# Patient Record
Sex: Female | Born: 1990 | Race: White | Hispanic: No | Marital: Married | Smoking: Former smoker
Health system: Southern US, Community
[De-identification: ages and names within clinical notes are randomized; demographics above are authoritative.]

## PROBLEM LIST (undated history)

## (undated) ENCOUNTER — Emergency Department (HOSPITAL_COMMUNITY): Admission: EM | Disposition: A | Discharge status: Home / Self Care | Payer: Medicaid Other

## (undated) ENCOUNTER — Ambulatory Visit: Admission: EM | Payer: Medicaid Other | Source: Home / Self Care

## (undated) DIAGNOSIS — I10 Essential (primary) hypertension: Secondary | ICD-10-CM

## (undated) DIAGNOSIS — R443 Hallucinations, unspecified: Secondary | ICD-10-CM

## (undated) DIAGNOSIS — R87610 Atypical squamous cells of undetermined significance on cytologic smear of cervix (ASC-US): Secondary | ICD-10-CM

## (undated) DIAGNOSIS — E039 Hypothyroidism, unspecified: Secondary | ICD-10-CM

## (undated) DIAGNOSIS — J45909 Unspecified asthma, uncomplicated: Secondary | ICD-10-CM

## (undated) DIAGNOSIS — T189XXA Foreign body of alimentary tract, part unspecified, initial encounter: Secondary | ICD-10-CM

## (undated) DIAGNOSIS — T17908A Unspecified foreign body in respiratory tract, part unspecified causing other injury, initial encounter: Secondary | ICD-10-CM

## (undated) DIAGNOSIS — M1990 Unspecified osteoarthritis, unspecified site: Secondary | ICD-10-CM

## (undated) DIAGNOSIS — F329 Major depressive disorder, single episode, unspecified: Secondary | ICD-10-CM

## (undated) DIAGNOSIS — E785 Hyperlipidemia, unspecified: Secondary | ICD-10-CM

## (undated) DIAGNOSIS — E221 Hyperprolactinemia: Secondary | ICD-10-CM

## (undated) DIAGNOSIS — F250 Schizoaffective disorder, bipolar type: Secondary | ICD-10-CM

## (undated) DIAGNOSIS — R768 Other specified abnormal immunological findings in serum: Secondary | ICD-10-CM

## (undated) DIAGNOSIS — E079 Disorder of thyroid, unspecified: Secondary | ICD-10-CM

## (undated) DIAGNOSIS — G2401 Drug induced subacute dyskinesia: Secondary | ICD-10-CM

## (undated) DIAGNOSIS — R7689 Other specified abnormal immunological findings in serum: Secondary | ICD-10-CM

## (undated) DIAGNOSIS — K219 Gastro-esophageal reflux disease without esophagitis: Secondary | ICD-10-CM

## (undated) DIAGNOSIS — Z765 Malingerer [conscious simulation]: Secondary | ICD-10-CM

## (undated) DIAGNOSIS — T7840XA Allergy, unspecified, initial encounter: Secondary | ICD-10-CM

## (undated) DIAGNOSIS — F32A Depression, unspecified: Secondary | ICD-10-CM

## (undated) DIAGNOSIS — R1314 Dysphagia, pharyngoesophageal phase: Secondary | ICD-10-CM

## (undated) DIAGNOSIS — E162 Hypoglycemia, unspecified: Secondary | ICD-10-CM

## (undated) DIAGNOSIS — F419 Anxiety disorder, unspecified: Secondary | ICD-10-CM

## (undated) DIAGNOSIS — F4310 Post-traumatic stress disorder, unspecified: Secondary | ICD-10-CM

## (undated) DIAGNOSIS — G43009 Migraine without aura, not intractable, without status migrainosus: Secondary | ICD-10-CM

## (undated) HISTORY — DX: Disorder of thyroid, unspecified: E07.9

## (undated) HISTORY — PX: DIAGNOSTIC LAPAROSCOPY: SUR761

## (undated) HISTORY — DX: Unspecified osteoarthritis, unspecified site: M19.90

## (undated) HISTORY — PX: ABDOMINAL SURGERY: SHX537

## (undated) HISTORY — PX: BREAST LUMPECTOMY: SHX2

## (undated) HISTORY — DX: Unspecified foreign body in respiratory tract, part unspecified causing other injury, initial encounter: T17.908A

## (undated) HISTORY — PX: WISDOM TOOTH EXTRACTION: SHX21

## (undated) HISTORY — PX: APPENDECTOMY: SHX54

## (undated) HISTORY — DX: Allergy, unspecified, initial encounter: T78.40XA

---
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
## 2009-09-24 ENCOUNTER — Emergency Department: Payer: Self-pay | Admitting: Emergency Medicine

## 2009-09-24 IMAGING — CR DG ABDOMEN 1V
1 series · 2 of 2 positions shown · non-contrast
Comparison: none

REASON FOR EXAM: swallow thumb tack this morning
COMMENTS:   LMP: Depo Provera

[Series 1: view not recorded · 0.17mm/px · 2 of 2 slices shown]
[im 1/2]
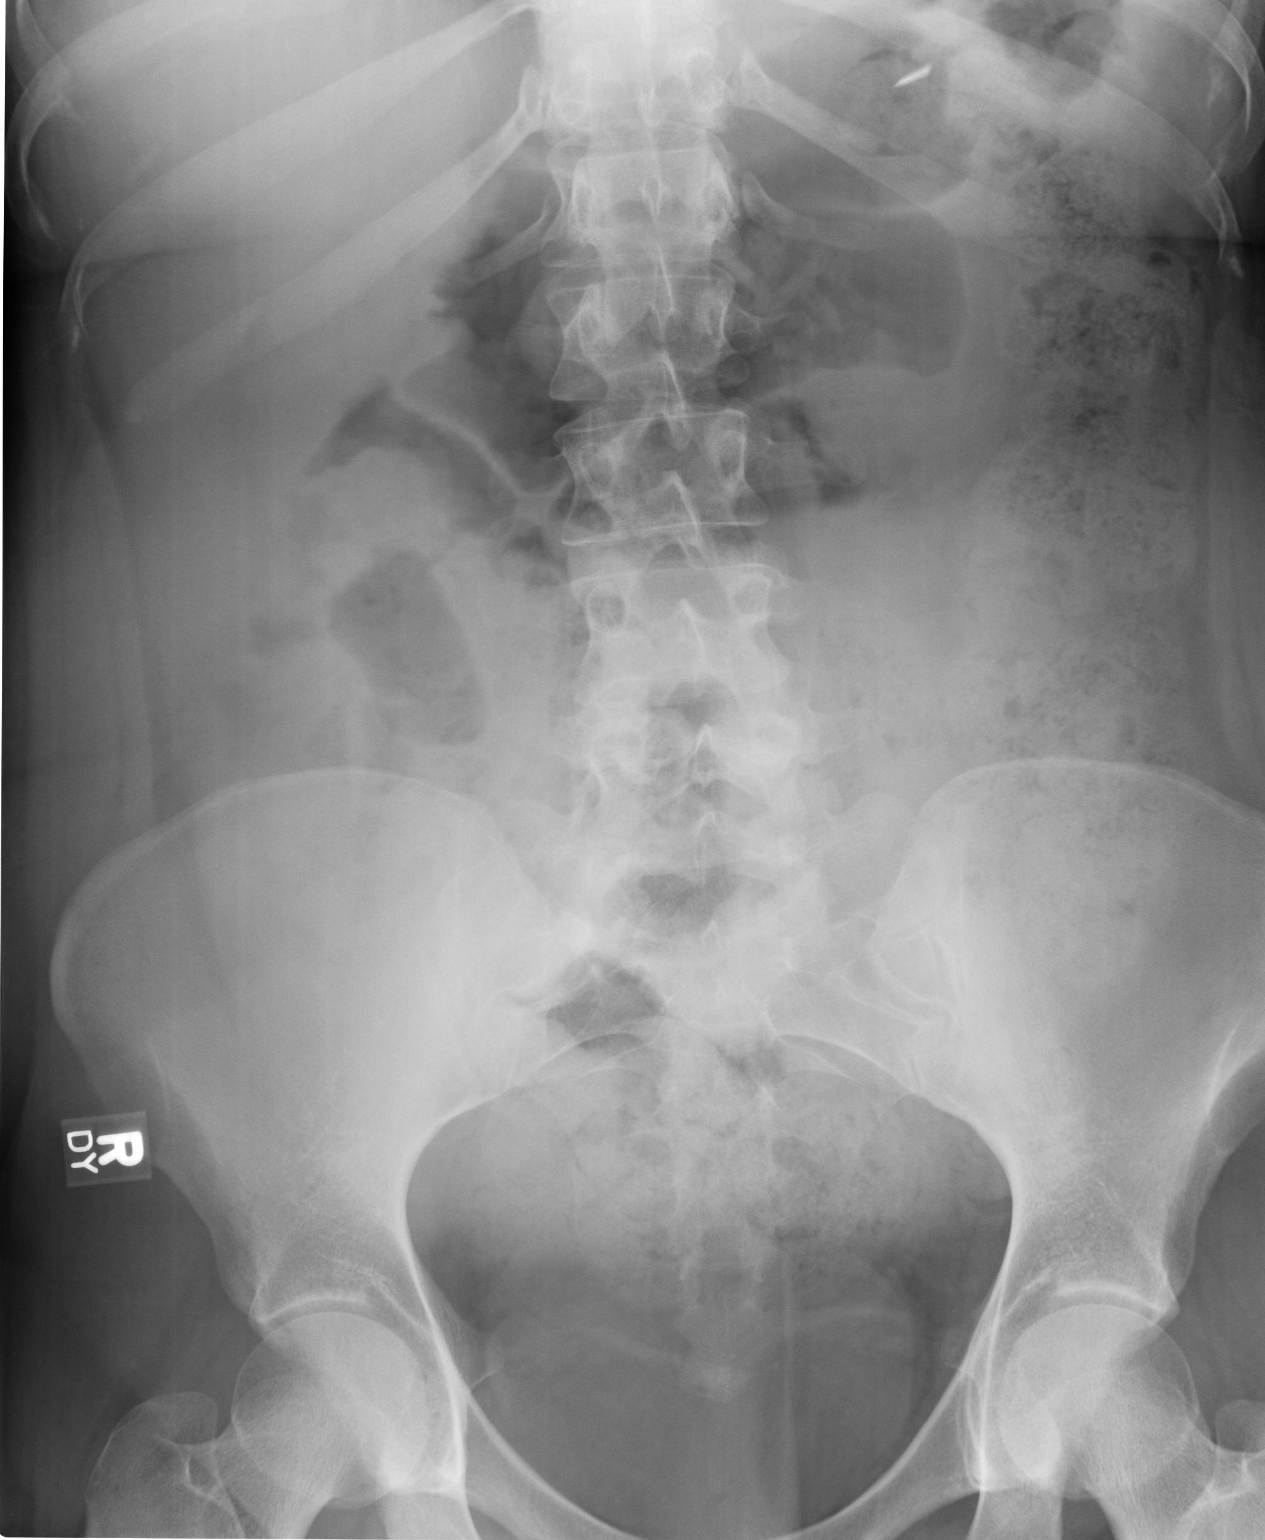
[im 2/2]
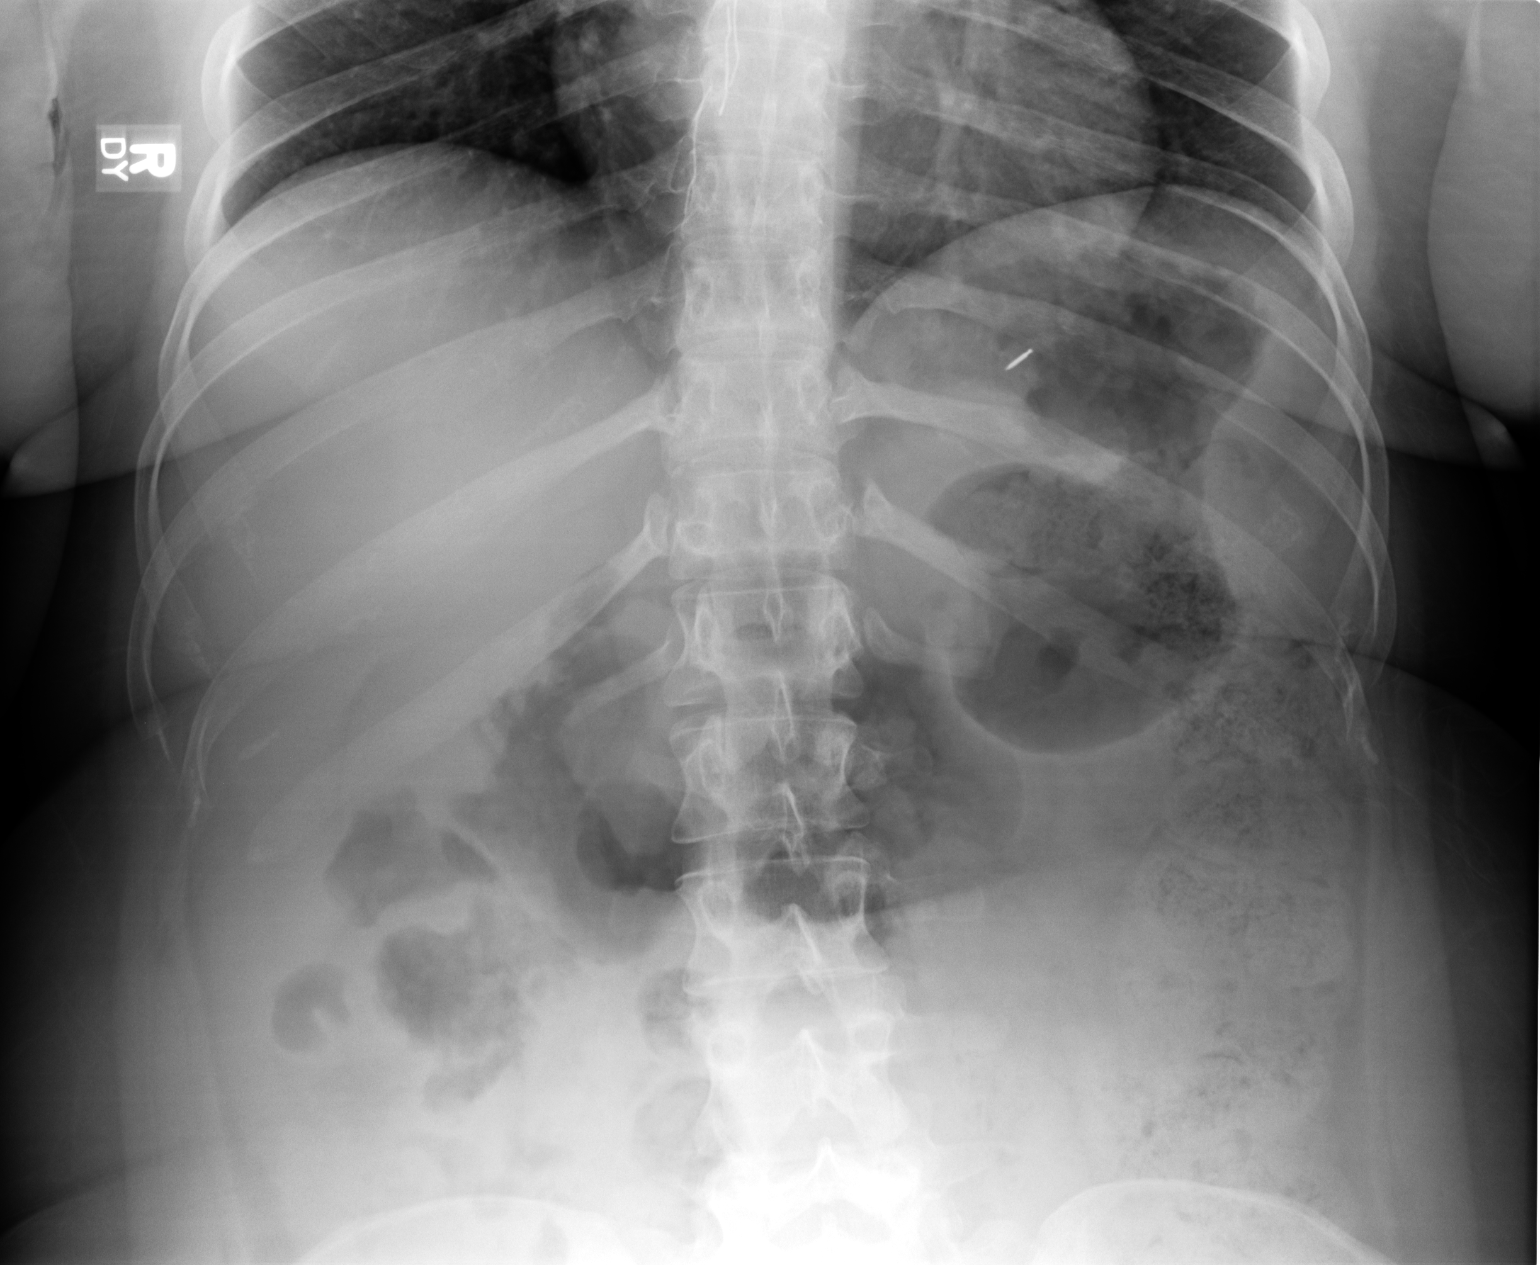

[2 of 2 positions shown; findings below may reference images not displayed]

PROCEDURE:     DXR - DXR KIDNEY URETER BLADDER  - [DATE] [DATE]

RESULT:     An AP view of the abdomen shows a metallic density projected
over the gastric fundus compatible with the clinically reported ingested
foreign body. The bowel gas pattern is normal. There is a moderate amount of
fecal material in the colon. No dilated bowel loops are seen.
IMPRESSION: 1.     There is a metallic opaque object projected over the stomach and
consistent with a tack or tack fragment as mentioned in the clinical history.

## 2009-12-04 ENCOUNTER — Emergency Department: Payer: Self-pay | Admitting: Emergency Medicine

## 2009-12-05 IMAGING — CR DG ABDOMEN 3V
1 series · 3 of 3 positions shown · non-contrast
Comparison: none

REASON FOR EXAM: ingested screw
COMMENTS:

PROCEDURE:     DXR - DXR ABDOMEN 3-WAY (INCL PA CXR)  - [DATE]  [DATE]
RESULT:     Comparisons:  None

[Series 1: view not recorded · 0.17mm/px · 3 of 3 slices shown]
[im 1/3]
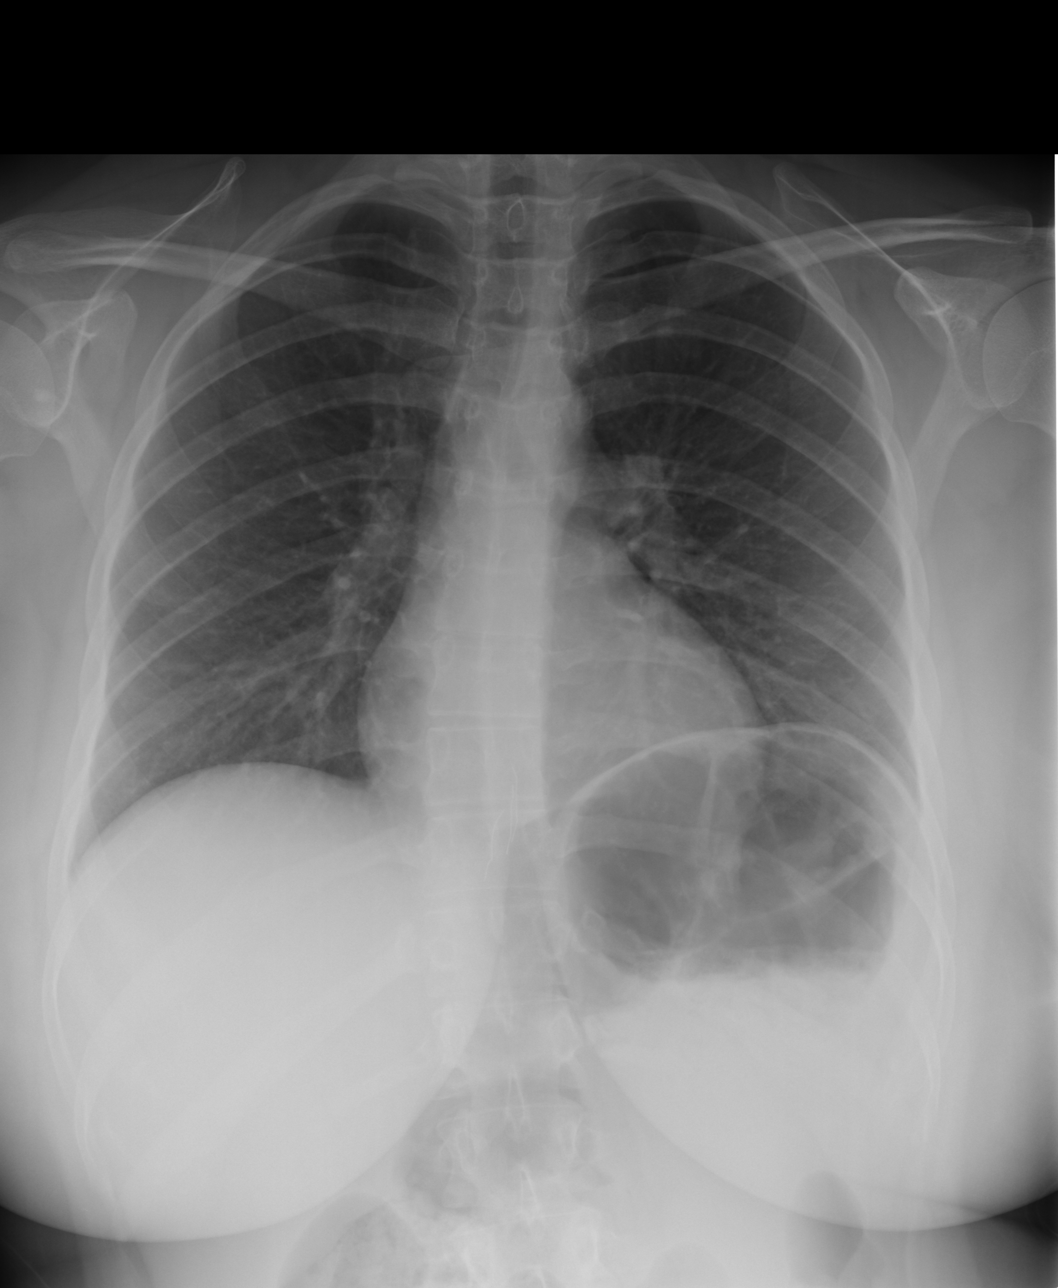
[im 2/3]
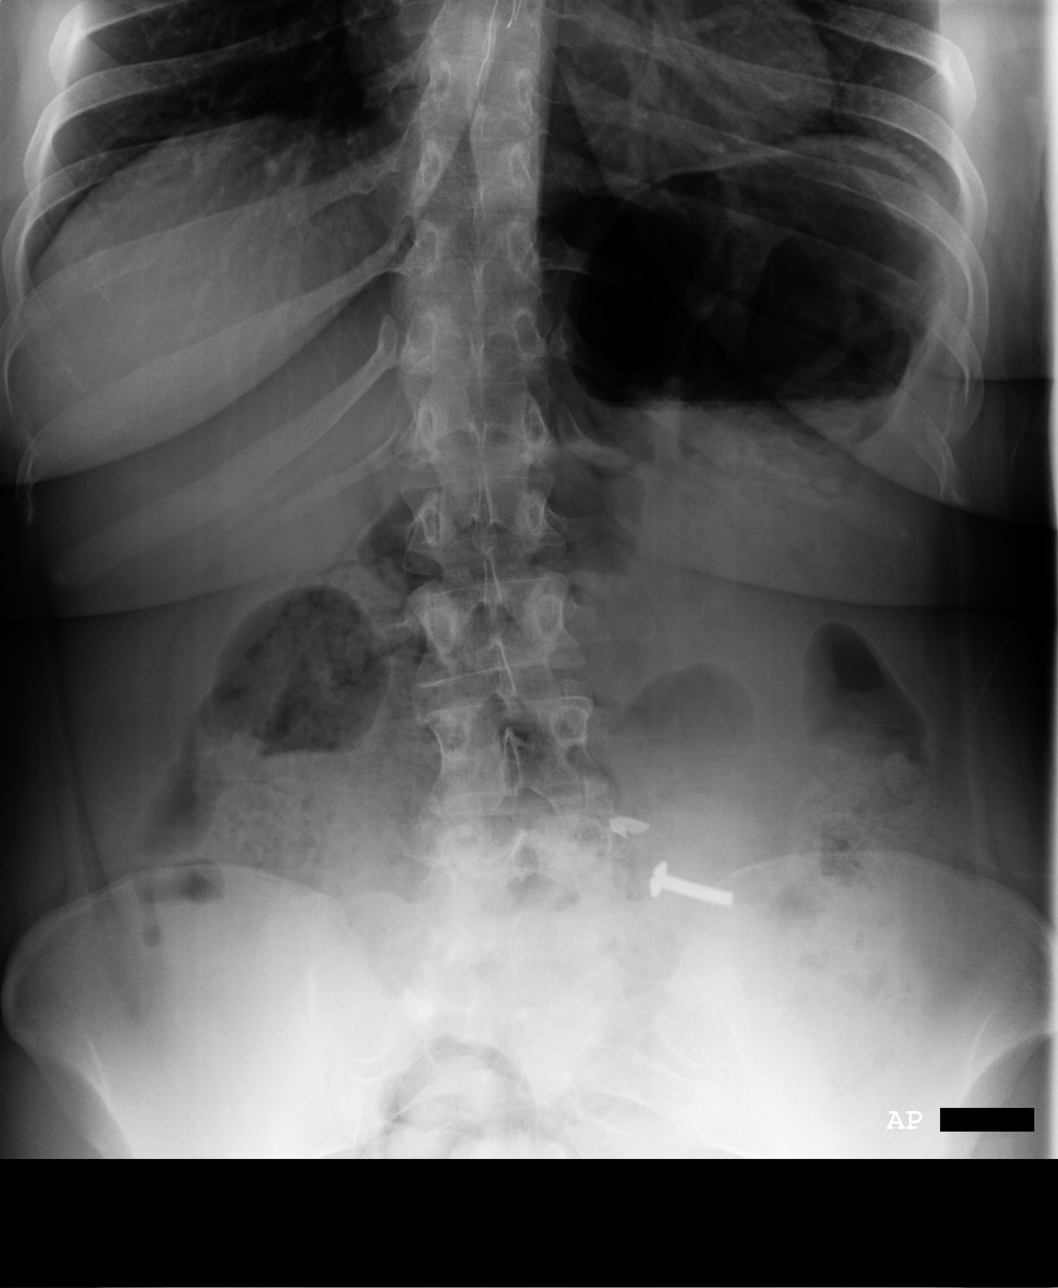
[im 3/3]
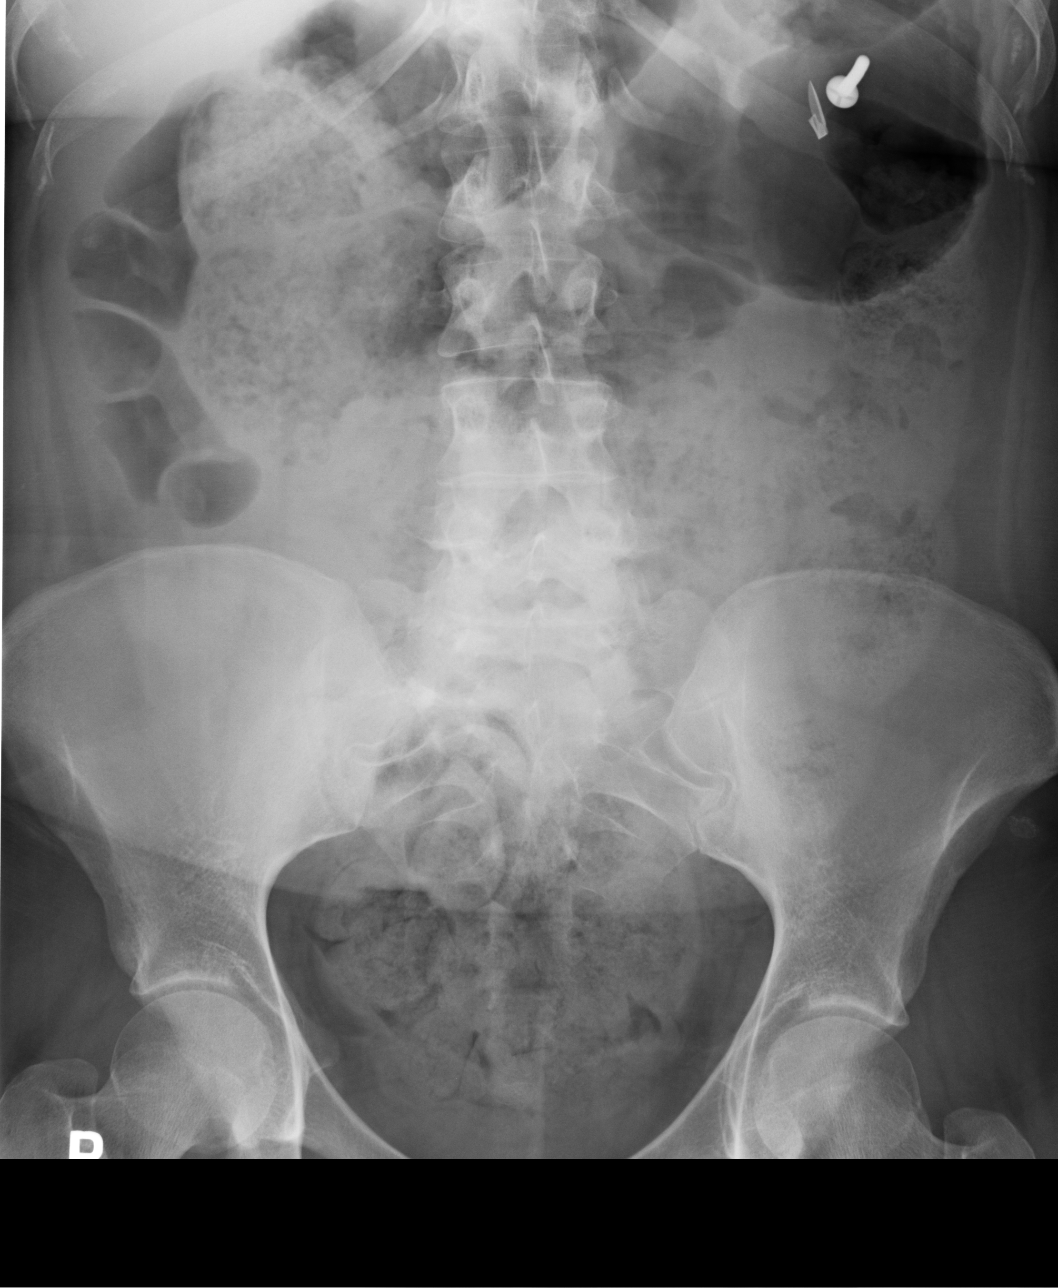

[3 of 3 positions shown; findings below may reference images not displayed]

FINDINGS: PA chest, supine and upright views of the abdomen are provided.

The chest is clear. The heart and mediastinum are unremarkable. The osseous
structures are unremarkable.

There is a nonspecific bowel gas pattern. There is no bowel dilatation to
suggest obstruction. There are no air-fluid levels. There are no dilated
loops, bowel wall thickening, or evidence of mass effect.  Soft tissue
shadows are normal. There is no evidence of pneumoperitoneum, portal venous
gas, or pneumatosis. There is an approximately 2.8 cm long fully threaded
screw in the left lower abdomen on the upright radiograph and in the region
of the splenic flexure on the supine radiograph. Additionally there is a and
metallic foreign body adjacent to the screw measuring 1.9 cm.

Osseous structures are unremarkable.
IMPRESSION: There is an approximately 2.8 cm long fully threaded screw in the left lower
abdomen on the upright radiograph and in the region of the splenic flexure
on the supine radiograph. Additionally there is a and metallic foreign body
adjacent to the screw measuring 1.9 cm.

## 2009-12-29 IMAGING — CR DG ABDOMEN 3V
1 series · 3 of 3 positions shown · non-contrast
Comparison: none

REASON FOR EXAM: swallowed a razor blade
COMMENTS:

[Series 1: view not recorded · 0.17mm/px · 3 of 3 slices shown]
[im 1/3]
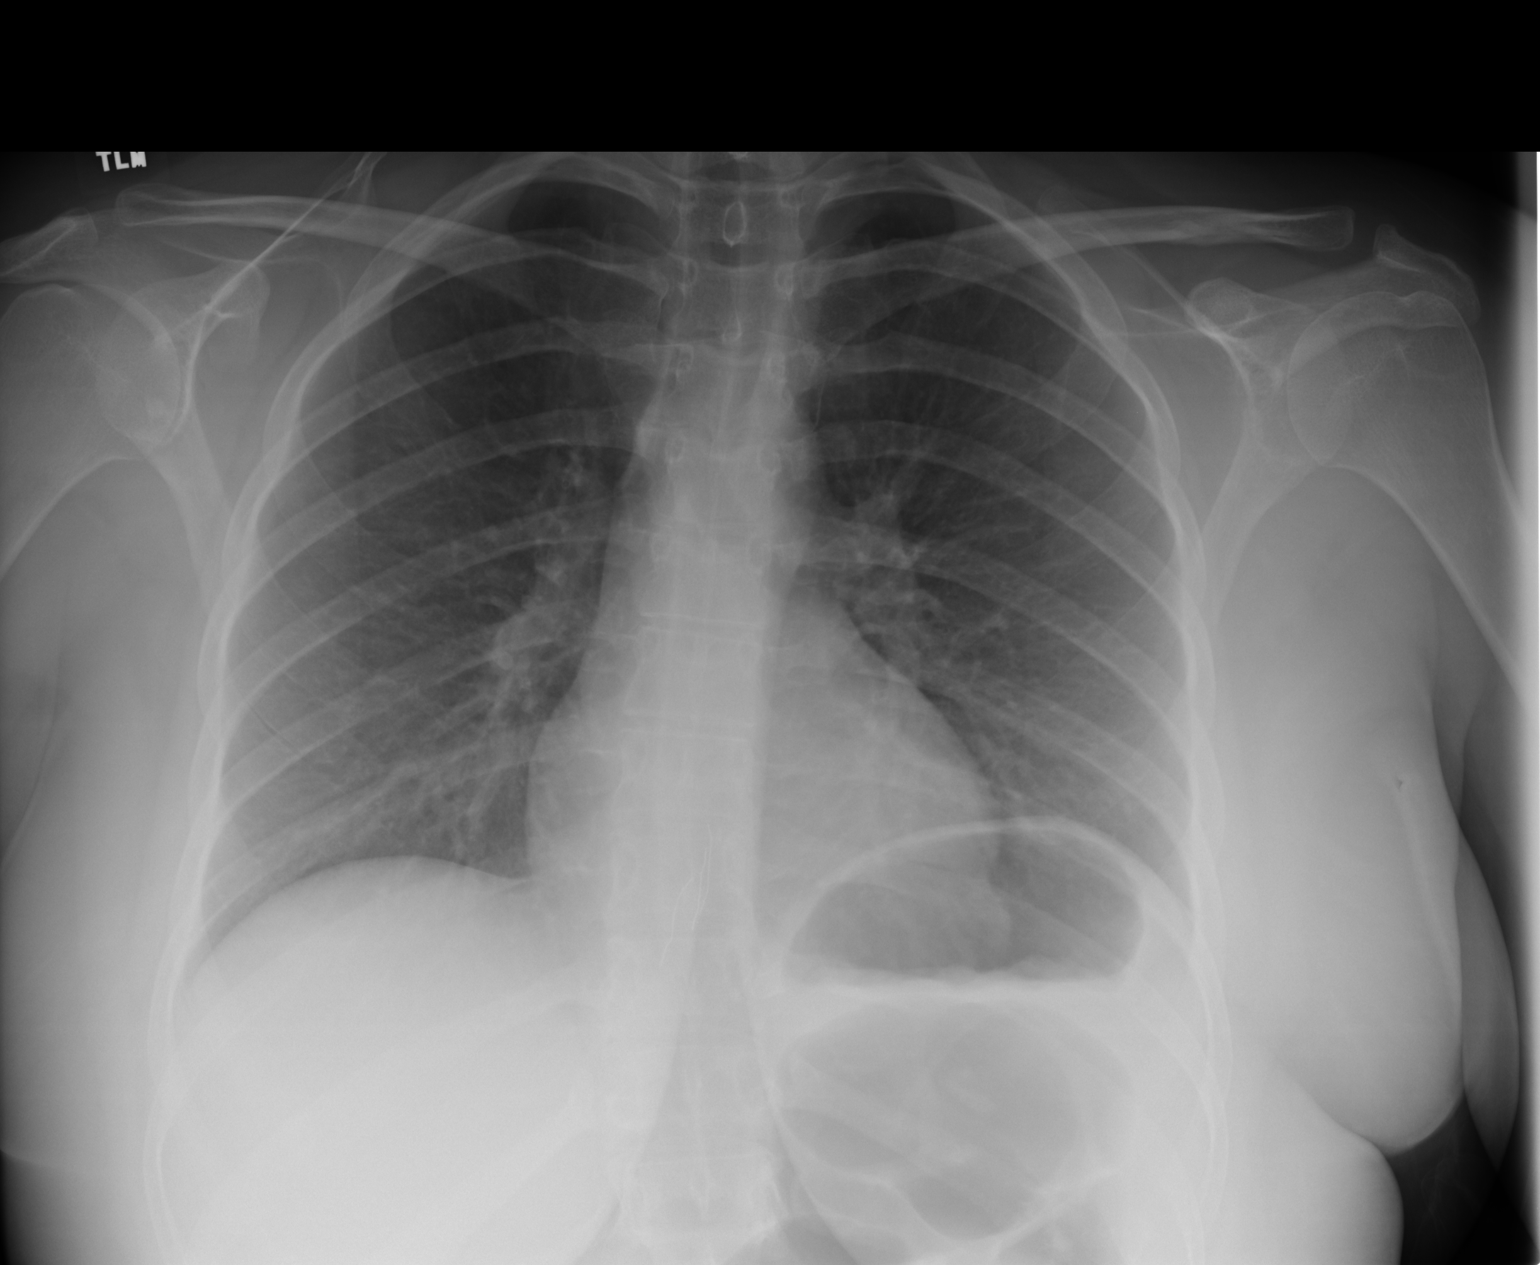
[im 2/3]
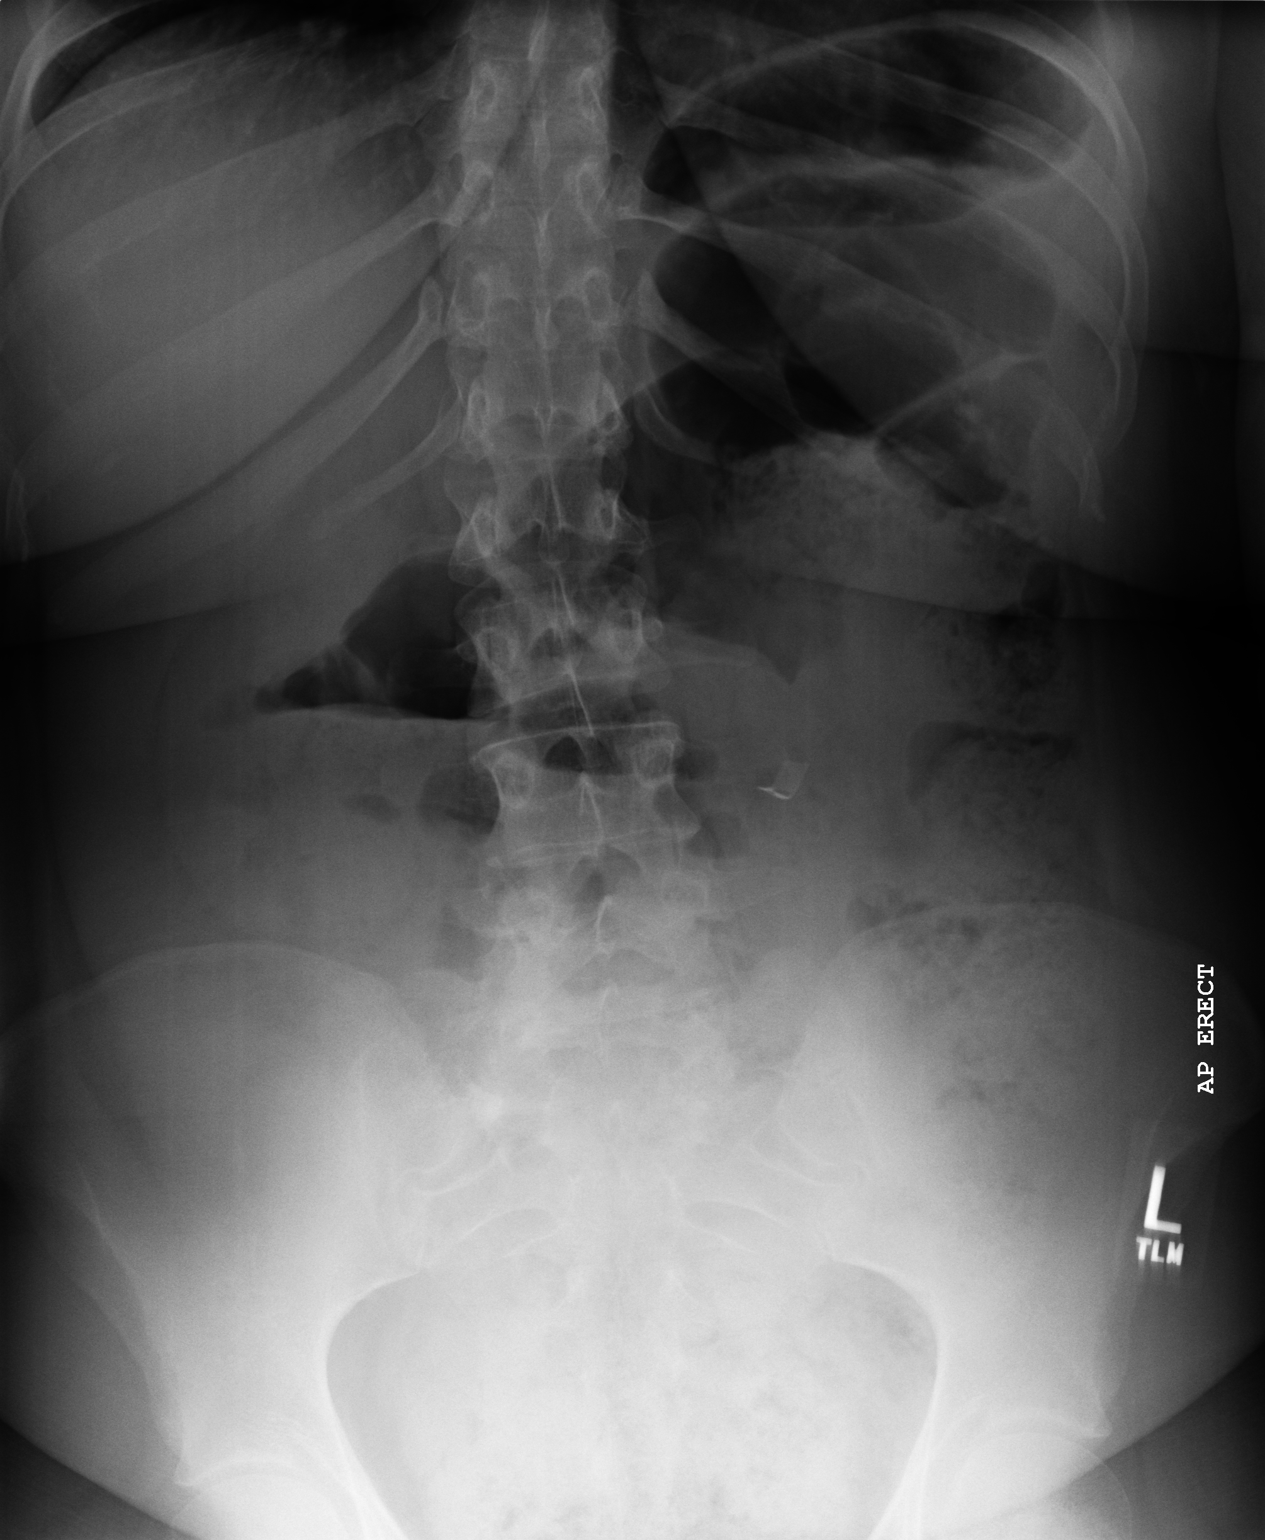
[im 3/3]
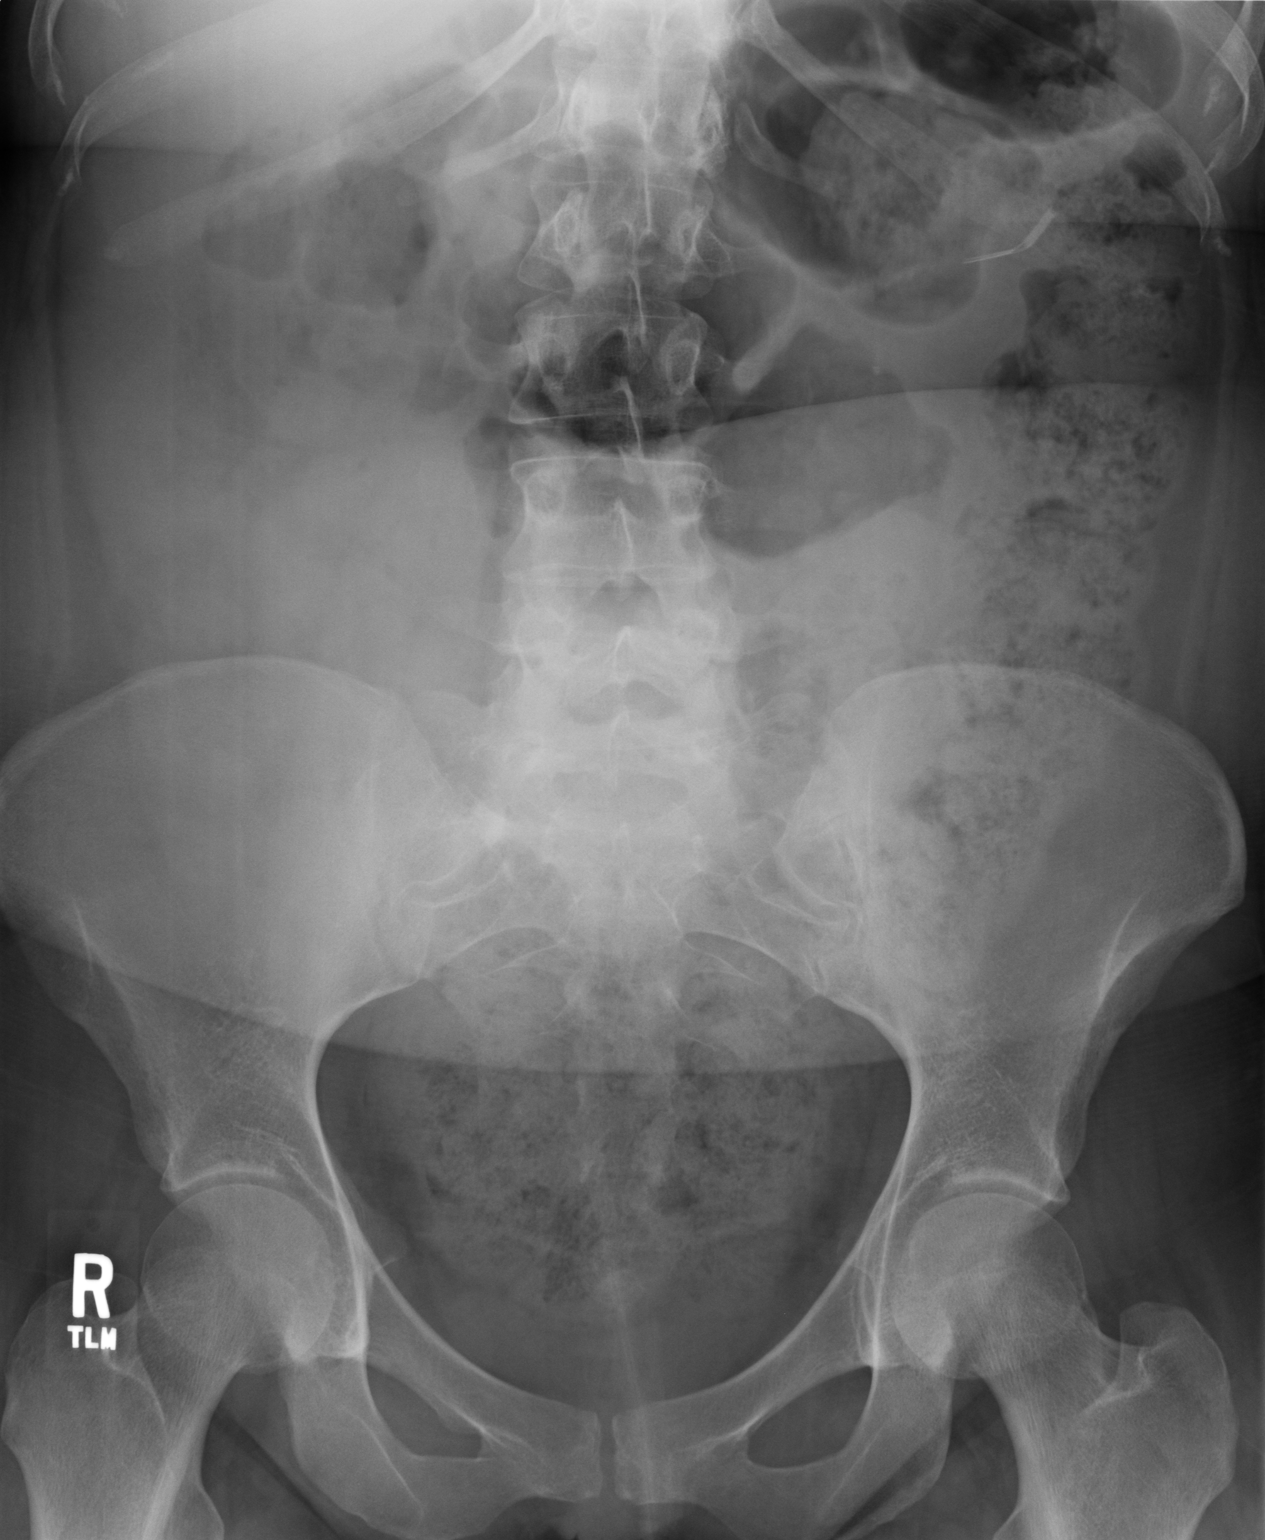

[3 of 3 positions shown; findings below may reference images not displayed]

PROCEDURE:     DXR - DXR ABDOMEN 3-WAY (INCL PA CXR)  - [DATE] [DATE]

RESULT:     Comparison is made to prior the exam [DATE]. PA view of the
chest shows the lung fields to be clear. No subdiaphragmatic free air is
seen. Two views of the abdomen were obtained. The previously observed
metallic screw projected over the left upper quadrant is no longer seen. The
second foreign body persists and in the supine view is again projected at
the level of the splenic flexure.
IMPRESSION: 1. There is a metallic density foreign body projected at the level of the
splenic flexure.
2. The previously present metallic screw is no longer seen.
3. No subdiaphragmatic free air is noted.

## 2009-12-30 ENCOUNTER — Inpatient Hospital Stay: Payer: Self-pay | Admitting: Internal Medicine

## 2009-12-30 IMAGING — CR DG ABDOMEN 3V
1 series · 4 of 4 positions shown · non-contrast
Comparison: none

REASON FOR EXAM: f/u foreign body ingested
COMMENTS:

[Series 1: view not recorded · 0.17mm/px · 4 of 4 slices shown]
[im 1/4]
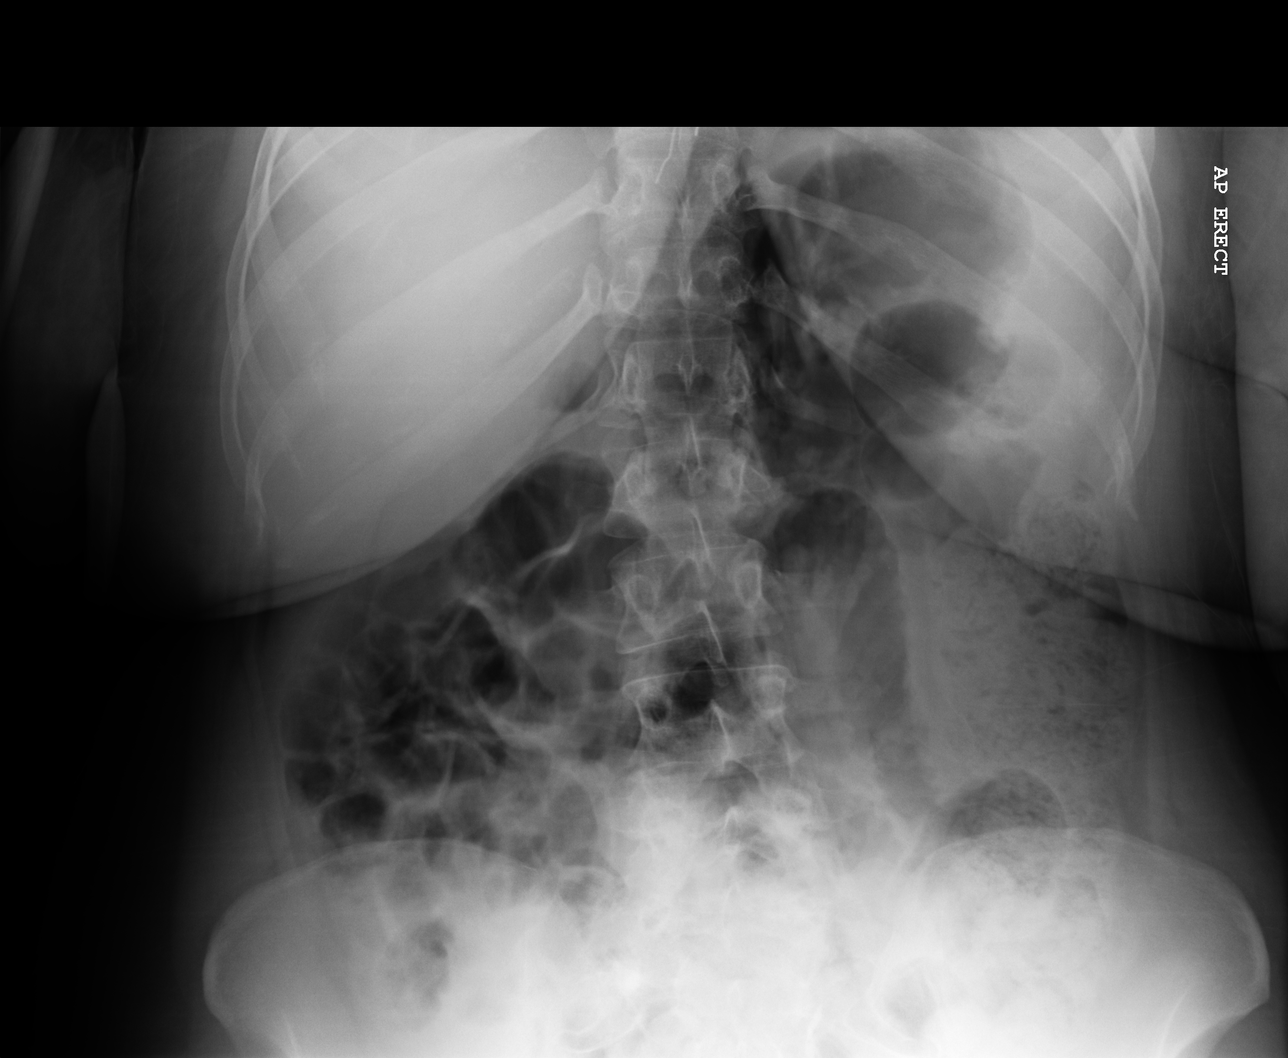
[im 2/4]
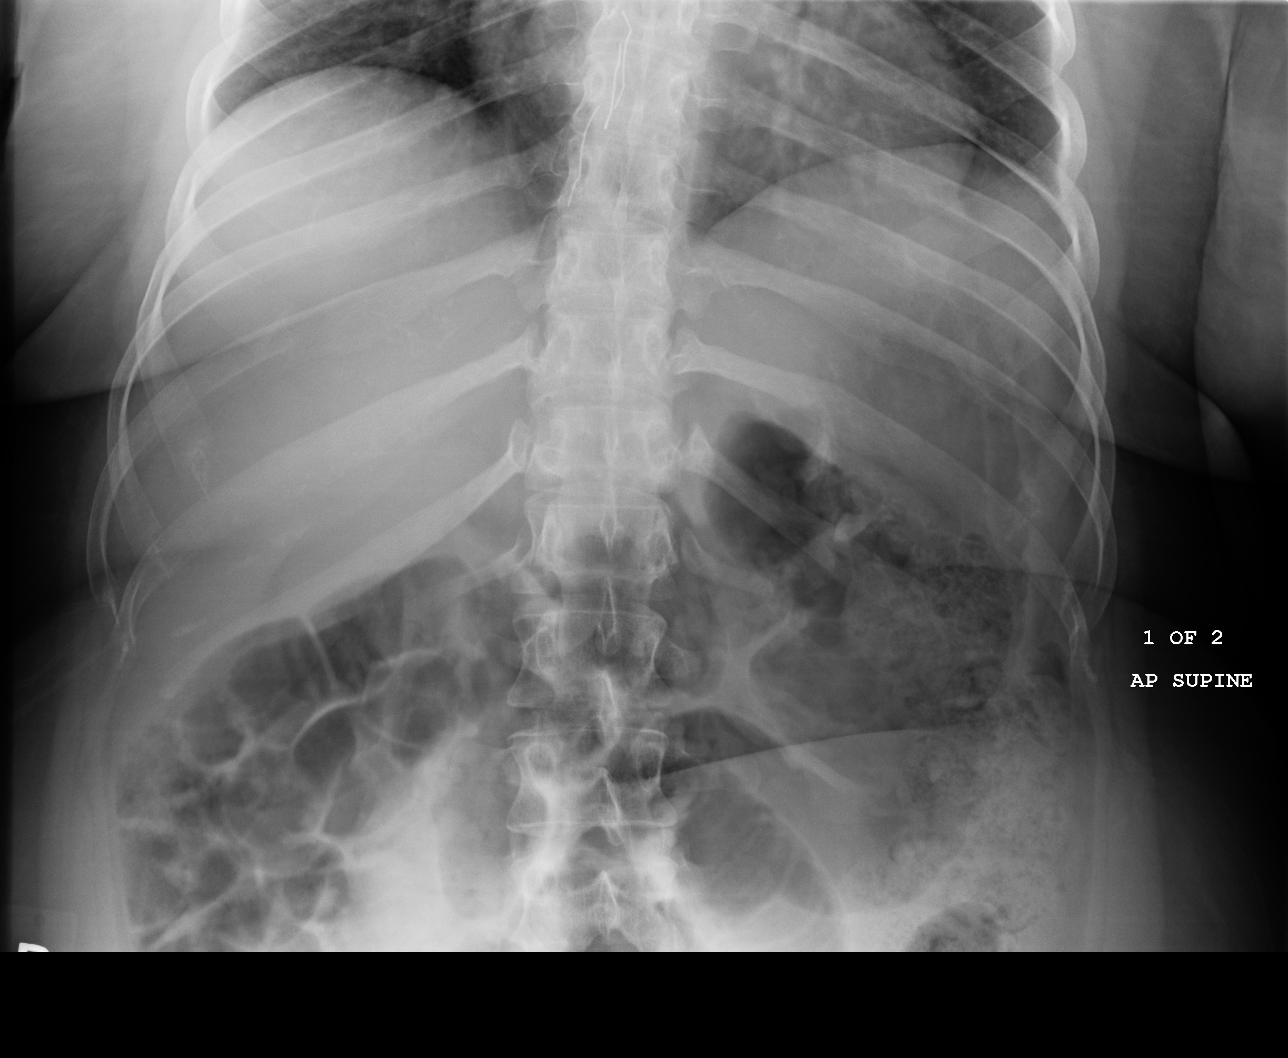
[im 3/4]
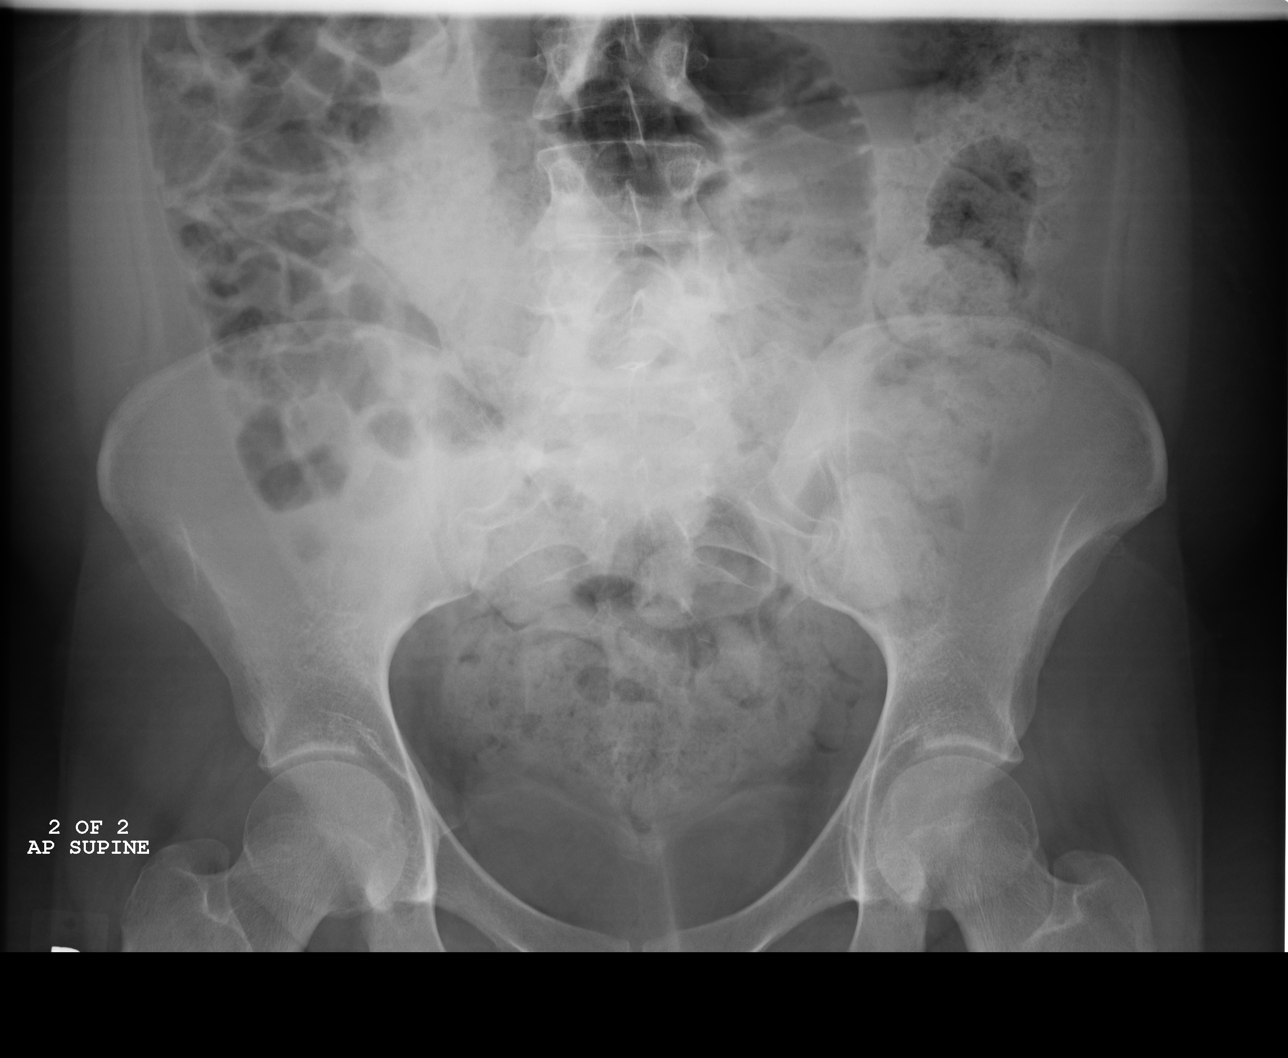
[im 4/4]
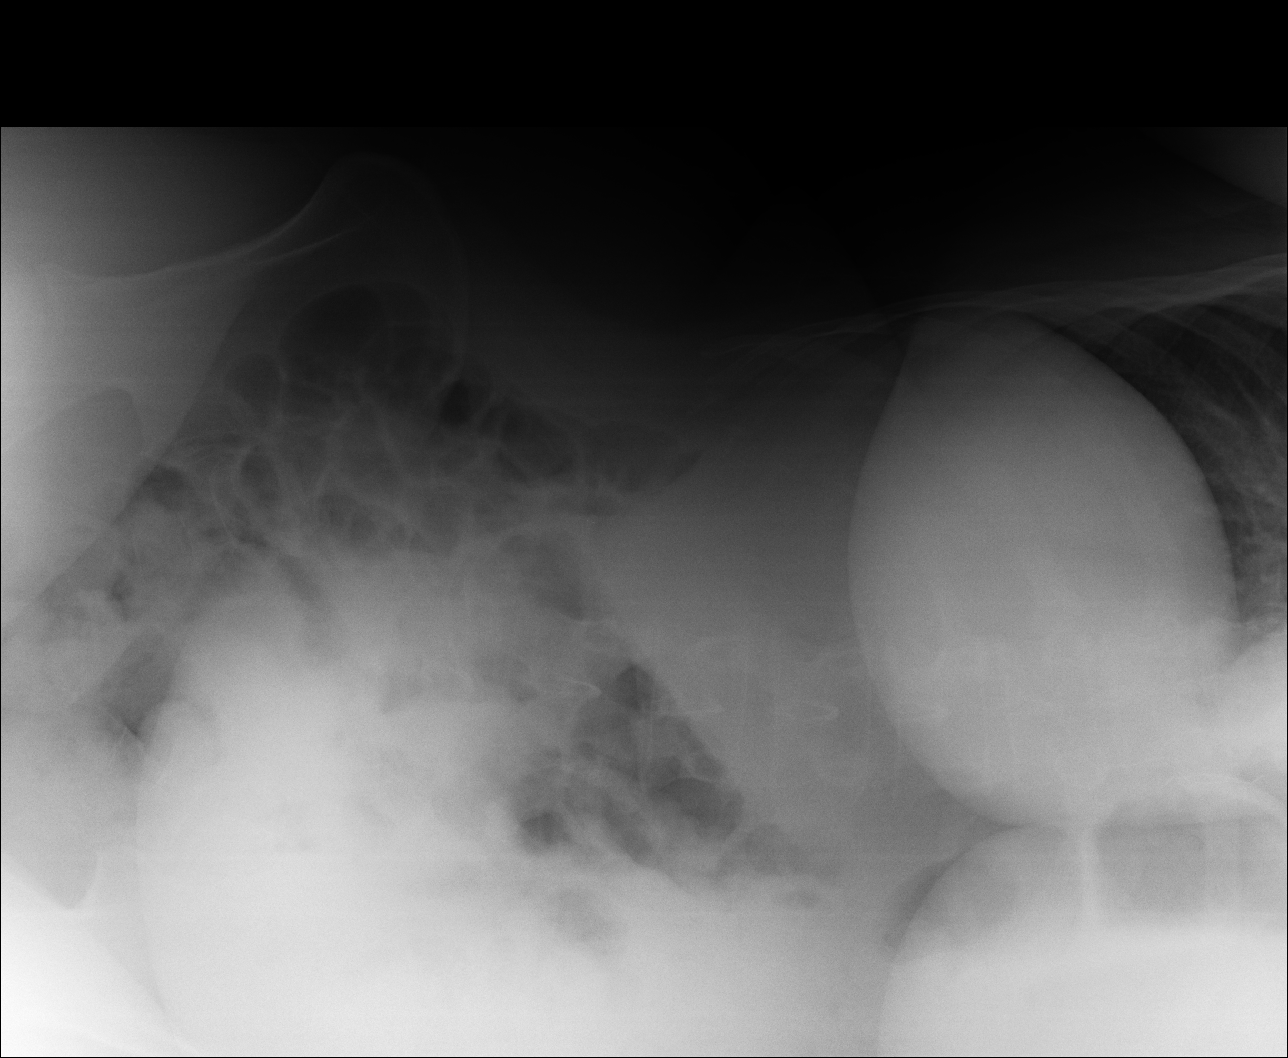

[4 of 4 positions shown; findings below may reference images not displayed]

PROCEDURE:     DXR - DXR ABDOMEN COMPLETE  - [DATE]  [DATE]

RESULT:     No subdiaphragmatic free air is seen. The bowel gas pattern is
normal. No air-fluid levels are seen. The radiopaque foreign body in the GI
tract noted on prior exams is not definitely seen but still may be present
and obscured by bowel and bowel content and respiratory motion. Additional
follow-up is recommended.
IMPRESSION: 1.     The previously noted foreign body is not identified on this exam but
additional follow-up is recommended since the finding could be present and
obscured as noted above.

## 2009-12-30 IMAGING — CR DG ABDOMEN 3V
1 series · 3 of 3 positions shown · non-contrast
Comparison: none

REASON FOR EXAM: foreign body
COMMENTS:

[Series 1: view not recorded · 0.17mm/px · 3 of 3 slices shown]
[im 1/3]
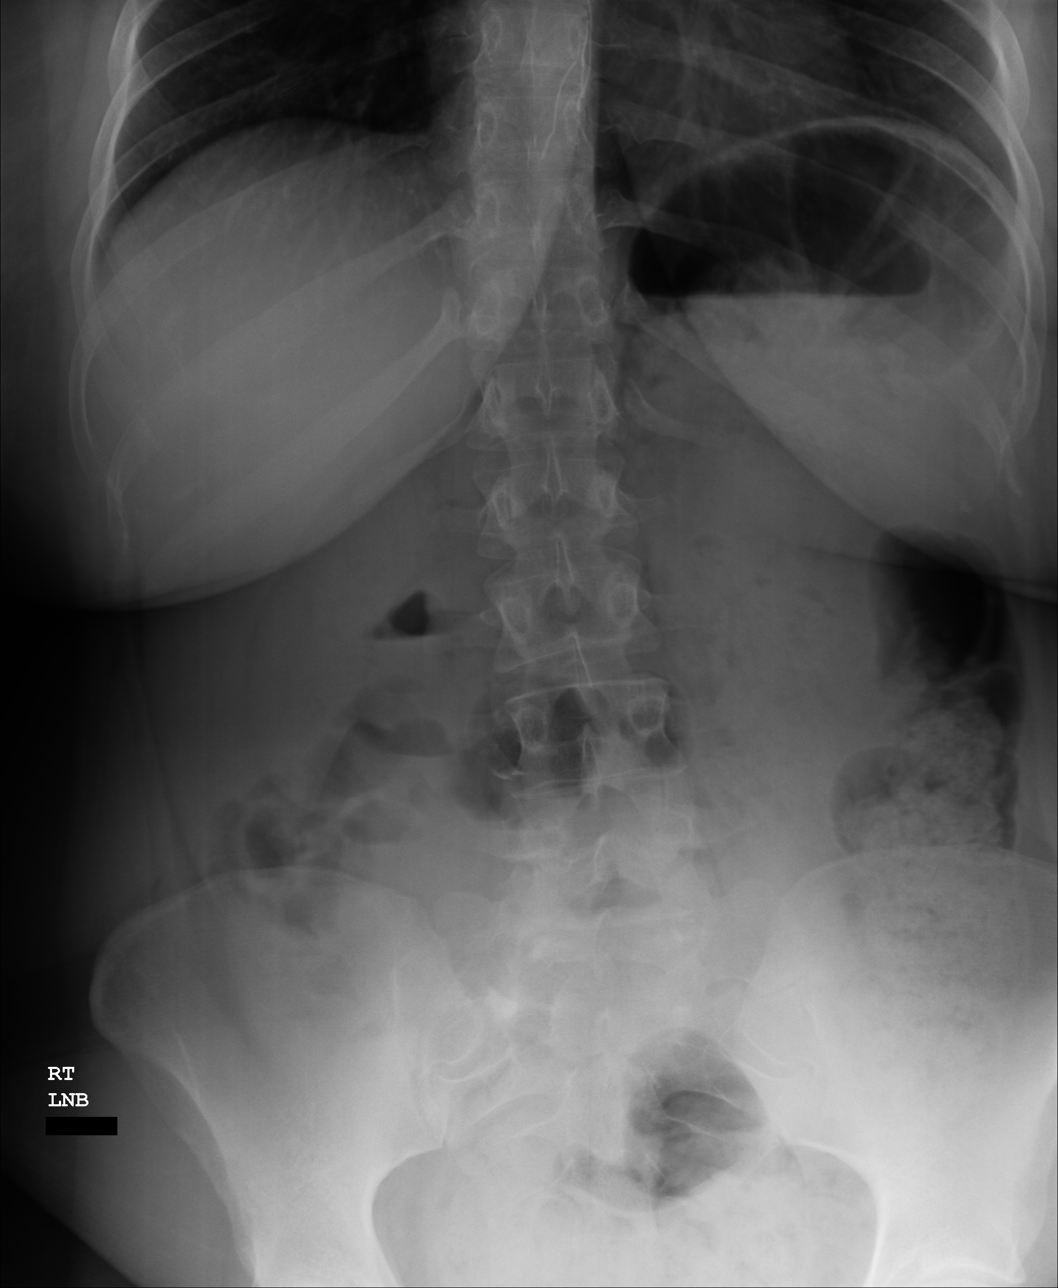
[im 2/3]
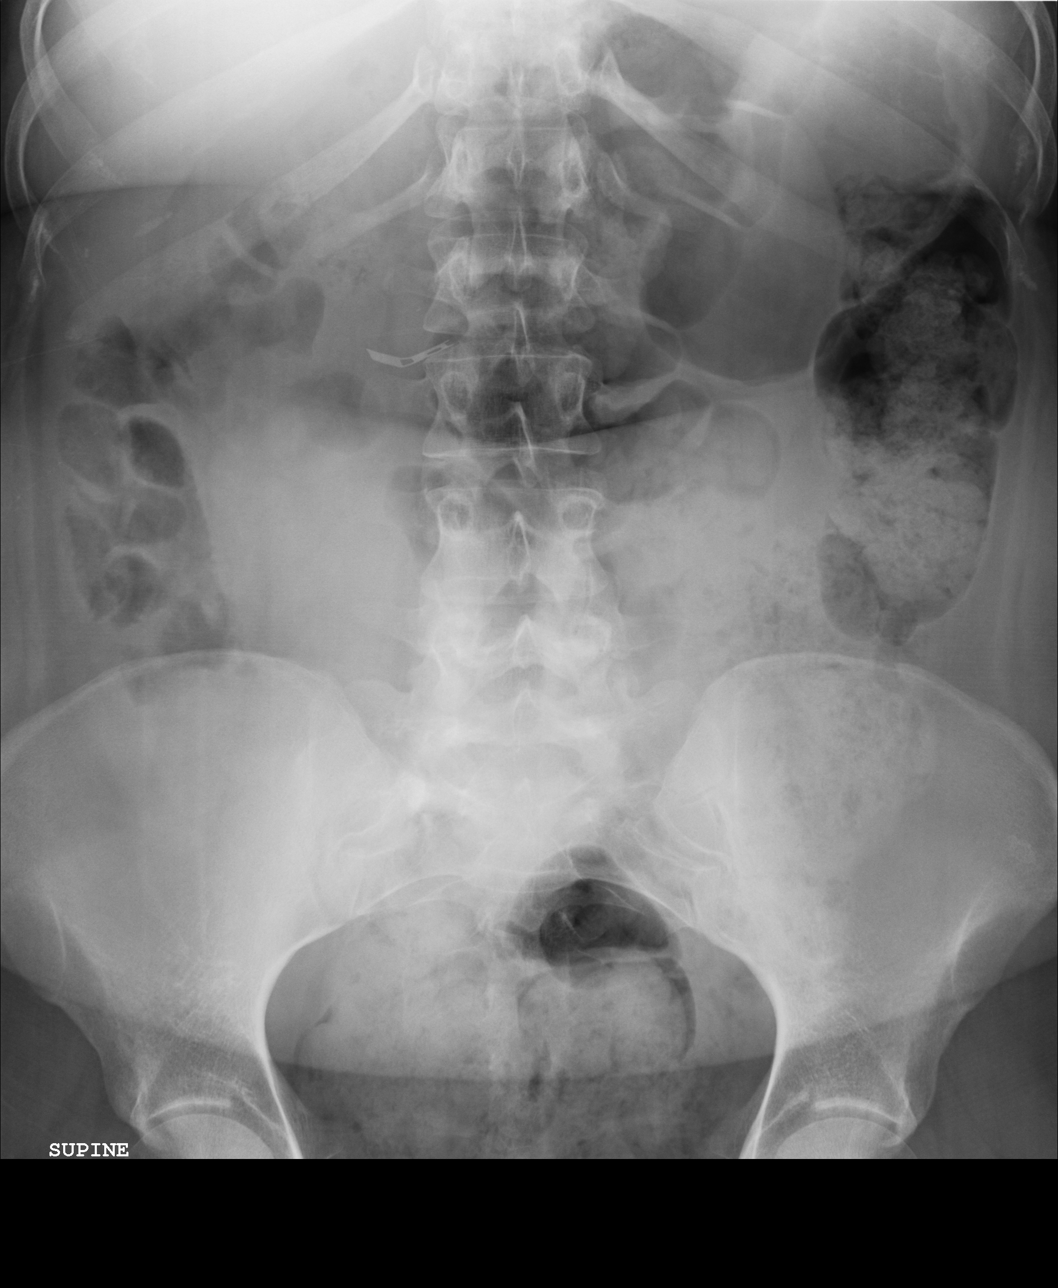
[im 3/3]
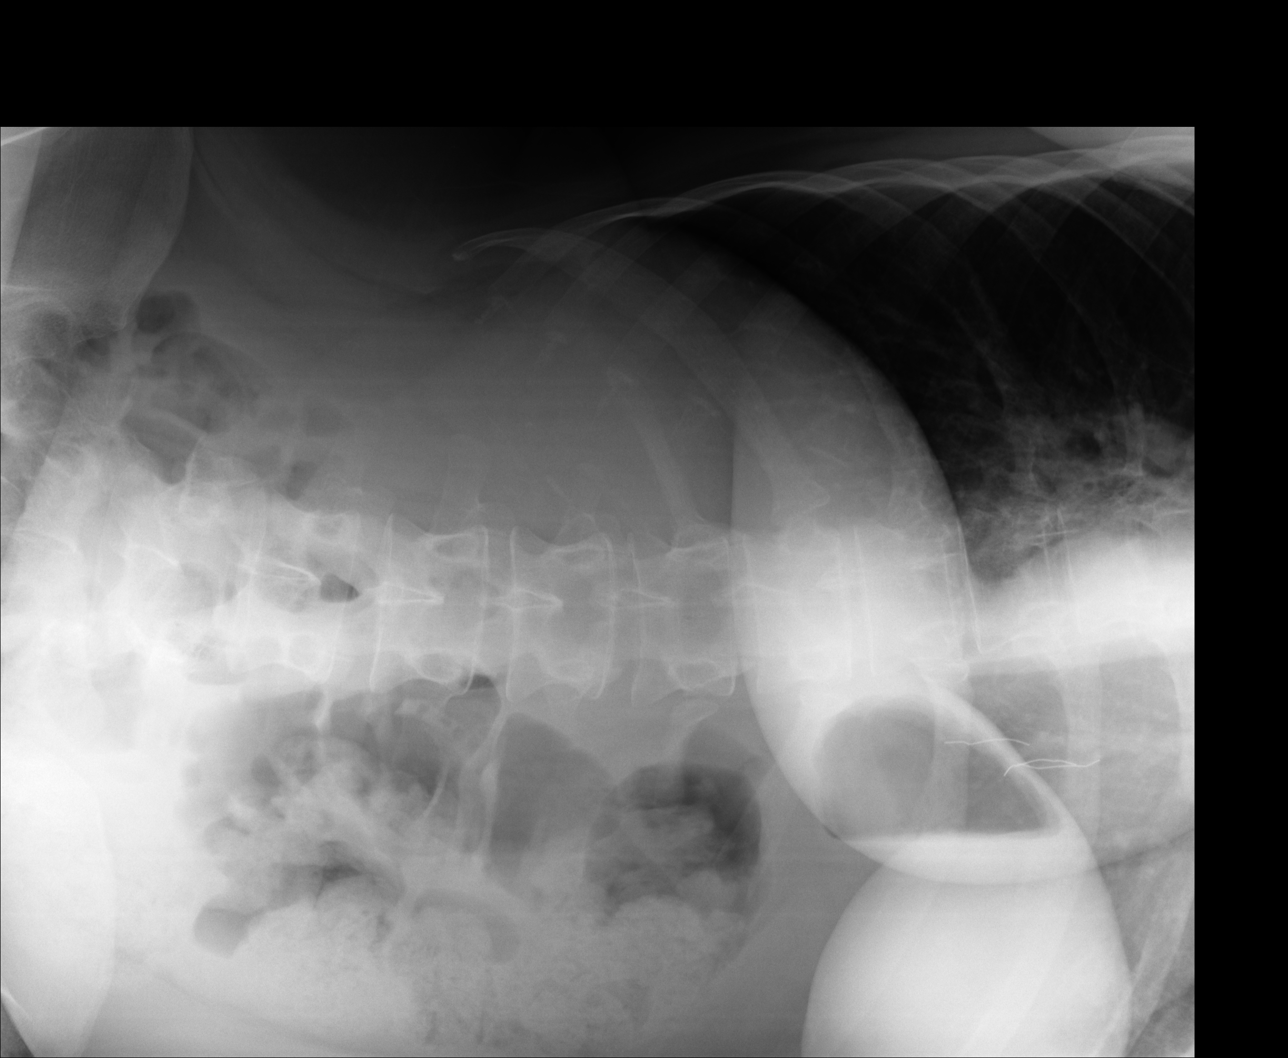

[3 of 3 positions shown; findings below may reference images not displayed]

PROCEDURE:     DXR - DXR ABDOMEN COMPLETE  - [DATE] [DATE]

RESULT:     Flat, erect and lateral decubitus views of the abdomen were
obtained and are compared to the prior exam of yesterday. No
subdiaphragmatic free air is seen. The previously noted metallic foreign
body is again observed. On this examination, the foreign body is projected
over the medial aspect of the right upper quadrant which suggests the
finding is in the stomach or duodenum. The finding was previously in the
left upper quadrant and may have been in the gastric fundus. The metallic
screw previously noted is no longer seen as reported on the exam of
yesterday.
IMPRESSION: 1. The previously noted foreign body is again observed and is currently
located medially in the right upper quadrant.
2. No subdiaphragmatic free air is seen.
3. There is a moderate amount of fecal material in the colon.

## 2009-12-31 IMAGING — CR DG ABDOMEN 3V
1 series · 4 of 4 positions shown · non-contrast
Comparison: none

REASON FOR EXAM: FB ingestion, acute increase in abdominal pain.
COMMENTS:

[Series 1: view not recorded · 0.17mm/px · 4 of 4 slices shown]
[im 1/4]
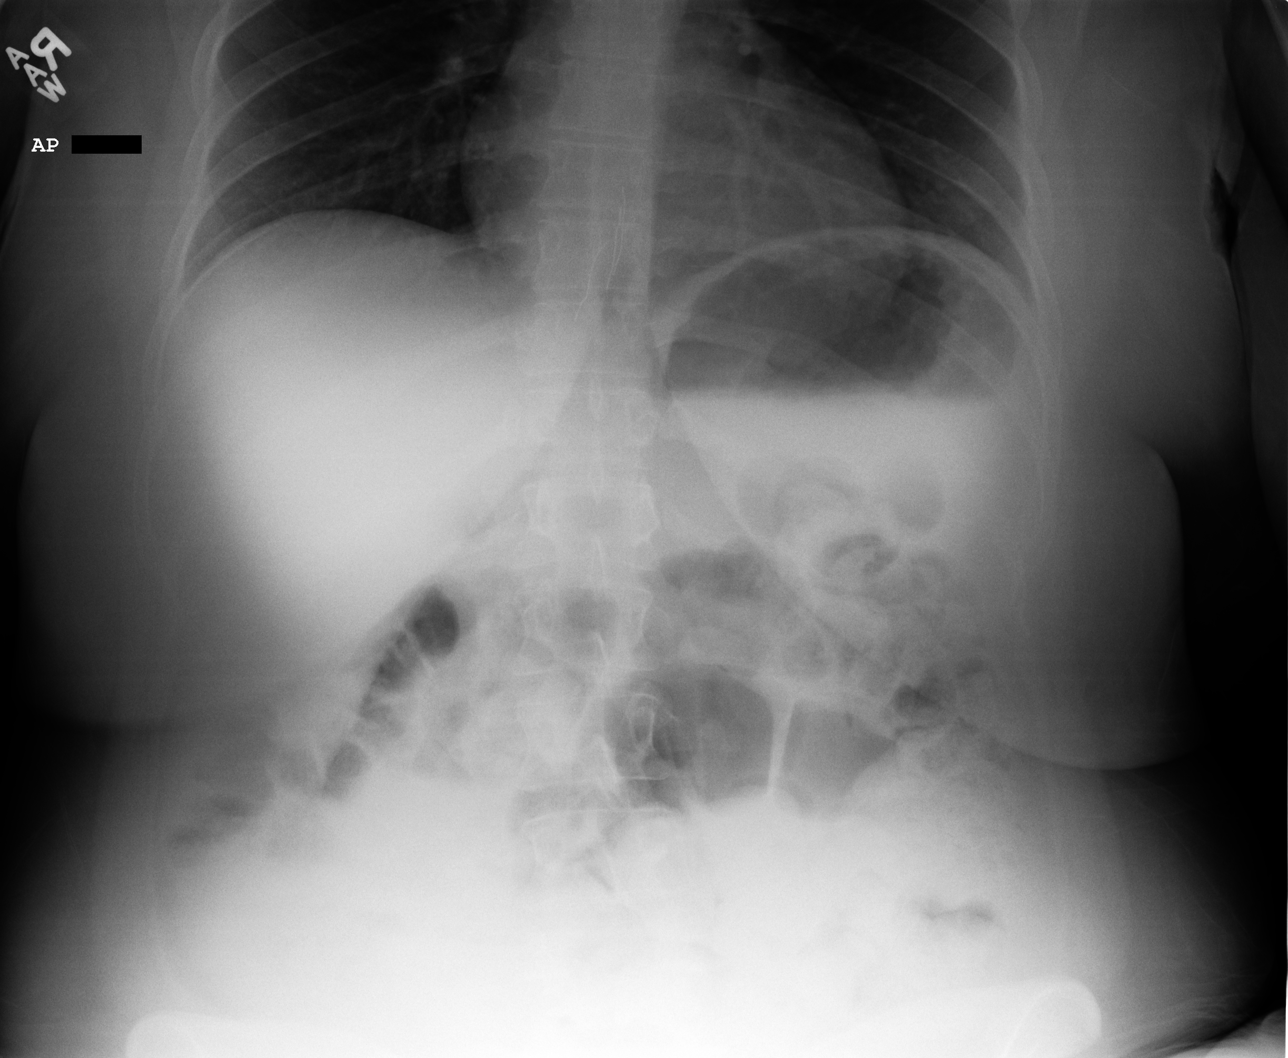
[im 2/4]
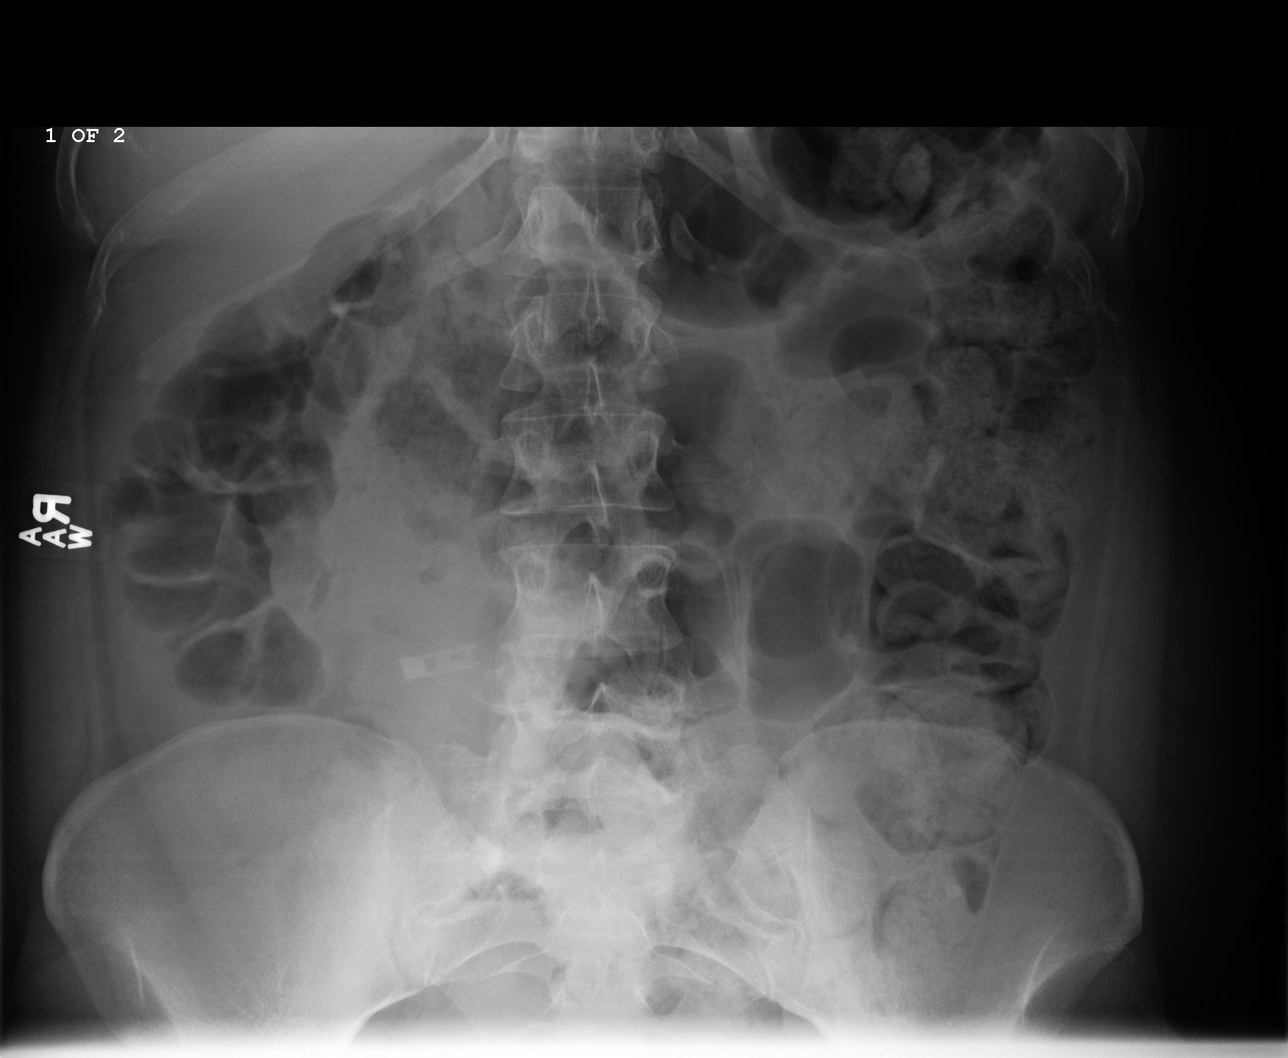
[im 3/4]
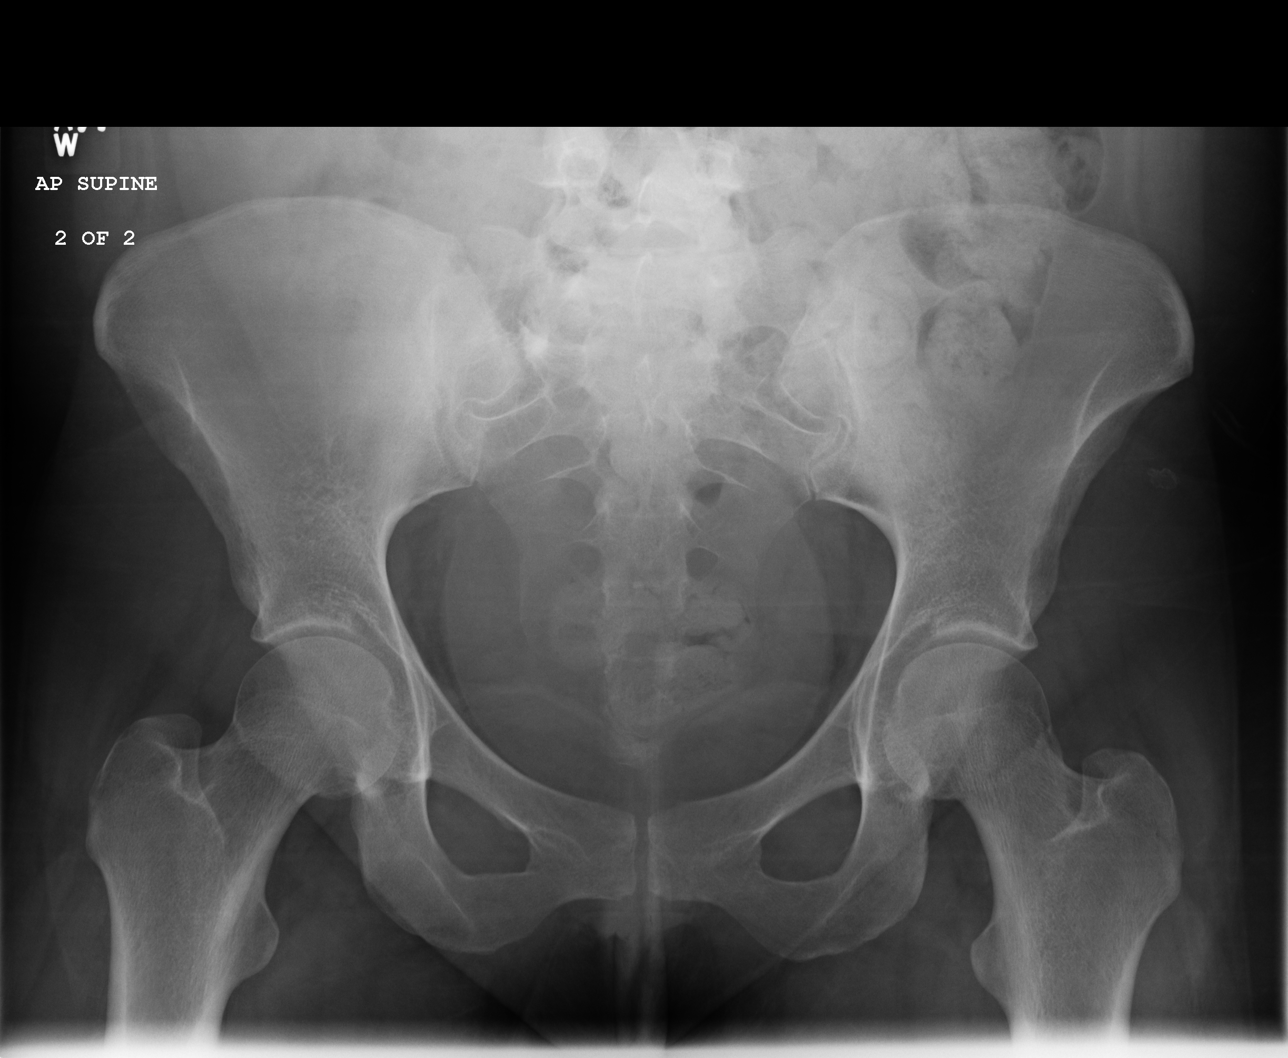
[im 4/4]
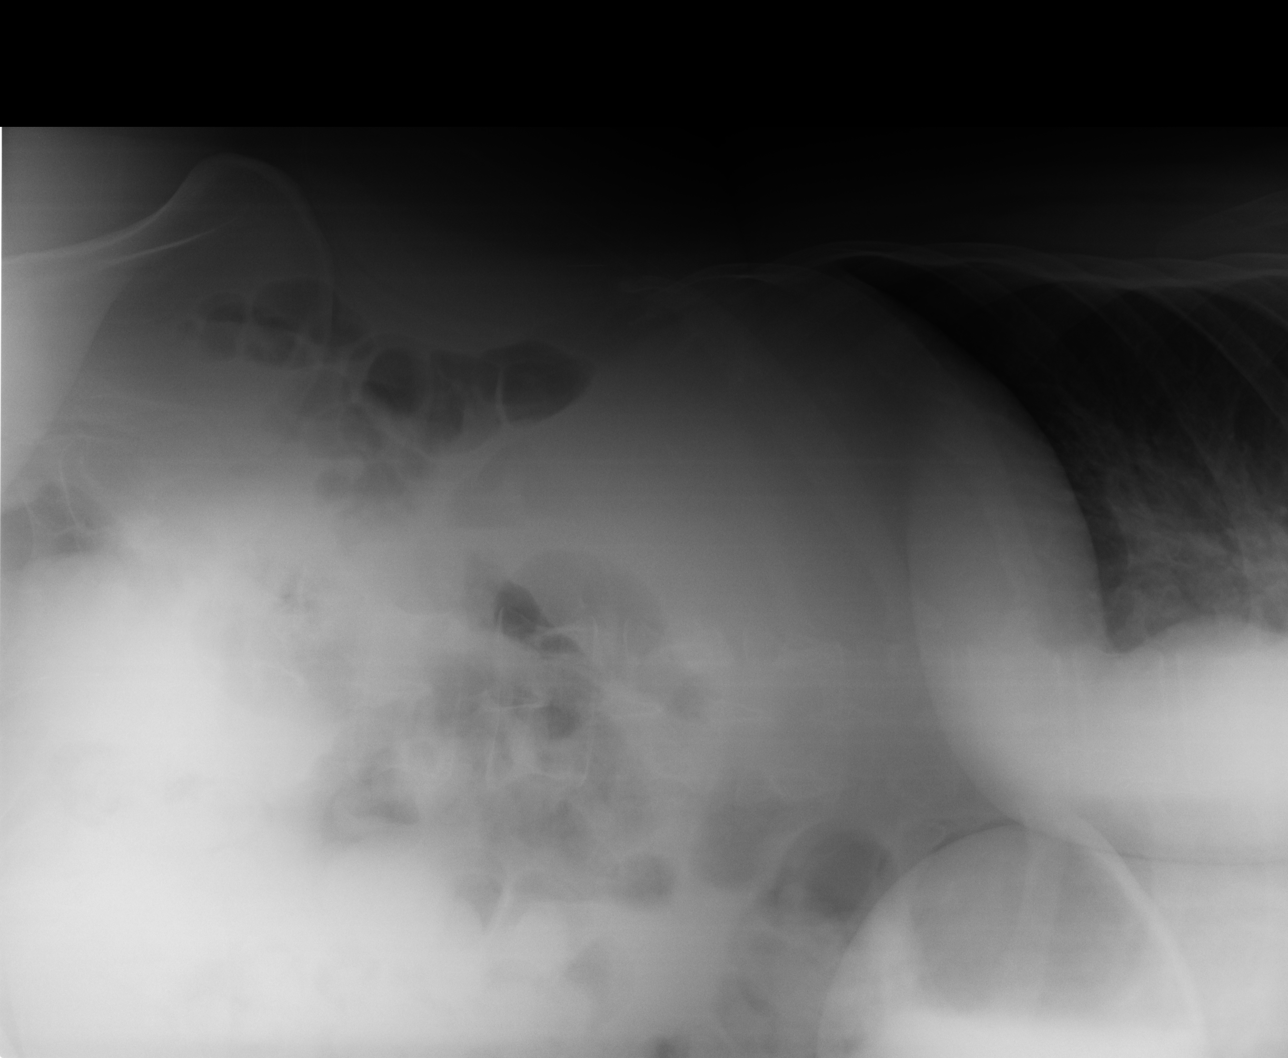

[4 of 4 positions shown; findings below may reference images not displayed]

PROCEDURE:     DXR - DXR ABDOMEN COMPLETE  - [DATE] [DATE]

RESULT:     Erect and supine views of the abdomen were obtained and are
compared to prior examinations. No subdiaphragmatic free air is seen. There
is a metallic density visualized in the right lower quadrant and consistent
with the ingested foreign body mentioned in the clinical history. It is to
me uncertain whether the foreign body is in small bowel or colon.
IMPRESSION: 1.     Foreign body is observed projected over the right lower quadrant as
noted above.

## 2010-01-01 IMAGING — CR DG ABDOMEN 3V
1 series · 4 of 4 positions shown · non-contrast
Comparison: none

REASON FOR EXAM: foreign body ingestion
COMMENTS:

[Series 1: view not recorded · 0.17mm/px · 4 of 4 slices shown]
[im 1/4]
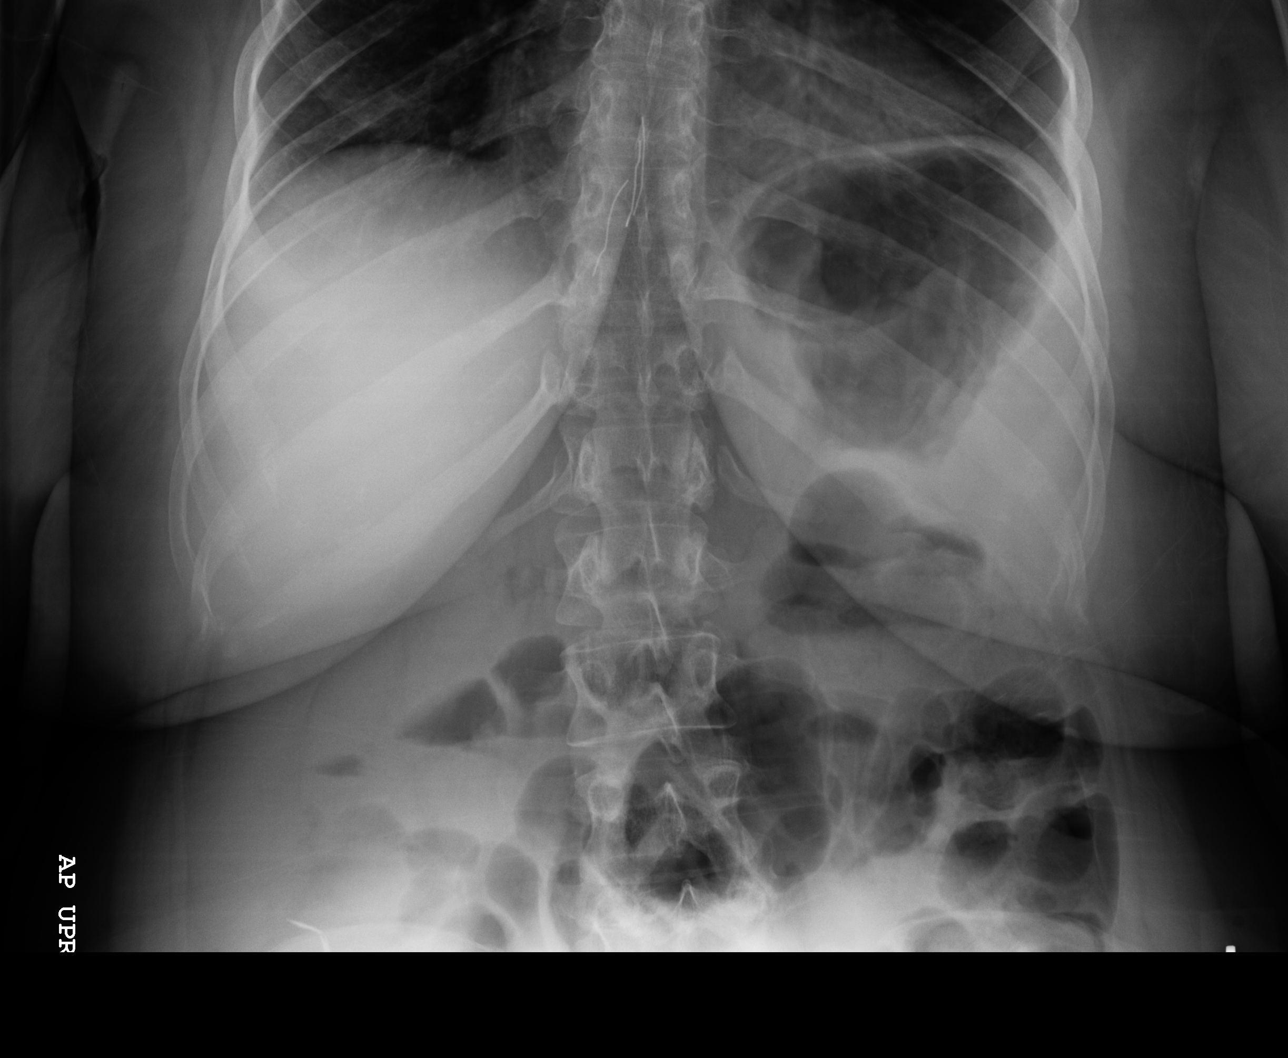
[im 2/4]
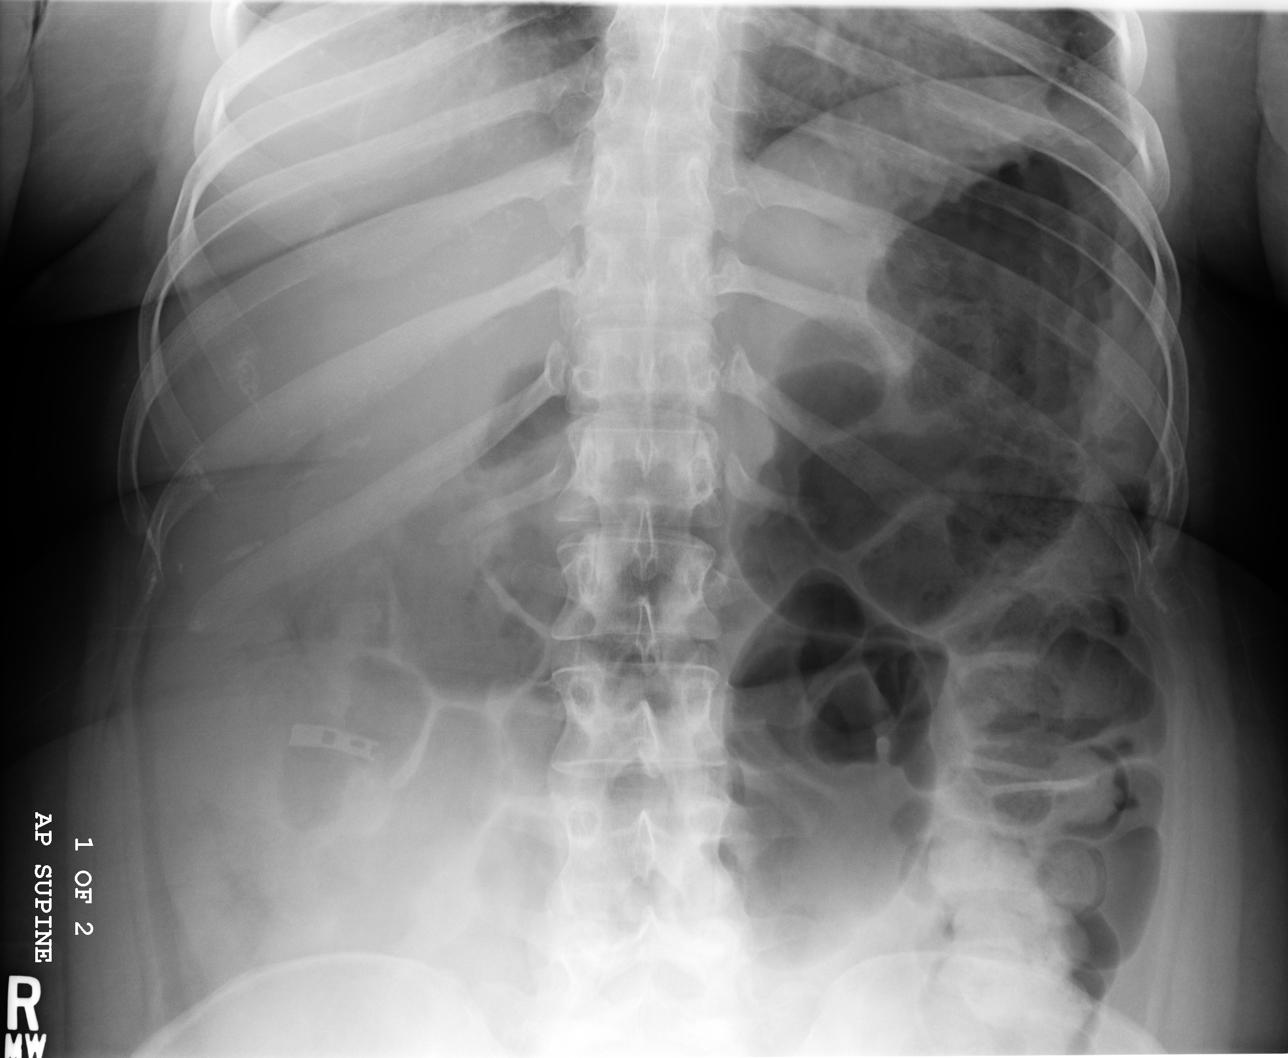
[im 3/4]
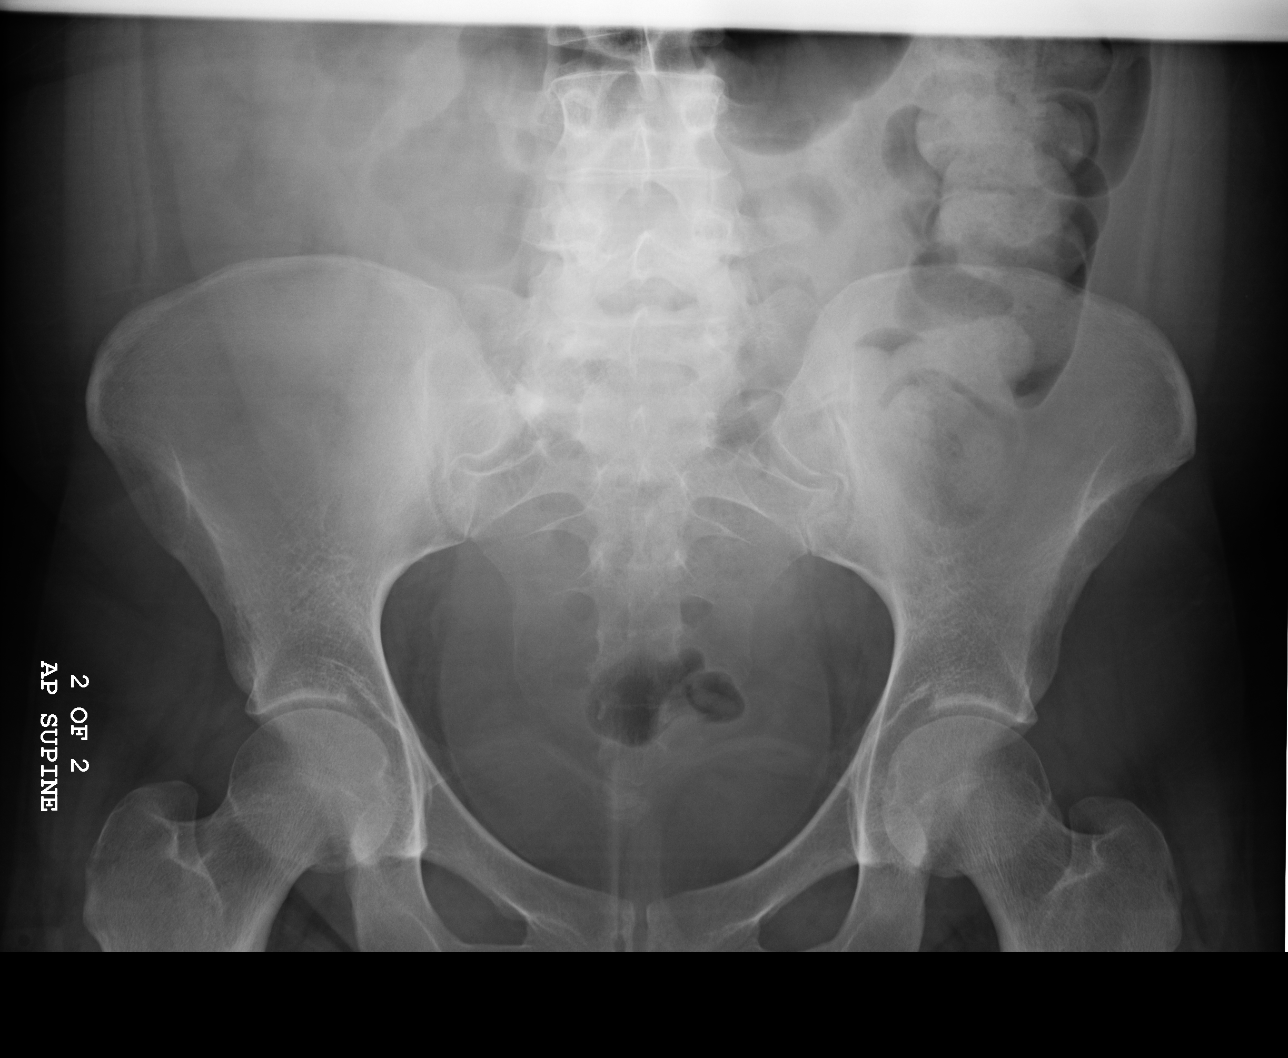
[im 4/4]
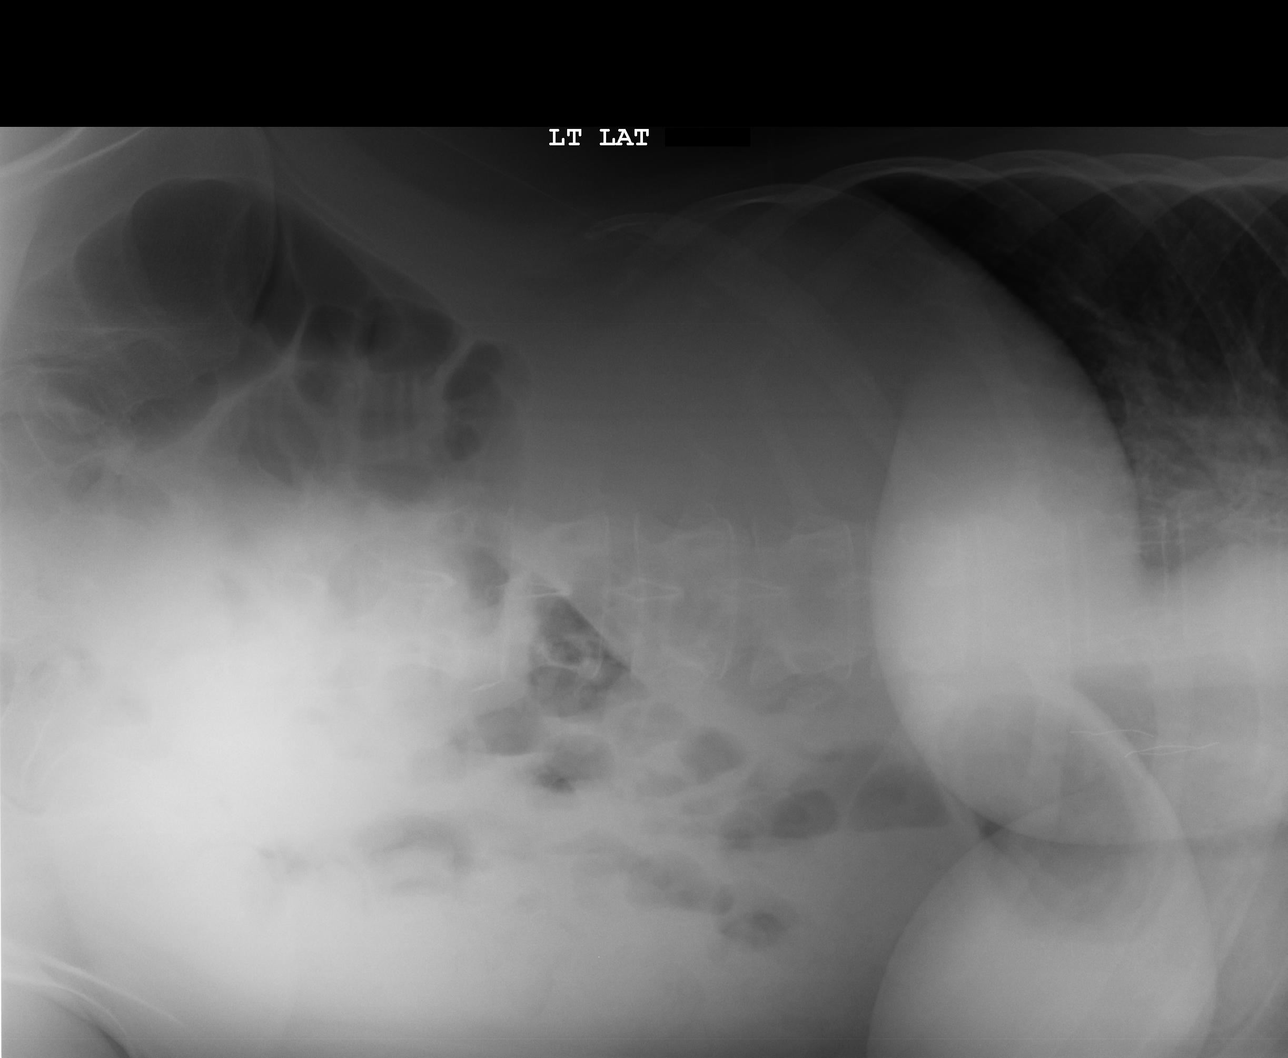

[4 of 4 positions shown; findings below may reference images not displayed]

PROCEDURE:     DXR - DXR ABDOMEN COMPLETE  - [DATE] [DATE]

RESULT:     Comparison is made to prior exam of [DATE]. There is again
noted a metallic density projected over the right abdomen. The location is
likely to be in the ileum or ascending colon. No subdiaphragmatic free air
is seen. The bowel gas pattern is normal. No significant osseous
abnormalities are seen.
IMPRESSION: 1.     The ingested foreign body is visualized in the right abdomen as noted
above.

## 2010-01-02 ENCOUNTER — Inpatient Hospital Stay: Payer: Self-pay | Admitting: Unknown Physician Specialty

## 2010-01-02 IMAGING — CR DG ABDOMEN 3V
1 series · 4 of 4 positions shown · non-contrast
Comparison: none

REASON FOR EXAM: metallic foriegn body ingestion
COMMENTS:

[Series 1: view not recorded · 0.17mm/px · 4 of 4 slices shown]
[im 1/4]
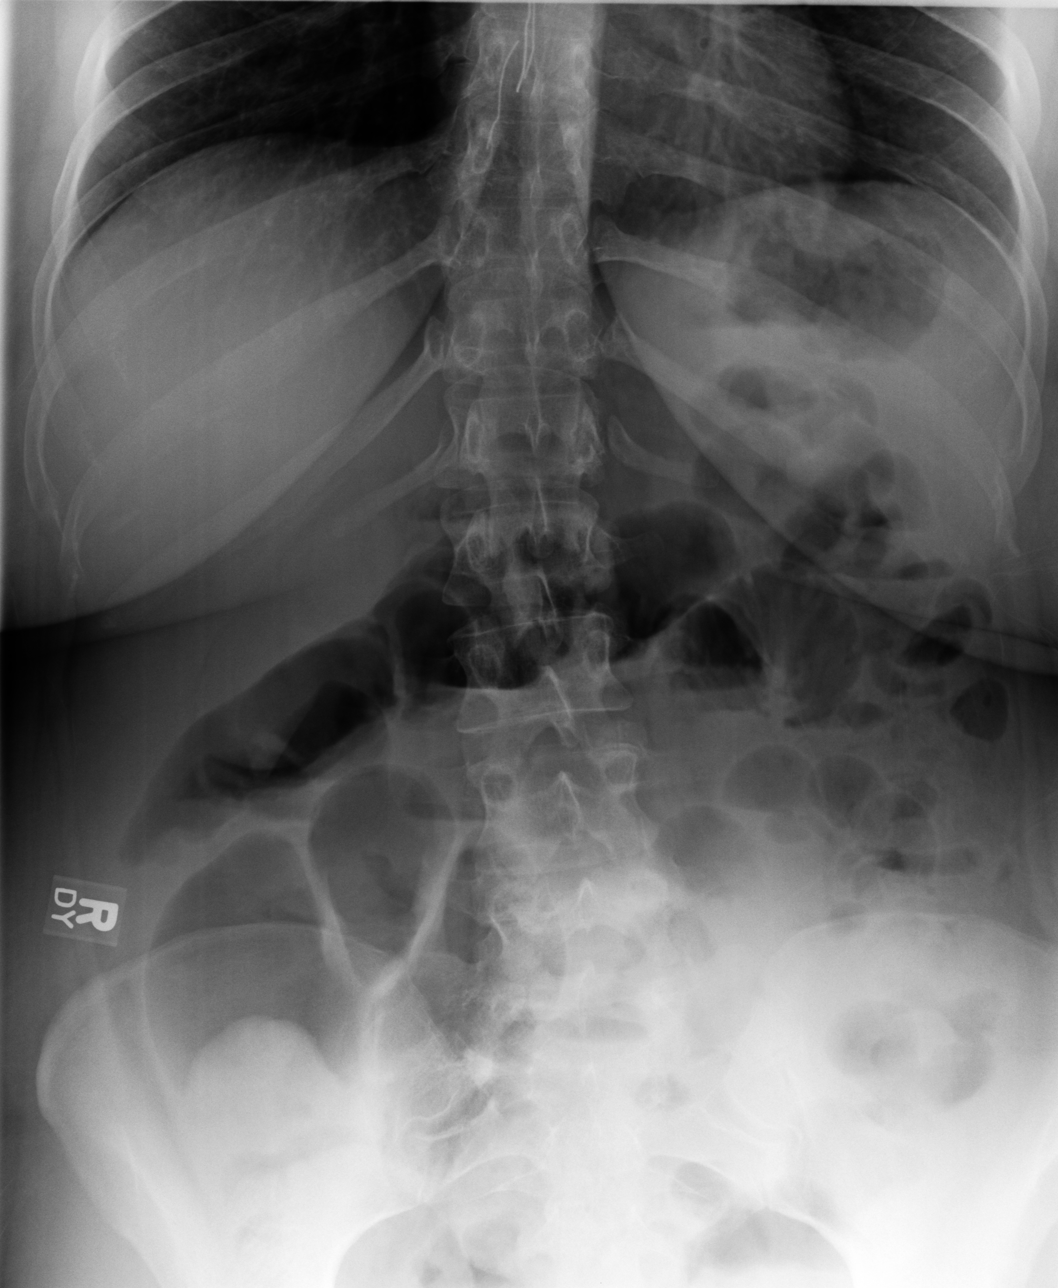
[im 2/4]
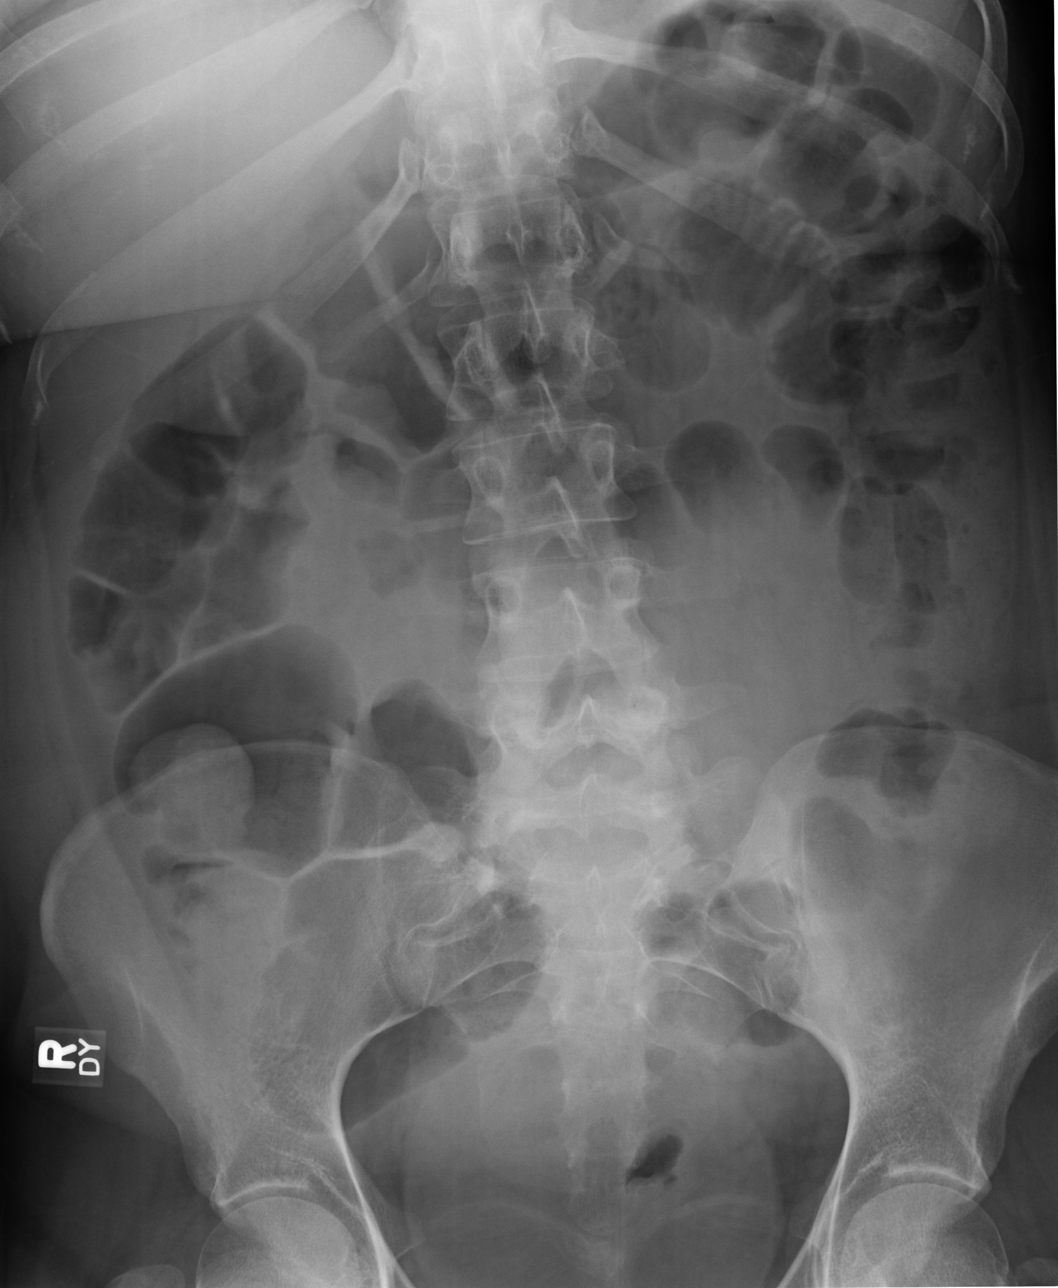
[im 3/4]
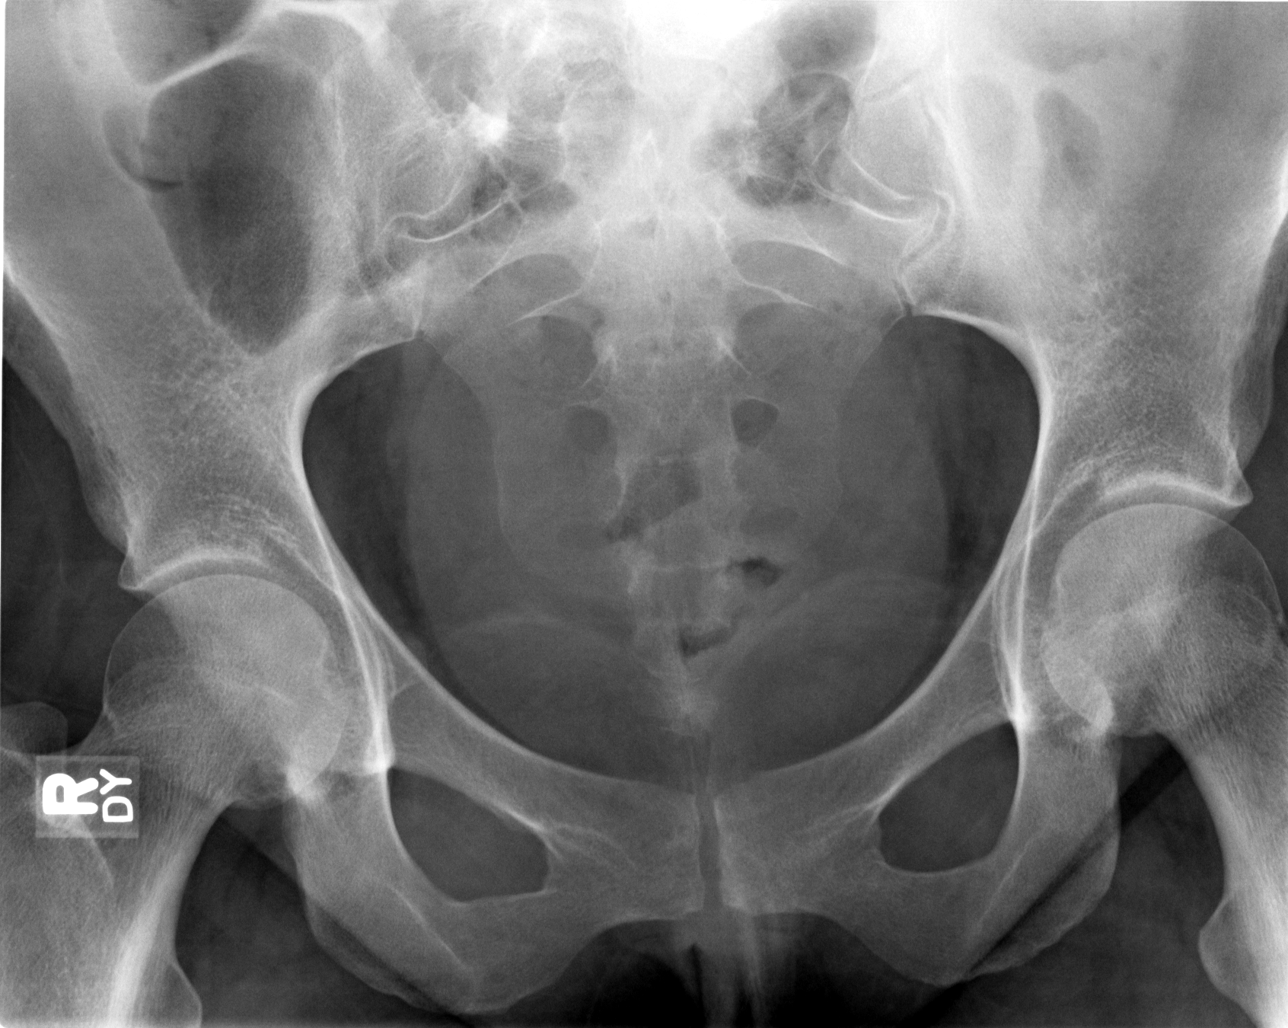
[im 4/4]
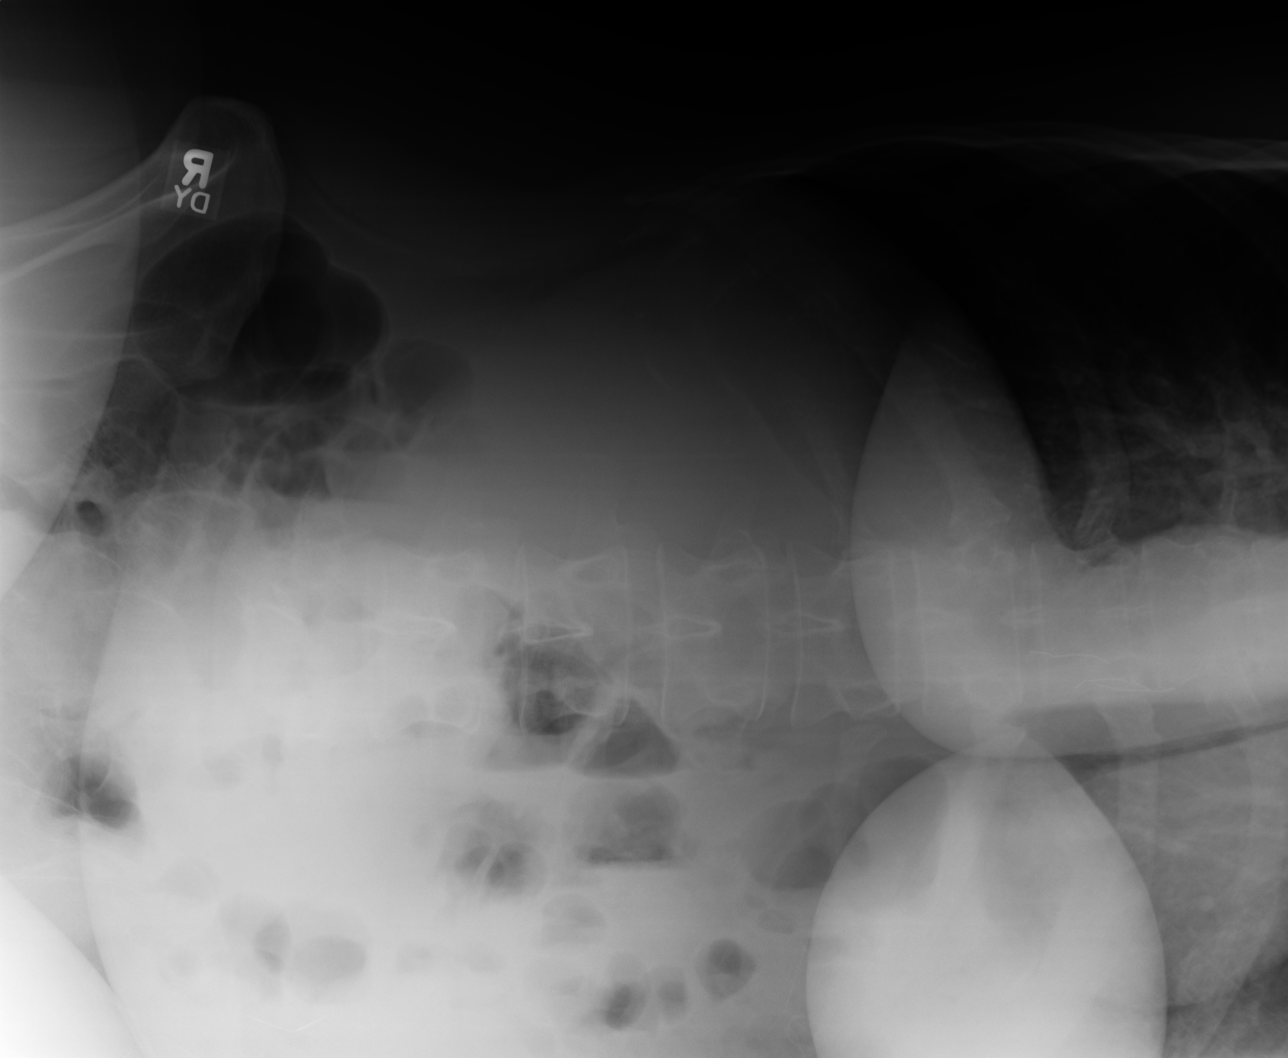

[4 of 4 positions shown; findings below may reference images not displayed]

PROCEDURE:     DXR - DXR ABDOMEN COMPLETE  - [DATE]  [DATE]

RESULT:     Images of the abdomen and erect and supine positions as well as
a left lateral decubitus view are obtained. The decubitus view is
overexposed. The previous radiopacity over the right midabdomen is not
identified in that location. The opacity is not definitely identified
elsewhere in the abdomen on today's exam and may have passed. No definite
free air is identified. The bony structures appear unremarkable.
IMPRESSION: Previously demonstrated radiopaque foreign body is not
evident on today's exam.

## 2010-01-10 ENCOUNTER — Inpatient Hospital Stay: Payer: Self-pay | Admitting: Unknown Physician Specialty

## 2010-02-07 ENCOUNTER — Emergency Department: Payer: Self-pay | Admitting: Emergency Medicine

## 2010-02-07 IMAGING — CT CT HEAD WITHOUT CONTRAST
2 series · 15 of 30 positions shown, 19 images · non-contrast
Comparison: none

REASON FOR EXAM: nail in head
COMMENTS:

[Series 2: without · axial · non-contrast · 0.45mm/px · z∈[-124,-4]mm · 13 of 30 slices shown, 17 images]
[im 3/30  brain]
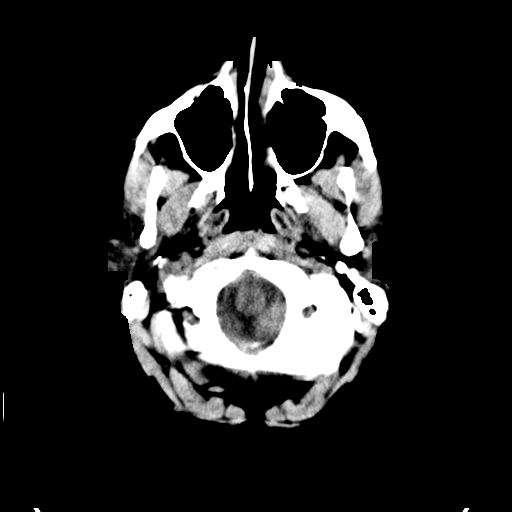
[im 3/30  bone]
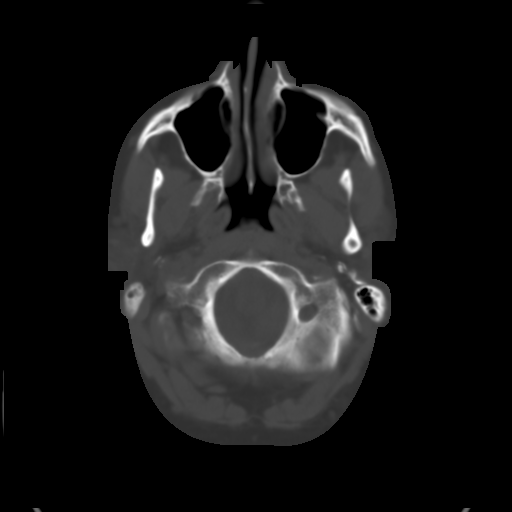
[im 5/30  brain]
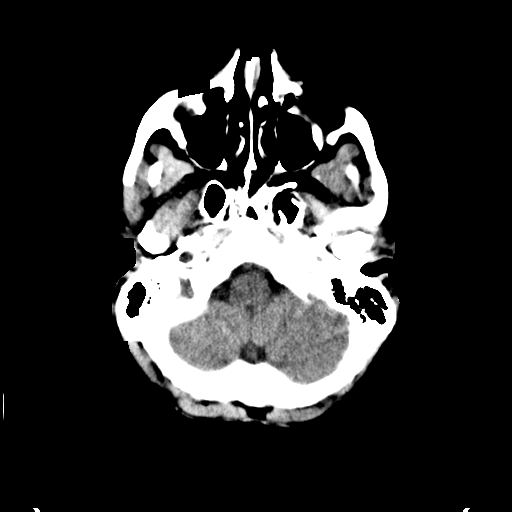
[im 7/30  brain]
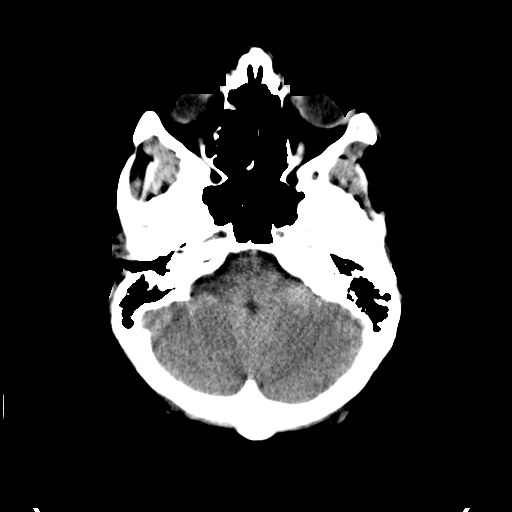
[im 9/30  brain]
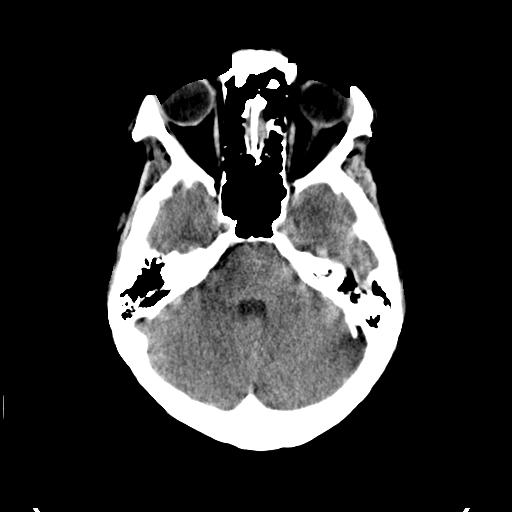
[im 11/30  brain]
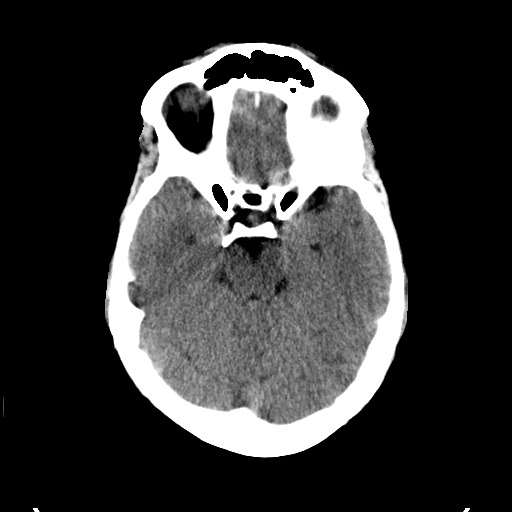
[im 11/30  bone]
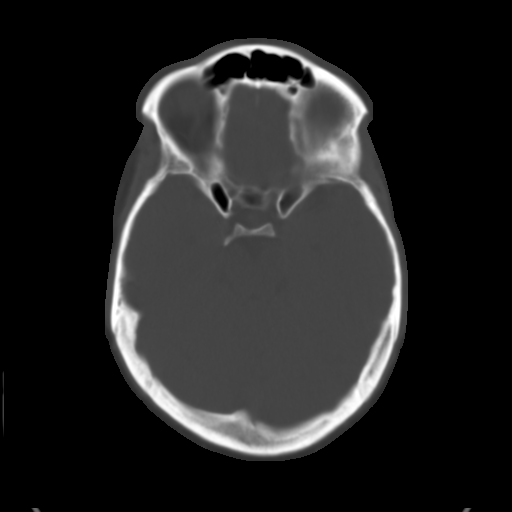
[im 13/30  brain]
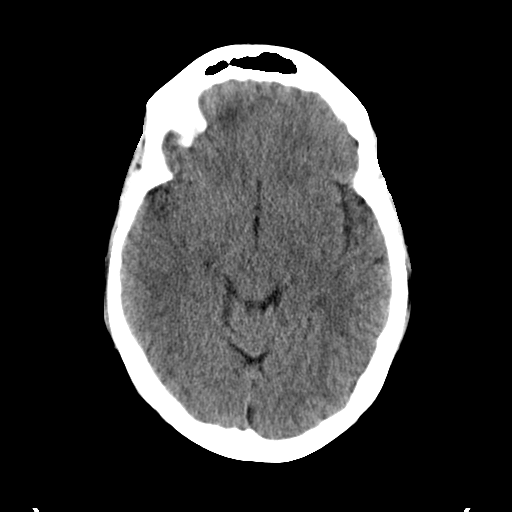
[im 15/30  brain]
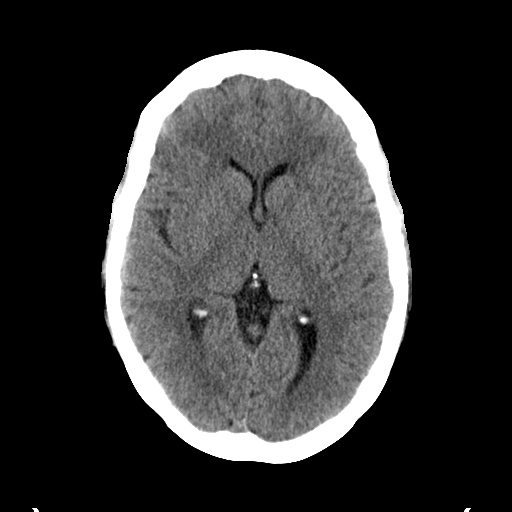
[im 17/30  brain]
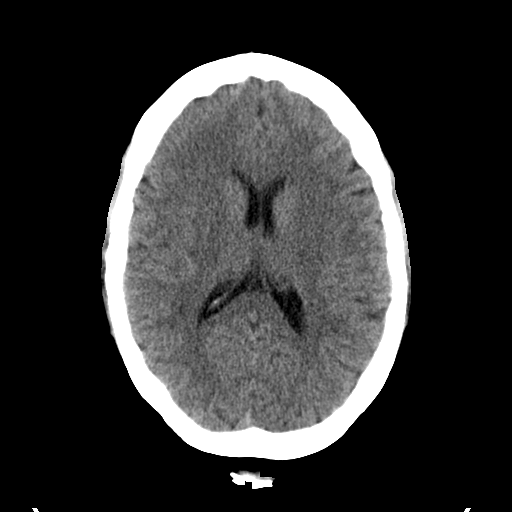
[im 19/30  brain]
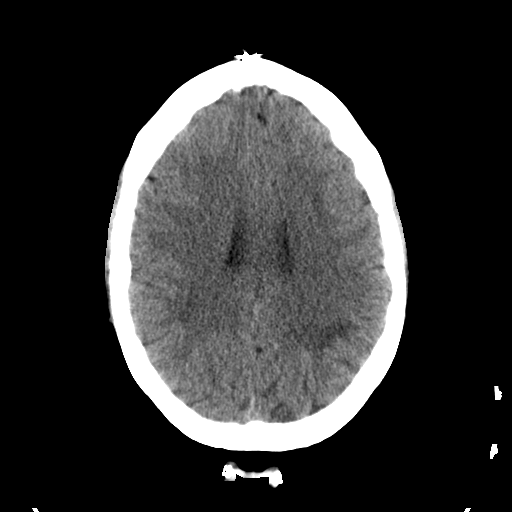
[im 19/30  bone]
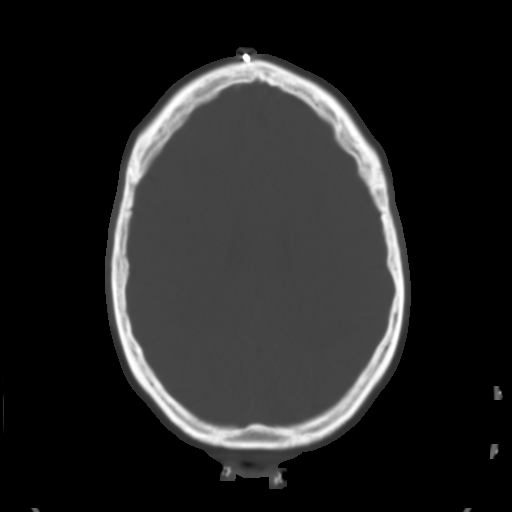
[im 21/30  brain]
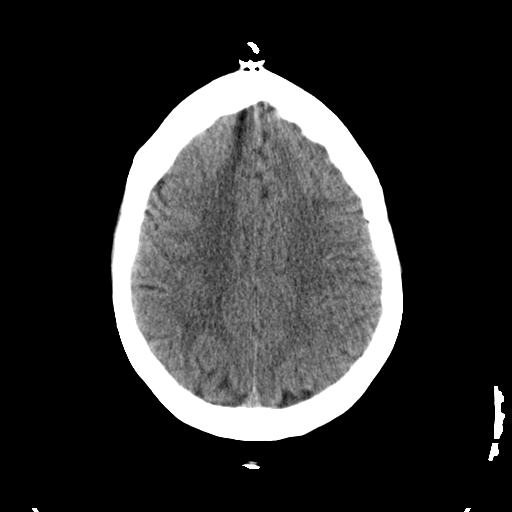
[im 23/30  brain]
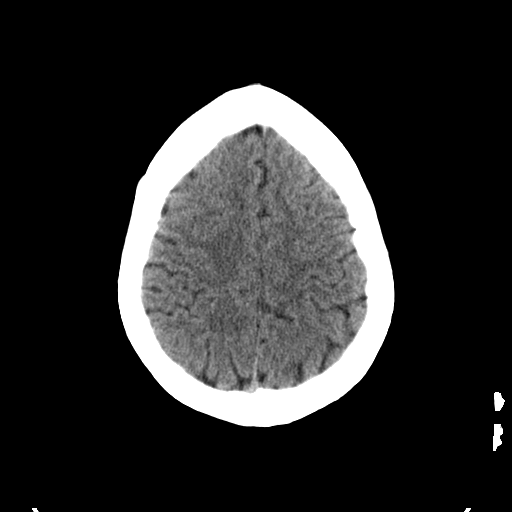
[im 25/30  brain]
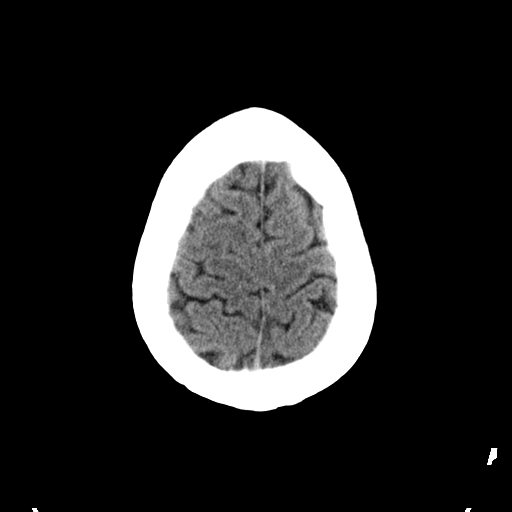
[im 27/30  brain]
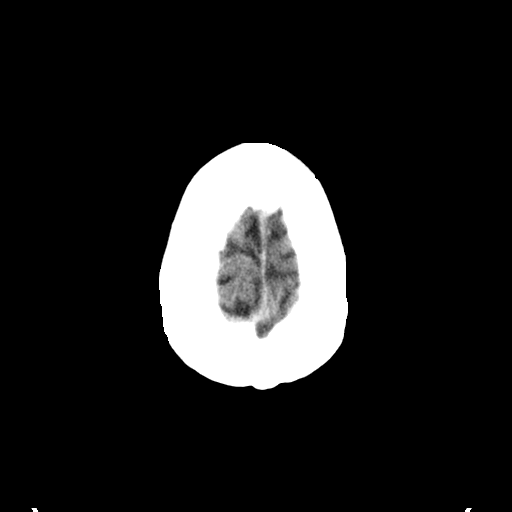
[im 27/30  bone]
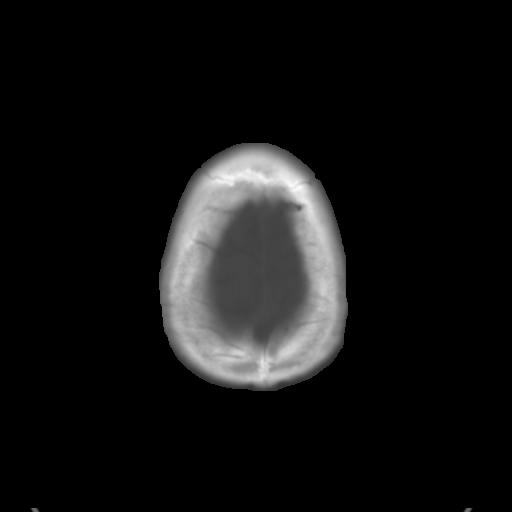

[Series 3: bone · axial · 0.45mm/px · z∈[-124,-104]mm · 2 of 30 slices shown]
[im 3/30  bone]
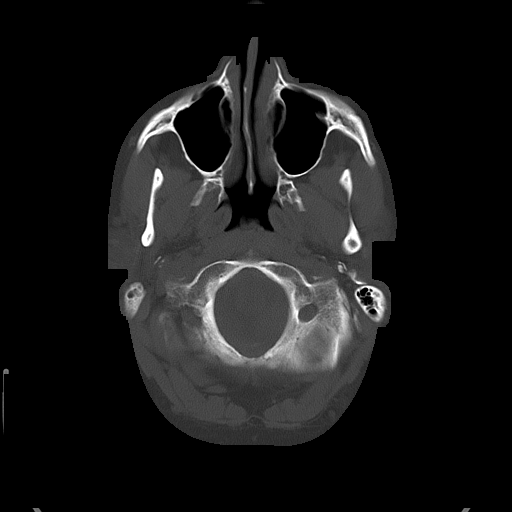
[im 7/30  bone]
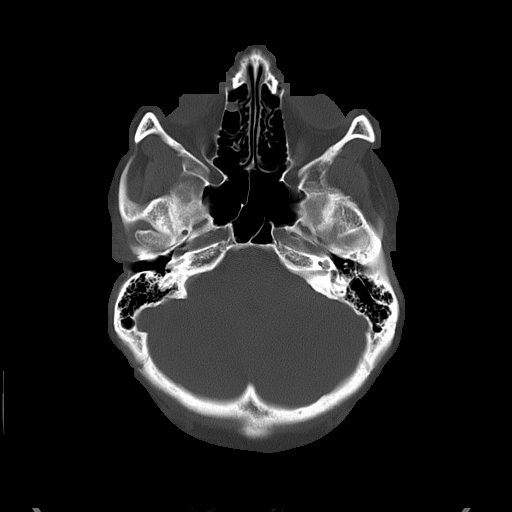

[15 of 30 positions shown; findings below may reference images not displayed]

PROCEDURE:     CT  - CT HEAD WITHOUT CONTRAST  - [DATE]  [DATE]

RESULT:     Axial noncontrast CT scanning was performed through the brain at
5 mm intervals and slice thicknesses.

There is a metallic nail within the soft tissues over the midline of the
height frontal region. This does not appear to penetrate the calvarium. The
ventricles are normal in size and position. There is no intracranial
hemorrhage nor intracranial mass effect. There is subtle decreased density
in the deep white matter of the posterior parietal lobes bilaterally which
is nonspecific. Correlation with the patient's clinical history will be
needed.
IMPRESSION: 1. I do not see evidence of acute intracranial injury.
2. There is subtle decreased density in the deep white matter of both
posterior parietal lobes somewhat more on the left than on the right that is
a nonspecific finding.
3. The metallic foreign body is confined to the soft tissues external to the
calvarium in the high forehead region.

## 2010-02-15 ENCOUNTER — Emergency Department: Payer: Self-pay | Admitting: Emergency Medicine

## 2010-02-15 IMAGING — CR DG CHEST 2V
1 series · 2 of 2 positions shown · non-contrast
Comparison: none

REASON FOR EXAM: swallowed foreign body
COMMENTS:

[Series 1: view not recorded · 0.17mm/px · 2 of 2 slices shown]
[im 1/2]
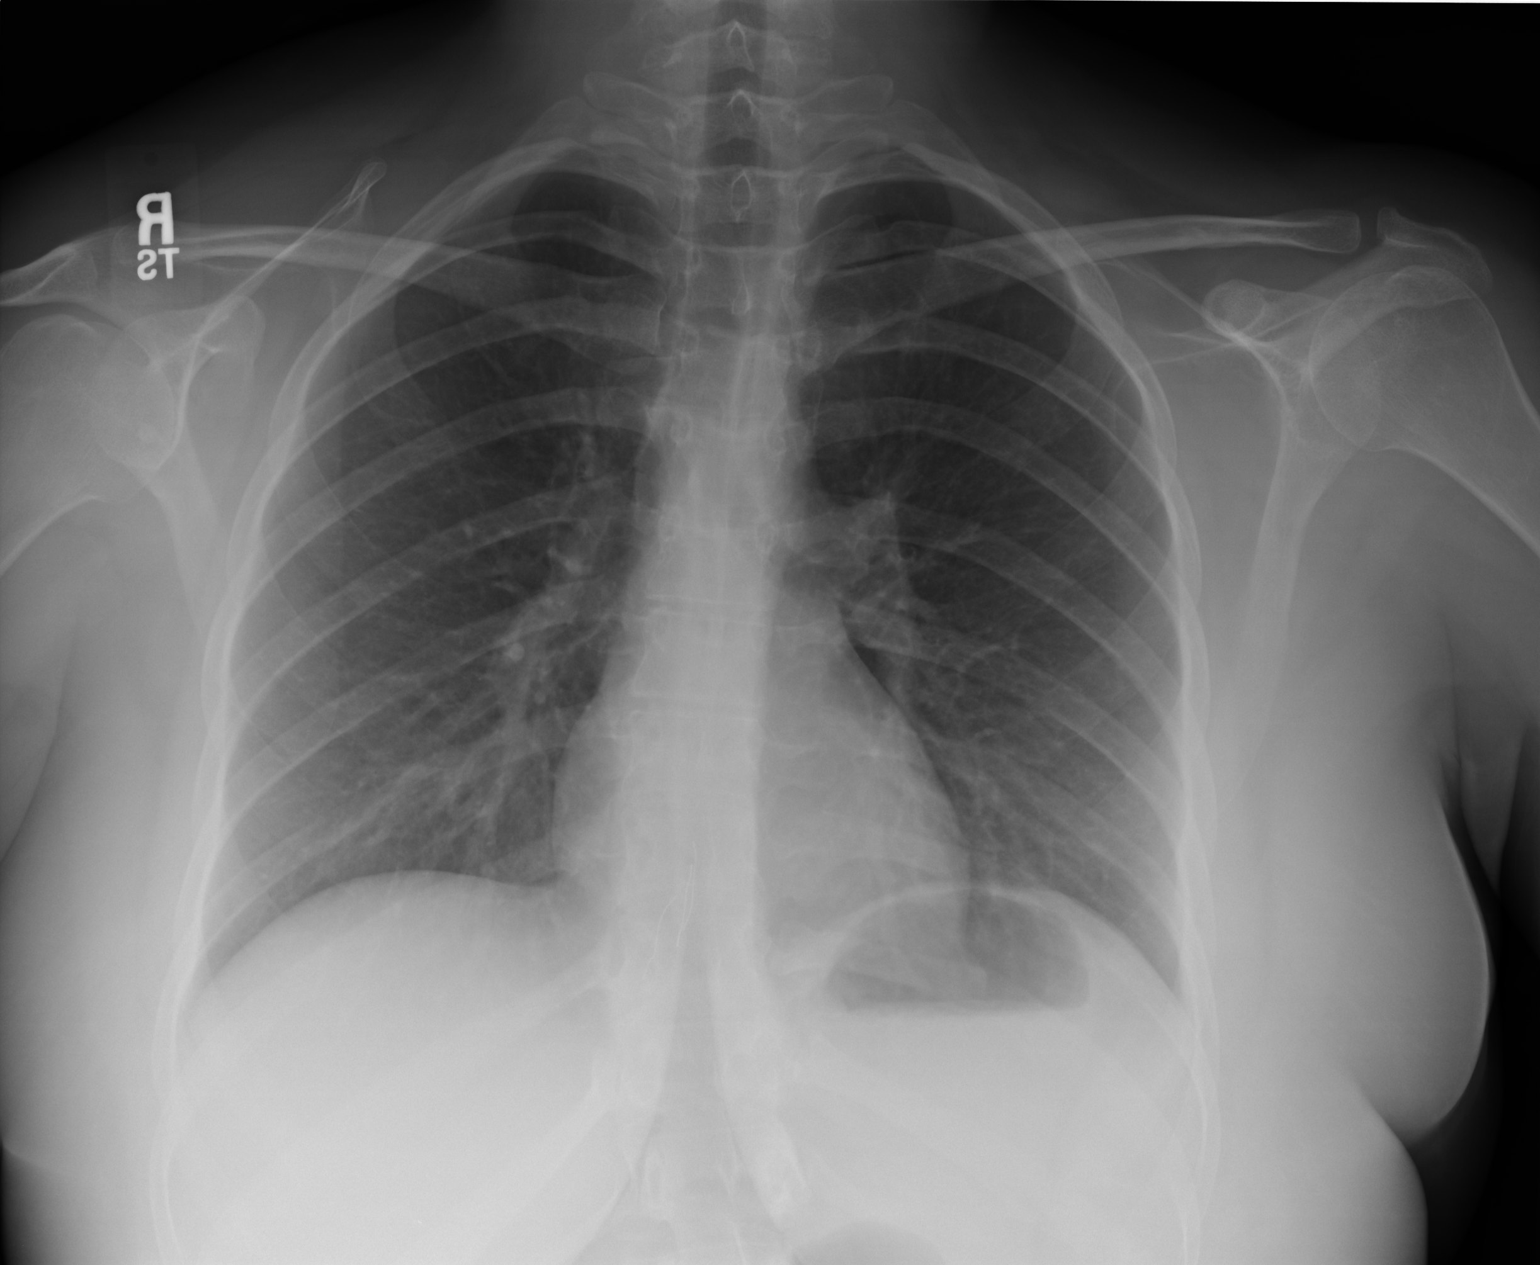
[im 2/2]
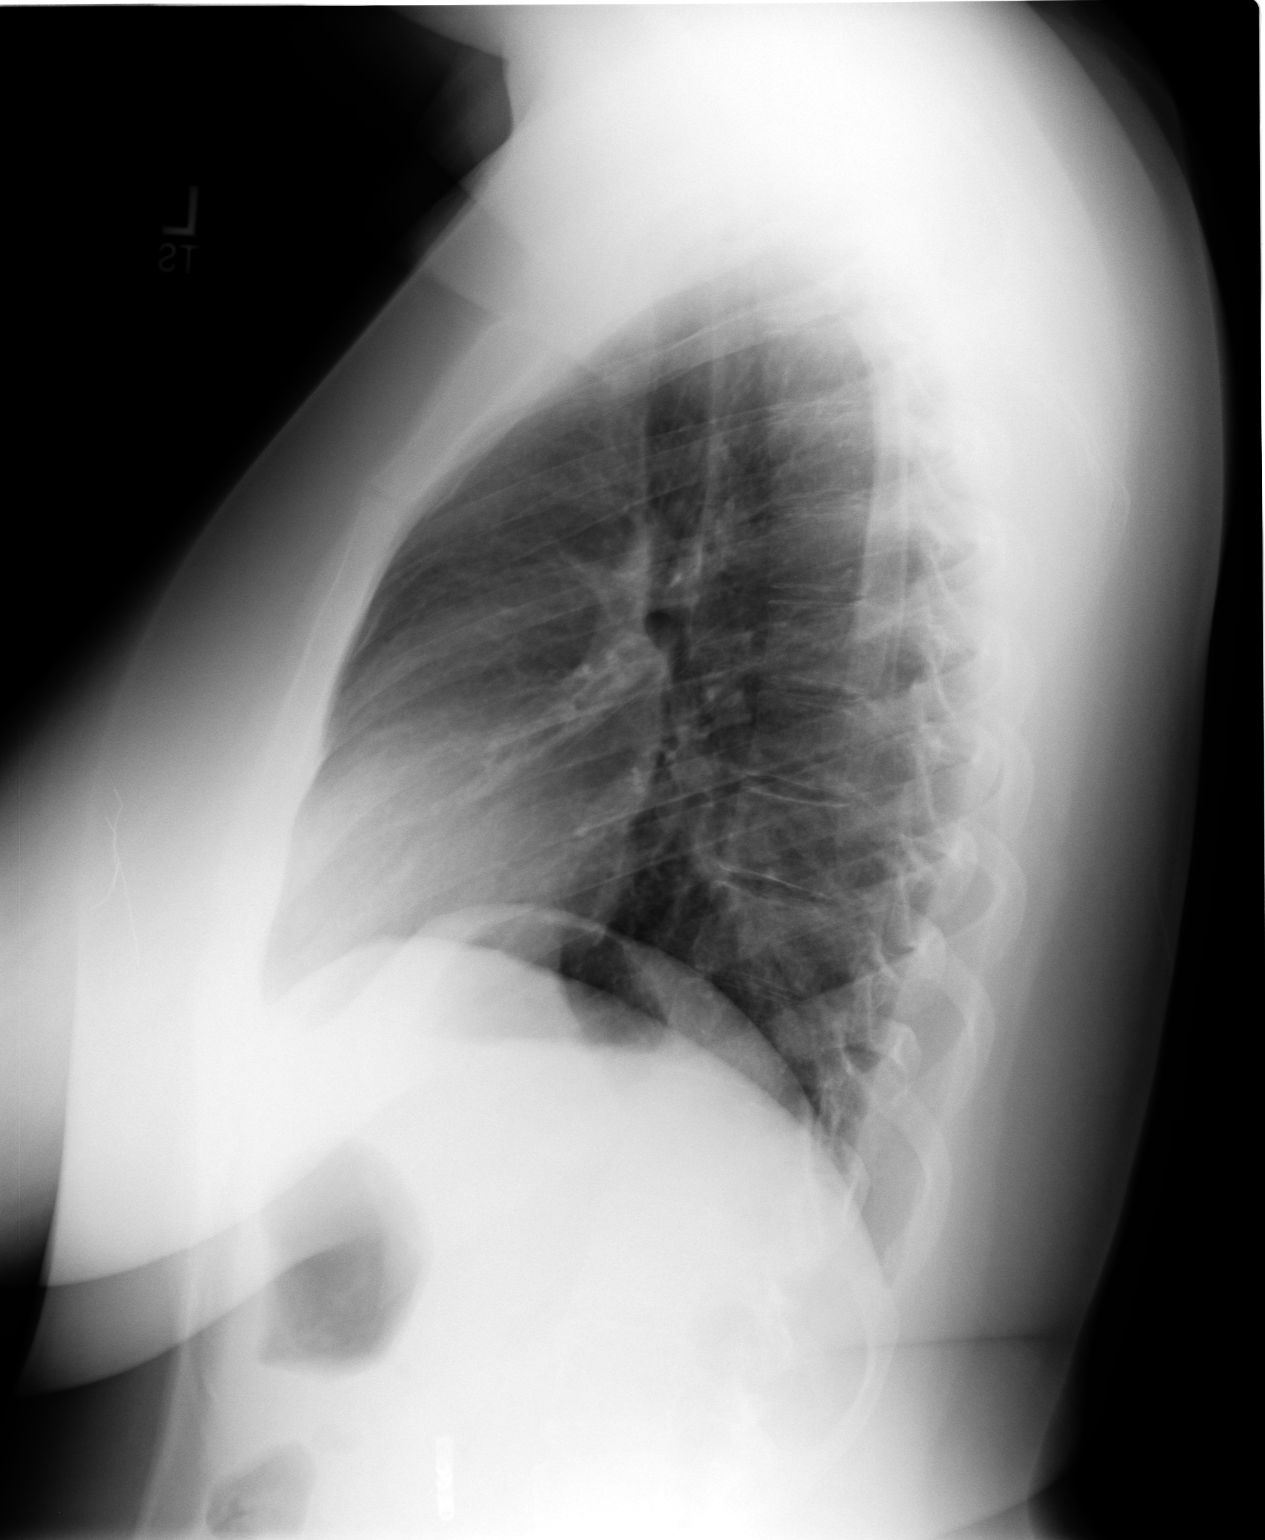

[2 of 2 positions shown; findings below may reference images not displayed]

PROCEDURE:     DXR - DXR CHEST PA (OR AP) AND LATERAL  - [DATE] [DATE]

RESULT:     The lungs are adequately inflated. There is no focal infiltrate.
I see no radiopaque foreign bodies. The cardiac silhouette is normal in
size. The pulmonary vascularity is not engorged and there is no pleural
effusion.

In the upper abdomen on the lateral film there is a radiodense structure
demonstrated that cannot be seen on the frontal film.
IMPRESSION: I do not see evidence of a radiopaque foreign body in the
thorax. There may be a radiopaque foreign body in the upper abdomen is
demonstrated on the lateral film. A followup KUB film would be useful.

## 2010-02-15 IMAGING — CR DG ABDOMEN 1V
1 series · 1 of 1 positions shown · non-contrast
Comparison: none

REASON FOR EXAM: swallowed foreign body
COMMENTS:

[view not recorded]
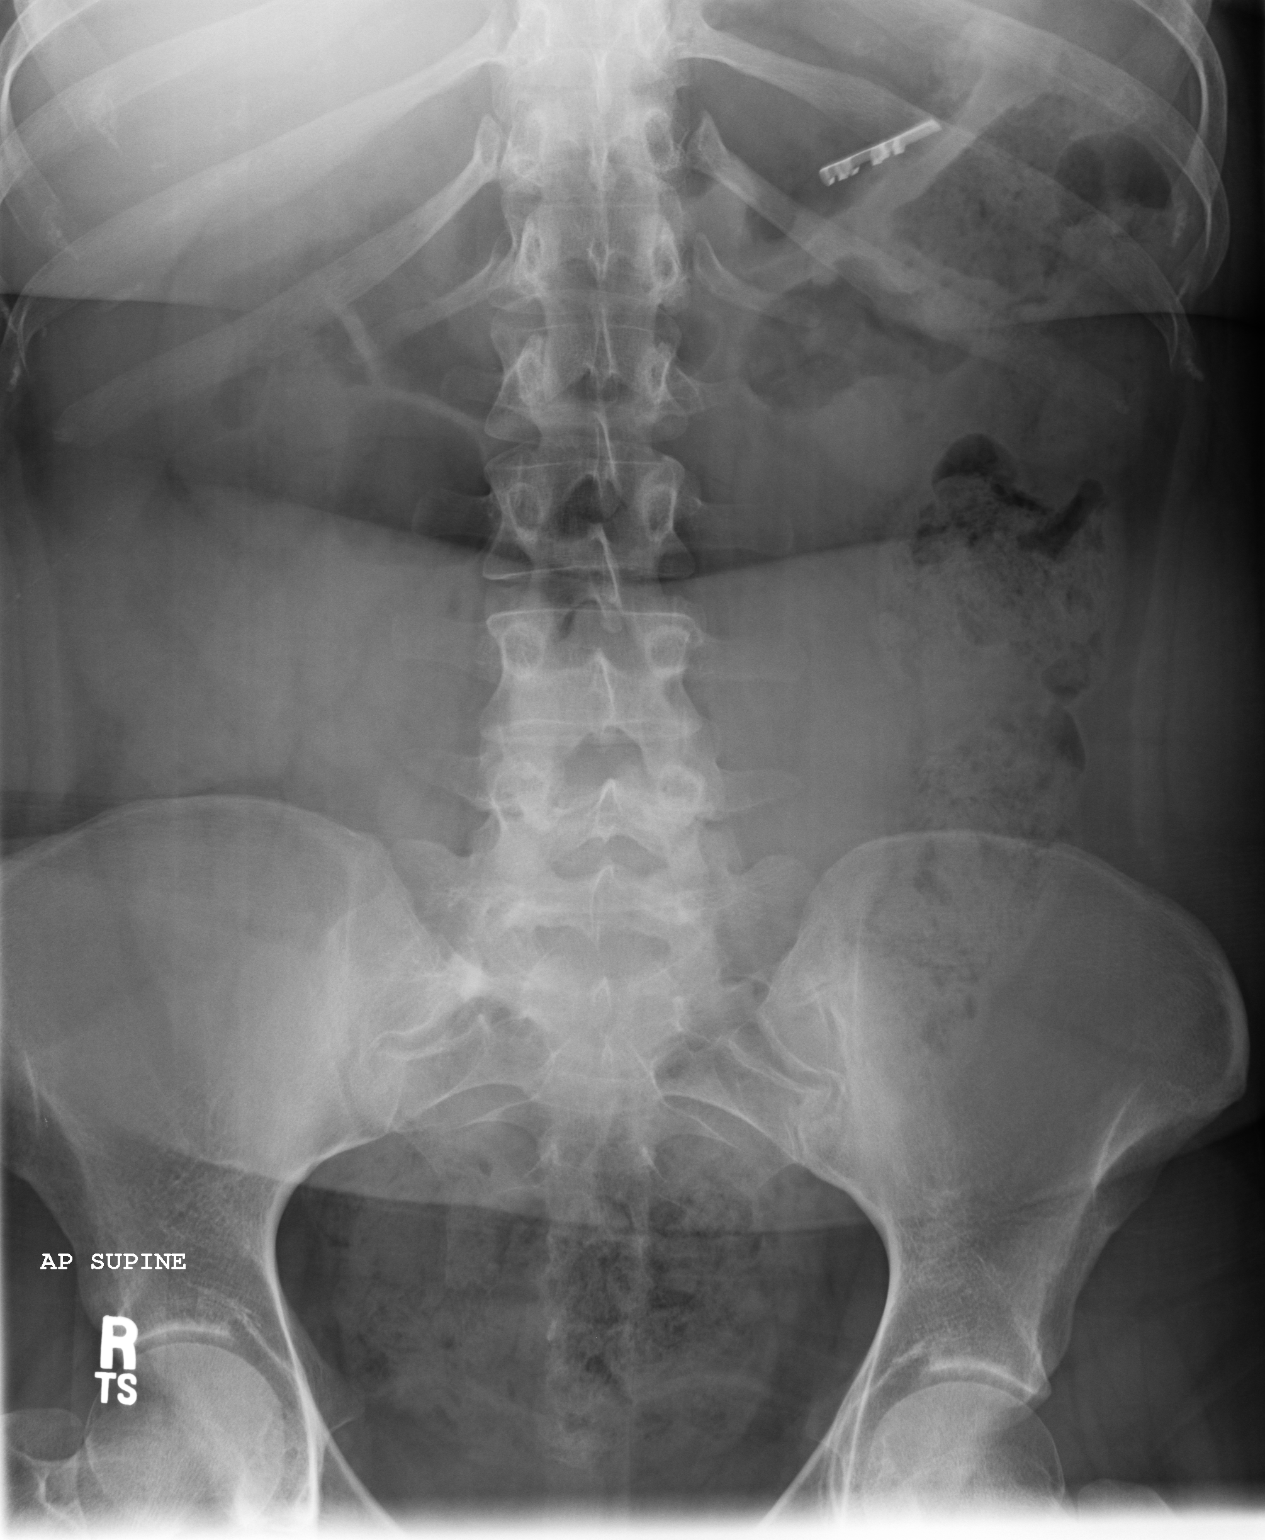

[1 of 1 positions shown; findings below may reference images not displayed]

PROCEDURE:     DXR - DXR KIDNEY URETER BLADDER  - [DATE] [DATE]

RESULT:     There is a radiopaque structure that is likely metallic
projecting in the left upper quadrant of the abdomen. The bowel gas pattern
is within the limits of normal. The bony structures appear normal. A
metallic foreign body is felt to be present likely within the stomach. It
measures 5 mm in thickness x 3.5 cm in length.
IMPRESSION: Indeed there is a radiopaque metallic-appearing foreign
body in the left upper quadrant of the abdomen.

## 2010-05-10 ENCOUNTER — Emergency Department: Payer: Self-pay | Admitting: Emergency Medicine

## 2010-05-19 ENCOUNTER — Emergency Department: Payer: Self-pay | Admitting: Emergency Medicine

## 2010-05-19 IMAGING — CT CT STONE STUDY
1 of 2 series · 16 of 32 positions shown, 20 images · non-contrast
Comparison: none

REASON FOR EXAM: flank pain, hematuria
COMMENTS:

[Series 2: soft tissue · axial · 0.80mm/px · z∈[-469,-37]mm · 16 of 156 slices shown, 20 images]
[im 6/156  soft-tissue]
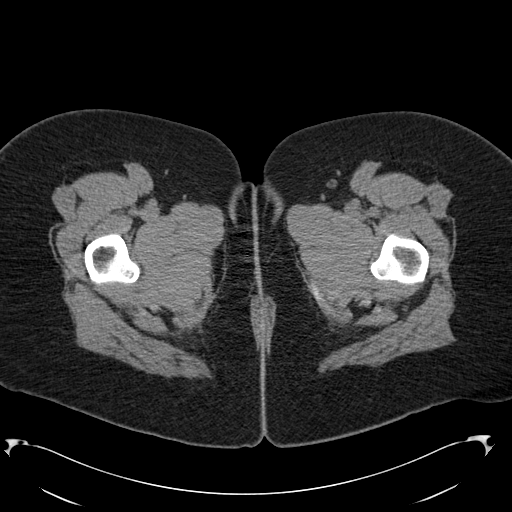
[im 6/156  bone]
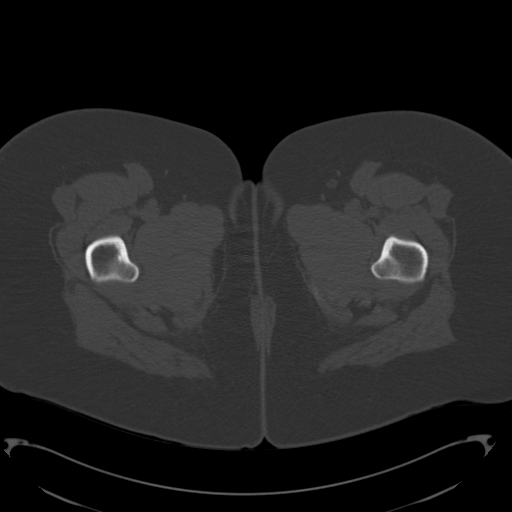
[im 18/156  soft-tissue]
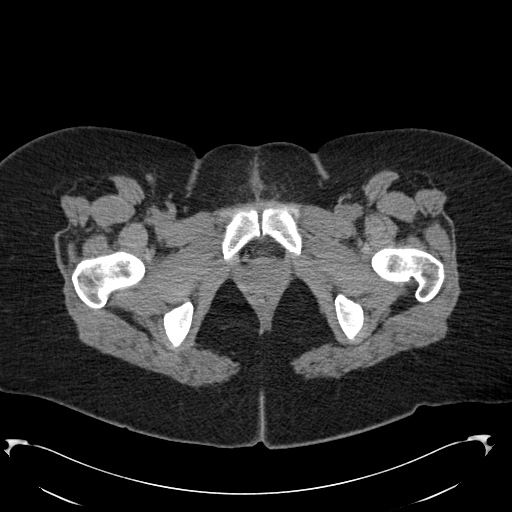
[im 29/156  soft-tissue]
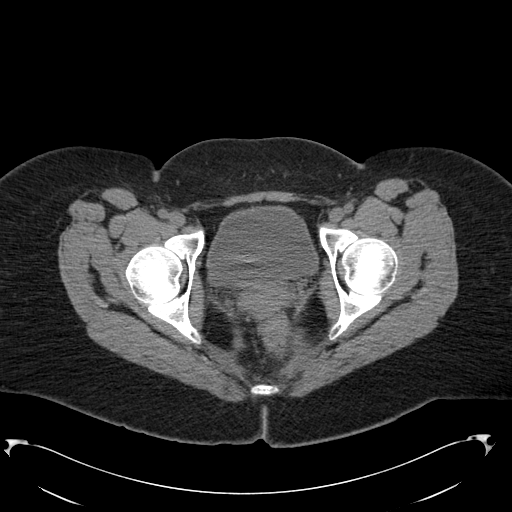
[im 41/156  soft-tissue]
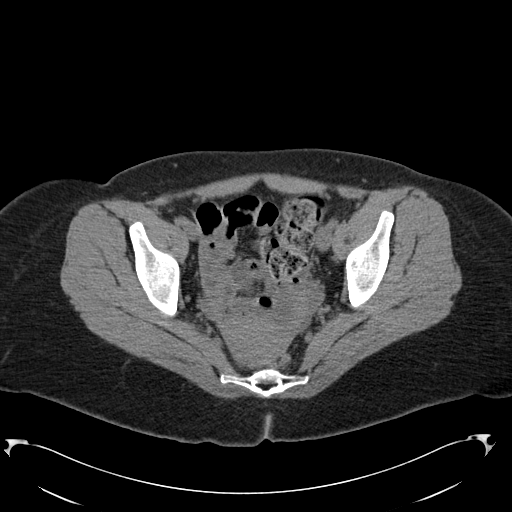
[im 52/156  soft-tissue]
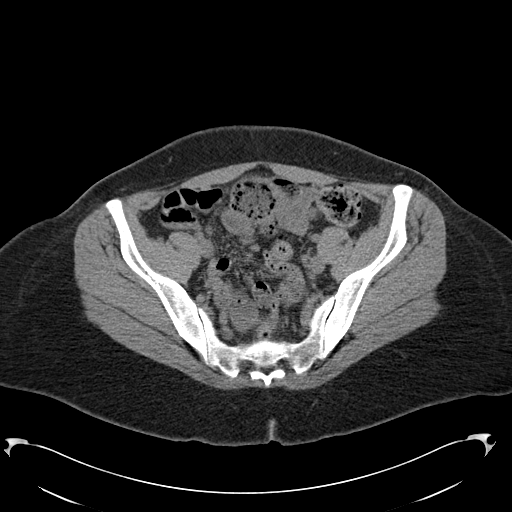
[im 64/156  soft-tissue]
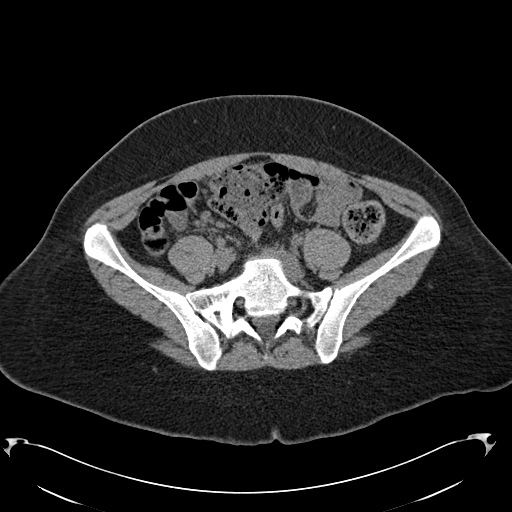
[im 75/156  soft-tissue]
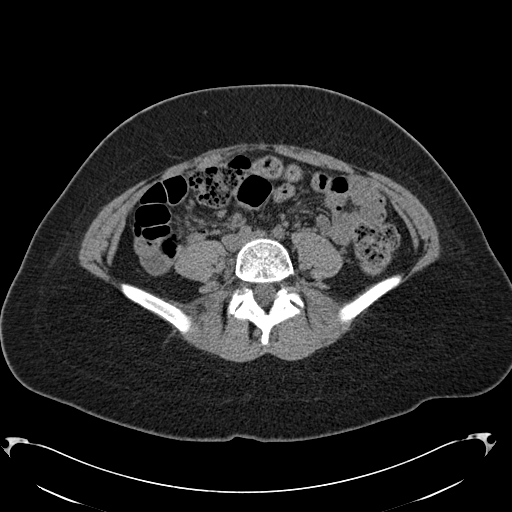
[im 81/156  soft-tissue]
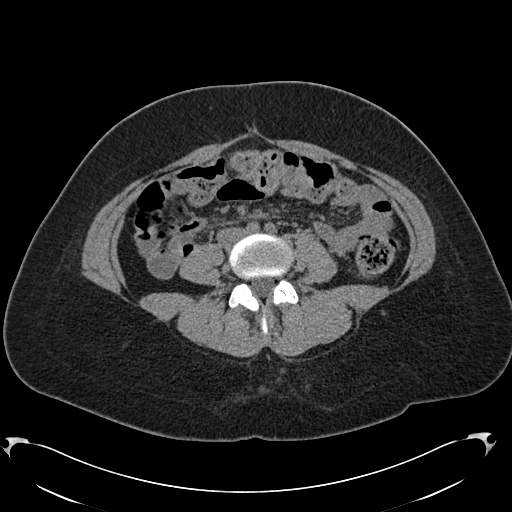
[im 92/156  soft-tissue]
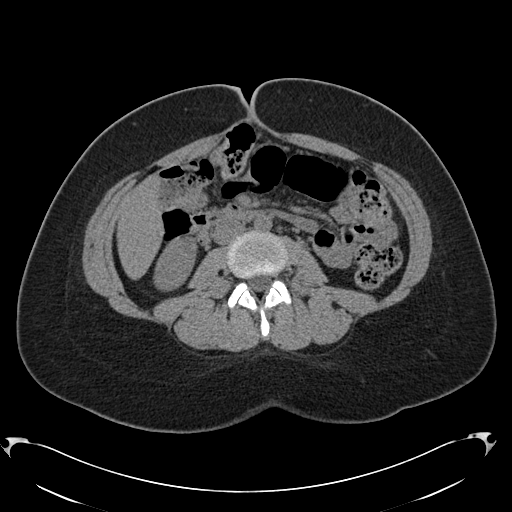
[im 92/156  bone]
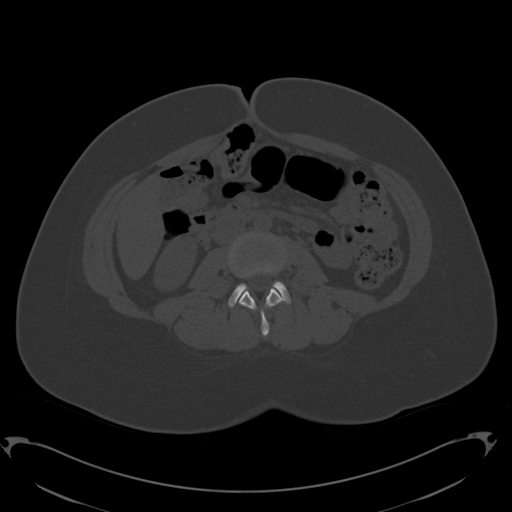
[im 104/156  soft-tissue]
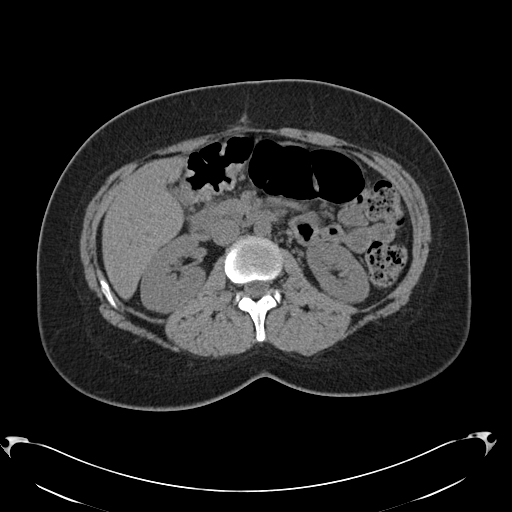
[im 115/156  soft-tissue]
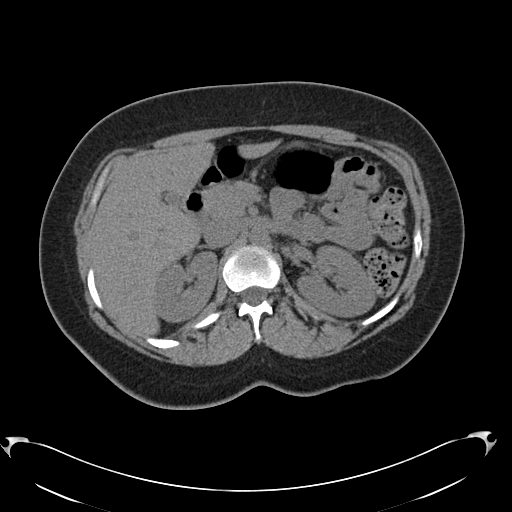
[im 127/156  soft-tissue]
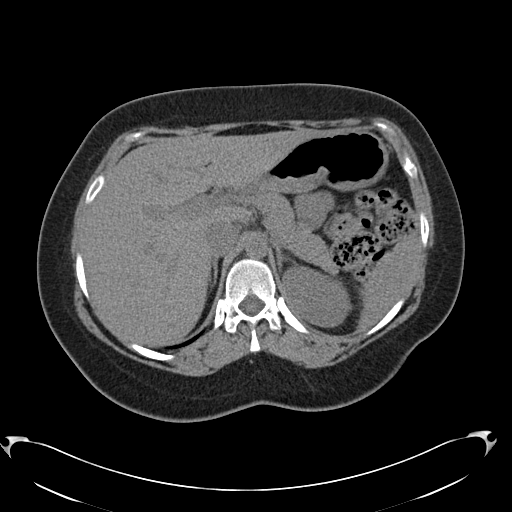
[im 133/156  lung]
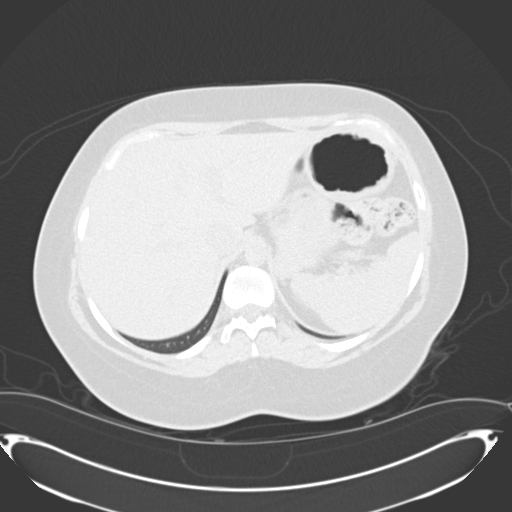
[im 138/156  soft-tissue]
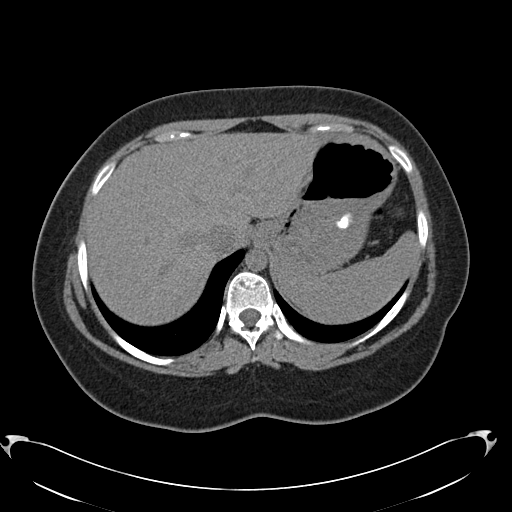
[im 138/156  lung]
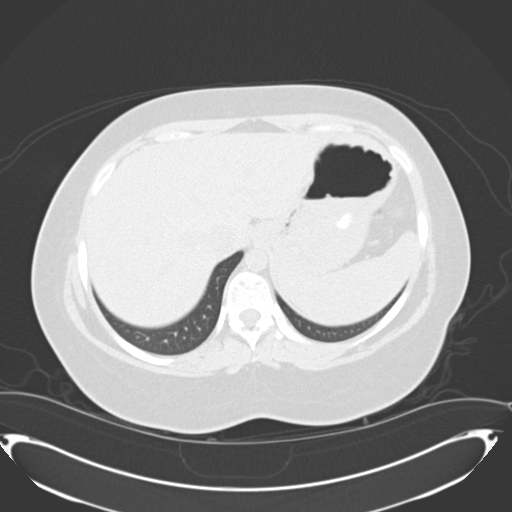
[im 144/156  lung]
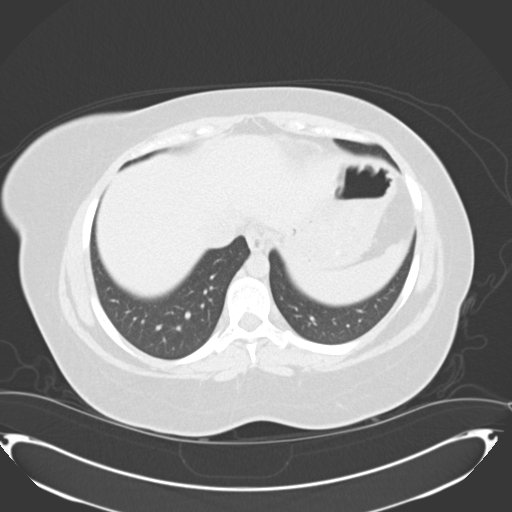
[im 150/156  soft-tissue]
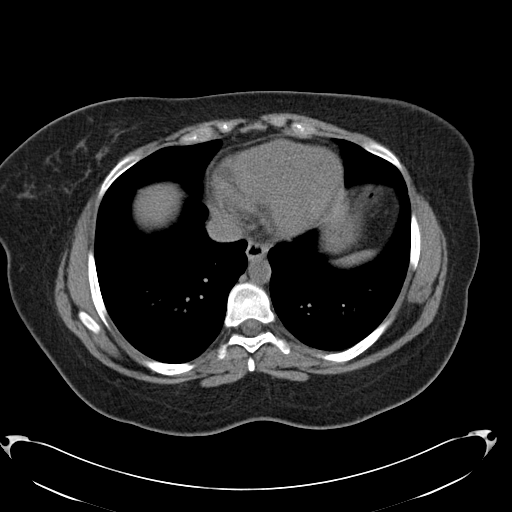
[im 150/156  lung]
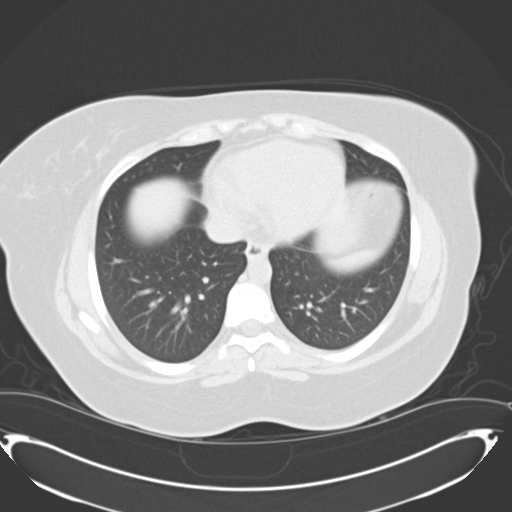

[16 of 32 positions shown; findings below may reference images not displayed]

PROCEDURE:     CT  - CT ABDOMEN /PELVIS WO (STONE)  - [DATE]  [DATE]

RESULT:     Renal stone protocol noncontrast CT of the abdomen and pelvis is
reconstructed at 3 mm slice thickness in the axial plane. There is no
previous exam for comparison.

There is no hydroureteronephrosis or nephrolithiasis evident. No radiopaque
gallstones are evident. There is no ascites or abnormal bowel distention.
The urinary bladder appears unremarkable. No adenopathy is appreciated. The
uterus is present and appears retroverted. No adnexal mass is appreciated on
this noncontrast exam. The appendix is not identified area the abdominal
viscera otherwise appear unremarkable. The lung bases appear clear.
IMPRESSION: 1. No hydroureteronephrosis or renal calculi evident. No ureteral stones
evident.

## 2010-06-14 ENCOUNTER — Inpatient Hospital Stay: Payer: Self-pay | Admitting: Unknown Physician Specialty

## 2010-06-14 IMAGING — CR DG ABDOMEN 1V
1 series · 2 of 2 positions shown · non-contrast
Comparison: none

REASON FOR EXAM: possibly swallowed straight pin 5  days ago
COMMENTS:

[Series 1: view not recorded · 0.17mm/px · 2 of 2 slices shown]
[im 1/2]
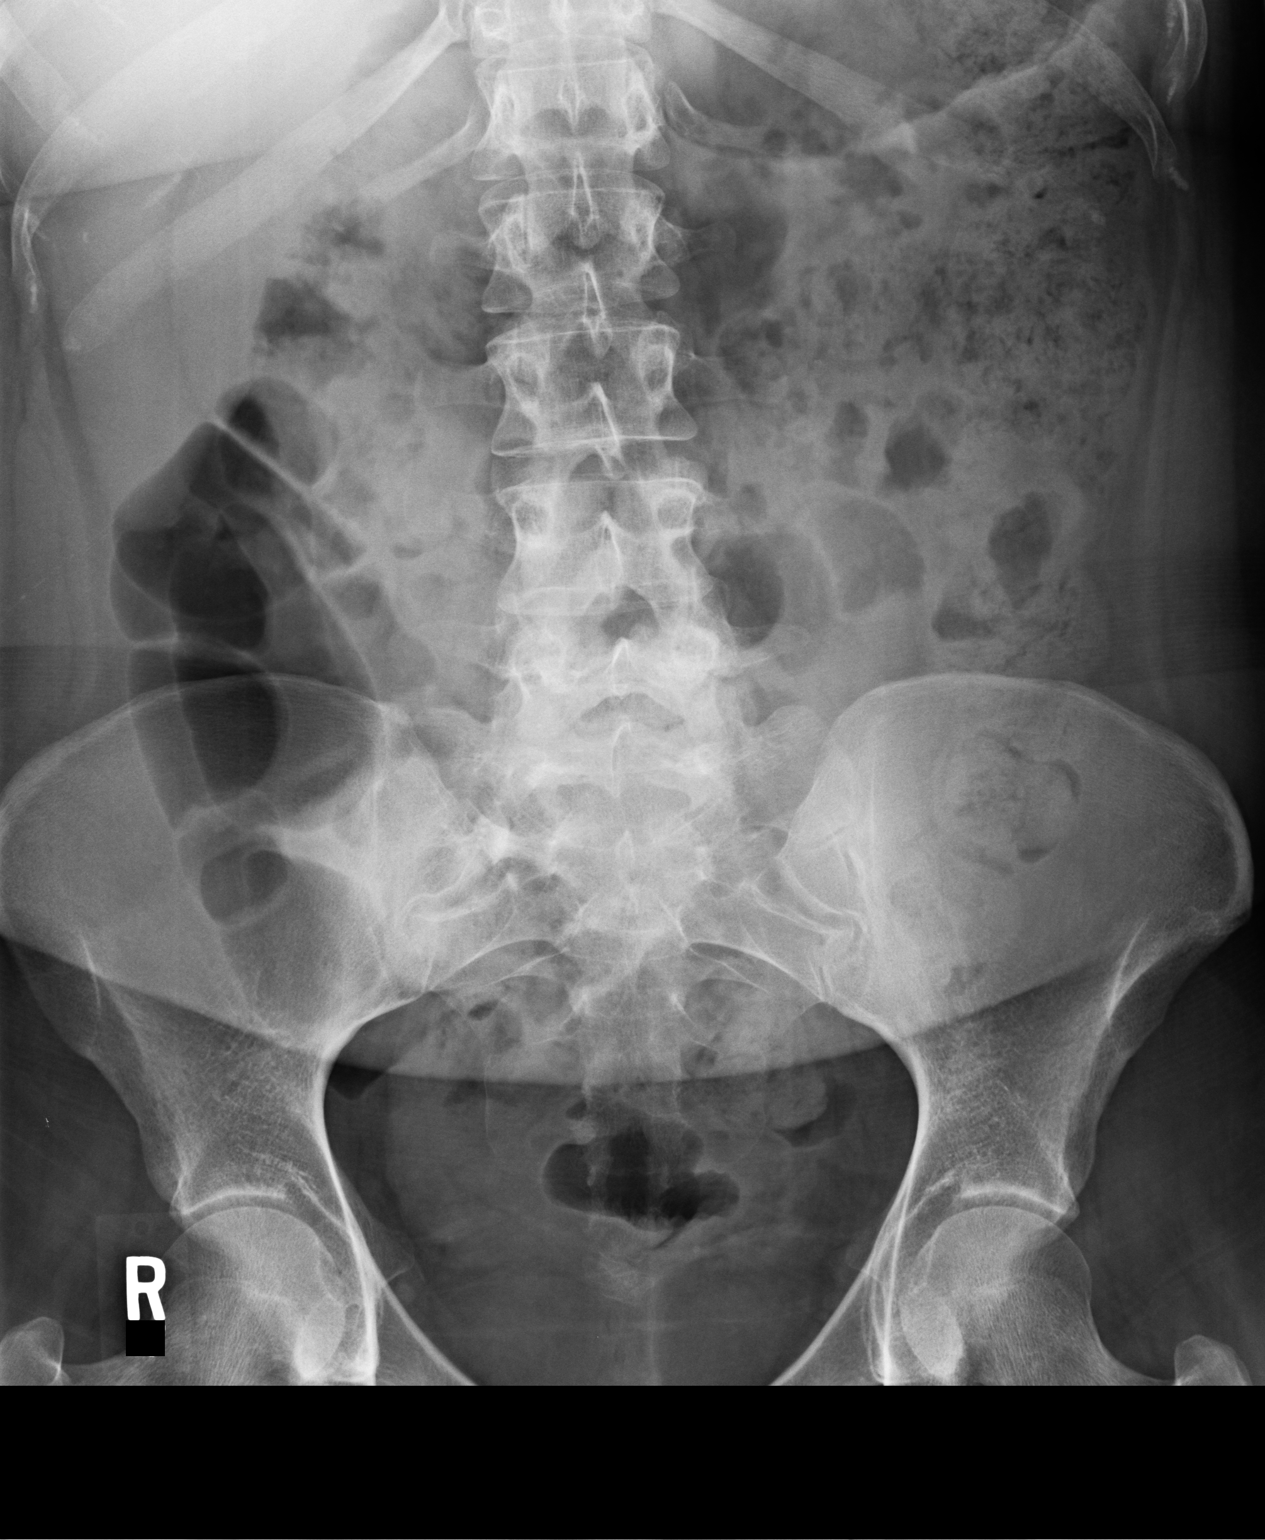
[im 2/2]
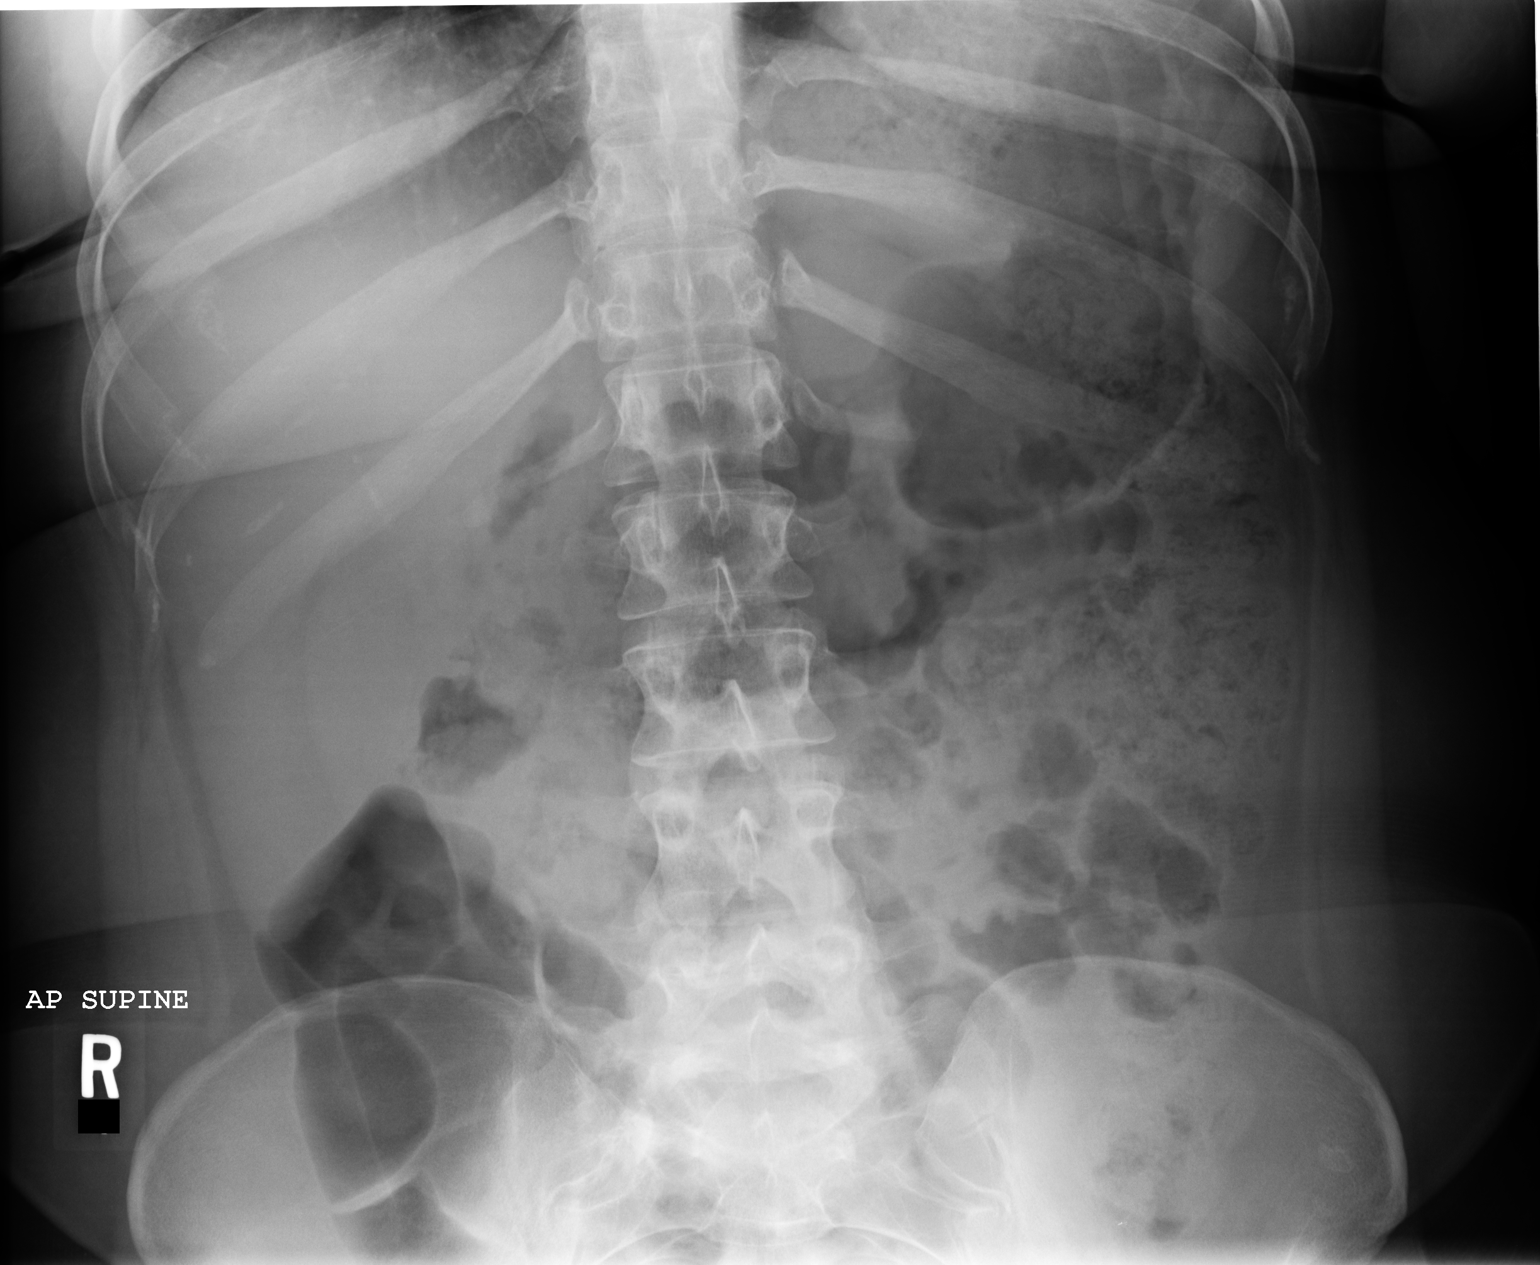

[2 of 2 positions shown; findings below may reference images not displayed]

PROCEDURE:     DXR - DXR KIDNEY URETER BLADDER  - [DATE]  [DATE]

RESULT:     Comparison is made to the previous exam dated [DATE].

The image demonstrates air and fecal material scattered through the colon to
the rectum. Is no abnormal bowel distention. There is no foreign body or
definite nephrolithiasis. No ureterolithiasis is evident.
IMPRESSION: 1. No definite radiopaque foreign body evident.

## 2010-07-30 ENCOUNTER — Inpatient Hospital Stay: Payer: Self-pay | Admitting: Psychiatry

## 2010-09-12 ENCOUNTER — Emergency Department: Payer: Self-pay | Admitting: Emergency Medicine

## 2010-09-12 IMAGING — CR DG ABDOMEN 3V
1 series · 4 of 4 positions shown · non-contrast
Comparison: none

REASON FOR EXAM: swallowed a razor blade
COMMENTS:

[Series 1: view not recorded · 0.17mm/px · 4 of 4 slices shown]
[im 1/4]
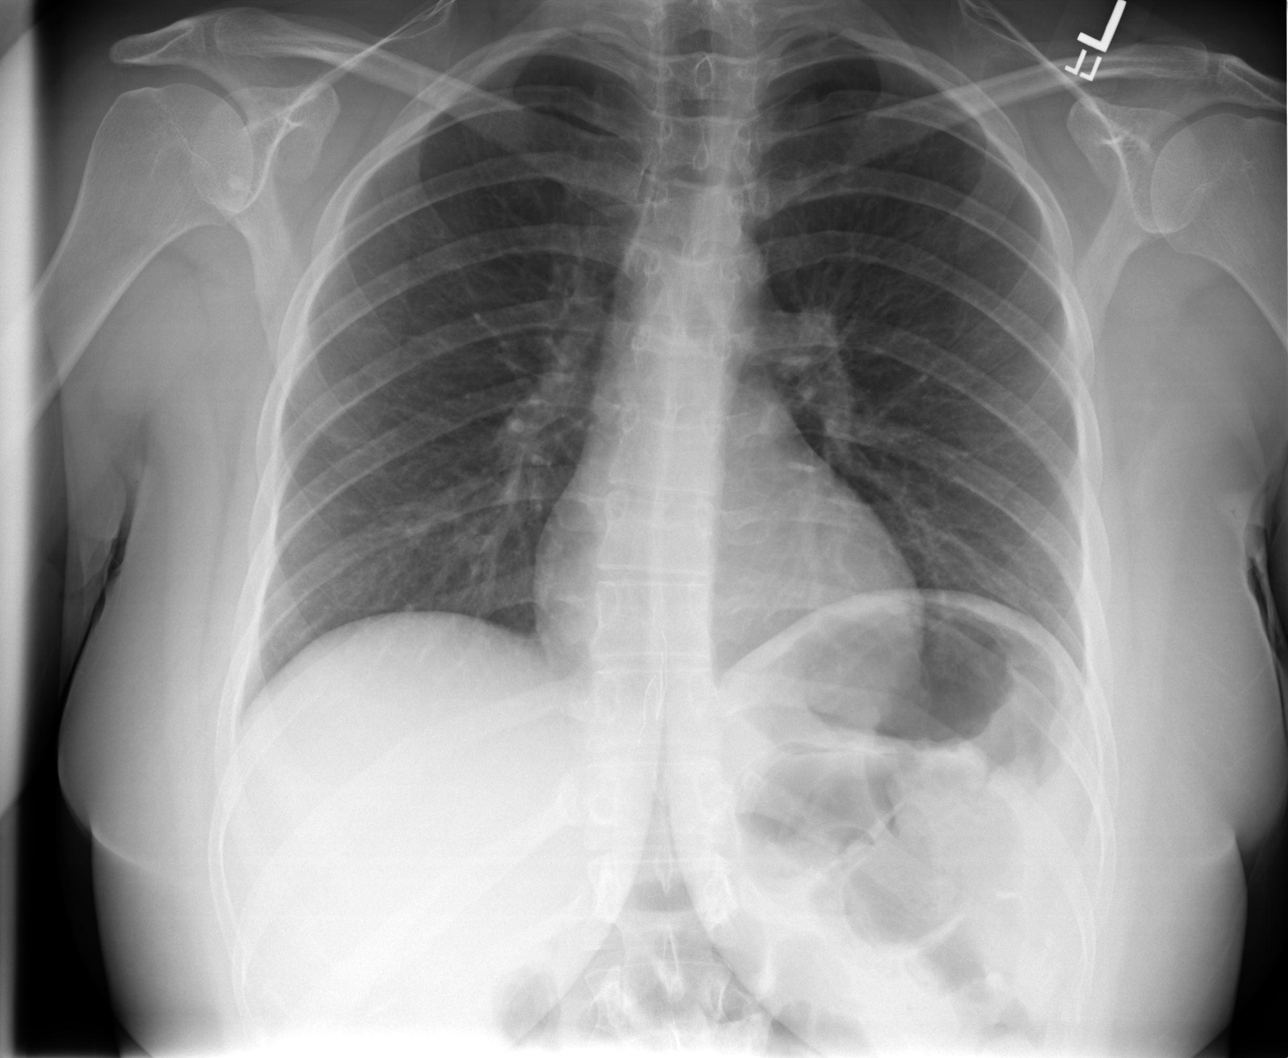
[im 2/4]
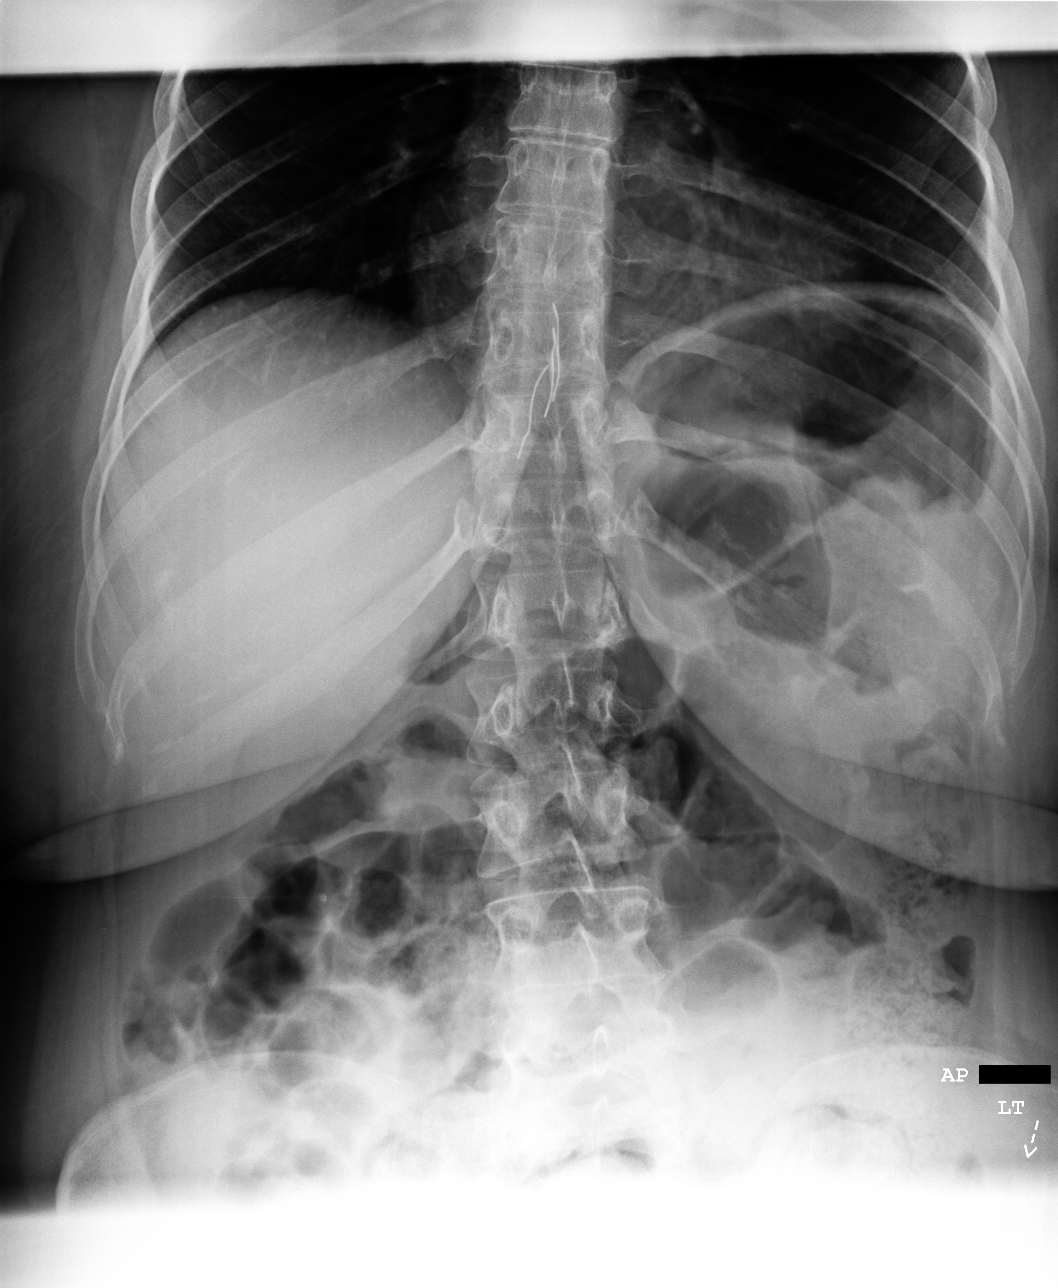
[im 3/4]
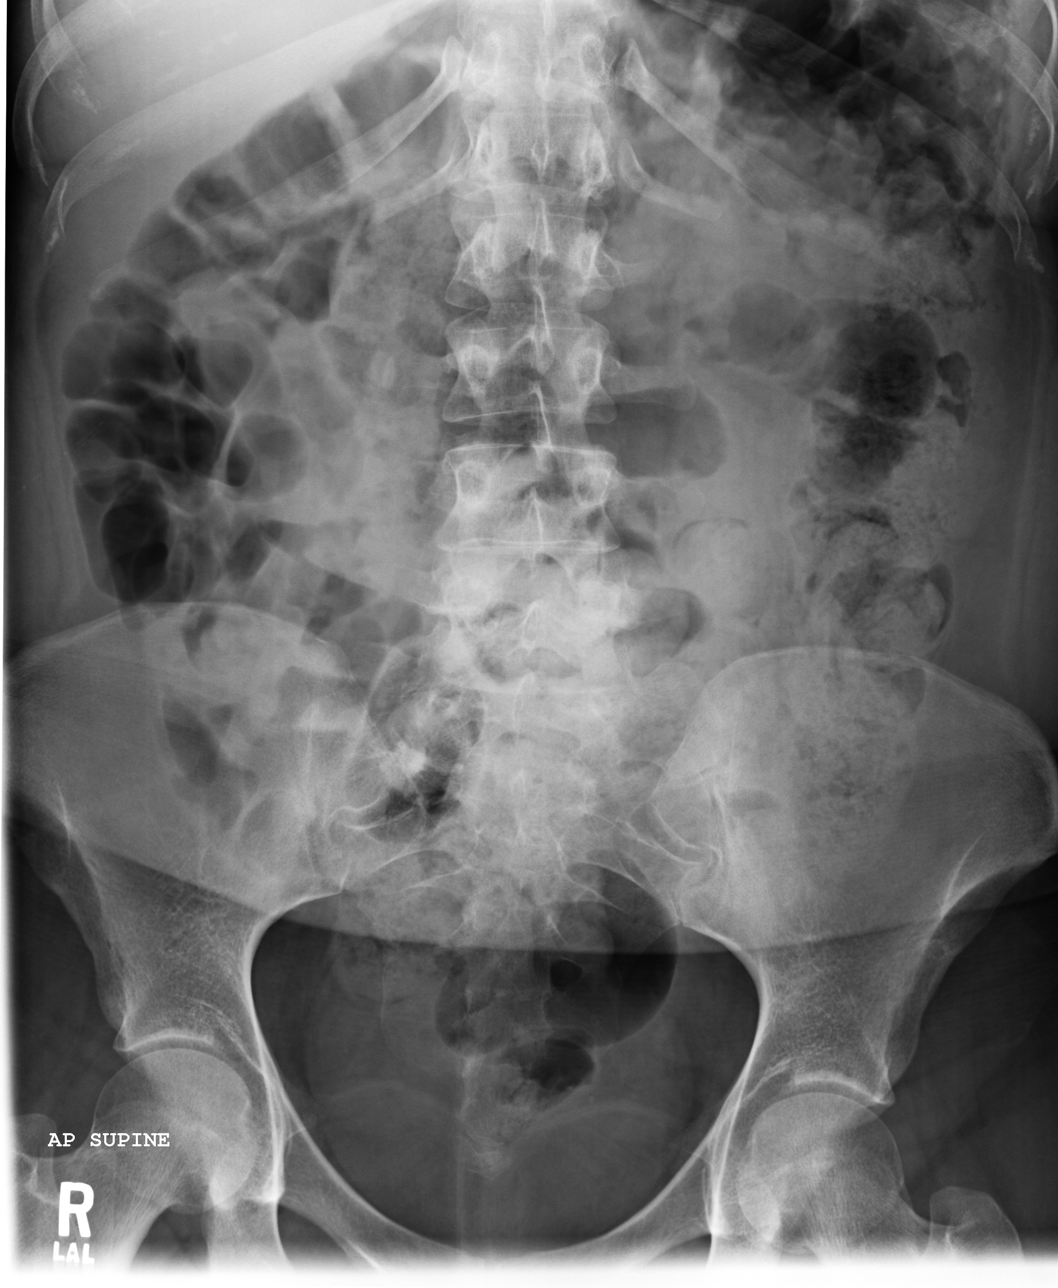
[im 4/4]
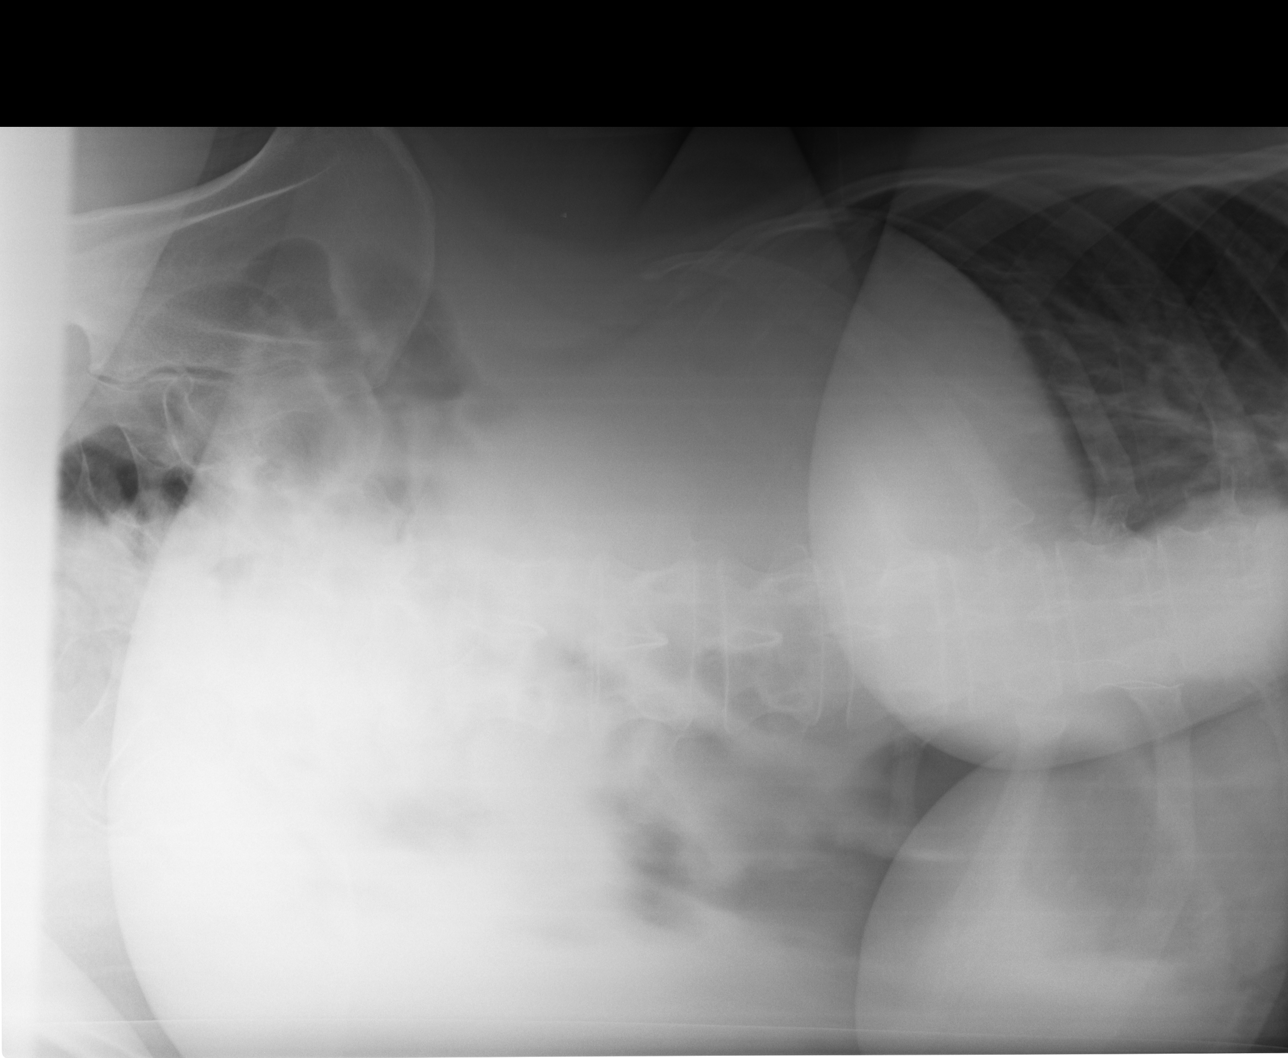

[4 of 4 positions shown; findings below may reference images not displayed]

PROCEDURE:     DXR - DXR ABDOMEN 3-WAY (INCL PA CXR)  - [DATE] [DATE]

RESULT:     PA view of the chest shows the lung fields to be clear. Three
views of the abdomen were obtained. No subdiaphragmatic free air is seen.
Gas is visualized in large and small bowel. No significantly dilated loops
of bowel are seen. No definite air-fluid levels are identified. There is a
moderately large amount of fecal material in the descending colon. No
abnormal intraabdominal calcifications are noted. The osseous structures
show no acute changes.
IMPRESSION: 1. No acute changes are identified.
2. There is a moderately large amount of fecal material in the descending
colon.

## 2010-09-16 ENCOUNTER — Inpatient Hospital Stay: Payer: Self-pay | Admitting: Surgery

## 2010-09-16 IMAGING — CR DG ABDOMEN 3V
1 series · 5 of 5 positions shown · non-contrast
Comparison: none

REASON FOR EXAM: swallowed "pin"
COMMENTS:

[Series 1: view not recorded · 0.17mm/px · 5 of 5 slices shown]
[im 1/5]
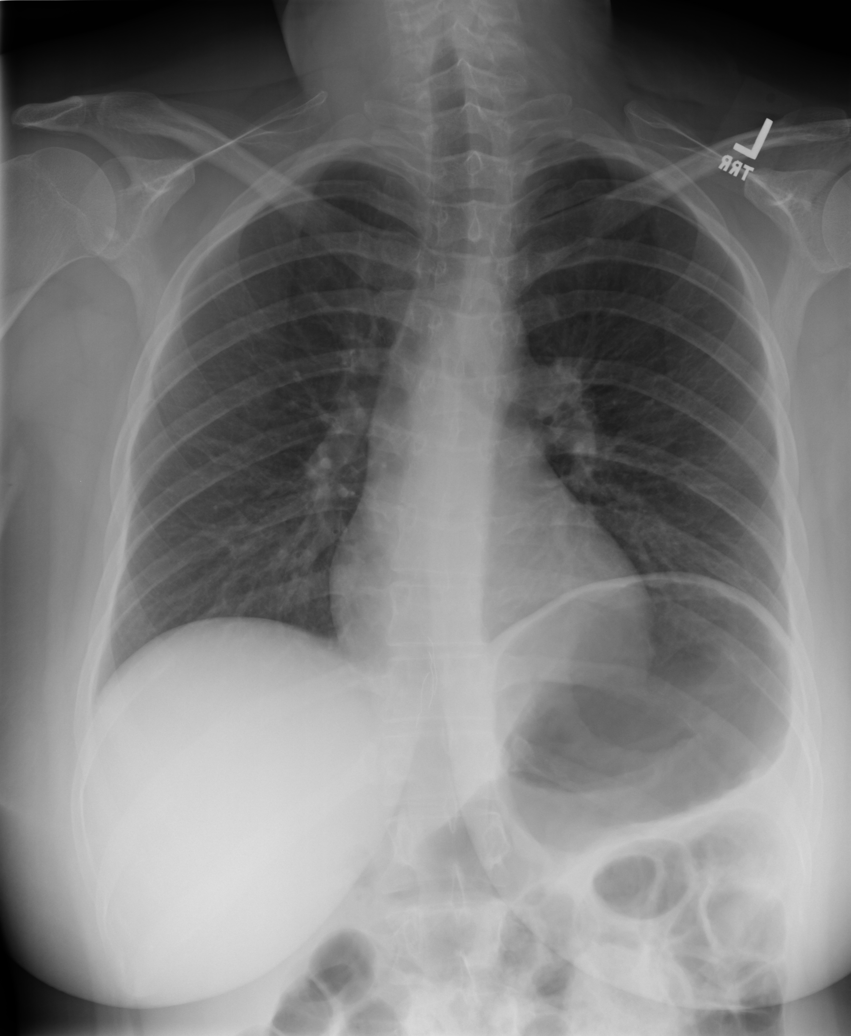
[im 2/5]
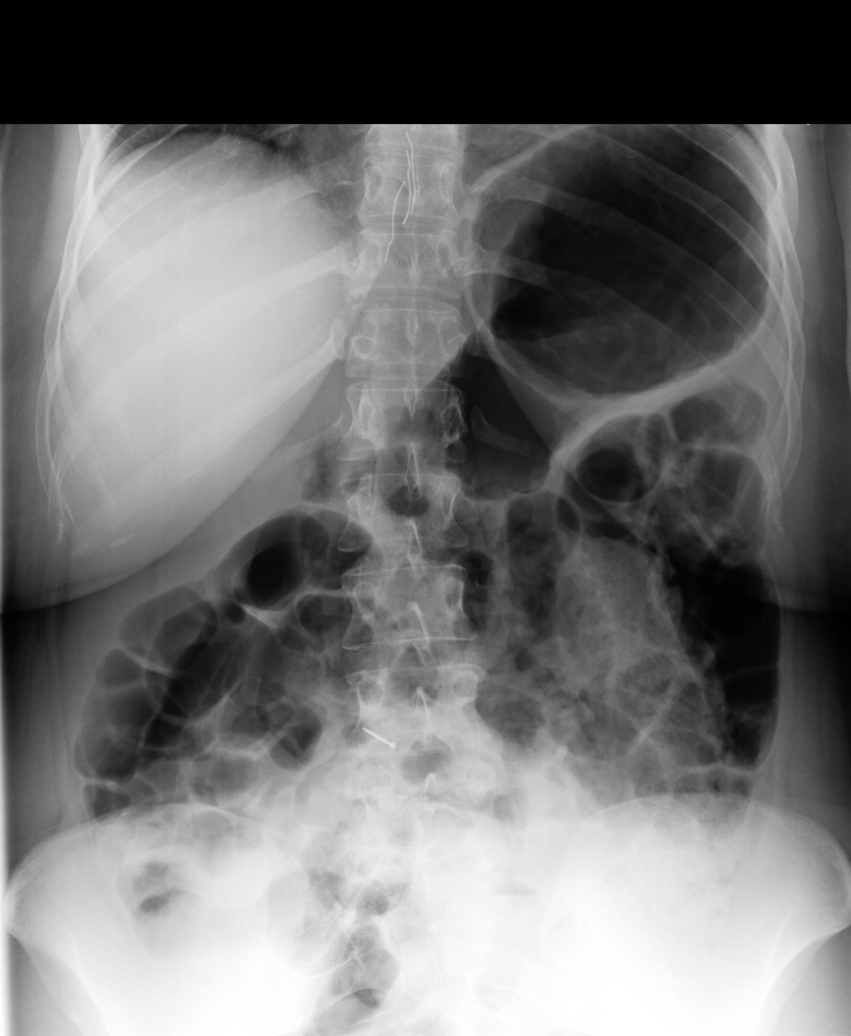
[im 3/5]
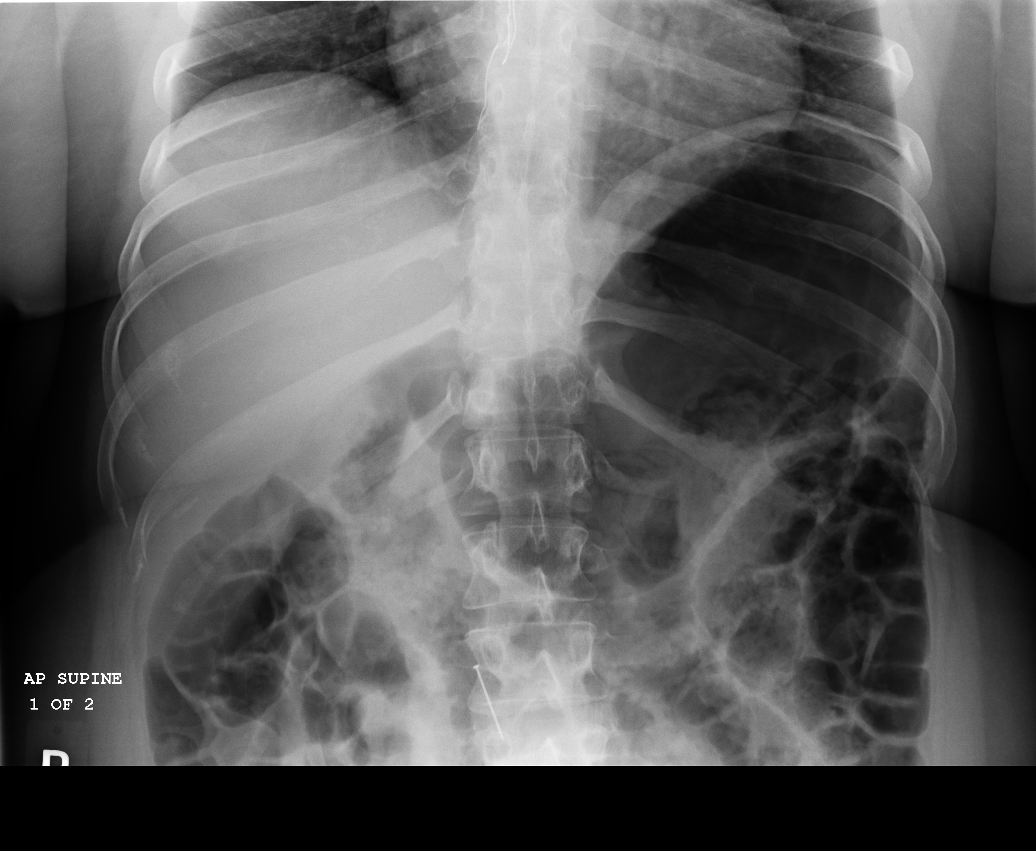
[im 4/5]
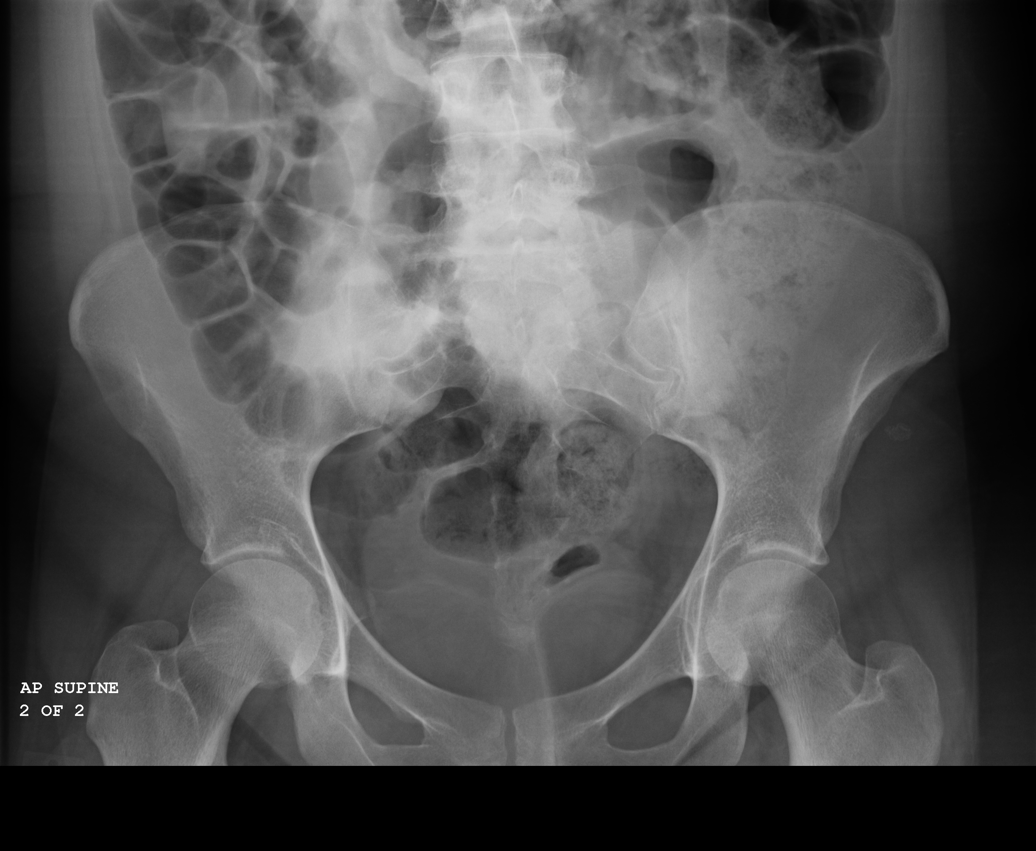
[im 5/5]
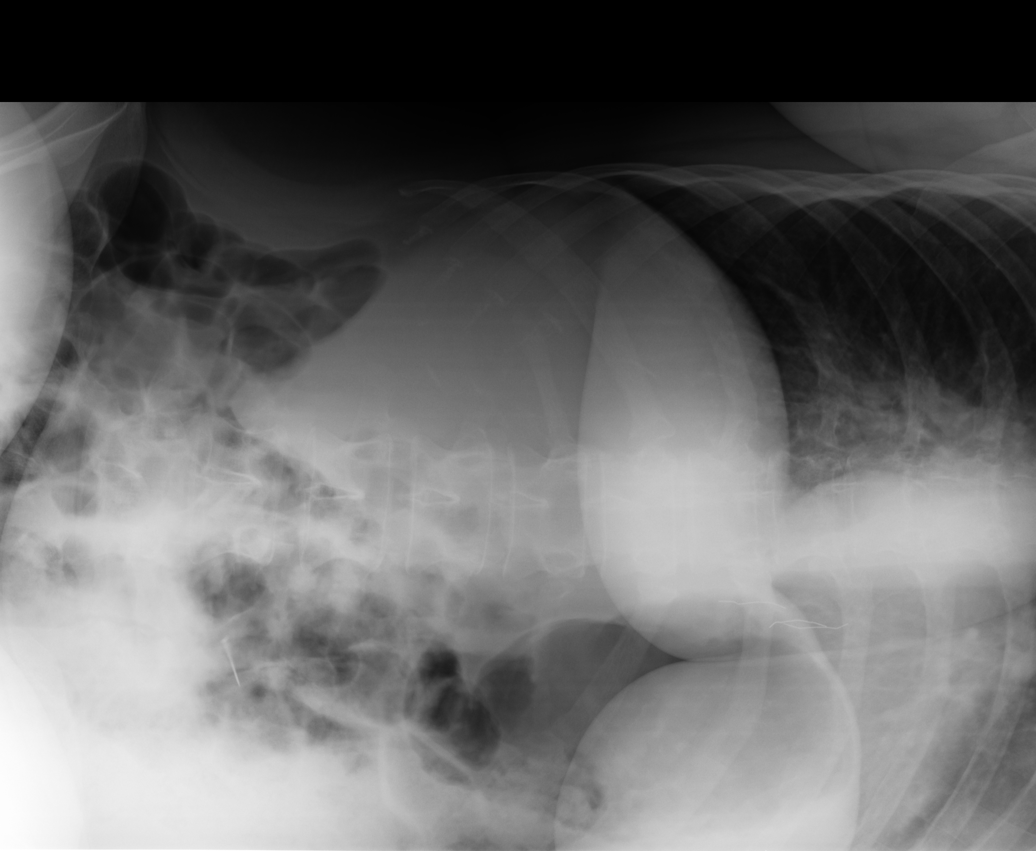

[5 of 5 positions shown; findings below may reference images not displayed]

PROCEDURE:     DXR - DXR ABDOMEN 3-WAY (INCL PA CXR)  - [DATE]  [DATE]

RESULT:     PA view of the chest shows the lung fields to be clear.

Multiple views of the abdomen were obtained. A subdiaphragmatic free air is
seen. There is again noted a metallic density compatible with a straight pin
that is now projected over the mid abdomen and appears to be within jejunum.
No bowel obstruction is evident. No abnormal intra-abdominal calcifications
are seen. No significant osseous abnormalities are noted.
IMPRESSION: 1.  The previously observed straight pin is now projected over the small
bowel, likely the jejunum.
2.  No subdiaphragmatic free air is seen.

## 2010-09-17 IMAGING — CR DG ABDOMEN 2V
1 series · 3 of 3 positions shown · non-contrast
Comparison: none

REASON FOR EXAM: ingestion foreign body a straight pin
COMMENTS:

[Series 1: view not recorded · 0.17mm/px · 3 of 3 slices shown]
[im 1/3]
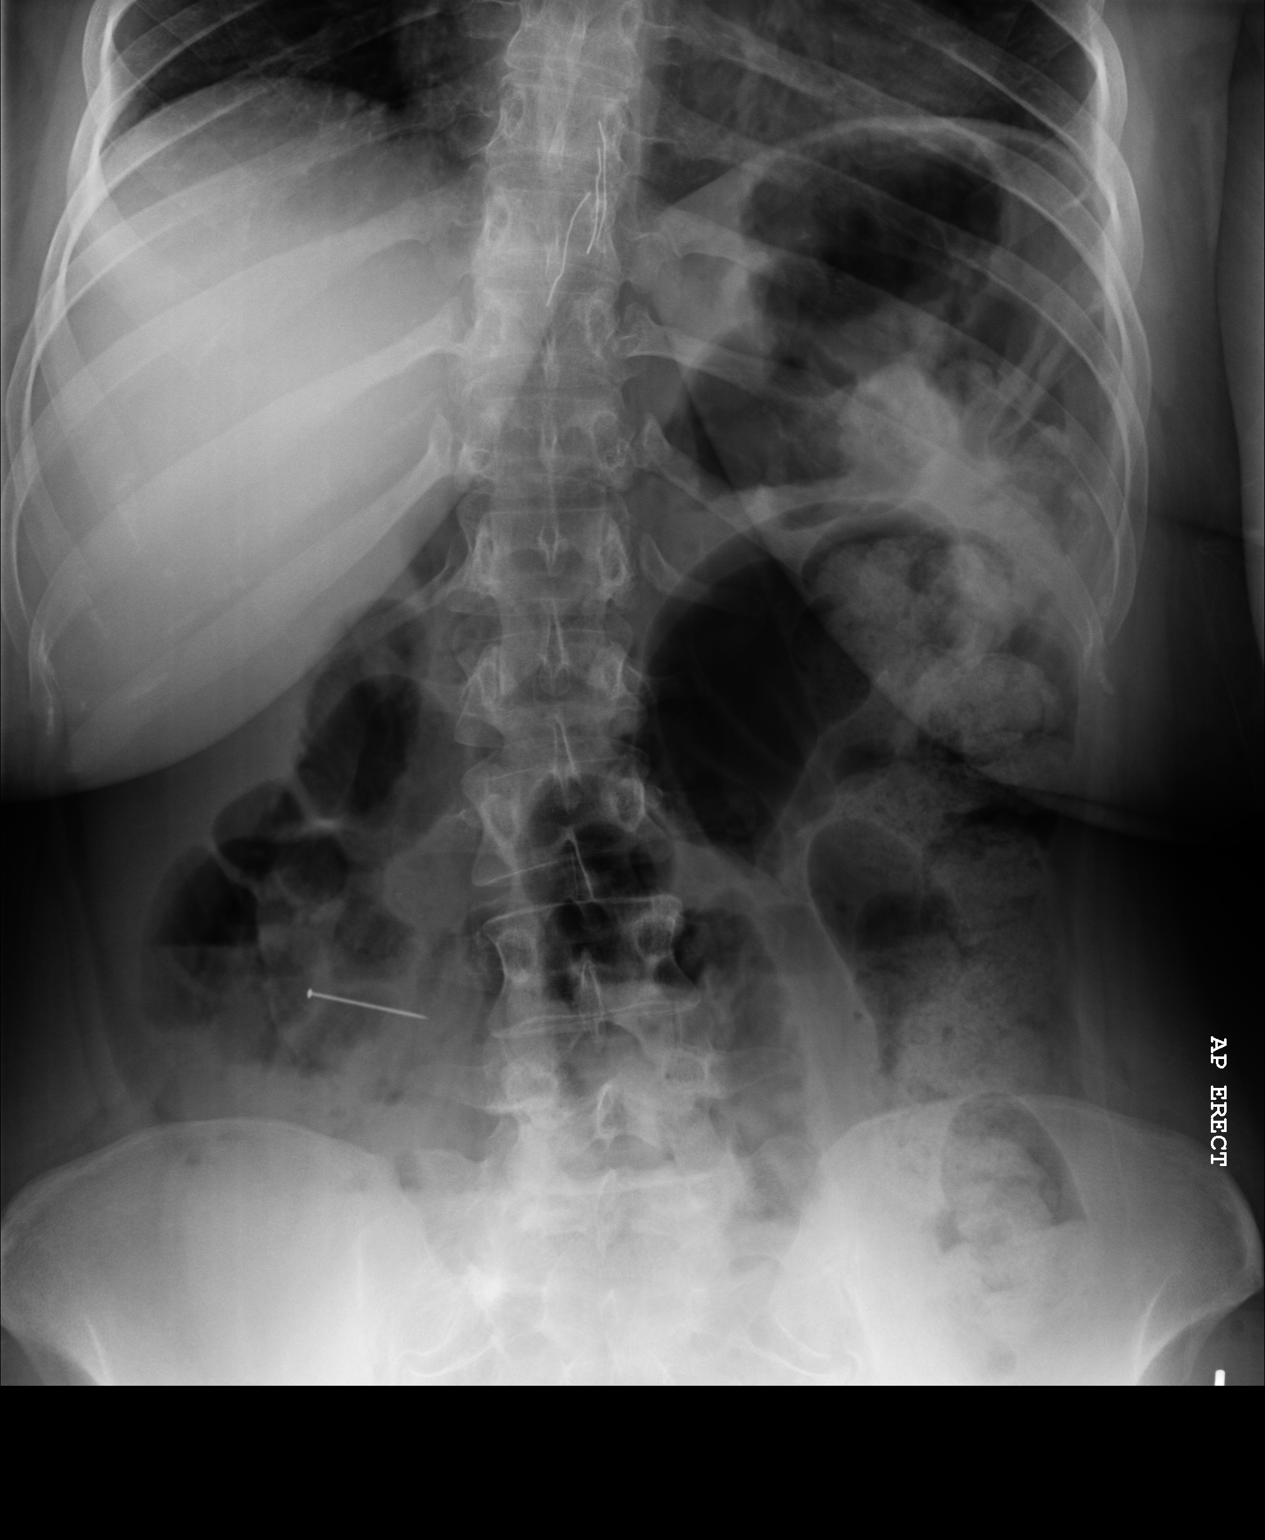
[im 2/3]
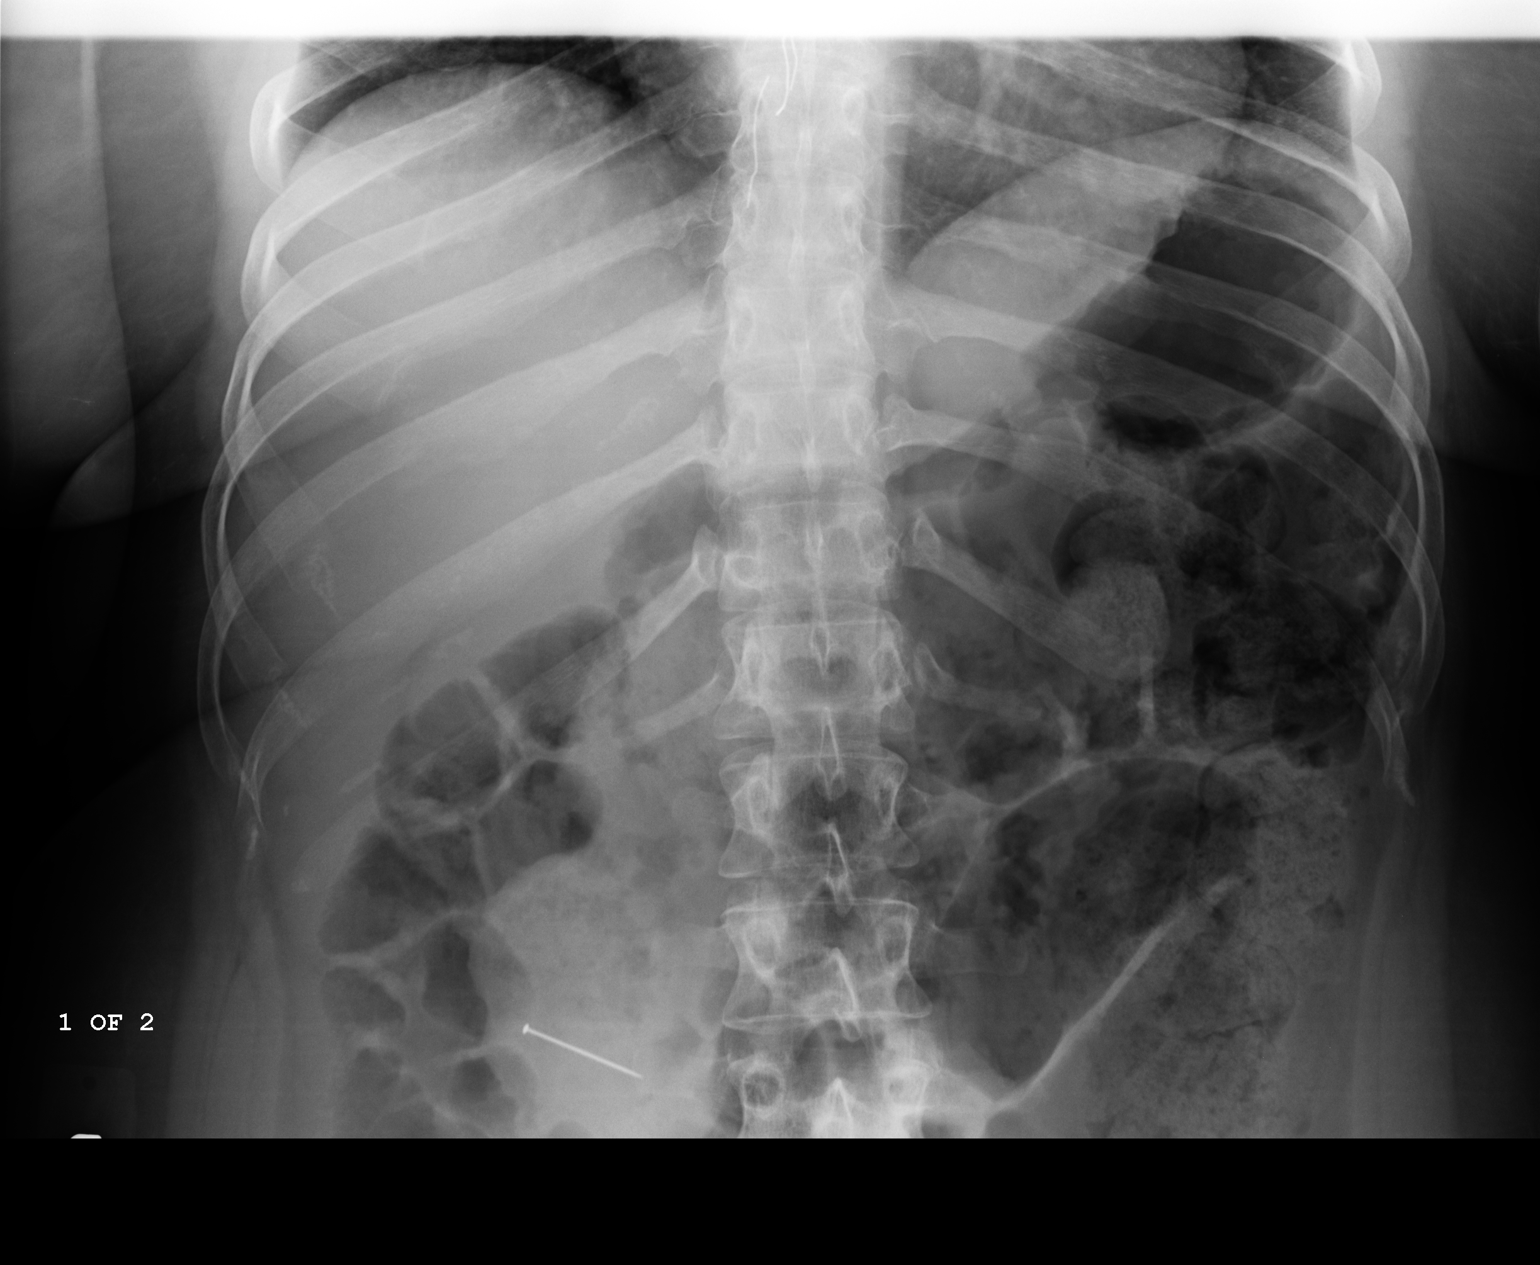
[im 3/3]
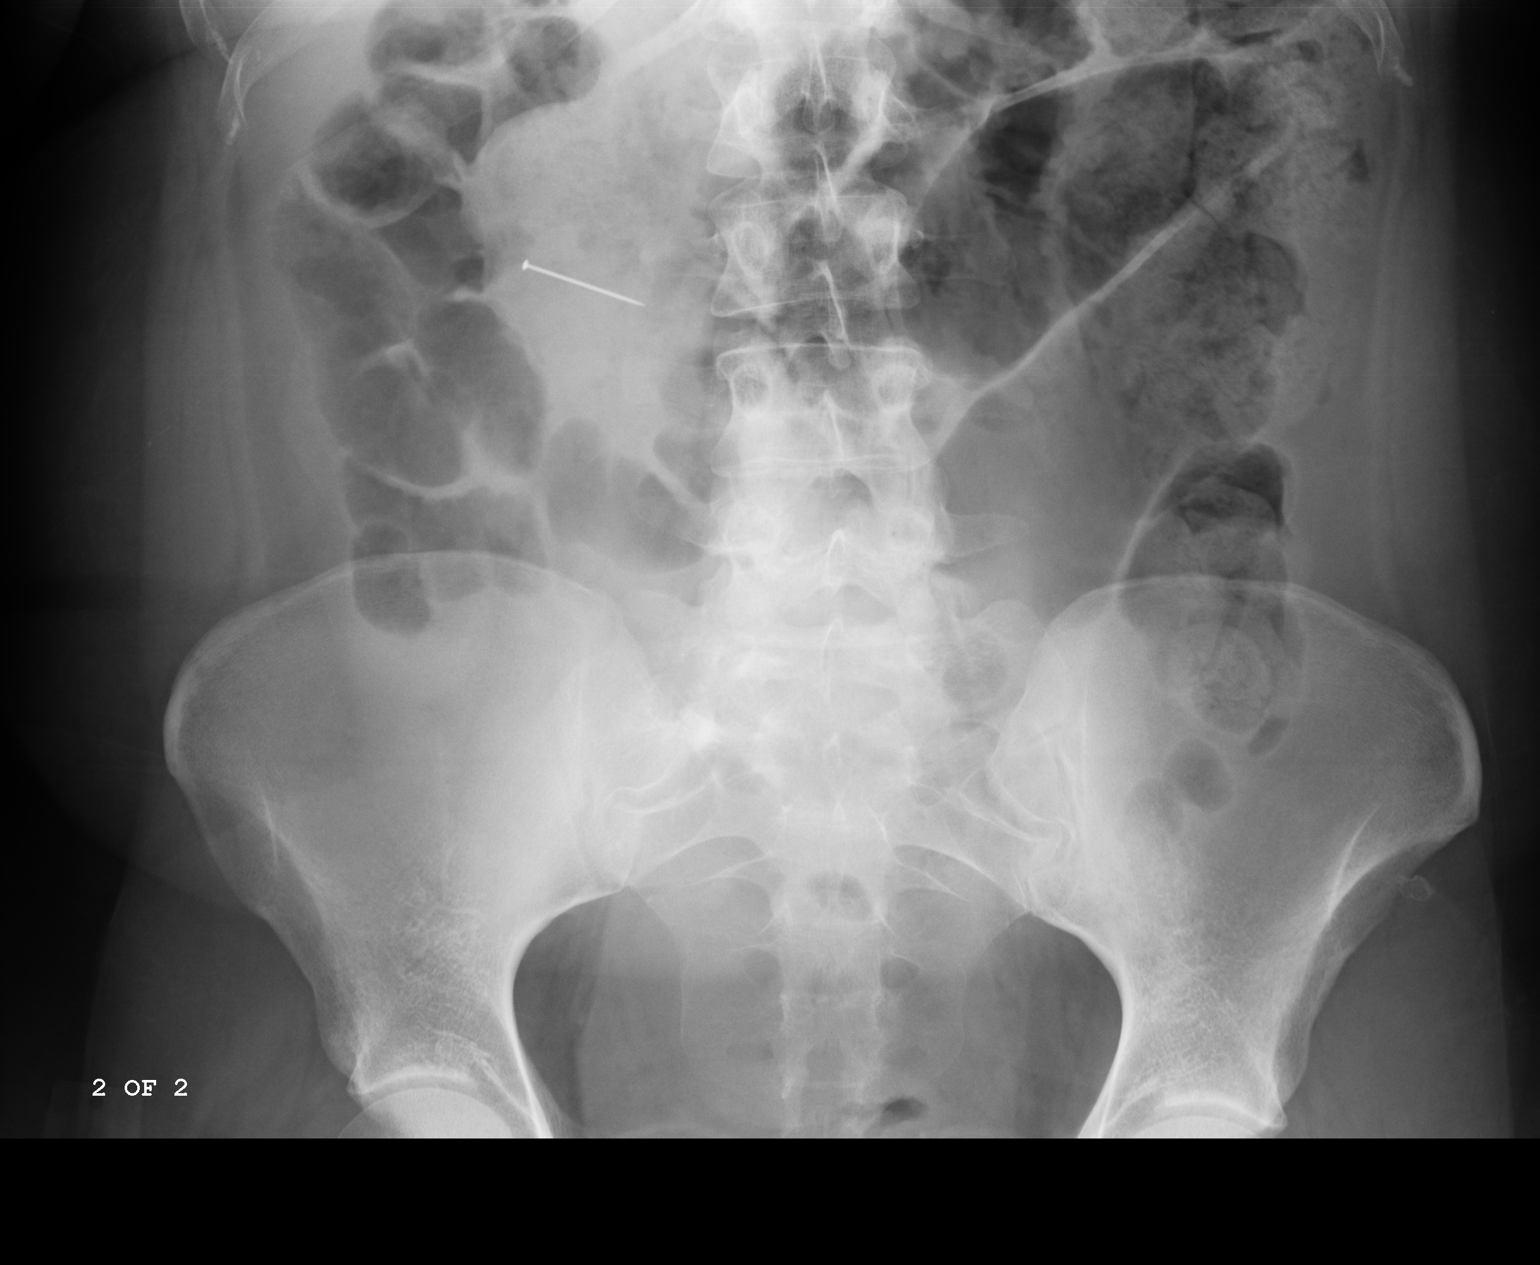

[3 of 3 positions shown; findings below may reference images not displayed]

PROCEDURE:     DXR - DXR ABDOMEN 2 V FLAT AND ERECT  - [DATE]  [DATE]

RESULT:     Comparison is made to the prior exam of [DATE].

No subdiaphragmatic free air is seen. The metallic straight pin previously
observed is now projected over the right lower quadrant and is likely in
ileum. In the supine views, there is an apparent soft tissue density about
the area of the pin. This does not persist in the erect view and is of
doubtful clinical significance.
IMPRESSION: 1.  No subdiaphragmatic free air is seen.
2.  The previously noted metallic straight pin is now projected over the
right lower quadrant.

## 2010-09-20 ENCOUNTER — Inpatient Hospital Stay: Payer: Self-pay | Admitting: Psychiatry

## 2010-09-20 IMAGING — CR DG ABDOMEN 1V
1 series · 2 of 2 positions shown · non-contrast
Comparison: none

REASON FOR EXAM: ingestion of foreign body
COMMENTS:

[Series 1: view not recorded · 0.17mm/px · 2 of 2 slices shown]
[im 1/2]
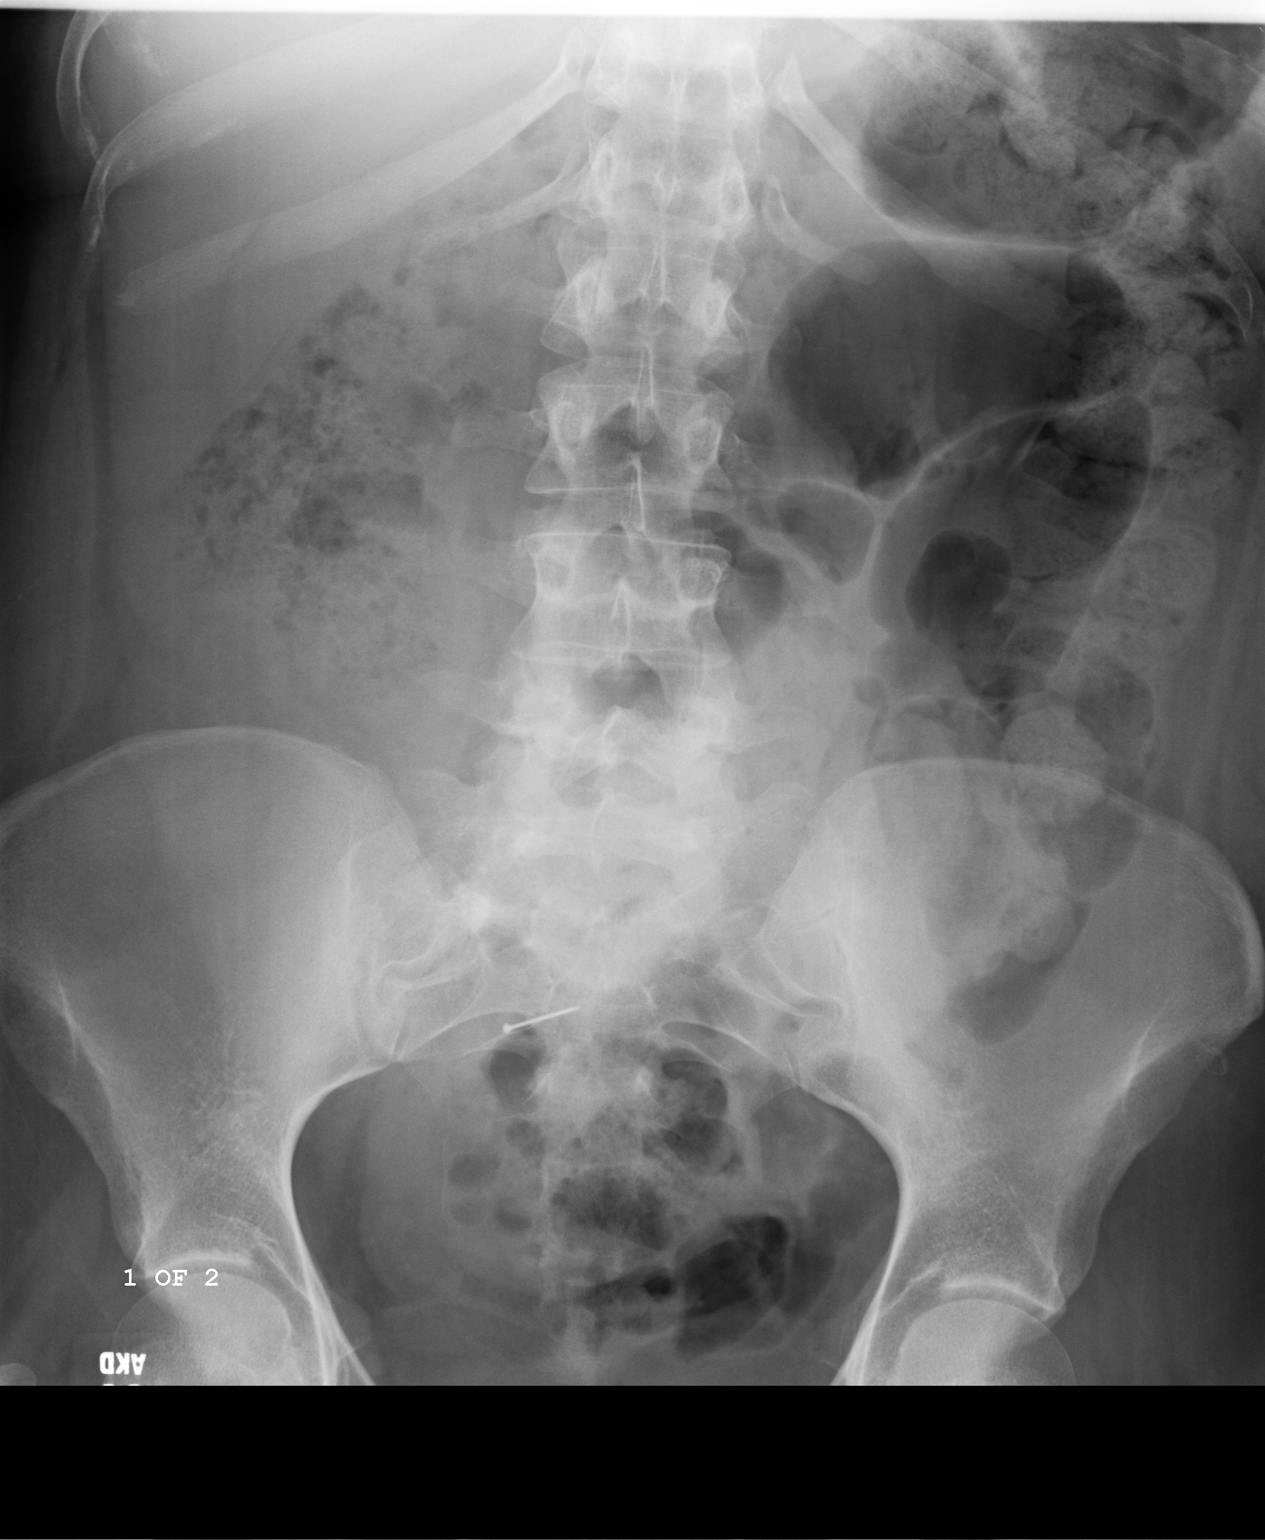
[im 2/2]
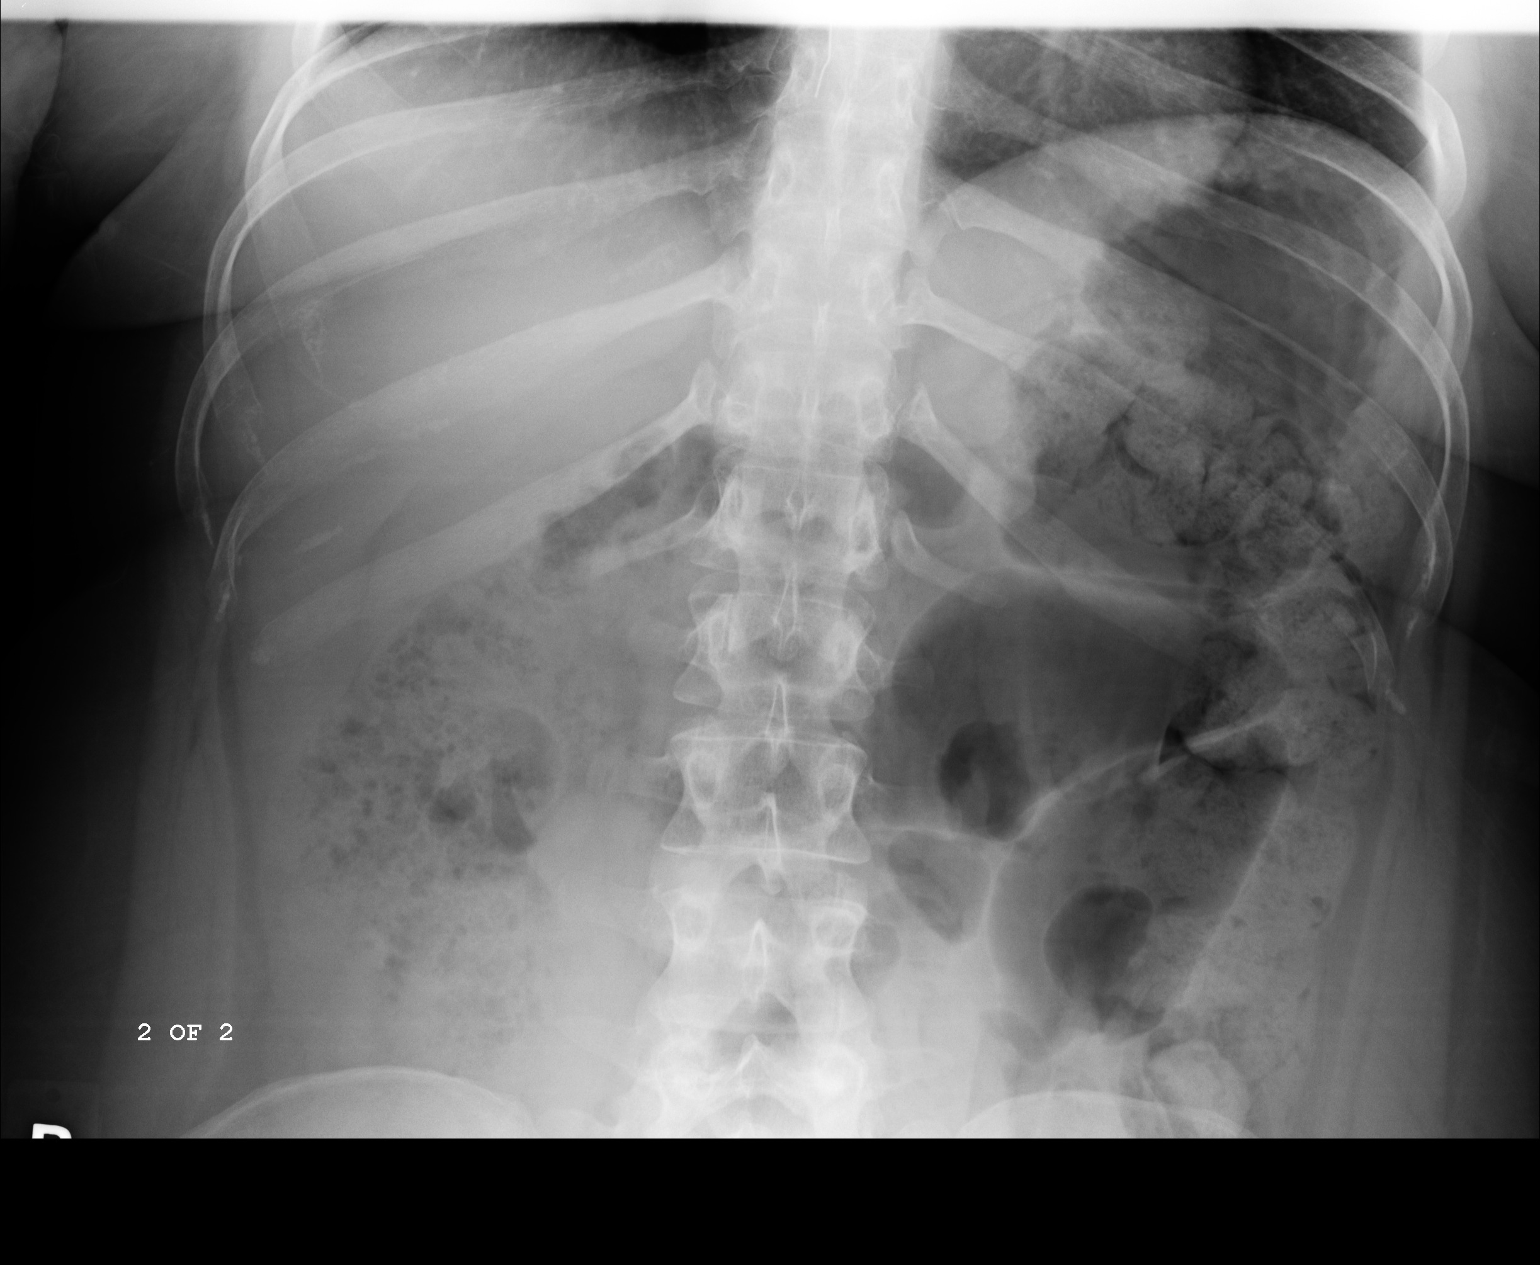

[2 of 2 positions shown; findings below may reference images not displayed]

PROCEDURE:     DXR - DXR KIDNEY URETER BLADDER  - [DATE]  [DATE]

RESULT:     Flat and erect views were obtained. No subdiaphragmatic free air
is seen. There is again noted a metallic linear density compatible with a
straight pin. The pin is projected over the right lower quadrant and it is
difficult to be certain of the exact location. The pin is noted to be lower
in position than on the prior exam and likely is in the ileum or sigmoid
colon. There is a moderate amount of fecal material in the colon. No acute
bony abnormalities are seen.
IMPRESSION: A metallic straight in is again visualized and appears to be
located in the region of the distal ileum or sigmoid colon.

## 2010-09-21 DIAGNOSIS — F259 Schizoaffective disorder, unspecified: Secondary | ICD-10-CM

## 2010-09-22 IMAGING — CR DG ABDOMEN 1V
1 series · 1 of 1 positions shown · non-contrast
Comparison: none

REASON FOR EXAM: Ingestion of pin followup xray
COMMENTS:

[view not recorded]
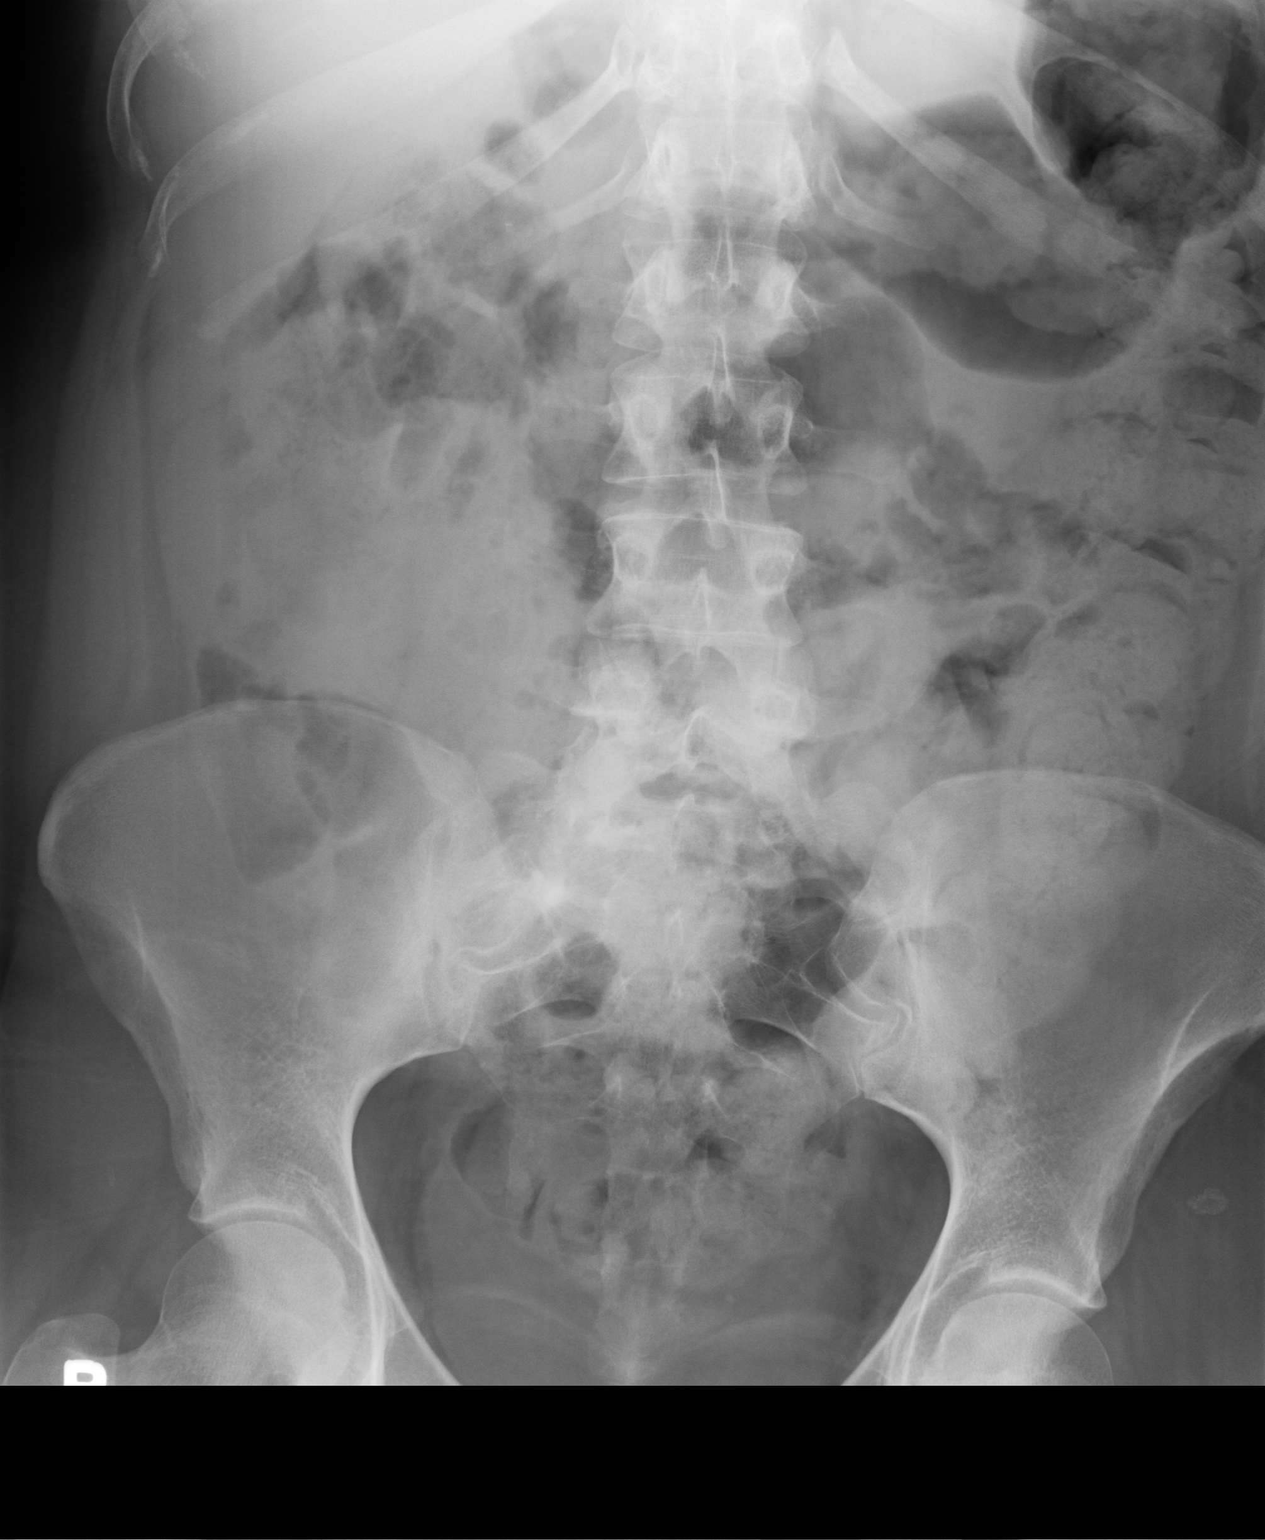

[1 of 1 positions shown; findings below may reference images not displayed]

PROCEDURE:     DXR - DXR KIDNEY URETER BLADDER  - [DATE]  [DATE]

RESULT:     An AP view of the abdomen is compared to the prior exam of
[DATE].

The metallic straight pin previously observed in the lower mid abdomen is no
longer seen and apparently has been passed. There is a moderately large
amount of fecal material in the colon. No abnormal intra-abdominal
calcifications are seen. No acute bony abnormalities are noted.
IMPRESSION: No foreign body is identified in the GI tract on the current
exam. The previously noted metallic pin is no longer seen.

## 2011-10-30 LAB — COMPREHENSIVE METABOLIC PANEL
Alkaline Phosphatase: 109 U/L (ref 50–136)
Anion Gap: 9 (ref 7–16)
BUN: 8 mg/dL (ref 7–18)
Chloride: 110 mmol/L — ABNORMAL HIGH (ref 98–107)
Co2: 22 mmol/L (ref 21–32)
Glucose: 86 mg/dL (ref 65–99)
Osmolality: 279 (ref 275–301)
SGOT(AST): 16 U/L (ref 15–37)

## 2011-10-30 LAB — URINALYSIS, COMPLETE
Bilirubin,UR: NEGATIVE
Glucose,UR: NEGATIVE mg/dL (ref 0–75)
Leukocyte Esterase: NEGATIVE
Nitrite: NEGATIVE
Ph: 7 (ref 4.5–8.0)
Protein: NEGATIVE
Specific Gravity: 1.003 (ref 1.003–1.030)
Squamous Epithelial: 1

## 2011-10-30 LAB — ETHANOL
Ethanol %: <0.003 % (ref 0.000–0.080)
Ethanol: <3 mg/dL

## 2011-10-30 LAB — DRUG SCREEN, URINE
Amphetamines, Ur Screen: NEGATIVE (ref ?–1000)
Barbiturates, Ur Screen: NEGATIVE (ref ?–200)
Cannabinoid 50 Ng, Ur ~~LOC~~: NEGATIVE (ref ?–50)
Opiate, Ur Screen: NEGATIVE (ref ?–300)
Tricyclic, Ur Screen: NEGATIVE (ref ?–1000)

## 2011-10-30 LAB — CBC
HGB: 11.7 g/dL — ABNORMAL LOW (ref 12.0–16.0)
Platelet: 234 10*3/uL (ref 150–440)
RBC: 4.12 10*6/uL (ref 3.80–5.20)
WBC: 12.1 10*3/uL — ABNORMAL HIGH (ref 3.6–11.0)

## 2011-10-30 LAB — SALICYLATE LEVEL: Salicylates, Serum: 3.2 mg/dL — ABNORMAL HIGH

## 2011-10-30 LAB — TSH: Thyroid Stimulating Horm: 2.2 u[IU]/mL

## 2011-10-30 IMAGING — CR DG CHEST 2V
1 series · 2 of 2 positions shown · non-contrast
Comparison: none

REASON FOR EXAM: swallowed  foreigh body
COMMENTS:

[Series 1: pa · 0.17mm/px · 2 of 2 slices shown]
[im 1/2]
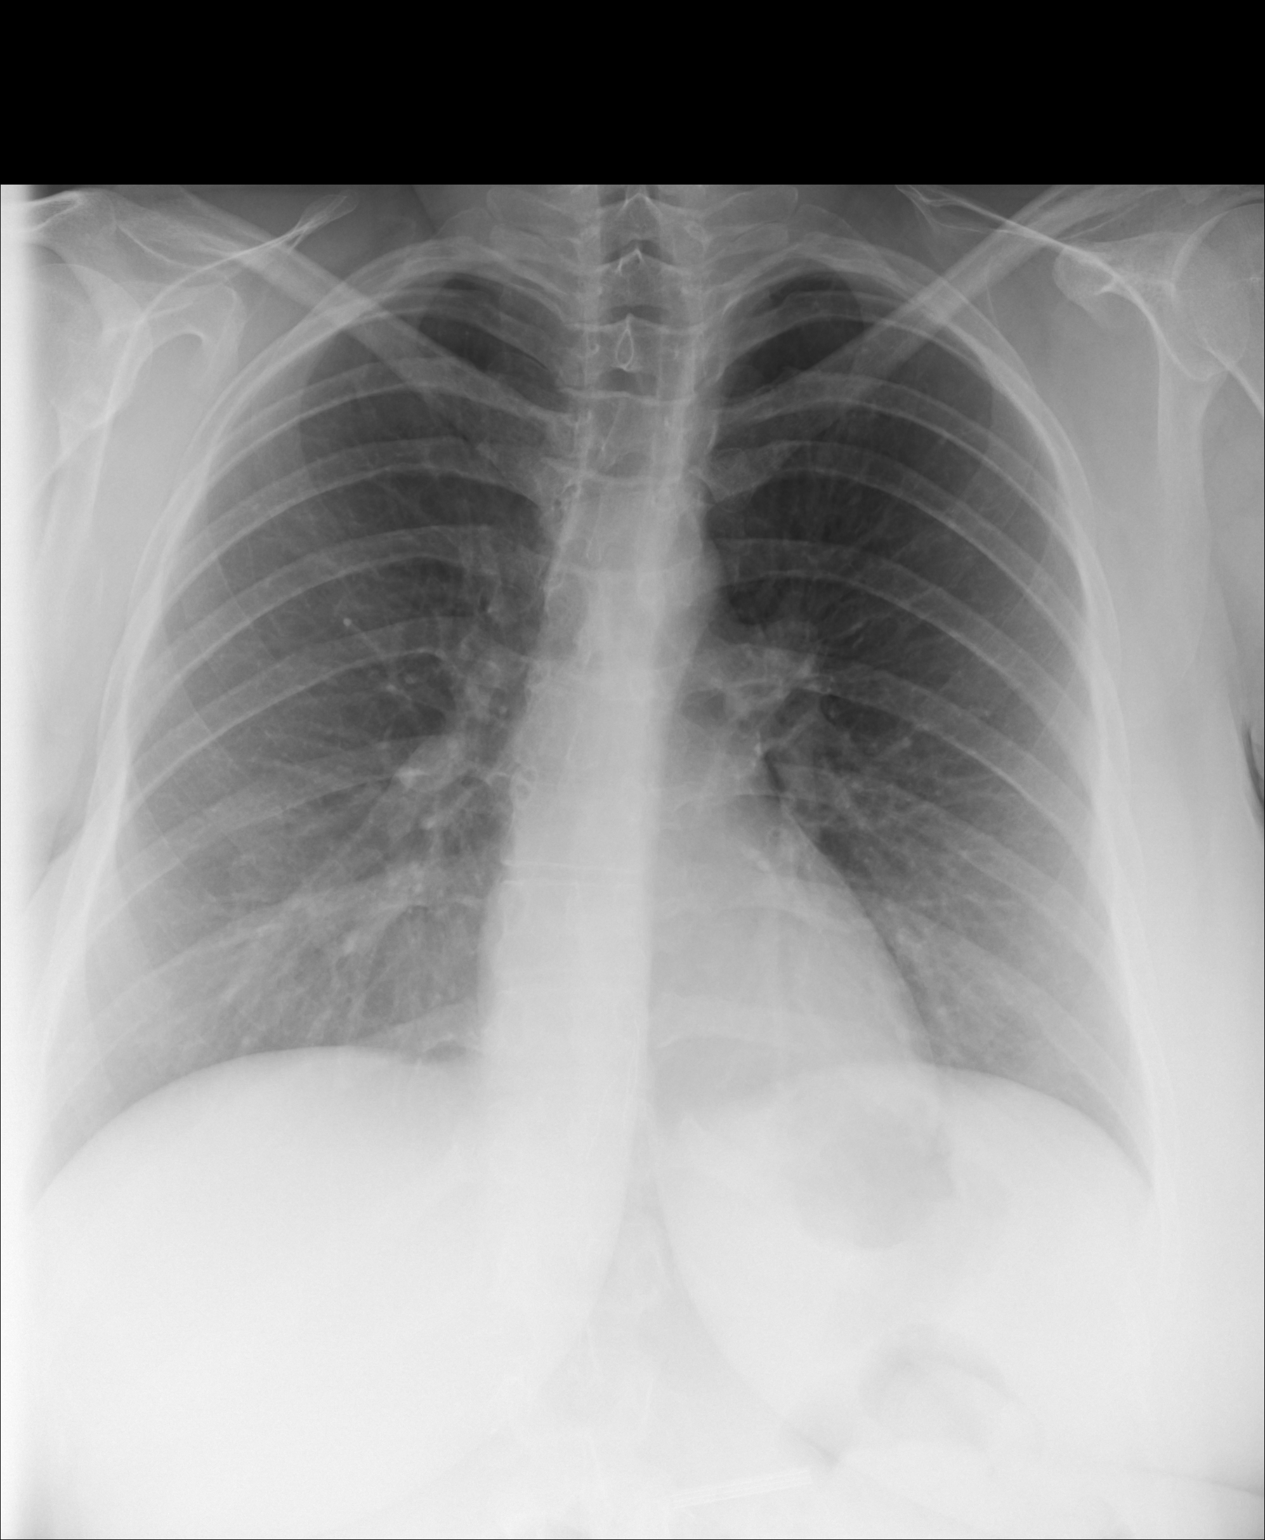
[im 2/2]
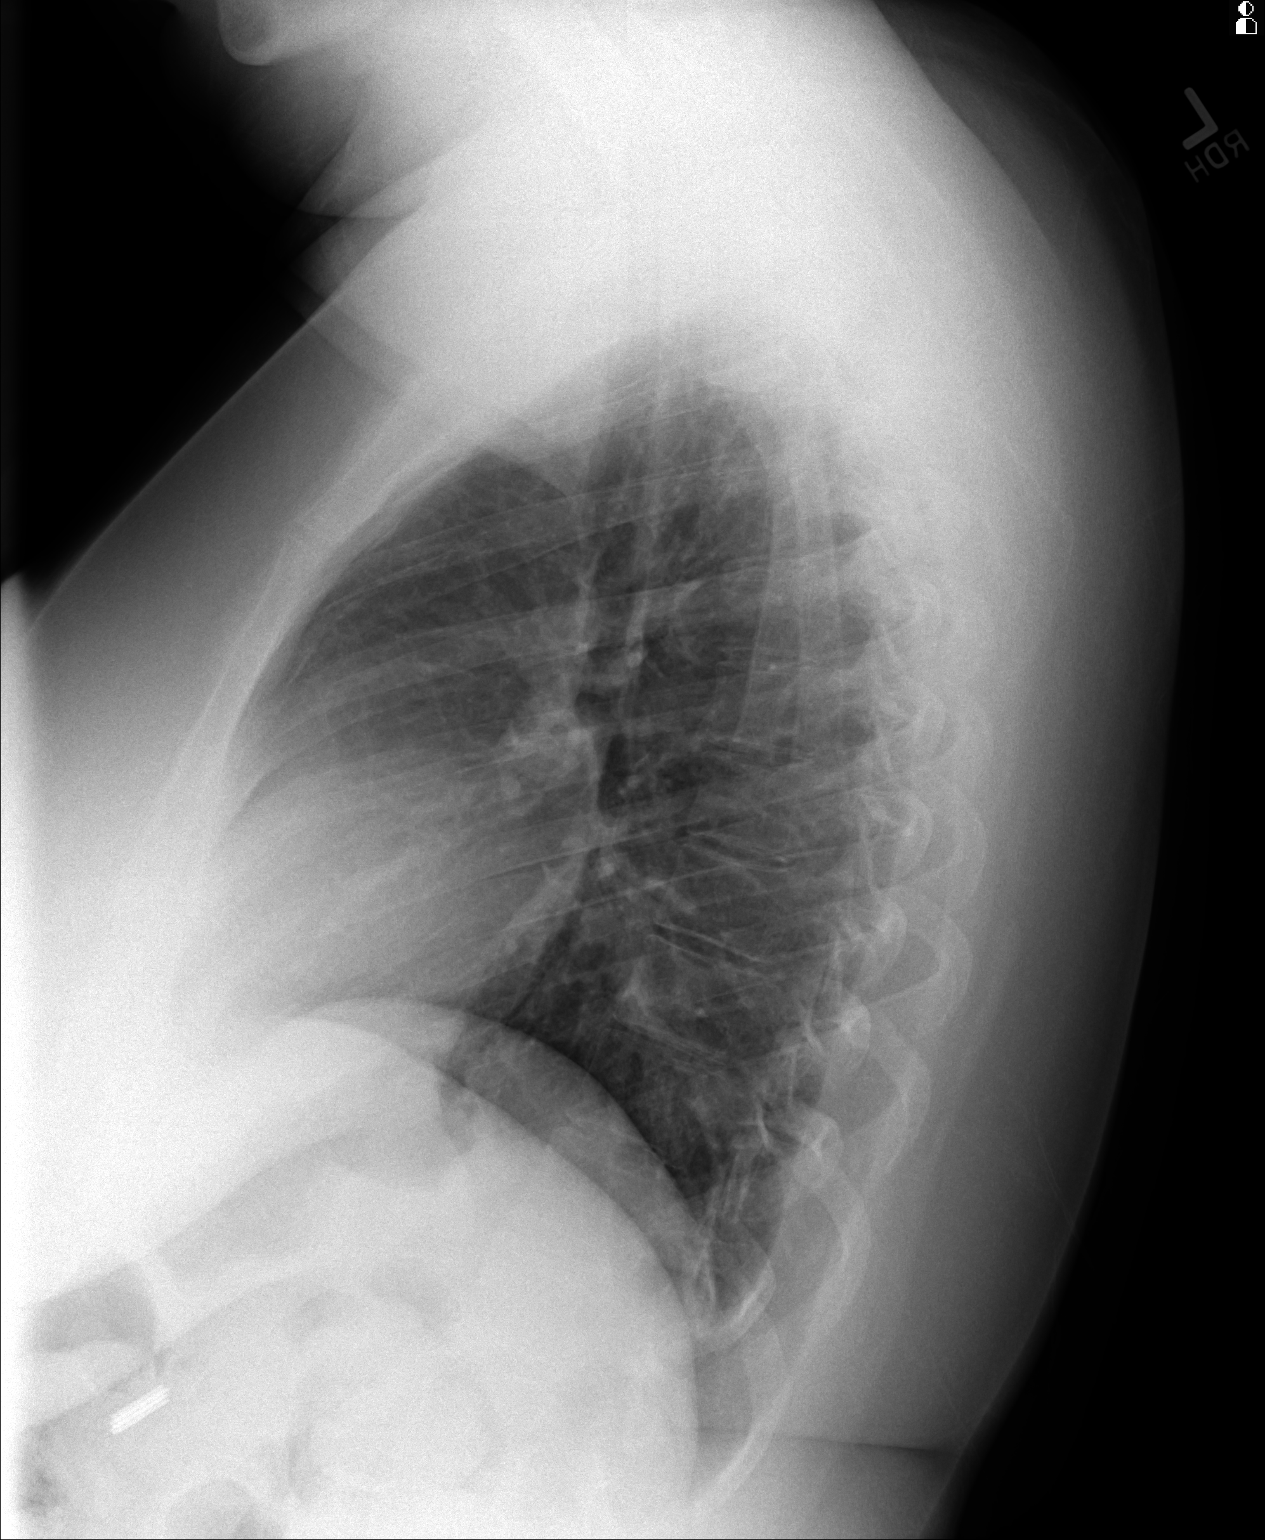

[2 of 2 positions shown; findings below may reference images not displayed]

PROCEDURE:     DXR - DXR CHEST PA (OR AP) AND LATERAL  - [DATE]  [DATE]

RESULT:     The lungs are well-expanded and clear. The mediastinum is normal
in width. The cardiac silhouette is normal in size. There is no pleural
effusion. On the lateral film there is a radiodense metallic structure
projecting over the upper abdomen. This is very faintly visible on the
frontal film just above the inferior margin.
IMPRESSION: 1. I do not see evidence of acute cardiopulmonary abnormality.
2. There is a metallic density foreign body that projects over the
epigastrium to the left of midline.

[REDACTED]

## 2011-10-30 IMAGING — CR DG ABDOMEN 2V
1 series · 2 of 2 positions shown · non-contrast
Comparison: none

REASON FOR EXAM: swallowed  foreigh body 8 days ago
COMMENTS:

[Series 1: erect ap · 0.17mm/px · 2 of 2 slices shown]
[im 1/2]
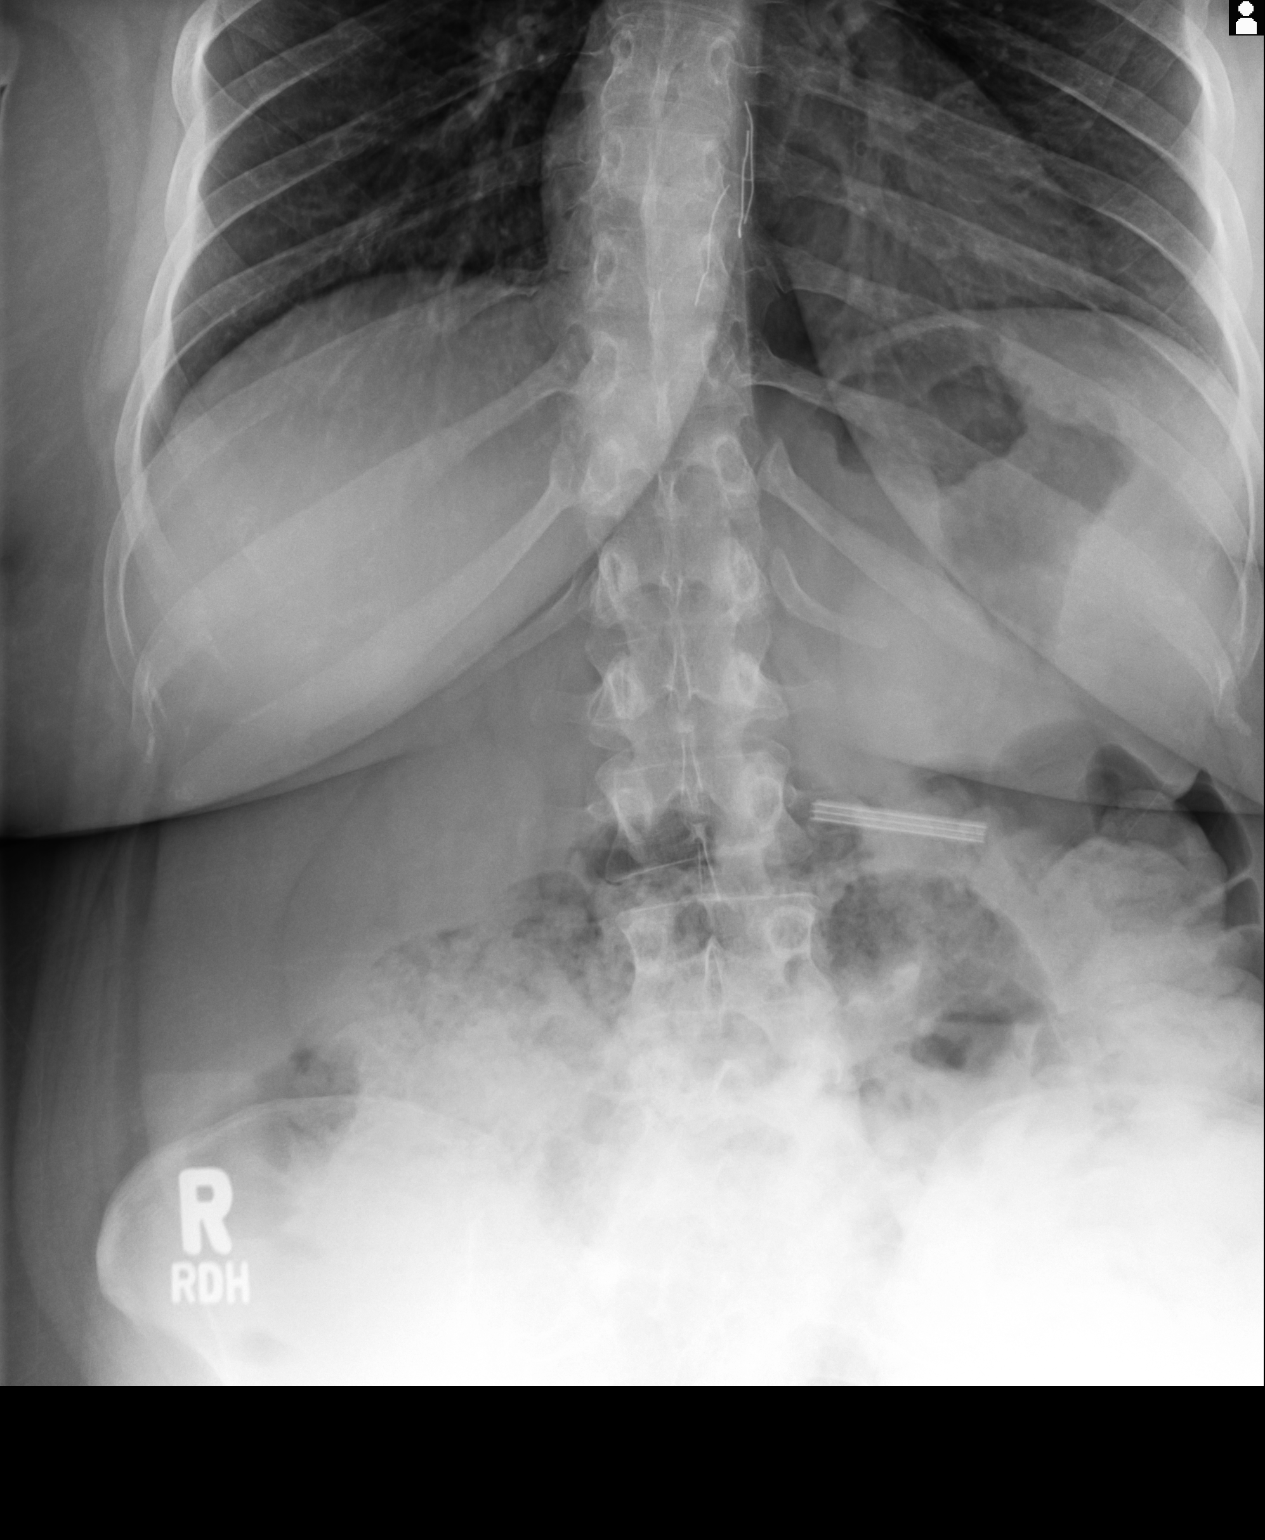
[im 2/2]
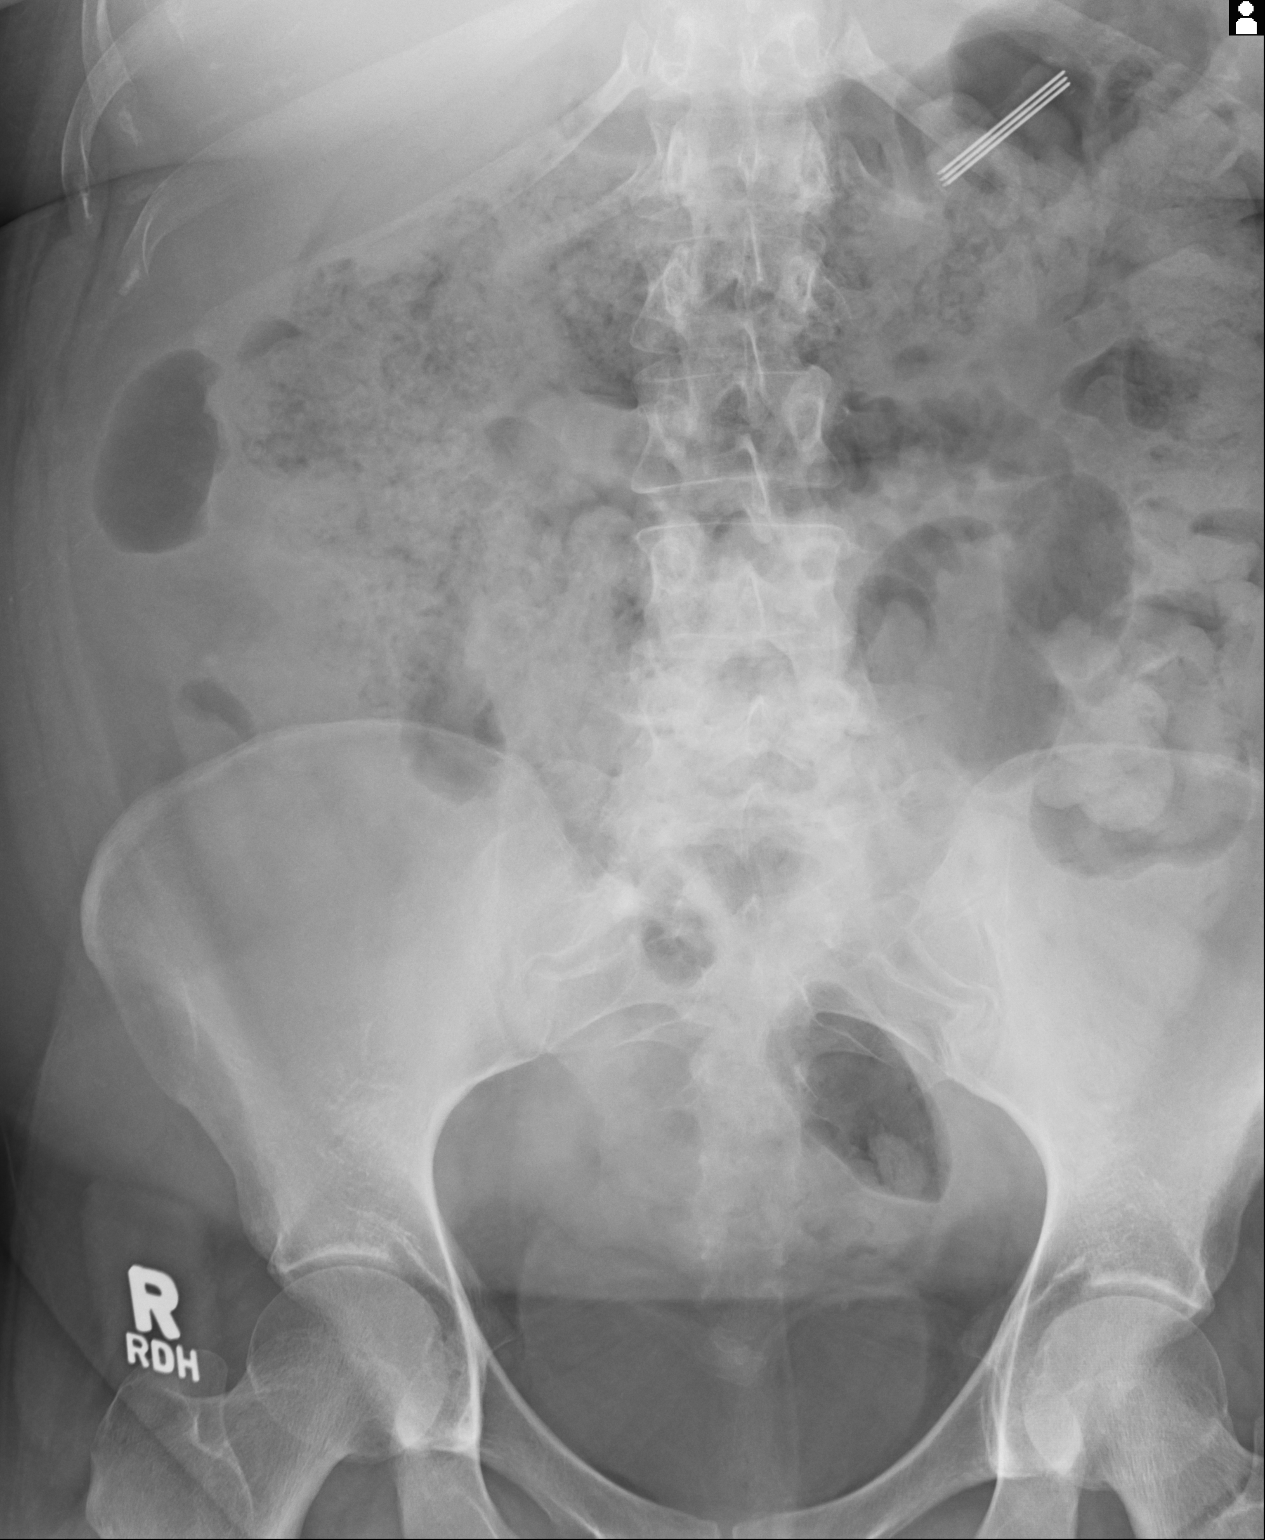

[2 of 2 positions shown; findings below may reference images not displayed]

PROCEDURE:     DXR - DXR ABDOMEN 2 V FLAT AND ERECT  - [DATE]  [DATE]

RESULT:     Supine and upright abdominal films reveal a metallic foreign
body measuring 4.7 centimeters in length by 0.5 cm transversely. This lies
in the left aspect of the upper abdomen. There is no evidence of bowel
obstruction or extraluminal gas. There is likely constipation. The bony
structures exhibit no acute abnormality.
IMPRESSION: There is a metallic foreign body that projects in the left
upper abdomen. I do not see evidence of bowel obstruction. There is likely
constipation present.

## 2011-10-30 IMAGING — CR NECK SOFT TISSUES - 1+ VIEW
1 series · 2 of 2 positions shown · non-contrast
Comparison: none

REASON FOR EXAM: swallowed foreign body
COMMENTS:

PROCEDURE:     DXR - DXR SOFT TISSUE NECK  - [DATE]  [DATE]
RESULT:     AP and lateral views of the cervical spine and soft tissues of
the neck reveal no foreign bodies. The visualized portions of the cervical
spine appear normal. The cervical airway is normal in appearance as well.

[Series 1: ap · 0.17mm/px · 2 of 2 slices shown]
[im 1/2]
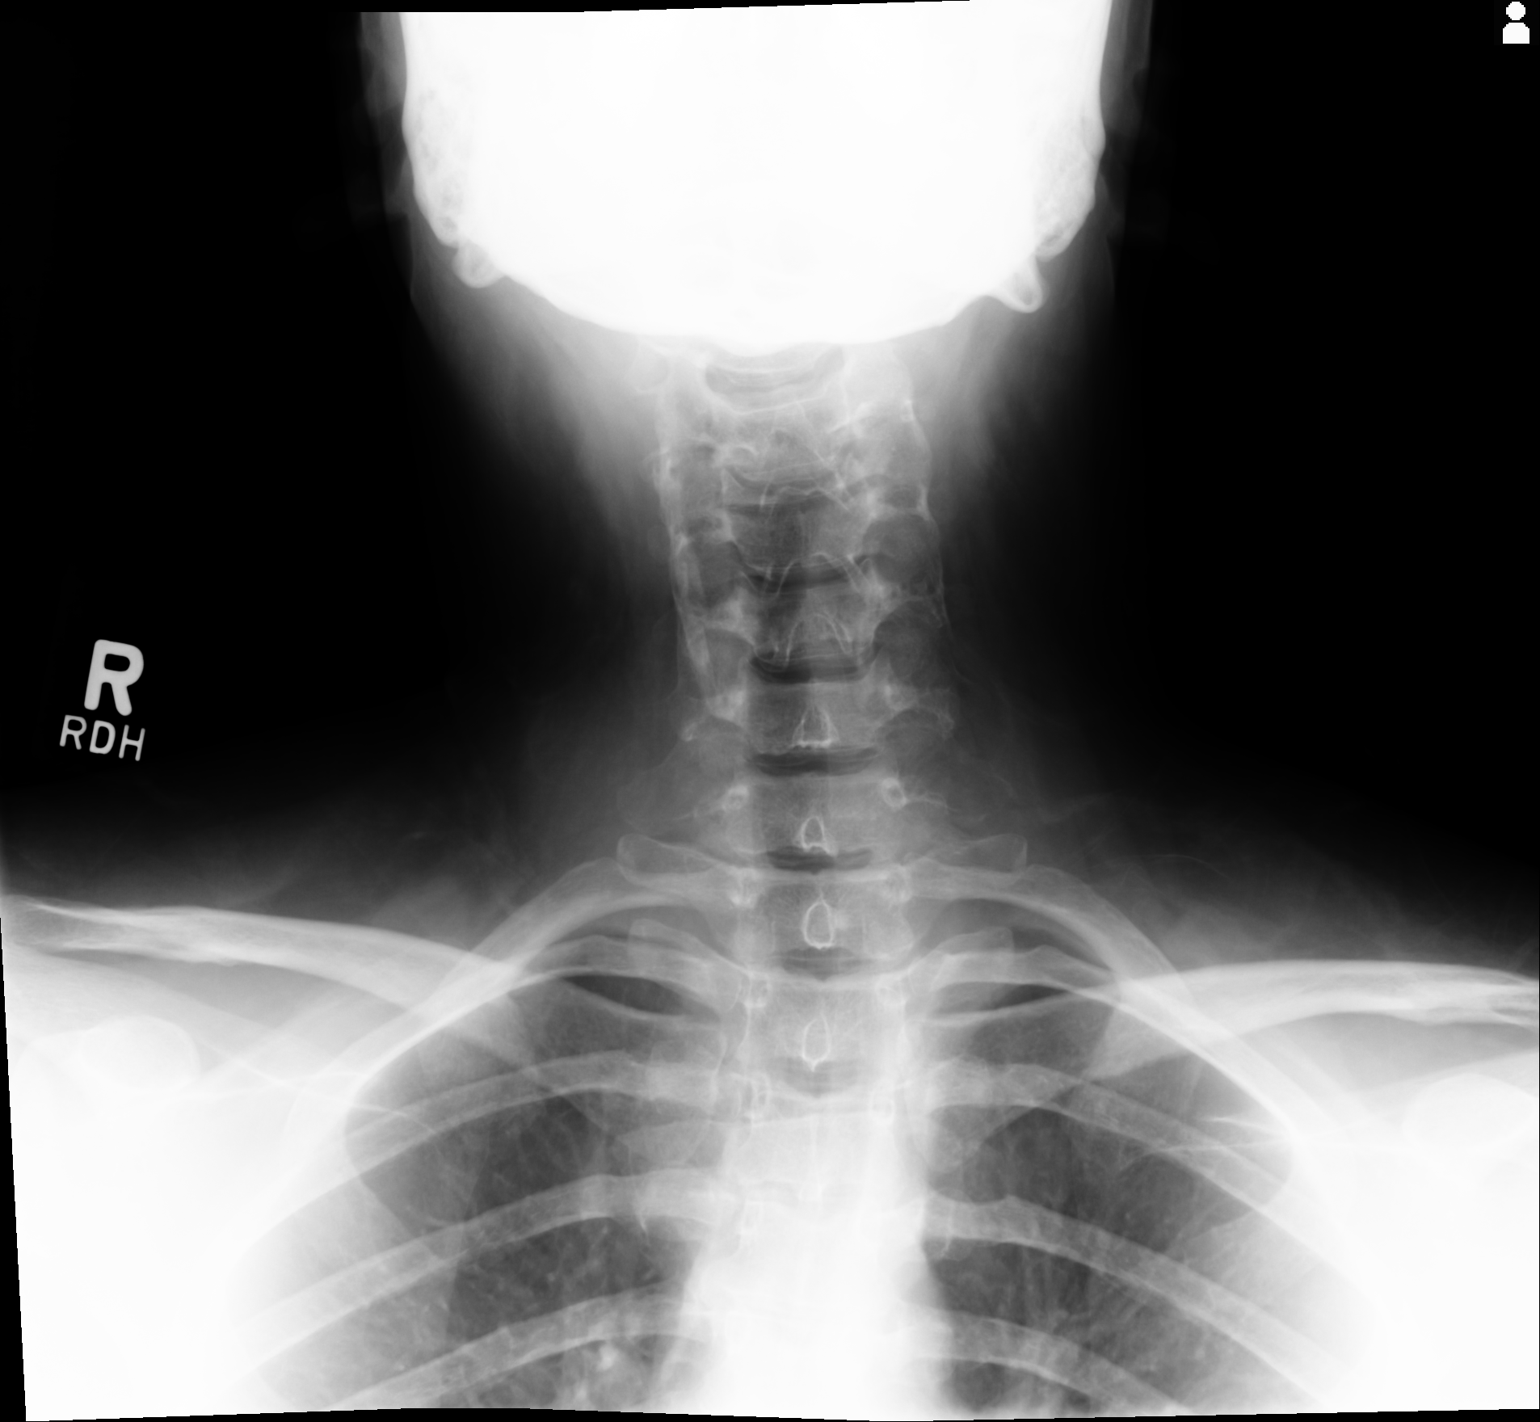
[im 2/2]
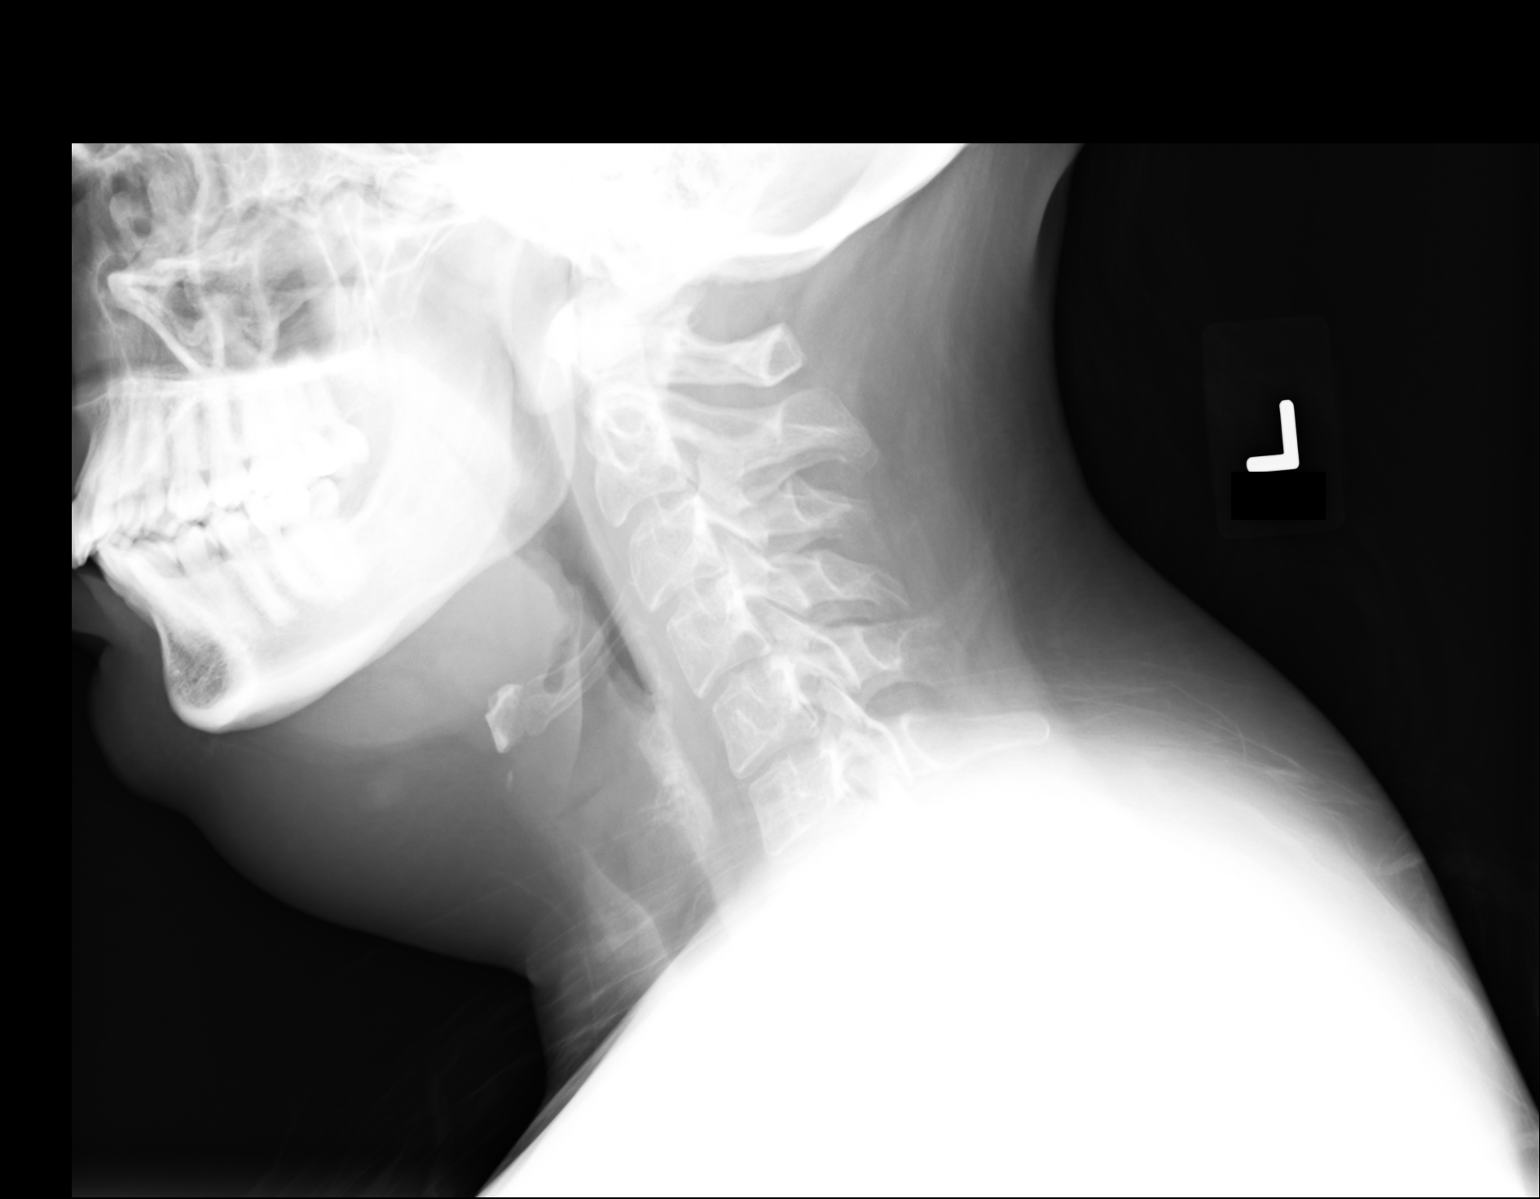

[2 of 2 positions shown; findings below may reference images not displayed]

IMPRESSION: No foreign body is demonstrated in the cervical airway or
proximal esophagus.

[REDACTED]

## 2011-10-31 IMAGING — CR DG ABDOMEN 2V
1 series · 3 of 3 positions shown · non-contrast
Comparison: none

REASON FOR EXAM: foreign body progression following bowel prep
COMMENTS:

[Series 1: w abdomen upright · 0.14mm/px · 3 of 3 slices shown]
[im 1/3]
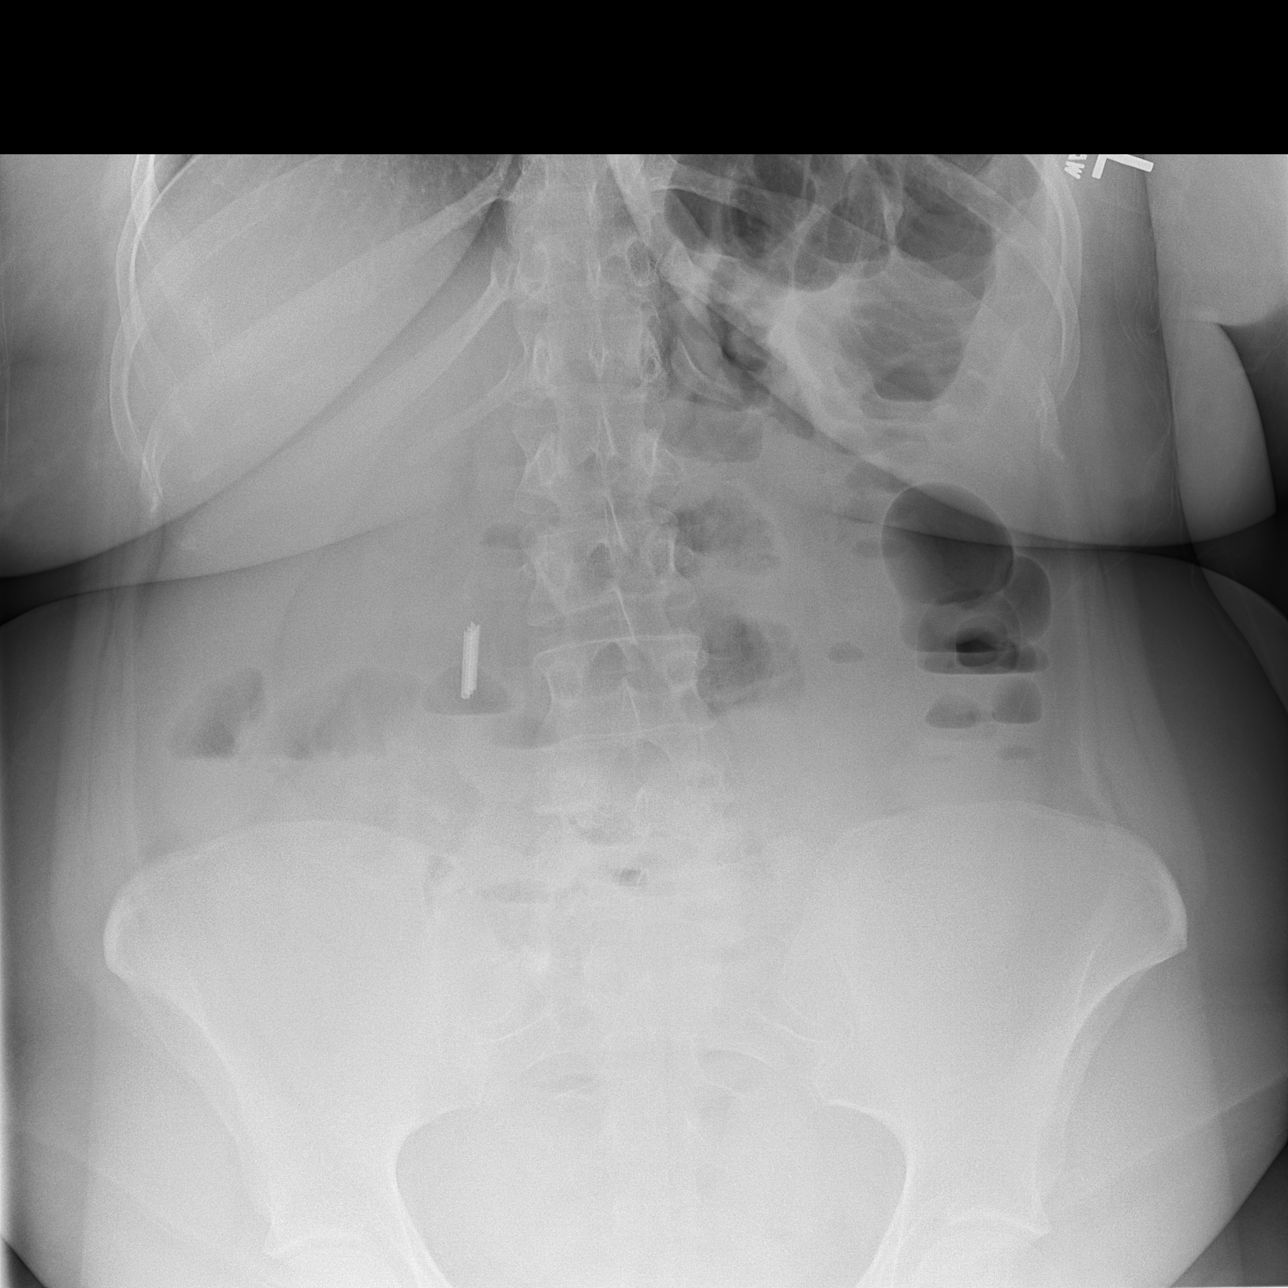
[im 2/3]
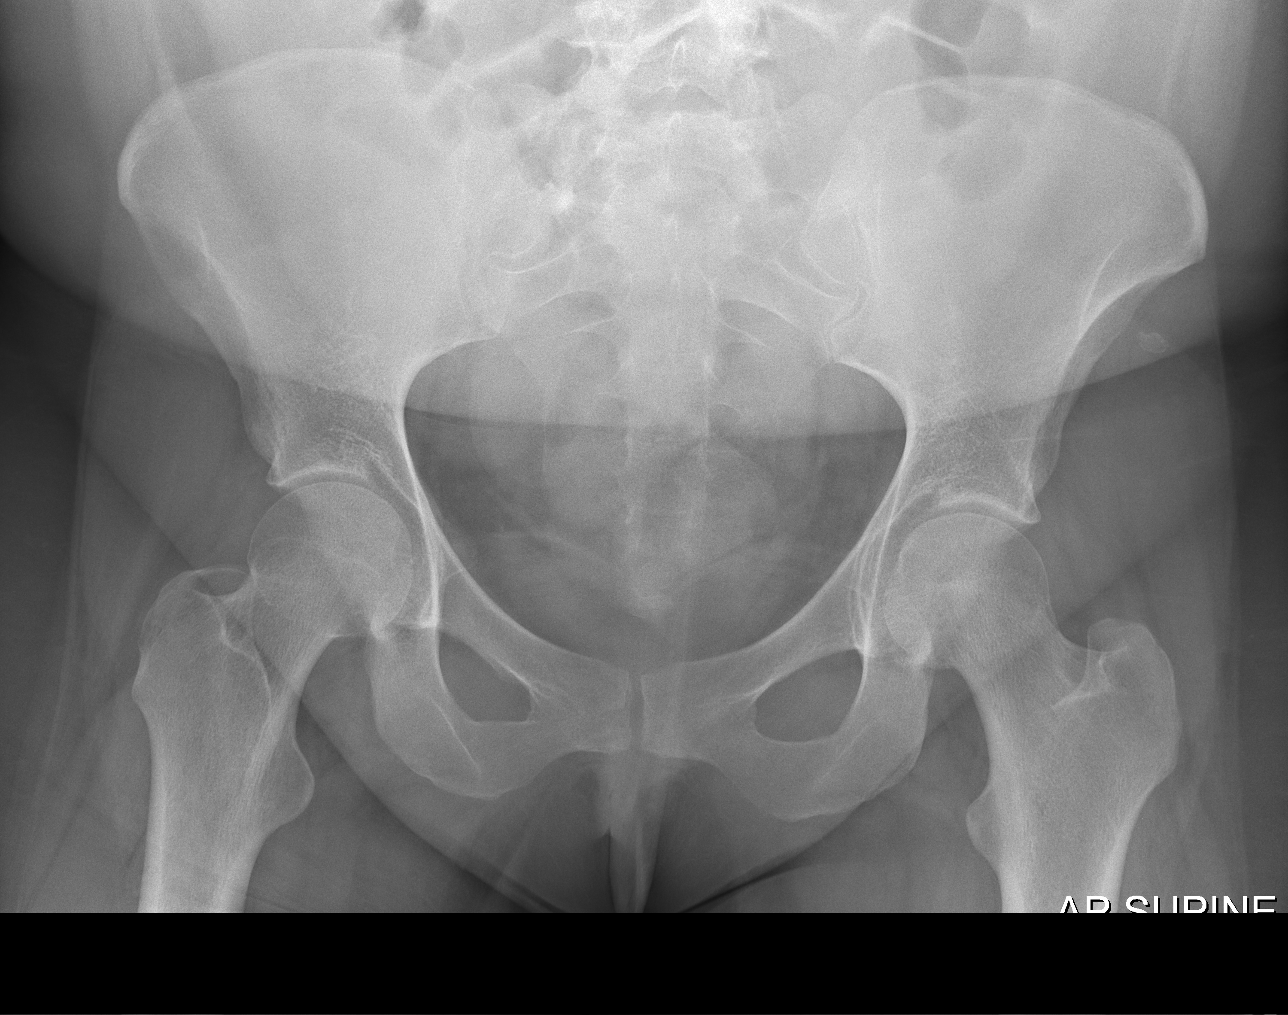
[im 3/3]
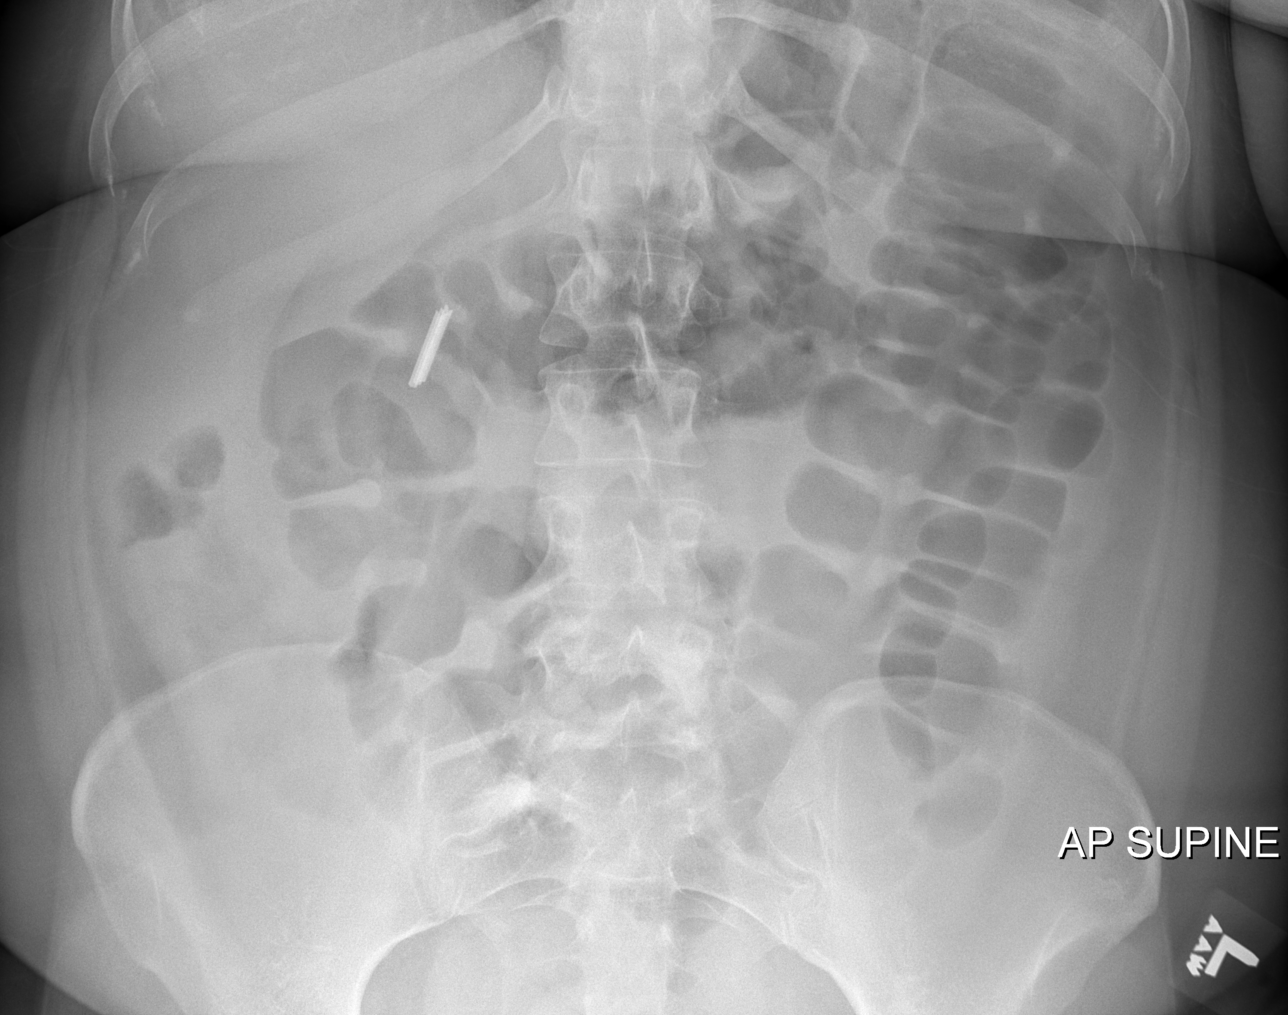

[3 of 3 positions shown; findings below may reference images not displayed]

PROCEDURE:     DXR - DXR ABDOMEN 2 V FLAT AND ERECT  - [DATE]  [DATE]

RESULT:     The previously noted metallic density compatible with a razor
blade is visualized in the right upper quadrant. The location of this
foreign body on this exam is indeterminate since this area overlies the
region of the hepatic flexure and of the proximal duodenum. No
subdiaphragmatic free air in is seen. In the erect view, there are a few
scattered fluid levels noted in the colon which may be due to the bowel prep
mentioned in the clinical history.
IMPRESSION: The previously noted foreign body is projected over the right upper
quadrant. The finding likely is in the duodenum but the location cannot be
determined with certainty since colon also overlies this area.

[REDACTED]

## 2011-11-01 ENCOUNTER — Inpatient Hospital Stay: Payer: Self-pay | Admitting: Psychiatry

## 2011-11-01 IMAGING — CR DG ABDOMEN 2V
1 series · 3 of 3 positions shown · non-contrast
Comparison: none

REASON FOR EXAM: f/u FB
COMMENTS:

[Series 1: erect ap · 0.17mm/px · 3 of 3 slices shown]
[im 1/3]
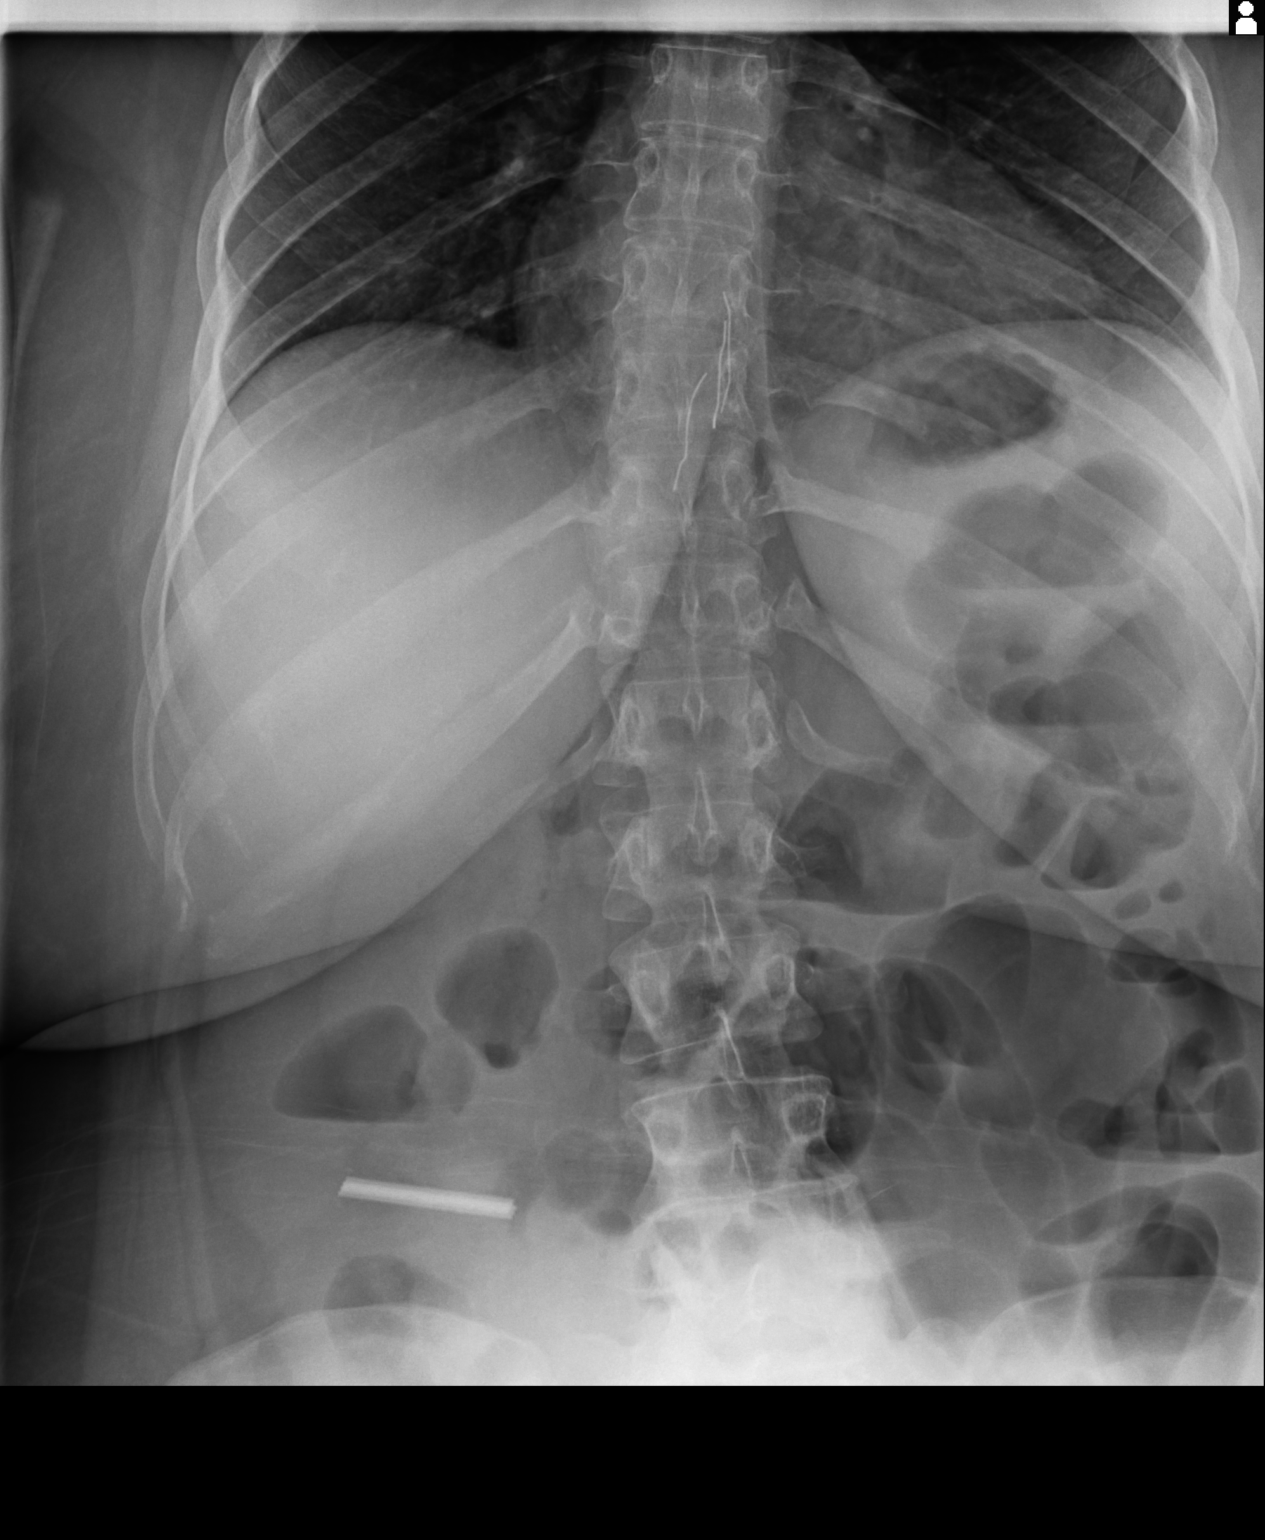
[im 2/3]
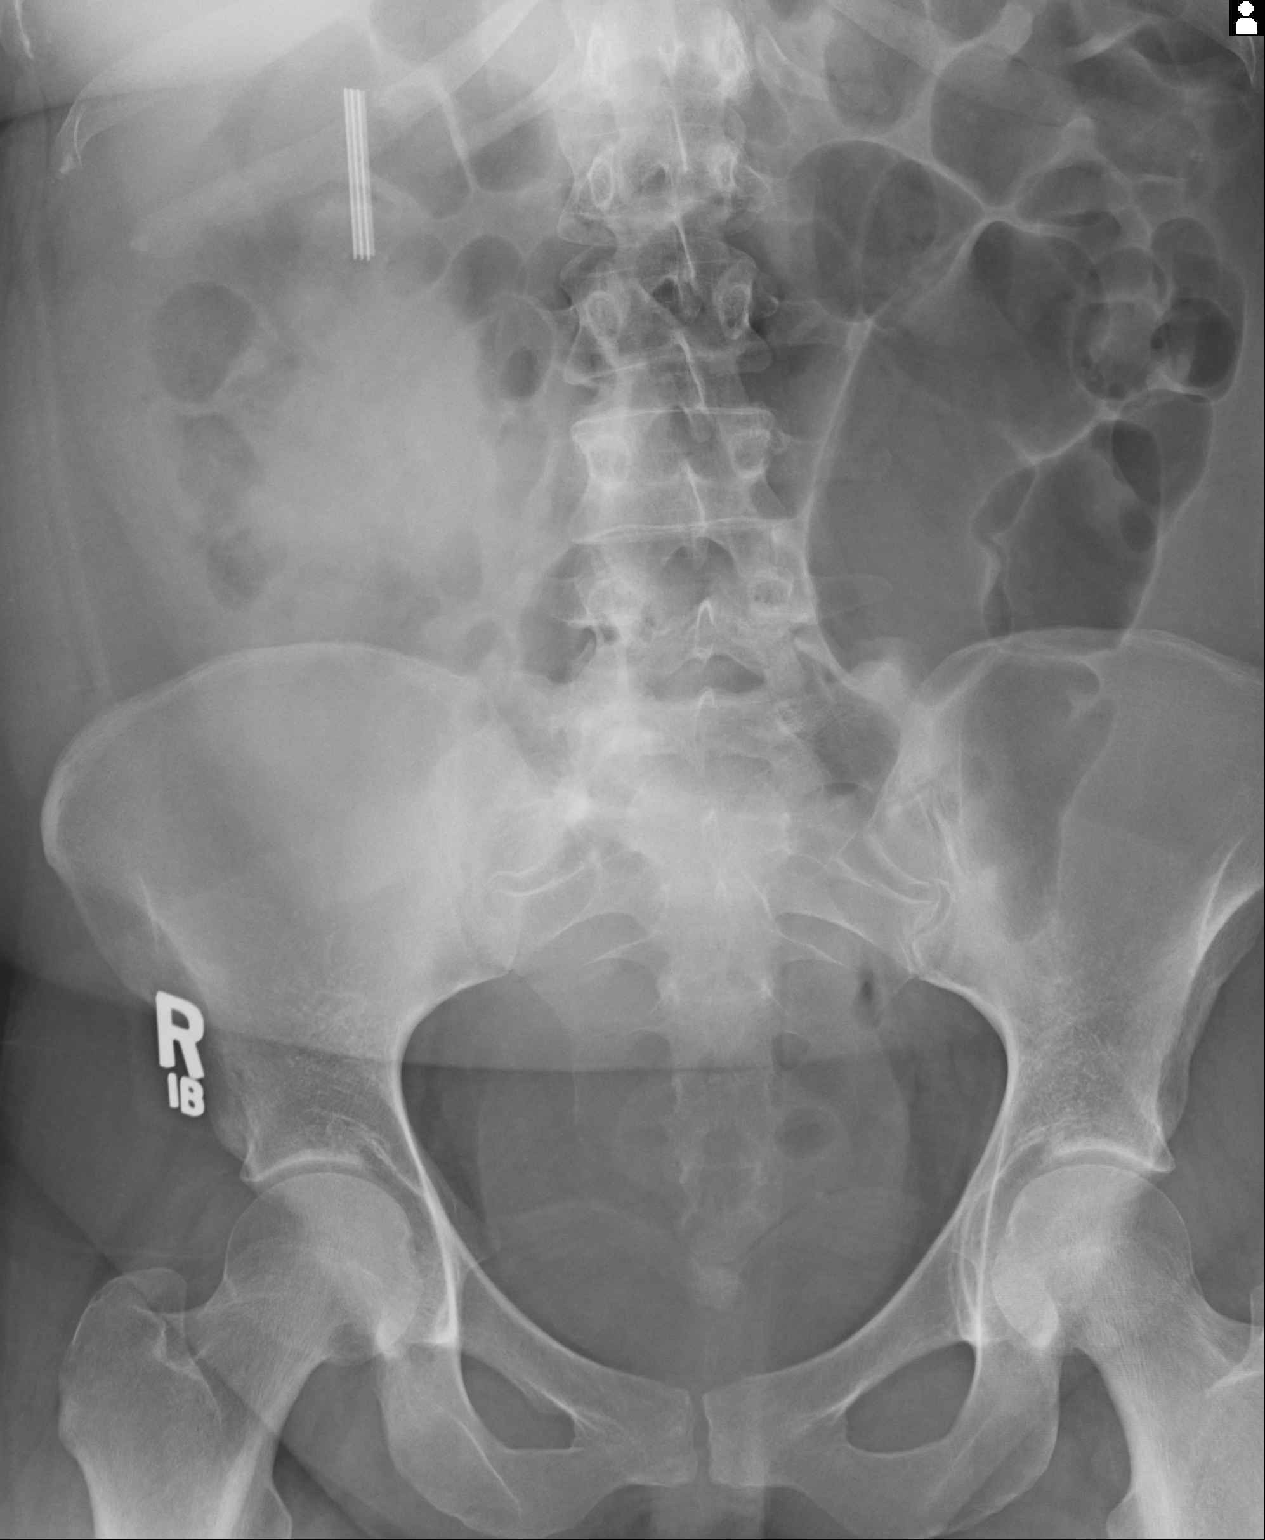
[im 3/3]
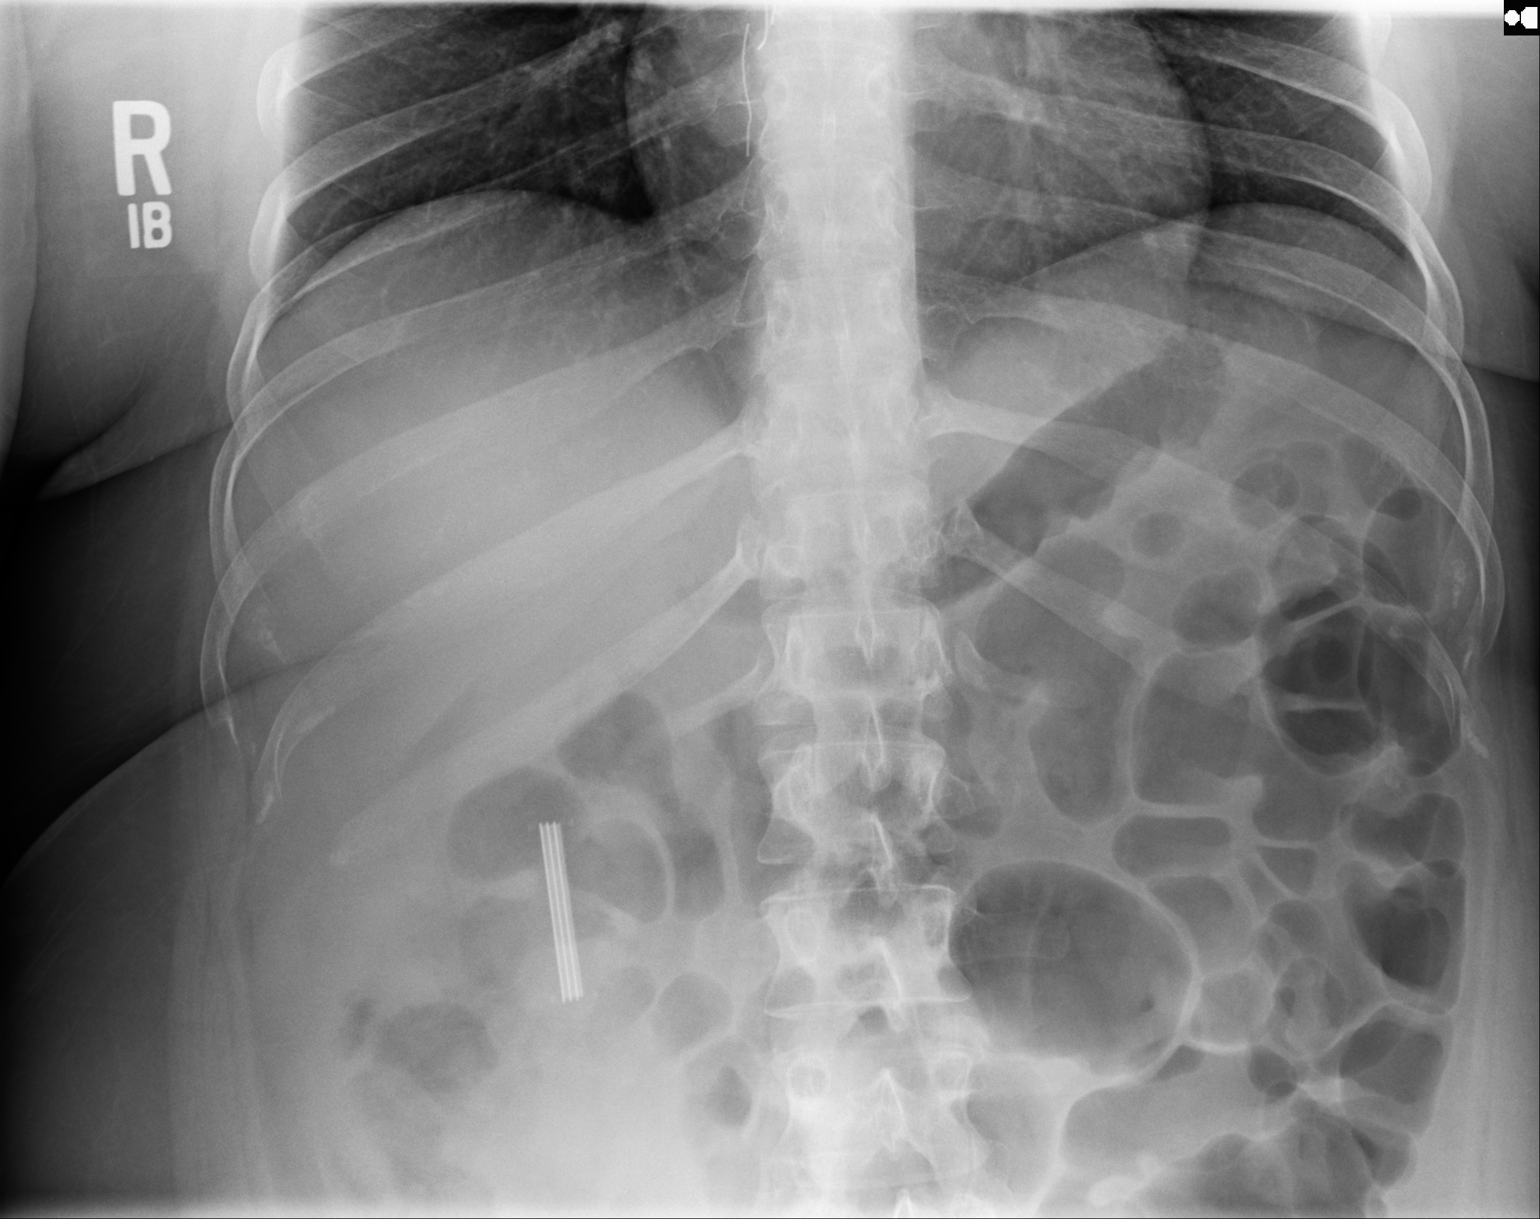

[3 of 3 positions shown; findings below may reference images not displayed]

PROCEDURE:     DXR - DXR ABDOMEN 2 V FLAT AND ERECT  - [DATE]  [DATE]

RESULT:     Flat and erect views of the abdomen were obtained. No
subdiaphragmatic free air is seen. There is a metallic density compatible
with the ingested razor blade mentioned in the clinical history. The density
is projected over the right upper quadrant and appears to be in small bowel
or more likely colon. The bowel gas pattern is normal. No acute bony
abnormalities are seen.
IMPRESSION: 1.     The previously noted ingested foreign body is projected over the
right abdomen and now appears to be in small bowel or colon.

[REDACTED]

## 2011-11-02 IMAGING — CR DG ABDOMEN 3V
1 series · 4 of 4 positions shown · non-contrast
Comparison: none

REASON FOR EXAM: f/u FB after more Golyt[place]
COMMENTS:

[Series 1: erect ap · 0.17mm/px · 4 of 4 slices shown]
[im 1/4]
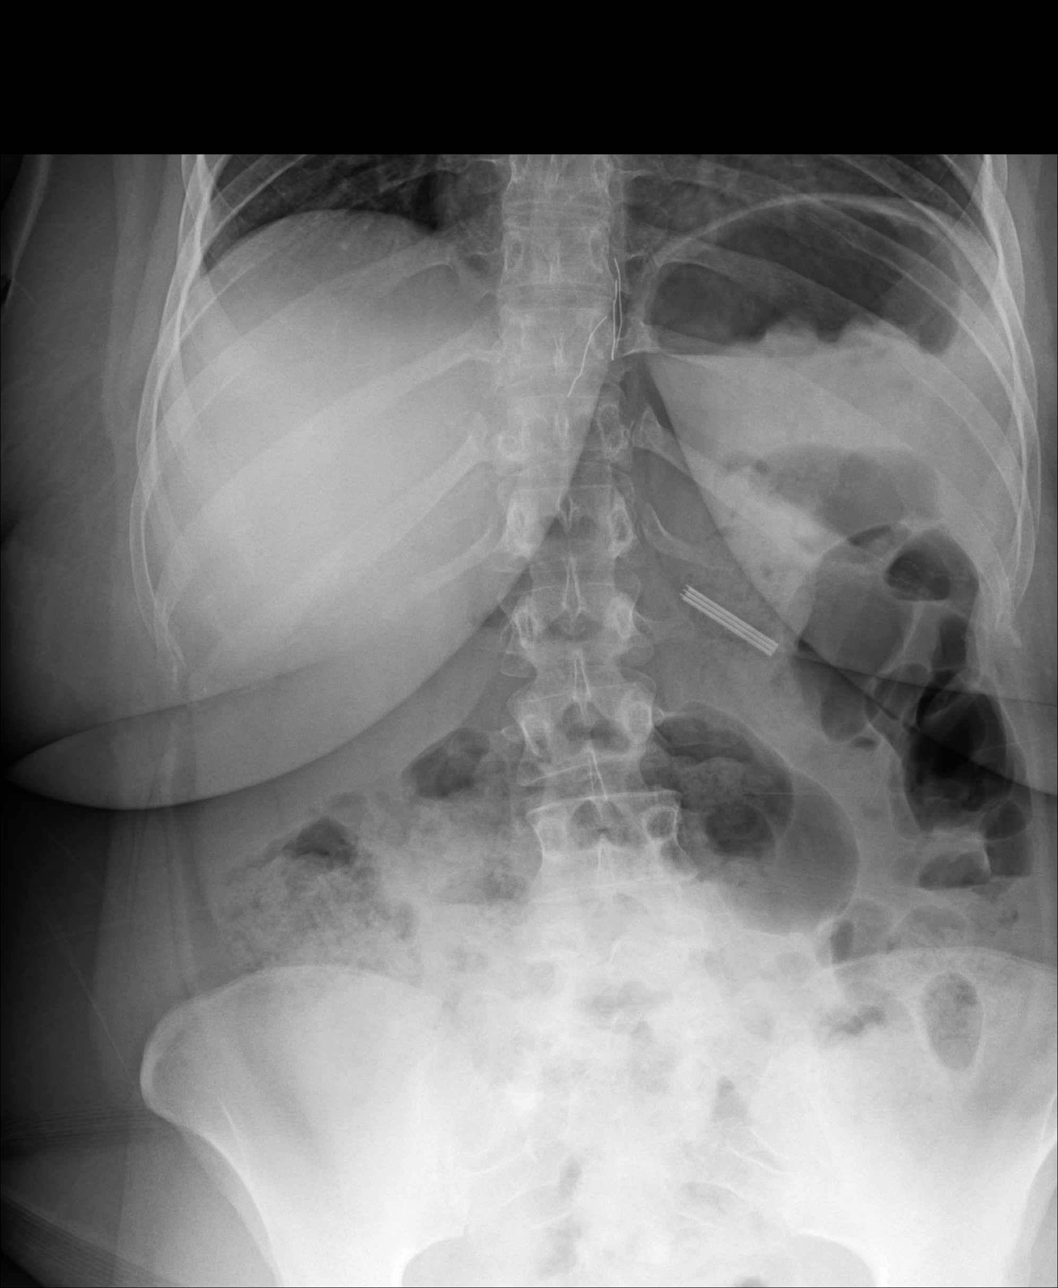
[im 2/4]
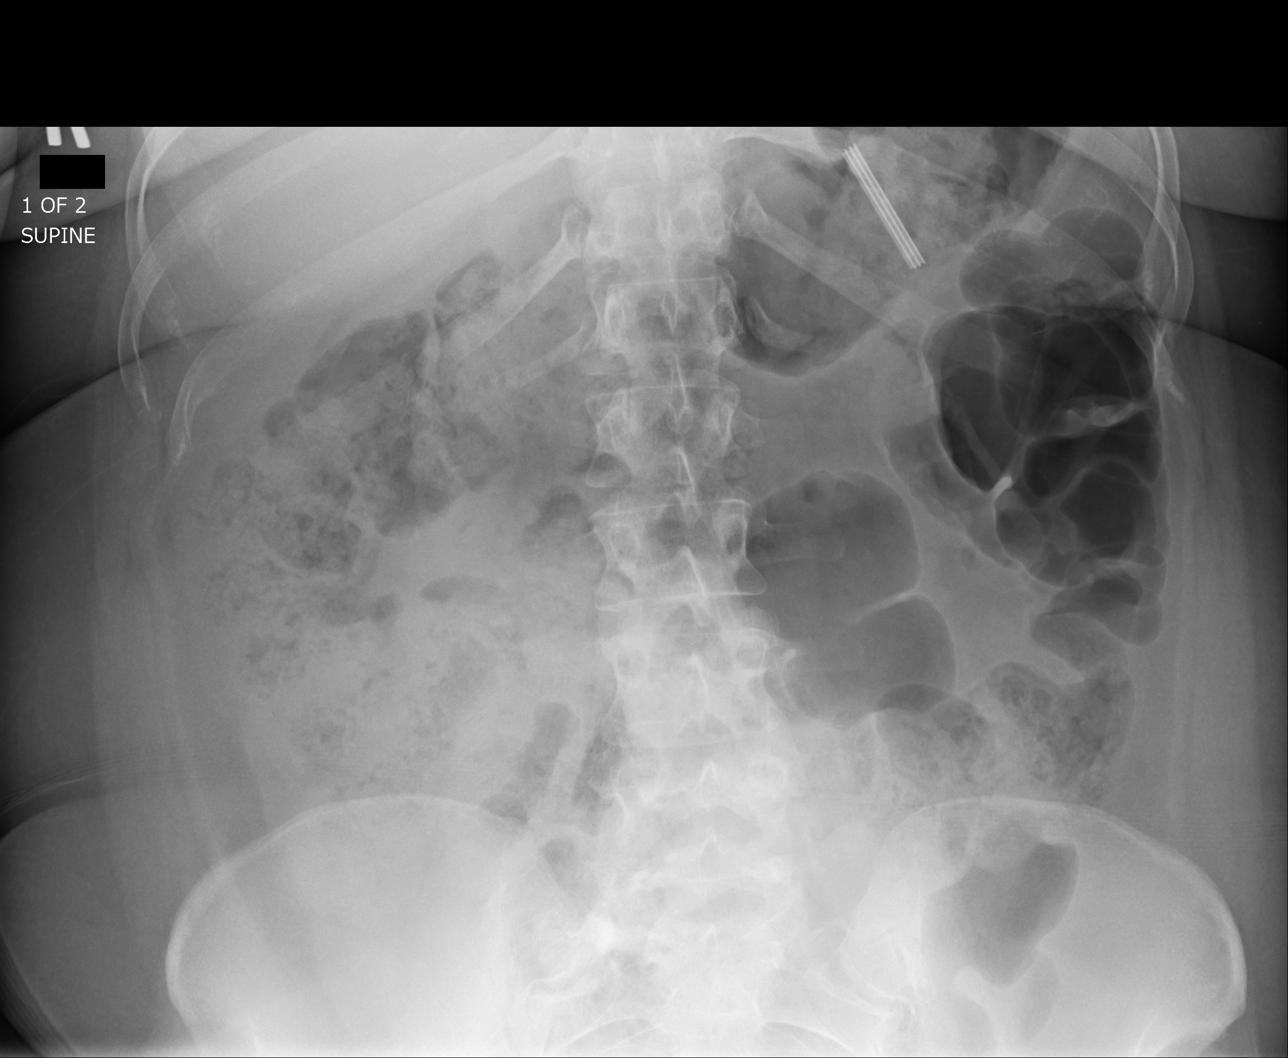
[im 3/4]
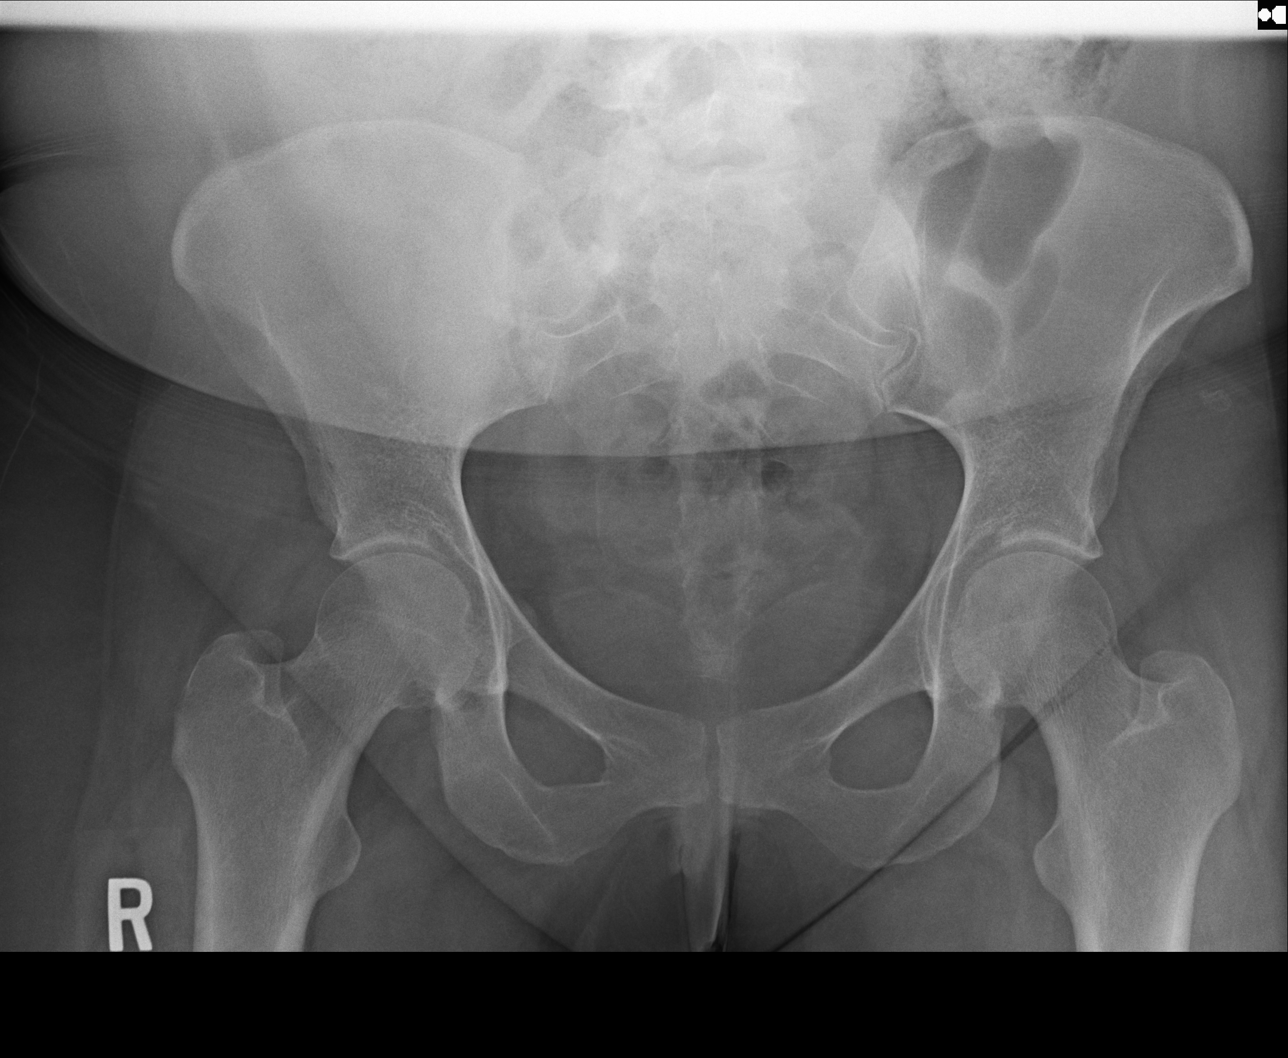
[im 4/4]
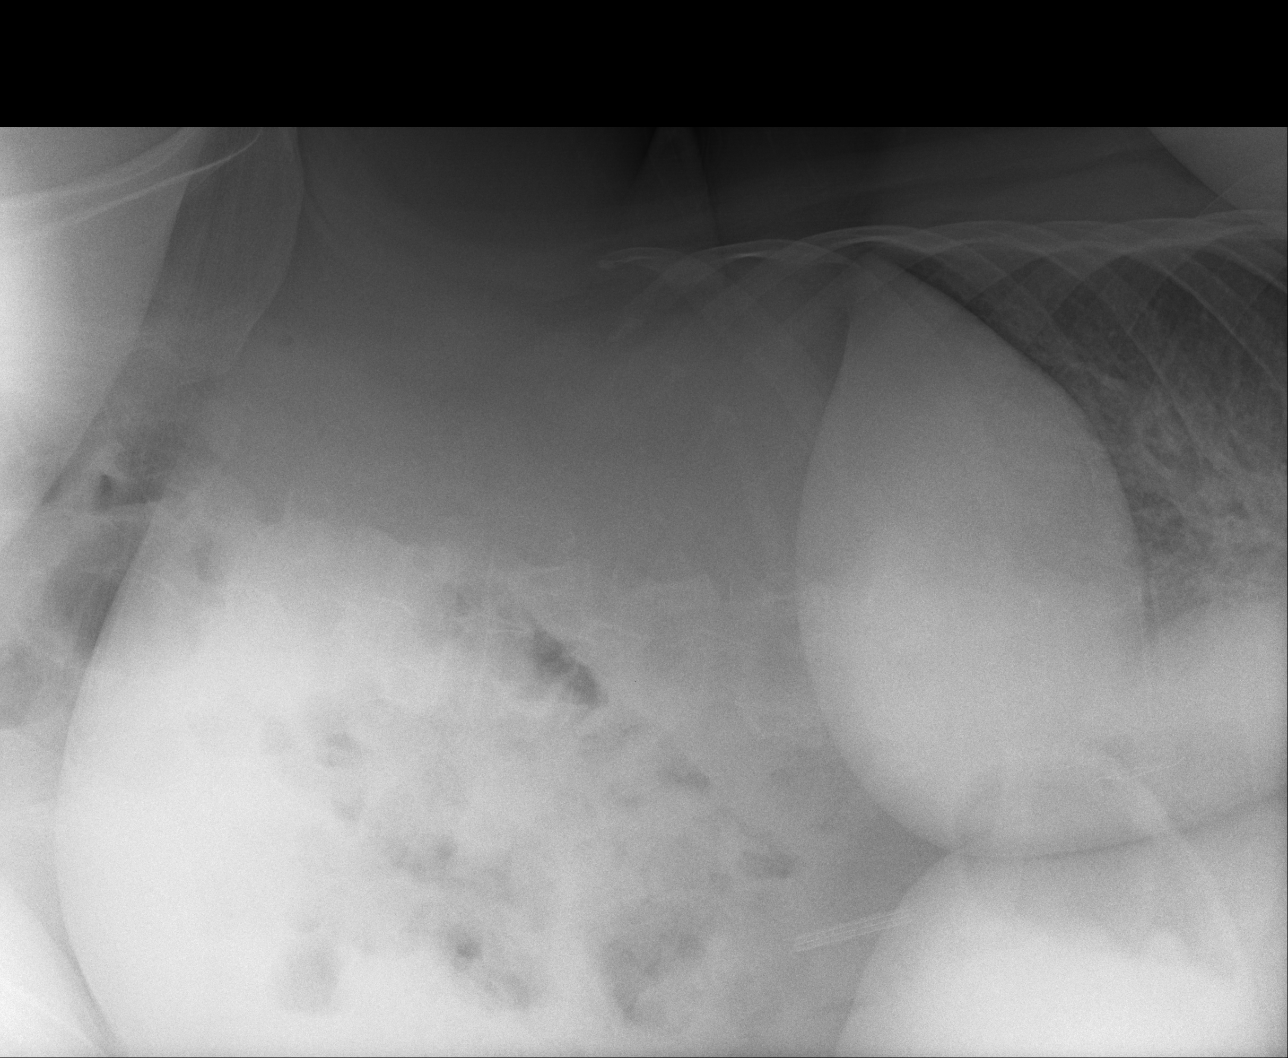

[4 of 4 positions shown; findings below may reference images not displayed]

PROCEDURE:     DXR - DXR ABDOMEN COMPLETE  - [DATE]  [DATE]

RESULT:     Comparison is made to the prior exam of [DATE]. The
previously noted metallic foreign body is projected over the left upper
quadrant. The distal transverse colon and the stomach overlap in this region
and from the basis of this exam alone the location of the foreign body is
unclear. No subdiaphragmatic free air is seen. No acute bony abnormalities
are identified.
IMPRESSION: 1. No subdiaphragmatic free air is seen.
2. The previously noted foreign body in the GI tract is now located in the
left upper quadrant.

[REDACTED]

## 2011-11-04 IMAGING — CR DG ABDOMEN 2V
1 series · 3 of 3 positions shown · non-contrast
Comparison: none

REASON FOR EXAM: f/u FB
COMMENTS:

[Series 4: w abdomen upright · 0.14mm/px · 3 of 3 slices shown]
[im 1/3]
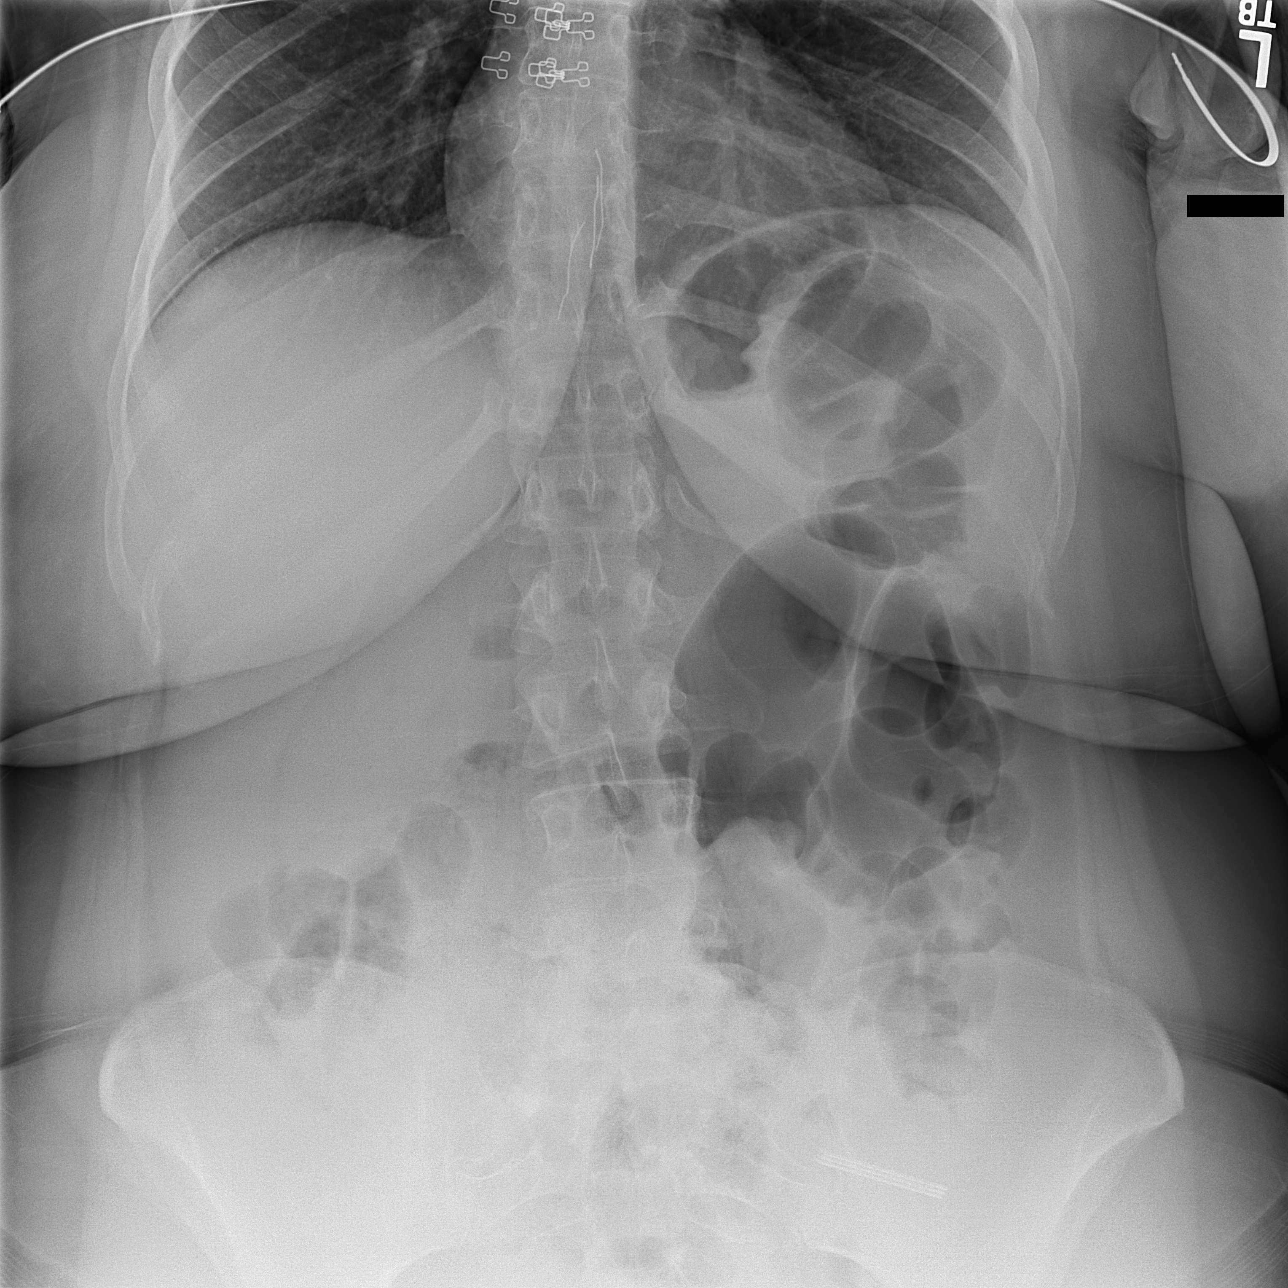
[im 2/3]
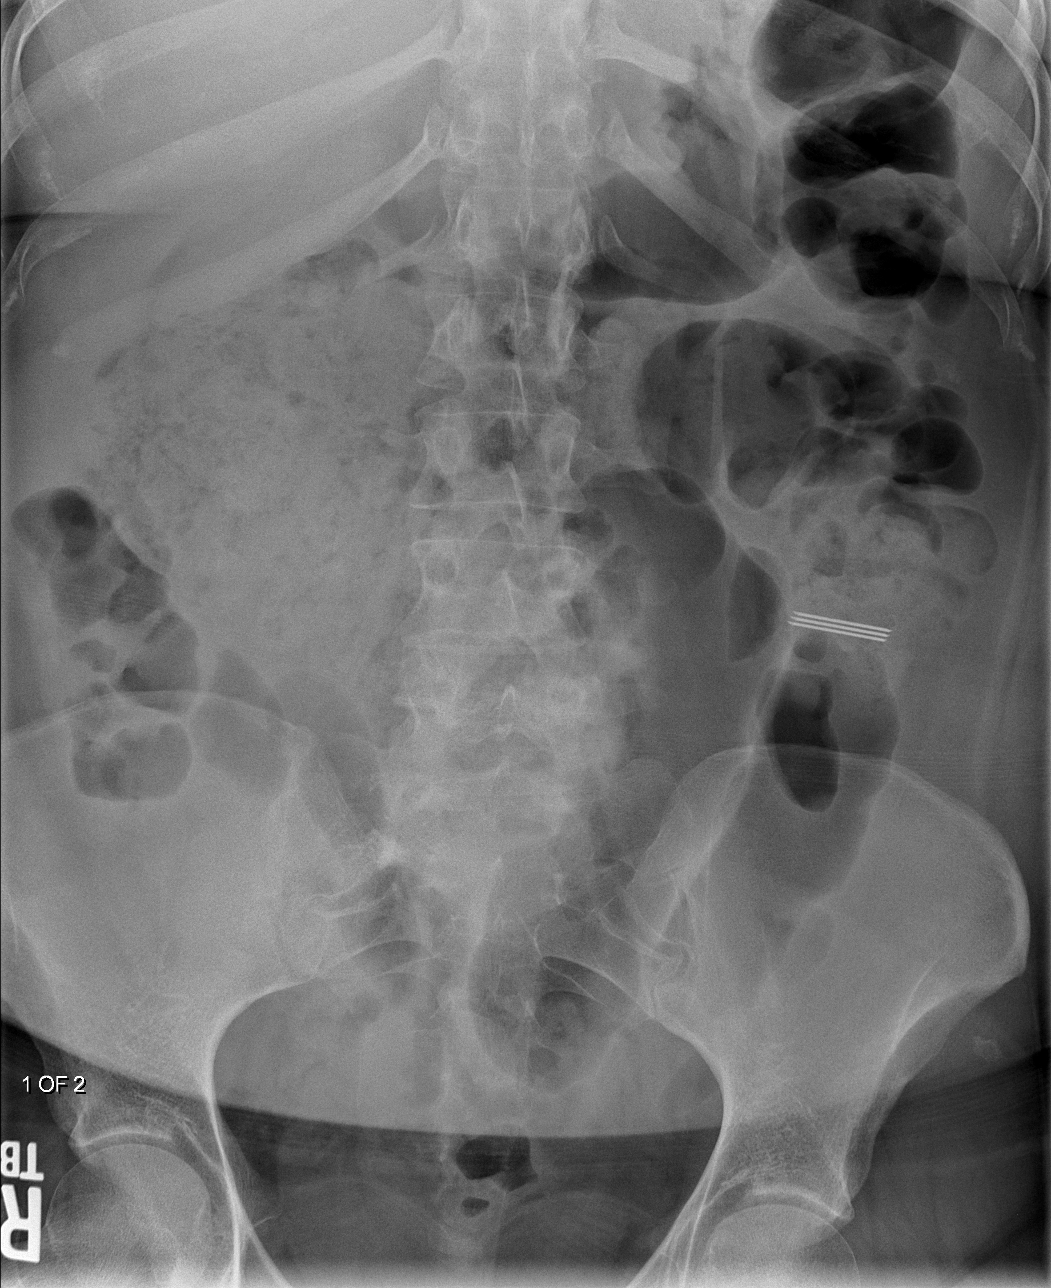
[im 3/3]
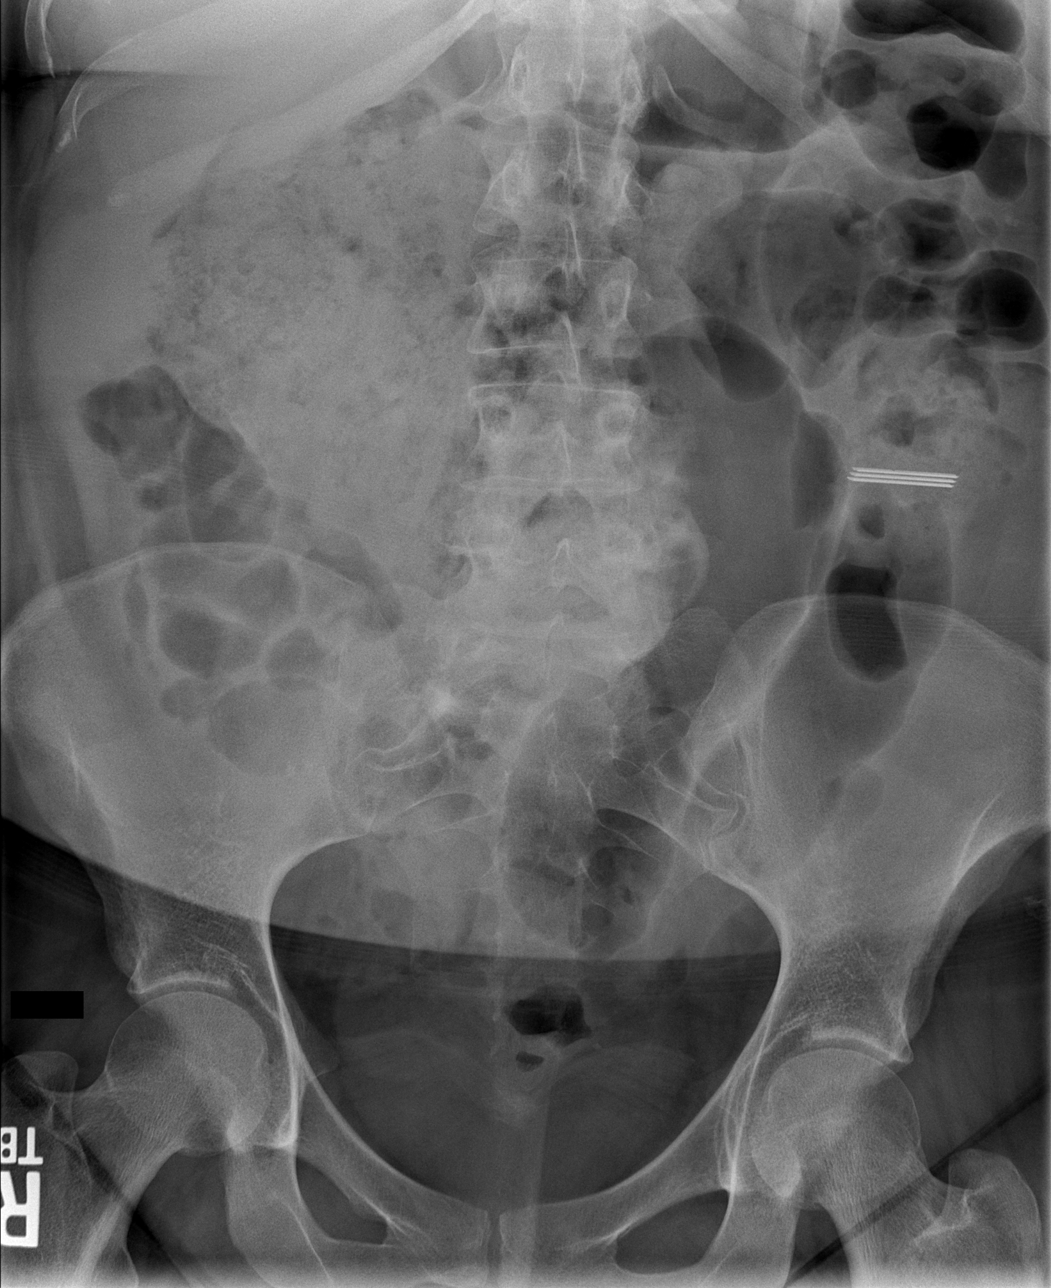

[3 of 3 positions shown; findings below may reference images not displayed]

PROCEDURE:     DXR - DXR ABDOMEN 2 V FLAT AND ERECT  - [DATE]  [DATE]

RESULT:     Comparison is made to the prior exam of [DATE]. The
previously noted foreign body is again seen and is now projected over the
mid descending colon. There is a moderate amount of fecal material in the
colon. No dilated bowel loops are seen.

Incidental note is made of a few linear metallic densities projected over
the lower mediastinum at approximately the level of the T9 and T10 vertebral
bodies. These have been present on multiple prior examinations and can be
seen on prior chest radiographs to be located in the soft tissues anterior
to the chest.
IMPRESSION: 1. The previously noted ingested foreign body is now projected over the left
abdomen at the level of the mid descending colon.
2. No subdiaphragmatic free air or other acute change is identified.

[REDACTED]

## 2011-11-05 IMAGING — CR DG ABDOMEN 1V
1 series · 2 of 2 positions shown · non-contrast
Comparison: none

REASON FOR EXAM: f/u FB lt colon
COMMENTS:

PROCEDURE:     DXR - DXR ABDOMEN AP ONLY  - [DATE]  [DATE]
RESULT:     To images of the abdomen demonstrate linear densities projecting
in the left upper quadrant the left midabdomen possibly within the proximal
descending colon or splenic flexure region. No new foreign body is evident.

[Series 1: ap · 0.17mm/px · 2 of 2 slices shown]
[im 1/2]
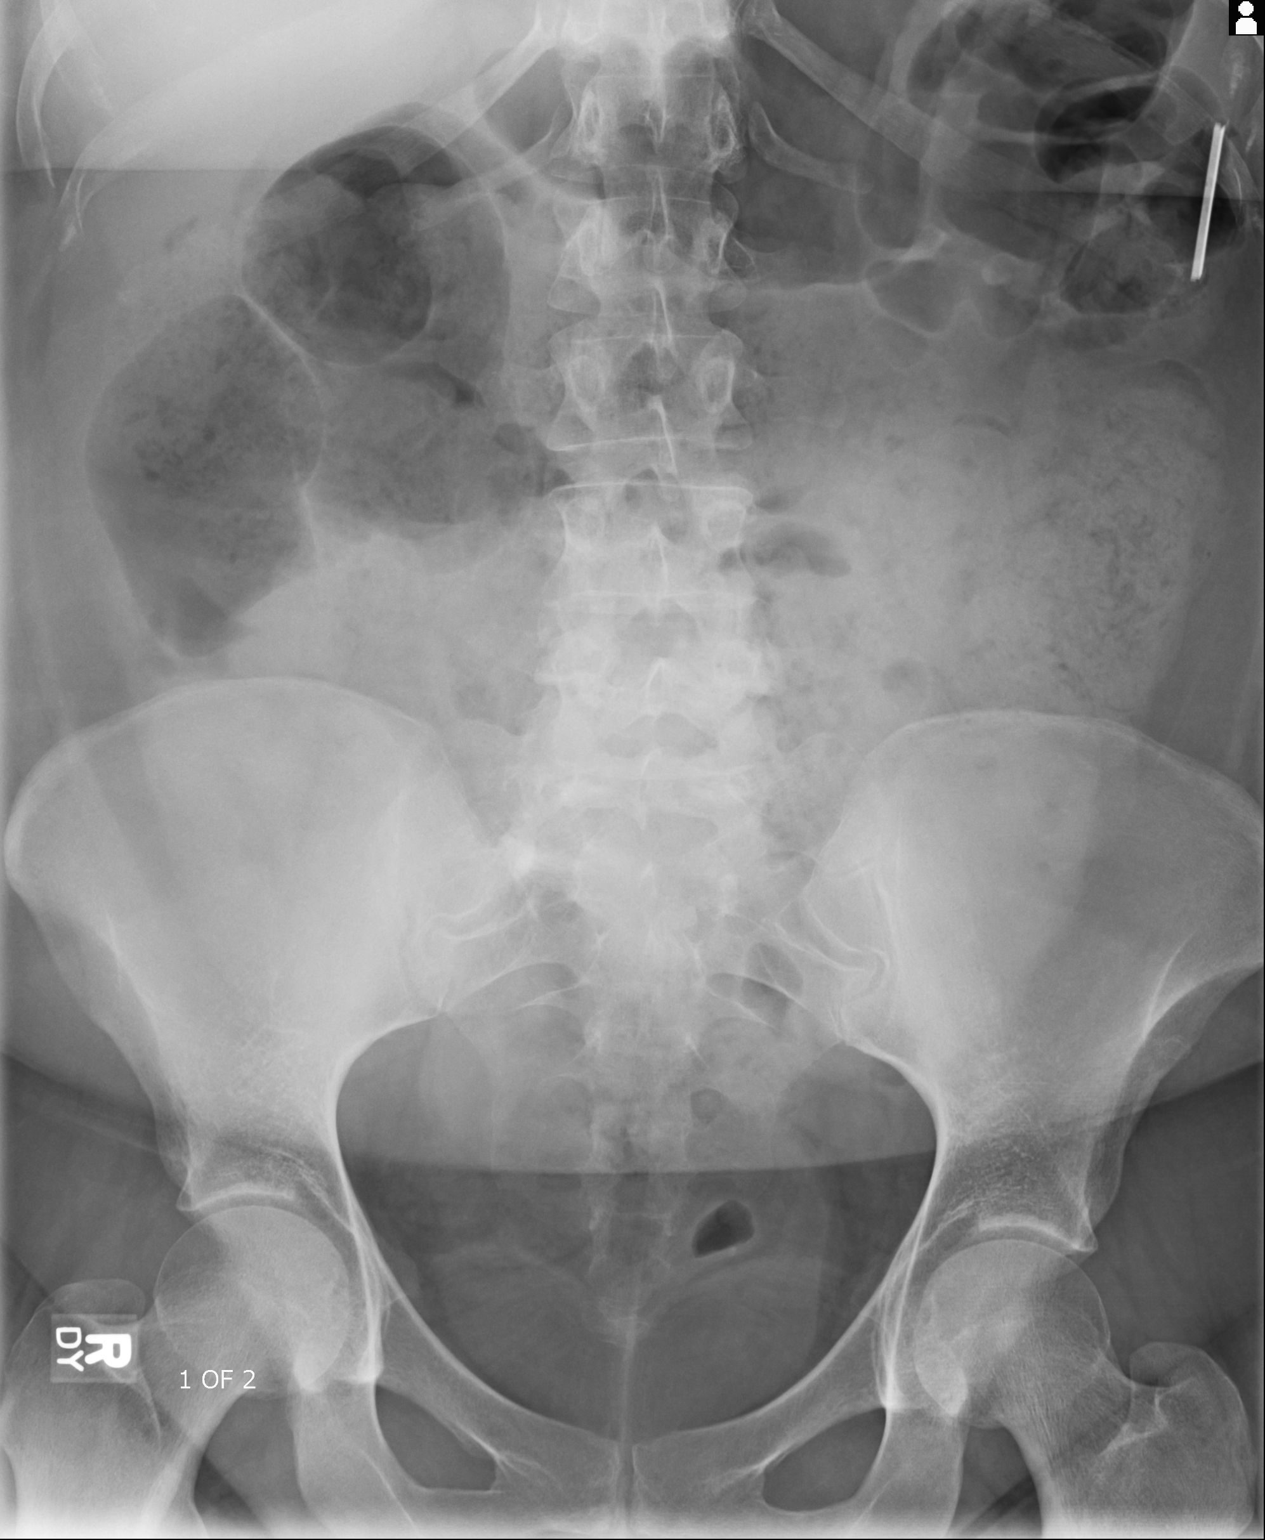
[im 2/2]
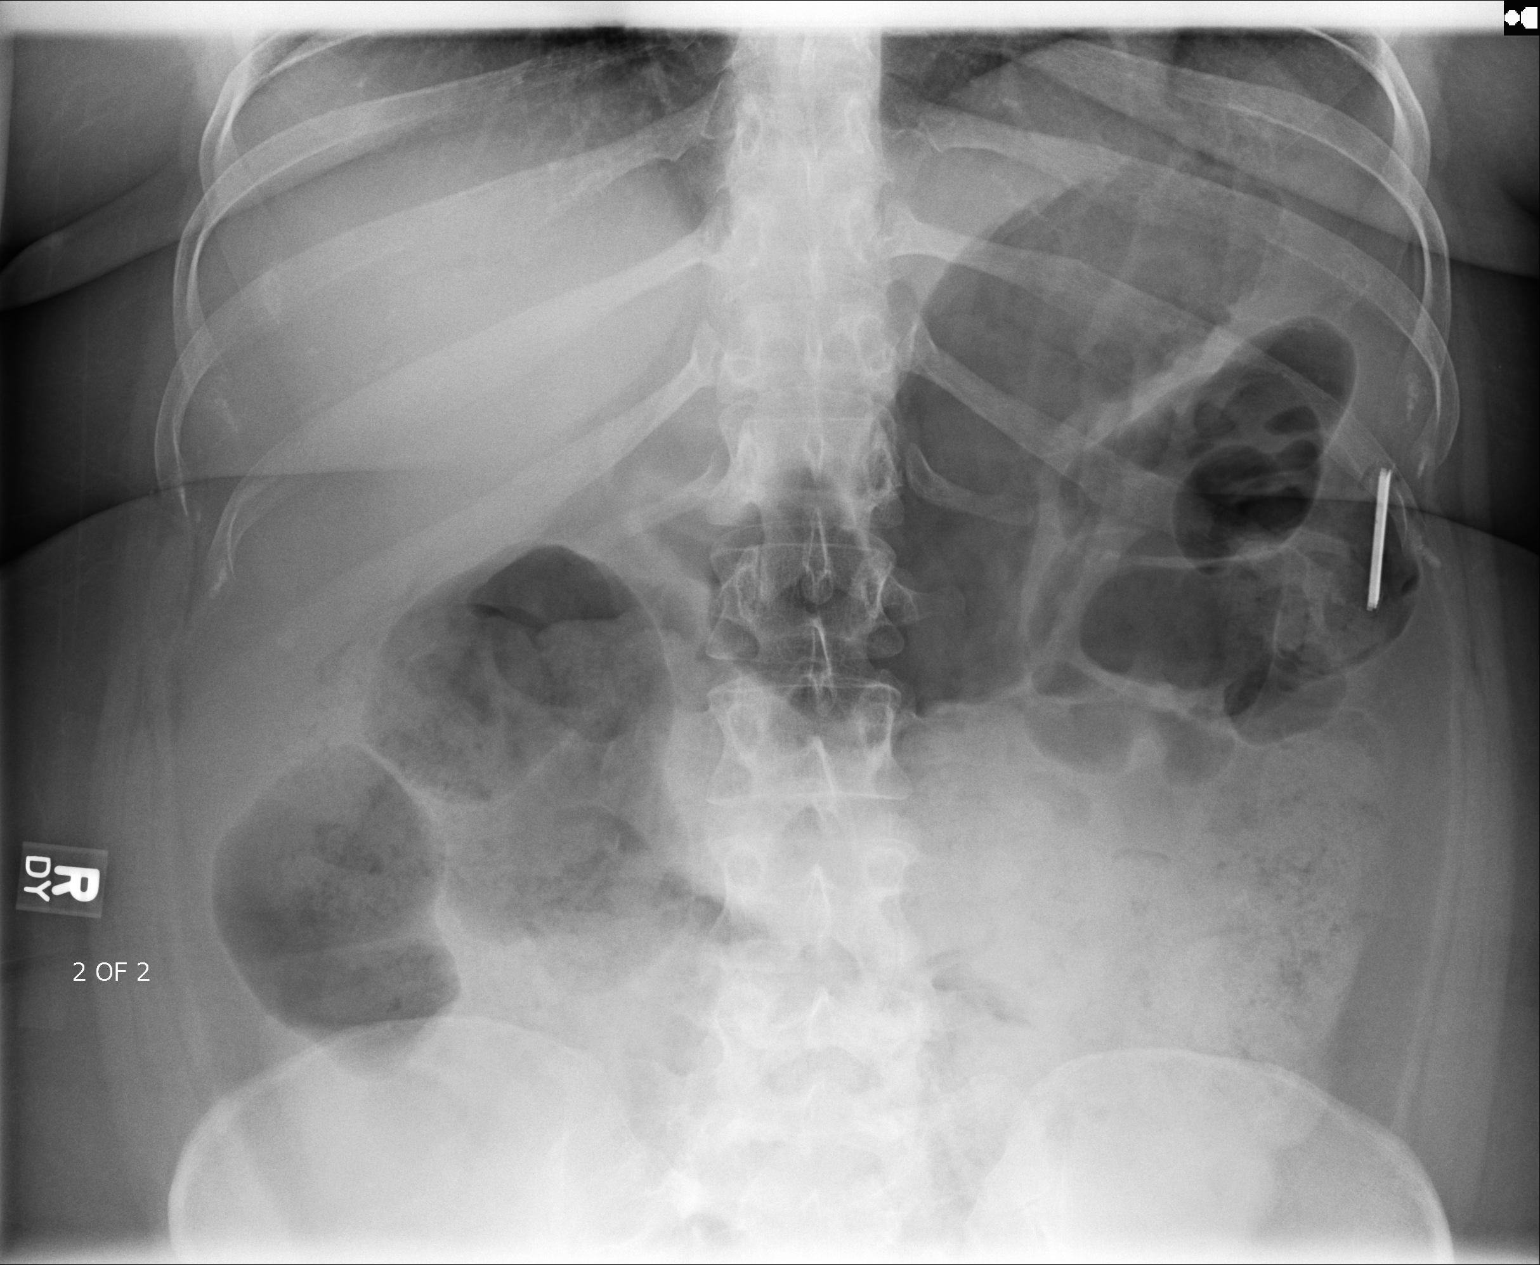

[2 of 2 positions shown; findings below may reference images not displayed]

IMPRESSION: Please see above.

[REDACTED]

## 2011-11-06 IMAGING — CR DG ABDOMEN 1V
1 series · 1 of 1 positions shown · non-contrast
Comparison: none

REASON FOR EXAM: f/u FB
COMMENTS:

PROCEDURE:     DXR - DXR KIDNEY URETER BLADDER  - [DATE]  [DATE]
RESULT:     Three linear metallic densities project just above the level of
the left iliac crest in the region of the descending colon. There is a
moderate amount of fecal material present.

[t abdomen supine]
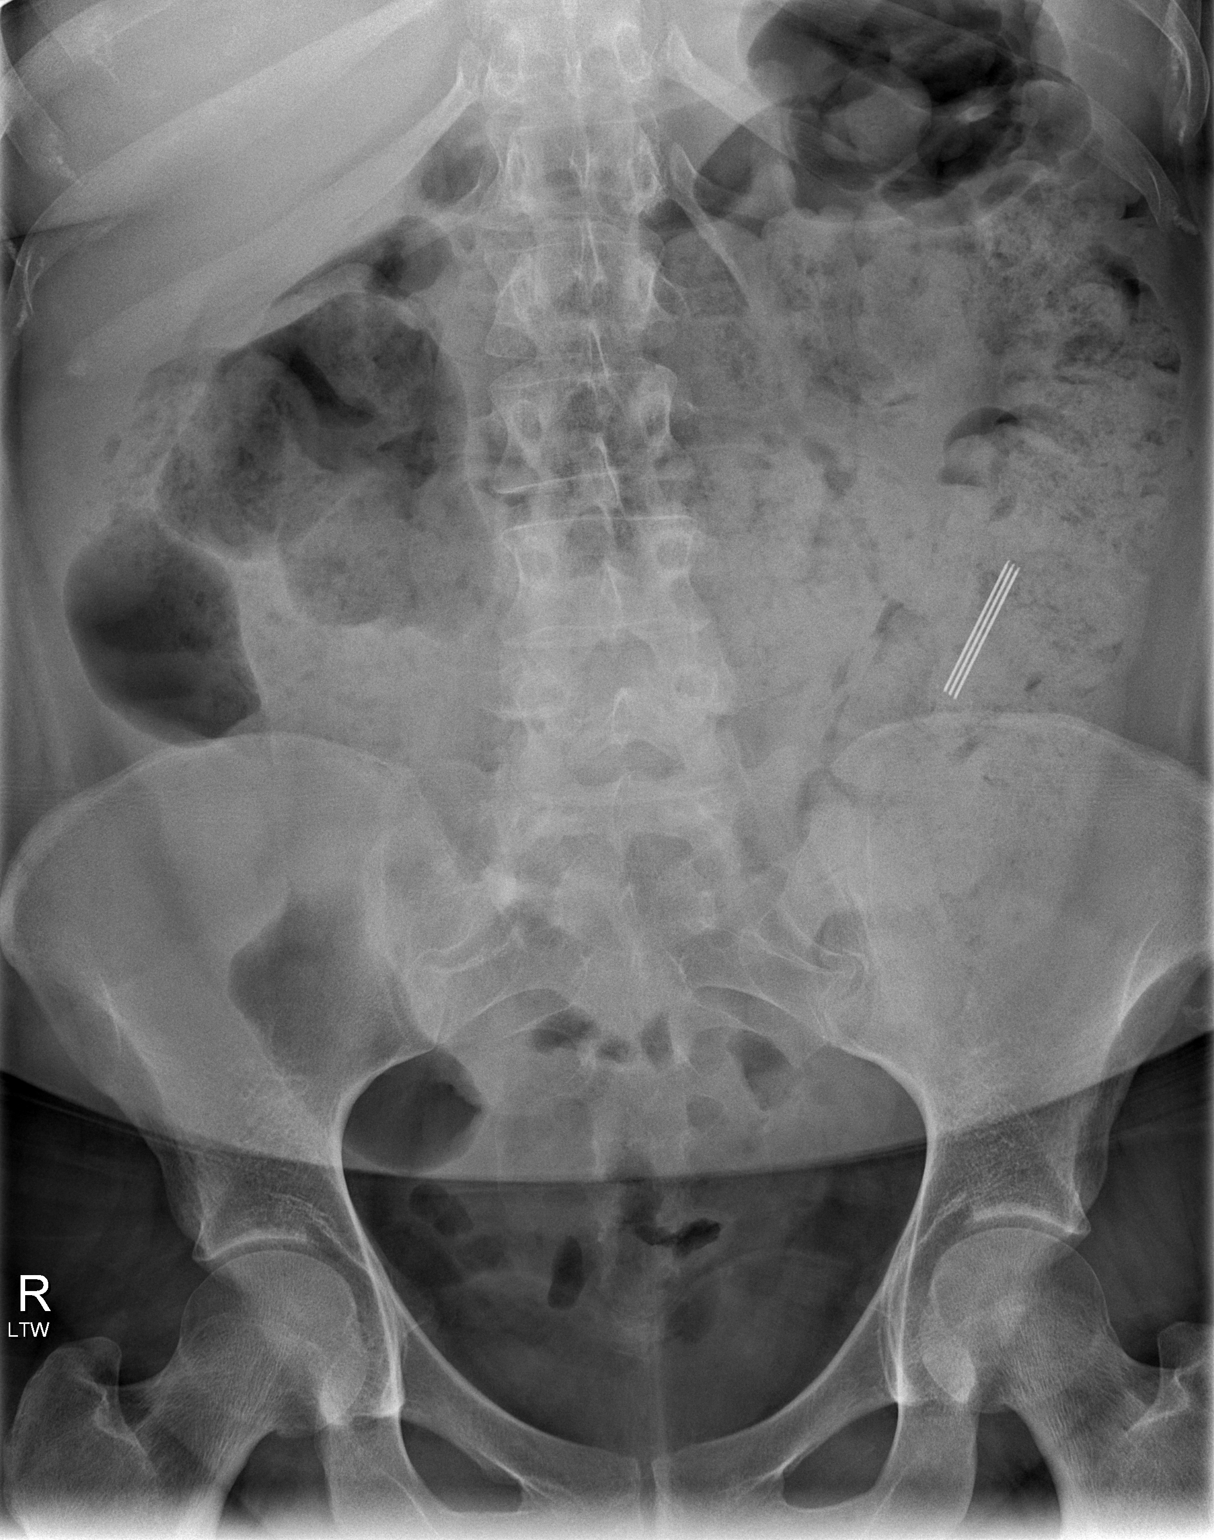

[1 of 1 positions shown; findings below may reference images not displayed]

IMPRESSION: 1. Foreign bodies in the descending colon.

[REDACTED]

## 2011-11-07 IMAGING — CR DG ABDOMEN 1V
1 series · 2 of 2 positions shown · non-contrast
Comparison: none

REASON FOR EXAM: FB colon
COMMENTS:

PROCEDURE:     DXR - DXR KIDNEY URETER BLADDER  - [DATE]  [DATE]
RESULT:     Soft tissue structures are unremarkable. The gas pattern is
nonspecific. Metallic densities are noted over the left abdomen.

[Series 8: t abdomen supine · 0.14mm/px · 2 of 2 slices shown]
[im 1/2]
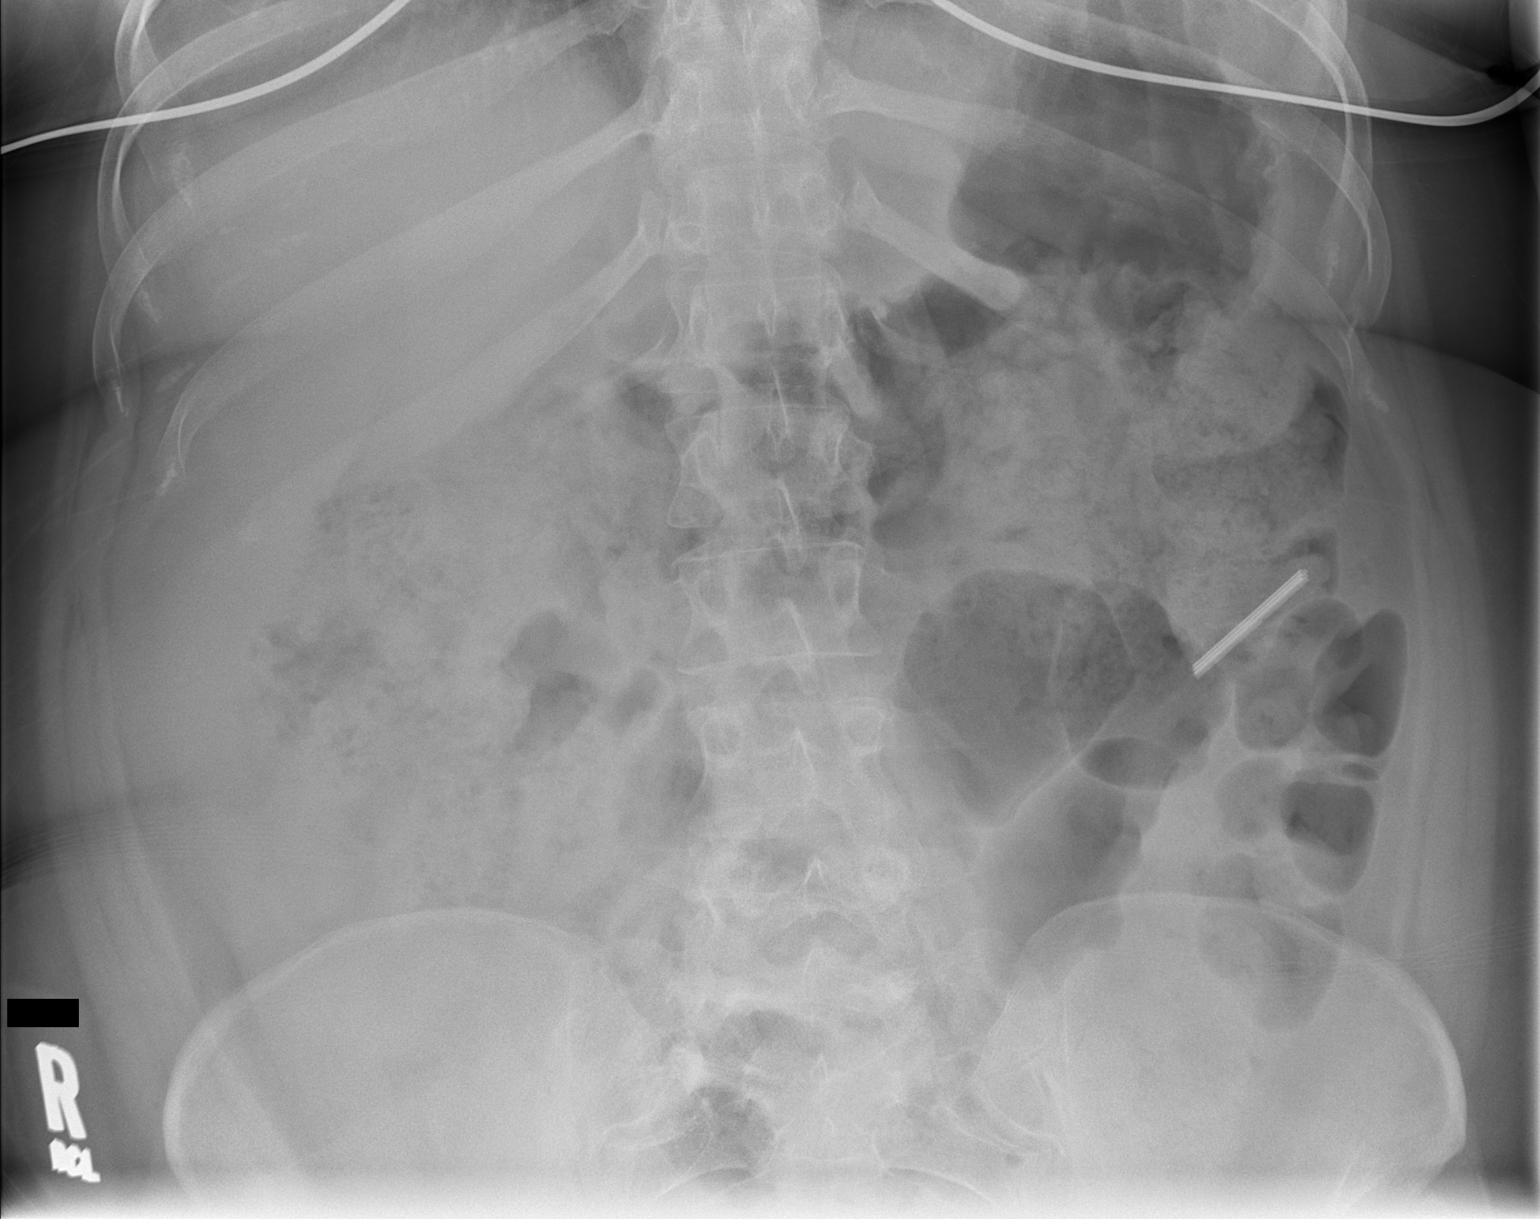
[im 2/2]
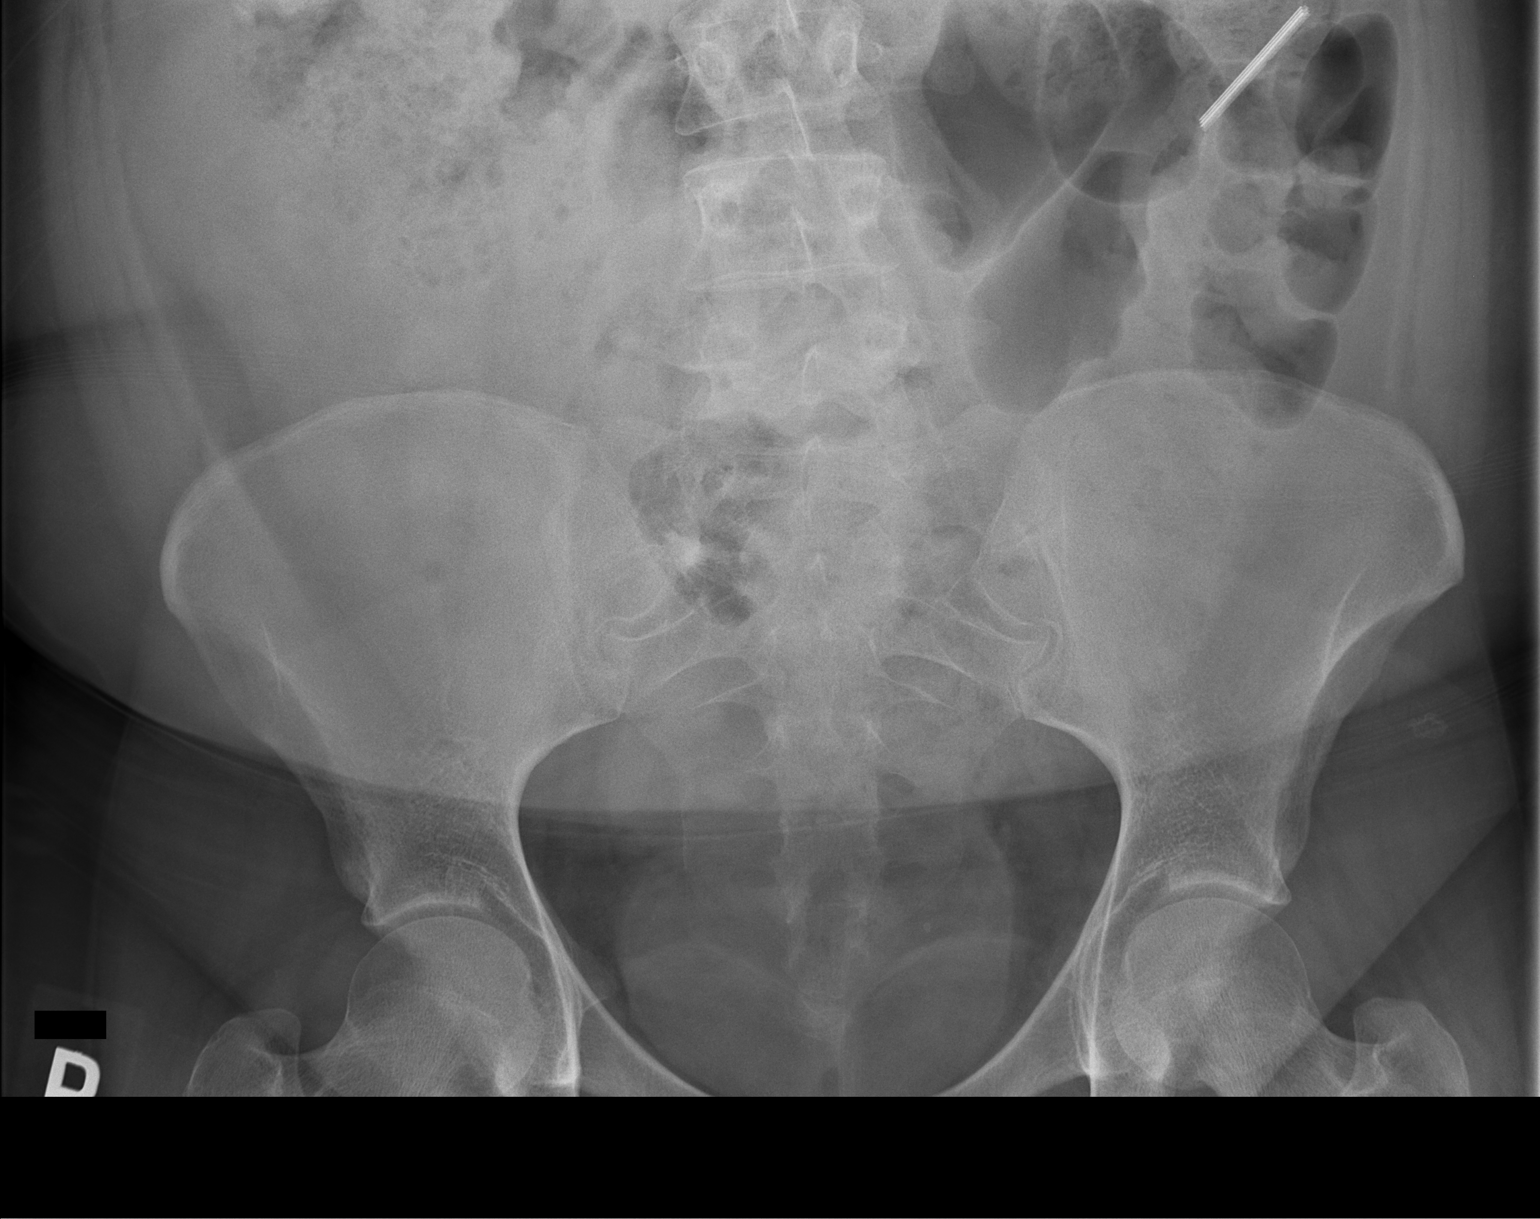

[2 of 2 positions shown; findings below may reference images not displayed]

IMPRESSION: Metallic densities are noted over the left mid abdomen and
similar to position as on prior study of [DATE].

[REDACTED]

## 2011-11-07 IMAGING — CT CT ABD-PELV W/O CM
1 of 2 series · 15 of 32 positions shown, 19 images · non-contrast
Comparison: none

REASON FOR EXAM: (1) Foreign body.  To evaluate for placement of razor
blade noted on abdominal x
COMMENTS:

PROCEDURE:     CT  - CT ABDOMEN AND PELVIS W[DATE]  [DATE]
RESULT:     Comparison: CT of the abdomen and pelvis [DATE] and abdominal
radiographs [DATE]
TECHNIQUE: Multiple axial images from the lung bases to the symphysis pubis
were obtained without oral and without intravenous contrast.

[Series 2: soft tissue · axial · 0.78mm/px · z∈[-60,+386]mm · 15 of 163 slices shown, 19 images]
[im 7/163  soft-tissue]
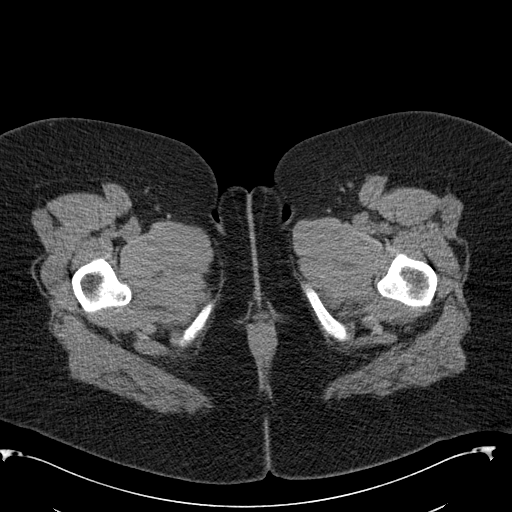
[im 7/163  bone]
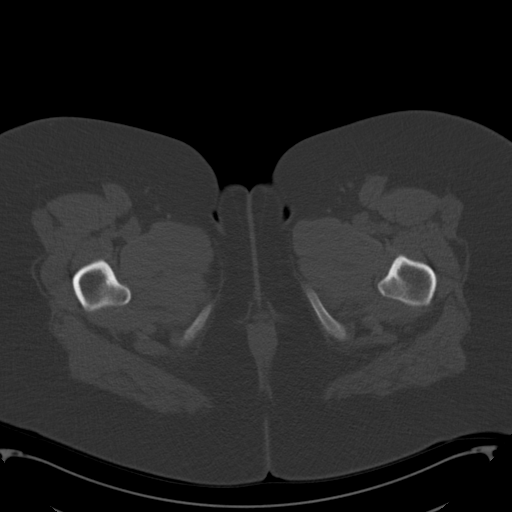
[im 20/163  soft-tissue]
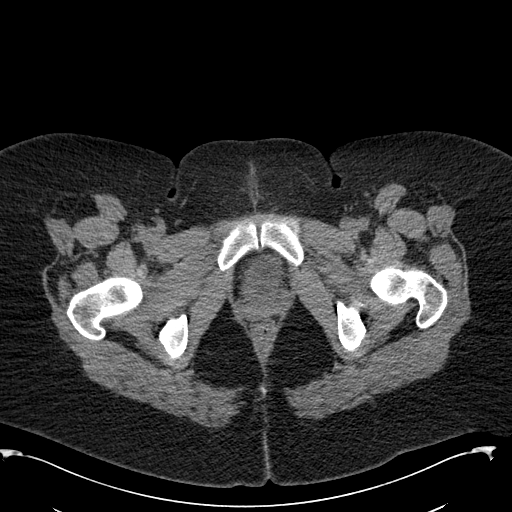
[im 33/163  soft-tissue]
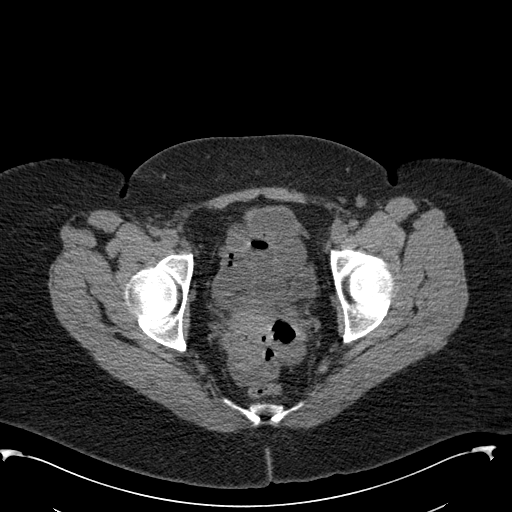
[im 46/163  soft-tissue]
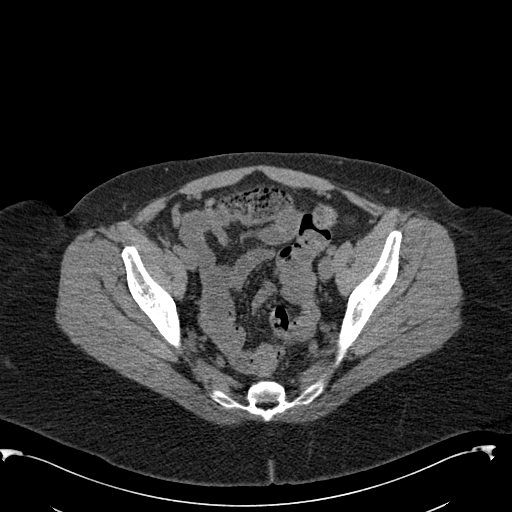
[im 59/163  soft-tissue]
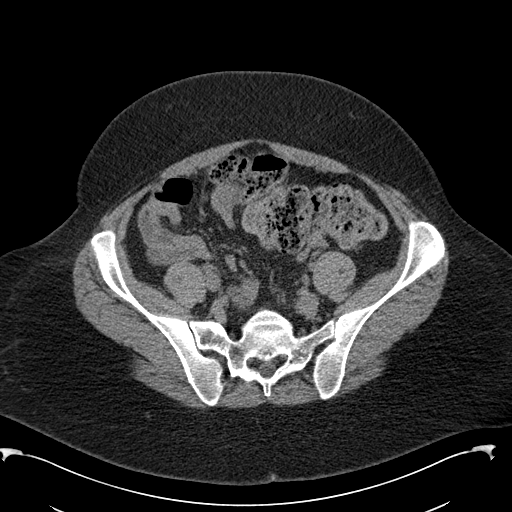
[im 72/163  soft-tissue]
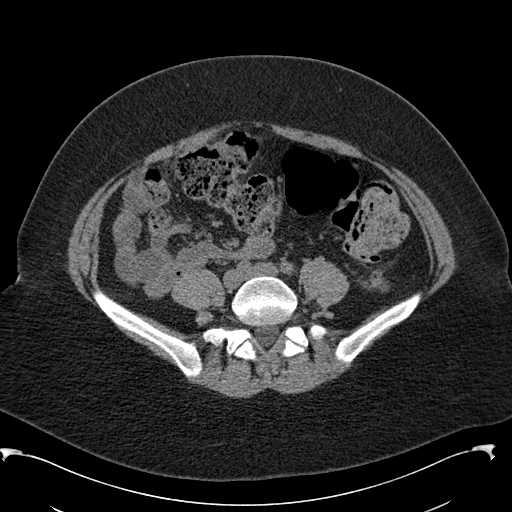
[im 85/163  soft-tissue]
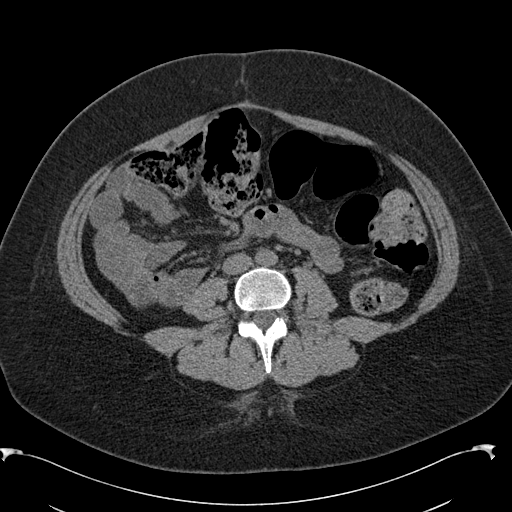
[im 91/163  soft-tissue]
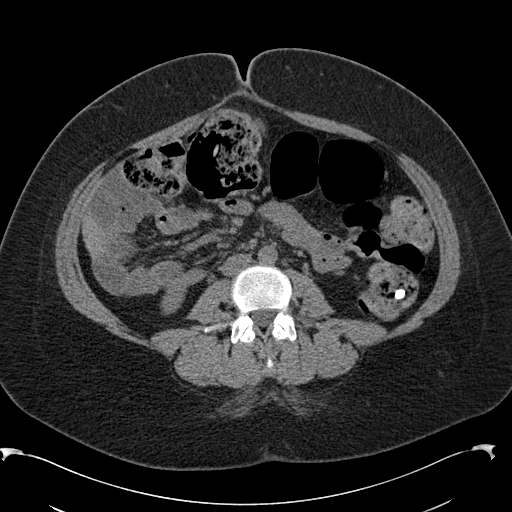
[im 104/163  soft-tissue]
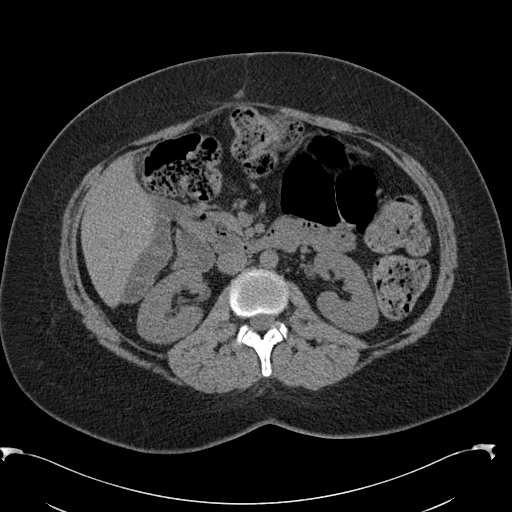
[im 104/163  bone]
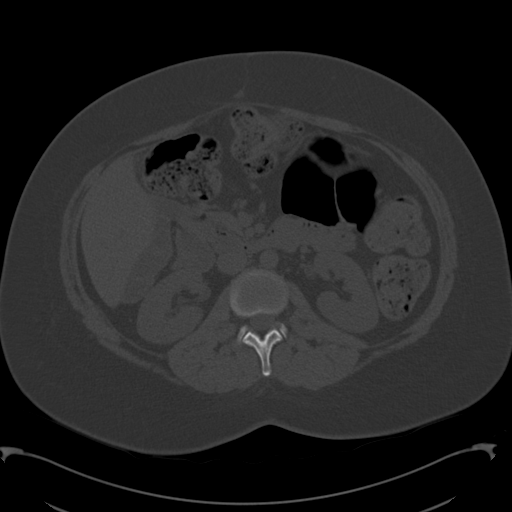
[im 117/163  soft-tissue]
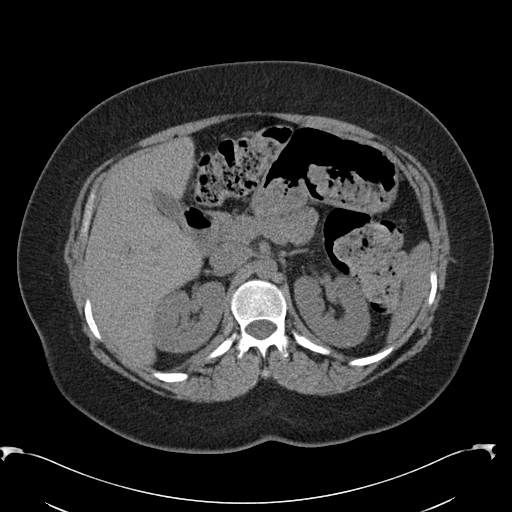
[im 130/163  soft-tissue]
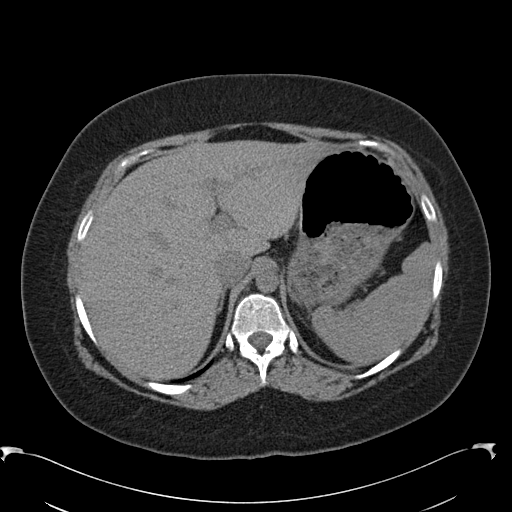
[im 137/163  lung]
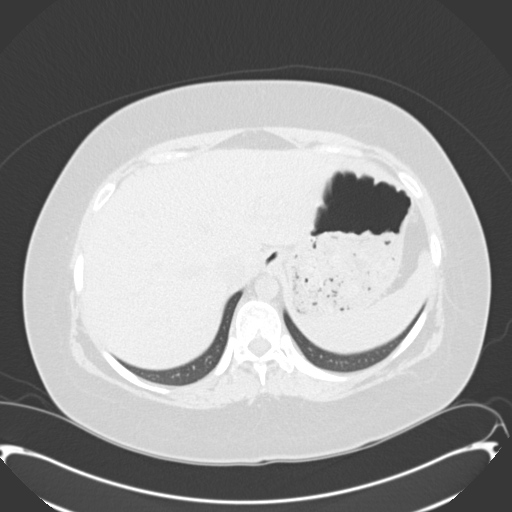
[im 143/163  soft-tissue]
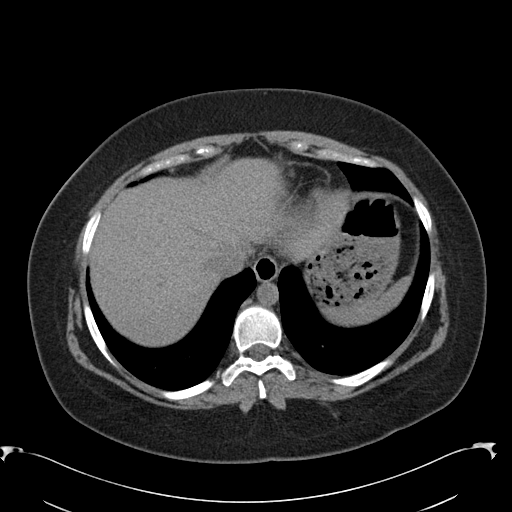
[im 143/163  lung]
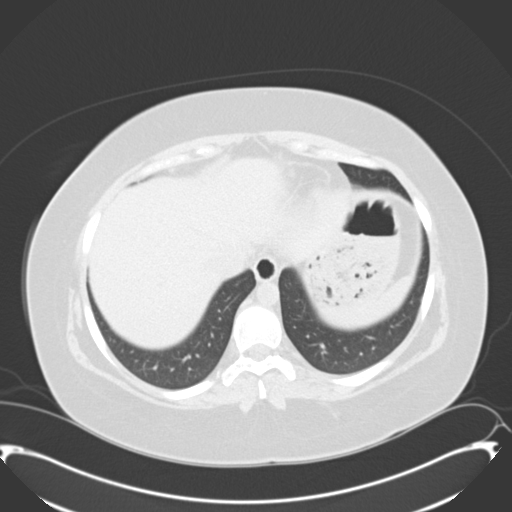
[im 150/163  lung]
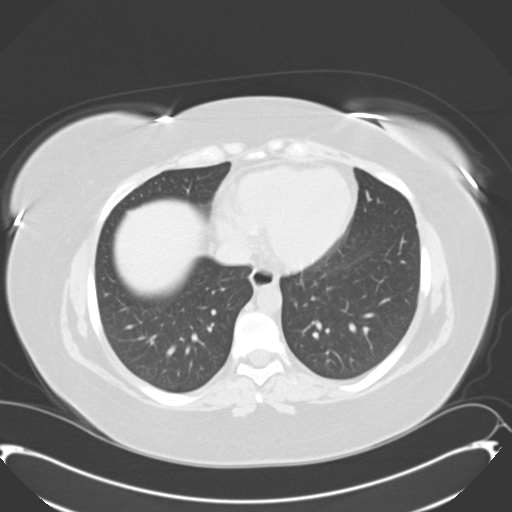
[im 156/163  soft-tissue]
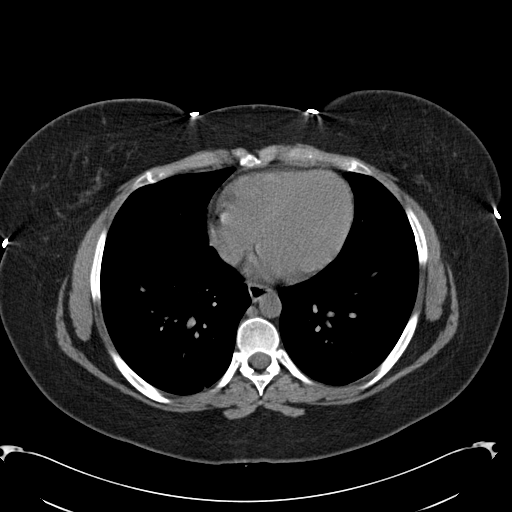
[im 156/163  lung]
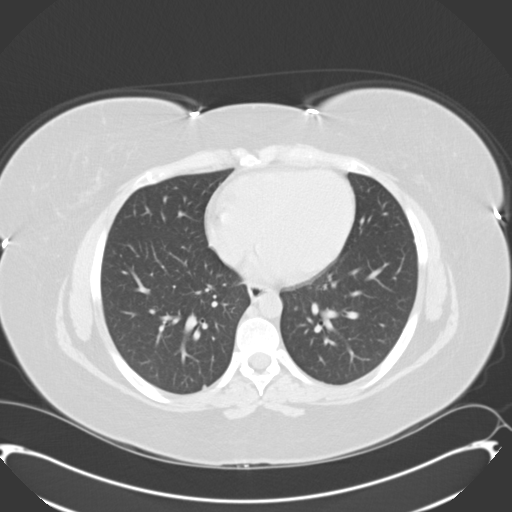

[15 of 32 positions shown; findings below may reference images not displayed]

FINDINGS: There is a 3 mm subpleural nodule in the right middle lobe. There is a 4 mm
subpleural nodule in the right lower lobe.

Lack of intravenous contrast limits evaluation of the solid abdominal
organs.  Grossly, the liver, gallbladder, the spleen, adrenals, and pancreas
are unremarkable. No renal calculi or hydronephrosis.

The small and large bowel are normal in caliber. There is stool throughout
the colon. A cylindrical metallic foreign body is identified within the
descending colon. There is no adjacent inflammatory stranding. No free fluid
in the abdomen or pelvis. The appendix is not identified. However, there are
no inflammatory changes in the right lower quadrant. No free intraperitoneal
air.

No aggressive lytic or sclerotic osseous lesions are identified.
IMPRESSION: The cylindrical metallic foreign body is within the descending colon.

## 2011-11-08 IMAGING — CR DG ABDOMEN 1V
1 series · 2 of 2 positions shown · non-contrast
Comparison: none

REASON FOR EXAM: assess position of razor blade
COMMENTS:

PROCEDURE:     DXR - DXR KIDNEY URETER BLADDER  - [DATE]  [DATE]
RESULT:     Comparison is made to the study [DATE].
Three linear metallic densities which can represent a shaving cartridge are
located in the region of the descending colon at the level of the left iliac
crest.

[Series 11: t abdomen supine · 0.14mm/px · 2 of 2 slices shown]
[im 1/2]
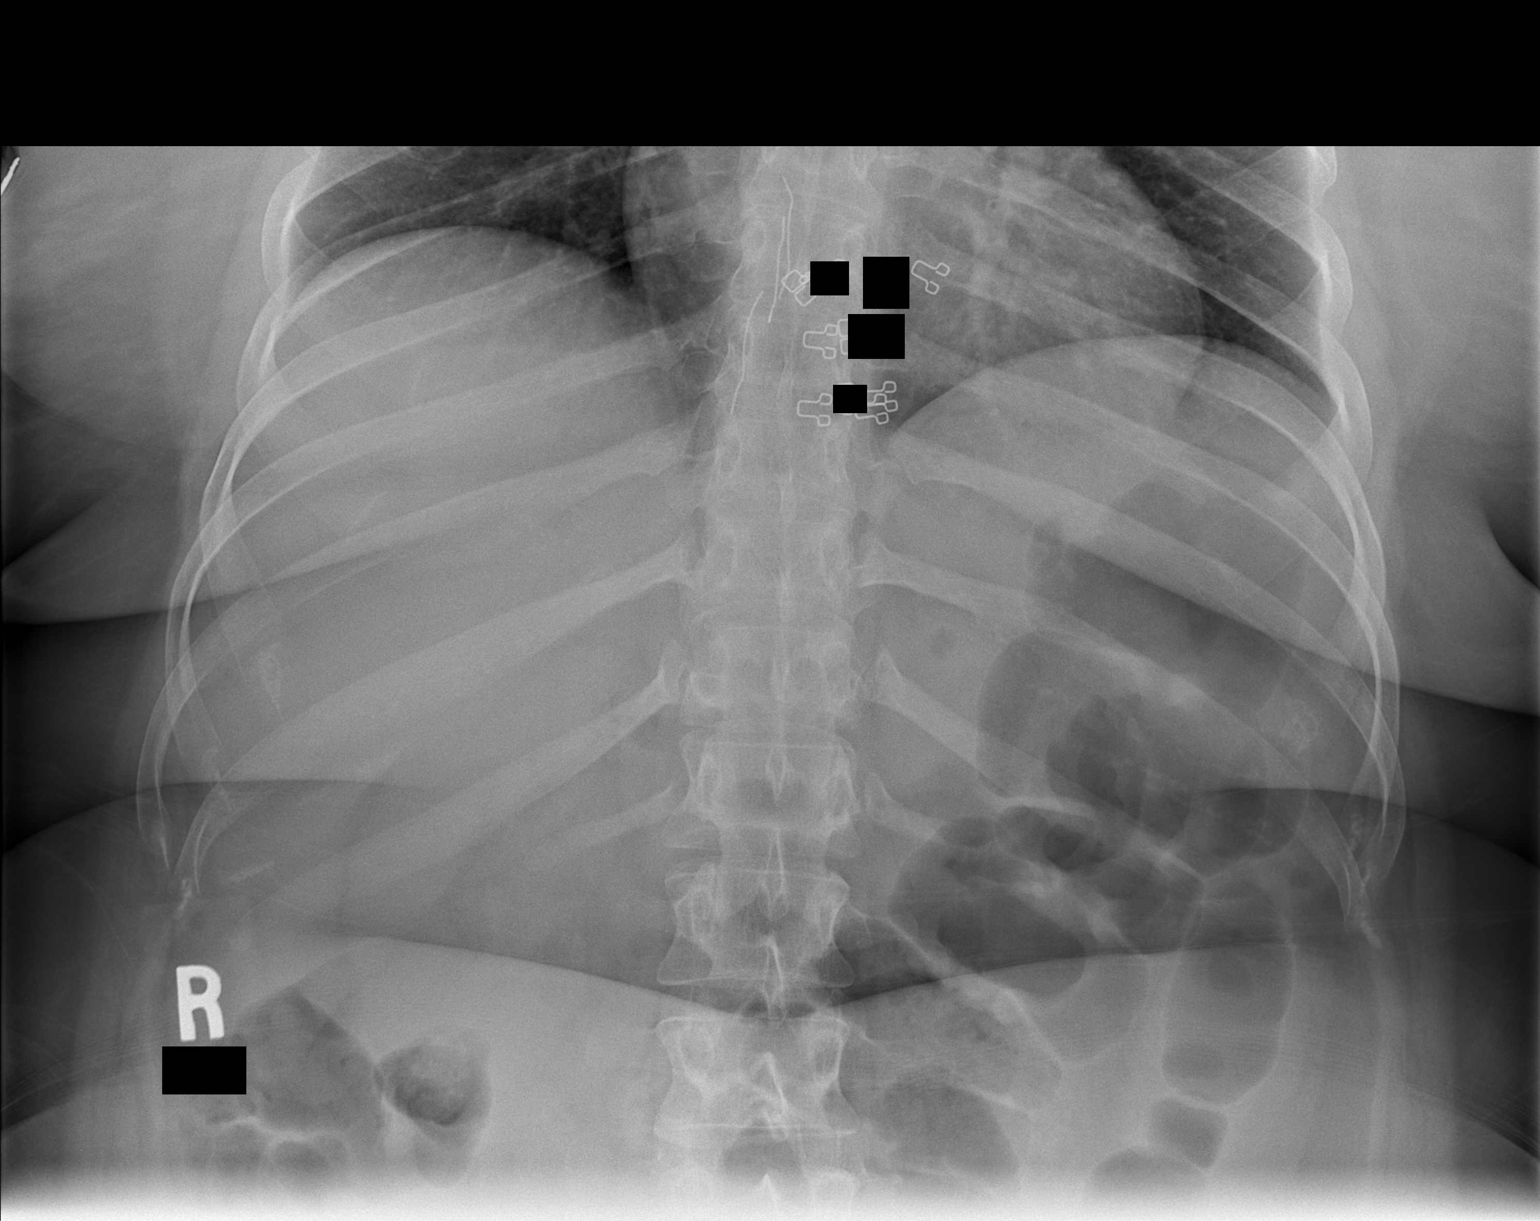
[im 2/2]
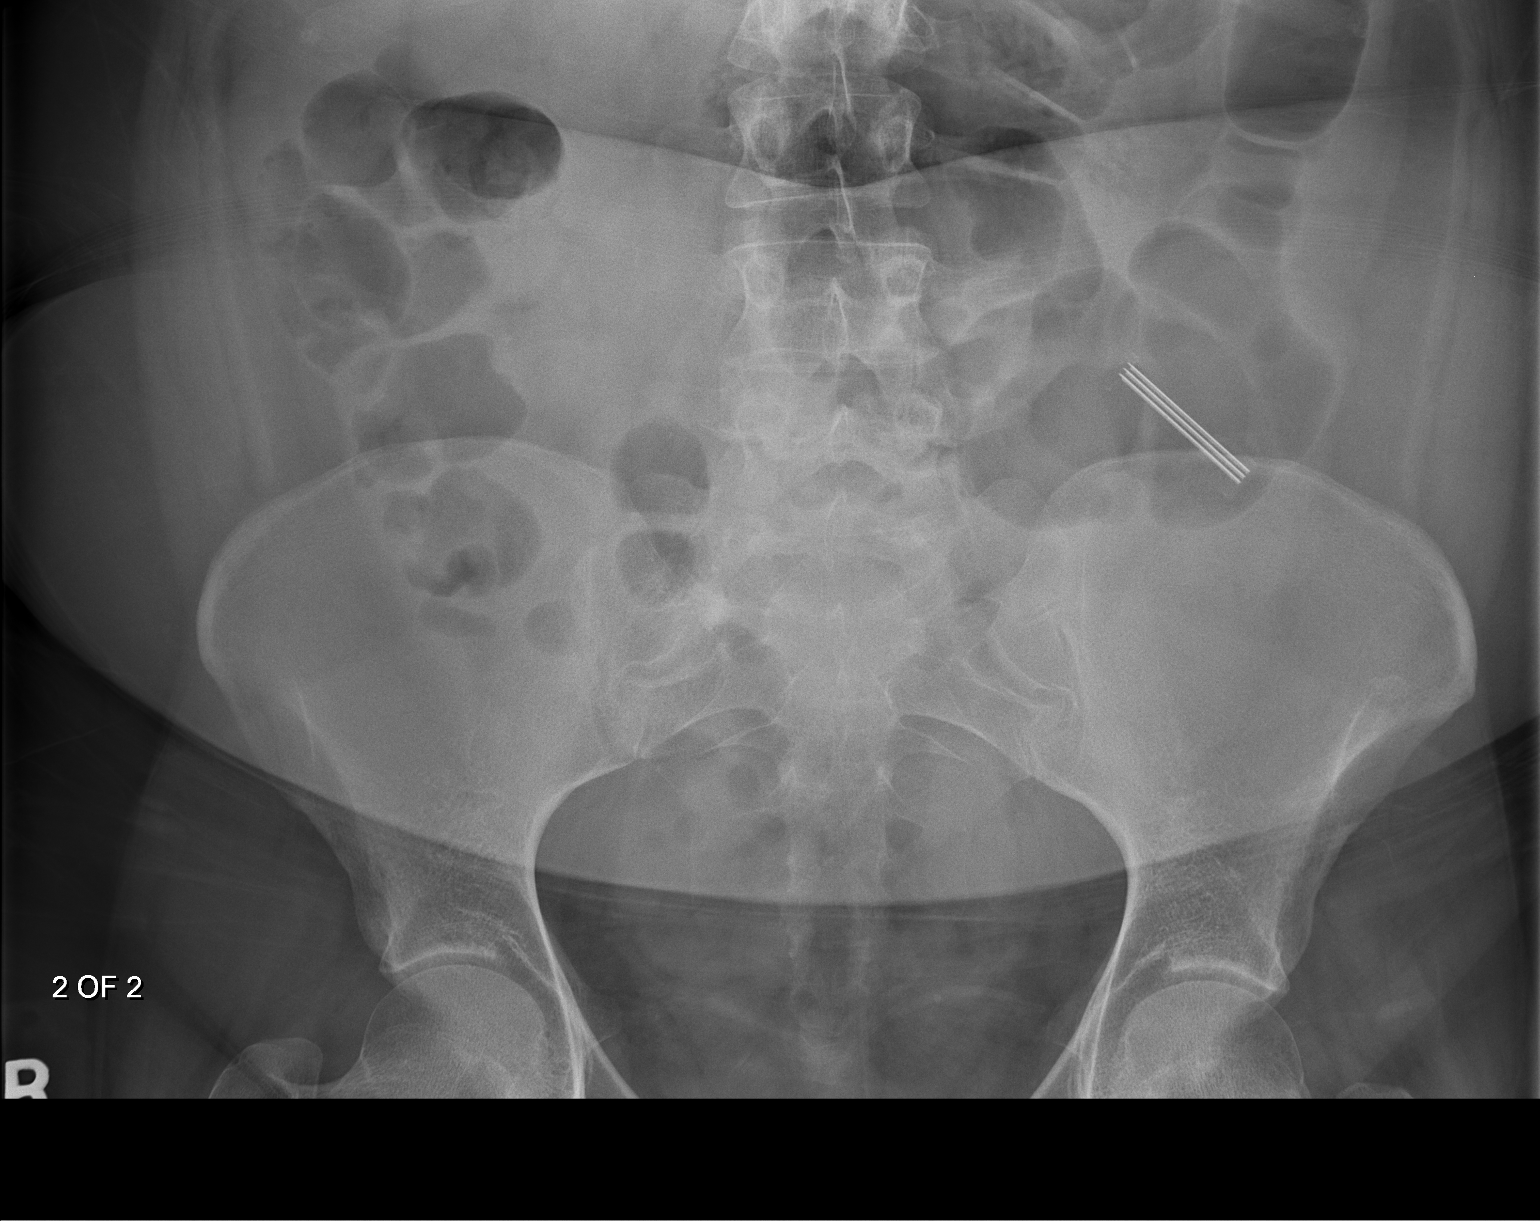

[2 of 2 positions shown; findings below may reference images not displayed]

IMPRESSION: Please see above.

[REDACTED]

## 2013-01-11 ENCOUNTER — Inpatient Hospital Stay: Payer: Self-pay | Admitting: Psychiatry

## 2013-01-11 LAB — COMPREHENSIVE METABOLIC PANEL
Albumin: 3.6 g/dL (ref 3.4–5.0)
BUN: 8 mg/dL (ref 7–18)
Calcium, Total: 9.4 mg/dL (ref 8.5–10.1)
Chloride: 111 mmol/L — ABNORMAL HIGH (ref 98–107)
EGFR (African American): >60
EGFR (Non-African Amer.): >60
Glucose: 94 mg/dL (ref 65–99)
Osmolality: 277 (ref 275–301)
Sodium: 140 mmol/L (ref 136–145)
Total Protein: 7.2 g/dL (ref 6.4–8.2)

## 2013-01-11 LAB — CBC
HCT: 39.3 % (ref 35.0–47.0)
HGB: 13.3 g/dL (ref 12.0–16.0)
MCH: 30.1 pg (ref 26.0–34.0)
MCHC: 33.7 g/dL (ref 32.0–36.0)
MCV: 89 fL (ref 80–100)
Platelet: 218 10*3/uL (ref 150–440)
RBC: 4.4 10*6/uL (ref 3.80–5.20)
WBC: 8.1 10*3/uL (ref 3.6–11.0)

## 2013-01-11 LAB — DRUG SCREEN, URINE
Amphetamines, Ur Screen: NEGATIVE (ref ?–1000)
Benzodiazepine, Ur Scrn: NEGATIVE (ref ?–200)
Cannabinoid 50 Ng, Ur ~~LOC~~: NEGATIVE (ref ?–50)
Cocaine Metabolite,Ur ~~LOC~~: NEGATIVE (ref ?–300)
MDMA (Ecstasy)Ur Screen: NEGATIVE (ref ?–500)
Methadone, Ur Screen: NEGATIVE (ref ?–300)
Phencyclidine (PCP) Ur S: NEGATIVE (ref ?–25)
Tricyclic, Ur Screen: NEGATIVE (ref ?–1000)

## 2013-01-11 LAB — ETHANOL: Ethanol %: <0.003 % (ref 0.000–0.080)

## 2013-01-11 LAB — URINALYSIS, COMPLETE
Bilirubin,UR: NEGATIVE
Protein: NEGATIVE
Specific Gravity: 1.01 (ref 1.003–1.030)
Squamous Epithelial: 28

## 2013-01-11 LAB — TSH: Thyroid Stimulating Horm: 0.92 u[IU]/mL

## 2013-01-11 LAB — PREGNANCY, URINE: Pregnancy Test, Urine: NEGATIVE m[IU]/mL

## 2013-01-11 IMAGING — CR DG ABDOMEN 1V
1 series · 1 of 1 positions shown · non-contrast
Comparison: none

REASON FOR EXAM: swallowed safety pin yesterday
COMMENTS:

[ap]
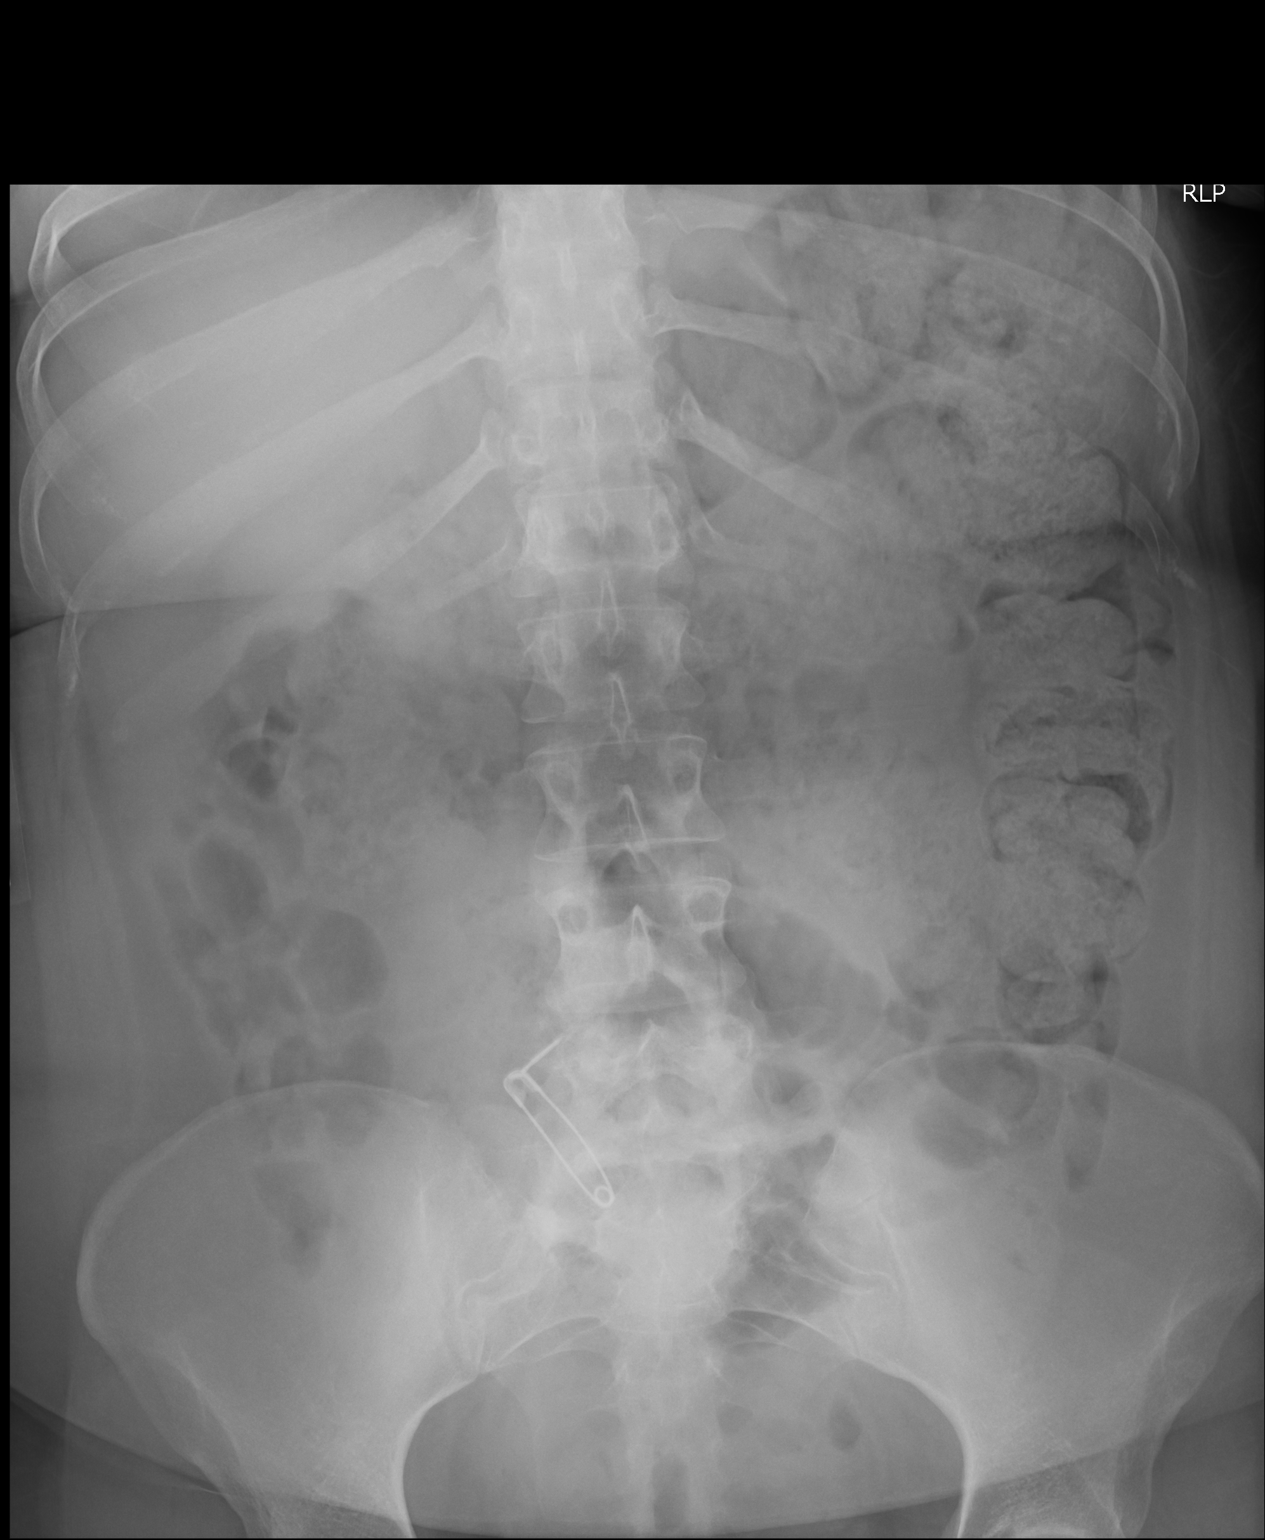

[1 of 1 positions shown; findings below may reference images not displayed]

PROCEDURE:     DXR - DXR ABDOMEN AP ONLY  - [DATE] [DATE]

RESULT:     The bowel gas pattern is consistent with constipation. There is
a metallic safety pin-like device present within adjacent metallic structure
at 90 degrees projecting over the lower lumbar spine just to the right of
midline. There is no evidence of ileus nor of obstruction.
IMPRESSION: 1. A foreign body is present consistent with a closed safety pin measuring
4.4 cm in length and an adjacent metallic 0.1 x 1.3 cm structure.
2. There is no evidence of bowel perforation nor definite evidence of
obstruction. There is considerable stool in the left colon consistent with
constipation.

[REDACTED]

## 2013-01-22 ENCOUNTER — Ambulatory Visit: Payer: Self-pay | Admitting: Psychiatry

## 2013-01-22 IMAGING — CR DG ABDOMEN 1V
1 series · 2 of 2 positions shown · non-contrast
Comparison: none

REASON FOR EXAM: Dx Foreign body
COMMENTS:

[Series 1: t abdomen supine · 0.14mm/px · 2 of 2 slices shown]
[im 1/2]
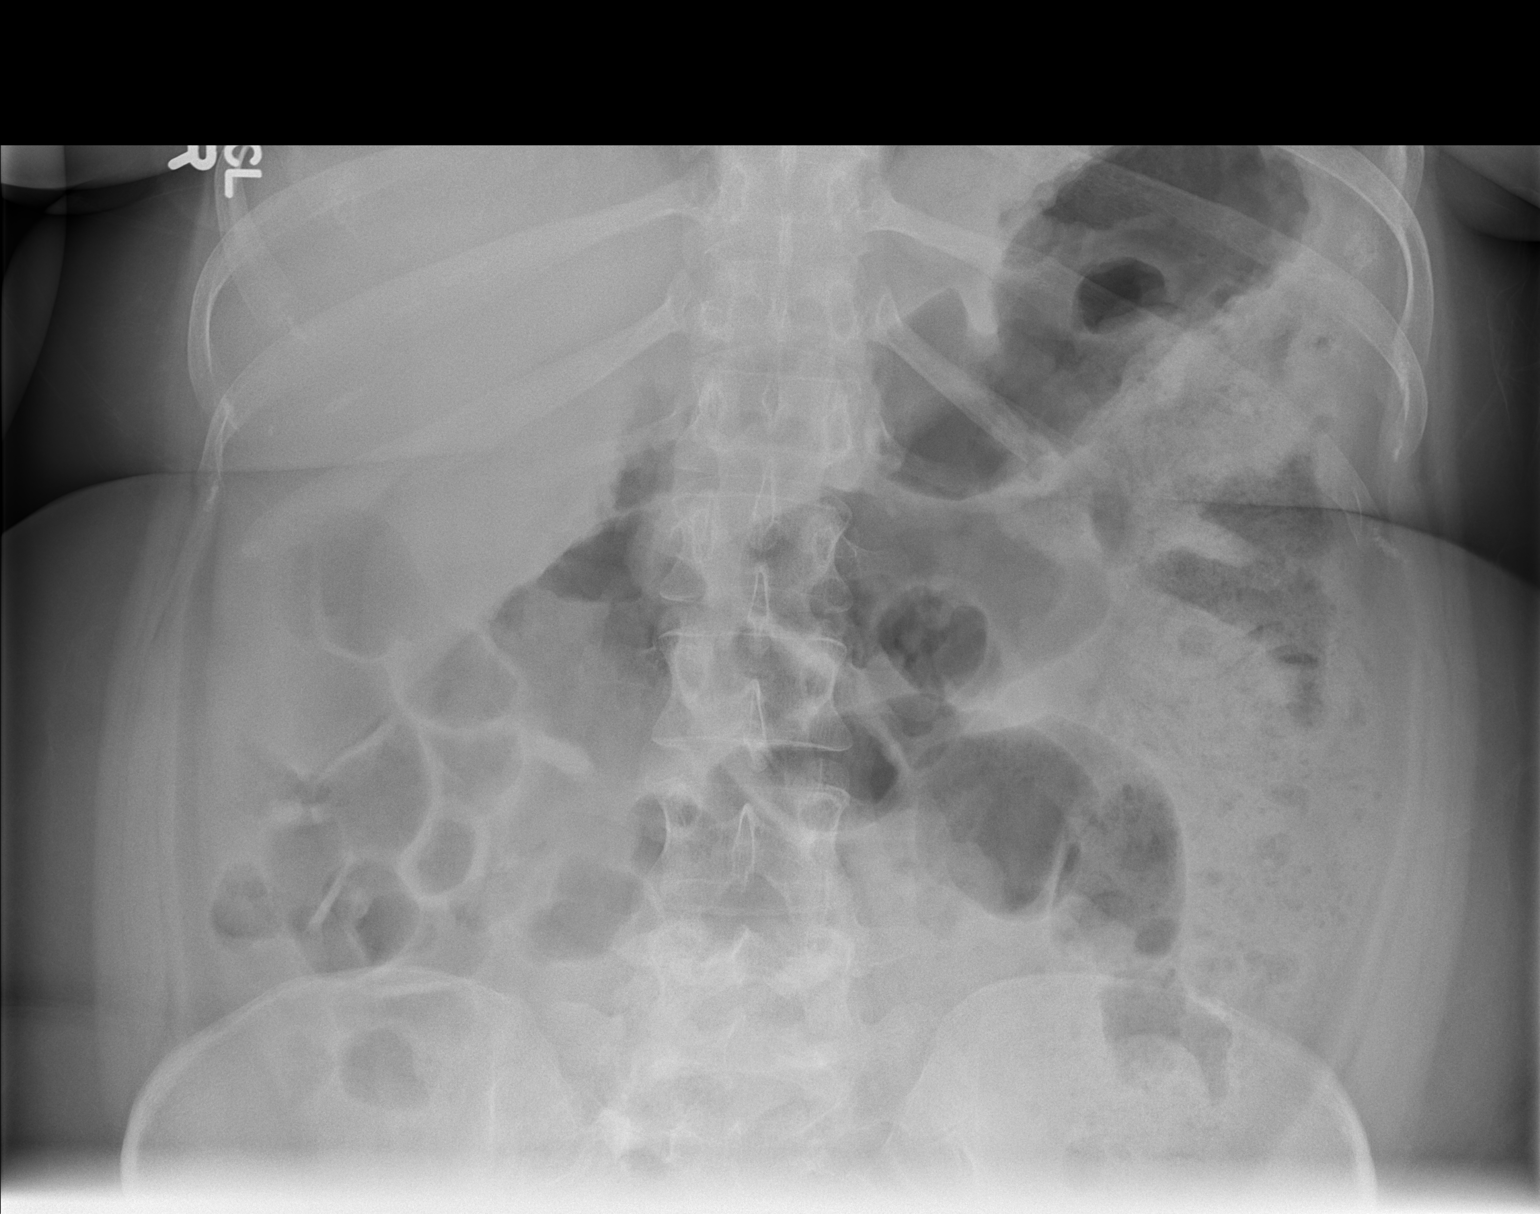
[im 2/2]
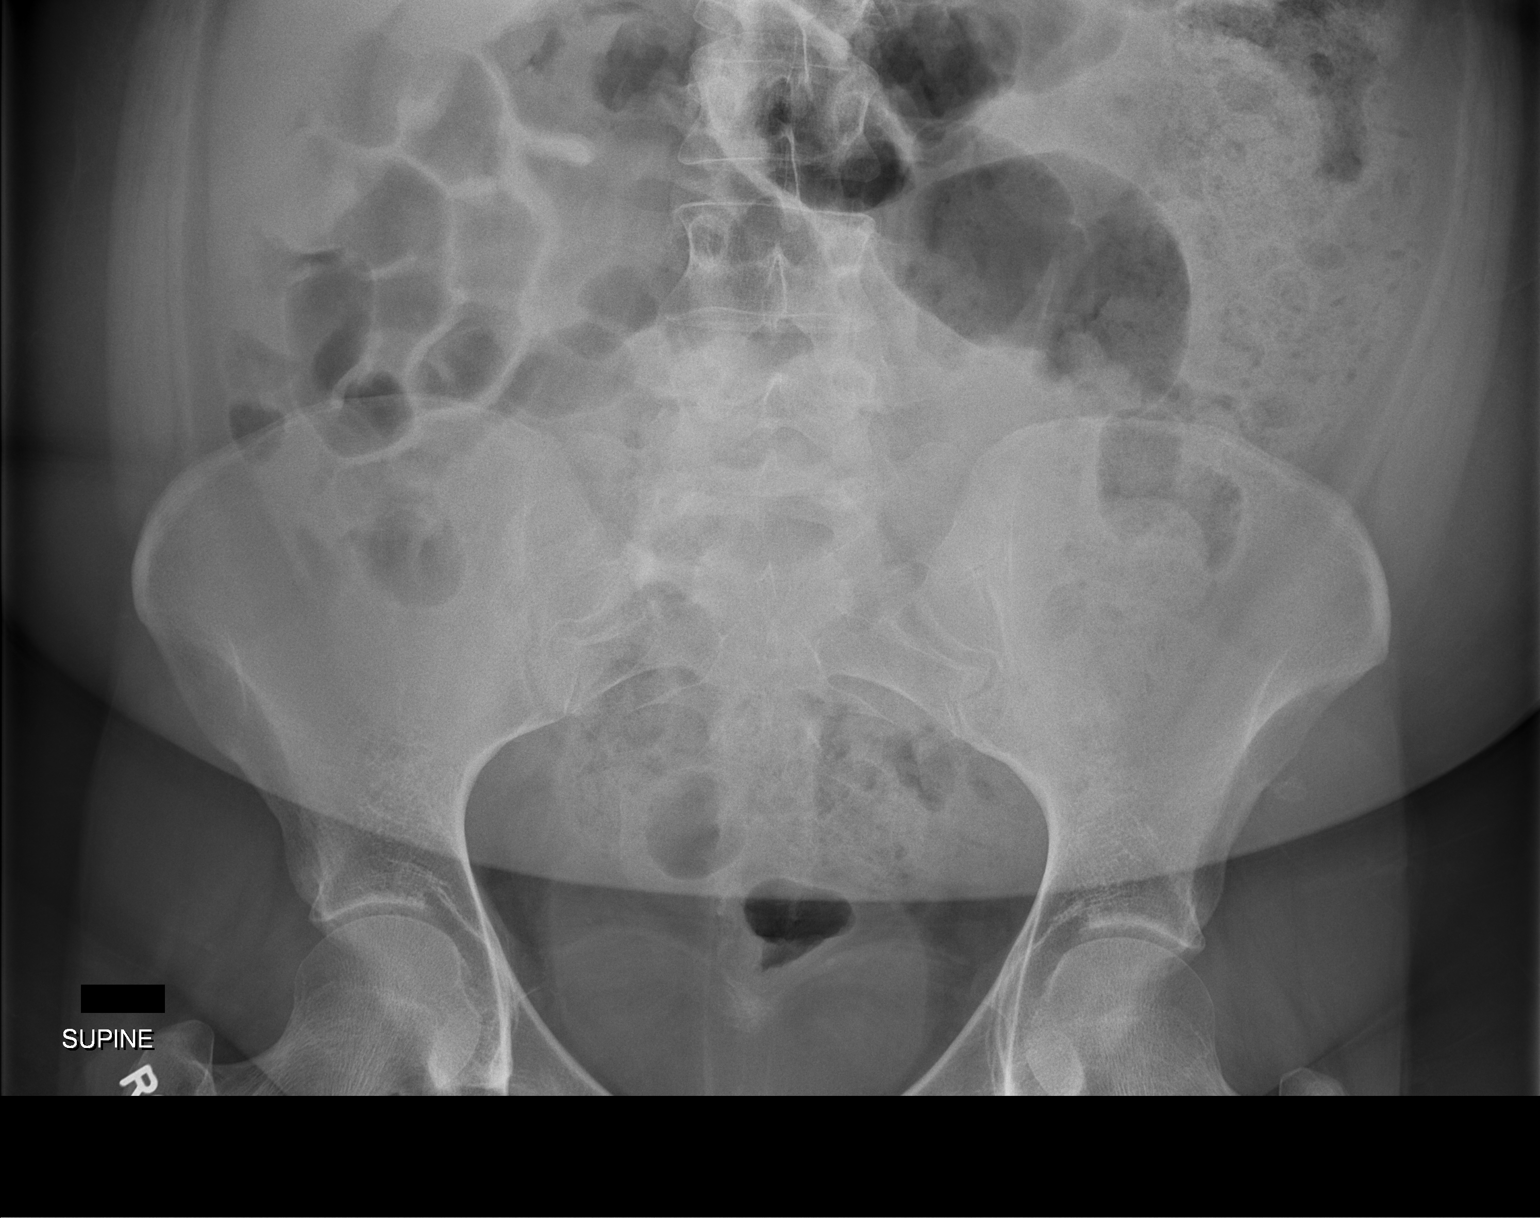

[2 of 2 positions shown; findings below may reference images not displayed]

PROCEDURE:     DXR - DXR ABDOMEN AP ONLY  - [DATE] [DATE]

RESULT:     Supine views of the abdomen reveal a normal bowel gas pattern.
There may be an element of constipation due to considerable stool noted in
the descending colon and rectosigmoid. There is no small or large bowel
obstruction. No abnormal soft tissue calcifications are demonstrated.
IMPRESSION: There is no acute intra-abdominal abnormality though I
cannot exclude constipation. No foreign body is evident.

[REDACTED]

## 2013-01-24 LAB — ETHANOL
Ethanol %: <0.003 % (ref 0.000–0.080)
Ethanol: <3 mg/dL

## 2013-01-24 LAB — URINALYSIS, COMPLETE
Bilirubin,UR: NEGATIVE
Ketone: NEGATIVE
Leukocyte Esterase: NEGATIVE
Ph: 7 (ref 4.5–8.0)
Protein: NEGATIVE
RBC,UR: NONE SEEN /HPF (ref 0–5)
Squamous Epithelial: 1
WBC UR: 2 /HPF (ref 0–5)

## 2013-01-24 LAB — DRUG SCREEN, URINE
Barbiturates, Ur Screen: NEGATIVE (ref ?–200)
Benzodiazepine, Ur Scrn: NEGATIVE (ref ?–200)
Cannabinoid 50 Ng, Ur ~~LOC~~: NEGATIVE (ref ?–50)
Cocaine Metabolite,Ur ~~LOC~~: NEGATIVE (ref ?–300)
Methadone, Ur Screen: NEGATIVE (ref ?–300)
Phencyclidine (PCP) Ur S: NEGATIVE (ref ?–25)
Tricyclic, Ur Screen: NEGATIVE (ref ?–1000)

## 2013-01-24 LAB — COMPREHENSIVE METABOLIC PANEL
Albumin: 3.5 g/dL (ref 3.4–5.0)
Alkaline Phosphatase: 112 U/L (ref 50–136)
Anion Gap: 6 — ABNORMAL LOW (ref 7–16)
Calcium, Total: 9.3 mg/dL (ref 8.5–10.1)
Creatinine: 0.69 mg/dL (ref 0.60–1.30)
EGFR (African American): >60
EGFR (Non-African Amer.): >60
Glucose: 75 mg/dL (ref 65–99)
Osmolality: 278 (ref 275–301)
Sodium: 141 mmol/L (ref 136–145)

## 2013-01-24 LAB — CBC
HCT: 36.5 % (ref 35.0–47.0)
HGB: 12.3 g/dL (ref 12.0–16.0)
MCH: 30 pg (ref 26.0–34.0)
MCHC: 33.5 g/dL (ref 32.0–36.0)
MCV: 90 fL (ref 80–100)
RDW: 13.4 % (ref 11.5–14.5)

## 2013-01-24 LAB — PREGNANCY, URINE: Pregnancy Test, Urine: NEGATIVE m[IU]/mL

## 2013-01-24 LAB — TSH: Thyroid Stimulating Horm: 2.1 u[IU]/mL

## 2013-01-24 LAB — LITHIUM LEVEL: Lithium: 0.52 mmol/L — ABNORMAL LOW

## 2013-01-25 ENCOUNTER — Inpatient Hospital Stay: Payer: Self-pay | Admitting: Psychiatry

## 2013-01-27 LAB — LIPID PANEL
Cholesterol: 201 mg/dL — ABNORMAL HIGH (ref 0–200)
HDL Cholesterol: 33 mg/dL — ABNORMAL LOW (ref 40–60)
VLDL Cholesterol, Calc: 18 mg/dL (ref 5–40)

## 2013-05-02 IMAGING — CR DG ABDOMEN 2V
1 series · 2 of 2 positions shown · non-contrast
Comparison: [DATE]

CLINICAL DATA: Foreign body followup

EXAM:
ABDOMEN - 2 VIEW

[Series 1: dxr abdomen 2 v flat and erect · 0.14mm/px · 2 of 2 slices shown]
[im 1/2]
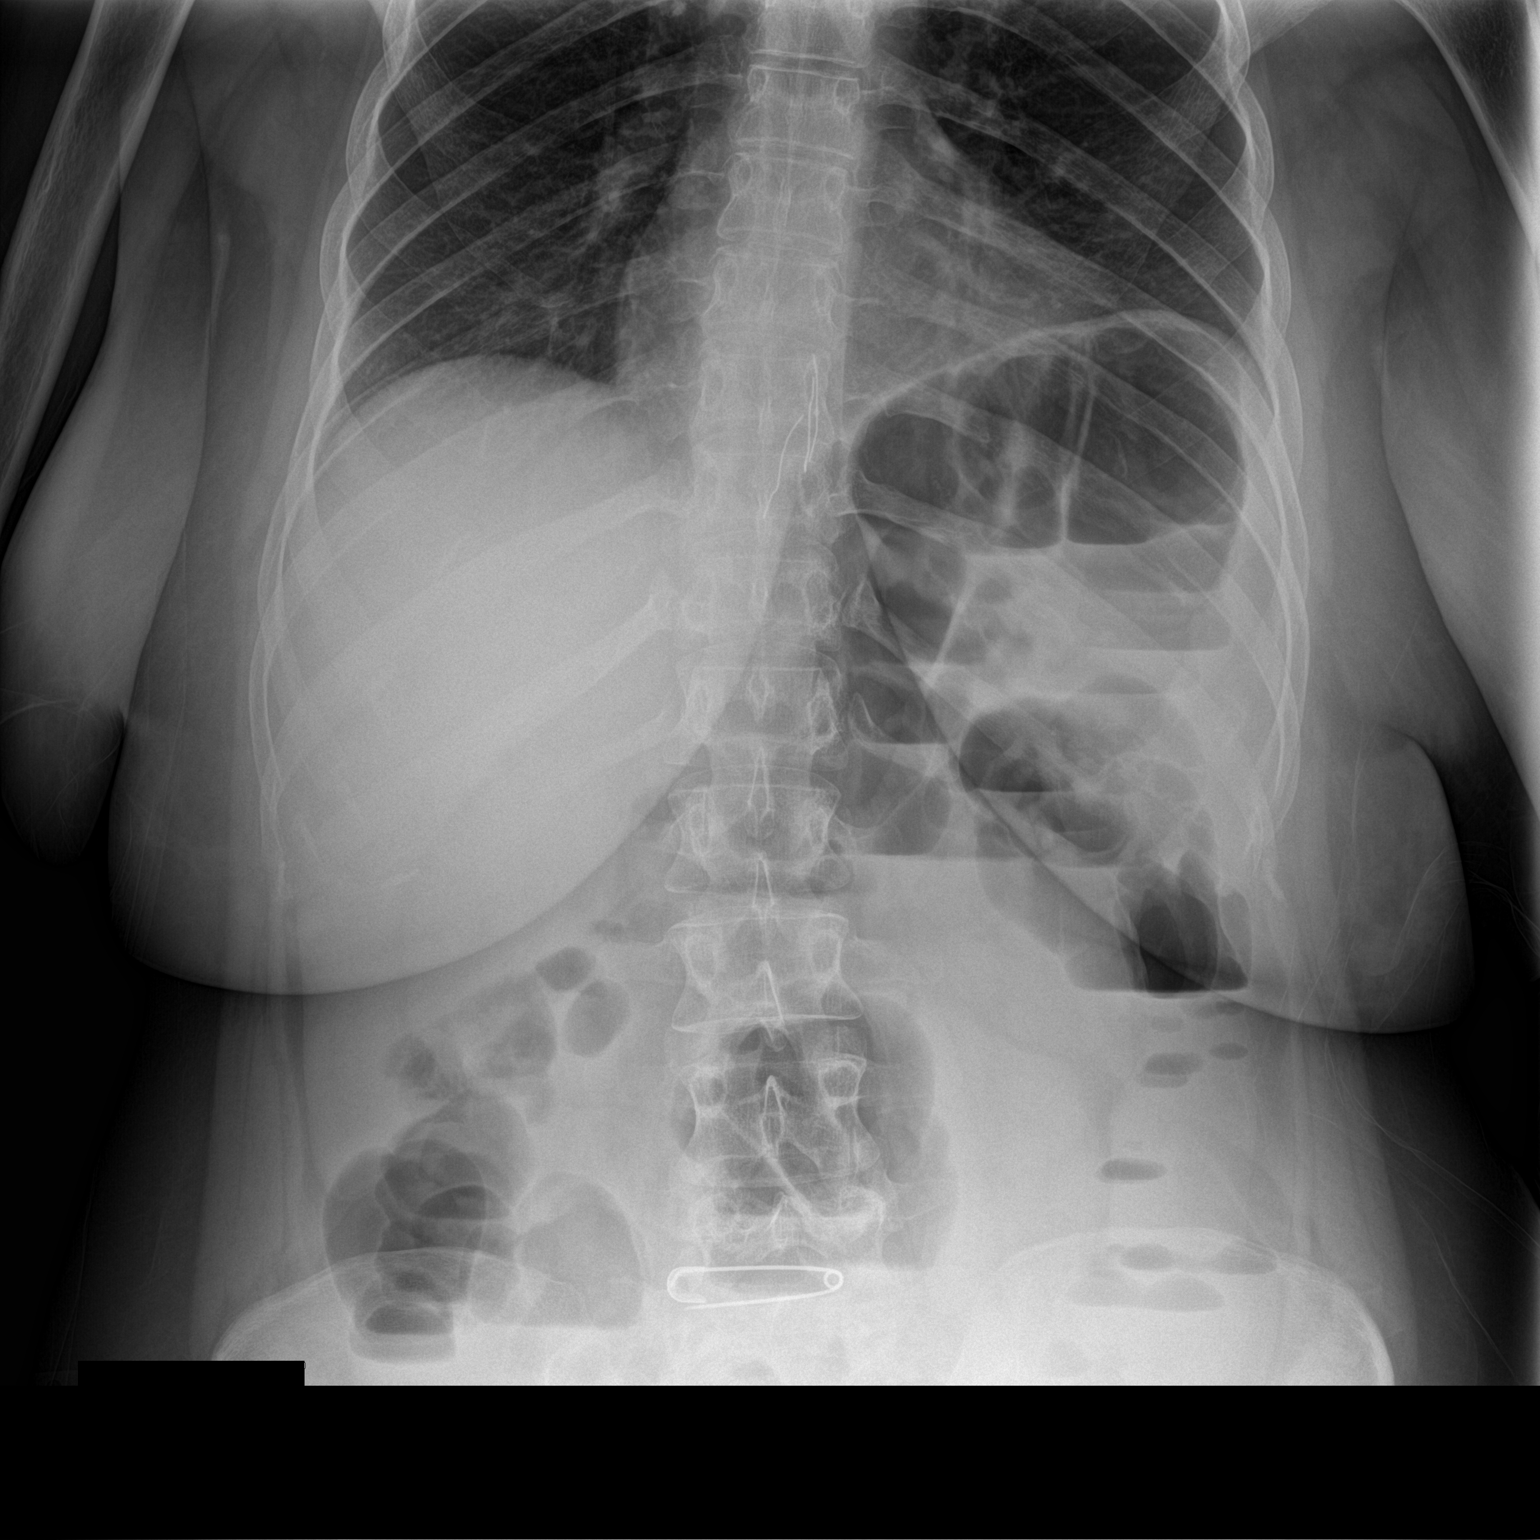
[im 2/2]
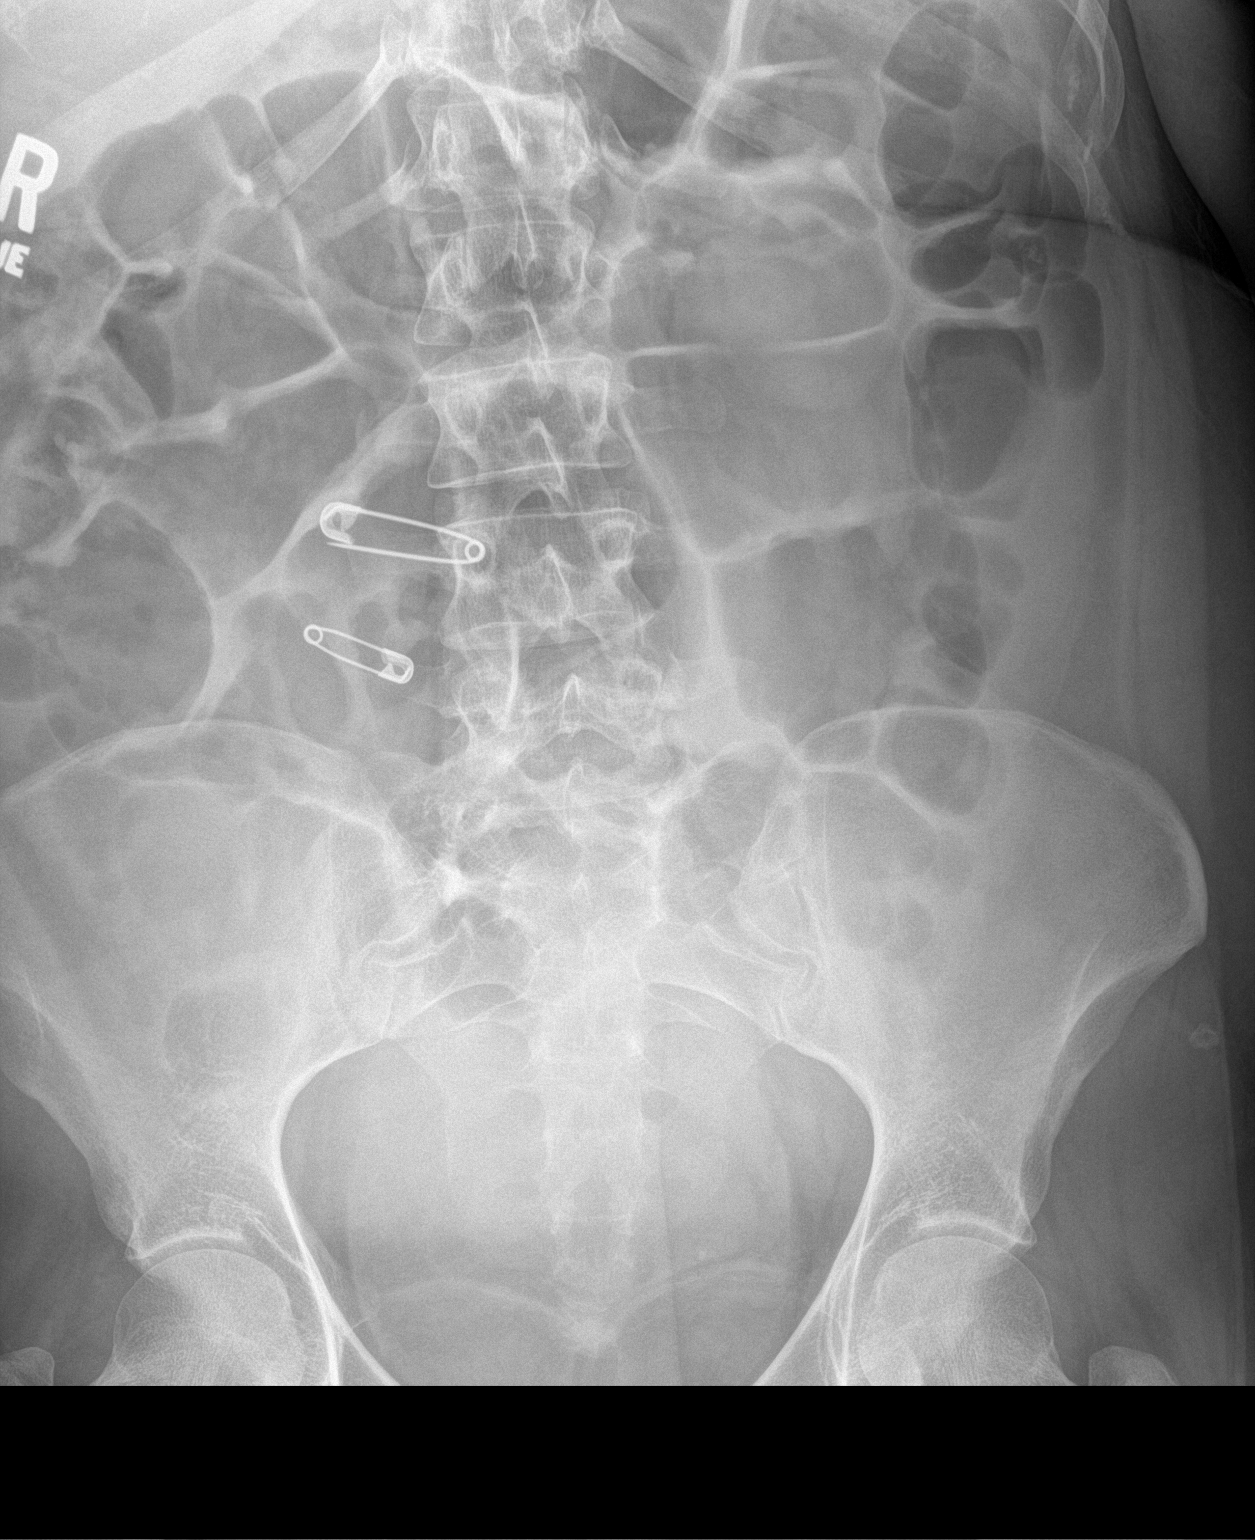

[2 of 2 positions shown; findings below may reference images not displayed]

FINDINGS: Two safety pins are identified within the right lower quadrant of
the abdomen. There is gaseous distension of the colon with numerous
colonic fluid levels identified. No free intraperitoneal air
identified
IMPRESSION: 1. Safety pins are noted in the right lower quadrant of the abdomen.
2. Progressive gaseous distension of the colon with numerous
air-fluid levels identified.

## 2013-07-01 ENCOUNTER — Inpatient Hospital Stay: Payer: Self-pay | Admitting: Surgery

## 2013-07-01 LAB — CBC
HCT: 37.9 % (ref 35.0–47.0)
HGB: 12.5 g/dL (ref 12.0–16.0)
MCH: 29.7 pg (ref 26.0–34.0)
MCHC: 32.9 g/dL (ref 32.0–36.0)
MCV: 90 fL (ref 80–100)
Platelet: 239 10*3/uL (ref 150–440)
RBC: 4.19 10*6/uL (ref 3.80–5.20)
RDW: 13.4 % (ref 11.5–14.5)
WBC: 9.7 10*3/uL (ref 3.6–11.0)

## 2013-07-01 LAB — URINALYSIS, COMPLETE
BACTERIA: NONE SEEN
BLOOD: NEGATIVE
Bilirubin,UR: NEGATIVE
Glucose,UR: NEGATIVE mg/dL (ref 0–75)
Ketone: NEGATIVE
Leukocyte Esterase: NEGATIVE
Nitrite: NEGATIVE
PH: 7 (ref 4.5–8.0)
PROTEIN: NEGATIVE
Specific Gravity: 1.008 (ref 1.003–1.030)
Squamous Epithelial: 2
WBC UR: 1 /HPF (ref 0–5)

## 2013-07-01 LAB — COMPREHENSIVE METABOLIC PANEL
ALBUMIN: 3.6 g/dL (ref 3.4–5.0)
ALT: 12 U/L (ref 12–78)
AST: 18 U/L (ref 15–37)
Alkaline Phosphatase: 111 U/L
Anion Gap: 5 — ABNORMAL LOW (ref 7–16)
BUN: 6 mg/dL — ABNORMAL LOW (ref 7–18)
Bilirubin,Total: 0.2 mg/dL (ref 0.2–1.0)
CREATININE: 0.74 mg/dL (ref 0.60–1.30)
Calcium, Total: 9.1 mg/dL (ref 8.5–10.1)
Chloride: 111 mmol/L — ABNORMAL HIGH (ref 98–107)
Co2: 23 mmol/L (ref 21–32)
EGFR (Non-African Amer.): >60
Glucose: 75 mg/dL (ref 65–99)
Osmolality: 274 (ref 275–301)
Potassium: 3.7 mmol/L (ref 3.5–5.1)
Sodium: 139 mmol/L (ref 136–145)
TOTAL PROTEIN: 7.1 g/dL (ref 6.4–8.2)

## 2013-07-01 LAB — DRUG SCREEN, URINE
Amphetamines, Ur Screen: NEGATIVE (ref ?–1000)
BENZODIAZEPINE, UR SCRN: NEGATIVE (ref ?–200)
Barbiturates, Ur Screen: NEGATIVE (ref ?–200)
COCAINE METABOLITE, UR ~~LOC~~: NEGATIVE (ref ?–300)
Cannabinoid 50 Ng, Ur ~~LOC~~: NEGATIVE (ref ?–50)
MDMA (ECSTASY) UR SCREEN: NEGATIVE (ref ?–500)
Methadone, Ur Screen: NEGATIVE (ref ?–300)
OPIATE, UR SCREEN: NEGATIVE (ref ?–300)
Phencyclidine (PCP) Ur S: NEGATIVE (ref ?–25)
Tricyclic, Ur Screen: NEGATIVE (ref ?–1000)

## 2013-07-01 LAB — LITHIUM LEVEL: LITHIUM: 0.58 mmol/L — AB

## 2013-07-01 LAB — ETHANOL: Ethanol %: <0.003 % (ref 0.000–0.080)

## 2013-07-01 LAB — ACETAMINOPHEN LEVEL: Acetaminophen: <2 ug/mL

## 2013-07-01 LAB — PREGNANCY, URINE: PREGNANCY TEST, URINE: NEGATIVE m[IU]/mL

## 2013-07-01 LAB — SALICYLATE LEVEL: Salicylates, Serum: 2 mg/dL

## 2013-07-01 IMAGING — CR DG ABDOMEN 3V
1 series · 4 of 4 positions shown · non-contrast
Comparison: DG ABDOMEN 1V dated [DATE];

CLINICAL DATA: Foreign body ingestion, an open safety pin and a
closed safety pin.

EXAM:
ACUTE ABDOMEN SERIES (2 VIEW ABDOMEN AND 1 VIEW CHEST)

[Series 1: ap · 0.17mm/px · 4 of 4 slices shown]
[im 1/4]
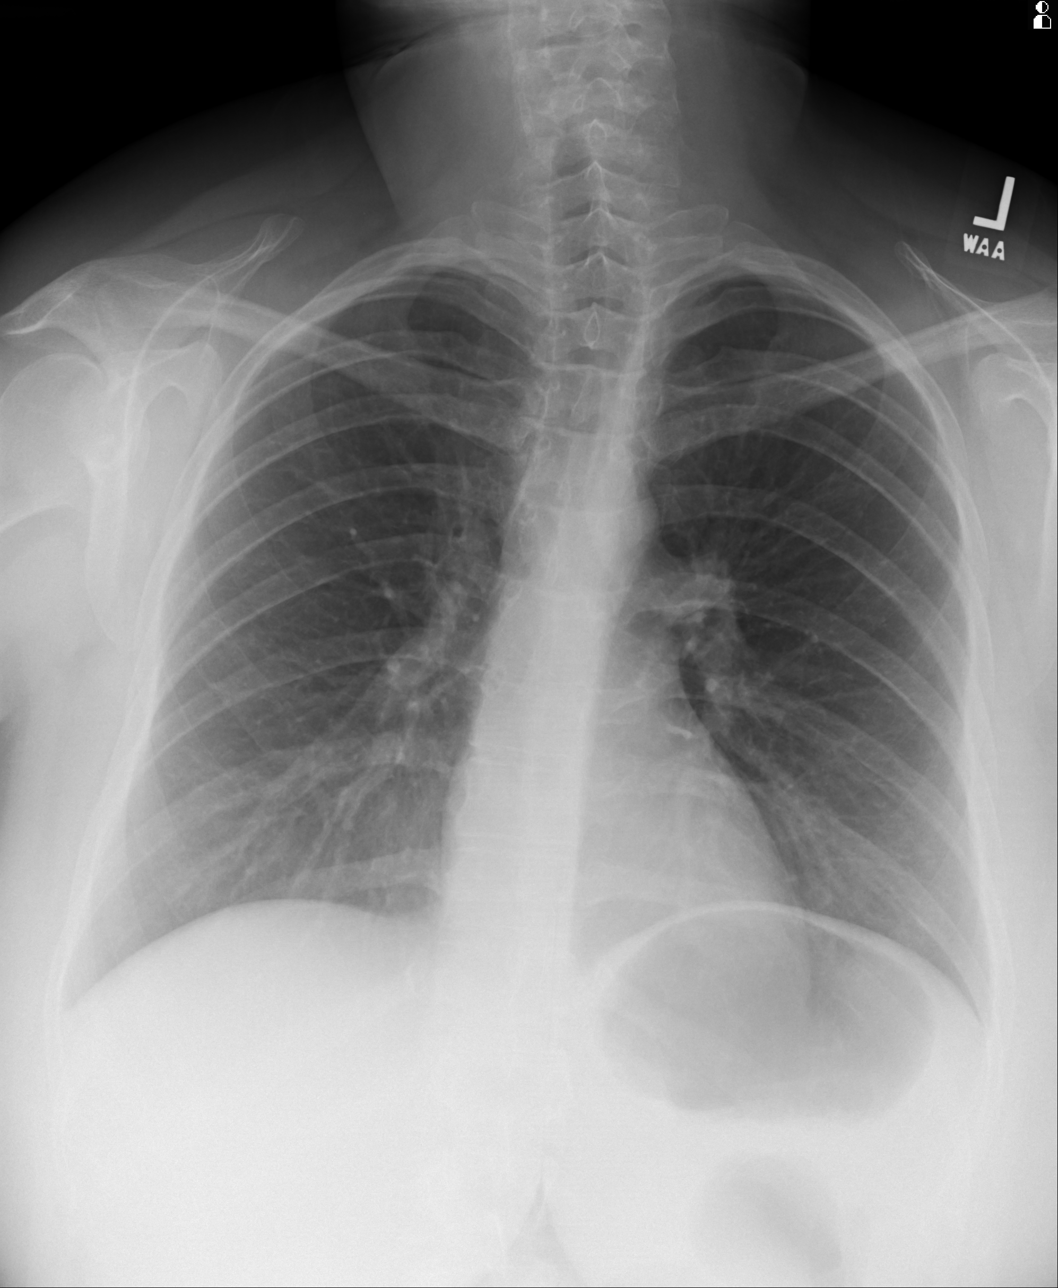
[im 2/4]
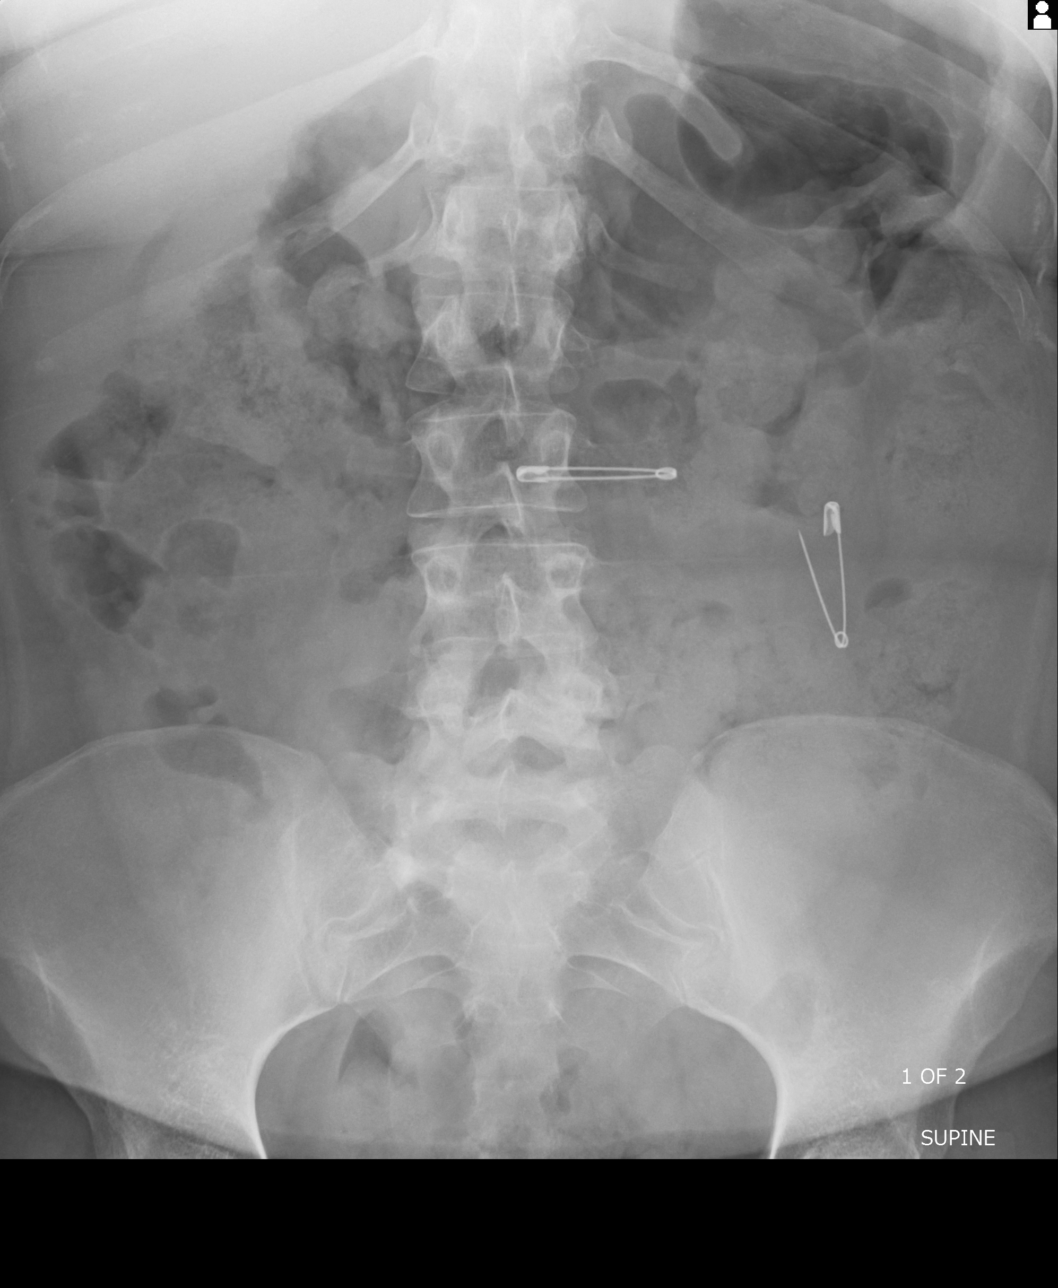
[im 3/4]
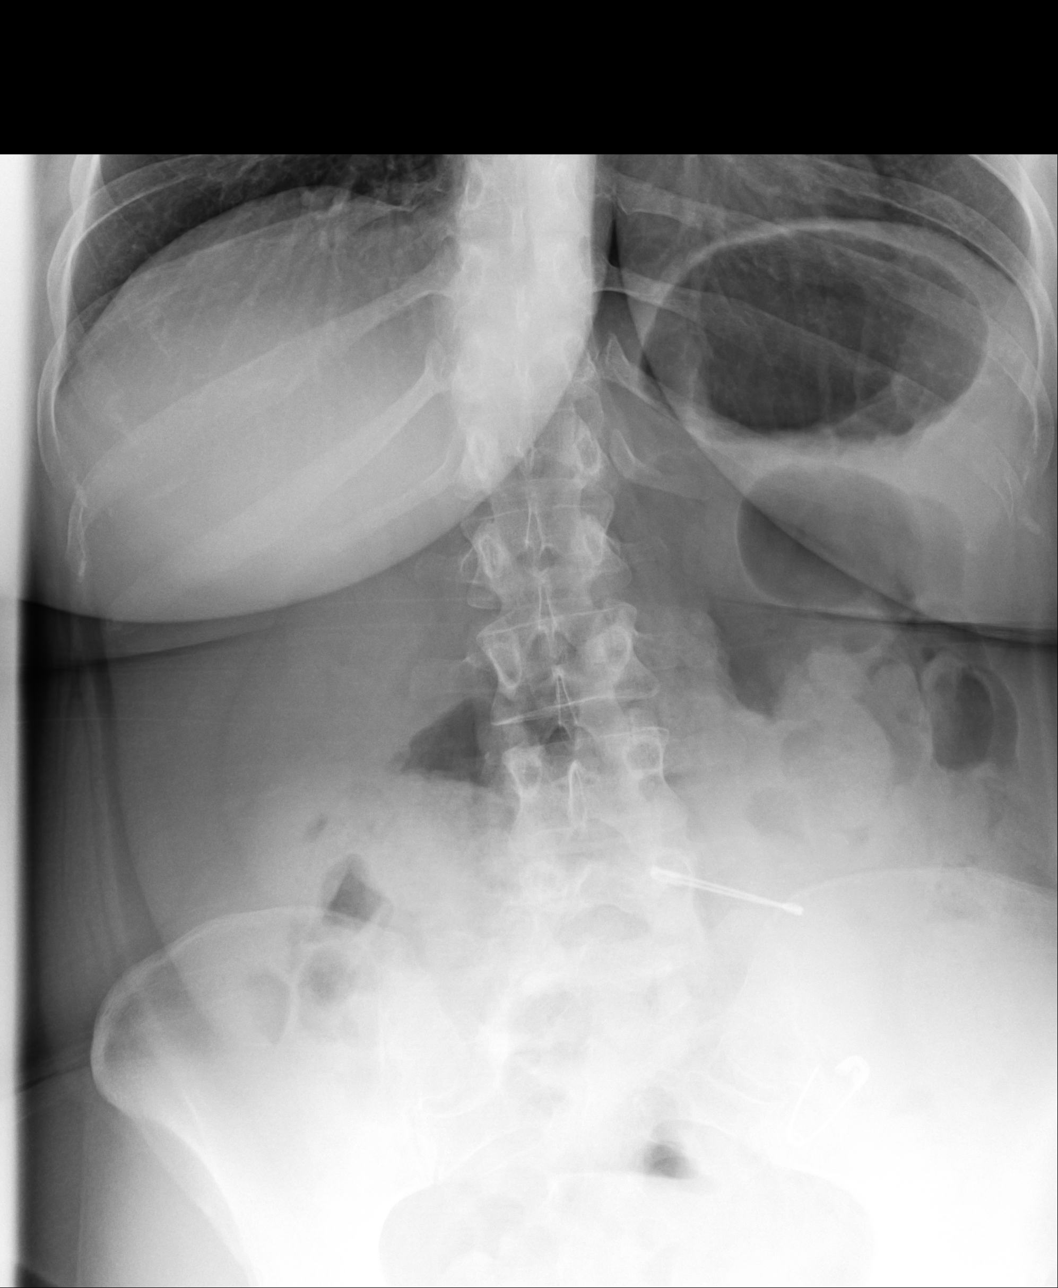
[im 4/4]
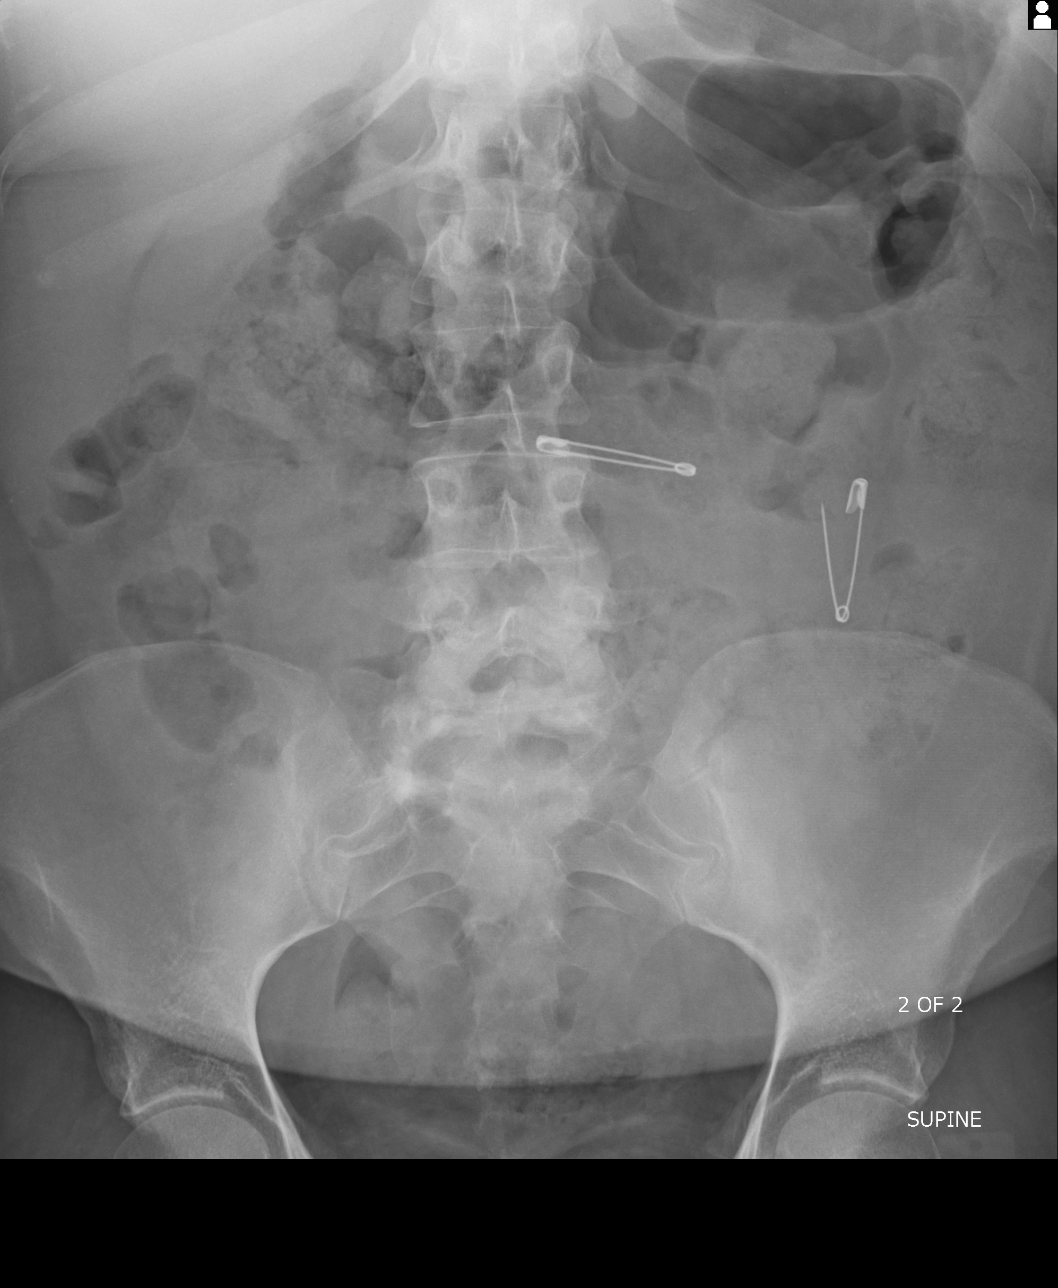

[4 of 4 positions shown; findings below may reference images not displayed]

DG ABDOMEN 1V dated
[DATE]; DG ABDOMEN 1V dated [DATE]; DG ABDOMEN 1V dated
[DATE]
FINDINGS: Foreign body consistent with a closed pin just to the left of
midline in the upper abdomen, likely either in a loop of jejunum or
the transverse colon. Foreign body consistent with an open pin to
the left of midline in the mid abdomen, likely within a jejunal loop
as it does not overlie the stool-filled colon. Bowel gas pattern
unremarkable without evidence of obstruction. No evidence of free
air on the erect image. Transitional lumbosacral segment noted with
well defined assimilation joints between its transverse processes in
the first sacral segment.

Cardiomediastinal silhouette unremarkable. Lungs clear.
Bronchovascular markings normal. Pulmonary vascularity normal. No
visible pleural effusions. No pneumothorax. Upper thoracic scoliosis
convex left.
IMPRESSION: 1. Foreign bodies consistent with safety pins, 1 open and 1 closed,
overlying the upper abdomen. The closed pin is either in a jejunal
loop just to the left of midline or in the transverse colon. The
open pin is likely in a loop of jejunum to the left of midline.
2. No evidence of bowel obstruction or free intraperitoneal air.
3.  No acute cardiopulmonary disease.

## 2013-07-01 IMAGING — CT CT ABD-PELV W/O CM
2 of 4 series · 16 of 46 positions shown, 18 images · non-contrast
Comparison: [DATE] abdominal radiographs common [DATE] CT
without contrast

CLINICAL DATA: Foreign body ingestion, patient swallowed 2 safety
pins

EXAM:
CT ABDOMEN AND PELVIS WITHOUT CONTRAST
TECHNIQUE: Multidetector CT imaging of the abdomen and pelvis was performed
following the standard protocol without IV contrast.

[Series 2: routine abd pel without · axial · non-contrast · 0.84mm/px · z∈[-481,-66]mm · 13 of 91 slices shown, 15 images]
[im 4/91  soft-tissue]
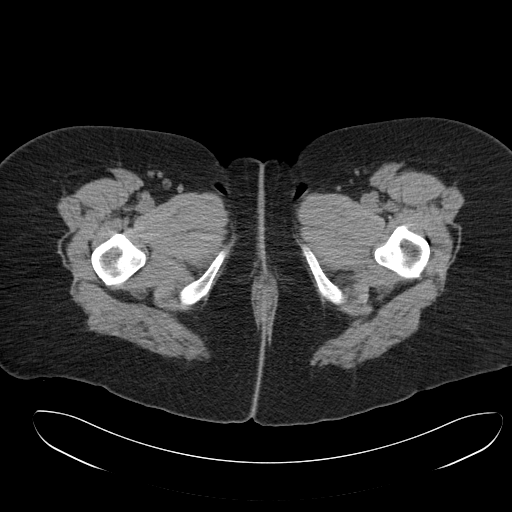
[im 4/91  bone]
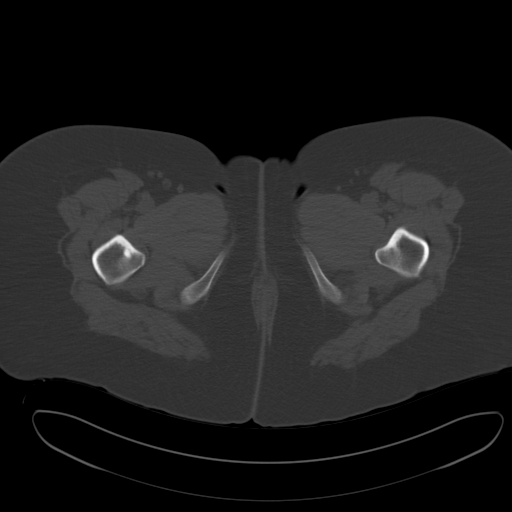
[im 12/91  soft-tissue]
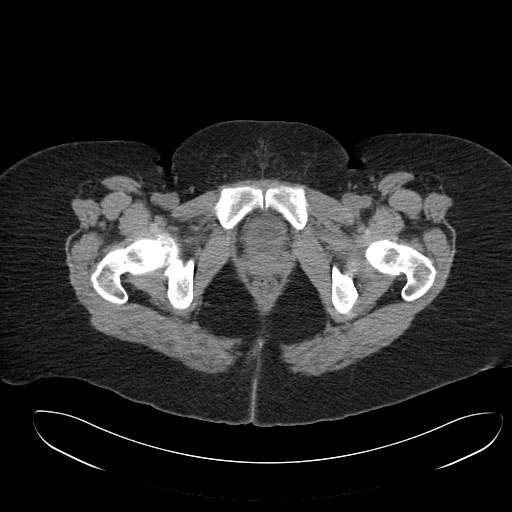
[im 19/91  soft-tissue]
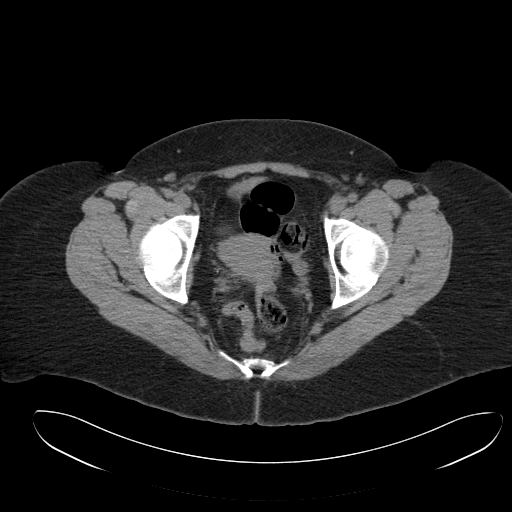
[im 27/91  soft-tissue]
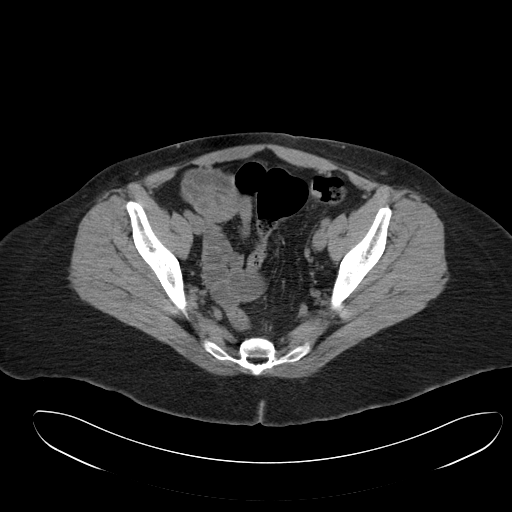
[im 31/91  soft-tissue]
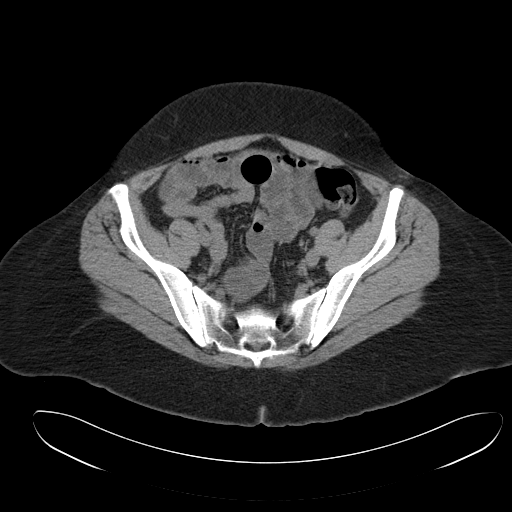
[im 38/91  soft-tissue]
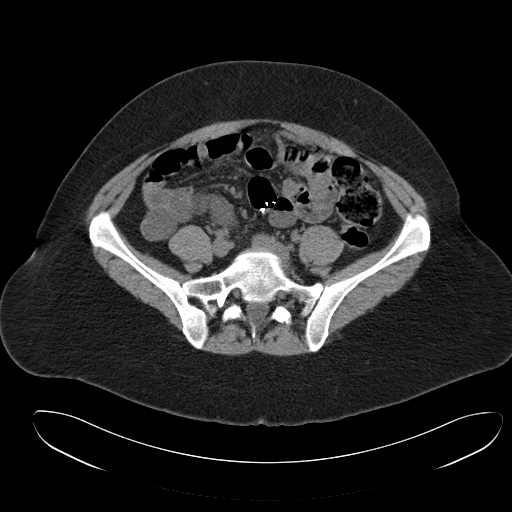
[im 46/91  soft-tissue]
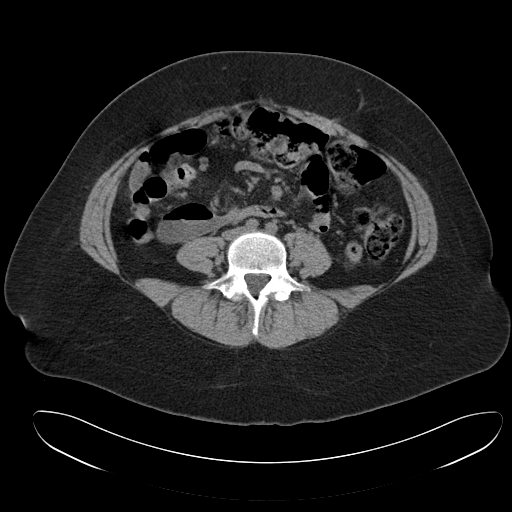
[im 53/91  soft-tissue]
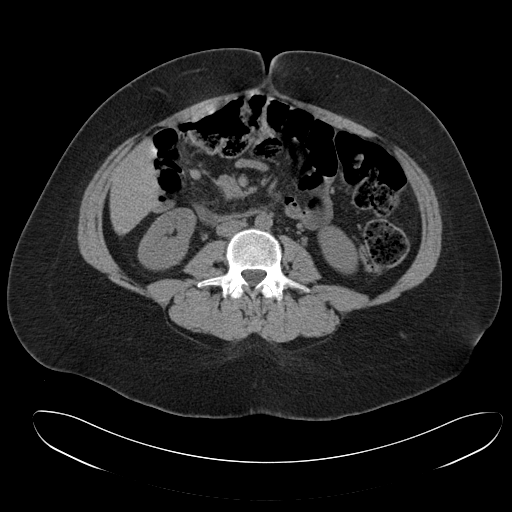
[im 61/91  soft-tissue]
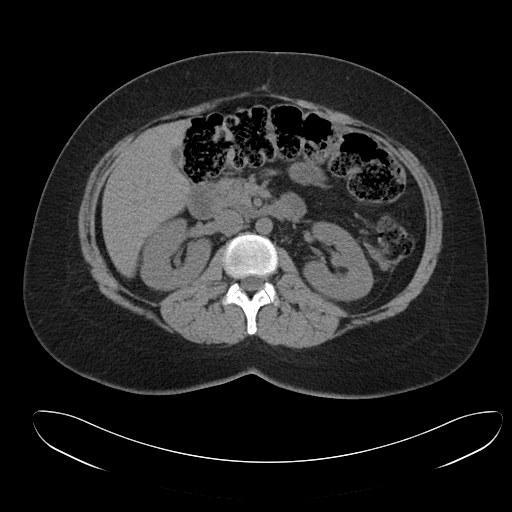
[im 61/91  bone]
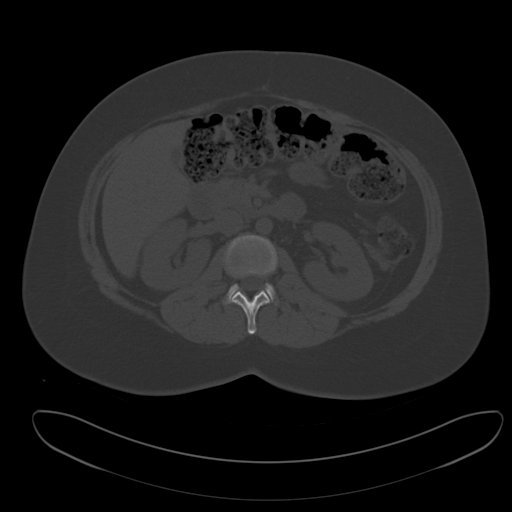
[im 64/91  soft-tissue]
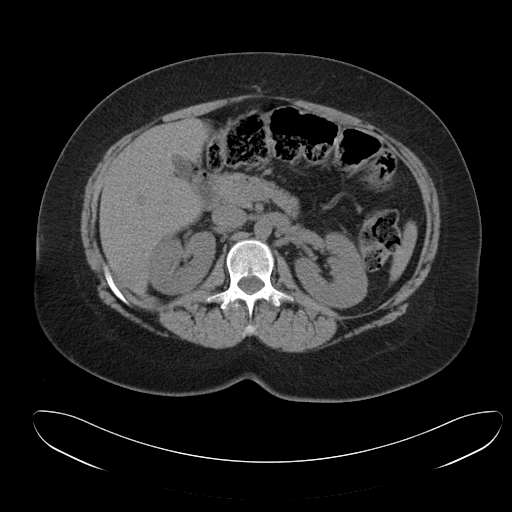
[im 72/91  soft-tissue]
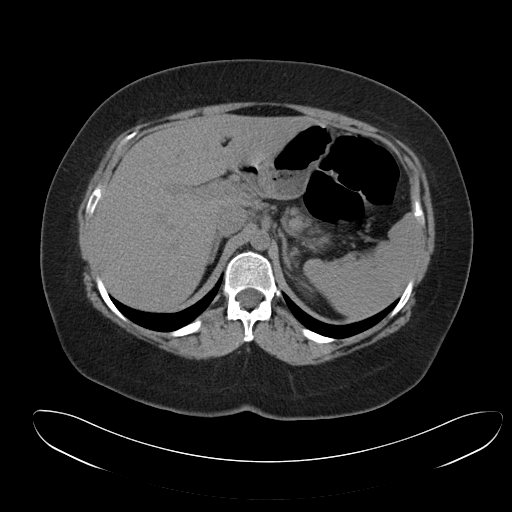
[im 79/91  soft-tissue]
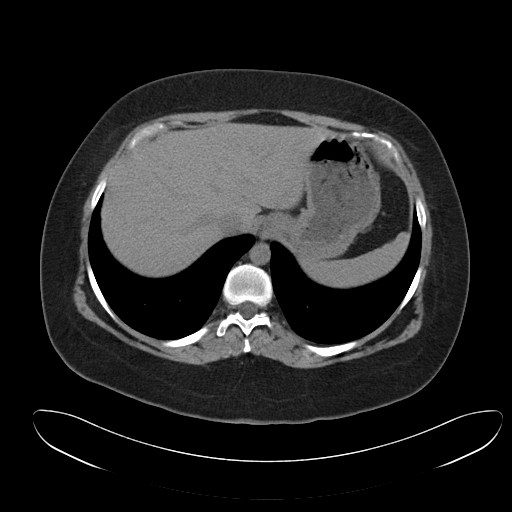
[im 87/91  soft-tissue]
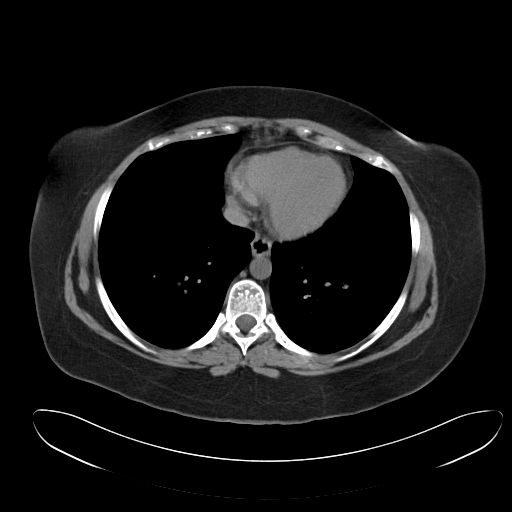

[Series 5: cor routine abd pel wo · coronal · 0.74mm/px · 3 of 150 slices shown]
[im 50/150  soft-tissue]
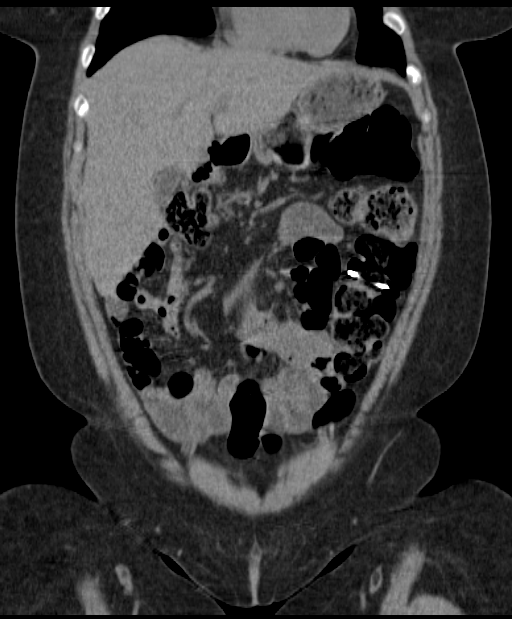
[im 67/150  soft-tissue]
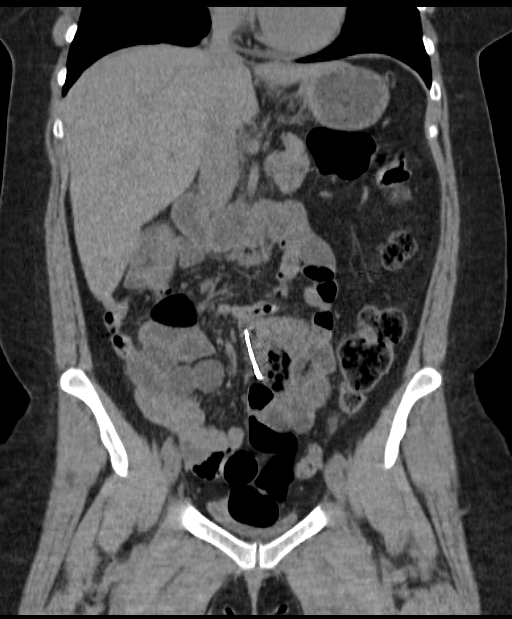
[im 83/150  soft-tissue]
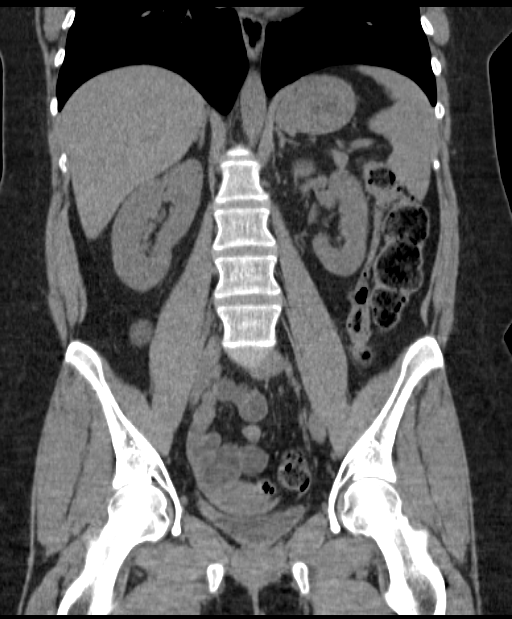

[16 of 46 positions shown; findings below may reference images not displayed]

FINDINGS: Lung bases clear. Normal heart size. No pericardial or pleural
effusion. No hiatal hernia.

Abdomen: Liver, collapsed gallbladder, biliary system, pancreas,
spleen, adrenal glands, and kidneys are within normal limits for
noncontrast imaging and demonstrate no acute process.

Radiopaque foreign bodies are noted within the colon, images 40-43
and also a second radiopaque foreign body within a loop of small
bowel, images 49 through 56. These are compatible with ingested
safety pins. Negative for obstruction, ileus, significant
dilatation, or free air.

No abdominal free fluid, fluid collection, hemorrhage, or or an
abscess.

Negative for aneurysm

Pelvis: No pelvic free fluid, fluid collection, hemorrhage, abscess,
or adenopathy. No acute distal bowel process. Urinary bladder
glands.

No acute or abnormal osseous finding.
IMPRESSION: Two radiopaque foreign bodies compatible with safety pins, 1 closed
pin within the colon and a second open pin within small bowel.

Negative for obstruction, significant dilatation, ileus, or free
air.

## 2013-07-02 LAB — COMPREHENSIVE METABOLIC PANEL
ALBUMIN: 3.2 g/dL — AB (ref 3.4–5.0)
ALT: 9 U/L — AB (ref 12–78)
Alkaline Phosphatase: 99 U/L
Anion Gap: 1 — ABNORMAL LOW (ref 7–16)
BUN: 6 mg/dL — ABNORMAL LOW (ref 7–18)
Bilirubin,Total: 0.3 mg/dL (ref 0.2–1.0)
CO2: 27 mmol/L (ref 21–32)
Calcium, Total: 9.2 mg/dL (ref 8.5–10.1)
Chloride: 113 mmol/L — ABNORMAL HIGH (ref 98–107)
Creatinine: 0.69 mg/dL (ref 0.60–1.30)
EGFR (Non-African Amer.): >60
GLUCOSE: 89 mg/dL (ref 65–99)
Osmolality: 278 (ref 275–301)
Potassium: 3.9 mmol/L (ref 3.5–5.1)
SGOT(AST): 17 U/L (ref 15–37)
SODIUM: 141 mmol/L (ref 136–145)
Total Protein: 6.5 g/dL (ref 6.4–8.2)

## 2013-07-02 LAB — PROTIME-INR
INR: 1.1
Prothrombin Time: 13.7 secs (ref 11.5–14.7)

## 2013-07-02 LAB — CBC WITH DIFFERENTIAL/PLATELET
Basophil #: 0.1 10*3/uL (ref 0.0–0.1)
Basophil %: 0.7 %
EOS ABS: 0.4 10*3/uL (ref 0.0–0.7)
EOS PCT: 5.1 %
HCT: 37.6 % (ref 35.0–47.0)
HGB: 12.5 g/dL (ref 12.0–16.0)
LYMPHS PCT: 35.5 %
Lymphocyte #: 2.5 10*3/uL (ref 1.0–3.6)
MCH: 30.2 pg (ref 26.0–34.0)
MCHC: 33.3 g/dL (ref 32.0–36.0)
MCV: 91 fL (ref 80–100)
MONO ABS: 0.5 x10 3/mm (ref 0.2–0.9)
Monocyte %: 6.4 %
Neutrophil #: 3.7 10*3/uL (ref 1.4–6.5)
Neutrophil %: 52.3 %
Platelet: 200 10*3/uL (ref 150–440)
RBC: 4.14 10*6/uL (ref 3.80–5.20)
RDW: 13.4 % (ref 11.5–14.5)
WBC: 7.1 10*3/uL (ref 3.6–11.0)

## 2013-07-02 LAB — MAGNESIUM: MAGNESIUM: 1.9 mg/dL

## 2013-07-02 LAB — APTT: Activated PTT: 28.9 secs (ref 23.6–35.9)

## 2013-07-02 LAB — LIPASE, BLOOD: Lipase: 77 U/L (ref 73–393)

## 2013-07-02 LAB — AMYLASE: Amylase: 31 U/L (ref 25–115)

## 2013-07-02 IMAGING — CR DG ABDOMEN 3V
1 series · 4 of 4 positions shown · non-contrast
Comparison: [DATE]

CLINICAL DATA: Swallowed safety pins

EXAM:
ABDOMEN SERIES

[Series 1: w abdomen upright · 0.14mm/px · 4 of 4 slices shown]
[im 1/4]
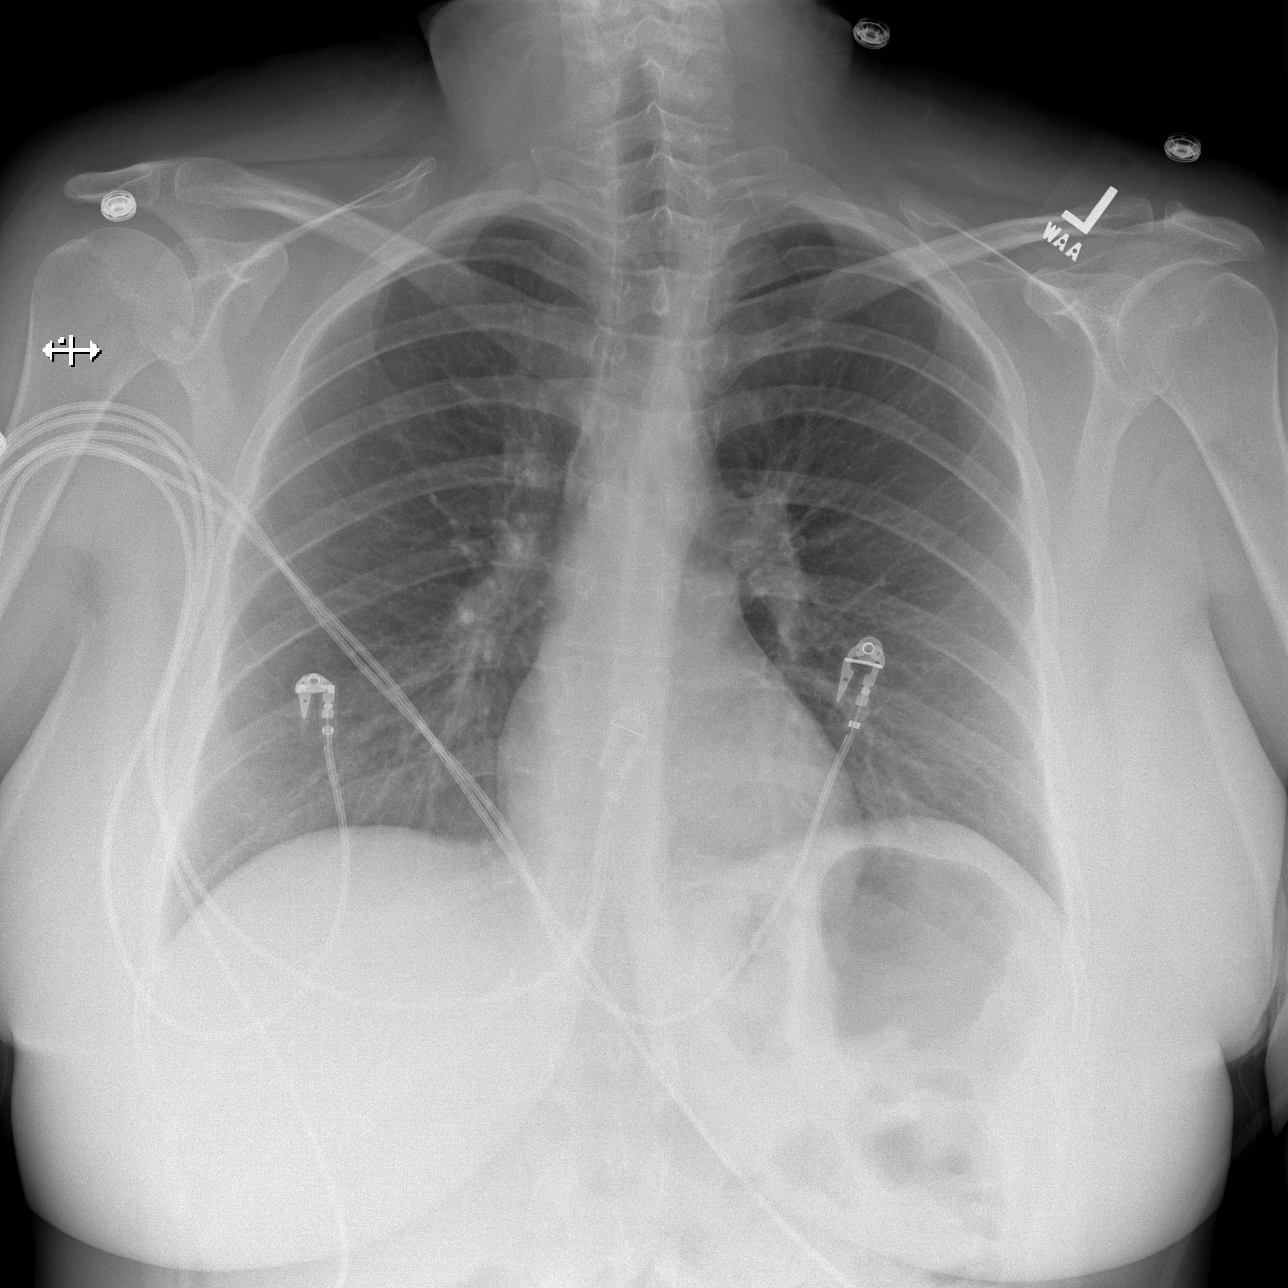
[im 2/4]
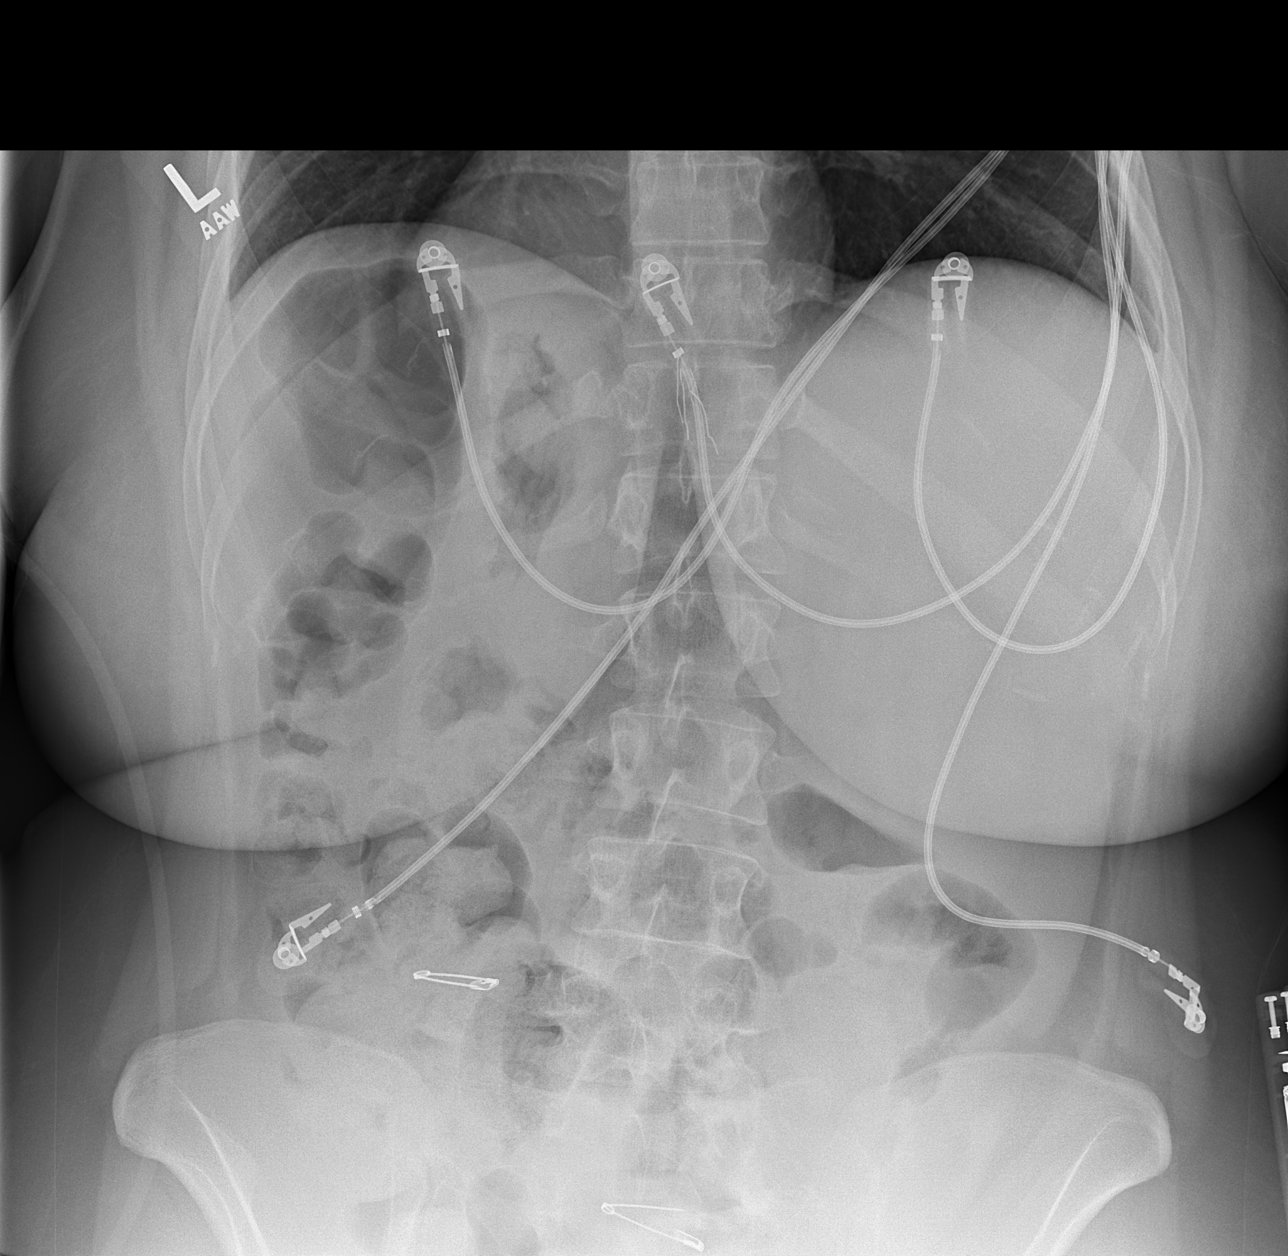
[im 3/4]
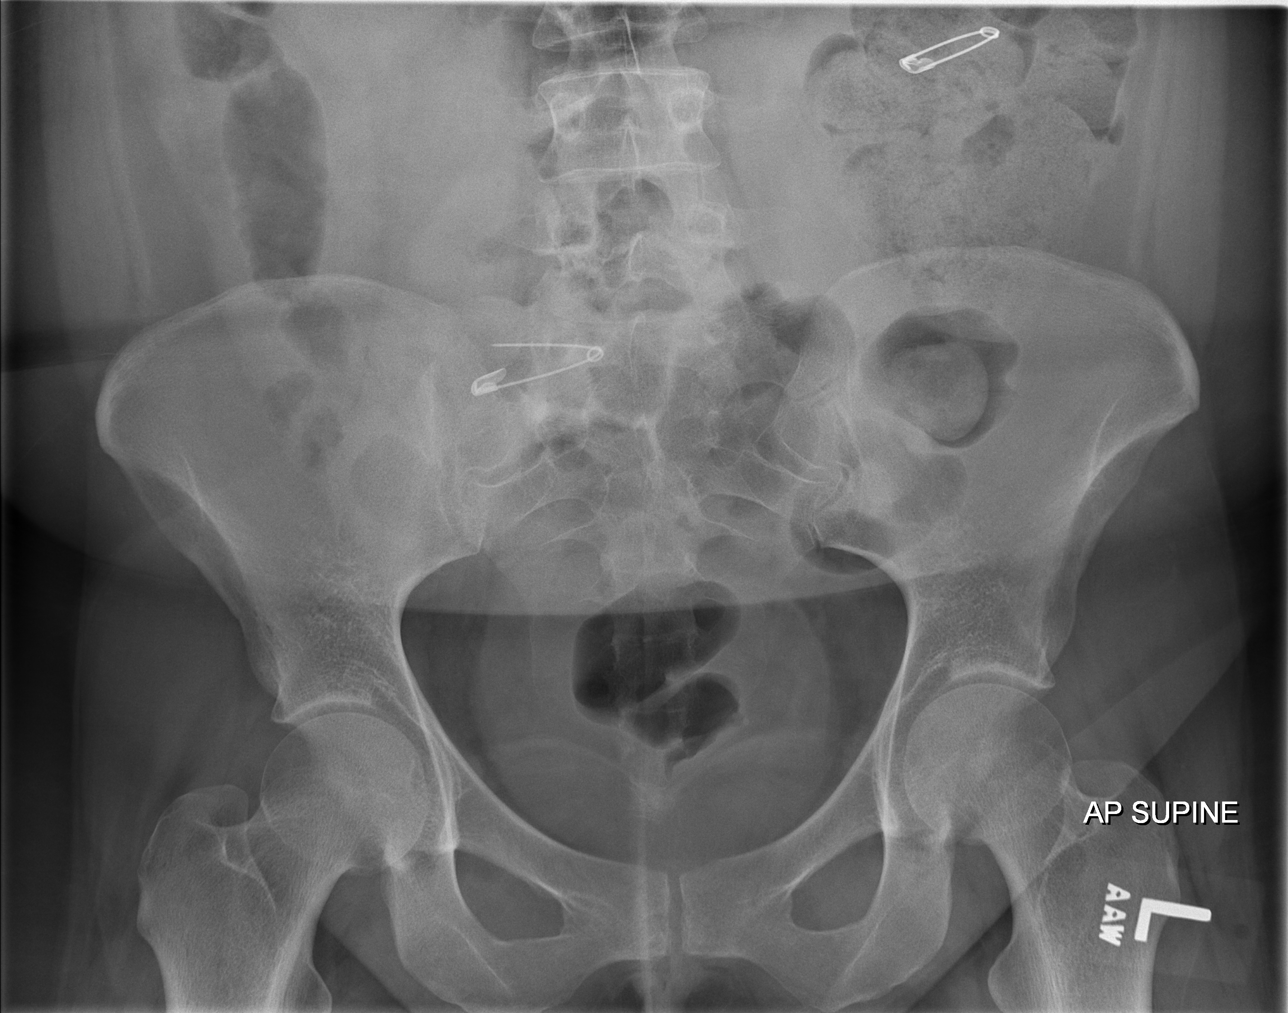
[im 4/4]
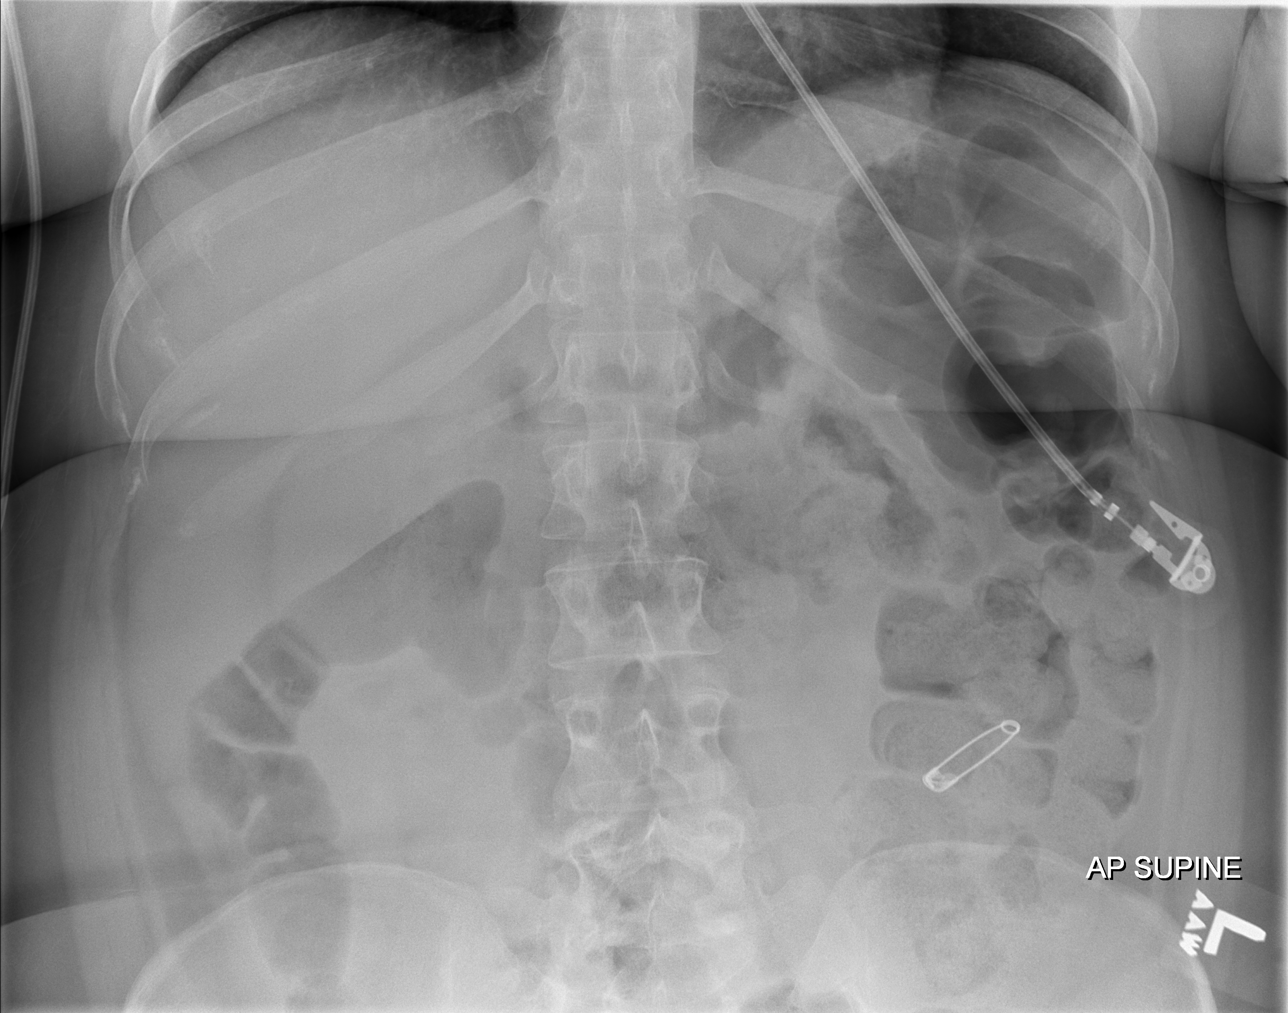

[4 of 4 positions shown; findings below may reference images not displayed]

FINDINGS: Two safety pins are present within bowel. There is a Closed safety
pin overlying the left colon which is filled with stool. There is a
second open safety pin projecting over the right sacrum which may be
within the ileum.

Negative for bowel obstruction.  Negative for intraperitoneal air.

The lungs are clear.
IMPRESSION: Closed safety pin overlying the left colon.

Open safety pin overlying the ileum.

Negative for bowel obstruction or free air.

## 2013-07-03 IMAGING — CR DG ABDOMEN 2V
1 series · 2 of 2 positions shown · non-contrast
Comparison: [DATE]

CLINICAL DATA: Foreign bodies

EXAM:
ABDOMEN - 2 VIEW

[Series 5: w abdomen upright · 0.14mm/px · 2 of 2 slices shown]
[im 1/2]
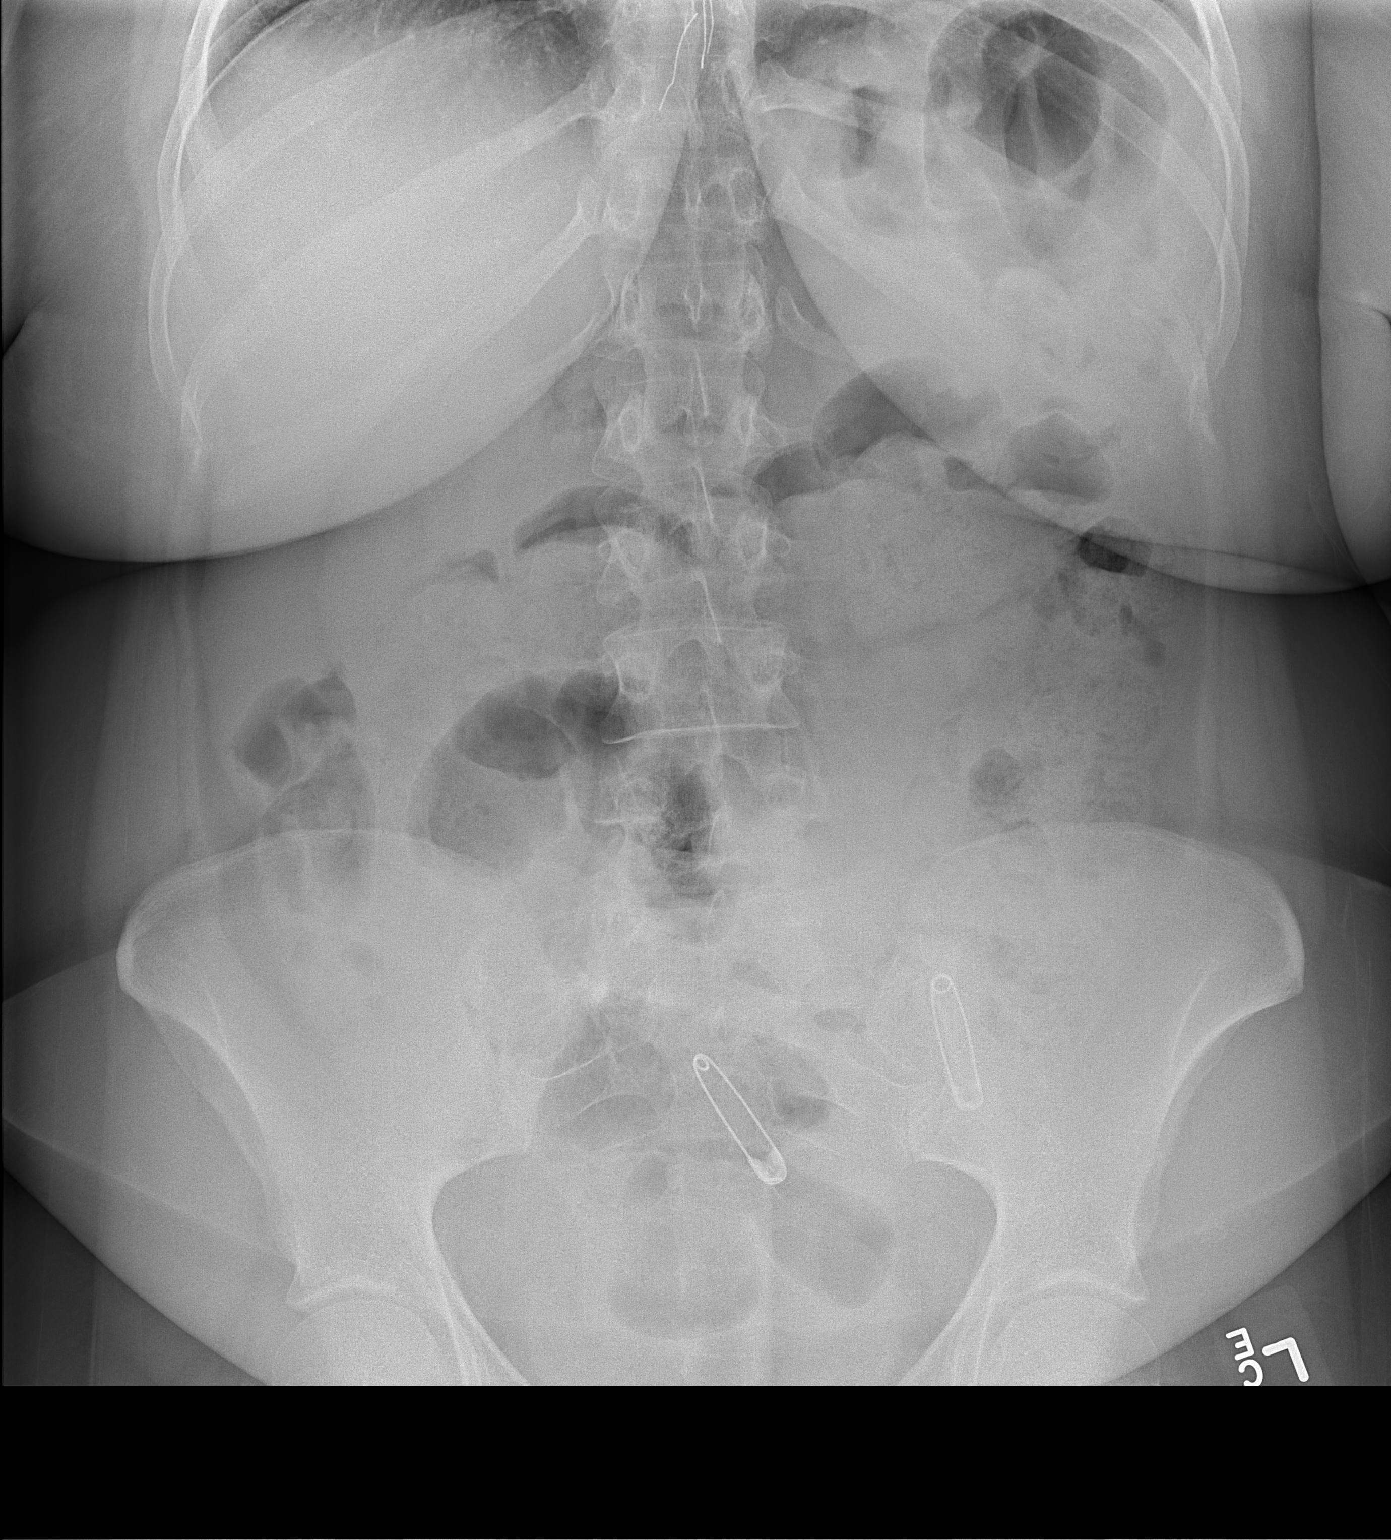
[im 2/2]
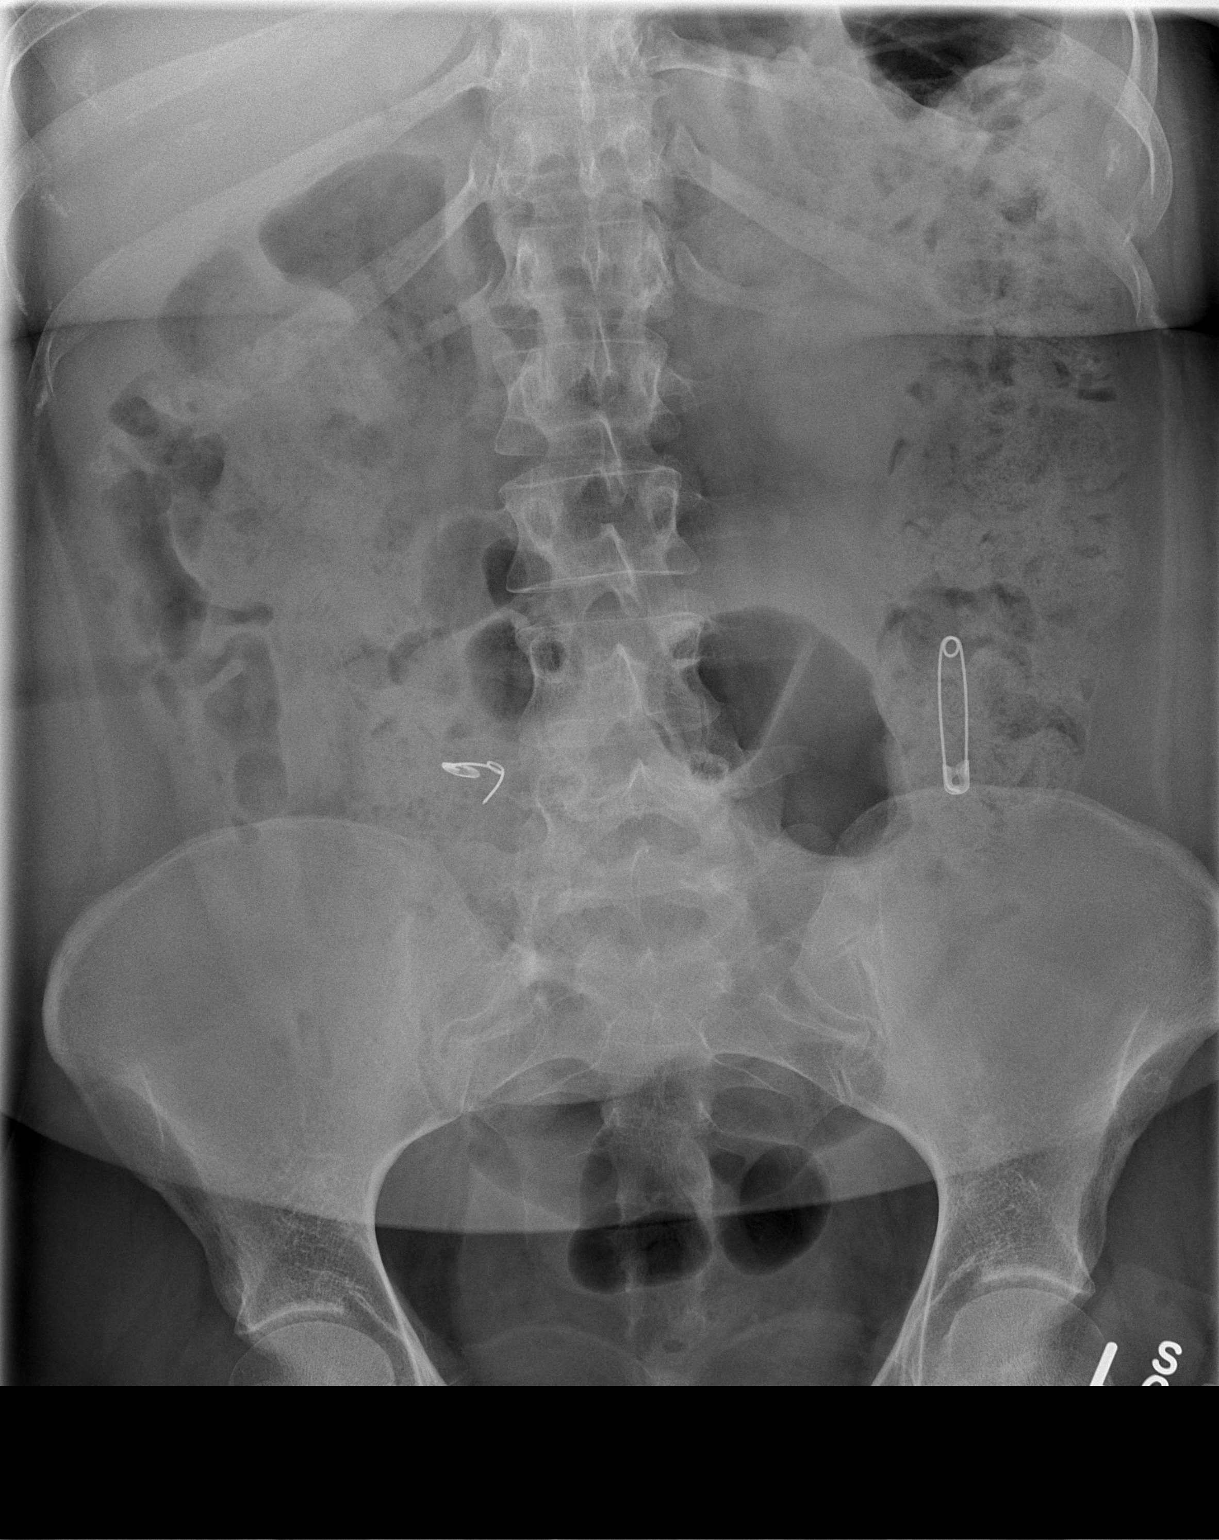

[2 of 2 positions shown; findings below may reference images not displayed]

FINDINGS: Supine and upright images were obtained. The open safety pin in the
right lower abdomen is near the ileocecal junction, not
significantly changed from 1 day prior. A closed safety pin is noted
in the distal transverse colon region, essentially stable. There is
stool throughout the colon. The bowel gas pattern is unremarkable.
No obstruction seen. No free air is currently demonstrated.
IMPRESSION: The safety pins show very little change in position compared to 1
day prior. Note that the safety pin near the ileocecal junction on
the right is open. No free air seen. Moderate diffuse stool
throughout colon again noted.

## 2013-07-04 IMAGING — CR DG ABDOMEN 2V
1 series · 3 of 3 positions shown · non-contrast
Comparison: [DATE]

CLINICAL DATA: Swallowed safety pins+

EXAM:
ABDOMEN - 2 VIEW

[Series 1: erect ap · 0.17mm/px · 3 of 3 slices shown]
[im 1/3]
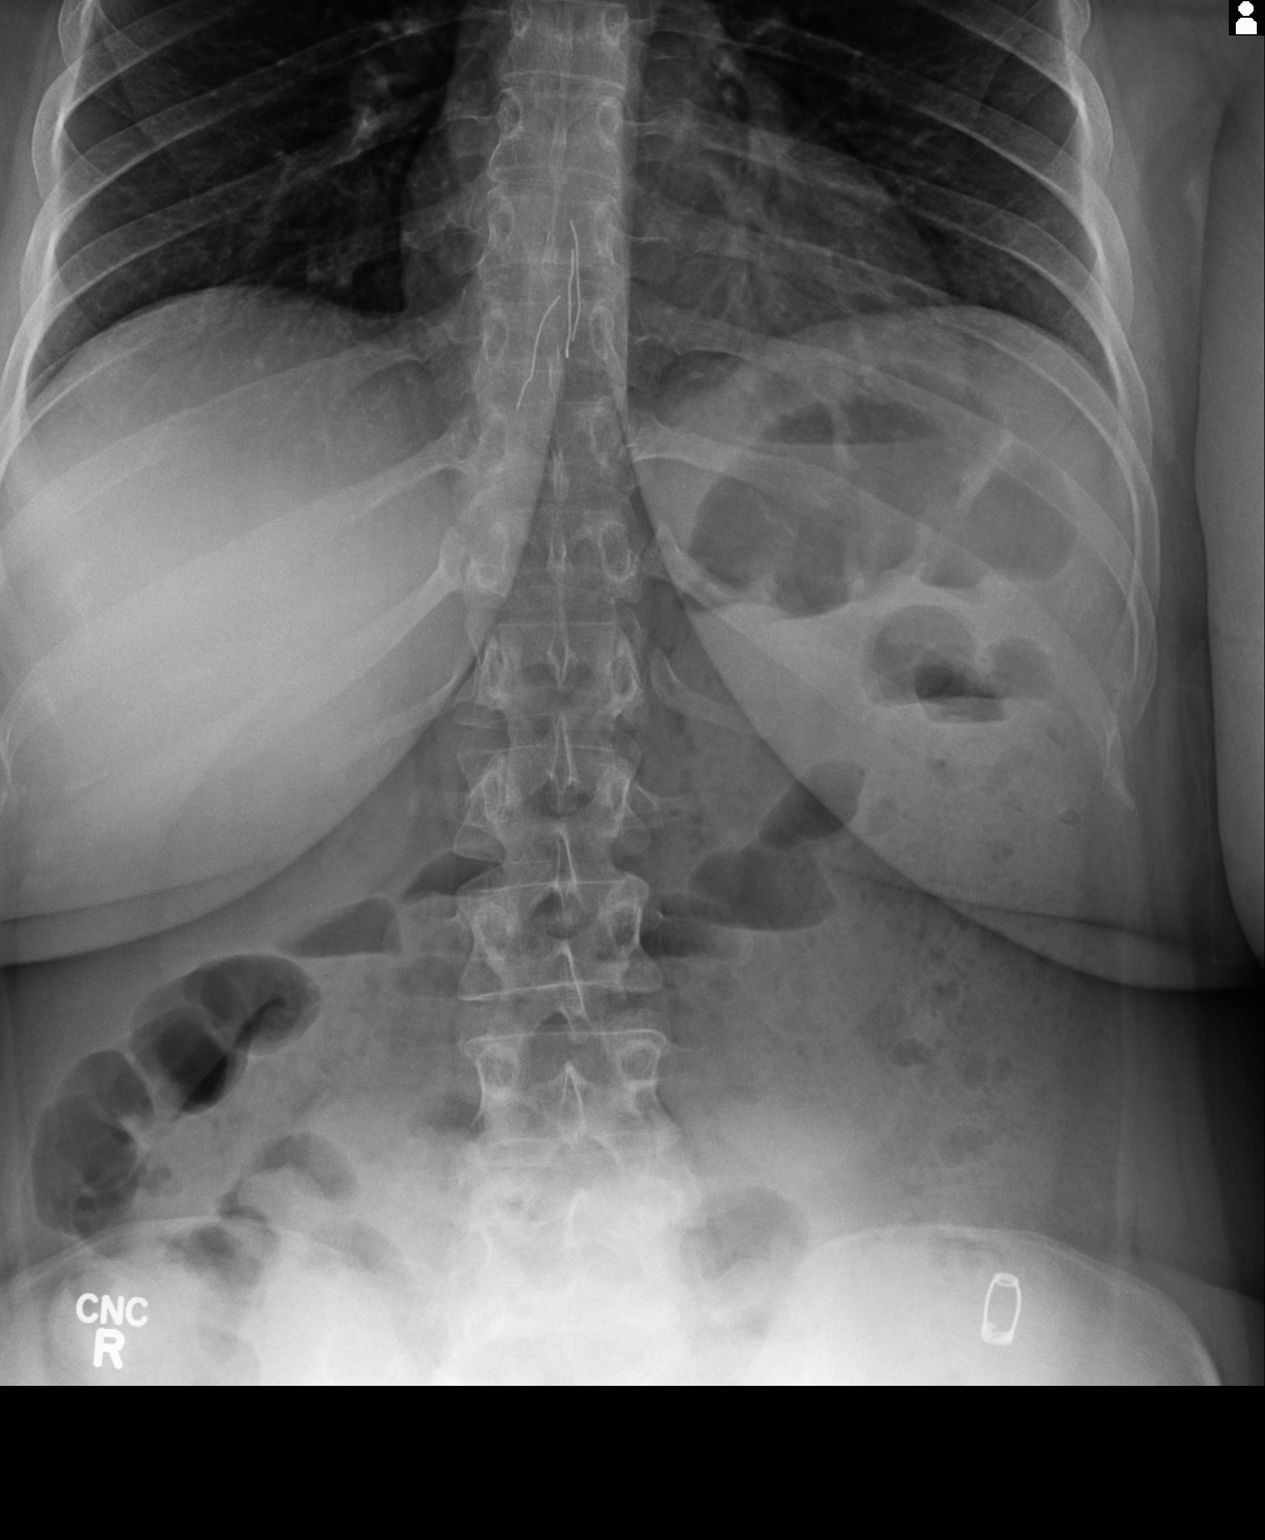
[im 2/3]
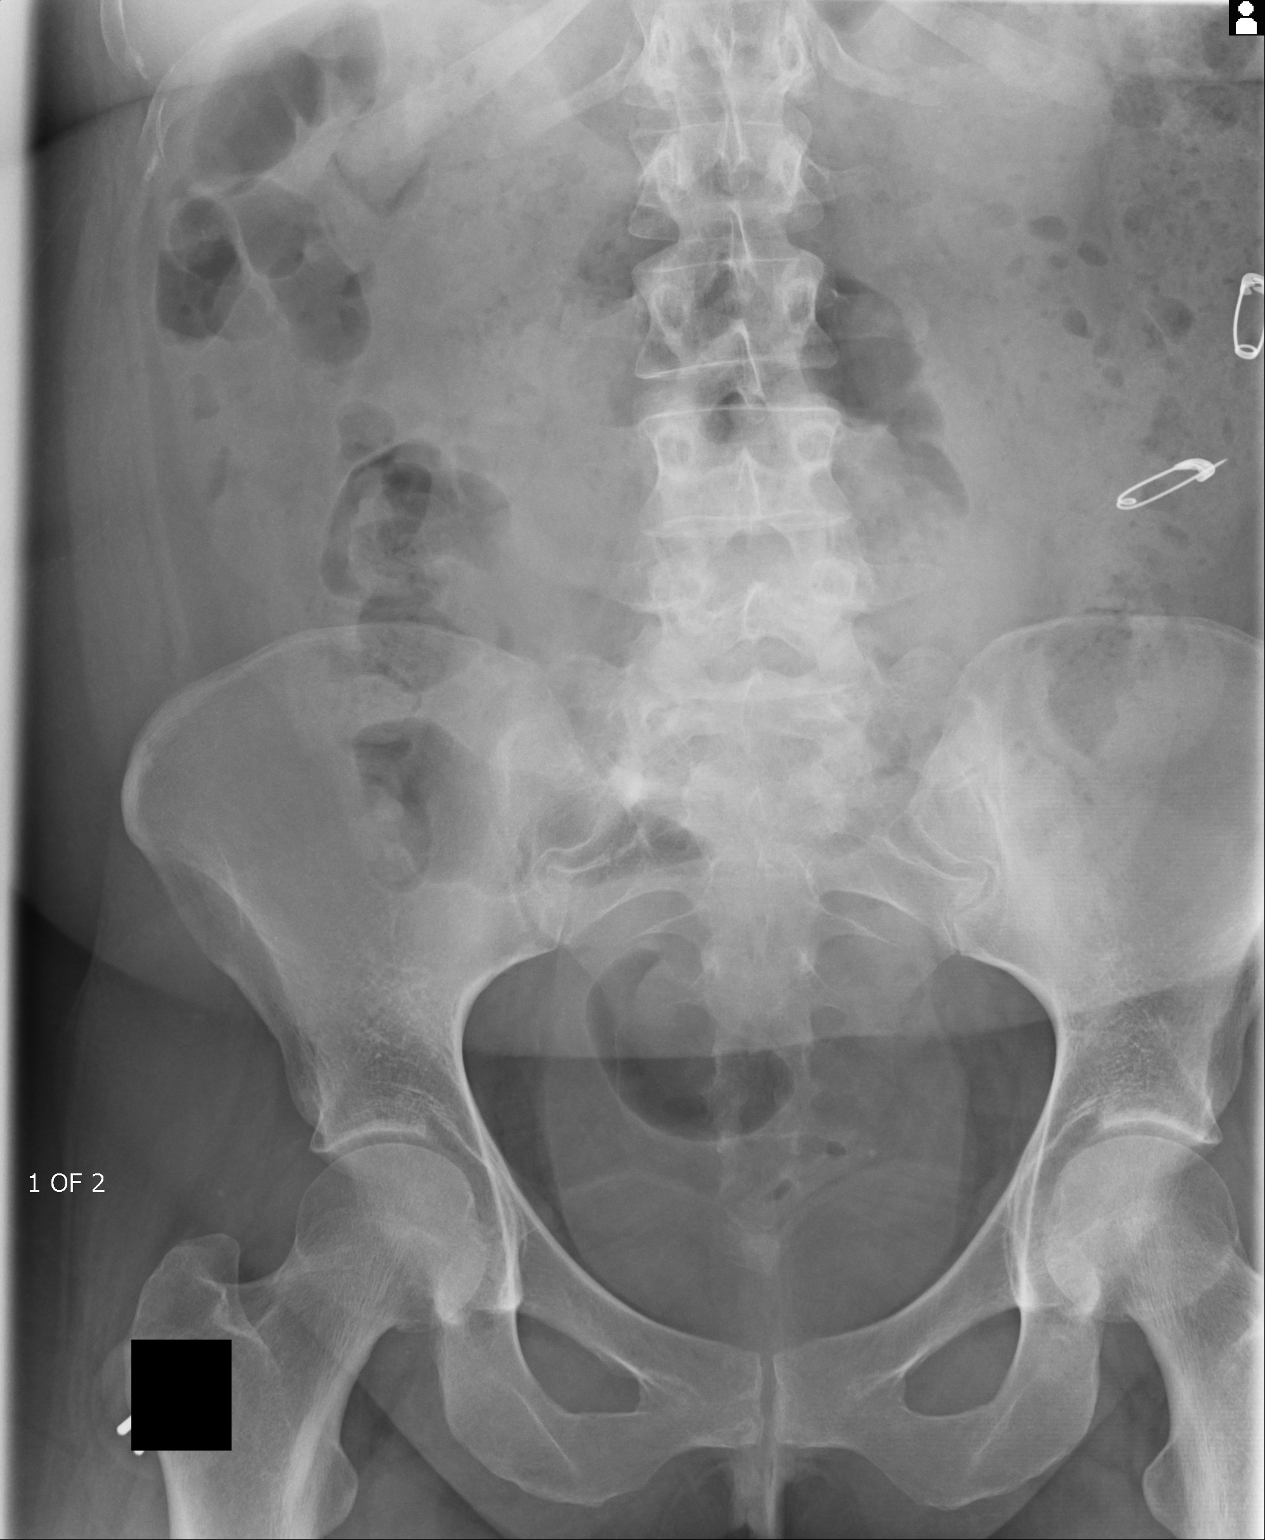
[im 3/3]
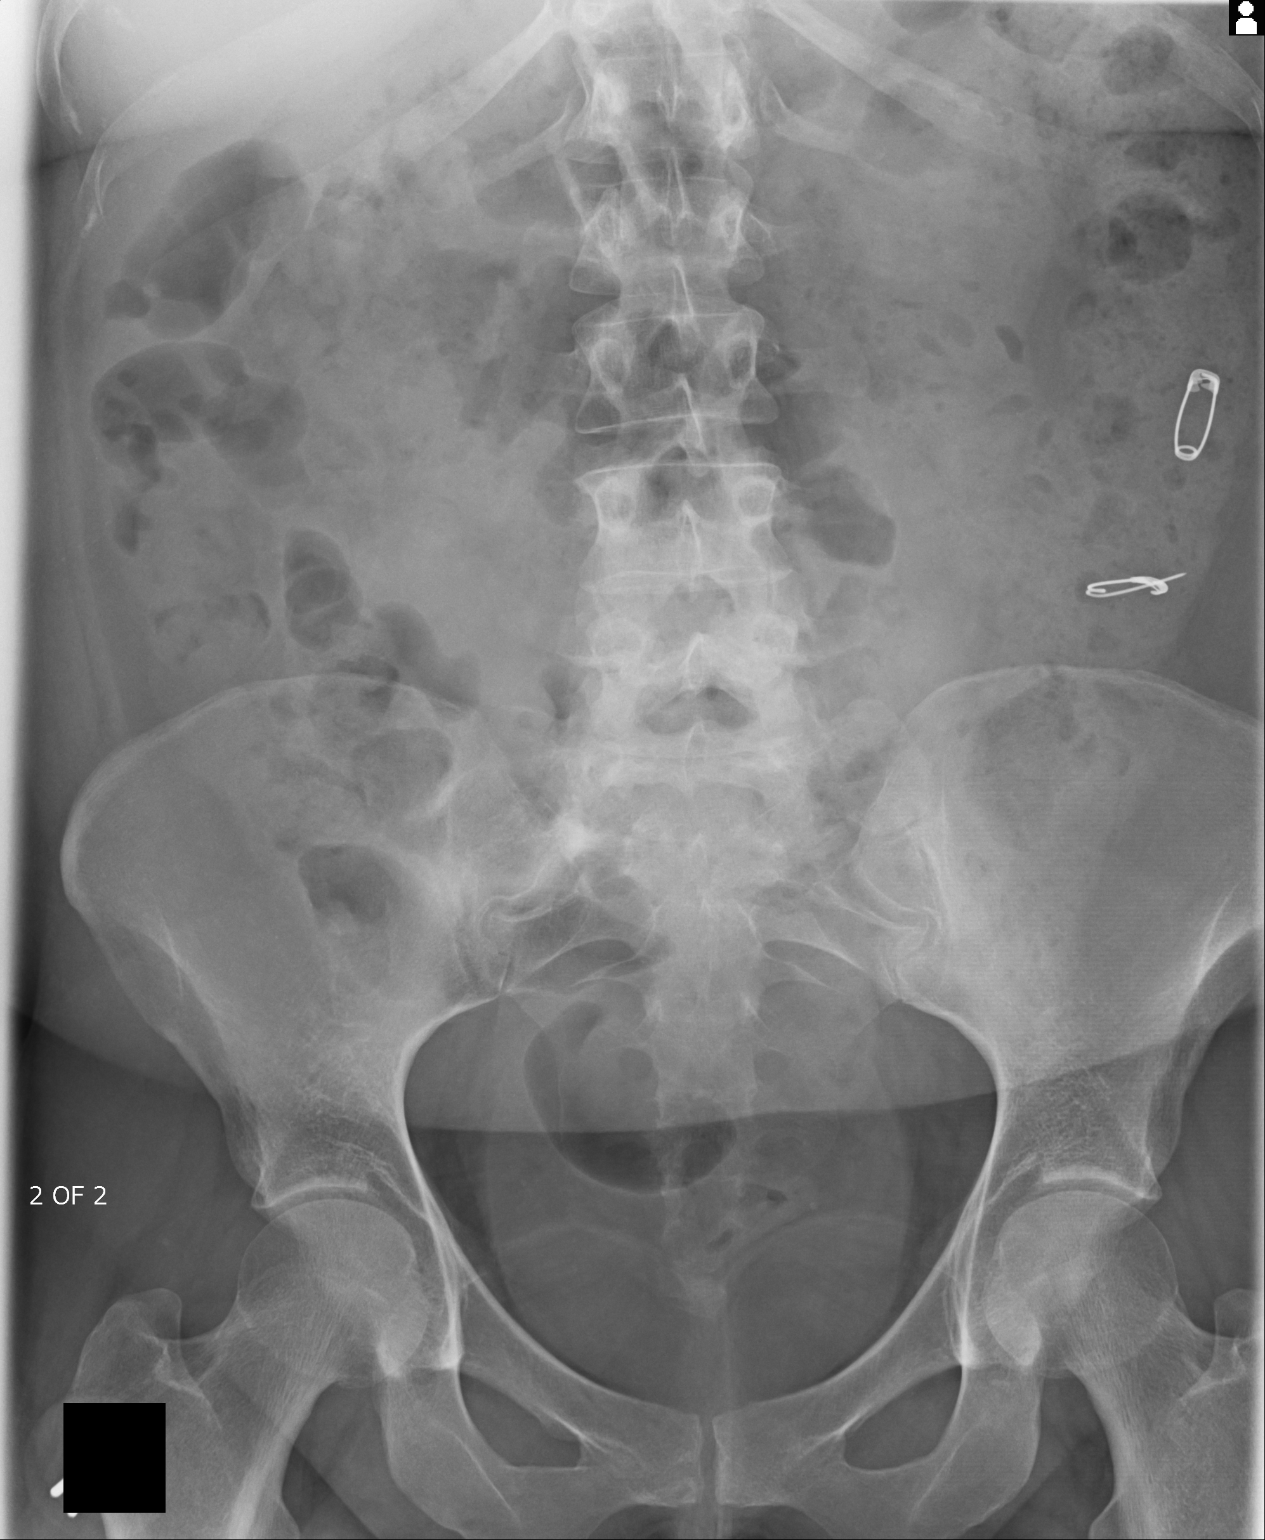

[3 of 3 positions shown; findings below may reference images not displayed]

FINDINGS: Both the closed safety pin and the open safety pin are in the mid
descending colon currently. No free air is seen. Bowel gas pattern
is unremarkable. There is diffuse stool throughout colon.
IMPRESSION: Both the open safety pin and the closed safety pin are currently in
the mid descending colon. No free air.

## 2013-07-05 IMAGING — CR DG ABDOMEN 2V
1 series · 2 of 2 positions shown · non-contrast
Comparison: [DATE].

CLINICAL DATA: Foreign bodies.

EXAM:
ABDOMEN - 2 VIEW

[Series 1: erect ap · 0.17mm/px · 2 of 2 slices shown]
[im 1/2]
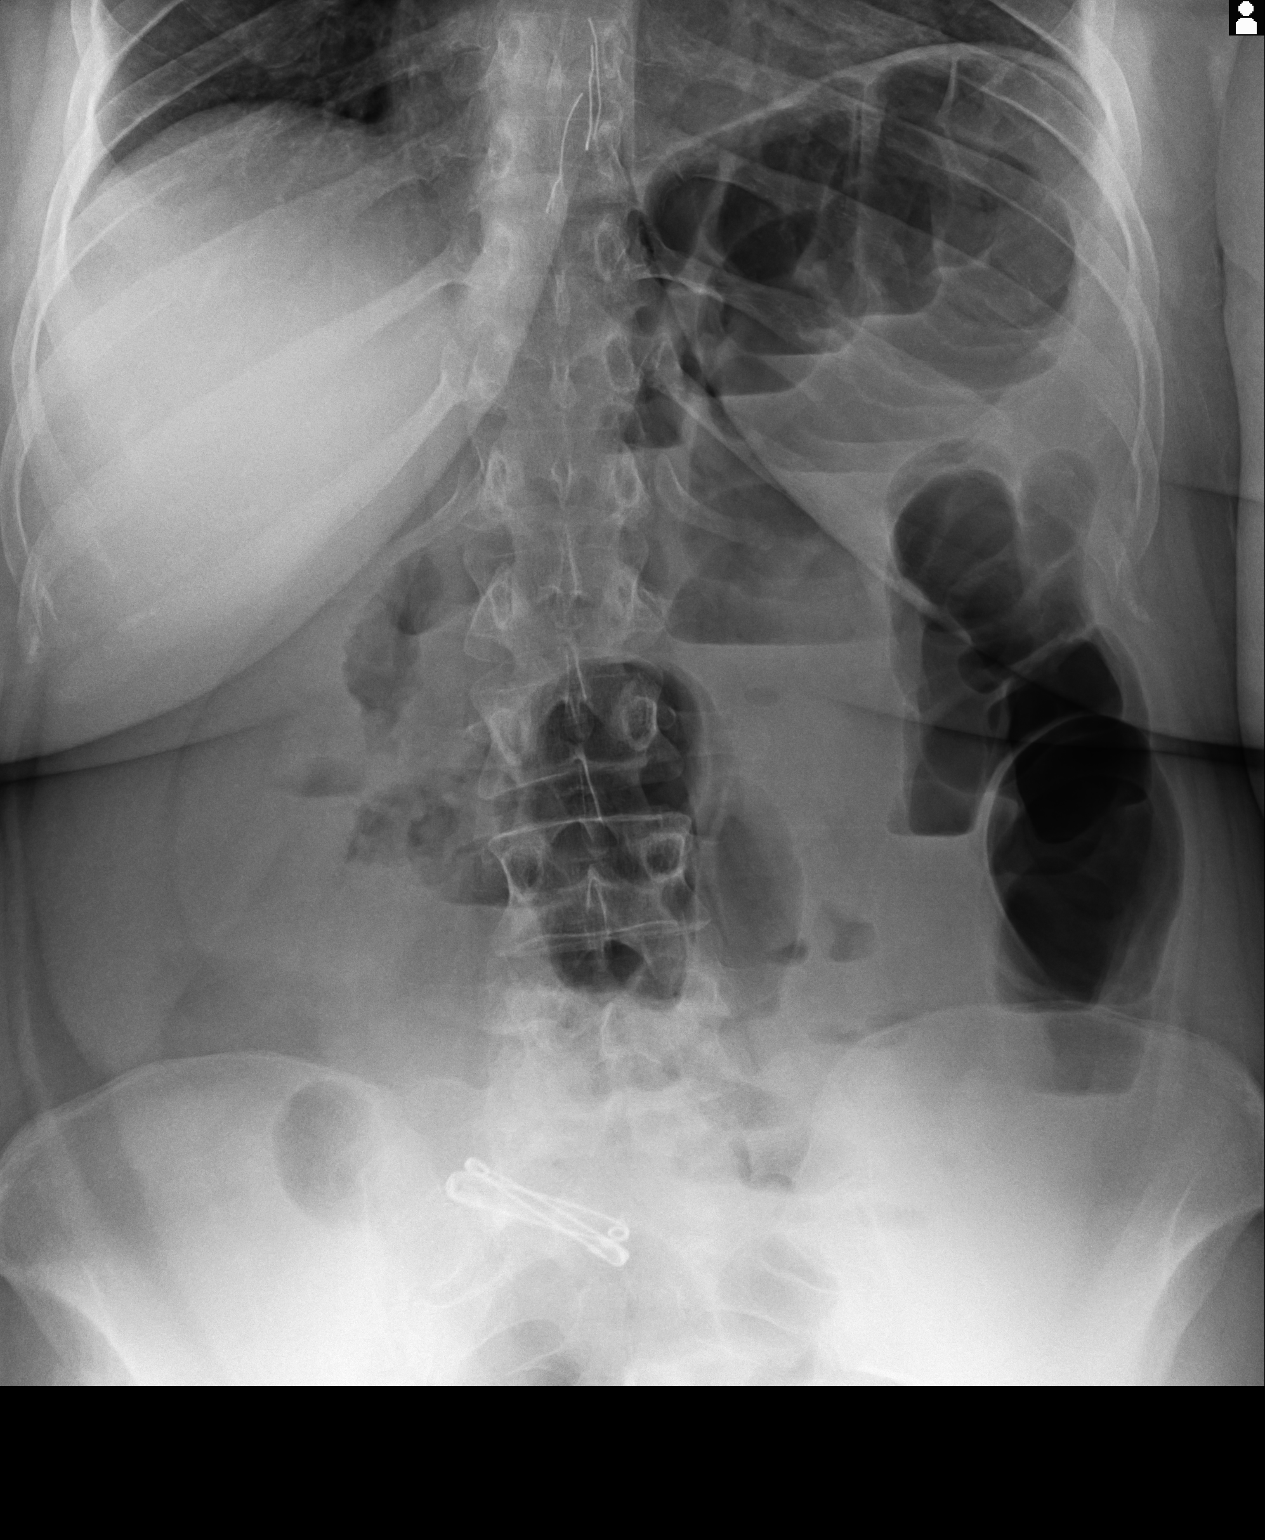
[im 2/2]
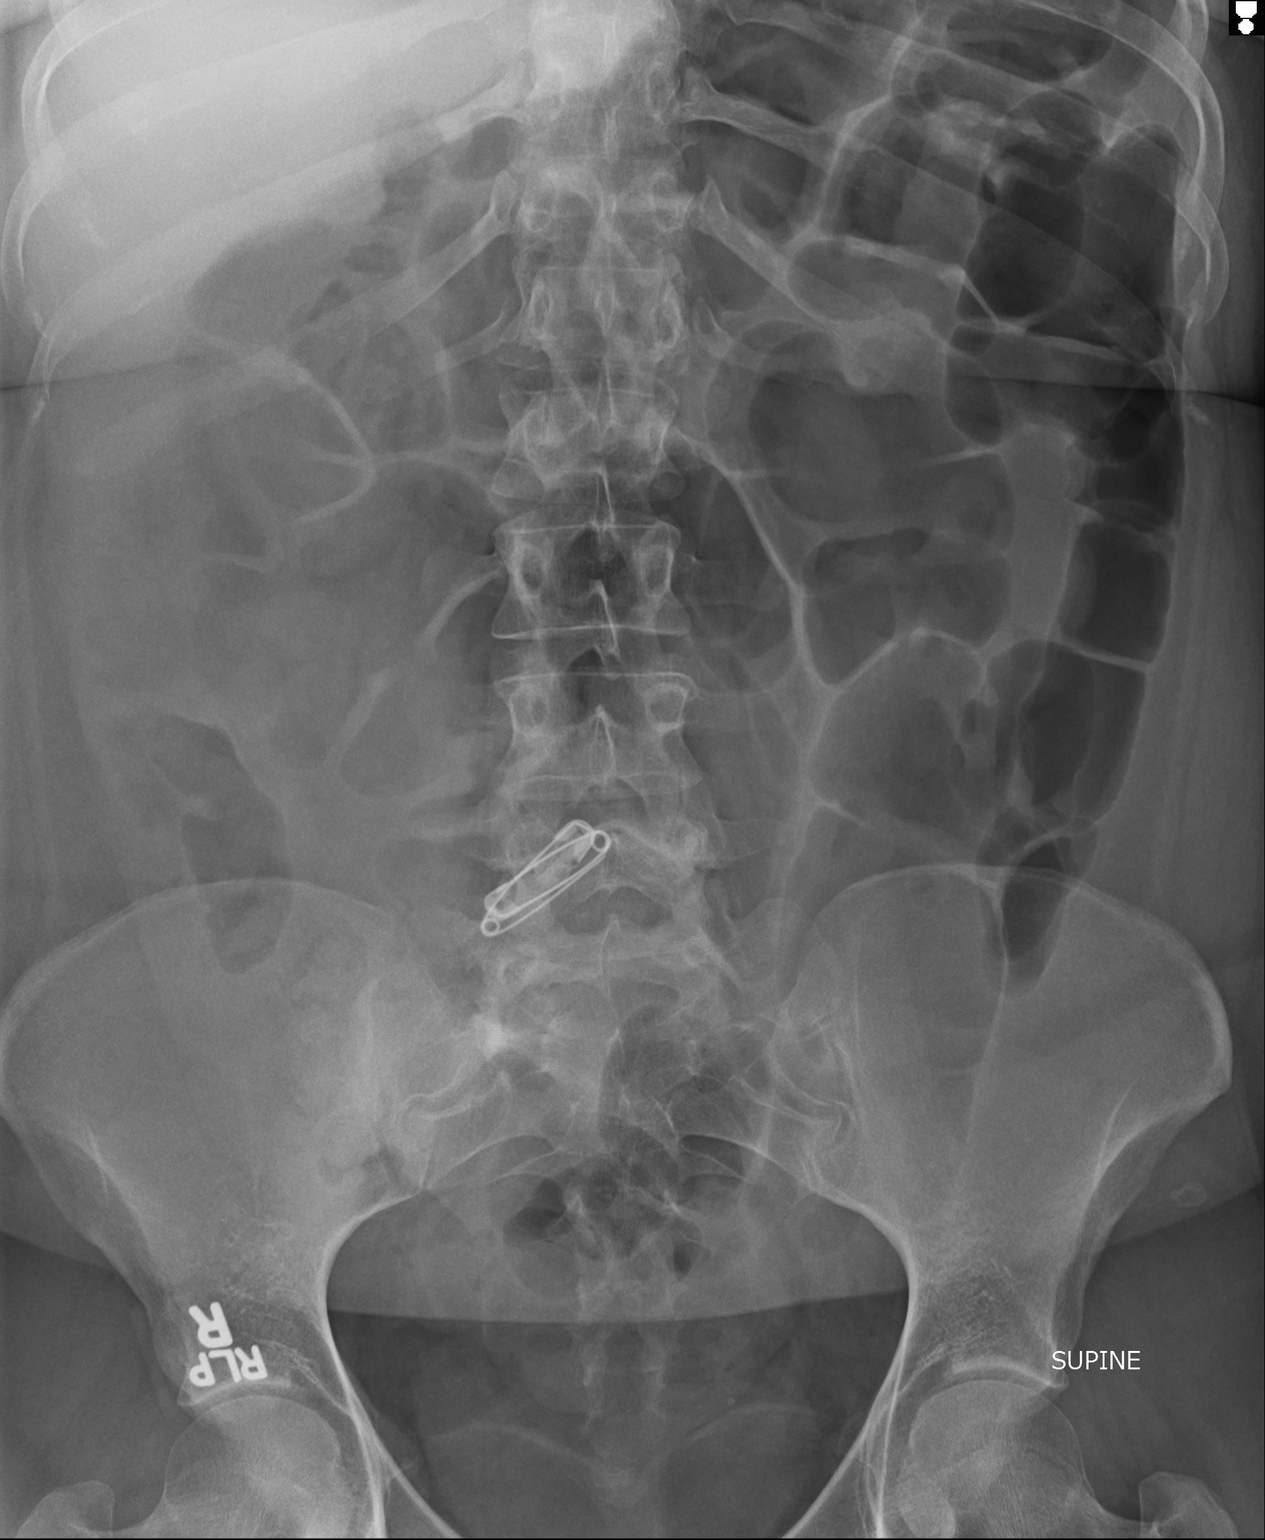

[2 of 2 positions shown; findings below may reference images not displayed]

FINDINGS: Both ingested safety pins, one opened and the other closed, have
progressed to the expected position of the sigmoid colon. No
definite pneumoperitoneum is seen at this time. Air-filled colon is
noted. Three linear metallic densities are seen projected over the
lower thoracic spine in the region of the distal esophagus which
were present on prior exam; potentially these may represent ingested
abnormalities is well.
IMPRESSION: Ingested safety pins are now all seen in the expected position of
the sigmoid colon. Three linear metallic densities are seen
projected over the lower thoracic spine which were present on prior
exam and potentially may represent other ingested foreign bodies.

## 2013-07-06 IMAGING — CT CT ABD-PELV W/O CM
2 of 4 series · 17 of 46 positions shown, 19 images · non-contrast
Comparison: Abdominal radiographs on [DATE] and CT on
[DATE]

CLINICAL DATA: Swallowed safety pins.

EXAM:
CT ABDOMEN AND PELVIS WITHOUT CONTRAST
TECHNIQUE: Multidetector CT imaging of the abdomen and pelvis was performed
following the standard protocol without intravenous contrast.

[Series 2: routine abd pel without · axial · non-contrast · 0.85mm/px · z∈[-1084,-644]mm · 14 of 96 slices shown, 16 images]
[im 4/96  soft-tissue]
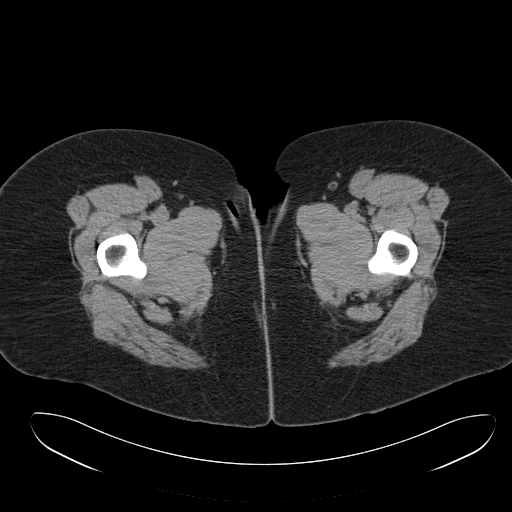
[im 4/96  bone]
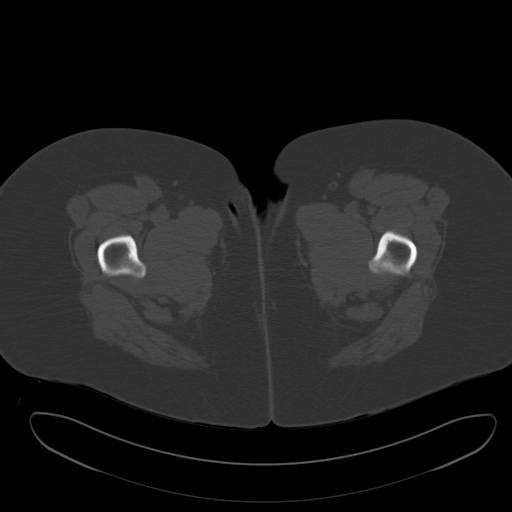
[im 12/96  soft-tissue]
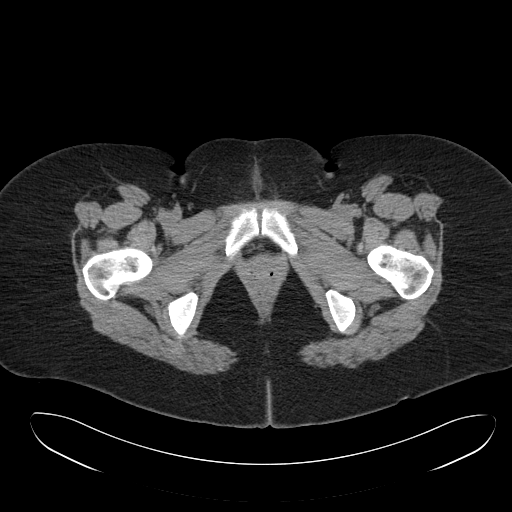
[im 20/96  soft-tissue]
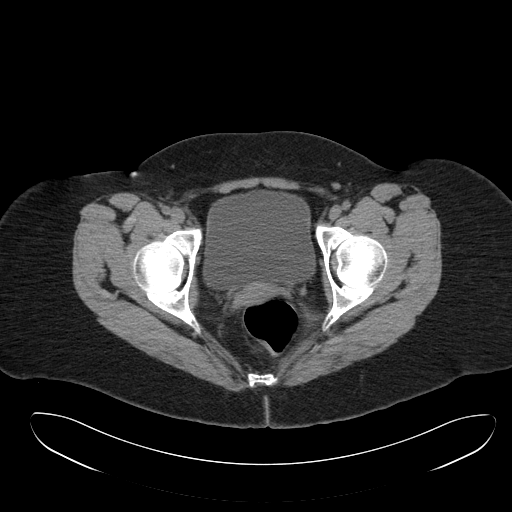
[im 27/96  soft-tissue]
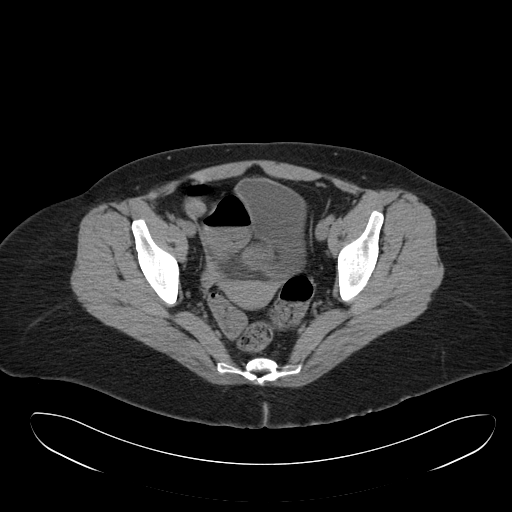
[im 31/96  soft-tissue]
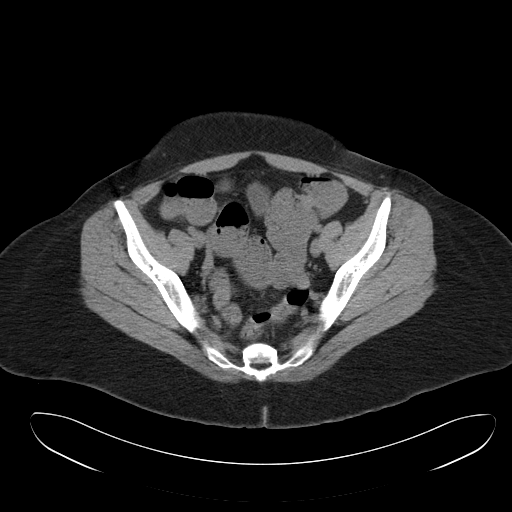
[im 39/96  soft-tissue]
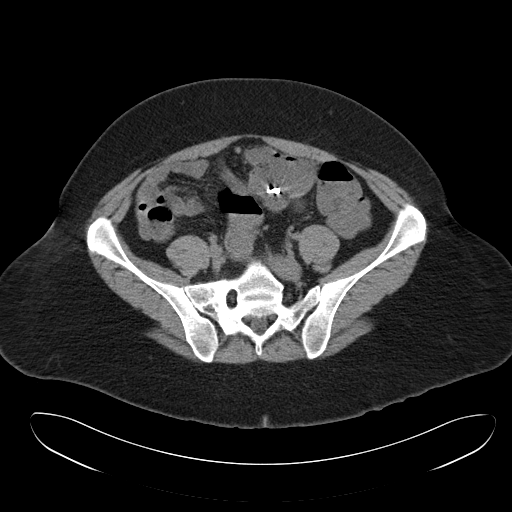
[im 46/96  soft-tissue]
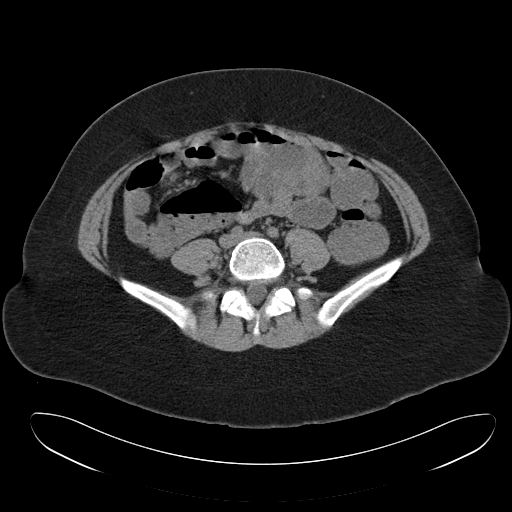
[im 50/96  soft-tissue]
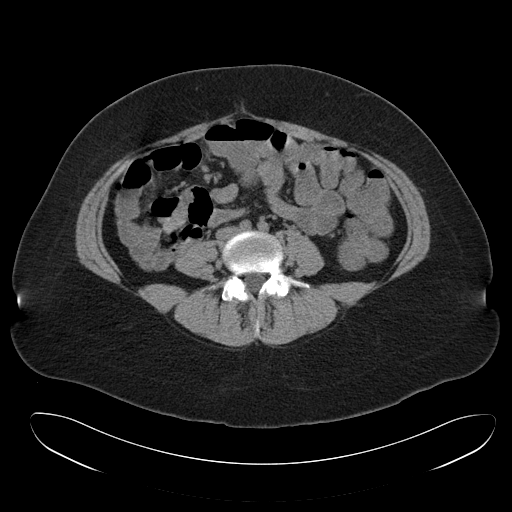
[im 58/96  soft-tissue]
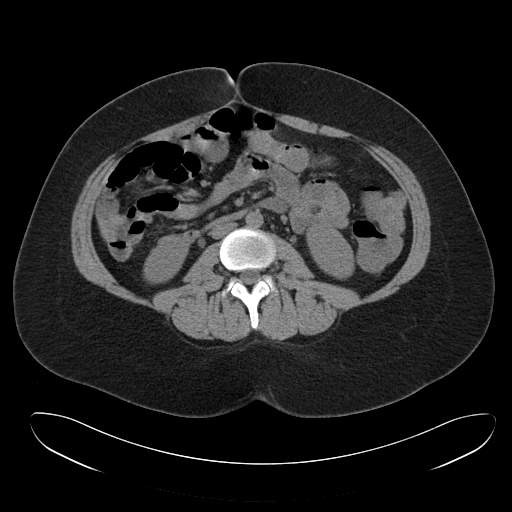
[im 58/96  bone]
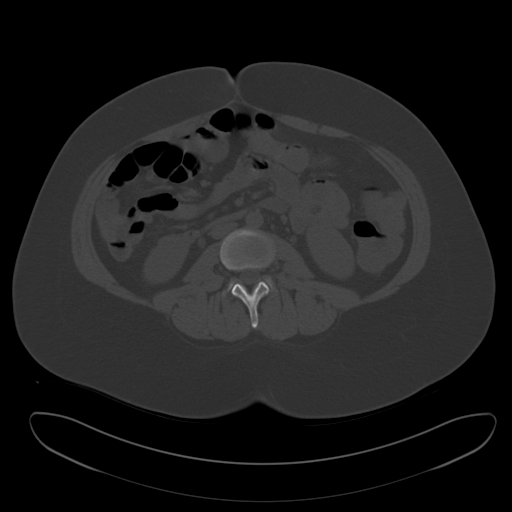
[im 65/96  soft-tissue]
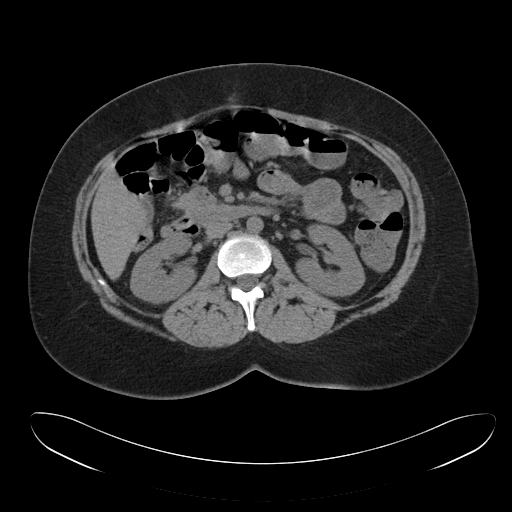
[im 73/96  soft-tissue]
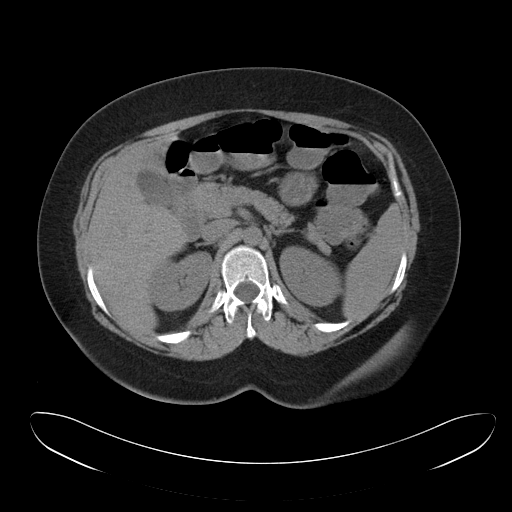
[im 77/96  soft-tissue]
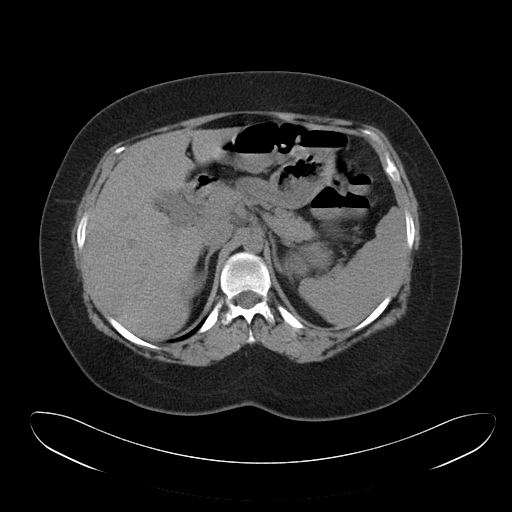
[im 84/96  soft-tissue]
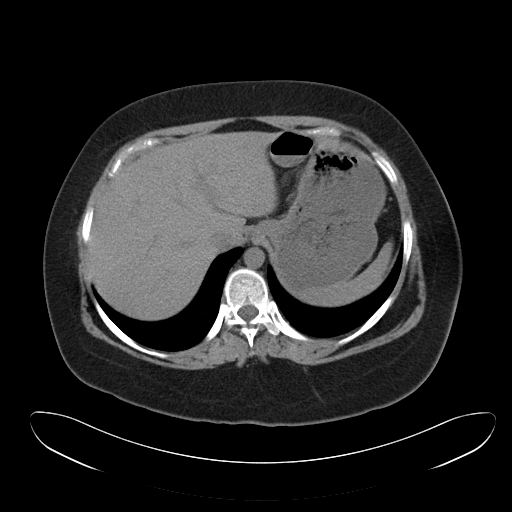
[im 92/96  soft-tissue]
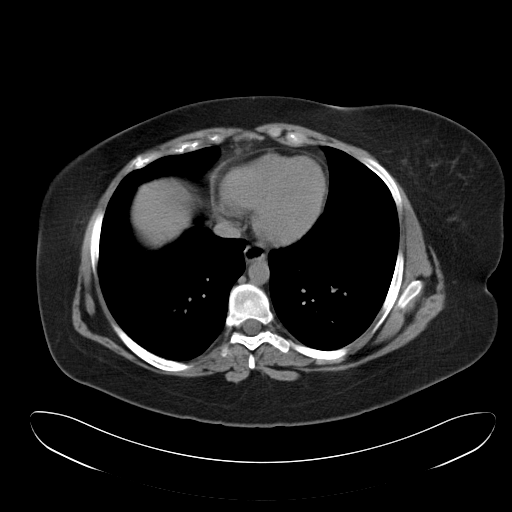

[Series 5: cor routine abd pel wo · coronal · 0.83mm/px · 3 of 161 slices shown]
[im 54/161  soft-tissue]
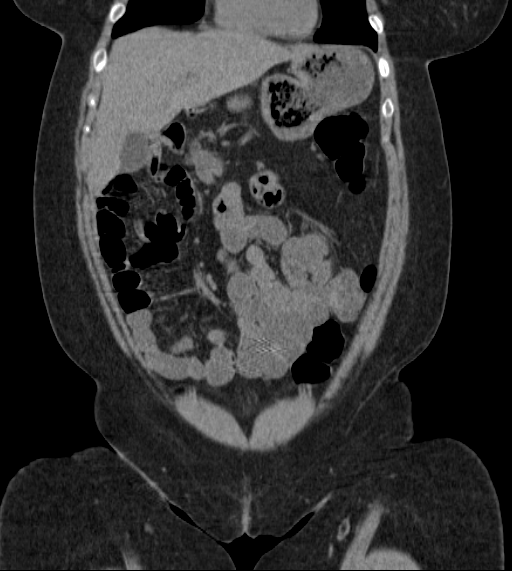
[im 72/161  soft-tissue]
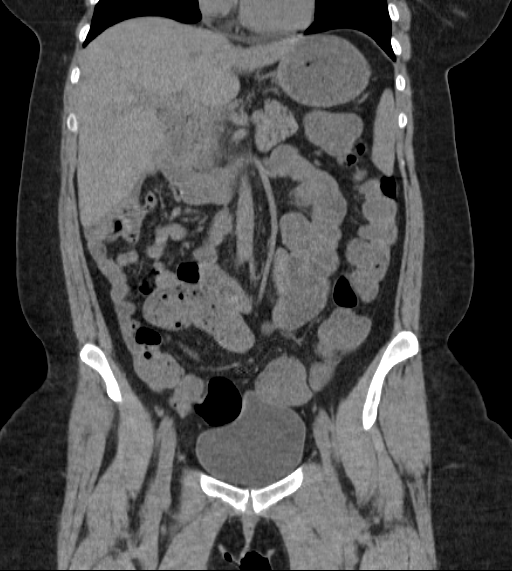
[im 89/161  soft-tissue]
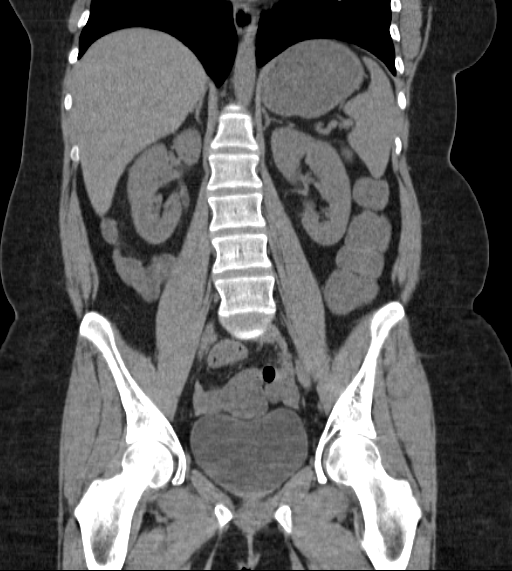

[17 of 46 positions shown; findings below may reference images not displayed]

FINDINGS: Two radiopaque foreign bodies consistent with safety pins show some
further migration distally into mid pelvic small bowel loops. There
is no evidence of small bowel obstruction or free intraperitoneal
air. No evidence of free fluid or abscess.

The abdominal parenchymal organs remain normal in appearance on this
noncontrast study. No evidence hydronephrosis. Uterus and adnexal
regions are unremarkable. No acute inflammatory process or abnormal
fluid collections are seen.
IMPRESSION: Two radiopaque foreign bodies consistent with safety pins are now
seen within mid pelvic small bowel. No evidence of small bowel
obstruction, perforation, or other complication.

## 2013-07-06 IMAGING — CR DG ABDOMEN 2V
1 series · 4 of 4 positions shown · non-contrast
Comparison: [DATE]

CLINICAL DATA: Foreign body ingestion

EXAM:
ABDOMEN - 2 VIEW

[Series 1: erect ap · 0.17mm/px · 4 of 4 slices shown]
[im 1/4]
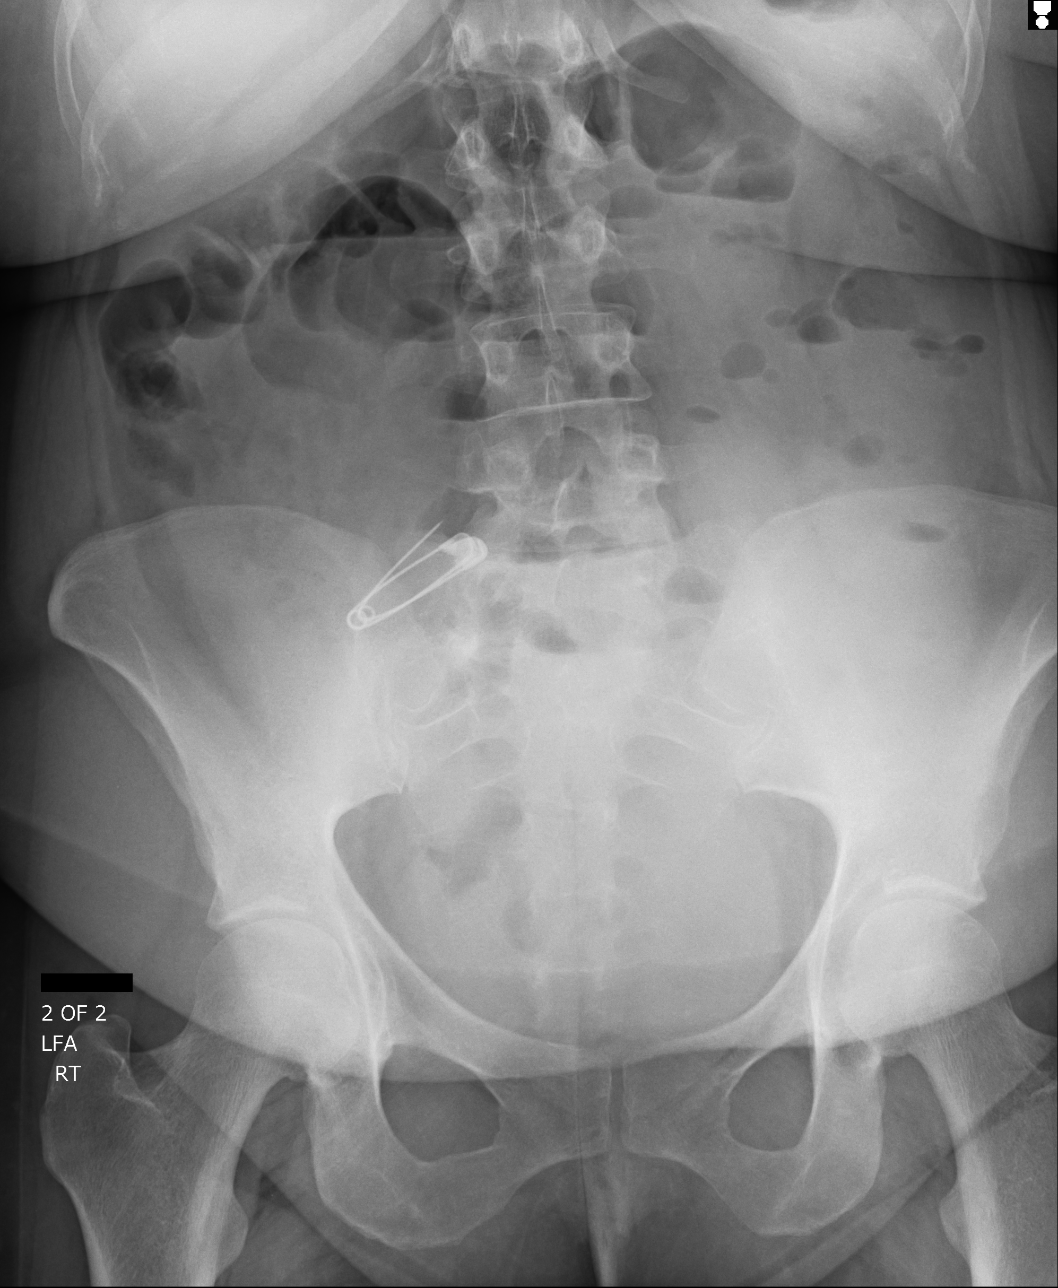
[im 2/4]
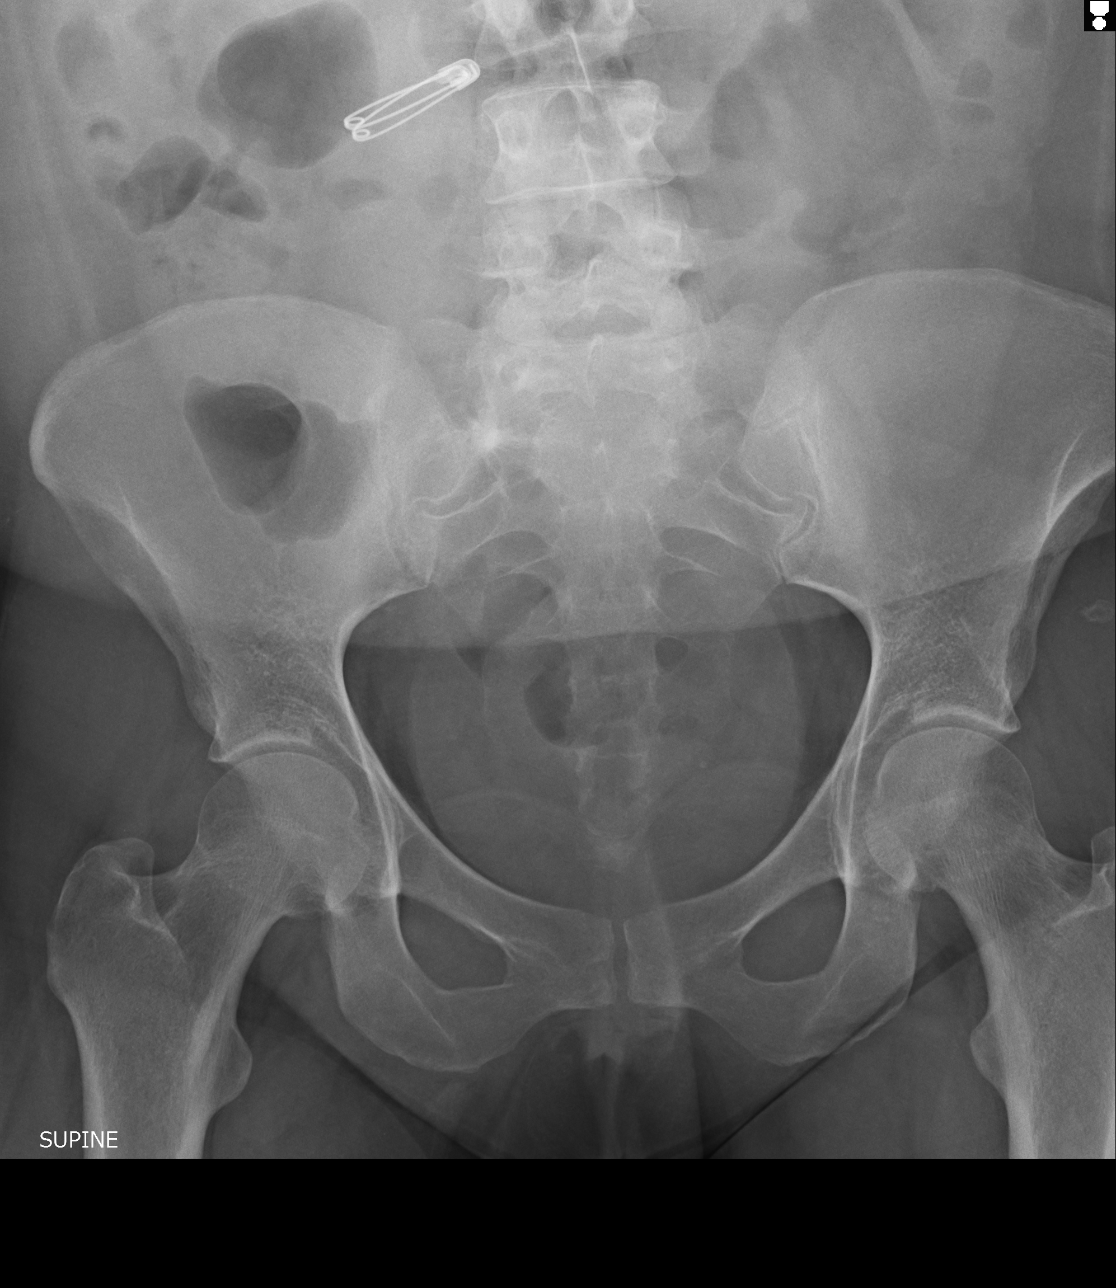
[im 3/4]
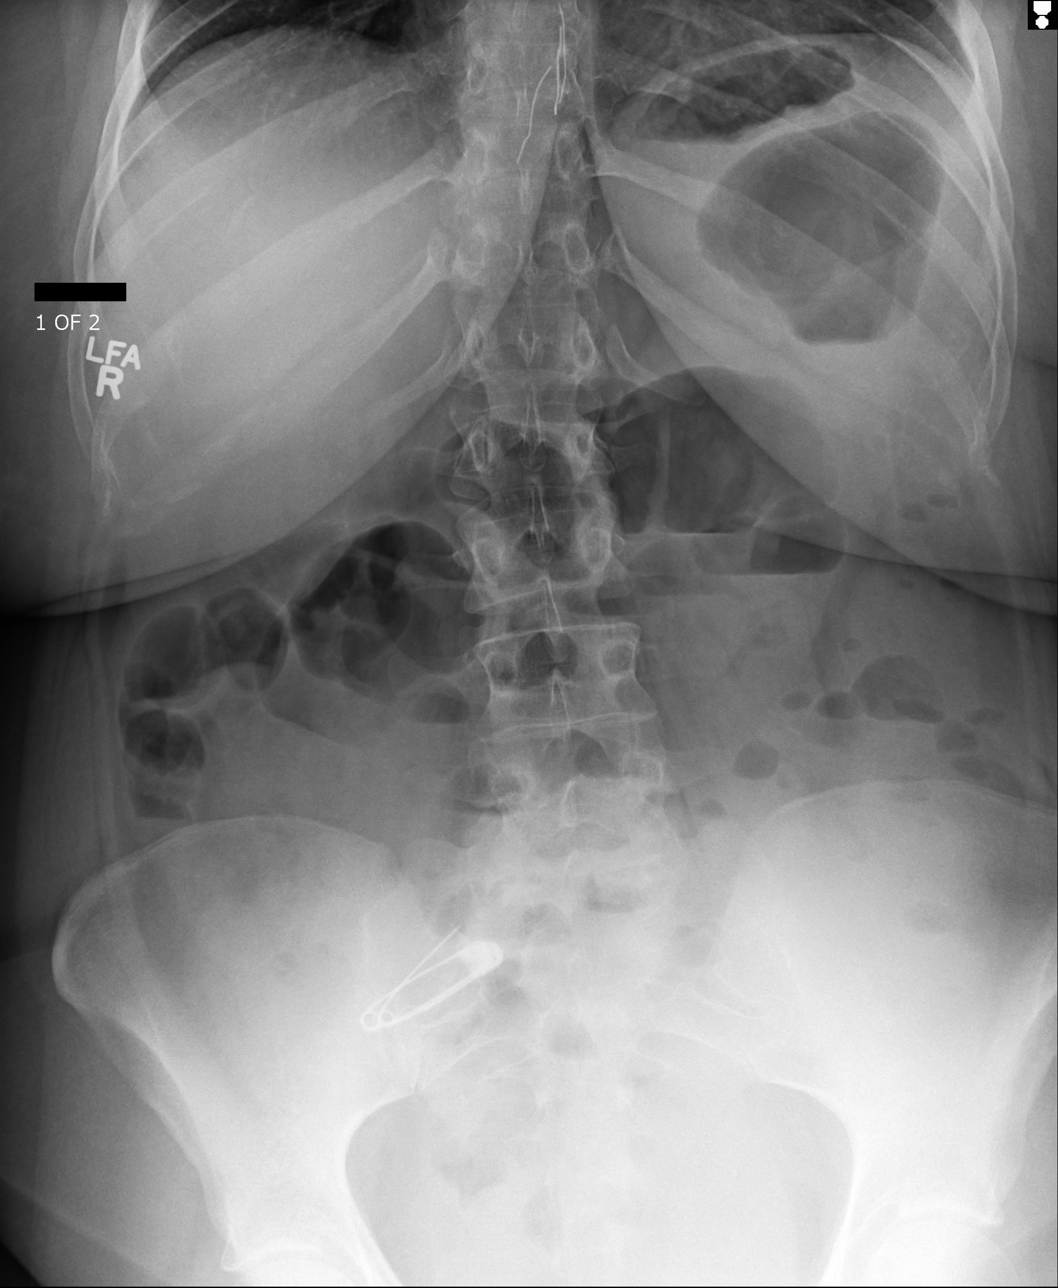
[im 4/4]
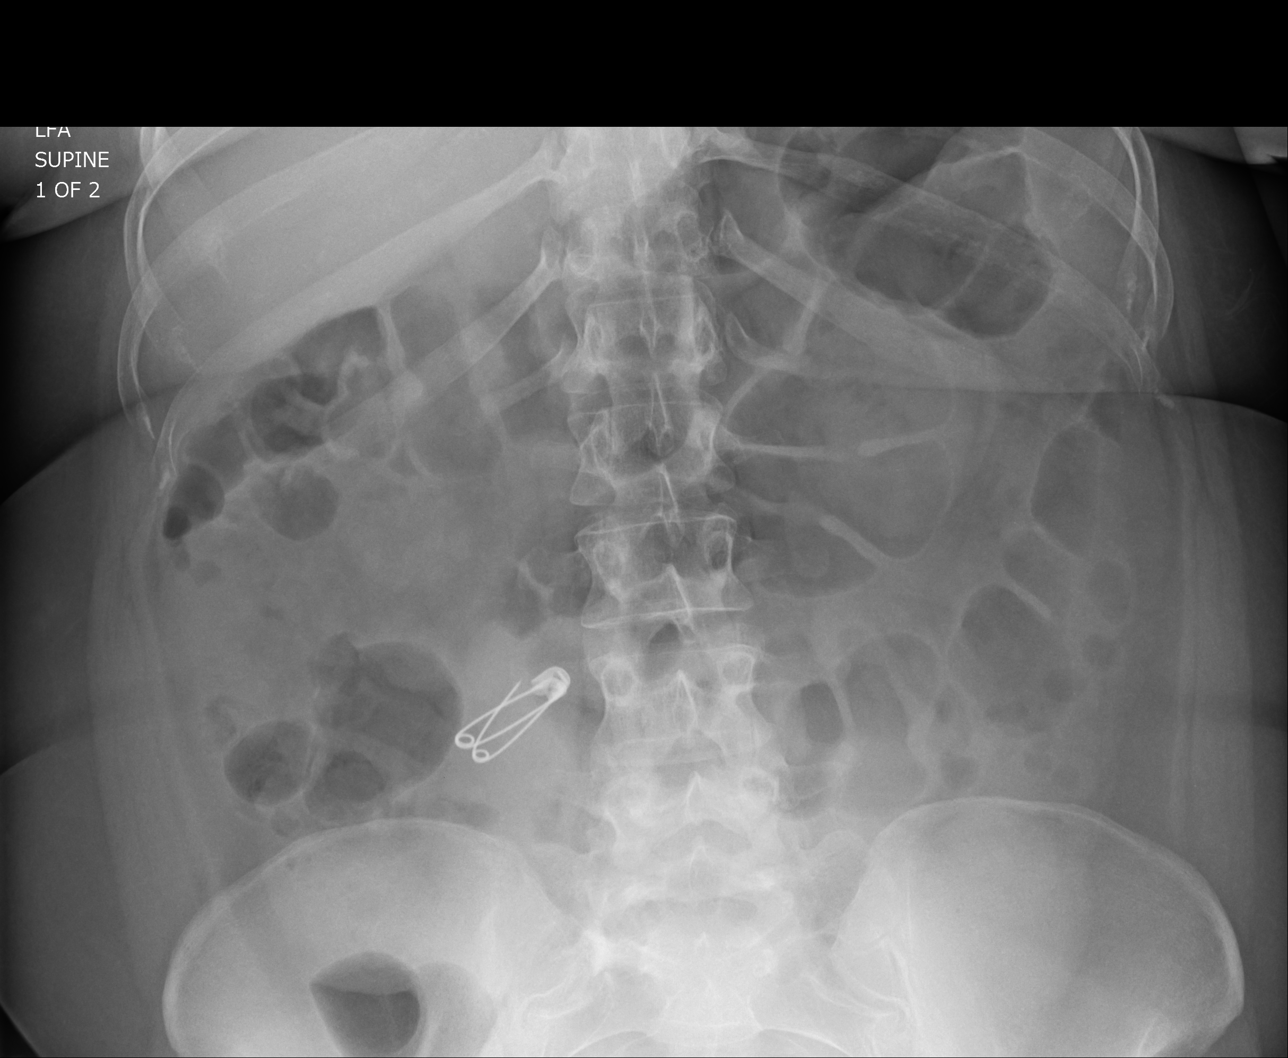

[4 of 4 positions shown; findings below may reference images not displayed]

FINDINGS: Stable position of the ingested safety pins projecting over the
right lower quadrant. Nonspecific bowel gas pattern with scattered
air-fluid levels throughout small and large bowel. No free air.
Stable linear radiopaque foreign bodies project over the lower
thoracic spine as before. No osseous abnormality.
IMPRESSION: Relatively stable position of the ingested radiopaque foreign
bodies. Stable bowel gas pattern. No free air.

## 2013-07-07 LAB — CBC WITH DIFFERENTIAL/PLATELET
Basophil #: 0 10*3/uL (ref 0.0–0.1)
Basophil %: 0.4 %
EOS ABS: 0.2 10*3/uL (ref 0.0–0.7)
EOS PCT: 3.3 %
HCT: 40.3 % (ref 35.0–47.0)
HGB: 13.6 g/dL (ref 12.0–16.0)
LYMPHS PCT: 22 %
Lymphocyte #: 1.6 10*3/uL (ref 1.0–3.6)
MCH: 30.6 pg (ref 26.0–34.0)
MCHC: 33.9 g/dL (ref 32.0–36.0)
MCV: 90 fL (ref 80–100)
MONO ABS: 0.5 x10 3/mm (ref 0.2–0.9)
MONOS PCT: 7 %
Neutrophil #: 5 10*3/uL (ref 1.4–6.5)
Neutrophil %: 67.3 %
PLATELETS: 194 10*3/uL (ref 150–440)
RBC: 4.46 10*6/uL (ref 3.80–5.20)
RDW: 12.9 % (ref 11.5–14.5)
WBC: 7.4 10*3/uL (ref 3.6–11.0)

## 2013-07-07 LAB — BASIC METABOLIC PANEL
Anion Gap: 3 — ABNORMAL LOW (ref 7–16)
BUN: 7 mg/dL (ref 7–18)
CALCIUM: 9.6 mg/dL (ref 8.5–10.1)
CHLORIDE: 107 mmol/L (ref 98–107)
CREATININE: 0.78 mg/dL (ref 0.60–1.30)
Co2: 27 mmol/L (ref 21–32)
EGFR (African American): >60
EGFR (Non-African Amer.): >60
GLUCOSE: 94 mg/dL (ref 65–99)
Osmolality: 272 (ref 275–301)
Potassium: 4.3 mmol/L (ref 3.5–5.1)
Sodium: 137 mmol/L (ref 136–145)

## 2013-07-07 IMAGING — CR DG ABDOMEN 1V
1 series · 1 of 1 positions shown · non-contrast
Comparison: [DATE]

CLINICAL DATA: Evaluate foreign body progression

EXAM:
ABDOMEN - 1 VIEW

[t abdomen supine]
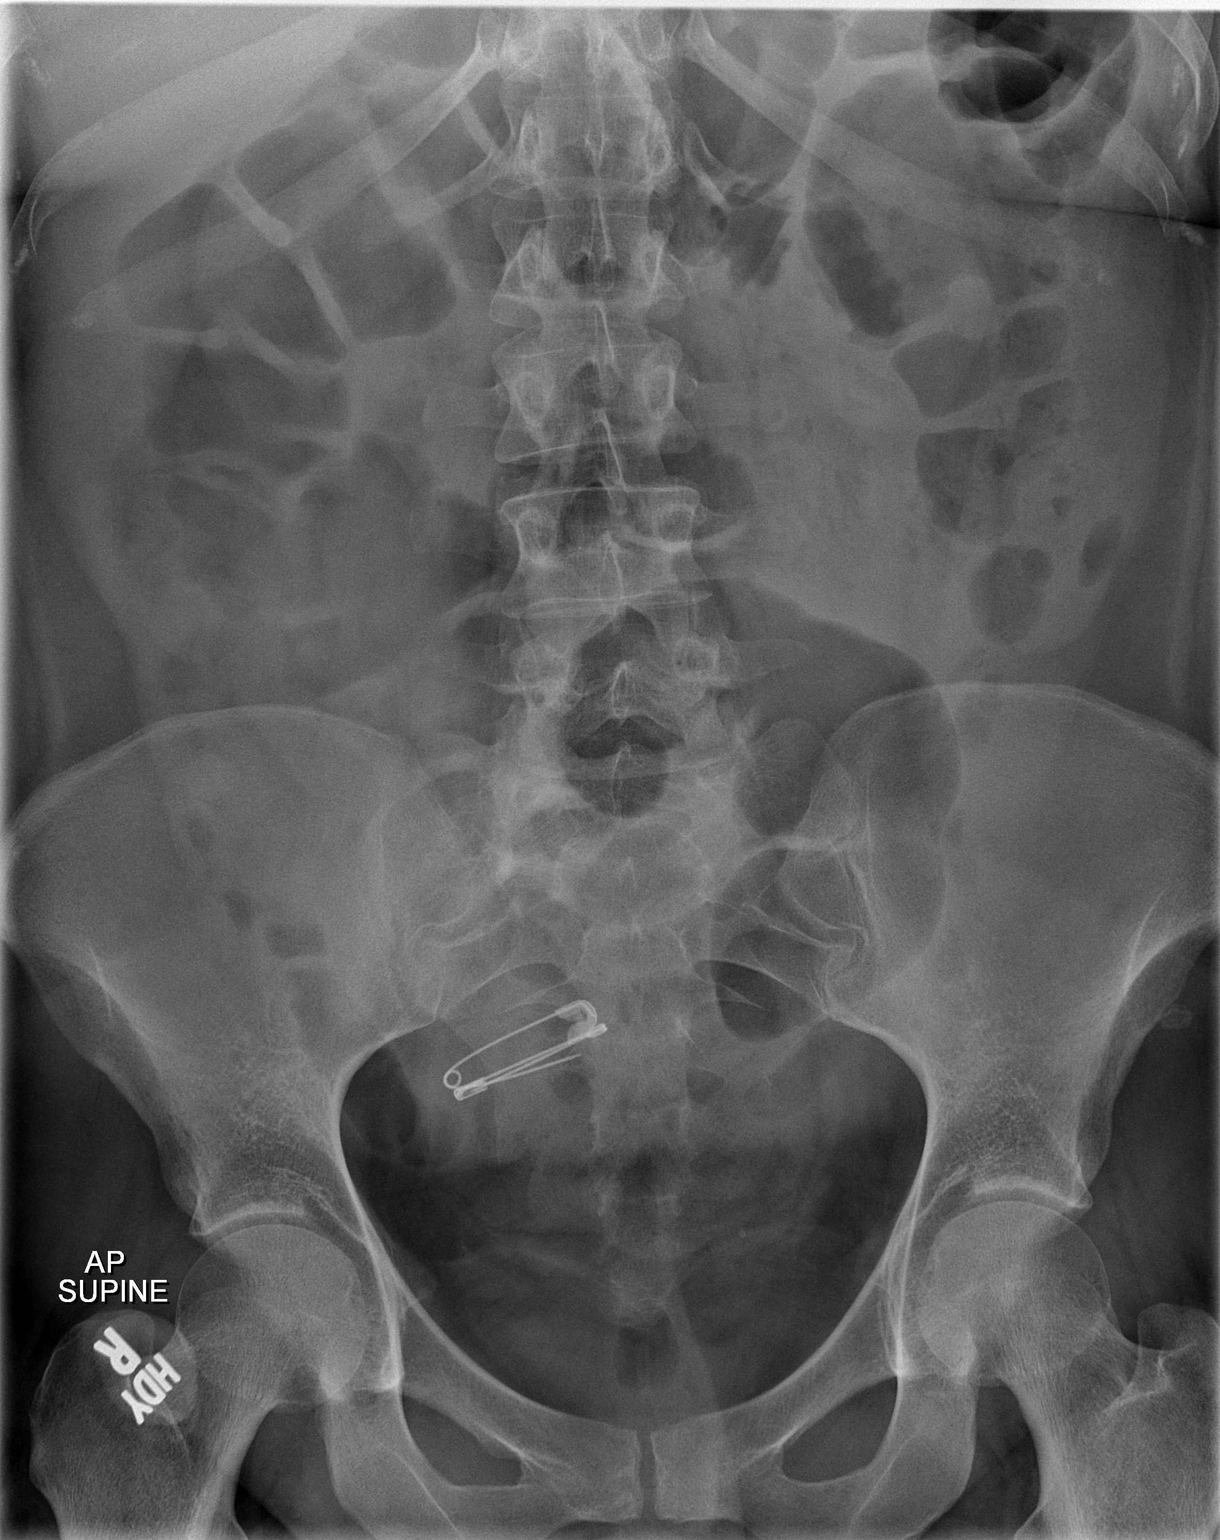

[1 of 1 positions shown; findings below may reference images not displayed]

FINDINGS: Radiopaque foreign bodies, safety pins, again project over the
central right pelvis.

Bowel gas pattern normal.

No bowel dilatation or bowel wall thickening.

No gross evidence of bowel obstruction.

Bones unremarkable.

No urinary tract calcification.
IMPRESSION: Persistent visualization of radiopaque foreign bodies (safety pins)
in right central pelvis, not significantly changed.

## 2013-07-10 LAB — PATHOLOGY REPORT

## 2013-07-15 IMAGING — CR DG ABDOMEN 1V
1 series · 2 of 2 positions shown · non-contrast
Comparison: [DATE]

CLINICAL DATA: Lower abdominal pain. Patient swallowed safety pins.

EXAM:
ABDOMEN - 1 VIEW

[Series 1: t abdomen supine · 0.14mm/px · 2 of 2 slices shown]
[im 1/2]
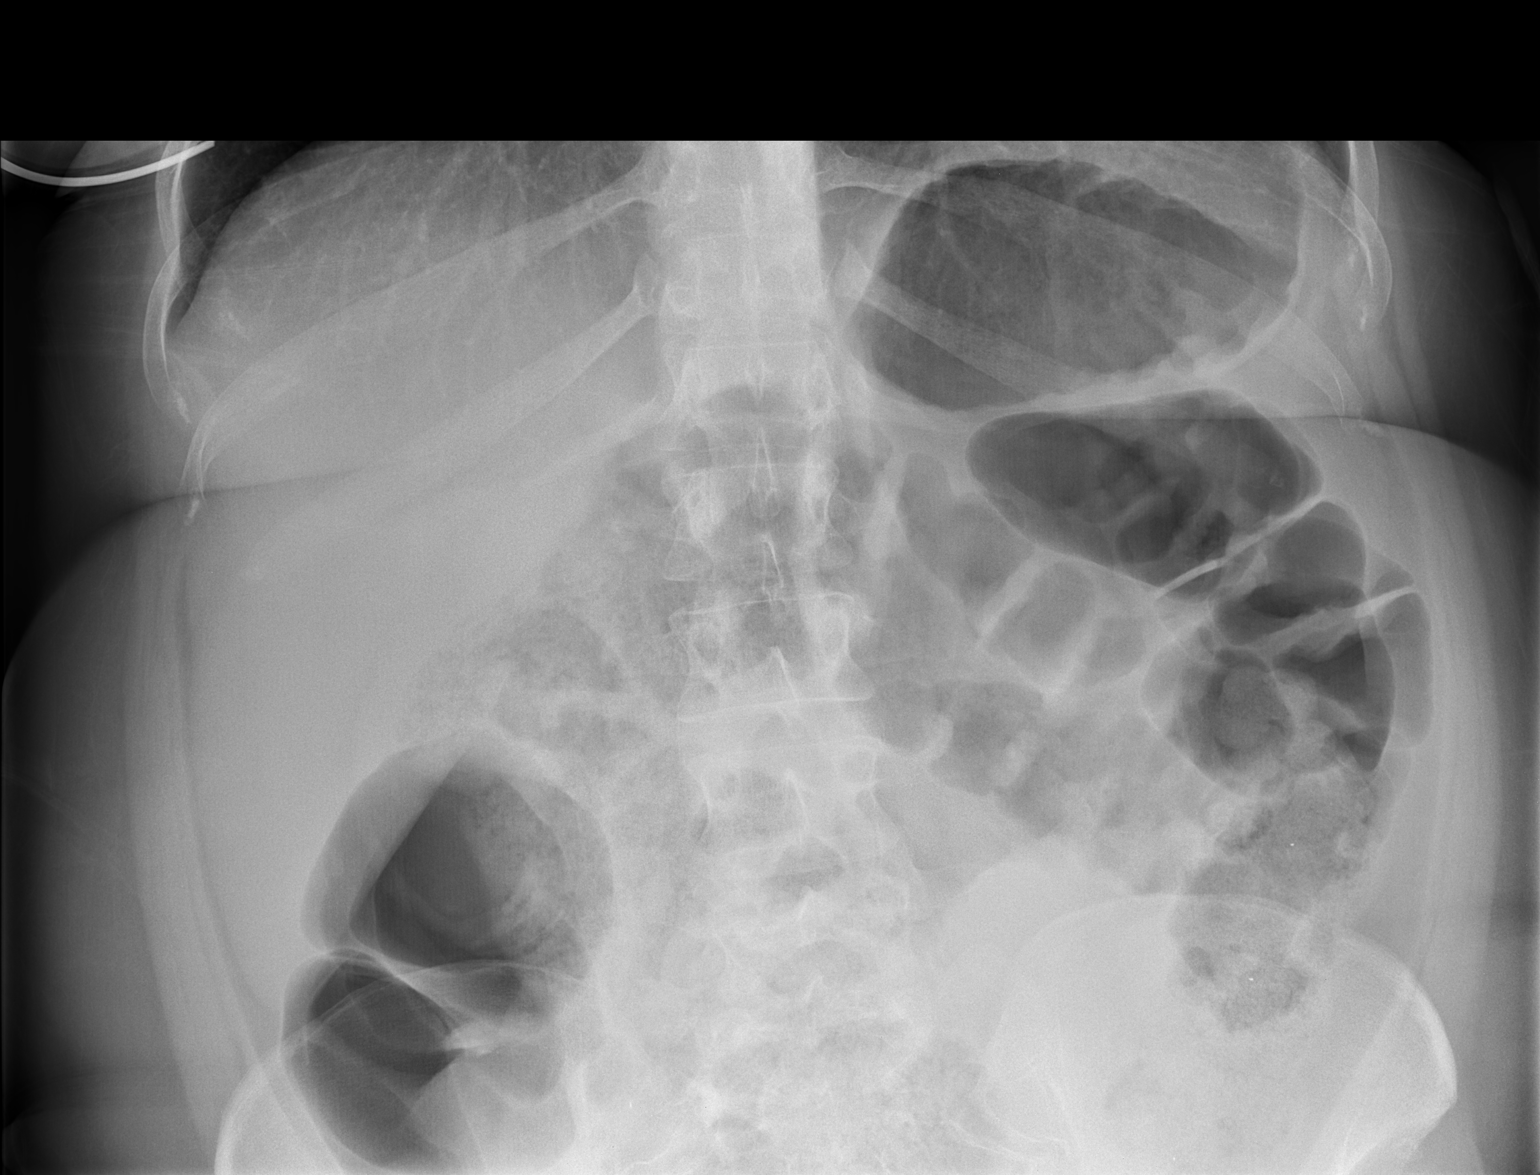
[im 2/2]
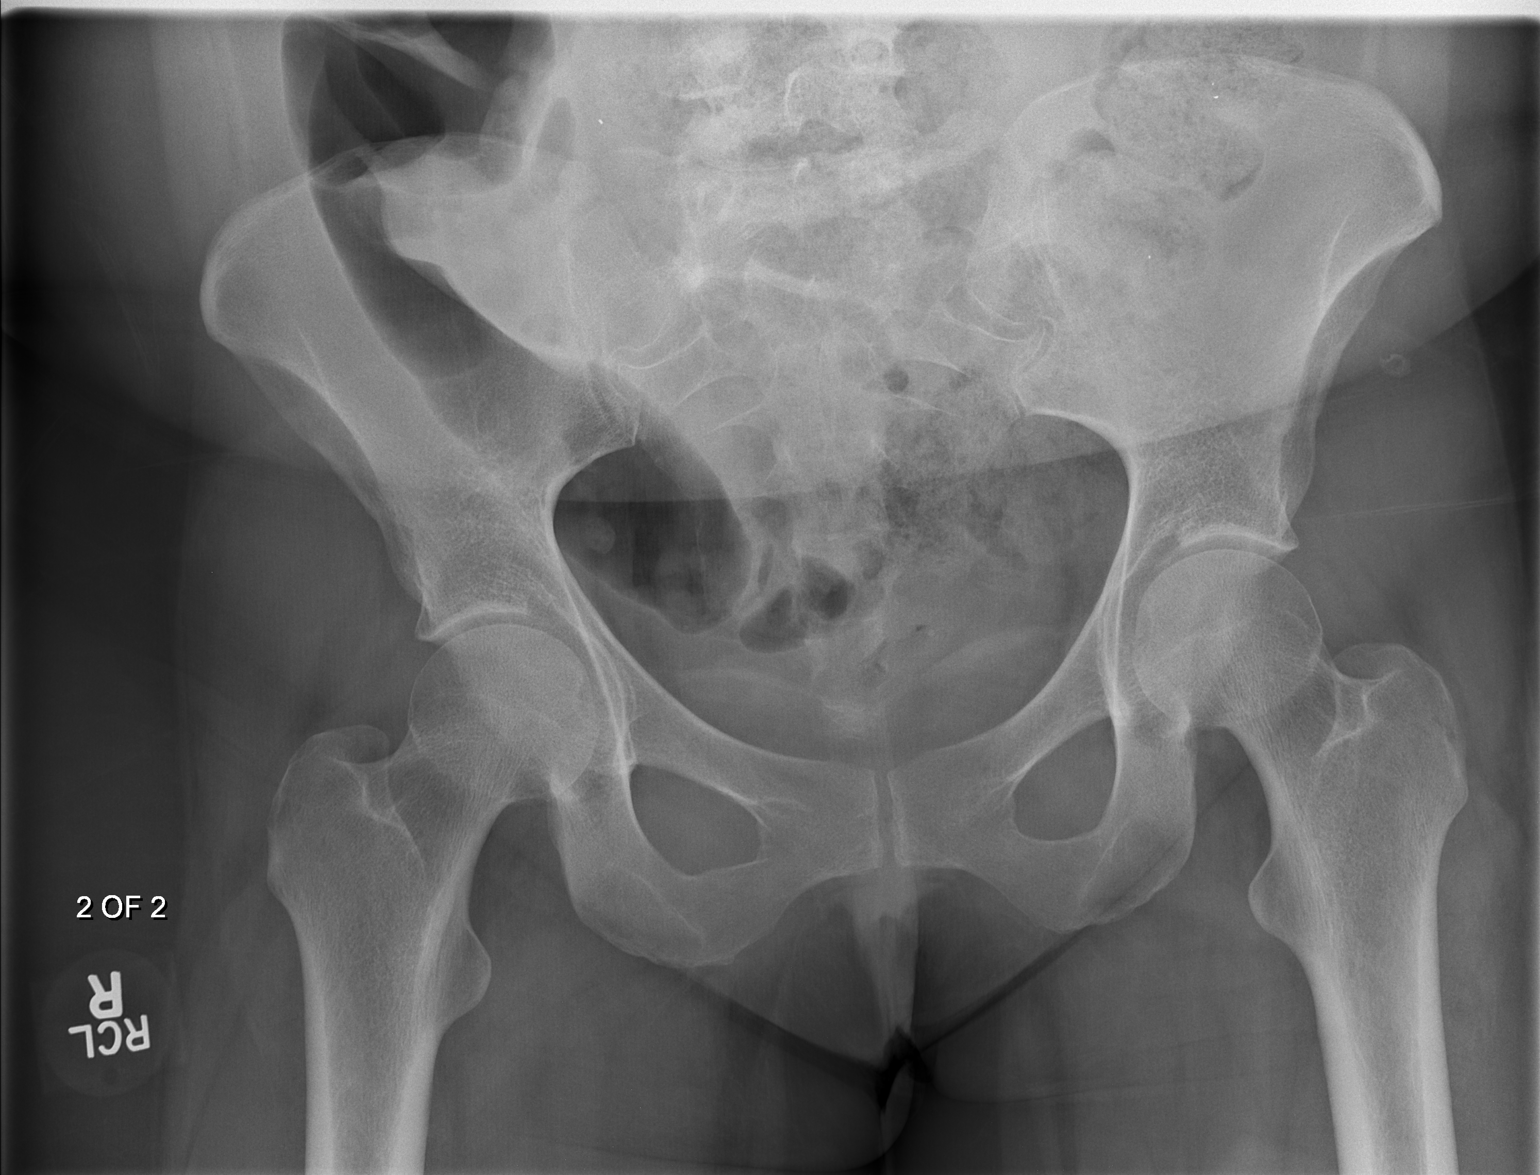

[2 of 2 positions shown; findings below may reference images not displayed]

FINDINGS: The bowel gas pattern is normal. There is a moderate stool burden
within the colon which may reflect constipation. There is no
radio-opaque foreign body identified.
IMPRESSION: 1. The safety pin appears to have passed.
2. Moderate stool burden within the colon suggesting constipation.

## 2013-08-06 IMAGING — CR DG ABDOMEN 1V
1 series · 1 of 1 positions shown · non-contrast
Comparison: [DATE]

CLINICAL DATA: Patient swallowed open safety pin

EXAM:
ABDOMEN - 1 VIEW

[dg abd 1 view]
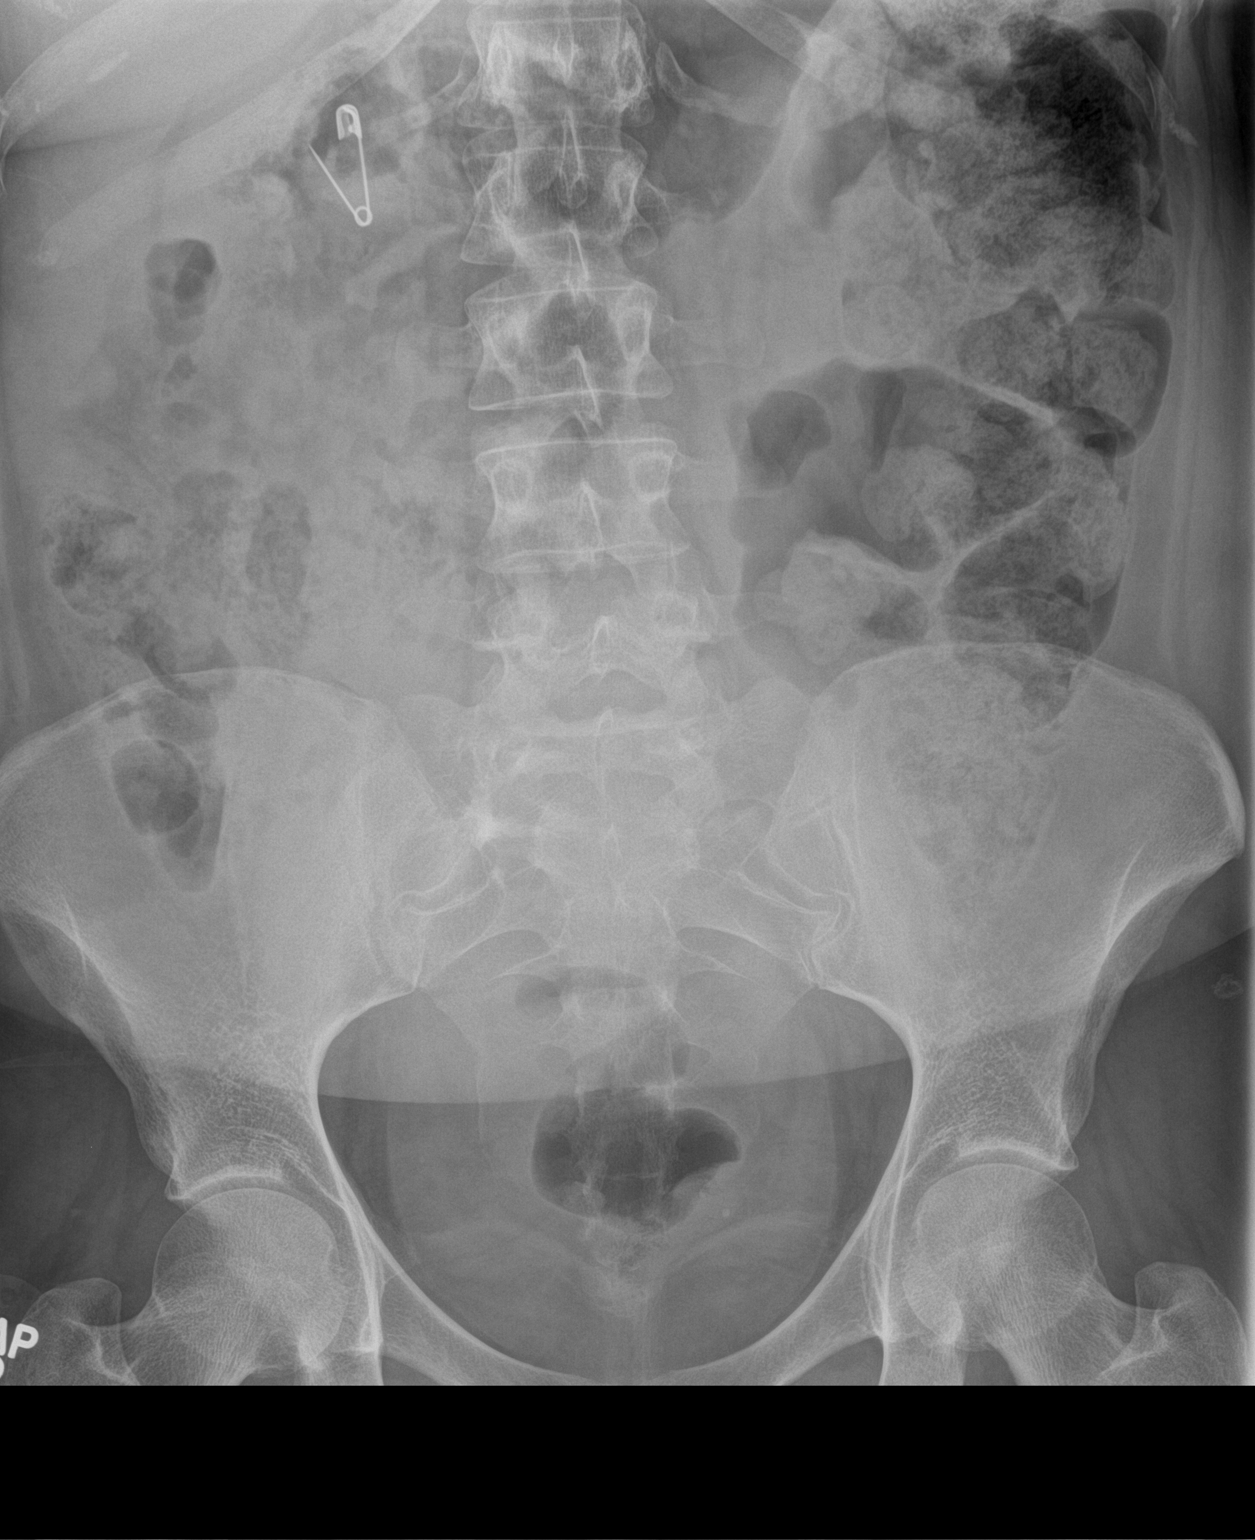

[1 of 1 positions shown; findings below may reference images not displayed]

FINDINGS: Open safety pin is now at the level of the pylorus. There is diffuse
stool throughout the colon. The bowel gas pattern is unremarkable.
No free air is seen on this supine examination.
IMPRESSION: Open safety pin at level of pylorus. Bowel gas pattern unremarkable.
No free air is seen on this supine examination.

## 2013-09-04 ENCOUNTER — Inpatient Hospital Stay: Payer: Self-pay | Admitting: Psychiatry

## 2013-09-04 LAB — DRUG SCREEN, URINE
AMPHETAMINES, UR SCREEN: NEGATIVE (ref ?–1000)
Barbiturates, Ur Screen: NEGATIVE (ref ?–200)
Benzodiazepine, Ur Scrn: NEGATIVE (ref ?–200)
Cannabinoid 50 Ng, Ur ~~LOC~~: NEGATIVE (ref ?–50)
Cocaine Metabolite,Ur ~~LOC~~: NEGATIVE (ref ?–300)
MDMA (ECSTASY) UR SCREEN: NEGATIVE (ref ?–500)
METHADONE, UR SCREEN: NEGATIVE (ref ?–300)
Opiate, Ur Screen: NEGATIVE (ref ?–300)
PHENCYCLIDINE (PCP) UR S: NEGATIVE (ref ?–25)
Tricyclic, Ur Screen: NEGATIVE (ref ?–1000)

## 2013-09-04 LAB — COMPREHENSIVE METABOLIC PANEL
Albumin: 3.8 g/dL (ref 3.4–5.0)
Alkaline Phosphatase: 115 U/L
Anion Gap: 4 — ABNORMAL LOW (ref 7–16)
BUN: 5 mg/dL — ABNORMAL LOW (ref 7–18)
Bilirubin,Total: 0.3 mg/dL (ref 0.2–1.0)
CHLORIDE: 112 mmol/L — AB (ref 98–107)
CO2: 24 mmol/L (ref 21–32)
Calcium, Total: 9.3 mg/dL (ref 8.5–10.1)
Creatinine: 0.53 mg/dL — ABNORMAL LOW (ref 0.60–1.30)
EGFR (African American): >60
EGFR (Non-African Amer.): >60
GLUCOSE: 89 mg/dL (ref 65–99)
Osmolality: 276 (ref 275–301)
Potassium: 4 mmol/L (ref 3.5–5.1)
SGOT(AST): 21 U/L (ref 15–37)
SGPT (ALT): 10 U/L — ABNORMAL LOW (ref 12–78)
Sodium: 140 mmol/L (ref 136–145)
TOTAL PROTEIN: 7.5 g/dL (ref 6.4–8.2)

## 2013-09-04 LAB — ETHANOL
Ethanol %: <0.003 % (ref 0.000–0.080)
Ethanol: <3 mg/dL

## 2013-09-04 LAB — URINALYSIS, COMPLETE
Bacteria: NONE SEEN
Bilirubin,UR: NEGATIVE
Blood: NEGATIVE
Glucose,UR: NEGATIVE mg/dL (ref 0–75)
KETONE: NEGATIVE
Leukocyte Esterase: NEGATIVE
NITRITE: NEGATIVE
PH: 7 (ref 4.5–8.0)
Protein: NEGATIVE
RBC,UR: <1 /HPF (ref 0–5)
Specific Gravity: 1.008 (ref 1.003–1.030)
WBC UR: 1 /HPF (ref 0–5)

## 2013-09-04 LAB — LITHIUM LEVEL: Lithium: 0.67 mmol/L

## 2013-09-04 LAB — SALICYLATE LEVEL: Salicylates, Serum: 2.4 mg/dL

## 2013-09-04 LAB — CBC
HCT: 38.6 % (ref 35.0–47.0)
HGB: 12.9 g/dL (ref 12.0–16.0)
MCH: 30.1 pg (ref 26.0–34.0)
MCHC: 33.3 g/dL (ref 32.0–36.0)
MCV: 90 fL (ref 80–100)
PLATELETS: 211 10*3/uL (ref 150–440)
RBC: 4.28 10*6/uL (ref 3.80–5.20)
RDW: 14.2 % (ref 11.5–14.5)
WBC: 8.3 10*3/uL (ref 3.6–11.0)

## 2013-09-04 LAB — ACETAMINOPHEN LEVEL

## 2013-09-04 LAB — PREGNANCY, URINE: PREGNANCY TEST, URINE: NEGATIVE m[IU]/mL

## 2013-10-13 ENCOUNTER — Emergency Department: Payer: Self-pay | Admitting: Emergency Medicine

## 2013-10-13 LAB — COMPREHENSIVE METABOLIC PANEL
ALK PHOS: 116 U/L
ANION GAP: 7 (ref 7–16)
Albumin: 3.6 g/dL (ref 3.4–5.0)
BILIRUBIN TOTAL: 0.2 mg/dL (ref 0.2–1.0)
BUN: 4 mg/dL — AB (ref 7–18)
CHLORIDE: 109 mmol/L — AB (ref 98–107)
Calcium, Total: 9 mg/dL (ref 8.5–10.1)
Co2: 23 mmol/L (ref 21–32)
Creatinine: 0.66 mg/dL (ref 0.60–1.30)
Glucose: 68 mg/dL (ref 65–99)
Osmolality: 273 (ref 275–301)
POTASSIUM: 3.4 mmol/L — AB (ref 3.5–5.1)
SGOT(AST): 13 U/L — ABNORMAL LOW (ref 15–37)
SGPT (ALT): 9 U/L — ABNORMAL LOW (ref 12–78)
SODIUM: 139 mmol/L (ref 136–145)
Total Protein: 7 g/dL (ref 6.4–8.2)

## 2013-10-13 LAB — URINALYSIS, COMPLETE
BILIRUBIN, UR: NEGATIVE
Bacteria: NONE SEEN
Blood: NEGATIVE
Glucose,UR: NEGATIVE mg/dL (ref 0–75)
KETONE: NEGATIVE
Leukocyte Esterase: NEGATIVE
Nitrite: NEGATIVE
Ph: 7 (ref 4.5–8.0)
Protein: NEGATIVE
RBC,UR: <1 /HPF (ref 0–5)
Specific Gravity: 1.001 (ref 1.003–1.030)
Squamous Epithelial: <1
WBC UR: <1 /HPF (ref 0–5)

## 2013-10-13 LAB — CBC
HCT: 37.4 % (ref 35.0–47.0)
HGB: 12.1 g/dL (ref 12.0–16.0)
MCH: 29.6 pg (ref 26.0–34.0)
MCHC: 32.4 g/dL (ref 32.0–36.0)
MCV: 91 fL (ref 80–100)
PLATELETS: 236 10*3/uL (ref 150–440)
RBC: 4.1 10*6/uL (ref 3.80–5.20)
RDW: 14.1 % (ref 11.5–14.5)
WBC: 9.6 10*3/uL (ref 3.6–11.0)

## 2013-10-13 LAB — DRUG SCREEN, URINE

## 2013-10-13 LAB — SALICYLATE LEVEL: SALICYLATES, SERUM: 2.4 mg/dL

## 2013-10-13 LAB — ETHANOL: Ethanol %: <0.003 % (ref 0.000–0.080)

## 2013-10-13 LAB — ACETAMINOPHEN LEVEL: Acetaminophen: <2 ug/mL

## 2013-10-13 IMAGING — CR DG ABDOMEN 3V
1 series · 3 of 3 positions shown · non-contrast
Comparison: [DATE].

CLINICAL DATA: Ingested foreign body.

EXAM:
ABDOMEN SERIES

[Series 1: w chest pa · 0.14mm/px · 3 of 3 slices shown]
[im 1/3]
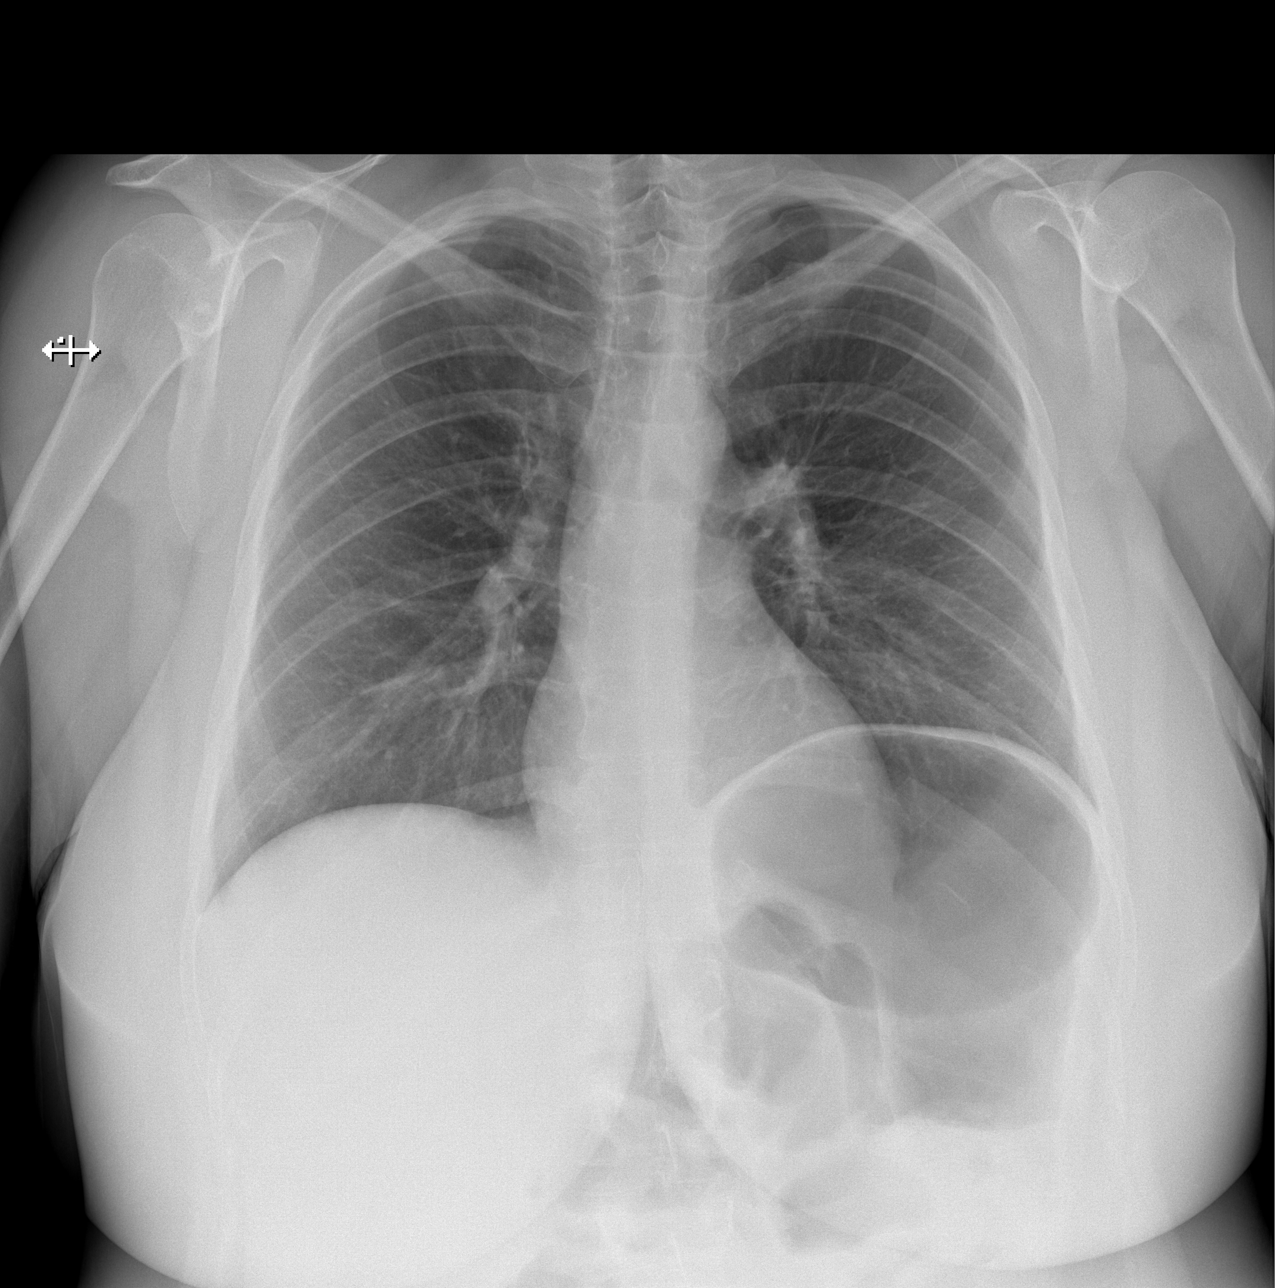
[im 2/3]
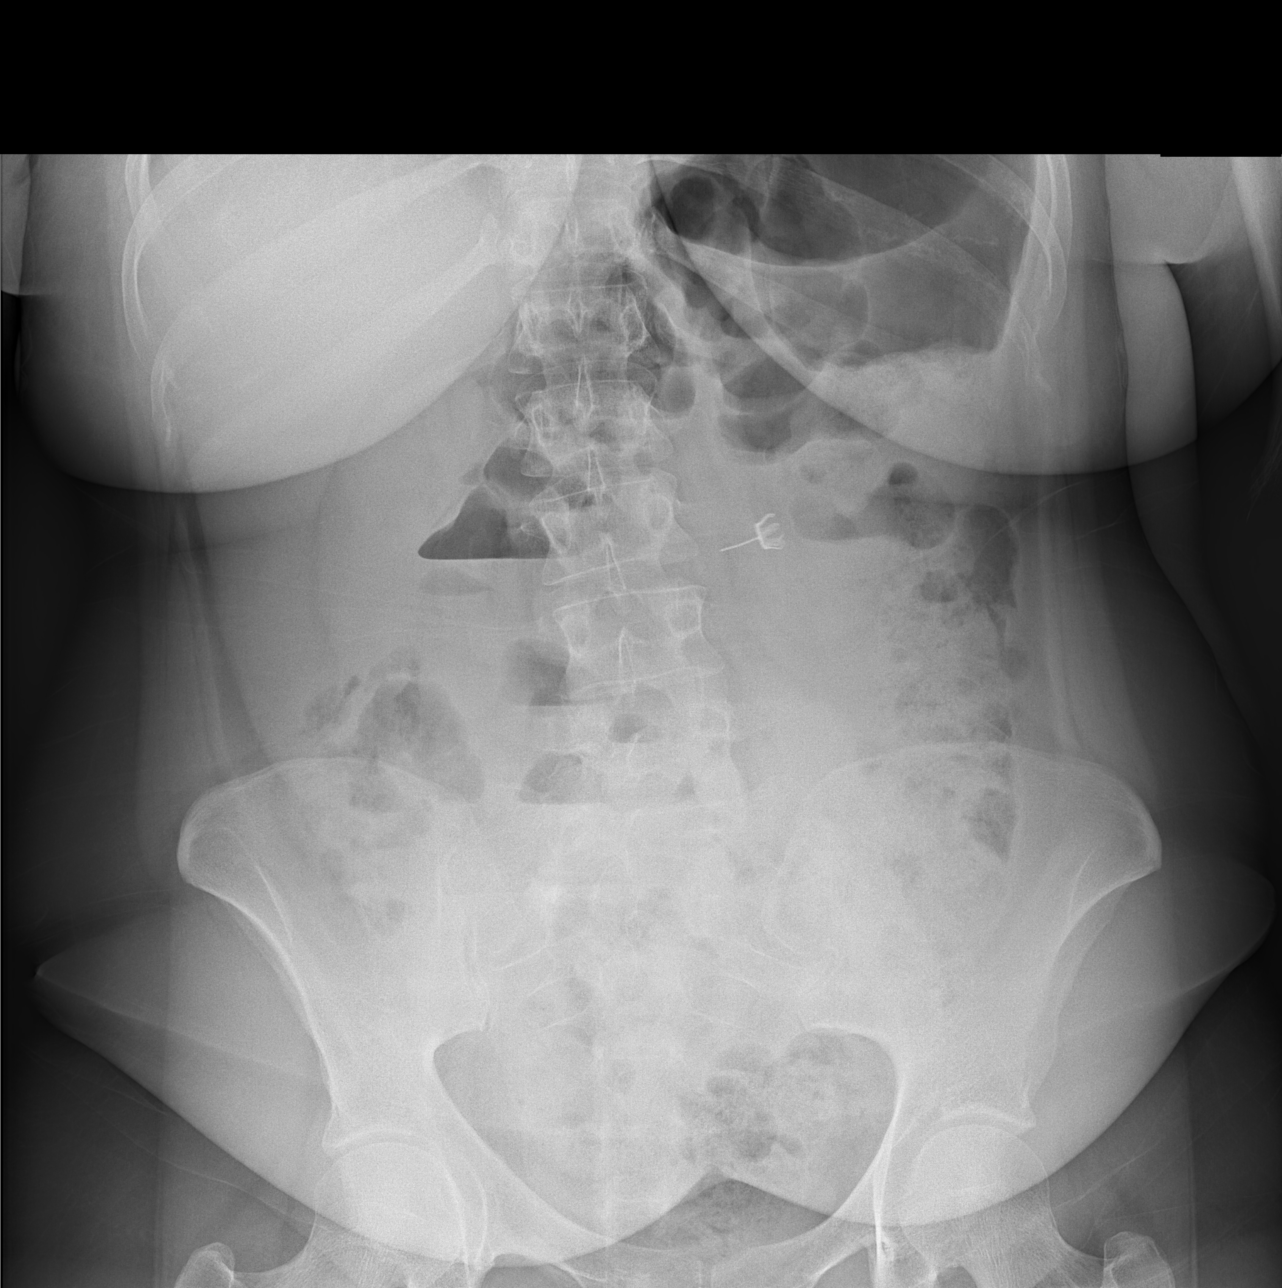
[im 3/3]
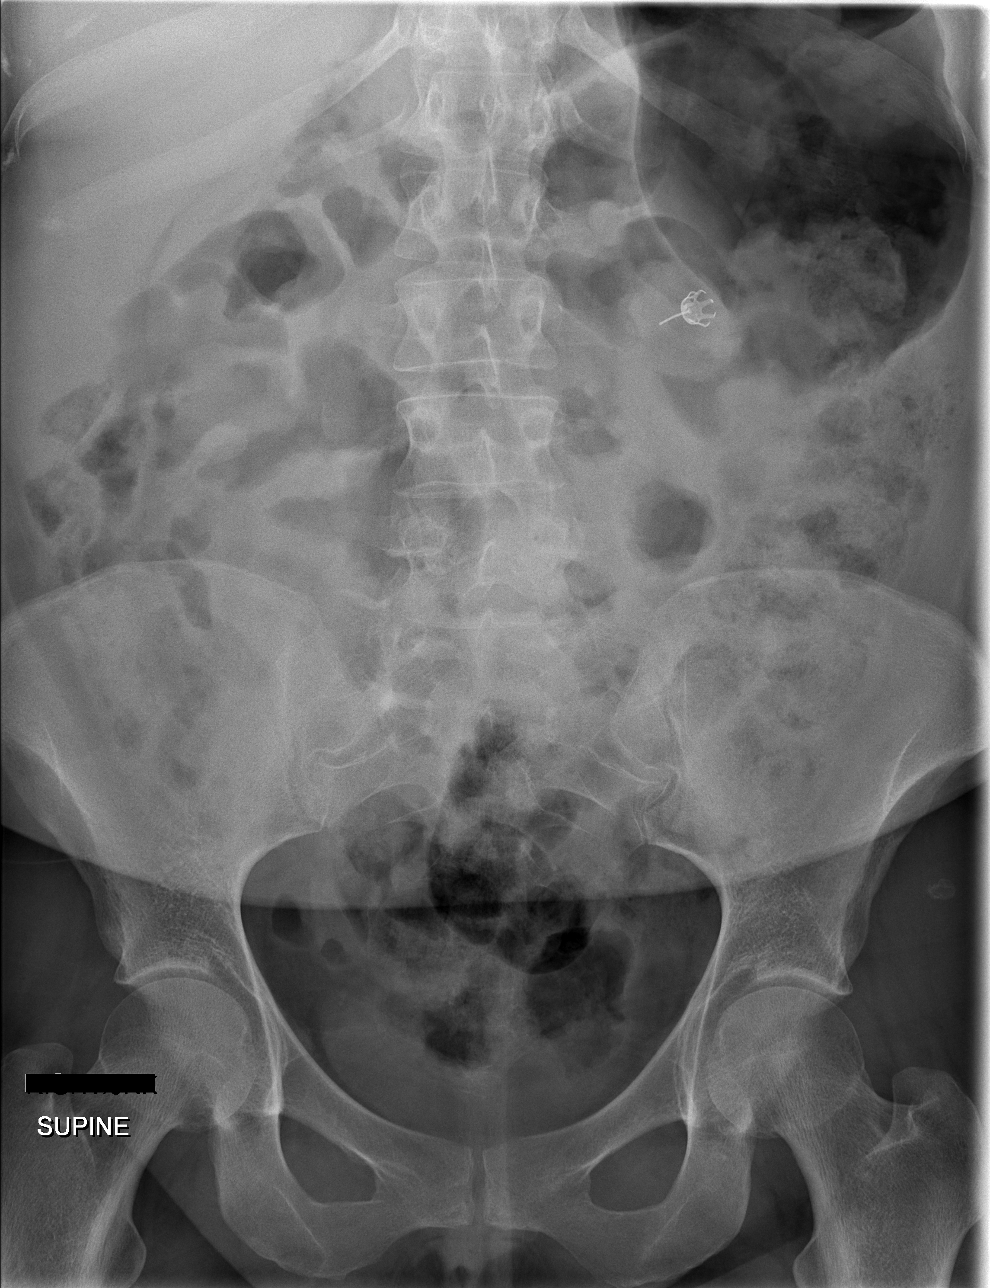

[3 of 3 positions shown; findings below may reference images not displayed]

FINDINGS: No active cardiopulmonary disease.

Is a metallic object measuring about 2 cm in the left central
abdomen, likely an earring. The previously seen safety pins are no
longer visualized. There is no perforation. No plain film evidence
of free air.

On the upright abdominal film, metallic wires are present in the
region of the lower or thoracic esophagus near the gastroesophageal
junction, also likely representing ingested foreign body. These are
difficult to visualize on the frontal view of the chest.
IMPRESSION: 1. Probable earring in the left mid abdomen.
2. Metallic wires likely in the distal esophagus at the
gastroesophageal junction on the upright view of the abdomen.
3. Both objects are compatible with ingested foreign bodies in this
patient with a history of ingesting foreign objects.
4. Negative for free air.

## 2014-05-12 ENCOUNTER — Emergency Department: Payer: Self-pay | Admitting: Emergency Medicine

## 2014-05-12 IMAGING — CR DG LUMBAR SPINE 2-3V
1 series · 3 of 3 positions shown · non-contrast
Comparison: CT [DATE].

CLINICAL DATA: Fall.  Back pain.  Initial encounter.

EXAM:
LUMBAR SPINE - 2-3 VIEW

[Series 1: dxr lumbar spine ap and lateral · 0.14mm/px · 3 of 3 slices shown]
[im 1/3]
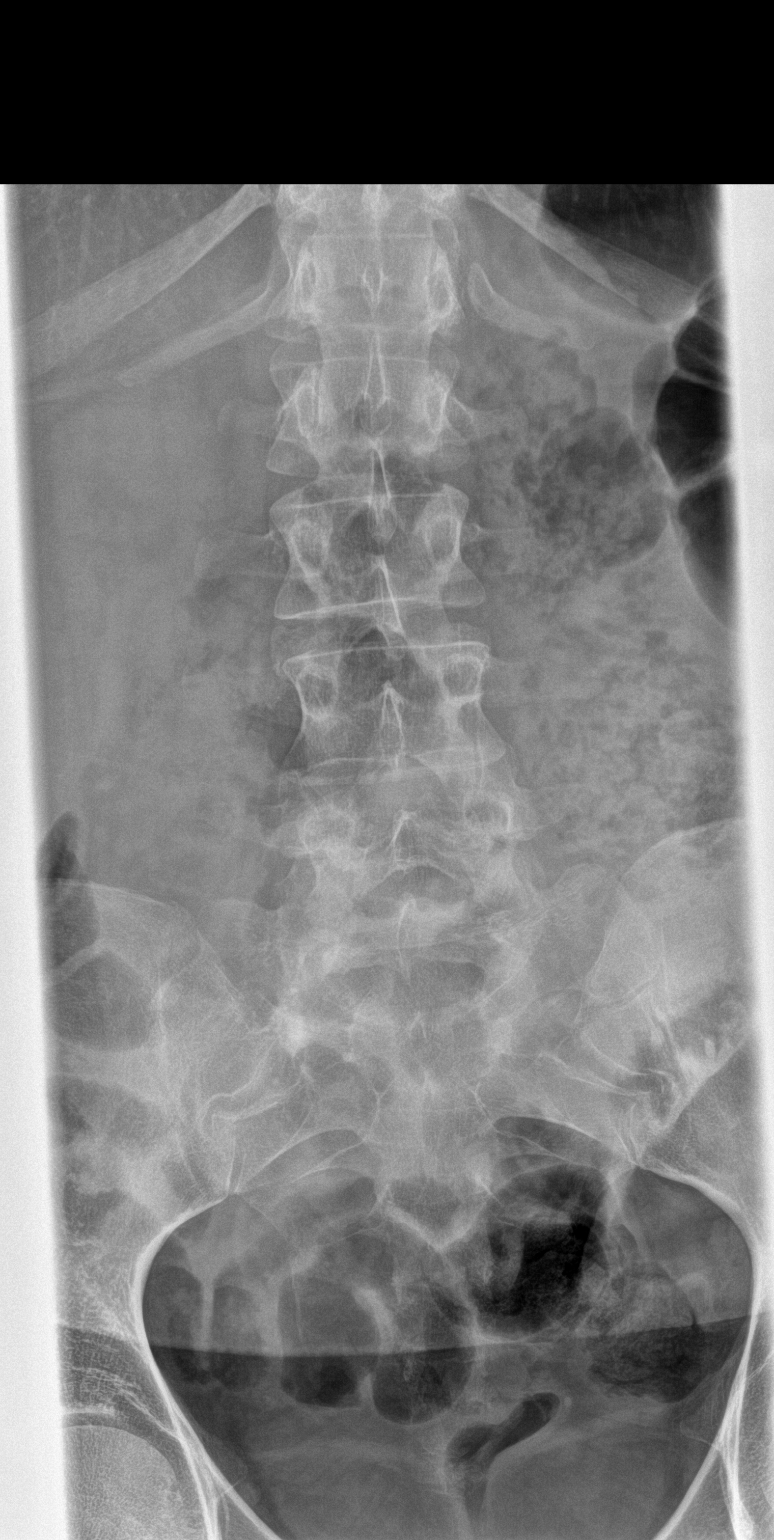
[im 2/3]
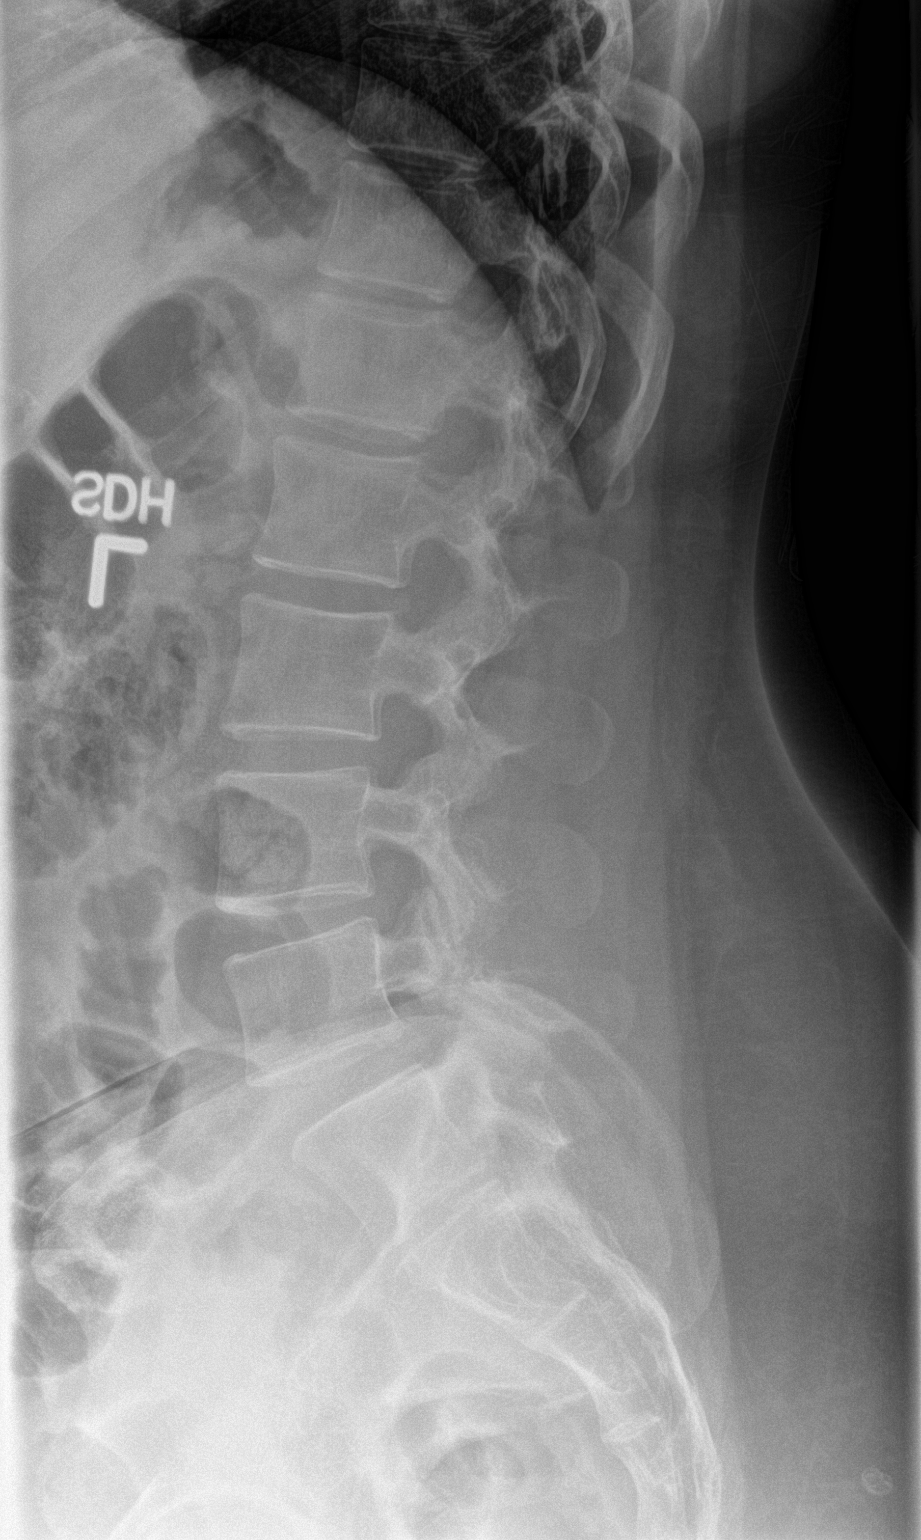
[im 3/3]
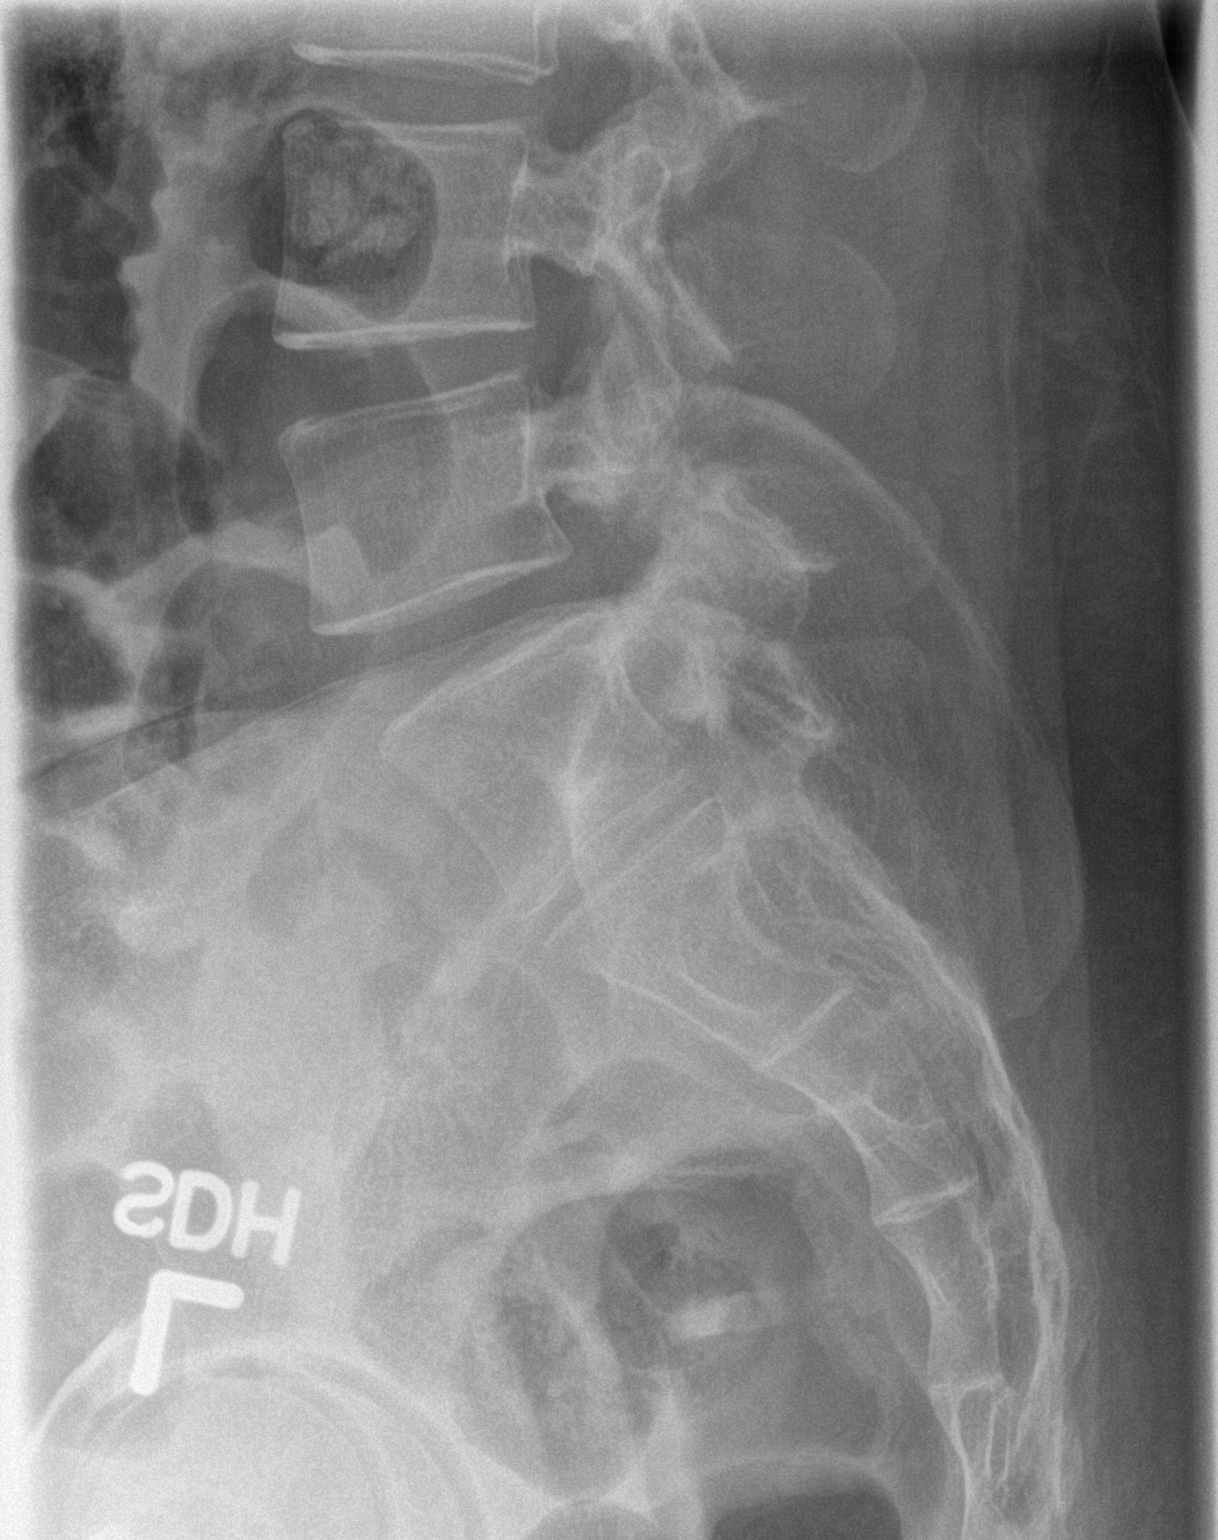

[3 of 3 positions shown; findings below may reference images not displayed]

FINDINGS: There is lumbosacral transitional anatomy. This report assumes that
there are 5 lumbar type vertebral bodies. Recommend close
correlation with radiographs if intervention is elected. L1 has
small ribs bilaterally.

Chronic L5 pars defects are present. There is a mild S-shaped lumbar
scoliosis. No spondylolisthesis. Intervertebral disc spaces are
preserved. Vertebral body height is normal.
IMPRESSION: 1. There is lumbosacral transitional anatomy. This report assumes
that there are 5 lumbar type vertebral bodies. Recommend close
correlation with radiographs if intervention is elected.
2. No acute osseous abnormality.
3. Chronic L5 pars defects.

## 2014-08-17 LAB — COMPREHENSIVE METABOLIC PANEL
ALK PHOS: 78 U/L
Albumin: 4.1 g/dL
Anion Gap: 6 — ABNORMAL LOW (ref 7–16)
BUN: 8 mg/dL
Bilirubin,Total: 0.3 mg/dL
CALCIUM: 9.5 mg/dL
Chloride: 108 mmol/L
Co2: 24 mmol/L
Creatinine: 0.86 mg/dL
EGFR (Non-African Amer.): >60
GLUCOSE: 81 mg/dL
Potassium: 3.9 mmol/L
SGOT(AST): 17 U/L
SGPT (ALT): 8 U/L — ABNORMAL LOW
Sodium: 138 mmol/L
Total Protein: 7 g/dL

## 2014-08-17 LAB — URINALYSIS, COMPLETE
Bacteria: NONE SEEN
Bilirubin,UR: NEGATIVE
Blood: NEGATIVE
Glucose,UR: NEGATIVE mg/dL (ref 0–75)
KETONE: NEGATIVE
Leukocyte Esterase: NEGATIVE
Nitrite: NEGATIVE
PH: 7 (ref 4.5–8.0)
PROTEIN: NEGATIVE
RBC,UR: <1 /HPF (ref 0–5)
Specific Gravity: 1.004 (ref 1.003–1.030)
Squamous Epithelial: <1
WBC UR: 2 /HPF (ref 0–5)

## 2014-08-17 LAB — CBC
HCT: 36.4 % (ref 35.0–47.0)
HGB: 12 g/dL (ref 12.0–16.0)
MCH: 30.4 pg (ref 26.0–34.0)
MCHC: 33.1 g/dL (ref 32.0–36.0)
MCV: 92 fL (ref 80–100)
PLATELETS: 205 10*3/uL (ref 150–440)
RBC: 3.96 10*6/uL (ref 3.80–5.20)
RDW: 13.3 % (ref 11.5–14.5)
WBC: 11.2 10*3/uL — ABNORMAL HIGH (ref 3.6–11.0)

## 2014-08-17 LAB — SALICYLATE LEVEL

## 2014-08-17 LAB — ACETAMINOPHEN LEVEL: Acetaminophen: <10 ug/mL

## 2014-08-17 LAB — ETHANOL

## 2014-08-17 IMAGING — CT CT ABD-PELV W/O CM
2 of 4 series · 16 of 46 positions shown, 18 images · non-contrast
Comparison: [DATE]

CLINICAL DATA: Abdominal pain after swallowing safety pins.

EXAM:
CT ABDOMEN AND PELVIS WITHOUT CONTRAST
TECHNIQUE: Multidetector CT imaging of the abdomen and pelvis was performed
following the standard protocol without IV contrast.

[Series 2: routine abd pel without · axial · non-contrast · 0.81mm/px · z∈[-521,-66]mm · 13 of 101 slices shown, 15 images]
[im 5/101  soft-tissue]
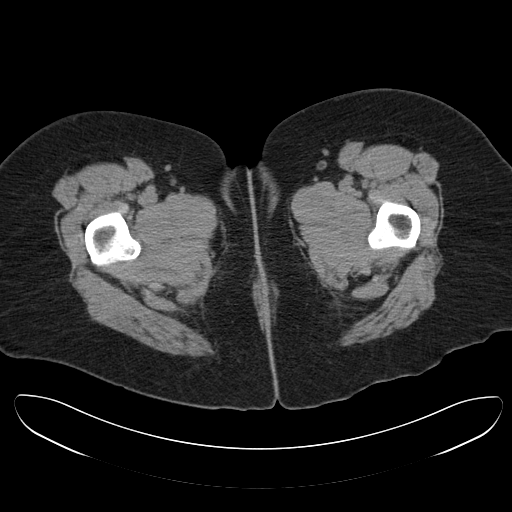
[im 5/101  bone]
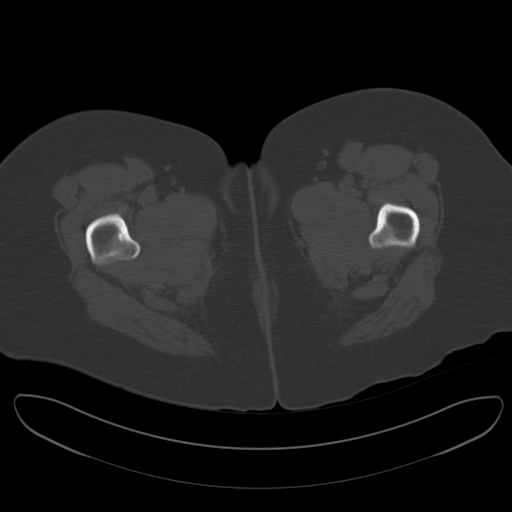
[im 14/101  soft-tissue]
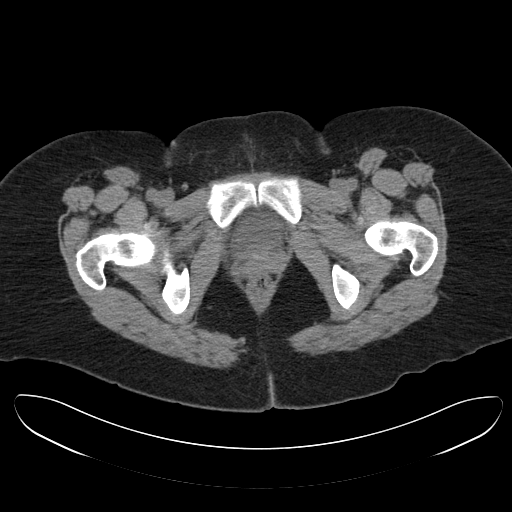
[im 22/101  soft-tissue]
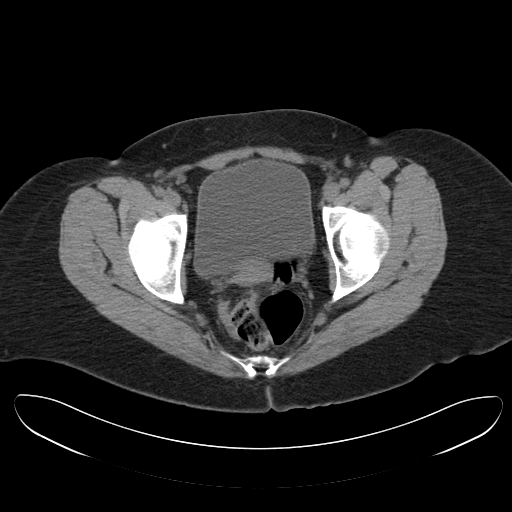
[im 27/101  soft-tissue]
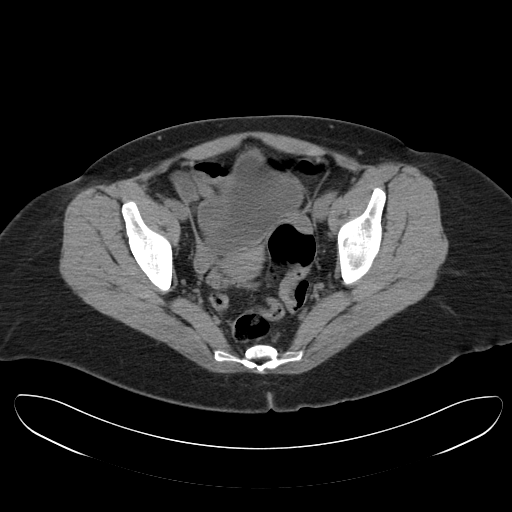
[im 35/101  soft-tissue]
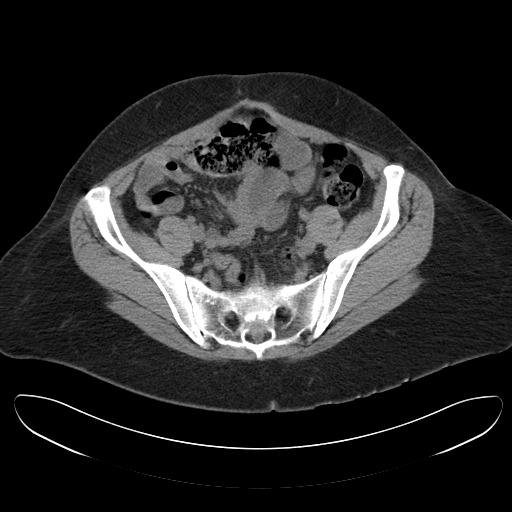
[im 44/101  soft-tissue]
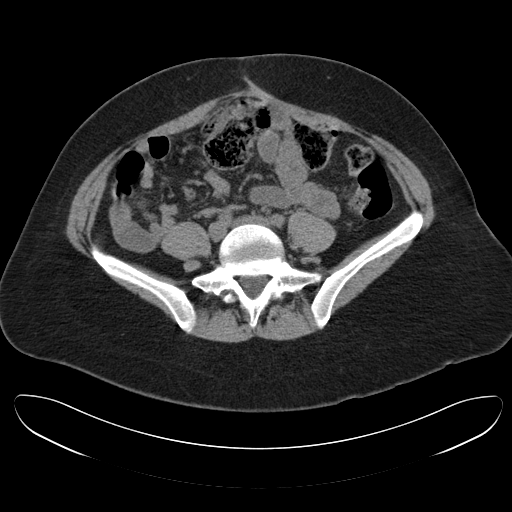
[im 53/101  soft-tissue]
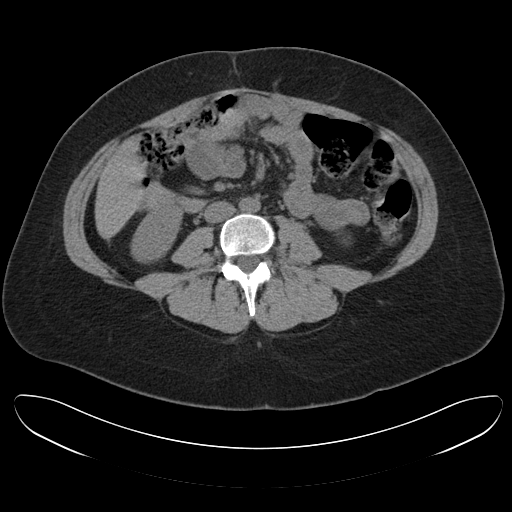
[im 57/101  soft-tissue]
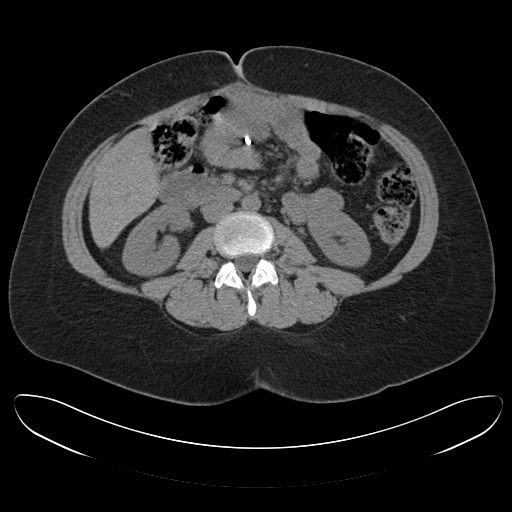
[im 66/101  soft-tissue]
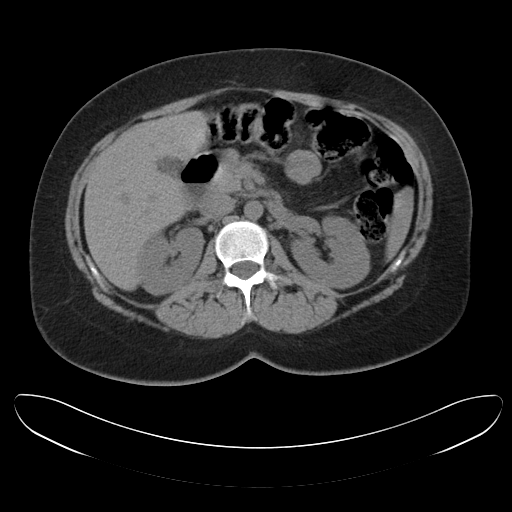
[im 66/101  bone]
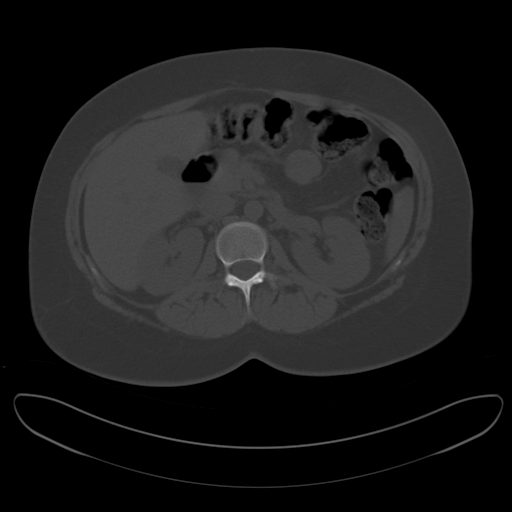
[im 74/101  soft-tissue]
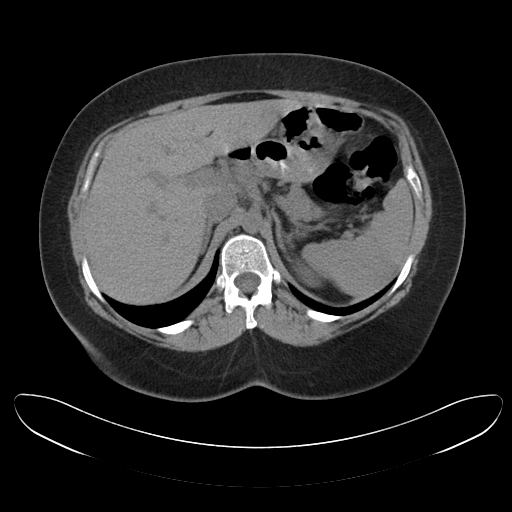
[im 79/101  soft-tissue]
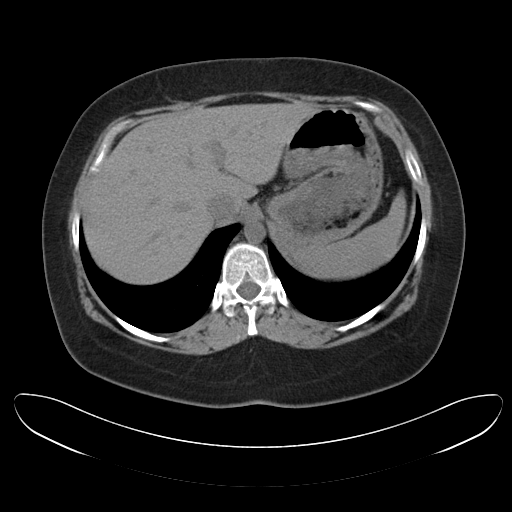
[im 87/101  soft-tissue]
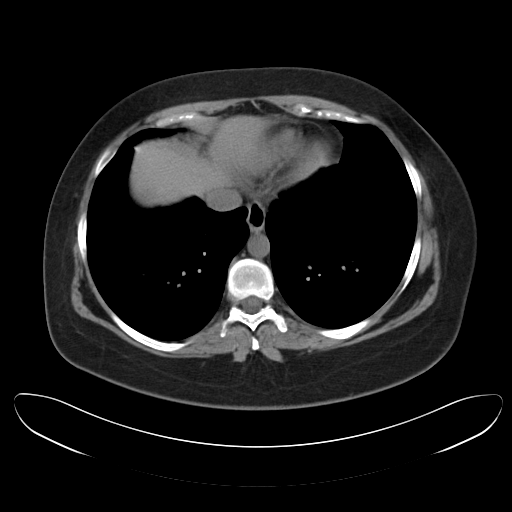
[im 96/101  soft-tissue]
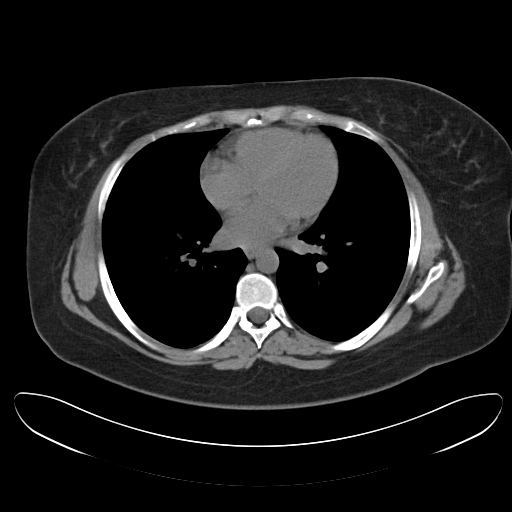

[Series 5: cor routine abd pel wo · coronal · 0.83mm/px · 3 of 127 slices shown]
[im 43/127  soft-tissue]
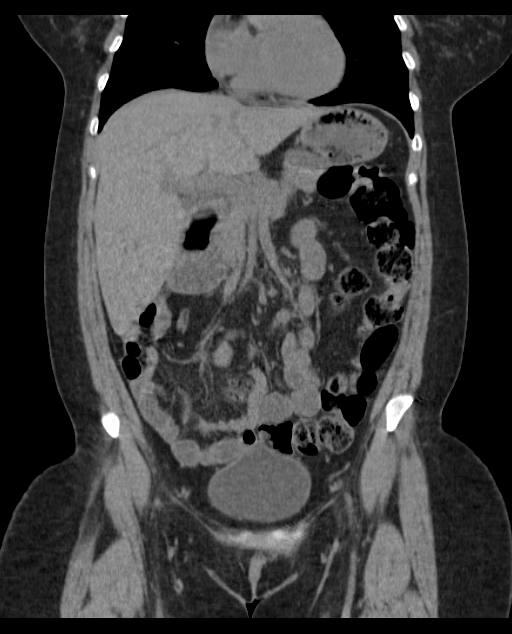
[im 57/127  soft-tissue]
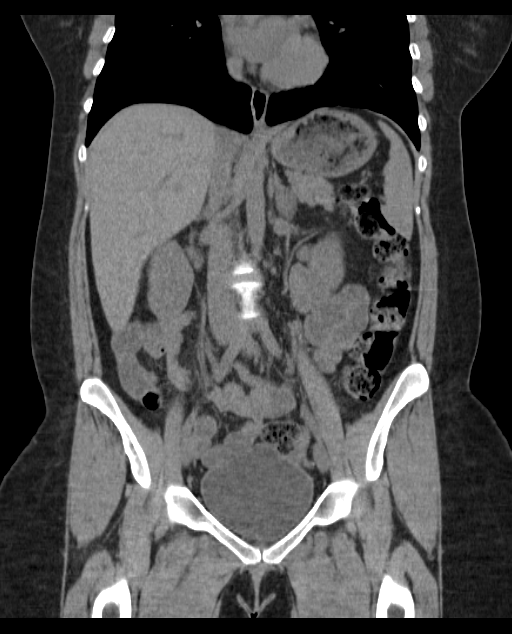
[im 71/127  soft-tissue]
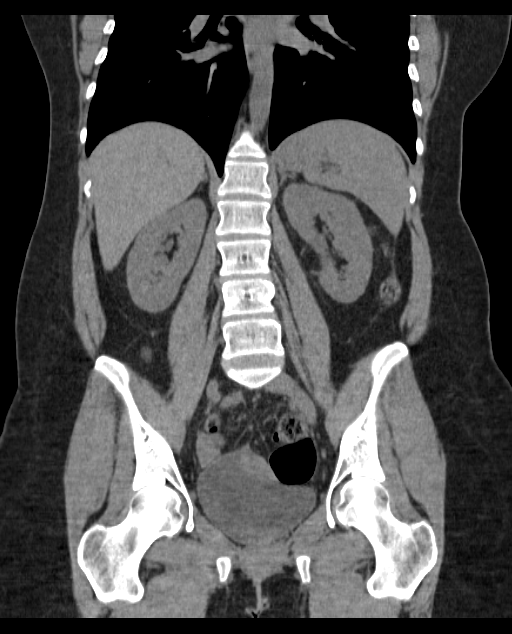

[16 of 46 positions shown; findings below may reference images not displayed]

FINDINGS: BODY WALL: Postoperative midline abdominal wall laxity.

LOWER CHEST: Benign-appearing subpleural nodule in the right lower
lobe, likely lymph node.

ABDOMEN/PELVIS:

Liver: No focal abnormality.

Biliary: No evidence of biliary obstruction or stone.

Pancreas: Unremarkable.

Spleen: Unremarkable.

Adrenals: Unremarkable.

Kidneys and ureters: No hydronephrosis or stone.

Bladder: Unremarkable.

Reproductive: No pathologic findings.

Bowel: There are 2 ingested safety pins. 1 of the pins is opened
based on previous abdominal radiograph, but not demonstrable on this
study. The pins are within epigastric region small bowel which are
nonobstructive and show no evidence of thickening. There is no
evidence of bowel perforation or pneumatosis. It is difficult to
determine how much if any progression there has been since the
previous study. Redundant colon with prominent stool retention.

Retroperitoneum: No mass or adenopathy.

Peritoneum: No ascites or pneumoperitoneum.

Vascular: No acute abnormality.

OSSEOUS: No acute abnormalities.  Bilateral L5 pars defects.
IMPRESSION: Two safety pins reside within small bowel located in the
epigastrium. One of the pins is open based on radiography. There is
no bowel obstruction or pneumoperitoneum.

## 2014-08-17 IMAGING — CR DG ABDOMEN 1V
1 series · 1 of 1 positions shown · non-contrast
Comparison: Abdominal radiographs [DATE]

CLINICAL DATA: Evaluate for foreign body ingestion.

EXAM:
ABDOMEN - 1 VIEW

[dxr kidney ureter bladder]
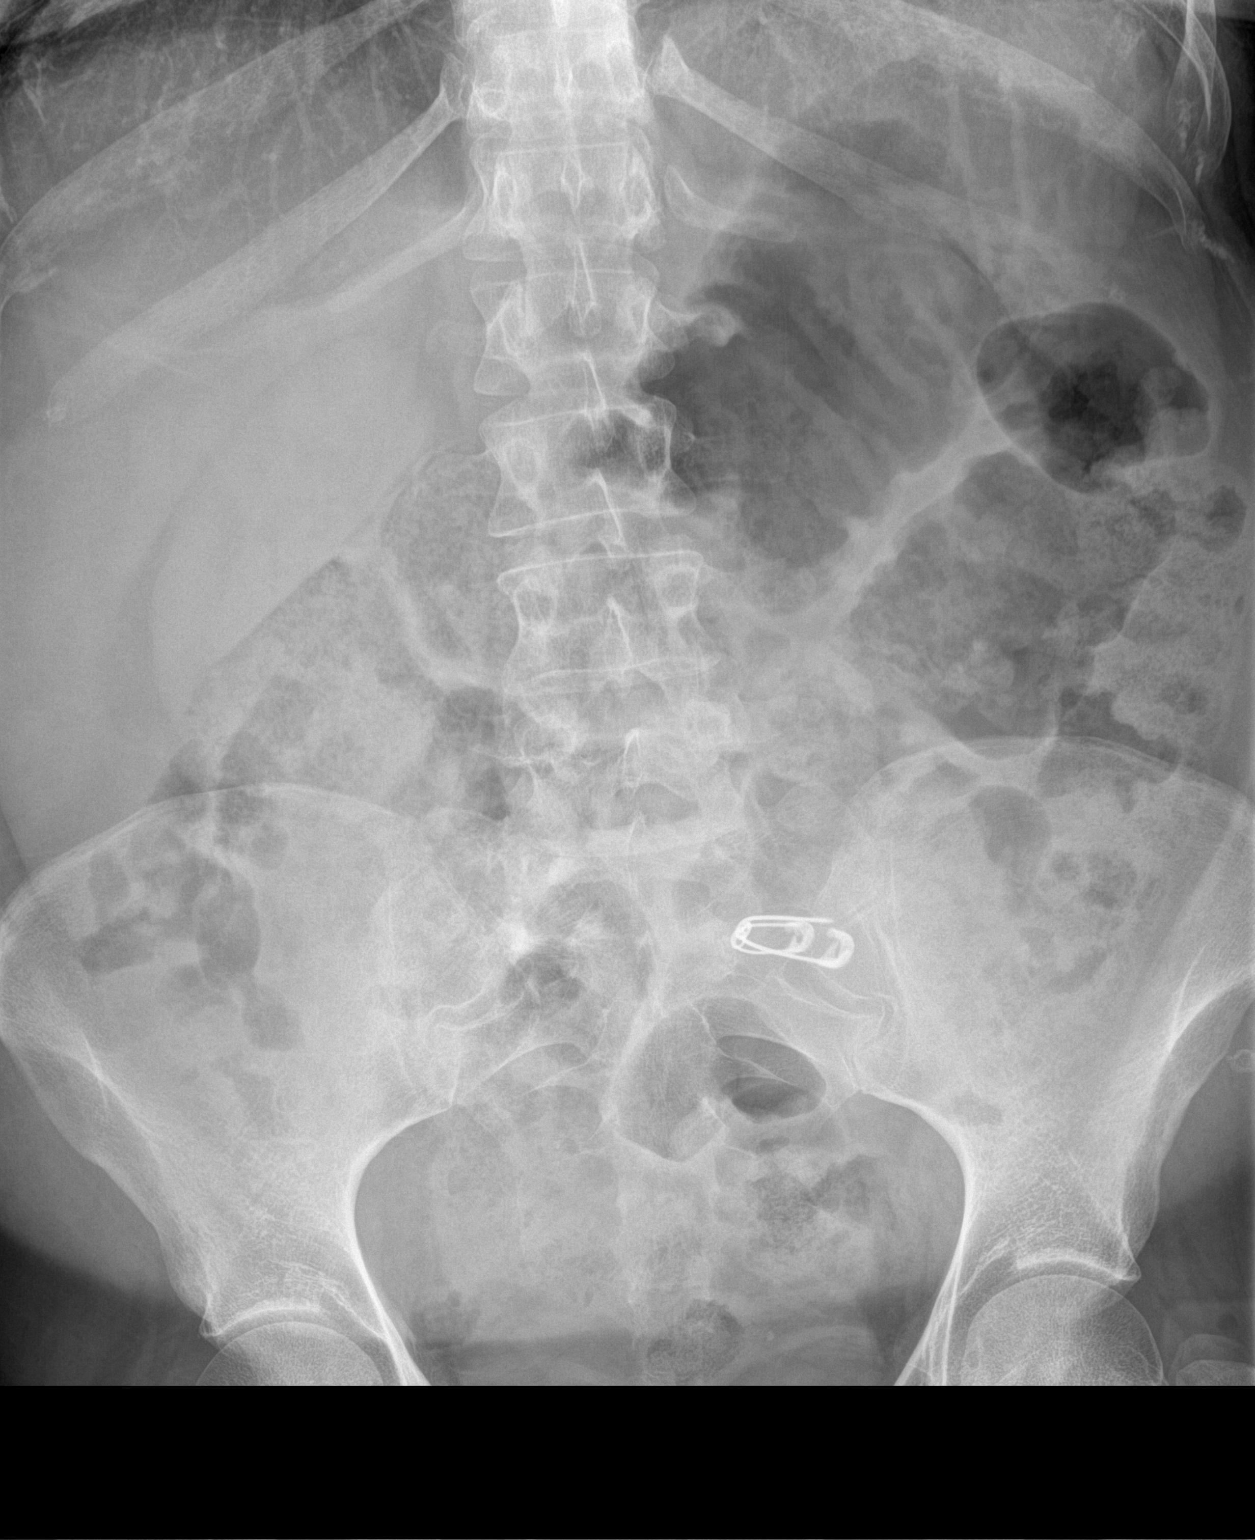

[1 of 1 positions shown; findings below may reference images not displayed]

FINDINGS: Overlying the left lower quadrant is a safety pin which appears to
be open. Large amount of stool demonstrated throughout the colon.
The colon is mildly gaseous distended. No small bowel dilatation.
Lumbar spine degenerative change.
IMPRESSION: Safety pin projects over the left lower quadrant, most compatible
with ingested foreign body.

Stool throughout the colon as can be seen with constipation.

## 2014-08-18 ENCOUNTER — Inpatient Hospital Stay
Admit: 2014-08-18 | Disposition: A | Discharge status: Home / Self Care | Payer: Self-pay | Attending: Surgery | Admitting: Surgery

## 2014-08-18 IMAGING — CR DG ABDOMEN 1V
1 series · 1 of 1 positions shown · non-contrast
Comparison: CT [DATE].  KUB [DATE].

CLINICAL DATA: Foreign body.

EXAM:
ABDOMEN - 1 VIEW

[dxr kidney ureter bladder]
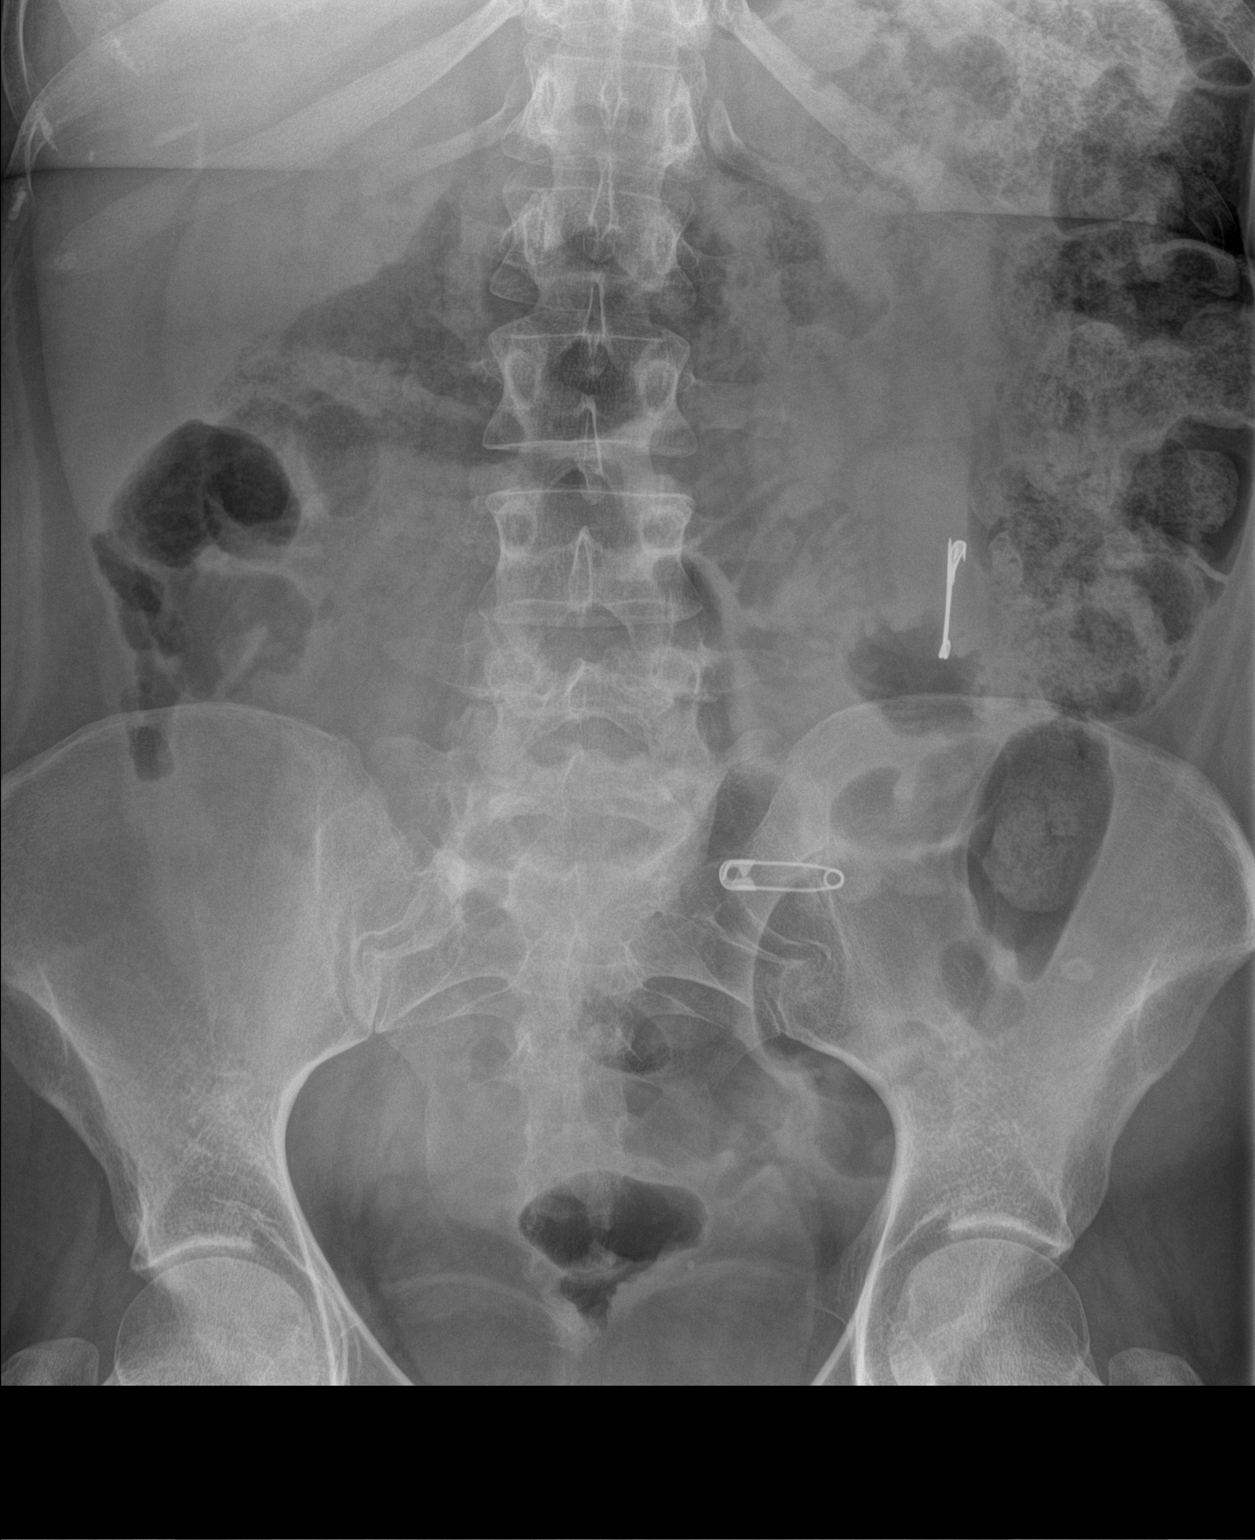

[1 of 1 positions shown; findings below may reference images not displayed]

FINDINGS: Two safety pins are again noted. These are projected over the left
mid abdomen. The uppermost pin appears open. No evidence of bowel
obstruction or free air. No acute bony abnormality .
IMPRESSION: 2 safety pins again noted. These are present in the left mid
abdomen. The uppermost pin appears open.

## 2014-08-19 LAB — CBC WITH DIFFERENTIAL/PLATELET
BASOS ABS: 0.1 10*3/uL (ref 0.0–0.1)
BASOS PCT: 0.9 %
EOS PCT: 5.5 %
Eosinophil #: 0.4 10*3/uL (ref 0.0–0.7)
HCT: 37.1 % (ref 35.0–47.0)
HGB: 12.2 g/dL (ref 12.0–16.0)
LYMPHS PCT: 31.4 %
Lymphocyte #: 2.1 10*3/uL (ref 1.0–3.6)
MCH: 30 pg (ref 26.0–34.0)
MCHC: 32.7 g/dL (ref 32.0–36.0)
MCV: 92 fL (ref 80–100)
MONOS PCT: 6.3 %
Monocyte #: 0.4 x10 3/mm (ref 0.2–0.9)
NEUTROS ABS: 3.7 10*3/uL (ref 1.4–6.5)
Neutrophil %: 55.9 %
Platelet: 193 10*3/uL (ref 150–440)
RBC: 4.06 10*6/uL (ref 3.80–5.20)
RDW: 13.3 % (ref 11.5–14.5)
WBC: 6.7 10*3/uL (ref 3.6–11.0)

## 2014-08-19 LAB — COMPREHENSIVE METABOLIC PANEL
ALBUMIN: 3.7 g/dL
ALT: 7 U/L — AB
AST: 13 U/L — AB
Alkaline Phosphatase: 74 U/L
Anion Gap: 5 — ABNORMAL LOW (ref 7–16)
BUN: 6 mg/dL
Bilirubin,Total: 0.4 mg/dL
CREATININE: 0.62 mg/dL
Calcium, Total: 9.3 mg/dL
Chloride: 108 mmol/L
Co2: 25 mmol/L
EGFR (Non-African Amer.): >60
GLUCOSE: 98 mg/dL
Potassium: 4 mmol/L
Sodium: 138 mmol/L
TOTAL PROTEIN: 6.7 g/dL

## 2014-08-19 IMAGING — CR DG ABDOMEN 2V
1 series · 3 of 3 positions shown · non-contrast
Comparison: [DATE]

CLINICAL DATA: Followup foreign body in bowel.

EXAM:
ABDOMEN - 2 VIEW

[Series 1: dxr abdomen 2 v flat and erect · 0.14mm/px · 3 of 3 slices shown]
[im 1/3]
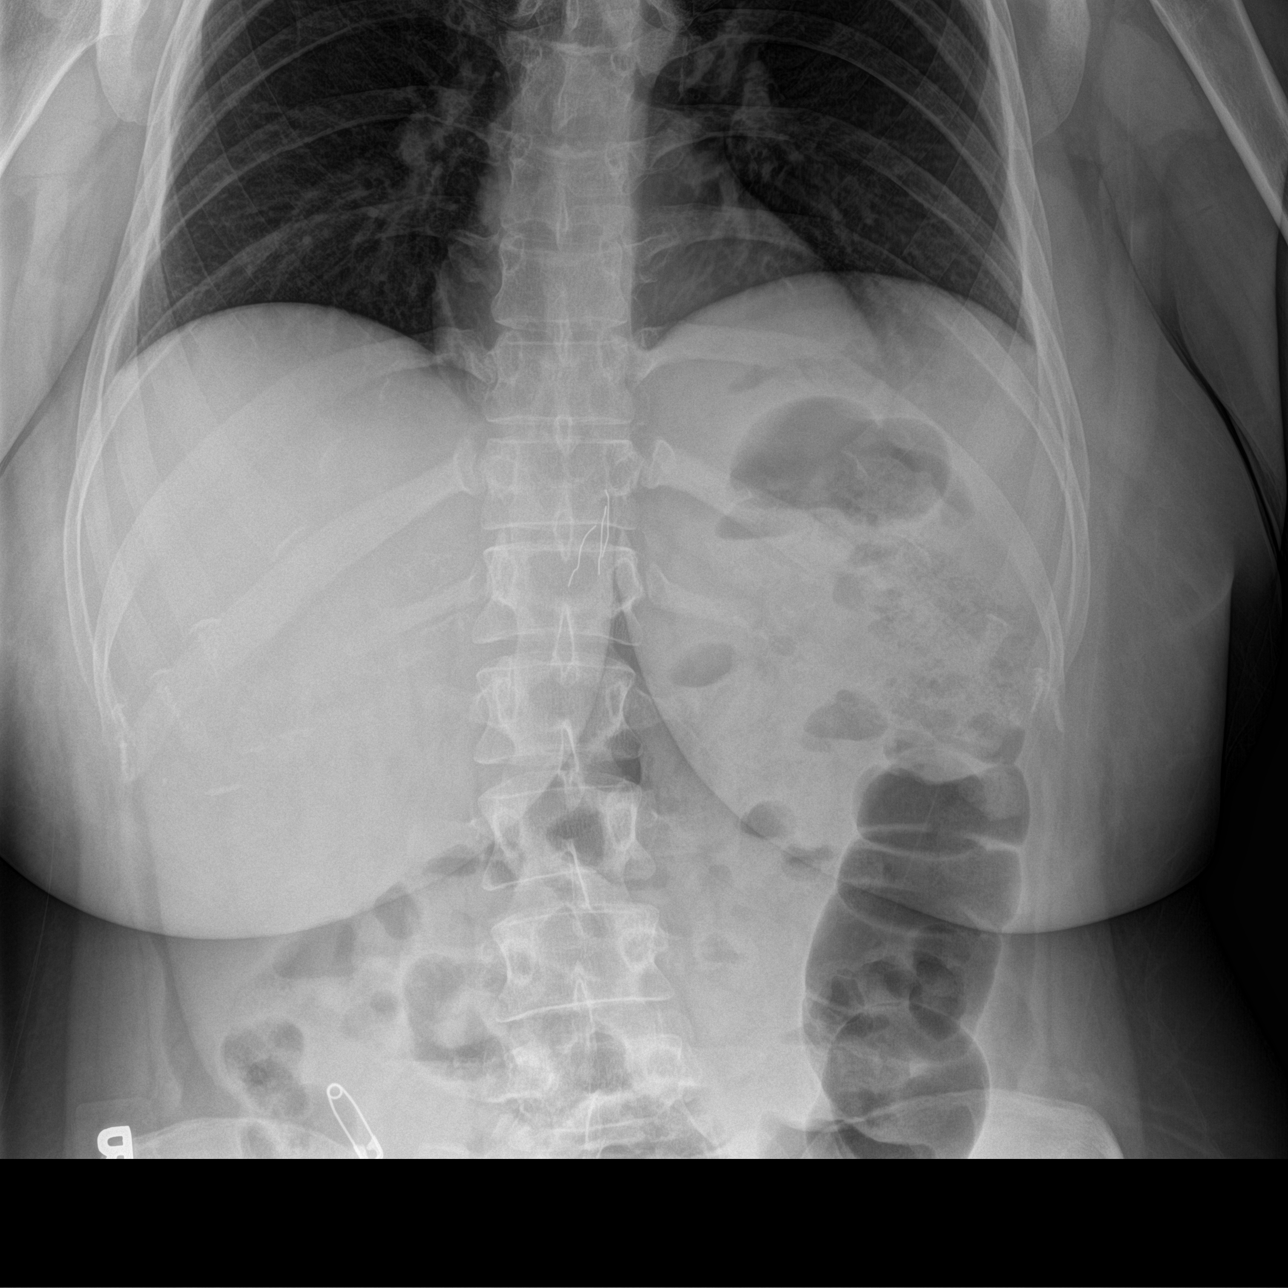
[im 2/3]
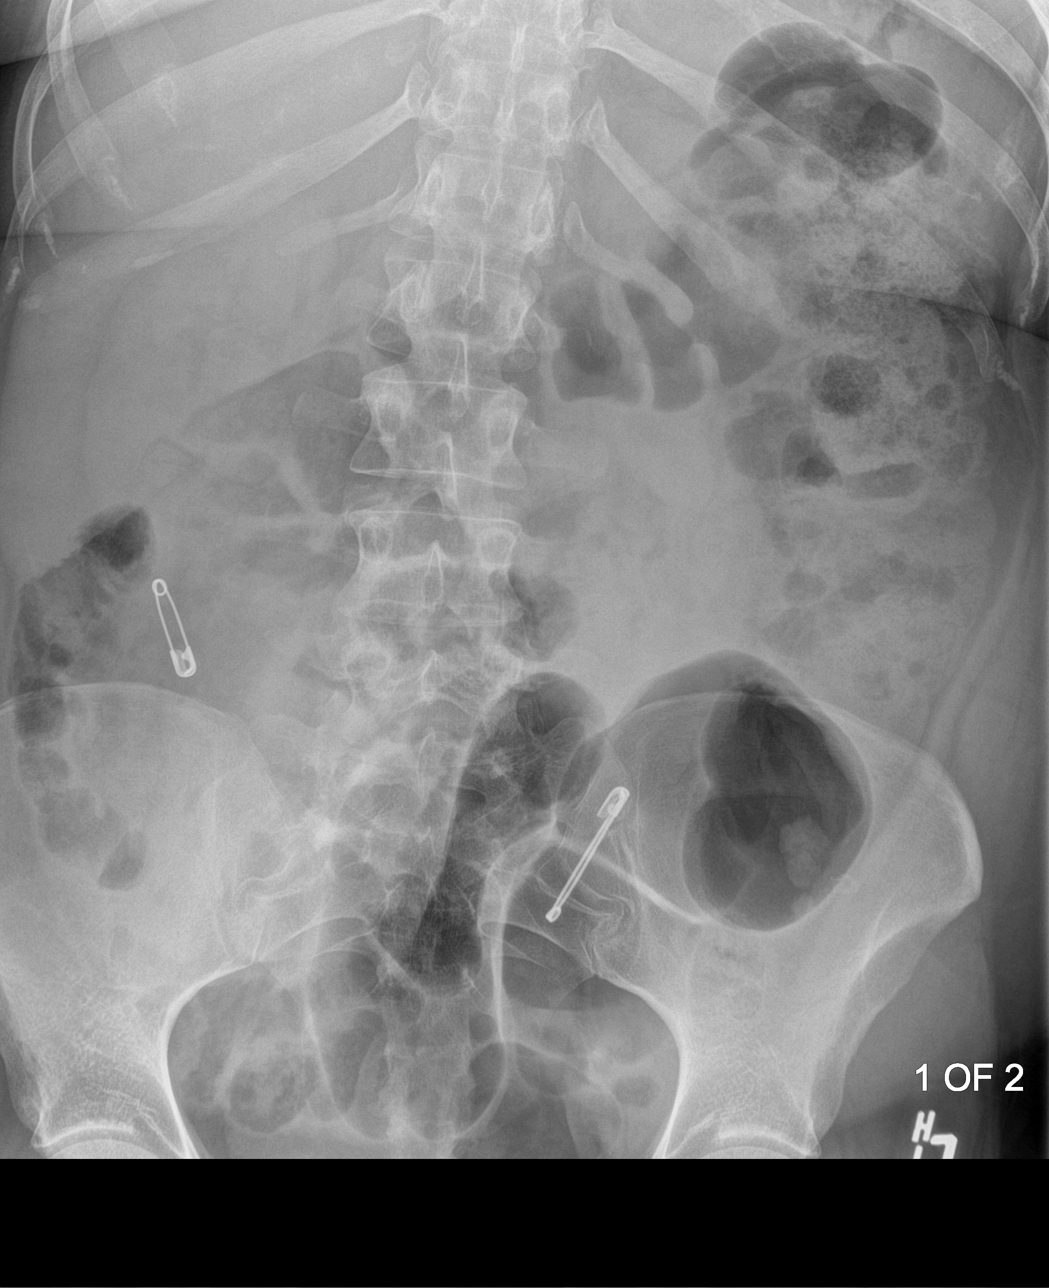
[im 3/3]
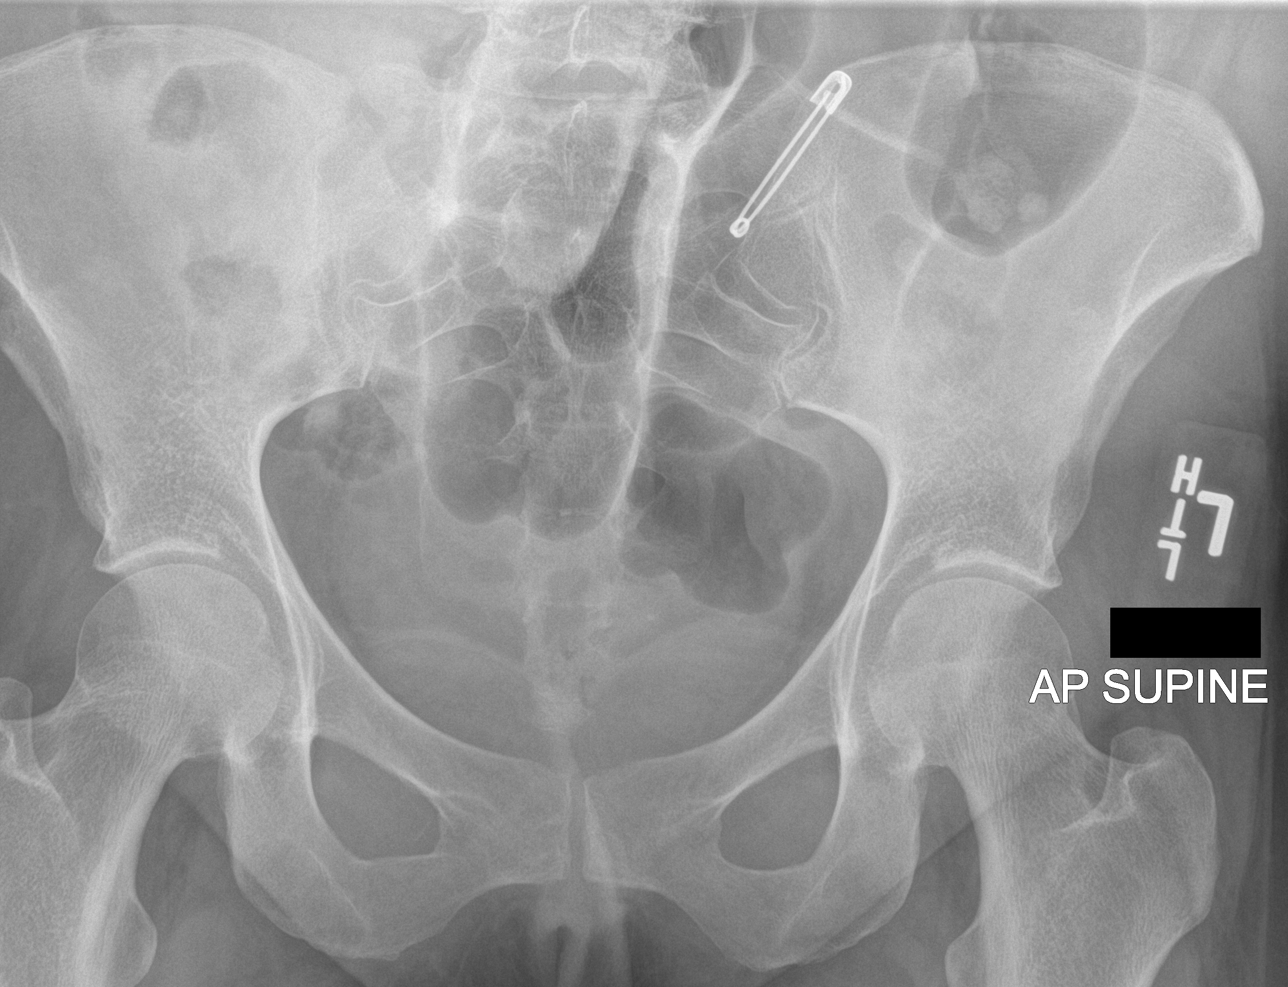

[3 of 3 positions shown; findings below may reference images not displayed]

FINDINGS: Bowel gas pattern is nonobstructive. Mild fecal retention of the
left colon. Again noted are 2 foreign bodies compatible with
ingested safety pins. The closed safety pin is located over the
right lower quadrant likely within small bowel. The open safety pin
is located over the left lower quadrant which may be within small
bowel versus sigmoid colon. No evidence of free peritoneal air.
Remainder the exam is unchanged.
IMPRESSION: Findings compatible with history of ingested metallic foreign bodies
compatible with safety pins with 1 open and 1 closed as described.
No evidence of obstruction. No free peritoneal air.

## 2014-08-20 IMAGING — CR DG ABDOMEN 2V
1 series · 3 of 3 positions shown · non-contrast
Comparison: Plain films of the abdomen [DATE], [DATE] and
[DATE].

CLINICAL DATA: The patient swallowed a safety pins on [DATE].
Abdominal pain.

EXAM:
ABDOMEN - 2 VIEW

[Series 1: dxr abdomen 2 v flat and erect · 0.14mm/px · 3 of 3 slices shown]
[im 1/3]
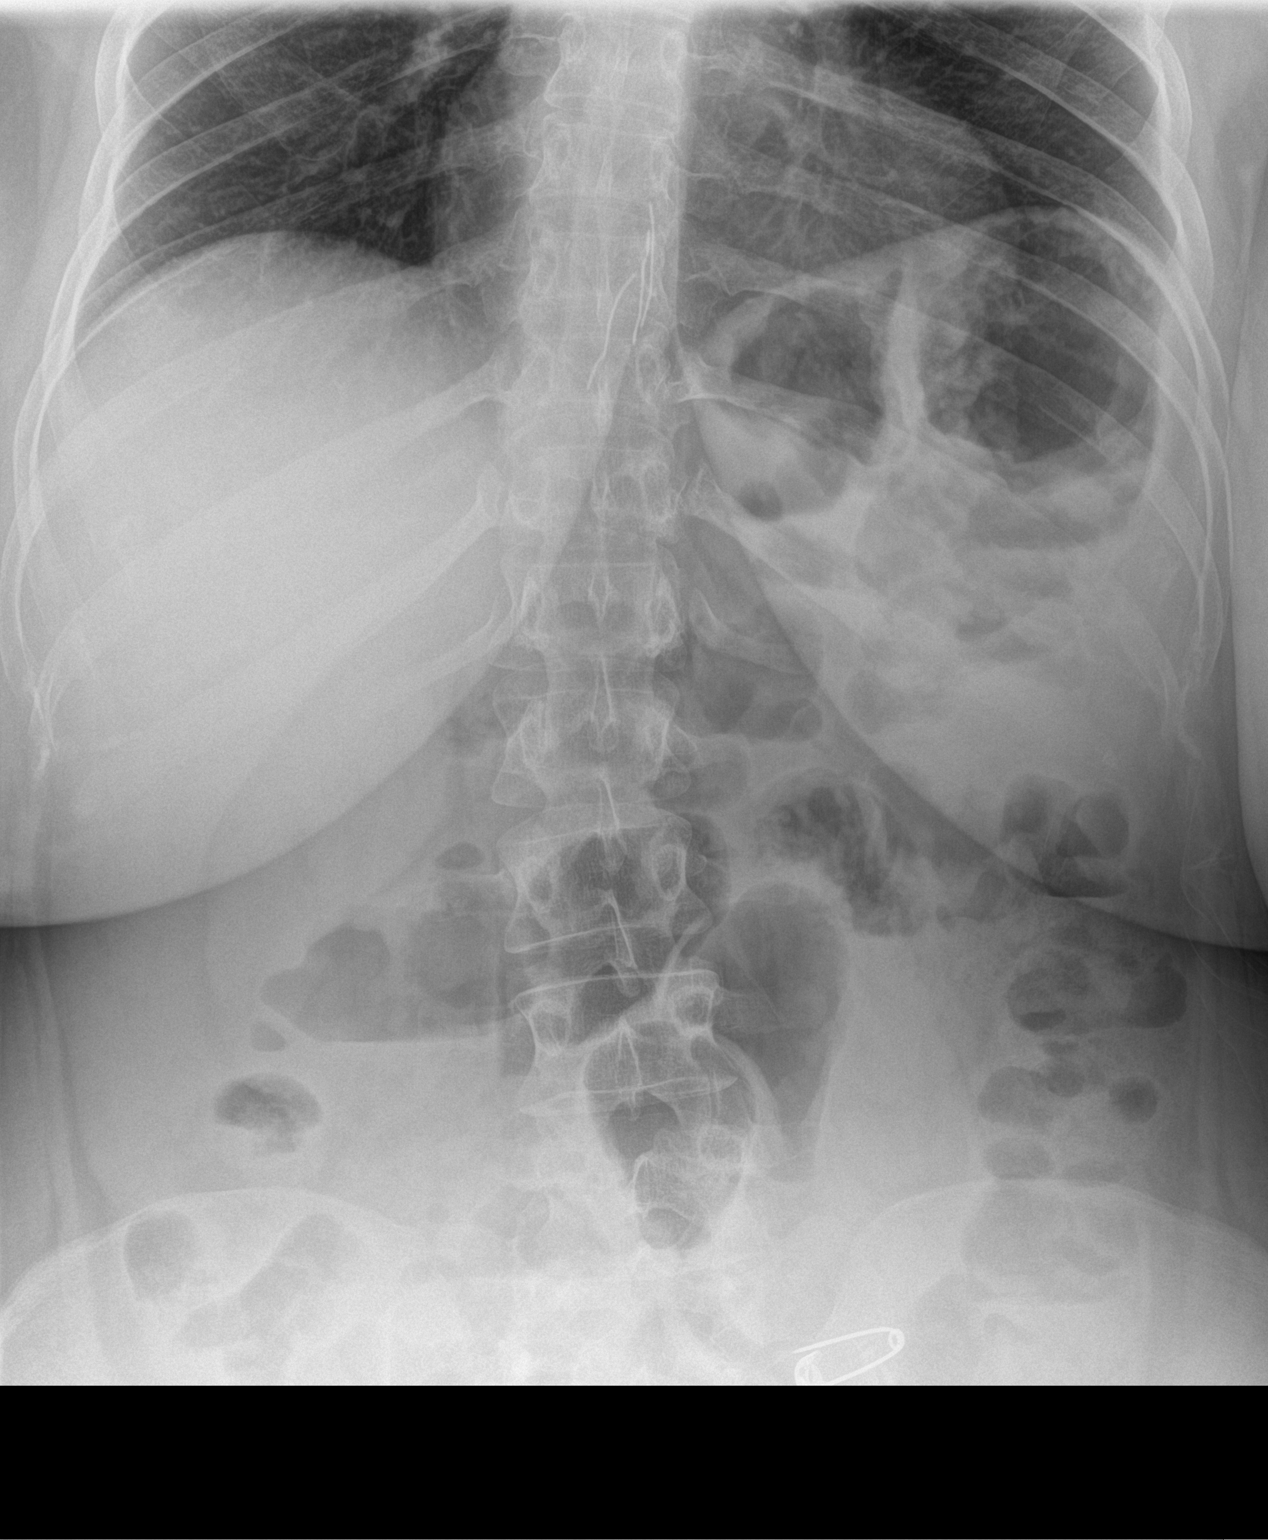
[im 2/3]
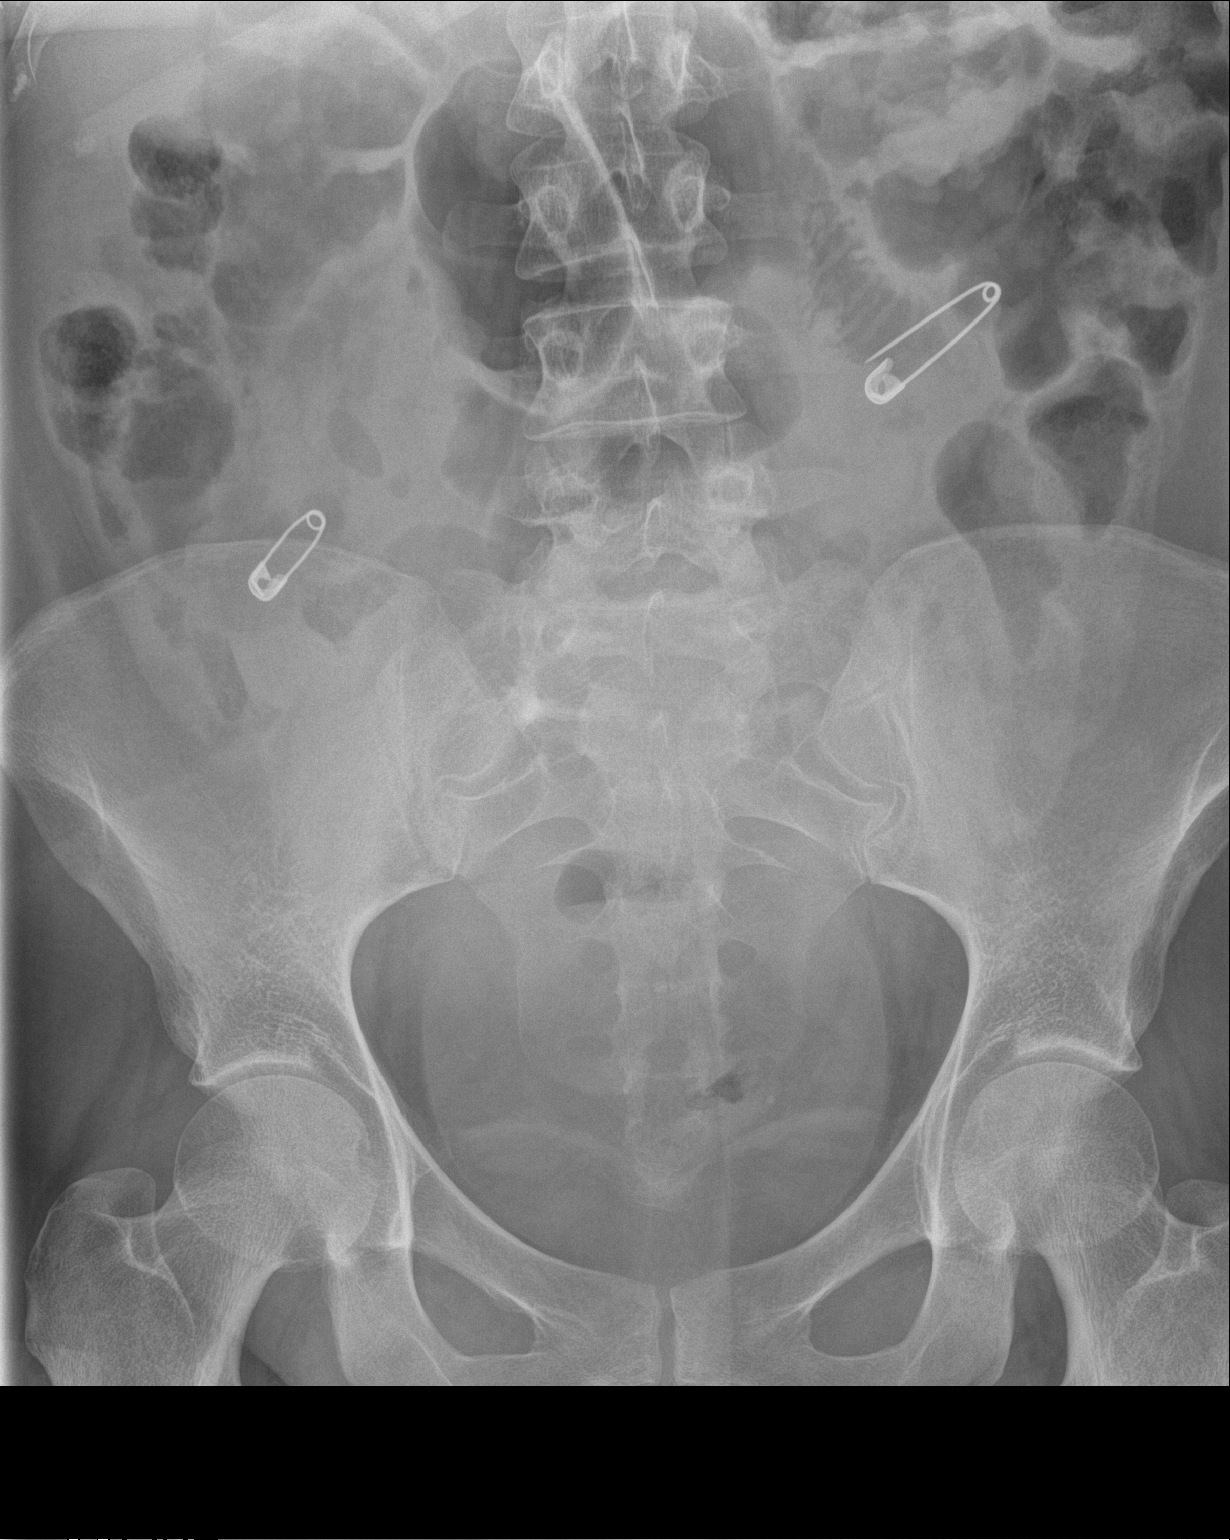
[im 3/3]
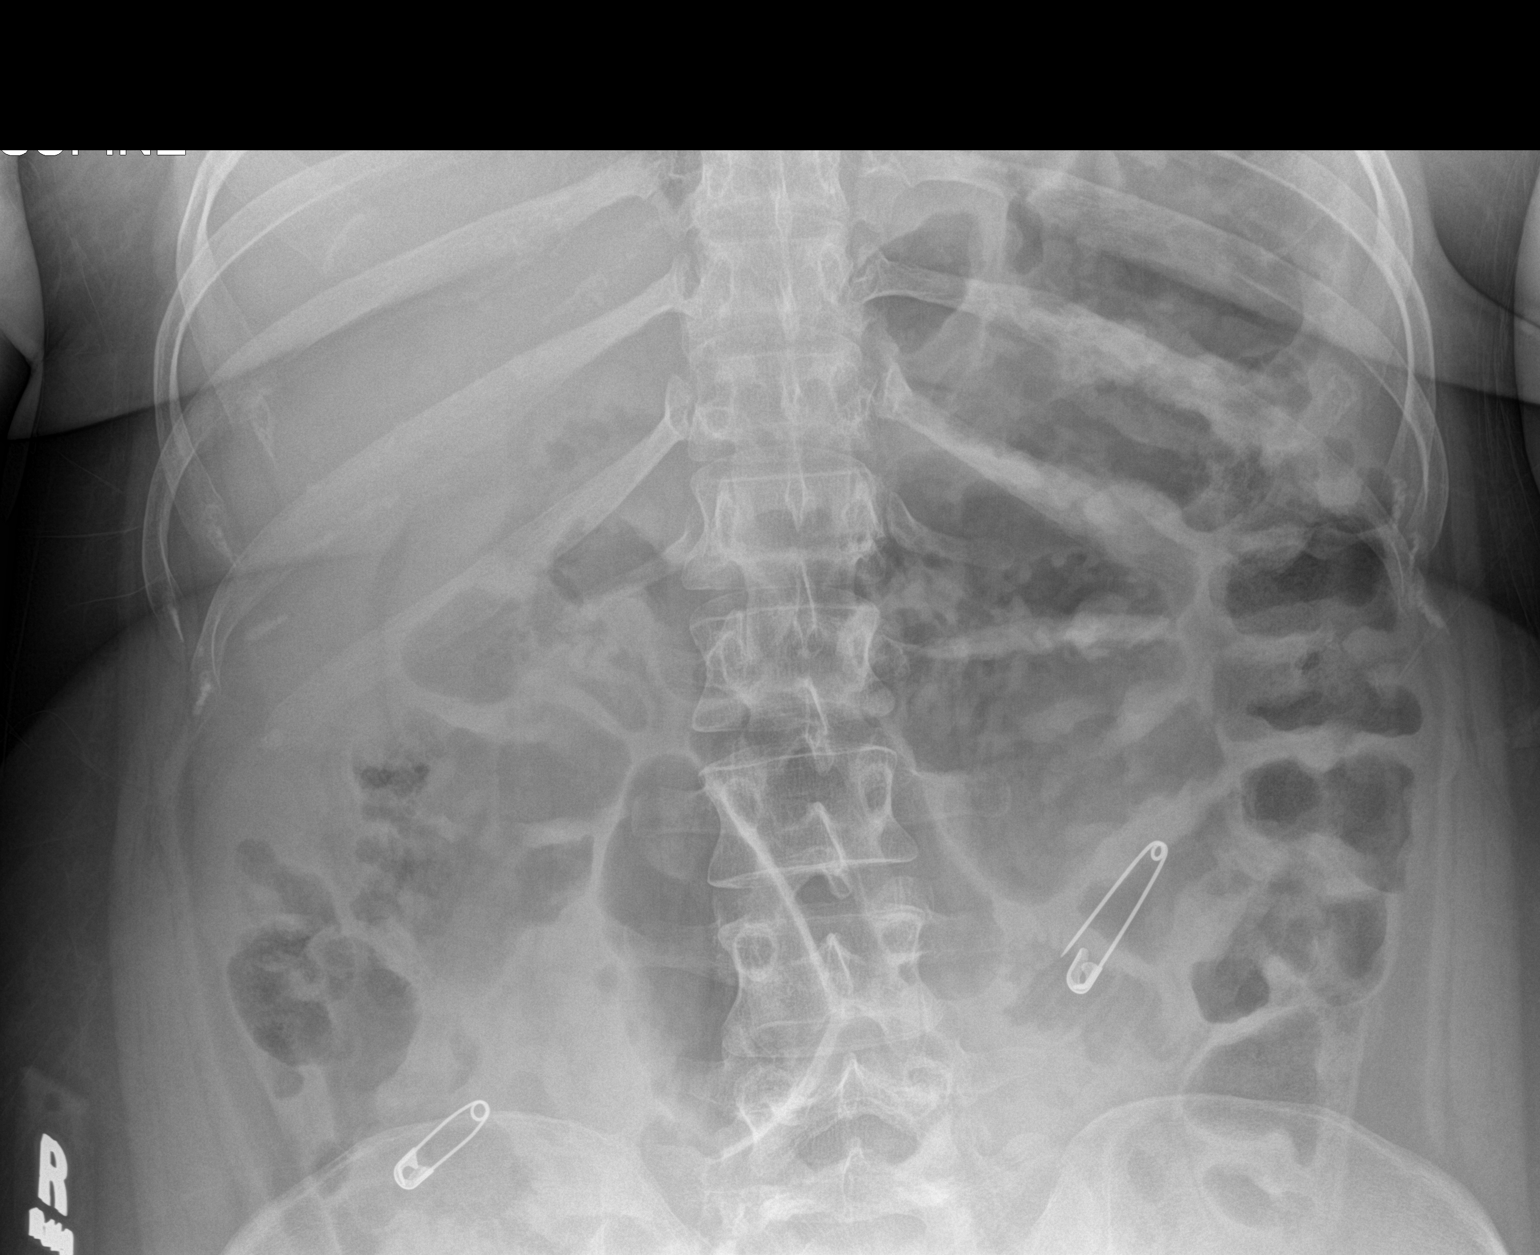

[3 of 3 positions shown; findings below may reference images not displayed]

FINDINGS: Two safety pins are again identified. The exact location cannot be
determined but the closed safety pin projects in the right lower
quadrant and is likely within distal small bowel, unchanged. The
second, open safety pin projects in the left mid abdomen in small
bowel and appears to have progressed somewhat.
IMPRESSION: Closed safety pin in the right lower quadrant appears unchanged in
position compared to the most recent exam. Second, open safety pin
in the left mid abdomen appears to have progressed slightly through
the small bowel.

## 2014-08-21 IMAGING — CR DG ABDOMEN 1V
1 series · 1 of 1 positions shown · non-contrast
Comparison: [DATE] as well as other radiographs and CT
examinations dating back to [DATE].

CLINICAL DATA: Abdominal pain and swallowed safety pins.

EXAM:
ABDOMEN - 1 VIEW

[dxr abdomen ap only]
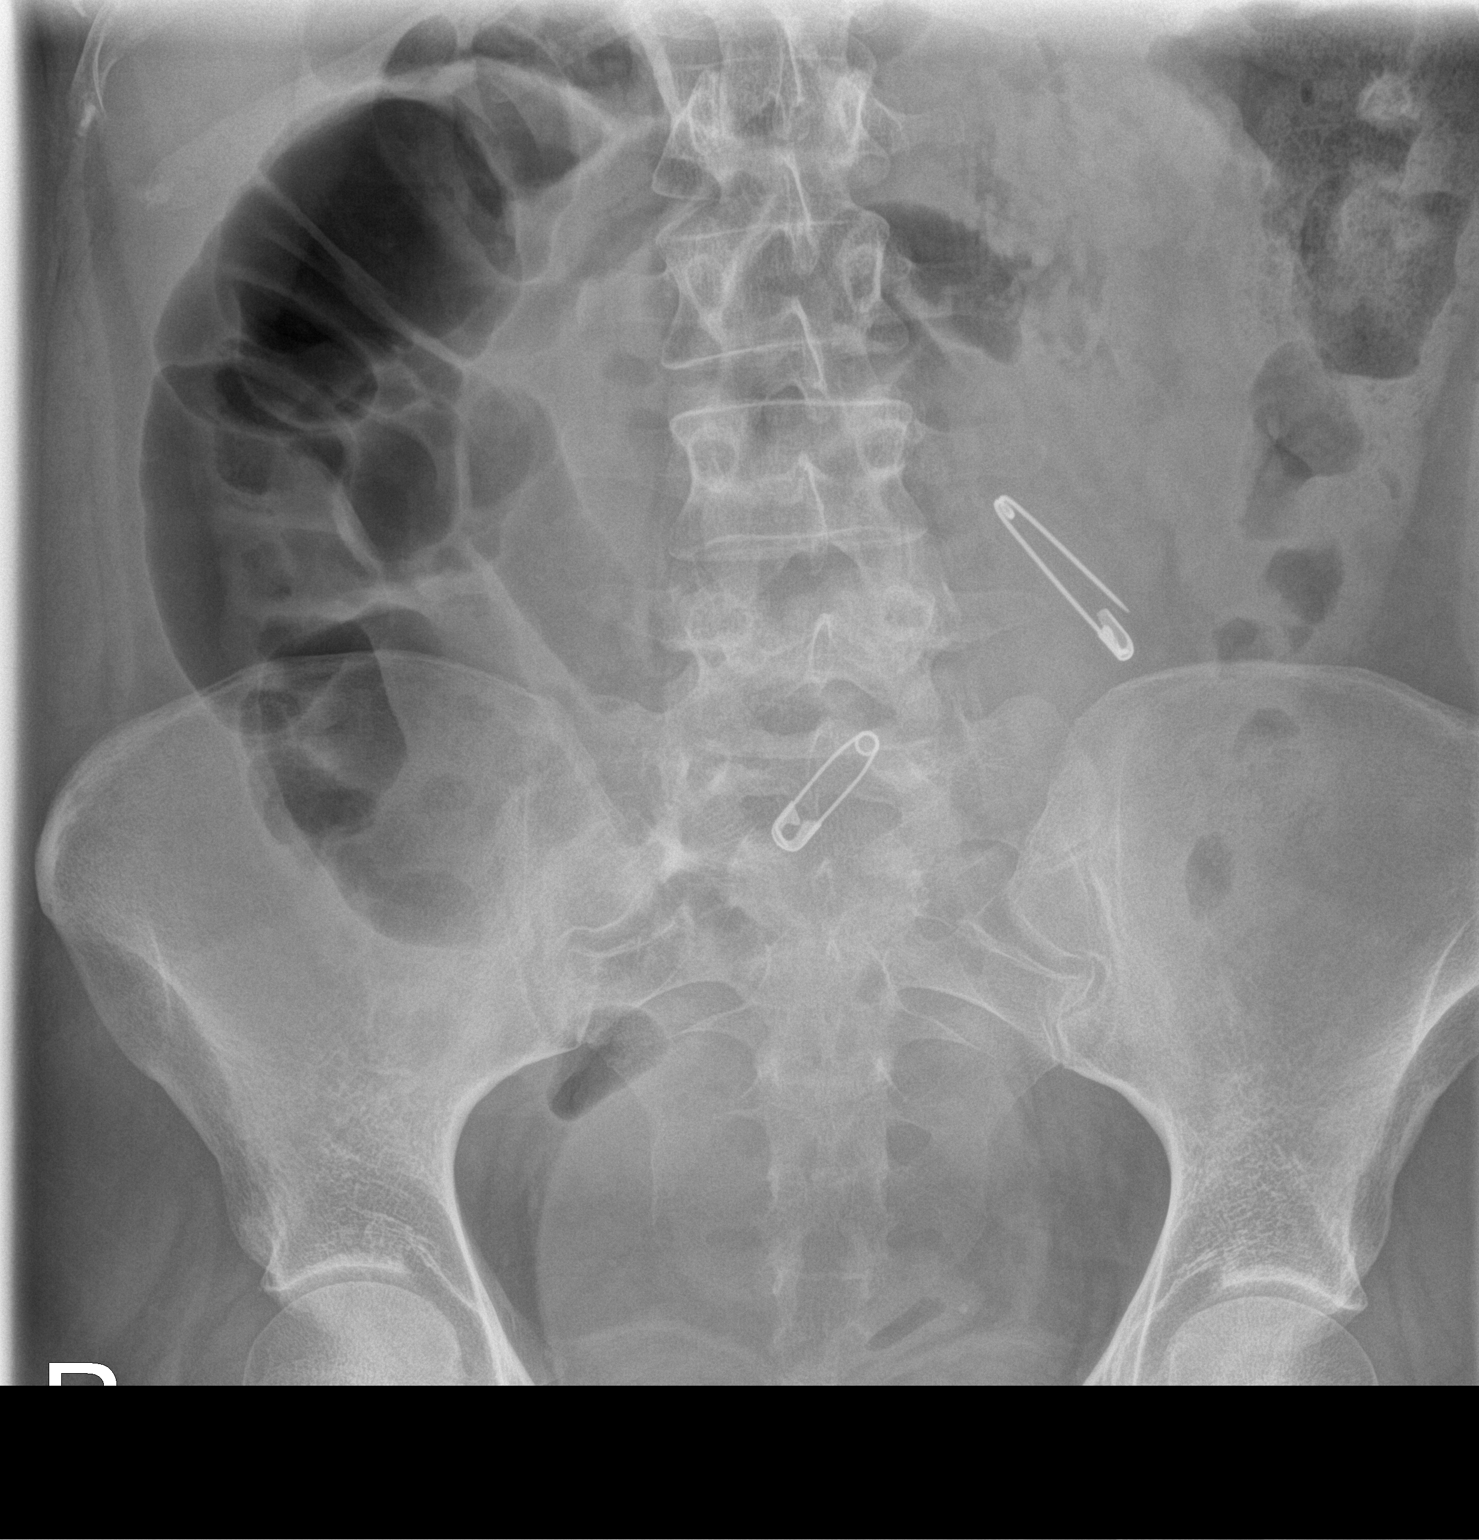

[1 of 1 positions shown; findings below may reference images not displayed]

FINDINGS: The open safety pin shows no significant change in positioning and
likely is located in the jejunum. The closed safety pin lies in the
midline at the L5 level. In this location, this may be located in
more distal small bowel or potentially also sigmoid colon. Small
bowel positioning is favored. No associated small bowel obstruction.
No gross signs of free air on the supine film.
IMPRESSION: The open safety pin lies in the left mid abdomen in a stable
position, likely within the jejunum. The closed safety pin lies in
the midline and is favored to be within distal small bowel.

## 2014-08-22 IMAGING — CR DG ABDOMEN 2V
1 series · 3 of 3 positions shown · non-contrast
Comparison: [DATE] and earlier.

CLINICAL DATA: 24-year-old female with left lower quadrant pain,
swallowed 2 safety pens 5 days ago. Initial encounter.

EXAM:
ABDOMEN - 2 VIEW

[Series 1: dxr abdomen 2 v flat and erect · 0.14mm/px · 3 of 3 slices shown]
[im 1/3]
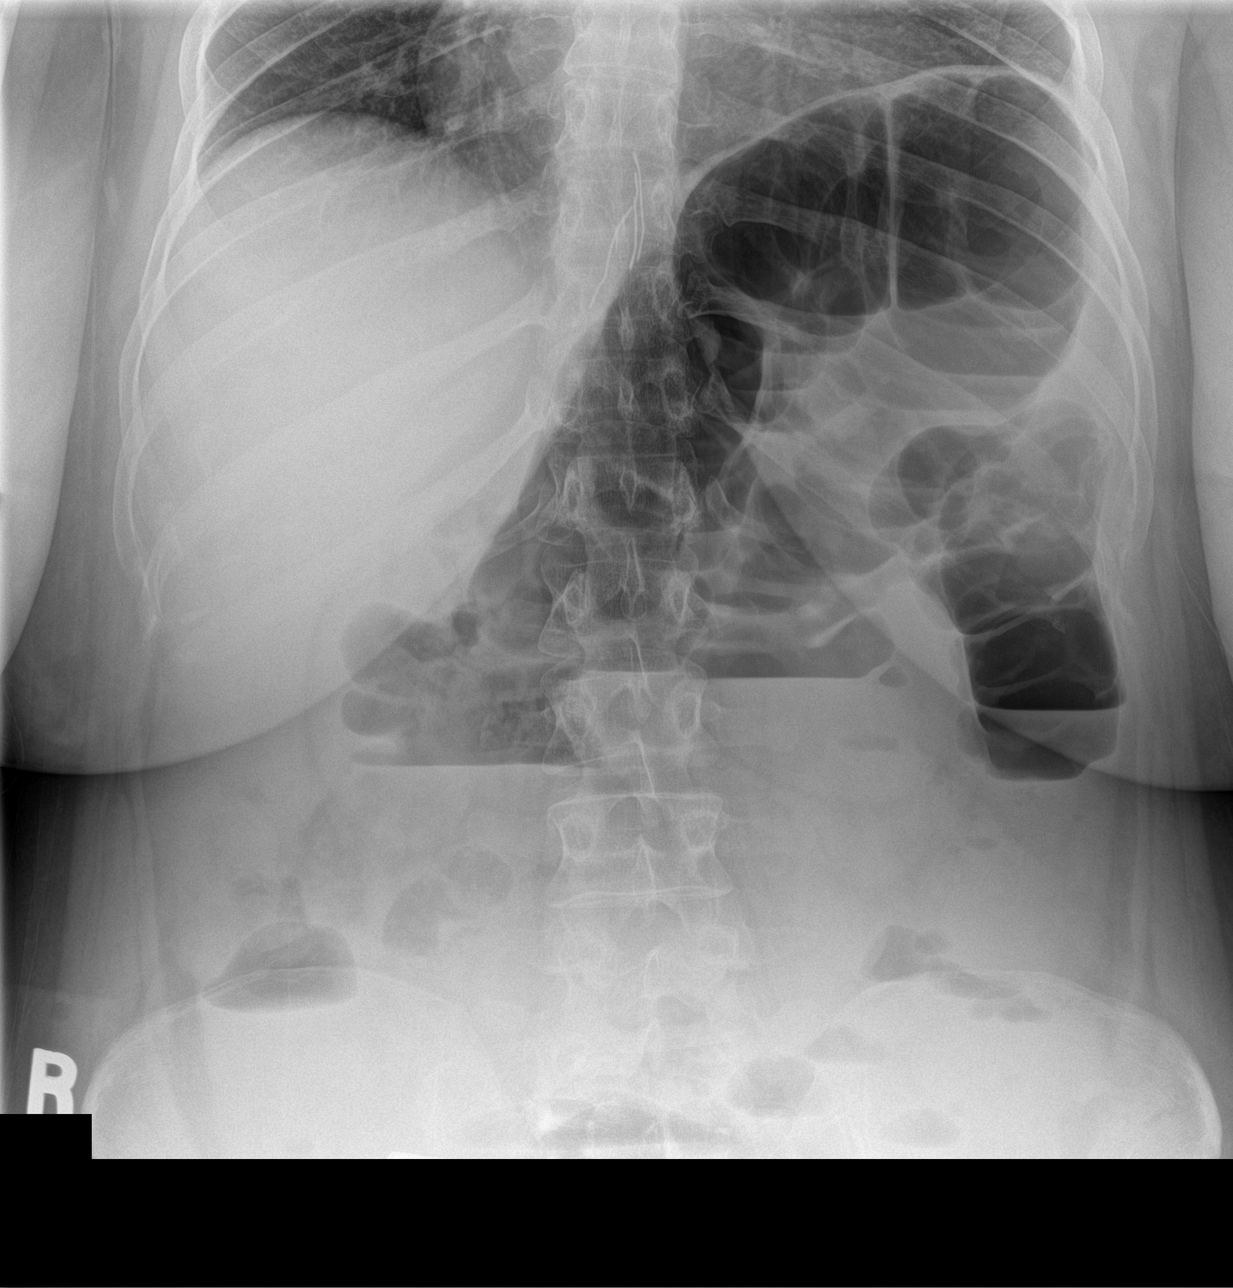
[im 2/3]
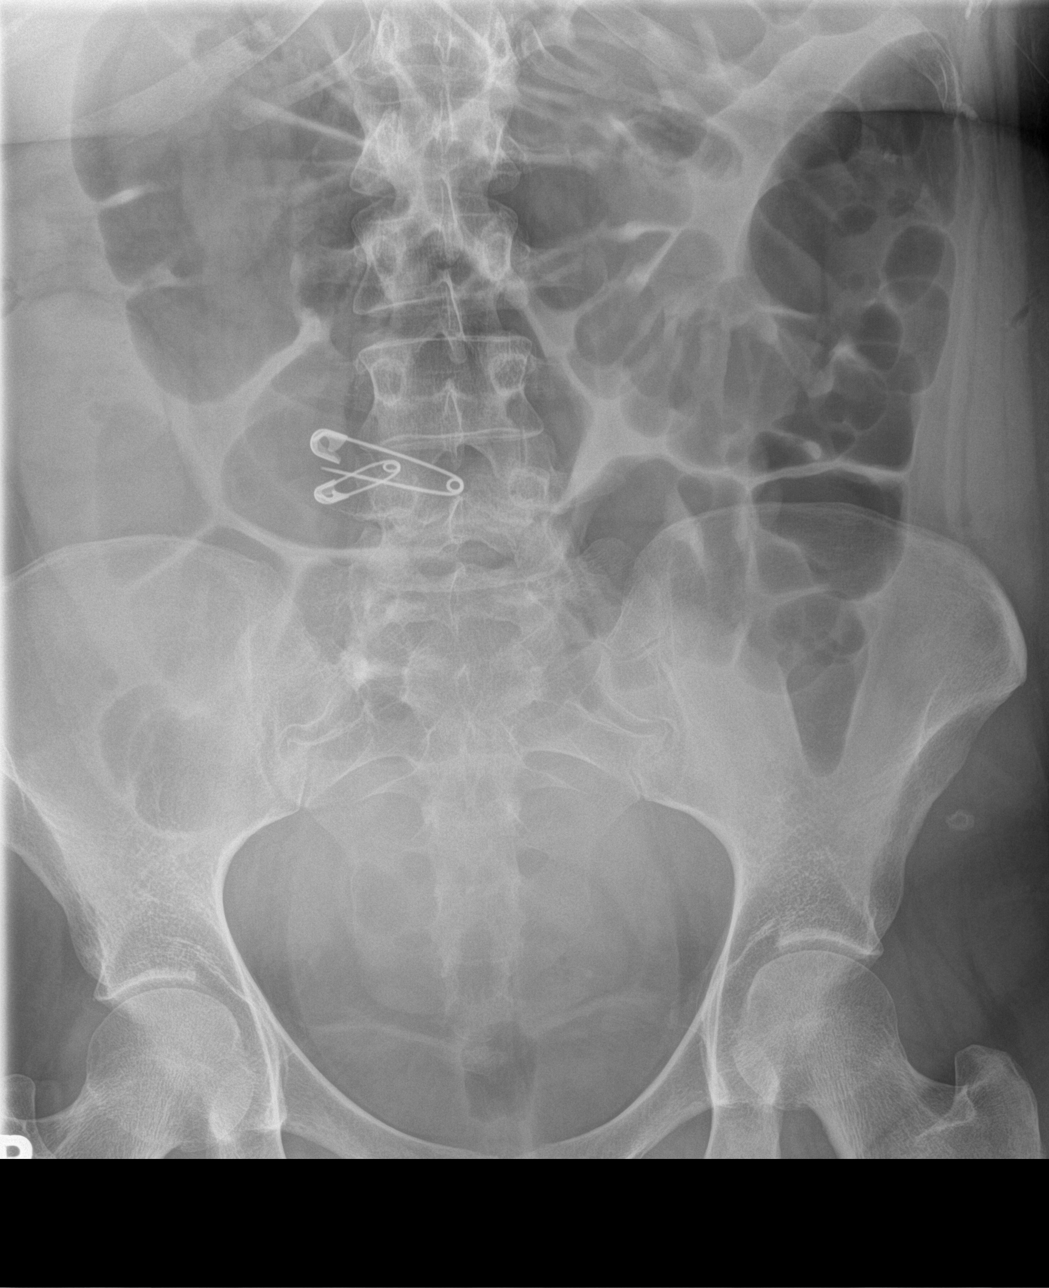
[im 3/3]
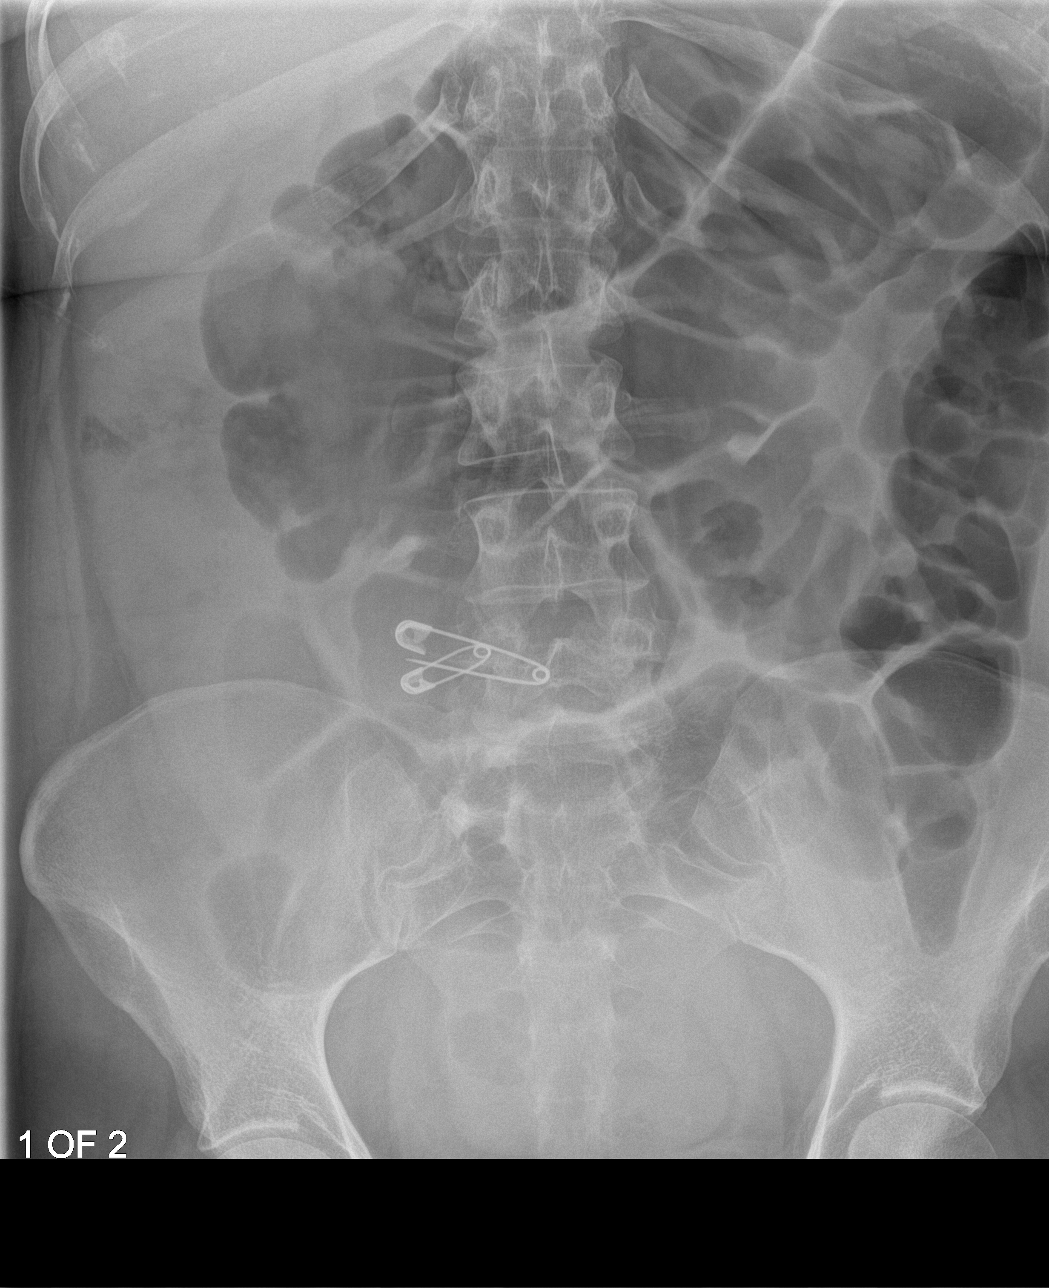

[3 of 3 positions shown; findings below may reference images not displayed]

FINDINGS: Upright and supine views of the abdomen and pelvis. No
pneumoperitoneum. Negative lung bases.

There are 3 small linear metallic foci which project in the midline
near the region of the diaphragm. These are stable since [DATE],
but not identified on the CT Abdomen and Pelvis [DATE]. These
measure 3-3.5 cm in length each.

Once again to safety pins are re - identified in the abdomen. Both
now project in lower mid abdomen just to the right of midline.

There is increased large bowel gas from prior studies. No dilated
small bowel. Stable abdominal and pelvic visceral contours. Stable
visualized osseous structures.
IMPRESSION: 1. Three curvilinear metallic appearing wires project at the level
of the diaphragm over midline, stable since [DATE] but not
identified prior to that, including on the CT Abdomen and Pelvis
[DATE]. Could these be additional ingested or subcutaneous
foreign bodies?
2. Both safety pens now project in the lower abdomen just to the
right of midline.
3. No pneumoperitoneum. Increased large bowel gas but the bowel gas
pattern remains non obstructed.

## 2014-08-23 IMAGING — CR DG ABDOMEN 2V
1 series · 2 of 2 positions shown · non-contrast
Comparison: Radiograph 4 [DATE], CT [DATE]

CLINICAL DATA: Swallowed foreign body (safety pins)

EXAM:
ABDOMEN - 2 VIEW

[Series 1: dxr abdomen 2 v flat and erect · 0.14mm/px · 2 of 2 slices shown]
[im 1/2]
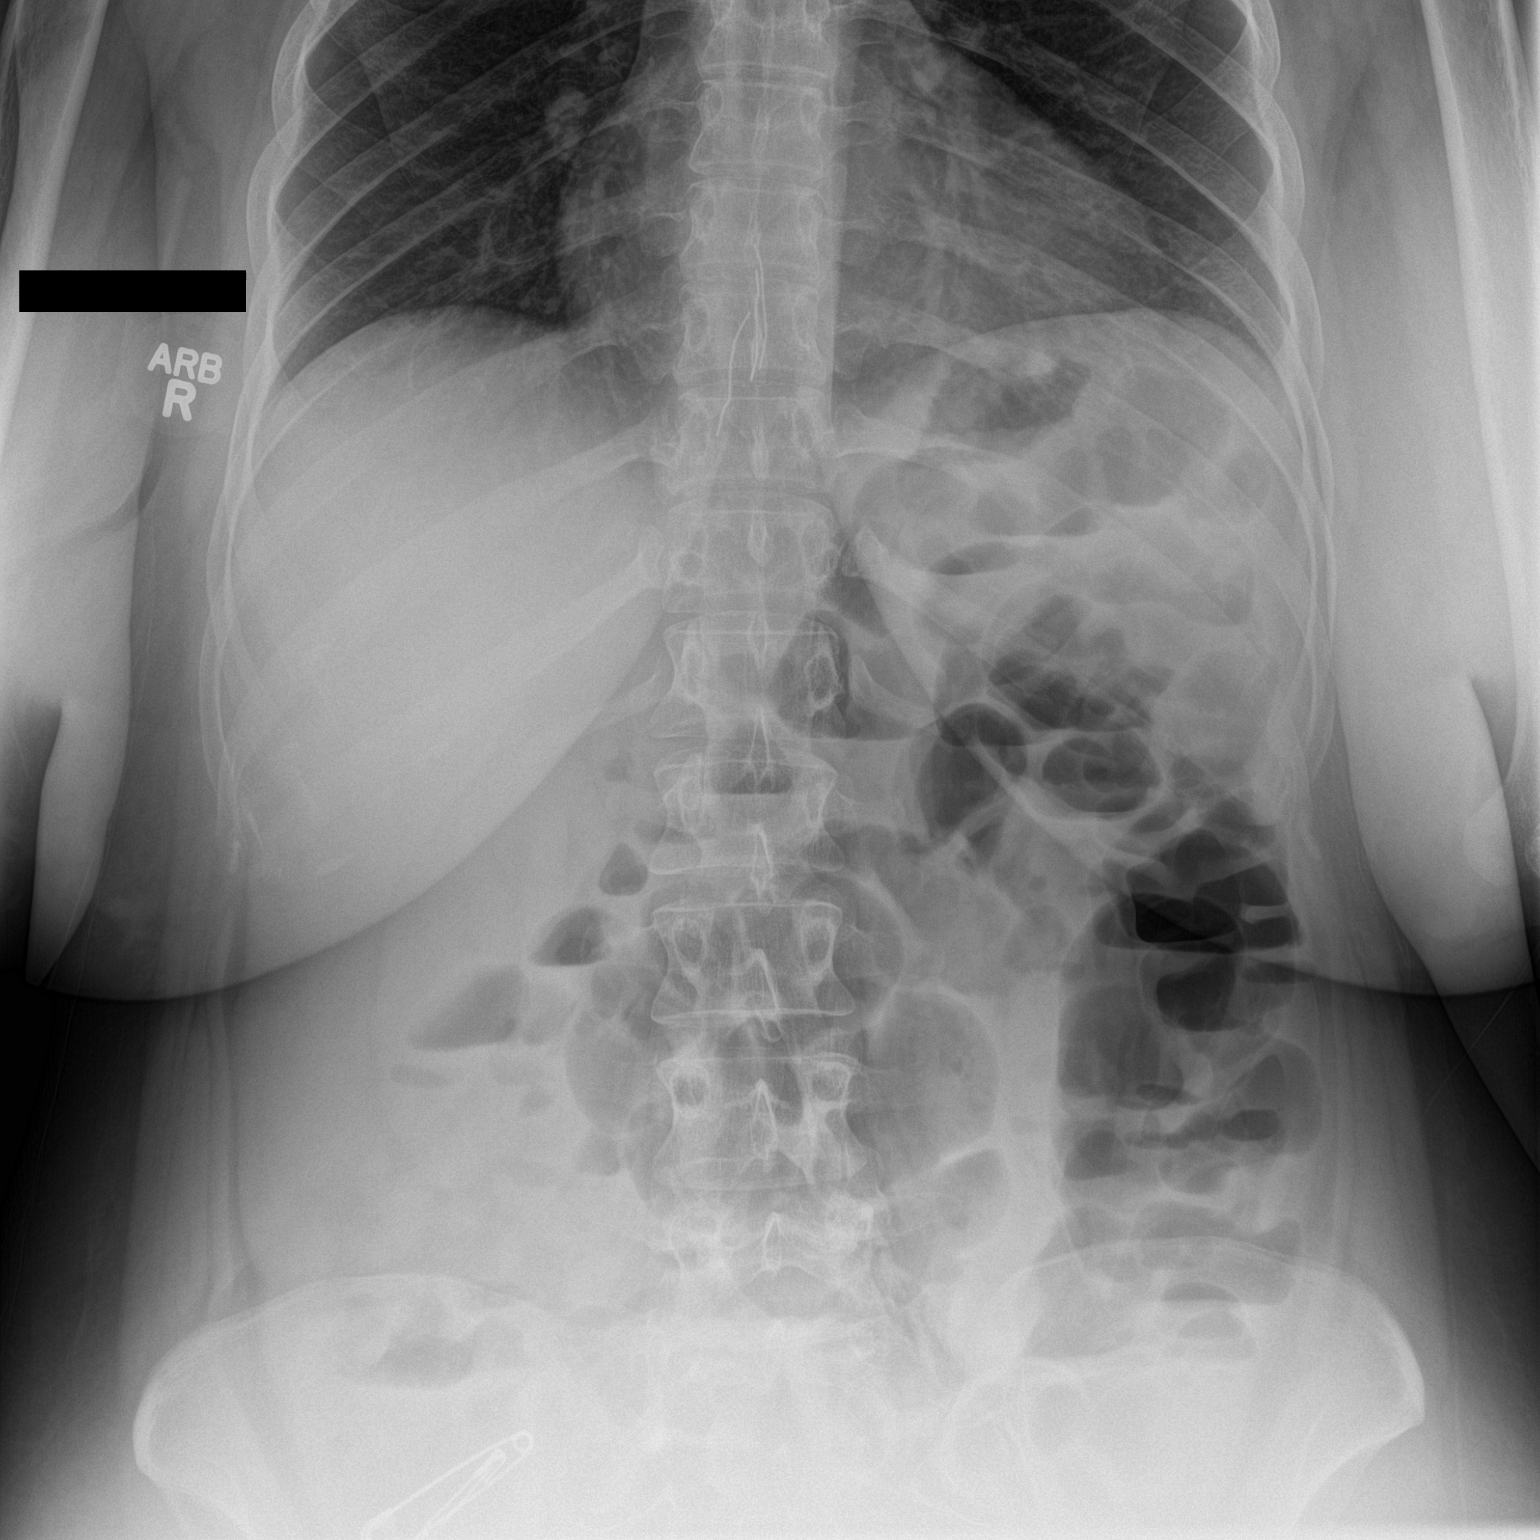
[im 2/2]
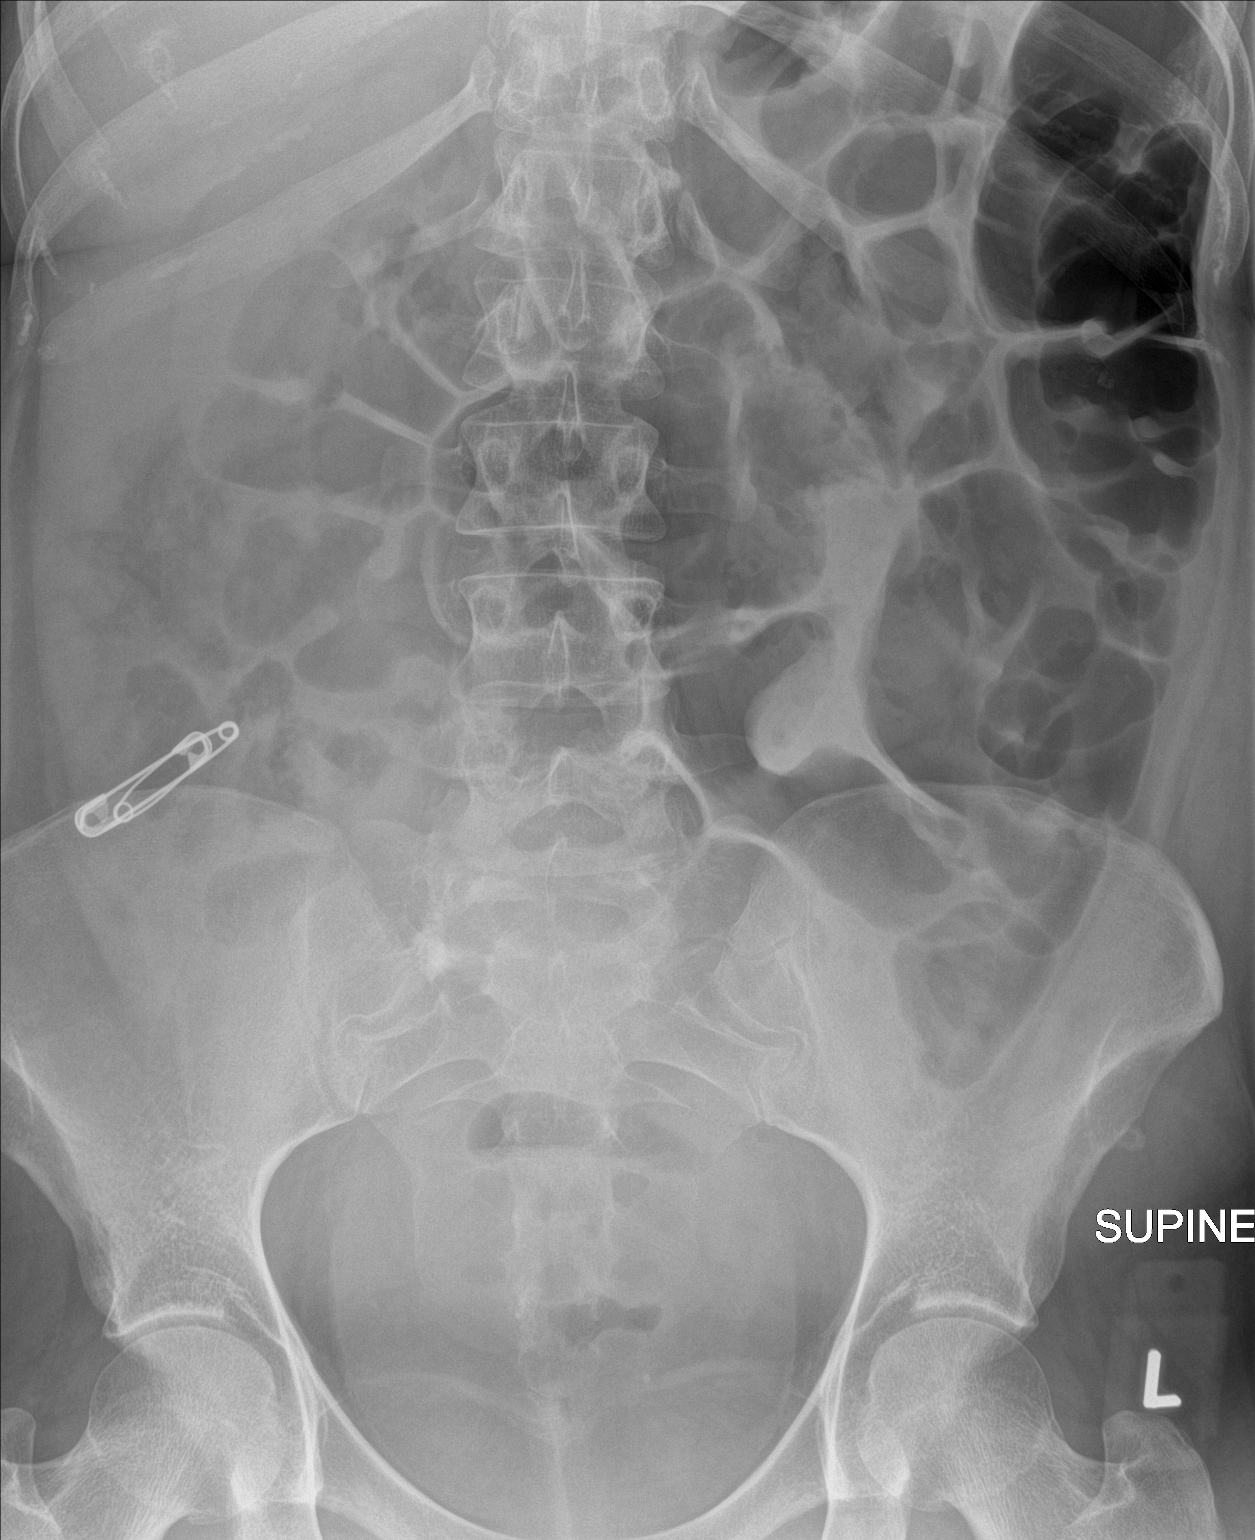

[2 of 2 positions shown; findings below may reference images not displayed]

FINDINGS: The two safety hands are located in the right lower quadrant not
changed significantly in position compared to prior. There no
dilated loops large or small bowel. Several short air-fluid levels
the bowel are improved. There is gas the rectum.

Again noted 3 curvilinear metallic densities projecting over the
epigastrium.
IMPRESSION: 1. Two safety pins are in similar position to comparison radiograph
1 day prior.
2. No evidence of bowel obstruction.
3. Again demonstrated 3 curvilinear foreign bodies projecting over
the epigastric region.

## 2014-08-25 LAB — PLATELET COUNT: PLATELETS: 187 10*3/uL (ref 150–440)

## 2014-09-01 LAB — COMPREHENSIVE METABOLIC PANEL
ALBUMIN: 4 g/dL
AST: 19 U/L
Alkaline Phosphatase: 75 U/L
Anion Gap: 6 — ABNORMAL LOW (ref 7–16)
BUN: 13 mg/dL
Bilirubin,Total: 0.3 mg/dL
CALCIUM: 9.1 mg/dL
CO2: 20 mmol/L — AB
CREATININE: 0.83 mg/dL
Chloride: 109 mmol/L
Glucose: 107 mg/dL — ABNORMAL HIGH
POTASSIUM: 3.1 mmol/L — AB
SGPT (ALT): 13 U/L — ABNORMAL LOW
SODIUM: 135 mmol/L
Total Protein: 7.1 g/dL

## 2014-09-01 LAB — CBC
HCT: 36.9 % (ref 35.0–47.0)
HGB: 12.1 g/dL (ref 12.0–16.0)
MCH: 29.9 pg (ref 26.0–34.0)
MCHC: 32.8 g/dL (ref 32.0–36.0)
MCV: 91 fL (ref 80–100)
PLATELETS: 220 10*3/uL (ref 150–440)
RBC: 4.05 10*6/uL (ref 3.80–5.20)
RDW: 13.3 % (ref 11.5–14.5)
WBC: 12.8 10*3/uL — ABNORMAL HIGH (ref 3.6–11.0)

## 2014-09-01 LAB — DRUG SCREEN, URINE
Amphetamines, Ur Screen: NEGATIVE
BENZODIAZEPINE, UR SCRN: NEGATIVE
Barbiturates, Ur Screen: NEGATIVE
CANNABINOID 50 NG, UR ~~LOC~~: NEGATIVE
Cocaine Metabolite,Ur ~~LOC~~: NEGATIVE
MDMA (ECSTASY) UR SCREEN: NEGATIVE
Methadone, Ur Screen: NEGATIVE
Opiate, Ur Screen: NEGATIVE
Phencyclidine (PCP) Ur S: NEGATIVE
TRICYCLIC, UR SCREEN: NEGATIVE

## 2014-09-01 LAB — SALICYLATE LEVEL: Salicylates, Serum: 24 mg/dL

## 2014-09-01 LAB — URINALYSIS, COMPLETE
BACTERIA: NONE SEEN
Bilirubin,UR: NEGATIVE
Blood: NEGATIVE
GLUCOSE, UR: NEGATIVE mg/dL (ref 0–75)
Ketone: NEGATIVE
LEUKOCYTE ESTERASE: NEGATIVE
NITRITE: NEGATIVE
PROTEIN: NEGATIVE
Ph: 6 (ref 4.5–8.0)
Specific Gravity: 1.005 (ref 1.003–1.030)

## 2014-09-01 LAB — ACETAMINOPHEN LEVEL

## 2014-09-01 LAB — ETHANOL: Ethanol: <5 mg/dL

## 2014-09-02 ENCOUNTER — Inpatient Hospital Stay
Admit: 2014-09-02 | Disposition: A | Discharge status: Home / Self Care | Payer: Self-pay | Attending: Psychiatry | Admitting: Psychiatry

## 2014-09-03 LAB — LIPID PANEL
CHOLESTEROL: 185 mg/dL
HDL Cholesterol: 39 mg/dL — ABNORMAL LOW
LDL CHOLESTEROL, CALC: 126 mg/dL — AB
TRIGLYCERIDES: 98 mg/dL
VLDL Cholesterol, Calc: 20 mg/dL

## 2014-09-03 LAB — LITHIUM LEVEL: LITHIUM: 0.39 mmol/L — AB (ref 0.60–1.20)

## 2014-09-03 LAB — HEMOGLOBIN A1C: HEMOGLOBIN A1C: 5.2 %

## 2014-09-03 LAB — TSH: Thyroid Stimulating Horm: 0.802 u[IU]/mL

## 2014-09-05 NOTE — Discharge Summary (Signed)
PATIENT NAME:  Melanie Bell, STARN MR#:  [id number] DATE OF BIRTH:  [birth year]  DATE OF ADMISSION:  01/25/2013 DATE OF DISCHARGE:  01/29/2013  HOSPITAL COURSE: See dictated history and physical for details of admission. This 24 year old woman with a history of recurrent presentation with mood and psychotic symptoms came into the Emergency Room just shortly after her last discharge reporting that she was having auditory hallucinations and suicidal ideation. She was having some anxiety over conflict with her mother. In the hospital, she was continued on her regular psychiatric medicine. She was given supportive individual and group therapy and included in groups on the unit for education and therapy. The patient did not engage in any dangerous behavior, did not show any suicidality. Very quickly she reported that her hallucinations were gone and her suicidality was improved. Her affect became euthymic, and she was appropriately interactive. The patient was requesting discharge again. She agreed to follow up with her ACT Team in the community. At the time of discharge, she completely denied suicidal ideation and appeared to be at her baseline.   LABORATORY RESULTS: Admission labs showed a TSH that was normal at 2.1. Alcohol undetected.  Chemistry just notable for a chloride slightly elevated at 110, bilirubin low at 0.1. Lithium level on the 11th was 0.52.  Drug screen negative. The CBC showed a white count of 11.8, otherwise unremarkable. Cholesterol 201, HDL low at 33, triglycerides 90.   DISCHARGE MEDICATIONS: Lisinopril 5 mg once a day, prazosin 2 mg at night, Trileptal 150 mg twice a day, venlafaxine extended-release 150 mg once a day, Cogentin 1 mg once a day, lithium 300 mg in the morning and 600 mg at night, fluphenazine 10 mg at night and 2.5 mg 3 times a day, Latuda 80 mg at night, docusate 100 mg twice a day, omeprazole 20 mg once a day, levothyroxine 75 mcg per day.   MENTAL STATUS EXAM AT  DISCHARGE: Young woman, looks her stated age, cooperative with the interview. Eye contact good. Psychomotor activity normal. Speech normal rate, tone and volume. Affect euthymic, reactive and appropriate. Mood is stated as okay. Thoughts appear to be lucid. No sign of loosening of associations or delusions. Denies auditory or visual hallucinations. Denies suicidal or homicidal ideation. Shows improved insight and judgment. Probably low-average intelligence.     DIAGNOSIS, PRINCIPAL AND PRIMARY:  AXIS I: Depression, not otherwise specified.   SECONDARY DIAGNOSES: AXIS II: Borderline personality disorder, rule out chronic developmental disorder.   AXIS III: High blood pressure, dyslipidemia, hypothyroid, constipation, gastric reflux symptoms.   AXIS IV: Severe, chronic stress of illness.   AXIS V: Functioning at time of discharge 55.   ____________________________ [name], MD jtc:cb D: 02/12/2013 16:11:40 ET T: 02/12/2013 16:23:03 ET JOB#: [id number]  cc: [name], MD, <Dictator> [name] MD ELECTRONICALLY SIGNED 02/12/2013 16:55
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
## 2014-09-05 NOTE — Consult Note (Signed)
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   Patient was seen by consultant.   Recommend further assessment or treatment.   Orders entered.   Comments: Will admit to psychiatry.  Electronic Signatures: Orson Slick (MD)  (Signed 29-Aug-14 12:25)  Authored: Brief Consult Note   Last Updated: 29-Aug-14 12:25 by Orson Slick (MD)

## 2014-09-05 NOTE — Consult Note (Signed)
A closed safety pin does not usually cause medical problems once it has reached the lower abdomen.  Recommend discharge if othewise ready for discharge and repeat x-ray in a week.  Would give daily laxative or every other day.  Electronic Signatures: [name] (MD)  (Signed on 02-Sep-14 13:57)  Authored  Last Updated: 02-Sep-14 13:57 by [name] (MD)
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
## 2014-09-06 NOTE — Consult Note (Signed)
Brief Consult Note: Diagnosis: Schizoaffective d/o bipolar type, MR.   Patient was seen by consultant.   Consult note dictated.   Recommend further assessment or treatment.   Orders entered.   Comments: [name] has a h/o schizoaffective disorder, MR and extremely poor coping skills. When in distress, she swallows foreign objects. She swallowed an earing. She is compliant with medications.  PLAN: 1. The patient is on IVC.    2. Please continue all medications as prescribed by her primary psychiatrist.  3. I will follow up. She will likely be discharged to her group home tomorrow.  Electronic Signatures: Orson Slick (MD)  (Signed 01-Jun-15 19:21)  Authored: Brief Consult Note   Last Updated: 01-Jun-15 19:21 by Orson Slick (MD)

## 2014-09-06 NOTE — Op Note (Signed)
PATIENT NAME:  Melanie Bell, Melanie Bell MR#:  [id number] DATE OF BIRTH:  06/19/1990  DATE OF PROCEDURE:  07/07/2013  TEAM SURGEON:  Dr. Chauncey Reading  PREOPERATIVE DIAGNOSIS:  Swallowed  foreign body x 2, failure of patient to pass foreign body.  POSTOPERATIVE DIAGNOSIS: Swallowed foreign body x 2, failure of patient to pass foreign body.   PROCEDURE PERFORMED:   1.  Exploratory laparotomy. 2.  Cecotomy with removal of foreign body and primary closure.  3.  Incidental appendectomy.    ESTIMATED BLOOD LOSS:  2 mL.  SPECIMENS:   1.  Foreign bodies x 2. 2.  Appendix.   ANESTHESIA: General.   ESTIMATED BLOOD LOSS: 25 mL.  DETAILS OF THE SURGERY: [name] is a 24 year old female with history of swallowing foreign objects, who presented after swallowing 2 safety pins. After approximately a week they failed to be passed and fear was that they would not be passed spontaneously and she was brought to the operating room suite for removal of foreign bodies.    DETAILS OF PROCEDURE: As follows, [name] was brought to the operating room suite after informed consent was obtained by her medical decision maker/power of attorney. She was laid supine on the operating room table. She was induced and endotracheal tube was placed. General anesthesia was administered. Her abdomen was then prepped and draped in a standard surgical fashion. A timeout was then performed correctly identifying the patient's name, operative site and procedure to be performed. I then used fluoro to get an idea of where the foreign body was. It appeared to be in her lower abdomen. I then made a small infraumbilical incision that was deepened down to the fascia. The fascia was incised. The peritoneum was entered.  I proceeded to eviscerate her small bowel and look with palpation for both foreign bodies.  I was unable to find the foreign bodies on initial inspection.  I then focused on her cecum and found the 2 objects there in her  cecum. I tried to milk them backwards.  I was hoping for removing them through her appendiceal orifice but was not able to move them forward or reverse.  I then made a small transverse cecotomy and I carefully entered the cecum. The bowel was prepped, but the quality of the prep was relatively poor. The foreign bodies were then removed.  There was no obvious perforation. I then closed the cecotomy in 2 layers with 3-0 Vicryl and then 3-0 silk Lembert. Because of the proximity of the appendix and the fear that this repair would scar in the surrounding tissue and that an appendectomy would be difficult at a later date, I elected to remove her appendix. I placed Kelly clamps across her mesoappendix and then I made a pursestring suture around the base of her appendix with 3-0 silk.  I then tied a 3-0 silk tie around the base of the appendix and ligated it. I then dunked the appendix using the pursestring. I then used 3-0 silk ties to tie the mesoappendix.  After the mesoappendix was cut and the specimen sent to the lab, after I was happy with those repairs, I irrigated the abdomen with 2 liters of normal saline. I then proceeded to close the abdomen with a looped #1 PDS ran from one direction and tied at the other.  I then used staples to close the skin. Sterile dressing was then placed over the wound.  The patient was then awoken, extubated and brought to the postanesthesia care  unit. There were no immediate complications. Needle, sponge and instrument counts were correct at the end of the procedure.      ____________________________ Glena Norfolk. Petronella Shuford, MD cal:dmm D: 07/09/2013 07:49:00 ET T: 07/09/2013 09:33:35 ET JOB#: [id number]  cc: Harrell Gave A. Donatella Walski, MD, <Dictator> Floyde Parkins MD ELECTRONICALLY SIGNED 07/10/2013 8:01

## 2014-09-06 NOTE — Discharge Summary (Signed)
PATIENT NAME:  Melanie Bell, Melanie Bell MR#:  [id number] DATE OF BIRTH:  [birth year]  DATE OF ADMISSION:  07/01/2013 DATE OF DISCHARGE:  07/12/2013  ADMISSION DIAGNOSIS:  1.  Foreign body ingestion - safety pins x 2, 1 open.  2.  History of schizoaffective disorder.  3.  History of prior foreign body ingestions.  4.  Hypercholesterolemia.  5.  Hypertension.  6.  History of suicide attempt.  7.  Gastroesophageal reflux disease.  8.  Diabetes.  9.  History of lumpectomy.   PROCEDURE PERFORMED: Exploratory laparotomy with cecostomy, removal of foreign bodies and incidental appendectomy.   DISCHARGE MEDICATIONS: As follows: Lisinopril 5 mg p.o. daily, Prednisone 2 mg p.o. at bedtime, oxcarbazepine 150 b.i.d., lithium. 300. q.a.m. and 600 q.p.m., fluphenazine 10 at bedtime, fluphenazine 2.5., Colace 100 p.o. daily, Latuda 80 mg p.o. at bedtime, benztropine 1 mg p.o. at bedtime, L-thyroxine 75 mcg by mouth daily, Lorazepam 0.5 t.i.d. p.r.n. anxiety, Effexor 150 mg p.o. daily, Cipro 500 mg p.o. q.12,  Percocet 1 tab p.o. q.4 to 6 hours p.r.n. pain.   INDICATION FOR ADMISSION: [name] is a pleasant 24 year old female with history of foreign body ingestion, who was admitted following suicidal ideation with ingestion of 2 safety pins, 1 of which was open.   HOSPITAL COURSE: As follows: Mr. [name] was admitted for suicide precautions and foreign body ingestion. She had a CT, which showed it in her small bowel initially. I initially made her n.p.o. and attempted to let these pass on their own. I did give her 4 Ls of GoLYTELY to attempt to force foreign bodies through her intestines or possibly to her colon where they could be removed endoscopically. On February 21, I repeated CT and foreign bodies appear to be persistently in her small intestine. In addition, both foreign bodies appeared to be in the same spot. Because of her previous surgery for foreign body ingestion, I was afraid that there were latched  upon some stenosis from a bowel resection from her previous surgery. Therefore, on February 22, I brought her to the operating room and using fluoroscopy I was able to isolate these foreign bodies. They were in her cecum. Because of the scar tissue that would form from her cecostomy, I did perform an appendectomy at that time as well. Throughout hospital course, her diet was slowly advanced to a regular diet and her pain medicine was transitioned from IV to p.o. She did have some wound drainage and site cellulitis and therefore was begun on ciprofloxacin. At the time of discharge, she was taking good p.o. with good p.o. pain control and was voiding and stooling without difficulty.   DISCHARGE INSTRUCTIONS: [name] is to follow up in 1 week. She is to call or return to the ED if she has increased pain, nausea, vomiting, redness or drainage from incision.  ____________________________ Glena Norfolk Jennefer Kopp, MD cal:aw D: 07/22/2013 07:22:56 ET T: 07/22/2013 08:40:28 ET JOB#: [id number]  cc: Harrell Gave A. Leston Schueller, MD, <Dictator> Floyde Parkins MD ELECTRONICALLY SIGNED 07/22/2013 12:29

## 2014-09-06 NOTE — Discharge Summary (Signed)
PATIENT NAME:  Melanie Bell, Melanie Bell MR#:  [id number] DATE OF BIRTH:  Jul 29, 1990  DATE OF ADMISSION:  09/04/2013 DATE OF DISCHARGE:  09/09/2013   HOSPITAL COURSE: See dictated history and physical for details of admission. This 24 year old woman with a history of schizophrenia and a history of self injury in the past, was admitted to the hospital through the Emergency Room. She had presented with complaints that her auditory hallucinations were getting worse and that they were telling her to slit her throat. In the hospital, she was on suicide precautions initially. She did not engage in any dangerous or suicidal behavior. She did not swallow any foreign bodies as far as we know and has denied doing it. She has stated that her suicidal ideation has improved. By the time of discharge, she reports, in fact, that it is entirely gone. She also reports that her auditory hallucinations are no longer bothering her at all. Treatment consisted of continuing her usual outpatient psychiatric medicine in engaging her in individual and group psychotherapy. The patient was able to discuss the fact that she was anxious about her mother's health. She has believed that her mother may have a brain tumor. We did not substantiate whether this is true or not, but this is what she has been worried about it. At that time of discharge, she states that while she is still concerned about it, she has not overly worried. She has better insight into her illness. She is completely compliant with medicine and has no complaints of any side effects. At this point, she appears to be back to her baseline functioning, no longer requiring inpatient treatment. Because of her past history of multiple dangerous ingestions and self injuries, she continues to have some risk for that sort of behavior, but does not have acute dangerousness at this point. She will be discharged back to her usual follow-up in the community on her current medication treatment.    LABORATORY RESULTS: Admission labs included a lithium level of 0.67, pregnancy negative, salicylates normal, acetaminophen negative and alcohol negative. Chemistry panel: Slightly elevated chloride 112, slightly low creatinine 0.53, nothing significant. CBC normal. Urinalysis unremarkable. Drug screen negative.   MENTAL STATUS EXAM AT DISCHARGE: Neatly dressed and groomed woman, looks her stated age, cooperative with the interview. Good eye contact, normal psychomotor activity. Speech is normal in rate, tone and volume. Affect is slightly blunted. Mood stated as fine. Thoughts appear to be lucid. No obvious delusions or bizarre thinking. Denies hallucinations. Denies suicidal or homicidal ideation. Shows adequate insight and judgment. Normal intelligence. Intact short and long-term memory.   DISCHARGE MEDICATIONS: Lisinopril 5 mg once a day, prazosin 2 mg at night, oxcarbazepine 150 mg twice a day, venlafaxine extended release 150 mg per day, Cogentin 1 mg at night, lithium 300 mg in the morning and 600 mg at night, fluphenazine 10 mg at night and 2.5 mg 3 times a day, lurasidone 80 mg in the evening with a meal, lorazepam 0.5 mg every 8 hours as needed for anxiety, docusate 100 mg twice a day and levothyroxine 75 mcg once a day.   DISPOSITION: Discharge back to her group home. Follow up with CBC.  DIAGNOSES, PRINCIPAL AND PRIMARY:  AXIS I: Schizophrenia. SECONDARY DIAGNOSES: AXIS I: No further. AXIS II: No diagnosis. AXIS III: High blood pressure, constipation and hypothyroidism.  AXIS IV: Moderate to severe from chronic illness and being separated from her family.  AXIS V: Functioning at time of discharge 81.   ____________________________ [name].  Bria Portales, MD jtc:aw D: 09/09/2013 12:11:31 ET T: 09/09/2013 12:32:20 ET JOB#: [id number]  cc: [name], MD, <Dictator> [name] MD ELECTRONICALLY SIGNED 09/09/2013 15:22

## 2014-09-06 NOTE — Consult Note (Signed)
PATIENT NAME:  Melanie Bell, Melanie Bell MR#:  [id number] DATE OF BIRTH:  [birth year]  DATE OF CONSULTATION:  07/01/2013  CONSULTING PHYSICIAN:  Harrell Gave A. Ascencion Stegner, MD  REASON FOR CONSULTATION: Foreign body ingestion x 2.  HISTORY OF PRESENT ILLNESS:  Melanie Bell is a pleasant 24 year old female with a history of schizoaffective disorder who has according to her been previously operated on by Dr. Rochel Brome for a swallowed shower hook, who presents after swallowing 2 safety pins due to  auditory hallucinations and suicidal ideations. She has no current abdominal pain, has been doing fine up until now. No fevers or chills. No diarrhea, constipation, nausea, vomiting. No headache, shortness of breath, chest pain, throat pain, dysuria, hematuria.   PAST MEDICAL HISTORY: 1.  Schizoaffective disorder.  2.  History of foreign body ingestions on multiple occasions.  3.  Asthma.  4.  Hypercholesterolemia.  5.  Hypertension.  6.  History of suicide attempt.  7.  History of gastroesophageal reflux disease.  8.  History of diabetes.  9.  History of lumpectomy.   HOME MEDICATIONS: 1.  Effexor.  2.  Prazosin.   3.  Oxcarbazepine. 4.  Ativan.  5.  Lithium. 6.  Lisinopril.  7.  L-thyroxine.  8.  Latuda. 9.  Fluphenazine.  10.  Colace.  11.  Benztropine.   ALLERGIES: BETADINE, IODINE, SHELLFISH, AND SEAFOOD.   SOCIAL HISTORY: Lives in a group home. Smokes 1/2 pack a day. No alcohol or drug use.   FAMILY HISTORY: Positive for hypertension, high blood pressure, cancer.   PHYSICAL EXAMINATION: VITAL SIGNS: Temperature 98.6, pulse 95, blood pressure 124/62, respirations 18.  GENERAL: No acute distress. Alert and oriented x 3.  HEAD: Normocephalic, atraumatic.  EYES: No scleral icterus. No conjunctivitis.  FACE: No obvious facial trauma. Normal external nose. Normal external ears.  CHEST: Lungs clear to auscultation. Moving air well HEART: Regular rate and rhythm. No murmurs, rubs or  gallops. ABDOMEN: Soft, nontender, nondistended. Has midline abdominal scar. EXTREMITIES: Moves all extremities well. Strength 5 out of 5. NEUROLOGIC: Cranial nerves II through XII grossly intact. Sensation intact all 4 extremities.  LABORATORY DATA: White cell count of 9.7. Otherwise, labs unremarkable.   X-RAY: Shows 2 foreign bodies, safety pins, 1 open and 1 closed, apparently in jejunum.  ASSESSMENT AND PLAN: Melanie Bell is a pleasant 24 year old with schizoaffective disorder who swallowed 2 safety pins, 1 of which was open. Will obtain baseline CT to evaluate positioning, particularly of open pin. No need for emergent surgery. We will hope that these will pass, unless there is indication for perforation. Will continue to follow.   ____________________________ Glena Norfolk Anyelina Claycomb, MD cal:jcm D: 07/01/2013 17:20:23 ET T: 07/01/2013 18:54:03 ET JOB#: [id number]  cc: Harrell Gave A. Tavi Gaughran, MD, <Dictator> Floyde Parkins MD ELECTRONICALLY SIGNED 07/04/2013 9:06

## 2014-09-06 NOTE — Consult Note (Signed)
PATIENT NAME:  Melanie Bell, WILBOURN MR#:  [id number] DATE OF BIRTH:  1991-04-23  DATE OF CONSULTATION:  10/14/2013  REFERRING PHYSICIAN:  Dr. Lennette Bihari A. Paduchowski CONSULTING PHYSICIAN:  Jeorgia Helming B. Cassara Nida, MD  IDENTIFYING DATA: This patient is a 24 year old female with a history of mood instability and a history of ingestion of multiple objects.   CHIEF COMPLAINT: "I am feeling a little better now."   HISTORY OF PRESENT ILLNESS: This patient is a resident of a group home. She has been there for 5 years. When frustrated, she has a tendency to swallow sharp objects and she was admitted to [facility] multiple times for that. On the day of admission, she had an argument with another resident and went to her room where she could not find anything else to swallow except for her earring.  After she swallowed the earring, she let the staff know and they brought her to the Emergency Room. The earring is the size of a dime. It is not a particularly sharp object. Reportedly, initially, the patient did make some suicide-like statements, saying that she is so frustrated that she may as well puncture her insides.  To me, she denied the suicidal ideation or intent. In the past, she swallowed much more dangerous objects, including safety pins that were opened and razor blades. She denies any psychotic symptoms. Denies symptoms suggestive of bipolar mania. She denies alcohol or illicit substance use.   PAST PSYCHIATRIC HISTORY: There were multiple hospitalizations after self-injurious behavior. She typically presents with foreign bodies. This has been going on since she was a child. She has a diagnosis of schizoaffective disorder. She did have surgeries to remove foreign objects in the past. She has been on multiple medications, including mood stabilizers and antipsychotics. Unfortunately, she did not tolerate Clozaril. She is currently stable on her medicines and does not wish any medication  changes.  FAMILY PSYCHIATRIC HISTORY:  None reported.   SOCIAL HISTORY: She lives in a group home. Her mother is the guardian. She divorced and relocated to [place] recently. It is quite possible that she has health issues herself. The patient has not been able to get in touch with her, and reportedly, her cell phone is not working. I did not have the opportunity to contact her mother as usual. She is allowed to return to her group home under the condition that she spends a few days in the hospital. I do not agree that we should reward bad behavior with a hospitalization.  That really creates a bad pattern.  We will probably keep the patient in the Emergency Room till tomorrow and discharge her back to her group home.    PAST MEDICAL HISTORY: Obesity, hypothyroidism, hypertension, history of foreign body removals.   ALLERGIES: BETADINE, IODINE, SEAFOOD, SHELLFISH.  MEDICATIONS ON ADMISSION:  Lisinopril 5 mg daily, Minipress 2 mg at bedtime, lorazepam 0.5 mg every 8 hours as needed for anxiety, oxcarbazepine 150 mg twice daily, venlafaxine 150 mg daily, fluphenazine 2.5 mg 3 times daily and 10 mg at bedtime, lithium 300 mg twice daily, Latuda 80 milligrams with supper, Colace 100 mg twice daily, Synthroid 0.075 mg daily.   SOCIAL HISTORY:  As above, she is a resident of a group home. She lives away from her mother now.  Her placement in this particular group home was dictated by the fact that her step-grandmother lives in the area. However, since the mother divorced her stepfather, she does not have good support from her grandmother  either.   REVIEW OF SYSTEMS:  CONSTITUTIONAL: No fevers or chills. No weight changes.  EYES: No double or blurred vision.  EARS, NOSE, AND THROAT:  No hearing loss.  RESPIRATORY: No shortness of breath or cough.  CARDIOVASCULAR: No chest pain or orthopnea.  GASTROINTESTINAL: No abdominal pain, nausea, vomiting, or diarrhea.  GENITOURINARY: No incontinence or  frequency.  ENDOCRINE: No heat or cold intolerance.  LYMPHATIC: No anemia or easy bruising.  INTEGUMENTARY: No acne or rash.  MUSCULOSKELETAL: No muscle or joint pain.  NEUROLOGIC: No tingling or weakness.  PSYCHIATRIC: See history of present illness for details.   PHYSICAL EXAMINATION:  VITAL SIGNS: Blood pressure 114/56, pulse 61, respirations 18, temperature 98.3.  GENERAL: This is an obese young female in no acute distress. The rest of the physical examination is deferred to her primary attending.   LABORATORY DATA: Chemistries within normal limits with potassium 3.4. LFTs within normal limits. Urine toxicology screen negative for substances. CBC within normal limits. Urinalysis is not suggestive of a urinary tract infection. Serum acetaminophen and salicylates are low. Urine pregnancy test is negative.   MENTAL STATUS EXAMINATION: The patient is alert and oriented to person, place, time, and situation. She is pleasant, polite, and cooperative. She is adequately groomed, wearing hospital scrubs and a yellow shirt. She maintains good eye contact. Her speech is soft. Mood is sad with flat affect. Thought process is logical. She denies thoughts of hurting herself or others. There are no delusions or paranoia. There are no auditory or visual hallucinations. Her cognition is grossly intact. Her registration, recall, short and long-term memory seem intact. She is of average to below average intelligence and fund of knowledge. Her insight and judgment are limited.   DIAGNOSES:  AXIS I: Schizoaffective disorder, bipolar type.  AXIS II: Deferred. AXIS III: Hypothyroidism, hypertension, dyslipidemia, obesity.  AXIS IV: Mental illness, poor insight, primary support.  AXIS V: Global assessment of functioning 45.   PLAN:  1. The patient does meet criteria for inpatient admission.  2. She is to continue all medications as in the community.  3. Her group home will pick her up  tomorrow.   ____________________________ Wardell Honour Mirra Basilio, MD jbp:dd D: 10/14/2013 18:09:25 ET T: 10/14/2013 19:50:20 ET JOB#: [id number]  cc: Devaeh Amadi B. Bary Leriche, MD, <Dictator> [name] MD ELECTRONICALLY SIGNED 10/15/2013 5:30

## 2014-09-06 NOTE — H&P (Signed)
PATIENT NAME:  Melanie Bell, REEN MR#:  [id number] DATE OF BIRTH:  Feb 16, 1991  DATE OF ADMISSION:  09/04/2013  IDENTIFYING INFORMATION AND CHIEF COMPLAINT: A 24 year old woman brought to the Emergency Room from her group home. Chief complaint: "I'm hearing voices to slit my throat."   HISTORY OF PRESENT ILLNESS: Information obtained from the patient and the chart. She says the day before yesterday she started having auditory hallucinations that were telling her that she should slit her throat with a razor. She did not act on it. She also states that she has not swallowed any foreign bodies or any other way tried to harm herself in the last couple of days. She told staff at her group home, who thought it best of course to bring her to the hospital before she did hurt herself. The patient cannot think to me of any reason why her symptoms might have gotten worse. She denies that there has been any change in the situation with her family. Denies that any medications have been changed. Denies that she has been having any conflict or difficulties at her group home. Denies substance abuse. She is not necessarily the most reliable historian, but she cannot think of any reason why her mood might be worse. Overall she says that her mood really is not that much different, and she does not actually want to hurt herself. She says that she does not sleep well at night, but that is chronic. She often sleeps during the day. She does not have any new physical symptoms. Denies homicidal ideation. Says that she has been compliant with all of her medicine.   PAST PSYCHIATRIC HISTORY: The patient has had mental health problems since she was a child when she was engaging in self-injuring behavior at a young age. She has a diagnosis of schizoaffective disorder, but has never that I can see been really manic. She typically presents having made some self-injury. Classically it has been swallowing foreign bodies. In the past, she has  at various times swallowed razor blades, straight pins, safety pins (open), and fish hooks and shower curtain rings. She has required surgery on a couple of occasions to have these extracted. No history that I can see of being violent to others. She has been on a wide variety of medications, including multiple mood stabilizers and antipsychotics. She has been on Clozaril in the past, but it seems that she developed a low white blood cell count. It is not obvious from what I can see that the Clozaril was any more effective than antipsychotics since then have been.   SOCIAL HISTORY: The patient resides in a group home. She says she gets along well and has no complaints there. Her parents are living. Her mother is a travel Marine scientist and is frequently out of town. She does not have as much contact with her parents as she would usually like.   PAST MEDICAL HISTORY: Addition to the history of surgeries for the foreign body swallowing, she also has hypothyroidism, high blood pressure, and is overweight.   SUBSTANCE ABUSE HISTORY: Not drinking or using drugs. No history of substance abuse.   FAMILY HISTORY: She does not know of any family history of mental illness.   REVIEW OF SYSTEMS: The patient says she is having auditory hallucinations. Denies visual hallucinations. Denies suicidal desire or homicidal desire. Denies being depressed. Does not report any other physical symptoms. No pain. No malaise. The rest of the full review of systems is negative.   MENTAL  STATUS EXAMINATION: Casually dressed, well-groomed woman, looks her stated age, interviewed in the Emergency Room. She was sleepy and took a few minutes to wake up, but once she did, she was at least passively cooperative with the interview. Made good eye contact. Psychomotor activity sluggish. Speech is slow and decreased in amount. Affect flat. Mood stated as being okay. Thoughts are slow halting. Did not make any obviously delusional statements. Endorses  auditory hallucinations telling her to cut her throat. Denies visual hallucinations. Alert and oriented x 4. Could remember 3 out of 3 objects immediately and at 3 minutes. Longer term memory was intact as well.   PHYSICAL EXAMINATION: SKIN: No skin lesions actively identified.  HEENT: Pupils equal and reactive. Face symmetric. Oral mucosa dry.  NEUROMUSCULAR: Full range of motion at all extremities. Normal gait. Strength and reflexes normal and symmetric throughout. Cranial nerves symmetric and normal.  LUNGS: Clear without wheezes.  HEART: Regular rate and rhythm.  ABDOMEN: Soft, nontender, normal bowel sounds.  VITAL SIGNS: Show a blood pressure currently of 119/75, respirations 20, pulse 85, temperature 98.3.   LABORATORY RESULTS: Chemistry panel: Slightly elevated chloride, no other significant abnormalities. Drug screen negative. Alcohol negative. Lithium level 0.67. Hematology panel all normal. Urinalysis normal. Pregnancy test negative.   ASSESSMENT: A 24 year old woman with history of schizoaffective disorder versus schizophrenia. Also in the past, it has been noted that she has some degree of manipulativeness about her behavior. She comes into the hospital reporting auditory hallucinations commanding her to hurt herself. Fortunately, she does not appear to have actually done anything to hurt herself this time. Needs hospitalization for safety.   TREATMENT PLAN: Admit to psychiatry ward. Continue current medications as prescribed. Close suicide precautions in place. Try to engage her in groups and activities on the unit. See if we can get any collateral history from the people at the group home about what might have set this off. I would not assume that we need to change her medicines. We will see how she does once she comes into the hospital.   DIAGNOSIS, PRINCIPAL AND PRIMARY:  AXIS I: Schizoaffective disorder, depressed type.   SECONDARY DIAGNOSES: AXIS I: No further diagnosis.   AXIS II: Deferred.  AXIS III: Hypothyroidism, status post abdominal surgery, overweight, high blood pressure.  AXIS IV: Moderate to severe, chronic, from isolation and illness.  AXIS V: Functioning at time of admission 30.   ____________________________ [name], MD jtc:jcm D: 09/04/2013 14:55:55 ET T: 09/04/2013 16:25:52 ET JOB#: [id number]  cc: [name], MD, <Dictator> [name] MD ELECTRONICALLY SIGNED 09/04/2013 18:33

## 2014-09-06 NOTE — Consult Note (Signed)
Brief Consult Note: Diagnosis: Schizoaffective d/o bipolar type, MR.   Patient was seen by consultant.   Consult note dictated.   Recommend further assessment or treatment.   Orders entered.   Comments: [name] has a h/o schizoaffective disorder, MR and extremely poor coping skills. When in distress, she swallows foreign objects. She swallowed 2 safety pins this time, one of them open. She is monitored for  their passage. She is compliant with medications.  MSE: She is alert and fully oriented. She denies suicidal or homicidal ideation. She is not psychotic. Her cognition is grossly intact.  PLAN: 1. The patient does not meet criteria for psychiatric admission.   2. Please continue all medications as prescribed by her primary psychiatrist.  3. I will sign off. Please contact psychiatrist on call ife necessary.  Electronic Signatures: Melanie Bell (MD)  (Signed 20-Feb-15 17:06)  Authored: Brief Consult Note   Last Updated: 20-Feb-15 17:06 by Melanie Bell (MD)

## 2014-09-07 NOTE — Consult Note (Signed)
Brief Consult Note: Diagnosis: GI consultation for FB ingestion.   Recommend further assessment or treatment.   Comments: Discussed with Dr. Verdie Shire and will proceed with CT scan of abdomen and pelvis without contrast to verify placement of foreign body, razor blade prior to proceeding with endocscopy evaluation.  Electronic Signatures: Payton Emerald (NP)  (Signed 24-Jun-13 15:10)  Authored: Brief Consult Note   Last Updated: 24-Jun-13 15:10 by Payton Emerald (NP)

## 2014-09-07 NOTE — Consult Note (Signed)
See Melanie Bell's notes. Persistent foreign body in left abdomen. Unusual for razor blade not to pass through colon. Therefore, CT ordered to make sure it is not in the small intestine. Could not schedule colon today due to patient eating all day. If CT shows razor in the colon, then will prep patient more and schedule colon later. Thanks.  Electronic Signatures: Verdie Shire (MD)  (Signed on 24-Jun-13 16:14)  Authored  Last Updated: 24-Jun-13 16:14 by Verdie Shire (MD)

## 2014-09-07 NOTE — Consult Note (Signed)
Brief Consult Note: Diagnosis: swallowed razor blade unit(s?).   Patient was seen by consultant.   Recommend further assessment or treatment.   Discussed with Attending MD.   Comments: H&P done in [facility] as consult note. Needs psychiatric admission to locked unit. Ordered Golytely bowel prep for constipation and will follow serial XRays to see if FB progresses. Since razors are held by plastic unit, I doubt there is much risk of bowel obstruction or perforation, but either of those is possible.  Electronic Signatures: Consuela Mimes (MD)  (Signed 16-Jun-13 18:46)  Authored: Brief Consult Note   Last Updated: 16-Jun-13 18:46 by Consuela Mimes (MD)
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
## 2014-09-07 NOTE — H&P (Signed)
History of Present Illness 21 yowf who swallowed a triple-bladed razor blade head unit last week and swallowed another one today. Only complaint is of neck pain. Also c/o chronic constipation and hasn't had a BM in a week. No nausea, vomiting. Still passing flatus.    Past History s/p mini-laparotomy and removal of foriegn body from small intestine 4 years ago (shower curtain hook)   Past Med/Surgical Hx:  Schizoaffective DO:   asthma:   elevated cholesterol:   HTN:   Suicide Attempt:   gerd:   Diabetes:   foreign body removed from abd:   Lumpectomy:   ALLERGIES:  Betadine: Anaphylaxis  Iodine: Anaphylaxis  Seafood: Anaphylaxis  Shellfish: Anaphylaxis  HOME MEDICATIONS: Medication Instructions Status  acetaminophen 325 mg oral tablet 2  orally  Q4 hours prn   Active  Multiple Vitamins oral capsule 1  orally once a day  Active  fluphenazine 2.5 mg oral tablet 1  orally 3 times a day  Active  fluphenazine 5 mg oral tablet 2  orally once daily at 9pm Active  lithium 300 mg oral tablet 1  orally  every morning Active  lithium carbonate 600 mg    once a day (at bedtime)  Active  lisinopril 5 mg oral tablet 1  orally once a day  Active  Prilosec 20 mg oral delayed release capsule 1  orally  every 6 am Active  Effexor XR 225mg  orally   once a day  Active  Colace 100 mg oral capsule 1  orally 2 times a day  Active  Latuda 80 mg oral tablet 1  orally  every 5pm Active  Pravachol 40 mg oral tablet 1  orally once (at bedtime)  Active  MiraLax oral powder for reconstitution 17 gm  Once Daily PRN   Active  Ativan 0.5 mg oral tablet 1  orally  Q8 hours prn   Active   Family and Social History:   Family History Non-Contributory    Social History positive tobacco (Greater than 1 year), negative ETOH, negative Illicit drugs, 1/3 ppd    Place of Living group home, unemployed   Review of Systems:   Fever/Chills No    Cough No    Sputum No    Abdominal Pain Yes    Diarrhea No     Constipation Yes    Nausea/Vomiting No    SOB/DOE No    Chest Pain No    Dysuria No    Tolerating PT Yes    Tolerating Diet Yes    Medications/Allergies Reviewed Medications/Allergies reviewed   Physical Exam:   GEN well developed, well nourished, obese    HEENT pink conjunctivae, PERRL, hearing intact to voice, moist oral mucosa    NECK No masses  trachea midline    RESP normal resp effort  clear BS  no use of accessory muscles    CARD regular rate  no murmur  no thrills  no JVD  no Rub    ABD denies tenderness  soft  normal BS  no Adominal Mass    LYMPH negative neck    SKIN normal to palpation, skin turgor good    NEURO cranial nerves intact, follows commands, strength:, motor/sensory function intact    PSYCH alert, A+O to time, place, person   Radiology Results: XRay:    16-Jun-13 18:01, Chest PA and Lateral   Chest PA and Lateral   REASON FOR EXAM:    swallowed  foreigh body  COMMENTS:  PROCEDURE: DXR - DXR CHEST PA (OR AP) AND LATERAL  - Oct 30 2011  6:01PM     RESULT: The lungs are well-expanded and clear. The mediastinum is normal   in width. The cardiac silhouette is normal in size. There is no pleural   effusion. On the lateral film there is a radiodense metallic structure   projecting over the upper abdomen. This is very faintly visible on the   frontal film just above the inferior margin.    IMPRESSION:   1. I do not see evidence of acute cardiopulmonary abnormality.  2. There is a metallic density foreign body that projects over the   epigastrium to the left of midline.   Dictation Site: 5          Verified By: DAVID A. Martinique, M.D., MD    16-Jun-13 18:05, Soft Tissue Neck   Soft Tissue Neck   REASON FOR EXAM:    swallowed foreign body  COMMENTS:       PROCEDURE: DXR - DXR SOFT TISSUE NECK  - Oct 30 2011  6:05PM     RESULT: AP and lateral views of the cervical spine and soft tissues of   the neck reveal no foreign bodies.  The visualized portions of the   cervical spine appear normal. The cervical airway is normal in appearance   as well.    IMPRESSION:  No foreign body is demonstrated in the cervical airway or   proximal esophagus.     Dictation Site: 5      Verified By: DAVID A. Martinique, M.D., MD    16-Jun-13 18:08, Abdomen Flat and Erect   Abdomen Flat and Erect   REASON FOR EXAM:    swallowed  foreigh body 8 days ago  COMMENTS:       PROCEDURE: DXR - DXR ABDOMEN 2 V FLAT AND ERECT  - Oct 30 2011  6:08PM     RESULT: Supine and upright abdominal films reveal a metallic foreign body   measuring 4.7 centimeters in length by 0.5 cm transversely. This lies in   the left aspect of the upper abdomen. There is no evidence of bowel   obstruction or extraluminal gas. There is likely constipation. The bony   structures exhibit no acute abnormality.    IMPRESSION:  There is a metallic foreign body that projects in the left   upper abdomen. I do not see evidence of bowel obstruction. There is   likely constipation present.      Verified By: DAVID A. Martinique, M.D., MD     Assessment/Admission Diagnosis GI tract foreign body (stomach?) Constipation Repeat suicide attempt Schizoaffective d/o    Plan Golytely  Will follow with serial films   Electronic Signatures: Consuela Mimes (MD)  (Signed 16-Jun-13 18:43)  Authored: CHIEF COMPLAINT and HISTORY, PAST MEDICAL/SURGIAL HISTORY, ALLERGIES, HOME MEDICATIONS, FAMILY AND SOCIAL HISTORY, REVIEW OF SYSTEMS, PHYSICAL EXAM, Radiology, ASSESSMENT AND PLAN   Last Updated: 16-Jun-13 18:43 by Consuela Mimes (MD)

## 2014-09-07 NOTE — Consult Note (Signed)
PATIENT NAME:  Melanie Bell, Melanie Bell MR#:  [id number] DATE OF BIRTH:  [birth year]  DATE OF CONSULTATION:  11/07/2011  REFERRING PHYSICIAN:  Orson Slick, MD CONSULTING PHYSICIAN:  Verdie Shire, MD / Payton Emerald, NP  PRIMARY CARE PHYSICIAN: Casilda Carls, MD  REASON FOR CONSULTATION: Foreign body in GI tract.   HISTORY OF PRESENT ILLNESS: Melanie Bell is a 24 year old Caucasian female who has a significant history of depression. Prior to her admission on 11/01/2011, she had a two week history of depression having been worse. The patient has a significant medical history of depression as well as schizophrenia. The patient was upset that her mother had been out of town who is a traveling Mining engineer. The patient states she has not been able to talk to her as  frequently as she would like.  Thus two Saturdays ago she swallowed one razor blade which she states passed on Sunday, 11-18-11. She swallowed a second razor blade which has since not passed and has continued to be evident on radiological evaluation. Normally her bowel pattern habit is once a week. She states she takes FiberCon 1 tablet a day. She has complaint of abdominal pain bilaterally to lower quadrants of the abdomen as well as suprapubically. She has had no evidence of rectal bleeding. She was given Go-Light of Thursday of last week and had a good bowel movement, but unfortunately no passage of foreign body, passage of flatulence since, no nausea, no vomiting. The patient has been on a regular diet as well as drinking liquids without any difficulty. This is not unfortunately the first occurrence in which the patient has swallowed foreign bodies. Two years ago she underwent surgical intervention after having swallowed a shower curtain hook.   ALLERGIES: Betadine, iodine, seafood, shellfish.  HOME MEDICATIONS: Reviewed by myself.   PAST MEDICAL HISTORY:  1. Depression.  2. Schizophrenia.  3. Hypertension.     PAST SURGICAL  HISTORY:  1. Right lumpectomy. 2. Intestinal surgery in approximately 2011 for foreign body removal. 3. Right leg foreign body removal after having fallen on a piece of wire, 2012.   FAMILY HISTORY: Great-aunt with history of breast cancer. Mother with history of diabetes.   SOCIAL HISTORY: Smokes a pack of cigarettes a day. No alcohol.   REVIEW OF SYSTEMS: CONSTITUTIONAL: Denies any unexplained weight loss, weight gain. No fevers or chills. Denies excessive fatigue. HEENT: No blurred vision or double vision, but does wear glasses. No epistaxis. PULMONARY: Denies cough, wheezing, or shortness of breath. CARDIOVASCULAR: No chest pain or heart palpitations. GASTROINTESTINAL: See history of present illness. GENITOURINARY: Denies any hematuria or dysuria. MUSCULOSKELETAL: Denies arthralgias or myalgias. NEUROLOGIC: No history of transient ischemic attack, seizure disorder, or stroke. Other than what is stated, all 10 systems are reviewed and checked, otherwise unremarkable.   PHYSICAL EXAMINATION:   VITAL SIGNS: Temperature 97.7 with a pulse of 86 and respirations 20.   GENERAL: Well developed, overweight 24 year old Caucasian female, in no acute distress noted. Pleasant flat affect. Depressive mood.   HEENT: Normocephalic, atraumatic. Pupils equal and reactive to light. Conjunctivae clear. Sclerae anicteric.   NECK: Supple. Trachea midline.   PULMONARY: Symmetric rise and fall of chest. Clear to auscultation throughout.   CARDIOVASCULAR: Regular rhythm, S1 and S2. No murmurs or gallops.   ABDOMEN: Soft and nondistended. Bowel sounds present, slightly hypoactive. Marked discomfort noted suprapubically bilateral lower quadrants.   RECTAL: Deferred.   MUSCULOSKELETAL: Movement of all four extremities. No contractures. No clubbing.   EXTREMITIES: No edema.  SKIN: Color pink, warm and dry.   PSYCH: Alert and oriented x4. Memory grossly intact.   LABORATORY, DIAGNOSTIC AND RADIOLOGIC  DATA: From 10/30/2011 - urinalysis unremarkable.  Acetaminophen level less than 2.  Salicylate level elevated at 3.2.  Chest x-ray, PA and lateral, 10/30/2011, on admission, showed there is a metallic density foreign body that projects over the epigastrium to the left midline.   Soft tissue of neck revealed no foreign body.  On 10/30/2011, flat and upright of abdomen revealed metallic foreign body that projects to the left upper abdomen. On 10/31/2011, flat and upright for continued monitoring in comparison. The previously noted foreign body is projected over the right upper quadrant, finding is likely in the duodenum, but the location cannot be determined with certainty since the colon also overlies this area. On 11/01/2011, flat and upright of abdomen previously noted ingested foreign body is projected over the right abdomen. Appears to be in the small bowel or the colon. On 11/02/2011, no subdiaphragmatic free air is seen. The previously noted foreign body, in the GI tract, is now located in the left upper quadrant.  On 11/04/2011, previously noted ingested foreign body is now projected over the left abdomen at the level of the mid descending colon. No subdiaphragmatic free air or other acute changes. On 11/05/2011, similar findings. On today's date, 11/07/2011, KUB reveals metallic density is noted in the left mid abdomen similar in position since 11/06/2011.   IMPRESSION:  1. Foreign body ingestion.  2. Known history of depression as well as schizophrenia.   PLAN: The patient's presentation was discussed with Dr. Verdie Shire. Recommendation is to proceed with a CT scan without contrast this afternoon to allow further evaluation of metallic density, i.e. razor blade, to assess if truly present in the colon versus in the small bowel. Once findings have been reviewed, decision will be made if surgical intervention is warranted or whether endoscopic evaluation via colonoscopy is able to retrieve the  foreign body. The patient has been placed on a clear liquid diet at this time.   These services provided by Payton Emerald, MS, APRN, [facility], FNP under collaborative agreement with Dr. Verdie Shire.  ____________________________ Payton Emerald, NP dsh:slb D: 11/07/2011 14:45:53 ET T: 11/07/2011 16:08:04 ET JOB#: [id number]  cc: Payton Emerald, NP, <Dictator> Payton Emerald MD ELECTRONICALLY SIGNED 11/09/2011 16:11

## 2014-09-07 NOTE — Consult Note (Signed)
Pt seen. See Dawn Harrison's notes. CT showed razor in descending colon. Given bowel prep last night. Did not start to respond until late last night. Not cleaned out at all this AM. Usually has BM once a week. Suspect slow colonic transit. Will see if patient evacuates razor on own today. If not, will give more bowel prep today. If razor does not move, then plan colon tomorrow. THanks.  Electronic Signatures: Verdie Shire (MD)  (Signed on 25-Jun-13 09:25)  Authored  Last Updated: 25-Jun-13 09:25 by Verdie Shire (MD)

## 2014-09-08 LAB — SURGICAL PATHOLOGY

## 2014-09-14 NOTE — H&P (Signed)
   Subjective/Chief Complaint Ingestion of foreign body x 2   History of Present Illness 24 yo F with history of schizoaffective disorder, multiple laparotomies for foreign body ingestion (last 06/2013 by myself) who presents after ingestion of two safety pins, one open, one closed.  She was distraught at the thought of being discharged from her group home into independent living.  No abd pain.  No fevers/chills, night sweats, shortness of breath, chest pain, nausea/vomiting, diarrhea/constipation, dysuria/hematuria.  Has a legal guardian who makes medical decisions for her.   Past History Schizoaffective disorder H/o laparotomy x 2 for foreign body removal HTN Hypercholesterolemia DM GERD s/p lumpectomy Asthma   Past Medical Health Hypertension, Diabetes Mellitus   Code Status Full Code   Past Med/Surgical Hx:  Schizoaffective DO:   asthma:   elevated cholesterol:   HTN:   Suicide Attempt:   gerd:   Diabetes:   foreign body removed from abd:   Lumpectomy:   ALLERGIES:  Betadine: Anaphylaxis  Iodine: Anaphylaxis  Shellfish: Anaphylaxis  Seafood: Anaphylaxis  Family and Social History:  Family History Hypertension  Diabetes Mellitus  Cancer   Social History positive  tobacco, negative ETOH, negative Illicit drugs, 1/2 ppd   Place of Living Group home   Review of Systems:  Subjective/Chief Complaint Complete review of systems obtained.  Pertinent positives and negatives as above   Medications/Allergies Reviewed Medications/Allergies reviewed   Physical Exam:  GEN well developed, well nourished, no acute distress, obese   HEENT pink conjunctivae, PERRL, hearing intact to voice, good dentition   RESP normal resp effort  clear BS   CARD regular rate  No LE edema  no JVD   ABD denies tenderness  soft  normal BS   EXTR negative cyanosis/clubbing, negative edema   SKIN normal to palpation, positive rashes, No rashes, No ulcers, skin turgor good   NEURO cranial  nerves intact, negative rigidity, negative tremor, follows commands, motor/sensory function intact   PSYCH A+O to time, place, person, good insight    Assessment/Admission Diagnosis 24 yo with schizoaffective disorder, h/o laparotomies for foreign body ingestion who presents following foreign body ingestion.  Per CT report, appear to be in small bowel but very proximal to colon and may in fact be in colon.  No tachycardia, peritoneal signs.  Labs unremarkable   Plan Admit, NPO, IVF.  Psych consult.  Probable laxative to attempt to pass objects.  Serial KUB.  Possible CT in a few days to eval for progression.   Electronic Signatures: Floyde Parkins (MD)  (Signed 04-Apr-16 00:47)  Authored: CHIEF COMPLAINT and HISTORY, PAST MEDICAL/SURGIAL HISTORY, ALLERGIES, FAMILY AND SOCIAL HISTORY, REVIEW OF SYSTEMS, PHYSICAL EXAM, ASSESSMENT AND PLAN   Last Updated: 04-Apr-16 00:47 by Floyde Parkins (MD)

## 2014-09-14 NOTE — Consult Note (Signed)
Psychiatry: Patient seen. She states this evening she is in more pain in her lower abdomen. Mood is good. Denies any suicidal thoughts. Affect calm and thoughts appear clear. Cooperative with treatment. Denies any thought of self harm. Plan to continue current medication. Will follow.  Electronic Signatures: Dahlia Nifong, [name] (MD)  (Signed on 07-Apr-16 22:55)  Authored  Last Updated: 07-Apr-16 22:55 by [name] (MD)

## 2014-09-14 NOTE — Consult Note (Signed)
2 safety pins, one larger one that is open and one smaller one that is closed, were both located near cecum and successfully removed. Reg diet ordered. If remains stable, ok for discharge soon. Will sign off. Thanks.  Electronic Signatures: Verdie Shire (MD)  (Signed on 10-Apr-16 09:18)  Authored  Last Updated: 10-Apr-16 09:18 by Verdie Shire (MD)

## 2014-09-14 NOTE — Consult Note (Signed)
PATIENT NAME:  Melanie Bell, Melanie Bell MR#:  [id number] DATE OF BIRTH:  1990/05/24  DATE OF CONSULTATION:  08/23/2014  REFERRING PHYSICIAN:   CONSULTING PHYSICIAN:  Lupita Dawn. Candace Cruise, MD  REASON FOR REFERRAL: Foreign body.   DESCRIPTION: The patient is a 24 year old, white female, with schizoaffective disorder, who has had recurrent ingestion of a foreign body. About 4 days ago, she swallowed 2 safety pins; one was open and one was closed. The patient was admitted to the surgical service. The patient had a foreign body removed from her cecum in 2015 by Dr. Rexene Edison. Initially the safety pins were in the stomach. The patient was given some laxatives and even bowel prep for the past several days. Continued to move. Daily KUBs were done. The most recent KUB showed 2 safety pins in the right lower quadrant area, but has not moved in the past 24 hours despite the laxative use. Therefore, I was consulted to see whether we should continue to observe or possibly do a colonoscopy to remove them.   The patient currently has no abdominal pain, bleeding or nausea or vomiting. The patient has a rather flat affect and did not provide much history. There is no bleeding either. There is no chest pain or shortness of breath.   PAST MEDICAL HISTORY: Notable for laparotomy for foreign body removal. Other history includes hypertension, hypercholesterolemia, diabetes and reflux. The patient has had lumpectomy and history of asthma. Other history includes hypertension and diabetes.   ALLERGIES: SHE IS ALLERGIC TO BETADINE, IODINE, SHELLFISH AND SEAFOOD.   FAMILY HISTORY: Notable for diabetes and hypertension.   SOCIAL HISTORY: She smokes, denies alcohol use. She lives at a group home.   REVIEW OF SYMPTOMS: Were already mentioned above. No fevers or chills or weight changes. There is no chest pain or palpitations. No cough, no shortness of breath. There are no visual or hearing changes. There is no abdominal pain or change in  bowel habits. There is no gross bleeding or melena. No nausea or vomiting.   PHYSICAL EXAMINATION:  GENERAL: The patient appears to be in no acute distress.  VITAL SIGNS: She is afebrile. Vital signs are stable.  HEENT: Normocephalic, atraumatic head. Pupils are equally reactive. Throat was clear.  NECK: Supple.  CARDIAC: Revealed regular rhythm and rate without murmurs.  LUNGS: Clear bilaterally.  ABDOMEN: Normoactive bowel sounds, soft. There is some mid vertical scar in the midline where she had the surgery to remove the foreign bodies. She was soft, nontender throughout. There is no hepatosplenomegaly.  EXTREMITIES: Show no clubbing, cyanosis, or edema.   LABORATORY DATA: Electrolytes are completely normal, glucose 81, BUN 8, creatinine 0.86. This was on the date of admission on April 3. Liver enzymes are normal. White count is slightly elevated at 11.2, but it normalized by April 5 to 6.7. Hemoglobin is normal at 12.2. The rest of the laboratories were normal, as well.   ASSESSMENT AND PLAN: This is a patient with 2 foreign bodies; one is open, one is closed. This is remaining in the right lower quadrant area. I am concerned they may be either stuck in the terminal ileum or cecal area because she has not moved them, at least in the past 24 hours. We will give her some more bowel prep to see if it will help. The patient has a KUB pending in the morning. If it does not move, then we will set her up for a colonoscopy to try to localize where it is. Certainly, if  it is within the large intestine, I may be able to remove it. If it is further up in the small intestine, then the removal may not be possible.   Thank you for the referral.    ____________________________ Lupita Dawn. Candace Cruise, MD pyo:JT D: 08/24/2014 09:24:00 ET T: 08/24/2014 09:44:50 ET JOB#: [id number]  cc: Lupita Dawn. Candace Cruise, MD, <Dictator> Hulen Luster MD ELECTRONICALLY SIGNED 08/25/2014 14:35

## 2014-09-14 NOTE — Consult Note (Addendum)
PATIENT NAME:  Melanie Bell, Melanie Bell MR#:  [id number] DATE OF BIRTH:  [birth year]  DATE OF CONSULTATION:  09/02/2014  REFERRING PHYSICIAN:   CONSULTING PHYSICIAN:  [name], MD  INDENTIFYING INFORMATION AND REASON FOR CONSULTATION:  This is a 24 year old woman with a history of schizophrenia or schizoaffective disorder who presented to the hospital.   CHIEF COMPLAINT:  "Voices telling me to kill myself."   HISTORY OF PRESENT ILLNESS:  The patient says that she is hearing voices telling her to kill herself.  They have been going on constantly for two days now.  Her mood is feeling depressed.  She is starting to think that maybe she should really kill herself.  The patient is not able to identify any new stress that has happened.  Nothing has been making her feel particularly bad.  She says that as far as she knows she is still on her normal psychiatric medicine.  She thinks that maybe they could have not given her everything she is supposed to be prescribed when she was discharged the other day, but far as I can tell it looks like it was the same.  Mood has been up and down, but is currently depressed, not sleeping well.  The patient denies that she has attempted to harm herself and specifically denies that she has swallowed any kind of object.  She says that she has not spoken to her mother in a while and does not know of any new problems that her mother is having.  That is usually a trigger for emotional problems in her.   PAST PSYCHIATRIC HISTORY:  Patient with schizophrenia and behavior disorder.  She has a particular tendency to swallow inanimate common objects, particularly sharp and dangerous ones.  She was just recently in the hospital for an extended stay while they managed two safety pins that she had swallowed.  At times, the patient says that she does this to actually kill herself. She has been on multiple psychiatric medications.  Currently she is followed at [facility].  She  takes oxcarbazepine 150 mg twice a day, venlafaxine extended release 150 mg a day, lithium carbonate 300 mg in the morning and 600 mg at night, fluphenazine 10 mg at night and 2.5 mg 3 times a day, and Latuda 80 mg once a day.   PAST MEDICAL HISTORY:  Miraculously, she seems to have suffered no permanent injury from her multiple swallowing of sharp objects.  She has hypothyroidism and high blood pressure.   SOCIAL HISTORY:  Lives in a group home.  Closest relative is her mother who lives in [place]. I recall that recently there had been some discussion about her mother being sick, but the patient does not know of any update to it.   FAMILY HISTORY:  None identified.   SUBSTANCE ABUSE HISTORY:  Does not use alcohol or drugs.  No past history of alcohol or drug abuse.   REVIEW OF SYSTEMS:  Auditory hallucinations telling her to kill herself.  Otherwise, the rest of the review of systems is pretty much negative.   MENTAL STATUS EXAMINATION:  Neatly groomed woman looks her stated age, cooperative with the interview.  Good eye contact.  Normal psychomotor activity.  Speech is quiet, decreased in amount.  Affect is flat.  Mood is stated as being not good.  Thoughts are very slow and simple with thought blocking.  She endorses auditory hallucinations.  Denies visual hallucinations.  Says that she is not feeling particularly paranoid  and has no homicidal thoughts. The patient is alert and oriented x 4.  Can repeat 3 words immediately, remembers all 3 at 3 minutes.  Normal intelligence.  Judgment and insight reasonably intact.   CURRENT MEDICATIONS: Tramadol 50 mg every 8 hours as needed for pain, lisinopril 5 mg once a day, oxcarbazepine 150 mg twice a day, venlafaxine extended release 150 mg in the morning, lithium carbonate 300 mg in the morning and 600 mg at night, Prolixin 10 mg at night and 2.5 mg 3 times a day, Latuda 80 mg once a day with food, and levothyroxine 75 mcg a day.   ALLERGIES:   IODINE.  LABORATORY RESULTS:  Pregnancy test negative.  Salicylates were modestly elevated at 24, acetaminophen and alcohol negative.  Chemistry panel revealed low potassium 3.1, low CO2 at 20, blood sugar 107.  CBC shows an elevated white count at 12.8, otherwise unremarkable. Urinalysis unremarkable.  Drug screen negative.   VITAL SIGNS:  Blood pressure 116/75, respirations 20, pulse 76, temperature 98.1.   ASSESSMENT:  A 24 year old woman with a history of schizophrenia.  She presents to the hospital stating that she is having auditory hallucinations telling her to kill herself.  There is no clear reason or stress that is causing this.  As far as we can tell she has been on her medicine and does not report any emotional stress.   Nevertheless, she is actually threatening to harm herself. The patient needs hospitalization for safety.   TREATMENT PLAN:  Admit to psychiatry.  Continue usual medicine.  Suicide precautions in place.  Psychoeducation with the patient and supportive counseling.   DIAGNOSIS, PRINCIPAL AND PRIMARY: AXIS I:  Schizophrenia.   SECONDARY DIAGNOSES:  AXIS I:  No further.  AXIS II:  Deferred.  AXIS III:  High blood pressure and hypothyroid.    ____________________________ [name], MD [id number] D: 09/02/2014 18:39:08 ET T: 09/02/2014 18:50:52 ET JOB#: [id number]  cc: [name], MD, <Dictator> [name] MD ELECTRONICALLY SIGNED 09/19/2014 19:30

## 2014-09-14 NOTE — Discharge Summary (Signed)
PATIENT NAME:  Melanie Bell, Melanie Bell MR#:  [id number] DATE OF BIRTH:  [birth year]  DATE OF ADMISSION:  08/18/2014 DATE OF DISCHARGE:  08/25/2014  DISCHARGE DIAGNOSES:  1.  Foreign body ingestion x 2.  2.  History of laparotomy for foreign body ingestion x 2.  3.  Schizoaffective disorder.  4.  Hypertension. 5.  Hypercholesterolemia.  6.  Diabetes mellitus.  7.  Gastroesophageal reflux disease.  8.  History of lumpectomy. 9.  History of asthma.   MEDICATIONS:  Lisinopril 5 mg p.o. daily, oxcarbazepine 150 p.o. b.i.d., Effexor 150 mg p.o. daily, lithium 300 mg p.o. daily, fluphenazine 10 p.o. at bedtime, and fluphenazine 2.5 t.i.d.,  lurasidone 10 mg p.o. at bedtime, l-thyroxine 75 mcg p.o. daily, tramadol 50 mg p.o. q. 8 hours p.r.n. pain, Lithium 600 mg p.o. at bedtime.  INDICATION FOR ADMISSION:  [name] is a pleasant 24 year old with history of multiple psychiatric issues who presents after foreign body ingestion of safety pins,  one of which was open.   HOSPITAL COURSE:  [name] was given laxatives and it was found that safety pins looked like they progressed to her cecum.  Colonoscopy was then performed and these were retrieved and then she was discharged home the next day.  At the time of discharge she was taking good p.o. with minimal pain.   DISCHARGE INSTRUCTIONS:  [name] is to follow up in approximately one week.  She is to call or follow up with her PCP.  She is to call or return to the ED if she has increased pain, nausea, vomiting, or diarrhea.   ____________________________ Melanie Norfolk. [name], MD [id number] D: 08/30/2014 11:13:00 ET T: 08/30/2014 14:19:52 ET JOB#: [id number]  cc: Harrell Gave A. Wilhelm Ganaway, MD, <Dictator> Floyde Parkins MD ELECTRONICALLY SIGNED 09/03/2014 12:46
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
## 2014-09-14 NOTE — Consult Note (Signed)
PATIENT NAME:  Melanie Bell, Melanie Bell MR#:  [id number] DATE OF BIRTH:  [birth year]  DATE OF CONSULTATION:  08/18/2014  REFERRING PHYSICIAN:   CONSULTING PHYSICIAN:  [name], MD  IDENTIFYING INFORMATION AND REASON FOR CONSULTATION: This is a 24 year old woman with a history of schizophrenia or schizoaffective disorder, who is currently admitted to the medical or surgical service because of ingestion of a foreign body.   CHIEF COMPLAINT: "I'm okay."   HISTORY OF PRESENT ILLNESS: Information from the patient and the old chart, and my previous appointments with this patient. She came into the hospital on this occasion, today, early in the morning after reporting to her group home that she had swallowed 2 open safety pins. This is a typical behavior of this patient, who has a long history of ingestion of foreign bodies, including having ingested open safety pins in the past. She tells me that she had not been planning it out, that it was a fairly impulsive thing. She does admit that she has been more anxious recently. Her anxiety, in her mind, is mostly tied to the fact that there has been a plan under discussion for her to move into independent living. Actually wants to do this, but at the same time, feels like she is not ready for it. She denies that she has been having any auditory hallucinations. Denies any changes in her medicines. Says that she has been compliant with all of her usual psychiatric medicine. Denies any drug or alcohol abuse. She has not been sleeping as well as usual for a couple of weeks. She has had a normal appetite. She does not report having had any suicidal thoughts in particular and certainly, no homicidal thoughts.   PAST PSYCHIATRIC HISTORY: The patient has a history of schizophrenia and has had multiple admissions to the hospital in the past. Most often, these are under the same circumstances that we have now, that she ingests foreign bodies. Sometime she will ingest things  that do not seem to be an immediate risk to herself and that do not require hospitalization, but she has ingested open safety pins and other sharp objects in the past. She tells me that she does this "to get attention." Also, finds that it helps to soothe her anxiety a little bit. She has a history of this kind of behavior, sometimes with suicidal intent, but not regularly. No history of violence. She has been stable on her current medications for a long time. She will occasionally present with complaints of hallucinations, but usually, those are transient and improve on their own.   PAST MEDICAL HISTORY: The patient has had 2 laparotomies in the past for foreign body removal, has a history of hypertension, elevated cholesterol, diabetes, gastric reflux symptoms, asthma, a history of having had a lumpectomy in the past.   SOCIAL HISTORY: Resides in a group home. She has been there for quite a while. Gets along there fairly well. Her mother is still living and is her legal guardian. Mother lives in [place], however, and does not have regular contact anymore, at least not face-to-face. The patient, otherwise, has pretty limited social contact.   FAMILY HISTORY: Reports a family history of depression.   SUBSTANCE ABUSE HISTORY: Does not drink, does not abuse any drugs. No past history of alcohol or drug abuse.   CURRENT MEDICATIONS: Prolixin 10 mg every night and 2.5 mg 3 times a day, Synthroid 75 mcg once a day, Zestril 5 mg a day, lithium carbonate 600 mg  at night with (Dictation Anomaly)<<latudaMISSING TEXT>>80 mg in the morning with breakfast, Trileptal 150 mg twice a day, Protonix daily, MiraLax daily, Effexor-XR 150 mg daily.   ALLERGIES: BETADINE, IODINE, SEAFOODS, SHELLFISH.   REVIEW OF SYSTEMS: Currently, the patient has minimal physical complaints. A little bit of abdominal pain that is very vague. No dizziness, no fever or chills. No pulmonary or cardiac symptoms. She denies any suicidal  thoughts or homicidal thoughts, denies any hallucinations.   MENTAL STATUS EXAMINATION: This is a healthy-appearing, 24 year old woman, interviewed in her hospital bed. Eye contact: Good. Psychomotor activity: Limited, but not extremely so. Speech was decreased in total amount, but answered questions completely. Mood was stated as being nervous. Thoughts were generally lucid. Denied auditory or visual hallucinations. Denied suicidal or homicidal ideation. Can repeat 3 words immediately, remembered all 3 at 3 minutes. Intelligence: Normal. Judgment and insight: Regarding her sharp object ingestion, is poor. Fund of knowledge: Normal. Alert and oriented x 4.   LABORATORY RESULTS: Salicylates, acetaminophen, and alcohol all negative. CBC shows a white count elevated at 11.2. Chemistry panel: Negative. Pregnancy test: Negative. Urinalysis: Unremarkable. The KUB showed 1 safety pin in the left lower quadrant, done on April 3. A repeat one was done today. This time, showed 2 safety pins, no evidence of bowel obstruction.   VITAL SIGNS: Blood pressure 113/75, respirations 18, pulse 76, temperature 98.6.   ASSESSMENT: A 24 year old woman with schizophrenia, who happens to have a unfortunate behavioral habit of ingesting foreign bodies impulsively when she is anxious. She does not have any acute suicidal intent. Her affect and mood appear to be calm right now. The patient is willing to cooperate with treatment. She is on her correct medicine. I do not think there is any reason to think that changing any of it would make any difference to her outcome.   TREATMENT PLAN:  1. Continue current psychiatric medicine. I think she can be taken off involuntary commitment and I am going to do so. Sitter can be discontinued. I will follow up daily.  2. The patient can go back to her group home once she is well.   DIAGNOSIS, PRINCIPAL AND PRIMARY:  AXIS I: Schizophrenia.   SECONDARY DIAGNOSES: AXIS I: No further.  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  AXIS III: Foreign body ingestion, hypothyroid.      ____________________________ [name], MD jtc:[id number] D: 08/18/2014 15:52:08 ET T: 08/18/2014 16:12:50 ET JOB#: [id number]  cc: [name], MD, <Dictator> [name] MD ELECTRONICALLY SIGNED 08/19/2014 22:36

## 2014-09-14 NOTE — Discharge Summary (Signed)
PATIENT NAME:  Melanie Bell, BASIC MR#:  [id number] DATE OF BIRTH:  14-Dec-1990  DATE OF ADMISSION:  08/18/2014 DATE OF DISCHARGE:  08/25/2014  DISCHARGE DIAGNOSES:  1.  Foreign body ingestion x 2.  2.  History of laparotomy for foreign body ingestion x 2.  3.  Schizoaffective disorder.  4.  Hypertension. 5.  Hypercholesterolemia.  6.  Diabetes mellitus.  7.  Gastroesophageal reflux disease.  8.  History of lumpectomy. 9.  History of asthma.   MEDICATIONS:  Lisinopril 5 mg p.o. daily, oxcarbazepine 150 p.o. b.i.d., Effexor 150 mg p.o. daily, lithium 300 mg p.o. daily, fluphenazine 10 p.o. at bedtime, and fluphenazine 2.5 t.i.d.,  lurasidone 10 mg p.o. at bedtime, l-thyroxine 75 mcg p.o. daily, tramadol 50 mg p.o. q. 8 hours p.r.n. pain, Lithium 600 mg p.o. at bedtime.  INDICATION FOR ADMISSION:  [name] is a pleasant 24 year old with history of multiple psychiatric issues who presents after foreign body ingestion of safety pins,  one of which was open.   HOSPITAL COURSE:  [name] was given laxatives and it was found that safety pins looked like they progressed to her cecum.  Colonoscopy was then performed and these were retrieved and then she was discharged home the next day.  At the time of discharge she was taking good p.o. with minimal pain.   DISCHARGE INSTRUCTIONS:  [name] is to follow up in approximately one week.  She is to call or follow up with her PCP.  She is to call or return to the ED if she has increased pain, nausea, vomiting, or diarrhea.   ____________________________ Glena Norfolk. Zechariah Bissonnette, MD [id number] D: 08/30/2014 11:13:26 ET T: 08/30/2014 14:19:52 ET JOB#: [id number]  cc: Harrell Gave A. Harjit Leider, MD, <Dictator>

## 2014-09-14 NOTE — Consult Note (Signed)
Psychiatry: Follow-up for this patient with schizophrenia and personality disorder.  Patient today has no new complaints.  Says that her mood is feeling stable.  She has not yet had a bowel movement.  Not having any emergent abdominal pain. review of systems she has no complaints about her mood no psychiatric complaints at all physical review of systems unremarkable. mental status the patient is awake and alert.  Good eye contact.  Flattish affect.  Mood stated as all right.  Thought simple but clear.  Totally denies suicidal ideation and also denies any intention or thoughts of swallowing foreign bodies.  Seems appropriate about wanting to cooperate with treatment. was informed that she was off of suicide precautions and no longer had a sitter.  She agreed that she would not entertain the thought of swallowing foreign bodies and that if it did, she would immediately notify someone rather than actually acting on it.  No change to current medicine.  Will follow-up as needed.  Electronic Signatures: [name], [name] (MD)  (Signed on 05-Apr-16 17:06)  Authored  Last Updated: 05-Apr-16 17:06 by [name] (MD)

## 2014-09-14 NOTE — Consult Note (Signed)
Pt seen and examined. Full consult to follow. Pt with psych disorder and recurrent ingestion of foreign body. S/P foreign body removal from cecum in 2015. Now with 2 safety pins. They have gradually moved to RLQ area. Has not moved further in last 24-48 hrs or so despite laxatives. Will give more bowel prep today. Recheck KUB in AM. If no change, then will consider colonoscopy to evaluate cecum and TI and attempt foreign body removal. thanks.   Electronic Signatures: Verdie Shire (MD) (Signed on 09-Apr-16 10:25)  Authored   Last Updated: 09-Apr-16 10:29 by Verdie Shire (MD)
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
## 2014-09-29 ENCOUNTER — Encounter: Payer: Self-pay | Admitting: Emergency Medicine

## 2014-09-29 ENCOUNTER — Emergency Department
Admission: EM | Admit: 2014-09-29 | Discharge: 2014-09-30 | Disposition: A | Discharge status: Home / Self Care | Payer: Medicaid Other | Attending: Emergency Medicine | Admitting: Emergency Medicine

## 2014-09-29 ENCOUNTER — Emergency Department: Payer: Medicaid Other

## 2014-09-29 DIAGNOSIS — R45851 Suicidal ideations: Secondary | ICD-10-CM | POA: Diagnosis not present

## 2014-09-29 DIAGNOSIS — E039 Hypothyroidism, unspecified: Secondary | ICD-10-CM

## 2014-09-29 DIAGNOSIS — R440 Auditory hallucinations: Secondary | ICD-10-CM | POA: Diagnosis not present

## 2014-09-29 DIAGNOSIS — I10 Essential (primary) hypertension: Secondary | ICD-10-CM | POA: Insufficient documentation

## 2014-09-29 DIAGNOSIS — R443 Hallucinations, unspecified: Secondary | ICD-10-CM | POA: Diagnosis present

## 2014-09-29 DIAGNOSIS — Z3202 Encounter for pregnancy test, result negative: Secondary | ICD-10-CM | POA: Insufficient documentation

## 2014-09-29 DIAGNOSIS — K5900 Constipation, unspecified: Secondary | ICD-10-CM

## 2014-09-29 DIAGNOSIS — Z720 Tobacco use: Secondary | ICD-10-CM | POA: Diagnosis not present

## 2014-09-29 DIAGNOSIS — R4589 Other symptoms and signs involving emotional state: Secondary | ICD-10-CM

## 2014-09-29 DIAGNOSIS — Z87821 Personal history of retained foreign body fully removed: Secondary | ICD-10-CM

## 2014-09-29 HISTORY — DX: Depression, unspecified: F32.A

## 2014-09-29 HISTORY — DX: Major depressive disorder, single episode, unspecified: F32.9

## 2014-09-29 HISTORY — DX: Hallucinations, unspecified: R44.3

## 2014-09-29 HISTORY — DX: Anxiety disorder, unspecified: F41.9

## 2014-09-29 LAB — COMPREHENSIVE METABOLIC PANEL
ALT: 6 U/L — ABNORMAL LOW (ref 14–54)
ANION GAP: 5 (ref 5–15)
AST: 14 U/L — ABNORMAL LOW (ref 15–41)
Albumin: 4.1 g/dL (ref 3.5–5.0)
Alkaline Phosphatase: 80 U/L (ref 38–126)
BILIRUBIN TOTAL: 0.3 mg/dL (ref 0.3–1.2)
BUN: 7 mg/dL (ref 6–20)
CO2: 25 mmol/L (ref 22–32)
Calcium: 9.6 mg/dL (ref 8.9–10.3)
Chloride: 107 mmol/L (ref 101–111)
Creatinine, Ser: 0.66 mg/dL (ref 0.44–1.00)
GLUCOSE: 87 mg/dL (ref 65–99)
Potassium: 3.8 mmol/L (ref 3.5–5.1)
Sodium: 137 mmol/L (ref 135–145)
Total Protein: 7.1 g/dL (ref 6.5–8.1)

## 2014-09-29 LAB — POCT PREGNANCY, URINE: PREG TEST UR: NEGATIVE

## 2014-09-29 LAB — CBC
HCT: 38.5 % (ref 35.0–47.0)
HEMOGLOBIN: 12.6 g/dL (ref 12.0–16.0)
MCH: 30.3 pg (ref 26.0–34.0)
MCHC: 32.7 g/dL (ref 32.0–36.0)
MCV: 92.5 fL (ref 80.0–100.0)
Platelets: 222 10*3/uL (ref 150–440)
RBC: 4.17 MIL/uL (ref 3.80–5.20)
RDW: 13.6 % (ref 11.5–14.5)
WBC: 8.1 10*3/uL (ref 3.6–11.0)

## 2014-09-29 LAB — URINE DRUG SCREEN, QUALITATIVE (ARMC ONLY)
AMPHETAMINES, UR SCREEN: NOT DETECTED
BENZODIAZEPINE, UR SCRN: NOT DETECTED
Barbiturates, Ur Screen: NOT DETECTED
COCAINE METABOLITE, UR ~~LOC~~: NOT DETECTED
Cannabinoid 50 Ng, Ur ~~LOC~~: NOT DETECTED
MDMA (Ecstasy)Ur Screen: NOT DETECTED
Methadone Scn, Ur: NOT DETECTED
Opiate, Ur Screen: NOT DETECTED
Phencyclidine (PCP) Ur S: NOT DETECTED
Tricyclic, Ur Screen: NOT DETECTED

## 2014-09-29 IMAGING — DX DG ABDOMEN 1V
1 series · 1 of 1 positions shown · non-contrast
Comparison: Single view of the abdomen [DATE].

CLINICAL DATA: Altered mental status. History of swallowing safety
pins.

EXAM:
ABDOMEN - 1 VIEW

[abdomen kub]
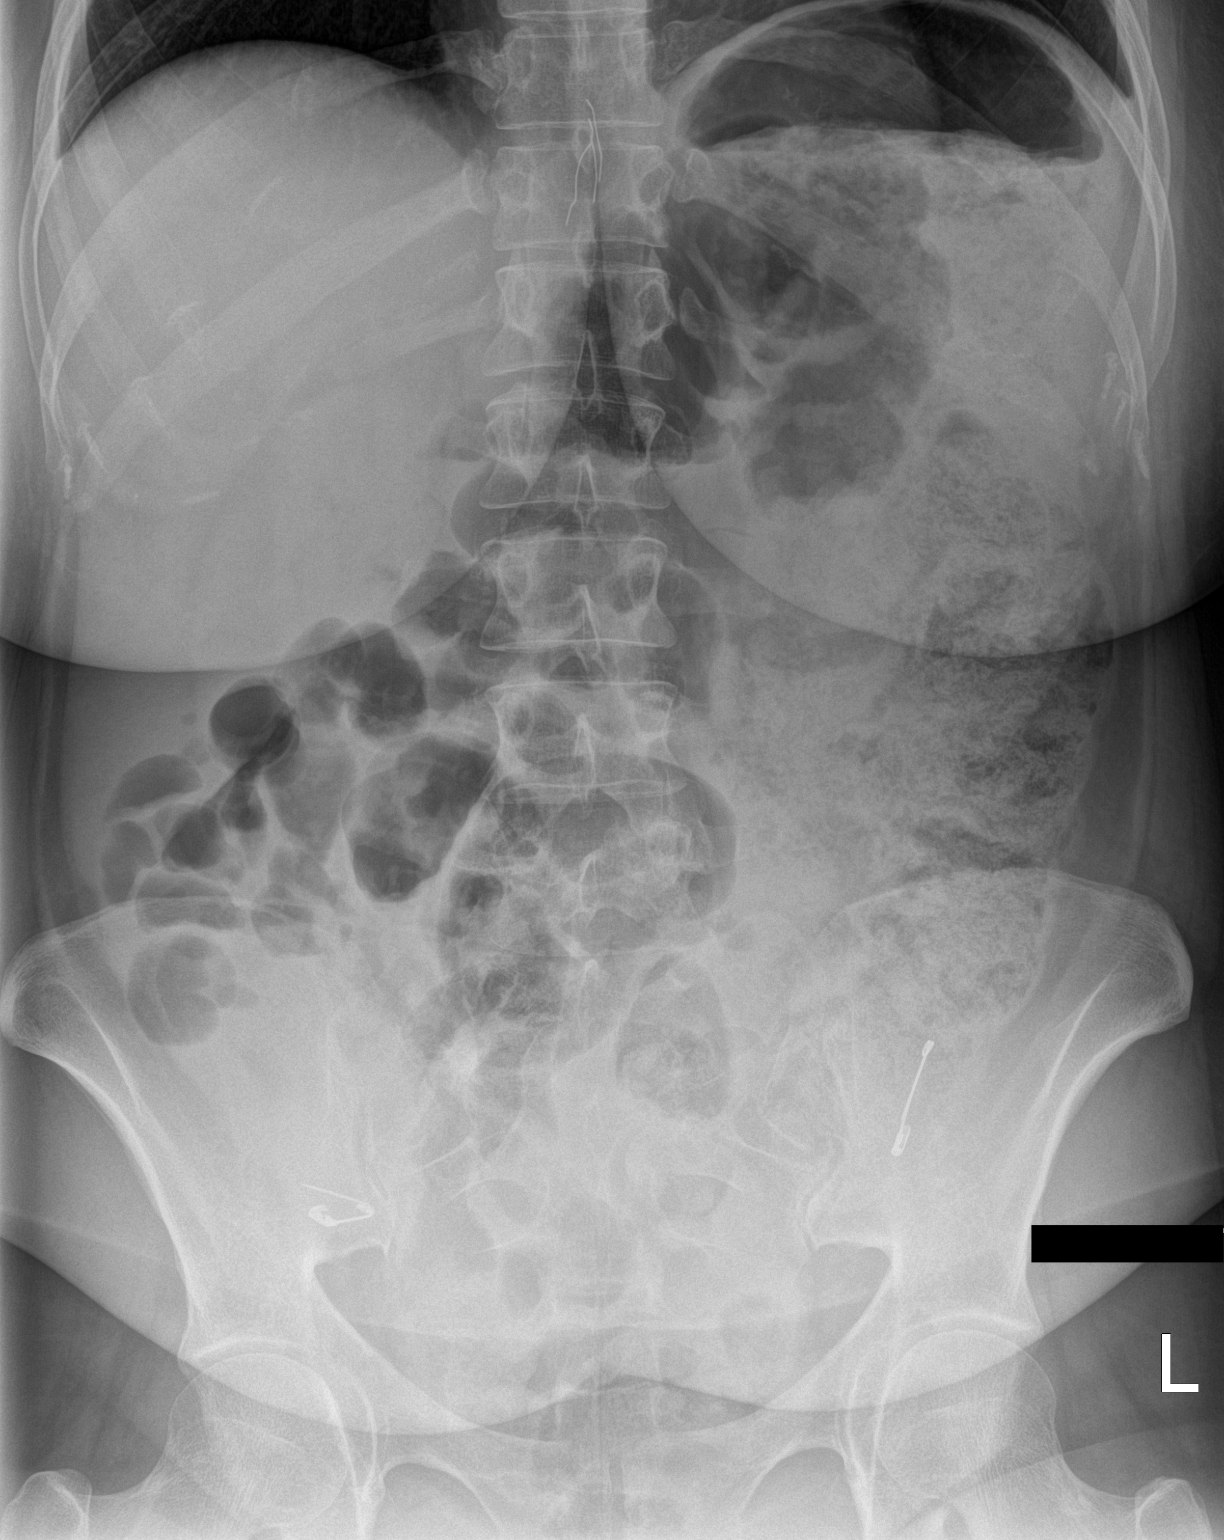

[1 of 1 positions shown; findings below may reference images not displayed]

FINDINGS: Two safety pins are again identified. One of these has progressed
and now projects over the distal descending colon. The second
projects in the right lower quadrant of the abdomen and may be in
the distal ileum. No new foreign body is identified. There is a
massive volume of stool in the transverse and descending colon. No
evidence of small bowel obstruction is seen.
IMPRESSION: Two safety pins are again identified and show progress through the
bowel as described above. No new foreign body is seen.

Massive volume of stool transverse and descending colon.

## 2014-09-29 IMAGING — DX DG CHEST 1V
1 series · 1 of 1 positions shown · non-contrast
Comparison: None.

CLINICAL DATA: Possible sharp object ingestion. Swallowed razor
blades. Hearing places for 2 days.

EXAM:
CHEST  1 VIEW

[chest pa]
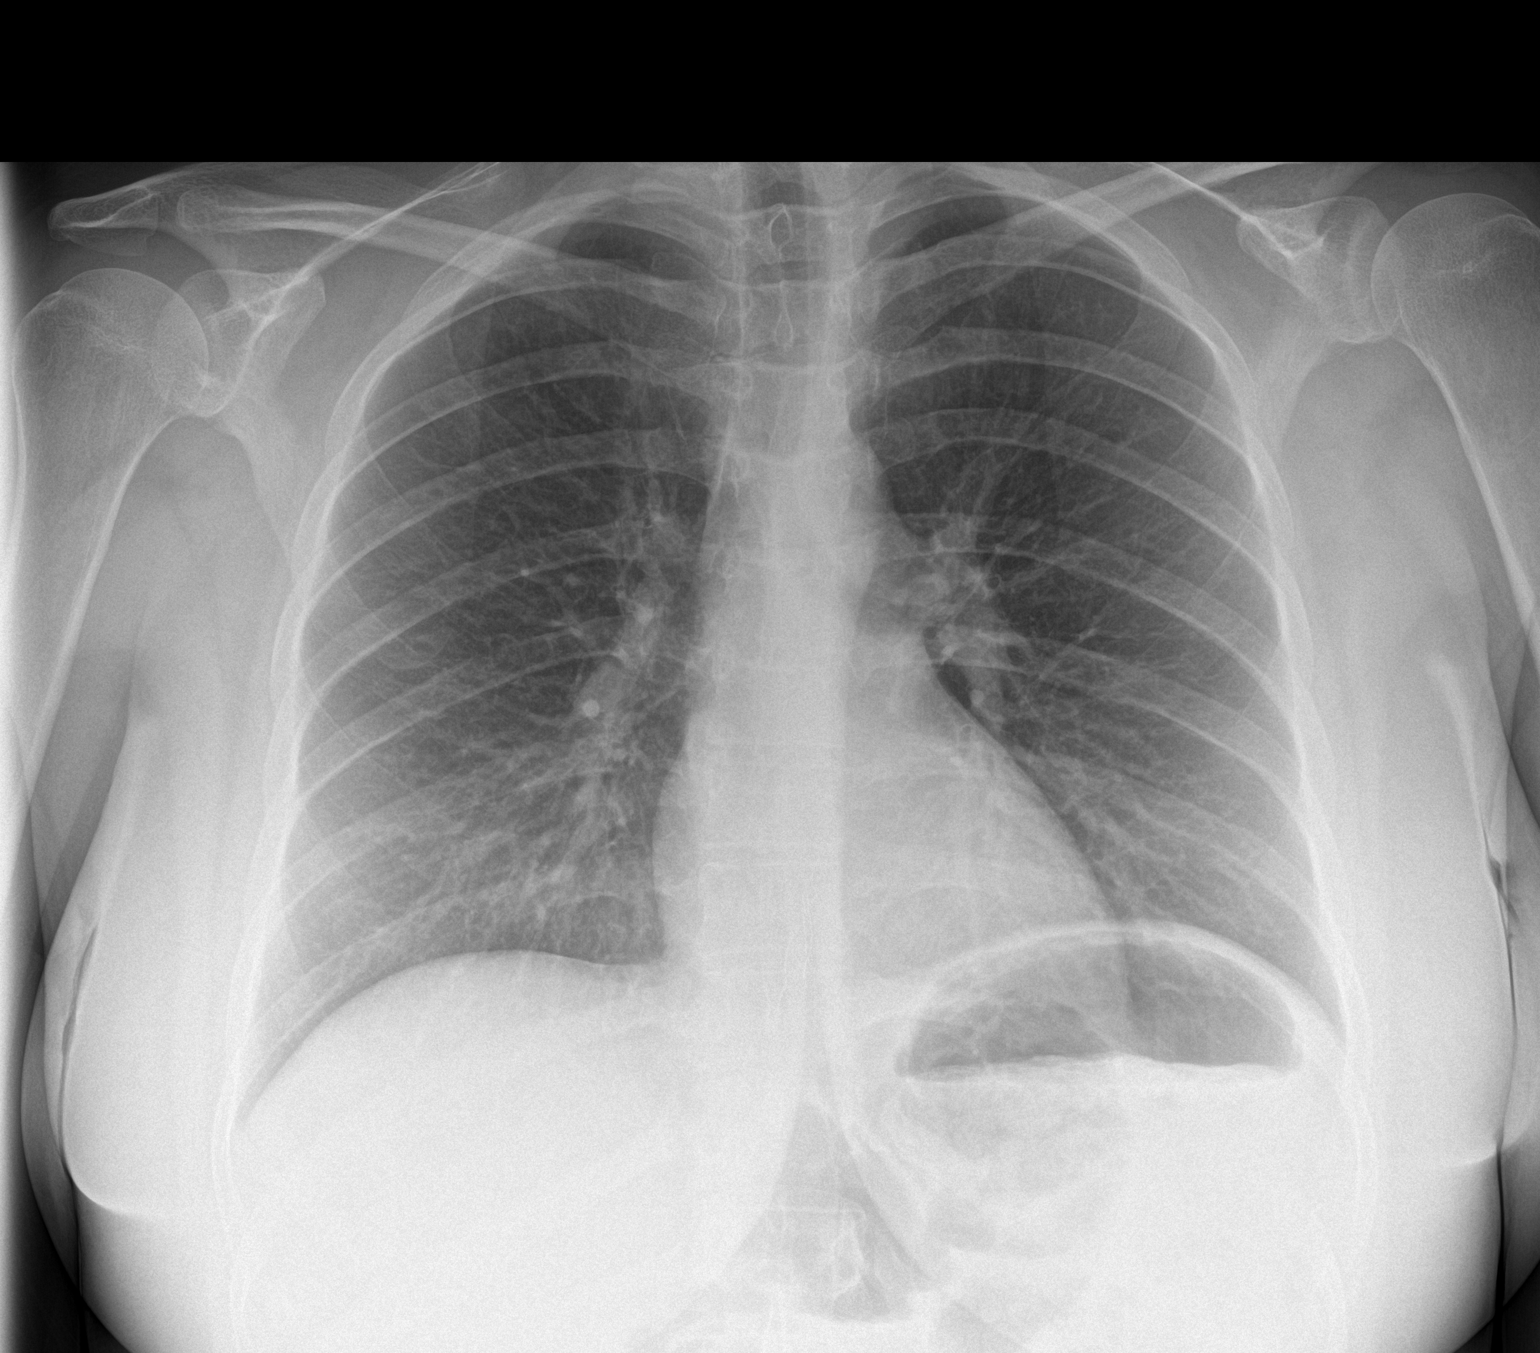

[1 of 1 positions shown; findings below may reference images not displayed]

FINDINGS: The heart size and mediastinal contours are within normal limits.
Both lungs are clear. The visualized skeletal structures are
unremarkable. No radiopaque foreign body.
IMPRESSION: No active disease.

## 2014-09-29 NOTE — ED Provider Notes (Signed)
[facility] Emergency Department Provider Note  ____________________________________________  Time seen: Approximately 12:21 PM  I have reviewed the triage vital signs and the nursing notes.   HISTORY  Chief Complaint Hallucinations    HPI Melanie Bell is a 24 y.o. female with a history of anxiety, depression, and hallucinationswho presents with 3 days of persistent auditory command hallucinations that are telling her to swallow sharp objects to hurt herself.  She states that she does not want to do so, but she is worried she will because of the commands.  She has had similar issues in the past.  The symptoms are severe.  She states that she has been taking her regular medications.   Past Medical History  Diagnosis Date  . Hallucinations   . Depression   . Anxiety     Patient Active Problem List   Diagnosis Date Noted  . Schizophrenia 09/29/2014  . H/O foreign body ingestion 09/29/2014  . Other social stressor 09/29/2014  . Hypothyroid 09/29/2014  . Hypertension 09/29/2014  . Constipation 09/29/2014  . Auditory hallucinations   . Thoughts of self harm     Past Surgical History  Procedure Laterality Date  . Breast lumpectomy    . Wisdom tooth extraction      No current outpatient prescriptions on file.  Allergies Betadine and Iodine  Family History  Problem Relation Age of Onset  . Depression Mother   . Hypertension Mother     Social History History  Substance Use Topics  . Smoking status: Current Every Day Smoker  . Smokeless tobacco: Not on file  . Alcohol Use: No    Review of Systems Constitutional: No fever/chills Eyes: No visual changes. ENT: No sore throat. Cardiovascular: Denies chest pain. Respiratory: Denies shortness of breath. Gastrointestinal: No abdominal pain.  No nausea, no vomiting.  No diarrhea.  No constipation. Genitourinary: Negative for dysuria. Musculoskeletal: Negative for back pain. Skin:  Negative for rash. Neurological: Negative for headaches, focal weakness or numbness. Psychiatric:Endorses auditory command hallucinations to hurt herself 10-point ROS otherwise negative.  ____________________________________________   PHYSICAL EXAM:  VITAL SIGNS: ED Triage Vitals  Enc Vitals Group     BP 09/29/14 0916 116/76 mmHg     Pulse Rate 09/29/14 0916 69     Resp 09/29/14 0916 18     Temp 09/29/14 0916 98.2 F (36.8 C)     Temp Source 09/29/14 0916 Oral     SpO2 09/29/14 0916 98 %     Weight 09/29/14 0916 220 lb (99.791 kg)     Height 09/29/14 0916 5\' 6"  (1.676 m)     Head Cir --      Peak Flow --      Pain Score 09/29/14 0917 0     Pain Loc --      Pain Edu? --      Excl. in [place]? --     Constitutional: Alert and oriented. Well appearing and in no acute distress. Eyes: Conjunctivae are normal. PERRL. EOMI. Head: Atraumatic. Nose: No congestion/rhinnorhea. Mouth/Throat: Mucous membranes are moist.  Oropharynx non-erythematous. Neck: No stridor.   Cardiovascular: Normal rate, regular rhythm. Grossly normal heart sounds.  Good peripheral circulation. Respiratory: Normal respiratory effort.  No retractions. Lungs CTAB. Gastrointestinal: Soft and nontender. No distention. No abdominal bruits. No CVA tenderness. Musculoskeletal: No lower extremity tenderness nor edema.  No joint effusions. Neurologic:  Normal speech and language. No gross focal neurologic deficits are appreciated. Speech is normal. No gait instability. Skin:  Skin is warm, dry and intact. No rash noted. Psychiatric: The patient has a flat affect. Speech and behavior are normal.  Endorses thoughts of hurting herself and command hallucinations  ____________________________________________   LABS (all labs ordered are listed, but only abnormal results are displayed)  Labs Reviewed  COMPREHENSIVE METABOLIC PANEL - Abnormal; Notable for the following:    AST 14 (*)    ALT 6 (*)    All other components  within normal limits  CBC  URINE DRUG SCREEN, QUALITATIVE ([facility])  POC URINE PREG, ED  POCT PREGNANCY, URINE   ____________________________________________  EKG  Not indicated ____________________________________________  RADIOLOGY  Dg Chest 1 View  09/29/2014   CLINICAL DATA:  Possible sharp object ingestion. Swallowed razor blades. Hearing places for 2 days.  EXAM: CHEST  1 VIEW  COMPARISON:  None.  FINDINGS: The heart size and mediastinal contours are within normal limits. Both lungs are clear. The visualized skeletal structures are unremarkable. No radiopaque foreign body.  IMPRESSION: No active disease.   Electronically Signed   By: Kathreen Devoid   On: 09/29/2014 13:05   Dg Abd 1 View  09/29/2014   CLINICAL DATA:  Altered mental status. History of swallowing safety pins.  EXAM: ABDOMEN - 1 VIEW  COMPARISON:  Single view of the abdomen 08/24/2014.  FINDINGS: Two safety pins are again identified. One of these has progressed and now projects over the distal descending colon. The second projects in the right lower quadrant of the abdomen and may be in the distal ileum. No new foreign body is identified. There is a massive volume of stool in the transverse and descending colon. No evidence of small bowel obstruction is seen.  IMPRESSION: Two safety pins are again identified and show progress through the bowel as described above. No new foreign body is seen.  Massive volume of stool transverse and descending colon.   Electronically Signed   By: Inge Rise M.D.   On: 09/29/2014 13:09    ____________________________________________   PROCEDURES  Procedure(s) performed: None  Critical Care performed: No  ____________________________________________   INITIAL IMPRESSION / ASSESSMENT AND PLAN / ED COURSE  Pertinent labs & imaging results that were available during my care of the patient were reviewed by me and considered in my medical decision making (see chart for  details).  Worsening command hallucinations to hurt herself.  Given the risk of a missed ingestion, I will obtain a 1 view chest and one view abdomen x-ray.  She will need psychiatric evaluation.  ----------------------------------------- 15:00 PM on 09/29/2014 -----------------------------------------  Patient remains stable in ED.  1-view abd x-ray notable for two safety pins which were present on prior films, and they show interval movement throughout colon.  Of note, the patient also has a "massive volume" of stool.  Her abdomen is non-tender to palpitation; no acute changes and no acute interventions required.  Psych consultation/dispo recommendations are pending. ____________________________________________   FINAL CLINICAL IMPRESSION(S) / ED DIAGNOSES  Final diagnoses:  Auditory hallucinations  Thoughts of self harm     Hinda Kehr, MD 09/29/14 2057

## 2014-09-29 NOTE — BH Assessment (Addendum)
Assessment Note  Melanie Bell is an 23 y.o. female Who presents to the ER due to hearing voices telling her to swallow sharp objects. Pt. has a history of A/H and they being in command in nature. According to the pt. she was recently at [facility] on the medical floor due to ingesting foreign objects. She states had to undergo a colonoscopy in order to remove the objects. She reports "the voices are telling me to do it again." She states, she doesn't want to swallow any objects but is unsure if she will be able to. She has history of poor impulse control and emotional regulation. Pt. currently denies SI/HI but continues to endorse A/H that are command in nature.  She is currently living in [place] and is says she has been there for five years. She gets alone with staff and with her roommate.   Axis I: Schizophrenia Axis III:  Past Medical History  Diagnosis Date  . Hallucinations   . Depression   . Anxiety    Axis IV: occupational problems, other psychosocial or environmental problems, problems related to social environment and problems with primary support group  Past Medical History:  Past Medical History  Diagnosis Date  . Hallucinations   . Depression   . Anxiety     Past Surgical History  Procedure Laterality Date  . Breast lumpectomy    . Wisdom tooth extraction      Family History:  Family History  Problem Relation Age of Onset  . Depression Mother   . Hypertension Mother     Social History:  reports that she has been smoking.  She does not have any smokeless tobacco history on file. She reports that she does not drink alcohol or use illicit drugs.  Additional Social History:  Alcohol / Drug Use Pain Medications: None Reported Prescriptions: None Reported Over the Counter: None Reported History of alcohol / drug use?: No history of alcohol / drug abuse Longest period of sobriety (when/how long): None Reported Negative Consequences of Use:  (None  Reported) Withdrawal Symptoms:  (None Reported)  CIWA: CIWA-Ar BP: 116/76 mmHg Pulse Rate: 69 COWS:    Allergies:  Allergies  Allergen Reactions  . Betadine [Povidone Iodine]   . Iodine Rash    Home Medications:  (Not in a hospital admission)  OB/GYN Status:  No LMP recorded. Patient has had an injection.  General Assessment Data Location of Assessment: [facility] ED TTS Assessment: In system Is this a Tele or Face-to-Face Assessment?: Face-to-Face Is this an Initial Assessment or a Re-assessment for this encounter?: Initial Assessment Marital status: Single Is patient pregnant?: No Pregnancy Status: No Living Arrangements: Group Home Can pt return to current living arrangement?: Yes Admission Status: Involuntary Is patient capable of signing voluntary admission?: No Referral Source: Self/Family/Friend (Group Home) Insurance type: [place] Screening Exam ([place]) Medical Exam completed: Yes  Crisis Care Plan Living Arrangements: Group Home  Education Status Is patient currently in school?: No Highest grade of school patient has completed: 12  Risk to self with the past 6 months Suicidal Ideation: No Has patient been a risk to self within the past 6 months prior to admission? : No Suicidal Intent: No Has patient had any suicidal intent within the past 6 months prior to admission? : No Is patient at risk for suicide?: Yes Suicidal Plan?: No Has patient had any suicidal plan within the past 6 months prior to admission? : No Access to Means: No What  has been your use of drugs/alcohol within the last 12 months?: None reported Previous Attempts/Gestures: Yes How many times?: 1 Other Self Harm Risks: History of swallowing objects Triggers for Past Attempts: None known Intentional Self Injurious Behavior: Damaging, Bruising (History of swallowing objects) Comment - Self Injurious Behavior: History of swallowing objects Family Suicide History:  No Recent stressful life event(s): Other (Comment) Persecutory voices/beliefs?: Yes Depression: No Depression Symptoms: Feeling worthless/self pity, Loss of interest in usual pleasures, Isolating Substance abuse history and/or treatment for substance abuse?: No Suicide prevention information given to non-admitted patients: Not applicable  Risk to Others within the past 6 months Homicidal Ideation: No Does patient have any lifetime risk of violence toward others beyond the six months prior to admission? : No Thoughts of Harm to Others: No Current Homicidal Intent: No Current Homicidal Plan: No Access to Homicidal Means: No Identified Victim: None reported History of harm to others?: No Assessment of Violence: None Noted Violent Behavior Description: None reported Does patient have access to weapons?: No Criminal Charges Pending?: No Does patient have a court date: No Is patient on probation?: No  Psychosis Hallucinations: With command, Auditory Delusions: Persecutory  Mental Status Report Appearance/Hygiene: Disheveled, In hospital gown, In scrubs Eye Contact: Poor Motor Activity: Unremarkable, Freedom of movement Speech: Logical/coherent, Soft Level of Consciousness: Alert Mood: Anxious, Sad Affect: Sad, Appropriate to circumstance Anxiety Level: Moderate Thought Processes: Coherent, Relevant Judgement: Impaired Orientation: Person, Place, Situation Obsessive Compulsive Thoughts/Behaviors: None  Cognitive Functioning Concentration: Decreased Memory: Recent Intact, Remote Intact IQ: Average Insight: Poor Impulse Control: Poor Appetite: Fair Weight Loss: 0 Weight Gain: 0 Sleep: No Change Total Hours of Sleep: 8 Vegetative Symptoms: None  ADLScreening [facility] Assessment Services) Patient's cognitive ability adequate to safely complete daily activities?: Yes Patient able to express need for assistance with ADLs?: Yes Independently performs ADLs?: Yes (appropriate  for developmental age)  Prior Inpatient Therapy Prior Inpatient Therapy: Yes Prior Therapy Dates: 08/2014 Prior Therapy Facilty/Provider(s): [facility] Medical [facility] Reason for Treatment: Swallowing objects  Prior Outpatient Therapy Prior Outpatient Therapy: Yes Prior Therapy Dates: Current Prior Therapy Facilty/Provider(s): Group Home Reason for Treatment: Unable to live independenlty Does patient have an ACCT team?: No Does patient have [facility] services? : No Does patient have P4[facility] services?: No  ADL Screening (condition at time of admission) Patient's cognitive ability adequate to safely complete daily activities?: Yes Patient able to express need for assistance with ADLs?: Yes Independently performs ADLs?: Yes (appropriate for developmental age)       Abuse/Neglect Assessment (Assessment to be complete while patient is alone) Physical Abuse: Denies Verbal Abuse: Denies Sexual Abuse: Denies Exploitation of patient/patient's resources: Denies Self-Neglect: Denies Values / Beliefs Cultural Requests During Hospitalization: None Spiritual Requests During Hospitalization: None Consults Spiritual Care Consult Needed: No Social Work Consult Needed: No Regulatory affairs officer (For Healthcare) Does patient have an advance directive?: No Would patient like information on creating an advanced directive?: Yes Higher education careers adviser given    Additional Information 1:1 In Past 12 Months?: No CIRT Risk: No Elopement Risk: No Does patient have medical clearance?: Yes  Child/Adolescent Assessment Running Away Risk: Denies (Pt. is an adult)  Disposition:  Disposition Initial Assessment Completed for this Encounter: Yes Disposition of Patient: Inpatient treatment program (Per Dr. Weber Cooks) Type of inpatient treatment program: Adult (Per Dr. Weber Cooks)  On Site Evaluation by:   Reviewed with Physician:    Gunnar Fusi, MS, LCAS, [place], [place], CCSI 09/29/2014 5:44 PM

## 2014-09-29 NOTE — ED Notes (Signed)
Per Dr. Archie Balboa, verbal order to discontinue order for sitter at bedside. Order to be carried out by this RN.

## 2014-09-29 NOTE — ED Notes (Signed)
BEHAVIORAL HEALTH ROUNDING Patient sleeping: No. Patient alert and oriented: yes Behavior appropriate: Yes.  ; If no, describe:  Nutrition and fluids offered: Yes  Toileting and hygiene offered: Yes  Sitter present: no Law enforcement present: Yes  

## 2014-09-29 NOTE — ED Notes (Signed)
BEHAVIORAL HEALTH ROUNDING Patient sleeping: Yes.   Patient alert and oriented: sleeping Behavior appropriate: Yes.  ; If no, describe: sleeping Nutrition and fluids offered: sleeping Toileting and hygiene offered: sleeping Sitter present: no Law enforcement present: Yes  and ODS 

## 2014-09-29 NOTE — ED Notes (Signed)

## 2014-09-29 NOTE — ED Notes (Signed)
Pt provided with sandwich tray and drink.  

## 2014-09-29 NOTE — ED Notes (Signed)
Pt to ed with c/o hearing voices x 2 days.  Pt states the voices are telling her to swallow sharpe things.  Pt has hx of same. Denies HI.

## 2014-09-29 NOTE — ED Notes (Signed)
ED [facility] [place] Is the patient under IVC or is there intent for IVC: Yes.   Is the patient medically cleared: Yes.   Is there vacancy in the ED BHU: Yes.   Is the population mix appropriate for patient: Yes.   Is the patient awaiting placement in inpatient or outpatient setting: unsure at this time  Has the patient had a psychiatric consult: No. Survey of unit performed for contraband, proper placement and condition of furniture, tampering with fixtures in bathroom, shower, and each patient room: Yes.  ; Findings:  APPEARANCE/BEHAVIOR cooperative NEURO ASSESSMENOrientation:AAO x 3 Hallucinations: No.None noted (Hallucinations) Speech: Normal Gait: normal RESPIRATORY ASSESSMENT Even and unlabored  CARDIOVASCULAR ASSESSMENT Heart rate wnl, skin warm and dry, skin color wnl GASTROINTESTINAL ASSESSMENT wnl EXTREMITIES Moves all extremities  PLAN OF CARE Provide calm/safe environment. Vital signs assessed twice daily. ED BHU Assessment once each 12-hour shift. Collaborate with intake RN daily or as condition indicates. Assure the ED provider has rounded once each shift. Provide and encourage hygiene. Provide redirection as needed. Assess for escalating behavior; address immediately and inform ED provider.  Assess family dynamic and appropriateness for visitation as needed: Yes.  ; If necessary, describe findings:  Educate the patient/family about BHU procedures/visitation: Yes.  ; If necessary, describe findings:

## 2014-09-29 NOTE — Consult Note (Signed)
[facility] Consult   Reason for Consult:  Consult for patient with a history of schizophrenia who is brought back to the hospital because of hallucinations and suicidal ideation Referring Physician:  forbach Patient Identification: Melanie Bell MRN:  [id number] Principal Diagnosis: Schizophrenia Diagnosis:   Patient Active Problem List   Diagnosis Date Noted  . Schizophrenia [F20.9] 09/29/2014  . H/O foreign body ingestion [Z87.821] 09/29/2014  . Other social stressor [Z65.9] 09/29/2014    Total Time spent with patient: 1 hour  Subjective:   Melanie Bell is a 24 y.o. female patient admitted with "the voices started again". She says that she's having voices and they're telling her to kill herself or to swallow objects. Feeling stressed. Mood is anxious and depressed.Marland Kitchen  HPI:  On interview today the patient says that on Saturday she started having auditory hallucinations again. They are upsetting and mean. Tell her that she should swallow inanimate objects. Make her feel like dying. She describes him as demonic. Her mood is very anxious and nervous. Can't sleep well at night. She says there is been a lot of drama at her group home. There is a new resident there who keeps trying to get her to use drugs and that is upsetting her. She did recently see her outpatient provider and she thinks that may be one of her medicines was discontinued but she can't remember which one it was. She denies that she's been abusing drugs or alcohol. She denies that she has swallowed any new inanimate objects or made any suicide attempts.  Patient has a long history of mental illness characterized by a tendency to swallow inanimate objects especially open safety pins and other things which she anticipates will do her harm. He was recently in the hospital after swallowing open safety pins with a follow-up psychiatric hospitalization. Has a history of suicidality history of psychotic  symptoms.  Social history is that the patient lives in a group home. Her closest relative is her mother who lives in [place]. Minimal other social contact  Medical history is a history of these multiple foreign body ingestions which required surgery at times in the past. She does not have significant other ongoing medical problems  Substance abuse history is negative HPI Elements:   Quality:  Auditory hallucinations with suicidal command content. Severity:  Severe. Timing:  Worse over the last 2 days. Duration:  Chronic intermittent. Context:  Stress in her group home.  Past Medical History:  Past Medical History  Diagnosis Date  . Hallucinations   . Depression   . Anxiety     Past Surgical History  Procedure Laterality Date  . Breast lumpectomy    . Wisdom tooth extraction     Family History:  Family History  Problem Relation Age of Onset  . Depression Mother   . Hypertension Mother    Social History:  History  Alcohol Use No     History  Drug Use No    History   Social History  . Marital Status: Single    Spouse Name: N/A  . Number of Children: N/A  . Years of Education: N/A   Social History Main Topics  . Smoking status: Current Every Day Smoker  . Smokeless tobacco: Not on file  . Alcohol Use: No  . Drug Use: No  . Sexual Activity: Not on file   Other Topics Concern  . None   Social History Narrative  . None   Additional Social History:  Allergies:   Allergies  Allergen Reactions  . Betadine [Povidone Iodine]   . Iodine Rash    Labs:  Results for orders placed or performed during the hospital encounter of 09/29/14 (from the past 48 hour(s))  Urine Drug Screen, Qualitative [facility])     Status: None   Collection Time: 09/29/14  9:30 AM  Result Value Ref Range   Tricyclic, Ur Screen NONE DETECTED NONE DETECTED   Amphetamines, Ur Screen NONE DETECTED NONE DETECTED   MDMA (Ecstasy)Ur Screen NONE DETECTED NONE  DETECTED   Cocaine Metabolite,Ur [place] NONE DETECTED NONE DETECTED   Opiate, Ur Screen NONE DETECTED NONE DETECTED   Phencyclidine (PCP) Ur S NONE DETECTED NONE DETECTED   Cannabinoid 50 Ng, Ur [place] NONE DETECTED NONE DETECTED   Barbiturates, Ur Screen NONE DETECTED NONE DETECTED   Benzodiazepine, Ur Scrn NONE DETECTED NONE DETECTED   Methadone Scn, Ur NONE DETECTED NONE DETECTED    Comment: (NOTE) 810  Tricyclics, urine               Cutoff 1000 ng/mL 200  Amphetamines, urine             Cutoff 1000 ng/mL 300  MDMA (Ecstasy), urine           Cutoff 500 ng/mL 400  Cocaine Metabolite, urine       Cutoff 300 ng/mL 500  Opiate, urine                   Cutoff 300 ng/mL 600  Phencyclidine (PCP), urine      Cutoff 25 ng/mL 700  Cannabinoid, urine              Cutoff 50 ng/mL 800  Barbiturates, urine             Cutoff 200 ng/mL 900  Benzodiazepine, urine           Cutoff 200 ng/mL 1000 Methadone, urine                Cutoff 300 ng/mL 1100 1200 The urine drug screen provides only a preliminary, unconfirmed 1300 analytical test result and should not be used for non-medical 1400 purposes. Clinical consideration and professional judgment should 1500 be applied to any positive drug screen result due to possible 1600 interfering substances. A more specific alternate chemical method 1700 must be used in order to obtain a confirmed analytical result.  1800 Gas chromato graphy / mass spectrometry (GC/MS) is the preferred 1900 confirmatory method.   CBC     Status: None   Collection Time: 09/29/14  9:39 AM  Result Value Ref Range   WBC 8.1 3.6 - 11.0 K/uL   RBC 4.17 3.80 - 5.20 MIL/uL   Hemoglobin 12.6 12.0 - 16.0 g/dL   HCT 38.5 35.0 - 47.0 %   MCV 92.5 80.0 - 100.0 fL   MCH 30.3 26.0 - 34.0 pg   MCHC 32.7 32.0 - 36.0 g/dL   RDW 13.6 11.5 - 14.5 %   Platelets 222 150 - 440 K/uL  Comprehensive metabolic panel     Status: Abnormal   Collection Time: 09/29/14  9:39 AM  Result Value Ref Range    Sodium 137 135 - 145 mmol/L   Potassium 3.8 3.5 - 5.1 mmol/L   Chloride 107 101 - 111 mmol/L   CO2 25 22 - 32 mmol/L   Glucose, Bld 87 65 - 99 mg/dL   BUN 7 6 - 20 mg/dL   Creatinine, Ser 0.66  0.44 - 1.00 mg/dL   Calcium 9.6 8.9 - 10.3 mg/dL   Total Protein 7.1 6.5 - 8.1 g/dL   Albumin 4.1 3.5 - 5.0 g/dL   AST 14 (L) 15 - 41 U/L   ALT 6 (L) 14 - 54 U/L   Alkaline Phosphatase 80 38 - 126 U/L   Total Bilirubin 0.3 0.3 - 1.2 mg/dL   GFR calc non Af Amer >60 >60 mL/min   GFR calc Af Amer >60 >60 mL/min    Comment: (NOTE) The eGFR has been calculated using the CKD EPI equation. This calculation has not been validated in all clinical situations. eGFR's persistently <60 mL/min signify possible Chronic Kidney Disease.    Anion gap 5 5 - 15  Pregnancy, urine POC     Status: None   Collection Time: 09/29/14  9:46 AM  Result Value Ref Range   Preg Test, Ur NEGATIVE NEGATIVE    Comment:        THE SENSITIVITY OF THIS METHODOLOGY IS >24 mIU/mL     Vitals: Blood pressure 116/76, pulse 69, temperature 98.2 F (36.8 C), temperature source Oral, resp. rate 18, height _0  (1.676 m), weight 99.791 kg (220 lb), SpO2 98 %.  Risk to Self: Is patient at risk for suicide?: Yes Risk to Others:   Prior Inpatient Therapy:   Prior Outpatient Therapy:    No current facility-administered medications for this encounter.   No current outpatient prescriptions on file.    Musculoskeletal: Strength & Muscle Tone: within normal limits Gait & Station: normal Patient leans: N/A  Psychiatric Specialty Exam: Physical Exam  Constitutional: She appears well-developed and well-nourished.  HENT:  Head: Normocephalic and atraumatic.  Eyes: Conjunctivae are normal. Pupils are equal, round, and reactive to light.  Neck: Normal range of motion.  Cardiovascular: Normal heart sounds.   Respiratory: Effort normal.  GI: Soft.  Musculoskeletal: Normal range of motion.  Neurological: She is alert.  Skin:  Skin is warm and dry.  Psychiatric: Her behavior is normal. Her affect is blunt. Her speech is delayed. Thought content is paranoid. Cognition and memory are normal. She expresses impulsivity. She expresses suicidal ideation.    Review of Systems  Constitutional: Negative.   HENT: Negative.   Eyes: Negative.   Respiratory: Negative.   Cardiovascular: Negative.   Gastrointestinal: Positive for constipation.  Musculoskeletal: Negative.   Skin: Negative.   Neurological: Negative.   Psychiatric/Behavioral: Positive for depression, suicidal ideas and hallucinations. Negative for memory loss and substance abuse. The patient is nervous/anxious and has insomnia.     Blood pressure 116/76, pulse 69, temperature 98.2 F (36.8 C), temperature source Oral, resp. rate 18, height _1  (1.676 m), weight 99.791 kg (220 lb), SpO2 98 %.Body mass index is 35.53 kg/(m^2).  General Appearance: Fairly Groomed  Engineer, water::  Good  Speech:  Slow  Volume:  Decreased  Mood:  Anxious  Affect:  Flat  Thought Process:  Circumstantial  Orientation:  Full (Time, Place, and Person)  Thought Content:  Hallucinations: Auditory  Suicidal Thoughts:  Yes.  without intent/plan  Homicidal Thoughts:  No  Memory:  Immediate;   Good Recent;   Good Remote;   Good  Judgement:  Fair  Insight:  Fair  Psychomotor Activity:  Normal  Concentration:  Good  Recall:  Good  Fund of Knowledge:Good  Language: Good  Akathisia:  Negative  Handed:  Right  AIMS (if indicated):     Assets:  Communication Skills Desire for Improvement Financial  Resources/Insurance Housing Physical Health Social Support  ADL's:  Intact  Cognition: WNL  Sleep:      Medical Decision Making: Review of Psycho-Social Stressors (1), Established Problem, Worsening (2), Review of Last Therapy Session (1), Review or order medicine tests (1) and Review of Medication Regimen & Side Effects (2)  Treatment Plan Summary: Plan Patient is reporting  worsening auditory hallucinations as well as thoughts of ingesting foreign bodies. She is anxious and appears to be at higher risk of injury to herself. Patient warrants inpatient hospitalization for restabilization.Plan discontinue usual outpatient psychiatric medicines as best I can can based on her previous results.Medically she has hypothyroidism and will continue on Synthroid. Also has a history of some high blood pressure and will continue on low dose of lisinopril.PatientHas an x-ray today that shows that the 2 previous safety pins are still present. She will continue her MiraLAX and I'm going to also request a surgery consult to follow-up with that. Continue involuntary commitment. Patient is agreeable to the plan  Plan:  Recommend psychiatric Inpatient admission when medically cleared. Disposition: Admit to psychiatry C full notable  Aldean Suddeth, [facility] 09/29/2014 3:59 PM

## 2014-09-29 NOTE — ED Notes (Signed)
Patient states is at [facility] group home, having thoughts of harming self since yesterday, had plan to swallow razor blades, states has not done anything to hurt self at this point.

## 2014-09-29 NOTE — ED Notes (Signed)
BEHAVIORAL HEALTH ROUNDING Patient sleeping: No. Patient alert and oriented: yes Behavior appropriate: Yes.  ; If no, describe:  Nutrition and fluids offered: Yes  Toileting and hygiene offered: Yes  Sitter present: no Law enforcement present: Yes, ODS 

## 2014-09-29 NOTE — ED Notes (Signed)
Pt report received from Melanie Bell. Pt transferred to ED BHU accompanied by this RN and ODS officer without incident. Pt care assumed. Pt oriented to unit and advised that cameras are present in every room of unit for pt safety. Pt verbalizes understanding. Pt has no needs or concerns at this time.

## 2014-09-29 NOTE — ED Notes (Signed)
Pt resting in bed with eyes closed. No unusual behavior observed. Pt has no needs or concerns at this time. Will continue to monitor and f/u as needed.  

## 2014-09-30 ENCOUNTER — Inpatient Hospital Stay
Admission: EM | Admit: 2014-09-30 | Discharge: 2014-10-06 | DRG: 885 | Payer: MEDICAID | Source: Intra-hospital | Attending: Psychiatry | Admitting: Psychiatry

## 2014-09-30 DIAGNOSIS — F209 Schizophrenia, unspecified: Secondary | ICD-10-CM | POA: Diagnosis present

## 2014-09-30 DIAGNOSIS — Z87821 Personal history of retained foreign body fully removed: Secondary | ICD-10-CM

## 2014-09-30 DIAGNOSIS — F419 Anxiety disorder, unspecified: Secondary | ICD-10-CM | POA: Diagnosis present

## 2014-09-30 DIAGNOSIS — Z888 Allergy status to other drugs, medicaments and biological substances status: Secondary | ICD-10-CM

## 2014-09-30 DIAGNOSIS — G2401 Drug induced subacute dyskinesia: Secondary | ICD-10-CM | POA: Diagnosis present

## 2014-09-30 DIAGNOSIS — R197 Diarrhea, unspecified: Secondary | ICD-10-CM | POA: Diagnosis present

## 2014-09-30 DIAGNOSIS — I10 Essential (primary) hypertension: Secondary | ICD-10-CM | POA: Diagnosis present

## 2014-09-30 DIAGNOSIS — R443 Hallucinations, unspecified: Secondary | ICD-10-CM

## 2014-09-30 DIAGNOSIS — G4700 Insomnia, unspecified: Secondary | ICD-10-CM | POA: Diagnosis present

## 2014-09-30 DIAGNOSIS — F17200 Nicotine dependence, unspecified, uncomplicated: Secondary | ICD-10-CM | POA: Diagnosis present

## 2014-09-30 DIAGNOSIS — T189XXA Foreign body of alimentary tract, part unspecified, initial encounter: Secondary | ICD-10-CM

## 2014-09-30 DIAGNOSIS — Z818 Family history of other mental and behavioral disorders: Secondary | ICD-10-CM | POA: Diagnosis not present

## 2014-09-30 DIAGNOSIS — K5900 Constipation, unspecified: Secondary | ICD-10-CM | POA: Diagnosis present

## 2014-09-30 DIAGNOSIS — F251 Schizoaffective disorder, depressive type: Secondary | ICD-10-CM | POA: Diagnosis present

## 2014-09-30 DIAGNOSIS — Z8249 Family history of ischemic heart disease and other diseases of the circulatory system: Secondary | ICD-10-CM

## 2014-09-30 DIAGNOSIS — Z79899 Other long term (current) drug therapy: Secondary | ICD-10-CM

## 2014-09-30 DIAGNOSIS — Z91410 Personal history of adult physical and sexual abuse: Secondary | ICD-10-CM

## 2014-09-30 DIAGNOSIS — E039 Hypothyroidism, unspecified: Secondary | ICD-10-CM | POA: Diagnosis present

## 2014-09-30 HISTORY — DX: Hallucinations, unspecified: R44.3

## 2014-09-30 HISTORY — DX: Essential (primary) hypertension: I10

## 2014-09-30 MED ORDER — LITHIUM CARBONATE ER 300 MG PO TBCR
300.0000 mg | EXTENDED_RELEASE_TABLET | Freq: Every morning | ORAL | Status: DC
  Administered 2014-09-30 – 2014-10-06 (×7): 300 mg via ORAL
  Filled 2014-09-30 (×8): qty 1

## 2014-09-30 MED ORDER — LITHIUM CARBONATE ER 300 MG PO TBCR
600.0000 mg | EXTENDED_RELEASE_TABLET | Freq: Every day | ORAL | Status: DC
  Administered 2014-09-30 – 2014-10-05 (×6): 600 mg via ORAL
  Filled 2014-09-30 (×8): qty 2

## 2014-09-30 MED ORDER — POLYETHYLENE GLYCOL 3350 17 G PO PACK
17.0000 g | PACK | Freq: Every day | ORAL | Status: DC
  Administered 2014-09-30 – 2014-10-01 (×2): 17 g via ORAL
  Filled 2014-09-30 (×2): qty 1

## 2014-09-30 MED ORDER — NICOTINE 14 MG/24HR TD PT24
14.0000 mg | MEDICATED_PATCH | Freq: Every day | TRANSDERMAL | Status: DC
  Administered 2014-10-01 – 2014-10-06 (×6): 14 mg via TRANSDERMAL
  Filled 2014-09-30 (×7): qty 1

## 2014-09-30 MED ORDER — SENNA 8.6 MG PO TABS
2.0000 | ORAL_TABLET | Freq: Every day | ORAL | Status: DC
  Administered 2014-09-30 – 2014-10-05 (×6): 17.2 mg via ORAL
  Filled 2014-09-30 (×6): qty 2

## 2014-09-30 MED ORDER — FLUPHENAZINE HCL 5 MG PO TABS
5.0000 mg | ORAL_TABLET | Freq: Every day | ORAL | Status: DC
  Administered 2014-09-30 – 2014-10-01 (×2): 5 mg via ORAL
  Filled 2014-09-30 (×2): qty 1

## 2014-09-30 MED ORDER — LURASIDONE HCL 40 MG PO TABS
80.0000 mg | ORAL_TABLET | Freq: Every day | ORAL | Status: DC
  Administered 2014-09-30 – 2014-10-05 (×6): 80 mg via ORAL
  Filled 2014-09-30 (×6): qty 2

## 2014-09-30 MED ORDER — LEVOTHYROXINE SODIUM 75 MCG PO TABS
75.0000 ug | ORAL_TABLET | Freq: Every day | ORAL | Status: DC
  Administered 2014-10-01 – 2014-10-06 (×6): 75 ug via ORAL
  Filled 2014-09-30 (×7): qty 1

## 2014-09-30 MED ORDER — BENZTROPINE MESYLATE 1 MG PO TABS
1.0000 mg | ORAL_TABLET | Freq: Every day | ORAL | Status: DC
  Administered 2014-09-30 – 2014-10-05 (×6): 1 mg via ORAL
  Filled 2014-09-30 (×6): qty 1

## 2014-09-30 MED ORDER — OXCARBAZEPINE 150 MG PO TABS
150.0000 mg | ORAL_TABLET | Freq: Two times a day (BID) | ORAL | Status: DC
  Administered 2014-09-30 – 2014-10-06 (×13): 150 mg via ORAL
  Filled 2014-09-30 (×18): qty 1

## 2014-09-30 MED ORDER — VENLAFAXINE HCL ER 75 MG PO CP24
150.0000 mg | ORAL_CAPSULE | Freq: Every day | ORAL | Status: DC
  Administered 2014-09-30 – 2014-10-06 (×7): 150 mg via ORAL
  Filled 2014-09-30 (×7): qty 2

## 2014-09-30 MED ORDER — ALUM & MAG HYDROXIDE-SIMETH 200-200-20 MG/5ML PO SUSP: 30.0000 mL | ORAL | Status: DC | PRN

## 2014-09-30 MED ORDER — FLUPHENAZINE HCL 5 MG PO TABS
10.0000 mg | ORAL_TABLET | Freq: Every day | ORAL | Status: DC
Start: 2014-09-30 — End: 2014-10-06
  Administered 2014-09-30 – 2014-10-05 (×6): 10 mg via ORAL
  Filled 2014-09-30 (×6): qty 2

## 2014-09-30 MED ORDER — MAGNESIUM HYDROXIDE 400 MG/5ML PO SUSP: 30.0000 mL | Freq: Every day | ORAL | Status: DC | PRN

## 2014-09-30 MED ORDER — ACETAMINOPHEN 325 MG PO TABS
650.0000 mg | ORAL_TABLET | Freq: Four times a day (QID) | ORAL | Status: DC | PRN
  Administered 2014-10-01 – 2014-10-03 (×2): 650 mg via ORAL
  Filled 2014-09-30 (×2): qty 2

## 2014-09-30 MED ORDER — TRAZODONE HCL 50 MG PO TABS: 50.0000 mg | ORAL_TABLET | Freq: Every evening | ORAL | Status: DC | PRN

## 2014-09-30 MED ORDER — LORAZEPAM 2 MG PO TABS: 2.0000 mg | ORAL_TABLET | Freq: Four times a day (QID) | ORAL | Status: DC | PRN

## 2014-09-30 MED ORDER — DOCUSATE SODIUM 100 MG PO CAPS
200.0000 mg | ORAL_CAPSULE | Freq: Two times a day (BID) | ORAL | Status: DC
  Administered 2014-09-30 – 2014-10-06 (×13): 200 mg via ORAL
  Filled 2014-09-30 (×14): qty 2
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
## 2014-09-30 NOTE — BHH Group Notes (Signed)
[facility] LCSW Group Therapy  09/30/2014 3:24 PM  Type of Therapy:  Group Therapy  Participation Level:  Active  Participation Quality:  Attentive  Affect:  Appropriate  Cognitive:  Appropriate  Insight:  Developing/Improving  Engagement in Therapy:  Developing/Improving  Modes of Intervention:  Discussion, Exploration, Problem-solving and Socialization  Summary of Progress/Problems: :LCSW introduced group rules to patients. LCSW introduced the topic " Balance in life" Patients were asked to disclose in a collective group "what their life looks like when its unbalanced. Ideas that were shared, decrease in sleep, increase in sleep, hearing and seeing things not real, anxiety, social isolation,couch potatoes, no motivation,negative attitude.Pt asked what they experience when they are balanced. Ideas shared were, light hearted, organized, medications working, energetic, positive attitude. Patient were asked as a group with plans to assist them in keeping their balance. Ideas shared, eating and sleeping well, keep all medical appointments, take medications, reduce stress, increase physical and recreational activities.  Patient was clear in her thoughts and answered approprietly when asked to contribute. She does not like when people come into her personal space so she maintains good distance from people which helps her stay balanced.   [place] 09/30/2014, 3:24 PM

## 2014-09-30 NOTE — B[facility] Group Notes (Signed)
[facility] Group Notes:  (Nursing/MHT/Case Management/Adjunct)  Date:  09/30/2014  Time:  11:59 AM  Type of Therapy:  Psychoeducational Skills  Participation Level:  Did Not Attend  Participation Quality:  n/a  Affect:  n/a  Cognitive:  n/a  Insight:  None  Engagement in Group:  n/a  Modes of Intervention:  n/a  Summary of Progress/Problems:  Adela Lank Aunisty Reali 09/30/2014, 11:59 AM

## 2014-09-30 NOTE — ED Notes (Signed)
BEHAVIORAL HEALTH ROUNDING Patient sleeping: Yes.   Patient alert and oriented: asleep Behavior appropriate: Yes.  ; If no, describe:  Nutrition and fluids offered: asleep Toileting and hygiene offered: asleep Sitter present: no Law enforcement present: Yes, ODS 

## 2014-09-30 NOTE — Progress Notes (Signed)
Patient searched on arrival to unit  With no contraband found

## 2014-09-30 NOTE — ED Notes (Signed)
Pt resting in bed with eyes closed. No unusual behavior observed. Pt has no needs or concerns at this time. Will continue to monitor and f/u as needed.  
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
## 2014-09-30 NOTE — Progress Notes (Signed)
Recreation Therapy Notes  INPATIENT RECREATION THERAPY ASSESSMENT  Patient Details [name] MRN: [id number] DOB: 1991/05/10 Today's Date: 09/30/2014  Patient Stressors: Family, Other (Comment) (Misses mom, found out staff at group home has breat cancer and is not doing well)  Coping Skills:   Exercise, Art/Dance, Talking, Music, Other (Comment) (Journalism, reading, writing poetry)  Personal Challenges: Communication, Concentration, Decision-Making, Expressing Yourself, Relationships, Stress Management, Time Management, Trusting Others  Leisure Interests (2+):  Sports - Other (Comment), Individual - Other (Comment) (Volleyball, arts and crafts)  Awareness of Community Resources:  Yes  Community Resources:  [place]  Current Use: Yes  If no, Barriers?:    Patient Strengths:  Beautiful, helpful  Patient Identified Areas of Improvement:  No  Current Recreation Participation:  Reading  Patient Goal for Hospitalization:  Get rid of the voices and get back to group home  [place] of Residence:  [place] of Residence:  [place]   Current SI (including self-harm):  No  Current HI:  No  Consent to Intern Participation: N/A   Leonette Monarch, LRT/CTRS 09/30/2014, 2:47 PM

## 2014-09-30 NOTE — Plan of Care (Signed)
Problem: Ineffective individual coping Goal: STG: Patient will remain free from self harm Outcome: Progressing Able to contract with Probation officer

## 2014-09-30 NOTE — BHH Suicide Risk Assessment (Signed)
[facility] Admission Suicide Risk Assessment   Nursing information obtained from:  Patient Demographic factors:  NA Current Mental Status:  NA Loss Factors:  NA Historical Factors:  Prior suicide attempts, Impulsivity Risk Reduction Factors:  Positive social support, Positive coping skills or problem solving skills Total Time spent with patient: 1 hour Principal Problem: <principal problem not specified> Diagnosis:   Patient Active Problem List   Diagnosis Date Noted  . Schizoaffective disorder [F25.9] 09/30/2014  . H/O foreign body ingestion [Z87.821] 09/29/2014  . Hypothyroid [E03.9] 09/29/2014  . Hypertension [I10] 09/29/2014  . Constipation [K59.00] 09/29/2014     Continued Clinical Symptoms:  Alcohol Use Disorder Identification Test Final Score (AUDIT): 4 The "Alcohol Use Disorders Identification Test", Guidelines for Use in Primary Care, Second Edition.  World Pharmacologist [facility]). Score between 0-7:  no or low risk or alcohol related problems. Score between 8-15:  moderate risk of alcohol related problems. Score between 16-19:  high risk of alcohol related problems. Score 20 or above:  warrants further diagnostic evaluation for alcohol dependence and treatment.   CLINICAL FACTORS:   Depression:   Impulsivity Schizophrenia:   Command hallucinatons Currently Psychotic Previous Psychiatric Diagnoses and Treatments Medical Diagnoses and Treatments/Surgeries   Musculoskeletal: Strength & Muscle Tone: within normal limits Gait & Station: normal Patient leans: N/A  Psychiatric Specialty Exam: Physical Exam  ROS  Blood pressure 121/80, pulse 72, temperature 98.5 F (36.9 C), temperature source Oral, resp. rate 18, height 5' 8.5" (1.74 m), weight 95.936 kg (211 lb 8 oz).Body mass index is 31.69 kg/(m^2).                                                         COGNITIVE FEATURES THAT CONTRIBUTE TO RISK:  None    SUICIDE RISK:   Moderate:   Frequent suicidal ideation with limited intensity, and duration, some specificity in terms of plans, no associated intent, good self-control, limited dysphoria/symptomatology, some risk factors present, and identifiable protective factors, including available and accessible social support.  PLAN OF CARE: Admit to behavioral health for further stabilization  Medical Decision Making:  Established Problem, Worsening (2)  I certify that inpatient services furnished can reasonably be expected to improve the patient's condition.   [name] 09/30/2014, 1:24 PM

## 2014-09-30 NOTE — Progress Notes (Signed)
Recreation Therapy Notes  Date: 05.17.16 Time: 3:00 pm Location: Craft Room  Group Topic: Self-esteem   Goal Area(s) Addresses:  Patient will write at least one positive trait about self. Patient will verbalize ways to increase self-esteem  Behavioral Response: Attentive, Interactive  Intervention: I Am  Activity: Patients were given a worksheet with the big letter I on it and were instructed to fill it with as many positive traits about themselves as they could.   Education: LRT educated patient on self-esteem and ways to increase self-esteem.  Education Outcome: Acknowledges education/In group clarification offered  Clinical Observations/Feedback: Patient completed activity. Patient contributed to group discussion by stating that it was easy for her to list positive traits about herself and why, how she felt after she listed positive things about herself, and how her self-esteem affects her.   Leonette Monarch, LRT/CTRS 09/30/2014 3:50 PM

## 2014-09-30 NOTE — Progress Notes (Signed)
Patient ID: Melanie Bell, female   DOB: Oct 13, 1990, 24 y.o.   MRN: [id number] 24 year old female in under the care of Dr. Jerilee Hoh. Patient came from a group home. patient stated she came to prevent herself from hurting herself.  Patient voice of auditory hallucinations that are telling her to harm herself. Patient has a history of  swallowing  Objects. Patient Involuntary Committment. Patient has slept most of am shift .

## 2014-09-30 NOTE — BHH Group Notes (Signed)
[place] Group Notes:  (Nursing/MHT/Case Management/Adjunct)  Date:  09/30/2014  Time:  11:02 PM  Type of Therapy:  Group Therapy  Participation Level:  Did Not Attend  Participation Quality:  N/A  Affect:  N/A  Cognitive:  N/A  Insight:  None  Engagement in Group:  N/A  Modes of Intervention:  N/A  Summary of Progress/Problems:  Melanie Bell 09/30/2014, 11:02 PM

## 2014-09-30 NOTE — Tx Team (Signed)
Initial Interdisciplinary Treatment Plan   PATIENT STRESSORS: Educational concerns Health problems Medication change or noncompliance   PATIENT STRENGTHS: Ability for insight Communication skills General fund of knowledge Motivation for treatment/growth Supportive family/friends   PROBLEM LIST: Problem List/Patient Goals Date to be addressed Date deferred Reason deferred Estimated date of resolution  Auditory Hallucinations  09/30/14                                                      DISCHARGE CRITERIA:  Ability to meet basic life and health needs Adequate post-discharge living arrangements Improved stabilization in mood, thinking, and/or behavior Need for constant or close observation no longer present Safe-care adequate arrangements made  PRELIMINARY DISCHARGE PLAN: Outpatient therapy Return to previous living arrangement  PATIENT/FAMIILY INVOLVEMENT: This treatment plan has been presented to and reviewed with the patient, Melanie Bell, and/or family member, .  The patient and family have been given the opportunity to ask questions and make suggestions.  Braun Rocca A Wallis Vancott 09/30/2014, 4:15 PM

## 2014-09-30 NOTE — ED Notes (Signed)
BEHAVIORAL HEALTH ROUNDING Patient sleeping: Yes.   Patient alert and oriented: asleep Behavior appropriate: Yes.  ; If no, describe:  Nutrition and fluids offered: Yes  Toileting and hygiene offered: Yes  Sitter present: no Law enforcement present: Yes, ODS

## 2014-10-01 LAB — TSH: TSH: 2.579 u[IU]/mL (ref 0.350–4.500)

## 2014-10-01 LAB — LITHIUM LEVEL: LITHIUM LVL: 0.6 mmol/L (ref 0.60–1.20)

## 2014-10-01 LAB — LIPID PANEL
CHOLESTEROL: 179 mg/dL (ref 0–200)
HDL: 38 mg/dL — ABNORMAL LOW (ref >40)
LDL Cholesterol: 127 mg/dL — ABNORMAL HIGH (ref 0–99)
Total CHOL/HDL Ratio: 4.7 RATIO
Triglycerides: 70 mg/dL (ref <150)
VLDL: 14 mg/dL (ref 0–40)

## 2014-10-01 LAB — HEMOGLOBIN A1C: HEMOGLOBIN A1C: 5.2 % (ref 4.0–6.0)

## 2014-10-01 MED ORDER — FLUPHENAZINE HCL 5 MG PO TABS
5.0000 mg | ORAL_TABLET | Freq: Two times a day (BID) | ORAL | Status: DC
  Administered 2014-10-02 – 2014-10-06 (×9): 5 mg via ORAL
  Filled 2014-10-01 (×10): qty 1

## 2014-10-01 MED ORDER — POLYETHYLENE GLYCOL 3350 17 G PO PACK: 17.0000 g | PACK | Freq: Every day | ORAL | Status: DC | PRN

## 2014-10-01 NOTE — Progress Notes (Signed)
Recreation Therapy Notes  Date: 05.18.16 Time: 3:00 pm Location: Craft Room  Group Topic: Wellness  Goal Area(s) Addresses:  Patient will identify at least one item per dimension of health. Patient will examine areas they are deficient in.  Behavioral Response: Attentive, Interactive  Intervention: 6 Dimensions of Health  Activity: Patients were given a worksheet with the definitions of the 6 dimensions on it and a worksheet with the 6 dimensions on it. Patients were instructed to list 1-2 things per each category that they were currently doing.  Education: LRT educated patients on the 6 dimensions of wellness.   Education Outcome: Acknowledges education/In group clarification offered  Clinical Observations/Feedback: Patient completed activity by listing at least 2 things per each category. Patient contributed to group discussion by stating which category she was giving enough attention to.   Leonette Monarch, LRT/CTRS 10/01/2014 4:19 PM

## 2014-10-01 NOTE — Plan of Care (Signed)
Problem: Alteration in thought process Goal: LTG-Patient has not harmed self or others in at least 2 days Outcome: Progressing Patient able to state she hasn't hit anyone . Positive strokes given

## 2014-10-01 NOTE — Plan of Care (Signed)
Problem: [facility] Participation in Recreation Therapeutic Interventions Goal: STG-Other Recreation Therapy Goal (Specify) STG: Stress Management - Within 5 treatment session, patient with demonstrate at least one stress management technique in each of 2 treatment sessions to increase stress management skills post d/c.  Outcome: Progressing Treatment Session 1; Completed 0 out of 2: At approximately 12:00 pm, LRT met with patient in craft room. LRT educated and provided patient with handouts on stress management techniques. Patient verbalized understanding. LRT encouraged patient to read over and practice the stress management techniques.  Leonette Monarch, LRT/CTRS 05.18.16 1:26 pm

## 2014-10-01 NOTE — BHH Group Notes (Signed)
[facility] LCSW Aftercare Discharge Planning Group Note   10/01/2014 2:20 PM  Participation Quality:  Good, patient reported her goal for today was to stay out of beds and attend groups  Mood/Affect:  Appropriate  Depression Rating:  Not asked  Anxiety Rating:  Not asked  Thoughts of Suicide:  No Will you contract for safety?   Yes  Current AVH:  Yes, she reports she still continues to hear voices but not as much  Plan for Discharge/Comments:  [facility] [address] Nathanial Rancher  Transportation Means: Group Home will pick her up  Supports:  [place], Matalynn Graff M

## 2014-10-01 NOTE — Progress Notes (Signed)
[facility] MD Progress Note  10/01/2014 2:47 PM Melanie Bell  MRN:  [id number] Subjective:  Patient was seen today in the day room. She has been interacting well with peers and has been attending groups. A shunt has been compliant with medications. She slept well last night and has had good oral intake.. Patient reports feeling about the same, and she continues to have auditory hallucinations that continue to command her to swallow things. She however denies wanting to hurt herself or hurt anybody else. She denies having visual hallucinations. She denies other major problems with sleep appetite energy or concentration. Patient denies having any side effect effects from her medications. As far as physical complaints he reports having some diarrhea.  States she has had at least 7 bowel movements today. Unsure whether she has passed the safety pins yet are not. Denies any abdominal pain.  Per nursing: Patient denies SI/HI. Patient does report hearing voices telling her to swallow things, however she is refusing to listen to them. Patient did NOT attend evening group. Patient isolative and remained in room throughout the night. No distress noted. A: Support and encouragement offered. Scheduled medications given to pt. Q 15 min checks continued for patient safety. R: Patient receptive. Patient remains safe on the unit.   Principal Problem: Schizoaffective disorder Diagnosis:   Patient Active Problem List   Diagnosis Date Noted  . Schizoaffective disorder [F25.9] 09/30/2014  . Tobacco use disorder [Z72.0] 09/30/2014  . H/O foreign body ingestion [Z87.821] 09/29/2014  . Hypothyroid [E03.9] 09/29/2014  . Hypertension [I10] 09/29/2014  . Constipation [K59.00] 09/29/2014   Total Time spent with patient: 30 minutes   Past Medical History:  Past Medical History  Diagnosis Date  . Hallucinations 09/30/2014    Sizoaffective  . Depression   . Anxiety   . Hypertension     Past Surgical History   Procedure Laterality Date  . Breast lumpectomy    . Wisdom tooth extraction     Family History:  Family History  Problem Relation Age of Onset  . Depression Mother   . Hypertension Mother    Social History:  History  Alcohol Use No     History  Drug Use No    History   Social History  . Marital Status: Single    Spouse Name: N/A  . Number of Children: N/A  . Years of Education: N/A   Social History Main Topics  . Smoking status: Current Every Day Smoker  . Smokeless tobacco: Not on file  . Alcohol Use: No  . Drug Use: No  . Sexual Activity: Not on file   Other Topics Concern  . None   Social History Narrative   Additional History:    Sleep: Good  Appetite:  Good   Assessment:   Musculoskeletal: Strength & Muscle Tone: within normal limits Gait & Station: normal Patient leans: N/A   Psychiatric Specialty Exam: Physical Exam  Review of Systems  Respiratory: Negative for cough.   Cardiovascular: Negative for chest pain.  Gastrointestinal: Positive for diarrhea. Negative for heartburn, nausea, vomiting, abdominal pain, constipation, blood in stool and melena.  Neurological: Negative for headaches.  Psychiatric/Behavioral: Positive for depression and hallucinations. Negative for suicidal ideas. The patient is not nervous/anxious and does not have insomnia.     Blood pressure 117/69, pulse 74, temperature 98.4 F (36.9 C), temperature source Oral, resp. rate 18, height 5' 8.5" (1.74 m), weight 95.936 kg (211 lb 8 oz).Body mass index is 31.69 kg/(m^2).  General  Appearance: Fairly Groomed  Engineer, water::  Good  Speech:  Normal Rate  Volume:  Normal  Mood:  Depressed  Affect:  Congruent  Thought Process:  concrete  Orientation:  Full (Time, Place, and Person)  Thought Content:  Hallucinations: Command:  telling to swallow objects  Suicidal Thoughts:  No  Homicidal Thoughts:  No  Memory:  Immediate;   Good Recent;   Good Remote;   Good  Judgement:   Poor  Insight:  Lacking  Psychomotor Activity:  Decreased  Concentration:  Good  Recall:  NA  Fund of Knowledge:Fair  Language: Good  Akathisia:  No  Handed:    AIMS (if indicated):     Assets:  Museum/gallery curator Social Support Transportation  ADL's:  Intact  Cognition: WNL  Sleep:  Number of Hours: 7.75     Current Medications: Current Facility-Administered Medications  Medication Dose Route Frequency Provider Last Rate Last Dose  . acetaminophen (TYLENOL) tablet 650 mg  650 mg Oral Q6H PRN [name], MD   650 mg at 10/01/14 1300  . alum & mag hydroxide-simeth (MAALOX/MYLANTA) 200-200-20 MG/5ML suspension 30 mL  30 mL Oral Q4H PRN [name], MD      . benztropine (COGENTIN) tablet 1 mg  1 mg Oral QHS [name], MD   1 mg at 09/30/14 2152  . docusate sodium (COLACE) capsule 200 mg  200 mg Oral BID [name], MD   200 mg at 10/01/14 1001  . fluPHENAZine (PROLIXIN) tablet 10 mg  10 mg Oral QHS [name], MD   10 mg at 09/30/14 2152  . fluPHENAZine (PROLIXIN) tablet 5 mg  5 mg Oral BID [name], MD      . levothyroxine (SYNTHROID, LEVOTHROID) tablet 75 mcg  75 mcg Oral QAC breakfast [name], MD   75 mcg at 10/01/14 0809  . lithium carbonate (LITHOBID) CR tablet 300 mg  300 mg Oral q morning - 10a [name], MD   300 mg at 10/01/14 1002  . lithium carbonate (LITHOBID) CR tablet 600 mg  600 mg Oral QHS [name], MD   600 mg at 09/30/14 2151  . LORazepam (ATIVAN) tablet 2 mg  2 mg Oral Q6H PRN [name], MD      . lurasidone (LATUDA) tablet 80 mg  80 mg Oral Q supper [name], MD   80 mg at 09/30/14 1659  . nicotine (NICODERM CQ - dosed in mg/24 hours) patch 14 mg  14 mg Transdermal Q0600 [name], MD   14 mg at 10/01/14 0806  . OXcarbazepine  (TRILEPTAL) tablet 150 mg  150 mg Oral BID [name], MD   150 mg at 10/01/14 1011  . polyethylene glycol (MIRALAX / GLYCOLAX) packet 17 g  17 g Oral Daily PRN [name], MD      . senna (SENOKOT) tablet 17.2 mg  2 tablet Oral QHS [name], MD   17.2 mg at 09/30/14 2152  . traZODone (DESYREL) tablet 50 mg  50 mg Oral QHS PRN [name], MD      . venlafaxine XR (EFFEXOR-XR) 24 hr capsule 150 mg  150 mg Oral Q breakfast [name], MD   150 mg at 10/01/14 6314    Lab Results:  Results for orders placed or performed during the hospital encounter of 09/30/14 (from the past 48 hour(s))  Lipid panel     Status: Abnormal   Collection Time: 10/01/14  6:18 AM  Result Value Ref  Range   Cholesterol 179 0 - 200 mg/dL   Triglycerides 70 <150 mg/dL   HDL 38 (L) >40 mg/dL   Total CHOL/HDL Ratio 4.7 RATIO   VLDL 14 0 - 40 mg/dL   LDL Cholesterol 127 (H) 0 - 99 mg/dL    Comment:        Total Cholesterol/HDL:CHD Risk Coronary Heart Disease Risk Table                     Men   Women  1/2 Average Risk   3.4   3.3  Average Risk       5.0   4.4  2 X Average Risk   9.6   7.1  3 X Average Risk  23.4   11.0        Use the calculated Patient Ratio above and the CHD Risk Table to determine the patient's CHD Risk.        ATP III CLASSIFICATION (LDL):  <100     mg/dL   Optimal  100-129  mg/dL   Near or Above                    Optimal  130-159  mg/dL   Borderline  160-189  mg/dL   High  >190     mg/dL   Very High   TSH     Status: None   Collection Time: 10/01/14  6:18 AM  Result Value Ref Range   TSH 2.579 0.350 - 4.500 uIU/mL  Lithium level     Status: None   Collection Time: 10/01/14 10:00 AM  Result Value Ref Range   Lithium Lvl 0.60 0.60 - 1.20 mmol/L    Physical Findings: AIMS: Facial and Oral Movements Muscles of Facial Expression: None, normal Lips and Perioral Area: None, normal Jaw: None, normal Tongue:  None, normal,Extremity Movements Upper (arms, wrists, hands, fingers): None, normal Lower (legs, knees, ankles, toes): None, normal, Trunk Movements Neck, shoulders, hips: None, normal, Overall Severity Severity of abnormal movements (highest score from questions above): None, normal Incapacitation due to abnormal movements: None, normal Patient's awareness of abnormal movements (rate only patient's report): No Awareness, Dental Status Current problems with teeth and/or dentures?: No Does patient usually wear dentures?: No  CIWA:  CIWA-Ar Total: 0 COWS:  COWS Total Score: 0  Treatment Plan Summary: Daily contact with patient to assess and evaluate symptoms and progress in treatment and Medication management -Schizoaffective disorder: I have contacted the patient's group home manager in review the list of medications prior to admission which has been printed and placed it in the patient's paper chart. All medications listed above where the ones given at the group home. The medication that the patient is states was recently discontinued appears to be Prazosin 2 mg daily at bedtime  Psychosis:Patient will be continued on fluphenazine.  I will increase the dose to 5 mg in the morning, 5 mg at 4 PM and 10 mg by mouth daily at bedtime. She will also will be continued on Latuda 80 mg at bedtime.  Mood: Patient will be continued on lithium CR 300 mg by mouth every morning and 600 mg by mouth daily at bedtime. Patient will also be continued on Trileptal 150 mg by mouth twice a day.  Lithium level on 5/18 0.6 .Continue venlafaxine ex are 150 mg by mouth every morning  -EPS: Continue benztropine 1 mg by mouth daily at bedtime  -Agitation: We'll order Ativan 2 mg every  6 hours when necessary  -Insomnia: Will continue the patient on trazodone 50 mg by mouth daily at bedtime when necessary  -Chronic constipation: Continue Colace but I will increase the dose to 200 mg by mouth twice a day. Continue  Senokot but I will increase the dose to 2 tablets by mouth daily at bedtime. Continue MiraLAX daily but we'll change to when necessary as patient is having diarrhea  -Hypothyroidism: Patient will be continued on Synthroid 75 g daily  -Hypertension I will not continue the patient on lisinopril as these medication increases the risk for lithium toxicity. Blood pressure and heart rate continued to be within the normal limits doesn't seem to be a need to add an antihypertensive.  -Tobacco use disorder: start the patient on nicotine patch 14 mg  -Foreign body ingestion: abdominal xray from 5/16: Two safety pins are again identified. One of these has progressed and now projects over the distal descending colon. The second projects in the right lower quadrant of the abdomen and may be in the distal ileum. No new foreign body is identified. There is a massive volume of stool in the transverse and descending colon. No evidence of small bowel obstruction is seen.  -Collateral information: Group home manager has been contacted she continues to report concerns about the patient's safety as she has a history of swallowing objects.  They don't think the patient is states at this time.  Will contact guardian today [phone].  I will discuss the possibility of starting the patient on Clozaril as she is failing to respond to the combination of Prolixin and Latuda.  -Discharge planning: Once a stable patient will be discharged back to her group home  Labs: TSH wnl, lipid panel slightly increased LDL, Lithium level 0.6.  Hemoglobin A1c is pending.  Medical Decision Making:  Established Problem, Worsening (2)     [name] 10/01/2014, 2:47 PM

## 2014-10-01 NOTE — Progress Notes (Signed)
D: Patient denies SI/HI.  Patient does report hearing voices telling her to swallow things, however she is refusing to listen to them.   Patient did NOT attend evening group. Patient isolative and remained in room throughout the night.  No distress noted. A: Support and encouragement offered. Scheduled medications given to pt. Q 15 min checks continued for patient safety. R: Patient receptive. Patient remains safe on the unit.

## 2014-10-01 NOTE — [facility] Group Notes (Signed)
[facility] Group Notes:  (Nursing/MHT/Case Management/Adjunct)  Date:  10/01/2014  Time:  12:11 PM  Type of Therapy:  Psychoeducational Skills  Participation Level:  Active  Participation Quality:  Appropriate, Attentive and Sharing  Affect:  Appropriate  Cognitive:  Alert and Appropriate  Insight:  Appropriate  Engagement in Group:  Engaged  Modes of Intervention:  Discussion and Education  Summary of Progress/Problems:  Adela Lank [facility] 10/01/2014, 12:11 PM

## 2014-10-01 NOTE — Progress Notes (Addendum)
Patient stated she slept well last night. Voice of appetite good and energy level low. Depression 1 Hopelessness 2, and anxiety4. (low 0-10 high). Patient denies suicidal ideations. Patient denies auditory  hallucinations .  Attending unit programming. Limited interaction with his peers. Voice of goal today " Talk about feeling before they overwhelmed me "and to meet that goal " Stay up and out of room and interact"

## 2014-10-02 NOTE — Progress Notes (Signed)
D: Patient denies SI/HI.  Patient does endorse auditory hallucinations telling her to swallow objects.  Patient affect is flat and her mood is depressed.   Patient did NOT attend evening group. Patient isolative in her room throughout the shift.  No distress noted. A: Support and encouragement offered. Scheduled medications given to pt. Q 15 min checks continued for patient safety. R: Patient receptive. Patient remains safe on the unit.

## 2014-10-02 NOTE — Progress Notes (Addendum)
[facility] MD Progress Note  10/02/2014 9:21 AM Melanie Bell  MRN:  [id number] Subjective:  Melanie Bell was seen today in the day room. She has been interacting well with peers and has been attending groups. She has been compliant with medications. She slept well last night and has had good oral intake.. Melanie Bell reports feeling better this morning.  She continues to have auditory hallucinations but now she cannot understand what they are saying.  She grades how bothersome the hallucinations are as a 4 out of 10 by 10 being the worst.  She  denies wanting to hurt herself or hurt anybody else. She denies having visual hallucinations. She denies other major problems with sleep appetite energy or concentration. Melanie Bell denies having any side effect effects from her medications. As far as physical complaints she denies having any physical complaints today. Melanie Bell reports that she was tried in the past on Clozaril. At that time she was a Melanie Bell of [facility].  Melanie Bell remembers that it was discontinued because she had a drop in her white blood cells.  She remembers doing really well on Clozaril.  Per nursing: Melanie Bell denies SI/HI. Melanie Bell does endorse auditory hallucinations telling her to swallow objects. Melanie Bell affect is flat and her mood is depressed. Melanie Bell did NOT attend evening group. Melanie Bell isolative in her room throughout the shift. No distress noted. A: Support and encouragement offered. Scheduled medications given to pt. Q 15 min checks continued for Melanie Bell safety. R: Melanie Bell receptive. Melanie Bell remains safe on the unit.   Principal Problem: Schizoaffective disorder Diagnosis:   Melanie Bell Active Problem List   Diagnosis Date Noted  . Schizoaffective disorder [F25.9] 09/30/2014  . Tobacco use disorder [Z72.0] 09/30/2014  . H/O foreign body ingestion [Z87.821] 09/29/2014  . Hypothyroid [E03.9] 09/29/2014  . Hypertension [I10] 09/29/2014  . Constipation [K59.00] 09/29/2014   Total Time spent  with Melanie Bell: 30 minutes   Past Medical History:  Past Medical History  Diagnosis Date  . Hallucinations 09/30/2014    Sizoaffective  . Depression   . Anxiety   . Hypertension     Past Surgical History  Procedure Laterality Date  . Breast lumpectomy    . Wisdom tooth extraction     Family History:  Family History  Problem Relation Age of Onset  . Depression Mother   . Hypertension Mother    Social History:  History  Alcohol Use No     History  Drug Use No    History   Social History  . Marital Status: Single    Spouse Name: N/A  . Number of Children: N/A  . Years of Education: N/A   Social History Main Topics  . Smoking status: Current Every Day Smoker  . Smokeless tobacco: Not on file  . Alcohol Use: No  . Drug Use: No  . Sexual Activity: Not on file   Other Topics Concern  . None   Social History Narrative   Additional History:    Sleep: Good  Appetite:  Good   Assessment:   Musculoskeletal: Strength & Muscle Tone: within normal limits Gait & Station: normal Melanie Bell leans: N/A   Psychiatric Specialty Exam: Physical Exam   Review of Systems  HENT: Negative.   Respiratory: Negative.   Gastrointestinal: Negative.   Musculoskeletal: Negative.   Psychiatric/Behavioral: Positive for hallucinations.    Blood pressure 111/70, pulse 71, temperature 98.9 F (37.2 C), temperature source Oral, resp. rate 18, height 5' 8.5" (1.74 m), weight 95.936 kg (211 lb 8  oz).Body mass index is 31.69 kg/(m^2).  General Appearance: Fairly Groomed  Engineer, water::  Good  Speech:  Normal Rate  Volume:  Normal  Mood:  Depressed  Affect:  Congruent  Thought Process:  concrete  Orientation:  Full (Time, Place, and Person)  Thought Content:  Hallucinations: Command:  telling to swallow objects  Suicidal Thoughts:  No  Homicidal Thoughts:  No  Memory:  Immediate;   Good Recent;   Good Remote;   Good  Judgement:  Poor  Insight:  Lacking  Psychomotor Activity:   Decreased  Concentration:  Good  Recall:  NA  Fund of Knowledge:Fair  Language: Good  Akathisia:  No  Handed:    AIMS (if indicated):     Assets:  Museum/gallery curator Social Support Transportation  ADL's:  Intact  Cognition: WNL  Sleep:  Number of Hours: 6.5     Current Medications: Current Facility-Administered Medications  Medication Dose Route Frequency Provider Last Rate Last Dose  . acetaminophen (TYLENOL) tablet 650 mg  650 mg Oral Q6H PRN [name], MD   650 mg at 10/01/14 1300  . alum & mag hydroxide-simeth (MAALOX/MYLANTA) 200-200-20 MG/5ML suspension 30 mL  30 mL Oral Q4H PRN [name], MD      . benztropine (COGENTIN) tablet 1 mg  1 mg Oral QHS [name], MD   1 mg at 10/01/14 2144  . docusate sodium (COLACE) capsule 200 mg  200 mg Oral BID [name], MD   200 mg at 10/01/14 2142  . fluPHENAZine (PROLIXIN) tablet 10 mg  10 mg Oral QHS [name], MD   10 mg at 10/01/14 2143  . fluPHENAZine (PROLIXIN) tablet 5 mg  5 mg Oral BID [name], MD   0 mg at 10/01/14 2200  . levothyroxine (SYNTHROID, LEVOTHROID) tablet 75 mcg  75 mcg Oral QAC breakfast [name], MD   75 mcg at 10/02/14 0754  . lithium carbonate (LITHOBID) CR tablet 300 mg  300 mg Oral q morning - 10a [name], MD   300 mg at 10/01/14 1002  . lithium carbonate (LITHOBID) CR tablet 600 mg  600 mg Oral QHS [name], MD   600 mg at 10/01/14 2144  . LORazepam (ATIVAN) tablet 2 mg  2 mg Oral Q6H PRN [name], MD      . lurasidone (LATUDA) tablet 80 mg  80 mg Oral Q supper [name], MD   80 mg at 10/01/14 1642  . nicotine (NICODERM CQ - dosed in mg/24 hours) patch 14 mg  14 mg Transdermal Q0600 [name], MD   14 mg at 10/02/14 0758  . OXcarbazepine (TRILEPTAL) tablet 150 mg  150 mg Oral  BID [name], MD   150 mg at 10/01/14 2145  . polyethylene glycol (MIRALAX / GLYCOLAX) packet 17 g  17 g Oral Daily PRN [name], MD      . senna (SENOKOT) tablet 17.2 mg  2 tablet Oral QHS [name], MD   17.2 mg at 10/01/14 2142  . traZODone (DESYREL) tablet 50 mg  50 mg Oral QHS PRN [name], MD      . venlafaxine XR (EFFEXOR-XR) 24 hr capsule 150 mg  150 mg Oral Q breakfast [name], MD   150 mg at 10/02/14 8366    Lab Results:  Results for orders placed or performed during the hospital encounter of 09/30/14 (from the past 48 hour(s))  Lipid panel     Status: Abnormal  Collection Time: 10/01/14  6:18 AM  Result Value Ref Range   Cholesterol 179 0 - 200 mg/dL   Triglycerides 70 <150 mg/dL   HDL 38 (L) >40 mg/dL   Total CHOL/HDL Ratio 4.7 RATIO   VLDL 14 0 - 40 mg/dL   LDL Cholesterol 127 (H) 0 - 99 mg/dL    Comment:        Total Cholesterol/HDL:CHD Risk Coronary Heart Disease Risk Table                     Men   Women  1/2 Average Risk   3.4   3.3  Average Risk       5.0   4.4  2 X Average Risk   9.6   7.1  3 X Average Risk  23.4   11.0        Use the calculated Melanie Bell Ratio above and the CHD Risk Table to determine the Melanie Bell's CHD Risk.        ATP III CLASSIFICATION (LDL):  <100     mg/dL   Optimal  100-129  mg/dL   Near or Above                    Optimal  130-159  mg/dL   Borderline  160-189  mg/dL   High  >190     mg/dL   Very High   TSH     Status: None   Collection Time: 10/01/14  6:18 AM  Result Value Ref Range   TSH 2.579 0.350 - 4.500 uIU/mL  Hemoglobin A1c     Status: None   Collection Time: 10/01/14  6:18 AM  Result Value Ref Range   Hgb A1c MFr Bld 5.2 4.0 - 6.0 %  Lithium level     Status: None   Collection Time: 10/01/14 10:00 AM  Result Value Ref Range   Lithium Lvl 0.60 0.60 - 1.20 mmol/L    Physical Findings: AIMS: Facial and Oral Movements Muscles of  Facial Expression: None, normal Lips and Perioral Area: None, normal Jaw: None, normal Tongue: None, normal,Extremity Movements Upper (arms, wrists, hands, fingers): None, normal Lower (legs, knees, ankles, toes): None, normal, Trunk Movements Neck, shoulders, hips: None, normal, Overall Severity Severity of abnormal movements (highest score from questions above): None, normal Incapacitation due to abnormal movements: None, normal Melanie Bell's awareness of abnormal movements (rate only Melanie Bell's report): No Awareness, Dental Status Current problems with teeth and/or dentures?: No Does Melanie Bell usually wear dentures?: No  CIWA:  CIWA-Ar Total: 0 COWS:  COWS Total Score: 0  Treatment Plan Summary: Daily contact with Melanie Bell to assess and evaluate symptoms and progress in treatment and Medication management -Schizoaffective disorder: I have contacted the Melanie Bell's group home manager in review the list of medications prior to admission which has been printed and placed it in the Melanie Bell's paper chart. All medications listed above where the ones given at the group home. The medication that the Melanie Bell is states was recently discontinued appears to be Prazosin 2 mg daily at bedtime  Psychosis:Melanie Bell will be continued on fluphenazine  5 mg in the morning, 5 mg at 4 PM and 10 mg by mouth daily at bedtime. She will also will be continued on Latuda 80 mg at bedtime.  Mood: Melanie Bell will be continued on lithium CR 300 mg by mouth every morning and 600 mg by mouth daily at bedtime. Melanie Bell will also be continued on Trileptal 150 mg by mouth twice  a day.  Lithium level on 5/18 0.6 .Continue venlafaxine ex are 150 mg by mouth every morning  -EPS: Continue benztropine 1 mg by mouth daily at bedtime  -Agitation: We'll order Ativan 2 mg every 6 hours when necessary  -Insomnia: Will continue the Melanie Bell on trazodone 50 mg by mouth daily at bedtime when necessary  -Chronic constipation: Continue Colace but I  will increase the dose to 200 mg by mouth twice a day. Continue Senokot but I will increase the dose to 2 tablets by mouth daily at bedtime. Continue MiraLAX daily but we'll change to when necessary as Melanie Bell is having diarrhea  -Hypothyroidism: Melanie Bell will be continued on Synthroid 75 g daily  -Hypertension I will not continue the Melanie Bell on lisinopril as these medication increases the risk for lithium toxicity. Blood pressure and heart rate continued to be within the normal limits doesn't seem to be a need to add an antihypertensive.  -Tobacco use disorder: start the Melanie Bell on nicotine patch 14 mg  -Foreign body ingestion: abdominal xray from 5/16: Two safety pins are again identified. One of these has progressed and now projects over the distal descending colon. The second projects in the right lower quadrant of the abdomen and may be in the distal ileum. No new foreign body is identified. There is a massive volume of stool in the transverse and descending colon. No evidence of small bowel obstruction is seen.  -Collateral information: Group home manager has been contacted she continues to report concerns about the Melanie Bell's safety as she has a history of swallowing objects.  They don't think the Melanie Bell is states at this time.  Contacted guardian today [phone] but not able to leave message in her voicemail .  I'll attempt again today and I will discuss the possibility of starting the Melanie Bell on Clozaril as she is failing to respond to the combination of Prolixin and Latuda.  Melanie Bell reports neutropenia in the past with Clozaril however the guidelines have to change and the [place] can be as low as 500. It is unclear as to what was her [place] at that time  -Discharge planning: Once a stable Melanie Bell will be discharged back to her group home  Labs: TSH wnl, lipid panel slightly increased LDL, Lithium level 0.6.  Hemoglobin A1c is 5.2  Medical Decision Making:  Established Problem, Worsening  (2)     [name] 10/02/2014, 9:21 AM

## 2014-10-02 NOTE — Progress Notes (Signed)
Recreation Therapy Notes  Date: 05.19.16  Time: 3:00 pm Location: Craft Room  Group Topic: Leisure Education  Goal Area(s) Addresses:  Increase patient leisure awareness and education  Behavioral Response: Attentive, Interactive  Intervention: Leisure Interview  Activity: Patients paired up and were given a list of questions about leisure interests to ask their peer.   Education: LRT educated patients on leisure.  Education Outcome: Acknowledges education/In group clarification offered  Clinical Observations/Feedback: Patient completed activity by pairing up with peer and asking as well as answering questions. Patient contributed to group discussion by stating that sometimes it is easy to make good leisure choices, and something that she learned about herself.   Leonette Monarch, LRT/CTRS 10/02/2014 5:02 PM

## 2014-10-02 NOTE — Plan of Care (Signed)
Problem: Ineffective individual coping Goal: STG: Pt will be able to identify effective and ineffective STG: Pt will be able to identify effective and ineffective coping patterns  Outcome: Progressing Identifying different ways of coping ,what works .

## 2014-10-02 NOTE — Tx Team (Signed)
Interdisciplinary Treatment Plan Update (Adult)  Date:  10/02/2014 Time Reviewed:  10:10 AM  Progress in Treatment: Attending groups: Yes. Participating in groups:  Yes. Taking medication as prescribed:  Yes. Tolerating medication:  Yes. Family/Significant othe contact made:  No, will contact:  family if pt consents Patient understands diagnosis:  Yes. Discussing patient identified problems/goals with staff:  Yes. Medical problems stabilized or resolved:  Yes. Denies suicidal/homicidal ideation: Yes. Issues/concerns per patient self-inventory:  No. Other:  New problem(s) identified: No, Describe:  none  Discharge Plan or Barriers:  Reason for Continuation of Hospitalization: Hallucinations Medication stabilization  Comments:  Estimated length of stay: Up to 7 days  New goal(s):  Review of initial/current patient goals per problem list:   See care plan  Attendees: Patient:   5/19/201610:10 AM  Family:   5/19/201610:10 AM  Physician:  [name], MD 5/19/201610:10 AM  Nursing:    5/19/201610:10 AM  Case Manager:   5/19/201610:10 AM  Counselor:   5/19/201610:10 AM  Other:  Benjiman Core, PsyD 5/19/201610:10 AM  Other:  Carmell Austria, LCSWA 5/19/201610:10 AM  Other:  Emelia Loron, LCSW 5/19/201610:10 AM  Other: Everitt Amber, LRT 5/19/201610:10 AM  Other:  5/19/201610:10 AM  Other:  5/19/201610:10 AM  Other:  5/19/201610:10 AM  Other:  5/19/201610:10 AM  Other:  5/19/201610:10 AM  Other:   5/19/201610:10 AM   Scribe for Treatment Team:   [employer] [facility], [place], [place], 10/02/2014, 10:10 AM

## 2014-10-02 NOTE — BHH Group Notes (Signed)
[place] Group Notes:  (Nursing/MHT/Case Management/Adjunct)  Date:  10/02/2014  Time:  1:57 PM  Type of Therapy:  Group Therapy  Participation Level:  Active  Participation Quality:  Appropriate  Affect:  Appropriate  Cognitive:  Appropriate  Insight:  Good  Engagement in Group:  Engaged  Modes of Intervention:  Activity  Summary of Progress/Problems:  Melanie Bell 10/02/2014, 1:57 PM

## 2014-10-02 NOTE — BHH Group Notes (Signed)
[place] LCSW Group Therapy  10/02/2014 3:18 PM  Type of Therapy:  Group Therapy  Participation Level:  Active  Participation Quality:  Appropriate and Attentive  Affect:  Appropriate  Cognitive:  Alert, Appropriate and Oriented  Insight:  Engaged  Engagement in Therapy:  Engaged  Modes of Intervention:  Education, Socialization and Support  Summary of Progress/Problems: Pt attended and participated in group appropriately. Pt shared that she gets joy from "writing poetry".   Carmell Austria T 10/02/2014, 3:18 PM

## 2014-10-02 NOTE — BHH Group Notes (Signed)
[place] Group Notes:  (Nursing/MHT/Case Management/Adjunct)  Date:  10/02/2014  Time:  9:08 AM  Type of Therapy:  Community Meeting   Participation Level:  Active  Participation Quality:  Appropriate and Attentive  Affect:  Appropriate  Cognitive:  Alert and Appropriate  Insight:  Appropriate  Engagement in Group:  Engaged  Modes of Intervention:  Discussion  Summary of Progress/Problems:  Delson Dulworth De'Chelle Suad Autrey 10/02/2014, 9:08 AM

## 2014-10-02 NOTE — Plan of Care (Signed)
Problem: [facility] Participation in Recreation Therapeutic Interventions Goal: STG-Other Recreation Therapy Goal (Specify) STG: Stress Management - Within 5 treatment session, patient with demonstrate at least one stress management technique in each of 2 treatment sessions to increase stress management skills post d/c.  Outcome: Progressing Treatment Session 2; Completed 1 out of 2: At approximately 12:25 pm, LRT met with patient in craft room. Patient reported she read over and practiced the stress management techniques. Patient demonstrated one stress management technique. LRT encouraged patient to continue practicing the stress management techniques.  Leonette Monarch, LRT/CTRS 05.19.16 1:22 pm

## 2014-10-02 NOTE — Progress Notes (Signed)
Patient stated she slept well last night. Voice of appetite good and energy level normal Depression 1 Hopelessness0 and anxiety 2 (low 0-10 high). Patient denies suicidal ideations. Patient denies auditory hallucinations . Attending unit programming. Limited interaction with his peers. Voice of goal today Go to group and stay out of bed.

## 2014-10-02 NOTE — Plan of Care (Signed)
Problem: Alteration in thought process Goal: LTG-Patient has not harmed self or others in at least 2 days Outcome: Progressing No self harm Goal: LTG-Patient behavior demonstrates decreased signs psychosis (Patient behavior demonstrates decreased signs of psychosis to the point the patient is safe to return home and continue treatment in an outpatient setting.)  Outcome: Progressing Oriented x 4. No AVH noted. Goal: LTG-Patient is able to perceive the environment accurately Outcome: Progressing Alert and oriented to environment Goal: LTG-Patient verbalizes understanding importance med regimen (Patient verbalizes understanding of importance of medication regimen and need to continue outpatient care.)  Outcome: Progressing States understanding and participated in medication education. Goal: STG-Patient is able to discuss thoughts with staff Outcome: Progressing Open and pleasant during assessment process.  Goal: STG-Patient does not respond to command hallucinations Outcome: Progressing Not noted to be responding to internal stimuli.

## 2014-10-03 NOTE — B[facility] Group Notes (Signed)
[facility] LCSW Aftercare Discharge Planning Group Note  10/03/2014 1:33 PM  Participation Quality:  Appropriate and Attentive  Affect:  Appropriate  Cognitive:  Alert, Appropriate and Oriented  Insight:  Engaged  Engagement in Group:  Engaged  Modes of Intervention:  Education, Socialization and Support  Summary of Progress/Problems: Pt attended group and partciapated appropriately in discussion. Pt reports that her SMART goal is to "get adjusted to new dosages on my meds".   Carmell Austria T 10/03/2014, 1:33 PM

## 2014-10-03 NOTE — Outcomes Assessment (Signed)
Patient is pleasant and cooperative and notes no needs or distress. She was active in her medication education and notes the desire to learn more about her treatment plan. She denies SI, HI, and AVH. She was passively active in the milieu and appropriate with peers.

## 2014-10-03 NOTE — B[facility] Group Notes (Signed)
[place] Group Notes:  (Nursing/MHT/Case Management/Adjunct)  Date:  10/03/2014  Time:  11:42 AM  Type of Therapy:  Group Therapy  Participation Level:  Did Not Attend  Summary of Progress/Problems:  Grahm Etsitty De'Chelle Keirstin Musil 10/03/2014, 11:42 AM

## 2014-10-03 NOTE — Progress Notes (Signed)
Patient much improved in structured environment. Attending all groups, compliant with medications. Readily discussing issues that resulted in hospitalization, and alternative ways to manage symptoms.  Will continue to monitor mood and mental status, encourage active participation in discharge planning.

## 2014-10-03 NOTE — Plan of Care (Signed)
Problem: Alteration in thought process Goal: LTG-Patient behavior demonstrates decreased signs psychosis (Patient behavior demonstrates decreased signs of psychosis to the point the patient is safe to return home and continue treatment in an outpatient setting.)  Outcome: Progressing Alert and oriented x 4, no signs of psychosis

## 2014-10-03 NOTE — Progress Notes (Signed)
[facility] MD Progress Note  10/03/2014 11:36 AM Melanie Bell  MRN:  [id number] Subjective:  Patient was seen today in the group room attending community meeting. She has been interacting well with peers and has been attending most groups. She has been compliant with medications. She slept well last night and has had good oral intake. Patient reports feeling better this morning.  She denies having  auditory hallucinations. She  denies wanting to hurt herself or hurt anybody else. She denies having visual hallucinations. She denies other major problems with sleep appetite energy or concentration. Patient denies having any side effect effects from her medications. As far as physical complaints she denies having any physical complaints today.   Patient reports that she was tried in the past on Clozaril. At that time she was a patient of [facility].  Patient remembers that it was discontinued because she had a drop in her white blood cells.  She remembers doing really well on Clozaril.  Per nursing: Patient denies SI/HI. Patient denies auditory hallucinations. Pleasant, cooperative and compliant with medications. No issues overnight.  Principal Problem: Schizoaffective disorder Diagnosis:   Patient Active Problem List   Diagnosis Date Noted  . Schizoaffective disorder [F25.9] 09/30/2014  . Tobacco use disorder [Z72.0] 09/30/2014  . H/O foreign body ingestion [Z87.821] 09/29/2014  . Hypothyroid [E03.9] 09/29/2014  . Hypertension [I10] 09/29/2014  . Constipation [K59.00] 09/29/2014   Total Time spent with patient: 30 minutes   Past Medical History:  Past Medical History  Diagnosis Date  . Hallucinations 09/30/2014    Sizoaffective  . Depression   . Anxiety   . Hypertension     Past Surgical History  Procedure Laterality Date  . Breast lumpectomy    . Wisdom tooth extraction     Family History:  Family History  Problem Relation Age of Onset  . Depression Mother   . Hypertension  Mother    Social History:  History  Alcohol Use No     History  Drug Use No    History   Social History  . Marital Status: Single    Spouse Name: N/A  . Number of Children: N/A  . Years of Education: N/A   Social History Main Topics  . Smoking status: Current Every Day Smoker  . Smokeless tobacco: Not on file  . Alcohol Use: No  . Drug Use: No  . Sexual Activity: Not on file   Other Topics Concern  . None   Social History Narrative   Additional History:    Sleep: Good  Appetite:  Good   Assessment:   Musculoskeletal: Strength & Muscle Tone: within normal limits Gait & Station: normal Patient leans: N/A   Psychiatric Specialty Exam: Physical Exam   Review of Systems  HENT: Negative.   Respiratory: Negative.   Cardiovascular: Negative.   Gastrointestinal: Negative.   Skin: Negative.   Neurological: Negative.   Psychiatric/Behavioral: Negative.     Blood pressure 122/83, pulse 68, temperature 98.2 F (36.8 C), temperature source Oral, resp. rate 20, height 5' 8.5" (1.74 m), weight 95.936 kg (211 lb 8 oz).Body mass index is 31.69 kg/(m^2).  General Appearance: Fairly Groomed  Engineer, water::  Good  Speech:  Normal Rate  Volume:  Normal  Mood:  Euthymic  Affect:  Congruent  Thought Process:  concrete  Orientation:  Full (Time, Place, and Person)  Thought Content:  Hallucinations: None  Suicidal Thoughts:  No  Homicidal Thoughts:  No  Memory:  Immediate;  Good Recent;   Good Remote;   Good  Judgement:  Poor  Insight:  Lacking  Psychomotor Activity:  Normal  Concentration:  Good  Recall:  NA  Fund of Knowledge:Fair  Language: Good  Akathisia:  No  Handed:    AIMS (if indicated):     Assets:  Museum/gallery curator Social Support Transportation  ADL's:  Intact  Cognition: WNL  Sleep:  Number of Hours: 7.5     Current Medications: Current Facility-Administered Medications  Medication Dose Route  Frequency Provider Last Rate Last Dose  . acetaminophen (TYLENOL) tablet 650 mg  650 mg Oral Q6H PRN [name], MD   650 mg at 10/01/14 1300  . alum & mag hydroxide-simeth (MAALOX/MYLANTA) 200-200-20 MG/5ML suspension 30 mL  30 mL Oral Q4H PRN [name], MD      . benztropine (COGENTIN) tablet 1 mg  1 mg Oral QHS [name], MD   1 mg at 10/02/14 2152  . docusate sodium (COLACE) capsule 200 mg  200 mg Oral BID [name], MD   200 mg at 10/03/14 4010  . fluPHENAZine (PROLIXIN) tablet 10 mg  10 mg Oral QHS [name], MD   10 mg at 10/02/14 2152  . fluPHENAZine (PROLIXIN) tablet 5 mg  5 mg Oral BID [name], MD   5 mg at 10/03/14 2725  . levothyroxine (SYNTHROID, LEVOTHROID) tablet 75 mcg  75 mcg Oral QAC breakfast [name], MD   75 mcg at 10/03/14 0749  . lithium carbonate (LITHOBID) CR tablet 300 mg  300 mg Oral q morning - 10a [name], MD   300 mg at 10/03/14 3664  . lithium carbonate (LITHOBID) CR tablet 600 mg  600 mg Oral QHS [name], MD   600 mg at 10/02/14 2152  . LORazepam (ATIVAN) tablet 2 mg  2 mg Oral Q6H PRN [name], MD      . lurasidone (LATUDA) tablet 80 mg  80 mg Oral Q supper [name], MD   80 mg at 10/02/14 1635  . nicotine (NICODERM CQ - dosed in mg/24 hours) patch 14 mg  14 mg Transdermal Q0600 [name], MD   14 mg at 10/03/14 0751  . OXcarbazepine (TRILEPTAL) tablet 150 mg  150 mg Oral BID [name], MD   150 mg at 10/03/14 4034  . polyethylene glycol (MIRALAX / GLYCOLAX) packet 17 g  17 g Oral Daily PRN [name], MD      . senna (SENOKOT) tablet 17.2 mg  2 tablet Oral QHS [name], MD   17.2 mg at 10/02/14 2152  . traZODone (DESYREL) tablet 50 mg  50 mg Oral QHS PRN [name], MD      . venlafaxine XR (EFFEXOR-XR) 24  hr capsule 150 mg  150 mg Oral Q breakfast [name], MD   150 mg at 10/03/14 7425    Lab Results:  No results found for this or any previous visit (from the past 48 hour(s)).  Physical Findings: AIMS: Facial and Oral Movements Muscles of Facial Expression: None, normal Lips and Perioral Area: None, normal Jaw: None, normal Tongue: None, normal,Extremity Movements Upper (arms, wrists, hands, fingers): None, normal Lower (legs, knees, ankles, toes): None, normal, Trunk Movements Neck, shoulders, hips: None, normal, Overall Severity Severity of abnormal movements (highest score from questions above): None, normal Incapacitation due to abnormal movements: None, normal Patient's awareness of abnormal movements (rate only patient's report): No Awareness, Dental Status Current problems with teeth and/or  dentures?: No Does patient usually wear dentures?: No  CIWA:  CIWA-Ar Total: 0 COWS:  COWS Total Score: 0  Treatment Plan Summary: Daily contact with patient to assess and evaluate symptoms and progress in treatment and Medication management -Schizoaffective disorder:  Psychosis:Patient will be continued on fluphenazine  5 mg in the morning, 5 mg at 4 PM and 10 mg by mouth daily at bedtime. She will also will be continued on Latuda 80 mg at bedtime. Tolerating well medications. Denies side effects. Medications have decreased intensity of hallucinations.  Mood: Patient will be continued on lithium CR 300 mg by mouth every morning and 600 mg by mouth daily at bedtime. Patient will also be continued on Trileptal 150 mg by mouth twice a day.  Lithium level on 5/18 0.6 .Continue venlafaxine ex are 150 mg by mouth every morning  -EPS: Continue benztropine 1 mg by mouth daily at bedtime  -Agitation: We'll order Ativan 2 mg every 6 hours when necessary  -Insomnia: Will continue the patient on trazodone 50 mg by mouth daily at bedtime when necessary  -Chronic constipation:  Continue Colace 200 mg by mouth twice a day and Senokot 2 tablets by mouth daily at bedtime.  Continue MiraLAX when necessary  -Hypothyroidism: Patient will be continued on Synthroid 75 g daily  -Hypertension I will not continue the patient on lisinopril as these medication increases the risk for lithium toxicity. Blood pressure and heart rate continued to be within the normal limits doesn't seem to be a need to add an antihypertensive.  -Tobacco use disorder: start the patient on nicotine patch 14 mg  -Foreign body ingestion: abdominal xray from 5/16: Two safety pins are again identified. One of these has progressed and now projects over the distal descending colon. The second projects in the right lower quadrant of the abdomen and may be in the distal ileum. No new foreign body is identified. There is a massive volume of stool in the transverse and descending colon. No evidence of small bowel obstruction is seen.  -Collateral information: Group home manager has been contacted she continues to report concerns about the patient's safety as she has a history of swallowing objects.  They don't think the patient is states at this time.  Contacted guardian today [phone] but not able to leave message in her voicemail .  I'll attempt again today and I will discuss the possibility of starting the patient on Clozaril as she is failing to respond to the combination of Prolixin and Latuda.  Patient reports neutropenia in the past with Clozaril however the guidelines have to change and the [place] can be as low as 500. It is unclear as to what was her [place] at that time  -Discharge planning: Once a stable patient will be discharged back to her group home.  If patient continues to do well over the weekend mostly we'll discharge the patient on Monday. Today is the first day free of hallucinations.  Labs: TSH wnl, lipid panel slightly increased LDL, Lithium level 0.6.  Hemoglobin A1c is 5.2  Medical Decision  Making:  Established Problem, Stable/Improving (1)     [name] 10/03/2014, 11:36 AM

## 2014-10-03 NOTE — Progress Notes (Signed)
Recreation Therapy Notes  Date: 05.20.16 Time: 3:00 pm Location: Craft Room  Group Topic: Problem solving, Communication, Teamwork  Goal Area(s) Addresses:  Patient will effectively work with peer towards shared goal. Patient will identify skills used to make activity successful. Patient will identify benefit of using group skills effectively post d/c.  Behavioral Response: Attentive, Interactive  Intervention: [employer]  Activity: Patients were given 15 pipe cleaners and instructed to build the [employer]. After about 5 minutes, LRT told patients they had to put their dominant hand behind their back. After 3 minutes, LRT instructed patients that they could not talk to each other.  Education: LRT educated patient on communication, problem solving, and teamwork and how those skills relate to healthy support systems.  Education Outcome: Acknowledges education/In group clarification offered  Clinical Observations/Feedback: Patient worked with peers to build tower. Patient used effective communication, problem solving, and teamwork. Patient contributed to group discussion by stating how her team worked effectively together, how they used communication, problem solving, and teamwork effectively, how these skills are related to building a healthy support system, how she felt after she used these skills in group, and what would change if she started using these skills effectively at home.   Leonette Monarch, LRT/CTRS 10/03/2014 4:01 PM

## 2014-10-03 NOTE — Plan of Care (Signed)
Problem: [facility] Participation in Recreation Therapeutic Interventions Goal: STG-Other Recreation Therapy Goal (Specify) STG: Stress Management - Within 5 treatment session, patient with demonstrate at least one stress management technique in each of 2 treatment sessions to increase stress management skills post d/c.  Outcome: Completed/Met Date Met:  10/03/14 Treatment Session 1; Completed 2 out of 2: At approximately 1:10 pm, LRT met with patient in patient room. Patient demonstrated one stress management technique. LRT encouraged patient to continue practicing the stress management techniques.  Leonette Monarch, LRT/CTRS 05.20.16 1:22 pm

## 2014-10-03 NOTE — Plan of Care (Signed)
Problem: Alteration in thought process Goal: LTG-Patient has not harmed self or others in at least 2 days Outcome: Progressing No self harm     

## 2014-10-04 NOTE — Outcomes Assessment (Signed)
Patient is pleasant and cooperative. She notes an understanding of her medications and a willingness for continued education. She is hesitant to interact with her peers, but pleasant when addressed. She notes a plan to eat hard candy instead of swallowing inedible objects and is pleased with her progress. She denies SI, HI, and AVH.

## 2014-10-04 NOTE — BHH Group Notes (Signed)
[place] LCSW Group Therapy  10/04/2014 1:57 PM  Type of Therapy:  Group Therapy  Participation Level:  Active  Participation Quality:  Appropriate  Affect:  Appropriate  Cognitive:  Appropriate  Insight:  Engaged  Engagement in Therapy:  Developing/Improving  Modes of Intervention:  Discussion, Education, Exploration and Problem-solving  Summary of Progress/Problems:patient was able to share her diagnosis and the struggle she has with her voices that tell her to swallow things ( razors) She quickly added she doesn't do that anymore.  [place] 10/04/2014, 1:57 PM

## 2014-10-04 NOTE — BHH Group Notes (Signed)
[place] Group Notes:  (Nursing/MHT/Case Management/Adjunct)  Date:  10/04/2014  Time:  12:08 AM  Type of Therapy:  Group Therapy  Participation Level:  Active  Participation Quality:  Appropriate, Attentive and Sharing  Affect:  Appropriate  Cognitive:  Appropriate  Insight:  Appropriate  Engagement in Group:  Engaged  Modes of Intervention:  Support    Summary of Progress/Problems:  Melanie Bell 10/04/2014, 12:08 AM

## 2014-10-04 NOTE — BHH Group Notes (Signed)
[place] LCSW Group Therapy  10/04/2014 2:00 PM  Type of Therapy:  Group Therapy  Participation Level:  Active  Participation Quality:  Attentive  Affect:  Appropriate  Cognitive:  Appropriate  Insight:  Engaged  Engagement in Therapy:  Engaged  Modes of Intervention:  Discussion, Education, Exploration and Problem-solving  Summary of Progress/Problems: patient was able to discuss some community day programs and what its like in the group home. She was able to clearly explain her programs and even discussed some conduct rules in the day program. This supported another peer who may be soon going to live in a group home  [place] 10/04/2014, 2:00 PM

## 2014-10-04 NOTE — Plan of Care (Signed)
Problem: Alteration in thought process Goal: LTG-Patient has not harmed self or others in at least 2 days Outcome: Progressing No self harm. Goal: LTG-Patient behavior demonstrates decreased signs psychosis (Patient behavior demonstrates decreased signs of psychosis to the point the patient is safe to return home and continue treatment in an outpatient setting.)  Outcome: Progressing Patient pleasant and cooperative.

## 2014-10-04 NOTE — B[facility] Group Notes (Signed)
[place] Group Notes:  (Nursing/MHT/Case Management/Adjunct)  Date:  10/04/2014  Time:  10:11 PM  Type of Therapy:  Group Therapy  Participation Level:  Active  Participation Quality:  Appropriate and Sharing  Affect:  Appropriate  Cognitive:  Appropriate  Insight:  Appropriate  Engagement in Group:  Engaged  Modes of Intervention:  Support  Summary of Progress/Problems:  Melanie Bell 10/04/2014, 10:11 PM

## 2014-10-04 NOTE — Progress Notes (Signed)
[facility] MD Progress Note  10/04/2014 3:35 PM Melanie Bell  MRN:  [id number] Subjective: Patient reports that she is feeling better today. She thinks her antipsychotics are working well. She denies having any depression. She says her auditory hallucinations are much less than they were before. She is not aware of having any side effects. Denies suicidal ideation and denies any thoughts about wanting to overdose on any medication or to take any foreign bodies into her mouth Principal Problem: Schizoaffective disorder Diagnosis:   Patient Active Problem List   Diagnosis Date Noted  . Schizoaffective disorder [F25.9] 09/30/2014  . Tobacco use disorder [Z72.0] 09/30/2014  . H/O foreign body ingestion [Z87.821] 09/29/2014  . Hypothyroid [E03.9] 09/29/2014  . Hypertension [I10] 09/29/2014  . Constipation [K59.00] 09/29/2014   Total Time spent with patient: 30 minutes   Past Medical History:  Past Medical History  Diagnosis Date  . Hallucinations 09/30/2014    Sizoaffective  . Depression   . Anxiety   . Hypertension     Past Surgical History  Procedure Laterality Date  . Breast lumpectomy    . Wisdom tooth extraction     Family History:  Family History  Problem Relation Age of Onset  . Depression Mother   . Hypertension Mother    Social History:  History  Alcohol Use No     History  Drug Use No    History   Social History  . Marital Status: Single    Spouse Name: N/A  . Number of Children: N/A  . Years of Education: N/A   Social History Main Topics  . Smoking status: Current Every Day Smoker  . Smokeless tobacco: Not on file  . Alcohol Use: No  . Drug Use: No  . Sexual Activity: Not on file   Other Topics Concern  . None   Social History Narrative   Additional History:    Sleep: Good  Appetite:  Good   Assessment:   Musculoskeletal: Strength & Muscle Tone: within normal limits Gait & Station: normal Patient leans: N/A   Psychiatric Specialty  Exam: Physical Exam   ROS   Blood pressure currently 135/85 temperature 98.2 pulse 73 respirations 20   General Appearance: Fairly Groomed  Engineer, water::  Good  Speech:  Normal Rate  Volume:  Normal  Mood:  Euthymic  Affect:  Congruent  Thought Process:  concrete  Orientation:  Full (Time, Place, and Person)  Thought Content:  Hallucinations: None  Suicidal Thoughts:  No  Homicidal Thoughts:  No  Memory:  Immediate;   Good Recent;   Good Remote;   Good  Judgement:  Poor  Insight:  Lacking  Psychomotor Activity:  Normal  Concentration:  Good  Recall:  NA  Fund of Knowledge:Fair  Language: Good  Akathisia:  No  Handed:    AIMS (if indicated):     Assets:  Museum/gallery curator Social Support Transportation  ADL's:  Intact  Cognition: WNL  Sleep:  Number of Hours: 7.75     Current Medications: Current Facility-Administered Medications  Medication Dose Route Frequency Provider Last Rate Last Dose  . acetaminophen (TYLENOL) tablet 650 mg  650 mg Oral Q6H PRN [name], MD   650 mg at 10/03/14 1614  . alum & mag hydroxide-simeth (MAALOX/MYLANTA) 200-200-20 MG/5ML suspension 30 mL  30 mL Oral Q4H PRN [name], MD      . benztropine (COGENTIN) tablet 1 mg  1 mg Oral QHS [name], MD  1 mg at 10/03/14 2157  . docusate sodium (COLACE) capsule 200 mg  200 mg Oral BID [name], MD   200 mg at 10/04/14 0932  . fluPHENAZine (PROLIXIN) tablet 10 mg  10 mg Oral QHS [name], MD   10 mg at 10/03/14 2157  . fluPHENAZine (PROLIXIN) tablet 5 mg  5 mg Oral BID [name], MD   5 mg at 10/04/14 0931  . levothyroxine (SYNTHROID, LEVOTHROID) tablet 75 mcg  75 mcg Oral QAC breakfast [name], MD   75 mcg at 10/04/14 0801  . lithium carbonate (LITHOBID) CR tablet 300 mg  300 mg Oral q morning - 10a [name], MD   300 mg at  10/04/14 0932  . lithium carbonate (LITHOBID) CR tablet 600 mg  600 mg Oral QHS [name], MD   600 mg at 10/03/14 2157  . LORazepam (ATIVAN) tablet 2 mg  2 mg Oral Q6H PRN [name], MD      . lurasidone (LATUDA) tablet 80 mg  80 mg Oral Q supper [name], MD   80 mg at 10/03/14 1613  . nicotine (NICODERM CQ - dosed in mg/24 hours) patch 14 mg  14 mg Transdermal Q0600 [name], MD   14 mg at 10/04/14 0932  . OXcarbazepine (TRILEPTAL) tablet 150 mg  150 mg Oral BID [name], MD   150 mg at 10/04/14 0931  . polyethylene glycol (MIRALAX / GLYCOLAX) packet 17 g  17 g Oral Daily PRN [name], MD      . senna (SENOKOT) tablet 17.2 mg  2 tablet Oral QHS [name], MD   17.2 mg at 10/03/14 2157  . traZODone (DESYREL) tablet 50 mg  50 mg Oral QHS PRN [name], MD      . venlafaxine XR (EFFEXOR-XR) 24 hr capsule 150 mg  150 mg Oral Q breakfast [name], MD   150 mg at 10/04/14 0801    Lab Results:  No results found for this or any previous visit (from the past 9 hour(s)).  Physical Findings: AIMS: Facial and Oral Movements Muscles of Facial Expression: None, normal Lips and Perioral Area: None, normal Jaw: None, normal Tongue: None, normal,Extremity Movements Upper (arms, wrists, hands, fingers): None, normal Lower (legs, knees, ankles, toes): None, normal, Trunk Movements Neck, shoulders, hips: None, normal, Overall Severity Severity of abnormal movements (highest score from questions above): None, normal Incapacitation due to abnormal movements: None, normal Patient's awareness of abnormal movements (rate only patient's report): No Awareness, Dental Status Current problems with teeth and/or dentures?: No Does patient usually wear dentures?: No  CIWA:  CIWA-Ar Total: 0 COWS:  COWS Total Score: 0  Treatment Plan Summary: Daily contact with  patient to assess and evaluate symptoms and progress in treatment and Medication management -Schizoaffective disorder:  Psychosis:Patient will be continued on fluphenazine  5 mg in the morning, 5 mg at 4 PM and 10 mg by mouth daily at bedtime. She will also will be continued on Latuda 80 mg at bedtime. Tolerating well medications. Denies side effects. Medications have decreased intensity of hallucinations.  Mood: Patient will be continued on lithium CR 300 mg by mouth every morning and 600 mg by mouth daily at bedtime. Patient will also be continued on Trileptal 150 mg by mouth twice a day.  Lithium level on 5/18 0.6 .Continue venlafaxine ex are 150 mg by mouth every morning  -EPS: Continue benztropine 1 mg by mouth daily at bedtime  -Agitation: We'll order Ativan  2 mg every 6 hours when necessary  -Insomnia: Will continue the patient on trazodone 50 mg by mouth daily at bedtime when necessary  -Chronic constipation: Continue Colace 200 mg by mouth twice a day and Senokot 2 tablets by mouth daily at bedtime.  Continue MiraLAX when necessary  -Hypothyroidism: Patient will be continued on Synthroid 75 g daily  -Hypertension I will not continue the patient on lisinopril as these medication increases the risk for lithium toxicity. Blood pressure and heart rate continued to be within the normal limits doesn't seem to be a need to add an antihypertensive.  -Tobacco use disorder: start the patient on nicotine patch 14 mg  -Foreign body ingestion: abdominal xray from 5/16: Two safety pins are again identified. One of these has progressed and now projects over the distal descending colon. The second projects in the right lower quadrant of the abdomen and may be in the distal ileum. No new foreign body is identified. There is a massive volume of stool in the transverse and descending colon. No evidence of small bowel obstruction is seen.  -Collateral information: Group home manager has been  contacted she continues to report concerns about the patient's safety as she has a history of swallowing objects.  They don't think the patient is states at this time.  Contacted guardian today [phone] but not able to leave message in her voicemail .  I'll attempt again today and I will discuss the possibility of starting the patient on Clozaril as she is failing to respond to the combination of Prolixin and Latuda.  Patient reports neutropenia in the past with Clozaril however the guidelines have to change and the [place] can be as low as 500. It is unclear as to what was her [place] at that time  -Discharge planning: Once a stable patient will be discharged back to her group home.  If patient continues to do well over the weekend mostly we'll discharge the patient on Monday. Today is the first day free of hallucinations.  Labs: TSH wnl, lipid panel slightly increased LDL, Lithium level 0.6.  Hemoglobin A1c is 5.2 No change to current treatment plan. Patient is doing well. Supportive counseling completed. Reviewed chart. No change to diagnoses  Medical Decision Making:  Established Problem, Stable/Improving (1)     [place] Ingr[place] 10/04/2014, 3:35 PM

## 2014-10-04 NOTE — Plan of Care (Signed)
Problem: Ineffective individual coping Goal: STG: Patient will remain free from self harm Outcome: Progressing Patient taking prescribed meds. Attending groups. Denies SI. Remains on q 15 minute checks. No evidence of self-harm.

## 2014-10-04 NOTE — Progress Notes (Signed)
Patient has been attending groups and interacting with others on the unit today. Affect is constricted but showing range. Mood is calm and cooperative. She denies feeling depressed or having thoughts of harming herself. Cooperative with meds. She states that her goal for today is to "Stay positive about my life and don't let little things upset me." Offered emotional support and encouragement. Continue to monitor.

## 2014-10-05 NOTE — Progress Notes (Signed)
Patient continues to report feeling that her mood has improved. Denies SI. Rates depression 0/10. States she is looking forward to being discharged next week and believes she is prepared.

## 2014-10-05 NOTE — [facility] Group Notes (Signed)
[facility] LCSW Group Therapy  10/05/2014 1:10 PM  Type of Therapy:  Group Therapy  Participation Level:  Active  Participation Quality:  Attentive  Affect:  Appropriate  Cognitive:  Appropriate  Insight:  Supportive  Engagement in Therapy:  Engaged  Modes of Intervention:  Discussion, Exploration and Problem-solving  Summary of Progress/Problems: Patient was introduced to group discussion topic of Holding onto Grudges. She participated well and shared ideas on how to let things go. She identified journaling as a way she releases her pain. She was supportive of others.  Pauletta Browns, Kadesia Robel M 10/05/2014, 1:10 PM

## 2014-10-05 NOTE — Outcomes Assessment (Signed)
Patient is pleasant, cooperative, and able to list all of her medications and why she takes them. She spoke freely about her mother's mental health and physical health struggles. She notes she is starting to get a better understanding of her mother and is understanding she needs to make healthy choices for herself. She continues to have the plan to eat hard candy instead of ingesting harmful objects. She denies SI, HI, and AVH.

## 2014-10-05 NOTE — Progress Notes (Signed)
LCSW working on discharge documentation. She is pleased she may be returning soon to group home. FL2 completed and awaiting signature. Family care home is notified that discharge will be Monday early afternoon. Guardian/mother will be notified tommorow.

## 2014-10-05 NOTE — Plan of Care (Signed)
Problem: Ineffective individual coping Goal: STG: Pt will be able to identify effective and ineffective STG: Pt will be able to identify effective and ineffective coping patterns  Outcome: Progressing Patient identifying ways of coping rather than eating harmful objects, such as eating hard candy.

## 2014-10-05 NOTE — Progress Notes (Signed)
[facility] MD Progress Note  10/05/2014 2:05 PM Melanie Bell  MRN:  [id number] Subjective: Patient reports that she is feeling better today. She thinks her antipsychotics are working well. She denies having any depression. She says her auditory hallucinations are much less than they were before. She is not aware of having any side effects. Denies suicidal ideation and denies any thoughts about wanting to overdose on any medication or to take any foreign bodies into her mouth. On Sunday patient continues to report her mood is good. Denies suicidal ideation. Denies hallucinations. She reports that she is not aware of any tardive dyskinesia. No new complaints.  Principal Problem: Schizoaffective disorder Diagnosis:   Patient Active Problem List   Diagnosis Date Noted  . Schizoaffective disorder [F25.9] 09/30/2014  . Tobacco use disorder [Z72.0] 09/30/2014  . H/O foreign body ingestion [Z87.821] 09/29/2014  . Hypothyroid [E03.9] 09/29/2014  . Hypertension [I10] 09/29/2014  . Constipation [K59.00] 09/29/2014   Total Time spent with patient: 30 minutes   Past Medical History:  Past Medical History  Diagnosis Date  . Hallucinations 09/30/2014    Sizoaffective  . Depression   . Anxiety   . Hypertension     Past Surgical History  Procedure Laterality Date  . Breast lumpectomy    . Wisdom tooth extraction     Family History:  Family History  Problem Relation Age of Onset  . Depression Mother   . Hypertension Mother    Social History:  History  Alcohol Use No     History  Drug Use No    History   Social History  . Marital Status: Single    Spouse Name: N/A  . Number of Children: N/A  . Years of Education: N/A   Social History Main Topics  . Smoking status: Current Every Day Smoker  . Smokeless tobacco: Not on file  . Alcohol Use: No  . Drug Use: No  . Sexual Activity: Not on file   Other Topics Concern  . None   Social History Narrative   Additional History:     Sleep: Good  Appetite:  Good   Assessment: Patient is showing stable improvement. Appears to be at her baseline mentally. No active psychosis. Denies suicidal ideation. Not acting out. Tolerating medicine well. No physical complaints. I discussed with the nurse that both of [name] have noticed some signs of possible tardive dyskinesia in the patient's mouth and face. Patient herself has no notice of it. I encouraged her to discuss it with treatment providers and told her I would mention it in the note. Patient appears to be stable for possible consideration of discharge tomorrow  Musculoskeletal: Strength & Muscle Tone: within normal limits Gait & Station: normal Patient leans: N/A   Psychiatric Specialty Exam: Physical Exam   ROS   Blood pressure currently 135/85 temperature 98.2 pulse 73 respirations 20   General Appearance: Fairly Groomed  Engineer, water::  Good  Speech:  Normal Rate  Volume:  Normal  Mood:  Euthymic  Affect:  Congruent  Thought Process:  concrete  Orientation:  Full (Time, Place, and Person)  Thought Content:  Hallucinations: None  Suicidal Thoughts:  No  Homicidal Thoughts:  No  Memory:  Immediate;   Good Recent;   Good Remote;   Good  Judgement:  Poor  Insight:  Lacking  Psychomotor Activity:  Normal  Concentration:  Good  Recall:  NA  Fund of Knowledge:Fair  Language: Good  Akathisia:  No  Handed:  AIMS (if indicated):     Assets:  Museum/gallery curator Social Support Transportation  ADL's:  Intact  Cognition: WNL  Sleep:  Number of Hours: 6.5     Current Medications: Current Facility-Administered Medications  Medication Dose Route Frequency Provider Last Rate Last Dose  . acetaminophen (TYLENOL) tablet 650 mg  650 mg Oral Q6H PRN [name], MD   650 mg at 10/03/14 1614  . alum & mag hydroxide-simeth (MAALOX/MYLANTA) 200-200-20 MG/5ML suspension 30 mL  30 mL Oral Q4H PRN [name], MD      . benztropine (COGENTIN) tablet 1 mg  1 mg Oral QHS [name], MD   1 mg at 10/04/14 2136  . docusate sodium (COLACE) capsule 200 mg  200 mg Oral BID [name], MD   200 mg at 10/05/14 0940  . fluPHENAZine (PROLIXIN) tablet 10 mg  10 mg Oral QHS [name], MD   10 mg at 10/04/14 2135  . fluPHENAZine (PROLIXIN) tablet 5 mg  5 mg Oral BID [name], MD   5 mg at 10/05/14 0940  . levothyroxine (SYNTHROID, LEVOTHROID) tablet 75 mcg  75 mcg Oral QAC breakfast [name], MD   75 mcg at 10/05/14 [phone]  . lithium carbonate (LITHOBID) CR tablet 300 mg  300 mg Oral q morning - 10a [name], MD   300 mg at 10/05/14 0940  . lithium carbonate (LITHOBID) CR tablet 600 mg  600 mg Oral QHS [name], MD   600 mg at 10/04/14 2134  . LORazepam (ATIVAN) tablet 2 mg  2 mg Oral Q6H PRN [name], MD      . lurasidone (LATUDA) tablet 80 mg  80 mg Oral Q supper [name], MD   80 mg at 10/04/14 1720  . nicotine (NICODERM CQ - dosed in mg/24 hours) patch 14 mg  14 mg Transdermal Q0600 [name], MD   14 mg at 10/05/14 0942  . OXcarbazepine (TRILEPTAL) tablet 150 mg  150 mg Oral BID [name], MD   150 mg at 10/05/14 0945  . polyethylene glycol (MIRALAX / GLYCOLAX) packet 17 g  17 g Oral Daily PRN [name], MD      . senna (SENOKOT) tablet 17.2 mg  2 tablet Oral QHS [name], MD   17.2 mg at 10/04/14 2135  . traZODone (DESYREL) tablet 50 mg  50 mg Oral QHS PRN [name], MD      . venlafaxine XR (EFFEXOR-XR) 24 hr capsule 150 mg  150 mg Oral Q breakfast [name], MD   150 mg at 10/05/14 6160    Lab Results:  No results found for this or any previous visit (from the past 48 hour(s)).  Physical Findings: AIMS: Facial and Oral Movements Muscles of Facial  Expression: Mild Lips and Perioral Area: Mild Jaw: None, normal Tongue: Mild,Extremity Movements Upper (arms, wrists, hands, fingers): None, normal Lower (legs, knees, ankles, toes): None, normal, Trunk Movements Neck, shoulders, hips: None, normal, Overall Severity Severity of abnormal movements (highest score from questions above): Mild Incapacitation due to abnormal movements: Minimal Patient's awareness of abnormal movements (rate only patient's report): No Awareness, Dental Status Current problems with teeth and/or dentures?: No Does patient usually wear dentures?: No  CIWA:  CIWA-Ar Total: 0 COWS:  COWS Total Score: 0  Treatment Plan Summary: Daily contact with patient to assess and evaluate symptoms and progress in treatment and Medication management -Schizoaffective disorder:  Psychosis:Patient will be continued on fluphenazine  5  mg in the morning, 5 mg at 4 PM and 10 mg by mouth daily at bedtime. She will also will be continued on Latuda 80 mg at bedtime. Tolerating well medications. Denies side effects. Medications have decreased intensity of hallucinations.  Mood: Patient will be continued on lithium CR 300 mg by mouth every morning and 600 mg by mouth daily at bedtime. Patient will also be continued on Trileptal 150 mg by mouth twice a day.  Lithium level on 5/18 0.6 .Continue venlafaxine ex are 150 mg by mouth every morning  -EPS: Continue benztropine 1 mg by mouth daily at bedtime  -Agitation: We'll order Ativan 2 mg every 6 hours when necessary  -Insomnia: Will continue the patient on trazodone 50 mg by mouth daily at bedtime when necessary  -Chronic constipation: Continue Colace 200 mg by mouth twice a day and Senokot 2 tablets by mouth daily at bedtime.  Continue MiraLAX when necessary  -Hypothyroidism: Patient will be continued on Synthroid 75 g daily  -Hypertension I will not continue the patient on lisinopril as these medication increases the risk for lithium  toxicity. Blood pressure and heart rate continued to be within the normal limits doesn't seem to be a need to add an antihypertensive.  -Tobacco use disorder: start the patient on nicotine patch 14 mg  -Foreign body ingestion: abdominal xray from 5/16: Two safety pins are again identified. One of these has progressed and now projects over the distal descending colon. The second projects in the right lower quadrant of the abdomen and may be in the distal ileum. No new foreign body is identified. There is a massive volume of stool in the transverse and descending colon. No evidence of small bowel obstruction is seen.  -Collateral information: Group home manager has been contacted she continues to report concerns about the patient's safety as she has a history of swallowing objects.  They don't think the patient is states at this time.  Contacted guardian today [phone] but not able to leave message in her voicemail .  I'll attempt again today and I will discuss the possibility of starting the patient on Clozaril as she is failing to respond to the combination of Prolixin and Latuda.  Patient reports neutropenia in the past with Clozaril however the guidelines have to change and the [place] can be as low as 500. It is unclear as to what was her [place] at that time  -Discharge planning: Once a stable patient will be discharged back to her group home.  If patient continues to do well over the weekend mostly we'll discharge the patient on Monday.  Labs: TSH wnl, lipid panel slightly increased LDL, Lithium level 0.6.  Hemoglobin A1c is 5.2 No change to current treatment plan. Patient is doing well. Supportive counseling completed. Reviewed chart. No change to diagnoses patient is doing well. No change to medicine today. Medically he appears to be stable. Supportive counseling completed  Medical Decision Making:  Established Problem, Stable/Improving (1)     Mckyle Solanki 10/05/2014, 2:05 PM

## 2014-10-06 ENCOUNTER — Inpatient Hospital Stay: Payer: MEDICAID

## 2014-10-06 DIAGNOSIS — G2401 Drug induced subacute dyskinesia: Secondary | ICD-10-CM

## 2014-10-06 IMAGING — CR DG ABDOMEN 1V
1 series · 2 of 2 positions shown · non-contrast
Comparison: Multiple exams the most recent of which is dated
[DATE]

CLINICAL DATA: Check for progress of foreign body, history of
swallowed safety pins

EXAM:
ABDOMEN - 1 VIEW

[Series 1: dg abd 1 view · 0.14mm/px · 2 of 2 slices shown]
[im 1/2]
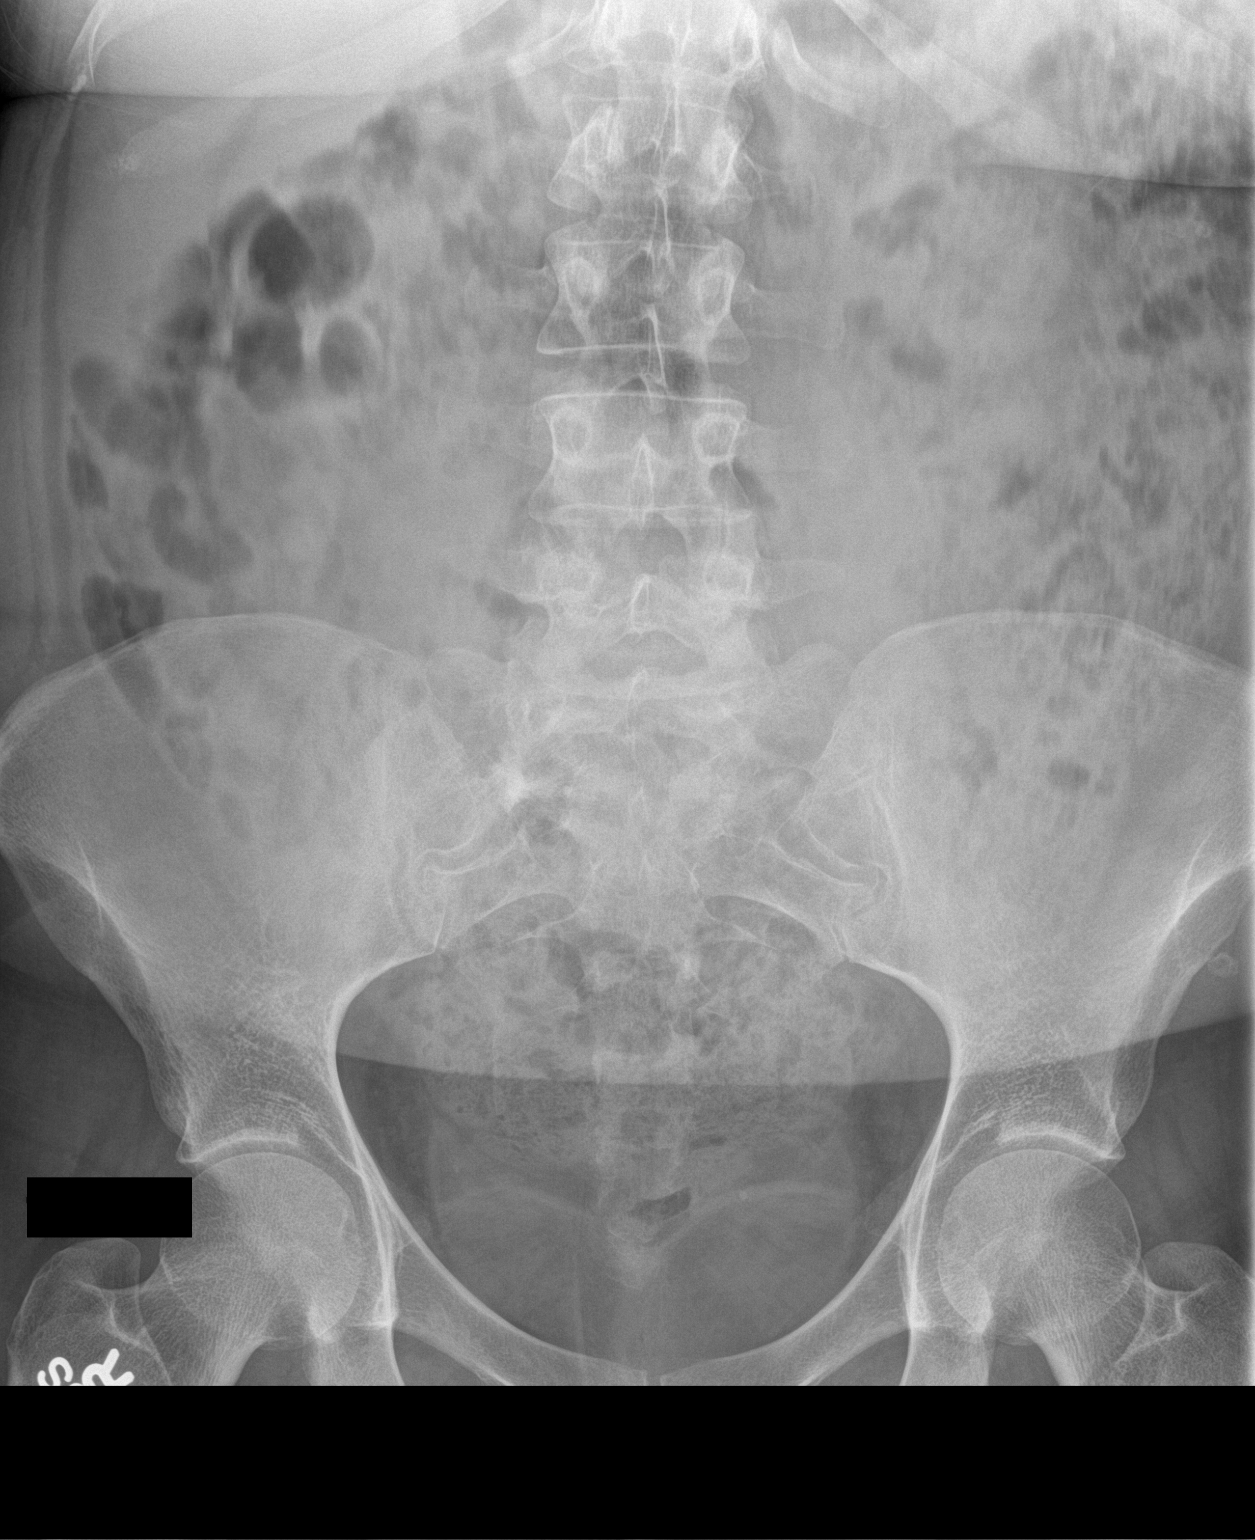
[im 2/2]
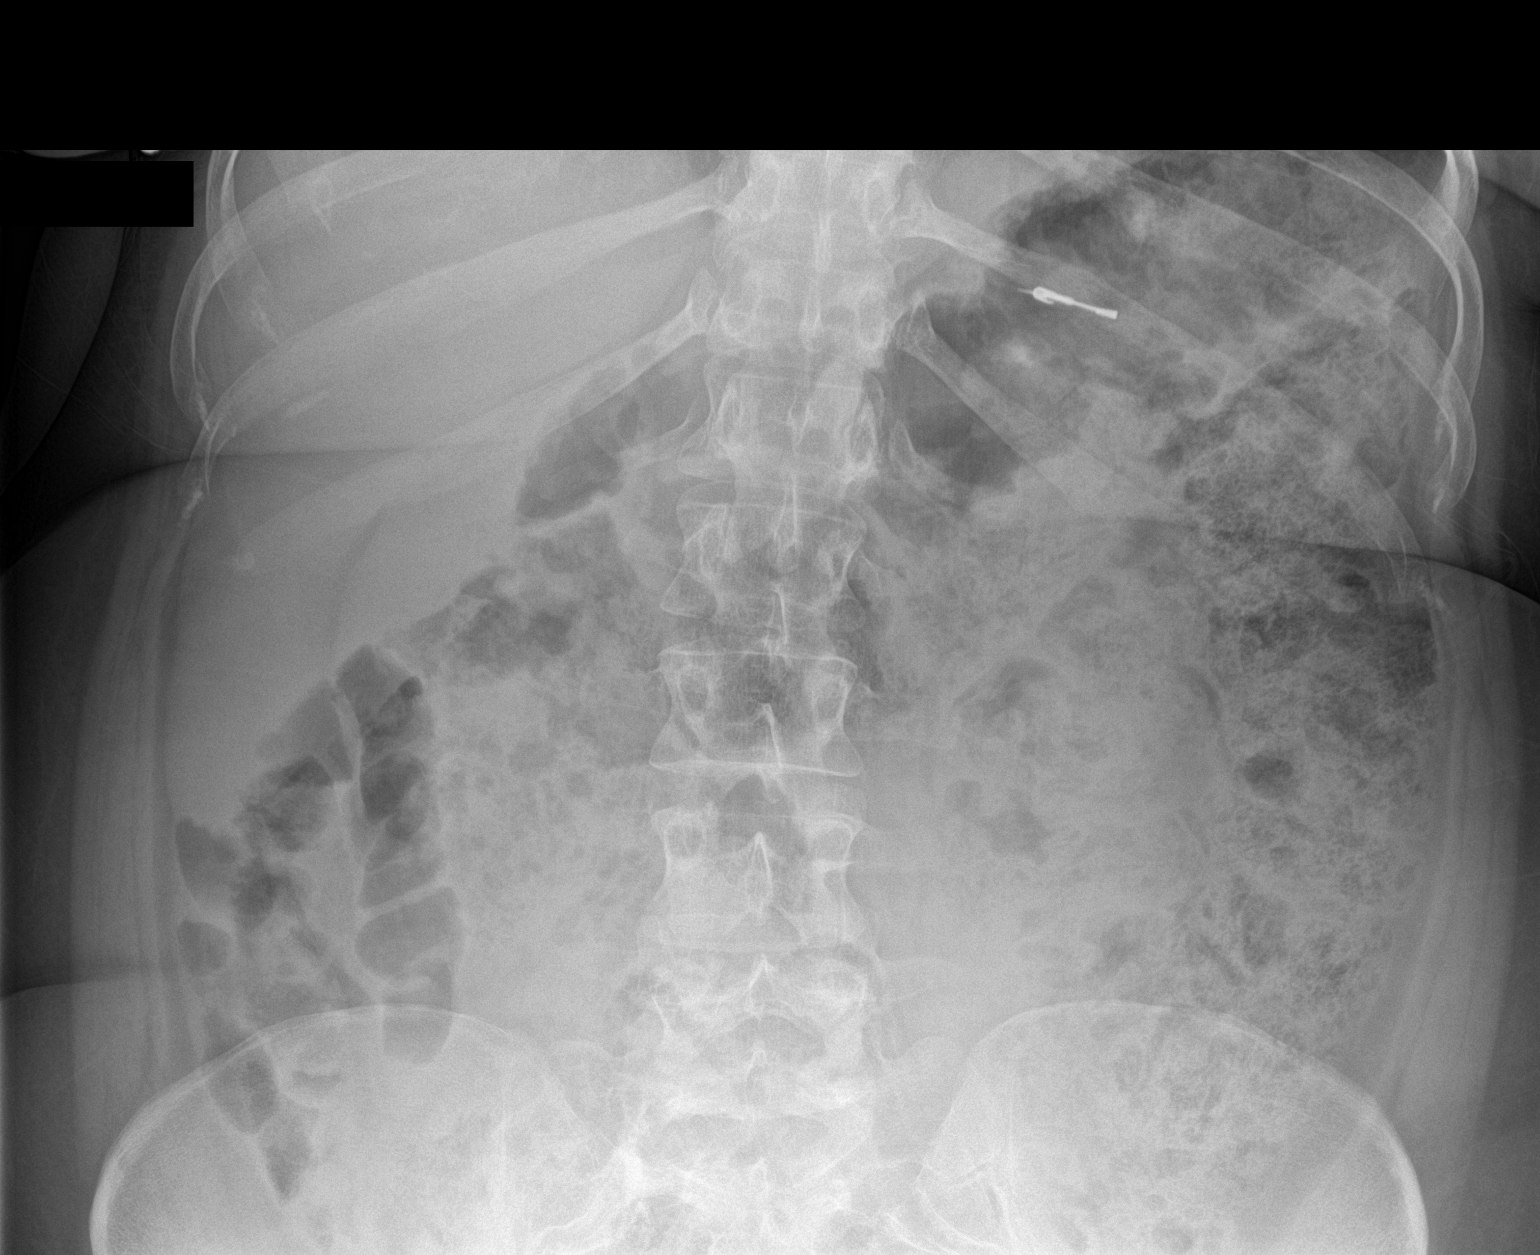

[2 of 2 positions shown; findings below may reference images not displayed]

FINDINGS: Scattered large and small bowel gas is noted. A considerable amount
of fecal material is noted throughout the colon. The previously seen
closed safety pin in the distal colon has apparently passed and is
no longer present. The open safety pin now lies within the distal
transverse colon progressive from the right lower quadrant on the
prior exam. Continued followup is recommended.
IMPRESSION: Passage of 1 of the 2 safety pins. The second safety pin which is
the open safety pin has now progressed to the distal transverse
colon. Continued followup is recommended.

## 2014-10-06 MED ORDER — POLYETHYLENE GLYCOL 3350 17 G PO PACK: 17.0000 g | PACK | Freq: Every day | ORAL | Status: DC | PRN

## 2014-10-06 MED ORDER — LITHIUM CARBONATE ER 300 MG PO TBCR: 600.0000 mg | EXTENDED_RELEASE_TABLET | Freq: Every day | ORAL | Status: DC

## 2014-10-06 MED ORDER — TRAZODONE HCL 50 MG PO TABS: 50.0000 mg | ORAL_TABLET | Freq: Every evening | ORAL | Status: DC | PRN

## 2014-10-06 MED ORDER — LITHIUM CARBONATE ER 300 MG PO TBCR: 300.0000 mg | EXTENDED_RELEASE_TABLET | Freq: Every morning | ORAL | Status: DC

## 2014-10-06 MED ORDER — VENLAFAXINE HCL ER 150 MG PO CP24: 150.0000 mg | ORAL_CAPSULE | Freq: Every day | ORAL | Status: DC

## 2014-10-06 MED ORDER — BENZTROPINE MESYLATE 1 MG PO TABS: 1.0000 mg | ORAL_TABLET | Freq: Every day | ORAL | Status: DC

## 2014-10-06 MED ORDER — FLUPHENAZINE HCL 5 MG PO TABS: 5.0000 mg | ORAL_TABLET | Freq: Two times a day (BID) | ORAL | Status: DC

## 2014-10-06 MED ORDER — DOCUSATE SODIUM 100 MG PO CAPS: 200.0000 mg | ORAL_CAPSULE | Freq: Two times a day (BID) | ORAL | Status: DC

## 2014-10-06 MED ORDER — SENNA 8.6 MG PO TABS: 2.0000 | ORAL_TABLET | Freq: Every day | ORAL | Status: DC

## 2014-10-06 MED ORDER — LURASIDONE HCL 80 MG PO TABS: 80.0000 mg | ORAL_TABLET | Freq: Every day | ORAL | Status: DC

## 2014-10-06 MED ORDER — OXCARBAZEPINE 150 MG PO TABS: 150.0000 mg | ORAL_TABLET | Freq: Two times a day (BID) | ORAL | Status: DC

## 2014-10-06 MED ORDER — LEVOTHYROXINE SODIUM 75 MCG PO TABS: 75.0000 ug | ORAL_TABLET | Freq: Every day | ORAL | Status: DC

## 2014-10-06 MED ORDER — FLUPHENAZINE HCL 10 MG PO TABS: 10.0000 mg | ORAL_TABLET | Freq: Every day | ORAL | Status: DC

## 2014-10-06 NOTE — Discharge Instructions (Addendum)
Take all medications as directed.  

## 2014-10-06 NOTE — Plan of Care (Signed)
Problem: Alteration in thought process Goal: LTG-Patient behavior demonstrates decreased signs psychosis (Patient behavior demonstrates decreased signs of psychosis to the point the patient is safe to return home and continue treatment in an outpatient setting.)  Outcome: Progressing Patient denies any hallucinations at present.

## 2014-10-06 NOTE — Plan of Care (Signed)
Problem: Ineffective individual coping Goal: STG: Patient will remain free from self harm Outcome: Progressing Patient denies any thoughts of suicide at present.

## 2014-10-06 NOTE — Progress Notes (Signed)
Patient denies feeling depressed, but does admit to feeling mild anxiety at times; denies anymore hallucinations; has been calm and cooperative throughout the shift; is venturing out of her room more; no voiced complaints; continue to monitor.

## 2014-10-06 NOTE — Progress Notes (Signed)
[facility] H&P Discharge Summary faxed to [facility] for hospital follow

## 2014-10-06 NOTE — Progress Notes (Signed)
Recreation Therapy Notes  INPATIENT RECREATION TR PLAN  Patient Details [name] MRN: [id number] DOB: 09/21/1990 Today's Date: 10/06/2014  Rec Therapy Plan Is patient appropriate for Therapeutic Recreation?: Yes Treatment times per week: At least 3 times a week TR Treatment/Interventions: 1:1 session, Group participation (Comment) (Appropriate participation in daily recreatin therapy tx)  Discharge Criteria Pt will be discharged from therapy if:: Discharged Treatment plan/goals/alternatives discussed and agreed upon by:: Patient/family  Discharge Summary Short term goals set: See Care Plan Short term goals met: Complete Progress toward goals comments: One-to-one attended Which groups?: Self-esteem, Wellness, Communication, Social skills, Leisure education One-to-one attended: Stress Management Reason goals not met: N/A Therapeutic equipment acquired: None Reason patient discharged from therapy: Discharge from hospital Pt/family agrees with progress & goals achieved: Yes Date patient discharged from therapy: 10/06/14   , M, LRT/CTRS 10/06/2014, 11:28 AM  

## 2014-10-06 NOTE — Progress Notes (Signed)
Patient discharged ambulatory to group home manager. Patient denies SI or HI. Discharge instructions reviewed with patient, she verbalizes understanding. Patient received all personal belongings, DC instructions, and prescriptions.
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
## 2014-10-06 NOTE — [facility] Suicide Risk Assessment (Signed)
[facility] Discharge Suicide Risk Assessment   Demographic Factors:  Caucasian  Total Time spent with patient: 30 minutes  Musculoskeletal: Strength & Muscle Tone: within normal limits Gait & Station: normal Patient leans: N/A  Psychiatric Specialty Exam: Physical Exam  ROS  Blood pressure 115/80, pulse 66, temperature 98.9 F (37.2 C), temperature source Oral, resp. rate 20, height 5' 8.5" (1.74 m), weight 95.936 kg (211 lb 8 oz).Body mass index is 31.69 kg/(m^2).                                                       Have you used any form of tobacco in the last 30 days? (Cigarettes, Smokeless Tobacco, Cigars, and/or Pipes): Yes  Has this patient used any form of tobacco in the last 30 days? (Cigarettes, Smokeless Tobacco, Cigars, and/or Pipes) No  Mental Status Per Nursing Assessment::   On Admission:  NA  Current Mental Status by Physician: Denies suicidality, homicidality or having auditory or visual hallucinations  Loss Factors: none  Historical Factors: Prior suicide attempts  Risk Reduction Factors:   Sense of responsibility to family and Positive social support  Continued Clinical Symptoms:  Schizophrenia:   Command hallucinatons  Cognitive Features That Contribute To Risk:  None    Suicide Risk:  Minimal: No identifiable suicidal ideation.  Patients presenting with no risk factors but with morbid ruminations; may be classified as minimal risk based on the severity of the depressive symptoms  Principal Problem: Schizoaffective disorder Discharge Diagnoses:  Patient Active Problem List   Diagnosis Date Noted  . Schizoaffective disorder, depressive type [F25.1]   . Schizoaffective disorder [F25.9] 09/30/2014  . Tobacco use disorder [Z72.0] 09/30/2014  . H/O foreign body ingestion [Z87.821] 09/29/2014  . Hypothyroid [E03.9] 09/29/2014  . Hypertension [I10] 09/29/2014  . Constipation [K59.00] 09/29/2014    Follow-up Information    Follow up with [facility]. Go in 1 week.   Why:  Hospital Discharge Follow up Ms Oswaldo Milian will schedule her follow up appointment within a week.   Contact information:   [place] [phone]      Plan Of Care/Follow-up recommendations:  Other:  f/u with outpatient psychiatrist  Is patient on multiple antipsychotic therapies at discharge:  Yes,   Do you recommend tapering to monotherapy for antipsychotics?  No   Has Patient had three or more failed trials of antipsychotic monotherapy by history:  Yes,   Antipsychotic medications that previously failed include:   1.  Prolixin. and 2.  Latuda.  Recommended Plan for Multiple Antipsychotic Therapies: Additional reason(s) for multiple antispychotic treatment:  Patient has failed multiple antipsychotics. Clozaril has been tried in the past. I do not have records of her prior trial of Clozaril but patient reports decrease in [facility] patient is a stable on current regimen do not recommend to altered his regimen unless the patient becomes symptomatic again at that point Clozaril will need to be reconsidered.    [name] 10/06/2014, 7:36 AM

## 2014-10-15 DIAGNOSIS — G2401 Drug induced subacute dyskinesia: Secondary | ICD-10-CM

## 2014-10-15 HISTORY — DX: Drug induced subacute dyskinesia: G24.01

## 2014-11-04 ENCOUNTER — Encounter: Payer: Self-pay | Admitting: Emergency Medicine

## 2014-11-04 ENCOUNTER — Emergency Department
Admission: EM | Admit: 2014-11-04 | Discharge: 2014-11-05 | Disposition: A | Discharge status: Other Acute Inpatient Hospital | Payer: Medicaid Other | Attending: Emergency Medicine | Admitting: Emergency Medicine

## 2014-11-04 DIAGNOSIS — R440 Auditory hallucinations: Secondary | ICD-10-CM | POA: Diagnosis not present

## 2014-11-04 DIAGNOSIS — Z79899 Other long term (current) drug therapy: Secondary | ICD-10-CM | POA: Diagnosis not present

## 2014-11-04 DIAGNOSIS — R45851 Suicidal ideations: Secondary | ICD-10-CM

## 2014-11-04 DIAGNOSIS — F251 Schizoaffective disorder, depressive type: Secondary | ICD-10-CM | POA: Diagnosis not present

## 2014-11-04 DIAGNOSIS — I10 Essential (primary) hypertension: Secondary | ICD-10-CM | POA: Insufficient documentation

## 2014-11-04 DIAGNOSIS — Z720 Tobacco use: Secondary | ICD-10-CM | POA: Insufficient documentation

## 2014-11-04 DIAGNOSIS — R451 Restlessness and agitation: Secondary | ICD-10-CM | POA: Diagnosis present

## 2014-11-04 DIAGNOSIS — E039 Hypothyroidism, unspecified: Secondary | ICD-10-CM | POA: Diagnosis present

## 2014-11-04 LAB — CBC
HCT: 36 % (ref 35.0–47.0)
Hemoglobin: 11.8 g/dL — ABNORMAL LOW (ref 12.0–16.0)
MCH: 30.5 pg (ref 26.0–34.0)
MCHC: 32.8 g/dL (ref 32.0–36.0)
MCV: 93 fL (ref 80.0–100.0)
Platelets: 212 10*3/uL (ref 150–440)
RBC: 3.87 MIL/uL (ref 3.80–5.20)
RDW: 13.9 % (ref 11.5–14.5)
WBC: 8.8 10*3/uL (ref 3.6–11.0)

## 2014-11-04 LAB — COMPREHENSIVE METABOLIC PANEL
ALBUMIN: 4.1 g/dL (ref 3.5–5.0)
ALT: 8 U/L — AB (ref 14–54)
ANION GAP: 5 (ref 5–15)
AST: 14 U/L — AB (ref 15–41)
Alkaline Phosphatase: 77 U/L (ref 38–126)
BUN: 8 mg/dL (ref 6–20)
CO2: 25 mmol/L (ref 22–32)
Calcium: 9.3 mg/dL (ref 8.9–10.3)
Chloride: 109 mmol/L (ref 101–111)
Creatinine, Ser: 0.61 mg/dL (ref 0.44–1.00)
GFR calc Af Amer: >60 mL/min (ref >60)
GFR calc non Af Amer: >60 mL/min (ref >60)
Glucose, Bld: 86 mg/dL (ref 65–99)
Potassium: 3.7 mmol/L (ref 3.5–5.1)
SODIUM: 139 mmol/L (ref 135–145)
TOTAL PROTEIN: 6.9 g/dL (ref 6.5–8.1)
Total Bilirubin: 0.2 mg/dL — ABNORMAL LOW (ref 0.3–1.2)

## 2014-11-04 LAB — SALICYLATE LEVEL

## 2014-11-04 LAB — TSH: TSH: 1.193 u[IU]/mL (ref 0.350–4.500)

## 2014-11-04 LAB — LITHIUM LEVEL: LITHIUM LVL: 0.52 mmol/L — AB (ref 0.60–1.20)

## 2014-11-04 LAB — ETHANOL: Alcohol, Ethyl (B): <5 mg/dL (ref <5)

## 2014-11-04 LAB — ACETAMINOPHEN LEVEL

## 2014-11-04 MED ORDER — BENZTROPINE MESYLATE 0.5 MG PO TABS
ORAL_TABLET | ORAL | Status: AC
  Filled 2014-11-04: qty 2

## 2014-11-04 MED ORDER — BENZTROPINE MESYLATE 1 MG PO TABS
1.0000 mg | ORAL_TABLET | Freq: Every day | ORAL | Status: DC
  Administered 2014-11-04 – 2014-11-05 (×2): 1 mg via ORAL

## 2014-11-04 MED ORDER — LURASIDONE HCL 40 MG PO TABS
80.0000 mg | ORAL_TABLET | Freq: Every day | ORAL | Status: DC
  Administered 2014-11-05: 80 mg via ORAL
  Filled 2014-11-04: qty 2

## 2014-11-04 MED ORDER — TRAZODONE HCL 50 MG PO TABS
ORAL_TABLET | ORAL | Status: AC
  Filled 2014-11-04: qty 1

## 2014-11-04 MED ORDER — OXCARBAZEPINE 150 MG PO TABS
150.0000 mg | ORAL_TABLET | Freq: Two times a day (BID) | ORAL | Status: DC
  Administered 2014-11-04 – 2014-11-05 (×3): 150 mg via ORAL
  Filled 2014-11-04 (×7): qty 1

## 2014-11-04 MED ORDER — FLUPHENAZINE HCL 5 MG PO TABS
2.5000 mg | ORAL_TABLET | Freq: Three times a day (TID) | ORAL | Status: DC
  Administered 2014-11-04 – 2014-11-05 (×4): 2.5 mg via ORAL
  Filled 2014-11-04 (×4): qty 1

## 2014-11-04 MED ORDER — FLUPHENAZINE HCL 5 MG PO TABS
10.0000 mg | ORAL_TABLET | Freq: Every day | ORAL | Status: DC
  Administered 2014-11-04 – 2014-11-05 (×2): 10 mg via ORAL
  Filled 2014-11-04: qty 1
  Filled 2014-11-04: qty 2
  Filled 2014-11-04: qty 1

## 2014-11-04 MED ORDER — DOCUSATE SODIUM 100 MG PO CAPS
100.0000 mg | ORAL_CAPSULE | Freq: Two times a day (BID) | ORAL | Status: DC
  Administered 2014-11-04 – 2014-11-05 (×3): 100 mg via ORAL

## 2014-11-04 MED ORDER — VENLAFAXINE HCL ER 75 MG PO CP24
150.0000 mg | ORAL_CAPSULE | Freq: Every day | ORAL | Status: DC
  Administered 2014-11-05: 150 mg via ORAL
  Filled 2014-11-04: qty 1
  Filled 2014-11-04: qty 2
  Filled 2014-11-04: qty 1

## 2014-11-04 MED ORDER — TRAZODONE HCL 50 MG PO TABS
50.0000 mg | ORAL_TABLET | Freq: Every day | ORAL | Status: DC
  Administered 2014-11-04 – 2014-11-05 (×2): 50 mg via ORAL

## 2014-11-04 MED ORDER — LEVOTHYROXINE SODIUM 75 MCG PO TABS
75.0000 ug | ORAL_TABLET | Freq: Every day | ORAL | Status: DC
  Administered 2014-11-05: 75 ug via ORAL
  Filled 2014-11-04 (×3): qty 1

## 2014-11-04 MED ORDER — POLYETHYLENE GLYCOL 3350 17 G PO PACK
17.0000 g | PACK | Freq: Every day | ORAL | Status: DC
  Administered 2014-11-04 – 2014-11-05 (×2): 17 g via ORAL
  Filled 2014-11-04 (×3): qty 1

## 2014-11-04 MED ORDER — LISINOPRIL 5 MG PO TABS
5.0000 mg | ORAL_TABLET | Freq: Every day | ORAL | Status: DC
  Administered 2014-11-04 – 2014-11-05 (×2): 5 mg via ORAL
  Filled 2014-11-04 (×2): qty 1

## 2014-11-04 MED ORDER — DOCUSATE SODIUM 100 MG PO CAPS
ORAL_CAPSULE | ORAL | Status: AC
  Filled 2014-11-04: qty 1

## 2014-11-04 NOTE — ED Notes (Signed)
Pt states she was brought in by group home.  Pt states she started hearing voices last night to hurt herself.  Pt denies drugs or alcohol use.  Pt alert and calm.  Pt in hallway bed.

## 2014-11-04 NOTE — ED Notes (Signed)

## 2014-11-04 NOTE — ED Notes (Signed)
Dr clapacs with pt now.

## 2014-11-04 NOTE — ED Provider Notes (Signed)
[facility] Emergency Department Provider Note  ____________________________________________  Time seen: Approximately 3:29 PM  I have reviewed the triage vital signs and the nursing notes.   HISTORY  Chief Complaint Agitation    HPI Melanie Bell is a 24 y.o. female with a history of hallucinations and schizoaffective disorder who presents today with hallucinations, auditory, telling her to kill her self. The patient says that these hallucinations have been going on since last night. She denies any provoking factors such as stress, lack of sleep or not taking her medications. She does not have a plan and has not attempted to hurt herself. She denies any ingestion. She was sent in by her group home today.   Past Medical History  Diagnosis Date  . Hallucinations 09/30/2014    Sizoaffective  . Depression   . Anxiety   . Hypertension     Patient Active Problem List   Diagnosis Date Noted  . Tardive dyskinesia 10/06/2014  . Schizoaffective disorder, depressive type   . Tobacco use disorder 09/30/2014  . H/O foreign body ingestion 09/29/2014  . Hypothyroid 09/29/2014  . Hypertension 09/29/2014  . Constipation 09/29/2014    Past Surgical History  Procedure Laterality Date  . Breast lumpectomy    . Wisdom tooth extraction      Current Outpatient Rx  Name  Route  Sig  Dispense  Refill  . benztropine (COGENTIN) 1 MG tablet   Oral   Take 1 tablet (1 mg total) by mouth at bedtime.   30 tablet   0   . docusate sodium (COLACE) 100 MG capsule   Oral   Take 2 capsules (200 mg total) by mouth 2 (two) times daily.   120 capsule   0   . fluPHENAZine (PROLIXIN) 10 MG tablet   Oral   Take 1 tablet (10 mg total) by mouth at bedtime.   30 tablet   0   . fluPHENAZine (PROLIXIN) 5 MG tablet   Oral   Take 1 tablet (5 mg total) by mouth 2 (two) times daily.   60 tablet   0   . levothyroxine (SYNTHROID, LEVOTHROID) 75 MCG tablet   Oral   Take 1  tablet (75 mcg total) by mouth daily before breakfast.   30 tablet   0   . lithium carbonate (LITHOBID) 300 MG CR tablet   Oral   Take 1 tablet (300 mg total) by mouth every morning.   30 tablet   0   . lithium carbonate (LITHOBID) 300 MG CR tablet   Oral   Take 2 tablets (600 mg total) by mouth at bedtime.   60 tablet   0   . lurasidone (LATUDA) 80 MG TABS tablet   Oral   Take 1 tablet (80 mg total) by mouth daily with supper.   30 tablet   0   . OXcarbazepine (TRILEPTAL) 150 MG tablet   Oral   Take 1 tablet (150 mg total) by mouth 2 (two) times daily.   60 tablet   0   . polyethylene glycol (MIRALAX / GLYCOLAX) packet   Oral   Take 17 g by mouth daily as needed for moderate constipation.   14 each   0   . senna (SENOKOT) 8.6 MG TABS tablet   Oral   Take 2 tablets (17.2 mg total) by mouth at bedtime.   60 each   0   . traZODone (DESYREL) 50 MG tablet   Oral   Take  1 tablet (50 mg total) by mouth at bedtime as needed for sleep.   30 tablet   0   . venlafaxine XR (EFFEXOR-XR) 150 MG 24 hr capsule   Oral   Take 1 capsule (150 mg total) by mouth daily with breakfast.   30 capsule   0     Allergies Betadine and Iodine  Family History  Problem Relation Age of Onset  . Depression Mother   . Hypertension Mother     Social History History  Substance Use Topics  . Smoking status: Current Every Day Smoker  . Smokeless tobacco: Not on file  . Alcohol Use: No    Review of Systems Constitutional: No fever/chills Eyes: No visual changes. ENT: No sore throat. Cardiovascular: Denies chest pain. Respiratory: Denies shortness of breath. Gastrointestinal: No abdominal pain.  No nausea, no vomiting.  No diarrhea.  No constipation. Genitourinary: Negative for dysuria. Musculoskeletal: Negative for back pain. Skin: Negative for rash. Neurological: Negative for headaches, focal weakness or numbness. Psychiatric:Auditory hallucinations with suicidal  ideation.  10-point ROS otherwise negative.  ____________________________________________   PHYSICAL EXAM:  VITAL SIGNS: ED Triage Vitals  Enc Vitals Group     BP 11/04/14 1455 123/79 mmHg     Pulse Rate 11/04/14 1455 78     Resp --      Temp 11/04/14 1455 98.4 F (36.9 C)     Temp Source 11/04/14 1455 Oral     SpO2 11/04/14 1455 98 %     Weight 11/04/14 1455 218 lb (98.884 kg)     Height 11/04/14 1455 5\' 6"  (1.676 m)     Head Cir --      Peak Flow --      Pain Score --      Pain Loc --      Pain Edu? --      Excl. in [place]? --     Constitutional: Alert and oriented. Well appearing and in no acute distress. Eyes: Conjunctivae are normal. PERRL. EOMI. Head: Atraumatic. Nose: No congestion/rhinnorhea. Mouth/Throat: Mucous membranes are moist.  Oropharynx non-erythematous. Neck: No stridor.   Cardiovascular: Normal rate, regular rhythm. Grossly normal heart sounds.  Good peripheral circulation. Respiratory: Normal respiratory effort.  No retractions. Lungs CTAB. Gastrointestinal: Soft and nontender. No distention. No abdominal bruits. No CVA tenderness. Musculoskeletal: No lower extremity tenderness nor edema.  No joint effusions. Neurologic:  Normal speech and language. No gross focal neurologic deficits are appreciated. Speech is normal. No gait instability. Skin:  Skin is warm, dry and intact. No rash noted. Psychiatric: Flat affect. Not overtly responding to internal stimuli. Positive for suicidal ideation. ____________________________________________   LABS (all labs ordered are listed, but only abnormal results are displayed)  Labs Reviewed  ACETAMINOPHEN LEVEL - Abnormal; Notable for the following:    Acetaminophen (Tylenol), Serum <10 (*)    All other components within normal limits  CBC - Abnormal; Notable for the following:    Hemoglobin 11.8 (*)    All other components within normal limits  COMPREHENSIVE METABOLIC PANEL - Abnormal; Notable for the following:     AST 14 (*)    ALT 8 (*)    Total Bilirubin 0.2 (*)    All other components within normal limits  LITHIUM LEVEL - Abnormal; Notable for the following:    Lithium Lvl 0.52 (*)    All other components within normal limits  ETHANOL  SALICYLATE LEVEL  TSH   ____________________________________________  EKG   ____________________________________________  RADIOLOGY  ____________________________________________   PROCEDURES    ____________________________________________   INITIAL IMPRESSION / ASSESSMENT AND PLAN / ED COURSE  Pertinent labs & imaging results that were available during my care of the patient were reviewed by me and considered in my medical decision making (see chart for details).  We'll complete involuntary commitment paperwork. Pending labs. We'll have psychiatry see has consult. ____________________________________________   FINAL CLINICAL IMPRESSION(S) / ED DIAGNOSES  Acute auditory hallucinations. Acute suicidal ideation. Initial visit.    Orbie Pyo, MD 11/04/14 [phone]

## 2014-11-04 NOTE — ED Notes (Signed)
BEHAVIORAL HEALTH ROUNDING Patient sleeping: Patient alert and oriented: yes Behavior appropriate: Yes.  ; If no, describe:  Nutrition and fluids offered: Yes  Toileting and hygiene offered: Yes  Sitter present: yes Law enforcement present: Yes

## 2014-11-04 NOTE — ED Notes (Signed)
BEHAVIORAL HEALTH ROUNDING Patient sleeping: No. Patient alert and oriented: yes Behavior appropriate: Yes.  ; If no, describe:  Nutrition and fluids offered: Yes  Toileting and hygiene offered: Yes  Sitter present: yes Law enforcement present: Yes  
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
## 2014-11-04 NOTE — ED Notes (Signed)
BEHAVIORAL HEALTH ROUNDING Patient sleeping: Yes.   Patient alert and oriented: yes Behavior appropriate: Yes.  ; If no, describe:  Nutrition and fluids offered: Yes  Toileting and hygiene offered: Yes  Sitter present: yes Law enforcement present: Yes  

## 2014-11-04 NOTE — ED Notes (Signed)
Brought in from [facility] home hearing voices and wants to harm self

## 2014-11-05 ENCOUNTER — Inpatient Hospital Stay
Admit: 2014-11-05 | Discharge: 2014-11-10 | DRG: 885 | Disposition: A | Discharge status: Home / Self Care | Payer: MEDICAID | Attending: Psychiatry | Admitting: Psychiatry

## 2014-11-05 DIAGNOSIS — Z62810 Personal history of physical and sexual abuse in childhood: Secondary | ICD-10-CM | POA: Diagnosis present

## 2014-11-05 DIAGNOSIS — T184XXA Foreign body in colon, initial encounter: Secondary | ICD-10-CM

## 2014-11-05 DIAGNOSIS — F17210 Nicotine dependence, cigarettes, uncomplicated: Secondary | ICD-10-CM | POA: Diagnosis present

## 2014-11-05 DIAGNOSIS — Z888 Allergy status to other drugs, medicaments and biological substances status: Secondary | ICD-10-CM

## 2014-11-05 DIAGNOSIS — K5900 Constipation, unspecified: Secondary | ICD-10-CM | POA: Diagnosis present

## 2014-11-05 DIAGNOSIS — Z79899 Other long term (current) drug therapy: Secondary | ICD-10-CM | POA: Diagnosis not present

## 2014-11-05 DIAGNOSIS — Z818 Family history of other mental and behavioral disorders: Secondary | ICD-10-CM | POA: Diagnosis not present

## 2014-11-05 DIAGNOSIS — R440 Auditory hallucinations: Secondary | ICD-10-CM | POA: Diagnosis not present

## 2014-11-05 DIAGNOSIS — I10 Essential (primary) hypertension: Secondary | ICD-10-CM | POA: Diagnosis present

## 2014-11-05 DIAGNOSIS — R45851 Suicidal ideations: Secondary | ICD-10-CM | POA: Diagnosis present

## 2014-11-05 DIAGNOSIS — F250 Schizoaffective disorder, bipolar type: Secondary | ICD-10-CM | POA: Diagnosis present

## 2014-11-05 DIAGNOSIS — K219 Gastro-esophageal reflux disease without esophagitis: Secondary | ICD-10-CM | POA: Diagnosis present

## 2014-11-05 DIAGNOSIS — E039 Hypothyroidism, unspecified: Secondary | ICD-10-CM | POA: Diagnosis present

## 2014-11-05 DIAGNOSIS — G4700 Insomnia, unspecified: Secondary | ICD-10-CM | POA: Diagnosis present

## 2014-11-05 DIAGNOSIS — G2401 Drug induced subacute dyskinesia: Secondary | ICD-10-CM | POA: Diagnosis present

## 2014-11-05 DIAGNOSIS — F419 Anxiety disorder, unspecified: Secondary | ICD-10-CM | POA: Diagnosis present

## 2014-11-05 DIAGNOSIS — Z8249 Family history of ischemic heart disease and other diseases of the circulatory system: Secondary | ICD-10-CM | POA: Diagnosis not present

## 2014-11-05 DIAGNOSIS — Z87821 Personal history of retained foreign body fully removed: Secondary | ICD-10-CM

## 2014-11-05 DIAGNOSIS — F251 Schizoaffective disorder, depressive type: Secondary | ICD-10-CM | POA: Diagnosis not present

## 2014-11-05 HISTORY — DX: Drug induced subacute dyskinesia: G24.01

## 2014-11-05 HISTORY — DX: Gastro-esophageal reflux disease without esophagitis: K21.9

## 2014-11-05 MED ORDER — LURASIDONE HCL 40 MG PO TABS
ORAL_TABLET | ORAL | Status: AC
  Administered 2014-11-05: 80 mg via ORAL
  Filled 2014-11-05: qty 2

## 2014-11-05 MED ORDER — FLUPHENAZINE HCL 5 MG PO TABS
ORAL_TABLET | ORAL | Status: AC
  Administered 2014-11-05: 2.5 mg via ORAL
  Filled 2014-11-05: qty 1

## 2014-11-05 MED ORDER — FLUPHENAZINE HCL 5 MG PO TABS
ORAL_TABLET | ORAL | Status: AC
  Filled 2014-11-05: qty 2

## 2014-11-05 MED ORDER — DOCUSATE SODIUM 100 MG PO CAPS
ORAL_CAPSULE | ORAL | Status: AC
  Administered 2014-11-05: 100 mg via ORAL
  Filled 2014-11-05: qty 1

## 2014-11-05 MED ORDER — TRAZODONE HCL 50 MG PO TABS
ORAL_TABLET | ORAL | Status: AC
  Administered 2014-11-05: 50 mg via ORAL
  Filled 2014-11-05: qty 1

## 2014-11-05 MED ORDER — FLUPHENAZINE HCL 5 MG PO TABS
ORAL_TABLET | ORAL | Status: AC
  Filled 2014-11-05: qty 1

## 2014-11-05 MED ORDER — BENZTROPINE MESYLATE 1 MG PO TABS
ORAL_TABLET | ORAL | Status: AC
  Administered 2014-11-05: 1 mg via ORAL
  Filled 2014-11-05: qty 1

## 2014-11-05 NOTE — ED Notes (Signed)
BEHAVIORAL HEALTH ROUNDING Patient sleeping: Yes.   Patient alert and oriented: yes Behavior appropriate: Yes.  ; If no, describe:  Nutrition and fluids offered: Yes  Toileting and hygiene offered: Yes  Sitter present: yes Law enforcement present: Yes  

## 2014-11-05 NOTE — ED Notes (Signed)
BEHAVIORAL HEALTH ROUNDING Patient sleeping: No. Patient alert and oriented: yes Behavior appropriate: Yes.  ;  Nutrition and fluids offered: Yes  Toileting and hygiene offered: Yes  Sitter present: yes Law enforcement present: Yes  

## 2014-11-05 NOTE — ED Notes (Signed)

## 2014-11-05 NOTE — ED Notes (Signed)
BEHAVIORAL HEALTH ROUNDING Patient sleeping: YES Patient alert and oriented: Sleeping Behavior appropriate: Sleeping Describe behavior: No inappropriate or unacceptable behaviors noted at this time.  Nutrition and fluids offered: Sleeping Toileting and hygiene offered: Sleeping Sitter present: Behavioral tech rounding every 15 minutes on patient to ensure safety.  Law enforcement present: YES Law enforcement agency: [employer] ([employer] 
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
## 2014-11-05 NOTE — ED Notes (Signed)
Pt to room number 4 in [place]. Pt oriented to area. Pt to shower at this time.

## 2014-11-05 NOTE — ED Notes (Signed)
Report called to [facility] RN in beh med pt admitted per md order

## 2014-11-05 NOTE — ED Notes (Signed)
Pt sleeping in hallway bed    siderails up x 2

## 2014-11-05 NOTE — ED Notes (Signed)
BEHAVIORAL HEALTH ROUNDING Patient sleeping: No. Patient alert and oriented: yes Behavior appropriate: Yes.  ; If no, describe:  Nutrition and fluids offered: Yes  Toileting and hygiene offered: Yes  Sitter present: yes Law enforcement present: Yes  

## 2014-11-05 NOTE — ED Notes (Signed)
BEHAVIORAL HEALTH ROUNDING Patient sleeping: no Patient alert and oriented: yes Behavior appropriate: Yes.  ; Nutrition and fluids offered: Yes  Toileting and hygiene offered: Yes  Sitter present: not applicable Law enforcement present: Yes

## 2014-11-05 NOTE — ED Notes (Signed)
Resumed care from [facility].  Pt in bed resting     Pt continues to hear voices.  Pt calm and cooperative.
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
## 2014-11-05 NOTE — ED Notes (Signed)
Patient resting on stretched in 19H with NAD noted. Sleeping at this time - even and non-labored respirations noted. Patient has not had any erratic or inappropriate behavior since this RN assumed care of patient. No needs identified. Will continue to monitor.

## 2014-11-05 NOTE — ED Notes (Signed)
BEHAVIORAL HEALTH ROUNDING Patient sleeping: Yes.   Patient alert and oriented: not applicable Behavior appropriate: Yes.  ; If no, describe:  Nutrition and fluids offered: Yes  Toileting and hygiene offered: Yes  Sitter present: yes Law enforcement present: Yes ODS 

## 2014-11-05 NOTE — ED Notes (Signed)
Pt [name], MD at bedside at this time.

## 2014-11-05 NOTE — ED Notes (Signed)
BEHAVIORAL HEALTH ROUNDING Patient sleeping: YES Patient alert and oriented: Sleeping Behavior appropriate: Sleeping Describe behavior: No inappropriate or unacceptable behaviors noted at this time.  Nutrition and fluids offered: Sleeping Toileting and hygiene offered: Sleeping Sitter present: Behavioral tech rounding every 15 minutes on patient to ensure safety.  Law enforcement present: Pharmacologist

## 2014-11-05 NOTE — ED Notes (Signed)
Pt eating lunch at this time

## 2014-11-05 NOTE — ED Provider Notes (Signed)
-----------------------------------------   5:44 AM on 11/05/2014 -----------------------------------------   BP 123/79 mmHg  Pulse 78  Temp(Src) 98.4 F (36.9 C) (Oral)  Ht 5\' 6"  (1.676 m)  Wt 218 lb (98.884 kg)  BMI 35.20 kg/m2  SpO2 98%  The patient had no acute events since last update.  Calm and cooperative at this time.  Disposition is pending per Psychiatry/Behavioral Medicine team recommendations.     Paulette Blanch, MD 11/05/14 [phone]

## 2014-11-05 NOTE — ED Provider Notes (Signed)
-----------------------------------------   11:26 PM on 11/05/2014 -----------------------------------------   BP 108/65 mmHg  Pulse 76  Temp(Src) 98.5 F (36.9 C) (Oral)  Resp 20  Ht 5\' 6"  (1.676 m)  Wt 218 lb (98.884 kg)  BMI 35.20 kg/m2  SpO2 98%  The patient had no acute events since last update.  Calm and cooperative at this time.  She has been accepted for admission to inpatient psychiatry.     Ruby Cola, MD 11/05/14 [phone]

## 2014-11-06 ENCOUNTER — Encounter: Payer: Self-pay | Admitting: Behavioral Health

## 2014-11-06 ENCOUNTER — Inpatient Hospital Stay: Payer: MEDICAID

## 2014-11-06 DIAGNOSIS — F250 Schizoaffective disorder, bipolar type: Secondary | ICD-10-CM | POA: Diagnosis present

## 2014-11-06 LAB — LIPID PANEL
CHOLESTEROL: 175 mg/dL (ref 0–200)
HDL: 46 mg/dL (ref >40)
LDL CALC: 114 mg/dL — AB (ref 0–99)
TRIGLYCERIDES: 74 mg/dL (ref <150)
Total CHOL/HDL Ratio: 3.8 RATIO
VLDL: 15 mg/dL (ref 0–40)

## 2014-11-06 MED ORDER — FLUPHENAZINE HCL 5 MG PO TABS
10.0000 mg | ORAL_TABLET | Freq: Every day | ORAL | Status: DC
Start: 2014-11-07 — End: 2014-11-10
  Administered 2014-11-07 – 2014-11-09 (×3): 10 mg via ORAL
  Filled 2014-11-06 (×3): qty 2

## 2014-11-06 MED ORDER — LISINOPRIL 5 MG PO TABS
5.0000 mg | ORAL_TABLET | Freq: Every day | ORAL | Status: DC
  Administered 2014-11-06: 5 mg via ORAL
  Filled 2014-11-06: qty 1

## 2014-11-06 MED ORDER — DOCUSATE SODIUM 100 MG PO CAPS
100.0000 mg | ORAL_CAPSULE | Freq: Two times a day (BID) | ORAL | Status: DC
  Administered 2014-11-06: 100 mg via ORAL
  Filled 2014-11-06: qty 1

## 2014-11-06 MED ORDER — ACETAMINOPHEN 325 MG PO TABS
650.0000 mg | ORAL_TABLET | Freq: Four times a day (QID) | ORAL | Status: DC | PRN
  Administered 2014-11-07 – 2014-11-08 (×3): 650 mg via ORAL
  Filled 2014-11-06 (×3): qty 2

## 2014-11-06 MED ORDER — LEVOTHYROXINE SODIUM 75 MCG PO TABS
75.0000 ug | ORAL_TABLET | Freq: Every day | ORAL | Status: DC
  Administered 2014-11-06 – 2014-11-10 (×5): 75 ug via ORAL
  Filled 2014-11-06 (×5): qty 1

## 2014-11-06 MED ORDER — NICOTINE 10 MG IN INHA
1.0000 | RESPIRATORY_TRACT | Status: DC | PRN
  Filled 2014-11-06: qty 36

## 2014-11-06 MED ORDER — TRAZODONE HCL 50 MG PO TABS
50.0000 mg | ORAL_TABLET | Freq: Every day | ORAL | Status: DC
  Administered 2014-11-06 – 2014-11-09 (×4): 50 mg via ORAL
  Filled 2014-11-06 (×4): qty 1

## 2014-11-06 MED ORDER — NICOTINE 21 MG/24HR TD PT24: 21.0000 mg | MEDICATED_PATCH | Freq: Every day | TRANSDERMAL | Status: DC

## 2014-11-06 MED ORDER — ALUM & MAG HYDROXIDE-SIMETH 200-200-20 MG/5ML PO SUSP: 30.0000 mL | ORAL | Status: DC | PRN

## 2014-11-06 MED ORDER — MAGNESIUM HYDROXIDE 400 MG/5ML PO SUSP: 30.0000 mL | Freq: Every day | ORAL | Status: DC | PRN

## 2014-11-06 MED ORDER — SENNA 8.6 MG PO TABS
2.0000 | ORAL_TABLET | Freq: Every day | ORAL | Status: DC
  Administered 2014-11-06 – 2014-11-09 (×4): 17.2 mg via ORAL
  Filled 2014-11-06 (×4): qty 2

## 2014-11-06 MED ORDER — FLUPHENAZINE HCL 5 MG PO TABS
2.5000 mg | ORAL_TABLET | Freq: Three times a day (TID) | ORAL | Status: DC
  Administered 2014-11-06 (×2): 2.5 mg via ORAL
  Filled 2014-11-06 (×2): qty 1

## 2014-11-06 MED ORDER — LITHIUM CARBONATE ER 300 MG PO TBCR
300.0000 mg | EXTENDED_RELEASE_TABLET | Freq: Every day | ORAL | Status: DC
  Administered 2014-11-06 – 2014-11-10 (×5): 300 mg via ORAL
  Filled 2014-11-06 (×5): qty 1

## 2014-11-06 MED ORDER — BENZTROPINE MESYLATE 1 MG PO TABS
1.0000 mg | ORAL_TABLET | Freq: Every day | ORAL | Status: DC
  Administered 2014-11-06 – 2014-11-09 (×4): 1 mg via ORAL
  Filled 2014-11-06 (×4): qty 1

## 2014-11-06 MED ORDER — VENLAFAXINE HCL ER 75 MG PO CP24
150.0000 mg | ORAL_CAPSULE | Freq: Every day | ORAL | Status: DC
  Administered 2014-11-06 – 2014-11-10 (×5): 150 mg via ORAL
  Filled 2014-11-06 (×5): qty 2

## 2014-11-06 MED ORDER — LITHIUM CARBONATE ER 300 MG PO TBCR
600.0000 mg | EXTENDED_RELEASE_TABLET | Freq: Every day | ORAL | Status: DC
  Administered 2014-11-06 – 2014-11-09 (×4): 600 mg via ORAL
  Filled 2014-11-06 (×4): qty 2

## 2014-11-06 MED ORDER — OXCARBAZEPINE 150 MG PO TABS
150.0000 mg | ORAL_TABLET | Freq: Two times a day (BID) | ORAL | Status: DC
  Administered 2014-11-06 – 2014-11-10 (×8): 150 mg via ORAL
  Filled 2014-11-06 (×11): qty 1

## 2014-11-06 MED ORDER — POLYETHYLENE GLYCOL 3350 17 G PO PACK
17.0000 g | PACK | Freq: Every day | ORAL | Status: DC
  Administered 2014-11-06: 17 g via ORAL
  Filled 2014-11-06 (×3): qty 1

## 2014-11-06 MED ORDER — DOCUSATE SODIUM 100 MG PO CAPS
200.0000 mg | ORAL_CAPSULE | Freq: Two times a day (BID) | ORAL | Status: DC
  Administered 2014-11-06 – 2014-11-10 (×8): 200 mg via ORAL
  Filled 2014-11-06 (×8): qty 2

## 2014-11-06 MED ORDER — LURASIDONE HCL 80 MG PO TABS
80.0000 mg | ORAL_TABLET | Freq: Every day | ORAL | Status: DC
  Administered 2014-11-06 – 2014-11-09 (×4): 80 mg via ORAL
  Filled 2014-11-06 (×4): qty 1
  Filled 2014-11-06: qty 2

## 2014-11-06 MED ORDER — FLUPHENAZINE HCL 5 MG PO TABS
5.0000 mg | ORAL_TABLET | Freq: Two times a day (BID) | ORAL | Status: DC
  Administered 2014-11-07 – 2014-11-10 (×8): 5 mg via ORAL
  Filled 2014-11-06 (×8): qty 1

## 2014-11-06 NOTE — [facility] Group Notes (Signed)
[place] Group Notes:  (Nursing/MHT/Case Management/Adjunct)  Date:  11/06/2014  Time:  3:57 AM  Type of Therapy:  Psychoeducational Skills  Participation Level:  Did Not Attend   Summary of Progress/Problems:  Kathi Ludwig 11/06/2014, 3:57 AM
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
## 2014-11-06 NOTE — Progress Notes (Signed)
Recreation Therapy Notes  Date: 06.23.16 Time: 3:00 pm Location: Craft Room  Group Topic: Leisure Education  Goal Area(s) Addresses:  Patient will identify one positive leisure activity.  Behavioral Response: Attentive  Intervention: Leisure Time  Activity: Patients were instructed to write down one positive leisure activity. Patients were instructed to completed "Leisure Time Clock" worksheet. Patients were instructed to list 10 positive emotions as a group. Patients were instructed to match the leisure activities with the emotions.   Education: LRT educated patient on what was needed to participate in leisure.   Education Outcome: Acknowledges education/In group clarification offered  Clinical Observations/Feedback: Patient wrote one positive leisure activity and started working on the "Leisure Time Chiropractor. Patient left group at approximately 3:14 pm with Dr. Jerilee Hoh. Patient returned to group at approximately 3:35 pm. Patient did not contribute to group discussion.  Leonette Monarch, LRT/CTRS 11/06/2014 4:23 PM

## 2014-11-06 NOTE — Progress Notes (Signed)
Patient ID: KATALIA CHOMA, female   DOB: [birth year], 24 y.o.   MRN: [id number] This is a 24 year old female admitted with thoughts of self harm/suicide due to auditory hallucinations which instruct her to kill herself. Pt was recently discharged back to her group home from [place] after tx for a suicide/overdose attempt. Pt cannot identify particular stressors on admission. She does endorse SI, but also contracts for safety. Pt has a hx of swallowing foreign objects and pt reports having had two abdominal surgeries in the past for this reason. Pt mood is depressed and her affect is flat, but she is pleasant and cooperative with staff. Pt also has a hx of tardive dyskinesia, primarily around her lips and tongue, although she ambulates without difficulty. No paraphernalia was found in belongings, and her skin is unremarkable. Writer oriented pt to the milieu and 15 minute checks initiated for safety.

## 2014-11-06 NOTE — Plan of Care (Signed)
Problem: Ineffective individual coping Goal: STG: Pt will be able to identify effective and ineffective STG: Pt will be able to identify effective and ineffective coping patterns  Outcome: Progressing Pt has been attending groups.

## 2014-11-06 NOTE — [facility] Group Notes (Signed)
[facility] Group Notes:  (Nursing/MHT/Case Management/Adjunct)  Date:  11/06/2014  Time:  3:45 PM  Type of Therapy:  Movement Therapy  Participation Level:  Active  Participation Quality:  Appropriate, Attentive and Sharing  Affect:  Appropriate  Cognitive:  Alert, Appropriate and Oriented  Insight:  Appropriate  Engagement in Group:  Engaged  Modes of Intervention:  Activity  Summary of Progress/Problems:  Fionnuala Hemmerich De'Chelle Linzy Laury 11/06/2014, 3:45 PM

## 2014-11-06 NOTE — [facility] Suicide Risk Assessment (Signed)
[facility] Admission Suicide Risk Assessment   Nursing information obtained from:  Patient Demographic factors:  Caucasian, Adolescent or young adult, Low socioeconomic status, Unemployed Current Mental Status:  Suicidal ideation indicated by patient Loss Factors:  NA Historical Factors:  Prior suicide attempts Risk Reduction Factors:  Living with another person, especially a relative, Positive therapeutic relationship Total Time spent with patient: 1 hour Principal Problem: Schizoaffective disorder, bipolar type Diagnosis:   Patient Active Problem List   Diagnosis Date Noted  . Schizoaffective disorder, bipolar type [F25.0] 11/06/2014  . Tardive dyskinesia [G24.01] 10/06/2014  . Tobacco use disorder [Z72.0] 09/30/2014  . H/O foreign body ingestion [Z87.821] 09/29/2014  . Hypothyroid [E03.9] 09/29/2014  . Hypertension [I10] 09/29/2014  . Constipation [K59.00] 09/29/2014     Continued Clinical Symptoms:  Alcohol Use Disorder Identification Test Final Score (AUDIT): 0 The "Alcohol Use Disorders Identification Test", Guidelines for Use in Primary Care, Second Edition.  World Pharmacologist [facility]). Score between 0-7:  no or low risk or alcohol related problems. Score between 8-15:  moderate risk of alcohol related problems. Score between 16-19:  high risk of alcohol related problems. Score 20 or above:  warrants further diagnostic evaluation for alcohol dependence and treatment.   CLINICAL FACTORS:   Depression:   Impulsivity Currently Psychotic Previous Psychiatric Diagnoses and Treatments Medical Diagnoses and Treatments/Surgeries   Psychiatric Specialty Exam: Physical Exam  ROS    COGNITIVE FEATURES THAT CONTRIBUTE TO RISK:  Closed-mindedness    SUICIDE RISK:   Moderate:  Frequent suicidal ideation with limited intensity, and duration, some specificity in terms of plans, no associated intent, good self-control, limited dysphoria/symptomatology, some risk factors present,  and identifiable protective factors, including available and accessible social support.  PLAN OF CARE: admit to [place] Making:  Established Problem, Stable/Improving (1)  I certify that inpatient services furnished can reasonably be expected to improve the patient's condition.   [name] 11/06/2014, 4:24 PM

## 2014-11-06 NOTE — Progress Notes (Signed)
Recreation Therapy Notes  INPATIENT RECREATION THERAPY ASSESSMENT  Patient Details [name] MRN: [id number] DOB: [birth year] Today's Date: 11/06/2014  Patient Stressors: Other (Comment) (Not being able to see her mom as mom is in [place].)  Coping Skills:   Isolate, Avoidance, Exercise, Art/Dance, Talking, Music, Other (Comment) (Journal)  Personal Challenges: Communication, Concentration, Decision-Making, Expressing Yourself, Problem-Solving, Relationships, Stress Management, Time Management, Trusting Others  Leisure Interests (2+):  Individual - Other (Comment) (Taking care of animals, art)  Awareness of Community Resources:  Yes  Community Resources:  Other (Comment) ([employer] (dancing and bingo))  Current Use: Yes  If no, Barriers?:    Patient Strengths:  Beautiful, helpful  Patient Identified Areas of Improvement:  Self-esteem, communication  Current Recreation Participation:  Playing with puppies  Patient Goal for Hospitalization:  Manage stress so she doesn't hear voices  [place] of Residence:  [place] of Residence:  [place]   Current SI (including self-harm):  No  Current HI:  No  Consent to Intern Participation: N/A   Leonette Monarch, LRT/CTRS 11/06/2014, 4:46 PM

## 2014-11-06 NOTE — [facility] Group Notes (Signed)
[place] Group Notes:  (Nursing/MHT/Case Management/Adjunct)  Date:  11/06/2014  Time:  9:05 AM  Type of Therapy:  Community Meeting   Participation Level:  Active  Participation Quality:  Appropriate and Attentive  Affect:  Appropriate  Cognitive:  Alert, Appropriate and Oriented  Insight:  Appropriate  Engagement in Group:  Engaged  Modes of Intervention:  Discussion  Summary of Progress/Problems:  Carrine Kroboth De'Chelle Blondie Riggsbee 11/06/2014, 9:05 AM

## 2014-11-06 NOTE — Tx Team (Signed)
Initial Interdisciplinary Treatment Plan   PATIENT STRESSORS: Auditory Hallucinations   PATIENT STRENGTHS: Ability for insight Average or above average intelligence Communication skills Motivation for treatment/growth Physical Health Supportive family/friends   PROBLEM LIST: Problem List/Patient Goals Date to be addressed Date deferred Reason deferred Estimated date of resolution  Psychosis/Auditory Hallucinations 11/06/2014   11/13/2014  Suicidality 11/06/2014   11/13/2014  Depression 11/06/2014   11/13/2014                                       DISCHARGE CRITERIA:  Improved stabilization in mood, thinking, and/or behavior Motivation to continue treatment in a less acute level of care  PRELIMINARY DISCHARGE PLAN: Outpatient therapy Return to previous living arrangement  PATIENT/FAMIILY INVOLVEMENT: This treatment plan has been presented to and reviewed with the patient, Melanie Bell, and/or family member.  The patient and family have been given the opportunity to ask questions and make suggestions.  [name] [name] 11/06/2014, 12:54 AM

## 2014-11-06 NOTE — Progress Notes (Signed)
Pt continues to endorse auditory hallucinations. States voices are telling her to "kill herself." Pt has been med and group compliant.   Encouraged pt to come to staff with voices, and verbalize feelings.   Pt verbalized understanding and verbally contracts for safety. Pt remains safe on unit with q15 min rounds.

## 2014-11-06 NOTE — Tx Team (Signed)
Interdisciplinary Treatment Plan Update (Adult)  Date:  11/06/2014 Time Reviewed:  4:38 PM  Progress in Treatment: Attending groups: Yes. Participating in groups:  Yes. Taking medication as prescribed:  Yes. Tolerating medication:  Yes. Family/Significant othe contact made:  No, will contact:  when permitted Patient understands diagnosis:  Yes. Discussing patient identified problems/goals with staff:  Yes. Medical problems stabilized or resolved:  Yes. Denies suicidal/homicidal ideation: No. Issues/concerns per patient self-inventory:  Yes. Other:  New problem(s) identified: No, Describe:     Discharge Plan or Barriers: Return to group home, Follow up with [facility] in [place], [place].  Reason for Continuation of Hospitalization: Depression Hallucinations Other; describe Command hallucinations to self-harm  Plan to begin Clozaril with approval of Guardian.  Comments:The patient is a 24 year old single Caucasian female from [place]. This patient carries a diagnosis of schizoaffective disorder. She lives in [facility] group home and is under the guardianship of her mother, Melanie Bell. Patient presented to our emergency Department complaining of hearing voices commanding her to kill herself. Patient was observed in the emergency department for approximately 2 days and she kept reporting having hallucinations and thoughts about killing herself and swallowing objects. Today she was interviewed she reports still having auditory hallucinations that are commanding her to kill herself. These hallucinations started about 3 days ago. She says that the voices have decrease in intensity and she can only hear them mumbling. She denies any desire of wanting to hurt herself or swallow objects, she denies homicidality or having any other type of hallucinations. She reports she has been compliant with medications since discharge. She denies the use of any illicit  substances since discharge. As far as his stressors she initially denied having any stressors that could have brought the hallucinations but later on mentioned that this month was stated third year anniversary of her grandmother's death. During assessment patient became tearful and said that she would like to put flowers on her grade however she was unable to make it to carry [place] where she was buried.   Estimated length of stay:7 days  New goal(s):  Review of initial/current patient goals per problem list:   SEE PLAN OF CARE  Attendees: Patient:  Melanie Bell 6/23/20164:38 PM  Family:   6/23/20164:38 PM  Physician:  Merlyn Albert, MD 6/23/20164:38 PM  Nursing:   Floyde Parkins, RN 6/23/20164:38 PM  Case Manager:   6/23/20164:38 PM  Counselor:   6/23/20164:38 PM  Other:  Dossie Arbour, LCSW 6/23/20164:38 PM  Other:  Everitt Amber, LRT 6/23/20164:38 PM  Other:  Bonnye Fava, [place] 6/23/20164:38 PM  Other:  6/23/20164:38 PM  Other:  6/23/20164:38 PM  Other:  6/23/20164:38 PM  Other:  6/23/20164:38 PM  Other:  6/23/20164:38 PM  Other:  6/23/20164:38 PM  Other:   6/23/20164:38 PM   Scribe for Treatment Team:   Dossie Arbour P,MSW, LCSW 11/06/2014, 4:38 PM

## 2014-11-07 LAB — HEMOGLOBIN A1C: HEMOGLOBIN A1C: 5 % (ref 4.0–6.0)

## 2014-11-07 NOTE — Plan of Care (Signed)
Problem: [facility] Participation in Recreation Therapeutic Interventions Goal: STG-Other Recreation Therapy Goal (Specify) STG: Decision Making - Within 4 treatment sessions, patient will verbalize understanding of decision making chart in each of 2 treatment sessions to increase good decision making skills post d/c.  Outcome: Progressing Treatment Session 1; Completed 1 out of 2: At approximately 4:05 pm, LRT met with patient in craft room. LRT educated patient on decision making chart. Patient verbalized understanding. LRT encouraged patient to use decision making chart.  Leonette Monarch, LRT/CTRS 06.24.16 5:25 pm  Problem: [facility] Participation in Recreation Therapeutic Interventions Goal: STG-Other Recreation Therapy Goal (Specify) STG: Time Management - Within 4 treatment sessions, patient will verbalized understanding of scheduling in each of 2 treatment sessions to increase time management skills post d/c.  Outcome: Progressing Treatment Session 2; Completed 1 out of 2: At approximately 4:05 pm, LRT met with patient in craft room. LRT educated patient on schedules. Patient verbalized understanding of scheduling. LRT provided patient with blank schedules. LRT encouraged patient to use schedules.  Leonette Monarch, LRT/CTRS 06.24.16 5:26 pm

## 2014-11-07 NOTE — [facility] Group Notes (Signed)
[place] LCSW Group Therapy  11/07/2014 3:32 PM  Type of Therapy:  Group Therapy  Participation Level:  Minimal  Participation Quality:  Attentive  Affect:  Appropriate  Cognitive:  Alert  Insight:  Limited  Engagement in Therapy:  Improving  Modes of Intervention:  Discussion, Problem-solving, Socialization and Support  Summary of Progress/Problems:Feelings around relapse and recovery: Pt will discuss emotions they experience before and after a relapse. Pt will be encouraged to explore feelings around recovery. Pinkey attended group and stayed the entire time. She sat quietly and listened to others.    [place] MSW, [place] 11/07/2014, 3:32 PM

## 2014-11-07 NOTE — Progress Notes (Signed)
Patient with sad affect and cooperative behavior with meds and plan of care. Refuses Miralox stating "she does not need it today". Sleepy in bed this am, does participate in med pass. Fair adls and refuses am meal. Encouraged to attend therapy groups to learn and initiate coping skills for management of stressors and diagnosis. Quiet with peers, verbalizes needs to staff. Safety maintained.

## 2014-11-07 NOTE — BHH Group Notes (Signed)
[place] Group Notes:  (Nursing/MHT/Case Management/Adjunct)  Date:  11/07/2014  Time:  11:10 PM  Type of Therapy:  Group Therapy  Participation Level:  Active  Participation Quality:  Appropriate and Attentive  Affect:  Appropriate  Cognitive:  Alert and Appropriate  Insight:  Appropriate and Good  Engagement in Group:  Engaged  Modes of Intervention:  Discussion  Summary of Progress/Problems:  Shavonn Convey Joy Aariyah Sampey 11/07/2014, 11:10 PM

## 2014-11-07 NOTE — Progress Notes (Signed)
[facility] MD Progress Note  11/07/2014 11:03 AM Melanie Bell  MRN:  [id number] Subjective:  Patient continues to report having auditory hallucinations. She continues to hear voices but cannot understand what they are saying and she described them as mumbling noises. Denies any intention or thoughts about wanting to hurt herself. Denies any homicidality. There is no evidence of suspiciousness or paranoia. Mood continues to be somewhat dysphoric but improving since admission, she spent she continues to be sad when thinks about her grandmother who passed away in 10-29-2022 years ago.  Patient denies any side effects from her medications. She denies any physical complaints this morning. Yesterday she complained of constipation but is states today she had a good bowel movement. She denies ingesting any foreign  Objects.  Principal Problem: Schizoaffective disorder, bipolar type Diagnosis:   Patient Active Problem List   Diagnosis Date Noted  . Schizoaffective disorder, bipolar type [F25.0] 11/06/2014  . Tardive dyskinesia [G24.01] 10/06/2014  . Tobacco use disorder [Z72.0] 09/30/2014  . H/O foreign body ingestion [Z87.821] 09/29/2014  . Hypothyroid [E03.9] 09/29/2014  . Hypertension [I10] 09/29/2014  . Constipation [K59.00] 09/29/2014   Total Time spent with patient: 30 minutes   Past Medical History:  Past Medical History  Diagnosis Date  . Hallucinations 09/30/2014    Sizoaffective  . Depression   . Anxiety   . Hypertension   . Tardive dyskinesia 10/2014    recent onset  . GERD (gastroesophageal reflux disease)     Past Surgical History  Procedure Laterality Date  . Breast lumpectomy    . Wisdom tooth extraction    . Abdominal surgery      "years ago" to remove foreign objects   Family History:  Family History  Problem Relation Age of Onset  . Depression Mother   . Hypertension Mother    Social History:  History  Alcohol Use No     History  Drug Use No    History   Social  History  . Marital Status: Single    Spouse Name: N/A  . Number of Children: N/A  . Years of Education: N/A   Social History Main Topics  . Smoking status: Current Every Day Smoker -- 0.50 packs/day for 3 years    Types: Cigarettes  . Smokeless tobacco: Not on file  . Alcohol Use: No  . Drug Use: No  . Sexual Activity: No   Other Topics Concern  . None   Social History Narrative   Additional History:    Sleep: Good  Appetite:  Good   Assessment:   Musculoskeletal: Strength & Muscle Tone: within normal limits Gait & Station: normal Patient leans: N/A   Psychiatric Specialty Exam: Physical Exam  Review of Systems  Constitutional: Negative.   HENT: Negative.   Eyes: Negative.   Respiratory: Negative.   Cardiovascular: Negative.   Gastrointestinal: Negative.   Genitourinary: Negative.   Musculoskeletal: Negative.   Skin: Negative.   Neurological: Negative.   Endo/Heme/Allergies: Negative.   Psychiatric/Behavioral: Positive for hallucinations.    Blood pressure 119/82, pulse 79, temperature 98.3 F (36.8 C), temperature source Oral, resp. rate 20, height 5\' 6"  (1.676 m), weight 98.884 kg (218 lb), SpO2 98 %.Body mass index is 35.2 kg/(m^2).  General Appearance: Fairly Groomed  Engineer, water::  Good  Speech:  Normal Rate  Volume:  Normal  Mood:  Euthymic  Affect:  Congruent  Thought Process:  Linear  Orientation:  Full (Time, Place, and Person)  Thought Content:  Hallucinations:  Auditory  Suicidal Thoughts:  No  Homicidal Thoughts:  No  Memory:  Immediate;   Good Recent;   Good Remote;   Good  Judgement:  Fair  Insight:  Fair  Psychomotor Activity:  Normal  Concentration:  Good  Recall:  NA  Fund of Knowledge:Fair  Language: Good  Akathisia:  No  Handed:    AIMS (if indicated):     Assets:  Communication Skills Desire for Improvement Housing Physical Health Social Support  ADL's:  Intact  Cognition: WNL  Sleep:  Number of Hours: 7.25      Current Medications: Current Facility-Administered Medications  Medication Dose Route Frequency Provider Last Rate Last Dose  . acetaminophen (TYLENOL) tablet 650 mg  650 mg Oral Q6H PRN [name], MD      . alum & mag hydroxide-simeth (MAALOX/MYLANTA) 200-200-20 MG/5ML suspension 30 mL  30 mL Oral Q4H PRN [name], MD      . benztropine (COGENTIN) tablet 1 mg  1 mg Oral QHS [name], MD   1 mg at 11/06/14 2129  . docusate sodium (COLACE) capsule 200 mg  200 mg Oral BID [name], MD   200 mg at 11/07/14 0912  . fluPHENAZine (PROLIXIN) tablet 10 mg  10 mg Oral QHS [name], MD      . fluPHENAZine (PROLIXIN) tablet 5 mg  5 mg Oral BID WC [name], MD   5 mg at 11/07/14 0815  . levothyroxine (SYNTHROID, LEVOTHROID) tablet 75 mcg  75 mcg Oral QAC breakfast [name], MD   75 mcg at 11/07/14 [phone]  . lithium carbonate (LITHOBID) CR tablet 300 mg  300 mg Oral Daily [name], MD   300 mg at 11/07/14 0912  . lithium carbonate (LITHOBID) CR tablet 600 mg  600 mg Oral QHS [name], MD   600 mg at 11/06/14 2128  . lurasidone (LATUDA) tablet 80 mg  80 mg Oral Q supper [name], MD   80 mg at 11/06/14 1656  . nicotine (NICOTROL) 10 MG inhaler 1 continuous puffing  1 continuous puffing Inhalation PRN [name], MD      . OXcarbazepine (TRILEPTAL) tablet 150 mg  150 mg Oral BID [name], MD   150 mg at 11/07/14 0929  . polyethylene glycol (MIRALAX / GLYCOLAX) packet 17 g  17 g Oral Daily [name], MD   17 g at 11/06/14 0815  . senna (SENOKOT) tablet 17.2 mg  2 tablet Oral QHS [name], MD   17.2 mg at 11/06/14 2128  . traZODone (DESYREL) tablet 50 mg  50 mg Oral QHS [name], MD   50 mg at 11/06/14 2129  . venlafaxine XR (EFFEXOR-XR) 24 hr capsule 150 mg  150 mg Oral Q breakfast [name], MD   150 mg at 11/07/14 7035    Lab Results:  Results for  orders placed or performed during the hospital encounter of 11/05/14 (from the past 48 hour(s))  Hemoglobin A1c     Status: None   Collection Time: 11/06/14  7:13 AM  Result Value Ref Range   Hgb A1c MFr Bld 5.0 4.0 - 6.0 %  Lipid panel, fasting     Status: Abnormal   Collection Time: 11/06/14  7:13 AM  Result Value Ref Range   Cholesterol 175 0 - 200 mg/dL   Triglycerides 74 <150 mg/dL   HDL 46 >40 mg/dL   Total CHOL/HDL Ratio 3.8 RATIO  VLDL 15 0 - 40 mg/dL   LDL Cholesterol 114 (H) 0 - 99 mg/dL    Comment:        Total Cholesterol/HDL:CHD Risk Coronary Heart Disease Risk Table                     Men   Women  1/2 Average Risk   3.4   3.3  Average Risk       5.0   4.4  2 X Average Risk   9.6   7.1  3 X Average Risk  23.4   11.0        Use the calculated Patient Ratio above and the CHD Risk Table to determine the patient's CHD Risk.        ATP III CLASSIFICATION (LDL):  <100     mg/dL   Optimal  100-129  mg/dL   Near or Above                    Optimal  130-159  mg/dL   Borderline  160-189  mg/dL   High  >190     mg/dL   Very High     Physical Findings: AIMS: Facial and Oral Movements Muscles of Facial Expression: Mild Lips and Perioral Area: Mild Jaw: None, normal Tongue: Moderate,Extremity Movements Upper (arms, wrists, hands, fingers): None, normal Lower (legs, knees, ankles, toes): None, normal, Trunk Movements Neck, shoulders, hips: None, normal, Overall Severity Severity of abnormal movements (highest score from questions above): Mild Incapacitation due to abnormal movements: None, normal Patient's awareness of abnormal movements (rate only patient's report): Aware, no distress, Dental Status Current problems with teeth and/or dentures?: No Does patient usually wear dentures?: No  CIWA:    COWS:     Treatment Plan Summary: Daily contact with patient to assess and evaluate symptoms and progress in treatment and Medication management    -Schizoaffective  disorder:   Psychosis:Patient will be continued on fluphenazine 5 mg with breakfast, 5 mg at  Lunch and 10 mg po qhs.  She will also will be continued on Latuda 80 mg at bedtime.    Mood: Continue venlafaxine XR a 150 mg by mouth every morning. Continue trileptal 150 mg po bid and lithium 300 mg po q day and 600 mg po qhs. Patient was not restarted on lithium in the emergency department however this medication was part of her discharge list and per [facility] she was taking this medication at the group home. Lithium was restarted on 6/23. I will check a lithium level on Monday.  -EPS: Continue benztropine 1 mg by mouth daily at bedtime  -Agitation: We'll order Ativan 2 mg every 6 hours when necessary  -Insomnia: Will continue the patient on trazodone 50 mg by mouth daily at bedtime when necessary  -Chronic constipation: Continue Colace but I will increase the dose to 200 mg by mouth twice a day. Continue Senokot but I will increase the dose to 2 tablets by mouth daily at bedtime. Continue MiraLAX daily  -Hypothyroidism: Patient will be continued on Synthroid 75 g daily  -Tobacco use disorder: start the patient on nicotine patch 14 mg  -Foreign body ingestion: abdominal xray from 6/23: showed no foreign objects.  -Collateral information: contacted [facility] owner today.  -Discharge planning: Once a stable patient will be discharged back to her group home. Most likely this patient is in need of deescalation and not on modifications or current pharmacological regimen. Group home on stated that she  cannot take her back until Tuesday as she will not physically present at the group home this week.    Medical Decision Making:  Established Problem, Stable/Improving (1)     [name] 11/07/2014, 11:03 AM

## 2014-11-07 NOTE — Plan of Care (Signed)
Problem: Ineffective individual coping Goal: STG:Pt. will utilize relaxation techniques to reduce stress STG: Patient will utilize relaxation techniques to reduce stress levels  Outcome: Progressing Patient actively participating in therapy groups and plan of care. Patient develops headache and takes a quiet time break in her room after administration of Tylenol. Good effect from Tylenol and quiet time is reported by patient. She recognized she needed a break from the noise on the unit and was able to utilize time apart from group to reduce stress level.

## 2014-11-07 NOTE — BHH Group Notes (Signed)
[place] Group Notes:  (Nursing/MHT/Case Management/Adjunct)  Date:  11/07/2014  Time:  3:23 AM  Type of Therapy:  Group Therapy  Participation Level:  Active  Participation Quality:  Appropriate  Affect:  Appropriate  Cognitive:  Appropriate  Insight:  Appropriate  Engagement in Group:  Engaged  Modes of Intervention:  n/a  Summary of Progress/Problems:  Melanie Bell 11/07/2014, 3:23 AM

## 2014-11-07 NOTE — Progress Notes (Signed)
Recreation Therapy Notes  Date: 06.24.16 Time: 3:00 pm Location: Craft Room  Group Topic: Problem Solving, Communication, Teamwork  Goal Area(s) Addresses:  Patient will work in teams towards shared goal. Patient will verbalize skills needed to make activity successful. Patient will verbalize benefit of using skills identified to reach post d/c goals.  Behavioral Response: Attentive, Interactive  Intervention: Landing Pad  Activity: Patients were given 15 straws and approximately 2.5 feet of tape and instructed to build a landing pad to catch a golf ball.  Education: LRT educated patients on why communication, teamwork, and problem solving are important.  Education Outcome: Acknowledges education/In group clarification offered   Clinical Observations/Feedback: Patient worked with team to build landing pad. Patient used effective teamwork, problem solving, and communication. Patient contributed to group discussion by stating what skills she used during group, and why these skills are important.  Leonette Monarch, LRT/CTRS 11/07/2014 5:09 PM

## 2014-11-07 NOTE — B[facility] Group Notes (Signed)
[facility] LCSW Aftercare Discharge Planning Group Note  11/07/2014 10:36 AM  Participation Quality:  Appropriate and Attentive  Affect:  Appropriate  Cognitive:  Alert, Appropriate and Oriented  Insight:  Engaged  Engagement in Group:  Engaged  Modes of Intervention:  Education, Socialization and Support  Summary of Progress/Problems: Patient attended and participated in group appropriately. Patient shared that her SMART goal is to "go to all groups and talk to the doctor about my medications".   Carmell Austria T 11/07/2014, 10:36 AM

## 2014-11-07 NOTE — BHH Group Notes (Signed)
[place] Group Notes:  (Nursing/MHT/Case Management/Adjunct)  Date:  11/07/2014  Time:  11:35 AM  Type of Therapy:  Group Therapy  Participation Level:  Active  Participation Quality:  Appropriate and Attentive  Affect:  Appropriate  Cognitive:  Alert, Appropriate and Oriented  Insight:  Appropriate  Engagement in Group:  Engaged  Modes of Intervention:  Activity  Summary of Progress/Problems:  Melanie Bell Melanie Bell Melanie Bell 11/07/2014, 11:35 AM

## 2014-11-07 NOTE — Progress Notes (Signed)
D: Patient denies SI/HI/AVH.  Patient affect is flat. Mood is depressed.  Patient did attend evening group. Patient visible on the milieu. No distress noted. A: Support and encouragement offered. Scheduled medications given to pt. Q 15 min checks continued for patient safety. R: Patient receptive. Patient remains safe on the unit.
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
## 2014-11-08 DIAGNOSIS — F250 Schizoaffective disorder, bipolar type: Principal | ICD-10-CM

## 2014-11-08 MED ORDER — POLYETHYLENE GLYCOL 3350 17 G PO PACK: 17.0000 g | PACK | Freq: Every day | ORAL | Status: DC | PRN

## 2014-11-08 NOTE — Progress Notes (Signed)
Patient has participated in treatment program today, taking meds and attending groups.  She denies SI/HI. States voices have decreased to "mumbles" and are not bothersome. She wrote that her goal today was to journal about "feelings before they overwhelm me." Stated she had a pen and paper to do this if needed. Continue to monitor.

## 2014-11-08 NOTE — Plan of Care (Signed)
Problem: Ineffective individual coping Goal: LTG: Patient will report a decrease in negative feelings Outcome: Progressing Patient continues to endorse depression but states she feels safer being in the hospital.  Goal: STG: Patient will remain free from self harm Outcome: Progressing No self-harm.

## 2014-11-08 NOTE — BHH Group Notes (Signed)
[facility] LCSW Group Therapy  11/08/2014 3:10 PM  Type of Therapy:  Group Therapy  Participation Level:  Minimal  Participation Quality:  Attentive  Affect:  Appropriate  Cognitive:  Alert  Insight:  Limited  Engagement in Therapy:  Limited  Modes of Intervention:  Discussion, Education, Socialization and Support  Summary of Progress/Problems:Pt will identify unhealthy thoughts and how they impact their emotions and behavior. Pt will be encouraged to discuss these thoughts, emotions and behaviors with the group. Pt will be asked to pick a positive affirmation and discuss what emotion they experienced.  Bekki read an affirmation about forgiveness. She states she is working on forgiving people in her past.    Wray Kearns, MSW, [facility]  11/08/2014, 3:10 PM

## 2014-11-08 NOTE — BHH Group Notes (Signed)
[facility] Group Notes:  (Nursing/MHT/Case Management/Adjunct)  Date:  11/08/2014  Time:  2:21 PM  Type of Therapy:  Group Therapy  Participation Level:  Minimal  Participation Quality:  Inattentive and Resistant  Affect:  Appropriate  Cognitive:  Alert, Appropriate and Oriented  Insight:  Appropriate  Engagement in Group:  Resistant  Modes of Intervention:  Problem-solving  Summary of Progress/Problems:  Sheron Tallman De'Chelle Cala Kruckenberg 11/08/2014, 2:21 PM

## 2014-11-08 NOTE — Progress Notes (Signed)
[facility] MD Progress Note  11/08/2014 2:26 PM Melanie Bell  MRN:  [id number] Subjective:  Patient continues to report having auditory hallucinations. She continues to hear voices but cannot understand what they are saying and she described them as mumbling noises. Denies any intention or thoughts about wanting to hurt herself. Denies any homicidality. Patient indicated overall she was doing fairly well however she still thinks about her grandmother that passed away 3 years ago around this time a year. She denied any competition on the medications however she stated that the MiraLAX that she's been getting daily has perhaps made her go "too much." She requests that this medication be changed to as needed  Principal Problem: Schizoaffective disorder, bipolar type Diagnosis:   Patient Active Problem List   Diagnosis Date Noted  . Schizoaffective disorder, bipolar type [F25.0] 11/06/2014  . Tardive dyskinesia [G24.01] 10/06/2014  . Tobacco use disorder [Z72.0] 09/30/2014  . H/O foreign body ingestion [Z87.821] 09/29/2014  . Hypothyroid [E03.9] 09/29/2014  . Hypertension [I10] 09/29/2014  . Constipation [K59.00] 09/29/2014   Total Time spent with patient: 30 minutes   Past Medical History:  Past Medical History  Diagnosis Date  . Hallucinations 09/30/2014    Sizoaffective  . Depression   . Anxiety   . Hypertension   . Tardive dyskinesia 10/2014    recent onset  . GERD (gastroesophageal reflux disease)     Past Surgical History  Procedure Laterality Date  . Breast lumpectomy    . Wisdom tooth extraction    . Abdominal surgery      "years ago" to remove foreign objects   Family History:  Family History  Problem Relation Age of Onset  . Depression Mother   . Hypertension Mother    Social History:  History  Alcohol Use No     History  Drug Use No    History   Social History  . Marital Status: Single    Spouse Name: N/A  . Number of Children: N/A  . Years of Education:  N/A   Social History Main Topics  . Smoking status: Current Every Day Smoker -- 0.50 packs/day for 3 years    Types: Cigarettes  . Smokeless tobacco: Not on file  . Alcohol Use: No  . Drug Use: No  . Sexual Activity: No   Other Topics Concern  . None   Social History Narrative   Additional History:    Sleep: Good  Appetite:  Good   Assessment:   Musculoskeletal: Strength & Muscle Tone: within normal limits Gait & Station: normal Patient leans: N/A   Psychiatric Specialty Exam: Physical Exam  Review of Systems  Constitutional: Negative.   HENT: Negative.   Eyes: Negative.   Respiratory: Negative.   Cardiovascular: Negative.   Gastrointestinal: Negative.   Genitourinary: Negative.   Musculoskeletal: Negative.   Skin: Negative.   Neurological: Negative.   Endo/Heme/Allergies: Negative.   Psychiatric/Behavioral: Positive for hallucinations.    Blood pressure 120/79, pulse 74, temperature 98.2 F (36.8 C), temperature source Oral, resp. rate 20, height 5\' 6"  (1.676 m), weight 98.884 kg (218 lb), SpO2 98 %.Body mass index is 35.2 kg/(m^2).  General Appearance: Fairly Groomed  Engineer, water::  Good  Speech:  Normal Rate  Volume:  Normal  Mood:  Euthymic  Affect:  Congruent  Thought Process:  Linear  Orientation:  Full (Time, Place, and Person)  Thought Content:  Hallucinations: Auditory  Suicidal Thoughts:  No  Homicidal Thoughts:  No  Memory:  Immediate;  Good Recent;   Good Remote;   Good  Judgement:  Fair  Insight:  Fair  Psychomotor Activity:  Normal  Concentration:  Good  Recall:  NA  Fund of Knowledge:Fair  Language: Good  Akathisia:  No  Handed:    AIMS (if indicated):     Assets:  Communication Skills Desire for Improvement Housing Physical Health Social Support  ADL's:  Intact  Cognition: WNL  Sleep:  Number of Hours: 7.25     Current Medications: Current Facility-Administered Medications  Medication Dose Route Frequency Provider  Last Rate Last Dose  . acetaminophen (TYLENOL) tablet 650 mg  650 mg Oral Q6H PRN [name], MD   650 mg at 11/07/14 1248  . alum & mag hydroxide-simeth (MAALOX/MYLANTA) 200-200-20 MG/5ML suspension 30 mL  30 mL Oral Q4H PRN [name], MD      . benztropine (COGENTIN) tablet 1 mg  1 mg Oral QHS [name], MD   1 mg at 11/07/14 2315  . docusate sodium (COLACE) capsule 200 mg  200 mg Oral BID [name], MD   200 mg at 11/08/14 0942  . fluPHENAZine (PROLIXIN) tablet 10 mg  10 mg Oral QHS [name], MD   10 mg at 11/07/14 2314  . fluPHENAZine (PROLIXIN) tablet 5 mg  5 mg Oral BID WC [name], MD   5 mg at 11/08/14 1224  . levothyroxine (SYNTHROID, LEVOTHROID) tablet 75 mcg  75 mcg Oral QAC breakfast [name], MD   75 mcg at 11/08/14 [phone]  . lithium carbonate (LITHOBID) CR tablet 300 mg  300 mg Oral Daily [name], MD   300 mg at 11/08/14 0943  . lithium carbonate (LITHOBID) CR tablet 600 mg  600 mg Oral QHS [name], MD   600 mg at 11/07/14 2314  . lurasidone (LATUDA) tablet 80 mg  80 mg Oral Q supper [name], MD   80 mg at 11/07/14 1642  . nicotine (NICOTROL) 10 MG inhaler 1 continuous puffing  1 continuous puffing Inhalation PRN [name], MD      . OXcarbazepine (TRILEPTAL) tablet 150 mg  150 mg Oral BID [name], MD   150 mg at 11/08/14 0943  . polyethylene glycol (MIRALAX / GLYCOLAX) packet 17 g  17 g Oral Daily PRN Marjie Skiff, MD      . senna [facility]) tablet 17.2 mg  2 tablet Oral QHS [name], MD   17.2 mg at 11/07/14 2314  . traZODone (DESYREL) tablet 50 mg  50 mg Oral QHS [name], MD   50 mg at 11/07/14 2315  . venlafaxine XR (EFFEXOR-XR) 24 hr capsule 150 mg  150 mg Oral Q breakfast [name], MD   150 mg at 11/08/14 0848    Lab Results:  No results found for this or any previous visit (from the past 67 hour(s)).  Physical  Findings: AIMS: Facial and Oral Movements Muscles of Facial Expression: Mild Lips and Perioral Area: Mild Jaw: None, normal Tongue: Mild,Extremity Movements Upper (arms, wrists, hands, fingers): None, normal Lower (legs, knees, ankles, toes): None, normal, Trunk Movements Neck, shoulders, hips: None, normal, Overall Severity Severity of abnormal movements (highest score from questions above): Mild Incapacitation due to abnormal movements: None, normal Patient's awareness of abnormal movements (rate only patient's report): Aware, no distress, Dental Status Current problems with teeth and/or dentures?: No Does patient usually wear dentures?: No  CIWA:    COWS:  Treatment Plan Summary: Daily contact with patient to assess and evaluate symptoms and progress in treatment and Medication management    -Schizoaffective disorder:   Psychosis:Patient will be continued on fluphenazine 5 mg with breakfast, 5 mg at  Lunch and 10 mg po qhs.  She will also will be continued on Latuda 80 mg at bedtime.    Mood: Continue venlafaxine XR a 150 mg by mouth every morning. Continue trileptal 150 mg po bid and lithium 300 mg po q day and 600 mg po qhs. Patient was not restarted on lithium in the emergency department however this medication was part of her discharge list and per [facility] she was taking this medication at the group home. Lithium was restarted on 6/23. I will check a lithium level on Monday.  -EPS: Continue benztropine 1 mg by mouth daily at bedtime  -Agitation: We'll order Ativan 2 mg every 6 hours when necessary  -Insomnia: Will continue the patient on trazodone 50 mg by mouth daily at bedtime when necessary  -Chronic constipation: Continue Colace but I will increase the dose to 200 mg by mouth twice a day. Continue Senokot but I will increase the dose to 2 tablets by mouth daily at bedtime. Change MiraLAX daily PRN.  -Hypothyroidism: Patient will be continued on Synthroid 75 g  daily  -Tobacco use disorder: start the patient on nicotine patch 14 mg  -Foreign body ingestion: abdominal xray from 6/23: showed no foreign objects.  -Collateral information: contacted [facility] owner today.  -Discharge planning: Once a stable patient will be discharged back to her group home. Most likely this patient is in need of deescalation and not on modifications or current pharmacological regimen. Group home on stated that she cannot take her back until Tuesday as she will not physically present at the group home this week.    Medical Decision Making:  Established Problem, Stable/Improving (1)     Faith Rogue 11/08/2014, 2:26 PM

## 2014-11-08 NOTE — Plan of Care (Signed)
Problem: Alteration in thought process Goal: LTG-Patient verbalizes understanding importance med regimen (Patient verbalizes understanding of importance of medication regimen and need to continue outpatient care.)  Outcome: Progressing Patient cooperative with meds. Acknowledges need to take them to control hallucinations.

## 2014-11-09 MED ORDER — FLUPHENAZINE HCL 5 MG PO TABS: 5.0000 mg | ORAL_TABLET | Freq: Every day | ORAL | Status: DC

## 2014-11-09 MED ORDER — SENNA 8.6 MG PO TABS: 2.0000 | ORAL_TABLET | Freq: Every day | ORAL | Status: DC

## 2014-11-09 MED ORDER — FLUPHENAZINE HCL 10 MG PO TABS: 10.0000 mg | ORAL_TABLET | Freq: Every day | ORAL | Status: DC

## 2014-11-09 MED ORDER — TRAZODONE HCL 50 MG PO TABS: 50.0000 mg | ORAL_TABLET | Freq: Every evening | ORAL | Status: DC | PRN

## 2014-11-09 NOTE — Plan of Care (Signed)
Problem: Ineffective individual coping Goal: LTG: Patient will report a decrease in negative feelings Outcome: Progressing Patient states she feels better.  Goal: STG: Pt will be able to identify effective and ineffective STG: Pt will be able to identify effective and ineffective coping patterns  Outcome: Progressing Patient states she understands where to go to get help when she feels bad and notes she does feel better now.  Goal: STG: Patient will remain free from self harm Outcome: Progressing No self harm.

## 2014-11-09 NOTE — BHH Group Notes (Signed)
[facility] LCSW Group Therapy  11/09/2014 3:36 PM  Type of Therapy:  Group Therapy  Participation Level:  Did Not Attend  Modes of Intervention:  Discussion, Education, Role-play, Socialization and Support  Summary of Progress/Problems:Healthy Communication: Pt will discuss the importance of communication. They will be encouraged to share examples of unhealthy communication they have experienced. Also, they will be encouraged to provide strategies for healthy communication.    [place] MSW, [place]  11/09/2014, 3:36 PM

## 2014-11-09 NOTE — BHH Group Notes (Signed)
[facility] Group Notes:  (Nursing/MHT/Case Management/Adjunct)  Date:  11/09/2014  Time:  10:48 AM  Type of Therapy:  Goals  Participation Level:  Active  Participation Quality:  Appropriate    Daralene Milch 11/09/2014, 10:48 AM

## 2014-11-09 NOTE — Progress Notes (Signed)
[facility] MD Progress Note  11/09/2014 10:09 AM Melanie Bell  MRN:  [id number] Subjective:  Patient continues to report having auditory hallucinations, talks of harming herself or swallow anything. She states that her loose stools have improved since the MiraLAX is been changed to when necessary. She woke up late this morning and stated that she typically does sleep late in the morning at home. She stated she is able to fall asleep fine at night. Denied any physical complaints  Principal Problem: Schizoaffective disorder, bipolar type Diagnosis:   Patient Active Problem List   Diagnosis Date Noted  . Schizoaffective disorder, bipolar type [F25.0] 11/06/2014  . Tardive dyskinesia [G24.01] 10/06/2014  . Tobacco use disorder [Z72.0] 09/30/2014  . H/O foreign body ingestion [Z87.821] 09/29/2014  . Hypothyroid [E03.9] 09/29/2014  . Hypertension [I10] 09/29/2014  . Constipation [K59.00] 09/29/2014   Total Time spent with patient: 30 minutes   Past Medical History:  Past Medical History  Diagnosis Date  . Hallucinations 09/30/2014    Sizoaffective  . Depression   . Anxiety   . Hypertension   . Tardive dyskinesia 10/2014    recent onset  . GERD (gastroesophageal reflux disease)     Past Surgical History  Procedure Laterality Date  . Breast lumpectomy    . Wisdom tooth extraction    . Abdominal surgery      "years ago" to remove foreign objects   Family History:  Family History  Problem Relation Age of Onset  . Depression Mother   . Hypertension Mother    Social History:  History  Alcohol Use No     History  Drug Use No    History   Social History  . Marital Status: Single    Spouse Name: N/A  . Number of Children: N/A  . Years of Education: N/A   Social History Main Topics  . Smoking status: Current Every Day Smoker -- 0.50 packs/day for 3 years    Types: Cigarettes  . Smokeless tobacco: Not on file  . Alcohol Use: No  . Drug Use: No  . Sexual Activity: No    Other Topics Concern  . None   Social History Narrative   Additional History:    Sleep: Good  Appetite:  Good   Assessment:   Musculoskeletal: Strength & Muscle Tone: within normal limits Gait & Station: normal Patient leans: N/A   Psychiatric Specialty Exam: Physical Exam  Review of Systems  Constitutional: Negative.   HENT: Negative.   Eyes: Negative.   Respiratory: Negative.   Cardiovascular: Negative.   Gastrointestinal: Negative.   Genitourinary: Negative.   Musculoskeletal: Negative.   Skin: Negative.   Neurological: Negative.   Endo/Heme/Allergies: Negative.   Psychiatric/Behavioral: Positive for hallucinations.    Blood pressure 116/81, pulse 62, temperature 97.9 F (36.6 C), temperature source Oral, resp. rate 20, height 5\' 6"  (1.676 m), weight 98.884 kg (218 lb), SpO2 98 %.Body mass index is 35.2 kg/(m^2).  General Appearance: Fairly Groomed  Engineer, water::  Good  Speech:  Normal Rate  Volume:  Normal  Mood:  Good  Affect:  Bright  Thought Process:  Linear  Orientation:  Full (Time, Place, and Person)  Thought Content:  Hallucinations: Auditory  Suicidal Thoughts:  No  Homicidal Thoughts:  No  Memory:  Immediate;   Good Recent;   Good Remote;   Good  Judgement:  Fair  Insight:  Fair  Psychomotor Activity:  Normal  Concentration:  Good  Recall:  NA  Fund of  Knowledge:Fair  Language: Good  Akathisia:  No  Handed:    AIMS (if indicated):     Assets:  Communication Skills Desire for Improvement Housing Physical Health Social Support  ADL's:  Intact  Cognition: WNL  Sleep:  Number of Hours: 7.5     Current Medications: Current Facility-Administered Medications  Medication Dose Route Frequency Provider Last Rate Last Dose  . acetaminophen (TYLENOL) tablet 650 mg  650 mg Oral Q6H PRN [name], MD   650 mg at 11/08/14 2136  . alum & mag hydroxide-simeth (MAALOX/MYLANTA) 200-200-20 MG/5ML suspension 30 mL  30 mL Oral Q4H PRN [name], MD      . benztropine (COGENTIN) tablet 1 mg  1 mg Oral QHS [name], MD   1 mg at 11/08/14 2138  . docusate sodium (COLACE) capsule 200 mg  200 mg Oral BID [name], MD   200 mg at 11/08/14 2137  . fluPHENAZine (PROLIXIN) tablet 10 mg  10 mg Oral QHS [name], MD   10 mg at 11/08/14 2138  . fluPHENAZine (PROLIXIN) tablet 5 mg  5 mg Oral BID WC [name], MD   5 mg at 11/09/14 0844  . levothyroxine (SYNTHROID, LEVOTHROID) tablet 75 mcg  75 mcg Oral QAC breakfast [name], MD   75 mcg at 11/09/14 0640  . lithium carbonate (LITHOBID) CR tablet 300 mg  300 mg Oral Daily [name], MD   300 mg at 11/08/14 0943  . lithium carbonate (LITHOBID) CR tablet 600 mg  600 mg Oral QHS [name], MD   600 mg at 11/08/14 2137  . lurasidone (LATUDA) tablet 80 mg  80 mg Oral Q supper [name], MD   80 mg at 11/08/14 1652  . nicotine (NICOTROL) 10 MG inhaler 1 continuous puffing  1 continuous puffing Inhalation PRN [name], MD      . OXcarbazepine (TRILEPTAL) tablet 150 mg  150 mg Oral BID [name], MD   150 mg at 11/08/14 2138  . polyethylene glycol (MIRALAX / GLYCOLAX) packet 17 g  17 g Oral Daily PRN Marjie Skiff, MD      . senna [facility]) tablet 17.2 mg  2 tablet Oral QHS [name], MD   17.2 mg at 11/08/14 2137  . traZODone (DESYREL) tablet 50 mg  50 mg Oral QHS [name], MD   50 mg at 11/08/14 2137  . venlafaxine XR (EFFEXOR-XR) 24 hr capsule 150 mg  150 mg Oral Q breakfast [name], MD   150 mg at 11/09/14 2423    Lab Results:  No results found for this or any previous visit (from the past 48 hour(s)).  Physical Findings: AIMS: Facial and Oral Movements Muscles of Facial Expression: Mild Lips and Perioral Area: Mild Jaw: None, normal Tongue: Mild,Extremity Movements Upper (arms, wrists, hands, fingers): None, normal Lower (legs, knees, ankles,  toes): None, normal, Trunk Movements Neck, shoulders, hips: None, normal, Overall Severity Severity of abnormal movements (highest score from questions above): Mild Incapacitation due to abnormal movements: None, normal Patient's awareness of abnormal movements (rate only patient's report): Aware, no distress, Dental Status Current problems with teeth and/or dentures?: No Does patient usually wear dentures?: No  CIWA:    COWS:     Treatment Plan Summary: Daily contact with patient to assess and evaluate symptoms and progress in treatment and Medication management    -Schizoaffective disorder:   Psychosis:Patient will be continued on  fluphenazine 5 mg with breakfast, 5 mg at  Lunch and 10 mg po qhs.  She will also will be continued on Latuda 80 mg at bedtime.    Mood: Continue venlafaxine XR a 150 mg by mouth every morning. Continue trileptal 150 mg po bid and lithium 300 mg po q day and 600 mg po qhs. Patient was not restarted on lithium in the emergency department however this medication was part of her discharge list and per [facility] she was taking this medication at the group home. Lithium was restarted on 6/23. I will check a lithium level on Monday.  -EPS: Continue benztropine 1 mg by mouth daily at bedtime  -Agitation: We'll order Ativan 2 mg every 6 hours when necessary  -Insomnia: Will continue the patient on trazodone 50 mg by mouth daily at bedtime when necessary  -Chronic constipation: Continue Colace but I will increase the dose to 200 mg by mouth twice a day. Continue Senokot but I will increase the dose to 2 tablets by mouth daily at bedtime. Change MiraLAX daily PRN.  -Hypothyroidism: Patient will be continued on Synthroid 75 g daily  -Tobacco use disorder: start the patient on nicotine patch 14 mg  -Foreign body ingestion: abdominal xray from 6/23: showed no foreign objects.  -Collateral information: contacted [facility] owner today.  -Discharge planning: Once a stable  patient will be discharged back to her group home. Most likely this patient is in need of deescalation and not on modifications or current pharmacological regimen. Group home on stated that she cannot take her back until Tuesday as she will not physically present at the group home this week.    Medical Decision Making:  Established Problem, Stable/Improving (1)     Melanie Bell 11/09/2014, 10:09 AM

## 2014-11-09 NOTE — Plan of Care (Signed)
Problem: Alteration in thought process Goal: LTG-Patient behavior demonstrates decreased signs psychosis (Patient behavior demonstrates decreased signs of psychosis to the point the patient is safe to return home and continue treatment in an outpatient setting.)  Outcome: Progressing States AH have nearly disappeared. Cooperative with meds.

## 2014-11-09 NOTE — Progress Notes (Signed)
Melanie Bell reports that the hallucinations have nearly disappeared. She said she didn't know why she decompensated while at home but did well in the hospital. She agreed that some of the people at the group home made her angry at times. She has been working on Radiographer, therapeutic to handle those emotions. She denies SI/HI. Continue to monitor.

## 2014-11-09 NOTE — Progress Notes (Signed)
Patient notes she feels better when she is in the hospital, noting she is working on handling her stress at the group home. She is pleasant and cooperative and interactive in the milieu. No needs or distress noted. Denies SI, HI, and AVH.

## 2014-11-09 NOTE — Plan of Care (Signed)
Problem: Ineffective individual coping Goal: LTG: Patient will report a decrease in negative feelings Outcome: Progressing Patient states feeling better while in the hospital.  Goal: STG: Pt will be able to identify effective and ineffective STG: Pt will be able to identify effective and ineffective coping patterns  Outcome: Progressing Working on effective stress management techniques for post discharge stress.   Goal: STG: Patient will remain free from self harm Outcome: Progressing No self-harm.

## 2014-11-10 LAB — LITHIUM LEVEL: Lithium Lvl: 0.8 mmol/L (ref 0.60–1.20)

## 2014-11-10 MED ORDER — AMLODIPINE BESYLATE 2.5 MG PO TABS: 2.5000 mg | ORAL_TABLET | Freq: Every day | ORAL | Status: DC

## 2014-11-10 NOTE — Progress Notes (Signed)
  [facility] Adult Case Management Discharge Plan :  Will you be returning to the same living situation after discharge:  Yes,    At discharge, do you have transportation home?: Yes,    Do you have the ability to pay for your medications: Yes,     Release of information consent forms completed and in the chart;  Patient's signature needed at discharge.  Patient to Follow up at: Follow-up Information    Follow up with [facility] On 11/12/2014.   Why:  Hospital Discharge Follow up appointment at 1:20 pm   Contact information:   [facility] Fax [phone]      Patient denies SI/HI: Yes,       Safety Planning and Suicide Prevention discussed: Yes,   patient recieved and reviewed suicide prevention and intervention handout  Have you used any form of tobacco in the last 30 days? (Cigarettes, Smokeless Tobacco, Cigars, and/or Pipes): Yes  Has patient been referred to the Quitline?: Patient refused referral  Joana Reamer 11/10/2014, 9:27 AM

## 2014-11-10 NOTE — BHH Group Notes (Signed)
[facility] Group Notes:  (Nursing/MHT/Case Management/Adjunct)  Date:  11/10/2014  Time:  4:46 AM  Type of Therapy:  Evening Wrap-up Group/Outside  Participation Level:  Minimal  Participation Quality:  Appropriate  Affect:  Appropriate  Cognitive:  Appropriate  Insight:  Good  Engagement in Group:  Minimal  Modes of Intervention:  Activity  Summary of Progress/Problems:  Levonne Spiller 11/10/2014, 4:46 AM

## 2014-11-10 NOTE — BHH Suicide Risk Assessment (Signed)
[facility] INPATIENT:  Family/Significant Other Suicide Prevention Education  Suicide Prevention Education:  Education Completed; Ms  [place] Constable) has been identified by the patient as the family member/significant other with whom the patient will be residing, and identified as the person(s) who will aid the patient in the event of a mental health crisis (suicidal ideations/suicide attempt).  With written consent from the patient, the family member/significant other has been provided the following suicide prevention education, prior to the and/or following the discharge of the patient.  The suicide prevention education provided includes the following:  Suicide risk factors  Suicide prevention and interventions  National Suicide Hotline telephone number  [facility] assessment telephone number  [facility] Emergency Assistance [place] and/or Residential Mobile Crisis Unit telephone number  Request made of family/significant other to:  Remove weapons (e.g., guns, rifles, knives), all items previously/currently identified as safety concern.    Remove drugs/medications (over-the-counter, prescriptions, illicit drugs), all items previously/currently identified as a safety concern.  The family member/significant other verbalizes understanding of the suicide prevention education information provided.  The family member/significant other agrees to remove the items of safety concern listed above.  Enis Slipper M 11/10/2014, 9:26 AM

## 2014-11-10 NOTE — BHH Group Notes (Signed)
[facility] LCSW Aftercare Discharge Planning Group Note  11/10/2014 4:14 PM  Participation Quality:  Appropriate  Affect:  Appropriate  Cognitive:  Appropriate  Insight:  Developing/Improving  Engagement in Group:  Developing/Improving  Modes of Intervention:  Discussion, Education, Exploration and Support  Summary of Progress/Problems: Patients goal was to get discharged today  Melanie Bell 11/10/2014, 4:14 PM

## 2014-11-10 NOTE — Progress Notes (Signed)
Recreation Therapy Notes  INPATIENT RECREATION TR PLAN  Patient Details [name] MRN: [id number] DOB: 1991-03-03 Today's Date: 11/10/2014  Rec Therapy Plan Is patient appropriate for Therapeutic Recreation?: Yes Treatment times per week: At least 2 times a week TR Treatment/Interventions: 1:1 session, Group participation (Comment) (Appropriate participation in daily recreation therapy tx)  Discharge Criteria Pt will be discharged from therapy if:: Discharged Treatment plan/goals/alternatives discussed and agreed upon by:: Patient/family  Discharge Summary Short term goals set: See Care Plan Progress toward goals comments: One-to-one attended Which groups?: Communication, Social skills, Leisure education One-to-one attended: Decision making, time management Reason goals not met: N/A Therapeutic equipment acquired: None Reason patient discharged from therapy: Discharge from hospital Pt/family agrees with progress & goals achieved: Yes Date patient discharged from therapy: 11/10/14   Leonette Monarch, LRT/CTRS 11/10/2014, 1:37 PM

## 2014-11-10 NOTE — Progress Notes (Signed)
Nurs Dischg Note:  D:Patient denies SI/HI at this time. Pt appears calm and cooperative, and no distress noted Returning to Group Home  A: All Personal items in locker returned to pt. Pt escorted out of the building.Writer reviewed discharge information Prescriptions and FL2  Given to staff at [facility] she will comply with outpatient services, and take MEDS as prescribed. Escorted to visitors entrance  With staff

## 2014-11-10 NOTE — BHH Suicide Risk Assessment (Signed)
[facility] Discharge Suicide Risk Assessment   Demographic Factors:  Adolescent or young adult and Caucasian  Total Time spent with patient: 30 minutes  Psychiatric Specialty Exam: Physical Exam  ROS                                                         Have you used any form of tobacco in the last 30 days? (Cigarettes, Smokeless Tobacco, Cigars, and/or Pipes): Yes  Has this patient used any form of tobacco in the last 30 days? (Cigarettes, Smokeless Tobacco, Cigars, and/or Pipes) Yes, A prescription for an FDA-approved tobacco cessation medication was offered at discharge and the patient refused  Mental Status Per Nursing Assessment::   On Admission:  Suicidal ideation indicated by patient  Current Mental Status by Physician: Denies SI, HI or auditory or visual hallucinations. Patient is denying hallucinations today. She denies thoughts of wanting to hurt herself. She does not appears suspicious, paranoid, anxious or agitated. She was calm pleasant and cooperative.  Loss Factors: NA  Historical Factors: Prior suicide attempts, Anniversary of important loss and Impulsivity  Risk Reduction Factors:   Sense of responsibility to family, Living with another person, especially a relative and Positive social support  Continued Clinical Symptoms:  More than one psychiatric diagnosis Previous Psychiatric Diagnoses and Treatments  Cognitive Features That Contribute To Risk:  Closed-mindedness    Suicide Risk:  Minimal: No identifiable suicidal ideation.  Patients presenting with no risk factors but with morbid ruminations; may be classified as minimal risk based on the severity of the depressive symptoms  Principal Problem: Schizoaffective disorder, bipolar type Discharge Diagnoses:  Patient Active Problem List   Diagnosis Date Noted  . Schizoaffective disorder, bipolar type [F25.0] 11/06/2014  . Tardive dyskinesia [G24.01] 10/06/2014  . Tobacco use disorder  [Z72.0] 09/30/2014  . H/O foreign body ingestion [Z87.821] 09/29/2014  . Hypothyroid [E03.9] 09/29/2014  . Hypertension [I10] 09/29/2014  . Constipation [K59.00] 09/29/2014    Follow-up Information    Follow up with [facility] On 11/12/2014.   Why:  Hospital Discharge Follow up appointment at 1:20 pm   Contact information:   [place] [place] Fax [phone]      Plan Of Care/Follow-up recommendations:  Other:  Follow-up with outpatient psychiatry  Is patient on multiple antipsychotic therapies at discharge:  Yes,   Do you recommend tapering to monotherapy for antipsychotics?  No   Has Patient had three or more failed trials of antipsychotic monotherapy by history:  Yes,   Antipsychotic medications that previously failed include:   1.  Latuda. and 2.  Prolixin.  Recommended Plan for Multiple Antipsychotic Therapies: Additional reason(s) for multiple antispychotic treatment:  Patient has failed monotherapy is with several anti-psychotics. She has been on Clozaril but developed side effects (is unclear as to what they were). Patient has been is stable on Latuda and Prolixin combination and therefore it will be maintained    [name] 11/10/2014, 10:58 AM

## 2014-11-10 NOTE — BHH Group Notes (Signed)
[facility] LCSW Group Therapy  11/10/2014 4:12 PM  Type of Therapy:  Group Therapy  Participation Level:  Active  Participation Quality:  Drowsy  Affect:  Appropriate  Cognitive:  Appropriate  Insight:  Improving  Engagement in Therapy:  Developing/Improving  Modes of Intervention:  Discussion, Education, Exploration and Support  Summary of Progress/Problems::LCSW reviewed group rules. Patients were to explore thoughts and feelings when their lives become unbalanced. As a collective group patients were encouraged to offer peer to peer support and to find ways to improve overall balance in their lives and to maintain their balance. Several thoughts, and ideas were shared in the group. This patient was able to support others and share her own battles of depression.  Enis Slipper M 11/10/2014, 4:12 PM

## 2014-11-10 NOTE — Plan of Care (Signed)
Problem: [facility] Participation in Recreation Therapeutic Interventions Goal: STG-Other Recreation Therapy Goal (Specify) STG: Decision Making - Within 4 treatment sessions, patient will verbalize understanding of decision making chart in each of 2 treatment sessions to increase good decision making skills post d/c.  Outcome: Completed/Met Date Met:  11/10/14 Treatment Session 2; Completed 2 out of 2: At approximately 12:15 pm, LRT met with patient in craft room. LRT educated patient on a decision making chart. Patient verbalized understanding. Patient reported she did not have any questions.  Leonette Monarch, LRT/CTRS 06.27.16 1:35 pm  Problem: [facility] Participation in Recreation Therapeutic Interventions Goal: STG-Other Recreation Therapy Goal (Specify) STG: Time Management - Within 4 treatment sessions, patient will verbalized understanding of scheduling in each of 2 treatment sessions to increase time management skills post d/c.  Outcome: Completed/Met Date Met:  11/10/14 Treatment Session 2; Completed 2 out of 2: At approximately 12:15 pm, LRT met with patient in craft room. Patient verbalized understanding of the schedules. Patient reported she did not have any questions.  Leonette Monarch, LRT/CTRS 06.27.16 1:34 pm

## 2014-11-10 NOTE — Progress Notes (Signed)
Recreation Therapy Notes  Date: 06.27.16 Time: 3:00 pm Location: Craft Room  Group Topic: Wellness  Goal Area(s) Addresses:  Patient will identify at least one item per dimension of health. Patient will examine areas they are deficient.  Behavioral Response: Attentive  Intervention: 6 Dimensions of Health  Activity: Patients were given a worksheet with the definitions of the 6 dimensions of health and instructed to read the worksheet. Patients were given a worksheet with the 6 dimensions on it and instructed to list at least one thing they are currently doing in each category.  Education: LRT educated patient on the 6 dimensions of health   Education Outcome: In group clarification offered  Clinical Observations/Feedback: Patient completed activity by listing at least one thing in each category. Patient did not contribute to group discussion.  Leonette Monarch, LRT/CTRS 11/10/2014 4:44 PM
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
## 2014-11-11 NOTE — Progress Notes (Signed)
[facility] H&P Discharge Summary faxed to [facility] for hospital follow-up

## 2014-11-27 ENCOUNTER — Observation Stay
Admission: EM | Admit: 2014-11-27 | Discharge: 2014-11-28 | Disposition: A | Discharge status: Home / Self Care | Payer: Medicaid Other | Attending: Internal Medicine | Admitting: Internal Medicine

## 2014-11-27 ENCOUNTER — Emergency Department: Payer: Medicaid Other

## 2014-11-27 ENCOUNTER — Encounter: Payer: Self-pay | Admitting: Emergency Medicine

## 2014-11-27 DIAGNOSIS — K5900 Constipation, unspecified: Secondary | ICD-10-CM | POA: Diagnosis not present

## 2014-11-27 DIAGNOSIS — E039 Hypothyroidism, unspecified: Secondary | ICD-10-CM | POA: Insufficient documentation

## 2014-11-27 DIAGNOSIS — Z87821 Personal history of retained foreign body fully removed: Secondary | ICD-10-CM

## 2014-11-27 DIAGNOSIS — I10 Essential (primary) hypertension: Secondary | ICD-10-CM | POA: Diagnosis present

## 2014-11-27 DIAGNOSIS — F419 Anxiety disorder, unspecified: Secondary | ICD-10-CM | POA: Diagnosis not present

## 2014-11-27 DIAGNOSIS — Z79899 Other long term (current) drug therapy: Secondary | ICD-10-CM | POA: Diagnosis not present

## 2014-11-27 DIAGNOSIS — F329 Major depressive disorder, single episode, unspecified: Secondary | ICD-10-CM | POA: Diagnosis not present

## 2014-11-27 DIAGNOSIS — K319 Disease of stomach and duodenum, unspecified: Secondary | ICD-10-CM | POA: Diagnosis not present

## 2014-11-27 DIAGNOSIS — T182XXA Foreign body in stomach, initial encounter: Secondary | ICD-10-CM | POA: Diagnosis present

## 2014-11-27 DIAGNOSIS — F250 Schizoaffective disorder, bipolar type: Secondary | ICD-10-CM | POA: Diagnosis present

## 2014-11-27 DIAGNOSIS — T183XXA Foreign body in small intestine, initial encounter: Principal | ICD-10-CM | POA: Insufficient documentation

## 2014-11-27 DIAGNOSIS — T189XXA Foreign body of alimentary tract, part unspecified, initial encounter: Secondary | ICD-10-CM | POA: Diagnosis present

## 2014-11-27 DIAGNOSIS — G8929 Other chronic pain: Secondary | ICD-10-CM | POA: Diagnosis not present

## 2014-11-27 DIAGNOSIS — K219 Gastro-esophageal reflux disease without esophagitis: Secondary | ICD-10-CM | POA: Diagnosis not present

## 2014-11-27 DIAGNOSIS — F17210 Nicotine dependence, cigarettes, uncomplicated: Secondary | ICD-10-CM | POA: Insufficient documentation

## 2014-11-27 LAB — URINE DRUG SCREEN, QUALITATIVE (ARMC ONLY)
Amphetamines, Ur Screen: NOT DETECTED
Barbiturates, Ur Screen: NOT DETECTED
Benzodiazepine, Ur Scrn: NOT DETECTED
CANNABINOID 50 NG, UR ~~LOC~~: NOT DETECTED
Cocaine Metabolite,Ur ~~LOC~~: NOT DETECTED
MDMA (Ecstasy)Ur Screen: NOT DETECTED
Methadone Scn, Ur: NOT DETECTED
OPIATE, UR SCREEN: NOT DETECTED
Phencyclidine (PCP) Ur S: NOT DETECTED
Tricyclic, Ur Screen: NOT DETECTED

## 2014-11-27 LAB — CBC
HCT: 37.5 % (ref 35.0–47.0)
Hemoglobin: 12.5 g/dL (ref 12.0–16.0)
MCH: 30.6 pg (ref 26.0–34.0)
MCHC: 33.3 g/dL (ref 32.0–36.0)
MCV: 92 fL (ref 80.0–100.0)
PLATELETS: 180 10*3/uL (ref 150–440)
RBC: 4.08 MIL/uL (ref 3.80–5.20)
RDW: 13.3 % (ref 11.5–14.5)
WBC: 7.6 10*3/uL (ref 3.6–11.0)

## 2014-11-27 LAB — COMPREHENSIVE METABOLIC PANEL
ALK PHOS: 74 U/L (ref 38–126)
ALT: 7 U/L — ABNORMAL LOW (ref 14–54)
ANION GAP: 8 (ref 5–15)
AST: 16 U/L (ref 15–41)
Albumin: 4.4 g/dL (ref 3.5–5.0)
BUN: 7 mg/dL (ref 6–20)
CHLORIDE: 102 mmol/L (ref 101–111)
CO2: 25 mmol/L (ref 22–32)
Calcium: 9.6 mg/dL (ref 8.9–10.3)
Creatinine, Ser: 0.67 mg/dL (ref 0.44–1.00)
GFR calc Af Amer: >60 mL/min (ref >60)
Glucose, Bld: 93 mg/dL (ref 65–99)
Potassium: 3.5 mmol/L (ref 3.5–5.1)
Sodium: 135 mmol/L (ref 135–145)
Total Bilirubin: 0.3 mg/dL (ref 0.3–1.2)
Total Protein: 7.3 g/dL (ref 6.5–8.1)

## 2014-11-27 LAB — ETHANOL

## 2014-11-27 IMAGING — CR DG CHEST 2V
2 series · 2 of 2 positions shown · non-contrast
Comparison: [DATE], [DATE]

CLINICAL DATA: Swallowed safety pin

EXAM:
CHEST - 2 VIEW

[chest pa]
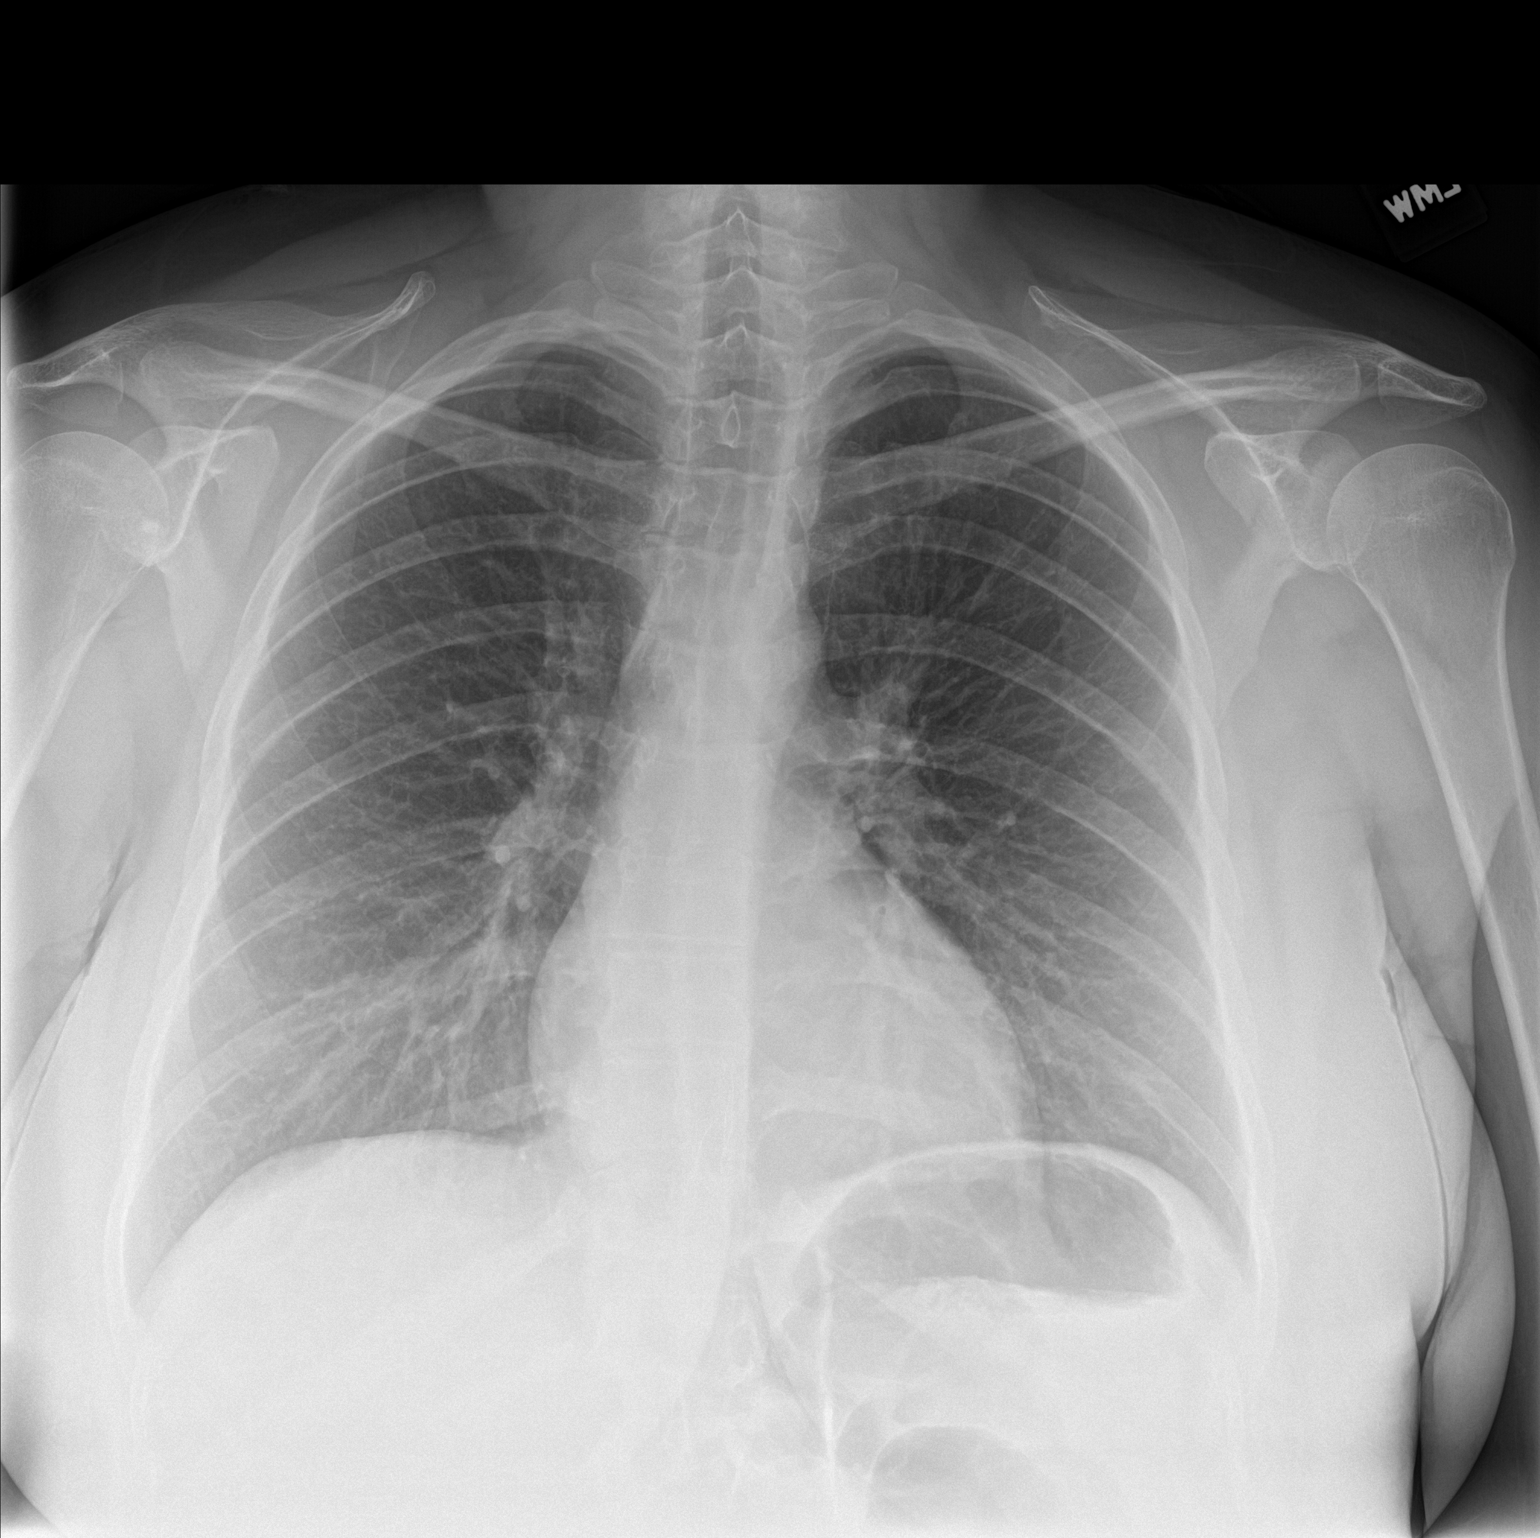

[chest lat]
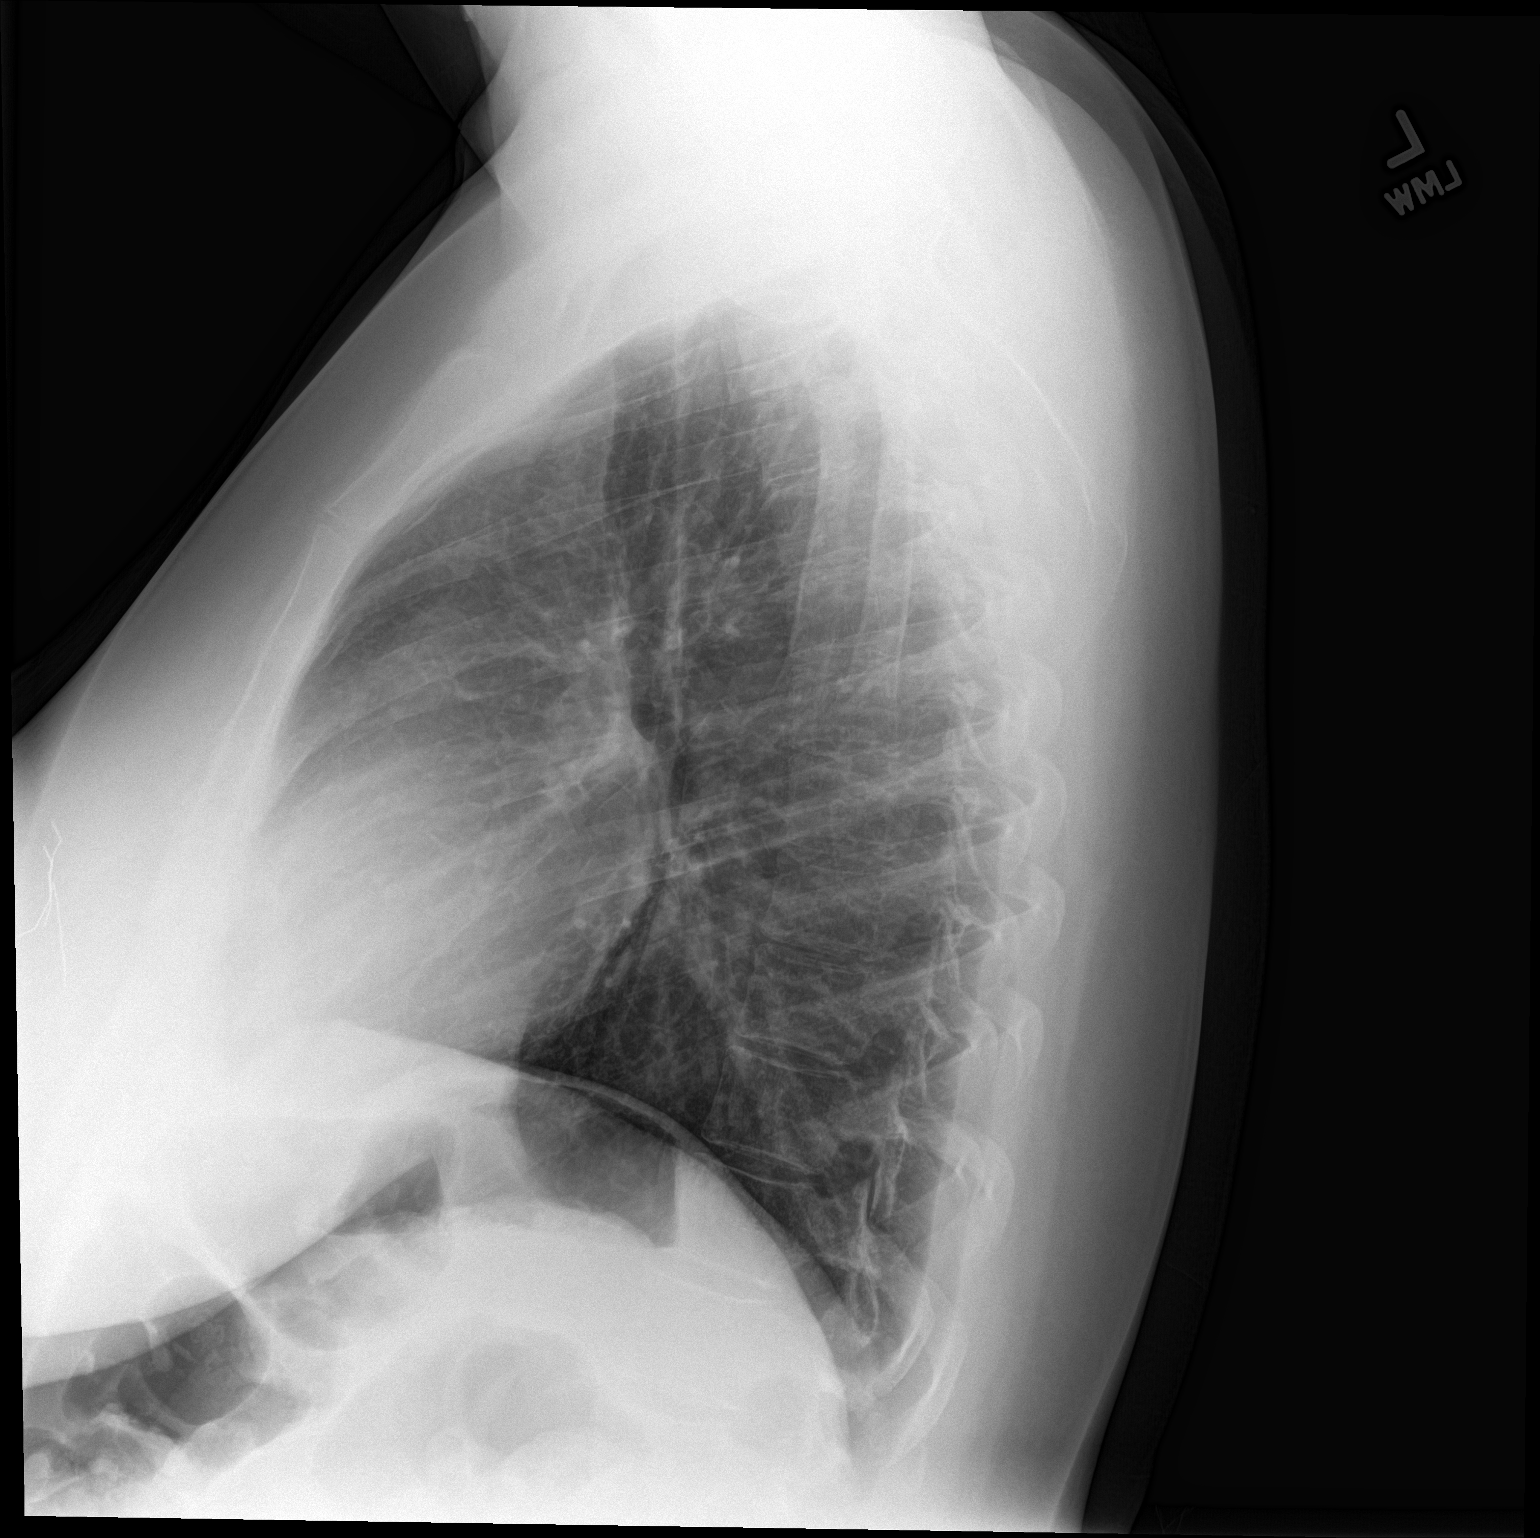

[2 of 2 positions shown; findings below may reference images not displayed]

FINDINGS: Cardiac shadow is within normal limits. The lungs are well aerated
bilaterally. No focal infiltrate or sizable effusion is seen. The
known safety pin within the stomach is not appreciated on this exam.
A 3 linear metallic density seen on the prior abdominal film the
project in the soft tissues of the anterior chest wall in the
midline and are likely subcutaneous in nature. No other focal
abnormality is noted.
IMPRESSION: The previously described linear metallic densities lie within the
subcutaneous tissues of the anterior chest in the midline. No other
focal abnormality is seen.

## 2014-11-27 IMAGING — CR DG ABDOMEN 1V
2 series · 2 of 2 positions shown · non-contrast
Comparison: [DATE]

CLINICAL DATA: Patient swallowed open safety pin

EXAM:
ABDOMEN - 1 VIEW

[abdomen kub (1 of 2)]
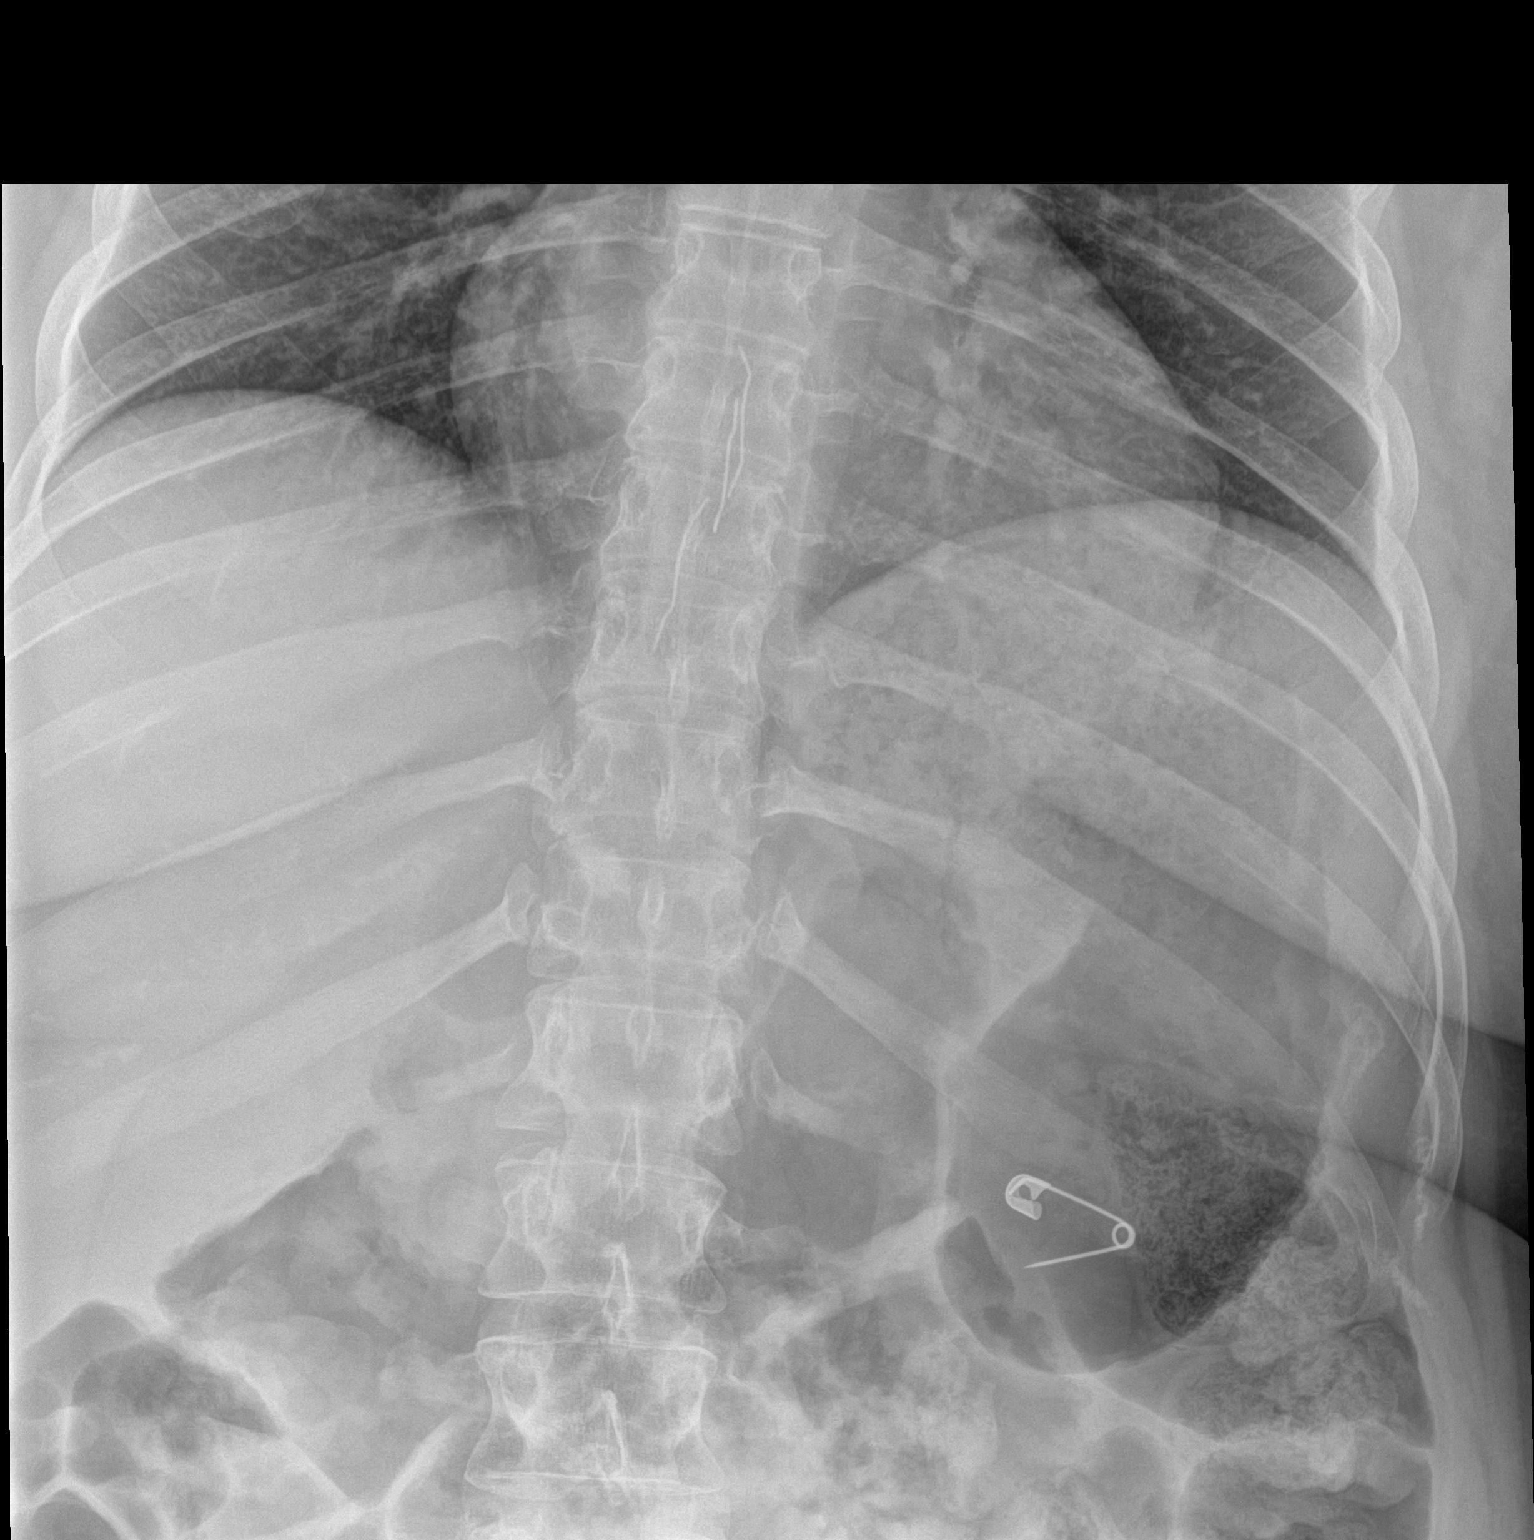

[abdomen kub (2 of 2)]
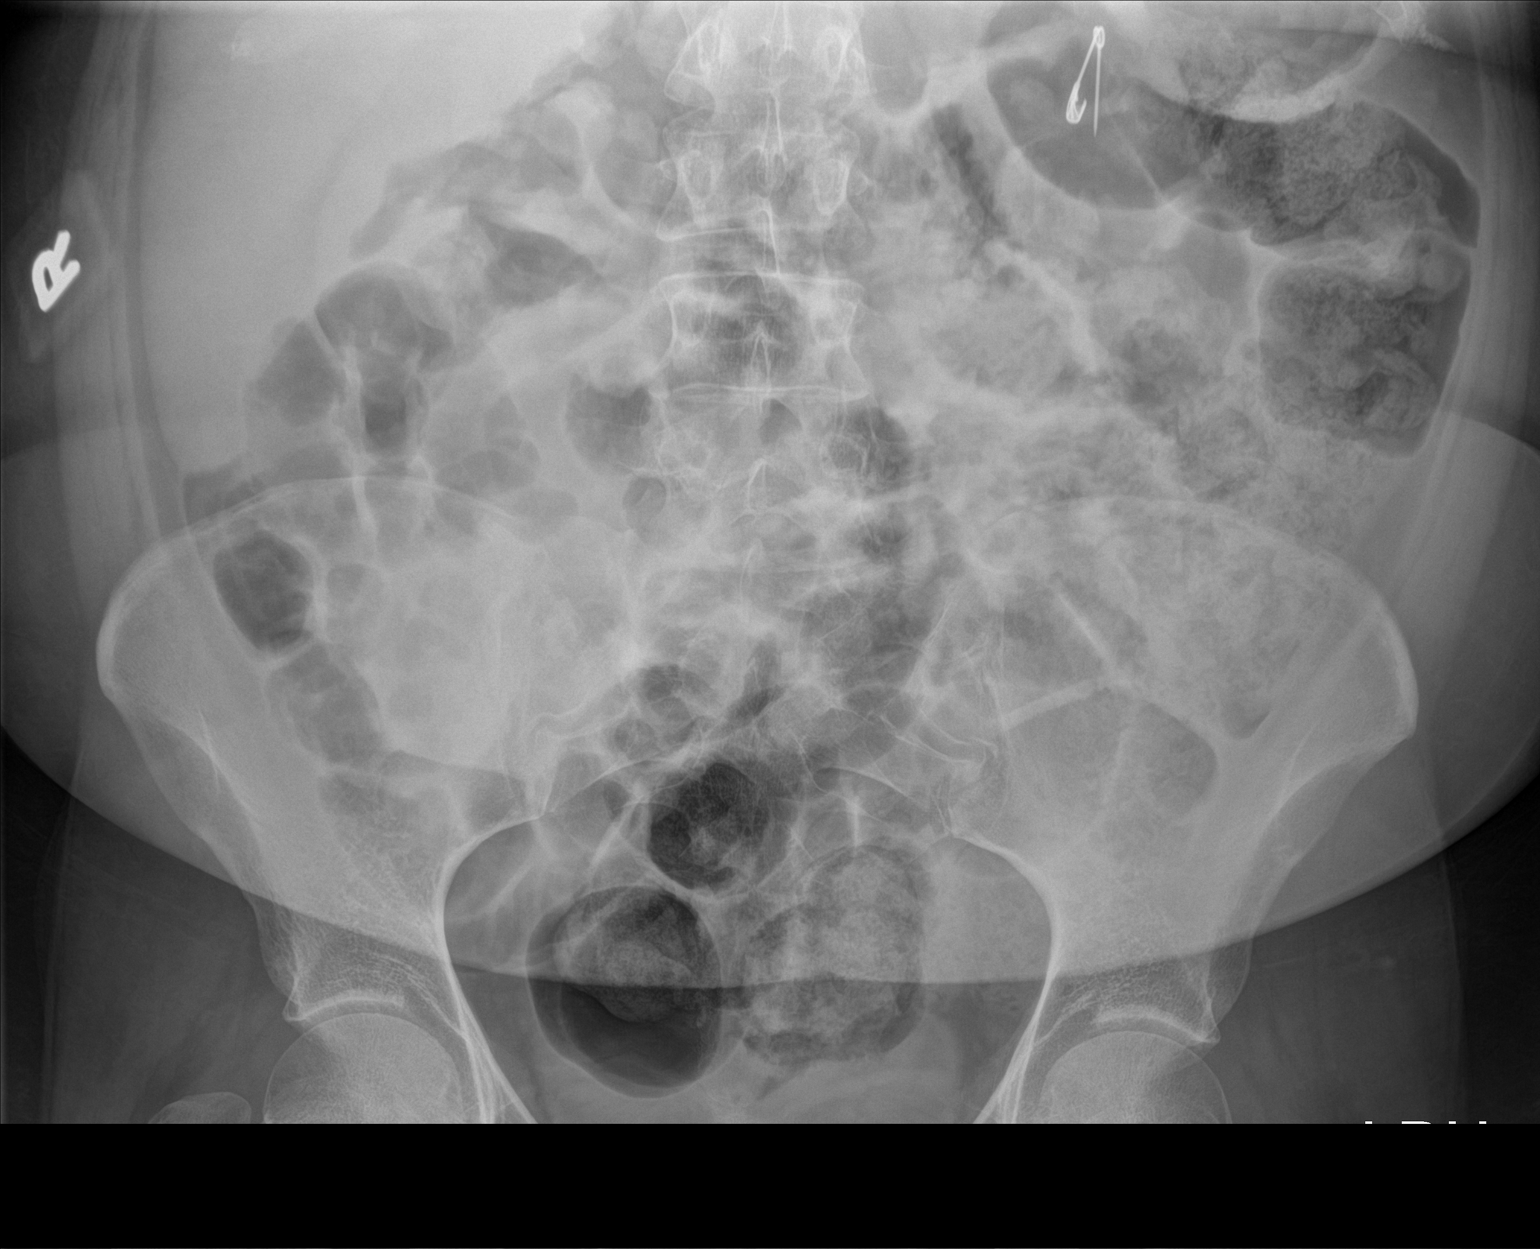

[2 of 2 positions shown; findings below may reference images not displayed]

FINDINGS: There is an open safety pin in the body of the stomach on the left.
There is moderate stool throughout the colon. There is no bowel
dilatation or air-fluid level suggesting obstruction. No free air.
Lung bases are clear.

There are 3 linear metallic foreign bodies overlying the lower
thoracic spine in the midline of the lower chest region.
IMPRESSION: Open safety pin within stomach. Bowel gas pattern unremarkable. No
free air. There are 3 linear metallic foreign bodies overlying the
lower thoracic region of uncertain etiology. These linear metallic
foreign bodies potentially could be located within the distal
esophagus. PA and lateral chest radiograph could be helpful in this
regard for further localization.

These results were called by telephone at the time of interpretation
on [DATE] at [DATE] to Dr. GENET , who verbally
acknowledged these results.

## 2014-11-27 MED ORDER — VENLAFAXINE HCL ER 75 MG PO CP24
150.0000 mg | ORAL_CAPSULE | Freq: Every day | ORAL | Status: DC
  Filled 2014-11-27 (×2): qty 1

## 2014-11-27 MED ORDER — ACETAMINOPHEN 650 MG RE SUPP: 650.0000 mg | Freq: Four times a day (QID) | RECTAL | Status: DC | PRN

## 2014-11-27 MED ORDER — ONDANSETRON HCL 4 MG/2ML IJ SOLN: 4.0000 mg | Freq: Four times a day (QID) | INTRAMUSCULAR | Status: DC | PRN

## 2014-11-27 MED ORDER — OXCARBAZEPINE 300 MG PO TABS
150.0000 mg | ORAL_TABLET | Freq: Two times a day (BID) | ORAL | Status: DC
  Administered 2014-11-27: 150 mg via ORAL
  Filled 2014-11-27 (×3): qty 1

## 2014-11-27 MED ORDER — VENLAFAXINE HCL ER 75 MG PO CP24: 150.0000 mg | ORAL_CAPSULE | Freq: Every day | ORAL | Status: DC

## 2014-11-27 MED ORDER — DOCUSATE SODIUM 100 MG PO CAPS: 200.0000 mg | ORAL_CAPSULE | Freq: Two times a day (BID) | ORAL | Status: DC

## 2014-11-27 MED ORDER — BENZTROPINE MESYLATE 1 MG PO TABS
1.0000 mg | ORAL_TABLET | Freq: Every day | ORAL | Status: DC
  Filled 2014-11-27 (×2): qty 1

## 2014-11-27 MED ORDER — AMLODIPINE BESYLATE 5 MG PO TABS
2.5000 mg | ORAL_TABLET | Freq: Every day | ORAL | Status: DC
  Administered 2014-11-28: 2.5 mg via ORAL
  Filled 2014-11-27: qty 1

## 2014-11-27 MED ORDER — FLUPHENAZINE HCL 5 MG PO TABS
10.0000 mg | ORAL_TABLET | Freq: Every day | ORAL | Status: DC
  Filled 2014-11-27 (×2): qty 2

## 2014-11-27 MED ORDER — SENNA 8.6 MG PO TABS: 2.0000 | ORAL_TABLET | Freq: Every day | ORAL | Status: DC

## 2014-11-27 MED ORDER — ONDANSETRON HCL 4 MG PO TABS: 4.0000 mg | ORAL_TABLET | Freq: Four times a day (QID) | ORAL | Status: DC | PRN

## 2014-11-27 MED ORDER — FLUPHENAZINE HCL 5 MG PO TABS
5.0000 mg | ORAL_TABLET | Freq: Every day | ORAL | Status: DC
  Administered 2014-11-28: 5 mg via ORAL
  Filled 2014-11-27: qty 1

## 2014-11-27 MED ORDER — ALBUTEROL SULFATE (2.5 MG/3ML) 0.083% IN NEBU: 2.5000 mg | INHALATION_SOLUTION | RESPIRATORY_TRACT | Status: DC | PRN

## 2014-11-27 MED ORDER — LITHIUM CARBONATE ER 300 MG PO TBCR
600.0000 mg | EXTENDED_RELEASE_TABLET | Freq: Every day | ORAL | Status: DC
  Administered 2014-11-27: 600 mg via ORAL
  Filled 2014-11-27 (×2): qty 2

## 2014-11-27 MED ORDER — LEVOTHYROXINE SODIUM 50 MCG PO TABS: 75.0000 ug | ORAL_TABLET | Freq: Every day | ORAL | Status: DC

## 2014-11-27 MED ORDER — LURASIDONE HCL 40 MG PO TABS
80.0000 mg | ORAL_TABLET | Freq: Every day | ORAL | Status: DC
  Filled 2014-11-27: qty 1

## 2014-11-27 MED ORDER — LURASIDONE HCL 40 MG PO TABS
80.0000 mg | ORAL_TABLET | Freq: Every day | ORAL | Status: DC
  Administered 2014-11-28: 80 mg via ORAL
  Filled 2014-11-27: qty 2

## 2014-11-27 MED ORDER — OXCARBAZEPINE 300 MG PO TABS: 150.0000 mg | ORAL_TABLET | Freq: Two times a day (BID) | ORAL | Status: DC

## 2014-11-27 MED ORDER — TRAZODONE HCL 50 MG PO TABS
50.0000 mg | ORAL_TABLET | Freq: Every day | ORAL | Status: DC
  Administered 2014-11-27: 50 mg via ORAL
  Filled 2014-11-27: qty 1

## 2014-11-27 MED ORDER — FLUPHENAZINE HCL 5 MG PO TABS
10.0000 mg | ORAL_TABLET | Freq: Every day | ORAL | Status: DC
  Administered 2014-11-27: 10 mg via ORAL
  Filled 2014-11-27 (×2): qty 1

## 2014-11-27 MED ORDER — ACETAMINOPHEN 325 MG PO TABS
650.0000 mg | ORAL_TABLET | Freq: Four times a day (QID) | ORAL | Status: DC | PRN
  Administered 2014-11-27: 650 mg via ORAL
  Filled 2014-11-27: qty 2

## 2014-11-27 MED ORDER — FLUPHENAZINE HCL 5 MG PO TABS
5.0000 mg | ORAL_TABLET | Freq: Every day | ORAL | Status: DC
  Filled 2014-11-27: qty 1

## 2014-11-27 MED ORDER — LITHIUM CARBONATE ER 300 MG PO TBCR
300.0000 mg | EXTENDED_RELEASE_TABLET | Freq: Every morning | ORAL | Status: DC
  Filled 2014-11-27: qty 1

## 2014-11-27 MED ORDER — TRAZODONE HCL 50 MG PO TABS: 50.0000 mg | ORAL_TABLET | Freq: Every evening | ORAL | Status: DC | PRN

## 2014-11-27 NOTE — ED Notes (Signed)
Pt to xray at this time.

## 2014-11-27 NOTE — Consult Note (Signed)
[facility] Face-to-Face Psychiatry Consult   Reason for Consult:  Consult for this 24 year old woman with a history of schizoaffective disorder. Comes to the hospital after swallowing a safety pin Referring Physician:  McLaurin Patient Identification: Melanie Bell MRN:  [id number] Principal Diagnosis: H/O foreign body ingestion Diagnosis:   Patient Active Problem List   Diagnosis Date Noted  . Schizoaffective disorder, bipolar type [F25.0] 11/06/2014  . Tardive dyskinesia [G24.01] 10/06/2014  . Tobacco use disorder [Z72.0] 09/30/2014  . H/O foreign body ingestion [Z87.821] 09/29/2014  . Hypothyroid [E03.9] 09/29/2014  . Hypertension [I10] 09/29/2014  . Constipation [K59.00] 09/29/2014    Total Time spent with patient: 45 minutes  Subjective:   Melanie Bell is a 24 y.o. female patient admitted with "I swallowed a safety pin".  HPI:  Information from the patient and the chart. This 24 year old woman told the staff at her group home this afternoon that she had swallowed a safety pin. She tells [name] that she swallowed an open safety pin. It was a fairly impulsive thing because she was feeling frustrated. She has chronic low mood. Transiently was having suicidal thoughts but immediately afterwards changed her mind and told staff to bring her to the hospital. She is currently denying any suicidal wishes. She has not had any return of hallucinations recently. She reports being irritated by the people she has to live with at the group home and by the way the staff treat her.  Past psychiatric history: Schizoaffective disorder long history of illness. Multiple hospitalizations. Has a history of ingesting foreign bodies in the past. Also has a history of suicidal ideation. She was just recently discharged. She is supposed to be following up with an outpatient provider.  Social history: Resides in a group home. Her mother lives out of state and has been ill recently which is been a stress for  her.  Medical history: History of hypothyroidism high blood pressure tardive dyskinesia constipation and foreign body ingestion.  Substance abuse history: Does not drink or abuse drugs other than cigarettes.  Family history: Nonidentified HPI Elements:   Quality:  Depression and foreign body ingestion. Severity:  Suicidal and potentially life threatening. Timing:  Happen this afternoon. Duration:  Transient. Context:  Ongoing frustration.  Past Medical History:  Past Medical History  Diagnosis Date  . Hallucinations 09/30/2014    Sizoaffective  . Depression   . Anxiety   . Hypertension   . Tardive dyskinesia 10/2014    recent onset  . GERD (gastroesophageal reflux disease)     Past Surgical History  Procedure Laterality Date  . Breast lumpectomy    . Wisdom tooth extraction    . Abdominal surgery      "years ago" to remove foreign objects   Family History:  Family History  Problem Relation Age of Onset  . Depression Mother   . Hypertension Mother    Social History:  History  Alcohol Use No     History  Drug Use No    History   Social History  . Marital Status: Single    Spouse Name: N/A  . Number of Children: N/A  . Years of Education: N/A   Social History Main Topics  . Smoking status: Current Every Day Smoker -- 0.50 packs/day for 3 years    Types: Cigarettes  . Smokeless tobacco: Not on file  . Alcohol Use: No  . Drug Use: No  . Sexual Activity: No   Other Topics Concern  . None  Social History Narrative   Additional Social History:                          Allergies:   Allergies  Allergen Reactions  . Betadine [Povidone Iodine]   . Shellfish-Derived Products   . Iodine Rash    Labs:  Results for orders placed or performed during the hospital encounter of 11/27/14 (from the past 48 hour(s))  Comprehensive metabolic panel     Status: Abnormal   Collection Time: 11/27/14  4:33 PM  Result Value Ref Range   Sodium 135 135 - 145  mmol/L   Potassium 3.5 3.5 - 5.1 mmol/L   Chloride 102 101 - 111 mmol/L   CO2 25 22 - 32 mmol/L   Glucose, Bld 93 65 - 99 mg/dL   BUN 7 6 - 20 mg/dL   Creatinine, Ser 0.67 0.44 - 1.00 mg/dL   Calcium 9.6 8.9 - 10.3 mg/dL   Total Protein 7.3 6.5 - 8.1 g/dL   Albumin 4.4 3.5 - 5.0 g/dL   AST 16 15 - 41 U/L   ALT 7 (L) 14 - 54 U/L   Alkaline Phosphatase 74 38 - 126 U/L   Total Bilirubin 0.3 0.3 - 1.2 mg/dL   GFR calc non Af Amer >60 >60 mL/min   GFR calc Af Amer >60 >60 mL/min    Comment: (NOTE) The eGFR has been calculated using the CKD EPI equation. This calculation has not been validated in all clinical situations. eGFR's persistently <60 mL/min signify possible Chronic Kidney Disease.    Anion gap 8 5 - 15  Ethanol (ETOH)     Status: None   Collection Time: 11/27/14  4:33 PM  Result Value Ref Range   Alcohol, Ethyl (B) <5 <5 mg/dL    Comment:        LOWEST DETECTABLE LIMIT FOR SERUM ALCOHOL IS 5 mg/dL FOR MEDICAL PURPOSES ONLY   CBC     Status: None   Collection Time: 11/27/14  4:33 PM  Result Value Ref Range   WBC 7.6 3.6 - 11.0 K/uL   RBC 4.08 3.80 - 5.20 MIL/uL   Hemoglobin 12.5 12.0 - 16.0 g/dL   HCT 37.5 35.0 - 47.0 %   MCV 92.0 80.0 - 100.0 fL   MCH 30.6 26.0 - 34.0 pg   MCHC 33.3 32.0 - 36.0 g/dL   RDW 13.3 11.5 - 14.5 %   Platelets 180 150 - 440 K/uL  Urine Drug Screen, Qualitative ([facility] only)     Status: None   Collection Time: 11/27/14  4:33 PM  Result Value Ref Range   Tricyclic, Ur Screen NONE DETECTED NONE DETECTED   Amphetamines, Ur Screen NONE DETECTED NONE DETECTED   MDMA (Ecstasy)Ur Screen NONE DETECTED NONE DETECTED   Cocaine Metabolite,Ur [place] NONE DETECTED NONE DETECTED   Opiate, Ur Screen NONE DETECTED NONE DETECTED   Phencyclidine (PCP) Ur S NONE DETECTED NONE DETECTED   Cannabinoid 50 Ng, Ur [place] NONE DETECTED NONE DETECTED   Barbiturates, Ur Screen NONE DETECTED NONE DETECTED   Benzodiazepine, Ur Scrn NONE DETECTED NONE DETECTED   Methadone  Scn, Ur NONE DETECTED NONE DETECTED    Comment: (NOTE) 607  Tricyclics, urine               Cutoff 1000 ng/mL 200  Amphetamines, urine             Cutoff 1000 ng/mL 300  MDMA (Ecstasy), urine  Cutoff 500 ng/mL 400  Cocaine Metabolite, urine       Cutoff 300 ng/mL 500  Opiate, urine                   Cutoff 300 ng/mL 600  Phencyclidine (PCP), urine      Cutoff 25 ng/mL 700  Cannabinoid, urine              Cutoff 50 ng/mL 800  Barbiturates, urine             Cutoff 200 ng/mL 900  Benzodiazepine, urine           Cutoff 200 ng/mL 1000 Methadone, urine                Cutoff 300 ng/mL 1100 1200 The urine drug screen provides only a preliminary, unconfirmed 1300 analytical test result and should not be used for non-medical 1400 purposes. Clinical consideration and professional judgment should 1500 be applied to any positive drug screen result due to possible 1600 interfering substances. A more specific alternate chemical method 1700 must be used in order to obtain a confirmed analytical result.  1800 Gas chromato graphy / mass spectrometry (GC/MS) is the preferred 1900 confirmatory method.     Vitals: Blood pressure 148/88, pulse 67, temperature 98.2 F (36.8 C), temperature source Oral, resp. rate 18, height 5' 6" (1.676 m), weight 99.791 kg (220 lb), SpO2 100 %.  Risk to Self: Is patient at risk for suicide?: Yes Risk to Others:   Prior Inpatient Therapy:   Prior Outpatient Therapy:    No current facility-administered medications for this encounter.   Current Outpatient Prescriptions  Medication Sig Dispense Refill  . amLODipine (NORVASC) 2.5 MG tablet Take 1 tablet (2.5 mg total) by mouth daily. 30 tablet 0  . benztropine (COGENTIN) 1 MG tablet Take 1 tablet (1 mg total) by mouth at bedtime. 30 tablet 0  . docusate sodium (COLACE) 100 MG capsule Take 2 capsules (200 mg total) by mouth 2 (two) times daily. 120 capsule 0  . fluPHENAZine (PROLIXIN) 10 MG tablet Take 1 tablet  (10 mg total) by mouth at bedtime. 30 tablet 0  . fluPHENAZine (PROLIXIN) 5 MG tablet Take 1 tablet (5 mg total) by mouth daily. At 4 pm 60 tablet 0  . levothyroxine (SYNTHROID, LEVOTHROID) 75 MCG tablet Take 1 tablet (75 mcg total) by mouth daily before breakfast. 30 tablet 0  . lithium carbonate (LITHOBID) 300 MG CR tablet Take 1 tablet (300 mg total) by mouth every morning. 30 tablet 0  . lithium carbonate (LITHOBID) 300 MG CR tablet Take 2 tablets (600 mg total) by mouth at bedtime. 60 tablet 0  . lurasidone (LATUDA) 80 MG TABS tablet Take 1 tablet (80 mg total) by mouth daily with supper. (Patient taking differently: Take 80 mg by mouth at bedtime. ) 30 tablet 0  . OXcarbazepine (TRILEPTAL) 150 MG tablet Take 1 tablet (150 mg total) by mouth 2 (two) times daily. 60 tablet 0  . senna (SENOKOT) 8.6 MG TABS tablet Take 2 tablets (17.2 mg total) by mouth at bedtime. 60 each 0  . traZODone (DESYREL) 50 MG tablet Take 1 tablet (50 mg total) by mouth at bedtime as needed for sleep. 30 tablet 0  . venlafaxine XR (EFFEXOR-XR) 150 MG 24 hr capsule Take 1 capsule (150 mg total) by mouth daily with breakfast. 30 capsule 0    Musculoskeletal: Strength & Muscle Tone: within normal limits Gait & Station: normal Patient leans:  N/A  Psychiatric Specialty Exam: Physical Exam  Constitutional: She appears well-developed and well-nourished.  HENT:  Head: Normocephalic and atraumatic.  Eyes: Conjunctivae are normal. Pupils are equal, round, and reactive to light.  Neck: Normal range of motion.  Cardiovascular: Normal heart sounds.   Respiratory: Effort normal.  GI: Soft.  Musculoskeletal: Normal range of motion.  Neurological: She is alert.  Skin: Skin is warm and dry.  Psychiatric: Thought content normal. Her speech is delayed. She is slowed. Cognition and memory are impaired. She expresses impulsivity. She exhibits a depressed mood.    Review of Systems  Constitutional: Negative.   HENT: Negative.    Eyes: Negative.   Respiratory: Negative.   Cardiovascular: Negative.   Gastrointestinal: Negative.   Musculoskeletal: Negative.   Skin: Negative.   Neurological: Negative.   Psychiatric/Behavioral: Positive for depression and suicidal ideas. Negative for hallucinations and substance abuse. The patient is nervous/anxious and has insomnia.     Blood pressure 148/88, pulse 67, temperature 98.2 F (36.8 C), temperature source Oral, resp. rate 18, height 5' 6" (1.676 m), weight 99.791 kg (220 lb), SpO2 100 %.Body mass index is 35.53 kg/(m^2).  General Appearance: Fairly Groomed  Engineer, water::  Good  Speech:  Clear and Coherent  Volume:  Decreased  Mood:  Euthymic  Affect:  Flat  Thought Process:  Linear  Orientation:  Full (Time, Place, and Person)  Thought Content:  Negative  Suicidal Thoughts:  No  Homicidal Thoughts:  No  Memory:  Immediate;   Fair Recent;   Fair Remote;   Fair  Judgement:  Impaired  Insight:  Shallow  Psychomotor Activity:  Normal  Concentration:  Fair  Recall:  [employer] [place]  Language: Fair  Akathisia:  No  Handed:  Right  AIMS (if indicated):     Assets:  Communication Skills Desire for Improvement Resilience  ADL's:  Intact  Cognition: WNL  Sleep:      Medical Decision Making: Review of Psycho-Social Stressors (1), Review or order clinical lab tests (1), Established Problem, Worsening (2), Review of Medication Regimen & Side Effects (2) and Review of New Medication or Change in Dosage (2)  Treatment Plan Summary: Medication management and Plan Patient has had an x-ray now which reveals that she is telling the truth. There is a new open safety pin in her stomach. I will make sure she continues on her usual psychiatric medicine. We will follow as needed in the hospital. At this point I doubt that she will need return admission to the psychiatry ward but we will see how she is doing after medicine and surgery evaluate her. Supportive  counseling done with the patient.   Plan:  Supportive therapy provided about ongoing stressors. Discussed crisis plan, support from social network, calling 911, coming to the Emergency Department, and calling Suicide Hotline. Disposition: Continue to monitor in the hospital. Does not need to have a disposition psychiatrically yet until the medical issues are dealt with.  John [name] 11/27/2014 7:33 PM

## 2014-11-27 NOTE — H&P (Signed)
[place] [facility] [place] [name]    MR#:  [id number]  DATE OF BIRTH:  02-11-1991  DATE OF ADMISSION:  11/27/2014  PRIMARY CARE PHYSICIAN: JADALI,FAYEGH, MD   REQUESTING/REFERRING PHYSICIAN: Dr. Robet Leu  CHIEF COMPLAINT:   Chief Complaint  Patient presents with  . Suicidal    HISTORY OF PRESENT ILLNESS:  Melanie Bell is a 24 y.o. female history of schizoaffective disorder and multiple psychiatric visits, several for swallowing open safety pins, who presents from her group home after she swallowed a safety pin around 4 PM today. She mentions she took the safety pin as an impulse to seek attention. No suicidal ideation. Abdominal pain or nausea. She had a [place] sandwich earlier. Abdominal x-ray showed an open safety pin in the stomach. She has had multiple similar presentations in the past.  PAST MEDICAL HISTORY:   Past Medical History  Diagnosis Date  . Hallucinations 09/30/2014    Sizoaffective  . Depression   . Anxiety   . Hypertension   . Tardive dyskinesia 10/2014    recent onset  . GERD (gastroesophageal reflux disease)     PAST SURGICAL HISTORY:   Past Surgical History  Procedure Laterality Date  . Breast lumpectomy    . Wisdom tooth extraction    . Abdominal surgery      "years ago" to remove foreign objects    SOCIAL HISTORY:   History  Substance Use Topics  . Smoking status: Current Every Day Smoker -- 0.50 packs/day for 3 years    Types: Cigarettes  . Smokeless tobacco: Not on file  . Alcohol Use: No    FAMILY HISTORY:   Family History  Problem Relation Age of Onset  . Depression Mother   . Hypertension Mother     DRUG ALLERGIES:   Allergies  Allergen Reactions  . Betadine [Povidone Iodine] Other (See Comments)    Reaction:  Unknown   . Shellfish-Derived Products Other (See Comments)    Reaction:  Unknown   . Iodine Rash    REVIEW OF SYSTEMS:   Review of Systems   Constitutional: Positive for malaise/fatigue. Negative for fever, chills and weight loss.  HENT: Negative for hearing loss and nosebleeds.   Eyes: Negative for blurred vision, double vision and pain.  Respiratory: Negative for cough, hemoptysis, sputum production, shortness of breath and wheezing.   Cardiovascular: Negative for chest pain, palpitations, orthopnea and leg swelling.  Gastrointestinal: Negative for nausea, vomiting, abdominal pain, diarrhea and constipation.  Genitourinary: Negative for dysuria and hematuria.  Musculoskeletal: Negative for myalgias, back pain and falls.  Skin: Negative for rash.  Neurological: Negative for dizziness, tremors, sensory change, speech change, focal weakness, seizures and headaches.  Endo/Heme/Allergies: Does not bruise/bleed easily.  Psychiatric/Behavioral: Positive for depression. Negative for memory loss. The patient is nervous/anxious.     MEDICATIONS AT HOME:   Prior to Admission medications   Medication Sig Start Date End Date Taking? Authorizing Provider  amLODipine (NORVASC) 2.5 MG tablet Take 1 tablet (2.5 mg total) by mouth daily. 11/10/14  Yes [name], MD  benztropine (COGENTIN) 1 MG tablet Take 1 tablet (1 mg total) by mouth at bedtime. 10/06/14  Yes [name], MD  docusate sodium (COLACE) 100 MG capsule Take 2 capsules (200 mg total) by mouth 2 (two) times daily. 10/06/14  Yes [name], MD  fluPHENAZine (PROLIXIN) 10 MG tablet Take 1 tablet (10 mg total) by mouth at bedtime. 11/09/14  Yes Seth Bake  Hernandez-Gonzalez, MD  fluPHENAZine (PROLIXIN) 5 MG tablet Take 1 tablet (5 mg total) by mouth daily. At 4 pm 11/09/14  Yes [name], MD  levothyroxine (SYNTHROID, LEVOTHROID) 75 MCG tablet Take 1 tablet (75 mcg total) by mouth daily before breakfast. 10/06/14  Yes [name], MD  lurasidone (LATUDA) 80 MG TABS tablet Take 1 tablet (80 mg total) by mouth daily with  supper. 10/06/14  Yes [name], MD  OXcarbazepine (TRILEPTAL) 150 MG tablet Take 1 tablet (150 mg total) by mouth 2 (two) times daily. 10/06/14  Yes [name], MD  senna (SENOKOT) 8.6 MG TABS tablet Take 2 tablets (17.2 mg total) by mouth at bedtime. 11/09/14  Yes [name], MD  traZODone (DESYREL) 50 MG tablet Take 1 tablet (50 mg total) by mouth at bedtime as needed for sleep. 11/09/14  Yes [name], MD  venlafaxine XR (EFFEXOR-XR) 150 MG 24 hr capsule Take 1 capsule (150 mg total) by mouth daily with breakfast. 10/06/14  Yes [name], MD  lithium carbonate (LITHOBID) 300 MG CR tablet Take 1 tablet (300 mg total) by mouth every morning. Patient not taking: Reported on 11/27/2014 10/06/14   [name], MD  lithium carbonate (LITHOBID) 300 MG CR tablet Take 2 tablets (600 mg total) by mouth at bedtime. Patient not taking: Reported on 11/27/2014 10/06/14   [name], MD      VITAL SIGNS:  Blood pressure 103/57, pulse 63, temperature 98.1 F (36.7 C), temperature source Oral, resp. rate 18, height 5\' 6"  (1.676 m), weight 99.791 kg (220 lb), SpO2 100 %.  PHYSICAL EXAMINATION:  Physical Exam  GENERAL:  24 y.o.-year-old patient lying in the bed with no acute distress.  EYES: Pupils equal, round, reactive to light and accommodation. No scleral icterus. Extraocular muscles intact.  HEENT: Head atraumatic, normocephalic. Oropharynx and nasopharynx clear. No oropharyngeal erythema, moist oral mucosa  NECK:  Supple, no jugular venous distention. No thyroid enlargement, no tenderness.  LUNGS: Normal breath sounds bilaterally, no wheezing, rales, rhonchi. No use of accessory muscles of respiration.  CARDIOVASCULAR: S1, S2 normal. No murmurs, rubs, or gallops.  ABDOMEN: Soft, nontender, nondistended. Bowel sounds present. No organomegaly or mass.  EXTREMITIES: No pedal edema, cyanosis, or clubbing. +  2 pedal & radial pulses b/l.   NEUROLOGIC: Cranial nerves II through XII are intact. No focal Motor or sensory deficits appreciated b/l PSYCHIATRIC: The patient is alert and oriented x 3. Depressed. SKIN: No obvious rash, lesion, or ulcer.   LABORATORY PANEL:   CBC  Recent Labs Lab 11/27/14 1633  WBC 7.6  HGB 12.5  HCT 37.5  PLT 180   ------------------------------------------------------------------------------------------------------------------  Chemistries   Recent Labs Lab 11/27/14 1633  NA 135  K 3.5  CL 102  CO2 25  GLUCOSE 93  BUN 7  CREATININE 0.67  CALCIUM 9.6  AST 16  ALT 7*  ALKPHOS 74  BILITOT 0.3   ------------------------------------------------------------------------------------------------------------------  Cardiac Enzymes No results for input(s): TROPONINI in the last 168 hours. ------------------------------------------------------------------------------------------------------------------  RADIOLOGY:  Dg Chest 2 View  11/27/2014   CLINICAL DATA:  Swallowed safety pin  EXAM: CHEST - 2 VIEW  COMPARISON:  09/29/2014, 11/27/2014  FINDINGS: Cardiac shadow is within normal limits. The lungs are well aerated bilaterally. No focal infiltrate or sizable effusion is seen. The known safety pin within the stomach is not appreciated on this exam. A 3 linear metallic density seen on the prior abdominal film the project in the soft tissues of the anterior chest wall  in the midline and are likely subcutaneous in nature. No other focal abnormality is noted.  IMPRESSION: The previously described linear metallic densities lie within the subcutaneous tissues of the anterior chest in the midline. No other focal abnormality is seen.   Electronically Signed   By: Inez Catalina M.D.   On: 11/27/2014 19:01   Dg Abd 1 View  11/27/2014   CLINICAL DATA:  Patient swallowed open safety pin  EXAM: ABDOMEN - 1 VIEW  COMPARISON:  November 06, 2014  FINDINGS: There is an open safety  pin in the body of the stomach on the left. There is moderate stool throughout the colon. There is no bowel dilatation or air-fluid level suggesting obstruction. No free air. Lung bases are clear.  There are 3 linear metallic foreign bodies overlying the lower thoracic spine in the midline of the lower chest region.  IMPRESSION: Open safety pin within stomach. Bowel gas pattern unremarkable. No free air. There are 3 linear metallic foreign bodies overlying the lower thoracic region of uncertain etiology. These linear metallic foreign bodies potentially could be located within the distal esophagus. PA and lateral chest radiograph could be helpful in this regard for further localization.  These results were called by telephone at the time of interpretation on 11/27/2014 at 6:44 pm to Dr. Ponciano Ort , who verbally acknowledged these results.   Electronically Signed   By: Lowella Grip III M.D.   On: 11/27/2014 18:44     IMPRESSION AND PLAN:   24 f here after swallowing open safety pin  * Foreign body ingestion Open safety pin found in the stomach. Discussed with Dr. Vira Agar. He did not feel an EGD at this time would help as patient just ate a [place] sandwich. Plan is to admit patient under observation and scheduled for EGD tomorrow morning. We will get a repeat abdominal x-ray tomorrow at 6 AM. If it passes through the stomach then we will need to get serial x-rays ~safety been passes. If any acute worsening will need surgical intervention. This plan has been discussed with Dr. Vira Agar and emergency room physician.  * Bipolar and depression Continue Home medications Seen by Dr. Weber Cooks of psychiatry in the emergency room. Patient is not suicidal.  * DVT prophylaxis SCDs    All the records are reviewed and case discussed with ED provider. Management plans discussed with the patient, family and they are in agreement.  CODE STATUS: FULL  TOTAL TIME TAKING CARE OF THIS PATIENT: 35 minutes.     Hillary Bow R M.D on 11/27/2014 at 8:36 PM  Between 7am to 6pm - Pager - [phone]  After 6pm go to [email or web address] - password [email or web address] [email or web address]  Office  [phone]  CC: Primary care physician; [facility], MD

## 2014-11-27 NOTE — ED Notes (Signed)

## 2014-11-27 NOTE — ED Notes (Signed)
CSW Margaret at bedside at this time

## 2014-11-27 NOTE — ED Notes (Addendum)
BEHAVIORAL HEALTH ROUNDING Patient sleeping: No. Patient alert and oriented: yes Behavior appropriate: Yes.  ;  Nutrition and fluids offered: Yes  Toileting and hygiene offered: Yes  Sitter present: yes Law enforcement present: Yes  

## 2014-11-27 NOTE — ED Notes (Signed)
Pt ambulatory to bathroom at this time, no concerns noted. Tolerated well. Pt calm and cooperative.

## 2014-11-27 NOTE — ED Notes (Signed)
BEHAVIORAL HEALTH ROUNDING Patient sleeping: No. Patient alert and oriented: no Behavior appropriate: Yes.  ;  Nutrition and fluids offered: Yes  Toileting and hygiene offered: Yes  Sitter present: yes Law enforcement present: Yes

## 2014-11-27 NOTE — ED Notes (Signed)
Pt states she swallowed an open safety pin.  States she is tired of the group home complaining on her.  Group home rep told RN that pt did not swallow pin, pt stated to her that she "wanted to come to the ER bc she gets all the soda she wants and cheese burgers and fries for dinner"
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
## 2014-11-27 NOTE — ED Notes (Signed)
Pt returned from xray at this time.

## 2014-11-27 NOTE — ED Notes (Signed)
Pt to Xray at this time

## 2014-11-27 NOTE — ED Notes (Signed)
BEHAVIORAL HEALTH ROUNDING Patient sleeping: No. Patient alert and oriented: yes Behavior appropriate: Yes.  ;  Nutrition and fluids offered: Yes  Toileting and hygiene offered: Yes  Sitter present: yes Law enforcement present: Yes  

## 2014-11-27 NOTE — ED Notes (Signed)
MD Mclaurin and [place] at bedside at this time.

## 2014-11-27 NOTE — ED Notes (Signed)
MD Tiffany Kocher at bedside at this time
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
## 2014-11-28 ENCOUNTER — Encounter
Admission: EM | Disposition: A | Discharge status: Home / Self Care | Payer: Self-pay | Source: Home / Self Care | Attending: Emergency Medicine

## 2014-11-28 ENCOUNTER — Encounter: Payer: Self-pay | Admitting: Anesthesiology

## 2014-11-28 ENCOUNTER — Observation Stay: Payer: Medicaid Other | Admitting: Anesthesiology

## 2014-11-28 ENCOUNTER — Observation Stay: Payer: Medicaid Other

## 2014-11-28 DIAGNOSIS — Z87821 Personal history of retained foreign body fully removed: Secondary | ICD-10-CM | POA: Diagnosis not present

## 2014-11-28 HISTORY — PX: ESOPHAGOGASTRODUODENOSCOPY: SHX5428

## 2014-11-28 LAB — TSH: TSH: 1.084 u[IU]/mL (ref 0.350–4.500)

## 2014-11-28 SURGERY — EGD (ESOPHAGOGASTRODUODENOSCOPY)
Anesthesia: General

## 2014-11-28 MED ORDER — LIDOCAINE HCL (CARDIAC) 20 MG/ML IV SOLN
INTRAVENOUS | Status: DC | PRN
  Administered 2014-11-28: 30 mg via INTRAVENOUS

## 2014-11-28 MED ORDER — ONDANSETRON HCL 4 MG/2ML IJ SOLN: 4.0000 mg | Freq: Once | INTRAMUSCULAR | Status: DC | PRN

## 2014-11-28 MED ORDER — FENTANYL CITRATE (PF) 100 MCG/2ML IJ SOLN
INTRAMUSCULAR | Status: DC | PRN
  Administered 2014-11-28: 50 ug via INTRAVENOUS

## 2014-11-28 MED ORDER — FENTANYL CITRATE (PF) 100 MCG/2ML IJ SOLN: 25.0000 ug | INTRAMUSCULAR | Status: DC | PRN

## 2014-11-28 MED ORDER — PROPOFOL 10 MG/ML IV BOLUS
INTRAVENOUS | Status: DC | PRN
  Administered 2014-11-28: 150 mg via INTRAVENOUS
  Administered 2014-11-28: 50 mg via INTRAVENOUS

## 2014-11-28 MED ORDER — SUCCINYLCHOLINE CHLORIDE 20 MG/ML IJ SOLN
INTRAMUSCULAR | Status: DC | PRN
  Administered 2014-11-28: 100 mg via INTRAVENOUS

## 2014-11-28 MED ORDER — GLYCOPYRROLATE 0.2 MG/ML IJ SOLN
INTRAMUSCULAR | Status: DC | PRN
  Administered 2014-11-28: .2 mg via INTRAVENOUS

## 2014-11-28 MED ORDER — MIDAZOLAM HCL 2 MG/2ML IJ SOLN
INTRAMUSCULAR | Status: DC | PRN
  Administered 2014-11-28: 2 mg via INTRAVENOUS

## 2014-11-28 MED ORDER — SODIUM CHLORIDE 0.9 % IV SOLN
INTRAVENOUS | Status: DC
  Administered 2014-11-28: 1000 mL via INTRAVENOUS
  Administered 2014-11-28: 11:00:00 via INTRAVENOUS

## 2014-11-28 NOTE — Consult Note (Signed)
Pt safety pin removed from duodenal 2ed portion  During endoscopy.  No further GI recommendations. Pt can be discharged if no other pertinent problems.

## 2014-11-28 NOTE — Consult Note (Signed)
EGD done with successful removal of safety pin from second portion of duodenum.  May have regular food and be discharged when you feel appropriate.

## 2014-11-28 NOTE — Clinical Social Work Note (Signed)
Patient has been cleared by psychiatry and medical and has been discharged to go back to the group home today. CSW was able to reach [facility] and she is going to come and pick patient up. Patient's nurse is aware and that Ms. [name] Hammers stated it would be 5:30 before she comes. MD is to do the discharge summary and nursing is aware that once done, to place in the packet.  Shela Leff MSW,LCSWA [phone]

## 2014-11-28 NOTE — Consult Note (Signed)
[facility] Consult   Reason for Consult:  Follow-up consult for 24 year old woman with schizoaffective disorder who had swallowed a safety pin Referring Physician:  Johnette Abraham Patient Identification: Melanie Bell MRN:  [id number] Principal Diagnosis: H/O foreign body ingestion Diagnosis:   Patient Active Problem List   Diagnosis Date Noted  . 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 Schizoaffective disorder, bipolar type [F25.0] 11/06/2014  . Tardive dyskinesia [G24.01] 10/06/2014  . Tobacco use disorder [Z72.0] 09/30/2014  . H/O foreign body ingestion [Z87.821] 09/29/2014  . Hypothyroid [E03.9] 09/29/2014  . Hypertension [I10] 09/29/2014  . Constipation [K59.00] 09/29/2014    Total Time spent with patient: 30 minutes  Subjective:   Melanie Bell is a 24 y.o. female patient admitted with "I'm all right".  HPI:  Follow-up for this patient with schizoaffective disorder who swallowed a safety pin yesterday. Today she had a endoscopy which was able to successfully remove the pen. She is no longer requiring medical hospitalization. On interview the patient says that her mood is stable about the same as usual. She denies any wish to die and denies any plan or thought of ingesting any foreign bodies or trying to hurt her self. She shows some insight in being able to discuss the inappropriateness of her behavior and the need for her to find new ways to cope with her stress. Patient does not appear to be acutely psychotic and denies suicidal thoughts. Affect is at baseline. Patient is unlikely to benefit from hospitalization on the psychiatry service. HPI Elements:   Quality:  Impulsive behavior. Severity:  Potentially severe. Timing:  2 days ago. Duration:  Chronic recurrent condition. Context:  Frustration with how she is treated at the group home.  Past Medical History:  Past Medical History  Diagnosis Date  . Hallucinations 09/30/2014    Sizoaffective   . Depression   . Anxiety   . Hypertension   . Tardive dyskinesia 10/2014    recent onset  . GERD (gastroesophageal reflux disease)     Past Surgical History  Procedure Laterality Date  . Breast lumpectomy    . Wisdom tooth extraction    . Abdominal surgery      "years ago" to remove foreign objects   Family History:  Family History  Problem Relation Age of Onset  . Depression Mother   . Hypertension Mother    Social History:  History  Alcohol Use No     History  Drug Use No    History   Social History  . Marital Status: Single    Spouse Name: N/A  . Number of Children: N/A  . Years of Education: N/A   Social History Main Topics  . Smoking status: Current Every Day Smoker -- 0.50 packs/day for 3 years    Types: Cigarettes  . Smokeless tobacco: Not on file  . Alcohol Use: No  . Drug Use: No  . Sexual Activity: No   Other Topics Concern  . None   Social History Narrative   Additional Social History:                          Allergies:   Allergies  Allergen Reactions  . Betadine [Povidone Iodine] Other (See Comments)    Reaction:  Unknown   . Shellfish-Derived Products Other (See Comments)    Reaction:  Unknown   . Iodine Rash    Labs:  Results for orders placed or performed during the  hospital encounter of 11/27/14 (from the past 48 hour(s))  Comprehensive metabolic panel     Status: Abnormal   Collection Time: 11/27/14  4:33 PM  Result Value Ref Range   Sodium 135 135 - 145 mmol/L   Potassium 3.5 3.5 - 5.1 mmol/L   Chloride 102 101 - 111 mmol/L   CO2 25 22 - 32 mmol/L   Glucose, Bld 93 65 - 99 mg/dL   BUN 7 6 - 20 mg/dL   Creatinine, Ser 0.67 0.44 - 1.00 mg/dL   Calcium 9.6 8.9 - 10.3 mg/dL   Total Protein 7.3 6.5 - 8.1 g/dL   Albumin 4.4 3.5 - 5.0 g/dL   AST 16 15 - 41 U/L   ALT 7 (L) 14 - 54 U/L   Alkaline Phosphatase 74 38 - 126 U/L   Total Bilirubin 0.3 0.3 - 1.2 mg/dL   GFR calc non Af Amer >60 >60 mL/min   GFR calc Af  Amer >60 >60 mL/min    Comment: (NOTE) The eGFR has been calculated using the CKD EPI equation. This calculation has not been validated in all clinical situations. eGFR's persistently <60 mL/min signify possible Chronic Kidney Disease.    Anion gap 8 5 - 15  Ethanol (ETOH)     Status: None   Collection Time: 11/27/14  4:33 PM  Result Value Ref Range   Alcohol, Ethyl (B) <5 <5 mg/dL    Comment:        LOWEST DETECTABLE LIMIT FOR SERUM ALCOHOL IS 5 mg/dL FOR MEDICAL PURPOSES ONLY   CBC     Status: None   Collection Time: 11/27/14  4:33 PM  Result Value Ref Range   WBC 7.6 3.6 - 11.0 K/uL   RBC 4.08 3.80 - 5.20 MIL/uL   Hemoglobin 12.5 12.0 - 16.0 g/dL   HCT 37.5 35.0 - 47.0 %   MCV 92.0 80.0 - 100.0 fL   MCH 30.6 26.0 - 34.0 pg   MCHC 33.3 32.0 - 36.0 g/dL   RDW 13.3 11.5 - 14.5 %   Platelets 180 150 - 440 K/uL  Urine Drug Screen, Qualitative ([facility] only)     Status: None   Collection Time: 11/27/14  4:33 PM  Result Value Ref Range   Tricyclic, Ur Screen NONE DETECTED NONE DETECTED   Amphetamines, Ur Screen NONE DETECTED NONE DETECTED   MDMA (Ecstasy)Ur Screen NONE DETECTED NONE DETECTED   Cocaine Metabolite,Ur [place] NONE DETECTED NONE DETECTED   Opiate, Ur Screen NONE DETECTED NONE DETECTED   Phencyclidine (PCP) Ur S NONE DETECTED NONE DETECTED   Cannabinoid 50 Ng, Ur [place] NONE DETECTED NONE DETECTED   Barbiturates, Ur Screen NONE DETECTED NONE DETECTED   Benzodiazepine, Ur Scrn NONE DETECTED NONE DETECTED   Methadone Scn, Ur NONE DETECTED NONE DETECTED    Comment: (NOTE) 419  Tricyclics, urine               Cutoff 1000 ng/mL 200  Amphetamines, urine             Cutoff 1000 ng/mL 300  MDMA (Ecstasy), urine           Cutoff 500 ng/mL 400  Cocaine Metabolite, urine       Cutoff 300 ng/mL 500  Opiate, urine                   Cutoff 300 ng/mL 600  Phencyclidine (PCP), urine      Cutoff 25 ng/mL 700  Cannabinoid, urine  Cutoff 50 ng/mL 800  Barbiturates, urine              Cutoff 200 ng/mL 900  Benzodiazepine, urine           Cutoff 200 ng/mL 1000 Methadone, urine                Cutoff 300 ng/mL 1100 1200 The urine drug screen provides only a preliminary, unconfirmed 1300 analytical test result and should not be used for non-medical 1400 purposes. Clinical consideration and professional judgment should 1500 be applied to any positive drug screen result due to possible 1600 interfering substances. A more specific alternate chemical method 1700 must be used in order to obtain a confirmed analytical result.  1800 Gas chromato graphy / mass spectrometry (GC/MS) is the preferred 1900 confirmatory method.   TSH     Status: None   Collection Time: 11/27/14  4:33 PM  Result Value Ref Range   TSH 1.084 0.350 - 4.500 uIU/mL    Vitals: Blood pressure 131/69, pulse 78, temperature 98.5 F (36.9 C), temperature source Oral, resp. rate 16, height 5' 6"  (1.676 m), weight 99.791 kg (220 lb), SpO2 99 %.  Risk to Self: Suicidal Ideation: Yes-Currently Present Suicidal Intent: Yes-Currently Present Is patient at risk for suicide?: Yes Suicidal Plan?: Yes-Currently Present Specify Current Suicidal Plan: swallowed an open safety pin Access to Means: Yes Specify Access to Suicidal Means: currently--safety pin What has been your use of drugs/alcohol within the last 12 months?: none How many times?: 5 Other Self Harm Risks: threats of harm to self Triggers for Past Attempts: Other personal contacts, Unpredictable Intentional Self Injurious Behavior:  (swallowing objects) Risk to Others: Homicidal Ideation: No Thoughts of Harm to Others: No Current Homicidal Intent: No Current Homicidal Plan: No Access to Homicidal Means: No Identified Victim: none History of harm to others?: No Assessment of Violence: On admission Violent Behavior Description: none Does patient have access to weapons?: No Criminal Charges Pending?: No Does patient have a court date:  No Prior Inpatient Therapy: Prior Inpatient Therapy: Yes Prior Therapy Dates: varies; multiple Prior Therapy Facilty/Provider(s): [facility] Reason for Treatment: depression; Schizoaffective Prior Outpatient Therapy: Prior Outpatient Therapy: Yes Prior Therapy Dates: quarterly Prior Therapy Facilty/Provider(s): [facility] Reason for Treatment: Depression; Scizoaffective Does patient have an ACCT team?: Yes Does patient have Intensive In-House Services?  : No Does patient have [facility] services? : No Does patient have [facility] services?: No  Current Facility-Administered Medications  Medication Dose Route Frequency Provider Last Rate Last Dose  . acetaminophen (TYLENOL) tablet 650 mg  650 mg Oral Q6H PRN Hillary Bow, MD   650 mg at 11/27/14 2339   Or  . acetaminophen (TYLENOL) suppository 650 mg  650 mg Rectal Q6H PRN Srikar Sudini, MD      . albuterol (PROVENTIL) (2.5 MG/3ML) 0.083% nebulizer solution 2.5 mg  2.5 mg Nebulization Q2H PRN Srikar Sudini, MD      . amLODipine (NORVASC) tablet 2.5 mg  2.5 mg Oral Daily Srikar Sudini, MD   2.5 mg at 11/28/14 1642  . benztropine (COGENTIN) tablet 1 mg  1 mg Oral QHS Srikar Sudini, MD   1 mg at 11/28/14 0000  . docusate sodium (COLACE) capsule 200 mg  200 mg Oral BID Hillary Bow, MD   200 mg at 11/28/14 0000  . fluPHENAZine (PROLIXIN) tablet 10 mg  10 mg Oral QHS [name], MD   10 mg at 11/27/14 2337  . fluPHENAZine (PROLIXIN) tablet 10 mg  10 mg Oral QHS Hillary Bow, MD   10 mg at 11/28/14 0000  . fluPHENAZine (PROLIXIN) tablet 5 mg  5 mg Oral QPC lunch [name], MD   5 mg at 11/28/14 1643  . fluPHENAZine (PROLIXIN) tablet 5 mg  5 mg Oral Daily Hillary Bow, MD   5 mg at 11/28/14 0935  . levothyroxine (SYNTHROID, LEVOTHROID) tablet 75 mcg  75 mcg Oral QAC breakfast Hillary Bow, MD   75 mcg at 11/28/14 0749  . lithium carbonate (LITHOBID) CR tablet 300 mg  300 mg Oral q morning - 10a [name], MD   300 mg at 11/28/14 0935  . lithium  carbonate (LITHOBID) CR tablet 600 mg  600 mg Oral QHS [name], MD   600 mg at 11/27/14 2338  . lurasidone (LATUDA) tablet 80 mg  80 mg Oral Q supper [name], MD      . lurasidone (LATUDA) tablet 80 mg  80 mg Oral Q supper Hillary Bow, MD   80 mg at 11/28/14 1643  . ondansetron (ZOFRAN) tablet 4 mg  4 mg Oral Q6H PRN Hillary Bow, MD       Or  . ondansetron (ZOFRAN) injection 4 mg  4 mg Intravenous Q6H PRN Srikar Sudini, MD      . OXcarbazepine (TRILEPTAL) tablet 150 mg  150 mg Oral BID [name], MD   150 mg at 11/27/14 2338  . Oxcarbazepine (TRILEPTAL) tablet 150 mg  150 mg Oral BID Hillary Bow, MD   150 mg at 11/28/14 0000  . senna (SENOKOT) tablet 17.2 mg  2 tablet Oral QHS Srikar Sudini, MD   17.2 mg at 11/28/14 0000  . traZODone (DESYREL) tablet 50 mg  50 mg Oral QHS [name], MD   50 mg at 11/27/14 2338  . traZODone (DESYREL) tablet 50 mg  50 mg Oral QHS PRN Srikar Sudini, MD      . venlafaxine XR (EFFEXOR-XR) 24 hr capsule 150 mg  150 mg Oral Q breakfast [name], MD   150 mg at 11/28/14 0750  . venlafaxine XR (EFFEXOR-XR) 24 hr capsule 150 mg  150 mg Oral Q breakfast Hillary Bow, MD   150 mg at 11/28/14 8309   Current Outpatient Prescriptions  Medication Sig Dispense Refill  . amLODipine (NORVASC) 2.5 MG tablet Take 1 tablet (2.5 mg total) by mouth daily. 30 tablet 0  . benztropine (COGENTIN) 1 MG tablet Take 1 tablet (1 mg total) by mouth at bedtime. 30 tablet 0  . docusate sodium (COLACE) 100 MG capsule Take 2 capsules (200 mg total) by mouth 2 (two) times daily. 120 capsule 0  . fluPHENAZine (PROLIXIN) 10 MG tablet Take 1 tablet (10 mg total) by mouth at bedtime. 30 tablet 0  . fluPHENAZine (PROLIXIN) 5 MG tablet Take 1 tablet (5 mg total) by mouth daily. At 4 pm 60 tablet 0  . levothyroxine (SYNTHROID, LEVOTHROID) 75 MCG tablet Take 1 tablet (75 mcg total) by mouth daily before breakfast. 30 tablet 0  . lurasidone (LATUDA) 80 MG TABS tablet  Take 1 tablet (80 mg total) by mouth daily with supper. 30 tablet 0  . OXcarbazepine (TRILEPTAL) 150 MG tablet Take 1 tablet (150 mg total) by mouth 2 (two) times daily. 60 tablet 0  . senna (SENOKOT) 8.6 MG TABS tablet Take 2 tablets (17.2 mg total) by mouth at bedtime. 60 each 0  . traZODone (DESYREL) 50 MG tablet Take 1 tablet (  50 mg total) by mouth at bedtime as needed for sleep. 30 tablet 0  . venlafaxine XR (EFFEXOR-XR) 150 MG 24 hr capsule Take 1 capsule (150 mg total) by mouth daily with breakfast. 30 capsule 0    Musculoskeletal: Strength & Muscle Tone: within normal limits Gait & Station: normal Patient leans: N/A  Psychiatric Specialty Exam: Physical Exam  Constitutional: She appears well-developed and well-nourished.  HENT:  Head: Normocephalic and atraumatic.  Eyes: Conjunctivae are normal. Pupils are equal, round, and reactive to light.  Neck: Normal range of motion.  Cardiovascular: Normal heart sounds.   Respiratory: Effort normal.  GI: Soft.  Musculoskeletal: Normal range of motion.  Neurological: She is alert.  Skin: Skin is warm and dry.  Psychiatric: She has a normal mood and affect. Her behavior is normal. Thought content normal.    Review of Systems  Constitutional: Negative.   HENT: Negative.   Eyes: Negative.   Respiratory: Negative.   Cardiovascular: Negative.   Gastrointestinal: Negative.   Musculoskeletal: Negative.   Skin: Negative.   Neurological: Negative.   Psychiatric/Behavioral: Negative for depression, suicidal ideas, hallucinations, memory loss and substance abuse. The patient is nervous/anxious. The patient does not have insomnia.     Blood pressure 131/69, pulse 78, temperature 98.5 F (36.9 C), temperature source Oral, resp. rate 16, height 5' 6"  (1.676 m), weight 99.791 kg (220 lb), SpO2 99 %.Body mass index is 35.53 kg/(m^2).  General Appearance: Fairly Groomed  Engineer, water::  Fair  Speech:  Clear and Coherent  Volume:  Normal   Mood:  Euthymic  Affect:  Flat  Thought Process:  Linear  Orientation:  Full (Time, Place, and Person)  Thought Content:  Negative  Suicidal Thoughts:  No  Homicidal Thoughts:  No  Memory:  Immediate;   Good Recent;   Fair Remote;   Fair  Judgement:  Impaired  Insight:  Present  Psychomotor Activity:  Decreased  Concentration:  Fair  Recall:  [employer] of Knowledge:Fair  Language: Fair  Akathisia:  No  Handed:  Right  AIMS (if indicated):     Assets:  Desire for Improvement Resilience  ADL's:  Intact  Cognition: WNL  Sleep:      Medical Decision Making: Established Problem, Stable/Improving (1), Review of Psycho-Social Stressors (1) and Independent Review of image, tracing or specimen (2)  Treatment Plan Summary: Plan Patient appears to be at her baseline. Blunted affect. Denies suicidal ideation. Able to discuss appropriate plans for the future. Reasonable insight but continues to suffer from her impulsivity problem. Unlikely to benefit from psychiatric hospitalization. Case discussed with the attending medicine doctor in nursing. Patient will be recommended to be discharged from the emergency room. She will follow-up with her usual outpatient psychiatrist in the community. She is encouraged to try to look into getting a therapist as well. No change to medicine.  Plan:  Patient does not meet criteria for psychiatric inpatient admission. Supportive therapy provided about ongoing stressors. Discussed crisis plan, support from social network, calling 911, coming to the Emergency Department, and calling Suicide Hotline. Disposition: Recommend discharge. Patient is not under commitment. No new prescriptions written.  John [name] 11/28/2014 5:58 PM

## 2014-11-28 NOTE — Discharge Summary (Signed)
[facility] [place] [name]    MR#:  [id number]  DATE OF BIRTH:  09/19/1990  DATE OF ADMISSION:  11/27/2014 ADMITTING PHYSICIAN: Theodoro Grist, MD  DATE OF DISCHARGE: No discharge date for patient encounter.  PRIMARY CARE PHYSICIAN: JADALI,FAYEGH, MD     ADMISSION DIAGNOSIS:  Foreign body alimentary tract [T18.9XXA] Gastric foreign body, initial encounter [T18.2XXA]  DISCHARGE DIAGNOSIS:  Principal Problem:   H/O foreign body ingestion Active Problems:   Foreign body alimentary tract   Schizoaffective disorder, bipolar type   Hypothyroid   Hypertension   Constipation   SECONDARY DIAGNOSIS:   Past Medical History  Diagnosis Date  . Hallucinations 09/30/2014    Sizoaffective  . Depression   . Anxiety   . Hypertension   . Tardive dyskinesia 10/2014    recent onset  . GERD (gastroesophageal reflux disease)     .pro HOSPITAL COURSE:  The patient is 24 year old Caucasian female with past medical history of anxiety, depression, schizoaffective disorder/bipolar type who apparently swallows foreign bodies and already had a 2 surgical operations because of foreign objects passing into the bowel presents to the hospital after she swallowed open safety pin again and ate a sandwich. Gastroenterologist, Dr. Vira Agar was consulted, although  EGD was not performed until July 15 th, 2016 due to recent meal. X-ray done in the morning  15 th of July revealed safety pin at the site of pylorus. Patient had no pain in the abdomen except for  Chronic pain. No nausea and vomiting. Patient underwent  EGD today and safety pin was retrieved. Patient was seen by psychiatrist, DR Weber Cooks, who recommended patient to follow up with her psychiatrist in [place].  1. Foreign body ingestion , s/p EGD 7.15.2016, retrieval of foreign body as it was  at the site of pylorus 2. Schizoaffective disorder, bipolar type. Psychiatrist consultation  was requested, recommended primary psychiatry follow up . 3. Hypertension , well controlled. Continue outpatient medications with Norvasc  4. Hypothyroidism. Continue Synthroid   DISCHARGE CONDITIONS:   fair  CONSULTS OBTAINED:  Treatment Team:  [name], MD  DRUG ALLERGIES:   Allergies  Allergen Reactions  . Betadine [Povidone Iodine] Other (See Comments)    Reaction:  Unknown   . Shellfish-Derived Products Other (See Comments)    Reaction:  Unknown   . Iodine Rash    DISCHARGE MEDICATIONS:   Current Discharge Medication List    CONTINUE these medications which have NOT CHANGED   Details  amLODipine (NORVASC) 2.5 MG tablet Take 1 tablet (2.5 mg total) by mouth daily. Qty: 30 tablet, Refills: 0    benztropine (COGENTIN) 1 MG tablet Take 1 tablet (1 mg total) by mouth at bedtime. Qty: 30 tablet, Refills: 0    docusate sodium (COLACE) 100 MG capsule Take 2 capsules (200 mg total) by mouth 2 (two) times daily. Qty: 120 capsule, Refills: 0    !! fluPHENAZine (PROLIXIN) 10 MG tablet Take 1 tablet (10 mg total) by mouth at bedtime. Qty: 30 tablet, Refills: 0    !! fluPHENAZine (PROLIXIN) 5 MG tablet Take 1 tablet (5 mg total) by mouth daily. At 4 pm Qty: 60 tablet, Refills: 0    levothyroxine (SYNTHROID, LEVOTHROID) 75 MCG tablet Take 1 tablet (75 mcg total) by mouth daily before breakfast. Qty: 30 tablet, Refills: 0    lurasidone (LATUDA) 80 MG TABS tablet Take 1 tablet (80 mg total) by mouth daily with supper. Qty: 30 tablet,  Refills: 0    OXcarbazepine (TRILEPTAL) 150 MG tablet Take 1 tablet (150 mg total) by mouth 2 (two) times daily. Qty: 60 tablet, Refills: 0    senna (SENOKOT) 8.6 MG TABS tablet Take 2 tablets (17.2 mg total) by mouth at bedtime. Qty: 60 each, Refills: 0    traZODone (DESYREL) 50 MG tablet Take 1 tablet (50 mg total) by mouth at bedtime as needed for sleep. Qty: 30 tablet, Refills: 0    venlafaxine XR (EFFEXOR-XR) 150 MG 24 hr  capsule Take 1 capsule (150 mg total) by mouth daily with breakfast. Qty: 30 capsule, Refills: 0     !! - Potential duplicate medications found. Please discuss with provider.       DISCHARGE INSTRUCTIONS:    Follow up with PCP and primary psychiatry ASAP.   If you experience worsening of your admission symptoms, develop shortness of breath, life threatening emergency, suicidal or homicidal thoughts you must seek medical attention immediately by calling 911 or calling your MD immediately  if symptoms less severe.  You Must read complete instructions/literature along with all the possible adverse reactions/side effects for all the Medicines you take and that have been prescribed to you. Take any new Medicines after you have completely understood and accept all the possible adverse reactions/side effects.   Please note  You were cared for by a hospitalist during your hospital stay. If you have any questions about your discharge medications or the care you received while you were in the hospital after you are discharged, you can call the unit and asked to speak with the hospitalist on call if the hospitalist that took care of you is not available. Once you are discharged, your primary care physician will handle any further medical issues. Please note that NO REFILLS for any discharge medications will be authorized once you are discharged, as it is imperative that you return to your primary care physician (or establish a relationship with a primary care physician if you do not have one) for your aftercare needs so that they can reassess your need for medications and monitor your lab values.    Today   CHIEF COMPLAINT:   Chief Complaint  Patient presents with  . Suicidal    HISTORY OF PRESENT ILLNESS:  Melanie Bell  is a 24 y.o. female with a known history of  anxiety, depression, schizoaffective disorder/bipolar type who apparently swallows foreign bodies and already had a 2 surgical  operations because of foreign objects passing into the bowel presents to the hospital after she swallowed open safety pin again and ate a sandwich. Gastroenterologist, Dr. Vira Agar was consulted, although  EGD was not performed until July 15 th, 2016 due to recent meal. X-ray done in the morning  15 th of July revealed safety pin at the site of pylorus. Patient had no pain in the abdomen except for  Chronic pain. No nausea and vomiting. Patient underwent  EGD today and safety pin was retrieved. Patient was seen by psychiatrist, DR Weber Cooks, who recommended patient to follow up with her psychiatrist in [place].  1. Foreign body ingestion , s/p EGD 7.15.2016, retrieval of foreign body as it was  at the site of pylorus 2. Schizoaffective disorder, bipolar type. Psychiatrist consultation was requested, recommended primary psychiatry follow up . 3. Hypertension , well controlled. Continue outpatient medications with Norvasc  4. Hypothyroidism. Continue Synthroid     VITAL SIGNS:  Blood pressure 131/69, pulse 78, temperature 98.5 F (36.9 C), temperature source Oral, resp.  rate 16, height 5\' 6"  (1.676 m), weight 99.791 kg (220 lb), SpO2 99 %.  I/O:   Intake/Output Summary (Last 24 hours) at 11/28/14 1631 Last data filed at 11/28/14 1500  Gross per 24 hour  Intake   1390 ml  Output   2000 ml  Net   -610 ml    PHYSICAL EXAMINATION:  GENERAL:  24 y.o.-year-old patient lying in the bed with no acute distress.  EYES: Pupils equal, round, reactive to light and accommodation. No scleral icterus. Extraocular muscles intact.  HEENT: Head atraumatic, normocephalic. Oropharynx and nasopharynx clear.  NECK:  Supple, no jugular venous distention. No thyroid enlargement, no tenderness.  LUNGS: Normal breath sounds bilaterally, no wheezing, rales,rhonchi or crepitation. No use of accessory muscles of respiration.  CARDIOVASCULAR: S1, S2 normal. No murmurs, rubs, or gallops.  ABDOMEN: Soft, non-tender,  non-distended. Bowel sounds present. No organomegaly or mass.  EXTREMITIES: No pedal edema, cyanosis, or clubbing.  NEUROLOGIC: Cranial nerves II through XII are intact. Muscle strength 5/5 in all extremities. Sensation intact. Gait not checked.  PSYCHIATRIC: The patient is alert and oriented x 3.  SKIN: No obvious rash, lesion, or ulcer.   DATA REVIEW:   CBC  Recent Labs Lab 11/27/14 1633  WBC 7.6  HGB 12.5  HCT 37.5  PLT 180    Chemistries   Recent Labs Lab 11/27/14 1633  NA 135  K 3.5  CL 102  CO2 25  GLUCOSE 93  BUN 7  CREATININE 0.67  CALCIUM 9.6  AST 16  ALT 7*  ALKPHOS 74  BILITOT 0.3    Cardiac Enzymes No results for input(s): TROPONINI in the last 168 hours.  Microbiology Results  No results found for this or any previous visit.  RADIOLOGY:  Dg Chest 2 View  11/27/2014   CLINICAL DATA:  Swallowed safety pin  EXAM: CHEST - 2 VIEW  COMPARISON:  09/29/2014, 11/27/2014  FINDINGS: Cardiac shadow is within normal limits. The lungs are well aerated bilaterally. No focal infiltrate or sizable effusion is seen. The known safety pin within the stomach is not appreciated on this exam. A 3 linear metallic density seen on the prior abdominal film the project in the soft tissues of the anterior chest wall in the midline and are likely subcutaneous in nature. No other focal abnormality is noted.  IMPRESSION: The previously described linear metallic densities lie within the subcutaneous tissues of the anterior chest in the midline. No other focal abnormality is seen.   Electronically Signed   By: Inez Catalina M.D.   On: 11/27/2014 19:01   Dg Abd 1 View  11/28/2014   CLINICAL DATA:  Patient swallowed open safety pin  EXAM: ABDOMEN - 1 VIEW  COMPARISON:  November 27, 2014  FINDINGS: Open safety pin is now at the level of the pylorus. There is diffuse stool throughout the colon. The bowel gas pattern is unremarkable. No free air is seen on this supine examination.  IMPRESSION: Open  safety pin at level of pylorus. Bowel gas pattern unremarkable. No free air is seen on this supine examination.   Electronically Signed   By: Lowella Grip III M.D.   On: 11/28/2014 07:27   Dg Abd 1 View  11/27/2014   CLINICAL DATA:  Patient swallowed open safety pin  EXAM: ABDOMEN - 1 VIEW  COMPARISON:  November 06, 2014  FINDINGS: There is an open safety pin in the body of the stomach on the left. There is moderate stool throughout the  colon. There is no bowel dilatation or air-fluid level suggesting obstruction. No free air. Lung bases are clear.  There are 3 linear metallic foreign bodies overlying the lower thoracic spine in the midline of the lower chest region.  IMPRESSION: Open safety pin within stomach. Bowel gas pattern unremarkable. No free air. There are 3 linear metallic foreign bodies overlying the lower thoracic region of uncertain etiology. These linear metallic foreign bodies potentially could be located within the distal esophagus. PA and lateral chest radiograph could be helpful in this regard for further localization.  These results were called by telephone at the time of interpretation on 11/27/2014 at 6:44 pm to Dr. Ponciano Ort , who verbally acknowledged these results.   Electronically Signed   By: Lowella Grip III M.D.   On: 11/27/2014 18:44    EKG:   Orders placed or performed in visit on 09/20/10  . EKG 12-Lead      Management plans discussed with the patient, family and they are in agreement.  CODE STATUS:     Code Status Orders        Start     Ordered   11/27/14 2018  Full code   Continuous     11/27/14 2018      TOTAL TIME TAKING CARE OF THIS PATIENT: 40 minutes.    Theodoro Grist M.D on 11/28/2014 at 4:31 PM  Between 7am to 6pm - Pager - [phone]  After 6pm go to [email or web address] - password [email or web address] [facility]  Office  [phone]  CC: Primary care physician; [facility], MD

## 2014-11-28 NOTE — Transfer of Care (Signed)
Immediate Anesthesia Transfer of Care Note  Patient: Melanie Bell  Procedure(s) Performed: Procedure(s): ESOPHAGOGASTRODUODENOSCOPY (EGD) (N/A)  Patient Location: PACU  Anesthesia Type:General  Level of Consciousness: sedated  Airway & Oxygen Therapy: Patient Spontanous Breathing and Patient connected to face mask oxygen  Post-op Assessment: Report given to RN and Post -op Vital signs reviewed and stable  Post vital signs: Reviewed and stable  Last Vitals:  Filed Vitals:   11/28/14 1152  BP: 122/76  Pulse: 93  Temp: 37 C  Resp: 20    Complications: No apparent anesthesia complications

## 2014-11-28 NOTE — Progress Notes (Signed)
°   11/28/14 0800  Clinical Encounter Type  Visited With Patient (Patient in process of waking up.)  Visit Type Initial  Referral From Nurse  Spiritual Encounters  Spiritual Needs Prayer (Patient requested a late visit for prayer)

## 2014-11-28 NOTE — Anesthesia Postprocedure Evaluation (Signed)
  Anesthesia Post-op Note  Patient: Melanie Bell  Procedure(s) Performed: Procedure(s): ESOPHAGOGASTRODUODENOSCOPY (EGD) (N/A)  Anesthesia type:General  Patient location: PACU  Post pain: Pain level controlled  Post assessment: Post-op Vital signs reviewed, Patient's Cardiovascular Status Stable, Respiratory Function Stable, Patent Airway and No signs of Nausea or vomiting  Post vital signs: Reviewed and stable  Last Vitals:  Filed Vitals:   11/28/14 1242  BP: 119/74  Pulse: 71  Temp: 37.1 C  Resp: 16    Level of consciousness: awake, alert  and patient cooperative  Complications: No apparent anesthesia complications

## 2014-11-28 NOTE — Clinical Social Work Note (Signed)
Clinical Social Work Assessment  Patient Details  [name] MRN: [id number] Date of Birth: 07-07-1990  Date of referral:  11/28/14               Reason for consult:  Facility Placement                Permission sought to share information with:    Permission granted to share information::     Name::        Agency::     Relationship::     Contact Information:     Housing/Transportation Living arrangements for the past 2 months:  Group Home Source of Information:  Facility Patient Interpreter Needed:  None Criminal Activity/Legal Involvement Pertinent to Current Situation/Hospitalization:  No - Comment as needed Significant Relationships:   (her mother) Lives with:  Facility Resident Do you feel safe going back to the place where you live?  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   Social Worker assessment / plan:  CSW contacted the supervisor in charge this morning via phone at [employer]: [phone] and was told that Melanie Bell: [phone] was the owner. CSW attempted to reach Melanie Bell at this number but her mailbox was full and unable to leave a message. CSW has spoken to patient who is known to Melanie Bell from previous admissions for similar incidents. The GI physician was able to retrieve the open safety pin from her stomach today. It is uncertain yet if patient will discharge back to her group home today. Patient states she wishes to return when time.   Employment status:  Disabled (Comment on whether or not currently receiving Disability) Insurance information:  Medicaid [place] PT Recommendations:  Not assessed at this time Information / Referral to community resources:     Patient/Family's Response to care:  Patient was resting at time of CSW visit but awakened to answer questions.   Patient/Family's Understanding of and Emotional Response to Diagnosis, Current Treatment,  and Prognosis:  Patient expressed appreciation for CSW assistance.   Emotional Assessment Appearance:  Appears stated age Attitude/Demeanor/Rapport:    Affect (typically observed):    Orientation:    Alcohol / Substance use:  Not Applicable Psych involvement (Current and /or in the community):  Outpatient Provider  Discharge Needs  Concerns to be addressed:  Mental Health Concerns Readmission within the last 30 days:  No Current discharge risk:  Psychiatric Illness Barriers to Discharge:  No Barriers Identified   Melanie Leff, LCSW 11/28/2014, 1:41 PM

## 2014-11-28 NOTE — Consult Note (Signed)
GI Inpatient Consult Note  Reason for Consult:foreign body swallowed in stomach   Attending Requesting Consult: Dr. Darvin Neighbours  History of Present Illness: Melanie Bell is a 24 y.o. female who swallows foreign objects for attention and has two previous surgical removals of such objects and swallowed an open safety pin and ate a sandwich after.  Past Medical History:  Past Medical History  Diagnosis Date  . Hallucinations 09/30/2014    Sizoaffective  . Depression   . Anxiety   . Hypertension   . Tardive dyskinesia 10/2014    recent onset  . GERD (gastroesophageal reflux disease)     Problem List: Patient Active Problem List   Diagnosis Date Noted  . 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 Schizoaffective disorder, bipolar type 11/06/2014  . Tardive dyskinesia 10/06/2014  . Tobacco use disorder 09/30/2014  . H/O foreign body ingestion 09/29/2014  . Hypothyroid 09/29/2014  . Hypertension 09/29/2014  . Constipation 09/29/2014    Past Surgical History: Past Surgical History  Procedure Laterality Date  . Breast lumpectomy    . Wisdom tooth extraction    . Abdominal surgery      "years ago" to remove foreign objects    Allergies: Allergies  Allergen Reactions  . Betadine [Povidone Iodine] Other (See Comments)    Reaction:  Unknown   . Shellfish-Derived Products Other (See Comments)    Reaction:  Unknown   . Iodine Rash    Home Medications: Prescriptions prior to admission  Medication Sig Dispense Refill Last Dose  . amLODipine (NORVASC) 2.5 MG tablet Take 1 tablet (2.5 mg total) by mouth daily. 30 tablet 0 11/27/2014 at Unknown time  . benztropine (COGENTIN) 1 MG tablet Take 1 tablet (1 mg total) by mouth at bedtime. 30 tablet 0 11/26/2014 at Unknown time  . docusate sodium (COLACE) 100 MG capsule Take 2 capsules (200 mg total) by mouth 2 (two) times daily. 120 capsule 0 11/27/2014 at Unknown time  . fluPHENAZine (PROLIXIN) 10 MG tablet Take 1 tablet (10 mg total) by  mouth at bedtime. 30 tablet 0 11/26/2014 at Unknown time  . fluPHENAZine (PROLIXIN) 5 MG tablet Take 1 tablet (5 mg total) by mouth daily. At 4 pm 60 tablet 0 11/27/2014 at Unknown time  . levothyroxine (SYNTHROID, LEVOTHROID) 75 MCG tablet Take 1 tablet (75 mcg total) by mouth daily before breakfast. 30 tablet 0 11/27/2014 at Unknown time  . lurasidone (LATUDA) 80 MG TABS tablet Take 1 tablet (80 mg total) by mouth daily with supper. 30 tablet 0 11/26/2014 at Unknown time  . OXcarbazepine (TRILEPTAL) 150 MG tablet Take 1 tablet (150 mg total) by mouth 2 (two) times daily. 60 tablet 0 11/27/2014 at Unknown time  . senna (SENOKOT) 8.6 MG TABS tablet Take 2 tablets (17.2 mg total) by mouth at bedtime. 60 each 0 11/26/2014 at Unknown time  . traZODone (DESYREL) 50 MG tablet Take 1 tablet (50 mg total) by mouth at bedtime as needed for sleep. 30 tablet 0 11/26/2014 at Unknown time  . venlafaxine XR (EFFEXOR-XR) 150 MG 24 hr capsule Take 1 capsule (150 mg total) by mouth daily with breakfast. 30 capsule 0 11/27/2014 at Unknown time  . lithium carbonate (LITHOBID) 300 MG CR tablet Take 1 tablet (300 mg total) by mouth every morning. (Patient not taking: Reported on 11/27/2014) 30 tablet 0 11/04/2014 at 0800  . lithium carbonate (LITHOBID) 300 MG CR tablet Take 2 tablets (600 mg total) by mouth at bedtime. (Patient not taking: Reported  on 11/27/2014) 60 tablet 0 11/03/2014 at [place] medication reconciliation was completed with the patient.   Scheduled Inpatient Medications:   . amLODipine  2.5 mg Oral Daily  . benztropine  1 mg Oral QHS  . docusate sodium  200 mg Oral BID  . fluPHENAZine  10 mg Oral QHS  . fluPHENAZine  10 mg Oral QHS  . fluPHENAZine  5 mg Oral QPC lunch  . fluPHENAZine  5 mg Oral Daily  . levothyroxine  75 mcg Oral QAC breakfast  . lithium carbonate  300 mg Oral q morning - 10a  . lithium carbonate  600 mg Oral QHS  . lurasidone  80 mg Oral Q supper  . lurasidone  80 mg Oral Q supper   . OXcarbazepine  150 mg Oral BID  . OXcarbazepine  150 mg Oral BID  . senna  2 tablet Oral QHS  . traZODone  50 mg Oral QHS  . venlafaxine XR  150 mg Oral Q breakfast  . venlafaxine XR  150 mg Oral Q breakfast    Continuous Inpatient Infusions:     PRN Inpatient Medications:  acetaminophen **OR** acetaminophen, albuterol, ondansetron **OR** ondansetron (ZOFRAN) IV, traZODone  Family History: family history includes Depression in her mother; Hypertension in her mother.  The patient's family history is negative for inflammatory bowel disorders, GI malignancy, or solid organ transplantation.  Social History:   reports that she has been smoking Cigarettes.  She has a 1.5 pack-year smoking history. She does not have any smokeless tobacco history on file. She reports that she does not drink alcohol or use illicit drugs. The patient denies ETOH, tobacco, or drug use.   Review of Systems: Constitutional: Weight is stable.  Eyes: No changes in vision. ENT: No oral lesions, sore throat.  GI: see HPI.  Heme/Lymph: No easy bruising.  CV: No chest pain.  GU: No hematuria.  Integumentary: No rashes.  Neuro: No headaches.  Psych:schizoaffective disorder with multiple visits for swallowed objects and repeat EGD for removal and 2 surgical procedures for removal also.  Pt lives in a group home and admits to swallowing these to get attention.  Endocrine: No heat/cold intolerance.  Allergic/Immunologic: No urticaria.  Resp: No cough, SOB.  Musculoskeletal: No joint swelling.    Physical Examination: BP 131/69 mmHg  Pulse 59  Temp(Src) 98.1 F (36.7 C) (Oral)  Resp 16  Ht 5\' 6"  (1.676 m)  Wt 98.249 kg (216 lb 9.6 oz)  BMI 34.98 kg/m2  SpO2 100%  LMP  (LMP Unknown) Gen: NAD, alert and oriented x 4, flat affect HEENT: PEERLA, EOMI, Neck: supple, no JVD or thyromegaly Chest: CTA bilaterally, no wheezes, crackles, or other adventitious sounds CV: RRR, no m/g/c/r Abd: soft, NT, ND, +BS in  all four quadrants; no HSM, guarding, ridigity, or rebound tenderness, obese, Ext: no edema, well perfused with 2+ pulses, Skin: no rash or lesions noted Lymph: no LAD  Data: Lab Results  Component Value Date   WBC 7.6 11/27/2014   HGB 12.5 11/27/2014   HCT 37.5 11/27/2014   MCV 92.0 11/27/2014   PLT 180 11/27/2014    Recent Labs Lab 11/27/14 1633  HGB 12.5   Lab Results  Component Value Date   NA 135 11/27/2014   K 3.5 11/27/2014   CL 102 11/27/2014   CO2 25 11/27/2014   BUN 7 11/27/2014   CREATININE 0.67 11/27/2014   Lab Results  Component Value Date   ALT 7* 11/27/2014  AST 16 11/27/2014   ALKPHOS 74 11/27/2014   BILITOT 0.3 11/27/2014   No results for input(s): APTT, INR, PTT in the last 168 hours. Assessment/Plan: [name] is a 24 y.o. female who has a mental disorder and swallows objects for attention.  She has had two previous surgical abdominal procedures for ingested objects.  Pt has eaten and has food in stomach on x-ray and therefore I will not be able to find the object due to the food.  Will wait until morning and repeat x-ray and see if it is still there or has moved into small bowel.  Surgical consult needed if the safety pin has passed into small bowel.  Recommendations:  Thank you for the consult. Please call with questions or concerns.  Gaylyn Cheers, MD

## 2014-11-28 NOTE — Progress Notes (Signed)
Paged and spoke with Dr. Vira Agar regarding diet order - pt has been NPO since arrival to floor and is now s/p EGD.  Order received for full liquid diet to advance to mechanical soft.  Dr. Vira Agar also asked me to relay to prime doc that pt is stable for d/c from GI perspective.

## 2014-11-28 NOTE — Anesthesia Procedure Notes (Signed)
Procedure Name: Intubation Date/Time: 11/28/2014 11:27 AM Performed by: Allean Found Pre-anesthesia Checklist: Patient identified, Emergency Drugs available, Suction available, Patient being monitored and Timeout performed Patient Re-evaluated:Patient Re-evaluated prior to inductionOxygen Delivery Method: Circle system utilized Preoxygenation: Pre-oxygenation with 100% oxygen Intubation Type: IV induction Laryngoscope Size: Mac and 3 Grade View: Grade I Tube type: Oral Number of attempts: 1 Airway Equipment and Method: Stylet Placement Confirmation: ETT inserted through vocal cords under direct vision,  positive ETCO2 and breath sounds checked- equal and bilateral Secured at: 20 cm Tube secured with: Tape Dental Injury: Teeth and Oropharynx as per pre-operative assessment

## 2014-11-28 NOTE — Op Note (Signed)
[facility] Gastroenterology Patient [name] Procedure Date: 11/28/2014 10:50 AM MRN: [id number] Account #: [id number] Date of Birth: 27-Apr-1991 Admit Type: Outpatient Age: 24 Room: [facility] ENDO ROOM 1 Gender: Female Note Status: Finalized Procedure:         Upper GI endoscopy Indications:       Foreign body in the stomach Providers:         [name], MD Referring MD:      Casilda Carls, MD (Referring MD) Medicines:         Propofol per Anesthesia Complications:     No immediate complications. Procedure:         Pre-Anesthesia Assessment:                    - After reviewing the risks and benefits, the patient was                     deemed in satisfactory condition to undergo the procedure.                    After obtaining informed consent, the endoscope was passed                     under direct vision. Throughout the procedure, the                     patient's blood pressure, pulse, and oxygen saturations                     were monitored continuously. The Olympus GIF-160 endoscope                     (S#. [id number]) was introduced through the mouth, and                     advanced to the second part of duodenum. The upper GI                     endoscopy was accomplished without difficulty. The patient                     tolerated the procedure well. Findings:      A rubber dam was attached to the scope and the initial exam done with       this scope      The examined esophagus was normal.      A few, small non-bleeding erosions were found in the gastric antrum.       There were no stigmata of recent bleeding.      The duodenal bulb was normal. Due to not finding any safety pin in area       examined I removed the scope with the rubber dam and went back to       examine the area again and found the object in the second portion of the       duodenum and grabbed the round part of it and withdrew it with the point       trailing and  removed it without difficulty. Pt tolerated well. Impression:        - Normal esophagus.                    - Non-bleeding erosive gastropathy.                    -  Normal duodenal bulb.                    - No specimens collected. Recommendation:    - The findings and recommendations were discussed with the                     referring physician. [name], MD 11/28/2014 11:42:00 AM This report has been signed electronically. Number of Addenda: 0 Note Initiated On: 11/28/2014 10:50 AM      [facility]

## 2014-11-28 NOTE — Progress Notes (Signed)
[place] [facility] [place] [name]    MR#:  [id number]  DATE OF BIRTH:  [birth year]  SUBJECTIVE:  CHIEF COMPLAINT:   Chief Complaint  Patient presents with  . Suicidal   patient is 24 year old Caucasian female with past medical history of anxiety, depression, schizoaffective disorder/bipolar type who apparently swallows foreign bodies and already had a 2 surgical operations because of foreign objects passing into the bowel presents to the hospital after she swallowed open safety pin again and ate a sandwich. Gastroenterologist, Dr. Vira Agar was consulted, although he felt that EGD should be postponed until today morning as long as safety pin did not pass to the small bowel. X-ray done today revealed safety pin at the site of pylorus. Patient denies any pains in the abdomen except in the lower abdomen which seems to be chronic. No nausea and vomiting. Patient is for EGD today.     Review of Systems  Constitutional: Negative for fever, chills and weight loss.  HENT: Negative for congestion.   Eyes: Negative for blurred vision and double vision.  Respiratory: Negative for cough, sputum production, shortness of breath and wheezing.   Cardiovascular: Negative for chest pain, palpitations, orthopnea, leg swelling and PND.  Gastrointestinal: Negative for nausea, vomiting, abdominal pain, diarrhea, constipation and blood in stool.  Genitourinary: Negative for dysuria, urgency, frequency and hematuria.  Musculoskeletal: Negative for falls.  Neurological: Negative for dizziness, tremors, focal weakness and headaches.  Endo/Heme/Allergies: Does not bruise/bleed easily.  Psychiatric/Behavioral: Negative for depression. The patient does not have insomnia.     VITAL SIGNS: Blood pressure 113/67, pulse 63, temperature 99 F (37.2 C), temperature source Oral, resp. rate 17, height 5\' 6"  (1.676 m), weight 99.791 kg (220 lb), SpO2 99 %.  PHYSICAL  EXAMINATION:   GENERAL:  24 y.o.-year-old , obese patient lying in the bed with no acute distress.  EYES: Pupils equal, round, reactive to light and accommodation. No scleral icterus. Extraocular muscles intact.  HEENT: Head atraumatic, normocephalic. Oropharynx and nasopharynx clear.  NECK:  Supple, no jugular venous distention. No thyroid enlargement, no tenderness.  LUNGS: Normal breath sounds bilaterally, no wheezing, rales,rhonchi or crepitation. No use of accessory muscles of respiration.  CARDIOVASCULAR: S1, S2 normal. No murmurs, rubs, or gallops.  ABDOMEN: Soft, nontender, nondistended. Bowel sounds present, some diminished. No organomegaly or mass.  EXTREMITIES: No pedal edema, cyanosis, or clubbing.  NEUROLOGIC: Cranial nerves II through XII are intact. Muscle strength 5/5 in all extremities. Sensation intact. Gait not checked.  PSYCHIATRIC: The patient is alert and oriented x 3.  SKIN: No obvious rash, lesion, or ulcer.   ORDERS/RESULTS REVIEWED:   CBC  Recent Labs Lab 11/27/14 1633  WBC 7.6  HGB 12.5  HCT 37.5  PLT 180  MCV 92.0  MCH 30.6  MCHC 33.3  RDW 13.3   ------------------------------------------------------------------------------------------------------------------  Chemistries   Recent Labs Lab 11/27/14 1633  NA 135  K 3.5  CL 102  CO2 25  GLUCOSE 93  BUN 7  CREATININE 0.67  CALCIUM 9.6  AST 16  ALT 7*  ALKPHOS 74  BILITOT 0.3   ------------------------------------------------------------------------------------------------------------------ estimated creatinine clearance is 128.1 mL/min (by C-G formula based on Cr of 0.67). ------------------------------------------------------------------------------------------------------------------ No results for input(s): TSH, T4TOTAL, T3FREE, THYROIDAB in the last 72 hours.  Invalid input(s): FREET3  Cardiac Enzymes No results for input(s): CKMB, TROPONINI, MYOGLOBIN in the last 168  hours.  Invalid input(s): CK ------------------------------------------------------------------------------------------------------------------ Invalid input(s): POCBNP ---------------------------------------------------------------------------------------------------------------  RADIOLOGY:  Dg Chest 2 View  11/27/2014   CLINICAL DATA:  Swallowed safety pin  EXAM: CHEST - 2 VIEW  COMPARISON:  09/29/2014, 11/27/2014  FINDINGS: Cardiac shadow is within normal limits. The lungs are well aerated bilaterally. No focal infiltrate or sizable effusion is seen. The known safety pin within the stomach is not appreciated on this exam. A 3 linear metallic density seen on the prior abdominal film the project in the soft tissues of the anterior chest wall in the midline and are likely subcutaneous in nature. No other focal abnormality is noted.  IMPRESSION: The previously described linear metallic densities lie within the subcutaneous tissues of the anterior chest in the midline. No other focal abnormality is seen.   Electronically Signed   By: Inez Catalina M.D.   On: 11/27/2014 19:01   Dg Abd 1 View  11/28/2014   CLINICAL DATA:  Patient swallowed open safety pin  EXAM: ABDOMEN - 1 VIEW  COMPARISON:  November 27, 2014  FINDINGS: Open safety pin is now at the level of the pylorus. There is diffuse stool throughout the colon. The bowel gas pattern is unremarkable. No free air is seen on this supine examination.  IMPRESSION: Open safety pin at level of pylorus. Bowel gas pattern unremarkable. No free air is seen on this supine examination.   Electronically Signed   By: Lowella Grip III M.D.   On: 11/28/2014 07:27   Dg Abd 1 View  11/27/2014   CLINICAL DATA:  Patient swallowed open safety pin  EXAM: ABDOMEN - 1 VIEW  COMPARISON:  November 06, 2014  FINDINGS: There is an open safety pin in the body of the stomach on the left. There is moderate stool throughout the colon. There is no bowel dilatation or air-fluid level  suggesting obstruction. No free air. Lung bases are clear.  There are 3 linear metallic foreign bodies overlying the lower thoracic spine in the midline of the lower chest region.  IMPRESSION: Open safety pin within stomach. Bowel gas pattern unremarkable. No free air. There are 3 linear metallic foreign bodies overlying the lower thoracic region of uncertain etiology. These linear metallic foreign bodies potentially could be located within the distal esophagus. PA and lateral chest radiograph could be helpful in this regard for further localization.  These results were called by telephone at the time of interpretation on 11/27/2014 at 6:44 pm to Dr. Ponciano Ort , who verbally acknowledged these results.   Electronically Signed   By: Lowella Grip III M.D.   On: 11/27/2014 18:44    EKG:  Orders placed or performed in visit on 09/20/10  . EKG 12-Lead    ASSESSMENT AND PLAN:  Principal Problem:   H/O foreign body ingestion Active Problems:   Hypothyroid   Hypertension   Constipation   Schizoaffective disorder, bipolar type   Foreign body alimentary tract 1. Foreign body ingestion , didn't supportive therapy, nothing by mouth for now since patient is to undergo EGD as foreign body is at the site of pylorus Dr. Vira Agar was consulted as above . If foreign body is not retrieved that surgical consultation is likely 2.  Schizoaffective disorder, bipolar type. Psychiatrist consultation was requested . Supportive therapy for now 3. Hypertension , well controlled. Continue outpatient medications with Norvasc  4. Hypothyroidism. Check TSH if it  was not done earlier. Continue Synthroid     Management plans discussed with the patient, family and they are in agreement.   DRUG ALLERGIES:  Allergies  Allergen  Reactions  . Betadine [Povidone Iodine] Other (See Comments)    Reaction:  Unknown   . Shellfish-Derived Products Other (See Comments)    Reaction:  Unknown   . Iodine Rash    CODE  STATUS:     Code Status Orders        Start     Ordered   11/27/14 2018  Full code   Continuous     11/27/14 2018      TOTAL TIME TAKING CARE OF THIS PATIENT: 35  minutes.    Theodoro Grist M.D on 11/28/2014 at 10:30 AM  Between 7am to 6pm - Pager - [phone]  After 6pm go to [email or web address] - password [email or web address] [place] [facility]  Office  [phone]  CC: Primary care physician; [facility], MD

## 2014-11-28 NOTE — Progress Notes (Signed)
Patient A&O, VSS.  Declined intervention for mild chronic abdominal pain.  Up to BR to void independently.  Tolerated EGD well.  Patient discharged back to group home in stable condition escorted by nursing staff.

## 2014-11-28 NOTE — Anesthesia Preprocedure Evaluation (Addendum)
Anesthesia Evaluation  Patient identified by MRN, date of birth, ID band Patient awake    Reviewed: Allergy & Precautions, NPO status , Patient's Chart, lab work & pertinent test results, reviewed documented beta blocker date and time   Airway Mallampati: III  TM Distance: >3 FB     Dental  (+) Chipped   Pulmonary Current Smoker,          Cardiovascular hypertension,     Neuro/Psych PSYCHIATRIC DISORDERS Anxiety Depression Bipolar Disorder Schizophrenia    GI/Hepatic GERD-  ,  Endo/Other  Hypothyroidism   Renal/GU      Musculoskeletal   Abdominal   Peds  Hematology   Anesthesia Other Findings Obesity.will intubate.  Reproductive/Obstetrics                            Anesthesia Physical Anesthesia Plan  ASA: III  Anesthesia Plan: General   Post-op Pain Management:    Induction: Intravenous  Airway Management Planned: Oral ETT  Additional Equipment:   Intra-op Plan:   Post-operative Plan:   Informed Consent: I have reviewed the patients History and Physical, chart, labs and discussed the procedure including the risks, benefits and alternatives for the proposed anesthesia with the patient or authorized representative who has indicated his/her understanding and acceptance.     Plan Discussed with: CRNA  Anesthesia Plan Comments:        Anesthesia Quick Evaluation

## 2014-11-28 NOTE — Progress Notes (Signed)
°   11/28/14 1000  Clinical Encounter Type  Visited With Patient (Pat.req. later visit. )  Visit Type Initial  Referral From Nurse (Silent prayer provided.)  Consult/Referral To Chaplain (Chaplain to do follow up in PM)  Spiritual Encounters  Spiritual Needs Prayer

## 2014-12-03 ENCOUNTER — Emergency Department
Admission: EM | Admit: 2014-12-03 | Discharge: 2014-12-05 | Disposition: A | Discharge status: Other Acute Inpatient Hospital | Payer: MEDICAID | Attending: Emergency Medicine | Admitting: Emergency Medicine

## 2014-12-03 DIAGNOSIS — Z720 Tobacco use: Secondary | ICD-10-CM | POA: Diagnosis not present

## 2014-12-03 DIAGNOSIS — R45851 Suicidal ideations: Secondary | ICD-10-CM | POA: Diagnosis present

## 2014-12-03 DIAGNOSIS — I10 Essential (primary) hypertension: Secondary | ICD-10-CM | POA: Diagnosis not present

## 2014-12-03 DIAGNOSIS — R440 Auditory hallucinations: Secondary | ICD-10-CM | POA: Diagnosis not present

## 2014-12-03 DIAGNOSIS — Z3202 Encounter for pregnancy test, result negative: Secondary | ICD-10-CM | POA: Insufficient documentation

## 2014-12-03 DIAGNOSIS — E039 Hypothyroidism, unspecified: Secondary | ICD-10-CM | POA: Diagnosis present

## 2014-12-03 DIAGNOSIS — F250 Schizoaffective disorder, bipolar type: Secondary | ICD-10-CM | POA: Diagnosis not present

## 2014-12-03 DIAGNOSIS — K5900 Constipation, unspecified: Secondary | ICD-10-CM | POA: Diagnosis present

## 2014-12-03 LAB — CBC
HEMATOCRIT: 38.1 % (ref 35.0–47.0)
Hemoglobin: 12.7 g/dL (ref 12.0–16.0)
MCH: 30.1 pg (ref 26.0–34.0)
MCHC: 33.2 g/dL (ref 32.0–36.0)
MCV: 90.6 fL (ref 80.0–100.0)
Platelets: 187 10*3/uL (ref 150–440)
RBC: 4.21 MIL/uL (ref 3.80–5.20)
RDW: 12.8 % (ref 11.5–14.5)
WBC: 9 10*3/uL (ref 3.6–11.0)

## 2014-12-03 LAB — URINE DRUG SCREEN, QUALITATIVE (ARMC ONLY)
Amphetamines, Ur Screen: NOT DETECTED
Barbiturates, Ur Screen: NOT DETECTED
Benzodiazepine, Ur Scrn: NOT DETECTED
CANNABINOID 50 NG, UR ~~LOC~~: NOT DETECTED
Cocaine Metabolite,Ur ~~LOC~~: NOT DETECTED
MDMA (Ecstasy)Ur Screen: NOT DETECTED
Methadone Scn, Ur: NOT DETECTED
Opiate, Ur Screen: NOT DETECTED
Phencyclidine (PCP) Ur S: NOT DETECTED
TRICYCLIC, UR SCREEN: NOT DETECTED

## 2014-12-03 LAB — COMPREHENSIVE METABOLIC PANEL
ALBUMIN: 4.3 g/dL (ref 3.5–5.0)
ALK PHOS: 75 U/L (ref 38–126)
ALT: 8 U/L — ABNORMAL LOW (ref 14–54)
ANION GAP: 7 (ref 5–15)
AST: 15 U/L (ref 15–41)
BILIRUBIN TOTAL: 0.3 mg/dL (ref 0.3–1.2)
BUN: 10 mg/dL (ref 6–20)
CO2: 25 mmol/L (ref 22–32)
Calcium: 9.6 mg/dL (ref 8.9–10.3)
Chloride: 106 mmol/L (ref 101–111)
Creatinine, Ser: 0.71 mg/dL (ref 0.44–1.00)
GFR calc Af Amer: >60 mL/min (ref >60)
GFR calc non Af Amer: >60 mL/min (ref >60)
GLUCOSE: 84 mg/dL (ref 65–99)
Potassium: 3.7 mmol/L (ref 3.5–5.1)
Sodium: 138 mmol/L (ref 135–145)
Total Protein: 7.4 g/dL (ref 6.5–8.1)

## 2014-12-03 LAB — SALICYLATE LEVEL: Salicylate Lvl: <4 mg/dL (ref 2.8–30.0)

## 2014-12-03 LAB — ACETAMINOPHEN LEVEL

## 2014-12-03 LAB — POCT PREGNANCY, URINE: Preg Test, Ur: NEGATIVE

## 2014-12-03 LAB — ETHANOL: Alcohol, Ethyl (B): <5 mg/dL (ref <5)

## 2014-12-03 NOTE — ED Notes (Signed)

## 2014-12-03 NOTE — ED Notes (Signed)
BEHAVIORAL HEALTH ROUNDING Patient sleeping: Yes.   Patient alert and oriented: yes Behavior appropriate: Yes.  ;  Nutrition and fluids offered: No Toileting and hygiene offered: No Sitter present: no Law enforcement present: Yes  

## 2014-12-03 NOTE — ED Notes (Signed)
BEHAVIORAL HEALTH ROUNDING Patient sleeping: Yes.   Patient alert and oriented: not applicable Behavior appropriate: Yes.    Nutrition and fluids offered: No Toileting and hygiene offered: No Sitter present: q15 minute observations and security camera monitoring Law enforcement present: Yes [employer] 

## 2014-12-03 NOTE — ED Notes (Signed)
IVC/Consult pending/All paperwork on chart

## 2014-12-03 NOTE — ED Notes (Signed)
BEHAVIORAL HEALTH ROUNDING Patient sleeping: No. Patient alert and oriented: yes Behavior appropriate: Yes.  ;  Nutrition and fluids offered: Yes  Toileting and hygiene offered: Yes  Sitter present: no Law enforcement present: Yes   

## 2014-12-03 NOTE — ED Notes (Signed)
Report received from [place]. Pt to move to [facility] room 2.
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
## 2014-12-03 NOTE — ED Notes (Signed)
Called [facility] RN to verify pt transfer to [facility].

## 2014-12-03 NOTE — ED Notes (Signed)
Pt brought into ED BHU via sally port accompanied by RN Adele Barthel and [employer] Offic[employer]. Pt was wanded with metal detector for safety by [employer] officer. Patient oriented to unit/care area: Pt informed of unit policies and procedures.  Informed that, for their safety, care areas are designed for safety and monitored by security cameras at all times; and visiting hours explained to patient. Patient verbalizes understanding, and verbal contract for safety obtained.Pt shown to their room.

## 2014-12-03 NOTE — ED Provider Notes (Signed)
[facility] Emergency Department Provider Note  ____________________________________________  Time seen: Approximately 5 PM  I have reviewed the triage vital signs and the nursing notes.   HISTORY  Chief Complaint Suicidal and Hallucinations    HPI Melanie Bell is a 24 y.o. female with a history of schizoaffective disorder with hallucinations who is presenting today with command hallucinations which are telling her to kill herself. She has no specific plan and has not attempted anything. She denies any ingestions including any foreign bodies. She was recently admitted to the hospital for ingesting a safety pin. Denies pain at this time.    Past Medical History  Diagnosis Date  . Hallucinations 09/30/2014    Sizoaffective  . Depression   . Anxiety   . Hypertension   . Tardive dyskinesia 10/2014    recent onset  . GERD (gastroesophageal reflux disease)     Patient Active Problem List   Diagnosis Date Noted  . 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 Schizoaffective disorder, bipolar type 11/06/2014  . Tardive dyskinesia 10/06/2014  . Tobacco use disorder 09/30/2014  . H/O foreign body ingestion 09/29/2014  . Hypothyroid 09/29/2014  . Hypertension 09/29/2014  . Constipation 09/29/2014    Past Surgical History  Procedure Laterality Date  . Breast lumpectomy    . Wisdom tooth extraction    . Abdominal surgery      "years ago" to remove foreign objects    Current Outpatient Rx  Name  Route  Sig  Dispense  Refill  . amLODipine (NORVASC) 2.5 MG tablet   Oral   Take 1 tablet (2.5 mg total) by mouth daily.   30 tablet   0   . benztropine (COGENTIN) 1 MG tablet   Oral   Take 1 tablet (1 mg total) by mouth at bedtime.   30 tablet   0   . docusate sodium (COLACE) 100 MG capsule   Oral   Take 2 capsules (200 mg total) by mouth 2 (two) times daily.   120 capsule   0   . fluPHENAZine (PROLIXIN) 10 MG tablet   Oral   Take 1 tablet  (10 mg total) by mouth at bedtime.   30 tablet   0   . fluPHENAZine (PROLIXIN) 5 MG tablet   Oral   Take 1 tablet (5 mg total) by mouth daily. At 4 pm   60 tablet   0   . levothyroxine (SYNTHROID, LEVOTHROID) 75 MCG tablet   Oral   Take 1 tablet (75 mcg total) by mouth daily before breakfast.   30 tablet   0   . lurasidone (LATUDA) 80 MG TABS tablet   Oral   Take 1 tablet (80 mg total) by mouth daily with supper.   30 tablet   0   . OXcarbazepine (TRILEPTAL) 150 MG tablet   Oral   Take 1 tablet (150 mg total) by mouth 2 (two) times daily.   60 tablet   0   . senna (SENOKOT) 8.6 MG TABS tablet   Oral   Take 2 tablets (17.2 mg total) by mouth at bedtime.   60 each   0   . traZODone (DESYREL) 50 MG tablet   Oral   Take 1 tablet (50 mg total) by mouth at bedtime as needed for sleep.   30 tablet   0   . venlafaxine XR (EFFEXOR-XR) 150 MG 24 hr capsule   Oral   Take 1 capsule (150 mg total) by  mouth daily with breakfast.   30 capsule   0     Allergies Betadine; Shellfish-derived products; and Iodine  Family History  Problem Relation Age of Onset  . Depression Mother   . Hypertension Mother     Social History History  Substance Use Topics  . Smoking status: Current Every Day Smoker -- 0.50 packs/day for 3 years    Types: Cigarettes  . Smokeless tobacco: Not on file  . Alcohol Use: No    Review of Systems Constitutional: No fever/chills Eyes: No visual changes. ENT: No sore throat. Cardiovascular: Denies chest pain. Respiratory: Denies shortness of breath. Gastrointestinal: No abdominal pain.  No nausea, no vomiting.  No diarrhea.  No constipation. Genitourinary: Negative for dysuria. Musculoskeletal: Negative for back pain. Skin: Negative for rash. Neurological: Negative for headaches, focal weakness or numbness. Psychiatric:As above  10-point ROS otherwise negative.  ____________________________________________   PHYSICAL EXAM:  VITAL  SIGNS: ED Triage Vitals  Enc Vitals Group     BP 12/03/14 1608 113/72 mmHg     Pulse Rate 12/03/14 1607 81     Resp 12/03/14 1607 18     Temp 12/03/14 1607 98.7 F (37.1 C)     Temp src --      SpO2 12/03/14 1607 96 %     Weight 12/03/14 1607 220 lb (99.791 kg)     Height 12/03/14 1607 5\' 6"  (1.676 m)     Head Cir --      Peak Flow --      Pain Score 12/03/14 1608 0     Pain Loc --      Pain Edu? --      Excl. in [place]? --     Constitutional: Alert and oriented. Well appearing and in no acute distress. Eyes: Conjunctivae are normal. PERRL. EOMI. Head: Atraumatic. Nose: No congestion/rhinnorhea. Mouth/Throat: Mucous membranes are moist.  Oropharynx non-erythematous. Neck: No stridor.   Cardiovascular: Normal rate, regular rhythm. Grossly normal heart sounds.  Good peripheral circulation. Respiratory: Normal respiratory effort.  No retractions. Lungs CTAB. Gastrointestinal: Soft and nontender. No distention. No abdominal bruits. No CVA tenderness. Musculoskeletal: No lower extremity tenderness nor edema.  No joint effusions. Neurologic:  Normal speech and language. No gross focal neurologic deficits are appreciated. No gait instability. Skin:  Skin is warm, dry and intact. No rash noted. Psychiatric: Mood and affect are normal. Speech and behavior are normal.  ____________________________________________   LABS (all labs ordered are listed, but only abnormal results are displayed)  Labs Reviewed  COMPREHENSIVE METABOLIC PANEL - Abnormal; Notable for the following:    ALT 8 (*)    All other components within normal limits  ACETAMINOPHEN LEVEL - Abnormal; Notable for the following:    Acetaminophen (Tylenol), Serum <10 (*)    All other components within normal limits  ETHANOL  SALICYLATE LEVEL  CBC  URINE DRUG SCREEN, QUALITATIVE ([facility] ONLY)  POC URINE PREG, ED  POCT PREGNANCY, URINE    ____________________________________________  EKG   ____________________________________________  RADIOLOGY   ____________________________________________   PROCEDURES    ____________________________________________   INITIAL IMPRESSION / ASSESSMENT AND PLAN / ED COURSE  Pertinent labs & imaging results that were available during my care of the patient were reviewed by me and considered in my medical decision making (see chart for details).  Involuntary commitment completed for this patient because of her complaint of suicidality with command hallucinations. Pending psychiatry disposition. ____________________________________________   FINAL CLINICAL IMPRESSION(S) / ED DIAGNOSES  Acute auditory  hallucinations with suicidal ideation. Return visit.    Orbie Pyo, MD 12/03/14 [phone]

## 2014-12-03 NOTE — ED Notes (Signed)
Pt dropped off at the ED by group home representative from [place] home with current MAR..the patient comes voluntary stating "I am hearing voices that are telling me to kill myself, saying You are not worth it"..states know plan to harm self at present.Melanie Bell

## 2014-12-03 NOTE — ED Notes (Signed)
Patient given afternoon snack consisting of graham crackers, peanut butter, and drink.

## 2014-12-03 NOTE — ED Notes (Addendum)
Patient assigned to appropriate care area. Patient oriented to unit/care area: Informed that, for their safety, care areas are designed for safety and monitored by security cameras at all times; and visiting hours explained to patient. Patient verbalizes understanding, and verbal contract for safety obtained.  BEHAVIORAL HEALTH ROUNDING Patient sleeping: No. Patient alert and oriented: yes Behavior appropriate: Yes.   Nutrition and fluids offered: Yes  Toileting and hygiene offered: Yes  Sitter present: q15 min observations and security camera monitoring Law enforcement present: Yes [employer]  ENVIRONMENTAL ASSESSMENT Potentially harmful objects out of patient reach: Yes.   Personal belongings secured: Yes.   Patient dressed in hospital provided attire only: Yes.   Plastic bags out of patient reach: Yes.   Patient care equipment removed: Yes.   Equipment and supplies removed from bottom of stretcher: Yes.   Potentially toxic materials out of patient reach: Yes.   Sharps container removed or out of patient reach: Yes.    ED BHU [place] Is the patient under IVC or is there intent for IVC: Yes.    Is IVC current? Yes Is the patient medically cleared: Yes.   Is there vacancy in the ED BHU: Yes.   Is the population mix appropriate for patient: Yes.   Is the patient awaiting placement in inpatient or outpatient setting: Yes.   Has the patient had a psychiatric consult: Yes.   Survey of unit performed for contraband, proper placement and condition of furniture, tampering with fixtures in bathroom, shower, and each patient room: Yes.  ; Findings: none APPEARANCE/BEHAVIOR Cooperative,calm NEURO ASSESSMENT Orientation: A&O x4 Hallucinations: none noted at this time Speech: Normal Gait: normal RESPIRATORY ASSESSMENT Breathing Pattern-regular, no respiratory distress noted CARDIOVASCULAR ASSESSMENT Skin color appropriate for age and race GASTROINTESTINAL ASSESSMENT no  GI distress noted EXTREMITIES Moves all extremities, no distress noted PLAN OF CARE Provide calm/safe environment. Vital signs assessed twice daily. ED BHU Assessment once each 12-hour shift. Collaborate with intake RN daily or as condition indicates. Assure the ED provider has rounded once each shift. Provide and encourage hygiene. Provide redirection as needed. Assess for escalating behavior; address immediately and inform ED provider.  Assess family dynamic and appropriateness for visitation as needed: Yes.  Educate the patient/family about BHU procedures/visitation: Yes.

## 2014-12-04 DIAGNOSIS — F250 Schizoaffective disorder, bipolar type: Secondary | ICD-10-CM | POA: Diagnosis not present

## 2014-12-04 MED ORDER — BENZTROPINE MESYLATE 1 MG PO TABS
ORAL_TABLET | ORAL | Status: AC
Start: 2014-12-04 — End: 2014-12-04
  Administered 2014-12-04: 1 mg via ORAL
  Filled 2014-12-04: qty 1

## 2014-12-04 MED ORDER — AMLODIPINE BESYLATE 5 MG PO TABS
ORAL_TABLET | ORAL | Status: AC
  Administered 2014-12-04: 2.5 mg via ORAL
  Filled 2014-12-04: qty 1

## 2014-12-04 MED ORDER — FLUPHENAZINE HCL 5 MG PO TABS
ORAL_TABLET | ORAL | Status: AC
  Administered 2014-12-04: 10 mg via ORAL
  Filled 2014-12-04: qty 2

## 2014-12-04 MED ORDER — LURASIDONE HCL 40 MG PO TABS
80.0000 mg | ORAL_TABLET | Freq: Every day | ORAL | Status: DC
  Administered 2014-12-04: 80 mg via ORAL

## 2014-12-04 MED ORDER — BENZTROPINE MESYLATE 1 MG PO TABS
1.0000 mg | ORAL_TABLET | Freq: Every day | ORAL | Status: DC
  Administered 2014-12-04: 1 mg via ORAL

## 2014-12-04 MED ORDER — FLUPHENAZINE HCL 5 MG PO TABS
ORAL_TABLET | ORAL | Status: AC
  Administered 2014-12-04: 5 mg via ORAL
  Filled 2014-12-04: qty 1

## 2014-12-04 MED ORDER — LEVOTHYROXINE SODIUM 75 MCG PO TABS
75.0000 ug | ORAL_TABLET | Freq: Every day | ORAL | Status: DC
Start: 2014-12-05 — End: 2014-12-05
  Administered 2014-12-05: 75 ug via ORAL
  Filled 2014-12-04: qty 1

## 2014-12-04 MED ORDER — FLUPHENAZINE HCL 5 MG PO TABS
5.0000 mg | ORAL_TABLET | Freq: Every day | ORAL | Status: DC
  Administered 2014-12-04: 5 mg via ORAL

## 2014-12-04 MED ORDER — AMLODIPINE BESYLATE 5 MG PO TABS
2.5000 mg | ORAL_TABLET | Freq: Every day | ORAL | Status: DC
  Administered 2014-12-04 – 2014-12-05 (×2): 2.5 mg via ORAL
  Filled 2014-12-04: qty 1

## 2014-12-04 MED ORDER — TRAZODONE HCL 50 MG PO TABS
50.0000 mg | ORAL_TABLET | Freq: Every day | ORAL | Status: DC
  Administered 2014-12-04: 50 mg via ORAL

## 2014-12-04 MED ORDER — OXCARBAZEPINE 150 MG PO TABS
150.0000 mg | ORAL_TABLET | Freq: Two times a day (BID) | ORAL | Status: DC
  Administered 2014-12-04 – 2014-12-05 (×2): 150 mg via ORAL
  Filled 2014-12-04 (×5): qty 1

## 2014-12-04 MED ORDER — LURASIDONE HCL 40 MG PO TABS
ORAL_TABLET | ORAL | Status: AC
  Administered 2014-12-04: 80 mg via ORAL
  Filled 2014-12-04: qty 2

## 2014-12-04 MED ORDER — TRAZODONE HCL 50 MG PO TABS
ORAL_TABLET | ORAL | Status: AC
  Administered 2014-12-04: 50 mg via ORAL
  Filled 2014-12-04: qty 1

## 2014-12-04 MED ORDER — DOCUSATE SODIUM 100 MG PO CAPS
200.0000 mg | ORAL_CAPSULE | Freq: Two times a day (BID) | ORAL | Status: DC
  Administered 2014-12-04 – 2014-12-05 (×2): 200 mg via ORAL
  Filled 2014-12-04: qty 2

## 2014-12-04 MED ORDER — FLUPHENAZINE HCL 5 MG PO TABS
10.0000 mg | ORAL_TABLET | Freq: Every day | ORAL | Status: DC
  Administered 2014-12-04: 10 mg via ORAL

## 2014-12-04 MED ORDER — VENLAFAXINE HCL ER 75 MG PO CP24
150.0000 mg | ORAL_CAPSULE | Freq: Every day | ORAL | Status: DC
  Administered 2014-12-05: 150 mg via ORAL
  Filled 2014-12-04: qty 2

## 2014-12-04 MED ORDER — DOCUSATE SODIUM 100 MG PO CAPS
ORAL_CAPSULE | ORAL | Status: AC
  Administered 2014-12-04: 200 mg via ORAL
  Filled 2014-12-04: qty 2

## 2014-12-04 NOTE — ED Notes (Signed)
Medications given to pt. Pt tolerated medication administration well. Updated pt on current plan of care. No further needs at this time.

## 2014-12-04 NOTE — ED Notes (Signed)
BEHAVIORAL HEALTH ROUNDING Patient sleeping: Yes.   Patient alert and oriented: eyes closed  Appears asleep Behavior appropriate: Yes.  ; If no, describe:  Nutrition and fluids offered: Yes  Toileting and hygiene offered: sleeping Sitter present: q 15 minute observations and security camera monitoring Law enforcement present: yes  [facility] 

## 2014-12-04 NOTE — ED Notes (Signed)

## 2014-12-04 NOTE — ED Notes (Signed)
BEHAVIORAL HEALTH ROUNDING Patient sleeping: No. Patient alert and oriented: yes Behavior appropriate: Yes.  ; If no, describe:  Nutrition and fluids offered: yes Toileting and hygiene offered: Yes  Sitter present: q15 minute observations and security camera monitoring Law enforcement present: Yes  [facility]  

## 2014-12-04 NOTE — ED Provider Notes (Signed)
-----------------------------------------   6:44 AM on 12/04/2014 -----------------------------------------   Blood pressure 113/72, pulse 81, temperature 98.7 F (37.1 C), resp. rate 18, height 5\' 6"  (1.676 m), weight 220 lb (99.791 kg), SpO2 96 %.  The patient had no acute events since last update.  Calm and cooperative at this time.  Disposition is pending per Psychiatry/Behavioral Medicine team recommendations.     Paulette Blanch, MD 12/04/14 [phone]

## 2014-12-04 NOTE — ED Notes (Signed)
BEHAVIORAL HEALTH ROUNDING Patient sleeping: Yes.   Patient alert and oriented: not applicable Behavior appropriate: Yes.    Nutrition and fluids offered: No Toileting and hygiene offered: No Sitter present: q15 minute observations and security camera monitoring Law enforcement present: Yes [employer] 

## 2014-12-04 NOTE — ED Notes (Signed)
Lunch provided along with an extra drink   Appropriate to stimulation  No verbalized needs or concerns at this time  NAD assessed  Continue to monitor

## 2014-12-04 NOTE — ED Notes (Signed)
Pt is currently in the shower.

## 2014-12-04 NOTE — ED Notes (Signed)
Breakfast provided along with an extra drink    Appropriate to stimulation  No verbalized needs or concerns at this time  NAD assessed  Continue to monitor

## 2014-12-04 NOTE — ED Notes (Signed)
Patient observed lying in bed with eyes closed  Even, unlabored respirations observed   NAD pt appears to be sleeping  I will continue to monitor along with every 15 minute visual observations and ongoing security camera monitoring    

## 2014-12-04 NOTE — ED Notes (Signed)
No am meds ordered at this time  meds have been reconciled  Psych consult pending

## 2014-12-04 NOTE — ED Notes (Signed)
Supper provided along with an extra drink  Pt observed with no unusual behavior  Appropriate to stimulation  No verbalized needs or concerns at this time  NAD assessed  Continue to monitor 

## 2014-12-04 NOTE — Consult Note (Signed)
[facility] Face-to-Face Psychiatry Consult   Reason for Consult:  Consult for this 24 year old woman well known to our emergency room and admission unit because of her history of schizoaffective disorder and multiple hospitalizations. Referring Physician:  Edd Fabian Patient Identification: Melanie Bell MRN:  [id number] Principal Diagnosis: Schizoaffective disorder, bipolar type Diagnosis:   Patient Active Problem List   Diagnosis Date Noted  . 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 Schizoaffective disorder, bipolar type [F25.0] 11/06/2014  . Tardive dyskinesia [G24.01] 10/06/2014  . Tobacco use disorder [Z72.0] 09/30/2014  . H/O foreign body ingestion [Z87.821] 09/29/2014  . Hypothyroid [E03.9] 09/29/2014  . Hypertension [I10] 09/29/2014  . Constipation [K59.00] 09/29/2014    Total Time spent with patient: 1 hour  Subjective:   Melanie Bell is a 24 y.o. female patient admitted with "I'm hearing voices telling me to kill myself. I'm feeling suicidal.".  HPI:  Patient reports that in the last day she started to have auditory hallucinations again. She hears a voice telling her that she should kill her self and that she is worthless. Her mood has been very sad. She is having frequent crying spells. Feeling very withdrawn. Having actual thoughts about wanting to kill herself by swallowing dangerous objects. Fortunately she has not actually swallowed any pins or other dangerous objects this time. She says she has been compliant with her medicine. She complains that the staff at the group home never take time to sit down and talk with her about her emotional problems. No clear precipitant to this.  History of schizoaffective disorder possible developmental disability. She has a history of swallowing open safety pins and other dangerous objects when her mood is bad. She was just recently discharged from the hospital after ingesting an open safety pin and having it removed. When she  is in the hospital she tends to feel better but back at the group home things tend to fall apart. Patient has been unable to have an act team here in our community because her insurance is tied to [place]. In the past when she was able to have an act team she was doing much better. Has been on multiple medications. Current combination of Prolixin and Trileptal Effexor  Social history: Patient resides in a group home. She feels frustrated and lonely. Her closest relative is her mother who lives in [place] I believe.  Family history: Not reviewed.  Substance abuse history: No abuse of alcohol or drugs. Regular tobacco use.  Medical history: Hypothyroid chronic constipation high blood pressure. Past history of injuries to her head from head banging.  Current medications: See list under outpatient medicines. HPI Elements:   Quality:  Depressed mood tearfulness suicidal thoughts and hallucinations. Severity:  Severe with serious thoughts of killing herself. Timing:  Worse for the last 2 days. Duration:  Chronic problem. Context:  Isolated and lack of fully appropriate outpatient care.  Past Medical History:  Past Medical History  Diagnosis Date  . Hallucinations 09/30/2014    Sizoaffective  . Depression   . Anxiety   . Hypertension   . Tardive dyskinesia 10/2014    recent onset  . GERD (gastroesophageal reflux disease)     Past Surgical History  Procedure Laterality Date  . Breast lumpectomy    . Wisdom tooth extraction    . Abdominal surgery      "years ago" to remove foreign objects   Family History:  Family History  Problem Relation Age of Onset  . Depression Mother   .  Hypertension Mother    Social History:  History  Alcohol Use No     History  Drug Use No    History   Social History  . Marital Status: Single    Spouse Name: N/A  . Number of Children: N/A  . Years of Education: N/A   Social History Main Topics  . Smoking status: Current Every Day Smoker --  0.50 packs/day for 3 years    Types: Cigarettes  . Smokeless tobacco: Not on file  . Alcohol Use: No  . Drug Use: No  . Sexual Activity: No   Other Topics Concern  . None   Social History Narrative   Additional Social History:    Pain Medications: None Reported Prescriptions: None Reported Over the Counter: None Reported History of alcohol / drug use?: No history of alcohol / drug abuse Longest period of sobriety (when/how long):  (None Reported) Negative Consequences of Use:  (None Reported) Withdrawal Symptoms:  (None Reported)                     Allergies:   Allergies  Allergen Reactions  . Betadine [Povidone Iodine] Other (See Comments)    Reaction:  Unknown   . Shellfish-Derived Products Other (See Comments)    Reaction:  Unknown   . Iodine Rash    Labs:  Results for orders placed or performed during the hospital encounter of 12/03/14 (from the past 48 hour(s))  Urine Drug Screen, Qualitative ([place] only)     Status: None   Collection Time: 12/03/14  4:13 PM  Result Value Ref Range   Tricyclic, Ur Screen NONE DETECTED NONE DETECTED   Amphetamines, Ur Screen NONE DETECTED NONE DETECTED   MDMA (Ecstasy)Ur Screen NONE DETECTED NONE DETECTED   Cocaine Metabolite,Ur [place] NONE DETECTED NONE DETECTED   Opiate, Ur Screen NONE DETECTED NONE DETECTED   Phencyclidine (PCP) Ur S NONE DETECTED NONE DETECTED   Cannabinoid 50 Ng, Ur [place] NONE DETECTED NONE DETECTED   Barbiturates, Ur Screen NONE DETECTED NONE DETECTED   Benzodiazepine, Ur Scrn NONE DETECTED NONE DETECTED   Methadone Scn, Ur NONE DETECTED NONE DETECTED    Comment: (NOTE) 371  Tricyclics, urine               Cutoff 1000 ng/mL 200  Amphetamines, urine             Cutoff 1000 ng/mL 300  MDMA (Ecstasy), urine           Cutoff 500 ng/mL 400  Cocaine Metabolite, urine       Cutoff 300 ng/mL 500  Opiate, urine                   Cutoff 300 ng/mL 600  Phencyclidine (PCP), urine      Cutoff 25 ng/mL 700   Cannabinoid, urine              Cutoff 50 ng/mL 800  Barbiturates, urine             Cutoff 200 ng/mL 900  Benzodiazepine, urine           Cutoff 200 ng/mL 1000 Methadone, urine                Cutoff 300 ng/mL 1100 1200 The urine drug screen provides only a preliminary, unconfirmed 1300 analytical test result and should not be used for non-medical 1400 purposes. Clinical consideration and professional judgment should 1500 be applied to any positive drug screen  result due to possible 1600 interfering substances. A more specific alternate chemical method 1700 must be used in order to obtain a confirmed analytical result.  1800 Gas chromato graphy / mass spectrometry (GC/MS) is the preferred 1900 confirmatory method.   Comprehensive metabolic panel     Status: Abnormal   Collection Time: 12/03/14  4:14 PM  Result Value Ref Range   Sodium 138 135 - 145 mmol/L   Potassium 3.7 3.5 - 5.1 mmol/L   Chloride 106 101 - 111 mmol/L   CO2 25 22 - 32 mmol/L   Glucose, Bld 84 65 - 99 mg/dL   BUN 10 6 - 20 mg/dL   Creatinine, Ser 0.71 0.44 - 1.00 mg/dL   Calcium 9.6 8.9 - 10.3 mg/dL   Total Protein 7.4 6.5 - 8.1 g/dL   Albumin 4.3 3.5 - 5.0 g/dL   AST 15 15 - 41 U/L   ALT 8 (L) 14 - 54 U/L   Alkaline Phosphatase 75 38 - 126 U/L   Total Bilirubin 0.3 0.3 - 1.2 mg/dL   GFR calc non Af Amer >60 >60 mL/min   GFR calc Af Amer >60 >60 mL/min    Comment: (NOTE) The eGFR has been calculated using the CKD EPI equation. This calculation has not been validated in all clinical situations. eGFR's persistently <60 mL/min signify possible Chronic Kidney Disease.    Anion gap 7 5 - 15  Ethanol (ETOH)     Status: None   Collection Time: 12/03/14  4:14 PM  Result Value Ref Range   Alcohol, Ethyl (B) <5 <5 mg/dL    Comment:        LOWEST DETECTABLE LIMIT FOR SERUM ALCOHOL IS 5 mg/dL FOR MEDICAL PURPOSES ONLY   Salicylate level     Status: None   Collection Time: 12/03/14  4:14 PM  Result Value Ref  Range   Salicylate Lvl <3.8 2.8 - 30.0 mg/dL  Acetaminophen level     Status: Abnormal   Collection Time: 12/03/14  4:14 PM  Result Value Ref Range   Acetaminophen (Tylenol), Serum <10 (L) 10 - 30 ug/mL    Comment:        THERAPEUTIC CONCENTRATIONS VARY SIGNIFICANTLY. A RANGE OF 10-30 ug/mL MAY BE AN EFFECTIVE CONCENTRATION FOR MANY PATIENTS. HOWEVER, SOME ARE BEST TREATED AT CONCENTRATIONS OUTSIDE THIS RANGE. ACETAMINOPHEN CONCENTRATIONS >150 ug/mL AT 4 HOURS AFTER INGESTION AND >50 ug/mL AT 12 HOURS AFTER INGESTION ARE OFTEN ASSOCIATED WITH TOXIC REACTIONS.   CBC     Status: None   Collection Time: 12/03/14  4:14 PM  Result Value Ref Range   WBC 9.0 3.6 - 11.0 K/uL   RBC 4.21 3.80 - 5.20 MIL/uL   Hemoglobin 12.7 12.0 - 16.0 g/dL   HCT 38.1 35.0 - 47.0 %   MCV 90.6 80.0 - 100.0 fL   MCH 30.1 26.0 - 34.0 pg   MCHC 33.2 32.0 - 36.0 g/dL   RDW 12.8 11.5 - 14.5 %   Platelets 187 150 - 440 K/uL  Pregnancy, urine POC     Status: None   Collection Time: 12/03/14  4:17 PM  Result Value Ref Range   Preg Test, Ur NEGATIVE NEGATIVE    Comment:        THE SENSITIVITY OF THIS METHODOLOGY IS >24 mIU/mL     Vitals: Blood pressure 113/72, pulse 78, temperature 98.2 F (36.8 C), temperature source Oral, resp. rate 18, height _0  (1.676 m), weight 99.791 kg (220 lb), SpO2  97 %.  Risk to Self: Suicidal Ideation: Yes-Currently Present Suicidal Intent: Yes-Currently Present Is patient at risk for suicide?: Yes Suicidal Plan?: Yes-Currently Present (Having command hallucinations ) Specify Current Suicidal Plan: No specific plan shared Access to Means: Yes Specify Access to Suicidal Means: None reported What has been your use of drugs/alcohol within the last 12 months?: None Reported How many times?: 5 Other Self Harm Risks: History of swallowing items and non food items (History of reported command hallucinations) Triggers for Past Attempts: Unpredictable,  Hallucinations Intentional Self Injurious Behavior: Bruising Comment - Self Injurious Behavior: Swallowing nonfood items Risk to Others: Homicidal Ideation: No Thoughts of Harm to Others: No Current Homicidal Intent: No Current Homicidal Plan: No Access to Homicidal Means: No Identified Victim: None Reported History of harm to others?: No Assessment of Violence: None Noted Violent Behavior Description: None Reported Does patient have access to weapons?: No Criminal Charges Pending?: No Does patient have a court date: No Prior Inpatient Therapy: Prior Inpatient Therapy: Yes Prior Therapy Dates: 10/2014, 09/2014, 08/2014, 08/2013, 01/2013 Prior Therapy Facilty/Provider(s): [facility] Reason for Treatment: depression; Schizoaffective Prior Outpatient Therapy: Prior Outpatient Therapy: Yes Prior Therapy Dates: quarterly Prior Therapy Facilty/Provider(s): [facility] Reason for Treatment: Depression; Scizoaffective Does patient have an ACCT team?: No Does patient have Intensive In-House Services?  : No Does patient have [facility] services? : No Does patient have [facility] services?: No  No current facility-administered medications for this encounter.   Current Outpatient Prescriptions  Medication Sig Dispense Refill  . amLODipine (NORVASC) 2.5 MG tablet Take 1 tablet (2.5 mg total) by mouth daily. 30 tablet 0  . benztropine (COGENTIN) 1 MG tablet Take 1 tablet (1 mg total) by mouth at bedtime. 30 tablet 0  . docusate sodium (COLACE) 100 MG capsule Take 2 capsules (200 mg total) by mouth 2 (two) times daily. 120 capsule 0  . fluPHENAZine (PROLIXIN) 10 MG tablet Take 1 tablet (10 mg total) by mouth at bedtime. 30 tablet 0  . fluPHENAZine (PROLIXIN) 5 MG tablet Take 1 tablet (5 mg total) by mouth daily. At 4 pm 60 tablet 0  . levothyroxine (SYNTHROID, LEVOTHROID) 75 MCG tablet Take 1 tablet (75 mcg total) by mouth daily before breakfast. 30 tablet 0  . lurasidone (LATUDA) 80 MG TABS tablet Take 1 tablet (80  mg total) by mouth daily with supper. 30 tablet 0  . OXcarbazepine (TRILEPTAL) 150 MG tablet Take 1 tablet (150 mg total) by mouth 2 (two) times daily. 60 tablet 0  . senna (SENOKOT) 8.6 MG TABS tablet Take 2 tablets (17.2 mg total) by mouth at bedtime. 60 each 0  . traZODone (DESYREL) 50 MG tablet Take 1 tablet (50 mg total) by mouth at bedtime as needed for sleep. 30 tablet 0  . venlafaxine XR (EFFEXOR-XR) 150 MG 24 hr capsule Take 1 capsule (150 mg total) by mouth daily with breakfast. 30 capsule 0    Musculoskeletal: Strength & Muscle Tone: within normal limits Gait & Station: normal Patient leans: N/A  Psychiatric Specialty Exam: Physical Exam  Constitutional: She appears well-developed and well-nourished.  HENT:  Head: Normocephalic and atraumatic.  Eyes: Conjunctivae are normal. Pupils are equal, round, and reactive to light.  Neck: Normal range of motion.  Cardiovascular: Normal heart sounds.   Respiratory: Effort normal.  GI: Soft.  Musculoskeletal: Normal range of motion.  Neurological: She is alert.  Skin: Skin is warm and dry.  Psychiatric: Her speech is delayed. She is withdrawn. Cognition and memory are impaired. She  expresses impulsivity. She exhibits a depressed mood. She expresses suicidal ideation.  Patient is somewhat disheveled. Cooperative with the interview. Makes good eye contact. Speech is quiet and decreased in amount but she is able to have a full conversation. She is reporting having auditory hallucinations today telling her to kill her self. She says she is very sad and upset and does not know why. She spoke to her mother and her mother seems to be improving in her health. Patient reports being frustrated with her staff. Continues to be tearful and have active suicidal thoughts.    Review of Systems  Constitutional: Negative.   HENT: Negative.   Eyes: Negative.   Respiratory: Negative.   Cardiovascular: Negative.   Gastrointestinal: Negative.    Musculoskeletal: Negative.   Skin: Negative.   Neurological: Negative.   Psychiatric/Behavioral: Positive for depression, suicidal ideas and hallucinations. The patient is nervous/anxious.     Blood pressure 113/72, pulse 78, temperature 98.2 F (36.8 C), temperature source Oral, resp. rate 18, height _0  (1.676 m), weight 99.791 kg (220 lb), SpO2 97 %.Body mass index is 35.53 kg/(m^2).  General Appearance: Disheveled  Eye Contact::  Good  Speech:  Clear and Coherent  Volume:  Decreased  Mood:  Dysphoric  Affect:  Tearful  Thought Process:  Tangential  Orientation:  Full (Time, Place, and Person)  Thought Content:  Hallucinations: Auditory Command:  Telling her to kill her self  Suicidal Thoughts:  Yes.  with intent/plan  Homicidal Thoughts:  No  Memory:  Immediate;   Good Recent;   Fair Remote;   Fair  Judgement:  Impaired  Insight:  Shallow  Psychomotor Activity:  Decreased  Concentration:  Fair  Recall:  [employer] of Knowledge:Fair  Language: Poor  Akathisia:  No  Handed:  Right  AIMS (if indicated):     Assets:  Desire for Improvement Housing Resilience  ADL's:  Intact  Cognition: WNL and Impaired,  Mild  Sleep:      Medical Decision Making: Review of Psycho-Social Stressors (1), Review or order clinical lab tests (1), Established Problem, Worsening (2), Review of Medication Regimen & Side Effects (2) and Review of New Medication or Change in Dosage (2)  Treatment Plan Summary: Medication management and Plan Patient will be once again admitted to the psychiatry ward. Continue outpatient medicines. Reviewed treatment plan. I think we can all admit that she would do better with an act team but I am afraid that that option may not be available. Close precautions in place. Regular diet.  Plan:  Recommend psychiatric Inpatient admission when medically cleared. Supportive therapy provided about ongoing stressors. Disposition: Admit as noted above  Alethia Berthold 12/04/2014 5:43 PM

## 2014-12-04 NOTE — ED Notes (Signed)
Snack provided by ED Tech. Pt observed with no unusual behavior. Appropriate to stimulation. No verbalized needs or concerns at this time. NAD assessed. Will continue to monitor.

## 2014-12-04 NOTE — ED Notes (Signed)
ED BHU [place] Is the patient under IVC or is there intent for IVC: Yes.   Is the patient medically cleared: Yes.   Is there vacancy in the ED BHU: Yes.   Is the population mix appropriate for patient: Yes.   Is the patient awaiting placement in inpatient or outpatient setting: No. Has the patient had a psychiatric consult: No. Survey of unit performed for contraband, proper placement and condition of furniture, tampering with fixtures in bathroom, shower, and each patient room: Yes.  ; Findings:  APPEARANCE/BEHAVIOR Calm and cooperative NEURO ASSESSMENT Orientation: oriented x3  Denies pain Hallucinations: No.None noted (Hallucinations) Speech: Normal Gait: normal RESPIRATORY ASSESSMENT even unlabored respirations  CARDIOVASCULAR ASSESSMENT Pulses equal  regular rate  Skin warm and dry GASTROINTESTINAL ASSESSMENT no GI complaint EXTREMITIES Full ROM PLAN OF CARE Provide calm/safe environment. Vital signs assessed twice daily. ED BHU Assessment once each 12-hour shift. Collaborate with intake RN daily or as condition indicates. Assure the ED provider has rounded once each shift. Provide and encourage hygiene. Provide redirection as needed. Assess for escalating behavior; address immediately and inform ED provider.  Assess family dynamic and appropriateness for visitation as needed: Yes.  ; If necessary, describe findings:  Educate the patient/family about BHU procedures/visitation: Yes.  ; If necessary, describe findings:

## 2014-12-05 ENCOUNTER — Inpatient Hospital Stay
Admission: EM | Admit: 2014-12-05 | Discharge: 2014-12-09 | DRG: 885 | Disposition: A | Discharge status: Home / Self Care | Payer: MEDICAID | Attending: Psychiatry | Admitting: Psychiatry

## 2014-12-05 DIAGNOSIS — Z8249 Family history of ischemic heart disease and other diseases of the circulatory system: Secondary | ICD-10-CM | POA: Diagnosis not present

## 2014-12-05 DIAGNOSIS — R440 Auditory hallucinations: Secondary | ICD-10-CM | POA: Diagnosis not present

## 2014-12-05 DIAGNOSIS — G4700 Insomnia, unspecified: Secondary | ICD-10-CM | POA: Diagnosis present

## 2014-12-05 DIAGNOSIS — Z9889 Other specified postprocedural states: Secondary | ICD-10-CM

## 2014-12-05 DIAGNOSIS — Z79899 Other long term (current) drug therapy: Secondary | ICD-10-CM

## 2014-12-05 DIAGNOSIS — R45851 Suicidal ideations: Secondary | ICD-10-CM | POA: Diagnosis present

## 2014-12-05 DIAGNOSIS — Z818 Family history of other mental and behavioral disorders: Secondary | ICD-10-CM | POA: Diagnosis not present

## 2014-12-05 DIAGNOSIS — Z888 Allergy status to other drugs, medicaments and biological substances status: Secondary | ICD-10-CM

## 2014-12-05 DIAGNOSIS — K5900 Constipation, unspecified: Secondary | ICD-10-CM | POA: Diagnosis present

## 2014-12-05 DIAGNOSIS — F250 Schizoaffective disorder, bipolar type: Principal | ICD-10-CM | POA: Diagnosis present

## 2014-12-05 DIAGNOSIS — E039 Hypothyroidism, unspecified: Secondary | ICD-10-CM | POA: Diagnosis present

## 2014-12-05 DIAGNOSIS — I10 Essential (primary) hypertension: Secondary | ICD-10-CM | POA: Diagnosis present

## 2014-12-05 DIAGNOSIS — N390 Urinary tract infection, site not specified: Secondary | ICD-10-CM | POA: Diagnosis present

## 2014-12-05 DIAGNOSIS — K219 Gastro-esophageal reflux disease without esophagitis: Secondary | ICD-10-CM | POA: Diagnosis present

## 2014-12-05 DIAGNOSIS — F17210 Nicotine dependence, cigarettes, uncomplicated: Secondary | ICD-10-CM | POA: Diagnosis present

## 2014-12-05 MED ORDER — TEMAZEPAM 15 MG PO CAPS: 15.0000 mg | ORAL_CAPSULE | Freq: Every evening | ORAL | Status: DC | PRN

## 2014-12-05 MED ORDER — ALUM & MAG HYDROXIDE-SIMETH 200-200-20 MG/5ML PO SUSP: 30.0000 mL | ORAL | Status: DC | PRN

## 2014-12-05 MED ORDER — VENLAFAXINE HCL ER 75 MG PO CP24
225.0000 mg | ORAL_CAPSULE | Freq: Every day | ORAL | Status: DC
  Administered 2014-12-06: 225 mg via ORAL
  Filled 2014-12-05: qty 3

## 2014-12-05 MED ORDER — FLUPHENAZINE HCL 5 MG PO TABS
5.0000 mg | ORAL_TABLET | Freq: Every day | ORAL | Status: DC
  Administered 2014-12-05 – 2014-12-08 (×4): 5 mg via ORAL
  Filled 2014-12-05 (×4): qty 1

## 2014-12-05 MED ORDER — LEVOTHYROXINE SODIUM 75 MCG PO TABS
75.0000 ug | ORAL_TABLET | Freq: Every day | ORAL | Status: DC
  Administered 2014-12-06 – 2014-12-09 (×4): 75 ug via ORAL
  Filled 2014-12-05: qty 1
  Filled 2014-12-05: qty 1.5
  Filled 2014-12-05 (×2): qty 1
  Filled 2014-12-05: qty 2
  Filled 2014-12-05: qty 1.5

## 2014-12-05 MED ORDER — TRAZODONE HCL 50 MG PO TABS: 50.0000 mg | ORAL_TABLET | Freq: Every day | ORAL | Status: DC

## 2014-12-05 MED ORDER — VENLAFAXINE HCL ER 75 MG PO CP24: 150.0000 mg | ORAL_CAPSULE | Freq: Every day | ORAL | Status: DC

## 2014-12-05 MED ORDER — FLUPHENAZINE HCL 5 MG PO TABS
10.0000 mg | ORAL_TABLET | Freq: Every day | ORAL | Status: DC
  Administered 2014-12-05 – 2014-12-08 (×4): 10 mg via ORAL
  Filled 2014-12-05 (×4): qty 2

## 2014-12-05 MED ORDER — AMLODIPINE BESYLATE 5 MG PO TABS
2.5000 mg | ORAL_TABLET | Freq: Every day | ORAL | Status: DC
  Administered 2014-12-06 – 2014-12-09 (×3): 2.5 mg via ORAL
  Filled 2014-12-05 (×4): qty 1

## 2014-12-05 MED ORDER — LURASIDONE HCL 40 MG PO TABS
80.0000 mg | ORAL_TABLET | Freq: Every day | ORAL | Status: DC
  Filled 2014-12-05: qty 2

## 2014-12-05 MED ORDER — ACETAMINOPHEN 325 MG PO TABS
650.0000 mg | ORAL_TABLET | Freq: Four times a day (QID) | ORAL | Status: DC | PRN
  Administered 2014-12-06 – 2014-12-08 (×6): 650 mg via ORAL
  Filled 2014-12-05 (×6): qty 2

## 2014-12-05 MED ORDER — DOCUSATE SODIUM 100 MG PO CAPS
200.0000 mg | ORAL_CAPSULE | Freq: Two times a day (BID) | ORAL | Status: DC
  Administered 2014-12-05 – 2014-12-09 (×8): 200 mg via ORAL
  Filled 2014-12-05 (×8): qty 2

## 2014-12-05 MED ORDER — LURASIDONE HCL 40 MG PO TABS
120.0000 mg | ORAL_TABLET | Freq: Every day | ORAL | Status: DC
  Administered 2014-12-05 – 2014-12-08 (×4): 120 mg via ORAL
  Filled 2014-12-05 (×4): qty 3

## 2014-12-05 MED ORDER — MAGNESIUM HYDROXIDE 400 MG/5ML PO SUSP: 30.0000 mL | Freq: Every day | ORAL | Status: DC | PRN

## 2014-12-05 MED ORDER — BENZTROPINE MESYLATE 1 MG PO TABS
1.0000 mg | ORAL_TABLET | Freq: Every day | ORAL | Status: DC
  Administered 2014-12-05 – 2014-12-08 (×4): 1 mg via ORAL
  Filled 2014-12-05 (×4): qty 1

## 2014-12-05 MED ORDER — OXCARBAZEPINE 150 MG PO TABS
150.0000 mg | ORAL_TABLET | Freq: Two times a day (BID) | ORAL | Status: DC
  Administered 2014-12-05 – 2014-12-09 (×8): 150 mg via ORAL
  Filled 2014-12-05 (×9): qty 1

## 2014-12-05 NOTE — ED Notes (Signed)
BEHAVIORAL HEALTH ROUNDING Patient sleeping: Yes.   Patient alert and oriented: yes Behavior appropriate: Yes.  ; If no, describe:  Nutrition and fluids offered: Yes  Toileting and hygiene offered: Yes  Sitter present: not applicable Law enforcement present: Yes  

## 2014-12-05 NOTE — Tx Team (Signed)
Initial Interdisciplinary Treatment Plan   PATIENT STRESSORS: Health problems Loss of grandmother   PATIENT STRENGTHS: Average or above average intelligence Motivation for treatment/growth   PROBLEM LIST: Problem List/Patient Goals Date to be addressed Date deferred Reason deferred Estimated date of resolution  Depression      Self harm behaviors                                                 DISCHARGE CRITERIA:  Improved stabilization in mood, thinking, and/or behavior Verbal commitment to aftercare and medication compliance  PRELIMINARY DISCHARGE PLAN: Outpatient therapy Return to previous living arrangement  PATIENT/FAMIILY INVOLVEMENT: This treatment plan has been presented to and reviewed with the patient, Melanie Bell, and/or family member. The patient and family have been given the opportunity to ask questions and make suggestions.  Melanie Bell 12/05/2014, 3:17 PM

## 2014-12-05 NOTE — ED Notes (Signed)
BEHAVIORAL HEALTH ROUNDING Patient sleeping: Yes.   Patient alert and oriented: eyes closed  Appears asleep Behavior appropriate: Yes.  ; If no, describe:  Nutrition and fluids offered: Yes  Toileting and hygiene offered: sleeping Sitter present: q 15 minute observations and security camera monitoring Law enforcement present: yes  [facility] 

## 2014-12-05 NOTE — BHH Suicide Risk Assessment (Signed)
[facility] Admission Suicide Risk Assessment   Nursing information obtained from:  Patient Demographic factors:  Adolescent or young adult, Caucasian Current Mental Status:  NA Loss Factors:  NA Historical Factors:  Prior suicide attempts, Anniversary of important loss Risk Reduction Factors:  Living with another person, especially a relative, Positive therapeutic relationship Total Time spent with patient: 1 hour Principal Problem: Schizoaffective disorder, bipolar type Diagnosis:   Patient Active Problem List   Diagnosis Date Noted  . 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 Schizoaffective disorder, bipolar type [F25.0] 11/06/2014  . Tardive dyskinesia [G24.01] 10/06/2014  . Tobacco use disorder [Z72.0] 09/30/2014  . H/O foreign body ingestion [Z87.821] 09/29/2014  . Hypothyroid [E03.9] 09/29/2014  . Hypertension [I10] 09/29/2014  . Constipation [K59.00] 09/29/2014     Continued Clinical Symptoms:  Alcohol Use Disorder Identification Test Final Score (AUDIT): 0 The "Alcohol Use Disorders Identification Test", Guidelines for Use in Primary Care, Second Edition.  World Pharmacologist [facility]). Score between 0-7:  no or low risk or alcohol related problems. Score between 8-15:  moderate risk of alcohol related problems. Score between 16-19:  high risk of alcohol related problems. Score 20 or above:  warrants further diagnostic evaluation for alcohol dependence and treatment.   CLINICAL FACTORS:   Bipolar Disorder:   Depressive phase Obsessive-Compulsive Disorder   Musculoskeletal: Strength & Muscle Tone: within normal limits Gait & Station: normal Patient leans: N/A  Psychiatric Specialty Exam: Physical Exam  Nursing note and vitals reviewed.   Review of Systems  All other systems reviewed and are negative.   Blood pressure 132/83, pulse 99, temperature 100 F (37.8 C), temperature source Oral, resp. rate 20, height 5' 7.5" (1.715 m), weight 96.163 kg (212  lb), SpO2 100 %.Body mass index is 32.69 kg/(m^2).  General Appearance: Casual  Eye Contact::  Good  Speech:  Normal Rate  Volume:  Normal  Mood:  Depressed  Affect:  Depressed  Thought Process:  Goal Directed  Orientation:  Full (Time, Place, and Person)  Thought Content:  Hallucinations: Command:  Telling her to kill herself  Suicidal Thoughts:  Yes.  without intent/plan  Homicidal Thoughts:  No  Memory:  Immediate;   Fair Recent;   Fair Remote;   Fair  Judgement:  Fair  Insight:  Fair  Psychomotor Activity:  Normal  Concentration:  Fair  Recall:  [employer] of [place]  Language: Fair  Akathisia:  No    AIMS (if indicated):     Assets:  Communication Skills Desire for Improvement Financial Resources/Insurance Housing Physical Health Resilience Social Support  Sleep:     Cognition: WNL  ADL's:  Intact     COGNITIVE FEATURES THAT CONTRIBUTE TO RISK:  None    SUICIDE RISK:   Moderate:  Frequent suicidal ideation with limited intensity, and duration, some specificity in terms of plans, no associated intent, good self-control, limited dysphoria/symptomatology, some risk factors present, and identifiable protective factors, including available and accessible social support.  PLAN OF CARE: Hospital admission, medication management, discharge planning.  Medical Decision Making:  New problem, with additional work up planned, Review of Psycho-Social Stressors (1), Review or order clinical lab tests (1), Review of Medication Regimen & Side Effects (2) and Review of New Medication or Change in Dosage (2)   [name] is a 24 year old female well known to [name] with a history of schizoaffective disorder bipolar type and a habit of swallowing foreign objects who came to the hospital complaining of auditory command hallucinations  telling her that she were worthless and commanding her to kill herself. She did not attempt to harm herself at this time around and came to the  hospital for help.  1. Suicidal ideation. The patient is able to contract for safety in the hospital.  2. Mood and psychosis. We will continue all her medications including Latuda, Prolixin, for psychosis, Trileptal for mood stabilization, and Effexor for depression.  3. Insomnia. We will continue trazodone.  4. Hypothyroidism. We'll continue Synthroid.  5. Hypertension. We'll continue Norvasc.  6. Constipation. We'll continue Colace.  7. Disposition. She will be discharged to her group home. Follow up with her regular provider.  I certify that inpatient services furnished can reasonably be expected to improve the patient's condition.   Alveda Vanhorne 12/05/2014, 3:36 PM

## 2014-12-05 NOTE — ED Provider Notes (Signed)
-----------------------------------------   7:49 AM on 12/05/2014 -----------------------------------------   Blood pressure 129/62, pulse 76, temperature 98.2 F (36.8 C), temperature source Oral, resp. rate 18, height 5\' 6"  (1.676 m), weight 220 lb (99.791 kg), SpO2 99 %.  The patient had no acute events since last update.  Calm and cooperative at this time.  Disposition is pending per Psychiatry/Behavioral Medicine team recommendations.     Paulette Blanch, MD 12/05/14 [phone]

## 2014-12-05 NOTE — ED Notes (Signed)
BEHAVIORAL HEALTH ROUNDING Patient sleeping: No. Patient alert and oriented: yes Behavior appropriate: Yes.  ; If no, describe:  Nutrition and fluids offered: No Toileting and hygiene offered: No Sitter present: no Law enforcement present: Yes  

## 2014-12-05 NOTE — H&P (Addendum)
Psychiatric Admission Assessment Adult  Patient Identification: Melanie Bell MRN:  [id number] Date of Evaluation:  12/05/2014 Chief Complaint:  Schizoaffective DO, Bipolar p Principal Diagnosis: Schizoaffective disorder, bipolar type Diagnosis:   Patient Active Problem List   Diagnosis Date Noted  . 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 Schizoaffective disorder, bipolar type [F25.0] 11/06/2014  . Tardive dyskinesia [G24.01] 10/06/2014  . Tobacco use disorder [Z72.0] 09/30/2014  . H/O foreign body ingestion [Z87.821] 09/29/2014  . Hypothyroid [E03.9] 09/29/2014  . Hypertension [I10] 09/29/2014  . Constipation [K59.00] 09/29/2014   History of Present Illness::   Identifying data. Melanie Bell is a 24 year old female with a history of schizoaffective disorder.  Chief complaint. "I have voices."   History of present illness. Melanie Bell has a long history of psychosis depression and mood instability. She has been hospitalized at [facility] times. She was discharged from here at the end of June. She returned to the hospital several times after she swallowed foreign objects which is her habit. She returns to the emergency room again complaining of severe depression with poor sleep, decreased appetite, anhedonia, feeling of guilt and hopelessness worthlessness, poor energy and concentration, social isolation, crying spells and now auditory hallucinations telling her that she is worthless and that she needs to kill herself. This time around she did not try to harm herself and came to the hospital for help. She denies excessive anxiety. She denies symptoms suggestive of bipolar mania. She is not paranoid or delusional. She reports good compliance with medication. There are no new social stressors or identifiable reasons for worsening of her condition. She does not use alcohol or drugs.   Past psychiatric history. Multiple psychiatric admissions for  depression psychosis or self-injurious behavior. She's been tried on multiple medications apparently current combination of Trileptal Latuda Prolixin and Effexor has been working the best.   Family psychiatric history. Mother with depression and anxiety.   Social history. She is disabled from mental illness. She lives in a group home and likes it there okay. There is limited social support since her mother divorced her stepfather and relocated to [place]. She no longer has support of her stepgrandmother.   Total Time spent with patient: 1 hour  Past Medical History:  Past Medical History  Diagnosis Date  . Hallucinations 09/30/2014    Sizoaffective  . Depression   . Anxiety   . Hypertension   . Tardive dyskinesia 10/2014    recent onset  . GERD (gastroesophageal reflux disease)     Past Surgical History  Procedure Laterality Date  . Breast lumpectomy    . Wisdom tooth extraction    . Abdominal surgery      "years ago" to remove foreign objects   Family History:  Family History  Problem Relation Age of Onset  . Depression Mother   . Hypertension Mother    Social History:  History  Alcohol Use No     History  Drug Use No    History   Social History  . Marital Status: Single    Spouse Name: N/A  . Number of Children: N/A  . Years of Education: N/A   Social History Main Topics  . Smoking status: Current Every Day Smoker -- 0.50 packs/day for 3 years    Types: Cigarettes  . Smokeless tobacco: Never Used  . Alcohol Use: No  . Drug Use: No  . Sexual Activity: No   Other Topics Concern  . None  Social History Narrative   Additional Social History:    Pain Medications: none                     Musculoskeletal: Strength & Muscle Tone: within normal limits Gait & Station: normal Patient leans: N/A  Psychiatric Specialty Exam: Physical Exam  Nursing note and vitals reviewed. Constitutional: She is oriented to person, place, and time. She appears  well-developed and well-nourished.  HENT:  Head: Normocephalic and atraumatic.  Eyes: Conjunctivae and EOM are normal. Pupils are equal, round, and reactive to light.  Neck: Normal range of motion. Neck supple.  Cardiovascular: Normal rate, regular rhythm and normal heart sounds.   Respiratory: Effort normal and breath sounds normal.  GI: Soft. Bowel sounds are normal.  Musculoskeletal: Normal range of motion.  Neurological: She is alert and oriented to person, place, and time. She has normal reflexes.  Skin: Skin is warm and dry.    Review of Systems  All other systems reviewed and are negative.   Blood pressure 132/83, pulse 99, temperature 100 F (37.8 C), temperature source Oral, resp. rate 20, height 5' 7.5" (1.715 m), weight 96.163 kg (212 lb), SpO2 100 %.Body mass index is 32.69 kg/(m^2).  See SRA.                                                  Sleep:      Risk to Self: Is patient at risk for suicide?: No Risk to Others:   Prior Inpatient Therapy:   Prior Outpatient Therapy:    Alcohol Screening: 1. How often do you have a drink containing alcohol?: Never 2. How many drinks containing alcohol do you have on a typical day when you are drinking?: 1 or 2 3. How often do you have six or more drinks on one occasion?: Never Preliminary Score: 0 4. How often during the last year have you found that you were not able to stop drinking once you had started?: Never 5. How often during the last year have you failed to do what was normally expected from you becasue of drinking?: Never 6. How often during the last year have you needed a first drink in the morning to get yourself going after a heavy drinking session?: Never 7. How often during the last year have you had a feeling of guilt of remorse after drinking?: Never 8. How often during the last year have you been unable to remember what happened the night before because you had been drinking?: Never 9. Have  you or someone else been injured as a result of your drinking?: No 10. Has a relative or friend or a doctor or another health worker been concerned about your drinking or suggested you cut down?: No Alcohol Use Disorder Identification Test Final Score (AUDIT): 0 Brief Intervention: AUDIT score less than 7 or less-screening does not suggest unhealthy drinking-brief intervention not indicated  Allergies:   Allergies  Allergen Reactions  . Betadine [Povidone Iodine] Other (See Comments)    Reaction:  Unknown   . Shellfish-Derived Products Other (See Comments)    Reaction:  Unknown   . Iodine Rash   Lab Results:  Results for orders placed or performed during the hospital encounter of 12/03/14 (from the past 48 hour(s))  Urine Drug Screen, Qualitative ([place] only)     Status:  None   Collection Time: 12/03/14  4:13 PM  Result Value Ref Range   Tricyclic, Ur Screen NONE DETECTED NONE DETECTED   Amphetamines, Ur Screen NONE DETECTED NONE DETECTED   MDMA (Ecstasy)Ur Screen NONE DETECTED NONE DETECTED   Cocaine Metabolite,Ur [place] NONE DETECTED NONE DETECTED   Opiate, Ur Screen NONE DETECTED NONE DETECTED   Phencyclidine (PCP) Ur S NONE DETECTED NONE DETECTED   Cannabinoid 50 Ng, Ur [place] NONE DETECTED NONE DETECTED   Barbiturates, Ur Screen NONE DETECTED NONE DETECTED   Benzodiazepine, Ur Scrn NONE DETECTED NONE DETECTED   Methadone Scn, Ur NONE DETECTED NONE DETECTED    Comment: (NOTE) 878  Tricyclics, urine               Cutoff 1000 ng/mL 200  Amphetamines, urine             Cutoff 1000 ng/mL 300  MDMA (Ecstasy), urine           Cutoff 500 ng/mL 400  Cocaine Metabolite, urine       Cutoff 300 ng/mL 500  Opiate, urine                   Cutoff 300 ng/mL 600  Phencyclidine (PCP), urine      Cutoff 25 ng/mL 700  Cannabinoid, urine              Cutoff 50 ng/mL 800  Barbiturates, urine             Cutoff 200 ng/mL 900  Benzodiazepine, urine           Cutoff 200 ng/mL 1000 Methadone, urine                 Cutoff 300 ng/mL 1100 1200 The urine drug screen provides only a preliminary, unconfirmed 1300 analytical test result and should not be used for non-medical 1400 purposes. Clinical consideration and professional judgment should 1500 be applied to any positive drug screen result due to possible 1600 interfering substances. A more specific alternate chemical method 1700 must be used in order to obtain a confirmed analytical result.  1800 Gas chromato graphy / mass spectrometry (GC/MS) is the preferred 1900 confirmatory method.   Comprehensive metabolic panel     Status: Abnormal   Collection Time: 12/03/14  4:14 PM  Result Value Ref Range   Sodium 138 135 - 145 mmol/L   Potassium 3.7 3.5 - 5.1 mmol/L   Chloride 106 101 - 111 mmol/L   CO2 25 22 - 32 mmol/L   Glucose, Bld 84 65 - 99 mg/dL   BUN 10 6 - 20 mg/dL   Creatinine, Ser 0.71 0.44 - 1.00 mg/dL   Calcium 9.6 8.9 - 10.3 mg/dL   Total Protein 7.4 6.5 - 8.1 g/dL   Albumin 4.3 3.5 - 5.0 g/dL   AST 15 15 - 41 U/L   ALT 8 (L) 14 - 54 U/L   Alkaline Phosphatase 75 38 - 126 U/L   Total Bilirubin 0.3 0.3 - 1.2 mg/dL   GFR calc non Af Amer >60 >60 mL/min   GFR calc Af Amer >60 >60 mL/min    Comment: (NOTE) The eGFR has been calculated using the CKD EPI equation. This calculation has not been validated in all clinical situations. eGFR's persistently <60 mL/min signify possible Chronic Kidney Disease.    Anion gap 7 5 - 15  Ethanol (ETOH)     Status: None   Collection Time: 12/03/14  4:14 PM  Result Value Ref Range   Alcohol, Ethyl (B) <5 <5 mg/dL    Comment:        LOWEST DETECTABLE LIMIT FOR SERUM ALCOHOL IS 5 mg/dL FOR MEDICAL PURPOSES ONLY   Salicylate level     Status: None   Collection Time: 12/03/14  4:14 PM  Result Value Ref Range   Salicylate Lvl <8.2 2.8 - 30.0 mg/dL  Acetaminophen level     Status: Abnormal   Collection Time: 12/03/14  4:14 PM  Result Value Ref Range   Acetaminophen (Tylenol), Serum <10  (L) 10 - 30 ug/mL    Comment:        THERAPEUTIC CONCENTRATIONS VARY SIGNIFICANTLY. A RANGE OF 10-30 ug/mL MAY BE AN EFFECTIVE CONCENTRATION FOR MANY PATIENTS. HOWEVER, Bell ARE BEST TREATED AT CONCENTRATIONS OUTSIDE THIS RANGE. ACETAMINOPHEN CONCENTRATIONS >150 ug/mL AT 4 HOURS AFTER INGESTION AND >50 ug/mL AT 12 HOURS AFTER INGESTION ARE OFTEN ASSOCIATED WITH TOXIC REACTIONS.   CBC     Status: None   Collection Time: 12/03/14  4:14 PM  Result Value Ref Range   WBC 9.0 3.6 - 11.0 K/uL   RBC 4.21 3.80 - 5.20 MIL/uL   Hemoglobin 12.7 12.0 - 16.0 g/dL   HCT 38.1 35.0 - 47.0 %   MCV 90.6 80.0 - 100.0 fL   MCH 30.1 26.0 - 34.0 pg   MCHC 33.2 32.0 - 36.0 g/dL   RDW 12.8 11.5 - 14.5 %   Platelets 187 150 - 440 K/uL  Pregnancy, urine POC     Status: None   Collection Time: 12/03/14  4:17 PM  Result Value Ref Range   Preg Test, Ur NEGATIVE NEGATIVE    Comment:        THE SENSITIVITY OF THIS METHODOLOGY IS >24 mIU/mL    Current Medications: Current Facility-Administered Medications  Medication Dose Route Frequency Provider Last Rate Last Dose  . acetaminophen (TYLENOL) tablet 650 mg  650 mg Oral Q6H PRN [name], MD      . alum & mag hydroxide-simeth (MAALOX/MYLANTA) 200-200-20 MG/5ML suspension 30 mL  30 mL Oral Q4H PRN [name], MD      . Derrill Memo ON 12/06/2014] amLODipine (NORVASC) tablet 2.5 mg  2.5 mg Oral Daily [name], MD      . benztropine (COGENTIN) tablet 1 mg  1 mg Oral QHS [name] [name], MD      . docusate sodium (COLACE) capsule 200 mg  200 mg Oral BID [name], MD      . fluPHENAZine (PROLIXIN) tablet 10 mg  10 mg Oral QHS [name], MD      . fluPHENAZine (PROLIXIN) tablet 5 mg  5 mg Oral QPC supper [name], MD      . Derrill Memo ON 12/06/2014] levothyroxine (SYNTHROID, LEVOTHROID) tablet 75 mcg  75 mcg Oral QAC breakfast [name], MD      . lurasidone (LATUDA) tablet 80 mg  80 mg Oral QAC supper [name], MD      .  magnesium hydroxide (MILK OF MAGNESIA) suspension 30 mL  30 mL Oral Daily PRN [name], MD      . OXcarbazepine (TRILEPTAL) tablet 150 mg  150 mg Oral BID [name], MD      . traZODone (DESYREL) tablet 50 mg  50 mg Oral QHS [name], MD      . Derrill Memo ON 12/06/2014] venlafaxine XR (EFFEXOR-XR) 24 hr capsule 150 mg  150 mg Oral Q breakfast [name], MD       PTA Medications: Prescriptions prior to admission  Medication Sig Dispense Refill Last Dose  . amLODipine (NORVASC) 2.5 MG tablet Take 1 tablet (2.5 mg total) by mouth daily. 30 tablet 0 12/04/2014  . benztropine (COGENTIN) 1 MG tablet Take 1 tablet (1 mg total) by mouth at bedtime. 30 tablet 0 11/26/2014 at Unknown time  . docusate sodium (COLACE) 100 MG capsule Take 2 capsules (200 mg total) by mouth 2 (two) times daily. 120 capsule 0 11/27/2014 at Unknown time  . fluPHENAZine (PROLIXIN) 10 MG tablet Take 1 tablet (10 mg total) by mouth at bedtime. 30 tablet 0 11/26/2014 at Unknown time  . fluPHENAZine (PROLIXIN) 5 MG tablet Take 1 tablet (5 mg total) by mouth daily. At 4 pm 60 tablet 0 11/27/2014 at Unknown time  . levothyroxine (SYNTHROID, LEVOTHROID) 75 MCG tablet Take 1 tablet (75 mcg total) by mouth daily before breakfast. 30 tablet 0 11/27/2014 at Unknown time  . lurasidone (LATUDA) 80 MG TABS tablet Take 1 tablet (80 mg total) by mouth daily with supper. 30 tablet 0 11/26/2014 at Unknown time  . OXcarbazepine (TRILEPTAL) 150 MG tablet Take 1 tablet (150 mg total) by mouth 2 (two) times daily. 60 tablet 0 11/27/2014 at Unknown time  . senna (SENOKOT) 8.6 MG TABS tablet Take 2 tablets (17.2 mg total) by mouth at bedtime. 60 each 0 11/26/2014 at Unknown time  . traZODone (DESYREL) 50 MG tablet Take 1 tablet (50 mg total) by mouth at bedtime as needed for sleep. 30 tablet 0 11/26/2014 at Unknown time  . venlafaxine XR (EFFEXOR-XR) 150 MG 24 hr capsule Take 1 capsule (150 mg total) by mouth daily with breakfast. 30 capsule 0  11/27/2014 at Unknown time    Previous Psychotropic Medications: Yes   Substance Abuse History in the last 12 months:  No.    Consequences of Substance Abuse: NA  Results for orders placed or performed during the hospital encounter of 12/03/14 (from the past 72 hour(s))  Urine Drug Screen, Qualitative ([facility] only)     Status: None   Collection Time: 12/03/14  4:13 PM  Result Value Ref Range   Tricyclic, Ur Screen NONE DETECTED NONE DETECTED   Amphetamines, Ur Screen NONE DETECTED NONE DETECTED   MDMA (Ecstasy)Ur Screen NONE DETECTED NONE DETECTED   Cocaine Metabolite,Ur [place] NONE DETECTED NONE DETECTED   Opiate, Ur Screen NONE DETECTED NONE DETECTED   Phencyclidine (PCP) Ur S NONE DETECTED NONE DETECTED   Cannabinoid 50 Ng, Ur [place] NONE DETECTED NONE DETECTED   Barbiturates, Ur Screen NONE DETECTED NONE DETECTED   Benzodiazepine, Ur Scrn NONE DETECTED NONE DETECTED   Methadone Scn, Ur NONE DETECTED NONE DETECTED    Comment: (NOTE) 500  Tricyclics, urine               Cutoff 1000 ng/mL 200  Amphetamines, urine             Cutoff 1000 ng/mL 300  MDMA (Ecstasy), urine           Cutoff 500 ng/mL 400  Cocaine Metabolite, urine       Cutoff 300 ng/mL 500  Opiate, urine                   Cutoff 300 ng/mL 600  Phencyclidine (PCP), urine      Cutoff 25 ng/mL 700  Cannabinoid, urine  Cutoff 50 ng/mL 800  Barbiturates, urine             Cutoff 200 ng/mL 900  Benzodiazepine, urine           Cutoff 200 ng/mL 1000 Methadone, urine                Cutoff 300 ng/mL 1100 1200 The urine drug screen provides only a preliminary, unconfirmed 1300 analytical test result and should not be used for non-medical 1400 purposes. Clinical consideration and professional judgment should 1500 be applied to any positive drug screen result due to possible 1600 interfering substances. A more specific alternate chemical method 1700 must be used in order to obtain a confirmed analytical result.  1800 Gas  chromato graphy / mass spectrometry (GC/MS) is the preferred 1900 confirmatory method.   Comprehensive metabolic panel     Status: Abnormal   Collection Time: 12/03/14  4:14 PM  Result Value Ref Range   Sodium 138 135 - 145 mmol/L   Potassium 3.7 3.5 - 5.1 mmol/L   Chloride 106 101 - 111 mmol/L   CO2 25 22 - 32 mmol/L   Glucose, Bld 84 65 - 99 mg/dL   BUN 10 6 - 20 mg/dL   Creatinine, Ser 0.71 0.44 - 1.00 mg/dL   Calcium 9.6 8.9 - 10.3 mg/dL   Total Protein 7.4 6.5 - 8.1 g/dL   Albumin 4.3 3.5 - 5.0 g/dL   AST 15 15 - 41 U/L   ALT 8 (L) 14 - 54 U/L   Alkaline Phosphatase 75 38 - 126 U/L   Total Bilirubin 0.3 0.3 - 1.2 mg/dL   GFR calc non Af Amer >60 >60 mL/min   GFR calc Af Amer >60 >60 mL/min    Comment: (NOTE) The eGFR has been calculated using the CKD EPI equation. This calculation has not been validated in all clinical situations. eGFR's persistently <60 mL/min signify possible Chronic Kidney Disease.    Anion gap 7 5 - 15  Ethanol (ETOH)     Status: None   Collection Time: 12/03/14  4:14 PM  Result Value Ref Range   Alcohol, Ethyl (B) <5 <5 mg/dL    Comment:        LOWEST DETECTABLE LIMIT FOR SERUM ALCOHOL IS 5 mg/dL FOR MEDICAL PURPOSES ONLY   Salicylate level     Status: None   Collection Time: 12/03/14  4:14 PM  Result Value Ref Range   Salicylate Lvl <7.3 2.8 - 30.0 mg/dL  Acetaminophen level     Status: Abnormal   Collection Time: 12/03/14  4:14 PM  Result Value Ref Range   Acetaminophen (Tylenol), Serum <10 (L) 10 - 30 ug/mL    Comment:        THERAPEUTIC CONCENTRATIONS VARY SIGNIFICANTLY. A RANGE OF 10-30 ug/mL MAY BE AN EFFECTIVE CONCENTRATION FOR MANY PATIENTS. HOWEVER, Bell ARE BEST TREATED AT CONCENTRATIONS OUTSIDE THIS RANGE. ACETAMINOPHEN CONCENTRATIONS >150 ug/mL AT 4 HOURS AFTER INGESTION AND >50 ug/mL AT 12 HOURS AFTER INGESTION ARE OFTEN ASSOCIATED WITH TOXIC REACTIONS.   CBC     Status: None   Collection Time: 12/03/14  4:14 PM   Result Value Ref Range   WBC 9.0 3.6 - 11.0 K/uL   RBC 4.21 3.80 - 5.20 MIL/uL   Hemoglobin 12.7 12.0 - 16.0 g/dL   HCT 38.1 35.0 - 47.0 %   MCV 90.6 80.0 - 100.0 fL   MCH 30.1 26.0 - 34.0 pg   MCHC 33.2 32.0 -  36.0 g/dL   RDW 12.8 11.5 - 14.5 %   Platelets 187 150 - 440 K/uL  Pregnancy, urine POC     Status: None   Collection Time: 12/03/14  4:17 PM  Result Value Ref Range   Preg Test, Ur NEGATIVE NEGATIVE    Comment:        THE SENSITIVITY OF THIS METHODOLOGY IS >24 mIU/mL     Observation Level/Precautions:  15 minute checks  Laboratory:  CBC Chemistry Profile UDS UA  Psychotherapy:    Medications:    Consultations:    Discharge Concerns:    Estimated LOS:  Other:     Psychological Evaluations: No   Treatment Plan Summary: Daily contact with patient to assess and evaluate symptoms and progress in treatment and Medication management  Medical Decision Making:  New problem, with additional work up planned, Review of Psycho-Social Stressors (1), Review or order clinical lab tests (1), Review of Medication Regimen & Side Effects (2) and Review of New Medication or Change in Dosage (2)   Melanie Bell is a 24 year old female well known to [name] with a history of schizoaffective disorder bipolar type and a habit of swallowing foreign objects who came to the hospital complaining of auditory command hallucinations telling her that she were worthless and commanding her to kill herself. She did not attempt to harm herself at this time around and came to the hospital for help.  1. Suicidal ideation. The patient is able to contract for safety in the hospital.  2. Mood and psychosis. We will continue all her medications including Latuda, Prolixin, for psychosis, Trileptal for mood stabilization, and Effexor for depression. Will increase latuda from 80 to 120 and Effexor from 150 to 225 mg.  3. Insomnia. We will switch trazodone to restoril 15 mg..  4. Hypothyroidism. We'll continue  Synthroid.  5. Hypertension. We'll continue Norvasc.  6. Constipation. We'll continue Colace.  7. Disposition. She will be discharged to her group home. Follow up with her regular provider.    I certify that inpatient services furnished can reasonably be expected to improve the patient's condition.   Holmes Hays 7/22/20163:47 PM

## 2014-12-05 NOTE — ED Notes (Signed)
BEHAVIORAL HEALTH ROUNDING Patient sleeping: No. Patient alert and oriented: yes Behavior appropriate: Yes.  ; If no, describe:  Nutrition and fluids offered: Yes  Toileting and hygiene offered: Yes  Sitter present: not applicable Law enforcement present: Yes  

## 2014-12-05 NOTE — Progress Notes (Signed)
Admitted from ED, a 24 yo female with a diagnosis of schizoaffective disorder. Patient came to the hospital voluntarily because of worsening depressed mood and fear of self harm behaviors. She denies SI. See admission assessment for complete clinical data. Patient was cooperative with all nursing assessments. A search was done and there was no contraband found. Verifying nurse was Silva Bandy, Therapist, sports.

## 2014-12-05 NOTE — ED Notes (Signed)
ED BHU [place] Is the patient under IVC or is there intent for IVC: Yes.   Is the patient medically cleared: Yes.   Is there vacancy in the ED BHU: Yes.   Is the population mix appropriate for patient: Yes.   Is the patient awaiting placement in inpatient or outpatient setting: Yes.   Has the patient had a psychiatric consult: Yes.   Survey of unit performed for contraband, proper placement and condition of furniture, tampering with fixtures in bathroom, shower, and each patient room: Yes.  ; Findings:  APPEARANCE/BEHAVIOR calm NEURO ASSESSMENT Orientation: time, place and person Hallucinations: No.None noted (Hallucinations) Speech: Normal Gait: normal RESPIRATORY ASSESSMENT Normal expansion.  Clear to auscultation.  No rales, rhonchi, or wheezing. CARDIOVASCULAR ASSESSMENT regular rate and rhythm, S1, S2 normal, no murmur, click, rub or gallop GASTROINTESTINAL ASSESSMENT soft, nontender, BS WNL, no r/g EXTREMITIES normal strength, tone, and muscle mass PLAN OF CARE Provide calm/safe environment. Vital signs assessed twice daily. ED BHU Assessment once each 12-hour shift. Collaborate with intake RN daily or as condition indicates. Assure the ED provider has rounded once each shift. Provide and encourage hygiene. Provide redirection as needed. Assess for escalating behavior; address immediately and inform ED provider.  Assess family dynamic and appropriateness for visitation as needed: Yes.  ; If necessary, describe findings:  Educate the patient/family about BHU procedures/visitation: Yes.  ; If necessary, describe findings:

## 2014-12-06 MED ORDER — VENLAFAXINE HCL ER 75 MG PO CP24
300.0000 mg | ORAL_CAPSULE | Freq: Every day | ORAL | Status: DC
  Administered 2014-12-07 – 2014-12-09 (×3): 300 mg via ORAL
  Filled 2014-12-06 (×3): qty 4

## 2014-12-06 NOTE — Progress Notes (Signed)
Pt is pleasant and cooperative, med compliant, and notes no needs. She has limited interaction in the milieu. Notes being unable to practice healthy coping skills outside of the hospital, but notes feeling safer and better when admitted. No new needs or distress noted.

## 2014-12-06 NOTE — [facility] Group Notes (Signed)
[place] Group Notes:  (Nursing/MHT/Case Management/Adjunct)  Date:  12/06/2014  Time:  3:09 AM  Type of Therapy:  Group Therapy  Participation Level:  Did Not Attend    Summary of Progress/Problems:  Melanie Bell 12/06/2014, 3:09 AM
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
## 2014-12-06 NOTE — Progress Notes (Signed)
Pt has been pleasant and cooperative although some what seclusive to her room. Pt has attended some unit activities. Pt's mood and affect has been depressed. Pt denies SI and A/V hallucinations and is able to contract for  Safety.

## 2014-12-06 NOTE — [facility] Group Notes (Signed)
[facility] LCSW Group Therapy  12/06/2014 2:16 PM  Type of Therapy:  Group Therapy  Participation Level:  Active  Participation Quality:  Attentive  Affect:  Flat  Cognitive:  Alert  Insight:  Improving  Engagement in Therapy:  Improving  Modes of Intervention:  Discussion, Education, Socialization and Support  Summary of Progress/Problems: Communications: Patients identify how individuals communicate with one another appropriately and inappropriately. Patients will be guided to discuss their thoughts, feelings, and behaviors related to barriers when communicating. The group will process together ways to execute positive and appropriate communications Shantara discussed relationships at her group home.   [place] MSW, [place]  12/06/2014, 2:16 PM

## 2014-12-06 NOTE — Progress Notes (Signed)
[facility] MD Progress Note  12/06/2014 3:00 PM Melanie Bell  MRN:  [id number] Subjective:  Follow-up for this 24 year old woman with schizoaffective disorder came into the hospital reporting suicidal ideation Principal Problem: Schizoaffective disorder, bipolar type Diagnosis:   Patient Active Problem List   Diagnosis Date Noted  . 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 Schizoaffective disorder, bipolar type [F25.0] 11/06/2014  . Tardive dyskinesia [G24.01] 10/06/2014  . Tobacco use disorder [Z72.0] 09/30/2014  . H/O foreign body ingestion [Z87.821] 09/29/2014  . Hypothyroid [E03.9] 09/29/2014  . Hypertension [I10] 09/29/2014  . Constipation [K59.00] 09/29/2014   Total Time spent with patient: 30 minutes   Past Medical History:  Past Medical History  Diagnosis Date  . Hallucinations 09/30/2014    Sizoaffective  . Depression   . Anxiety   . Hypertension   . Tardive dyskinesia 10/2014    recent onset  . GERD (gastroesophageal reflux disease)     Past Surgical History  Procedure Laterality Date  . Breast lumpectomy    . Wisdom tooth extraction    . Abdominal surgery      "years ago" to remove foreign objects   Family History:  Family History  Problem Relation Age of Onset  . Depression Mother   . Hypertension Mother    Social History:  History  Alcohol Use No     History  Drug Use No    History   Social History  . Marital Status: Single    Spouse Name: N/A  . Number of Children: N/A  . Years of Education: N/A   Social History Main Topics  . Smoking status: Current Every Day Smoker -- 0.50 packs/day for 3 years    Types: Cigarettes  . Smokeless tobacco: Never Used  . Alcohol Use: No  . Drug Use: No  . Sexual Activity: No   Other Topics Concern  . None   Social History Narrative   Additional History:   patient states that her mood today is still depressed. Still has some suicidal ideation. Feels fatigued and down. Didn't sleep well last  night. Does not think she is likely to try and kill her self in the hospital but has a certain hopelessness towards the future. She is taking care of her basic hygiene but has not participated very much. Tolerating medicine well.  Sleep: Fair  Appetite:  Fair   Assessment: Stabilizing. Still depressed. Able to engage in therapy. Tolerating medicine well. Continues to require inpatient stabilization because of risk of suicidal behavior  Musculoskeletal: Strength & Muscle Tone: within normal limits Gait & Station: normal Patient leans: N/A   Psychiatric Specialty Exam: Physical Exam  Constitutional: She appears well-developed and well-nourished.  HENT:  Head: Normocephalic and atraumatic.  Eyes: Conjunctivae are normal. Pupils are equal, round, and reactive to light.  Neck: Normal range of motion.  Cardiovascular: Normal heart sounds.   Respiratory: Effort normal.  GI: Soft.  Musculoskeletal: Normal range of motion.  Neurological: She is alert.  Skin: Skin is warm and dry.  Psychiatric: Her mood appears anxious. Her affect is blunt. Her speech is delayed. She is withdrawn. Cognition and memory are impaired. She expresses impulsivity. She exhibits a depressed mood. She expresses suicidal ideation.    Review of Systems  Constitutional: Negative.   HENT: Negative.   Eyes: Negative.   Respiratory: Negative.   Cardiovascular: Negative.   Gastrointestinal: Negative.   Musculoskeletal: Negative.   Skin: Negative.   Neurological: Negative.   Psychiatric/Behavioral: Positive for depression  and suicidal ideas. Negative for hallucinations and substance abuse. The patient is nervous/anxious.     Blood pressure 110/68, pulse 111, temperature 100.7 F (38.2 C), temperature source Oral, resp. rate 20, height 5' 7.5" (1.715 m), weight 96.163 kg (212 lb), SpO2 100 %.Body mass index is 32.69 kg/(m^2).  General Appearance: Casual  Eye Contact::  Fair  Speech:  Slow  Volume:  Decreased   Mood:  Depressed  Affect:  Depressed  Thought Process:  Tangential  Orientation:  Full (Time, Place, and Person)  Thought Content:  Hallucinations: Auditory  Suicidal Thoughts:  Yes.  without intent/plan  Homicidal Thoughts:  No  Memory:  Immediate;   Good Recent;   Fair Remote;   Fair  Judgement:  Impaired  Insight:  Fair  Psychomotor Activity:  Decreased  Concentration:  Fair  Recall:  [employer] of Knowledge:Fair  Language: Fair  Akathisia:  no  Handed:  Right  AIMS (if indicated):     Assets:  Communication Skills Housing Resilience  ADL's:  Intact  Cognition: Impaired,  Mild  Sleep:  Number of Hours: 7.75     Current Medications: Current Facility-Administered Medications  Medication Dose Route Frequency Provider Last Rate Last Dose  . acetaminophen (TYLENOL) tablet 650 mg  650 mg Oral Q6H PRN [name], MD   650 mg at 12/06/14 1016  . alum & mag hydroxide-simeth (MAALOX/MYLANTA) 200-200-20 MG/5ML suspension 30 mL  30 mL Oral Q4H PRN [name], MD      . amLODipine (NORVASC) tablet 2.5 mg  2.5 mg Oral Daily [name], MD   2.5 mg at 12/06/14 1016  . benztropine (COGENTIN) tablet 1 mg  1 mg Oral QHS [name], MD   1 mg at 12/05/14 2217  . docusate sodium (COLACE) capsule 200 mg  200 mg Oral BID [name], MD   200 mg at 12/06/14 1015  . fluPHENAZine (PROLIXIN) tablet 10 mg  10 mg Oral QHS [name], MD   10 mg at 12/05/14 2217  . fluPHENAZine (PROLIXIN) tablet 5 mg  5 mg Oral QPC supper [name], MD   5 mg at 12/05/14 1720  . levothyroxine (SYNTHROID, LEVOTHROID) tablet 75 mcg  75 mcg Oral QAC breakfast [name], MD   75 mcg at 12/06/14 1015  . lurasidone (LATUDA) tablet 120 mg  120 mg Oral QAC supper [name], MD   120 mg at 12/05/14 1622  . magnesium hydroxide (MILK OF MAGNESIA) suspension 30 mL  30 mL Oral Daily PRN [name], MD      . OXcarbazepine (TRILEPTAL) tablet 150 mg  150 mg Oral BID [name],  MD   150 mg at 12/06/14 1015  . temazepam (RESTORIL) capsule 15 mg  15 mg Oral QHS PRN [name] [name], MD      . venlafaxine XR (EFFEXOR-XR) 24 hr capsule 225 mg  225 mg Oral Q breakfast [name] [name], MD   225 mg at 12/06/14 1017    Lab Results: No results found for this or any previous visit (from the past 48 hour(s)).  Physical Findings: AIMS:  , ,  ,  , Dental Status Current problems with teeth and/or dentures?: No  CIWA:    COWS:     Treatment Plan Summary: Medication management and Plan Psychiatrically patient is still down and depressed. Hallucinations on and off. Passive suicidal thoughts. I will increase the dose of her Effexor to  300 mg a day. Continue current Prolixin. Encourage patient to get out of bed and be interactive on the unit. Continue to follow-up with inpatient hospitalization.   Medical Decision Making:  Established Problem, Worsening (2), Review or order medicine tests (1), Review of Medication Regimen & Side Effects (2) and Review of New Medication or Change in Dosage (2)     Elijahjames Fuelling 12/06/2014, 3:00 PM

## 2014-12-06 NOTE — Plan of Care (Signed)
Problem: Ineffective individual coping Goal: LTG: Patient will report a decrease in negative feelings Outcome: Progressing Patient notes she typically feels better in the hospital.  Goal: STG: Patient will remain free from self harm Outcome: Progressing No self harm.  Goal: STG-Increase in ability to manage activities of daily living Outcome: Progressing Patient able to independently complete ADL's.

## 2014-12-07 NOTE — BHH Suicide Risk Assessment (Signed)
[place] INPATIENT:  Family/Significant Other Suicide Prevention Education  Suicide Prevention Education:  Education Completed; [name] (mother) has been identified by the patient as the family member/significant other with whom the patient will be residing, and identified as the person(s) who will aid the patient in the event of a mental health crisis (suicidal ideations/suicide attempt).  With written consent from the patient, the family member/significant other has been provided the following suicide prevention education, prior to the and/or following the discharge of the patient.  The suicide prevention education provided includes the following:  Suicide risk factors  Suicide prevention and interventions  National Suicide Hotline telephone number  [facility] assessment telephone number  [facility] Emergency Assistance [place] and/or Residential Mobile Crisis Unit telephone number  Request made of family/significant other to:  Remove weapons (e.g., guns, rifles, knives), all items previously/currently identified as safety concern.    Remove drugs/medications (over-the-counter, prescriptions, illicit drugs), all items previously/currently identified as a safety concern.  The family member/significant other verbalizes understanding of the suicide prevention education information provided.  The family member/significant other agrees to remove the items of safety concern listed above.   [place] MSW, [place]  12/07/2014, 10:56 AM

## 2014-12-07 NOTE — Progress Notes (Signed)
No new needs or distress noted, other than a headache prior to bedtime. Tylenol was given which was found to be effective. The patient states her relief in having staff to talk to and enjoys being able to process her stressors in a healthy way. She denies current SI,HI, and AVH. She continues to endorse some sadness and fear of not having appropriate coping skills. She is pleasant when addressed, but not interacting in the milieu, or readily with her peers.

## 2014-12-07 NOTE — Progress Notes (Signed)
[facility] MD Progress Note  12/07/2014 11:21 PM Melanie Bell  MRN:  [id number] Subjective:  Follow-up for this 24 year old woman with schizoaffective disorder came into the hospital reporting suicidal ideation Principal Problem: Schizoaffective disorder, bipolar type Diagnosis:   Patient Active Problem List   Diagnosis Date Noted  . 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 Schizoaffective disorder, bipolar type [F25.0] 11/06/2014  . Tardive dyskinesia [G24.01] 10/06/2014  . Tobacco use disorder [Z72.0] 09/30/2014  . H/O foreign body ingestion [Z87.821] 09/29/2014  . Hypothyroid [E03.9] 09/29/2014  . Hypertension [I10] 09/29/2014  . Constipation [K59.00] 09/29/2014   Total Time spent with patient: 30 minutes   Past Medical History:  Past Medical History  Diagnosis Date  . Hallucinations 09/30/2014    Sizoaffective  . Depression   . Anxiety   . Hypertension   . Tardive dyskinesia 10/2014    recent onset  . GERD (gastroesophageal reflux disease)     Past Surgical History  Procedure Laterality Date  . Breast lumpectomy    . Wisdom tooth extraction    . Abdominal surgery      "years ago" to remove foreign objects   Family History:  Family History  Problem Relation Age of Onset  . Depression Mother   . Hypertension Mother    Social History:  History  Alcohol Use No     History  Drug Use No    History   Social History  . Marital Status: Single    Spouse Name: N/A  . Number of Children: N/A  . Years of Education: N/A   Social History Main Topics  . Smoking status: Current Every Day Smoker -- 0.50 packs/day for 3 years    Types: Cigarettes  . Smokeless tobacco: Never Used  . Alcohol Use: No  . Drug Use: No  . Sexual Activity: No   Other Topics Concern  . None   Social History Narrative   Additional History:   continues to be withdrawn and to feel very depressed. Affect flat. Tolerating medication. Possibly having a minor headache but no other  sign of side effects. Sleep: Fair  Appetite:  Fair   Assessment: Stabilizing. Still depressed. Able to engage in therapy. Tolerating medicine well. Continues to require inpatient stabilization because of risk of suicidal behavior  Musculoskeletal: Strength & Muscle Tone: within normal limits Gait & Station: normal Patient leans: N/A   Psychiatric Specialty Exam: Physical Exam  Constitutional: She appears well-developed and well-nourished.  HENT:  Head: Normocephalic and atraumatic.  Eyes: Conjunctivae are normal. Pupils are equal, round, and reactive to light.  Neck: Normal range of motion.  Cardiovascular: Normal heart sounds.   Respiratory: Effort normal.  GI: Soft.  Musculoskeletal: Normal range of motion.  Neurological: She is alert.  Skin: Skin is warm and dry.  Psychiatric: Her mood appears anxious. Her affect is blunt. Her speech is delayed. She is withdrawn. Cognition and memory are impaired. She expresses impulsivity. She exhibits a depressed mood. She expresses suicidal ideation.    Review of Systems  Constitutional: Negative.   HENT: Negative.   Eyes: Negative.   Respiratory: Negative.   Cardiovascular: Negative.   Gastrointestinal: Negative.   Musculoskeletal: Negative.   Skin: Negative.   Neurological: Negative.   Psychiatric/Behavioral: Positive for depression and suicidal ideas. Negative for hallucinations and substance abuse. The patient is nervous/anxious.     Blood pressure 98/68, pulse 130, temperature 99.8 F (37.7 C), temperature source Oral, resp. rate 20, height 5' 7.5" (1.715 m), weight  96.163 kg (212 lb), SpO2 100 %.Body mass index is 32.69 kg/(m^2).  General Appearance: Casual  Eye Contact::  Fair  Speech:  Slow  Volume:  Decreased  Mood:  Depressed  Affect:  Depressed  Thought Process:  Tangential  Orientation:  Full (Time, Place, and Person)  Thought Content:  Hallucinations: Auditory  Suicidal Thoughts:  Yes.  without intent/plan   Homicidal Thoughts:  No  Memory:  Immediate;   Good Recent;   Fair Remote;   Fair  Judgement:  Impaired  Insight:  Fair  Psychomotor Activity:  Decreased  Concentration:  Fair  Recall:  [employer] of Knowledge:Fair  Language: Fair  Akathisia:  no  Handed:  Right  AIMS (if indicated):     Assets:  Communication Skills Housing Resilience  ADL's:  Intact  Cognition: Impaired,  Mild  Sleep:  Number of Hours: 7.75     Current Medications: Current Facility-Administered Medications  Medication Dose Route Frequency Provider Last Rate Last Dose  . acetaminophen (TYLENOL) tablet 650 mg  650 mg Oral Q6H PRN [name], MD   650 mg at 12/07/14 1605  . alum & mag hydroxide-simeth (MAALOX/MYLANTA) 200-200-20 MG/5ML suspension 30 mL  30 mL Oral Q4H PRN [name], MD      . amLODipine (NORVASC) tablet 2.5 mg  2.5 mg Oral Daily [name], MD   2.5 mg at 12/07/14 0935  . benztropine (COGENTIN) tablet 1 mg  1 mg Oral QHS [name], MD   1 mg at 12/07/14 2151  . docusate sodium (COLACE) capsule 200 mg  200 mg Oral BID [name], MD   200 mg at 12/07/14 2151  . fluPHENAZine (PROLIXIN) tablet 10 mg  10 mg Oral QHS [name], MD   10 mg at 12/07/14 2151  . fluPHENAZine (PROLIXIN) tablet 5 mg  5 mg Oral QPC supper [name], MD   5 mg at 12/07/14 1722  . levothyroxine (SYNTHROID, LEVOTHROID) tablet 75 mcg  75 mcg Oral QAC breakfast [name], MD   75 mcg at 12/07/14 0936  . lurasidone (LATUDA) tablet 120 mg  120 mg Oral QAC supper [name], MD   120 mg at 12/07/14 1721  . magnesium hydroxide (MILK OF MAGNESIA) suspension 30 mL  30 mL Oral Daily PRN [name], MD      . OXcarbazepine (TRILEPTAL) tablet 150 mg  150 mg Oral BID [name], MD   150 mg at 12/07/14 2151  . temazepam (RESTORIL) capsule 15 mg  15 mg Oral QHS PRN [name] [name], MD      . venlafaxine XR (EFFEXOR-XR) 24 hr capsule 300 mg  300 mg Oral Q breakfast [name],  MD   300 mg at 12/07/14 0941    Lab Results: No results found for this or any previous visit (from the past 48 hour(s)).  Physical Findings: AIMS:  , ,  ,  , Dental Status Current problems with teeth and/or dentures?: No  CIWA:    COWS:     Treatment Plan Summary: Medication management and Plan Psychiatrically patient is still down and depressed. Hallucinations on and off. Passive suicidal thoughts. I will increase the dose of her Effexor to 300 mg a day. Continue current Prolixin. Encourage patient to get out of bed and be interactive on the unit. Continue to follow-up with inpatient hospitalization.  No further change to medication. Continues to have passive suicidal thoughts but  has not acted on them.   Medical Decision Making:  Established Problem, Worsening (2), Review or order medicine tests (1), Review of Medication Regimen & Side Effects (2) and Review of New Medication or Change in Dosage (2)     Melanie Bell 12/07/2014, 11:21 PM

## 2014-12-07 NOTE — Plan of Care (Signed)
Problem: Ineffective individual coping Goal: LTG: Patient will report a decrease in negative feelings Outcome: Progressing Patient notes feeling more secure and able to think through decisions while on an inpatient unit.  Goal: STG: Patient will remain free from self harm Outcome: Progressing No self harm.

## 2014-12-07 NOTE — [facility] Group Notes (Signed)
[facility] LCSW Group Therapy  12/07/2014 2:33 PM  Type of Therapy:  Group Therapy  Participation Level:  Did Not Attend  Modes of Intervention:  Discussion, Education, Socialization and Support  Summary of Progress/Problems:Pts were asked to identify what balance means to them. They were encouraged to identify what throws them off balance and how to regain balance.   Britni Driscoll L Kolbee Bogusz MSW, LCSWA  12/07/2014, 2:33 PM  

## 2014-12-07 NOTE — Progress Notes (Signed)
Pt has been pleasant and cooperative although some what seclusive to her room. Pt has attended some unit activities. Pt's mood and affect has been depressed. Pt denies SI and A/V hallucinations and is able to contract for Safety. Pt c/o having a headache and was medicated with tylenol which was partially effective.

## 2014-12-08 ENCOUNTER — Encounter: Payer: Self-pay | Admitting: Unknown Physician Specialty

## 2014-12-08 LAB — URINALYSIS COMPLETE WITH MICROSCOPIC (ARMC ONLY)
Bilirubin Urine: NEGATIVE
Glucose, UA: NEGATIVE mg/dL
Ketones, ur: NEGATIVE mg/dL
NITRITE: NEGATIVE
PROTEIN: NEGATIVE mg/dL
SPECIFIC GRAVITY, URINE: 1.003 — AB (ref 1.005–1.030)
Squamous Epithelial / LPF: NONE SEEN
pH: 7 (ref 5.0–8.0)

## 2014-12-08 MED ORDER — FOSFOMYCIN TROMETHAMINE 3 G PO PACK
3.0000 g | PACK | Freq: Once | ORAL | Status: AC
  Administered 2014-12-08: 3 g via ORAL
  Filled 2014-12-08: qty 3

## 2014-12-08 NOTE — Progress Notes (Signed)
Patient is alert and oriented x 4. She reports her mood is still low and she has a blunted affect. Denies SI. No reports of swallowing objects at this time. No evidence of gross psychotic thinking. Speech is clear and coherent, thoughts are organized.  Patient able to discuss feelings with staff and respond to feedback. Patient with low grade fever of 101.5. She also has a headache. Tylenol given per MD order. She was encouraged to drink fluids and to stay in bed this morning. Will continue to monitor. Plan - continue current treatment plan. Monitor clinical status. Maintain on 15 minute checks for safety.

## 2014-12-08 NOTE — Progress Notes (Signed)
Recreation Therapy Notes  INPATIENT RECREATION THERAPY ASSESSMENT  Patient Details Name: ONEIDA MCKAMEY MRN: [id number] DOB: 19-Oct-1990 Today's Date: 14-Dec-2014  Patient Stressors: Death, Friends (Grandmother passed 2 years ago; lack of friends)  Coping Skills:   Isolate, Avoidance, Exercise, Art/Dance, Talking, Music, Other (Comment) (Journaling)  Personal Challenges: Anger, Communication, Concentration, Decision-Making, Expressing Yourself, Self-Esteem/Confidence, Social Interaction, Stress Management, Time Management, Trusting Others  Leisure Interests (2+):  Sports - Swimming, Therapist, music - Hospital doctor Resources:  Yes  Community Resources:  Park  Current Use: Yes  If no, Barriers?:    Patient Strengths:  Technical sales engineer, smart  Patient Identified Areas of Improvement:  HOe she handles her stress and learn warning signs before it gets too late  Current Recreation Participation:  Go to the [employer] - bingo and dancing  Patient Goal for Hospitalization:  Find out how to get rid of the [place] of Residence:  [place] of Residence:  [place]   Current SI (including self-harm):  No  Current HI:  No  Consent to Intern Participation: N/A   Leonette Monarch, LRT/CTRS 12/14/14, 2:01 PM

## 2014-12-08 NOTE — Progress Notes (Signed)
Recreation Therapy Notes  Date: 07.25.16 Time: 3:00 pm Location: Craft Room  Group Topic: Self-expression  Goal Area(s) Addresses:  Patient will effectively use art as a means of self-expression. Patient will recognize positive benefit of self-expression. Patient will be able to identify one emotion experienced during group session. Patient will identify use of art/self-expression as a coping skill.  Behavioral Response: Did not attend  Intervention: Two Faces of Me  Activity: Patients were given a blank face worksheet and instructed to draw a line down the middle. On one side they were instructed to draw or write how they felt when they were admitted to the hospital. On the other side they were instructed to draw or write how they want to feel when they are d/c from the hospital.  Education: LRT educated patients on different forms of self-expression.  Education Outcome: Patient did not attend group.  Clinical Observations/Feedback: Patient did not attend group.  Leonette Monarch, LRT/CTRS 12/08/2014 4:04 PM

## 2014-12-08 NOTE — BHH Group Notes (Signed)
[facility] LCSW Group Therapy  12/08/2014 3:35 PM  Type of Therapy:  Group Therapy  Participation Level:  Did Not Attend   Keene Breath, MSW, LCSWA 12/08/2014, 3:35 PM

## 2014-12-08 NOTE — Progress Notes (Signed)
[facility] MD Progress Note  12/08/2014 11:49 AM Melanie Bell  MRN:  [id number]  Subjective:  [name] feels much better today. Her mood is improving, affect brighter, hallucinations have resolved. She feels physically drained today as she started experiencing some symptoms of urinary tract infection with elevated temperature. We ordered a urinalysis and treat and UTI with possible myosin. She complained that she sleeps too much. Appetite is good. She tolerates medications well. There is good group participation.  Principal Problem: Schizoaffective disorder, bipolar type Diagnosis:   Patient Active Problem List   Diagnosis Date Noted  . 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 Schizoaffective disorder, bipolar type [F25.0] 11/06/2014  . Tardive dyskinesia [G24.01] 10/06/2014  . Tobacco use disorder [Z72.0] 09/30/2014  . H/O foreign body ingestion [Z87.821] 09/29/2014  . Hypothyroid [E03.9] 09/29/2014  . Hypertension [I10] 09/29/2014  . Constipation [K59.00] 09/29/2014   Total Time spent with patient: 20 minutes   Past Medical History:  Past Medical History  Diagnosis Date  . Hallucinations 09/30/2014    Sizoaffective  . Depression   . Anxiety   . Hypertension   . Tardive dyskinesia 10/2014    recent onset  . GERD (gastroesophageal reflux disease)     Past Surgical History  Procedure Laterality Date  . Breast lumpectomy    . Wisdom tooth extraction    . Abdominal surgery      "years ago" to remove foreign objects  . Esophagogastroduodenoscopy N/A 11/28/2014    Procedure: ESOPHAGOGASTRODUODENOSCOPY (EGD);  Surgeon: [name], MD;  Location: [facility] ENDOSCOPY;  Service: Endoscopy;  Laterality: N/A;   Family History:  Family History  Problem Relation Age of Onset  . Depression Mother   . Hypertension Mother    Social History:  History  Alcohol Use No     History  Drug Use No    History   Social History  . Marital Status: Single    Spouse Name:  N/A  . Number of Children: N/A  . Years of Education: N/A   Social History Main Topics  . Smoking status: Current Every Day Smoker -- 0.50 packs/day for 3 years    Types: Cigarettes  . Smokeless tobacco: Never Used  . Alcohol Use: No  . Drug Use: No  . Sexual Activity: No   Other Topics Concern  . None   Social History Narrative   Additional History:    Sleep: Good  Appetite:  Good   Assessment:   Musculoskeletal: Strength & Muscle Tone: within normal limits Gait & Station: normal Patient leans: N/A   Psychiatric Specialty Exam: Physical Exam  Nursing note and vitals reviewed.   Review of Systems  Genitourinary: Positive for frequency.  All other systems reviewed and are negative.   Blood pressure 98/68, pulse 130, temperature 101.1 F (38.4 C), temperature source Oral, resp. rate 20, height 5' 7.5" (1.715 m), weight 96.163 kg (212 lb), SpO2 100 %.Body mass index is 32.69 kg/(m^2).  General Appearance: Casual  Eye Contact::  Good  Speech:  Clear and Coherent  Volume:  Normal  Mood:  Euthymic  Affect:  Appropriate  Thought Process:  Goal Directed  Orientation:  Full (Time, Place, and Person)  Thought Content:  WDL  Suicidal Thoughts:  No  Homicidal Thoughts:  No  Memory:  Immediate;   Fair Recent;   Fair Remote;   Fair  Judgement:  Fair  Insight:  Fair  Psychomotor Activity:  Normal  Concentration:  Fair  Recall:  Fair  Fund of Knowledge:Fair  Language: Fair  Akathisia:  No  Handed:  Right  AIMS (if indicated):     Assets:  Communication Skills Desire for Improvement Financial Resources/Insurance Housing  ADL's:  Intact  Cognition: WNL  Sleep:  Number of Hours: 9     Current Medications: Current Facility-Administered Medications  Medication Dose Route Frequency Provider Last Rate Last Dose  . acetaminophen (TYLENOL) tablet 650 mg  650 mg Oral Q6H PRN [name], MD   650 mg at 12/08/14 0757  . alum & mag hydroxide-simeth  (MAALOX/MYLANTA) 200-200-20 MG/5ML suspension 30 mL  30 mL Oral Q4H PRN [name], MD      . amLODipine (NORVASC) tablet 2.5 mg  2.5 mg Oral Daily [name], MD   2.5 mg at 12/07/14 0935  . benztropine (COGENTIN) tablet 1 mg  1 mg Oral QHS [name], MD   1 mg at 12/07/14 2151  . docusate sodium (COLACE) capsule 200 mg  200 mg Oral BID [name], MD   200 mg at 12/08/14 1022  . fluPHENAZine (PROLIXIN) tablet 10 mg  10 mg Oral QHS [name], MD   10 mg at 12/07/14 2151  . fluPHENAZine (PROLIXIN) tablet 5 mg  5 mg Oral QPC supper [name], MD   5 mg at 12/07/14 1722  . fosfomycin (MONUROL) packet 3 g  3 g Oral Once Marki Frede B Weldon Nouri, MD      . levothyroxine (SYNTHROID, LEVOTHROID) tablet 75 mcg  75 mcg Oral QAC breakfast [name], MD   75 mcg at 12/08/14 0758  . lurasidone (LATUDA) tablet 120 mg  120 mg Oral QAC supper [name], MD   120 mg at 12/07/14 1721  . magnesium hydroxide (MILK OF MAGNESIA) suspension 30 mL  30 mL Oral Daily PRN [name], MD      . OXcarbazepine (TRILEPTAL) tablet 150 mg  150 mg Oral BID [name], MD   150 mg at 12/08/14 1024  . temazepam (RESTORIL) capsule 15 mg  15 mg Oral QHS PRN Lamark Schue B Kessie Croston, MD      . venlafaxine XR (EFFEXOR-XR) 24 hr capsule 300 mg  300 mg Oral Q breakfast [name], MD   300 mg at 12/08/14 7341    Lab Results: No results found for this or any previous visit (from the past 48 hour(s)).  Physical Findings: AIMS:  , ,  ,  , Dental Status Current problems with teeth and/or dentures?: No  CIWA:    COWS:     Treatment Plan Summary: Daily contact with patient to assess and evaluate symptoms and progress in treatment and Medication management   Medical Decision Making:  Established Problem, Stable/Improving (1), Review of Psycho-Social Stressors (1), Review or order clinical lab tests (1), Review of Medication Regimen & Side Effects (2) and Review of New Medication or Change  in Dosage (2)   [name] is a 24 year old female well known to [name] with a history of schizoaffective disorder bipolar type and a habit of swallowing foreign objects who came to the hospital complaining of auditory command hallucinations telling her that she were worthless and commanding her to kill herself. She did not attempt to harm herself at this time around and came to the hospital for help.  1. Suicidal ideation. The patient is able to contract for safety in the hospital.  2. Mood and psychosis. We will continue all her medications including  Latuda, Prolixin, for psychosis, Trileptal for mood stabilization, and Effexor for depression. Will increase latuda from 80 to 120 and Effexor from 150 to 225 mg.  3. Insomnia. We will switch trazodone to restoril 15 mg..  4. Hypothyroidism. We'll continue Synthroid.  5. Hypertension. We'll continue Norvasc.  6. Constipation. We'll continue Colace.  7. UTI with fever. Will offer Fosfomycin, UA pending.  8. Disposition. She will be discharged to her group home. Follow up with her regular provider.    Linley Moskal 12/08/2014, 11:49 AM

## 2014-12-08 NOTE — BHH Group Notes (Signed)
[facility] Group Notes:  (Nursing/MHT/Case Management/Adjunct)  Date:  12/08/2014  Time:  12:38 PM  Type of Therapy:  Psychoeducational Skills  Participation Level:  Did Not Attend   Adela Lank [facility] 12/08/2014, 12:38 PM

## 2014-12-08 NOTE — Plan of Care (Signed)
Problem: Ineffective individual coping Goal: LTG: Patient will report a decrease in negative feelings Outcome: Progressing Patient states she feels better emotionally. Goal: STG: Pt will be able to identify effective and ineffective STG: Pt will be able to identify effective and ineffective coping patterns  Outcome: Progressing Patient notes ineffective coping skills as swallowing items, and appropriate coping as coming to the hospital to get help. Goal: STG: Patient will remain free from self harm Outcome: Progressing No self harm.

## 2014-12-09 MED ORDER — VENLAFAXINE HCL ER 75 MG PO CP24: 150.0000 mg | ORAL_CAPSULE | Freq: Every day | ORAL | Status: DC

## 2014-12-09 MED ORDER — LURASIDONE HCL 40 MG PO TABS: 80.0000 mg | ORAL_TABLET | Freq: Every day | ORAL | Status: DC

## 2014-12-09 MED ORDER — TEMAZEPAM 15 MG PO CAPS: 15.0000 mg | ORAL_CAPSULE | Freq: Every evening | ORAL | Status: DC | PRN

## 2014-12-09 NOTE — Discharge Summary (Signed)
Physician Discharge Summary Note  Patient:  Melanie Bell is an 24 y.o., female MRN:  [id number] DOB:  16-Feb-1991 Patient phone:  [phone] (home)  Patient address:   [place] [address],  Total Time spent with patient: 30 minutes  Date of Admission:  12/05/2014 Date of Discharge: 12/09/2014  Reason for Admission:  Suicidal ideation and command auditory hallucinations.  Identifying data. Melanie Bell is a 24 year old female with a history of schizoaffective disorder.  Chief complaint. "I have voices."   History of present illness. Mrs. under some has a long history of psychosis depression and mood instability. She has been hospitalized at [facility] times. She was discharged from here at the end of June. She returned to the hospital several times after she swallowed foreign objects which is her habit. She returns to the emergency room again complaining of severe depression with poor sleep, decreased appetite, anhedonia, feeling of guilt and hopelessness worthlessness, poor energy and concentration, social isolation, crying spells and now auditory hallucinations telling her that she is worthless and that she needs to kill herself. This time around she did not try to harm herself and came to the hospital for help. She denies excessive anxiety. She denies symptoms suggestive of bipolar mania. She is not paranoid or delusional. She reports good compliance with medication. There are no new social stressors or identifiable reasons for worsening of her condition. She does not use alcohol or drugs.   Past psychiatric history. Multiple psychiatric admissions for depression psychosis or self-injurious behavior. She's been tried on multiple medications apparently current combination of Trileptal Latuda Prolixin and Effexor has been working the best.   Family psychiatric history. Mother with depression and anxiety.   Social history. She is disabled from mental  illness. She lives in a group home and likes it there okay. There is limited social support since her mother divorced her stepfather and relocated to [place]. She no longer has support of her stepgrandmother.  Principal Problem: Schizoaffective disorder, bipolar type Discharge Diagnoses: Patient Active Problem List   Diagnosis Date Noted  . 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 Schizoaffective disorder, bipolar type [F25.0] 11/06/2014  . Tardive dyskinesia [G24.01] 10/06/2014  . Tobacco use disorder [Z72.0] 09/30/2014  . H/O foreign body ingestion [Z87.821] 09/29/2014  . Hypothyroid [E03.9] 09/29/2014  . Hypertension [I10] 09/29/2014  . Constipation [K59.00] 09/29/2014    Musculoskeletal: Strength & Muscle Tone: within normal limits Gait & Station: normal Patient leans: N/A  Psychiatric Specialty Exam: Physical Exam  Nursing note and vitals reviewed.   Review of Systems  Genitourinary: Positive for frequency.  All other systems reviewed and are negative.   Blood pressure 109/76, pulse 97, temperature 98.9 F (37.2 C), temperature source Oral, resp. rate 20, height 5' 7.5" (1.715 m), weight 96.163 kg (212 lb), SpO2 100 %.Body mass index is 32.69 kg/(m^2).  See SRA.                                                  Sleep:  Number of Hours: 9   Have you used any form of tobacco in the last 30 days? (Cigarettes, Smokeless Tobacco, Cigars, and/or Pipes): Yes  Has this patient used any form of tobacco in the last 30 days? (Cigarettes, Smokeless Tobacco, Cigars, and/or Pipes) Yes, A prescription for an FDA-approved tobacco  cessation medication was offered at discharge and the patient refused  Past Medical History:  Past Medical History  Diagnosis Date  . Hallucinations 09/30/2014    Sizoaffective  . Depression   . Anxiety   . Hypertension   . Tardive dyskinesia 10/2014    recent onset  . GERD (gastroesophageal reflux disease)     Past  Surgical History  Procedure Laterality Date  . Breast lumpectomy    . Wisdom tooth extraction    . Abdominal surgery      "years ago" to remove foreign objects  . Esophagogastroduodenoscopy N/A 11/28/2014    Procedure: ESOPHAGOGASTRODUODENOSCOPY (EGD);  Surgeon: [name], MD;  Location: [facility] ENDOSCOPY;  Service: Endoscopy;  Laterality: N/A;   Family History:  Family History  Problem Relation Age of Onset  . Depression Mother   . Hypertension Mother    Social History:  History  Alcohol Use No     History  Drug Use No    History   Social History  . Marital Status: Single    Spouse Name: N/A  . Number of Children: N/A  . Years of Education: N/A   Social History Main Topics  . Smoking status: Current Every Day Smoker -- 0.50 packs/day for 3 years    Types: Cigarettes  . Smokeless tobacco: Never Used  . Alcohol Use: No  . Drug Use: No  . Sexual Activity: No   Other Topics Concern  . None   Social History Narrative    Past Psychiatric History: Hospitalizations:  Outpatient Care:  Substance Abuse Care:  Self-Mutilation:  Suicidal Attempts:  Violent Behaviors:   Risk to Self: Is patient at risk for suicide?: No Risk to Others:   Prior Inpatient Therapy:   Prior Outpatient Therapy:    Level of Care:  OP  Hospital Course:    Melanie Bell is a 24 year old female well known to [name] with a history of schizoaffective disorder bipolar type and a habit of swallowing foreign objects who came to the hospital complaining of auditory command hallucinations telling her that she were worthless and commanding her to kill herself. She did not attempt to harm herself at this time around and came to the hospital for help.  1. Suicidal ideation. This has resolved. The patient is able to contract for safety.   2. Mood and psychosis. We will continue all her medications including Latuda, Prolixin, for psychosis, Trileptal for mood stabilization, and Effexor for depression. We  increased latuda from 80 to 120 and Effexor from 150 to 225 mg while in the hospital. As the patient admitted that she was attention seeking we will discharge her home on her regular doses.  3. Insomnia. We switched trazodone to restoril 15 mg..  4. Hypothyroidism. We'll continue Synthroid.  5. Hypertension. We'll continue Norvasc.  6. Constipation. We'll continue Colace.  7. UTI with fever. We treated with Fosfomycin.  8. Disposition. She was discharged to her group home. Follow up with her regular provider.  Consults:  None  Significant Diagnostic Studies:  None  Discharge Vitals:   Blood pressure 109/76, pulse 97, temperature 98.9 F (37.2 C), temperature source Oral, resp. rate 20, height 5' 7.5" (1.715 m), weight 96.163 kg (212 lb), SpO2 100 %. Body mass index is 32.69 kg/(m^2). Lab Results:   Results for orders placed or performed during the hospital encounter of 12/05/14 (from the past 72 hour(s))  Urinalysis complete, with microscopic ([facility] only)     Status: Abnormal   Collection Time:  12/08/14 11:52 AM  Result Value Ref Range   Color, Urine YELLOW (A) YELLOW   APPearance HAZY (A) CLEAR   Glucose, UA NEGATIVE NEGATIVE mg/dL   Bilirubin Urine NEGATIVE NEGATIVE   Ketones, ur NEGATIVE NEGATIVE mg/dL   Specific Gravity, Urine 1.003 (L) 1.005 - 1.030   Hgb urine dipstick 2+ (A) NEGATIVE   pH 7.0 5.0 - 8.0   Protein, ur NEGATIVE NEGATIVE mg/dL   Nitrite NEGATIVE NEGATIVE   Leukocytes, UA 3+ (A) NEGATIVE   RBC / HPF 0-5 0 - 5 RBC/hpf   WBC, UA TOO NUMEROUS TO COUNT 0 - 5 WBC/hpf   Bacteria, UA MANY (A) NONE SEEN   Squamous Epithelial / LPF NONE SEEN NONE SEEN   Mucous PRESENT     Physical Findings: AIMS:  , ,  ,  , Dental Status Current problems with teeth and/or dentures?: No  CIWA:    COWS:      See Psychiatric Specialty Exam and Suicide Risk Assessment completed by Attending Physician prior to discharge.  Discharge destination:  Home  Is patient on multiple  antipsychotic therapies at discharge:  No   Has Patient had three or more failed trials of antipsychotic monotherapy by history:  No    Recommended Plan for Multiple Antipsychotic Therapies: NA     Medication List    ASK your doctor about these medications      Indication   amLODipine 2.5 MG tablet  Commonly known as:  NORVASC  Take 1 tablet (2.5 mg total) by mouth daily.      benztropine 1 MG tablet  Commonly known as:  COGENTIN  Take 1 tablet (1 mg total) by mouth at bedtime.      docusate sodium 100 MG capsule  Commonly known as:  COLACE  Take 2 capsules (200 mg total) by mouth 2 (two) times daily.      fluPHENAZine 10 MG tablet  Commonly known as:  PROLIXIN  Take 1 tablet (10 mg total) by mouth at bedtime.      fluPHENAZine 5 MG tablet  Commonly known as:  PROLIXIN  Take 1 tablet (5 mg total) by mouth daily. At 4 pm      levothyroxine 75 MCG tablet  Commonly known as:  SYNTHROID, LEVOTHROID  Take 1 tablet (75 mcg total) by mouth daily before breakfast.      lurasidone 80 MG Tabs tablet  Commonly known as:  LATUDA  Take 1 tablet (80 mg total) by mouth daily with supper.      OXcarbazepine 150 MG tablet  Commonly known as:  TRILEPTAL  Take 1 tablet (150 mg total) by mouth 2 (two) times daily.      senna 8.6 MG Tabs tablet  Commonly known as:  SENOKOT  Take 2 tablets (17.2 mg total) by mouth at bedtime.      traZODone 50 MG tablet  Commonly known as:  DESYREL  Take 1 tablet (50 mg total) by mouth at bedtime as needed for sleep.      venlafaxine XR 150 MG 24 hr capsule  Commonly known as:  EFFEXOR-XR  Take 1 capsule (150 mg total) by mouth daily with breakfast.            Follow-up Information    Follow up with [facility], NP. Go in 6 days.   Why:  For follow-up care; hospital followup Monday 12/15/14 at 1:00pm   Contact information:   [place], [place] Ph  [phone] Fax [phone]        Follow-up recommendations:  Activity:  As tolerated. Diet:  Low sodium heart healthy. Other:  Keep follow-up appointments.  Comments:    Total Discharge Time: 35 min.  Signed: Orson Slick 12/09/2014, 2:17 PM

## 2014-12-09 NOTE — BHH Suicide Risk Assessment (Signed)
[facility] Discharge Suicide Risk Assessment   Demographic Factors:  Adolescent or young adult and Caucasian  Total Time spent with patient: 30 minutes  Musculoskeletal: Strength & Muscle Tone: within normal limits Gait & Station: normal Patient leans: N/A  Psychiatric Specialty Exam: Physical Exam  Nursing note and vitals reviewed.   Review of Systems  Genitourinary: Positive for frequency.  All other systems reviewed and are negative.   Blood pressure 109/76, pulse 97, temperature 98.9 F (37.2 C), temperature source Oral, resp. rate 20, height 5' 7.5" (1.715 m), weight 96.163 kg (212 lb), SpO2 100 %.Body mass index is 32.69 kg/(m^2).  General Appearance: Casual  Eye Contact::  Good  Speech:  Clear and [id number]  Volume:  Normal  Mood:  Euthymic  Affect:  Appropriate  Thought Process:  Goal Directed  Orientation:  Full (Time, Place, and Person)  Thought Content:  WDL  Suicidal Thoughts:  No  Homicidal Thoughts:  No  Memory:  Immediate;   Fair Recent;   Fair Remote;   Fair  Judgement:  Fair  Insight:  Fair  Psychomotor Activity:  Normal  Concentration:  Fair  Recall:  [employer] [place]  Language: Fair  Akathisia:  No  Handed:  Right  AIMS (if indicated):     Assets:  Communication Skills Desire for Improvement Financial Resources/Insurance Housing Social Support  Sleep:  Number of Hours: 9  Cognition: WNL  ADL's:  Intact   Have you used any form of tobacco in the last 30 days? (Cigarettes, Smokeless Tobacco, Cigars, and/or Pipes): Yes  Has this patient used any form of tobacco in the last 30 days? (Cigarettes, Smokeless Tobacco, Cigars, and/or Pipes) Yes, A prescription for an FDA-approved tobacco cessation medication was offered at discharge and the patient refused  Mental Status Per Nursing Assessment::   On Admission:  NA  Current Mental Status by Physician: NA  Loss Factors: NA  Historical Factors: Prior suicide attempts and  Impulsivity  Risk Reduction Factors:   Sense of responsibility to family, Living with another person, especially a relative, Positive social support and Positive therapeutic relationship  Continued Clinical Symptoms:  Schizophrenia:   Depressive state Less than 34 years old  Cognitive Features That Contribute To Risk:  None    Suicide Risk:  Minimal: No identifiable suicidal ideation.  Patients presenting with no risk factors but with morbid ruminations; may be classified as minimal risk based on the severity of the depressive symptoms  Principal Problem: Schizoaffective disorder, bipolar type Discharge Diagnoses:  Patient Active Problem List   Diagnosis Date Noted  . 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 Schizoaffective disorder, bipolar type [F25.0] 11/06/2014  . Tardive dyskinesia [G24.01] 10/06/2014  . Tobacco use disorder [Z72.0] 09/30/2014  . H/O foreign body ingestion [Z87.821] 09/29/2014  . Hypothyroid [E03.9] 09/29/2014  . Hypertension [I10] 09/29/2014  . Constipation [K59.00] 09/29/2014      Plan Of Care/Follow-up recommendations:  Activity:  as tolerated. Diet:  low sodium heart healthy. Other:  keep follow up appointments.  Is patient on multiple antipsychotic therapies at discharge:  No   Has Patient had three or more failed trials of antipsychotic monotherapy by history:  No  Recommended Plan for Multiple Antipsychotic Therapies: NA    Melanie Bell 12/09/2014, 11:17 AM

## 2014-12-09 NOTE — BHH Suicide Risk Assessment (Signed)
[place] INPATIENT:  Family/Significant Other Suicide Prevention Education  Suicide Prevention Education:  Education Completed; Melanie Bell (mother/guardian) [phone] has been identified by the patient as the family member/significant other with whom the patient will be residing, and identified as the person(s) who will aid the patient in the event of a mental health crisis (suicidal ideations/suicide attempt).  With written consent from the patient, the family member/significant other has been provided the following suicide prevention education, prior to the and/or following the discharge of the patient.  The suicide prevention education provided includes the following:  Suicide risk factors  Suicide prevention and interventions  National Suicide Hotline telephone number  [facility] assessment telephone number  [facility] Emergency Assistance [place] and/or Residential Mobile Crisis Unit telephone number  Request made of family/significant other to:  Remove weapons (e.g., guns, rifles, knives), all items previously/currently identified as safety concern.    Remove drugs/medications (over-the-counter, prescriptions, illicit drugs), all items previously/currently identified as a safety concern.  The family member/significant other verbalizes understanding of the suicide prevention education information provided.  The family member/significant other agrees to remove the items of safety concern listed above.  Keene Breath, MSW, LCSWA 12/09/2014, 11:47 AM

## 2014-12-09 NOTE — BHH Group Notes (Signed)
[facility] LCSW Aftercare Discharge Planning Group Note  12/09/2014 7:45 AM  Participation Quality:  DID NOT ATTEND  Affect:    Cognitive:    Insight:    Engagement in Group:    Modes of Intervention:    Summary of Progress/Problems:  Melanie Bell 12/09/2014, 7:45 AM

## 2014-12-09 NOTE — Progress Notes (Signed)
Nurs Dischg Note:  D:Patient denies SI/HI at this time. Patient appears calm and cooperative, and no distress noted.  A: All Personal items in locker returned to patient. Pt escorted out of the building. Writer reviewed  Information regarding discharge and perscriptions . Patient aware of follow up appointment. Escorted to exit to Staff at group home Mrs Melanie Bell .    R:  Patient States she will comply with outpatient services, and take MEDS as prescribed.

## 2014-12-09 NOTE — Progress Notes (Signed)
AVS H&P Discharge Summary faxed to [facility] for hospital follow-up

## 2014-12-09 NOTE — BHH Group Notes (Signed)
[place] Group Notes:  (Nursing/MHT/Case Management/Adjunct)  Date:  12/09/2014  Time:  2:09 PM  Type of Therapy:  Psychoeducational Skills  Participation Level:  Did Not Attend    Leonie Douglas 12/09/2014, 2:09 PM

## 2014-12-09 NOTE — Progress Notes (Signed)
  [facility] Adult Case Management Discharge Plan :  Will you be returning to the same living situation after discharge:  Yes,  patient will return to Ms Watlington's group home At discharge, do you have transportation home?: Yes,  group home will pick up at discharge Do you have the ability to pay for your medications: Yes,  patient has Medicaid   Release of information consent forms completed and in the chart;  Patient's signature needed at discharge.  Patient to Follow up at: Follow-up Information    Follow up with [facility], NP. Go in 6 days.   Why:  For follow-up care; hospital followup Monday 12/15/14 at 1:00pm   Contact information:   [place], [place] Fax [phone]       Patient denies SI/HI: Yes,  patient denies SI/HI    Safety Planning and Suicide Prevention discussed: Yes,  SPE discussed with patient, [name] (mother/guardian), and Ms Oswaldo Milian at patient's group home  Have you used any form of tobacco in the last 30 days? (Cigarettes, Smokeless Tobacco, Cigars, and/or Pipes): Yes  Has patient been referred to the Quitline?: Patient refused referral  Keene Breath, MSW, LCSWA 12/09/2014, 11:48 AM

## 2014-12-09 NOTE — Progress Notes (Signed)
Was resting at onset of shift. Stated was feeling better that in the morning. Temp was rechecked and was 100.7 Denies pain. Seems cheerful and pleasant and denies SI, HI, and AVH. Retreated to bed for rest after group.

## 2014-12-09 NOTE — Plan of Care (Signed)
Problem: [facility] Participation in Recreation Therapeutic Interventions Goal: STG-Patient will identify at least five coping skills for ** STG: Coping Skills - Within 3 treatment sessions, patient will verbalize at least 5 coping skills for anger in one treatment session to increase anger management skills post d/c.  Outcome: Completed/Met Date Met:  12/09/14 Treatment Session 1; Completed 1 out of 1: At approximately 9:05 am, LRT met with patient in craft room. Patient verbalized 5 coping skills for anger. Patient verbalized what triggers her to get angry, how her body responds to anger, and how she is going to remember to use her healthy coping skills. LRT provided suggestions as well.  Leonette Monarch, LRT/CTRS 07.26.16 12:04 pm

## 2014-12-09 NOTE — Progress Notes (Signed)
Recreation Therapy Notes  INPATIENT RECREATION TR PLAN  Patient Details [name] MRN: [id number] DOB: 1990-10-04 Today's Date: 12/09/2014  Rec Therapy Plan Is patient appropriate for Therapeutic Recreation?: Yes Treatment times per week: At least 2 times a week TR Treatment/Interventions: 1:1 session, Group participation (Comment) (Appropriate participation in daily recreation therapy tx)  Discharge Criteria Pt will be discharged from therapy if:: Discharged Treatment plan/goals/alternatives discussed and agreed upon by:: Patient/family  Discharge Summary Short term goals set: See Care Plan Short term goals met: Complete Progress toward goals comments: One-to-one attended One-to-one attended: Anger Management Reason goals not met: N/A Therapeutic equipment acquired: None Reason patient discharged from therapy: Discharge from hospital Pt/family agrees with progress & goals achieved: Yes Date patient discharged from therapy: 12/09/14   Leonette Monarch, LRT/CTRS 12/09/2014, 12:06 PM

## 2015-01-17 IMAGING — CR DG NECK SOFT TISSUE
2 series · 2 of 2 positions shown · non-contrast
Comparison: [DATE].

CLINICAL DATA: Patient states swallowed thumb tack and razor blade.

EXAM:
NECK SOFT TISSUES - 1+ VIEW

[neck lat]
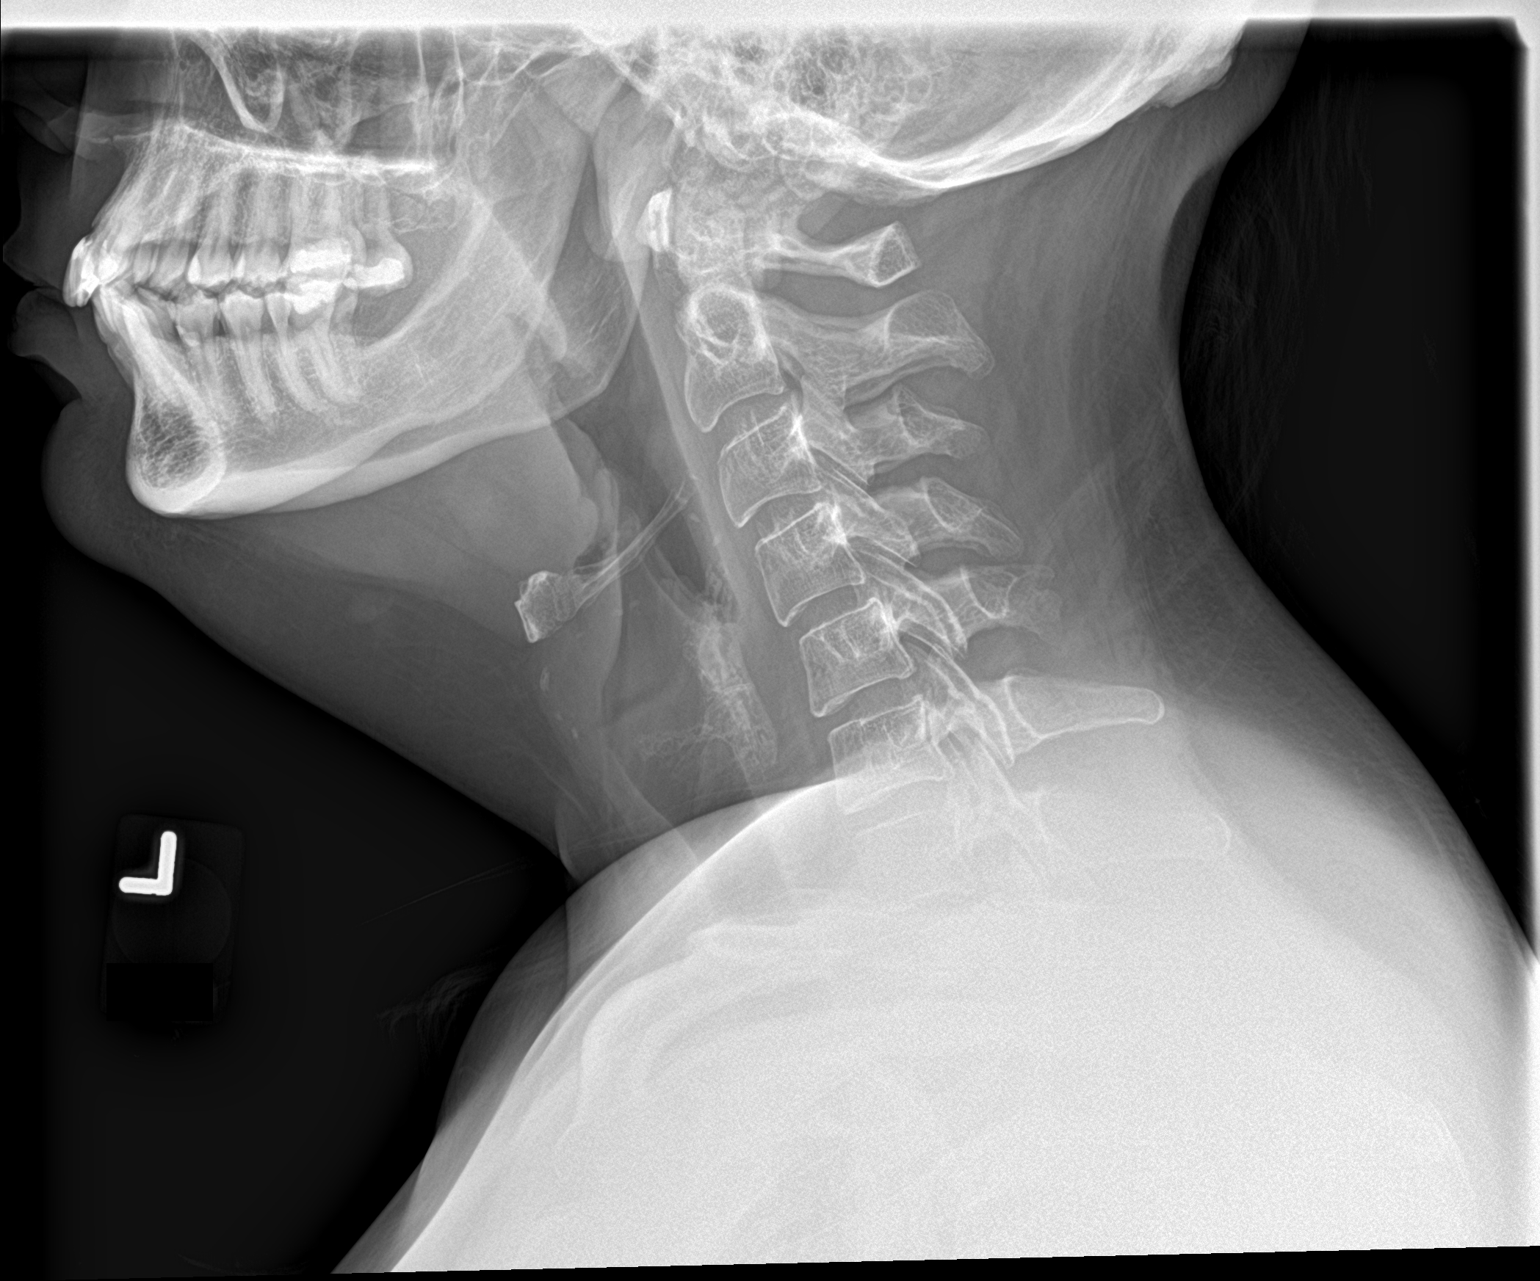

[neck ap]
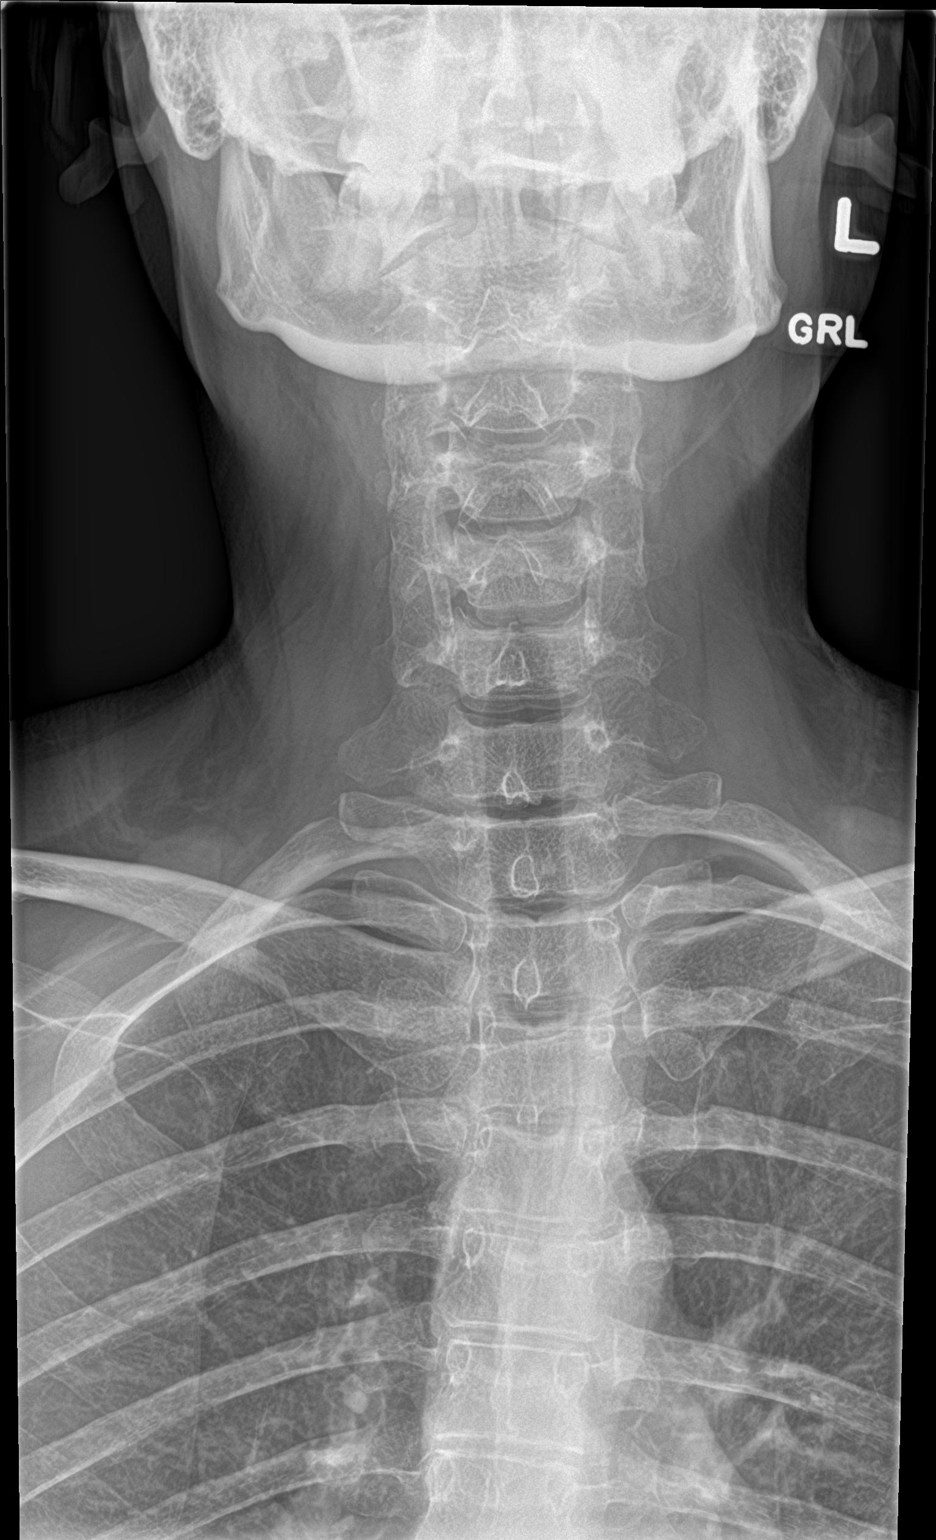

[2 of 2 positions shown; findings below may reference images not displayed]

FINDINGS: There is no evidence of retropharyngeal soft tissue swelling or
epiglottic enlargement. The cervical airway is unremarkable and no
radio-opaque foreign body identified.
IMPRESSION: Negative.

## 2015-06-13 ENCOUNTER — Emergency Department: Payer: Medicaid Other

## 2015-06-13 ENCOUNTER — Encounter: Payer: Self-pay | Admitting: Emergency Medicine

## 2015-06-13 ENCOUNTER — Emergency Department
Admission: EM | Admit: 2015-06-13 | Discharge: 2015-06-13 | Disposition: A | Discharge status: Home / Self Care | Payer: Medicaid Other | Attending: Emergency Medicine | Admitting: Emergency Medicine

## 2015-06-13 DIAGNOSIS — R079 Chest pain, unspecified: Secondary | ICD-10-CM | POA: Diagnosis present

## 2015-06-13 DIAGNOSIS — F17210 Nicotine dependence, cigarettes, uncomplicated: Secondary | ICD-10-CM | POA: Insufficient documentation

## 2015-06-13 DIAGNOSIS — I10 Essential (primary) hypertension: Secondary | ICD-10-CM | POA: Insufficient documentation

## 2015-06-13 DIAGNOSIS — R0789 Other chest pain: Secondary | ICD-10-CM | POA: Insufficient documentation

## 2015-06-13 DIAGNOSIS — G249 Dystonia, unspecified: Secondary | ICD-10-CM | POA: Insufficient documentation

## 2015-06-13 DIAGNOSIS — Z79899 Other long term (current) drug therapy: Secondary | ICD-10-CM | POA: Diagnosis not present

## 2015-06-13 HISTORY — DX: Hyperlipidemia, unspecified: E78.5

## 2015-06-13 LAB — BASIC METABOLIC PANEL
ANION GAP: 4 — AB (ref 5–15)
BUN: 6 mg/dL (ref 6–20)
CHLORIDE: 112 mmol/L — AB (ref 101–111)
CO2: 23 mmol/L (ref 22–32)
Calcium: 8.9 mg/dL (ref 8.9–10.3)
Creatinine, Ser: 0.66 mg/dL (ref 0.44–1.00)
GFR calc non Af Amer: >60 mL/min (ref >60)
Glucose, Bld: 98 mg/dL (ref 65–99)
Potassium: 3.4 mmol/L — ABNORMAL LOW (ref 3.5–5.1)
SODIUM: 139 mmol/L (ref 135–145)

## 2015-06-13 LAB — CBC
HCT: 35.2 % (ref 35.0–47.0)
HEMOGLOBIN: 11.9 g/dL — AB (ref 12.0–16.0)
MCH: 30.6 pg (ref 26.0–34.0)
MCHC: 33.9 g/dL (ref 32.0–36.0)
MCV: 90.2 fL (ref 80.0–100.0)
Platelets: 193 10*3/uL (ref 150–440)
RBC: 3.9 MIL/uL (ref 3.80–5.20)
RDW: 14.3 % (ref 11.5–14.5)
WBC: 5.7 10*3/uL (ref 3.6–11.0)

## 2015-06-13 LAB — TROPONIN I: Troponin I: <0.03 ng/mL (ref <0.031)

## 2015-06-13 IMAGING — CR DG CHEST 2V
2 series · 2 of 2 positions shown · non-contrast
Comparison: [DATE]

CLINICAL DATA: Sharp shooting pains in the left upper chest
beginning around [RA] hours today. Left arm pain. Swimmy headed
feeling. Intermittent vision loss.

EXAM:
CHEST  2 VIEW

[chest pa]
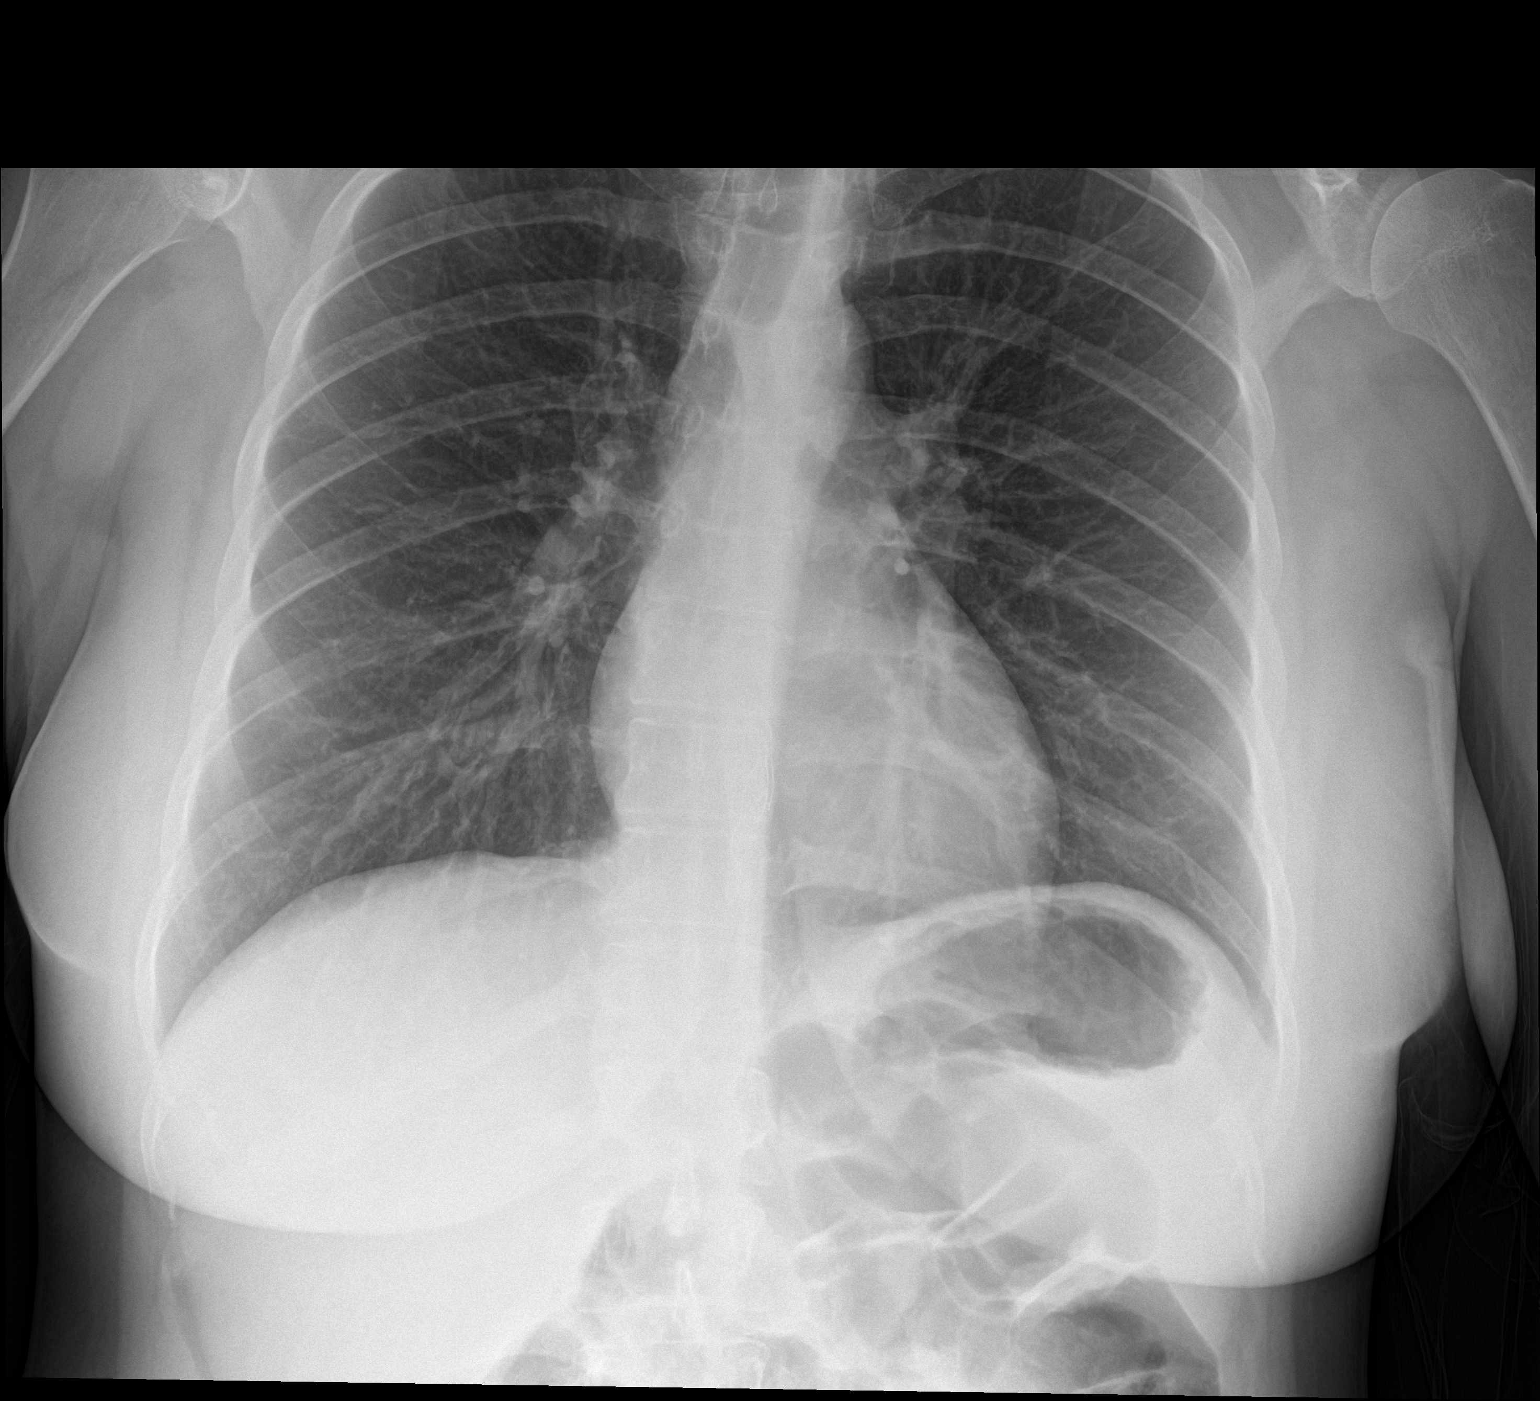

[chest lat]
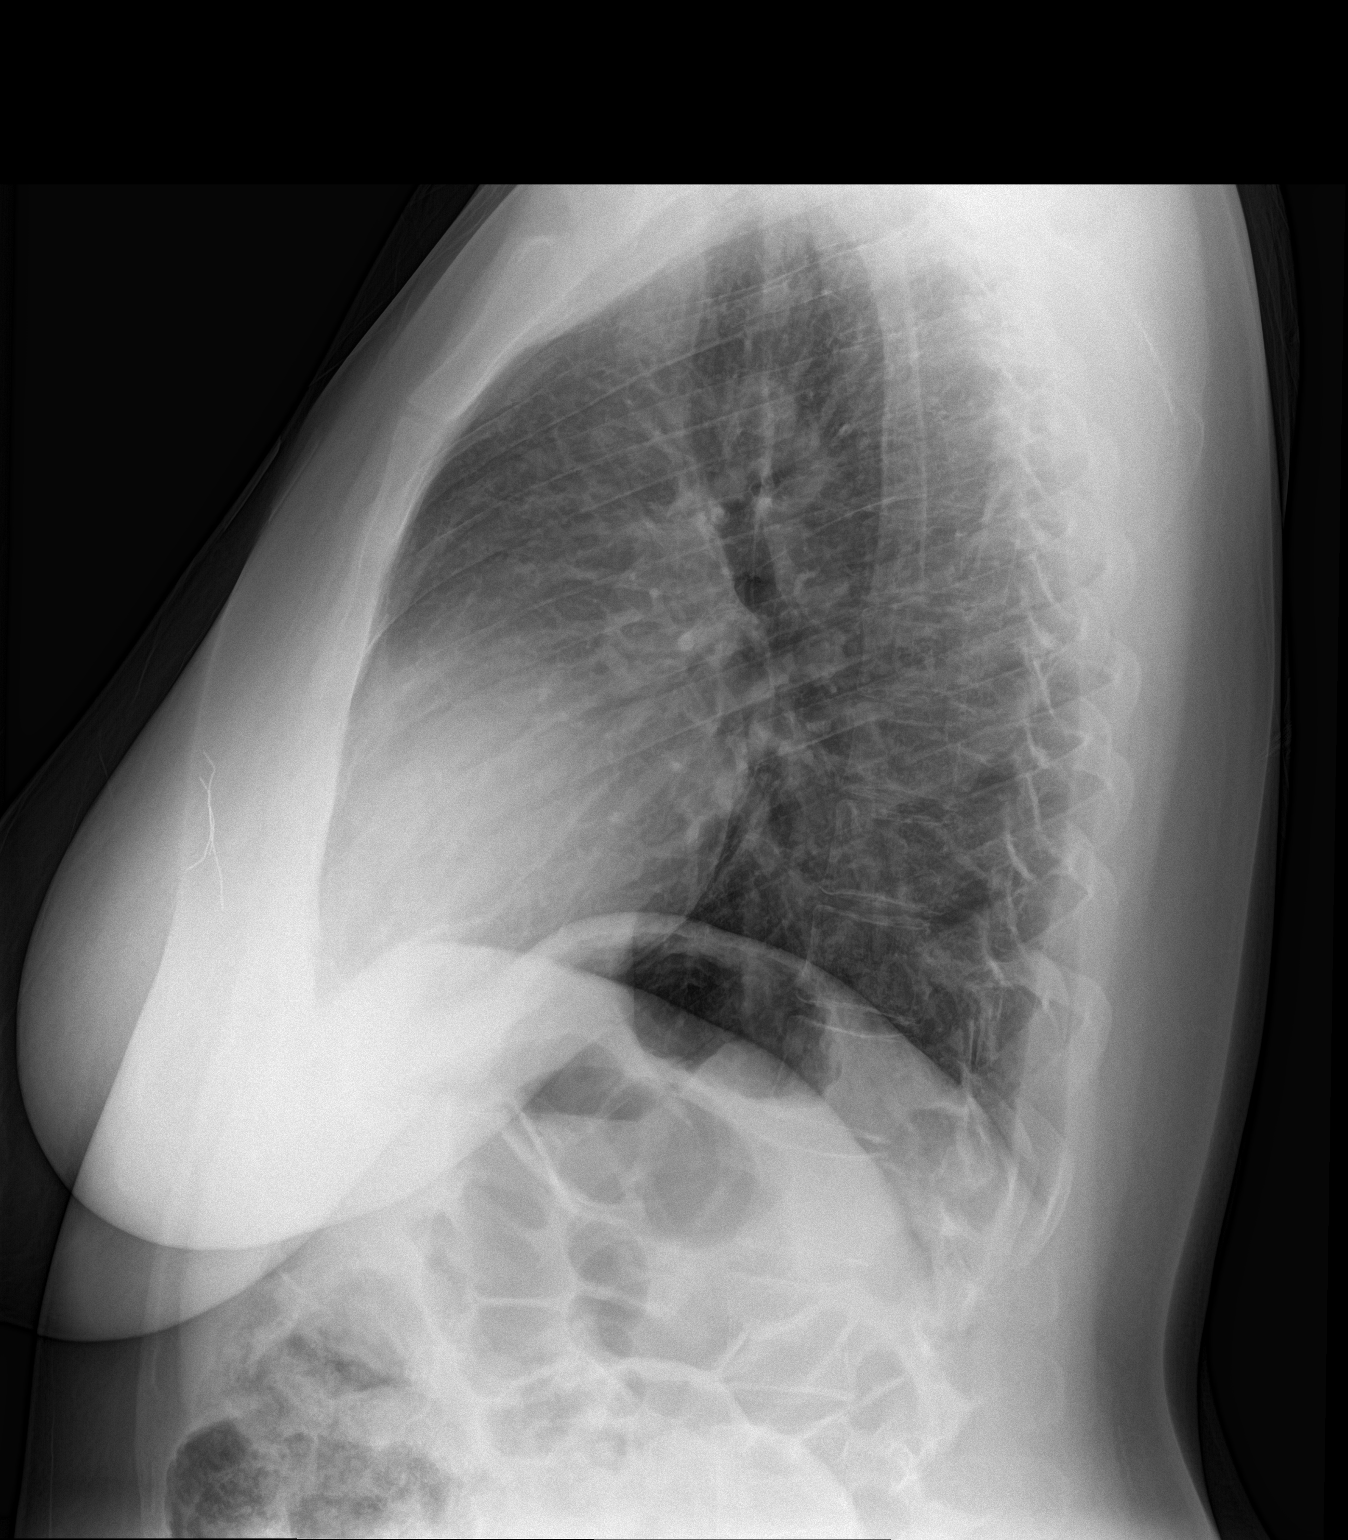

[2 of 2 positions shown; findings below may reference images not displayed]

FINDINGS: The heart size and mediastinal contours are within normal limits.
Both lungs are clear. The visualized skeletal structures are
unremarkable. Linear metallic structures, possibly suture material
demonstrated in the medial aspect right breast shadow. No change
since previous study.
IMPRESSION: No active cardiopulmonary disease.

## 2015-06-13 MED ORDER — IBUPROFEN 800 MG PO TABS
800.0000 mg | ORAL_TABLET | Freq: Three times a day (TID) | ORAL | Status: DC | PRN
Start: 2015-06-13 — End: 2015-06-19

## 2015-06-13 MED ORDER — KETOROLAC TROMETHAMINE 30 MG/ML IJ SOLN
60.0000 mg | Freq: Once | INTRAMUSCULAR | Status: AC
  Administered 2015-06-13: 60 mg via INTRAMUSCULAR
  Filled 2015-06-13: qty 2

## 2015-06-13 NOTE — ED Notes (Signed)
Caregiver waiting outside for pt.

## 2015-06-13 NOTE — ED Notes (Signed)
Gare giver called and notified of pt's dc. Care giver informed that we did not give pt's night-time meds and that she will have to come and pick up the pt. Care giver verbalized understanding.

## 2015-06-13 NOTE — ED Notes (Signed)
EMS pt from [facility] with c/o left upper chest pain; tenderness on palpation;

## 2015-06-13 NOTE — ED Notes (Signed)
Pt called out stating that she was having blackouts. Pt answering appropriately and meeting eyes. Pt saturation 100%.

## 2015-06-13 NOTE — ED Notes (Signed)
Dc'd pt and walked her to caregiver's car. Pt went home.

## 2015-06-13 NOTE — ED Notes (Signed)
Patient brought in by ems. Patient reports left side chest pain that radiates down her left arm. Patient states that the pain started about 3 hours ago.  Patient reports that the pain is worse with inspiration. Patient denies shortness of breath, nausea or vomiting.

## 2015-06-13 NOTE — ED Provider Notes (Signed)
Time Seen: Approximately 2020  I have reviewed the triage notes  Chief Complaint: Chest Pain   History of Present Illness: Melanie Bell is a 25 y.o. female who was transported here by EMS for substernal chest discomfort radiating up the left upper chest wall region. She states her pain is worse with movement and deep inspiration. She denies any shortness of breath, nausea, vomiting. Patient is on Depo-Provera but otherwise has no history of blood clots in legs or lungs. She denies any other pulmonary emboli risk factors such as travel, etc. She denies any family history of early cardiovascular disease or blood clots that she is aware of. She denies any leg pain or swelling. She denies any fever or chills or productive cough.   Past Medical History  Diagnosis Date  . Hallucinations 09/30/2014    Sizoaffective  . Depression   . Anxiety   . Hypertension   . Tardive dyskinesia 10/2014    recent onset  . GERD (gastroesophageal reflux disease)   . Hyperlipidemia     Patient Active Problem List   Diagnosis Date Noted  . 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 Schizoaffective disorder, bipolar type ([place]) 11/06/2014  . Tardive dyskinesia 10/06/2014  . Tobacco use disorder 09/30/2014  . H/O foreign body ingestion 09/29/2014  . Hypothyroid 09/29/2014  . Hypertension 09/29/2014  . Constipation 09/29/2014    Past Surgical History  Procedure Laterality Date  . Wisdom tooth extraction    . Esophagogastroduodenoscopy N/A 11/28/2014    Procedure: ESOPHAGOGASTRODUODENOSCOPY (EGD);  Surgeon: [name], MD;  Location: [facility] ENDOSCOPY;  Service: Endoscopy;  Laterality: N/A;  . Abdominal surgery      "years ago" to remove foreign objects  . Breast lumpectomy      Past Surgical History  Procedure Laterality Date  . Wisdom tooth extraction    . Esophagogastroduodenoscopy N/A 11/28/2014    Procedure: ESOPHAGOGASTRODUODENOSCOPY (EGD);  Surgeon: [name], MD;  Location:  [facility] ENDOSCOPY;  Service: Endoscopy;  Laterality: N/A;  . Abdominal surgery      "years ago" to remove foreign objects  . Breast lumpectomy      Current Outpatient Rx  Name  Route  Sig  Dispense  Refill  . amLODipine (NORVASC) 2.5 MG tablet   Oral   Take 1 tablet (2.5 mg total) by mouth daily.   30 tablet   0   . benztropine (COGENTIN) 1 MG tablet   Oral   Take 1 tablet (1 mg total) by mouth at bedtime.   30 tablet   0   . docusate sodium (COLACE) 100 MG capsule   Oral   Take 2 capsules (200 mg total) by mouth 2 (two) times daily.   120 capsule   0   . fluPHENAZine (PROLIXIN) 10 MG tablet   Oral   Take 1 tablet (10 mg total) by mouth at bedtime.   30 tablet   0   . fluPHENAZine (PROLIXIN) 5 MG tablet   Oral   Take 1 tablet (5 mg total) by mouth daily. At 4 pm   60 tablet   0   . ibuprofen (ADVIL,MOTRIN) 800 MG tablet   Oral   Take 1 tablet (800 mg total) by mouth every 8 (eight) hours as needed.   30 tablet   0   . levothyroxine (SYNTHROID, LEVOTHROID) 75 MCG tablet   Oral   Take 1 tablet (75 mcg total) by mouth daily before breakfast.   30 tablet  0   . lurasidone (LATUDA) 80 MG TABS tablet   Oral   Take 1 tablet (80 mg total) by mouth daily with supper.   30 tablet   0   . OXcarbazepine (TRILEPTAL) 150 MG tablet   Oral   Take 1 tablet (150 mg total) by mouth 2 (two) times daily.   60 tablet   0   . senna (SENOKOT) 8.6 MG TABS tablet   Oral   Take 2 tablets (17.2 mg total) by mouth at bedtime.   60 each   0   . temazepam (RESTORIL) 15 MG capsule   Oral   Take 1 capsule (15 mg total) by mouth at bedtime as needed for sleep.   30 capsule   0   . venlafaxine XR (EFFEXOR-XR) 150 MG 24 hr capsule   Oral   Take 1 capsule (150 mg total) by mouth daily with breakfast.   30 capsule   0     Allergies:  Betadine; Shellfish-derived products; and Iodine  Family History: Family History  Problem Relation Age of Onset  . Depression Mother   .  Hypertension Mother     Social History: Social History  Substance Use Topics  . Smoking status: Current Every Day Smoker -- 0.50 packs/day for 3 years    Types: Cigarettes  . Smokeless tobacco: Never Used  . Alcohol Use: No     Review of Systems:   10 point review of systems was performed and was otherwise negative:  Constitutional: No fever Eyes: No visual disturbances ENT: No sore throat, ear pain Cardiac: No chest pain Respiratory: No shortness of breath, wheezing, or stridor Abdomen: No abdominal pain, no vomiting, No diarrhea Endocrine: No weight loss, No night sweats Extremities: No peripheral edema, cyanosis Skin: No rashes, easy bruising Neurologic: Patient has tardive dyskinesia and has spontaneous movement of both upper and lower extremities which is not unusual for her. Urologic: No dysuria, Hematuria, or urinary frequency   Physical Exam:  ED Triage Vitals  Enc Vitals Group     BP 06/13/15 1914 129/81 mmHg     Pulse Rate 06/13/15 1914 90     Resp 06/13/15 1914 0     Temp 06/13/15 1914 99.3 F (37.4 C)     Temp Source 06/13/15 1914 Oral     SpO2 06/13/15 1914 97 %     Weight 06/13/15 1914 190 lb (86.183 kg)     Height 06/13/15 1914 5\' 6"  (1.676 m)     Head Cir --      Peak Flow --      Pain Score 06/13/15 1914 8     Pain Loc --      Pain Edu? --      Excl. in [place]? --     General: Awake , Alert , and Oriented times 3; GCS 15 Head: Normal cephalic , atraumatic Eyes: Pupils equal , round, reactive to light Nose/Throat: No nasal drainage, patent upper airway without erythema or exudate.  Neck: Supple, Full range of motion, No anterior adenopathy or palpable thyroid masses Lungs: Clear to ascultation without wheezes , rhonchi, or rales Heart: Regular rate, regular rhythm without murmurs , gallops , or rubs Abdomen: Soft, non tender without rebound, guarding , or rigidity; bowel sounds positive and symmetric in all 4 quadrants. No organomegaly .         Extremities: 2 plus symmetric pulses. No edema, clubbing or cyanosis Neurologic: normal ambulation, Motor symmetric without deficits, sensory intact Skin:  warm, dry, no rashes Patient has reproducible pain with light palpation across the anterior chest wall area. She denies any recent trauma. Palpation across the upper chest wall totally reproduces the patient's pain. There is no surrounding erythema or warmth  Labs:   All laboratory work was reviewed including any pertinent negatives or positives listed below:  Labs Reviewed  BASIC METABOLIC PANEL - Abnormal; Notable for the following:    Potassium 3.4 (*)    Chloride 112 (*)    Anion gap 4 (*)    All other components within normal limits  CBC - Abnormal; Notable for the following:    Hemoglobin 11.9 (*)    All other components within normal limits  TROPONIN I   reviewed the patient's laboratory work appears within normal limits.  EKG: * ED ECG REPORT I, Daymon Larsen, the attending physician, personally viewed and interpreted this ECG.  Date: 06/13/2015 EKG Time: 1929 Rate: 67 Rhythm: normal sinus rhythm with sinus arrhythmia QRS Axis: normal Intervals: normal ST/T Wave abnormalities: normal Conduction Disturbances: none Narrative Interpretation: unremarkable No acute changes  CLINICAL DATA: Sharp shooting pains in the left upper chest beginning around 1300 hours today. Left arm pain. Swimmy headed feeling. Intermittent vision loss.  EXAM: CHEST 2 VIEW  COMPARISON: 11/27/2014  FINDINGS: The heart size and mediastinal contours are within normal limits. Both lungs are clear. The visualized skeletal structures are unremarkable. Linear metallic structures, possibly suture material demonstrated in the medial aspect right breast shadow. No change since previous study.  IMPRESSION: No active cardiopulmonary disease.  Radiology:       I personally reviewed the radiologic studies    ED Course:  Given  the patient's reproducible pain, her age, and minimal risk factors for life-threatening causes of felt this most likely was chest wall pain. There is also appears to be an anxiety component associated with her discomfort. The patient's EKG shows no rightward changes and she can has minimal risk factors for pulmonary embolism and I felt this was unlikely to be a life-threatening cause such as ischemic chest pain etc.    Assessment:  Acute chest wall pain     Plan: * Outpatient management Patient was advised to return immediately if condition worsens. Patient was advised to follow up with their primary care physician or other specialized physicians involved in their outpatient care             Daymon Larsen, MD 06/13/15 2119

## 2015-06-15 ENCOUNTER — Encounter: Payer: Self-pay | Admitting: Emergency Medicine

## 2015-06-15 ENCOUNTER — Emergency Department
Admission: EM | Admit: 2015-06-15 | Discharge: 2015-06-15 | Disposition: A | Discharge status: Other Acute Inpatient Hospital | Payer: MEDICAID | Attending: Emergency Medicine | Admitting: Emergency Medicine

## 2015-06-15 ENCOUNTER — Inpatient Hospital Stay
Admission: EM | Admit: 2015-06-15 | Discharge: 2015-06-19 | DRG: 885 | Disposition: A | Discharge status: Home / Self Care | Payer: MEDICAID | Source: Intra-hospital | Attending: Psychiatry | Admitting: Psychiatry

## 2015-06-15 ENCOUNTER — Emergency Department: Payer: MEDICAID

## 2015-06-15 DIAGNOSIS — K219 Gastro-esophageal reflux disease without esophagitis: Secondary | ICD-10-CM | POA: Diagnosis present

## 2015-06-15 DIAGNOSIS — Z8249 Family history of ischemic heart disease and other diseases of the circulatory system: Secondary | ICD-10-CM

## 2015-06-15 DIAGNOSIS — Z818 Family history of other mental and behavioral disorders: Secondary | ICD-10-CM

## 2015-06-15 DIAGNOSIS — F17210 Nicotine dependence, cigarettes, uncomplicated: Secondary | ICD-10-CM | POA: Insufficient documentation

## 2015-06-15 DIAGNOSIS — I10 Essential (primary) hypertension: Secondary | ICD-10-CM | POA: Diagnosis present

## 2015-06-15 DIAGNOSIS — E039 Hypothyroidism, unspecified: Secondary | ICD-10-CM | POA: Diagnosis present

## 2015-06-15 DIAGNOSIS — F250 Schizoaffective disorder, bipolar type: Principal | ICD-10-CM | POA: Diagnosis present

## 2015-06-15 DIAGNOSIS — R45851 Suicidal ideations: Secondary | ICD-10-CM | POA: Diagnosis present

## 2015-06-15 DIAGNOSIS — Z79899 Other long term (current) drug therapy: Secondary | ICD-10-CM

## 2015-06-15 DIAGNOSIS — E8881 Metabolic syndrome: Secondary | ICD-10-CM | POA: Diagnosis present

## 2015-06-15 DIAGNOSIS — K5900 Constipation, unspecified: Secondary | ICD-10-CM | POA: Diagnosis present

## 2015-06-15 DIAGNOSIS — G4700 Insomnia, unspecified: Secondary | ICD-10-CM | POA: Diagnosis present

## 2015-06-15 DIAGNOSIS — Z3202 Encounter for pregnancy test, result negative: Secondary | ICD-10-CM | POA: Insufficient documentation

## 2015-06-15 DIAGNOSIS — Z9889 Other specified postprocedural states: Secondary | ICD-10-CM | POA: Diagnosis not present

## 2015-06-15 LAB — CBC
HEMATOCRIT: 34.7 % — AB (ref 35.0–47.0)
HEMOGLOBIN: 11.7 g/dL — AB (ref 12.0–16.0)
MCH: 30.2 pg (ref 26.0–34.0)
MCHC: 33.9 g/dL (ref 32.0–36.0)
MCV: 89.1 fL (ref 80.0–100.0)
Platelets: 199 10*3/uL (ref 150–440)
RBC: 3.89 MIL/uL (ref 3.80–5.20)
RDW: 13.9 % (ref 11.5–14.5)
WBC: 4.8 10*3/uL (ref 3.6–11.0)

## 2015-06-15 LAB — ACETAMINOPHEN LEVEL: Acetaminophen (Tylenol), Serum: <10 ug/mL — ABNORMAL LOW (ref 10–30)

## 2015-06-15 LAB — COMPREHENSIVE METABOLIC PANEL
ALBUMIN: 3.9 g/dL (ref 3.5–5.0)
ALK PHOS: 67 U/L (ref 38–126)
ALT: 15 U/L (ref 14–54)
ANION GAP: 5 (ref 5–15)
AST: 18 U/L (ref 15–41)
BUN: 8 mg/dL (ref 6–20)
CALCIUM: 9.2 mg/dL (ref 8.9–10.3)
CHLORIDE: 114 mmol/L — AB (ref 101–111)
CO2: 22 mmol/L (ref 22–32)
Creatinine, Ser: 0.68 mg/dL (ref 0.44–1.00)
GFR calc non Af Amer: >60 mL/min (ref >60)
GLUCOSE: 97 mg/dL (ref 65–99)
Potassium: 3.4 mmol/L — ABNORMAL LOW (ref 3.5–5.1)
SODIUM: 141 mmol/L (ref 135–145)
Total Bilirubin: <0.1 mg/dL — ABNORMAL LOW (ref 0.3–1.2)
Total Protein: 6.6 g/dL (ref 6.5–8.1)

## 2015-06-15 LAB — URINE DRUG SCREEN, QUALITATIVE (ARMC ONLY)
AMPHETAMINES, UR SCREEN: NOT DETECTED
BARBITURATES, UR SCREEN: NOT DETECTED
Benzodiazepine, Ur Scrn: NOT DETECTED
COCAINE METABOLITE, UR ~~LOC~~: NOT DETECTED
Cannabinoid 50 Ng, Ur ~~LOC~~: NOT DETECTED
MDMA (ECSTASY) UR SCREEN: NOT DETECTED
METHADONE SCREEN, URINE: NOT DETECTED
OPIATE, UR SCREEN: NOT DETECTED
Phencyclidine (PCP) Ur S: NOT DETECTED
TRICYCLIC, UR SCREEN: NOT DETECTED

## 2015-06-15 LAB — ETHANOL: Alcohol, Ethyl (B): <5 mg/dL (ref <5)

## 2015-06-15 LAB — POCT PREGNANCY, URINE: Preg Test, Ur: NEGATIVE

## 2015-06-15 LAB — SALICYLATE LEVEL

## 2015-06-15 IMAGING — CR DG ABDOMEN 1V
1 series · 1 of 1 positions shown · non-contrast
Comparison: [DATE]

CLINICAL DATA: Hallucinations

EXAM:
ABDOMEN - 1 VIEW

[dg abd 1 view]
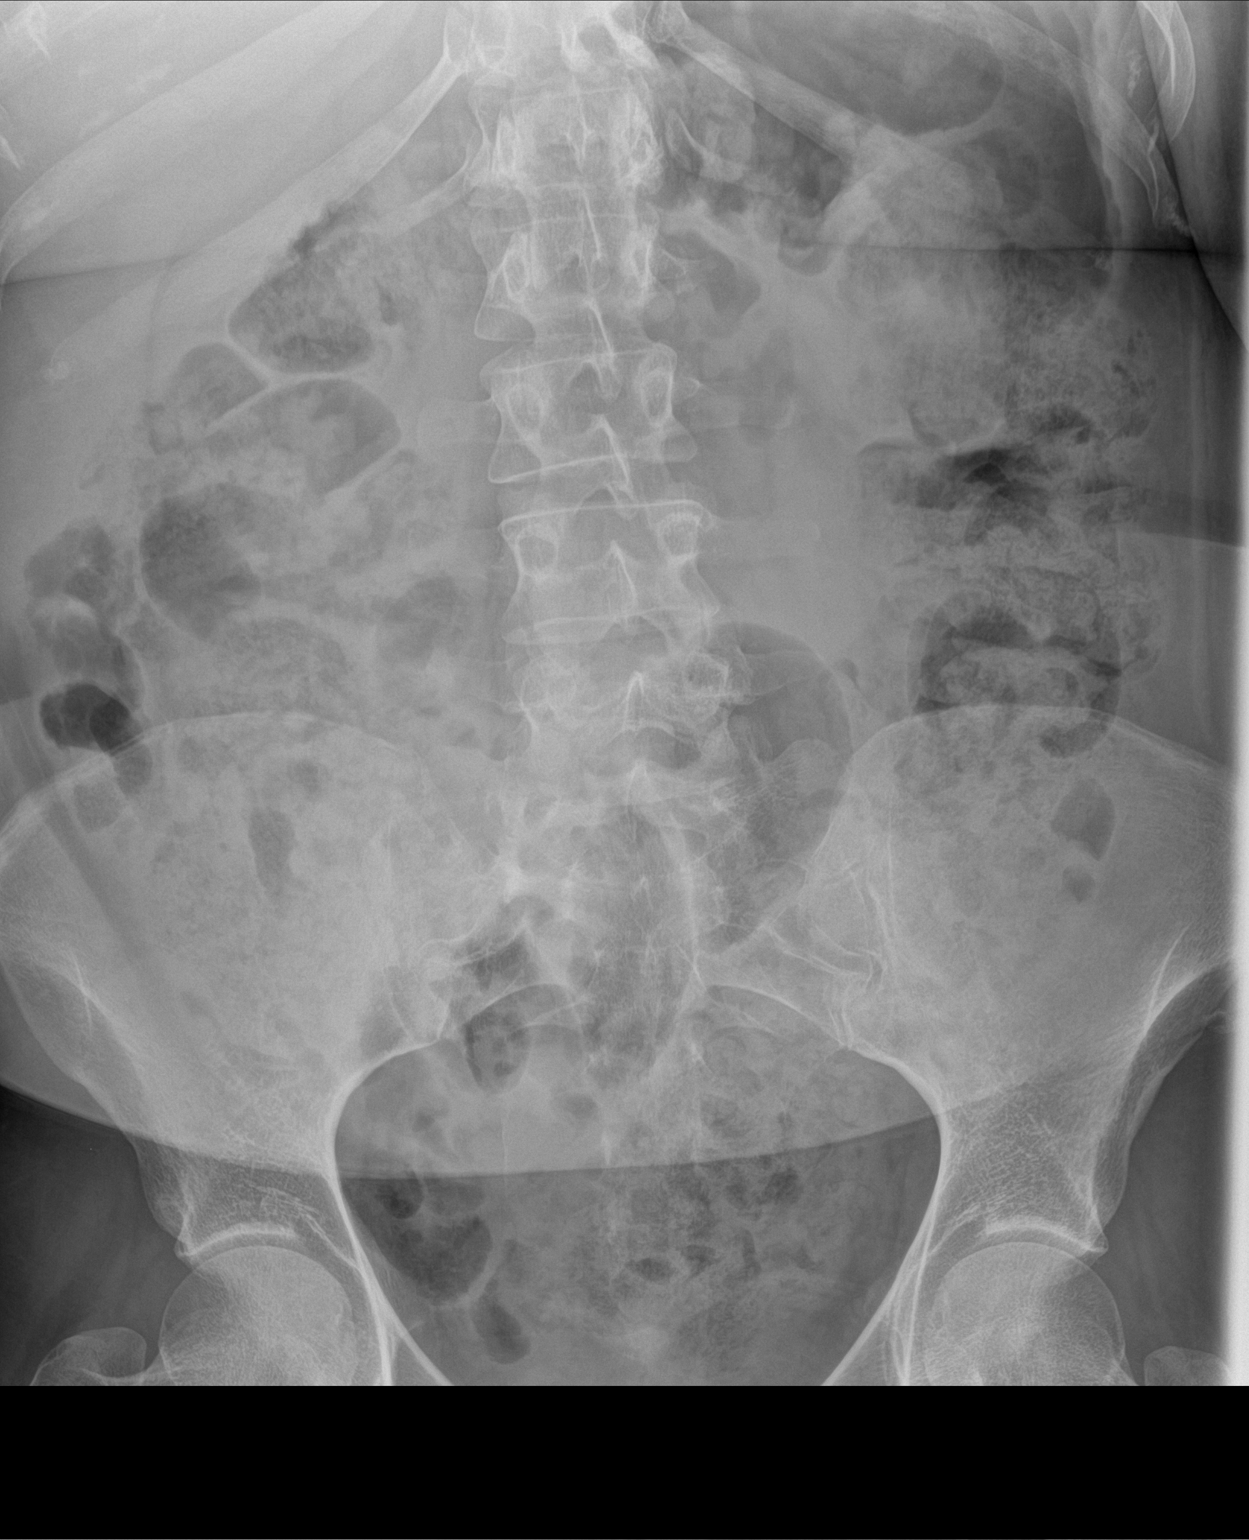

[1 of 1 positions shown; findings below may reference images not displayed]

FINDINGS: There is normal small bowel gas pattern. Moderate stool noted
throughout the colon. No acute infiltrate or pulmonary edema. No
radiopaque foreign body is noted.
IMPRESSION: Normal small bowel gas pattern. Moderate stool throughout the colon.

## 2015-06-15 MED ORDER — ACETAMINOPHEN 325 MG PO TABS: 650.0000 mg | ORAL_TABLET | Freq: Four times a day (QID) | ORAL | Status: DC | PRN

## 2015-06-15 MED ORDER — LEVOTHYROXINE SODIUM 75 MCG PO TABS
75.0000 ug | ORAL_TABLET | Freq: Every day | ORAL | Status: DC
  Filled 2015-06-15: qty 1

## 2015-06-15 MED ORDER — MAGNESIUM HYDROXIDE 400 MG/5ML PO SUSP
30.0000 mL | Freq: Every day | ORAL | Status: DC | PRN
  Administered 2015-06-16: 30 mL via ORAL
  Filled 2015-06-15: qty 30

## 2015-06-15 MED ORDER — BENZTROPINE MESYLATE 1 MG PO TABS
1.0000 mg | ORAL_TABLET | Freq: Every day | ORAL | Status: DC
  Administered 2015-06-16 – 2015-06-18 (×3): 1 mg via ORAL
  Filled 2015-06-15 (×3): qty 1

## 2015-06-15 MED ORDER — LEVOTHYROXINE SODIUM 75 MCG PO TABS
75.0000 ug | ORAL_TABLET | Freq: Every day | ORAL | Status: DC
  Administered 2015-06-16 – 2015-06-19 (×4): 75 ug via ORAL
  Filled 2015-06-15 (×2): qty 3
  Filled 2015-06-15 (×2): qty 1
  Filled 2015-06-15 (×2): qty 3

## 2015-06-15 MED ORDER — DOCUSATE SODIUM 100 MG PO CAPS
100.0000 mg | ORAL_CAPSULE | Freq: Two times a day (BID) | ORAL | Status: DC
  Administered 2015-06-16 – 2015-06-19 (×7): 100 mg via ORAL
  Filled 2015-06-15 (×7): qty 1

## 2015-06-15 MED ORDER — OXCARBAZEPINE 300 MG PO TABS
150.0000 mg | ORAL_TABLET | Freq: Two times a day (BID) | ORAL | Status: DC
  Administered 2015-06-16 – 2015-06-19 (×7): 150 mg via ORAL
  Filled 2015-06-15: qty 2
  Filled 2015-06-15 (×4): qty 1
  Filled 2015-06-15: qty 2
  Filled 2015-06-15 (×4): qty 1

## 2015-06-15 MED ORDER — VENLAFAXINE HCL ER 75 MG PO CP24
150.0000 mg | ORAL_CAPSULE | Freq: Every day | ORAL | Status: DC
  Administered 2015-06-16 – 2015-06-19 (×4): 150 mg via ORAL
  Filled 2015-06-15 (×4): qty 2

## 2015-06-15 MED ORDER — OXCARBAZEPINE 300 MG PO TABS
150.0000 mg | ORAL_TABLET | Freq: Two times a day (BID) | ORAL | Status: DC
  Administered 2015-06-15: 150 mg via ORAL
  Filled 2015-06-15: qty 1

## 2015-06-15 MED ORDER — DOCUSATE SODIUM 100 MG PO CAPS
100.0000 mg | ORAL_CAPSULE | Freq: Two times a day (BID) | ORAL | Status: DC
  Administered 2015-06-15: 100 mg via ORAL
  Filled 2015-06-15: qty 1

## 2015-06-15 MED ORDER — TEMAZEPAM 15 MG PO CAPS
15.0000 mg | ORAL_CAPSULE | Freq: Every day | ORAL | Status: DC
  Administered 2015-06-16 – 2015-06-18 (×3): 15 mg via ORAL
  Filled 2015-06-15 (×3): qty 1

## 2015-06-15 MED ORDER — FLUPHENAZINE HCL 5 MG PO TABS
5.0000 mg | ORAL_TABLET | Freq: Three times a day (TID) | ORAL | Status: DC
  Administered 2015-06-16 – 2015-06-19 (×14): 5 mg via ORAL
  Filled 2015-06-15 (×14): qty 1

## 2015-06-15 MED ORDER — AMLODIPINE BESYLATE 5 MG PO TABS
2.5000 mg | ORAL_TABLET | Freq: Every day | ORAL | Status: DC
  Administered 2015-06-16 – 2015-06-19 (×4): 2.5 mg via ORAL
  Filled 2015-06-15 (×5): qty 1

## 2015-06-15 MED ORDER — FLUPHENAZINE HCL 5 MG PO TABS
5.0000 mg | ORAL_TABLET | Freq: Three times a day (TID) | ORAL | Status: DC
  Administered 2015-06-15: 5 mg via ORAL
  Filled 2015-06-15: qty 1

## 2015-06-15 MED ORDER — VENLAFAXINE HCL ER 75 MG PO CP24: 150.0000 mg | ORAL_CAPSULE | Freq: Every day | ORAL | Status: DC

## 2015-06-15 MED ORDER — BENZTROPINE MESYLATE 1 MG PO TABS
1.0000 mg | ORAL_TABLET | Freq: Every day | ORAL | Status: DC
  Administered 2015-06-15: 1 mg via ORAL
  Filled 2015-06-15: qty 1

## 2015-06-15 MED ORDER — AMLODIPINE BESYLATE 5 MG PO TABS
2.5000 mg | ORAL_TABLET | Freq: Every day | ORAL | Status: DC
  Administered 2015-06-15: 2.5 mg via ORAL
  Filled 2015-06-15: qty 1

## 2015-06-15 MED ORDER — LURASIDONE HCL 40 MG PO TABS
80.0000 mg | ORAL_TABLET | Freq: Every day | ORAL | Status: DC
  Administered 2015-06-16 – 2015-06-17 (×2): 80 mg via ORAL
  Filled 2015-06-15 (×2): qty 2

## 2015-06-15 MED ORDER — TEMAZEPAM 15 MG PO CAPS
15.0000 mg | ORAL_CAPSULE | Freq: Every day | ORAL | Status: DC
  Administered 2015-06-15: 15 mg via ORAL
  Filled 2015-06-15: qty 1

## 2015-06-15 MED ORDER — TRAZODONE HCL 50 MG PO TABS
50.0000 mg | ORAL_TABLET | Freq: Every day | ORAL | Status: DC
  Administered 2015-06-16 – 2015-06-18 (×3): 50 mg via ORAL
  Filled 2015-06-15 (×3): qty 1

## 2015-06-15 MED ORDER — LURASIDONE HCL 80 MG PO TABS
80.0000 mg | ORAL_TABLET | Freq: Every day | ORAL | Status: DC
  Administered 2015-06-15: 80 mg via ORAL
  Filled 2015-06-15: qty 1

## 2015-06-15 MED ORDER — ALUM & MAG HYDROXIDE-SIMETH 200-200-20 MG/5ML PO SUSP: 30.0000 mL | ORAL | Status: DC | PRN

## 2015-06-15 MED ORDER — TRAZODONE HCL 50 MG PO TABS
50.0000 mg | ORAL_TABLET | Freq: Every day | ORAL | Status: DC
  Administered 2015-06-15: 50 mg via ORAL
  Filled 2015-06-15: qty 1

## 2015-06-15 NOTE — ED Notes (Signed)
Patient assigned to appropriate care area. Patient oriented to unit/care area: Informed that, for their safety, care areas are designed for safety and monitored by security cameras at all times; and visiting hours explained to patient. Patient verbalizes understanding, and verbal contract for safety obtained. 

## 2015-06-15 NOTE — ED Notes (Signed)
Patient transported to X-ray 

## 2015-06-15 NOTE — ED Notes (Signed)
Pt. Noted in room. No complaints or concerns voiced. No distress or abnormal behavior noted. Will continue to monitor with security cameras. Q 15 minute rounds continue. 

## 2015-06-15 NOTE — ED Notes (Addendum)
Pt sleeping in room at this time. Pt ate all of lunch.

## 2015-06-15 NOTE — Progress Notes (Signed)
LCSW went in briefly to see patient. She has come in  To ED voluntary. She is at a group home Melanie Bell [phone]) She reports she came in because she is hearing voices to swallow things and sometimes see's bloody knives. She stated she is not suicidal or homicidal at this time. She has a guardian Melanie Bell ( mother) [phone] Melanie Bell at [place] home [phone]. She reports that Melanie Bell has removed Prolixon last month and since then the voices are back. She follows with Melanie Bell at [facility].  LCSW will await psychiatrist and consult later.  [employer] LCSW [phone]

## 2015-06-15 NOTE — ED Notes (Signed)
Pt transferred from main ed for psych hold. Pt states that at the group home she began to hear voices telling her to swallow razor blades .She denies that she is sucidal but she still does hear voices,she does contract for safety she says " i am not going to do anything".She has no other c/o

## 2015-06-15 NOTE — ED Provider Notes (Signed)
[facility] Emergency Department Provider Note  ____________________________________________   I have reviewed the triage vital signs and the nursing notes.   HISTORY  Chief Complaint Anxiety    HPI Melanie Bell is a 25 y.o. female who presents today complaining of hallucinations. Patient has a history of schizoaffective disorder. She states here frequently here voices but when she is stressed out they sometimes told to hurt herself. She does have a history of prior self-harm attempts. She states that she has had command hallucinations today and yesterday telling her that she shouldn't swallow something sharp because she is "not worth it". She has no HI. She states she herself does not want to hurt herself and she is concerned about the voices. She states that this happens when she is stressed out sometimes. She denies having taken an overdose or eaten any or consumed any dangerous objects and she does contract not to do so while she is here.  Past Medical History  Diagnosis Date  . Hallucinations 09/30/2014    Sizoaffective  . Depression   . Anxiety   . Hypertension   . Tardive dyskinesia 10/2014    recent onset  . GERD (gastroesophageal reflux disease)   . Hyperlipidemia     Patient Active Problem List   Diagnosis Date Noted  . 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 Schizoaffective disorder, bipolar type ([place]) 11/06/2014  . Tardive dyskinesia 10/06/2014  . Tobacco use disorder 09/30/2014  . H/O foreign body ingestion 09/29/2014  . Hypothyroid 09/29/2014  . Hypertension 09/29/2014  . Constipation 09/29/2014    Past Surgical History  Procedure Laterality Date  . Wisdom tooth extraction    . Esophagogastroduodenoscopy N/A 11/28/2014    Procedure: ESOPHAGOGASTRODUODENOSCOPY (EGD);  Surgeon: [name], MD;  Location: [facility] ENDOSCOPY;  Service: Endoscopy;  Laterality: N/A;  . Abdominal surgery      "years ago" to remove foreign objects   . Breast lumpectomy      Current Outpatient Rx  Name  Route  Sig  Dispense  Refill  . amLODipine (NORVASC) 2.5 MG tablet   Oral   Take 1 tablet (2.5 mg total) by mouth daily.   30 tablet   0   . benztropine (COGENTIN) 1 MG tablet   Oral   Take 1 tablet (1 mg total) by mouth at bedtime.   30 tablet   0   . docusate sodium (COLACE) 100 MG capsule   Oral   Take 2 capsules (200 mg total) by mouth 2 (two) times daily.   120 capsule   0   . fluPHENAZine (PROLIXIN) 10 MG tablet   Oral   Take 1 tablet (10 mg total) by mouth at bedtime.   30 tablet   0   . fluPHENAZine (PROLIXIN) 5 MG tablet   Oral   Take 1 tablet (5 mg total) by mouth daily. At 4 pm   60 tablet   0   . ibuprofen (ADVIL,MOTRIN) 800 MG tablet   Oral   Take 1 tablet (800 mg total) by mouth every 8 (eight) hours as needed.   30 tablet   0   . levothyroxine (SYNTHROID, LEVOTHROID) 75 MCG tablet   Oral   Take 1 tablet (75 mcg total) by mouth daily before breakfast.   30 tablet   0   . lurasidone (LATUDA) 80 MG TABS tablet   Oral   Take 1 tablet (80 mg total) by mouth daily with supper.   30 tablet  0   . OXcarbazepine (TRILEPTAL) 150 MG tablet   Oral   Take 1 tablet (150 mg total) by mouth 2 (two) times daily.   60 tablet   0   . senna (SENOKOT) 8.6 MG TABS tablet   Oral   Take 2 tablets (17.2 mg total) by mouth at bedtime.   60 each   0   . temazepam (RESTORIL) 15 MG capsule   Oral   Take 1 capsule (15 mg total) by mouth at bedtime as needed for sleep.   30 capsule   0   . venlafaxine XR (EFFEXOR-XR) 150 MG 24 hr capsule   Oral   Take 1 capsule (150 mg total) by mouth daily with breakfast.   30 capsule   0     Allergies Betadine; Shellfish-derived products; and Iodine  Family History  Problem Relation Age of Onset  . Depression Mother   . Hypertension Mother     Social History Social History  Substance Use Topics  . Smoking status: Current Every Day Smoker -- 0.50  packs/day for 3 years    Types: Cigarettes  . Smokeless tobacco: Never Used  . Alcohol Use: No    Review of Systems Constitutional: No fever/chills Eyes: No visual changes. ENT: No sore throat. No stiff neck no neck pain Cardiovascular: Denies chest pain. Respiratory: Denies shortness of breath. Gastrointestinal:   no vomiting.  No diarrhea.  No constipation. Genitourinary: Negative for dysuria. Musculoskeletal: Negative lower extremity swelling Skin: Negative for rash. Neurological: Negative for headaches, focal weakness or numbness. 10-point ROS otherwise negative.  ____________________________________________   PHYSICAL EXAM:  VITAL SIGNS: ED Triage Vitals  Enc Vitals Group     BP 06/15/15 0806 145/86 mmHg     Pulse Rate 06/15/15 0806 75     Resp 06/15/15 0806 20     Temp 06/15/15 0806 98.2 F (36.8 C)     Temp Source 06/15/15 0806 Oral     SpO2 06/15/15 0806 99 %     Weight 06/15/15 0806 190 lb (86.183 kg)     Height 06/15/15 0806 5\' 6"  (1.676 m)     Head Cir --      Peak Flow --      Pain Score 06/15/15 0807 0     Pain Loc --      Pain Edu? --      Excl. in [place]? --     Constitutional: Alert and oriented. Well appearing and in no acute distress. Eyes: Conjunctivae are normal. PERRL. EOMI. Head: Atraumatic. Nose: No congestion/rhinnorhea. Mouth/Throat: Mucous membranes are moist.  Oropharynx non-erythematous. Neck: No stridor.   Nontender with no meningismus Cardiovascular: Normal rate, regular rhythm. Grossly normal heart sounds.  Good peripheral circulation. Respiratory: Normal respiratory effort.  No retractions. Lungs CTAB. Abdominal: Soft and nontender. No distention. No guarding no rebound Back:  There is no focal tenderness or step off there is no midline tenderness there are no lesions noted. there is no CVA tenderness Musculoskeletal: No lower extremity tenderness. No joint effusions, no DVT signs strong distal pulses no edema Neurologic:  Normal  speech and language. No gross focal neurologic deficits are appreciated.  Skin:  Skin is warm, dry and intact. No rash noted. Psychiatric: Mood and affect are somewhat bizarre. Speech and behavior are normal.  ____________________________________________   LABS (all labs ordered are listed, but only abnormal results are displayed)  Labs Reviewed  COMPREHENSIVE METABOLIC PANEL - Abnormal; Notable for the following:    Potassium  3.4 (*)    Chloride 114 (*)    Total Bilirubin <0.1 (*)    All other components within normal limits  CBC - Abnormal; Notable for the following:    Hemoglobin 11.7 (*)    HCT 34.7 (*)    All other components within normal limits  ETHANOL  SALICYLATE LEVEL  ACETAMINOPHEN LEVEL  URINE DRUG SCREEN, QUALITATIVE ([facility] ONLY)  POC URINE PREG, ED  POCT PREGNANCY, URINE   ____________________________________________  EKG  I personally interpreted any EKGs ordered by me or triage  ____________________________________________  RADIOLOGY  I reviewed any imaging ordered by me or triage that were performed during my shift ____________________________________________   PROCEDURES  Procedure(s) performed: None  Critical Care performed: None  ____________________________________________   INITIAL IMPRESSION / ASSESSMENT AND PLAN / ED COURSE  Pertinent labs & imaging results that were available during my care of the patient were reviewed by me and considered in my medical decision making (see chart for details).  Patient complains of and hallucinations to harm herself although she states she does not have an inclination actually follow-through. We will keep her here pending psychiatric evaluation. No evidence of actual overdose we will obtain an x-ray to make sure she didn't swallow something radiopaque. ____________________________________________   FINAL CLINICAL IMPRESSION(S) / ED DIAGNOSES  Final diagnoses:  None      This chart was dictated  using voice recognition software.  Despite best efforts to proofread,  errors can occur which can change meaning.     Schuyler Amor, MD 06/15/15 [phone]

## 2015-06-15 NOTE — ED Notes (Signed)
Case worker at pt bedside.

## 2015-06-15 NOTE — ED Notes (Signed)
Report received from [employer]. Pt. Alert and oriented in no distress denies SI, HI, VH and pain. Pt. States she still hears voices without command.  Pt. Instructed to come to me with problems or concerns.Will continue to monitor for safety via security cameras and Q 15 minute checks.

## 2015-06-15 NOTE — Consult Note (Signed)
[place] [facility] Consult   Reason for Consult:  Consult for this 25 year old woman with a history of schizoaffective disorder who came to the emergency room stating she was having thoughts of killing herself Referring Physician:  Burlene Arnt Patient Identification: Melanie Bell MRN:  [id number] Principal Diagnosis: Schizoaffective disorder, bipolar type [facility]) Diagnosis:   Patient Active Problem List   Diagnosis Date Noted  . 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 Schizoaffective disorder, bipolar type ([place]) [F25.0] 11/06/2014  . Tardive dyskinesia [G24.01] 10/06/2014  . Tobacco use disorder [F17.200] 09/30/2014  . H/O foreign body ingestion [Z87.821] 09/29/2014  . Hypothyroid [E03.9] 09/29/2014  . Hypertension [I10] 09/29/2014  . Constipation [K59.00] 09/29/2014    Total Time spent with patient: 1 hour  Subjective:   Melanie Bell is a 25 y.o. female patient admitted with "I don't feel safe at home".  HPI:  Patient interviewed. Chart reviewed. Old notes reviewed. Labs reviewed. This 25 year old woman with a long history of schizoaffective disorder came to the emergency room stating that she was having thoughts of suicide. She tells me that she started having hallucinations yesterday. It had been at least several weeks since she had last been hearing voices. The voices are telling her to kill her self. Her sleep has been poor. Her mood has been anxious and depressed. She has not been eating well. Patient says that her outpatient provider recently changed her medicine by decreasing her dose of Prolixin. She claims that she was switched from 3 times a day to 2 times a day which would mean a total daily decrease of 5 mg of Prolixin. She says it was soon after that that she started to feel worse. Her energy level is been low. He has been having thoughts about hurting herself in the usual way by ingesting some sort of dangerous foreign body but has not acted on it.  She is not drinking or abusing any drugs. She is compliant with her medicine. Recent stresses include that her mother recently moved to [place]. Mother used to live in [place] but moving to [place] puts her youth and farther away. Also patient says she's having some problems getting along with another resident at the group home.  Medical history: History of hypothyroidism mild high blood pressure. She has had several admissions in the past for foreign body ingestion which have required occasional surgery but usually have been managed conservatively. Fortunately she doesn't have any permanent injury from it.  Social history: Patient lives in a group home. She's been there for a while. As usual she is conflicted about it. A big issue has always been that she wishes she could live with her mother but can't and that makes her upset.  Substance abuse history: Patient denies alcohol or drug abuse and does not have a major past history of substance problems.  Past Psychiatric History: Patient has had multiple psychiatric admissions and multiple visits to the emergency room. In the past she was in the bad habit of swallowing dangerous foreign bodies such as open safety pins. He appears to be getting better at refraining from doing that. She has been fairly stable on a combination of antipsychotics and mood stabilizers.  Risk to Self: Suicidal Ideation: No Suicidal Intent: Yes-Currently Present Is patient at risk for suicide?: Yes Suicidal Plan?: Yes-Currently Present (Command hallucinations ) Specify Current Suicidal Plan: Swallow sharp objects  Access to Means: Yes Specify Access to Suicidal Means: In the [place] What has been your use  of drugs/alcohol within the last 12 months?: None Report How many times?: 10 Other Self Harm Risks: History of cutting Triggers for Past Attempts: Hallucinations, Family contact, Other personal contacts Intentional Self Injurious Behavior: Cutting Comment - Self  Injurious Behavior: Hasn't cut in over 5 years Risk to Others: Homicidal Ideation: No Thoughts of Harm to Others: No Current Homicidal Intent: No Current Homicidal Plan: No Access to Homicidal Means: No Identified Victim: None Reported History of harm to others?: No Assessment of Violence: None Noted Violent Behavior Description: None Repoted Does patient have access to weapons?: No Criminal Charges Pending?: No Does patient have a court date: No Prior Inpatient Therapy: Prior Inpatient Therapy: Yes Prior Therapy Dates: 07/16, 06/16, 04/16, 04/15 & 08/14 Prior Therapy Facilty/Provider(s): [facility], [place], [place] Reason for Treatment: Schizophrenia Prior Outpatient Therapy: Prior Outpatient Therapy: Yes Prior Therapy Dates: Current' Prior Therapy Facilty/Provider(s): [facility] Reason for Treatment: Schizophrenia  Does patient have an ACCT team?: No Does patient have Intensive In-House Services?  : No Does patient have [facility] services? : No Does patient have P4CC services?: No  Past Medical History:  Past Medical History  Diagnosis Date  . Hallucinations 09/30/2014    Sizoaffective  . Depression   . Anxiety   . Hypertension   . Tardive dyskinesia 10/2014    recent onset  . GERD (gastroesophageal reflux disease)   . Hyperlipidemia     Past Surgical History  Procedure Laterality Date  . Wisdom tooth extraction    . Esophagogastroduodenoscopy N/A 11/28/2014    Procedure: ESOPHAGOGASTRODUODENOSCOPY (EGD);  Surgeon: [name], MD;  Location: [facility] ENDOSCOPY;  Service: Endoscopy;  Laterality: N/A;  . Abdominal surgery      "years ago" to remove foreign objects  . Breast lumpectomy     Family History:  Family History  Problem Relation Age of Onset  . Depression Mother   . Hypertension Mother    Family Psychiatric  History: Patient reports that her father had schizophrenia and her mother is had depression. Social History:  History  Alcohol Use No      History  Drug Use No    Social History   Social History  . Marital Status: Single    Spouse Name: N/A  . Number of Children: N/A  . Years of Education: N/A   Social History Main Topics  . Smoking status: Current Every Day Smoker -- 0.50 packs/day for 3 years    Types: Cigarettes  . Smokeless tobacco: Never Used  . Alcohol Use: No  . Drug Use: No  . Sexual Activity: Not Asked   Other Topics Concern  . None   Social History Narrative   Additional Social History:    Pain Medications: See PTA Prescriptions: See PTA Over the Counter: See PTA History of alcohol / drug use?: No history of alcohol / drug abuse Longest period of sobriety (when/how long): No history of use Negative Consequences of Use:  (No history of use) Withdrawal Symptoms:  (No history of use)                     Allergies:   Allergies  Allergen Reactions  . Betadine [Povidone Iodine] Other (See Comments)    Reaction:  Unknown   . Shellfish-Derived Products Other (See Comments)    Reaction:  Unknown   . Iodine Rash    Labs:  Results for orders placed or performed during the hospital encounter of 06/15/15 (from the past 48 hour(s))  Comprehensive metabolic panel     Status: Abnormal   Collection Time: 06/15/15  8:09 AM  Result Value Ref Range   Sodium 141 135 - 145 mmol/L   Potassium 3.4 (L) 3.5 - 5.1 mmol/L   Chloride 114 (H) 101 - 111 mmol/L   CO2 22 22 - 32 mmol/L   Glucose, Bld 97 65 - 99 mg/dL   BUN 8 6 - 20 mg/dL   Creatinine, Ser 0.68 0.44 - 1.00 mg/dL   Calcium 9.2 8.9 - 10.3 mg/dL   Total Protein 6.6 6.5 - 8.1 g/dL   Albumin 3.9 3.5 - 5.0 g/dL   AST 18 15 - 41 U/L   ALT 15 14 - 54 U/L   Alkaline Phosphatase 67 38 - 126 U/L   Total Bilirubin <0.1 (L) 0.3 - 1.2 mg/dL   GFR calc non Af Amer >60 >60 mL/min   GFR calc Af Amer >60 >60 mL/min    Comment: (NOTE) The eGFR has been calculated using the CKD EPI equation. This calculation has not been validated in all clinical  situations. eGFR's persistently <60 mL/min signify possible Chronic Kidney Disease.    Anion gap 5 5 - 15  Ethanol (ETOH)     Status: None   Collection Time: 06/15/15  8:09 AM  Result Value Ref Range   Alcohol, Ethyl (B) <5 <5 mg/dL    Comment:        LOWEST DETECTABLE LIMIT FOR SERUM ALCOHOL IS 5 mg/dL FOR MEDICAL PURPOSES ONLY   Salicylate level     Status: None   Collection Time: 06/15/15  8:09 AM  Result Value Ref Range   Salicylate Lvl <3.1 2.8 - 30.0 mg/dL  Acetaminophen level     Status: Abnormal   Collection Time: 06/15/15  8:09 AM  Result Value Ref Range   Acetaminophen (Tylenol), Serum <10 (L) 10 - 30 ug/mL    Comment:        THERAPEUTIC CONCENTRATIONS VARY SIGNIFICANTLY. A RANGE OF 10-30 ug/mL MAY BE AN EFFECTIVE CONCENTRATION FOR MANY PATIENTS. HOWEVER, SOME ARE BEST TREATED AT CONCENTRATIONS OUTSIDE THIS RANGE. ACETAMINOPHEN CONCENTRATIONS >150 ug/mL AT 4 HOURS AFTER INGESTION AND >50 ug/mL AT 12 HOURS AFTER INGESTION ARE OFTEN ASSOCIATED WITH TOXIC REACTIONS.   CBC     Status: Abnormal   Collection Time: 06/15/15  8:09 AM  Result Value Ref Range   WBC 4.8 3.6 - 11.0 K/uL   RBC 3.89 3.80 - 5.20 MIL/uL   Hemoglobin 11.7 (L) 12.0 - 16.0 g/dL   HCT 34.7 (L) 35.0 - 47.0 %   MCV 89.1 80.0 - 100.0 fL   MCH 30.2 26.0 - 34.0 pg   MCHC 33.9 32.0 - 36.0 g/dL   RDW 13.9 11.5 - 14.5 %   Platelets 199 150 - 440 K/uL  Urine Drug Screen, Qualitative ([facility] only)     Status: None   Collection Time: 06/15/15  8:09 AM  Result Value Ref Range   Tricyclic, Ur Screen NONE DETECTED NONE DETECTED   Amphetamines, Ur Screen NONE DETECTED NONE DETECTED   MDMA (Ecstasy)Ur Screen NONE DETECTED NONE DETECTED   Cocaine Metabolite,Ur [place] NONE DETECTED NONE DETECTED   Opiate, Ur Screen NONE DETECTED NONE DETECTED   Phencyclidine (PCP) Ur S NONE DETECTED NONE DETECTED   Cannabinoid 50 Ng, Ur [place] NONE DETECTED NONE DETECTED   Barbiturates, Ur Screen NONE DETECTED NONE DETECTED    Benzodiazepine, Ur Scrn NONE DETECTED NONE DETECTED   Methadone Scn, Ur NONE DETECTED NONE  DETECTED    Comment: (NOTE) 542  Tricyclics, urine               Cutoff 1000 ng/mL 200  Amphetamines, urine             Cutoff 1000 ng/mL 300  MDMA (Ecstasy), urine           Cutoff 500 ng/mL 400  Cocaine Metabolite, urine       Cutoff 300 ng/mL 500  Opiate, urine                   Cutoff 300 ng/mL 600  Phencyclidine (PCP), urine      Cutoff 25 ng/mL 700  Cannabinoid, urine              Cutoff 50 ng/mL 800  Barbiturates, urine             Cutoff 200 ng/mL 900  Benzodiazepine, urine           Cutoff 200 ng/mL 1000 Methadone, urine                Cutoff 300 ng/mL 1100 1200 The urine drug screen provides only a preliminary, unconfirmed 1300 analytical test result and should not be used for non-medical 1400 purposes. Clinical consideration and professional judgment should 1500 be applied to any positive drug screen result due to possible 1600 interfering substances. A more specific alternate chemical method 1700 must be used in order to obtain a confirmed analytical result.  1800 Gas chromato graphy / mass spectrometry (GC/MS) is the preferred 1900 confirmatory method.   Pregnancy, urine POC     Status: None   Collection Time: 06/15/15  8:15 AM  Result Value Ref Range   Preg Test, Ur NEGATIVE NEGATIVE    Comment:        THE SENSITIVITY OF THIS METHODOLOGY IS >24 mIU/mL     Current Facility-Administered Medications  Medication Dose Route Frequency Provider Last Rate Last Dose  . amLODipine (NORVASC) tablet 2.5 mg  2.5 mg Oral Daily [name], MD      . benztropine (COGENTIN) tablet 1 mg  1 mg Oral QHS Fern Canova T Hodaya Curto, MD      . docusate sodium (COLACE) capsule 100 mg  100 mg Oral BID [name], MD      . fluPHENAZine (PROLIXIN) tablet 5 mg  5 mg Oral TID AC & HS [name], MD      . Derrill Memo ON 06/16/2015] levothyroxine (SYNTHROID, LEVOTHROID) tablet 75 mcg  75 mcg Oral QAC  breakfast [name], MD      . lurasidone (LATUDA) tablet 80 mg  80 mg Oral Q supper [name], MD      . Oxcarbazepine (TRILEPTAL) tablet 150 mg  150 mg Oral BID [name], MD      . temazepam (RESTORIL) capsule 15 mg  15 mg Oral QHS [name], MD      . traZODone (DESYREL) tablet 50 mg  50 mg Oral QHS [name], MD      . Derrill Memo ON 06/16/2015] venlafaxine XR (EFFEXOR-XR) 24 hr capsule 150 mg  150 mg Oral Q breakfast [name], MD       Current Outpatient Prescriptions  Medication Sig Dispense Refill  . amLODipine (NORVASC) 2.5 MG tablet Take 1 tablet (2.5 mg total) by mouth daily. 30 tablet 0  . benztropine (COGENTIN) 1 MG tablet Take 1 tablet (1 mg total) by mouth  at bedtime. 30 tablet 0  . docusate sodium (COLACE) 100 MG capsule Take 2 capsules (200 mg total) by mouth 2 (two) times daily. 120 capsule 0  . fluPHENAZine (PROLIXIN) 10 MG tablet Take 1 tablet (10 mg total) by mouth at bedtime. 30 tablet 0  . levothyroxine (SYNTHROID, LEVOTHROID) 75 MCG tablet Take 1 tablet (75 mcg total) by mouth daily before breakfast. 30 tablet 0  . lurasidone (LATUDA) 80 MG TABS tablet Take 1 tablet (80 mg total) by mouth daily with supper. 30 tablet 0  . OXcarbazepine (TRILEPTAL) 150 MG tablet Take 1 tablet (150 mg total) by mouth 2 (two) times daily. 60 tablet 0  . temazepam (RESTORIL) 15 MG capsule Take 1 capsule (15 mg total) by mouth at bedtime as needed for sleep. 30 capsule 0  . traZODone (DESYREL) 50 MG tablet Take 50 mg by mouth at bedtime.    Marland Kitchen venlafaxine XR (EFFEXOR-XR) 150 MG 24 hr capsule Take 1 capsule (150 mg total) by mouth daily with breakfast. 30 capsule 0  . fluPHENAZine (PROLIXIN) 5 MG tablet Take 1 tablet (5 mg total) by mouth daily. At 4 pm (Patient not taking: Reported on 06/15/2015) 60 tablet 0  . ibuprofen (ADVIL,MOTRIN) 800 MG tablet Take 1 tablet (800 mg total) by mouth every 8 (eight) hours as needed. (Patient not taking: Reported on 06/15/2015) 30 tablet  0  . senna (SENOKOT) 8.6 MG TABS tablet Take 2 tablets (17.2 mg total) by mouth at bedtime. (Patient not taking: Reported on 06/15/2015) 60 each 0    Musculoskeletal: Strength & Muscle Tone: within normal limits Gait & Station: normal Patient leans: N/A  Psychiatric Specialty Exam: Review of Systems  Constitutional: Negative.   HENT: Negative.   Eyes: Negative.   Respiratory: Negative.   Cardiovascular: Negative.   Gastrointestinal: Negative.   Musculoskeletal: Negative.   Skin: Negative.   Neurological: Negative.   Psychiatric/Behavioral: Positive for depression, suicidal ideas and hallucinations. Negative for memory loss and substance abuse. The patient is nervous/anxious and has insomnia.     Blood pressure 145/86, pulse 75, temperature 98.2 F (36.8 C), temperature source Oral, resp. rate 20, height 5' 6"  (1.676 m), weight 86.183 kg (190 lb), last menstrual period 05/04/2015, SpO2 99 %.Body mass index is 30.68 kg/(m^2).  General Appearance: Fairly Groomed  Engineer, water::  Fair  Speech:  Slow  Volume:  Normal  Mood:  Dysphoric  Affect:  Constricted  Thought Process:  Logical  Orientation:  Full (Time, Place, and Person)  Thought Content:  Hallucinations: Auditory  Suicidal Thoughts:  Yes.  with intent/plan  Homicidal Thoughts:  No  Memory:  Immediate;   Fair Recent;   Fair Remote;   Fair  Judgement:  Fair  Insight:  Fair  Psychomotor Activity:  Decreased  Concentration:  Fair  Recall:  [employer] of Knowledge:Fair  Language: Fair  Akathisia:  No  Handed:  Right  AIMS (if indicated):     Assets:  Communication Skills Desire for Improvement Financial Resources/Insurance Housing Resilience Social Support  ADL's:  Intact  Cognition: WNL  Sleep:      Treatment Plan Summary: Daily contact with patient to assess and evaluate symptoms and progress in treatment, Medication management and Plan 25 year old woman with a history of schizoaffective disorder who has a  positive past history of suicide attempts who is presenting to the emergency room stating she has suicidal ideation. She also says she is having hallucinations and that they are ongoing. As not acted out  on it I would take her at her word that she is at increased risk of acting out and she certainly has been a danger to herself in the past. I think the wisest thing is to go ahead and admit her to the hospital for stabilization. I will increase the Prolixin backup to the equivalent dose she was getting previously. I understand the dosing she used to get 5 mg in the morning 5 mg midday and 10 mg at night. I'm going to switch it to just 5 mg 4 times a day. Continue other medicines as prescribed. 15 minute checks in place because of suicidal ideation. Continue medicines for hypothyroidism. Check her thyroid her prolactin her hemoglobin A1c and lipid panel.  Disposition: Recommend psychiatric Inpatient admission when medically cleared.  Addiel Mccardle 06/15/2015 5:43 PM

## 2015-06-15 NOTE — ED Notes (Signed)
Pt given blanket and pillow and ask for drink. I informed pt she could have drink with breakfast.

## 2015-06-15 NOTE — ED Notes (Signed)
Pt. Noted sleeping in room. No complaints or concerns voiced. No distress or abnormal behavior noted. Will continue to monitor with security cameras. Q 15 minute rounds continue. 

## 2015-06-15 NOTE — ED Notes (Signed)
Pt given breakfast tray
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
## 2015-06-15 NOTE — ED Notes (Signed)
Pt to ed with c/o hearing voices and feeling anxious.  Pt states she started hearing them yesterday,  Pt states they say to kill herself.  Pt denies HI.  Pt appears in nad.  Per the group home caregiver pt is only here because she wants to drink soda all day.  Caregiver reports she was here recently and told her that she gets to drink soda all day.

## 2015-06-16 ENCOUNTER — Encounter: Payer: Self-pay | Admitting: Psychiatry

## 2015-06-16 DIAGNOSIS — F250 Schizoaffective disorder, bipolar type: Principal | ICD-10-CM

## 2015-06-16 LAB — LIPID PANEL
CHOLESTEROL: 148 mg/dL (ref 0–200)
HDL: 37 mg/dL — ABNORMAL LOW (ref >40)
LDL Cholesterol: 95 mg/dL (ref 0–99)
TRIGLYCERIDES: 79 mg/dL (ref <150)
Total CHOL/HDL Ratio: 4 RATIO
VLDL: 16 mg/dL (ref 0–40)

## 2015-06-16 LAB — TSH: TSH: 0.774 u[IU]/mL (ref 0.350–4.500)

## 2015-06-16 LAB — HEMOGLOBIN A1C: Hgb A1c MFr Bld: 5.2 % (ref 4.0–6.0)

## 2015-06-16 MED ORDER — NICOTINE 21 MG/24HR TD PT24
21.0000 mg | MEDICATED_PATCH | Freq: Every day | TRANSDERMAL | Status: DC
  Administered 2015-06-16 – 2015-06-19 (×4): 21 mg via TRANSDERMAL
  Filled 2015-06-16 (×2): qty 1

## 2015-06-16 NOTE — Tx Team (Signed)
Initial Interdisciplinary Treatment Plan   PATIENT STRESSORS: Medication change or noncompliance   PATIENT STRENGTHS: General fund of knowledge Supportive family/friends   PROBLEM LIST: Problem List/Patient Goals Date to be addressed Date deferred Reason deferred Estimated date of resolution  Suicidal Ideations 06/15/15     Auditory Hallucinations 06/15/15                                                DISCHARGE CRITERIA:  Improved stabilization in mood, thinking, and/or behavior  PRELIMINARY DISCHARGE PLAN: Return to previous living arrangement  PATIENT/FAMIILY INVOLVEMENT: This treatment plan has been presented to and reviewed with the patient, Melanie Bell, and/or family member.  The patient and family have been given the opportunity to ask questions and make suggestions.  Derrill Kay 06/16/2015, 12:49 AM

## 2015-06-16 NOTE — H&P (Addendum)
Psychiatric Admission Assessment Adult  Patient Identification: Melanie Bell MRN:  [id number] Date of Evaluation:  06/16/2015 Chief Complaint:  Schizophrenia Principal Diagnosis: Schizoaffective disorder, bipolar type ([place]) Diagnosis:   Patient Active Problem List   Diagnosis Date Noted  . Suicidal ideation [R45.851] 06/15/2015  . Schizoaffective disorder, bipolar type ([place]) [F25.0] 11/06/2014  . Tardive dyskinesia [G24.01] 10/06/2014  . Tobacco use disorder [F17.200] 09/30/2014  . Hypothyroidism [E03.9] 09/29/2014  . Hypertension [I10] 09/29/2014  . Constipation [K59.00] 09/29/2014   History of Present Illness:  Identifying data. [name] is a 25 year old female with history of schizoaffective disorder.  Chief complaint. "I have voices."  History of present illness. Information was obtained from the patient and the chart. [name] has a long history of depression psychosis and mood instability. She is well known to [name] and has been hospitalized at [facility] on multiple occasions. is ability. She has been relatively stable on a combination of Prolixin, Latuda and Trileptal. During her last visit to her psychiatrist Prolixin was lowered to twice a day from 3 times a day. The patient started experiencing auditory hallucinations telling her to kill herself. She came to the hospital for help. She denies symptoms of depression or severe anxiety.  her suicidal thinking is precipitated by the voices. She is unable to identify any stressors other than change of medication regimen. Her father however who is the guardian relocated from [place] to [place] now. It is likely that the patient has very limited contact with her mother now. She denies symptoms suggestive of bipolar mania. There is no alcohol or drugs involved.   Past psychiatric history. The patient has been hospitalized numerous times for mood instability, suicidal thinking and swallowing of foreign objects.  Recently she seems to develop better coping skills and has been able to come to the hospital before she actually swallows unfavorable objects. She has been tried on multiple medications. Seems that the current regimen keeps her stable.   Family psychiatric history. Mother with depression.   Social history. She is incompetent adult. She lives in a group home. Her home life was disrupted when her mother divorced her stepfather. She lost contact with her step grandmother who was very important to her. Her mother has been experiencing some family issues. This worries and the patient a lot.   Total Time spent with patient: 1 hour  Past Psychiatric History: Schizoaffective disorder.   Risk to Self: Is patient at risk for suicide?: Yes Risk to Others:   Prior Inpatient Therapy:   Prior Outpatient Therapy:    Alcohol Screening: 1. How often do you have a drink containing alcohol?: Never 9. Have you or someone else been injured as a result of your drinking?: No 10. Has a relative or friend or a doctor or another health worker been concerned about your drinking or suggested you cut down?: No Alcohol Use Disorder Identification Test Final Score (AUDIT): 0 Brief Intervention: AUDIT score less than 7 or less-screening does not suggest unhealthy drinking-brief intervention not indicated Substance Abuse History in the last 12 months:  No. Consequences of Substance Abuse: NA Previous Psychotropic Medications: Yes  Psychological Evaluations: No  Past Medical History:  Past Medical History  Diagnosis Date  . Hallucinations 09/30/2014    Sizoaffective  . Depression   . Anxiety   . Hypertension   . Tardive dyskinesia 10/2014    recent onset  . GERD (gastroesophageal reflux disease)   . Hyperlipidemia     Past Surgical History  Procedure Laterality Date  . Wisdom tooth extraction    . Esophagogastroduodenoscopy N/A 11/28/2014    Procedure: ESOPHAGOGASTRODUODENOSCOPY (EGD);  Surgeon: [name], MD;  Location: [facility] ENDOSCOPY;  Service: Endoscopy;  Laterality: N/A;  . Abdominal surgery      "years ago" to remove foreign objects  . Breast lumpectomy     Family History:  Family History  Problem Relation Age of Onset  . Depression Mother   . Hypertension Mother    Family Psychiatric  History: Mother with depression.  Social History:  History  Alcohol Use No     History  Drug Use No    Social History   Social History  . Marital Status: Single    Spouse Name: N/A  . Number of Children: N/A  . Years of Education: N/A   Social History Main Topics  . Smoking status: Current Every Day Smoker -- 0.50 packs/day for 3 years    Types: Cigarettes  . Smokeless tobacco: Never Used  . Alcohol Use: No  . Drug Use: No  . Sexual Activity: No   Other Topics Concern  . None   Social History Narrative   Additional Social History:                         Allergies:   Allergies  Allergen Reactions  . Betadine [Povidone Iodine] Other (See Comments)    Reaction:  Unknown   . Shellfish-Derived Products Other (See Comments)    Reaction:  Unknown   . Iodine Rash   Lab Results:  Results for orders placed or performed during the hospital encounter of 06/15/15 (from the past 48 hour(s))  Lipid panel, fasting     Status: Abnormal   Collection Time: 06/16/15  8:35 AM  Result Value Ref Range   Cholesterol 148 0 - 200 mg/dL   Triglycerides 79 <150 mg/dL   HDL 37 (L) >40 mg/dL   Total CHOL/HDL Ratio 4.0 RATIO   VLDL 16 0 - 40 mg/dL   LDL Cholesterol 95 0 - 99 mg/dL    Comment:        Total Cholesterol/HDL:CHD Risk Coronary Heart Disease Risk Table                     Men   Women  1/2 Average Risk   3.4   3.3  Average Risk       5.0   4.4  2 X Average Risk   9.6   7.1  3 X Average Risk  23.4   11.0        Use the calculated Patient Ratio above and the CHD Risk Table to determine the patient's CHD Risk.        ATP III CLASSIFICATION (LDL):  <100      mg/dL   Optimal  100-129  mg/dL   Near or Above                    Optimal  130-159  mg/dL   Borderline  160-189  mg/dL   High  >190     mg/dL   Very High   TSH     Status: None   Collection Time: 06/16/15  8:35 AM  Result Value Ref Range   TSH 0.774 0.350 - 4.500 uIU/mL    Metabolic Disorder Labs:  Lab Results  Component Value Date   HGBA1C 5.0 11/06/2014   No results found for:  PROLACTIN Lab Results  Component Value Date   CHOL 148 06/16/2015   TRIG 79 06/16/2015   HDL 37* 06/16/2015   CHOLHDL 4.0 06/16/2015   VLDL 16 06/16/2015   LDLCALC 95 06/16/2015   LDLCALC 114* 11/06/2014    Current Medications: Current Facility-Administered Medications  Medication Dose Route Frequency Provider Last Rate Last Dose  . acetaminophen (TYLENOL) tablet 650 mg  650 mg Oral Q6H PRN [name], MD      . alum & mag hydroxide-simeth (MAALOX/MYLANTA) 200-200-20 MG/5ML suspension 30 mL  30 mL Oral Q4H PRN [name], MD      . amLODipine (NORVASC) tablet 2.5 mg  2.5 mg Oral Daily [name], MD   2.5 mg at 06/16/15 0930  . benztropine (COGENTIN) tablet 1 mg  1 mg Oral QHS [name], MD      . docusate sodium (COLACE) capsule 100 mg  100 mg Oral BID [name], MD   100 mg at 06/16/15 0930  . fluPHENAZine (PROLIXIN) tablet 5 mg  5 mg Oral TID AC & HS [name], MD   5 mg at 06/16/15 0931  . levothyroxine (SYNTHROID, LEVOTHROID) tablet 75 mcg  75 mcg Oral QAC breakfast [name], MD   75 mcg at 06/16/15 0729  . lurasidone (LATUDA) tablet 80 mg  80 mg Oral Q supper [name], MD      . magnesium hydroxide (MILK OF MAGNESIA) suspension 30 mL  30 mL Oral Daily PRN [name], MD      . nicotine (NICODERM CQ - dosed in mg/24 hours) patch 21 mg  21 mg Transdermal Daily Elza Sortor B Shireen Rayburn, MD   21 mg at 06/16/15 0930  . Oxcarbazepine (TRILEPTAL) tablet 150 mg  150 mg Oral BID [name], MD   150 mg at 06/16/15 0934  . temazepam (RESTORIL) capsule 15 mg   15 mg Oral QHS [name], MD      . traZODone (DESYREL) tablet 50 mg  50 mg Oral QHS [name], MD      . venlafaxine XR (EFFEXOR-XR) 24 hr capsule 150 mg  150 mg Oral Q breakfast [name], MD   150 mg at 06/16/15 0930   PTA Medications: Prescriptions prior to admission  Medication Sig Dispense Refill Last Dose  . amLODipine (NORVASC) 2.5 MG tablet Take 1 tablet (2.5 mg total) by mouth daily. 30 tablet 0 06/15/2015 at 0800  . benztropine (COGENTIN) 1 MG tablet Take 1 tablet (1 mg total) by mouth at bedtime. 30 tablet 0 06/14/2015 at 2000  . docusate sodium (COLACE) 100 MG capsule Take 2 capsules (200 mg total) by mouth 2 (two) times daily. 120 capsule 0 06/15/2015 at 0800  . fluPHENAZine (PROLIXIN) 10 MG tablet Take 1 tablet (10 mg total) by mouth at bedtime. 30 tablet 0 06/14/2015 at 2000  . ibuprofen (ADVIL,MOTRIN) 800 MG tablet Take 1 tablet (800 mg total) by mouth every 8 (eight) hours as needed. (Patient not taking: Reported on 06/15/2015) 30 tablet 0   . levothyroxine (SYNTHROID, LEVOTHROID) 75 MCG tablet Take 1 tablet (75 mcg total) by mouth daily before breakfast. 30 tablet 0 06/15/2015 at 0800  . lurasidone (LATUDA) 80 MG TABS tablet Take 1 tablet (80 mg total) by mouth daily with supper. 30 tablet 0 06/14/2015 at 1700  . OXcarbazepine (TRILEPTAL) 150 MG tablet Take 1 tablet (150 mg total) by mouth 2 (two) times daily. [place]  tablet 0 06/15/2015 at 0800  . senna (SENOKOT) 8.6 MG TABS tablet Take 2 tablets (17.2 mg total) by mouth at bedtime. (Patient not taking: Reported on 06/15/2015) 60 each 0 11/26/2014 at Unknown time  . temazepam (RESTORIL) 15 MG capsule Take 1 capsule (15 mg total) by mouth at bedtime as needed for sleep. 30 capsule 0 06/14/2015 at 2000  . traZODone (DESYREL) 50 MG tablet Take 50 mg by mouth at bedtime.   06/14/2015 at 2000  . venlafaxine XR (EFFEXOR-XR) 150 MG 24 hr capsule Take 1 capsule (150 mg total) by mouth daily with breakfast. 30 capsule 0 06/15/2015 at 0800     Musculoskeletal: Strength & Muscle Tone: within normal limits Gait & Station: normal Patient leans: N/A  Psychiatric Specialty Exam: I reviewed physical exam performed in the emergency room and agree with the findings. Physical Exam  Nursing note and vitals reviewed.   Review of Systems  Psychiatric/Behavioral: Positive for hallucinations.  All other systems reviewed and are negative.   Blood pressure 114/69, pulse 71, temperature 98.8 F (37.1 C), temperature source Oral, resp. rate 16, height 5' 8.31" (1.735 m), weight 87.091 kg (192 lb), last menstrual period 05/04/2015, SpO2 100 %.Body mass index is 28.93 kg/(m^2).  See SRA.                                                  Sleep:  Number of Hours: 4.25     Treatment Plan Summary: Daily contact with patient to assess and evaluate symptoms and progress in treatment and Medication management   [name] is a 25 year old female with history of schizoaffective disorder admitted for worsening of depression, auditory hallucinations, and suicidal ideation in the context of recent medication adjustments.  1. Suicidal ideation. The patient  is still suicidal and unable to contract for safety. Surveyor, quantity.  2. Mood and psychosis. We will continue Prolixin at the higher dose of 50 mg daily for psychosis, Lamictal and Trileptal for mood stabilization, and Effexor for depression.  3. Hypertension. She is on amlodipine.  4. Constipation. She is on Colace.  5. Hypothyroidism. She is on Synthroid.  6. Smoking. Nicotine patch is available.  7. Insomnia. She is on Restoril and trazodone.  8. Social. She is incompetent adult. Her father is the guardian. She recently moved to [place].  9. Metabolic syndrome. Lipid panel, hemoglobin A1c, TSH, prolactin are pending.   10. Abdominal x-ray is unremarkable.  11. Disposition. She will be discharged back to her group home. She will follow up with her primary  psychiatrist at [facility].  Observation Level/Precautions:  1 to 1 15 minute checks  Laboratory:  CBC Chemistry Profile UDS UA  Psychotherapy:    Medications:    Consultations:    Discharge Concerns:    Estimated LOS:  Other:     I certify that inpatient services furnished can reasonably be expected to improve the patient's condition.   Omunique Pederson 1/31/201711:16 AM

## 2015-06-16 NOTE — Progress Notes (Signed)
Recreation Therapy Notes  Date: 01.31.17 Time: 3:00 pm Location: Craft Room  Group Topic: Goal Setting  Goal Area(s) Addresses:  Patient will write at least one goal. Patient will write at least one obstacle.  Behavioral Response: Attentive, Interactive  Intervention: Recovery Goal Chart  Activity: Patients were instructed to make a Recovery Goal Chart including goals, obstacles, the date they started working on their goals, and the date they achieved their goals.  Education: LRT educated patients on ways they can celebrate reaching their goals.  Education Outcome: Acknowledges education/In group clarification offered  Clinical Observations/Feedback: Patient completed activity by writing goals and obstacles. Patient contributed to group discussion by stating how to keep herself focused on her goals, that she had people to help keep herself focused on her goals, and how to celebrate reaching her goals in a healthy way.         Leonette Monarch 06/16/2015 4:30 PM

## 2015-06-16 NOTE — Plan of Care (Signed)
Problem: Consults Goal: Suicide Risk Patient Education (See Patient Education module for education specifics)  Outcome: Progressing No SI/HI/SH at this time.

## 2015-06-16 NOTE — Progress Notes (Signed)
Patient ID: Melanie Bell, female   DOB: [birth year], 25 y.o.   MRN: [name] Patient admitted voluntarily from [facility] after having SI. She has auditory hallucinations that tell her to hurt herself and tell her she shouldn't be alive. She stated she would try to hurt herself by ingesting something. She has had to have materials removed before from ingesting them. She has thoughts of swallowing razor blades and other sharp objects. She isn't currently having any of those thoughts. She currently denies SI/HI/AVH. In report the RN said she had stated there was a recent change in her meds which could be causing the issues. She has social support from her mother and she states she gets along with her very well. She currently resides at a group home and they provide her transportation. Skin search was done with other RN present. No contraband found. Scar on abdomen from sugary. Patient calm and cooperative. Safety maintained with 15 min checks.

## 2015-06-16 NOTE — Tx Team (Signed)
Interdisciplinary Treatment Plan Update (Adult)  Date:  06/16/2015 Time Reviewed:  4:14 PM  Progress in Treatment: Attending groups: Yes. Participating in groups:  Yes. Taking medication as prescribed:  Yes. Tolerating medication:  Yes. Family/Significant othe contact made:  No, will contact:  if patient provides consent Patient understands diagnosis:  Yes. Discussing patient identified problems/goals with staff:  Yes. Medical problems stabilized or resolved:  Yes. Denies suicidal/homicidal ideation: Yes. Issues/concerns per patient self-inventory:  Yes. Other:  New problem(s) identified: No, Describe:  none reported  Discharge Plan or Barriers:Patient will stabilize on medication and discharge to her group home. Patient will follow up with her outpatient provider at discharge.  Reason for Continuation of Hospitalization: Depression Hallucinations Medication stabilization Suicidal ideation  Comments:  Estimated length of stay: 3 days expected to discharge Friday 06/19/15  New goal(s):  Review of initial/current patient goals per problem list:   1.  Goal(s):Participate in aftercare plan  Met:  Yes  Target date:at discharge  As evidenced by: patient will participate in aftercare plan AEB aftercare provider and housing plan identified at discharge  06/16/15: Patient will return to group home and has follow up  2.  Goal (s):Decrease depression  Met:  No  Target date:at discharge  As evidenced OR:VIFBPPH demonstrates decreased symptoms of depression and reports a Depression rating of 3 or less  06/16/15: patient denies SI but cannot give depression rating of 3 or less 3.  Goal(s): Decrease psychosis   Met:  No  Target date:by discharge  As evidenced [id number] demonstrates decreased symptoms of psychosis 06/16/15: patient reports [place]   Attendees: Physician:  Orson Slick, MD 1/31/20174:14 PM  Nursing:   Carolynn Sayers, RN 1/31/20174:14 PM  Other:  Carmell Austria, [place] 1/31/20174:14 PM  Other:  Everitt Amber, [place] 1/31/20174:14 PM  Other:   1/31/20174:14 PM  Other:  1/31/20174:14 PM  Other:  1/31/20174:14 PM  Other:  1/31/20174:14 PM  Other:  1/31/20174:14 PM  Other:  1/31/20174:14 PM  Other:  1/31/20174:14 PM  Other:   1/31/20174:14 PM   Scribe for Treatment Team:   Keene Breath, MSW, [place] [phone]  06/16/2015, 4:14 PM

## 2015-06-16 NOTE — Progress Notes (Signed)
Recreation Therapy Notes  INPATIENT RECREATION THERAPY ASSESSMENT  Patient Details [name] MRN: [name] DOB: 06-07-1990 Today's Date: 06/16/2015  Patient Stressors: Family, Relationship, Friends (Mom moved to [place] about a month ago, but didn't tell pt until recently; found sister after she had been missing for 2 years and the sister does not want anything to do with the family; recently break-up; lack of supportive friends)  Coping Skills:   Isolate, Avoidance, Art/Dance, Talking, Music, Other (Comment) (Journaling, reading the Bible)  Personal Challenges: Communication, Concentration, Decision-Making, Expressing Yourself, Relationships, Stress Management, Time Management, Trusting Others  Leisure Interests (2+):  Individual - Other (Comment) (Art and reading)  Awareness of Community Resources:  Yes  Community Resources:  Other (Comment), [employer]  Current Use: Yes  If no, Barriers?:    Patient Strengths:  Beautiful, helpful  Patient Identified Areas of Improvement:  Expressing herself and communication  Current Recreation Participation:  Art  Patient Goal for Hospitalization:  Find a way to cope with the voices  [place] of Residence:  [place] of Residence:  [place]   Current SI (including self-harm):  No  Current HI:  No  Consent to Intern Participation: N/A   Leonette Monarch, LRT/CTRS 06/16/2015, 2:17 PM

## 2015-06-16 NOTE — Progress Notes (Signed)
Patient with depressed affect, cooperative behavior with meals, meds and plan of car. No SI/HI/AVH at this time. Withdrawn in bed this am with minimal interaction with peers. Does not hand in self audit sheet this am. Appropriate when writer initiates interaction. Safety maintained at this time.
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
## 2015-06-17 LAB — PROLACTIN: Prolactin: 15.8 ng/mL (ref 4.8–23.3)

## 2015-06-17 MED ORDER — MAGNESIUM CITRATE PO SOLN
1.0000 | ORAL | Status: DC | PRN
  Administered 2015-06-17 – 2015-06-18 (×2): 1 via ORAL
  Filled 2015-06-17 (×3): qty 296

## 2015-06-17 MED ORDER — POLYETHYLENE GLYCOL 3350 17 G PO PACK
17.0000 g | PACK | Freq: Every day | ORAL | Status: DC
  Administered 2015-06-17 – 2015-06-19 (×3): 17 g via ORAL
  Filled 2015-06-17 (×4): qty 1

## 2015-06-17 NOTE — BHH Group Notes (Signed)
[facility] LCSW Group Therapy  06/17/2015 4:27 PM  Type of Therapy:  Group Therapy  Participation Level:  Active  Participation Quality:  Appropriate and Attentive  Affect:  Appropriate  Cognitive:  Alert, Appropriate and Oriented  Insight:  Improving  Engagement in Therapy:  Engaged  Modes of Intervention:  Socialization and Support  Summary of Progress/Problems: Patient attended and participated in group discussion appropriately introducing herself and sharing during an introductory exercise that her 'superpower' is "art". Patient shared that she likes painting, drawing, and coloring. Patient was attentive throughout group discussion and was able to identify with other group members and their experiences with experiencing difficult emotions. Patient shared that she gets hurt easily but family and friends and is interested in coping skills to help her with this. Patient received feedback from other group members who were supportive.   Keene Breath, MSW, LCSWA 06/17/2015, 4:27 PM

## 2015-06-17 NOTE — Plan of Care (Signed)
Problem: Ineffective individual coping Goal: STG: Patient will remain free from self harm Outcome: Progressing Patient cooperative with plan of care.

## 2015-06-17 NOTE — BHH Group Notes (Signed)
[facility] LCSW Aftercare Discharge Planning Group Note   06/17/2015 4:26 PM  Participation Quality:  Patient was called to group but did not attend group and remained in bed.    Keene Breath, MSW, LCSWA

## 2015-06-17 NOTE — Progress Notes (Signed)
Recreation Therapy Notes  Date: 02.01.17 Time: 3:00 pm  Location: Craft Room  Group Topic: Self-esteem  Goal Area(s) Addresses:  Patients will write at least one positive trait about themselves. Patients will verbalize benefit of having a healthy self-esteem.  Behavioral Response: Attentive, Interactive  Intervention: I Am  Activity: Patients were given a worksheet with the letter I on it and instructed to write as many positive traits inside the letter.  Education: LRT educated patients on ways they can increase their self-esteem.  Education Outcome: Acknowledges education/In group clarification offered  Clinical Observations/Feedback: Patient completed activity by writing positive traits. Patient contributed to group discussion by stating that it was easy to think of positive traits and why, how her self-esteem affects her, and how she can increase her self-esteem.  Leonette Monarch, LRT/CTRS 06/17/2015 4:56 PM

## 2015-06-17 NOTE — Plan of Care (Signed)
Problem: Ineffective individual coping Goal: STG: Patient will remain free from self harm Outcome: Progressing Patient cooperative with meds and plan fo care.
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
## 2015-06-17 NOTE — Progress Notes (Signed)
Patient with depressed affect, withdrawn and sleepy in bed this am. No SI/HI/AVH at this time. Quiet with peers, sleepy. Appropriate when staff initiates interaction. Patient c/o of constipation and MD orders prn med and med to be administered on arrival from pharmacy. Safety maintained.

## 2015-06-17 NOTE — Progress Notes (Signed)
D: Pt is pleasant and cooperative this evening. Denies SI/HI. Denies pain. Pt states that she occasionally hears voices telling her that she "is not worth it." Pt c/o constipation and requests PRN medication for it. Pt is seen in milieu most of this evening interacting with peers. A: Emotional support and encouragement provided. Medications administered with education. q15 minute safety checks maintained. R: Pt remains free from harm.

## 2015-06-17 NOTE — Plan of Care (Signed)
Problem: Diagnosis: Increased Risk For Suicide Attempt Goal: LTG-Patient Will Report Improved Mood and Deny Suicidal LTG (by discharge) Patient will report improved mood and deny suicidal ideation.  Outcome: Progressing Pt denies SI at this time.  Problem: Ineffective individual coping Goal: STG: Pt will be able to identify effective and ineffective STG: Pt will be able to identify effective and ineffective coping patterns  Outcome: Progressing Pt reports that she learned a new coping skill today at group, guided imagery. Pt discussed with RN when she will use this coping skill.

## 2015-06-17 NOTE — Progress Notes (Signed)
[facility] MD Progress Note  06/17/2015 10:02 AM BLESSINGS NALEPA  MRN:  [id number]  Subjective:  Ms. Erasmo Leventhal still complains of auditory command hallucinations telling her herself, depression, and suicidal ideation. She is able to contract for safety. She complains of constipation. She is in bed this morning feeling weak. Last night she was out in the day room interacting appropriately with peers and staff in no apparent distress. She accepts medications and tolerates them well. Sleep and appetite are good    Principal Problem: Schizoaffective disorder, bipolar type ([place]) Diagnosis:   Patient Active Problem List   Diagnosis Date Noted  . Suicidal ideation [R45.851] 06/15/2015  . Schizoaffective disorder, bipolar type ([place]) [F25.0] 11/06/2014  . Tardive dyskinesia [G24.01] 10/06/2014  . Tobacco use disorder [F17.200] 09/30/2014  . Hypothyroidism [E03.9] 09/29/2014  . Hypertension [I10] 09/29/2014  . Constipation [K59.00] 09/29/2014   Total Time spent with patient: 20 minutes  Past Psychiatric History: Schizoaffective disorder. Past Medical History:  Past Medical History  Diagnosis Date  . Hallucinations 09/30/2014    Sizoaffective  . Depression   . Anxiety   . Hypertension   . Tardive dyskinesia 10/2014    recent onset  . GERD (gastroesophageal reflux disease)   . Hyperlipidemia     Past Surgical History  Procedure Laterality Date  . Wisdom tooth extraction    . Esophagogastroduodenoscopy N/A 11/28/2014    Procedure: ESOPHAGOGASTRODUODENOSCOPY (EGD);  Surgeon: [name], MD;  Location: [facility] ENDOSCOPY;  Service: Endoscopy;  Laterality: N/A;  . Abdominal surgery      "years ago" to remove foreign objects  . Breast lumpectomy     Family History:  Family History  Problem Relation Age of Onset  . Depression Mother   . Hypertension Mother    Family Psychiatric  History:Mother with depression.  Social History:  History  Alcohol Use No     History  Drug Use No    Social  History   Social History  . Marital Status: Single    Spouse Name: N/A  . Number of Children: N/A  . Years of Education: N/A   Social History Main Topics  . Smoking status: Current Every Day Smoker -- 0.50 packs/day for 3 years    Types: Cigarettes  . Smokeless tobacco: Never Used  . Alcohol Use: No  . Drug Use: No  . Sexual Activity: No   Other Topics Concern  . None   Social History Narrative   Additional Social History:                         Sleep: Fair  Appetite:  Fair  Current Medications: Current Facility-Administered Medications  Medication Dose Route Frequency Provider Last Rate Last Dose  . acetaminophen (TYLENOL) tablet 650 mg  650 mg Oral Q6H PRN [name], MD      . alum & mag hydroxide-simeth (MAALOX/MYLANTA) 200-200-20 MG/5ML suspension 30 mL  30 mL Oral Q4H PRN [name], MD      . amLODipine (NORVASC) tablet 2.5 mg  2.5 mg Oral Daily [name], MD   2.5 mg at 06/17/15 0912  . benztropine (COGENTIN) tablet 1 mg  1 mg Oral QHS [name], MD   1 mg at 06/16/15 2207  . docusate sodium (COLACE) capsule 100 mg  100 mg Oral BID [name], MD   100 mg at 06/17/15 0911  . fluPHENAZine (PROLIXIN) tablet 5 mg  5 mg Oral TID  AC & HS [name], MD   5 mg at 06/17/15 0651  . levothyroxine (SYNTHROID, LEVOTHROID) tablet 75 mcg  75 mcg Oral QAC breakfast [name], MD   75 mcg at 06/17/15 [phone]  . lurasidone (LATUDA) tablet 80 mg  80 mg Oral Q supper [name], MD   80 mg at 06/16/15 1618  . magnesium hydroxide (MILK OF MAGNESIA) suspension 30 mL  30 mL Oral Daily PRN [name], MD   30 mL at 06/16/15 2206  . nicotine (NICODERM CQ - dosed in mg/24 hours) patch 21 mg  21 mg Transdermal Daily Norlene Lanes B Loreena Valeri, MD   21 mg at 06/17/15 0913  . Oxcarbazepine (TRILEPTAL) tablet 150 mg  150 mg Oral BID [name], MD   150 mg at 06/17/15 0911  . temazepam (RESTORIL) capsule 15 mg  15 mg Oral QHS [name], MD   15  mg at 06/16/15 2207  . traZODone (DESYREL) tablet 50 mg  50 mg Oral QHS [name], MD   50 mg at 06/16/15 2207  . venlafaxine XR (EFFEXOR-XR) 24 hr capsule 150 mg  150 mg Oral Q breakfast [name], MD   150 mg at 06/17/15 [id number]    Lab Results:  Results for orders placed or performed during the hospital encounter of 06/15/15 (from the past 48 hour(s))  Hemoglobin A1c     Status: None   Collection Time: 06/16/15  8:35 AM  Result Value Ref Range   Hgb A1c MFr Bld 5.2 4.0 - 6.0 %  Lipid panel, fasting     Status: Abnormal   Collection Time: 06/16/15  8:35 AM  Result Value Ref Range   Cholesterol 148 0 - 200 mg/dL   Triglycerides 79 <150 mg/dL   HDL 37 (L) >40 mg/dL   Total CHOL/HDL Ratio 4.0 RATIO   VLDL 16 0 - 40 mg/dL   LDL Cholesterol 95 0 - 99 mg/dL    Comment:        Total Cholesterol/HDL:CHD Risk Coronary Heart Disease Risk Table                     Men   Women  1/2 Average Risk   3.4   3.3  Average Risk       5.0   4.4  2 X Average Risk   9.6   7.1  3 X Average Risk  23.4   11.0        Use the calculated Patient Ratio above and the CHD Risk Table to determine the patient's CHD Risk.        ATP III CLASSIFICATION (LDL):  <100     mg/dL   Optimal  100-129  mg/dL   Near or Above                    Optimal  130-159  mg/dL   Borderline  160-189  mg/dL   High  >190     mg/dL   Very High   TSH     Status: None   Collection Time: 06/16/15  8:35 AM  Result Value Ref Range   TSH 0.774 0.350 - 4.500 uIU/mL  Prolactin     Status: None   Collection Time: 06/16/15  8:35 AM  Result Value Ref Range   Prolactin 15.8 4.8 - 23.3 ng/mL    Comment: (NOTE) Performed At: [facility] [place], [place]  [id number] Lindon Romp MD [id number]     Physical Findings: AIMS: Facial and Oral Movements Muscles of Facial Expression: None, normal Lips and Perioral Area: None, normal Jaw: None, normal Tongue: None, normal,Extremity Movements Upper  (arms, wrists, hands, fingers): None, normal Lower (legs, knees, ankles, toes): None, normal, Trunk Movements Neck, shoulders, hips: None, normal, Overall Severity Severity of abnormal movements (highest score from questions above): None, normal Incapacitation due to abnormal movements: None, normal Patient's awareness of abnormal movements (rate only patient's report): No Awareness, Dental Status Current problems with teeth and/or dentures?: No Does patient usually wear dentures?: No  CIWA:    COWS:     Musculoskeletal: Strength & Muscle Tone: within normal limits Gait & Station: normal Patient leans: N/A  Psychiatric Specialty Exam: Review of Systems  Gastrointestinal: Positive for constipation.  All other systems reviewed and are negative.   Blood pressure 116/72, pulse 85, temperature 97.7 F (36.5 C), temperature source Oral, resp. rate 16, height 5' 8.31" (1.735 m), weight 87.091 kg (192 lb), last menstrual period 05/04/2015, SpO2 100 %.Body mass index is 28.93 kg/(m^2).  General Appearance: Disheveled  Eye Contact::  Minimal  Speech:  Clear and Coherent  Volume:  Normal  Mood:  Depressed  Affect:  Blunt  Thought Process:  Goal Directed  Orientation:  Full (Time, Place, and Person)  Thought Content:  Hallucinations: Auditory Command:  Telling her to hurt herself.  Suicidal Thoughts:  Yes.  with intent/plan  Homicidal Thoughts:  No  Memory:  Immediate;   Fair Recent;   Fair Remote;   Fair  Judgement:  Fair  Insight:  Shallow  Psychomotor Activity:  Decreased  Concentration:  Fair  Recall:  [employer] of Knowledge:Fair  Language: Fair  Akathisia:  No  Handed:  Right  AIMS (if indicated):     Assets:  Communication Skills Desire for Improvement Financial Resources/Insurance Housing Physical Health Resilience Social Support  ADL's:  Intact  Cognition: WNL  Sleep:  Number of Hours: 7.5   Treatment Plan Summary: Daily contact with patient to assess and  evaluate symptoms and progress in treatment and Medication management   [name] is a 25 year old female with history of schizoaffective disorder admitted for worsening of depression, auditory hallucinations, and suicidal ideation in the context of recent medication adjustments.  1. Suicidal ideation. The patient is still suicidal but able to contract for safety.  2. Mood and psychosis. We will continue Prolixin at the higher dose of 5 mg three times daily for psychosis, Lamictal and Trileptal for mood stabilization, and Effexor for depression.  3. Hypertension. She is on amlodipine.  4. Constipation. She is on Colace. We will add Miralax and Mag Citrate as needed.   5. Hypothyroidism. She is on Synthroid.  6. Smoking. Nicotine patch is available.  7. Insomnia. She is on Restoril and trazodone.  8. Social. She is incompetent adult. Her Kara Dies is the guardian. She recently moved to [place].  9. Metabolic syndrome. Lipid panel, hemoglobin A1c, TSH and all normal. Prolactin 15.   10. Abdominal x-ray is unremarkable.  11. Disposition. She will be discharged back to her group home. She will follow up with her primary psychiatrist at [place].  Orson Slick, MD 06/17/2015, 10:02 AM

## 2015-06-17 NOTE — [facility] Group Notes (Signed)
[facility] Group Notes:  (Nursing/MHT/Case Management/Adjunct)  Date:  06/17/2015  Time:  1:56 PM  Type of Therapy:  Psychoeducational Skills  Participation Level:  None  Participation Quality:  Attentive  Affect:  Appropriate  Cognitive:  Appropriate  Insight:  Appropriate  Engagement in Group:  Listening   Modes of Intervention:  Discussion and Education  Summary of Progress/Problems:  Melanie Bell 06/17/2015, 1:56 PM

## 2015-06-17 NOTE — Plan of Care (Signed)
Problem: B[facility] Participation in Recreation Therapeutic Interventions Goal: STG-Other Recreation Therapy Goal (Specify) STG: Self-expression - Within 4 treatment sessions, patient will verbalize understanding of different ways she can express her emotions in each of 2 treatment sessions to increase self-expression skills post d/c.  Outcome: Progressing Treatment Session 1; Completed 1 out of 2: At approximately 12:55 pm, LRT met with patient in craft room. LRT educated and provided patient with a list of ways she can express her emotions. Patient verbalized understanding. LRT encouraged patient to find ways to express her emotions in a healthy way. Intervention Used: Self-expression handout   M , LRT/CTRS 02.01.17 1:55 pm  Problem: BHH Participation in Recreation Therapeutic Interventions Goal: STG-Other Recreation Therapy Goal (Specify) STG: Decision Making - Within 4 treatment sessions, patient will verbalize understanding of decision making charts in each of 2 treatment sessions to increase decision making skills post d/c.  Outcome: Progressing Treatment Session 1; Completed 1 out of 2: At approximately 12:55 pm, LRT met with patient in craft room. LRT educated and provided patient with charts on decision making. Patient verbalized understanding. LRT encouraged patient to use the charts to help her make better decisions. Intervention Used: Decision Making charts   M , LRT/CTRS 02.01.17 1:58 pm     

## 2015-06-18 MED ORDER — LURASIDONE HCL 40 MG PO TABS
120.0000 mg | ORAL_TABLET | Freq: Every day | ORAL | Status: DC
  Administered 2015-06-18: 120 mg via ORAL
  Filled 2015-06-18: qty 3

## 2015-06-18 NOTE — Progress Notes (Signed)
D:  Voice  Goal today go to groups and work on coping skills Patient stated slept good last night .Stated appetite is good and energy levellowl. Stated concentration is good . Stated on Depression scale 5, hopeless 2 and anxiety  1.( low 0-10 high) Denies suicidal  homicidal ideations  .Voice of   auditory hallucinations . No pain concerns . Appropriate ADL'S. Interacting with peers and staff.  A: Encourage patient participation with unit programming . Instruction  Given on  Medication , verbalize understanding. R: Voice no other concerns. Staff continue to monitor

## 2015-06-18 NOTE — Plan of Care (Signed)
Problem: Ineffective individual coping Goal: STG: Patient will remain free from self harm Outcome: Progressing Pt safe on the unit at this time     

## 2015-06-18 NOTE — Tx Team (Signed)
Interdisciplinary Treatment Plan Update (Adult)  Date:  06/18/2015 Time Reviewed:  3:16 PM  Progress in Treatment: Attending groups: Yes. Participating in groups:  Yes. Taking medication as prescribed:  Yes. Tolerating medication:  Yes. Family/Significant othe contact made:  Yes, individual(s) contacted:  patient's mother/legal guardian Patient understands diagnosis:  Yes. Discussing patient identified problems/goals with staff:  Yes. Medical problems stabilized or resolved:  Yes. Denies suicidal/homicidal ideation: Yes. Issues/concerns per patient self-inventory:  Yes. Other:  New problem(s) identified: No, Describe:  none reported  Discharge Plan or Barriers:Patient will stabilize on medication and discharge to her group home. Patient will follow up with her outpatient provider at discharge.  Reason for Continuation of Hospitalization: Depression Hallucinations Medication stabilization Suicidal ideation  Comments:  Estimated length of stay: up to 1 day expected to discharge Friday 06/19/15  New goal(s):  Review of initial/current patient goals per problem list:   1.  Goal(s):Participate in aftercare plan  Met:  Yes  Target date:at discharge  As evidenced by: patient will participate in aftercare plan AEB aftercare provider and housing plan identified at discharge  06/16/15: Patient will return to group home and has follow up. Goal met.  2.  Goal (s):Decrease depression  Met:  Yes  Target date:at discharge  As evidenced [id number] demonstrates decreased symptoms of depression and reports a Depression rating of 3 or less  06/16/15: patient denies SI but cannot give depression rating of 3 or less  06/18/15: patient denies SI and reports depression of 0. Goal met.  3.  Goal(s): Decrease psychosis   Met:  No  Target date:by discharge  As evidenced [id number] demonstrates decreased symptoms of psychosis 06/16/15: patient reports AH 06/18/15: patient reports she is still  hearing voices  Attendees: Physician:  Orson Slick, MD 2/2/20173:16 PM  Nursing:   Carolynn Sayers, RN 2/2/20173:16 PM  Other:  Carmell Austria, [place] 2/2/20173:16 PM  Other:   2/2/20173:16 PM  Other:   2/2/20173:16 PM  Other:  2/2/20173:16 PM  Other:  2/2/20173:16 PM  Other:  2/2/20173:16 PM  Other:  2/2/20173:16 PM  Other:  2/2/20173:16 PM  Other:  2/2/20173:16 PM  Other:   2/2/20173:16 PM   Scribe for Treatment Team:   Keene Breath, MSW, [place] [phone]  06/18/2015, 3:16 PM

## 2015-06-18 NOTE — Progress Notes (Signed)
[facility] MD Progress Note  06/18/2015 2:02 PM Melanie Bell  MRN:  [name]  Subjective:  [name] still complains of loud auditory hallucinations telling her to kill herself. She is unable to contract for safety In the community. She does not appear in any distress. She participates in programming. She interacts with peers and staff appropriately.   Principal Problem: Schizoaffective disorder, bipolar type ([place]) Diagnosis:   Patient Active Problem List   Diagnosis Date Noted  . Suicidal ideation [R45.851] 06/15/2015  . Schizoaffective disorder, bipolar type ([place]) [F25.0] 11/06/2014  . Tardive dyskinesia [G24.01] 10/06/2014  . Tobacco use disorder [F17.200] 09/30/2014  . Hypothyroidism [E03.9] 09/29/2014  . Hypertension [I10] 09/29/2014  . Constipation [K59.00] 09/29/2014   Total Time spent with patient: 20 minutes  Past Psychiatric History: Schizoaffective disorder.   Past Medical History:  Past Medical History  Diagnosis Date  . Hallucinations 09/30/2014    Sizoaffective  . Depression   . Anxiety   . Hypertension   . Tardive dyskinesia 10/2014    recent onset  . GERD (gastroesophageal reflux disease)   . Hyperlipidemia     Past Surgical History  Procedure Laterality Date  . Wisdom tooth extraction    . Esophagogastroduodenoscopy N/A 11/28/2014    Procedure: ESOPHAGOGASTRODUODENOSCOPY (EGD);  Surgeon: [name], MD;  Location: [facility] ENDOSCOPY;  Service: Endoscopy;  Laterality: N/A;  . Abdominal surgery      "years ago" to remove foreign objects  . Breast lumpectomy     Family History:  Family History  Problem Relation Age of Onset  . Depression Mother   . Hypertension Mother    Family Psychiatric  History: Depression.al History:  History  Alcohol Use No     History  Drug Use No    Social History   Social History  . Marital Status: Single    Spouse Name: N/A  . Number of Children: N/A  . Years of Education: N/A   Social History Main Topics  .  Smoking status: Current Every Day Smoker -- 0.50 packs/day for 3 years    Types: Cigarettes  . Smokeless tobacco: Never Used  . Alcohol Use: No  . Drug Use: No  . Sexual Activity: No   Other Topics Concern  . None   Social History Narrative   Additional Social History:                         Sleep: Fair  Appetite:  Fair  Current Medications: Current Facility-Administered Medications  Medication Dose Route Frequency Provider Last Rate Last Dose  . acetaminophen (TYLENOL) tablet 650 mg  650 mg Oral Q6H PRN [name], MD      . alum & mag hydroxide-simeth (MAALOX/MYLANTA) 200-200-20 MG/5ML suspension 30 mL  30 mL Oral Q4H PRN [name], MD      . amLODipine (NORVASC) tablet 2.5 mg  2.5 mg Oral Daily [name], MD   2.5 mg at 06/18/15 0912  . benztropine (COGENTIN) tablet 1 mg  1 mg Oral QHS [name], MD   1 mg at 06/17/15 2140  . docusate sodium (COLACE) capsule 100 mg  100 mg Oral BID [name], MD   100 mg at 06/18/15 0912  . fluPHENAZine (PROLIXIN) tablet 5 mg  5 mg Oral TID AC & HS [name], MD   5 mg at 06/18/15 1158  . levothyroxine (SYNTHROID, LEVOTHROID) tablet 75 mcg  75 mcg Oral  QAC breakfast [name], MD   75 mcg at 06/18/15 0910  . lurasidone (LATUDA) tablet 80 mg  80 mg Oral Q supper [name], MD   80 mg at 06/17/15 1641  . magnesium citrate solution 1 Bottle  1 Bottle Oral PRN [name], MD   1 Bottle at 06/17/15 2138  . magnesium hydroxide (MILK OF MAGNESIA) suspension 30 mL  30 mL Oral Daily PRN [name], MD   30 mL at 06/16/15 2206  . nicotine (NICODERM CQ - dosed in mg/24 hours) patch 21 mg  21 mg Transdermal Daily Malyna Budney B Naiara Lombardozzi, MD   21 mg at 06/18/15 0915  . Oxcarbazepine (TRILEPTAL) tablet 150 mg  150 mg Oral BID [name], MD   150 mg at 06/18/15 0919  . polyethylene glycol (MIRALAX / GLYCOLAX) packet 17 g  17 g Oral Daily Sondra Blixt B Alzena Gerber, MD   17 g at 06/18/15 0919  .  temazepam (RESTORIL) capsule 15 mg  15 mg Oral QHS [name], MD   15 mg at 06/17/15 2140  . traZODone (DESYREL) tablet 50 mg  50 mg Oral QHS [name], MD   50 mg at 06/17/15 2140  . venlafaxine XR (EFFEXOR-XR) 24 hr capsule 150 mg  150 mg Oral Q breakfast [name], MD   150 mg at 06/18/15 [id number]    Lab Results: No results found for this or any previous visit (from the past 78 hour(s)).  Physical Findings: AIMS: Facial and Oral Movements Muscles of Facial Expression: None, normal Lips and Perioral Area: None, normal Jaw: None, normal Tongue: None, normal,Extremity Movements Upper (arms, wrists, hands, fingers): None, normal Lower (legs, knees, ankles, toes): None, normal, Trunk Movements Neck, shoulders, hips: None, normal, Overall Severity Severity of abnormal movements (highest score from questions above): None, normal Incapacitation due to abnormal movements: None, normal Patient's awareness of abnormal movements (rate only patient's report): No Awareness, Dental Status Current problems with teeth and/or dentures?: No Does patient usually wear dentures?: No  CIWA:    COWS:     Musculoskeletal: Strength & Muscle Tone: within normal limits Gait & Station: normal Patient leans: N/A  Psychiatric Specialty Exam: Review of Systems  Gastrointestinal: Positive for constipation.  All other systems reviewed and are negative.   Blood pressure 120/61, pulse 101, temperature 98.5 F (36.9 C), temperature source Oral, resp. rate 16, height 5' 8.31" (1.735 m), weight 87.091 kg (192 lb), last menstrual period 05/04/2015, SpO2 100 %.Body mass index is 28.93 kg/(m^2).  General Appearance: Casual  Eye Contact::  Good  Speech:  Clear and Coherent  Volume:  Normal  Mood:  Euthymic  Affect:  Appropriate  Thought Process:  Goal Directed  Orientation:  Full (Time, Place, and Person)  Thought Content:  Hallucinations: Auditory Command:  Telling her to kill herself.  Suicidal  Thoughts:  Yes.  with intent/plan  Homicidal Thoughts:  No  Memory:  Immediate;   Fair Recent;   Fair Remote;   Fair  Judgement:  Fair  Insight:  Fair  Psychomotor Activity:  Normal  Concentration:  Fair  Recall:  [employer] of [place]  Language: Fair  Akathisia:  No  Handed:  Right  AIMS (if indicated):     Assets:  Communication Skills Desire for Improvement Financial Resources/Insurance Housing Physical Health Resilience Social Support  ADL's:  Intact  Cognition: WNL  Sleep:  Number of Hours: 7   Treatment Plan Summary: Daily contact  with patient to assess and evaluate symptoms and progress in treatment and Medication management   [name] is a 25 year old female with history of schizoaffective disorder admitted for worsening of depression, auditory hallucinations, and suicidal ideation in the context of recent medication adjustments.  1. Suicidal ideation. The patient is still suicidal but able to contract for safety.  2. Mood and psychosis. We will continue Prolixin at the higher dose of 5 mg three times daily for psychosis, Latuda, Lamictal and Trileptal for mood stabilization, and Effexor for depression. Will increase Latuda to 120 mg at least temporarily.  3. Hypertension. She is on amlodipine.  4. Constipation. She is on Colace. We will add Miralax and Mag Citrate as needed.   5. Hypothyroidism. She is on Synthroid.  6. Smoking. Nicotine patch is available.  7. Insomnia. She is on Restoril and trazodone.  8. Social. She is incompetent adult. Her Kara Dies is the guardian. She recently moved to [place].  9. Metabolic syndrome. Lipid panel, hemoglobin A1c, TSH and all normal. Prolactin 15.   10. Abdominal x-ray is unremarkable.  11. Disposition. She will be discharged back to her group home. She will follow up with her primary psychiatrist at [place].  Orson Slick, MD 06/18/2015, 2:02 PM

## 2015-06-18 NOTE — Progress Notes (Signed)
Recreation Therapy Notes  Date: 02.02.17 Time: 3:00 pm Location: Craft Room  Group Topic: Leisure Education  Goal Area(s) Addresses:  Patient will identify things they are grateful for. Patient will identify how being grateful can influence decision making.  Behavioral Response: Attentive, Interactive  Intervention: Grateful Wheel  Activity: Patients were given an "I Am Grateful For" worksheet and instructed to list 2-3 things they are grateful for under each category.  Education: LRT educated patients on ways they can put leisure back into their schedule.  Education Outcome: Acknowledges education/In group clarification offered  Clinical Observations/Feedback: Patient completed activity by writing down things she is grateful for. Patient contributed to group discussion by stating what she is grateful for, and that she participates in leisure activities.  Leonette Monarch, LRT/CTRS 06/18/2015 3:51 PM

## 2015-06-18 NOTE — Plan of Care (Signed)
Problem: [facility] Participation in Recreation Therapeutic Interventions Goal: STG-Other Recreation Therapy Goal (Specify) STG: Self-expression - Within 4 treatment sessions, patient will verbalize understanding of different ways she can express her emotions in each of 2 treatment sessions to increase self-expression skills post d/c.  Outcome: Completed/Met Date Met:  06/18/15 Treatment Session 2; Completed 2 out of 2: At approximately 2:20 pm, LRT met with patient in patient room. LRT educated and provided patient with self-expression handouts. Patient verbalized understanding. Patient verbalized understanding of the different ways she can express her emotions. LRT encouraged patient to use the forms to express her emotions in a healthy way. Intervention Used: Self-expression handout  Leonette Monarch, LRT/CTRS 02.02.17 4:11 pm  Problem: [facility] Participation in Recreation Therapeutic Interventions Goal: STG-Other Recreation Therapy Goal (Specify) STG: Decision Making - Within 4 treatment sessions, patient will verbalize understanding of decision making charts in each of 2 treatment sessions to increase decision making skills post d/c.  Outcome: Completed/Met Date Met:  06/18/15 Treatment Session 2; Completed 2 out of 2: At approximately 2:20 pm, LRT met with patient in patient room. Patient verbalized understanding of the decision making charts. LRT encouraged patient to use the charts to help her make better decisions. Intervention Used: Decision Making charts  Leonette Monarch, LRT/CTRS 02.02.17 4:12 pm

## 2015-06-18 NOTE — Plan of Care (Signed)
Problem: Ineffective individual coping Goal: LTG: Patient will report a decrease in negative feelings Outcome: Progressing Attending unit programing

## 2015-06-18 NOTE — Progress Notes (Signed)
D: Pt denies SI/HI/AVH. Pt is pleasant and cooperative. Pt stated she did very well on the Risperdal, but had increased prolactin levels , so she started to lactate. Pt said the Risperdal seems to be the only thing to reduce the voices to a comfortable level.   A: Pt was offered support and encouragement. Pt was given scheduled medications. Pt was encourage to attend groups. Q 15 minute checks were done for safety.   R:Pt attends groups and interacts well with peers and staff. Pt is taking medication. Pt receptive to treatment and safety maintained on unit.

## 2015-06-18 NOTE — BHH Group Notes (Signed)
[place] LCSW Group Therapy  06/18/2015 3:53 PM  Type of Therapy: Group Therapy  Participation Level: Minimal    Participation Quality: Attentive  Affect: Flat  Cognitive: Alert  Insight: Limited  Engagement in Therapy: Limited  Modes of Intervention: Discussion, Education, Socialization and Support  Summary of Progress/Problems: Balance in life: Patients will discuss the concept of balance and how it looks and feels to be unbalanced. Pt will identify areas in their life that is unbalanced and ways to become more balanced. Pt attended group and stayed the entire time. She sat quietly and listened to other group members.   [place] MSW, [place]  06/18/2015, 3:53 PM

## 2015-06-18 NOTE — BHH Group Notes (Signed)
[facility] Group Notes:  (Nursing/MHT/Case Management/Adjunct)  Date:  06/18/2015  Time:  12:50 AM  Type of Therapy:  Group Therapy  Participation Level:  Active  Participation Quality:  Appropriate  Affect:  Appropriate  Cognitive:  Appropriate  Insight:  Appropriate  Engagement in Group:  Engaged  Modes of Intervention:  n/a  Summary of Progress/Problems:  Melanie Bell 06/18/2015, 12:50 AM

## 2015-06-19 MED ORDER — FLUPHENAZINE HCL 5 MG PO TABS: 5.0000 mg | ORAL_TABLET | Freq: Three times a day (TID) | ORAL | Status: DC

## 2015-06-19 MED ORDER — ALUM & MAG HYDROXIDE-SIMETH 200-200-20 MG/5ML PO SUSP
30.0000 mL | ORAL | Status: DC | PRN
Start: 2015-06-19 — End: 2015-09-16

## 2015-06-19 MED ORDER — TEMAZEPAM 15 MG PO CAPS: 15.0000 mg | ORAL_CAPSULE | Freq: Every evening | ORAL | Status: DC | PRN

## 2015-06-19 MED ORDER — TRAZODONE HCL 50 MG PO TABS: 50.0000 mg | ORAL_TABLET | Freq: Every day | ORAL | Status: DC

## 2015-06-19 MED ORDER — LURASIDONE HCL 120 MG PO TABS: 120.0000 mg | ORAL_TABLET | Freq: Every day | ORAL | Status: DC

## 2015-06-19 MED ORDER — LEVOTHYROXINE SODIUM 75 MCG PO TABS: 75.0000 ug | ORAL_TABLET | Freq: Every day | ORAL | Status: DC

## 2015-06-19 MED ORDER — OXCARBAZEPINE 150 MG PO TABS: 150.0000 mg | ORAL_TABLET | Freq: Two times a day (BID) | ORAL | Status: DC

## 2015-06-19 MED ORDER — DOCUSATE SODIUM 100 MG PO CAPS: 200.0000 mg | ORAL_CAPSULE | Freq: Two times a day (BID) | ORAL | Status: DC

## 2015-06-19 MED ORDER — AMLODIPINE BESYLATE 2.5 MG PO TABS: 2.5000 mg | ORAL_TABLET | Freq: Every day | ORAL | Status: DC

## 2015-06-19 MED ORDER — BENZTROPINE MESYLATE 1 MG PO TABS: 1.0000 mg | ORAL_TABLET | Freq: Every day | ORAL | Status: DC

## 2015-06-19 MED ORDER — ACETAMINOPHEN 325 MG PO TABS: 650.0000 mg | ORAL_TABLET | Freq: Four times a day (QID) | ORAL | Status: DC | PRN

## 2015-06-19 MED ORDER — VENLAFAXINE HCL ER 150 MG PO CP24: 150.0000 mg | ORAL_CAPSULE | Freq: Every day | ORAL | Status: DC

## 2015-06-19 NOTE — BHH Suicide Risk Assessment (Signed)
[facility] Discharge Suicide Risk Assessment   Principal Problem: Schizoaffective disorder, bipolar type [facility]) Discharge Diagnoses:  Patient Active Problem List   Diagnosis Date Noted  . Suicidal ideation [R45.851] 06/15/2015  . Schizoaffective disorder, bipolar type ([place]) [F25.0] 11/06/2014  . Tardive dyskinesia [G24.01] 10/06/2014  . Tobacco use disorder [F17.200] 09/30/2014  . Hypothyroidism [E03.9] 09/29/2014  . Hypertension [I10] 09/29/2014  . Constipation [K59.00] 09/29/2014    Total Time spent with patient: 30 minutes  Musculoskeletal: Strength & Muscle Tone: within normal limits Gait & Station: normal Patient leans: N/A  Psychiatric Specialty Exam: Review of Systems  Gastrointestinal: Positive for constipation.  All other systems reviewed and are negative.   Blood pressure 110/74, pulse 98, temperature 98.1 F (36.7 C), temperature source Oral, resp. rate 16, height 5' 8.31" (1.735 m), weight 87.091 kg (192 lb), last menstrual period 05/04/2015, SpO2 100 %.Body mass index is 28.93 kg/(m^2).  General Appearance: Casual  Eye Contact::  Good  Speech:  Clear and Coherent409  Volume:  Normal  Mood:  Euthymic  Affect:  Blunt  Thought Process:  Goal Directed  Orientation:  Full (Time, Place, and Person)  Thought Content:  Hallucinations: Auditory  Suicidal Thoughts:  No  Homicidal Thoughts:  No  Memory:  Immediate;   Fair Recent;   Fair Remote;   Fair  Judgement:  Fair  Insight:  Shallow  Psychomotor Activity:  Normal  Concentration:  Fair  Recall:  [place]  Language: Fair  Akathisia:  No  Handed:  Right  AIMS (if indicated):     Assets:  Communication Skills Desire for Improvement Financial Resources/Insurance Housing Physical Health Resilience Social Support  Sleep:  Number of Hours: 8  Cognition: WNL  ADL's:  Intact   Mental Status Per Nursing Assessment::   On Admission:  Self-harm thoughts, Self-harm behaviors  Demographic  Factors:  Adolescent or young adult and Caucasian  Loss Factors: NA  Historical Factors: Prior suicide attempts, Family history of mental illness or substance abuse and Impulsivity  Risk Reduction Factors:   Sense of responsibility to family, Living with another person, especially a relative, Positive social support and Positive therapeutic relationship  Continued Clinical Symptoms:  Schizophrenia:   Less than 72 years old  Cognitive Features That Contribute To Risk:  None    Suicide Risk:  Minimal: No identifiable suicidal ideation.  Patients presenting with no risk factors but with morbid ruminations; may be classified as minimal risk based on the severity of the depressive symptoms  Follow-up Information    Follow up with [facility]. Go on 06/26/2015.   Why:  For follow-up care appt on Friday 06/26/15 at 1:00pm   Contact information:   [place], [place] Fax [phone]       Follow up with [facility]. Go on 06/19/2015.   Contact information:   [address] [place], [place] [address] Ph [phone]      Plan Of Care/Follow-up recommendations:  Activity:  As tolerated. Diet:  Low sodium heart healthy. Other:  Keep follow-up appointments.  Orson Slick, MD 06/19/2015, 11:07 AM

## 2015-06-19 NOTE — BHH Group Notes (Signed)
[facility] Group Notes:  (Nursing/MHT/Case Management/Adjunct)  Date:  06/19/2015  Time:  2:49 PM  Type of Therapy:  Group Therapy  Participation Level:  Did Not Attend  [name] De'Chelle Mearl [place] 06/19/2015, 2:49 PM

## 2015-06-19 NOTE — Progress Notes (Signed)
  [facility] Adult Case Management Discharge Plan :  Will you be returning to the same living situation after discharge:  Yes,  back to group home At discharge, do you have transportation home?: Yes,  group home will pick up Do you have the ability to pay for your medications: Yes,  patient has Medicaid  Release of information consent forms completed and in the chart;  Patient's signature needed at discharge.  Patient to Follow up at: Follow-up Information    Follow up with [facility]. Go on 06/26/2015.   Why:  For follow-up care appt on Friday 06/26/15 at 1:00pm   Contact information:   [place], [place] Fax [phone]       Follow up with [facility]. Go on 06/19/2015.   Contact information:   [place], [place] [address] Ph [phone]      Next level of care provider has access to [place] and Suicide Prevention discussed: Yes,  SPE discussed with patient and [place] [facility] of [place] home [phone])  Have you used any form of tobacco in the last 30 days? (Cigarettes, Smokeless Tobacco, Cigars, and/or Pipes): Yes  Has patient been referred to the Quitline?: Patient refused referral  Patient has been referred for addiction treatment: [place], [place], MSW, LCSWA [phone]  06/19/2015, 11:04 AM

## 2015-06-19 NOTE — Progress Notes (Signed)
Patient denies SI/HI, denies A/V hallucinations. Patient verbalizes understanding of discharge instructions, follow up care and prescriptions. Patient given all belongings from  locker. Patient escorted out by staff, transported by group home staff. 

## 2015-06-19 NOTE — BHH Suicide Risk Assessment (Signed)
[facility] INPATIENT:  Family/Significant Other Suicide Prevention Education  Suicide Prevention Education:  Education Completed; [place] [facility] [place] home [phone]) has been identified by the patient as the family member/significant other with whom the patient will be residing, and identified as the person(s) who will aid the patient in the event of a mental health crisis (suicidal ideations/suicide attempt).  With written consent from the patient, the family member/significant other has been provided the following suicide prevention education, prior to the and/or following the discharge of the patient.  The suicide prevention education provided includes the following:  Suicide risk factors  Suicide prevention and interventions  National Suicide Hotline telephone number  [facility] assessment telephone number  [facility] Emergency Assistance [place] and/or Residential Mobile Crisis Unit telephone number  Request made of family/significant other to:  Remove weapons (e.g., guns, rifles, knives), all items previously/currently identified as safety concern.    Remove drugs/medications (over-the-counter, prescriptions, illicit drugs), all items previously/currently identified as a safety concern.  The family member/significant other verbalizes understanding of the suicide prevention education information provided.  The family member/significant other agrees to remove the items of safety concern listed above.  Keene Breath, MSW, [place] 06/19/2015, 11:00 AM

## 2015-06-19 NOTE — Discharge Summary (Signed)
Physician Discharge Summary Note  Patient:  Melanie Bell is an 25 y.o., female MRN:  [name] DOB:  Aug 06, 1990 Patient phone:  [phone] (home)  Patient address:   [place] [address],  Total Time spent with patient: 30 minutes  Date of Admission:  06/15/2015 Date of Discharge: 06/19/2015  Reason for Admission:  Psychotic brake.  Identifying data. Melanie Bell is a 25 year old female with history of schizoaffective disorder.  Chief complaint. "I have voices."  History of present illness. Information was obtained from the patient and the chart. [name] has a long history of depression psychosis and mood instability. She is well known to [name] and has been hospitalized at [facility] on multiple occasions. is ability. She has been relatively stable on a combination of Prolixin, Latuda and Trileptal. During her last visit to her psychiatrist Prolixin was lowered to twice a day from 3 times a day. The patient started experiencing auditory hallucinations telling her to kill herself. She came to the hospital for help. She denies symptoms of depression or severe anxiety. her suicidal thinking is precipitated by the voices. She is unable to identify any stressors other than change of medication regimen. Her father however who is the guardian relocated from [place] to [place] now. It is likely that the patient has very limited contact with her mother now. She denies symptoms suggestive of bipolar mania. There is no alcohol or drugs involved.   Past psychiatric history. The patient has been hospitalized numerous times for mood instability, suicidal thinking and swallowing of foreign objects. Recently she seems to develop better coping skills and has been able to come to the hospital before she actually swallows unfavorable objects. She has been tried on multiple medications. Seems that the current regimen keeps her stable.   Family psychiatric history. Mother with  depression.   Social history. She is incompetent adult. She lives in a group home. Her home life was disrupted when her mother divorced her stepfather. She lost contact with her step grandmother who was very important to her. Her mother has been experiencing some family issues. This worries and the patient a lot.   Principal Problem: Schizoaffective disorder, bipolar type [facility]) Discharge Diagnoses: Patient Active Problem List   Diagnosis Date Noted  . Suicidal ideation [R45.851] 06/15/2015  . Schizoaffective disorder, bipolar type ([place]) [F25.0] 11/06/2014  . Tardive dyskinesia [G24.01] 10/06/2014  . Tobacco use disorder [F17.200] 09/30/2014  . Hypothyroidism [E03.9] 09/29/2014  . Hypertension [I10] 09/29/2014  . Constipation [K59.00] 09/29/2014    Past Psychiatric History: Schizophrenia.  Past Medical History:  Past Medical History  Diagnosis Date  . Hallucinations 09/30/2014    Sizoaffective  . Depression   . Anxiety   . Hypertension   . Tardive dyskinesia 10/2014    recent onset  . GERD (gastroesophageal reflux disease)   . Hyperlipidemia     Past Surgical History  Procedure Laterality Date  . Wisdom tooth extraction    . Esophagogastroduodenoscopy N/A 11/28/2014    Procedure: ESOPHAGOGASTRODUODENOSCOPY (EGD);  Surgeon: [name], MD;  Location: [facility] ENDOSCOPY;  Service: Endoscopy;  Laterality: N/A;  . Abdominal surgery      "years ago" to remove foreign objects  . Breast lumpectomy     Family History:  Family History  Problem Relation Age of Onset  . Depression Mother   . Hypertension Mother    Family Psychiatric  History: Depression and anxiety. Social History:  History  Alcohol Use No  History  Drug Use No    Social History   Social History  . Marital Status: Single    Spouse Name: N/A  . Number of Children: N/A  . Years of Education: N/A   Social History Main Topics  . Smoking status: Current Every Day Smoker -- 0.50 packs/day for 3 years     Types: Cigarettes  . Smokeless tobacco: Never Used  . Alcohol Use: No  . Drug Use: No  . Sexual Activity: No   Other Topics Concern  . None   Social History Narrative    Hospital Course:    Melanie Bell is a 25 year old female with history of schizoaffective disorder admitted for worsening of depression, auditory hallucinations, and suicidal ideation in the context of recent medication adjustments.  1. Suicidal ideation. This has resolved.   2. Mood and psychosis. We increased Prolixin to 5 mg three times daily and Latuda to 120 mg for psychosis. We continued Lamictal and Trileptal for mood stabilization, and Effexor for depression.   3. Hypertension. She is on amlodipine.  4. Constipation. She is on Colace.  5. Hypothyroidism. She is on Synthroid.  6. Smoking. Nicotine patch is available.  7. Insomnia. She is on Restoril and trazodone.  8. Social. She is incompetent adult. Her Melanie Bell is the guardian. She recently moved to [place].  9. Metabolic syndrome. Lipid panel, hemoglobin A1c, TSH and all normal. Prolactin 15.   10. Abdominal x-ray is unremarkable.  11. Disposition. She was discharged back to her group home. She will follow up with her primary psychiatrist at [facility].  Physical Findings: AIMS: Facial and Oral Movements Muscles of Facial Expression: None, normal Lips and Perioral Area: None, normal Jaw: None, normal Tongue: None, normal,Extremity Movements Upper (arms, wrists, hands, fingers): None, normal Lower (legs, knees, ankles, toes): None, normal, Trunk Movements Neck, shoulders, hips: None, normal, Overall Severity Severity of abnormal movements (highest score from questions above): None, normal Incapacitation due to abnormal movements: None, normal Patient's awareness of abnormal movements (rate only patient's report): No Awareness, Dental Status Current problems with teeth and/or dentures?: No Does patient usually wear dentures?: No  CIWA:    COWS:      Musculoskeletal: Strength & Muscle Tone: within normal limits Gait & Station: normal Patient leans: N/A  Psychiatric Specialty Exam: Review of Systems  Gastrointestinal: Positive for constipation.  All other systems reviewed and are negative.   Blood pressure 110/74, pulse 98, temperature 98.1 F (36.7 C), temperature source Oral, resp. rate 16, height 5' 8.31" (1.735 m), weight 87.091 kg (192 lb), last menstrual period 05/04/2015, SpO2 100 %.Body mass index is 28.93 kg/(m^2).  See SRA.                                                  Sleep:  Number of Hours: 8   Have you used any form of tobacco in the last 30 days? (Cigarettes, Smokeless Tobacco, Cigars, and/or Pipes): Yes  Has this patient used any form of tobacco in the last 30 days? (Cigarettes, Smokeless Tobacco, Cigars, and/or Pipes) Yes, Yes, A prescription for an FDA-approved tobacco cessation medication was offered at discharge and the patient refused  Metabolic Disorder Labs:  Lab Results  Component Value Date   HGBA1C 5.2 06/16/2015   Lab Results  Component Value Date   PROLACTIN 15.8 06/16/2015   Lab  Results  Component Value Date   CHOL 148 06/16/2015   TRIG 79 06/16/2015   HDL 37* 06/16/2015   CHOLHDL 4.0 06/16/2015   VLDL 16 06/16/2015   LDLCALC 95 06/16/2015   LDLCALC 114* 11/06/2014    See Psychiatric Specialty Exam and Suicide Risk Assessment completed by Attending Physician prior to discharge.  Discharge destination:  Home  Is patient on multiple antipsychotic therapies at discharge:  Yes,   Do you recommend tapering to monotherapy for antipsychotics?  No   Has Patient had three or more failed trials of antipsychotic monotherapy by history:  Yes,   Antipsychotic medications that previously failed include:   1.  Seroquel., 2.  Zyprexa. and 3.  prolixin.  Recommended Plan for Multiple Antipsychotic Therapies: Additional reason(s) for multiple antispychotic treatment:  Poor  response to a single agent.  Discharge Instructions    Diet - low sodium heart healthy    Complete by:  As directed      Diet - low sodium heart healthy    Complete by:  As directed      Increase activity slowly    Complete by:  As directed      Increase activity slowly    Complete by:  As directed             Medication List    STOP taking these medications        ibuprofen 800 MG tablet  Commonly known as:  ADVIL,MOTRIN     senna 8.6 MG Tabs tablet  Commonly known as:  SENOKOT      TAKE these medications      Indication   acetaminophen 325 MG tablet  Commonly known as:  TYLENOL  Take 2 tablets (650 mg total) by mouth every 6 (six) hours as needed for mild pain.   Indication:  Fever, Pain     alum & mag hydroxide-simeth 200-200-20 MG/5ML suspension  Commonly known as:  MAALOX/MYLANTA  Take 30 mLs by mouth every 4 (four) hours as needed for indigestion.   Indication:  upset stomsch     amLODipine 2.5 MG tablet  Commonly known as:  NORVASC  Take 1 tablet (2.5 mg total) by mouth daily.   Indication:  High Blood Pressure     benztropine 1 MG tablet  Commonly known as:  COGENTIN  Take 1 tablet (1 mg total) by mouth at bedtime.   Indication:  Extrapyramidal Reaction caused by Medications     docusate sodium 100 MG capsule  Commonly known as:  COLACE  Take 2 capsules (200 mg total) by mouth 2 (two) times daily.   Indication:  Constipation     fluPHENAZine 5 MG tablet  Commonly known as:  PROLIXIN  Take 1 tablet (5 mg total) by mouth 3 (three) times daily.   Indication:  Schizophrenia     levothyroxine 75 MCG tablet  Commonly known as:  SYNTHROID, LEVOTHROID  Take 1 tablet (75 mcg total) by mouth daily before breakfast.   Indication:  Underactive Thyroid     Lurasidone HCl 120 MG Tabs  Take 1 tablet (120 mg total) by mouth daily with supper.   Indication:  Schizophrenia     OXcarbazepine 150 MG tablet  Commonly known as:  TRILEPTAL  Take 1 tablet (150 mg  total) by mouth 2 (two) times daily.   Indication:  Manic-Depression     temazepam 15 MG capsule  Commonly known as:  RESTORIL  Take 1 capsule (15 mg total) by mouth  at bedtime as needed for sleep.   Indication:  Trouble Sleeping     traZODone 50 MG tablet  Commonly known as:  DESYREL  Take 1 tablet (50 mg total) by mouth at bedtime.   Indication:  Trouble Sleeping     venlafaxine XR 150 MG 24 hr capsule  Commonly known as:  EFFEXOR-XR  Take 1 capsule (150 mg total) by mouth daily with breakfast.   Indication:  Major Depressive Disorder           Follow-up Information    Follow up with [facility]. [facility] on 06/26/2015.   Why:  For follow-up care appt on Friday 06/26/15 at 1:00pm   Contact information:   [place], [place] Fax [phone]       Follow up with [facility]. Go on 06/19/2015.   Contact information:   [address] [place], [place] [address] Ph [phone]      Follow-up recommendations:  Activity:  As tolerated. Diet:  Low sodium heart healthy. Other:  Keep follow-up appointments.  Comments:    Signed: Orson Slick, MD 06/19/2015, 1:02 PM

## 2015-06-19 NOTE — Progress Notes (Signed)
D: Observed pt lying in bed in room. Patient alert and oriented x4. Patient denies SI/HI/VH. Pt endorses hearing voices that "tell me to kill myself.Pt contracts for safety. Pt affect is sad. Pt indicated to write that auditory hallucinations have not improved since admission. Pt rates depression 5/10 and anxiety 4/10. Pt continues to complain of constipation and indicates that she hasn't had a bowel movement in 6 days. A: Offered active listening and support. Provided therapeutic communication. Administered scheduled medications. Administered magnesium citrate prn for constipation. Encouraged pt to contact staff if voices became overwhelming or if pt developed SI. R: Pt verbalized understanding. Pt pleasant and cooperative. Pt medication compliant. Will continue Q15 min. checks. Safety maintained.

## 2015-06-19 NOTE — NC FL2 (Signed)
[place] LEVEL OF CARE SCREENING TOOL     IDENTIFICATION  Patient [name] Birthdate: 1991/01/03 Sex: female Admission Date (Current Location): 06/15/2015  [place] and [place] Number:  [name] [id number] [place] and Address:  [facility], [address] [place], [place] [address]      Provider Number: [id number]  Attending Physician Name and Address:  [name], MD  Relative Name and Phone Number:   Guardian/[name] [phone]    Current Level of Care: Hospital Recommended Level of Care: Other (Comment) (Group Home) Prior Approval Number:    Date Approved/Denied:   PASRR Number:    Discharge Plan: Other (Comment) ([place], [place] )    Current Diagnoses: Patient Active Problem List   Diagnosis Date Noted  . Suicidal ideation 06/15/2015  . Schizoaffective disorder, bipolar type ([place]) 11/06/2014  . Tardive dyskinesia 10/06/2014  . Tobacco use disorder 09/30/2014  . Hypothyroidism 09/29/2014  . Hypertension 09/29/2014  . Constipation 09/29/2014    Orientation RESPIRATION BLADDER Height & Weight     Self, Time, Situation, Place  Normal Continent Weight: 192 lb (87.091 kg) Height:  5' 8.31" (173.5 cm)  BEHAVIORAL SYMPTOMS/MOOD NEUROLOGICAL BOWEL NUTRITION STATUS      Continent  (normal)  AMBULATORY STATUS COMMUNICATION OF NEEDS Skin   Independent Verbally Normal                       Personal Care Assistance Level of Assistance  Bathing, Feeding, Dressing Bathing Assistance: Independent Feeding assistance: Independent Dressing Assistance: Independent     Functional Limitations Info  Sight, Hearing, Speech Sight Info: Adequate Hearing Info: Adequate Speech Info: Adequate    SPECIAL CARE FACTORS FREQUENCY                       Contractures      Additional Factors Info  Psychotropic                Current Medications (06/19/2015):  This is the current hospital active medication list Current Facility-Administered Medications  Medication Dose Route Frequency Provider Last Rate Last Dose  . acetaminophen (TYLENOL) tablet 650 mg  650 mg Oral Q6H PRN [name], MD      . alum & mag hydroxide-simeth (MAALOX/MYLANTA) 200-200-20 MG/5ML suspension 30 mL  30 mL Oral Q4H PRN [name], MD      . amLODipine (NORVASC) tablet 2.5 mg  2.5 mg Oral Daily [name], MD   2.5 mg at 06/19/15 [id number]  . benztropine (COGENTIN) tablet 1 mg  1 mg Oral QHS [name], MD   1 mg at 06/18/15 2145  . docusate sodium (COLACE) capsule 100 mg  100 mg Oral BID [name], MD   100 mg at 06/19/15 [id number]  . fluPHENAZine (PROLIXIN) tablet 5 mg  5 mg Oral TID AC & HS [name], MD   5 mg at 06/19/15 1234  . levothyroxine (SYNTHROID, LEVOTHROID) tablet 75 mcg  75 mcg Oral QAC breakfast [name], MD   75 mcg at 06/19/15 [phone]  . lurasidone (LATUDA) tablet 120 mg  120 mg Oral Q supper [name], MD   120 mg at 06/18/15 1734  . magnesium citrate solution 1 Bottle  1 Bottle Oral PRN [name], MD   1 Bottle at 06/18/15 2150  . magnesium  hydroxide (MILK OF MAGNESIA) suspension 30 mL  30 mL Oral Daily PRN [name], MD   30 mL at 06/16/15 2206  . nicotine (NICODERM CQ - dosed in mg/24 hours) patch 21 mg  21 mg Transdermal Daily [name], MD   21 mg at 06/19/15 0936  . Oxcarbazepine (TRILEPTAL) tablet 150 mg  150 mg Oral BID [name], MD   150 mg at 06/19/15 [id number]  . polyethylene glycol (MIRALAX / GLYCOLAX) packet 17 g  17 g Oral Daily [name] [name], MD   17 g at 06/19/15 0935  . temazepam (RESTORIL) capsule 15 mg  15 mg Oral QHS [name], MD   15 mg at 06/18/15 2145  . traZODone (DESYREL) tablet 50 mg  50 mg Oral QHS [name], MD   50 mg at 06/18/15 2145  . venlafaxine XR (EFFEXOR-XR) 24 hr capsule 150 mg  150 mg Oral Q breakfast [name], MD   150 mg at 06/19/15 [id number]     Discharge Medications: Please see discharge summary for a list of discharge medications.  Relevant Imaging Results:  Relevant Lab Results:   Additional Information    Iris Pert 06/19/2015  [phone]

## 2015-06-19 NOTE — Plan of Care (Signed)
Problem: Diagnosis: Increased Risk For Suicide Attempt Goal: STG-Patient Will Comply With Medication Regime Outcome: Progressing Pt is medication compliant

## 2015-06-19 NOTE — Tx Team (Signed)
Interdisciplinary Treatment Plan Update (Adult)  Date:  06/19/2015 Time Reviewed:  11:02 AM  Progress in Treatment: Attending groups: Yes. Participating in groups:  Yes. Taking medication as prescribed:  Yes. Tolerating medication:  Yes. Family/Significant othe contact made:  Yes, individual(s) contacted:  patient's mother/legal guardian Patient understands diagnosis:  Yes. Discussing patient identified problems/goals with staff:  Yes. Medical problems stabilized or resolved:  Yes. Denies suicidal/homicidal ideation: Yes. Issues/concerns per patient self-inventory:  Yes. Other:  New problem(s) identified: No, Describe:  none reported  Discharge Plan or Barriers:Patient will stabilize on medication and discharge to her group home. Patient will follow up with her outpatient provider at discharge.  Reason for Continuation of Hospitalization: Depression Hallucinations Medication stabilization Suicidal ideation  Comments:  Estimated length of stay: 0 days will discharge today Friday 06/19/15  New goal(s):  Review of initial/current patient goals per problem list:   1.  Goal(s):Participate in aftercare plan  Met:  Yes  Target date:at discharge  As evidenced by: patient will participate in aftercare plan AEB aftercare provider and housing plan identified at discharge  06/16/15: Patient will return to group home and has follow up. Goal met.  2.  Goal (s):Decrease depression  Met:  Yes  Target date:at discharge  As evidenced MW:[id number] demonstrates decreased symptoms of depression and reports a Depression rating of 3 or less  06/16/15: patient denies SI but cannot give depression rating of 3 or less  06/18/15: patient denies SI and reports depression of 0. Goal met.  3.  Goal(s): Decrease psychosis   Met:  Yes  Target date:by discharge  As evidenced NU:[id number] demonstrates decreased symptoms of psychosis 06/16/15: patient reports AH 06/18/15: patient reports she is still  hearing voices 06/19/15: patient still reports voices but is stable for discharge per MD  Attendees: Physician:  Orson Slick, MD 2/3/201711:02 AM  Nursing:   Tyler Pita, RN 2/3/201711:02 AM  Other:  Carmell Austria, [place] 2/3/201711:02 AM  Other:   2/3/201711:02 AM  Other:   2/3/201711:02 AM  Other:  2/3/201711:02 AM  Other:  2/3/201711:02 AM  Other:  2/3/201711:02 AM  Other:  2/3/201711:02 AM  Other:  2/3/201711:02 AM  Other:  2/3/201711:02 AM  Other:   2/3/201711:02 AM   Scribe for Treatment Team:   Keene Breath, MSW, LCSWA [phone]  06/19/2015, 11:02 AM

## 2015-06-22 ENCOUNTER — Emergency Department
Admission: EM | Admit: 2015-06-22 | Discharge: 2015-06-24 | Disposition: A | Discharge status: Other Acute Inpatient Hospital | Payer: Medicaid Other | Attending: Emergency Medicine | Admitting: Emergency Medicine

## 2015-06-22 ENCOUNTER — Encounter: Payer: Self-pay | Admitting: *Deleted

## 2015-06-22 DIAGNOSIS — F1310 Sedative, hypnotic or anxiolytic abuse, uncomplicated: Secondary | ICD-10-CM | POA: Diagnosis not present

## 2015-06-22 DIAGNOSIS — Z3202 Encounter for pregnancy test, result negative: Secondary | ICD-10-CM | POA: Insufficient documentation

## 2015-06-22 DIAGNOSIS — Z79899 Other long term (current) drug therapy: Secondary | ICD-10-CM | POA: Diagnosis not present

## 2015-06-22 DIAGNOSIS — F250 Schizoaffective disorder, bipolar type: Secondary | ICD-10-CM | POA: Diagnosis not present

## 2015-06-22 DIAGNOSIS — I10 Essential (primary) hypertension: Secondary | ICD-10-CM | POA: Insufficient documentation

## 2015-06-22 DIAGNOSIS — R440 Auditory hallucinations: Secondary | ICD-10-CM | POA: Diagnosis not present

## 2015-06-22 DIAGNOSIS — F17210 Nicotine dependence, cigarettes, uncomplicated: Secondary | ICD-10-CM | POA: Insufficient documentation

## 2015-06-22 DIAGNOSIS — F419 Anxiety disorder, unspecified: Secondary | ICD-10-CM | POA: Insufficient documentation

## 2015-06-22 DIAGNOSIS — F919 Conduct disorder, unspecified: Secondary | ICD-10-CM | POA: Diagnosis present

## 2015-06-22 LAB — CBC
HCT: 36.8 % (ref 35.0–47.0)
Hemoglobin: 12.2 g/dL (ref 12.0–16.0)
MCH: 30.1 pg (ref 26.0–34.0)
MCHC: 33.3 g/dL (ref 32.0–36.0)
MCV: 90.5 fL (ref 80.0–100.0)
PLATELETS: 202 10*3/uL (ref 150–440)
RBC: 4.07 MIL/uL (ref 3.80–5.20)
RDW: 13.7 % (ref 11.5–14.5)
WBC: 9 10*3/uL (ref 3.6–11.0)

## 2015-06-22 LAB — URINE DRUG SCREEN, QUALITATIVE (ARMC ONLY)
Amphetamines, Ur Screen: NOT DETECTED
BARBITURATES, UR SCREEN: NOT DETECTED
Benzodiazepine, Ur Scrn: POSITIVE — AB
CANNABINOID 50 NG, UR ~~LOC~~: NOT DETECTED
COCAINE METABOLITE, UR ~~LOC~~: NOT DETECTED
MDMA (ECSTASY) UR SCREEN: NOT DETECTED
Methadone Scn, Ur: NOT DETECTED
Opiate, Ur Screen: NOT DETECTED
PHENCYCLIDINE (PCP) UR S: NOT DETECTED
TRICYCLIC, UR SCREEN: NOT DETECTED

## 2015-06-22 LAB — COMPREHENSIVE METABOLIC PANEL
ALK PHOS: 81 U/L (ref 38–126)
ALT: 10 U/L — AB (ref 14–54)
AST: 16 U/L (ref 15–41)
Albumin: 4.1 g/dL (ref 3.5–5.0)
Anion gap: 6 (ref 5–15)
BILIRUBIN TOTAL: 0.2 mg/dL — AB (ref 0.3–1.2)
BUN: 14 mg/dL (ref 6–20)
CALCIUM: 9.2 mg/dL (ref 8.9–10.3)
CHLORIDE: 108 mmol/L (ref 101–111)
CO2: 26 mmol/L (ref 22–32)
CREATININE: 0.82 mg/dL (ref 0.44–1.00)
Glucose, Bld: 89 mg/dL (ref 65–99)
Potassium: 3.6 mmol/L (ref 3.5–5.1)
Sodium: 140 mmol/L (ref 135–145)
Total Protein: 7 g/dL (ref 6.5–8.1)

## 2015-06-22 LAB — ACETAMINOPHEN LEVEL: Acetaminophen (Tylenol), Serum: <10 ug/mL — ABNORMAL LOW (ref 10–30)

## 2015-06-22 LAB — POCT PREGNANCY, URINE: PREG TEST UR: NEGATIVE

## 2015-06-22 LAB — SALICYLATE LEVEL

## 2015-06-22 LAB — ETHANOL

## 2015-06-22 NOTE — ED Notes (Signed)
Pt brought in by group home staff.  Pt reports hearing voices...telling pt to swallow razor blades.  Pt denies HI  Denies etoh or drug use.

## 2015-06-22 NOTE — Progress Notes (Signed)
Recreation Therapy Notes  INPATIENT RECREATION TR PLAN  Patient Details Name: SIERRAH LUEVANO MRN: [id number] DOB: 02/26/1991 Today's Date: 06/22/2015  Rec Therapy Plan Is patient appropriate for Therapeutic Recreation?: Yes Treatment times per week: At least once a week TR Treatment/Interventions: 1:1 session, Group participation (Comment) (Appropriate participation in daily recreation therapy tx)  Discharge Criteria Pt will be discharged from therapy if:: Treatment goals are met, Discharged Treatment plan/goals/alternatives discussed and agreed upon by:: Patient/family  Discharge Summary Short term goals set: See Care Plan Short term goals met: Complete Progress toward goals comments: One-to-one attended Which groups?: Goal setting, Leisure education, Self-esteem, Coping skills One-to-one attended: Decision Making, Self-expression Reason goals not met: N/A Therapeutic equipment acquired: None Reason patient discharged from therapy: Discharge from hospital Pt/family agrees with progress & goals achieved: Yes Date patient discharged from therapy: 06/19/15   Leonette Monarch, LRT/CTRS 06/22/2015, 2:05 PM

## 2015-06-23 DIAGNOSIS — F250 Schizoaffective disorder, bipolar type: Secondary | ICD-10-CM

## 2015-06-23 MED ORDER — VENLAFAXINE HCL ER 75 MG PO CP24
150.0000 mg | ORAL_CAPSULE | Freq: Every morning | ORAL | Status: DC
  Administered 2015-06-23 – 2015-06-24 (×2): 150 mg via ORAL
  Filled 2015-06-23 (×2): qty 2

## 2015-06-23 MED ORDER — LEVOTHYROXINE SODIUM 75 MCG PO TABS
75.0000 ug | ORAL_TABLET | Freq: Every day | ORAL | Status: DC
  Administered 2015-06-24: 75 ug via ORAL
  Filled 2015-06-23 (×2): qty 1

## 2015-06-23 MED ORDER — TEMAZEPAM 15 MG PO CAPS: 15.0000 mg | ORAL_CAPSULE | Freq: Every evening | ORAL | Status: DC | PRN

## 2015-06-23 MED ORDER — AMLODIPINE BESYLATE 5 MG PO TABS
2.5000 mg | ORAL_TABLET | Freq: Once | ORAL | Status: AC
  Administered 2015-06-23: 13:00:00 via ORAL
  Filled 2015-06-23: qty 1

## 2015-06-23 MED ORDER — FLUPHENAZINE HCL 5 MG PO TABS
5.0000 mg | ORAL_TABLET | Freq: Three times a day (TID) | ORAL | Status: DC
  Administered 2015-06-23 – 2015-06-24 (×4): 5 mg via ORAL
  Filled 2015-06-23 (×4): qty 1

## 2015-06-23 MED ORDER — BENZTROPINE MESYLATE 1 MG PO TABS
1.0000 mg | ORAL_TABLET | Freq: Every day | ORAL | Status: DC
  Administered 2015-06-23: 1 mg via ORAL
  Filled 2015-06-23: qty 1

## 2015-06-23 MED ORDER — ACETAMINOPHEN 325 MG PO TABS: 650.0000 mg | ORAL_TABLET | Freq: Four times a day (QID) | ORAL | Status: DC | PRN

## 2015-06-23 MED ORDER — OXCARBAZEPINE 300 MG PO TABS
150.0000 mg | ORAL_TABLET | Freq: Two times a day (BID) | ORAL | Status: DC
  Administered 2015-06-23 – 2015-06-24 (×3): 150 mg via ORAL
  Filled 2015-06-23: qty 1
  Filled 2015-06-23: qty 2
  Filled 2015-06-23 (×2): qty 1

## 2015-06-23 MED ORDER — LURASIDONE HCL 80 MG PO TABS
120.0000 mg | ORAL_TABLET | Freq: Every day | ORAL | Status: DC
  Administered 2015-06-24: 120 mg via ORAL
  Filled 2015-06-23: qty 2

## 2015-06-23 MED ORDER — DOCUSATE SODIUM 100 MG PO CAPS
100.0000 mg | ORAL_CAPSULE | Freq: Two times a day (BID) | ORAL | Status: DC
  Administered 2015-06-23 – 2015-06-24 (×3): 100 mg via ORAL
  Filled 2015-06-23 (×3): qty 1
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
## 2015-06-23 NOTE — ED Notes (Signed)
BEHAVIORAL HEALTH ROUNDING Patient sleeping: No. Patient alert and oriented: yes Behavior appropriate: Yes.  ; If no, describe:  Nutrition and fluids offered: yes Toileting and hygiene offered: Yes  Sitter present: q15 minute observations and security  monitoring Law enforcement present: Yes  ODS  

## 2015-06-23 NOTE — ED Notes (Signed)
BEHAVIORAL HEALTH ROUNDING Patient sleeping: Yes.   Patient alert and oriented: eyes closed  Appears asleep Behavior appropriate: Yes.  ; If no, describe:  Nutrition and fluids offered: Yes  Toileting and hygiene offered: sleeping Sitter present: q 15 minute observations and security monitoring Law enforcement present: yes  ODS 

## 2015-06-23 NOTE — ED Notes (Signed)
ENVIRONMENTAL ASSESSMENT Potentially harmful objects out of patient reach: Yes Personal belongings secured: Yes Patient dressed in hospital provided attire only: Yes Plastic bags out of patient reach: Yes Patient care equipment (cords, cables, call bells, lines, and drains) shortened, removed, or accounted for: Yes Equipment and supplies removed from bottom of stretcher: Yes Potentially toxic materials out of patient reach: Yes Sharps container removed or out of patient reach: Yes  Patient assigned to appropriate care area. Patient oriented to unit/care area: Informed that, for their safety, care areas are designed for safety and monitored by security cameras at all times; and visiting hours explained to patient. Patient verbalizes understanding, and verbal contract for safety obtained.  Patient reports auditory hallucinations telling her to eat "sharp things" but she has not acted upon this. She reports increased stress regarding some changes in family dynamics. Taking all medications as ordered.

## 2015-06-23 NOTE — ED Notes (Signed)
Patient asleep in room. No noted distress or abnormal behavior. Will continue 15 minute checks and observation by security cameras for safety. 

## 2015-06-23 NOTE — Progress Notes (Signed)
LCSW met with patient briefly she was resting and dinner was brought in. Called her Mother [phone] ( time difference) it rang but there was no answer, unable to leave message. Called Group Home Provider [phone] Allene Pyo left a message requesting a call back  FL2 started.

## 2015-06-23 NOTE — ED Notes (Signed)
Report given to Ernestine Langworthy H in BHU    Pt to transfer at this time

## 2015-06-23 NOTE — ED Notes (Signed)
ED BHU [place] Is the patient under IVC or is there intent for IVC: no   Is the patient medically cleared: Yes.   Is there vacancy in the ED BHU: Yes.   Is the population mix appropriate for patient: Yes.   Is the patient awaiting placement in inpatient or outpatient setting: Yes.   Has the patient had a psychiatric consult: Yes.   Survey of unit performed for contraband, proper placement and condition of furniture, tampering with fixtures in bathroom, shower, and each patient room: Yes.  ; Findings:  APPEARANCE/BEHAVIOR Calm and cooperative NEURO ASSESSMENT Orientation: oriented x3  Denies pain Hallucinations:  Positive visual  (Hallucinations) Speech: Normal Gait: normal RESPIRATORY ASSESSMENT Even  Unlabored respirations  CARDIOVASCULAR ASSESSMENT Pulses equal   regular rate  Skin warm and dry   GASTROINTESTINAL ASSESSMENT no GI complaint EXTREMITIES Full ROM  PLAN OF CARE Provide calm/safe environment. Vital signs assessed twice daily. ED BHU Assessment once each 12-hour shift. Collaborate with intake RN daily or as condition indicates. Assure the ED provider has rounded once each shift. Provide and encourage hygiene. Provide redirection as needed. Assess for escalating behavior; address immediately and inform ED provider.  Assess family dynamic and appropriateness for visitation as needed: Yes.  ; If necessary, describe findings:  Educate the patient/family about BHU procedures/visitation: Yes.  ; If necessary, describe findings:

## 2015-06-23 NOTE — ED Notes (Signed)
Lunch provided.

## 2015-06-23 NOTE — Consult Note (Signed)
[facility] Consult   Reason for Consult:  Auditory command hallucinations. Referring Physician:  Dr. Kerman Passey Patient Identification: Melanie Bell MRN:  [id number] Principal Diagnosis: <principal problem not specified> Diagnosis:   Patient Active Problem List   Diagnosis Date Noted  . Suicidal ideation [R45.851] 06/15/2015  . Schizoaffective disorder, bipolar type ([place]) [F25.0] 11/06/2014  . Tardive dyskinesia [G24.01] 10/06/2014  . Tobacco use disorder [F17.200] 09/30/2014  . Hypothyroidism [E03.9] 09/29/2014  . Hypertension [I10] 09/29/2014  . Constipation [K59.00] 09/29/2014    Total Time spent with patient: 1 hour  Subjective:   Melanie Bell is a 25 y.o. female patient admitted with auditory hallucinations commanding her to swallow blades.  HPI:    Identifying data. [name] is a 25 year old female with history of depression, anxiety, psychosis, mood instability, and self-injurious behavior.  Chief complaint. "The voices are worse."  History of present illness. Information was obtained from the patient and the chart. The patient has a long history of schizoaffective disorder and self-injurious behavior with repeated swallowing of foreign sharp objects. She has been hospitalized in psychiatry and on medicine multiple times. She has not swallow any objects in the past 7 or 8 months. She now recognizes her symptoms and is able to come to the hospital asking for help. She was just discharged from our hospital after 6 days of hospitalization for worsening of auditory hallucinations commanding her to kill herself but not to swallow. We increased 2 of her antipsychotics Prolixin as well Latuda with initial improvement. Just a few days later the patient returns to the emergency room complaining of worsening of her symptoms. She's not able to identify any stressors except for the fact that her mother who is the guardian recently relocated [place] and is less  available now. Her mother complains that oftentimes we are symptoms of patient's manipulation. She is against frequent psychiatric hospitalizations. Her group home staff believes that the patient comes to the hospital to "drink sodas". The patient denies any symptoms of depression or hypomania anxiety. There are no symptoms suggestive of bipolar mania. She has been compliant with medications as she always is. She does not use substances.  Past psychiatric history. Multiple hospitalizations for psychosis, depression, suicidal ideation aside attempts. She's been tried on numerous medications but believes that her current combination of Effexor, Latuda, and Prolixin has been working well for her. She is in the care of Dr. Dorann Ou at [facility].  Family psychiatric history. Mother with depression and anxiety.  Social history. She is disabled from mental illness. She has been living in the same group home for 6 years. She likes the group home.  Risk to Self: Suicidal Ideation: No Suicidal Intent: No Is patient at risk for suicide?: No Suicidal Plan?: No-Not Currently/Within Last 6 Months Access to Means: No What has been your use of drugs/alcohol within the last 12 months?: denied use How many times?:  ("Too Many") Other Self Harm Risks: denied Triggers for Past Attempts: Hallucinations, Family contact, Other personal contacts Intentional Self Injurious Behavior: None Risk to Others: Homicidal Ideation: No Thoughts of Harm to Others: No Current Homicidal Intent: No Current Homicidal Plan: No Access to Homicidal Means: No Identified Victim: Denied History of harm to others?: No Assessment of Violence: None Noted Violent Behavior Description: denied Does patient have access to weapons?: No Criminal Charges Pending?: No Does patient have a court date: No Prior Inpatient Therapy: Prior Inpatient Therapy: Yes Prior Therapy Dates: 07/16, 06/16, 04/16, 04/15, 08/14, 1/17 Prior Therapy  Facilty/Provider(s):  [facility], [place], [place] Reason for Treatment: Schizophrenia Prior Outpatient Therapy: Prior Outpatient Therapy: Yes Prior Therapy Dates: Current' Prior Therapy Facilty/Provider(s): [facility] Reason for Treatment: Schizophrenia  Does patient have an ACCT team?: No Does patient have Intensive In-House Services?  : No Does patient have [facility] services? : No Does patient have P4CC services?: No  Past Medical History:  Past Medical History  Diagnosis Date  . Hallucinations 09/30/2014    Sizoaffective  . Depression   . Anxiety   . Hypertension   . Tardive dyskinesia 10/2014    recent onset  . GERD (gastroesophageal reflux disease)   . Hyperlipidemia     Past Surgical History  Procedure Laterality Date  . Wisdom tooth extraction    . Esophagogastroduodenoscopy N/A 11/28/2014    Procedure: ESOPHAGOGASTRODUODENOSCOPY (EGD);  Surgeon: [name], MD;  Location: [facility] ENDOSCOPY;  Service: Endoscopy;  Laterality: N/A;  . Abdominal surgery      "years ago" to remove foreign objects  . Breast lumpectomy     Family History:  Family History  Problem Relation Age of Onset  . Depression Mother   . Hypertension Mother    Family Psychiatric  History: Depression and anxiety. Social History:  History  Alcohol Use No     History  Drug Use No    Social History   Social History  . Marital Status: Single    Spouse Name: N/A  . Number of Children: N/A  . Years of Education: N/A   Social History Main Topics  . Smoking status: Current Every Day Smoker -- 0.50 packs/day for 3 years    Types: Cigarettes  . Smokeless tobacco: Never Used  . Alcohol Use: No  . Drug Use: No  . Sexual Activity: No   Other Topics Concern  . None   Social History Narrative   Additional Social History:    Allergies:   Allergies  Allergen Reactions  . Betadine [Povidone Iodine] Other (See Comments)    Reaction:  Unknown   . Shellfish-Derived Products Other (See Comments)     Reaction:  Unknown   . Iodine Rash    Labs:  Results for orders placed or performed during the hospital encounter of 06/22/15 (from the past 48 hour(s))  Urine Drug Screen, Qualitative ([place] only)     Status: Abnormal   Collection Time: 06/22/15  8:35 PM  Result Value Ref Range   Tricyclic, Ur Screen NONE DETECTED NONE DETECTED   Amphetamines, Ur Screen NONE DETECTED NONE DETECTED   MDMA (Ecstasy)Ur Screen NONE DETECTED NONE DETECTED   Cocaine Metabolite,Ur [place] NONE DETECTED NONE DETECTED   Opiate, Ur Screen NONE DETECTED NONE DETECTED   Phencyclidine (PCP) Ur S NONE DETECTED NONE DETECTED   Cannabinoid 50 Ng, Ur [place] NONE DETECTED NONE DETECTED   Barbiturates, Ur Screen NONE DETECTED NONE DETECTED   Benzodiazepine, Ur Scrn POSITIVE (A) NONE DETECTED   Methadone Scn, Ur NONE DETECTED NONE DETECTED    Comment: (NOTE) 169  Tricyclics, urine               Cutoff 1000 ng/mL 200  Amphetamines, urine             Cutoff 1000 ng/mL 300  MDMA (Ecstasy), urine           Cutoff 500 ng/mL 400  Cocaine Metabolite, urine       Cutoff 300 ng/mL 500  Opiate, urine  Cutoff 300 ng/mL 600  Phencyclidine (PCP), urine      Cutoff 25 ng/mL 700  Cannabinoid, urine              Cutoff 50 ng/mL 800  Barbiturates, urine             Cutoff 200 ng/mL 900  Benzodiazepine, urine           Cutoff 200 ng/mL 1000 Methadone, urine                Cutoff 300 ng/mL 1100 1200 The urine drug screen provides only a preliminary, unconfirmed 1300 analytical test result and should not be used for non-medical 1400 purposes. Clinical consideration and professional judgment should 1500 be applied to any positive drug screen result due to possible 1600 interfering substances. A more specific alternate chemical method 1700 must be used in order to obtain a confirmed analytical result.  1800 Gas chromato graphy / mass spectrometry (GC/MS) is the preferred 1900 confirmatory method.   Pregnancy, urine POC      Status: None   Collection Time: 06/22/15  8:39 PM  Result Value Ref Range   Preg Test, Ur NEGATIVE NEGATIVE    Comment:        THE SENSITIVITY OF THIS METHODOLOGY IS >24 mIU/mL   Comprehensive metabolic panel     Status: Abnormal   Collection Time: 06/22/15  8:55 PM  Result Value Ref Range   Sodium 140 135 - 145 mmol/L   Potassium 3.6 3.5 - 5.1 mmol/L   Chloride 108 101 - 111 mmol/L   CO2 26 22 - 32 mmol/L   Glucose, Bld 89 65 - 99 mg/dL   BUN 14 6 - 20 mg/dL   Creatinine, Ser 0.82 0.44 - 1.00 mg/dL   Calcium 9.2 8.9 - 10.3 mg/dL   Total Protein 7.0 6.5 - 8.1 g/dL   Albumin 4.1 3.5 - 5.0 g/dL   AST 16 15 - 41 U/L   ALT 10 (L) 14 - 54 U/L   Alkaline Phosphatase 81 38 - 126 U/L   Total Bilirubin 0.2 (L) 0.3 - 1.2 mg/dL   GFR calc non Af Amer >60 >60 mL/min   GFR calc Af Amer >60 >60 mL/min    Comment: (NOTE) The eGFR has been calculated using the CKD EPI equation. This calculation has not been validated in all clinical situations. eGFR's persistently <60 mL/min signify possible Chronic Kidney Disease.    Anion gap 6 5 - 15  Ethanol (ETOH)     Status: None   Collection Time: 06/22/15  8:55 PM  Result Value Ref Range   Alcohol, Ethyl (B) <5 <5 mg/dL    Comment:        LOWEST DETECTABLE LIMIT FOR SERUM ALCOHOL IS 5 mg/dL FOR MEDICAL PURPOSES ONLY   Salicylate level     Status: None   Collection Time: 06/22/15  8:55 PM  Result Value Ref Range   Salicylate Lvl <7.7 2.8 - 30.0 mg/dL  Acetaminophen level     Status: Abnormal   Collection Time: 06/22/15  8:55 PM  Result Value Ref Range   Acetaminophen (Tylenol), Serum <10 (L) 10 - 30 ug/mL    Comment:        THERAPEUTIC CONCENTRATIONS VARY SIGNIFICANTLY. A RANGE OF 10-30 ug/mL MAY BE AN EFFECTIVE CONCENTRATION FOR MANY PATIENTS. HOWEVER, SOME ARE BEST TREATED AT CONCENTRATIONS OUTSIDE THIS RANGE. ACETAMINOPHEN CONCENTRATIONS >150 ug/mL AT 4 HOURS AFTER INGESTION AND >50 ug/mL AT 12 HOURS AFTER  INGESTION ARE OFTEN  ASSOCIATED WITH TOXIC REACTIONS.   CBC     Status: None   Collection Time: 06/22/15  8:55 PM  Result Value Ref Range   WBC 9.0 3.6 - 11.0 K/uL   RBC 4.07 3.80 - 5.20 MIL/uL   Hemoglobin 12.2 12.0 - 16.0 g/dL   HCT 36.8 35.0 - 47.0 %   MCV 90.5 80.0 - 100.0 fL   MCH 30.1 26.0 - 34.0 pg   MCHC 33.3 32.0 - 36.0 g/dL   RDW 13.7 11.5 - 14.5 %   Platelets 202 150 - 440 K/uL    Current Facility-Administered Medications  Medication Dose Route Frequency Provider Last Rate Last Dose  . acetaminophen (TYLENOL) tablet 650 mg  650 mg Oral Q6H PRN Harvest Dark, MD      . benztropine (COGENTIN) tablet 1 mg  1 mg Oral QHS Harvest Dark, MD      . docusate sodium (COLACE) capsule 100 mg  100 mg Oral BID Harvest Dark, MD   100 mg at 06/23/15 1300  . fluPHENAZine (PROLIXIN) tablet 5 mg  5 mg Oral 3 times per day Harvest Dark, MD   5 mg at 06/23/15 1301  . [START ON 06/24/2015] levothyroxine (SYNTHROID, LEVOTHROID) tablet 75 mcg  75 mcg Oral QAC breakfast Harvest Dark, MD      . Derrill Memo ON 06/24/2015] lurasidone (LATUDA) tablet 120 mg  120 mg Oral Q breakfast Harvest Dark, MD      . OXcarbazepine (TRILEPTAL) tablet 150 mg  150 mg Oral BID Harvest Dark, MD   150 mg at 06/23/15 1301  . venlafaxine XR (EFFEXOR-XR) 24 hr capsule 150 mg  150 mg Oral q morning - 10a Harvest Dark, MD   150 mg at 06/23/15 1313   Current Outpatient Prescriptions  Medication Sig Dispense Refill  . acetaminophen (TYLENOL) 325 MG tablet Take 2 tablets (650 mg total) by mouth every 6 (six) hours as needed for mild pain. 30 tablet 0  . alum & mag hydroxide-simeth (MAALOX/MYLANTA) 200-200-20 MG/5ML suspension Take 30 mLs by mouth every 4 (four) hours as needed for indigestion. 355 mL 0  . amLODipine (NORVASC) 2.5 MG tablet Take 1 tablet (2.5 mg total) by mouth daily. 30 tablet 0  . benztropine (COGENTIN) 1 MG tablet Take 1 tablet (1 mg total) by mouth at bedtime. 30 tablet 0  . docusate sodium  (COLACE) 100 MG capsule Take 2 capsules (200 mg total) by mouth 2 (two) times daily. 120 capsule 0  . fluPHENAZine (PROLIXIN) 5 MG tablet Take 1 tablet (5 mg total) by mouth 3 (three) times daily. 90 tablet 0  . levothyroxine (SYNTHROID, LEVOTHROID) 75 MCG tablet Take 1 tablet (75 mcg total) by mouth daily before breakfast. 30 tablet 0  . Lurasidone HCl 120 MG TABS Take 1 tablet (120 mg total) by mouth daily with supper. 30 tablet 0  . OXcarbazepine (TRILEPTAL) 150 MG tablet Take 1 tablet (150 mg total) by mouth 2 (two) times daily. 60 tablet 0  . traZODone (DESYREL) 50 MG tablet Take 1 tablet (50 mg total) by mouth at bedtime. 30 tablet 0  . venlafaxine XR (EFFEXOR-XR) 150 MG 24 hr capsule Take 1 capsule (150 mg total) by mouth daily with breakfast. 30 capsule 0  . ibuprofen (ADVIL,MOTRIN) 800 MG tablet Take 800 mg by mouth every 8 (eight) hours as needed.    . temazepam (RESTORIL) 15 MG capsule Take 1 capsule (15 mg total) by mouth at bedtime as needed for sleep. (  Patient not taking: Reported on 06/22/2015) 30 capsule 0    Musculoskeletal: Strength & Muscle Tone: within normal limits Gait & Station: normal Patient leans: N/A  Psychiatric Specialty Exam: Review of Systems  All other systems reviewed and are negative.   Blood pressure 121/80, pulse 80, temperature 97.9 F (36.6 C), temperature source Oral, resp. rate 20, height 5' 6"  (1.676 m), weight 87.091 kg (192 lb), last menstrual period 05/04/2015, SpO2 98 %.Body mass index is 31 kg/(m^2).  General Appearance: Casual  Eye Contact::  Good  Speech:  Clear and Coherent  Volume:  Normal  Mood:  Euthymic  Affect:  Blunt  Thought Process:  Goal Directed  Orientation:  Full (Time, Place, and Person)  Thought Content:  Hallucinations: Command:  Commanding her to swallow blades.  Suicidal Thoughts:  No  Homicidal Thoughts:  No  Memory:  Immediate;   Fair Recent;   Fair Remote;   Fair  Judgement:  Poor  Insight:  Lacking  Psychomotor  Activity:  Normal  Concentration:  Fair  Recall:  [employer] of Knowledge:Fair  Language: Fair  Akathisia:  No  Handed:  Right  AIMS (if indicated):     Assets:  Communication Skills Desire for Improvement Financial Resources/Insurance Housing Physical Health Resilience Social Support  ADL's:  Intact  Cognition: WNL  Sleep:      Treatment Plan Summary: Daily contact with patient to assess and evaluate symptoms and progress in treatment and Medication management   [name] is an 25 year old female with history of depression, anxiety, psychosis, and self-injurious behaviors brought to the emergency room for complaints of worsening of auditory hallucinations commanding her to swallow blades.  1. We will continue all medications as prescribed in the community.  2. We will contact her mother, the guardian to see her input.  3. The patient is allowed to return to her group home when stable.  4. I will follow up  Disposition: No evidence of imminent risk to self or others at present.   Supportive therapy provided about ongoing stressors. Discussed crisis plan, support from social network, calling 911, coming to the Emergency Department, and calling Suicide Hotline.  Orson Slick, MD 06/23/2015 5:16 PM

## 2015-06-23 NOTE — ED Notes (Signed)

## 2015-06-23 NOTE — ED Notes (Signed)
Pt is awake in bed this evening. Pt mood is depressed and her affect is appropriate. Pt endorses passive SI and reports command auditory hallucinations telling her to self harm. Pt has no plan and contracts for safety at this time. Writer discussed treatment plan and encouraged rest. Pt states that she likes her current group home, but just wants "the voices and thoughts to stop." Pt reports that her medications are not working. Pt showered tonight and is pleasant and cooperative with staff. Writer provided food and drink. 15 minute checks are ongoing for safety.

## 2015-06-23 NOTE — NC FL2 (Cosign Needed)
[place] LEVEL OF CARE SCREENING TOOL     IDENTIFICATION  Patient [name] Birthdate: 1990-06-11 Sex: female Admission Date (Current Location): 06/22/2015  [place] and [place] Number:  [name] [id number] [place] and Address:  [facility], [address], [place], [place] [address]      Provider Number: [id number]  Attending Physician Name and Address:  No att. providers found  Relative Name and Phone Number:       Current Level of Care: Hospital Recommended Level of Care: Other (Comment) (Group Home) Prior Approval Number:    Date Approved/Denied:   PASRR Number:    Discharge Plan: Other (Comment) [employer] Group home)  [phone]    Current Diagnoses: Patient Active Problem List   Diagnosis Date Noted  . Suicidal ideation 06/15/2015  . Schizoaffective disorder, bipolar type ([place]) 11/06/2014  . Tardive dyskinesia 10/06/2014  . Tobacco use disorder 09/30/2014  . Hypothyroidism 09/29/2014  . Hypertension 09/29/2014  . Constipation 09/29/2014    Orientation RESPIRATION BLADDER Height & Weight     Self, Time, Situation, Place  Normal Continent Weight: 192 lb (87.091 kg) Height:  5\' 6"  (167.6 cm)  BEHAVIORAL SYMPTOMS/MOOD NEUROLOGICAL BOWEL NUTRITION STATUS  Dangerous to self, others or property   Continent Diet (Normal)  AMBULATORY STATUS COMMUNICATION OF NEEDS Skin   Independent Verbally Normal                       Personal Care Assistance Level of Assistance  Bathing, Feeding, Dressing Bathing Assistance: Independent Feeding assistance: Independent Dressing Assistance: Independent     Functional Limitations Info  Sight, Hearing, Speech Sight Info: Adequate Hearing Info: Adequate Speech Info: Adequate    SPECIAL CARE FACTORS FREQUENCY                       Contractures      Additional Factors Info  Psychotropic (Patient will ingest things orally- paper clips,  pennys,dangerous sharp objects)               Current Medications (06/23/2015):  This is the current hospital active medication list Current Facility-Administered Medications  Medication Dose Route Frequency Provider Last Rate Last Dose  . acetaminophen (TYLENOL) tablet 650 mg  650 mg Oral Q6H PRN Harvest Dark, MD      . benztropine (COGENTIN) tablet 1 mg  1 mg Oral QHS Harvest Dark, MD      . docusate sodium (COLACE) capsule 100 mg  100 mg Oral BID Harvest Dark, MD   100 mg at 06/23/15 1300  . fluPHENAZine (PROLIXIN) tablet 5 mg  5 mg Oral 3 times per day Harvest Dark, MD   5 mg at 06/23/15 1301  . [START ON 06/24/2015] levothyroxine (SYNTHROID, LEVOTHROID) tablet 75 mcg  75 mcg Oral QAC breakfast Harvest Dark, MD      . Derrill Memo ON 06/24/2015] lurasidone (LATUDA) tablet 120 mg  120 mg Oral Q breakfast Harvest Dark, MD      . OXcarbazepine (TRILEPTAL) tablet 150 mg  150 mg Oral BID Harvest Dark, MD   150 mg at 06/23/15 1301  . venlafaxine XR (EFFEXOR-XR) 24 hr capsule 150 mg  150 mg Oral q morning - 10a Harvest Dark, MD   150 mg at 06/23/15 1313   Current Outpatient Prescriptions  Medication Sig Dispense Refill  . acetaminophen (TYLENOL) 325 MG tablet Take 2 tablets (650 mg total) by mouth every 6 (six) hours as needed for  mild pain. 30 tablet 0  . alum & mag hydroxide-simeth (MAALOX/MYLANTA) 200-200-20 MG/5ML suspension Take 30 mLs by mouth every 4 (four) hours as needed for indigestion. 355 mL 0  . amLODipine (NORVASC) 2.5 MG tablet Take 1 tablet (2.5 mg total) by mouth daily. 30 tablet 0  . benztropine (COGENTIN) 1 MG tablet Take 1 tablet (1 mg total) by mouth at bedtime. 30 tablet 0  . docusate sodium (COLACE) 100 MG capsule Take 2 capsules (200 mg total) by mouth 2 (two) times daily. 120 capsule 0  . fluPHENAZine (PROLIXIN) 5 MG tablet Take 1 tablet (5 mg total) by mouth 3 (three) times daily. 90 tablet 0  . levothyroxine (SYNTHROID, LEVOTHROID) 75 MCG  tablet Take 1 tablet (75 mcg total) by mouth daily before breakfast. 30 tablet 0  . Lurasidone HCl 120 MG TABS Take 1 tablet (120 mg total) by mouth daily with supper. 30 tablet 0  . OXcarbazepine (TRILEPTAL) 150 MG tablet Take 1 tablet (150 mg total) by mouth 2 (two) times daily. 60 tablet 0  . traZODone (DESYREL) 50 MG tablet Take 1 tablet (50 mg total) by mouth at bedtime. 30 tablet 0  . venlafaxine XR (EFFEXOR-XR) 150 MG 24 hr capsule Take 1 capsule (150 mg total) by mouth daily with breakfast. 30 capsule 0  . ibuprofen (ADVIL,MOTRIN) 800 MG tablet Take 800 mg by mouth every 8 (eight) hours as needed.    . temazepam (RESTORIL) 15 MG capsule Take 1 capsule (15 mg total) by mouth at bedtime as needed for sleep. (Patient not taking: Reported on 06/22/2015) 30 capsule 0     Discharge Medications: Please see discharge summary for a list of discharge medications.  Relevant Imaging Results:  Relevant Lab Results:   Additional Information Allergies: Betadine, Shell fish derived products Iodine  SSN # [id number]  Thurston Brendlinger, Glennon Hamilton, LCSW

## 2015-06-23 NOTE — ED Provider Notes (Signed)
[facility] Emergency Department Provider Note  ____________________________________________  Time seen: Approximately 0028 AM  I have reviewed the triage vital signs and the nursing notes.   HISTORY  Chief Complaint Behavior Problem    HPI Melanie Bell is a 25 y.o. female who comes into the hospital today with auditory hallucinations. The patient reports that she started hearing voices telling her to kill herself. The patient reports that this is been going on for the past week. The patient has been taking her medicine and has heard voices before. She denies any inciting events like stress or new Addison's or drugs to cause the symptoms. She reports that the voices are telling her to swallow a razor blades. She has not been told to hurt others a thought about hurting others. The patient does feel anxious and was seen here recently with anxiety. She denies any drinking or drugs no nausea vomiting chest pain or abdominal pain. The patient is here to be evaluated for these symptoms.   Past Medical History  Diagnosis Date  . Hallucinations 09/30/2014    Sizoaffective  . Depression   . Anxiety   . Hypertension   . Tardive dyskinesia 10/2014    recent onset  . GERD (gastroesophageal reflux disease)   . Hyperlipidemia     Patient Active Problem List   Diagnosis Date Noted  . Suicidal ideation 06/15/2015  . Schizoaffective disorder, bipolar type ([place]) 11/06/2014  . Tardive dyskinesia 10/06/2014  . Tobacco use disorder 09/30/2014  . Hypothyroidism 09/29/2014  . Hypertension 09/29/2014  . Constipation 09/29/2014    Past Surgical History  Procedure Laterality Date  . Wisdom tooth extraction    . Esophagogastroduodenoscopy N/A 11/28/2014    Procedure: ESOPHAGOGASTRODUODENOSCOPY (EGD);  Surgeon: [name], MD;  Location: [facility] ENDOSCOPY;  Service: Endoscopy;  Laterality: N/A;  . Abdominal surgery      "years ago" to remove foreign objects  .  Breast lumpectomy      Current Outpatient Rx  Name  Route  Sig  Dispense  Refill  . acetaminophen (TYLENOL) 325 MG tablet   Oral   Take 2 tablets (650 mg total) by mouth every 6 (six) hours as needed for mild pain.   30 tablet   0   . alum & mag hydroxide-simeth (MAALOX/MYLANTA) 200-200-20 MG/5ML suspension   Oral   Take 30 mLs by mouth every 4 (four) hours as needed for indigestion.   355 mL   0   . amLODipine (NORVASC) 2.5 MG tablet   Oral   Take 1 tablet (2.5 mg total) by mouth daily.   30 tablet   0   . benztropine (COGENTIN) 1 MG tablet   Oral   Take 1 tablet (1 mg total) by mouth at bedtime.   30 tablet   0   . docusate sodium (COLACE) 100 MG capsule   Oral   Take 2 capsules (200 mg total) by mouth 2 (two) times daily.   120 capsule   0   . fluPHENAZine (PROLIXIN) 5 MG tablet   Oral   Take 1 tablet (5 mg total) by mouth 3 (three) times daily.   90 tablet   0   . levothyroxine (SYNTHROID, LEVOTHROID) 75 MCG tablet   Oral   Take 1 tablet (75 mcg total) by mouth daily before breakfast.   30 tablet   0   . Lurasidone HCl 120 MG TABS   Oral   Take 1 tablet (120 mg total) by  mouth daily with supper.   30 tablet   0   . OXcarbazepine (TRILEPTAL) 150 MG tablet   Oral   Take 1 tablet (150 mg total) by mouth 2 (two) times daily.   60 tablet   0   . traZODone (DESYREL) 50 MG tablet   Oral   Take 1 tablet (50 mg total) by mouth at bedtime.   30 tablet   0   . venlafaxine XR (EFFEXOR-XR) 150 MG 24 hr capsule   Oral   Take 1 capsule (150 mg total) by mouth daily with breakfast.   30 capsule   0   . ibuprofen (ADVIL,MOTRIN) 800 MG tablet   Oral   Take 800 mg by mouth every 8 (eight) hours as needed.         . temazepam (RESTORIL) 15 MG capsule   Oral   Take 1 capsule (15 mg total) by mouth at bedtime as needed for sleep. Patient not taking: Reported on 06/22/2015   30 capsule   0     Allergies Betadine; Shellfish-derived products; and  Iodine  Family History  Problem Relation Age of Onset  . Depression Mother   . Hypertension Mother     Social History Social History  Substance Use Topics  . Smoking status: Current Every Day Smoker -- 0.50 packs/day for 3 years    Types: Cigarettes  . Smokeless tobacco: Never Used  . Alcohol Use: No    Review of Systems Constitutional: No fever/chills Eyes: No visual changes. ENT: No sore throat. Cardiovascular: Denies chest pain. Respiratory: Denies shortness of breath. Gastrointestinal: No abdominal pain.  No nausea, no vomiting.  No diarrhea.  No constipation. Genitourinary: Negative for dysuria. Musculoskeletal: Negative for back pain. Skin: Negative for rash. Neurological: Negative for headaches, focal weakness or numbness. Psych: Auditory Hallucinations  10-point ROS otherwise negative.  ____________________________________________   PHYSICAL EXAM:  VITAL SIGNS: ED Triage Vitals  Enc Vitals Group     BP 06/22/15 2024 111/51 mmHg     Pulse Rate 06/22/15 2024 77     Resp 06/22/15 2024 18     Temp 06/22/15 2024 98.5 F (36.9 C)     Temp Source 06/22/15 2024 Oral     SpO2 06/22/15 2024 100 %     Weight 06/22/15 2024 192 lb (87.091 kg)     Height 06/22/15 2024 5\' 6"  (1.676 m)     Head Cir --      Peak Flow --      Pain Score 06/23/15 0023 0     Pain Loc --      Pain Edu? --      Excl. in [name]? --     Constitutional: Alert and oriented. Well appearing and in no acute distress. Eyes: Conjunctivae are normal. PERRL. EOMI. Head: Atraumatic. Nose: No congestion/rhinnorhea. Mouth/Throat: Mucous membranes are moist.  Oropharynx non-erythematous. Cardiovascular: Normal rate, regular rhythm. Grossly normal heart sounds.  Good peripheral circulation. Respiratory: Normal respiratory effort.  No retractions. Lungs CTAB. Gastrointestinal: Soft and nontender. No distention.positive bowel sounds Musculoskeletal: No lower extremity tenderness nor edema.  No joint  effusions. Neurologic:  Normal speech and language.  Skin:  Skin is warm, dry and intact. Psychiatric: Mood and affect are normal.   ____________________________________________   LABS (all labs ordered are listed, but only abnormal results are displayed)  Labs Reviewed  COMPREHENSIVE METABOLIC PANEL - Abnormal; Notable for the following:    ALT 10 (*)    Total Bilirubin 0.2 (*)  All other components within normal limits  ACETAMINOPHEN LEVEL - Abnormal; Notable for the following:    Acetaminophen (Tylenol), Serum <10 (*)    All other components within normal limits  URINE DRUG SCREEN, QUALITATIVE ([facility] ONLY) - Abnormal; Notable for the following:    Benzodiazepine, Ur Scrn POSITIVE (*)    All other components within normal limits  ETHANOL  SALICYLATE LEVEL  CBC  POC URINE PREG, ED  POCT PREGNANCY, URINE   ____________________________________________  EKG  none ____________________________________________  RADIOLOGY  none ____________________________________________   PROCEDURES  Procedure(s) performed: None  Critical Care performed: No  ____________________________________________   INITIAL IMPRESSION / ASSESSMENT AND PLAN / ED COURSE  Pertinent labs & imaging results that were available during my care of the patient were reviewed by me and considered in my medical decision making (see chart for details).  This is a 55 her old female with a history of schizoaffective disorder who comes in today with some auditory hallucinations telling her to kill herself by swallowing razor blades. The patient reports that she has not been having constant voices but that these have been going on for the past week. The patient is here for evaluation and help. I will have the patient evaluated by psych to determine if she needs to change her medications or an increase in her medications to help with her symptoms. The patient will be seen by  psych. ____________________________________________   FINAL CLINICAL IMPRESSION(S) / ED DIAGNOSES  Final diagnoses:  Auditory hallucinations      Loney Hering, MD 06/23/15 [phone]

## 2015-06-23 NOTE — ED Notes (Signed)
Breakfast provided  Patient observed lying in bed with eyes closed  Even, unlabored respirations observed   NAD pt appears to be sleeping  I will continue to monitor along with every 15 minute visual observations and ongoing security monitoring

## 2015-06-23 NOTE — ED Notes (Signed)
Patient is calm and quiet. Cooperative with taking all medications.Maintained on 15 minute checks and observation by security camera for safety.

## 2015-06-23 NOTE — ED Notes (Signed)
Patient observed lying in bed with eyes closed  Even, unlabored respirations observed   NAD pt appears to be sleeping  I will continue to monitor along with every 15 minute visual observations and ongoing security monitoring    

## 2015-06-24 DIAGNOSIS — F250 Schizoaffective disorder, bipolar type: Secondary | ICD-10-CM | POA: Diagnosis not present

## 2015-06-24 NOTE — ED Provider Notes (Signed)
Patient's distress, remains medically stable for psychiatric disposition Filed Vitals:   06/23/15 0945 06/23/15 1934  BP: 121/80 121/70  Pulse: 80 88  Temp: 97.9 F (36.6 C) 98.6 F (37 C)  Resp: 20 18     Earleen Newport, MD 06/24/15 [phone]

## 2015-06-24 NOTE — Progress Notes (Signed)
LCSW attempted to call, Mrs Rolena Infante ( [phone] left detailed message,requested a call back  [facility] [phone] Called and left message requesting a call back  LCSW awaiting call back.

## 2015-06-24 NOTE — ED Notes (Signed)
Patient escorted to lobby with follow-up instructions.  All personal belongings returned.  Denies SI/HI/AVH.  Caregiver verbalized f/u instructions.  NAD.

## 2015-06-24 NOTE — ED Notes (Signed)
Patient resting quietly in room. No noted distress or abnormal behaviors noted. Will continue 15 minute checks and observation by security camera for safety. 

## 2015-06-24 NOTE — Consult Note (Signed)
[facility] Consult   Reason for Consult:  Consult for 25 year old woman with history of schizoaffective disorder and repeated self injury who is in the emergency room because of hallucinations Referring Physician:  Jimmye Norman Patient Identification: Melanie Bell MRN:  [id number] Principal Diagnosis: Schizoaffective disorder, bipolar type [facility]) Diagnosis:   Patient Active Problem List   Diagnosis Date Noted  . Suicidal ideation [R45.851] 06/15/2015  . Schizoaffective disorder, bipolar type ([place]) [F25.0] 11/06/2014  . Tardive dyskinesia [G24.01] 10/06/2014  . Tobacco use disorder [F17.200] 09/30/2014  . Hypothyroidism [E03.9] 09/29/2014  . Hypertension [I10] 09/29/2014  . Constipation [K59.00] 09/29/2014    Total Time spent with patient: 30 minutes  Subjective:   Melanie Bell is a 25 y.o. female patient admitted with "I'm hearing voices".  HPI:  Follow-up for this 25 year old woman with a history of chronic mental health problems. Frequent visits to the emergency room. Patient says that for the last couple weeks her voices have been worse. They happen intermittently through the day. Sometimes they will tell her that she ought to swallow a sharp object. She has refrained from doing so and has no wish or desire to hurt her self and has good insight into the unreality of the voices. She has some trouble sleeping at night and her nerves are worse. Biggest source of immediate stress is that her mother has moved to [place] which upsets her.  Social history: Patient resides at a group home. She gets upset there and frequently acts out either by swallowing some kind of foreign body or more recently just by asking to come to the emergency room. Her mother is her closest relative. Mother used to live in [place] which was bad enough but now that she's moved to [place] the patient really feels abandoned.  Abuse history: No history of alcohol or drug abuse she does smoke  cigarettes.  Medical history: She's had multiple surgeries and interventions for swallowing open safety pins and other dangerous objects although she is not doing that right now. Has a history of high blood pressure and hypothyroidism.  Past Psychiatric History: Long history of mental health problems going back years multiple behavior problems. History of head banging as a child. Lots of self injury type behavior. Currently seen at [facility] and does take her medicine and feels like it has been helpful.  Risk to Self: Suicidal Ideation: No Suicidal Intent: No Is patient at risk for suicide?: No Suicidal Plan?: No-Not Currently/Within Last 6 Months Access to Means: No What has been your use of drugs/alcohol within the last 12 months?: denied use How many times?:  ("Too Many") Other Self Harm Risks: denied Triggers for Past Attempts: Hallucinations, Family contact, Other personal contacts Intentional Self Injurious Behavior: None Risk to Others: Homicidal Ideation: No Thoughts of Harm to Others: No Current Homicidal Intent: No Current Homicidal Plan: No Access to Homicidal Means: No Identified Victim: Denied History of harm to others?: No Assessment of Violence: None Noted Violent Behavior Description: denied Does patient have access to weapons?: No Criminal Charges Pending?: No Does patient have a court date: No Prior Inpatient Therapy: Prior Inpatient Therapy: Yes Prior Therapy Dates: 07/16, 06/16, 04/16, 04/15, 08/14, 1/17 Prior Therapy Facilty/Provider(s): [facility], [place], [place] Reason for Treatment: Schizophrenia Prior Outpatient Therapy: Prior Outpatient Therapy: Yes Prior Therapy Dates: Current' Prior Therapy Facilty/Provider(s): [facility] Reason for Treatment: Schizophrenia  Does patient have an ACCT team?: No Does patient have Intensive In-House Services?  : No Does patient  have [facility] services? : No Does patient have P4CC services?: No  Past Medical  History:  Past Medical History  Diagnosis Date  . Hallucinations 09/30/2014    Sizoaffective  . Depression   . Anxiety   . Hypertension   . Tardive dyskinesia 10/2014    recent onset  . GERD (gastroesophageal reflux disease)   . Hyperlipidemia     Past Surgical History  Procedure Laterality Date  . Wisdom tooth extraction    . Esophagogastroduodenoscopy N/A 11/28/2014    Procedure: ESOPHAGOGASTRODUODENOSCOPY (EGD);  Surgeon: [name], MD;  Location: [facility] ENDOSCOPY;  Service: Endoscopy;  Laterality: N/A;  . Abdominal surgery      "years ago" to remove foreign objects  . Breast lumpectomy     Family History:  Family History  Problem Relation Age of Onset  . Depression Mother   . Hypertension Mother    Family Psychiatric  History: Does not know of any specific family history of mental health problems Social History:  History  Alcohol Use No     History  Drug Use No    Social History   Social History  . Marital Status: Single    Spouse Name: N/A  . Number of Children: N/A  . Years of Education: N/A   Social History Main Topics  . Smoking status: Current Every Day Smoker -- 0.50 packs/day for 3 years    Types: Cigarettes  . Smokeless tobacco: Never Used  . Alcohol Use: No  . Drug Use: No  . Sexual Activity: No   Other Topics Concern  . None   Social History Narrative   Additional Social History:    Allergies:   Allergies  Allergen Reactions  . Betadine [Povidone Iodine] Other (See Comments)    Reaction:  Unknown   . Shellfish-Derived Products Other (See Comments)    Reaction:  Unknown   . Iodine Rash    Labs:  Results for orders placed or performed during the hospital encounter of 06/22/15 (from the past 48 hour(s))  Urine Drug Screen, Qualitative ([place] only)     Status: Abnormal   Collection Time: 06/22/15  8:35 PM  Result Value Ref Range   Tricyclic, Ur Screen NONE DETECTED NONE DETECTED   Amphetamines, Ur Screen NONE DETECTED NONE  DETECTED   MDMA (Ecstasy)Ur Screen NONE DETECTED NONE DETECTED   Cocaine Metabolite,Ur [place] NONE DETECTED NONE DETECTED   Opiate, Ur Screen NONE DETECTED NONE DETECTED   Phencyclidine (PCP) Ur S NONE DETECTED NONE DETECTED   Cannabinoid 50 Ng, Ur [place] NONE DETECTED NONE DETECTED   Barbiturates, Ur Screen NONE DETECTED NONE DETECTED   Benzodiazepine, Ur Scrn POSITIVE (A) NONE DETECTED   Methadone Scn, Ur NONE DETECTED NONE DETECTED    Comment: (NOTE) 536  Tricyclics, urine               Cutoff 1000 ng/mL 200  Amphetamines, urine             Cutoff 1000 ng/mL 300  MDMA (Ecstasy), urine           Cutoff 500 ng/mL 400  Cocaine Metabolite, urine       Cutoff 300 ng/mL 500  Opiate, urine                   Cutoff 300 ng/mL 600  Phencyclidine (PCP), urine      Cutoff 25 ng/mL 700  Cannabinoid, urine  Cutoff 50 ng/mL 800  Barbiturates, urine             Cutoff 200 ng/mL 900  Benzodiazepine, urine           Cutoff 200 ng/mL 1000 Methadone, urine                Cutoff 300 ng/mL 1100 1200 The urine drug screen provides only a preliminary, unconfirmed 1300 analytical test result and should not be used for non-medical 1400 purposes. Clinical consideration and professional judgment should 1500 be applied to any positive drug screen result due to possible 1600 interfering substances. A more specific alternate chemical method 1700 must be used in order to obtain a confirmed analytical result.  1800 Gas chromato graphy / mass spectrometry (GC/MS) is the preferred 1900 confirmatory method.   Pregnancy, urine POC     Status: None   Collection Time: 06/22/15  8:39 PM  Result Value Ref Range   Preg Test, Ur NEGATIVE NEGATIVE    Comment:        THE SENSITIVITY OF THIS METHODOLOGY IS >24 mIU/mL   Comprehensive metabolic panel     Status: Abnormal   Collection Time: 06/22/15  8:55 PM  Result Value Ref Range   Sodium 140 135 - 145 mmol/L   Potassium 3.6 3.5 - 5.1 mmol/L   Chloride 108 101 -  111 mmol/L   CO2 26 22 - 32 mmol/L   Glucose, Bld 89 65 - 99 mg/dL   BUN 14 6 - 20 mg/dL   Creatinine, Ser 0.82 0.44 - 1.00 mg/dL   Calcium 9.2 8.9 - 10.3 mg/dL   Total Protein 7.0 6.5 - 8.1 g/dL   Albumin 4.1 3.5 - 5.0 g/dL   AST 16 15 - 41 U/L   ALT 10 (L) 14 - 54 U/L   Alkaline Phosphatase 81 38 - 126 U/L   Total Bilirubin 0.2 (L) 0.3 - 1.2 mg/dL   GFR calc non Af Amer >60 >60 mL/min   GFR calc Af Amer >60 >60 mL/min    Comment: (NOTE) The eGFR has been calculated using the CKD EPI equation. This calculation has not been validated in all clinical situations. eGFR's persistently <60 mL/min signify possible Chronic Kidney Disease.    Anion gap 6 5 - 15  Ethanol (ETOH)     Status: None   Collection Time: 06/22/15  8:55 PM  Result Value Ref Range   Alcohol, Ethyl (B) <5 <5 mg/dL    Comment:        LOWEST DETECTABLE LIMIT FOR SERUM ALCOHOL IS 5 mg/dL FOR MEDICAL PURPOSES ONLY   Salicylate level     Status: None   Collection Time: 06/22/15  8:55 PM  Result Value Ref Range   Salicylate Lvl <0.9 2.8 - 30.0 mg/dL  Acetaminophen level     Status: Abnormal   Collection Time: 06/22/15  8:55 PM  Result Value Ref Range   Acetaminophen (Tylenol), Serum <10 (L) 10 - 30 ug/mL    Comment:        THERAPEUTIC CONCENTRATIONS VARY SIGNIFICANTLY. A RANGE OF 10-30 ug/mL MAY BE AN EFFECTIVE CONCENTRATION FOR MANY PATIENTS. HOWEVER, SOME ARE BEST TREATED AT CONCENTRATIONS OUTSIDE THIS RANGE. ACETAMINOPHEN CONCENTRATIONS >150 ug/mL AT 4 HOURS AFTER INGESTION AND >50 ug/mL AT 12 HOURS AFTER INGESTION ARE OFTEN ASSOCIATED WITH TOXIC REACTIONS.   CBC     Status: None   Collection Time: 06/22/15  8:55 PM  Result Value Ref Range   WBC 9.0  3.6 - 11.0 K/uL   RBC 4.07 3.80 - 5.20 MIL/uL   Hemoglobin 12.2 12.0 - 16.0 g/dL   HCT 36.8 35.0 - 47.0 %   MCV 90.5 80.0 - 100.0 fL   MCH 30.1 26.0 - 34.0 pg   MCHC 33.3 32.0 - 36.0 g/dL   RDW 13.7 11.5 - 14.5 %   Platelets 202 150 - 440 K/uL     Current Facility-Administered Medications  Medication Dose Route Frequency Provider Last Rate Last Dose  . acetaminophen (TYLENOL) tablet 650 mg  650 mg Oral Q6H PRN Harvest Dark, MD      . benztropine (COGENTIN) tablet 1 mg  1 mg Oral QHS Harvest Dark, MD   1 mg at 06/23/15 2116  . docusate sodium (COLACE) capsule 100 mg  100 mg Oral BID Harvest Dark, MD   100 mg at 06/24/15 0950  . fluPHENAZine (PROLIXIN) tablet 5 mg  5 mg Oral 3 times per day Harvest Dark, MD   5 mg at 06/24/15 1346  . levothyroxine (SYNTHROID, LEVOTHROID) tablet 75 mcg  75 mcg Oral QAC breakfast Harvest Dark, MD   75 mcg at 06/24/15 0751  . lurasidone (LATUDA) tablet 120 mg  120 mg Oral Q breakfast Harvest Dark, MD   120 mg at 06/24/15 0751  . OXcarbazepine (TRILEPTAL) tablet 150 mg  150 mg Oral BID Harvest Dark, MD   150 mg at 06/24/15 0950  . temazepam (RESTORIL) capsule 15 mg  15 mg Oral QHS PRN [name] [name], MD      . venlafaxine XR (EFFEXOR-XR) 24 hr capsule 150 mg  150 mg Oral q morning - 10a Harvest Dark, MD   150 mg at 06/24/15 0263   Current Outpatient Prescriptions  Medication Sig Dispense Refill  . acetaminophen (TYLENOL) 325 MG tablet Take 2 tablets (650 mg total) by mouth every 6 (six) hours as needed for mild pain. 30 tablet 0  . alum & mag hydroxide-simeth (MAALOX/MYLANTA) 200-200-20 MG/5ML suspension Take 30 mLs by mouth every 4 (four) hours as needed for indigestion. 355 mL 0  . amLODipine (NORVASC) 2.5 MG tablet Take 1 tablet (2.5 mg total) by mouth daily. 30 tablet 0  . benztropine (COGENTIN) 1 MG tablet Take 1 tablet (1 mg total) by mouth at bedtime. 30 tablet 0  . docusate sodium (COLACE) 100 MG capsule Take 2 capsules (200 mg total) by mouth 2 (two) times daily. 120 capsule 0  . fluPHENAZine (PROLIXIN) 5 MG tablet Take 1 tablet (5 mg total) by mouth 3 (three) times daily. 90 tablet 0  . levothyroxine (SYNTHROID, LEVOTHROID) 75 MCG tablet Take 1 tablet  (75 mcg total) by mouth daily before breakfast. 30 tablet 0  . Lurasidone HCl 120 MG TABS Take 1 tablet (120 mg total) by mouth daily with supper. 30 tablet 0  . OXcarbazepine (TRILEPTAL) 150 MG tablet Take 1 tablet (150 mg total) by mouth 2 (two) times daily. 60 tablet 0  . traZODone (DESYREL) 50 MG tablet Take 1 tablet (50 mg total) by mouth at bedtime. 30 tablet 0  . venlafaxine XR (EFFEXOR-XR) 150 MG 24 hr capsule Take 1 capsule (150 mg total) by mouth daily with breakfast. 30 capsule 0  . ibuprofen (ADVIL,MOTRIN) 800 MG tablet Take 800 mg by mouth every 8 (eight) hours as needed.    . temazepam (RESTORIL) 15 MG capsule Take 1 capsule (15 mg total) by mouth at bedtime as needed for sleep. (Patient not taking: Reported on 06/22/2015) 30 capsule 0  Musculoskeletal: Strength & Muscle Tone: within normal limits Gait & Station: normal Patient leans: N/A  Psychiatric Specialty Exam: Review of Systems  Constitutional: Negative.   HENT: Negative.   Eyes: Negative.   Respiratory: Negative.   Cardiovascular: Negative.   Gastrointestinal: Negative.   Musculoskeletal: Negative.   Skin: Negative.   Neurological: Negative.   Psychiatric/Behavioral: Positive for depression and hallucinations. Negative for suicidal ideas, memory loss and substance abuse. The patient is nervous/anxious. The patient does not have insomnia.     Blood pressure 134/70, pulse 78, temperature 98.3 F (36.8 C), temperature source Oral, resp. rate 16, height 5' 6" (1.676 m), weight 87.091 kg (192 lb), last menstrual period 05/04/2015, SpO2 99 %.Body mass index is 31 kg/(m^2).  General Appearance: Disheveled  Eye Contact::  Good  Speech:  Normal Rate  Volume:  Decreased  Mood:  Dysphoric  Affect:  Constricted  Thought Process:  Intact  Orientation:  Full (Time, Place, and Person)  Thought Content:  Hallucinations: Auditory  Suicidal Thoughts:  No  Homicidal Thoughts:  No  Memory:  Immediate;   Fair Recent;    Fair Remote;   Fair  Judgement:  Fair  Insight:  Lacking  Psychomotor Activity:  Decreased  Concentration:  Fair  Recall:  [employer] of Knowledge:Fair  Language: Fair  Akathisia:  No  Handed:  Right  AIMS (if indicated):     Assets:  Desire for Improvement Financial Resources/Insurance Housing Resilience  ADL's:  Intact  Cognition: Impaired,  Mild  Sleep:      Treatment Plan Summary: Plan Patient currently is having hallucinations but there is no indication of suicidality or acute dangerousness. Does not require hospital level treatment. We have spoken with her group home and everyone is in agreement that the patient consciously or not tends to use the emergency room as a stress reliever and tries to manipulate herself into the hospital although right now she is not being difficult about it. She agrees she is not suicidal and is in agreement with going back to her group home. Case reviewed with emergency room doctor and social work. She will be discharged back to her home and follow-up with [facility]. Continue current medicine.  Disposition: Patient does not meet criteria for psychiatric inpatient admission. Supportive therapy provided about ongoing stressors.  Alethia Berthold, MD 06/24/2015 4:42 PM

## 2015-06-24 NOTE — ED Notes (Signed)
Patient asleep in room. No noted distress or abnormal behavior. Will continue 15 minute checks and observation by security cameras for safety. 

## 2015-06-24 NOTE — ED Provider Notes (Signed)
-----------------------------------------   5:04 PM on 06/24/2015 -----------------------------------------   Blood pressure 134/70, pulse 78, temperature 98.3 F (36.8 C), temperature source Oral, resp. rate 16, height 5\' 6"  (1.676 m), weight 192 lb (87.091 kg), last menstrual period 05/04/2015, SpO2 99 %.  The patient had no acute events since last update.  Calm and cooperative at this time.  Disposition is pending per Psychiatry/Behavioral Medicine team recommendations.   Patient was seen by psychiatry and was cleared and felt that the could discharge the patient home. Patient be discharged from our standpoint and be referred to [facility]. All medications etc. has been arranged through the psychiatry department.  Daymon Larsen, MD 06/24/15 [phone]

## 2015-06-30 ENCOUNTER — Encounter: Payer: Self-pay | Admitting: Emergency Medicine

## 2015-06-30 ENCOUNTER — Emergency Department: Payer: MEDICAID

## 2015-06-30 ENCOUNTER — Emergency Department
Admission: EM | Admit: 2015-06-30 | Discharge: 2015-07-03 | Disposition: A | Discharge status: Other Acute Inpatient Hospital | Payer: MEDICAID | Attending: Emergency Medicine | Admitting: Emergency Medicine

## 2015-06-30 DIAGNOSIS — F17210 Nicotine dependence, cigarettes, uncomplicated: Secondary | ICD-10-CM | POA: Diagnosis not present

## 2015-06-30 DIAGNOSIS — F250 Schizoaffective disorder, bipolar type: Secondary | ICD-10-CM

## 2015-06-30 DIAGNOSIS — Z008 Encounter for other general examination: Secondary | ICD-10-CM | POA: Diagnosis present

## 2015-06-30 DIAGNOSIS — Z79899 Other long term (current) drug therapy: Secondary | ICD-10-CM | POA: Insufficient documentation

## 2015-06-30 DIAGNOSIS — E039 Hypothyroidism, unspecified: Secondary | ICD-10-CM | POA: Diagnosis present

## 2015-06-30 DIAGNOSIS — Y9289 Other specified places as the place of occurrence of the external cause: Secondary | ICD-10-CM | POA: Insufficient documentation

## 2015-06-30 DIAGNOSIS — Y9389 Activity, other specified: Secondary | ICD-10-CM | POA: Insufficient documentation

## 2015-06-30 DIAGNOSIS — R45851 Suicidal ideations: Secondary | ICD-10-CM | POA: Diagnosis not present

## 2015-06-30 DIAGNOSIS — X58XXXA Exposure to other specified factors, initial encounter: Secondary | ICD-10-CM | POA: Insufficient documentation

## 2015-06-30 DIAGNOSIS — Z3202 Encounter for pregnancy test, result negative: Secondary | ICD-10-CM | POA: Diagnosis not present

## 2015-06-30 DIAGNOSIS — I10 Essential (primary) hypertension: Secondary | ICD-10-CM | POA: Insufficient documentation

## 2015-06-30 DIAGNOSIS — T189XXA Foreign body of alimentary tract, part unspecified, initial encounter: Secondary | ICD-10-CM | POA: Insufficient documentation

## 2015-06-30 DIAGNOSIS — Y998 Other external cause status: Secondary | ICD-10-CM | POA: Insufficient documentation

## 2015-06-30 DIAGNOSIS — T189XXD Foreign body of alimentary tract, part unspecified, subsequent encounter: Secondary | ICD-10-CM

## 2015-06-30 LAB — CBC
HEMATOCRIT: 35.6 % (ref 35.0–47.0)
HEMOGLOBIN: 12 g/dL (ref 12.0–16.0)
MCH: 30.3 pg (ref 26.0–34.0)
MCHC: 33.7 g/dL (ref 32.0–36.0)
MCV: 89.8 fL (ref 80.0–100.0)
PLATELETS: 201 10*3/uL (ref 150–440)
RBC: 3.97 MIL/uL (ref 3.80–5.20)
RDW: 13.6 % (ref 11.5–14.5)
WBC: 7.2 10*3/uL (ref 3.6–11.0)

## 2015-06-30 LAB — COMPREHENSIVE METABOLIC PANEL
ALBUMIN: 4.1 g/dL (ref 3.5–5.0)
ALK PHOS: 79 U/L (ref 38–126)
ALT: 8 U/L — ABNORMAL LOW (ref 14–54)
AST: 14 U/L — AB (ref 15–41)
Anion gap: 4 — ABNORMAL LOW (ref 5–15)
BILIRUBIN TOTAL: 0.5 mg/dL (ref 0.3–1.2)
BUN: 10 mg/dL (ref 6–20)
CO2: 25 mmol/L (ref 22–32)
Calcium: 9.2 mg/dL (ref 8.9–10.3)
Chloride: 109 mmol/L (ref 101–111)
Creatinine, Ser: 0.61 mg/dL (ref 0.44–1.00)
GFR calc Af Amer: >60 mL/min (ref >60)
GFR calc non Af Amer: >60 mL/min (ref >60)
GLUCOSE: 83 mg/dL (ref 65–99)
POTASSIUM: 3.9 mmol/L (ref 3.5–5.1)
Sodium: 138 mmol/L (ref 135–145)
TOTAL PROTEIN: 7 g/dL (ref 6.5–8.1)

## 2015-06-30 LAB — URINE DRUG SCREEN, QUALITATIVE (ARMC ONLY)
AMPHETAMINES, UR SCREEN: NOT DETECTED
BENZODIAZEPINE, UR SCRN: NOT DETECTED
Barbiturates, Ur Screen: NOT DETECTED
Cannabinoid 50 Ng, Ur ~~LOC~~: NOT DETECTED
Cocaine Metabolite,Ur ~~LOC~~: NOT DETECTED
MDMA (ECSTASY) UR SCREEN: NOT DETECTED
METHADONE SCREEN, URINE: NOT DETECTED
Opiate, Ur Screen: NOT DETECTED
Phencyclidine (PCP) Ur S: NOT DETECTED
TRICYCLIC, UR SCREEN: NOT DETECTED

## 2015-06-30 LAB — PREGNANCY, URINE: Preg Test, Ur: NEGATIVE

## 2015-06-30 LAB — ACETAMINOPHEN LEVEL: Acetaminophen (Tylenol), Serum: <10 ug/mL — ABNORMAL LOW (ref 10–30)

## 2015-06-30 LAB — SALICYLATE LEVEL: Salicylate Lvl: <4 mg/dL (ref 2.8–30.0)

## 2015-06-30 LAB — ETHANOL: Alcohol, Ethyl (B): <5 mg/dL (ref <5)

## 2015-06-30 IMAGING — CR DG ABDOMEN 1V
1 series · 1 of 1 positions shown · non-contrast
Comparison: [DATE]

CLINICAL DATA: Patient swallowed earring during earlier today

EXAM:
ABDOMEN - 1 VIEW

[ap]
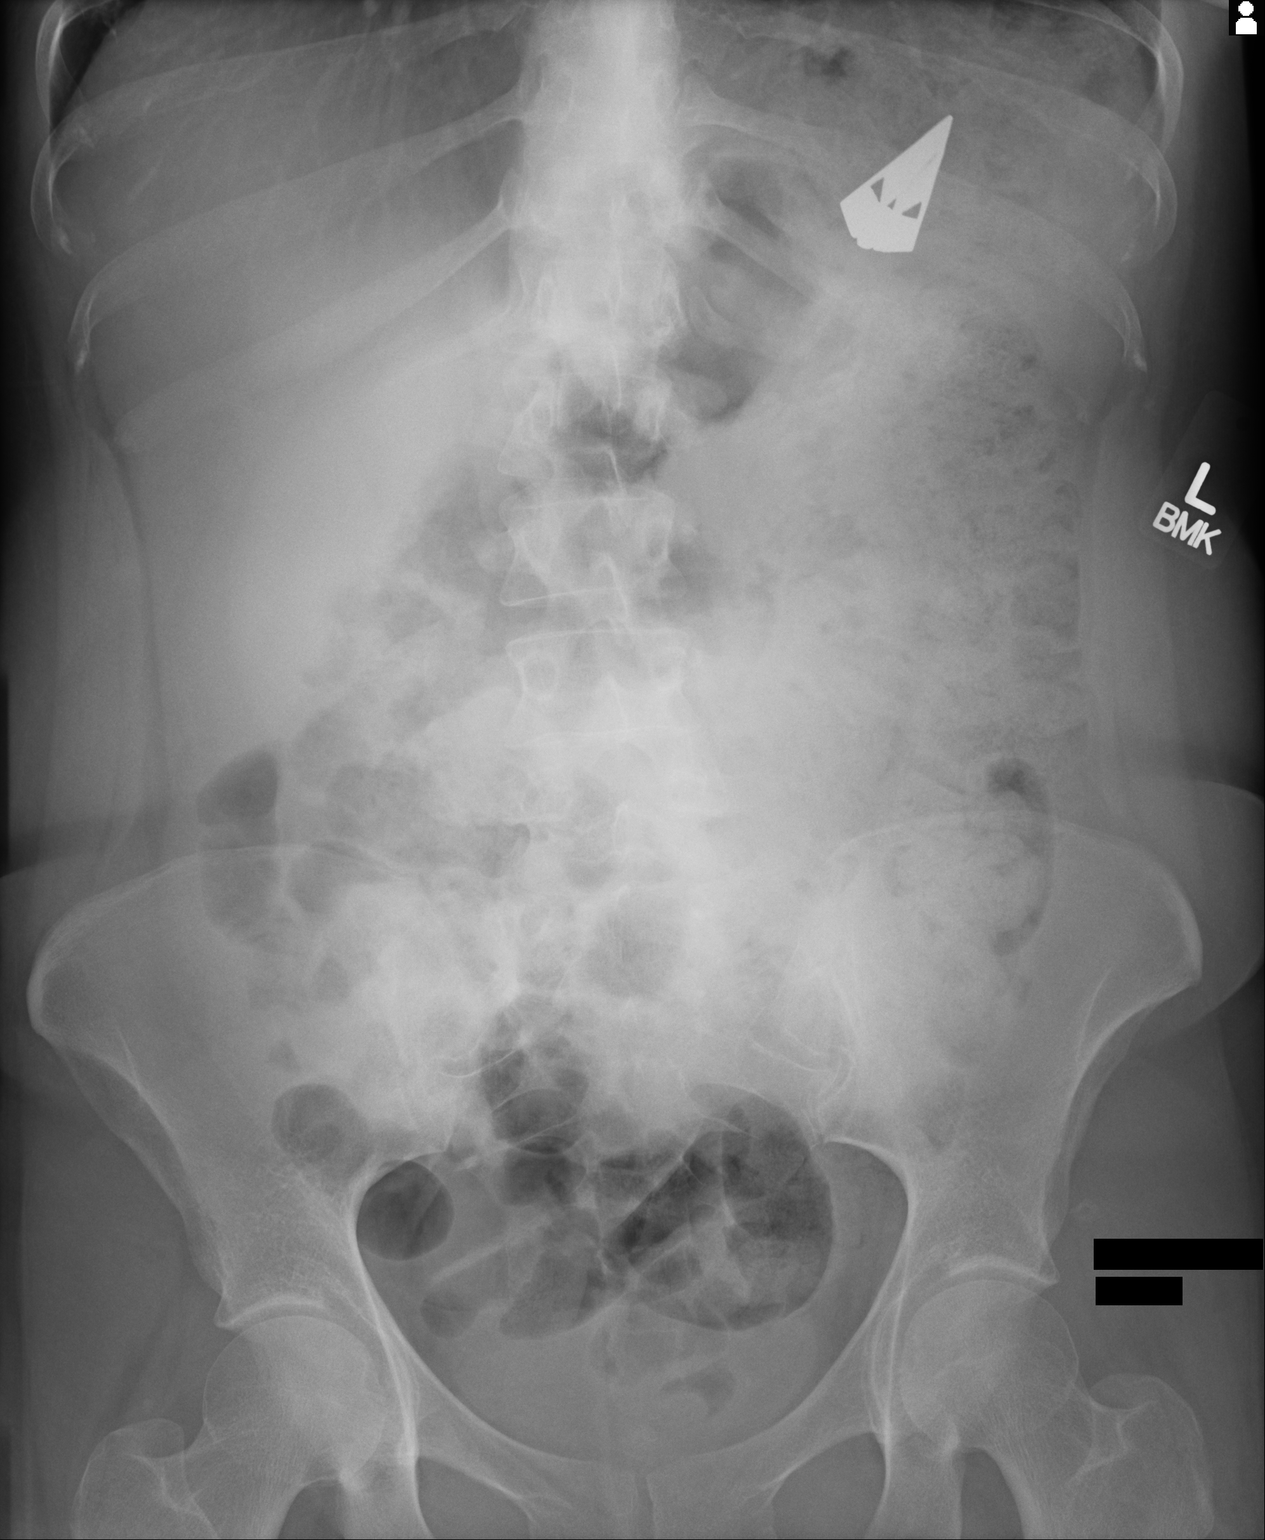

[1 of 1 positions shown; findings below may reference images not displayed]

FINDINGS: Metallic foreign body is noted in the region of the stomach. Note
that the apex of this foreign body appears to have a sharp edge.
There is diffuse stool throughout the colon. There is no bowel
dilatation or air-fluid level suggesting obstruction. No free air is
seen on this supine examination.
IMPRESSION: Radiopaque foreign body in region of stomach. Diffuse stool in
colon. Bowel gas pattern unremarkable. No free air evident.

## 2015-06-30 NOTE — ED Notes (Signed)
BEHAVIORAL HEALTH ROUNDING Patient sleeping: no Patient alert and oriented: yes Behavior appropriate: yes Describe behavior: No inappropriate or unacceptable behaviors noted at this time.  Nutrition and fluids offered: NPO Toileting and hygiene offered: yes Sitter present: Behavioral tech rounding every 15 minutes on patient to ensure safety.  Law enforcement present: Programmer, applications agency: [employer] ([place])

## 2015-06-30 NOTE — ED Notes (Signed)
X-ray notified of negative urine-preg

## 2015-06-30 NOTE — ED Provider Notes (Signed)
[facility] Emergency Department Provider Note  ____________________________________________  Time seen: 5:00 PM  I have reviewed the triage vital signs and the nursing notes.   HISTORY  Chief Complaint Psychiatric Evaluation    HPI Melanie Bell is a 25 y.o. female with a history of schizoaffective disorder who complains of worsening command auditory hallucinations that tell her to harm herself. She states she's been compliant with her medications but she can't escape the voices. She is hearing them currently. They tell her specifically to swallow sharp objects. Today she swallowed an earring. She states she was trying to harm herself and does still feel suicidal and is ready that she'll do other things to harm herself if able. Currently in the emergency department she states that she feels safe in this environment.  No HI or visual hallucinations.   Past Medical History  Diagnosis Date  . Hallucinations 09/30/2014    Sizoaffective  . Depression   . Anxiety   . Hypertension   . Tardive dyskinesia 10/2014    recent onset  . GERD (gastroesophageal reflux disease)   . Hyperlipidemia      Patient Active Problem List   Diagnosis Date Noted  . Suicidal ideation 06/15/2015  . Schizoaffective disorder, bipolar type ([place]) 11/06/2014  . Tardive dyskinesia 10/06/2014  . Tobacco use disorder 09/30/2014  . Hypothyroidism 09/29/2014  . Hypertension 09/29/2014  . Constipation 09/29/2014     Past Surgical History  Procedure Laterality Date  . Wisdom tooth extraction    . Esophagogastroduodenoscopy N/A 11/28/2014    Procedure: ESOPHAGOGASTRODUODENOSCOPY (EGD);  Surgeon: [name], MD;  Location: [facility] ENDOSCOPY;  Service: Endoscopy;  Laterality: N/A;  . Abdominal surgery      "years ago" to remove foreign objects  . Breast lumpectomy       Current Outpatient Rx  Name  Route  Sig  Dispense  Refill  . acetaminophen (TYLENOL) 325 MG tablet    Oral   Take 2 tablets (650 mg total) by mouth every 6 (six) hours as needed for mild pain.   30 tablet   0   . alum & mag hydroxide-simeth (MAALOX/MYLANTA) 200-200-20 MG/5ML suspension   Oral   Take 30 mLs by mouth every 4 (four) hours as needed for indigestion.   355 mL   0   . amLODipine (NORVASC) 2.5 MG tablet   Oral   Take 1 tablet (2.5 mg total) by mouth daily.   30 tablet   0   . benztropine (COGENTIN) 1 MG tablet   Oral   Take 1 tablet (1 mg total) by mouth at bedtime.   30 tablet   0   . docusate sodium (COLACE) 100 MG capsule   Oral   Take 2 capsules (200 mg total) by mouth 2 (two) times daily.   120 capsule   0   . fluPHENAZine (PROLIXIN) 5 MG tablet   Oral   Take 1 tablet (5 mg total) by mouth 3 (three) times daily.   90 tablet   0   . ibuprofen (ADVIL,MOTRIN) 800 MG tablet   Oral   Take 800 mg by mouth every 8 (eight) hours as needed.         Marland Kitchen levothyroxine (SYNTHROID, LEVOTHROID) 75 MCG tablet   Oral   Take 1 tablet (75 mcg total) by mouth daily before breakfast.   30 tablet   0   . Lurasidone HCl 120 MG TABS   Oral   Take 1 tablet (  120 mg total) by mouth daily with supper.   30 tablet   0   . OXcarbazepine (TRILEPTAL) 150 MG tablet   Oral   Take 1 tablet (150 mg total) by mouth 2 (two) times daily.   60 tablet   0   . temazepam (RESTORIL) 15 MG capsule   Oral   Take 1 capsule (15 mg total) by mouth at bedtime as needed for sleep. Patient not taking: Reported on 06/22/2015   30 capsule   0   . traZODone (DESYREL) 50 MG tablet   Oral   Take 1 tablet (50 mg total) by mouth at bedtime.   30 tablet   0   . venlafaxine XR (EFFEXOR-XR) 150 MG 24 hr capsule   Oral   Take 1 capsule (150 mg total) by mouth daily with breakfast.   30 capsule   0      Allergies Betadine; Shellfish-derived products; and Iodine   Family History  Problem Relation Age of Onset  . Depression Mother   . Hypertension Mother     Social  History Social History  Substance Use Topics  . Smoking status: Current Every Day Smoker -- 0.50 packs/day for 3 years    Types: Cigarettes  . Smokeless tobacco: Never Used  . Alcohol Use: No    Review of Systems  Constitutional:   No fever or chills. No weight changes Eyes:   No blurry vision or double vision.  ENT:   No sore throat. Cardiovascular:   No chest pain. Respiratory:   No dyspnea or cough. Gastrointestinal:   Negative for abdominal pain, vomiting and diarrhea.  No BRBPR or melena. Genitourinary:   Negative for dysuria, urinary retention, bloody urine, or difficulty urinating. Musculoskeletal:   Negative for back pain. No joint swelling or pain. Skin:   Negative for rash. Neurological:   Negative for headaches, focal weakness or numbness. Psychiatric:  Positive depression..   Endocrine:  No hot/cold intolerance, changes in energy, or sleep difficulty.  10-point ROS otherwise negative.  ____________________________________________   PHYSICAL EXAM:  VITAL SIGNS: ED Triage Vitals  Enc Vitals Group     BP 06/30/15 1650 135/87 mmHg     Pulse Rate 06/30/15 1650 73     Resp 06/30/15 1650 16     Temp 06/30/15 1650 98.2 F (36.8 C)     Temp Source 06/30/15 1650 Oral     SpO2 06/30/15 1650 100 %     Weight 06/30/15 1650 190 lb (86.183 kg)     Height 06/30/15 1650 5\' 6"  (1.676 m)     Head Cir --      Peak Flow --      Pain Score --      Pain Loc --      Pain Edu? --      Excl. in [place]? --     Vital signs reviewed, nursing assessments reviewed.   Constitutional:   Alert and oriented. Well appearing and in no distress. Eyes:   No scleral icterus. No conjunctival pallor. PERRL. EOMI ENT   Head:   Normocephalic and atraumatic.   Nose:   No congestion/rhinnorhea. No septal hematoma   Mouth/Throat:   MMM, no pharyngeal erythema. No peritonsillar mass. No uvula shift.   Neck:   No stridor. No SubQ emphysema. No  meningismus. Hematological/Lymphatic/Immunilogical:   No cervical lymphadenopathy. Cardiovascular:   RRR. Normal and symmetric distal pulses are present in all extremities. No murmurs, rubs, or gallops. Respiratory:   Normal  respiratory effort without tachypnea nor retractions. Breath sounds are clear and equal bilaterally. No wheezes/rales/rhonchi. Gastrointestinal:   Soft and nontender. No distention. There is no CVA tenderness.  No rebound, rigidity, or guarding. Genitourinary:   deferred Musculoskeletal:   Nontender with normal range of motion in all extremities. No joint effusions.  No lower extremity tenderness.  No edema. Neurologic:   Normal speech and language.  CN 2-10 normal. Motor grossly intact. No pronator drift.  Normal gait. No gross focal neurologic deficits are appreciated.  Skin:    Skin is warm, dry and intact. No rash noted.  No petechiae, purpura, or bullae. Psychiatric:   Depressed mood and affect. Speech and behavior are normal. Auditory hallucinations. Not apparently responding to any internal stimuli. ____________________________________________    LABS (pertinent positives/negatives) (all labs ordered are listed, but only abnormal results are displayed) Labs Reviewed  COMPREHENSIVE METABOLIC PANEL - Abnormal; Notable for the following:    AST 14 (*)    ALT 8 (*)    Anion gap 4 (*)    All other components within normal limits  ACETAMINOPHEN LEVEL - Abnormal; Notable for the following:    Acetaminophen (Tylenol), Serum <10 (*)    All other components within normal limits  ETHANOL  SALICYLATE LEVEL  CBC  URINE DRUG SCREEN, QUALITATIVE ([facility] ONLY)  PREGNANCY, URINE  POC URINE PREG, ED   ____________________________________________   EKG    ____________________________________________    RADIOLOGY  KUB shows small metallic object in the  stomach.  ____________________________________________   PROCEDURES   ____________________________________________   INITIAL IMPRESSION / ASSESSMENT AND PLAN / ED COURSE  Pertinent labs & imaging results that were available during my care of the patient were reviewed by me and considered in my medical decision making (see chart for details).  Patient presents with SI, auditory hallucinations, and swallowing an earring. X-ray demonstrates that the object is in the stomach. We'll have to monitor for passage. This is worrisome for decompensation of her psychosis. Place under involuntary commitment pending psychiatry evaluation. Otherwise she is medically stable at this time without any evidence of bowel injury.     ____________________________________________   FINAL CLINICAL IMPRESSION(S) / ED DIAGNOSES  Final diagnoses:  Suicidal ideation  Schizoaffective disorder, bipolar type ([place])  Swallowed foreign body, initial encounter      Carrie Mew, MD 06/30/15 2236

## 2015-06-30 NOTE — ED Notes (Signed)
BEHAVIORAL HEALTH ROUNDING Patient sleeping: YES Patient alert and oriented: Sleeping Behavior appropriate: Sleeping Describe behavior: No inappropriate or unacceptable behaviors noted at this time.  Nutrition and fluids offered: Sleeping Toileting and hygiene offered: Sleeping Sitter present: Behavioral tech rounding every 15 minutes on patient to ensure safety.  Law enforcement present: YES Law enforcement agency: [employer] ([employer] 

## 2015-06-30 NOTE — ED Notes (Signed)
Pt c/o hearing voices that started today.  Employee from group home brought pt up here.  Has had SI but denies HI.  Denies having plan for suicide.

## 2015-06-30 NOTE — ED Notes (Signed)
X-ray at bedside

## 2015-06-30 NOTE — ED Notes (Signed)
Pt also c/o hearing voices.

## 2015-06-30 NOTE — ED Notes (Signed)
Pt okay to move to [facility] by MD Joni Fears
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
## 2015-07-01 ENCOUNTER — Emergency Department: Payer: MEDICAID

## 2015-07-01 DIAGNOSIS — F250 Schizoaffective disorder, bipolar type: Secondary | ICD-10-CM | POA: Diagnosis not present

## 2015-07-01 IMAGING — CR DG ABDOMEN 1V
1 series · 1 of 1 positions shown · non-contrast
Comparison: [DATE]

CLINICAL DATA: Foreign body ingestion.

EXAM:
ABDOMEN - 1 VIEW

[abdomen kub]
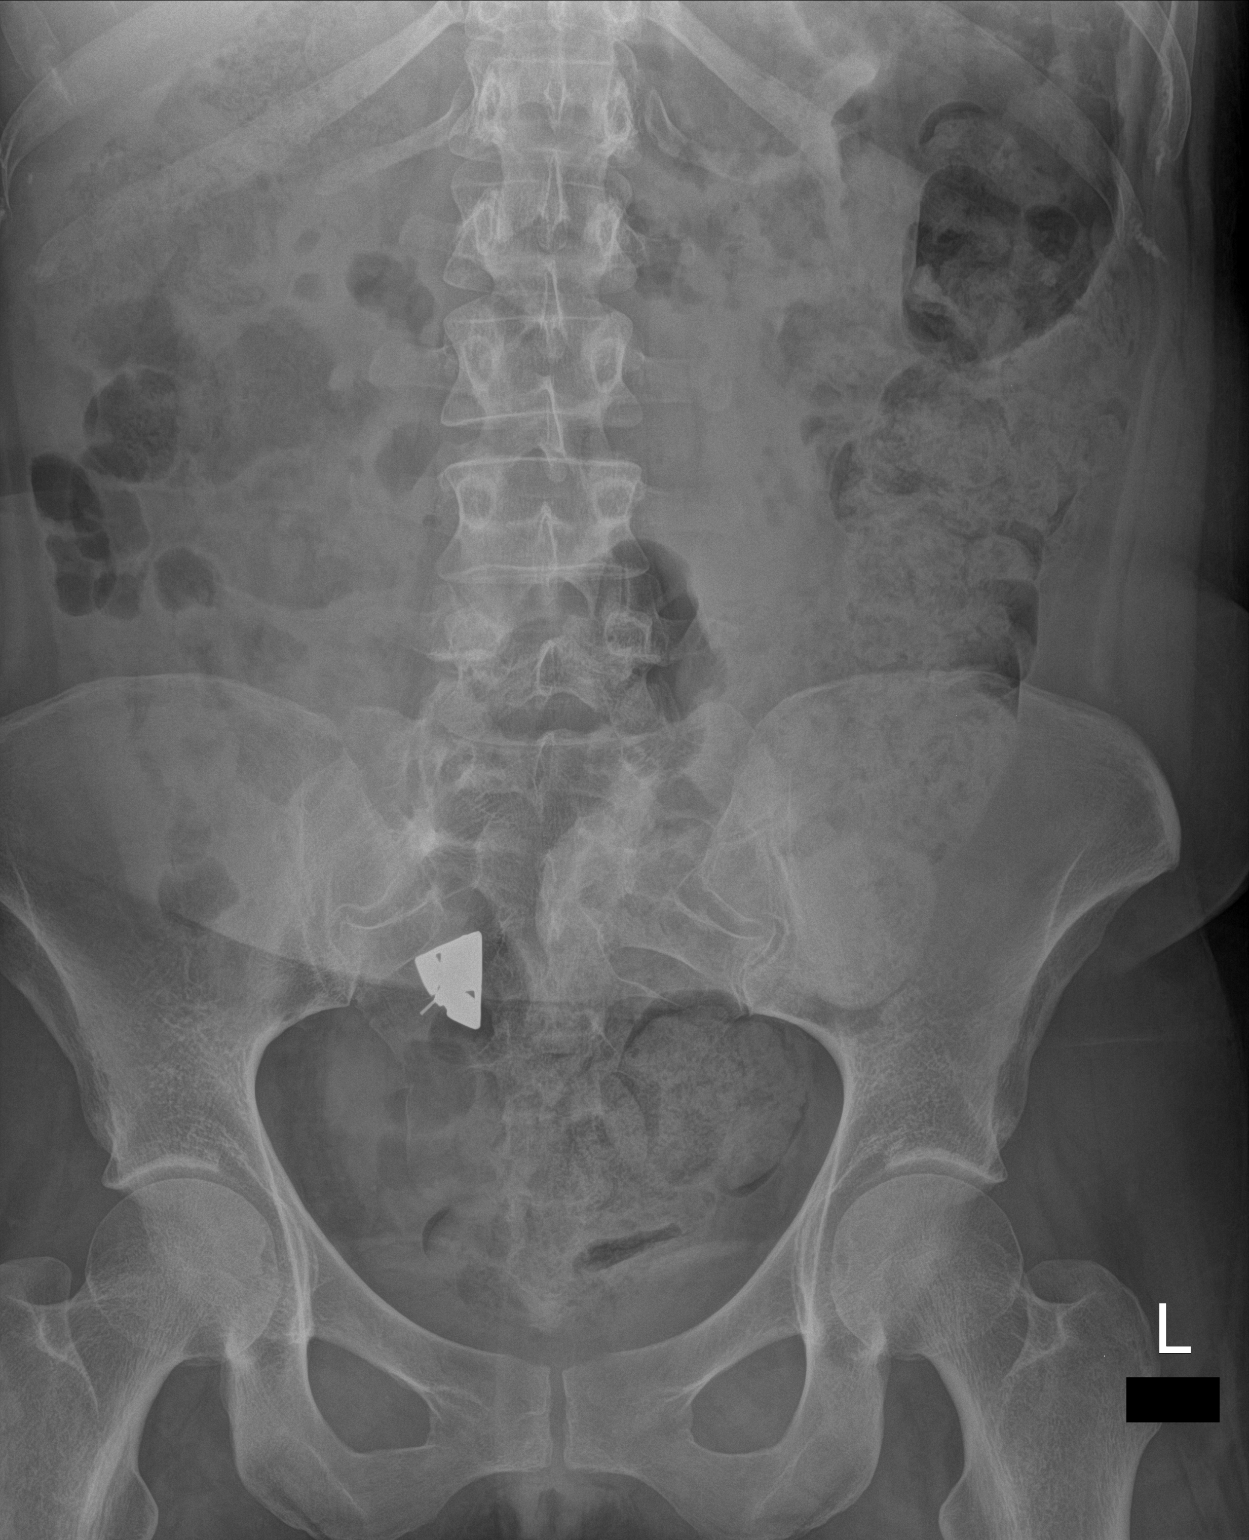

[1 of 1 positions shown; findings below may reference images not displayed]

FINDINGS: The ingested earring is in the right lower quadrant. This is
probably in the distal small bowel.

Bowel gas pattern is normal.  No osseous abnormality.
IMPRESSION: Foreign body has moved into the right lower quadrant, probably in
the distal small bowel.

## 2015-07-01 MED ORDER — VENLAFAXINE HCL ER 75 MG PO CP24
150.0000 mg | ORAL_CAPSULE | Freq: Every day | ORAL | Status: DC
  Administered 2015-07-02 – 2015-07-03 (×2): 150 mg via ORAL
  Filled 2015-07-01 (×2): qty 2

## 2015-07-01 MED ORDER — DOCUSATE SODIUM 100 MG PO CAPS
200.0000 mg | ORAL_CAPSULE | Freq: Two times a day (BID) | ORAL | Status: DC
  Administered 2015-07-01 – 2015-07-03 (×4): 200 mg via ORAL
  Filled 2015-07-01 (×4): qty 2

## 2015-07-01 MED ORDER — LURASIDONE HCL 80 MG PO TABS
120.0000 mg | ORAL_TABLET | Freq: Every day | ORAL | Status: DC
  Administered 2015-07-02 – 2015-07-03 (×2): 120 mg via ORAL
  Filled 2015-07-01 (×2): qty 2

## 2015-07-01 MED ORDER — BENZTROPINE MESYLATE 1 MG PO TABS
1.0000 mg | ORAL_TABLET | Freq: Every day | ORAL | Status: DC
  Administered 2015-07-01 – 2015-07-02 (×2): 1 mg via ORAL
  Filled 2015-07-01 (×2): qty 1

## 2015-07-01 MED ORDER — TRAZODONE HCL 100 MG PO TABS
100.0000 mg | ORAL_TABLET | Freq: Every day | ORAL | Status: DC
  Administered 2015-07-01 – 2015-07-02 (×2): 100 mg via ORAL
  Filled 2015-07-01 (×2): qty 1

## 2015-07-01 MED ORDER — OXCARBAZEPINE 300 MG PO TABS
150.0000 mg | ORAL_TABLET | Freq: Two times a day (BID) | ORAL | Status: DC
  Administered 2015-07-01 – 2015-07-03 (×5): 150 mg via ORAL
  Filled 2015-07-01: qty 1
  Filled 2015-07-01: qty 2
  Filled 2015-07-01 (×3): qty 1

## 2015-07-01 MED ORDER — AMLODIPINE BESYLATE 5 MG PO TABS
2.5000 mg | ORAL_TABLET | Freq: Every day | ORAL | Status: DC
  Administered 2015-07-03: 2.5 mg via ORAL
  Filled 2015-07-01: qty 1

## 2015-07-01 MED ORDER — FLUPHENAZINE HCL 5 MG PO TABS
5.0000 mg | ORAL_TABLET | Freq: Three times a day (TID) | ORAL | Status: DC
  Administered 2015-07-01 – 2015-07-03 (×7): 5 mg via ORAL
  Filled 2015-07-01 (×8): qty 1

## 2015-07-01 MED ORDER — LEVOTHYROXINE SODIUM 75 MCG PO TABS
75.0000 ug | ORAL_TABLET | Freq: Every day | ORAL | Status: DC
  Administered 2015-07-02 – 2015-07-03 (×2): 75 ug via ORAL
  Filled 2015-07-01 (×4): qty 1

## 2015-07-01 NOTE — ED Notes (Signed)
Taken for abd xray, she was very cooperative . Officer in attendance

## 2015-07-01 NOTE — ED Notes (Signed)
Pt. Noted in room resting quietly;. No complaints or concerns voiced. No distress or abnormal behavior noted. Will continue to monitor with security cameras. Q 15 minute rounds continue. 

## 2015-07-01 NOTE — ED Notes (Signed)
Report received from Deborah Chalk., RN.; Pt. Alert and oriented in no distress; verbalizes having SI; denies having HI, AVH and pain.  Pt. Instructed to come to me with problems or concerns.Will continue to monitor for safety via security cameras and Q 15 minute checks.

## 2015-07-01 NOTE — ED Notes (Signed)

## 2015-07-01 NOTE — ED Notes (Signed)
Pt. Noted in room. No complaints or concerns voiced. No distress or abnormal behavior noted. Will continue to monitor with security cameras. Q 15 minute rounds continue., pt notified that she is being referred for admission . She was informed to notify us if she has a bm if she sees her earing that she swallowed

## 2015-07-01 NOTE — ED Notes (Signed)
Dr clapac here seeing pt

## 2015-07-01 NOTE — ED Notes (Signed)
Kitchen called to being a tray

## 2015-07-01 NOTE — ED Notes (Addendum)
Pt given dinner, she no c/o pain or nasuea

## 2015-07-01 NOTE — ED Notes (Signed)
Given lunch

## 2015-07-01 NOTE — ED Notes (Signed)
Pt. Noted in room. No complaints or concerns voiced. No distress or abnormal behavior noted. Will continue to monitor with security cameras. Q 15 minute rounds continue.,appears to be asleep

## 2015-07-01 NOTE — ED Notes (Signed)
Given supplies for shower
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
## 2015-07-01 NOTE — ED Notes (Signed)
Snack and beverage given. 

## 2015-07-01 NOTE — Consult Note (Signed)
  Psychiatry: Case discussed with TTS. I think we will try to refer the patient to the behavioral health Hospital or other facilities at this point rather than immediately sent her home. Referrals therefore pending. Medicines ordered.

## 2015-07-01 NOTE — ED Notes (Signed)
Pt is awake in bed this evening. Pt arrived from ED. She endorses passive SI and acknowledges feeling sad that her mother moved away to [place]. Pt mood depressed but she is cooperative with staff and contracts for safety. Plan of care discussed, food and drink provided and 15 minute checks are ongoing for safety. Pt is asleep soon after arrival on the unit.

## 2015-07-01 NOTE — ED Notes (Signed)
Pt. Noted in room. No complaints or concerns voiced. No distress or abnormal behavior noted. Will continue to monitor with security cameras. Q 15 minute rounds continue. 

## 2015-07-02 NOTE — ED Notes (Signed)
Pt. Noted in room sleeping;. No complaints or concerns voiced. No distress or abnormal behavior noted. Will continue to monitor with security cameras. Q 15 minute rounds continue. 
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
## 2015-07-02 NOTE — ED Notes (Signed)
Patient is sleeping, q 15 min. Checks, no s/s of distress. Camera monitoring in progress at all times. No contraband in room.
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
## 2015-07-02 NOTE — ED Notes (Signed)
Patient received supper tray.

## 2015-07-02 NOTE — ED Notes (Signed)
IV[facility]/Pending placement 

## 2015-07-02 NOTE — ED Provider Notes (Signed)
-----------------------------------------   7:05 AM on 07/02/2015 -----------------------------------------   Blood pressure 116/72, pulse 86, temperature 98.6 F (37 C), temperature source Oral, resp. rate 16, height 5\' 6"  (1.676 m), weight 190 lb (86.183 kg), last menstrual period 05/04/2015, SpO2 99 %.  The patient had no acute events since last update.  Calm and cooperative at this time.  Disposition is pending per Psychiatry/Behavioral Medicine team recommendations.   No events reported by overnight nurse. Patient resting comfortably at the time of my rounding. Patient is evidently not yet passed the earring she swallowed.  Delman Kitten, MD 07/02/15 [phone]

## 2015-07-02 NOTE — ED Notes (Signed)
Patient is sleeing, q 15 min checks, no s/s of distress.

## 2015-07-02 NOTE — ED Notes (Signed)
Patient is watching tv, no s/s of distress, camera monitoring and q 15 min. Checks, Nurse gave patient a comb and sit with her while she combed hair, Patient is calm, denies Si/Hi at this time.

## 2015-07-02 NOTE — ED Notes (Signed)
Patient taking shower.

## 2015-07-02 NOTE — ED Notes (Signed)
Nurse gave patient lunch tray. Patient without s/s of distress. No si/hi.

## 2015-07-02 NOTE — ED Notes (Signed)
Breakfast tray given to patient, she went to restroom and then ate breakfast.

## 2015-07-02 NOTE — ED Notes (Signed)
Report received from Ruth B., RN. Pt. Alert and oriented in no distress denies SI, HI, AVH and pain.  Pt. Instructed to come to me with problems or concerns.Will continue to monitor for safety via security cameras and Q 15 minute checks. 

## 2015-07-03 ENCOUNTER — Emergency Department: Payer: MEDICAID

## 2015-07-03 IMAGING — CR DG ABDOMEN 2V
1 series · 2 of 2 positions shown · non-contrast
Comparison: [DATE]

CLINICAL DATA: Swallowed metallic foreign body 3 days prior

EXAM:
ABDOMEN - 2 VIEW

[Series 1: w abdomen upright · 0.14mm/px · 2 of 2 slices shown]
[im 1/2]
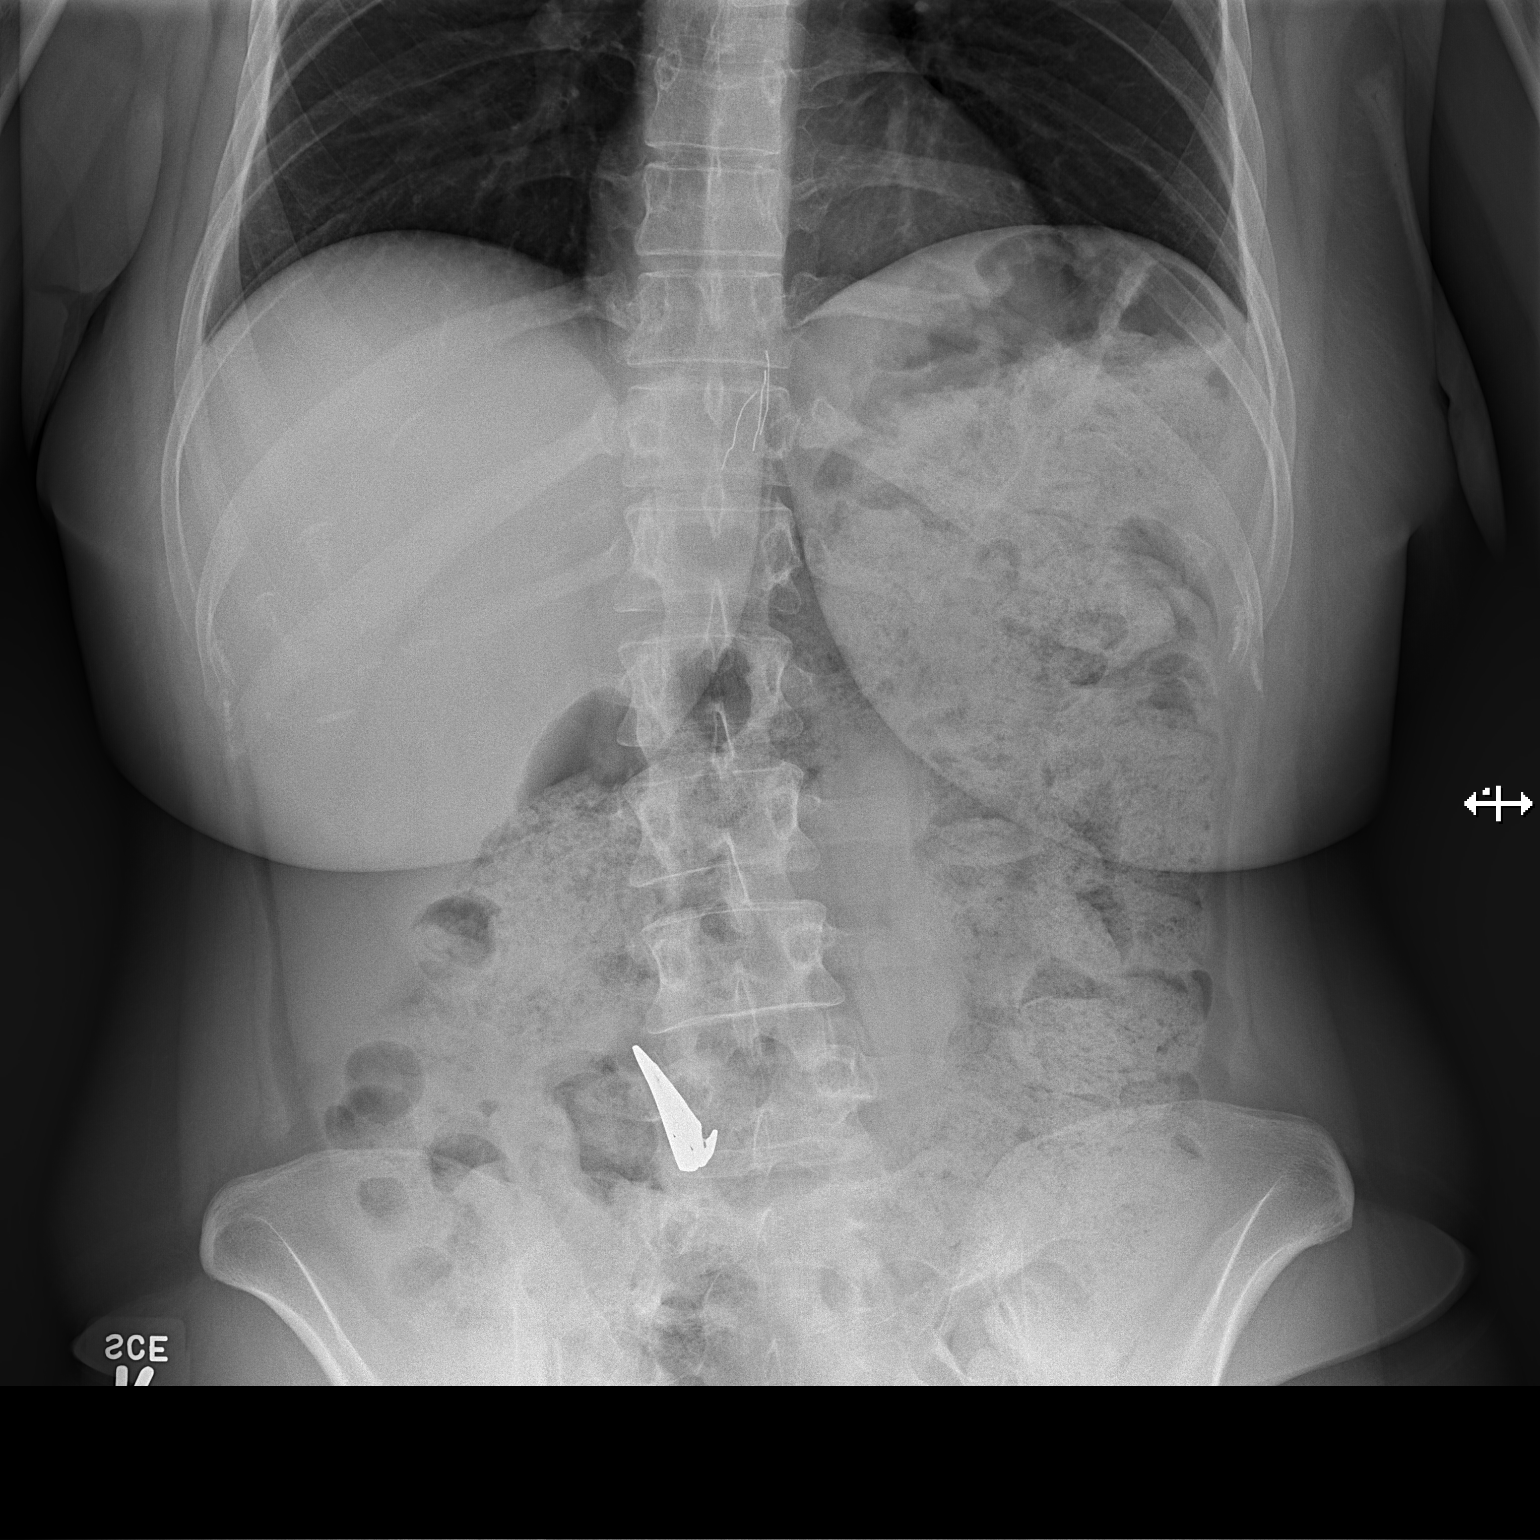
[im 2/2]
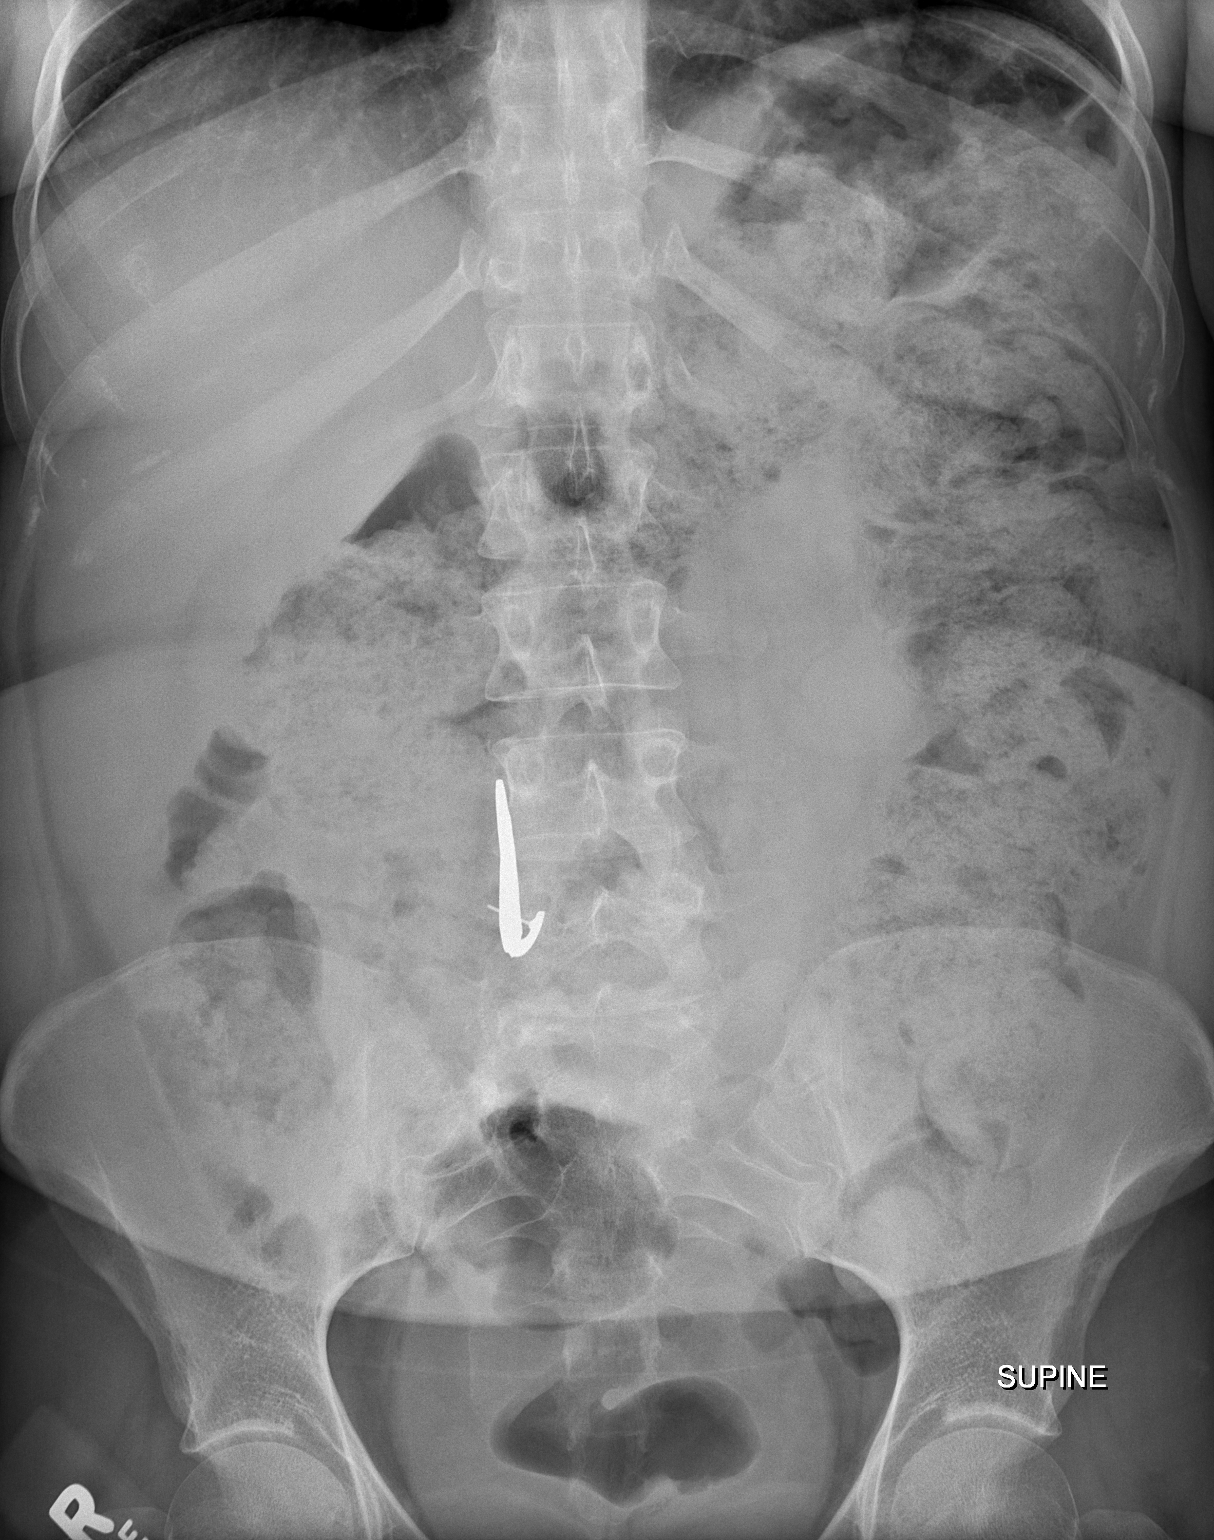

[2 of 2 positions shown; findings below may reference images not displayed]

FINDINGS: The metallic foreign body noted previously is in the lower abdomen
just to the right of midline. No free air is evident. There is
diffuse stool throughout colon. No bowel dilatation or air-fluid
level suggesting obstruction. Lung bases are clear.
IMPRESSION: The metallic foreign body is in the right medial abdomen,, likely in
mid to distal small bowel given the location. No obstruction or free
air evident. Lung bases clear. Diffuse stool is noted throughout the
colon.

## 2015-07-03 NOTE — ED Notes (Signed)
Patient is watching tv, Patient is oriented, no s/s of distress. q 15 min checks.

## 2015-07-03 NOTE — ED Notes (Signed)
Patient is oriented x 3, patient denies SI/HI/ or avh at present time, Patient is calm and cooperative, patient with q 15 min checks and camera monitoring in progress.

## 2015-07-03 NOTE — ED Notes (Signed)
Patient went for abdominal x-ray with nurse and law enforcement.

## 2015-07-03 NOTE — Discharge Instructions (Signed)
Suicidal Feelings: How to Help Yourself °Suicide is the taking of one's own life. If you feel as though life is getting too tough to handle and are thinking about suicide, get help right away. To get help: °· Call your local emergency services (911 in the [place] °· Call a suicide hotline to speak with a trained counselor who understands how you are feeling. The following is a list of suicide hotlines in the [place]. For a list of hotlines in [place], visit www.suicide.org/hotlines/international/[place]-suicide-hotlines.html. °¨  1-800-273-TALK (1-800-273-8255). °¨  1-800-SUICIDE (1-800-784-2433). °¨  1-888-628-9454. This is a hotline for Spanish speakers. °¨  1-800-799-4TTY (1-800-799-4889). This is a hotline for TTY users. °¨  1-866-4-U-TREVOR (1-866-488-7386). This is a hotline for lesbian, gay, bisexual, transgender, or questioning youth. °· Contact a crisis center or a local suicide prevention center. To find a crisis center or suicide prevention center: °¨ Call your local hospital, clinic, community service organization, mental health center, social service provider, or health department. Ask for assistance in connecting to a crisis center. °¨ Visit www.suicidepreventionlifeline.org/getinvolved/locator for a list of crisis centers in the [place], or visit www.suicideprevention.[place]/thinking-about-suicide/find-a-crisis-centre for a list of centers in [place]. °· Visit the following websites: °¨  National Suicide Prevention Lifeline: www.suicidepreventionlifeline.org °¨  Hopeline: www.hopeline.com °¨  American Foundation for Suicide Prevention: www.afsp.org °¨  The Trevor Project (for lesbian, gay, bisexual, transgender, or questioning youth): www.thetrevorproject.org °HOW CAN I HELP MYSELF FEEL BETTER? °· Promise yourself that you will not do anything drastic when you have suicidal feelings. Remember, there is hope. Many people have gotten through suicidal thoughts and feelings, and you will, too. You may  have gotten through them before, and this proves that you can get through them again. °· Let family, friends, teachers, or counselors know how you are feeling. Try not to isolate yourself from those who care about you. Remember, they will want to help you. Talk with someone every day, even if you do not feel sociable. Face-to-face conversation is best. °· Call a mental health professional and see one regularly. °· Visit your primary health care provider every year. °· Eat a well-balanced diet, and space your meals so you eat regularly. °· Get plenty of rest. °· Avoid alcohol and drugs, and remove them from your home. They will only make you feel worse. °· If you are thinking of taking a lot of medicine, give your medicine to someone who can give it to you one day at a time. If you are on antidepressants and are concerned you will overdose, let your health care provider know so he or she can give you safer medicines. Ask your mental health professional about the possible side effects of any medicines you are taking. °· Remove weapons, poisons, knives, and anything else that could harm you from your home. °· Try to stick to routines. Follow a schedule every day. Put self-care on your schedule. °· Make a list of realistic goals, and cross them off when you achieve them. Accomplishments give a sense of worth. °· Wait until you are feeling better before doing the things you find difficult or unpleasant. °· Exercise if you are able. You will feel better if you exercise for even a half hour each day. °· Go out in the sun or into nature. This will help you recover from depression faster. If you have a favorite place to walk, go there. °· Do the things that have always given you pleasure. Play your favorite music, read a good book, paint a picture, play your favorite instrument, or do anything   else that takes your mind off your depression if it is safe to do.  Keep your living space well lit.  When you are feeling well,  write yourself a letter about tips and support that you can read when you are not feeling well.  Remember that life's difficulties can be sorted out with help. Conditions can be treated. You can work on thoughts and strategies that serve you well.   This information is not intended to replace advice given to you by your health care provider. Make sure you discuss any questions you have with your health care provider.   Document Released: 11/06/2002 Document Revised: 05/23/2014 Document Reviewed: 08/27/2013 Elsevier Interactive Patient Education [employer].  Please return immediately if condition worsens. Please contact her primary physician or the physician you were given for referral. If you have any specialist physicians involved in her treatment and plan please also contact them. Thank you for using [facility].

## 2015-07-03 NOTE — ED Notes (Signed)
Patient eating breakfast. °

## 2015-07-03 NOTE — ED Notes (Signed)
Patient eating lunch, no s/s of distress.
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
## 2015-07-03 NOTE — ED Provider Notes (Signed)
-----------------------------------------   6:50 AM on 07/03/2015 -----------------------------------------   Blood pressure 106/58, pulse 90, temperature 98.3 F (36.8 C), temperature source Oral, resp. rate 20, height 5\' 6"  (1.676 m), weight 190 lb (86.183 kg), last menstrual period 05/04/2015, SpO2 98 %.  The patient had no acute events since last update.  Calm and cooperative at this time.  Disposition is pending per Psychiatry/Behavioral Medicine team recommendations.     Paulette Blanch, MD 07/03/15 [phone]

## 2015-07-03 NOTE — ED Notes (Signed)
Patient with discharge instructions, Patient voiced understanding, Patient's belongings given to patient. Patient going to be transferred via car by [place]. Patient without s/s of distress.

## 2015-07-03 NOTE — ED Notes (Signed)
Patient took shower.

## 2015-07-03 NOTE — BH Assessment (Signed)
Spoke with Psych MD (Dr. Weber Cooks) and patient is Psychiatrically Cleared.  [place] [phone]) and told the patient will be discharging. She requested prescriptions of any medication changes, so she can coordinate with her pharmacy. She also requested she be called on her cell phone [phone]), when all her paper is completed and she is ready to leave.  Spoke with Psych MD (Dr. Weber Cooks) and states their were no medication changes.  Writer called Group Home and let them no their were no changes. Also let them know, to expect a call from patient's nurse when she is ready to discharge.  Information forwarded to patient's nurse Abigail Butts).

## 2015-07-03 NOTE — ED Notes (Signed)
Patient states that she feels better and denies wanting to harm herself, patient is oriented, she is on 15 min. q checks and camera monitoring in progress.

## 2015-07-13 ENCOUNTER — Emergency Department
Admission: EM | Admit: 2015-07-13 | Discharge: 2015-07-13 | Disposition: A | Discharge status: Home / Self Care | Payer: No Typology Code available for payment source | Attending: Student | Admitting: Student

## 2015-07-13 ENCOUNTER — Encounter: Payer: Self-pay | Admitting: *Deleted

## 2015-07-13 DIAGNOSIS — Z79899 Other long term (current) drug therapy: Secondary | ICD-10-CM | POA: Insufficient documentation

## 2015-07-13 DIAGNOSIS — R45851 Suicidal ideations: Secondary | ICD-10-CM | POA: Diagnosis present

## 2015-07-13 DIAGNOSIS — F250 Schizoaffective disorder, bipolar type: Secondary | ICD-10-CM

## 2015-07-13 DIAGNOSIS — I10 Essential (primary) hypertension: Secondary | ICD-10-CM | POA: Insufficient documentation

## 2015-07-13 DIAGNOSIS — Z3202 Encounter for pregnancy test, result negative: Secondary | ICD-10-CM | POA: Diagnosis not present

## 2015-07-13 DIAGNOSIS — F17210 Nicotine dependence, cigarettes, uncomplicated: Secondary | ICD-10-CM | POA: Insufficient documentation

## 2015-07-13 LAB — CBC
HCT: 36.2 % (ref 35.0–47.0)
HEMOGLOBIN: 12.3 g/dL (ref 12.0–16.0)
MCH: 30.9 pg (ref 26.0–34.0)
MCHC: 34 g/dL (ref 32.0–36.0)
MCV: 90.7 fL (ref 80.0–100.0)
Platelets: 195 10*3/uL (ref 150–440)
RBC: 3.99 MIL/uL (ref 3.80–5.20)
RDW: 13.8 % (ref 11.5–14.5)
WBC: 7.1 10*3/uL (ref 3.6–11.0)

## 2015-07-13 LAB — COMPREHENSIVE METABOLIC PANEL
ALK PHOS: 89 U/L (ref 38–126)
ALT: 6 U/L — AB (ref 14–54)
AST: 15 U/L (ref 15–41)
Albumin: 4.2 g/dL (ref 3.5–5.0)
Anion gap: 6 (ref 5–15)
BUN: 9 mg/dL (ref 6–20)
CALCIUM: 9.5 mg/dL (ref 8.9–10.3)
CHLORIDE: 107 mmol/L (ref 101–111)
CO2: 25 mmol/L (ref 22–32)
CREATININE: 0.93 mg/dL (ref 0.44–1.00)
Glucose, Bld: 87 mg/dL (ref 65–99)
Potassium: 4.2 mmol/L (ref 3.5–5.1)
Sodium: 138 mmol/L (ref 135–145)
Total Bilirubin: 0.2 mg/dL — ABNORMAL LOW (ref 0.3–1.2)
Total Protein: 7.2 g/dL (ref 6.5–8.1)

## 2015-07-13 LAB — URINE DRUG SCREEN, QUALITATIVE (ARMC ONLY)
Amphetamines, Ur Screen: NOT DETECTED
BARBITURATES, UR SCREEN: NOT DETECTED
Benzodiazepine, Ur Scrn: NOT DETECTED
CANNABINOID 50 NG, UR ~~LOC~~: NOT DETECTED
COCAINE METABOLITE, UR ~~LOC~~: NOT DETECTED
MDMA (ECSTASY) UR SCREEN: NOT DETECTED
Methadone Scn, Ur: NOT DETECTED
Opiate, Ur Screen: NOT DETECTED
PHENCYCLIDINE (PCP) UR S: NOT DETECTED
Tricyclic, Ur Screen: NOT DETECTED

## 2015-07-13 LAB — POCT PREGNANCY, URINE: Preg Test, Ur: NEGATIVE

## 2015-07-13 LAB — SALICYLATE LEVEL: Salicylate Lvl: <4 mg/dL (ref 2.8–30.0)

## 2015-07-13 LAB — ACETAMINOPHEN LEVEL: Acetaminophen (Tylenol), Serum: 11 ug/mL (ref 10–30)

## 2015-07-13 LAB — ETHANOL

## 2015-07-13 NOTE — ED Notes (Signed)
States she is hearing voices to hurt herself and she feels like she wants to hurt herself, denies visual hallucinations, denies HI, denies drugs or ETOH

## 2015-07-13 NOTE — Progress Notes (Signed)
TTS has called [facility] [phone]), and told the representative that the patient will be discharging. She has requested that several of the pts medications be removed from her AVS @ discharge as the pt no longer takes these particular prescriptions . She also expressed that she provided the nursing staff with the pts MAR and a list of the aforementioned medications. ETA is 5:30 pm.  07/13/2015 Con Memos, MS, [place], [facility] Therapeutic Triage Specialist

## 2015-07-13 NOTE — Consult Note (Signed)
[facility] Face-to-Face Psychiatry Consult   Reason for Consult:  Consult for this 25 year old woman with a history of schizoaffective disorder brought here by her group home. Referring Physician:  Edd Fabian Patient Identification: Melanie Bell MRN:  [id number] Principal Diagnosis: Schizoaffective disorder, bipolar type [facility]) Diagnosis:   Patient Active Problem List   Diagnosis Date Noted  . Suicidal ideation [R45.851] 06/15/2015  . 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 Schizoaffective disorder, bipolar type ([place]) [F25.0] 11/06/2014  . Tardive dyskinesia [G24.01] 10/06/2014  . Tobacco use disorder [F17.200] 09/30/2014  . Hypothyroidism [E03.9] 09/29/2014  . Hypertension [I10] 09/29/2014  . Constipation [K59.00] 09/29/2014    Total Time spent with patient: 30 minutes  Subjective:   Melanie Bell is a 25 y.o. female patient admitted with "I was having thoughts of hurting myself".  HPI:  25 year old woman well known to me in the emergency room for multiple visits who was brought here voluntarily by her group home. She says that when she woke up this morning soon thereafter word she started hearing voices that were telling her to hurt herself. Telling her to swallow foreign bodies or otherwise to harm herself. Her mood is feeling nervous. She is feeling a little bit more down. She didn't sleep very well last night. She has been compliant with all her medicines and is not abusing substances. She thinks that this might have something to do with the fact that it is the time of year that a friend of hers committed suicide many years ago. Patient did not actually do anything to harm herself and denies any actual intention or desire to hurt herself.  Social history: Patient lives at a group home. Mother is guardian but lives out of state. Patient is lonely a lot of the time. Seems to be doing especially poorly since she is no longer involved in the regular daily group therapy  activity.  Medical history: History of multiple episodes of swallowing foreign bodies as a means of self-harm. Also hypothyroid high blood pressure history of chronic constipation  Substance abuse history: Has had occasional abuse of alcohol in the past but is not currently drinking or abusing any drugs.  Past Psychiatric History: Patient has had multiple hospital stays and emergency room visits with quite a few ER visits recently. She did swallowing hearing not too long ago. Didn't end up doing herself any harm from it. Patient has a diagnosis of schizoaffective disorder and has been treated with multiple medications currently seems to be reasonably stable however I think she would do better if she were going to a day program of some sort.  Risk to Self: Is patient at risk for suicide?: Yes Risk to Others:   Prior Inpatient Therapy:   Prior Outpatient Therapy:    Past Medical History:  Past Medical History  Diagnosis Date  . Hallucinations 09/30/2014    Sizoaffective  . Depression   . Anxiety   . Hypertension   . Tardive dyskinesia 10/2014    recent onset  . GERD (gastroesophageal reflux disease)   . Hyperlipidemia     Past Surgical History  Procedure Laterality Date  . Wisdom tooth extraction    . Esophagogastroduodenoscopy N/A 11/28/2014    Procedure: ESOPHAGOGASTRODUODENOSCOPY (EGD);  Surgeon: [name], MD;  Location: [facility] ENDOSCOPY;  Service: Endoscopy;  Laterality: N/A;  . Abdominal surgery      "years ago" to remove foreign objects  . Breast lumpectomy     Family History:  Family History  Problem Relation Age of Onset  . Depression Mother   . Hypertension Mother    Family Psychiatric  History: Positive family history of mood symptoms Social History:  History  Alcohol Use No     History  Drug Use No    Social History   Social History  . Marital Status: Single    Spouse Name: N/A  . Number of Children: N/A  . Years of Education: N/A   Social History  Main Topics  . Smoking status: Current Every Day Smoker -- 0.50 packs/day for 3 years    Types: Cigarettes  . Smokeless tobacco: Never Used  . Alcohol Use: No  . Drug Use: No  . Sexual Activity: No   Other Topics Concern  . None   Social History Narrative   Additional Social History:    Allergies:   Allergies  Allergen Reactions  . Betadine [Povidone Iodine] Other (See Comments)    Reaction:  Unknown   . Shellfish-Derived Products Other (See Comments)    Reaction:  Unknown   . Iodine Rash    Labs:  Results for orders placed or performed during the hospital encounter of 07/13/15 (from the past 48 hour(s))  Comprehensive metabolic panel     Status: Abnormal   Collection Time: 07/13/15  4:08 PM  Result Value Ref Range   Sodium 138 135 - 145 mmol/L   Potassium 4.2 3.5 - 5.1 mmol/L   Chloride 107 101 - 111 mmol/L   CO2 25 22 - 32 mmol/L   Glucose, Bld 87 65 - 99 mg/dL   BUN 9 6 - 20 mg/dL   Creatinine, Ser 0.93 0.44 - 1.00 mg/dL   Calcium 9.5 8.9 - 10.3 mg/dL   Total Protein 7.2 6.5 - 8.1 g/dL   Albumin 4.2 3.5 - 5.0 g/dL   AST 15 15 - 41 U/L   ALT 6 (L) 14 - 54 U/L   Alkaline Phosphatase 89 38 - 126 U/L   Total Bilirubin 0.2 (L) 0.3 - 1.2 mg/dL   GFR calc non Af Amer >60 >60 mL/min   GFR calc Af Amer >60 >60 mL/min    Comment: (NOTE) The eGFR has been calculated using the CKD EPI equation. This calculation has not been validated in all clinical situations. eGFR's persistently <60 mL/min signify possible Chronic Kidney Disease.    Anion gap 6 5 - 15  Ethanol (ETOH)     Status: None   Collection Time: 07/13/15  4:08 PM  Result Value Ref Range   Alcohol, Ethyl (B) <5 <5 mg/dL    Comment:        LOWEST DETECTABLE LIMIT FOR SERUM ALCOHOL IS 5 mg/dL FOR MEDICAL PURPOSES ONLY   Salicylate level     Status: None   Collection Time: 07/13/15  4:08 PM  Result Value Ref Range   Salicylate Lvl <9.1 2.8 - 30.0 mg/dL  Acetaminophen level     Status: None   Collection  Time: 07/13/15  4:08 PM  Result Value Ref Range   Acetaminophen (Tylenol), Serum 11 10 - 30 ug/mL    Comment:        THERAPEUTIC CONCENTRATIONS VARY SIGNIFICANTLY. A RANGE OF 10-30 ug/mL MAY BE AN EFFECTIVE CONCENTRATION FOR MANY PATIENTS. HOWEVER, SOME ARE BEST TREATED AT CONCENTRATIONS OUTSIDE THIS RANGE. ACETAMINOPHEN CONCENTRATIONS >150 ug/mL AT 4 HOURS AFTER INGESTION AND >50 ug/mL AT 12 HOURS AFTER INGESTION ARE OFTEN ASSOCIATED WITH TOXIC REACTIONS.   CBC     Status: None  Collection Time: 07/13/15  4:08 PM  Result Value Ref Range   WBC 7.1 3.6 - 11.0 K/uL   RBC 3.99 3.80 - 5.20 MIL/uL   Hemoglobin 12.3 12.0 - 16.0 g/dL   HCT 36.2 35.0 - 47.0 %   MCV 90.7 80.0 - 100.0 fL   MCH 30.9 26.0 - 34.0 pg   MCHC 34.0 32.0 - 36.0 g/dL   RDW 13.8 11.5 - 14.5 %   Platelets 195 150 - 440 K/uL  Pregnancy, urine POC     Status: None   Collection Time: 07/13/15  4:24 PM  Result Value Ref Range   Preg Test, Ur NEGATIVE NEGATIVE    Comment:        THE SENSITIVITY OF THIS METHODOLOGY IS >24 mIU/mL     No current facility-administered medications for this encounter.   Current Outpatient Prescriptions  Medication Sig Dispense Refill  . acetaminophen (TYLENOL) 325 MG tablet Take 2 tablets (650 mg total) by mouth every 6 (six) hours as needed for mild pain. 30 tablet 0  . alum & mag hydroxide-simeth (MAALOX/MYLANTA) 200-200-20 MG/5ML suspension Take 30 mLs by mouth every 4 (four) hours as needed for indigestion. 355 mL 0  . amLODipine (NORVASC) 2.5 MG tablet Take 1 tablet (2.5 mg total) by mouth daily. 30 tablet 0  . benztropine (COGENTIN) 1 MG tablet Take 1 tablet (1 mg total) by mouth at bedtime. 30 tablet 0  . docusate sodium (COLACE) 100 MG capsule Take 2 capsules (200 mg total) by mouth 2 (two) times daily. 120 capsule 0  . fluPHENAZine (PROLIXIN) 5 MG tablet Take 1 tablet (5 mg total) by mouth 3 (three) times daily. 90 tablet 0  . ibuprofen (ADVIL,MOTRIN) 800 MG tablet Take  800 mg by mouth every 8 (eight) hours as needed.    Marland Kitchen levothyroxine (SYNTHROID, LEVOTHROID) 75 MCG tablet Take 1 tablet (75 mcg total) by mouth daily before breakfast. 30 tablet 0  . Lurasidone HCl 120 MG TABS Take 1 tablet (120 mg total) by mouth daily with supper. 30 tablet 0  . OXcarbazepine (TRILEPTAL) 150 MG tablet Take 1 tablet (150 mg total) by mouth 2 (two) times daily. 60 tablet 0  . temazepam (RESTORIL) 15 MG capsule Take 1 capsule (15 mg total) by mouth at bedtime as needed for sleep. 30 capsule 0  . traZODone (DESYREL) 50 MG tablet Take 1 tablet (50 mg total) by mouth at bedtime. 30 tablet 0  . venlafaxine XR (EFFEXOR-XR) 150 MG 24 hr capsule Take 1 capsule (150 mg total) by mouth daily with breakfast. 30 capsule 0    Musculoskeletal: Strength & Muscle Tone: within normal limits Gait & Station: normal Patient leans: N/A  Psychiatric Specialty Exam: Review of Systems  Constitutional: Negative.   HENT: Negative.   Eyes: Negative.   Respiratory: Negative.   Cardiovascular: Negative.   Gastrointestinal: Negative.   Musculoskeletal: Negative.   Skin: Negative.   Neurological: Negative.   Psychiatric/Behavioral: Positive for depression, hallucinations and memory loss. Negative for suicidal ideas and substance abuse. The patient is nervous/anxious. The patient does not have insomnia.     Blood pressure 133/80, pulse 74, temperature 98.7 F (37.1 C), temperature source Oral, resp. rate 18, height _0  (1.676 m), weight 88.451 kg (195 lb), last menstrual period 05/04/2015, SpO2 98 %.Body mass index is 31.49 kg/(m^2).  General Appearance: Casual  Eye Contact::  Good  Speech:  Slow  Volume:  Decreased  Mood:  Anxious  Affect:  Flat  Thought  Process:  Goal Directed  Orientation:  Full (Time, Place, and Person)  Thought Content:  Hallucinations: Auditory  Suicidal Thoughts:  No  Homicidal Thoughts:  No  Memory:  Immediate;   Fair Recent;   Fair Remote;   Fair  Judgement:   Fair  Insight:  Fair  Psychomotor Activity:  Decreased  Concentration:  Fair  Recall:  [employer] of Knowledge:Good  Language: Fair  Akathisia:  No  Handed:  Right  AIMS (if indicated):     Assets:  Financial Resources/Insurance Housing Social Support  ADL's:  Intact  Cognition: Impaired,  Mild  Sleep:      Treatment Plan Summary: Plan 25 year old woman who had hallucinations and thoughts about self-harm but no intention or plan to do it. This is pretty much baseline for her. Patient says that she did not actually want to calm here but that her group home insisted. Based on multiple previous encounter she is unlikely to benefit from treatment at this time. I do think that she needs a more intensive outpatient plan including a day program and more frequent visits with providers and possibly an act team. Patient at this point does not require hospital level treatment and is agreeable to going home. Case reviewed with emergency room doctor and TTS. Patient should be picked up by her group home and taken back home with follow-up in the community. She is agreeable to the plan. Does not appear to be acutely dangerous or meet commitment criteria.  Disposition: Patient does not meet criteria for psychiatric inpatient admission. Supportive therapy provided about ongoing stressors.  Alethia Berthold, MD 07/13/2015 5:09 PM

## 2015-07-13 NOTE — ED Provider Notes (Addendum)
[facility] Emergency Department Provider Note  ____________________________________________  Time seen: Approximately 5:00 PM  I have reviewed the triage vital signs and the nursing notes.   HISTORY  Chief Complaint Suicidal    HPI Melanie Bell is a 25 y.o. female with history of schizoaffective disorder who presents for evaluation of auditory hallucinations and suicidal ideation, gradual onset, ongoing for some time, constant, no modifying factors. She was sent here by her group home for evaluation. She states she hears voices telling her to hurt herself and she does not want to hurt herself. No homicidal ideation. No visualization. No recent illness include no cough, sneezing, runny nose, vomiting, diarrhea, fevers or chills. No chest pain, sob, or abdominal pain.   Past Medical History  Diagnosis Date  . Hallucinations 09/30/2014    Sizoaffective  . Depression   . Anxiety   . Hypertension   . Tardive dyskinesia 10/2014    recent onset  . GERD (gastroesophageal reflux disease)   . Hyperlipidemia     Patient Active Problem List   Diagnosis Date Noted  . Suicidal ideation 06/15/2015  . 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 Schizoaffective disorder, bipolar type ([place]) 11/06/2014  . Tardive dyskinesia 10/06/2014  . Tobacco use disorder 09/30/2014  . Hypothyroidism 09/29/2014  . Hypertension 09/29/2014  . Constipation 09/29/2014    Past Surgical History  Procedure Laterality Date  . Wisdom tooth extraction    . Esophagogastroduodenoscopy N/A 11/28/2014    Procedure: ESOPHAGOGASTRODUODENOSCOPY (EGD);  Surgeon: [name], MD;  Location: [facility] END[facility]COPY;  Service: Endoscopy;  Laterality: N/A;  . Abdominal surgery      "years ago" to remove foreign objects  . Breast lumpectomy      Current Outpatient Rx  Name  Route  Sig  Dispense  Refill  . acetaminophen (TYLENOL) 325 MG tablet   Oral   Take 2 tablets (650 mg total) by mouth  every 6 (six) hours as needed for mild pain.   30 tablet   0   . alum & mag hydroxide-simeth (MAALOX/MYLANTA) 200-200-20 MG/5ML suspension   Oral   Take 30 mLs by mouth every 4 (four) hours as needed for indigestion.   355 mL   0   . amLODipine (NORVASC) 2.5 MG tablet   Oral   Take 1 tablet (2.5 mg total) by mouth daily.   30 tablet   0   . benztropine (COGENTIN) 1 MG tablet   Oral   Take 1 tablet (1 mg total) by mouth at bedtime.   30 tablet   0   . docusate sodium (COLACE) 100 MG capsule   Oral   Take 2 capsules (200 mg total) by mouth 2 (two) times daily.   120 capsule   0   . fluPHENAZine (PROLIXIN) 5 MG tablet   Oral   Take 1 tablet (5 mg total) by mouth 3 (three) times daily.   90 tablet   0   . ibuprofen (ADVIL,MOTRIN) 800 MG tablet   Oral   Take 800 mg by mouth every 8 (eight) hours as needed.         Marland Kitchen levothyroxine (SYNTHROID, LEVOTHROID) 75 MCG tablet   Oral   Take 1 tablet (75 mcg total) by mouth daily before breakfast.   30 tablet   0   . Lurasidone HCl 120 MG TABS   Oral   Take 1 tablet (120 mg total) by mouth daily with supper.   30 tablet  0   . OXcarbazepine (TRILEPTAL) 150 MG tablet   Oral   Take 1 tablet (150 mg total) by mouth 2 (two) times daily.   60 tablet   0   . temazepam (RESTORIL) 15 MG capsule   Oral   Take 1 capsule (15 mg total) by mouth at bedtime as needed for sleep.   30 capsule   0   . traZODone (DESYREL) 50 MG tablet   Oral   Take 1 tablet (50 mg total) by mouth at bedtime.   30 tablet   0   . venlafaxine XR (EFFEXOR-XR) 150 MG 24 hr capsule   Oral   Take 1 capsule (150 mg total) by mouth daily with breakfast.   30 capsule   0     Allergies Betadine; Shellfish-derived products; and Iodine  Family History  Problem Relation Age of Onset  . Depression Mother   . Hypertension Mother     Social History Social History  Substance Use Topics  . Smoking status: Current Every Day Smoker -- 0.50  packs/day for 3 years    Types: Cigarettes  . Smokeless tobacco: Never Used  . Alcohol Use: No    Review of Systems Constitutional: No fever/chills Eyes: No visual changes. ENT: No sore throat. Cardiovascular: Denies chest pain. Respiratory: Denies shortness of breath. Gastrointestinal: No abdominal pain.  No nausea, no vomiting.  No diarrhea.  No constipation. Genitourinary: Negative for dysuria. Musculoskeletal: Negative for back pain. Skin: Negative for rash. Neurological: Negative for headaches, focal weakness or numbness.  10-point ROS otherwise negative.  ____________________________________________   PHYSICAL EXAM:  VITAL SIGNS: ED Triage Vitals  Enc Vitals Group     BP 07/13/15 1605 133/80 mmHg     Pulse Rate 07/13/15 1605 74     Resp 07/13/15 1605 18     Temp 07/13/15 1605 98.7 F (37.1 C)     Temp Source 07/13/15 1605 Oral     SpO2 07/13/15 1605 98 %     Weight 07/13/15 1605 195 lb (88.451 kg)     Height 07/13/15 1605 5\' 6"  (1.676 m)     Head Cir --      Peak Flow --      Pain Score 07/13/15 1605 0     Pain Loc --      Pain Edu? --      Excl. in [place]? --     Constitutional: Alert and oriented. Well appearing and in no acute distress. Eyes: Conjunctivae are normal. PERRL. EOMI. Head: Atraumatic. Nose: No congestion/rhinnorhea. Mouth/Throat: Mucous membranes are moist.  Oropharynx non-erythematous. Neck: No stridor.   Cardiovascular: Normal rate, regular rhythm. Grossly normal heart sounds.  Good peripheral circulation. Respiratory: Normal respiratory effort.  No retractions. Lungs CTAB. Gastrointestinal: Soft and nontender. No distention. No CVA tenderness. Genitourinary: deferred Musculoskeletal: No lower extremity tenderness nor edema.  No joint effusions. Neurologic:  Normal speech and language. No gross focal neurologic deficits are appreciated. No gait instability. Skin:  Skin is warm, dry and intact. No rash noted. Psychiatric: Mood is normal,  affect is restricted. Speech and behavior are normal.  ____________________________________________   LABS (all labs ordered are listed, but only abnormal results are displayed)  Labs Reviewed  COMPREHENSIVE METABOLIC PANEL - Abnormal; Notable for the following:    ALT 6 (*)    Total Bilirubin 0.2 (*)    All other components within normal limits  ETHANOL  SALICYLATE LEVEL  ACETAMINOPHEN LEVEL  CBC  URINE DRUG SCREEN, QUALITATIVE (  [facility] ONLY)  POCT PREGNANCY, URINE   ____________________________________________  EKG  none ____________________________________________  RADIOLOGY  none ____________________________________________   PROCEDURES  Procedure(s) performed: None  Critical Care performed: No  ____________________________________________   INITIAL IMPRESSION / ASSESSMENT AND PLAN / ED COURSE  Pertinent labs & imaging results that were available during my care of the patient were reviewed by me and considered in my medical decision making (see chart for details).  Melanie Bell is a 25 y.o. female with history of schizoaffective disorder who presents for evaluation of auditory hallucinations and suicidal ideation which are chronic. She reports she does not want to actually hurt herself. On exam she is well-appearing and in no acute distress. She has no acute medical complaints. Vital signs are stable, she is afebrile and she has a benign physical examination. Screening psych labs were reviewed and are unremarkable. She is medically cleared. Dr. Weber Cooks of psychiatry has evaluated the patient and recommends discharge with outpatient follow-up. DC return precautions and close outpatient follow-up and she is comfortable with the discharge plan. Her group home will be coming to pick her up shortly. Of note, the patient's temazepam was discontinued by her primary provider and her group home is requesting an updated list of her medications so I have made a  Note that the  patient no longer takes temazepam. ____________________________________________   FINAL CLINICAL IMPRESSION(S) / ED DIAGNOSES  Final diagnoses:  Schizoaffective disorder, bipolar type ([facility])       Joanne Gavel, MD 07/13/15 1743  Joanne Gavel, MD 07/13/15 1746

## 2015-07-30 ENCOUNTER — Encounter: Payer: Self-pay | Admitting: Emergency Medicine

## 2015-07-30 ENCOUNTER — Inpatient Hospital Stay
Admission: EM | Admit: 2015-07-30 | Discharge: 2015-08-05 | DRG: 394 | Disposition: A | Discharge status: Other Acute Inpatient Hospital | Payer: Medicaid Other | Attending: Internal Medicine | Admitting: Internal Medicine

## 2015-07-30 ENCOUNTER — Emergency Department: Payer: Medicaid Other

## 2015-07-30 DIAGNOSIS — K5900 Constipation, unspecified: Secondary | ICD-10-CM | POA: Diagnosis present

## 2015-07-30 DIAGNOSIS — R45851 Suicidal ideations: Secondary | ICD-10-CM | POA: Diagnosis present

## 2015-07-30 DIAGNOSIS — E785 Hyperlipidemia, unspecified: Secondary | ICD-10-CM | POA: Diagnosis present

## 2015-07-30 DIAGNOSIS — Z79899 Other long term (current) drug therapy: Secondary | ICD-10-CM

## 2015-07-30 DIAGNOSIS — F17210 Nicotine dependence, cigarettes, uncomplicated: Secondary | ICD-10-CM | POA: Diagnosis present

## 2015-07-30 DIAGNOSIS — R1084 Generalized abdominal pain: Secondary | ICD-10-CM

## 2015-07-30 DIAGNOSIS — G2401 Drug induced subacute dyskinesia: Secondary | ICD-10-CM | POA: Diagnosis present

## 2015-07-30 DIAGNOSIS — E039 Hypothyroidism, unspecified: Secondary | ICD-10-CM | POA: Diagnosis present

## 2015-07-30 DIAGNOSIS — X58XXXA Exposure to other specified factors, initial encounter: Secondary | ICD-10-CM | POA: Diagnosis present

## 2015-07-30 DIAGNOSIS — G4700 Insomnia, unspecified: Secondary | ICD-10-CM | POA: Diagnosis present

## 2015-07-30 DIAGNOSIS — T188XXA Foreign body in other parts of alimentary tract, initial encounter: Principal | ICD-10-CM | POA: Diagnosis present

## 2015-07-30 DIAGNOSIS — F329 Major depressive disorder, single episode, unspecified: Secondary | ICD-10-CM | POA: Diagnosis present

## 2015-07-30 DIAGNOSIS — Z915 Personal history of self-harm: Secondary | ICD-10-CM

## 2015-07-30 DIAGNOSIS — I10 Essential (primary) hypertension: Secondary | ICD-10-CM | POA: Diagnosis present

## 2015-07-30 DIAGNOSIS — F250 Schizoaffective disorder, bipolar type: Secondary | ICD-10-CM | POA: Diagnosis present

## 2015-07-30 DIAGNOSIS — Z91041 Radiographic dye allergy status: Secondary | ICD-10-CM

## 2015-07-30 DIAGNOSIS — T189XXA Foreign body of alimentary tract, part unspecified, initial encounter: Secondary | ICD-10-CM | POA: Diagnosis present

## 2015-07-30 LAB — SALICYLATE LEVEL: Salicylate Lvl: <4 mg/dL (ref 2.8–30.0)

## 2015-07-30 LAB — URINE DRUG SCREEN, QUALITATIVE (ARMC ONLY)
Amphetamines, Ur Screen: NOT DETECTED
Barbiturates, Ur Screen: NOT DETECTED
Benzodiazepine, Ur Scrn: NOT DETECTED
CANNABINOID 50 NG, UR ~~LOC~~: NOT DETECTED
COCAINE METABOLITE, UR ~~LOC~~: NOT DETECTED
MDMA (ECSTASY) UR SCREEN: NOT DETECTED
Methadone Scn, Ur: NOT DETECTED
OPIATE, UR SCREEN: NOT DETECTED
PHENCYCLIDINE (PCP) UR S: NOT DETECTED
TRICYCLIC, UR SCREEN: NOT DETECTED

## 2015-07-30 LAB — CBC
HEMATOCRIT: 39 % (ref 35.0–47.0)
HEMOGLOBIN: 13.1 g/dL (ref 12.0–16.0)
MCH: 30.8 pg (ref 26.0–34.0)
MCHC: 33.5 g/dL (ref 32.0–36.0)
MCV: 91.9 fL (ref 80.0–100.0)
Platelets: 209 10*3/uL (ref 150–440)
RBC: 4.25 MIL/uL (ref 3.80–5.20)
RDW: 13.5 % (ref 11.5–14.5)
WBC: 7.8 10*3/uL (ref 3.6–11.0)

## 2015-07-30 LAB — COMPREHENSIVE METABOLIC PANEL
ALBUMIN: 4.4 g/dL (ref 3.5–5.0)
ALK PHOS: 90 U/L (ref 38–126)
ALT: 8 U/L — AB (ref 14–54)
AST: 17 U/L (ref 15–41)
Anion gap: 5 (ref 5–15)
BILIRUBIN TOTAL: 0.3 mg/dL (ref 0.3–1.2)
BUN: 8 mg/dL (ref 6–20)
CALCIUM: 9.5 mg/dL (ref 8.9–10.3)
CO2: 24 mmol/L (ref 22–32)
CREATININE: 0.81 mg/dL (ref 0.44–1.00)
Chloride: 111 mmol/L (ref 101–111)
GFR calc Af Amer: >60 mL/min (ref >60)
GFR calc non Af Amer: >60 mL/min (ref >60)
GLUCOSE: 64 mg/dL — AB (ref 65–99)
Potassium: 4.2 mmol/L (ref 3.5–5.1)
SODIUM: 140 mmol/L (ref 135–145)
TOTAL PROTEIN: 7.7 g/dL (ref 6.5–8.1)

## 2015-07-30 LAB — ACETAMINOPHEN LEVEL: Acetaminophen (Tylenol), Serum: <10 ug/mL — ABNORMAL LOW (ref 10–30)

## 2015-07-30 LAB — ETHANOL: Alcohol, Ethyl (B): <5 mg/dL (ref <5)

## 2015-07-30 LAB — POCT PREGNANCY, URINE: Preg Test, Ur: NEGATIVE

## 2015-07-30 IMAGING — CR DG ABDOMEN ACUTE W/ 1V CHEST
1 series · 4 of 4 positions shown · non-contrast
Comparison: [DATE]

CLINICAL DATA: Patient complains of low abdomen pain after swelling
a screw yesterday.

EXAM:
DG ABDOMEN ACUTE W/ 1V CHEST

[Series 1: dg abd acute w/chest · 0.14mm/px · 4 of 4 slices shown]
[im 1/4]
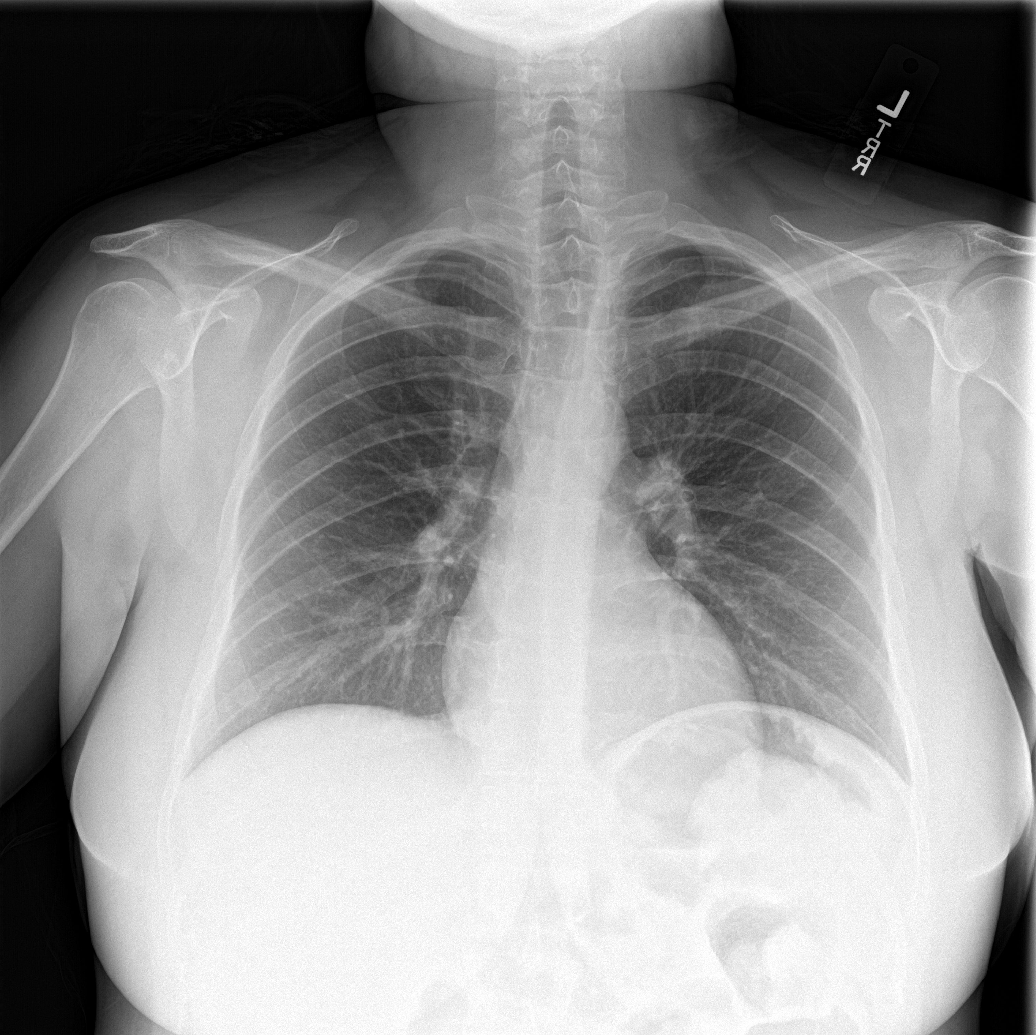
[im 2/4]
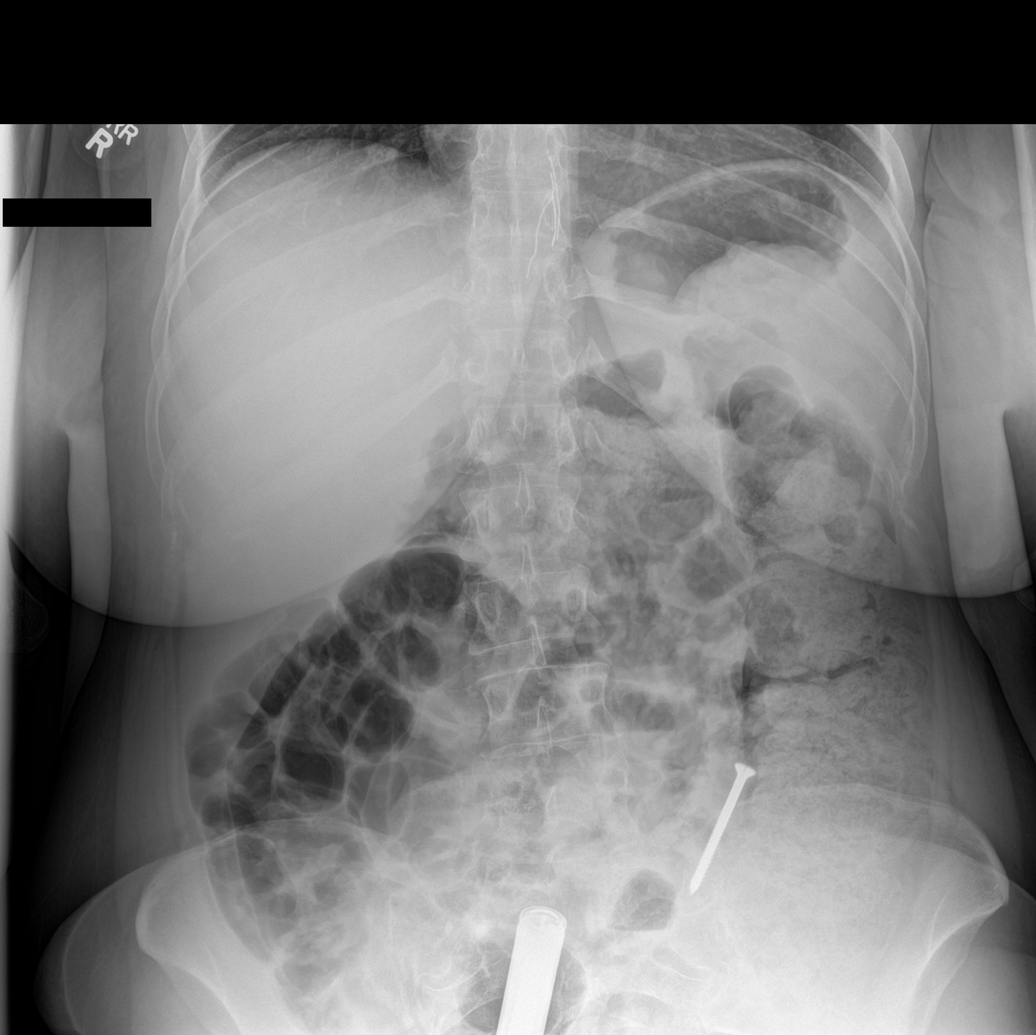
[im 3/4]
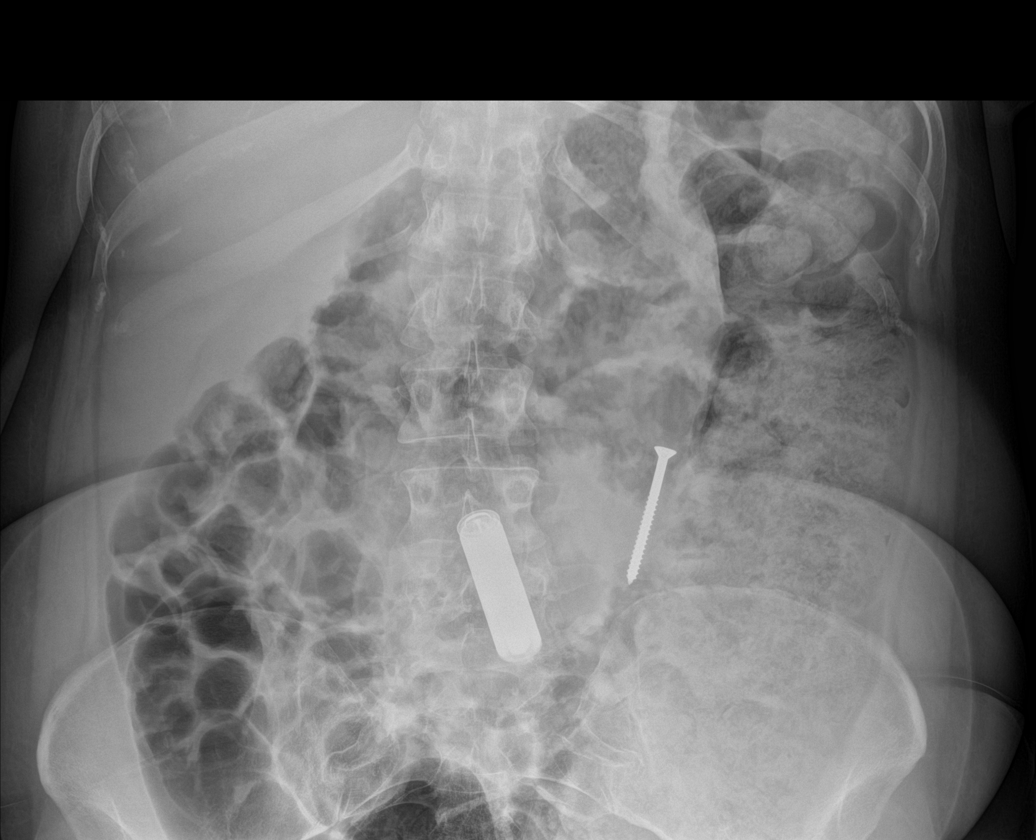
[im 4/4]
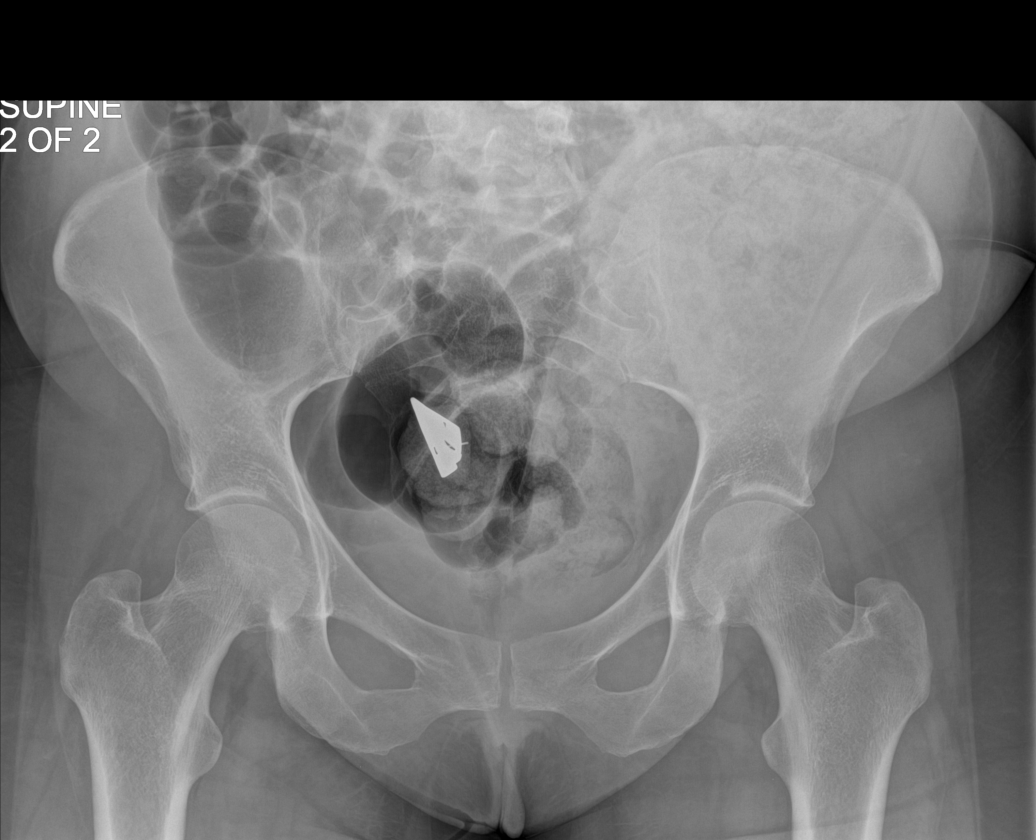

[4 of 4 positions shown; findings below may reference images not displayed]

FINDINGS: There is no evidence of dilated bowel loops or free intraperitoneal
air. There is a radiopaque screw projected in the lower left
abdomen. A second triangular-shaped radiopaque density is projected
in the pelvis. There is a third cylinder like radiopaque density
projected in the mid lower abdomen. Heart size and mediastinal
contours are within normal limits. Both lungs are clear.
IMPRESSION: Three radiopaque foreign bodies projected in the lower abdomen and
pelvis. There is no free air.

## 2015-07-30 NOTE — ED Notes (Signed)
Patient states "I am hearing voices that are telling me to swallow safety pins:.  STates voices returned this morning.    Spoke with Joaquim Lai (caregiver from group home) who states that patient has been fine all day, receiving all medications and states that patient likes all of the "soda and hamburgers" she gets while in the hospital.  Group home will not be able to pick patient up this evening past 2000.

## 2015-07-30 NOTE — ED Notes (Addendum)
Patient observed resting in ED 20 with NAD noted. Patient calm and cooperative with no evidence of escalating behaviors. No anticipated needs at this time. Will continue to monitor.

## 2015-07-30 NOTE — ED Notes (Signed)
Patient states she swallowed a screw yesterday while at the group home.

## 2015-07-30 NOTE — ED Provider Notes (Signed)
[facility] Emergency Department Provider Note  Time seen: 7:26 PM  I have reviewed the triage vital signs and the nursing notes.   HISTORY  Chief Complaint Mental Health Problem    HPI Melanie Bell is a 25 y.o. female with a past medical history of depression, gastric reflux, anxiety, presents the emergency department stating that she is hearing voices. Patient lives at a group home, told staff today she is hearing voices telling her to swallow and open safety pin. Patient states she swallowed a screw yesterday states voices told her to do it.Patient denies any SI or HI. States occasional abdominal pain. Denies any currently.     Past Medical History  Diagnosis Date  . Hallucinations 09/30/2014    Sizoaffective  . Depression   . Anxiety   . Hypertension   . Tardive dyskinesia 10/2014    recent onset  . GERD (gastroesophageal reflux disease)   . Hyperlipidemia     Patient Active Problem List   Diagnosis Date Noted  . Suicidal ideation 06/15/2015  . 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 Schizoaffective disorder, bipolar type ([place]) 11/06/2014  . Tardive dyskinesia 10/06/2014  . Tobacco use disorder 09/30/2014  . Hypothyroidism 09/29/2014  . Hypertension 09/29/2014  . Constipation 09/29/2014    Past Surgical History  Procedure Laterality Date  . Wisdom tooth extraction    . Esophagogastroduodenoscopy N/A 11/28/2014    Procedure: ESOPHAGOGASTRODUODENOSCOPY (EGD);  Surgeon: [name], MD;  Location: [facility] ENDOSCOPY;  Service: Endoscopy;  Laterality: N/A;  . Abdominal surgery      "years ago" to remove foreign objects  . Breast lumpectomy      Current Outpatient Rx  Name  Route  Sig  Dispense  Refill  . acetaminophen (TYLENOL) 325 MG tablet   Oral   Take 2 tablets (650 mg total) by mouth every 6 (six) hours as needed for mild pain.   30 tablet   0   . alum & mag hydroxide-simeth (MAALOX/MYLANTA) 200-200-20 MG/5ML  suspension   Oral   Take 30 mLs by mouth every 4 (four) hours as needed for indigestion.   355 mL   0   . amLODipine (NORVASC) 2.5 MG tablet   Oral   Take 1 tablet (2.5 mg total) by mouth daily.   30 tablet   0   . benztropine (COGENTIN) 1 MG tablet   Oral   Take 1 tablet (1 mg total) by mouth at bedtime.   30 tablet   0   . docusate sodium (COLACE) 100 MG capsule   Oral   Take 2 capsules (200 mg total) by mouth 2 (two) times daily.   120 capsule   0   . fluPHENAZine (PROLIXIN) 5 MG tablet   Oral   Take 1 tablet (5 mg total) by mouth 3 (three) times daily.   90 tablet   0   . ibuprofen (ADVIL,MOTRIN) 800 MG tablet   Oral   Take 800 mg by mouth every 8 (eight) hours as needed.         Marland Kitchen levothyroxine (SYNTHROID, LEVOTHROID) 75 MCG tablet   Oral   Take 1 tablet (75 mcg total) by mouth daily before breakfast.   30 tablet   0   . Lurasidone HCl 120 MG TABS   Oral   Take 1 tablet (120 mg total) by mouth daily with supper.   30 tablet   0   . OXcarbazepine (TRILEPTAL) 150 MG tablet  Oral   Take 1 tablet (150 mg total) by mouth 2 (two) times daily.   60 tablet   0   . traZODone (DESYREL) 50 MG tablet   Oral   Take 1 tablet (50 mg total) by mouth at bedtime.   30 tablet   0   . venlafaxine XR (EFFEXOR-XR) 150 MG 24 hr capsule   Oral   Take 1 capsule (150 mg total) by mouth daily with breakfast.   30 capsule   0     Allergies Betadine; Shellfish-derived products; and Iodine  Family History  Problem Relation Age of Onset  . Depression Mother   . Hypertension Mother     Social History Social History  Substance Use Topics  . Smoking status: Current Every Day Smoker -- 0.50 packs/day for 3 years    Types: Cigarettes  . Smokeless tobacco: Never Used  . Alcohol Use: No    Review of Systems Constitutional: Negative for fever. Cardiovascular: Negative for chest pain. Respiratory: Negative for shortness of breath. Gastrointestinal: Occasional  abdominal pain. Denies nausea, vomiting, diarrhea. Genitourinary: Negative for dysuria. Musculoskeletal: Negative for back pain. Neurological: Negative for headache 10-point ROS otherwise negative.  ____________________________________________   PHYSICAL EXAM:  VITAL SIGNS: ED Triage Vitals  Enc Vitals Group     BP 07/30/15 1833 126/82 mmHg     Pulse Rate 07/30/15 1833 94     Resp 07/30/15 1833 16     Temp 07/30/15 1833 98.7 F (37.1 C)     Temp Source 07/30/15 1833 Oral     SpO2 07/30/15 1833 99 %     Weight 07/30/15 1833 194 lb (87.998 kg)     Height 07/30/15 1833 5\' 6"  (1.676 m)     Head Cir --      Peak Flow --      Pain Score 07/30/15 1833 0     Pain Loc --      Pain Edu? --      Excl. in [place]? --     Constitutional: Alert and oriented. Well appearing and in no distress. Eyes: Normal exam ENT   Head: Normocephalic and atraumatic.   Mouth/Throat: Mucous membranes are moist. Cardiovascular: Normal rate, regular rhythm. No murmur Respiratory: Normal respiratory effort without tachypnea nor retractions. Breath sounds are clear  Gastrointestinal: Soft and nontender. No distention.  No reaction to abdominal palpation. Musculoskeletal: Nontender with normal range of motion in all extremities. Neurologic:  Normal speech and language. No gross focal neurologic deficits Skin:  Skin is warm, dry and intact.  Psychiatric: Mood and affect are normal.   ____________________________________________   RADIOLOGY  X-ray consistent with 3 radiopaque foreign bodies. No free air.  ____________________________________________   INITIAL IMPRESSION / ASSESSMENT AND PLAN / ED COURSE  Pertinent labs & imaging results that were available during my care of the patient were reviewed by me and considered in my medical decision making (see chart for details).  Patient presents the emergency department stating she is hearing voices telling her to swallow objects. Patient states  she swallowed a metal screw yesterday because the voices told her to do so. Patient denies any active SI or HI. Denies alcohol or substance use. Does state occasional abdominal pain but denies any currently and has a nontender exam. We will check labs, have psychiatry and TTS see the patient. We'll obtain x-rays to evaluate for foreign objects.  X-ray consistent with 3 radiopaque foreign bodies without free air. Patient has a nontender exam. Labs are  otherwise within normal limits. Patient placed on an involuntary commitment given x-ray findings an voices telling her to swallow objects. I discussed the patient with Dr. Pat Patrick, who recommends watchful waiting, the patient were to develop abdominal pain would require repeat imaging. Given the patient's acute psychiatric condition I believe she will likely require inpatient psychiatric treatment however patient will not be able to be medically cleared until the foreign bodies past which may necessitate a prolonged emergency department stay. Currently awaiting psychiatric evaluation. ____________________________________________   FINAL CLINICAL IMPRESSION(S) / ED DIAGNOSES  Swallowed foreign body Auditory hallucinations.   Harvest Dark, MD 07/30/15 2137

## 2015-07-30 NOTE — BH Assessment (Signed)
Assessment Note  Melanie Bell is an 25 y.o. female presenting to the ED via her group home ([place] [phone]) with concerns with auditory hallucinations, which are command in nature.  Pt reports hearing voices telling her to swallow things.  She reports she swallowed a screw yesterday while at the group home.  She denies having a suicidal plan or intent.  She report she continues to have feelings of depression and has also been experiencing symptoms of anxiety.  She denies any drug/alcohol use.  Diagnosis: Schizoaffective Disorder  Past Medical History:  Past Medical History  Diagnosis Date  . Hallucinations 09/30/2014    Sizoaffective  . Depression   . Anxiety   . Hypertension   . Tardive dyskinesia 10/2014    recent onset  . GERD (gastroesophageal reflux disease)   . Hyperlipidemia     Past Surgical History  Procedure Laterality Date  . Wisdom tooth extraction    . Esophagogastroduodenoscopy N/A 11/28/2014    Procedure: ESOPHAGOGASTRODUODENOSCOPY (EGD);  Surgeon: [name], MD;  Location: [facility] ENDOSCOPY;  Service: Endoscopy;  Laterality: N/A;  . Abdominal surgery      "years ago" to remove foreign objects  . Breast lumpectomy      Family History:  Family History  Problem Relation Age of Onset  . Depression Mother   . Hypertension Mother     Social History:  reports that she has been smoking Cigarettes.  She has a 1.5 pack-year smoking history. She has never used smokeless tobacco. She reports that she does not drink alcohol or use illicit drugs.  Additional Social History:  Alcohol / Drug Use History of alcohol / drug use?: No history of alcohol / drug abuse  CIWA: CIWA-Ar BP: 126/82 mmHg Pulse Rate: 94 COWS:    Allergies:  Allergies  Allergen Reactions  . Betadine [Povidone Iodine] Other (See Comments)    Reaction:  Unknown   . Shellfish-Derived Products Other (See Comments)    Reaction:  Unknown   . Iodine Rash    Home  Medications:  (Not in a hospital admission)  OB/GYN Status:  No LMP recorded. Patient has had an injection.  General Assessment Data Location of Assessment: [facility] ED TTS Assessment: In system Is this a Tele or Face-to-Face Assessment?: Face-to-Face Is this an Initial Assessment or a Re-assessment for this encounter?: Initial Assessment Marital status: Single Maiden name: N/A Is patient pregnant?: No Pregnancy Status: No Living Arrangements: Group Home [facility] [phone]) Can pt return to current living arrangement?: Yes Admission Status: Voluntary Is patient capable of signing voluntary admission?: Yes Referral Source: Self/Family/Friend Insurance type: Medicaid  Medical Screening Exam ([place]) Medical Exam completed: Yes  Crisis Care Plan Living Arrangements: Group Home ([place] [phone]) Legal Guardian: Mother [name]) Name of Psychiatrist: Dr. Rushie Chestnut  Education Status Is patient currently in school?: No Current Grade: N/A Highest grade of school patient has completed: GED Name of school: n/a Contact person: n/a  Risk to self with the past 6 months Suicidal Ideation: No Has patient been a risk to self within the past 6 months prior to admission? : Yes Suicidal Intent: No Has patient had any suicidal intent within the past 6 months prior to admission? : Yes Is patient at risk for suicide?: No Suicidal Plan?: No Has patient had any suicidal plan within the past 6 months prior to admission? : Yes Access to Means: Yes Specify Access to Suicidal Means: Pt has  access to objects in the group home What has been your use of drugs/alcohol within the last 12 months?: None reported Previous Attempts/Gestures: Yes How many times?: 11 Other Self Harm Risks: History of cutting, swallowing objects Triggers for Past Attempts: Hallucinations, Family contact, Other personal contacts Intentional Self Injurious Behavior:  Cutting Comment - Self Injurious Behavior: Pt has history of cutting but hasn't done so in the past 5 years. Family Suicide History: Unknown Recent stressful life event(s): Other (Comment) (Hallucinations) Persecutory voices/beliefs?: Yes Depression: Yes Depression Symptoms: Loss of interest in usual pleasures, Feeling worthless/self pity, Feeling angry/irritable, Fatigue Substance abuse history and/or treatment for substance abuse?: No Suicide prevention information given to non-admitted patients: Not applicable  Risk to Others within the past 6 months Homicidal Ideation: No Does patient have any lifetime risk of violence toward others beyond the six months prior to admission? : No Thoughts of Harm to Others: No Current Homicidal Intent: No Current Homicidal Plan: No Access to Homicidal Means: No Identified Victim: None reported History of harm to others?: No Assessment of Violence: None Noted Violent Behavior Description: None reported Does patient have access to weapons?: No Criminal Charges Pending?: No Does patient have a court date: No Is patient on probation?: No  Psychosis Hallucinations: Auditory, Visual, With command Delusions: None noted  Mental Status Report Appearance/Hygiene: In scrubs, Unremarkable Eye Contact: Fair Motor Activity: Freedom of movement Speech: Logical/coherent Level of Consciousness: Alert Mood: Depressed, Sad Affect: Appropriate to circumstance, Sad, Anxious Anxiety Level: Minimal Thought Processes: Coherent, Relevant Judgement: Partial Orientation: Person, Time, Place, Situation Obsessive Compulsive Thoughts/Behaviors: Minimal  Cognitive Functioning Concentration: Normal Memory: Recent Intact, Remote Intact IQ: Average Insight: Fair Impulse Control: Poor Appetite: Good Weight Loss: 0 Weight Gain: 0 Sleep: Increased Total Hours of Sleep: 12 Vegetative Symptoms: None  ADLScreening [facility] Assessment Services) Patient's cognitive  ability adequate to safely complete daily activities?: Yes Patient able to express need for assistance with ADLs?: Yes Independently performs ADLs?: Yes (appropriate for developmental age)  Prior Inpatient Therapy Prior Inpatient Therapy: Yes Prior Therapy Dates: 07/16, 06/16, 04/16, 04/15, 08/14, 1/17 Prior Therapy Facilty/Provider(s): [facility], [place], [place] Reason for Treatment: Schizophrenia  Prior Outpatient Therapy Prior Outpatient Therapy: Yes Prior Therapy Dates: Current' Prior Therapy Facilty/Provider(s): [facility] Reason for Treatment: Schizophrenia  Does patient have an ACCT team?: No Does patient have Intensive In-House Services?  : No Does patient have [facility] services? : No Does patient have P4CC services?: No  ADL Screening (condition at time of admission) Patient's cognitive ability adequate to safely complete daily activities?: Yes Patient able to express need for assistance with ADLs?: Yes Independently performs ADLs?: Yes (appropriate for developmental age)       Abuse/Neglect Assessment (Assessment to be complete while patient is alone) Physical Abuse: Denies Verbal Abuse: Denies Sexual Abuse: Denies Exploitation of patient/patient's resources: Denies Self-Neglect: Denies Values / Beliefs Cultural Requests During Hospitalization: None Spiritual Requests During Hospitalization: None Consults Spiritual Care Consult Needed: No Social Work Consult Needed: No Regulatory affairs officer (For Healthcare) Does patient have an advance directive?: No Would patient like information on creating an advanced directive?: Yes Higher education careers adviser given    Additional Information 1:1 In Past 12 Months?: No CIRT Risk: No Elopement Risk: No Does patient have medical clearance?: Yes     Disposition:  Disposition Initial Assessment Completed for this Encounter: Yes Disposition of Patient: Other dispositions Other disposition(s): Other (Comment)  (Psych MD consult) Patient referred to: Other (Comment)  On Site Evaluation by:  Reviewed with Physician:    Oneita Hurt 07/30/2015 10:02 PM

## 2015-07-30 NOTE — ED Notes (Signed)
VOL  

## 2015-07-31 ENCOUNTER — Emergency Department: Payer: Medicaid Other

## 2015-07-31 DIAGNOSIS — T189XXA Foreign body of alimentary tract, part unspecified, initial encounter: Secondary | ICD-10-CM | POA: Diagnosis present

## 2015-07-31 DIAGNOSIS — F250 Schizoaffective disorder, bipolar type: Secondary | ICD-10-CM

## 2015-07-31 IMAGING — CT CT ABD-PELV W/ CM
1 of 2 series · 14 of 32 positions shown, 18 images · IV contrast (omnipaque)
Comparison: Abdominal radiograph [DATE] and CT [DATE]

CLINICAL DATA: Abdominal pain and ingested multiple metallic
objects.

EXAM:
CT ABDOMEN AND PELVIS WITH CONTRAST
TECHNIQUE: Multidetector CT imaging of the abdomen and pelvis was performed
using the standard protocol following bolus administration of
intravenous contrast.
CONTRAST:  100mL OMNIPAQUE IOHEXOL 300 MG/ML  SOLN

[Series 2: routine abd pel with · axial · 0.75mm/px · z∈[-1126,-706]mm · 14 of 96 slices shown, 18 images]
[im 8/96  soft-tissue]
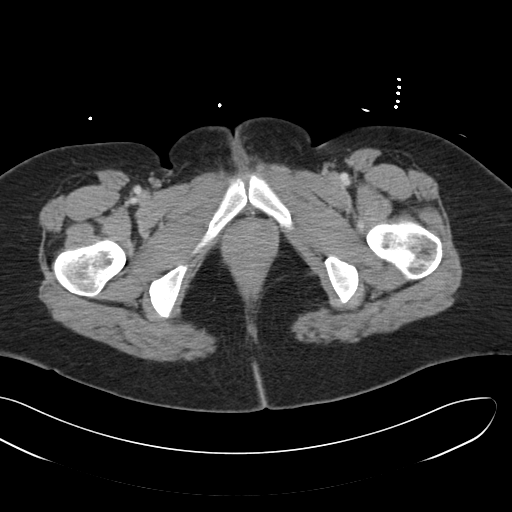
[im 8/96  bone]
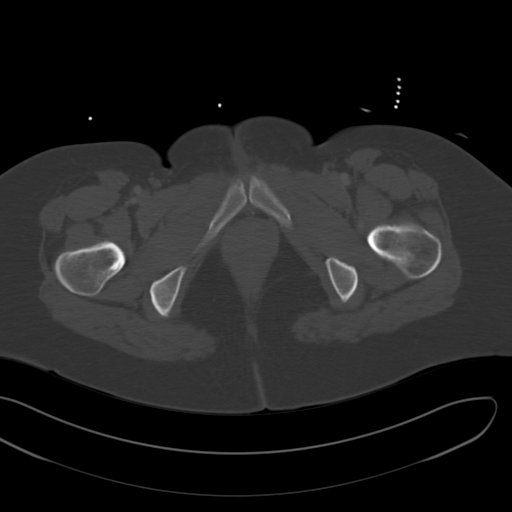
[im 16/96  soft-tissue]
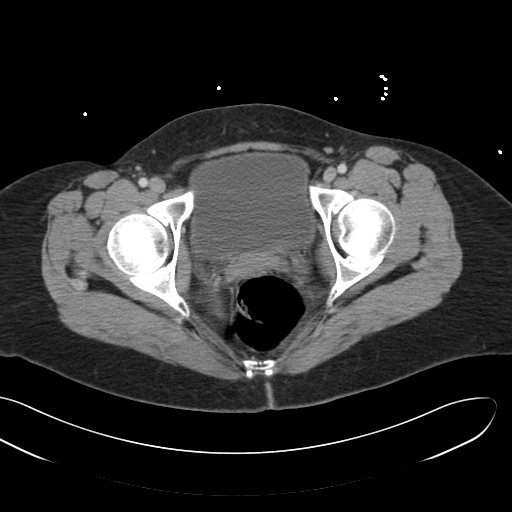
[im 23/96  soft-tissue]
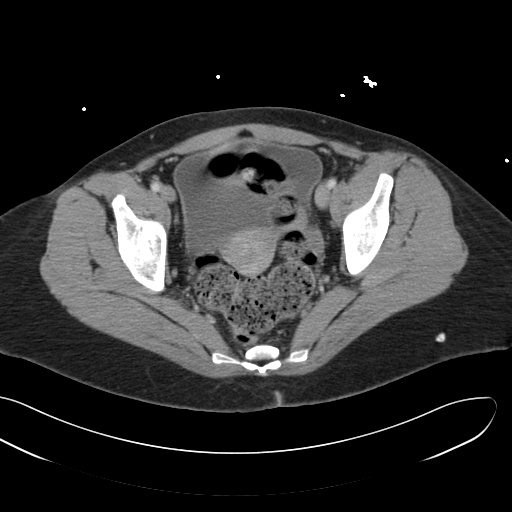
[im 31/96  soft-tissue]
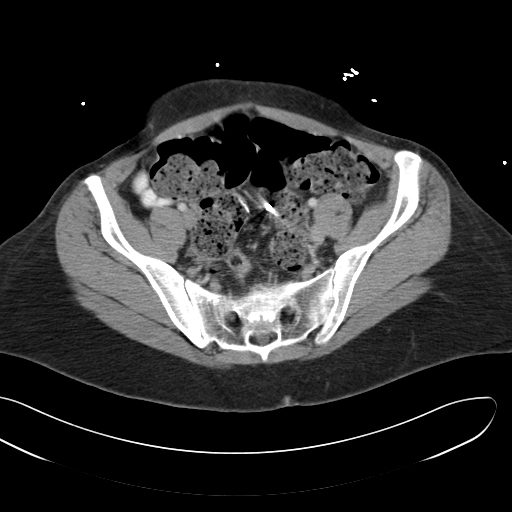
[im 39/96  soft-tissue]
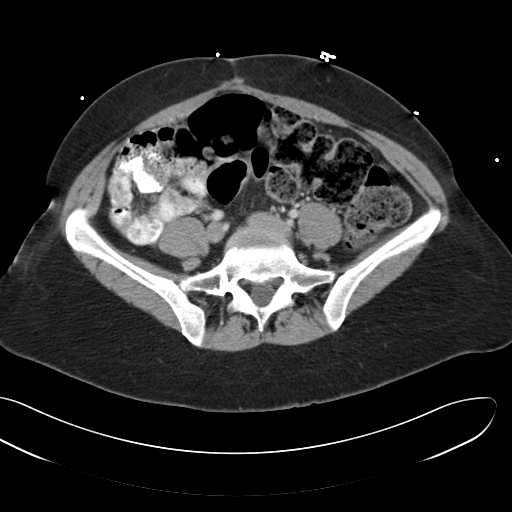
[im 46/96  soft-tissue]
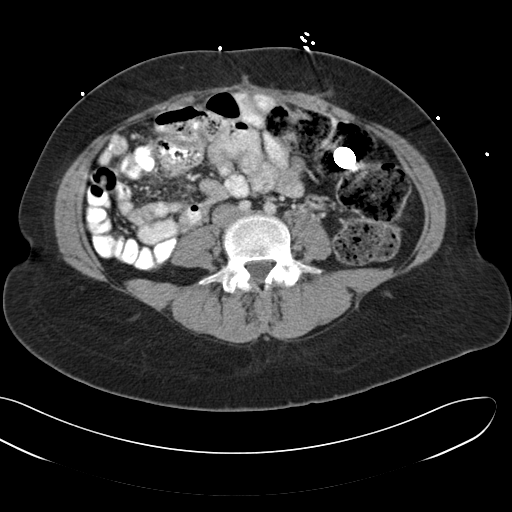
[im 54/96  soft-tissue]
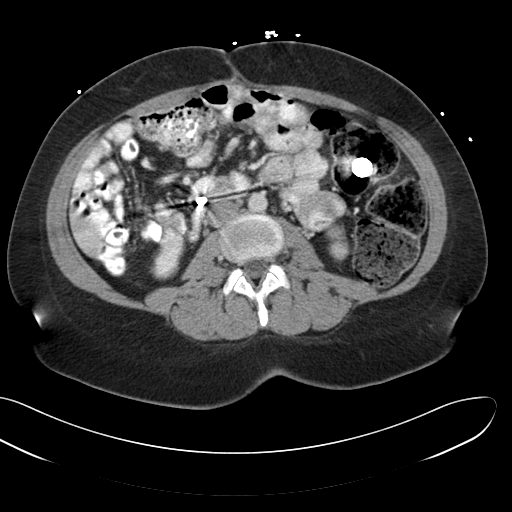
[im 61/96  soft-tissue]
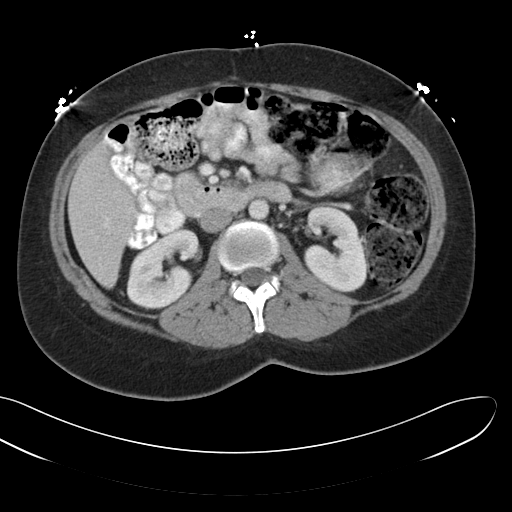
[im 69/96  soft-tissue]
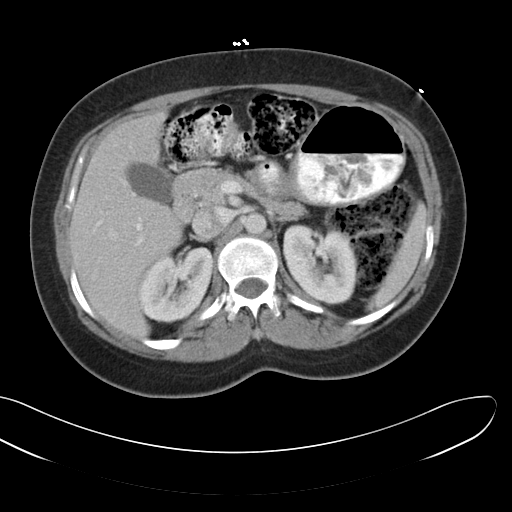
[im 69/96  bone]
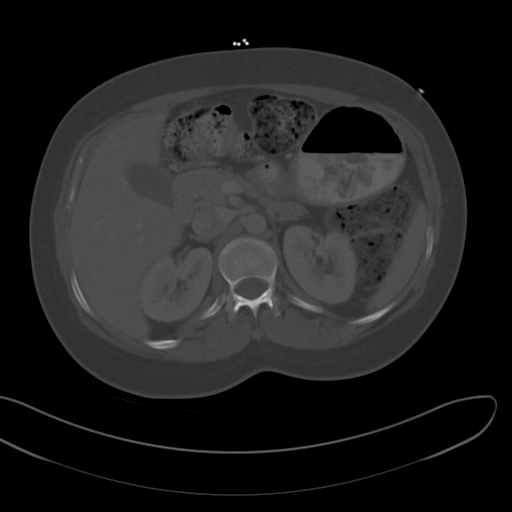
[im 77/96  soft-tissue]
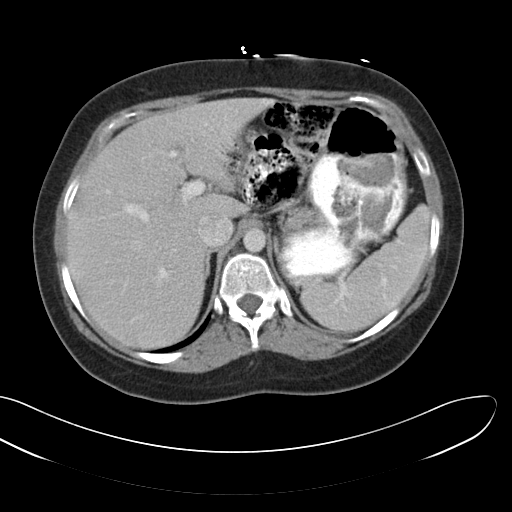
[im 80/96  lung]
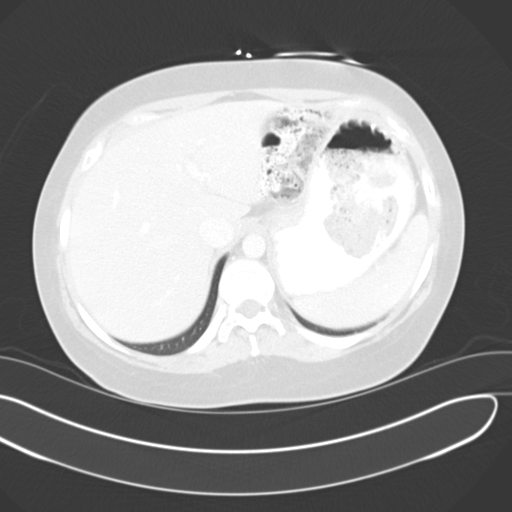
[im 84/96  soft-tissue]
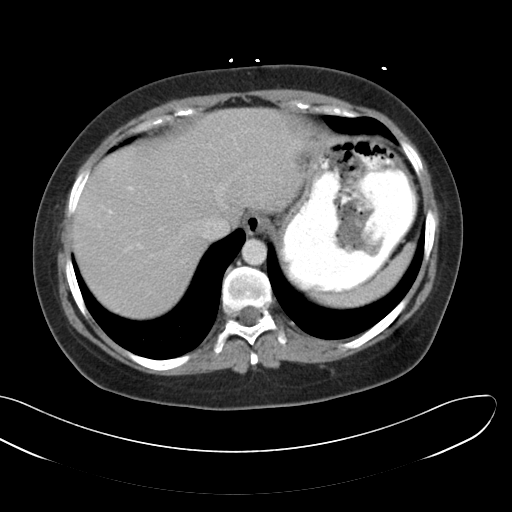
[im 84/96  lung]
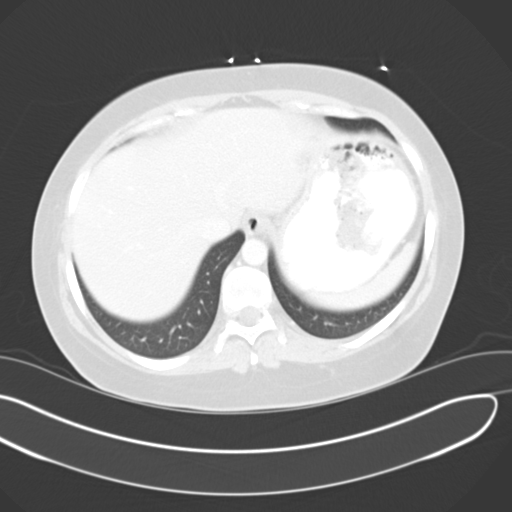
[im 88/96  lung]
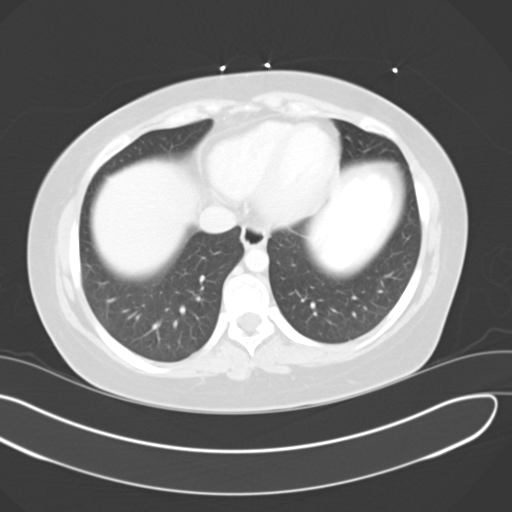
[im 92/96  soft-tissue]
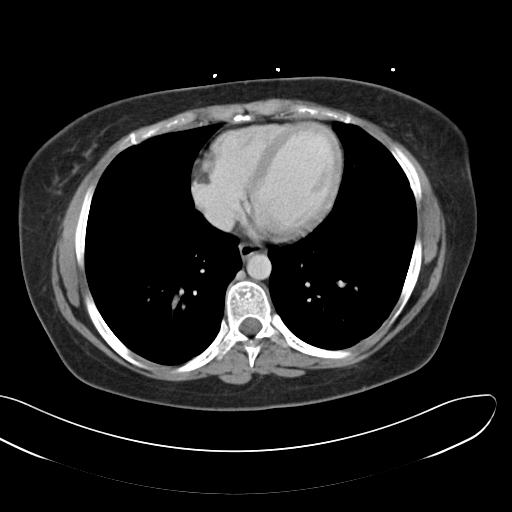
[im 92/96  lung]
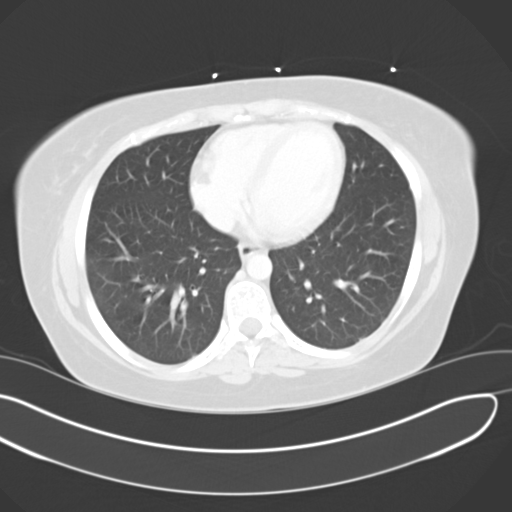

[14 of 32 positions shown; findings below may reference images not displayed]

FINDINGS: Lower chest:  Lung bases are clear.  No pleural effusions.

Hepatobiliary: Normal appearance of the liver and gallbladder.

Pancreas: Normal appearance of the pancreas without inflammation or
duct dilatation.

Spleen: Normal appearance of spleen without enlargement.

Adrenals/Urinary Tract: Normal adrenal glands. Normal appearance of
the kidneys without stones or hydronephrosis. Normal appearance the
urinary bladder.

Stomach/Bowel: Again noted are 3 metallic structures within the
bowel. These 3 metallic structures were present on the recent
abdominal radiograph from [DATE]. The cylinder shaped structure
is within the left transverse colon. This cylinder shaped structure
has moved towards the left compared to the recent comparison
examination. There is a triangular-shaped metallic structure in the
mid lower abdomen which has minimally changed in location. This
could be within colon but difficult to evaluate the exact location
due to the streak artifact. There is a metallic screw within the
small bowel in the right abdomen. This screw was on the left side of
the abdomen on [DATE]. Large amount of stool throughout the
colon. No evidence for a bowel obstruction. No significant bowel
wall thickening. There is oral contrast in small bowel and the right
colon.

Vascular/Lymphatic: No significant lymphadenopathy. Normal
appearance of the vasculature. Portal venous system is patent.

Reproductive: Normal appearance of the uterus and adnexal
structures.

Other: No evidence for free air or free fluid.

Musculoskeletal: Assuming that L5 is a transitional vertebral body,
there are bilateral pars defects at L4 without anterolisthesis.
IMPRESSION: There are three ingested foreign bodies within the bowel. Two of
these foreign bodies have changed location from the exam on
[DATE]. There is no evidence for a bowel obstruction or bowel
perforation. No significant bowel inflammation.

Large amount of stool in the colon.

## 2015-07-31 IMAGING — CR DG ABDOMEN 1V
2 series · 2 of 2 positions shown · non-contrast
Comparison: Abdominal radiographs [DATE]; CT abdomen and
pelvis obtained earlier today

CLINICAL DATA: Ingested foreign bodies

EXAM:
ABDOMEN - 1 VIEW

[abdomen kub (1 of 2)]
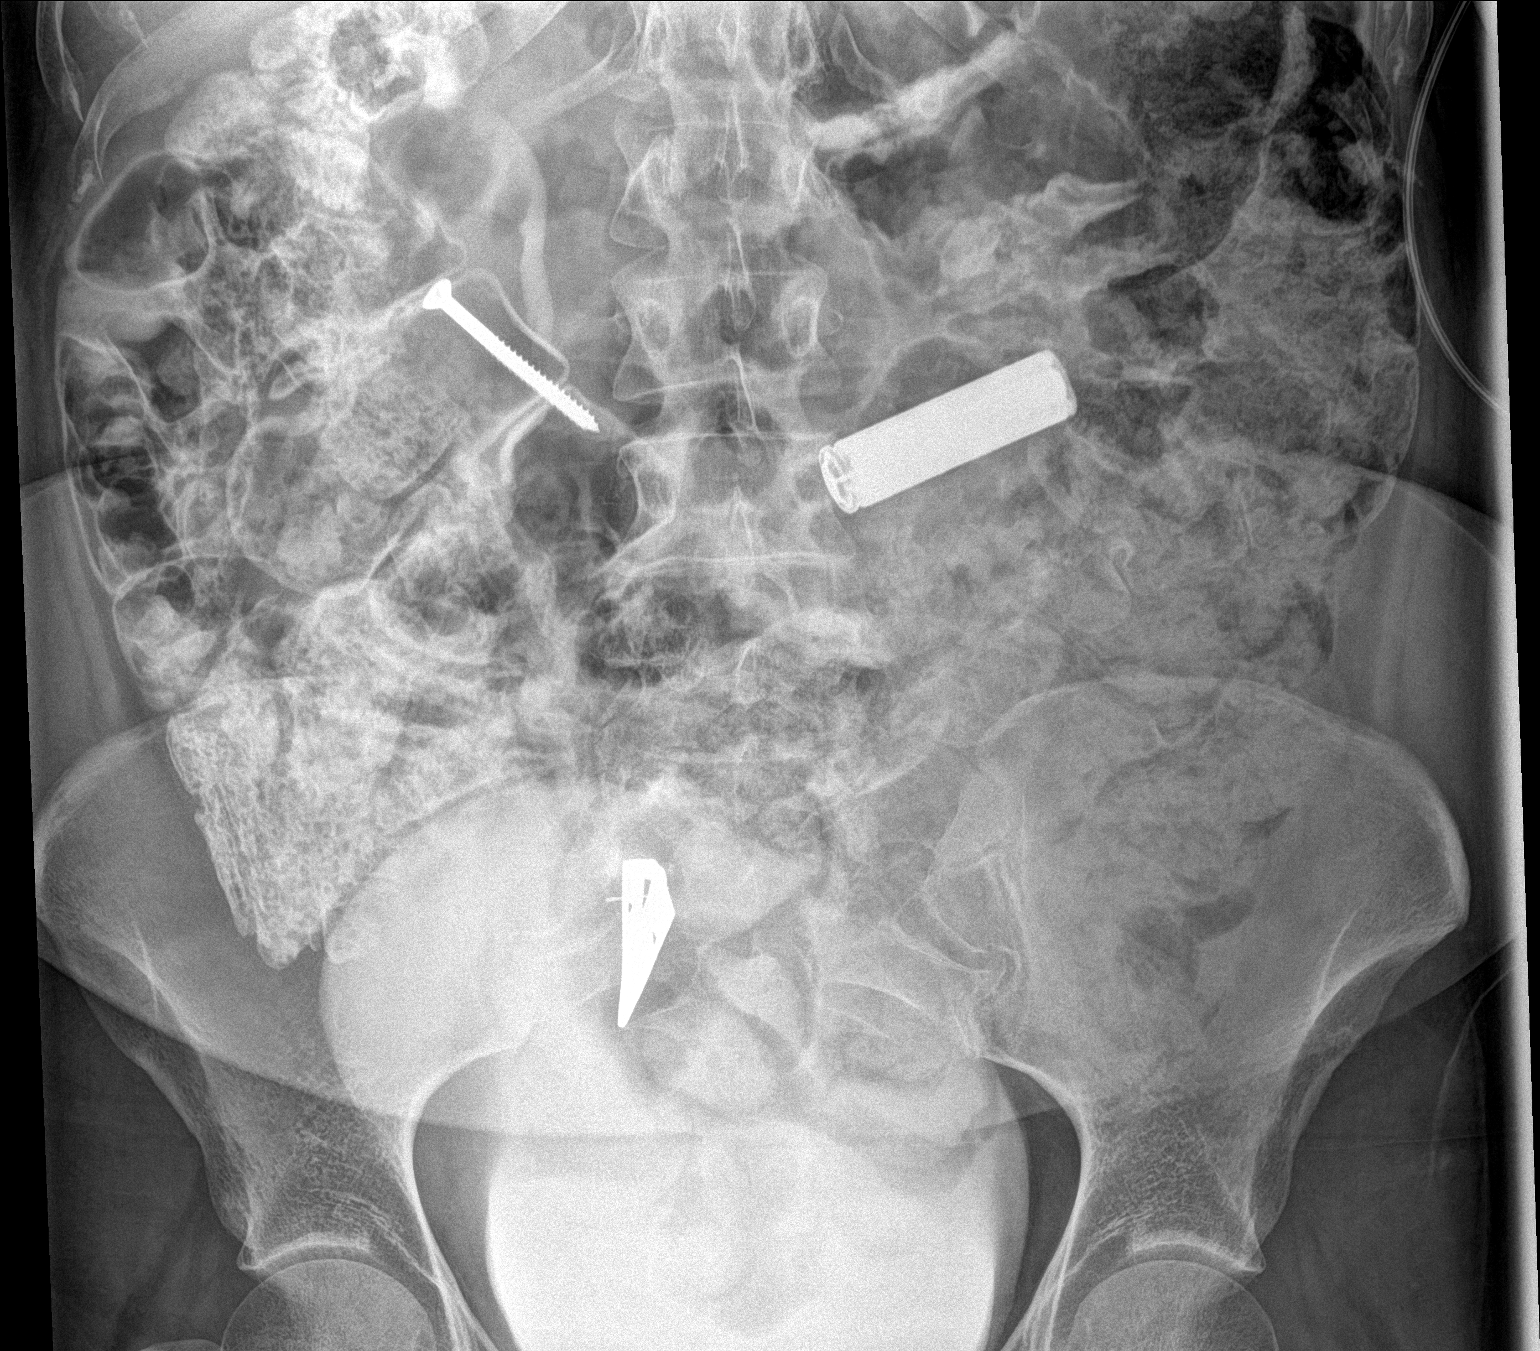

[abdomen kub (2 of 2)]
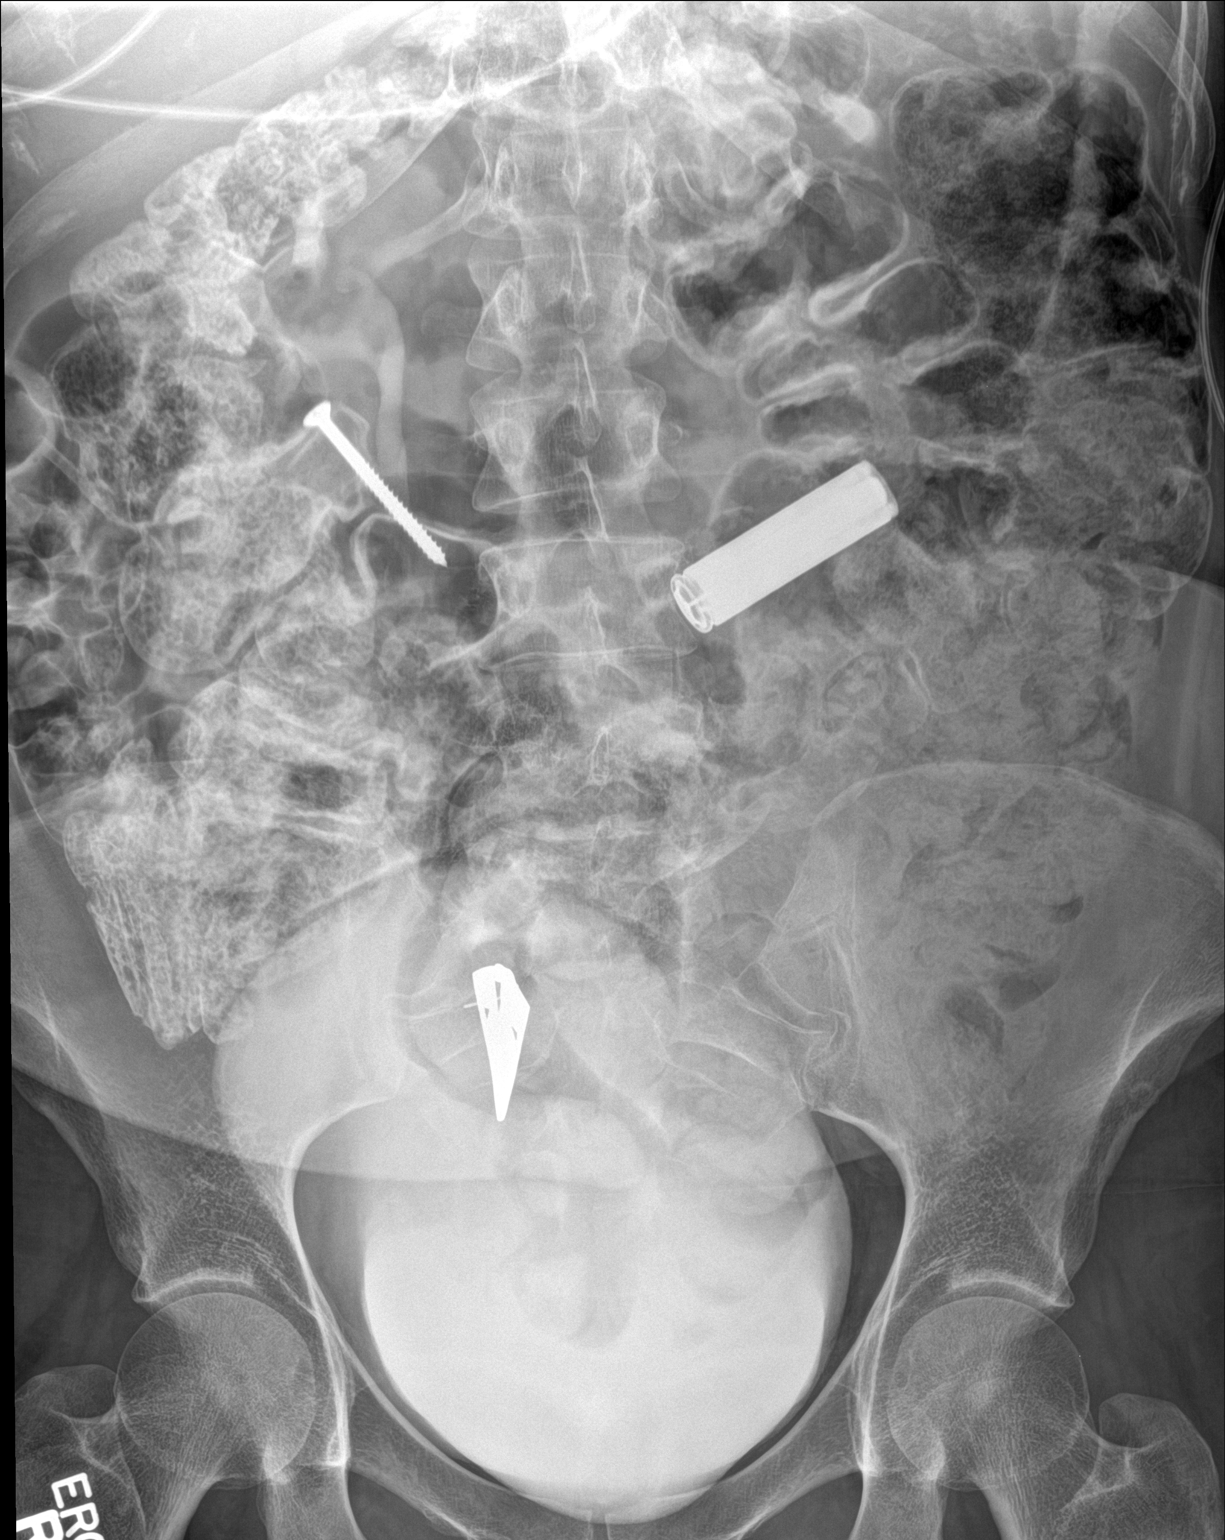

[2 of 2 positions shown; findings below may reference images not displayed]

FINDINGS: There are 3 metallic foreign bodies. A a metallic screw is noted in
the right mid abdomen. A cylindrical metallic foreign body is
located in the left mid abdomen. A metallic triangular-shaped
structure is located in the right lower quadrant. These devices have
moved only slightly since earlier today.

No free air is evident on this supine examination. No bowel
obstruction. Diffuse stool throughout colon. Urinary bladder is
distended with contrast.
IMPRESSION: There are 3 metallic foreign bodies in the lower abdomen and pelvis,
little changed from earlier in the day. The cylindrical metallic
foreign body is in the mid transverse colon. The screw is in the mid
ileum region. The triangular-shaped metallic foreign body is
probably within the sigmoid colon; its location is more difficult to
assess precisely. No demonstrable bowel obstruction or free air is
evident on this supine examination. Diffuse stool is noted
throughout the colon.

## 2015-07-31 IMAGING — DX DG ABDOMEN 1V
2 series · 2 of 2 positions shown · non-contrast
Comparison: Abdominal radiograph performed earlier today at [DATE]
p.m.

CLINICAL DATA: Follow-up location of foreign bodies within the
abdomen. Subsequent encounter.

EXAM:
ABDOMEN - 1 VIEW

[abdomen kub (1 of 2)]
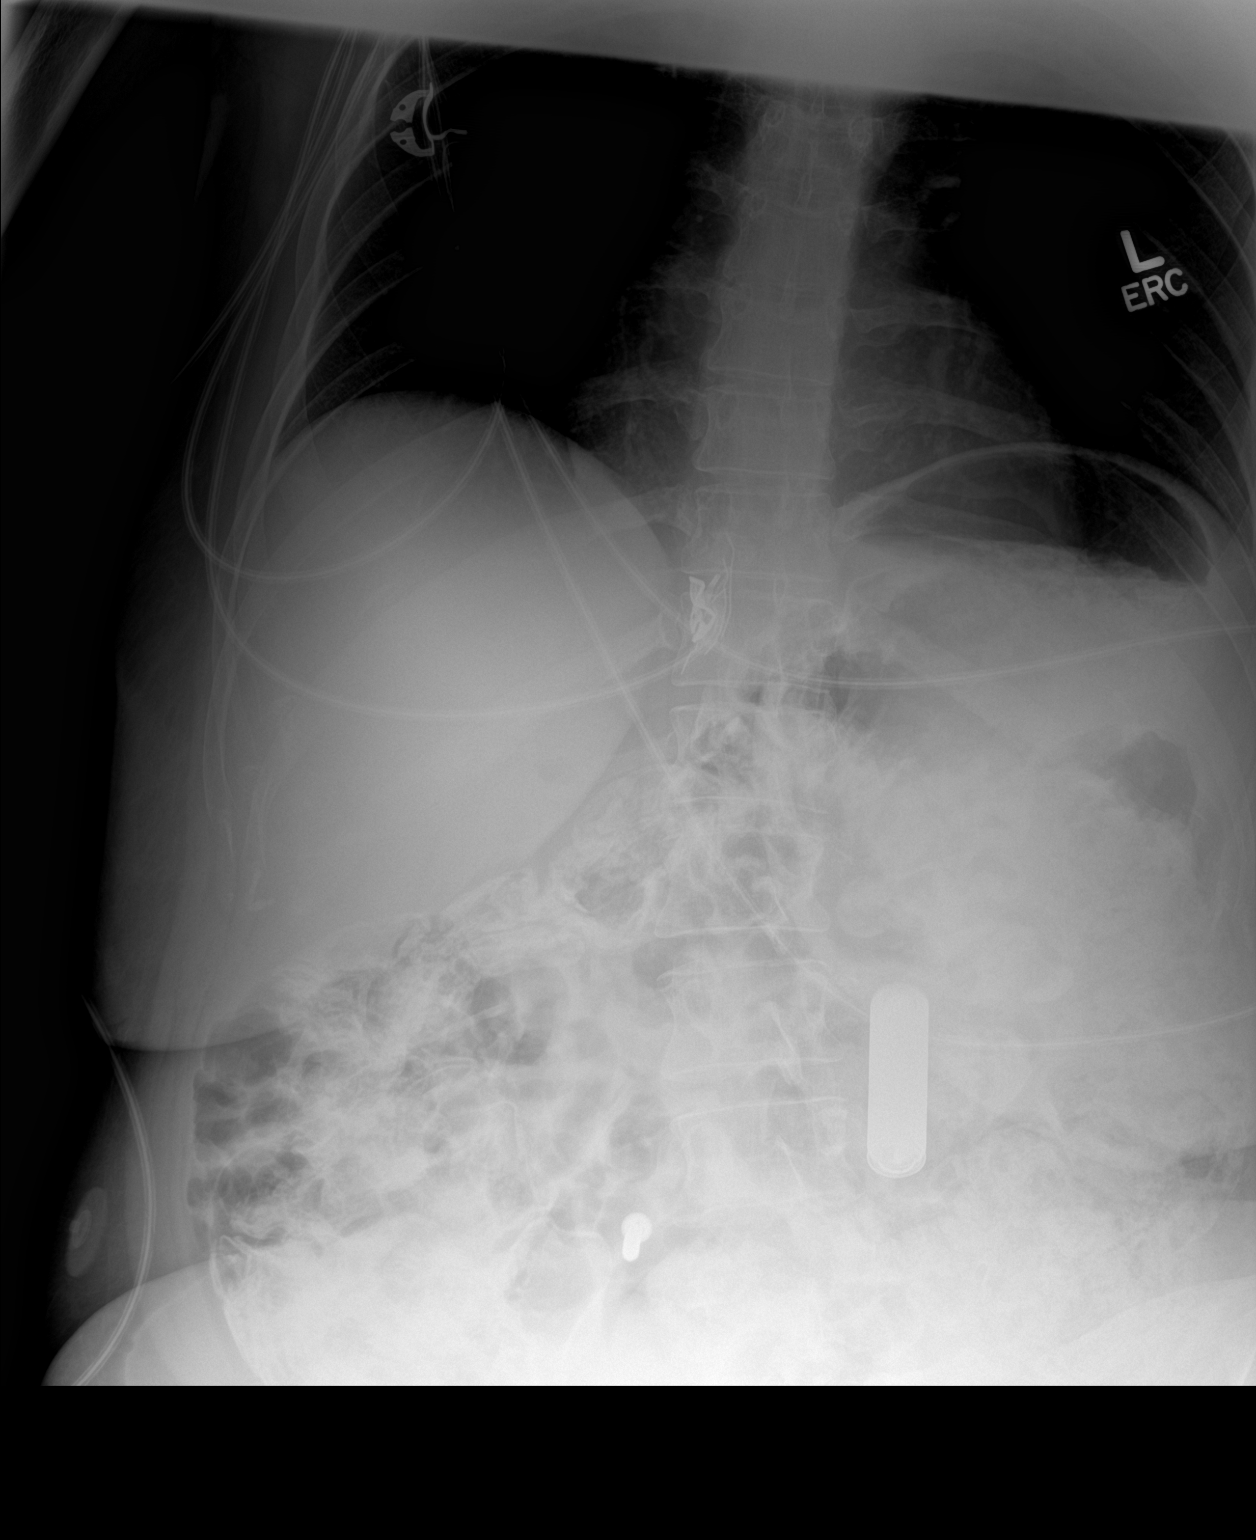

[abdomen kub (2 of 2)]
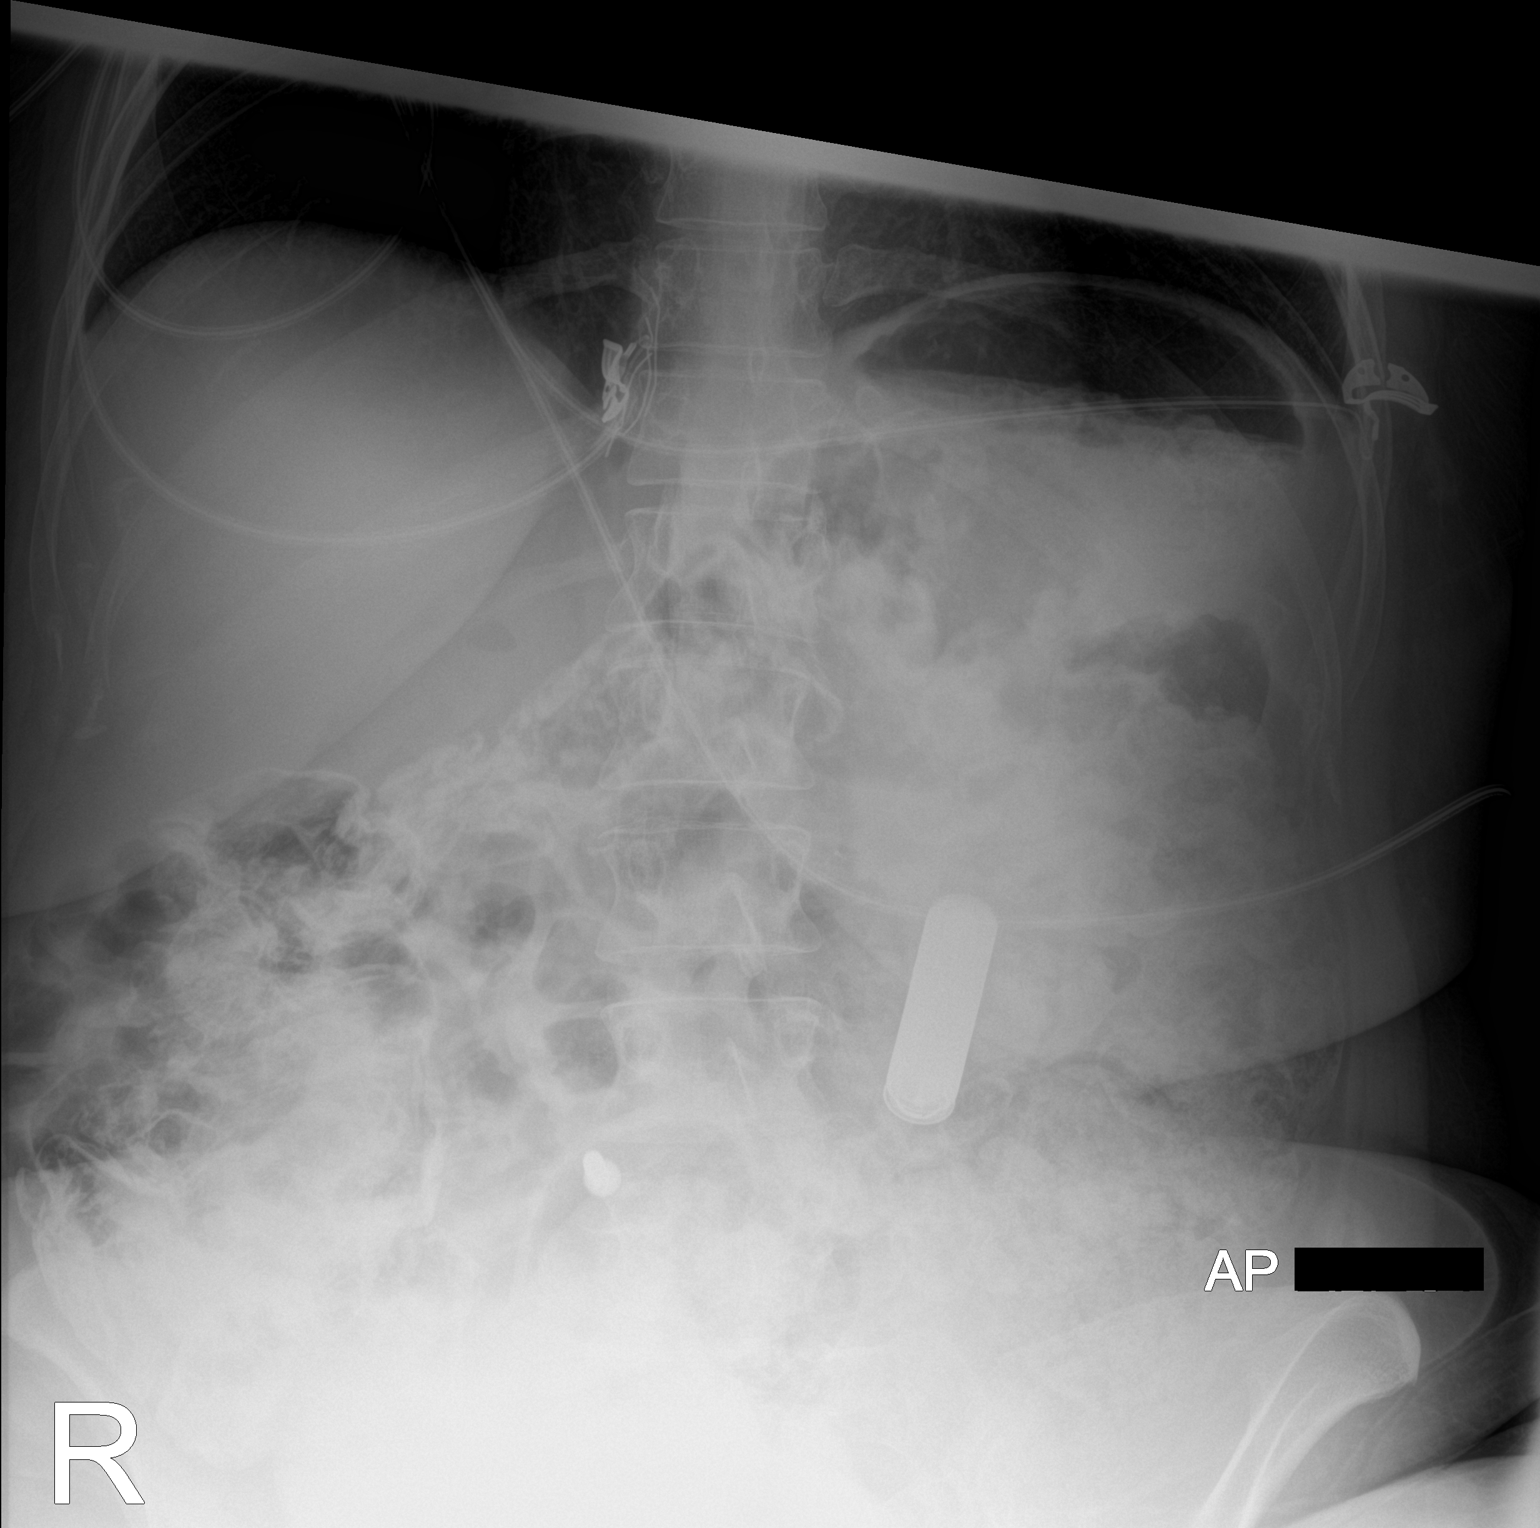

[2 of 2 positions shown; findings below may reference images not displayed]

FINDINGS: Two radiopaque foreign bodies are seen, though the pelvis is not
imaged on this study. The tubular metal structure is noted overlying
the left mid abdomen, while the nail is noted overlying the right
lower abdomen. Their locations are not well assessed on radiograph.
The triangular metal structure, if still present, is likely outside
of the field of view.

No definite free intra-abdominal air is seen on the provided upright
view. The visualized lung bases are grossly clear. No acute osseous
abnormalities are identified.
IMPRESSION: Two radiopaque foreign bodies noted. The pelvis is not imaged on
this study. The positions of these foreign bodies not well assessed
on radiograph, though they have likely progressed mildly since the
prior study. The triangular metal structure is likely outside of the
field of view. No free intra-abdominal air seen.

## 2015-07-31 MED ORDER — MORPHINE SULFATE (PF) 4 MG/ML IV SOLN
4.0000 mg | Freq: Once | INTRAVENOUS | Status: AC
  Administered 2015-07-31: 4 mg via INTRAVENOUS
  Filled 2015-07-31: qty 1

## 2015-07-31 MED ORDER — VENLAFAXINE HCL ER 75 MG PO CP24
150.0000 mg | ORAL_CAPSULE | Freq: Every day | ORAL | Status: DC
  Administered 2015-08-01 – 2015-08-05 (×5): 150 mg via ORAL
  Filled 2015-07-31 (×3): qty 2
  Filled 2015-07-31: qty 1
  Filled 2015-07-31: qty 2
  Filled 2015-07-31: qty 1
  Filled 2015-07-31: qty 2

## 2015-07-31 MED ORDER — BENZTROPINE MESYLATE 1 MG PO TABS
1.0000 mg | ORAL_TABLET | Freq: Every day | ORAL | Status: DC
  Administered 2015-07-31 – 2015-08-04 (×5): 1 mg via ORAL
  Filled 2015-07-31: qty 1
  Filled 2015-07-31: qty 2
  Filled 2015-07-31 (×4): qty 1

## 2015-07-31 MED ORDER — AMLODIPINE BESYLATE 5 MG PO TABS
2.5000 mg | ORAL_TABLET | Freq: Every day | ORAL | Status: DC
  Administered 2015-07-31 – 2015-08-05 (×6): 2.5 mg via ORAL
  Filled 2015-07-31 (×6): qty 1

## 2015-07-31 MED ORDER — OXCARBAZEPINE 300 MG PO TABS
150.0000 mg | ORAL_TABLET | Freq: Two times a day (BID) | ORAL | Status: DC
  Administered 2015-07-31 – 2015-08-05 (×10): 150 mg via ORAL
  Filled 2015-07-31 (×2): qty 1
  Filled 2015-07-31: qty 0.5
  Filled 2015-07-31 (×3): qty 1
  Filled 2015-07-31: qty 0.5
  Filled 2015-07-31 (×4): qty 1
  Filled 2015-07-31 (×2): qty 0.5
  Filled 2015-07-31: qty 1

## 2015-07-31 MED ORDER — POLYETHYLENE GLYCOL 3350 17 G PO PACK
17.0000 g | PACK | Freq: Every day | ORAL | Status: DC
  Administered 2015-07-31 – 2015-08-05 (×6): 17 g via ORAL
  Filled 2015-07-31 (×6): qty 1

## 2015-07-31 MED ORDER — DIPHENHYDRAMINE HCL 50 MG/ML IJ SOLN
50.0000 mg | Freq: Once | INTRAMUSCULAR | Status: AC
  Administered 2015-07-31: 50 mg via INTRAVENOUS
  Filled 2015-07-31: qty 1

## 2015-07-31 MED ORDER — TRAZODONE HCL 100 MG PO TABS
100.0000 mg | ORAL_TABLET | Freq: Every day | ORAL | Status: DC
  Administered 2015-07-31 – 2015-08-04 (×5): 100 mg via ORAL
  Filled 2015-07-31 (×5): qty 1

## 2015-07-31 MED ORDER — HYDROCORTISONE NA SUCCINATE PF 250 MG IJ SOLR
200.0000 mg | Freq: Once | INTRAMUSCULAR | Status: AC
  Administered 2015-07-31: 200 mg via INTRAVENOUS
  Filled 2015-07-31: qty 200

## 2015-07-31 MED ORDER — IOHEXOL 300 MG/ML  SOLN
100.0000 mL | Freq: Once | INTRAMUSCULAR | Status: AC | PRN
  Administered 2015-07-31: 100 mL via INTRAVENOUS

## 2015-07-31 MED ORDER — LURASIDONE HCL 80 MG PO TABS
120.0000 mg | ORAL_TABLET | Freq: Every day | ORAL | Status: DC
  Administered 2015-08-01 – 2015-08-02 (×2): 120 mg via ORAL
  Filled 2015-07-31 (×4): qty 1
  Filled 2015-07-31: qty 3
  Filled 2015-07-31: qty 1

## 2015-07-31 MED ORDER — DOCUSATE SODIUM 100 MG PO CAPS
200.0000 mg | ORAL_CAPSULE | Freq: Two times a day (BID) | ORAL | Status: DC
  Administered 2015-07-31 – 2015-08-04 (×10): 200 mg via ORAL
  Filled 2015-07-31 (×11): qty 2

## 2015-07-31 MED ORDER — FLUPHENAZINE HCL 5 MG PO TABS
5.0000 mg | ORAL_TABLET | Freq: Three times a day (TID) | ORAL | Status: DC
  Administered 2015-07-31 – 2015-08-05 (×15): 5 mg via ORAL
  Filled 2015-07-31 (×17): qty 1

## 2015-07-31 MED ORDER — LEVOTHYROXINE SODIUM 75 MCG PO TABS
75.0000 ug | ORAL_TABLET | Freq: Every day | ORAL | Status: DC
  Administered 2015-08-01 – 2015-08-05 (×5): 75 ug via ORAL
  Filled 2015-07-31 (×8): qty 1

## 2015-07-31 NOTE — ED Notes (Signed)
Patient continues to rest in room with NAD noted. Patient with no behaviors this shift. No anticipated needs. Will continue to monitor.

## 2015-07-31 NOTE — ED Notes (Signed)
BEHAVIORAL HEALTH ROUNDING Patient sleeping: YES Patient alert and oriented: Sleeping Behavior appropriate: Sleeping Describe behavior: No inappropriate or unacceptable behaviors noted at this time.  Nutrition and fluids offered: Sleeping Toileting and hygiene offered: Sleeping Sitter present: Behavioral tech rounding every 15 minutes on patient to ensure safety.  Law enforcement present: YES Law enforcement agency: [employer] ([employer] 

## 2015-07-31 NOTE — ED Notes (Signed)
Pt resting in bed in no acute distress 

## 2015-07-31 NOTE — ED Provider Notes (Addendum)
-----------------------------------------   4:55 PM on 07/31/2015 -----------------------------------------   Blood pressure 141/92, pulse 65, temperature 97.7 F (36.5 C), temperature source Oral, resp. rate 19, height 5\' 6"  (1.676 m), weight 194 lb (87.998 kg), SpO2 100 %.  Updated the patient regarding the CAT scan results.  At this point the patient has received 2 doses of morphine for abdominal pain. No evidence of perforation or obstruction at this time on CAT scan. The patient is eating. I reexamined her abdomen which is soft and nontender. I also discussed the case with the surgeon on-call this time,Dr. Adonis Huguenin, who recommends continued watchful waiting. At this time, the patient is not complaining of any pain.    Orbie Pyo, MD 07/31/15 1657  ----------------------------------------- 8:47 PM on 07/31/2015 -----------------------------------------  Patient again complaining of pain which says is now 9 out of 10 in her abdomen. I reexamined her abdomen and it is soft but diffusely tender. Discussed the case with Dr.Gonczy of surgery. We will be obtaining an upright abdominal film to make sure there is no free air. I will reassess the patient after she has had pain medication. He does not recommend taking her to the operating room at this time unless there is free air.  Orbie Pyo, MD 07/31/15 2047 ----------------------------------------- 11:51 PM on 07/31/2015 -----------------------------------------   Patient still complaining of a 5 out of 10 abdominal pain. Abdomen reexamined no change in the exam. Belly is soft with mild diffuse tenderness palpation. Also repeat x-ray without any free air. Patient still remains a nonsurgical case at this time. We'll admit to medicine for ongoing abdominal pain. Signed out to Dr.Oseni.   Orbie Pyo, MD 07/31/15 [phone]

## 2015-07-31 NOTE — ED Notes (Signed)
MD Schaevitz at bedside. 

## 2015-07-31 NOTE — ED Notes (Signed)
Spoke with radiology, per radiologist pt to have x-ray scheduled for 1930 today per Dr. Owens Shark request.

## 2015-07-31 NOTE — Consult Note (Signed)
[place] [facility] Consult   Reason for Consult:  Consult for this 25 year old woman with schizoaffective disorder history of depression and history of multiple foreign body ingestions comes back to the emergency room after swallowing more foreign objects and also having suicidal thoughts and hallucinations Referring Physician:  Jimmye Norman Patient Identification: Melanie Bell MRN:  [id number] Principal Diagnosis: Schizoaffective disorder, bipolar type [facility]) Diagnosis:   Patient Active Problem List   Diagnosis Date Noted  . Suicidal ideation [R45.851] 06/15/2015  . 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 Schizoaffective disorder, bipolar type ([place]) [F25.0] 11/06/2014  . Tardive dyskinesia [G24.01] 10/06/2014  . Tobacco use disorder [F17.200] 09/30/2014  . Hypothyroidism [E03.9] 09/29/2014  . Hypertension [I10] 09/29/2014  . Constipation [K59.00] 09/29/2014    Total Time spent with patient: 1 hour  Subjective:   Melanie Bell is a 25 y.o. female patient admitted with "I'm really depressed".  HPI:  Patient interviewed. Chart reviewed current labs reviewed including the x-ray. Case discussed with TTS and emergency room doctor. 25 year old woman with a history of schizoaffective disorder well known to [name] comes into the hospital after swallowing some more inanimate objects. She says that she swallowed a AA battery about a week ago and swallowed a screw the day before yesterday. She told the staff about it yesterday. They brought her to the emergency room. She says she was doing okay until the day before yesterday when suddenly her auditory hallucinations came back full force. She is still hearing voices today. The voices tell her to kill herself. She has active suicidal thoughts. Her mood is very sad. Additionally she is complaining of abdominal pain and says she hasn't had a bowel movement in about a week. Not abusing substances. Acute stress that she can identify is  that she spoke to her mother the day before yesterday and the mother reminded her that it was the anniversary of the death of a family friend a few years ago.  Social history: Patient lives in a group home. I believe mother is guardian but mother now lives not only out of state that in [place]. Ever since this happened the patient's been decompensating. Patient lives in this group home and frequently is acting out by swallowing objects.  Medical history: Long history of swallowing inanimate objects. She has needed surgery in the past at times for it. Currently she has 3 objects in her elementary canal, the battery the screw and an earring that still has not passed through. Also has a history of hypothyroidism and high blood pressure.  Substance abuse history: Does not drink does not abuse drugs.  Past Psychiatric History: Long history of depression with psychotic symptoms probably schizoaffective versus recurrent bipolar depression. Hasn't act team and is on antipsychotics and mood stabilizers with only partial benefit. As mentioned above her typical presentation is too of swallowed some kind of inanimate object often things that would be potentially harmful. She has needed multiple hospitalizations over the years. She seems to of been decompensating and has had more hospitalizations and presentations to the ER in the last few months than normal.  Risk to Self: Suicidal Ideation: No Suicidal Intent: No Is patient at risk for suicide?: No Suicidal Plan?: No Access to Means: Yes Specify Access to Suicidal Means: Pt has access to objects in the group home What has been your use of drugs/alcohol within the last 12 months?: None reported How many times?: 11 Other Self Bell Risks: History of cutting, swallowing objects Triggers for Past  Attempts: Hallucinations, Family contact, Other personal contacts Intentional Self Injurious Behavior: Cutting Comment - Self Injurious Behavior: Pt has history of  cutting but hasn't done so in the past 5 years. Risk to Others: Homicidal Ideation: No Thoughts of Bell to Others: No Current Homicidal Intent: No Current Homicidal Plan: No Access to Homicidal Means: No Identified Victim: None reported History of Bell to others?: No Assessment of Violence: None Noted Violent Behavior Description: None reported Does patient have access to weapons?: No Criminal Charges Pending?: No Does patient have a court date: No Prior Inpatient Therapy: Prior Inpatient Therapy: Yes Prior Therapy Dates: 07/16, 06/16, 04/16, 04/15, 08/14, 1/17 Prior Therapy Facilty/Provider(s): [facility], [place], [place] Reason for Treatment: Schizophrenia Prior Outpatient Therapy: Prior Outpatient Therapy: Yes Prior Therapy Dates: Current' Prior Therapy Facilty/Provider(s): [facility] Reason for Treatment: Schizophrenia  Does patient have an ACCT team?: No Does patient have Intensive In-House Services?  : No Does patient have [facility] services? : No Does patient have P4CC services?: No  Past Medical History:  Past Medical History  Diagnosis Date  . Hallucinations 09/30/2014    Sizoaffective  . Depression   . Anxiety   . Hypertension   . Tardive dyskinesia 10/2014    recent onset  . GERD (gastroesophageal reflux disease)   . Hyperlipidemia     Past Surgical History  Procedure Laterality Date  . Wisdom tooth extraction    . Esophagogastroduodenoscopy N/A 11/28/2014    Procedure: ESOPHAGOGASTRODUODENOSCOPY (EGD);  Surgeon: [name], MD;  [facility] END[facility]COPY;  Service: Endoscopy;  Laterality: N/A;  . Abdominal surgery      "years ago" to remove foreign objects  . Breast lumpectomy     Family History:  Family History  Problem Relation Age of Onset  . Depression Mother   . Hypertension Mother    Family Psychiatric  History: Positive family history for depression possible schizoaffective disorder. Social History:  History  Alcohol Use No      History  Drug Use No    Social History   Social History  . Marital Status: Single    Spouse Name: N/A  . Number of Children: N/A  . Years of Education: N/A   Social History Main Topics  . Smoking status: Current Every Day Smoker -- 0.50 packs/day for 3 years    Types: Cigarettes  . Smokeless tobacco: Never Used  . Alcohol Use: No  . Drug Use: No  . Sexual Activity: No   Other Topics Concern  . None   Social History Narrative   Additional Social History:    Allergies:   Allergies  Allergen Reactions  . Betadine [Povidone Iodine] Other (See Comments)    Reaction:  Unknown   . Shellfish-Derived Products Other (See Comments)    Reaction:  Unknown   . Iodine Rash    Labs:  Results for orders placed or performed during the hospital encounter of 07/30/15 (from the past 48 hour(s))  Comprehensive metabolic panel     Status: Abnormal   Collection Time: 07/30/15  6:38 PM  Result Value Ref Range   Sodium 140 135 - 145 mmol/L   Potassium 4.2 3.5 - 5.1 mmol/L   Chloride 111 101 - 111 mmol/L   CO2 24 22 - 32 mmol/L   Glucose, Bld 64 (L) 65 - 99 mg/dL   BUN 8 6 - 20 mg/dL   Creatinine, Ser 0.81 0.44 - 1.00 mg/dL   Calcium 9.5 8.9 - 10.3 mg/dL   Total  Protein 7.7 6.5 - 8.1 g/dL   Albumin 4.4 3.5 - 5.0 g/dL   AST 17 15 - 41 U/L   ALT 8 (L) 14 - 54 U/L   Alkaline Phosphatase 90 38 - 126 U/L   Total Bilirubin 0.3 0.3 - 1.2 mg/dL   GFR calc non Af Amer >60 >60 mL/min   GFR calc Af Amer >60 >60 mL/min    Comment: (NOTE) The eGFR has been calculated using the CKD EPI equation. This calculation has not been validated in all clinical situations. eGFR's persistently <60 mL/min signify possible Chronic Kidney Disease.    Anion gap 5 5 - 15  Ethanol (ETOH)     Status: None   Collection Time: 07/30/15  6:38 PM  Result Value Ref Range   Alcohol, Ethyl (B) <5 <5 mg/dL    Comment:        LOWEST DETECTABLE LIMIT FOR SERUM ALCOHOL IS 5 mg/dL FOR MEDICAL PURPOSES ONLY    Salicylate level     Status: None   Collection Time: 07/30/15  6:38 PM  Result Value Ref Range   Salicylate Lvl <1.6 2.8 - 30.0 mg/dL  Acetaminophen level     Status: Abnormal   Collection Time: 07/30/15  6:38 PM  Result Value Ref Range   Acetaminophen (Tylenol), Serum <10 (L) 10 - 30 ug/mL    Comment:        THERAPEUTIC CONCENTRATIONS VARY SIGNIFICANTLY. A RANGE OF 10-30 ug/mL MAY BE AN EFFECTIVE CONCENTRATION FOR MANY PATIENTS. HOWEVER, SOME ARE BEST TREATED AT CONCENTRATIONS OUTSIDE THIS RANGE. ACETAMINOPHEN CONCENTRATIONS >150 ug/mL AT 4 HOURS AFTER INGESTION AND >50 ug/mL AT 12 HOURS AFTER INGESTION ARE OFTEN ASSOCIATED WITH TOXIC REACTIONS.   CBC     Status: None   Collection Time: 07/30/15  6:38 PM  Result Value Ref Range   WBC 7.8 3.6 - 11.0 K/uL   RBC 4.25 3.80 - 5.20 MIL/uL   Hemoglobin 13.1 12.0 - 16.0 g/dL   HCT 39.0 35.0 - 47.0 %   MCV 91.9 80.0 - 100.0 fL   MCH 30.8 26.0 - 34.0 pg   MCHC 33.5 32.0 - 36.0 g/dL   RDW 13.5 11.5 - 14.5 %   Platelets 209 150 - 440 K/uL  Urine Drug Screen, Qualitative ([facility] only)     Status: None   Collection Time: 07/30/15  6:38 PM  Result Value Ref Range   Tricyclic, Ur Screen NONE DETECTED NONE DETECTED   Amphetamines, Ur Screen NONE DETECTED NONE DETECTED   MDMA (Ecstasy)Ur Screen NONE DETECTED NONE DETECTED   Cocaine Metabolite,Ur [place] NONE DETECTED NONE DETECTED   Opiate, Ur Screen NONE DETECTED NONE DETECTED   Phencyclidine (PCP) Ur S NONE DETECTED NONE DETECTED   Cannabinoid 50 Ng, Ur [place] NONE DETECTED NONE DETECTED   Barbiturates, Ur Screen NONE DETECTED NONE DETECTED   Benzodiazepine, Ur Scrn NONE DETECTED NONE DETECTED   Methadone Scn, Ur NONE DETECTED NONE DETECTED    Comment: (NOTE) 579  Tricyclics, urine               Cutoff 1000 ng/mL 200  Amphetamines, urine             Cutoff 1000 ng/mL 300  MDMA (Ecstasy), urine           Cutoff 500 ng/mL 400  Cocaine Metabolite, urine       Cutoff 300 ng/mL 500  Opiate,  urine  Cutoff 300 ng/mL 600  Phencyclidine (PCP), urine      Cutoff 25 ng/mL 700  Cannabinoid, urine              Cutoff 50 ng/mL 800  Barbiturates, urine             Cutoff 200 ng/mL 900  Benzodiazepine, urine           Cutoff 200 ng/mL 1000 Methadone, urine                Cutoff 300 ng/mL 1100 1200 The urine drug screen provides only a preliminary, unconfirmed 1300 analytical test result and should not be used for non-medical 1400 purposes. Clinical consideration and professional judgment should 1500 be applied to any positive drug screen result due to possible 1600 interfering substances. A more specific alternate chemical method 1700 must be used in order to obtain a confirmed analytical result.  1800 Gas chromato graphy / mass spectrometry (GC/MS) is the preferred 1900 confirmatory method.   Pregnancy, urine POC     Status: None   Collection Time: 07/30/15  6:44 PM  Result Value Ref Range   Preg Test, Ur NEGATIVE NEGATIVE    Comment:        THE SENSITIVITY OF THIS METHODOLOGY IS >24 mIU/mL     Current Facility-Administered Medications  Medication Dose Route Frequency Provider Last Rate Last Dose  . amLODipine (NORVASC) tablet 2.5 mg  2.5 mg Oral Daily [name], MD      . benztropine (COGENTIN) tablet 1 mg  1 mg Oral QHS [name] [name], MD      . docusate sodium (COLACE) capsule 200 mg  200 mg Oral BID [name], MD      . fluPHENAZine (PROLIXIN) tablet 5 mg  5 mg Oral 3 times per day [name], MD      . Derrill Memo ON 08/01/2015] levothyroxine (SYNTHROID, LEVOTHROID) tablet 75 mcg  75 mcg Oral QAC breakfast [name], MD      . Derrill Memo ON 08/01/2015] lurasidone (LATUDA) tablet 120 mg  120 mg Oral Q breakfast [name], MD      . OXcarbazepine (TRILEPTAL) tablet 150 mg  150 mg Oral BID [name], MD      . polyethylene glycol (MIRALAX / GLYCOLAX) packet 17 g  17 g Oral Daily Earleen Newport, MD   17 g at 07/31/15 1222  . traZODone  (DESYREL) tablet 100 mg  100 mg Oral QHS [name], MD      . Derrill Memo ON 08/01/2015] venlafaxine XR (EFFEXOR-XR) 24 hr capsule 150 mg  150 mg Oral Q breakfast [name], MD       Current Outpatient Prescriptions  Medication Sig Dispense Refill  . acetaminophen (TYLENOL) 325 MG tablet Take 2 tablets (650 mg total) by mouth every 6 (six) hours as needed for mild pain. 30 tablet 0  . alum & mag hydroxide-simeth (MAALOX/MYLANTA) 200-200-20 MG/5ML suspension Take 30 mLs by mouth every 4 (four) hours as needed for indigestion. 355 mL 0  . amLODipine (NORVASC) 2.5 MG tablet Take 1 tablet (2.5 mg total) by mouth daily. 30 tablet 0  . benztropine (COGENTIN) 1 MG tablet Take 1 tablet (1 mg total) by mouth at bedtime. 30 tablet 0  . docusate sodium (COLACE) 100 MG capsule Take 2 capsules (200 mg total) by mouth 2 (two) times daily. 120 capsule 0  . fluPHENAZine (PROLIXIN) 5 MG tablet Take 1 tablet (  5 mg total) by mouth 3 (three) times daily. 90 tablet 0  . levothyroxine (SYNTHROID, LEVOTHROID) 75 MCG tablet Take 1 tablet (75 mcg total) by mouth daily before breakfast. 30 tablet 0  . Lurasidone HCl 120 MG TABS Take 1 tablet (120 mg total) by mouth daily with supper. 30 tablet 0  . OXcarbazepine (TRILEPTAL) 150 MG tablet Take 1 tablet (150 mg total) by mouth 2 (two) times daily. 60 tablet 0  . traZODone (DESYREL) 50 MG tablet Take 1 tablet (50 mg total) by mouth at bedtime. 30 tablet 0  . venlafaxine XR (EFFEXOR-XR) 150 MG 24 hr capsule Take 1 capsule (150 mg total) by mouth daily with breakfast. 30 capsule 0  . ibuprofen (ADVIL,MOTRIN) 800 MG tablet Take 800 mg by mouth every 8 (eight) hours as needed.      Musculoskeletal: Strength & Muscle Tone: within normal limits Gait & Station: normal Patient leans: N/A  Psychiatric Specialty Exam: Review of Systems  Constitutional: Negative.   HENT: Negative.   Eyes: Negative.   Respiratory: Negative.   Cardiovascular: Negative.   Gastrointestinal:  Positive for abdominal pain and constipation.  Musculoskeletal: Negative.   Skin: Negative.   Neurological: Negative.   Psychiatric/Behavioral: Positive for depression, suicidal ideas and hallucinations. Negative for memory loss and substance abuse. The patient is nervous/anxious and has insomnia.     Blood pressure 122/91, pulse 98, temperature 97.7 F (36.5 C), temperature source Oral, resp. rate 31, height _0  (1.676 m), weight 87.998 kg (194 lb), SpO2 99 %.Body mass index is 31.33 kg/(m^2).  General Appearance: Casual  Eye Contact::  Good  Speech:  Slow  Volume:  Decreased  Mood:  Depressed and Dysphoric  Affect:  Depressed and Tearful  Thought Process:  Goal Directed  Orientation:  Full (Time, Place, and Person)  Thought Content:  Hallucinations: Auditory Command:  She says they tell her to hurt her self  Suicidal Thoughts:  Yes.  with intent/plan  Homicidal Thoughts:  No  Memory:  Immediate;   Good Recent;   Fair Remote;   Fair  Judgement:  Poor  Insight:  Fair  Psychomotor Activity:  Psychomotor Retardation  Concentration:  Fair  Recall:  [employer] of Knowledge:Fair  Language: Fair  Akathisia:  No  Handed:  Right  AIMS (if indicated):     Assets:  Communication Skills Desire for Improvement Financial Resources/Insurance Housing Social Support  ADL's:  Intact  Cognition: WNL  Sleep:      Treatment Plan Summary: Daily contact with patient to assess and evaluate symptoms and progress in treatment, Medication management and Plan Patient continues to endorse suicidal ideation. Medicine appears to still be up in the air as to how much medical treatment and monitoring she needs. If she is medically cleared she would be a candidate for admission to the psychiatry ward but we currently have no beds available. While she is in the emergency room I will go ahead and start her back on her usual medicine which is Latuda, Prolixin, Trileptal and several other medicines. Patient  agrees to the plan. Continue Synthroid for hypothyroidism. Continue close observation because of suicidality. Continued daily monitoring an update. We will work on trying to refer her out to other inpatient units as well.  Disposition: Recommend psychiatric Inpatient admission when medically cleared. Supportive therapy provided about ongoing stressors.  Alethia Berthold, MD 07/31/2015 3:16 PM

## 2015-07-31 NOTE — ED Provider Notes (Signed)
Patient began having abdominal pain approximately 1 PM.  Evaluated her and ordered a CT scan with IV contrast only. Abdomen is soft, and she does not exhibit peritoneal signs.  Earleen Newport, MD 07/31/15 1322

## 2015-07-31 NOTE — ED Notes (Signed)
Pt states swallowed screw 2 days ago, battery approx. 1 week ago, and an earring "weeks ago".  Pt denies pain or discomfort at this time.

## 2015-07-31 NOTE — ED Provider Notes (Signed)
Patient lists an allergy to IV dye, will receive emergency prep.  Earleen Newport, MD 07/31/15 [id number]

## 2015-07-31 NOTE — ED Notes (Signed)
Called pharmacy to ask about scheduling of medications. Pharmacy rep said not to give trileptal, he would reschedule the dose

## 2015-07-31 NOTE — ED Notes (Signed)
Patient transported to CT 

## 2015-07-31 NOTE — ED Notes (Signed)
Patient continues to rest in room with NAD noted. Patient sleeping at this time and has exhibited no behaviors since this RN assumed care at 2300. No anticipated needs. Will continue to monitor.

## 2015-08-01 ENCOUNTER — Inpatient Hospital Stay: Payer: Medicaid Other

## 2015-08-01 DIAGNOSIS — I10 Essential (primary) hypertension: Secondary | ICD-10-CM | POA: Diagnosis present

## 2015-08-01 DIAGNOSIS — F250 Schizoaffective disorder, bipolar type: Secondary | ICD-10-CM | POA: Diagnosis present

## 2015-08-01 DIAGNOSIS — G4700 Insomnia, unspecified: Secondary | ICD-10-CM | POA: Diagnosis present

## 2015-08-01 DIAGNOSIS — Z91041 Radiographic dye allergy status: Secondary | ICD-10-CM | POA: Diagnosis not present

## 2015-08-01 DIAGNOSIS — G2401 Drug induced subacute dyskinesia: Secondary | ICD-10-CM | POA: Diagnosis present

## 2015-08-01 DIAGNOSIS — X58XXXA Exposure to other specified factors, initial encounter: Secondary | ICD-10-CM | POA: Diagnosis present

## 2015-08-01 DIAGNOSIS — Z915 Personal history of self-harm: Secondary | ICD-10-CM | POA: Diagnosis not present

## 2015-08-01 DIAGNOSIS — F17210 Nicotine dependence, cigarettes, uncomplicated: Secondary | ICD-10-CM | POA: Diagnosis present

## 2015-08-01 DIAGNOSIS — K5900 Constipation, unspecified: Secondary | ICD-10-CM | POA: Diagnosis present

## 2015-08-01 DIAGNOSIS — R1084 Generalized abdominal pain: Secondary | ICD-10-CM | POA: Diagnosis present

## 2015-08-01 DIAGNOSIS — T188XXA Foreign body in other parts of alimentary tract, initial encounter: Secondary | ICD-10-CM | POA: Diagnosis present

## 2015-08-01 DIAGNOSIS — E039 Hypothyroidism, unspecified: Secondary | ICD-10-CM | POA: Diagnosis present

## 2015-08-01 DIAGNOSIS — R45851 Suicidal ideations: Secondary | ICD-10-CM | POA: Diagnosis present

## 2015-08-01 DIAGNOSIS — F329 Major depressive disorder, single episode, unspecified: Secondary | ICD-10-CM | POA: Diagnosis present

## 2015-08-01 DIAGNOSIS — E785 Hyperlipidemia, unspecified: Secondary | ICD-10-CM | POA: Diagnosis present

## 2015-08-01 DIAGNOSIS — Z79899 Other long term (current) drug therapy: Secondary | ICD-10-CM | POA: Diagnosis not present

## 2015-08-01 LAB — BASIC METABOLIC PANEL
ANION GAP: 4 — AB (ref 5–15)
BUN: 10 mg/dL (ref 6–20)
CHLORIDE: 113 mmol/L — AB (ref 101–111)
CO2: 24 mmol/L (ref 22–32)
CREATININE: 0.55 mg/dL (ref 0.44–1.00)
Calcium: 9.5 mg/dL (ref 8.9–10.3)
GFR calc non Af Amer: >60 mL/min (ref >60)
GLUCOSE: 95 mg/dL (ref 65–99)
Potassium: 3.6 mmol/L (ref 3.5–5.1)
Sodium: 141 mmol/L (ref 135–145)

## 2015-08-01 LAB — CBC
HEMATOCRIT: 38.9 % (ref 35.0–47.0)
HEMOGLOBIN: 13.4 g/dL (ref 12.0–16.0)
MCH: 31.3 pg (ref 26.0–34.0)
MCHC: 34.3 g/dL (ref 32.0–36.0)
MCV: 91.4 fL (ref 80.0–100.0)
Platelets: 166 10*3/uL (ref 150–440)
RBC: 4.26 MIL/uL (ref 3.80–5.20)
RDW: 13.3 % (ref 11.5–14.5)
WBC: 10.4 10*3/uL (ref 3.6–11.0)

## 2015-08-01 LAB — MRSA PCR SCREENING: MRSA by PCR: NEGATIVE

## 2015-08-01 IMAGING — DX DG CHEST 1V
1 series · 1 of 1 positions shown · non-contrast
Comparison: [K9] chest radiograph.

CLINICAL DATA: Foreign body ingestion

EXAM:
CHEST 1 VIEW

[chest ap]
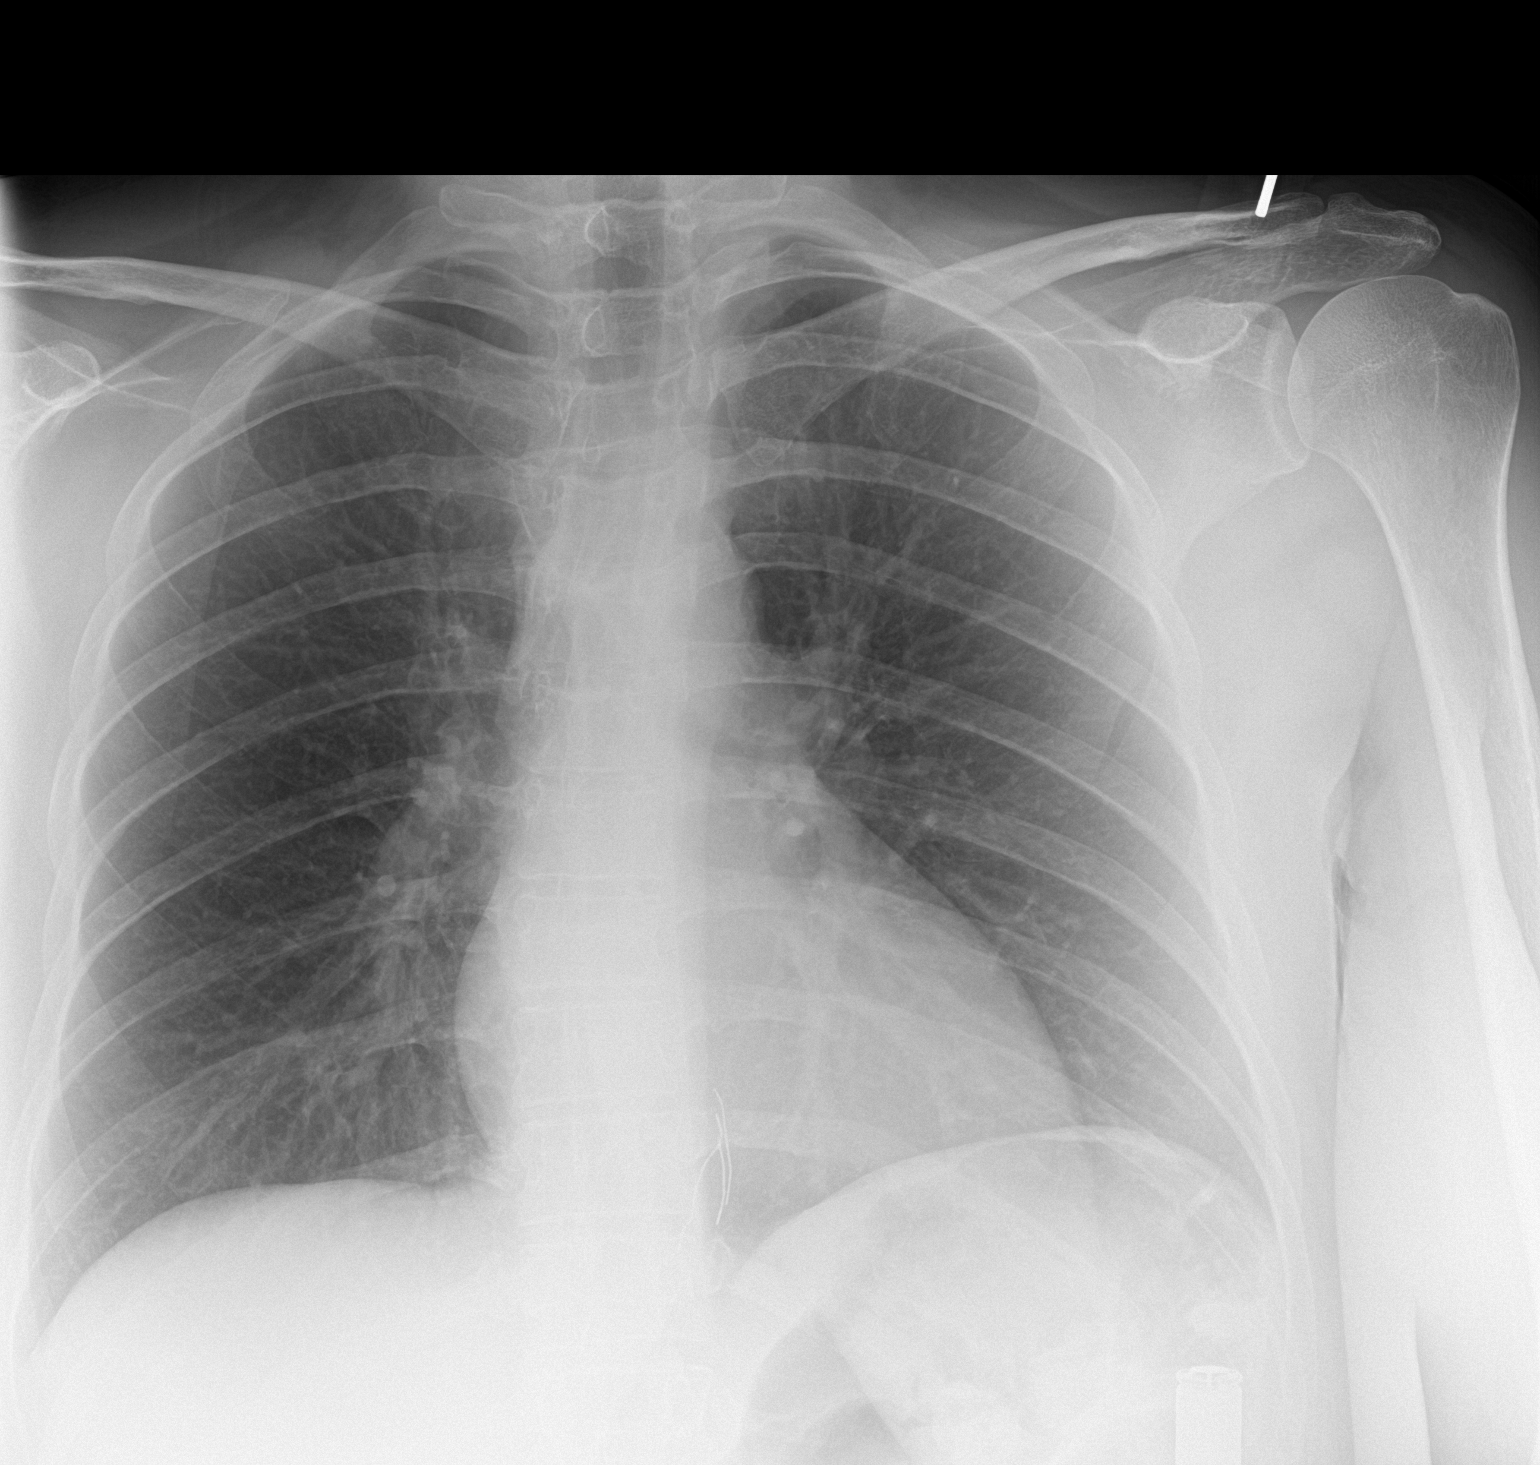

[1 of 1 positions shown; findings below may reference images not displayed]

FINDINGS: There are clustered linear metallic density fragments measuring
approximately 4 cm in length overlying the lower chest near the
midline, unchanged back to at least [DATE]. Stable
cardiomediastinal silhouette with normal heart size. No
pneumothorax. No pleural effusion. Lungs appear clear, with no acute
consolidative airspace disease and no pulmonary edema. Partially
visualized is a metallic density cylindrical structure in the left
upper quadrant of the abdomen, likely within the splenic flexure of
the colon. No evidence of pneumoperitoneum in the visualized upper
abdomen.
IMPRESSION: 1. No active cardiopulmonary disease.
2. Metallic density cylindrical structure partially visualized in
the left upper quadrant of the abdomen, likely within the splenic
flexure of the colon.
3. Stable chronic linear metallic density is overlying the lower
chest near the midline, unchanged since at least [K9].

## 2015-08-01 MED ORDER — SODIUM CHLORIDE 0.9 % IV SOLN
INTRAVENOUS | Status: DC
  Administered 2015-08-01 – 2015-08-04 (×7): via INTRAVENOUS

## 2015-08-01 MED ORDER — LACTULOSE 10 GM/15ML PO SOLN
30.0000 g | Freq: Two times a day (BID) | ORAL | Status: DC
  Administered 2015-08-01 – 2015-08-05 (×9): 30 g via ORAL
  Filled 2015-08-01 (×9): qty 60

## 2015-08-01 MED ORDER — HYDROCODONE-ACETAMINOPHEN 5-325 MG PO TABS
1.0000 | ORAL_TABLET | Freq: Four times a day (QID) | ORAL | Status: DC | PRN
  Administered 2015-08-01 – 2015-08-04 (×3): 1 via ORAL
  Filled 2015-08-01 (×5): qty 1

## 2015-08-01 MED ORDER — FLEET ENEMA 7-19 GM/118ML RE ENEM
1.0000 | ENEMA | Freq: Once | RECTAL | Status: AC
  Administered 2015-08-01: 1 via RECTAL

## 2015-08-01 MED ORDER — SENNA 8.6 MG PO TABS
2.0000 | ORAL_TABLET | Freq: Every day | ORAL | Status: DC
  Administered 2015-08-01 – 2015-08-04 (×4): 17.2 mg via ORAL
  Filled 2015-08-01 (×4): qty 2

## 2015-08-01 MED ORDER — MORPHINE SULFATE (PF) 2 MG/ML IV SOLN
2.0000 mg | INTRAVENOUS | Status: DC | PRN
  Administered 2015-08-01: 2 mg via INTRAVENOUS
  Filled 2015-08-01: qty 1

## 2015-08-01 MED ORDER — ENOXAPARIN SODIUM 40 MG/0.4ML ~~LOC~~ SOLN
40.0000 mg | SUBCUTANEOUS | Status: DC
  Administered 2015-08-01 – 2015-08-04 (×4): 40 mg via SUBCUTANEOUS
  Filled 2015-08-01 (×5): qty 0.4

## 2015-08-01 NOTE — Progress Notes (Signed)
Fleets enema given. All liquid BM. No foreign body noted

## 2015-08-01 NOTE — NC FL2 (Signed)
[place] LEVEL OF CARE SCREENING TOOL     IDENTIFICATION  Patient [name] Birthdate: [birth year] Sex: female Admission Date (Current Location): 07/30/2015  [place] and [place] Number:  [name] [id number] [place] and Address:  [facility], [address], [place], [place] [address]      Provider Number: [id number]  Attending Physician Name and Address:  Loletha Grayer, MD  Relative Name and Phone Number:       Current Level of Care: Hospital Recommended Level of Care: Other (Comment) (Group Home) Prior Approval Number:    Date Approved/Denied:   PASRR Number:    Discharge Plan: Other (Comment) ([place])    Current Diagnoses: Patient Active Problem List   Diagnosis Date Noted  . Foreign body ingestion 07/31/2015  . Suicidal ideation 06/15/2015  . 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 Schizoaffective disorder, bipolar type ([place]) 11/06/2014  . Tardive dyskinesia 10/06/2014  . Tobacco use disorder 09/30/2014  . Hypothyroidism 09/29/2014  . Hypertension 09/29/2014  . Constipation 09/29/2014    Orientation RESPIRATION BLADDER Height & Weight     Self, Time, Situation, Place    Continent Weight: 192 lb 1.6 oz (87.136 kg) Height:  5\' 6"  (167.6 cm)  BEHAVIORAL SYMPTOMS/MOOD NEUROLOGICAL BOWEL NUTRITION STATUS  Dangerous to self, others or property   Continent    AMBULATORY STATUS COMMUNICATION OF NEEDS Skin   Independent Verbally Normal                       Personal Care Assistance Level of Assistance  Bathing, Feeding, Dressing, Total care Bathing Assistance: Independent Feeding assistance: Independent Dressing Assistance: Independent Total Care Assistance: Independent   Functional Limitations Info  Sight, Hearing, Speech Sight Info: Adequate Hearing Info: Adequate Speech Info: Adequate    SPECIAL CARE FACTORS FREQUENCY                       Contractures       Additional Factors Info  Psychotropic               Current Medications (08/01/2015):  This is the current hospital active medication list Current Facility-Administered Medications  Medication Dose Route Frequency Provider Last Rate Last Dose  . 0.9 %  sodium chloride infusion   Intravenous Continuous Sylvan Cheese, MD 100 mL/hr at 08/01/15 1226    . amLODipine (NORVASC) tablet 2.5 mg  2.5 mg Oral Daily [name], MD   2.5 mg at 08/01/15 [id number]  . benztropine (COGENTIN) tablet 1 mg  1 mg Oral QHS [name], MD   1 mg at 07/31/15 2213  . docusate sodium (COLACE) capsule 200 mg  200 mg Oral BID [name], MD   200 mg at 08/01/15 [id number]  . enoxaparin (LOVENOX) injection 40 mg  40 mg Subcutaneous Q24H Sylvan Cheese, MD   40 mg at 08/01/15 0306  . fluPHENAZine (PROLIXIN) tablet 5 mg  5 mg Oral 3 times per day [name], MD   5 mg at 08/01/15 1233  . HYDROcodone-acetaminophen (NORCO/VICODIN) 5-325 MG per tablet 1 tablet  1 tablet Oral Q6H PRN Loletha Grayer, MD      . levothyroxine (SYNTHROID, LEVOTHROID) tablet 75 mcg  75 mcg Oral QAC breakfast [name], MD   75 mcg at 08/01/15 [id number]  . lurasidone (LATUDA) tablet 120 mg  120 mg Oral Q breakfast [name], MD   120 mg  at 08/01/15 [id number]  . OXcarbazepine (TRILEPTAL) tablet 150 mg  150 mg Oral BID [name], MD   150 mg at 08/01/15 1226  . polyethylene glycol (MIRALAX / GLYCOLAX) packet 17 g  17 g Oral Daily Earleen Newport, MD   17 g at 08/01/15 [id number]  . senna (SENOKOT) tablet 17.2 mg  2 tablet Oral QHS Loletha Grayer, MD      . traZODone (DESYREL) tablet 100 mg  100 mg Oral QHS [name], MD   100 mg at 07/31/15 2213  . venlafaxine XR (EFFEXOR-XR) 24 hr capsule 150 mg  150 mg Oral Q breakfast [name], MD   150 mg at 08/01/15 [id number]     Discharge Medications: Please see discharge summary for a list of discharge medications.  Relevant Imaging Results:  Relevant Lab Results:   Additional  Information Allergies: Betadine, Shell fish derived products Iodine  SSN # [id number]  Esa Raden, [place], LCSW
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
## 2015-08-01 NOTE — Progress Notes (Signed)
Pt arrived to unit, VSS, room check completed with tech, suicide precautions in place, and sitter is at bedside. Pt denies thoughts of harming self at this time, given emotional support. Pt instructed to notify RN of increased abdominal pain and to save all output for examination.

## 2015-08-01 NOTE — Progress Notes (Signed)
Spoke with [facility] SW and Dr Earleen Newport. Pt diet order modified for behavioral purposes. No soda, no french fries, no cheeseburgers or hamburgers.

## 2015-08-01 NOTE — ED Notes (Signed)
Called lab about add on Thyroid panel
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
## 2015-08-01 NOTE — Progress Notes (Signed)
   08/01/15 1100  Clinical Encounter Type  Visited With Patient  Visit Type Initial  Referral From Nurse  Spiritual Encounters  Spiritual Needs Prayer;Emotional  Stress Factors  Patient Stress Factors Health changes  Chaplain Marcello Moores engaged assigned nurse to learn about patient. Chaplain engaged patient and offered support. Chaplain will follow up and engage patient at patient request. Marcello Moores

## 2015-08-01 NOTE — Progress Notes (Signed)
Initial Nutrition Assessment    INTERVENTION:   Meals and Snacks: Cater to patient preferences Medical Food Supplement Therapy: if po intake inadequate on follow-up, may benefit from addition of nutritional supplement  NUTRITION DIAGNOSIS:   Reassess on follow-up  GOAL:   Patient will meet greater than or equal to 90% of their needs  MONITOR:   PO intake, Weight trends, Labs  REASON FOR ASSESSMENT:   Malnutrition Screening Tool    ASSESSMENT:    Pt admitted after foreign body ingestion of a nail, battery and earring with abdominal pain; pt currently with schizoaffective disorder with multiple psychiatric issues as well as SI with hx of foreign body ingestion in the past; pt currently with auditory hallucinations, pt with 1:1 sitter  Past Medical History  Diagnosis Date  . Hallucinations 09/30/2014    Sizoaffective  . Depression   . Anxiety   . Hypertension   . Tardive dyskinesia 10/2014    recent onset  . GERD (gastroesophageal reflux disease)   . Hyperlipidemia     Diet Order:  Diet regular Room service appropriate?: Yes; Fluid consistency:: Thin   Energy Intake: per Hinton Dyer RN, pt tolerating diet with good appetite at present, pt at breakfast well this AM  Digestive System: +BM, just received enema, awaiting foreign bodies to pass via stool   Recent Labs Lab 07/30/15 1838 08/01/15 0612  NA 140 141  K 4.2 3.6  CL 111 113*  CO2 24 24  BUN 8 10  CREATININE 0.81 0.55  CALCIUM 9.5 9.5  GLUCOSE 64* 95    Glucose Profile: No results for input(s): GLUCAP in the last 72 hours. Meds: NS at 100 ml/hr, fleet enema, miralax  Height:   Ht Readings from Last 1 Encounters:  07/30/15 5\' 6"  (1.676 m)    Weight: wt appears stable since January per wt encounters   Wt Readings from Last 1 Encounters:  08/01/15 192 lb 1.6 oz (87.136 kg)   Wt Readings from Last 10 Encounters:  08/01/15 192 lb 1.6 oz (87.136 kg)  07/13/15 195 lb (88.451 kg)  06/30/15 190 lb  (86.183 kg)  06/22/15 192 lb (87.091 kg)  06/15/15 192 lb (87.091 kg)  06/15/15 190 lb (86.183 kg)  06/13/15 190 lb (86.183 kg)  12/05/14 212 lb (96.163 kg)  12/03/14 220 lb (99.791 kg)  11/28/14 220 lb (99.791 kg)   BMI:  Body mass index is 31.02 kg/(m^2).  EDUCATION NEEDS:   Education needs no appropriate at this time  Kerman Passey [place], [place], [place] [phone] Pager  [phone] Weekend/On-Call Pager

## 2015-08-01 NOTE — Plan of Care (Signed)
Problem: Safety: Goal: Ability to remain free from injury will improve Outcome: Progressing Sitter at bedside  Problem: Health Behavior/Discharge Planning: Goal: Ability to manage health-related needs will improve Outcome: Not Progressing Pt an IVC

## 2015-08-01 NOTE — Progress Notes (Signed)
   08/01/15 1700  Clinical Encounter Type  Visited With Patient  Visit Type Follow-up  Referral From Nurse  Chaplain dropped off a Bible to patient at Patient request. Melanie Bell

## 2015-08-01 NOTE — ED Notes (Signed)
Called Lab and spoke with Murray Hodgkins who is going to add on Thyroid Panel with TSH to blood already in lab

## 2015-08-01 NOTE — ED Notes (Addendum)
Per admitting MD order, pt's stool has to be examined, and RN/Tech must chart when pt has passed earring, battery, and nail.

## 2015-08-01 NOTE — Progress Notes (Signed)
Psychiatry aware of consult. Came to evaluate pt but she was indisposed after receiving enema. Will come and evaluate tomorrow.

## 2015-08-01 NOTE — Progress Notes (Signed)
Patient ID: Melanie Bell, female   DOB: 01/18/1991, 25 y.o.   MRN: [id number] [facility] PROGRESS NOTE  Melanie Bell [id number] DOB: [birth year] DOA: 07/30/2015 PCP: Casilda Carls, MD  HPI/Subjective: Patient with abdominal pain. 4 out of 10 intensity in the left lower quadrant. No nausea or vomiting. She states he has not had a bowel movement in a while. She states the voices tell her to swallow things.  Objective: Filed Vitals:   08/01/15 0219 08/01/15 0455  BP: 125/71 117/70  Pulse: 64 121  Temp: 98.3 F (36.8 C) 97.9 F (36.6 C)  Resp: 22 20    Filed Weights   07/30/15 1833 08/01/15 0219  Weight: 87.998 kg (194 lb) 87.136 kg (192 lb 1.6 oz)    ROS: Review of Systems  Constitutional: Negative for fever and chills.  Eyes: Negative for blurred vision.  Respiratory: Negative for cough and shortness of breath.   Cardiovascular: Negative for chest pain.  Gastrointestinal: Positive for abdominal pain and constipation. Negative for nausea, vomiting and diarrhea.  Genitourinary: Negative for dysuria.  Musculoskeletal: Negative for joint pain.  Neurological: Negative for dizziness and headaches.   Exam: Physical Exam  HENT:  Nose: No mucosal edema.  Mouth/Throat: No oropharyngeal exudate or posterior oropharyngeal edema.  Eyes: Conjunctivae, EOM and lids are normal. Pupils are equal, round, and reactive to light.  Neck: No JVD present. Carotid bruit is not present. No edema present. No thyroid mass and no thyromegaly present.  Cardiovascular: S1 normal and S2 normal.  Exam reveals no gallop.   No murmur heard. Pulses:      Dorsalis pedis pulses are 2+ on the right side, and 2+ on the left side.  Respiratory: No respiratory distress. She has no wheezes. She has no rhonchi. She has no rales.  GI: Soft. Bowel sounds are normal. There is tenderness in the left lower quadrant.  Musculoskeletal:       Right ankle: She exhibits no swelling.       Left  ankle: She exhibits no swelling.  Lymphadenopathy:    She has no cervical adenopathy.  Neurological: She is alert. No cranial nerve deficit.  Skin: Skin is warm. No rash noted. Nails show no clubbing.  Psychiatric: Her affect is blunt.      Data Reviewed: Basic Metabolic Panel:  Recent Labs Lab 07/30/15 1838 08/01/15 0612  NA 140 141  K 4.2 3.6  CL 111 113*  CO2 24 24  GLUCOSE 64* 95  BUN 8 10  CREATININE 0.81 0.55  CALCIUM 9.5 9.5   Liver Function Tests:  Recent Labs Lab 07/30/15 1838  AST 17  ALT 8*  ALKPHOS 90  BILITOT 0.3  PROT 7.7  ALBUMIN 4.4   CBC:  Recent Labs Lab 07/30/15 1838 08/01/15 0612  WBC 7.8 10.4  HGB 13.1 13.4  HCT 39.0 38.9  MCV 91.9 91.4  PLT 209 166     Recent Results (from the past 240 hour(s))  MRSA PCR Screening     Status: None   Collection Time: 08/01/15  3:25 AM  Result Value Ref Range Status   MRSA by PCR NEGATIVE NEGATIVE Final    Comment:        The GeneXpert MRSA Assay (FDA approved for NASAL specimens only), is one component of a comprehensive MRSA colonization surveillance program. It is not intended to diagnose MRSA infection nor to guide or monitor treatment for MRSA infections.      Studies: Dg Chest 1 View  08/01/2015  CLINICAL DATA:  Foreign body ingestion EXAM: CHEST 1 VIEW COMPARISON:  3167 chest radiograph. FINDINGS: There are clustered linear metallic density fragments measuring approximately 4 cm in length overlying the lower chest near the midline, unchanged back to at least 09/12/2010. Stable cardiomediastinal silhouette with normal heart size. No pneumothorax. No pleural effusion. Lungs appear clear, with no acute consolidative airspace disease and no pulmonary edema. Partially visualized is a metallic density cylindrical structure in the left upper quadrant of the abdomen, likely within the splenic flexure of the colon. No evidence of pneumoperitoneum in the visualized upper abdomen. IMPRESSION: 1.  No active cardiopulmonary disease. 2. Metallic density cylindrical structure partially visualized in the left upper quadrant of the abdomen, likely within the splenic flexure of the colon. 3. Stable chronic linear metallic density is overlying the lower chest near the midline, unchanged since at least 2012. Electronically Signed   By: Ilona Sorrel M.D.   On: 08/01/2015 09:27   Dg Abd 1 View  07/31/2015  CLINICAL DATA:  Follow-up location of foreign bodies within the abdomen. Subsequent encounter. EXAM: ABDOMEN - 1 VIEW COMPARISON:  Abdominal radiograph performed earlier today at 7:39 p.m. FINDINGS: Two radiopaque foreign bodies are seen, though the pelvis is not imaged on this study. The tubular metal structure is noted overlying the left mid abdomen, while the nail is noted overlying the right lower abdomen. Their locations are not well assessed on radiograph. The triangular metal structure, if still present, is likely outside of the field of view. No definite free intra-abdominal air is seen on the provided upright view. The visualized lung bases are grossly clear. No acute osseous abnormalities are identified. IMPRESSION: Two radiopaque foreign bodies noted. The pelvis is not imaged on this study. The positions of these foreign bodies not well assessed on radiograph, though they have likely progressed mildly since the prior study. The triangular metal structure is likely outside of the field of view. No free intra-abdominal air seen. Electronically Signed   By: Garald Balding M.D.   On: 07/31/2015 21:07   Dg Abd 1 View  07/31/2015  CLINICAL DATA:  Ingested foreign bodies EXAM: ABDOMEN - 1 VIEW COMPARISON:  Abdominal radiographs July 30, 2015; CT abdomen and pelvis obtained earlier today FINDINGS: There are 3 metallic foreign bodies. A a metallic screw is noted in the right mid abdomen. A cylindrical metallic foreign body is located in the left mid abdomen. A metallic triangular-shaped structure is located  in the right lower quadrant. These devices have moved only slightly since earlier today. No free air is evident on this supine examination. No bowel obstruction. Diffuse stool throughout colon. Urinary bladder is distended with contrast. IMPRESSION: There are 3 metallic foreign bodies in the lower abdomen and pelvis, little changed from earlier in the day. The cylindrical metallic foreign body is in the mid transverse colon. The screw is in the mid ileum region. The triangular-shaped metallic foreign body is probably within the sigmoid colon; its location is more difficult to assess precisely. No demonstrable bowel obstruction or free air is evident on this supine examination. Diffuse stool is noted throughout the colon. Electronically Signed   By: Lowella Grip III M.D.   On: 07/31/2015 20:01   Ct Abdomen Pelvis W Contrast  07/31/2015  CLINICAL DATA:  Abdominal pain and ingested multiple metallic objects. EXAM: CT ABDOMEN AND PELVIS WITH CONTRAST TECHNIQUE: Multidetector CT imaging of the abdomen and pelvis was performed using the standard protocol following bolus administration of intravenous  contrast. CONTRAST:  110mL OMNIPAQUE IOHEXOL 300 MG/ML  SOLN COMPARISON:  Abdominal radiograph 07/30/2015 and CT 08/17/2014 FINDINGS: Lower chest:  Lung bases are clear.  No pleural effusions. Hepatobiliary: Normal appearance of the liver and gallbladder. Pancreas: Normal appearance of the pancreas without inflammation or duct dilatation. Spleen: Normal appearance of spleen without enlargement. Adrenals/Urinary Tract: Normal adrenal glands. Normal appearance of the kidneys without stones or hydronephrosis. Normal appearance the urinary bladder. Stomach/Bowel: Again noted are 3 metallic structures within the bowel. These 3 metallic structures were present on the recent abdominal radiograph from 07/30/2015. The cylinder shaped structure is within the left transverse colon. This cylinder shaped structure has moved towards  the left compared to the recent comparison examination. There is a triangular-shaped metallic structure in the mid lower abdomen which has minimally changed in location. This could be within colon but difficult to evaluate the exact location due to the streak artifact. There is a metallic screw within the small bowel in the right abdomen. This screw was on the left side of the abdomen on 07/30/2015. Large amount of stool throughout the colon. No evidence for a bowel obstruction. No significant bowel wall thickening. There is oral contrast in small bowel and the right colon. Vascular/Lymphatic: No significant lymphadenopathy. Normal appearance of the vasculature. Portal venous system is patent. Reproductive: Normal appearance of the uterus and adnexal structures. Other: No evidence for free air or free fluid. 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 IMPRESSION: There are three ingested foreign bodies within the bowel. Two of these foreign bodies have changed location from the exam on 07/30/2015. There is no evidence for a bowel obstruction or bowel perforation. No significant bowel inflammation. Large amount of stool in the colon. Electronically Signed   By: Markus Daft M.D.   On: 07/31/2015 16:40   Dg Abd Acute W/chest  07/30/2015  CLINICAL DATA:  Patient complains of low abdomen pain after swelling a screw yesterday. EXAM: DG ABDOMEN ACUTE W/ 1V CHEST COMPARISON:  July 03, 2015 FINDINGS: There is no evidence of dilated bowel loops or free intraperitoneal air. There is a radiopaque screw projected in the lower left abdomen. A second triangular-shaped radiopaque density is projected in the pelvis. There is a third cylinder like radiopaque density projected in the mid lower abdomen. Heart size and mediastinal contours are within normal limits. Both lungs are clear. IMPRESSION: Three radiopaque foreign bodies projected in  the lower abdomen and pelvis. There is no free air. Electronically Signed   By: Abelardo Diesel M.D.   On: 07/30/2015 19:56    Scheduled Meds: . amLODipine  2.5 mg Oral Daily  . benztropine  1 mg Oral QHS  . docusate sodium  200 mg Oral BID  . enoxaparin (LOVENOX) injection  40 mg Subcutaneous Q24H  . fluPHENAZine  5 mg Oral 3 times per day  . lactulose  30 g Oral BID  . levothyroxine  75 mcg Oral QAC breakfast  . lurasidone  120 mg Oral Q breakfast  . Oxcarbazepine  150 mg Oral BID  . polyethylene glycol  17 g Oral Daily  . senna  2 tablet Oral QHS  . traZODone  100 mg Oral QHS  . venlafaxine XR  150 mg Oral Q breakfast   Continuous Infusions: . sodium chloride 100 mL/hr at 08/01/15 1226    Assessment/Plan:  1. Abdominal pain, constipation, swallowing foreign bodies including an earring, a battery and a screw.  Case discussed with surgicalist. Because the battery is not in the stomach, we can watch it at this point. Since the patient does have severe constipation I tried a Fleet enema which did not help. No bowel movement yet with Colace or MiraLAX. I added senna. I will add lactulose until bowel movement. 2. Essential hypertension. Patient is on amlodipine. This may be contributing to the patient's constipation. 3. Hypothyroidism unspecified continue levothyroxine 4. Schizoaffective disease versus bipolar depression. Psychiatry following in continuing her usual medications. The patient states that the voices make her swallow things. Behavioral nurse saw the patient and recommended changing the dietary things here at the hospital that she doesn't have at the facility.  Code Status:     Code Status Orders        Start     Ordered   08/01/15 0218  Full code   Continuous     08/01/15 0217    Code Status History    Date Active Date Inactive Code Status Order ID Comments User Context   06/15/2015 11:33 PM 06/19/2015  6:49 PM Full Code [id number]  [name], MD Inpatient    12/05/2014  2:50 PM 12/09/2014  6:32 PM Full Code [id number]  [name], MD Inpatient   11/27/2014  8:18 PM 11/28/2014  8:43 PM Full Code [id number]  Hillary Bow, MD ED   11/06/2014 12:26 AM 11/10/2014  7:18 PM Full Code [id number]  [name], MD Inpatient   09/30/2014  1:23 PM 10/06/2014  2:06 PM Full Code [id number]  [name], MD Inpatient     Disposition Plan: To the psychiatry floor once she has bowel movements  Consultants:  Psychiatry  Time spent: 20 minutes  Loletha Grayer  [facility]

## 2015-08-01 NOTE — H&P (Signed)
[facility] [name]    MR#:  [id number]  DATE OF BIRTH:  [birth year]  DATE OF ADMISSION:  07/30/2015  PRIMARY CARE PHYSICIAN: Casilda Carls, MD   REQUESTING/REFERRING PHYSICIAN:   CHIEF COMPLAINT:   Chief Complaint  Patient presents with  . Mental Health Problem    hearing voices.    HISTORY OF PRESENT ILLNESS: Melanie Bell  is a 25 y.o. female with a known history of Depression, schizoaffective disorder, prior foreign body ingestions, constipation, tardive dyskinesia, hypothyroidism, tobacco abuse and hypertension. She was brought to the ED from her group home 2 days ago due to ingestion of a nail, a battery and an earring in an attempt to commit suicide. She has ordered a treat hallucinations that have told her to hurt herself and she has had prior suicidal ideations. She has had foreign body ingestions in the past that resulted in 2 previous laparotomies. On arrival in the ED, she was involuntarily committed and was seen by psychiatry. However, patient has subsequently developed abdominal pain. Serial abdominal x-rays have visualize a foreign bodies and a CT scan obtained within the last 12 hours showed all 3 foreign bodies. Patient states that her abdominal pain is currently only in the lower abdomen and she rates it at 3/10 after she received morphine. She has no other complaint. She specifically denies any nausea, vomiting, changes in bowel or urine habits.   Labs obtained yesterday show unremarkable CBC and CMP with negative urine drug screen. Acetaminophen, salicylate and alcohol levels are also negative. She will be admitted due to abdominal pain and serial x-rays will be obtained until she passes foreign bodies. She is full code.  PAST MEDICAL HISTORY:   Past Medical History  Diagnosis Date  . Hallucinations 09/30/2014    Sizoaffective  . Depression   . Anxiety   . Hypertension   . Tardive dyskinesia  10/2014    recent onset  . GERD (gastroesophageal reflux disease)   . Hyperlipidemia     PAST SURGICAL HISTORY: Past Surgical History  Procedure Laterality Date  . Wisdom tooth extraction    . Esophagogastroduodenoscopy N/A 11/28/2014    Procedure: ESOPHAGOGASTRODUODENOSCOPY (EGD);  Surgeon: [name], MD;  Location: [facility] ENDOSCOPY;  Service: Endoscopy;  Laterality: N/A;  . Abdominal surgery      "years ago" to remove foreign objects  . Breast lumpectomy      SOCIAL HISTORY:  Social History  Substance Use Topics  . Smoking status: Current Every Day Smoker -- 0.50 packs/day for 3 years    Types: Cigarettes  . Smokeless tobacco: Never Used  . Alcohol Use: No    FAMILY HISTORY:  Family History  Problem Relation Age of Onset  . Depression Mother   . Hypertension Mother     DRUG ALLERGIES:  Allergies  Allergen Reactions  . Betadine [Povidone Iodine] Other (See Comments)    Reaction:  Unknown   . Shellfish-Derived Products Other (See Comments)    Reaction:  Unknown   . Iodine Rash    REVIEW OF SYSTEMS:   CONSTITUTIONAL: No fever, fatigue or weakness.  EYES: No blurred or double vision.  EARS, NOSE, AND THROAT: No tinnitus or ear pain.  RESPIRATORY: No cough, shortness of breath, wheezing or hemoptysis.  CARDIOVASCULAR: No chest pain, orthopnea, edema.  GASTROINTESTINAL: No nausea, vomiting, diarrhea. + Constipation GENITOURINARY: No dysuria, hematuria.  ENDOCRINE: No polyuria, nocturia,  HEMATOLOGY: No anemia, easy bruising or bleeding  SKIN: No rash or lesion. MUSCULOSKELETAL: No joint pain or arthritis.   NEUROLOGIC: No tingling, numbness, weakness.  PSYCHIATRY: As in history of present illness  MEDICATIONS AT HOME:  Prior to Admission medications   Medication Sig Start Date End Date Taking? Authorizing Provider  acetaminophen (TYLENOL) 325 MG tablet Take 2 tablets (650 mg total) by mouth every 6 (six) hours as needed for mild pain. 06/19/15  Yes [name], MD  alum & mag hydroxide-simeth (MAALOX/MYLANTA) [id number] MG/5ML suspension Take 30 mLs by mouth every 4 (four) hours as needed for indigestion. 06/19/15  Yes [name] [name], MD  amLODipine (NORVASC) 2.5 MG tablet Take 1 tablet (2.5 mg total) by mouth daily. 06/19/15  Yes [name] [name], MD  benztropine (COGENTIN) 1 MG tablet Take 1 tablet (1 mg total) by mouth at bedtime. 06/19/15  Yes [name] [name], MD  docusate sodium (COLACE) 100 MG capsule Take 2 capsules (200 mg total) by mouth 2 (two) times daily. 06/19/15  Yes [name] [name], MD  fluPHENAZine (PROLIXIN) 5 MG tablet Take 1 tablet (5 mg total) by mouth 3 (three) times daily. 06/19/15  Yes [name] [name], MD  levothyroxine (SYNTHROID, LEVOTHROID) 75 MCG tablet Take 1 tablet (75 mcg total) by mouth daily before breakfast. 06/19/15  Yes [name] [name], MD  Lurasidone HCl 120 MG TABS Take 1 tablet (120 mg total) by mouth daily with supper. 06/19/15  Yes [name] [name], MD  OXcarbazepine (TRILEPTAL) 150 MG tablet Take 1 tablet (150 mg total) by mouth 2 (two) times daily. 06/19/15  Yes [name] [name], MD  traZODone (DESYREL) 50 MG tablet Take 1 tablet (50 mg total) by mouth at bedtime. 06/19/15  Yes [name], MD  venlafaxine XR (EFFEXOR-XR) 150 MG 24 hr capsule Take 1 capsule (150 mg total) by mouth daily with breakfast. 06/19/15  Yes [name] [name], MD  ibuprofen (ADVIL,MOTRIN) 800 MG tablet Take 800 mg by mouth every 8 (eight) hours as needed.    Historical Provider, MD      PHYSICAL EXAMINATION:   VITAL SIGNS: Blood pressure 100/58, pulse 69, temperature 97.7 F (36.5 C), temperature source Oral, resp. rate 18, height 5\' 6"  (1.676 m), weight 87.998 kg (194 lb), SpO2 96 %.  GENERAL:  25 y.o.-year-old patient lying in the bed with no acute distress. Alert and oriented x 3. EYES: Pupils equal, round, reactive to light and accommodation. No scleral icterus. Extraocular muscles intact.   HEENT: Head atraumatic, normocephalic. Oropharynx and nasopharynx clear.  NECK:  Supple, no jugular venous distention. No thyroid enlargement, no tenderness.  LUNGS: Normal breath sounds bilaterally, no wheezing, rales,rhonchi or crepitation. No use of accessory muscles of respiration.  CARDIOVASCULAR: S1, S2 normal. No murmurs, rubs, or gallops.  ABDOMEN: Soft, Mild infraumbilical abdominal tenderness to palpation with minimal guarding, nondistended. Bowel sounds present. No organomegaly or mass.  EXTREMITIES: No pedal edema, cyanosis, or clubbing.  NEUROLOGIC: Cranial nerves II through XII are intact. Muscle strength 5/5 in all extremities. Sensation intact. Gait not checked.   SKIN: No obvious rash, lesion, or ulcer.   LABORATORY PANEL:   CBC  Recent Labs Lab 07/30/15 1838  WBC 7.8  HGB 13.1  HCT 39.0  PLT 209  MCV 91.9  MCH 30.8  MCHC 33.5  RDW 13.5   ------------------------------------------------------------------------------------------------------------------  Chemistries   Recent Labs Lab 07/30/15 1838  NA 140  K 4.2  CL 111  CO2 24  GLUCOSE 64*  BUN 8  CREATININE  0.81  CALCIUM 9.5  AST 17  ALT 8*  ALKPHOS 90  BILITOT 0.3   ------------------------------------------------------------------------------------------------------------------ estimated creatinine clearance is 117.6 mL/min (by C-G formula based on Cr of 0.81). ------------------------------------------------------------------------------------------------------------------ No results for input(s): TSH, T4TOTAL, T3FREE, THYROIDAB in the last 72 hours.  Invalid input(s): FREET3   Coagulation profile No results for input(s): INR, PROTIME in the last 168 hours. ------------------------------------------------------------------------------------------------------------------- No results for input(s): DDIMER in the last 72  hours. -------------------------------------------------------------------------------------------------------------------  Cardiac Enzymes No results for input(s): CKMB, TROPONINI, MYOGLOBIN in the last 168 hours.  Invalid input(s): CK ------------------------------------------------------------------------------------------------------------------ Invalid input(s): POCBNP  ---------------------------------------------------------------------------------------------------------------  Urinalysis    Component Value Date/Time   COLORURINE YELLOW* 12/08/2014 1152   COLORURINE Straw 09/01/2014 1756   APPEARANCEUR HAZY* 12/08/2014 1152   APPEARANCEUR Clear 09/01/2014 1756   LABSPEC 1.003* 12/08/2014 1152   LABSPEC 1.005 09/01/2014 1756   PHURINE 7.0 12/08/2014 1152   PHURINE 6.0 09/01/2014 1756   GLUCOSEU NEGATIVE 12/08/2014 1152   GLUCOSEU Negative 09/01/2014 1756   HGBUR 2+* 12/08/2014 1152   HGBUR Negative 09/01/2014 1756   BILIRUBINUR NEGATIVE 12/08/2014 1152   BILIRUBINUR Negative 09/01/2014 1756   KETONESUR NEGATIVE 12/08/2014 1152   KETONESUR Negative 09/01/2014 1756   PROTEINUR NEGATIVE 12/08/2014 1152   PROTEINUR Negative 09/01/2014 1756   NITRITE NEGATIVE 12/08/2014 1152   NITRITE Negative 09/01/2014 1756   LEUKOCYTESUR 3+* 12/08/2014 1152   LEUKOCYTESUR Negative 09/01/2014 1756     RADIOLOGY: Dg Abd 1 View  07/31/2015  CLINICAL DATA:  Follow-up location of foreign bodies within the abdomen. Subsequent encounter. EXAM: ABDOMEN - 1 VIEW COMPARISON:  Abdominal radiograph performed earlier today at 7:39 p.m. FINDINGS: Two radiopaque foreign bodies are seen, though the pelvis is not imaged on this study. The tubular metal structure is noted overlying the left mid abdomen, while the nail is noted overlying the right lower abdomen. Their locations are not well assessed on radiograph. The triangular metal structure, if still present, is likely outside of the field of view. No  definite free intra-abdominal air is seen on the provided upright view. The visualized lung bases are grossly clear. No acute osseous abnormalities are identified. IMPRESSION: Two radiopaque foreign bodies noted. The pelvis is not imaged on this study. The positions of these foreign bodies not well assessed on radiograph, though they have likely progressed mildly since the prior study. The triangular metal structure is likely outside of the field of view. No free intra-abdominal air seen. Electronically Signed   By: Garald Balding M.D.   On: 07/31/2015 21:07   Dg Abd 1 View  07/31/2015  CLINICAL DATA:  Ingested foreign bodies EXAM: ABDOMEN - 1 VIEW COMPARISON:  Abdominal radiographs July 30, 2015; CT abdomen and pelvis obtained earlier today FINDINGS: There are 3 metallic foreign bodies. A a metallic screw is noted in the right mid abdomen. A cylindrical metallic foreign body is located in the left mid abdomen. A metallic triangular-shaped structure is located in the right lower quadrant. These devices have moved only slightly since earlier today. No free air is evident on this supine examination. No bowel obstruction. Diffuse stool throughout colon. Urinary bladder is distended with contrast. IMPRESSION: There are 3 metallic foreign bodies in the lower abdomen and pelvis, little changed from earlier in the day. The cylindrical metallic foreign body is in the mid transverse colon. The screw is in the mid ileum region. The triangular-shaped metallic foreign body is probably within the sigmoid colon; its location is more difficult to assess precisely.  No demonstrable bowel obstruction or free air is evident on this supine examination. Diffuse stool is noted throughout the colon. Electronically Signed   By: Lowella Grip III M.D.   On: 07/31/2015 20:01   Ct Abdomen Pelvis W Contrast  07/31/2015  CLINICAL DATA:  Abdominal pain and ingested multiple metallic objects. EXAM: CT ABDOMEN AND PELVIS WITH CONTRAST  TECHNIQUE: Multidetector CT imaging of the abdomen and pelvis was performed using the standard protocol following bolus administration of intravenous contrast. CONTRAST:  152mL OMNIPAQUE IOHEXOL 300 MG/ML  SOLN COMPARISON:  Abdominal radiograph 07/30/2015 and CT 08/17/2014 FINDINGS: Lower chest:  Lung bases are clear.  No pleural effusions. Hepatobiliary: Normal appearance of the liver and gallbladder. Pancreas: Normal appearance of the pancreas without inflammation or duct dilatation. Spleen: Normal appearance of spleen without enlargement. Adrenals/Urinary Tract: Normal adrenal glands. Normal appearance of the kidneys without stones or hydronephrosis. Normal appearance the urinary bladder. Stomach/Bowel: Again noted are 3 metallic structures within the bowel. These 3 metallic structures were present on the recent abdominal radiograph from 07/30/2015. The cylinder shaped structure is within the left transverse colon. This cylinder shaped structure has moved towards the left compared to the recent comparison examination. There is a triangular-shaped metallic structure in the mid lower abdomen which has minimally changed in location. This could be within colon but difficult to evaluate the exact location due to the streak artifact. There is a metallic screw within the small bowel in the right abdomen. This screw was on the left side of the abdomen on 07/30/2015. Large amount of stool throughout the colon. No evidence for a bowel obstruction. No significant bowel wall thickening. There is oral contrast in small bowel and the right colon. Vascular/Lymphatic: No significant lymphadenopathy. Normal appearance of the vasculature. Portal venous system is patent. Reproductive: Normal appearance of the uterus and adnexal structures. Other: No evidence for free air or free fluid. 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 IMPRESSION:  There are three ingested foreign bodies within the bowel. Two of these foreign bodies have changed location from the exam on 07/30/2015. There is no evidence for a bowel obstruction or bowel perforation. No significant bowel inflammation. Large amount of stool in the colon. Electronically Signed   By: Markus Daft M.D.   On: 07/31/2015 16:40   Dg Abd Acute W/chest  07/30/2015  CLINICAL DATA:  Patient complains of low abdomen pain after swelling a screw yesterday. EXAM: DG ABDOMEN ACUTE W/ 1V CHEST COMPARISON:  July 03, 2015 FINDINGS: There is no evidence of dilated bowel loops or free intraperitoneal air. There is a radiopaque screw projected in the lower left abdomen. A second triangular-shaped radiopaque density is projected in the pelvis. There is a third cylinder like radiopaque density projected in the mid lower abdomen. Heart size and mediastinal contours are within normal limits. Both lungs are clear. IMPRESSION: Three radiopaque foreign bodies projected in the lower abdomen and pelvis. There is no free air. Electronically Signed   By: Abelardo Diesel M.D.   On: 07/30/2015 19:56    EKG: Orders placed or performed during the hospital encounter of 06/13/15  . EKG 12-Lead  . EKG 12-Lead  . ED EKG within 10 minutes  . ED EKG within 10 minutes  . EKG    ASSESSMENT  Principal Problem:   Foreign body ingestion Active Problems:   Hypothyroidism   Hypertension   Constipation   Schizoaffective disorder, bipolar type ([place])  Foreign body alimentary tract   Suicidal ideation  PLAN   1). Foreign body ingestion with Abdominal pain - Foreign body have been visualized on CT scan. They include a nail, a battery and an earring. We'll continue serial abdominal x-rays until patient passes foreign bodies in stool. - If abdominal pain worsens, a stat abdominal x-ray will be obtained to examining for free air under diaphragm. Surgery will be consulted if necessary. - Continue suicidal precautions -  Use lowest dose of morphine for abdominal pain.  2). Schizoaffective disorder with multiple psychiatric issues as well as suicidal ideation/attempt - Continue involuntary commitment. Psychiatry shouldcomanage. - Continue subside precautions  3). Hypertension - Controlled, continue amlodipine  4). Hypothyroidism - Continue levothyroxine and check TSH and T4  5). Constipation - Continue MiraLAX  All the records are reviewed and case discussed with ED provider. Management plans discussed with the patient, family and they are in agreement.  CODE STATUS: Code Status History    Date Active Date Inactive Code Status Order ID Comments User Context   06/15/2015 11:33 PM 06/19/2015  6:49 PM Full Code [id number]  [name], MD Inpatient   12/05/2014  2:50 PM 12/09/2014  6:32 PM Full Code [id number]  [name], MD Inpatient   11/27/2014  8:18 PM 11/28/2014  8:43 PM Full Code [id number]  Hillary Bow, MD ED   11/06/2014 12:26 AM 11/10/2014  7:18 PM Full Code [id number]  [name], MD Inpatient   09/30/2014  1:23 PM 10/06/2014  2:06 PM Full Code [id number]  [name], MD Inpatient      I have independently reviewed all EKG and chest x-ray data  VTE prophylaxis: Lovenox if no contraindications and patient at low risk for bleeding. SCD's and progressive ambulation if patient has contraindications to anticoagulants. No DVT prophylaxis if patient presently receiving therapeutic anticoagulation or is at significant risk of bleeding for which the risk of anticoagulation outweigh the potential benefits.  Vaccinations: Pneumonia & flu vaccine per hospital protocol Prevention: Will proceed with conservative measures for the prevention of delirium in patients older than 65. Fall precautions and 1:1 sitter as needed per hospital protocol.  TOTAL TIME TAKING CARE OF THIS PATIENT: 40 minutes.    Sylvan Cheese M.D on 08/01/2015 at 12:14 AM  Between 7am to 6pm - Pager -  [phone]  After 6pm go to [email or web address] - password [email or web address] [facility]  Office  [phone]  CC: Primary care physician; Casilda Carls, MD

## 2015-08-01 NOTE — Clinical Social Work Note (Signed)
Clinical Social Work Assessment  Patient Details  [name] MRN: [id number] Date of Birth: 01/18/1991  Date of referral:  08/01/15               Reason for consult:  Care Management Concerns, Discharge Planning                Permission sought to share information with:  Facility Sport and exercise psychologist, Guardian, Family Supports Permission granted to share information::  Yes, Verbal Permission Granted  Name::     Allene Pyo [phone], [name] Mother [phone]  Agency::  [facility] [phone]  Relationship::  yes  Contact Information:  yes  Housing/Transportation Living arrangements for the past 2 months:  [place] of Information:  Patient Patient Interpreter Needed:  None Criminal Activity/Legal Involvement Pertinent to Current Situation/Hospitalization:  No - Comment as needed Significant Relationships:  Parents, Mental Health Provider Lives with:  Facility Resident Do you feel safe going back to the place where you live?  Yes Need for family participation in patient care:  Yes (Comment)  Care giving concerns:  None discussed with at this time   Social Worker assessment / plan:  LCSW met with patient. Called Ms Oswaldo Milian [phone] Patient has been living in this group home for 5 years. Ms Oswaldo Milian will  not be available until Tuesday March 21st,2017  To pick up patient when she is medically cleared.Patient deliberately swallows objects to gain access to hospital and staff. She reports to staff that she gets to drink cola and eat cheeseburgers from the menu. She is mobile,eats and dresses and showers herself with prompting.Oriented to person and place,situation. Patient has a lengthy psychiatric history and behavioral issues. LCSW was informed Michail Sermon [phone] will commence next week for her day program.  Employment status:  Disabled (Comment on whether or not currently receiving Disability) Insurance information:   [place] PT Recommendations:  Not assessed at this time Information / Referral to community resources:   Patient is supported by [employer]  Patient/Family's Response to care: She will do this to get attention and be placed in hospital  Patient/Family's Understanding of and Emotional Response to Diagnosis, Current Treatment, and Prognosis:  Yes  Emotional Assessment Appearance:  Appears stated age Attitude/Demeanor/Rapport:  Unable to Assess Affect (typically observed):  Calm, Pleasant, Accepting Orientation:  Oriented to Self, Oriented to Place, Oriented to  Time, Oriented to Situation Alcohol / Substance use:  Never Used Psych involvement (Current and /or in the community):  Yes (Comment)  Discharge Needs  Concerns to be addressed:  Cognitive Concerns, Home Safety Concerns, Discharge Planning Concerns, Care Coordination Readmission within the last 30 days:  Yes Current discharge risk:  Cognitively Impaired Barriers to Discharge:  Unsafe home situation   Joana Reamer, LCSW 08/01/2015, 1:54 PM

## 2015-08-01 NOTE — ED Notes (Signed)
Called floor to let them know pt on the way up 

## 2015-08-02 LAB — THYROID PANEL WITH TSH
FREE THYROXINE INDEX: 2.6 (ref 1.2–4.9)
T3 Uptake Ratio: 31 % (ref 24–39)
T4, Total: 8.3 ug/dL (ref 4.5–12.0)
TSH: 0.394 u[IU]/mL — AB (ref 0.450–4.500)

## 2015-08-02 MED ORDER — MAGNESIUM CITRATE PO SOLN
1.0000 | Freq: Once | ORAL | Status: AC
  Administered 2015-08-02: 1 via ORAL
  Filled 2015-08-02: qty 296

## 2015-08-02 NOTE — Progress Notes (Signed)
Patient ID: Melanie Bell, female   DOB: [birth year], 25 y.o.   MRN: [name] [facility] PROGRESS NOTE  Melanie Bell [id number] DOB: 14-Jan-1991 DOA: 07/30/2015 PCP: Casilda Carls, MD  HPI/Subjective: Patient with abdominal pain. 5 out of 10 intensity in the left lower quadrant. No nausea or vomiting. Normally she has a bowel movement once a week.  Objective: Filed Vitals:   08/02/15 0537 08/02/15 1015  BP: 127/78 119/71  Pulse: 62 79  Temp: 98.1 F (36.7 C)   Resp: 18     Filed Weights   07/30/15 1833 08/01/15 0219  Weight: 87.998 kg (194 lb) 87.136 kg (192 lb 1.6 oz)    ROS: Review of Systems  Constitutional: Negative for fever and chills.  Eyes: Negative for blurred vision.  Respiratory: Negative for cough and shortness of breath.   Cardiovascular: Negative for chest pain.  Gastrointestinal: Positive for abdominal pain and constipation. Negative for nausea, vomiting and diarrhea.  Genitourinary: Negative for dysuria.  Musculoskeletal: Negative for joint pain.  Neurological: Negative for dizziness and headaches.   Exam: Physical Exam  HENT:  Nose: No mucosal edema.  Mouth/Throat: No oropharyngeal exudate or posterior oropharyngeal edema.  Eyes: Conjunctivae, EOM and lids are normal. Pupils are equal, round, and reactive to light.  Neck: No JVD present. Carotid bruit is not present. No edema present. No thyroid mass and no thyromegaly present.  Cardiovascular: S1 normal and S2 normal.  Exam reveals no gallop.   No murmur heard. Pulses:      Dorsalis pedis pulses are 2+ on the right side, and 2+ on the left side.  Respiratory: No respiratory distress. She has no wheezes. She has no rhonchi. She has no rales.  GI: Soft. Bowel sounds are normal. There is tenderness in the left lower quadrant.  Musculoskeletal:       Right ankle: She exhibits no swelling.       Left ankle: She exhibits no swelling.  Lymphadenopathy:    She has no cervical  adenopathy.  Neurological: She is alert. No cranial nerve deficit.  Skin: Skin is warm. No rash noted. Nails show no clubbing.  Psychiatric: Her affect is blunt.      Data Reviewed: Basic Metabolic Panel:  Recent Labs Lab 07/30/15 1838 08/01/15 0612  NA 140 141  K 4.2 3.6  CL 111 113*  CO2 24 24  GLUCOSE 64* 95  BUN 8 10  CREATININE 0.81 0.55  CALCIUM 9.5 9.5   Liver Function Tests:  Recent Labs Lab 07/30/15 1838  AST 17  ALT 8*  ALKPHOS 90  BILITOT 0.3  PROT 7.7  ALBUMIN 4.4   CBC:  Recent Labs Lab 07/30/15 1838 08/01/15 0612  WBC 7.8 10.4  HGB 13.1 13.4  HCT 39.0 38.9  MCV 91.9 91.4  PLT 209 166     Recent Results (from the past 240 hour(s))  MRSA PCR Screening     Status: None   Collection Time: 08/01/15  3:25 AM  Result Value Ref Range Status   MRSA by PCR NEGATIVE NEGATIVE Final    Comment:        The GeneXpert MRSA Assay (FDA approved for NASAL specimens only), is one component of a comprehensive MRSA colonization surveillance program. It is not intended to diagnose MRSA infection nor to guide or monitor treatment for MRSA infections.      Studies: Dg Chest 1 View  08/01/2015  CLINICAL DATA:  Foreign body ingestion EXAM: CHEST 1 VIEW COMPARISON:  3167 chest radiograph. FINDINGS: There are clustered linear metallic density fragments measuring approximately 4 cm in length overlying the lower chest near the midline, unchanged back to at least 09/12/2010. Stable cardiomediastinal silhouette with normal heart size. No pneumothorax. No pleural effusion. Lungs appear clear, with no acute consolidative airspace disease and no pulmonary edema. Partially visualized is a metallic density cylindrical structure in the left upper quadrant of the abdomen, likely within the splenic flexure of the colon. No evidence of pneumoperitoneum in the visualized upper abdomen. IMPRESSION: 1. No active cardiopulmonary disease. 2. Metallic density cylindrical structure  partially visualized in the left upper quadrant of the abdomen, likely within the splenic flexure of the colon. 3. Stable chronic linear metallic density is overlying the lower chest near the midline, unchanged since at least 2012. Electronically Signed   By: Ilona Sorrel M.D.   On: 08/01/2015 09:27   Dg Abd 1 View  07/31/2015  CLINICAL DATA:  Follow-up location of foreign bodies within the abdomen. Subsequent encounter. EXAM: ABDOMEN - 1 VIEW COMPARISON:  Abdominal radiograph performed earlier today at 7:39 p.m. FINDINGS: Two radiopaque foreign bodies are seen, though the pelvis is not imaged on this study. The tubular metal structure is noted overlying the left mid abdomen, while the nail is noted overlying the right lower abdomen. Their locations are not well assessed on radiograph. The triangular metal structure, if still present, is likely outside of the field of view. No definite free intra-abdominal air is seen on the provided upright view. The visualized lung bases are grossly clear. No acute osseous abnormalities are identified. IMPRESSION: Two radiopaque foreign bodies noted. The pelvis is not imaged on this study. The positions of these foreign bodies not well assessed on radiograph, though they have likely progressed mildly since the prior study. The triangular metal structure is likely outside of the field of view. No free intra-abdominal air seen. Electronically Signed   By: Garald Balding M.D.   On: 07/31/2015 21:07   Dg Abd 1 View  07/31/2015  CLINICAL DATA:  Ingested foreign bodies EXAM: ABDOMEN - 1 VIEW COMPARISON:  Abdominal radiographs July 30, 2015; CT abdomen and pelvis obtained earlier today FINDINGS: There are 3 metallic foreign bodies. A a metallic screw is noted in the right mid abdomen. A cylindrical metallic foreign body is located in the left mid abdomen. A metallic triangular-shaped structure is located in the right lower quadrant. These devices have moved only slightly since  earlier today. No free air is evident on this supine examination. No bowel obstruction. Diffuse stool throughout colon. Urinary bladder is distended with contrast. IMPRESSION: There are 3 metallic foreign bodies in the lower abdomen and pelvis, little changed from earlier in the day. The cylindrical metallic foreign body is in the mid transverse colon. The screw is in the mid ileum region. The triangular-shaped metallic foreign body is probably within the sigmoid colon; its location is more difficult to assess precisely. No demonstrable bowel obstruction or free air is evident on this supine examination. Diffuse stool is noted throughout the colon. Electronically Signed   By: Lowella Grip III M.D.   On: 07/31/2015 20:01   Ct Abdomen Pelvis W Contrast  07/31/2015  CLINICAL DATA:  Abdominal pain and ingested multiple metallic objects. EXAM: CT ABDOMEN AND PELVIS WITH CONTRAST TECHNIQUE: Multidetector CT imaging of the abdomen and pelvis was performed using the standard protocol following bolus administration of intravenous contrast. CONTRAST:  161mL OMNIPAQUE IOHEXOL 300 MG/ML  SOLN COMPARISON:  Abdominal radiograph  07/30/2015 and CT 08/17/2014 FINDINGS: Lower chest:  Lung bases are clear.  No pleural effusions. Hepatobiliary: Normal appearance of the liver and gallbladder. Pancreas: Normal appearance of the pancreas without inflammation or duct dilatation. Spleen: Normal appearance of spleen without enlargement. Adrenals/Urinary Tract: Normal adrenal glands. Normal appearance of the kidneys without stones or hydronephrosis. Normal appearance the urinary bladder. Stomach/Bowel: Again noted are 3 metallic structures within the bowel. These 3 metallic structures were present on the recent abdominal radiograph from 07/30/2015. The cylinder shaped structure is within the left transverse colon. This cylinder shaped structure has moved towards the left compared to the recent comparison examination. There is a  triangular-shaped metallic structure in the mid lower abdomen which has minimally changed in location. This could be within colon but difficult to evaluate the exact location due to the streak artifact. There is a metallic screw within the small bowel in the right abdomen. This screw was on the left side of the abdomen on 07/30/2015. Large amount of stool throughout the colon. No evidence for a bowel obstruction. No significant bowel wall thickening. There is oral contrast in small bowel and the right colon. Vascular/Lymphatic: No significant lymphadenopathy. Normal appearance of the vasculature. Portal venous system is patent. Reproductive: Normal appearance of the uterus and adnexal structures. Other: No evidence for free air or free fluid. 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 IMPRESSION: There are three ingested foreign bodies within the bowel. Two of these foreign bodies have changed location from the exam on 07/30/2015. There is no evidence for a bowel obstruction or bowel perforation. No significant bowel inflammation. Large amount of stool in the colon. Electronically Signed   By: Markus Daft M.D.   On: 07/31/2015 16:40    Scheduled Meds: . amLODipine  2.5 mg Oral Daily  . benztropine  1 mg Oral QHS  . docusate sodium  200 mg Oral BID  . enoxaparin (LOVENOX) injection  40 mg Subcutaneous Q24H  . fluPHENAZine  5 mg Oral 3 times per day  . lactulose  30 g Oral BID  . levothyroxine  75 mcg Oral QAC breakfast  . lurasidone  120 mg Oral Q breakfast  . magnesium citrate  1 Bottle Oral Once  . Oxcarbazepine  150 mg Oral BID  . polyethylene glycol  17 g Oral Daily  . senna  2 tablet Oral QHS  . traZODone  100 mg Oral QHS  . venlafaxine XR  150 mg Oral Q breakfast   Continuous Infusions: . sodium chloride 75 mL/hr at 08/02/15 1210    Assessment/Plan:  1. Abdominal pain, constipation, swallowing  foreign bodies including an earring, a battery. Because the battery is not in the stomach, we can watch it at this point. No good bowel movement yet with the lactulose started yesterday and the MiraLAX. I will add mag citrate. 2. Essential hypertension. Patient is on amlodipine. This may be contributing to the patient's constipation. 3. Hypothyroidism unspecified continue levothyroxine 4. Schizoaffective disease versus bipolar depression. Psychiatry following in continuing her usual medications.  Code Status:     Code Status Orders        Start     Ordered   08/01/15 0218  Full code   Continuous     08/01/15 0217    Code Status History    Date Active Date Inactive Code Status Order ID Comments User Context   06/15/2015 11:33 PM 06/19/2015  6:49 PM Full  Code [id number]  [name], MD Inpatient   12/05/2014  2:50 PM 12/09/2014  6:32 PM Full Code [id number]  [name], MD Inpatient   11/27/2014  8:18 PM 11/28/2014  8:43 PM Full Code [id number]  Hillary Bow, MD ED   11/06/2014 12:26 AM 11/10/2014  7:18 PM Full Code [id number]  [name], MD Inpatient   09/30/2014  1:23 PM 10/06/2014  2:06 PM Full Code [id number]  [name], MD Inpatient     Disposition Plan: To the psychiatry floor once she has bowel movements  Consultants:  Psychiatry  Time spent: 20 minutes  Loletha Grayer  [facility]

## 2015-08-03 MED ORDER — LURASIDONE HCL 40 MG PO TABS
120.0000 mg | ORAL_TABLET | Freq: Every day | ORAL | Status: DC
  Administered 2015-08-03 – 2015-08-05 (×3): 120 mg via ORAL
  Filled 2015-08-03 (×2): qty 3

## 2015-08-03 NOTE — Progress Notes (Addendum)
Patient ID: Melanie Bell, female   DOB: [birth year], 25 y.o.   MRN: [id number] [facility] PROGRESS NOTE  Melanie Bell [id number] DOB: 01-23-1991 DOA: 07/30/2015 PCP: Casilda Carls, MD  HPI/Subjective: Had BM y'day and also this am but denies passing foreign body. Sitter at bedside.  Objective: Filed Vitals:   08/03/15 0500 08/03/15 1531  BP: 120/70 108/66  Pulse: 76 77  Temp: 98.5 F (36.9 C) 98.8 F (37.1 C)  Resp: 18 20    Filed Weights   07/30/15 1833 08/01/15 0219  Weight: 87.998 kg (194 lb) 87.136 kg (192 lb 1.6 oz)    ROS: Review of Systems  Constitutional: Negative for fever and chills.  Eyes: Negative for blurred vision.  Respiratory: Negative for cough and shortness of breath.   Cardiovascular: Negative for chest pain.  Gastrointestinal: Negative for nausea, vomiting, abdominal pain, diarrhea and constipation.  Genitourinary: Negative for dysuria.  Musculoskeletal: Negative for joint pain.  Neurological: Negative for dizziness and headaches.   Exam: Physical Exam  HENT:  Nose: No mucosal edema.  Mouth/Throat: No oropharyngeal exudate or posterior oropharyngeal edema.  Eyes: Conjunctivae, EOM and lids are normal. Pupils are equal, round, and reactive to light.  Neck: No JVD present. Carotid bruit is not present. No edema present. No thyroid mass and no thyromegaly present.  Cardiovascular: S1 normal and S2 normal.  Exam reveals no gallop.   No murmur heard. Pulses:      Dorsalis pedis pulses are 2+ on the right side, and 2+ on the left side.  Respiratory: No respiratory distress. She has no wheezes. She has no rhonchi. She has no rales.  GI: Soft. Bowel sounds are normal. There is no tenderness.  Musculoskeletal:       Right ankle: She exhibits no swelling.       Left ankle: She exhibits no swelling.  Lymphadenopathy:    She has no cervical adenopathy.  Neurological: She is alert. No cranial nerve deficit.  Skin: Skin is warm. No  rash noted. Nails show no clubbing.  Psychiatric: Her affect is blunt.      Data Reviewed: Basic Metabolic Panel:  Recent Labs Lab 07/30/15 1838 08/01/15 0612  NA 140 141  K 4.2 3.6  CL 111 113*  CO2 24 24  GLUCOSE 64* 95  BUN 8 10  CREATININE 0.81 0.55  CALCIUM 9.5 9.5   Liver Function Tests:  Recent Labs Lab 07/30/15 1838  AST 17  ALT 8*  ALKPHOS 90  BILITOT 0.3  PROT 7.7  ALBUMIN 4.4   CBC:  Recent Labs Lab 07/30/15 1838 08/01/15 0612  WBC 7.8 10.4  HGB 13.1 13.4  HCT 39.0 38.9  MCV 91.9 91.4  PLT 209 166     Recent Results (from the past 240 hour(s))  MRSA PCR Screening     Status: None   Collection Time: 08/01/15  3:25 AM  Result Value Ref Range Status   MRSA by PCR NEGATIVE NEGATIVE Final    Comment:        The GeneXpert MRSA Assay (FDA approved for NASAL specimens only), is one component of a comprehensive MRSA colonization surveillance program. It is not intended to diagnose MRSA infection nor to guide or monitor treatment for MRSA infections.      Studies: No results found.  Scheduled Meds: . amLODipine  2.5 mg Oral Daily  . benztropine  1 mg Oral QHS  . docusate sodium  200 mg Oral BID  . enoxaparin (LOVENOX) injection  40 mg Subcutaneous Q24H  . fluPHENAZine  5 mg Oral 3 times per day  . lactulose  30 g Oral BID  . levothyroxine  75 mcg Oral QAC breakfast  . lurasidone  120 mg Oral Q breakfast  . Oxcarbazepine  150 mg Oral BID  . polyethylene glycol  17 g Oral Daily  . senna  2 tablet Oral QHS  . traZODone  100 mg Oral QHS  . venlafaxine XR  150 mg Oral Q breakfast   Continuous Infusions: . sodium chloride 75 mL/hr at 08/03/15 0207    Assessment/Plan:  1. Abdominal pain, constipation, swallowing foreign bodies including an earring, a battery. Because the battery is not in the stomach, we can watch it at this point. having good bowel movements with the lactulose and the MiraLAX. Recheck Abd x-ray to check for  FB 2. Essential hypertension. Patient is on amlodipine. This may be contributing to the patient's constipation. 3. Hypothyroidism unspecified continue levothyroxine 4. Schizoaffective disease versus bipolar depression. Psychiatry following in continuing her usual medications.  Code Status: Full Code Disposition Plan: Inpt psych  Consultants:  Psychiatry  Time spent: 20 minutes  [facility], [place] [facility]

## 2015-08-03 NOTE — Consult Note (Signed)
[place] [facility] Consult   Reason for Consult:  Consult for this 25 year old woman with schizoaffective disorder history of depression and history of multiple foreign body ingestions comes back to the emergency room after swallowing more foreign objects and also having suicidal thoughts and hallucinations Referring Physician:  Jimmye Norman Patient Identification: Melanie Bell MRN:  [id number] Principal Diagnosis: Foreign body ingestion Diagnosis:   Patient Active Problem List   Diagnosis Date Noted  . Foreign body ingestion [T18.9XXA] 07/31/2015  . 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 Schizoaffective disorder, bipolar type ([place]) [F25.0] 11/06/2014  . Tardive dyskinesia [G24.01] 10/06/2014  . Tobacco use disorder [F17.200] 09/30/2014  . Hypothyroidism [E03.9] 09/29/2014  . Hypertension [I10] 09/29/2014  . Constipation [K59.00] 09/29/2014    Total Time spent with patient: 1 hour  Subjective:   Melanie Bell is a 25 y.o. female patient admitted with "I'm really depressed".  Update as of Monday, March 20. Patient was admitted to the medical/surgical service from the emergency room for monitoring of her foreign body ingestion. Patient seen in consultation. Patient interviewed. Chart reviewed older consults reviewed. Patient tells me that her mood is feeling better. She is still depressed but has no acute suicidal ideation or intent to harm herself. She still has hallucinations but can't tell what they are saying which is an improvement. She is still able to articulate the recent stresses she had that upset her. She has basically been cooperative with treatment here in the medical service.  HPI:  Patient interviewed. Chart reviewed current labs reviewed including the x-ray. Case discussed with TTS and emergency room doctor. 25 year old woman with a history of schizoaffective disorder well known to [name] comes into the hospital after swallowing some more inanimate  objects. She says that she swallowed a AA battery about a week ago and swallowed a screw the day before yesterday. She told the staff about it yesterday. They brought her to the emergency room. She says she was doing okay until the day before yesterday when suddenly her auditory hallucinations came back full force. She is still hearing voices today. The voices tell her to kill herself. She has active suicidal thoughts. Her mood is very sad. Additionally she is complaining of abdominal pain and says she hasn't had a bowel movement in about a week. Not abusing substances. Acute stress that she can identify is that she spoke to her mother the day before yesterday and the mother reminded her that it was the anniversary of the death of a family friend a few years ago.  Social history: Patient lives in a group home. I believe mother is guardian but mother now lives not only out of state that in [place]. Ever since this happened the patient's been decompensating. Patient lives in this group home and frequently is acting out by swallowing objects.  Medical history: Long history of swallowing inanimate objects. She has needed surgery in the past at times for it. Currently she has 3 objects in her elementary canal, the battery the screw and an earring that still has not passed through. Also has a history of hypothyroidism and high blood pressure.  Substance abuse history: Does not drink does not abuse drugs.  Past Psychiatric History: Long history of depression with psychotic symptoms probably schizoaffective versus recurrent bipolar depression. Hasn't act team and is on antipsychotics and mood stabilizers with only partial benefit. As mentioned above her typical presentation is too of swallowed some kind of inanimate object often things that would be potentially  harmful. She has needed multiple hospitalizations over the years. She seems to of been decompensating and has had more hospitalizations and presentations to  the ER in the last few months than normal.  Risk to Self: Suicidal Ideation: No Suicidal Intent: No Is patient at risk for suicide?: Yes (attempt pta) Suicidal Plan?: No Access to Means: Yes Specify Access to Suicidal Means: Pt has access to objects in the group home What has been your use of drugs/alcohol within the last 12 months?: None reported How many times?: 11 Other Self Harm Risks: History of cutting, swallowing objects Triggers for Past Attempts: Hallucinations, Family contact, Other personal contacts Intentional Self Injurious Behavior: Cutting Comment - Self Injurious Behavior: Pt has history of cutting but hasn't done so in the past 5 years. Risk to Others: Homicidal Ideation: No Thoughts of Harm to Others: No Current Homicidal Intent: No Current Homicidal Plan: No Access to Homicidal Means: No Identified Victim: None reported History of harm to others?: No Assessment of Violence: None Noted Violent Behavior Description: None reported Does patient have access to weapons?: No Criminal Charges Pending?: No Does patient have a court date: No Prior Inpatient Therapy: Prior Inpatient Therapy: Yes Prior Therapy Dates: 07/16, 06/16, 04/16, 04/15, 08/14, 1/17 Prior Therapy Facilty/Provider(s): [facility], [place], [place] Reason for Treatment: Schizophrenia Prior Outpatient Therapy: Prior Outpatient Therapy: Yes Prior Therapy Dates: Current' Prior Therapy Facilty/Provider(s): [facility] Reason for Treatment: Schizophrenia  Does patient have an ACCT team?: No Does patient have Intensive In-House Services?  : No Does patient have [facility] services? : No Does patient have P4CC services?: No  Past Medical History:  Past Medical History  Diagnosis Date  . Hallucinations 09/30/2014    Sizoaffective  . Depression   . Anxiety   . Hypertension   . Tardive dyskinesia 10/2014    recent onset  . GERD (gastroesophageal reflux disease)   . Hyperlipidemia     Past  Surgical History  Procedure Laterality Date  . Wisdom tooth extraction    . Esophagogastroduodenoscopy N/A 11/28/2014    Procedure: ESOPHAGOGASTRODUODENOSCOPY (EGD);  Surgeon: [name], MD;  Location: [facility] ENDOSCOPY;  Service: Endoscopy;  Laterality: N/A;  . Abdominal surgery      "years ago" to remove foreign objects  . Breast lumpectomy     Family History:  Family History  Problem Relation Age of Onset  . Depression Mother   . Hypertension Mother    Family Psychiatric  History: Positive family history for depression possible schizoaffective disorder. Social History:  History  Alcohol Use No     History  Drug Use No    Social History   Social History  . Marital Status: Single    Spouse Name: N/A  . Number of Children: N/A  . Years of Education: N/A   Social History Main Topics  . Smoking status: Current Every Day Smoker -- 0.50 packs/day for 3 years    Types: Cigarettes  . Smokeless tobacco: Never Used  . Alcohol Use: No  . Drug Use: No  . Sexual Activity: No   Other Topics Concern  . None   Social History Narrative   Additional Social History:    Allergies:   Allergies  Allergen Reactions  . Betadine [Povidone Iodine] Other (See Comments)    Reaction:  Unknown   . Shellfish-Derived Products Other (See Comments)    Reaction:  Unknown   . Iodine Rash    Labs:  No results found for this or any previous  visit (from the past 48 hour(s)).  Current Facility-Administered Medications  Medication Dose Route Frequency Provider Last Rate Last Dose  . 0.9 %  sodium chloride infusion   Intravenous Continuous Loletha Grayer, MD 75 mL/hr at 08/03/15 1601    . amLODipine (NORVASC) tablet 2.5 mg  2.5 mg Oral Daily [name], MD   2.5 mg at 08/03/15 0945  . benztropine (COGENTIN) tablet 1 mg  1 mg Oral QHS [name], MD   1 mg at 08/02/15 2220  . docusate sodium (COLACE) capsule 200 mg  200 mg Oral BID [name], MD   200 mg at 08/03/15 0946  .  enoxaparin (LOVENOX) injection 40 mg  40 mg Subcutaneous Q24H Sylvan Cheese, MD   40 mg at 08/02/15 2220  . fluPHENAZine (PROLIXIN) tablet 5 mg  5 mg Oral 3 times per day [name], MD   5 mg at 08/03/15 1314  . HYDROcodone-acetaminophen (NORCO/VICODIN) 5-325 MG per tablet 1 tablet  1 tablet Oral Q6H PRN Loletha Grayer, MD   1 tablet at 08/01/15 2312  . lactulose (CHRONULAC) 10 GM/15ML solution 30 g  30 g Oral BID Loletha Grayer, MD   30 g at 08/03/15 0946  . levothyroxine (SYNTHROID, LEVOTHROID) tablet 75 mcg  75 mcg Oral QAC breakfast [name], MD   75 mcg at 08/03/15 0946  . lurasidone (LATUDA) tablet 120 mg  120 mg Oral Q breakfast [name], MD   120 mg at 08/03/15 0946  . OXcarbazepine (TRILEPTAL) tablet 150 mg  150 mg Oral BID [name], MD   150 mg at 08/03/15 0946  . polyethylene glycol (MIRALAX / GLYCOLAX) packet 17 g  17 g Oral Daily Earleen Newport, MD   17 g at 08/03/15 0945  . senna (SENOKOT) tablet 17.2 mg  2 tablet Oral QHS Loletha Grayer, MD   17.2 mg at 08/02/15 2222  . traZODone (DESYREL) tablet 100 mg  100 mg Oral QHS [name], MD   100 mg at 08/02/15 2221  . venlafaxine XR (EFFEXOR-XR) 24 hr capsule 150 mg  150 mg Oral Q breakfast [name], MD   150 mg at 08/03/15 [id number]    Musculoskeletal: Strength & Muscle Tone: within normal limits Gait & Station: normal Patient leans: N/A  Psychiatric Specialty Exam: Review of Systems  Constitutional: Negative.   HENT: Negative.   Eyes: Negative.   Respiratory: Negative.   Cardiovascular: Negative.   Gastrointestinal: Positive for abdominal pain and constipation.  Musculoskeletal: Negative.   Skin: Negative.   Neurological: Negative.   Psychiatric/Behavioral: Positive for depression and hallucinations. Negative for suicidal ideas, memory loss and substance abuse. The patient has insomnia. The patient is not nervous/anxious.     Blood pressure 108/66, pulse 77, temperature 98.8 F (37.1  C), temperature source Oral, resp. rate 20, height 5\' 6"  (1.676 m), weight 87.136 kg (192 lb 1.6 oz), SpO2 98 %.Body mass index is 31.02 kg/(m^2).  General Appearance: Casual  Eye Contact::  Good  Speech:  Slow  Volume:  Decreased  Mood:  Depressed and Dysphoric  Affect:  Depressed and Tearful  Thought Process:  Goal Directed  Orientation:  Full (Time, Place, and Person)  Thought Content:  Hallucinations: Auditory Command:  She says they tell her to hurt her self  Suicidal Thoughts:  No  Homicidal Thoughts:  No  Memory:  Immediate;   Good Recent;   Fair Remote;   Fair  Judgement:  Poor  Insight:  Fair  Psychomotor Activity:  Psychomotor Retardation  Concentration:  Fair  Recall:  [place] of Knowledge:Fair  Language: Fair  Akathisia:  No  Handed:  Right  AIMS (if indicated):     Assets:  Communication Skills Desire for Improvement Financial Resources/Insurance Housing Social Support  ADL's:  Intact  Cognition: WNL  Sleep:      Treatment Plan Summary: Daily contact with patient to assess and evaluate symptoms and progress in treatment, Medication management and Plan Patient admitted to the medical or surgical service. Treatment plan appears to be watchful waiting for passing of the foreign bodies that she swallowed. Patient still having some abdominal pain but improved. Her mood is also improved. I will continue to check in with her daily. No recommended change to her medication. Supportive counseling completed. We discussed things that will be helpful for her once she gets out of the hospital with going to some kind of day program probably being at the top of the list. No change to diagnosis. patient is currently denying any suicidal ideation and intention or plan. Continue the IVC in the sitter for now however as she is somewhat unpredictable.  Disposition: Recommend psychiatric Inpatient admission when medically cleared. Supportive therapy provided about ongoing  stressors.  Alethia Berthold, MD 08/03/2015 5:24 PM

## 2015-08-03 NOTE — Progress Notes (Signed)
   08/03/15 1400  Clinical Encounter Type  Visited With Patient  Visit Type Initial  Consult/Referral To Chaplain   Chaplain rounded on unit and visited with patient and offered pastoral care.   [place] [phone]

## 2015-08-04 ENCOUNTER — Inpatient Hospital Stay: Payer: Medicaid Other

## 2015-08-04 LAB — CREATININE, SERUM
Creatinine, Ser: 0.76 mg/dL (ref 0.44–1.00)
GFR calc non Af Amer: >60 mL/min (ref >60)

## 2015-08-04 IMAGING — DX DG ABDOMEN 1V
1 series · 1 of 1 positions shown · non-contrast
Comparison: [DATE] abdominal radiograph.

CLINICAL DATA: Follow-up foreign body ingestion is.

EXAM:
ABDOMEN - 1 VIEW

[abdomen kub]
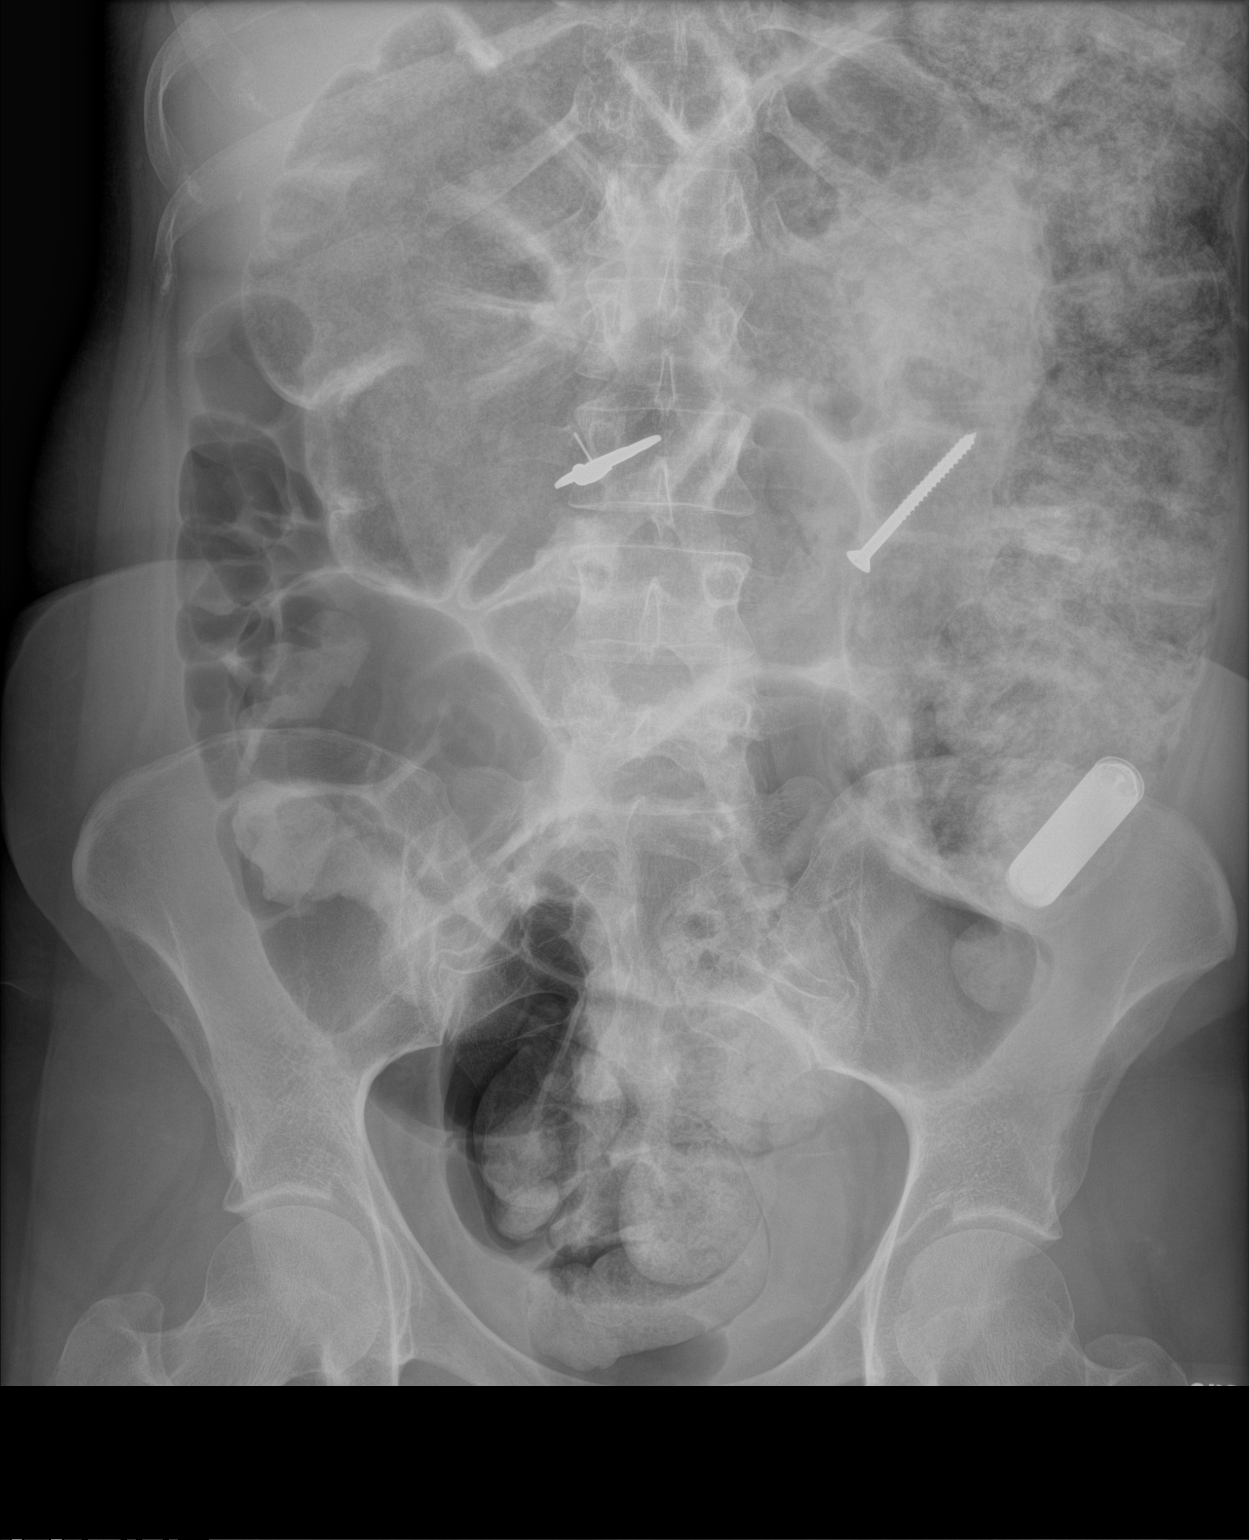

[1 of 1 positions shown; findings below may reference images not displayed]

FINDINGS: Metallic 4.8 cm cylindrical foreign body in the left lower quadrant
of the abdomen is likely within the distal colon near the junction
of the descending and sigmoid colon, with probable antegrade
progression. Metallic 4.9 cm screw foreign body is in the left mid
abdomen, previously seen in the medial right mid abdomen, uncertain
in position. Separate 3.3 cm metallic foreign body overlies the L2
vertebral body, most recently overlying the upper sacrum, uncertain
in position. No appreciably dilated small bowel loops. Large volume
colonic stool. No evidence of pneumatosis or pneumoperitoneum.
IMPRESSION: 1. Metallic cylindrical foreign body in the left lower quadrant of
the abdomen, probably in the distal colon, with probable antegrade
progression.
2. Metallic screw foreign body in the left mid abdomen, previously
in the medial right mid abdomen, uncertain in position, probably in
the small bowel.
3. Separate metallic foreign body in the midline abdomen, previously
in the upper sacrum, uncertain in position.
4. No evidence of small-bowel obstruction or pneumoperitoneum.
5. Large volume colonic stool.

## 2015-08-04 NOTE — Progress Notes (Signed)
Patient ID: Melanie Bell, female   DOB: [birth year], 25 y.o.   MRN: [id number] [facility] PROGRESS NOTE  Melanie Bell [id number] DOB: 02/06/1991 DOA: 07/30/2015 PCP: Casilda Carls, MD  HPI/Subjective:  Having regular BMs, still not passed any foreign body. Not symptomatic  Objective: Filed Vitals:   08/04/15 0549 08/04/15 0720  BP: 109/68 120/66  Pulse: 74 73  Temp: 98.1 F (36.7 C) 98.3 F (36.8 C)  Resp: 16 18    Filed Weights   07/30/15 1833 08/01/15 0219  Weight: 87.998 kg (194 lb) 87.136 kg (192 lb 1.6 oz)    ROS: Review of Systems  Constitutional: Negative for fever and chills.  Eyes: Negative for blurred vision.  Respiratory: Negative for cough and shortness of breath.   Cardiovascular: Negative for chest pain.  Gastrointestinal: Negative for nausea, vomiting, abdominal pain, diarrhea and constipation.  Genitourinary: Negative for dysuria.  Musculoskeletal: Negative for joint pain.  Neurological: Negative for dizziness and headaches.   Exam: Physical Exam  HENT:  Nose: No mucosal edema.  Mouth/Throat: No oropharyngeal exudate or posterior oropharyngeal edema.  Eyes: Conjunctivae, EOM and lids are normal. Pupils are equal, round, and reactive to light.  Neck: No JVD present. Carotid bruit is not present. No edema present. No thyroid mass and no thyromegaly present.  Cardiovascular: S1 normal and S2 normal.  Exam reveals no gallop.   No murmur heard. Pulses:      Dorsalis pedis pulses are 2+ on the right side, and 2+ on the left side.  Respiratory: No respiratory distress. She has no wheezes. She has no rhonchi. She has no rales.  GI: Soft. Bowel sounds are normal. There is no tenderness.  Musculoskeletal:       Right ankle: She exhibits no swelling.       Left ankle: She exhibits no swelling.  Lymphadenopathy:    She has no cervical adenopathy.  Neurological: She is alert. No cranial nerve deficit.  Skin: Skin is warm. No rash  noted. Nails show no clubbing.  Psychiatric: Her affect is not blunt.      Data Reviewed: Basic Metabolic Panel:  Recent Labs Lab 07/30/15 1838 08/01/15 0612 08/04/15 0540  NA 140 141  --   K 4.2 3.6  --   CL 111 113*  --   CO2 24 24  --   GLUCOSE 64* 95  --   BUN 8 10  --   CREATININE 0.81 0.55 0.76  CALCIUM 9.5 9.5  --    Liver Function Tests:  Recent Labs Lab 07/30/15 1838  AST 17  ALT 8*  ALKPHOS 90  BILITOT 0.3  PROT 7.7  ALBUMIN 4.4   CBC:  Recent Labs Lab 07/30/15 1838 08/01/15 0612  WBC 7.8 10.4  HGB 13.1 13.4  HCT 39.0 38.9  MCV 91.9 91.4  PLT 209 166     Recent Results (from the past 240 hour(s))  MRSA PCR Screening     Status: None   Collection Time: 08/01/15  3:25 AM  Result Value Ref Range Status   MRSA by PCR NEGATIVE NEGATIVE Final    Comment:        The GeneXpert MRSA Assay (FDA approved for NASAL specimens only), is one component of a comprehensive MRSA colonization surveillance program. It is not intended to diagnose MRSA infection nor to guide or monitor treatment for MRSA infections.      Studies: Dg Abd 1 View  08/04/2015  CLINICAL DATA:  Follow-up foreign body  ingestion is. EXAM: ABDOMEN - 1 VIEW COMPARISON:  07/31/2015 abdominal radiograph. FINDINGS: Metallic 4.8 cm cylindrical foreign body in the left lower quadrant of the abdomen is likely within the distal colon near the junction of the descending and sigmoid colon, with probable antegrade progression. Metallic 4.9 cm screw foreign body is in the left mid abdomen, previously seen in the medial right mid abdomen, uncertain in position. Separate 3.3 cm metallic foreign body overlies the L2 vertebral body, most recently overlying the upper sacrum, uncertain in position. No appreciably dilated small bowel loops. Large volume colonic stool. No evidence of pneumatosis or pneumoperitoneum. IMPRESSION: 1. Metallic cylindrical foreign body in the left lower quadrant of the abdomen,  probably in the distal colon, with probable antegrade progression. 2. Metallic screw foreign body in the left mid abdomen, previously in the medial right mid abdomen, uncertain in position, probably in the small bowel. 3. Separate metallic foreign body in the midline abdomen, previously in the upper sacrum, uncertain in position. 4. No evidence of small-bowel obstruction or pneumoperitoneum. 5. Large volume colonic stool. Electronically Signed   By: Ilona Sorrel M.D.   On: 08/04/2015 08:51    Scheduled Meds: . amLODipine  2.5 mg Oral Daily  . benztropine  1 mg Oral QHS  . docusate sodium  200 mg Oral BID  . enoxaparin (LOVENOX) injection  40 mg Subcutaneous Q24H  . fluPHENAZine  5 mg Oral 3 times per day  . lactulose  30 g Oral BID  . levothyroxine  75 mcg Oral QAC breakfast  . lurasidone  120 mg Oral Q breakfast  . Oxcarbazepine  150 mg Oral BID  . polyethylene glycol  17 g Oral Daily  . senna  2 tablet Oral QHS  . traZODone  100 mg Oral QHS  . venlafaxine XR  150 mg Oral Q breakfast   Continuous Infusions: . sodium chloride 75 mL/hr at 08/04/15 0548    Assessment/Plan:  1. Abdominal pain, constipation, swallowing foreign bodies including an earring, a battery. Because the battery is not in the stomach, we can watch it at this point. having good bowel movements with the lactulose and the MiraLAX. Abd x-ray still showing FB as is but not blocking so no intervention needed per surgery. 2. Essential hypertension: controlled. 3. Hypothyroidism unspecified continue levothyroxine 4. Schizoaffective disease versus bipolar depression. Psychiatry following in continuing her usual medications.  She is medically clear for DC if psych planning on behavioral medicine transfer. D/w dr Weber Cooks.  Code Status: Full Code Disposition Plan: Inpt psych vs group home  Consultants:  Psychiatry  Time spent: 20 minutes  [facility], [place] [facility]

## 2015-08-04 NOTE — Progress Notes (Signed)
Pt had medium, semi formed brown Bm. Inspected  for presence of foreign body but none found.

## 2015-08-04 NOTE — Progress Notes (Addendum)
Pt to be discharged per MD order. Psych MD ok for pt to be sent to behavioral medicine. Called intake on the behavioral unit and was told they were on an "admissions freeze". We will call frequently to see if bed is available and will discharge when possible.   Per Anderson Malta the behavioral health charge nurse they would not be able to accept pt until tomorrow. Have began discharge process and will plan on sending pt down in am

## 2015-08-04 NOTE — Consult Note (Signed)
[facility] Consult   Reason for Consult:  Consult for this 25 year old woman with schizoaffective disorder history of depression and history of multiple foreign body ingestions comes back to the emergency room after swallowing more foreign objects and also having suicidal thoughts and hallucinations Referring Physician:  Jimmye Norman Patient Identification: Melanie Bell MRN:  [id number] Principal Diagnosis: Schizoaffective disorder Diagnosis:   Patient Active Problem List   Diagnosis Date Noted  . Foreign body ingestion [T18.9XXA] 07/31/2015  . 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 Schizoaffective disorder, bipolar type ([place]) [F25.0] 11/06/2014  . Tardive dyskinesia [G24.01] 10/06/2014  . Tobacco use disorder [F17.200] 09/30/2014  . Hypothyroidism [E03.9] 09/29/2014  . Hypertension [I10] 09/29/2014  . Constipation [K59.00] 09/29/2014    Total Time spent with patient: 30 minutes  Subjective:   Melanie Bell is a 25 y.o. female patient admitted with "I'm really depressed".  Update as of Tuesday, March 21. Spoke with hospitalist this morning. Medical and surgical service do not feel that she requires any surgical intervention and also find that she is currently medically stable. No further indication for constant observation on the medical unit. I spoke with the patient this afternoon. She states that her mood is still feeling down. She is still worried that if she goes back to her group home that she will likely end up swallowing something again. On the other hand she is not having any auditory hallucinations today. She has not been acting out and aggressive. Her affect is blunted but she is awake alert oriented and reasonably communicative. We discussed the pros and cons of going back to the group home versus going to the psychiatry ward and in the end decided that probably the wiser thing was at least temporary admission to psychiatry.  HPI:  Patient  interviewed. Chart reviewed current labs reviewed including the x-ray. Case discussed with TTS and emergency room doctor. 25 year old woman with a history of schizoaffective disorder well known to [name] comes into the hospital after swallowing some more inanimate objects. She says that she swallowed a AA battery about a week ago and swallowed a screw the day before yesterday. She told the staff about it yesterday. They brought her to the emergency room. She says she was doing okay until the day before yesterday when suddenly her auditory hallucinations came back full force. She is still hearing voices today. The voices tell her to kill herself. She has active suicidal thoughts. Her mood is very sad. Additionally she is complaining of abdominal pain and says she hasn't had a bowel movement in about a week. Not abusing substances. Acute stress that she can identify is that she spoke to her mother the day before yesterday and the mother reminded her that it was the anniversary of the death of a family friend a few years ago.  Social history: Patient lives in a group home. I believe mother is guardian but mother now lives not only out of state that in [place]. Ever since this happened the patient's been decompensating. Patient lives in this group home and frequently is acting out by swallowing objects.  Medical history: Long history of swallowing inanimate objects. She has needed surgery in the past at times for it. Currently she has 3 objects in her elementary canal, the battery the screw and an earring that still has not passed through. Also has a history of hypothyroidism and high blood pressure.  Substance abuse history: Does not drink does not abuse drugs.  Past Psychiatric  History: Long history of depression with psychotic symptoms probably schizoaffective versus recurrent bipolar depression. Hasn't act team and is on antipsychotics and mood stabilizers with only partial benefit. As mentioned above her typical  presentation is too of swallowed some kind of inanimate object often things that would be potentially harmful. She has needed multiple hospitalizations over the years. She seems to of been decompensating and has had more hospitalizations and presentations to the ER in the last few months than normal.  Risk to Self: Suicidal Ideation: No Suicidal Intent: No Is patient at risk for suicide?: Yes (attempt pta) Suicidal Plan?: No Access to Means: Yes Specify Access to Suicidal Means: Pt has access to objects in the group home What has been your use of drugs/alcohol within the last 12 months?: None reported How many times?: 11 Other Self Harm Risks: History of cutting, swallowing objects Triggers for Past Attempts: Hallucinations, Family contact, Other personal contacts Intentional Self Injurious Behavior: Cutting Comment - Self Injurious Behavior: Pt has history of cutting but hasn't done so in the past 5 years. Risk to Others: Homicidal Ideation: No Thoughts of Harm to Others: No Current Homicidal Intent: No Current Homicidal Plan: No Access to Homicidal Means: No Identified Victim: None reported History of harm to others?: No Assessment of Violence: None Noted Violent Behavior Description: None reported Does patient have access to weapons?: No Criminal Charges Pending?: No Does patient have a court date: No Prior Inpatient Therapy: Prior Inpatient Therapy: Yes Prior Therapy Dates: 07/16, 06/16, 04/16, 04/15, 08/14, 1/17 Prior Therapy Facilty/Provider(s): [facility], [place], [place] Reason for Treatment: Schizophrenia Prior Outpatient Therapy: Prior Outpatient Therapy: Yes Prior Therapy Dates: Current' Prior Therapy Facilty/Provider(s): [facility] Reason for Treatment: Schizophrenia  Does patient have an ACCT team?: No Does patient have Intensive In-House Services?  : No Does patient have [facility] services? : No Does patient have P4CC services?: No  Past Medical History:   Past Medical History  Diagnosis Date  . Hallucinations 09/30/2014    Sizoaffective  . Depression   . Anxiety   . Hypertension   . Tardive dyskinesia 10/2014    recent onset  . GERD (gastroesophageal reflux disease)   . Hyperlipidemia     Past Surgical History  Procedure Laterality Date  . Wisdom tooth extraction    . Esophagogastroduodenoscopy N/A 11/28/2014    Procedure: ESOPHAGOGASTRODUODENOSCOPY (EGD);  Surgeon: [name], MD;  Location: [facility] ENDOSCOPY;  Service: Endoscopy;  Laterality: N/A;  . Abdominal surgery      "years ago" to remove foreign objects  . Breast lumpectomy     Family History:  Family History  Problem Relation Age of Onset  . Depression Mother   . Hypertension Mother    Family Psychiatric  History: Positive family history for depression possible schizoaffective disorder. Social History:  History  Alcohol Use No     History  Drug Use No    Social History   Social History  . Marital Status: Single    Spouse Name: N/A  . Number of Children: N/A  . Years of Education: N/A   Social History Main Topics  . Smoking status: Current Every Day Smoker -- 0.50 packs/day for 3 years    Types: Cigarettes  . Smokeless tobacco: Never Used  . Alcohol Use: No  . Drug Use: No  . Sexual Activity: No   Other Topics Concern  . None   Social History Narrative   Additional Social History:    Allergies:   Allergies  Allergen Reactions  . Betadine [Povidone Iodine] Other (See Comments)    Reaction:  Unknown   . Shellfish-Derived Products Other (See Comments)    Reaction:  Unknown   . Iodine Rash    Labs:  Results for orders placed or performed during the hospital encounter of 07/30/15 (from the past 48 hour(s))  Creatinine, serum     Status: None   Collection Time: 08/04/15  5:40 AM  Result Value Ref Range   Creatinine, Ser 0.76 0.44 - 1.00 mg/dL   GFR calc non Af Amer >60 >60 mL/min   GFR calc Af Amer >60 >60 mL/min    Comment: (NOTE) The  eGFR has been calculated using the CKD EPI equation. This calculation has not been validated in all clinical situations. eGFR's persistently <60 mL/min signify possible Chronic Kidney Disease.     Current Facility-Administered Medications  Medication Dose Route Frequency Provider Last Rate Last Dose  . 0.9 %  sodium chloride infusion   Intravenous Continuous Loletha Grayer, MD 75 mL/hr at 08/04/15 0548    . amLODipine (NORVASC) tablet 2.5 mg  2.5 mg Oral Daily [name], MD   2.5 mg at 08/04/15 0943  . benztropine (COGENTIN) tablet 1 mg  1 mg Oral QHS [name], MD   1 mg at 08/03/15 2248  . docusate sodium (COLACE) capsule 200 mg  200 mg Oral BID [name], MD   200 mg at 08/04/15 0943  . enoxaparin (LOVENOX) injection 40 mg  40 mg Subcutaneous Q24H Sylvan Cheese, MD   40 mg at 08/03/15 2250  . fluPHENAZine (PROLIXIN) tablet 5 mg  5 mg Oral 3 times per day [name], MD   5 mg at 08/04/15 1337  . HYDROcodone-acetaminophen (NORCO/VICODIN) 5-325 MG per tablet 1 tablet  1 tablet Oral Q6H PRN Loletha Grayer, MD   1 tablet at 08/04/15 0946  . lactulose (CHRONULAC) 10 GM/15ML solution 30 g  30 g Oral BID Loletha Grayer, MD   30 g at 08/04/15 [phone]  . levothyroxine (SYNTHROID, LEVOTHROID) tablet 75 mcg  75 mcg Oral QAC breakfast [name], MD   75 mcg at 08/04/15 [phone]  . lurasidone (LATUDA) tablet 120 mg  120 mg Oral Q breakfast [name], MD   120 mg at 08/04/15 0942  . OXcarbazepine (TRILEPTAL) tablet 150 mg  150 mg Oral BID [name], MD   150 mg at 08/04/15 0943  . polyethylene glycol (MIRALAX / GLYCOLAX) packet 17 g  17 g Oral Daily Earleen Newport, MD   17 g at 08/04/15 [id number]  . senna (SENOKOT) tablet 17.2 mg  2 tablet Oral QHS Loletha Grayer, MD   17.2 mg at 08/03/15 2248  . traZODone (DESYREL) tablet 100 mg  100 mg Oral QHS [name], MD   100 mg at 08/03/15 2248  . venlafaxine XR (EFFEXOR-XR) 24 hr capsule 150 mg  150 mg Oral Q breakfast [name], MD   150 mg at 08/04/15 5916    Musculoskeletal: Strength & Muscle Tone: within normal limits Gait & Station: normal Patient leans: N/A  Psychiatric Specialty Exam: Review of Systems  Constitutional: Negative.   HENT: Negative.   Eyes: Negative.   Respiratory: Negative.   Cardiovascular: Negative.   Gastrointestinal: Positive for abdominal pain and constipation.  Musculoskeletal: Negative.   Skin: Negative.   Neurological: Negative.   Psychiatric/Behavioral: Positive for depression and suicidal ideas. Negative for hallucinations, memory loss and substance  abuse. The patient has insomnia. The patient is not nervous/anxious.     Blood pressure 119/75, pulse 90, temperature 98.8 F (37.1 C), temperature source Oral, resp. rate 20, height 5' 6"  (1.676 m), weight 87.136 kg (192 lb 1.6 oz), SpO2 98 %.Body mass index is 31.02 kg/(m^2).  General Appearance: Casual  Eye Contact::  Good  Speech:  Slow  Volume:  Decreased  Mood:  Depressed and Dysphoric  Affect:  Depressed and Tearful  Thought Process:  Goal Directed  Orientation:  Full (Time, Place, and Person)  Thought Content:  Hallucinations: Auditory Command:  She says they tell her to hurt her self  Suicidal Thoughts:  Yes.  without intent/plan  Homicidal Thoughts:  No  Memory:  Immediate;   Good Recent;   Fair Remote;   Fair  Judgement:  Poor  Insight:  Fair  Psychomotor Activity:  Psychomotor Retardation  Concentration:  Fair  Recall:  [employer] of Knowledge:Fair  Language: Fair  Akathisia:  No  Handed:  Right  AIMS (if indicated):     Assets:  Communication Skills Desire for Improvement Financial Resources/Insurance Housing Social Support  ADL's:  Intact  Cognition: WNL  Sleep:      Treatment Plan Summary: Daily contact with patient to assess and evaluate symptoms and progress in treatment, Medication management and Plan Patient will be admitted to the psychiatry ward. Continue all medication as  currently prescribed including both antipsychotics and antidepressants. She is also on multiple medicines for constipation to try and facilitate a lot of bowel movements of that she can get rid of the foreign body. I don't know that there is an immediate need to repeat the x-ray. Probably wouldn't be a bad idea to reconsult internal medicine. She is still having a little bit of abdominal pain but she is eating fine and is ambulatory. Vital signs stable. Patient is agreeable to the plan. I've discussed with nursing on the medical ward that they can go ahead and initiate calling downstairs in getting her transferred to psychiatry.   Disposition: Recommend psychiatric Inpatient admission when medically cleared. Supportive therapy provided about ongoing stressors.  Alethia Berthold, MD 08/04/2015 3:33 PM

## 2015-08-05 ENCOUNTER — Inpatient Hospital Stay
Admit: 2015-08-05 | Discharge: 2015-09-11 | DRG: 885 | Disposition: A | Discharge status: Home / Self Care | Payer: MEDICAID | Attending: Psychiatry | Admitting: Psychiatry

## 2015-08-05 DIAGNOSIS — Z9889 Other specified postprocedural states: Secondary | ICD-10-CM | POA: Diagnosis not present

## 2015-08-05 DIAGNOSIS — Q438 Other specified congenital malformations of intestine: Secondary | ICD-10-CM | POA: Diagnosis not present

## 2015-08-05 DIAGNOSIS — I10 Essential (primary) hypertension: Secondary | ICD-10-CM | POA: Diagnosis present

## 2015-08-05 DIAGNOSIS — R45851 Suicidal ideations: Secondary | ICD-10-CM | POA: Diagnosis present

## 2015-08-05 DIAGNOSIS — R109 Unspecified abdominal pain: Secondary | ICD-10-CM

## 2015-08-05 DIAGNOSIS — F17210 Nicotine dependence, cigarettes, uncomplicated: Secondary | ICD-10-CM | POA: Diagnosis present

## 2015-08-05 DIAGNOSIS — T189XXD Foreign body of alimentary tract, part unspecified, subsequent encounter: Secondary | ICD-10-CM

## 2015-08-05 DIAGNOSIS — Z818 Family history of other mental and behavioral disorders: Secondary | ICD-10-CM

## 2015-08-05 DIAGNOSIS — T189XXA Foreign body of alimentary tract, part unspecified, initial encounter: Secondary | ICD-10-CM | POA: Diagnosis present

## 2015-08-05 DIAGNOSIS — E8881 Metabolic syndrome: Secondary | ICD-10-CM | POA: Diagnosis present

## 2015-08-05 DIAGNOSIS — T184XXA Foreign body in colon, initial encounter: Secondary | ICD-10-CM

## 2015-08-05 DIAGNOSIS — K5900 Constipation, unspecified: Secondary | ICD-10-CM | POA: Diagnosis present

## 2015-08-05 DIAGNOSIS — G2401 Drug induced subacute dyskinesia: Secondary | ICD-10-CM | POA: Diagnosis present

## 2015-08-05 DIAGNOSIS — F250 Schizoaffective disorder, bipolar type: Secondary | ICD-10-CM | POA: Diagnosis not present

## 2015-08-05 DIAGNOSIS — F259 Schizoaffective disorder, unspecified: Secondary | ICD-10-CM | POA: Diagnosis present

## 2015-08-05 DIAGNOSIS — Z888 Allergy status to other drugs, medicaments and biological substances status: Secondary | ICD-10-CM

## 2015-08-05 DIAGNOSIS — K219 Gastro-esophageal reflux disease without esophagitis: Secondary | ICD-10-CM | POA: Diagnosis present

## 2015-08-05 DIAGNOSIS — E039 Hypothyroidism, unspecified: Secondary | ICD-10-CM | POA: Diagnosis present

## 2015-08-05 DIAGNOSIS — Z8249 Family history of ischemic heart disease and other diseases of the circulatory system: Secondary | ICD-10-CM | POA: Diagnosis not present

## 2015-08-05 DIAGNOSIS — G4700 Insomnia, unspecified: Secondary | ICD-10-CM | POA: Diagnosis present

## 2015-08-05 MED ORDER — FLUPHENAZINE HCL 5 MG PO TABS
5.0000 mg | ORAL_TABLET | Freq: Three times a day (TID) | ORAL | Status: DC
  Administered 2015-08-05 – 2015-09-11 (×111): 5 mg via ORAL
  Filled 2015-08-05 (×111): qty 1

## 2015-08-05 MED ORDER — AMLODIPINE BESYLATE 5 MG PO TABS
2.5000 mg | ORAL_TABLET | Freq: Every day | ORAL | Status: DC
  Administered 2015-08-06 – 2015-09-10 (×36): 2.5 mg via ORAL
  Filled 2015-08-05 (×36): qty 1

## 2015-08-05 MED ORDER — ALUM & MAG HYDROXIDE-SIMETH 200-200-20 MG/5ML PO SUSP
30.0000 mL | ORAL | Status: DC | PRN
  Administered 2015-08-13 – 2015-08-25 (×2): 30 mL via ORAL
  Filled 2015-08-05 (×2): qty 30

## 2015-08-05 MED ORDER — ACETAMINOPHEN 325 MG PO TABS
650.0000 mg | ORAL_TABLET | Freq: Four times a day (QID) | ORAL | Status: DC | PRN
  Administered 2015-08-06 – 2015-09-04 (×10): 650 mg via ORAL
  Filled 2015-08-05 (×11): qty 2

## 2015-08-05 MED ORDER — NICOTINE 21 MG/24HR TD PT24
21.0000 mg | MEDICATED_PATCH | Freq: Every day | TRANSDERMAL | Status: DC
Start: 2015-08-05 — End: 2015-09-11
  Administered 2015-08-05 – 2015-09-07 (×34): 21 mg via TRANSDERMAL
  Filled 2015-08-05 (×36): qty 1

## 2015-08-05 MED ORDER — LEVOTHYROXINE SODIUM 75 MCG PO TABS
75.0000 ug | ORAL_TABLET | Freq: Every day | ORAL | Status: DC
  Administered 2015-08-06 – 2015-09-11 (×37): 75 ug via ORAL
  Filled 2015-08-05 (×39): qty 1

## 2015-08-05 MED ORDER — LURASIDONE HCL 40 MG PO TABS
120.0000 mg | ORAL_TABLET | Freq: Every day | ORAL | Status: DC
  Administered 2015-08-06 – 2015-09-10 (×36): 120 mg via ORAL
  Filled 2015-08-05 (×37): qty 3

## 2015-08-05 MED ORDER — OXCARBAZEPINE 300 MG PO TABS
150.0000 mg | ORAL_TABLET | Freq: Two times a day (BID) | ORAL | Status: DC
  Administered 2015-08-05 – 2015-09-10 (×72): 150 mg via ORAL
  Filled 2015-08-05: qty 1
  Filled 2015-08-05: qty 2
  Filled 2015-08-05 (×8): qty 1
  Filled 2015-08-05: qty 2
  Filled 2015-08-05 (×5): qty 1
  Filled 2015-08-05: qty 2
  Filled 2015-08-05 (×8): qty 1
  Filled 2015-08-05: qty 2
  Filled 2015-08-05 (×2): qty 1
  Filled 2015-08-05: qty 2
  Filled 2015-08-05: qty 1
  Filled 2015-08-05: qty 2
  Filled 2015-08-05 (×9): qty 1
  Filled 2015-08-05: qty 2
  Filled 2015-08-05 (×5): qty 1
  Filled 2015-08-05: qty 7
  Filled 2015-08-05 (×14): qty 1
  Filled 2015-08-05: qty 2
  Filled 2015-08-05 (×11): qty 1

## 2015-08-05 MED ORDER — MAGNESIUM HYDROXIDE 400 MG/5ML PO SUSP
30.0000 mL | Freq: Every day | ORAL | Status: DC | PRN
  Administered 2015-08-09 – 2015-08-24 (×3): 30 mL via ORAL
  Filled 2015-08-05 (×3): qty 30

## 2015-08-05 MED ORDER — DOCUSATE SODIUM 100 MG PO CAPS
200.0000 mg | ORAL_CAPSULE | Freq: Two times a day (BID) | ORAL | Status: DC
  Administered 2015-08-05 – 2015-09-10 (×72): 200 mg via ORAL
  Filled 2015-08-05 (×72): qty 2

## 2015-08-05 MED ORDER — BENZTROPINE MESYLATE 1 MG PO TABS
1.0000 mg | ORAL_TABLET | Freq: Every day | ORAL | Status: DC
  Administered 2015-08-05 – 2015-09-04 (×31): 1 mg via ORAL
  Filled 2015-08-05 (×31): qty 1

## 2015-08-05 MED ORDER — HYDROCODONE-ACETAMINOPHEN 5-325 MG PO TABS: 1.0000 | ORAL_TABLET | Freq: Four times a day (QID) | ORAL | Status: DC | PRN

## 2015-08-05 MED ORDER — VENLAFAXINE HCL ER 75 MG PO CP24
150.0000 mg | ORAL_CAPSULE | Freq: Every day | ORAL | Status: DC
  Administered 2015-08-06 – 2015-09-10 (×36): 150 mg via ORAL
  Filled 2015-08-05 (×36): qty 2

## 2015-08-05 MED ORDER — TRAZODONE HCL 100 MG PO TABS
100.0000 mg | ORAL_TABLET | Freq: Every day | ORAL | Status: DC
  Administered 2015-08-05 – 2015-09-10 (×37): 100 mg via ORAL
  Filled 2015-08-05 (×36): qty 1

## 2015-08-05 MED ORDER — SENNA 8.6 MG PO TABS
2.0000 | ORAL_TABLET | Freq: Every day | ORAL | Status: DC
  Administered 2015-08-05 – 2015-09-10 (×37): 17.2 mg via ORAL
  Filled 2015-08-05 (×36): qty 2

## 2015-08-05 MED ORDER — POLYETHYLENE GLYCOL 3350 17 G PO PACK
17.0000 g | PACK | Freq: Every day | ORAL | Status: DC
  Administered 2015-08-06 – 2015-08-14 (×9): 17 g via ORAL
  Filled 2015-08-05 (×9): qty 1

## 2015-08-05 MED ORDER — LACTULOSE 10 GM/15ML PO SOLN
30.0000 g | Freq: Two times a day (BID) | ORAL | Status: DC
Start: 2015-08-05 — End: 2015-08-16
  Administered 2015-08-05 – 2015-08-15 (×19): 30 g via ORAL
  Filled 2015-08-05 (×3): qty 60
  Filled 2015-08-05: qty 45
  Filled 2015-08-05 (×17): qty 60

## 2015-08-05 NOTE — H&P (Signed)
Psychiatric Admission Assessment Adult  Patient Identification: Melanie Bell MRN:  [id number] Date of Evaluation:  08/05/2015 Chief Complaint:  schizoaffective disorder Principal Diagnosis: Schizoaffective disorder, bipolar type ([place]) Diagnosis:   Patient Active Problem List   Diagnosis Date Noted  . Foreign body ingestion [T18.9XXA] 07/31/2015  . Schizoaffective disorder, bipolar type ([place]) [F25.0] 11/06/2014  . Tardive dyskinesia [G24.01] 10/06/2014  . Tobacco use disorder [F17.200] 09/30/2014  . Hypothyroidism [E03.9] 09/29/2014  . Hypertension [I10] 09/29/2014  . Constipation [K59.00] 09/29/2014   History of Present Illness:  Identifying data. Melanie Bell is a 25 year old female with history of schizoaffective disorder.  Chief complaint. "I swallowed a battery."  History of present illness. Information was obtained from the patient and the chart. [name] has a long history of depression, psychosis and mood instability. She is well known to [name] and has been hospitalized [facility]. She has been relatively stable on a combination of Prolixin, Latuda, Effexor and Trileptal. She has a history of swallowing foreign objects but in the past several months she was able to come to the hospital to ask for help before she swallowed. She reports that on March 15 there was an anniversary of death of her favorite friend who committed suicide. She has been thinking about it a lot. She has been rather anxious and sad about it. She started swallowing again initially it was a triangular metal object. She came to the emergency room but was sent home she then proceeded to swallow and AA bat ery followed by a screw. She was brought to the emergency room and admitted to medical floor. Abdominal x-ray indicates the presence of 3 foreign objects as about that like the show anterograde progression and are now seen in the distal colon. The patient has a history of  constipation and sometimes it is difficult for her to pass for objects that she swallowed. She came to the hospital for help. She denies symptoms of psychosis, depression or severe anxiety. She denies symptoms suggestive of bipolar mania. There are no alcohol or drugs involved.   Past psychiatric history. The patient has been hospitalized numerous times for mood instability, suicidal thinking and swallowing of foreign objects. Recently she seems to develop better coping skills and has been able to come to the hospital before she actually swallows unfavorable objects. She has been tried on multiple medications. Seems that the current regimen keeps her stable.   Family psychiatric history. Mother with depression.   Social history. She is an incompetent adult. She lives in a group home. Her home life was disrupted when her mother divorced her stepfather. She lost contact with her step grandmother who was very important to her. Her mother who is her guardian was diagnosed with with MS. She lives in [place] now.    Past Psychiatric History: Schizoaffective disorder.  Is the patient at risk to self? Yes.    Has the patient been a risk to self in the past 6 months? Yes.    Has the patient been a risk to self within the distant past? Yes.    Is the patient a risk to others? No.  Has the patient been a risk to others in the past 6 months? No.  Has the patient been a risk to others within the distant past? No.   Prior Inpatient Therapy:   Prior Outpatient Therapy:    Alcohol Screening: Patient refused Alcohol Screening Tool: Yes 1. How often do you have a drink  containing alcohol?: Never 9. Have you or someone else been injured as a result of your drinking?: No 10. Has a relative or friend or a doctor or another health worker been concerned about your drinking or suggested you cut down?: No Alcohol Use Disorder Identification Test Final Score (AUDIT): 0 Brief Intervention: AUDIT score less than 7 or  less-screening does not suggest unhealthy drinking-brief intervention not indicated Substance Abuse History in the last 12 months:  No. Consequences of Substance Abuse: NA Previous Psychotropic Medications: Yes  Psychological Evaluations: No  Past Medical History:  Past Medical History  Diagnosis Date  . Hallucinations 09/30/2014    Sizoaffective  . Depression   . Anxiety   . Hypertension   . Tardive dyskinesia 10/2014    recent onset  . GERD (gastroesophageal reflux disease)   . Hyperlipidemia     Past Surgical History  Procedure Laterality Date  . Wisdom tooth extraction    . Esophagogastroduodenoscopy N/A 11/28/2014    Procedure: ESOPHAGOGASTRODUODENOSCOPY (EGD);  Surgeon: [name], MD;  Location[facility] ENDOSCOPY;  Service: Endoscopy;  Laterality: N/A;  . Abdominal surgery      "years ago" to remove foreign objects  . Breast lumpectomy     Family History:  Family History  Problem Relation Age of Onset  . Depression Mother   . Hypertension Mother    Family Psychiatric  History: Mother with depression. Tobacco Screening: _0 ([phone])::1)@ Social History:  History  Alcohol Use No     History  Drug Use No    Additional Social History:      Pain Medications: See PTA Prescriptions: See PTA Over the Counter: See PTA History of alcohol / drug use?: No history of alcohol / drug abuse Longest period of sobriety (when/how long): No history of use                    Allergies:   Allergies  Allergen Reactions  . Betadine [Povidone Iodine] Other (See Comments)    Reaction:  Unknown   . Shellfish-Derived Products Other (See Comments)    Reaction:  Unknown   . Iodine Rash   Lab Results:  Results for orders placed or performed during the hospital encounter of 07/30/15 (from the past 48 hour(s))  Creatinine, serum     Status: None   Collection Time: 08/04/15  5:40 AM  Result Value Ref Range   Creatinine, Ser 0.76 0.44 - 1.00 mg/dL   GFR calc non  Af Amer >60 >60 mL/min   GFR calc Af Amer >60 >60 mL/min    Comment: (NOTE) The eGFR has been calculated using the CKD EPI equation. This calculation has not been validated in all clinical situations. eGFR's persistently <60 mL/min signify possible Chronic Kidney Disease.     Blood Alcohol level:  Lab Results  Component Value Date   [facility] <5 07/30/2015   ETH <5 67/61/9509    Metabolic Disorder Labs:  Lab Results  Component Value Date   HGBA1C 5.2 06/16/2015   Lab Results  Component Value Date   PROLACTIN 15.8 06/16/2015   Lab Results  Component Value Date   CHOL 148 06/16/2015   TRIG 79 06/16/2015   HDL 37* 06/16/2015   CHOLHDL 4.0 06/16/2015   VLDL 16 06/16/2015   LDLCALC 95 06/16/2015   LDLCALC 114* 11/06/2014    Current Medications: Current Facility-Administered Medications  Medication Dose Route Frequency Provider Last Rate Last Dose  . acetaminophen (TYLENOL) tablet 650 mg  650 mg Oral  Q6H PRN [name], MD      . alum & mag hydroxide-simeth (MAALOX/MYLANTA) 200-200-20 MG/5ML suspension 30 mL  30 mL Oral Q4H PRN [name], MD      . Derrill Memo ON 08/06/2015] amLODipine (NORVASC) tablet 2.5 mg  2.5 mg Oral Daily [name], MD      . benztropine (COGENTIN) tablet 1 mg  1 mg Oral QHS [name] [name], MD      . docusate sodium (COLACE) capsule 200 mg  200 mg Oral BID [name], MD      . fluPHENAZine (PROLIXIN) tablet 5 mg  5 mg Oral 3 times per day [name], MD      . lactulose (CHRONULAC) 10 GM/15ML solution 30 g  30 g Oral BID [name], MD      . Derrill Memo ON 08/06/2015] levothyroxine (SYNTHROID, LEVOTHROID) tablet 75 mcg  75 mcg Oral QAC breakfast [name], MD      . Derrill Memo ON 08/06/2015] lurasidone (LATUDA) tablet 120 mg  120 mg Oral Q breakfast [name], MD      . magnesium hydroxide (MILK OF MAGNESIA) suspension 30 mL  30 mL Oral Daily PRN [name], MD      . nicotine (NICODERM CQ - dosed in mg/24 hours) patch 21 mg  21 mg  Transdermal Daily Preethi Scantlebury B Sherine Cortese, MD      . Oxcarbazepine (TRILEPTAL) tablet 150 mg  150 mg Oral BID [name], MD      . Derrill Memo ON 08/06/2015] polyethylene glycol (MIRALAX / GLYCOLAX) packet 17 g  17 g Oral Daily [name], MD      . senna (SENOKOT) tablet 17.2 mg  2 tablet Oral QHS [name], MD      . traZODone (DESYREL) tablet 100 mg  100 mg Oral QHS [name], MD      . Derrill Memo ON 08/06/2015] venlafaxine XR (EFFEXOR-XR) 24 hr capsule 150 mg  150 mg Oral Q breakfast [name], MD       PTA Medications: Prescriptions prior to admission  Medication Sig Dispense Refill Last Dose  . acetaminophen (TYLENOL) 325 MG tablet Take 2 tablets (650 mg total) by mouth every 6 (six) hours as needed for mild pain. 30 tablet 0 PRN at PRN  . alum & mag hydroxide-simeth (MAALOX/MYLANTA) 200-200-20 MG/5ML suspension Take 30 mLs by mouth every 4 (four) hours as needed for indigestion. 355 mL 0 PRN at PRN  . amLODipine (NORVASC) 2.5 MG tablet Take 1 tablet (2.5 mg total) by mouth daily. 30 tablet 0 07/30/2015 at Unknown time  . benztropine (COGENTIN) 1 MG tablet Take 1 tablet (1 mg total) by mouth at bedtime. 30 tablet 0 07/30/2015 at Unknown time  . docusate sodium (COLACE) 100 MG capsule Take 2 capsules (200 mg total) by mouth 2 (two) times daily. 120 capsule 0 PRN at PRN  . fluPHENAZine (PROLIXIN) 5 MG tablet Take 1 tablet (5 mg total) by mouth 3 (three) times daily. 90 tablet 0 07/30/2015 at Unknown time  . ibuprofen (ADVIL,MOTRIN) 800 MG tablet Take 800 mg by mouth every 8 (eight) hours as needed.   Completed Course at Unknown time  . levothyroxine (SYNTHROID, LEVOTHROID) 75 MCG tablet Take 1 tablet (75 mcg total) by mouth daily before breakfast. 30 tablet 0 07/30/2015 at Unknown time  . Lurasidone HCl 120 MG TABS Take 1 tablet (120 mg total) by mouth daily with supper.  30 tablet 0 07/30/2015 at Unknown time  . OXcarbazepine (TRILEPTAL) 150 MG tablet Take 1 tablet (150 mg total) by mouth  2 (two) times daily. 60 tablet 0 07/30/2015 at Unknown time  . traZODone (DESYREL) 50 MG tablet Take 1 tablet (50 mg total) by mouth at bedtime. 30 tablet 0 07/30/2015 at Unknown time  . venlafaxine XR (EFFEXOR-XR) 150 MG 24 hr capsule Take 1 capsule (150 mg total) by mouth daily with breakfast. 30 capsule 0 07/30/2015 at Unknown time    Musculoskeletal: Strength & Muscle Tone: within normal limits Gait & Station: normal Patient leans: N/A  Psychiatric Specialty Exam: I reviewed physical exam performed on the medical floor and agree with her findings. Physical Exam  Nursing note and vitals reviewed.   Review of Systems  Psychiatric/Behavioral: Positive for depression and suicidal ideas.  All other systems reviewed and are negative.   Blood pressure 112/60, pulse 16, temperature 99.1 F (37.3 C), temperature source Oral, height _0  (1.676 m), weight 86.183 kg (190 lb), SpO2 98 %.Body mass index is 30.68 kg/(m^2).  See SRA.                                                       Treatment Plan Summary: Daily contact with patient to assess and evaluate symptoms and progress in treatment and Medication management   Melanie Bell is a 25 year old female with history of schizoaffective disorder admitted for worsening of depression and suicide attempt by swallowing a AA battery and a screw.   1. Suicidal ideation. The patient is able to contract for safety in the hospital.    2. Mood and psychosis. We continued Prolixin and Latuda for psychosis. We continued Lamictal and Trileptal for mood stabilization, and Effexor for depression.   3. Hypertension. She is on amlodipine.  4. Constipation. She is on bowel regimen for constipation. I am holding narcotics.  5. Hypothyroidism. She is on Synthroid.  6. Smoking. Nicotine patch is available.  7. Insomnia. She is on trazodone.  8. Social. She is incompetent adult. Her Kara Dies is the guardian. She recently moved to  [place].  9. Metabolic syndrome. Lipid panel, hemoglobin A1c, TSH, and PRL were checked during previous admission and are normal. Prolactin 15.   10. Foreign body ingestion. There are three foreign bodies present in distal colon on Xray with likely anterograde progression.  11. Disposition. She will be discharged back to her group home. She will follow up with her primary psychiatrist at [place].   Observation Level/Precautions:  15 minute checks  Laboratory:  CBC Chemistry Profile UDS UA abdominal X-ray.  Psychotherapy:    Medications:    Consultations:    Discharge Concerns:    Estimated LOS:  Other:     I certify that inpatient services furnished can reasonably be expected to improve the patient's condition.    Orson Slick, MD 3/22/20173:53 PM

## 2015-08-05 NOTE — BHH Group Notes (Signed)
[place] LCSW Group Therapy   08/05/2015 1:15 PM   Type of Therapy: Group Therapy   Participation Level: Active   Participation Quality: Attentive, Sharing and Supportive   Affect: Depressed and Flat   Cognitive: Alert and Oriented   Insight: Developing/Improving and Engaged   Engagement in Therapy: Developing/Improving and Engaged   Modes of Intervention: Clarification, Confrontation, Discussion, Education, Exploration, Limit-setting, Orientation, Problem-solving, Rapport Building, Art therapist, Socialization and Support   Summary of Progress/Problems: The topic for group today was emotional regulation. This group focused on both positive and negative emotion identification and allowed group members to process ways to identify feelings, regulate negative emotions, and find healthy ways to manage internal/external emotions. Group members were asked to reflect on a time when their reaction to an emotion led to a negative outcome and explored how alternative responses using emotion regulation would have benefited them. Group members were also asked to discuss a time when emotion regulation was utilized when a negative emotion was experienced. Pt reported she enjoys listening to music in order to regulate her emotions and feelings.  Pt shared she has reacted to both positive and negative emotions in ways that have been costly for her. Pt shared she chooses to react only to positive emotions, but that she carefully considers how she reacts to positive emotions as well.  Pt was polite and cooperative with the CSW and other group members and focused and attentive to the topics discussed and the sharing of others.  CSW actively validated the pt's opinion and provided feedback.      [name]. Thanh Pomerleau, MSW, LCSWA, LCAS

## 2015-08-05 NOTE — BH Assessment (Signed)
Patient is to be admitted to [facility] by Dr. Weber Cooks. Attending Physician will be Dr. Bary Leriche.   Patient has been assigned to room 307, by [place].   Intake Paper Work has been signed and placed on patient chart.  2C staff is aware of the admission ([place], [place], Patient Access).

## 2015-08-05 NOTE — Tx Team (Signed)
Initial Interdisciplinary Treatment Plan   PATIENT STRESSORS: Health problems   PATIENT STRENGTHS: Capable of independent living Communication skills   PROBLEM LIST: Problem List/Patient Goals Date to be addressed Date deferred Reason deferred Estimated date of resolution  Schizoaffective Disorder  3/22           Suicide Risk/self harm  3/22                                          DISCHARGE CRITERIA:  Improved stabilization in mood, thinking, and/or behavior Reduction of life-threatening or endangering symptoms to within safe limits  PRELIMINARY DISCHARGE PLAN: Attend aftercare/continuing care group Return to previous living arrangement  PATIENT/FAMIILY INVOLVEMENT: This treatment plan has been presented to and reviewed with the patient, Melanie Bell, and/or family member, .  The patient and family have been given the opportunity to ask questions and make suggestions.  Raul Del 08/05/2015, 12:49 PM

## 2015-08-05 NOTE — BHH Group Notes (Signed)
[facility] Group Notes:  (Nursing/MHT/Case Management/Adjunct)  Date:  08/05/2015  Time:  9:38 PM  Type of Therapy:  Evening Wrap-up Group  Participation Level:  Minimal  Participation Quality:  Appropriate  Affect:  Appropriate  Cognitive:  Appropriate  Insight:  Improving  Engagement in Group:  Developing/Improving  Modes of Intervention:  Discussion  Summary of Progress/Problems:  Patient had very minimal interaction during wrap-up group. Patient states that she is feeling better.  Lissette Schenk Sunnie Nielsen 08/05/2015, 9:38 PM

## 2015-08-05 NOTE — Progress Notes (Signed)
Pt d/c to [facility] today--report called to [place].  IV removed intact. D/C paperwork reviewed and education provided with all questions and concerns addressed.  Pt has safety sitter at bedside for home transport.  Pt access has been called for admission to [facility].

## 2015-08-05 NOTE — BHH Suicide Risk Assessment (Signed)
[facility] Admission Suicide Risk Assessment   Nursing information obtained from:    Demographic factors:    Current Mental Status:    Loss Factors:    Historical Factors:    Risk Reduction Factors:     Total Time spent with patient: 1 hour Principal Problem: Schizoaffective disorder, bipolar type ([place]) Diagnosis:   Patient Active Problem List   Diagnosis Date Noted  . Foreign body ingestion [T18.9XXA] 07/31/2015  . Schizoaffective disorder, bipolar type ([place]) [F25.0] 11/06/2014  . Tardive dyskinesia [G24.01] 10/06/2014  . Tobacco use disorder [F17.200] 09/30/2014  . Hypothyroidism [E03.9] 09/29/2014  . Hypertension [I10] 09/29/2014  . Constipation [K59.00] 09/29/2014   Subjective Data: Depression, suicide attempt.  Continued Clinical Symptoms:  Alcohol Use Disorder Identification Test Final Score (AUDIT): 0 The "Alcohol Use Disorders Identification Test", Guidelines for Use in Primary Care, Second Edition.  World Pharmacologist [facility]). Score between 0-7:  no or low risk or alcohol related problems. Score between 8-15:  moderate risk of alcohol related problems. Score between 16-19:  high risk of alcohol related problems. Score 20 or above:  warrants further diagnostic evaluation for alcohol dependence and treatment.   CLINICAL FACTORS:   Bipolar Disorder:   Depressive phase Schizophrenia:   Depressive state Less than 87 years old   Musculoskeletal: Strength & Muscle Tone: within normal limits Gait & Station: normal Patient leans: N/A  Psychiatric Specialty Exam: Review of Systems  Psychiatric/Behavioral: Positive for depression.  All other systems reviewed and are negative.   Blood pressure 112/60, pulse 16, temperature 99.1 F (37.3 C), temperature source Oral, height 5\' 6"  (1.676 m), weight 86.183 kg (190 lb), SpO2 98 %.Body mass index is 30.68 kg/(m^2).  General Appearance: Casual  Eye Contact::  Good  Speech:  Clear and Coherent  Volume:  Normal  Mood:   Anxious  Affect:  Appropriate  Thought Process:  Goal Directed  Orientation:  Full (Time, Place, and Person)  Thought Content:  WDL  Suicidal Thoughts:  Yes.  with intent/plan  Homicidal Thoughts:  No  Memory:  Immediate;   Fair Recent;   Fair Remote;   Fair  Judgement:  Poor  Insight:  Shallow  Psychomotor Activity:  Normal  Concentration:  Fair  Recall:  [place]: Fair  Akathisia:  No  Handed:  Right  AIMS (if indicated):     Assets:  Communication Skills Desire for Improvement Financial Resources/Insurance Housing Physical Health Resilience Social Support  Sleep:     Cognition: WNL  ADL's:  Intact    COGNITIVE FEATURES THAT CONTRIBUTE TO RISK:  None    SUICIDE RISK:   Moderate:  Frequent suicidal ideation with limited intensity, and duration, some specificity in terms of plans, no associated intent, good self-control, limited dysphoria/symptomatology, some risk factors present, and identifiable protective factors, including available and accessible social support.  PLAN OF CARE: Hospital admission, medication management, GI follow-up, discharge planning.  [name] is a 25 year old female with history of schizoaffective disorder admitted for worsening of depression and suicide attempt by swallowing a AA battery and a screw.   1. Suicidal ideation. The patient is able to contract for safety in the hospital.    2. Mood and psychosis. We continued Prolixin and Latuda for psychosis. We continued Lamictal and Trileptal for mood stabilization, and Effexor for depression.   3. Hypertension. She is on amlodipine.  4. Constipation. She is on bowel regimen for constipation. I am holding narcotics.  5. Hypothyroidism. She is  on Synthroid.  6. Smoking. Nicotine patch is available.  7. Insomnia. She is on trazodone.  8. Social. She is incompetent adult. Her Kara Dies is the guardian. She recently moved to [place].  9. Metabolic syndrome.  Lipid panel, hemoglobin A1c, TSH, and PRL were checked during previous admission and are normal. Prolactin 15.   10. Foreign body ingestion. There are three foreign bodies present in distal colon on Xray with likely anterograde progression.  11. Disposition. She will be discharged back to her group home. She will follow up with her primary psychiatrist at [place].    I certify that inpatient services furnished can reasonably be expected to improve the patient's condition.   Orson Slick, MD 08/05/2015, 3:42 PM

## 2015-08-05 NOTE — Progress Notes (Signed)
Recreation Therapy Notes  Date: 03.22.17 Time: 1:00 pm Location: Craft Room  Group Topic: Self-esteem  Goal Area(s) Addresses:  Patient will write at least one positive trait about self. Patient will verbalize benefit of having a healthy self-esteem.  Behavioral Response: Arrived late, Attentive  Intervention: I Am  Activity: Patients were given a worksheet with the letter I on it and instructed to write as many positive traits about themselves inside the letter I.  Education: LRT educated patients on ways they can increase their self-esteem.  Education Outcome: Acknowledges education/In group clarification offered  Clinical Observations/Feedback: Patient arrived to group at approximately 1:18 pm. Patient completed activity by writing positive traits down. Patient did not contribute to group discussion.  Leonette Monarch, LRT/CTRS 08/05/2015 3:17 PM

## 2015-08-05 NOTE — Discharge Instructions (Signed)
Swallowed Foreign Body, Adult A swallowed foreign body means that you swallow something and it gets stuck. It might be food or something else. The object may get stuck in the tube that connects your throat to your stomach (esophagus), or it may get stuck in another part of your belly (digestive tract). It is very important to tell your doctor what you have swallowed. Sometimes, the object will pass through your body on its own. Sometimes, the object will pass after you are given a medicine to relax your throat. The object may need to be taken out by a doctor if it is dangerous or if it will not pass through your body on its own. An object may need to be removed with surgery if:  It gets stuck in your throat.  You cannot swallow.  You cannot breathe well.  It is sharp.  It is harmful or poisonous (toxic), like batteries or drugs. HOME CARE  Eat what you normally eat if your doctor says that you can.  Keep checking your poop (stool) to see if the object has come out of your body.  Call your doctor if the object has not come out of your body after 3 days. If you had surgery (endoscopy) to remove the object:  Care for yourself after surgery as told by your doctor. Keep all follow-up visits as told by your doctor. This is important. SEEK MEDICAL CARE IF:  You still have problems after you have been treated.  The object has not come out of your body after 3 days. GET HELP RIGHT AWAY IF:  You have a fever.  You have pain in your chest or your belly.  You cough up blood.  You have blood in your poop or your throw up (vomit).   This information is not intended to replace advice given to you by your health care provider. Make sure you discuss any questions you have with your health care provider.   Document Released: 08/17/2010 Document Revised: 01/21/2015 Document Reviewed: 07/30/2014 Elsevier Interactive Patient Education [employer].

## 2015-08-05 NOTE — Progress Notes (Signed)
Initial Nutrition Assessment     INTERVENTION:   No intervention as pt meeting nutritional needs at this time. Will sign off. Please re-consult RD as needed   NUTRITION DIAGNOSIS:   No nutrition diagnosis at this time   REASON FOR ASSESSMENT:   Malnutrition Screening Tool    ASSESSMENT:    Pt admitted with behavioral medicine from inpatient unit after suicide attempt via ingestion of foreign body (nail, earring and battery); pt with hx of schizoaffective disorder with auditory hallucinations  Past Medical History  Diagnosis Date  . Hallucinations 09/30/2014    Sizoaffective  . Depression   . Anxiety   . Hypertension   . Tardive dyskinesia 10/2014    recent onset  . GERD (gastroesophageal reflux disease)   . Hyperlipidemia      Diet Order:  Diet regular Room service appropriate?: Yes; Fluid consistency:: Thin   Energy Intake: pt just admitted with behavioral medicine. RD followed pt while on inpatient medical unit from 3/18 until this AM and pt eating 90-100% of meals on Regular diet with good appetite  Last BM:    +BMs   Recent Labs Lab 07/30/15 1838 08/01/15 0612 08/04/15 0540  NA 140 141  --   K 4.2 3.6  --   CL 111 113*  --   CO2 24 24  --   BUN 8 10  --   CREATININE 0.81 0.55 0.76  CALCIUM 9.5 9.5  --   GLUCOSE 64* 95  --     Meds: lactulose, miralax  Height:   Ht Readings from Last 1 Encounters:  07/30/15 5\' 6"  (1.676 m)    Weight: pt has been losing wt but per reportt, this wt has been intentional through diet modifications  Wt Readings from Last 1 Encounters:  08/01/15 192 lb 1.6 oz (87.136 kg)   BMI:  There is no weight on file to calculate BMI.  Wt Readings from Last 10 Encounters:  08/01/15 192 lb 1.6 oz (87.136 kg)  07/13/15 195 lb (88.451 kg)  06/30/15 190 lb (86.183 kg)  06/22/15 192 lb (87.091 kg)  06/15/15 192 lb (87.091 kg)  06/15/15 190 lb (86.183 kg)  06/13/15 190 lb (86.183 kg)  12/05/14 212 lb (96.163 kg)  12/03/14  220 lb (99.791 kg)  11/28/14 220 lb (99.791 kg)    EDUCATION NEEDS:   No education needs identified at this time   Kerman Passey [place], [place], [place] [phone] Pager  [phone] Weekend/On-Call Pager

## 2015-08-05 NOTE — Progress Notes (Signed)
   08/05/15 0951  Clinical Encounter Type  Visited With Patient  Visit Type Follow-up  Consult/Referral To Chaplain  Chaplain rounded in unit and offered pastoral care.   [place] [phone]

## 2015-08-06 NOTE — Plan of Care (Signed)
Problem: Consults Goal: Psychosis Patient Education See Patient Education Module for education specifics.  Outcome: Progressing No SI/HI/SH behavior at this time.

## 2015-08-06 NOTE — Tx Team (Signed)
Interdisciplinary Treatment Plan Update (Adult)  Date:  08/06/2015 Time Reviewed:  5:16 PM  Progress in Treatment: Attending groups: Yes. Participating in groups:  Yes. Taking medication as prescribed:  Yes. Tolerating medication:  Yes. Family/Significant othe contact made:  No, will contact:  guardian  Patient understands diagnosis:  Yes. Discussing patient identified problems/goals with staff:  Yes. Medical problems stabilized or resolved:  Yes. Denies suicidal/homicidal ideation: Yes. Issues/concerns per patient self-inventory:  Yes. Other:  New problem(s) identified: No, Describe:  NA  Discharge Plan or Barriers: Pt plans to return home and follow up with outpatient.    Reason for Continuation of Hospitalization: Medical Issues Medication stabilization Suicidal ideation  Comments:Miss Anderson has a long history of depression, psychosis and mood instability. She is well known to us and [facility]. She has been relatively stable on a combination of Prolixin, Latuda, Effexor and Trileptal. She has a history of swallowing foreign objects but in the past several months she was able to come to the hospital to ask for help before she swallowed. She reports that on March 15 there was an anniversary of death of her favorite friend who committed suicide. She has been thinking about it a lot. She has been rather anxious and sad about it. She started swallowing again initially it was a triangular metal object. She came to the emergency room but was sent home she then proceeded to swallow and AA bat ery followed by a screw. She was brought to the emergency room and admitted to medical floor. Abdominal x-ray indicates the presence of 3 foreign objects as about that like the show anterograde progression and are now seen in the distal colon. The patient has a history of constipation and sometimes it is difficult for her to pass for objects  that she swallowed. She came to the hospital for help. She denies symptoms of psychosis, depression or severe anxiety. She denies symptoms suggestive of bipolar mania. There are no alcohol or drugs involved.    Estimated length of stay: 4 days   New goal(s): NA  Review of initial/current patient goals per problem list:   1.  Goal(s): Patient will participate in aftercare plan * Met:  * Target date: at discharge * As evidenced by: Patient will participate within aftercare plan AEB aftercare provider and housing plan at discharge being identified.   2.  Goal (s): Patient will exhibit decreased depressive symptoms and suicidal ideations. * Met:  *  Target date: at discharge * As evidenced by: Patient will utilize self rating of depression at 3 or below and demonstrate decreased signs of depression or be deemed stable for discharge by MD.  Attendees: Patient:  Melanie Bell 3/23/20175:16 PM  Family:   3/23/20175:16 PM  Physician:   Dr. [name]  3/23/20175:16 PM  Nursing:   Jennifer, RN  3/23/20175:16 PM  Case Manager:   3/23/20175:16 PM  Counselor:   3/23/20175:16 PM  Other:   L , LCSWA 3/23/20175:16 PM  Other: Beth Greene, LRT   3/23/20175:16 PM  Other:   3/23/20175:16 PM  Other:  3/23/20175:16 PM  Other:  3/23/20175:16 PM  Other:  3/23/20175:16 PM  Other:  3/23/20175:16 PM  Other:  3/23/20175:16 PM  Other:  3/23/20175:16 PM  Other:   3/23/20175:16 PM   Scribe for Treatment Team:    L , MSW, LCSWA  08/06/2015, 5:16 PM  

## 2015-08-06 NOTE — BHH Group Notes (Signed)
[place] LCSW Group Therapy   08/06/2015 11am   Type of Therapy: Group Therapy   Participation Level: Active   Participation Quality: Attentive, Sharing and Supportive   Affect: Appropriate   Cognitive: Alert and Oriented   Insight: Developing/Improving and Engaged   Engagement in Therapy: Developing/Improving and Engaged   Modes of Intervention: Clarification, Confrontation, Discussion, Education, Exploration, Limit-setting, Orientation, Problem-solving, Rapport Building, Art therapist, Socialization and Support   Summary of Progress/Problems: The topic for group was balance in life. Today's group focused on defining balance in one's own words, identifying things that can knock one off balance, and exploring healthy ways to maintain balance in life. Group members were asked to provide an example of a time when they felt off balance, describe how they handled that situation, and process healthier ways to regain balance in the future. Group members were asked to share the most important tool for maintaining balance that they learned while at [facility] and how they plan to apply this method after discharge.  Pt defined for her that maintaining balance difficult due to having to live in a group home.  Pt shared that she utilizes her mother, therapy and medication management to maintain balance to achieving her goal of remaining balance.  Pt shared she once felt off balance after she was not told her grandmother was sick, until after she had died. Pt shared that in the future she will talk out her feelings with her therapist and others and seek medication management and therapy.  CSW actively validated the pt's opinion and provided feedback.  Pt was polite and cooperative with the CSW and other group members and focused and attentive to the topics discussed and the sharing of others.

## 2015-08-06 NOTE — BHH Group Notes (Signed)
[facility] Group Notes:  (Nursing/MHT/Case Management/Adjunct)  Date:  08/06/2015  Time:  1:53 PM  Type of Therapy:  Group Therapy  Participation Level:  Active  Participation Quality:  Appropriate and Attentive  Affect:  Appropriate  Cognitive:  Alert, Appropriate and Oriented  Insight:  Appropriate  Engagement in Group:  Engaged  Modes of Intervention:  Discussion and Socialization  Summary of Progress/Problems:  Melanie Bell 08/06/2015, 1:53 PM

## 2015-08-06 NOTE — Progress Notes (Signed)
[facility] MD Progress Note  08/06/2015 10:20 PM Melanie Bell  MRN:  [name]  Subjective:    Melanie Bell is a 25 year old female with a history of schizoaffective disorder who also swallows foreign objects in duress. She has been stable on her current regimen. She returned to the hospital, and was briefly admitted to medicine, after she swallowed 3 foreign objects including an AA batery and a screw. She has not pass it yet. She is on Lactulose. I do not know if the patient can be trusted completely so I will order abdomen X-ray on Sunday to see if there is progression.   Today Melanie Bell reports further progress. Her mood is improving, affect is brighter. She has only passing thoughts of suicide when she thinks of deceased friend. She is not psychotic. She is able to contract for safety in the hospital. Sleep and appetite are good. She accepts medications and tolerates them well. There is some group participation.    Principal Problem: Schizoaffective disorder, bipolar type ([place]) Diagnosis:   Patient Active Problem List   Diagnosis Date Noted  . Foreign body ingestion [T18.9XXA] 07/31/2015  . Schizoaffective disorder, bipolar type ([place]) [F25.0] 11/06/2014  . Tardive dyskinesia [G24.01] 10/06/2014  . Tobacco use disorder [F17.200] 09/30/2014  . Hypothyroidism [E03.9] 09/29/2014  . Hypertension [I10] 09/29/2014  . Constipation [K59.00] 09/29/2014   Total Time spent with patient: 20 minutes    Past Psychiatric History: Schizoaffective disorder.  Past Medical History:  Past Medical History  Diagnosis Date  . Hallucinations 09/30/2014    Sizoaffective  . Depression   . Anxiety   . Hypertension   . Tardive dyskinesia 10/2014    recent onset  . GERD (gastroesophageal reflux disease)   . Hyperlipidemia     Past Surgical History  Procedure Laterality Date  . Wisdom tooth extraction    . Esophagogastroduodenoscopy N/A 11/28/2014    Procedure: ESOPHAGOGASTRODUODENOSCOPY (EGD);   Surgeon: [name], MD;  Location: [facility] ENDOSCOPY;  Service: Endoscopy;  Laterality: N/A;  . Abdominal surgery      "years ago" to remove foreign objects  . Breast lumpectomy     Family History:  Family History  Problem Relation Age of Onset  . Depression Mother   . Hypertension Mother    Family Psychiatric  History: Mother with depression. Social History:  History  Alcohol Use No     History  Drug Use No    Social History   Social History  . Marital Status: Single    Spouse Name: N/A  . Number of Children: N/A  . Years of Education: N/A   Social History Main Topics  . Smoking status: Current Every Day Smoker -- 0.50 packs/day for 3 years    Types: Cigarettes  . Smokeless tobacco: Never Used  . Alcohol Use: No  . Drug Use: No  . Sexual Activity: No   Other Topics Concern  . None   Social History Narrative   Additional Social History:    Pain Medications: See PTA Prescriptions: See PTA Over the Counter: See PTA History of alcohol / drug use?: No history of alcohol / drug abuse Longest period of sobriety (when/how long): No history of use                    Sleep: Fair  Appetite:  Fair  Current Medications: Current Facility-Administered Medications  Medication Dose Route Frequency Provider Last Rate Last Dose  . acetaminophen (TYLENOL) tablet 650 mg  650 mg  Oral Q6H PRN [name], MD   650 mg at 08/06/15 2034  . alum & mag hydroxide-simeth (MAALOX/MYLANTA) 200-200-20 MG/5ML suspension 30 mL  30 mL Oral Q4H PRN [name], MD      . amLODipine (NORVASC) tablet 2.5 mg  2.5 mg Oral Daily [name], MD   2.5 mg at 08/06/15 0907  . benztropine (COGENTIN) tablet 1 mg  1 mg Oral QHS [name], MD   1 mg at 08/06/15 2212  . docusate sodium (COLACE) capsule 200 mg  200 mg Oral BID [name], MD   200 mg at 08/06/15 2212  . fluPHENAZine (PROLIXIN) tablet 5 mg  5 mg Oral 3 times per day [name], MD   5 mg at 08/06/15 2212   . lactulose (CHRONULAC) 10 GM/15ML solution 30 g  30 g Oral BID [name], MD   30 g at 08/06/15 2211  . levothyroxine (SYNTHROID, LEVOTHROID) tablet 75 mcg  75 mcg Oral QAC breakfast [name], MD   75 mcg at 08/06/15 [phone]  . lurasidone (LATUDA) tablet 120 mg  120 mg Oral Q breakfast [name], MD   120 mg at 08/06/15 0907  . magnesium hydroxide (MILK OF MAGNESIA) suspension 30 mL  30 mL Oral Daily PRN [name], MD      . nicotine (NICODERM CQ - dosed in mg/24 hours) patch 21 mg  21 mg Transdermal Daily Dorma Altman B Ida Uppal, MD   21 mg at 08/06/15 0906  . Oxcarbazepine (TRILEPTAL) tablet 150 mg  150 mg Oral BID [name], MD   150 mg at 08/06/15 2212  . polyethylene glycol (MIRALAX / GLYCOLAX) packet 17 g  17 g Oral Daily [name], MD   17 g at 08/06/15 0906  . senna (SENOKOT) tablet 17.2 mg  2 tablet Oral QHS [name], MD   17.2 mg at 08/06/15 2212  . traZODone (DESYREL) tablet 100 mg  100 mg Oral QHS [name], MD   100 mg at 08/06/15 2212  . venlafaxine XR (EFFEXOR-XR) 24 hr capsule 150 mg  150 mg Oral Q breakfast [name], MD   150 mg at 08/06/15 [id number]    Lab Results: No results found for this or any previous visit (from the past 48 hour(s)).  Blood Alcohol level:  Lab Results  Component Value Date   ETH <5 07/30/2015   ETH <5 07/13/2015    Physical Findings: AIMS: Facial and Oral Movements Muscles of Facial Expression: None, normal Lips and Perioral Area: None, normal Jaw: None, normal Tongue: None, normal,Extremity Movements Upper (arms, wrists, hands, fingers): None, normal Lower (legs, knees, ankles, toes): None, normal, Trunk Movements Neck, shoulders, hips: None, normal, Overall Severity Severity of abnormal movements (highest score from questions above): None, normal Incapacitation due to abnormal movements: None, normal Patient's awareness of abnormal movements (rate only patient's report): Aware, no distress, Dental  Status Current problems with teeth and/or dentures?: No Does patient usually wear dentures?: No  CIWA:    COWS:     Musculoskeletal: Strength & Muscle Tone: within normal limits Gait & Station: normal Patient leans: N/A  Psychiatric Specialty Exam: Review of Systems  All other systems reviewed and are negative.   Blood pressure 113/74, pulse 93, temperature 99.1 F (37.3 C), temperature source Oral, resp. rate 16, height 5\' 6"  (1.676 m), weight 86.183 kg (190 lb), SpO2 98 %.Body mass index is 30.68 kg/(m^2).  General Appearance: Casual  Eye Contact::  Good  Speech:  Clear and Coherent  Volume:  Normal  Mood:  Anxious and Depressed  Affect:  Appropriate  Thought Process:  Goal Directed  Orientation:  Full (Time, Place, and Person)  Thought Content:  WDL  Suicidal Thoughts:  Yes.  with intent/plan  Homicidal Thoughts:  No  Memory:  Immediate;   Fair Recent;   Fair Remote;   Fair  Judgement:  Impaired  Insight:  Shallow  Psychomotor Activity:  Normal  Concentration:  Fair  Recall:  [place]  Language: Fair  Akathisia:  No  Handed:  Right  AIMS (if indicated):     Assets:  Communication Skills Desire for Improvement Financial Resources/Insurance Housing Physical Health Resilience Social Support  ADL's:  Intact  Cognition: WNL  Sleep:  Number of Hours: 6.75   Treatment Plan Summary: Daily contact with patient to assess and evaluate symptoms and progress in treatment and Medication management   Melanie Bell is a 25 year old female with a history of schizoaffective disorder admitted for worsening of depression and suicide attempt by swallowing an AA battery and a screw.   1. Suicidal ideation. The patient is able to contract for safety in the hospital.   2. Mood and psychosis. We continued Prolixin and Latuda for psychosis. We continued Trileptal for mood stabilization, and Effexor for depression.   3. Hypertension. She is on  amlodipine.  4. Constipation. She is on bowel regimen for constipation. I am holding narcotics.  5. Hypothyroidism. She is on Synthroid.  6. Smoking. Nicotine patch is available.  7. Insomnia. She is on trazodone.  8. Social. She is incompetent adult. Her Kara Dies is the guardian. She recently moved to [place].  9. Metabolic syndrome. Lipid panel, hemoglobin A1c, TSH, and PRL were checked during previous admission and are normal. Prolactin 15.   10. Foreign body ingestion. There are three foreign objects present in distal colon on Xray with likely anterograde progression. No bowel movement so far in spite of lactulose. Will take another Xray on Sunday.  11. Disposition. She will be discharged back to her group home. She will follow up with her primary psychiatrist at [facility].    Orson Slick, MD 08/06/2015, 10:20 PM

## 2015-08-06 NOTE — Progress Notes (Signed)
[facility] MD Progress Note  08/06/2015 2:26 PM Melanie Bell  MRN:  [name]  Subjective:  [name] still feels depressed. She is vaguely suicidal with no plan in the hospital. She is unable to contract for safety in the community. She accepts medication and tolerates them well. There is no group participation so far. She did not have bowel movement as of yet so the back. This groomed and the triangular metal objects are still in her colon. We will monitor. There are no somatic complaints.  Principal Problem: Schizoaffective disorder, bipolar type ([place]) Diagnosis:   Patient Active Problem List   Diagnosis Date Noted  . Foreign body ingestion [T18.9XXA] 07/31/2015  . Schizoaffective disorder, bipolar type ([place]) [F25.0] 11/06/2014  . Tardive dyskinesia [G24.01] 10/06/2014  . Tobacco use disorder [F17.200] 09/30/2014  . Hypothyroidism [E03.9] 09/29/2014  . Hypertension [I10] 09/29/2014  . Constipation [K59.00] 09/29/2014   Total Time spent with patient: 20 minutes  Past Psychiatric History: Schizoaffective disorder.  Past Medical History:  Past Medical History  Diagnosis Date  . Hallucinations 09/30/2014    Sizoaffective  . Depression   . Anxiety   . Hypertension   . Tardive dyskinesia 10/2014    recent onset  . GERD (gastroesophageal reflux disease)   . Hyperlipidemia     Past Surgical History  Procedure Laterality Date  . Wisdom tooth extraction    . Esophagogastroduodenoscopy N/A 11/28/2014    Procedure: ESOPHAGOGASTRODUODENOSCOPY (EGD);  Surgeon: [name], MD;  Location: [facility] ENDOSCOPY;  Service: Endoscopy;  Laterality: N/A;  . Abdominal surgery      "years ago" to remove foreign objects  . Breast lumpectomy     Family History:  Family History  Problem Relation Age of Onset  . Depression Mother   . Hypertension Mother    Family Psychiatric  History: Mother with depression. Social History:  History  Alcohol Use No     History  Drug Use No    Social  History   Social History  . Marital Status: Single    Spouse Name: N/A  . Number of Children: N/A  . Years of Education: N/A   Social History Main Topics  . Smoking status: Current Every Day Smoker -- 0.50 packs/day for 3 years    Types: Cigarettes  . Smokeless tobacco: Never Used  . Alcohol Use: No  . Drug Use: No  . Sexual Activity: No   Other Topics Concern  . None   Social History Narrative   Additional Social History:    Pain Medications: See PTA Prescriptions: See PTA Over the Counter: See PTA History of alcohol / drug use?: No history of alcohol / drug abuse Longest period of sobriety (when/how long): No history of use                    Sleep: Fair  Appetite:  Fair  Current Medications: Current Facility-Administered Medications  Medication Dose Route Frequency Provider Last Rate Last Dose  . acetaminophen (TYLENOL) tablet 650 mg  650 mg Oral Q6H PRN [name], MD   650 mg at 08/06/15 1205  . alum & mag hydroxide-simeth (MAALOX/MYLANTA) 200-200-20 MG/5ML suspension 30 mL  30 mL Oral Q4H PRN [name], MD      . amLODipine (NORVASC) tablet 2.5 mg  2.5 mg Oral Daily [name], MD   2.5 mg at 08/06/15 0907  . benztropine (COGENTIN) tablet 1 mg  1 mg Oral QHS [name], MD  1 mg at 08/05/15 2155  . docusate sodium (COLACE) capsule 200 mg  200 mg Oral BID [name], MD   200 mg at 08/06/15 0907  . fluPHENAZine (PROLIXIN) tablet 5 mg  5 mg Oral 3 times per day [name], MD   5 mg at 08/06/15 0653  . lactulose (CHRONULAC) 10 GM/15ML solution 30 g  30 g Oral BID [name], MD   30 g at 08/06/15 0913  . levothyroxine (SYNTHROID, LEVOTHROID) tablet 75 mcg  75 mcg Oral QAC breakfast [name], MD   75 mcg at 08/06/15 [phone]  . lurasidone (LATUDA) tablet 120 mg  120 mg Oral Q breakfast [name], MD   120 mg at 08/06/15 0907  . magnesium hydroxide (MILK OF MAGNESIA) suspension 30 mL  30 mL Oral Daily PRN [name], MD       . nicotine (NICODERM CQ - dosed in mg/24 hours) patch 21 mg  21 mg Transdermal Daily Shalayne Leach B Noe Pittsley, MD   21 mg at 08/06/15 0906  . Oxcarbazepine (TRILEPTAL) tablet 150 mg  150 mg Oral BID [name], MD   150 mg at 08/06/15 1205  . polyethylene glycol (MIRALAX / GLYCOLAX) packet 17 g  17 g Oral Daily [name], MD   17 g at 08/06/15 0906  . senna (SENOKOT) tablet 17.2 mg  2 tablet Oral QHS [name], MD   17.2 mg at 08/05/15 2155  . traZODone (DESYREL) tablet 100 mg  100 mg Oral QHS [name], MD   100 mg at 08/05/15 2155  . venlafaxine XR (EFFEXOR-XR) 24 hr capsule 150 mg  150 mg Oral Q breakfast [name], MD   150 mg at 08/06/15 [id number]    Lab Results: No results found for this or any previous visit (from the past 48 hour(s)).  Blood Alcohol level:  Lab Results  Component Value Date   ETH <5 07/30/2015   ETH <5 07/13/2015    Physical Findings: AIMS: Facial and Oral Movements Muscles of Facial Expression: None, normal Lips and Perioral Area: None, normal Jaw: None, normal Tongue: None, normal,Extremity Movements Upper (arms, wrists, hands, fingers): None, normal Lower (legs, knees, ankles, toes): None, normal, Trunk Movements Neck, shoulders, hips: None, normal, Overall Severity Severity of abnormal movements (highest score from questions above): None, normal Incapacitation due to abnormal movements: None, normal Patient's awareness of abnormal movements (rate only patient's report): Aware, no distress, Dental Status Current problems with teeth and/or dentures?: No Does patient usually wear dentures?: No  CIWA:    COWS:     Musculoskeletal: Strength & Muscle Tone: within normal limits Gait & Station: normal Patient leans: N/A  Psychiatric Specialty Exam: Review of Systems  Psychiatric/Behavioral: Positive for depression and suicidal ideas.  All other systems reviewed and are negative.   Blood pressure 113/74, pulse 93, temperature 99.1 F  (37.3 C), temperature source Oral, resp. rate 16, height 5\' 6"  (1.676 m), weight 86.183 kg (190 lb), SpO2 98 %.Body mass index is 30.68 kg/(m^2).  General Appearance: Casual  Eye Contact::  Good  Speech:  Clear and Coherent  Volume:  Normal  Mood:  Depressed  Affect:  Blunt  Thought Process:  Goal Directed  Orientation:  Full (Time, Place, and Person)  Thought Content:  WDL  Suicidal Thoughts:  Yes.  with intent/plan  Homicidal Thoughts:  No  Memory:  Immediate;   Fair Recent;   Fair Remote;  Fair  Judgement:  Poor  Insight:  Lacking  Psychomotor Activity:  Decreased  Concentration:  Fair  Recall:  [employer] of Knowledge:Fair  Language: Fair  Akathisia:  No  Handed:  Right  AIMS (if indicated):     Assets:  Communication Skills Desire for Improvement Financial Resources/Insurance Housing Physical Health Resilience Social Support  ADL's:  Intact  Cognition: WNL  Sleep:  Number of Hours: 6.75   Treatment Plan Summary: Daily contact with patient to assess and evaluate symptoms and progress in treatment and Medication management   [name] is a 25 year old female with history of schizoaffective disorder admitted for worsening of depression and suicide attempt by swallowing a AA battery and a screw.   1. Suicidal ideation. The patient is able to contract for safety in the hospital.   2. Mood and psychosis. We continued Prolixin and Latuda for psychosis. We continued Lamictal and Trileptal for mood stabilization, and Effexor for depression.   3. Hypertension. She is on amlodipine.  4. Constipation. She is on bowel regimen for constipation. I am holding narcotics.  5. Hypothyroidism. She is on Synthroid.  6. Smoking. Nicotine patch is available.  7. Insomnia. She is on trazodone.  8. Social. She is incompetent adult. Her Kara Dies is the guardian. She recently moved to [place].  9. Metabolic syndrome. Lipid panel, hemoglobin A1c, TSH, and PRL were checked during  previous admission and are normal. Prolactin 15.   10. Foreign body ingestion. There are three foreign objects present in distal colon on Xray with likely anterograde progression. No bowel movement so far in spite of lactulose.  11. Disposition. She will be discharged back to her group home. She will follow up with her primary psychiatrist at [place].   Orson Slick, MD 08/06/2015, 2:26 PM

## 2015-08-06 NOTE — Plan of Care (Signed)
Problem: Alteration in thought process Goal: LTG-Patient behavior demonstrates decreased signs psychosis (Patient behavior demonstrates decreased signs of psychosis to the point the patient is safe to return home and continue treatment in an outpatient setting.)  Outcome: Progressing Less psychosis noted.      

## 2015-08-06 NOTE — Progress Notes (Addendum)
Reports goal for today is use coping skills. Patient with appropriate affect, cooperative behavior with meals, meds and plan of care. No SI/HI/SH at this time. Fair adls. Enjoys attending therapy groups today with nursing instructor and nursing students. MD into evaluate and assess. Patient with some discomfort to lower abdomin. Bowel protocol as per MD orders with foreign objects passed as of this time. Does well with one on one support and encouragement. Safety maintained.

## 2015-08-06 NOTE — Progress Notes (Signed)
Recreation Therapy Notes  Date: 03.23.17 Time: 3:00 pm Location: Craft Room  Group Topic: Leisure Education  Goal Area(s) Addresses:  Patient will identify activities for each letter of the alphabet. Patient will verbalize ability to integrate positive leisure into life post d/c. Patient will verbalize ability to use leisure as a Technical sales engineer.  Behavioral Response: Attentive, Interactive  Intervention: Leisure Alphabet  Activity: Patients were given a Leisure Air traffic controller and instructed to write a healthy leisure activity for each letter of the alphabet.  Education: LRT educated patients on what they need to participate in leisure.  Education Outcome: Acknowledges education/In group clarification offered  Clinical Observations/Feedback: Patient completed activity by writing healthy leisure activities for most of the letters. Patient contributed to group discussion by stating some of the leisure activities she wrote down.  Leonette Monarch, LRT/CTRS 08/06/2015 4:24 PM

## 2015-08-06 NOTE — Progress Notes (Signed)
D: Pt denies SI/HI/AVH. Pt is pleasant and cooperative, affect bright upon approach. . Pt appears less anxious and she is interacting with peers and staff appropriately.  A: Pt was offered support and encouragement. Pt was given scheduled medications. Pt was encouraged to attend groups. Q 15 minute checks were done for safety.  R:Pt attends groups and interacts well with peers and staff. Pt is taking medication. Pt has no complaints.Pt receptive to treatment and safety maintained on unit.

## 2015-08-07 NOTE — Plan of Care (Signed)
Problem: Alteration in thought process Goal: LTG-Patient behavior demonstrates decreased signs psychosis (Patient behavior demonstrates decreased signs of psychosis to the point the patient is safe to return home and continue treatment in an outpatient setting.)  Outcome: Progressing Patient demonstrate decreased psychosis.

## 2015-08-07 NOTE — BHH Suicide Risk Assessment (Signed)
[facility] INPATIENT:  Family/Significant Other Suicide Prevention Education  Suicide Prevention Education:  Education Completed;  Echo [phone] has been identified by the patient as the family member/significant other with whom the patient will be residing, and identified as the person(s) who will aid the patient in the event of a mental health crisis (suicidal ideations/suicide attempt).  With written consent from the patient, the family member/significant other has been provided the following suicide prevention education, prior to the and/or following the discharge of the patient.  The suicide prevention education provided includes the following:  Suicide risk factors  Suicide prevention and interventions  National Suicide Hotline telephone number  [facility] assessment telephone number  [facility] Emergency Assistance [place] and/or Residential Mobile Crisis Unit telephone number  Request made of family/significant other to:  Remove weapons (e.g., guns, rifles, knives), all items previously/currently identified as safety concern.    Remove drugs/medications (over-the-counter, prescriptions, illicit drugs), all items previously/currently identified as a safety concern.  The family member/significant other verbalizes understanding of the suicide prevention education information provided.  The family member/significant other agrees to remove the items of safety concern listed above.  Christa See, CSW Intern 08/07/2015, 4:57 PM

## 2015-08-07 NOTE — Plan of Care (Signed)
Problem: Diagnosis: Increased Risk For Suicide Attempt Goal: LTG-Patient Will Report Improved Mood and Deny Suicidal LTG (by discharge) Patient will report improved mood and deny suicidal ideation.  Outcome: Progressing Denies suicidal ideations.     
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
## 2015-08-07 NOTE — BHH Group Notes (Signed)
[facility] LCSW Aftercare Discharge Planning Group Note   08/07/2015 9:15 AM  ?  Participation Quality: Alert, Appropriate and Oriented   Mood/Affect: Depressed and Flat   Depression Rating: Unknown, pt did not report  Anxiety Rating:  Unknown, pt did not report  Thoughts of Suicide: Pt denies SI/HI   Will you contract for safety? Yes   Current AVH: Pt denies   Plan for Discharge/Comments: Pt attended discharge planning group and actively participated in group. CSW provided pt with today's workbook. Pt shared she loooks forward to returning home in order to spend time with her husband and children.  Pt shared she intends to find assistance through medication management, therapy and the support of her family.  CSW actively validated the pt's opinions and provided feedback.  Pt was polite and cooperative with the CSW and other group members and focused and attentive to the topics discussed and the sharing of others.   Transportation Means: Pt reports access to transportation   Supports: Pt lists family and her group home as her primary supports     [name]. Kent Riendeau, MSW, LCSWA, LCAS

## 2015-08-07 NOTE — BHH Group Notes (Signed)
[facility] Group Notes:  (Nursing/MHT/Case Management/Adjunct)  Date:  08/07/2015  Time:  11:53 AM  Type of Therapy:  Movement Therapy  Participation Level:  Active  Participation Quality:  Attentive  Affect:  Appropriate  Cognitive:  Alert  Insight:  Appropriate  Engagement in Group:  Engaged  Modes of Intervention:  Activity and Socialization  Summary of Progress/Problems:  Melanie Bell [name] 08/07/2015, 11:53 AM

## 2015-08-07 NOTE — BHH Group Notes (Signed)
[place] LCSW Group Therapy   08/07/2015 1pm  Type of Therapy: Group Therapy   Participation Level: Did Not Attend. Patient invited to participate but declined.    Melanie Bell. Melanie Bell, MSW, LCSWA, LCAS

## 2015-08-07 NOTE — Progress Notes (Signed)
  D) Patient pleasant and cooperative upon my assessment. Patient completed Patient Self Inventory, reports slept "6.45," and  appetite is "good." Patient rates depression as  1 /10, patient rates hopeless feelings as0 /10. Patient denies SI/HI, denies A/V hallucinations.   A) Patient offered support and encouragement, patient encouraged to discuss feelings/concerns with staff. Patient verbalized understanding. Patient monitored Q15 minutes for safety. Patient met with MD  to discuss today's goals and plan of care.  R) Patient visible in milieu, attending groups in day room and meals in dining room. Patient appropriate with staff and peers.   Patient taking medications as ordered. Will continue to monitor.

## 2015-08-07 NOTE — Progress Notes (Signed)
D: Pt denies SI/HI/AVH. Pt is pleasant and cooperative, affect is flat but brightens upon approach. Pt is getting medication for bowel prep/ swallowing foreign objects no distress noted denies pain and discomfort. Pt appears less anxious and she is interacting with peers and staff appropriately.  A: Pt was offered support and encouragement. Pt was given scheduled medications. Pt was encouraged to attend groups. Q 15 minute checks were done for safety.  R:Pt attends groups and interacts well with peers and staff. Pt is taking medication. Pt has no complaints.Pt receptive to treatment and safety maintained on unit.

## 2015-08-08 DIAGNOSIS — F250 Schizoaffective disorder, bipolar type: Principal | ICD-10-CM

## 2015-08-08 DIAGNOSIS — R45851 Suicidal ideations: Secondary | ICD-10-CM

## 2015-08-08 NOTE — Progress Notes (Signed)
Patient ID: Melanie Bell, female   DOB: [birth year], 25 y.o.   MRN: [id number] [facility] MD Progress Note  08/08/2015 12:21 PM Melanie Bell  MRN:  [id number]  Subjective:    [name] is a 25 year old female with a history of schizoaffective disorder who also swallows foreign objects in duress. She has been stable on her current regimen. She returned to the hospital, and was briefly admitted to medicine, after she swallowed 3 foreign objects including an AA batery and a screw. She has not pass it yet. She is on Lactulose. I do not know if the patient can be trusted completely so I will order abdomen X-ray on Sunday to see if there is progression.   Patient was seen in her bed this morning. She stated she was sleepy but she was cooperative to talk to this clinician. She did endorse the above the symptoms and the swelling of the foreign objects and brought her to the hospital. She stated that she was very stressed and she does swallow things when she is stressed. States that she had also not been taking her medications. She has been restarted on her medications and states her mood is improving. She is sleeping okay and eating well. Denies any side effects from medications. She is somewhat engaged with the group activities and with staff. It does not appear that patient  realizes the full impact of her actions.  Principal Problem: Schizoaffective disorder, bipolar type ([place]) Diagnosis:   Patient Active Problem List   Diagnosis Date Noted  . Foreign body ingestion [T18.9XXA] 07/31/2015  . Schizoaffective disorder, bipolar type ([place]) [F25.0] 11/06/2014  . Tardive dyskinesia [G24.01] 10/06/2014  . Tobacco use disorder [F17.200] 09/30/2014  . Hypothyroidism [E03.9] 09/29/2014  . Hypertension [I10] 09/29/2014  . Constipation [K59.00] 09/29/2014   Total Time spent with patient: 20 minutes    Past Psychiatric History: Schizoaffective disorder.  Past Medical History:  Past Medical History   Diagnosis Date  . Hallucinations 09/30/2014    Sizoaffective  . Depression   . Anxiety   . Hypertension   . Tardive dyskinesia 10/2014    recent onset  . GERD (gastroesophageal reflux disease)   . Hyperlipidemia     Past Surgical History  Procedure Laterality Date  . Wisdom tooth extraction    . Esophagogastroduodenoscopy N/A 11/28/2014    Procedure: ESOPHAGOGASTRODUODENOSCOPY (EGD);  Surgeon: [name], MD;  Location: [facility] ENDOSCOPY;  Service: Endoscopy;  Laterality: N/A;  . Abdominal surgery      "years ago" to remove foreign objects  . Breast lumpectomy     Family History:  Family History  Problem Relation Age of Onset  . Depression Mother   . Hypertension Mother    Family Psychiatric  History: Mother with depression. Social History:  History  Alcohol Use No     History  Drug Use No    Social History   Social History  . Marital Status: Single    Spouse Name: N/A  . Number of Children: N/A  . Years of Education: N/A   Social History Main Topics  . Smoking status: Current Every Day Smoker -- 0.50 packs/day for 3 years    Types: Cigarettes  . Smokeless tobacco: Never Used  . Alcohol Use: No  . Drug Use: No  . Sexual Activity: No   Other Topics Concern  . None   Social History Narrative   Additional Social History:    Pain Medications: See PTA Prescriptions: See PTA Over the Counter:  See PTA History of alcohol / drug use?: No history of alcohol / drug abuse Longest period of sobriety (when/how long): No history of use                    Sleep: Fair  Appetite:  Fair  Current Medications: Current Facility-Administered Medications  Medication Dose Route Frequency Provider Last Rate Last Dose  . acetaminophen (TYLENOL) tablet 650 mg  650 mg Oral Q6H PRN [name], MD   650 mg at 08/07/15 1502  . alum & mag hydroxide-simeth (MAALOX/MYLANTA) 200-200-20 MG/5ML suspension 30 mL  30 mL Oral Q4H PRN [name], MD      . amLODipine  (NORVASC) tablet 2.5 mg  2.5 mg Oral Daily [name], MD   2.5 mg at 08/08/15 1013  . benztropine (COGENTIN) tablet 1 mg  1 mg Oral QHS [name], MD   1 mg at 08/07/15 2205  . docusate sodium (COLACE) capsule 200 mg  200 mg Oral BID [name], MD   200 mg at 08/08/15 1012  . fluPHENAZine (PROLIXIN) tablet 5 mg  5 mg Oral 3 times per day [name], MD   5 mg at 08/08/15 0659  . lactulose (CHRONULAC) 10 GM/15ML solution 30 g  30 g Oral BID [name], MD   30 g at 08/08/15 1014  . levothyroxine (SYNTHROID, LEVOTHROID) tablet 75 mcg  75 mcg Oral QAC breakfast [name], MD   75 mcg at 08/08/15 0659  . lurasidone (LATUDA) tablet 120 mg  120 mg Oral Q breakfast [name], MD   120 mg at 08/08/15 0816  . magnesium hydroxide (MILK OF MAGNESIA) suspension 30 mL  30 mL Oral Daily PRN [name], MD      . nicotine (NICODERM CQ - dosed in mg/24 hours) patch 21 mg  21 mg Transdermal Daily [name] [name], MD   21 mg at 08/08/15 1016  . Oxcarbazepine (TRILEPTAL) tablet 150 mg  150 mg Oral BID [name], MD   150 mg at 08/08/15 1012  . polyethylene glycol (MIRALAX / GLYCOLAX) packet 17 g  17 g Oral Daily [name], MD   17 g at 08/08/15 1013  . senna (SENOKOT) tablet 17.2 mg  2 tablet Oral QHS [name], MD   17.2 mg at 08/07/15 2205  . traZODone (DESYREL) tablet 100 mg  100 mg Oral QHS [name], MD   100 mg at 08/07/15 2205  . venlafaxine XR (EFFEXOR-XR) 24 hr capsule 150 mg  150 mg Oral Q breakfast [name], MD   150 mg at 08/08/15 [id number]    Lab Results: No results found for this or any previous visit (from the past 48 hour(s)).  Blood Alcohol level:  Lab Results  Component Value Date   ETH <5 07/30/2015   ETH <5 07/13/2015    Physical Findings: AIMS: Facial and Oral Movements Muscles of Facial Expression: None, normal Lips and Perioral Area: None, normal Jaw: None, normal Tongue: None, normal,Extremity Movements Upper (arms,  wrists, hands, fingers): None, normal Lower (legs, knees, ankles, toes): None, normal, Trunk Movements Neck, shoulders, hips: None, normal, Overall Severity Severity of abnormal movements (highest score from questions above): None, normal Incapacitation due to abnormal movements: None, normal Patient's awareness of abnormal movements (rate only patient's report): Aware, no distress, Dental Status Current problems with teeth and/or dentures?: No Does patient usually wear dentures?: No  CIWA:    COWS:     Musculoskeletal: Strength & Muscle Tone: within normal limits Gait & Station: normal Patient leans: N/A  Psychiatric Specialty Exam: Review of Systems  All other systems reviewed and are negative.   Blood pressure 109/69, pulse 85, temperature 98.6 F (37 C), temperature source Oral, resp. rate 20, height 5\' 6"  (1.676 m), weight 190 lb (86.183 kg), SpO2 98 %.Body mass index is 30.68 kg/(m^2).  General Appearance: Casual  Eye Contact::  Good  Speech:  Clear and Coherent  Volume:  Normal  Mood:  Anxious and Depressed  Affect:  Appropriate  Thought Process:  Goal Directed  Orientation:  Full (Time, Place, and Person)  Thought Content:  WDL  Suicidal Thoughts:  Yes.  with intent/plan  Homicidal Thoughts:  No  Memory:  Immediate;   Fair Recent;   Fair Remote;   Fair  Judgement:  Impaired  Insight:  Shallow  Psychomotor Activity:  Normal  Concentration:  Fair  Recall:  [place]  Language: Fair  Akathisia:  No  Handed:  Right  AIMS (if indicated):     Assets:  Communication Skills Desire for Improvement Financial Resources/Insurance Housing Physical Health Resilience Social Support  ADL's:  Intact  Cognition: WNL  Sleep:  Number of Hours: 8   Treatment Plan Summary: Daily contact with patient to assess and evaluate symptoms and progress in treatment and Medication management   [name] is a 25 year old female with a history of  schizoaffective disorder admitted for worsening of depression and suicide attempt by swallowing an AA battery and a screw.   1. Suicidal ideation. The patient is able to contract for safety in the hospital.   2. Mood and psychosis. We continued Prolixin and Latuda for psychosis. We continued Trileptal for mood stabilization, and Effexor for depression.   3. Hypertension. She is on amlodipine.  4. Constipation. She is on bowel regimen for constipation. I am holding narcotics.  5. Hypothyroidism. She is on Synthroid.  6. Smoking. Nicotine patch is available.  7. Insomnia. She is on trazodone.  8. Social. She is incompetent adult. Her Kara Dies is the guardian. She recently moved to [place].  9. Metabolic syndrome. Lipid panel, hemoglobin A1c, TSH, and PRL were checked during previous admission and are normal. Prolactin 15.   10. Foreign body ingestion. There are three foreign objects present in distal colon on Xray with likely anterograde progression. No bowel movement so far in spite of lactulose. Will take another Xray on Sunday.  11. Disposition. She will be discharged back to her group home. She will follow up with her primary psychiatrist at [facility].    Elvin So, MD 08/08/2015, 12:21 PM

## 2015-08-08 NOTE — Plan of Care (Signed)
Problem: Alteration in thought process Goal: LTG-Patient verbalizes understanding importance med regimen (Patient verbalizes understanding of importance of medication regimen and need to continue outpatient care.)  Outcome: Progressing Patient is compliant with medication regimen and expresses understanding of the importance of her prescribed medications

## 2015-08-08 NOTE — Plan of Care (Signed)
Problem: Alteration in thought process Goal: LTG-Patient has not harmed self or others in at least 2 days Outcome: Progressing Pt safe on the unit at this time      

## 2015-08-08 NOTE — [facility] Group Notes (Signed)
[facility] LCSW Group Therapy  08/08/2015 3:52 PM  Type of Therapy:  Group Therapy  Participation Level:  Minimal  Participation Quality:  Attentive  Affect:  Flat  Cognitive:  Alert  Insight:  Limited  Engagement in Therapy:  Limited  Modes of Intervention:  Discussion, Education, Socialization and Support  Summary of Progress/Problems: Pt will identify unhealthy thoughts and how they impact their emotions and behavior. Pt will be encouraged to discuss these thoughts, emotions and behaviors with the group. Pt attended group and stayed the entire time. She sat quietly and listened to other group members.   [place] MSW, [place]  08/08/2015, 3:52 PM

## 2015-08-08 NOTE — Progress Notes (Signed)
D:  Patient is alert and oriented on the unit this shift.  Patient attended and actively participated in some groups today.  Patient denies suicidal ideation, homicidal ideation, auditory or visual hallucinations at the present time. Patient spent most of the morning in bed.  Patient has still not has not reported having a bowel movement this shift.  Patient states her goal for today is to "stop isolating."  Patient continues to report mild abdominal discomfort. A:  Scheduled medications are administered to patient as per MD orders.  Emotional support and encouragement are provided.  Patient is maintained on q.15 minute safety checks.  Patient is informed to notify staff with questions or concerns. R:  No adverse medication reactions are noted.  Patient is cooperative with medication administration and treatment plan today.  Patient is receptive, calm and cooperative on the unit at this time.  Patient interacts minimally with others on the unit this shift.  Patient contracts for safety at this time.  Patient remains safe at this time.

## 2015-08-08 NOTE — Progress Notes (Signed)
D: Pt denies SI/HI/AVH. Pt is pleasant and cooperative. Pt appears less anxious and she is interacting with peers and staff appropriately, on bowel protocol/ swallowing foreign objects.  A: Pt was offered support and encouragement. Pt was given scheduled medications. Pt was encouraged to attend groups. Q 15 minute checks were done for safety.  R:Pt attends groups and interacts well with peers and staff. Pt is taking medication. Pt has no complaints.Pt receptive to treatment and safety maintained on unit.

## 2015-08-08 NOTE — Plan of Care (Signed)
Problem: Ineffective individual coping Goal: LTG: Patient will report a decrease in negative feelings Outcome: Progressing Patient reports feeling better today than she did yesterday     

## 2015-08-08 NOTE — Plan of Care (Signed)
Problem: Alteration in thought process Goal: LTG-Patient behavior demonstrates decreased signs psychosis (Patient behavior demonstrates decreased signs of psychosis to the point the patient is safe to return home and continue treatment in an outpatient setting.)  Outcome: Progressing Decreased psychosis noted.

## 2015-08-08 NOTE — [facility] Group Notes (Signed)
[facility] Group Notes:  (Nursing/MHT/Case Management/Adjunct)  Date:  08/08/2015  Time:  3:34 AM  Type of Therapy:  Group Therapy  Participation Level:  Active  Participation Quality:  Appropriate and Attentive  Affect:  Appropriate  Cognitive:  Appropriate  Insight:  Appropriate  Engagement in Group:  Engaged  Modes of Intervention:  Discussion  Summary of Progress/Problems:  Laurel Smeltz Joy [place] 08/08/2015, 3:34 AM

## 2015-08-08 NOTE — Progress Notes (Signed)
D: Pt denies SI/HI/AVH. Pt is pleasant and cooperative. Pt stated that she felt better due to decreased voices and decreased SI . Pt stated she has not gone to the bathroom lately and stated that GoLytely has helped in the past.   A: Pt was offered support and encouragement. Pt was given scheduled medications. Pt was encourage to attend groups. Q 15 minute checks were done for safety.   R:Pt attends groups and interacts well with peers and staff. Pt is taking medication. Pt has no complaints.Pt receptive to treatment and safety maintained on unit.

## 2015-08-08 NOTE — Plan of Care (Signed)
Problem: Alteration in thought process Goal: LTG-Patient has not harmed self or others in at least 2 days Outcome: Progressing Patient has not harmed herself in at least 2 days

## 2015-08-09 ENCOUNTER — Inpatient Hospital Stay: Payer: MEDICAID

## 2015-08-09 IMAGING — CR DG ABDOMEN 1V
1 series · 2 of 2 positions shown · non-contrast
Comparison: [DATE].

CLINICAL DATA: Continued surveillance of ingested foreign objects.

EXAM:
ABDOMEN - 1 VIEW

[Series 3: t abdomen supine · 0.14mm/px · 2 of 2 slices shown]
[im 1/2]
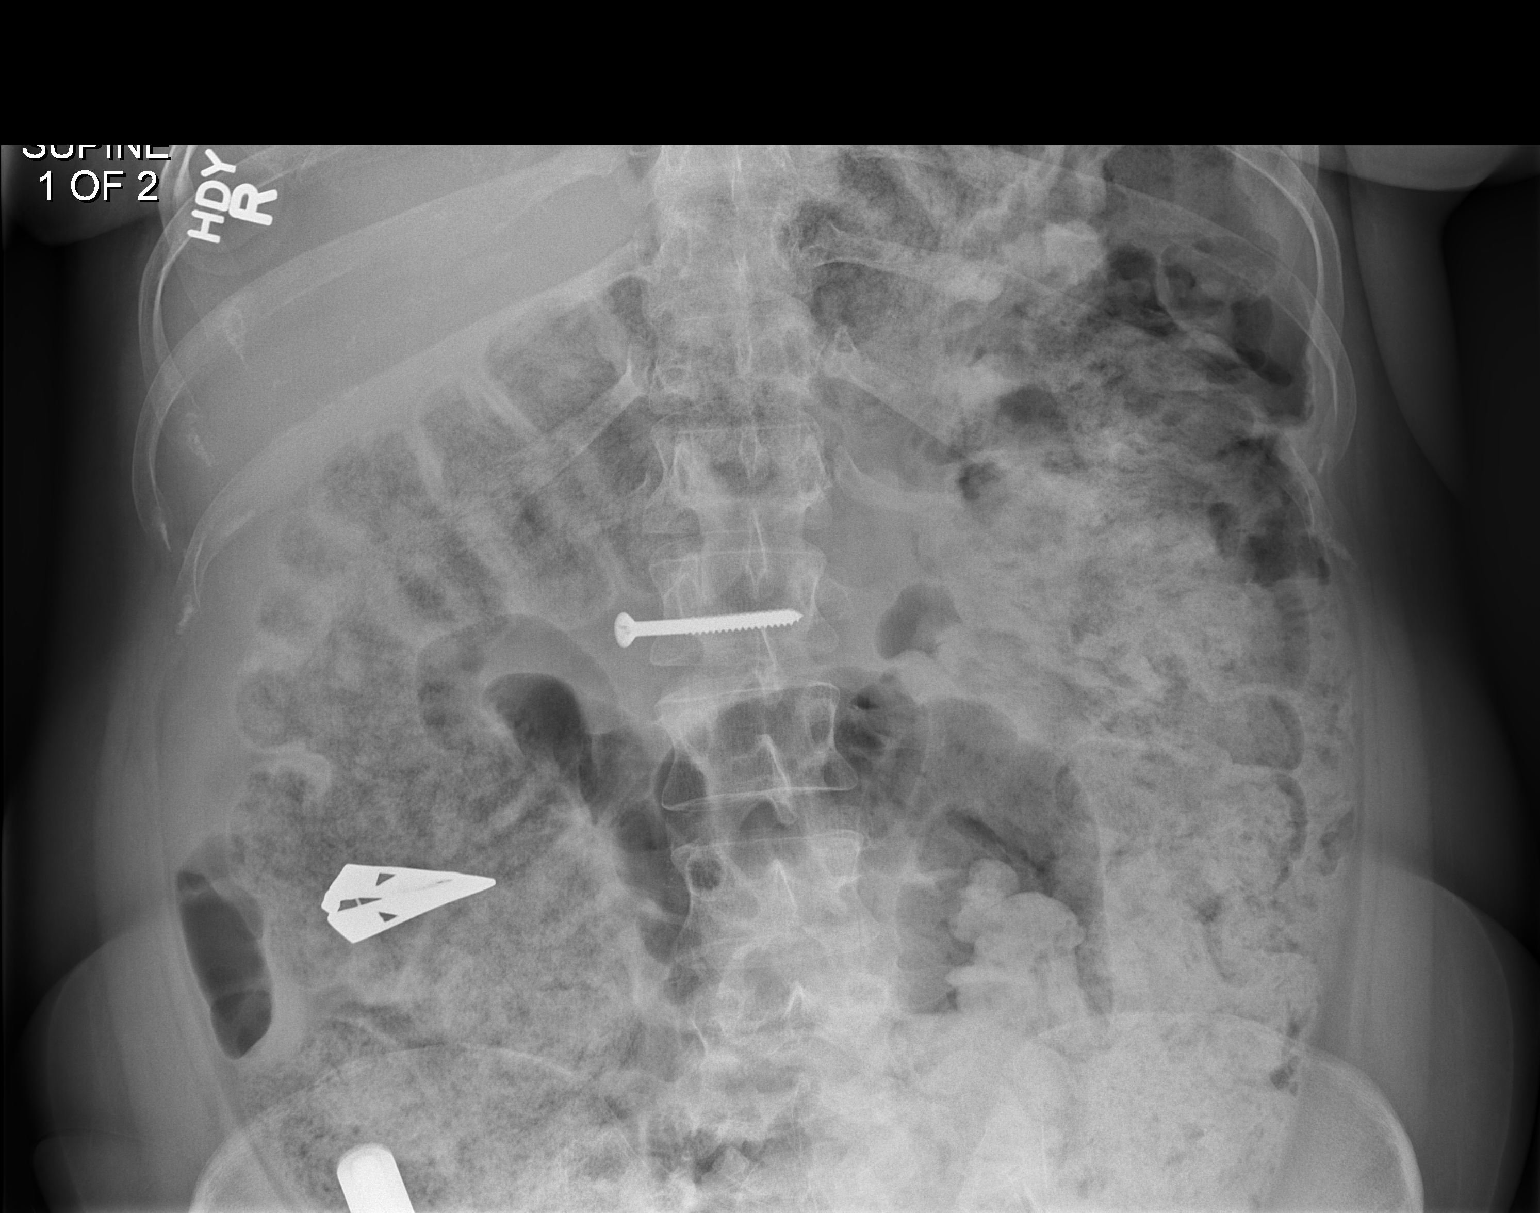
[im 2/2]
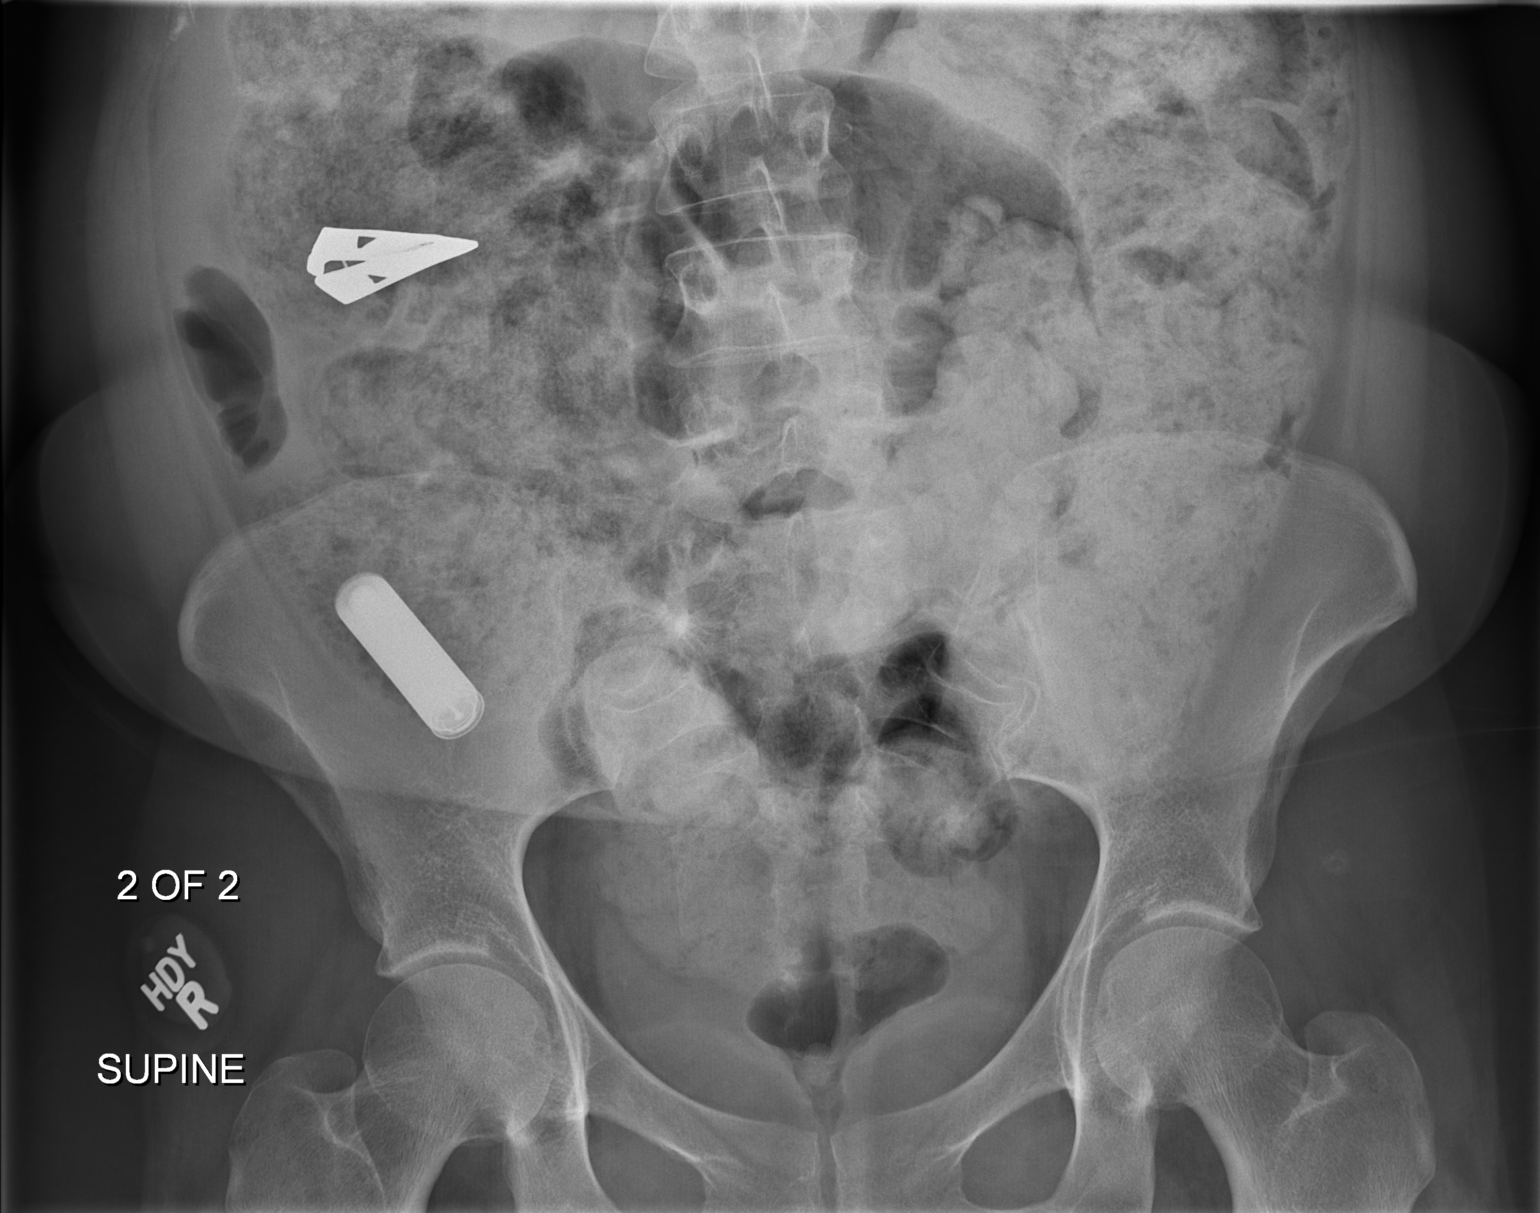

[2 of 2 positions shown; findings below may reference images not displayed]

FINDINGS: A screw, battery, and earring are seen in different locations in the
abdomen compared with the prior film. There is extensive stool
throughout the colon but no bowel obstruction. No visible free air
on these supine radiographs.

The battery and earring may lie within the RIGHT colon. Unknown
location of the screw, as it appears inferior to the visible
transverse colon. This object may lie within small bowel.
IMPRESSION: Constipation. Ingested foreign objects are transiting through the
bowel. See discussion above.

## 2015-08-09 NOTE — Plan of Care (Signed)
Problem: Alteration in thought process Goal: LTG-Patient has not harmed self or others in at least 2 days Outcome: Progressing Patient denies SI/HI at this time

## 2015-08-09 NOTE — Progress Notes (Signed)
D:  Patient is alert and oriented, Patient complains of pain in abdomen which she believes due to her inability to have a bowel movement.  Patient was given a PRN dose of Milk of Magnesia,  Patient states she mentioned GOlytely to the MD  No order given. Will follow up with MD.   Patient inquired about her xray results on her abdomen.  Advised patient that no blockage was noted.  A: Pt was offered support and encouragement. Pt was given scheduled medications. Pt was encourage to attend groups. Q 15 minute checks were done for safety.   R:Pt attends groups and interacts well with peers and staff. Pt is taking medication. Pt has no complaints.Pt receptive to treatment and safety maintained on unit.

## 2015-08-09 NOTE — Progress Notes (Signed)
Patient ID: DEMYRA BANASIK, female   DOB: 09-04-1990, 25 y.o.   MRN: [name] [facility] MD Progress Note  08/09/2015 12:38 PM JULANA CAMBRIDGE  MRN:  [name]  Subjective:    [name] is a 25 year old female with a history of schizoaffective disorder who also swallows foreign objects in duress. She has been stable on her current regimen. She returned to the hospital, and was briefly admitted to medicine, after she swallowed 3 foreign objects including an AA batery and a screw. She has not pass it yet. She is on Lactulose. I do not know if the patient can be trusted completely so I will order abdomen X-ray on Sunday to see if there is progression.   Patient was seen  this morning. She was up and about today. She took a shower and was dressed in her regular clothes. She reports that her mood is improving. She denied any auditory or visual hallucinations. She did complain of her lower abdomen hurting. She denies any blood in her stools or urine. She looks much better as compared to yesterday. Reports she had an x-ray done this morning. Has been compliant on the unit and denies any side effects to her medications.   Principal Problem: Schizoaffective disorder, bipolar type ([place]) Diagnosis:   Patient Active Problem List   Diagnosis Date Noted  . Foreign body ingestion [T18.9XXA] 07/31/2015  . Schizoaffective disorder, bipolar type ([place]) [F25.0] 11/06/2014  . Tardive dyskinesia [G24.01] 10/06/2014  . Tobacco use disorder [F17.200] 09/30/2014  . Hypothyroidism [E03.9] 09/29/2014  . Hypertension [I10] 09/29/2014  . Constipation [K59.00] 09/29/2014   Total Time spent with patient: 20 minutes    Past Psychiatric History: Schizoaffective disorder.  Past Medical History:  Past Medical History  Diagnosis Date  . Hallucinations 09/30/2014    Sizoaffective  . Depression   . Anxiety   . Hypertension   . Tardive dyskinesia 10/2014    recent onset  . GERD (gastroesophageal reflux disease)   .  Hyperlipidemia     Past Surgical History  Procedure Laterality Date  . Wisdom tooth extraction    . Esophagogastroduodenoscopy N/A 11/28/2014    Procedure: ESOPHAGOGASTRODUODENOSCOPY (EGD);  Surgeon: [name], MD;  Location[facility] END[facility]COPY;  Service: Endoscopy;  Laterality: N/A;  . Abdominal surgery      "years ago" to remove foreign objects  . Breast lumpectomy     Family History:  Family History  Problem Relation Age of Onset  . Depression Mother   . Hypertension Mother    Family Psychiatric  History: Mother with depression. Social History:  History  Alcohol Use No     History  Drug Use No    Social History   Social History  . Marital Status: Single    Spouse Name: N/A  . Number of Children: N/A  . Years of Education: N/A   Social History Main Topics  . Smoking status: Current Every Day Smoker -- 0.50 packs/day for 3 years    Types: Cigarettes  . Smokeless tobacco: Never Used  . Alcohol Use: No  . Drug Use: No  . Sexual Activity: No   Other Topics Concern  . None   Social History Narrative   Additional Social History:    Pain Medications: See PTA Prescriptions: See PTA Over the Counter: See PTA History of alcohol / drug use?: No history of alcohol / drug abuse Longest period of sobriety (when/how long): No history of use  Sleep: Fair  Appetite:  Fair  Current Medications: Current Facility-Administered Medications  Medication Dose Route Frequency Provider Last Rate Last Dose  . acetaminophen (TYLENOL) tablet 650 mg  650 mg Oral Q6H PRN [name], MD   650 mg at 08/08/15 1355  . alum & mag hydroxide-simeth (MAALOX/MYLANTA) 200-200-20 MG/5ML suspension 30 mL  30 mL Oral Q4H PRN [name], MD      . amLODipine (NORVASC) tablet 2.5 mg  2.5 mg Oral Daily [name], MD   2.5 mg at 08/09/15 1024  . benztropine (COGENTIN) tablet 1 mg  1 mg Oral QHS [name], MD   1 mg at 08/08/15 2141  . docusate sodium  (COLACE) capsule 200 mg  200 mg Oral BID [name], MD   200 mg at 08/09/15 1023  . fluPHENAZine (PROLIXIN) tablet 5 mg  5 mg Oral 3 times per day [name], MD   5 mg at 08/09/15 0644  . lactulose (CHRONULAC) 10 GM/15ML solution 30 g  30 g Oral BID [name], MD   30 g at 08/09/15 1025  . levothyroxine (SYNTHROID, LEVOTHROID) tablet 75 mcg  75 mcg Oral QAC breakfast [name], MD   75 mcg at 08/09/15 [phone]  . lurasidone (LATUDA) tablet 120 mg  120 mg Oral Q breakfast [name], MD   120 mg at 08/09/15 0831  . magnesium hydroxide (MILK OF MAGNESIA) suspension 30 mL  30 mL Oral Daily PRN [name], MD      . nicotine (NICODERM CQ - dosed in mg/24 hours) patch 21 mg  21 mg Transdermal Daily [name] [name], MD   21 mg at 08/09/15 1025  . Oxcarbazepine (TRILEPTAL) tablet 150 mg  150 mg Oral BID [name], MD   150 mg at 08/09/15 1025  . polyethylene glycol (MIRALAX / GLYCOLAX) packet 17 g  17 g Oral Daily [name], MD   17 g at 08/09/15 1025  . senna (SENOKOT) tablet 17.2 mg  2 tablet Oral QHS [name], MD   17.2 mg at 08/08/15 2142  . traZODone (DESYREL) tablet 100 mg  100 mg Oral QHS [name], MD   100 mg at 08/08/15 2142  . venlafaxine XR (EFFEXOR-XR) 24 hr capsule 150 mg  150 mg Oral Q breakfast [name], MD   150 mg at 08/09/15 0831    Lab Results: No results found for this or any previous visit (from the past 48 hour(s)).  Blood Alcohol level:  Lab Results  Component Value Date   ETH <5 07/30/2015   ETH <5 07/13/2015    Physical Findings: AIMS: Facial and Oral Movements Muscles of Facial Expression: None, normal Lips and Perioral Area: None, normal Jaw: None, normal Tongue: None, normal,Extremity Movements Upper (arms, wrists, hands, fingers): None, normal Lower (legs, knees, ankles, toes): None, normal, Trunk Movements Neck, shoulders, hips: None, normal, Overall Severity Severity of abnormal movements (highest score  from questions above): None, normal Incapacitation due to abnormal movements: None, normal Patient's awareness of abnormal movements (rate only patient's report): Aware, no distress, Dental Status Current problems with teeth and/or dentures?: No Does patient usually wear dentures?: No  CIWA:    COWS:     Musculoskeletal: Strength & Muscle Tone: within normal limits Gait & Station: normal Patient leans: N/A  Psychiatric Specialty Exam: Review of Systems  Constitutional: Negative.   HENT: Negative.   Eyes: Negative.  Respiratory: Negative.   Cardiovascular: Negative.   Gastrointestinal: Positive for abdominal pain.  Genitourinary: Negative.   Musculoskeletal: Negative.   Skin: Negative.   Neurological: Negative.   Endo/Heme/Allergies: Negative.   Psychiatric/Behavioral: Positive for depression. The patient is nervous/anxious.   All other systems reviewed and are negative.   Blood pressure 106/80, pulse 86, temperature 98.2 F (36.8 C), temperature source Oral, resp. rate 20, height 5\' 6"  (1.676 m), weight 190 lb (86.183 kg), SpO2 98 %.Body mass index is 30.68 kg/(m^2).  General Appearance: Casual  Eye Contact::  Good  Speech:  Clear and Coherent  Volume:  Normal  Mood:  Anxious and Depressed  Affect:  Appropriate  Thought Process:  Goal Directed  Orientation:  Full (Time, Place, and Person)  Thought Content:  WDL  Suicidal Thoughts:  Yes.  with intent/plan  Homicidal Thoughts:  No  Memory:  Immediate;   Fair Recent;   Fair Remote;   Fair  Judgement:  Impaired  Insight:  Shallow  Psychomotor Activity:  Normal  Concentration:  Fair  Recall:  [place]  Language: Fair  Akathisia:  No  Handed:  Right  AIMS (if indicated):     Assets:  Communication Skills Desire for Improvement Financial Resources/Insurance Housing Physical Health Resilience Social Support  ADL's:  Intact  Cognition: WNL  Sleep:  Number of Hours: 7.3   Treatment Plan  Summary: Daily contact with patient to assess and evaluate symptoms and progress in treatment and Medication management   [name] is a 24 year old female with a history of schizoaffective disorder admitted for worsening of depression and suicide attempt by swallowing an AA battery and a screw.   1. Suicidal ideation. The patient is able to contract for safety in the hospital.   2. Mood and psychosis. We continued Prolixin and Latuda for psychosis. We continued Trileptal for mood stabilization, and Effexor for depression.   3. Hypertension. She is on amlodipine.  4. Constipation. She is on bowel regimen for constipation. I am holding narcotics.  5. Hypothyroidism. She is on Synthroid.  6. Smoking. Nicotine patch is available.  7. Insomnia. She is on trazodone.  8. Social. She is incompetent adult. Her Kara Dies is the guardian. She recently moved to [place].  9. Metabolic syndrome. Lipid panel, hemoglobin A1c, TSH, and PRL were checked during previous admission and are normal. Prolactin 15.   10. Foreign body ingestion. There are three foreign objects present in distal colon on Xray with likely anterograde progression. No bowel movement so far in spite of lactulose.  She had an x-ray of her abdomen done this morning and we will follow up on the results.  11. Disposition. She will be discharged back to her group home. She will follow up with her primary psychiatrist at [place].    Elvin So, MD 08/09/2015, 12:38 PM

## 2015-08-09 NOTE — Progress Notes (Signed)
Spoke with Dr Einar Grad regarding ordering  Golytely for this patient, MD advised that she would advise the treatment team and let them decide given the patients current xray results

## 2015-08-09 NOTE — Discharge Summary (Signed)
[facility] [place] [name]    MR#:  [name]  DATE OF BIRTH:  [birth year]  DATE OF ADMISSION:  07/30/2015 ADMITTING PHYSICIAN: Saundra Shelling, MD  DATE OF DISCHARGE: 08/05/2015 12:15 PM  PRIMARY CARE PHYSICIAN: Casilda Carls, MD    ADMISSION DIAGNOSIS:  Generalized abdominal pain [R10.84] Foreign body ingestion, initial encounter [T18.9XXA]  DISCHARGE DIAGNOSIS:  Principal Problem:   Foreign body ingestion Active Problems:   Hypothyroidism   Hypertension   Constipation   Schizoaffective disorder, bipolar type ([place])   SECONDARY DIAGNOSIS:   Past Medical History  Diagnosis Date  . Hallucinations 09/30/2014    Sizoaffective  . Depression   . Anxiety   . Hypertension   . Tardive dyskinesia 10/2014    recent onset  . GERD (gastroesophageal reflux disease)   . Hyperlipidemia     HOSPITAL COURSE:  25 y.o. female with a known history of Depression, schizoaffective disorder, prior foreign body ingestions, constipation, tardive dyskinesia, hypothyroidism, tobacco abuse and hypertension admitted due to ingestion of a nail, a battery and an earring in an attempt to commit suicide.   1. Abdominal pain, constipation, swallowing foreign bodies including an earring, a battery. patient having good bowel movements with the stool softners. Abd x-ray still showing FB as is but not blocking so no intervention needed per surgery. 2. Essential hypertension: controlled. 3. Hypothyroidism unspecified continue levothyroxine 4. Schizoaffective disease versus bipolar depression. Being transferred to inpt psych  DISCHARGE CONDITIONS:   stable  CONSULTS OBTAINED:  Treatment Team:  [name], MD Clayburn Pert, MD  DRUG ALLERGIES:   Allergies  Allergen Reactions  . Betadine [Povidone Iodine] Other (See Comments)    Reaction:  Unknown   . Shellfish-Derived Products Other (See Comments)    Reaction:  Unknown   .  Iodine Rash    DISCHARGE MEDICATIONS:   Discharge Medication List as of 08/05/2015 12:16 PM    CONTINUE these medications which have NOT CHANGED   Details  acetaminophen (TYLENOL) 325 MG tablet Take 2 tablets (650 mg total) by mouth every 6 (six) hours as needed for mild pain., Starting 06/19/2015, Until Discontinued, Print    alum & mag hydroxide-simeth (MAALOX/MYLANTA) 200-200-20 MG/5ML suspension Take 30 mLs by mouth every 4 (four) hours as needed for indigestion., Starting 06/19/2015, Until Discontinued, Print    amLODipine (NORVASC) 2.5 MG tablet Take 1 tablet (2.5 mg total) by mouth daily., Starting 06/19/2015, Until Discontinued, Print    benztropine (COGENTIN) 1 MG tablet Take 1 tablet (1 mg total) by mouth at bedtime., Starting 06/19/2015, Until Discontinued, Print    docusate sodium (COLACE) 100 MG capsule Take 2 capsules (200 mg total) by mouth 2 (two) times daily., Starting 06/19/2015, Until Discontinued, Print    fluPHENAZine (PROLIXIN) 5 MG tablet Take 1 tablet (5 mg total) by mouth 3 (three) times daily., Starting 06/19/2015, Until Discontinued, Print    levothyroxine (SYNTHROID, LEVOTHROID) 75 MCG tablet Take 1 tablet (75 mcg total) by mouth daily before breakfast., Starting 06/19/2015, Until Discontinued, Print    Lurasidone HCl 120 MG TABS Take 1 tablet (120 mg total) by mouth daily with supper., Starting 06/19/2015, Until Discontinued, Print    OXcarbazepine (TRILEPTAL) 150 MG tablet Take 1 tablet (150 mg total) by mouth 2 (two) times daily., Starting 06/19/2015, Until Discontinued, Print    traZODone (DESYREL) 50 MG tablet Take 1 tablet (50 mg total) by mouth at bedtime., Starting 06/19/2015, Until Discontinued, Print  venlafaxine XR (EFFEXOR-XR) 150 MG 24 hr capsule Take 1 capsule (150 mg total) by mouth daily with breakfast., Starting 06/19/2015, Until Discontinued, Print    ibuprofen (ADVIL,MOTRIN) 800 MG tablet Take 800 mg by mouth every 8 (eight) hours as needed., Until  Discontinued, Historical Med         DISCHARGE INSTRUCTIONS:    DIET:  Regular diet  DISCHARGE CONDITION:  Good  ACTIVITY:  Activity as tolerated  OXYGEN:  Home Oxygen: No.   Oxygen Delivery: room air  DISCHARGE LOCATION:  home   If you experience worsening of your admission symptoms, develop shortness of breath, life threatening emergency, suicidal or homicidal thoughts you must seek medical attention immediately by calling 911 or calling your MD immediately  if symptoms less severe.  You Must read complete instructions/literature along with all the possible adverse reactions/side effects for all the Medicines you take and that have been prescribed to you. Take any new Medicines after you have completely understood and accpet all the possible adverse reactions/side effects.   Please note  You were cared for by a hospitalist during your hospital stay. If you have any questions about your discharge medications or the care you received while you were in the hospital after you are discharged, you can call the unit and asked to speak with the hospitalist on call if the hospitalist that took care of you is not available. Once you are discharged, your primary care physician will handle any further medical issues. Please note that NO REFILLS for any discharge medications will be authorized once you are discharged, as it is imperative that you return to your primary care physician (or establish a relationship with a primary care physician if you do not have one) for your aftercare needs so that they can reassess your need for medications and monitor your lab values.    On the day of Discharge:  VITAL SIGNS:  Blood pressure 109/66, pulse 75, temperature 98.2 F (36.8 C), temperature source Oral, resp. rate 16, height 5\' 6"  (1.676 m), weight 87.136 kg (192 lb 1.6 oz), SpO2 98 %.  PHYSICAL EXAMINATION:  GENERAL:  25 y.o.-year-old patient lying in the bed with no acute distress.  EYES:  Pupils equal, round, reactive to light and accommodation. No scleral icterus. Extraocular muscles intact.  HEENT: Head atraumatic, normocephalic. Oropharynx and nasopharynx clear.  NECK:  Supple, no jugular venous distention. No thyroid enlargement, no tenderness.  LUNGS: Normal breath sounds bilaterally, no wheezing, rales,rhonchi or crepitation. No use of accessory muscles of respiration.  CARDIOVASCULAR: S1, S2 normal. No murmurs, rubs, or gallops.  ABDOMEN: Soft, non-tender, non-distended. Bowel sounds present. No organomegaly or mass.  EXTREMITIES: No pedal edema, cyanosis, or clubbing.  NEUROLOGIC: Cranial nerves II through XII are intact. Muscle strength 5/5 in all extremities. Sensation intact. Gait not checked.  PSYCHIATRIC: The patient is alert and oriented x 3.  SKIN: No obvious rash, lesion, or ulcer.  DATA REVIEW:   CBC No results for input(s): WBC, HGB, HCT, PLT in the last 168 hours.  Chemistries   Recent Labs Lab 08/04/15 0540  CREATININE 0.76    Cardiac Enzymes No results for input(s): TROPONINI in the last 168 hours.  Microbiology Results  Results for orders placed or performed during the hospital encounter of 07/30/15  MRSA PCR Screening     Status: None   Collection Time: 08/01/15  3:25 AM  Result Value Ref Range Status   MRSA by PCR NEGATIVE NEGATIVE Final    Comment:  The GeneXpert MRSA Assay (FDA approved for NASAL specimens only), is one component of a comprehensive MRSA colonization surveillance program. It is not intended to diagnose MRSA infection nor to guide or monitor treatment for MRSA infections.     Follow-up Information    Follow up with Casilda Carls, MD. Schedule an appointment as soon as possible for a visit in 1 week.   Specialty:  Internal Medicine   Why:  [facility] Discharge F/UP   Contact information:   [place] [id number] [phone]       Management plans discussed with the patient,  family and they are in agreement.  CODE STATUS: FULL CODE  TOTAL TIME TAKING CARE OF THIS PATIENT: 45 minutes.    [facility], Shaelee Forni M.D on 08/09/2015 at 10:21 AM  Between 7am to 6pm - Pager - [phone]  After 6pm go to [email or web address] - password [email or web address] [place] [facility]  Office  [phone]  CC: Primary care physician; Casilda Carls, MD   Note: This dictation was prepared with Dragon dictation along with smaller phrase technology. Any transcriptional errors that result from this process are unintentional.

## 2015-08-09 NOTE — BHH Group Notes (Signed)
[place] LCSW Group Therapy  08/09/2015 2:12 PM  Type of Therapy:  Group Therapy  Participation Level:  Minimal  Participation Quality:  Attentive  Affect:  Flat  Cognitive:  Alert  Insight:  Limited  Engagement in Therapy:  Limited  Modes of Intervention:  Discussion, Education, Socialization and Support  Summary of Progress/Problems: Balance in life: Patients will discuss the concept of balance and how it looks and feels to be unbalanced. Pt will identify areas in their life that is unbalanced and ways to become more balanced.  Pt attended group and stayed the entire time. She sat quietly and listened to other group members share.   [place] MSW, [place]  08/09/2015, 2:12 PM

## 2015-08-09 NOTE — BHH Group Notes (Signed)
[place] Group Notes:  (Nursing/MHT/Case Management/Adjunct)  Date:  08/09/2015  Time:  1:41 AM  Type of Therapy:  Group Therapy  Participation Level:  Active  Participation Quality:  Appropriate  Affect:  Appropriate  Cognitive:  Appropriate  Insight:  Appropriate  Engagement in Group:  Engaged  Modes of Intervention:  n/a  Summary of Progress/Problems:  Melanie Bell 08/09/2015, 1:41 AM

## 2015-08-10 MED ORDER — SENNA 8.6 MG PO TABS: 2.0000 | ORAL_TABLET | Freq: Every day | ORAL | Status: DC

## 2015-08-10 MED ORDER — MAGNESIUM CITRATE PO SOLN
1.0000 | Freq: Once | ORAL | Status: AC
  Administered 2015-08-11: 1 via ORAL
  Filled 2015-08-10: qty 296

## 2015-08-10 MED ORDER — POLYETHYLENE GLYCOL 3350 17 G PO PACK: 17.0000 g | PACK | Freq: Every day | ORAL | Status: DC

## 2015-08-10 MED ORDER — LACTULOSE 10 GM/15ML PO SOLN: 30.0000 g | Freq: Two times a day (BID) | ORAL | Status: DC

## 2015-08-10 NOTE — BHH Suicide Risk Assessment (Signed)
[facility] Discharge Suicide Risk Assessment   Principal Problem: Schizoaffective disorder, bipolar type [facility]) Discharge Diagnoses:  Patient Active Problem List   Diagnosis Date Noted  . Foreign body ingestion [T18.9XXA] 07/31/2015  . Schizoaffective disorder, bipolar type ([place]) [F25.0] 11/06/2014  . Tardive dyskinesia [G24.01] 10/06/2014  . Tobacco use disorder [F17.200] 09/30/2014  . Hypothyroidism [E03.9] 09/29/2014  . Hypertension [I10] 09/29/2014  . Constipation [K59.00] 09/29/2014    Total Time spent with patient: 30 minutes  Musculoskeletal: Strength & Muscle Tone: within normal limits Gait & Station: normal Patient leans: N/A  Psychiatric Specialty Exam: Review of Systems  Gastrointestinal: Positive for diarrhea.  All other systems reviewed and are negative.   Blood pressure 114/71, pulse 79, temperature 99.2 F (37.3 C), temperature source Oral, resp. rate 20, height 5\' 6"  (1.676 m), weight 86.183 kg (190 lb), SpO2 98 %.Body mass index is 30.68 kg/(m^2).  General Appearance: Casual  Eye Contact::  Good  Speech:  Clear and [id number]  Volume:  Normal  Mood:  Euthymic  Affect:  Appropriate  Thought Process:  Goal Directed  Orientation:  Full (Time, Place, and Person)  Thought Content:  WDL  Suicidal Thoughts:  No  Homicidal Thoughts:  No  Memory:  Immediate;   Fair Recent;   Fair Remote;   Fair  Judgement:  Impaired  Insight:  Shallow  Psychomotor Activity:  Normal  Concentration:  Fair  Recall:  [place]  Language: Fair  Akathisia:  No  Handed:  Right  AIMS (if indicated):     Assets:  Communication Skills Desire for Improvement Financial Resources/Insurance Housing Physical Health Resilience Social Support  Sleep:  Number of Hours: 7  Cognition: WNL  ADL's:  Intact   Mental Status Per Nursing Assessment::   On Admission:     Demographic Factors:  Adolescent or young adult and Caucasian  Loss Factors: NA  Historical  Factors: Prior suicide attempts, Family history of mental illness or substance abuse and Impulsivity  Risk Reduction Factors:   Sense of responsibility to family, Living with another person, especially a relative, Positive social support and Positive therapeutic relationship  Continued Clinical Symptoms:  Schizophrenia:   Depressive state Less than 36 years old  Cognitive Features That Contribute To Risk:  None    Suicide Risk:  Minimal: No identifiable suicidal ideation.  Patients presenting with no risk factors but with morbid ruminations; may be classified as minimal risk based on the severity of the depressive symptoms  Follow-up Information    Follow up with [facility]. Go on 08/13/2015.   Why:  1:20 pm, for , Hospital Follow up, Outpatient Medication Management, Please reschedule if unable to make appointment.   Contact information:   [place] recommendations:  Activity:  as tolerated. Diet:  low sodium heart healthy. Other:  keep follow up appointment.  Orson Slick, MD 08/10/2015, 10:17 PM

## 2015-08-10 NOTE — Progress Notes (Signed)
Patient had a 5th bowel movement and no foreign objects were found

## 2015-08-10 NOTE — Plan of Care (Signed)
Problem: Alteration in thought process Goal: LTG-Patient is able to perceive the environment accurately Outcome: Progressing Patient appears to perceive her environment accurately at this time

## 2015-08-10 NOTE — Progress Notes (Signed)
Patient had a 4th bowel movement and no foreign objects were found.

## 2015-08-10 NOTE — BHH Group Notes (Signed)
[place] LCSW Group Therapy   08/10/2015 1 pm Type of Therapy: Group Therapy   Participation Level: Active   Participation Quality: Attentive, Sharing and Supportive   Affect: Depressed and Flat   Cognitive: Alert and Oriented   Insight: Developing/Improving and Engaged   Engagement in Therapy: Developing/Improving and Engaged   Modes of Intervention: Clarification, Confrontation, Discussion, Education, Exploration,  Limit-setting, Orientation, Problem-solving, Rapport Building, Art therapist, Socialization and Support   Summary of Progress/Problems: Pt identified obstacles faced currently and processed barriers involved in overcoming these obstacles. Pt identified steps necessary for overcoming these obstacles and explored motivation (internal and external) for facing these difficulties head on. Pt further identified one area of concern in their lives and chose a goal to focus on for today. Pt shared the major obstacle she faces in her life is becoming "stressed out".  Pt shared she uses her faith and talks to her church pastor as the steps she uses to achieve her goal of living a stress-free life and to avoid feelings of anger and frustration that can result from her wanting to give up.  CSW actively validated the pt's opinion and provided feedback.  Pt was polite and cooperative with the CSW and other group members and focused and attentive to the topics discussed and the sharing of others.  Melanie Bell. [name], LCSWA, LCAS  08/10/15

## 2015-08-10 NOTE — Plan of Care (Signed)
Problem: Alteration in thought process Goal: STG-Patient is able to sleep at least 6 hours per night Outcome: Progressing Patient is sleeping at least 6 hours per night currently

## 2015-08-10 NOTE — Progress Notes (Addendum)
[facility] MD Progress Note  08/10/2015 7:22 PM Melanie Bell  MRN:  [id number]  Subjective:  Melanie Bell feels better. Depression and anxiety have resolved. There is only minimal pain in the left abdomen. Unfortunately, she did not pass the battery as of yet. She tolerates medications well. Good group participation. I spoke with the surgeon who did not see a reason to get involed and the hospitalist who suggested follow up with gastroenterologist after discharge.  Principal Problem: Schizoaffective disorder, bipolar type ([place]) Diagnosis:   Patient Active Problem List   Diagnosis Date Noted  . Foreign body ingestion [T18.9XXA] 07/31/2015  . Schizoaffective disorder, bipolar type ([place]) [F25.0] 11/06/2014  . Tardive dyskinesia [G24.01] 10/06/2014  . Tobacco use disorder [F17.200] 09/30/2014  . Hypothyroidism [E03.9] 09/29/2014  . Hypertension [I10] 09/29/2014  . Constipation [K59.00] 09/29/2014   Total Time spent with patient: 20 minutes  Past Psychiatric History: depression, anxiety, psychosis.  Past Medical History:  Past Medical History  Diagnosis Date  . Hallucinations 09/30/2014    Sizoaffective  . Depression   . Anxiety   . Hypertension   . Tardive dyskinesia 10/2014    recent onset  . GERD (gastroesophageal reflux disease)   . Hyperlipidemia     Past Surgical History  Procedure Laterality Date  . Wisdom tooth extraction    . Esophagogastroduodenoscopy N/A 11/28/2014    Procedure: ESOPHAGOGASTRODUODENOSCOPY (EGD);  Surgeon: [name], MD;  Location: [facility] ENDOSCOPY;  Service: Endoscopy;  Laterality: N/A;  . Abdominal surgery      "years ago" to remove foreign objects  . Breast lumpectomy     Family History:  Family History  Problem Relation Age of Onset  . Depression Mother   . Hypertension Mother    Family Psychiatric  History: mother with depression. Social History:  History  Alcohol Use No     History  Drug Use No    Social History   Social History   . Marital Status: Single    Spouse Name: N/A  . Number of Children: N/A  . Years of Education: N/A   Social History Main Topics  . Smoking status: Current Every Day Smoker -- 0.50 packs/day for 3 years    Types: Cigarettes  . Smokeless tobacco: Never Used  . Alcohol Use: No  . Drug Use: No  . Sexual Activity: No   Other Topics Concern  . None   Social History Narrative   Additional Social History:    Pain Medications: See PTA Prescriptions: See PTA Over the Counter: See PTA History of alcohol / drug use?: No history of alcohol / drug abuse Longest period of sobriety (when/how long): No history of use                    Sleep: Fair  Appetite:  Fair  Current Medications: Current Facility-Administered Medications  Medication Dose Route Frequency Provider Last Rate Last Dose  . acetaminophen (TYLENOL) tablet 650 mg  650 mg Oral Q6H PRN [name], MD   650 mg at 08/09/15 2143  . alum & mag hydroxide-simeth (MAALOX/MYLANTA) 200-200-20 MG/5ML suspension 30 mL  30 mL Oral Q4H PRN [name], MD      . amLODipine (NORVASC) tablet 2.5 mg  2.5 mg Oral Daily [name], MD   2.5 mg at 08/10/15 [id number]  . benztropine (COGENTIN) tablet 1 mg  1 mg Oral QHS [name], MD   1 mg at 08/09/15 2141  . docusate sodium (  COLACE) capsule 200 mg  200 mg Oral BID [name], MD   200 mg at 08/10/15 [id number]  . fluPHENAZine (PROLIXIN) tablet 5 mg  5 mg Oral 3 times per day [name], MD   5 mg at 08/10/15 1437  . lactulose (CHRONULAC) 10 GM/15ML solution 30 g  30 g Oral BID [name], MD   30 g at 08/10/15 [id number]  . levothyroxine (SYNTHROID, LEVOTHROID) tablet 75 mcg  75 mcg Oral QAC breakfast [name], MD   75 mcg at 08/10/15 [phone]  . lurasidone (LATUDA) tablet 120 mg  120 mg Oral Q breakfast [name], MD   120 mg at 08/10/15 [id number]  . magnesium hydroxide (MILK OF MAGNESIA) suspension 30 mL  30 mL Oral Daily PRN [name], MD   30 mL at 08/09/15 2143   . nicotine (NICODERM CQ - dosed in mg/24 hours) patch 21 mg  21 mg Transdermal Daily [name], MD   21 mg at 08/10/15 0926  . Oxcarbazepine (TRILEPTAL) tablet 150 mg  150 mg Oral BID [name], MD   150 mg at 08/10/15 [id number]  . polyethylene glycol (MIRALAX / GLYCOLAX) packet 17 g  17 g Oral Daily [name], MD   17 g at 08/10/15 0921  . senna (SENOKOT) tablet 17.2 mg  2 tablet Oral QHS [name], MD   17.2 mg at 08/09/15 2200  . traZODone (DESYREL) tablet 100 mg  100 mg Oral QHS [name], MD   100 mg at 08/09/15 2200  . venlafaxine XR (EFFEXOR-XR) 24 hr capsule 150 mg  150 mg Oral Q breakfast [name], MD   150 mg at 08/10/15 [id number]    Lab Results: No results found for this or any previous visit (from the past 48 hour(s)).  Blood Alcohol level:  Lab Results  Component Value Date   ETH <5 07/30/2015   ETH <5 07/13/2015    Physical Findings: AIMS: Facial and Oral Movements Muscles of Facial Expression: None, normal Lips and Perioral Area: None, normal Jaw: None, normal Tongue: None, normal,Extremity Movements Upper (arms, wrists, hands, fingers): None, normal Lower (legs, knees, ankles, toes): None, normal, Trunk Movements Neck, shoulders, hips: None, normal, Overall Severity Severity of abnormal movements (highest score from questions above): None, normal Incapacitation due to abnormal movements: None, normal Patient's awareness of abnormal movements (rate only patient's report): Aware, no distress, Dental Status Current problems with teeth and/or dentures?: No Does patient usually wear dentures?: No  CIWA:    COWS:     Musculoskeletal: Strength & Muscle Tone: within normal limits Gait & Station: normal Patient leans: N/A  Psychiatric Specialty Exam: Review of Systems  Gastrointestinal: Positive for abdominal pain.  All other systems reviewed and are negative.   Blood pressure 114/71, pulse 79, temperature 99.2 F (37.3 C), temperature  source Oral, resp. rate 20, height 5\' 6"  (1.676 m), weight 86.183 kg (190 lb), SpO2 98 %.Body mass index is 30.68 kg/(m^2).  General Appearance: Casual  Eye Contact::  Good  Speech:  Clear and Coherent  Volume:  Normal  Mood:  Anxious  Affect:  Appropriate  Thought Process:  Goal Directed  Orientation:  Full (Time, Place, and Person)  Thought Content:  WDL  Suicidal Thoughts:  No  Homicidal Thoughts:  No  Memory:  Immediate;   Fair Recent;   Fair Remote;   Fair  Judgement:  Fair  Insight:  Shallow  Psychomotor  Activity:  Normal  Concentration:  Fair  Recall:  [place]  Language: Fair  Akathisia:  No  Handed:  Right  AIMS (if indicated):     Assets:  Communication Skills Desire for Improvement Financial Resources/Insurance Housing Physical Health Resilience Social Support  ADL's:  Intact  Cognition: WNL  Sleep:  Number of Hours: 7   Treatment Plan Summary: Daily contact with patient to assess and evaluate symptoms and progress in treatment and Medication management   Melanie Bell is a 25 year old female with a history of schizoaffective disorder admitted for worsening of depression and suicide attempt by swallowing an AA battery and a screw.   1. Suicidal ideation. The patient is able to contract for safety in the hospital.   2. Mood and psychosis. We continued Prolixin and Latuda for psychosis. We continued Trileptal for mood stabilization, and Effexor for depression.   3. Hypertension. She is on amlodipine.  4. Constipation. She is on bowel regimen for constipation. I am holding narcotics.  5. Hypothyroidism. She is on Synthroid.  6. Smoking. Nicotine patch is available.  7. Insomnia. She is on trazodone.  8. Social. She is incompetent adult. Her Kara Dies is the guardian. She recently moved to [place].  9. Metabolic syndrome. Lipid panel, hemoglobin A1c, TSH, and PRL were checked during previous admission and are normal. Prolactin 15.    10. Foreign body ingestion. There are three foreign objects present in distal colon on Xray with likely anterograde progression showing on follow up study. Shehad 2 bowel movements toda but no objects expelled. We continue bowel regimen and Lactuose.  11. Disposition. She will be discharged back to her group home. She will follow up with her primary psychiatrist at [facility] or mental health and Gastroenterologist for foreign body injestion.Orson Slick, MD 08/10/2015, 7:22 PM

## 2015-08-10 NOTE — Plan of Care (Signed)
Problem: Alteration in thought process Goal: LTG-Patient has not harmed self or others in at least 2 days Outcome: Progressing No self harming behavior in 2 days.

## 2015-08-10 NOTE — Progress Notes (Signed)
D: Pt denies SI/HI/AVH. Pt is pleasant and cooperative. Pt appears less anxious and she is interacting with peers and staff appropriately, on bowel protocol/ swallowing foreign objects. Patient had small BM no foreign object noted in the faeces.  A: Pt was offered support and encouragement. Pt was given scheduled medications. Pt was encouraged to attend groups. Q 15 minute checks were done for safety.  R:Pt attends groups and interacts well with peers and staff. Pt is taking medication. Pt has no complaints.Pt receptive to treatment and safety maintained on unit.

## 2015-08-10 NOTE — Progress Notes (Signed)
Recreation Therapy Notes  Date: 03.27.17 Time: 3:00 pm Location: Craft Room  Group Topic: Self-expression  Goal Area(s) Addresses:  Patient will write at least one emotion they are experiencing. Patient will verbalize benefit of using art as a means of self-expression.  Behavioral Response: Attentive, Interactive  Intervention: Bottled Up  Activity: Patients were instructed to draw a bottle the way they viewed themselves. LRT provided examples. Patients were instructed to write the emotions they were feeling inside the bottle.  Education: LRT educated patients on different forms of self-expression.  Education Outcome: Acknowledges education/In group clarification offered   Clinical Observations/Feedback: Patient completed activity by drawing a bottle and writing her emotions. Patient contributed to group discussion by stating what emotion was most prominent, how the activity made her feel, that it was easy to see her emotions on paper, and that it felt good to put her emotions on paper.  Leonette Monarch, LRT/CTRS 08/10/2015 3:56 PM

## 2015-08-10 NOTE — Progress Notes (Signed)
Patient had a third bowel movement at 1530 and no foreign objects were found.

## 2015-08-10 NOTE — BHH Group Notes (Signed)
[facility] LCSW Aftercare Discharge Planning Group Note   08/10/2015 9:15 AM  ?  Participation Quality: Alert, Appropriate and Oriented   Mood/Affect: Appropriate  Depression Rating: 0  Anxiety Rating: 0  Thoughts of Suicide: Pt denies SI/HI   Will you contract for safety? Yes   Current AVH: Pt denies   Plan for Discharge/Comments: Pt attended discharge planning group and actively participated in group. CSW provided pt with today's workbook. Pt reports she looks forward to an increased ability to use her coping mechanisms upon discharge.  Pt shared she intends to achieve her goal of growing in her faith by reading her bible and praying more upon discharge.  Pt shared about her great support system and how she intends to use it to cope upon discharge.   CSW actively validated the pt's opinion and provided feedback.  Pt was polite and cooperative with the CSW and other group members and focused and attentive to the topics discussed and the sharing of others.   Transportation Means: Pt reports access to transportation   Supports: Pt lists family, her psychiatrist and her group homes as primary supports     [name]. Elsia [place]ota, MSW, LCSWA, LCAS

## 2015-08-10 NOTE — BHH Group Notes (Signed)
[facility] Group Notes:  (Nursing/MHT/Case Management/Adjunct)  Date:  08/10/2015  Time:  4:22 AM  Type of Therapy:  Group Therapy  Participation Level:  Did Not Attend  Participation Quality:  Appropriate  Affect:  Appropriate  Cognitive:  Appropriate  Insight:  Appropriate  Engagement in Group:  Engaged  Modes of Intervention:  n/a  Summary of Progress/Problems:  Heli Welser 08/10/2015, 4:22 AM

## 2015-08-10 NOTE — Discharge Summary (Signed)
Physician Discharge Summary Note  Patient:  Melanie Bell is an 25 y.o., female MRN:  [name] DOB:  August 31, 1990 Patient phone:  [phone] (home)  Patient address:   [place] [address],  Total Time spent with patient: 30 minutes  Date of Admission:  08/05/2015 Date of Discharge: 08/11/2015  Reason for Admission:  Suicidal ideation.  Identifying data. [name] is a 25 year old female with history of schizoaffective disorder.  Chief complaint. "I swallowed a battery."  History of present illness. Information was obtained from the patient and the chart. [name] has a long history of depression, psychosis and mood instability. She is well known to [name] and has been hospitalized at [facility] on multiple occasions. She has been relatively stable on a combination of Prolixin, Latuda, Effexor and Trileptal. She has a history of swallowing foreign objects but in the past several months she was able to come to the hospital to ask for help before she swallowed. She reports that on March 15 there was an anniversary of death of her favorite friend who committed suicide. She has been thinking about it a lot. She has been rather anxious and sad about it. She started swallowing again initially it was a triangular metal object. She came to the emergency room but was sent home she then proceeded to swallow and AA bat ery followed by a screw. She was brought to the emergency room and admitted to medical floor. Abdominal x-ray indicates the presence of 3 foreign objects as about that like the show anterograde progression and are now seen in the distal colon. The patient has a history of constipation and sometimes it is difficult for her to pass for objects that she swallowed. She came to the hospital for help. She denies symptoms of psychosis, depression or severe anxiety. She denies symptoms suggestive of bipolar mania. There are no alcohol or drugs involved.   Past  psychiatric history. The patient has been hospitalized numerous times for mood instability, suicidal thinking and swallowing of foreign objects. Recently she seems to develop better coping skills and has been able to come to the hospital before she actually swallows unfavorable objects. She has been tried on multiple medications. Seems that the current regimen keeps her stable.   Family psychiatric history. Mother with depression.   Social history. She is an incompetent adult. She lives in a group home. Her home life was disrupted when her mother divorced her stepfather. She lost contact with her step grandmother who was very important to her. Her mother who is her guardian was diagnosed with with MS. She lives in [place] now.   Principal Problem: Schizoaffective disorder, bipolar type [facility]) Discharge Diagnoses: Patient Active Problem List   Diagnosis Date Noted  . Foreign body ingestion [T18.9XXA] 07/31/2015  . Schizoaffective disorder, bipolar type ([place]) [F25.0] 11/06/2014  . Tardive dyskinesia [G24.01] 10/06/2014  . Tobacco use disorder [F17.200] 09/30/2014  . Hypothyroidism [E03.9] 09/29/2014  . Hypertension [I10] 09/29/2014  . Constipation [K59.00] 09/29/2014    Past Psychiatric History: schizoaffective disorder.  Past Medical History:  Past Medical History  Diagnosis Date  . Hallucinations 09/30/2014    Sizoaffective  . Depression   . Anxiety   . Hypertension   . Tardive dyskinesia 10/2014    recent onset  . GERD (gastroesophageal reflux disease)   . Hyperlipidemia     Past Surgical History  Procedure Laterality Date  . Wisdom tooth extraction    . Esophagogastroduodenoscopy N/A 11/28/2014  Procedure: ESOPHAGOGASTRODUODENOSCOPY (EGD);  Surgeon: [name], MD;  Location: [facility] ENDOSCOPY;  Service: Endoscopy;  Laterality: N/A;  . Abdominal surgery      "years ago" to remove foreign objects  . Breast lumpectomy     Family History:  Family History  Problem Relation  Age of Onset  . Depression Mother   . Hypertension Mother    Family Psychiatric  History: depression. Social History:  History  Alcohol Use No     History  Drug Use No    Social History   Social History  . Marital Status: Single    Spouse Name: N/A  . Number of Children: N/A  . Years of Education: N/A   Social History Main Topics  . Smoking status: Current Every Day Smoker -- 0.50 packs/day for 3 years    Types: Cigarettes  . Smokeless tobacco: Never Used  . Alcohol Use: No  . Drug Use: No  . Sexual Activity: No   Other Topics Concern  . None   Social History Narrative    Hospital Course:    [name] is a 25 year old female with a history of schizoaffective disorder admitted for worsening of depression and suicide attempt by swallowing an AA battery, an earing and a screw.   1. Suicidal ideation. This has resolved. The patient is able to contract for safety. She is forward thinking and optimistic about the future.    2. Mood and psychosis. We continued Prolixin and Latuda for psychosis, Trileptal for mood stabilization, and Effexor for depression.   3. Hypertension. She is on amlodipine.  4. Constipation. She is on bowel regimen for constipation. No narcotics were given for pain.  5. Hypothyroidism. She is on Synthroid.  6. Smoking. Nicotine patch was available.  7. Insomnia. She is on trazodone.  8. Social. She is an incompetent  adult. Her Kara Dies is the guardian. She recently moved to [place].  9. Metabolic syndrome. Lipid panel, hemoglobin A1c, TSH, and PRL were checked during previous admission and were normal. Prolactin 15.   10. Foreign body ingestion. There are three foreign objects present in the colon on X-ray with anterograde progression showing on follow up study. She had at least 5 bowel movements in the past 24 hours that were examined by nurses but no foreign objects were expelled. She will continue bowel regimen and Lactuose following  discharge.  11. Disposition. She was discharged back to her group home. She will follow up with her primary psychiatrist at [facility] for mental health and Gastroenterologist for foreign body injestion.  Physical Findings: AIMS: Facial and Oral Movements Muscles of Facial Expression: None, normal Lips and Perioral Area: None, normal Jaw: None, normal Tongue: None, normal,Extremity Movements Upper (arms, wrists, hands, fingers): None, normal Lower (legs, knees, ankles, toes): None, normal, Trunk Movements Neck, shoulders, hips: None, normal, Overall Severity Severity of abnormal movements (highest score from questions above): None, normal Incapacitation due to abnormal movements: None, normal Patient's awareness of abnormal movements (rate only patient's report): Aware, no distress, Dental Status Current problems with teeth and/or dentures?: No Does patient usually wear dentures?: No  CIWA:    COWS:     Musculoskeletal: Strength & Muscle Tone: within normal limits Gait & Station: normal Patient leans: N/A  Psychiatric Specialty Exam: Review of Systems  Gastrointestinal: Positive for diarrhea.  All other systems reviewed and are negative.   Blood pressure 114/71, pulse 79, temperature 99.2 F (37.3 C), temperature source Oral, resp. rate 20, height 5\' 6"  (1.676 m),  weight 86.183 kg (190 lb), SpO2 98 %.Body mass index is 30.68 kg/(m^2).  See SRA.                                                  Sleep:  Number of Hours: 7   Have you used any form of tobacco in the last 30 days? (Cigarettes, Smokeless Tobacco, Cigars, and/or Pipes): Yes  Has this patient used any form of tobacco in the last 30 days? (Cigarettes, Smokeless Tobacco, Cigars, and/or Pipes) Yes, Yes, A prescription for an FDA-approved tobacco cessation medication was offered at discharge and the patient refused  Blood Alcohol level:  Lab Results  Component Value Date   [facility] <5 07/30/2015   ETH <5  [id number]    Metabolic Disorder Labs:  Lab Results  Component Value Date   HGBA1C 5.2 06/16/2015   Lab Results  Component Value Date   PROLACTIN 15.8 06/16/2015   Lab Results  Component Value Date   CHOL 148 06/16/2015   TRIG 79 06/16/2015   HDL 37* 06/16/2015   CHOLHDL 4.0 06/16/2015   VLDL 16 06/16/2015   LDLCALC 95 06/16/2015   LDLCALC 114* 11/06/2014    See Psychiatric Specialty Exam and Suicide Risk Assessment completed by Attending Physician prior to discharge.  Discharge destination:  Home  Is patient on multiple antipsychotic therapies at discharge:  Yes,   Do you recommend tapering to monotherapy for antipsychotics?  Yes   Has Patient had three or more failed trials of antipsychotic monotherapy by history:  Yes,   Antipsychotic medications that previously failed include:   1.  seroquel., 2.  zyprexa. and 3.  prolixin.  Recommended Plan for Multiple Antipsychotic Therapies: Additional reason(s) for multiple antispychotic treatment:  inadequate response to a single agent.  Discharge Instructions    Diet - low sodium heart healthy    Complete by:  As directed      Increase activity slowly    Complete by:  As directed             Medication List    TAKE these medications      Indication   acetaminophen 325 MG tablet  Commonly known as:  TYLENOL  Take 2 tablets (650 mg total) by mouth every 6 (six) hours as needed for mild pain.   Indication:  Fever, Pain     alum & mag hydroxide-simeth 200-200-20 MG/5ML suspension  Commonly known as:  MAALOX/MYLANTA  Take 30 mLs by mouth every 4 (four) hours as needed for indigestion.   Indication:  upset stomsch     amLODipine 2.5 MG tablet  Commonly known as:  NORVASC  Take 1 tablet (2.5 mg total) by mouth daily.   Indication:  High Blood Pressure     benztropine 1 MG tablet  Commonly known as:  COGENTIN  Take 1 tablet (1 mg total) by mouth at bedtime.   Indication:  Extrapyramidal Reaction caused by  Medications     docusate sodium 100 MG capsule  Commonly known as:  COLACE  Take 2 capsules (200 mg total) by mouth 2 (two) times daily.   Indication:  Constipation     fluPHENAZine 5 MG tablet  Commonly known as:  PROLIXIN  Take 1 tablet (5 mg total) by mouth 3 (three) times daily.   Indication:  Schizophrenia     ibuprofen  800 MG tablet  Commonly known as:  ADVIL,MOTRIN  Take 800 mg by mouth every 8 (eight) hours as needed for moderate pain.      lactulose 10 GM/15ML solution  Commonly known as:  CHRONULAC  Take 45 mLs (30 g total) by mouth 2 (two) times daily.   Indication:  Chronic Constipation     levothyroxine 75 MCG tablet  Commonly known as:  SYNTHROID, LEVOTHROID  Take 1 tablet (75 mcg total) by mouth daily before breakfast.   Indication:  Underactive Thyroid     Lurasidone HCl 120 MG Tabs  Take 1 tablet (120 mg total) by mouth daily with supper.   Indication:  Schizophrenia     OXcarbazepine 150 MG tablet  Commonly known as:  TRILEPTAL  Take 1 tablet (150 mg total) by mouth 2 (two) times daily.   Indication:  Manic-Depression     polyethylene glycol packet  Commonly known as:  MIRALAX / GLYCOLAX  Take 17 g by mouth daily.   Indication:  Constipation     senna 8.6 MG Tabs tablet  Commonly known as:  SENOKOT  Take 2 tablets (17.2 mg total) by mouth at bedtime.   Indication:  Emptying of the Bowel     traZODone 50 MG tablet  Commonly known as:  DESYREL  Take 1 tablet (50 mg total) by mouth at bedtime.   Indication:  Trouble Sleeping     venlafaxine XR 150 MG 24 hr capsule  Commonly known as:  EFFEXOR-XR  Take 1 capsule (150 mg total) by mouth daily with breakfast.   Indication:  Major Depressive Disorder           Follow-up Information    Follow up with [facility]. Go on 08/13/2015.   Why:  1:20 pm, for , Hospital Follow up, Outpatient Medication Management, Please reschedule if unable to make appointment.   Contact information:   [place] [phone] FAX      Follow-up recommendations:  Activity:  as tolerated. Diet:  low sodium heart healthy. Other:  keep follow up appointments.  Comments:    Signed: Orson Slick, MD 08/10/2015, 10:29 PM

## 2015-08-10 NOTE — [facility] Group Notes (Signed)
[place] Group Notes:  (Nursing/MHT/Case Management/Adjunct)  Date:  08/10/2015  Time:  3:13 PM  Type of Therapy:  Psychoeducational Skills  Participation Level:  Did Not Attend  Adela Lank [facility] 08/10/2015, 3:13 PM

## 2015-08-10 NOTE — Progress Notes (Signed)
D:  Patient is alert and oriented on the unit this shift.  Patient attended and actively participated in groups today.  Patient denies suicidal ideation, homicidal ideation, auditory or visual hallucinations at the present time.  Patient reported two bowel movements today which were examined and contained no foreign objects. A:  Scheduled medications are administered to patient as per MD orders.  Emotional support and encouragement are provided.  Patient is maintained on q.15 minute safety checks.  Patient is informed to notify staff with questions or concerns. R:  No adverse medication reactions are noted.  Patient is cooperative with medication administration and treatment plan today.  Patient is receptive, calm and cooperative on the unit at this time.  Patient interacts well with others on the unit this shift.  Patient contracts for safety at this time.  Patient remains safe at this time.

## 2015-08-11 ENCOUNTER — Inpatient Hospital Stay: Payer: MEDICAID

## 2015-08-11 IMAGING — CR DG ABDOMEN 2V
1 series · 3 of 3 positions shown · non-contrast
Comparison: [DATE]

CLINICAL DATA: Foreign body alimentary tract.

EXAM:
ABDOMEN - 2 VIEW

[Series 1: dg abd 2 views · 0.14mm/px · 3 of 3 slices shown]
[im 1/3]
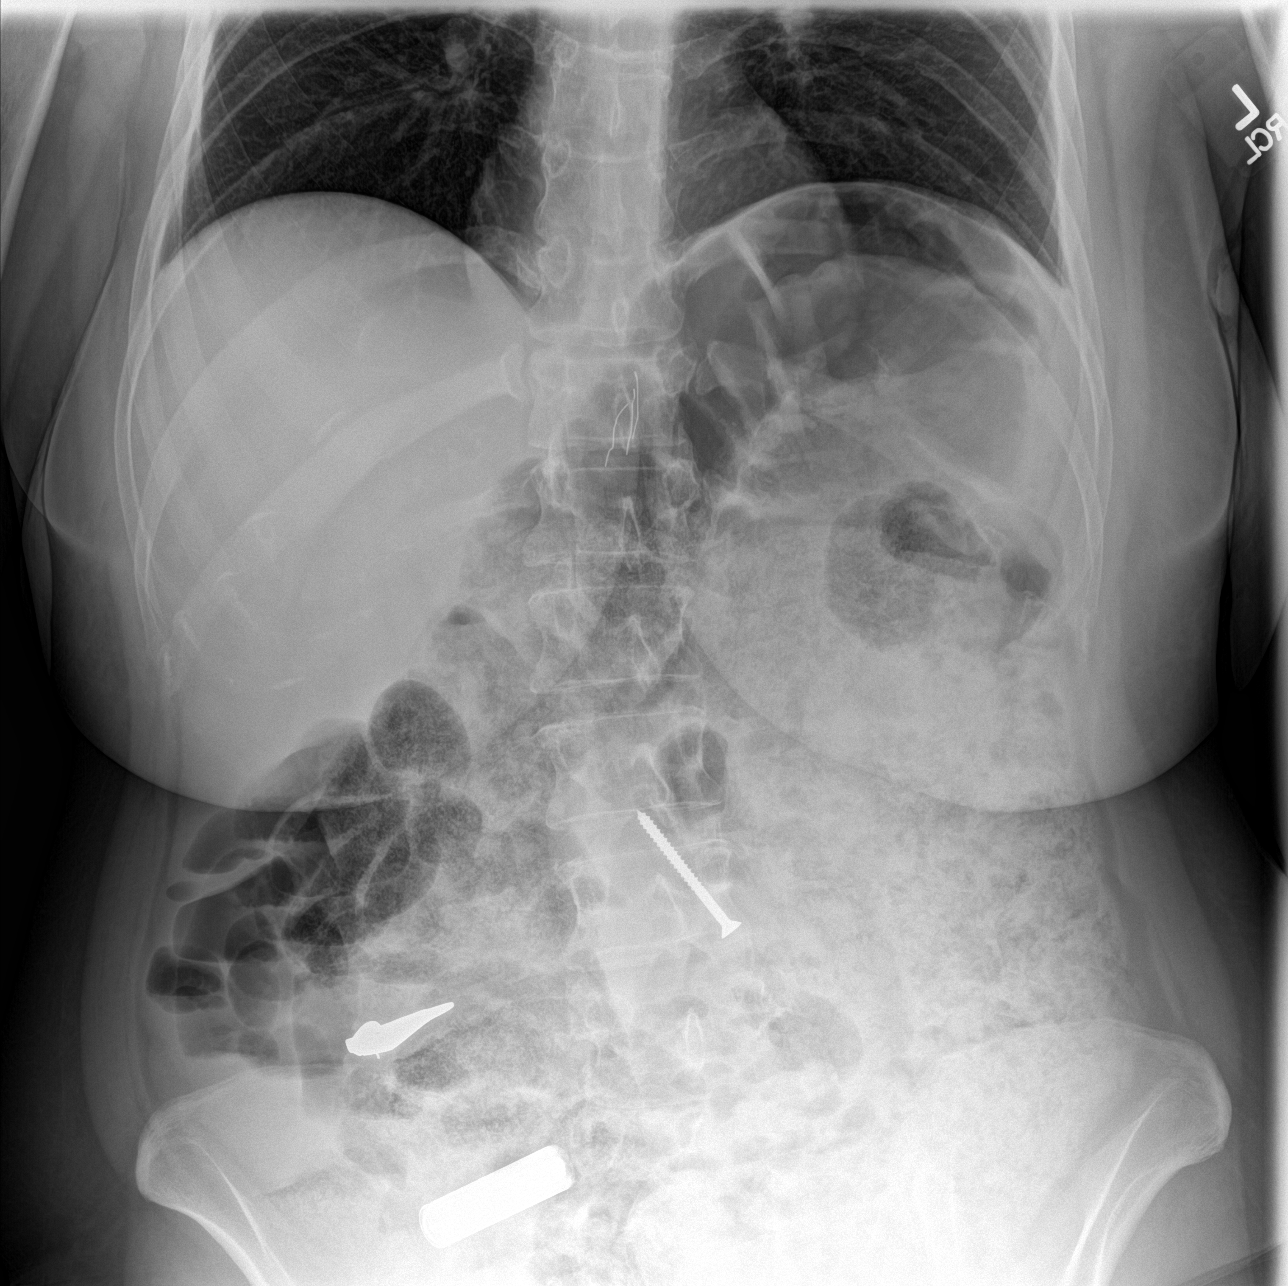
[im 2/3]
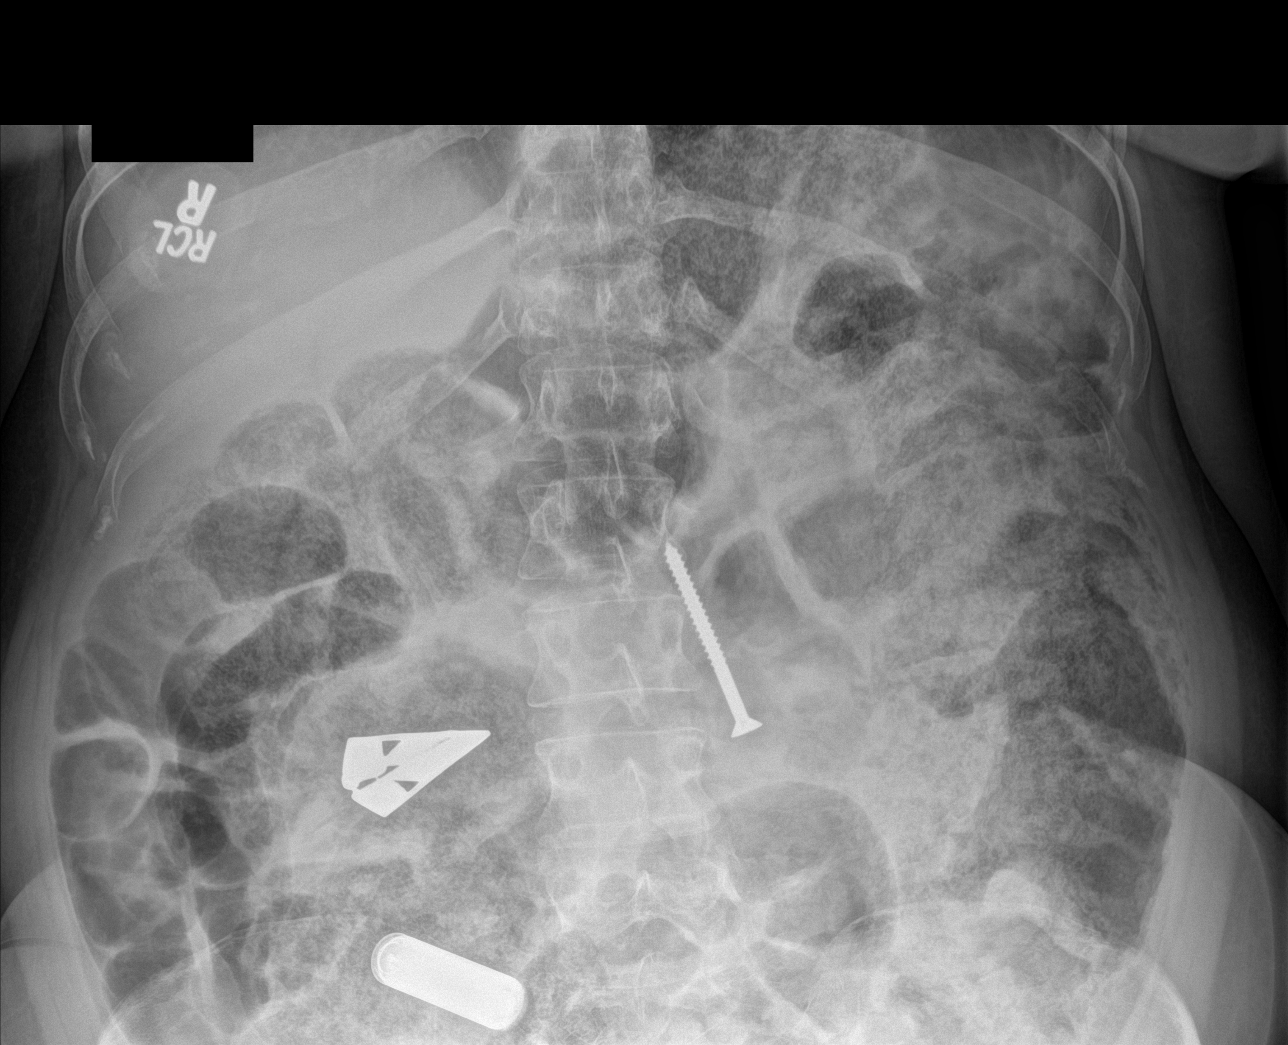
[im 3/3]
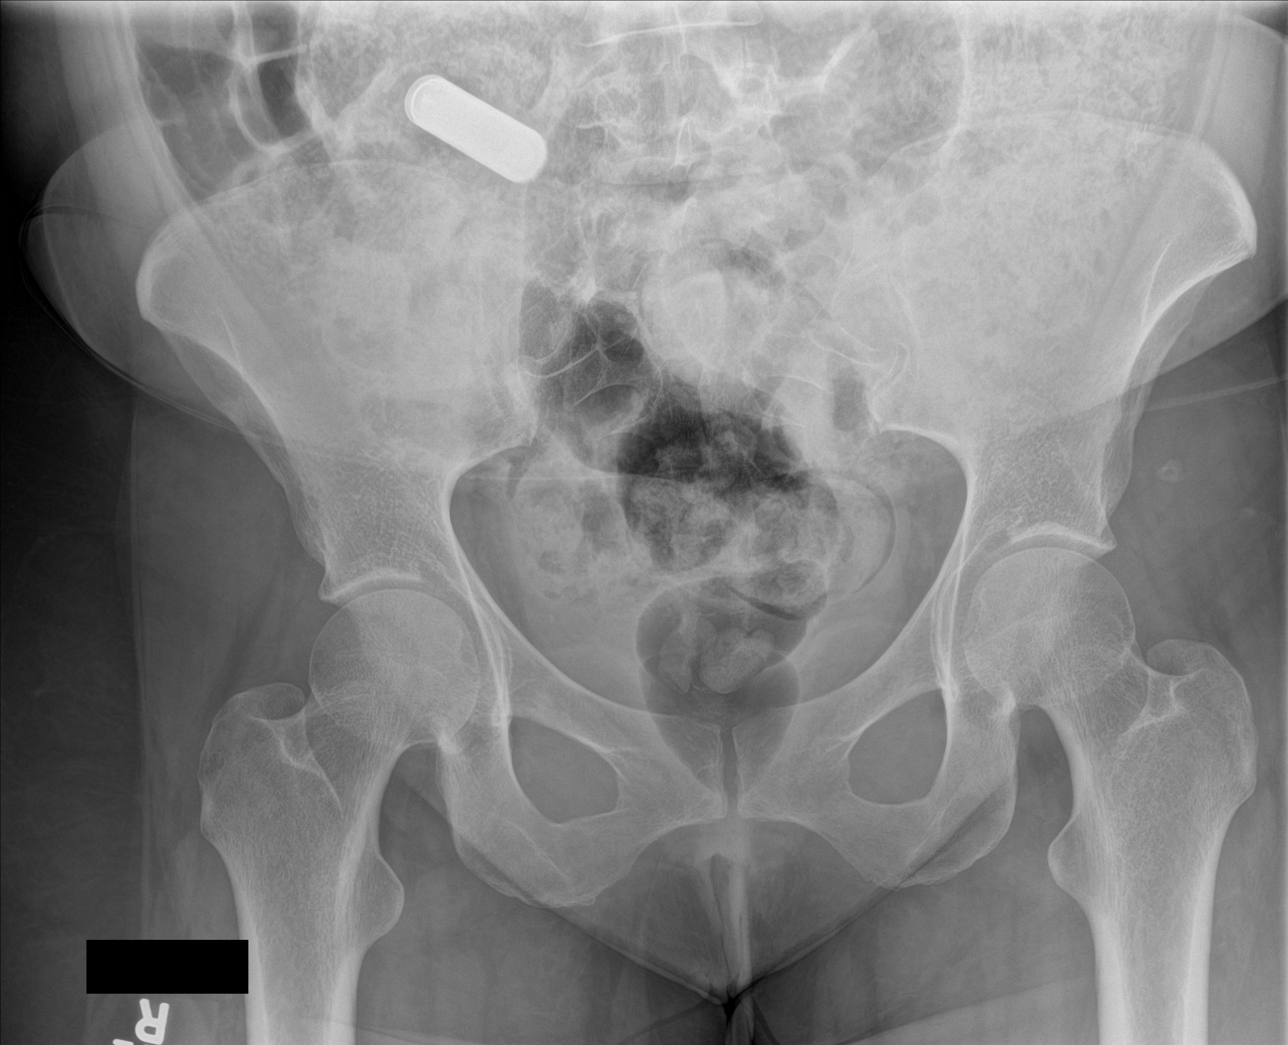

[3 of 3 positions shown; findings below may reference images not displayed]

FINDINGS: There is moderate fecal retention throughout the colon without
evidence of obstruction. No dilated small bowel loops. No evidence
of free peritoneal air. Metallic objects compatible with ingested
battery and earring are again present over the right colon. Screw is
projected over the mid abdomen just left of midline likely in small
bowel. Small metallic wires projected in the region of the
gastroesophageal junction slightly lower than previously seen.
Remainder of the exam is unchanged.
IMPRESSION: Ingested metallic objects as described. Moderate fecal retention
throughout the colon. No evidence of obstruction or free peritoneal
air.

## 2015-08-11 NOTE — Consult Note (Addendum)
Please see full GI consult by Ms Melanie Bell.  Melanie Bell a 25 yo f with mutiple admissions in the pase for ingested foreign bodies, most passed without surgical intervention, but also with 2 surgeries as well as several endoscopies.  Most recent ingestions were on 3/16, including a Screw, an earring and a battery.  These have been observed by x-ray near daily since that time and appear to be stable in position without movement for several days. Two of these objects are sharp and one possibly corrosive.  They are noe in a position for endoscopic approach and will probably need surgical approach for removal.  Several abdominal films also show a filamentous object near the GE junction.  Melanie Bell denies swallowing a fourth object, and freely admits swallowing the three other objects noted.   Patient has mild lower abdominal discomfort though no evidence of perforation currently.    Recommend clear liquid diet only, surgical consult. Will obtasin a lateral abdominal film to better localize the possible filamentous  Object.

## 2015-08-11 NOTE — [facility] Group Notes (Signed)
[facility] LCSW Group Therapy   08/11/2015 11:00 am  Type of Therapy: Group Therapy   Participation Level: Active   Participation Quality: Attentive, Sharing and Supportive   Affect: Appropriate  Cognitive: Alert and Oriented   Insight: Developing/Improving and Engaged   Engagement in Therapy: Developing/Improving and Engaged   Modes of Intervention: Clarification, Confrontation, Discussion, Education, Exploration,  Limit-setting, Orientation, Problem-solving, Rapport Building, Art therapist, Socialization and Support  Summary of Progress/Problems: The topic for group therapy was feelings about diagnosis. Pt actively participated in group discussion on their past and current diagnosis and how they feel towards this. Pt also identified how society and family members judge them, based on their diagnosis as well as stereotypes and stigmas. Pt shared her diagnoses of schizophrenia and depression led to her seeking treatment and ultimately receiving effective treatment that negated her feelings of shame at being diagnosed.  Pt shared she has felt judged by others in the past, but that effective education on her diagnoses by professionals has helped her and her mother come to accept the pt's diagnoses as treatable. Pt shared she hopes her diagnosis will help her once she is discharged to a group home.     [name]. Zekiah Caruth, LCSWA, LCAS

## 2015-08-11 NOTE — Plan of Care (Signed)
Problem: Diagnosis: Increased Risk For Suicide Attempt Goal: STG-Patient Will Comply With Medication Regime Outcome: Progressing Patient has been compliant with medication during shift.

## 2015-08-11 NOTE — Progress Notes (Signed)
D: Patient focus today was to go to groups .  Patient given  Medication for bowel  Small amount of stool passed in 8 hours . No evidence of metal object  Passing .Patient stated slept good last night .Stated appetite is good and energy level  Is normal. Stated concentration is good . Stated on Depression scale 0 , hopeless 0 and anxiety 0 .( low 0-10 high) Denies suicidal  homicidal ideations  .  No auditory hallucinations  No pain concerns . Appropriate ADL'S. Interacting with peers and staff.  A: Encourage patient participation with unit programming . Instruction  Given on  Medication , verbalize understanding. R: Voice no other concerns. Staff continue to monitor

## 2015-08-11 NOTE — Progress Notes (Signed)
Recreation Therapy Notes  Date: 03.28.17 Time: 3:00 pm Location: Craft Room  Group Topic: Goal Setting  Goal Area(s) Addresses:  Patient will write at least one goal. Patient will write at least one obstacle.  Behavioral Response: Attentive, Interactive  Intervention: Recovery Goal Chart  Activity: Patients were instructed to make a Recovery Goal Chart including goals, obstacles, the date they started working on their goals, and the date they achieved their goals.  Education: LRT educated patients on healthy ways to celebrate reaching their goals.  Education Outcome: Acknowledges education/In group clarification offered  Clinical Observations/Feedback: Patient completed activity by writing goals and obstacles. Patient contributed to group discussion by stating how she is going to use her support system to help her reach her goals.  Leonette Monarch, LRT/CTRS 08/11/2015 4:29 PM

## 2015-08-11 NOTE — Progress Notes (Signed)
D: Patient had 1 BM on this shift which was examined. No metal objects found. Patient denies SI/HI/AVH. Patient was put on a full liquid diet which she has no complaints about. C/o mild pain in abdomen.  A: Medication given with education. Encouragement provided. Patient educated on diet.  R: Patient compliant with medication. She has remained calm and cooperative. Safety maintained with 15 min checks.

## 2015-08-11 NOTE — NC FL2 (Signed)
[place] LEVEL OF CARE SCREENING TOOL     IDENTIFICATION  Patient [name] Birthdate: 1990-11-14 Sex: female Admission Date (Current Location): 08/05/2015  [place] and [place] Number:  [name] [id number] [place] and Address:  [facility], [address] [place], [place] [address]      Provider Number: [id number]  Attending Physician Name and Address:  [name], MD  Relative Name and Phone Number:       Current Level of Care: Hospital Recommended Level of Care: Other (Comment) Prior Approval Number:    Date Approved/Denied:   PASRR Number:    Discharge Plan: Other (Comment)    Current Diagnoses: Patient Active Problem List   Diagnosis Date Noted  . Foreign body ingestion 07/31/2015  . Schizoaffective disorder, bipolar type ([place]) 11/06/2014  . Tardive dyskinesia 10/06/2014  . Tobacco use disorder 09/30/2014  . Hypothyroidism 09/29/2014  . Hypertension 09/29/2014  . Constipation 09/29/2014    Orientation RESPIRATION BLADDER Height & Weight     Self, Time, Situation, Place  Normal Continent Weight: 190 lb (86.183 kg) Height:  5\' 6"  (167.6 cm)  BEHAVIORAL SYMPTOMS/MOOD NEUROLOGICAL BOWEL NUTRITION STATUS  Dangerous to self, others or property   Continent Diet  AMBULATORY STATUS COMMUNICATION OF NEEDS Skin   Independent Verbally                         Personal Care Assistance Level of Assistance  Bathing, Feeding, Dressing, Total care Bathing Assistance: Independent Feeding assistance: Independent Dressing Assistance: Independent Total Care Assistance: Independent   Functional Limitations Info  Sight, Hearing, Speech Sight Info: Adequate Hearing Info: Adequate Speech Info: Adequate    SPECIAL CARE FACTORS FREQUENCY                       Contractures      Additional Factors Info  Psychotropic               Current Medications (08/11/2015):  This is the current  hospital active medication list acetaminophen 325 MG tablet  Commonly known as: TYLENOL  Take 2 tablets (650 mg total) by mouth every 6 (six) hours as needed for mild pain.   Indication: Fever, Pain      alum & mag hydroxide-simeth 200-200-20 MG/5ML suspension  Commonly known as: MAALOX/MYLANTA  Take 30 mLs by mouth every 4 (four) hours as needed for indigestion.   Indication: upset stomsch     amLODipine 2.5 MG tablet  Commonly known as: NORVASC  Take 1 tablet (2.5 mg total) by mouth daily.   Indication: High Blood Pressure     benztropine 1 MG tablet  Commonly known as: COGENTIN  Take 1 tablet (1 mg total) by mouth at bedtime.   Indication: Extrapyramidal Reaction caused by Medications     docusate sodium 100 MG capsule  Commonly known as: COLACE  Take 2 capsules (200 mg total) by mouth 2 (two) times daily.   Indication: Constipation     fluPHENAZine 5 MG tablet  Commonly known as: PROLIXIN  Take 1 tablet (5 mg total) by mouth 3 (three) times daily.   Indication: Schizophrenia     ibuprofen 800 MG tablet  Commonly known as: ADVIL,MOTRIN  Take 800 mg by mouth every 8 (eight) hours as needed for moderate pain.      lactulose 10 GM/15ML solution  Commonly known as: CHRONULAC  Take 45 mLs (30 g total) by mouth 2 (  two) times daily.   Indication: Chronic Constipation     levothyroxine 75 MCG tablet  Commonly known as: SYNTHROID, LEVOTHROID  Take 1 tablet (75 mcg total) by mouth daily before breakfast.   Indication: Underactive Thyroid     Lurasidone HCl 120 MG Tabs  Take 1 tablet (120 mg total) by mouth daily with supper.   Indication: Schizophrenia     OXcarbazepine 150 MG tablet  Commonly known as: TRILEPTAL  Take 1 tablet (150 mg total) by mouth 2 (two) times daily.   Indication: Manic-Depression     polyethylene glycol packet  Commonly known as: MIRALAX / GLYCOLAX   Take 17 g by mouth daily.   Indication: Constipation     senna 8.6 MG Tabs tablet  Commonly known as: SENOKOT  Take 2 tablets (17.2 mg total) by mouth at bedtime.   Indication: Emptying of the Bowel     traZODone 50 MG tablet  Commonly known as: DESYREL  Take 1 tablet (50 mg total) by mouth at bedtime.   Indication: Trouble Sleeping     venlafaxine XR 150 MG 24 hr capsule  Commonly known as: EFFEXOR-XR  Take 1 capsule (150 mg total) by mouth daily with breakfast.           Discharge Medications: Please see discharge summary for a list of discharge medications.  Relevant Imaging Results:  Relevant Lab Results:   Additional Information Allergies: Betadine, Shell fish derived products Iodine  SSN # [id number]  Megyn Leng, Carloyn Jaeger, LCSW

## 2015-08-11 NOTE — Consult Note (Signed)
Consultation  Referring Provider:     Dr Mamie Nick Admit date: 08/05/15 Consult date  08/11/15       Reason for Consultation:     Foreign body ingestion         HPI:   Melanie Bell is a 25 y.o. female with history of schizoaffective disorder/past occurences of foreign body ingestion/depresion/psychosis/mood instability who was admitted feeling anxious and sad after a friend commited suicide. Subsequently she swallowed an AA battery, screw, and another metal triangular shaped object. She has had other FB ingestions in the past and has required 2 laparotomies for removal.  CT 07/31/15 with cylinder AA battery, triangular shaped object, and a screw. She has had several abdominal films tracking the items through the intestines- Xray today also shows some wires in the distal GEJ. There does not seem to be much progression of the items EGD 7/16 by Dr Tiffany Kocher for safety pin in stomach- none found at that time States she is feeling generally well in her abdomen- says she had some lower abdominal pain yesterday that improved after she passed gas. Had a small bm today, but did not pass any of the objects. Has no further GI related complaints. Does state she is working on more positive coping mechanisms like eating carrots and celery instead.  Past Medical History  Diagnosis Date  . Hallucinations 09/30/2014    Sizoaffective  . Depression   . Anxiety   . Hypertension   . Tardive dyskinesia 10/2014    recent onset  . GERD (gastroesophageal reflux disease)   . Hyperlipidemia     Past Surgical History  Procedure Laterality Date  . Wisdom tooth extraction    . Esophagogastroduodenoscopy N/A 11/28/2014    Procedure: ESOPHAGOGASTRODUODENOSCOPY (EGD);  Surgeon: [name], MD;  [facility] ENDOSCOPY;  Service: Endoscopy;  Laterality: N/A;  . Abdominal surgery      "years ago" to remove foreign objects  . Breast lumpectomy      Family History  Problem Relation Age of Onset  . Depression Mother    . Hypertension Mother      Social History  Substance Use Topics  . Smoking status: Current Every Day Smoker -- 0.50 packs/day for 3 years    Types: Cigarettes  . Smokeless tobacco: Never Used  . Alcohol Use: No    Prior to Admission medications   Medication Sig Start Date End Date Taking? Authorizing Provider  acetaminophen (TYLENOL) 325 MG tablet Take 2 tablets (650 mg total) by mouth every 6 (six) hours as needed for mild pain. 06/19/15  Yes [name], MD  alum & mag hydroxide-simeth (MAALOX/MYLANTA) [id number] MG/5ML suspension Take 30 mLs by mouth every 4 (four) hours as needed for indigestion. 06/19/15  Yes [name] [name], MD  amLODipine (NORVASC) 2.5 MG tablet Take 1 tablet (2.5 mg total) by mouth daily. 06/19/15  Yes [name] [name], MD  benztropine (COGENTIN) 1 MG tablet Take 1 tablet (1 mg total) by mouth at bedtime. 06/19/15  Yes [name] [name], MD  docusate sodium (COLACE) 100 MG capsule Take 2 capsules (200 mg total) by mouth 2 (two) times daily. 06/19/15  Yes [name] [name], MD  fluPHENAZine (PROLIXIN) 5 MG tablet Take 1 tablet (5 mg total) by mouth 3 (three) times daily. 06/19/15  Yes [name] [name], MD  levothyroxine (SYNTHROID, LEVOTHROID) 75 MCG tablet Take 1 tablet (75 mcg total) by mouth daily before breakfast. 06/19/15  Yes [name] [name], MD  Lurasidone HCl 120  MG TABS Take 1 tablet (120 mg total) by mouth daily with supper. 06/19/15  Yes [name] [name], MD  OXcarbazepine (TRILEPTAL) 150 MG tablet Take 1 tablet (150 mg total) by mouth 2 (two) times daily. 06/19/15  Yes [name] [name], MD  traZODone (DESYREL) 50 MG tablet Take 1 tablet (50 mg total) by mouth at bedtime. 06/19/15  Yes [name], MD  venlafaxine XR (EFFEXOR-XR) 150 MG 24 hr capsule Take 1 capsule (150 mg total) by mouth daily with breakfast. 06/19/15  Yes [name] [name], MD  ibuprofen (ADVIL,MOTRIN) 800 MG tablet Take 800 mg by mouth every 8 (eight)  hours as needed for moderate pain.     Historical Provider, MD  lactulose (CHRONULAC) 10 GM/15ML solution Take 45 mLs (30 g total) by mouth 2 (two) times daily. 08/10/15   [name], MD  polyethylene glycol (MIRALAX / GLYCOLAX) packet Take 17 g by mouth daily. 08/10/15   [name], MD  senna (SENOKOT) 8.6 MG TABS tablet Take 2 tablets (17.2 mg total) by mouth at bedtime. 08/10/15   [name], MD    No current facility-administered medications for this encounter.   Current Outpatient Prescriptions  Medication Sig Dispense Refill  . lactulose (CHRONULAC) 10 GM/15ML solution Take 45 mLs (30 g total) by mouth 2 (two) times daily. 240 mL 0  . polyethylene glycol (MIRALAX / GLYCOLAX) packet Take 17 g by mouth daily. 30 each 0  . senna (SENOKOT) 8.6 MG TABS tablet Take 2 tablets (17.2 mg total) by mouth at bedtime. 60 each 0   Facility-Administered Medications Ordered in Other Encounters  Medication Dose Route Frequency Provider Last Rate Last Dose  . acetaminophen (TYLENOL) tablet 650 mg  650 mg Oral Q6H PRN [name], MD   650 mg at 08/09/15 2143  . alum & mag hydroxide-simeth (MAALOX/MYLANTA) 200-200-20 MG/5ML suspension 30 mL  30 mL Oral Q4H PRN [name], MD      . amLODipine (NORVASC) tablet 2.5 mg  2.5 mg Oral Daily [name], MD   2.5 mg at 08/11/15 [id number]  . benztropine (COGENTIN) tablet 1 mg  1 mg Oral QHS [name], MD   1 mg at 08/10/15 2128  . docusate sodium (COLACE) capsule 200 mg  200 mg Oral BID [name], MD   200 mg at 08/11/15 0940  . fluPHENAZine (PROLIXIN) tablet 5 mg  5 mg Oral 3 times per day [name], MD   5 mg at 08/11/15 1514  . lactulose (CHRONULAC) 10 GM/15ML solution 30 g  30 g Oral BID [name], MD   30 g at 08/11/15 0940  . levothyroxine (SYNTHROID, LEVOTHROID) tablet 75 mcg  75 mcg Oral QAC breakfast [name], MD   75 mcg at 08/11/15 0641  . lurasidone (LATUDA) tablet 120 mg  120 mg Oral Q  breakfast [name], MD   120 mg at 08/11/15 0816  . magnesium hydroxide (MILK OF MAGNESIA) suspension 30 mL  30 mL Oral Daily PRN [name], MD   30 mL at 08/09/15 2143  . nicotine (NICODERM CQ - dosed in mg/24 hours) patch 21 mg  21 mg Transdermal Daily [name] [name], MD   21 mg at 08/11/15 1515  . Oxcarbazepine (TRILEPTAL) tablet 150 mg  150 mg Oral BID [name], MD   150 mg at 08/11/15 0939  . polyethylene glycol (MIRALAX / GLYCOLAX) packet 17 g  17 g Oral Daily [name], MD   17 g at 08/11/15 1041  . senna (SENOKOT) tablet 17.2 mg  2 tablet Oral QHS [name], MD   17.2 mg at 08/10/15 2128  . traZODone (DESYREL) tablet 100 mg  100 mg Oral QHS [name], MD   100 mg at 08/10/15 2128  . venlafaxine XR (EFFEXOR-XR) 24 hr capsule 150 mg  150 mg Oral Q breakfast [name], MD   150 mg at 08/11/15 0817    Allergies as of 07/30/2015 - Review Complete 07/30/2015  Allergen Reaction Noted  . Betadine [povidone iodine] Other (See Comments) 09/29/2014  . Shellfish-derived products Other (See Comments) 11/05/2014  . Iodine Rash 09/29/2014     Review of Systems:    All systems reviewed and negative except where noted in HPI.     Physical Exam:  Vital signs in last 24 hours: Temp:  [98.7 F (37.1 C)] 98.7 F (37.1 C) (03/28 0705) Pulse Rate:  [74] 74 (03/28 0705) Resp:  [20] 20 (03/28 0705) BP: (110)/(64) 110/64 mmHg (03/28 0705) Last BM Date: 08/04/15 General:   Pleasant woman in NAD Head:  Normocephalic and atraumatic. Eyes:   No icterus.   Conjunctiva pink. Ears:  Normal auditory acuity. Mouth: Mucosa pink moist, no lesions. Neck:  Supple; no masses felt Lungs: Respirations even and unlabored. Lungs clear to auscultation bilaterally.   No wheezes, crackles, or rhonchi.  Heart:  S1S2, RRR, no MRG. No edema. Abdomen:   Flat, soft, nondistended, nontender. 43midline well healed scars. Normal bowel sounds. No appreciable masses or hepatomegaly.  No rebound signs or other peritoneal signs. Msk:  MAEW x4, No clubbing or cyanosis. Strength 5/5. Symmetrical without gross deformities. Neurologic:  Alert and  oriented x4;  Cranial nerves II-XII intact.  Skin:  Warm, dry, pink without significant lesions or rashes. Psych:  Alert and cooperative. Normal affect.  LAB RESULTS: No results for input(s): WBC, HGB, HCT, PLT in the last 72 hours. BMET No results for input(s): NA, K, CL, CO2, GLUCOSE, BUN, CREATININE, CALCIUM in the last 72 hours. LFT No results for input(s): PROT, ALBUMIN, AST, ALT, ALKPHOS, BILITOT, BILIDIR, IBILI in the last 72 hours. PT/INR No results for input(s): LABPROT, INR in the last 72 hours.  STUDIES: Dg Abd 2 Views  08/11/2015  CLINICAL DATA:  Foreign body alimentary tract. EXAM: ABDOMEN - 2 VIEW COMPARISON:  08/09/2015 FINDINGS: There is moderate fecal retention throughout the colon without evidence of obstruction. No dilated small bowel loops. No evidence of free peritoneal air. Metallic objects compatible with ingested battery and earring are again present over the right colon. Screw is projected over the mid abdomen just left of midline likely in small bowel. Small metallic wires projected in the region of the gastroesophageal junction slightly lower than previously seen. Remainder of the exam is unchanged. IMPRESSION: Ingested metallic objects as described. Moderate fecal retention throughout the colon. No evidence of obstruction or free peritoneal air. Electronically Signed   By: Marin Olp M.D.   On: 08/11/2015 14:41       Impression / Plan:   1. Ingested foreign bodies- currently no abdominal pain- on agents to help pass stool. Xray in am. This is not her first occasion in swallowing nonedible items, would continue to work to find other positive ways of managing stress/discontent.  Up To Date recommends following for a week and considering surgical/endoscopic removal if battery in for a week or more. Will  discuss  further with Dr [name]   Thank you very much for this consult. These services were provided by Stephens November, NP-C, in collaboration with [name], MD, with whom I have discussed this patient in full.   Stephens November, NP-C

## 2015-08-11 NOTE — Plan of Care (Signed)
Problem: Alteration in thought process Goal: LTG-Patient has not harmed self or others in at least 2 days Outcome: Progressing Appropriate Behavior , compliant with unit programing

## 2015-08-11 NOTE — [facility] Group Notes (Signed)
[place] Group Notes:  (Nursing/MHT/Case Management/Adjunct)  Date:  08/11/2015  Time:  1:54 PM  Type of Therapy:  Psychoeducational Skills  Participation Level:  Did Not Attend  Charise Killian 08/11/2015, 1:54 PM

## 2015-08-11 NOTE — Progress Notes (Signed)
D: Patient has reported 5 BMs today. No foreign objects found in BMs. Patient denies SI/HI/AVH. Patient also denies pain. She has been seen in the milieu interacting with patients.  A: Medication given with education. Encouragement provided.  R: Patient was complaint with medication. She has remained calm and cooperative. Safety maintained with 15 min checks.

## 2015-08-11 NOTE — Plan of Care (Signed)
Problem: Alteration in thought process Goal: STG-Patient is able to follow short directions Outcome: Progressing When asked to leave BM for nurses to look at patient is able to do so.

## 2015-08-11 NOTE — Consult Note (Signed)
Have discussed with Dr [name], will assess lateral abdominal film and obtain surgical consult as foreign bodies have been present since 07/30/15.

## 2015-08-12 NOTE — Progress Notes (Signed)
Visible in the milieu, back and forth from her room, allowed to vent, was encouraged to focus on her strength: "I have faith in [place] and I read my Bible, I have supportive mother, but I have been leaving at the [place] since 12 because my mother could not keep me safe at home.." Patient talked about 3 other older siblings. Will let me know when she has BM.

## 2015-08-12 NOTE — Progress Notes (Signed)
Patient had a BM, no foreign objects found in the stool.

## 2015-08-12 NOTE — Progress Notes (Signed)
[facility] MD Progress Note  08/12/2015 1:34 PM Melanie Bell  MRN:  [id number]  Subjective:  Melanie Bell denies any symptoms of depression or psychosis. She is not suicidal or homicidal. She is somewhat anxious as she has not been able to pass reporting objects she swallowed and the possibility of surgery is looming. She had 2 prior abdominal surgeries. Input from gastroenterology and surgery service is greatly appreciated. The patient is on clear liquid diet now. She no longer meets criteria for psychiatry admission could be transferred to another service at any time. At times, she has only slight discomfort on the left side of her abdomen. She is on accepted it and has been having bowel movements.  Principal Problem: Schizoaffective disorder, bipolar type ([place]) Diagnosis:   Patient Active Problem List   Diagnosis Date Noted  . Foreign body ingestion [T18.9XXA] 07/31/2015  . Schizoaffective disorder, bipolar type ([place]) [F25.0] 11/06/2014  . Tardive dyskinesia [G24.01] 10/06/2014  . Tobacco use disorder [F17.200] 09/30/2014  . Hypothyroidism [E03.9] 09/29/2014  . Hypertension [I10] 09/29/2014  . Constipation [K59.00] 09/29/2014   Total Time spent with patient: 20 minutes  Past Psychiatric History: Schizoaffective disorder.  Past Medical History:  Past Medical History  Diagnosis Date  . Hallucinations 09/30/2014    Sizoaffective  . Depression   . Anxiety   . Hypertension   . Tardive dyskinesia 10/2014    recent onset  . GERD (gastroesophageal reflux disease)   . Hyperlipidemia     Past Surgical History  Procedure Laterality Date  . Wisdom tooth extraction    . Esophagogastroduodenoscopy N/A 11/28/2014    Procedure: ESOPHAGOGASTRODUODENOSCOPY (EGD);  Surgeon: [name], MD;  Location: [facility] ENDOSCOPY;  Service: Endoscopy;  Laterality: N/A;  . Abdominal surgery      "years ago" to remove foreign objects  . Breast lumpectomy     Family History:  Family History  Problem  Relation Age of Onset  . Depression Mother   . Hypertension Mother    Family Psychiatric  History: Mother with depression. Social History:  History  Alcohol Use No     History  Drug Use No    Social History   Social History  . Marital Status: Single    Spouse Name: N/A  . Number of Children: N/A  . Years of Education: N/A   Social History Main Topics  . Smoking status: Current Every Day Smoker -- 0.50 packs/day for 3 years    Types: Cigarettes  . Smokeless tobacco: Never Used  . Alcohol Use: No  . Drug Use: No  . Sexual Activity: No   Other Topics Concern  . None   Social History Narrative   Additional Social History:    Pain Medications: See PTA Prescriptions: See PTA Over the Counter: See PTA History of alcohol / drug use?: No history of alcohol / drug abuse Longest period of sobriety (when/how long): No history of use                    Sleep: Fair  Appetite:  Fair  Current Medications: Current Facility-Administered Medications  Medication Dose Route Frequency Provider Last Rate Last Dose  . acetaminophen (TYLENOL) tablet 650 mg  650 mg Oral Q6H PRN [name], MD   650 mg at 08/09/15 2143  . alum & mag hydroxide-simeth (MAALOX/MYLANTA) 200-200-20 MG/5ML suspension 30 mL  30 mL Oral Q4H PRN [name], MD      . amLODipine (NORVASC) tablet 2.5 mg  2.5 mg Oral Daily [name], MD   2.5 mg at 08/12/15 0846  . benztropine (COGENTIN) tablet 1 mg  1 mg Oral QHS [name], MD   1 mg at 08/11/15 2121  . docusate sodium (COLACE) capsule 200 mg  200 mg Oral BID [name], MD   200 mg at 08/12/15 0846  . fluPHENAZine (PROLIXIN) tablet 5 mg  5 mg Oral 3 times per day [name], MD   5 mg at 08/12/15 0626  . lactulose (CHRONULAC) 10 GM/15ML solution 30 g  30 g Oral BID [name], MD   30 g at 08/12/15 0843  . levothyroxine (SYNTHROID, LEVOTHROID) tablet 75 mcg  75 mcg Oral QAC breakfast [name], MD   75 mcg at 08/12/15  [phone]  . lurasidone (LATUDA) tablet 120 mg  120 mg Oral Q breakfast [name], MD   120 mg at 08/12/15 0844  . magnesium hydroxide (MILK OF MAGNESIA) suspension 30 mL  30 mL Oral Daily PRN [name], MD   30 mL at 08/09/15 2143  . nicotine (NICODERM CQ - dosed in mg/24 hours) patch 21 mg  21 mg Transdermal Daily [name], MD   21 mg at 08/12/15 0852  . Oxcarbazepine (TRILEPTAL) tablet 150 mg  150 mg Oral BID [name], MD   150 mg at 08/12/15 0845  . polyethylene glycol (MIRALAX / GLYCOLAX) packet 17 g  17 g Oral Daily [name], MD   17 g at 08/12/15 0843  . senna (SENOKOT) tablet 17.2 mg  2 tablet Oral QHS [name], MD   17.2 mg at 08/11/15 2121  . traZODone (DESYREL) tablet 100 mg  100 mg Oral QHS [name], MD   100 mg at 08/11/15 2122  . venlafaxine XR (EFFEXOR-XR) 24 hr capsule 150 mg  150 mg Oral Q breakfast [name], MD   150 mg at 08/12/15 [id number]    Lab Results: No results found for this or any previous visit (from the past 48 hour(s)).  Blood Alcohol level:  Lab Results  Component Value Date   ETH <5 07/30/2015   ETH <5 07/13/2015    Physical Findings: AIMS: Facial and Oral Movements Muscles of Facial Expression: None, normal Lips and Perioral Area: None, normal Jaw: None, normal Tongue: None, normal,Extremity Movements Upper (arms, wrists, hands, fingers): None, normal Lower (legs, knees, ankles, toes): None, normal, Trunk Movements Neck, shoulders, hips: None, normal, Overall Severity Severity of abnormal movements (highest score from questions above): None, normal Incapacitation due to abnormal movements: None, normal Patient's awareness of abnormal movements (rate only patient's report): Aware, no distress, Dental Status Current problems with teeth and/or dentures?: No Does patient usually wear dentures?: No  CIWA:    COWS:     Musculoskeletal: Strength & Muscle Tone: within normal limits Gait & Station:  normal Patient leans: N/A  Psychiatric Specialty Exam: Review of Systems  Gastrointestinal: Positive for abdominal pain.  All other systems reviewed and are negative.   Blood pressure 124/85, pulse 71, temperature 98.6 F (37 C), temperature source Oral, resp. rate 20, height 5\' 6"  (1.676 m), weight 86.183 kg (190 lb), SpO2 98 %.Body mass index is 30.68 kg/(m^2).  General Appearance: Casual  Eye Contact::  Good  Speech:  Clear and Coherent  Volume:  Normal  Mood:  Anxious  Affect:  Appropriate  Thought Process:  Goal Directed  Orientation:  Full (Time, Place,  and Person)  Thought Content:  WDL  Suicidal Thoughts:  No  Homicidal Thoughts:  No  Memory:  Immediate;   Fair Recent;   Fair Remote;   Fair  Judgement:  Poor  Insight:  Shallow  Psychomotor Activity:  Normal  Concentration:  Fair  Recall:  [place]: Fair  Akathisia:  No  Handed:  Right  AIMS (if indicated):     Assets:  Communication Skills Desire for Improvement Financial Resources/Insurance Housing Resilience Social Support  ADL's:  Intact  Cognition: WNL  Sleep:  Number of Hours: 7.75   Treatment Plan Summary: Daily contact with patient to assess and evaluate symptoms and progress in treatment and Medication management   Melanie Bell is a 25 year old female with a history of schizoaffective disorder admitted for worsening of depression and suicide attempt by swallowing an AA battery, an earing and a screw.   1. Suicidal ideation. This has resolved. The patient is able to contract for safety. She is forward thinking and optimistic about the future.    2. Mood and psychosis. We continued Prolixin and Latuda for psychosis, Trileptal for mood stabilization, and Effexor for depression.   3. Hypertension. She is on amlodipine.  4. Constipation. She is on bowel regimen for constipation. No narcotics were given for pain.  5. Hypothyroidism. She is on Synthroid.  6. Smoking.  Nicotine patch was available.  7. Insomnia. She is on trazodone.  8. Social. She is an incompetent adult. Her Kara Dies is the guardian. She recently moved to [place].  9. Metabolic syndrome. Lipid panel, hemoglobin A1c, TSH, and PRL were checked during previous admission and were normal. Prolactin 15.   10. Foreign body ingestion. There are three foreign objects present in the colon on X-ray with questionable anterograde progression showing on follow up study. She injexted a battery on 3/16. She will continue bowel regimen and Lactuose. Input from GI and surgery is greatly appreciated.  11. Disposition. To be established.    Orson Slick, MD 08/12/2015, 1:34 PM

## 2015-08-12 NOTE — BHH Group Notes (Signed)
[place] Group Notes:  (Nursing/MHT/Case Management/Adjunct)  Date:  08/12/2015  Time:  1:55 PM  Type of Therapy:  Group Therapy  Participation Level:  Active  Participation Quality:  Supportive  Affect:  Appropriate  Cognitive:  Appropriate  Insight:  Good  Engagement in Group:  Supportive  Modes of Intervention:  Activity  Summary of Progress/Problems:  Melanie Bell 08/12/2015, 1:55 PM

## 2015-08-12 NOTE — BHH Group Notes (Signed)
[place] LCSW Group Therapy   08/12/2015 1pm  Type of Therapy: Group Therapy   Participation Level: Did Not Attend. Patient invited to participate but declined.    [name]. Jailee Jaquez, MSW, LCSWA, LCAS

## 2015-08-12 NOTE — BHH Group Notes (Signed)
[place] Group Notes:  (Nursing/MHT/Case Management/Adjunct)  Date:  08/12/2015  Time:  11:22 PM  Type of Therapy:  Evening Wrap-up Group  Participation Level:  Minimal  Participation Quality:  Appropriate  Affect:  Appropriate  Cognitive:  Appropriate  Insight:  Improving  Engagement in Group:  Developing/Improving  Modes of Intervention:  Discussion  Summary of Progress/Problems: Pt. Stated that she was doing ok and feeling a little better.  Carola Viramontes Nanta Ciel Yanes 08/12/2015, 11:22 PM

## 2015-08-12 NOTE — Progress Notes (Signed)
Recreation Therapy Notes  Date: 03.29.17 Time: 3:00 pm Location: Craft Room  Group Topic: Self-esteem  Goal Area(s) Addresses:  Patient will be able to identify benefit of self-esteem. Patient will be able to identify ways to increase self-esteem.  Behavioral Response: Arrived late, Attentive  Intervention: Self-Portrait  Activity: Patients were instructed to draw their self-portrait of how they felt. Afterwards, patients were instructed to write their name and positive trait about themselves. They passed their papers around and wrote positive traits about their peers. Patient read what their peers wrote and drew their self-portrait of how they felt after reading the positive traits.  Education: LRT educated patients on ways they can increase their self-esteem.  Education Outcome: In group clarification offered   Clinical Observations/Feedback: Patient arrived to group at approximately 3:24 pm. Patient colored her picture. Patient did not contribute to group discussion.  Leonette Monarch, LRT/CTRS 08/12/2015 4:00 PM

## 2015-08-12 NOTE — BHH Group Notes (Signed)
[facility] LCSW Aftercare Discharge Planning Group Note  08/12/2015 9:30 AM  Participation Quality: Did Not Attend. Patient invited to participate but declined.   [name]. Lakena Sparlin, MSW, LCSWA, LCAS

## 2015-08-12 NOTE — Tx Team (Signed)
Interdisciplinary Treatment Plan Update (Adult)  Date:  08/12/2015 Time Reviewed:  2:48 PM  Progress in Treatment: Attending groups: Yes. Participating in groups: Yes. Taking medication as prescribed: Yes. Tolerating medication: Yes. Family/Significant othe contact made: No, will contact: guardian  Patient understands diagnosis: Yes. Discussing patient identified problems/goals with staff: Yes. Medical problems stabilized or resolved:NO, foreign objects still in Pt's stomach. Denies suicidal/homicidal ideation: Yes. Issues/concerns per patient self-inventory: Yes. Other:  New problem(s) identified: No, Describe: NA  Discharge Plan or Barriers: Pt plans to return home and follow up with outpatient.   Reason for Continuation of Hospitalization: Medical Issues   Comments:. Mrs. [name] denies any symptoms of depression or psychosis. She is not suicidal or homicidal. She is somewhat anxious as she has not been able to pass reporting objects she swallowed and the possibility of surgery is looming. She had 2 prior abdominal surgeries. Input from gastroenterology and surgery service is greatly appreciated. The patient is on clear liquid diet now. She no longer meets criteria for psychiatry admission could be transferred to another service at any time. At times, she has only slight discomfort on the left side of her abdomen. She is on accepted it and has been having bowel movements   Estimated length of stay: 3-5 days   New goal(s): NA  Review of initial/current patient goals per problem list:  1. Goal(s): Patient will participate in aftercare plan  Met:Yes  Target date: at discharge  As evidenced by: Patient will participate within aftercare plan AEB aftercare provider and housing plan at discharge being identified.  2. Goal (s): Patient will exhibit decreased depressive symptoms and suicidal ideations.  Met:Yes  Target date: at discharge  As evidenced by:  Patient will utilize self rating of depression at 3 or below and demonstrate decreased signs of depression or be deemed stable for discharge by MD.   Attendees: Patient:   3/29/20172:48 PM  Family:   3/29/20172:48 PM  Physician:  Orson Slick 3/29/20172:48 PM  Nursing:   Polly Cobia, RN 3/29/20172:48 PM  Case Manager:   3/29/20172:48 PM  Counselor:  Dossie Arbour, LCSW 3/29/20172:48 PM  Other:  Everitt Amber, [place] 3/29/20172:48 PM  Other:   3/29/20172:48 PM  Other:   3/29/20172:48 PM  Other:  3/29/20172:48 PM  Other:  3/29/20172:48 PM  Other:  3/29/20172:48 PM  Other:  3/29/20172:48 PM  Other:  3/29/20172:48 PM  Other:  3/29/20172:48 PM  Other:   3/29/20172:48 PM   Scribe for Treatment Team:   [name], 08/12/2015, 2:48 PM, MSW, LCSW

## 2015-08-12 NOTE — Plan of Care (Signed)
Problem: Diagnosis: Increased Risk For Suicide Attempt Goal: LTG-Patient Family Informed of Increased Suicide Risk LTG (by discharge) Patient's family or significant other informed of patient's increased risk for suicide and they will be able to verbalize ways they can help the patient stay safe after discharge.  Outcome: Progressing Denies suicidal intent.

## 2015-08-12 NOTE — Progress Notes (Signed)
D; Patient seen by Dr. Burt Knack ( surgeon )  this am shift . Appropriate ADL and personal chores. Limited interaction with her peers  .Patient stated slept good last night .Stated appetite is good and energy level  Is normal. Stated concentration is good . Stated on Depression scale 3 , hopeless 0 and anxiety 9 Questioned patient what was her  Anxiety around, stated  .( low 0-10 high) Denies suicidal  homicidal ideations  .  No auditory hallucinations  No pain concerns . Appropriate ADL'S. Interacting with peers and staff.  A: Encourage patient participation with unit programming . Instruction  Given on  Medication , verbalize understanding. R: Voice no other concerns. Staff continue to monitor

## 2015-08-13 ENCOUNTER — Inpatient Hospital Stay: Payer: MEDICAID

## 2015-08-13 IMAGING — CR DG ABDOMEN 2V
1 series · 4 of 4 positions shown · non-contrast
Comparison: Abdomen plain films dated [DATE] and [DATE].

CLINICAL DATA: Foreign body ingestion. History of schizophrenia and
bipolar disorder.

EXAM:
ABDOMEN - 2 VIEW

[Series 1: dg abd 2 views · 0.14mm/px · 4 of 4 slices shown]
[im 1/4]
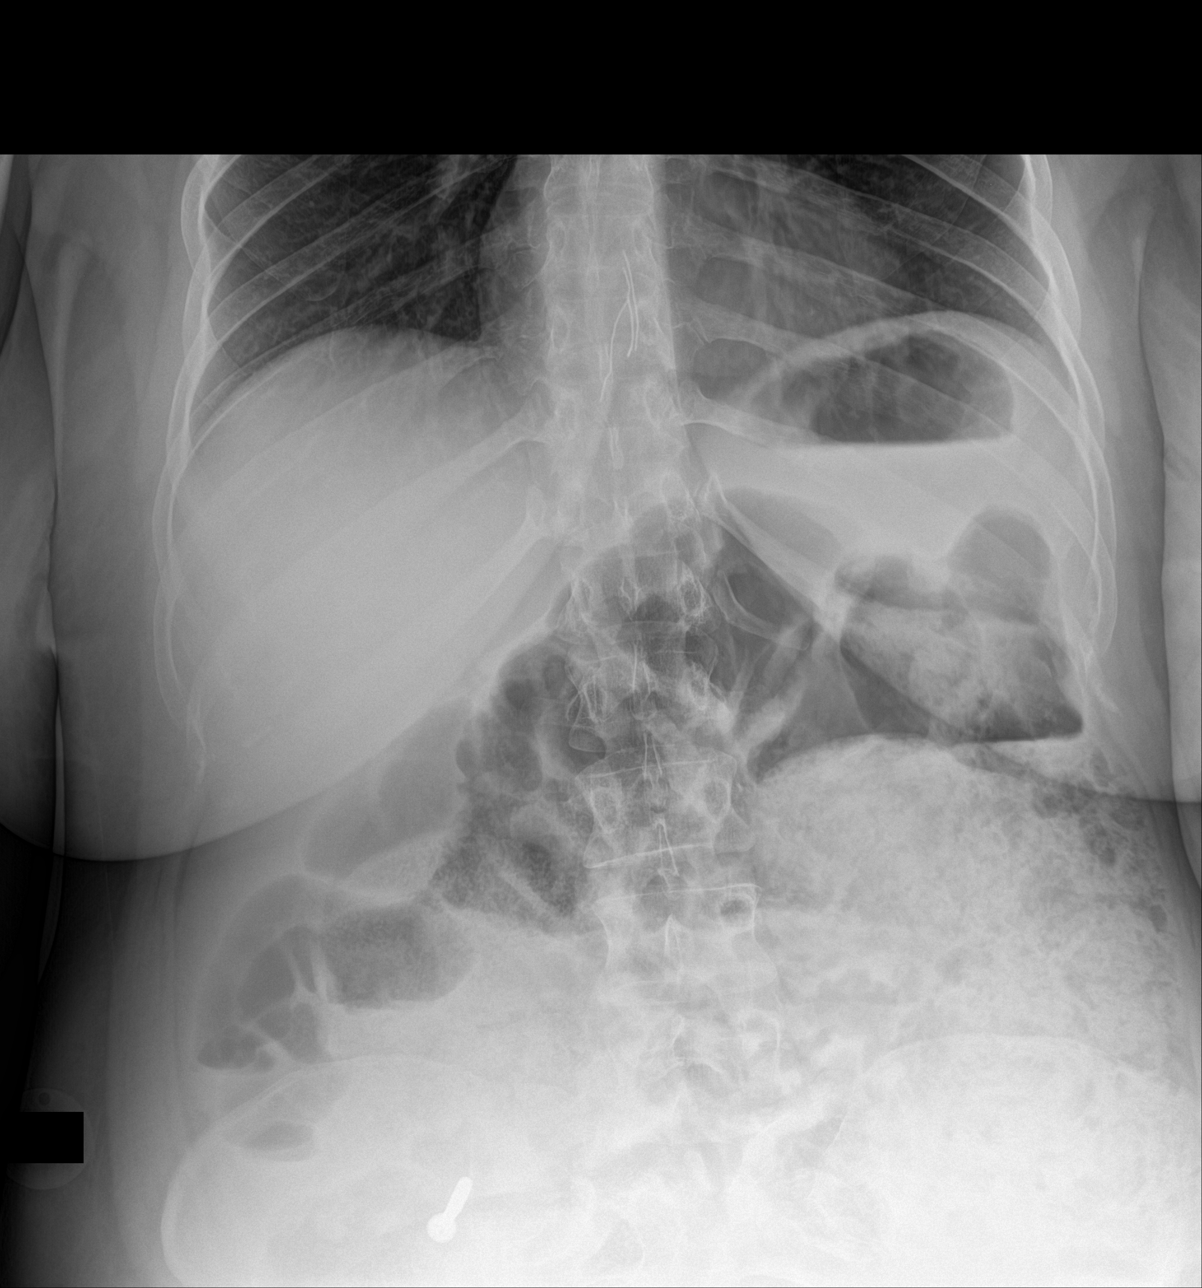
[im 2/4]
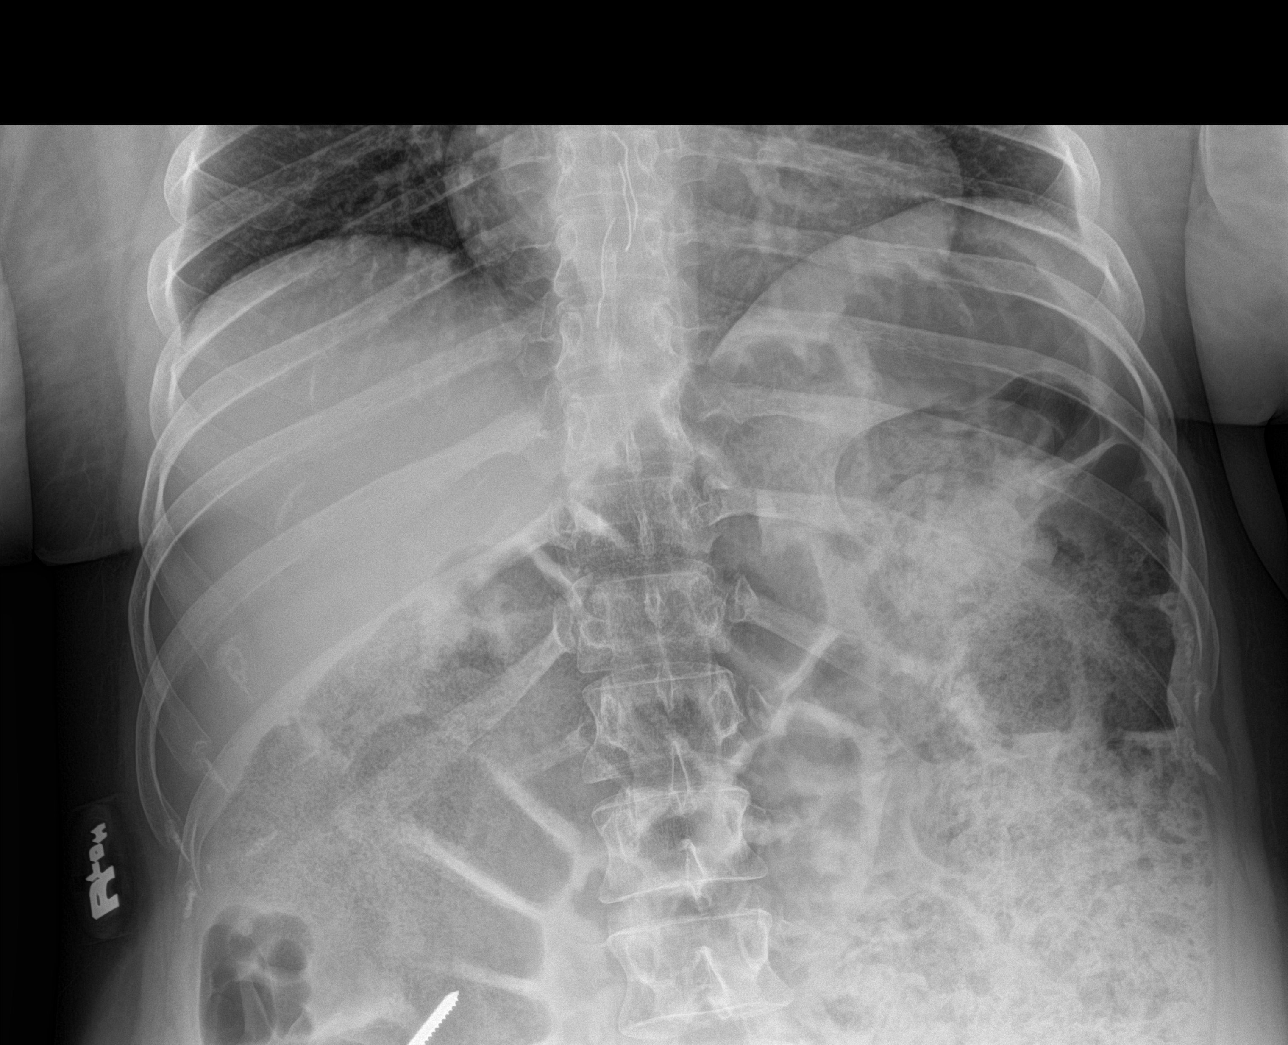
[im 3/4]
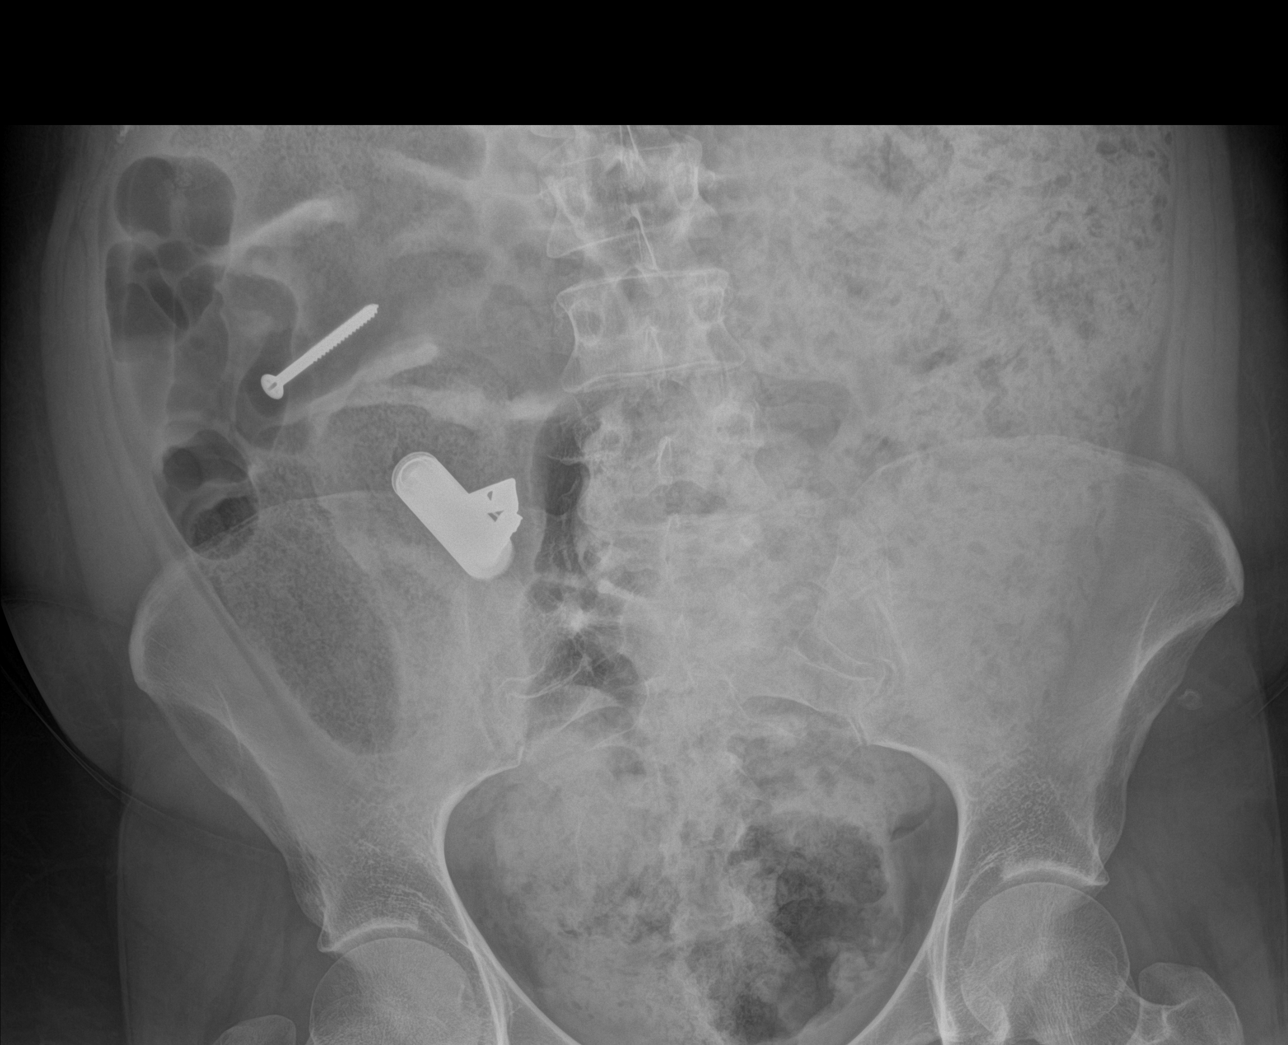
[im 4/4]
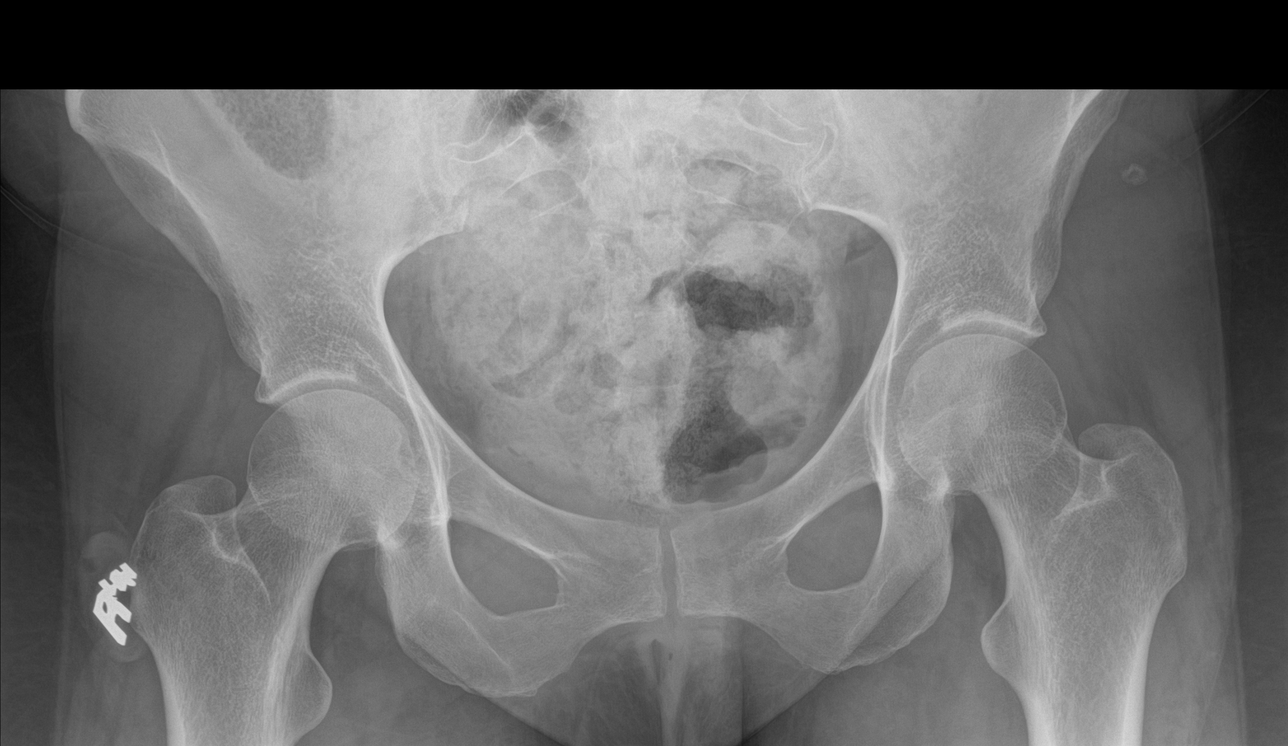

[4 of 4 positions shown; findings below may reference images not displayed]

FINDINGS: The metallic foreign bodies compatible with previous description of
battery and earring are again seen in the right lower quadrant, now
superimposed on each other, most likely within the cecum or at the
terminal ileum.

The ingested screw has moved to the right abdomen, also right lower
quadrant, slightly above the level of the aforementioned battery and
a ring.

The 3 thin metallic wires project over the lower mediastinum, most
likely within the lower esophagus.

Again noted is a large amount of stool throughout the colon. No
evidence of small bowel obstruction seen. No evidence of free
intraperitoneal air. Lung bases are clear.
IMPRESSION: The ingested metallic structures which are suggestive of a battery
and earring are now superimposed in the right lower quadrant, likely
within the cecum or terminal ileum, possibly at the ileocecal valve.
The ingested screw has moved to the right lower quadrant, also
likely within distal small bowel. The thin wirelike foreign bodies
projecting over the lower mediastinum are likely within the distal
esophagus.

Large amount of stool is again seen within the colon suggesting
constipation.

## 2015-08-13 NOTE — Plan of Care (Signed)
Problem: Ineffective individual coping Goal: STG: Patient will remain free from self harm Outcome: Progressing Medications administered as ordered by the physician, medications Therapeutic Effects, SEs and Adverse effects discussed, questions encouraged; Maalox 30 ml PO PRN given for Indigestion, 15 minute checks maintained for safety, clinical and moral support provided, patient encouraged to continue to express feelings and demonstrate safe care. Patient remain free from harm, will continue to monitor.

## 2015-08-13 NOTE — Progress Notes (Signed)
Recreation Therapy Notes  Date: 03.30.17 Time: 3:00 pm Location: Craft Room  Group Topic: Leisure Education  Goal Area(s) Addresses:  Patient will identify activities for each letter of the alphabet. Patient will verbalize ability to integrate leisure into life post d/c. Patient will verbalize ability to use leisure as a Technical sales engineer.  Behavioral Response: Attentive, Interactive  Intervention: Leisure Alphabet  Activity: Patients were given a leisure alphabet and were instructed to list a healthy leisure activity for each letter of the alphabet.  Education: LRT educated group on how they can integrate leisure in their schedule.  Education Outcome: Acknowledges education/In group clarification offered   Clinical Observations/Feedback: Patient completed activity by writing down healthy leisure activities. Patient contributed to group discussion by stating healthy leisure activities, and how she can integrate leisure in her schedule.  Leonette Monarch, LRT/CTRS 08/13/2015 4:25 PM

## 2015-08-13 NOTE — Progress Notes (Signed)
D: Patient working on Coping Skills  Patient stated slept good last night .Stated appetite is good and energy level  Is normal. Stated concentration is good . Stated on Depression scale 2 , hopeless 0 and anxiety 4 .( low 0-10 high) Denies suicidal  homicidal ideations  .  No auditory hallucinations  No pain concerns . Appropriate ADL'S. Interacting with peers and staff.  A: Encourage patient participation with unit programming . Instruction  Given on  Medication , verbalize understanding. R: Voice no other concerns. Staff continue to monitor

## 2015-08-13 NOTE — BHH Group Notes (Signed)
[place] LCSW Group Therapy   08/13/2015 11am  Type of Therapy: Group Therapy   Participation Level: Did Not Attend. Patient invited to participate but declined.    [name]. [place], MSW, LCSWA, LCAS

## 2015-08-13 NOTE — BHH Group Notes (Signed)
[place] Group Notes:  (Nursing/MHT/Case Management/Adjunct)  Date:  08/13/2015  Time:  2:45 PM  Type of Therapy:  Psychoeducational Skills  Participation Level:  Did Not Attend  Charise Killian 08/13/2015, 2:45 PM

## 2015-08-13 NOTE — Progress Notes (Addendum)
[facility] MD Progress Note  08/13/2015 1:26 PM Melanie Bell  MRN:  [id number] Subjective:    Melanie Bell has no complaints today. She denies any symptoms of depression, anxiety, or psychosis. She is not suicidal or homicidal. She denies any abdominal pain or discomfort. She has been taking medications as prescribed. She is on bowel regimen and has been having bowel movements but unfortunately no foreign objects were expelled. Spoke with Dr. [name], GI consult, to learn that the surgeons would not intervene unless there is a perforation. New abdomen x-ray was ordered today. The patient is still on liquid diet. This will be reassessed depending on the results of x-ray.   Principal Problem: Schizoaffective disorder, bipolar type ([place]) Diagnosis:   Patient Active Problem List   Diagnosis Date Noted  . Foreign body ingestion [T18.9XXA] 07/31/2015  . Schizoaffective disorder, bipolar type ([place]) [F25.0] 11/06/2014  . Tardive dyskinesia [G24.01] 10/06/2014  . Tobacco use disorder [F17.200] 09/30/2014  . Hypothyroidism [E03.9] 09/29/2014  . Hypertension [I10] 09/29/2014  . Constipation [K59.00] 09/29/2014   Total Time spent with patient: 20 minutes  Past Psychiatric History: Schizoaffective disorder.  Past Medical History:  Past Medical History  Diagnosis Date  . Hallucinations 09/30/2014    Sizoaffective  . Depression   . Anxiety   . Hypertension   . Tardive dyskinesia 10/2014    recent onset  . GERD (gastroesophageal reflux disease)   . Hyperlipidemia     Past Surgical History  Procedure Laterality Date  . Wisdom tooth extraction    . Esophagogastroduodenoscopy N/A 11/28/2014    Procedure: ESOPHAGOGASTRODUODENOSCOPY (EGD);  Surgeon: [name], MD;  Location: [facility] ENDOSCOPY;  Service: Endoscopy;  Laterality: N/A;  . Abdominal surgery      "years ago" to remove foreign objects  . Breast lumpectomy     Family History:  Family History  Problem Relation Age of Onset  .  Depression Mother   . Hypertension Mother    Family Psychiatric  History: Mother with depression.  Social History:  History  Alcohol Use No     History  Drug Use No    Social History   Social History  . Marital Status: Single    Spouse Name: N/A  . Number of Children: N/A  . Years of Education: N/A   Social History Main Topics  . Smoking status: Current Every Day Smoker -- 0.50 packs/day for 3 years    Types: Cigarettes  . Smokeless tobacco: Never Used  . Alcohol Use: No  . Drug Use: No  . Sexual Activity: No   Other Topics Concern  . None   Social History Narrative   Additional Social History:    Pain Medications: See PTA Prescriptions: See PTA Over the Counter: See PTA History of alcohol / drug use?: No history of alcohol / drug abuse Longest period of sobriety (when/how long): No history of use                    Sleep: Fair  Appetite:  Fair  Current Medications: Current Facility-Administered Medications  Medication Dose Route Frequency Provider Last Rate Last Dose  . acetaminophen (TYLENOL) tablet 650 mg  650 mg Oral Q6H PRN [name], MD   650 mg at 08/09/15 2143  . alum & mag hydroxide-simeth (MAALOX/MYLANTA) 200-200-20 MG/5ML suspension 30 mL  30 mL Oral Q4H PRN [name], MD      . amLODipine (NORVASC) tablet 2.5 mg  2.5 mg Oral Daily John  T [name], MD   2.5 mg at 08/13/15 0841  . benztropine (COGENTIN) tablet 1 mg  1 mg Oral QHS [name], MD   1 mg at 08/12/15 2144  . docusate sodium (COLACE) capsule 200 mg  200 mg Oral BID [name], MD   200 mg at 08/13/15 0843  . fluPHENAZine (PROLIXIN) tablet 5 mg  5 mg Oral 3 times per day [name], MD   5 mg at 08/13/15 [phone]  . lactulose (CHRONULAC) 10 GM/15ML solution 30 g  30 g Oral BID [name], MD   30 g at 08/13/15 [id number]  . levothyroxine (SYNTHROID, LEVOTHROID) tablet 75 mcg  75 mcg Oral QAC breakfast [name], MD   75 mcg at 08/13/15 [id number]  . lurasidone (LATUDA)  tablet 120 mg  120 mg Oral Q breakfast [name], MD   120 mg at 08/13/15 0842  . magnesium hydroxide (MILK OF MAGNESIA) suspension 30 mL  30 mL Oral Daily PRN [name], MD   30 mL at 08/09/15 2143  . nicotine (NICODERM CQ - dosed in mg/24 hours) patch 21 mg  21 mg Transdermal Daily Lorayne Getchell B Rolande Moe, MD   21 mg at 08/13/15 0846  . Oxcarbazepine (TRILEPTAL) tablet 150 mg  150 mg Oral BID [name], MD   150 mg at 08/13/15 0842  . polyethylene glycol (MIRALAX / GLYCOLAX) packet 17 g  17 g Oral Daily [name], MD   17 g at 08/13/15 0844  . senna (SENOKOT) tablet 17.2 mg  2 tablet Oral QHS [name], MD   17.2 mg at 08/12/15 2144  . traZODone (DESYREL) tablet 100 mg  100 mg Oral QHS [name], MD   100 mg at 08/12/15 2144  . venlafaxine XR (EFFEXOR-XR) 24 hr capsule 150 mg  150 mg Oral Q breakfast [name], MD   150 mg at 08/13/15 0840    Lab Results: No results found for this or any previous visit (from the past 48 hour(s)).  Blood Alcohol level:  Lab Results  Component Value Date   ETH <5 07/30/2015   ETH <5 07/13/2015    Physical Findings: AIMS: Facial and Oral Movements Muscles of Facial Expression: None, normal Lips and Perioral Area: None, normal Jaw: None, normal Tongue: None, normal,Extremity Movements Upper (arms, wrists, hands, fingers): None, normal Lower (legs, knees, ankles, toes): None, normal, Trunk Movements Neck, shoulders, hips: None, normal, Overall Severity Severity of abnormal movements (highest score from questions above): None, normal Incapacitation due to abnormal movements: None, normal Patient's awareness of abnormal movements (rate only patient's report): Aware, no distress, Dental Status Current problems with teeth and/or dentures?: No Does patient usually wear dentures?: No  CIWA:    COWS:     Musculoskeletal: Strength & Muscle Tone: within normal limits Gait & Station: normal Patient leans: N/A  Psychiatric  Specialty Exam: Review of Systems  All other systems reviewed and are negative.   Blood pressure 101/68, pulse 87, temperature 98.3 F (36.8 C), temperature source Oral, resp. rate 20, height 5\' 6"  (1.676 m), weight 86.183 kg (190 lb), SpO2 98 %.Body mass index is 30.68 kg/(m^2).  General Appearance: Casual  Eye Contact::  Good  Speech:  Clear and Coherent  Volume:  Normal  Mood:  Euthymic  Affect:  Appropriate  Thought Process:  Goal Directed  Orientation:  Full (Time, Place, and Person)  Thought Content:  WDL  Suicidal Thoughts:  No  Homicidal Thoughts:  No  Memory:  Immediate;   Fair Recent;   Fair Remote;   Fair  Judgement:  Poor  Insight:  Lacking  Psychomotor Activity:  Normal  Concentration:  Fair  Recall:  [employer] of Knowledge:Fair  Language: Fair  Akathisia:  No  Handed:  Right  AIMS (if indicated):     Assets:  Communication Skills Desire for Improvement Financial Resources/Insurance Housing Resilience Social Support  ADL's:  Intact  Cognition: WNL  Sleep:  Number of Hours: 7   Treatment Plan Summary: Daily contact with patient to assess and evaluate symptoms and progress in treatment and Medication management   Melanie Bell is a 25 year old female with a history of schizoaffective disorder admitted for worsening of depression and suicide attempt by swallowing an AA battery, an earing and a screw.   1. Suicidal ideation. This has resolved. The patient is able to contract for safety. She is forward thinking and optimistic about the future.    2. Mood and psychosis. We continued Prolixin and Latuda for psychosis, Trileptal for mood stabilization, and Effexor for depression.   3. Hypertension. She is on amlodipine.  4. Constipation. She is on bowel regimen for constipation. No narcotics were given for pain.  5. Hypothyroidism. She is on Synthroid.  6. Smoking. Nicotine patch was available.  7. Insomnia. She is on trazodone.  8. Social. She is an  incompetent adult. Her Kara Dies is the guardian. She recently moved to [place].  9. Metabolic syndrome. Lipid panel, hemoglobin A1c, TSH, and PRL were checked during previous admission and were normal. Prolactin 15.   10. Foreign body ingestion. There are three foreign objects present in the colon on X-ray with questionable anterograde progression showing on follow up study. She injested an AA battery on 3/16. She will continue bowel regimen and Lactuose. Input from GI and surgery is greatly appreciated. Spoke with Dr. [name], GI. There will be no surgical intervention unless there is perforation/peritonitis. Another X-ray is planned.  11. Disposition. To be established.    Orson Slick, MD 08/13/2015, 1:26 PM

## 2015-08-13 NOTE — Plan of Care (Signed)
Problem: Ineffective individual coping Goal: LTG: Patient will report a decrease in negative feelings Outcome: Progressing Working on coping skills for situations With no control.

## 2015-08-13 NOTE — Plan of Care (Signed)
Problem: Ineffective individual coping Goal: STG: Patient will remain free from self harm Outcome: Progressing Contracted for safety, medications administered as ordered by the physician, medications Therapeutic Effects, SEs and Adverse effects discussed, questions encouraged; no PRN given, 15 minute checks maintained for safety, clinical and moral support provided, patient encouraged to continue to express feelings and demonstrate safe care. Patient remain free from harm, will continue to monitor.

## 2015-08-14 MED ORDER — MAGNESIUM CITRATE PO SOLN
1.0000 | Freq: Once | ORAL | Status: AC
  Administered 2015-08-14: 1 via ORAL
  Filled 2015-08-14: qty 296

## 2015-08-14 MED ORDER — POLYETHYLENE GLYCOL 3350 17 G PO PACK
17.0000 g | PACK | Freq: Two times a day (BID) | ORAL | Status: DC
  Administered 2015-08-14 – 2015-09-10 (×53): 17 g via ORAL
  Filled 2015-08-14 (×54): qty 1

## 2015-08-14 NOTE — BHH Group Notes (Signed)
[facility] LCSW Aftercare Discharge Planning Group Note   08/14/2015 9:15 AM  ?  Participation Quality: Alert, Appropriate and Oriented   Mood/Affect: Appropriate  Depression Rating: 1  Anxiety Rating: 0  Thoughts of Suicide: Pt denies SI/HI   Will you contract for safety? Yes   Current AVH: Pt denies   Plan for Discharge/Comments: Pt attended discharge planning group and actively participated in group. CSW provided pt with today's workbook. Pt reports she is excited to discharge and return to her group home.  Pt reports her one goal is to discharge from [facility].   CSW actively validated the pt's opinion and provided feedback.  Pt was polite and cooperative with the CSW and other group members and focused and attentive to the topics discussed and the sharing of others.  Transportation Means: Pt reports access to transportation   Supports: Pt lists family and friends as primary supports   [name]. Aysa Larivee, MSW, LCSWA, LCAS

## 2015-08-14 NOTE — Progress Notes (Signed)
"  The doctor may discharge me back to the group home and will bring me back for emergency surgery if I don't pass the battery and other objects. I promised to not do this again ..." Sad affect, remorseful, apathetic, denied pain or discomfort, denied SI/HI, denied AV/H. Continues to interact well with others and reads the Novel. Maalox given for indigestion.

## 2015-08-14 NOTE — Progress Notes (Signed)
Recreation Therapy Notes  Date: 03.31.17 Time: 3:00 pm Location: Craft Room  Group Topic: Communication, Problem Solving, Teamwork  Goal Area(s) Addresses:  Patient will effectively work with peer towards shared goal. Patient will identify skills used to make activity successful. Patient will identify benefit of using group skills effectively post d/c.  Behavioral Response: Attentive, Interactive  Intervention: [employer]  Activity: Patients were given 15 pipe cleaners and were instructed to build a free standing tower out of all 15 pipe cleaners. After approximately 3-5 minutes, patients were instructed to put their dominant hand behind their back. After another 3-5 minutes, patients were instructed to stop talking to their group.  Education: LRT educated patients on healthy support systems.  Education Outcome: Acknowledges education/In group clarification offered  Clinical Observations/Feedback: Patient worked with peers to build tower. Patient used effective communication, problem solving, and teamwork skills. Patient contributed to group discussion by stating what skills her team used, why communication, problem solving, and teamwork are important, how she felt after using the skills in group, and what would change for her if she used these skills effectively.   Leonette Monarch, LRT/CTRS 08/14/2015 4:34 PM

## 2015-08-14 NOTE — Plan of Care (Signed)
Problem: Alteration in thought process Goal: STG-Patient does not respond to command hallucinations Outcome: Progressing Patient denies +ve AH/VH and SI. Contracts for safety.

## 2015-08-14 NOTE — Consult Note (Signed)
Subjective: Patient seen for ingested foreign bodies. She is in no distress. She denies a nausea. She has some lower abdominal discomfort but has not had a bowel movement for multiple days.  Objective: Vital signs in last 24 hours: Temp:  [98.5 F (36.9 C)] 98.5 F (36.9 C) (03/31 0700) Pulse Rate:  [83] 83 (03/31 0700) Resp:  [20] 20 (03/31 0700) BP: (108)/(71) 108/71 mmHg (03/31 0700) Blood pressure 108/71, pulse 83, temperature 98.5 F (36.9 C), temperature source Oral, resp. rate 20, height 5\' 6"  (1.676 m), weight 86.183 kg (190 lb), SpO2 98 %.   Intake/Output from previous day: 08-30-22 0701 - 03/31 0700 In: 77 [P.O.:720] Out: -   Intake/Output this shift: Total I/O In: 960 [P.O.:960] Out: -    General appearance:  Well-appearing 25 year old female no distress Resp:  Clear to auscultation Cardio:  Regular rate and rhythm without rub or gallop GI:  Soft protuberant mild discomfort palpation across the lower abdomen. There are no masses or rebound. Bowel sounds are positive and indeed hyperactive. Extremities:  No clubbing cyanosis or edema   Lab Results: No results found for this or any previous visit (from the past 24 hour(s)).   No results for input(s): WBC, HGB, HCT, PLT in the last 72 hours. BMET No results for input(s): NA, K, CL, CO2, GLUCOSE, BUN, CREATININE, CALCIUM in the last 72 hours. LFT No results for input(s): PROT, ALBUMIN, AST, ALT, ALKPHOS, BILITOT, BILIDIR, IBILI in the last 72 hours. PT/INR No results for input(s): LABPROT, INR in the last 72 hours. Hepatitis Panel No results for input(s): HEPBSAG, HCVAB, HEPAIGM, HEPBIGM in the last 72 hours. C-Diff No results for input(s): CDIFFTOX in the last 72 hours. No results for input(s): CDIFFPCR in the last 72 hours.   Studies/Results: Dg Abd 2 Views  08-30-2015  CLINICAL DATA:  Foreign body ingestion. History of schizophrenia and bipolar disorder. EXAM: ABDOMEN - 2 VIEW COMPARISON:  Abdomen plain  films dated 08/11/2015 and 08/09/2015. FINDINGS: The metallic foreign bodies compatible with previous description of battery and earring are again seen in the right lower quadrant, now superimposed on each other, most likely within the cecum or at the terminal ileum. The ingested screw has moved to the right abdomen, also right lower quadrant, slightly above the level of the aforementioned battery and a ring. The 3 thin metallic wires project over the lower mediastinum, most likely within the lower esophagus. Again noted is a large amount of stool throughout the colon. No evidence of small bowel obstruction seen. No evidence of free intraperitoneal air. Lung bases are clear. IMPRESSION: The ingested metallic structures which are suggestive of a battery and earring are now superimposed in the right lower quadrant, likely within the cecum or terminal ileum, possibly at the ileocecal valve. The ingested screw has moved to the right lower quadrant, also likely within distal small bowel. The thin wirelike foreign bodies projecting over the lower mediastinum are likely within the distal esophagus. Large amount of stool is again seen within the colon suggesting constipation. Electronically Signed   By: Franki Cabot M.D.   On: 08-30-2015 15:12    Scheduled Inpatient Medications:   . amLODipine  2.5 mg Oral Daily  . benztropine  1 mg Oral QHS  . docusate sodium  200 mg Oral BID  . fluPHENAZine  5 mg Oral 3 times per day  . lactulose  30 g Oral BID  . levothyroxine  75 mcg Oral QAC breakfast  . lurasidone  120 mg  Oral Q breakfast  . nicotine  21 mg Transdermal Daily  . Oxcarbazepine  150 mg Oral BID  . polyethylene glycol  17 g Oral Daily  . senna  2 tablet Oral QHS  . traZODone  100 mg Oral QHS  . venlafaxine XR  150 mg Oral Q breakfast    Continuous Inpatient Infusions:     PRN Inpatient Medications:  acetaminophen, alum & mag hydroxide-simeth, magnesium hydroxide  Miscellaneous:   Assessment:   1. Ingestion of multiple foreign bodies. She is stable and shows no evidence of acute abdomen. He does have a history of chronic constipation. She has been on MiraLAX daily however is given a dose of mag citrate earlier today which likely accounts for the hyperactive bowel sounds.  Plan:  1. We'll see how she does with the mag citrate. Recommend increasing the MiraLAX to twice daily as a regular dosing.  Dr. Vira Agar is on-call this weekend for coverage, please call if needed. I will be back on Monday. We'll repeat two-way abdominal film on Monday.  [name] MD 08/14/2015, 4:19 PM

## 2015-08-14 NOTE — Progress Notes (Signed)
Patient remains cooperative with unit routine. Mag Citrate X1 given to patient but she states that she has not had a BM yet at this time. States that she is only passing gas. Encouraged that when she does get a BM to call staff. She verbalized understanding. Denies SI and contracts for safety. Denies +ve AH/VH.

## 2015-08-14 NOTE — Plan of Care (Signed)
Problem: Diagnosis: Increased Risk For Suicide Attempt Goal: LTG-Patient Will Report Improved Mood and Deny Suicidal LTG (by discharge) Patient will report improved mood and deny suicidal ideation.  Outcome: Progressing Denies SI and contracts for safety.

## 2015-08-14 NOTE — Progress Notes (Signed)
[facility] MD Progress Note  08/14/2015 5:17 AM Melanie Bell  MRN:  [id number]  Subjective:  Melanie Bell is a 25 year old female with a history of schizoaffective disorder. She was briefly admitted to medical floor after she swallowed 3 foreign objects including a screw, AA battery and an earring. She was transferred to psychiatry before she passed them. The battery has been there since March 15. Apparently about that he should not stick around the gap for longer than a week according to GI note. She is now being followed by Dr. [name], GI. Surgery refuses to operate until there is perforation and/or peritonitis. The patient is no longer in need of psychiatric care but I could not discharge her to home given her medical condition. She is on liquid diet and laxatives.   Melanie Bell denies any symptoms of depression, anxiety, or psychosis. She denies any abdominal pain. In spite over multiple laxatives and lactulose she has not had a bowel movement yesterday. We will give mag citrate again today as her x-ray indicates massive amounts of stool in the bowels. She accepts medications and tolerates them well. She participates well in programming.  Principal Problem: Schizoaffective disorder, bipolar type ([place]) Diagnosis:   Patient Active Problem List   Diagnosis Date Noted  . Foreign body ingestion [T18.9XXA] 07/31/2015  . Schizoaffective disorder, bipolar type ([place]) [F25.0] 11/06/2014  . Tardive dyskinesia [G24.01] 10/06/2014  . Tobacco use disorder [F17.200] 09/30/2014  . Hypothyroidism [E03.9] 09/29/2014  . Hypertension [I10] 09/29/2014  . Constipation [K59.00] 09/29/2014   Total Time spent with patient: 20 minutes  Past Psychiatric History: schizoaffective disorder.  Past Medical History:  Past Medical History  Diagnosis Date  . Hallucinations 09/30/2014    Sizoaffective  . Depression   . Anxiety   . Hypertension   . Tardive dyskinesia 10/2014    recent onset  . GERD  (gastroesophageal reflux disease)   . Hyperlipidemia     Past Surgical History  Procedure Laterality Date  . Wisdom tooth extraction    . Esophagogastroduodenoscopy N/A 11/28/2014    Procedure: ESOPHAGOGASTRODUODENOSCOPY (EGD);  Surgeon: [name], MD;  Location: [facility] ENDOSCOPY;  Service: Endoscopy;  Laterality: N/A;  . Abdominal surgery      "years ago" to remove foreign objects  . Breast lumpectomy     Family History:  Family History  Problem Relation Age of Onset  . Depression Mother   . Hypertension Mother    Family Psychiatric  History: depression. Social History:  History  Alcohol Use No     History  Drug Use No    Social History   Social History  . Marital Status: Single    Spouse Name: N/A  . Number of Children: N/A  . Years of Education: N/A   Social History Main Topics  . Smoking status: Current Every Day Smoker -- 0.50 packs/day for 3 years    Types: Cigarettes  . Smokeless tobacco: Never Used  . Alcohol Use: No  . Drug Use: No  . Sexual Activity: No   Other Topics Concern  . None   Social History Narrative   Additional Social History:    Pain Medications: See PTA Prescriptions: See PTA Over the Counter: See PTA History of alcohol / drug use?: No history of alcohol / drug abuse Longest period of sobriety (when/how long): No history of use                    Sleep: Fair  Appetite:  Fair  Current Medications: Current Facility-Administered Medications  Medication Dose Route Frequency Provider Last Rate Last Dose  . acetaminophen (TYLENOL) tablet 650 mg  650 mg Oral Q6H PRN [name], MD   650 mg at 08/09/15 2143  . alum & mag hydroxide-simeth (MAALOX/MYLANTA) 200-200-20 MG/5ML suspension 30 mL  30 mL Oral Q4H PRN [name], MD   30 mL at 08/13/15 2107  . amLODipine (NORVASC) tablet 2.5 mg  2.5 mg Oral Daily [name], MD   2.5 mg at 08/13/15 0841  . benztropine (COGENTIN) tablet 1 mg  1 mg Oral QHS [name],  MD   1 mg at 08/13/15 2101  . docusate sodium (COLACE) capsule 200 mg  200 mg Oral BID [name], MD   200 mg at 08/13/15 2101  . fluPHENAZine (PROLIXIN) tablet 5 mg  5 mg Oral 3 times per day [name], MD   5 mg at 08/13/15 2101  . lactulose (CHRONULAC) 10 GM/15ML solution 30 g  30 g Oral BID [name], MD   30 g at 08/13/15 2106  . levothyroxine (SYNTHROID, LEVOTHROID) tablet 75 mcg  75 mcg Oral QAC breakfast [name], MD   75 mcg at 08/13/15 [phone]  . lurasidone (LATUDA) tablet 120 mg  120 mg Oral Q breakfast [name], MD   120 mg at 08/13/15 0842  . magnesium hydroxide (MILK OF MAGNESIA) suspension 30 mL  30 mL Oral Daily PRN [name], MD   30 mL at 08/09/15 2143  . nicotine (NICODERM CQ - dosed in mg/24 hours) patch 21 mg  21 mg Transdermal Daily [name] [name], MD   21 mg at 08/13/15 0846  . Oxcarbazepine (TRILEPTAL) tablet 150 mg  150 mg Oral BID [name], MD   150 mg at 08/13/15 2102  . polyethylene glycol (MIRALAX / GLYCOLAX) packet 17 g  17 g Oral Daily [name], MD   17 g at 08/13/15 0844  . senna (SENOKOT) tablet 17.2 mg  2 tablet Oral QHS [name], MD   17.2 mg at 08/13/15 2102  . traZODone (DESYREL) tablet 100 mg  100 mg Oral QHS [name], MD   100 mg at 08/13/15 2101  . venlafaxine XR (EFFEXOR-XR) 24 hr capsule 150 mg  150 mg Oral Q breakfast [name], MD   150 mg at 08/13/15 0840    Lab Results: No results found for this or any previous visit (from the past 48 hour(s)).  Blood Alcohol level:  Lab Results  Component Value Date   ETH <5 07/30/2015   ETH <5 07/13/2015    Physical Findings: AIMS: Facial and Oral Movements Muscles of Facial Expression: None, normal Lips and Perioral Area: None, normal Jaw: None, normal Tongue: None, normal,Extremity Movements Upper (arms, wrists, hands, fingers): None, normal Lower (legs, knees, ankles, toes): None, normal, Trunk Movements Neck, shoulders, hips: None,  normal, Overall Severity Severity of abnormal movements (highest score from questions above): None, normal Incapacitation due to abnormal movements: None, normal Patient's awareness of abnormal movements (rate only patient's report): Aware, no distress, Dental Status Current problems with teeth and/or dentures?: No Does patient usually wear dentures?: No  CIWA:    COWS:     Musculoskeletal: Strength & Muscle Tone: within normal limits Gait & Station: normal Patient leans: N/A  Psychiatric Specialty Exam: Review of Systems  All other systems reviewed and are negative.   Blood pressure 101/68,  pulse 87, temperature 98.3 F (36.8 C), temperature source Oral, resp. rate 20, height 5\' 6"  (1.676 m), weight 86.183 kg (190 lb), SpO2 98 %.Body mass index is 30.68 kg/(m^2).  General Appearance: Casual  Eye Contact::  Good  Speech:  Clear and Coherent  Volume:  Normal  Mood:  Euthymic  Affect:  Appropriate  Thought Process:  Goal Directed  Orientation:  Full (Time, Place, and Person)  Thought Content:  WDL  Suicidal Thoughts:  No  Homicidal Thoughts:  No  Memory:  Immediate;   Fair Recent;   Fair Remote;   Fair  Judgement:  Poor  Insight:  Lacking  Psychomotor Activity:  Normal  Concentration:  Fair  Recall:  [employer] of Knowledge:Fair  Language: Fair  Akathisia:  No  Handed:  Right  AIMS (if indicated):     Assets:  Communication Skills Desire for Improvement Financial Resources/Insurance Housing Resilience Social Support  ADL's:  Intact  Cognition: WNL  Sleep:  Number of Hours: 7   Treatment Plan Summary: Daily contact with patient to assess and evaluate symptoms and progress in treatment  Melanie Bell is a 25 year old female with a history of schizoaffective disorder admitted for worsening of depression and suicide attempt by swallowing an AA battery, an earing and a screw.   1. Suicidal ideation. This has resolved. The patient is able to contract for safety. She  is forward thinking and optimistic about the future.    2. Mood and psychosis. We continued Prolixin and Latuda for psychosis, Trileptal for mood stabilization, and Effexor for depression.   3. Hypertension. She is on amlodipine.  4. Constipation. She is on bowel regimen for constipation. No narcotics were given for pain.  5. Hypothyroidism. She is on Synthroid.  6. Smoking. Nicotine patch was available.  7. Insomnia. She is on trazodone.  8. Social. She is an incompetent adult. Her Melanie Bell is the guardian. She recently moved to [place].  9. Metabolic syndrome. Lipid panel, hemoglobin A1c, TSH, and PRL were checked during previous admission and were normal. Prolactin 15.   10. Foreign body ingestion. There are three foreign objects present in the colon on X-ray with questionable anterograde progression showing on follow up study. She injested an AA battery on 3/16. She will continue bowel regimen and Lactuose. Input from GI and surgery is greatly appreciated. Spoke with Dr. [name], GI. There will be no surgical intervention unless there is perforation/peritonitis. Another X-ray is planned. Apparently, such battery removal should be completed within 7 days of injestion, see GI note.  11. Disposition. To be established.    Orson Slick, MD 08/14/2015, 5:17 AM

## 2015-08-14 NOTE — BHH Suicide Risk Assessment (Signed)
[place] INPATIENT:  Family/Significant Other Suicide Prevention Education  Suicide Prevention Education:  Education Completed;[facility] (group home)  has been identified by the patient as the family member/significant other with whom the patient will be residing, and identified as the person(s) who will aid the patient in the event of a mental health crisis (suicidal ideations/suicide attempt).  With written consent from the patient, the family member/significant other has been provided the following suicide prevention education, prior to the and/or following the discharge of the patient.  The suicide prevention education provided includes the following:  Suicide risk factors  Suicide prevention and interventions  National Suicide Hotline telephone number  [facility] assessment telephone number  [facility] Emergency Assistance [place] and/or Residential Mobile Crisis Unit telephone number  Request made of family/significant other to:  Remove weapons (e.g., guns, rifles, knives), all items previously/currently identified as safety concern.    Remove drugs/medications (over-the-counter, prescriptions, illicit drugs), all items previously/currently identified as a safety concern.  The family member/significant other verbalizes understanding of the suicide prevention education information provided.  The family member/significant other agrees to remove the items of safety concern listed above.  [place] MSW, [place]  08/14/2015, 2:39 PM

## 2015-08-14 NOTE — Tx Team (Signed)
Interdisciplinary Treatment Plan Update (Adult)  Date:  08/14/2015 Time Reviewed:  2:33 PM  Progress in Treatment: Attending groups: Yes. Participating in groups:  Yes. Taking medication as prescribed:  Yes. Tolerating medication:  Yes. Family/Significant othe contact made:  Yes, individual(s) contacted:  mother and group home Patient understands diagnosis:  Yes. Discussing patient identified problems/goals with staff:  Yes. Medical problems stabilized or resolved:  No. and As evidenced by:  Pt has not passed the battery yet.  Denies suicidal/homicidal ideation: Yes. Issues/concerns per patient self-inventory:  Yes. Other:  New problem(s) identified: No, Describe:  NA  Discharge Plan or Barriers: Pt plans to return home and follow up with outpatient.    Reason for Continuation of Hospitalization: Hallucinations Medication stabilization Other; describe Pt swallowed objects  Comments: Melanie Bell is a 25 year old female with a history of schizoaffective disorder. She was briefly admitted to medical floor after she swallowed 3 foreign objects including a screw, AA battery and an earring. She was transferred to psychiatry before she passed them. The battery has been there since March 15. Apparently about that he should not stick around the gap for longer than a week according to GI note. She is now being followed by Dr. [name], GI. Surgery refuses to operate until there is perforation and/or peritonitis. The patient is no longer in need of psychiatric care but I could not discharge her to home given her medical condition. She is on liquid diet and laxatives.  Melanie Bell denies any symptoms of depression, anxiety, or psychosis. She denies any abdominal pain. In spite over multiple laxatives and lactulose she has not had a bowel movement yesterday. We will give mag citrate again today as her x-ray indicates massive amounts of stool in the bowels. She accepts medications and tolerates them  well. She participates well in programming.   Estimated length of stay: 4 days   New goal(s): NA  Review of initial/current patient goals per problem list:   1.  Goal(s): Patient will participate in aftercare plan * Met:  * Target date: at discharge * As evidenced by: Patient will participate within aftercare plan AEB aftercare provider and housing plan at discharge being identified.   2.  Goal (s): Patient will exhibit decreased depressive symptoms and suicidal ideations. * Met:  *  Target date: at discharge * As evidenced by: Patient will utilize self rating of depression at 3 or below and demonstrate decreased signs of depression or be deemed stable for discharge by MD.  3.  Goal (s): Patient will demonstrate decreased symptoms of psychosis. * Met: No  *  Target date: at discharge * As evidenced by: Patient will not endorse signs of psychosis or be deemed stable for discharge by MD.   Attendees: Patient:  Melanie Bell 3/31/20172:33 PM  Family:   3/31/20172:33 PM  Physician:   Dr. Bary Leriche  3/31/20172:33 PM  Nursing:   Hulan Amato, RN  3/31/20172:33 PM  Case Manager:   3/31/20172:33 PM  Counselor:   3/31/20172:33 PM  Other:  [place] 3/31/20172:33 PM  Other:  Everitt Amber, [place]  3/31/20172:33 PM  Other:   3/31/20172:33 PM  Other:  3/31/20172:33 PM  Other:  3/31/20172:33 PM  Other:  3/31/20172:33 PM  Other:  3/31/20172:33 PM  Other:  3/31/20172:33 PM  Other:  3/31/20172:33 PM  Other:   3/31/20172:33 PM   Scribe for Treatment Team:   Wray Kearns, MSW, [place]  08/14/2015, 2:33 PM

## 2015-08-14 NOTE — BHH Group Notes (Signed)
[place] LCSW Group Therapy   08/14/2015 1:15 PM   Type of Therapy: Group Therapy   Participation Level: Active   Participation Quality: Attentive, Sharing and Supportive   Affect: Depressed and Flat   Cognitive: Alert and Oriented   Insight: Developing/Improving and Engaged   Engagement in Therapy: Developing/Improving and Engaged   Modes of Intervention: Clarification, Confrontation, Discussion, Education, Exploration, Limit-setting, Orientation, Problem-solving, Rapport Building, Art therapist, Socialization and Support   Summary of Progress/Problems: The topic for today was feelings about relapse. Pt discussed what relapse prevention is to them and identified triggers that they are on the path to relapse. Pt processed their feeling towards relapse and was able to relate to peers. Pt discussed coping skills that can be used for relapse prevention. Pt shared she was depressed that she was not being discharged but that she understood the reasoning behind this.  Pt shared she was lonely but enjoyed groups and the dayroom comraderie.  Pt shared she believes things will turn out good for her and she will not relapse.  Pt reported that for the pt fear is a trigger.   CSW actively validated the pt's opinion and provided feedback.  Pt was polite and cooperative with the CSW and other group members and focused and attentive to the topics discussed and the sharing of others.   [name]. Maureen Duesing, MSW, LCSWA, LCAS

## 2015-08-15 ENCOUNTER — Inpatient Hospital Stay: Payer: MEDICAID

## 2015-08-15 IMAGING — CR DG ABDOMEN 1V
2 series · 2 of 2 positions shown · non-contrast
Comparison: 1 day prior

CLINICAL DATA: Swallowing foreign objects. Follow-up. Abdominal
pain.

EXAM:
ABDOMEN - 1 VIEW

[abdomen kub (1 of 2)]
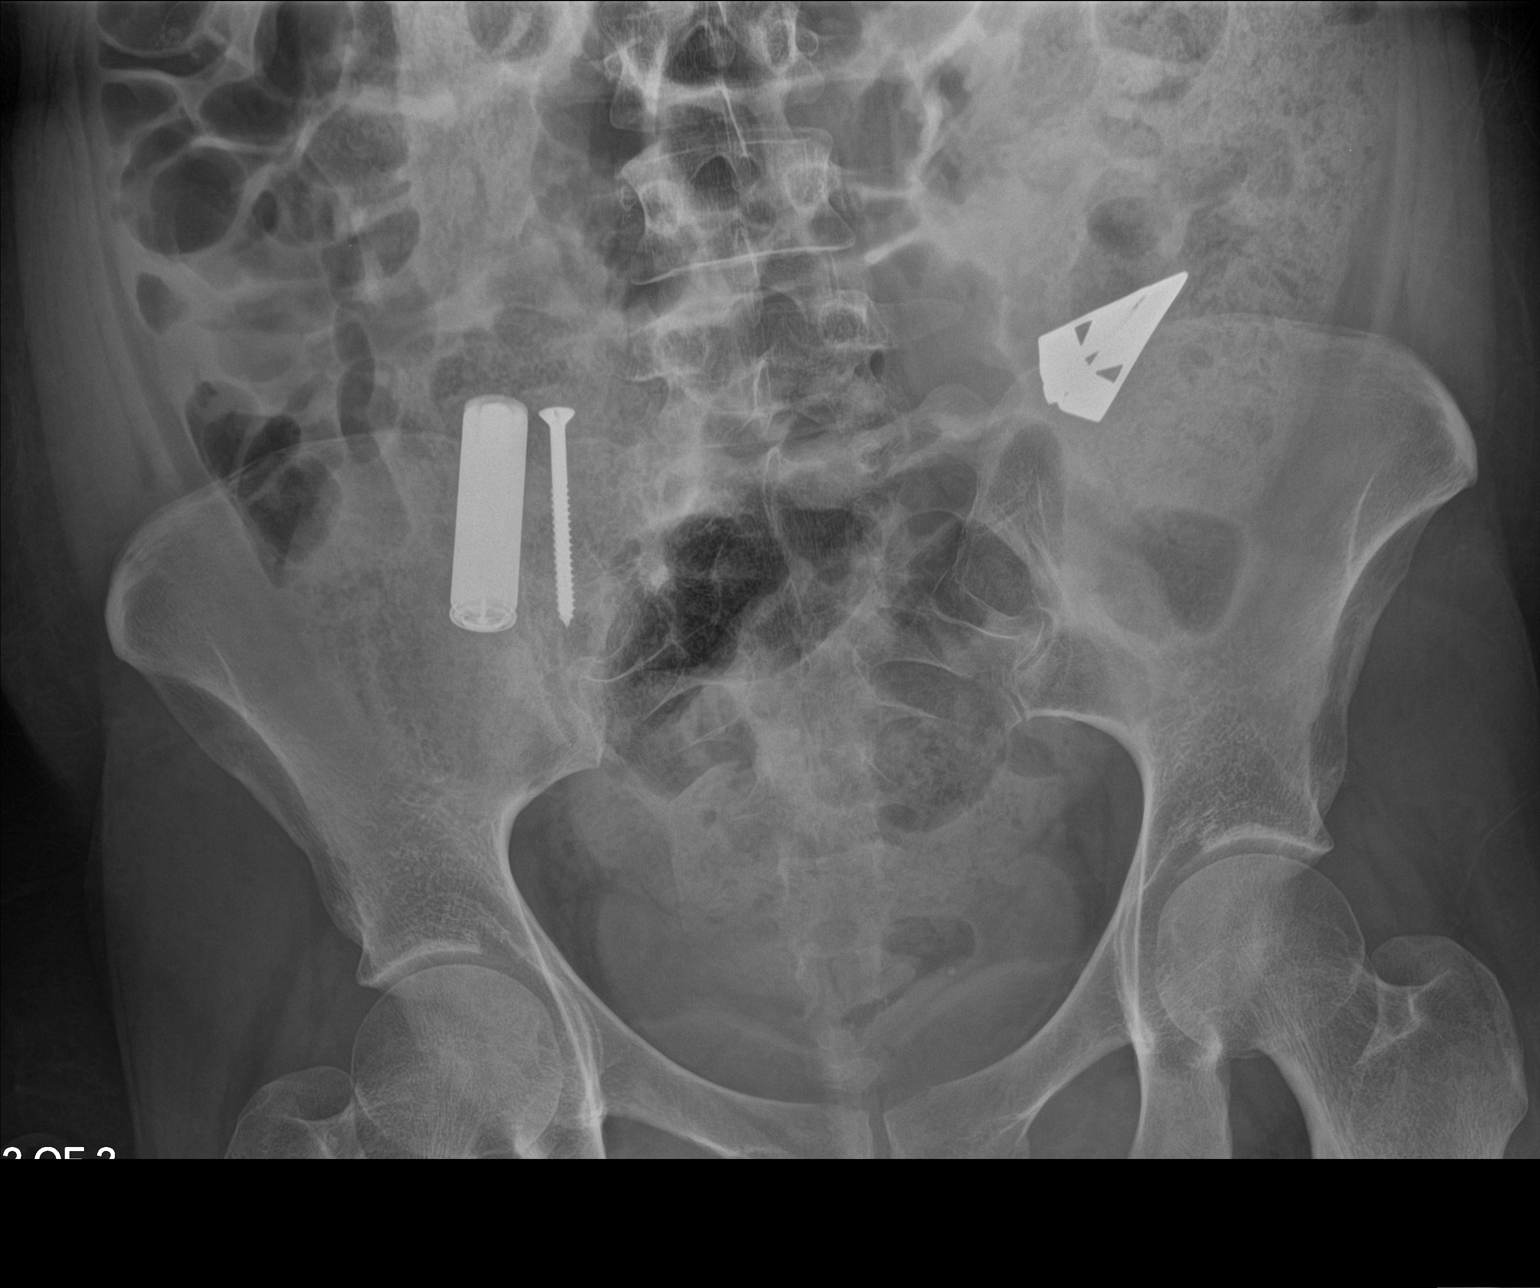

[abdomen kub (2 of 2)]
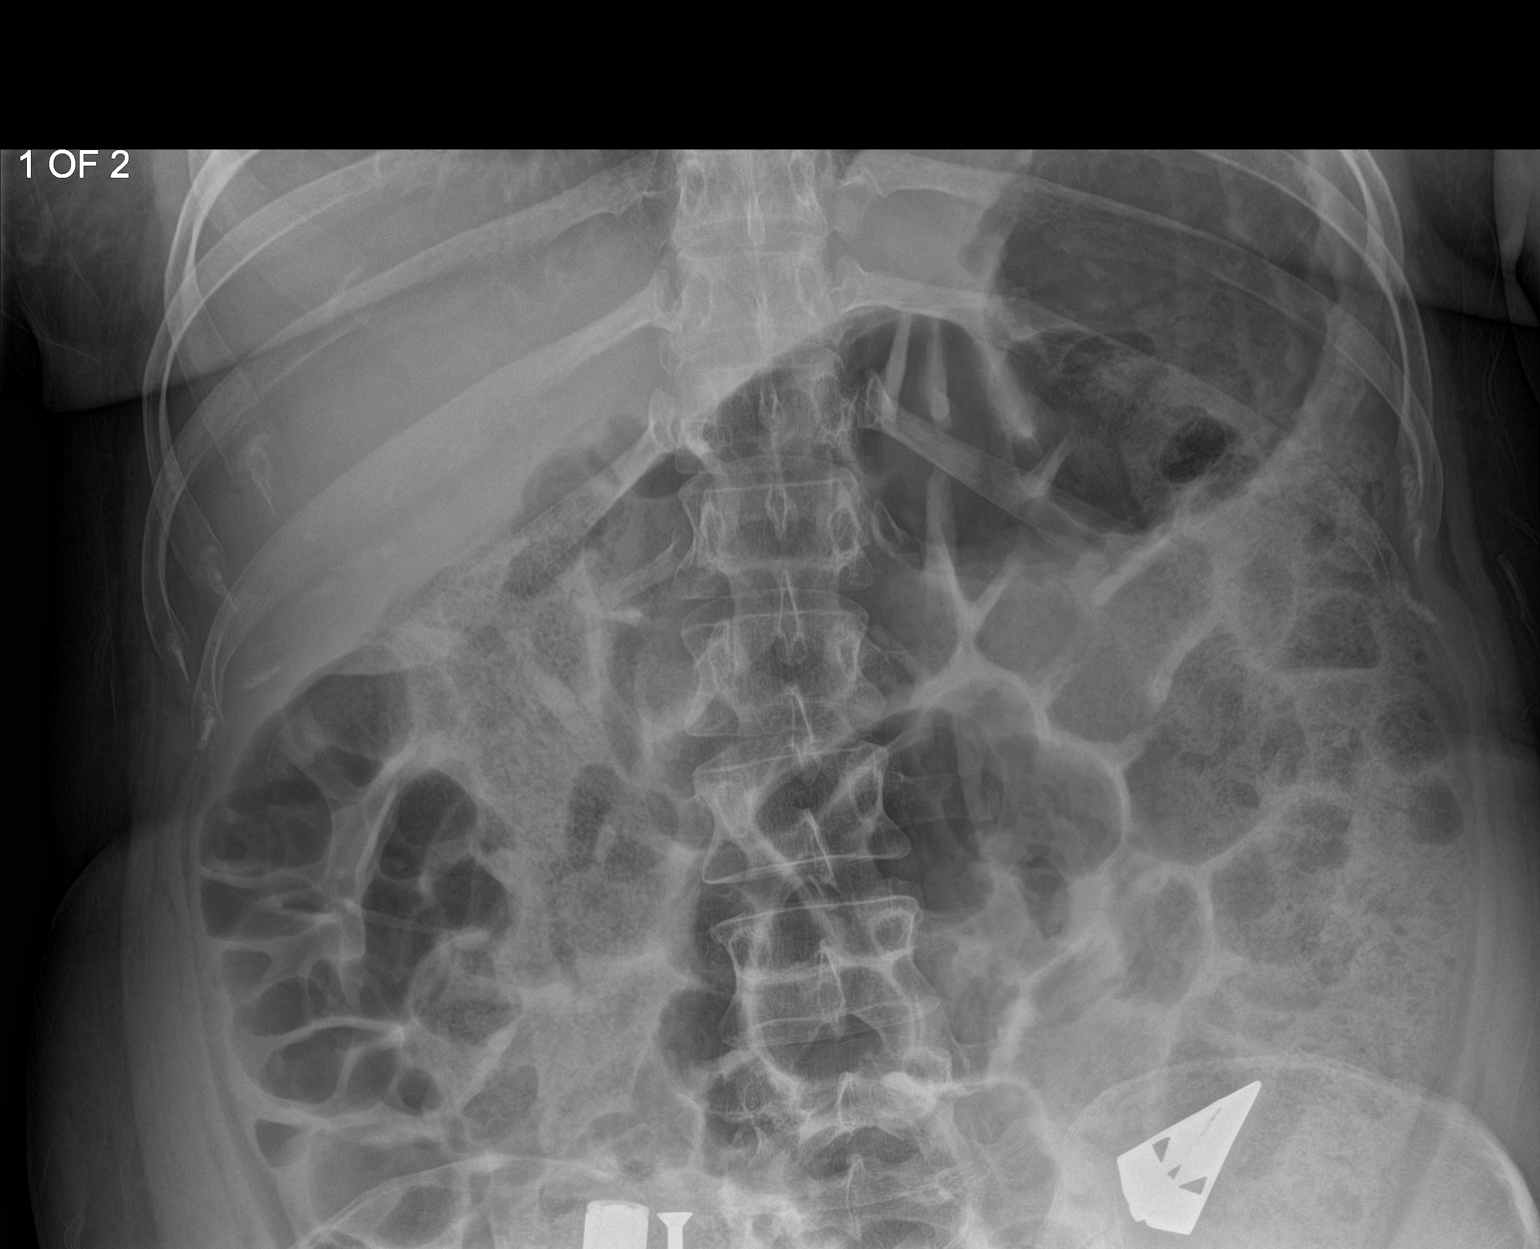

[2 of 2 positions shown; findings below may reference images not displayed]

FINDINGS: 2 supine views. Wires like foreign objects are again identified
projecting over the lower mediastinum, likely within the distal
esophagus.

The screw and battery again project in the right lower quadrant,
likely in the region of the distal ileum. The earring projects in
the left upper pelvis. Large colonic stool burden. No gross free
intraperitoneal air or bowel obstruction.
IMPRESSION: Radiopaque foreign objects. The earring currently projects in the
left pelvis, possibly within small bowel or the descending colon.
The right lower quadrant radiopaque objects are again likely in the
distal small bowel. Linear foreign objects are likely in the distal
esophagus as before.

## 2015-08-15 NOTE — Plan of Care (Signed)
Problem: Alteration in thought process Goal: LTG-Patient verbalizes understanding importance med regimen (Patient verbalizes understanding of importance of medication regimen and need to continue outpatient care.)  Outcome: Progressing Pt compliant with medication regimen

## 2015-08-15 NOTE — H&P (Signed)
Melanie Bell is an 25 y.o. female.   Chief Complaint: forgein body ingestions HPI: 25 yr old female s/p ingestion of battery, earring and screw on 3/16.  Stat consult called to me by  Melanie Bell of psychiatry for increased abdominal pain.  Patient denies pain currently.  States that earlier had some cramping pain that would come and go across abdomen.  She had two BM today and is not sure about yesterday.  She has not passed the forgien bodies.   Past Medical History  Diagnosis Date  . Hallucinations 09/30/2014    Sizoaffective  . Depression   . Anxiety   . Hypertension   . Tardive dyskinesia 10/2014    recent onset  . GERD (gastroesophageal reflux disease)   . Hyperlipidemia     Past Surgical History  Procedure Laterality Date  . Wisdom tooth extraction    . Esophagogastroduodenoscopy N/A 11/28/2014    Procedure: ESOPHAGOGASTRODUODENOSCOPY (EGD);  Surgeon: [name], MD;  Location: [facility] ENDOSCOPY;  Service: Endoscopy;  Laterality: N/A;  . Abdominal surgery      "years ago" to remove foreign objects  . Breast lumpectomy      Family History  Problem Relation Age of Onset  . Depression Mother   . Hypertension Mother    Social History:  reports that she has been smoking Cigarettes.  She has a 1.5 pack-year smoking history. She has never used smokeless tobacco. She reports that she does not drink alcohol or use illicit drugs.  Allergies:  Allergies  Allergen Reactions  . Betadine [Povidone Iodine] Other (See Comments)    Reaction:  Unknown   . Shellfish-Derived Products Other (See Comments)    Reaction:  Unknown   . Iodine Rash    Medications Prior to Admission  Medication Sig Dispense Refill  . acetaminophen (TYLENOL) 325 MG tablet Take 2 tablets (650 mg total) by mouth every 6 (six) hours as needed for mild pain. 30 tablet 0  . alum & mag hydroxide-simeth (MAALOX/MYLANTA) 200-200-20 MG/5ML suspension Take 30 mLs by mouth every 4 (four) hours as needed for  indigestion. 355 mL 0  . amLODipine (NORVASC) 2.5 MG tablet Take 1 tablet (2.5 mg total) by mouth daily. 30 tablet 0  . benztropine (COGENTIN) 1 MG tablet Take 1 tablet (1 mg total) by mouth at bedtime. 30 tablet 0  . docusate sodium (COLACE) 100 MG capsule Take 2 capsules (200 mg total) by mouth 2 (two) times daily. 120 capsule 0  . fluPHENAZine (PROLIXIN) 5 MG tablet Take 1 tablet (5 mg total) by mouth 3 (three) times daily. 90 tablet 0  . ibuprofen (ADVIL,MOTRIN) 800 MG tablet Take 800 mg by mouth every 8 (eight) hours as needed for moderate pain.     Marland Kitchen levothyroxine (SYNTHROID, LEVOTHROID) 75 MCG tablet Take 1 tablet (75 mcg total) by mouth daily before breakfast. 30 tablet 0  . Lurasidone HCl 120 MG TABS Take 1 tablet (120 mg total) by mouth daily with supper. 30 tablet 0  . OXcarbazepine (TRILEPTAL) 150 MG tablet Take 1 tablet (150 mg total) by mouth 2 (two) times daily. 60 tablet 0  . traZODone (DESYREL) 50 MG tablet Take 1 tablet (50 mg total) by mouth at bedtime. 30 tablet 0  . venlafaxine XR (EFFEXOR-XR) 150 MG 24 hr capsule Take 1 capsule (150 mg total) by mouth daily with breakfast. 30 capsule 0    No results found for this or any previous visit (from the past 48 hour(s)). Dg Abd  1 View  08/15/2015  CLINICAL DATA:  Swallowing foreign objects. Follow-up. Abdominal pain. EXAM: ABDOMEN - 1 VIEW COMPARISON:  1 day prior FINDINGS: 2 supine views. Wires like foreign objects are again identified projecting over the lower mediastinum, likely within the distal esophagus. The screw and battery again project in the right lower quadrant, likely in the region of the distal ileum. The earring projects in the left upper pelvis. Large colonic stool burden. No gross free intraperitoneal air or bowel obstruction. IMPRESSION: Radiopaque foreign objects. The earring currently projects in the left pelvis, possibly within small bowel or the descending colon. The right lower quadrant radiopaque objects are again  likely in the distal small bowel. Linear foreign objects are likely in the distal esophagus as before. Electronically Signed   By: Abigail Miyamoto M.D.   On: 08/15/2015 22:13    Review of Systems  Constitutional: Negative for fever, chills, malaise/fatigue and diaphoresis.  HENT: Negative for congestion.   Respiratory: Negative for cough, shortness of breath and wheezing.   Cardiovascular: Negative for chest pain and leg swelling.  Gastrointestinal: Negative for heartburn, nausea, vomiting, abdominal pain, diarrhea and constipation.  Genitourinary: Negative for dysuria, frequency and hematuria.  Musculoskeletal: Negative for myalgias.  Skin: Negative for itching and rash.  Neurological: Negative for dizziness, loss of consciousness, weakness and headaches.  Psychiatric/Behavioral: The patient is not nervous/anxious.   All other systems reviewed and are negative.   Blood pressure 118/74, pulse 86, temperature 98.4 F (36.9 C), temperature source Oral, resp. rate 20, height 5\' 6"  (1.676 m), weight 190 lb (86.183 kg), SpO2 100 %. Physical Exam  Vitals reviewed. Constitutional: She is oriented to person, place, and time. She appears well-developed and well-nourished. No distress.  HENT:  Head: Normocephalic and atraumatic.  Right Ear: External ear normal.  Left Ear: External ear normal.  Nose: Nose normal.  Mouth/Throat: Oropharynx is clear and moist. No oropharyngeal exudate.  Eyes: Conjunctivae and EOM are normal. Pupils are equal, round, and reactive to light. No scleral icterus.  Neck: Normal range of motion. Neck supple. No tracheal deviation present.  Cardiovascular: Normal rate, regular rhythm, normal heart sounds and intact distal pulses.  Exam reveals no gallop and no friction rub.   No murmur heard. Respiratory: Effort normal and breath sounds normal. No respiratory distress. She has no wheezes. She has no rales.  GI: Soft. Bowel sounds are normal. She exhibits no distension.  There is no tenderness. There is no rebound and no guarding.  Non-tender to palpation, no rebound, guarding or masses  Musculoskeletal: Normal range of motion. She exhibits no edema or tenderness.  Neurological: She is alert and oriented to person, place, and time. No cranial nerve deficit.  Skin: Skin is warm and dry. No rash noted. No erythema. No pallor.  Psychiatric: She has a normal mood and affect. Her behavior is normal. Judgment and thought content normal.     Assessment/Plan 25 yr old female s/p ingestion of 3 foreign bodies on 3/16.  She has been followed by GI and has been getting regular KUB films.  I have personally reviewed her last labs, which were normal.  I have also reviewed her past images from this admission with a significant stool burden in the colon which has not changed.  There is no indication for surgical intervention as there is no evidence of obstruction or perforation.  I would recommend an intensive bowel regimen to facilitate passage of the objects.   [name], MD 08/15/2015, 11:53  PM

## 2015-08-15 NOTE — Progress Notes (Signed)
[facility] MD Progress Note  08/15/2015 6:11 PM Melanie Bell  MRN:  [id number]   Subjective:    The patient remains fairly calm and cooperative on the unit. He says that MiraLAX causing her to have more bowel movements that she has not yet noticed the battery to past. She says that she swallowed a battery because there have been multiple anniversaries of people who have passed away over the past one month. She denies any current active or passive suicidal thoughts but the time that she swallowed the batteries, she was having some passive suicidal thoughts. The patient denies any psychotic symptoms currently. She has been attending groups and working on coping skills that can services distraction techniques when she feels like she wants to impulsively swallow something. Times spent discussing alternative techniques such as journaling or chewing on carrots/celery if needed. The patient is tolerating psychotropic medications fairly well. Appetite is good and she slept over 7 hours last night. No somatic complaints.   Supportive psychotherapy provided and times spent discussing ways to curb impulsivity. Times spent discussing distraction techniques to help with anxiety and suicidal thoughts. The patient was encouraged to start making a list of alternative distraction techniques that she can use when feeling like she wants to impulsively swallow objects.   Past psychiatric history. The patient has been hospitalized numerous times for mood instability, suicidal thinking and swallowing of foreign objects. Recently she seems to develop better coping skills and has been able to come to the hospital before she actually swallows unfavorable objects. She has been tried on multiple medications. Seems that the current regimen keeps her stable.   Family psychiatric history. Mother with depression.   Social history. She is an incompetent adult. She lives in a group home. Her home life was disrupted when her mother  divorced her stepfather. She lost contact with her step grandmother who was very important to her. Her mother who is her guardian was diagnosed with with MS. She lives in [place] now.    Principal Problem: Schizoaffective disorder, bipolar type ([place]) Diagnosis:   Patient Active Problem List   Diagnosis Date Noted  . Foreign body ingestion [T18.9XXA] 07/31/2015  . Schizoaffective disorder, bipolar type ([place]) [F25.0] 11/06/2014  . Tardive dyskinesia [G24.01] 10/06/2014  . Tobacco use disorder [F17.200] 09/30/2014  . Hypothyroidism [E03.9] 09/29/2014  . Hypertension [I10] 09/29/2014  . Constipation [K59.00] 09/29/2014   Total Time spent with patient: 30 minutes    Past Medical History:  Past Medical History  Diagnosis Date  . Hallucinations 09/30/2014    Sizoaffective  . Depression   . Anxiety   . Hypertension   . Tardive dyskinesia 10/2014    recent onset  . GERD (gastroesophageal reflux disease)   . Hyperlipidemia     Past Surgical History  Procedure Laterality Date  . Wisdom tooth extraction    . Esophagogastroduodenoscopy N/A 11/28/2014    Procedure: ESOPHAGOGASTRODUODENOSCOPY (EGD);  Surgeon: [name], MD;  Location: [facility] ENDOSCOPY;  Service: Endoscopy;  Laterality: N/A;  . Abdominal surgery      "years ago" to remove foreign objects  . Breast lumpectomy     Family History:  Family History  Problem Relation Age of Onset  . Depression Mother   . Hypertension Mother     Social History:  History  Alcohol Use No     History  Drug Use No    Social History   Social History  . Marital Status: Single    Spouse Name: N/A  .  Number of Children: N/A  . Years of Education: N/A   Social History Main Topics  . Smoking status: Current Every Day Smoker -- 0.50 packs/day for 3 years    Types: Cigarettes  . Smokeless tobacco: Never Used  . Alcohol Use: No  . Drug Use: No  . Sexual Activity: No   Other Topics Concern  . None   Social History Narrative    Additional Social History:    Pain Medications: See PTA Prescriptions: See PTA Over the Counter: See PTA History of alcohol / drug use?: No history of alcohol / drug abuse Longest period of sobriety (when/how long): No history of use                    Sleep: Good  Appetite:  Good  Current Medications: Current Facility-Administered Medications  Medication Dose Route Frequency Provider Last Rate Last Dose  . acetaminophen (TYLENOL) tablet 650 mg  650 mg Oral Q6H PRN [name], MD   650 mg at 08/09/15 2143  . alum & mag hydroxide-simeth (MAALOX/MYLANTA) 200-200-20 MG/5ML suspension 30 mL  30 mL Oral Q4H PRN [name], MD   30 mL at 08/13/15 2107  . amLODipine (NORVASC) tablet 2.5 mg  2.5 mg Oral Daily [name], MD   2.5 mg at 08/15/15 0945  . benztropine (COGENTIN) tablet 1 mg  1 mg Oral QHS [name], MD   1 mg at 08/14/15 2149  . docusate sodium (COLACE) capsule 200 mg  200 mg Oral BID [name], MD   200 mg at 08/15/15 0945  . fluPHENAZine (PROLIXIN) tablet 5 mg  5 mg Oral 3 times per day [name], MD   5 mg at 08/15/15 1505  . lactulose (CHRONULAC) 10 GM/15ML solution 30 g  30 g Oral BID [name], MD   30 g at 08/14/15 0925  . levothyroxine (SYNTHROID, LEVOTHROID) tablet 75 mcg  75 mcg Oral QAC breakfast [name], MD   75 mcg at 08/15/15 [phone]  . lurasidone (LATUDA) tablet 120 mg  120 mg Oral Q breakfast [name], MD   120 mg at 08/15/15 [id number]  . magnesium hydroxide (MILK OF MAGNESIA) suspension 30 mL  30 mL Oral Daily PRN [name], MD   30 mL at 08/09/15 2143  . nicotine (NICODERM CQ - dosed in mg/24 hours) patch 21 mg  21 mg Transdermal Daily [name] [name], MD   21 mg at 08/15/15 0945  . Oxcarbazepine (TRILEPTAL) tablet 150 mg  150 mg Oral BID [name], MD   150 mg at 08/15/15 0944  . polyethylene glycol (MIRALAX / GLYCOLAX) packet 17 g  17 g Oral BID [name], MD   17 g at 08/15/15 0944  . senna  (SENOKOT) tablet 17.2 mg  2 tablet Oral QHS [name], MD   17.2 mg at 08/14/15 2149  . traZODone (DESYREL) tablet 100 mg  100 mg Oral QHS [name], MD   100 mg at 08/14/15 2149  . venlafaxine XR (EFFEXOR-XR) 24 hr capsule 150 mg  150 mg Oral Q breakfast [name], MD   150 mg at 08/15/15 [id number]    Lab Results: No results found for this or any previous visit (from the past 48 hour(s)).  Blood Alcohol level:  Lab Results  Component Value Date   [facility] <5 07/30/2015   ETH <5 07/13/2015    Physical  Findings: AIMS: Facial and Oral Movements Muscles of Facial Expression: None, normal Lips and Perioral Area: None, normal Jaw: None, normal Tongue: None, normal,Extremity Movements Upper (arms, wrists, hands, fingers): None, normal Lower (legs, knees, ankles, toes): None, normal, Trunk Movements Neck, shoulders, hips: None, normal, Overall Severity Severity of abnormal movements (highest score from questions above): None, normal Incapacitation due to abnormal movements: None, normal Patient's awareness of abnormal movements (rate only patient's report): Aware, no distress, Dental Status Current problems with teeth and/or dentures?: No Does patient usually wear dentures?: No  CIWA:    COWS:     Musculoskeletal: Strength & Muscle Tone: within normal limits Gait & Station: normal Patient leans: N/A  Psychiatric Specialty Exam: Review of Systems  Constitutional: Negative.  Negative for fever, chills, weight loss, malaise/fatigue and diaphoresis.  HENT: Negative.  Negative for congestion, ear discharge, ear pain, hearing loss, nosebleeds, sore throat and tinnitus.   Eyes: Negative.  Negative for blurred vision, double vision, photophobia, pain, discharge and redness.  Respiratory: Negative.  Negative for cough, hemoptysis, shortness of breath and wheezing.   Cardiovascular: Negative.  Negative for chest pain, palpitations, orthopnea, claudication and leg swelling.   Gastrointestinal:       Increased bowel movements in past 24 hours but still not passed the battery. No abdominal pain, nausea or vomitting. No blood in stool.  Genitourinary: Negative.  Negative for dysuria, urgency and frequency.  Musculoskeletal: Negative.  Negative for myalgias, back pain, joint pain, falls and neck pain.  Skin: Negative.  Negative for itching and rash.  Neurological: Negative.  Negative for dizziness, tingling, tremors, sensory change, speech change, focal weakness, seizures, loss of consciousness and headaches.  Endo/Heme/Allergies: Negative.  Negative for environmental allergies. Does not bruise/bleed easily.    Blood pressure 117/75, pulse 78, temperature 98.4 F (36.9 C), temperature source Oral, resp. rate 20, height 5\' 6"  (1.676 m), weight 86.183 kg (190 lb), SpO2 98 %.Body mass index is 30.68 kg/(m^2).  General Appearance: Casual  Eye Contact::  Good  Speech:  Clear and Coherent and Normal Rate  Volume:  Normal  Mood:  Euthymic  Affect:  Congruent  Thought Process:  Coherent, Linear and Logical  Orientation:  Full (Time, Place, and Person)  Thought Content:  Negative  Suicidal Thoughts:  No  Homicidal Thoughts:  No  Memory:  Immediate;   Good Recent;   Good Remote;   Good  Judgement:  Poor  Insight:  Lacking  Psychomotor Activity:  Normal  Concentration:  Good  Recall:  Good  Fund of Knowledge:Good  Language: Good  Akathisia:  Negative  Handed:  Right  AIMS (if indicated):     Assets:  Communication Skills Desire for Improvement Housing Transportation  ADL's:  Intact  Cognition: WNL  Sleep:  Number of Hours: 7   Treatment Plan Summary:   [name] is a 25 year old female with a history of schizoaffective disorder admitted for worsening of depression and suicide attempt by swallowing an AA battery, an earing and a screw.   1. Suicidal ideation. This has resolved. The patient is able to contract for safety. She is forward thinking and  optimistic about the future.    2. Mood and psychosis. We continued Prolixin 5mg  po TID and Latuda 120mg  po daily for psychosis, Trileptal 150mg  po BID for mood stabilization, and Effexor XR 150mg  po daily for depression.   3. Hypertensio: VSS. She is on amlodipine 2.5mg  po daily.  4. Constipation. She is on bowel regimen for constipation including  Miralax and bowels are moving.  5. Hypothyroidism. She is on Synthroid 32mcg po daily  6. Smoking. Nicotine patch was available.  7. Insomnia. She is on trazodone 100mg  po nightly as needed for slep  8. Social. She is an incompetent adult. Her Kara Dies is the guardian. She recently moved to [place].  9. Metabolic syndrome. Lipid panel, hemoglobin A1c, TSH, and PRL were checked during previous admission and were normal. Prolactin 15.   10. Foreign body ingestion. There are three foreign objects present in the colon on X-ray with questionable anterograde progression showing on follow up study. She injested an AA battery on 3/16. She will continue bowel regimen and Lactuose. Input from GI and surgery is greatly appreciated. Spoke with Dr. [name], GI. There will be no surgical intervention unless there is perforation/peritonitis. Another X-ray is planned for Monday.Marland Kitchen Apparently, such battery removal should be completed within 7 days of injestion, see GI note.  11. Disposition. To be established.     Daily contact with patient to assess and evaluate symptoms and progress in treatment and Medication management  Jay Schlichter, MD 08/15/2015, 6:11 PM [facility] MD Progress Note  08/15/2015 6:13 PM [name]  MRN:  [name]  Subjective:  [name] is a 25 year old female with a history of schizoaffective disorder. She was briefly admitted to medical floor after she swallowed 3 foreign objects including a screw, AA battery and an earring. She was transferred to psychiatry before she passed them. The battery has been there since March 15.  Apparently about that he should not stick around the gap for longer than a week according to GI note. She is now being followed by Dr. [name], GI. Surgery refuses to operate until there is perforation and/or peritonitis. The patient is no longer in need of psychiatric care but I could not discharge her to home given her medical condition. She is on liquid diet and laxatives.   [name] denies any symptoms of depression, anxiety, or psychosis. She denies any abdominal pain. In spite over multiple laxatives and lactulose she has not had a bowel movement yesterday. We will give mag citrate again today as her x-ray indicates massive amounts of stool in the bowels. She accepts medications and tolerates them well. She participates well in programming.  Principal Problem: Schizoaffective disorder, bipolar type ([place]) Diagnosis:   Patient Active Problem List   Diagnosis Date Noted  . Foreign body ingestion [T18.9XXA] 07/31/2015  . Schizoaffective disorder, bipolar type ([place]) [F25.0] 11/06/2014  . Tardive dyskinesia [G24.01] 10/06/2014  . Tobacco use disorder [F17.200] 09/30/2014  . Hypothyroidism [E03.9] 09/29/2014  . Hypertension [I10] 09/29/2014  . Constipation [K59.00] 09/29/2014   Total Time spent with patient: 20 minutes  Past Psychiatric History: schizoaffective disorder.  Past Medical History:  Past Medical History  Diagnosis Date  . Hallucinations 09/30/2014    Sizoaffective  . Depression   . Anxiety   . Hypertension   . Tardive dyskinesia 10/2014    recent onset  . GERD (gastroesophageal reflux disease)   . Hyperlipidemia     Past Surgical History  Procedure Laterality Date  . Wisdom tooth extraction    . Esophagogastroduodenoscopy N/A 11/28/2014    Procedure: ESOPHAGOGASTRODUODENOSCOPY (EGD);  Surgeon: [name], MD;  [facility] ENDOSCOPY;  Service: Endoscopy;  Laterality: N/A;  . Abdominal surgery      "years ago" to remove foreign objects  . Breast lumpectomy      Family History:  Family History  Problem Relation Age of Onset  .  Depression Mother   . Hypertension Mother    Family Psychiatric  History: depression. Social History:  History  Alcohol Use No     History  Drug Use No    Social History   Social History  . Marital Status: Single    Spouse Name: N/A  . Number of Children: N/A  . Years of Education: N/A   Social History Main Topics  . Smoking status: Current Every Day Smoker -- 0.50 packs/day for 3 years    Types: Cigarettes  . Smokeless tobacco: Never Used  . Alcohol Use: No  . Drug Use: No  . Sexual Activity: No   Other Topics Concern  . None   Social History Narrative   Additional Social History:    Pain Medications: See PTA Prescriptions: See PTA Over the Counter: See PTA History of alcohol / drug use?: No history of alcohol / drug abuse Longest period of sobriety (when/how long): No history of use                    Sleep: Fair  Appetite:  Fair  Current Medications: Current Facility-Administered Medications  Medication Dose Route Frequency Provider Last Rate Last Dose  . acetaminophen (TYLENOL) tablet 650 mg  650 mg Oral Q6H PRN [name], MD   650 mg at 08/09/15 2143  . alum & mag hydroxide-simeth (MAALOX/MYLANTA) 200-200-20 MG/5ML suspension 30 mL  30 mL Oral Q4H PRN [name], MD   30 mL at 08/13/15 2107  . amLODipine (NORVASC) tablet 2.5 mg  2.5 mg Oral Daily [name], MD   2.5 mg at 08/15/15 0945  . benztropine (COGENTIN) tablet 1 mg  1 mg Oral QHS [name], MD   1 mg at 08/14/15 2149  . docusate sodium (COLACE) capsule 200 mg  200 mg Oral BID [name], MD   200 mg at 08/15/15 0945  . fluPHENAZine (PROLIXIN) tablet 5 mg  5 mg Oral 3 times per day [name], MD   5 mg at 08/15/15 1505  . lactulose (CHRONULAC) 10 GM/15ML solution 30 g  30 g Oral BID [name], MD   30 g at 08/14/15 0925  . levothyroxine (SYNTHROID, LEVOTHROID) tablet 75 mcg  75 mcg Oral  QAC breakfast [name], MD   75 mcg at 08/15/15 [phone]  . lurasidone (LATUDA) tablet 120 mg  120 mg Oral Q breakfast [name], MD   120 mg at 08/15/15 [id number]  . magnesium hydroxide (MILK OF MAGNESIA) suspension 30 mL  30 mL Oral Daily PRN [name], MD   30 mL at 08/09/15 2143  . nicotine (NICODERM CQ - dosed in mg/24 hours) patch 21 mg  21 mg Transdermal Daily [name] [name], MD   21 mg at 08/15/15 0945  . Oxcarbazepine (TRILEPTAL) tablet 150 mg  150 mg Oral BID [name], MD   150 mg at 08/15/15 0944  . polyethylene glycol (MIRALAX / GLYCOLAX) packet 17 g  17 g Oral BID [name], MD   17 g at 08/15/15 0944  . senna (SENOKOT) tablet 17.2 mg  2 tablet Oral QHS [name], MD   17.2 mg at 08/14/15 2149  . traZODone (DESYREL) tablet 100 mg  100 mg Oral QHS [name], MD   100 mg at 08/14/15 2149  . venlafaxine XR (EFFEXOR-XR) 24 hr capsule 150 mg  150 mg Oral Q breakfast [name]  [name], MD   150 mg at 08/15/15 [id number]    Lab Results: No results found for this or any previous visit (from the past 48 hour(s)).  Blood Alcohol level:  Lab Results  Component Value Date   ETH <5 07/30/2015   ETH <5 07/13/2015    Physical Findings: AIMS: Facial and Oral Movements Muscles of Facial Expression: None, normal Lips and Perioral Area: None, normal Jaw: None, normal Tongue: None, normal,Extremity Movements Upper (arms, wrists, hands, fingers): None, normal Lower (legs, knees, ankles, toes): None, normal, Trunk Movements Neck, shoulders, hips: None, normal, Overall Severity Severity of abnormal movements (highest score from questions above): None, normal Incapacitation due to abnormal movements: None, normal Patient's awareness of abnormal movements (rate only patient's report): Aware, no distress, Dental Status Current problems with teeth and/or dentures?: No Does patient usually wear dentures?: No  CIWA:    COWS:     Musculoskeletal: Strength & Muscle  Tone: within normal limits Gait & Station: normal Patient leans: N/A  Psychiatric Specialty Exam: Review of Systems  All other systems reviewed and are negative.   Blood pressure 117/75, pulse 78, temperature 98.4 F (36.9 C), temperature source Oral, resp. rate 20, height 5\' 6"  (1.676 m), weight 86.183 kg (190 lb), SpO2 98 %.Body mass index is 30.68 kg/(m^2).  General Appearance: Casual  Eye Contact::  Good  Speech:  Clear and Coherent  Volume:  Normal  Mood:  Euthymic  Affect:  Appropriate  Thought Process:  Goal Directed  Orientation:  Full (Time, Place, and Person)  Thought Content:  WDL  Suicidal Thoughts:  No  Homicidal Thoughts:  No  Memory:  Immediate;   Fair Recent;   Fair Remote;   Fair  Judgement:  Poor  Insight:  Lacking  Psychomotor Activity:  Normal  Concentration:  Fair  Recall:  [employer] of Knowledge:Fair  Language: Fair  Akathisia:  No  Handed:  Right  AIMS (if indicated):     Assets:  Communication Skills Desire for Improvement Financial Resources/Insurance Housing Resilience Social Support  ADL's:  Intact  Cognition: WNL  Sleep:  Number of Hours: 7   Treatment Plan Summary: Daily contact with patient to assess and evaluate symptoms and progress in treatment  [name] is a 25 year old female with a history of schizoaffective disorder admitted for worsening of depression and suicide attempt by swallowing an AA battery, an earing and a screw.   1. Suicidal ideation. This has resolved. The patient is able to contract for safety. She is forward thinking and optimistic about the future.    2. Mood and psychosis. We continued Prolixin and Latuda for psychosis, Trileptal for mood stabilization, and Effexor for depression.   3. Hypertension. She is on amlodipine.  4. Constipation. She is on bowel regimen for constipation. No narcotics were given for pain.  5. Hypothyroidism. She is on Synthroid.  6. Smoking. Nicotine patch was  available.  7. Insomnia. She is on trazodone.  8. Social. She is an incompetent adult. Her Kara Dies is the guardian. She recently moved to [place].  9. Metabolic syndrome. Lipid panel, hemoglobin A1c, TSH, and PRL were checked during previous admission and were normal. Prolactin 15.   10. Foreign body ingestion. There are three foreign objects present in the colon on X-ray with questionable anterograde progression showing on follow up study. She injested an AA battery on 3/16. She will continue bowel regimen and Lactuose. Input from GI and surgery is greatly appreciated. Spoke with Dr. [name], GI. There  will be no surgical intervention unless there is perforation/peritonitis. Another X-ray is planned. Apparently, such battery removal should be completed within 7 days of injestion, see GI note.  11. Disposition. To be established.    Jay Schlichter, MD 08/15/2015, 6:13 PM

## 2015-08-15 NOTE — Plan of Care (Signed)
Problem: Diagnosis: Increased Risk For Suicide Attempt Goal: LTG-Patient Will Report Improved Mood and Deny Suicidal LTG (by discharge) Patient will report improved mood and deny suicidal ideation.  Outcome: Progressing Patient reports feeling much better and denies any suicidal ideation

## 2015-08-15 NOTE — Progress Notes (Signed)
D: Observed pt interacting in day room. Patient alert and oriented x4. Patient denies SI/HI/AVH. Pt affect is appropriate. Pt rated depression 1/10 and anxiety 2/10. Pt stated "I'm doing much better" since being admitted. Pt endorsed her increased use of coping skills such as journaling and art as the reason for her improvement. Pt also endorsed having a bowel movement. Stool was brown and loose. No foreign objects were found in stool. A: Offered active listening and support. Provided therapeutic communication. Administered scheduled medications. Searched through stool for presence of foreign objects. Held lactulose per order parameters, as patient had bowel movement. R: Pt pleasant and cooperative. Pt medication compliant. Will continue Q15 min. checks. Safety maintained.

## 2015-08-15 NOTE — Progress Notes (Signed)
The patient started to complain of severe abdominal pain worse in the RUQ and pelvic region bilaterally. On exam, no bowel sounds heard. She was reporting nausea but no vomiting. Dr Tiffany Kocher from GI called and he recommended a STAT consult with Surgery. Dr Azalee Course from surgery paged and she agreed to see the patient. Abdominal Xray ordered as well. VSS. The patient reports that she swallowed an earring and a screw prior to swallowing the battery prior to admission.

## 2015-08-15 NOTE — [facility] Group Notes (Signed)
[place] LCSW Group Therapy  08/15/2015 3:26 PM  Type of Therapy:  Group Therapy  Participation Level:  Minimal  Participation Quality:  Attentive  Affect:  Appropriate  Cognitive:  Alert  Insight:  Improving  Engagement in Therapy:  Improving  Modes of Intervention:  Discussion, Education, Socialization and Support  Summary of Progress/Problems: Pt will identify unhealthy thoughts and how they impact their emotions and behavior. Pt will be encouraged to discuss these thoughts, emotions and behaviors with the group. Pt attended group and stayed the entire time. Pt sat quietly and listened to other group members.   [place] MSW, [place]  08/15/2015, 3:26 PM

## 2015-08-15 NOTE — Progress Notes (Signed)
D:  Patient is alert and oriented on the unit this shift.  Patient attended and actively participated in groups today.  Patient denies suicidal ideation, homicidal ideation, auditory or visual hallucinations at the present time.  Patient had a bowel movement this afternoon and no foreign objects were found. A:  Scheduled medications are administered to patient as per MD orders.  Emotional support and encouragement are provided.  Patient is maintained on q.15 minute safety checks.  Patient is informed to notify staff with questions or concerns. R:  No adverse medication reactions are noted.  Patient is cooperative with medication administration and treatment plan today.  Patient is receptive, calm and cooperative on the unit at this time.  Patient interacts well with others on the unit this shift.  Patient contracts for safety at this time.  Patient remains safe at this time.

## 2015-08-15 NOTE — [facility] Group Notes (Signed)
[place] Group Notes:  (Nursing/MHT/Case Management/Adjunct)  Date:  08/15/2015  Time:  9:19 AM  Type of Therapy:  Community   Participation Level:  Did Not Attend  Melanie Bell Melanie Bell 08/15/2015, 9:19 AM

## 2015-08-15 NOTE — Progress Notes (Signed)
Patient had a second bowel movement and no foreign objects were found.

## 2015-08-15 NOTE — Plan of Care (Signed)
Problem: Alteration in thought process Goal: LTG-Patient has not harmed self or others in at least 2 days Outcome: Progressing Patient has not harmed self in at least 2 days

## 2015-08-16 MED ORDER — POLYETHYLENE GLYCOL 3350 17 GM/SCOOP PO POWD
1.0000 | Freq: Once | ORAL | Status: AC
  Administered 2015-08-16: 255 g via ORAL
  Filled 2015-08-16: qty 255

## 2015-08-16 MED ORDER — MILK AND MOLASSES ENEMA
1.0000 | Freq: Once | RECTAL | Status: DC
  Filled 2015-08-16: qty 250

## 2015-08-16 MED ORDER — LACTULOSE 10 GM/15ML PO SOLN
30.0000 g | Freq: Three times a day (TID) | ORAL | Status: DC
  Administered 2015-08-16: 30 g via ORAL
  Administered 2015-08-16: 10 g via ORAL
  Administered 2015-08-17 – 2015-09-10 (×72): 30 g via ORAL
  Filled 2015-08-16 (×74): qty 60

## 2015-08-16 NOTE — Plan of Care (Signed)
Problem: Diagnosis: Increased Risk For Suicide Attempt Goal: STG-Patient Will Comply With Medication Regime Outcome: Progressing Pt continues to be medication compliant

## 2015-08-16 NOTE — Progress Notes (Signed)
Patient with liquid brown stool at this time. No foreign bodies present in stool. No distress, no discomfort. Safety maintained.

## 2015-08-16 NOTE — Progress Notes (Signed)
Patient with another liquid brown stool at this time with no foreign objects passed at this time. No distress, no discomfort. Safety maintained.

## 2015-08-16 NOTE — Progress Notes (Signed)
[facility] MD Progress Note  08/16/2015 2:00 PM CASSANDRE KOLARIK  MRN:  [id number]   Subjective:    Yesterday evening, the patient started to have severe abdominal pain particularly in the left upper quadrant and pelvic region bilaterally. She was seen by surgery abdominal x-ray was completed.  P imaging showed the battery, screw engineering all within her bowel. She has not been able to pass any of the foreign objects. She had 2 bowel movements yesterday but no bowel movements in the evening. She was finally able to rest after abdominal pain decreased around midnight. The patient denies any vomiting but has been struggling with some nausea. She says her mood is "okay" he continues to deny any suicidal thoughts or psychotic symptoms. No further episodes of impulsivity on the unit and she has been cooperative and calm. She has been compliant with medications and denies any side effects associated with psychotropic medications. The patient has been attending groups. Appetite is good and she denies any problems with insomnia. Per nursing, she slept over 6 hours per nursing last night. VSS.  Surgery ordered Milk of Molasses enema and lactulose in addition to Miralax.  Past psychiatric history. The patient has been hospitalized numerous times for mood instability, suicidal thinking and swallowing of foreign objects. Recently she seems to develop better coping skills and has been able to come to the hospital before she actually swallows unfavorable objects. She has been tried on multiple medications. Seems that the current regimen keeps her stable.   Family psychiatric history. Mother with depression.   Social history. She is an incompetent adult. She lives in a group home. Her home life was disrupted when her mother divorced her stepfather. She lost contact with her step grandmother who was very important to her. Her mother who is her guardian was diagnosed with with MS. She lives in [place] now.    Principal  Problem: Schizoaffective disorder, bipolar type ([place]) Diagnosis:   Patient Active Problem List   Diagnosis Date Noted  . Foreign body ingestion [T18.9XXA] 07/31/2015  . Schizoaffective disorder, bipolar type ([place]) [F25.0] 11/06/2014  . Tardive dyskinesia [G24.01] 10/06/2014  . Tobacco use disorder [F17.200] 09/30/2014  . Hypothyroidism [E03.9] 09/29/2014  . Hypertension [I10] 09/29/2014  . Constipation [K59.00] 09/29/2014   Total Time spent with patient: 30 minutes    Past Medical History:  Past Medical History  Diagnosis Date  . Hallucinations 09/30/2014    Sizoaffective  . Depression   . Anxiety   . Hypertension   . Tardive dyskinesia 10/2014    recent onset  . GERD (gastroesophageal reflux disease)   . Hyperlipidemia     Past Surgical History  Procedure Laterality Date  . Wisdom tooth extraction    . Esophagogastroduodenoscopy N/A 11/28/2014    Procedure: ESOPHAGOGASTRODUODENOSCOPY (EGD);  Surgeon: [name], MD;  Location: [facility] ENDOSCOPY;  Service: Endoscopy;  Laterality: N/A;  . Abdominal surgery      "years ago" to remove foreign objects  . Breast lumpectomy     Family History:  Family History  Problem Relation Age of Onset  . Depression Mother   . Hypertension Mother     Social History:  History  Alcohol Use No     History  Drug Use No    Social History   Social History  . Marital Status: Single    Spouse Name: N/A  . Number of Children: N/A  . Years of Education: N/A   Social History Main Topics  . Smoking status:  Current Every Day Smoker -- 0.50 packs/day for 3 years    Types: Cigarettes  . Smokeless tobacco: Never Used  . Alcohol Use: No  . Drug Use: No  . Sexual Activity: No   Other Topics Concern  . None   Social History Narrative   Additional Social History:    Pain Medications: See PTA Prescriptions: See PTA Over the Counter: See PTA History of alcohol / drug use?: No history of alcohol / drug abuse Longest period of  sobriety (when/how long): No history of use                    Sleep: Good  Appetite:  Good  Current Medications: Current Facility-Administered Medications  Medication Dose Route Frequency Provider Last Rate Last Dose  . acetaminophen (TYLENOL) tablet 650 mg  650 mg Oral Q6H PRN [name], MD   650 mg at 08/15/15 1952  . alum & mag hydroxide-simeth (MAALOX/MYLANTA) 200-200-20 MG/5ML suspension 30 mL  30 mL Oral Q4H PRN [name], MD   30 mL at 08/13/15 2107  . amLODipine (NORVASC) tablet 2.5 mg  2.5 mg Oral Daily [name], MD   2.5 mg at 08/16/15 0949  . benztropine (COGENTIN) tablet 1 mg  1 mg Oral QHS [name], MD   1 mg at 08/15/15 2255  . docusate sodium (COLACE) capsule 200 mg  200 mg Oral BID [name], MD   200 mg at 08/16/15 0949  . fluPHENAZine (PROLIXIN) tablet 5 mg  5 mg Oral 3 times per day [name], MD   5 mg at 08/16/15 0626  . lactulose (CHRONULAC) 10 GM/15ML solution 30 g  30 g Oral TID [name], MD   10 g at 08/16/15 0946  . levothyroxine (SYNTHROID, LEVOTHROID) tablet 75 mcg  75 mcg Oral QAC breakfast [name], MD   75 mcg at 08/16/15 0626  . lurasidone (LATUDA) tablet 120 mg  120 mg Oral Q breakfast [name], MD   120 mg at 08/16/15 0952  . magnesium hydroxide (MILK OF MAGNESIA) suspension 30 mL  30 mL Oral Daily PRN [name], MD   30 mL at 08/09/15 2143  . milk and molasses enema  1 enema Rectal Once [name], MD   250 mL at 08/16/15 1230  . nicotine (NICODERM CQ - dosed in mg/24 hours) patch 21 mg  21 mg Transdermal Daily [name], MD   21 mg at 08/16/15 0948  . Oxcarbazepine (TRILEPTAL) tablet 150 mg  150 mg Oral BID [name], MD   150 mg at 08/16/15 0951  . polyethylene glycol (MIRALAX / GLYCOLAX) packet 17 g  17 g Oral BID [name], MD   17 g at 08/16/15 0946  . senna (SENOKOT) tablet 17.2 mg  2 tablet Oral QHS [name], MD   17.2 mg at 08/15/15 2254  .  traZODone (DESYREL) tablet 100 mg  100 mg Oral QHS [name], MD   100 mg at 08/15/15 2255  . venlafaxine XR (EFFEXOR-XR) 24 hr capsule 150 mg  150 mg Oral Q breakfast [name], MD   150 mg at 08/16/15 [id number]    Lab Results: No results found for this or any previous visit (from the past 48 hour(s)).  Blood Alcohol level:  Lab Results  Component Value Date   ETH <5 07/30/2015   ETH <5 07/13/2015  Physical Findings: AIMS: Facial and Oral Movements Muscles of Facial Expression: None, normal Lips and Perioral Area: None, normal Jaw: None, normal Tongue: None, normal,Extremity Movements Upper (arms, wrists, hands, fingers): None, normal Lower (legs, knees, ankles, toes): None, normal, Trunk Movements Neck, shoulders, hips: None, normal, Overall Severity Severity of abnormal movements (highest score from questions above): None, normal Incapacitation due to abnormal movements: None, normal Patient's awareness of abnormal movements (rate only patient's report): Aware, no distress, Dental Status Current problems with teeth and/or dentures?: No Does patient usually wear dentures?: No  CIWA:    COWS:     Musculoskeletal: Strength & Muscle Tone: within normal limits Gait & Station: normal Patient leans: N/A  Psychiatric Specialty Exam: Review of Systems  Constitutional: Negative.  Negative for fever, chills, weight loss, malaise/fatigue and diaphoresis.  HENT: Negative.  Negative for congestion, ear discharge, ear pain, hearing loss, nosebleeds, sore throat and tinnitus.   Eyes: Negative.  Negative for blurred vision, double vision, photophobia, pain, discharge and redness.  Respiratory: Negative.  Negative for cough, hemoptysis, shortness of breath and wheezing.   Cardiovascular: Negative.  Negative for chest pain, palpitations, orthopnea, claudication and leg swelling.  Gastrointestinal:       Increased bowel movements in past 24 hours but still not passed the battery. She  did have abdominal pain yesterday evening but not this morning. No nausea or vomitting. No blood in stool.  Genitourinary: Negative.  Negative for dysuria, urgency and frequency.  Musculoskeletal: Negative.  Negative for myalgias, back pain, joint pain, falls and neck pain.  Skin: Negative.  Negative for itching and rash.  Neurological: Negative.  Negative for dizziness, tingling, tremors, sensory change, speech change, focal weakness, seizures, loss of consciousness and headaches.  Endo/Heme/Allergies: Negative.  Negative for environmental allergies. Does not bruise/bleed easily.    Blood pressure 118/71, pulse 81, temperature 98.8 F (37.1 C), temperature source Oral, resp. rate 20, height 5\' 6"  (1.676 m), weight 86.183 kg (190 lb), SpO2 100 %.Body mass index is 30.68 kg/(m^2).  General Appearance: Casual  Eye Contact::  Good  Speech:  Clear and Coherent and Normal Rate  Volume:  Normal  Mood:  Euthymic  Affect:  Congruent  Thought Process:  Coherent, Linear and Logical  Orientation:  Full (Time, Place, and Person)  Thought Content:  Negative  Suicidal Thoughts:  No  Homicidal Thoughts:  No  Memory:  Immediate;   Good Recent;   Good Remote;   Good  Judgement:  Poor  Insight:  Lacking  Psychomotor Activity:  Normal  Concentration:  Good  Recall:  Good  Fund of Knowledge:Good  Language: Good  Akathisia:  Negative  Handed:  Right  AIMS (if indicated):     Assets:  Communication Skills Desire for Improvement Housing Transportation  ADL's:  Intact  Cognition: WNL  Sleep:  Number of Hours: 6.75   Treatment Plan Summary:   [name] is a 25 year old female with a history of schizoaffective disorder admitted for worsening of depression and suicide attempt by swallowing an AA battery, an earing and a screw.   1. Suicidal ideation. This has resolved. The patient is able to contract for safety. She is forward thinking and optimistic about the future.    2. Mood and  psychosis. We continued Prolixin 5mg  po TID and Latuda 120mg  po daily for psychosis, Trileptal 150mg  po BID for mood stabilization, and Effexor XR 150mg  po daily for depression.   3. Hypertensio: VSS. She is on amlodipine 2.5mg  po daily.  4. Constipation. She is on bowel regimen for constipation including Miralax and bowels are moving.  5. Hypothyroidism. She is on Synthroid 54mcg po daily  6. Smoking. Nicotine patch was available.  7. Insomnia. She is on trazodone 100mg  po nightly as needed for slep  8. Social. She is an incompetent adult. Her Kara Dies is the guardian. She recently moved to [place].  9. Metabolic syndrome. Lipid panel, hemoglobin A1c, TSH, and PRL were checked during previous admission and were normal. Prolactin 15.   10. Foreign body ingestion. There are three foreign objects present in the colon on X-ray with questionable anterograde progression showing on follow up study. She injested an AA battery on 3/16. She will continue bowel regimen and Lactuose. Input from GI and surgery is greatly appreciated. Spoke with Dr. [name], GI. There will be no surgical intervention unless there is perforation/peritonitis. Another X-ray is planned for Monday.Marland Kitchen Apparently, such battery removal should be completed within 7 days of injestion, see GI note.  11. Disposition. To be established.     Daily contact with patient to assess and evaluate symptoms and progress in treatment and Medication management  Jay Schlichter, MD 08/16/2015, 2:00 PM [facility] MD Progress Note  08/16/2015 2:00 PM LYNNLEY MAGGIACOMO  MRN:  [id number]  Subjective:  Mrs. [name] is a 25 year old female with a history of schizoaffective disorder. She was briefly admitted to medical floor after she swallowed 3 foreign objects including a screw, AA battery and an earring. She was transferred to psychiatry before she passed them. The battery has been there since March 15. Apparently about that he should not stick around  the gap for longer than a week according to GI note. She is now being followed by Dr. [name], GI. Surgery refuses to operate until there is perforation and/or peritonitis. The patient is no longer in need of psychiatric care but I could not discharge her to home given her medical condition. She is on liquid diet and laxatives.   Mrs. [name] denies any symptoms of depression, anxiety, or psychosis. She denies any abdominal pain. In spite over multiple laxatives and lactulose she has not had a bowel movement yesterday. We will give mag citrate again today as her x-ray indicates massive amounts of stool in the bowels. She accepts medications and tolerates them well. She participates well in programming.  Principal Problem: Schizoaffective disorder, bipolar type ([place]) Diagnosis:   Patient Active Problem List   Diagnosis Date Noted  . Foreign body ingestion [T18.9XXA] 07/31/2015  . Schizoaffective disorder, bipolar type ([place]) [F25.0] 11/06/2014  . Tardive dyskinesia [G24.01] 10/06/2014  . Tobacco use disorder [F17.200] 09/30/2014  . Hypothyroidism [E03.9] 09/29/2014  . Hypertension [I10] 09/29/2014  . Constipation [K59.00] 09/29/2014   Total Time spent with patient: 20 minutes  Past Psychiatric History: schizoaffective disorder.  Past Medical History:  Past Medical History  Diagnosis Date  . Hallucinations 09/30/2014    Sizoaffective  . Depression   . Anxiety   . Hypertension   . Tardive dyskinesia 10/2014    recent onset  . GERD (gastroesophageal reflux disease)   . Hyperlipidemia     Past Surgical History  Procedure Laterality Date  . Wisdom tooth extraction    . Esophagogastroduodenoscopy N/A 11/28/2014    Procedure: ESOPHAGOGASTRODUODENOSCOPY (EGD);  Surgeon: [name], MD;  Location: [facility] ENDOSCOPY;  Service: Endoscopy;  Laterality: N/A;  . Abdominal surgery      "years ago" to remove foreign objects  . Breast lumpectomy     Family History:  Family History  Problem  Relation Age of Onset  . Depression Mother   . Hypertension Mother    Family Psychiatric  History: depression. Social History:  History  Alcohol Use No     History  Drug Use No    Social History   Social History  . Marital Status: Single    Spouse Name: N/A  . Number of Children: N/A  . Years of Education: N/A   Social History Main Topics  . Smoking status: Current Every Day Smoker -- 0.50 packs/day for 3 years    Types: Cigarettes  . Smokeless tobacco: Never Used  . Alcohol Use: No  . Drug Use: No  . Sexual Activity: No   Other Topics Concern  . None   Social History Narrative   Additional Social History:    Pain Medications: See PTA Prescriptions: See PTA Over the Counter: See PTA History of alcohol / drug use?: No history of alcohol / drug abuse Longest period of sobriety (when/how long): No history of use                    Sleep: Fair  Appetite:  Fair  Current Medications: Current Facility-Administered Medications  Medication Dose Route Frequency Provider Last Rate Last Dose  . acetaminophen (TYLENOL) tablet 650 mg  650 mg Oral Q6H PRN [name], MD   650 mg at 08/15/15 1952  . alum & mag hydroxide-simeth (MAALOX/MYLANTA) 200-200-20 MG/5ML suspension 30 mL  30 mL Oral Q4H PRN [name], MD   30 mL at 08/13/15 2107  . amLODipine (NORVASC) tablet 2.5 mg  2.5 mg Oral Daily [name], MD   2.5 mg at 08/16/15 0949  . benztropine (COGENTIN) tablet 1 mg  1 mg Oral QHS [name], MD   1 mg at 08/15/15 2255  . docusate sodium (COLACE) capsule 200 mg  200 mg Oral BID [name], MD   200 mg at 08/16/15 0949  . fluPHENAZine (PROLIXIN) tablet 5 mg  5 mg Oral 3 times per day [name], MD   5 mg at 08/16/15 0626  . lactulose (CHRONULAC) 10 GM/15ML solution 30 g  30 g Oral TID [name], MD   10 g at 08/16/15 0946  . levothyroxine (SYNTHROID, LEVOTHROID) tablet 75 mcg  75 mcg Oral QAC breakfast [name], MD   75 mcg at  08/16/15 0626  . lurasidone (LATUDA) tablet 120 mg  120 mg Oral Q breakfast [name], MD   120 mg at 08/16/15 0952  . magnesium hydroxide (MILK OF MAGNESIA) suspension 30 mL  30 mL Oral Daily PRN [name], MD   30 mL at 08/09/15 2143  . milk and molasses enema  1 enema Rectal Once [name], MD   250 mL at 08/16/15 1230  . nicotine (NICODERM CQ - dosed in mg/24 hours) patch 21 mg  21 mg Transdermal Daily [name], MD   21 mg at 08/16/15 0948  . Oxcarbazepine (TRILEPTAL) tablet 150 mg  150 mg Oral BID [name], MD   150 mg at 08/16/15 0951  . polyethylene glycol (MIRALAX / GLYCOLAX) packet 17 g  17 g Oral BID [name], MD   17 g at 08/16/15 0946  . senna (SENOKOT) tablet 17.2 mg  2 tablet Oral QHS [name], MD   17.2 mg at 08/15/15 2254  . traZODone (DESYREL) tablet 100 mg  100 mg Oral QHS [name], MD   100 mg at 08/15/15 2255  . venlafaxine XR (EFFEXOR-XR) 24 hr capsule 150 mg  150 mg Oral Q breakfast [name], MD   150 mg at 08/16/15 [id number]    Lab Results: No results found for this or any previous visit (from the past 48 hour(s)).  Blood Alcohol level:  Lab Results  Component Value Date   ETH <5 07/30/2015   ETH <5 07/13/2015    Physical Findings: AIMS: Facial and Oral Movements Muscles of Facial Expression: None, normal Lips and Perioral Area: None, normal Jaw: None, normal Tongue: None, normal,Extremity Movements Upper (arms, wrists, hands, fingers): None, normal Lower (legs, knees, ankles, toes): None, normal, Trunk Movements Neck, shoulders, hips: None, normal, Overall Severity Severity of abnormal movements (highest score from questions above): None, normal Incapacitation due to abnormal movements: None, normal Patient's awareness of abnormal movements (rate only patient's report): Aware, no distress, Dental Status Current problems with teeth and/or dentures?: No Does patient usually wear dentures?: No  CIWA:     COWS:     Musculoskeletal: Strength & Muscle Tone: within normal limits Gait & Station: normal Patient leans: N/A  Psychiatric Specialty Exam: Review of Systems  All other systems reviewed and are negative.   Blood pressure 118/71, pulse 81, temperature 98.8 F (37.1 C), temperature source Oral, resp. rate 20, height 5\' 6"  (1.676 m), weight 86.183 kg (190 lb), SpO2 100 %.Body mass index is 30.68 kg/(m^2).  General Appearance: Casual  Eye Contact::  Good  Speech:  Clear and Coherent  Volume:  Normal  Mood:  Euthymic  Affect:  Appropriate  Thought Process:  Goal Directed  Orientation:  Full (Time, Place, and Person)  Thought Content:  WDL  Suicidal Thoughts:  No  Homicidal Thoughts:  No  Memory:  Immediate;   Fair Recent;   Fair Remote;   Fair  Judgement:  Poor  Insight:  Lacking  Psychomotor Activity:  Normal  Concentration:  Fair  Recall:  [employer] of Knowledge:Fair  Language: Fair  Akathisia:  No  Handed:  Right  AIMS (if indicated):     Assets:  Communication Skills Desire for Improvement Financial Resources/Insurance Housing Resilience Social Support  ADL's:  Intact  Cognition: WNL  Sleep:  Number of Hours: 6.75   Treatment Plan Summary: Daily contact with patient to assess and evaluate symptoms and progress in treatment  [name] is a 25 year old female with a history of schizoaffective disorder admitted for worsening of depression and suicide attempt by swallowing an AA battery, an earing and a screw.   1. Suicidal ideation. This has resolved. The patient is able to contract for safety. She is forward thinking and optimistic about the future.    2. Mood and psychosis. We continued Prolixin 5mg  po TID and Latuda 120mg  po daily for psychosis, Trileptal 150mg  po BID for mood stabilization, and Effexor XR 150mg  po daily for depression.   3. Hypertension. She is on amlodipine.  4. Constipation. She is on bowel regimen for constipation. No narcotics  were given for pain.  5. Hypothyroidism. She is on Synthroid.  6. Smoking. Nicotine patch was available.  7. Insomnia. She is on trazodone.  8. Social. She is an incompetent adult. Her Kara Dies is the guardian. She recently moved to [place].  9. Metabolic syndrome. Lipid panel, hemoglobin A1c, TSH, and PRL were checked during previous admission and were normal. Prolactin 15.   10. Foreign body ingestion. There are  three foreign objects present in the colon on X-ray with questionable anterograde progression showing on follow up study. She injested an AA battery on 3/16. She will continue bowel regimen and Lactuose. Input from GI and surgery is greatly appreciated. Spoke with Dr Tiffany Kocher who recommended surgery consult after patient was having severe abdominal pain last night. Per surgery, no intervention at this time except for an aggressive bowel regimen. Appreciate input from surgery. There will be no surgical intervention unless there is perforation/peritonitis. Another X-ray is planned. Apparently, such battery removal should be completed within 7 days of injestion, see GI note.  11. Disposition. To be established.    Jay Schlichter, MD 08/16/2015, 2:00 PM

## 2015-08-16 NOTE — [facility] Group Notes (Signed)
[facility] Group Notes:  (Nursing/MHT/Case Management/Adjunct)  Date:  08/16/2015  Time:  5:51 PM  Type of Therapy:  Psychoeducational Skills  Participation Level:  Did Not Attend  Adela Lank [facility] 08/16/2015, 5:51 PM

## 2015-08-16 NOTE — Progress Notes (Signed)
D: Pt c/o of abrupt, severe lower abdominal pain 10/10. Pt vital signs WNL A:  MD notified as pt had previously swallowed multiple foreign objects that are still present in pt gastrointestinal tract. Monitored pt. Sent pt to x-ray with MHT per MD order. R:  MD came in to assess pt. Pt was also seen by surgical consult. At approximately 2330 pt rated pain 6/10 stating "it's going down." Pt asked for enema and additional Miralax order to be given in morning as pt was sleeping, and did not want to wake up.

## 2015-08-16 NOTE — Plan of Care (Signed)
Problem: Alteration in thought process Goal: LTG-Patient has not harmed self or others in at least 2 days Outcome: Progressing Cooperative with meds and plan of care at this time.

## 2015-08-16 NOTE — B[facility] Group Notes (Signed)
[place] LCSW Group Therapy  08/16/2015 3:17 PM  Type of Therapy:  Group Therapy  Participation Level:  Did Not Attend  Modes of Intervention:  Discussion, Education, Socialization and Support  Summary of Progress/Problems: Self esteem: Patients discussed self esteem and how it impacts them. They discussed what aspects in their lives has influenced their self esteem. They were challenged to identify changes that are needed in order to improve self esteem.    [place] MSW, [place]  08/16/2015, 3:17 PM

## 2015-08-16 NOTE — Progress Notes (Signed)
D: Observed pt interacting in day room. Patient alert and oriented x4. Patient denies SI/HI/AVH. Pt affect is appropriate. Pt rated depression 1/10 and anxiety 4/10. Pt stated this is her baseline level anxiety. Pt indicated she slept till lunch because "I was just tired, but I went to all the rest of the groups. Pt socializing with peers. Pt only complaints were headache, and later abdominal pain (this was documented in previous note). No bowel movements this shift A: Offered active listening and support. Provided therapeutic communication. Administered scheduled medications. Orders were placed by surgical consult for enema and 255 g miralax. Pt was in deep sleep at this time (1205am) and when briefly woken, asked for these to be done in the morning.  R: Pt pleasant and cooperative. Pt medication compliant. Pt headache and abdominal pain decreased by time pt had started to settle down for rest. Will continue Q15 min. checks. Safety maintained.

## 2015-08-16 NOTE — Progress Notes (Addendum)
Patient with appropriate affect, cooperative behavior with meals, meds and plan of care. No SI/SH/HI at this time. Minimal interaction with peers at this time. Returns to bed after am meal. Has 1 liquid brown stool at this time, with no evidence of foreign bodies. Spoke with MD rt Lactulose order parameters and on call MD would like surgeon to verify order parameters to be changed to prn. Phoned surgeon, unable to leave message. Phoned supply chain and received enema bag at this time. Phoned dietary 3 times prior to availability and order placed for molasess and milk. Waiting to hear back from dietary rt request. No distress, no discomfort, patient sleeping at this time. MD aware no molasses available yet. Safety maintained.

## 2015-08-16 NOTE — [facility] Group Notes (Signed)
[place] Group Notes:  (Nursing/MHT/Case Management/Adjunct)  Date:  08/16/2015  Time:  3:00 AM  Type of Therapy:  Group Therapy  Participation Level:  Did Not Attend    Summary of Progress/Problems:  Melanie Bell 08/16/2015, 3:00 AM

## 2015-08-16 NOTE — Progress Notes (Signed)
No molasses available for enema. MD aware. Patient had 2 brown liquid bowel movements so far today, with no foreign objects passed. No distress, no complaint. Safety maintained.

## 2015-08-17 DIAGNOSIS — T189XXD Foreign body of alimentary tract, part unspecified, subsequent encounter: Secondary | ICD-10-CM

## 2015-08-17 NOTE — Progress Notes (Addendum)
[facility] MD Progress Note  08/17/2015 3:28 PM Melanie Bell  MRN:  [name]  Subjective:  [name] denies feeling depressed, anxious, or psychotic. She is not suicidal or homicidal. Over the weekend she had acute abdominal pain and surgery was called. He is greatly appreciated but the signs of feeling that the illness has to take "its course." This is in contrast with what GI consultant believes. The patient has been receiving laxatives and she does have multiple bowel movements. X-ray performed on the weekend does not indicate that the battery has been moving. Someone ordered milk and molasses enema. This was not completed as we did not have necessary ingredients in the hospital. The patient has been having bowel movements it is unclear if she still needs it. I will try to contact Dr. [name] to further discuss this case.  Principal Problem: Schizoaffective disorder, bipolar type ([place]) Diagnosis:   Patient Active Problem List   Diagnosis Date Noted  . Foreign body alimentary tract [T18.9XXA]   . Foreign body ingestion [T18.9XXA] 07/31/2015  . Schizoaffective disorder, bipolar type ([place]) [F25.0] 11/06/2014  . Tardive dyskinesia [G24.01] 10/06/2014  . Tobacco use disorder [F17.200] 09/30/2014  . Hypothyroidism [E03.9] 09/29/2014  . Hypertension [I10] 09/29/2014  . Constipation [K59.00] 09/29/2014   Total Time spent with patient: 20 minutes  Past Psychiatric History: Schizoaffective disorder.  Past Medical History:  Past Medical History  Diagnosis Date  . Hallucinations 09/30/2014    Sizoaffective  . Depression   . Anxiety   . Hypertension   . Tardive dyskinesia 10/2014    recent onset  . GERD (gastroesophageal reflux disease)   . Hyperlipidemia     Past Surgical History  Procedure Laterality Date  . Wisdom tooth extraction    . Esophagogastroduodenoscopy N/A 11/28/2014    Procedure: ESOPHAGOGASTRODUODENOSCOPY (EGD);  Surgeon: [name], MD;  Location[facility] ENDOSCOPY;   Service: Endoscopy;  Laterality: N/A;  . Abdominal surgery      "years ago" to remove foreign objects  . Breast lumpectomy     Family History:  Family History  Problem Relation Age of Onset  . Depression Mother   . Hypertension Mother    Family Psychiatric  History: See H&P. Social History:  History  Alcohol Use No     History  Drug Use No    Social History   Social History  . Marital Status: Single    Spouse Name: N/A  . Number of Children: N/A  . Years of Education: N/A   Social History Main Topics  . Smoking status: Current Every Day Smoker -- 0.50 packs/day for 3 years    Types: Cigarettes  . Smokeless tobacco: Never Used  . Alcohol Use: No  . Drug Use: No  . Sexual Activity: No   Other Topics Concern  . None   Social History Narrative   Additional Social History:    Pain Medications: See PTA Prescriptions: See PTA Over the Counter: See PTA History of alcohol / drug use?: No history of alcohol / drug abuse Longest period of sobriety (when/how long): No history of use                    Sleep: Fair  Appetite:  Good  Current Medications: Current Facility-Administered Medications  Medication Dose Route Frequency Provider Last Rate Last Dose  . acetaminophen (TYLENOL) tablet 650 mg  650 mg Oral Q6H PRN [name], MD   650 mg at 08/15/15 1952  . alum &  mag hydroxide-simeth (MAALOX/MYLANTA) 200-200-20 MG/5ML suspension 30 mL  30 mL Oral Q4H PRN [name], MD   30 mL at 08/13/15 2107  . amLODipine (NORVASC) tablet 2.5 mg  2.5 mg Oral Daily [name], MD   2.5 mg at 08/17/15 0943  . benztropine (COGENTIN) tablet 1 mg  1 mg Oral QHS [name], MD   1 mg at 08/16/15 2137  . docusate sodium (COLACE) capsule 200 mg  200 mg Oral BID [name], MD   200 mg at 08/17/15 0942  . fluPHENAZine (PROLIXIN) tablet 5 mg  5 mg Oral 3 times per day [name], MD   5 mg at 08/17/15 1405  . lactulose (CHRONULAC) 10 GM/15ML solution 30 g  30  g Oral TID [name], MD   30 g at 08/17/15 0942  . levothyroxine (SYNTHROID, LEVOTHROID) tablet 75 mcg  75 mcg Oral QAC breakfast [name], MD   75 mcg at 08/17/15 [phone]  . lurasidone (LATUDA) tablet 120 mg  120 mg Oral Q breakfast [name], MD   120 mg at 08/17/15 0937  . magnesium hydroxide (MILK OF MAGNESIA) suspension 30 mL  30 mL Oral Daily PRN [name], MD   30 mL at 08/09/15 2143  . milk and molasses enema  1 enema Rectal Once [name], MD   250 mL at 08/16/15 1230  . nicotine (NICODERM CQ - dosed in mg/24 hours) patch 21 mg  21 mg Transdermal Daily Shavone Nevers B Srihari Shellhammer, MD   21 mg at 08/17/15 0944  . Oxcarbazepine (TRILEPTAL) tablet 150 mg  150 mg Oral BID [name], MD   150 mg at 08/17/15 0943  . polyethylene glycol (MIRALAX / GLYCOLAX) packet 17 g  17 g Oral BID [name], MD   17 g at 08/17/15 0940  . senna (SENOKOT) tablet 17.2 mg  2 tablet Oral QHS [name], MD   17.2 mg at 08/16/15 2139  . traZODone (DESYREL) tablet 100 mg  100 mg Oral QHS [name], MD   100 mg at 08/16/15 2139  . venlafaxine XR (EFFEXOR-XR) 24 hr capsule 150 mg  150 mg Oral Q breakfast [name], MD   150 mg at 08/17/15 [id number]    Lab Results: No results found for this or any previous visit (from the past 48 hour(s)).  Blood Alcohol level:  Lab Results  Component Value Date   ETH <5 07/30/2015   ETH <5 07/13/2015    Physical Findings: AIMS: Facial and Oral Movements Muscles of Facial Expression: None, normal Lips and Perioral Area: None, normal Jaw: None, normal Tongue: None, normal,Extremity Movements Upper (arms, wrists, hands, fingers): None, normal Lower (legs, knees, ankles, toes): None, normal, Trunk Movements Neck, shoulders, hips: None, normal, Overall Severity Severity of abnormal movements (highest score from questions above): None, normal Incapacitation due to abnormal movements: None, normal Patient's awareness of abnormal  movements (rate only patient's report): Aware, no distress, Dental Status Current problems with teeth and/or dentures?: No Does patient usually wear dentures?: No  CIWA:    COWS:     Musculoskeletal: Strength & Muscle Tone: within normal limits Gait & Station: unsteady Patient leans: N/A  Psychiatric Specialty Exam: Review of Systems  Gastrointestinal: Positive for diarrhea.  All other systems reviewed and are negative.   Blood pressure 119/68, pulse 72, temperature 98.3 F (36.8 C), temperature source Oral, resp. rate 20, height 5\' 6"  (  1.676 m), weight 86.183 kg (190 lb), SpO2 100 %.Body mass index is 30.68 kg/(m^2).  General Appearance: Casual  Eye Contact::  Good  Speech:  Clear and Coherent  Volume:  Normal  Mood:  Euthymic  Affect:  Appropriate  Thought Process:  Goal Directed  Orientation:  Full (Time, Place, and Person)  Thought Content:  WDL  Suicidal Thoughts:  No  Homicidal Thoughts:  No  Memory:  Immediate;   Fair Recent;   Fair Remote;   Fair  Judgement:  Poor  Insight:  Lacking  Psychomotor Activity:  Normal  Concentration:  Fair  Recall:  [employer] of Knowledge:Fair  Language: Fair  Akathisia:  No  Handed:  Right  AIMS (if indicated):     Assets:  Communication Skills Desire for Improvement Financial Resources/Insurance Housing Resilience Social Support  ADL's:  Intact  Cognition: WNL  Sleep:  Number of Hours: 7.75   Treatment Plan Summary: Daily contact with patient to assess and evaluate symptoms and progress in treatment and Medication management   [name] is a 25 year old female with a history of schizoaffective disorder admitted for worsening of depression and suicide attempt by swallowing an AA battery, an earing and a screw.   1. Suicidal ideation. This has resolved. The patient is able to contract for safety. She is forward thinking and optimistic about the future.    2. Mood and psychosis. We continued Prolixin 5mg  po TID and  Latuda 120mg  po daily for psychosis, Trileptal 150mg  po BID for mood stabilization, and Effexor XR 150mg  po daily for depression.   3. Hypertension. She is on amlodipine 2.5mg  po daily.  4. Constipation. She is on bowel regimen for constipation including Miralax and bowels are moving.  5. Hypothyroidism. She is on Synthroid 83mcg po daily  6. Smoking. Nicotine patch was available.  7. Insomnia. She  responded well to trazodone.  8. Social. She is an incompetent adult. Her Kara Dies is the guardian. She recently moved to [place].  9. Metabolic syndrome. Lipid panel, hemoglobin A1c, TSH, and PRL were checked during previous admission and were normal. Prolactin 15.   10. Foreign body ingestion. There are three foreign objects present in the colon on X-ray with questionable anterograde progression showing on follow up study. She injested an AA battery on 3/16. She will continue bowel regimen and Lactuose. Input from GI and surgery is greatly appreciated.  Surgeries signed off. I will discontinue to discuss her case with Dr. [name], GI. There will be no surgical intervention unless there is perforation/peritonitis. Another X-ray  was done over the weekend.-60 minutes With she goes home she is not thinking Apparently, such battery removal should be completed within 7 days of injestion, see GI note.  11. Disposition. To be established.   I certify that the services received since the previous certification/recertification were and continue to be medically necessary as the treatment provided can be reasonably expected to improve the patient's condition; the medical record documents that the services furnished were intensive treatment services or their equivalent services, and this patient continues to need, on a daily basis, active treatment furnished directly by or requiring the supervision of inpatient psychiatric personnel.    Orson Slick, MD 08/17/2015, 3:28 PM

## 2015-08-17 NOTE — Progress Notes (Signed)
D: Patient appears sad on the unit. She denies SI/HI/AVH. She denies pain. She has had one liquid BM tonight that has been gone through this evening. Nothing noted in stool. Patient just states she's having some flatulence.  A: Medication was given with education. Encouragement was provided.  R: Patient was compliant with medication. She has remained calm and cooperative. Safety maintained with 15 min checks.

## 2015-08-17 NOTE — Progress Notes (Signed)
Dietary states that they do not have Molasses.

## 2015-08-17 NOTE — Plan of Care (Signed)
Problem: Alteration in thought process Goal: LTG-Patient has not harmed self or others in at least 2 days Outcome: Progressing Patient has not harm self and continues to deny SI. Contracts for safety.

## 2015-08-17 NOTE — Progress Notes (Signed)
Pt ingested multiple foreign bodies. Significant psych hx. She is tolerating PO, having BM, does not complaint of any pain  PE : NAD awake alert Abd: soft , NT, no peritonitis  A/P foreign bodies colon , no surgical intervention  We will sign off

## 2015-08-17 NOTE — Progress Notes (Signed)
Recreation Therapy Notes  Date: 04.03.17 Time: 1:15 pm Location: [facility]  Group Topic: Wellness  Goal Area(s) Addresses:  Patient will identify at least one item per dimension of health. Patient will examine areas they are deficient in.  Behavioral Response: Did not attend  Intervention: 6 Dimensions of Health  Activity: Patients were given a definition sheet with the 6 dimensions on wellness on it. Patients were given a worksheet with the different dimensions on it and were instructed to list at least one item in each category that they are currently doing.  Education: LRT educated patients on how they can increase each dimension.  Education Outcome: Patient did not attend group.  Clinical Observations/Feedback: Patient did not attend group.   Leonette Monarch, LRT/CTRS 08/17/2015 4:38 PM

## 2015-08-17 NOTE — Plan of Care (Signed)
Problem: Diagnosis: Increased Risk For Suicide Attempt Goal: LTG-Patient Will Show Positive Response to Medication LTG (by discharge) : Patient will show positive response to medication and will participate in the development of the discharge plan.  Outcome: Progressing Xrays show objects moving down the colon

## 2015-08-17 NOTE — Progress Notes (Addendum)
Patient slept through breakfast. She attended groups and remained cooperative with routine. Still on the bowel protocol. Patient has had two loose BM's thus far. BM's assessed with no foreign object noted. There was an order from 4/2 for a milk of molasses enema. Called pharmacy and they stated that they did not carry this kind of enema, they said that the enema may be prepared by dietary. MD Dr. Mamie Nick notified. Will continue to assess each BM. Denies SI, AH/VH and contracts for safety.

## 2015-08-18 ENCOUNTER — Inpatient Hospital Stay: Payer: MEDICAID

## 2015-08-18 IMAGING — CR DG ABDOMEN 2V
2 series · 2 of 2 positions shown · non-contrast
Comparison: [DATE]

CLINICAL DATA: Followup ingestion of multiple foreign bodies.

EXAM:
ABDOMEN - 2 VIEW

[abdomen erect]
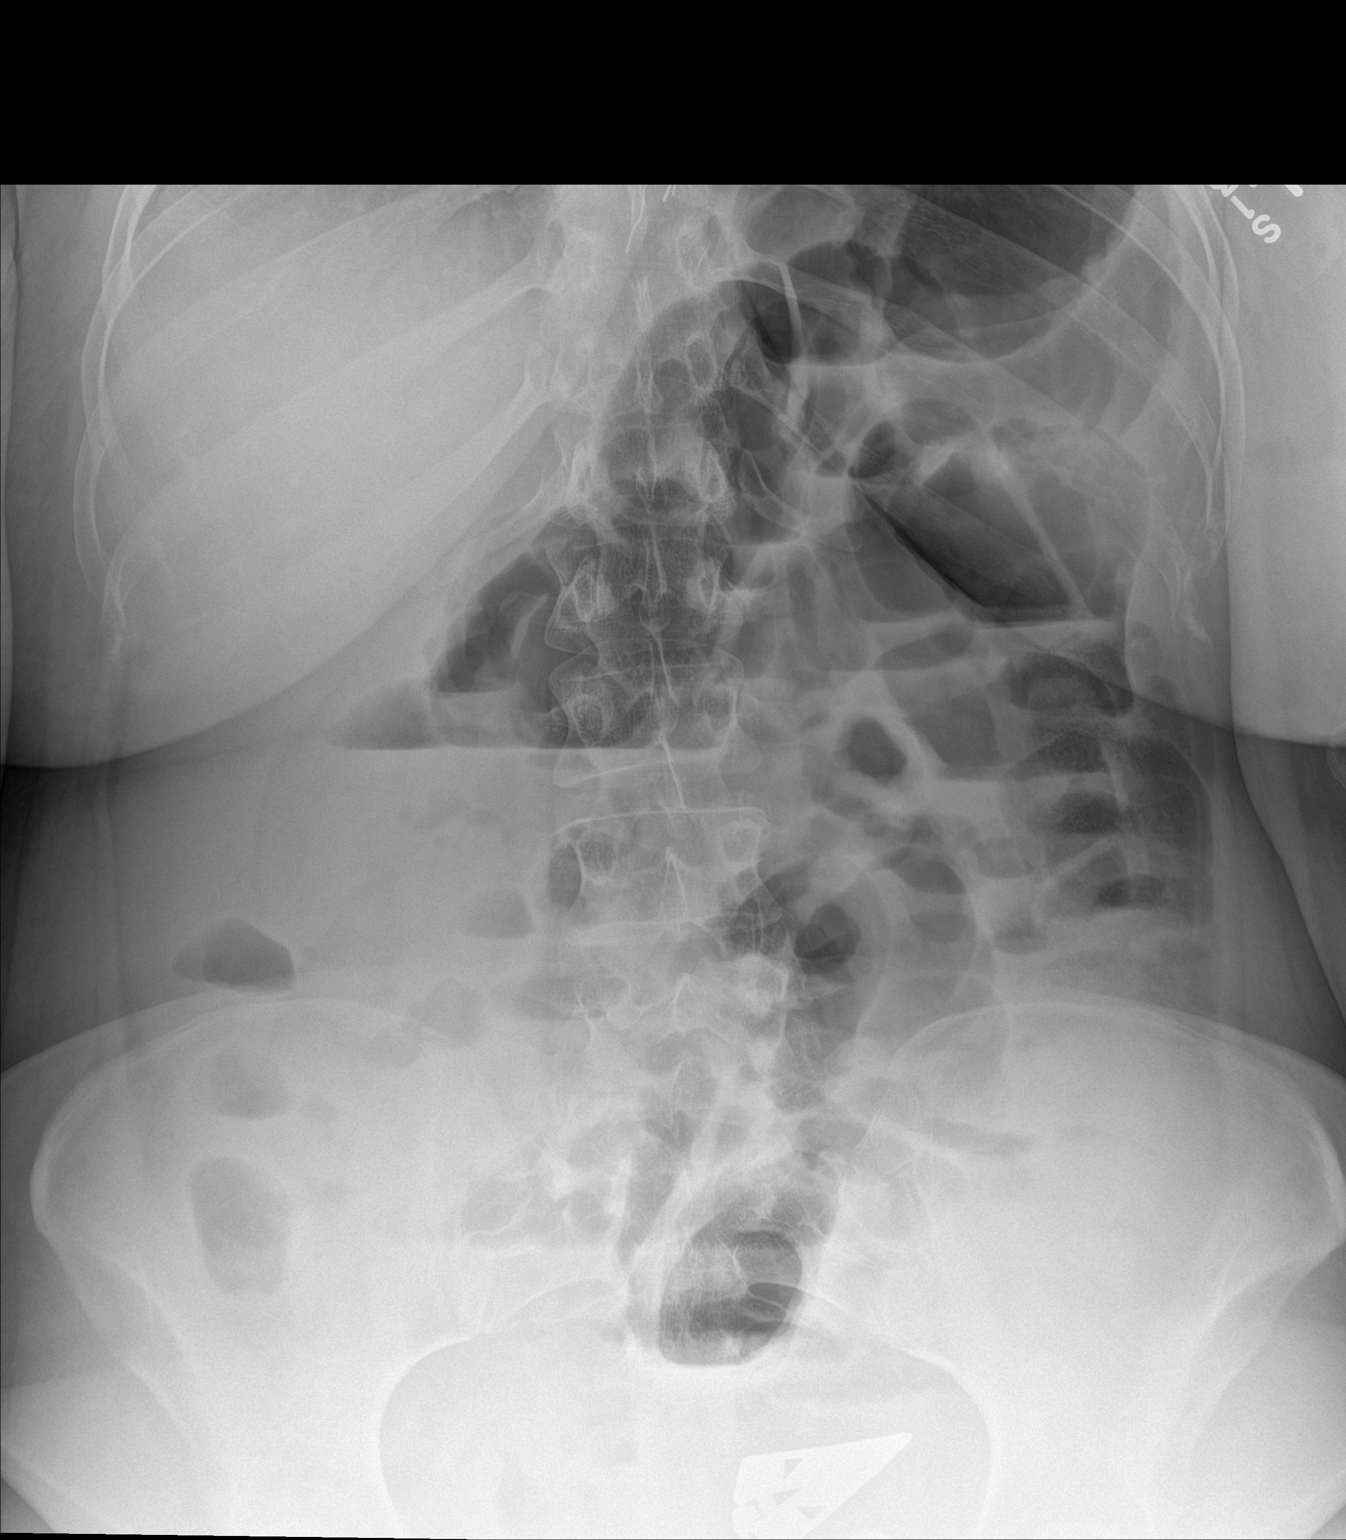

[abdomen supine]
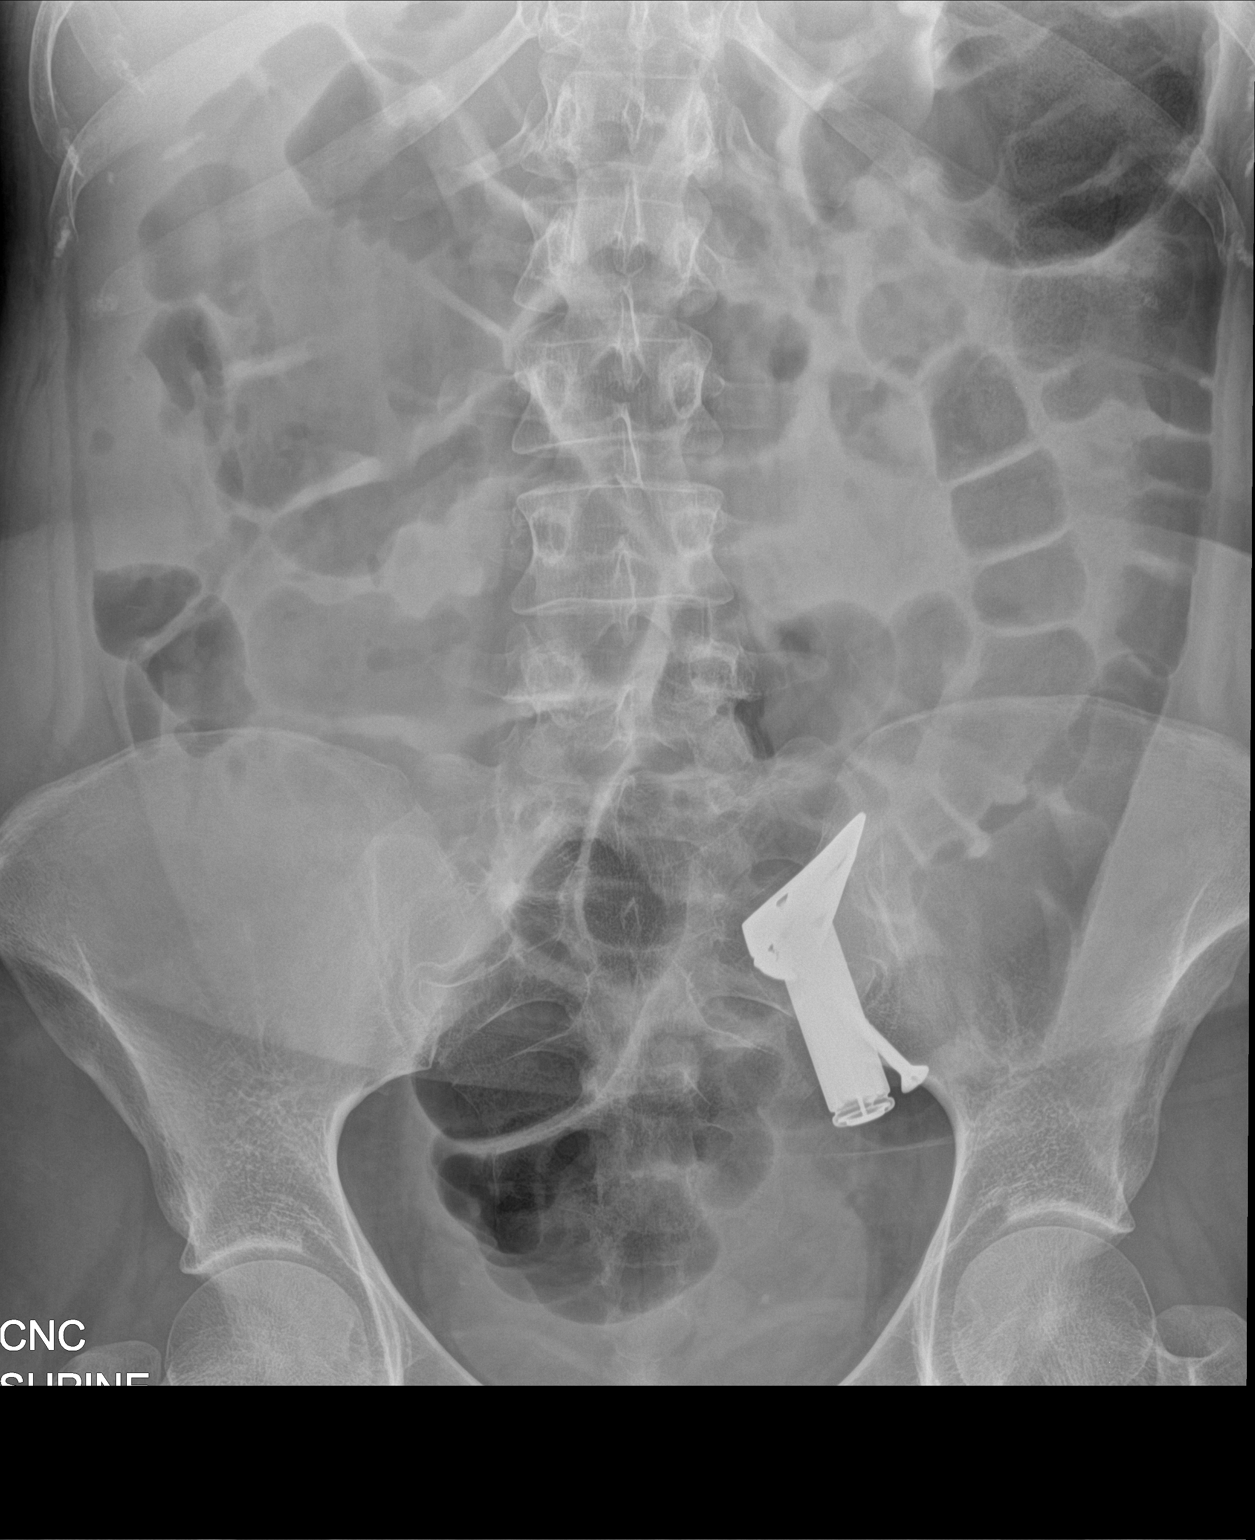

[2 of 2 positions shown; findings below may reference images not displayed]

FINDINGS: Previously ingested foreign bodies (screw, suspected earring, and
probable battery) are clustered in the left lower quadrant and given
the time line likely are in the sigmoid colon rather than small
bowel. Unchanged positioning of 3 wirelike foreign bodies
overlapping the distal esophagus. Prominent colonic gas and stool
which is stable. No evolution to suggest bowel obstruction. Negative
for pneumoperitoneum.
IMPRESSION: 1. Three intra-abdominal foreign bodies have clustered in the left
lower quadrant, favor sigmoid location over distal small bowel.
2. Unchanged positioning of wirelike foreign bodies overlapping the
lower esophagus.
3. No change to suggest obstruction.  No pneumoperitoneum.

## 2015-08-18 NOTE — Progress Notes (Signed)
Patient had a second bowel movement and no foreign objects were found.

## 2015-08-18 NOTE — Progress Notes (Signed)
Recreation Therapy Notes  Date: 04.04.17 Time: 3:10 pm Location: Craft Room  Group Topic: Goal Setting  Goal Area(s) Addresses:  Patient will be able to identify one supportive statement per peer in group. Patient will verbalize benefit of setting goals. Patient will verbalize benefit of supporting peers. Patient will identify benefit of feeling supported.  Behavioral Response: Attentive, Interactive  Intervention: Step By Step  Activity: Patients were given a foot worksheet and instructed to write a goal towards their recovery inside the foot. Patients then passed their papers around and wrote supportive statements on their peer's papers.  Education: LRT educated patients on healthy ways to celebrate achieving their goals.  Education Outcome: Acknowledges education/In group clarification offered   Clinical Observations/Feedback: Patient completed activity by writing a personal goal and writing positive statements on peer's papers. Patient contributed to group discussion by stating how it felt to get positive reinforcement from peers, and that she can use the positive reinforcement to help her reach her goal.  Leonette Monarch, LRT/CTRS 08/18/2015 4:36 PM

## 2015-08-18 NOTE — Plan of Care (Signed)
Problem: Diagnosis: Increased Risk For Suicide Attempt Goal: STG-Patient Will Comply With Medication Regime Outcome: Progressing Patient is compliant with all ordered medications

## 2015-08-18 NOTE — Progress Notes (Signed)
[facility] MD Progress Note  08/18/2015 2:23 PM Melanie Bell  MRN:  [id number]  Subjective:  Melanie Bell denies any symptoms of depression, anxiety, or psychosis. She is not homicidal or suicidal. She complains of feeling tired as she had multiple bowel movements through the night. She is on multiple laxatives. She is on clear liquid diet. She does not complain of abdominal pain. Her abdomen is soft and nontender. I spoke with Dr. [name] today again. He ordered an abdominal x-ray to see if there is progression of foreign objects in the gut. Melanie Bell participated in treatment team meeting today. She is concerned about her situation. She has been compliant with treatment. She is trying to avoid another abdominal surgery if possible. She is concerned that his discharge to a group home she will not have adequate supervision there.     Principal Problem: Schizoaffective disorder, bipolar type ([place]) Diagnosis:   Patient Active Problem List   Diagnosis Date Noted  . Foreign body alimentary tract [T18.9XXA]   . Foreign body ingestion [T18.9XXA] 07/31/2015  . Schizoaffective disorder, bipolar type ([place]) [F25.0] 11/06/2014  . Tardive dyskinesia [G24.01] 10/06/2014  . Tobacco use disorder [F17.200] 09/30/2014  . Hypothyroidism [E03.9] 09/29/2014  . Hypertension [I10] 09/29/2014  . Constipation [K59.00] 09/29/2014   Total Time spent with patient: 20 minutes  Past Psychiatric History: Schizoaffective disorder.  Past Medical History:  Past Medical History  Diagnosis Date  . Hallucinations 09/30/2014    Sizoaffective  . Depression   . Anxiety   . Hypertension   . Tardive dyskinesia 10/2014    recent onset  . GERD (gastroesophageal reflux disease)   . Hyperlipidemia     Past Surgical History  Procedure Laterality Date  . Wisdom tooth extraction    . Esophagogastroduodenoscopy N/A 11/28/2014    Procedure: ESOPHAGOGASTRODUODENOSCOPY (EGD);  Surgeon: [name], MD;  Location: [facility]  ENDOSCOPY;  Service: Endoscopy;  Laterality: N/A;  . Abdominal surgery      "years ago" to remove foreign objects  . Breast lumpectomy     Family History:  Family History  Problem Relation Age of Onset  . Depression Mother   . Hypertension Mother    Family Psychiatric  History: Mother with depression. Social History:  History  Alcohol Use No     History  Drug Use No    Social History   Social History  . Marital Status: Single    Spouse Name: N/A  . Number of Children: N/A  . Years of Education: N/A   Social History Main Topics  . Smoking status: Current Every Day Smoker -- 0.50 packs/day for 3 years    Types: Cigarettes  . Smokeless tobacco: Never Used  . Alcohol Use: No  . Drug Use: No  . Sexual Activity: No   Other Topics Concern  . None   Social History Narrative   Additional Social History:    Pain Medications: See PTA Prescriptions: See PTA Over the Counter: See PTA History of alcohol / drug use?: No history of alcohol / drug abuse Longest period of sobriety (when/how long): No history of use                    Sleep: Fair  Appetite:  Fair  Current Medications: Current Facility-Administered Medications  Medication Dose Route Frequency Provider Last Rate Last Dose  . acetaminophen (TYLENOL) tablet 650 mg  650 mg Oral Q6H PRN [name], MD   650 mg at 08/15/15 1952  .  alum & mag hydroxide-simeth (MAALOX/MYLANTA) 200-200-20 MG/5ML suspension 30 mL  30 mL Oral Q4H PRN [name], MD   30 mL at 08/13/15 2107  . amLODipine (NORVASC) tablet 2.5 mg  2.5 mg Oral Daily [name], MD   2.5 mg at 08/18/15 0925  . benztropine (COGENTIN) tablet 1 mg  1 mg Oral QHS [name], MD   1 mg at 08/17/15 2155  . docusate sodium (COLACE) capsule 200 mg  200 mg Oral BID [name], MD   200 mg at 08/18/15 [id number]  . fluPHENAZine (PROLIXIN) tablet 5 mg  5 mg Oral 3 times per day [name], MD   5 mg at 08/18/15 0646  . lactulose (CHRONULAC)  10 GM/15ML solution 30 g  30 g Oral TID [name], MD   30 g at 08/18/15 0932  . levothyroxine (SYNTHROID, LEVOTHROID) tablet 75 mcg  75 mcg Oral QAC breakfast [name], MD   75 mcg at 08/18/15 0646  . lurasidone (LATUDA) tablet 120 mg  120 mg Oral Q breakfast [name], MD   120 mg at 08/18/15 [id number]  . magnesium hydroxide (MILK OF MAGNESIA) suspension 30 mL  30 mL Oral Daily PRN [name], MD   30 mL at 08/09/15 2143  . milk and molasses enema  1 enema Rectal Once [name], MD   250 mL at 08/16/15 1230  . nicotine (NICODERM CQ - dosed in mg/24 hours) patch 21 mg  21 mg Transdermal Daily [name] [name], MD   21 mg at 08/18/15 0926  . Oxcarbazepine (TRILEPTAL) tablet 150 mg  150 mg Oral BID [name], MD   150 mg at 08/18/15 0926  . polyethylene glycol (MIRALAX / GLYCOLAX) packet 17 g  17 g Oral BID [name], MD   17 g at 08/18/15 [id number]  . senna (SENOKOT) tablet 17.2 mg  2 tablet Oral QHS [name], MD   17.2 mg at 08/17/15 2155  . traZODone (DESYREL) tablet 100 mg  100 mg Oral QHS [name], MD   100 mg at 08/17/15 2155  . venlafaxine XR (EFFEXOR-XR) 24 hr capsule 150 mg  150 mg Oral Q breakfast [name], MD   150 mg at 08/18/15 [id number]    Lab Results: No results found for this or any previous visit (from the past 48 hour(s)).  Blood Alcohol level:  Lab Results  Component Value Date   ETH <5 07/30/2015   ETH <5 07/13/2015    Physical Findings: AIMS: Facial and Oral Movements Muscles of Facial Expression: None, normal Lips and Perioral Area: None, normal Jaw: None, normal Tongue: None, normal,Extremity Movements Upper (arms, wrists, hands, fingers): None, normal Lower (legs, knees, ankles, toes): None, normal, Trunk Movements Neck, shoulders, hips: None, normal, Overall Severity Severity of abnormal movements (highest score from questions above): None, normal Incapacitation due to abnormal movements: None,  normal Patient's awareness of abnormal movements (rate only patient's report): Aware, no distress, Dental Status Current problems with teeth and/or dentures?: No Does patient usually wear dentures?: No  CIWA:    COWS:     Musculoskeletal: Strength & Muscle Tone: within normal limits Gait & Station: normal Patient leans: N/A  Psychiatric Specialty Exam: Review of Systems  Gastrointestinal: Positive for diarrhea.  All other systems reviewed and are negative.   Blood pressure 112/72, pulse 81, temperature 98.2 F (36.8 C), temperature source Oral, resp. rate 20, height  5\' 6"  (1.676 m), weight 86.183 kg (190 lb), SpO2 100 %.Body mass index is 30.68 kg/(m^2).  General Appearance: Casual  Eye Contact::  Good  Speech:  Clear and Coherent  Volume:  Normal  Mood:  Anxious  Affect:  Blunt  Thought Process:  Goal Directed  Orientation:  Full (Time, Place, and Person)  Thought Content:  WDL  Suicidal Thoughts:  No  Homicidal Thoughts:  No  Memory:  Immediate;   Fair Recent;   Fair Remote;   Fair  Judgement:  Poor  Insight:  Shallow  Psychomotor Activity:  Normal  Concentration:  Fair  Recall:  [place]: Fair  Akathisia:  No  Handed:  Right  AIMS (if indicated):     Assets:  Communication Skills Desire for Improvement Financial Resources/Insurance Housing Resilience Social Support  ADL's:  Intact  Cognition: WNL  Sleep:  Number of Hours: 7   Treatment Plan Summary: Daily contact with patient to assess and evaluate symptoms and progress in treatment and Medication management   Melanie Bell is a 25 year old female with a history of schizoaffective disorder admitted for worsening of depression and suicide attempt by swallowing an AA battery, an earing and a screw.   1. Suicidal ideation. This has resolved. The patient is able to contract for safety. She is forward thinking and optimistic about the future.    2. Mood and psychosis. We  continued Prolixin 5mg  po TID and Latuda 120mg  po daily for psychosis, Trileptal 150mg  po BID for mood stabilization, and Effexor XR 150mg  po daily for depression.   3. Hypertension. She is on amlodipine 2.5mg  po daily.  4. Constipation. She is on bowel regimen for constipation including Colace and Miralax and bowels are moving.  5. Hypothyroidism. She is on Synthroid 88mcg po daily  6. Smoking. Nicotine patch was available.  7. Insomnia. She responded well to trazodone.  8. Social. She is an incompetent adult. Her Kara Dies is the guardian. She recently moved to [place].  9. Metabolic syndrome. Lipid panel, hemoglobin A1c, TSH, and PRL were checked during previous admission and were normal. Prolactin 15.   10. Foreign body ingestion. There are three foreign objects present in the colon on X-ray with questionable anterograde progression showing on follow up study. She injested an AA battery on 3/15. She will continue bowel regimen and Lactuose. Input from GI and surgery is greatly appreciated. Surgery signed off. I will discontinue to discuss her case with Dr. [name], GI. There will be no surgical intervention unless there is perforation/peritonitis. Another X-ray was done today. Apparently, such battery removal should be completed within 7 days of injestion, see GI note.  11. Disposition. To be established. She will eventually return to her group home and follow up with her regular psychiatrist at [place].   Orson Slick, MD 08/18/2015, 2:23 PM

## 2015-08-18 NOTE — Plan of Care (Signed)
Problem: Alteration in thought process Goal: LTG-Patient verbalizes understanding importance med regimen (Patient verbalizes understanding of importance of medication regimen and need to continue outpatient care.)  Outcome: Progressing Pt verbalizes an understanding of all medications and what they are prescribed for.  Problem: Diagnosis: Increased Risk For Suicide Attempt Goal: STG-Patient Will Report Suicidal Feelings to Staff Outcome: Progressing Pt rates depression as a 0 out of 10. Denies SI at this time.

## 2015-08-18 NOTE — [facility] Group Notes (Signed)
[place] LCSW Group Therapy  08/18/2015 5:14 PM  Type of Therapy:  Group Therapy  Participation Level:  Did Not Attend, probably due to upset stomach due to current bowl regimen.   [name], MSW, LCSW 08/18/2015, 5:14 PM

## 2015-08-18 NOTE — Progress Notes (Signed)
D:  Patient is alert and oriented on the unit this shift.  Patient attended and actively participated in afternoon groups today.  Patient was very sleepy in the am.  Patient denies suicidal ideation, homicidal ideation, auditory or visual hallucinations at the present time.  Patient had a bowel movement and no foreign objects were found. A:  Scheduled medications are administered to patient as per MD orders.  Emotional support and encouragement are provided.  Patient is maintained on q.15 minute safety checks.  Patient is informed to notify staff with questions or concerns. R:  No adverse medication reactions are noted.  Patient is cooperative with medication administration and treatment plan today.  Patient is receptive, calm and cooperative on the unit at this time.  Patient interacts well with others on the unit this shift.  Patient contracts for safety at this time.  Patient remains safe at this time.

## 2015-08-18 NOTE — [facility] Group Notes (Signed)
[place] LCSW Group Therapy  08/17/2015 5:15 PM  Type of Therapy:  Group Therapy  Participation Level:  Did Not Attend, probably due to upset stomach from current bowl regimen   Gayle Collard, Carloyn Jaeger, MSW, LCSW 08/18/2015, 5:15 PM

## 2015-08-18 NOTE — Progress Notes (Signed)
D: Pt is seen in the milieu interacting appropriately with staff and peers this evening. She denies SI/HI/AVH at this time. Denies pain. Pt states that her goal for today was "to go to all the groups." She reports that she did not attend every group today but did make an effort to. Pt rates anxiety as a 3 out of 10 due to "waiting to pass these objects." Pt has not reported a bowel movement this shift. A: Emotional support provided. Pt encouraged to inform staff when she does have a bowel movement. Medications administered upon request. q15 minute safety checks maintained. R: Pt remains free from harm. Will continue to monitor.

## 2015-08-18 NOTE — Plan of Care (Signed)
Problem: Diagnosis: Increased Risk For Suicide Attempt Goal: STG-Patient Will Attend All Groups On The Unit Outcome: Progressing Patient attends all groups with the exception of the first early morning group.

## 2015-08-18 NOTE — Consult Note (Signed)
Subjective: Patient seen foriegn body ingestion. Patient denies any nausea or abdominal pain. Her lower abdominal pain is much better after having several bowel movements. Her last bowel movement was this morning.  Objective: Vital signs in last 24 hours: Temp:  [98.2 F (36.8 C)] 98.2 F (36.8 C) 08-28-2022 0700) Pulse Rate:  [81] 81 2022-08-28 0700) Resp:  [20] 20 2022-08-28 0700) BP: (112)/(72) 112/72 mmHg 08-28-2022 0700) Blood pressure 112/72, pulse 81, temperature 98.2 F (36.8 C), temperature source Oral, resp. rate 20, height 5\' 6"  (1.676 m), weight 86.183 kg (190 lb), SpO2 100 %.   Intake/Output from previous day: 04/03 0701 - 2022/08/28 0700 In: 840 [P.O.:840] Out: -   Intake/Output this shift:     General appearance:  Well-appearing 25 year old female no distress Resp:  Clear to auscultation Cardio:  Regular rate and rhythm GI:  Soft nontender nondistended bowel sounds positive normoactive Extremities:  No clubbing cyanosis or edema   Lab Results: No results found for this or any previous visit (from the past 24 hour(s)).   No results for input(s): WBC, HGB, HCT, PLT in the last 72 hours. BMET No results for input(s): NA, K, CL, CO2, GLUCOSE, BUN, CREATININE, CALCIUM in the last 72 hours. LFT No results for input(s): PROT, ALBUMIN, AST, ALT, ALKPHOS, BILITOT, BILIDIR, IBILI in the last 72 hours. PT/INR No results for input(s): LABPROT, INR in the last 72 hours. Hepatitis Panel No results for input(s): HEPBSAG, HCVAB, HEPAIGM, HEPBIGM in the last 72 hours. C-Diff No results for input(s): CDIFFTOX in the last 72 hours. No results for input(s): CDIFFPCR in the last 72 hours.   Studies/Results: Dg Abd 2 Views  2015/08/28  CLINICAL DATA:  Followup ingestion of multiple foreign bodies. EXAM: ABDOMEN - 2 VIEW COMPARISON:  08/15/2015 FINDINGS: Previously ingested foreign bodies (screw, suspected earring, and probable battery) are clustered in the left lower quadrant and given the time  line likely are in the sigmoid colon rather than small bowel. Unchanged positioning of 3 wirelike foreign bodies overlapping the distal esophagus. Prominent colonic gas and stool which is stable. No evolution to suggest bowel obstruction. Negative for pneumoperitoneum. IMPRESSION: 1. Three intra-abdominal foreign bodies have clustered in the left lower quadrant, favor sigmoid location over distal small bowel. 2. Unchanged positioning of wirelike foreign bodies overlapping the lower esophagus. 3. No change to suggest obstruction.  No pneumoperitoneum. Electronically Signed   By: Monte Fantasia M.D.   On: 2015/08/28 14:26    Scheduled Inpatient Medications:   . amLODipine  2.5 mg Oral Daily  . benztropine  1 mg Oral QHS  . docusate sodium  200 mg Oral BID  . fluPHENAZine  5 mg Oral 3 times per day  . lactulose  30 g Oral TID  . levothyroxine  75 mcg Oral QAC breakfast  . lurasidone  120 mg Oral Q breakfast  . milk and molasses  1 enema Rectal Once  . nicotine  21 mg Transdermal Daily  . Oxcarbazepine  150 mg Oral BID  . polyethylene glycol  17 g Oral BID  . senna  2 tablet Oral QHS  . traZODone  100 mg Oral QHS  . venlafaxine XR  150 mg Oral Q breakfast    Continuous Inpatient Infusions:     PRN Inpatient Medications:  acetaminophen, alum & mag hydroxide-simeth, magnesium hydroxide  Miscellaneous:   Assessment:  1. Multiple foreign body ingestion. Films taken today after her bowel movement indicate that these are now in the mid to distal sigmoid  region. Hopefully these will pass within the next day or so. Would continue full liquid diet for now and advance to  soft diet thereafter. If all 3 are not verified to pass and a bowel movement in the next day or two would need to repeat a abdominal film.  Plan:  As noted above  [name] MD 08/18/2015, 9:07 PM

## 2015-08-19 NOTE — Progress Notes (Signed)
D: Patient voice of her goal today  Go To Groups and continue to work on  Coping  Skills  Patient stated slept good last night .Stated appetite is good and energy level  Is normal. Stated concentration is good . Stated on Depression scale ,1 hopeless 0  and anxiety 2 .( low 0-10 high) Denies suicidal  homicidal ideations  .  No auditory hallucinations  No pain concerns . Appropriate ADL'S. Interacting with peers and staff.  A: Encourage patient participation with unit programming . Instruction  Given on  Medication , verbalize understanding. R: Voice no other concerns. Staff continue to monitor

## 2015-08-19 NOTE — Tx Team (Signed)
Interdisciplinary Treatment Plan Update (Adult)  Date:  08/19/2015 Time Reviewed:  4:07 PM  Progress in Treatment: Attending groups: Yes. Participating in groups:  Yes. Taking medication as prescribed:  Yes. Tolerating medication:  Yes. Family/Significant othe contact made:  Yes, individual(s) contacted:  mother and group home Patient understands diagnosis:  Yes. Discussing patient identified problems/goals with staff:  Yes. Medical problems stabilized or resolved:  No. and As evidenced by:  Pt has not passed the battery yet.  Denies suicidal/homicidal ideation: Yes. Issues/concerns per patient self-inventory:  Yes. Other:  New problem(s) identified: No, Describe:  NA  Discharge Plan or Barriers: Pt plans to return home and follow up with outpatient.    Reason for Continuation of Hospitalization: Hallucinations Medication stabilization Other; describe Pt swallowed objects  Comments: Melanie Bell is a 25 year old female with a history of schizoaffective disorder. She was briefly admitted to medical floor after she swallowed 3 foreign objects including a screw, AA battery and an earring. She was transferred to psychiatry before she passed them. The battery has been there since March 15. Apparently about that he should not stick around the gap for longer than a week according to GI note. She is now being followed by Dr. [name], GI. Surgery refuses to operate until there is perforation and/or peritonitis. The patient is no longer in need of psychiatric care but I could not discharge her to home given her medical condition. She is on liquid diet and laxatives.  Melanie Bell denies any symptoms of depression, anxiety, or psychosis. She denies any abdominal pain. In spite over multiple laxatives and lactulose she has not had a bowel movement yesterday. We will give mag citrate again today as her x-ray indicates massive amounts of stool in the bowels. She accepts medications and tolerates them  well. She participates well in programming.   Estimated length of stay: 7 days   New goal(s): NA  Review of initial/current patient goals per problem list:   1.  Goal(s): Patient will participate in aftercare plan * Met: Yes * Target date: at discharge * As evidenced by: Patient will participate within aftercare plan AEB aftercare provider and housing plan at discharge being identified. 08/18/15: Patient has a provider and will be referred to [facility] and has a group home which she can return to at discharge. Goal met.    2.  Goal (s): Patient will exhibit decreased depressive symptoms and suicidal ideations. * Met: Yes *  Target date: at discharge * As evidenced by: Patient will utilize self rating of depression at 3 or below and demonstrate decreased signs of depression or be deemed stable for discharge by MD. 08/18/15: Patient reports no SI and depression of 0. Goal met.   3.  Goal (s): Patient will demonstrate decreased symptoms of psychosis. * Met: No  *  Target date: at discharge * As evidenced by: Patient will not endorse signs of psychosis or be deemed stable for discharge by MD.  08/18/15: Patient reports no psychosis but is awaiting a batter to pass through her intestines before discharge.   Attendees: Physician:   Orson Slick, MD 4/5/20174:07 PM  Nursing:   Nicanor Bake, RN  4/5/20174:07 PM  Other:  Fransisca Connors 4/5/20174:07 PM  Other:  Everitt Amber, LRT  4/5/20174:07 PM  Other:  Garald Braver, Psych D 4/5/20174:07 PM  Other:  4/5/20174:07 PM  Other:  4/5/20174:07 PM  Other:  4/5/20174:07 PM  Other:  4/5/20174:07 PM  Other:  4/5/20174:07 PM  Other:  4/5/20174:07 PM  Other:   4/5/20174:07 PM   Scribe for Treatment Team:   Keene Breath, MSW, LCSW  08/19/2015, 4:07 PM

## 2015-08-19 NOTE — [facility] Group Notes (Signed)
[facility] Group Notes:  (Nursing/MHT/Case Management/Adjunct)  Date:  08/19/2015  Time:  4:04 PM  Type of Therapy:  Psychoeducational Skills  Participation Level:  Active  Participation Quality:  Appropriate  Affect:  Appropriate  Cognitive:  Appropriate  Insight:  Appropriate  Engagement in Group:  Engaged  Modes of Intervention:  Discussion and Education  Summary of Progress/Problems:  Drake Leach 08/19/2015, 4:04 PM

## 2015-08-19 NOTE — [facility] Group Notes (Signed)
[place] LCSW Group Therapy  08/19/2015 2:52 PM  Type of Therapy:  Group Therapy  Participation Level:  Minimal  Participation Quality:  Attentive  Affect:  Flat  Cognitive:  Alert  Insight:  Limited  Engagement in Therapy:  Limited  Modes of Intervention:  Discussion, Education, Socialization and Support  Summary of Progress/Problems: Emotional Regulation: Patients will identify both negative and positive emotions. They will discuss emotions they have difficulty regulating and how they impact their lives. Patients will be asked to identify healthy coping skills to combat unhealthy reactions to negative emotions.  Pt attended group and stayed the entire time. Pt sat quietly and listened to other group members share.    [place] MSW, [place]  08/19/2015, 2:52 PM

## 2015-08-19 NOTE — Plan of Care (Signed)
Problem: Diagnosis: Increased Risk For Suicide Attempt Goal: STG-Patient Will Comply With Medication Regime Outcome: Progressing Pt compliant with medication regimen     

## 2015-08-19 NOTE — Progress Notes (Signed)
D: Patient had 4 small stools liquid  This shift  , no objects passed.

## 2015-08-19 NOTE — Progress Notes (Signed)
D: Observed pt in dayroom interacting. Patient alert and oriented x4. Patient denies SI/HI/AVH. Pt affect sad. Pt indicated that she went to "2-3 groups" and was tired for the rest. Pt had one loose, brown, small bowel movement this shift. No foreign objects were present in the stool.  When asked about mood pt stated "everything's fine". Pt rated depression 1/10 and anxiety 3/10. Pt indicated current source of anxiety is bowel concerns. A: Offered active listening and support. Provided therapeutic communication. Administered scheduled medications. Checked stool for foreign objects. Urged pt to continue informing staff when pt has bowel movement and to keep in hat until staff examine. R: Pt pleasant and cooperative. Pt medication compliant. Will continue Q15 min. checks. Safety maintained.

## 2015-08-19 NOTE — Progress Notes (Signed)
[facility] MD Progress Note  08/19/2015 1:32 PM JEROLDINE PRIME  MRN:  [id number]  Subjective:  [name] has no complaints today. There is no abdominal pain or discomfort. She has bowel movements every day. She had abdominal x-ray yesterday shows progression of foreign objects. Dr. Reinaldo Berber consultation is greatly appreciated. He hopes that it should not take longer than one or 2 days to expel tha swallowed objects. We'll continue her regular medications along with multiple laxatives. We are monitoring her stools for the presence of foreign objects. Sleep and appetite are okay. She participates well in groups and interacts with staff and peers appropriately.  Principal Problem: Schizoaffective disorder, bipolar type ([place]) Diagnosis:   Patient Active Problem List   Diagnosis Date Noted  . Foreign body alimentary tract [T18.9XXA]   . Foreign body ingestion [T18.9XXA] 07/31/2015  . Schizoaffective disorder, bipolar type ([place]) [F25.0] 11/06/2014  . Tardive dyskinesia [G24.01] 10/06/2014  . Tobacco use disorder [F17.200] 09/30/2014  . Hypothyroidism [E03.9] 09/29/2014  . Hypertension [I10] 09/29/2014  . Constipation [K59.00] 09/29/2014   Total Time spent with patient: 20 minutes  Past Psychiatric History: Schizoaffective disorder.  Past Medical History:  Past Medical History  Diagnosis Date  . Hallucinations 09/30/2014    Sizoaffective  . Depression   . Anxiety   . Hypertension   . Tardive dyskinesia 10/2014    recent onset  . GERD (gastroesophageal reflux disease)   . Hyperlipidemia     Past Surgical History  Procedure Laterality Date  . Wisdom tooth extraction    . Esophagogastroduodenoscopy N/A 11/28/2014    Procedure: ESOPHAGOGASTRODUODENOSCOPY (EGD);  Surgeon: [name], MD;  [facility] END[facility]COPY;  Service: Endoscopy;  Laterality: N/A;  . Abdominal surgery      "years ago" to remove foreign objects  . Breast lumpectomy     Family History:  Family History  Problem  Relation Age of Onset  . Depression Mother   . Hypertension Mother    Family Psychiatric  History: Mother with depression. Social History:  History  Alcohol Use No     History  Drug Use No    Social History   Social History  . Marital Status: Single    Spouse Name: N/A  . Number of Children: N/A  . Years of Education: N/A   Social History Main Topics  . Smoking status: Current Every Day Smoker -- 0.50 packs/day for 3 years    Types: Cigarettes  . Smokeless tobacco: Never Used  . Alcohol Use: No  . Drug Use: No  . Sexual Activity: No   Other Topics Concern  . None   Social History Narrative   Additional Social History:    Pain Medications: See PTA Prescriptions: See PTA Over the Counter: See PTA History of alcohol / drug use?: No history of alcohol / drug abuse Longest period of sobriety (when/how long): No history of use                    Sleep: Fair  Appetite:  Fair  Current Medications: Current Facility-Administered Medications  Medication Dose Route Frequency Provider Last Rate Last Dose  . acetaminophen (TYLENOL) tablet 650 mg  650 mg Oral Q6H PRN [name], MD   650 mg at 08/15/15 1952  . alum & mag hydroxide-simeth (MAALOX/MYLANTA) 200-200-20 MG/5ML suspension 30 mL  30 mL Oral Q4H PRN [name], MD   30 mL at 08/13/15 2107  . amLODipine (NORVASC) tablet 2.5 mg  2.5 mg  Oral Daily [name], MD   2.5 mg at 08/19/15 0844  . benztropine (COGENTIN) tablet 1 mg  1 mg Oral QHS [name], MD   1 mg at 08/18/15 2140  . docusate sodium (COLACE) capsule 200 mg  200 mg Oral BID [name], MD   200 mg at 08/19/15 0843  . fluPHENAZine (PROLIXIN) tablet 5 mg  5 mg Oral 3 times per day [name], MD   5 mg at 08/19/15 [id number]  . lactulose (CHRONULAC) 10 GM/15ML solution 30 g  30 g Oral TID [name], MD   30 g at 08/19/15 0840  . levothyroxine (SYNTHROID, LEVOTHROID) tablet 75 mcg  75 mcg Oral QAC breakfast [name], MD    75 mcg at 08/19/15 [id number]  . lurasidone (LATUDA) tablet 120 mg  120 mg Oral Q breakfast [name], MD   120 mg at 08/19/15 0849  . magnesium hydroxide (MILK OF MAGNESIA) suspension 30 mL  30 mL Oral Daily PRN [name], MD   30 mL at 08/09/15 2143  . milk and molasses enema  1 enema Rectal Once [name], MD   250 mL at 08/16/15 1230  . nicotine (NICODERM CQ - dosed in mg/24 hours) patch 21 mg  21 mg Transdermal Daily Doreatha Offer B Donna Snooks, MD   21 mg at 08/19/15 0847  . Oxcarbazepine (TRILEPTAL) tablet 150 mg  150 mg Oral BID [name], MD   150 mg at 08/19/15 0842  . polyethylene glycol (MIRALAX / GLYCOLAX) packet 17 g  17 g Oral BID [name], MD   17 g at 08/19/15 0841  . senna (SENOKOT) tablet 17.2 mg  2 tablet Oral QHS [name], MD   17.2 mg at 08/18/15 2140  . traZODone (DESYREL) tablet 100 mg  100 mg Oral QHS [name], MD   100 mg at 08/18/15 2140  . venlafaxine XR (EFFEXOR-XR) 24 hr capsule 150 mg  150 mg Oral Q breakfast [name], MD   150 mg at 08/19/15 0844    Lab Results: No results found for this or any previous visit (from the past 48 hour(s)).  Blood Alcohol level:  Lab Results  Component Value Date   ETH <5 07/30/2015   ETH <5 07/13/2015    Physical Findings: AIMS: Facial and Oral Movements Muscles of Facial Expression: None, normal Lips and Perioral Area: None, normal Jaw: None, normal Tongue: None, normal,Extremity Movements Upper (arms, wrists, hands, fingers): None, normal Lower (legs, knees, ankles, toes): None, normal, Trunk Movements Neck, shoulders, hips: None, normal, Overall Severity Severity of abnormal movements (highest score from questions above): None, normal Incapacitation due to abnormal movements: None, normal Patient's awareness of abnormal movements (rate only patient's report): Aware, no distress, Dental Status Current problems with teeth and/or dentures?: No Does patient usually wear dentures?:  No  CIWA:    COWS:     Musculoskeletal: Strength & Muscle Tone: within normal limits Gait & Station: normal Patient leans: N/A  Psychiatric Specialty Exam: Review of Systems  Gastrointestinal: Positive for diarrhea.  All other systems reviewed and are negative.   Blood pressure 114/73, pulse 70, temperature 98.2 F (36.8 C), temperature source Oral, resp. rate 20, height 5\' 6"  (1.676 m), weight 86.183 kg (190 lb), SpO2 100 %.Body mass index is 30.68 kg/(m^2).  General Appearance: Casual  Eye Contact::  Good  Speech:  Clear and Coherent  Volume:  Normal  Mood:  Euthymic  Affect:  Appropriate  Thought Process:  Goal Directed  Orientation:  Full (Time, Place, and Person)  Thought Content:  WDL  Suicidal Thoughts:  No  Homicidal Thoughts:  No  Memory:  Immediate;   Fair Recent;   Fair Remote;   Fair  Judgement:  Poor  Insight:  Shallow  Psychomotor Activity:  Normal  Concentration:  Fair  Recall:  [place]: Fair  Akathisia:  No  Handed:  Right  AIMS (if indicated):     Assets:  Communication Skills Desire for Improvement Financial Resources/Insurance Housing Resilience Social Support  ADL's:  Intact  Cognition: WNL  Sleep:  Number of Hours: 7.25   Treatment Plan Summary: Daily contact with patient to assess and evaluate symptoms and progress in treatment and Medication management   [name] is a 25 year old female with a history of schizoaffective disorder admitted for worsening of depression and suicide attempt by swallowing an AA battery, an earing and a screw.   1. Suicidal ideation. This has resolved. The patient is able to contract for safety. She is forward thinking and optimistic about the future.    2. Mood and psychosis. We continued Prolixin 5mg  po TID and Latuda 120mg  po daily for psychosis, Trileptal 150mg  po BID for mood stabilization, and Effexor XR 150mg  po daily for depression.   3. Hypertension. She is on  amlodipine 2.5mg  po daily.  4. Constipation. She is on bowel regimen for constipation including Colace and Miralax and bowels are moving.  5. Hypothyroidism. She is on Synthroid 50mcg po daily  6. Smoking. Nicotine patch was available.  7. Insomnia. She responded well to trazodone.  8. Social. She is an incompetent adult. Her Kara Dies is the guardian. She recently moved to [place].  9. Metabolic syndrome. Lipid panel, hemoglobin A1c, TSH, and PRL were checked during previous admission and were normal. Prolactin 15.   10. Foreign body ingestion. There are three foreign objects present initially in the colon on X-ray. She injested an AA battery on 3/15. She will continue bowel regimen and Lactuose. Input from GI and surgery is greatly appreciated. Surgery signed off. I will discontinue to discuss her case with Dr. [name], GI. There will be no surgical intervention unless there is perforation/peritonitis. Another X-ray was done yesterday showing progression. We will continue liquid diet and laxatives. GI consult is greatly appreciated.  11. Disposition.  She will be discharged back to her group home and follow up with her regular psychiatrist at [place].  Orson Slick, MD 08/19/2015, 1:32 PM

## 2015-08-19 NOTE — [facility] Group Notes (Signed)
[facility] LCSW Aftercare Discharge Planning Group Note   08/19/2015 11:24 AM  Participation Quality:  Active   Mood/Affect:  Appropriate  Depression Rating:  2  Anxiety Rating:  2  Thoughts of Suicide:  No Will you contract for safety?   NA  Current AVH:  No  Plan for Discharge/Comments:  Pt plans to return home and follow up with outpatient. She states she is feeling better and hopes her medical issues resolve soon. She reports her anxiety and depression are improving.   Transportation Means: group home   Supports: group home, family   [place] MSW, [place]

## 2015-08-19 NOTE — [facility] Group Notes (Signed)
[facility] Group Notes:  (Nursing/MHT/Case Management/Adjunct)  Date:  08/19/2015  Time:  6:24 AM  Type of Therapy:  Group Therapy  Participation Level:  Active  Participation Quality:  Appropriate  Affect:  Appropriate  Cognitive:  Appropriate  Insight:  Appropriate  Engagement in Group:  Engaged  Modes of Intervention:  n/a  Summary of Progress/Problems:  Melanie Bell 08/19/2015, 6:24 AM

## 2015-08-19 NOTE — Plan of Care (Signed)
Problem: Ineffective individual coping Goal: LTG: Patient will report a decrease in negative feelings Outcome: Progressing Out of room in groups  , participatory

## 2015-08-19 NOTE — Progress Notes (Signed)
Recreation Therapy Notes  Date: 04.05.17 Time: 3:00 pm Location: Craft Room  Group Topic: Self-esteem  Goal Area(s) Addresses:  Patient will be able to identify benefit of self-esteem. Patient will be able to identify ways to increase self-esteem.  Behavioral Response: Attentive, Interactive  Intervention: Self-Portrait  Activity: Patients were given a blank face worksheet and instructed to draw their self-portrait of how they were feeling. Patients got a different worksheet and were instructed to write their first name and something positive about themselves. Patients passed their worksheets around and wrote positive traits about peers. Patient drew their self-portrait of how they felt after they read the positive comments from peers.  Education: LRT educated patients on ways to increase their self-esteem.  Education Outcome: Acknowledges education/In group clarification offered   Clinical Observations/Feedback: Patient completed activity by drawing both self-portraits, and writing positive trait about self and peers. Patient contributed to group discussion by stating how her faces were different, and ways she can increase her self-esteem.  Leonette Monarch, LRT/CTRS 08/19/2015 2:36 PM

## 2015-08-20 NOTE — BHH Group Notes (Signed)
[facility] Group Notes:  (Nursing/MHT/Case Management/Adjunct)  Date:  08/20/2015  Time:  11:24 PM  Type of Therapy:  Evening Wrap-up Group  Participation Level:  Active  Participation Quality:  Appropriate and Attentive  Affect:  Appropriate  Cognitive:  Alert and Appropriate  Insight:  Appropriate and Improving  Engagement in Group:  Developing/Improving  Modes of Intervention:  Discussion  Summary of Progress/Problems: Patient appeared bright states she is feeling a lot better and staying out of her room more.  Melanie Bell [name] 08/20/2015, 11:24 PM

## 2015-08-20 NOTE — BHH Group Notes (Signed)
[facility] Group Notes:  (Nursing/MHT/Case Management/Adjunct)  Date:  08/20/2015  Time:  3:51 PM  Type of Therapy:  Movement Therapy  Participation Level:  Active  Participation Quality:  Appropriate  Affect:  Appropriate  Cognitive:  Alert, Appropriate and Oriented  Insight:  Appropriate  Engagement in Group:  Engaged  Modes of Intervention:  Activity and Discussion  Summary of Progress/Problems:  Melanie Bell [name] 08/20/2015, 3:51 PM

## 2015-08-20 NOTE — BHH Group Notes (Signed)
[facility] Group Notes:  (Nursing/MHT/Case Management/Adjunct)  Date:  08/20/2015  Time:  4:55 AM  Type of Therapy:  Group Therapy  Participation Level:  Active  Participation Quality:  Appropriate  Affect:  Appropriate  Cognitive:  Appropriate  Insight:  Appropriate  Engagement in Group:  Engaged  Modes of Intervention:  n/a  Summary of Progress/Problems:  Melanie Bell 08/20/2015, 4:55 AM

## 2015-08-20 NOTE — Progress Notes (Signed)
[facility] MD Progress Note  08/20/2015 11:38 AM Melanie Bell  MRN:  [id number]  Subjective:  Melanie Bell denies any symptoms of depression, anxiety, or psychosis. She is not suicidal or homicidal. She accepts her medications and tolerates them well. She is still on laxatives in order to help to expel swollen objects including an AA battery. Abdominal x-ray was performed 2 days ago and shows foreign objects in the sigmoid colon. The patient is being followed up by GI service and their input is greatly appreciated. She is compliant with all our instructions. She takes laxatives as prescribed and continues on liquid diet. Sleep is fair. There is good program participation. We expect that this patient will be discharged within a few days. The patient met with treatment team today. She did not have any complaints and asked appropriate questions.  Principal Problem: Schizoaffective disorder, bipolar type ([facility]) Diagnosis:   Patient Active Problem List   Diagnosis Date Noted  . Foreign body alimentary tract [T18.9XXA]   . Foreign body ingestion [T18.9XXA] 07/31/2015  . Schizoaffective disorder, bipolar type ([facility]) [F25.0] 11/06/2014  . Tardive dyskinesia [G24.01] 10/06/2014  . Tobacco use disorder [F17.200] 09/30/2014  . Hypothyroidism [E03.9] 09/29/2014  . Hypertension [I10] 09/29/2014  . Constipation [K59.00] 09/29/2014   Total Time spent with patient: 20 minutes  Past Psychiatric History: Schizoaffective disorder.  Past Medical History:  Past Medical History  Diagnosis Date  . Hallucinations 09/30/2014    Sizoaffective  . Depression   . Anxiety   . Hypertension   . Tardive dyskinesia 10/2014    recent onset  . GERD (gastroesophageal reflux disease)   . Hyperlipidemia     Past Surgical History  Procedure Laterality Date  . Wisdom tooth extraction    . Esophagogastroduodenoscopy N/A 11/28/2014    Procedure: ESOPHAGOGASTRODUODENOSCOPY (EGD);  Surgeon: Robert T Elliott, MD;  Location: [facility]  ENDOSCOPY;  Service: Endoscopy;  Laterality: N/A;  . Abdominal surgery      "years ago" to remove foreign objects  . Breast lumpectomy     Family History:  Family History  Problem Relation Age of Onset  . Depression Mother   . Hypertension Mother    Family Psychiatric  History: Mother with depression. Social History:  History  Alcohol Use No     History  Drug Use No    Social History   Social History  . Marital Status: Single    Spouse Name: N/A  . Number of Children: N/A  . Years of Education: N/A   Social History Main Topics  . Smoking status: Current Every Day Smoker -- 0.50 packs/day for 3 years    Types: Cigarettes  . Smokeless tobacco: Never Used  . Alcohol Use: No  . Drug Use: No  . Sexual Activity: No   Other Topics Concern  . None   Social History Narrative   Additional Social History:    Pain Medications: See PTA Prescriptions: See PTA Over the Counter: See PTA History of alcohol / drug use?: No history of alcohol / drug abuse Longest period of sobriety (when/how long): No history of use                    Sleep: Fair  Appetite:  Fair  Current Medications: Current Facility-Administered Medications  Medication Dose Route Frequency Provider Last Rate Last Dose  . acetaminophen (TYLENOL) tablet 650 mg  650 mg Oral Q6H PRN [name] [name], MD   650 mg at 08/15/15 1952  . alum &   mag hydroxide-simeth (MAALOX/MYLANTA) 200-200-20 MG/5ML suspension 30 mL  30 mL Oral Q4H PRN [name], MD   30 mL at 08/13/15 2107  . amLODipine (NORVASC) tablet 2.5 mg  2.5 mg Oral Daily [name], MD   2.5 mg at 08/20/15 0928  . benztropine (COGENTIN) tablet 1 mg  1 mg Oral QHS [name], MD   1 mg at 08/19/15 2149  . docusate sodium (COLACE) capsule 200 mg  200 mg Oral BID [name], MD   200 mg at 08/20/15 0929  . fluPHENAZine (PROLIXIN) tablet 5 mg  5 mg Oral 3 times per day [name], MD   5 mg at 08/20/15 0658  . lactulose (CHRONULAC)  10 GM/15ML solution 30 g  30 g Oral TID [name], MD   30 g at 08/20/15 0923  . levothyroxine (SYNTHROID, LEVOTHROID) tablet 75 mcg  75 mcg Oral QAC breakfast [name], MD   75 mcg at 08/20/15 [phone]  . lurasidone (LATUDA) tablet 120 mg  120 mg Oral Q breakfast [name], MD   120 mg at 08/20/15 0925  . magnesium hydroxide (MILK OF MAGNESIA) suspension 30 mL  30 mL Oral Daily PRN [name], MD   30 mL at 08/09/15 2143  . milk and molasses enema  1 enema Rectal Once [name], MD   250 mL at 08/16/15 1230  . nicotine (NICODERM CQ - dosed in mg/24 hours) patch 21 mg  21 mg Transdermal Daily Roschelle Calandra B Issachar Broady, MD   21 mg at 08/20/15 0930  . Oxcarbazepine (TRILEPTAL) tablet 150 mg  150 mg Oral BID [name], MD   150 mg at 08/20/15 0927  . polyethylene glycol (MIRALAX / GLYCOLAX) packet 17 g  17 g Oral BID [name], MD   17 g at 08/20/15 0017  . senna (SENOKOT) tablet 17.2 mg  2 tablet Oral QHS [name], MD   17.2 mg at 08/19/15 2149  . traZODone (DESYREL) tablet 100 mg  100 mg Oral QHS [name], MD   100 mg at 08/19/15 2149  . venlafaxine XR (EFFEXOR-XR) 24 hr capsule 150 mg  150 mg Oral Q breakfast [name], MD   150 mg at 08/20/15 4944    Lab Results: No results found for this or any previous visit (from the past 48 hour(s)).  Blood Alcohol level:  Lab Results  Component Value Date   ETH <5 07/30/2015   ETH <5 07/13/2015    Physical Findings: AIMS: Facial and Oral Movements Muscles of Facial Expression: None, normal Lips and Perioral Area: None, normal Jaw: None, normal Tongue: None, normal,Extremity Movements Upper (arms, wrists, hands, fingers): None, normal Lower (legs, knees, ankles, toes): None, normal, Trunk Movements Neck, shoulders, hips: None, normal, Overall Severity Severity of abnormal movements (highest score from questions above): None, normal Incapacitation due to abnormal movements: None,  normal Patient's awareness of abnormal movements (rate only patient's report): Aware, no distress, Dental Status Current problems with teeth and/or dentures?: No Does patient usually wear dentures?: No  CIWA:    COWS:     Musculoskeletal: Strength & Muscle Tone: within normal limits Gait & Station: normal Patient leans: N/A  Psychiatric Specialty Exam: Review of Systems  Gastrointestinal: Positive for diarrhea.  All other systems reviewed and are negative.   Blood pressure 119/69, pulse 82, temperature 98.4 F (36.9 C), temperature source Oral, resp. rate 20, height 5' 6" (  1.676 m), weight 86.183 kg (190 lb), SpO2 100 %.Body mass index is 30.68 kg/(m^2).  General Appearance: Casual  Eye Contact::  Good  Speech:  Clear and Coherent  Volume:  Normal  Mood:  Euthymic  Affect:  Appropriate  Thought Process:  Goal Directed  Orientation:  Full (Time, Place, and Person)  Thought Content:  WDL  Suicidal Thoughts:  No  Homicidal Thoughts:  No  Memory:  Immediate;   Fair Recent;   Fair Remote;   Fair  Judgement:  Poor  Insight:  Shallow  Psychomotor Activity:  Normal  Concentration:  Fair  Recall:  [place]: Fair  Akathisia:  No  Handed:  Right  AIMS (if indicated):     Assets:  Communication Skills Desire for Improvement Financial Resources/Insurance Housing Resilience Social Support  ADL's:  Intact  Cognition: WNL  Sleep:  Number of Hours: 8   Treatment Plan Summary: Daily contact with patient to assess and evaluate symptoms and progress in treatment and Medication management   Melanie Bell is a 25 year old female with a history of schizoaffective disorder admitted for worsening of depression and suicide attempt by swallowing an AA battery, an earing and a screw.   1. Suicidal ideation. This has resolved. The patient is able to contract for safety. She is forward thinking and optimistic about the future.    2. Mood and psychosis. We  continued Prolixin 21m po TID and Latuda 1234mpo daily for psychosis, Trileptal 15039mo BID for mood stabilization, and Effexor XR 150m71m daily for depression.   3. Hypertension. She is on amlodipine 2.5mg 48mdaily.  4. Constipation. She is on bowel regimen for constipation including Colace and Miralax and bowels are moving.  5. Hypothyroidism. She is on Synthroid 75mcg68mdaily  6. Smoking. Nicotine patch was available.  7. Insomnia. She responded well to trazodone.  8. Social. She is an incompetent adult. Her matherKara Diese guardian. [place] Lipid panel, hemoglobin A1c, TSH, and PRL were checked during previous admission and were normal. Prolactin 15.   10. Foreign body ingestion. There are three foreign objects present initially in the colon on X-ray. She injested an AA battery on 3/15. She will continue bowel regimen and Lactuose. Input from GI and surgery is greatly appreciated. Surgery signed off. I will discontinue to discuss her case with Dr. SkulskGustavo LahThere will be no surgical intervention unless there is perforation/peritonitis. Another X-ray was done yesterday showing progression. We will continue liquid diet and laxatives. GI consult is greatly appreciated.  11. Disposition. She will be discharged back to her group home and [facility]. She will continue in the day program.  JolantOrson Slick/10/2015, 11:38 AM

## 2015-08-20 NOTE — Tx Team (Signed)
Interdisciplinary Treatment Plan Update (Adult)  Date:  08/20/2015 Time Reviewed:  3:17 PM  Progress in Treatment: Attending groups: Yes. Participating in groups:  Yes. Taking medication as prescribed:  Yes. Tolerating medication:  Yes. Family/Significant othe contact made:  Yes, individual(s) contacted:  mother and group home Patient understands diagnosis:  Yes. Discussing patient identified problems/goals with staff:  Yes. Medical problems stabilized or resolved:  No. and As evidenced by:  Pt has not passed the battery yet.  Denies suicidal/homicidal ideation: Yes. Issues/concerns per patient self-inventory:  Yes. Other:  New problem(s) identified: No, Describe:  NA  Discharge Plan or Barriers: Pt plans to return home and follow up with outpatient.    Reason for Continuation of Hospitalization: Hallucinations Medication stabilization Other; describe Pt swallowed objects  Comments: [name] is a 25 year old female with a history of schizoaffective disorder. She was briefly admitted to medical floor after she swallowed 3 foreign objects including a screw, AA battery and an earring. She was transferred to psychiatry before she passed them. The battery has been there since March 15. Apparently about that he should not stick around the gap for longer than a week according to GI note. She is now being followed by Dr. [name], GI. Surgery refuses to operate until there is perforation and/or peritonitis. The patient is no longer in need of psychiatric care but I could not discharge her to home given her medical condition. She is on liquid diet and laxatives.  [name] denies any symptoms of depression, anxiety, or psychosis. She denies any abdominal pain. In spite over multiple laxatives and lactulose she has not had a bowel movement yesterday. We will give mag citrate again today as her x-ray indicates massive amounts of stool in the bowels. She accepts medications and tolerates them  well. She participates well in programming.   Estimated length of stay: up to 4 days, expected discharge by Monday 08/24/15  New goal(s): NA  Review of initial/current patient goals per problem list:   1.  Goal(s): Patient will participate in aftercare plan * Met: Yes * Target date: at discharge * As evidenced by: Patient will participate within aftercare plan AEB aftercare provider and housing plan at discharge being identified. 08/18/15: Patient has a provider and will be referred to [facility] and has a group home which she can return to at discharge. Goal met.    2.  Goal (s): Patient will exhibit decreased depressive symptoms and suicidal ideations. * Met: Yes *  Target date: at discharge * As evidenced by: Patient will utilize self rating of depression at 3 or below and demonstrate decreased signs of depression or be deemed stable for discharge by MD. 08/18/15: Patient reports no SI and depression of 0. Goal met.   3.  Goal (s): Patient will demonstrate decreased symptoms of psychosis. * Met: No  *  Target date: at discharge * As evidenced by: Patient will not endorse signs of psychosis or be deemed stable for discharge by MD.  08/18/15: Patient reports no psychosis but is awaiting a batter to pass through her intestines before discharge.  08/20/15: Patient is still awaiting battery which she swallowed to pass and will discharge once this happens. MD is stating this could happen in the next couple of days.   Attendees: Physician:   Orson Slick, MD 4/6/20173:17 PM  Nursing:   Elige Radon, RN  4/6/20173:17 PM  Other:  Fransisca Connors 4/6/20173:17 PM  Other:  Everitt Amber, LRT  4/6/20173:17 PM  Patient:  Melanie Bell  4/6/20173:17 PM  Other:  Abran Cantor, RN 4/6/20173:17 PM  Other:  4/6/20173:17 PM  Other:  4/6/20173:17 PM  Other:  4/6/20173:17 PM  Other:  4/6/20173:17 PM  Other:  4/6/20173:17 PM  Other:   4/6/20173:17 PM   Scribe for Treatment Team:   Keene Breath,  MSW, LCSW  08/20/2015, 3:17 PM

## 2015-08-20 NOTE — [facility] Group Notes (Signed)
[place] LCSW Group Therapy  08/20/2015 3:47 PM  Type of Therapy:  Group Therapy  Participation Level: Minimal   Participation Quality:  Attentive   Affect: Appropriate    Cognitive:  Alert   Insight:  Improving   Engagement in Therapy: Improving    Modes of Intervention:  Discussion, Education, Socialization and Support  Summary of Progress/Problems: Balance in life: Patients will discuss the concept of balance and how it looks and feels to be unbalanced. Pt will identify areas in their life that is unbalanced and ways to become more balanced. Pt attended grop and stayed the entire time. Pt sat quietly and listened to other group members share.     [place] MSW, [place]  08/20/2015, 3:47 PM

## 2015-08-20 NOTE — Clinical Social Work Note (Signed)
Clinical Social Work Assessment  Patient Details  [name] MRN: [name] Date of Birth: 18-Sep-1990  CSW spoke with patient's mother/guardian who lives in [place] [phone] and discussed change of guardianship with her being out of state but mom/guardian wants to keep guardianship. CSW also informed that xrays are being performed to keep track of the battery movement in patient intestines and MD is reporting that this could happen in the next couple of days and patient will be discharged once this happens.   Keene Breath, LCSW 08/20/2015, 3:14 PM

## 2015-08-20 NOTE — Progress Notes (Signed)
D: Pt is observed in the milieu interacting appropriately with staff and peers. She denies SI/HI/AVH at this time. Denies pain. Pt reports that her goal for today was to "work on my coping skills." Pt states that she uses skills such as writing, drawing, and reading her bible when feeling anxious or depressed. Pt rates anxiety as a 1 out of 10 at this time. Pt has one liquid, brown BM this evening. No foreign objects are present in the stool. A: Emotional support and encouragement provided. Medications administered with education. q15 minute safety checks maintained. R: Pt remains free from harm. Will continue to monitor.

## 2015-08-20 NOTE — Progress Notes (Signed)
D: Patient stated slept good last night .Stated appetite is good and energy level  Is normal. Stated concentration is good . Stated on Depression scale 3 , hopeless 2 and anxiety 4 .( low 0-10 high) Denies suicidal  homicidal ideations  .  No auditory hallucinations  No pain concerns . Appropriate ADL'S. Interacting with peers and staff.  Patient continue to have liquid stools  With no  Objects  Found. Stated she is working on Radiographer, therapeutic  A: Encourage patient participation with unit programming . Instruction  Given on  Medication , verbalize understanding. R: Voice no other concerns. Staff continue to monitor

## 2015-08-20 NOTE — Progress Notes (Signed)
Recreation Therapy Notes  Date: 04.06.17 Time: 1:00 pm Location: Craft Room  Group Topic: Leisure Education  Goal Area(s) Addresses:  Patient will identify things they are grateful for. Patient will identify how being grateful can influence decision making.  Behavioral Response: Did not attend  Intervention: Grateful Wheel  Activity: Patients were given an I Am Grateful For worksheet and instructed to write 2-3 things they are grateful for under each category.  Education: LRT educated patients on why it is important to think about things they are grateful for.  Education Outcome: Patient did not attend group.  Clinical Observations/Feedback: Patient did not attend group.  Leonette Monarch, LRT/CTRS 08/20/2015 2:18 PM

## 2015-08-20 NOTE — Plan of Care (Signed)
Problem: Alteration in thought process Goal: STG-Patient is able to follow short directions Outcome: Progressing Pt follows directions from staff regarding medication administration and reporting BMs.  Problem: Diagnosis: Increased Risk For Suicide Attempt Goal: LTG-Patient will be able to identify a plan to address LTG - Patient will be able to identify a plan to address suicidal feelings after discharge  Outcome: Progressing Pt discusses with writer coping skills she will use to address SI and anxiety after d/c, including writing, drawing, and reading the bible.

## 2015-08-20 NOTE — Plan of Care (Signed)
Problem: Ineffective individual coping Goal: STG-Increase in ability to manage activities of daily living Outcome: Progressing Encourage participation  With unit programing

## 2015-08-21 ENCOUNTER — Inpatient Hospital Stay: Payer: MEDICAID

## 2015-08-21 IMAGING — CR DG ABDOMEN 2V
1 series · 2 of 2 positions shown · non-contrast
Comparison: [DATE]

CLINICAL DATA: Foreign body ingestion

EXAM:
ABDOMEN - 2 VIEW

[Series 1: dg abd 2 views · 0.14mm/px · 2 of 2 slices shown]
[im 1/2]
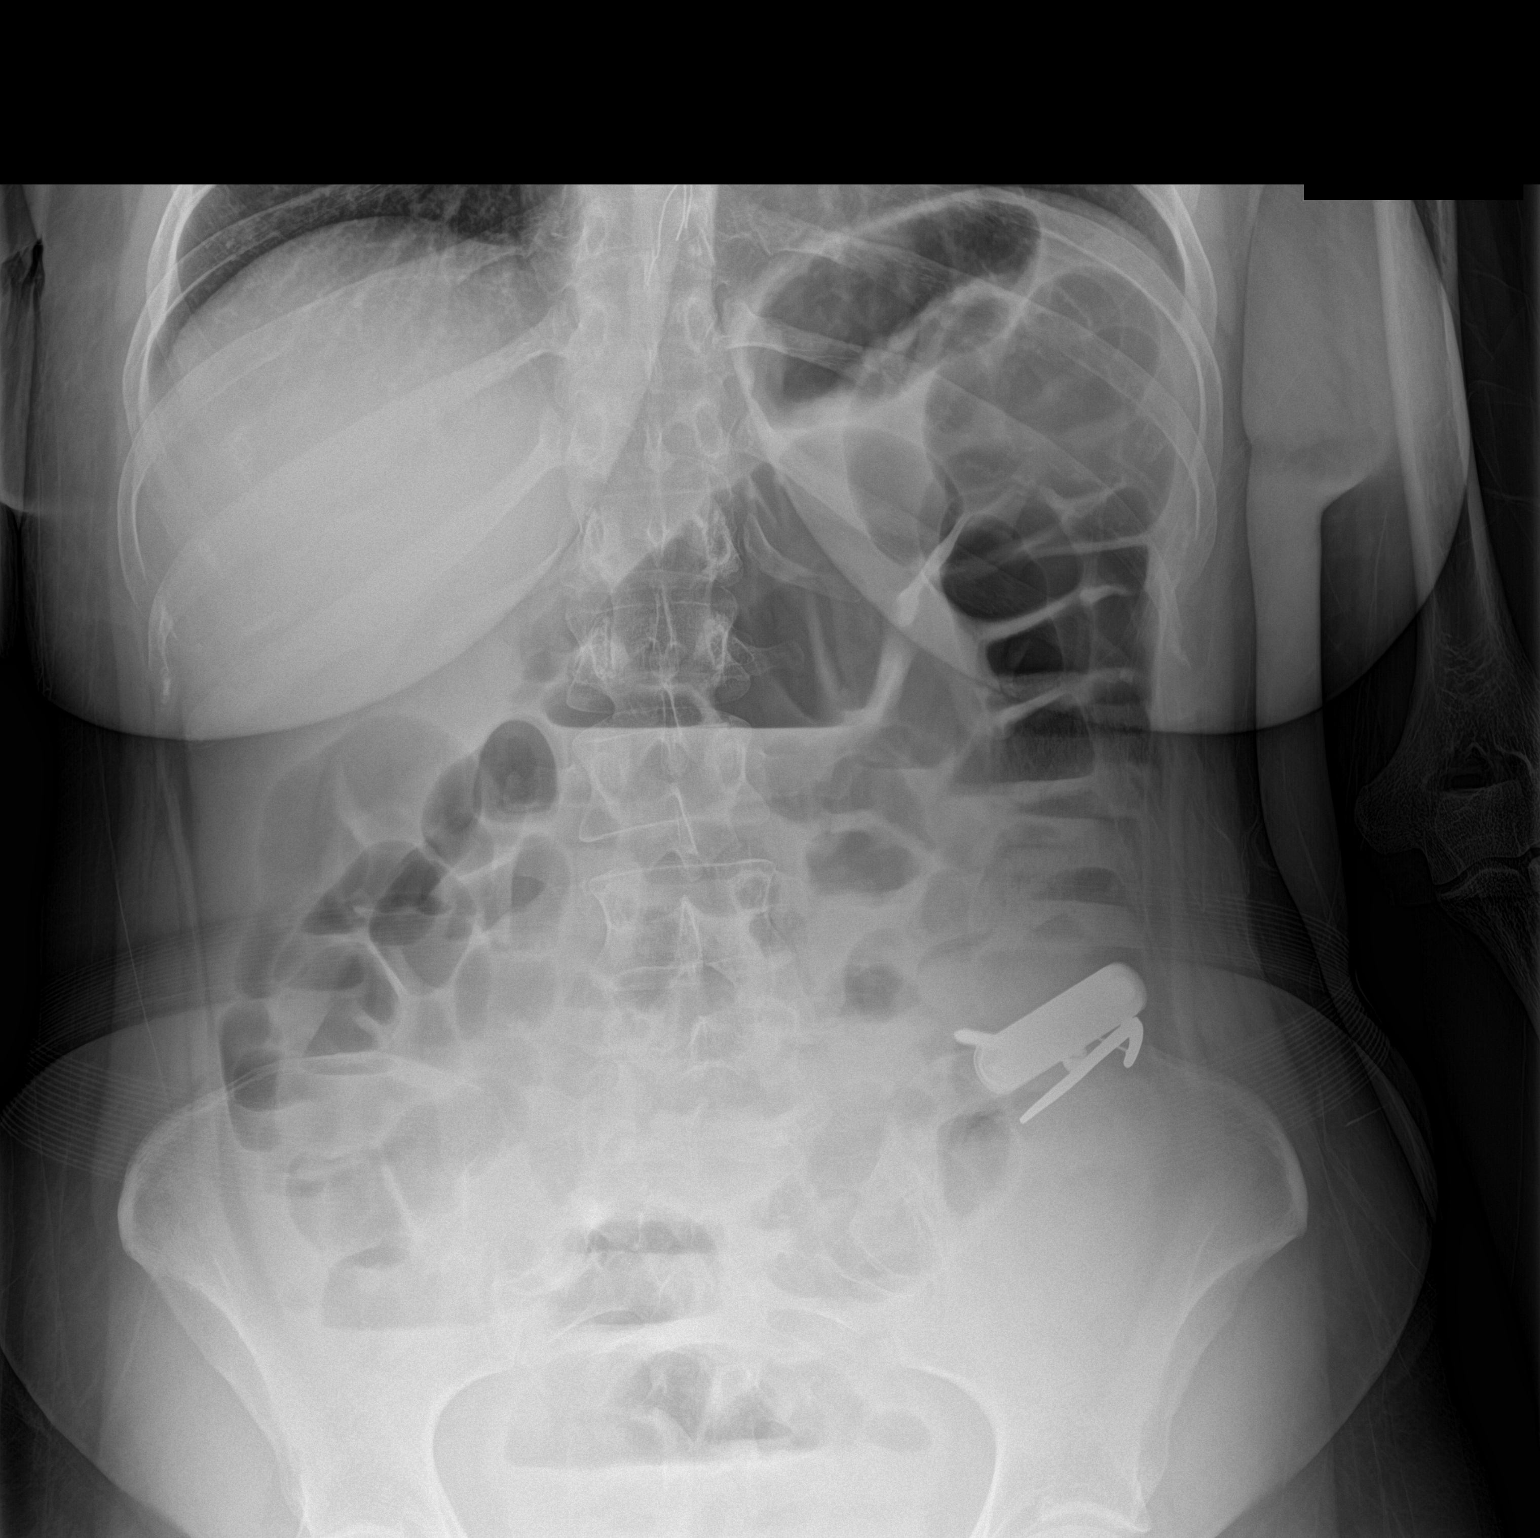
[im 2/2]
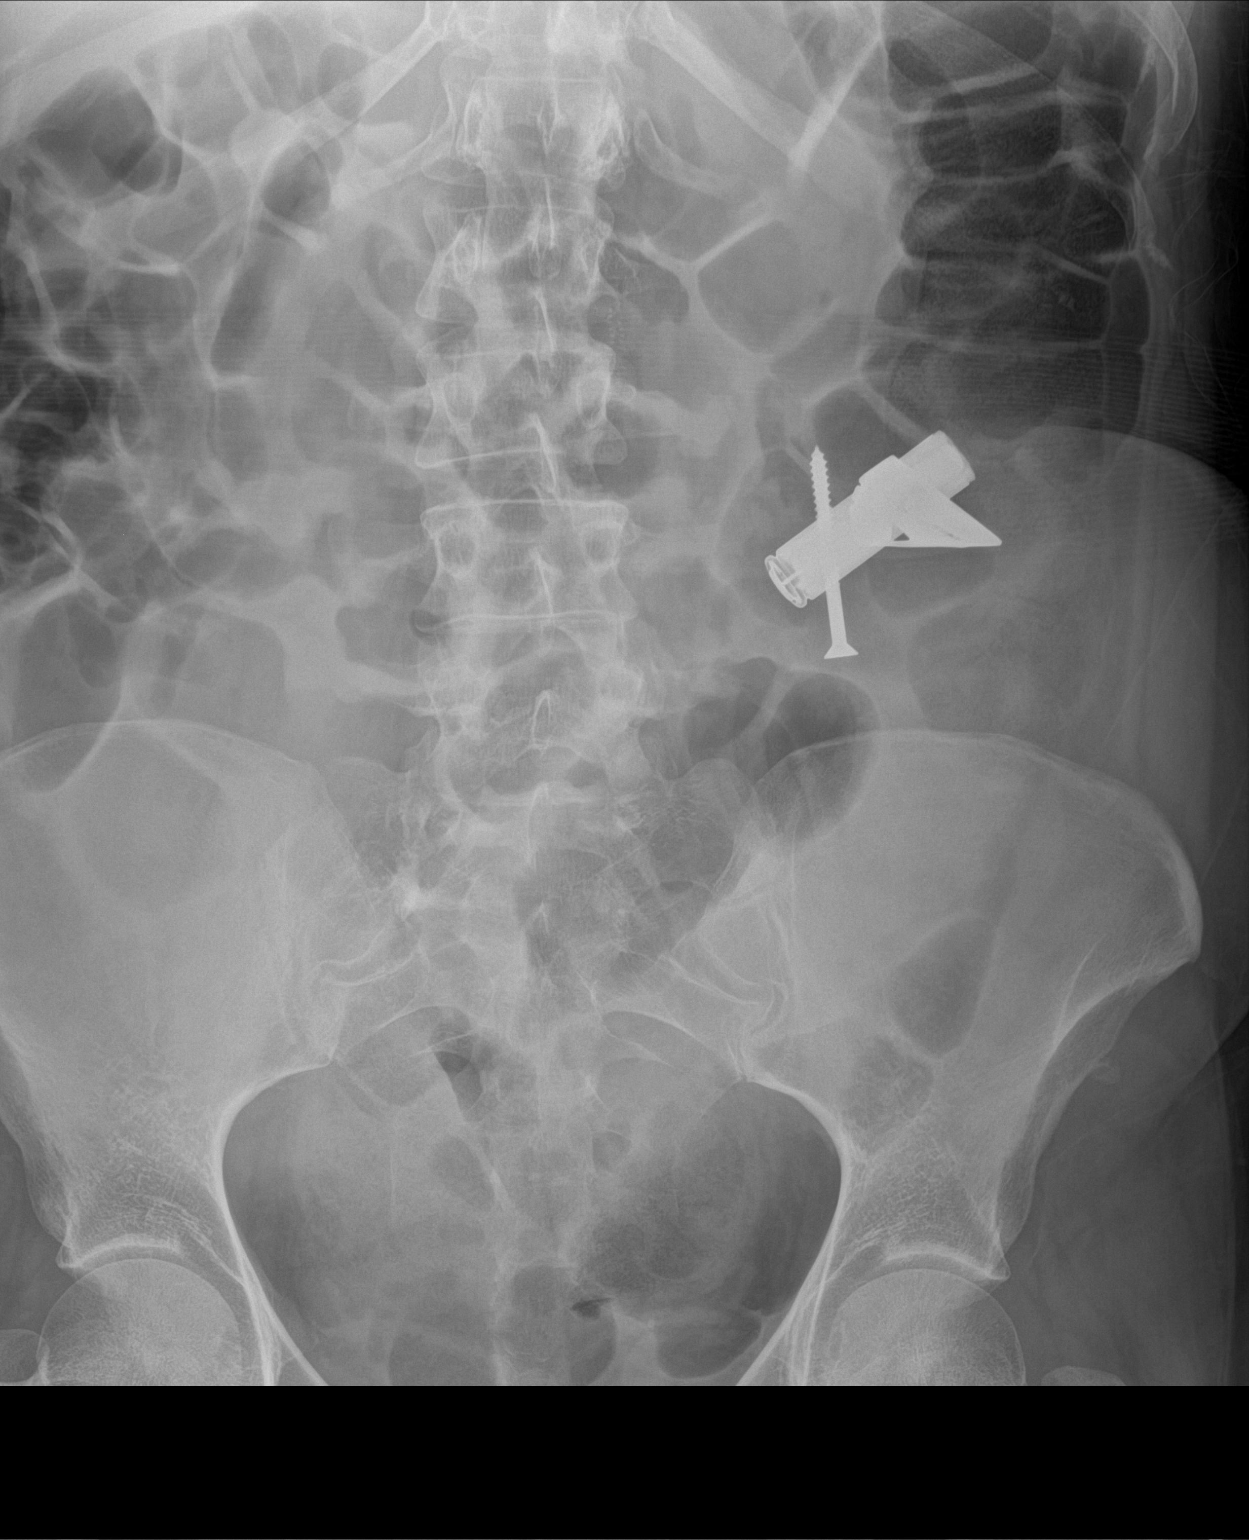

[2 of 2 positions shown; findings below may reference images not displayed]

FINDINGS: The previously noted metallic foreign bodies are currently located
in the left mid to lower abdomen in the region of the distal
transverse colon. The screw, plate, and cylindrical metallic device
are tightly grouped currently. There is no appreciable bowel
dilatation. There are scattered air-fluid levels, however,
suggesting inflammation of the bowel. No free air is evident. Lung
bases are clear.
IMPRESSION: The metallic foreign bodies are present in the left abdomen, likely
in the distal transverse colon. No free air. Air-fluid levels
suggest enterocolitis.

## 2015-08-21 NOTE — Consult Note (Signed)
X-ray suggests objects in distal colon.  Not much advancement since last film.  VSS afebrile.  Will repeat film Sunday.  Covering for Dr. [name].  Nothing for me to do at this time.

## 2015-08-21 NOTE — Plan of Care (Signed)
Problem: Alteration in thought process Goal: LTG-Patient has not harmed self or others in at least 2 days Outcome: Progressing Patient has not harmed herself in at least 2 days

## 2015-08-21 NOTE — [facility] Group Notes (Signed)
[facility] LCSW Group Therapy  08/21/2015 10:39 AM  Type of Therapy:  Group Therapy  Participation Level:  Did Not Attend  Modes of Intervention:  Discussion, Education, Socialization and Support  Summary of Progress/Problems: Feelings around Relapse. Group members discussed the meaning of relapse and shared personal stories of relapse, how it affected them and others, and how they perceived themselves during this time. Group members were encouraged to identify triggers, warning signs and coping skills used when facing the possibility of relapse. Social supports were discussed and explored in detail.   Brailey Buescher L Makaylah Oddo MSW, LCSWA  08/21/2015, 10:39 AM   

## 2015-08-21 NOTE — Progress Notes (Signed)
Recreation Therapy Notes  Date: 04.07.17 Time: 1:00 pm Location: Craft Room  Group Topic: Communication, Problem Solving, Teamwork  Goal Area(s) Addresses:  Patient will work in teams towards shared goal. Patient will verbalize skills needed to make activity successful. Patient will verbalize benefit of using skills identified to reach post d/c goals.  Behavioral Response: Did not attend  Intervention: Landing Pad  Activity: Patients were divided into teams and instructed to build a landing pad out of 15 straws and approximately 3 feet of tape that would catch a golf ball that was dropped from approximately 4 feet.  Education: LRT educated patients on healthy support systems.  Education Outcome: Patient did not attend group.   Clinical Observations/Feedback: Patient did not attend group.  Leonette Monarch, LRT/CTRS 08/21/2015 2:53 PM

## 2015-08-21 NOTE — Plan of Care (Signed)
Problem: Consults Goal: Suicide Risk Patient Education (See Patient Education module for education specifics)  Outcome: Progressing Patient verbalizes no s/s of SI/HI at this time [employer]

## 2015-08-21 NOTE — Progress Notes (Signed)
D: Patient is alert and oriented on the unit this shift. Patient attended and actively participated in groups today. Patient denies suicidal ideation, homicidal ideation, auditory or visual hallucinations at the present time.  A: Scheduled medications are administered to patient as per MD orders. Emotional support and encouragement are provided. Patient is maintained on q.15 minute safety checks. Patient is informed to notify staff with questions or concerns. R: No adverse medication reactions are noted. Patient is cooperative with medication administration and treatment plan today. Patient is receptive, calm and cooperative on the unit at this time. Patient has limited intreractions with others on the unit this shift. Patient contracts for safety at this time. Patient remains safe at this time.

## 2015-08-21 NOTE — Progress Notes (Signed)
[facility] MD Progress Note  08/21/2015 10:51 AM Melanie Bell  MRN:  [name]  Subjective:  Melanie Bell had several loose stools since yesterday but she did not produce the battery, the earing or the screw. He continues on laxatives and liquid diet. She denies any symptoms of depression, anxiety, or psychosis. She is not suicidal or homicidal. She accepts medications and tolerates them well. Sleep and appetite are good. There is good group participation. The patient will not be discharge over the weekend as her group home is unable to accept her. Anticipate discharge on Monday if she passes the following objects. And we will follow up with x-ray today or tomorrow.   Principal Problem: Schizoaffective disorder, bipolar type ([place]) Diagnosis:   Patient Active Problem List   Diagnosis Date Noted  . Foreign body alimentary tract [T18.9XXA]   . Foreign body ingestion [T18.9XXA] 07/31/2015  . Schizoaffective disorder, bipolar type ([place]) [F25.0] 11/06/2014  . Tardive dyskinesia [G24.01] 10/06/2014  . Tobacco use disorder [F17.200] 09/30/2014  . Hypothyroidism [E03.9] 09/29/2014  . Hypertension [I10] 09/29/2014  . Constipation [K59.00] 09/29/2014   Total Time spent with patient: 20 minutes  Past Psychiatric History: Schizoaffective disorder.  Past Medical History:  Past Medical History  Diagnosis Date  . Hallucinations 09/30/2014    Sizoaffective  . Depression   . Anxiety   . Hypertension   . Tardive dyskinesia 10/2014    recent onset  . GERD (gastroesophageal reflux disease)   . Hyperlipidemia     Past Surgical History  Procedure Laterality Date  . Wisdom tooth extraction    . Esophagogastroduodenoscopy N/A 11/28/2014    Procedure: ESOPHAGOGASTRODUODENOSCOPY (EGD);  Surgeon: [name], MD;  Location: [facility] ENDOSCOPY;  Service: Endoscopy;  Laterality: N/A;  . Abdominal surgery      "years ago" to remove foreign objects  . Breast lumpectomy     Family History:  Family History   Problem Relation Age of Onset  . Depression Mother   . Hypertension Mother    Family Psychiatric  History: Mother with depression.  Social History:  History  Alcohol Use No     History  Drug Use No    Social History   Social History  . Marital Status: Single    Spouse Name: N/A  . Number of Children: N/A  . Years of Education: N/A   Social History Main Topics  . Smoking status: Current Every Day Smoker -- 0.50 packs/day for 3 years    Types: Cigarettes  . Smokeless tobacco: Never Used  . Alcohol Use: No  . Drug Use: No  . Sexual Activity: No   Other Topics Concern  . None   Social History Narrative   Additional Social History:    Pain Medications: See PTA Prescriptions: See PTA Over the Counter: See PTA History of alcohol / drug use?: No history of alcohol / drug abuse Longest period of sobriety (when/how long): No history of use                    Sleep: Fair  Appetite:  Fair  Current Medications: Current Facility-Administered Medications  Medication Dose Route Frequency Provider Last Rate Last Dose  . acetaminophen (TYLENOL) tablet 650 mg  650 mg Oral Q6H PRN [name], MD   650 mg at 08/15/15 1952  . alum & mag hydroxide-simeth (MAALOX/MYLANTA) 200-200-20 MG/5ML suspension 30 mL  30 mL Oral Q4H PRN [name], MD   30 mL at 08/13/15 2107  .  amLODipine (NORVASC) tablet 2.5 mg  2.5 mg Oral Daily [name], MD   2.5 mg at 08/21/15 0937  . benztropine (COGENTIN) tablet 1 mg  1 mg Oral QHS [name], MD   1 mg at 08/20/15 2211  . docusate sodium (COLACE) capsule 200 mg  200 mg Oral BID [name], MD   200 mg at 08/21/15 [id number]  . fluPHENAZine (PROLIXIN) tablet 5 mg  5 mg Oral 3 times per day [name], MD   5 mg at 08/21/15 0657  . lactulose (CHRONULAC) 10 GM/15ML solution 30 g  30 g Oral TID [name], MD   30 g at 08/21/15 0937  . levothyroxine (SYNTHROID, LEVOTHROID) tablet 75 mcg  75 mcg Oral QAC breakfast [name], MD   75 mcg at 08/21/15 0657  . lurasidone (LATUDA) tablet 120 mg  120 mg Oral Q breakfast [name], MD   120 mg at 08/21/15 0829  . magnesium hydroxide (MILK OF MAGNESIA) suspension 30 mL  30 mL Oral Daily PRN [name], MD   30 mL at 08/09/15 2143  . milk and molasses enema  1 enema Rectal Once [name], MD   250 mL at 08/16/15 1230  . nicotine (NICODERM CQ - dosed in mg/24 hours) patch 21 mg  21 mg Transdermal Daily [name], MD   21 mg at 08/21/15 0939  . Oxcarbazepine (TRILEPTAL) tablet 150 mg  150 mg Oral BID [name], MD   150 mg at 08/21/15 0936  . polyethylene glycol (MIRALAX / GLYCOLAX) packet 17 g  17 g Oral BID [name], MD   17 g at 08/21/15 0936  . senna (SENOKOT) tablet 17.2 mg  2 tablet Oral QHS [name], MD   17.2 mg at 08/20/15 2213  . traZODone (DESYREL) tablet 100 mg  100 mg Oral QHS [name], MD   100 mg at 08/20/15 2213  . venlafaxine XR (EFFEXOR-XR) 24 hr capsule 150 mg  150 mg Oral Q breakfast [name], MD   150 mg at 08/21/15 [id number]    Lab Results: No results found for this or any previous visit (from the past 48 hour(s)).  Blood Alcohol level:  Lab Results  Component Value Date   ETH <5 07/30/2015   ETH <5 07/13/2015    Physical Findings: AIMS: Facial and Oral Movements Muscles of Facial Expression: None, normal Lips and Perioral Area: None, normal Jaw: None, normal Tongue: None, normal,Extremity Movements Upper (arms, wrists, hands, fingers): None, normal Lower (legs, knees, ankles, toes): None, normal, Trunk Movements Neck, shoulders, hips: None, normal, Overall Severity Severity of abnormal movements (highest score from questions above): None, normal Incapacitation due to abnormal movements: None, normal Patient's awareness of abnormal movements (rate only patient's report): Aware, no distress, Dental Status Current problems with teeth and/or dentures?: No Does patient usually wear  dentures?: No  CIWA:    COWS:     Musculoskeletal: Strength & Muscle Tone: within normal limits Gait & Station: normal Patient leans: N/A  Psychiatric Specialty Exam: Review of Systems  Gastrointestinal: Positive for diarrhea.  All other systems reviewed and are negative.   Blood pressure 112/72, pulse 75, temperature 98 F (36.7 C), temperature source Oral, resp. rate 20, height 5\' 6"  (1.676 m), weight 86.183 kg (190 lb), SpO2 100 %.Body mass index is 30.68 kg/(m^2).  General Appearance: Casual  Eye Contact::  Good  Speech:  Clear and Coherent  Volume:  Normal  Mood:  Euthymic  Affect:  Appropriate  Thought Process:  Goal Directed  Orientation:  Full (Time, Place, and Person)  Thought Content:  WDL  Suicidal Thoughts:  No  Homicidal Thoughts:  No  Memory:  Immediate;   Fair Recent;   Fair Remote;   Fair  Judgement:  Impaired  Insight:  Shallow  Psychomotor Activity:  Normal  Concentration:  Fair  Recall:  Melanie Bell  Language: Fair  Akathisia:  No  Handed:  Right  AIMS (if indicated):     Assets:  Communication Skills Desire for Improvement Financial Resources/Insurance Housing Physical Health Resilience Social Support  ADL's:  Intact  Cognition: WNL  Sleep:  Number of Hours: 8   Treatment Plan Summary: Daily contact with patient to assess and evaluate symptoms and progress in treatment and Medication management   Melanie Bell is a 25 year old female with a history of schizoaffective disorder admitted for worsening of depression and suicide attempt by swallowing an AA battery, an earing and a screw.   1. Suicidal ideation. This has resolved. The patient is able to contract for safety. She is forward thinking and optimistic about the future.    2. Mood and psychosis. We continued Prolixin 5mg  po TID and Latuda 120mg  po daily for psychosis, Trileptal 150mg  po BID for mood stabilization, and Effexor XR 150mg  po daily for depression.   3.  Hypertension. She is on amlodipine 2.5mg  po daily.  4. Constipation. She is on bowel regimen for constipation including Colace and Miralax and bowels are moving.  5. Hypothyroidism. She is on Synthroid 29mcg po daily  6. Smoking. Nicotine patch was available.  7. Insomnia. She responded well to trazodone.  8. Social. She is an incompetent adult. Her Kara Dies is the guardian. She recently moved to [place].  9. Metabolic syndrome. Lipid panel, hemoglobin A1c, TSH, and PRL were checked during previous admission and were normal. Prolactin 15.   10. Foreign body ingestion. There are three foreign objects present initially in the colon on X-ray. She injested an AA battery on 3/15. She will continue bowel regimen and Lactuose. Input from GI and surgery is greatly appreciated. Surgery signed off. I will discontinue to discuss her case with Dr. [name], GI. There will be no surgical intervention unless there is perforation/peritonitis. Another X-ray was done yesterday showing progression. We will continue liquid diet and laxatives. GI consult is greatly appreciated.  11. Disposition. She will be discharged back to her group home and follow up with her regular psychiatrist at [facility] for medication management. She will continue in the day program.   Orson Slick, MD 08/21/2015, 10:51 AM

## 2015-08-21 NOTE — [facility] Group Notes (Signed)
[place] Group Notes:  (Nursing/MHT/Case Management/Adjunct)  Date:  08/21/2015  Time:  4:02 PM  Type of Therapy:  Group Therapy  Participation Level:  Active  Participation Quality:  Sharing  Affect:  Appropriate  Cognitive:  Appropriate  Insight:  Good  Engagement in Group:  Engaged  Modes of Intervention:  Support  Summary of Progress/Problems:  Melanie Bell 08/21/2015, 4:02 PM

## 2015-08-21 NOTE — Progress Notes (Signed)
D:  Patient is alert and oriented on the unit this shift.  Patient attended and actively participated in afternoon groups today.  Patient denies suicidal ideation, homicidal ideation, auditory or visual hallucinations at the present time.  Patient had a bowel movement and no foreign objects were found. A:  Scheduled medications are administered to patient as per MD orders.  Emotional support and encouragement are provided.  Patient is maintained on q.15 minute safety checks.  Patient is informed to notify staff with questions or concerns. R:  No adverse medication reactions are noted.  Patient is cooperative with medication administration and treatment plan today.  Patient is receptive, calm and cooperative on the unit at this time.  Patient interacts well with others on the unit this shift.  Patient contracts for safety at this time.  Patient remains safe at this time.

## 2015-08-21 NOTE — Plan of Care (Signed)
Problem: Alteration in thought process Goal: LTG-Patient is able to perceive the environment accurately Outcome: Progressing Patient is able to perceive the environment accurately at the current time

## 2015-08-21 NOTE — Progress Notes (Signed)
D: Pt denies SI/HI/AVH. Pt is pleasant and cooperative. Pt appears less anxious and she is interacting with peers and staff appropriately, on bowel protocol/ swallowing foreign objects. Patient had small BM no foreign object noted in the faeces.  A: Pt was offered support and encouragement. Pt was given scheduled medications. Pt was encouraged to attend groups. Q 15 minute checks were done for safety.  R:Pt attends groups and interacts well with peers and staff. Pt is taking medication. Pt has no complaints.Pt receptive to treatment and safety maintained on unit.

## 2015-08-21 NOTE — Plan of Care (Signed)
Problem: Alteration in thought process Goal: LTG-Patient behavior demonstrates decreased signs psychosis (Patient behavior demonstrates decreased signs of psychosis to the point the patient is safe to return home and continue treatment in an outpatient setting.)  Outcome: Progressing Patient less psychotic.

## 2015-08-22 NOTE — [facility] Group Notes (Signed)
[place] Group Notes:  (Nursing/MHT/Case Management/Adjunct)  Date:  08/22/2015  Time:  10:41 AM  Type of Therapy:  goal setting   Participation Level:  Did Not Attend  Charise Killian 08/22/2015, 10:41 AM

## 2015-08-22 NOTE — Progress Notes (Signed)
[facility] MD Progress Note  08/22/2015 2:18 PM Melanie Bell  MRN:  [name]  Subjective:  Melanie Bell symptoms of depression, anxiety or psychosis. She is not suicidal or homicidal. She does feel tired from this extended hospitalization and wishes she could go home. She has regular loose bowel movements. She did not produce the battery. X-ray yesterday indicates that all three foreign objects are moving along GI tract. GI input is greatly appreciated. She accepts medications and tolerated them well accident group participation.  Principal Problem: Schizoaffective disorder, bipolar type ([place]) Diagnosis:   Patient Active Problem List   Diagnosis Date Noted  . Foreign body alimentary tract [T18.9XXA]   . Foreign body ingestion [T18.9XXA] 07/31/2015  . Schizoaffective disorder, bipolar type ([place]) [F25.0] 11/06/2014  . Tardive dyskinesia [G24.01] 10/06/2014  . Tobacco use disorder [F17.200] 09/30/2014  . Hypothyroidism [E03.9] 09/29/2014  . Hypertension [I10] 09/29/2014  . Constipation [K59.00] 09/29/2014   Total Time spent with patient: 20 minutes  Past Psychiatric History: Schizoaffective disorder.  Past Medical History:  Past Medical History  Diagnosis Date  . Hallucinations 09/30/2014    Sizoaffective  . Depression   . Anxiety   . Hypertension   . Tardive dyskinesia 10/2014    recent onset  . GERD (gastroesophageal reflux disease)   . Hyperlipidemia     Past Surgical History  Procedure Laterality Date  . Wisdom tooth extraction    . Esophagogastroduodenoscopy N/A 11/28/2014    Procedure: ESOPHAGOGASTRODUODENOSCOPY (EGD);  Surgeon: [name], MD;  Location: [facility] ENDOSCOPY;  Service: Endoscopy;  Laterality: N/A;  . Abdominal surgery      "years ago" to remove foreign objects  . Breast lumpectomy     Family History:  Family History  Problem Relation Age of Onset  . Depression Mother   . Hypertension Mother    Family Psychiatric  History: Mother with  depression. Social History:  History  Alcohol Use No     History  Drug Use No    Social History   Social History  . Marital Status: Single    Spouse Name: N/A  . Number of Children: N/A  . Years of Education: N/A   Social History Main Topics  . Smoking status: Current Every Day Smoker -- 0.50 packs/day for 3 years    Types: Cigarettes  . Smokeless tobacco: Never Used  . Alcohol Use: No  . Drug Use: No  . Sexual Activity: No   Other Topics Concern  . None   Social History Narrative   Additional Social History:    Pain Medications: See PTA Prescriptions: See PTA Over the Counter: See PTA History of alcohol / drug use?: No history of alcohol / drug abuse Longest period of sobriety (when/how long): No history of use                    Sleep: Fair  Appetite:  Fair  Current Medications: Current Facility-Administered Medications  Medication Dose Route Frequency Provider Last Rate Last Dose  . acetaminophen (TYLENOL) tablet 650 mg  650 mg Oral Q6H PRN [name], MD   650 mg at 08/15/15 1952  . alum & mag hydroxide-simeth (MAALOX/MYLANTA) 200-200-20 MG/5ML suspension 30 mL  30 mL Oral Q4H PRN [name], MD   30 mL at 08/13/15 2107  . amLODipine (NORVASC) tablet 2.5 mg  2.5 mg Oral Daily [name], MD   2.5 mg at 08/22/15 1024  . benztropine (COGENTIN) tablet 1 mg  1 mg  Oral QHS [name], MD   1 mg at 08/21/15 2156  . docusate sodium (COLACE) capsule 200 mg  200 mg Oral BID [name], MD   200 mg at 08/22/15 1024  . fluPHENAZine (PROLIXIN) tablet 5 mg  5 mg Oral 3 times per day [name], MD   5 mg at 08/22/15 0649  . lactulose (CHRONULAC) 10 GM/15ML solution 30 g  30 g Oral TID [name], MD   30 g at 08/22/15 1024  . levothyroxine (SYNTHROID, LEVOTHROID) tablet 75 mcg  75 mcg Oral QAC breakfast [name], MD   75 mcg at 08/22/15 [phone]  . lurasidone (LATUDA) tablet 120 mg  120 mg Oral Q breakfast [name], MD   120  mg at 08/22/15 1024  . magnesium hydroxide (MILK OF MAGNESIA) suspension 30 mL  30 mL Oral Daily PRN [name], MD   30 mL at 08/09/15 2143  . milk and molasses enema  1 enema Rectal Once [name], MD   250 mL at 08/16/15 1230  . nicotine (NICODERM CQ - dosed in mg/24 hours) patch 21 mg  21 mg Transdermal Daily [name] [name], MD   21 mg at 08/22/15 1025  . Oxcarbazepine (TRILEPTAL) tablet 150 mg  150 mg Oral BID [name], MD   150 mg at 08/22/15 1025  . polyethylene glycol (MIRALAX / GLYCOLAX) packet 17 g  17 g Oral BID [name], MD   17 g at 08/22/15 1025  . senna (SENOKOT) tablet 17.2 mg  2 tablet Oral QHS [name], MD   17.2 mg at 08/21/15 2158  . traZODone (DESYREL) tablet 100 mg  100 mg Oral QHS [name], MD   100 mg at 08/21/15 2157  . venlafaxine XR (EFFEXOR-XR) 24 hr capsule 150 mg  150 mg Oral Q breakfast [name], MD   150 mg at 08/22/15 1025    Lab Results: No results found for this or any previous visit (from the past 48 hour(s)).  Blood Alcohol level:  Lab Results  Component Value Date   ETH <5 07/30/2015   ETH <5 07/13/2015    Physical Findings: AIMS: Facial and Oral Movements Muscles of Facial Expression: None, normal Lips and Perioral Area: None, normal Jaw: None, normal Tongue: None, normal,Extremity Movements Upper (arms, wrists, hands, fingers): None, normal Lower (legs, knees, ankles, toes): None, normal, Trunk Movements Neck, shoulders, hips: None, normal, Overall Severity Severity of abnormal movements (highest score from questions above): None, normal Incapacitation due to abnormal movements: None, normal Patient's awareness of abnormal movements (rate only patient's report): Aware, no distress, Dental Status Current problems with teeth and/or dentures?: No Does patient usually wear dentures?: No  CIWA:    COWS:     Musculoskeletal: Strength & Muscle Tone: within normal limits Gait & Station:  normal Patient leans: N/A  Psychiatric Specialty Exam: Review of Systems  Gastrointestinal: Positive for diarrhea.  All other systems reviewed and are negative.   Blood pressure 130/77, pulse 78, temperature 98.2 F (36.8 C), temperature source Oral, resp. rate 20, height 5\' 6"  (1.676 m), weight 86.183 kg (190 lb), SpO2 100 %.Body mass index is 30.68 kg/(m^2).  General Appearance: Casual  Eye Contact::  Good  Speech:  Clear and Coherent  Volume:  Normal  Mood:  Euthymic  Affect:  Blunt  Thought Process:  Goal Directed  Orientation:  Full (Time, Place, and Person)  Thought  Content:  WDL  Suicidal Thoughts:  No  Homicidal Thoughts:  No  Memory:  Immediate;   Fair Recent;   Fair Remote;   Fair  Judgement:  Impaired  Insight:  Shallow  Psychomotor Activity:  Normal  Concentration:  Fair  Recall:  [place]  Language: Fair  Akathisia:  No  Handed:  Right  AIMS (if indicated):     Assets:  Communication Skills Desire for Improvement Financial Resources/Insurance Housing Physical Health Resilience Social Support  ADL's:  Intact  Cognition: WNL  Sleep:  Number of Hours: 6.45   Treatment Plan Summary: Daily contact with patient to assess and evaluate symptoms and progress in treatment and Medication management   Melanie Bell is a 25 year old female with a history of schizoaffective disorder admitted for worsening of depression and suicide attempt by swallowing an AA battery, an earing and a screw.   1. Suicidal ideation. This has resolved. The patient is able to contract for safety. She is forward thinking and optimistic about the future.    2. Mood and psychosis. We continued Prolixin 5mg  po TID and Latuda 120mg  po daily for psychosis, Trileptal 150mg  po BID for mood stabilization, and Effexor XR 150mg  po daily for depression.   3. Hypertension. She is on amlodipine 2.5mg  po daily.  4. Constipation. She is on bowel regimen for constipation including  Colace and Miralax and bowels are moving.  5. Hypothyroidism. She is on Synthroid 55mcg po daily  6. Smoking. Nicotine patch was available.  7. Insomnia. She responded well to trazodone.  8. Social. She is an incompetent adult. Her Kara Dies is the guardian. She recently moved to [place].  9. Metabolic syndrome. Lipid panel, hemoglobin A1c, TSH, and PRL were checked during previous admission and were normal. Prolactin 15.   10. Foreign body ingestion. There are three foreign objects present initially in the colon on X-ray. She injested an AA battery on 3/15. She will continue bowel regimen and Lactuose. Input from GI and surgery is greatly appreciated. Surgery signed off. I will discontinue to discuss her case with Dr. [name], GI. There will be no surgical intervention unless there is perforation/peritonitis. Another X-ray was done yesterday showing progression. We will continue liquid diet and laxatives. GI consult is greatly appreciated.  11. Disposition. She will be discharged back to her group home and follow up with her regular psychiatrist at [facility] for medication management. She will continue in the day program.  No medication changes offered on 08/22/2015  Orson Slick, MD 08/22/2015, 2:18 PM

## 2015-08-22 NOTE — Plan of Care (Signed)
Problem: Consults Goal: Suicide Risk Patient Education (See Patient Education module for education specifics)  Outcome: Progressing Patient reports her goal is to cope with anxiety with healthy coping skills.

## 2015-08-22 NOTE — Progress Notes (Signed)
Patient with flat affect, cooperative behavior with meals, meds and plan of care. No SI/SH/HI at this time. Quiet and resting in bed this am. No distress, no c/o discomfort. Minimal interaction with peers. Patient states her goal today is to use positive coping skills to deal with her anxiety. One small soft brown stool monitored at this time, with no foreign body present. Safety maintained. MD orders DG abdominal imaging for today. Safety maintained.

## 2015-08-22 NOTE — B[facility] Group Notes (Signed)
[place] LCSW Group Therapy  08/22/2015 1:20 PM  Type of Therapy:  Group Therapy  Participation Level:  Active  Participation Quality:  Attentive  Affect:  Appropriate  Cognitive:  Alert  Insight:  Improving  Engagement in Therapy:  Improving  Modes of Intervention:  Discussion, Education, Socialization and Support  Summary of Progress/Problems: Self esteem: Patients discussed self esteem and how it impacts them. They discussed what aspects in their lives has influenced their self esteem. They were challenged to identify changes that are needed in order to improve self esteem. Melanie Bell attended group and stayed the entire time. She discussed dwelling on past events negatively impacts her self esteem. She reports she is going to start therapy and saying positive self affirmations.   [place] MSW, LCSWA  08/22/2015, 1:20 PM

## 2015-08-23 ENCOUNTER — Inpatient Hospital Stay: Payer: MEDICAID

## 2015-08-23 IMAGING — CR DG ABDOMEN 2V
1 series · 2 of 2 positions shown · non-contrast
Comparison: [DATE].

CLINICAL DATA: Swallowed to screw battery and he ring.

EXAM:
ABDOMEN - 2 VIEW

[Series 1: dg abd 2 views · 0.14mm/px · 2 of 2 slices shown]
[im 1/2]
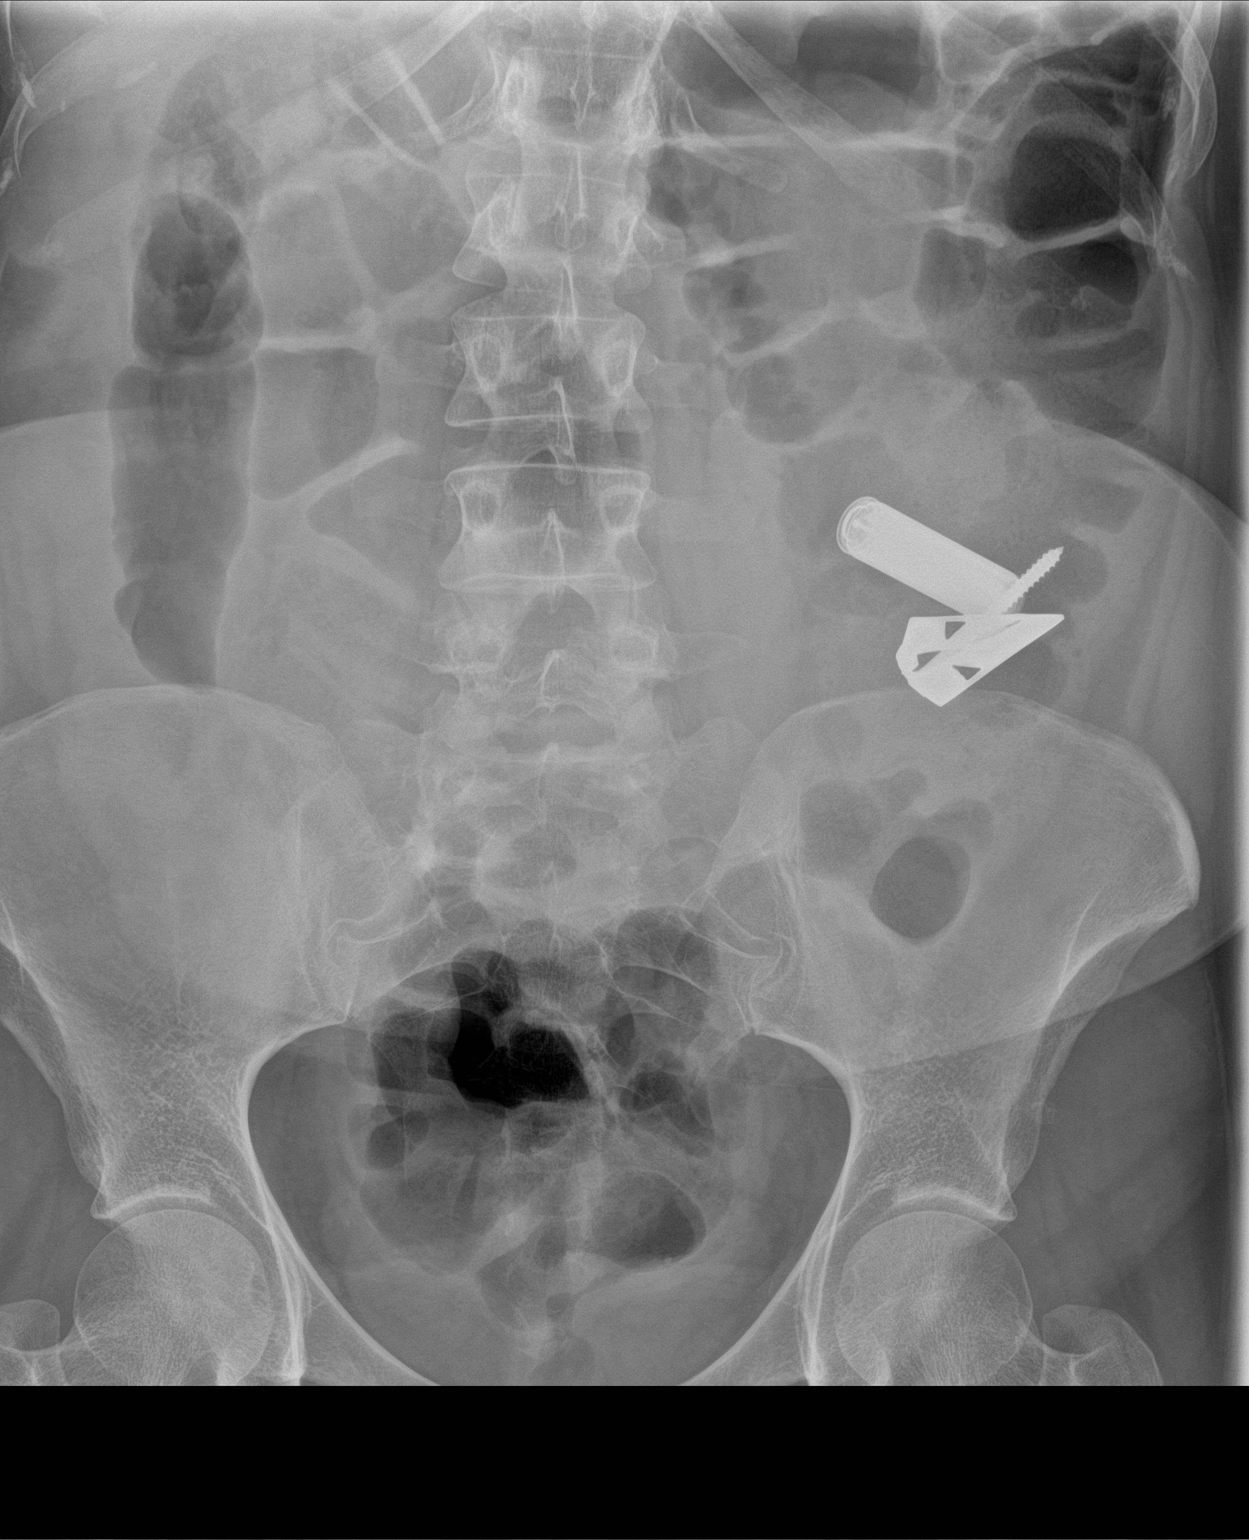
[im 2/2]
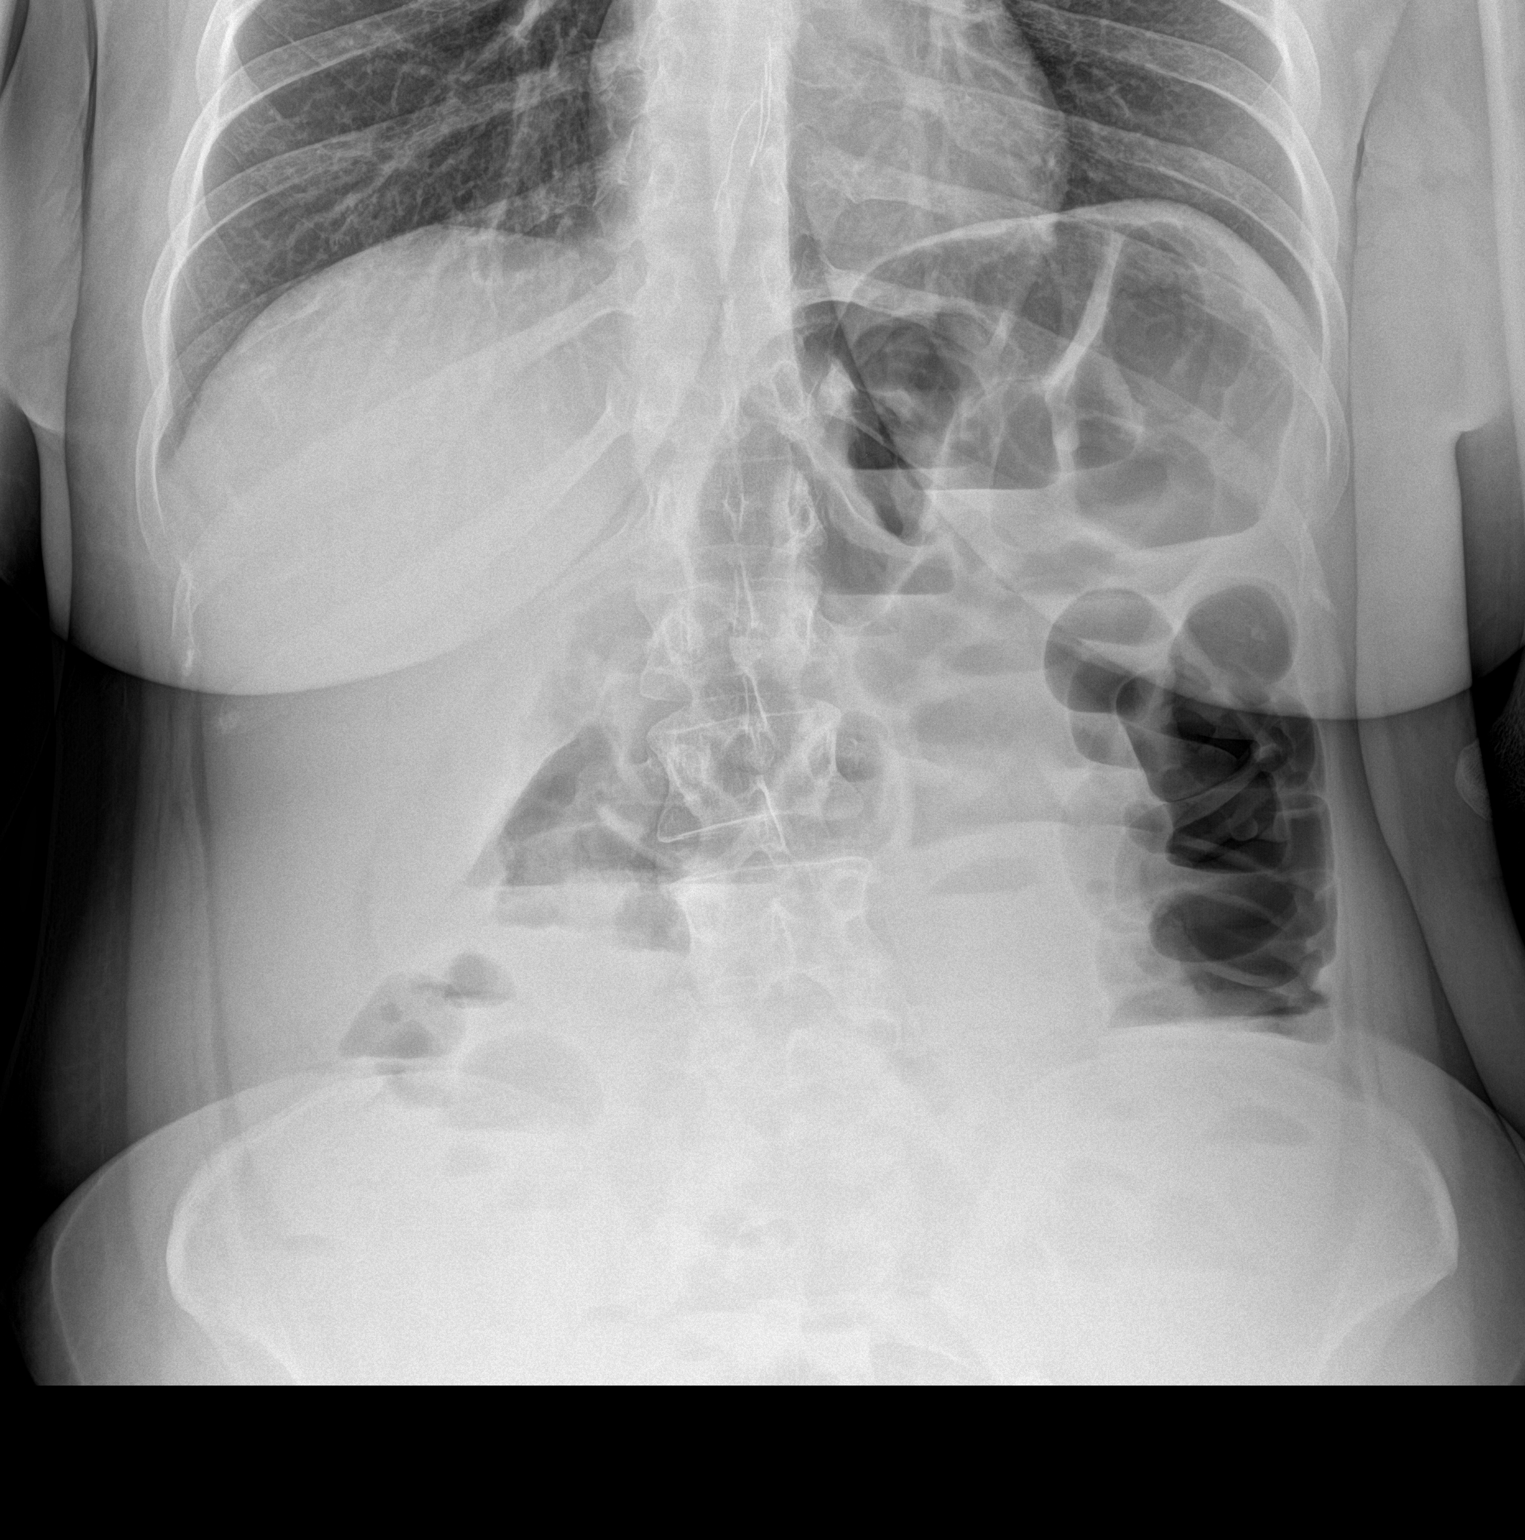

[2 of 2 positions shown; findings below may reference images not displayed]

FINDINGS: The he foreign bodies are identified within the left lower quadrant
of the abdomen. This is in the same location as the exam from
[DATE]. Gaseous distension of the small and large bowel loops
and noted as well as multiple fluid levels. The degree of bowel
distention is similar to previous exam.
IMPRESSION: 1. No significant change in position of metallic foreign bodies in
the left lower quadrant of the abdomen when compared with
[DATE].

## 2015-08-23 NOTE — Progress Notes (Addendum)
Patient with appropriate affect, cooperative behavior with meals, meds and plan of care. No SI/HI/SH  at this time. Remains quiet and withdrawn from peers. Therapy groups encouraged. Goal today is to stop isolating and stay out of bed. Patient does attend therapy group outside. Safety maintained. Abdominal xray performed. Patient with no distress or discomfort at this time. Abdominal x-ray shows foreign objects have not moved since last x-ray.

## 2015-08-23 NOTE — Plan of Care (Signed)
Problem: Alteration in thought process Goal: LTG-Patient verbalizes understanding importance med regimen (Patient verbalizes understanding of importance of medication regimen and need to continue outpatient care.)  Outcome: Progressing Cooperative with meds, goal today is to stop isolating.

## 2015-08-23 NOTE — B[facility] Group Notes (Signed)
[place] LCSW Group Therapy  08/23/2015 4:24 PM   Type of Therapy:  Group Therapy  Participation Level:  Did Not Attend  Modes of Intervention:  Discussion, Education, Socialization and Support  Summary of Progress/Problems:. Todays topic: Grudges  Patients will be encouraged to discuss their thoughts, feelings, and behaviors as to why one holds on to grudges and reasons why people have grudges. Patients will process the impact of grudges on their daily lives and identify thoughts and feelings related to holding grudges. Patients will identify feelings and thoughts related to what life would look like without grudges.   [place] MSW, [place]  08/23/2015, 4:25 PM

## 2015-08-23 NOTE — Progress Notes (Signed)
Patient denies SI/AVH. Patient pleasant and cooperative. Patient states that she is in good spirits and ready to go home. Patient states that she behaved impulsively when she swallowed the battery and will work on her coping skills. Patient did not pass any of the foreign objects during shift. Safety maintained.

## 2015-08-23 NOTE — Progress Notes (Signed)
Melanie Bell  08/23/2015 6:22 AM Melanie Bell  MRN:  [name]  Subjective:  [name] denies any symptoms of depression, anxiety, or psychosis. She is not suicidal or homicidal. She has bowel movements but has not passed foreign objects yet. X-ray of the abdomen today shows no forward progression. She accepts medications and tolerates them well. Sleep and appetite are okay. She still on liquid diet. Good group participation.  Principal Problem: Schizoaffective disorder, bipolar type ([place]) Diagnosis:   Patient Active Problem List   Diagnosis Date Noted  . Foreign body alimentary tract [T18.9XXA]   . Foreign body ingestion [T18.9XXA] 07/31/2015  . Schizoaffective disorder, bipolar type ([place]) [F25.0] 11/06/2014  . Tardive dyskinesia [G24.01] 10/06/2014  . Tobacco use disorder [F17.200] 09/30/2014  . Hypothyroidism [E03.9] 09/29/2014  . Hypertension [I10] 09/29/2014  . Constipation [K59.00] 09/29/2014   Total Time spent with patient: 20 minutes  Past Psychiatric History: Schizoaffective disorder.  Past Medical History:  Past Medical History  Diagnosis Date  . Hallucinations 09/30/2014    Sizoaffective  . Depression   . Anxiety   . Hypertension   . Tardive dyskinesia 10/2014    recent onset  . GERD (gastroesophageal reflux disease)   . Hyperlipidemia     Past Surgical History  Procedure Laterality Date  . Wisdom tooth extraction    . Esophagogastroduodenoscopy N/A 11/28/2014    Procedure: ESOPHAGOGASTRODUODENOSCOPY (EGD);  Surgeon: [name], MD;  Location: [facility] ENDOSCOPY;  Service: Endoscopy;  Laterality: N/A;  . Abdominal surgery      "years ago" to remove foreign objects  . Breast lumpectomy     Family History:  Family History  Problem Relation Age of Onset  . Depression Mother   . Hypertension Mother    Family Psychiatric  History: Mother with depression. Social History:  History  Alcohol Use No     History  Drug Use No    Social History    Social History  . Marital Status: Single    Spouse Name: N/A  . Number of Children: N/A  . Years of Education: N/A   Social History Main Topics  . Smoking status: Current Every Day Smoker -- 0.50 packs/day for 3 years    Types: Cigarettes  . Smokeless tobacco: Never Used  . Alcohol Use: No  . Drug Use: No  . Sexual Activity: No   Other Topics Concern  . None   Social History Narrative   Additional Social History:    Pain Medications: See PTA Prescriptions: See PTA Over the Counter: See PTA History of alcohol / drug use?: No history of alcohol / drug abuse Longest period of sobriety (when/how long): No history of use                    Sleep: Fair  Appetite:  Fair  Current Medications: Current Facility-Administered Medications  Medication Dose Route Frequency Provider Last Rate Last Dose  . acetaminophen (TYLENOL) tablet 650 mg  650 mg Oral Q6H PRN [name], MD   650 mg at 08/15/15 1952  . alum & mag hydroxide-simeth (MAALOX/MYLANTA) 200-200-20 MG/5ML suspension 30 mL  30 mL Oral Q4H PRN [name], MD   30 mL at 08/13/15 2107  . amLODipine (NORVASC) tablet 2.5 mg  2.5 mg Oral Daily [name], MD   2.5 mg at 08/22/15 1024  . benztropine (COGENTIN) tablet 1 mg  1 mg Oral QHS [name], MD   1 mg at  08/22/15 2128  . docusate sodium (COLACE) capsule 200 mg  200 mg Oral BID [name], MD   200 mg at 08/22/15 2128  . fluPHENAZine (PROLIXIN) tablet 5 mg  5 mg Oral 3 times per day [name], MD   5 mg at 08/22/15 2128  . lactulose (CHRONULAC) 10 GM/15ML solution 30 g  30 g Oral TID [name], MD   30 g at 08/22/15 2128  . levothyroxine (SYNTHROID, LEVOTHROID) tablet 75 mcg  75 mcg Oral QAC breakfast [name], MD   75 mcg at 08/22/15 [phone]  . lurasidone (LATUDA) tablet 120 mg  120 mg Oral Q breakfast [name], MD   120 mg at 08/22/15 1024  . magnesium hydroxide (MILK OF MAGNESIA) suspension 30 mL  30 mL Oral Daily PRN  [name], MD   30 mL at 08/09/15 2143  . milk and molasses enema  1 enema Rectal Once [name], MD   250 mL at 08/16/15 1230  . nicotine (NICODERM CQ - dosed in mg/24 hours) patch 21 mg  21 mg Transdermal Daily [name] [name], MD   21 mg at 08/22/15 1025  . Oxcarbazepine (TRILEPTAL) tablet 150 mg  150 mg Oral BID [name], MD   150 mg at 08/22/15 2128  . polyethylene glycol (MIRALAX / GLYCOLAX) packet 17 g  17 g Oral BID [name], MD   17 g at 08/22/15 2142  . senna (SENOKOT) tablet 17.2 mg  2 tablet Oral QHS [name], MD   17.2 mg at 08/22/15 2142  . traZODone (DESYREL) tablet 100 mg  100 mg Oral QHS [name], MD   100 mg at 08/22/15 2142  . venlafaxine XR (EFFEXOR-XR) 24 hr capsule 150 mg  150 mg Oral Q breakfast [name], MD   150 mg at 08/22/15 1025    Lab Results: No results found for this or any previous visit (from the past 48 hour(s)).  Blood Alcohol level:  Lab Results  Component Value Date   ETH <5 07/30/2015   ETH <5 07/13/2015    Physical Findings: AIMS: Facial and Oral Movements Muscles of Facial Expression: None, normal Lips and Perioral Area: None, normal Jaw: None, normal Tongue: None, normal,Extremity Movements Upper (arms, wrists, hands, fingers): None, normal Lower (legs, knees, ankles, toes): None, normal, Trunk Movements Neck, shoulders, hips: None, normal, Overall Severity Severity of abnormal movements (highest score from questions above): None, normal Incapacitation due to abnormal movements: None, normal Patient's awareness of abnormal movements (rate only patient's report): Aware, no distress, Dental Status Current problems with teeth and/or dentures?: No Does patient usually wear dentures?: No  CIWA:    COWS:     Musculoskeletal: Strength & Muscle Tone: within normal limits Gait & Station: normal Patient leans: N/A  Psychiatric Specialty Exam: Review of Systems  Gastrointestinal: Positive for  diarrhea.  All other systems reviewed and are negative.   Blood pressure 130/77, pulse 78, temperature 98.2 F (36.8 C), temperature source Oral, resp. rate 20, height 5\' 6"  (1.676 m), weight 86.183 kg (190 lb), SpO2 100 %.Body mass index is 30.68 kg/(m^2).  General Appearance: Casual  Eye Contact::  Good  Speech:  Clear and Coherent  Volume:  Normal  Mood:  Euthymic  Affect:  Appropriate  Thought Process:  Goal Directed  Orientation:  Full (Time, Place, and Person)  Thought Content:  WDL  Suicidal Thoughts:  No  Homicidal Thoughts:  No  Memory:  Immediate;   Fair Recent;   Fair Remote;   Fair  Judgement:  Impaired  Insight:  Shallow  Psychomotor Activity:  Normal  Concentration:  Fair  Recall:  [place]  Language: Fair  Akathisia:  No  Handed:  Right  AIMS (if indicated):     Assets:  Communication Skills Desire for Improvement Financial Resources/Insurance Housing Physical Health Resilience Social Support  ADL's:  Intact  Cognition: WNL  Sleep:  Number of Hours: 6.45   Treatment Plan Summary: Daily contact with patient to assess and evaluate symptoms and progress in treatment and Medication management   [name] is a 25 year old female with a history of schizoaffective disorder admitted for worsening of depression and suicide attempt by swallowing an AA battery, an earing and a screw.   1. Suicidal ideation. This has resolved. The patient is able to contract for safety. She is forward thinking and optimistic about the future.    2. Mood and psychosis. We continued Prolixin 5mg  po TID and Latuda 120mg  po daily for psychosis, Trileptal 150mg  po BID for mood stabilization, and Effexor XR 150mg  po daily for depression.   3. Hypertension. She is on amlodipine 2.5mg  po daily.  4. Constipation. She is on bowel regimen for constipation including Colace and Miralax and bowels are moving.  5. Hypothyroidism. She is on Synthroid 12mcg po  daily  6. Smoking. Nicotine patch was available.  7. Insomnia. She responded well to trazodone.  8. Social. She is an incompetent adult. Her Melanie Bell is the guardian. She recently moved to [place].  9. Metabolic syndrome. Lipid panel, hemoglobin A1c, TSH, and PRL were checked during previous admission and were normal. Prolactin 15.   10. Foreign body ingestion. There are three foreign objects present initially in the colon on X-ray. She injested an AA battery on 3/15. She will continue bowel regimen and Lactuose. Input from GI and surgery is greatly appreciated. Surgery signed off. I will discontinue to discuss her case with Dr. [name], GI. There will be no surgical intervention unless there is perforation/peritonitis. Another X-ray was done yesterday showing progression. We will continue liquid diet and laxatives. GI consult is greatly appreciated. X-Ray reviewed on 08/23/2015.  11. Disposition. She will be discharged back to her group home and follow up with her regular psychiatrist at [facility] for medication management. She will continue in the day program.     Orson Slick, MD 08/23/2015, 6:22 AM

## 2015-08-23 NOTE — BHH Group Notes (Signed)
[facility] Group Notes:  (Nursing/MHT/Case Management/Adjunct)  Date:  08/23/2015  Time:  9:01 AM  Type of Therapy:  Goal setting   Participation Level:  Did Not Attend  Charise Killian 08/23/2015, 9:01 AM

## 2015-08-24 NOTE — BHH Group Notes (Signed)
[place] Group Notes:  (Nursing/MHT/Case Management/Adjunct)  Date:  08/24/2015  Time:  10:02 PM  Type of Therapy:  Evening Wrap-up Group  Participation Level:  Active  Participation Quality:  Appropriate and Attentive  Affect:  Appropriate  Cognitive:  Alert and Appropriate  Insight:  Appropriate and Good  Engagement in Group:  Engaged  Modes of Intervention:  Discussion  Summary of Progress/Problems: Patient states she is doing well and her goal was to continue exercising her coping skills.   Jai Steil Nanta Chetan Mehring 08/24/2015, 10:02 PM

## 2015-08-24 NOTE — B[facility] Group Notes (Signed)
[facility] Group Notes:  (Nursing/MHT/Case Management/Adjunct)  Date:  08/24/2015  Time:  3:10 AM  Type of Therapy:  Group Therapy  Participation Level:  Active  Participation Quality:  Appropriate  Affect:  Appropriate  Cognitive:  Appropriate  Insight:  Appropriate  Engagement in Group:  Engaged  Modes of Intervention:  n/a  Summary of Progress/Problems:  Melanie Bell 08/24/2015, 3:10 AM

## 2015-08-24 NOTE — Progress Notes (Signed)
[facility] MD Progress Note  08/24/2015 12:19 PM ELLYSA LENIUS  MRN:  [name]  Subjective: [name] has no complaints. She is taking medications as prescribed. She moves her bowels but did not expel foreign objects yet. Yesterday's x-ray was unremarkable except for the presence of foreign pieces. We'll continue to monitor.  Principal Problem: Schizoaffective disorder, bipolar type ([place]) Diagnosis:   Patient Active Problem List   Diagnosis Date Noted  . Foreign body alimentary tract [T18.9XXA]   . Foreign body ingestion [T18.9XXA] 07/31/2015  . Schizoaffective disorder, bipolar type ([place]) [F25.0] 11/06/2014  . Tardive dyskinesia [G24.01] 10/06/2014  . Tobacco use disorder [F17.200] 09/30/2014  . Hypothyroidism [E03.9] 09/29/2014  . Hypertension [I10] 09/29/2014  . Constipation [K59.00] 09/29/2014   Total Time spent with patient: 20 minutes  Past Psychiatric History: Schizoaffective disorder.  Past Medical History:  Past Medical History  Diagnosis Date  . Hallucinations 09/30/2014    Sizoaffective  . Depression   . Anxiety   . Hypertension   . Tardive dyskinesia 10/2014    recent onset  . GERD (gastroesophageal reflux disease)   . Hyperlipidemia     Past Surgical History  Procedure Laterality Date  . Wisdom tooth extraction    . Esophagogastroduodenoscopy N/A 11/28/2014    Procedure: ESOPHAGOGASTRODUODENOSCOPY (EGD);  Surgeon: [name], MD;  Location: [facility] ENDOSCOPY;  Service: Endoscopy;  Laterality: N/A;  . Abdominal surgery      "years ago" to remove foreign objects  . Breast lumpectomy     Family History:  Family History  Problem Relation Age of Onset  . Depression Mother   . Hypertension Mother    Family Psychiatric  History: Mother with depression. Social History:  History  Alcohol Use No     History  Drug Use No    Social History   Social History  . Marital Status: Single    Spouse Name: N/A  . Number of Children: N/A  . Years of  Education: N/A   Social History Main Topics  . Smoking status: Current Every Day Smoker -- 0.50 packs/day for 3 years    Types: Cigarettes  . Smokeless tobacco: Never Used  . Alcohol Use: No  . Drug Use: No  . Sexual Activity: No   Other Topics Concern  . None   Social History Narrative   Additional Social History:    Pain Medications: See PTA Prescriptions: See PTA Over the Counter: See PTA History of alcohol / drug use?: No history of alcohol / drug abuse Longest period of sobriety (when/how long): No history of use                    Sleep: Fair  Appetite:  Fair  Current Medications: Current Facility-Administered Medications  Medication Dose Route Frequency Provider Last Rate Last Dose  . acetaminophen (TYLENOL) tablet 650 mg  650 mg Oral Q6H PRN [name], MD   650 mg at 08/15/15 1952  . alum & mag hydroxide-simeth (MAALOX/MYLANTA) 200-200-20 MG/5ML suspension 30 mL  30 mL Oral Q4H PRN [name], MD   30 mL at 08/13/15 2107  . amLODipine (NORVASC) tablet 2.5 mg  2.5 mg Oral Daily [name], MD   2.5 mg at 08/24/15 0844  . benztropine (COGENTIN) tablet 1 mg  1 mg Oral QHS [name], MD   1 mg at 08/23/15 2141  . docusate sodium (COLACE) capsule 200 mg  200 mg Oral BID [name], MD  200 mg at 08/24/15 0845  . fluPHENAZine (PROLIXIN) tablet 5 mg  5 mg Oral 3 times per day [name], MD   5 mg at 08/24/15 0644  . lactulose (CHRONULAC) 10 GM/15ML solution 30 g  30 g Oral TID [name], MD   30 g at 08/24/15 0845  . levothyroxine (SYNTHROID, LEVOTHROID) tablet 75 mcg  75 mcg Oral QAC breakfast [name], MD   75 mcg at 08/24/15 0644  . lurasidone (LATUDA) tablet 120 mg  120 mg Oral Q breakfast [name], MD   120 mg at 08/24/15 0844  . magnesium hydroxide (MILK OF MAGNESIA) suspension 30 mL  30 mL Oral Daily PRN [name], MD   30 mL at 08/09/15 2143  . milk and molasses enema  1 enema Rectal Once [name], MD   250 mL at 08/16/15 1230  . nicotine (NICODERM CQ - dosed in mg/24 hours) patch 21 mg  21 mg Transdermal Daily Nakari Bracknell B Iyari Hagner, MD   21 mg at 08/24/15 0848  . Oxcarbazepine (TRILEPTAL) tablet 150 mg  150 mg Oral BID [name], MD   150 mg at 08/24/15 0845  . polyethylene glycol (MIRALAX / GLYCOLAX) packet 17 g  17 g Oral BID [name], MD   17 g at 08/24/15 0845  . senna (SENOKOT) tablet 17.2 mg  2 tablet Oral QHS [name], MD   17.2 mg at 08/23/15 2141  . traZODone (DESYREL) tablet 100 mg  100 mg Oral QHS [name], MD   100 mg at 08/23/15 2141  . venlafaxine XR (EFFEXOR-XR) 24 hr capsule 150 mg  150 mg Oral Q breakfast [name], MD   150 mg at 08/24/15 0844    Lab Results: No results found for this or any previous visit (from the past 48 hour(s)).  Blood Alcohol level:  Lab Results  Component Value Date   ETH <5 07/30/2015   ETH <5 07/13/2015    Physical Findings: AIMS: Facial and Oral Movements Muscles of Facial Expression: None, normal Lips and Perioral Area: None, normal Jaw: None, normal Tongue: None, normal,Extremity Movements Upper (arms, wrists, hands, fingers): None, normal Lower (legs, knees, ankles, toes): None, normal, Trunk Movements Neck, shoulders, hips: None, normal, Overall Severity Severity of abnormal movements (highest score from questions above): None, normal Incapacitation due to abnormal movements: None, normal Patient's awareness of abnormal movements (rate only patient's report): Aware, no distress, Dental Status Current problems with teeth and/or dentures?: No Does patient usually wear dentures?: No  CIWA:    COWS:     Musculoskeletal: Strength & Muscle Tone: within normal limits Gait & Station: normal Patient leans: N/A  Psychiatric Specialty Exam: Review of Systems  Gastrointestinal: Positive for diarrhea.  All other systems reviewed and are negative.   Blood pressure 110/69, pulse 85, temperature  99 F (37.2 C), temperature source Oral, resp. rate 18, height 5\' 6"  (1.676 m), weight 86.183 kg (190 lb), SpO2 100 %.Body mass index is 30.68 kg/(m^2).  General Appearance: Casual  Eye Contact::  Good  Speech:  Clear and Coherent  Volume:  Normal  Mood:  Euthymic  Affect:  Appropriate  Thought Process:  Goal Directed  Orientation:  Full (Time, Place, and Person)  Thought Content:  WDL  Suicidal Thoughts:  No  Homicidal Thoughts:  No  Memory:  Immediate;   Fair Recent;   Fair Remote;   Fair  Judgement:  Impairedjudgment  Insight:  Lacking  Psychomotor Activity:  Normal  Concentration:  Fair  Recall:  [place]  Language: Fair  Akathisia:  No  Handed:  Right  AIMS (if indicated):     Assets:  Communication Skills Desire for Improvement Financial Resources/Insurance Housing Physical Health Resilience Social Support  ADL's:  Intact  Cognition: WNL  Sleep:  Number of Hours: 6.75   Treatment Plan Summary: Daily contact with patient to assess and evaluate symptoms and progress in treatment and Medication management   [name] is a 25 year old female with a history of schizoaffective disorder admitted for worsening of depression and suicide attempt by swallowing an AA battery, an earing and a screw.   1. Suicidal ideation. This has resolved. The patient is able to contract for safety. She is forward thinking and optimistic about the future.    2. Mood and psychosis. We continued Prolixin 5mg  po TID and Latuda 120mg  po daily for psychosis, Trileptal 150mg  po BID for mood stabilization, and Effexor XR 150mg  po daily for depression.   3. Hypertension. She is on amlodipine 2.5mg  po daily.  4. Constipation. She is on bowel regimen for constipation including Colace and Miralax and bowels are moving.  5. Hypothyroidism. She is on Synthroid 36mcg po daily  6. Smoking. Nicotine patch was available.  7. Insomnia. She responded well to trazodone.  8.  Social. She is an incompetent adult. Her Kara Dies is the guardian. She recently moved to [place].  9. Metabolic syndrome. Lipid panel, hemoglobin A1c, TSH, and PRL were checked during previous admission and were normal. Prolactin 15.   10. Foreign body ingestion. There are three foreign objects present on X-ray. She injested an AA battery on 3/15. She will continue bowel regimen and Lactuose. Input from GI and surgery is greatly appreciated. I will discontinue to discuss her case with Dr. [name], GI. There will be no surgical intervention unless there is perforation/peritonitis. Surgery signed off. Another X-ray was done on 08/23/2015. We will continue liquid diet and laxatives.   11. Disposition. She will be discharged back to her group home and follow up with her regular psychiatrist at [facility] for medication management. She will continue in the day program.    Orson Slick, MD 08/24/2015, 12:19 PM

## 2015-08-24 NOTE — Progress Notes (Signed)
Pt had BM, nothing found upon inspection.

## 2015-08-24 NOTE — Progress Notes (Signed)
D:  Per pt self inventory pt reports sleeping good, appetite normal, energy level normal, ability to pay attention good, rates depression at a 1 out of 10, hopelessness at a 0 out of 10, anxiety at a 1 out of 10, denies SI/HI/AVH, goal today: "coping skills, practice them in my free time", flat, pleasant during interaction, pt had 1 liquid BM this afternoon, nothing found in stool upon inspection, MD aware.     A:  Emotional support provided, Encouraged pt to continue with treatment plan and attend all group activities, q15 min checks maintained for safety.  R:  Pt is receptive, going to groups, pleasant and cooperative with staff and other patients on the unit.

## 2015-08-24 NOTE — Consult Note (Signed)
Subjective: Patient seen for foreign body ingestion. Patietn denies n/v or abdominal pain. No passage of foreign bodies yet.   Objective: Vital signs in last 24 hours: Temp:  [99 F (37.2 C)] 99 F (37.2 C) (04/10 0700) Pulse Rate:  [85] 85 (04/10 0700) BP: (110)/(69) 110/69 mmHg (04/10 0700) Blood pressure 110/69, pulse 85, temperature 99 F (37.2 C), temperature source Oral, resp. rate 18, height 5\' 6"  (1.676 m), weight 86.183 kg (190 lb), SpO2 100 %.   Intake/Output from previous day: 2022/09/17 0701 - 04/10 0700 In: 600 [P.O.:600] Out: -   Intake/Output this shift: Total I/O In: 800 [P.O.:800] Out: -    General appearance:  Well appearing, no distress Resp:  cta Cardio:  rrr GI:  Soft, nd/nt bowel sounds positive normoactive.  Extremities:  No cce   Lab Results: No results found for this or any previous visit (from the past 24 hour(s)).   No results for input(s): WBC, HGB, HCT, PLT in the last 72 hours. BMET No results for input(s): NA, K, CL, CO2, GLUCOSE, BUN, CREATININE, CALCIUM in the last 72 hours. LFT No results for input(s): PROT, ALBUMIN, AST, ALT, ALKPHOS, BILITOT, BILIDIR, IBILI in the last 72 hours. PT/INR No results for input(s): LABPROT, INR in the last 72 hours. Hepatitis Panel No results for input(s): HEPBSAG, HCVAB, HEPAIGM, HEPBIGM in the last 72 hours. C-Diff No results for input(s): CDIFFTOX in the last 72 hours. No results for input(s): CDIFFPCR in the last 72 hours.   Studies/Results: Dg Abd 2 Views  Sep 17, 2015  CLINICAL DATA:  Swallowed to screw battery and he ring. EXAM: ABDOMEN - 2 VIEW COMPARISON:  08/21/2015. FINDINGS: The he foreign bodies are identified within the left lower quadrant of the abdomen. This is in the same location as the exam from 08/21/2015. Gaseous distension of the small and large bowel loops and noted as well as multiple fluid levels. The degree of bowel distention is similar to previous exam. IMPRESSION: 1. No significant  change in position of metallic foreign bodies in the left lower quadrant of the abdomen when compared with 08/21/2015. Electronically Signed   By: Kerby Moors M.D.   On: 09-17-15 11:06    Scheduled Inpatient Medications:   . amLODipine  2.5 mg Oral Daily  . benztropine  1 mg Oral QHS  . docusate sodium  200 mg Oral BID  . fluPHENAZine  5 mg Oral 3 times per day  . lactulose  30 g Oral TID  . levothyroxine  75 mcg Oral QAC breakfast  . lurasidone  120 mg Oral Q breakfast  . milk and molasses  1 enema Rectal Once  . nicotine  21 mg Transdermal Daily  . Oxcarbazepine  150 mg Oral BID  . polyethylene glycol  17 g Oral BID  . senna  2 tablet Oral QHS  . traZODone  100 mg Oral QHS  . venlafaxine XR  150 mg Oral Q breakfast    Continuous Inpatient Infusions:     PRN Inpatient Medications:  acetaminophen, alum & mag hydroxide-simeth, magnesium hydroxide  Miscellaneous:   Assessment:  1) multiple foreign body ingestion.  Films yesterday showd no change.    Plan:  1) will repeat 2 way film tomorrow.  Discussed with Dr Bary Leriche.  Patient currently represents low risk for perforation if she spontaneously passes  The foreign bodies.  Sedated procedure and removal of objects currently rperesents a higher risk.   Following.   [name] MD 08/24/2015, 3:22 PM

## 2015-08-24 NOTE — [facility] Group Notes (Signed)
[facility] LCSW Group Therapy  08/24/2015 4:28 PM  Type of Therapy:  Group Therapy  Participation Level:  Did Not Attend    [name], MSW, LCSW 08/24/2015, 4:28 PM

## 2015-08-24 NOTE — Progress Notes (Signed)
D: Observed pt in dayroom interacting with peers. Patient alert and oriented x4. Patient denies SI/HI/AVH. Pt affect appropriate. Pt rated depression 3/10 and anxiety 5/10. Pt stated her mood is "good." Pt indicated that her depression and anxiety are related to not being discharged and having the foreign object still in her. Pt had 2 bowel movements, brown, loose and watery. No foreign objects were found. A: Offered active listening and support. Provided therapeutic communication. Administered scheduled medications. Checked pt stool. R: Pt pleasant and cooperative. Pt medication compliant. Will continue Q15 min. checks. Safety maintained.

## 2015-08-24 NOTE — Plan of Care (Signed)
Problem: Alteration in thought process Goal: LTG-Patient verbalizes understanding importance med regimen (Patient verbalizes understanding of importance of medication regimen and need to continue outpatient care.)  Outcome: Progressing Pt understands importance of medication regimen.

## 2015-08-24 NOTE — Progress Notes (Signed)
Recreation Therapy Notes  Date: 04.10.17 Time: 1:00 pm Location: Craft Room  Group Topic: Wellness  Goal Area(s) Addresses:  Patient will identify at least one item per dimension of health. Patient will examine areas they are deficient in.  Behavioral Response: Did not attend  Intervention: 6 Dimensions of Health  Activity: Patients were given a definition sheet with the 6 Dimensions of Health on it. Patients were given a worksheet with the 6 Dimensions of Health on it and were encouraged to think of 2-3 things they are currently doing in each dimension.  Education: LRT educated patients on ways they can increase each dimension.  Education Outcome: Patient did not attend group.   Clinical Observations/Feedback: Patient did not attend group.  Leonette Monarch, LRT/CTRS 08/24/2015 2:17 PM

## 2015-08-25 NOTE — Progress Notes (Signed)
D: Pt is seen in the milieu this evening interacting appropriately with staff and peers. She denies SI/HI/AVH at this time. Denies pain. Pt rates her anxiety as a 3 out of 10 and depression 1 out of 10. She reports that her goal today was "to use my coping skills. I've been drawing, talking to people, and reading my bible." Pt has had 2 BMs this shift. No foreign objects found. A: Emotional support and encouragement provided. Medications administered with education. q15 minute safety checks maintained. R: Pt remains free from harm. Will continue to monitor.

## 2015-08-25 NOTE — Progress Notes (Signed)
Patent is pleasant & cooperative.Denies depression & suicidal ideations.Appropriate with staff & peers.Patinent had 2 loose BM today,nothing found in stool upon inspection.Compliant with medications.Attended groups.

## 2015-08-25 NOTE — B[facility] Group Notes (Signed)
Adult Psychoeducational Group Note  Date:  08/25/2015 Time:  2:28 PM  Group Topic/Focus:  Recovery Goals:   The focus of this group is to identify appropriate goals for recovery and establish a plan to achieve them.  Participation Level:  Active  Participation Quality:  Appropriate  Affect:  Appropriate  Cognitive:  Appropriate  Insight: Appropriate  Engagement in Group:  Engaged  Modes of Intervention:  Education  Additional Comments:  Patient stated once she is discharged she will follow her ACT team.  She attend her appointments and work on her coping skills.    Melanie Bell 08/25/2015, 2:28 PM
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
## 2015-08-25 NOTE — Plan of Care (Signed)
Problem: Alteration in thought process Goal: LTG-Patient verbalizes understanding importance med regimen (Patient verbalizes understanding of importance of medication regimen and need to continue outpatient care.)  Outcome: Progressing Pt is able to identify each medication and what it is prescribed for.  Problem: Diagnosis: Increased Risk For Suicide Attempt Goal: LTG-Patient Will Report Improved Mood and Deny Suicidal LTG (by discharge) Patient will report improved mood and deny suicidal ideation.  Outcome: Progressing Pt denies SI at this time. She rates depression as a 1 out of 10.

## 2015-08-26 ENCOUNTER — Inpatient Hospital Stay: Payer: MEDICAID
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
## 2015-08-26 IMAGING — CR DG ABDOMEN 1V
1 series · 2 of 2 positions shown · non-contrast
Comparison: [DATE]; [DATE]; [DATE]

CLINICAL DATA: Evaluate for foreign body

EXAM:
ABDOMEN - 1 VIEW

[Series 7: x abdomen supine · 0.14mm/px · 2 of 2 slices shown]
[im 1/2]
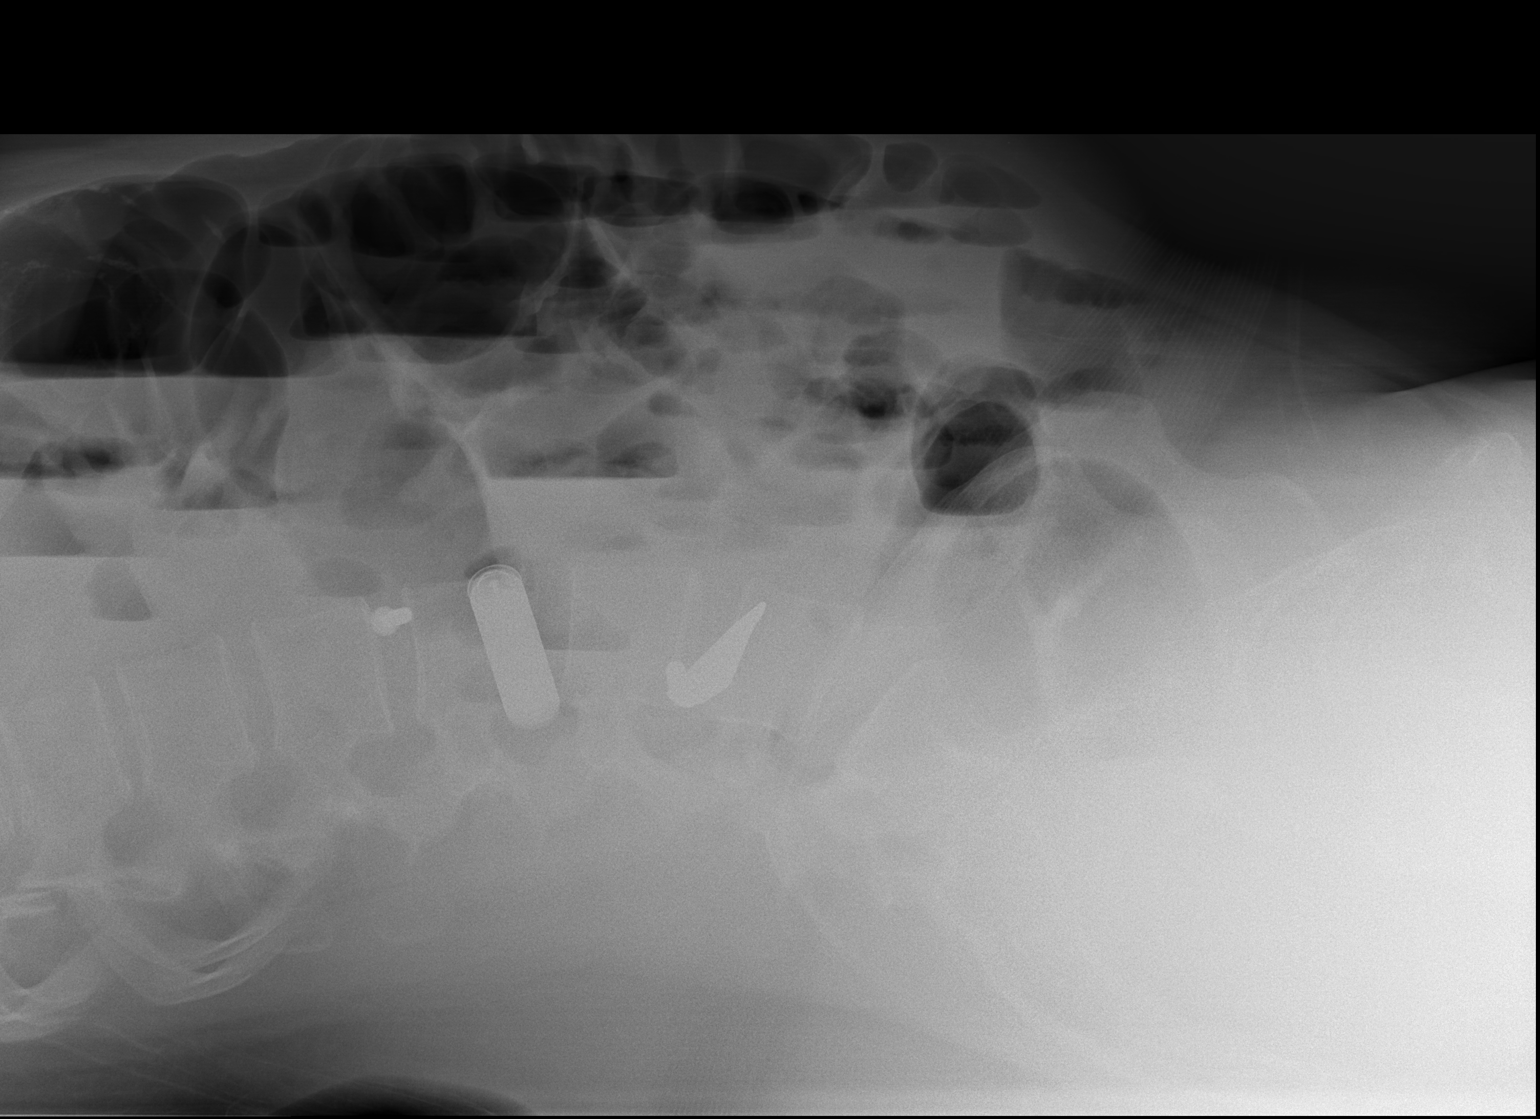
[im 2/2]
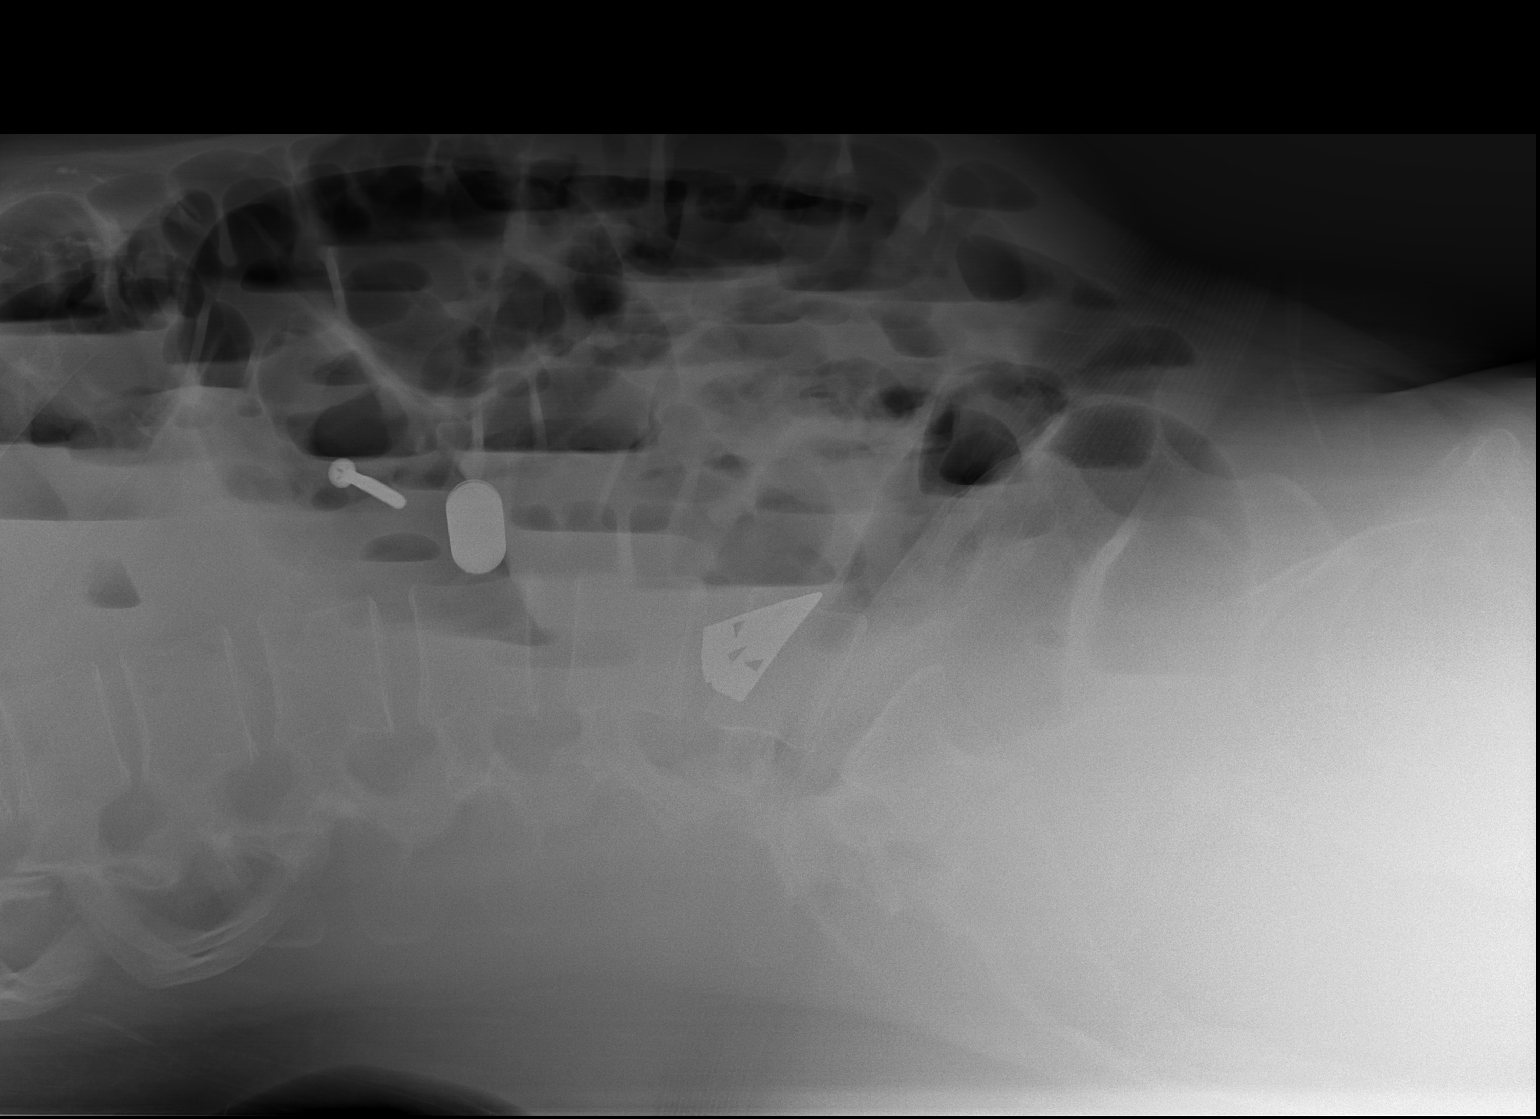

[2 of 2 positions shown; findings below may reference images not displayed]

FINDINGS: When correlated with AP abdominal radiographs performed earlier same
day, known ingested radiopaque foreign bodies project over the
expected location of the descending colon. Several apparent
air-fluid levels are noted on the provided lateral radiograph. No
pneumoperitoneum.
IMPRESSION: 1. When correlated with AP abdominal radiographs performed earlier
same day, known ingested radiopaque foreign bodies project over the
expected location of the descending colon though are technically
indeterminate on this examination. Further evaluation could be
performed with abdominal CT as clinically indicated.
2. No evidence of pneumoperitoneum.
3. Several air-fluid levels are noted on the provided lateral
radiograph, nonspecific though could be suggestive of developing
enteric obstruction. Clinical correlation is advised.

## 2015-08-26 NOTE — Plan of Care (Signed)
Problem: Alteration in thought process Goal: STG-Patient is able to discuss thoughts with staff Outcome: Progressing Pt discussing thoughts with staff.

## 2015-08-26 NOTE — Tx Team (Signed)
Interdisciplinary Treatment Plan Update (Adult)  Date:  08/26/2015 Time Reviewed:  3:40 PM  Progress in Treatment: Attending groups: Yes. Participating in groups:  Yes. Taking medication as prescribed:  Yes. Tolerating medication:  Yes. Family/Significant othe contact made:  Yes, individual(s) contacted:  mother and group home Patient understands diagnosis:  Yes. Discussing patient identified problems/goals with staff:  Yes. Medical problems stabilized or resolved:  No. and As evidenced by:  Pt has not passed the battery yet.  Denies suicidal/homicidal ideation: Yes. Issues/concerns per patient self-inventory:  Yes. Other:  New problem(s) identified: No, Describe:  NA  Discharge Plan or Barriers: Pt plans to return home and follow up with outpatient.    Reason for Continuation of Hospitalization: Hallucinations Medication stabilization Other; describe Pt swallowed objects  Comments: Melanie Bell is a 25 year old female with a history of schizoaffective disorder. She was briefly admitted to medical floor after she swallowed 3 foreign objects including a screw, AA battery and an earring. She was transferred to psychiatry before she passed them. The battery has been there since March 15. Apparently about that he should not stick around the gap for longer than a week according to GI note. She is now being followed by Dr. [name], GI. Surgery refuses to operate until there is perforation and/or peritonitis. The patient is no longer in need of psychiatric care but I could not discharge her to home given her medical condition. She is on liquid diet and laxatives.  Melanie Bell denies any symptoms of depression, anxiety, or psychosis. She denies any abdominal pain. In spite over multiple laxatives and lactulose she has not had a bowel movement yesterday. We will give mag citrate again today as her x-ray indicates massive amounts of stool in the bowels. She accepts medications and tolerates them  well. She participates well in programming.   Estimated length of stay: up to 2 days, expected discharge by Thursday 08/27/15  New goal(s): NA  Review of initial/current patient goals per problem list:   1.  Goal(s): Patient will participate in aftercare plan * Met: Yes * Target date: at discharge * As evidenced by: Patient will participate within aftercare plan AEB aftercare provider and housing plan at discharge being identified. 08/18/15: Patient has a provider and will be referred to [facility] and has a group home which she can return to at discharge. Goal met.    2.  Goal (s): Patient will exhibit decreased depressive symptoms and suicidal ideations. * Met: Yes *  Target date: at discharge * As evidenced by: Patient will utilize self rating of depression at 3 or below and demonstrate decreased signs of depression or be deemed stable for discharge by MD. 08/18/15: Patient reports no SI and depression of 0. Goal met.   3.  Goal (s): Patient will demonstrate decreased symptoms of psychosis. * Met: No  *  Target date: at discharge * As evidenced by: Patient will not endorse signs of psychosis or be deemed stable for discharge by MD.  08/18/15: Patient reports no psychosis but is awaiting a batter to pass through her intestines before discharge.  08/20/15: Patient is still awaiting battery which she swallowed to pass and will discharge once this happens. MD is stating this could happen in the next couple of days.  08/25/15: Patient is continued to be monitored for movement of battery she swallowed and cannot be discharged at this time as this is a risk and remains in BMU as medical floor will not accept patient.   Attendees:  Physician:   Orson Slick, MD 4/12/20173:40 PM  Nursing:   Polly Cobia, RN  4/12/20173:40 PM  Other:  Keene Breath, [place] 4/12/20173:40 PM  Other:  Garald Braver, Ph D  4/12/20173:40 PM  Patient:  Melanie Bell  4/12/20173:40 PM  Other:   4/12/20173:40 PM  Other:   4/12/20173:40 PM  Other:  4/12/20173:40 PM  Other:  4/12/20173:40 PM  Other:  4/12/20173:40 PM  Other:  4/12/20173:40 PM  Other:   4/12/20173:40 PM   Scribe for Treatment Team:   Keene Breath, MSW, LCSW  08/26/2015, 3:40 PM
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
## 2015-08-26 NOTE — [facility] Group Notes (Signed)
[facility] Group Notes:  (Nursing/MHT/Case Management/Adjunct)  Date:  08/26/2015  Time:  9:20 PM  Type of Therapy:  Group Therapy  Participation Level:  Active  Participation Quality:  Appropriate  Affect:  Appropriate  Cognitive:  Appropriate  Insight:  Appropriate  Engagement in Group:  Engaged  Modes of Intervention:  Discussion    Summary of Progress/Problems:  [name] 08/26/2015, 9:20 PM

## 2015-08-26 NOTE — B[facility] Group Notes (Signed)
[facility] Group Notes:  (Nursing/MHT/Case Management/Adjunct)  Date:  08/26/2015  Time:  3:49 PM  Type of Therapy:  Psychoeducational Skills  Participation Level:  Active  Participation Quality:  Appropriate, Attentive and Supportive  Affect:  Appropriate  Cognitive:  Appropriate  Insight:  Appropriate  Engagement in Group:  Supportive  Modes of Intervention:  Discussion and Education  Summary of Progress/Problems:  Charise Killian 08/26/2015, 3:49 PM

## 2015-08-26 NOTE — B[facility] Group Notes (Signed)
[facility] LCSW Group Therapy  08/26/2015 3:36 PM  Type of Therapy:  Group Therapy  Participation Level:  Did Not Attend   [name], MSW, LCSW 08/26/2015, 3:36 PM

## 2015-08-26 NOTE — Progress Notes (Signed)
D: Patient stated slept good last night .Stated appetite is good and energy level  Is normal. Stated concentration is good . Stated on Depression scale 1 , hopeless 0 and anxiety 3 .( low 0-10 high) Denies suicidal  homicidal ideations  .  No auditory hallucinations  No pain concerns . Appropriate ADL'S. Interacting with peers and staff. Patient informed by Dr. Colman Cater of procedure  Or possible  Surgery. voice of being afraid. A: Encourage patient participation with unit programming . Instruction  Given on  Medication , verbalize understanding. R: Voice no other concerns. Staff continue to monitor

## 2015-08-26 NOTE — Progress Notes (Signed)
D: Observed pt in the day room interacting. Patient alert and oriented x4. Patient denies SI/HI/AVH. Pt affect is appropriate. Pt rated depression 1/10 and anxiety 3/10. Pt attending most groups and active on the unit. Pt had one loose, watery, brown bowel movement. No foreign objects were found. Pt was complaining of "gas pain." Pt had no other complaints. A: Offered active listening and support. Provided therapeutic communication. Administered scheduled medications. Advised pt to try to walk to relieve gas pain and also gave Maalox. R: Pt pleasant and cooperative. Pt indicated that gas pain was relieved. Pt medication compliant. Will continue Q15 min. checks. Safety maintained.

## 2015-08-26 NOTE — Plan of Care (Signed)
Problem: Alteration in thought process Goal: STG-Patient is able to follow short directions Outcome: Progressing Compliant with unit programing  And directions
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
## 2015-08-26 NOTE — BHH Group Notes (Signed)
[facility] Group Notes:  (Nursing/MHT/Case Management/Adjunct)  Date:  08/26/2015  Time:  9:19 PM  Type of Therapy:  Group Therapy  Participation Level:  Active  Participation Quality:  Appropriate  Affect:  Appropriate  Cognitive:  Appropriate  Insight:  Appropriate  Engagement in Group:  Engaged  Modes of Intervention:  Discussion  Summary of Progress/Problems:  [name] 08/26/2015, 9:19 PM

## 2015-08-26 NOTE — B[facility] Group Notes (Signed)
[facility] LCSW Group Therapy  08/26/2015 3:37 PM   Type of Therapy:  Group Therapy  Participation Level:  Did Not Attend   [name], MSW, LCSW 08/26/2015, 3:37PM

## 2015-08-27 NOTE — Progress Notes (Signed)
[facility] MD Progress Note  08/27/2015 12:46 PM Melanie Bell  MRN:  [id number]  Subjective:  [name] feels happier as we advanced her diet to soft. She had daily bowel movements but so far has not been able to produce department.. I am in frequent contact with Dr. [name]. For now we agree on noninvasive measures.  Principal Problem: Schizoaffective disorder, bipolar type ([place]) Diagnosis:   Patient Active Problem List   Diagnosis Date Noted  . Foreign body alimentary tract [T18.9XXA]   . Foreign body ingestion [T18.9XXA] 07/31/2015  . Schizoaffective disorder, bipolar type ([place]) [F25.0] 11/06/2014  . Tardive dyskinesia [G24.01] 10/06/2014  . Tobacco use disorder [F17.200] 09/30/2014  . Hypothyroidism [E03.9] 09/29/2014  . Hypertension [I10] 09/29/2014  . Constipation [K59.00] 09/29/2014   Total Time spent with patient: 20 minutes  Past Psychiatric History: schizoaffective disorder.  Past Medical History:  Past Medical History  Diagnosis Date  . Hallucinations 09/30/2014    Sizoaffective  . Depression   . Anxiety   . Hypertension   . Tardive dyskinesia 10/2014    recent onset  . GERD (gastroesophageal reflux disease)   . Hyperlipidemia     Past Surgical History  Procedure Laterality Date  . Wisdom tooth extraction    . Esophagogastroduodenoscopy N/A 11/28/2014    Procedure: ESOPHAGOGASTRODUODENOSCOPY (EGD);  Surgeon: [name], MD;  Location: [facility] ENDOSCOPY;  Service: Endoscopy;  Laterality: N/A;  . Abdominal surgery      "years ago" to remove foreign objects  . Breast lumpectomy     Family History:  Family History  Problem Relation Age of Onset  . Depression Mother   . Hypertension Mother    Family Psychiatric  History: mother with depression. Social History:  History  Alcohol Use No     History  Drug Use No    Social History   Social History  . Marital Status: Single    Spouse Name: N/A  . Number of Children: N/A  . Years of Education: N/A    Social History Main Topics  . Smoking status: Current Every Day Smoker -- 0.50 packs/day for 3 years    Types: Cigarettes  . Smokeless tobacco: Never Used  . Alcohol Use: No  . Drug Use: No  . Sexual Activity: No   Other Topics Concern  . None   Social History Narrative   Additional Social History:    Pain Medications: See PTA Prescriptions: See PTA Over the Counter: See PTA History of alcohol / drug use?: No history of alcohol / drug abuse Longest period of sobriety (when/how long): No history of use                    Sleep: Fair  Appetite:  Good  Current Medications: Current Facility-Administered Medications  Medication Dose Route Frequency Provider Last Rate Last Dose  . acetaminophen (TYLENOL) tablet 650 mg  650 mg Oral Q6H PRN [name], MD   650 mg at 08/25/15 1606  . alum & mag hydroxide-simeth (MAALOX/MYLANTA) 200-200-20 MG/5ML suspension 30 mL  30 mL Oral Q4H PRN [name], MD   30 mL at 08/25/15 1951  . amLODipine (NORVASC) tablet 2.5 mg  2.5 mg Oral Daily [name], MD   2.5 mg at 08/27/15 0847  . benztropine (COGENTIN) tablet 1 mg  1 mg Oral QHS [name], MD   1 mg at 08/26/15 2122  . docusate sodium (COLACE) capsule 200 mg  200 mg Oral BID  [name], MD   200 mg at 08/27/15 0847  . fluPHENAZine (PROLIXIN) tablet 5 mg  5 mg Oral 3 times per day [name], MD   5 mg at 08/27/15 [id number]  . lactulose (CHRONULAC) 10 GM/15ML solution 30 g  30 g Oral TID [name], MD   30 g at 08/27/15 0849  . levothyroxine (SYNTHROID, LEVOTHROID) tablet 75 mcg  75 mcg Oral QAC breakfast [name], MD   75 mcg at 08/27/15 [id number]  . lurasidone (LATUDA) tablet 120 mg  120 mg Oral Q breakfast [name], MD   120 mg at 08/27/15 0848  . magnesium hydroxide (MILK OF MAGNESIA) suspension 30 mL  30 mL Oral Daily PRN [name], MD   30 mL at 08/24/15 1450  . milk and molasses enema  1 enema Rectal Once [name], MD   250 mL  at 08/16/15 1230  . nicotine (NICODERM CQ - dosed in mg/24 hours) patch 21 mg  21 mg Transdermal Daily Alante Weimann B Trygve Thal, MD   21 mg at 08/27/15 0853  . Oxcarbazepine (TRILEPTAL) tablet 150 mg  150 mg Oral BID [name], MD   150 mg at 08/27/15 0848  . polyethylene glycol (MIRALAX / GLYCOLAX) packet 17 g  17 g Oral BID [name], MD   17 g at 08/27/15 0846  . senna (SENOKOT) tablet 17.2 mg  2 tablet Oral QHS [name], MD   17.2 mg at 08/26/15 2122  . traZODone (DESYREL) tablet 100 mg  100 mg Oral QHS [name], MD   100 mg at 08/26/15 2122  . venlafaxine XR (EFFEXOR-XR) 24 hr capsule 150 mg  150 mg Oral Q breakfast [name], MD   150 mg at 08/27/15 0847    Lab Results: No results found for this or any previous visit (from the past 48 hour(s)).  Blood Alcohol level:  Lab Results  Component Value Date   ETH <5 07/30/2015   ETH <5 07/13/2015    Physical Findings: AIMS: Facial and Oral Movements Muscles of Facial Expression: None, normal Lips and Perioral Area: None, normal Jaw: None, normal Tongue: None, normal,Extremity Movements Upper (arms, wrists, hands, fingers): None, normal Lower (legs, knees, ankles, toes): None, normal, Trunk Movements Neck, shoulders, hips: None, normal, Overall Severity Severity of abnormal movements (highest score from questions above): None, normal Incapacitation due to abnormal movements: None, normal Patient's awareness of abnormal movements (rate only patient's report): Aware, no distress, Dental Status Current problems with teeth and/or dentures?: No Does patient usually wear dentures?: No  CIWA:    COWS:     Musculoskeletal: Strength & Muscle Tone: within normal limits Gait & Station: normal Patient leans: N/A  Psychiatric Specialty Exam: Review of Systems  All other systems reviewed and are negative.   Blood pressure 117/74, pulse 87, temperature 98 F (36.7 C), temperature source Oral, resp. rate 20,  height 5\' 6"  (1.676 m), weight 86.183 kg (190 lb), SpO2 100 %.Body mass index is 30.68 kg/(m^2).  General Appearance: Casual  Eye Contact::  Good  Speech:  Clear and Coherent  Volume:  Normal  Mood:  Euthymic  Affect:  Appropriate  Thought Process:  Goal Directed  Orientation:  Full (Time, Place, and Person)  Thought Content:  WDL  Suicidal Thoughts:  No  Homicidal Thoughts:  No  Memory:  Immediate;   Fair Recent;   Fair Remote;   Fair  Judgement:  Poor  Insight:  Lacking  Psychomotor Activity:  Normal  Concentration:  Fair  Recall:  [employer] of Knowledge:Fair  Language: Fair  Akathisia:  No  Handed:  Right  AIMS (if indicated):     Assets:  Communication Skills Desire for Improvement Financial Resources/Insurance Housing Resilience Social Support  ADL's:  Intact  Cognition: WNL  Sleep:  Number of Hours: 7.5   Treatment Plan Summary: Daily contact with patient to assess and evaluate symptoms and progress in treatment and Medication management   [name] is a 25 year old female with a history of schizoaffective disorder admitted for worsening of depression and suicide attempt by swallowing an AA battery, an earing and a screw.   1. Suicidal ideation. This has resolved. The patient is able to contract for safety. She is forward thinking and optimistic about the future.    2. Mood and psychosis. We continued Prolixin 5mg  po TID and Latuda 120mg  po daily for psychosis, Trileptal 150mg  po BID for mood stabilization, and Effexor XR 150mg  po daily for depression.   3. Hypertension. She is on amlodipine 2.5mg  po daily.  4. Constipation. She is on bowel regimen for constipation including Colace and Miralax and bowels are moving.  5. Hypothyroidism. She is on Synthroid 52mcg po daily  6. Smoking. Nicotine patch was available.  7. Insomnia. She responded well to trazodone.  8. Social. She is an incompetent adult. Her Melanie Bell is the guardian. She recently moved to  [place].  9. Metabolic syndrome. Lipid panel, hemoglobin A1c, TSH, and PRL were checked during previous admission and were normal. Prolactin 15.   10. Foreign body ingestion. There are three foreign objects present on X-ray. The AA battery was injected on 3/15. She will continue bowel regimen and Lactuose. Input from GI and surgery is greatly appreciated. After talking to Dr. [name], GI, we progressed her diet to soft. There will be no surgical intervention unless there is perforation/peritonitis. We will continue laxatives.   11. Disposition. She will be discharged back to her group home and follow up with her regular psychiatrist at [facility] for medication management. She will continue in the day program.  Orson Slick, MD 08/27/2015, 12:46 PM

## 2015-08-27 NOTE — Progress Notes (Signed)
D: Patient stated slept good last night .Stated appetite is good and energy level  Is normal. Stated concentration is good . Stated on Depression scale 5 , hopeless 0 and anxiety 10 .( low 0-10 high) Denies suicidal  homicidal ideations  .  No auditory hallucinations  No pain concerns . Appropriate ADL'S. Interacting with peers and staff. Patient voice of anxiety around the expelling of  Items she ingested. Voice of coping and working  On her coping skills  A: Encourage patient participation with unit programming . Instruction  Given on  Medication , verbalize understanding. R: Voice no other concerns. Staff continue to monitor

## 2015-08-27 NOTE — Progress Notes (Signed)
D: Pt is seen in the milieu this evening interacting appropriately with staff and peers. Patient denies SI/HI/AVH at this time. Pt state she is having some abdominal pain and has not had a BM today. Patient requested Tylenol which was given.  A: Emotional support and encouragement provided. Medications administered with education. Safety checks maintained. R: Pt remains free from harm. Will continue to monitor.

## 2015-08-27 NOTE — Progress Notes (Signed)
D: Observed pt in the day room interacting. Patient alert and oriented x4. Patient denies SI/HI/AVH. Pt affect is appropriate. Pt rated depression 1/10 and anxiety 1/10. Pt attending most groups and active on the unit. Pt had no bowel movements this shift. Pt had no complaints. A: Offered active listening and support. Provided therapeutic communication. Administered scheduled medications.  R: Pt pleasant and cooperative. Pt medication compliant. Will continue Q15 min. checks. Safety maintained.

## 2015-08-27 NOTE — Plan of Care (Signed)
Problem: Diagnosis: Increased Risk For Suicide Attempt Goal: LTG-Patient Will Show Positive Response to Medication LTG (by discharge) : Patient will show positive response to medication and will participate in the development of the discharge plan.  Outcome: Progressing Compliant with medications

## 2015-08-27 NOTE — Consult Note (Signed)
Chart reviewed.  Abdominal films ordered for tomorrow am.  Dr Candace Cruise on call for GI if needed over the weekend.   [name], MD Gastroenterology

## 2015-08-27 NOTE — BHH Group Notes (Signed)
[place] Group Notes:  (Nursing/MHT/Case Management/Adjunct)  Date:  08/27/2015  Time:  4:49 PM  Type of Therapy:  Psychoeducational Skills  Participation Level:  Minimal  Participation Quality:  Attentive and came in late   Affect:  Appropriate  Cognitive:  Appropriate  Insight:  Appropriate  Engagement in Group:  Supportive  Modes of Intervention:  Discussion and Education  Summary of Progress/Problems:  Charise Killian 08/27/2015, 4:49 PM

## 2015-08-27 NOTE — BHH Group Notes (Signed)
[place] LCSW Group Therapy  08/27/2015 9:20 AM  Type of Therapy:  Group Therapy  Participation Level:  Did Not Attend  Modes of Intervention:  Discussion, Education, Socialization and Support  Summary of Progress/Problems: Balance in life: Patients will discuss the concept of balance and how it looks and feels to be unbalanced. Pt will identify areas in their life that is unbalanced and ways to become more balanced.    [place]  MSW, [place]   08/27/2015, 9:20 AM

## 2015-08-27 NOTE — Plan of Care (Signed)
Problem: Alteration in thought process Goal: LTG-Patient behavior demonstrates decreased signs psychosis (Patient behavior demonstrates decreased signs of psychosis to the point the patient is safe to return home and continue treatment in an outpatient setting.)  Outcome: Progressing Pt denies AVH and does not appear to be responding to internal stimuli

## 2015-08-27 NOTE — Progress Notes (Signed)
RN called Dr Gretel Acre and made her aware that pt is still constipated and complaining of pain not relieved by Tylenol. Dr Gretel Acre stated to continue observing the patient at this time.

## 2015-08-28 ENCOUNTER — Inpatient Hospital Stay: Payer: MEDICAID

## 2015-08-28 IMAGING — CR DG ABDOMEN 2V
1 series · 2 of 2 positions shown · non-contrast
Comparison: Multiple films, most recent [DATE]

CLINICAL DATA: Ingestion of foreign body.

EXAM:
ABDOMEN - 2 VIEW

[Series 1: dg abd 2 views · 0.14mm/px · 2 of 2 slices shown]
[im 1/2]
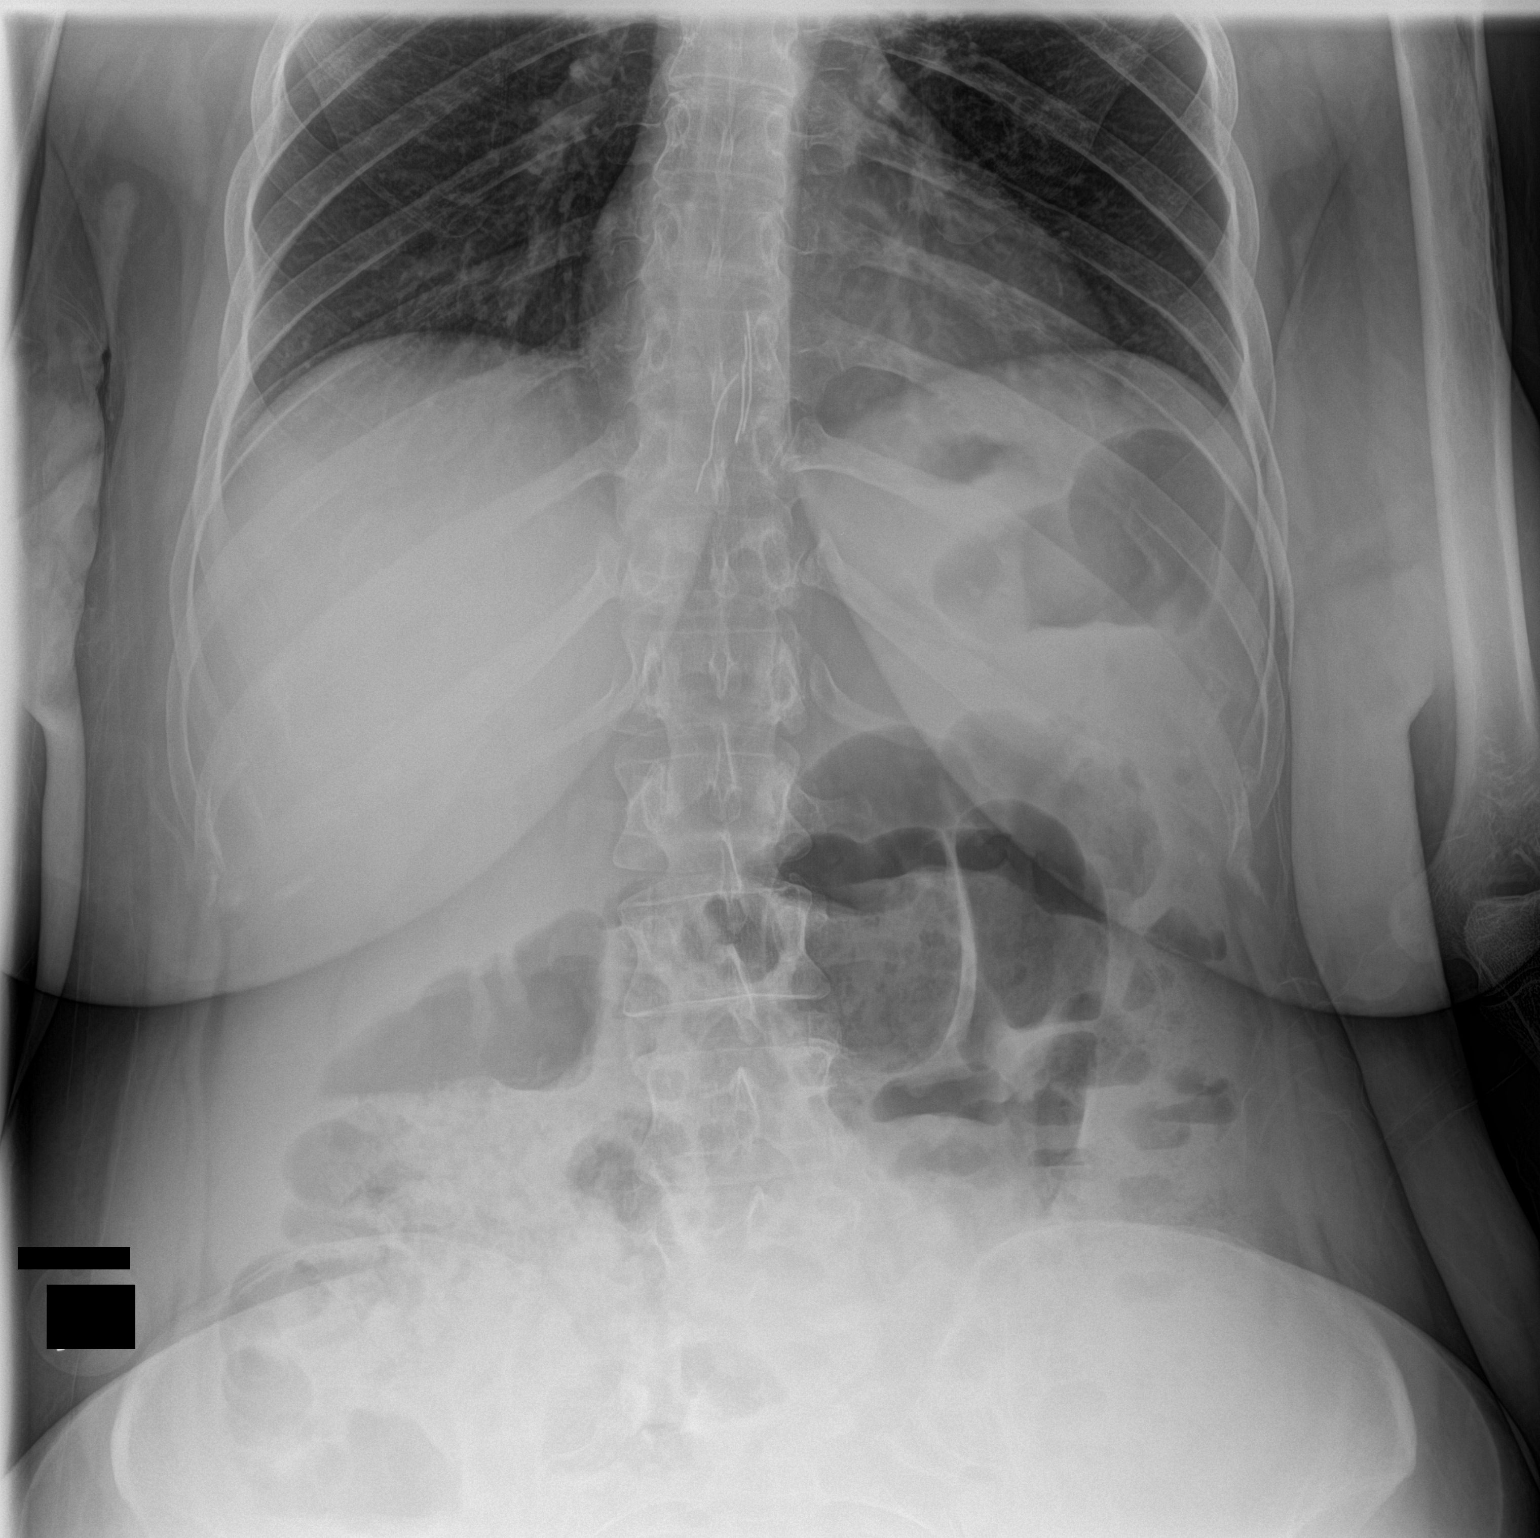
[im 2/2]
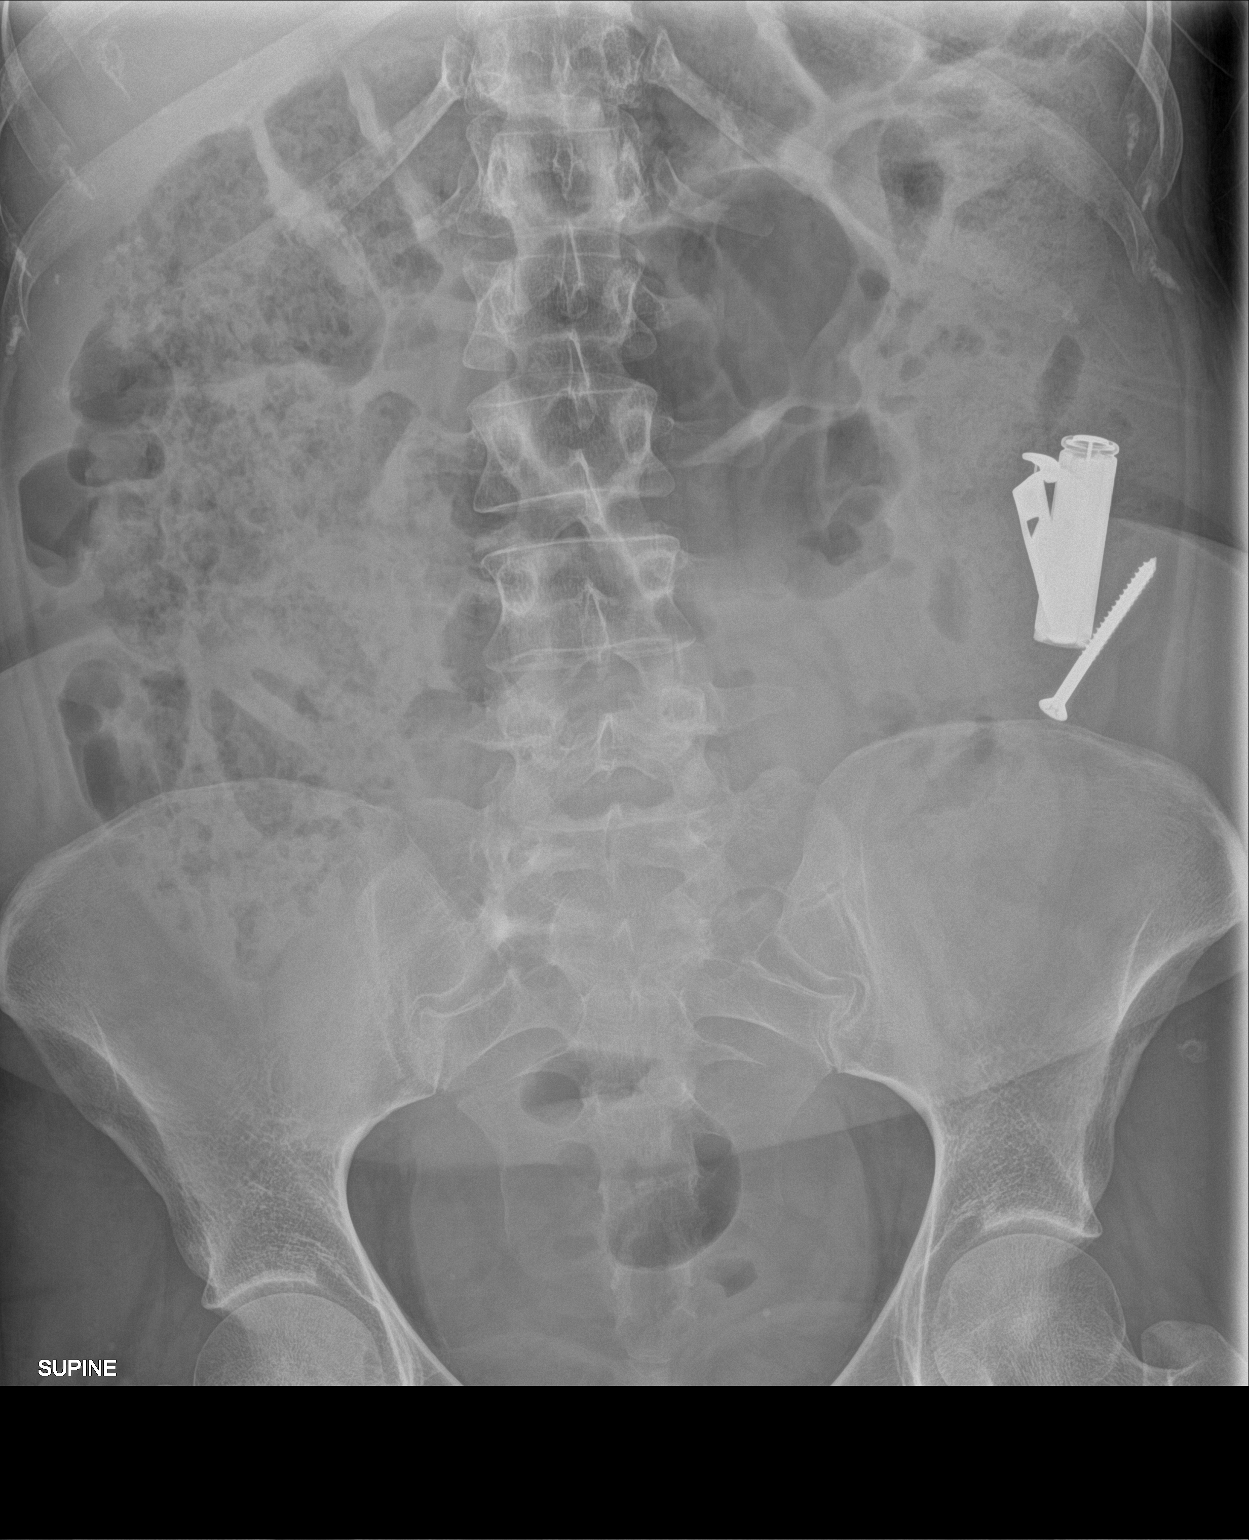

[2 of 2 positions shown; findings below may reference images not displayed]

FINDINGS: Multiple foreign bodies are again seen projecting over the left
abdomen, likely within the descending colon. These have moved near
the pelvis slightly since prior study. Again noted are linear
radiopaque foreign bodies projecting over the distal esophagus. No
evidence of bowel obstruction or free air. Moderate to large stool
burden in the colon.
IMPRESSION: Slight movement of radiopaque foreign bodies in the left abdomen
toward the pelvis, likely in the descending colon. Continued linear
radiopaque foreign bodies project over the distal esophagus. No free
air.

## 2015-08-28 NOTE — BHH Group Notes (Signed)
[place] Group Notes:  (Nursing/MHT/Case Management/Adjunct)  Date:  08/28/2015  Time:  2:24 AM  Type of Therapy:  Group Therapy  Participation Level:  Minimal  Participation Quality:  Attentive  Affect:  Flat  Cognitive:  Alert  Insight:  Appropriate  Engagement in Group:  Limited  Modes of Intervention:  Discussion  Summary of Progress/Problems: Pt said that it had been a hard day for her. That she had a lot on her mind, but would not discuss it with staff.   Chenea Kipper Monzerat Handler 08/28/2015, 2:24 AM

## 2015-08-28 NOTE — Progress Notes (Signed)
[facility] MD Progress Note  08/28/2015 10:17 AM [name]  MRN:  [id number]  Subjective:  Melanie Bell is a 25 year old female with a history of schizoaffective disorder and a nag for swallowing things. I had an agreement with her that she would come to the hospital before swallowing rather than after. When she came to the emergency room complaining of suicidal ideation she was sent home. She responded by swallowing 3 objects, a screw, and earing, and AA battery on March 15. She was briefly admitted to medical floor and then transferred to psychiatry. She has not been suicidal, homicidal, excessively anxious, or psychotic. I am unable to discharge her back to her group home, per GI service recommendation, until the battery is expelled. X-Ray obtained this morning indicates some anterograde movement. She did not have a bowel movement yesterday in spite of advancing her diet to soft, double portions, and laxatives. Dr. [name], GI, has been following this patient and his help is greatly appreciated.   Melanie Bell has no complaints today. She is tired of sitting in the hospital but is compliant with treatment. Sleep and appetite are good. She is not depressed, psychotic, or anxious. She is not suicidal or homicidal. She takes medications and tolerates them well. She attends groups regularly.   Principal Problem: Schizoaffective disorder, bipolar type ([place]) Diagnosis:   Patient Active Problem List   Diagnosis Date Noted  . Foreign body alimentary tract [T18.9XXA]   . Foreign body ingestion [T18.9XXA] 07/31/2015  . Schizoaffective disorder, bipolar type ([place]) [F25.0] 11/06/2014  . Tobacco use disorder [F17.200] 09/30/2014  . Hypothyroidism [E03.9] 09/29/2014  . Hypertension [I10] 09/29/2014  . Constipation [K59.00] 09/29/2014   Total Time spent with patient: 20 minutes  Past Psychiatric History:  schizoaffective disorder  Medical History:  Past Medical History  Diagnosis Date  .  Hallucinations 09/30/2014    Sizoaffective  . Depression   . Anxiety   . Hypertension   . Tardive dyskinesia 10/2014    recent onset  . GERD (gastroesophageal reflux disease)   . Hyperlipidemia     Past Surgical History  Procedure Laterality Date  . Wisdom tooth extraction    . Esophagogastroduodenoscopy N/A 11/28/2014    Procedure: ESOPHAGOGASTRODUODENOSCOPY (EGD);  Surgeon: [name], MD;  Location: [facility] ENDOSCOPY;  Service: Endoscopy;  Laterality: N/A;  . Abdominal surgery      "years ago" to remove foreign objects  . Breast lumpectomy     Family History:  Family History  Problem Relation Age of Onset  . Depression Mother   . Hypertension Mother    Family Psychiatric  History:  mother with depression.  History  Alcohol Use No     History  Drug Use No    Social History   Social History  . Marital Status: Single    Spouse Name: N/A  . Number of Children: N/A  . Years of Education: N/A   Social History Main Topics  . Smoking status: Current Every Day Smoker -- 0.50 packs/day for 3 years    Types: Cigarettes  . Smokeless tobacco: Never Used  . Alcohol Use: No  . Drug Use: No  . Sexual Activity: No   Other Topics Concern  . None   Social History Narrative   Additional Social History:    Pain Medications: See PTA Prescriptions: See PTA Over the Counter: See PTA History of alcohol / drug use?: No history of alcohol / drug abuse Longest period of sobriety (when/how long): No history of  use                    Sleep: Fair  Appetite:  Fair  Current Medications: Current Facility-Administered Medications  Medication Dose Route Frequency Provider Last Rate Last Dose  . acetaminophen (TYLENOL) tablet 650 mg  650 mg Oral Q6H PRN [name], MD   650 mg at 08/27/15 2003  . alum & mag hydroxide-simeth (MAALOX/MYLANTA) 200-200-20 MG/5ML suspension 30 mL  30 mL Oral Q4H PRN [name], MD   30 mL at 08/25/15 1951  . amLODipine (NORVASC) tablet  2.5 mg  2.5 mg Oral Daily [name], MD   2.5 mg at 08/28/15 0958  . benztropine (COGENTIN) tablet 1 mg  1 mg Oral QHS [name], MD   1 mg at 08/27/15 2109  . docusate sodium (COLACE) capsule 200 mg  200 mg Oral BID [name], MD   200 mg at 08/28/15 0959  . fluPHENAZine (PROLIXIN) tablet 5 mg  5 mg Oral 3 times per day [name], MD   5 mg at 08/28/15 0703  . lactulose (CHRONULAC) 10 GM/15ML solution 30 g  30 g Oral TID [name], MD   30 g at 08/28/15 1004  . levothyroxine (SYNTHROID, LEVOTHROID) tablet 75 mcg  75 mcg Oral QAC breakfast [name], MD   75 mcg at 08/28/15 [id number]  . lurasidone (LATUDA) tablet 120 mg  120 mg Oral Q breakfast [name], MD   120 mg at 08/28/15 0958  . magnesium hydroxide (MILK OF MAGNESIA) suspension 30 mL  30 mL Oral Daily PRN [name], MD   30 mL at 08/24/15 1450  . milk and molasses enema  1 enema Rectal Once [name], MD   250 mL at 08/16/15 1230  . nicotine (NICODERM CQ - dosed in mg/24 hours) patch 21 mg  21 mg Transdermal Daily Thao Vanover B Carlina Derks, MD   21 mg at 08/28/15 0959  . Oxcarbazepine (TRILEPTAL) tablet 150 mg  150 mg Oral BID [name], MD   150 mg at 08/28/15 0959  . polyethylene glycol (MIRALAX / GLYCOLAX) packet 17 g  17 g Oral BID [name], MD   17 g at 08/28/15 0959  . senna (SENOKOT) tablet 17.2 mg  2 tablet Oral QHS [name], MD   17.2 mg at 08/27/15 2109  . traZODone (DESYREL) tablet 100 mg  100 mg Oral QHS [name], MD   100 mg at 08/27/15 2109  . venlafaxine XR (EFFEXOR-XR) 24 hr capsule 150 mg  150 mg Oral Q breakfast [name], MD   150 mg at 08/28/15 [id number]    Lab Results: No results found for this or any previous visit (from the past 48 hour(s)).  Blood Alcohol level:  Lab Results  Component Value Date   ETH <5 07/30/2015   ETH <5 07/13/2015    Physical Findings: AIMS: Facial and Oral Movements Muscles of Facial Expression: None, normal Lips  and Perioral Area: None, normal Jaw: None, normal Tongue: None, normal,Extremity Movements Upper (arms, wrists, hands, fingers): None, normal Lower (legs, knees, ankles, toes): None, normal, Trunk Movements Neck, shoulders, hips: None, normal, Overall Severity Severity of abnormal movements (highest score from questions above): None, normal Incapacitation due to abnormal movements: None, normal Patient's awareness of abnormal movements (rate only patient's report): Aware, no distress, Dental Status Current problems with teeth and/or dentures?: No Does patient  usually wear dentures?: No  CIWA:    COWS:     Musculoskeletal: Strength & Muscle Tone: within normal limits Gait & Station: normal Patient leans: N/A  Psychiatric Specialty Exam: Review of Systems  All other systems reviewed and are negative.   Blood pressure 115/64, pulse 74, temperature 98 F (36.7 C), temperature source Oral, resp. rate 18, height 5\' 6"  (1.676 m), weight 86.183 kg (190 lb), SpO2 100 %.Body mass index is 30.68 kg/(m^2).  General Appearance: Casual  Eye Contact::  Good  Speech:  Clear and Coherent  Volume:  Normal  Mood:  Euthymic  Affect:  Blunt  Thought Process:  Goal Directed  Orientation:  Full (Time, Place, and Person)  Thought Content:  WDL  Suicidal Thoughts:  No  Homicidal Thoughts:  No  Memory:  Immediate;   Fair Recent;   Fair Remote;   Fair  Judgement:  Poor  Insight:  Shallow  Psychomotor Activity:  Normal  Concentration:  Fair  Recall:  [place]: Fair  Akathisia:  No  Handed:  Right  AIMS (if indicated):     Assets:  Communication Skills Desire for Improvement Financial Resources/Insurance Housing Resilience Social Support  ADL's:  Intact  Cognition: WNL  Sleep:  Number of Hours: 7.75   Treatment Plan Summary: Daily contact with patient to assess and evaluate symptoms and progress in treatment and Medication management   Melanie Bell is a  25 year old female with a history of schizoaffective disorder admitted for worsening of depression and suicide attempt by swallowing an AA battery, an earing and a screw.   1. Suicidal ideation. This has resolved. The patient is able to contract for safety. She is forward thinking and optimistic about the future.    2. Mood and psychosis. We continued Prolixin 5mg  po TID and Latuda 120mg  po daily for psychosis, Trileptal 150mg  po BID for mood stabilization, and Effexor XR 150mg  po daily for depression.   3. Hypertension. She is on amlodipine 2.5mg  po daily.  4. Constipation. She is on bowel regimen.   5. Hypothyroidism. She is on Synthroid 93mcg po daily  6. Smoking. Nicotine patch is available.  7. Insomnia. She responded well to trazodone.  8. Social. She is an incompetent adult. Her Kara Dies is the guardian. She lives in [place].  9. Metabolic syndrome. Lipid panel, hemoglobin A1c, TSH, and PRL were checked during previous admission and were normal. Prolactin 15.   10. Foreign body ingestion. There are three foreign objects present on X-ray. The AA battery was injected on 3/15. She will continue bowel regimen and Lactuose. Input from GI and surgery is greatly appreciated. After talking to Dr. [name], GI, we progressed her diet to soft. There will be no surgical intervention unless there is perforation/peritonitis. X-ray on 08/28/2015 shows slight movement of foreign objects along descending colon.  11. Disposition. She will be discharged back to her group home and follow up with her regular psychiatrist at [facility] for medication management. She will continue in the day program.  Orson Slick, MD 08/28/2015, 10:17 AM

## 2015-08-28 NOTE — BHH Group Notes (Signed)
[facility] LCSW Aftercare Discharge Planning Group Note   08/28/2015 10:46 AM  Participation Quality:  Did not attend   [place] MSW, [place]

## 2015-08-28 NOTE — B[facility] Group Notes (Signed)
[place] LCSW Group Therapy  08/28/2015 12:40 PM  Type of Therapy:  Group Therapy  Participation Level:  Did Not Attend  Modes of Intervention:  Discussion, Education, Socialization and Support  Summary of Progress/Problems: Feelings around Relapse. Group members discussed the meaning of relapse and shared personal stories of relapse, how it affected them and others, and how they perceived themselves during this time. Group members were encouraged to identify triggers, warning signs and coping skills used when facing the possibility of relapse. Social supports were discussed and explored in detail.  [place] MSW, [place]  08/28/2015, 12:40 PM

## 2015-08-28 NOTE — Progress Notes (Signed)
Pt complains of abdominal pain, but declines pain medicine, reports it does not work. Encouraged pt to rest and reposition. Pt stayed in bed most of morning, came out for meals. Pt did socialize with peers at lunch and was visible in mileu. No signs of psychosis noted. No swallowing reported or observed. Med and group comliant. Encouragement and support offered. Pt receptive and remains safe on unit with q 15 min checks.

## 2015-08-28 NOTE — Tx Team (Addendum)
Interdisciplinary Treatment Plan Update (Adult)  Date:  08/28/2015 Time Reviewed:  12:27 PM  Progress in Treatment: Attending groups: Yes. Participating in groups:  Yes. Taking medication as prescribed:  Yes. Tolerating medication:  Yes. Family/Significant othe contact made:  Yes, individual(s) contacted:  mother and group home Patient understands diagnosis:  Yes. Discussing patient identified problems/goals with staff:  Yes. Medical problems stabilized or resolved:  No. and As evidenced by:  Pt has not passed the battery yet.  Denies suicidal/homicidal ideation: Yes. Issues/concerns per patient self-inventory:  Yes. Other:  New problem(s) identified: No, Describe:  NA  Discharge Plan or Barriers: Pt plans to return to group home and follow up with outpatient.    Reason for Continuation of Hospitalization: Hallucinations Medication stabilization Other; describe Pt swallowed objects  Comments: [name] is a 25 year old female with a history of schizoaffective disorder. She was briefly admitted to medical floor after she swallowed 3 foreign objects including a screw, AA battery and an earring. She was transferred to psychiatry before she passed them. The battery has been there since March 15. Apparently about that he should not stick around the gap for longer than a week according to GI note. She is now being followed by Dr. [name], GI. Surgery refuses to operate until there is perforation and/or peritonitis. The patient is no longer in need of psychiatric care but I could not discharge her to home given her medical condition. She is on liquid diet and laxatives.  [name] denies any symptoms of depression, anxiety, or psychosis. She denies any abdominal pain. In spite over multiple laxatives and lactulose she has not had a bowel movement yesterday. We will give mag citrate again today as her x-ray indicates massive amounts of stool in the bowels. She accepts medications and  tolerates them well. She participates well in programming.   Estimated length of stay: up to 7 days, expected discharge by Thursday 4/120/17  New goal(s): NA  Review of initial/current patient goals per problem list:   1.  Goal(s): Patient will participate in aftercare plan * Met: Yes * Target date: at discharge * As evidenced by: Patient will participate within aftercare plan AEB aftercare provider and housing plan at discharge being identified. 08/18/15: Patient has a provider and will be referred to [facility] and has a group home which she can return to at discharge. Goal met.    2.  Goal (s): Patient will exhibit decreased depressive symptoms and suicidal ideations. * Met: Yes *  Target date: at discharge * As evidenced by: Patient will utilize self rating of depression at 3 or below and demonstrate decreased signs of depression or be deemed stable for discharge by MD. 08/18/15: Patient reports no SI and depression of 0. Goal met.   3.  Goal (s): Patient will demonstrate decreased symptoms of psychosis. * Met: No  *  Target date: at discharge * As evidenced by: Patient will not endorse signs of psychosis or be deemed stable for discharge by MD.  08/18/15: Patient reports no psychosis but is awaiting a batter to pass through her intestines before discharge.  08/20/15: Patient is still awaiting battery which she swallowed to pass and will discharge once this happens. MD is stating this could happen in the next couple of days.  08/25/15: Patient is continued to be monitored for movement of battery she swallowed and cannot be discharged at this time as this is a risk and remains in BMU as medical floor will not accept patient.  08/27/15: Patient is still waiting for batter to discharge but no luck yet.   Attendees: Physician:   Orson Slick, MD 4/14/201712:27 PM  Nursing:   Polly Cobia, RN  4/14/201712:27 PM  Other:  Keene Breath, [place] 4/14/201712:27 PM  Other:  Garald Braver, Ph D   4/14/201712:27 PM  Patient:  Melanie Bell  4/14/201712:27 PM  Other:  Abran Cantor, RN 4/14/201712:27 PM  Other:  4/14/201712:27 PM  Other:  4/14/201712:27 PM  Other:  4/14/201712:27 PM  Other:  4/14/201712:27 PM  Other:  4/14/201712:27 PM  Other:   4/14/201712:27 PM   Scribe for Treatment Team:   Keene Breath, MSW, LCSW  08/28/2015, 12:27 PM

## 2015-08-28 NOTE — Plan of Care (Signed)
Problem: Alteration in thought process Goal: LTG-Patient has not harmed self or others in at least 2 days Outcome: Progressing Pt remains free from harm.  Problem: Diagnosis: Increased Risk For Suicide Attempt Goal: STG-Patient Will Comply With Medication Regime Outcome: Progressing Pt taking medications as prescribed. She asks questions appropriately.

## 2015-08-28 NOTE — Plan of Care (Signed)
Problem: Alteration in thought process Goal: LTG-Patient behavior demonstrates decreased signs psychosis (Patient behavior demonstrates decreased signs of psychosis to the point the patient is safe to return home and continue treatment in an outpatient setting.)  Outcome: Progressing No abnormal behaviors reported or observed

## 2015-08-29 NOTE — Progress Notes (Signed)
Melanie Bell was pleasant on approach. She interacted well with staff and peers. She had no behavioral issues on shift thus far. Conversation seemed logical and coherent on approach. She appears to be resting in bed at this time.

## 2015-08-29 NOTE — Progress Notes (Signed)
D: Pt is seen in the milieu interacting with peers this evening. She continues to c/o abdominal pain but refuses PRN medication. Denies SI/HI/AVH at this time. Pt rates her anxiety as a 10 out of 10 related to waiting to pass the objects. Pt has not had a BM this shift. A: Emotional support and encouragement provided. Medications administered with education. q15 minute safety checks maintained. R: Pt remains free from harm. Will continue to monitor.

## 2015-08-29 NOTE — [facility] Group Notes (Signed)
[place] LCSW Group Therapy  08/29/2015 2:55 PM  Type of Therapy:  Group Therapy  Participation Level:  Did Not Attend  Modes of Intervention:  Discussion, Education, Socialization and Support  Summary of Progress/Problems: Boundaries: Patients defined boundaries and discussed the importance of them. Patients identified their own boundaries and how they feel when they are crossed. Patients discussed ways to create and/ or improve their personal boundaries.    [place] MSW, [place]  08/29/2015, 2:55 PM

## 2015-08-29 NOTE — Progress Notes (Addendum)
Patient with sad affect, cooperative behavior with meals, meds and plan of care. No SI/SH/HI at this time. Quiet speech, fair eye contact. Patient sleepy in bed this am and states she "is not a morning person". Attends part of outside am therapy group. No distress, no discomfort, safety maintained. One bowel movement at this time. No foreign objects noted.

## 2015-08-29 NOTE — Plan of Care (Signed)
Problem: Consults Goal: Suicide Risk Patient Education (See Patient Education module for education specifics)  Outcome: Progressing Patient with no SI/SH/HI at this time. Patient attends part of one am therapy groups outside and returns to room stating it is "to hot".

## 2015-08-29 NOTE — Progress Notes (Signed)
Patient ID: Melanie Bell, female   DOB: [birth year], 25 y.o.   MRN: [id number] [facility] MD Progress Note  08/29/2015 10:13 AM Melanie Bell  MRN:  [id number]  Subjective:  Melanie Bell is a 25 year old female with a history of schizoaffective disorder and a history for swallowing things. Patient was admitted after she swallowed 3 objects which included a screw, yielding an and AA battery on 15th of March. She is been in the hospital since then waiting to expelled these objects. She has had an x-ray and there has been some anterograde movement. For the last 2 days she has been complaining of abdominal pain but has been refusing any medications. She states today that she is waiting for this pass these objects and be discharged home. Reports good sleep and appetite. She denies any depression, hearing voices or other symptoms. Denies any suicidal or homicidal ideations. She is been attending groups and participating to some extent.  Principal Problem: Schizoaffective disorder, bipolar type ([place]) Diagnosis:   Patient Active Problem List   Diagnosis Date Noted  . Foreign body alimentary tract [T18.9XXA]   . Foreign body ingestion [T18.9XXA] 07/31/2015  . Schizoaffective disorder, bipolar type ([place]) [F25.0] 11/06/2014  . Tobacco use disorder [F17.200] 09/30/2014  . Hypothyroidism [E03.9] 09/29/2014  . Hypertension [I10] 09/29/2014  . Constipation [K59.00] 09/29/2014   Total Time spent with patient: 20 minutes  Past Psychiatric History:  schizoaffective disorder  Medical History:  Past Medical History  Diagnosis Date  . Hallucinations 09/30/2014    Sizoaffective  . Depression   . Anxiety   . Hypertension   . Tardive dyskinesia 10/2014    recent onset  . GERD (gastroesophageal reflux disease)   . Hyperlipidemia     Past Surgical History  Procedure Laterality Date  . Wisdom tooth extraction    . Esophagogastroduodenoscopy N/A 11/28/2014    Procedure: ESOPHAGOGASTRODUODENOSCOPY (EGD);   Surgeon: [name], MD;  Location: [facility] ENDOSCOPY;  Service: Endoscopy;  Laterality: N/A;  . Abdominal surgery      "years ago" to remove foreign objects  . Breast lumpectomy     Family History:  Family History  Problem Relation Age of Onset  . Depression Mother   . Hypertension Mother    Family Psychiatric  History:  mother with depression.  History  Alcohol Use No     History  Drug Use No    Social History   Social History  . Marital Status: Single    Spouse Name: N/A  . Number of Children: N/A  . Years of Education: N/A   Social History Main Topics  . Smoking status: Current Every Day Smoker -- 0.50 packs/day for 3 years    Types: Cigarettes  . Smokeless tobacco: Never Used  . Alcohol Use: No  . Drug Use: No  . Sexual Activity: No   Other Topics Concern  . None   Social History Narrative   Additional Social History:    Pain Medications: See PTA Prescriptions: See PTA Over the Counter: See PTA History of alcohol / drug use?: No history of alcohol / drug abuse Longest period of sobriety (when/how long): No history of use                    Sleep: Fair  Appetite:  Fair  Current Medications: Current Facility-Administered Medications  Medication Dose Route Frequency Provider Last Rate Last Dose  . acetaminophen (TYLENOL) tablet 650 mg  650 mg Oral Q6H PRN [name],  MD   650 mg at 08/27/15 2003  . alum & mag hydroxide-simeth (MAALOX/MYLANTA) 200-200-20 MG/5ML suspension 30 mL  30 mL Oral Q4H PRN [name], MD   30 mL at 08/25/15 1951  . amLODipine (NORVASC) tablet 2.5 mg  2.5 mg Oral Daily [name], MD   2.5 mg at 08/29/15 0205  . benztropine (COGENTIN) tablet 1 mg  1 mg Oral QHS [name], MD   1 mg at 08/28/15 2120  . docusate sodium (COLACE) capsule 200 mg  200 mg Oral BID [name], MD   200 mg at 08/29/15 0940  . fluPHENAZine (PROLIXIN) tablet 5 mg  5 mg Oral 3 times per day [name], MD   5 mg at 08/29/15  [id number]  . lactulose (CHRONULAC) 10 GM/15ML solution 30 g  30 g Oral TID [name], MD   30 g at 08/29/15 0942  . levothyroxine (SYNTHROID, LEVOTHROID) tablet 75 mcg  75 mcg Oral QAC breakfast [name], MD   75 mcg at 08/29/15 [phone]  . lurasidone (LATUDA) tablet 120 mg  120 mg Oral Q breakfast [name], MD   120 mg at 08/29/15 0940  . magnesium hydroxide (MILK OF MAGNESIA) suspension 30 mL  30 mL Oral Daily PRN [name], MD   30 mL at 08/24/15 1450  . milk and molasses enema  1 enema Rectal Once [name], MD   250 mL at 08/16/15 1230  . nicotine (NICODERM CQ - dosed in mg/24 hours) patch 21 mg  21 mg Transdermal Daily [name] [name], MD   21 mg at 08/29/15 0941  . Oxcarbazepine (TRILEPTAL) tablet 150 mg  150 mg Oral BID [name], MD   150 mg at 08/29/15 0942  . polyethylene glycol (MIRALAX / GLYCOLAX) packet 17 g  17 g Oral BID [name], MD   17 g at 08/29/15 0941  . senna (SENOKOT) tablet 17.2 mg  2 tablet Oral QHS [name], MD   17.2 mg at 08/28/15 2120  . traZODone (DESYREL) tablet 100 mg  100 mg Oral QHS [name], MD   100 mg at 08/28/15 2120  . venlafaxine XR (EFFEXOR-XR) 24 hr capsule 150 mg  150 mg Oral Q breakfast [name], MD   150 mg at 08/29/15 [id number]    Lab Results: No results found for this or any previous visit (from the past 48 hour(s)).  Blood Alcohol level:  Lab Results  Component Value Date   ETH <5 07/30/2015   ETH <5 07/13/2015    Physical Findings: AIMS: Facial and Oral Movements Muscles of Facial Expression: None, normal Lips and Perioral Area: None, normal Jaw: None, normal Tongue: None, normal,Extremity Movements Upper (arms, wrists, hands, fingers): None, normal Lower (legs, knees, ankles, toes): None, normal, Trunk Movements Neck, shoulders, hips: None, normal, Overall Severity Severity of abnormal movements (highest score from questions above): None, normal Incapacitation due to abnormal  movements: None, normal Patient's awareness of abnormal movements (rate only patient's report): Aware, no distress, Dental Status Current problems with teeth and/or dentures?: No Does patient usually wear dentures?: No  CIWA:    COWS:     Musculoskeletal: Strength & Muscle Tone: within normal limits Gait & Station: normal Patient leans: N/A  Psychiatric Specialty Exam: Review of Systems  All other systems reviewed and are negative.   Blood pressure 129/75, pulse 76, temperature 98.6 F (37 C), temperature source  Oral, resp. rate 20, height 5\' 6"  (1.676 m), weight 190 lb (86.183 kg), SpO2 100 %.Body mass index is 30.68 kg/(m^2).  General Appearance: Casual  Eye Contact::  Good  Speech:  Clear and Coherent  Volume:  Normal  Mood:  Euthymic  Affect:  Blunt  Thought Process:  Goal Directed  Orientation:  Full (Time, Place, and Person)  Thought Content:  WDL  Suicidal Thoughts:  No  Homicidal Thoughts:  No  Memory:  Immediate;   Fair Recent;   Fair Remote;   Fair  Judgement:  Poor  Insight:  Shallow  Psychomotor Activity:  Normal  Concentration:  Fair  Recall:  [place]: Fair  Akathisia:  No  Handed:  Right  AIMS (if indicated):     Assets:  Communication Skills Desire for Improvement Financial Resources/Insurance Housing Resilience Social Support  ADL's:  Intact  Cognition: WNL  Sleep:  Number of Hours: 7.5   Treatment Plan Summary: Daily contact with patient to assess and evaluate symptoms and progress in treatment and Medication management   Melanie Bell is a 25 year old female with a history of schizoaffective disorder admitted for worsening of depression and suicide attempt by swallowing an AA battery, an earing and a screw.   1. Suicidal ideation. This has resolved. The patient is able to contract for safety. She is forward thinking and optimistic about the future.    2. Mood and psychosis. We continued Prolixin 5mg  po TID and  Latuda 120mg  po daily for psychosis, Trileptal 150mg  po BID for mood stabilization, and Effexor XR 150mg  po daily for depression.   3. Hypertension. She is on amlodipine 2.5mg  po daily.  4. Constipation. She is on bowel regimen.   5. Hypothyroidism. She is on Synthroid 51mcg po daily  6. Smoking. Nicotine patch is available.  7. Insomnia. She responded well to trazodone.  8. Social. She is an incompetent adult. Her Kara Dies is the guardian. She lives in [place].  9. Metabolic syndrome. Lipid panel, hemoglobin A1c, TSH, and PRL were checked during previous admission and were normal. Prolactin 15.   10. Foreign body ingestion. There are three foreign objects present on X-ray. The AA battery was injected on 3/15. She will continue bowel regimen and Lactuose. Input from GI and surgery is greatly appreciated. After talking to Dr. [name], GI, we progressed her diet to soft. There will be no surgical intervention unless there is perforation/peritonitis. X-ray on 08/28/2015 shows slight movement of foreign objects along descending colon.  11. Disposition. She will be discharged back to her group home and follow up with her regular psychiatrist at [facility] for medication management. She will continue in the day program.  Elvin So, MD 08/29/2015, 10:13 AM

## 2015-08-30 NOTE — Progress Notes (Signed)
Pt has been pleasant and cooperative for the most part. Some redirecting needed at times. Pt noted to be seclusive to her room most of the AM, refusing breakfast. Pt's mood and affect has been depressed. Pt denies SI and A/V hallucinations.

## 2015-08-30 NOTE — Progress Notes (Signed)
Patient ID: Melanie Bell, female   DOB: [birth year], 25 y.o.   MRN: [name]  [facility] MD Progress Note  08/30/2015 10:51 AM JALEEYA SATURDAY  MRN:  [name]  Subjective:  Melanie Bell is a 25 year old female with a history of schizoaffective disorder and a history for swallowing things. Patient was admitted after she swallowed 3 objects which included a screw, yielding an and AA battery on 15th of March. She is been in the hospital since then waiting to expelled these objects. She has had an x-ray and there has been some anterograde movement. Patient seen in her room this morning and states she is very sleepy. Overall she has been cooperative on the unit. She had 1 bowel movement yesterday and no foreign objects are noted. She's been taking her medications and being compliant with unit rules. States that she is ready for discharge and bored of being in the hospital waiting for these objects to be expelled. Denies any suicidal thoughts or psychotic symptoms.  Principal Problem: Schizoaffective disorder, bipolar type ([place]) Diagnosis:   Patient Active Problem List   Diagnosis Date Noted  . Foreign body alimentary tract [T18.9XXA]   . Foreign body ingestion [T18.9XXA] 07/31/2015  . Schizoaffective disorder, bipolar type ([place]) [F25.0] 11/06/2014  . Tobacco use disorder [F17.200] 09/30/2014  . Hypothyroidism [E03.9] 09/29/2014  . Hypertension [I10] 09/29/2014  . Constipation [K59.00] 09/29/2014   Total Time spent with patient: 20 minutes  Past Psychiatric History:  schizoaffective disorder  Medical History:  Past Medical History  Diagnosis Date  . Hallucinations 09/30/2014    Sizoaffective  . Depression   . Anxiety   . Hypertension   . Tardive dyskinesia 10/2014    recent onset  . GERD (gastroesophageal reflux disease)   . Hyperlipidemia     Past Surgical History  Procedure Laterality Date  . Wisdom tooth extraction    . Esophagogastroduodenoscopy N/A 11/28/2014    Procedure:  ESOPHAGOGASTRODUODENOSCOPY (EGD);  Surgeon: [name], MD;  Location: [facility] ENDOSCOPY;  Service: Endoscopy;  Laterality: N/A;  . Abdominal surgery      "years ago" to remove foreign objects  . Breast lumpectomy     Family History:  Family History  Problem Relation Age of Onset  . Depression Mother   . Hypertension Mother    Family Psychiatric  History:  mother with depression.  History  Alcohol Use No     History  Drug Use No    Social History   Social History  . Marital Status: Single    Spouse Name: N/A  . Number of Children: N/A  . Years of Education: N/A   Social History Main Topics  . Smoking status: Current Every Day Smoker -- 0.50 packs/day for 3 years    Types: Cigarettes  . Smokeless tobacco: Never Used  . Alcohol Use: No  . Drug Use: No  . Sexual Activity: No   Other Topics Concern  . None   Social History Narrative   Additional Social History:    Pain Medications: See PTA Prescriptions: See PTA Over the Counter: See PTA History of alcohol / drug use?: No history of alcohol / drug abuse Longest period of sobriety (when/how long): No history of use                    Sleep: Fair  Appetite:  Fair  Current Medications: Current Facility-Administered Medications  Medication Dose Route Frequency Provider Last Rate Last Dose  . acetaminophen (TYLENOL) tablet 650 mg  650 mg Oral Q6H PRN [name], MD   650 mg at 08/27/15 2003  . alum & mag hydroxide-simeth (MAALOX/MYLANTA) 200-200-20 MG/5ML suspension 30 mL  30 mL Oral Q4H PRN [name], MD   30 mL at 08/25/15 1951  . amLODipine (NORVASC) tablet 2.5 mg  2.5 mg Oral Daily [name], MD   2.5 mg at 08/30/15 0818  . benztropine (COGENTIN) tablet 1 mg  1 mg Oral QHS [name], MD   1 mg at 08/29/15 2140  . docusate sodium (COLACE) capsule 200 mg  200 mg Oral BID [name], MD   200 mg at 08/30/15 0818  . fluPHENAZine (PROLIXIN) tablet 5 mg  5 mg Oral 3 times per day [name], MD   5 mg at 08/30/15 0820  . lactulose (CHRONULAC) 10 GM/15ML solution 30 g  30 g Oral TID [name], MD   30 g at 08/30/15 0816  . levothyroxine (SYNTHROID, LEVOTHROID) tablet 75 mcg  75 mcg Oral QAC breakfast [name], MD   75 mcg at 08/30/15 0645  . lurasidone (LATUDA) tablet 120 mg  120 mg Oral Q breakfast [name], MD   120 mg at 08/30/15 0818  . magnesium hydroxide (MILK OF MAGNESIA) suspension 30 mL  30 mL Oral Daily PRN [name], MD   30 mL at 08/24/15 1450  . milk and molasses enema  1 enema Rectal Once [name], MD   250 mL at 08/16/15 1230  . nicotine (NICODERM CQ - dosed in mg/24 hours) patch 21 mg  21 mg Transdermal Daily [name] [name], MD   21 mg at 08/30/15 0842  . Oxcarbazepine (TRILEPTAL) tablet 150 mg  150 mg Oral BID [name], MD   150 mg at 08/30/15 0819  . polyethylene glycol (MIRALAX / GLYCOLAX) packet 17 g  17 g Oral BID [name], MD   17 g at 08/30/15 0820  . senna (SENOKOT) tablet 17.2 mg  2 tablet Oral QHS [name], MD   17.2 mg at 08/29/15 2139  . traZODone (DESYREL) tablet 100 mg  100 mg Oral QHS [name], MD   100 mg at 08/29/15 2139  . venlafaxine XR (EFFEXOR-XR) 24 hr capsule 150 mg  150 mg Oral Q breakfast [name], MD   150 mg at 08/30/15 [id number]    Lab Results: No results found for this or any previous visit (from the past 48 hour(s)).  Blood Alcohol level:  Lab Results  Component Value Date   ETH <5 07/30/2015   ETH <5 07/13/2015    Physical Findings: AIMS: Facial and Oral Movements Muscles of Facial Expression: None, normal Lips and Perioral Area: None, normal Jaw: None, normal Tongue: None, normal,Extremity Movements Upper (arms, wrists, hands, fingers): None, normal Lower (legs, knees, ankles, toes): None, normal, Trunk Movements Neck, shoulders, hips: None, normal, Overall Severity Severity of abnormal movements (highest score from questions above): None,  normal Incapacitation due to abnormal movements: None, normal Patient's awareness of abnormal movements (rate only patient's report): Aware, no distress, Dental Status Current problems with teeth and/or dentures?: No Does patient usually wear dentures?: No  CIWA:    COWS:     Musculoskeletal: Strength & Muscle Tone: within normal limits Gait & Station: normal Patient leans: N/A  Psychiatric Specialty Exam: Review of Systems  All other systems reviewed and are negative.   Blood pressure 116/73, pulse  76, temperature 98.6 F (37 C), temperature source Oral, resp. rate 20, height 5\' 6"  (1.676 m), weight 190 lb (86.183 kg), SpO2 100 %.Body mass index is 30.68 kg/(m^2).  General Appearance: Casual  Eye Contact::  Good  Speech:  Clear and Coherent  Volume:  Normal  Mood:  Euthymic  Affect:  Blunt  Thought Process:  Goal Directed  Orientation:  Full (Time, Place, and Person)  Thought Content:  WDL  Suicidal Thoughts:  No  Homicidal Thoughts:  No  Memory:  Immediate;   Fair Recent;   Fair Remote;   Fair  Judgement:  Poor  Insight:  Shallow  Psychomotor Activity:  Normal  Concentration:  Fair  Recall:  [place]: Fair  Akathisia:  No  Handed:  Right  AIMS (if indicated):     Assets:  Communication Skills Desire for Improvement Financial Resources/Insurance Housing Resilience Social Support  ADL's:  Intact  Cognition: WNL  Sleep:  Number of Hours: 7.5   Treatment Plan Summary: Daily contact with patient to assess and evaluate symptoms and progress in treatment and Medication management   Melanie Bell is a 25 year old female with a history of schizoaffective disorder admitted for worsening of depression and suicide attempt by swallowing an AA battery, an earing and a screw.   1. Suicidal ideation. This has resolved. The patient is able to contract for safety. She is forward thinking and optimistic about the future.    2. Mood and  psychosis. We continued Prolixin 5mg  po TID and Latuda 120mg  po daily for psychosis, Trileptal 150mg  po BID for mood stabilization, and Effexor XR 150mg  po daily for depression.   3. Hypertension. She is on amlodipine 2.5mg  po daily.  4. Constipation. She is on bowel regimen.   5. Hypothyroidism. She is on Synthroid 4mcg po daily  6. Smoking. Nicotine patch is available.  7. Insomnia. She responded well to trazodone.  8. Social. She is an incompetent adult. Her Kara Dies is the guardian. She lives in [place].  9. Metabolic syndrome. Lipid panel, hemoglobin A1c, TSH, and PRL were checked during previous admission and were normal. Prolactin 15.   10. Foreign body ingestion. There are three foreign objects present on X-ray. The AA battery was injected on 3/15. She will continue bowel regimen and Lactuose. Input from GI and surgery is greatly appreciated. After talking to Dr. [name], GI, we progressed her diet to soft. There will be no surgical intervention unless there is perforation/peritonitis. X-ray on 08/28/2015 shows slight movement of foreign objects along descending colon.  11. Disposition. She will be discharged back to her group home and follow up with her regular psychiatrist at [facility] for medication management. She will continue in the day program.  Elvin So, MD 08/30/2015, 10:51 AM

## 2015-08-30 NOTE — [facility] Group Notes (Signed)
[place] LCSW Group Therapy  08/30/2015 10:55 AM  Type of Therapy:  Group Therapy  Participation Level:  Did Not Attend  Modes of Intervention:  Activity, Education, Exploration, Socialization and Support  Summary of Progress/Problems: Mindfulness: Patient discussed mindfulness and relaxing techniques and why they are beneficial. Pt discussed ways to incorporate mindfulness in their lives. Pt practiced a mindfulness techique and discussed how it made them feel.   [place] MSW, [place]  08/30/2015, 10:55 AM

## 2015-08-31 ENCOUNTER — Inpatient Hospital Stay: Payer: MEDICAID

## 2015-08-31 IMAGING — CR DG ABDOMEN ACUTE W/ 1V CHEST
1 series · 4 of 4 positions shown · non-contrast
Comparison: [DATE] and previous

CLINICAL DATA: Followup ingested foreign objects.

EXAM:
DG ABDOMEN ACUTE W/ 1V CHEST

[Series 1: dg abd acute w/chest · 0.14mm/px · 4 of 4 slices shown]
[im 1/4]
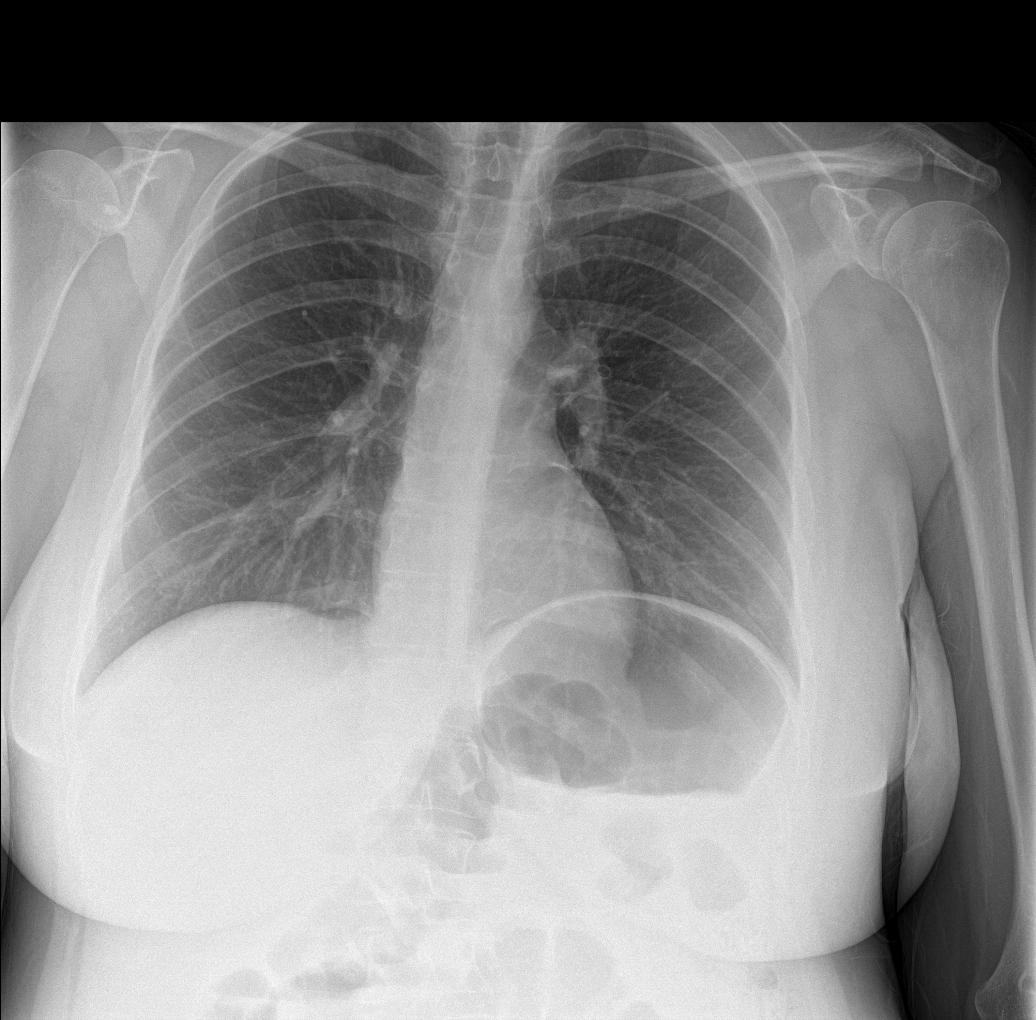
[im 2/4]
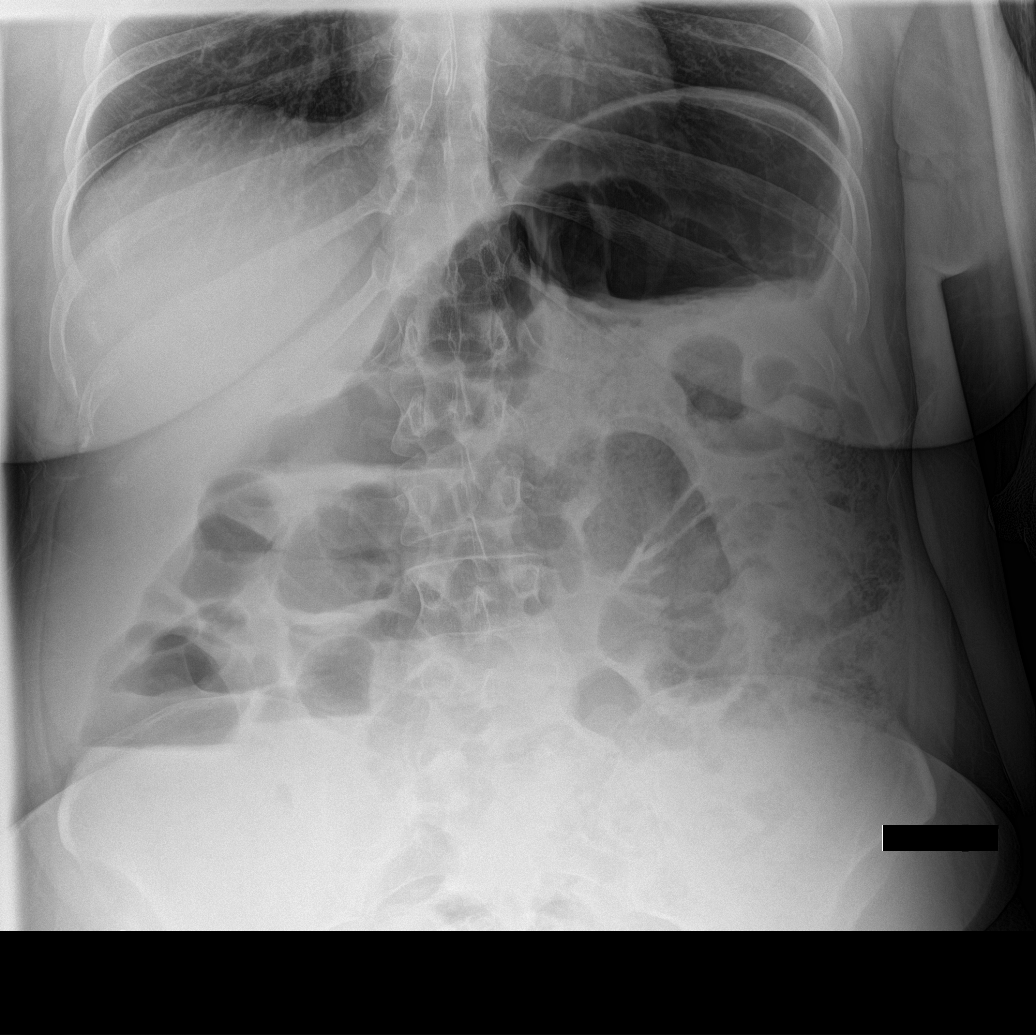
[im 3/4]
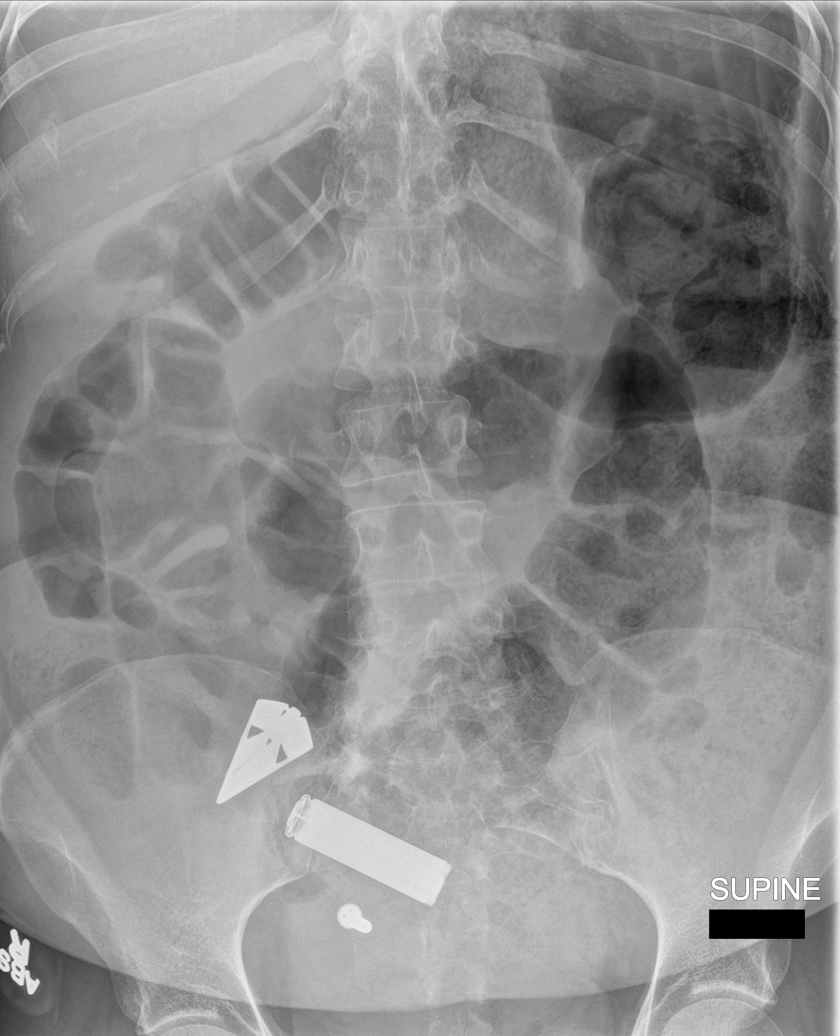
[im 4/4]
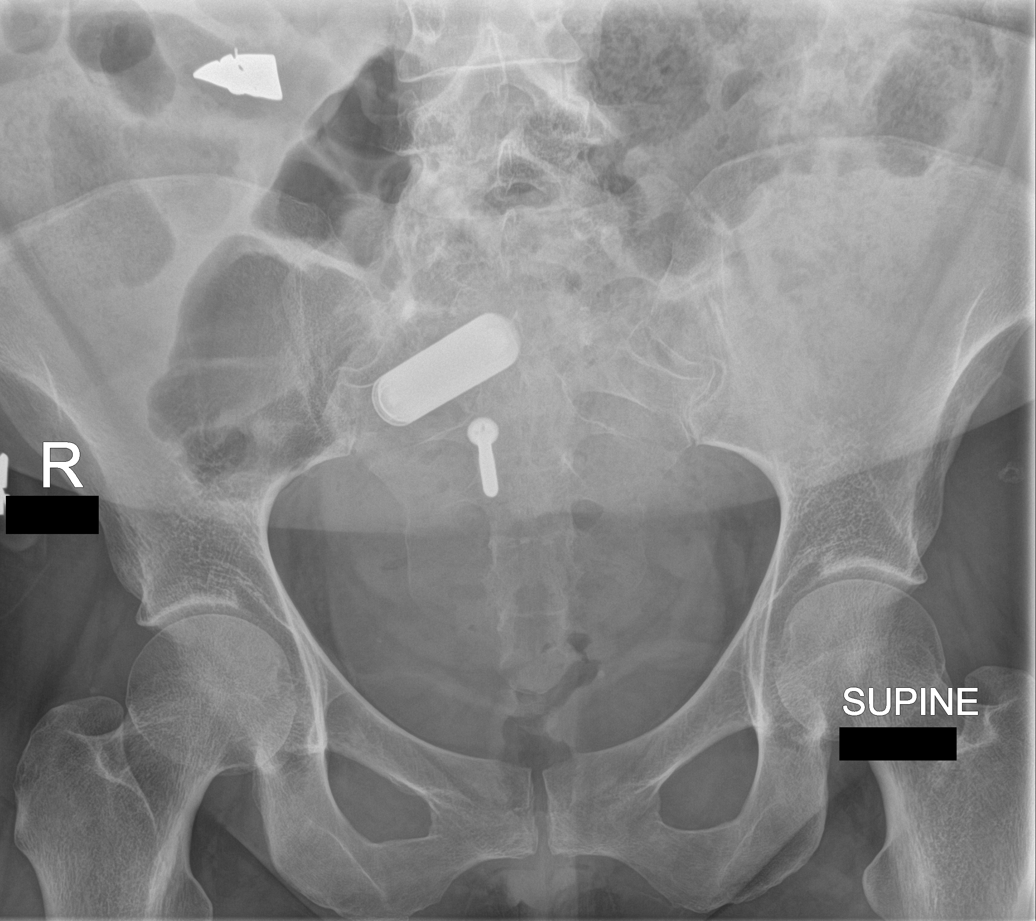

[4 of 4 positions shown; findings below may reference images not displayed]

FINDINGS: One-view chest shows normal heart and mediastinal shadows. The lungs
are clear. No effusions. No bony abnormalities.

Two view abdominal films show the 3 ingested objects still within
the patient. The triangular object behaves as if it is in the cecum.
The tubular object and the screw behave as if there are in the
sigmoid colon.
IMPRESSION: Three ingested foreign objects still visible as described above.

## 2015-08-31 NOTE — Progress Notes (Signed)
[facility] MD Progress Note  08/31/2015 1:33 PM Melanie Bell  MRN:  [name]  Subjective:  Melanie Bell had a bowel movement this morning but reportedly she did not pass any foreign objects.  We'll continue to monitor.   Principal Problem: Schizoaffective disorder, bipolar type ([place]) Diagnosis:   Patient Active Problem List   Diagnosis Date Noted  . Foreign body alimentary tract [T18.9XXA]   . Foreign body ingestion [T18.9XXA] 07/31/2015  . Schizoaffective disorder, bipolar type ([place]) [F25.0] 11/06/2014  . Tobacco use disorder [F17.200] 09/30/2014  . Hypothyroidism [E03.9] 09/29/2014  . Hypertension [I10] 09/29/2014  . Constipation [K59.00] 09/29/2014   Total Time spent with patient: 20 minutes  Past Psychiatric History: Schizoaffective disorder.  Past Medical History:  Past Medical History  Diagnosis Date  . Hallucinations 09/30/2014    Sizoaffective  . Depression   . Anxiety   . Hypertension   . Tardive dyskinesia 10/2014    recent onset  . GERD (gastroesophageal reflux disease)   . Hyperlipidemia     Past Surgical History  Procedure Laterality Date  . Wisdom tooth extraction    . Esophagogastroduodenoscopy N/A 11/28/2014    Procedure: ESOPHAGOGASTRODUODENOSCOPY (EGD);  Surgeon: [name], MD;  Location: [facility] ENDOSCOPY;  Service: Endoscopy;  Laterality: N/A;  . Abdominal surgery      "years ago" to remove foreign objects  . Breast lumpectomy     Family History:  Family History  Problem Relation Age of Onset  . Depression Mother   . Hypertension Mother    Family Psychiatric  History: Mother with depression. Social History:  History  Alcohol Use No     History  Drug Use No    Social History   Social History  . Marital Status: Single    Spouse Name: N/A  . Number of Children: N/A  . Years of Education: N/A   Social History Main Topics  . Smoking status: Current Every Day Smoker -- 0.50 packs/day for 3 years    Types: Cigarettes  . Smokeless  tobacco: Never Used  . Alcohol Use: No  . Drug Use: No  . Sexual Activity: No   Other Topics Concern  . None   Social History Narrative   Additional Social History:    Pain Medications: See PTA Prescriptions: See PTA Over the Counter: See PTA History of alcohol / drug use?: No history of alcohol / drug abuse Longest period of sobriety (when/how long): No history of use                    Sleep: Fair  Appetite:  Good  Current Medications: Current Facility-Administered Medications  Medication Dose Route Frequency Provider Last Rate Last Dose  . acetaminophen (TYLENOL) tablet 650 mg  650 mg Oral Q6H PRN [name], MD   650 mg at 08/30/15 1811  . alum & mag hydroxide-simeth (MAALOX/MYLANTA) 200-200-20 MG/5ML suspension 30 mL  30 mL Oral Q4H PRN [name], MD   30 mL at 08/25/15 1951  . amLODipine (NORVASC) tablet 2.5 mg  2.5 mg Oral Daily [name], MD   2.5 mg at 08/31/15 0931  . benztropine (COGENTIN) tablet 1 mg  1 mg Oral QHS [name], MD   1 mg at 08/30/15 2132  . docusate sodium (COLACE) capsule 200 mg  200 mg Oral BID [name], MD   200 mg at 08/31/15 0930  . fluPHENAZine (PROLIXIN) tablet 5 mg  5 mg Oral 3 times  per day [name], MD   5 mg at 08/31/15 0651  . lactulose (CHRONULAC) 10 GM/15ML solution 30 g  30 g Oral TID [name], MD   30 g at 08/31/15 0933  . levothyroxine (SYNTHROID, LEVOTHROID) tablet 75 mcg  75 mcg Oral QAC breakfast [name], MD   75 mcg at 08/31/15 0650  . lurasidone (LATUDA) tablet 120 mg  120 mg Oral Q breakfast [name], MD   120 mg at 08/31/15 0931  . magnesium hydroxide (MILK OF MAGNESIA) suspension 30 mL  30 mL Oral Daily PRN [name], MD   30 mL at 08/24/15 1450  . milk and molasses enema  1 enema Rectal Once [name], MD   250 mL at 08/16/15 1230  . nicotine (NICODERM CQ - dosed in mg/24 hours) patch 21 mg  21 mg Transdermal Daily [name] [name], MD   21 mg at  08/31/15 0935  . Oxcarbazepine (TRILEPTAL) tablet 150 mg  150 mg Oral BID [name], MD   150 mg at 08/31/15 0932  . polyethylene glycol (MIRALAX / GLYCOLAX) packet 17 g  17 g Oral BID [name], MD   17 g at 08/31/15 0933  . senna (SENOKOT) tablet 17.2 mg  2 tablet Oral QHS [name], MD   17.2 mg at 08/30/15 2132  . traZODone (DESYREL) tablet 100 mg  100 mg Oral QHS [name], MD   100 mg at 08/30/15 2131  . venlafaxine XR (EFFEXOR-XR) 24 hr capsule 150 mg  150 mg Oral Q breakfast [name], MD   150 mg at 08/31/15 [id number]    Lab Results: No results found for this or any previous visit (from the past 36 hour(s)).  Blood Alcohol level:  Lab Results  Component Value Date   ETH <5 07/30/2015   ETH <5 07/13/2015    Physical Findings: AIMS: Facial and Oral Movements Muscles of Facial Expression: None, normal Lips and Perioral Area: None, normal Jaw: None, normal Tongue: None, normal,Extremity Movements Upper (arms, wrists, hands, fingers): None, normal Lower (legs, knees, ankles, toes): None, normal, Trunk Movements Neck, shoulders, hips: None, normal, Overall Severity Severity of abnormal movements (highest score from questions above): None, normal Incapacitation due to abnormal movements: None, normal Patient's awareness of abnormal movements (rate only patient's report): Aware, no distress, Dental Status Current problems with teeth and/or dentures?: No Does patient usually wear dentures?: No  CIWA:    COWS:     Musculoskeletal: Strength & Muscle Tone: within normal limits Gait & Station: normal Patient leans: N/A  Psychiatric Specialty Exam: Review of Systems  All other systems reviewed and are negative.   Blood pressure 120/69, pulse 79, temperature 98.6 F (37 C), temperature source Oral, resp. rate 20, height 5\' 6"  (1.676 m), weight 86.183 kg (190 lb), SpO2 100 %.Body mass index is 30.68 kg/(m^2).  General Appearance: Casual  Eye Contact::   Good  Speech:  Clear and Coherent  Volume:  Normal  Mood:  Anxious  Affect:  Appropriate  Thought Process:  Goal Directed  Orientation:  Full (Time, Place, and Person)  Thought Content:  WDL  Suicidal Thoughts:  No  Homicidal Thoughts:  No  Memory:  Immediate;   Fair Recent;   Fair Remote;   Fair  Judgement:  Poor  Insight:  Lacking  Psychomotor Activity:  Normal  Concentration:  Fair  Recall:  [employer] [place]  Language:  Fair  Akathisia:  No  Handed:  Right  AIMS (if indicated):     Assets:  Communication Skills Desire for Improvement Financial Resources/Insurance Housing Resilience Social Support  ADL's:  Intact  Cognition: WNL  Sleep:  Number of Hours: 8   Treatment Plan Summary: Daily contact with patient to assess and evaluate symptoms and progress in treatment and Medication management   Melanie Bell is a 25 year old female with a history of schizoaffective disorder admitted for worsening of depression and suicide attempt by swallowing an AA battery, an earing and a screw.   1. Suicidal ideation. This has resolved. The patient is able to contract for safety. She is forward thinking and optimistic about the future.    2. Mood and psychosis. We continued Prolixin 5mg  po TID and Latuda 120mg  po daily for psychosis, Trileptal 150mg  po BID for mood stabilization, and Effexor XR 150mg  po daily for depression.   3. Hypertension. She is on amlodipine 2.5mg  po daily.  4. Constipation. She is on bowel regimen.   5. Hypothyroidism. She is on Synthroid 68mcg po daily  6. Smoking. Nicotine patch is available.  7. Insomnia. She responded well to trazodone.  8. Social. She is an incompetent adult. Her Kara Dies is the guardian. She lives in [place].  9. Metabolic syndrome. Lipid panel, hemoglobin A1c, TSH, and PRL were checked during previous admission and were normal. Prolactin 15.   10. Foreign body ingestion. There are three foreign objects present on  X-ray. The AA battery was injected on 3/15. She will continue bowel regimen and Lactuose. Input from GI and surgery is greatly appreciated. After talking to Dr. [name], GI, we progressed her diet to soft. There will be no surgical intervention unless there is perforation/peritonitis. X-ray on 08/28/2015 shows slight movement of foreign objects along descending colon.  11. Disposition. She will be discharged back to her group home and follow up with her regular psychiatrist at [facility] for medication management. She will continue in the day program.  Orson Slick, MD 08/31/2015, 1:33 PM

## 2015-08-31 NOTE — Progress Notes (Signed)
Recreation Therapy Notes  Date: 04.17.17 Time: 1:00 pm Location: Craft Room  Group Topic: Self-expression  Goal Area(s) Addresses:  Patient will be able to identify a color that represents each emotion. Patient will verbalize benefit of using art as a means of self-expression. Patient will verbalize one emotion experienced while participating in activity.  Behavioral Response: Attentive, Interactive  Intervention: The Colors Within Me  Activity: Patients were given a blank face worksheet and instructed to pick a color for each emotion they were experiencing and to show on the face how much of that emotion they are experiencing.  Education: LRT educated patients on other forms of self-expression.  Education Outcome: Acknowledges education/In group clarification offered  Clinical Observations/Feedback: Patient completed activity by picking a color for each emotion she was experiencing and showing on the face worksheet how much of that emotion she was experiencing. Patient contributed to group discussion by stating some of the emotions she was experiencing, and that she thought her emotions were dynamic.  Leonette Monarch, LRT/CTRS 08/31/2015 2:28 PM

## 2015-08-31 NOTE — [facility] Group Notes (Signed)
[place] Group Notes:  (Nursing/MHT/Case Management/Adjunct)  Date:  08/31/2015  Time:  9:15 PM  Type of Therapy:  Group Therapy  Participation Level:  Active  Participation Quality:  Appropriate  Affect:  Appropriate  Cognitive:  Appropriate  Insight:  Appropriate  Engagement in Group:  Engaged  Modes of Intervention:  Discussion  Summary of Progress/Problems:  Melanie Bell 08/31/2015, 9:15 PM

## 2015-08-31 NOTE — Plan of Care (Signed)
Problem: Alteration in thought process Goal: STG-Patient is able to sleep at least 6 hours per night Outcome: Progressing Patient slept 7.5 hours previous night

## 2015-08-31 NOTE — Progress Notes (Signed)
D: Patient stated slept good last night .Stated appetite is good and energy level  Is normal. Stated concentration is good . Stated on Depression scale 0 , hopeless 0 and 0 anxiety .( low 0-10 high) Denies suicidal  homicidal ideations  .  No auditory hallucinations  No pain concerns . Appropriate ADL'S. Interacting with peers and staff. Voice of going to groups to learn better coping skills  Patient seen by   Dr. Colman Cater came by checked patient abdomen . Patient voice concerns around  Gut pain .  A: Encourage patient participation with unit programming . Instruction  Given on  Medication , verbalize understanding. R: Voice no other concerns. Staff continue to monitor .

## 2015-08-31 NOTE — Plan of Care (Signed)
Problem: Alteration in thought process Goal: STG-Patient is able to discuss thoughts with staff Outcome: Progressing Attending unit programing

## 2015-08-31 NOTE — Progress Notes (Signed)
D: Patient appears bright on approach. She denies SIHI/AVH. She stated she did not have a bowel movement today. She stated she was having some abdomen pain but nothing unbearable. She rated it at 7.  A: Medication was given with education. Encouragement was provided. Patient was education notify staff if pain intensified.  R:Patient was compliant with medication. She has remained calm and cooperative. Safety maintained with 15 min checks.

## 2015-08-31 NOTE — Consult Note (Signed)
Subjective: Patient seen for multiple ingested foreign bodies. Patient had some abdominal discomfort/pain in the left side of the abdomen last night/early this morning however this for the most part has resolved. She denies any nausea or vomiting. She did have a bowel movement earlier today.  Objective: Vital signs in last 24 hours: Pulse Rate:  [79] 79 (04/17 0700) BP: (120)/(69) 120/69 mmHg (04/17 0700) Blood pressure 120/69, pulse 79, temperature 98.6 F (37 C), temperature source Oral, resp. rate 20, height 5\' 6"  (1.676 m), weight 86.183 kg (190 lb), SpO2 100 %.   Intake/Output from previous day:    Intake/Output this shift: Total I/O In: 480 [P.O.:480] Out: -    General appearance:  Well-appearing [age] female no distress Resp:  Laterally clear to auscultation Cardio:  Regular rate and rhythm without rub or gallop GI:  Soft minimal discomfort palpation left lower quadrant. Bowel sounds positive normoactive. There is no rebound. Extremities:  No clubbing cyanosis or edema   Lab Results: No results found for this or any previous visit (from the past 24 hour(s)).   No results for input(s): WBC, HGB, HCT, PLT in the last 72 hours. BMET No results for input(s): NA, K, CL, CO2, GLUCOSE, BUN, CREATININE, CALCIUM in the last 72 hours. LFT No results for input(s): PROT, ALBUMIN, AST, ALT, ALKPHOS, BILITOT, BILIDIR, IBILI in the last 72 hours. PT/INR No results for input(s): LABPROT, INR in the last 72 hours. Hepatitis Panel No results for input(s): HEPBSAG, HCVAB, HEPAIGM, HEPBIGM in the last 72 hours. C-Diff No results for input(s): CDIFFTOX in the last 72 hours. No results for input(s): CDIFFPCR in the last 72 hours.   Studies/Results: Dg Abd Acute W/chest  08/31/2015  CLINICAL DATA:  Followup ingested foreign objects. EXAM: DG ABDOMEN ACUTE W/ 1V CHEST COMPARISON:  08/28/2015 and previous FINDINGS: One-view chest shows normal heart and mediastinal shadows. The lungs are  clear. No effusions. No bony abnormalities. Two view abdominal films show the 3 ingested objects still within the patient. The triangular object behaves as if it is in the cecum. The tubular object and the screw behave as if there are in the sigmoid colon. IMPRESSION: Three ingested foreign objects still visible as described above. Electronically Signed   By: Nelson Chimes M.D.   On: 08/31/2015 16:00    Scheduled Inpatient Medications:   . amLODipine  2.5 mg Oral Daily  . benztropine  1 mg Oral QHS  . docusate sodium  200 mg Oral BID  . fluPHENAZine  5 mg Oral 3 times per day  . lactulose  30 g Oral TID  . levothyroxine  75 mcg Oral QAC breakfast  . lurasidone  120 mg Oral Q breakfast  . milk and molasses  1 enema Rectal Once  . nicotine  21 mg Transdermal Daily  . Oxcarbazepine  150 mg Oral BID  . polyethylene glycol  17 g Oral BID  . senna  2 tablet Oral QHS  . traZODone  100 mg Oral QHS  . venlafaxine XR  150 mg Oral Q breakfast    Continuous Inpatient Infusions:     PRN Inpatient Medications:  acetaminophen, alum & mag hydroxide-simeth, magnesium hydroxide  Miscellaneous:   Assessment:  1. Multiple ingested foreign bodies. These have slowly been working await through the GI tract. This likely speaks to the redundancy of her colon which would make removal by any method more difficult. Patient is tolerating a soft diet and having bowel movements currently.  Plan:  1. Continue  current.  [name] MD 08/31/2015, 5:31 PM

## 2015-09-01 NOTE — [facility] Group Notes (Signed)
[place] [place] Group Notes:  (Nursing/MHT/Case Management/Adjunct)  Date:  09/01/2015  Time:  2:10 PM  Type of Therapy:  Psychoeducational Skills  Participation Level:  Active  Participation Quality:  Appropriate, Attentive and Sharing  Affect:  Appropriate  Cognitive:  Alert and Appropriate  Insight:  Appropriate  Engagement in Group:  Engaged  Modes of Intervention:  Discussion, Education and Support  Summary of Progress/Problems:  Melanie Bell 09/01/2015, 2:10 PM

## 2015-09-01 NOTE — Progress Notes (Signed)
Recreation Therapy Notes  Date: 04.18.17 Time: 3:00 pm Location: Craft Room  Group Topic: Goal Setting  Goal Area(s) Addresses:  Patient will write at least one goal. Patient will write at least one obstacle.  Behavioral Response: Attentive, Interactive  Intervention: Recovery Goal Chart  Activity: Patients were instructed to make a Recovery Goal chart including goals, obstacles, the date they started working on their goals, and the date they will achieve their goals.  Education: LRT educated patients on healthy ways to celebrate reaching their goals.  Education Outcome: Acknowledges education/In group clarification offered  Clinical Observations/Feedback: Patient completed activity by writing down goals and obstacles. Patient did not contribute to group discussion.  Leonette Monarch, LRT/CTRS 09/01/2015 4:36 PM

## 2015-09-01 NOTE — Progress Notes (Signed)
D: Patient stated slept good last night .Stated appetite is good and energy level  Is normal. Stated concentration is good . Stated on Depression scale 4 , hopeless 1 and anxiety 3 .( low 0-10 high) Denies suicidal  homicidal ideations  .  No auditory hallucinations  No pain concerns . Appropriate ADL'S. Interacting with peers and staff.  Patient  Stated she  Is working  On coping  Depression and anxiety  Coping skills . Patient has not had any bowel movement this shift  A: Encourage patient participation with unit programming . Instruction  Given on  Medication , verbalize understanding. R: Voice no other concerns. Staff continue to monitor

## 2015-09-01 NOTE — Tx Team (Signed)
Interdisciplinary Treatment Plan Update (Adult)  Date:  09/01/2015 Time Reviewed:  3:53 PM  Progress in Treatment: Attending groups: Yes. Participating in groups:  Yes. Taking medication as prescribed:  Yes. Tolerating medication:  Yes. Family/Significant othe contact made:  Yes, individual(s) contacted:  mother and group home Patient understands diagnosis:  Yes. Discussing patient identified problems/goals with staff:  Yes. Medical problems stabilized or resolved:  No. and As evidenced by:  Pt has not passed the battery yet.  Denies suicidal/homicidal ideation: Yes. Issues/concerns per patient self-inventory:  Yes. Other:  New problem(s) identified: No, Describe:  NA  Discharge Plan or Barriers: Pt plans to return to group home and follow up with outpatient.    Reason for Continuation of Hospitalization: Hallucinations Medication stabilization Other; describe Pt swallowed objects  Comments: [name] is a 25 year old female with a history of schizoaffective disorder. She was briefly admitted to medical floor after she swallowed 3 foreign objects including a screw, AA battery and an earring. She was transferred to psychiatry before she passed them. The battery has been there since March 15. Apparently about that he should not stick around the gap for longer than a week according to GI note. She is now being followed by Dr. [name], GI. Surgery refuses to operate until there is perforation and/or peritonitis. The patient is no longer in need of psychiatric care but I could not discharge her to home given her medical condition. She is on liquid diet and laxatives.  [name] denies any symptoms of depression, anxiety, or psychosis. She denies any abdominal pain. In spite over multiple laxatives and lactulose she has not had a bowel movement yesterday. We will give mag citrate again today as her x-ray indicates massive amounts of stool in the bowels. She accepts medications and  tolerates them well. She participates well in programming.   Estimated length of stay: up to 7 days  New goal(s): NA  Review of initial/current patient goals per problem list:   1.  Goal(s): Patient will participate in aftercare plan * Met: Yes * Target date: at discharge * As evidenced by: Patient will participate within aftercare plan AEB aftercare provider and housing plan at discharge being identified. 08/18/15: Patient has a provider and will be referred to [facility] and has a group home which she can return to at discharge. Goal met.    2.  Goal (s): Patient will exhibit decreased depressive symptoms and suicidal ideations. * Met: Yes *  Target date: at discharge * As evidenced by: Patient will utilize self rating of depression at 3 or below and demonstrate decreased signs of depression or be deemed stable for discharge by MD. 08/18/15: Patient reports no SI and depression of 0. Goal met.   3.  Goal (s): Patient will demonstrate decreased symptoms of psychosis. * Met: No  *  Target date: at discharge * As evidenced by: Patient will not endorse signs of psychosis or be deemed stable for discharge by MD.  08/18/15: Patient reports no psychosis but is awaiting a batter to pass through her intestines before discharge.  08/20/15: Patient is still awaiting battery which she swallowed to pass and will discharge once this happens. MD is stating this could happen in the next couple of days.  08/25/15: Patient is continued to be monitored for movement of battery she swallowed and cannot be discharged at this time as this is a risk and remains in BMU as medical floor will not accept patient.  08/27/15: Patient is still waiting  for batter to discharge but no luck yet.  09/01/15: Patient has battery in her intestines that she swallowed and will discharge once she passes this per MD.  Attendees: Physician:   Orson Slick, MD 4/18/20173:53 PM  Nursing:   Polly Cobia, RN  4/18/20173:53 PM  Other:   Keene Breath, LCSW 4/18/20173:53 PM  Other:  Garald Braver, Ph D  4/18/20173:53 PM  Other:  Dossie Arbour, LCSW  4/18/20173:53 PM  Other: Everitt Amber, [place] 4/18/20173:53 PM  Other:  4/18/20173:53 PM  Other:  4/18/20173:53 PM  Other:  4/18/20173:53 PM  Other:  4/18/20173:53 PM  Other:   4/18/20173:53 PM   Scribe for Treatment Team:   Keene Breath, MSW, LCSW  09/01/2015, 3:53 PM

## 2015-09-01 NOTE — Progress Notes (Signed)
[facility] MD Progress Note  09/01/2015 2:50 PM Melanie Bell  MRN:  [id number]  Subjective:  [name] reports no symptoms of depression, anxiety or psychosis. She is not suicidal or homicidal. She tolerates medications well. She participates in programming to the best of her abilities. She is compliant with the recommendation of our GI consultant, Dr. [name]. She denies any somatic symptoms. There is no abdominal pain or cramping. She had a bowel movement again but no bacteria was found. Her abdominal x-ray from yesterday indicates some forward movement of foreign objects in the gut. There are concerns about the possibility of perforation if endoscopic removal is attempted. The patient has swallow an AA battery and month ago.  Principal Problem: Schizoaffective disorder, bipolar type ([place]) Diagnosis:   Patient Active Problem List   Diagnosis Date Noted  . Foreign body alimentary tract [T18.9XXA]   . Foreign body ingestion [T18.9XXA] 07/31/2015  . Schizoaffective disorder, bipolar type ([place]) [F25.0] 11/06/2014  . Tobacco use disorder [F17.200] 09/30/2014  . Hypothyroidism [E03.9] 09/29/2014  . Hypertension [I10] 09/29/2014  . Constipation [K59.00] 09/29/2014   Total Time spent with patient: 20 minutes  Past Psychiatric History: Schizoaffective disorder.  Past Medical History:  Past Medical History  Diagnosis Date  . Hallucinations 09/30/2014    Sizoaffective  . Depression   . Anxiety   . Hypertension   . Tardive dyskinesia 10/2014    recent onset  . GERD (gastroesophageal reflux disease)   . Hyperlipidemia     Past Surgical History  Procedure Laterality Date  . Wisdom tooth extraction    . Esophagogastroduodenoscopy N/A 11/28/2014    Procedure: ESOPHAGOGASTRODUODENOSCOPY (EGD);  Surgeon: [name], MD;  Location[facility] END[facility]COPY;  Service: Endoscopy;  Laterality: N/A;  . Abdominal surgery      "years ago" to remove foreign objects  . Breast lumpectomy     Family  History:  Family History  Problem Relation Age of Onset  . Depression Mother   . Hypertension Mother    Family Psychiatric  History: Mother with depression. Social History:  History  Alcohol Use No     History  Drug Use No    Social History   Social History  . Marital Status: Single    Spouse Name: N/A  . Number of Children: N/A  . Years of Education: N/A   Social History Main Topics  . Smoking status: Current Every Day Smoker -- 0.50 packs/day for 3 years    Types: Cigarettes  . Smokeless tobacco: Never Used  . Alcohol Use: No  . Drug Use: No  . Sexual Activity: No   Other Topics Concern  . None   Social History Narrative   Additional Social History:    Pain Medications: See PTA Prescriptions: See PTA Over the Counter: See PTA History of alcohol / drug use?: No history of alcohol / drug abuse Longest period of sobriety (when/how long): No history of use                    Sleep: Fair  Appetite:  Fair  Current Medications: Current Facility-Administered Medications  Medication Dose Route Frequency Provider Last Rate Last Dose  . acetaminophen (TYLENOL) tablet 650 mg  650 mg Oral Q6H PRN [name], MD   650 mg at 08/30/15 1811  . alum & mag hydroxide-simeth (MAALOX/MYLANTA) 200-200-20 MG/5ML suspension 30 mL  30 mL Oral Q4H PRN [name], MD   30 mL at 08/25/15 1951  . amLODipine (  NORVASC) tablet 2.5 mg  2.5 mg Oral Daily [name], MD   2.5 mg at 09/01/15 0903  . benztropine (COGENTIN) tablet 1 mg  1 mg Oral QHS [name], MD   1 mg at 08/31/15 2147  . docusate sodium (COLACE) capsule 200 mg  200 mg Oral BID [name], MD   200 mg at 09/01/15 0906  . fluPHENAZine (PROLIXIN) tablet 5 mg  5 mg Oral 3 times per day [name], MD   5 mg at 09/01/15 0657  . lactulose (CHRONULAC) 10 GM/15ML solution 30 g  30 g Oral TID [name], MD   30 g at 09/01/15 0903  . levothyroxine (SYNTHROID, LEVOTHROID) tablet 75 mcg  75 mcg  Oral QAC breakfast [name], MD   75 mcg at 09/01/15 0657  . lurasidone (LATUDA) tablet 120 mg  120 mg Oral Q breakfast [name], MD   120 mg at 09/01/15 0906  . magnesium hydroxide (MILK OF MAGNESIA) suspension 30 mL  30 mL Oral Daily PRN [name], MD   30 mL at 08/24/15 1450  . milk and molasses enema  1 enema Rectal Once [name], MD   250 mL at 08/16/15 1230  . nicotine (NICODERM CQ - dosed in mg/24 hours) patch 21 mg  21 mg Transdermal Daily Geremy Rister B Muranda Coye, MD   21 mg at 09/01/15 0908  . Oxcarbazepine (TRILEPTAL) tablet 150 mg  150 mg Oral BID [name], MD   150 mg at 09/01/15 0150  . polyethylene glycol (MIRALAX / GLYCOLAX) packet 17 g  17 g Oral BID [name], MD   17 g at 09/01/15 0909  . senna (SENOKOT) tablet 17.2 mg  2 tablet Oral QHS [name], MD   17.2 mg at 08/31/15 2147  . traZODone (DESYREL) tablet 100 mg  100 mg Oral QHS [name], MD   100 mg at 08/31/15 2147  . venlafaxine XR (EFFEXOR-XR) 24 hr capsule 150 mg  150 mg Oral Q breakfast [name], MD   150 mg at 09/01/15 [id number]    Lab Results: No results found for this or any previous visit (from the past 48 hour(s)).  Blood Alcohol level:  Lab Results  Component Value Date   ETH <5 07/30/2015   ETH <5 07/13/2015    Physical Findings: AIMS: Facial and Oral Movements Muscles of Facial Expression: None, normal Lips and Perioral Area: None, normal Jaw: None, normal Tongue: None, normal,Extremity Movements Upper (arms, wrists, hands, fingers): None, normal Lower (legs, knees, ankles, toes): None, normal, Trunk Movements Neck, shoulders, hips: None, normal, Overall Severity Severity of abnormal movements (highest score from questions above): None, normal Incapacitation due to abnormal movements: None, normal Patient's awareness of abnormal movements (rate only patient's report): Aware, no distress, Dental Status Current problems with teeth and/or dentures?:  No Does patient usually wear dentures?: No  CIWA:    COWS:     Musculoskeletal: Strength & Muscle Tone: within normal limits Gait & Station: normal Patient leans: N/A  Psychiatric Specialty Exam: Review of Systems  All other systems reviewed and are negative.   Blood pressure 106/66, pulse 67, temperature 98 F (36.7 C), temperature source Oral, resp. rate 20, height 5\' 6"  (1.676 m), weight 86.183 kg (190 lb), SpO2 100 %.Body mass index is 30.68 kg/(m^2).  General Appearance: Casual  Eye Contact::  Good  Speech:  Clear and Coherent  Volume:  Normal  Mood:  Euthymic  Affect:  Appropriate  Thought Process:  Goal Directed  Orientation:  Full (Time, Place, and Person)  Thought Content:  WDL  Suicidal Thoughts:  No  Homicidal Thoughts:  No  Memory:  Immediate;   Fair Recent;   Fair Remote;   Fair  Judgement:  Poor  Insight:  Lacking  Psychomotor Activity:  Normal  Concentration:  Fair  Recall:  [employer] of Knowledge:Fair  Language: Fair  Akathisia:  No  Handed:  Right  AIMS (if indicated):     Assets:  Communication Skills Desire for Improvement Financial Resources/Insurance Housing Resilience Social Support  ADL's:  Intact  Cognition: WNL  Sleep:  Number of Hours: 6.25   Treatment Plan Summary: Daily contact with patient to assess and evaluate symptoms and progress in treatment and Medication management   [name] is a 25 year old female with a history of schizoaffective disorder admitted for worsening of depression and suicide attempt by swallowing an AA battery, an earing and a screw.   1. Suicidal ideation. This has resolved. The patient is able to contract for safety. She is forward thinking and optimistic about the future.    2. Mood and psychosis. We continued Prolixin 5mg  po TID and Latuda 120mg  po daily for psychosis, Trileptal 150mg  po BID for mood stabilization, and Effexor XR 150mg  po daily for depression.   3. Hypertension. She is on  amlodipine 2.5mg  po daily.  4. Constipation. She is on bowel regimen.   5. Hypothyroidism. She is on Synthroid 63mcg po daily  6. Smoking. Nicotine patch is available.  7. Insomnia. She responded well to trazodone.  8. Social. She is an incompetent adult. Her Kara Dies is the guardian. She lives in [place].  9. Metabolic syndrome. Lipid panel, hemoglobin A1c, TSH, and PRL were checked during previous admission and were normal. Prolactin 15.   10. Foreign body ingestion. There are three foreign objects present on X-ray. The AA battery was injected on 3/15. She will continue bowel regimen and Lactuose. Input from GI and surgery is greatly appreciated. After talking to Dr. [name], GI, we progressed her diet to soft. There will be no surgical intervention unless there is perforation/peritonitis. X-ray on 08/31/2015 shows slight movement of foreign objects along the sigmoid colon.  11. Disposition. She will be discharged back to her group home and follow up with her regular psychiatrist at [facility] for medication management. She will continue in the day program.  Orson Slick, MD 09/01/2015, 2:50 PM

## 2015-09-01 NOTE — Progress Notes (Signed)
D: Pt rates her depression as a 4/10 this evening. She denies SI/HI/AVH at this time. Denies pain. Pt reports that her goal today as to "work on coping skills." Pt has not had a BM this shift. A: Emotional support and encouragement provided. Medications administered with education. q15 minute safety checks maintained. R: Pt remains free from harm. Will continue to monitor.

## 2015-09-01 NOTE — Plan of Care (Signed)
Problem: Diagnosis: Increased Risk For Suicide Attempt Goal: LTG-Patient Will Report Improved Mood and Deny Suicidal LTG (by discharge) Patient will report improved mood and deny suicidal ideation.  Outcome: Progressing Pt rates depression as a 4/10. Denies SI at this time.  Problem: Ineffective individual coping Goal: STG: Patient will remain free from self harm Outcome: Progressing Pt remains free from harm.

## 2015-09-01 NOTE — Plan of Care (Signed)
Problem: Alteration in thought process Goal: LTG-Patient verbalizes understanding importance med regimen (Patient verbalizes understanding of importance of medication regimen and need to continue outpatient care.)  Outcome: Progressing Compliant  With unit programing

## 2015-09-02 NOTE — Plan of Care (Signed)
Problem: Diagnosis: Increased Risk For Suicide Attempt Goal: STG-Patient Will Report Suicidal Feelings to Staff Outcome: Progressing Calm and cooperative. Interacting with peers and staff. Attends group. No voiced thoughts of hurting herself. Pt remains free from harm.

## 2015-09-02 NOTE — Progress Notes (Signed)
Pt reports no bowel movement this shift. Medications given as prescribed. Med and group compliant. Reports does not have the urge to swallow. Med and group compliant. Appropriate with staff and peers. Denies SI, HI, AVH. Encouragement and support offered. Pt receptive and remains safe on unit with q 15 min safety checks.

## 2015-09-02 NOTE — Progress Notes (Signed)
Calm and cooperative. Visible in [place]. Interacting with peers and staff. Med compliant. No bowel movement noted this shift. No thoughts of hurting herself. No c/o pain/discomfort noted.

## 2015-09-02 NOTE — Progress Notes (Signed)
Recreation Therapy Notes  Date: 04.19.17 Time: 1:00 pm Location: Craft Room  Group Topic: Self-esteem  Goal Area(s) Addresses:  Patient will identify at least one positive trait about self. Patient will identify at least one healthy coping skill.  Behavioral Response: Did not attend  Intervention: All About Me  Activity: Patients were instructed to make an All About Me pamphlet with positive traits about themselves, healthy coping skills, and their healthy support system.  Education: LRT educated patients on ways they can increase their self-esteem.  Education Outcome: Patient did not attend group.  Clinical Observations/Feedback: Patient did not attend group.  Leonette Monarch, LRT/CTRS 09/02/2015 2:32 PM

## 2015-09-02 NOTE — [facility] Group Notes (Signed)
[place] Group Notes:  (Nursing/MHT/Case Management/Adjunct)  Date:  09/02/2015  Time:  10:06 PM  Type of Therapy:  Group Therapy  Participation Level:  Active  Participation Quality:  Appropriate  Affect:  Appropriate  Cognitive:  Appropriate  Insight:  Appropriate  Engagement in Group:  Engaged  Modes of Intervention:  Discussion  Summary of Progress/Problems:  Melanie Bell 09/02/2015, 10:06 PM

## 2015-09-02 NOTE — [facility] Group Notes (Signed)
[facility] Group Notes:  (Nursing/MHT/Case Management/Adjunct)  Date:  09/02/2015  Time:  5:11 PM  Type of Therapy:  Psychoeducational Skills  Participation Level:  Did Not Attend    Melanie Bell M Moses Ellison 09/02/2015, 5:11 PM 

## 2015-09-02 NOTE — Progress Notes (Signed)
[facility] MD Progress Note  09/02/2015 2:14 PM Melanie Bell  MRN:  [id number]  Subjective:  There is no change in the patient's status. There are no somatic complaints. There were no bowel movements.  Principal Problem: Schizoaffective disorder, bipolar type ([place]) Diagnosis:   Patient Active Problem List   Diagnosis Date Noted  . Foreign body alimentary tract [T18.9XXA]   . Foreign body ingestion [T18.9XXA] 07/31/2015  . Schizoaffective disorder, bipolar type ([place]) [F25.0] 11/06/2014  . Tobacco use disorder [F17.200] 09/30/2014  . Hypothyroidism [E03.9] 09/29/2014  . Hypertension [I10] 09/29/2014  . Constipation [K59.00] 09/29/2014   Total Time spent with patient: 20 minutes  Past Psychiatric History: Schizoaffective disorder.  Past Medical History:  Past Medical History  Diagnosis Date  . Hallucinations 09/30/2014    Sizoaffective  . Depression   . Anxiety   . Hypertension   . Tardive dyskinesia 10/2014    recent onset  . GERD (gastroesophageal reflux disease)   . Hyperlipidemia     Past Surgical History  Procedure Laterality Date  . Wisdom tooth extraction    . Esophagogastroduodenoscopy N/A 11/28/2014    Procedure: ESOPHAGOGASTRODUODENOSCOPY (EGD);  Surgeon: [name], MD;  Location: [facility] ENDOSCOPY;  Service: Endoscopy;  Laterality: N/A;  . Abdominal surgery      "years ago" to remove foreign objects  . Breast lumpectomy     Family History:  Family History  Problem Relation Age of Onset  . Depression Mother   . Hypertension Mother    Family Psychiatric  History: Mother with depression. Social History:  History  Alcohol Use No     History  Drug Use No    Social History   Social History  . Marital Status: Single    Spouse Name: N/A  . Number of Children: N/A  . Years of Education: N/A   Social History Main Topics  . Smoking status: Current Every Day Smoker -- 0.50 packs/day for 3 years    Types: Cigarettes  . Smokeless tobacco: Never Used  .  Alcohol Use: No  . Drug Use: No  . Sexual Activity: No   Other Topics Concern  . None   Social History Narrative   Additional Social History:    Pain Medications: See PTA Prescriptions: See PTA Over the Counter: See PTA History of alcohol / drug use?: No history of alcohol / drug abuse Longest period of sobriety (when/how long): No history of use                    Sleep: Fair  Appetite:  Fair  Current Medications: Current Facility-Administered Medications  Medication Dose Route Frequency Provider Last Rate Last Dose  . acetaminophen (TYLENOL) tablet 650 mg  650 mg Oral Q6H PRN [name], MD   650 mg at 08/30/15 1811  . alum & mag hydroxide-simeth (MAALOX/MYLANTA) 200-200-20 MG/5ML suspension 30 mL  30 mL Oral Q4H PRN [name], MD   30 mL at 08/25/15 1951  . amLODipine (NORVASC) tablet 2.5 mg  2.5 mg Oral Daily [name], MD   2.5 mg at 09/02/15 0819  . benztropine (COGENTIN) tablet 1 mg  1 mg Oral QHS [name], MD   1 mg at 09/01/15 2125  . docusate sodium (COLACE) capsule 200 mg  200 mg Oral BID [name], MD   200 mg at 09/02/15 0819  . fluPHENAZine (PROLIXIN) tablet 5 mg  5 mg Oral 3 times per day [name],  MD   5 mg at 09/02/15 1402  . lactulose (CHRONULAC) 10 GM/15ML solution 30 g  30 g Oral TID [name], MD   30 g at 09/02/15 0819  . levothyroxine (SYNTHROID, LEVOTHROID) tablet 75 mcg  75 mcg Oral QAC breakfast [name], MD   75 mcg at 09/02/15 0713  . lurasidone (LATUDA) tablet 120 mg  120 mg Oral Q breakfast [name], MD   120 mg at 09/02/15 0818  . magnesium hydroxide (MILK OF MAGNESIA) suspension 30 mL  30 mL Oral Daily PRN [name], MD   30 mL at 08/24/15 1450  . milk and molasses enema  1 enema Rectal Once [name], MD   250 mL at 08/16/15 1230  . nicotine (NICODERM CQ - dosed in mg/24 hours) patch 21 mg  21 mg Transdermal Daily Timtohy Broski B Shayann Garbutt, MD   21 mg at 09/02/15 0820  .  Oxcarbazepine (TRILEPTAL) tablet 150 mg  150 mg Oral BID [name], MD   150 mg at 09/02/15 0819  . polyethylene glycol (MIRALAX / GLYCOLAX) packet 17 g  17 g Oral BID [name], MD   17 g at 09/02/15 0820  . senna (SENOKOT) tablet 17.2 mg  2 tablet Oral QHS [name], MD   17.2 mg at 09/01/15 2123  . traZODone (DESYREL) tablet 100 mg  100 mg Oral QHS [name], MD   100 mg at 09/01/15 2124  . venlafaxine XR (EFFEXOR-XR) 24 hr capsule 150 mg  150 mg Oral Q breakfast [name], MD   150 mg at 09/02/15 [id number]    Lab Results: No results found for this or any previous visit (from the past 48 hour(s)).  Blood Alcohol level:  Lab Results  Component Value Date   ETH <5 07/30/2015   ETH <5 07/13/2015    Physical Findings: AIMS: Facial and Oral Movements Muscles of Facial Expression: None, normal Lips and Perioral Area: None, normal Jaw: None, normal Tongue: None, normal,Extremity Movements Upper (arms, wrists, hands, fingers): None, normal Lower (legs, knees, ankles, toes): None, normal, Trunk Movements Neck, shoulders, hips: None, normal, Overall Severity Severity of abnormal movements (highest score from questions above): None, normal Incapacitation due to abnormal movements: None, normal Patient's awareness of abnormal movements (rate only patient's report): Aware, no distress, Dental Status Current problems with teeth and/or dentures?: No Does patient usually wear dentures?: No  CIWA:    COWS:     Musculoskeletal: Strength & Muscle Tone: within normal limits Gait & Station: normal Patient leans: N/A  Psychiatric Specialty Exam: Review of Systems  All other systems reviewed and are negative.   Blood pressure 127/76, pulse 79, temperature 98.8 F (37.1 C), temperature source Oral, resp. rate 20, height 5\' 6"  (1.676 m), weight 86.183 kg (190 lb), SpO2 100 %.Body mass index is 30.68 kg/(m^2).  General Appearance: Casual  Eye Contact::  Good  Speech:   Clear and Coherent  Volume:  Normal  Mood:  Anxious  Affect:  Appropriate  Thought Process:  Goal Directed  Orientation:  Full (Time, Place, and Person)  Thought Content:  WDL  Suicidal Thoughts:  No  Homicidal Thoughts:  No  Memory:  Immediate;   Fair Recent;   Fair Remote;   Fair  Judgement:  Poor  Insight:  Lacking  Psychomotor Activity:  Decreased  Concentration:  Fair  Recall:  [place]  Language: Fair  Akathisia:  No  Handed:  Right  AIMS (if indicated):     Assets:  Communication Skills Desire for Improvement Financial Resources/Insurance Housing Resilience Social Support  ADL's:  Intact  Cognition: WNL  Sleep:  Number of Hours: 7.45   Treatment Plan Summary: Daily contact with patient to assess and evaluate symptoms and progress in treatment and Medication management   Melanie Bell is a 25 year old female with a history of schizoaffective disorder admitted for worsening of depression and suicide attempt by swallowing an AA battery, an earing and a screw.   1. Suicidal ideation. This has resolved. The patient is able to contract for safety. She is forward thinking and optimistic about the future.    2. Mood and psychosis. We continued Prolixin 5mg  po TID and Latuda 120mg  po daily for psychosis, Trileptal 150mg  po BID for mood stabilization, and Effexor XR 150mg  po daily for depression.   3. Hypertension. She is on amlodipine 2.5mg  po daily.  4. Constipation. She is on bowel regimen.   5. Hypothyroidism. She is on Synthroid 78mcg po daily  6. Smoking. Nicotine patch is available.  7. Insomnia. She responded well to trazodone.  8. Social. She is an incompetent adult. Her Kara Dies is the guardian. She lives in [place].  9. Metabolic syndrome. Lipid panel, hemoglobin A1c, TSH, and PRL were checked during previous admission and were normal. Prolactin 15.   10. Foreign body ingestion. There are three foreign objects present on X-ray. The AA  battery was injected on 3/15. She will continue bowel regimen and Lactuose. Input from GI and surgery is greatly appreciated. After talking to Dr. [name], GI, we progressed her diet to soft. There will be no surgical intervention unless there is perforation/peritonitis. X-ray on 08/31/2015 shows slight movement of foreign objects along the sigmoid colon.  Orson Slick, MD 09/02/2015, 2:14 PM

## 2015-09-02 NOTE — [facility] Group Notes (Signed)
[facility] LCSW Group Therapy  09/02/2015 3:10 PM  Type of Therapy:  Group Therapy  Participation Level:  Did Not Attend  Modes of Intervention:  Discussion, Education, Socialization and Support  Summary of Progress/Problems: Self esteem: Patients discussed self esteem and how it impacts them. They discussed what aspects in their lives has influenced their self esteem. They were challenged to identify changes that are needed in order to improve self esteem.    Winnell Bento L Azarias Chiou MSW, LCSWA  09/02/2015, 3:10 PM   

## 2015-09-03 ENCOUNTER — Inpatient Hospital Stay: Payer: MEDICAID
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
## 2015-09-03 NOTE — Plan of Care (Signed)
Problem: Alteration in thought process Goal: LTG-Patient verbalizes understanding importance med regimen (Patient verbalizes understanding of importance of medication regimen and need to continue outpatient care.)  Outcome: Progressing Pt knowledgeable on current medications and compliant with regimen.

## 2015-09-03 NOTE — [facility] Group Notes (Signed)
[place] LCSW Group Therapy  09/03/2015 4:16 PM  Type of Therapy:  Group Therapy  Participation Level:  Minimal   Participation Quality:  Attentive  Affect:  Appropriate  Cognitive:  Alert  Insight:  Improving  Engagement in Therapy:  Improving  Modes of Intervention:  Discussion, Education, Socialization and Support  Summary of Progress/Problems: Balance in life: Patients will discuss the concept of balance and how it looks and feels to be unbalanced. Pt will identify areas in their life that is unbalanced and ways to become more balanced.  Pt attended group and stayed the entire time. She sat quietly and listened to other group members share.   [place] MSW, [place]  09/03/2015, 4:16 PM

## 2015-09-03 NOTE — Progress Notes (Signed)
D:  Per pt self inventory pt reports sleeping good, appetite good, energy level normal, ability to pay attention good, rates depression at a 2 out of 10, hopelessness at a 1 out of 10, anxiety at a 10 out of 10, denies SI/HI/AVH, goal today: "stop isolating, go to groups", flat during interaction.     A:  Emotional support provided, Encouraged pt to continue with treatment plan and attend all group activities, q15 min checks maintained for safety.  R:  Pt is receptive, going to groups, pleasant and cooperative with staff and other patients on the unit.

## 2015-09-03 NOTE — Progress Notes (Signed)
D: Observed pt in room resting. Patient alert and oriented x4. Patient denies SI/HI/AVH. Pt affect is appropriate. Pt mentioned that she slept a portion of the day, causing her to miss some groups. Pt rated depression 2/10 and anxiety 8/10. Pt described source of anxiety as "stressing over these things coming out" referring to foreign objects. Pt c/o of mild stomach discomfort, and was lying down in bed as a way of managing it. A: Offered active listening and support. Provided therapeutic communication. Administered scheduled medications. Encouraged pt to talk to staff of stomach discomfort worsens.  R: Pt pleasant and cooperative. Pt later indicated discomfort had abated.  Pt medication compliant. Will continue Q15 min. checks. Safety maintained.

## 2015-09-03 NOTE — [facility] Group Notes (Signed)
[place] Group Notes:  (Nursing/MHT/Case Management/Adjunct)  Date:  09/03/2015  Time:  6:26 PM  Type of Therapy:  Music Therapy  Participation Level:  Active  Participation Quality:  Attentive  Affect:  Appropriate  Cognitive:  Appropriate  Insight:  Appropriate  Engagement in Group:  Engaged  Modes of Intervention:  Activity and Socialization  Summary of Progress/Problems:  Jazelle Achey De'Chelle Edgar Reisz 09/03/2015, 6:26 PM

## 2015-09-03 NOTE — Progress Notes (Signed)
[facility] MD Progress Note  09/03/2015 12:30 PM Melanie Bell  MRN:  [id number]  Subjective: [name] complaining of abdominal pain last night but it resolved by now. She did not have a bowel movement in the past 48 hours in spite of multiple laxatives. She tells me that GoLYTELY works best for her. An abdominal x-ray is rather confusing to me. There is evidence of foreign bodies movement but I am not sure this is anterograde. They were in the colon but now they are in the small bowel again. I will talk to Dr. [name] to clarify.  Principal Problem: Schizoaffective disorder, bipolar type ([place]) Diagnosis:   Patient Active Problem List   Diagnosis Date Noted  . Foreign body alimentary tract [T18.9XXA]   . Foreign body ingestion [T18.9XXA] 07/31/2015  . Schizoaffective disorder, bipolar type ([place]) [F25.0] 11/06/2014  . Tobacco use disorder [F17.200] 09/30/2014  . Hypothyroidism [E03.9] 09/29/2014  . Hypertension [I10] 09/29/2014  . Constipation [K59.00] 09/29/2014   Total Time spent with patient: 20 minutes  Past Psychiatric History: Schizoaffective disorder.  Past Medical History:  Past Medical History  Diagnosis Date  . Hallucinations 09/30/2014    Sizoaffective  . Depression   . Anxiety   . Hypertension   . Tardive dyskinesia 10/2014    recent onset  . GERD (gastroesophageal reflux disease)   . Hyperlipidemia     Past Surgical History  Procedure Laterality Date  . Wisdom tooth extraction    . Esophagogastroduodenoscopy N/A 11/28/2014    Procedure: ESOPHAGOGASTRODUODENOSCOPY (EGD);  Surgeon: [name], MD;  Location: [facility] ENDOSCOPY;  Service: Endoscopy;  Laterality: N/A;  . Abdominal surgery      "years ago" to remove foreign objects  . Breast lumpectomy     Family History:  Family History  Problem Relation Age of Onset  . Depression Mother   . Hypertension Mother    Family Psychiatric  History: Mother with depression. Social History:  History  Alcohol Use  No     History  Drug Use No    Social History   Social History  . Marital Status: Single    Spouse Name: N/A  . Number of Children: N/A  . Years of Education: N/A   Social History Main Topics  . Smoking status: Current Every Day Smoker -- 0.50 packs/day for 3 years    Types: Cigarettes  . Smokeless tobacco: Never Used  . Alcohol Use: No  . Drug Use: No  . Sexual Activity: No   Other Topics Concern  . None   Social History Narrative   Additional Social History:    Pain Medications: See PTA Prescriptions: See PTA Over the Counter: See PTA History of alcohol / drug use?: No history of alcohol / drug abuse Longest period of sobriety (when/how long): No history of use                    Sleep: Fair  Appetite:  Fair  Current Medications: Current Facility-Administered Medications  Medication Dose Route Frequency Provider Last Rate Last Dose  . acetaminophen (TYLENOL) tablet 650 mg  650 mg Oral Q6H PRN [name], MD   650 mg at 08/30/15 1811  . alum & mag hydroxide-simeth (MAALOX/MYLANTA) 200-200-20 MG/5ML suspension 30 mL  30 mL Oral Q4H PRN [name], MD   30 mL at 08/25/15 1951  . amLODipine (NORVASC) tablet 2.5 mg  2.5 mg Oral Daily [name], MD   2.5 mg at 09/03/15 [id number]  .  benztropine (COGENTIN) tablet 1 mg  1 mg Oral QHS [name], MD   1 mg at 09/02/15 2119  . docusate sodium (COLACE) capsule 200 mg  200 mg Oral BID [name], MD   200 mg at 09/03/15 [id number]  . fluPHENAZine (PROLIXIN) tablet 5 mg  5 mg Oral 3 times per day [name], MD   5 mg at 09/03/15 0700  . lactulose (CHRONULAC) 10 GM/15ML solution 30 g  30 g Oral TID [name], MD   30 g at 09/03/15 [id number]  . levothyroxine (SYNTHROID, LEVOTHROID) tablet 75 mcg  75 mcg Oral QAC breakfast [name], MD   75 mcg at 09/03/15 0700  . lurasidone (LATUDA) tablet 120 mg  120 mg Oral Q breakfast [name], MD   120 mg at 09/03/15 [id number]  . magnesium hydroxide (MILK OF  MAGNESIA) suspension 30 mL  30 mL Oral Daily PRN [name], MD   30 mL at 08/24/15 1450  . milk and molasses enema  1 enema Rectal Once [name], MD   250 mL at 08/16/15 1230  . nicotine (NICODERM CQ - dosed in mg/24 hours) patch 21 mg  21 mg Transdermal Daily Dekayla Prestridge B Rebakah Cokley, MD   21 mg at 09/03/15 0820  . Oxcarbazepine (TRILEPTAL) tablet 150 mg  150 mg Oral BID [name], MD   150 mg at 09/03/15 [id number]  . polyethylene glycol (MIRALAX / GLYCOLAX) packet 17 g  17 g Oral BID [name], MD   17 g at 09/03/15 [id number]  . senna (SENOKOT) tablet 17.2 mg  2 tablet Oral QHS [name], MD   17.2 mg at 09/02/15 2120  . traZODone (DESYREL) tablet 100 mg  100 mg Oral QHS [name], MD   100 mg at 09/02/15 2119  . venlafaxine XR (EFFEXOR-XR) 24 hr capsule 150 mg  150 mg Oral Q breakfast [name], MD   150 mg at 09/03/15 [id number]    Lab Results: No results found for this or any previous visit (from the past 48 hour(s)).  Blood Alcohol level:  Lab Results  Component Value Date   ETH <5 07/30/2015   ETH <5 07/13/2015    Physical Findings: AIMS: Facial and Oral Movements Muscles of Facial Expression: None, normal Lips and Perioral Area: None, normal Jaw: None, normal Tongue: None, normal,Extremity Movements Upper (arms, wrists, hands, fingers): None, normal Lower (legs, knees, ankles, toes): None, normal, Trunk Movements Neck, shoulders, hips: None, normal, Overall Severity Severity of abnormal movements (highest score from questions above): None, normal Incapacitation due to abnormal movements: None, normal Patient's awareness of abnormal movements (rate only patient's report): Aware, no distress, Dental Status Current problems with teeth and/or dentures?: No Does patient usually wear dentures?: No  CIWA:    COWS:     Musculoskeletal: Strength & Muscle Tone: within normal limits Gait & Station: normal Patient leans: N/A  Psychiatric Specialty  Exam: Review of Systems  Gastrointestinal: Positive for abdominal pain.  All other systems reviewed and are negative.   Blood pressure 121/83, pulse 82, temperature 98.2 F (36.8 C), temperature source Oral, resp. rate 20, height 5\' 6"  (1.676 m), weight 86.183 kg (190 lb), SpO2 100 %.Body mass index is 30.68 kg/(m^2).  General Appearance: Casual  Eye Contact::  Good  Speech:  Clear and Coherent  Volume:  Normal  Mood:  Euthymic  Affect:  Appropriate  Thought Process:  Goal Directed  Orientation:  Full (Time, Place, and Person)  Thought Content:  WDL  Suicidal Thoughts:  No  Homicidal Thoughts:  No  Memory:  Immediate;   Fair Recent;   Fair Remote;   Fair  Judgement:  Poor  Insight:  Lacking  Psychomotor Activity:  Normal  Concentration:  Fair  Recall:  [employer] of Knowledge:Fair  Language: Fair  Akathisia:  No  Handed:  Right  AIMS (if indicated):     Assets:  Communication Skills Desire for Improvement Financial Resources/Insurance Housing Resilience Social Support  ADL's:  Intact  Cognition: WNL  Sleep:  Number of Hours: 7   Treatment Plan Summary: Daily contact with patient to assess and evaluate symptoms and progress in treatment and Medication management   [name] is a 25 year old female with a history of schizoaffective disorder admitted for worsening of depression and suicide attempt by swallowing an AA battery, an earing and a screw.   1. Suicidal ideation. This has resolved. The patient is able to contract for safety. She is forward thinking and optimistic about the future.    2. Mood and psychosis. We continued Prolixin 5mg  po TID and Latuda 120mg  po daily for psychosis, Trileptal 150mg  po BID for mood stabilization, and Effexor XR 150mg  po daily for depression.   3. Hypertension. She is on amlodipine 2.5mg  po daily.  4. Constipation. She is on bowel regimen.   5. Hypothyroidism. She is on Synthroid 61mcg po daily  6. Smoking. Nicotine patch  is available.  7. Insomnia. She responded well to trazodone.  8. Social. She is an incompetent adult. Her Kara Dies is the guardian. She lives in [place].  9. Metabolic syndrome. Lipid panel, hemoglobin A1c, TSH, and PRL were checked during previous admission and were normal. Prolactin 15.   10. Foreign body ingestion. There are three foreign objects present on X-ray. The AA battery was injected on 3/15. She will continue bowel regimen and Lactuose. Input from GI and surgery is greatly appreciated. After talking to Dr. [name], GI, we progressed her diet to soft. There will be no surgical intervention unless there is perforation/peritonitis. X-ray on 09/03/2015 shows movement of foreign objects along the gut.    Orson Slick, MD 09/03/2015, 12:30 PM

## 2015-09-03 NOTE — Progress Notes (Signed)
Recreation Therapy Notes  Date: 04.20.17 Time: 1:00 pm Location: Craft Room  Group Topic: Leisure Education  Goal Area(s) Addresses:  Patient will identify activities for each letter of the alphabet. Patient will verbalize ability to integrate positive leisure into life post d/c. Patient will verbalize ability to use leisure as a Technical sales engineer.  Behavioral Response: Did not attend  Intervention: Leisure Alphabet  Activity: Patients were given a Leisure Air traffic controller and instructed to list healthy leisure activities for each letter of the alphabet.  Education: LRT educated patients on what they need to participate in leisure activities.  Education Outcome: Patient did not attend group.  Clinical Observations/Feedback: Patient did not attend group.  Leonette Monarch, LRT/CTRS 09/03/2015 2:58 PM

## 2015-09-04 MED ORDER — MAGNESIUM CITRATE PO SOLN
1.0000 | Freq: Once | ORAL | Status: AC
  Administered 2015-09-04: 1 via ORAL
  Filled 2015-09-04: qty 296

## 2015-09-04 NOTE — Progress Notes (Signed)
Patient is pleasant and cooperative.Appropriate with staff & peers.Compliant with medications.Patient did not have bowel movement for 3 days.GI doctor came to see patient.No abdominal discomfort verbalized.

## 2015-09-04 NOTE — Consult Note (Addendum)
Subjective: Patient seen for multiple foreign body ingestion. Patient is doing well. She has had no bowel movement for over 2-3 days. Patient denies any abdominal pain or nausea. Tolerating regular diet by mouth.  Objective: Vital signs in last 24 hours: Temp:  [98 F (36.7 C)] 98 F (36.7 C) (04/21 0701) Pulse Rate:  [74] 74 (04/21 0701) Resp:  [20] 20 (04/21 0701) BP: (113)/(77) 113/77 mmHg (04/21 0701) Blood pressure 113/77, pulse 74, temperature 98 F (36.7 C), temperature source Oral, resp. rate 20, height 5\' 6"  (1.676 m), weight 86.183 kg (190 lb), SpO2 100 %.   Intake/Output from previous day: 22-Sep-2022 0701 - 04/21 0700 In: 1080 [P.O.:1080] Out: -   Intake/Output this shift: Total I/O In: 360 [P.O.:360] Out: -    General appearance:  Well-appearing 25 year old female no distress Resp: Bilateral clear to auscultation Cardio:  Regular rate and rhythm GI:  Protuberant soft nontender nondistended bowel sounds positive normoactive Extremities:  No clubbing cyanosis or edema   Lab Results: No results found for this or any previous visit (from the past 24 hour(s)).   No results for input(s): WBC, HGB, HCT, PLT in the last 72 hours. BMET No results for input(s): NA, K, CL, CO2, GLUCOSE, BUN, CREATININE, CALCIUM in the last 72 hours. LFT No results for input(s): PROT, ALBUMIN, AST, ALT, ALKPHOS, BILITOT, BILIDIR, IBILI in the last 72 hours. PT/INR No results for input(s): LABPROT, INR in the last 72 hours. Hepatitis Panel No results for input(s): HEPBSAG, HCVAB, HEPAIGM, HEPBIGM in the last 72 hours. C-Diff No results for input(s): CDIFFTOX in the last 72 hours. No results for input(s): CDIFFPCR in the last 72 hours.   Studies/Results: Dg Abd 2 Views  09-22-2015  CLINICAL DATA:  Followup for foreign bodies. EXAM: ABDOMEN - 2 VIEW COMPARISON:  08/31/2015 FINDINGS: Foreign bodies have moved since the prior exam. The apparent shot gun shell and screw project in the left mid  abdomen. The arrow head projects in the right mid abdomen. The arrow head may reside at the ileocecal junction. The left-sided foreign bodies may be in small bowel or the left colon. Based on the lateral view, left colon location the left-sided foreign bodies distal is most likely. There is no convincing free air on the lateral view. No evidence of bowel obstruction. IMPRESSION: Persistent foreign bodies which have moved in location when compared the prior study as detailed above. No convincing bowel obstruction or free air. Electronically Signed   By: Lajean Manes M.D.   On: 09/22/2015 10:25    Scheduled Inpatient Medications:   . amLODipine  2.5 mg Oral Daily  . benztropine  1 mg Oral QHS  . docusate sodium  200 mg Oral BID  . fluPHENAZine  5 mg Oral 3 times per day  . lactulose  30 g Oral TID  . levothyroxine  75 mcg Oral QAC breakfast  . lurasidone  120 mg Oral Q breakfast  . milk and molasses  1 enema Rectal Once  . nicotine  21 mg Transdermal Daily  . Oxcarbazepine  150 mg Oral BID  . polyethylene glycol  17 g Oral BID  . senna  2 tablet Oral QHS  . traZODone  100 mg Oral QHS  . venlafaxine XR  150 mg Oral Q breakfast    Continuous Inpatient Infusions:     PRN Inpatient Medications:  acetaminophen, alum & mag hydroxide-simeth, magnesium hydroxide  Miscellaneous:   Assessment:  1. Multiple foreign body ingestion. Patient is been stable.  There is been slow progress of these items through the bowel. Uncertain of location in regards to radiologic evaluation. Patient is essentially been asymptomatic. Discussed again with surgery. She is not yet a candidate. Patient has had 2 abdominal surgeries in the past for foreign body ingestion, possibility of adhesions impairing progress.  2. Outpatient history of constipation.  Plan:  1. Continue current, MiraLAX twice a day. May add a dose of mag citrate if needed. 2. Dr. Drema Dallas covering for GI over the weekend if needed.  [name] MD 09/04/2015, 2:05 PM

## 2015-09-04 NOTE — [facility] Group Notes (Signed)
[facility] LCSW Group Therapy  09/04/2015 2:21 PM  Type of Therapy:  Group Therapy  Participation Level:  Did Not Attend  Modes of Intervention:  Discussion, Education, Socialization and Support  Summary of Progress/Problems: Feelings around Relapse. Group members discussed the meaning of relapse and shared personal stories of relapse, how it affected them and others, and how they perceived themselves during this time. Group members were encouraged to identify triggers, warning signs and coping skills used when facing the possibility of relapse. Social supports were discussed and explored in detail.   Melanie Bell L Melanie Bell  MSW, LCSWA   09/04/2015, 2:21 PM   

## 2015-09-04 NOTE — Progress Notes (Signed)
D: Observed pt interacting in dayroom. Patient alert and oriented x4. Patient denies SI/HI/AVH. Pt affect is appropriate. Pt indicated she has not had a bowel movement in 3 days, and there was no bowel movement today. Pt rated depression 2/10 and anxiety 8/10. Pt stated "I'm ready to get it out of me" referring to foreign objects, and indicated that is still the source of her stress.  A: Offered active listening and support. Provided therapeutic communication. Administered scheduled medications.  R: Pt pleasant and cooperative. Pt medication compliant. Will continue Q15 min. checks. Safety maintained

## 2015-09-04 NOTE — [facility] Group Notes (Signed)
[place] Group Notes:  (Nursing/MHT/Case Management/Adjunct)  Date:  09/04/2015  Time:  5:08 PM  Type of Therapy:  Psychoeducational Skills  Participation Level:  Active  Participation Quality:  Appropriate, Attentive and Sharing  Affect:  Appropriate  Cognitive:  Alert and Appropriate  Insight:  Appropriate  Engagement in Group:  Engaged  Modes of Intervention:  Discussion, Education and Support  Summary of Progress/Problems:  Adela Lank [facility] 09/04/2015, 5:08 PM

## 2015-09-04 NOTE — Progress Notes (Signed)
Recreation Therapy Notes  Date: 04.21.17 Time: 1:00 pm Location: Craft Room  Group Topic: Self-expression, Coping skills  Goal Area(s) Addresses:  Patient will effectively use art as a means of self-expression. Patient will recognize positive benefit of self-expression. Patient will be able to identify one emotion experienced during group. Patient will identify use of art as a coping skill.  Behavioral Response: Did not attend  Intervention: Two Faces of Me  Activity: Patients were given a blank face worksheet and instructed to draw a line down the middle. On one side of the face, patients were instructed to draw or write how they felt when they were admitted to the hospital. On the other side of the face, patients were instructed to draw or write how they want to feel when they are d/c.  Education: LRT educated patients on different forms of self-expression.  Education Outcome: Patient did not attend group.   Clinical Observations/Feedback: Patient did not attend group.  Leonette Monarch, LRT/CTRS 09/04/2015 2:05 PM

## 2015-09-04 NOTE — Progress Notes (Addendum)
[facility] MD Progress Note  09/04/2015 9:44 AM Melanie Bell  MRN:  [name]  Subjective:  Melanie Bell is a 25 year old female with a history of schizoaffective disorder admitted to our service after ingestion of 3 foreign objects including a battery on March 15. She is psychiatrically stable. She is being followed by Dr. [name], GI. Sequential abdominal x-rays show improvement on the following objects along the gut.  Melanie Bell reports no somatic symptoms. There is no abdominal pain or discomfort. She has had no bowel movement in the past 72 hours in spite of multiple laxatives and soft diet. She denies any symptoms of depression, anxiety, or psychosis. She is not suicidal or homicidal. She accepts medications and tolerates them well. Good group participation.   Principal Problem: Schizoaffective disorder, bipolar type ([place]) Diagnosis:   Patient Active Problem List   Diagnosis Date Noted  . Foreign body alimentary tract [T18.9XXA]   . Foreign body ingestion [T18.9XXA] 07/31/2015  . Schizoaffective disorder, bipolar type ([place]) [F25.0] 11/06/2014  . Tobacco use disorder [F17.200] 09/30/2014  . Hypothyroidism [E03.9] 09/29/2014  . Hypertension [I10] 09/29/2014  . Constipation [K59.00] 09/29/2014   Total Time spent with patient: 20 minutes  Past Psychiatric History: Schizoaffective disorder.  Past Medical History:  Past Medical History  Diagnosis Date  . Hallucinations 09/30/2014    Sizoaffective  . Depression   . Anxiety   . Hypertension   . Tardive dyskinesia 10/2014    recent onset  . GERD (gastroesophageal reflux disease)   . Hyperlipidemia     Past Surgical History  Procedure Laterality Date  . Wisdom tooth extraction    . Esophagogastroduodenoscopy N/A 11/28/2014    Procedure: ESOPHAGOGASTRODUODENOSCOPY (EGD);  Surgeon: [name], MD;  Location: [facility] ENDOSCOPY;  Service: Endoscopy;  Laterality: N/A;  . Abdominal surgery      "years ago" to remove foreign  objects  . Breast lumpectomy     Family History:  Family History  Problem Relation Age of Onset  . Depression Mother   . Hypertension Mother    Family Psychiatric  History: Mother with depression. Social History:  History  Alcohol Use No     History  Drug Use No    Social History   Social History  . Marital Status: Single    Spouse Name: N/A  . Number of Children: N/A  . Years of Education: N/A   Social History Main Topics  . Smoking status: Current Every Day Smoker -- 0.50 packs/day for 3 years    Types: Cigarettes  . Smokeless tobacco: Never Used  . Alcohol Use: No  . Drug Use: No  . Sexual Activity: No   Other Topics Concern  . None   Social History Narrative   Additional Social History:    Pain Medications: See PTA Prescriptions: See PTA Over the Counter: See PTA History of alcohol / drug use?: No history of alcohol / drug abuse Longest period of sobriety (when/how long): No history of use                    Sleep: Fair  Appetite:  Fair  Current Medications: Current Facility-Administered Medications  Medication Dose Route Frequency Provider Last Rate Last Dose  . acetaminophen (TYLENOL) tablet 650 mg  650 mg Oral Q6H PRN [name], MD   650 mg at 08/30/15 1811  . alum & mag hydroxide-simeth (MAALOX/MYLANTA) 200-200-20 MG/5ML suspension 30 mL  30 mL Oral Q4H PRN [name], MD  30 mL at 08/25/15 1951  . amLODipine (NORVASC) tablet 2.5 mg  2.5 mg Oral Daily [name], MD   2.5 mg at 09/04/15 0858  . benztropine (COGENTIN) tablet 1 mg  1 mg Oral QHS [name], MD   1 mg at 09/03/15 2119  . docusate sodium (COLACE) capsule 200 mg  200 mg Oral BID [name], MD   200 mg at 09/04/15 0857  . fluPHENAZine (PROLIXIN) tablet 5 mg  5 mg Oral 3 times per day [name], MD   5 mg at 09/04/15 [id number]  . lactulose (CHRONULAC) 10 GM/15ML solution 30 g  30 g Oral TID [name], MD   30 g at 09/04/15 0855  . levothyroxine  (SYNTHROID, LEVOTHROID) tablet 75 mcg  75 mcg Oral QAC breakfast [name], MD   75 mcg at 09/04/15 0631  . lurasidone (LATUDA) tablet 120 mg  120 mg Oral Q breakfast [name], MD   120 mg at 09/04/15 0857  . magnesium hydroxide (MILK OF MAGNESIA) suspension 30 mL  30 mL Oral Daily PRN [name], MD   30 mL at 08/24/15 1450  . milk and molasses enema  1 enema Rectal Once [name], MD   250 mL at 08/16/15 1230  . nicotine (NICODERM CQ - dosed in mg/24 hours) patch 21 mg  21 mg Transdermal Daily [name] [name], MD   21 mg at 09/04/15 0856  . Oxcarbazepine (TRILEPTAL) tablet 150 mg  150 mg Oral BID [name], MD   150 mg at 09/04/15 0859  . polyethylene glycol (MIRALAX / GLYCOLAX) packet 17 g  17 g Oral BID [name], MD   17 g at 09/04/15 0900  . senna (SENOKOT) tablet 17.2 mg  2 tablet Oral QHS [name], MD   17.2 mg at 09/03/15 2119  . traZODone (DESYREL) tablet 100 mg  100 mg Oral QHS [name], MD   100 mg at 09/03/15 2119  . venlafaxine XR (EFFEXOR-XR) 24 hr capsule 150 mg  150 mg Oral Q breakfast [name], MD   150 mg at 09/04/15 [id number]    Lab Results: No results found for this or any previous visit (from the past 48 hour(s)).  Blood Alcohol level:  Lab Results  Component Value Date   ETH <5 07/30/2015   ETH <5 07/13/2015    Physical Findings: AIMS: Facial and Oral Movements Muscles of Facial Expression: None, normal Lips and Perioral Area: None, normal Jaw: None, normal Tongue: None, normal,Extremity Movements Upper (arms, wrists, hands, fingers): None, normal Lower (legs, knees, ankles, toes): None, normal, Trunk Movements Neck, shoulders, hips: None, normal, Overall Severity Severity of abnormal movements (highest score from questions above): None, normal Incapacitation due to abnormal movements: None, normal Patient's awareness of abnormal movements (rate only patient's report): Aware, no distress, Dental  Status Current problems with teeth and/or dentures?: No Does patient usually wear dentures?: No  CIWA:    COWS:     Musculoskeletal: Strength & Muscle Tone: within normal limits Gait & Station: normal Patient leans: N/A  Psychiatric Specialty Exam: Review of Systems  All other systems reviewed and are negative.   Blood pressure 113/77, pulse 74, temperature 98 F (36.7 C), temperature source Oral, resp. rate 20, height 5\' 6"  (1.676 m), weight 86.183 kg (190 lb), SpO2 100 %.Body mass index is 30.68 kg/(m^2).  General Appearance: Casual  Eye Contact::  Good  Speech:  Clear and Coherent  Volume:  Normal  Mood:  Anxious  Affect:  Appropriate  Thought Process:  Goal Directed  Orientation:  Full (Time, Place, and Person)  Thought Content:  WDL  Suicidal Thoughts:  No  Homicidal Thoughts:  No  Memory:  Immediate;   Fair Recent;   Fair Remote;   Fair  Judgement:  Poor  Insight:  Lacking  Psychomotor Activity:  Normal  Concentration:  Fair  Recall:  [employer] of Knowledge:Fair  Language: Fair  Akathisia:  No  Handed:  Right  AIMS (if indicated):     Assets:  Communication Skills Desire for Improvement Financial Resources/Insurance Housing Resilience Social Support  ADL's:  Intact  Cognition: WNL  Sleep:  Number of Hours: 7.25   Treatment Plan Summary: Daily contact with patient to assess and evaluate symptoms and progress in treatment and Medication management   Melanie Bell is a 25 year old female with a history of schizoaffective disorder admitted for worsening of depression and suicide attempt by swallowing an AA battery, an earing and a screw.   1. Suicidal ideation. This has resolved. The patient is able to contract for safety. She is forward thinking and optimistic about the future.    2. Mood and psychosis. We continued Prolixin 5mg  po TID and Latuda 120mg  po daily for psychosis, Trileptal 150mg  po BID for mood stabilization, and Effexor XR 150mg  po daily for  depression.   3. Hypertension. She is on amlodipine 2.5mg  po daily.  4. Constipation. She is on bowel regimen.   5. Hypothyroidism. She is on Synthroid 12mcg po daily  6. Smoking. Nicotine patch is available.  7. Insomnia. She responded well to trazodone.  8. Social. She is an incompetent adult. Her Kara Dies is the guardian. She lives in [place].  9. Metabolic syndrome. Lipid panel, hemoglobin A1c, TSH, and PRL were checked during previous admission and were normal. Prolactin 15.   10. Foreign body ingestion. There are three foreign objects present on X-ray. The AA battery was injected on 3/15. She will continue bowel regimen and Lactuose. Input from GI and surgery is greatly appreciated. After talking to Dr. [name], GI, we progressed her diet to soft. There will be no surgical intervention unless there is perforation/peritonitis. X-ray on 09/03/2015 shows movement of foreign objects along the gut.   I certify that the services received since the previous certification/recertification were and continue to be medically necessary as the treatment provided can be reasonably expected to improve the patient's condition; the medical record documents that the services furnished were intensive treatment services or their equivalent services, and this patient continues to need, on a daily basis, active treatment furnished directly by or requiring the supervision of inpatient psychiatric personnel.   Orson Slick, MD 09/04/2015, 9:44 AM

## 2015-09-04 NOTE — [facility] Group Notes (Signed)
[place] Group Notes:  (Nursing/MHT/Case Management/Adjunct)  Date:  09/04/2015  Time:  5:36 AM  Type of Therapy:  Group Therapy  Participation Level:  Active  Participation Quality:  Appropriate  Affect:  Appropriate  Cognitive:  Appropriate  Insight:  Appropriate  Engagement in Group:  Engaged  Modes of Intervention:  Discussion  Summary of Progress/Problems:  Melanie Bell 09/04/2015, 5:36 AM

## 2015-09-04 NOTE — Plan of Care (Signed)
Problem: Alteration in thought process Goal: STG-Patient is able to sleep at least 6 hours per night Outcome: Progressing Pt regularly sleeping over 6 hrs/night

## 2015-09-05 MED ORDER — AMANTADINE HCL 100 MG PO CAPS
100.0000 mg | ORAL_CAPSULE | Freq: Two times a day (BID) | ORAL | Status: DC
  Administered 2015-09-05 – 2015-09-10 (×11): 100 mg via ORAL
  Filled 2015-09-05 (×11): qty 1

## 2015-09-05 NOTE — Plan of Care (Signed)
Problem: Alteration in thought process Goal: LTG-Patient verbalizes understanding importance med regimen (Patient verbalizes understanding of importance of medication regimen and need to continue outpatient care.)  Outcome: Progressing Pt compliant with medication regimen and understands importance.

## 2015-09-05 NOTE — Plan of Care (Signed)
Problem: Ineffective individual coping Goal: STG: Patient will remain free from self harm Outcome: Progressing Pt safe on the unit at this time     

## 2015-09-05 NOTE — Progress Notes (Signed)
D: Observed pt interacting in dayroom. Patient alert and oriented x4. Patient denies SI/HI/AVH. Pt affect is appropriate. Pt indicated she had one small bowel movement earlier in the day, but there was no bowel movement tonight. Pt rated depression 1/10 and anxiety 8/10. Pt stated stressed over foreign objects. A: Offered active listening and support. Provided therapeutic communication. Administered scheduled medications.  R: Pt pleasant and cooperative. Pt medication compliant. Will continue Q15 min. checks. Safety maintained

## 2015-09-05 NOTE — Progress Notes (Addendum)
D: Pt denies SI/HI/AVH. Pt is pleasant and cooperative. Pt interacting on unit with peers. Pt appears to have brighter affect , smiling at times and joking with Probation officer.   A: Pt was offered support and encouragement. Pt was given scheduled medications. Pt was encourage to attend groups. Q 15 minute checks were done for safety.   R:Pt attends groups and interacts well with peers and staff. Pt is taking medication. Pt has no complaints at this time.Pt receptive to treatment and safety maintained on unit.

## 2015-09-05 NOTE — Progress Notes (Signed)
[facility] MD Progress Note  09/05/2015 11:40 AM LILLYANN LONGHI  MRN:  [name]  Subjective:  [name] is a 25 year old female with a history of schizoaffective disorder admitted to our service after ingestion of 3 foreign objects including a battery on March 15. She is psychiatrically stable. She is being followed by Dr. [name], GI. Sequential abdominal x-rays show improvement on the following objects along the gut.  [name] reports no somatic symptoms. There is no abdominal pain or discomfort. She has had no bowel movement since yesterday evening after taking magnesium citrate. . She denies any symptoms of depression, anxiety, or psychosis. She is not suicidal or homicidal. She accepts medications and tolerates them well. Good group participation.  denies any physical complaints. She denies having any side effects from medications.. The patient has been pleasant and cooperative. She is not displaying any disruptive behaviors in the unit.   Per nursing:  Observed pt interacting in dayroom. Patient alert and oriented x4. Patient denies SI/HI/AVH. Pt affect is appropriate. Pt indicated she had one small bowel movement earlier in the day, but there was no bowel movement tonight. Pt rated depression 1/10 and anxiety 8/10. Pt stated stressed over foreign objects.  Principal Problem: Schizoaffective disorder, bipolar type ([place]) Diagnosis:   Patient Active Problem List   Diagnosis Date Noted  . Foreign body alimentary tract [T18.9XXA]   . Foreign body ingestion [T18.9XXA] 07/31/2015  . Schizoaffective disorder, bipolar type ([place]) [F25.0] 11/06/2014  . Tobacco use disorder [F17.200] 09/30/2014  . Hypothyroidism [E03.9] 09/29/2014  . Hypertension [I10] 09/29/2014  . Constipation [K59.00] 09/29/2014   Total Time spent with patient: 20 minutes  Past Psychiatric History: Schizoaffective disorder.  Past Medical History:  Past Medical History  Diagnosis Date  . Hallucinations 09/30/2014   Sizoaffective  . Depression   . Anxiety   . Hypertension   . Tardive dyskinesia 10/2014    recent onset  . GERD (gastroesophageal reflux disease)   . Hyperlipidemia     Past Surgical History  Procedure Laterality Date  . Wisdom tooth extraction    . Esophagogastroduodenoscopy N/A 11/28/2014    Procedure: ESOPHAGOGASTRODUODENOSCOPY (EGD);  Surgeon: [name], MD;  Location: [facility] ENDOSCOPY;  Service: Endoscopy;  Laterality: N/A;  . Abdominal surgery      "years ago" to remove foreign objects  . Breast lumpectomy     Family History:  Family History  Problem Relation Age of Onset  . Depression Mother   . Hypertension Mother    Family Psychiatric  History: Mother with depression. Social History:  History  Alcohol Use No     History  Drug Use No    Social History   Social History  . Marital Status: Single    Spouse Name: N/A  . Number of Children: N/A  . Years of Education: N/A   Social History Main Topics  . Smoking status: Current Every Day Smoker -- 0.50 packs/day for 3 years    Types: Cigarettes  . Smokeless tobacco: Never Used  . Alcohol Use: No  . Drug Use: No  . Sexual Activity: No   Other Topics Concern  . None   Social History Narrative   Additional Social History:    Pain Medications: See PTA Prescriptions: See PTA Over the Counter: See PTA History of alcohol / drug use?: No history of alcohol / drug abuse Longest period of sobriety (when/how long): No history of use  Sleep: Good  Appetite:  Good  Current Medications: Current Facility-Administered Medications  Medication Dose Route Frequency Provider Last Rate Last Dose  . acetaminophen (TYLENOL) tablet 650 mg  650 mg Oral Q6H PRN [name], MD   650 mg at 09/04/15 1439  . alum & mag hydroxide-simeth (MAALOX/MYLANTA) 200-200-20 MG/5ML suspension 30 mL  30 mL Oral Q4H PRN [name], MD   30 mL at 08/25/15 1951  . amantadine (SYMMETREL) capsule 100 mg   100 mg Oral BID [name], MD      . amLODipine (NORVASC) tablet 2.5 mg  2.5 mg Oral Daily [name], MD   2.5 mg at 09/05/15 0834  . docusate sodium (COLACE) capsule 200 mg  200 mg Oral BID [name], MD   200 mg at 09/05/15 0836  . fluPHENAZine (PROLIXIN) tablet 5 mg  5 mg Oral 3 times per day [name], MD   5 mg at 09/05/15 0646  . lactulose (CHRONULAC) 10 GM/15ML solution 30 g  30 g Oral TID [name], MD   30 g at 09/05/15 [id number]  . levothyroxine (SYNTHROID, LEVOTHROID) tablet 75 mcg  75 mcg Oral QAC breakfast [name], MD   75 mcg at 09/05/15 0646  . lurasidone (LATUDA) tablet 120 mg  120 mg Oral Q breakfast [name], MD   120 mg at 09/05/15 0835  . magnesium hydroxide (MILK OF MAGNESIA) suspension 30 mL  30 mL Oral Daily PRN [name], MD   30 mL at 08/24/15 1450  . nicotine (NICODERM CQ - dosed in mg/24 hours) patch 21 mg  21 mg Transdermal Daily [name], MD   21 mg at 09/05/15 0837  . Oxcarbazepine (TRILEPTAL) tablet 150 mg  150 mg Oral BID [name], MD   150 mg at 09/05/15 0836  . polyethylene glycol (MIRALAX / GLYCOLAX) packet 17 g  17 g Oral BID [name], MD   17 g at 09/05/15 0831  . senna (SENOKOT) tablet 17.2 mg  2 tablet Oral QHS [name], MD   17.2 mg at 09/04/15 2121  . traZODone (DESYREL) tablet 100 mg  100 mg Oral QHS [name], MD   100 mg at 09/04/15 2121  . venlafaxine XR (EFFEXOR-XR) 24 hr capsule 150 mg  150 mg Oral Q breakfast [name], MD   150 mg at 09/05/15 [id number]    Lab Results: No results found for this or any previous visit (from the past 48 hour(s)).  Blood Alcohol level:  Lab Results  Component Value Date   ETH <5 07/30/2015   ETH <5 07/13/2015    Physical Findings: AIMS: Facial and Oral Movements Muscles of Facial Expression: None, normal Lips and Perioral Area: None, normal Jaw: None, normal Tongue: None, normal,Extremity Movements Upper (arms, wrists,  hands, fingers): None, normal Lower (legs, knees, ankles, toes): None, normal, Trunk Movements Neck, shoulders, hips: None, normal, Overall Severity Severity of abnormal movements (highest score from questions above): None, normal Incapacitation due to abnormal movements: None, normal Patient's awareness of abnormal movements (rate only patient's report): Aware, no distress, Dental Status Current problems with teeth and/or dentures?: No Does patient usually wear dentures?: No  CIWA:    COWS:     Musculoskeletal: Strength & Muscle Tone: within normal limits Gait & Station: normal Patient leans: N/A  Psychiatric Specialty Exam: Review of Systems  Constitutional: Negative.   HENT: Negative.   Eyes:  Negative.   Respiratory: Negative.   Cardiovascular: Negative.   Gastrointestinal: Negative.   Genitourinary: Negative.   Musculoskeletal: Negative.   Skin: Negative.   Neurological: Negative.   Endo/Heme/Allergies: Negative.   Psychiatric/Behavioral: Negative.   All other systems reviewed and are negative.   Blood pressure 118/83, pulse 73, temperature 98.6 F (37 C), temperature source Oral, resp. rate 20, height 5\' 6"  (1.676 m), weight 86.183 kg (190 lb), SpO2 100 %.Body mass index is 30.68 kg/(m^2).  General Appearance: Casual  Eye Contact::  Good  Speech:  Clear and Coherent  Volume:  Normal  Mood:  Euthymic  Affect:  Constricted  Thought Process:  Goal Directed  Orientation:  Full (Time, Place, and Person)  Thought Content:  WDL  Suicidal Thoughts:  No  Homicidal Thoughts:  No  Memory:  Immediate;   Fair Recent;   Fair Remote;   Fair  Judgement:  Poor  Insight:  Lacking  Psychomotor Activity:  Normal  Concentration:  Fair  Recall:  [employer] of Knowledge:Fair  Language: Fair  Akathisia:  No  Handed:  Right  AIMS (if indicated):     Assets:  Communication Skills Desire for Improvement Financial Resources/Insurance Housing Resilience Social Support  ADL's:   Intact  Cognition: WNL  Sleep:  Number of Hours: 7.5   Treatment Plan Summary: Daily contact with patient to assess and evaluate symptoms and progress in treatment and Medication management   [name] is a 25 year old female with a history of schizoaffective disorder admitted for worsening of depression and suicide attempt by swallowing an AA battery, an earing and a screw.   1. Suicidal ideation. This has resolved. The patient is able to contract for safety. She is forward thinking and optimistic about the future.    2. Mood and psychosis. We continued Prolixin 5mg  po TID and Latuda 120mg  po daily for psychosis, Trileptal 150mg  po BID for mood stabilization, and Effexor XR 150mg  po daily for depression.  Patient is a stable no changes will be made to her medication regimen   3. Hypertension. She is on amlodipine 2.5mg  po daily.  4. Constipation. She is on bowel regimen.  patient had a bowel movement all April 21. She does not think she has passed foreign objects. She has not had any urgency to move her bowels today despite receiving a multitude of laxatives   5. Hypothyroidism. She is on Synthroid 35mcg po daily  6. Smoking. Nicotine patch is available.  7. Insomnia. She responded well to trazodone.  8. Social. She is an incompetent adult. Her Kara Dies is the guardian. She lives in [place].  9. Metabolic syndrome. Lipid panel, hemoglobin A1c, TSH, and PRL were checked during previous admission and were normal. Prolactin 15.   10. Foreign body ingestion. There are three foreign objects present on X-ray. The AA battery was injected on 3/15. She will continue bowel regimen and Lactuose. Input from GI and surgery is greatly appreciated. After talking to Dr. [name], GI, we progressed her diet to soft. There will be no surgical intervention unless there is perforation/peritonitis. X-ray on 09/03/2015 shows movement of foreign objects along the gut.    I have seen and evaluated this  patient today. There there will be no changes made to her current management. Patient appears to be stable psychiatrically. At this point were waiting for her to pass foreign objects safely.  [name], MD 09/05/2015, 11:40 AM

## 2015-09-05 NOTE — Progress Notes (Signed)
Pt continues to refuse to get up for breakfast stating"I am not a morning person". Pt did get up about 1030hrs and had snacks. Pt's mood and affect appears depressed but brightens with her interactions with others. Pt denies SI and A/V hallucinations. Pt has been compliant with taking po meds.

## 2015-09-06 NOTE — [facility] Group Notes (Addendum)
[place] [facility] LCSW Group Therapy  09/06/2015 7:52 AM  Type of Therapy:  Group Therapy  Participation Level:  Did Not Attend  Participation Quality:    Affect:    Cognitive:    Insight:    Engagement in Therapy:    Modes of Intervention:    Summary of Progress/Problems:  Joana Reamer 09/06/2015, 7:52 AM

## 2015-09-06 NOTE — Progress Notes (Signed)
Pt continues to refuse to get up for breakfast stating"I am not a morning person". Pt did get up about 1030hrs and had snacks. Pt's mood and affect appears depressed but brightens with her interactions with others. Pt denies SI and A/V hallucinations. Pt has been compliant with taking po meds. Pt voices no c/o discomfort at present.Will continue to observe and maintain a safe environment.

## 2015-09-06 NOTE — Tx Team (Signed)
Interdisciplinary Treatment Plan Update (Adult)  Date:  09/06/2015 Time Reviewed:  10:25 PM  Progress in Treatment: Attending groups: Yes. Participating in groups:  Yes. Taking medication as prescribed:  Yes. Tolerating medication:  Yes. Family/Significant othe contact made:  Yes, individual(s) contacted:  mother and group home Patient understands diagnosis:  Yes. Discussing patient identified problems/goals with staff:  Yes. Medical problems stabilized or resolved:  No. and As evidenced by:  Pt has not passed the battery yet.  Denies suicidal/homicidal ideation: Yes. Issues/concerns per patient self-inventory:  Yes. Other:  New problem(s) identified: No, Describe:  NA  Discharge Plan or Barriers: Pt plans to return to group home and follow up with outpatient.    Reason for Continuation of Hospitalization: Hallucinations Medication stabilization Other; describe Pt swallowed objects  Comments: Melanie Bell is a 25 year old female with a history of schizoaffective disorder. She was briefly admitted to medical floor after she swallowed 3 foreign objects including a screw, AA battery and an earring. She was transferred to psychiatry before she passed them. The battery has been there since March 15. Apparently about that he should not stick around the gap for longer than a week according to GI note. She is now being followed by Dr. [name], GI. Surgery refuses to operate until there is perforation and/or peritonitis. The patient is no longer in need of psychiatric care but I could not discharge her to home given her medical condition. She is on liquid diet and laxatives.  Melanie Bell denies any symptoms of depression, anxiety, or psychosis. She denies any abdominal pain. In spite over multiple laxatives and lactulose she has not had a bowel movement yesterday. We will give mag citrate again today as her x-ray indicates massive amounts of stool in the bowels. She accepts medications and  tolerates them well. She participates well in programming.   Estimated length of stay: up to 7 days  New goal(s): NA  Review of initial/current patient goals per problem list:   1.  Goal(s): Patient will participate in aftercare plan * Met: Yes * Target date: at discharge * As evidenced by: Patient will participate within aftercare plan AEB aftercare provider and housing plan at discharge being identified. 08/18/15: Patient has a provider and will be referred to [facility] and has a group home which she can return to at discharge. Goal met.    2.  Goal (s): Patient will exhibit decreased depressive symptoms and suicidal ideations. * Met: Yes *  Target date: at discharge * As evidenced by: Patient will utilize self rating of depression at 3 or below and demonstrate decreased signs of depression or be deemed stable for discharge by MD. 08/18/15: Patient reports no SI and depression of 0. Goal met.   3.  Goal (s): Patient will demonstrate decreased symptoms of psychosis. * Met: No  *  Target date: at discharge * As evidenced by: Patient will not endorse signs of psychosis or be deemed stable for discharge by MD.  08/18/15: Patient reports no psychosis but is awaiting a batter to pass through her intestines before discharge.  08/20/15: Patient is still awaiting battery which she swallowed to pass and will discharge once this happens. MD is stating this could happen in the next couple of days.  08/25/15: Patient is continued to be monitored for movement of battery she swallowed and cannot be discharged at this time as this is a risk and remains in BMU as medical floor will not accept patient.  08/27/15: Patient is still waiting  for batter to discharge but no luck yet.  09/01/15: Patient has battery in her intestines that she swallowed and will discharge once she passes this per MD. 09/03/15: Patient is awaiting for a battery which she swallowed to discharge.   Attendees: Physician:   Orson Slick, MD  4/23/201710:25 PM  Nursing:   Elige Radon, RN  4/23/201710:25 PM  Other:  Keene Breath,  4/23/201710:25 PM  Other:  Garald Braver, Ph D  4/23/201710:25 PM  Other:  Bonnye Fava, [place]  4/23/201710:25 PM  Other: Everitt Amber, [place] 4/23/201710:25 PM  Other:  4/23/201710:25 PM  Other:  4/23/201710:25 PM  Other:  4/23/201710:25 PM  Other:  4/23/201710:25 PM  Other:   4/23/201710:25 PM   Scribe for Treatment Team:   Keene Breath, MSW, LCSW  09/06/2015, 10:25 PM

## 2015-09-06 NOTE — [facility] Group Notes (Addendum)
[place] LCSW Group Therapy  09/06/2015 5:20 PM  Type of Therapy:  Group Therapy  Participation Level:  Did Not Attend  Participation Quality:    Affect:    Cognitive:    Insight:    Engagement in Therapy:    Modes of Intervention:    Summary of Progress/Problems:  Joana Reamer 09/06/2015, 5:20 PM

## 2015-09-06 NOTE — Plan of Care (Signed)
Problem: Ineffective individual coping Goal: STG: Patient will remain free from self harm Outcome: Progressing Pt remains free from self harm, verbalized being remorseful for her dangerous bx with swallowing things.

## 2015-09-06 NOTE — Progress Notes (Signed)
[facility] MD Progress Note  09/06/2015 10:16 AM Melanie Bell  MRN:  [name]  Subjective:  [name] is a 25 year old female with a history of schizoaffective disorder admitted to our service after ingestion of 3 foreign objects including a battery on March 15. She is psychiatrically stable. She is being followed by Dr. [name], GI. Sequential abdominal x-rays show improvement on the following objects along the gut.  Patient denies having any issues or concerns today. She has had one bowel movement a day since Friday. She denies any symptoms consistent with obstruction. She has been attending groups. She is compliant with medications. She is showing appropriate interactions with staff and peers. She denies any side effects from medications. She denies any physical complaints. She denies any symptoms of psychosis. She denies depressed mood, major problems with sleep, appetite, energy or concentration.  Per nursing: Pt denies SI/HI/AVH. Pt is pleasant and cooperative. Pt interacting on unit with peers. Pt appears to have brighter affect , smiling at times and joking with Probation officer.   Principal Problem: Schizoaffective disorder, bipolar type ([place]) Diagnosis:   Patient Active Problem List   Diagnosis Date Noted  . Foreign body alimentary tract [T18.9XXA]   . Foreign body ingestion [T18.9XXA] 07/31/2015  . Schizoaffective disorder, bipolar type ([place]) [F25.0] 11/06/2014  . Tobacco use disorder [F17.200] 09/30/2014  . Hypothyroidism [E03.9] 09/29/2014  . Hypertension [I10] 09/29/2014  . Constipation [K59.00] 09/29/2014   Total Time spent with patient: 20 minutes  Past Psychiatric History: Schizoaffective disorder.  Past Medical History:  Past Medical History  Diagnosis Date  . Hallucinations 09/30/2014    Sizoaffective  . Depression   . Anxiety   . Hypertension   . Tardive dyskinesia 10/2014    recent onset  . GERD (gastroesophageal reflux disease)   . Hyperlipidemia     Past  Surgical History  Procedure Laterality Date  . Wisdom tooth extraction    . Esophagogastroduodenoscopy N/A 11/28/2014    Procedure: ESOPHAGOGASTRODUODENOSCOPY (EGD);  Surgeon: [name], MD;  Location: [facility] ENDOSCOPY;  Service: Endoscopy;  Laterality: N/A;  . Abdominal surgery      "years ago" to remove foreign objects  . Breast lumpectomy     Family History:  Family History  Problem Relation Age of Onset  . Depression Mother   . Hypertension Mother    Family Psychiatric  History: Mother with depression. Social History:  History  Alcohol Use No     History  Drug Use No    Social History   Social History  . Marital Status: Single    Spouse Name: N/A  . Number of Children: N/A  . Years of Education: N/A   Social History Main Topics  . Smoking status: Current Every Day Smoker -- 0.50 packs/day for 3 years    Types: Cigarettes  . Smokeless tobacco: Never Used  . Alcohol Use: No  . Drug Use: No  . Sexual Activity: No   Other Topics Concern  . None   Social History Narrative   Additional Social History:    Pain Medications: See PTA Prescriptions: See PTA Over the Counter: See PTA History of alcohol / drug use?: No history of alcohol / drug abuse Longest period of sobriety (when/how long): No history of use                    Sleep: Good  Appetite:  Good  Current Medications: Current Facility-Administered Medications  Medication Dose Route Frequency Provider Last Rate Last Dose  .  acetaminophen (TYLENOL) tablet 650 mg  650 mg Oral Q6H PRN [name], MD   650 mg at 09/04/15 1439  . alum & mag hydroxide-simeth (MAALOX/MYLANTA) 200-200-20 MG/5ML suspension 30 mL  30 mL Oral Q4H PRN [name], MD   30 mL at 08/25/15 1951  . amantadine (SYMMETREL) capsule 100 mg  100 mg Oral BID [name], MD   100 mg at 09/06/15 0914  . amLODipine (NORVASC) tablet 2.5 mg  2.5 mg Oral Daily [name], MD   2.5 mg at 09/06/15 0913  .  docusate sodium (COLACE) capsule 200 mg  200 mg Oral BID [name], MD   200 mg at 09/06/15 0913  . fluPHENAZine (PROLIXIN) tablet 5 mg  5 mg Oral 3 times per day [name], MD   5 mg at 09/06/15 0804  . lactulose (CHRONULAC) 10 GM/15ML solution 30 g  30 g Oral TID [name], MD   30 g at 09/06/15 0805  . levothyroxine (SYNTHROID, LEVOTHROID) tablet 75 mcg  75 mcg Oral QAC breakfast [name], MD   75 mcg at 09/06/15 0804  . lurasidone (LATUDA) tablet 120 mg  120 mg Oral Q breakfast [name], MD   120 mg at 09/06/15 0915  . magnesium hydroxide (MILK OF MAGNESIA) suspension 30 mL  30 mL Oral Daily PRN [name], MD   30 mL at 08/24/15 1450  . nicotine (NICODERM CQ - dosed in mg/24 hours) patch 21 mg  21 mg Transdermal Daily [name] [name], MD   21 mg at 09/06/15 0919  . Oxcarbazepine (TRILEPTAL) tablet 150 mg  150 mg Oral BID [name], MD   150 mg at 09/06/15 0917  . polyethylene glycol (MIRALAX / GLYCOLAX) packet 17 g  17 g Oral BID [name], MD   17 g at 09/06/15 0920  . senna (SENOKOT) tablet 17.2 mg  2 tablet Oral QHS [name], MD   17.2 mg at 09/05/15 2130  . traZODone (DESYREL) tablet 100 mg  100 mg Oral QHS [name], MD   100 mg at 09/05/15 2130  . venlafaxine XR (EFFEXOR-XR) 24 hr capsule 150 mg  150 mg Oral Q breakfast [name], MD   150 mg at 09/06/15 [id number]    Lab Results: No results found for this or any previous visit (from the past 48 hour(s)).  Blood Alcohol level:  Lab Results  Component Value Date   ETH <5 07/30/2015   ETH <5 07/13/2015    Physical Findings: AIMS: Facial and Oral Movements Muscles of Facial Expression: None, normal Lips and Perioral Area: None, normal Jaw: None, normal Tongue: None, normal,Extremity Movements Upper (arms, wrists, hands, fingers): None, normal Lower (legs, knees, ankles, toes): None, normal, Trunk Movements Neck, shoulders, hips: None, normal, Overall  Severity Severity of abnormal movements (highest score from questions above): None, normal Incapacitation due to abnormal movements: None, normal Patient's awareness of abnormal movements (rate only patient's report): Aware, no distress, Dental Status Current problems with teeth and/or dentures?: No Does patient usually wear dentures?: No  CIWA:    COWS:     Musculoskeletal: Strength & Muscle Tone: within normal limits Gait & Station: normal Patient leans: N/A  Psychiatric Specialty Exam: Review of Systems  Constitutional: Negative.   HENT: Negative.   Eyes: Negative.   Respiratory: Negative.   Cardiovascular: Negative.   Gastrointestinal: Negative.   Genitourinary: Negative.   Musculoskeletal: Negative.  Skin: Negative.   Neurological: Negative.   Endo/Heme/Allergies: Negative.   Psychiatric/Behavioral: Negative.   All other systems reviewed and are negative.   Blood pressure 123/73, pulse 74, temperature 98.8 F (37.1 C), temperature source Oral, resp. rate 20, height 5\' 6"  (1.676 m), weight 86.183 kg (190 lb), SpO2 100 %.Body mass index is 30.68 kg/(m^2).  General Appearance: Casual  Eye Contact::  Good  Speech:  Clear and Coherent  Volume:  Normal  Mood:  Euthymic  Affect:  Constricted  Thought Process:  Goal Directed  Orientation:  Full (Time, Place, and Person)  Thought Content:  WDL  Suicidal Thoughts:  No  Homicidal Thoughts:  No  Memory:  Immediate;   Fair Recent;   Fair Remote;   Fair  Judgement:  Poor  Insight:  Lacking  Psychomotor Activity:  Normal  Concentration:  Fair  Recall:  [employer] of Knowledge:Fair  Language: Fair  Akathisia:  No  Handed:  Right  AIMS (if indicated):     Assets:  Communication Skills Desire for Improvement Financial Resources/Insurance Housing Resilience Social Support  ADL's:  Intact  Cognition: WNL  Sleep:  Number of Hours: 8.15   Treatment Plan Summary: Daily contact with patient to assess and evaluate  symptoms and progress in treatment and Medication management   [name] is a 25 year old female with a history of schizoaffective disorder admitted for worsening of depression and suicide attempt by swallowing an AA battery, an earing and a screw.   1. Suicidal ideation. This has resolved. The patient is able to contract for safety. She is forward thinking and optimistic about the future.    2. Mood and psychosis. We continued Prolixin 5mg  po TID and Latuda 120mg  po daily for psychosis, Trileptal 150mg  po BID for mood stabilization, and Effexor XR 150mg  po daily for depression.  Patient is a stable no changes will be made to her medication regimen   3. Hypertension. She is on amlodipine 2.5mg  po daily.  4. Constipation. She is on bowel regimen.  Patient ha has had bowel movements every day this weekend.  Patient says she has been moving her bowels once a day.   5. Hypothyroidism. She is on Synthroid 43mcg po daily  6. Smoking. Nicotine patch is available.  7. Insomnia. She responded well to trazodone.  8. Social. She is an incompetent adult. Her Kara Dies is the guardian. She lives in [place].  9. Metabolic syndrome. Lipid panel, hemoglobin A1c, TSH, and PRL were checked during previous admission and were normal. Prolactin 15.   10. Foreign body ingestion. There are three foreign objects present on X-ray. The AA battery was injected on 3/15. She will continue bowel regimen and Lactuose. Input from GI and surgery is greatly appreciated. After talking to Dr. [name], GI, we progressed her diet to soft. There will be no surgical intervention unless there is perforation/peritonitis. X-ray on 09/03/2015 shows movement of foreign objects along the gut.    I have seen and evaluated this patient today. There there will be no changes made to her current management. Patient appears to be stable psychiatrically. At this point were waiting for her to pass foreign objects safely. Patient has had bowel  movements or weekend about 1 per day. Patient is unable to tell whether she has passed the foreign objects are not.  [name], MD 09/06/2015, 10:16 AM

## 2015-09-07 ENCOUNTER — Inpatient Hospital Stay: Payer: MEDICAID

## 2015-09-07 IMAGING — CR DG ABDOMEN 2V
1 series · 2 of 2 positions shown · non-contrast
Comparison: [DATE]

CLINICAL DATA: Followup ingested foreign bodies.

EXAM:
ABDOMEN - 2 VIEW

[Series 1: dg abd 2 views · 0.14mm/px · 2 of 2 slices shown]
[im 1/2]
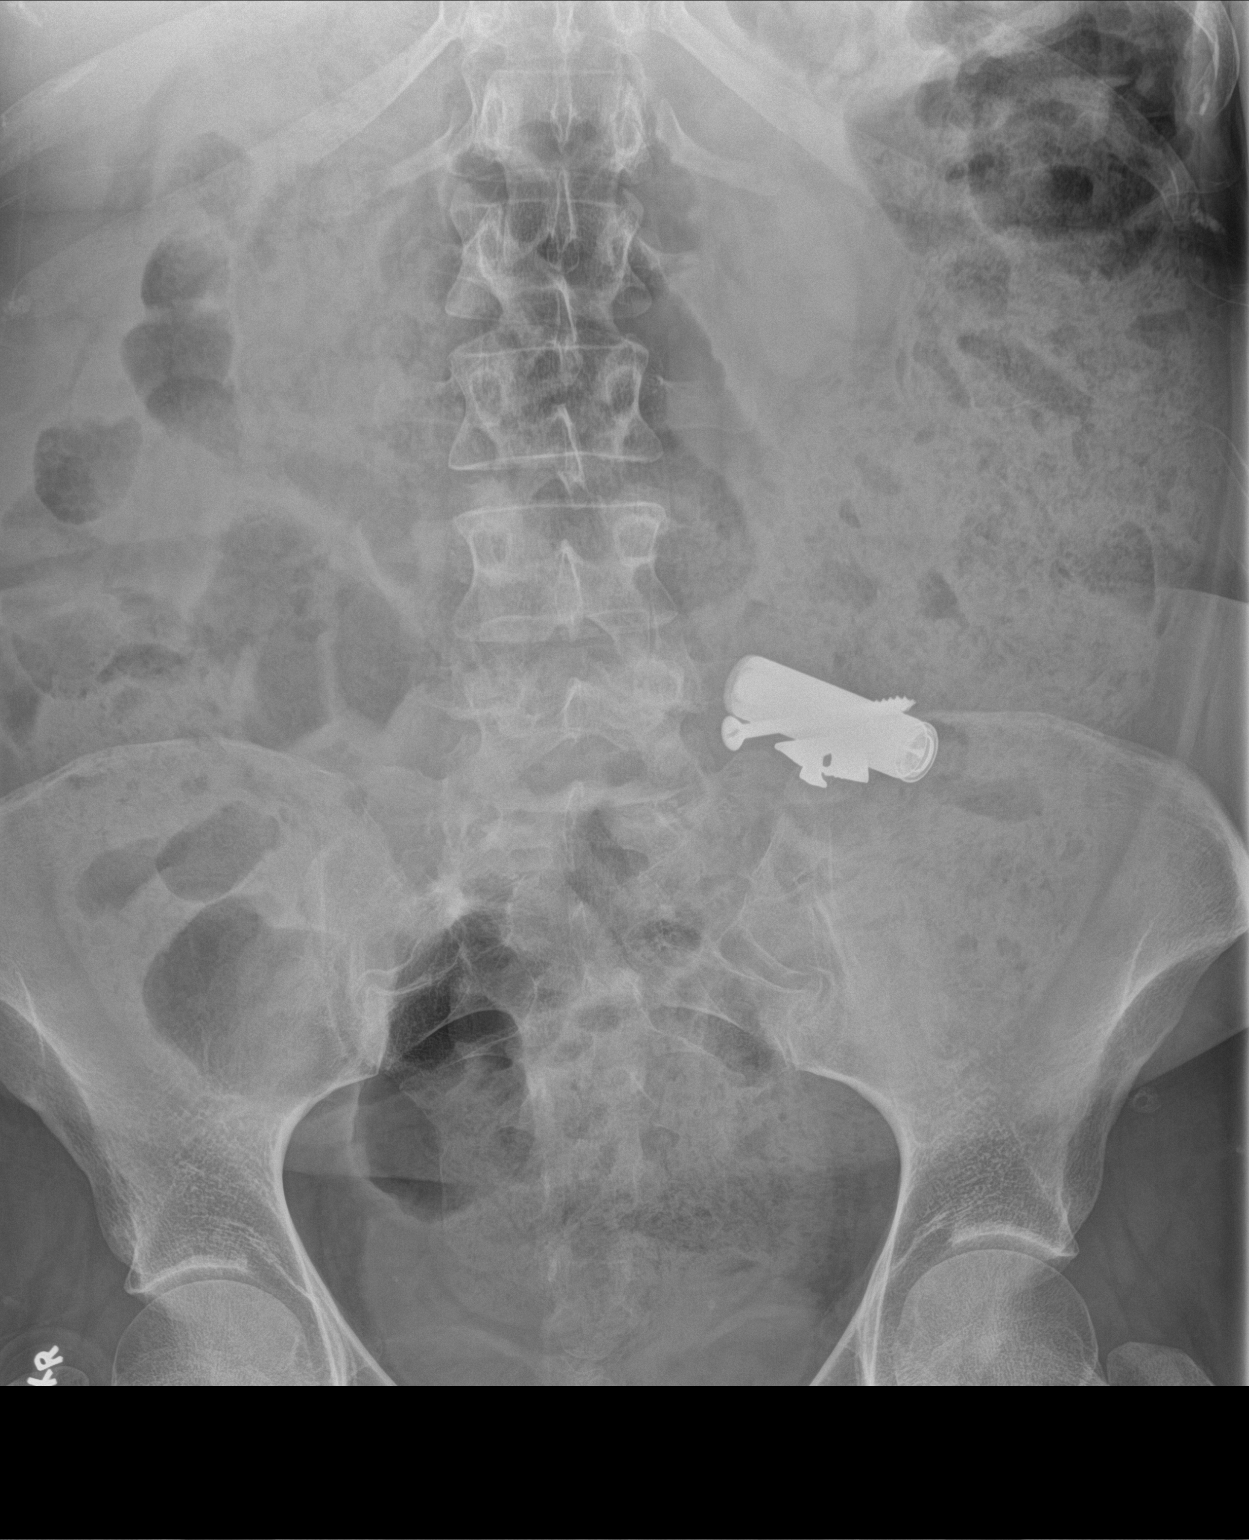
[im 2/2]
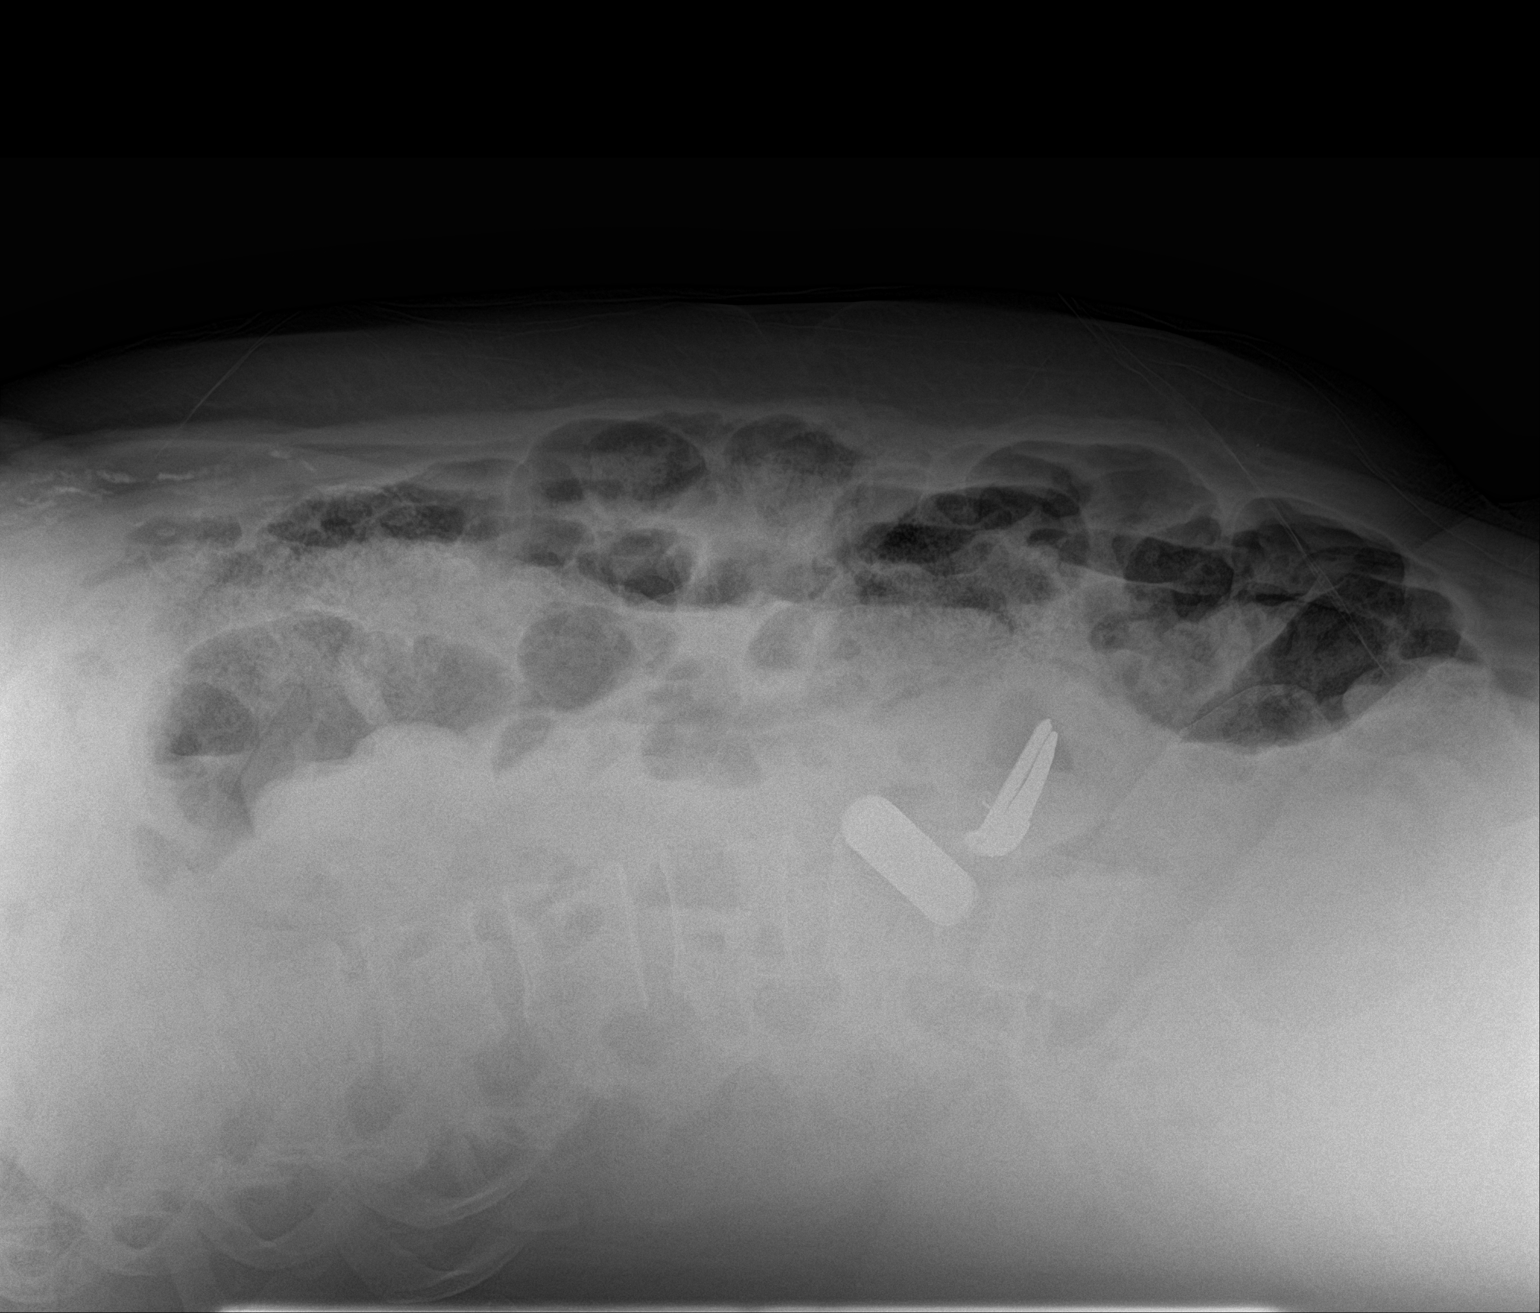

[2 of 2 positions shown; findings below may reference images not displayed]

FINDINGS: Three radiopaque foreign bodies have migrated immediately adjacent
to each other in the left lower quadrant since prior exam. This
could be within left lower quadrant small bowel or descending colon.

There is no evidence of bowel obstruction. Large colonic stool
burden again noted. No evidence of free intraperitoneal air.
IMPRESSION: Three radiopaque foreign bodies have migrated into the same area in
the left lower quadrant. These could be within small bowel or
descending colon.

No evidence of bowel obstruction or free intraperitoneal air.

Large stool burden again noted.

## 2015-09-07 NOTE — Progress Notes (Signed)
[name] Progress Note  09/07/2015 1:23 PM MODEST DUBICKI  MRN:  [name]  Subjective:  [name] denies any symptoms of depression, anxiety, or psychosis. She is not suicidal or homicidal. In spite of multiple laxatives she has had just one bowel movement over the weekend. Abdominal x-ray this morning indicate that all 3 foreign objects are stuck in one place, most likely small bowel. I spoke with Dr. [name], GI, about the possibility of narrowing of the gut from previous surgeries that preclude safe passage of foreign objects. The patient has no somatic complaints.   Principal Problem: Schizoaffective disorder, bipolar type ([name]) Diagnosis:   Patient Active Problem List   Diagnosis Date Noted  . Foreign body alimentary tract [T18.9XXA]   . Foreign body ingestion [T18.9XXA] 07/31/2015  . Schizoaffective disorder, bipolar type ([name]) [F25.0] 11/06/2014  . Tobacco use disorder [F17.200] 09/30/2014  . Hypothyroidism [E03.9] 09/29/2014  . Hypertension [I10] 09/29/2014  . Constipation [K59.00] 09/29/2014   Total Time spent with patient: 20 minutes  Past Psychiatric History: Schizoaffective disorder.   Past Medical History:  Past Medical History  Diagnosis Date  . Hallucinations 09/30/2014    Sizoaffective  . Depression   . Anxiety   . Hypertension   . Tardive dyskinesia 10/2014    recent onset  . GERD (gastroesophageal reflux disease)   . Hyperlipidemia     Past Surgical History  Procedure Laterality Date  . Wisdom tooth extraction    . Esophagogastroduodenoscopy N/A 11/28/2014    Procedure: ESOPHAGOGASTRODUODENOSCOPY (EGD);  Surgeon: [name], MD;  Location: [facility] ENDOSCOPY;  Service: Endoscopy;  Laterality: N/A;  . Abdominal surgery      "years ago" to remove foreign objects  . Breast lumpectomy     Family History:  Family History  Problem Relation Age of Onset  . Depression Mother   . Hypertension Mother    Family Psychiatric  History: Mother with  depression.al History:  History  Alcohol Use No     History  Drug Use No    Social History   Social History  . Marital Status: Single    Spouse Name: N/A  . Number of Children: N/A  . Years of Education: N/A   Social History Main Topics  . Smoking status: Current Every Day Smoker -- 0.50 packs/day for 3 years    Types: Cigarettes  . Smokeless tobacco: Never Used  . Alcohol Use: No  . Drug Use: No  . Sexual Activity: No   Other Topics Concern  . None   Social History Narrative   Additional Social History:    Pain Medications: See PTA Prescriptions: See PTA Over the Counter: See PTA History of alcohol / drug use?: No history of alcohol / drug abuse Longest period of sobriety (when/how long): No history of use                    Sleep: Fair  Appetite:  Fair  Current Medications: Current Facility-Administered Medications  Medication Dose Route Frequency Provider Last Rate Last Dose  . acetaminophen (TYLENOL) tablet 650 mg  650 mg Oral Q6H PRN [name], MD   650 mg at 09/04/15 1439  . alum & mag hydroxide-simeth (MAALOX/MYLANTA) 200-200-20 MG/5ML suspension 30 mL  30 mL Oral Q4H PRN [name], MD   30 mL at 08/25/15 1951  . amantadine (SYMMETREL) capsule 100 mg  100 mg Oral BID [name], MD   100 mg at 09/07/15 0925  . amLODipine (  NORVASC) tablet 2.5 mg  2.5 mg Oral Daily [name], MD   2.5 mg at 09/07/15 [id number]  . docusate sodium (COLACE) capsule 200 mg  200 mg Oral BID [name], MD   200 mg at 09/07/15 0926  . fluPHENAZine (PROLIXIN) tablet 5 mg  5 mg Oral 3 times per day [name], MD   5 mg at 09/07/15 0634  . lactulose (CHRONULAC) 10 GM/15ML solution 30 g  30 g Oral TID [name], MD   30 g at 09/07/15 0923  . levothyroxine (SYNTHROID, LEVOTHROID) tablet 75 mcg  75 mcg Oral QAC breakfast [name], MD   75 mcg at 09/07/15 [phone]  . lurasidone (LATUDA) tablet 120 mg  120 mg Oral Q breakfast [name], MD   120 mg at 09/07/15 0925  . magnesium hydroxide (MILK OF MAGNESIA) suspension 30 mL  30 mL Oral Daily PRN [name], MD   30 mL at 08/24/15 1450  . nicotine (NICODERM CQ - dosed in mg/24 hours) patch 21 mg  21 mg Transdermal Daily [name], MD   21 mg at 09/07/15 0922  . Oxcarbazepine (TRILEPTAL) tablet 150 mg  150 mg Oral BID [name], MD   150 mg at 09/07/15 0926  . polyethylene glycol (MIRALAX / GLYCOLAX) packet 17 g  17 g Oral BID [name], MD   17 g at 09/07/15 [id number]  . senna (SENOKOT) tablet 17.2 mg  2 tablet Oral QHS [name], MD   17.2 mg at 09/06/15 2124  . traZODone (DESYREL) tablet 100 mg  100 mg Oral QHS [name], MD   100 mg at 09/06/15 2124  . venlafaxine XR (EFFEXOR-XR) 24 hr capsule 150 mg  150 mg Oral Q breakfast [name], MD   150 mg at 09/07/15 [id number]    Lab Results: No results found for this or any previous visit (from the past 48 hour(s)).  Blood Alcohol level:  Lab Results  Component Value Date   ETH <5 07/30/2015   ETH <5 07/13/2015    Physical Findings: AIMS: Facial and Oral Movements Muscles of Facial Expression: None, normal Lips and Perioral Area: None, normal Jaw: None, normal Tongue: None, normal,Extremity Movements Upper (arms, wrists, hands, fingers): None, normal Lower (legs, knees, ankles, toes): None, normal, Trunk Movements Neck, shoulders, hips: None, normal, Overall Severity Severity of abnormal movements (highest score from questions above): None, normal Incapacitation due to abnormal movements: None, normal Patient's awareness of abnormal movements (rate only patient's report): Aware, no distress, Dental Status Current problems with teeth and/or dentures?: No Does patient usually wear dentures?: No  CIWA:    COWS:     Musculoskeletal: Strength & Muscle Tone: within normal limits Gait & Station: normal Patient leans: N/A  Psychiatric Specialty Exam: Review of Systems   Gastrointestinal: Positive for constipation.  All other systems reviewed and are negative.   Blood pressure 124/78, pulse 74, temperature 98.5 F (36.9 C), temperature source Oral, resp. rate 18, height 5\' 6"  (1.676 m), weight 86.183 kg (190 lb), SpO2 100 %.Body mass index is 30.68 kg/(m^2).  General Appearance: Casual  Eye Contact::  Good  Speech:  Clear and Coherent  Volume:  Normal  Mood:  Euthymic  Affect:  Appropriate  Thought Process:  Goal Directed  Orientation:  Full (Time, Place, and Person)  Thought Content:  WDL  Suicidal Thoughts:  No  Homicidal Thoughts:  No  Memory:  Immediate;   Fair Recent;   Fair Remote;   Fair  Judgement:  Impaired  Insight:  Lacking  Psychomotor Activity:  Normal  Concentration:  Fair  Recall:  [place]  Language: Fair  Akathisia:  No  Handed:  Right  AIMS (if indicated):     Assets:  Communication Skills Desire for Improvement Financial Resources/Insurance Housing Resilience Social Support  ADL's:  Intact  Cognition: WNL  Sleep:  Number of Hours: 7.3   Treatment Plan Summary: Daily contact with patient to assess and evaluate symptoms and progress in treatment and Medication management   [name] is a 25 year old female with a history of schizoaffective disorder admitted for worsening of depression and suicide attempt by swallowing an AA battery, an earing and a screw.   1. Suicidal ideation. This has resolved. The patient is able to contract for safety. She is forward thinking and optimistic about the future.    2. Mood and psychosis. We continued Prolixin 5mg  po TID and Latuda 120mg  po daily for psychosis, Trileptal 150mg  po BID for mood stabilization, and Effexor XR 150mg  po daily for depression. Patient is a stable no changes will be made to her medication regimen   3. Hypertension. She is on amlodipine 2.5mg  po daily.  4. Constipation. She is on bowel regimen. Patient ha has had bowel movements  every day this weekend. Patient says she has been moving her bowels once a day.   5. Hypothyroidism. She is on Synthroid 63mcg po daily  6. Smoking. Nicotine patch is available.  7. Insomnia. She responded well to trazodone.  8. Social. She is an incompetent adult. Her Kara Dies is the guardian. She lives in [place].  9. Metabolic syndrome. Lipid panel, hemoglobin A1c, TSH, and PRL were checked during previous admission and were normal. Prolactin 15.   10. Foreign body ingestion. There are three foreign objects present on X-ray. The AA battery was injested on 3/15. She will continue bowel regimen, Lactuose and soft diet. Input from GI and surgery is greatly appreciated.  There will be no surgical intervention unless there is perforation/peritonitis. X-ray on 09/07/2015 shows conglomeration of foreign objects in the gut.    Orson Slick, MD 09/07/2015, 1:23 PM

## 2015-09-07 NOTE — [facility] Group Notes (Signed)
[place] Group Notes:  (Nursing/MHT/Case Management/Adjunct)  Date:  09/07/2015  Time:  9:35 PM  Type of Therapy:  Group Therapy  Participation Level:  Active  Participation Quality:  Appropriate  Affect:  Appropriate  Cognitive:  Appropriate  Insight:  Appropriate  Engagement in Group:  Engaged  Modes of Intervention:  Discussion  Summary of Progress/Problems:  Melanie Bell 09/07/2015, 9:35 PM

## 2015-09-07 NOTE — Consult Note (Signed)
Subjective: Patient seen for multiple foreign body ingestion. Patient denies any abdominal pain. She has apparently not passed any of this objects yet. She has a history of chronic constipation as well. She has been using twice daily MiraLAX as well as occasional mag citrate since her hospitalization.  Objective: Vital signs in last 24 hours: Temp:  [98.5 F (36.9 C)] 98.5 F (36.9 C) 17-Sep-2022 0700) Pulse Rate:  [74] 74 09-17-2022 0700) Resp:  [18] 18 Sep 17, 2022 0700) BP: (124)/(78) 124/78 mmHg 09-17-2022 0700) Blood pressure 124/78, pulse 74, temperature 98.5 F (36.9 C), temperature source Oral, resp. rate 18, height 5\' 6"  (1.676 m), weight 86.183 kg (190 lb), SpO2 100 %.   Intake/Output from previous day: 04/23 0701 - 09/17/22 0700 In: 840 [P.O.:840] Out: -   Intake/Output this shift: Total I/O In: 480 [P.O.:480] Out: -    General appearance:  4 female no distress Resp: bilaterally clear to auscultation Cardio:  Regular rate and rhythm GI:  Soft nondistended nontender bowel sounds are positive normoactive, protuberant body habitus. Extremities:  No clubbing cyanosis or edema   Lab Results: No results found for this or any previous visit (from the past 24 hour(s)).   No results for input(s): WBC, HGB, HCT, PLT in the last 72 hours. BMET No results for input(s): NA, K, CL, CO2, GLUCOSE, BUN, CREATININE, CALCIUM in the last 72 hours. LFT No results for input(s): PROT, ALBUMIN, AST, ALT, ALKPHOS, BILITOT, BILIDIR, IBILI in the last 72 hours. PT/INR No results for input(s): LABPROT, INR in the last 72 hours. Hepatitis Panel No results for input(s): HEPBSAG, HCVAB, HEPAIGM, HEPBIGM in the last 72 hours. C-Diff No results for input(s): CDIFFTOX in the last 72 hours. No results for input(s): CDIFFPCR in the last 72 hours.   Studies/Results: Dg Abd 2 Views  17-Sep-2015  CLINICAL DATA:  Followup ingested foreign bodies. EXAM: ABDOMEN - 2 VIEW COMPARISON:  09/03/2015 FINDINGS: Three  radiopaque foreign bodies have migrated immediately adjacent to each other in the left lower quadrant since prior exam. This could be within left lower quadrant small bowel or descending colon. There is no evidence of bowel obstruction. Large colonic stool burden again noted. No evidence of free intraperitoneal air. IMPRESSION: Three radiopaque foreign bodies have migrated into the same area in the left lower quadrant. These could be within small bowel or descending colon. No evidence of bowel obstruction or free intraperitoneal air. Large stool burden again noted. Electronically Signed   By: Earle Gell M.D.   On: 09-17-15 12:45    Scheduled Inpatient Medications:   . amantadine  100 mg Oral BID  . amLODipine  2.5 mg Oral Daily  . docusate sodium  200 mg Oral BID  . fluPHENAZine  5 mg Oral 3 times per day  . lactulose  30 g Oral TID  . levothyroxine  75 mcg Oral QAC breakfast  . lurasidone  120 mg Oral Q breakfast  . nicotine  21 mg Transdermal Daily  . Oxcarbazepine  150 mg Oral BID  . polyethylene glycol  17 g Oral BID  . senna  2 tablet Oral QHS  . traZODone  100 mg Oral QHS  . venlafaxine XR  150 mg Oral Q breakfast    Continuous Inpatient Infusions:     PRN Inpatient Medications:  acetaminophen, alum & mag hydroxide-simeth, magnesium hydroxide  Miscellaneous:   Assessment:  1. Multiple foreign body ingestion. These objects seem to be suspended toward the left lower quadrant. They have been read on  films as variably being in the small bowel or the colon. I discussed this case earlier today with radiology. They've recommended we do a a noncontrasted CT to assist in localization.  Plan:  1. As noted above further recommendations follow.  [name] MD 09/07/2015, 2:26 PM

## 2015-09-07 NOTE — [facility] Group Notes (Signed)
[place] Group Notes:  (Nursing/MHT/Case Management/Adjunct)  Date:  09/07/2015  Time:  4:09 PM  Type of Therapy:  Psychoeducational Skills  Participation Level:  Active  Participation Quality:  Appropriate, Attentive and Sharing  Affect:  Appropriate  Cognitive:  Alert and Appropriate  Insight:  Appropriate  Engagement in Group:  Engaged  Modes of Intervention:  Discussion, Education and Support  Summary of Progress/Problems:  Adela Lank [facility] 09/07/2015, 4:09 PM

## 2015-09-07 NOTE — Progress Notes (Signed)
Recreation Therapy Notes  Date: 04.24.17 Time: 1:00 pm Location: Craft Room  Group Topic: Self-expression  Goal Area(s) Addresses:  Patient will identify one color per emotion listed on wheel. Patient will verbalize benefit of using art as a means of self-expression. Patient will verbalize one emotion experienced during group. Patient will be educated on other forms of self-expression.  Behavioral Response: Did not attend  Intervention: Emotion Wheel  Activity: Patients were given an Emotion Wheel worksheet and instructed to pick a color for each emotion listed on the wheel.  Education: LRT educated patients on other forms of self-expression.  Education Outcome: Patient did not attend group.  Clinical Observations/Feedback: Patient did not attend group.  Carolene Gitto M, LRT/CTRS 09/07/2015 2:34 PM 

## 2015-09-07 NOTE — Progress Notes (Signed)
Patient rated her depression 10/10.Stated that her depression is because being in the hospital for a long time and she never waited in the past this long to pass the objects.Encouraged patient to get out of bed.Denies suicidal or homicidal ideations.Appropriate with staff & peers.Compliant with medications.

## 2015-09-07 NOTE — Progress Notes (Signed)
Pt's mood is improved this evening with bright affect. Pt visible in milieu, denies any depression/SH/HI/VAH. Pt said she regrets her actions swallowing objects, that she could have died. Pt remains compliant with medications and treatment will continue to monitor for safety.

## 2015-09-08 ENCOUNTER — Inpatient Hospital Stay: Payer: MEDICAID

## 2015-09-08 IMAGING — CT CT ABD-PELV W/O CM
2 of 4 series · 15 of 46 positions shown, 17 images · non-contrast
Comparison: CT abdomen pelvis dated [DATE]

CLINICAL DATA: Ingested foreign objects, prior appendectomy

EXAM:
CT ABDOMEN AND PELVIS WITHOUT CONTRAST
TECHNIQUE: Multidetector CT imaging of the abdomen and pelvis was performed
following the standard protocol without IV contrast.

[Series 2: routine abd pel wo · axial · 0.73mm/px · z∈[-950,-510]mm · 12 of 98 slices shown, 14 images]
[im 5/98  soft-tissue]
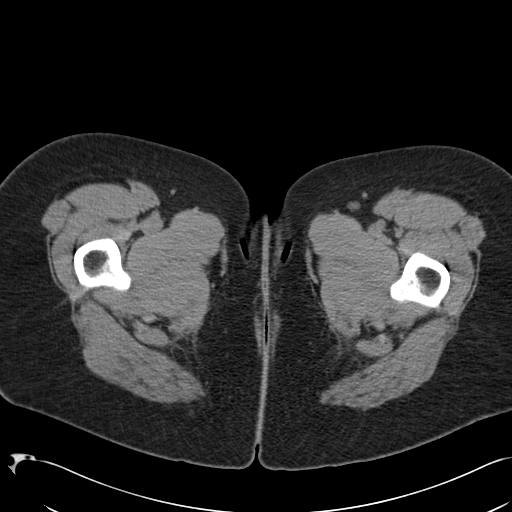
[im 5/98  bone]
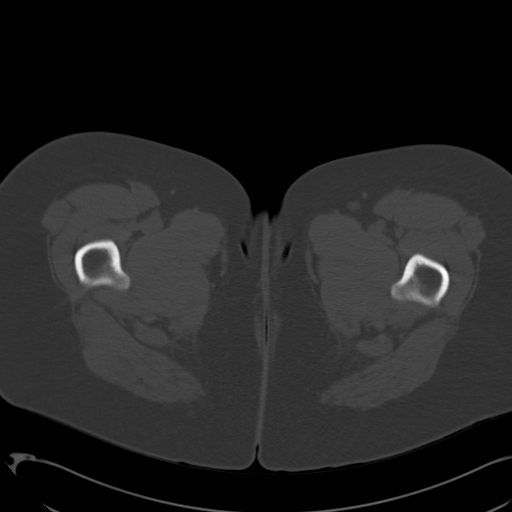
[im 13/98  soft-tissue]
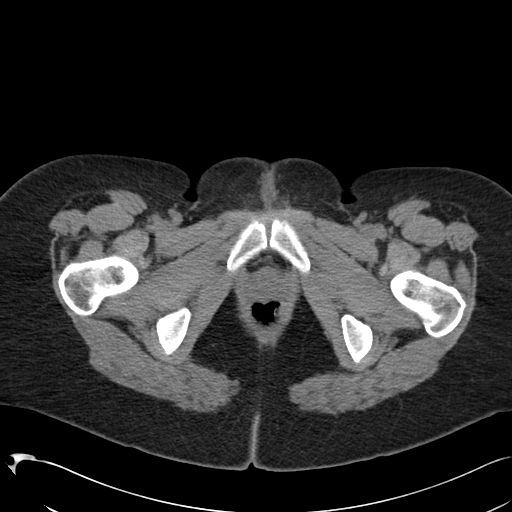
[im 22/98  soft-tissue]
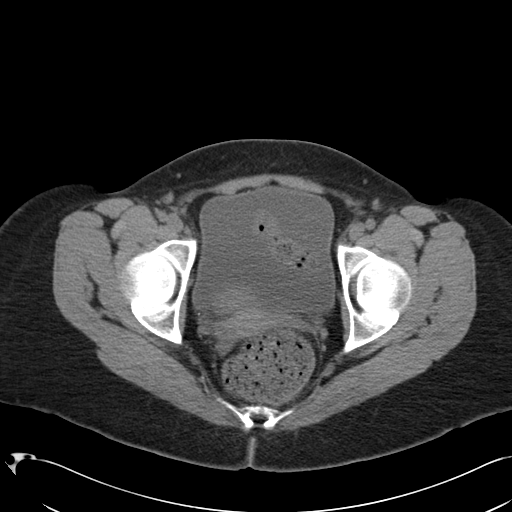
[im 30/98  soft-tissue]
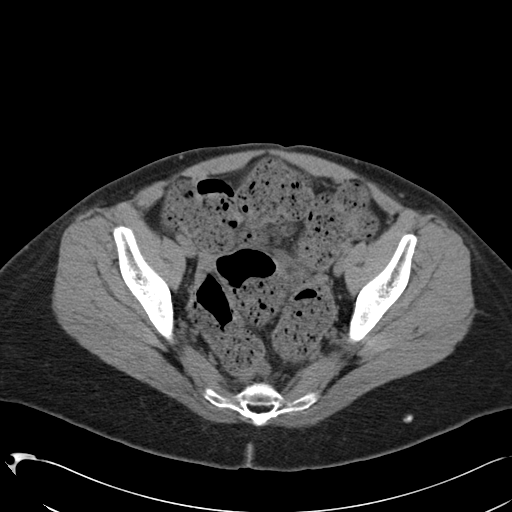
[im 38/98  soft-tissue]
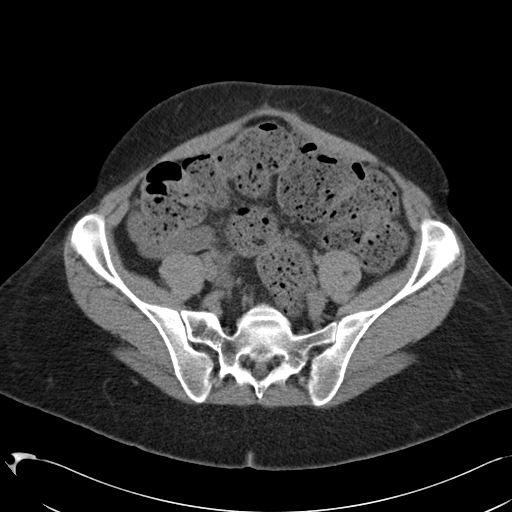
[im 47/98  soft-tissue]
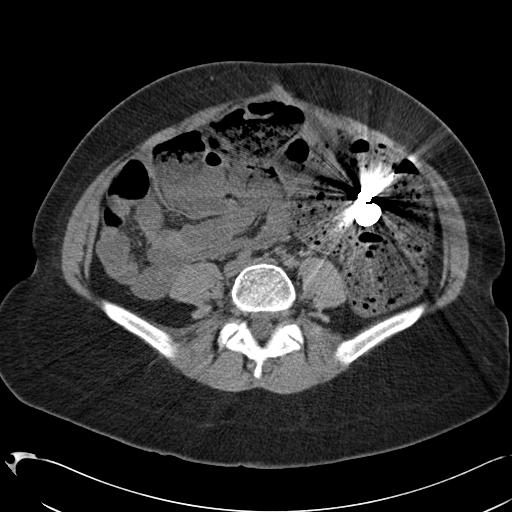
[im 51/98  soft-tissue]
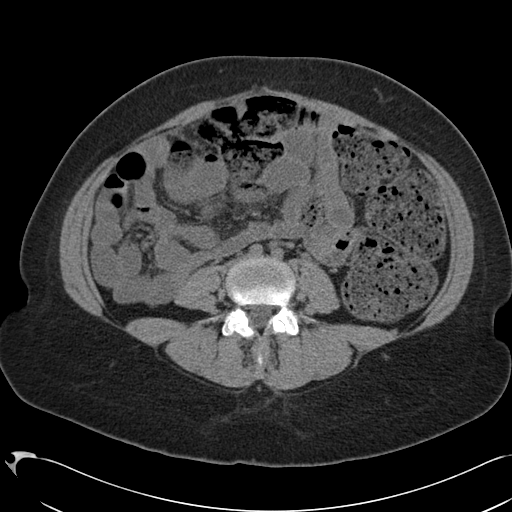
[im 60/98  soft-tissue]
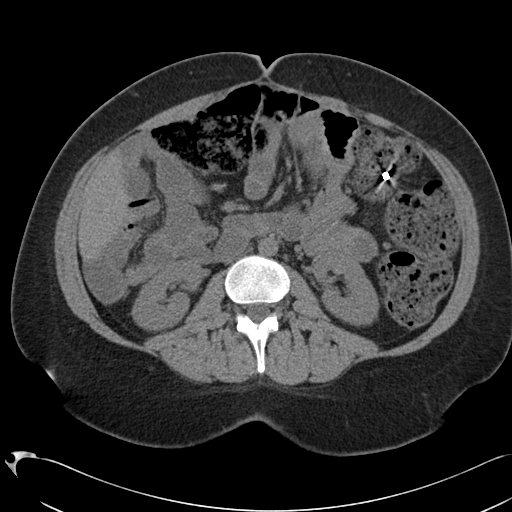
[im 68/98  soft-tissue]
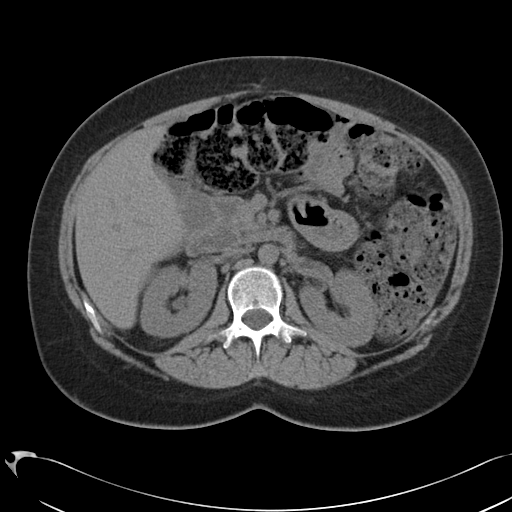
[im 68/98  bone]
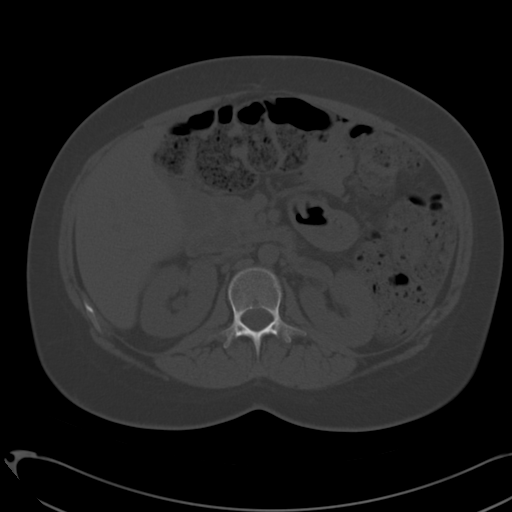
[im 76/98  soft-tissue]
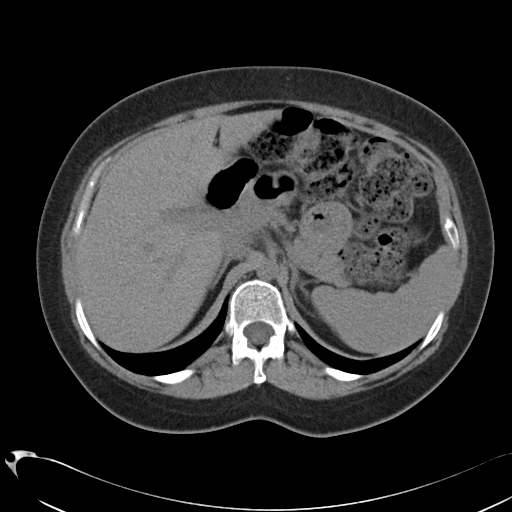
[im 85/98  soft-tissue]
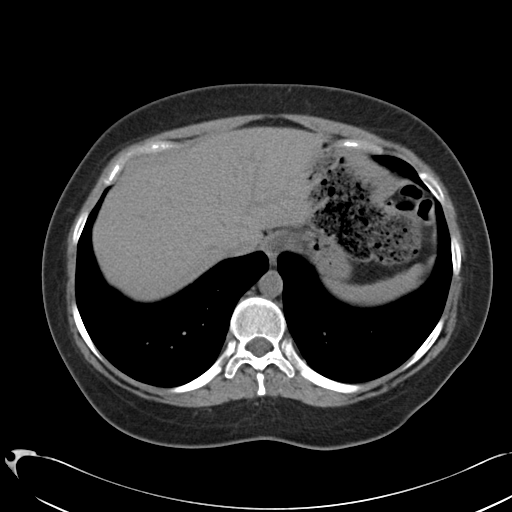
[im 93/98  soft-tissue]
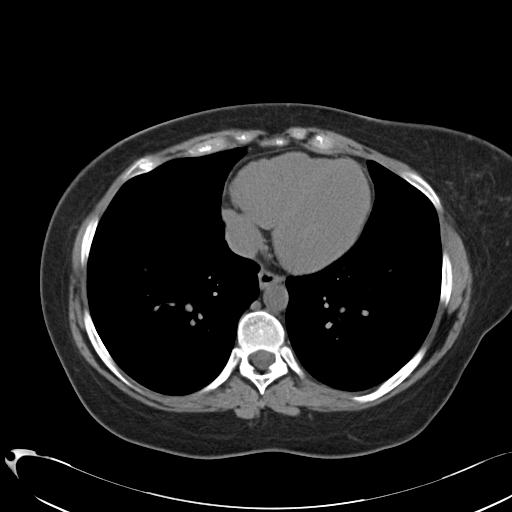

[Series 5: cor routine abd pel wo · coronal · 0.71mm/px · 3 of 152 slices shown]
[im 51/152  soft-tissue]
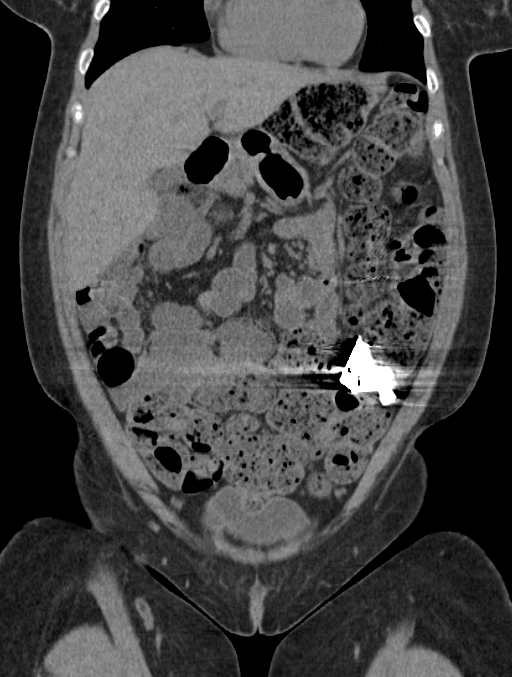
[im 68/152  soft-tissue]
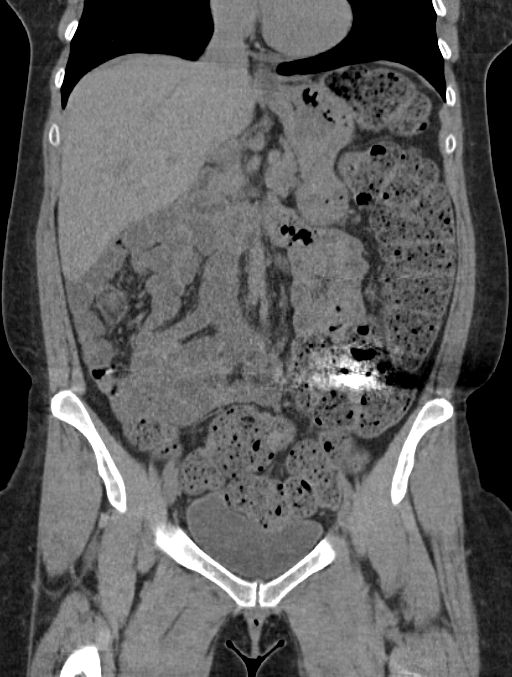
[im 84/152  soft-tissue]
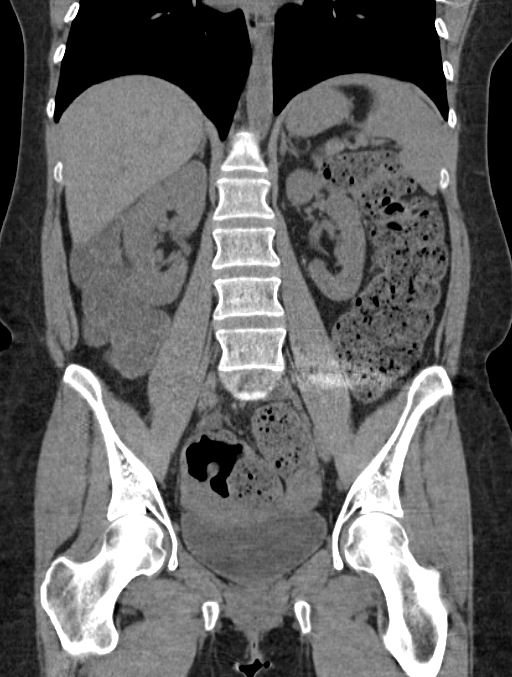

[15 of 46 positions shown; findings below may reference images not displayed]

FINDINGS: Lower chest: 3 mm triangular subpleural lymph node at the right lung
base (series 4/image 8), benign.

Hepatobiliary: Liver is within normal limits.

Gallbladder is underdistended but unremarkable. No intrahepatic or
extrahepatic ductal dilatation.

Pancreas: Within normal limits.

Spleen: Within normal limits.

Adrenals/Urinary Tract: Adrenal glands are within normal limits.

Kidneys are within normal limits. No renal, ureteral, or bladder
calculi. No hydronephrosis.

Bladder is within normal limits.

Stomach/Bowel: Stomach is within normal limits.

No evidence of bowel obstruction.

Moderate colonic stool burden, suggesting constipation.

Three metallic ingested foreign bodies are within the left colon,
and the nature of these items are best visualized on the scout
radiograph. Metallic nail is present in the proximal descending
colon in the left mid abdomen (series 2/ image 40). Battery is
present within the distal descending colon/ proximal sigmoid colon
in the left lower abdomen (series 2/ image 58). Additional
triangular metallic clip is present within the distal descending
colon/ proximal sigmoid colon in the left lower abdomen,
degenerating significant streak artifact (series 2/ image 55).

Notably, there is no associated bowel wall thickening or
inflammatory changes. No free air to suggest perforation.

Vascular/Lymphatic: No evidence of abdominal aortic aneurysm.

No suspicious abdominopelvic lymphadenopathy.

Reproductive: Uterus is within normal limits.

Bilateral ovaries are within normal limits.

Other: No abdominopelvic ascites.

Musculoskeletal: Visualized osseous structures are within normal
limits.
IMPRESSION: Metallic nail within the proximal descending colon in the left mid
abdomen.

Battery within the distal descending colon/proximal sigmoid colon in
the left lower abdomen.

Triangular metallic clip within the distal descending colon/
proximal sigmoid colon in the left lower abdomen.

No bowel wall thickening, abdominopelvic ascites, or free air to
suggest perforation.

## 2015-09-08 MED ORDER — POLYETHYLENE GLYCOL 3350 17 GM/SCOOP PO POWD
1.0000 | Freq: Once | ORAL | Status: AC
  Administered 2015-09-08: 255 g via ORAL
  Filled 2015-09-08: qty 255

## 2015-09-08 NOTE — Progress Notes (Signed)

## 2015-09-08 NOTE — Plan of Care (Signed)
Problem: Alteration in thought process Goal: STG-Patient is able to sleep at least 6 hours per night Outcome: Progressing Patient is able to sleep at least 6 hours per night currently

## 2015-09-08 NOTE — Plan of Care (Signed)
Problem: Alteration in thought process Goal: LTG-Patient verbalizes understanding importance med regimen (Patient verbalizes understanding of importance of medication regimen and need to continue outpatient care.)  Outcome: Progressing Patient verbalizes understanding of the importance of her medication regimen currently

## 2015-09-08 NOTE — Consult Note (Addendum)
Subjective: Patient seen for multiple ingested foreign bodies. Denies n/v or abdominal pain, has not passed any of the three foreign bodies yet.   Objective: Vital signs in last 24 hours: Temp:  [98.6 F (37 C)] 98.6 F (37 C) (04/25 0500) Pulse Rate:  [72] 72 (04/25 0500) Resp:  [18] 18 (04/25 0500) BP: (114)/(66) 114/66 mmHg (04/25 0500) Blood pressure 114/66, pulse 72, temperature 98.6 F (37 C), temperature source Oral, resp. rate 18, height 5\' 6"  (1.676 m), weight 86.183 kg (190 lb), SpO2 100 %.   Intake/Output from previous day: 04/24 0701 - 04/25 0700 In: 840 [P.O.:840] Out: -   Intake/Output this shift: Total I/O In: 240 [P.O.:240] Out: -    General appearance:  Well appearing 25 f no distress Resp:  bcta Cardio:  rrr GI:  Soft nontender nondistended bowel sounds positive, normoactive.  Extremities:  No cce   Lab Results: No results found for this or any previous visit (from the past 24 hour(s)).   No results for input(s): WBC, HGB, HCT, PLT in the last 72 hours. BMET No results for input(s): NA, K, CL, CO2, GLUCOSE, BUN, CREATININE, CALCIUM in the last 72 hours. LFT No results for input(s): PROT, ALBUMIN, AST, ALT, ALKPHOS, BILITOT, BILIDIR, IBILI in the last 72 hours. PT/INR No results for input(s): LABPROT, INR in the last 72 hours. Hepatitis Panel No results for input(s): HEPBSAG, HCVAB, HEPAIGM, HEPBIGM in the last 72 hours. C-Diff No results for input(s): CDIFFTOX in the last 72 hours. No results for input(s): CDIFFPCR in the last 72 hours.   Studies/Results: Ct Abdomen Pelvis Wo Contrast  09/08/2015  CLINICAL DATA:  Ingested foreign objects, prior appendectomy EXAM: CT ABDOMEN AND PELVIS WITHOUT CONTRAST TECHNIQUE: Multidetector CT imaging of the abdomen and pelvis was performed following the standard protocol without IV contrast. COMPARISON:  CT abdomen pelvis dated 07/30/2015 FINDINGS: Lower chest: 3 mm triangular subpleural lymph node at the right  lung base (series 4/image 8), benign. Hepatobiliary: Liver is within normal limits. Gallbladder is underdistended but unremarkable. No intrahepatic or extrahepatic ductal dilatation. Pancreas: Within normal limits. Spleen: Within normal limits. Adrenals/Urinary Tract: Adrenal glands are within normal limits. Kidneys are within normal limits. No renal, ureteral, or bladder calculi. No hydronephrosis. Bladder is within normal limits. Stomach/Bowel: Stomach is within normal limits. No evidence of bowel obstruction. Moderate colonic stool burden, suggesting constipation. Three metallic ingested foreign bodies are within the left colon, and the nature of these items are best visualized on the scout radiograph. Metallic nail is present in the proximal descending colon in the left mid abdomen (series 2/ image 40). Battery is present within the distal descending colon/ proximal sigmoid colon in the left lower abdomen (series 2/ image 58). Additional triangular metallic clip is present within the distal descending colon/ proximal sigmoid colon in the left lower abdomen, degenerating significant streak artifact (series 2/ image 93). Notably, there is no associated bowel wall thickening or inflammatory changes. No free air to suggest perforation. Vascular/Lymphatic: No evidence of abdominal aortic aneurysm. No suspicious abdominopelvic lymphadenopathy. Reproductive: Uterus is within normal limits. Bilateral ovaries are within normal limits. Other: No abdominopelvic ascites. Musculoskeletal: Visualized osseous structures are within normal limits. IMPRESSION: Metallic nail within the proximal descending colon in the left mid abdomen. Battery within the distal descending colon/proximal sigmoid colon in the left lower abdomen. Triangular metallic clip within the distal descending colon/ proximal sigmoid colon in the left lower abdomen. No bowel wall thickening, abdominopelvic ascites, or free air to suggest  perforation.  Electronically Signed   By: Julian Hy M.D.   On: 09/08/2015 09:32   Dg Abd 2 Views  09/07/2015  CLINICAL DATA:  Followup ingested foreign bodies. EXAM: ABDOMEN - 2 VIEW COMPARISON:  09/03/2015 FINDINGS: Three radiopaque foreign bodies have migrated immediately adjacent to each other in the left lower quadrant since prior exam. This could be within left lower quadrant small bowel or descending colon. There is no evidence of bowel obstruction. Large colonic stool burden again noted. No evidence of free intraperitoneal air. IMPRESSION: Three radiopaque foreign bodies have migrated into the same area in the left lower quadrant. These could be within small bowel or descending colon. No evidence of bowel obstruction or free intraperitoneal air. Large stool burden again noted. Electronically Signed   By: Earle Gell M.D.   On: 09/07/2015 12:45    Scheduled Inpatient Medications:   . amantadine  100 mg Oral BID  . amLODipine  2.5 mg Oral Daily  . docusate sodium  200 mg Oral BID  . fluPHENAZine  5 mg Oral 3 times per day  . lactulose  30 g Oral TID  . levothyroxine  75 mcg Oral QAC breakfast  . lurasidone  120 mg Oral Q breakfast  . nicotine  21 mg Transdermal Daily  . Oxcarbazepine  150 mg Oral BID  . polyethylene glycol  17 g Oral BID  . senna  2 tablet Oral QHS  . traZODone  100 mg Oral QHS  . venlafaxine XR  150 mg Oral Q breakfast    Continuous Inpatient Infusions:     PRN Inpatient Medications:  acetaminophen, alum & mag hydroxide-simeth, magnesium hydroxide  Miscellaneous:   Assessment:  1) multiple ingested foreign bodies. Asymptomatic, no evidence of obstruction. Minimal movement over a week.  CT today indicates all are tin the left colon.  Patient has a rather redundant distal left colon which will make colonoscopic/mechanical  removal higher risk for complication. It would be better if able to pass without intervention.  Patient with chronic constipation, poor response to  mag citrate and bid miralax  Plan:  1) will do a miralax prep as for colonoscopy but will split the dosing, half tonight and half tomorrow. Patietn has had a miralax prep in the past and well tolerated.   [name] MD 09/08/2015, 4:50 PM

## 2015-09-08 NOTE — Progress Notes (Signed)
Recreation Therapy Notes  Date: 04.25.17 Time: 2:00 pm Location: Craft Room  Group Topic: Goal Setting  Goal Area(s) Addresses:  Patient will write at least one goal. Patient will write at least one obstacle.  Behavioral Response: Attentive, Interactive  Intervention: Recovery Goal Chart  Activity: Patients were instructed to make a Recovery Goal Chart including goals towards their recovery, obstacles, the date they started working on their goal, and the date they achieved their goal.  Education: LRT educated patients on healthy ways to celebrate reaching their goals.  Education Outcome: Acknowledges education/In group clarification offered  Clinical Observations/Feedback: Patient completed activity by writing goals and obstacles. Patient contributed to group discussion by stating ways she can keep herself focused on her goals, how she can overcome her obstacles, that she has people in her life to help her stay focused on her goals, and healthy ways she can celebrate reaching her goals.  Leonette Monarch, LRT/CTRS 09/08/2015 3:29 PM

## 2015-09-08 NOTE — Progress Notes (Addendum)
[facility] MD Progress Note  09/08/2015 9:34 AM Melanie Bell  MRN:  [id number]  Subjective: [name] reports no psychiatric symptoms. She scored and collected. She tolerates medications well. She participates in programming. Abdomen CT scan results are still pending. There were no bowel movements. Keven met with treatment team this morning she was able to ask questions about her situation. The results of abdominal CT scan back and they show very slow progression of falling objects in her belly. I will talk to doctor [name] about the options. At this point I feel that this is unfair to keep the patient in the hospital. She's been here for over 40 days.   Principal Problem: Schizoaffective disorder, bipolar type ([place]) Diagnosis:   Patient Active Problem List   Diagnosis Date Noted  . Foreign body alimentary tract [T18.9XXA]   . Foreign body ingestion [T18.9XXA] 07/31/2015  . Schizoaffective disorder, bipolar type ([place]) [F25.0] 11/06/2014  . Tobacco use disorder [F17.200] 09/30/2014  . Hypothyroidism [E03.9] 09/29/2014  . Hypertension [I10] 09/29/2014  . Constipation [K59.00] 09/29/2014   Total Time spent with patient: 20 minutes  Past Psychiatric History: Schizoaffective disorder.  Past Medical History:  Past Medical History  Diagnosis Date  . Hallucinations 09/30/2014    Sizoaffective  . Depression   . Anxiety   . Hypertension   . Tardive dyskinesia 10/2014    recent onset  . GERD (gastroesophageal reflux disease)   . Hyperlipidemia     Past Surgical History  Procedure Laterality Date  . Wisdom tooth extraction    . Esophagogastroduodenoscopy N/A 11/28/2014    Procedure: ESOPHAGOGASTRODUODENOSCOPY (EGD);  Surgeon: [name], MD;  [facility] END[facility]COPY;  Service: Endoscopy;  Laterality: N/A;  . Abdominal surgery      "years ago" to remove foreign objects  . Breast lumpectomy     Family History:  Family History  Problem Relation Age of Onset  . Depression  Mother   . Hypertension Mother    Family Psychiatric  History: Mother with depression. Social History:  History  Alcohol Use No     History  Drug Use No    Social History   Social History  . Marital Status: Single    Spouse Name: N/A  . Number of Children: N/A  . Years of Education: N/A   Social History Main Topics  . Smoking status: Current Every Day Smoker -- 0.50 packs/day for 3 years    Types: Cigarettes  . Smokeless tobacco: Never Used  . Alcohol Use: No  . Drug Use: No  . Sexual Activity: No   Other Topics Concern  . None   Social History Narrative   Additional Social History:    Pain Medications: See PTA Prescriptions: See PTA Over the Counter: See PTA History of alcohol / drug use?: No history of alcohol / drug abuse Longest period of sobriety (when/how long): No history of use                    Sleep: Fair  Appetite:  Fair  Current Medications: Current Facility-Administered Medications  Medication Dose Route Frequency Provider Last Rate Last Dose  . acetaminophen (TYLENOL) tablet 650 mg  650 mg Oral Q6H PRN [name], MD   650 mg at 09/04/15 1439  . alum & mag hydroxide-simeth (MAALOX/MYLANTA) 200-200-20 MG/5ML suspension 30 mL  30 mL Oral Q4H PRN [name], MD   30 mL at 08/25/15 1951  . amantadine (SYMMETREL) capsule 100 mg  100  mg Oral BID [name], MD   100 mg at 09/08/15 0819  . amLODipine (NORVASC) tablet 2.5 mg  2.5 mg Oral Daily [name], MD   2.5 mg at 09/08/15 0819  . docusate sodium (COLACE) capsule 200 mg  200 mg Oral BID [name], MD   200 mg at 09/08/15 0819  . fluPHENAZine (PROLIXIN) tablet 5 mg  5 mg Oral 3 times per day [name], MD   5 mg at 09/08/15 2637  . lactulose (CHRONULAC) 10 GM/15ML solution 30 g  30 g Oral TID [name], MD   30 g at 09/08/15 0818  . levothyroxine (SYNTHROID, LEVOTHROID) tablet 75 mcg  75 mcg Oral QAC breakfast [name], MD   75 mcg at  09/08/15 [phone]  . lurasidone (LATUDA) tablet 120 mg  120 mg Oral Q breakfast [name], MD   120 mg at 09/08/15 0818  . magnesium hydroxide (MILK OF MAGNESIA) suspension 30 mL  30 mL Oral Daily PRN [name], MD   30 mL at 08/24/15 1450  . nicotine (NICODERM CQ - dosed in mg/24 hours) patch 21 mg  21 mg Transdermal Daily [name], MD   21 mg at 09/07/15 0922  . Oxcarbazepine (TRILEPTAL) tablet 150 mg  150 mg Oral BID [name], MD   150 mg at 09/08/15 0819  . polyethylene glycol (MIRALAX / GLYCOLAX) packet 17 g  17 g Oral BID [name], MD   17 g at 09/08/15 0817  . senna (SENOKOT) tablet 17.2 mg  2 tablet Oral QHS [name], MD   17.2 mg at 09/07/15 2128  . traZODone (DESYREL) tablet 100 mg  100 mg Oral QHS [name], MD   100 mg at 09/07/15 2127  . venlafaxine XR (EFFEXOR-XR) 24 hr capsule 150 mg  150 mg Oral Q breakfast [name], MD   150 mg at 09/08/15 5027    Lab Results: No results found for this or any previous visit (from the past 48 hour(s)).  Blood Alcohol level:  Lab Results  Component Value Date   ETH <5 07/30/2015   ETH <5 07/13/2015    Physical Findings: AIMS: Facial and Oral Movements Muscles of Facial Expression: None, normal Lips and Perioral Area: None, normal Jaw: None, normal Tongue: None, normal,Extremity Movements Upper (arms, wrists, hands, fingers): None, normal Lower (legs, knees, ankles, toes): None, normal, Trunk Movements Neck, shoulders, hips: None, normal, Overall Severity Severity of abnormal movements (highest score from questions above): None, normal Incapacitation due to abnormal movements: None, normal Patient's awareness of abnormal movements (rate only patient's report): Aware, no distress, Dental Status Current problems with teeth and/or dentures?: No Does patient usually wear dentures?: No  CIWA:    COWS:     Musculoskeletal: Strength & Muscle Tone: within normal limits Gait & Station:  normal Patient leans: N/A  Psychiatric Specialty Exam: Review of Systems  Gastrointestinal: Positive for constipation.  All other systems reviewed and are negative.   Blood pressure 114/66, pulse 72, temperature 98.6 F (37 C), temperature source Oral, resp. rate 18, height 5' 6"  (1.676 m), weight 86.183 kg (190 lb), SpO2 100 %.Body mass index is 30.68 kg/(m^2).  General Appearance: Casual  Eye Contact::  Good  Speech:  Clear and Coherent  Volume:  Normal  Mood:  Euthymic  Affect:  Appropriate  Thought Process:  Goal Directed  Orientation:  Full (Time, Place, and Person)  Thought Content:  WDL  Suicidal Thoughts:  No  Homicidal Thoughts:  No  Memory:  Immediate;   Fair Recent;   Fair Remote;   Fair  Judgement:  Impaired  Insight:  Shallow  Psychomotor Activity:  Normal  Concentration:  Fair  Recall:  [place]  Language: Fair  Akathisia:  No  Handed:  Right  AIMS (if indicated):     Assets:  Communication Skills Desire for Improvement Financial Resources/Insurance Housing Resilience Social Support  ADL's:  Intact  Cognition: WNL  Sleep:  Number of Hours: 7   Treatment Plan Summary: Daily contact with patient to assess and evaluate symptoms and progress in treatment and Medication management   [name] is a 25 year old female with a history of schizoaffective disorder admitted for worsening of depression and suicide attempt by swallowing an AA battery, an earing and a screw.   1. Suicidal ideation. This has resolved. The patient is able to contract for safety. She is forward thinking and optimistic about the future.    2. Mood and psychosis. We continued Prolixin 9m po TID and Latuda 1268mpo daily for psychosis, Trileptal 15018mo BID for mood stabilization, and Effexor XR 150m44m daily for depression. Patient is a stable no changes will be made to her medication regimen   3. Hypertension. She is on amlodipine 2.5mg 30mdaily.  4.  Constipation. She is on bowel regimen. Patient ha has had bowel movements every day this weekend. Patient says she has been moving her bowels once a day.   5. Hypothyroidism. She is on Synthroid 75mcg5mdaily  6. Smoking. Nicotine patch is available.  7. Insomnia. She responded well to trazodone.  8. Social. She is an incompetent adult. Her mather[place]ara Diese guardian[place] Lipid panel, hemoglobin A1c, TSH, and PRL were checked during previous admission and were normal. Prolactin 15.   10. Foreign body ingestion. There are three foreign objects present on X-ray. The AA battery was injested on 3/15. She will continue bowel regimen, Lactuose and soft diet. Input from GI and surgery is greatly appreciated. There will be no surgical intervention unless there is perforation/peritonitis. X-ray on 09/07/2015 shows conglomeration of foreign objects in the gut. CT to clarify position of foreign objects in the gut performed this morning. Results are pending.     JolantOrson Slick/25/2017, 9:34 AM

## 2015-09-08 NOTE — [facility] Group Notes (Signed)
[facility] Group Notes:  (Nursing/MHT/Case Management/Adjunct)  Date:  09/08/2015  Time:  4:00 PM  Type of Therapy:  Psychoeducational Skills  Participation Level:  Did Not Attend   Adela Lank [facility] 09/08/2015, 4:00 PM

## 2015-09-08 NOTE — Progress Notes (Signed)
D: Pt reported that she had not had a bowel movement today. Denied any pain or discomfort. Attended evening groups. Denies SI.  A: Encouragement and support provided. Medications given as prescribed. Q15 minute checks maintained for safety. R: Pt remains safe on unit. Will continue to monitor.

## 2015-09-09 MED ORDER — POLYETHYLENE GLYCOL 3350 17 GM/SCOOP PO POWD
1.0000 | Freq: Once | ORAL | Status: AC
  Administered 2015-09-09: 255 g via ORAL
  Filled 2015-09-09: qty 255

## 2015-09-09 NOTE — Consult Note (Signed)
Subjective: Patient seen for possible foreign body ingestion. Patient had multiple bowel movements and response to the colon prep last night and this morning. She is still having some brown stool. Does have a history of chronic constipation. She denies any nausea or abdominal pain.  Objective: Vital signs in last 24 hours: Temp:  [98.2 F (36.8 C)] 98.2 F (36.8 C) (04/26 0500) Pulse Rate:  [73] 73 (04/26 0500) Resp:  [18] 18 (04/26 0500) BP: (97)/(72) 97/72 mmHg (04/26 0500) Blood pressure 97/72, pulse 73, temperature 98.2 F (36.8 C), temperature source Oral, resp. rate 18, height 5\' 6"  (1.676 m), weight 86.183 kg (190 lb), SpO2 100 %.   Intake/Output from previous day: 04/25 0701 - 04/26 0700 In: 720 [P.O.:720] Out: -   Intake/Output this shift: Total I/O In: 600 [P.O.:600] Out: -    General appearance:  Well-appearing 3 female no distress Resp:  Clear to auscultation Cardio:  Regular rate and rhythm GI:  Soft nontender nondistended bowel sounds positive normoactive. Extremities:  No clubbing cyanosis or edema   Lab Results: No results found for this or any previous visit (from the past 24 hour(s)).   No results for input(s): WBC, HGB, HCT, PLT in the last 72 hours. BMET No results for input(s): NA, K, CL, CO2, GLUCOSE, BUN, CREATININE, CALCIUM in the last 72 hours. LFT No results for input(s): PROT, ALBUMIN, AST, ALT, ALKPHOS, BILITOT, BILIDIR, IBILI in the last 72 hours. PT/INR No results for input(s): LABPROT, INR in the last 72 hours. Hepatitis Panel No results for input(s): HEPBSAG, HCVAB, HEPAIGM, HEPBIGM in the last 72 hours. C-Diff No results for input(s): CDIFFTOX in the last 72 hours. No results for input(s): CDIFFPCR in the last 72 hours.   Studies/Results: Ct Abdomen Pelvis Wo Contrast  09/08/2015  CLINICAL DATA:  Ingested foreign objects, prior appendectomy EXAM: CT ABDOMEN AND PELVIS WITHOUT CONTRAST TECHNIQUE: Multidetector CT imaging of the  abdomen and pelvis was performed following the standard protocol without IV contrast. COMPARISON:  CT abdomen pelvis dated 07/30/2015 FINDINGS: Lower chest: 3 mm triangular subpleural lymph node at the right lung base (series 4/image 8), benign. Hepatobiliary: Liver is within normal limits. Gallbladder is underdistended but unremarkable. No intrahepatic or extrahepatic ductal dilatation. Pancreas: Within normal limits. Spleen: Within normal limits. Adrenals/Urinary Tract: Adrenal glands are within normal limits. Kidneys are within normal limits. No renal, ureteral, or bladder calculi. No hydronephrosis. Bladder is within normal limits. Stomach/Bowel: Stomach is within normal limits. No evidence of bowel obstruction. Moderate colonic stool burden, suggesting constipation. Three metallic ingested foreign bodies are within the left colon, and the nature of these items are best visualized on the scout radiograph. Metallic nail is present in the proximal descending colon in the left mid abdomen (series 2/ image 40). Battery is present within the distal descending colon/ proximal sigmoid colon in the left lower abdomen (series 2/ image 58). Additional triangular metallic clip is present within the distal descending colon/ proximal sigmoid colon in the left lower abdomen, degenerating significant streak artifact (series 2/ image 66). Notably, there is no associated bowel wall thickening or inflammatory changes. No free air to suggest perforation. Vascular/Lymphatic: No evidence of abdominal aortic aneurysm. No suspicious abdominopelvic lymphadenopathy. Reproductive: Uterus is within normal limits. Bilateral ovaries are within normal limits. Other: No abdominopelvic ascites. Musculoskeletal: Visualized osseous structures are within normal limits. IMPRESSION: Metallic nail within the proximal descending colon in the left mid abdomen. Battery within the distal descending colon/proximal sigmoid colon in the left lower abdomen.  Triangular metallic clip within the distal descending colon/ proximal sigmoid colon in the left lower abdomen. No bowel wall thickening, abdominopelvic ascites, or free air to suggest perforation. Electronically Signed   By: Julian Hy M.D.   On: 09/08/2015 09:32    Scheduled Inpatient Medications:   . amantadine  100 mg Oral BID  . amLODipine  2.5 mg Oral Daily  . docusate sodium  200 mg Oral BID  . fluPHENAZine  5 mg Oral 3 times per day  . lactulose  30 g Oral TID  . levothyroxine  75 mcg Oral QAC breakfast  . lurasidone  120 mg Oral Q breakfast  . nicotine  21 mg Transdermal Daily  . Oxcarbazepine  150 mg Oral BID  . polyethylene glycol  17 g Oral BID  . senna  2 tablet Oral QHS  . traZODone  100 mg Oral QHS  . venlafaxine XR  150 mg Oral Q breakfast    Continuous Inpatient Infusions:     PRN Inpatient Medications:  acetaminophen, alum & mag hydroxide-simeth, magnesium hydroxide  Miscellaneous:   Assessment:  1. Multiple foreign body ingestion. Material has now been in the digestive tract since 07/31/2015. All efforts at using bowel preps, cathartics have not been beneficial. Return to normal diet followed by cathartics has not been beneficial. Although 80-90% of ingested foreign bodies will pass on their own these seem to be fairly recalcitrant in that. She has had no symptoms.  Plan:  1. We'll repeat the colon prep again tonight to make sure we have a very good prep and proceed with colonoscopy tomorrow. I have discussed the risks benefits and complications of procedures to include not limited to bleeding, infection, perforation and the risk of sedation and the patient wishes to proceed. I have discussed with patient that she is at higher risk for complications such as perforation and bleeding then a usual colonoscopy. sHe indicates he understands this and is willing to proceed.  [name] MD 09/09/2015, 1:21 PM

## 2015-09-09 NOTE — Progress Notes (Signed)
D: Patient appears bright on the unit. She interacts with staff and peers. Patient states no pain in abdomen. No BM this evening. Patient denies SI/HI/AVH. No other complaints noted.  A: Medication was given with education. Patient was started on first half of 255 g miralax prep. Encouragement was provided.  R: Patient was compliant with medication. She has remained calm and cooperative. Safety maintained with 15 min checks.

## 2015-09-09 NOTE — Progress Notes (Signed)
Recreation Therapy Notes  Date: 04.26.17 Time: 1:00 pm Location: Craft Room  Group Topic: Self-esteem  Goal Area(s) Addresses:  Patient will identify positive traits about self. Patient will identify at least one healthy coping skill.  Behavioral Response: Did not attend  Intervention: All About Me  Activity: Patients instructed to make an All About Me pamphlet including their life's motto, positive traits about themselves, healthy coping skills, and their healthy support system.  Education: LRT educated patients on ways they can increase their self-esteem.  Education Outcome: Patient did not attend group.   Clinical Observations/Feedback: Patient did not attend group.  Leonette Monarch, LRT/CTRS 09/09/2015 2:13 PM

## 2015-09-09 NOTE — Plan of Care (Signed)
Problem: Diagnosis: Increased Risk For Suicide Attempt Goal: LTG-Patient Will Report Improved Mood and Deny Suicidal LTG (by discharge) Patient will report improved mood and deny suicidal ideation.  Outcome: Progressing Patient is bright on the unit and interacts well with staff and peers.

## 2015-09-09 NOTE — Plan of Care (Signed)
Problem: Diagnosis: Increased Risk For Suicide Attempt Goal: LTG-Patient Will Show Positive Response to Medication LTG (by discharge) : Patient will show positive response to medication and will participate in the development of the discharge plan.  Outcome: Not Progressing Pt has not passed objects

## 2015-09-09 NOTE — Progress Notes (Addendum)
[facility] MD Progress Note  09/09/2015 1:41 PM Melanie Bell  MRN:  [id number]  Subjective:  [name] denies any symptoms of depression, anxiety, psychosis. She is not suicidal or homicidal. She tolerates medications well and participates in programming. There are no somatic complaints. Dr. [name] visited with her yesterday following an abdominal CT scan in order MiraLAX colonoscopy prep. Colonoscopy scheduled for tomorrow. Her mother who is the guardian is informed and in agreement. We anticipate to discharge this patient tomorrow after successful evacuation of foreign bodies.   Principal Problem: Schizoaffective disorder, bipolar type ([place]) Diagnosis:   Patient Active Problem List   Diagnosis Date Noted  . Foreign body alimentary tract [T18.9XXA]   . Foreign body ingestion [T18.9XXA] 07/31/2015  . Schizoaffective disorder, bipolar type ([place]) [F25.0] 11/06/2014  . Tobacco use disorder [F17.200] 09/30/2014  . Hypothyroidism [E03.9] 09/29/2014  . Hypertension [I10] 09/29/2014  . Constipation [K59.00] 09/29/2014   Total Time spent with patient: 20 minutes  Past Psychiatric History: Schizoaffective disorder. Past Medical History:  Past Medical History  Diagnosis Date  . Hallucinations 09/30/2014    Sizoaffective  . Depression   . Anxiety   . Hypertension   . Tardive dyskinesia 10/2014    recent onset  . GERD (gastroesophageal reflux disease)   . Hyperlipidemia     Past Surgical History  Procedure Laterality Date  . Wisdom tooth extraction    . Esophagogastroduodenoscopy N/A 11/28/2014    Procedure: ESOPHAGOGASTRODUODENOSCOPY (EGD);  Surgeon: [name], MD;  Location: [facility] ENDOSCOPY;  Service: Endoscopy;  Laterality: N/A;  . Abdominal surgery      "years ago" to remove foreign objects  . Breast lumpectomy     Family History:  Family History  Problem Relation Age of Onset  . Depression Mother   . Hypertension Mother    Family Psychiatric  History:Mother with  depression.ocial History:  History  Alcohol Use No     History  Drug Use No    Social History   Social History  . Marital Status: Single    Spouse Name: N/A  . Number of Children: N/A  . Years of Education: N/A   Social History Main Topics  . Smoking status: Current Every Day Smoker -- 0.50 packs/day for 3 years    Types: Cigarettes  . Smokeless tobacco: Never Used  . Alcohol Use: No  . Drug Use: No  . Sexual Activity: No   Other Topics Concern  . None   Social History Narrative   Additional Social History:    Pain Medications: See PTA Prescriptions: See PTA Over the Counter: See PTA History of alcohol / drug use?: No history of alcohol / drug abuse Longest period of sobriety (when/how long): No history of use                    Sleep: Fair  Appetite:  Fair  Current Medications: Current Facility-Administered Medications  Medication Dose Route Frequency Provider Last Rate Last Dose  . acetaminophen (TYLENOL) tablet 650 mg  650 mg Oral Q6H PRN [name], MD   650 mg at 09/04/15 1439  . alum & mag hydroxide-simeth (MAALOX/MYLANTA) 200-200-20 MG/5ML suspension 30 mL  30 mL Oral Q4H PRN [name], MD   30 mL at 08/25/15 1951  . amantadine (SYMMETREL) capsule 100 mg  100 mg Oral BID [name], MD   100 mg at 09/09/15 0925  . amLODipine (NORVASC) tablet 2.5 mg  2.5 mg Oral Daily [name]  [name], MD   2.5 mg at 09/09/15 0925  . docusate sodium (COLACE) capsule 200 mg  200 mg Oral BID [name], MD   200 mg at 09/09/15 [id number]  . fluPHENAZine (PROLIXIN) tablet 5 mg  5 mg Oral 3 times per day [name], MD   5 mg at 09/09/15 0618  . lactulose (CHRONULAC) 10 GM/15ML solution 30 g  30 g Oral TID [name], MD   30 g at 09/09/15 0927  . levothyroxine (SYNTHROID, LEVOTHROID) tablet 75 mcg  75 mcg Oral QAC breakfast [name], MD   75 mcg at 09/09/15 0618  . lurasidone (LATUDA) tablet 120 mg  120 mg Oral Q breakfast [name], MD   120 mg at 09/09/15 0925  . magnesium hydroxide (MILK OF MAGNESIA) suspension 30 mL  30 mL Oral Daily PRN [name], MD   30 mL at 08/24/15 1450  . nicotine (NICODERM CQ - dosed in mg/24 hours) patch 21 mg  21 mg Transdermal Daily [name], MD   21 mg at 09/07/15 0922  . Oxcarbazepine (TRILEPTAL) tablet 150 mg  150 mg Oral BID [name], MD   150 mg at 09/09/15 0926  . polyethylene glycol (MIRALAX / GLYCOLAX) packet 17 g  17 g Oral BID [name], MD   17 g at 09/09/15 [id number]  . senna (SENOKOT) tablet 17.2 mg  2 tablet Oral QHS [name], MD   17.2 mg at 09/08/15 2115  . traZODone (DESYREL) tablet 100 mg  100 mg Oral QHS [name], MD   100 mg at 09/08/15 2114  . venlafaxine XR (EFFEXOR-XR) 24 hr capsule 150 mg  150 mg Oral Q breakfast [name], MD   150 mg at 09/09/15 [id number]    Lab Results: No results found for this or any previous visit (from the past 48 hour(s)).  Blood Alcohol level:  Lab Results  Component Value Date   ETH <5 07/30/2015   ETH <5 07/13/2015    Physical Findings: AIMS: Facial and Oral Movements Muscles of Facial Expression: None, normal Lips and Perioral Area: None, normal Jaw: None, normal Tongue: None, normal,Extremity Movements Upper (arms, wrists, hands, fingers): None, normal Lower (legs, knees, ankles, toes): None, normal, Trunk Movements Neck, shoulders, hips: None, normal, Overall Severity Severity of abnormal movements (highest score from questions above): None, normal Incapacitation due to abnormal movements: None, normal Patient's awareness of abnormal movements (rate only patient's report): Aware, no distress, Dental Status Current problems with teeth and/or dentures?: No Does patient usually wear dentures?: No  CIWA:    COWS:     Musculoskeletal: Strength & Muscle Tone: within normal limits Gait & Station: normal Patient leans: N/A  Psychiatric Specialty Exam: Review of Systems  All other  systems reviewed and are negative.   Blood pressure 97/72, pulse 73, temperature 98.2 F (36.8 C), temperature source Oral, resp. rate 18, height 5\' 6"  (1.676 m), weight 86.183 kg (190 lb), SpO2 100 %.Body mass index is 30.68 kg/(m^2).  General Appearance: Casual  Eye Contact::  Fair  Speech:  Normal Rate  Volume:  Normal  Mood:  Euthymic  Affect:  Appropriate  Thought Process:  Goal Directed  Orientation:  Full (Time, Place, and Person)  Thought Content:  WDL  Suicidal Thoughts:  No  Homicidal Thoughts:  No  Memory:  Immediate;   Fair Recent;   Fair Remote;   Fair  Judgement:  Impaired  Insight:  Lacking  Psychomotor Activity:  Normal  Concentration:  Fair  Recall:  [place]  Language: Fair  Akathisia:  No  Handed:  Right  AIMS (if indicated):     Assets:  Communication Skills Desire for Improvement Financial Resources/Insurance Housing Resilience Social Support  ADL's:  Intact  Cognition: WNL  Sleep:  Number of Hours: 7.75   Treatment Plan Summary: Daily contact with patient to assess and evaluate symptoms and progress in treatment and Medication management   [name] is a 25 year old female with a history of schizoaffective disorder admitted for worsening of depression and suicide attempt by swallowing an AA battery, an earing and a screw.   1. Suicidal ideation. This has resolved. The patient is able to contract for safety. She is forward thinking and optimistic about the future.    2. Mood and psychosis. We continued Prolixin 5mg  po TID and Latuda 120mg  po daily for psychosis, Trileptal 150mg  po BID for mood stabilization, and Effexor XR 150mg  po daily for depression. Patient is a stable no changes will be made to her medication regimen   3. Hypertension. She is on amlodipine 2.5mg  po daily.  4. Constipation. She is on bowel regimen. Patient ha has had bowel movements every day this weekend. Patient says she has been moving her bowels  once a day.   5. Hypothyroidism. She is on Synthroid 54mcg po daily  6. Smoking. Nicotine patch is available.  7. Insomnia. She responded well to trazodone.  8. Social. She is an incompetent adult. Her Kara Dies is the guardian. She lives in [place].  9. Metabolic syndrome. Lipid panel, hemoglobin A1c, TSH, and PRL were checked during previous admission and were normal. Prolactin 15.   10. Foreign body ingestion. There are three foreign objects present on X-ray. The AA battery was injested on 3/15. She will continue bowel regimen, Lactuose and soft diet. Input from GI and surgery is greatly appreciated. There will be no surgical intervention unless there is perforation/peritonitis. CT scan completed on 09/08/2015.    Orson Slick, MD 09/09/2015, 1:41 PM

## 2015-09-09 NOTE — Progress Notes (Signed)
Pt to start miralax prep this evening and complete in morning. Pt did have bowel movement this a.m. BM checked no objects noted only dark brown stool. No complaints of pain or discomfort. Pt denies the PICA urge. Encouragement and support offered. Pt remains safe on unit with q 15 min checks.

## 2015-09-10 ENCOUNTER — Inpatient Hospital Stay: Admission: RE | Admit: 2015-09-10 | Payer: MEDICAID | Source: Intra-hospital | Admitting: Psychiatry

## 2015-09-10 ENCOUNTER — Inpatient Hospital Stay: Payer: MEDICAID | Admitting: Anesthesiology

## 2015-09-10 ENCOUNTER — Inpatient Hospital Stay: Payer: MEDICAID

## 2015-09-10 ENCOUNTER — Encounter: Disposition: A | Discharge status: Home / Self Care | Payer: Self-pay | Attending: Psychiatry

## 2015-09-10 ENCOUNTER — Encounter: Payer: Self-pay | Admitting: *Deleted

## 2015-09-10 DIAGNOSIS — T184XXA Foreign body in colon, initial encounter: Secondary | ICD-10-CM

## 2015-09-10 DIAGNOSIS — T189XXA Foreign body of alimentary tract, part unspecified, initial encounter: Secondary | ICD-10-CM

## 2015-09-10 HISTORY — PX: COLONOSCOPY WITH PROPOFOL: SHX5780

## 2015-09-10 LAB — POCT PREGNANCY, URINE: Preg Test, Ur: NEGATIVE

## 2015-09-10 IMAGING — CR DG ABDOMEN 2V
1 series · 3 of 3 positions shown · non-contrast
Comparison: CT on [DATE]

CLINICAL DATA: Followup GI foreign bodies.

EXAM:
ABDOMEN - 2 VIEW

[Series 11: t abdomen supine · 0.14mm/px · 3 of 3 slices shown]
[im 1/3]
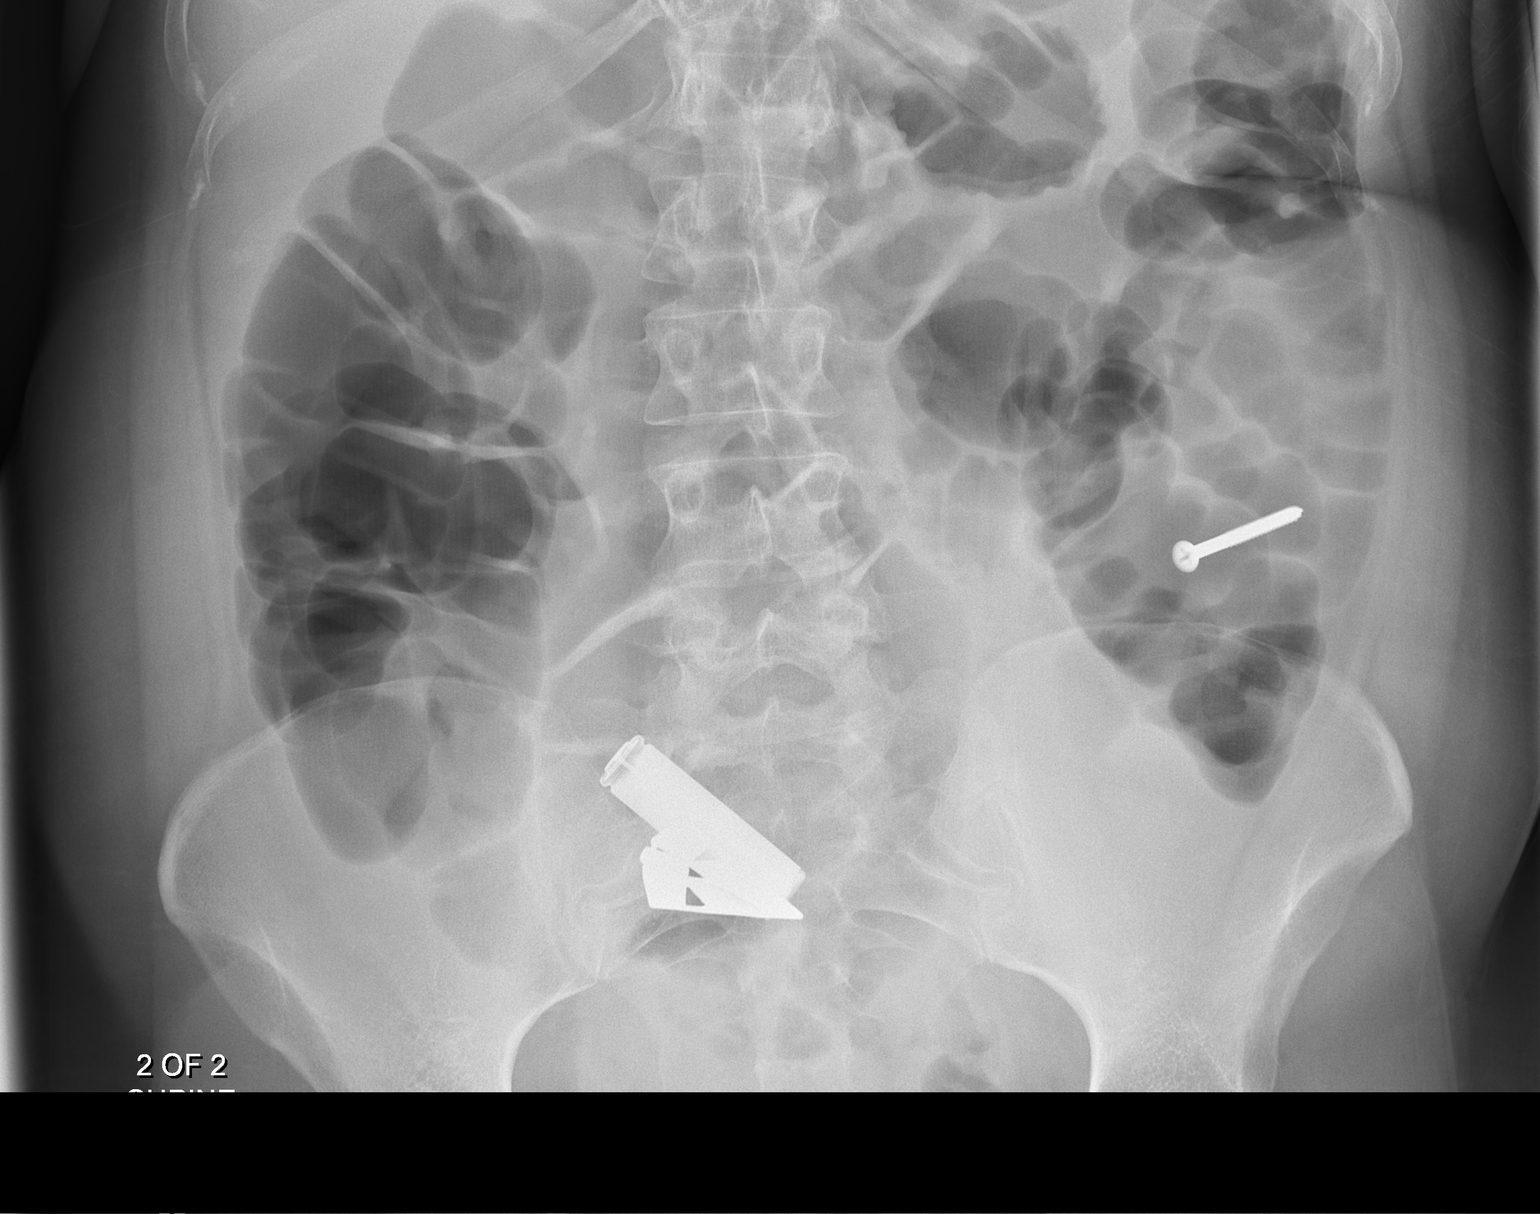
[im 2/3]
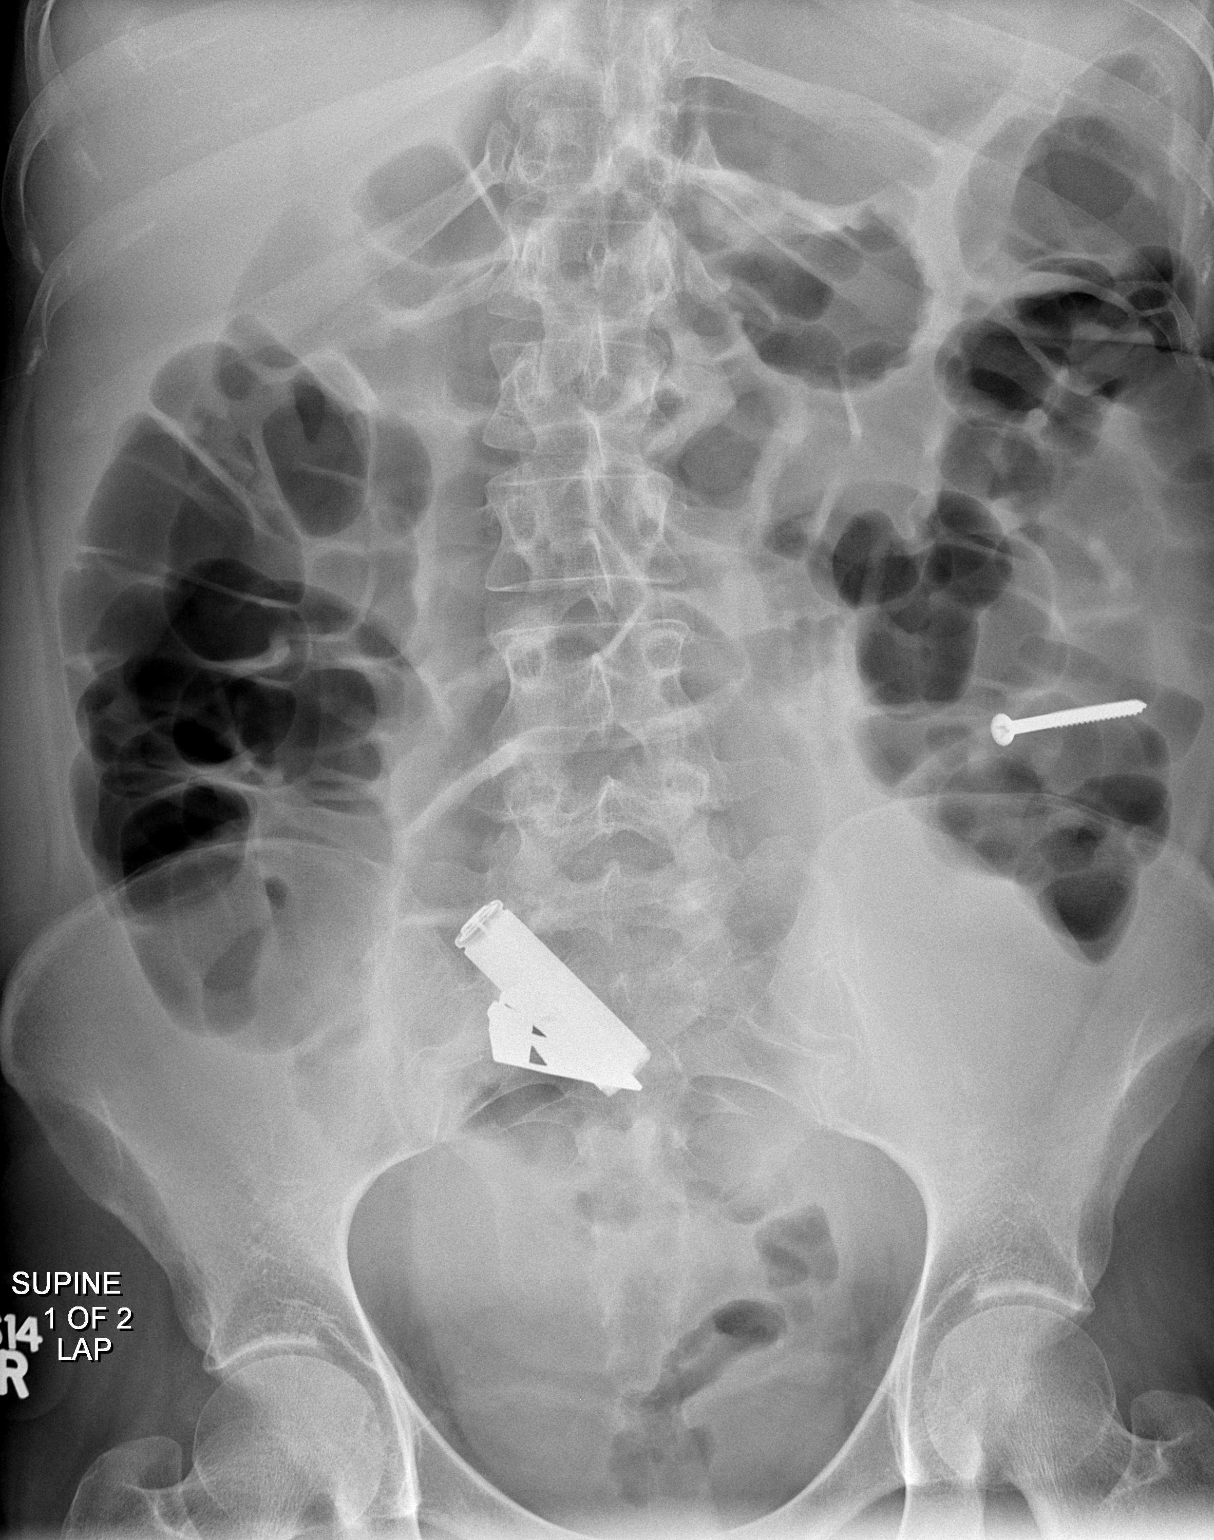
[im 3/3]
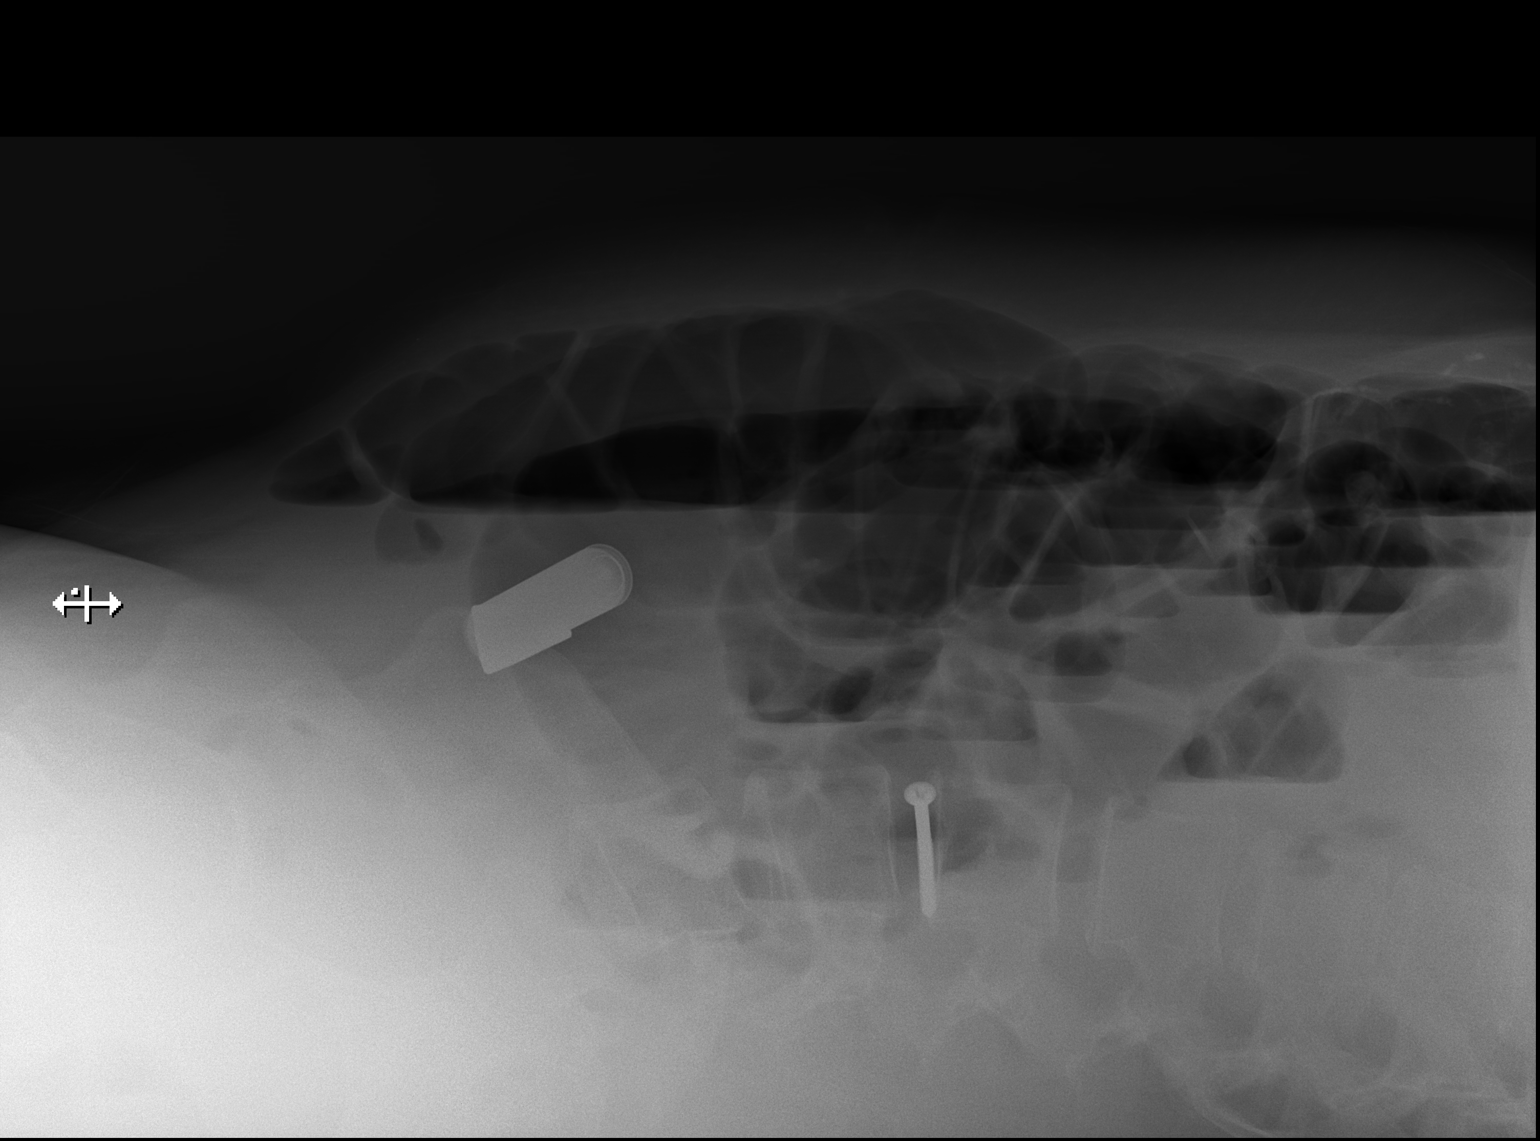

[3 of 3 positions shown; findings below may reference images not displayed]

FINDINGS: A screw is again seen in the left lower quadrant in the area of the
distal descending colon. Two other radiopaque foreign bodies
resembling a battery and a triangular object are seen in the mid
pelvis overlying the sigmoid colon.

Mild diffuse distention of colon is seen with multiple air-fluid
levels. This could be due to a partial colonic obstruction at site
of sigmoid colon foreign bodies versus colonic ileus. There is no
evidence free intraperitoneal air.
IMPRESSION: Three foreign bodies, with screw in the left lower quadrant in the
area the distal descending colon, and 2 other radiopaque foreign
bodies in the mid-pelvis overlying the sigmoid colon.

Partial distal colonic obstruction versus colonic ileus. No evidence
of free air.

## 2015-09-10 SURGERY — COLONOSCOPY WITH PROPOFOL
Anesthesia: General

## 2015-09-10 MED ORDER — AMANTADINE HCL 100 MG PO CAPS: 100.0000 mg | ORAL_CAPSULE | Freq: Two times a day (BID) | ORAL | Status: DC

## 2015-09-10 MED ORDER — PROPOFOL 500 MG/50ML IV EMUL
INTRAVENOUS | Status: DC | PRN
  Administered 2015-09-10: 150 ug/kg/min via INTRAVENOUS

## 2015-09-10 MED ORDER — LIDOCAINE HCL (CARDIAC) 20 MG/ML IV SOLN
INTRAVENOUS | Status: DC | PRN
  Administered 2015-09-10: 100 mg via INTRAVENOUS

## 2015-09-10 MED ORDER — MIDAZOLAM HCL 2 MG/2ML IJ SOLN
INTRAMUSCULAR | Status: DC | PRN
  Administered 2015-09-10: 2 mg via INTRAVENOUS

## 2015-09-10 MED ORDER — PROPOFOL 10 MG/ML IV BOLUS
INTRAVENOUS | Status: DC | PRN
Start: 2015-09-10 — End: 2015-09-10
  Administered 2015-09-10: 30 mg via INTRAVENOUS
  Administered 2015-09-10: 50 mg via INTRAVENOUS

## 2015-09-10 MED ORDER — SODIUM CHLORIDE 0.9 % IV SOLN
INTRAVENOUS | Status: DC
  Administered 2015-09-10: 09:00:00 via INTRAVENOUS

## 2015-09-10 NOTE — Progress Notes (Signed)
D: Pt denies SI/HI/AVH. Pt is pleasant and cooperative, affect is flat and sad, no acute distress noted. Pt appears less anxious and he is interacting with peers and staff appropriately.  A: Pt was offered support and encouragement. Pt was given scheduled medications. Pt was encouraged to attend groups. Q 15 minute checks were done for safety.  R:Pt attends groups and interacts well with peers and staff. Pt is taking medication. Pt has no complaints.Pt receptive to treatment and safety maintained on unit.

## 2015-09-10 NOTE — [facility] Group Notes (Signed)
[place] LCSW Group Therapy  09/10/2015 2:58 PM  Type of Therapy:  Group Therapy  Participation Level:  Did Not Attend  Modes of Intervention:  Discussion, Education, Socialization and Support  Summary of Progress/Problems: Balance in life: Patients will discuss the concept of balance and how it looks and feels to be unbalanced. Pt will identify areas in their life that is unbalanced and ways to become more balanced.    [place] MSW, [place]  09/10/2015, 2:58 PM

## 2015-09-10 NOTE — Plan of Care (Signed)
Problem: Alteration in thought process Goal: LTG-Patient has not harmed self or others in at least 2 days Outcome: Progressing Patient has had no self harm since admission [place] RN

## 2015-09-10 NOTE — H&P (Signed)
Subjective: Patient seen for multiple ingested foreign bodies. Patient denies any nausea or abdominal pain. She tolerated prep for procedure well.  Objective: Vital signs in last 24 hours: Temp:  [97.1 F (36.2 C)-98.8 F (37.1 C)] 97.1 F (36.2 C) (04/27 0845) Pulse Rate:  [64-73] 73 (04/27 0845) Resp:  [16-18] 16 (04/27 0845) BP: (92-117)/(55-73) 117/73 mmHg (04/27 0845) SpO2:  [100 %] 100 % (04/27 0845) Blood pressure 117/73, pulse 73, temperature 97.1 F (36.2 C), temperature source Tympanic, resp. rate 16, height 5\' 6"  (1.676 m), weight 86.183 kg (190 lb), SpO2 100 %.   Intake/Output from previous day: 04/26 0701 - 04/27 0700 In: 1560 [P.O.:1560] Out: -   Intake/Output this shift:     General appearance:  25 female no distress Resp:  Clear to auscultation Cardio:  Regular rate and rhythm GI:  Soft nontender nondistended bowel sounds positive normoactive Extremities:  No clubbing cyanosis or edema   Lab Results: Results for orders placed or performed during the hospital encounter of 08/05/15 (from the past 24 hour(s))  Pregnancy, urine POC     Status: None   Collection Time: 09/10/15  8:43 AM  Result Value Ref Range   Preg Test, Ur NEGATIVE NEGATIVE     No results for input(s): WBC, HGB, HCT, PLT in the last 72 hours. BMET No results for input(s): NA, K, CL, CO2, GLUCOSE, BUN, CREATININE, CALCIUM in the last 72 hours. LFT No results for input(s): PROT, ALBUMIN, AST, ALT, ALKPHOS, BILITOT, BILIDIR, IBILI in the last 72 hours. PT/INR No results for input(s): LABPROT, INR in the last 72 hours. Hepatitis Panel No results for input(s): HEPBSAG, HCVAB, HEPAIGM, HEPBIGM in the last 72 hours. C-Diff No results for input(s): CDIFFTOX in the last 72 hours. No results for input(s): CDIFFPCR in the last 72 hours.   Studies/Results: Ct Abdomen Pelvis Wo Contrast  09/08/2015  CLINICAL DATA:  Ingested foreign objects, prior appendectomy EXAM: CT ABDOMEN AND PELVIS WITHOUT  CONTRAST TECHNIQUE: Multidetector CT imaging of the abdomen and pelvis was performed following the standard protocol without IV contrast. COMPARISON:  CT abdomen pelvis dated 07/30/2015 FINDINGS: Lower chest: 3 mm triangular subpleural lymph node at the right lung base (series 4/image 8), benign. Hepatobiliary: Liver is within normal limits. Gallbladder is underdistended but unremarkable. No intrahepatic or extrahepatic ductal dilatation. Pancreas: Within normal limits. Spleen: Within normal limits. Adrenals/Urinary Tract: Adrenal glands are within normal limits. Kidneys are within normal limits. No renal, ureteral, or bladder calculi. No hydronephrosis. Bladder is within normal limits. Stomach/Bowel: Stomach is within normal limits. No evidence of bowel obstruction. Moderate colonic stool burden, suggesting constipation. Three metallic ingested foreign bodies are within the left colon, and the nature of these items are best visualized on the scout radiograph. Metallic nail is present in the proximal descending colon in the left mid abdomen (series 2/ image 40). Battery is present within the distal descending colon/ proximal sigmoid colon in the left lower abdomen (series 2/ image 58). Additional triangular metallic clip is present within the distal descending colon/ proximal sigmoid colon in the left lower abdomen, degenerating significant streak artifact (series 2/ image 44). Notably, there is no associated bowel wall thickening or inflammatory changes. No free air to suggest perforation. Vascular/Lymphatic: No evidence of abdominal aortic aneurysm. No suspicious abdominopelvic lymphadenopathy. Reproductive: Uterus is within normal limits. Bilateral ovaries are within normal limits. Other: No abdominopelvic ascites. Musculoskeletal: Visualized osseous structures are within normal limits. IMPRESSION: Metallic nail within the proximal descending colon in the left  mid abdomen. Battery within the distal descending  colon/proximal sigmoid colon in the left lower abdomen. Triangular metallic clip within the distal descending colon/ proximal sigmoid colon in the left lower abdomen. No bowel wall thickening, abdominopelvic ascites, or free air to suggest perforation. Electronically Signed   By: Julian Hy M.D.   On: 09/08/2015 09:32    Scheduled Inpatient Medications:   . [MAR Hold] amantadine  100 mg Oral BID  . [MAR Hold] amLODipine  2.5 mg Oral Daily  . [MAR Hold] docusate sodium  200 mg Oral BID  . [MAR Hold] fluPHENAZine  5 mg Oral 3 times per day  . [MAR Hold] lactulose  30 g Oral TID  . [MAR Hold] levothyroxine  75 mcg Oral QAC breakfast  . [MAR Hold] lurasidone  120 mg Oral Q breakfast  . [MAR Hold] nicotine  21 mg Transdermal Daily  . [MAR Hold] Oxcarbazepine  150 mg Oral BID  . [MAR Hold] polyethylene glycol  17 g Oral BID  . [MAR Hold] senna  2 tablet Oral QHS  . [MAR Hold] traZODone  100 mg Oral QHS  . [MAR Hold] venlafaxine XR  150 mg Oral Q breakfast    Continuous Inpatient Infusions:   . sodium chloride      PRN Inpatient Medications:  [MAR Hold] acetaminophen, [MAR Hold] alum & mag hydroxide-simeth, [MAR Hold] magnesium hydroxide  Miscellaneous:   Assessment:  1. Multiple foreign body ingestions. He is occurred about a month ago. They've become relatively static in the left colon. Is concerned that her prolongation awaiting natural passage may be of some increased risk. He is been prepped for colonoscopy today to ascertain whether they are and removal as clinically feasible. I have discussed the risks benefits and complications of procedures to include not limited to bleeding, infection, perforation and the risk of sedation and the patient wishes to proceed..  Plan:  As noted above  [name] MD 09/10/2015, 8:47 AM

## 2015-09-10 NOTE — Progress Notes (Signed)
[facility] MD Progress Note  09/10/2015 2:59 PM Melanie Bell  MRN:  [name]  Subjective:  [name] had a colonoscopy this morning to remove foreign objects from her colon. Unfortunately, Dr. [name] was unable to do so due to a kink in the gut. He spoke with Dr. Burt Knack who agrees to do surgery. The patient is nothing by mouth. Her mother is the guardian lives in [place]. It may pose a problem to obtain her consent.  Melanie Bell denies any symptoms of depression, anxiety, or psychosis. She is slightly unhappy about her diet at the moment but understands that she may go to surgery at any time. She tolerates medications well although she did not receive her medicines today. There are no somatic complaints.  Principal Problem: Schizoaffective disorder, bipolar type ([place]) Diagnosis:   Patient Active Problem List   Diagnosis Date Noted  . Foreign body alimentary tract [T18.9XXA]   . Foreign body ingestion [T18.9XXA] 07/31/2015  . Schizoaffective disorder, bipolar type ([place]) [F25.0] 11/06/2014  . Tobacco use disorder [F17.200] 09/30/2014  . Hypothyroidism [E03.9] 09/29/2014  . Hypertension [I10] 09/29/2014  . Constipation [K59.00] 09/29/2014   Total Time spent with patient: 20 minutes  Past Psychiatric History: Schizoaffective disorder  Past Medical History:  Past Medical History  Diagnosis Date  . Hallucinations 09/30/2014    Sizoaffective  . Depression   . Anxiety   . Hypertension   . Tardive dyskinesia 10/2014    recent onset  . GERD (gastroesophageal reflux disease)   . Hyperlipidemia     Past Surgical History  Procedure Laterality Date  . Wisdom tooth extraction    . Esophagogastroduodenoscopy N/A 11/28/2014    Procedure: ESOPHAGOGASTRODUODENOSCOPY (EGD);  Surgeon: [name], MD;  Location: [facility] ENDOSCOPY;  Service: Endoscopy;  Laterality: N/A;  . Abdominal surgery      "years ago" to remove foreign objects  . Breast lumpectomy Right   . Appendectomy     Family  History:  Family History  Problem Relation Age of Onset  . Depression Mother   . Hypertension Mother    Family Psychiatric  History: Depression. Social History:  History  Alcohol Use No     History  Drug Use No    Social History   Social History  . Marital Status: Single    Spouse Name: N/A  . Number of Children: N/A  . Years of Education: N/A   Social History Main Topics  . Smoking status: Current Every Day Smoker -- 0.50 packs/day for 3 years    Types: Cigarettes  . Smokeless tobacco: Never Used  . Alcohol Use: No  . Drug Use: No  . Sexual Activity: No   Other Topics Concern  . None   Social History Narrative   Additional Social History:    Pain Medications: See PTA Prescriptions: See PTA Over the Counter: See PTA History of alcohol / drug use?: No history of alcohol / drug abuse Longest period of sobriety (when/how long): No history of use                    Sleep: Fair  Appetite:  Fair  Current Medications: Current Facility-Administered Medications  Medication Dose Route Frequency Provider Last Rate Last Dose  . acetaminophen (TYLENOL) tablet 650 mg  650 mg Oral Q6H PRN [name], MD   650 mg at 09/04/15 1439  . alum & mag hydroxide-simeth (MAALOX/MYLANTA) 200-200-20 MG/5ML suspension 30 mL  30 mL Oral Q4H PRN [name], MD  30 mL at 08/25/15 1951  . amantadine (SYMMETREL) capsule 100 mg  100 mg Oral BID [name], MD   100 mg at 09/09/15 2125  . amLODipine (NORVASC) tablet 2.5 mg  2.5 mg Oral Daily [name], MD   2.5 mg at 09/09/15 0925  . docusate sodium (COLACE) capsule 200 mg  200 mg Oral BID [name], MD   200 mg at 09/09/15 2124  . fluPHENAZine (PROLIXIN) tablet 5 mg  5 mg Oral 3 times per day [name], MD   Stopped at 09/10/15 1400  . lactulose (CHRONULAC) 10 GM/15ML solution 30 g  30 g Oral TID [name], MD   Stopped at 09/10/15 1600  . levothyroxine (SYNTHROID, LEVOTHROID) tablet 75  mcg  75 mcg Oral QAC breakfast [name], MD   75 mcg at 09/10/15 0651  . lurasidone (LATUDA) tablet 120 mg  120 mg Oral Q breakfast [name], MD   Stopped at 09/10/15 0800  . magnesium hydroxide (MILK OF MAGNESIA) suspension 30 mL  30 mL Oral Daily PRN [name], MD   30 mL at 08/24/15 1450  . nicotine (NICODERM CQ - dosed in mg/24 hours) patch 21 mg  21 mg Transdermal Daily [name], MD   21 mg at 09/07/15 0922  . Oxcarbazepine (TRILEPTAL) tablet 150 mg  150 mg Oral BID [name], MD   150 mg at 09/09/15 2125  . polyethylene glycol (MIRALAX / GLYCOLAX) packet 17 g  17 g Oral BID [name], MD   17 g at 09/09/15 [id number]  . senna (SENOKOT) tablet 17.2 mg  2 tablet Oral QHS [name], MD   17.2 mg at 09/09/15 2128  . traZODone (DESYREL) tablet 100 mg  100 mg Oral QHS [name], MD   100 mg at 09/09/15 2128  . venlafaxine XR (EFFEXOR-XR) 24 hr capsule 150 mg  150 mg Oral Q breakfast [name], MD   Stopped at 09/10/15 0800    Lab Results:  Results for orders placed or performed during the hospital encounter of 08/05/15 (from the past 48 hour(s))  Pregnancy, urine POC     Status: None   Collection Time: 09/10/15  8:43 AM  Result Value Ref Range   Preg Test, Ur NEGATIVE NEGATIVE    Comment:        THE SENSITIVITY OF THIS METHODOLOGY IS >24 mIU/mL     Blood Alcohol level:  Lab Results  Component Value Date   ETH <5 07/30/2015   ETH <5 07/13/2015    Physical Findings: AIMS: Facial and Oral Movements Muscles of Facial Expression: None, normal Lips and Perioral Area: None, normal Jaw: None, normal Tongue: None, normal,Extremity Movements Upper (arms, wrists, hands, fingers): None, normal Lower (legs, knees, ankles, toes): None, normal, Trunk Movements Neck, shoulders, hips: None, normal, Overall Severity Severity of abnormal movements (highest score from questions above): None, normal Incapacitation due to abnormal movements: None,  normal Patient's awareness of abnormal movements (rate only patient's report): Aware, no distress, Dental Status Current problems with teeth and/or dentures?: No Does patient usually wear dentures?: No  CIWA:    COWS:     Musculoskeletal: Strength & Muscle Tone: within normal limits Gait & Station: normal Patient leans: N/A  Psychiatric Specialty Exam: Review of Systems  All other systems reviewed and are negative.   Blood pressure 109/72, pulse 51, temperature 99.1 F (37.3 C), temperature source Tympanic, resp. rate  10, height 5\' 6"  (1.676 m), weight 86.183 kg (190 lb), SpO2 100 %.Body mass index is 30.68 kg/(m^2).  General Appearance: Casual  Eye Contact::  Good  Speech:  Clear and Coherent  Volume:  Normal  Mood:  Anxious  Affect:  Appropriate  Thought Process:  Goal Directed  Orientation:  Full (Time, Place, and Person)  Thought Content:  WDL  Suicidal Thoughts:  No  Homicidal Thoughts:  No  Memory:  Immediate;   Fair Recent;   Fair Remote;   Fair  Judgement:  Fair  Insight:  Lacking  Psychomotor Activity:  Normal  Concentration:  Fair  Recall:  [employer] of Knowledge:Fair  Language: Fair  Akathisia:  No  Handed:  Right  AIMS (if indicated):     Assets:  Communication Skills Desire for Improvement Financial Resources/Insurance Housing Resilience Social Support  ADL's:  Intact  Cognition: WNL  Sleep:  Number of Hours: 7.75   Treatment Plan Summary: Daily contact with patient to assess and evaluate symptoms and progress in treatment and Medication management   [name] is a 25 year old female with a history of schizoaffective disorder admitted for worsening of depression and suicide attempt by swallowing an AA battery, an earing and a screw.   1. Suicidal ideation. This has resolved. The patient is able to contract for safety. She is forward thinking and optimistic about the future.    2. Mood and psychosis. We continued Prolixin and Latuda for  psychosis, Trileptal for mood stabilization, and Effexor for depression. Cogentin was substituted with Symmetrel. Patient is stable no changes were made to her medication regimen   3. Hypertension. She is on amlodipine.   4. Constipation. She is on bowel regimen.   5. Hypothyroidism. She is on Synthroid.  6. Smoking. Nicotine patch is available.  7. Insomnia. She responded well to trazodone.  8. Social. She is an incompetent adult. Her Kara Dies is the guardian. She lives in [place].  9. Metabolic syndrome. Lipid panel, hemoglobin A1c, TSH, and PRL were checked during previous admission and were normal. Prolactin 15.   10. Foreign body ingestion. There were three foreign objects present on X-ray. The AA battery was injested on 3/15. Colonoscopy to remove foreign bodies on 4/27 by Dr. [name] was unsuccessful. The patient is awaiting surgery consultation by Dr. Burt Knack. She will likely be transferred to his service.   Orson Slick, MD 09/10/2015, 2:59 PM

## 2015-09-10 NOTE — Plan of Care (Signed)
Problem: Alteration in thought process Goal: LTG-Patient behavior demonstrates decreased signs psychosis (Patient behavior demonstrates decreased signs of psychosis to the point the patient is safe to return home and continue treatment in an outpatient setting.)  Outcome: Progressing Patient has no alteration in thought process [place]ownsend RN

## 2015-09-10 NOTE — Anesthesia Postprocedure Evaluation (Signed)
Anesthesia Post Note  Patient: Melanie Bell  Procedure(s) Performed: Procedure(s) (LRB): COLONOSCOPY WITH PROPOFOL (N/A)  Patient location during evaluation: Endoscopy Anesthesia Type: General Level of consciousness: awake and alert Pain management: pain level controlled Vital Signs Assessment: post-procedure vital signs reviewed and stable Respiratory status: spontaneous breathing, nonlabored ventilation, respiratory function stable and patient connected to nasal cannula oxygen Cardiovascular status: blood pressure returned to baseline and stable Postop Assessment: no signs of nausea or vomiting Anesthetic complications: no    Last Vitals:  Filed Vitals:   09/10/15 1030 09/10/15 1040  BP: 105/77 109/72  Pulse: 54 51  Temp:    Resp: 17 10    Last Pain:  Filed Vitals:   09/10/15 1043  PainSc: 0-No pain                 Martha Clan

## 2015-09-10 NOTE — Transfer of Care (Signed)
Immediate Anesthesia Transfer of Care Note  Patient: Melanie Bell  Procedure(s) Performed: Procedure(s): COLONOSCOPY WITH PROPOFOL (N/A)  Patient Location: PACU  Anesthesia Type:General  Level of Consciousness: awake  Airway & Oxygen Therapy: Patient Spontanous Breathing and Patient connected to nasal cannula oxygen  Post-op Assessment: Post -op Vital signs reviewed and stable  Post vital signs: stable  Last Vitals:  Filed Vitals:   09/10/15 1009 09/10/15 1010  BP: 107/58 107/58  Pulse: 60 52  Temp: 37.3 C   Resp: 16 18    Last Pain:  Filed Vitals:   09/10/15 1010  PainSc: 0-No pain      Patients Stated Pain Goal: 0 ([id number])  Complications: No apparent anesthesia complications

## 2015-09-10 NOTE — Op Note (Signed)
[facility] Gastroenterology Patient [name] Procedure Date: 09/10/2015 9:13 AM MRN: [name] Account #: [id number] Date of Birth: Jan 17, 1991 Admit Type: Inpatient Age: 25 [facility] ENDO ROOM 3 Gender: Female Note Status: Finalized Procedure:            Colonoscopy Indications:          Abnormal abdominal x-ray of the GI tract, Abnormal CT                        of the GI tract, Reported multiple colonic foriegn body. Providers:            [name], MD Referring MD:         Casilda Carls, MD (Referring MD) Medicines:            Monitored Anesthesia Care Complications:        No immediate complications. Procedure:            Pre-Anesthesia Assessment:                       - ASA Grade Assessment: II - A patient with mild                        systemic disease.                       After obtaining informed consent, the colonoscope was                        passed under direct vision. Throughout the procedure,                        the patient's blood pressure, pulse, and oxygen                        saturations were monitored continuously. The                        Colonoscope was introduced through the anus with the                        intention of advancing to the cecum. The scope was                        advanced to the splenic flexure before the procedure                        was aborted. Medications were given. The colonoscopy                        was performed with moderate difficulty due to a                        tortuous colon. The patient tolerated the procedure                        well. Findings:      The sigmoid colon and descending colon were significantly redundant.      the scope was advanced without evidence of foriegn body to about 75-80       cm marking. There is encountered at  this point a sharp angulation of the       lumen that I was unable to move further despite position changes and       careful  reduction of the colon.      The digital rectal exam was normal. Impression:           - Redundant colon. Foriegn bodies not located in the                        region expected per CT. My impresion is possible                        adhesion or stenosis preventing normal passage.                       - No specimens collected. Recommendation:       - Clear liquid diet.                       - The findings and recommendations were discussed with                        the surgeon. Procedure Code(s):    --- Professional ---                       [phone], 32, Colonoscopy, flexible; diagnostic, including                        collection of specimen(s) by brushing or washing, when                        performed (separate procedure) Diagnosis Code(s):    --- Professional ---                       R93.3, Abnormal findings on diagnostic imaging of other                        parts of digestive tract                       Q43.8, Other specified congenital malformations of                        intestine CPT copyright 2016 [employer] Medical [employer]. All rights reserved. The codes documented in this report are preliminary and upon coder review may  be revised to meet current compliance requirements. [name], MD 09/10/2015 10:17:20 AM This report has been signed electronically. Number of Addenda: 0 Note Initiated On: 09/10/2015 9:13 AM Total Procedure Duration: 0 hours 43 minutes 1 second       [facility]

## 2015-09-10 NOTE — Tx Team (Signed)
Interdisciplinary Treatment Plan Update (Adult)  Date:  09/10/2015 Time Reviewed:  3:58 PM  Progress in Treatment: Attending groups: Yes. Participating in groups:  Yes. Taking medication as prescribed:  Yes. Tolerating medication:  Yes. Family/Significant othe contact made:  Yes, individual(s) contacted:  mother and group home Patient understands diagnosis:  Yes. Discussing patient identified problems/goals with staff:  Yes. Medical problems stabilized or resolved:  No. and As evidenced by:  Pt has not passed the battery yet.  Denies suicidal/homicidal ideation: Yes. Issues/concerns per patient self-inventory:  Yes. Other:  New problem(s) identified: No, Describe:  NA  Discharge Plan or Barriers: Pt plans to return to group home and follow up with outpatient.    Reason for Continuation of Hospitalization: Hallucinations Medication stabilization Other; describe Pt swallowed objects  Comments: Melanie Bell is a 25 year old female with a history of schizoaffective disorder. She was briefly admitted to medical floor after she swallowed 3 foreign objects including a screw, AA battery and an earring. She was transferred to psychiatry before she passed them. The battery has been there since March 15. Apparently about that he should not stick around the gap for longer than a week according to GI note. She is now being followed by Dr. [name], GI. Surgery refuses to operate until there is perforation and/or peritonitis. The patient is no longer in need of psychiatric care but I could not discharge her to home given her medical condition. She is on liquid diet and laxatives.  Melanie Bell denies any symptoms of depression, anxiety, or psychosis. She denies any abdominal pain. In spite over multiple laxatives and lactulose she has not had a bowel movement yesterday. We will give mag citrate again today as her x-ray indicates massive amounts of stool in the bowels. She accepts medications and  tolerates them well. She participates well in programming.   Estimated length of stay: up to 7 days  New goal(s): NA  Review of initial/current patient goals per problem list:   1.  Goal(s): Patient will participate in aftercare plan * Met: Yes * Target date: at discharge * As evidenced by: Patient will participate within aftercare plan AEB aftercare provider and housing plan at discharge being identified. 08/18/15: Patient has a provider and will be referred to [facility] and has a group home which she can return to at discharge. Goal met.    2.  Goal (s): Patient will exhibit decreased depressive symptoms and suicidal ideations. * Met: Yes *  Target date: at discharge * As evidenced by: Patient will utilize self rating of depression at 3 or below and demonstrate decreased signs of depression or be deemed stable for discharge by MD. 08/18/15: Patient reports no SI and depression of 0. Goal met.   3.  Goal (s): Patient will demonstrate decreased symptoms of psychosis. * Met: No  *  Target date: at discharge * As evidenced by: Patient will not endorse signs of psychosis or be deemed stable for discharge by MD.  08/18/15: Patient reports no psychosis but is awaiting a batter to pass through her intestines before discharge.  08/20/15: Patient is still awaiting battery which she swallowed to pass and will discharge once this happens. MD is stating this could happen in the next couple of days.  08/25/15: Patient is continued to be monitored for movement of battery she swallowed and cannot be discharged at this time as this is a risk and remains in BMU as medical floor will not accept patient.  08/27/15: Patient is still waiting  for batter to discharge but no luck yet.  09/01/15: Patient has battery in her intestines that she swallowed and will discharge once she passes this per MD. 09/03/15: Patient is awaiting for a battery which she swallowed to discharge.  09/10/15: Patient went to Endoscopy today but was  not able to remove battery. Patient will remain in the hospital for further evaluation or passes the battery.   Attendees: Physician:   Orson Slick, MD 4/27/20173:58 PM  Nursing:   Nicanor Bake, RN  4/27/20173:58 PM  Other:  Keene Breath, [place] 4/27/20173:58 PM  Other:  4/27/20173:58 PM  Other:  4/27/20173:58 PM  Other:  4/27/20173:58 PM  Other:  4/27/20173:58 PM  Other:   4/27/20173:58 PM   Scribe for Treatment Team:   Keene Breath, MSW, LCSW  09/10/2015, 3:58 PM

## 2015-09-10 NOTE — Progress Notes (Signed)
Recreation Therapy Notes  Date: 04.27.17 Time: 1:00 pm Location: Craft Room  Group Topic: Leisure Education  Goal Area(s) Addresses:  Patient will identify things they are grateful for. Patient will identify how being grateful can influence decision making.  Behavioral Response: Attentive, Interactive  Intervention: Grateful Wheel  Activity: Patients were given a I Am Grateful For worksheet and instructed to write at least one thing they are grateful for under each category.  Education:LRT educated patients on why it is important to be aware of things we are grateful for.  Education Outcome: Acknowledges education/In group clarification offered  Clinical Observations/Feedback: Patient completed activity by writing at least two items for all of the categories. Patient contributed to group discussion by stating some things she is grateful for, how she can put leisure back in her schedule, and what emotions she feels when she participates in leisure.  Leonette Monarch, LRT/CTRS 09/10/2015 2:32 PM

## 2015-09-10 NOTE — [facility] Suicide Risk Assessment (Addendum)
[facility] Discharge Suicide Risk Assessment   Principal Problem: Schizoaffective disorder, bipolar type [facility]) Discharge Diagnoses:  Patient Active Problem List   Diagnosis Date Noted  . Foreign body alimentary tract [T18.9XXA]   . Foreign body ingestion [T18.9XXA] 07/31/2015  . Schizoaffective disorder, bipolar type ([place]) [F25.0] 11/06/2014  . Tobacco use disorder [F17.200] 09/30/2014  . Hypothyroidism [E03.9] 09/29/2014  . Hypertension [I10] 09/29/2014  . Constipation [K59.00] 09/29/2014    Total Time spent with patient: 30 minutes  Musculoskeletal: Strength & Muscle Tone: within normal limits Gait & Station: normal Patient leans: N/A  Psychiatric Specialty Exam: Review of Systems  All other systems reviewed and are negative.   Blood pressure 97/72, pulse 73, temperature 98.2 F (36.8 C), temperature source Oral, resp. rate 18, height 5\' 6"  (1.676 m), weight 86.183 kg (190 lb), SpO2 100 %.Body mass index is 30.68 kg/(m^2).  General Appearance: Casual  Eye Contact::  Good  Speech:  Clear and [id number]  Volume:  Normal  Mood:  Euthymic  Affect:  Appropriate  Thought Process:  Goal Directed  Orientation:  Full (Time, Place, and Person)  Thought Content:  WDL  Suicidal Thoughts:  No  Homicidal Thoughts:  No  Memory:  Immediate;   Fair Recent;   Fair Remote;   Fair  Judgement:  Impaired  Insight:  Lacking  Psychomotor Activity:  Normal  Concentration:  Fair  Recall:  [place]  Language: Fair  Akathisia:  No  Handed:  Right  AIMS (if indicated):     Assets:  Communication Skills Desire for Improvement Financial Resources/Insurance Housing Resilience Social Support  Sleep:  Number of Hours: 7.75  Cognition: WNL  ADL's:  Intact   Mental Status Per Nursing Assessment::   On Admission:     Demographic Factors:  Adolescent or young adult and Caucasian  Loss Factors: Decline in physical health  Historical Factors: Prior suicide attempts,  Family history of mental illness or substance abuse and Impulsivity  Risk Reduction Factors:   Sense of responsibility to family, Living with another person, especially a relative, Positive social support and Positive therapeutic relationship  Continued Clinical Symptoms:  Schizophrenia:   Less than 64 years old  Cognitive Features That Contribute To Risk:  None    Suicide Risk:  Minimal: No identifiable suicidal ideation.  Patients presenting with no risk factors but with morbid ruminations; may be classified as minimal risk based on the severity of the depressive symptoms  Follow-up Information    Follow up with [facility] Go on 08/13/2015.   Why:  1:20 pm, for , Hospital Follow up, Outpatient Medication Management, Please reschedule if unable to make appointment.   Contact information:   [place] [phone] FAX      Follow up with [facility].   Why:  Patient is referred to [facility] services.   Contact information:   [address] [place], [place] Fax [phone]       Plan Of Care/Follow-up recommendations:  Activity:  as tolerated. Diet:  low sodium heart healthy. Other:  keep follow up appointments.  Orson Slick, MD 09/10/2015, 7:02 AM

## 2015-09-10 NOTE — Consult Note (Signed)
Surgical Consultation  09/10/2015  Melanie Bell is an 25 y.o. female.   [id number] FB  HPI: This patient well-known to the surgical services who I have personally seen in the past for ingested foreign bodies. Is been in the hospital for several weeks with failing to pass ingested foreign bodies are of a metallic nature namely a screw fastener a battery and what we believed to be a triangular shaped earring He is asymptomatic from this ingestion but has not been able to go home because of these findings. She has had prior surgeries to remove ingested foreign bodies.  Dr. Anastasio Champion did a colonoscopy this morning and tried to pass the scope past the left colon was unable to identify the foreign bodies possibly due to redundancy or kinking of the left colon.  Scrotal skin I discussed the patient's findings and the fact that she has undergone a bowel prep and not been able to be discharged I thought that it may be prudent to proceed with surgery at this point.  Past Medical History  Diagnosis Date  . Hallucinations 09/30/2014    Sizoaffective  . Depression   . Anxiety   . Hypertension   . Tardive dyskinesia 10/2014    recent onset  . GERD (gastroesophageal reflux disease)   . Hyperlipidemia     Past Surgical History  Procedure Laterality Date  . Wisdom tooth extraction    . Esophagogastroduodenoscopy N/A 11/28/2014    Procedure: ESOPHAGOGASTRODUODENOSCOPY (EGD);  Surgeon: [name], MD;  Location: [facility] ENDOSCOPY;  Service: Endoscopy;  Laterality: N/A;  . Abdominal surgery      "years ago" to remove foreign objects  . Breast lumpectomy Right   . Appendectomy      Family History  Problem Relation Age of Onset  . Depression Mother   . Hypertension Mother     Social History:  reports that she has been smoking Cigarettes.  She has a 1.5 pack-year smoking history. She has never used smokeless tobacco. She reports that she does not drink alcohol or use illicit  drugs.  Allergies:  Allergies  Allergen Reactions  . Betadine [Povidone Iodine] Other (See Comments)    Reaction:  Unknown   . Shellfish-Derived Products Other (See Comments)    Reaction:  Unknown   . Iodine Rash    Medications reviewed.   Review of Systems:   Review of Systems  Constitutional: Negative.   HENT: Negative.   Eyes: Negative.   Respiratory: Negative.   Cardiovascular: Negative.   Gastrointestinal: Negative.   Genitourinary: Negative.   Musculoskeletal: Negative.   Skin: Negative.   Neurological: Negative.      Physical Exam:  BP 109/72 mmHg  Pulse 51  Temp(Src) 99.1 F (37.3 C) (Tympanic)  Resp 10  Ht 5\' 6"  (1.676 m)  Wt 190 lb (86.183 kg)  BMI 30.68 kg/m2  SpO2 100%  Physical Exam  Constitutional: She is oriented to person, place, and time and well-developed, well-nourished, and in no distress. No distress.  HENT:  Head: Normocephalic and atraumatic.  Eyes: Pupils are equal, round, and reactive to light. Right eye exhibits no discharge. Left eye exhibits no discharge. No scleral icterus.  Neck: Normal range of motion.  Cardiovascular: Normal rate, regular rhythm and normal heart sounds.   Pulmonary/Chest: Effort normal and breath sounds normal. No respiratory distress. She has no wheezes. She has no rales.  Abdominal: Soft. She exhibits no distension. There is no tenderness. There is no rebound and no guarding.  Scars  Musculoskeletal: Normal range of motion. She exhibits no edema.  Lymphadenopathy:    She has no cervical adenopathy.  Neurological: She is alert and oriented to person, place, and time.  Skin: Skin is warm and dry. She is not diaphoretic.  Vitals reviewed.     Results for orders placed or performed during the hospital encounter of 08/05/15 (from the past 48 hour(s))  Pregnancy, urine POC     Status: None   Collection Time: 09/10/15  8:43 AM  Result Value Ref Range   Preg Test, Ur NEGATIVE NEGATIVE    Comment:         THE SENSITIVITY OF THIS METHODOLOGY IS >24 mIU/mL    Dg Abd 2 Views  09/10/2015  CLINICAL DATA:  Followup GI foreign bodies. EXAM: ABDOMEN - 2 VIEW COMPARISON:  CT on 09/08/2015 FINDINGS: A screw is again seen in the left lower quadrant in the area of the distal descending colon. Two other radiopaque foreign bodies resembling a battery and a triangular object are seen in the mid pelvis overlying the sigmoid colon. Mild diffuse distention of colon is seen with multiple air-fluid levels. This could be due to a partial colonic obstruction at site of sigmoid colon foreign bodies versus colonic ileus. There is no evidence free intraperitoneal air. IMPRESSION: Three foreign bodies, with screw in the left lower quadrant in the area the distal descending colon, and 2 other radiopaque foreign bodies in the mid-pelvis overlying the sigmoid colon. Partial distal colonic obstruction versus colonic ileus. No evidence of free air. Electronically Signed   By: Earle Gell M.D.   On: 09/10/2015 08:45    Assessment/Plan:  KUBs reviewed these demonstrate foreign bodies probably in the left colon although Dr. Anastasio Champion was unable to identify them on colonoscopy. This may be a relation to the kinking or angulation of the colon and rectum. Regardless patient has had a bowel prep and has been in the hospital for several weeks unable to go home due to the presence of these foreign bodies. With that in mind I've offered surgical intervention. My plan would be to start with a rigid sigmoidoscopy and identify and retrieve any or all foreign bodies of possible otherwise an exploratory laparotomy with possible bowel resection would be indicated I have asked for a sterile magnet to be present in the operating room as well for any of these which may be ferrous in nature. Lab will be to remove foreign bodies via laparotomy if not identifiable on rigid sigmoidoscopy which is doubtful. Options of observation reviewed the risk of bleeding  infection recurrence inability to retrieve all 3 foreign bodies and the potential for a colostomy or ileostomy were reviewed with the patient she understood and agreed to proceed. It is my understanding that consent was obtained from her mother her guardian in [place] and this was discussed with the nursing staff as well  Florene Glen, MD, FACS

## 2015-09-10 NOTE — Plan of Care (Signed)
Problem: Alteration in thought process Goal: LTG-Patient behavior demonstrates decreased signs psychosis (Patient behavior demonstrates decreased signs of psychosis to the point the patient is safe to return home and continue treatment in an outpatient setting.)  Outcome: Progressing Patient demonstrates decreased psychosis.

## 2015-09-10 NOTE — Anesthesia Preprocedure Evaluation (Signed)
Anesthesia Evaluation  Patient identified by MRN, date of birth, ID band Patient awake    Reviewed: Allergy & Precautions, H&P , NPO status , Patient's Chart, lab work & pertinent test results, reviewed documented beta blocker date and time   History of Anesthesia Complications Negative for: history of anesthetic complications  Airway Mallampati: I  TM Distance: >3 FB Neck ROM: full    Dental no notable dental hx. (+) Missing, Teeth Intact   Pulmonary neg shortness of breath, asthma (exercise induced) , neg sleep apnea, neg COPD, neg recent URI, Current Smoker,    Pulmonary exam normal breath sounds clear to auscultation       Cardiovascular Exercise Tolerance: Good hypertension, (-) angina(-) CAD, (-) Past MI, (-) Cardiac Stents and (-) CABG Normal cardiovascular exam(-) dysrhythmias (-) Valvular Problems/Murmurs Rhythm:regular Rate:Normal     Neuro/Psych PSYCHIATRIC DISORDERS (Depression and schizoaffective disorder) negative neurological ROS     GI/Hepatic Neg liver ROS, GERD  ,  Endo/Other  neg diabetesHypothyroidism   Renal/GU negative Renal ROS  negative genitourinary   Musculoskeletal   Abdominal   Peds  Hematology negative hematology ROS (+)   Anesthesia Other Findings Past Medical History:   Hallucinations                                  09/30/2014      Comment:Sizoaffective   Depression                                                   Anxiety                                                      Hypertension                                                 Tardive dyskinesia                              10/2014         Comment:recent onset   GERD (gastroesophageal reflux disease)                       Hyperlipidemia                                               Reproductive/Obstetrics negative OB ROS                             Anesthesia Physical Anesthesia Plan  ASA:  III  Anesthesia Plan: General   Post-op Pain Management:    Induction:   Airway Management Planned:   Additional Equipment:   Intra-op Plan:   Post-operative Plan:   Informed Consent: I have reviewed the patients History and  Physical, chart, labs and discussed the procedure including the risks, benefits and alternatives for the proposed anesthesia with the patient or authorized representative who has indicated his/her understanding and acceptance.   Dental Advisory Given  Plan Discussed with: Anesthesiologist, CRNA and Surgeon  Anesthesia Plan Comments:         Anesthesia Quick Evaluation

## 2015-09-10 NOTE — Discharge Summary (Addendum)
Physician Discharge Summary Note  Patient:  Melanie Bell is an 25 y.o., female MRN:  [id number] DOB:  January 29, 1991 Patient phone:  [phone] (home)  Patient address:   [place] [address],  Total Time spent with patient: 30 minutes  Date of Admission:  08/05/2015 Date of Discharge: 08/11/2015  Reason for Admission:  Suicide attempt.  Identifying data. Melanie Bell is a 25 year old female with history of schizoaffective disorder.  Chief complaint. "I swallowed a battery."  History of present illness. Information was obtained from the patient and the chart. Melanie Bell has a long history of depression, psychosis and mood instability. She is well known to [name] and has been hospitalized at [facility] on multiple occasions. She has been relatively stable on a combination of Prolixin, Latuda, Effexor and Trileptal. She has a history of swallowing foreign objects but in the past several months she was able to come to the hospital to ask for help before she swallowed. She reports that on March 15 there was an anniversary of death of her favorite friend who committed suicide. She has been thinking about it a lot. She has been rather anxious and sad about it. She started swallowing again initially it was a triangular metal object. She came to the emergency room but was sent home she then proceeded to swallow and AA bat ery followed by a screw. She was brought to the emergency room and admitted to medical floor. Abdominal x-ray indicates the presence of 3 foreign objects as about that like the show anterograde progression and are now seen in the distal colon. The patient has a history of constipation and sometimes it is difficult for her to pass for objects that she swallowed. She came to the hospital for help. She denies symptoms of psychosis, depression or severe anxiety. She denies symptoms suggestive of bipolar mania. There are no alcohol or drugs involved.   Past  psychiatric history. The patient has been hospitalized numerous times for mood instability, suicidal thinking and swallowing of foreign objects. Recently she seems to develop better coping skills and has been able to come to the hospital before she actually swallows unfavorable objects. She has been tried on multiple medications. Seems that the current regimen keeps her stable.   Family psychiatric history. Mother with depression.   Social history. She is an incompetent adult. She lives in a group home. Her home life was disrupted when her mother divorced her stepfather. She lost contact with her step grandmother who was very important to her. Her mother who is her guardian was diagnosed with with MS. She lives in [place] now.   Principal Problem: Schizoaffective disorder, bipolar type [facility]) Discharge Diagnoses: Patient Active Problem List   Diagnosis Date Noted  . Foreign body alimentary tract [T18.9XXA]   . Foreign body ingestion [T18.9XXA] 07/31/2015  . Schizoaffective disorder, bipolar type ([place]) [F25.0] 11/06/2014  . Tobacco use disorder [F17.200] 09/30/2014  . Hypothyroidism [E03.9] 09/29/2014  . Hypertension [I10] 09/29/2014  . Constipation [K59.00] 09/29/2014    Past Psychiatric History: schizoaffective disorder.  Past Medical History:  Past Medical History  Diagnosis Date  . Hallucinations 09/30/2014    Sizoaffective  . Depression   . Anxiety   . Hypertension   . Tardive dyskinesia 10/2014    recent onset  . GERD (gastroesophageal reflux disease)   . Hyperlipidemia     Past Surgical History  Procedure Laterality Date  . Wisdom tooth extraction    . Esophagogastroduodenoscopy N/A 11/28/2014  Procedure: ESOPHAGOGASTRODUODENOSCOPY (EGD);  Surgeon: [name], MD;  Location: [facility] ENDOSCOPY;  Service: Endoscopy;  Laterality: N/A;  . Abdominal surgery      "years ago" to remove foreign objects  . Breast lumpectomy     Family History:  Family History  Problem  Relation Age of Onset  . Depression Mother   . Hypertension Mother    Family Psychiatric  History: depression. Social History:  History  Alcohol Use No     History  Drug Use No    Social History   Social History  . Marital Status: Single    Spouse Name: N/A  . Number of Children: N/A  . Years of Education: N/A   Social History Main Topics  . Smoking status: Current Every Day Smoker -- 0.50 packs/day for 3 years    Types: Cigarettes  . Smokeless tobacco: Never Used  . Alcohol Use: No  . Drug Use: No  . Sexual Activity: No   Other Topics Concern  . None   Social History Narrative    Hospital Course:    Melanie Bell is a 25 year old female with a history of schizoaffective disorder admitted for worsening of depression and suicide attempt by swallowing an AA battery, an earing and a screw.   1. Suicidal ideation. This has resolved. The patient is able to contract for safety. She is forward thinking and optimistic about the future.    2. Mood and psychosis. We continued Prolixin and Latuda for psychosis, Trileptal for mood stabilization, and Effexor for depression. Cogentin was substituted with Symmetrel. Patient is stable no changes were made to her medication regimen   3. Hypertension. She is on amlodipine.   4. Constipation. She is on bowel regimen.    5. Hypothyroidism. She is on Synthroid.  6. Smoking. Nicotine patch is available.  7. Insomnia. She responded well to trazodone.  8. Social. She is an incompetent adult. Her Kara Dies is the guardian. She lives in [place].  9. Metabolic syndrome. Lipid panel, hemoglobin A1c, TSH, and PRL were checked during previous admission and were normal. Prolactin 15.   10. Foreign body ingestion. There were three foreign objects present on X-ray. The AA battery was injested on 3/15. She underwent colonoscopy this morning but Dr. [name] could not retrieve the battery. The patient will likely be transferred to surgery  service after consultation with Dr. Burt Knack. GI and surgery input is greatly appreciated. She is NPO.  Physical Findings: AIMS: Facial and Oral Movements Muscles of Facial Expression: None, normal Lips and Perioral Area: None, normal Jaw: None, normal Tongue: None, normal,Extremity Movements Upper (arms, wrists, hands, fingers): None, normal Lower (legs, knees, ankles, toes): None, normal, Trunk Movements Neck, shoulders, hips: None, normal, Overall Severity Severity of abnormal movements (highest score from questions above): None, normal Incapacitation due to abnormal movements: None, normal Patient's awareness of abnormal movements (rate only patient's report): Aware, no distress, Dental Status Current problems with teeth and/or dentures?: No Does patient usually wear dentures?: No  CIWA:    COWS:     Musculoskeletal: Strength & Muscle Tone: within normal limits Gait & Station: normal Patient leans: N/A  Psychiatric Specialty Exam: Review of Systems  All other systems reviewed and are negative.   Blood pressure 92/55, pulse 64, temperature 98.8 F (37.1 C), temperature source Oral, resp. rate 18, height 5\' 6"  (1.676 m), weight 86.183 kg (190 lb), SpO2 100 %.Body mass index is 30.68 kg/(m^2).  See SRA.  Sleep:  Number of Hours: 7.75   Have you used any form of tobacco in the last 30 days? (Cigarettes, Smokeless Tobacco, Cigars, and/or Pipes): Yes  Has this patient used any form of tobacco in the last 30 days? (Cigarettes, Smokeless Tobacco, Cigars, and/or Pipes) Yes, Yes, A prescription for an FDA-approved tobacco cessation medication was offered at discharge and the patient refused  Blood Alcohol level:  Lab Results  Component Value Date   [facility] <5 07/30/2015   ETH <5 [id number]    Metabolic Disorder Labs:  Lab Results  Component Value Date   HGBA1C 5.2 06/16/2015   Lab Results  Component Value  Date   PROLACTIN 15.8 06/16/2015   Lab Results  Component Value Date   CHOL 148 06/16/2015   TRIG 79 06/16/2015   HDL 37* 06/16/2015   CHOLHDL 4.0 06/16/2015   VLDL 16 06/16/2015   LDLCALC 95 06/16/2015   LDLCALC 114* 11/06/2014    See Psychiatric Specialty Exam and Suicide Risk Assessment completed by Attending Physician prior to discharge.  Discharge destination:  Home  Is patient on multiple antipsychotic therapies at discharge:  Yes,   Do you recommend tapering to monotherapy for antipsychotics?  No   Has Patient had three or more failed trials of antipsychotic monotherapy by history:  Yes,   Antipsychotic medications that previously failed include:   1.  zyprexa., 2.  seroquel. and 3.  abilify.  Recommended Plan for Multiple Antipsychotic Therapies: Additional reason(s) for multiple antispychotic treatment:  inadequate response to a single agent.  Discharge Instructions    Diet - low sodium heart healthy    Complete by:  As directed      Increase activity slowly    Complete by:  As directed             Medication List    TAKE these medications      Indication   acetaminophen 325 MG tablet  Commonly known as:  TYLENOL  Take 2 tablets (650 mg total) by mouth every 6 (six) hours as needed for mild pain.   Indication:  Fever, Pain     alum & mag hydroxide-simeth 200-200-20 MG/5ML suspension  Commonly known as:  MAALOX/MYLANTA  Take 30 mLs by mouth every 4 (four) hours as needed for indigestion.   Indication:  upset stomsch     amantadine 100 MG capsule  Commonly known as:  SYMMETREL  Take 1 capsule (100 mg total) by mouth 2 (two) times daily.   Indication:  Extrapyramidal Reaction caused by Medications     amLODipine 2.5 MG tablet  Commonly known as:  NORVASC  Take 1 tablet (2.5 mg total) by mouth daily.   Indication:  High Blood Pressure     benztropine 1 MG tablet  Commonly known as:  COGENTIN  Take 1 tablet (1 mg total) by mouth at bedtime.    Indication:  Extrapyramidal Reaction caused by Medications     docusate sodium 100 MG capsule  Commonly known as:  COLACE  Take 2 capsules (200 mg total) by mouth 2 (two) times daily.   Indication:  Constipation     fluPHENAZine 5 MG tablet  Commonly known as:  PROLIXIN  Take 1 tablet (5 mg total) by mouth 3 (three) times daily.   Indication:  Schizophrenia     ibuprofen 800 MG tablet  Commonly known as:  ADVIL,MOTRIN  Take 800 mg by mouth every 8 (eight) hours as needed for moderate pain.  lactulose 10 GM/15ML solution  Commonly known as:  CHRONULAC  Take 45 mLs (30 g total) by mouth 2 (two) times daily.   Indication:  Chronic Constipation     levothyroxine 75 MCG tablet  Commonly known as:  SYNTHROID, LEVOTHROID  Take 1 tablet (75 mcg total) by mouth daily before breakfast.   Indication:  Underactive Thyroid     Lurasidone HCl 120 MG Tabs  Take 1 tablet (120 mg total) by mouth daily with supper.   Indication:  Schizophrenia     OXcarbazepine 150 MG tablet  Commonly known as:  TRILEPTAL  Take 1 tablet (150 mg total) by mouth 2 (two) times daily.   Indication:  Manic-Depression     polyethylene glycol packet  Commonly known as:  MIRALAX / GLYCOLAX  Take 17 g by mouth daily.   Indication:  Constipation     senna 8.6 MG Tabs tablet  Commonly known as:  SENOKOT  Take 2 tablets (17.2 mg total) by mouth at bedtime.   Indication:  Emptying of the Bowel     traZODone 50 MG tablet  Commonly known as:  DESYREL  Take 1 tablet (50 mg total) by mouth at bedtime.   Indication:  Trouble Sleeping     venlafaxine XR 150 MG 24 hr capsule  Commonly known as:  EFFEXOR-XR  Take 1 capsule (150 mg total) by mouth daily with breakfast.   Indication:  Major Depressive Disorder           Follow-up Information    Follow up with [facility]. Go on 08/13/2015.   Why:  1:20 pm, for , Hospital Follow up, Outpatient Medication Management, Please reschedule if unable to  make appointment.   Contact information:   [place] [phone] FAX      Follow up with [facility].   Why:  Patient is referred to [facility] services.   Contact information:   [address] [place], [place] Ph [phone] Fax [phone]       Follow-up recommendations:  Activity:  as tolerated. Diet:  low sodium heart healthy. Other:  keep follow up appointments.  Comments:     Signed: Orson Slick, MD 09/10/2015, 7:33 AM

## 2015-09-10 NOTE — Progress Notes (Signed)
Patient calm and cooperative with NPO status this am, abdominal xray, and colonoscopy for location and possible removal of foreign objects patient has swallowed. Patient denies SI/HI at this time. Transferred for testing with Tech and returned to unit with safety maintained. Patient remains NPO at this time awaiting further orders from [place]. Patient goal today is to have testing and increase therapy group attendance. Safety maintained.

## 2015-09-10 NOTE — Plan of Care (Signed)
Problem: Alteration in thought process Goal: LTG-Patient has not harmed self or others in at least 2 days Outcome: Progressing No SI/SH/HI at this time.

## 2015-09-10 NOTE — Plan of Care (Signed)
Problem: Alteration in thought process Goal: STG-Patient is able to follow short directions Outcome: Progressing Patient able to follow directions with minimal prompting [place] RN

## 2015-09-11 ENCOUNTER — Encounter: Disposition: A | Discharge status: Home / Self Care | Payer: Self-pay | Attending: Psychiatry

## 2015-09-11 ENCOUNTER — Encounter: Payer: Self-pay | Admitting: Gastroenterology

## 2015-09-11 ENCOUNTER — Inpatient Hospital Stay
Admission: RE | Admit: 2015-09-11 | Discharge: 2015-09-16 | DRG: 346 | Disposition: A | Discharge status: Home / Self Care | Payer: Medicaid Other | Source: Ambulatory Visit | Attending: Surgery | Admitting: Surgery

## 2015-09-11 DIAGNOSIS — T184XXA Foreign body in colon, initial encounter: Secondary | ICD-10-CM | POA: Diagnosis present

## 2015-09-11 DIAGNOSIS — K660 Peritoneal adhesions (postprocedural) (postinfection): Secondary | ICD-10-CM | POA: Diagnosis present

## 2015-09-11 DIAGNOSIS — E785 Hyperlipidemia, unspecified: Secondary | ICD-10-CM | POA: Diagnosis present

## 2015-09-11 DIAGNOSIS — Y92129 Unspecified place in nursing home as the place of occurrence of the external cause: Secondary | ICD-10-CM | POA: Diagnosis not present

## 2015-09-11 DIAGNOSIS — I10 Essential (primary) hypertension: Secondary | ICD-10-CM | POA: Diagnosis present

## 2015-09-11 DIAGNOSIS — K219 Gastro-esophageal reflux disease without esophagitis: Secondary | ICD-10-CM | POA: Diagnosis present

## 2015-09-11 DIAGNOSIS — T189XXD Foreign body of alimentary tract, part unspecified, subsequent encounter: Secondary | ICD-10-CM

## 2015-09-11 DIAGNOSIS — F250 Schizoaffective disorder, bipolar type: Secondary | ICD-10-CM | POA: Diagnosis present

## 2015-09-11 DIAGNOSIS — T189XXA Foreign body of alimentary tract, part unspecified, initial encounter: Secondary | ICD-10-CM | POA: Diagnosis present

## 2015-09-11 DIAGNOSIS — X838XXA Intentional self-harm by other specified means, initial encounter: Secondary | ICD-10-CM | POA: Diagnosis not present

## 2015-09-11 LAB — CBC
HEMATOCRIT: 38.2 % (ref 35.0–47.0)
Hemoglobin: 12.9 g/dL (ref 12.0–16.0)
MCH: 30.8 pg (ref 26.0–34.0)
MCHC: 33.9 g/dL (ref 32.0–36.0)
MCV: 91 fL (ref 80.0–100.0)
Platelets: 179 10*3/uL (ref 150–440)
RBC: 4.19 MIL/uL (ref 3.80–5.20)
RDW: 13.6 % (ref 11.5–14.5)
WBC: 7.1 10*3/uL (ref 3.6–11.0)

## 2015-09-11 LAB — SURGICAL PCR SCREEN
MRSA, PCR: NEGATIVE
Staphylococcus aureus: POSITIVE — AB

## 2015-09-11 LAB — POCT PREGNANCY, URINE: PREG TEST UR: NEGATIVE

## 2015-09-11 LAB — CREATININE, SERUM: Creatinine, Ser: 0.67 mg/dL (ref 0.44–1.00)

## 2015-09-11 SURGERY — LAPAROTOMY, EXPLORATORY
Anesthesia: Choice

## 2015-09-11 MED ORDER — CEFOXITIN SODIUM-DEXTROSE 2-2.2 GM-% IV SOLR (PREMIX)
2.0000 g | Freq: Once | INTRAVENOUS | Status: AC
  Administered 2015-09-12: 2 g via INTRAVENOUS
  Filled 2015-09-11: qty 50

## 2015-09-11 MED ORDER — AMANTADINE HCL 100 MG PO CAPS
100.0000 mg | ORAL_CAPSULE | Freq: Two times a day (BID) | ORAL | Status: DC
  Administered 2015-09-11 – 2015-09-16 (×10): 100 mg via ORAL
  Filled 2015-09-11 (×10): qty 1

## 2015-09-11 MED ORDER — LIDOCAINE HCL (PF) 1 % IJ SOLN
INTRAMUSCULAR | Status: AC
  Filled 2015-09-11: qty 30

## 2015-09-11 MED ORDER — VENLAFAXINE HCL ER 75 MG PO CP24
150.0000 mg | ORAL_CAPSULE | Freq: Every day | ORAL | Status: DC
  Administered 2015-09-12 – 2015-09-16 (×5): 150 mg via ORAL
  Filled 2015-09-11 (×2): qty 2
  Filled 2015-09-11: qty 1
  Filled 2015-09-11 (×3): qty 2

## 2015-09-11 MED ORDER — LEVOTHYROXINE SODIUM 75 MCG PO TABS
75.0000 ug | ORAL_TABLET | Freq: Every day | ORAL | Status: DC
  Administered 2015-09-12 – 2015-09-16 (×5): 75 ug via ORAL
  Filled 2015-09-11 (×5): qty 1

## 2015-09-11 MED ORDER — AMLODIPINE BESYLATE 5 MG PO TABS
2.5000 mg | ORAL_TABLET | Freq: Every day | ORAL | Status: DC
  Administered 2015-09-11 – 2015-09-16 (×6): 2.5 mg via ORAL
  Filled 2015-09-11 (×6): qty 1

## 2015-09-11 MED ORDER — DEXTROSE 5 % IV SOLN: 2.0000 g | INTRAVENOUS | Status: DC

## 2015-09-11 MED ORDER — FLUPHENAZINE HCL 5 MG PO TABS
5.0000 mg | ORAL_TABLET | Freq: Three times a day (TID) | ORAL | Status: DC
  Administered 2015-09-11 – 2015-09-16 (×14): 5 mg via ORAL
  Filled 2015-09-11 (×19): qty 1

## 2015-09-11 MED ORDER — ALUM & MAG HYDROXIDE-SIMETH 200-200-20 MG/5ML PO SUSP: 30.0000 mL | ORAL | Status: DC | PRN

## 2015-09-11 MED ORDER — LURASIDONE HCL 40 MG PO TABS
120.0000 mg | ORAL_TABLET | Freq: Every day | ORAL | Status: DC
  Administered 2015-09-11 – 2015-09-15 (×5): 120 mg via ORAL
  Filled 2015-09-11 (×6): qty 3

## 2015-09-11 MED ORDER — BENZTROPINE MESYLATE 1 MG PO TABS
1.0000 mg | ORAL_TABLET | Freq: Every day | ORAL | Status: DC
  Administered 2015-09-11 – 2015-09-15 (×5): 1 mg via ORAL
  Filled 2015-09-11 (×5): qty 1

## 2015-09-11 MED ORDER — LACTATED RINGERS IV SOLN
INTRAVENOUS | Status: DC
  Administered 2015-09-11: 16:00:00 via INTRAVENOUS

## 2015-09-11 MED ORDER — ACETAMINOPHEN 10 MG/ML IV SOLN
INTRAVENOUS | Status: AC
Start: 2015-09-11 — End: 2015-09-11
  Filled 2015-09-11: qty 100

## 2015-09-11 MED ORDER — LACTATED RINGERS IV SOLN
INTRAVENOUS | Status: DC
Start: 2015-09-11 — End: 2015-09-12
  Administered 2015-09-11 – 2015-09-12 (×3): via INTRAVENOUS

## 2015-09-11 MED ORDER — OXCARBAZEPINE 150 MG PO TABS
150.0000 mg | ORAL_TABLET | Freq: Two times a day (BID) | ORAL | Status: DC
  Administered 2015-09-11 – 2015-09-16 (×10): 150 mg via ORAL
  Filled 2015-09-11 (×11): qty 1

## 2015-09-11 MED ORDER — BUPIVACAINE HCL (PF) 0.5 % IJ SOLN
INTRAMUSCULAR | Status: AC
  Filled 2015-09-11: qty 30

## 2015-09-11 MED ORDER — TRAZODONE HCL 50 MG PO TABS
50.0000 mg | ORAL_TABLET | Freq: Every day | ORAL | Status: DC
  Administered 2015-09-11 – 2015-09-15 (×5): 50 mg via ORAL
  Filled 2015-09-11 (×5): qty 1

## 2015-09-11 MED ORDER — DEXTROSE 5 % IV SOLN
2.0000 g | Freq: Once | INTRAVENOUS | Status: DC
  Filled 2015-09-11: qty 2

## 2015-09-11 MED ORDER — HEPARIN SODIUM (PORCINE) 5000 UNIT/ML IJ SOLN
5000.0000 [IU] | Freq: Three times a day (TID) | INTRAMUSCULAR | Status: DC
  Administered 2015-09-11 – 2015-09-16 (×15): 5000 [IU] via SUBCUTANEOUS
  Filled 2015-09-11 (×15): qty 1

## 2015-09-11 MED ORDER — CEFOXITIN SODIUM-DEXTROSE 2-2.2 GM-% IV SOLR (PREMIX)
INTRAVENOUS | Status: AC
  Filled 2015-09-11: qty 50

## 2015-09-11 SURGICAL SUPPLY — 43 items
BLADE SURG 11 STRL SS SAFETY (MISCELLANEOUS) IMPLANT
BLADE SURG 15 STRL SS SAFETY (BLADE) IMPLANT
BRIEF STRETCH MATERNITY 2XLG (MISCELLANEOUS) IMPLANT
CANISTER SUCT 1200ML W/VALVE (MISCELLANEOUS) IMPLANT
CATH TRAY 16F METER LATEX (MISCELLANEOUS) IMPLANT
CHLORAPREP W/TINT 26ML (MISCELLANEOUS) IMPLANT
DRAPE LAPAROTOMY 100X77 ABD (DRAPES) IMPLANT
DRAPE LAPAROTOMY 77X122 PED (DRAPES) IMPLANT
DRAPE LEGGINS SURG 28X43 STRL (DRAPES) IMPLANT
DRAPE UNDER BUTTOCK W/FLU (DRAPES) IMPLANT
DRSG TELFA 3X8 NADH (GAUZE/BANDAGES/DRESSINGS) IMPLANT
ELECT REM PT RETURN 9FT ADLT (ELECTROSURGICAL)
ELECTRODE REM PT RTRN 9FT ADLT (ELECTROSURGICAL) IMPLANT
GAUZE SPONGE 4X4 12PLY STRL (GAUZE/BANDAGES/DRESSINGS) IMPLANT
GLOVE BIO SURGEON STRL SZ7 (GLOVE) IMPLANT
GLOVE BIO SURGEON STRL SZ8 (GLOVE) IMPLANT
GOWN STRL REUS W/ TWL LRG LVL3 (GOWN DISPOSABLE) IMPLANT
GOWN STRL REUS W/TWL LRG LVL3 (GOWN DISPOSABLE)
KIT RM TURNOVER CYSTO AR (KITS) IMPLANT
KIT RM TURNOVER STRD PROC AR (KITS) IMPLANT
LABEL OR SOLS (LABEL) IMPLANT
NDL SAFETY 22GX1.5 (NEEDLE) IMPLANT
NEEDLE HYPO 25X1 1.5 SAFETY (NEEDLE) IMPLANT
NS IRRIG 1000ML POUR BTL (IV SOLUTION) IMPLANT
PACK BASIN MAJOR ARMC (MISCELLANEOUS) IMPLANT
PACK BASIN MINOR ARMC (MISCELLANEOUS) IMPLANT
PACK COLON CLEAN CLOSURE (MISCELLANEOUS) IMPLANT
PAD OB MATERNITY 4.3X12.25 (PERSONAL CARE ITEMS) IMPLANT
PAD PREP 24X41 OB/GYN DISP (PERSONAL CARE ITEMS) IMPLANT
SEPRAFILM MEMBRANE 5X6 (MISCELLANEOUS) IMPLANT
SOL PREP PVP 2OZ (MISCELLANEOUS)
SOLUTION PREP PVP 2OZ (MISCELLANEOUS) IMPLANT
STAPLER SKIN PROX 35W (STAPLE) IMPLANT
SURGILUBE 2OZ TUBE FLIPTOP (MISCELLANEOUS) IMPLANT
SUT PDS AB 1 TP1 54 (SUTURE) IMPLANT
SUT PROLENE 0 CT 1 30 (SUTURE) IMPLANT
SUT SILK 3-0 (SUTURE) IMPLANT
SUT VIC AB 3-0 SH 27 (SUTURE) ×1
SUT VIC AB 3-0 SH 27X BRD (SUTURE) ×1 IMPLANT
SUT VICRYL 2 0 18  UND BR (SUTURE)
SUT VICRYL 2 0 18 UND BR (SUTURE) IMPLANT
SYR CONTROL 10ML (SYRINGE) IMPLANT
SYRINGE 10CC LL (SYRINGE) IMPLANT

## 2015-09-11 NOTE — Progress Notes (Signed)
Patient discharge teachings and instructions discussed with patient, she verbalized understanding. Patient's belongings given beck to patient and she had no concerns. Patient was escorted via wheelchair to OR by transportation staff for scheduled surgery at 1508. Patient thankful for care received and stated that she was not going ever swallow anything again.

## 2015-09-11 NOTE — Plan of Care (Signed)
Problem: Alteration in thought process Goal: STG-Patient is able to follow short directions Outcome: Progressing Patient cooperative with routine and remained NPO for scheduled surgery without any problem.

## 2015-09-11 NOTE — Progress Notes (Signed)

## 2015-09-11 NOTE — NC FL2 (Signed)
[place] LEVEL OF CARE SCREENING TOOL     IDENTIFICATION  Patient [name] Birthdate: May 03, 1991 Sex: female Admission Date (Current Location): 08/05/2015  [place] and [place] Number:  Melanie Bell [id number] [place] and Address:  [facility], [address] [place], [place] [address]      Provider Number: [id number]  Attending Physician Name and Address:  [name], MD  Relative Name and Phone Number:  [name] (mother/ legal guardian) [phone]    Current Level of Care: Hospital Recommended Level of Care: Other (Comment) Prior Approval Number:    Date Approved/Denied:   PASRR Number:    Discharge Plan: Other (Comment) ([place]. [place], [place])    Current Diagnoses: Patient Active Problem List   Diagnosis Date Noted  . Foreign body in colon   . Foreign body alimentary tract   . Foreign body ingestion 07/31/2015  . Schizoaffective disorder, bipolar type ([place]) 11/06/2014  . Tobacco use disorder 09/30/2014  . Hypothyroidism 09/29/2014  . Hypertension 09/29/2014  . Constipation 09/29/2014    Orientation RESPIRATION BLADDER Height & Weight     Self, Time, Situation, Place  Normal Continent Weight: 190 lb (86.183 kg) Height:  5\' 6"  (167.6 cm)  BEHAVIORAL SYMPTOMS/MOOD NEUROLOGICAL BOWEL NUTRITION STATUS  Dangerous to self, others or property   Continent Diet  AMBULATORY STATUS COMMUNICATION OF NEEDS Skin   Independent Verbally Normal                       Personal Care Assistance Level of Assistance  Bathing, Feeding, Dressing, Total care Bathing Assistance: Independent Feeding assistance: Independent Dressing Assistance: Independent Total Care Assistance: Independent   Functional Limitations Info  Sight, Hearing, Speech Sight Info: Adequate Hearing Info: Adequate Speech Info: Adequate    SPECIAL CARE FACTORS FREQUENCY                       Contractures Contractures Info: Not present    Additional Factors Info  Psychotropic               Current Medications (09/11/2015):  This is the current hospital active medication list Current Facility-Administered Medications  Medication Dose Route Frequency Provider Last Rate Last Dose  . acetaminophen (TYLENOL) tablet 650 mg  650 mg Oral Q6H PRN [name], MD   650 mg at 09/04/15 1439  . alum & mag hydroxide-simeth (MAALOX/MYLANTA) 200-200-20 MG/5ML suspension 30 mL  30 mL Oral Q4H PRN [name], MD   30 mL at 08/25/15 1951  . amantadine (SYMMETREL) capsule 100 mg  100 mg Oral BID [name], MD   Stopped at 09/11/15 1008  . amLODipine (NORVASC) tablet 2.5 mg  2.5 mg Oral Daily [name], MD   Stopped at 09/11/15 1009  . [START ON 09/12/2015] cefOXitin (MEFOXIN) 2 g in dextrose 5 % 50 mL IVPB  2 g Intravenous On Call to [place], MD      . docusate sodium (COLACE) capsule 200 mg  200 mg Oral BID [name], MD   Stopped at 09/11/15 1009  . fluPHENAZine (PROLIXIN) tablet 5 mg  5 mg Oral 3 times per day [name], MD   5 mg at 09/11/15 0720  . lactulose (CHRONULAC) 10 GM/15ML solution 30 g  30 g Oral TID [name], MD   Stopped at 09/11/15 1009  . levothyroxine (SYNTHROID, LEVOTHROID)  tablet 75 mcg  75 mcg Oral QAC breakfast [name], MD   75 mcg at 09/11/15 0719  . lurasidone (LATUDA) tablet 120 mg  120 mg Oral Q breakfast [name], MD   Stopped at 09/11/15 1009  . magnesium hydroxide (MILK OF MAGNESIA) suspension 30 mL  30 mL Oral Daily PRN [name], MD   30 mL at 08/24/15 1450  . nicotine (NICODERM CQ - dosed in mg/24 hours) patch 21 mg  21 mg Transdermal Daily [name], MD   Stopped at 09/11/15 1010  . Oxcarbazepine (TRILEPTAL) tablet 150 mg  150 mg Oral BID [name], MD   Stopped at 09/11/15 1010  . polyethylene glycol (MIRALAX / GLYCOLAX) packet 17 g  17 g Oral BID [name], MD   Stopped at 09/11/15 1010  . senna (SENOKOT) tablet 17.2 mg  2 tablet Oral QHS [name], MD   17.2 mg at 09/10/15 2122  . traZODone (DESYREL) tablet 100 mg  100 mg Oral QHS [name], MD   100 mg at 09/10/15 2124  . venlafaxine XR (EFFEXOR-XR) 24 hr capsule 150 mg  150 mg Oral Q breakfast [name], MD   Stopped at 09/11/15 1010     Discharge Medications: Please see discharge summary for a list of discharge medications.  Relevant Imaging Results:  Relevant Lab Results:   Additional Information Allergies: Betadine, Shell fish derived products Iodine  SSN # [id number]  Keene Breath, [place] 09/11/2015 [phone]

## 2015-09-11 NOTE — Plan of Care (Signed)
Problem: Alteration in thought process Goal: STG-Patient does not respond to command hallucinations Outcome: Progressing Patient denies SI/HI/AH/VH at this time.

## 2015-09-11 NOTE — Tx Team (Signed)
Interdisciplinary Treatment Plan Update (Adult)  Date:  09/11/2015 Time Reviewed:  3:18 PM  Progress in Treatment: Attending groups: Yes. Participating in groups:  Yes. Taking medication as prescribed:  Yes. Tolerating medication:  Yes. Family/Significant othe contact made:  Yes, individual(s) contacted:  mother and group home Patient understands diagnosis:  Yes. Discussing patient identified problems/goals with staff:  Yes. Medical problems stabilized or resolved:  No. and As evidenced by:  Pt has not passed the battery yet but is going to medical for removal  Denies suicidal/homicidal ideation: Yes. Issues/concerns per patient self-inventory:  Yes. Other:  New problem(s) identified: No, Describe:  NA  Discharge Plan or Barriers: Pt plans to return to group home and follow up with outpatient.    Reason for Continuation of Hospitalization: Hallucinations Medication stabilization Other; describe Pt swallowed objects  Comments: Melanie Bell is a 25 year old female with a history of schizoaffective disorder. She was briefly admitted to medical floor after she swallowed 3 foreign objects including a screw, AA battery and an earring. She was transferred to psychiatry before she passed them. The battery has been there since March 15. Apparently about that he should not stick around the gap for longer than a week according to GI note. She is now being followed by Dr. [name], GI. Surgery refuses to operate until there is perforation and/or peritonitis. The patient is no longer in need of psychiatric care but I could not discharge her to home given her medical condition. She is on liquid diet and laxatives.  Melanie Bell denies any symptoms of depression, anxiety, or psychosis. She denies any abdominal pain. In spite over multiple laxatives and lactulose she has not had a bowel movement yesterday. We will give mag citrate again today as her x-ray indicates massive amounts of stool in the bowels.  She accepts medications and tolerates them well. She participates well in programming.   Estimated length of stay: 0 days, will discharge today Friday 09/11/15 from BMU.  New goal(s): NA  Review of initial/current patient goals per problem list:   1.  Goal(s): Patient will participate in aftercare plan * Met: Yes * Target date: at discharge * As evidenced by: Patient will participate within aftercare plan AEB aftercare provider and housing plan at discharge being identified. 08/18/15: Patient has a provider and will be referred to ACT and has a group home which she can return to at discharge. Goal met.    2.  Goal (s): Patient will exhibit decreased depressive symptoms and suicidal ideations. * Met: Yes *  Target date: at discharge * As evidenced by: Patient will utilize self rating of depression at 3 or below and demonstrate decreased signs of depression or be deemed stable for discharge by MD. 08/18/15: Patient reports no SI and depression of 0. Goal met.   3.  Goal (s): Patient will demonstrate decreased symptoms of psychosis. * Met: yes  *  Target date: at discharge * As evidenced by: Patient will not endorse signs of psychosis or be deemed stable for discharge by MD.  08/18/15: Patient reports no psychosis but is awaiting a batter to pass through her intestines before discharge.  08/20/15: Patient is still awaiting battery which she swallowed to pass and will discharge once this happens. MD is stating this could happen in the next couple of days.  08/25/15: Patient is continued to be monitored for movement of battery she swallowed and cannot be discharged at this time as this is a risk and remains in BMU as  medical floor will not accept patient.  08/27/15: Patient is still waiting for batter to discharge but no luck yet.  09/01/15: Patient has battery in her intestines that she swallowed and will discharge once she passes this per MD. 09/03/15: Patient is awaiting for a battery which she  swallowed to discharge.  09/10/15: Patient went to Endoscopy today but was not able to remove battery. Patient will remain in the hospital for further evaluation or passes the battery.  09/11/15: Patient is discharge to surgery and is stable for discharge per MD from psychiatric treatment.  Attendees: Physician:   Orson Slick, MD 4/28/20173:18 PM  Nursing:   Valetta Close, RN  4/28/20173:18 PM  Other:  Keene Breath, [place] 4/28/20173:18 PM  Other:  4/28/20173:18 PM  Other:  4/28/20173:18 PM  Other:  4/28/20173:18 PM  Other:  4/28/20173:18 PM  Other:   4/28/20173:18 PM   Scribe for Treatment Team:   Keene Breath, MSW, LCSW  09/11/2015, 3:18 PM

## 2015-09-11 NOTE — Progress Notes (Signed)
  [facility] Adult Case Management Discharge Plan :  Will you be returning to the same living situation after discharge:  Yes,  patient can return home once medically stable on medical floor. At discharge, do you have transportation home?: Yes,  Group home will pick up once stable on medical floor Do you have the ability to pay for your medications: Yes,  patient has Medicaid  Release of information consent forms completed and in the chart;  Patient's signature needed at discharge.  Patient to Follow up at: Follow-up Information    Follow up with [facility]. Go on 09/16/2015.   Why:  For follow-up care appt with Rushie Chestnut, NP on Wednesday 09/16/15 at 11:00am   Contact information:   [place] [phone] FAX      Follow up with [facility].   Why:  Patient is referred to [facility] services.   Contact information:   [address] [place], [place] Ph [phone] Fax [phone]       Go to [facility].   Why:  Patient will return to group home at discharge   Contact information:   [place], [place] [address] Ph [phone]      Next level of care provider has access to [facility] and Suicide Prevention discussed: Yes,  SPE discussed with patient and [facility] (group home) [phone])  Have you used any form of tobacco in the last 30 days? (Cigarettes, Smokeless Tobacco, Cigars, and/or Pipes): Yes  Has patient been referred to the Quitline?: Patient refused referral  Patient has been referred for addiction treatment: N/A  Keene Breath, MSW, LCSW 09/11/2015, 3:21 PM [phone]

## 2015-09-11 NOTE — Progress Notes (Signed)
Transferred to Room 218 via stretcher with significant other in attendance.  Report given to RN.  Positioned in bed for comfort, call light in reach.

## 2015-09-11 NOTE — Progress Notes (Signed)
Pt came to floor at 1815. VSS. Pt was oriented to room and safety plan.

## 2015-09-11 NOTE — Progress Notes (Signed)
Recreation Therapy Notes  Date: 04.28.17 Time: 1:00 pm Location: Craft Room  Group Topic: Communication, Problem solving, Teamwork  Goal Area(s) Addresses:  Patient will effectively work with peer towards shared goal. Patient will identify skills used to make activity successful. Patient will identify benefit of using group skills effectively post d/c.  Behavioral Response: Attentive  Intervention: Metallurgist  Activity: Patients were given 15 pipe cleaners and instructed to build a free standing tower using al 15 pipe cleaners. Patients were given 2 minutes to strategize. After approximately 5 minutes of building, patients were instructed to put their dominant hand behind their back. After approximately 3 minutes, patients were instructed to stop talking to each other.  Education: LRT educated patients on healthy support systems.  Education Outcome: In group clarification offered  Clinical Observations/Feedback: Patient did not participate in group or group discussion as patient was waiting to be called for surgery.  Leonette Monarch, LRT/CTRS 09/11/2015 2:51 PM

## 2015-09-11 NOTE — Progress Notes (Signed)
The plan was for exploratory laparotomy after a rigid sigmoidoscopy today for retrieval of foreign body ingested by the patient.  There was a delay in the operating room until this afternoon late when the operating room became available and then was again delayed by a emergency C-section He was decided to reschedule the patient for tomorrow morning as this was not an emergency and staffing would be better in the morning.  Patient will be given clear liquids and transferred to see for expectant surgery tomorrow morning. Patient was understanding of this as was her significant other

## 2015-09-11 NOTE — Anesthesia Preprocedure Evaluation (Addendum)
Anesthesia Evaluation  Patient identified by MRN, date of birth, ID band Patient awake    Reviewed: Allergy & Precautions, H&P , NPO status , Patient's Chart, lab work & pertinent test results, reviewed documented beta blocker date and time   History of Anesthesia Complications Negative for: history of anesthetic complications  Airway Mallampati: I  TM Distance: >3 FB Neck ROM: full    Dental no notable dental hx. (+) Missing, Teeth Intact   Pulmonary neg shortness of breath, asthma (exercise induced) , neg sleep apnea, neg COPD, neg recent URI, Current Smoker,    Pulmonary exam normal breath sounds clear to auscultation       Cardiovascular Exercise Tolerance: Good hypertension, (-) angina(-) CAD, (-) Past MI, (-) Cardiac Stents and (-) CABG Normal cardiovascular exam(-) dysrhythmias (-) Valvular Problems/Murmurs Rhythm:regular Rate:Normal     Neuro/Psych PSYCHIATRIC DISORDERS (Depression and schizoaffective disorder) Anxiety Depression Bipolar Disorder Schizophrenia negative neurological ROS     GI/Hepatic Neg liver ROS, GERD  ,  Endo/Other  neg diabetesHypothyroidism   Renal/GU negative Renal ROS  negative genitourinary   Musculoskeletal   Abdominal   Peds  Hematology negative hematology ROS (+)   Anesthesia Other Findings Past Medical History:   Hallucinations                                  09/30/2014      Comment:Sizoaffective   Depression                                                   Anxiety                                                      Hypertension                                                 Tardive dyskinesia                              10/2014         Comment:recent onset   GERD (gastroesophageal reflux disease)                       Hyperlipidemia                                               Reproductive/Obstetrics negative OB ROS                             Anesthesia Physical  Anesthesia Plan  ASA: III  Anesthesia Plan: General   Post-op Pain Management:    Induction:   Airway Management Planned:   Additional Equipment:   Intra-op Plan:   Post-operative Plan:   Informed Consent: I have  reviewed the patients History and Physical, chart, labs and discussed the procedure including the risks, benefits and alternatives for the proposed anesthesia with the patient or authorized representative who has indicated his/her understanding and acceptance.   Dental Advisory Given  Plan Discussed with: Anesthesiologist, CRNA and Surgeon  Anesthesia Plan Comments:         Anesthesia Quick Evaluation
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
## 2015-09-12 ENCOUNTER — Ambulatory Visit: Payer: Medicaid Other | Admitting: Anesthesiology

## 2015-09-12 ENCOUNTER — Encounter
Admission: RE | Disposition: A | Discharge status: Home / Self Care | Payer: Self-pay | Source: Ambulatory Visit | Attending: Surgery

## 2015-09-12 ENCOUNTER — Inpatient Hospital Stay: Payer: Medicaid Other

## 2015-09-12 ENCOUNTER — Encounter: Payer: Self-pay | Admitting: Anesthesiology

## 2015-09-12 HISTORY — PX: SIGMOIDOSCOPY: SHX6686

## 2015-09-12 HISTORY — PX: LAPAROTOMY: SHX154

## 2015-09-12 LAB — BASIC METABOLIC PANEL
ANION GAP: 8 (ref 5–15)
BUN: 7 mg/dL (ref 6–20)
CALCIUM: 9.7 mg/dL (ref 8.9–10.3)
CO2: 23 mmol/L (ref 22–32)
Chloride: 111 mmol/L (ref 101–111)
Creatinine, Ser: 0.66 mg/dL (ref 0.44–1.00)
GFR calc Af Amer: >60 mL/min (ref >60)
GLUCOSE: 92 mg/dL (ref 65–99)
Potassium: 3.9 mmol/L (ref 3.5–5.1)
SODIUM: 142 mmol/L (ref 135–145)

## 2015-09-12 LAB — CBC
HCT: 35.7 % (ref 35.0–47.0)
HEMOGLOBIN: 12.3 g/dL (ref 12.0–16.0)
MCH: 31.1 pg (ref 26.0–34.0)
MCHC: 34.4 g/dL (ref 32.0–36.0)
MCV: 90.5 fL (ref 80.0–100.0)
Platelets: 160 10*3/uL (ref 150–440)
RBC: 3.95 MIL/uL (ref 3.80–5.20)
RDW: 13.6 % (ref 11.5–14.5)
WBC: 5 10*3/uL (ref 3.6–11.0)

## 2015-09-12 IMAGING — CR DG ABDOMEN 1V
1 series · 1 of 1 positions shown · non-contrast
Comparison: [DATE]

CLINICAL DATA: Followup ingestion of 3 foreign bodies preop for
surgical removal today

EXAM:
ABDOMEN - 1 VIEW

[abdomen kub]
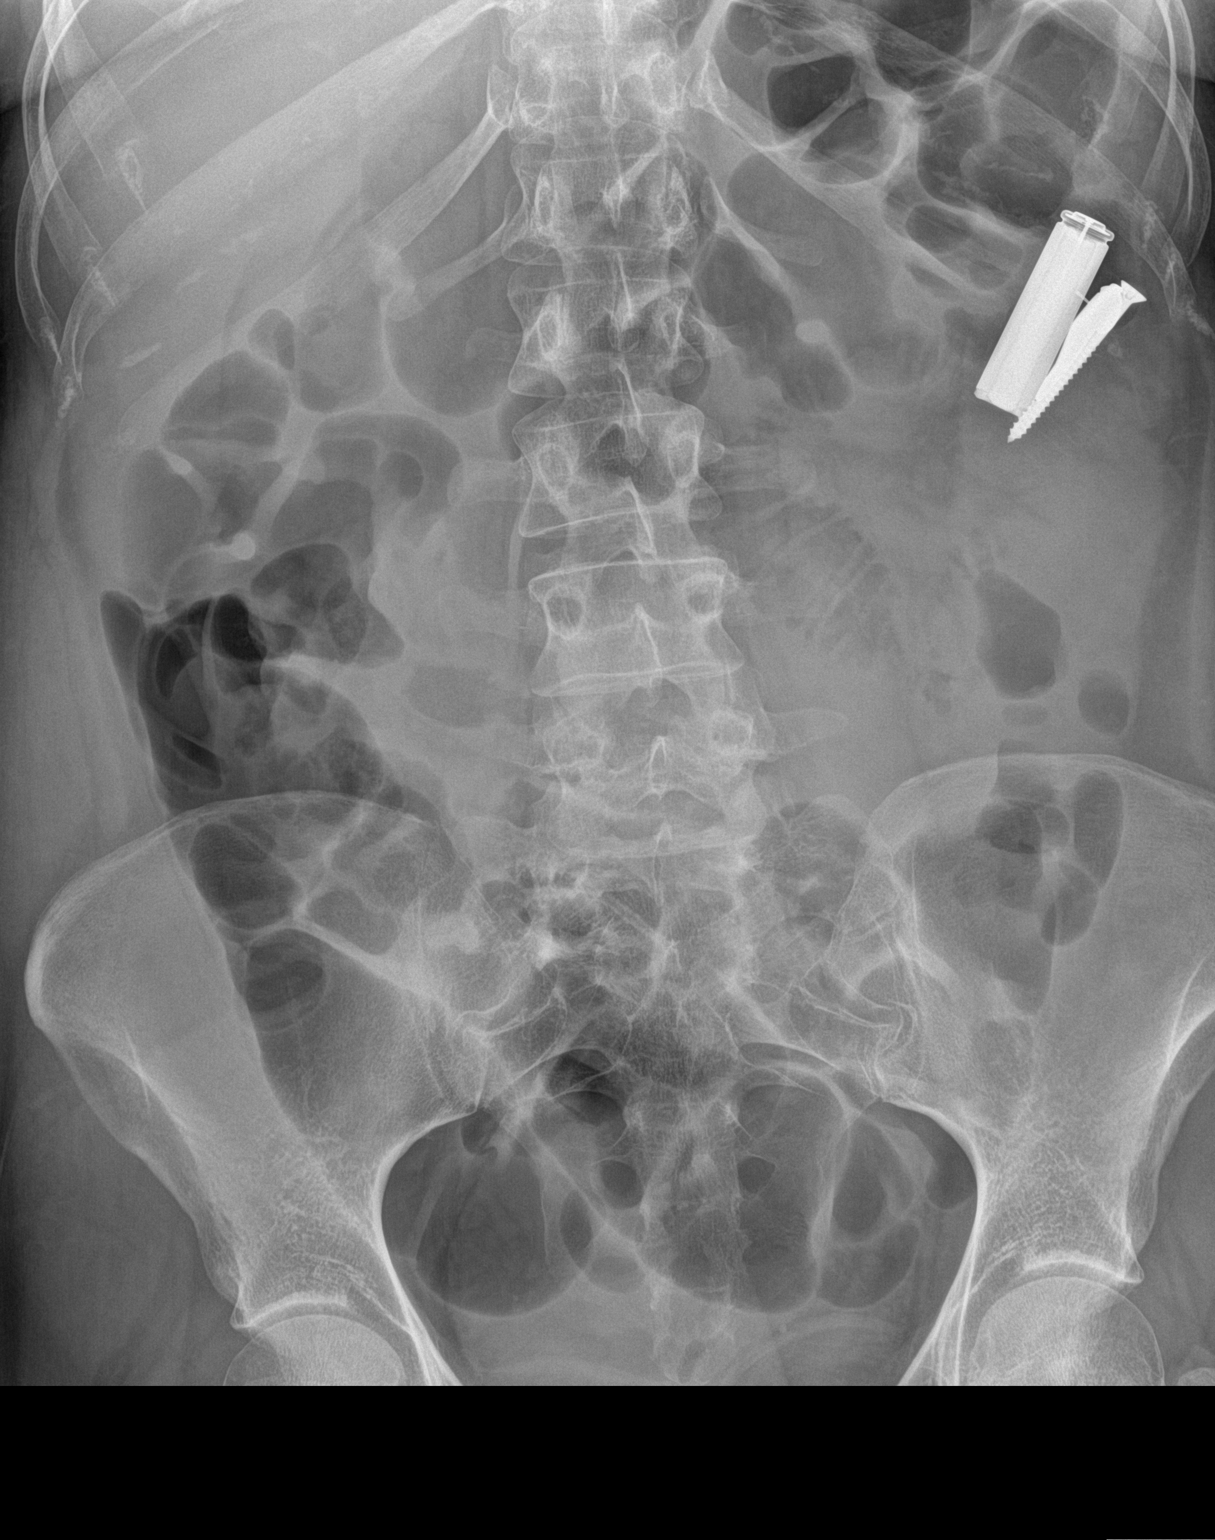

[1 of 1 positions shown; findings below may reference images not displayed]

FINDINGS: All 3 ingested foreign bodies project over the left upper quadrant.
The supine radiograph not sensitive for possibility of
pneumoperitoneum with no obvious findings to suggest this. Mildly
prominent loop of small bowel left upper quadrant may reflect
localized ileus. No definite obstruction. However, descending and
sigmoid colon appear relatively mildly decompressed.
IMPRESSION: Three ingested foreign bodies projecting over the left upper
quadrant

## 2015-09-12 SURGERY — LAPAROTOMY, EXPLORATORY
Anesthesia: General

## 2015-09-12 MED ORDER — LACTATED RINGERS IV SOLN
INTRAVENOUS | Status: DC
  Administered 2015-09-12 – 2015-09-16 (×9): via INTRAVENOUS

## 2015-09-12 MED ORDER — MUPIROCIN 2 % EX OINT
1.0000 "application " | TOPICAL_OINTMENT | Freq: Two times a day (BID) | CUTANEOUS | Status: DC
  Administered 2015-09-12 – 2015-09-16 (×9): 1 via NASAL
  Filled 2015-09-12: qty 22

## 2015-09-12 MED ORDER — MORPHINE SULFATE (PF) 2 MG/ML IV SOLN: 2.0000 mg | INTRAVENOUS | Status: DC | PRN

## 2015-09-12 MED ORDER — SUCCINYLCHOLINE CHLORIDE 20 MG/ML IJ SOLN
INTRAMUSCULAR | Status: DC | PRN
  Administered 2015-09-12: 120 mg via INTRAVENOUS

## 2015-09-12 MED ORDER — CHLORHEXIDINE GLUCONATE CLOTH 2 % EX PADS
6.0000 | MEDICATED_PAD | Freq: Every day | CUTANEOUS | Status: AC
  Administered 2015-09-12 – 2015-09-16 (×5): 6 via TOPICAL

## 2015-09-12 MED ORDER — BUPIVACAINE-EPINEPHRINE (PF) 0.25% -1:200000 IJ SOLN
INTRAMUSCULAR | Status: AC
  Filled 2015-09-12: qty 30

## 2015-09-12 MED ORDER — ROCURONIUM BROMIDE 100 MG/10ML IV SOLN
INTRAVENOUS | Status: DC | PRN
  Administered 2015-09-12: 40 mg via INTRAVENOUS

## 2015-09-12 MED ORDER — NEOSTIGMINE METHYLSULFATE 10 MG/10ML IV SOLN
INTRAVENOUS | Status: DC | PRN
  Administered 2015-09-12: 4 mg via INTRAVENOUS

## 2015-09-12 MED ORDER — LIDOCAINE HCL (CARDIAC) 20 MG/ML IV SOLN
INTRAVENOUS | Status: DC | PRN
  Administered 2015-09-12: 30 mg via INTRAVENOUS

## 2015-09-12 MED ORDER — KETOROLAC TROMETHAMINE 30 MG/ML IJ SOLN
INTRAMUSCULAR | Status: DC | PRN
  Administered 2015-09-12: 30 mg via INTRAVENOUS

## 2015-09-12 MED ORDER — PROPOFOL 10 MG/ML IV BOLUS
INTRAVENOUS | Status: DC | PRN
  Administered 2015-09-12: 200 mg via INTRAVENOUS

## 2015-09-12 MED ORDER — ONDANSETRON HCL 4 MG/2ML IJ SOLN
INTRAMUSCULAR | Status: DC | PRN
  Administered 2015-09-12: 4 mg via INTRAVENOUS

## 2015-09-12 MED ORDER — FENTANYL CITRATE (PF) 100 MCG/2ML IJ SOLN
INTRAMUSCULAR | Status: AC
Start: 2015-09-12 — End: 2015-09-12
  Filled 2015-09-12: qty 2

## 2015-09-12 MED ORDER — OXYCODONE-ACETAMINOPHEN 5-325 MG PO TABS
1.0000 | ORAL_TABLET | ORAL | Status: DC | PRN
  Administered 2015-09-12 – 2015-09-16 (×12): 1 via ORAL
  Filled 2015-09-12 (×12): qty 1

## 2015-09-12 MED ORDER — FENTANYL CITRATE (PF) 100 MCG/2ML IJ SOLN
25.0000 ug | INTRAMUSCULAR | Status: DC | PRN
  Administered 2015-09-12 (×2): 25 ug via INTRAVENOUS

## 2015-09-12 MED ORDER — MIDAZOLAM HCL 2 MG/2ML IJ SOLN
INTRAMUSCULAR | Status: DC | PRN
  Administered 2015-09-12: 2 mg via INTRAVENOUS

## 2015-09-12 MED ORDER — FENTANYL CITRATE (PF) 100 MCG/2ML IJ SOLN
INTRAMUSCULAR | Status: DC | PRN
  Administered 2015-09-12: 50 ug via INTRAVENOUS
  Administered 2015-09-12 (×2): 100 ug via INTRAVENOUS

## 2015-09-12 MED ORDER — ONDANSETRON HCL 4 MG/2ML IJ SOLN: 4.0000 mg | Freq: Once | INTRAMUSCULAR | Status: DC | PRN

## 2015-09-12 MED ORDER — GLYCOPYRROLATE 0.2 MG/ML IJ SOLN
INTRAMUSCULAR | Status: DC | PRN
  Administered 2015-09-12: .6 mg via INTRAVENOUS

## 2015-09-12 MED ORDER — LIDOCAINE HCL (PF) 1 % IJ SOLN
INTRAMUSCULAR | Status: DC | PRN
  Administered 2015-09-12: 30 mL

## 2015-09-12 MED ORDER — DEXAMETHASONE SODIUM PHOSPHATE 10 MG/ML IJ SOLN
INTRAMUSCULAR | Status: DC | PRN
  Administered 2015-09-12: 5 mg via INTRAVENOUS

## 2015-09-12 SURGICAL SUPPLY — 44 items
BLADE SURG 11 STRL SS SAFETY (MISCELLANEOUS) ×3 IMPLANT
BLADE SURG 15 STRL SS SAFETY (BLADE) ×3 IMPLANT
BRIEF STRETCH MATERNITY 2XLG (MISCELLANEOUS) ×3 IMPLANT
CANISTER SUCT 1200ML W/VALVE (MISCELLANEOUS) ×3 IMPLANT
CATH TRAY 16F METER LATEX (MISCELLANEOUS) IMPLANT
CHLORAPREP W/TINT 26ML (MISCELLANEOUS) ×3 IMPLANT
DRAPE LAPAROTOMY 100X77 ABD (DRAPES) ×3 IMPLANT
DRAPE LAPAROTOMY 77X122 PED (DRAPES) ×3 IMPLANT
DRAPE LEGGINS SURG 28X43 STRL (DRAPES) ×3 IMPLANT
DRAPE UNDER BUTTOCK W/FLU (DRAPES) ×3 IMPLANT
DRSG OPSITE POSTOP 4X12 (GAUZE/BANDAGES/DRESSINGS) ×3 IMPLANT
DRSG TELFA 3X8 NADH (GAUZE/BANDAGES/DRESSINGS) ×3 IMPLANT
ELECT REM PT RETURN 9FT ADLT (ELECTROSURGICAL) ×3
ELECTRODE REM PT RTRN 9FT ADLT (ELECTROSURGICAL) ×1 IMPLANT
GAUZE SPONGE 4X4 12PLY STRL (GAUZE/BANDAGES/DRESSINGS) ×3 IMPLANT
GLOVE BIO SURGEON STRL SZ7 (GLOVE) ×3 IMPLANT
GLOVE BIO SURGEON STRL SZ8 (GLOVE) ×3 IMPLANT
GOWN STRL REUS W/ TWL LRG LVL3 (GOWN DISPOSABLE) ×2 IMPLANT
GOWN STRL REUS W/TWL LRG LVL3 (GOWN DISPOSABLE) ×4
KIT RM TURNOVER CYSTO AR (KITS) ×3 IMPLANT
KIT RM TURNOVER STRD PROC AR (KITS) ×3 IMPLANT
LABEL OR SOLS (LABEL) ×3 IMPLANT
NDL SAFETY 22GX1.5 (NEEDLE) ×3 IMPLANT
NEEDLE HYPO 25X1 1.5 SAFETY (NEEDLE) ×3 IMPLANT
NS IRRIG 1000ML POUR BTL (IV SOLUTION) ×3 IMPLANT
PACK BASIN MAJOR ARMC (MISCELLANEOUS) ×3 IMPLANT
PACK BASIN MINOR ARMC (MISCELLANEOUS) ×3 IMPLANT
PACK COLON CLEAN CLOSURE (MISCELLANEOUS) ×3 IMPLANT
PAD OB MATERNITY 4.3X12.25 (PERSONAL CARE ITEMS) ×3 IMPLANT
PAD PREP 24X41 OB/GYN DISP (PERSONAL CARE ITEMS) ×3 IMPLANT
SEPRAFILM MEMBRANE 5X6 (MISCELLANEOUS) ×3 IMPLANT
SOL PREP PVP 2OZ (MISCELLANEOUS) ×3
SOLUTION PREP PVP 2OZ (MISCELLANEOUS) ×1 IMPLANT
STAPLER SKIN PROX 35W (STAPLE) ×3 IMPLANT
SURGILUBE 2OZ TUBE FLIPTOP (MISCELLANEOUS) ×3 IMPLANT
SUT PDS AB 1 TP1 54 (SUTURE) ×6 IMPLANT
SUT PROLENE 0 CT 1 30 (SUTURE) ×9 IMPLANT
SUT SILK 3-0 (SUTURE) ×3 IMPLANT
SUT VIC AB 3-0 SH 27 (SUTURE) ×2
SUT VIC AB 3-0 SH 27X BRD (SUTURE) ×1 IMPLANT
SUT VICRYL 2 0 18  UND BR (SUTURE) ×2
SUT VICRYL 2 0 18 UND BR (SUTURE) ×1 IMPLANT
SYR CONTROL 10ML (SYRINGE) ×3 IMPLANT
SYRINGE 10CC LL (SYRINGE) ×3 IMPLANT

## 2015-09-12 NOTE — Progress Notes (Signed)
Patient with no complaints this morning. She is ready for surgery.  Vital signs are stable soft nontender abdomen CTA RR.  KUB is not yet been performed but will be reviewed preoperatively as was the previous KUBs and CT scans and labs. I again discussed with she and her significant other the rationale for surgery the options of observation the risks of bleeding infection inability to remove the foreign bodies or find all 3 foreign bodies and the procedure itself she understood and agreed to proceed

## 2015-09-12 NOTE — Transfer of Care (Signed)
Immediate Anesthesia Transfer of Care Note  Patient: Melanie Bell  Procedure(s) Performed: Procedure(s): EXPLORATORY LAPAROTOMY (N/A) SIGMOIDOSCOPY (N/A)  Patient Location: PACU  Anesthesia Type:General  Level of Consciousness: awake, alert  and oriented  Airway & Oxygen Therapy: Patient Spontanous Breathing and Patient connected to face mask oxygen  Post-op Assessment: Report given to RN and Post -op Vital signs reviewed and stable  Post vital signs: Reviewed and stable  Last Vitals:  Filed Vitals:   09/12/15 0546 09/12/15 0854  BP: 112/63 118/77  Pulse: 58   Temp: 36.8 C   Resp: 16     Last Pain:  Filed Vitals:   09/12/15 0854  PainSc: 0-No pain         Complications: No apparent anesthesia complications

## 2015-09-12 NOTE — Op Note (Signed)
Pre-operative Diagnosis: Ingested foreign body  Post-operative Diagnosis: Ingested foreign body  Surgeon: Phoebe Perch   Assistants: Surgical tech  Procedure: Rigid sigmoidoscopy, exploratory laparotomy, colotomy and retrieval of 3 foreign bodies of transverse colon  Surgeon: Jerrol Banana. Burt Knack, MD FACS  Anesthesia: Gen. with endotracheal tube   Procedure Details  The patient was seen again in the Holding Room. The benefits, complications, treatment options, and expected outcomes were discussed with the patient. The risks of bleeding, infection, recurrence of symptoms, failure to resolve symptoms,  bowel injury, any of which could require further surgery were reviewed with the patient.   The patient was taken to Operating Room, identified as Melanie Bell and the procedure verified.  A Time Out was held and the above information confirmed.  Prior to the induction of general anesthesia, antibiotic prophylaxis was administered. VTE prophylaxis was in place. General endotracheal anesthesia was then administered and tolerated well. The patient was placed in a lithotomy position  A digital rectal exam was performed followed by a rigid sigmoidoscopy to the sacral promontory area only. There was redundancy and angulation and I was unable to advance the rigid sigmoidoscope passed the rectum. The rigid sigmoidoscope demonstrated no abnormalities and the procedure was terminated.  After the induction, the abdomen was prepped with Chloraprep and draped in the sterile fashion. The patient was positioned in the supine position.  Confirmation of surgical post was performed and then a midline incision was utilized utilizing the previous scar to the right of the umbilicus. The abdominal cavity was explored and adhesions were taken down there were minimal adhesions. Palpation of the sigmoid colon and rectum failed to identify the mass is palpation up the left colon was performed until masses were  palpable in these hepatic flexure and transverse colon. The incision was extended slightly cephalad and a very redundant transverse colon was elevated into the wound. (One could surmise that the previous KUBs that showed that the foreign body may have been in the sigmoid suggest that with the redundant transverse colon these foreign bodies could easily have been in the mid transverse colon that was considerably redundant and hanging down into the mid abdomen and pelvis.)  The 3 foreign bodies were grouped together and bowel clamps were placed on either side of them. A colotomy was performed on the tinea and all 3 foreign bodies were retrieved quite easily and sent off for examination. The colotomy was then closed in a 2 layer fashion utilizing 3-0 Monocryl and 3-0 silk interrupted Lembert sutures and there was no sign of leak. The bowel clamps are removed. Abdominal viscera were placed back into anatomic position. The sponge lap needle count was correct at this point and a piece of Seprafilm was placed and the wound was closed with running #1 PDS. Marcaine was placed for a total of 30 cc and then skin staples were placed.  Patient tolerated the procedure well there were no complications she was taken to recovery room in stable condition to be admitted for continued care a sterile dressing was placed and sponge lap needle count was correct.    Findings: 3 separate foreign bodies in the transverse colon. One was a AA battery radio VAC brand, there was a triangular shaped earring, and a drywall screw size 8 x 2"   Estimated Blood Loss: Nil         Drains: None         Specimens: 3 foreign body sent for gross only examination  Complications: None                  Condition: Stable   Melanie Bedwell E. Burt Knack, MD, FACS

## 2015-09-12 NOTE — Anesthesia Postprocedure Evaluation (Signed)
Anesthesia Post Note  Patient: Melanie Bell  Procedure(s) Performed: Procedure(s) (LRB): EXPLORATORY LAPAROTOMY (N/A) SIGMOIDOSCOPY (N/A)  Patient location during evaluation: PACU Anesthesia Type: General Level of consciousness: awake and alert Pain management: pain level controlled Vital Signs Assessment: post-procedure vital signs reviewed and stable Respiratory status: spontaneous breathing, nonlabored ventilation, respiratory function stable and patient connected to nasal cannula oxygen Cardiovascular status: blood pressure returned to baseline and stable Postop Assessment: no signs of nausea or vomiting Anesthetic complications: no    Last Vitals:  Filed Vitals:   09/12/15 1250 09/12/15 1358  BP: 113/67 114/59  Pulse: 59 59  Temp: 36.3 C 36.6 C  Resp: 12 16    Last Pain:  Filed Vitals:   09/12/15 1358  PainSc: [place]

## 2015-09-13 ENCOUNTER — Encounter: Payer: Self-pay | Admitting: Surgery

## 2015-09-13 LAB — CBC WITH DIFFERENTIAL/PLATELET
Basophils Absolute: 0 10*3/uL (ref 0–0.1)
Basophils Relative: 0 %
EOS ABS: 0 10*3/uL (ref 0–0.7)
EOS PCT: 0 %
HCT: 33.7 % — ABNORMAL LOW (ref 35.0–47.0)
Hemoglobin: 11.5 g/dL — ABNORMAL LOW (ref 12.0–16.0)
LYMPHS ABS: 2.5 10*3/uL (ref 1.0–3.6)
Lymphocytes Relative: 34 %
MCH: 31.1 pg (ref 26.0–34.0)
MCHC: 34.2 g/dL (ref 32.0–36.0)
MCV: 91 fL (ref 80.0–100.0)
MONOS PCT: 9 %
Monocytes Absolute: 0.6 10*3/uL (ref 0.2–0.9)
Neutro Abs: 4.1 10*3/uL (ref 1.4–6.5)
Neutrophils Relative %: 57 %
PLATELETS: 154 10*3/uL (ref 150–440)
RBC: 3.71 MIL/uL — ABNORMAL LOW (ref 3.80–5.20)
RDW: 13.5 % (ref 11.5–14.5)
WBC: 7.2 10*3/uL (ref 3.6–11.0)

## 2015-09-13 LAB — BASIC METABOLIC PANEL
Anion gap: 6 (ref 5–15)
BUN: 7 mg/dL (ref 6–20)
CALCIUM: 8.8 mg/dL — AB (ref 8.9–10.3)
CO2: 24 mmol/L (ref 22–32)
CREATININE: 0.68 mg/dL (ref 0.44–1.00)
Chloride: 109 mmol/L (ref 101–111)
GFR calc Af Amer: >60 mL/min (ref >60)
Glucose, Bld: 108 mg/dL — ABNORMAL HIGH (ref 65–99)
Potassium: 3.5 mmol/L (ref 3.5–5.1)
SODIUM: 139 mmol/L (ref 135–145)

## 2015-09-13 NOTE — Progress Notes (Signed)
1 Day Post-Op  Subjective: Patient feels quite well today she is tolerating a clear liquid diet has minimal pain no nausea or vomiting.  Objective: Vital signs in last 24 hours: Temp:  [97.4 F (36.3 C)-98.4 F (36.9 C)] 98.2 F (36.8 C) (04/30 0514) Pulse Rate:  [51-77] 74 (04/30 0514) Resp:  [12-21] 18 (04/30 0514) BP: (90-127)/(48-85) 127/77 mmHg (04/30 0514) SpO2:  [97 %-100 %] 97 % (04/30 0514) Last BM Date: 09/11/15  Intake/Output from previous day: 04/29 0701 - 04/30 0700 In: 3507 [P.O.:480; I.V.:3027] Out: 1810 [Urine:1800; Blood:10] Intake/Output this shift:    Physical exam:  Wounds healing well without erythema or drainage abdomen is distended and somewhat obese nontender with the exception around the incision. Calves are nontender. Patient is awake alert and oriented and acting appropriately  Lab Results: CBC   Recent Labs  09/12/15 0602 09/13/15 0545  WBC 5.0 7.2  HGB 12.3 11.5*  HCT 35.7 33.7*  PLT 160 154   BMET  Recent Labs  09/12/15 0602 09/13/15 0545  NA 142 139  K 3.9 3.5  CL 111 109  CO2 23 24  GLUCOSE 92 108*  BUN 7 7  CREATININE 0.66 0.68  CALCIUM 9.7 8.8*   PT/INR No results for input(s): LABPROT, INR in the last 72 hours. ABG No results for input(s): PHART, HCO3 in the last 72 hours.  Invalid input(s): PCO2, PO2  Studies/Results: Abd 1 View (kub)  09/12/2015  CLINICAL DATA:  Followup ingestion of 3 foreign bodies preop for surgical removal today EXAM: ABDOMEN - 1 VIEW COMPARISON:  09/10/2015 FINDINGS: All 3 ingested foreign bodies project over the left upper quadrant. The supine radiograph not sensitive for possibility of pneumoperitoneum with no obvious findings to suggest this. Mildly prominent loop of small bowel left upper quadrant may reflect localized ileus. No definite obstruction. However, descending and sigmoid colon appear relatively mildly decompressed. IMPRESSION: Three ingested foreign bodies projecting over the left  upper quadrant Electronically Signed   By: Skipper Cliche M.D.   On: 09/12/2015 09:16    Anti-infectives: Anti-infectives    Start     Dose/Rate Route Frequency Ordered Stop   09/11/15 1608  cefOXItin (MEFOXIN) 2-2.2 GM-% IVPB    Comments:  Rivka Spring: cabinet override      09/11/15 1608 09/12/15 0414   09/11/15 1545  cefOXItin (MEFOXIN) 2 g in dextrose 50 mL IVPB (premix)     2 g 100 mL/hr over 30 Minutes Intravenous  Once 09/11/15 1541 09/12/15 0959      Assessment/Plan: s/p Procedure(s): EXPLORATORY LAPAROTOMY SIGMOIDOSCOPY   Patient status post exploratory laparotomy and colon colotomy for removal of 3 separate foreign bodies. He is doing very well at this time but I will keep her on clear liquids and not advance her diet for a day or 2 and then advance diet and discharge. I will last psychiatry to give opinions on any necessary changes tomorrow.  Florene Glen, MD, FACS  09/13/2015

## 2015-09-14 LAB — GLUCOSE, CAPILLARY: GLUCOSE-CAPILLARY: 83 mg/dL (ref 65–99)

## 2015-09-14 NOTE — Plan of Care (Signed)
Problem: Activity: Goal: Ability to return to normal activity level will improve Outcome: Progressing Pt has been walking around the nurses station multiple times this shift without assistance.

## 2015-09-14 NOTE — Progress Notes (Signed)
25 yr old female POD#2 from Open transverse colotomy with removal of 3 foreign bodies.  Patient doing well today. Patient has gotten up and walked approximately 4 laps around nurses station today. Patient states that she's been getting the pain medication and that has been helping with the pain. Patient has been on clear liquids and has been tolerating this well. Patient has been passing flatus and has had 2 bowel movements today. Patient would like more to eat.  Filed Vitals:   09/14/15 0944 09/14/15 1436  BP: 128/72 115/69  Pulse: 66 63  Temp:  98 F (36.7 C)  Resp:  17   I/O last 3 completed shifts: In: 5025.6 [P.O.:1740; I.V.:3285.6] Out: 4000 [Urine:4000] Total I/O In: 2928 [P.O.:1854; I.V.:1074] Out: 2750 [Urine:2750]   PE:  Gen: NAD Abd: soft, obese, non-distended, incision clean and dry, no erythema Ext: 2+ pulses, no edema  CBC Latest Ref Rng 09/13/2015 09/12/2015 09/11/2015  WBC 3.6 - 11.0 K/uL 7.2 5.0 7.1  Hemoglobin 12.0 - 16.0 g/dL 11.5(L) 12.3 12.9  Hematocrit 35.0 - 47.0 % 33.7(L) 35.7 38.2  Platelets 150 - 440 K/uL 154 160 179   CMP Latest Ref Rng 09/13/2015 09/12/2015 09/11/2015  Glucose 65 - 99 mg/dL 108(H) 92 -  BUN 6 - 20 mg/dL 7 7 -  Creatinine 0.44 - 1.00 mg/dL 0.68 0.66 0.67  Sodium 135 - 145 mmol/L 139 142 -  Potassium 3.5 - 5.1 mmol/L 3.5 3.9 -  Chloride 101 - 111 mmol/L 109 111 -  CO2 22 - 32 mmol/L 24 23 -  Calcium 8.9 - 10.3 mg/dL 8.8(L) 9.7 -  Total Protein 6.5 - 8.1 g/dL - - -  Total Bilirubin 0.3 - 1.2 mg/dL - - -  Alkaline Phos 38 - 126 U/L - - -  AST 15 - 41 U/L - - -  ALT 14 - 54 U/L - - -    A/P:  25 yr old female POD#2 from Open transverse colotomy with removal of 3 Foreign bodies.  Pain: continue po and iv prns Advance to regular diet today Encourage ambulation  Possible d/c tomorrow

## 2015-09-14 NOTE — Clinical Social Work Note (Signed)
Clinical Social Work Assessment  Patient Details  [name] MRN: [id number] Date of Birth: January 07, 1991  Date of referral:  09/14/15               Reason for consult:  Facility Placement                Permission sought to share information with:   (Patient has guardian: Her mother: [name]: [phone]) Permission granted to share information::     Name::        Agency::     Relationship::     Contact Information:     Housing/Transportation Living arrangements for the past 2 months:  Group Home Source of Information:  Patient, Parent Patient Interpreter Needed:  None Criminal Activity/Legal Involvement Pertinent to Current Situation/Hospitalization:  No - Comment as needed Significant Relationships:  Other(Comment), Parents Lives with:  Facility Resident Do you feel safe going back to the place where you live?  Yes Need for family participation in patient care:  Yes (Comment) (patient has a legal guardian)  Care giving concerns:  Patient resides at [facility].   Social Worker assessment / plan:  Patient admitted to medical floor from [facility] where she was receiving treatment for recurrent ingestion of dangerous objects. Patient has a history of ingesting inappropriate things. The CSW in [place] documented patient was to return to her group home at discharge. CSW met with patient this afternoon and she had a female visitor. Patient stated that she was not going to return to her group home and was going to live with her boyfriend when discharged. Patient's visitor stated she was the mother of patient's boyfriend and was okay with patient doing this. CSW reminded patient that she has a guardian and that we would have to contact her guardian (which is her mother). CSW touched base with [facility] CSW this afternoon to see if he had heard anything about this change in plans from patient. The [facility] CSW stated he had not heard about this and contacted patient's mother to see if  she knew of any of these plans. CSW received call shortly after from the [place] stating that he had spoken with patient's mother and she wants patient to return to her group home and not discharge with her boyfriend. This CSW also contacted patient's guardian/mother and she confirmed to [place] that she absolutely did not want patient discharging with a boyfriend and that she was to discharge back to the group home. CSW has informed patient of her having to return to her group home. CSW has alerted patient's nurse as tends to act out by ingesting things for attention when she does not get her way.   Employment status:  Disabled (Comment on whether or not currently receiving Disability) Insurance information:  Medicaid In [place] PT Recommendations:  Not assessed at this time Information / Referral to community resources:     Patient/Family's Response to care:  Patient not happy about returning to her group home but is willing to. Patient's mother expressed appreciation for CSW assistance.  Patient/Family's Understanding of and Emotional Response to Diagnosis, Current Treatment, and Prognosis:  Patient's mother/guardian expressed frustration with patient and stated that "everything makes sense now...she always acts out for attention when she knows she is doing something she shouldn't."   Emotional Assessment Appearance:  Appears stated age Attitude/Demeanor/Rapport:  Attention Seeking Affect (typically observed):    Orientation:  Oriented to Self, Oriented to Place, Oriented to  Time, Oriented  to Situation Alcohol / Substance use:  Not Applicable Psych involvement (Current and /or in the community):  Yes (Comment)  Discharge Needs  Concerns to be addressed:  Care Coordination Readmission within the last 30 days:  No Current discharge risk:  Psychiatric Illness Barriers to Discharge:  No Barriers Identified   Shela Leff, LCSW 09/14/2015, 2:39 PM

## 2015-09-15 LAB — SURGICAL PATHOLOGY

## 2015-09-15 NOTE — Progress Notes (Signed)
25 yr old female POD#3 from Open transverse colotomy with removal of 3 foreign bodies.  Patient has been up and walking.  She had regular diet and tolerated well.  She has been passing gas and having BMs. States pain well controlled  Filed Vitals:   09/15/15 0959 09/15/15 1255  BP: 129/67 136/74  Pulse: 65 81  Temp:  98 F (36.7 C)  Resp:  17   I/O last 3 completed shifts: In: [id number] [P.O.:2094; I.V.:2839] Out: 5050 [Urine:5050] Total I/O In: 1141.3 [P.O.:560; I.V.:581.3] Out: 2400 [Urine:2400]   PE:  Gen: NAD Abd: soft, obese, non-distended, incision clean and dry, no erythema Ext: 2+ pulses, no edema  CBC Latest Ref Rng 09/13/2015 09/12/2015 09/11/2015  WBC 3.6 - 11.0 K/uL 7.2 5.0 7.1  Hemoglobin 12.0 - 16.0 g/dL 11.5(L) 12.3 12.9  Hematocrit 35.0 - 47.0 % 33.7(L) 35.7 38.2  Platelets 150 - 440 K/uL 154 160 179   CMP Latest Ref Rng 09/13/2015 09/12/2015 09/11/2015  Glucose 65 - 99 mg/dL 108(H) 92 -  BUN 6 - 20 mg/dL 7 7 -  Creatinine 0.44 - 1.00 mg/dL 0.68 0.66 0.67  Sodium 135 - 145 mmol/L 139 142 -  Potassium 3.5 - 5.1 mmol/L 3.5 3.9 -  Chloride 101 - 111 mmol/L 109 111 -  CO2 22 - 32 mmol/L 24 23 -  Calcium 8.9 - 10.3 mg/dL 8.8(L) 9.7 -  Total Protein 6.5 - 8.1 g/dL - - -  Total Bilirubin 0.3 - 1.2 mg/dL - - -  Alkaline Phos 38 - 126 U/L - - -  AST 15 - 41 U/L - - -  ALT 14 - 54 U/L - - -    A/P:  25 yr old female POD#3 from Open transverse colotomy with removal of 3 Foreign bodies.  Pain: continue po and iv prns Continue regular diet Encourage ambulation  Possible d/c tomorrow to her group home

## 2015-09-15 NOTE — Plan of Care (Signed)
Problem: Activity: Goal: Ability to return to normal activity level will improve Outcome: Progressing Pt ambulated twice around the nursing station without assistance. She did very well with minimal stomach pain.

## 2015-09-16 LAB — CBC
HEMATOCRIT: 30.8 % — AB (ref 35.0–47.0)
HEMOGLOBIN: 10.5 g/dL — AB (ref 12.0–16.0)
MCH: 30.6 pg (ref 26.0–34.0)
MCHC: 33.9 g/dL (ref 32.0–36.0)
MCV: 90.1 fL (ref 80.0–100.0)
Platelets: 140 10*3/uL — ABNORMAL LOW (ref 150–440)
RBC: 3.42 MIL/uL — AB (ref 3.80–5.20)
RDW: 13.8 % (ref 11.5–14.5)
WBC: 4.7 10*3/uL (ref 3.6–11.0)

## 2015-09-16 MED ORDER — OXYCODONE-ACETAMINOPHEN 5-325 MG PO TABS: 1.0000 | ORAL_TABLET | ORAL | Status: DC | PRN

## 2015-09-16 NOTE — Progress Notes (Signed)
4 Days Post-Op  Subjective: She feels well as having bowel movements passing gas and tolerating a regular diet. She is anxious to go home  Objective: Vital signs in last 24 hours: Temp:  [98 F (36.7 C)-98.5 F (36.9 C)] 98.2 F (36.8 C) (05/03 0627) Pulse Rate:  [65-81] 65 (05/03 0627) Resp:  [17-24] 24 (05/03 0627) BP: (136-137)/(74-83) 136/83 mmHg (05/03 0627) SpO2:  [97 %-100 %] 99 % (05/03 0627) Last BM Date: 09/16/15  Intake/Output from previous day: 05/02 0701 - 05/03 0700 In: 2003.3 [P.O.:800; I.V.:1203.3] Out: 5050 [Urine:5050] Intake/Output this shift: Total I/O In: 348.3 [P.O.:240; I.V.:108.3] Out: 400 [Urine:400]  Physical exam:  Abdomen is soft nondistended nontender wound is clean without erythema or drainage. Nontender  Lab Results: CBC   Recent Labs  09/16/15 0345  WBC 4.7  HGB 10.5*  HCT 30.8*  PLT 140*   BMET No results for input(s): NA, K, CL, CO2, GLUCOSE, BUN, CREATININE, CALCIUM in the last 72 hours. PT/INR No results for input(s): LABPROT, INR in the last 72 hours. ABG No results for input(s): PHART, HCO3 in the last 72 hours.  Invalid input(s): PCO2, PO2  Studies/Results: No results found.  Anti-infectives: Anti-infectives    Start     Dose/Rate Route Frequency Ordered Stop   09/11/15 1608  cefOXItin (MEFOXIN) 2-2.2 GM-% IVPB    Comments:  [place]: cabinet override      09/11/15 1608 09/12/15 0414   09/11/15 1545  cefOXItin (MEFOXIN) 2 g in dextrose 50 mL IVPB (premix)     2 g 100 mL/hr over 30 Minutes Intravenous  Once 09/11/15 1541 09/12/15 0959      Assessment/Plan: s/p Procedure(s): EXPLORATORY LAPAROTOMY SIGMOIDOSCOPY   Patient doing very well will consider discharge is afternoon if she tolerates her regular diet for lunch and she'll follow-up in our office in 10 days  Florene Glen, MD, FACS  09/16/2015

## 2015-09-16 NOTE — NC FL2 (Signed)
[place] LEVEL OF CARE SCREENING TOOL     IDENTIFICATION  Patient [name] Birthdate: [birth year] Sex: female Admission Date (Current Location): 09/11/2015  [place] and [place] Number:  Engineering geologist and Address:  [facility], [address] [place], [place] [address]      Provider Number: [id number]  Attending Physician Name and Address:  Florene Glen, MD  Relative Name and Phone Number:       Current Level of Care: Hospital Recommended Level of Care: [facility] Prior Approval Number:    Date Approved/Denied:   PASRR Number:    Discharge Plan:  (group home)    Current Diagnoses: Patient Active Problem List   Diagnosis Date Noted  . Foreign body in colon   . Foreign body alimentary tract   . Foreign body ingestion 07/31/2015  . Schizoaffective disorder, bipolar type ([place]) 11/06/2014  . Tobacco use disorder 09/30/2014  . Hypothyroidism 09/29/2014  . Hypertension 09/29/2014  . Constipation 09/29/2014    Orientation RESPIRATION BLADDER Height & Weight     Self, Time, Situation, Place  Normal Continent Weight: 190 lb (86.183 kg) Height:  5\' 6"  (167.6 cm)  BEHAVIORAL SYMPTOMS/MOOD NEUROLOGICAL BOWEL NUTRITION STATUS   (none)  (none) Continent Diet  AMBULATORY STATUS COMMUNICATION OF NEEDS Skin   Independent Verbally Surgical wounds                       Personal Care Assistance Level of Assistance   (none) Bathing Assistance: Independent Feeding assistance: Independent Dressing Assistance: Independent Total Care Assistance: Independent   Functional Limitations Info    Sight Info: Adequate Hearing Info: Adequate Speech Info: Adequate    SPECIAL CARE FACTORS FREQUENCY                       Contractures Contractures Info: Not present    Additional Factors Info                  Medication List    STOP taking these medications       alum & mag  hydroxide-simeth 200-200-20 MG/5ML suspension  Commonly known as: MAALOX/MYLANTA     ibuprofen 800 MG tablet  Commonly known as: ADVIL,MOTRIN     lactulose 10 GM/15ML solution  Commonly known as: CHRONULAC      TAKE these medications       acetaminophen 325 MG tablet  Commonly known as: TYLENOL  Take 2 tablets (650 mg total) by mouth every 6 (six) hours as needed for mild pain.     amantadine 100 MG capsule  Commonly known as: SYMMETREL  Take 1 capsule (100 mg total) by mouth 2 (two) times daily.     amLODipine 2.5 MG tablet  Commonly known as: NORVASC  Take 1 tablet (2.5 mg total) by mouth daily.     benztropine 1 MG tablet  Commonly known as: COGENTIN  Take 1 tablet (1 mg total) by mouth at bedtime.     docusate sodium 100 MG capsule  Commonly known as: COLACE  Take 2 capsules (200 mg total) by mouth 2 (two) times daily.     fluPHENAZine 5 MG tablet  Commonly known as: PROLIXIN  Take 1 tablet (5 mg total) by mouth 3 (three) times daily.     levothyroxine 75 MCG tablet  Commonly known as: SYNTHROID, LEVOTHROID  Take 1 tablet (75 mcg total) by mouth daily before breakfast.  Lurasidone HCl 120 MG Tabs  Take 1 tablet (120 mg total) by mouth daily with supper.     OXcarbazepine 150 MG tablet  Commonly known as: TRILEPTAL  Take 1 tablet (150 mg total) by mouth 2 (two) times daily.     oxyCODONE-acetaminophen 5-325 MG tablet  Commonly known as: PERCOCET/ROXICET  Take 1 tablet by mouth every 4 (four) hours as needed for moderate pain.     polyethylene glycol packet  Commonly known as: MIRALAX / GLYCOLAX  Take 17 g by mouth daily.     senna 8.6 MG Tabs tablet  Commonly known as: SENOKOT  Take 2 tablets (17.2 mg total) by mouth at bedtime.     traZODone 50 MG tablet  Commonly known as: DESYREL  Take 1 tablet (50 mg total) by mouth at bedtime.     venlafaxine XR 150  MG 24 hr capsule  Commonly known as: EFFEXOR-XR  Take 1 capsule (150 mg total) by mouth daily with breakfast.              Discharge Medications: Please see discharge summary for a list of discharge medications.  Relevant Imaging Results:  Relevant Lab Results:   Additional Information    Shela Leff, LCSW

## 2015-09-16 NOTE — Discharge Summary (Signed)
Physician Discharge Summary  Patient ID: Melanie Bell MRN: [name] DOB/AGE: 26/30/1992 26 y.o.  Admit date: 09/11/2015 Discharge date: 09/16/2015   Discharge Diagnoses:  Active Problems:   Foreign body ingestion   Procedures:Colonoscopy and attempted retrieval. Exploratory laparotomy and colotomy with retrieval of 3 foreign bodies.  Hospital Course: The patient who purposely ingested 3 foreign bodies. One was a triangular-shaped earring one was a AA battery and one was a drywall screw. She was moved the hospital for observation. She has done this in the past and has required procedures in the past. She was observed and ultimately passage never occurred so she was transferred to behavioral health and then Dr. Jonathon Resides performed an endoscopy and was not able to get to the foreign bodies via the colonoscopy Route. The cut she was bowel prepped him because this had failed to pass over 6 weeks timeframe she was taken to the operating room where exploratory laparotomy was performed the foreign bodies were found grouped in the hepatic flexure of the transverse colon there were milked back into the midportion of the transverse colon where a colotomy was performed all 3 foreign bodies were retrieved without difficulty. The colotomy was closed. She made a non-, K postoperative recovery is discharged in stable condition to a group home to follow-up in our office in 10 days for staple removal. She is given oral analgesics for pain and multiple medications are resumed.  Consults: Internal medicine GI psychiatry and surgery  Disposition: 01-Home or Self Care     Medication List    STOP taking these medications        alum & mag hydroxide-simeth 200-200-20 MG/5ML suspension  Commonly known as:  MAALOX/MYLANTA     ibuprofen 800 MG tablet  Commonly known as:  ADVIL,MOTRIN     lactulose 10 GM/15ML solution  Commonly known as:  CHRONULAC      TAKE these medications        acetaminophen 325  MG tablet  Commonly known as:  TYLENOL  Take 2 tablets (650 mg total) by mouth every 6 (six) hours as needed for mild pain.     amantadine 100 MG capsule  Commonly known as:  SYMMETREL  Take 1 capsule (100 mg total) by mouth 2 (two) times daily.     amLODipine 2.5 MG tablet  Commonly known as:  NORVASC  Take 1 tablet (2.5 mg total) by mouth daily.     benztropine 1 MG tablet  Commonly known as:  COGENTIN  Take 1 tablet (1 mg total) by mouth at bedtime.     docusate sodium 100 MG capsule  Commonly known as:  COLACE  Take 2 capsules (200 mg total) by mouth 2 (two) times daily.     fluPHENAZine 5 MG tablet  Commonly known as:  PROLIXIN  Take 1 tablet (5 mg total) by mouth 3 (three) times daily.     levothyroxine 75 MCG tablet  Commonly known as:  SYNTHROID, LEVOTHROID  Take 1 tablet (75 mcg total) by mouth daily before breakfast.     Lurasidone HCl 120 MG Tabs  Take 1 tablet (120 mg total) by mouth daily with supper.     OXcarbazepine 150 MG tablet  Commonly known as:  TRILEPTAL  Take 1 tablet (150 mg total) by mouth 2 (two) times daily.     oxyCODONE-acetaminophen 5-325 MG tablet  Commonly known as:  PERCOCET/ROXICET  Take 1 tablet by mouth every 4 (four) hours as needed for moderate pain.  polyethylene glycol packet  Commonly known as:  MIRALAX / GLYCOLAX  Take 17 g by mouth daily.     senna 8.6 MG Tabs tablet  Commonly known as:  SENOKOT  Take 2 tablets (17.2 mg total) by mouth at bedtime.     traZODone 50 MG tablet  Commonly known as:  DESYREL  Take 1 tablet (50 mg total) by mouth at bedtime.     venlafaxine XR 150 MG 24 hr capsule  Commonly known as:  EFFEXOR-XR  Take 1 capsule (150 mg total) by mouth daily with breakfast.         Florene Glen, MD, FACS

## 2015-09-30 ENCOUNTER — Ambulatory Visit (INDEPENDENT_AMBULATORY_CARE_PROVIDER_SITE_OTHER): Payer: Medicaid Other | Admitting: Surgery

## 2015-09-30 ENCOUNTER — Encounter: Payer: Self-pay | Admitting: Surgery

## 2015-09-30 VITALS — BP 126/80 | HR 97 | Temp 98.1°F | Ht 66.0 in | Wt 188.4 lb

## 2015-09-30 DIAGNOSIS — Z09 Encounter for follow-up examination after completed treatment for conditions other than malignant neoplasm: Secondary | ICD-10-CM

## 2015-09-30 NOTE — Progress Notes (Signed)
S/p colotomy and extraction FB 4/29 by Dr. Burt Knack Doing well, + BM, + PO, minimal pain  PE NAD Abd: soft, NT incision c/d/i, staples removed  A/P doing well RTC prn  No heavy lifting les 20 lbs 4 more weeks

## 2015-09-30 NOTE — Patient Instructions (Signed)
Please call our office if you have questions or concerns.   

## 2015-11-02 ENCOUNTER — Emergency Department
Admission: EM | Admit: 2015-11-02 | Discharge: 2015-11-02 | Disposition: A | Discharge status: Home / Self Care | Payer: Medicaid Other | Attending: Emergency Medicine | Admitting: Emergency Medicine

## 2015-11-02 ENCOUNTER — Encounter: Payer: Self-pay | Admitting: Emergency Medicine

## 2015-11-02 DIAGNOSIS — F17210 Nicotine dependence, cigarettes, uncomplicated: Secondary | ICD-10-CM | POA: Diagnosis not present

## 2015-11-02 DIAGNOSIS — E785 Hyperlipidemia, unspecified: Secondary | ICD-10-CM | POA: Diagnosis not present

## 2015-11-02 DIAGNOSIS — F250 Schizoaffective disorder, bipolar type: Secondary | ICD-10-CM | POA: Diagnosis not present

## 2015-11-02 DIAGNOSIS — I10 Essential (primary) hypertension: Secondary | ICD-10-CM | POA: Diagnosis not present

## 2015-11-02 DIAGNOSIS — F329 Major depressive disorder, single episode, unspecified: Secondary | ICD-10-CM | POA: Diagnosis not present

## 2015-11-02 DIAGNOSIS — R45851 Suicidal ideations: Secondary | ICD-10-CM | POA: Diagnosis not present

## 2015-11-02 DIAGNOSIS — R443 Hallucinations, unspecified: Secondary | ICD-10-CM | POA: Insufficient documentation

## 2015-11-02 LAB — COMPREHENSIVE METABOLIC PANEL
ALBUMIN: 4.1 g/dL (ref 3.5–5.0)
AST: 15 U/L (ref 15–41)
Alkaline Phosphatase: 73 U/L (ref 38–126)
Anion gap: 6 (ref 5–15)
BUN: 9 mg/dL (ref 6–20)
CHLORIDE: 111 mmol/L (ref 101–111)
CO2: 22 mmol/L (ref 22–32)
CREATININE: 0.71 mg/dL (ref 0.44–1.00)
Calcium: 9.3 mg/dL (ref 8.9–10.3)
GFR calc Af Amer: >60 mL/min (ref >60)
GLUCOSE: 91 mg/dL (ref 65–99)
Potassium: 3.6 mmol/L (ref 3.5–5.1)
Sodium: 139 mmol/L (ref 135–145)
Total Bilirubin: 0.5 mg/dL (ref 0.3–1.2)
Total Protein: 7.1 g/dL (ref 6.5–8.1)

## 2015-11-02 LAB — CBC
HCT: 37 % (ref 35.0–47.0)
Hemoglobin: 12.4 g/dL (ref 12.0–16.0)
MCH: 30.4 pg (ref 26.0–34.0)
MCHC: 33.5 g/dL (ref 32.0–36.0)
MCV: 90.9 fL (ref 80.0–100.0)
PLATELETS: 192 10*3/uL (ref 150–440)
RBC: 4.07 MIL/uL (ref 3.80–5.20)
RDW: 15 % — AB (ref 11.5–14.5)
WBC: 6.3 10*3/uL (ref 3.6–11.0)

## 2015-11-02 LAB — URINALYSIS COMPLETE WITH MICROSCOPIC (ARMC ONLY)
BILIRUBIN URINE: NEGATIVE
Bacteria, UA: NONE SEEN
Glucose, UA: NEGATIVE mg/dL
Hgb urine dipstick: NEGATIVE
KETONES UR: NEGATIVE mg/dL
Leukocytes, UA: NEGATIVE
NITRITE: NEGATIVE
PH: 7 (ref 5.0–8.0)
Protein, ur: NEGATIVE mg/dL
Specific Gravity, Urine: 1.015 (ref 1.005–1.030)

## 2015-11-02 LAB — ACETAMINOPHEN LEVEL

## 2015-11-02 LAB — URINE DRUG SCREEN, QUALITATIVE (ARMC ONLY)
AMPHETAMINES, UR SCREEN: NOT DETECTED
BENZODIAZEPINE, UR SCRN: NOT DETECTED
Barbiturates, Ur Screen: NOT DETECTED
Cannabinoid 50 Ng, Ur ~~LOC~~: NOT DETECTED
Cocaine Metabolite,Ur ~~LOC~~: NOT DETECTED
MDMA (Ecstasy)Ur Screen: NOT DETECTED
METHADONE SCREEN, URINE: NOT DETECTED
Opiate, Ur Screen: NOT DETECTED
PHENCYCLIDINE (PCP) UR S: NOT DETECTED
TRICYCLIC, UR SCREEN: NOT DETECTED

## 2015-11-02 LAB — PREGNANCY, URINE: Preg Test, Ur: NEGATIVE

## 2015-11-02 LAB — ETHANOL

## 2015-11-02 LAB — SALICYLATE LEVEL

## 2015-11-02 MED ORDER — FLUPHENAZINE HCL 5 MG PO TABS: 5.0000 mg | ORAL_TABLET | Freq: Four times a day (QID) | ORAL | Status: DC

## 2015-11-02 NOTE — ED Provider Notes (Signed)
[facility] Emergency Department Provider Note   ____________________________________________  Time seen: Approximately 230 PM  I have reviewed the triage vital signs and the nursing notes.   HISTORY  Chief Complaint Suicidal   HPI Melanie Bell is a 25 y.o. female with a history of hallucinations as well as depression was present emergency department today with command hallucinations telling her to kill herself. She has not attempted any suicidal gestures prior to arrival. Denies any harmful ingestions or self-destructive behavior. Says that she is taking her medicines and does not note any particular issue such as a stressful event that could've instigated the suicidal thoughts.   Past Medical History  Diagnosis Date  . Hallucinations 09/30/2014    Sizoaffective  . Depression   . Anxiety   . Hypertension   . Tardive dyskinesia 10/2014    recent onset  . GERD (gastroesophageal reflux disease)   . Hyperlipidemia     Patient Active Problem List   Diagnosis Date Noted  . Foreign body in colon   . Foreign body alimentary tract   . Foreign body ingestion 07/31/2015  . Schizoaffective disorder, bipolar type ([place]) 11/06/2014  . Tobacco use disorder 09/30/2014  . Hypothyroidism 09/29/2014  . Hypertension 09/29/2014  . Constipation 09/29/2014    Past Surgical History  Procedure Laterality Date  . Wisdom tooth extraction    . Esophagogastroduodenoscopy N/A 11/28/2014    Procedure: ESOPHAGOGASTRODUODENOSCOPY (EGD);  Surgeon: [name], MD;  Location: [facility] ENDOSCOPY;  Service: Endoscopy;  Laterality: N/A;  . Abdominal surgery      "years ago" to remove foreign objects  . Breast lumpectomy Right   . Appendectomy    . Colonoscopy with propofol N/A 09/10/2015    Procedure: COLONOSCOPY WITH PROPOFOL;  Surgeon: [name], MD;  Location: [facility] ENDOSCOPY;  Service: Endoscopy;  Laterality: N/A;  . Laparotomy N/A 09/12/2015    Procedure:  EXPLORATORY LAPAROTOMY;  Surgeon: Florene Glen, MD;  Location: [facility] ORS;  Service: General;  Laterality: N/A;  . Sigmoidoscopy N/A 09/12/2015    Procedure: [place] Face;  Surgeon: Florene Glen, MD;  Location: [facility] ORS;  Service: General;  Laterality: N/A;    Current Outpatient Rx  Name  Route  Sig  Dispense  Refill  . acetaminophen (TYLENOL) 325 MG tablet   Oral   Take 2 tablets (650 mg total) by mouth every 6 (six) hours as needed for mild pain.   30 tablet   0   . amantadine (SYMMETREL) 100 MG capsule   Oral   Take 1 capsule (100 mg total) by mouth 2 (two) times daily.   60 capsule   0   . amLODipine (NORVASC) 2.5 MG tablet   Oral   Take 1 tablet (2.5 mg total) by mouth daily.   30 tablet   0   . benztropine (COGENTIN) 1 MG tablet   Oral   Take 1 tablet (1 mg total) by mouth at bedtime.   30 tablet   0   . docusate sodium (COLACE) 100 MG capsule   Oral   Take 2 capsules (200 mg total) by mouth 2 (two) times daily.   120 capsule   0   . fluPHENAZine (PROLIXIN) 5 MG tablet   Oral   Take 1 tablet (5 mg total) by mouth 3 (three) times daily.   90 tablet   0   . levothyroxine (SYNTHROID, LEVOTHROID) 75 MCG tablet   Oral   Take 1 tablet (75 mcg  total) by mouth daily before breakfast.   30 tablet   0   . Lurasidone HCl 120 MG TABS   Oral   Take 1 tablet (120 mg total) by mouth daily with supper.   30 tablet   0   . OXcarbazepine (TRILEPTAL) 150 MG tablet   Oral   Take 1 tablet (150 mg total) by mouth 2 (two) times daily.   60 tablet   0   . oxyCODONE-acetaminophen (PERCOCET/ROXICET) 5-325 MG tablet   Oral   Take 1 tablet by mouth every 4 (four) hours as needed for moderate pain.   30 tablet   0   . polyethylene glycol (MIRALAX / GLYCOLAX) packet   Oral   Take 17 g by mouth daily.   30 each   0   . senna (SENOKOT) 8.6 MG TABS tablet   Oral   Take 2 tablets (17.2 mg total) by mouth at bedtime.   60 each   0   . traZODone (DESYREL) 50 MG  tablet   Oral   Take 1 tablet (50 mg total) by mouth at bedtime.   30 tablet   0   . venlafaxine XR (EFFEXOR-XR) 150 MG 24 hr capsule   Oral   Take 1 capsule (150 mg total) by mouth daily with breakfast.   30 capsule   0     Allergies Betadine; Shellfish-derived products; and Iodine  Family History  Problem Relation Age of Onset  . Depression Mother   . Hypertension Mother     Social History Social History  Substance Use Topics  . Smoking status: Current Every Day Smoker -- 0.50 packs/day for 3 years    Types: Cigarettes  . Smokeless tobacco: Never Used  . Alcohol Use: No    Review of Systems Constitutional: No fever/chills Eyes: No visual changes. ENT: No sore throat. Cardiovascular: Denies chest pain. Respiratory: Denies shortness of breath. Gastrointestinal: No abdominal pain.  No nausea, no vomiting.  No diarrhea.  No constipation. Genitourinary: Negative for dysuria. Musculoskeletal: Negative for back pain. Skin: Negative for rash. Neurological: Negative for headaches, focal weakness or numbness.  10-point ROS otherwise negative.  ____________________________________________   PHYSICAL EXAM:  VITAL SIGNS: ED Triage Vitals  Enc Vitals Group     BP 11/02/15 1345 134/83 mmHg     Pulse Rate 11/02/15 1345 98     Resp 11/02/15 1345 20     Temp 11/02/15 1345 98.3 F (36.8 C)     Temp Source 11/02/15 1345 Oral     SpO2 11/02/15 1345 97 %     Weight --      Height --      Head Cir --      Peak Flow --      Pain Score --      Pain Loc --      Pain Edu? --      Excl. in [place]? --     Constitutional: Alert and oriented. Well appearing and in no acute distress. Eyes: Conjunctivae are normal. PERRL. EOMI. Head: Atraumatic. Nose: No congestion/rhinnorhea. Mouth/Throat: Mucous membranes are moist.   Neck: No stridor.   Cardiovascular: Normal rate, regular rhythm. Grossly normal heart sounds.   Respiratory: Normal respiratory effort.  No retractions.  Lungs CTAB. Gastrointestinal: Soft and nontender. No distention. No abdominal bruits. No CVA tenderness. Musculoskeletal: No lower extremity tenderness nor edema.  No joint effusions. Neurologic:  Normal speech and language. No gross focal neurologic deficits are appreciated.  Skin:  Skin is warm, dry and intact. No rash noted. Psychiatric: Mood and affect are normal. Speech and behavior are normal.  ____________________________________________   LABS (all labs ordered are listed, but only abnormal results are displayed)  Labs Reviewed  COMPREHENSIVE METABOLIC PANEL - Abnormal; Notable for the following:    ALT <5 (*)    All other components within normal limits  ACETAMINOPHEN LEVEL - Abnormal; Notable for the following:    Acetaminophen (Tylenol), Serum <10 (*)    All other components within normal limits  CBC - Abnormal; Notable for the following:    RDW 15.0 (*)    All other components within normal limits  URINALYSIS COMPLETEWITH MICROSCOPIC ([facility] ONLY) - Abnormal; Notable for the following:    Color, Urine YELLOW (*)    APPearance CLEAR (*)    Squamous Epithelial / LPF 0-5 (*)    All other components within normal limits  ETHANOL  SALICYLATE LEVEL  URINE DRUG SCREEN, QUALITATIVE ([facility] ONLY)  PREGNANCY, URINE   ____________________________________________  EKG   ____________________________________________  RADIOLOGY   ____________________________________________   PROCEDURES ____________________________________________   INITIAL IMPRESSION / ASSESSMENT AND PLAN / ED COURSE  Pertinent labs & imaging results that were available during my care of the patient were reviewed by me and considered in my medical decision making (see chart for details).  Patient aware of need for involuntary commitment. We'll have psychiatry consult. ____________________________________________   FINAL CLINICAL IMPRESSION(S) / ED DIAGNOSES  Hallucinations.  Suicidal  thoughts.    NEW MEDICATIONS STARTED DURING THIS VISIT:  New Prescriptions   No medications on file     Note:  This document was prepared using Dragon voice recognition software and may include unintentional dictation errors.    Orbie Pyo, MD 11/02/15 [phone]

## 2015-11-02 NOTE — ED Notes (Signed)
Cigarettes, lighter, ID and one five dollar bill was put in bag verified by ED Tech and this RN.

## 2015-11-02 NOTE — ED Notes (Signed)
[facility]  PAPERS  RESCINDED  PER  DR  [name] 

## 2015-11-02 NOTE — ED Notes (Signed)

## 2015-11-02 NOTE — ED Notes (Signed)
Dr.[name] at bedside  

## 2015-11-02 NOTE — Consult Note (Signed)
[facility] Consult   Reason for Consult:  Consult for this 25 year old woman with a history of schizoaffective disorder who presents voluntarily to the emergency room Referring Physician:  McLaurin Patient Identification: Melanie Bell MRN:  [id number] Principal Diagnosis: Schizoaffective disorder, bipolar type [facility]) Diagnosis:   Patient Active Problem List   Diagnosis Date Noted  . Foreign body in colon [T18.4XXA]   . Foreign body alimentary tract [T18.9XXA]   . Foreign body ingestion [T18.9XXA] 07/31/2015  . Schizoaffective disorder, bipolar type ([place]) [F25.0] 11/06/2014  . Tobacco use disorder [F17.200] 09/30/2014  . Hypothyroidism [E03.9] 09/29/2014  . Hypertension [I10] 09/29/2014  . Constipation [K59.00] 09/29/2014    Total Time spent with patient: 1 hour  Subjective:   Melanie Bell is a 25 y.o. female patient admitted with "the voices came back".  HPI:  Patient interviewed. Chart reviewed. Labs and vitals reviewed. Case discussed with TTS and emergency room physician. 25 year old woman well known from multiple prior encounters at her self brought to the emergency room today. She says that she woke up about 3:00 in the morning with her auditory hallucinations back. They were the typical voices which tell her she ought to swallow inanimate objects. She did not follow their instructions but instead went back to sleep. She was hoping when she woke up the voices would be gone. Unfortunately they have persisted through the day. She has resisted the voices and has not swallowed anything dangerous. She has been compliant with medicine. Patient absolutely denies having any wish to harm herself or suicidal ideation. She has been compliant with medicines and has not had any change to her medicines. She denies being aware of any stressful situation that could've set this off. Her Payton Mccallum  Social history: Resides in a group home. Her mother is her closest  relative.  Medical history: She has a history of multiple ingestions of foreign bodies which at times of it required surgical or endoscopic removal. Hypothyroidism hypertension.  Substance abuse history: Does not drink does not abuse drugs.  Past Psychiatric History: Diagnosis of schizoaffective disorder with chronic auditory hallucinations and occasional mood swings. She has had multiple hospitalizations usually because of suicidal behavior or ingestion of dangerous objects. She has had lengthy hospitalizations in the hospitalizations in the past. Seems to be doing reasonably well recently on her combination of antipsychotics and antidepressants.  Risk to Self: Suicidal Ideation: No Suicidal Intent: No Is patient at risk for suicide?: No Suicidal Plan?: No Access to Means: No What has been your use of drugs/alcohol within the last 12 months?: Reports of none How many times?: 5 Other Self Harm Risks: Swallowing things Triggers for Past Attempts: Hallucinations Intentional Self Injurious Behavior:  (Swallowing things) Risk to Others: Homicidal Ideation: No Thoughts of Harm to Others: No Current Homicidal Intent: No Current Homicidal Plan: No Access to Homicidal Means: No Identified Victim: Reports of none History of harm to others?: No Assessment of Violence: None Noted Violent Behavior Description: Reports of none Does patient have access to weapons?: No Criminal Charges Pending?: No Does patient have a court date: No Prior Inpatient Therapy: Prior Inpatient Therapy: Yes Prior Therapy Dates: Multiple Admissions Prior Therapy Facilty/Provider(s): [facility] Reason for Treatment: Psychosis Prior Outpatient Therapy: Prior Outpatient Therapy: Yes Prior Therapy Dates: Current Prior Therapy Facilty/Provider(s): [facility] Reason for Treatment: Psychosis Does patient have an ACCT team?: No Does patient have [facility] services? : No Does patient have P4[facility] services?:  No  Past Medical History:  Past Medical History  Diagnosis Date  . Hallucinations 09/30/2014    Sizoaffective  . Depression   . Anxiety   . Hypertension   . Tardive dyskinesia 10/2014    recent onset  . GERD (gastroesophageal reflux disease)   . Hyperlipidemia     Past Surgical History  Procedure Laterality Date  . Wisdom tooth extraction    . Esophagogastroduodenoscopy N/A 11/28/2014    Procedure: ESOPHAGOGASTRODUODENOSCOPY (EGD);  Surgeon: [name], MD;  Location: [facility] ENDOSCOPY;  Service: Endoscopy;  Laterality: N/A;  . Abdominal surgery      "years ago" to remove foreign objects  . Breast lumpectomy Right   . Appendectomy    . Colonoscopy with propofol N/A 09/10/2015    Procedure: COLONOSCOPY WITH PROPOFOL;  Surgeon: [name], MD;  Location: [facility]COPY;  Service: Endoscopy;  Laterality: N/A;  . Laparotomy N/A 09/12/2015    Procedure: EXPLORATORY LAPAROTOMY;  Surgeon: Florene Glen, MD;  Location: [facility] ORS;  Service: General;  Laterality: N/A;  . Sigmoidoscopy N/A 09/12/2015    Procedure: Lonell Face;  Surgeon: Florene Glen, MD;  Location: [facility] ORS;  Service: General;  Laterality: N/A;   Family History:  Family History  Problem Relation Age of Onset  . Depression Mother   . Hypertension Mother    Family Psychiatric  History: Patient denies any family history Social History:  History  Alcohol Use No     History  Drug Use No    Social History   Social History  . Marital Status: Single    Spouse Name: N/A  . Number of Children: N/A  . Years of Education: N/A   Social History Main Topics  . Smoking status: Current Every Day Smoker -- 0.50 packs/day for 3 years    Types: Cigarettes  . Smokeless tobacco: Never Used  . Alcohol Use: No  . Drug Use: No  . Sexual Activity: No   Other Topics Concern  . None   Social History Narrative   Additional Social History:    Allergies:   Allergies  Allergen Reactions  . Betadine [Povidone  Iodine] Other (See Comments)    Reaction:  Unknown   . Shellfish-Derived Products Other (See Comments)    Reaction:  Unknown   . Iodine Rash    Labs:  Results for orders placed or performed during the hospital encounter of 11/02/15 (from the past 48 hour(s))  Comprehensive metabolic panel     Status: Abnormal   Collection Time: 11/02/15  1:50 PM  Result Value Ref Range   Sodium 139 135 - 145 mmol/L   Potassium 3.6 3.5 - 5.1 mmol/L   Chloride 111 101 - 111 mmol/L   CO2 22 22 - 32 mmol/L   Glucose, Bld 91 65 - 99 mg/dL   BUN 9 6 - 20 mg/dL   Creatinine, Ser 0.71 0.44 - 1.00 mg/dL   Calcium 9.3 8.9 - 10.3 mg/dL   Total Protein 7.1 6.5 - 8.1 g/dL   Albumin 4.1 3.5 - 5.0 g/dL   AST 15 15 - 41 U/L   ALT <5 (L) 14 - 54 U/L   Alkaline Phosphatase 73 38 - 126 U/L   Total Bilirubin 0.5 0.3 - 1.2 mg/dL   GFR calc non Af Amer >60 >60 mL/min   GFR calc Af Amer >60 >60 mL/min    Comment: (NOTE) The eGFR has been calculated using the CKD EPI equation. This calculation has not been validated in all clinical situations. eGFR's persistently <  60 mL/min signify possible Chronic Kidney Disease.    Anion gap 6 5 - 15  Ethanol     Status: None   Collection Time: 11/02/15  1:50 PM  Result Value Ref Range   Alcohol, Ethyl (B) <5 <5 mg/dL    Comment:        LOWEST DETECTABLE LIMIT FOR SERUM ALCOHOL IS 5 mg/dL FOR MEDICAL PURPOSES ONLY   Salicylate level     Status: None   Collection Time: 11/02/15  1:50 PM  Result Value Ref Range   Salicylate Lvl <3.2 2.8 - 30.0 mg/dL  Acetaminophen level     Status: Abnormal   Collection Time: 11/02/15  1:50 PM  Result Value Ref Range   Acetaminophen (Tylenol), Serum <10 (L) 10 - 30 ug/mL    Comment:        THERAPEUTIC CONCENTRATIONS VARY SIGNIFICANTLY. A RANGE OF 10-30 ug/mL MAY BE AN EFFECTIVE CONCENTRATION FOR MANY PATIENTS. HOWEVER, SOME ARE BEST TREATED AT CONCENTRATIONS OUTSIDE THIS RANGE. ACETAMINOPHEN CONCENTRATIONS >150 ug/mL AT 4 HOURS  AFTER INGESTION AND >50 ug/mL AT 12 HOURS AFTER INGESTION ARE OFTEN ASSOCIATED WITH TOXIC REACTIONS.   cbc     Status: Abnormal   Collection Time: 11/02/15  1:50 PM  Result Value Ref Range   WBC 6.3 3.6 - 11.0 K/uL   RBC 4.07 3.80 - 5.20 MIL/uL   Hemoglobin 12.4 12.0 - 16.0 g/dL   HCT 37.0 35.0 - 47.0 %   MCV 90.9 80.0 - 100.0 fL   MCH 30.4 26.0 - 34.0 pg   MCHC 33.5 32.0 - 36.0 g/dL   RDW 15.0 (H) 11.5 - 14.5 %   Platelets 192 150 - 440 K/uL  Urine Drug Screen, Qualitative     Status: None   Collection Time: 11/02/15  1:52 PM  Result Value Ref Range   Tricyclic, Ur Screen NONE DETECTED NONE DETECTED   Amphetamines, Ur Screen NONE DETECTED NONE DETECTED   MDMA (Ecstasy)Ur Screen NONE DETECTED NONE DETECTED   Cocaine Metabolite,Ur Interior NONE DETECTED NONE DETECTED   Opiate, Ur Screen NONE DETECTED NONE DETECTED   Phencyclidine (PCP) Ur S NONE DETECTED NONE DETECTED   Cannabinoid 50 Ng, Ur [place] NONE DETECTED NONE DETECTED   Barbiturates, Ur Screen NONE DETECTED NONE DETECTED   Benzodiazepine, Ur Scrn NONE DETECTED NONE DETECTED   Methadone Scn, Ur NONE DETECTED NONE DETECTED    Comment: (NOTE) 549  Tricyclics, urine               Cutoff 1000 ng/mL 200  Amphetamines, urine             Cutoff 1000 ng/mL 300  MDMA (Ecstasy), urine           Cutoff 500 ng/mL 400  Cocaine Metabolite, urine       Cutoff 300 ng/mL 500  Opiate, urine                   Cutoff 300 ng/mL 600  Phencyclidine (PCP), urine      Cutoff 25 ng/mL 700  Cannabinoid, urine              Cutoff 50 ng/mL 800  Barbiturates, urine             Cutoff 200 ng/mL 900  Benzodiazepine, urine           Cutoff 200 ng/mL 1000 Methadone, urine                Cutoff 300 ng/mL  1100 1200 The urine drug screen provides only a preliminary, unconfirmed 1300 analytical test result and should not be used for non-medical 1400 purposes. Clinical consideration and professional judgment should 1500 be applied to any positive drug screen  result due to possible 1600 interfering substances. A more specific alternate chemical method 1700 must be used in order to obtain a confirmed analytical result.  1800 Gas chromato graphy / mass spectrometry (GC/MS) is the preferred 1900 confirmatory method.   Urinalysis complete, with microscopic ([facility] only)     Status: Abnormal   Collection Time: 11/02/15  1:52 PM  Result Value Ref Range   Color, Urine YELLOW (A) YELLOW   APPearance CLEAR (A) CLEAR   Glucose, UA NEGATIVE NEGATIVE mg/dL   Bilirubin Urine NEGATIVE NEGATIVE   Ketones, ur NEGATIVE NEGATIVE mg/dL   Specific Gravity, Urine 1.015 1.005 - 1.030   Hgb urine dipstick NEGATIVE NEGATIVE   pH 7.0 5.0 - 8.0   Protein, ur NEGATIVE NEGATIVE mg/dL   Nitrite NEGATIVE NEGATIVE   Leukocytes, UA NEGATIVE NEGATIVE   RBC / HPF 0-5 0 - 5 RBC/hpf   WBC, UA 0-5 0 - 5 WBC/hpf   Bacteria, UA NONE SEEN NONE SEEN   Squamous Epithelial / LPF 0-5 (A) NONE SEEN   Mucous PRESENT   Pregnancy, urine     Status: None   Collection Time: 11/02/15  1:52 PM  Result Value Ref Range   Preg Test, Ur NEGATIVE NEGATIVE    No current facility-administered medications for this encounter.   Current Outpatient Prescriptions  Medication Sig Dispense Refill  . acetaminophen (TYLENOL) 325 MG tablet Take 2 tablets (650 mg total) by mouth every 6 (six) hours as needed for mild pain. 30 tablet 0  . amantadine (SYMMETREL) 100 MG capsule Take 1 capsule (100 mg total) by mouth 2 (two) times daily. 60 capsule 0  . amLODipine (NORVASC) 2.5 MG tablet Take 1 tablet (2.5 mg total) by mouth daily. 30 tablet 0  . benztropine (COGENTIN) 1 MG tablet Take 1 tablet (1 mg total) by mouth at bedtime. 30 tablet 0  . docusate sodium (COLACE) 100 MG capsule Take 2 capsules (200 mg total) by mouth 2 (two) times daily. 120 capsule 0  . fluPHENAZine (PROLIXIN) 5 MG tablet Take 1 tablet (5 mg total) by mouth 3 (three) times daily. 90 tablet 0  . fluPHENAZine (PROLIXIN) 5 MG tablet  Take 1 tablet (5 mg total) by mouth 4 (four) times daily. Dose as 88m in AM. 549mmidday and 1079mt night for a total of 5m68mday. This REPLACES her prior 5mg 54m dose 120 tablet 1  . levothyroxine (SYNTHROID, LEVOTHROID) 75 MCG tablet Take 1 tablet (75 mcg total) by mouth daily before breakfast. 30 tablet 0  . Lurasidone HCl 120 MG TABS Take 1 tablet (120 mg total) by mouth daily with supper. 30 tablet 0  . OXcarbazepine (TRILEPTAL) 150 MG tablet Take 1 tablet (150 mg total) by mouth 2 (two) times daily. 60 tablet 0  . oxyCODONE-acetaminophen (PERCOCET/ROXICET) 5-325 MG tablet Take 1 tablet by mouth every 4 (four) hours as needed for moderate pain. 30 tablet 0  . polyethylene glycol (MIRALAX / GLYCOLAX) packet Take 17 g by mouth daily. 30 each 0  . senna (SENOKOT) 8.6 MG TABS tablet Take 2 tablets (17.2 mg total) by mouth at bedtime. 60 each 0  . traZODone (DESYREL) 50 MG tablet Take 1 tablet (50 mg total) by mouth at bedtime. 30 tablet 0  .  venlafaxine XR (EFFEXOR-XR) 150 MG 24 hr capsule Take 1 capsule (150 mg total) by mouth daily with breakfast. 30 capsule 0    Musculoskeletal: Strength & Muscle Tone: within normal limits Gait & Station: normal Patient leans: N/A  Psychiatric Specialty Exam: Physical Exam  Nursing note and vitals reviewed. Constitutional: She appears well-developed and well-nourished.  HENT:  Head: Normocephalic and atraumatic.  Eyes: Conjunctivae are normal. Pupils are equal, round, and reactive to light.  Neck: Normal range of motion.  Cardiovascular: Regular rhythm and normal heart sounds.   Respiratory: Effort normal. No respiratory distress.  GI: Soft.  Musculoskeletal: Normal range of motion.  Neurological: She is alert.  Skin: Skin is warm and dry.  Psychiatric: Judgment normal. Her affect is blunt. Her speech is delayed. She is slowed and actively hallucinating. She is not agitated. Cognition and memory are normal. She expresses no suicidal ideation. She  is attentive.    Review of Systems  Constitutional: Negative.   HENT: Negative.   Eyes: Negative.   Respiratory: Negative.   Cardiovascular: Negative.   Gastrointestinal: Negative.   Musculoskeletal: Negative.   Skin: Negative.   Neurological: Negative.   Psychiatric/Behavioral: Positive for hallucinations. Negative for depression, suicidal ideas, memory loss and substance abuse. The patient is not nervous/anxious and does not have insomnia.     Blood pressure 134/83, pulse 98, temperature 98.3 F (36.8 C), temperature source Oral, resp. rate 20, SpO2 97 %.There is no weight on file to calculate BMI.  General Appearance: Fairly Groomed  Eye Contact:  Good  Speech:  Slow  Volume:  Normal  Mood:  Euthymic  Affect:  Constricted  Thought Process:  Goal Directed  Orientation:  Full (Time, Place, and Person)  Thought Content:  Logical  Suicidal Thoughts:  No  Homicidal Thoughts:  No  Memory:  Immediate;   Good Recent;   Fair Remote;   Fair  Judgement:  Fair  Insight:  Fair  Psychomotor Activity:  Normal  Concentration:  Concentration: Fair  Recall:  [employer] of Knowledge:  Fair  Language:  Fair  Akathisia:  No  Handed:  Right  AIMS (if indicated):     Assets:  Communication Skills Desire for Improvement Housing Resilience Social Support  ADL's:  Intact  Cognition:  WNL  Sleep:        Treatment Plan Summary: Daily contact with patient to assess and evaluate symptoms and progress in treatment, Medication management and Plan Patient will continue plan as noted  Disposition: Daily contact with patient to assess and evaluate symptoms and progress in treatment, Medication management and Plan Patient has not actually done anything to hurt her self and is articulating that she does not want to herself. I think she is being genuine about her symptoms. I think she is making an effort to try and function better. I don't think that it would be necessarily helpful to admit her to  the psychiatric hospital. She certainly is at chronic risk of self injury again but I don't think short-term hospitalization is going to prevent that. Instead I have negotiated with her a plan that we try increasing her Prolixin. I propose increasing her Prolixin from 5 mg 3 times a day to a total of 20 mg a day divided as 55 and 10 mg doses. Patient is agreeable. I ran this by Dr. Robet Leu who is agreeable. Prescription printed with the directions out. IVC discontinued. Patient can be discharged back to her group home and has regular outpatient mental  health follow-up.  Alethia Berthold, MD 11/02/2015 5:47 PM

## 2015-11-02 NOTE — BH Assessment (Signed)
Assessment Note  Melanie Bell is an 25 y.o. female who presents to the ER due to having A/H with commands to hurt herself. Patient states, the voices worsened overnight, at approximately 3:30am. Patient have history of swallowing things and it result in her having to have surgery to remove them. Patient has had multiple inpatient admissions with [facility], for similar presentation.  Patient is currently denying SI/HI. She endorse A/H.  She have no history of aggression or violence.   Diagnosis: Psychosis  Past Medical History:  Past Medical History  Diagnosis Date  . Hallucinations 09/30/2014    Sizoaffective  . Depression   . Anxiety   . Hypertension   . Tardive dyskinesia 10/2014    recent onset  . GERD (gastroesophageal reflux disease)   . Hyperlipidemia     Past Surgical History  Procedure Laterality Date  . Wisdom tooth extraction    . Esophagogastroduodenoscopy N/A 11/28/2014    Procedure: ESOPHAGOGASTRODUODENOSCOPY (EGD);  Surgeon: [name], MD;  Location: [facility] ENDOSCOPY;  Service: Endoscopy;  Laterality: N/A;  . Abdominal surgery      "years ago" to remove foreign objects  . Breast lumpectomy Right   . Appendectomy    . Colonoscopy with propofol N/A 09/10/2015    Procedure: COLONOSCOPY WITH PROPOFOL;  Surgeon: [name], MD;  Location: [facility] ENDOSCOPY;  Service: Endoscopy;  Laterality: N/A;  . Laparotomy N/A 09/12/2015    Procedure: EXPLORATORY LAPAROTOMY;  Surgeon: Florene Glen, MD;  Location: [facility] ORS;  Service: General;  Laterality: N/A;  . Sigmoidoscopy N/A 09/12/2015    Procedure: Lonell Face;  Surgeon: Florene Glen, MD;  Location: [facility] ORS;  Service: General;  Laterality: N/A;    Family History:  Family History  Problem Relation Age of Onset  . Depression Mother   . Hypertension Mother     Social History:  reports that she has been smoking Cigarettes.  She has a 1.5 pack-year smoking history. She has never used smokeless tobacco.  She reports that she does not drink alcohol or use illicit drugs.  Additional Social History:  Alcohol / Drug Use Pain Medications: See PTA Prescriptions: See PTA Over the Counter: See PTA History of alcohol / drug use?: No history of alcohol / drug abuse Longest period of sobriety (when/how long): No history of use Negative Consequences of Use:  (No history of use) Withdrawal Symptoms:  (No history of use)  CIWA: CIWA-Ar BP: 134/83 mmHg Pulse Rate: 98 COWS:    Allergies:  Allergies  Allergen Reactions  . Betadine [Povidone Iodine] Other (See Comments)    Reaction:  Unknown   . Shellfish-Derived Products Other (See Comments)    Reaction:  Unknown   . Iodine Rash    Home Medications:  (Not in a hospital admission)  OB/GYN Status:  No LMP recorded. Patient has had an injection.  General Assessment Data Location of Assessment: [facility] ED TTS Assessment: In system Is this a Tele or Face-to-Face Assessment?: Face-to-Face Is this an Initial Assessment or a Re-assessment for this encounter?: Initial Assessment Marital status: Single Maiden name: n/a Is patient pregnant?: No Pregnancy Status: No Living Arrangements:  (Group Home) Can pt return to current living arrangement?: Yes Admission Status: Involuntary Is patient capable of signing voluntary admission?: Yes Referral Source: Self/Family/Friend Insurance type: Medicaid  Medical Screening Exam ([place]) Medical Exam completed: Yes  Crisis Care Plan Living Arrangements:  (Group Home) Legal Guardian: Mother Name of Psychiatrist: Dr. Dorann Ou, [place] Name of Therapist: [facility]  Education Status Is patient currently in school?: No Current Grade: n/a Highest grade of school patient has completed: Unknown Name of school: n/a Contact person: n/a  Risk to self with the past 6 months Suicidal Ideation: No Has patient been a risk to self within the past 6 months prior to admission? : No Suicidal Intent: No Has patient  had any suicidal intent within the past 6 months prior to admission? : No Is patient at risk for suicide?: No Suicidal Plan?: No Has patient had any suicidal plan within the past 6 months prior to admission? : No Access to Means: No What has been your use of drugs/alcohol within the last 12 months?: Reports of none Previous Attempts/Gestures: Yes How many times?: 5 Other Self Harm Risks: Swallowing things Triggers for Past Attempts: Hallucinations Intentional Self Injurious Behavior:  (Swallowing things) Family Suicide History: No Recent stressful life event(s): Other (Comment) Persecutory voices/beliefs?: Yes Depression: Yes Depression Symptoms: Feeling angry/irritable, Feeling worthless/self pity, Loss of interest in usual pleasures, Guilt, Fatigue, Isolating, Tearfulness Substance abuse history and/or treatment for substance abuse?: Yes Suicide prevention information given to non-admitted patients: Not applicable  Risk to Others within the past 6 months Homicidal Ideation: No Does patient have any lifetime risk of violence toward others beyond the six months prior to admission? : No Thoughts of Harm to Others: No Current Homicidal Intent: No Current Homicidal Plan: No Access to Homicidal Means: No Identified Victim: Reports of none History of harm to others?: No Assessment of Violence: None Noted Violent Behavior Description: Reports of none Does patient have access to weapons?: No Criminal Charges Pending?: No Does patient have a court date: No Is patient on probation?: No  Psychosis Hallucinations: Auditory Delusions: None noted  Mental Status Report Appearance/Hygiene: In scrubs, Unremarkable, In hospital gown Eye Contact: Fair Motor Activity: Unremarkable, Freedom of movement Speech: Logical/coherent Level of Consciousness: Alert Mood: Depressed, Anxious, Sad, Pleasant Affect: Appropriate to circumstance, Anxious Anxiety Level: Minimal Thought Processes:  Coherent, Relevant Judgement: Unimpaired Orientation: Person, Place, Time, Situation, Appropriate for developmental age Obsessive Compulsive Thoughts/Behaviors: Minimal  Cognitive Functioning Concentration: Normal Memory: Recent Intact, Remote Intact IQ: Average Insight: Fair Impulse Control: Poor Appetite: Fair Weight Loss: 0 Weight Gain: 0 Sleep: No Change Total Hours of Sleep: 8 Vegetative Symptoms: None  ADLScreening [facility] Assessment Services) Patient's cognitive ability adequate to safely complete daily activities?: Yes Patient able to express need for assistance with ADLs?: Yes Independently performs ADLs?: Yes (appropriate for developmental age)  Prior Inpatient Therapy Prior Inpatient Therapy: Yes Prior Therapy Dates: Multiple Admissions Prior Therapy Facilty/Provider(s): [place], [facility] Reason for Treatment: Psychosis  Prior Outpatient Therapy Prior Outpatient Therapy: Yes Prior Therapy Dates: Current Prior Therapy Facilty/Provider(s): [place] Reason for Treatment: Psychosis Does patient have an ACCT team?: No Does patient have [facility] services? : No Does patient have P4CC services?: No  ADL Screening (condition at time of admission) Patient's cognitive ability adequate to safely complete daily activities?: Yes Is the patient deaf or have difficulty hearing?: No Does the patient have difficulty seeing, even when wearing glasses/contacts?: No Does the patient have difficulty concentrating, remembering, or making decisions?: No Patient able to express need for assistance with ADLs?: Yes Does the patient have difficulty dressing or bathing?: No Independently performs ADLs?: Yes (appropriate for developmental age) Does the patient have difficulty walking or climbing stairs?: No Weakness of Legs: None Weakness of Arms/Hands: None  Home Assistive Devices/Equipment Home Assistive Devices/Equipment: None  Therapy Consults (therapy consults require a  physician order) PT Evaluation Needed: No OT Evalulation Needed: No SLP Evaluation Needed: No Abuse/Neglect Assessment (Assessment to be complete while patient is alone) Physical Abuse: Denies Verbal Abuse: Denies Sexual Abuse: Denies Exploitation of patient/patient's resources: Denies Self-Neglect: Denies Values / Beliefs Cultural Requests During Hospitalization: None Spiritual Requests During Hospitalization: None Consults Spiritual Care Consult Needed: No Social Work Consult Needed: No Regulatory affairs officer (For Healthcare) Does patient have an advance directive?: No    Additional Information 1:1 In Past 12 Months?: No CIRT Risk: No Elopement Risk: No Does patient have medical clearance?: Yes  Child/Adolescent Assessment Running Away Risk: Denies (Patient is an adult)  Disposition:  Disposition Initial Assessment Completed for this Encounter: Yes Disposition of Patient: Other dispositions (ER MD Ordered Psych Consult)  On Site Evaluation by:   Reviewed with Physician:    Gunnar Fusi MS, LCAS, [place], [place], CCSI Therapeutic Triage Specialist 11/02/2015 5:28 PM

## 2015-11-02 NOTE — ED Notes (Addendum)
Per caregiver pt is hearing voices telling her to hurt herself. Denies any thoughts of HI.  Pt is voluntary at this time. Pt calm and cooperative in triage.   Pt is currently residing at a group home and has been taking all of her meds.

## 2015-11-02 NOTE — Discharge Instructions (Signed)
Increase your Prolixin according to Dr. Weber Cooks' instructions.  I'll your regular psychiatrist tomorrow to schedule a follow-up appointment as soon as possible. Return to the emergency department for thoughts of wanting to hurt herself or for any other concerns.

## 2015-11-16 ENCOUNTER — Emergency Department
Admission: EM | Admit: 2015-11-16 | Discharge: 2015-11-16 | Disposition: A | Discharge status: Home / Self Care | Payer: Medicaid Other | Attending: Emergency Medicine | Admitting: Emergency Medicine

## 2015-11-16 ENCOUNTER — Encounter: Payer: Self-pay | Admitting: Emergency Medicine

## 2015-11-16 ENCOUNTER — Emergency Department: Payer: Medicaid Other

## 2015-11-16 DIAGNOSIS — K5900 Constipation, unspecified: Secondary | ICD-10-CM | POA: Diagnosis present

## 2015-11-16 DIAGNOSIS — Y939 Activity, unspecified: Secondary | ICD-10-CM | POA: Insufficient documentation

## 2015-11-16 DIAGNOSIS — X58XXXA Exposure to other specified factors, initial encounter: Secondary | ICD-10-CM | POA: Insufficient documentation

## 2015-11-16 DIAGNOSIS — R45851 Suicidal ideations: Secondary | ICD-10-CM | POA: Diagnosis present

## 2015-11-16 DIAGNOSIS — Z791 Long term (current) use of non-steroidal anti-inflammatories (NSAID): Secondary | ICD-10-CM | POA: Diagnosis not present

## 2015-11-16 DIAGNOSIS — Y92009 Unspecified place in unspecified non-institutional (private) residence as the place of occurrence of the external cause: Secondary | ICD-10-CM | POA: Insufficient documentation

## 2015-11-16 DIAGNOSIS — I10 Essential (primary) hypertension: Secondary | ICD-10-CM | POA: Diagnosis not present

## 2015-11-16 DIAGNOSIS — F250 Schizoaffective disorder, bipolar type: Secondary | ICD-10-CM | POA: Insufficient documentation

## 2015-11-16 DIAGNOSIS — Z79899 Other long term (current) drug therapy: Secondary | ICD-10-CM | POA: Diagnosis not present

## 2015-11-16 DIAGNOSIS — T189XXA Foreign body of alimentary tract, part unspecified, initial encounter: Secondary | ICD-10-CM | POA: Diagnosis not present

## 2015-11-16 DIAGNOSIS — E785 Hyperlipidemia, unspecified: Secondary | ICD-10-CM | POA: Diagnosis not present

## 2015-11-16 DIAGNOSIS — Y999 Unspecified external cause status: Secondary | ICD-10-CM | POA: Insufficient documentation

## 2015-11-16 DIAGNOSIS — E039 Hypothyroidism, unspecified: Secondary | ICD-10-CM | POA: Diagnosis present

## 2015-11-16 DIAGNOSIS — F17210 Nicotine dependence, cigarettes, uncomplicated: Secondary | ICD-10-CM | POA: Insufficient documentation

## 2015-11-16 DIAGNOSIS — F329 Major depressive disorder, single episode, unspecified: Secondary | ICD-10-CM | POA: Insufficient documentation

## 2015-11-16 LAB — SALICYLATE LEVEL

## 2015-11-16 LAB — ACETAMINOPHEN LEVEL

## 2015-11-16 LAB — URINE DRUG SCREEN, QUALITATIVE (ARMC ONLY)
AMPHETAMINES, UR SCREEN: NOT DETECTED
BENZODIAZEPINE, UR SCRN: NOT DETECTED
Barbiturates, Ur Screen: NOT DETECTED
Cannabinoid 50 Ng, Ur ~~LOC~~: NOT DETECTED
Cocaine Metabolite,Ur ~~LOC~~: NOT DETECTED
MDMA (Ecstasy)Ur Screen: NOT DETECTED
METHADONE SCREEN, URINE: NOT DETECTED
Opiate, Ur Screen: NOT DETECTED
PHENCYCLIDINE (PCP) UR S: NOT DETECTED
TRICYCLIC, UR SCREEN: NOT DETECTED

## 2015-11-16 LAB — COMPREHENSIVE METABOLIC PANEL
ALT: 7 U/L — ABNORMAL LOW (ref 14–54)
ANION GAP: 7 (ref 5–15)
AST: 18 U/L (ref 15–41)
Albumin: 4.4 g/dL (ref 3.5–5.0)
Alkaline Phosphatase: 68 U/L (ref 38–126)
BILIRUBIN TOTAL: 0.3 mg/dL (ref 0.3–1.2)
BUN: 10 mg/dL (ref 6–20)
CO2: 21 mmol/L — ABNORMAL LOW (ref 22–32)
Calcium: 9.4 mg/dL (ref 8.9–10.3)
Chloride: 108 mmol/L (ref 101–111)
Creatinine, Ser: 0.8 mg/dL (ref 0.44–1.00)
Glucose, Bld: 112 mg/dL — ABNORMAL HIGH (ref 65–99)
POTASSIUM: 3.6 mmol/L (ref 3.5–5.1)
Sodium: 136 mmol/L (ref 135–145)
TOTAL PROTEIN: 7.3 g/dL (ref 6.5–8.1)

## 2015-11-16 LAB — CBC
HCT: 38.6 % (ref 35.0–47.0)
Hemoglobin: 13.2 g/dL (ref 12.0–16.0)
MCH: 31 pg (ref 26.0–34.0)
MCHC: 34.2 g/dL (ref 32.0–36.0)
MCV: 90.6 fL (ref 80.0–100.0)
PLATELETS: 183 10*3/uL (ref 150–440)
RBC: 4.26 MIL/uL (ref 3.80–5.20)
RDW: 14.3 % (ref 11.5–14.5)
WBC: 5.7 10*3/uL (ref 3.6–11.0)

## 2015-11-16 LAB — POCT PREGNANCY, URINE: PREG TEST UR: NEGATIVE

## 2015-11-16 LAB — ETHANOL

## 2015-11-16 IMAGING — CR DG NECK SOFT TISSUE
2 series · 2 of 2 positions shown · non-contrast
Comparison: Soft tissue neck [DATE].

CLINICAL DATA: The patient swallowed an open safety pin today.
Vomiting and sore throat.

EXAM:
NECK SOFT TISSUES - 1+ VIEW

[neck lat]
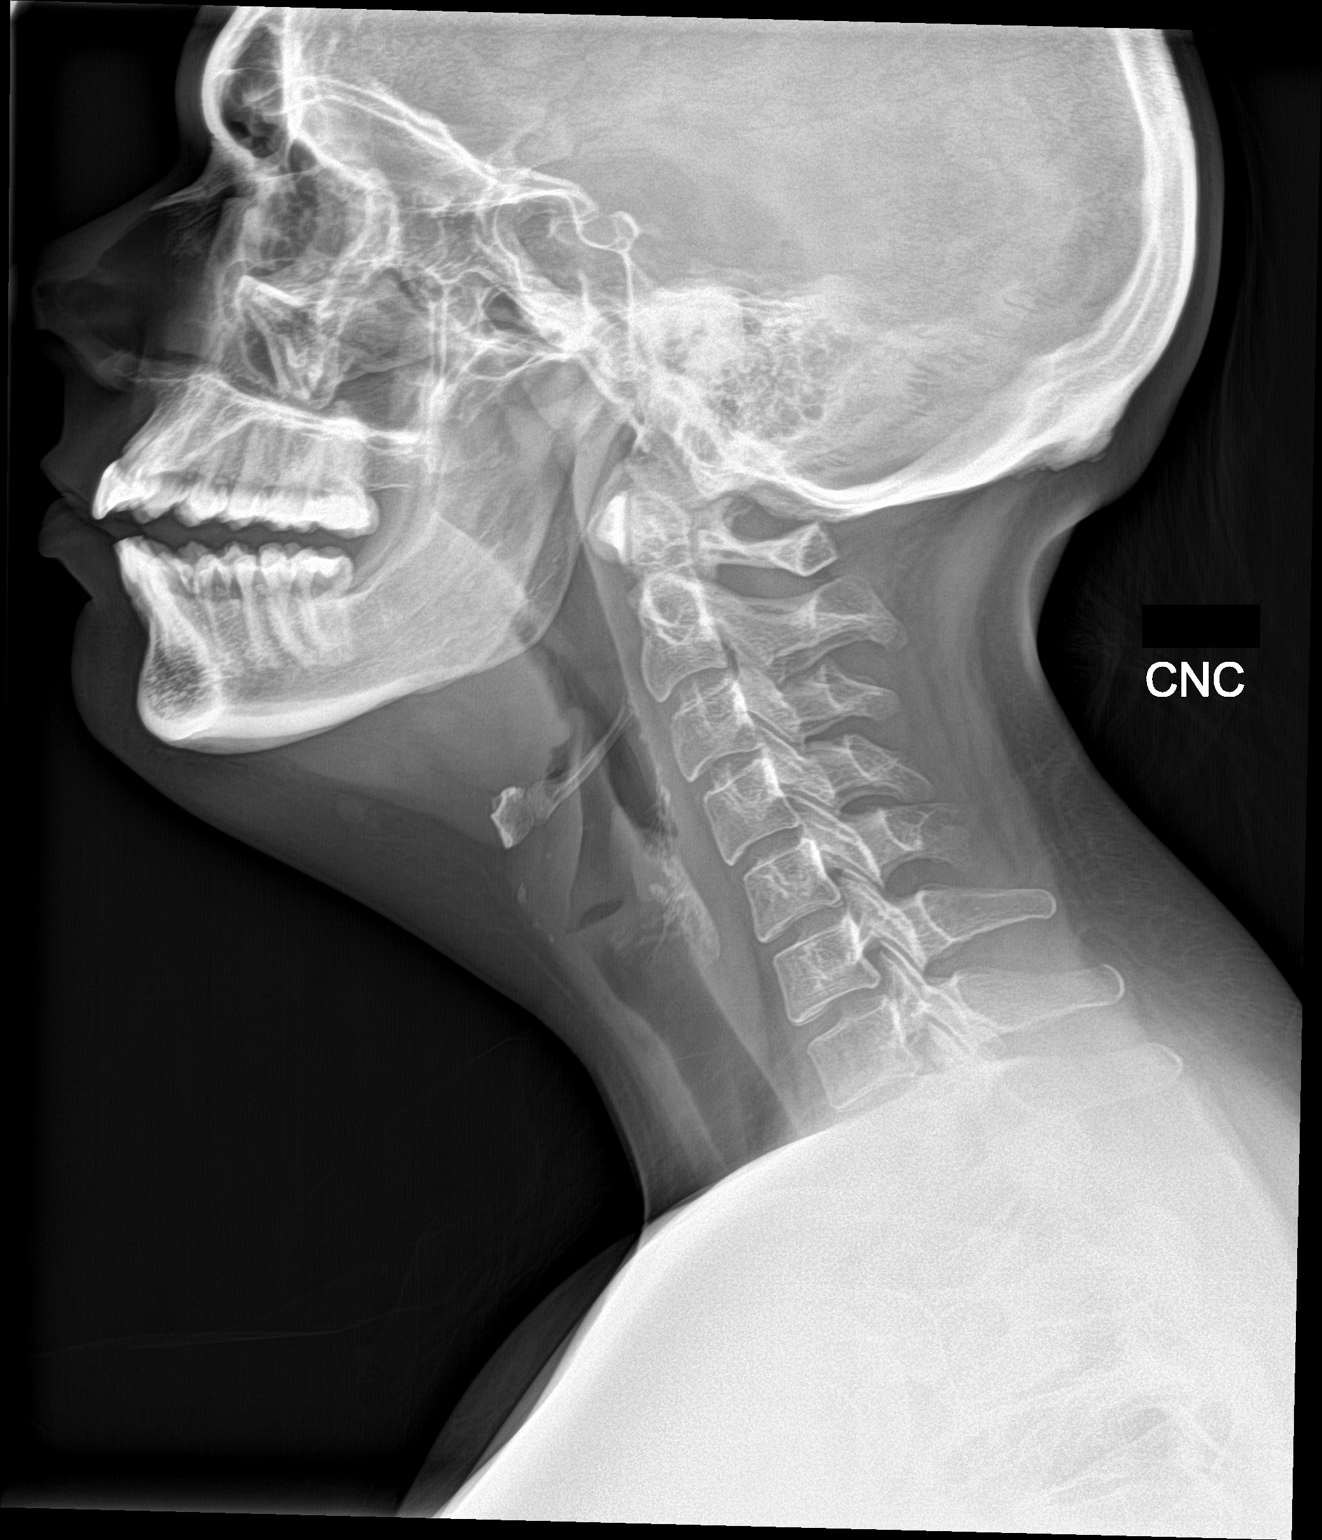

[neck ap]
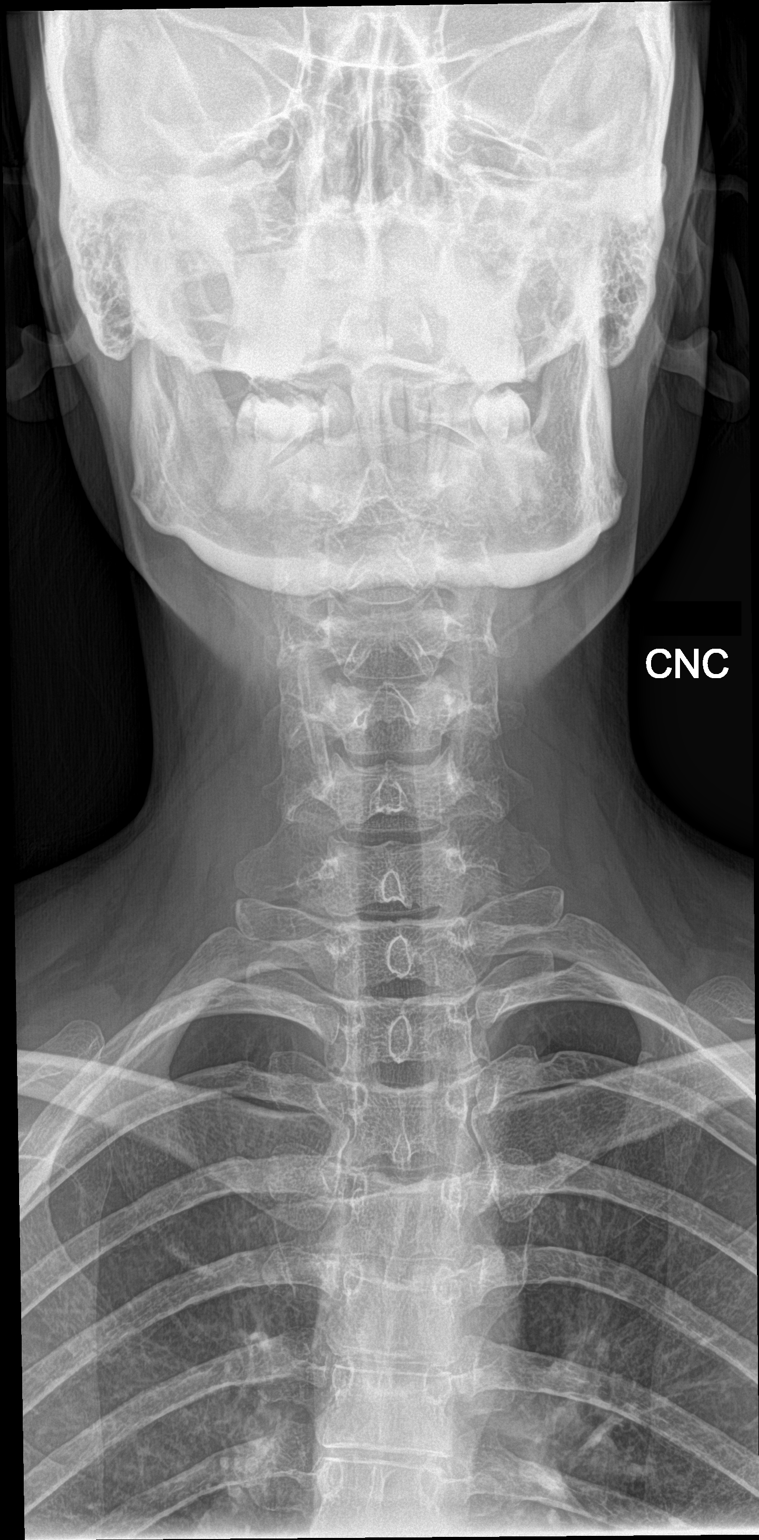

[2 of 2 positions shown; findings below may reference images not displayed]

FINDINGS: There is no evidence of retropharyngeal soft tissue swelling or
epiglottic enlargement. The cervical airway is unremarkable and no
radio-opaque foreign body identified.
IMPRESSION: Negative exam.  No radiopaque foreign body is seen.

## 2015-11-16 IMAGING — CR DG CHEST 2V
2 series · 2 of 2 positions shown · non-contrast
Comparison: [DATE]

CLINICAL DATA: Swallowed an open safety pin today vomiting since
then, sore throat

EXAM:
CHEST  2 VIEW

[chest pa]
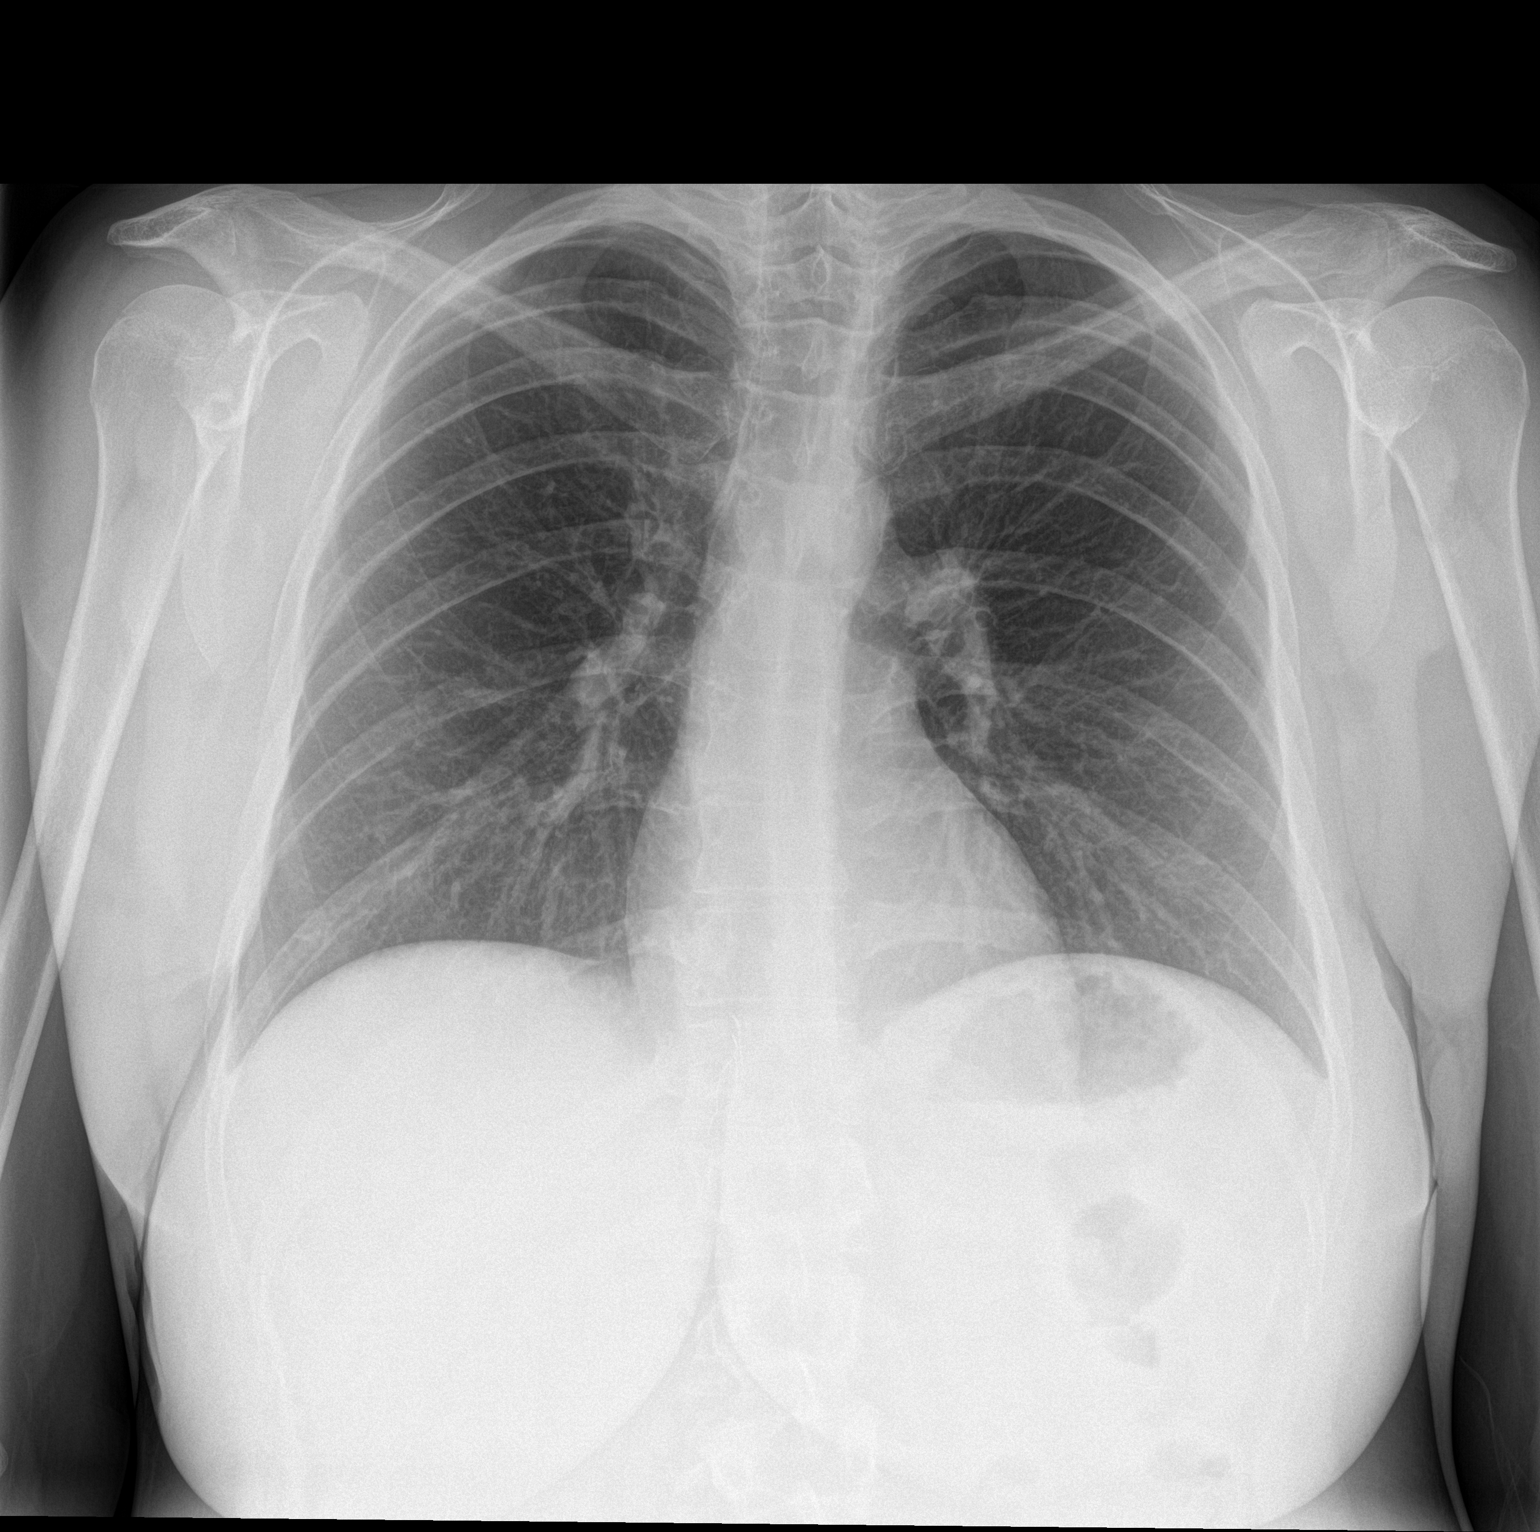

[chest lat]
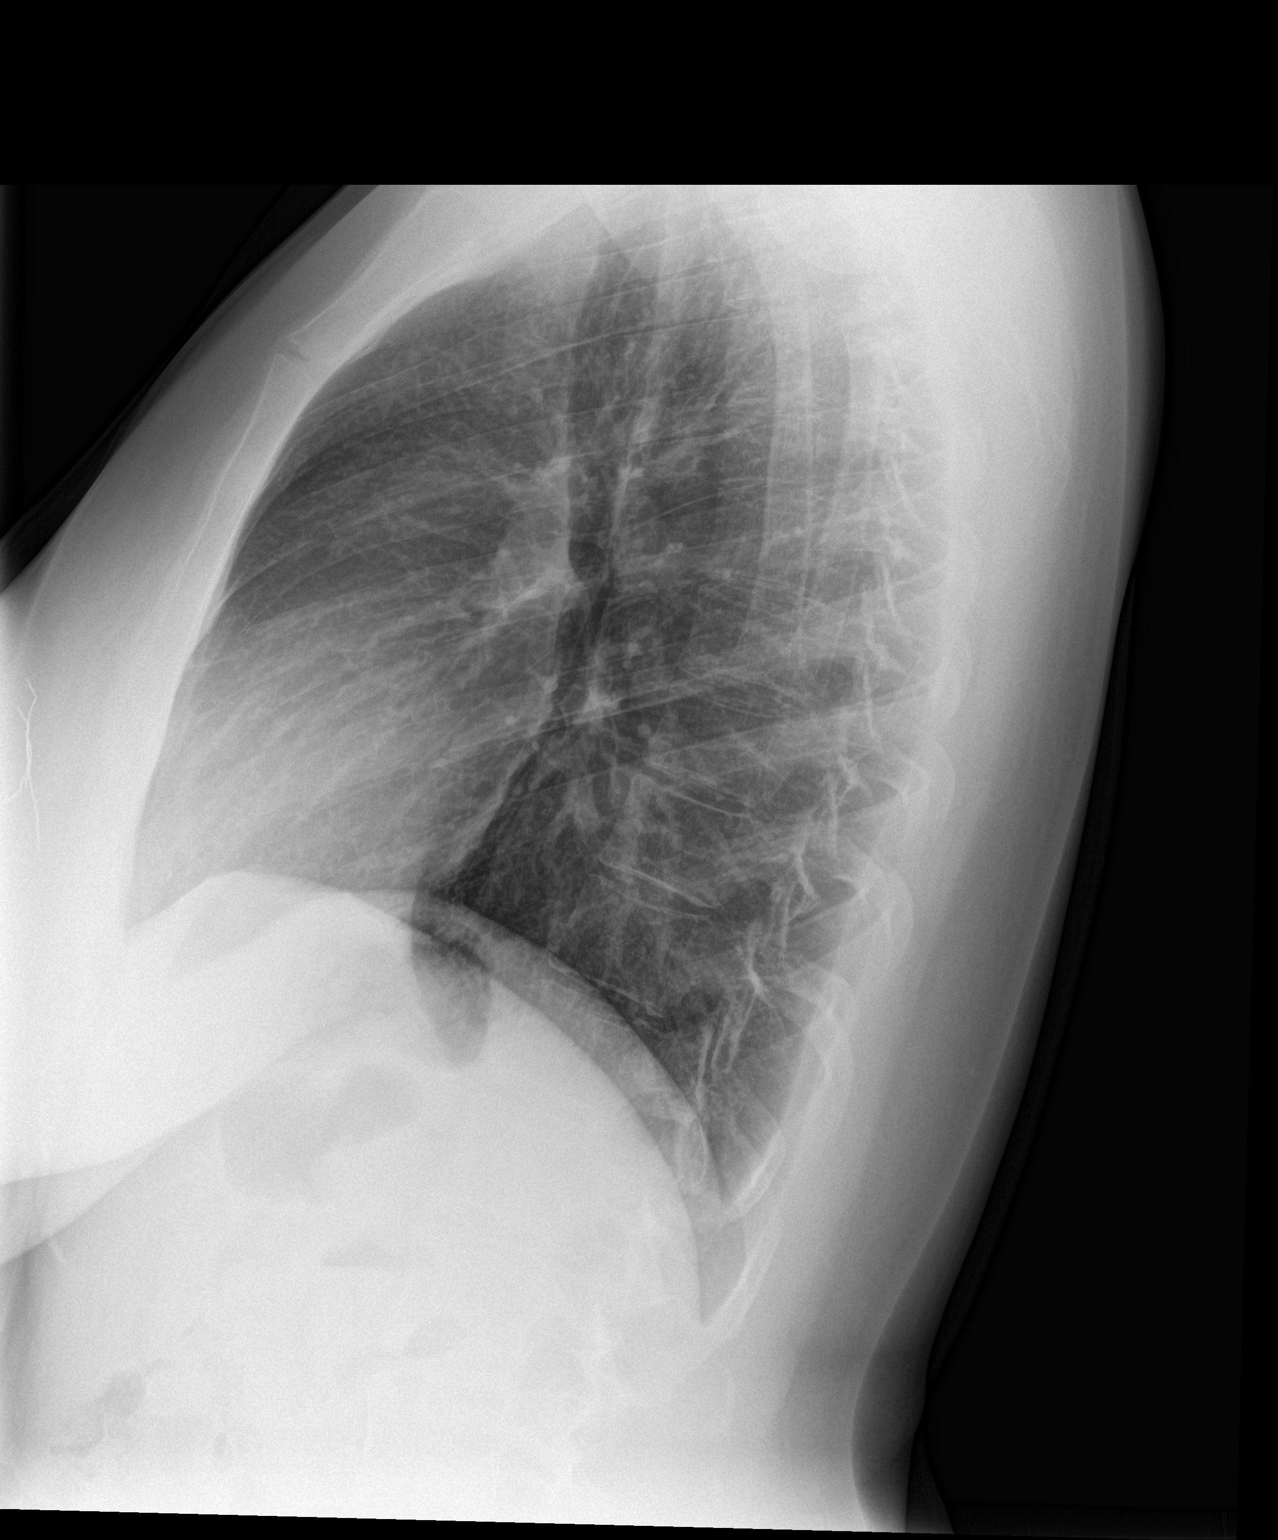

[2 of 2 positions shown; findings below may reference images not displayed]

FINDINGS: Cardiomediastinal silhouette is stable. No acute infiltrate or
pleural effusion. No pulmonary edema. Three metallic wire fragments
midline anterior lower chest wall are stable from prior exam. These
are best seen on lateral view. There is no pneumothorax. No metallic
safety pin is identified.
IMPRESSION: No acute infiltrate or pleural effusion. No pulmonary edema. Three
metallic wire fragments midline anterior lower chest wall are stable
from prior exam. These are best seen on lateral view.

## 2015-11-16 IMAGING — CR DG ABDOMEN 1V
2 series · 2 of 2 positions shown · non-contrast
Comparison: [DATE]

CLINICAL DATA: Swallowed un- open safety pain today, sore throat,
vomiting

EXAM:
ABDOMEN - 1 VIEW

[abdomen kub (1 of 2)]
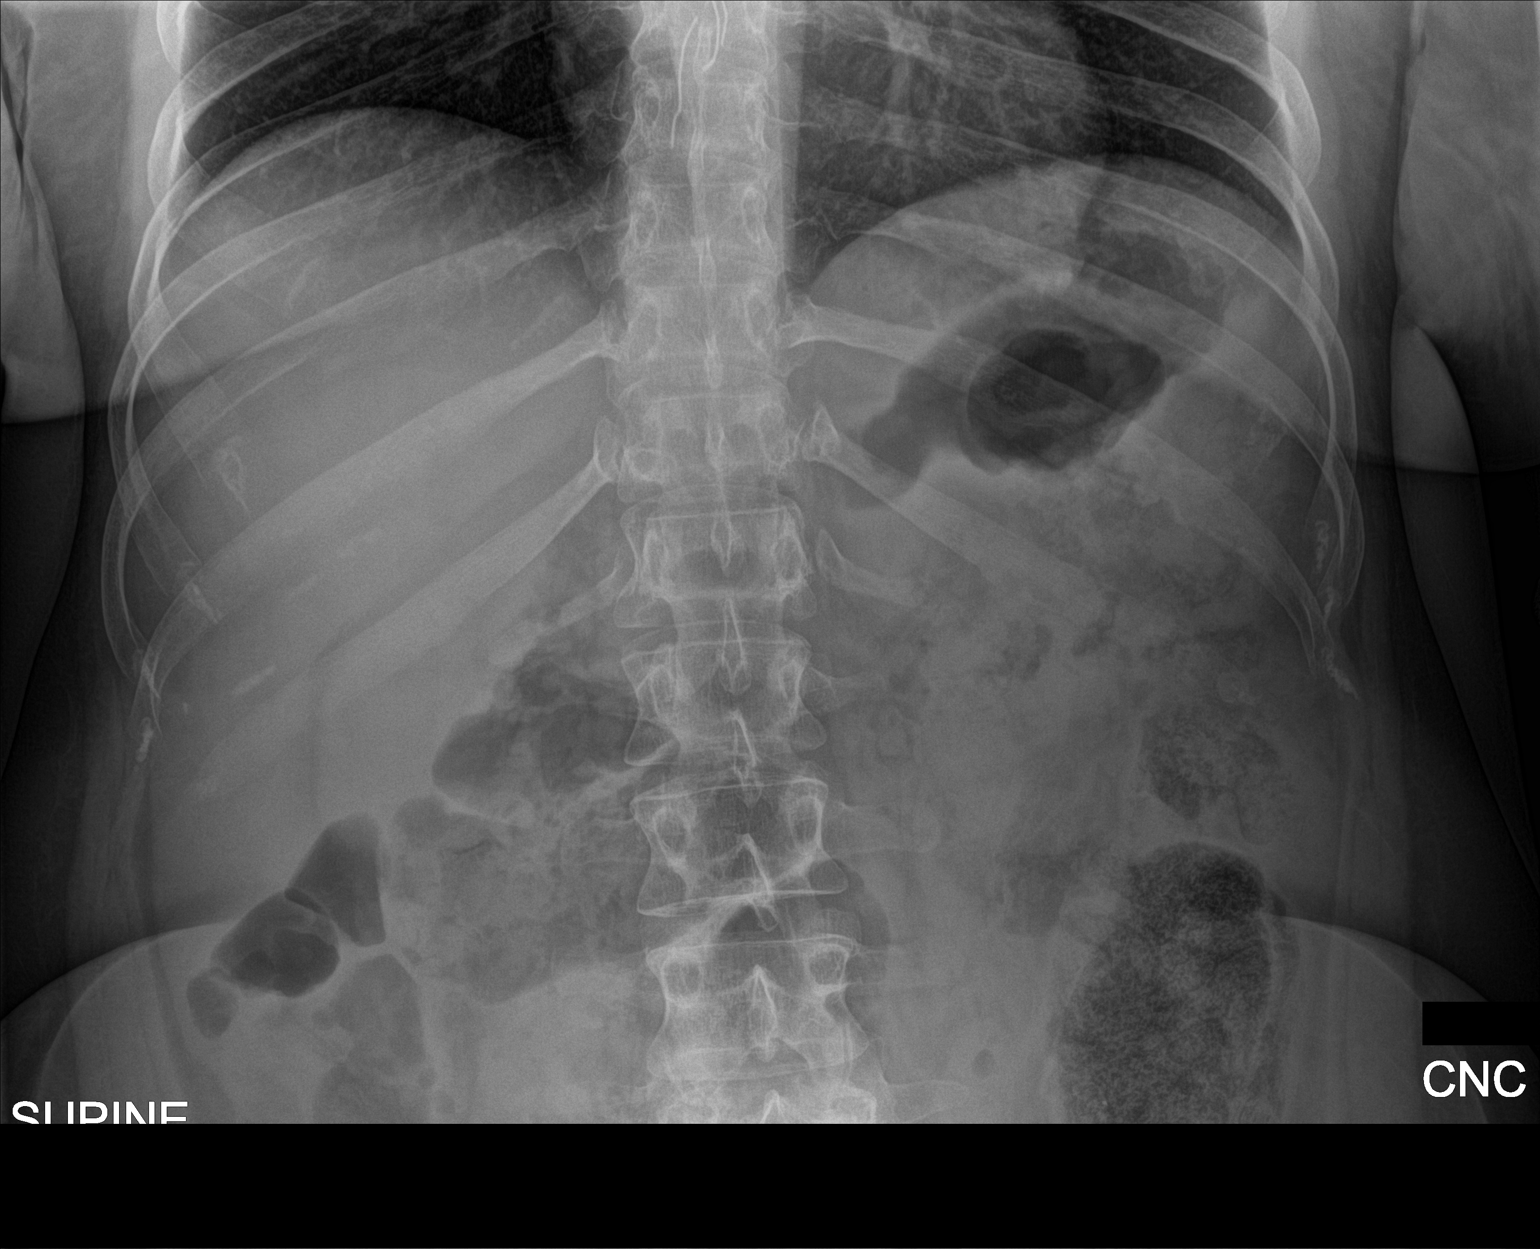

[abdomen kub (2 of 2)]
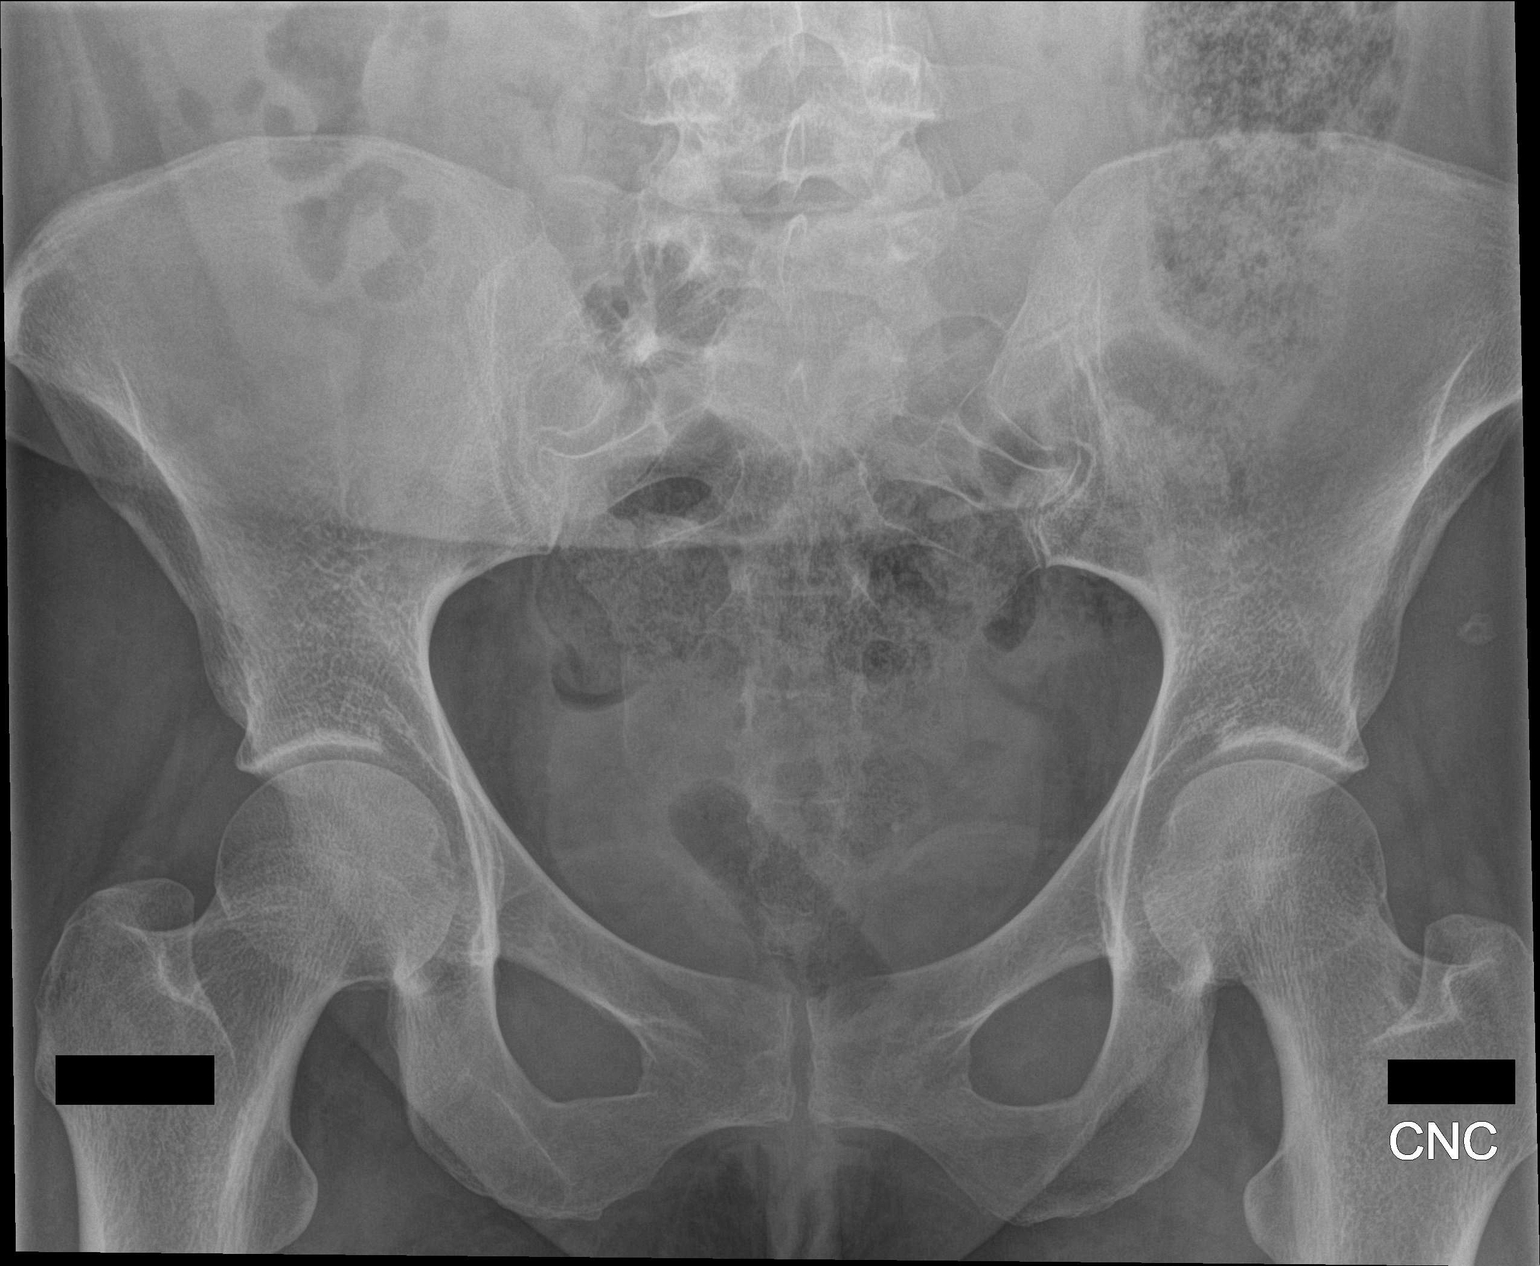

[2 of 2 positions shown; findings below may reference images not displayed]

FINDINGS: There is normal small bowel gas pattern. Some colonic stool noted in
transverse colon and descending colon. No metallic safety pin is
identified. Partially visualized metallic wire fragments midline
lower anterior chest wall.
IMPRESSION: Normal small bowel gas pattern. No metallic safety pin is
identified.

## 2015-11-16 MED ORDER — GI COCKTAIL ~~LOC~~
30.0000 mL | Freq: Once | ORAL | Status: AC
  Administered 2015-11-16: 30 mL via ORAL
  Filled 2015-11-16: qty 30

## 2015-11-16 MED ORDER — ONDANSETRON 4 MG PO TBDP: 4.0000 mg | ORAL_TABLET | Freq: Once | ORAL | Status: DC

## 2015-11-16 NOTE — ED Notes (Signed)
Pts group home has been called and informed that pt will be discharged. Caregiver reports she is on her way to pick up pt. Pt ambulatory independently and in NAD at time of discharge.

## 2015-11-16 NOTE — ED Notes (Signed)
Patient reports having a disagreement with group home staff because a church member bought patient feminine products and the group home staff told patient she shouldn't have asked someone to do that.  Patient states, "they cussed me out and I'm tired of it.  I tried to explain but she wouldn't listen to me."

## 2015-11-16 NOTE — ED Notes (Signed)
MD at bedside. 

## 2015-11-16 NOTE — ED Notes (Signed)
Pt states she swallowed an open safety pin this morning and then began walking to hospital.  [facility] PD found her walking on the road and convinced her to be brought in to ED.  Pt is IVC.  Pt states she did it because she is "tired of life".  Pt has history of swallowing foreign objects, h/o depression,  Schizophrenia, bipolar.  Pt appears calm in triage.

## 2015-11-16 NOTE — ED Notes (Signed)
Pt given meal tray and cup of sprite.

## 2015-11-16 NOTE — ED Notes (Signed)
Patient coughing into emesis back and coughed up open safety pin.  Safety pin removed from emesis bag, shown to physician and placed in sharps container.  Patient states, "If you make me go back to that group home I will swallow something else."

## 2015-11-16 NOTE — ED Notes (Signed)
BEHAVIORAL HEALTH ROUNDING Patient sleeping: No. Patient alert and oriented: yes Behavior appropriate: Yes.  ; If no, describe:  Nutrition and fluids offered: Yes  Toileting and hygiene offered: Yes  Sitter present: yes, Tammy, ED [employer] enforcement present: Yes

## 2015-11-16 NOTE — Consult Note (Signed)
[facility] Face-to-Face Psychiatry Consult   Reason for Consult:  Ingestion of foreign object Referring Physician:  ER Patient Identification: Melanie Bell MRN:  [id number] Principal Diagnosis: Schizoaffective disorder, bipolar type [facility]) Diagnosis:   Patient Active Problem List   Diagnosis Date Noted  . Foreign body ingestion [T18.9XXA] 07/31/2015  . Schizoaffective disorder, bipolar type ([place]) [F25.0] 11/06/2014  . Tobacco use disorder [F17.200] 09/30/2014  . Hypothyroidism [E03.9] 09/29/2014  . Hypertension [I10] 09/29/2014  . Constipation [K59.00] 09/29/2014    Total Time spent with patient: 1 hour  Subjective:   Melanie Bell is a 25 y.o. female patient admitted with attempting to ingest open safety pin .  HPI:    Pt states she swallowed an open safety pin this morning and then began walking to hospital. [facility] PD found her walking on the road and convinced her to be brought in to ED. Pt is IVC. Pt states she did it because she is "tired of life". Pt has history of swallowing foreign objects.  Per nursing: Patient coughing into emesis back and coughed up open safety pin. Safety pin removed from emesis bag, shown to physician and placed in sharps container. Patient states, "If you make me go back to that group home I will swallow something else."  I very familiar with this patient as she has been hospitalized in our hospital a multitude of times for swallowing foreign objects.  Patient just recently discharged from the hospital after a long stay due to swallowing battery and other metallic objects. Patient eventually require surgery to remove them.  Patient tells me she feels much better right now. She is not longer having any thoughts of wanting to harm herself or anybody else. She denies problems with auditory or visual hallucinations. She reports taking her medications as prescribed and the stated that the medications help her with hallucinations. She does not have  any concerns at this point about returning to the group home. She is placed to me that she got into an argument with the owner of the group home because one of her friends from church and brought her some feminine products.  The group home owner thought that the patient was asking the church member for things that the patient states that this actually was the present.  Patient phone as safety pin on one of her dresses and decided to swallow it after the argument.  She denies swallowing any other objects. After doing that the patient left the group home with the plans of walking to our hospital. She was found half way by police  Substance abuse history: Patient does not have any history of substance abuse.   Past Psychiatric History: Multiple psychiatric hospitalizations for hallucinations and foreign body ingestion. About a month ago patient actually had surgery to remove batteries that she had in her colon. Patient has follow-up earrings, screws, open safety pins, batteries and multiple other objects in the past.  Currently follows up with [facility] act team.  Risk to Self: Is patient at risk for suicide?: Yes----no Risk to Others:  ---no  Past Medical History:  Past Medical History  Diagnosis Date  . Hallucinations 09/30/2014    Sizoaffective  . Depression   . Anxiety   . Hypertension   . Tardive dyskinesia 10/2014    recent onset  . GERD (gastroesophageal reflux disease)   . Hyperlipidemia     Past Surgical History  Procedure Laterality Date  . Wisdom tooth extraction    . Esophagogastroduodenoscopy N/A 11/28/2014  Procedure: ESOPHAGOGASTRODUODENOSCOPY (EGD);  Surgeon: [name], MD;  Location: [facility] ENDOSCOPY;  Service: Endoscopy;  Laterality: N/A;  . Abdominal surgery      "years ago" to remove foreign objects  . Breast lumpectomy Right   . Appendectomy    . Colonoscopy with propofol N/A 09/10/2015    Procedure: COLONOSCOPY WITH PROPOFOL;  Surgeon: [name], MD;   Location: [facility] ENDOSCOPY;  Service: Endoscopy;  Laterality: N/A;  . Laparotomy N/A 09/12/2015    Procedure: EXPLORATORY LAPAROTOMY;  Surgeon: Florene Glen, MD;  Location: [facility] ORS;  Service: General;  Laterality: N/A;  . Sigmoidoscopy N/A 09/12/2015    Procedure: Lonell Face;  Surgeon: Florene Glen, MD;  Location: [facility] ORS;  Service: General;  Laterality: N/A;   Family History:  Family History  Problem Relation Age of Onset  . Depression Mother   . Hypertension Mother    Family Psychiatric  History:   Social History: Patient's mother is her legal guardian. Her mother currently lives in [place]. The patient is staying at a group home where she has been for many years. History  Alcohol Use No     History  Drug Use No    Social History   Social History  . Marital Status: Single    Spouse Name: N/A  . Number of Children: N/A  . Years of Education: N/A   Social History Main Topics  . Smoking status: Current Every Day Smoker -- 0.50 packs/day for 3 years    Types: Cigarettes  . Smokeless tobacco: Never Used  . Alcohol Use: No  . Drug Use: No  . Sexual Activity: No   Other Topics Concern  . None   Social History Narrative       Allergies:   Allergies  Allergen Reactions  . Betadine [Povidone Iodine] Other (See Comments)    Reaction:  Unknown   . Shellfish-Derived Products Other (See Comments)    Reaction:  Unknown   . Iodine Rash    Labs:  Results for orders placed or performed during the hospital encounter of 11/16/15 (from the past 48 hour(s))  Comprehensive metabolic panel     Status: Abnormal   Collection Time: 11/16/15 10:24 AM  Result Value Ref Range   Sodium 136 135 - 145 mmol/L   Potassium 3.6 3.5 - 5.1 mmol/L   Chloride 108 101 - 111 mmol/L   CO2 21 (L) 22 - 32 mmol/L   Glucose, Bld 112 (H) 65 - 99 mg/dL   BUN 10 6 - 20 mg/dL   Creatinine, Ser 0.80 0.44 - 1.00 mg/dL   Calcium 9.4 8.9 - 10.3 mg/dL   Total Protein 7.3 6.5 - 8.1 g/dL    Albumin 4.4 3.5 - 5.0 g/dL   AST 18 15 - 41 U/L   ALT 7 (L) 14 - 54 U/L   Alkaline Phosphatase 68 38 - 126 U/L   Total Bilirubin 0.3 0.3 - 1.2 mg/dL   GFR calc non Af Amer >60 >60 mL/min   GFR calc Af Amer >60 >60 mL/min    Comment: (NOTE) The eGFR has been calculated using the CKD EPI equation. This calculation has not been validated in all clinical situations. eGFR's persistently <60 mL/min signify possible Chronic Kidney Disease.    Anion gap 7 5 - 15  Ethanol     Status: None   Collection Time: 11/16/15 10:24 AM  Result Value Ref Range   Alcohol, Ethyl (B) <5 <5 mg/dL    Comment:  LOWEST DETECTABLE LIMIT FOR SERUM ALCOHOL IS 5 mg/dL FOR MEDICAL PURPOSES ONLY   Salicylate level     Status: None   Collection Time: 11/16/15 10:24 AM  Result Value Ref Range   Salicylate Lvl <3.8 2.8 - 30.0 mg/dL  Acetaminophen level     Status: Abnormal   Collection Time: 11/16/15 10:24 AM  Result Value Ref Range   Acetaminophen (Tylenol), Serum <10 (L) 10 - 30 ug/mL    Comment:        THERAPEUTIC CONCENTRATIONS VARY SIGNIFICANTLY. A RANGE OF 10-30 ug/mL MAY BE AN EFFECTIVE CONCENTRATION FOR MANY PATIENTS. HOWEVER, SOME ARE BEST TREATED AT CONCENTRATIONS OUTSIDE THIS RANGE. ACETAMINOPHEN CONCENTRATIONS >150 ug/mL AT 4 HOURS AFTER INGESTION AND >50 ug/mL AT 12 HOURS AFTER INGESTION ARE OFTEN ASSOCIATED WITH TOXIC REACTIONS.   cbc     Status: None   Collection Time: 11/16/15 10:24 AM  Result Value Ref Range   WBC 5.7 3.6 - 11.0 K/uL   RBC 4.26 3.80 - 5.20 MIL/uL   Hemoglobin 13.2 12.0 - 16.0 g/dL   HCT 38.6 35.0 - 47.0 %   MCV 90.6 80.0 - 100.0 fL   MCH 31.0 26.0 - 34.0 pg   MCHC 34.2 32.0 - 36.0 g/dL   RDW 14.3 11.5 - 14.5 %   Platelets 183 150 - 440 K/uL  Urine Drug Screen, Qualitative     Status: None   Collection Time: 11/16/15 10:24 AM  Result Value Ref Range   Tricyclic, Ur Screen NONE DETECTED NONE DETECTED   Amphetamines, Ur Screen NONE DETECTED NONE DETECTED    MDMA (Ecstasy)Ur Screen NONE DETECTED NONE DETECTED   Cocaine Metabolite,Ur [place] NONE DETECTED NONE DETECTED   Opiate, Ur Screen NONE DETECTED NONE DETECTED   Phencyclidine (PCP) Ur S NONE DETECTED NONE DETECTED   Cannabinoid 50 Ng, Ur [place] NONE DETECTED NONE DETECTED   Barbiturates, Ur Screen NONE DETECTED NONE DETECTED   Benzodiazepine, Ur Scrn NONE DETECTED NONE DETECTED   Methadone Scn, Ur NONE DETECTED NONE DETECTED    Comment: (NOTE) 101  Tricyclics, urine               Cutoff 1000 ng/mL 200  Amphetamines, urine             Cutoff 1000 ng/mL 300  MDMA (Ecstasy), urine           Cutoff 500 ng/mL 400  Cocaine Metabolite, urine       Cutoff 300 ng/mL 500  Opiate, urine                   Cutoff 300 ng/mL 600  Phencyclidine (PCP), urine      Cutoff 25 ng/mL 700  Cannabinoid, urine              Cutoff 50 ng/mL 800  Barbiturates, urine             Cutoff 200 ng/mL 900  Benzodiazepine, urine           Cutoff 200 ng/mL 1000 Methadone, urine                Cutoff 300 ng/mL 1100 1200 The urine drug screen provides only a preliminary, unconfirmed 1300 analytical test result and should not be used for non-medical 1400 purposes. Clinical consideration and professional judgment should 1500 be applied to any positive drug screen result due to possible 1600 interfering substances. A more specific alternate chemical method 1700 must be used in order to obtain a confirmed  analytical result.  1800 Gas chromato graphy / mass spectrometry (GC/MS) is the preferred 1900 confirmatory method.   Pregnancy, urine POC     Status: None   Collection Time: 11/16/15 11:04 AM  Result Value Ref Range   Preg Test, Ur NEGATIVE NEGATIVE    Comment:        THE SENSITIVITY OF THIS METHODOLOGY IS >24 mIU/mL     Current Facility-Administered Medications  Medication Dose Route Frequency Provider Last Rate Last Dose  . ondansetron (ZOFRAN-ODT) disintegrating tablet 4 mg  4 mg Oral Once Earleen Newport, MD        Current Outpatient Prescriptions  Medication Sig Dispense Refill  . acetaminophen (TYLENOL) 325 MG tablet Take 2 tablets (650 mg total) by mouth every 6 (six) hours as needed for mild pain. 30 tablet 0  . amantadine (SYMMETREL) 100 MG capsule Take 1 capsule (100 mg total) by mouth 2 (two) times daily. 60 capsule 0  . amLODipine (NORVASC) 2.5 MG tablet Take 1 tablet (2.5 mg total) by mouth daily. 30 tablet 0  . benztropine (COGENTIN) 1 MG tablet Take 1 tablet (1 mg total) by mouth at bedtime. 30 tablet 0  . docusate sodium (COLACE) 100 MG capsule Take 2 capsules (200 mg total) by mouth 2 (two) times daily. 120 capsule 0  . fluPHENAZine (PROLIXIN) 5 MG tablet Take 1 tablet (5 mg total) by mouth 4 (four) times daily. Dose as 35m in AM. 561mmidday and 1028mt night for a total of 61m104mday. This REPLACES her prior 5mg 49m dose 120 tablet 1  . levothyroxine (SYNTHROID, LEVOTHROID) 75 MCG tablet Take 1 tablet (75 mcg total) by mouth daily before breakfast. 30 tablet 0  . Lurasidone HCl 120 MG TABS Take 1 tablet (120 mg total) by mouth daily with supper. 30 tablet 0  . OXcarbazepine (TRILEPTAL) 150 MG tablet Take 1 tablet (150 mg total) by mouth 2 (two) times daily. 60 tablet 0  . oxyCODONE-acetaminophen (PERCOCET/ROXICET) 5-325 MG tablet Take 1 tablet by mouth every 4 (four) hours as needed for moderate pain. 30 tablet 0  . polyethylene glycol (MIRALAX / GLYCOLAX) packet Take 17 g by mouth daily. 30 each 0  . senna (SENOKOT) 8.6 MG TABS tablet Take 2 tablets (17.2 mg total) by mouth at bedtime. 60 each 0  . traZODone (DESYREL) 50 MG tablet Take 1 tablet (50 mg total) by mouth at bedtime. 30 tablet 0  . venlafaxine XR (EFFEXOR-XR) 150 MG 24 hr capsule Take 1 capsule (150 mg total) by mouth daily with breakfast. 30 capsule 0    Musculoskeletal: Strength & Muscle Tone: within normal limits Gait & Station: normal Patient leans: N/A  Psychiatric Specialty Exam: Physical Exam  Constitutional: She  is oriented to person, place, and time. She appears well-developed and well-nourished.  HENT:  Head: Normocephalic and atraumatic.  Eyes: EOM are normal.  Neck: Normal range of motion.  Respiratory: Effort normal.  Musculoskeletal: Normal range of motion.  Neurological: She is alert and oriented to person, place, and time.    Review of Systems  Constitutional: Negative.   HENT: Negative.   Eyes: Negative.   Respiratory: Negative.   Cardiovascular: Negative.   Gastrointestinal: Negative.   Genitourinary: Negative.   Musculoskeletal: Negative.   Skin: Negative.   Neurological: Negative.   Endo/Heme/Allergies: Negative.   Psychiatric/Behavioral: Negative.     Blood pressure 124/78, pulse 102, temperature 98.6 F (37 C), temperature source Oral, resp. rate 20, height _0  (  1.676 m), weight 87.998 kg (194 lb), SpO2 97 %.Body mass index is 31.33 kg/(m^2).  General Appearance: Well Groomed  Eye Contact:  Good  Speech:  Clear and Coherent  Volume:  Normal  Mood:  Euthymic  Affect:  Appropriate and Congruent  Thought Process:  Linear and Descriptions of Associations: Intact  Orientation:  Full (Time, Place, and Person)  Thought Content:  Hallucinations: None  Suicidal Thoughts:  No  Homicidal Thoughts:  No  Memory:  Immediate;   Good Recent;   Good Remote;   Good  Judgement:  Poor  Insight:  Shallow  Psychomotor Activity:  Normal  Concentration:  Concentration: Good and Attention Span: Good  Recall:  Good  Fund of Knowledge:  Fair  Language:  Good  Akathisia:  No  Handed:    AIMS (if indicated):     Assets:  Armed forces logistics/support/administrative officer Social Support  ADL's:  Intact  Cognition:  WNL  Sleep:        Treatment Plan Summary:  Patient has history of swallowing foreign objects in an attempt to seek hospitalization. Continuing to provide hospitalization after this incidence is only reinforcing her behavior.  Patient has reported multiple times in the past that she likes to coming  to the hospital because she can have sodas and hamburgers. At this time the patient will not be hospitalized psychiatrically. The plan will be to discharge her back to the group home with the plan to follow up with [facility] act team.  Schizoaffective disorder the patient will be continued on Prolixin 10 mg in the morning and 20 mg at bedtime. Continued Latuda 120 mg a day.  Mood stabilization: Continue Trileptal.  Depressive symptoms: Continue Effexor 150 mg by mouth daily  Insomnia continue trazodone daily at bedtime  No medication changes will be made today  At this time the patient is not meeting criteria for inpatient psychiatric hospitalization.  Patient will be discharged back to her group home  Patient will continue to follow-up with her outpatient psychiatrist--- PSI act.  Case discussed with ER physician and the staff.   Disposition: No evidence of imminent risk to self or others at present.   Patient does not meet criteria for psychiatric inpatient admission.  [name], MD 11/16/2015 2:12 PM

## 2015-11-16 NOTE — ED Provider Notes (Addendum)
[facility] Emergency Department Provider Note        Time seen: ----------------------------------------- 11:02 AM on 11/16/2015 -----------------------------------------    I have reviewed the triage vital signs and the nursing notes.   HISTORY  Chief Complaint Swallowed [place]d Object     HPI Melanie Bell is a 25 y.o. female who presents the ER stating she was trying to harm herself by swallowing a safety pin. Patient states safety pin was open. She had a disagreement with the group home staff and states she is tired of the treatment she was receiving there. She swallowed an open safety pin and began walking to the hospital. [facility] Department found her walking on the road and convinced her to come to the ER. She states she started life. She does have a history of swallowing foreign bodies. She has a history of depression, schizophrenia and bipolar disorder.   Past Medical History  Diagnosis Date  . Hallucinations 09/30/2014    Sizoaffective  . Depression   . Anxiety   . Hypertension   . Tardive dyskinesia 10/2014    recent onset  . GERD (gastroesophageal reflux disease)   . Hyperlipidemia     Patient Active Problem List   Diagnosis Date Noted  . Foreign body in colon   . Foreign body alimentary tract   . Foreign body ingestion 07/31/2015  . Schizoaffective disorder, bipolar type ([place]) 11/06/2014  . Tobacco use disorder 09/30/2014  . Hypothyroidism 09/29/2014  . Hypertension 09/29/2014  . Constipation 09/29/2014    Past Surgical History  Procedure Laterality Date  . Wisdom tooth extraction    . Esophagogastroduodenoscopy N/A 11/28/2014    Procedure: ESOPHAGOGASTRODUODENOSCOPY (EGD);  Surgeon: [name], MD;  Location: [facility] ENDOSCOPY;  Service: Endoscopy;  Laterality: N/A;  . Abdominal surgery      "years ago" to remove foreign objects  . Breast lumpectomy Right   . Appendectomy    . Colonoscopy with propofol N/A  09/10/2015    Procedure: COLONOSCOPY WITH PROPOFOL;  Surgeon: [name], MD;  Location: [facility] ENDOSCOPY;  Service: Endoscopy;  Laterality: N/A;  . Laparotomy N/A 09/12/2015    Procedure: EXPLORATORY LAPAROTOMY;  Surgeon: Florene Glen, MD;  Location: [facility] ORS;  Service: General;  Laterality: N/A;  . Sigmoidoscopy N/A 09/12/2015    Procedure: Lonell Face;  Surgeon: Florene Glen, MD;  Location: [facility] ORS;  Service: General;  Laterality: N/A;    Allergies Betadine; Shellfish-derived products; and Iodine  Social History Social History  Substance Use Topics  . Smoking status: Current Every Day Smoker -- 0.50 packs/day for 3 years    Types: Cigarettes  . Smokeless tobacco: Never Used  . Alcohol Use: No    Review of Systems Constitutional: Negative for fever. ENT: Positive for sore throat and difficulty swallowing, hematemesis Cardiovascular: Negative for chest pain. Respiratory: Negative for shortness of breath. Gastrointestinal: Negative for abdominal pain, positive for vomiting Genitourinary: Negative for dysuria. Musculoskeletal: Negative for back pain. Skin: Negative for rash. Neurological: Negative for headaches, focal weakness or numbness.  10-point ROS otherwise negative.  ____________________________________________   PHYSICAL EXAM:  VITAL SIGNS: ED Triage Vitals  Enc Vitals Group     BP 11/16/15 1015 124/78 mmHg     Pulse Rate 11/16/15 1015 102     Resp 11/16/15 1015 20     Temp 11/16/15 1015 98.6 F (37 C)     Temp Source 11/16/15 1015 Oral     SpO2 11/16/15 1015 97 %  Weight 11/16/15 1015 194 lb (87.998 kg)     Height 11/16/15 1015 5\' 6"  (1.676 m)     Head Cir --      Peak Flow --      Pain Score 11/16/15 1016 5     Pain Loc --      Pain Edu? --      Excl. in [place]? --     Constitutional: Alert and oriented. Mild distress Eyes: Conjunctivae are normal. PERRL. Normal extraocular movements. ENT   Head: Normocephalic and atraumatic.    Nose: No congestion/rhinnorhea.   Mouth/Throat: Mucous membranes are moist, blood is noted in the mouth   Neck: No stridor. Cardiovascular: Normal rate, regular rhythm. No murmurs, rubs, or gallops. Respiratory: Normal respiratory effort without tachypnea nor retractions. Breath sounds are clear and equal bilaterally. No wheezes/rales/rhonchi. Gastrointestinal: Soft and nontender. Normal bowel sounds Musculoskeletal: Nontender with normal range of motion in all extremities. No lower extremity tenderness nor edema. Neurologic:  Normal speech and language. No gross focal neurologic deficits are appreciated.  Skin:  Skin is warm, dry and intact. No rash noted. Psychiatric: Depressed mood and affect, suicidal ideation is present ____________________________________________  ED COURSE:  Pertinent labs & imaging results that were available during my care of the patient were reviewed by me and considered in my medical decision making (see chart for details). Patient presented to the ER after swallowing an open safety pin. She subsequently vomited the safety pin backup. There was some hematemesis and ball. Currently she appears stable. We will obtain basic imaging. ____________________________________________    LABS (pertinent positives/negatives)  Labs Reviewed  COMPREHENSIVE METABOLIC PANEL - Abnormal; Notable for the following:    CO2 21 (*)    Glucose, Bld 112 (*)    ALT 7 (*)    All other components within normal limits  ACETAMINOPHEN LEVEL - Abnormal; Notable for the following:    Acetaminophen (Tylenol), Serum <10 (*)    All other components within normal limits  ETHANOL  SALICYLATE LEVEL  CBC  URINE DRUG SCREEN, QUALITATIVE ([facility] ONLY)  POCT PREGNANCY, URINE    RADIOLOGY Images were viewed by me  Chest x-ray, soft tissue neck, KUB IMPRESSION: No acute infiltrate or pleural effusion. No pulmonary edema. Three metallic wire fragments midline anterior lower chest wall  are stable from prior exam. These are best seen on lateral view.  IMPRESSION: Negative exam. No radiopaque foreign body is seen.  IMPRESSION: Normal small bowel gas pattern. No metallic safety pin is identified. ____________________________________________  FINAL ASSESSMENT AND PLAN  Ingested foreign body, depression, suicidal ideation  Plan: Patient with labs and imaging as dictated above. I discussed the case with gastroenterology who does not feel she needs endoscopy or further testing. She will need repeat chest x-ray daily. She appears medically stable for psychiatric evaluation and disposition.   Patient is advised to follow-up for chest x-ray tomorrow. She has been cleared by psychiatry for discharge. She appears medically stable for discharge. Earleen Newport, MD   Note: This dictation was prepared with Dragon dictation. Any transcriptional errors that result from this process are unintentional   Earleen Newport, MD 11/16/15 [place], MD 11/16/15 [phone]

## 2015-11-16 NOTE — ED Notes (Signed)
Tammy is 1:1 sitter for patient at this time.

## 2015-11-16 NOTE — ED Notes (Signed)
Pt was given her bag of belongings and verbalizes that the items given back to her are complete regarding what pt brought in. Pt was given a cup of ice water.

## 2015-11-16 NOTE — Discharge Instructions (Signed)
Suicidal Feelings: How to Help Yourself  Suicide is the taking of one's own life. If you feel as though life is getting too tough to handle and are thinking about suicide, get help right away. To get help:   Call your local emergency services (911 in the [place]   Call a suicide hotline to speak with a trained counselor who understands how you are feeling. The following is a list of suicide hotlines in the [place]. For a list of hotlines in [place], visit www.suicide.org/hotlines/international/[place]-suicide-hotlines.html.    1-800-273-TALK (1-800-273-8255).    1-800-SUICIDE (1-800-784-2433).    1-888-628-9454. This is a hotline for Spanish speakers.    1-800-799-4TTY (1-800-799-4889). This is a hotline for TTY users.    1-866-4-U-TREVOR (1-866-488-7386). This is a hotline for lesbian, gay, bisexual, transgender, or questioning youth.   Contact a crisis center or a local suicide prevention center. To find a crisis center or suicide prevention center:    Call your local hospital, clinic, community service organization, mental health center, social service provider, or health department. Ask for assistance in connecting to a crisis center.    Visit www.suicidepreventionlifeline.org/getinvolved/locator for a list of crisis centers in the [place], or visit www.suicideprevention.[place]/thinking-about-suicide/find-a-crisis-centre for a list of centers in [place].   Visit the following websites:    National Suicide Prevention Lifeline: www.suicidepreventionlifeline.org    Hopeline: www.hopeline.com    American Foundation for Suicide Prevention: www.afsp.org    The Trevor Project (for lesbian, gay, bisexual, transgender, or questioning youth): www.thetrevorproject.org  HOW CAN I HELP MYSELF FEEL BETTER?   Promise yourself that you will not do anything drastic when you have suicidal feelings. Remember, there is hope. Many people have gotten through suicidal thoughts and feelings, and you will, too. You may  have gotten through them before, and this proves that you can get through them again.   Let family, friends, teachers, or counselors know how you are feeling. Try not to isolate yourself from those who care about you. Remember, they will want to help you. Talk with someone every day, even if you do not feel sociable. Face-to-face conversation is best.   Call a mental health professional and see one regularly.   Visit your primary health care provider every year.   Eat a well-balanced diet, and space your meals so you eat regularly.   Get plenty of rest.   Avoid alcohol and drugs, and remove them from your home. They will only make you feel worse.   If you are thinking of taking a lot of medicine, give your medicine to someone who can give it to you one day at a time. If you are on antidepressants and are concerned you will overdose, let your health care provider know so he or she can give you safer medicines. Ask your mental health professional about the possible side effects of any medicines you are taking.   Remove weapons, poisons, knives, and anything else that could harm you from your home.   Try to stick to routines. Follow a schedule every day. Put self-care on your schedule.   Make a list of realistic goals, and cross them off when you achieve them. Accomplishments give a sense of worth.   Wait until you are feeling better before doing the things you find difficult or unpleasant.   Exercise if you are able. You will feel better if you exercise for even a half hour each day.   Go out in the sun or into nature. This will help you   else that takes your mind off your depression if it is safe to do.  Keep your living space well lit.  When you are feeling well,  write yourself a letter about tips and support that you can read when you are not feeling well.  Remember that life's difficulties can be sorted out with help. Conditions can be treated. You can work on thoughts and strategies that serve you well.   This information is not intended to replace advice given to you by your health care provider. Make sure you discuss any questions you have with your health care provider.   Document Released: 11/06/2002 Document Revised: 05/23/2014 Document Reviewed: 08/27/2013 Elsevier Interactive Patient Education 2016 [place] Foreign Body, Adult A swallowed foreign body is an object that gets stuck in the tube that connects your throat to your stomach (esophagus) or in another part of your digestive tract. Foreign bodies may be swallowed by accident or on purpose. When you swallow an object, it passes into your esophagus. The narrowest place in your digestive system is where your esophagus meets your stomach. If the object can pass through that place, it will usually continue through the rest of your digestive system without causing problems. A foreign body that gets stuck may need to be removed. It is very important to tell your health care provider what you have swallowed. Certain swallowed items can be life-threatening. You may need emergency treatment. Dangerous swallowed foreign bodies include:  Objects that get stuck in your throat.  Objects that interfere with your breathing.  Sharp objects.  Harmful objects, such as batteries or illegal drugs. CAUSES The most common swallowed foreign body is food that will not pass through your esophagus to your stomach (food impaction). Foods that commonly become impacted include meats and hard vegetables, such as carrots and radishes. Other common swallowed foreign bodies include:  Pieces of bone from meats.  Toothpicks.  Dentures. RISK FACTORS You are more likely to have a swallowed foreign  body if:  You wear dentures.  You have been drinking alcohol or taking drugs.  You have a mental health condition.  You have a narrowed or scarred area in your digestive tract. SYMPTOMS  Pain or pressure in your throat or chest.  Not being able to swallow food or liquid.  Not being able to swallow your saliva.  Choking.  Hoarse voice.  Trouble breathing. DIAGNOSIS This condition may be diagnosed based on your symptoms and medical history. Your health care provider will do a physical exam to confirm the diagnosis and to find the object. Imaging studies may be done, including:  X-rays.  A CT scan. Some objects may not be seen on imaging studies. In those cases, an exam may be done using a long tubelike scope to look into your esophagus (endoscopy). The tube (endoscope) that is used for this exam may be stiff (rigid) or flexible, depending on where the foreign body is stuck. TREATMENT Usually, an object that has passed into your stomach but is not dangerous will pass out of your digestive system without treatment. If the swallowed object is not dangerous but it is stuck in your esophagus:  You may be given medicine to relax the muscles of your esophagus to allow the object to pass through.  Endoscopy may be done to find and remove the object if it does not pass with medicine. Your health care provider will put medical instruments through the endoscope to remove the object. You may need emergency  treatment if:  The object is in your esophagus and is causing you to inhale saliva into your lungs (aspirate).  The object is in your esophagus and is pressing on your airway. This makes it hard for you to breathe.  The object can damage your digestive tract. Some objects that can cause damage include batteries, magnets, sharp objects, and drugs. HOME CARE INSTRUCTIONS If the object in your digestive system is expected to pass:  Continue eating what you usually eat, unless your  health care provider gives you different instructions.  Check your stool after every bowel movement to see if you have passed the object.  Contact your health care provider if the object has not passed after 3 days. If you had endoscopic surgery to remove the foreign body:  Follow instructions from your health care provider about caring for yourself after the procedure. Keep all follow-up visits and repeat imaging tests as told by your health care provider. This is important. SEEK MEDICAL CARE IF:  You continue to have symptoms of a swallowed foreign body.  The object has not passed out of your body after 3 days. SEEK IMMEDIATE MEDICAL CARE IF:  You have a fever.  You have pain in your chest or your abdomen.  You cough up blood.  You have blood in your stool (feces) or your vomit.   This information is not intended to replace advice given to you by your health care provider. Make sure you discuss any questions you have with your health care provider.   Document Released: 10/20/2009 Document Revised: 01/21/2015 Document Reviewed: 07/30/2014 Elsevier Interactive Patient Education [employer].

## 2015-11-26 ENCOUNTER — Emergency Department: Payer: Medicaid Other

## 2015-11-26 ENCOUNTER — Encounter: Payer: Self-pay | Admitting: *Deleted

## 2015-11-26 ENCOUNTER — Emergency Department
Admission: EM | Admit: 2015-11-26 | Discharge: 2015-11-26 | Disposition: A | Discharge status: Home / Self Care | Payer: Medicaid Other | Attending: Emergency Medicine | Admitting: Emergency Medicine

## 2015-11-26 DIAGNOSIS — F919 Conduct disorder, unspecified: Secondary | ICD-10-CM | POA: Insufficient documentation

## 2015-11-26 DIAGNOSIS — F329 Major depressive disorder, single episode, unspecified: Secondary | ICD-10-CM | POA: Diagnosis not present

## 2015-11-26 DIAGNOSIS — T189XXA Foreign body of alimentary tract, part unspecified, initial encounter: Secondary | ICD-10-CM | POA: Diagnosis present

## 2015-11-26 DIAGNOSIS — E039 Hypothyroidism, unspecified: Secondary | ICD-10-CM | POA: Diagnosis present

## 2015-11-26 DIAGNOSIS — X788XXA Intentional self-harm by other sharp object, initial encounter: Secondary | ICD-10-CM | POA: Insufficient documentation

## 2015-11-26 DIAGNOSIS — T1491 Suicide attempt: Secondary | ICD-10-CM | POA: Diagnosis present

## 2015-11-26 DIAGNOSIS — F250 Schizoaffective disorder, bipolar type: Secondary | ICD-10-CM | POA: Insufficient documentation

## 2015-11-26 DIAGNOSIS — E785 Hyperlipidemia, unspecified: Secondary | ICD-10-CM | POA: Insufficient documentation

## 2015-11-26 DIAGNOSIS — F17210 Nicotine dependence, cigarettes, uncomplicated: Secondary | ICD-10-CM | POA: Diagnosis not present

## 2015-11-26 DIAGNOSIS — Z79899 Other long term (current) drug therapy: Secondary | ICD-10-CM | POA: Insufficient documentation

## 2015-11-26 DIAGNOSIS — I10 Essential (primary) hypertension: Secondary | ICD-10-CM | POA: Insufficient documentation

## 2015-11-26 DIAGNOSIS — T189XXD Foreign body of alimentary tract, part unspecified, subsequent encounter: Secondary | ICD-10-CM | POA: Insufficient documentation

## 2015-11-26 LAB — URINE DRUG SCREEN, QUALITATIVE (ARMC ONLY)
Amphetamines, Ur Screen: NOT DETECTED
BARBITURATES, UR SCREEN: NOT DETECTED
Benzodiazepine, Ur Scrn: NOT DETECTED
CANNABINOID 50 NG, UR ~~LOC~~: NOT DETECTED
Cocaine Metabolite,Ur ~~LOC~~: NOT DETECTED
MDMA (Ecstasy)Ur Screen: NOT DETECTED
METHADONE SCREEN, URINE: NOT DETECTED
Opiate, Ur Screen: NOT DETECTED
Phencyclidine (PCP) Ur S: NOT DETECTED
TRICYCLIC, UR SCREEN: NOT DETECTED

## 2015-11-26 LAB — CBC
HCT: 38.7 % (ref 35.0–47.0)
HEMOGLOBIN: 13.4 g/dL (ref 12.0–16.0)
MCH: 31.2 pg (ref 26.0–34.0)
MCHC: 34.7 g/dL (ref 32.0–36.0)
MCV: 89.8 fL (ref 80.0–100.0)
PLATELETS: 189 10*3/uL (ref 150–440)
RBC: 4.31 MIL/uL (ref 3.80–5.20)
RDW: 13.9 % (ref 11.5–14.5)
WBC: 5.4 10*3/uL (ref 3.6–11.0)

## 2015-11-26 LAB — COMPREHENSIVE METABOLIC PANEL
ALT: 7 U/L — ABNORMAL LOW (ref 14–54)
ANION GAP: 6 (ref 5–15)
AST: 16 U/L (ref 15–41)
Albumin: 4.4 g/dL (ref 3.5–5.0)
Alkaline Phosphatase: 78 U/L (ref 38–126)
BUN: 9 mg/dL (ref 6–20)
CALCIUM: 9.5 mg/dL (ref 8.9–10.3)
CHLORIDE: 112 mmol/L — AB (ref 101–111)
CO2: 24 mmol/L (ref 22–32)
Creatinine, Ser: 0.74 mg/dL (ref 0.44–1.00)
Glucose, Bld: 87 mg/dL (ref 65–99)
Potassium: 4.1 mmol/L (ref 3.5–5.1)
SODIUM: 142 mmol/L (ref 135–145)
Total Bilirubin: 0.6 mg/dL (ref 0.3–1.2)
Total Protein: 7.4 g/dL (ref 6.5–8.1)

## 2015-11-26 LAB — ETHANOL

## 2015-11-26 LAB — ACETAMINOPHEN LEVEL

## 2015-11-26 LAB — POCT PREGNANCY, URINE: PREG TEST UR: NEGATIVE

## 2015-11-26 LAB — SALICYLATE LEVEL

## 2015-11-26 IMAGING — CR DG ABDOMEN ACUTE W/ 1V CHEST
3 series · 3 of 3 positions shown · non-contrast
Comparison: Abdomen film [DATE].  Chest x-ray [DATE].

CLINICAL DATA: Swallowed a safety pin and a thumb tack at 7 p.m.
last night.

EXAM:
DG ABDOMEN ACUTE W/ 1V CHEST

[chest pa]
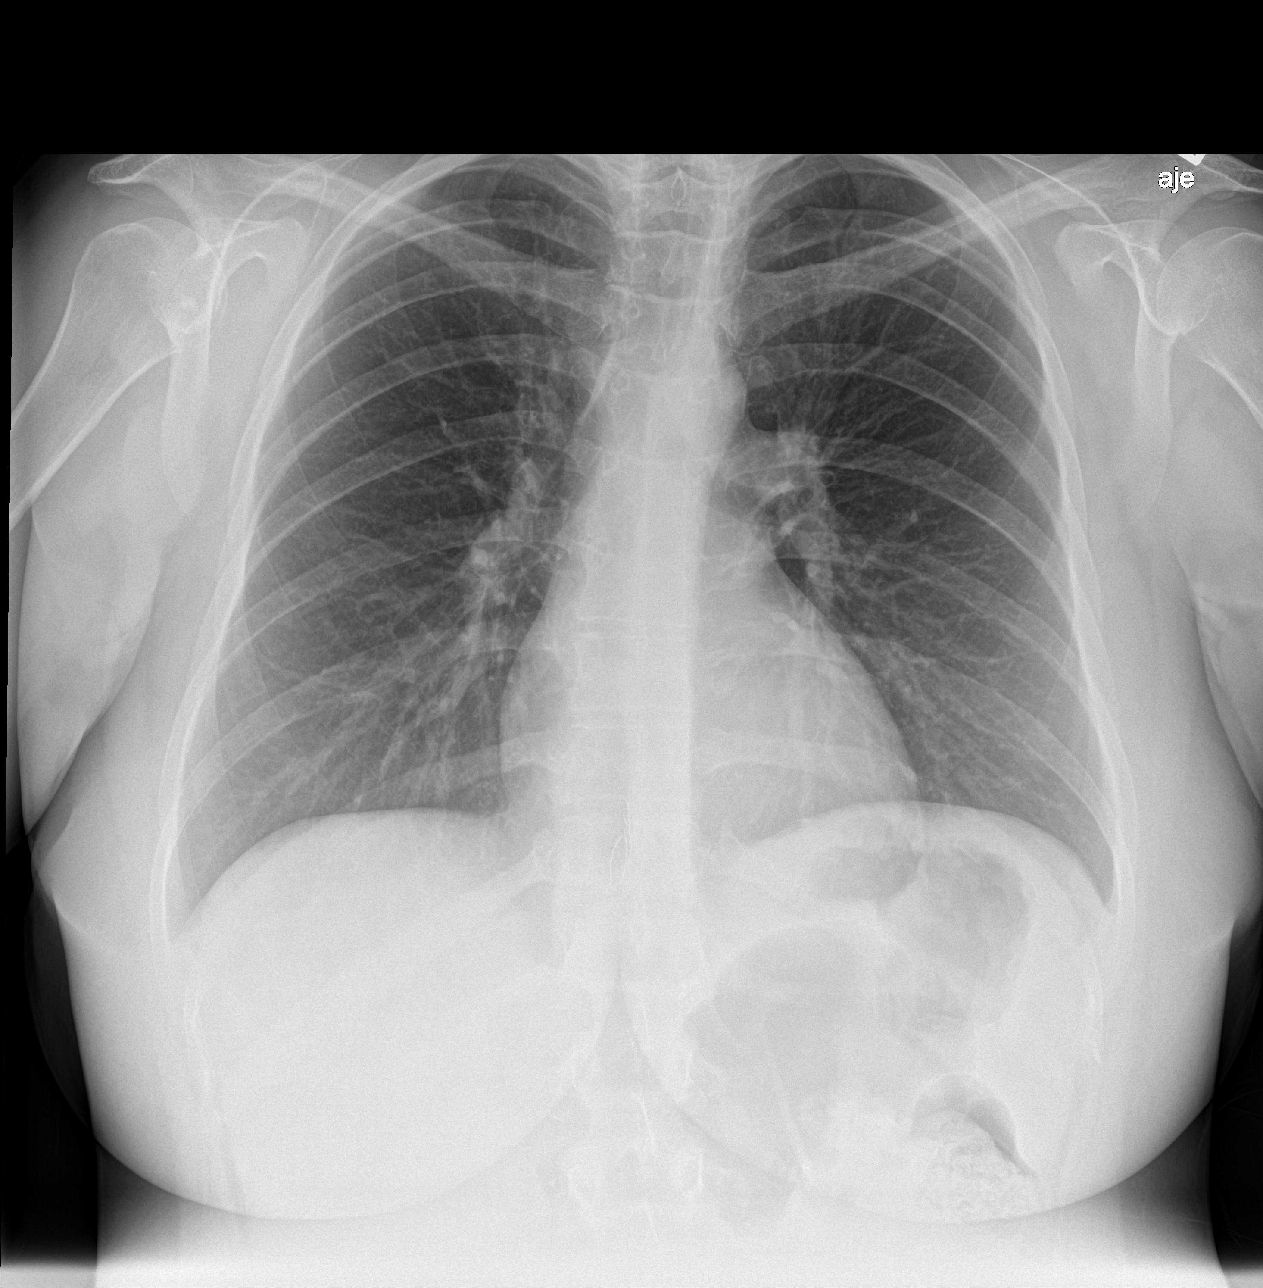

[abdomen erect]
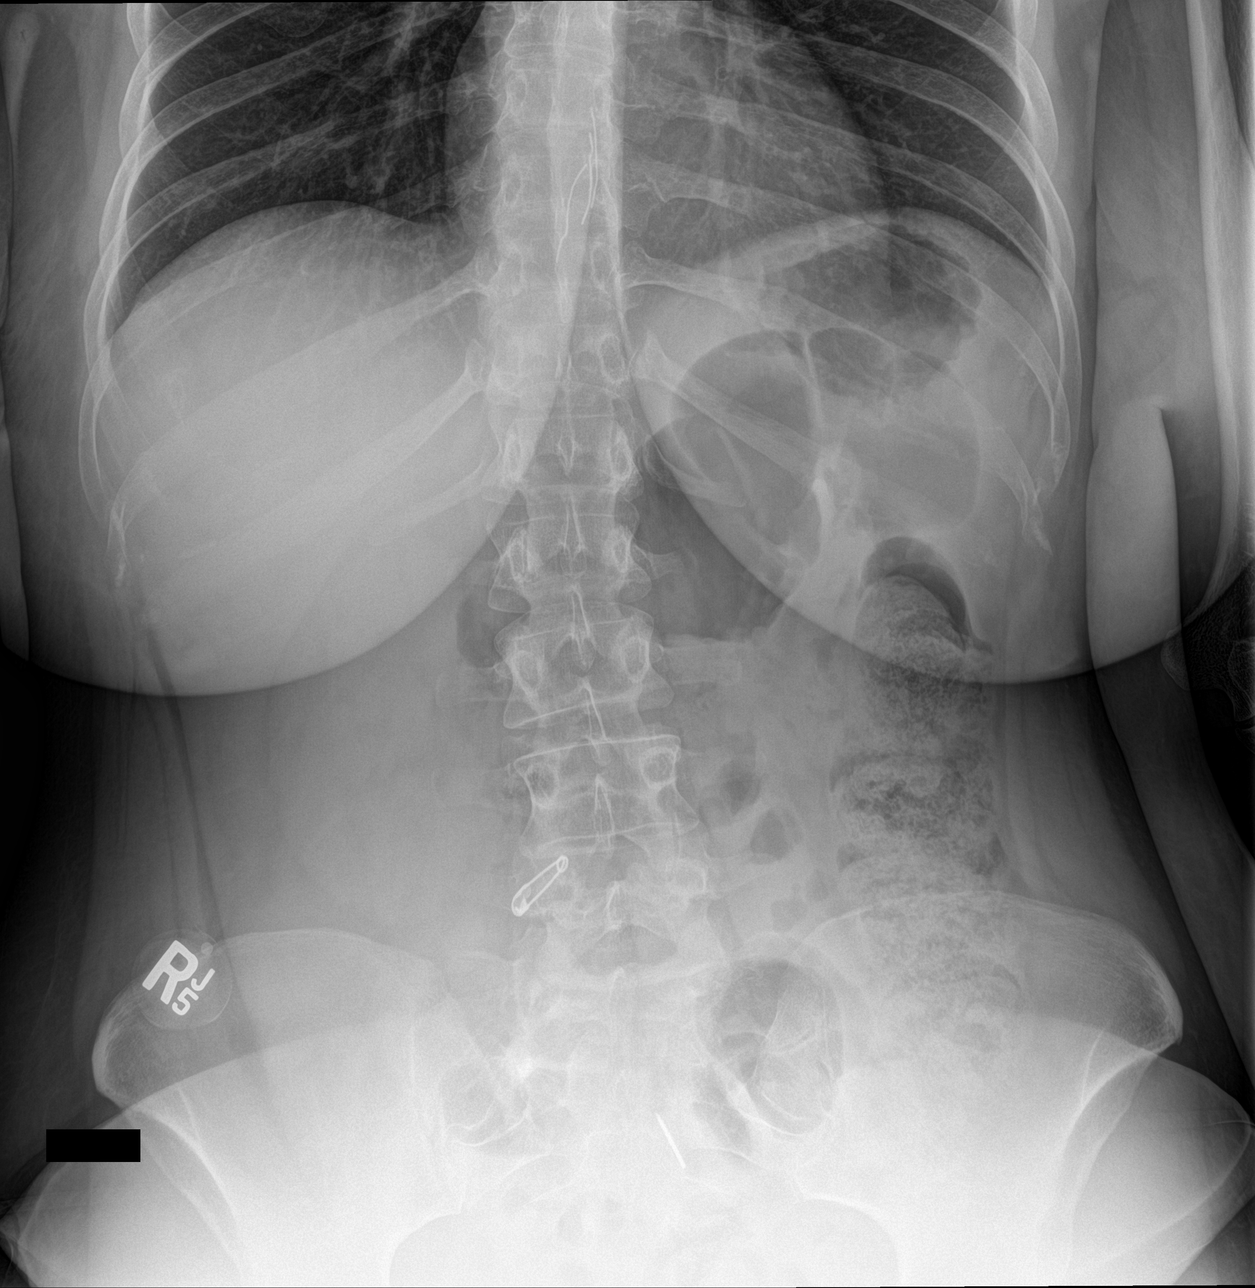

[abdomen supine]
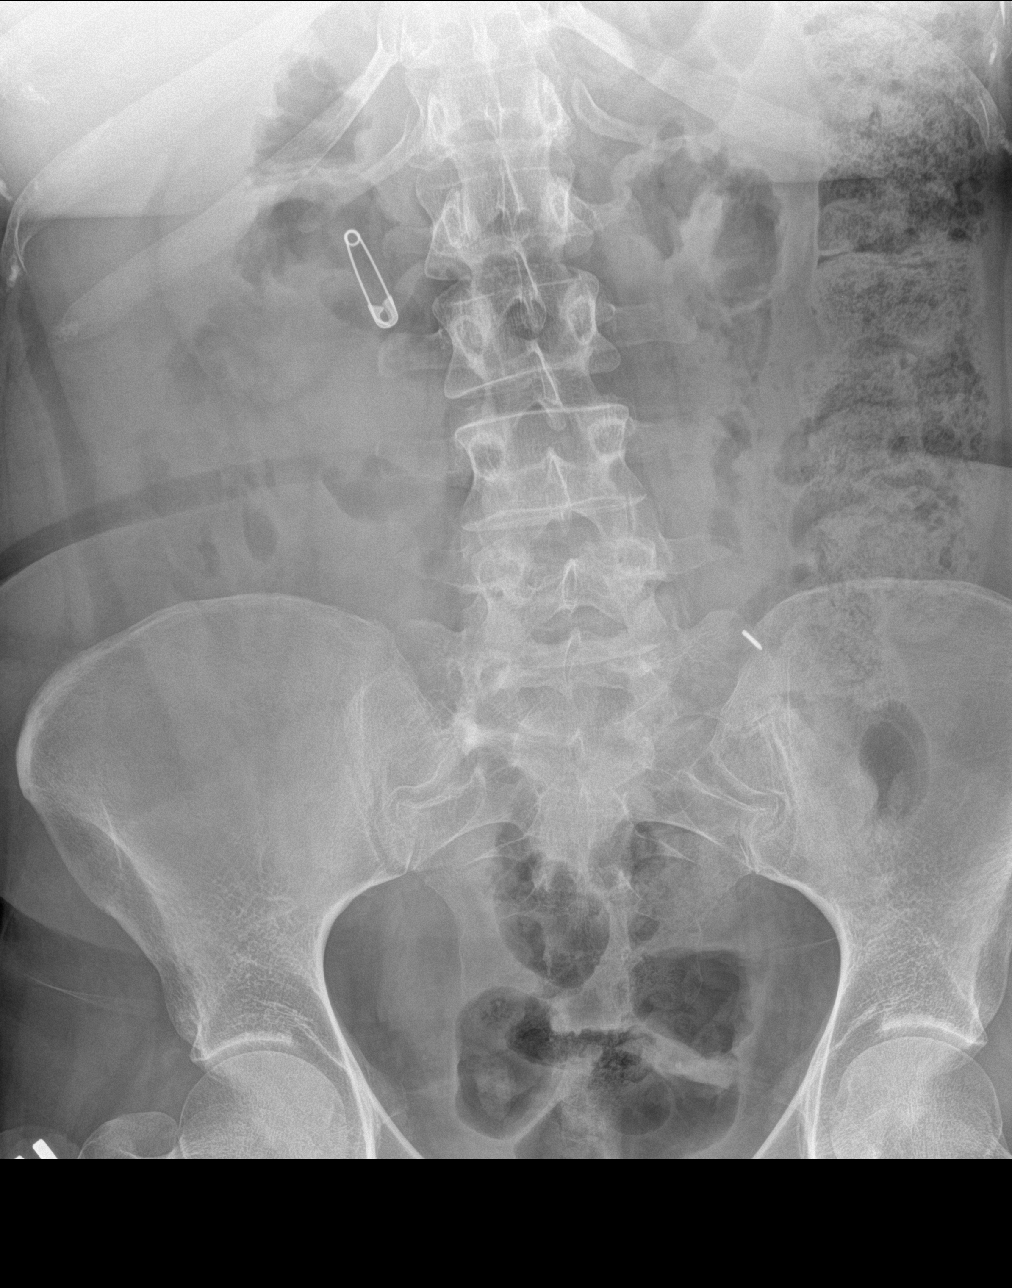

[3 of 3 positions shown; findings below may reference images not displayed]

FINDINGS: The lungs are clear wiithout focal pneumonia, edema, pneumothorax or
pleural effusion. The cardiopericardial silhouette is within normal
limits for size. A cluster of curvilinear radiodensities projects
over the midline T11 vertebral body, seen on previous study to
reside in the anterior chest wall.

Upright film shows no evidence for intraperitoneal free air. Safety
pin projects over the L4-5 interspace, suggesting small bowel
position. An additional linear radiopaque foreign body, compatible
with the reported history of ingested thumb tack is identified over
the sacrum, also likely within a small bowel loop or potentially
distal colon.
IMPRESSION: 1. Three linear radiodensities project over the midline lower chest,
seen previously did reside within the anterior chest wall.
2. Closed safety pin projects over the midline upper abdomen
compatible with positioning in the duodenum or proximal small bowel.
3. Linear radiodense foreign body projecting over the upper pelvis
would be compatible with a "thumb tack" and is also likely within
small bowel or potentially could be in the distal colon.

## 2015-11-26 NOTE — ED Notes (Addendum)
States she swolled a safety pin and thumb tack last night around 7 pm, states 2 episodes of vomiting with blood, states SI, IVC papers on way, pt from group home

## 2015-11-26 NOTE — Discharge Instructions (Signed)
Swallowed Foreign Body, Adult °A swallowed foreign body is an object that gets stuck in the tube that connects your throat to your stomach (esophagus) or in another part of your digestive tract. Foreign bodies may be swallowed by accident or on purpose. °When you swallow an object, it passes into your esophagus. The narrowest place in your digestive system is where your esophagus meets your stomach. If the object can pass through that place, it will usually continue through the rest of your digestive system without causing problems. A foreign body that gets stuck may need to be removed. °It is very important to tell your health care provider what you have swallowed. Certain swallowed items can be life-threatening. You may need emergency treatment. Dangerous swallowed foreign bodies include: °· Objects that get stuck in your throat. °· Objects that interfere with your breathing. °· Sharp objects. °· Harmful objects, such as batteries or illegal drugs. °CAUSES °The most common swallowed foreign body is food that will not pass through your esophagus to your stomach (food impaction). Foods that commonly become impacted include meats and hard vegetables, such as carrots and radishes. Other common swallowed foreign bodies include: °· Pieces of bone from meats. °· Toothpicks. °· Dentures. °RISK FACTORS °You are more likely to have a swallowed foreign body if: °· You wear dentures. °· You have been drinking alcohol or taking drugs. °· You have a mental health condition. °· You have a narrowed or scarred area in your digestive tract. °SYMPTOMS °· Pain or pressure in your throat or chest. °· Not being able to swallow food or liquid. °· Not being able to swallow your saliva. °· Choking. °· Hoarse voice. °· Trouble breathing. °DIAGNOSIS °This condition may be diagnosed based on your symptoms and medical history. Your health care provider will do a physical exam to confirm the diagnosis and to find the object. Imaging studies  may be done, including: °· X-rays. °· A CT scan. °Some objects may not be seen on imaging studies. In those cases, an exam may be done using a long tubelike scope to look into your esophagus (endoscopy). The tube (endoscope) that is used for this exam may be stiff (rigid) or flexible, depending on where the foreign body is stuck. °TREATMENT °Usually, an object that has passed into your stomach but is not dangerous will pass out of your digestive system without treatment. °If the swallowed object is not dangerous but it is stuck in your esophagus: °· You may be given medicine to relax the muscles of your esophagus to allow the object to pass through. °· Endoscopy may be done to find and remove the object if it does not pass with medicine. Your health care provider will put medical instruments through the endoscope to remove the object. °You may need emergency treatment if: °· The object is in your esophagus and is causing you to inhale saliva into your lungs (aspirate). °· The object is in your esophagus and is pressing on your airway. This makes it hard for you to breathe. °· The object can damage your digestive tract. Some objects that can cause damage include batteries, magnets, sharp objects, and drugs. °HOME CARE INSTRUCTIONS °If the object in your digestive system is expected to pass: °· Continue eating what you usually eat, unless your health care provider gives you different instructions. °· Check your stool after every bowel movement to see if you have passed the object. °· Contact your health care provider if the object has not passed after 3   days. °If you had endoscopic surgery to remove the foreign body: °· Follow instructions from your health care provider about caring for yourself after the procedure. °Keep all follow-up visits and repeat imaging tests as told by your health care provider. This is important. °SEEK MEDICAL CARE IF: °· You continue to have symptoms of a swallowed foreign body. °· The  object has not passed out of your body after 3 days. °SEEK IMMEDIATE MEDICAL CARE IF: °· You have a fever. °· You have pain in your chest or your abdomen. °· You cough up blood. °· You have blood in your stool (feces) or your vomit. °  °This information is not intended to replace advice given to you by your health care provider. Make sure you discuss any questions you have with your health care provider. °  °Document Released: 10/20/2009 Document Revised: 01/21/2015 Document Reviewed: 07/30/2014 °Elsevier Interactive Patient Education ©2016 [employer] ° °

## 2015-11-26 NOTE — ED Notes (Signed)
Pt clothes and brown bag with belongings in it stored, pt given password sheet as no next to kin is with her

## 2015-11-26 NOTE — ED Notes (Signed)
Patient transported to X-ray with Forensic scientist.

## 2015-11-26 NOTE — ED Notes (Signed)
Psych MD at bedside

## 2015-11-26 NOTE — ED Notes (Signed)
Spoke with frances from group home, informed her pt ok for discharge and will pass safety pin so safe to DC. She will come pick pt up.

## 2015-11-26 NOTE — ED Notes (Signed)
Pt given meal tray.
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
## 2015-11-26 NOTE — BH Assessment (Signed)
Tele Assessment Note   Melanie Bell is an 25 y.o. female. Pt reports SI. Pt swallowed a safety pin and a thumb tac in an attempt to harm herself. Pt admits to multiple SI attempts by swallowing foreign objects. Pt states she is stressed at her group home [employer]. Pt is developmentally delayed. Pt has been diagnosed with depression and anxiety. Pt is prescribed Latuda, Prolexin, and Effexor. Pt's guardian is her mother. Per Pt her mother resides in [place]. Pt has multiple inpatient hospitalizations at [facility] for SI and swallowing foreign objects. Pt has an ACTT with [facility]. Pt denies SA. Pt denies abuse.   Writer consulted with Heloise Purpura, DNP. Per Heloise Purpura Pt meets inpatient criteria. TTS to seek placement.  Diagnosis:  F33.2 MDD, recurrent, severe  Past Medical History:  Past Medical History  Diagnosis Date  . Hallucinations 09/30/2014    Sizoaffective  . Depression   . Anxiety   . Hypertension   . Tardive dyskinesia 10/2014    recent onset  . GERD (gastroesophageal reflux disease)   . Hyperlipidemia     Past Surgical History  Procedure Laterality Date  . Wisdom tooth extraction    . Esophagogastroduodenoscopy N/A 11/28/2014    Procedure: ESOPHAGOGASTRODUODENOSCOPY (EGD);  Surgeon: [name], MD;  Location: [facility] ENDOSCOPY;  Service: Endoscopy;  Laterality: N/A;  . Abdominal surgery      "years ago" to remove foreign objects  . Breast lumpectomy Right   . Appendectomy    . Colonoscopy with propofol N/A 09/10/2015    Procedure: COLONOSCOPY WITH PROPOFOL;  Surgeon: [name], MD;  Location: [facility] ENDOSCOPY;  Service: Endoscopy;  Laterality: N/A;  . Laparotomy N/A 09/12/2015    Procedure: EXPLORATORY LAPAROTOMY;  Surgeon: Florene Glen, MD;  Location: [facility] ORS;  Service: General;  Laterality: N/A;  . Sigmoidoscopy N/A 09/12/2015    Procedure: Lonell Face;  Surgeon: Florene Glen, MD;  Location: [facility] ORS;  Service: General;  Laterality: N/A;    Family History:   Family History  Problem Relation Age of Onset  . Depression Mother   . Hypertension Mother     Social History:  reports that she has been smoking Cigarettes.  She has a 1.5 pack-year smoking history. She has never used smokeless tobacco. She reports that she does not drink alcohol or use illicit drugs.  Additional Social History:  Alcohol / Drug Use Pain Medications: Pt denies Prescriptions: Latuda, Prolexin, Effexor Over the Counter: Pt denies History of alcohol / drug use?: No history of alcohol / drug abuse Longest period of sobriety (when/how long): NA  CIWA: CIWA-Ar BP: 128/75 mmHg Pulse Rate: 85 COWS:    PATIENT STRENGTHS: (choose at least two) Communication skills Supportive family/friends  Allergies:  Allergies  Allergen Reactions  . Betadine [Povidone Iodine] Other (See Comments)    Reaction:  Unknown   . Shellfish-Derived Products Other (See Comments)    Reaction:  Unknown   . Iodine Rash    Home Medications:  (Not in a hospital admission)  OB/GYN Status:  Patient's last menstrual period was 11/19/2015.  General Assessment Data Location of Assessment: [facility] ED TTS Assessment: In system Is this a Tele or Face-to-Face Assessment?: Tele Assessment Is this an Initial Assessment or a Re-assessment for this encounter?: Initial Assessment Marital status: Single Maiden name: NA Is patient pregnant?: No Pregnancy Status: No Living Arrangements: Group Home Can pt return to current living arrangement?: Yes Admission Status: Involuntary Is patient capable of signing voluntary admission?: Yes Referral Source:  Self/Family/Friend Insurance type: Medicaid     Crisis Care Plan Living Arrangements: Group Home Legal Guardian: Mother Name of Psychiatrist: [facility] Name of Therapist: [facility]  Education Status Is patient currently in school?: No Current Grade: NA Highest grade of school patient has completed: GED Name of school: NA Contact person: NA  Risk to self  with the past 6 months Suicidal Ideation: Yes-Currently Present Has patient been a risk to self within the past 6 months prior to admission? : Yes Suicidal Intent: Yes-Currently Present Has patient had any suicidal intent within the past 6 months prior to admission? : Yes Is patient at risk for suicide?: Yes Suicidal Plan?: Yes-Currently Present Has patient had any suicidal plan within the past 6 months prior to admission? : Yes Specify Current Suicidal Plan: Foreighn object in body Access to Means: Yes Specify Access to Suicidal Means: Access to safety pin and thumb tac What has been your use of drugs/alcohol within the last 12 months?: NA Previous Attempts/Gestures: Yes How many times?: 5 Other Self Harm Risks: swallowing items Triggers for Past Attempts: Hallucinations Intentional Self Injurious Behavior: None Family Suicide History: No Recent stressful life event(s): Conflict (Comment) (conflict with someone in group home) Persecutory voices/beliefs?: Yes Depression: Yes Depression Symptoms: Loss of interest in usual pleasures, Feeling worthless/self pity, Feeling angry/irritable, Isolating, Tearfulness, Insomnia Substance abuse history and/or treatment for substance abuse?: No Suicide prevention information given to non-admitted patients: Not applicable  Risk to Others within the past 6 months Homicidal Ideation: No Does patient have any lifetime risk of violence toward others beyond the six months prior to admission? : No Thoughts of Harm to Others: No Current Homicidal Intent: No Current Homicidal Plan: No Access to Homicidal Means: No Identified Victim: NA History of harm to others?: No Assessment of Violence: None Noted Violent Behavior Description: NA Does patient have access to weapons?: No Criminal Charges Pending?: No Does patient have a court date: No Is patient on probation?: No  Psychosis Hallucinations: None noted Delusions: None noted  Mental Status  Report Appearance/Hygiene: In scrubs, Unremarkable, In hospital gown Eye Contact: Fair Motor Activity: Freedom of movement Speech: Logical/coherent Level of Consciousness: Alert Mood: Depressed Affect: Appropriate to circumstance, Depressed Anxiety Level: Minimal Thought Processes: Coherent, Relevant Judgement: Impaired Orientation: Person, Place, Time, Situation, Appropriate for developmental age Obsessive Compulsive Thoughts/Behaviors: None  Cognitive Functioning Concentration: Normal Memory: Recent Intact, Remote Intact IQ: Average Insight: Poor Impulse Control: Poor Appetite: Poor Weight Loss: 0 Weight Gain: 0 Sleep: No Change Total Hours of Sleep: 8 Vegetative Symptoms: None  ADLScreening [facility] Assessment Services) Patient's cognitive ability adequate to safely complete daily activities?: Yes Patient able to express need for assistance with ADLs?: Yes Independently performs ADLs?: Yes (appropriate for developmental age)  Prior Inpatient Therapy Prior Inpatient Therapy: Yes Prior Therapy Dates: Multiple Admissions Prior Therapy Facilty/Provider(s): [place], [facility] Reason for Treatment: Psychosis  Prior Outpatient Therapy Prior Outpatient Therapy: Yes Prior Therapy Dates: Current Prior Therapy Facilty/Provider(s): [facility] Reason for Treatment: Psychosis Does patient have an ACCT team?: Yes Does patient have Intensive In-House Services?  : No Does patient have [facility] services? : No Does patient have P4CC services?: No  ADL Screening (condition at time of admission) Patient's cognitive ability adequate to safely complete daily activities?: Yes Is the patient deaf or have difficulty hearing?: No Does the patient have difficulty seeing, even when wearing glasses/contacts?: No Patient able to express need for assistance with ADLs?: Yes Does the patient have difficulty dressing or bathing?: No Independently performs  ADLs?: Yes (appropriate for developmental age) Does the  patient have difficulty walking or climbing stairs?: No Weakness of Legs: None Weakness of Arms/Hands: None       Abuse/Neglect Assessment (Assessment to be complete while patient is alone) Physical Abuse: Denies Verbal Abuse: Denies Sexual Abuse: Denies Exploitation of patient/patient's resources: Denies Self-Neglect: Denies Values / Beliefs Cultural Requests During Hospitalization: None Spiritual Requests During Hospitalization: None   Advance Directives (For Healthcare) Does patient have an advance directive?: No Would patient like information on creating an advanced directive?: No - patient declined information    Additional Information 1:1 In Past 12 Months?: No CIRT Risk: No Elopement Risk: No Does patient have medical clearance?: Yes     Disposition:  Disposition Initial Assessment Completed for this Encounter: Yes Disposition of Patient: Inpatient treatment program Type of inpatient treatment program: Adult  Hanna Ra D 11/26/2015 10:51 AM

## 2015-11-26 NOTE — ED Notes (Signed)
Brought in under  IVC  States she swallowed safety  pin and thumb tack

## 2015-11-26 NOTE — ED Notes (Signed)
Patient transported to Ultrasound 

## 2015-11-26 NOTE — ED Provider Notes (Signed)
[facility] Emergency Department Provider Note        Time seen: ----------------------------------------- 9:06 AM on 11/26/2015 -----------------------------------------    I have reviewed the triage vital signs and the nursing notes.   HISTORY  Chief Complaint Foreign Body and Suicide Attempt    HPI Melanie Bell is a 25 y.o. female who presents to the ER for an ingestion to harm herself. According to the report she swallowed a safety pin and thumb tack last night around 7 PM. Patient reports 2 episodes of vomiting with blood. Patient states she got into an argument with someone at the group home. She denies other complaints at this time.   Past Medical History  Diagnosis Date  . Hallucinations 09/30/2014    Sizoaffective  . Depression   . Anxiety   . Hypertension   . Tardive dyskinesia 10/2014    recent onset  . GERD (gastroesophageal reflux disease)   . Hyperlipidemia     Patient Active Problem List   Diagnosis Date Noted  . Foreign body ingestion 07/31/2015  . Schizoaffective disorder, bipolar type ([place]) 11/06/2014  . Tobacco use disorder 09/30/2014  . Hypothyroidism 09/29/2014  . Hypertension 09/29/2014  . Constipation 09/29/2014    Past Surgical History  Procedure Laterality Date  . Wisdom tooth extraction    . Esophagogastroduodenoscopy N/A 11/28/2014    Procedure: ESOPHAGOGASTRODUODENOSCOPY (EGD);  Surgeon: [name], MD;  Location: [facility] ENDOSCOPY;  Service: Endoscopy;  Laterality: N/A;  . Abdominal surgery      "years ago" to remove foreign objects  . Breast lumpectomy Right   . Appendectomy    . Colonoscopy with propofol N/A 09/10/2015    Procedure: COLONOSCOPY WITH PROPOFOL;  Surgeon: [name], MD;  Location: [facility] ENDOSCOPY;  Service: Endoscopy;  Laterality: N/A;  . Laparotomy N/A 09/12/2015    Procedure: EXPLORATORY LAPAROTOMY;  Surgeon: Florene Glen, MD;  Location: [facility] ORS;  Service: General;   Laterality: N/A;  . Sigmoidoscopy N/A 09/12/2015    Procedure: Lonell Face;  Surgeon: Florene Glen, MD;  Location: [facility] ORS;  Service: General;  Laterality: N/A;    Allergies Betadine; Shellfish-derived products; and Iodine  Social History Social History  Substance Use Topics  . Smoking status: Current Every Day Smoker -- 0.50 packs/day for 3 years    Types: Cigarettes  . Smokeless tobacco: Never Used  . Alcohol Use: No    Review of Systems Constitutional: Negative for fever. Eyes: Negative for visual changes. ENT: Negative for sore throat. Cardiovascular: Negative for chest pain. Respiratory: Negative for shortness of breath. Gastrointestinal: Negative for abdominal pain, positive for vomiting Genitourinary: Negative for dysuria. Musculoskeletal: Negative for back pain. Skin: Negative for rash. Neurological: Negative for headaches, focal weakness or numbness. Psychiatric: Positive for suicidal ideation  10-point ROS otherwise negative.  ____________________________________________   PHYSICAL EXAM:  VITAL SIGNS: ED Triage Vitals  Enc Vitals Group     BP 11/26/15 0832 128/75 mmHg     Pulse Rate 11/26/15 0832 85     Resp 11/26/15 0832 18     Temp 11/26/15 0832 98.9 F (37.2 C)     Temp Source 11/26/15 0832 Oral     SpO2 11/26/15 0832 98 %     Weight 11/26/15 0832 194 lb (87.998 kg)     Height 11/26/15 0832 5\' 6"  (1.676 m)     Head Cir --      Peak Flow --      Pain Score 11/26/15 0835 5  Pain Loc --      Pain Edu? --      Excl. in [place]? --     Constitutional: Alert and oriented. Well appearing and in no distress. Eyes: Conjunctivae are normal. PERRL. Normal extraocular movements. ENT   Head: Normocephalic and atraumatic.   Nose: No congestion/rhinnorhea.   Mouth/Throat: Mucous membranes are moist.   Neck: No stridor. Cardiovascular: Normal rate, regular rhythm. No murmurs, rubs, or gallops. Respiratory: Normal respiratory effort without  tachypnea nor retractions. Breath sounds are clear and equal bilaterally. No wheezes/rales/rhonchi. Gastrointestinal: Soft and nontender. Normal bowel sounds Musculoskeletal: Nontender with normal range of motion in all extremities. No lower extremity tenderness nor edema. Neurologic:  Normal speech and language. No gross focal neurologic deficits are appreciated.  Skin:  Skin is warm, dry and intact. No rash noted. Psychiatric: Mood and affect are normal. Speech and behavior are normal.  ____________________________________________  ED COURSE:  Pertinent labs & imaging results that were available during my care of the patient were reviewed by me and considered in my medical decision making (see chart for details). Patient presents to the ER with a long history of ingestion. We will assess with basic imaging and labs. ____________________________________________    LABS (pertinent positives/negatives)  Labs Reviewed  COMPREHENSIVE METABOLIC PANEL - Abnormal; Notable for the following:    Chloride 112 (*)    ALT 7 (*)    All other components within normal limits  ACETAMINOPHEN LEVEL - Abnormal; Notable for the following:    Acetaminophen (Tylenol), Serum <10 (*)    All other components within normal limits  ETHANOL  SALICYLATE LEVEL  CBC  URINE DRUG SCREEN, QUALITATIVE ([facility] ONLY)  POC URINE PREG, ED  POCT PREGNANCY, URINE    RADIOLOGY Images were viewed by me  Acute abdominal series IMPRESSION: 1. Three linear radiodensities project over the midline lower chest, seen previously did reside within the anterior chest wall. 2. Closed safety pin projects over the midline upper abdomen compatible with positioning in the duodenum or proximal small bowel. 3. Linear radiodense foreign body projecting over the upper pelvis would be compatible with a "thumb tack" and is also likely within small bowel or potentially could be in the distal  colon.  ____________________________________________  FINAL ASSESSMENT AND PLAN  Foreign body ingestion  Plan: Patient with labs and imaging as dictated above. Patient is not felt to be a threat to herself or others at this time. Foreign bodies should pass without difficulty. Patient will be advised to follow-up for repeat imaging in 24 hours. She has been cleared by psychiatry for discharge.   Earleen Newport, MD   Note: This dictation was prepared with Dragon dictation. Any transcriptional errors that result from this process are unintentional   Earleen Newport, MD 11/26/15 1151

## 2015-12-12 ENCOUNTER — Emergency Department
Admission: EM | Admit: 2015-12-12 | Discharge: 2015-12-12 | Disposition: A | Discharge status: Home / Self Care | Payer: Medicaid Other | Attending: Emergency Medicine | Admitting: Emergency Medicine

## 2015-12-12 ENCOUNTER — Emergency Department: Payer: Medicaid Other

## 2015-12-12 ENCOUNTER — Encounter: Payer: Self-pay | Admitting: *Deleted

## 2015-12-12 DIAGNOSIS — Y939 Activity, unspecified: Secondary | ICD-10-CM | POA: Diagnosis not present

## 2015-12-12 DIAGNOSIS — X58XXXA Exposure to other specified factors, initial encounter: Secondary | ICD-10-CM | POA: Diagnosis not present

## 2015-12-12 DIAGNOSIS — Z79899 Other long term (current) drug therapy: Secondary | ICD-10-CM | POA: Insufficient documentation

## 2015-12-12 DIAGNOSIS — F17210 Nicotine dependence, cigarettes, uncomplicated: Secondary | ICD-10-CM | POA: Diagnosis not present

## 2015-12-12 DIAGNOSIS — I10 Essential (primary) hypertension: Secondary | ICD-10-CM | POA: Diagnosis not present

## 2015-12-12 DIAGNOSIS — E785 Hyperlipidemia, unspecified: Secondary | ICD-10-CM | POA: Insufficient documentation

## 2015-12-12 DIAGNOSIS — T189XXA Foreign body of alimentary tract, part unspecified, initial encounter: Secondary | ICD-10-CM | POA: Diagnosis present

## 2015-12-12 DIAGNOSIS — F329 Major depressive disorder, single episode, unspecified: Secondary | ICD-10-CM | POA: Insufficient documentation

## 2015-12-12 DIAGNOSIS — Y929 Unspecified place or not applicable: Secondary | ICD-10-CM | POA: Diagnosis not present

## 2015-12-12 DIAGNOSIS — F250 Schizoaffective disorder, bipolar type: Secondary | ICD-10-CM | POA: Diagnosis not present

## 2015-12-12 DIAGNOSIS — Y999 Unspecified external cause status: Secondary | ICD-10-CM | POA: Insufficient documentation

## 2015-12-12 LAB — COMPREHENSIVE METABOLIC PANEL
ALK PHOS: 85 U/L (ref 38–126)
ALT: 7 U/L — AB (ref 14–54)
AST: 17 U/L (ref 15–41)
Albumin: 4.2 g/dL (ref 3.5–5.0)
Anion gap: 8 (ref 5–15)
BUN: 13 mg/dL (ref 6–20)
CALCIUM: 9.3 mg/dL (ref 8.9–10.3)
CO2: 23 mmol/L (ref 22–32)
CREATININE: 0.77 mg/dL (ref 0.44–1.00)
Chloride: 107 mmol/L (ref 101–111)
Glucose, Bld: 87 mg/dL (ref 65–99)
Potassium: 4.2 mmol/L (ref 3.5–5.1)
SODIUM: 138 mmol/L (ref 135–145)
Total Bilirubin: 0.3 mg/dL (ref 0.3–1.2)
Total Protein: 7.4 g/dL (ref 6.5–8.1)

## 2015-12-12 LAB — URINE DRUG SCREEN, QUALITATIVE (ARMC ONLY)
Amphetamines, Ur Screen: NOT DETECTED
BARBITURATES, UR SCREEN: NOT DETECTED
Benzodiazepine, Ur Scrn: NOT DETECTED
CANNABINOID 50 NG, UR ~~LOC~~: NOT DETECTED
COCAINE METABOLITE, UR ~~LOC~~: NOT DETECTED
MDMA (Ecstasy)Ur Screen: NOT DETECTED
Methadone Scn, Ur: NOT DETECTED
OPIATE, UR SCREEN: NOT DETECTED
PHENCYCLIDINE (PCP) UR S: NOT DETECTED
TRICYCLIC, UR SCREEN: NOT DETECTED

## 2015-12-12 LAB — CBC
HCT: 38.2 % (ref 35.0–47.0)
HEMOGLOBIN: 13.4 g/dL (ref 12.0–16.0)
MCH: 31.5 pg (ref 26.0–34.0)
MCHC: 35 g/dL (ref 32.0–36.0)
MCV: 90 fL (ref 80.0–100.0)
Platelets: 194 10*3/uL (ref 150–440)
RBC: 4.25 MIL/uL (ref 3.80–5.20)
RDW: 13.3 % (ref 11.5–14.5)
WBC: 7.7 10*3/uL (ref 3.6–11.0)

## 2015-12-12 LAB — SALICYLATE LEVEL

## 2015-12-12 LAB — POCT PREGNANCY, URINE: Preg Test, Ur: NEGATIVE

## 2015-12-12 LAB — ACETAMINOPHEN LEVEL: Acetaminophen (Tylenol), Serum: <10 ug/mL — ABNORMAL LOW (ref 10–30)

## 2015-12-12 LAB — ETHANOL: Alcohol, Ethyl (B): <5 mg/dL (ref <5)

## 2015-12-12 IMAGING — CR DG CHEST 2V
1 series · 2 of 2 positions shown · non-contrast
Comparison: [DATE]

CLINICAL DATA: Swallowed a AA battery this evening.

EXAM:
CHEST  2 VIEW

[Series 1: dg chest 2 view · 0.14mm/px · 2 of 2 slices shown]
[im 1/2]
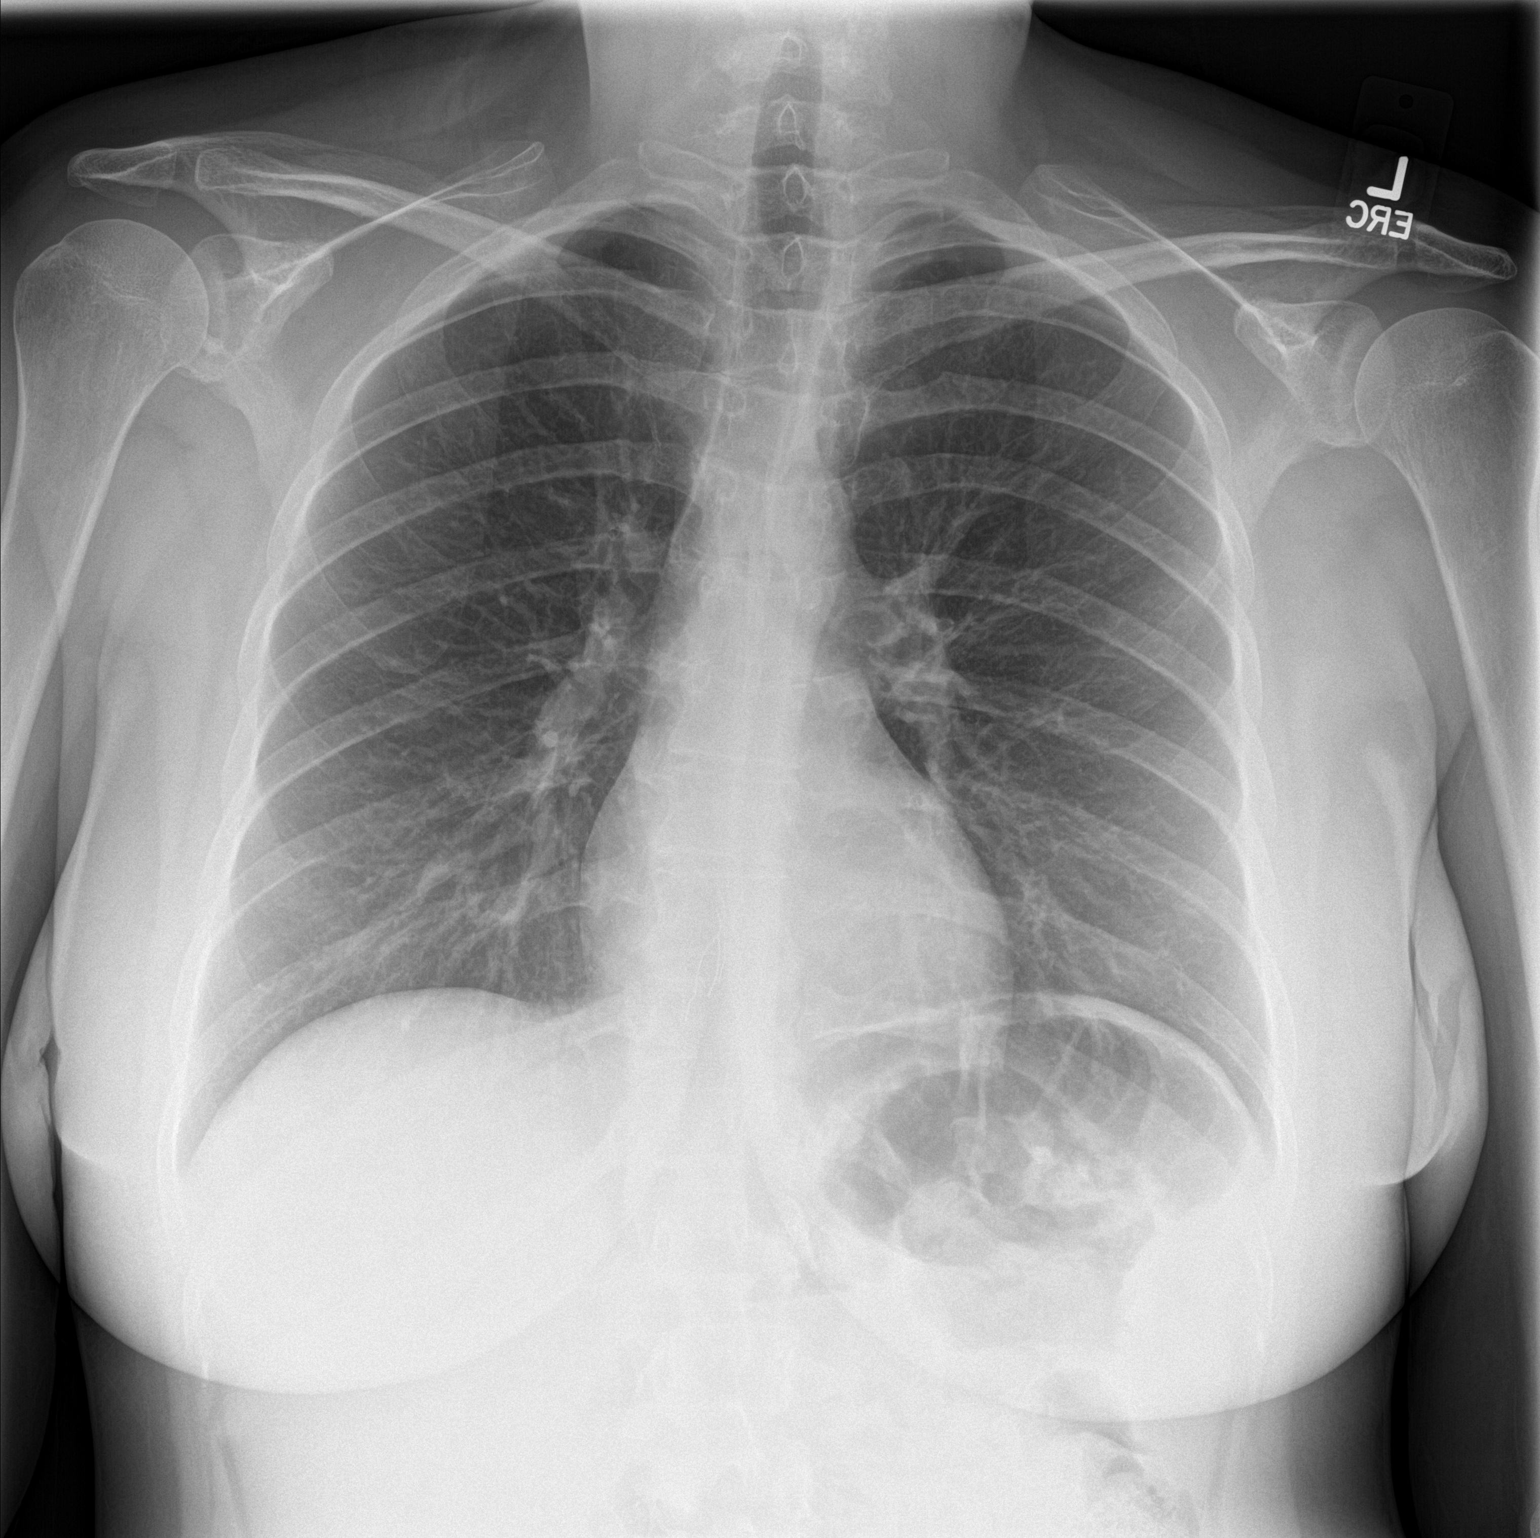
[im 2/2]
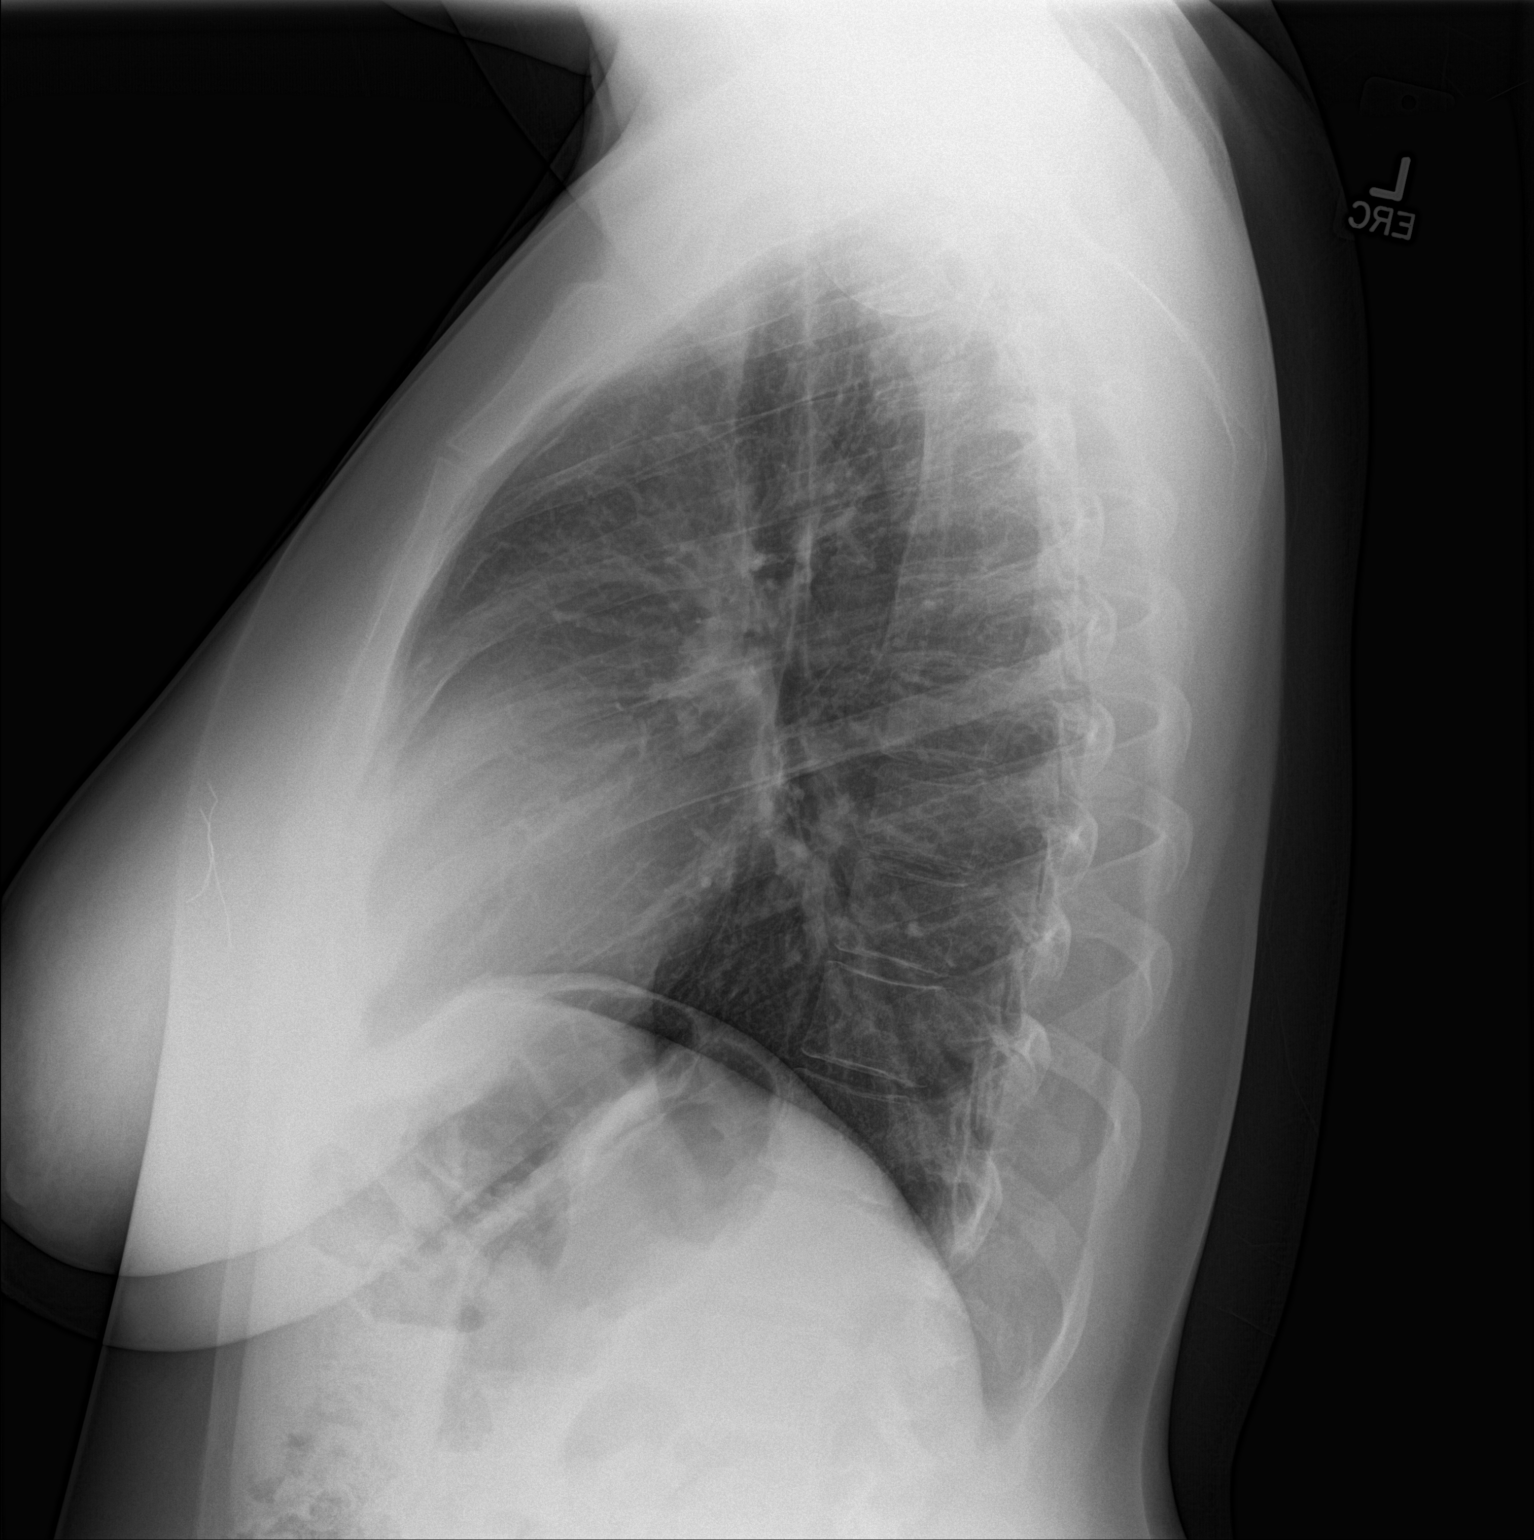

[2 of 2 positions shown; findings below may reference images not displayed]

FINDINGS: The cardiomediastinal silhouette is within normal limits. The lungs
are well inflated and clear. There is no evidence of pleural
effusion or pneumothorax. No acute osseous abnormality is
identified. Thin curvilinear metallic densities in the midline
anterior chest wall are unchanged. No battery is seen in the chest.
IMPRESSION: No evidence of new radiopaque foreign body in the chest.

## 2015-12-12 IMAGING — CR DG ABDOMEN 1V
1 series · 2 of 2 positions shown · non-contrast
Comparison: [DATE]

CLINICAL DATA: Swallowed a AA battery this evening. No reported
symptoms.

EXAM:
ABDOMEN - 1 VIEW

[Series 1: dg abd 1 view · 0.14mm/px · 2 of 2 slices shown]
[im 1/2]
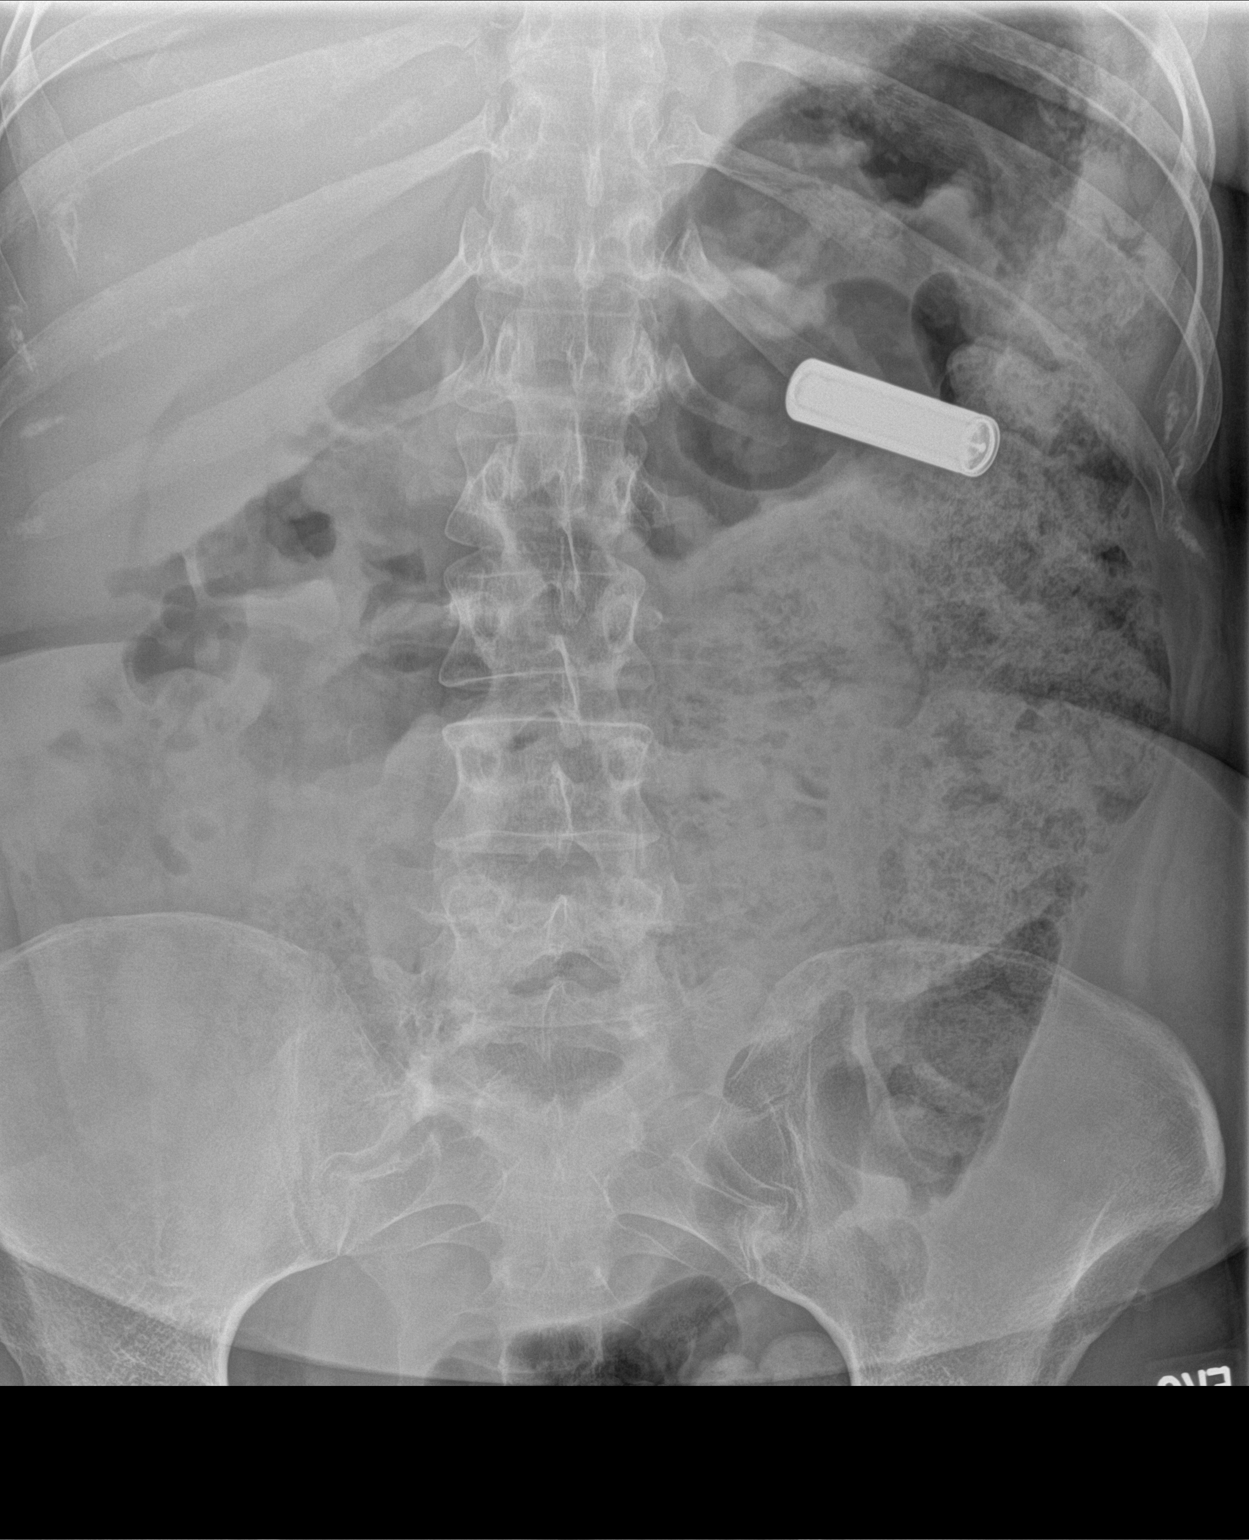
[im 2/2]
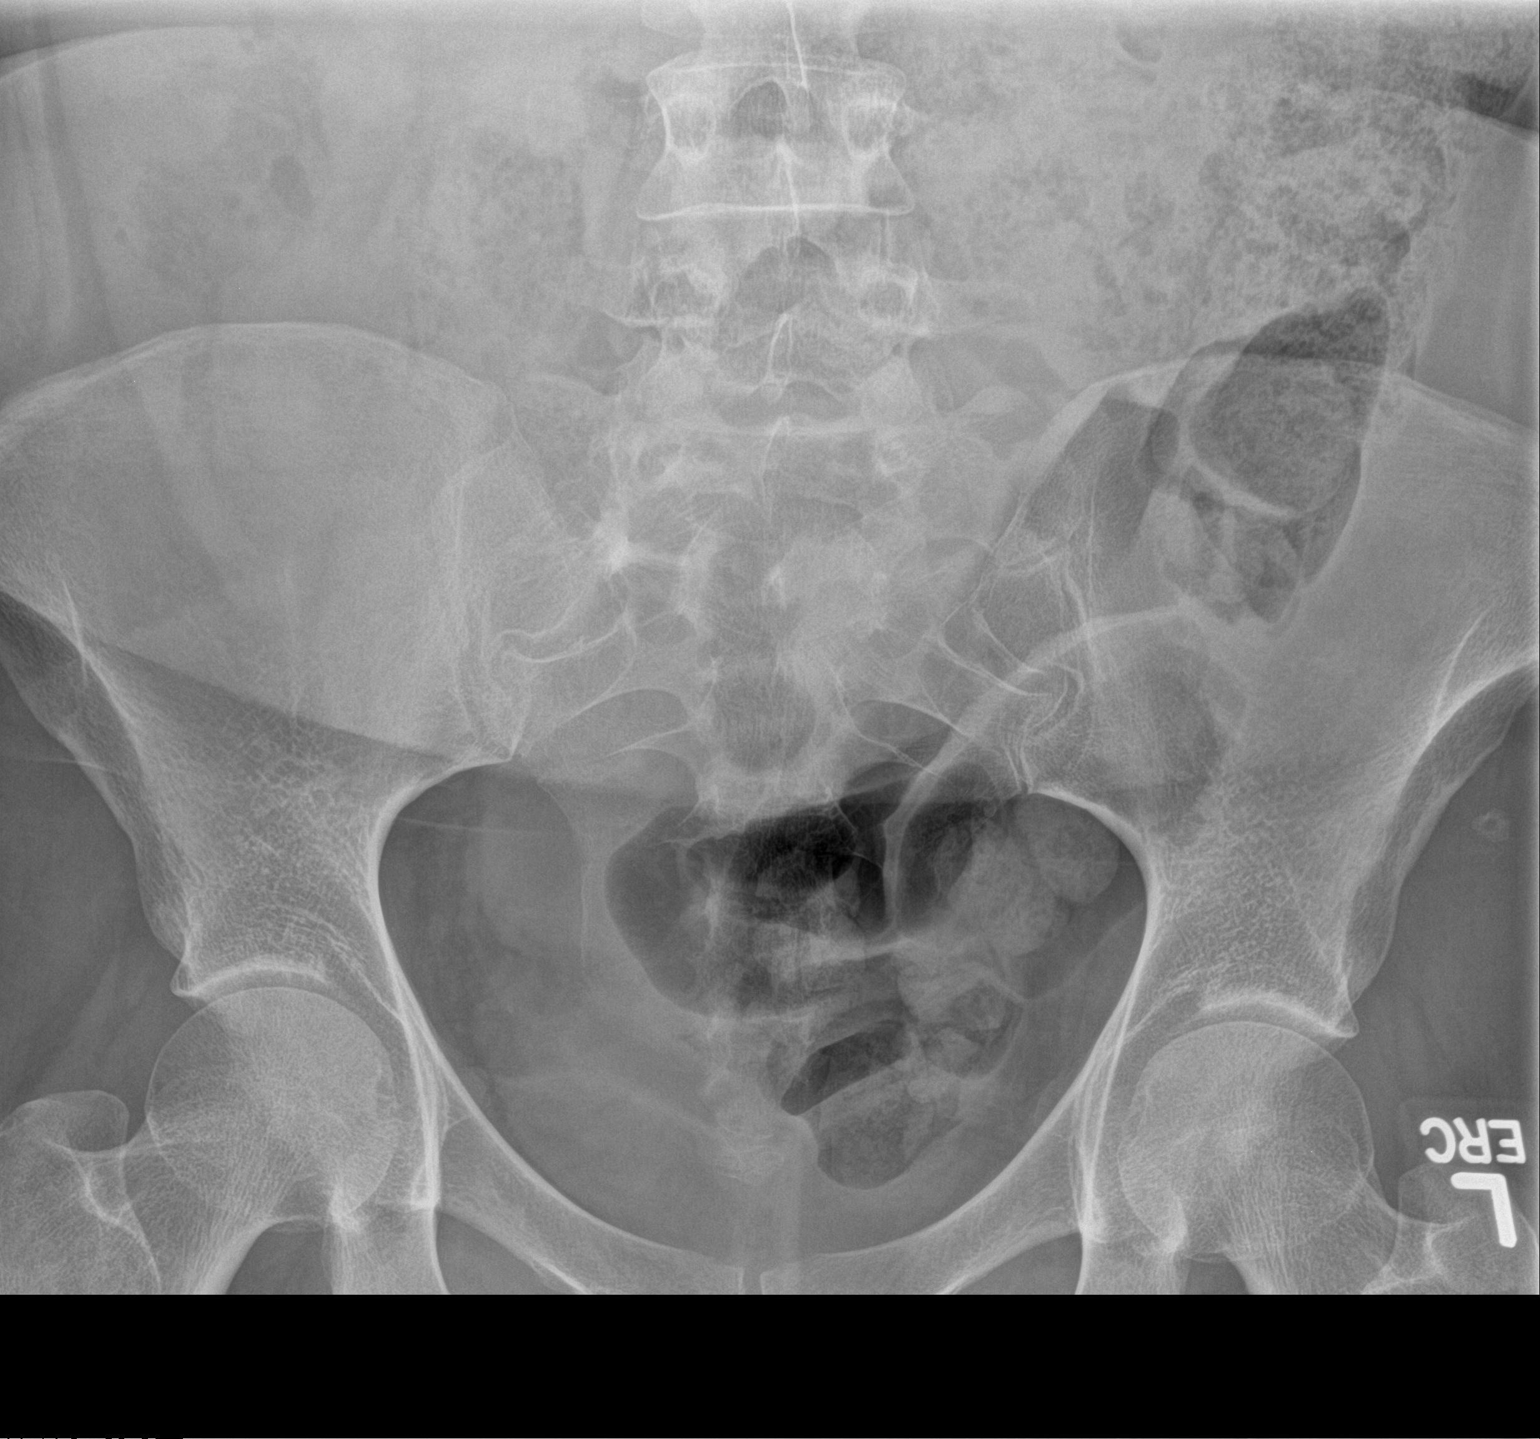

[2 of 2 positions shown; findings below may reference images not displayed]

FINDINGS: A battery projects over the left upper quadrant, most likely in the
stomach. There is a large amount of stool in the colon. There is no
evidence of bowel obstruction. The safety pin and presumed thumb
tack on the prior study are no longer identified. No acute osseous
abnormality is identified.
IMPRESSION: 1. Battery in the left upper quadrant, likely in the stomach.
2. Interval passage of other foreign bodies on the prior study.
3. Large amount of colonic stool.

## 2015-12-12 NOTE — ED Provider Notes (Signed)
[facility] Emergency Department Provider Note        Time seen: ----------------------------------------- 9:48 PM on 12/12/2015 -----------------------------------------    I have reviewed the triage vital signs and the nursing notes.   HISTORY  Chief Complaint Suicidal and Swallowed Foreign Body    HPI Melanie Bell is a 25 y.o. female who presents to the ER after she states she swallowed a double a battery. She has a long history of ingesting foreign bodies. She was recently seen by me after multiple episodes of this. She has no complaints this time, states she wants to hurt herself. These feelings are chronic. Again she is well-known for this behavior previously.   Past Medical History:  Diagnosis Date  . Anxiety   . Depression   . GERD (gastroesophageal reflux disease)   . Hallucinations 09/30/2014   Sizoaffective  . Hyperlipidemia   . Hypertension   . Tardive dyskinesia 10/2014   recent onset    Patient Active Problem List   Diagnosis Date Noted  . Foreign body ingestion 07/31/2015  . Schizoaffective disorder, bipolar type ([place]) 11/06/2014  . Tobacco use disorder 09/30/2014  . Hypothyroidism 09/29/2014  . Hypertension 09/29/2014  . Constipation 09/29/2014    Past Surgical History:  Procedure Laterality Date  . ABDOMINAL SURGERY     "years ago" to remove foreign objects  . APPENDECTOMY    . BREAST LUMPECTOMY Right   . COLONOSCOPY WITH PROPOFOL N/A 09/10/2015   Procedure: COLONOSCOPY WITH PROPOFOL;  Surgeon: [name], MD;  Location: [facility] ENDOSCOPY;  Service: Endoscopy;  Laterality: N/A;  . ESOPHAGOGASTRODUODENOSCOPY N/A 11/28/2014   Procedure: ESOPHAGOGASTRODUODENOSCOPY (EGD);  Surgeon: [name], MD;  Location: [facility] ENDOSCOPY;  Service: Endoscopy;  Laterality: N/A;  . LAPAROTOMY N/A 09/12/2015   Procedure: EXPLORATORY LAPAROTOMY;  Surgeon: Florene Glen, MD;  Location: [facility] ORS;  Service: General;  Laterality:  N/A;  . SIGMOIDOSCOPY N/A 09/12/2015   Procedure: Lonell Face;  Surgeon: Florene Glen, MD;  Location: [facility] ORS;  Service: General;  Laterality: N/A;  . WISDOM TOOTH EXTRACTION      Allergies Betadine [povidone iodine]; Shellfish-derived products; and Iodine  Social History Social History  Substance Use Topics  . Smoking status: Current Every Day Smoker    Packs/day: 0.50    Years: 3.00    Types: Cigarettes  . Smokeless tobacco: Never Used  . Alcohol use No    Review of Systems Constitutional: Negative for fever. ENT: Negative for sore throat. Cardiovascular: Negative for chest pain. Respiratory: Negative for shortness of breath. Gastrointestinal: Negative for abdominal pain, vomiting and diarrhea. Genitourinary: Negative for dysuria. Musculoskeletal: Negative for back pain. Skin: Negative for rash. Neurological: Negative for headaches, focal weakness or numbness. Psychiatric: Positive for suicidal ideation, foreign body ingestions  10-point ROS otherwise negative.  ____________________________________________   PHYSICAL EXAM:  VITAL SIGNS: ED Triage Vitals [12/12/15 2025]  Enc Vitals Group     BP 139/89     Pulse Rate 75     Resp 18     Temp 99.8 F (37.7 C)     Temp Source Oral     SpO2 98 %     Weight 194 lb (88 kg)     Height 5\' 6"  (1.676 m)     Head Circumference      Peak Flow      Pain Score      Pain Loc      Pain Edu?      Excl. in [place]?  Constitutional: Alert and oriented. Well appearing and in no distress. Eyes: Conjunctivae are normal. PERRL. Normal extraocular movements. ENT   Head: Normocephalic and atraumatic.   Nose: No congestion/rhinnorhea.   Mouth/Throat: Mucous membranes are moist.   Neck: No stridor. Cardiovascular: Normal rate, regular rhythm. No murmurs, rubs, or gallops. Respiratory: Normal respiratory effort without tachypnea nor retractions. Breath sounds are clear and equal bilaterally. No  wheezes/rales/rhonchi. Gastrointestinal: Soft and nontender. Normal bowel sounds Musculoskeletal: Nontender with normal range of motion in all extremities. No lower extremity tenderness nor edema. Neurologic:  Normal speech and language. No gross focal neurologic deficits are appreciated.  Skin:  Skin is warm, dry and intact. No rash noted. Psychiatric: Mood and affect are normal. Speech and behavior are normal.  ____________________________________________  ED COURSE:  Pertinent labs & imaging results that were available during my care of the patient were reviewed by me and considered in my medical decision making (see chart for details). Clinical Course  Patient with a long history of manipulative behavior and foreign body ingestions. We will assess with basic imaging.  Procedures ____________________________________________   LABS (pertinent positives/negatives)  Labs Reviewed  COMPREHENSIVE METABOLIC PANEL - Abnormal; Notable for the following:       Result Value   ALT 7 (*)    All other components within normal limits  ACETAMINOPHEN LEVEL - Abnormal; Notable for the following:    Acetaminophen (Tylenol), Serum <10 (*)    All other components within normal limits  ETHANOL  SALICYLATE LEVEL  CBC  URINE DRUG SCREEN, QUALITATIVE ([facility] ONLY)  POCT PREGNANCY, URINE  POC URINE PREG, ED    RADIOLOGY Images were viewed by me  Chest x-ray, KUB Chest x-ray is unremarkable IMPRESSION: 1. Battery in the left upper quadrant, likely in the stomach. 2. Interval passage of other foreign bodies on the prior study. 3. Large amount of colonic stool. Electronically Signed ____________________________________________  FINAL ASSESSMENT AND PLAN  Foreign body ingestion  Plan: Patient with labs and imaging as dictated above. Patient is in no acute distress and is asymptomatic, will likely pass the battery without any difficulty. I discussed with GI on-call at [facility]. She is stable  for outpatient follow-up.   Earleen Newport, MD   Note: This dictation was prepared with Dragon dictation. Any transcriptional errors that result from this process are unintentional    Earleen Newport, MD 12/12/15 2211

## 2015-12-12 NOTE — ED Notes (Signed)
Spoke with [place] owner of [facility], ms. [place] will come and pick pt up.

## 2015-12-12 NOTE — ED Triage Notes (Signed)
Pt lives in a group home. Pt states another client at the group home got to visit w/ his family in [place] town. Pt's mother lives in [place] and she has not seen her in years. Pt states she got jealous of other client. :Pt states a friend of her's died two weeks ago. Pt states she swallowed a AA non-rechargeable battery at 1900 tonight w/ intention of killing herself. Pt denies HI. Pt states no one witnessed her swallow the battery, she disclosed her behavior to staff because she changed her mind about wanting to kill herself. Pt is open and cooperative during triage process.

## 2015-12-12 NOTE — ED Notes (Signed)
Spoke with mylene a staff member at [facility] regarding pt's discharge and transportation home. mylene states has been attempting to contact [place], owner of group home without success.

## 2015-12-13 ENCOUNTER — Emergency Department
Admission: EM | Admit: 2015-12-13 | Discharge: 2015-12-13 | Disposition: A | Discharge status: Home / Self Care | Payer: Medicaid Other | Attending: Emergency Medicine | Admitting: Emergency Medicine

## 2015-12-13 ENCOUNTER — Emergency Department: Payer: Medicaid Other

## 2015-12-13 ENCOUNTER — Other Ambulatory Visit: Payer: Self-pay

## 2015-12-13 ENCOUNTER — Encounter: Payer: Self-pay | Admitting: Emergency Medicine

## 2015-12-13 DIAGNOSIS — Y999 Unspecified external cause status: Secondary | ICD-10-CM | POA: Diagnosis not present

## 2015-12-13 DIAGNOSIS — I10 Essential (primary) hypertension: Secondary | ICD-10-CM | POA: Diagnosis not present

## 2015-12-13 DIAGNOSIS — F17210 Nicotine dependence, cigarettes, uncomplicated: Secondary | ICD-10-CM | POA: Insufficient documentation

## 2015-12-13 DIAGNOSIS — R300 Dysuria: Secondary | ICD-10-CM | POA: Diagnosis present

## 2015-12-13 DIAGNOSIS — Y939 Activity, unspecified: Secondary | ICD-10-CM | POA: Diagnosis not present

## 2015-12-13 DIAGNOSIS — F250 Schizoaffective disorder, bipolar type: Secondary | ICD-10-CM | POA: Diagnosis not present

## 2015-12-13 DIAGNOSIS — X58XXXD Exposure to other specified factors, subsequent encounter: Secondary | ICD-10-CM | POA: Insufficient documentation

## 2015-12-13 DIAGNOSIS — Z79899 Other long term (current) drug therapy: Secondary | ICD-10-CM | POA: Insufficient documentation

## 2015-12-13 DIAGNOSIS — F329 Major depressive disorder, single episode, unspecified: Secondary | ICD-10-CM | POA: Insufficient documentation

## 2015-12-13 DIAGNOSIS — E785 Hyperlipidemia, unspecified: Secondary | ICD-10-CM | POA: Insufficient documentation

## 2015-12-13 DIAGNOSIS — E039 Hypothyroidism, unspecified: Secondary | ICD-10-CM | POA: Insufficient documentation

## 2015-12-13 DIAGNOSIS — Y929 Unspecified place or not applicable: Secondary | ICD-10-CM | POA: Insufficient documentation

## 2015-12-13 DIAGNOSIS — T189XXD Foreign body of alimentary tract, part unspecified, subsequent encounter: Secondary | ICD-10-CM | POA: Insufficient documentation

## 2015-12-13 LAB — URINALYSIS COMPLETE WITH MICROSCOPIC (ARMC ONLY)
BILIRUBIN URINE: NEGATIVE
Bacteria, UA: NONE SEEN
GLUCOSE, UA: NEGATIVE mg/dL
Ketones, ur: NEGATIVE mg/dL
LEUKOCYTES UA: NEGATIVE
Nitrite: NEGATIVE
Protein, ur: NEGATIVE mg/dL
SPECIFIC GRAVITY, URINE: 1.009 (ref 1.005–1.030)
pH: 7 (ref 5.0–8.0)

## 2015-12-13 LAB — COMPREHENSIVE METABOLIC PANEL
ALBUMIN: 4.6 g/dL (ref 3.5–5.0)
ALT: 8 U/L — AB (ref 14–54)
AST: 17 U/L (ref 15–41)
Alkaline Phosphatase: 84 U/L (ref 38–126)
Anion gap: 11 (ref 5–15)
BUN: 13 mg/dL (ref 6–20)
CHLORIDE: 110 mmol/L (ref 101–111)
CO2: 20 mmol/L — AB (ref 22–32)
CREATININE: 0.78 mg/dL (ref 0.44–1.00)
Calcium: 10 mg/dL (ref 8.9–10.3)
GFR calc Af Amer: >60 mL/min (ref >60)
GFR calc non Af Amer: >60 mL/min (ref >60)
Glucose, Bld: 84 mg/dL (ref 65–99)
Potassium: 4 mmol/L (ref 3.5–5.1)
SODIUM: 141 mmol/L (ref 135–145)
Total Bilirubin: 0.7 mg/dL (ref 0.3–1.2)
Total Protein: 7.7 g/dL (ref 6.5–8.1)

## 2015-12-13 LAB — LIPASE, BLOOD: LIPASE: 23 U/L (ref 11–51)

## 2015-12-13 LAB — CBC
HEMATOCRIT: 40.8 % (ref 35.0–47.0)
Hemoglobin: 14.2 g/dL (ref 12.0–16.0)
MCH: 31.1 pg (ref 26.0–34.0)
MCHC: 34.8 g/dL (ref 32.0–36.0)
MCV: 89.5 fL (ref 80.0–100.0)
PLATELETS: 198 10*3/uL (ref 150–440)
RBC: 4.56 MIL/uL (ref 3.80–5.20)
RDW: 13.6 % (ref 11.5–14.5)
WBC: 8.4 10*3/uL (ref 3.6–11.0)

## 2015-12-13 IMAGING — CR DG ABDOMEN 1V
1 series · 2 of 2 positions shown · non-contrast
Comparison: [DATE]

CLINICAL DATA: Swallowed battery.

EXAM:
ABDOMEN - 1 VIEW

[Series 15: t abdomen supine · 0.14mm/px · 2 of 2 slices shown]
[im 1/2]
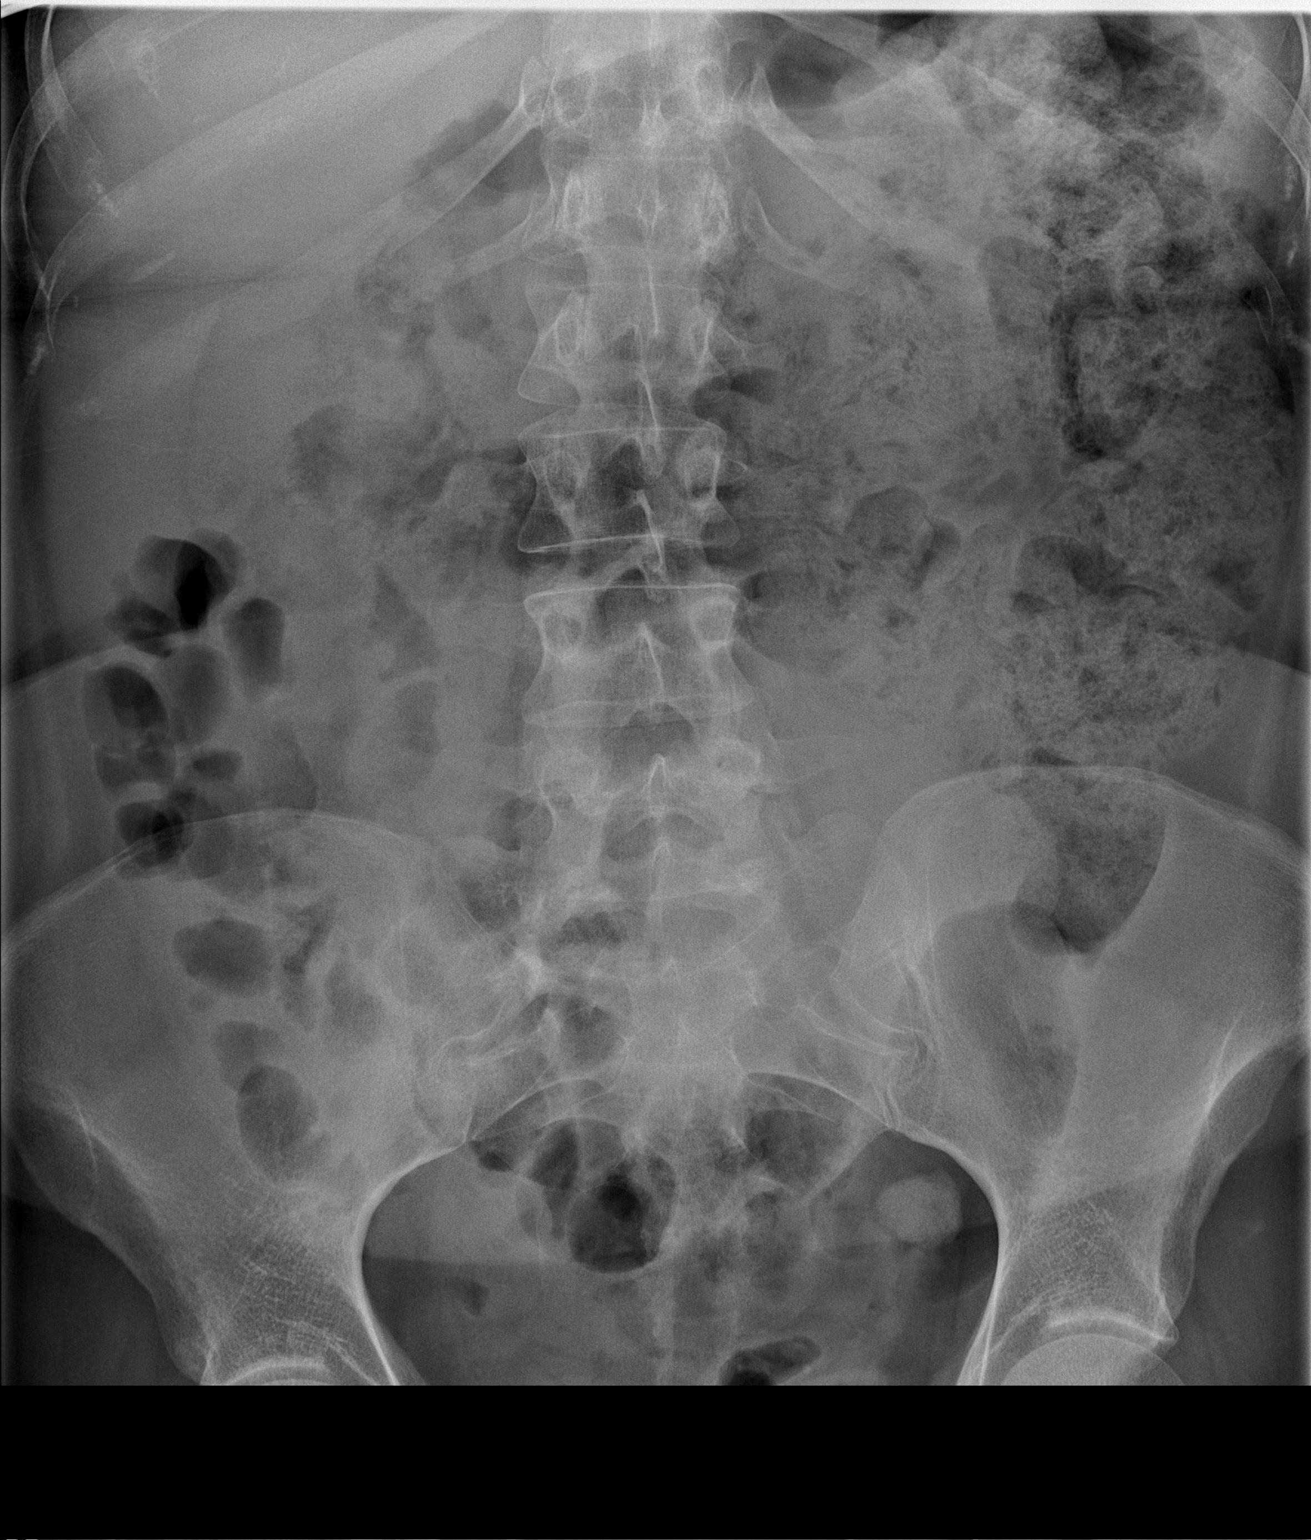
[im 2/2]
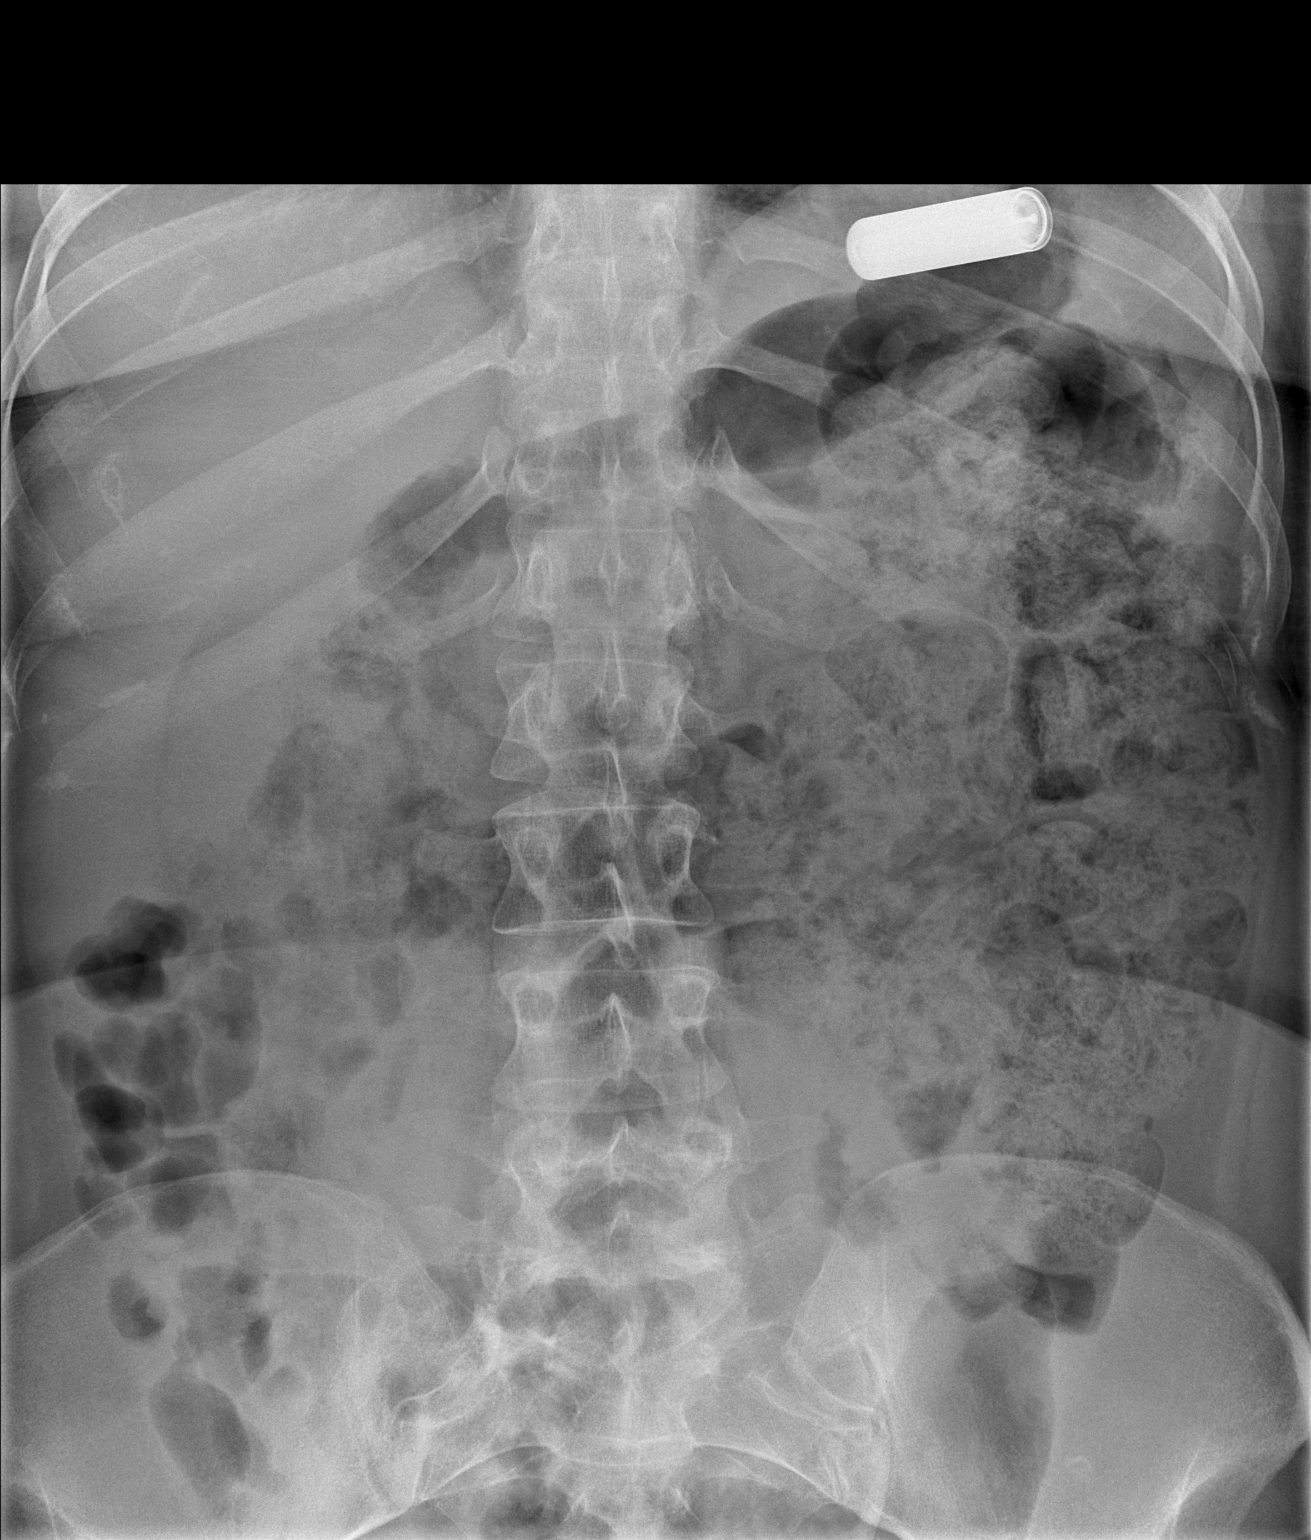

[2 of 2 positions shown; findings below may reference images not displayed]

FINDINGS: A battery is identified overlying the proximal stomach.

A moderate amount of stool in the transverse and descending colon
noted.

No dilated bowel loops identified.
IMPRESSION: Battery overlying the proximal stomach.

## 2015-12-13 MED ORDER — REGLAN 5 MG PO TABS: 5.0000 mg | ORAL_TABLET | Freq: Three times a day (TID) | ORAL | 0 refills | Status: DC

## 2015-12-13 NOTE — ED Notes (Signed)
Pt reports swallowing a battery yesterday. Denies SI/HI at this time states she was feeling that way yesterday

## 2015-12-13 NOTE — ED Triage Notes (Signed)
C/o chest and abdominal pain.  Onset last night.  Describes urinary urgency.

## 2015-12-13 NOTE — ED Notes (Signed)
Pt calls ride and will be pick up in 1 hour. Pt given food tray and will wait in lobby.

## 2015-12-13 NOTE — Discharge Instructions (Signed)
You need to have a repeat xray in one day to make sure the battery has started to move through your intestines!

## 2015-12-13 NOTE — ED Provider Notes (Signed)
[facility] Emergency Department Provider Note   ____________________________________________    I have reviewed the triage vital signs and the nursing notes.   HISTORY  Chief Complaint dysuria    HPI Melanie Bell is a 25 y.o. female who complains of dysuria and difficulty urinating. Notably the patient was seen last night after ingestion of a double a battery. This was found to be in the stomach. She has a long history of ingestions. The patient denies fevers or chills. She denies abdominal pain to me, her primary complaint is dysuria.   Past Medical History:  Diagnosis Date  . Anxiety   . Depression   . GERD (gastroesophageal reflux disease)   . Hallucinations 09/30/2014   Sizoaffective  . Hyperlipidemia   . Hypertension   . Tardive dyskinesia 10/2014   recent onset    Patient Active Problem List   Diagnosis Date Noted  . Foreign body ingestion 07/31/2015  . Schizoaffective disorder, bipolar type ([place]) 11/06/2014  . Tobacco use disorder 09/30/2014  . Hypothyroidism 09/29/2014  . Hypertension 09/29/2014  . Constipation 09/29/2014    Past Surgical History:  Procedure Laterality Date  . ABDOMINAL SURGERY     "years ago" to remove foreign objects  . APPENDECTOMY    . BREAST LUMPECTOMY Right   . COLONOSCOPY WITH PROPOFOL N/A 09/10/2015   Procedure: COLONOSCOPY WITH PROPOFOL;  Surgeon: [name], MD;  Location: [facility] ENDOSCOPY;  Service: Endoscopy;  Laterality: N/A;  . ESOPHAGOGASTRODUODENOSCOPY N/A 11/28/2014   Procedure: ESOPHAGOGASTRODUODENOSCOPY (EGD);  Surgeon: [name], MD;  Location: [facility] ENDOSCOPY;  Service: Endoscopy;  Laterality: N/A;  . LAPAROTOMY N/A 09/12/2015   Procedure: EXPLORATORY LAPAROTOMY;  Surgeon: Florene Glen, MD;  Location: [facility] ORS;  Service: General;  Laterality: N/A;  . SIGMOIDOSCOPY N/A 09/12/2015   Procedure: Lonell Face;  Surgeon: Florene Glen, MD;  Location: [facility] ORS;  Service:  General;  Laterality: N/A;  . WISDOM TOOTH EXTRACTION      Prior to Admission medications   Medication Sig Start Date End Date Taking? Authorizing Provider  amLODipine (NORVASC) 2.5 MG tablet Take 1 tablet (2.5 mg total) by mouth daily. 06/19/15   [name], MD  fluPHENAZine (PROLIXIN) 5 MG tablet Take 5 mg by mouth 2 (two) times daily. (REPLACES previous dose of 5mg  in morning, 5mg  at midday, 10mg  at night)    Historical Provider, MD  levothyroxine (SYNTHROID, LEVOTHROID) 75 MCG tablet Take 1 tablet (75 mcg total) by mouth daily before breakfast. 06/19/15   [name] [name], MD  OXcarbazepine (TRILEPTAL) 150 MG tablet Take 1 tablet (150 mg total) by mouth 2 (two) times daily. Patient taking differently: Take 150 mg by mouth 2 (two) times daily. (according to [facility] pt has only been receiving this once daily) 06/19/15   [name] [name], MD  REGLAN 5 MG tablet Take 1 tablet (5 mg total) by mouth 3 (three) times daily. 12/13/15 12/14/15  Lavonia Drafts, MD  venlafaxine XR (EFFEXOR-XR) 150 MG 24 hr capsule Take 1 capsule (150 mg total) by mouth daily with breakfast. 06/19/15   [name], MD  .   Allergies Betadine [povidone iodine]; Shellfish-derived products; and Iodine  Family History  Problem Relation Age of Onset  . Depression Mother   . Hypertension Mother     Social History Social History  Substance Use Topics  . Smoking status: Current Every Day Smoker    Packs/day: 0.50    Years: 3.00    Types:  Cigarettes  . Smokeless tobacco: Never Used  . Alcohol use No    Review of Systems  Constitutional: No fever/chills  ENT: No sore throat. Cardiovascular: Denies chest painTo me Respiratory: Denies shortness of breath. Gastrointestinal: No abdominal pain.  No nausea, no vomiting.   Genitourinary: As above  Skin: Negative for rash.   10-point ROS otherwise negative.  ____________________________________________   PHYSICAL EXAM:  VITAL SIGNS: ED Triage  Vitals  Enc Vitals Group     BP 12/13/15 1125 117/72     Pulse Rate 12/13/15 1125 90     Resp 12/13/15 1232 16     Temp 12/13/15 1125 98.4 F (36.9 C)     Temp Source 12/13/15 1125 Oral     SpO2 12/13/15 1125 96 %     Weight 12/13/15 1125 194 lb (88 kg)     Height 12/13/15 1125 5\' 6"  (1.676 m)     Head Circumference --      Peak Flow --      Pain Score 12/13/15 1127 8     Pain Loc --      Pain Edu? --      Excl. in [place]? --     Constitutional: Alert and oriented. No acute distress.  Eyes: Conjunctivae are normal.  Head: Atraumatic. Nose: No congestion/rhinnorhea. Mouth/Throat: Mucous membranes are moist.   Neck:  Painless ROM Cardiovascular: Normal rate, regular rhythm. Grossly normal heart sounds.  Good peripheral circulation. Respiratory: Normal respiratory effort.  No retractions. Lungs CTAB. Gastrointestinal: Soft and nontender. No distention.  No CVA tenderness. Patient has no abdominal tenderness to palpation, her exam is benign Genitourinary: deferred Musculoskeletal: No lower extremity tenderness nor edema.  Warm and well perfused Neurologic:  Normal speech and language. No gross focal neurologic deficits are appreciated.  Skin:  Skin is warm, dry and intact. No rash noted. Psychiatric: Mood and affect are flat  ____________________________________________   LABS (all labs ordered are listed, but only abnormal results are displayed)  Labs Reviewed  COMPREHENSIVE METABOLIC PANEL - Abnormal; Notable for the following:       Result Value   CO2 20 (*)    ALT 8 (*)    All other components within normal limits  URINALYSIS COMPLETEWITH MICROSCOPIC ([facility] ONLY) - Abnormal; Notable for the following:    Color, Urine YELLOW (*)    APPearance CLEAR (*)    Hgb urine dipstick 2+ (*)    Squamous Epithelial / LPF 0-5 (*)    All other components within normal limits  LIPASE, BLOOD  CBC    ____________________________________________  EKG  None ____________________________________________  RADIOLOGY  Battery remains in the stomach ____________________________________________   PROCEDURES  Procedure(s) performed: No    Critical Care performed: No ____________________________________________   INITIAL IMPRESSION / ASSESSMENT AND PLAN / ED COURSE  Pertinent labs & imaging results that were available during my care of the patient were reviewed by me and considered in my medical decision making (see chart for details).  I contacted the national battery hotline in [place]. They recommend a trial of Reglan and repeat x-ray in 24 hours to see if the battery has passed the pylorus given that the patient has a benign exam. This was communicated to the patient and her group home. Also of note the police received a missing person's notification on the patient today. They are aware that she is here.    Clinical Course   ____________________________________________   FINAL CLINICAL IMPRESSION(S) / ED DIAGNOSES  Final  diagnoses:  Foreign body ingestion, subsequent encounter  Dysuria      NEW MEDICATIONS STARTED DURING THIS VISIT:  New Prescriptions   REGLAN 5 MG TABLET    Take 1 tablet (5 mg total) by mouth 3 (three) times daily.     Note:  This document was prepared using Dragon voice recognition software and may include unintentional dictation errors.    Lavonia Drafts, MD 12/13/15 [phone]

## 2015-12-13 NOTE — ED Notes (Signed)
Pt reports she swallowed battery yesterday. Was seen in Ed yesterday. Pt reports swallowed battery because she was suicidal. Denies thoughts of wanting to hurt herself or others currently. Denies swallowing anything today.

## 2016-02-11 ENCOUNTER — Emergency Department: Payer: Medicaid Other

## 2016-02-11 ENCOUNTER — Emergency Department
Admission: EM | Admit: 2016-02-11 | Discharge: 2016-02-11 | Disposition: A | Discharge status: Home / Self Care | Payer: Medicaid Other | Attending: Emergency Medicine | Admitting: Emergency Medicine

## 2016-02-11 ENCOUNTER — Encounter: Payer: Self-pay | Admitting: Emergency Medicine

## 2016-02-11 DIAGNOSIS — W458XXA Other foreign body or object entering through skin, initial encounter: Secondary | ICD-10-CM | POA: Insufficient documentation

## 2016-02-11 DIAGNOSIS — Z79899 Other long term (current) drug therapy: Secondary | ICD-10-CM | POA: Insufficient documentation

## 2016-02-11 DIAGNOSIS — S9032XA Contusion of left foot, initial encounter: Secondary | ICD-10-CM

## 2016-02-11 DIAGNOSIS — F17210 Nicotine dependence, cigarettes, uncomplicated: Secondary | ICD-10-CM | POA: Insufficient documentation

## 2016-02-11 DIAGNOSIS — I10 Essential (primary) hypertension: Secondary | ICD-10-CM | POA: Diagnosis not present

## 2016-02-11 DIAGNOSIS — Y990 Civilian activity done for income or pay: Secondary | ICD-10-CM | POA: Diagnosis not present

## 2016-02-11 DIAGNOSIS — Y9289 Other specified places as the place of occurrence of the external cause: Secondary | ICD-10-CM | POA: Insufficient documentation

## 2016-02-11 DIAGNOSIS — S91322A Laceration with foreign body, left foot, initial encounter: Secondary | ICD-10-CM | POA: Diagnosis not present

## 2016-02-11 DIAGNOSIS — Y9301 Activity, walking, marching and hiking: Secondary | ICD-10-CM | POA: Insufficient documentation

## 2016-02-11 DIAGNOSIS — E039 Hypothyroidism, unspecified: Secondary | ICD-10-CM | POA: Diagnosis not present

## 2016-02-11 DIAGNOSIS — S99922A Unspecified injury of left foot, initial encounter: Secondary | ICD-10-CM | POA: Diagnosis present

## 2016-02-11 IMAGING — DX DG FOOT COMPLETE 3+V*L*
3 series · 3 of 3 positions shown · non-contrast
Comparison: None.

CLINICAL DATA: Ran into furniture last night. Suspected foreign
body.

EXAM:
LEFT FOOT - COMPLETE 3+ VIEW

[foot ap]
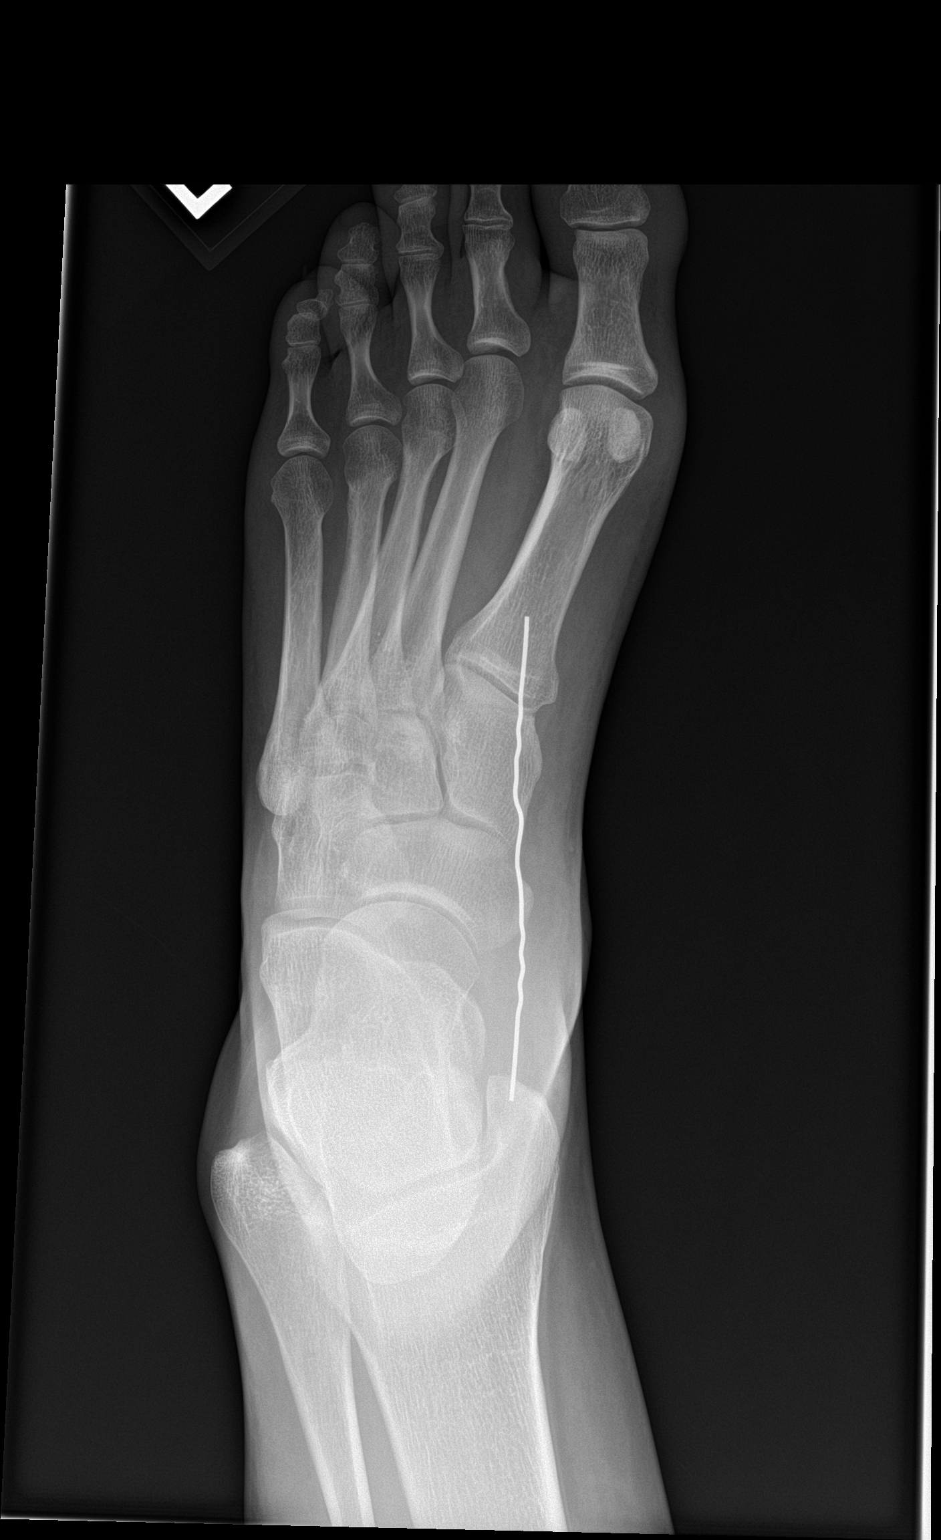

[foot obl]
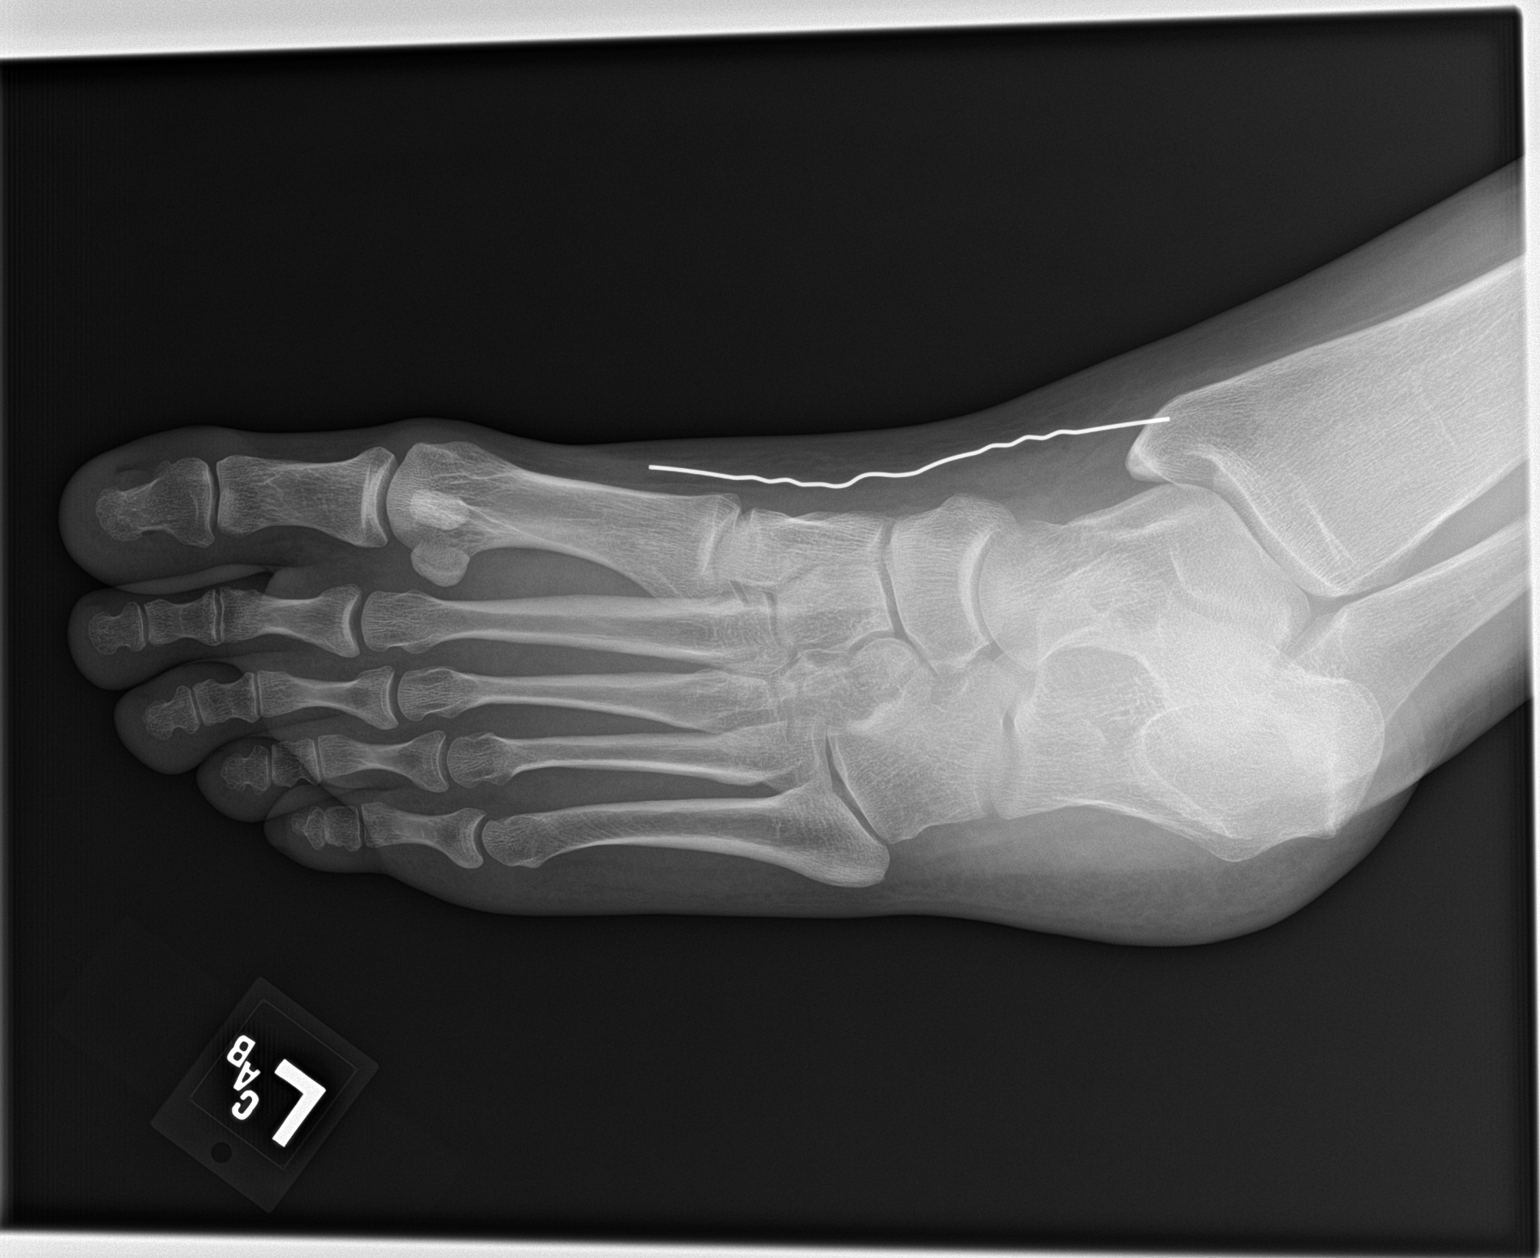

[foot lat]
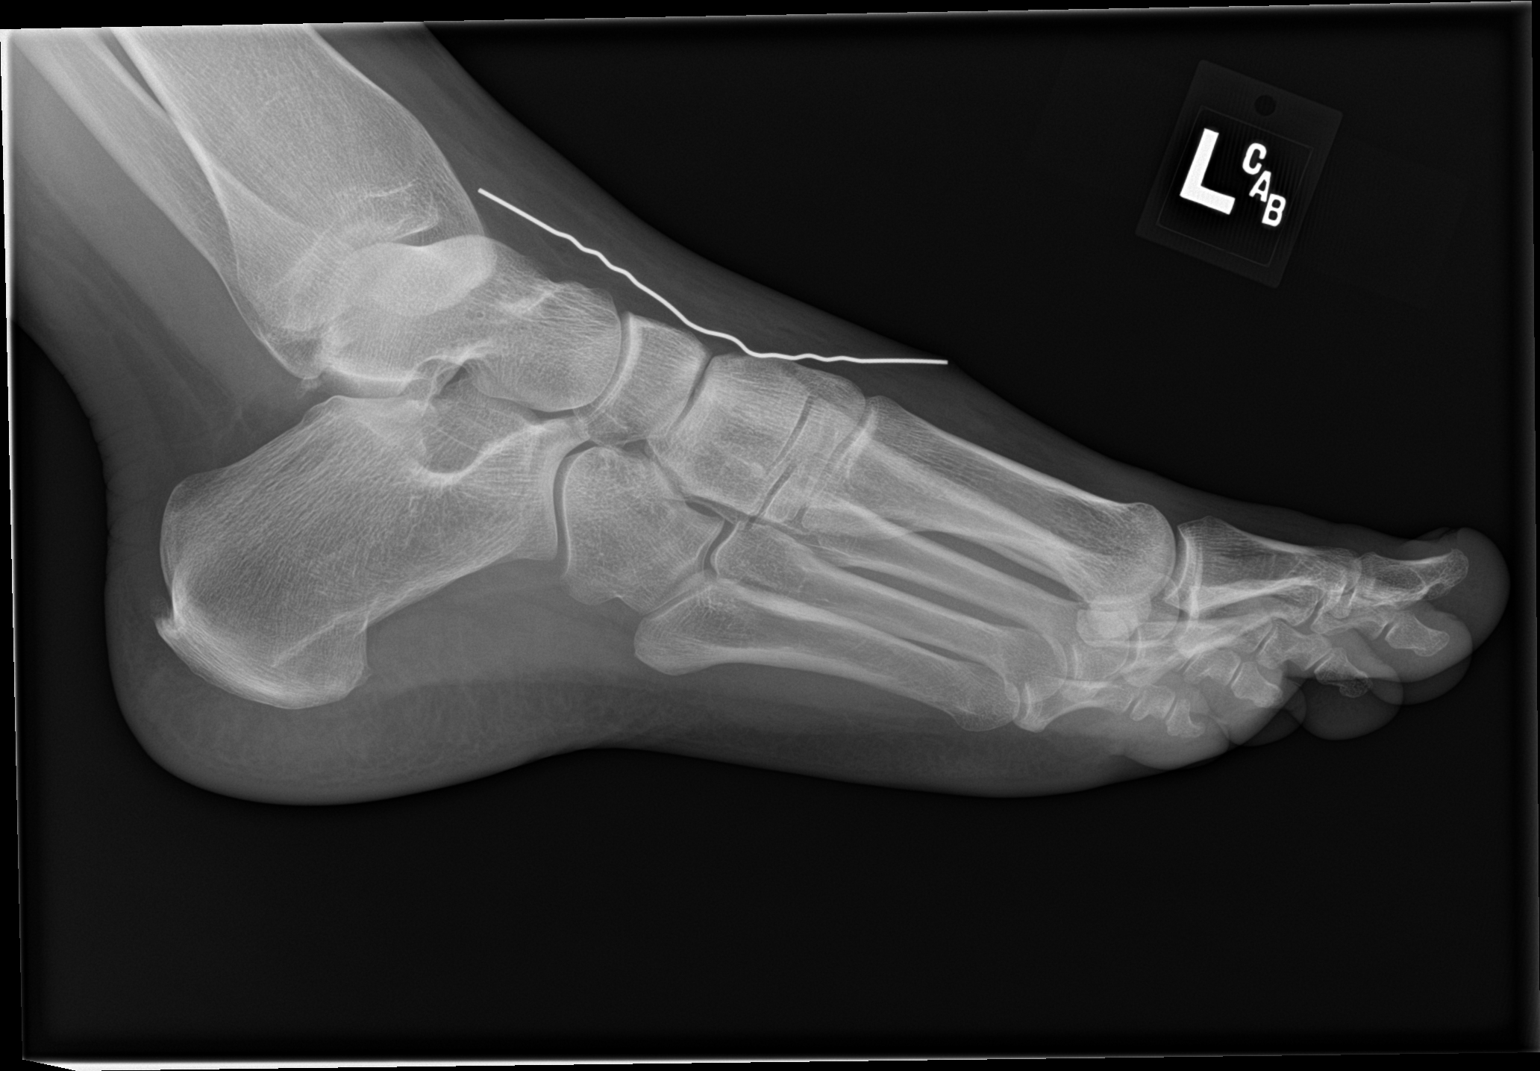

[3 of 3 positions shown; findings below may reference images not displayed]

FINDINGS: Normal alignment.  No fracture.  No significant arthropathy

Metal wire within the soft tissues dorsally extending from the ankle
joint to the first metatarsal. The wire measures approximately 9 cm
in length.
IMPRESSION: Large metal wire in the dorsal soft tissues of the foot.

## 2016-02-11 NOTE — ED Triage Notes (Signed)
Pt states she walked into dresser last night, c/o pain in left foot, bruising noted.

## 2016-02-11 NOTE — ED Notes (Signed)
Patient's exam room was empty and patient was not found in lobby. When group home called, group home stated that person from group home had picked patient up from lobby. Patient did not notify staff she was leaving. Discharge papers therefore not given before leaving.

## 2016-02-11 NOTE — ED Provider Notes (Signed)
[facility] Emergency Department Provider Note  ____________________________________________   First MD Initiated Contact with Patient 02/11/16 1029     (approximate)  I have reviewed the triage vital signs and the nursing notes.   HISTORY  Chief Complaint Foot Pain   HPI Melanie Bell is a 25 y.o. female is brought in today by a group home with complaint of left foot pain. Patient states that she walked into a dresser last evening and has pain in her left foot. She states that she has not been given any over-the-counter medication for her pain as she has to have a doctor's order to get medication. She denies any numbness into her digits. She has continued to walk despite her injury.Currently she rates her pain as a 9/10.   Past Medical History:  Diagnosis Date  . Anxiety   . Depression   . GERD (gastroesophageal reflux disease)   . Hallucinations 09/30/2014   Sizoaffective  . Hyperlipidemia   . Hypertension   . Tardive dyskinesia 10/2014   recent onset    Patient Active Problem List   Diagnosis Date Noted  . Foreign body ingestion 07/31/2015  . Schizoaffective disorder, bipolar type ([place]) 11/06/2014  . Tobacco use disorder 09/30/2014  . Hypothyroidism 09/29/2014  . Hypertension 09/29/2014  . Constipation 09/29/2014    Past Surgical History:  Procedure Laterality Date  . ABDOMINAL SURGERY     "years ago" to remove foreign objects  . APPENDECTOMY    . BREAST LUMPECTOMY Right   . COLONOSCOPY WITH PROPOFOL N/A 09/10/2015   Procedure: COLONOSCOPY WITH PROPOFOL;  Surgeon: [name], MD;  [facility]COPY;  Service: Endoscopy;  Laterality: N/A;  . ESOPHAGOGASTRODUODENOSCOPY N/A 11/28/2014   Procedure: ESOPHAGOGASTRODUODENOSCOPY (EGD);  Surgeon: [name], MD;  Location: [facility] ENDOSCOPY;  Service: Endoscopy;  Laterality: N/A;  . LAPAROTOMY N/A 09/12/2015   Procedure: EXPLORATORY LAPAROTOMY;  Surgeon: Florene Glen, MD;   Location: [facility] ORS;  Service: General;  Laterality: N/A;  . SIGMOIDOSCOPY N/A 09/12/2015   Procedure: Lonell Face;  Surgeon: Florene Glen, MD;  Location: [facility] ORS;  Service: General;  Laterality: N/A;  . WISDOM TOOTH EXTRACTION      Prior to Admission medications   Medication Sig Start Date End Date Taking? Authorizing Provider  amLODipine (NORVASC) 2.5 MG tablet Take 1 tablet (2.5 mg total) by mouth daily. 06/19/15   [name], MD  fluPHENAZine (PROLIXIN) 5 MG tablet Take 5 mg by mouth 2 (two) times daily. (REPLACES previous dose of 5mg  in morning, 5mg  at midday, 10mg  at night)    Historical Provider, MD  levothyroxine (SYNTHROID, LEVOTHROID) 75 MCG tablet Take 1 tablet (75 mcg total) by mouth daily before breakfast. 06/19/15   [name] [name], MD  OXcarbazepine (TRILEPTAL) 150 MG tablet Take 1 tablet (150 mg total) by mouth 2 (two) times daily. Patient taking differently: Take 150 mg by mouth 2 (two) times daily. (according to [facility] pt has only been receiving this once daily) 06/19/15   [name] [name], MD  REGLAN 5 MG tablet Take 1 tablet (5 mg total) by mouth 3 (three) times daily. 12/13/15 12/14/15  Lavonia Drafts, MD  venlafaxine XR (EFFEXOR-XR) 150 MG 24 hr capsule Take 1 capsule (150 mg total) by mouth daily with breakfast. 06/19/15   [name], MD    Allergies Betadine [povidone iodine]; Shellfish-derived products; and Iodine  Family History  Problem Relation Age of Onset  . Depression Mother   . Hypertension  Mother     Social History Social History  Substance Use Topics  . Smoking status: Current Every Day Smoker    Packs/day: 0.50    Years: 3.00    Types: Cigarettes  . Smokeless tobacco: Never Used  . Alcohol use No    Review of Systems Constitutional: No fever/chills Cardiovascular: Denies chest pain. Respiratory: Denies shortness of breath. Gastrointestinal:   No nausea, no vomiting.  Musculoskeletal: Negative for back pain. Positive Left  foot pain. Skin: Negative for rash. Neurological: Negative for headaches, focal weakness or numbness.  10-point ROS otherwise negative.  ____________________________________________   PHYSICAL EXAM:  VITAL SIGNS: ED Triage Vitals  Enc Vitals Group     BP 02/11/16 1003 99/71     Pulse Rate 02/11/16 1003 90     Resp 02/11/16 1003 18     Temp 02/11/16 1003 98.1 F (36.7 C)     Temp Source 02/11/16 1003 Oral     SpO2 02/11/16 1003 98 %     Weight 02/11/16 1004 199 lb (90.3 kg)     Height 02/11/16 1004 5\' 6"  (1.676 m)     Head Circumference --      Peak Flow --      Pain Score 02/11/16 1004 9     Pain Loc --      Pain Edu? --      Excl. in [place]? --     Constitutional: Alert and oriented. Well appearing and in no acute distress. Eyes: Conjunctivae are normal. PERRL. EOMI. Head: Atraumatic. Nose: No congestion/rhinnorhea. Mouth/Throat: Mucous membranes are moist.  Oropharynx non-erythematous. Neck: No stridor.   Cardiovascular: Normal rate, regular rhythm. Grossly normal heart sounds.  Good peripheral circulation. Respiratory: Normal respiratory effort.  No retractions. Lungs CTAB. Musculoskeletal: Moves upper and lower extremities without any difficulty. On examination of the left foot there is no gross deformity. There is some ecchymosis in various stages of healing. There is no skin abrasions or drainage. Digits distally motor sensory function intact and capillary refill less than 3 seconds. There is some minimal tenderness on palpation of the dorsum. Neurologic:  Normal speech and language. No gross focal neurologic deficits are appreciated. No gait instability. Skin:  Skin is warm, dry and intact. No rash noted. On the dorsal aspect of the left foot there is a resolving ecchymotic area but no recent bruising is noted. Skin is intact. There is no drainage from any area. Psychiatric: Mood and affect are normal. Speech and behavior are  normal.  ____________________________________________   LABS (all labs ordered are listed, but only abnormal results are displayed)  Labs Reviewed - No data to display  RADIOLOGY  Left foot x-ray per radiologist shows a foreign body present. IMPRESSION:  Large metal wire in the dorsal soft tissues of the foot   I, Johnn Hai, personally viewed and evaluated these images (plain radiographs) as part of my medical decision making, as well as reviewing the written report by the radiologist. ____________________________________________   PROCEDURES  Procedure(s) performed: None  Procedures  Critical Care performed: No  ____________________________________________   INITIAL IMPRESSION / ASSESSMENT AND PLAN / ED COURSE  Pertinent labs & imaging results that were available during my care of the patient were reviewed by me and considered in my medical decision making (see chart for details).    Clinical Course  Patient's laboratory while in the emergency room. When confronted with her x-ray results patient states that the paperclip has been there 7 -8  months  and that she placed it there. Patient is follow-up with podiatry at [facility] Group time was informed that she does have foreign body in her left foot that'll need to be removed by one of the specialist. Patient was not given any further medication in addition to what she is already taking.  ___________________________________________   FINAL CLINICAL IMPRESSION(S) / ED DIAGNOSES  Final diagnoses:  Laceration of left foot with foreign body, initial encounter  Contusion of left foot, initial encounter      NEW MEDICATIONS STARTED DURING THIS VISIT:  New Prescriptions   No medications on file     Note:  This document was prepared using Dragon voice recognition software and may include unintentional dictation errors.    Johnn Hai, PA-C 02/11/16 Pierpoint Summers, PA-C 02/11/16  Mower Quigley, MD 02/11/16 [phone]

## 2016-02-11 NOTE — Discharge Instructions (Signed)
You will need to make an appointment with the podiatry department to have your foreign body removed.

## 2016-02-11 NOTE — ED Notes (Signed)
Pt caregiver from group home notified that pt is ready to be discharged and will need transportation back to group home. This RN spoke with Allene Pyo [phone] and is aware that pt is ready to picked up. Caregiver states will be app 2 hrs before they can pick her up.

## 2016-02-21 ENCOUNTER — Other Ambulatory Visit: Payer: Self-pay

## 2016-02-21 ENCOUNTER — Encounter (HOSPITAL_COMMUNITY)
Admission: EM | Disposition: A | Discharge status: Home / Self Care | Payer: Self-pay | Source: Home / Self Care | Attending: Emergency Medicine

## 2016-02-21 ENCOUNTER — Emergency Department (HOSPITAL_COMMUNITY): Payer: Medicaid Other

## 2016-02-21 ENCOUNTER — Ambulatory Visit (HOSPITAL_COMMUNITY)
Admission: EM | Admit: 2016-02-21 | Discharge: 2016-02-22 | Disposition: A | Discharge status: Home / Self Care | Payer: Medicaid Other | Attending: Emergency Medicine | Admitting: Emergency Medicine

## 2016-02-21 ENCOUNTER — Emergency Department
Admission: EM | Admit: 2016-02-21 | Discharge: 2016-02-21 | Disposition: A | Discharge status: Other Acute Inpatient Hospital | Payer: Medicaid Other | Attending: Emergency Medicine | Admitting: Emergency Medicine

## 2016-02-21 ENCOUNTER — Encounter (HOSPITAL_COMMUNITY): Payer: Self-pay | Admitting: Emergency Medicine

## 2016-02-21 ENCOUNTER — Emergency Department: Payer: Medicaid Other

## 2016-02-21 DIAGNOSIS — F17210 Nicotine dependence, cigarettes, uncomplicated: Secondary | ICD-10-CM | POA: Diagnosis not present

## 2016-02-21 DIAGNOSIS — X58XXXA Exposure to other specified factors, initial encounter: Secondary | ICD-10-CM | POA: Diagnosis not present

## 2016-02-21 DIAGNOSIS — Z79899 Other long term (current) drug therapy: Secondary | ICD-10-CM | POA: Insufficient documentation

## 2016-02-21 DIAGNOSIS — T182XXA Foreign body in stomach, initial encounter: Secondary | ICD-10-CM | POA: Diagnosis not present

## 2016-02-21 DIAGNOSIS — E039 Hypothyroidism, unspecified: Secondary | ICD-10-CM | POA: Diagnosis not present

## 2016-02-21 DIAGNOSIS — F329 Major depressive disorder, single episode, unspecified: Secondary | ICD-10-CM | POA: Diagnosis not present

## 2016-02-21 DIAGNOSIS — K3189 Other diseases of stomach and duodenum: Secondary | ICD-10-CM | POA: Diagnosis not present

## 2016-02-21 DIAGNOSIS — I10 Essential (primary) hypertension: Secondary | ICD-10-CM | POA: Diagnosis not present

## 2016-02-21 DIAGNOSIS — Z8249 Family history of ischemic heart disease and other diseases of the circulatory system: Secondary | ICD-10-CM | POA: Diagnosis not present

## 2016-02-21 DIAGNOSIS — T189XXA Foreign body of alimentary tract, part unspecified, initial encounter: Secondary | ICD-10-CM | POA: Diagnosis not present

## 2016-02-21 DIAGNOSIS — K922 Gastrointestinal hemorrhage, unspecified: Secondary | ICD-10-CM | POA: Diagnosis not present

## 2016-02-21 DIAGNOSIS — X838XXA Intentional self-harm by other specified means, initial encounter: Secondary | ICD-10-CM | POA: Insufficient documentation

## 2016-02-21 DIAGNOSIS — K2211 Ulcer of esophagus with bleeding: Secondary | ICD-10-CM | POA: Diagnosis not present

## 2016-02-21 DIAGNOSIS — K254 Chronic or unspecified gastric ulcer with hemorrhage: Secondary | ICD-10-CM | POA: Diagnosis not present

## 2016-02-21 DIAGNOSIS — Z818 Family history of other mental and behavioral disorders: Secondary | ICD-10-CM | POA: Diagnosis not present

## 2016-02-21 DIAGNOSIS — Y92009 Unspecified place in unspecified non-institutional (private) residence as the place of occurrence of the external cause: Secondary | ICD-10-CM | POA: Diagnosis not present

## 2016-02-21 DIAGNOSIS — Y939 Activity, unspecified: Secondary | ICD-10-CM | POA: Diagnosis not present

## 2016-02-21 DIAGNOSIS — J45909 Unspecified asthma, uncomplicated: Secondary | ICD-10-CM | POA: Insufficient documentation

## 2016-02-21 DIAGNOSIS — T1491XA Suicide attempt, initial encounter: Secondary | ICD-10-CM

## 2016-02-21 DIAGNOSIS — Y999 Unspecified external cause status: Secondary | ICD-10-CM | POA: Diagnosis not present

## 2016-02-21 HISTORY — PX: ESOPHAGOGASTRODUODENOSCOPY: SHX5428

## 2016-02-21 HISTORY — DX: Unspecified asthma, uncomplicated: J45.909

## 2016-02-21 LAB — CBC WITH DIFFERENTIAL/PLATELET
BASOS ABS: 0.1 10*3/uL (ref 0–0.1)
BASOS PCT: 1 %
Eosinophils Absolute: 0.4 10*3/uL (ref 0–0.7)
Eosinophils Relative: 6 %
HCT: 35.7 % (ref 35.0–47.0)
Hemoglobin: 12.4 g/dL (ref 12.0–16.0)
LYMPHS PCT: 28 %
Lymphs Abs: 1.7 10*3/uL (ref 1.0–3.6)
MCH: 30.8 pg (ref 26.0–34.0)
MCHC: 34.9 g/dL (ref 32.0–36.0)
MCV: 88.3 fL (ref 80.0–100.0)
MONO ABS: 0.5 10*3/uL (ref 0.2–0.9)
Monocytes Relative: 8 %
NEUTROS ABS: 3.4 10*3/uL (ref 1.4–6.5)
NEUTROS PCT: 57 %
PLATELETS: 241 10*3/uL (ref 150–440)
RBC: 4.04 MIL/uL (ref 3.80–5.20)
RDW: 13 % (ref 11.5–14.5)
WBC: 6 10*3/uL (ref 3.6–11.0)

## 2016-02-21 LAB — COMPREHENSIVE METABOLIC PANEL
ALBUMIN: 3.8 g/dL (ref 3.5–5.0)
ALK PHOS: 72 U/L (ref 38–126)
ALT: 7 U/L — ABNORMAL LOW (ref 14–54)
ANION GAP: 5 (ref 5–15)
AST: 18 U/L (ref 15–41)
BILIRUBIN TOTAL: 0.5 mg/dL (ref 0.3–1.2)
BUN: 8 mg/dL (ref 6–20)
CALCIUM: 9.4 mg/dL (ref 8.9–10.3)
CO2: 26 mmol/L (ref 22–32)
Chloride: 106 mmol/L (ref 101–111)
Creatinine, Ser: 0.76 mg/dL (ref 0.44–1.00)
GFR calc non Af Amer: >60 mL/min (ref >60)
GLUCOSE: 92 mg/dL (ref 65–99)
POTASSIUM: 4.2 mmol/L (ref 3.5–5.1)
SODIUM: 137 mmol/L (ref 135–145)
TOTAL PROTEIN: 7.4 g/dL (ref 6.5–8.1)

## 2016-02-21 LAB — LIPASE, BLOOD: LIPASE: 22 U/L (ref 11–51)

## 2016-02-21 LAB — POCT PREGNANCY, URINE: Preg Test, Ur: NEGATIVE

## 2016-02-21 LAB — SALICYLATE LEVEL

## 2016-02-21 LAB — ACETAMINOPHEN LEVEL

## 2016-02-21 IMAGING — DX DG ABDOMEN 1V
2 series · 2 of 2 positions shown · non-contrast
Comparison: [DATE]

CLINICAL DATA: Ingested foreign objects.

EXAM:
ABDOMEN - 1 VIEW

[abdomen kub (1 of 2)]
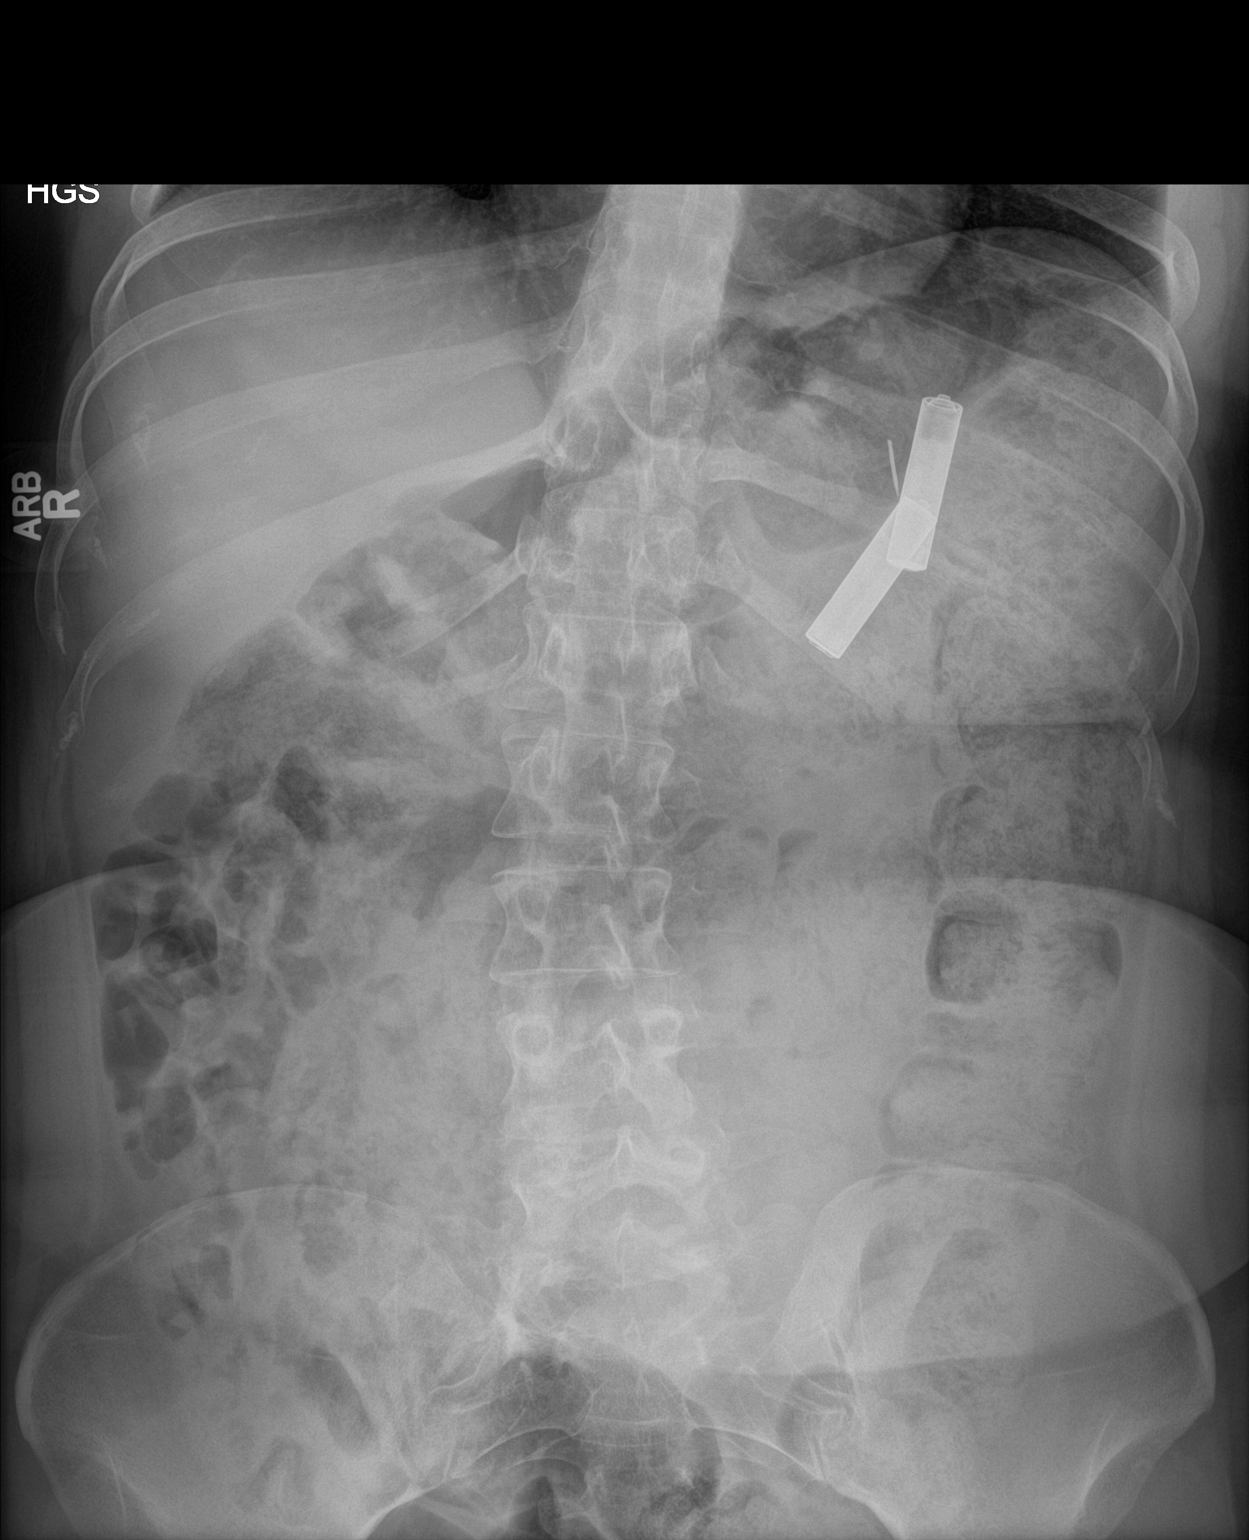

[abdomen kub (2 of 2)]
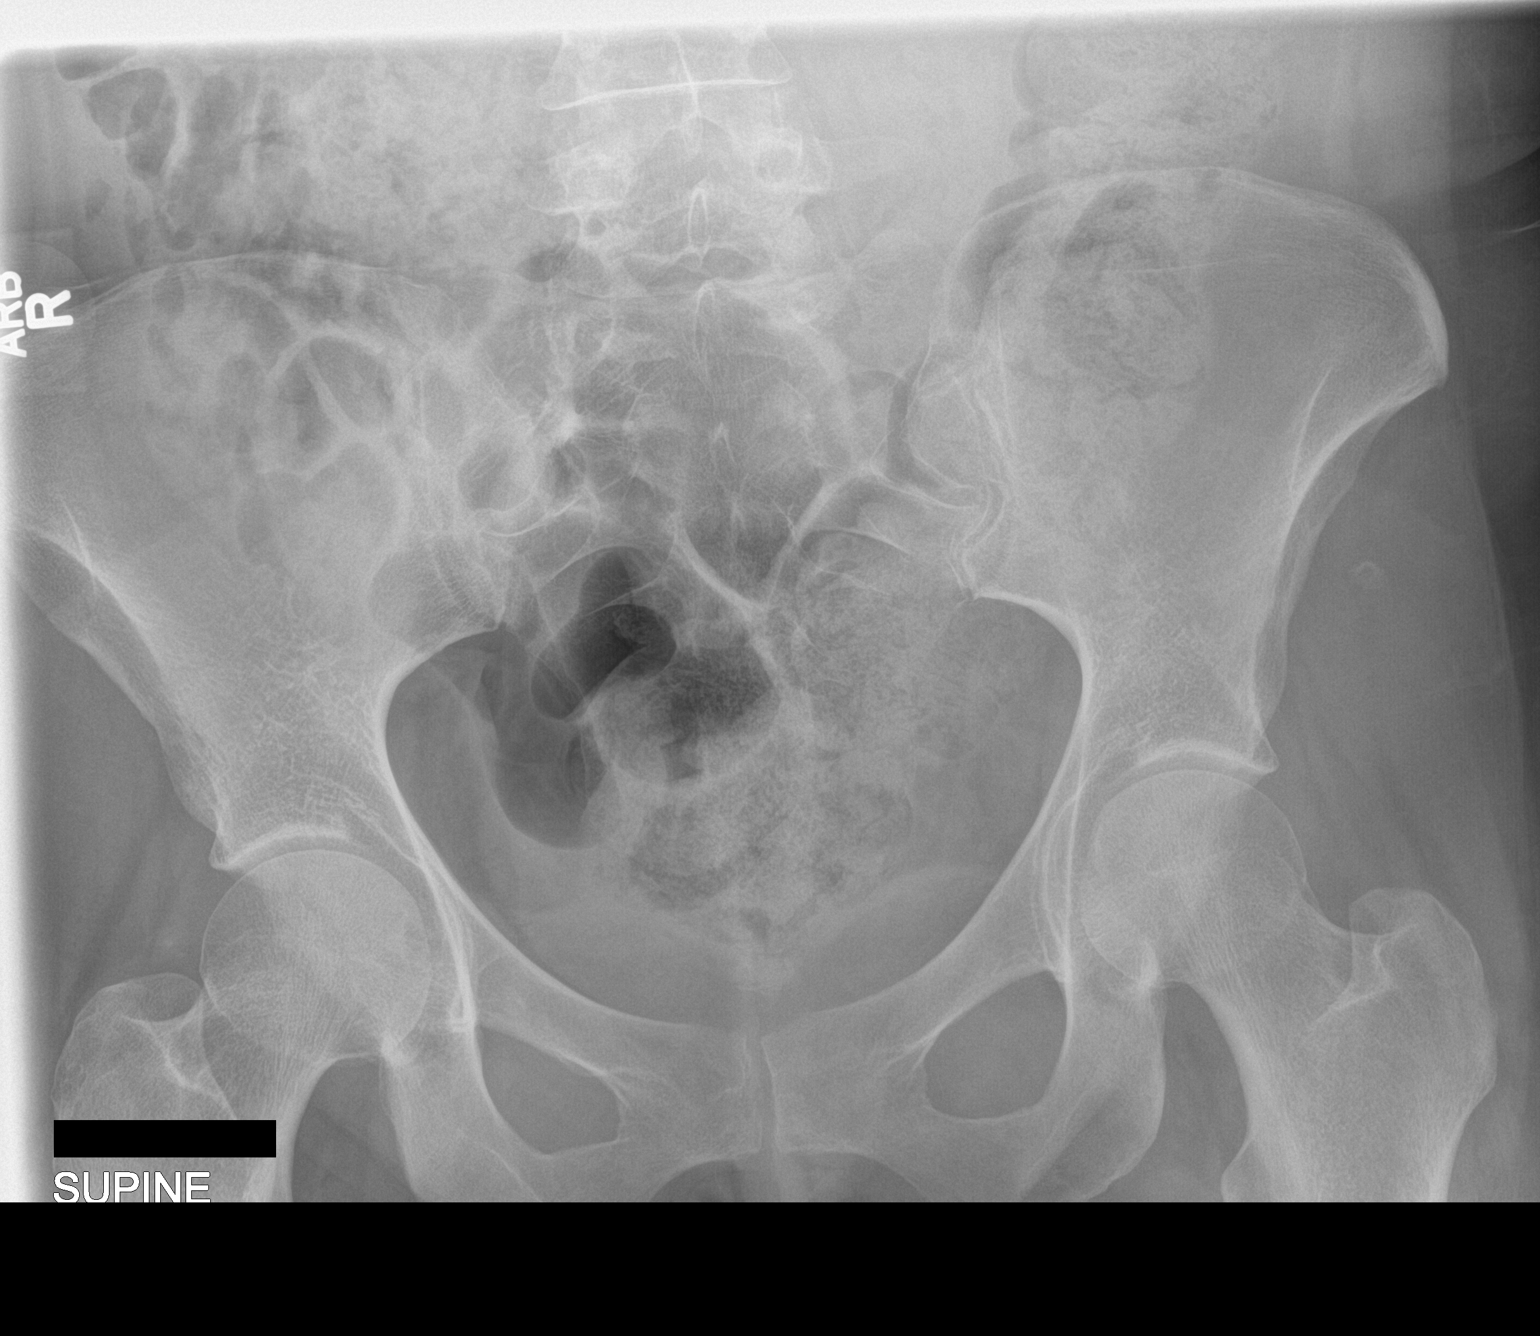

[2 of 2 positions shown; findings below may reference images not displayed]

FINDINGS: Foreign objects are present in the left upper quadrant of the
abdomen, presumably in the stomach. Two of these appear to be small
batteries and the third looks like a small nail or tack. Large
amount of fecal matter present throughout the colon.
IMPRESSION: Two batteries and a small nail or tack in the left upper quadrant,
probably within the gastric fundus.

## 2016-02-21 IMAGING — DX DG CHEST 1V
1 series · 1 of 1 positions shown · non-contrast
Comparison: [DATE]

CLINICAL DATA: Ingested foreign objects.

EXAM:
CHEST 1 VIEW

[chest ap]
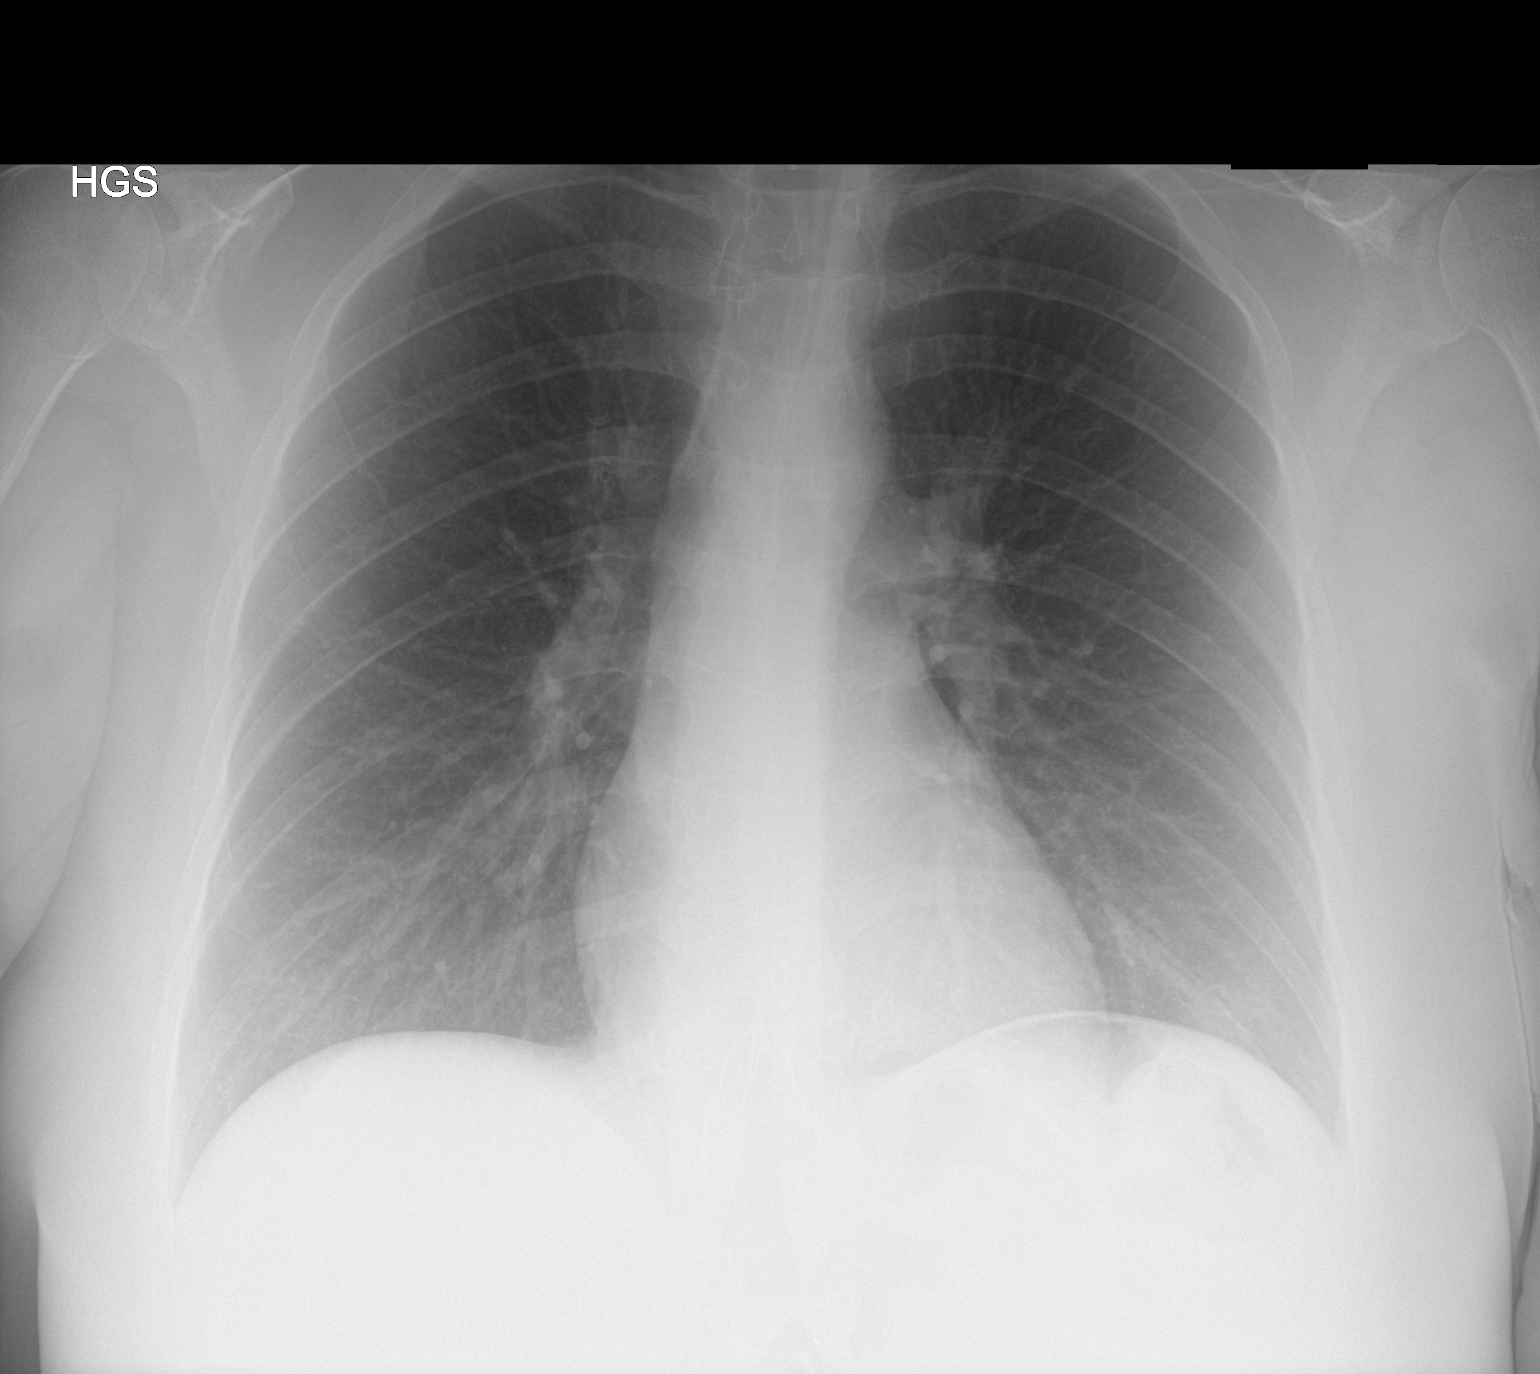

[1 of 1 positions shown; findings below may reference images not displayed]

FINDINGS: Heart size is normal. Mediastinal shadows are normal. The lungs are
clear. No effusions. Wire like foreign objects present within the
soft tissues of the anterior lower chest.
IMPRESSION: No active disease. Wire like foreign objects again demonstrated
within the anterior soft tissues of the chest.

## 2016-02-21 IMAGING — CR DG ABDOMEN 2V
3 series · 3 of 3 positions shown · non-contrast
Comparison: Chest x-ray [DATE], prior plain film of the abdomen
[DATE], prior frontal lateral chest x-ray [DATE]

CLINICAL DATA: 25-year-old female with a history of foreign body
ingestion

EXAM:
ABDOMEN - 2 VIEW

[abdomen erect]
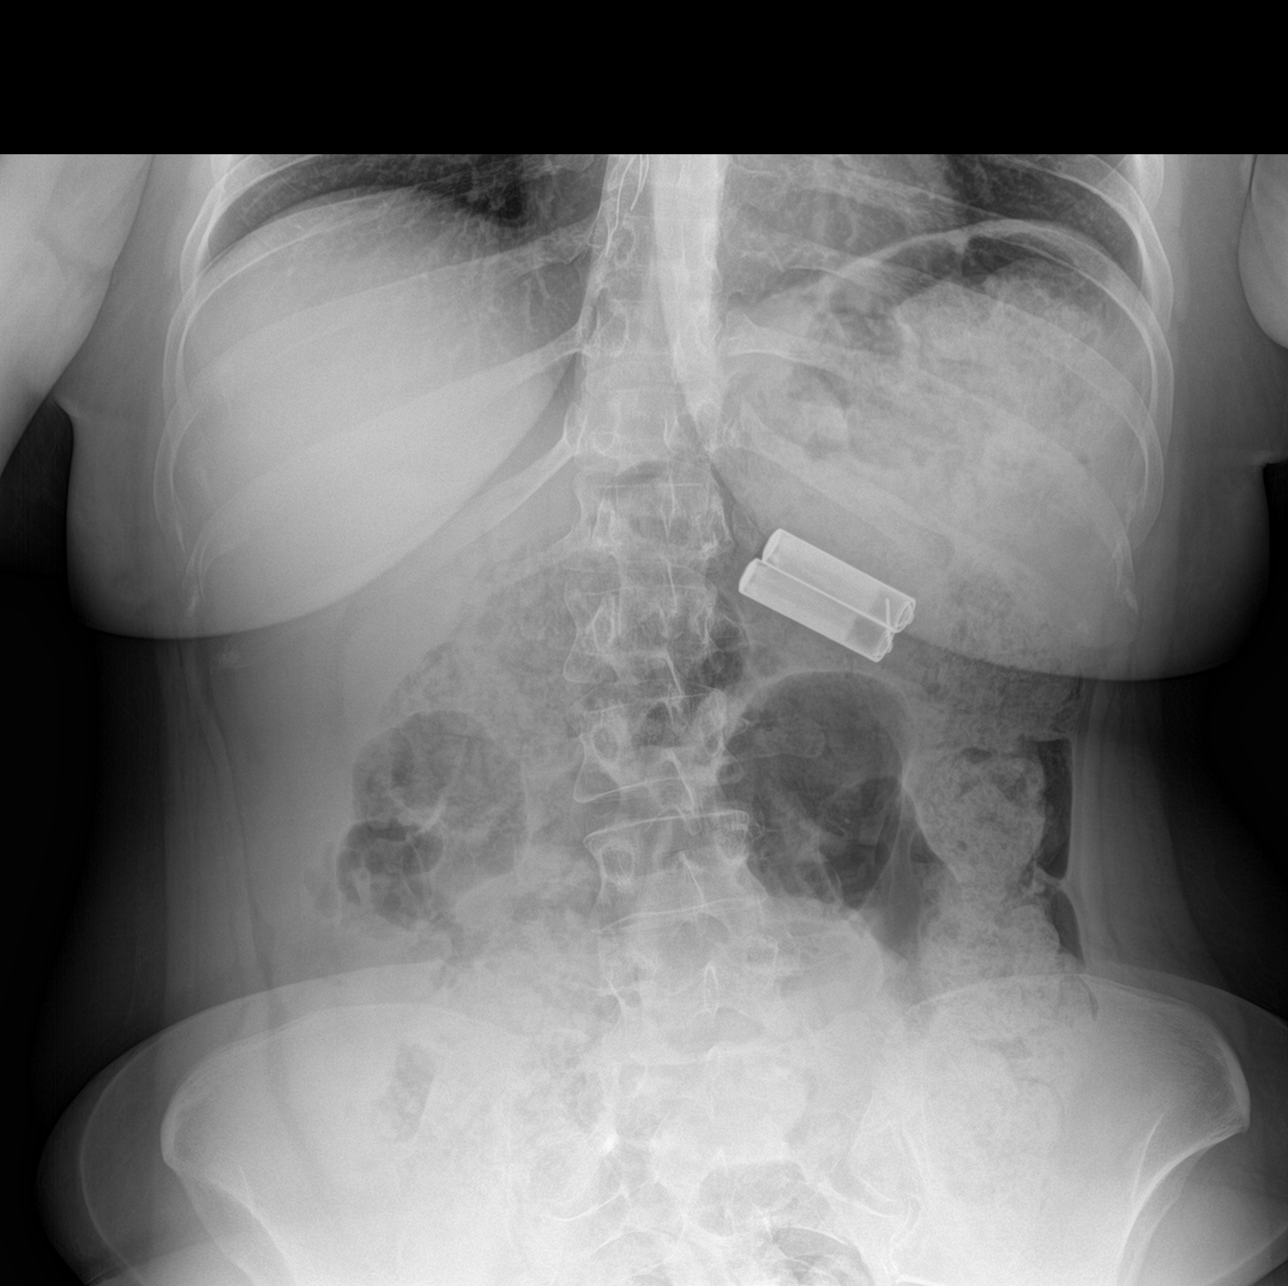

[abdomen supine (1 of 2)]
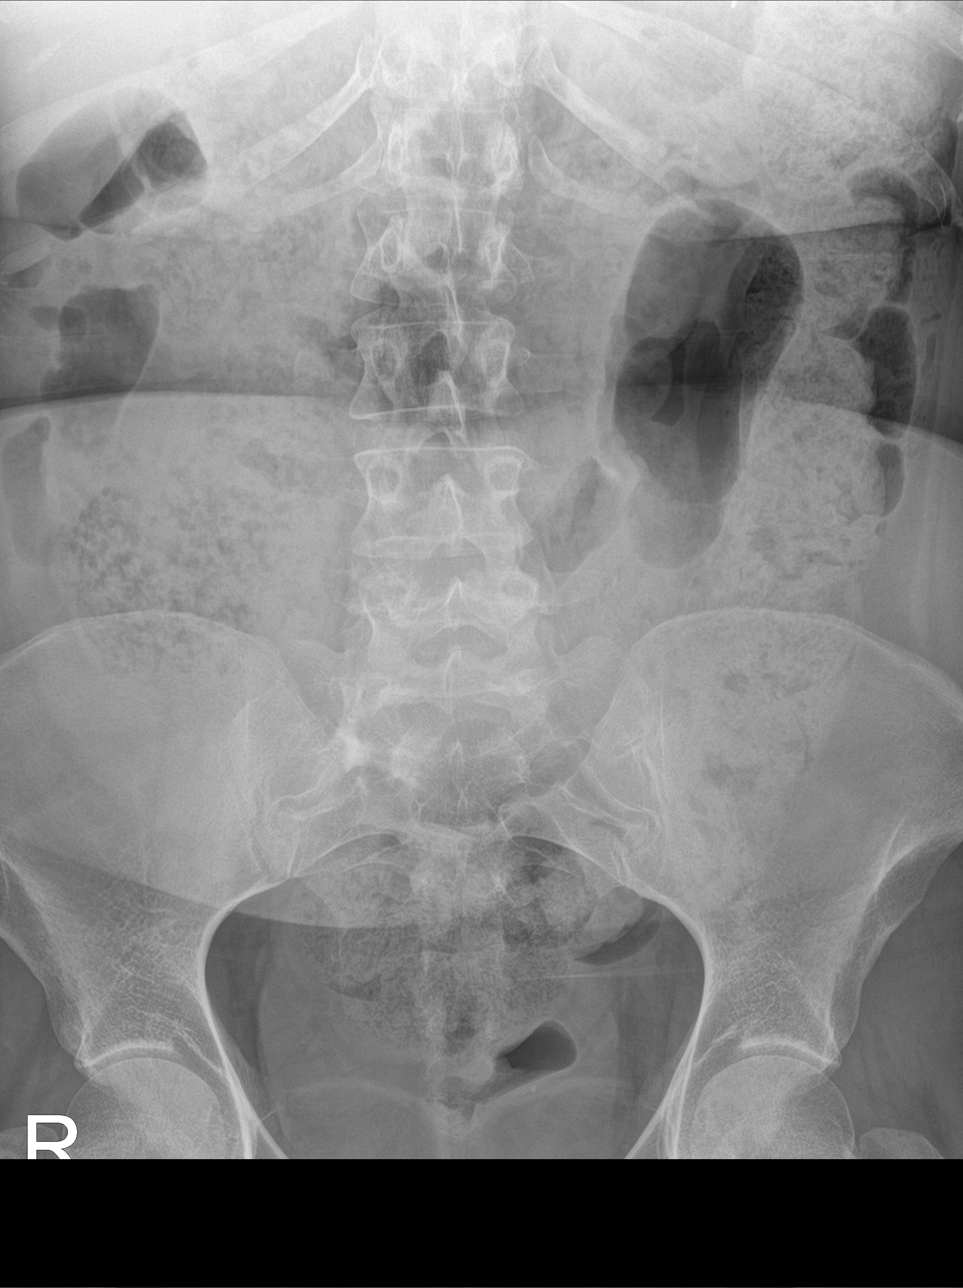

[abdomen supine (2 of 2)]
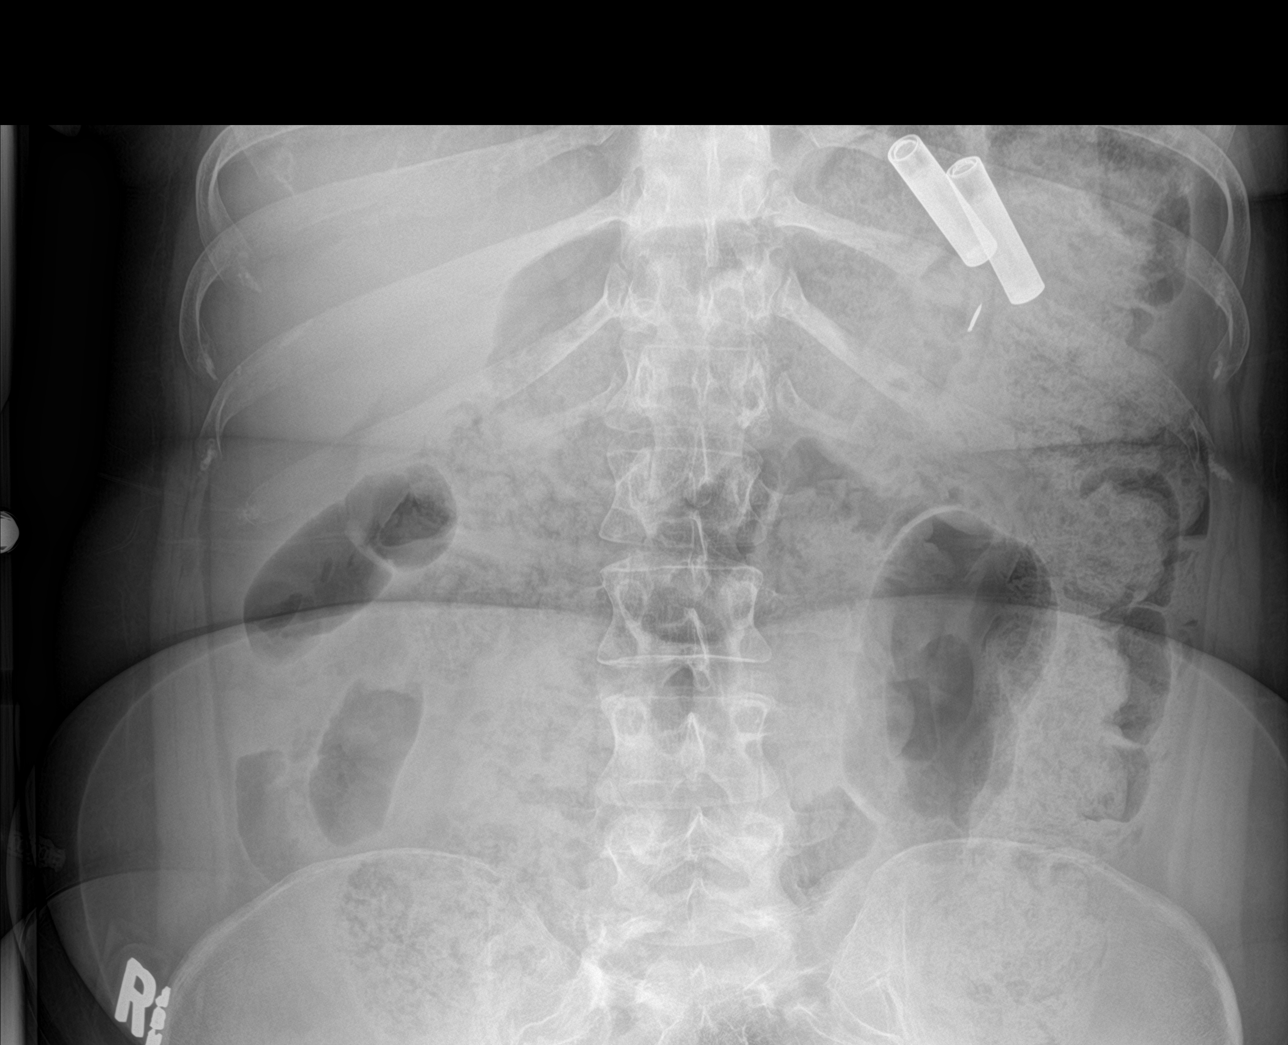

[3 of 3 positions shown; findings below may reference images not displayed]

FINDINGS: Again, radiopaque foreign bodies project over the left upper
quadrant, similar in position to the comparison plain film. These
are compatible with small linear metallic object such as a tan,
tack, or other metallic foreign body and 2 adjacent battery.

Linear metallic opacities project over the mediastinum, which have
been imaged on prior chest x-ray within the anterior soft tissues.

Advanced stool burden throughout the colon.

No evidence of abnormally distended small bowel.

No displaced fracture.
IMPRESSION: No significant change in position of the 3 separate radiopaque
foreign body of the upper abdomen (2 batteries and pin/tack),
favored to remain within the stomach lumen.

Advanced colonic stool burden.

Linear metallic foreign body projecting over the mediastinum,
unchanged from prior studies, located within the anterior soft
tissues on a prior lateral chest x-ray.

## 2016-02-21 SURGERY — EGD (ESOPHAGOGASTRODUODENOSCOPY)
Anesthesia: Moderate Sedation

## 2016-02-21 MED ORDER — MIDAZOLAM HCL 5 MG/ML IJ SOLN
INTRAMUSCULAR | Status: AC
  Filled 2016-02-21: qty 2

## 2016-02-21 MED ORDER — DIPHENHYDRAMINE HCL 50 MG/ML IJ SOLN
INTRAMUSCULAR | Status: AC
  Filled 2016-02-21: qty 1

## 2016-02-21 MED ORDER — FENTANYL CITRATE (PF) 100 MCG/2ML IJ SOLN
INTRAMUSCULAR | Status: DC | PRN
  Administered 2016-02-21 (×3): 25 ug via INTRAVENOUS

## 2016-02-21 MED ORDER — SUCRALFATE 1 G PO TABS
1.0000 g | ORAL_TABLET | Freq: Once | ORAL | Status: AC
  Administered 2016-02-21: 1 g via ORAL
  Filled 2016-02-21: qty 1

## 2016-02-21 MED ORDER — BUTAMBEN-TETRACAINE-BENZOCAINE 2-2-14 % EX AERO
INHALATION_SPRAY | CUTANEOUS | Status: DC | PRN
  Administered 2016-02-21: 2 via TOPICAL

## 2016-02-21 MED ORDER — DIPHENHYDRAMINE HCL 50 MG/ML IJ SOLN
INTRAMUSCULAR | Status: DC | PRN
  Administered 2016-02-21: 25 mg via INTRAVENOUS

## 2016-02-21 MED ORDER — ONDANSETRON HCL 4 MG/2ML IJ SOLN
4.0000 mg | INTRAMUSCULAR | Status: AC
  Administered 2016-02-21: 4 mg via INTRAVENOUS

## 2016-02-21 MED ORDER — FENTANYL CITRATE (PF) 100 MCG/2ML IJ SOLN
INTRAMUSCULAR | Status: AC
  Filled 2016-02-21: qty 4

## 2016-02-21 MED ORDER — PANTOPRAZOLE SODIUM 40 MG PO TBEC
40.0000 mg | DELAYED_RELEASE_TABLET | Freq: Once | ORAL | Status: AC
  Administered 2016-02-21: 40 mg via ORAL
  Filled 2016-02-21: qty 1

## 2016-02-21 MED ORDER — ONDANSETRON HCL 4 MG/2ML IJ SOLN
INTRAMUSCULAR | Status: AC
  Administered 2016-02-21: 4 mg via INTRAVENOUS
  Filled 2016-02-21: qty 2

## 2016-02-21 MED ORDER — SODIUM CHLORIDE 0.9 % IV SOLN
INTRAVENOUS | Status: DC
  Administered 2016-02-21: 15:00:00 via INTRAVENOUS

## 2016-02-21 MED ORDER — EPINEPHRINE HCL 0.1 MG/ML IJ SOSY
PREFILLED_SYRINGE | INTRAMUSCULAR | Status: AC
  Filled 2016-02-21: qty 10

## 2016-02-21 MED ORDER — MIDAZOLAM HCL 10 MG/2ML IJ SOLN
INTRAMUSCULAR | Status: DC | PRN
  Administered 2016-02-21 (×4): 2 mg via INTRAVENOUS

## 2016-02-21 NOTE — ED Triage Notes (Signed)
Pt to ED from Group home via EMS c/o suicide attempt. EMS reports pt swallowed 2 trippe A batteries and a thumbtack in an attempt to hurt self. Pt reports she wanted to hurt herself because she does not like the people at her group home. Pt alert and oriented, abdomen tender in epi gastric region upon palpation.

## 2016-02-21 NOTE — H&P (Signed)
[facility] History and Physical   Primary Care Physician:  Casilda Carls, MD   Reason for Procedure:  Foreign body ingestion (2 AA batteries and push pin) Plan:    EGD with foreign body removal     HPI: Melanie Bell is a 25 y.o. female presented to [facility] ER after ingesting AA batteries and push pin with suicidal ideation. Denies any nausea, vomiting, abdominal pain, melena or bright red blood per rectum    Past Medical History:  Diagnosis Date  . Anxiety   . Asthma   . Depression   . GERD (gastroesophageal reflux disease)   . Hallucinations 09/30/2014   Sizoaffective  . Hyperlipidemia   . Hypertension   . Tardive dyskinesia 10/2014   recent onset    Past Surgical History:  Procedure Laterality Date  . ABDOMINAL SURGERY     "years ago" to remove foreign objects  . APPENDECTOMY    . BREAST LUMPECTOMY Right   . COLONOSCOPY WITH PROPOFOL N/A 09/10/2015   Procedure: COLONOSCOPY WITH PROPOFOL;  Surgeon: [name], MD;  Location: [facility] ENDOSCOPY;  Service: Endoscopy;  Laterality: N/A;  . ESOPHAGOGASTRODUODENOSCOPY N/A 11/28/2014   Procedure: ESOPHAGOGASTRODUODENOSCOPY (EGD);  Surgeon: [name], MD;  Location: [facility] ENDOSCOPY;  Service: Endoscopy;  Laterality: N/A;  . LAPAROTOMY N/A 09/12/2015   Procedure: EXPLORATORY LAPAROTOMY;  Surgeon: Florene Glen, MD;  Location: [facility] ORS;  Service: General;  Laterality: N/A;  . SIGMOIDOSCOPY N/A 09/12/2015   Procedure: Lonell Face;  Surgeon: Florene Glen, MD;  Location: [facility] ORS;  Service: General;  Laterality: N/A;  . WISDOM TOOTH EXTRACTION      Prior to Admission medications   Medication Sig Start Date End Date Taking? Authorizing Provider  amLODipine (NORVASC) 2.5 MG tablet Take 1 tablet (2.5 mg total) by mouth daily. 06/19/15  Yes [name] [name], MD  benztropine (COGENTIN) 1 MG tablet Take 1 mg by mouth at bedtime.   Yes Historical Provider, MD  fluPHENAZine (PROLIXIN) 5 MG tablet Take 5-10  mg by mouth See admin instructions. Take 5 mg by mouth in the morning, take 5 mg by mouth in the afternoon and take 10 mg by mouth at bedtime.   Yes Historical Provider, MD  hydrOXYzine (VISTARIL) 50 MG capsule Take 50 mg by mouth 2 (two) times daily as needed for anxiety.   Yes Historical Provider, MD  levothyroxine (SYNTHROID, LEVOTHROID) 75 MCG tablet Take 1 tablet (75 mcg total) by mouth daily before breakfast. 06/19/15  Yes [name] [name], MD  Lurasidone HCl (LATUDA) 120 MG TABS Take 120 mg by mouth every evening.   Yes Historical Provider, MD  OXcarbazepine (TRILEPTAL) 150 MG tablet Take 1 tablet (150 mg total) by mouth 2 (two) times daily. 06/19/15  Yes [name] [name], MD  sulfamethoxazole-trimethoprim (BACTRIM DS,SEPTRA DS) 800-160 MG tablet Take 1 tablet by mouth 2 (two) times daily. 02/16/16 02/26/16 Yes Historical Provider, MD  traZODone (DESYREL) 50 MG tablet Take 50 mg by mouth at bedtime.   Yes Historical Provider, MD  venlafaxine XR (EFFEXOR-XR) 150 MG 24 hr capsule Take 1 capsule (150 mg total) by mouth daily with breakfast. 06/19/15  Yes [name] [name], MD  REGLAN 5 MG tablet Take 1 tablet (5 mg total) by mouth 3 (three) times daily. 12/13/15 12/14/15  Lavonia Drafts, MD    Current Facility-Administered Medications  Medication Dose Route Frequency Provider Last Rate Last Dose  . 0.9 %  sodium chloride infusion   Intravenous Continuous Gayl Ivanoff V Linell Shawn,  MD 20 mL/hr at 02/21/16 1446     Current Outpatient Prescriptions  Medication Sig Dispense Refill  . amLODipine (NORVASC) 2.5 MG tablet Take 1 tablet (2.5 mg total) by mouth daily. 30 tablet 0  . benztropine (COGENTIN) 1 MG tablet Take 1 mg by mouth at bedtime.    . fluPHENAZine (PROLIXIN) 5 MG tablet Take 5-10 mg by mouth See admin instructions. Take 5 mg by mouth in the morning, take 5 mg by mouth in the afternoon and take 10 mg by mouth at bedtime.    . hydrOXYzine (VISTARIL) 50 MG capsule Take 50 mg by mouth 2 (two)  times daily as needed for anxiety.    Marland Kitchen levothyroxine (SYNTHROID, LEVOTHROID) 75 MCG tablet Take 1 tablet (75 mcg total) by mouth daily before breakfast. 30 tablet 0  . Lurasidone HCl (LATUDA) 120 MG TABS Take 120 mg by mouth every evening.    . OXcarbazepine (TRILEPTAL) 150 MG tablet Take 1 tablet (150 mg total) by mouth 2 (two) times daily. 60 tablet 0  . sulfamethoxazole-trimethoprim (BACTRIM DS,SEPTRA DS) 800-160 MG tablet Take 1 tablet by mouth 2 (two) times daily.    . traZODone (DESYREL) 50 MG tablet Take 50 mg by mouth at bedtime.    Marland Kitchen venlafaxine XR (EFFEXOR-XR) 150 MG 24 hr capsule Take 1 capsule (150 mg total) by mouth daily with breakfast. 30 capsule 0  . REGLAN 5 MG tablet Take 1 tablet (5 mg total) by mouth 3 (three) times daily. 3 tablet 0    Allergies as of 02/21/2016 - Review Complete 02/21/2016  Allergen Reaction Noted  . Betadine [povidone iodine] Other (See Comments) 09/29/2014  . Iodine Rash 09/29/2014  . Shellfish-derived products Other (See Comments) 11/05/2014    Family History  Problem Relation Age of Onset  . Depression Mother   . Hypertension Mother     Social History   Social History  . Marital status: Single    Spouse name: N/A  . Number of children: N/A  . Years of education: N/A   Occupational History  . Not on file.   Social History Main Topics  . Smoking status: Current Every Day Smoker    Packs/day: 0.50    Years: 3.00    Types: Cigarettes  . Smokeless tobacco: Never Used  . Alcohol use No  . Drug use: No  . Sexual activity: No   Other Topics Concern  . Not on file   Social History Narrative  . No narrative on file    Review of Systems:  All other review of systems negative except as mentioned in the HPI.  Physical Exam: Vital signs in last 24 hours: Temp:  [98.3 F (36.8 C)] 98.3 F (36.8 C) (10/08 1227) Pulse Rate:  [64-80] 64 (10/08 1445) Resp:  [12-14] 12 (10/08 1445) BP: (104-123)/(58-77) 123/69 (10/08 1445) SpO2:   [99 %-100 %] 100 % (10/08 1445)   General:   Alert,  Well-developed, well-nourished, pleasant and cooperative in NAD Lungs:  Clear throughout to auscultation.   Heart:  Regular rate and rhythm; no murmurs, clicks, rubs,  or gallops. Abdomen:  Soft, nontender and nondistended. Normal bowel sounds.   Neuro/Psych:  Alert and cooperative. Normal mood and affect. A and O x 3   @K .Denzil Magnuson, MD [phone] Mon-Fri 8a-5p [phone] after 5p, weekends, holidays 02/21/2016 2:59 PM@
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
## 2016-02-21 NOTE — Op Note (Addendum)
[facility] Patient [name] Procedure Date : 02/21/2016 MRN: [id number] Attending MD: Mauri Pole , MD Date of Birth: Sep 22, 1990 CSN: [id number] Age: 25 Admit Type: Inpatient Procedure:                Upper GI endoscopy Indications:              Removal of foreign body in the stomach Providers:                Mauri Pole, MD, Kingsley Plan, RN,                            Corliss Parish, Technician Referring MD:              Medicines:                Fentanyl 75 micrograms IV, Midazolam 8 mg IV,                            Diphenhydramine 50 mg IV Complications:            No immediate complications. Estimated Blood Loss:     Estimated blood loss was minimal. Procedure:                Pre-Anesthesia Assessment:                           - Prior to the procedure, a History and Physical                            was performed, and patient medications and                            allergies were reviewed. The patient's tolerance of                            previous anesthesia was also reviewed. The risks                            and benefits of the procedure and the sedation                            options and risks were discussed with the patient.                            All questions were answered, and informed consent                            was obtained. Prior Anticoagulants: The patient has                            taken no previous anticoagulant or antiplatelet                            agents. ASA Grade Assessment: II - A patient with  mild systemic disease. After reviewing the risks                            and benefits, the patient was deemed in                            satisfactory condition to undergo the procedure.                           After obtaining informed consent, the endoscope was                            passed under direct vision. Throughout the      procedure, the patient's blood pressure, pulse, and                            oxygen saturations were monitored continuously. The                            [id number]) scope was introduced through the                            mouth, and advanced to the second part of duodenum.                            The upper GI endoscopy was accomplished without                            difficulty. The patient tolerated the procedure                            well. Scope In: Scope Out: Findings:      A few less than 1 mm bleeding erosions were found in the lower third of       the esophagus.      Multiple dispersed, less than 5 mm bleeding erosions were found in the       entire examined stomach. There were stigmata of recent bleeding.      2 AAA Batteries and Tack (push pin) were found in the gastric fundus.       Removal was accomplished with a Roth net.      The first portion of the duodenum and second portion of the duodenum       were normal. Impression:               - A few bleeding erosions in the lower third of the                            esophagus.                           - Bleeding erosive gastropathy.                           - 2 AAA Baterries and Tack (push pin) were found in  the stomach. Removal was successful.                           - Normal first portion of the duodenum and second                            portion of the duodenum. Moderate Sedation:      Moderate (conscious) sedation was administered by the endoscopy nurse       and supervised by the endoscopist. The following parameters were       monitored: oxygen saturation, heart rate, blood pressure, and response       to care. Total physician intraservice time was 30 minutes. Recommendation:           - Return patient to hospital/Psych ward for ongoing                            care.                           - Resume previous diet.                           - Continue  present medications.                           - No ibuprofen, naproxen, or other non-steroidal                            anti-inflammatory drugs for 2 weeks.                           - Use Protonix (pantoprazole) 40 mg PO daily for 2                            months.                           - Use sucralfate tablets 1 gram PO BID for 1 week.                           - Will sign off, available for any questions Procedure Code(s):        --- Professional ---                           [phone], Esophagogastroduodenoscopy, flexible,                            transoral; with removal of foreign body(s)                           99153, Moderate sedation services; each additional                            15 minutes intraservice time  [id number], Moderate sedation services provided by the                            same physician or other qualified health care                            professional performing a gastrointestinal                            endoscopic service that sedation supports,                            requiring the presence of an independent trained                            observer to assist in the monitoring of the                            patient's level of consciousness and physiological                            status; initial 15 minutes of intra-service time;                            patient age 17 years or older (additional time may                            be reported with [phone], as appropriate) Diagnosis Code(s):        --- Professional ---                           K31.89, Other diseases of stomach and duodenum                           K92.2, Gastrointestinal hemorrhage, unspecified                           K22.11, Ulcer of esophagus with bleeding                           T18.2XXA, Foreign body in stomach, initial encounter CPT copyright 2016 [employer]. All rights reserved. The codes documented in this report  are preliminary and upon coder review may  be revised to meet current compliance requirements. Mauri Pole, MD 02/21/2016 3:52:11 PM This report has been signed electronically. Number of Addenda: 0

## 2016-02-21 NOTE — ED Notes (Signed)
TTS being performed.  

## 2016-02-21 NOTE — BHH Counselor (Addendum)
Writer consulted with Mickel Baas, NP. Per Mickel Baas due to the Pt recently having batteries and thumbtack removed the Pt will remain in the ED and receive an am psych evaluation.   Lorenza Cambridge, [facility] Triage Specialist

## 2016-02-21 NOTE — ED Triage Notes (Signed)
Received pt from [facility] with c/o suicide attempt. Pt swallowed 2 batteries and a thumbtack. Pt had tests done at [facility] that showed 2 batteries and thumbtack. Pt c/o abdominal pain with nausea.
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
## 2016-02-21 NOTE — ED Provider Notes (Signed)
[facility] Provider Note   CSN: [id number] Arrival date & time: 02/21/16  [id number]     History   Chief Complaint Chief Complaint  Patient presents with  . Swallowed Foreign Body  . Suicide Attempt    HPI Melanie Bell is a 25 y.o. female.  HPI  Past Medical History:  Diagnosis Date  . Anxiety   . Asthma   . Depression   . GERD (gastroesophageal reflux disease)   . Hallucinations 09/30/2014   Sizoaffective  . Hyperlipidemia   . Hypertension   . Tardive dyskinesia 10/2014   recent onset    Patient Active Problem List   Diagnosis Date Noted  . Foreign body ingestion 07/31/2015  . Schizoaffective disorder, bipolar type ([place]) 11/06/2014  . Tobacco use disorder 09/30/2014  . Hypothyroidism 09/29/2014  . Hypertension 09/29/2014  . Constipation 09/29/2014    Past Surgical History:  Procedure Laterality Date  . ABDOMINAL SURGERY     "years ago" to remove foreign objects  . APPENDECTOMY    . BREAST LUMPECTOMY Right   . COLONOSCOPY WITH PROPOFOL N/A 09/10/2015   Procedure: COLONOSCOPY WITH PROPOFOL;  Surgeon: [name], MD;  Location: [facility] ENDOSCOPY;  Service: Endoscopy;  Laterality: N/A;  . ESOPHAGOGASTRODUODENOSCOPY N/A 11/28/2014   Procedure: ESOPHAGOGASTRODUODENOSCOPY (EGD);  Surgeon: [name], MD;  Location: [facility] ENDOSCOPY;  Service: Endoscopy;  Laterality: N/A;  . LAPAROTOMY N/A 09/12/2015   Procedure: EXPLORATORY LAPAROTOMY;  Surgeon: Florene Glen, MD;  Location: [facility] ORS;  Service: General;  Laterality: N/A;  . SIGMOIDOSCOPY N/A 09/12/2015   Procedure: Lonell Face;  Surgeon: Florene Glen, MD;  Location: [facility] ORS;  Service: General;  Laterality: N/A;  . WISDOM TOOTH EXTRACTION      OB History    Gravida Para Term Preterm AB Living   0 0 0 0 0 0   SAB TAB Ectopic Multiple Live Births   0 0 0 0         Home Medications    Prior to Admission medications   Medication Sig Start Date End Date Taking? Authorizing  Provider  amLODipine (NORVASC) 2.5 MG tablet Take 1 tablet (2.5 mg total) by mouth daily. 06/19/15   [name], MD  benztropine (COGENTIN) 1 MG tablet Take 1 mg by mouth at bedtime.    Historical Provider, MD  fluPHENAZine (PROLIXIN) 5 MG tablet Take 5-10 mg by mouth See admin instructions. Take 5 mg by mouth in the morning, take 5 mg by mouth in the afternoon and take 10 mg by mouth at bedtime.    Historical Provider, MD  hydrOXYzine (VISTARIL) 50 MG capsule Take 50 mg by mouth 2 (two) times daily as needed for anxiety.    Historical Provider, MD  levothyroxine (SYNTHROID, LEVOTHROID) 75 MCG tablet Take 1 tablet (75 mcg total) by mouth daily before breakfast. 06/19/15   [name] [name], MD  Lurasidone HCl (LATUDA) 120 MG TABS Take 120 mg by mouth every evening.    Historical Provider, MD  OXcarbazepine (TRILEPTAL) 150 MG tablet Take 1 tablet (150 mg total) by mouth 2 (two) times daily. 06/19/15   [name], MD  REGLAN 5 MG tablet Take 1 tablet (5 mg total) by mouth 3 (three) times daily. 12/13/15 12/14/15  Lavonia Drafts, MD  sulfamethoxazole-trimethoprim (BACTRIM DS,SEPTRA DS) 800-160 MG tablet Take 1 tablet by mouth 2 (two) times daily. 02/16/16 02/26/16  Historical Provider, MD  traZODone (DESYREL) 50 MG tablet Take 50 mg by mouth at bedtime.  Historical Provider, MD  venlafaxine XR (EFFEXOR-XR) 150 MG 24 hr capsule Take 1 capsule (150 mg total) by mouth daily with breakfast. 06/19/15   [name], MD    Family History Family History  Problem Relation Age of Onset  . Depression Mother   . Hypertension Mother     Social History Social History  Substance Use Topics  . Smoking status: Current Every Day Smoker    Packs/day: 0.50    Years: 3.00    Types: Cigarettes  . Smokeless tobacco: Never Used  . Alcohol use No     Allergies   Betadine [povidone iodine]; Iodine; and Shellfish-derived products   Review of Systems Review of Systems   Physical  Exam Updated Vital Signs BP 121/75 (BP Location: Left Arm)   Pulse 71   Temp 98.7 F (37.1 C) (Oral)   Resp 18   Ht 5\' 6"  (1.676 m)   Wt 201 lb (91.2 kg)   SpO2 99%   BMI 32.44 kg/m   Physical Exam   ED Treatments / Results  Labs (all labs ordered are listed, but only abnormal results are displayed) Labs Reviewed  ACETAMINOPHEN LEVEL - Abnormal; Notable for the following:       Result Value   Acetaminophen (Tylenol), Serum <10 (*)    All other components within normal limits  COMPREHENSIVE METABOLIC PANEL - Abnormal; Notable for the following:    ALT 7 (*)    All other components within normal limits  SALICYLATE LEVEL  CBC WITH DIFFERENTIAL/PLATELET  LIPASE, BLOOD  POCT PREGNANCY, URINE    EKG  EKG Interpretation None       Radiology Dg Chest 1 View  Result Date: 02/21/2016 CLINICAL DATA:  Ingested foreign objects. EXAM: CHEST 1 VIEW COMPARISON:  02/12/2016 FINDINGS: Heart size is normal. Mediastinal shadows are normal. The lungs are clear. No effusions. Wire like foreign objects present within the soft tissues of the anterior lower chest. IMPRESSION: No active disease. Wire like foreign objects again demonstrated within the anterior soft tissues of the chest. Electronically Signed   By: Nelson Chimes M.D.   On: 02/21/2016 09:45   Dg Abdomen 1 View  Result Date: 02/21/2016 CLINICAL DATA:  Ingested foreign objects. EXAM: ABDOMEN - 1 VIEW COMPARISON:  12/13/2015 FINDINGS: Foreign objects are present in the left upper quadrant of the abdomen, presumably in the stomach. Two of these appear to be small batteries and the third looks like a small nail or tack. Large amount of fecal matter present throughout the colon. IMPRESSION: Two batteries and a small nail or tack in the left upper quadrant, probably within the gastric fundus. Electronically Signed   By: Nelson Chimes M.D.   On: 02/21/2016 09:43   Dg Abd 2 Views  Result Date: 02/21/2016 CLINICAL DATA:  25 year old female  with a history of foreign body ingestion EXAM: ABDOMEN - 2 VIEW COMPARISON:  Chest x-ray 02/21/2016, prior plain film of the abdomen 02/21/2016, prior frontal lateral chest x-ray 12/12/2015 FINDINGS: Again, radiopaque foreign bodies project over the left upper quadrant, similar in position to the comparison plain film. These are compatible with small linear metallic object such as a tan, tack, or other metallic foreign body and 2 adjacent battery. Linear metallic opacities project over the mediastinum, which have been imaged on prior chest x-ray within the anterior soft tissues. Advanced stool burden throughout the colon. No evidence of abnormally distended small bowel. No displaced fracture. IMPRESSION: No significant change in position of the 3  separate radiopaque foreign body of the upper abdomen (2 batteries and pin/tack), favored to remain within the stomach lumen. Advanced colonic stool burden. Linear metallic foreign body projecting over the mediastinum, unchanged from prior studies, located within the anterior soft tissues on a prior lateral chest x-ray. Signed, Dulcy Fanny. Earleen Newport, DO [facility] Radiology Electronically Signed   By: Corrie Mckusick D.O.   On: 02/21/2016 13:24    Procedures Procedures (including critical care time)  Medications Ordered in ED Medications  ondansetron (ZOFRAN) injection 4 mg (4 mg Intravenous Given 02/21/16 1034)     Initial Impression / Assessment and Plan / ED Course  I have reviewed the triage vital signs and the nursing notes.  Pertinent labs & imaging results that were available during my care of the patient were reviewed by me and considered in my medical decision making (see chart for details).  Clinical Course      Final Clinical Impressions(s) / ED Diagnoses   Final diagnoses:  Swallowed foreign body, initial encounter    New Prescriptions Discharge Medication List as of 02/21/2016 11:43 AM        Nena Polio, MD 02/21/16 1515

## 2016-02-21 NOTE — Progress Notes (Signed)
LCSW called patients Guardian and left a message requesting a call back ( [employer])  Called patients group home provider Melanie Bell [phone], It was reported that on Tuesday GHP pulled a 3" bobby hair pin from patient foot and that again she has ingested a tack, and 2 batteries. GHP stated that her room only has the basics and its unknown where she is getting these items from. GHP asked if hospital could refrain from giving patient POP, Cheese Burgers and french fries as this has been instructions from pts guardian to modify and deter patients behaviors. LCSW agreed and informed ED RN of this request.  Guardian Melanie Bell) [phone] has been notified patient in hospital and she herself is having knee surgery ( resides in [place]) and will not be available for a month. Patient is allowed to return to group [facility]Fr[facility] Osw[facility]do [facility] [phone]  once she is medically cleared.   [employer] LCSW [phone]
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
## 2016-02-21 NOTE — ED Notes (Addendum)
Pt ambulated to POD C, belongings given to [place], [place]

## 2016-02-21 NOTE — ED Notes (Signed)
IVC papers - 2 copies - on chart from [facility]. Copy faxed to [facility] and copy sent to Medical Records. Per Velna Hatchet, [facility], pt will be re-assessed in am.

## 2016-02-21 NOTE — ED Notes (Signed)
Eating dinner w/o any difficulty.

## 2016-02-22 ENCOUNTER — Encounter (HOSPITAL_COMMUNITY): Payer: Self-pay | Admitting: Gastroenterology

## 2016-02-22 DIAGNOSIS — T189XXA Foreign body of alimentary tract, part unspecified, initial encounter: Secondary | ICD-10-CM

## 2016-02-22 DIAGNOSIS — F17210 Nicotine dependence, cigarettes, uncomplicated: Secondary | ICD-10-CM | POA: Diagnosis not present

## 2016-02-22 DIAGNOSIS — T1491XA Suicide attempt, initial encounter: Secondary | ICD-10-CM

## 2016-02-22 DIAGNOSIS — Z8249 Family history of ischemic heart disease and other diseases of the circulatory system: Secondary | ICD-10-CM | POA: Diagnosis not present

## 2016-02-22 DIAGNOSIS — X838XXA Intentional self-harm by other specified means, initial encounter: Secondary | ICD-10-CM

## 2016-02-22 DIAGNOSIS — Z818 Family history of other mental and behavioral disorders: Secondary | ICD-10-CM | POA: Diagnosis not present

## 2016-02-22 NOTE — ED Notes (Signed)
Per Group Home Director Ms. Watlington. Kermit Balo to pick up patient in the next 20-30 minutes.

## 2016-02-22 NOTE — Progress Notes (Signed)
Telepsych evaluation of pt complete- recommends pt psychiatrically stable to d/c and return home to group home. Called pt's group home (Ms. Allene Pyo [phone]), no answer, voicemail box full. Will continue attempting to inform group home of plan for d/c.  Sharren Bridge, MSW, LCSW Clinical Social Work, Disposition  02/22/2016 [phone]

## 2016-02-22 NOTE — ED Notes (Signed)
IVC rescind paper work faxed to [employer] completed and placed in the red folder.

## 2016-02-22 NOTE — ED Notes (Signed)
NO IV present.

## 2016-02-22 NOTE — ED Notes (Signed)
Pts belongings returned. Pt getting dressed.

## 2016-02-22 NOTE — ED Notes (Signed)
telepsych completed. Patient has no needs; sitter at bedside.

## 2016-02-22 NOTE — ED Notes (Signed)
Pt calm and cooperative. Denies pain

## 2016-02-22 NOTE — ED Notes (Signed)
Group Home Director: Telephone conversation, per Ms. Wattlington pt will be picked up at discharge by group home associate. This RN provided Ms. [place] with contact number. Pt is ready for discharge.

## 2016-02-22 NOTE — ED Notes (Signed)
Pt now reports she is having thoughts of wanting to harm self.

## 2016-02-22 NOTE — Progress Notes (Signed)
Reviewed pt's case and discussed with psych team. Pt well known to psych team due to previous ED encounters under similar circumstances.  Spoke with pt's group home provider Ms. Allene Pyo [phone]. Ms. Watlington states, "Burnie has lived here for years and we love her, but we are concerned that she will need more than we can provide going forward. We don't know how she is accessing the objects she ingests. We do 30 minute checks with her and still she is able to find these things." Ms. Watlington states pt has not been d/c'd from group home "but we are talking about it with her."  CSW informed Ms. Watlington pt is to be evaluated via psychiatry for disposition recommendation today. Ms. Oswaldo Milian states, "If we can get confirmation there are no foreign bodies in her, we can take her back home." CSW directed her to discuss concerns with medical staff. Advised her CSW will update her on outcome of psychiatric evaluation.  Sharren Bridge, MSW, LCSW Clinical Social Work, Disposition  02/22/2016 [phone]

## 2016-02-22 NOTE — Progress Notes (Signed)
Spoke with Ms. Watlington with pt's group home [phone], aware pt is ready for d/c. States she will be dispatching staff, will be coming from [place] to pick pt up.  Asks details re: removal of foreign bodies, CSW directed her to medical staff.   Sharren Bridge, MSW, LCSW Clinical Social Work, Disposition  02/22/2016 [phone]

## 2016-02-22 NOTE — ED Notes (Addendum)
Pt has showered; states she feels better. Sitter at bedside; no needs.

## 2016-02-22 NOTE — Consult Note (Signed)
Telepsych Consultation   Reason for Consult: Foreign body ingestion Referring Physician:  EDP Patient Identification: Melanie Bell MRN:  [id number] Principal Diagnosis: <principal problem not specified> Diagnosis:   Patient Active Problem List   Diagnosis Date Noted  . Foreign body ingestion, initial encounter [T18.9XXA] 07/31/2015  . Schizoaffective disorder, bipolar type ([place]) [F25.0] 11/06/2014  . Tobacco use disorder [F17.200] 09/30/2014  . Hypothyroidism [E03.9] 09/29/2014  . Hypertension [I10] 09/29/2014  . Constipation [K59.00] 09/29/2014    Total Time spent with patient: 30 minutes  Subjective:   Melanie Bell is a 25 y.o. female patient admitted with foreign body ingestion.  HPI:  Melanie Bell is a 25 year old female who presented to the [facility] after ingesting 2 AA batteries and a thumbtack. Pt has a long history of foreign body ingestion and this is her baseline behavior when she is upset. She has had multiple ED admissions for the same behavior in the past.She resides in a group home and is under continuous supervision and the group home is willing to take her back once she is medically cleared for discharge.  She states " I wanted to hurt myself because I was upset."  She continues to make self-injurious statements but based on history of chronic self-injurious statements she is at her baseline and is stable for discharge back to group home. Group home is aware that she continues these behaviors and 30-minute checks are performed to help prevent these behaviors. Group home is aware they may need to increase the monitoring of patient and watch her more closely due to continued thoughts of self-injury.  Past Psychiatric History: Depression, schizoaffective disorder, foreign body ingestion  Risk to Self: Suicidal Ideation: Yes-Currently Present Suicidal Intent: Yes-Currently Present Is patient at risk for suicide?: Yes Suicidal Plan?: Yes-Currently Present Specify  Current Suicidal Plan: to swallow batteries Access to Means: Yes Specify Access to Suicidal Means: access to batteries What has been your use of drugs/alcohol within the last 12 months?: NA How many times?: 10 Other Self Harm Risks: swallowing items Triggers for Past Attempts: Unpredictable Intentional Self Injurious Behavior: Damaging Comment - Self Injurious Behavior: swallowing items Risk to Others: Homicidal Ideation: No Thoughts of Harm to Others: No Current Homicidal Intent: No Current Homicidal Plan: No Access to Homicidal Means: No Identified Victim: NA History of harm to others?: No Assessment of Violence: None Noted Violent Behavior Description: NA Does patient have access to weapons?: No Criminal Charges Pending?: No Does patient have a court date: No Prior Inpatient Therapy: Prior Inpatient Therapy: Yes Prior Therapy Dates: 2017 Prior Therapy Facilty/Provider(s): [facility] Reason for Treatment: SI Prior Outpatient Therapy: Prior Outpatient Therapy: Yes Prior Therapy Dates: current Prior Therapy Facilty/Provider(s): [facility] Reason for Treatment: Schizoprhenia Does patient have an ACCT team?: Yes Does patient have Intensive In-House Services?  : No Does patient have [facility] services? : No Does patient have P4CC services?: No  Past Medical History:  Past Medical History:  Diagnosis Date  . Anxiety   . Asthma   . Depression   . GERD (gastroesophageal reflux disease)   . Hallucinations 09/30/2014   Sizoaffective  . Hyperlipidemia   . Hypertension   . Tardive dyskinesia 10/2014   recent onset    Past Surgical History:  Procedure Laterality Date  . ABDOMINAL SURGERY     "years ago" to remove foreign objects  . APPENDECTOMY    . BREAST LUMPECTOMY Right   . COLONOSCOPY WITH PROPOFOL N/A 09/10/2015   Procedure: COLONOSCOPY WITH PROPOFOL;  Surgeon: [name], MD;  Location: [facility] ENDOSCOPY;  Service: Endoscopy;  Laterality: N/A;  . ESOPHAGOGASTRODUODENOSCOPY N/A  11/28/2014   Procedure: ESOPHAGOGASTRODUODENOSCOPY (EGD);  Surgeon: [name], MD;  Location: [facility] ENDOSCOPY;  Service: Endoscopy;  Laterality: N/A;  . LAPAROTOMY N/A 09/12/2015   Procedure: EXPLORATORY LAPAROTOMY;  Surgeon: Florene Glen, MD;  Location: [facility] ORS;  Service: General;  Laterality: N/A;  . SIGMOIDOSCOPY N/A 09/12/2015   Procedure: Lonell Face;  Surgeon: Florene Glen, MD;  Location: [facility] ORS;  Service: General;  Laterality: N/A;  . WISDOM TOOTH EXTRACTION     Family History:  Family History  Problem Relation Age of Onset  . Depression Mother   . Hypertension Mother    Family Psychiatric  History: unknown Social History:  History  Alcohol Use No     History  Drug Use No    Social History   Social History  . Marital status: Single    Spouse name: N/A  . Number of children: N/A  . Years of education: N/A   Social History Main Topics  . Smoking status: Current Every Day Smoker    Packs/day: 0.50    Years: 3.00    Types: Cigarettes  . Smokeless tobacco: Never Used  . Alcohol use No  . Drug use: No  . Sexual activity: No   Other Topics Concern  . None   Social History Narrative  . None   Additional Social History:    Allergies:   Allergies  Allergen Reactions  . Betadine [Povidone Iodine] Other (See Comments)    Reaction:  Unknown   . Iodine Rash  . Shellfish-Derived Products Other (See Comments)    Reaction:  Unknown     Labs:  Results for orders placed or performed during the hospital encounter of 02/21/16 (from the past 48 hour(s))  Pregnancy, urine POC     Status: None   Collection Time: 02/21/16  9:05 AM  Result Value Ref Range   Preg Test, Ur NEGATIVE NEGATIVE    Comment:        THE SENSITIVITY OF THIS METHODOLOGY IS >24 mIU/mL   Acetaminophen level     Status: Abnormal   Collection Time: 02/21/16  9:11 AM  Result Value Ref Range   Acetaminophen (Tylenol), Serum <10 (L) 10 - 30 ug/mL    Comment:        THERAPEUTIC  CONCENTRATIONS VARY SIGNIFICANTLY. A RANGE OF 10-30 ug/mL MAY BE AN EFFECTIVE CONCENTRATION FOR MANY PATIENTS. HOWEVER, SOME ARE BEST TREATED AT CONCENTRATIONS OUTSIDE THIS RANGE. ACETAMINOPHEN CONCENTRATIONS >150 ug/mL AT 4 HOURS AFTER INGESTION AND >50 ug/mL AT 12 HOURS AFTER INGESTION ARE OFTEN ASSOCIATED WITH TOXIC REACTIONS.   Salicylate level     Status: None   Collection Time: 02/21/16  9:11 AM  Result Value Ref Range   Salicylate Lvl <2.2 2.8 - 30.0 mg/dL  Comprehensive metabolic panel     Status: Abnormal   Collection Time: 02/21/16  9:11 AM  Result Value Ref Range   Sodium 137 135 - 145 mmol/L   Potassium 4.2 3.5 - 5.1 mmol/L   Chloride 106 101 - 111 mmol/L   CO2 26 22 - 32 mmol/L   Glucose, Bld 92 65 - 99 mg/dL   BUN 8 6 - 20 mg/dL   Creatinine, Ser 0.76 0.44 - 1.00 mg/dL   Calcium 9.4 8.9 - 10.3 mg/dL   Total Protein 7.4 6.5 - 8.1 g/dL   Albumin 3.8 3.5 - 5.0 g/dL  AST 18 15 - 41 U/L   ALT 7 (L) 14 - 54 U/L   Alkaline Phosphatase 72 38 - 126 U/L   Total Bilirubin 0.5 0.3 - 1.2 mg/dL   GFR calc non Af Amer >60 >60 mL/min   GFR calc Af Amer >60 >60 mL/min    Comment: (NOTE) The eGFR has been calculated using the CKD EPI equation. This calculation has not been validated in all clinical situations. eGFR's persistently <60 mL/min signify possible Chronic Kidney Disease.    Anion gap 5 5 - 15  CBC with Differential     Status: None   Collection Time: 02/21/16  9:11 AM  Result Value Ref Range   WBC 6.0 3.6 - 11.0 K/uL   RBC 4.04 3.80 - 5.20 MIL/uL   Hemoglobin 12.4 12.0 - 16.0 g/dL   HCT 35.7 35.0 - 47.0 %   MCV 88.3 80.0 - 100.0 fL   MCH 30.8 26.0 - 34.0 pg   MCHC 34.9 32.0 - 36.0 g/dL   RDW 13.0 11.5 - 14.5 %   Platelets 241 150 - 440 K/uL   Neutrophils Relative % 57 %   Neutro Abs 3.4 1.4 - 6.5 K/uL   Lymphocytes Relative 28 %   Lymphs Abs 1.7 1.0 - 3.6 K/uL   Monocytes Relative 8 %   Monocytes Absolute 0.5 0.2 - 0.9 K/uL   Eosinophils Relative 6  %   Eosinophils Absolute 0.4 0 - 0.7 K/uL   Basophils Relative 1 %   Basophils Absolute 0.1 0 - 0.1 K/uL  Lipase, blood     Status: None   Collection Time: 02/21/16  9:11 AM  Result Value Ref Range   Lipase 22 11 - 51 U/L    No current facility-administered medications for this encounter.    Current Outpatient Prescriptions  Medication Sig Dispense Refill  . amLODipine (NORVASC) 2.5 MG tablet Take 1 tablet (2.5 mg total) by mouth daily. 30 tablet 0  . benztropine (COGENTIN) 1 MG tablet Take 1 mg by mouth at bedtime.    . fluPHENAZine (PROLIXIN) 5 MG tablet Take 5-10 mg by mouth See admin instructions. Take 5 mg by mouth in the morning, take 5 mg by mouth in the afternoon and take 10 mg by mouth at bedtime.    . hydrOXYzine (VISTARIL) 50 MG capsule Take 50 mg by mouth 2 (two) times daily as needed for anxiety.    Marland Kitchen levothyroxine (SYNTHROID, LEVOTHROID) 75 MCG tablet Take 1 tablet (75 mcg total) by mouth daily before breakfast. 30 tablet 0  . Lurasidone HCl (LATUDA) 120 MG TABS Take 120 mg by mouth every evening.    . OXcarbazepine (TRILEPTAL) 150 MG tablet Take 1 tablet (150 mg total) by mouth 2 (two) times daily. 60 tablet 0  . sulfamethoxazole-trimethoprim (BACTRIM DS,SEPTRA DS) 800-160 MG tablet Take 1 tablet by mouth 2 (two) times daily.    . traZODone (DESYREL) 50 MG tablet Take 50 mg by mouth at bedtime.    Marland Kitchen venlafaxine XR (EFFEXOR-XR) 150 MG 24 hr capsule Take 1 capsule (150 mg total) by mouth daily with breakfast. 30 capsule 0  . REGLAN 5 MG tablet Take 1 tablet (5 mg total) by mouth 3 (three) times daily. 3 tablet 0    Musculoskeletal: unable to assess on camera  Psychiatric Specialty Exam: Physical Exam  Review of Systems  Psychiatric/Behavioral: Positive for depression. Negative for hallucinations, memory loss, substance abuse and suicidal ideas. The patient is not nervous/anxious and does not  have insomnia.   All other systems reviewed and are negative.   Blood  pressure 120/66, pulse 80, temperature 98.2 F (36.8 C), temperature source Oral, resp. rate 18, SpO2 96 %.There is no height or weight on file to calculate BMI.  General Appearance: Casual  Eye Contact:  Good  Speech:  Clear and Coherent  Volume:  Normal  Mood:  Depressed  Affect:  Depressed and Flat  Thought Process:  Coherent  Orientation:  Full (Time, Place, and Person)  Thought Content:  Logical  Suicidal Thoughts:  continues to make self-injurous staements which is baseline  Homicidal Thoughts:  No  Memory:  Immediate;   Good Recent;   Good Remote;   Good  Judgement:  Fair  Insight:  Fair  Psychomotor Activity:  Negative  Concentration:  Concentration: Good and Attention Span: Good  Recall:  Good  Fund of Knowledge:  Fair  Language:  Good  Akathisia:  No  Handed:  Right  AIMS (if indicated):     Assets:  Agricultural consultant Housing Physical Health Social Support  ADL's:  Intact  Cognition:  WNL  Sleep:        Treatment Plan Summary: Discharge back to Group Home, behavior is at baseline. Group Home aware they will have to increase monitoring of Pt to prevent self-injurious behaviors.   Case discussed with Dr Dwyane Dee who agrees that pt is at baseline and these are chronic behaviors. Pt is stable to discharge back to group home.   Disposition: Patient does not meet criteria for psychiatric inpatient admission.  Ethelene Hal, NP 02/22/2016 11:07 AM

## 2016-02-22 NOTE — ED Notes (Signed)
Pt sitting up eating breakfast; states she is feeling better but still depressed; denies feeling suicidal or wanting to hurt herself. Sitter at bedside; no needs.

## 2016-02-29 ENCOUNTER — Encounter: Payer: Self-pay | Admitting: Emergency Medicine

## 2016-02-29 ENCOUNTER — Emergency Department: Payer: Medicaid Other

## 2016-02-29 ENCOUNTER — Emergency Department: Payer: Medicaid Other | Admitting: Anesthesiology

## 2016-02-29 ENCOUNTER — Encounter
Admission: EM | Disposition: A | Discharge status: Home / Self Care | Payer: Self-pay | Source: Home / Self Care | Attending: Emergency Medicine

## 2016-02-29 ENCOUNTER — Emergency Department
Admission: EM | Admit: 2016-02-29 | Discharge: 2016-02-29 | Disposition: A | Discharge status: Home / Self Care | Payer: Medicaid Other | Attending: Emergency Medicine | Admitting: Emergency Medicine

## 2016-02-29 DIAGNOSIS — Z8249 Family history of ischemic heart disease and other diseases of the circulatory system: Secondary | ICD-10-CM | POA: Diagnosis not present

## 2016-02-29 DIAGNOSIS — X58XXXA Exposure to other specified factors, initial encounter: Secondary | ICD-10-CM | POA: Diagnosis not present

## 2016-02-29 DIAGNOSIS — T182XXD Foreign body in stomach, subsequent encounter: Secondary | ICD-10-CM | POA: Diagnosis not present

## 2016-02-29 DIAGNOSIS — Z79899 Other long term (current) drug therapy: Secondary | ICD-10-CM | POA: Diagnosis not present

## 2016-02-29 DIAGNOSIS — Z91041 Radiographic dye allergy status: Secondary | ICD-10-CM | POA: Diagnosis not present

## 2016-02-29 DIAGNOSIS — E785 Hyperlipidemia, unspecified: Secondary | ICD-10-CM | POA: Diagnosis not present

## 2016-02-29 DIAGNOSIS — F250 Schizoaffective disorder, bipolar type: Secondary | ICD-10-CM

## 2016-02-29 DIAGNOSIS — E039 Hypothyroidism, unspecified: Secondary | ICD-10-CM | POA: Diagnosis not present

## 2016-02-29 DIAGNOSIS — F17210 Nicotine dependence, cigarettes, uncomplicated: Secondary | ICD-10-CM | POA: Insufficient documentation

## 2016-02-29 DIAGNOSIS — K219 Gastro-esophageal reflux disease without esophagitis: Secondary | ICD-10-CM | POA: Insufficient documentation

## 2016-02-29 DIAGNOSIS — J45909 Unspecified asthma, uncomplicated: Secondary | ICD-10-CM | POA: Diagnosis not present

## 2016-02-29 DIAGNOSIS — X838XXA Intentional self-harm by other specified means, initial encounter: Secondary | ICD-10-CM | POA: Diagnosis not present

## 2016-02-29 DIAGNOSIS — G249 Dystonia, unspecified: Secondary | ICD-10-CM | POA: Diagnosis not present

## 2016-02-29 DIAGNOSIS — R443 Hallucinations, unspecified: Secondary | ICD-10-CM | POA: Diagnosis not present

## 2016-02-29 DIAGNOSIS — F329 Major depressive disorder, single episode, unspecified: Secondary | ICD-10-CM | POA: Diagnosis not present

## 2016-02-29 DIAGNOSIS — F419 Anxiety disorder, unspecified: Secondary | ICD-10-CM | POA: Diagnosis not present

## 2016-02-29 DIAGNOSIS — T182XXA Foreign body in stomach, initial encounter: Secondary | ICD-10-CM | POA: Diagnosis not present

## 2016-02-29 DIAGNOSIS — Y9389 Activity, other specified: Secondary | ICD-10-CM | POA: Diagnosis not present

## 2016-02-29 DIAGNOSIS — I10 Essential (primary) hypertension: Secondary | ICD-10-CM | POA: Insufficient documentation

## 2016-02-29 DIAGNOSIS — T189XXA Foreign body of alimentary tract, part unspecified, initial encounter: Secondary | ICD-10-CM

## 2016-02-29 DIAGNOSIS — Z888 Allergy status to other drugs, medicaments and biological substances status: Secondary | ICD-10-CM | POA: Diagnosis not present

## 2016-02-29 DIAGNOSIS — K5900 Constipation, unspecified: Secondary | ICD-10-CM | POA: Diagnosis not present

## 2016-02-29 DIAGNOSIS — R45851 Suicidal ideations: Secondary | ICD-10-CM | POA: Diagnosis not present

## 2016-02-29 DIAGNOSIS — T18198A Other foreign object in esophagus causing other injury, initial encounter: Secondary | ICD-10-CM | POA: Diagnosis not present

## 2016-02-29 DIAGNOSIS — Z91013 Allergy to seafood: Secondary | ICD-10-CM | POA: Diagnosis not present

## 2016-02-29 DIAGNOSIS — Y999 Unspecified external cause status: Secondary | ICD-10-CM | POA: Diagnosis not present

## 2016-02-29 DIAGNOSIS — Z818 Family history of other mental and behavioral disorders: Secondary | ICD-10-CM | POA: Diagnosis not present

## 2016-02-29 DIAGNOSIS — F209 Schizophrenia, unspecified: Secondary | ICD-10-CM | POA: Insufficient documentation

## 2016-02-29 HISTORY — PX: ESOPHAGOGASTRODUODENOSCOPY (EGD) WITH PROPOFOL: SHX5813

## 2016-02-29 LAB — POCT PREGNANCY, URINE: Preg Test, Ur: NEGATIVE

## 2016-02-29 IMAGING — CR DG ABDOMEN 1V
2 series · 2 of 2 positions shown · non-contrast
Comparison: [DATE]

CLINICAL DATA: Swallowed battery today

EXAM:
ABDOMEN - 1 VIEW

[abdomen kub (1 of 2)]
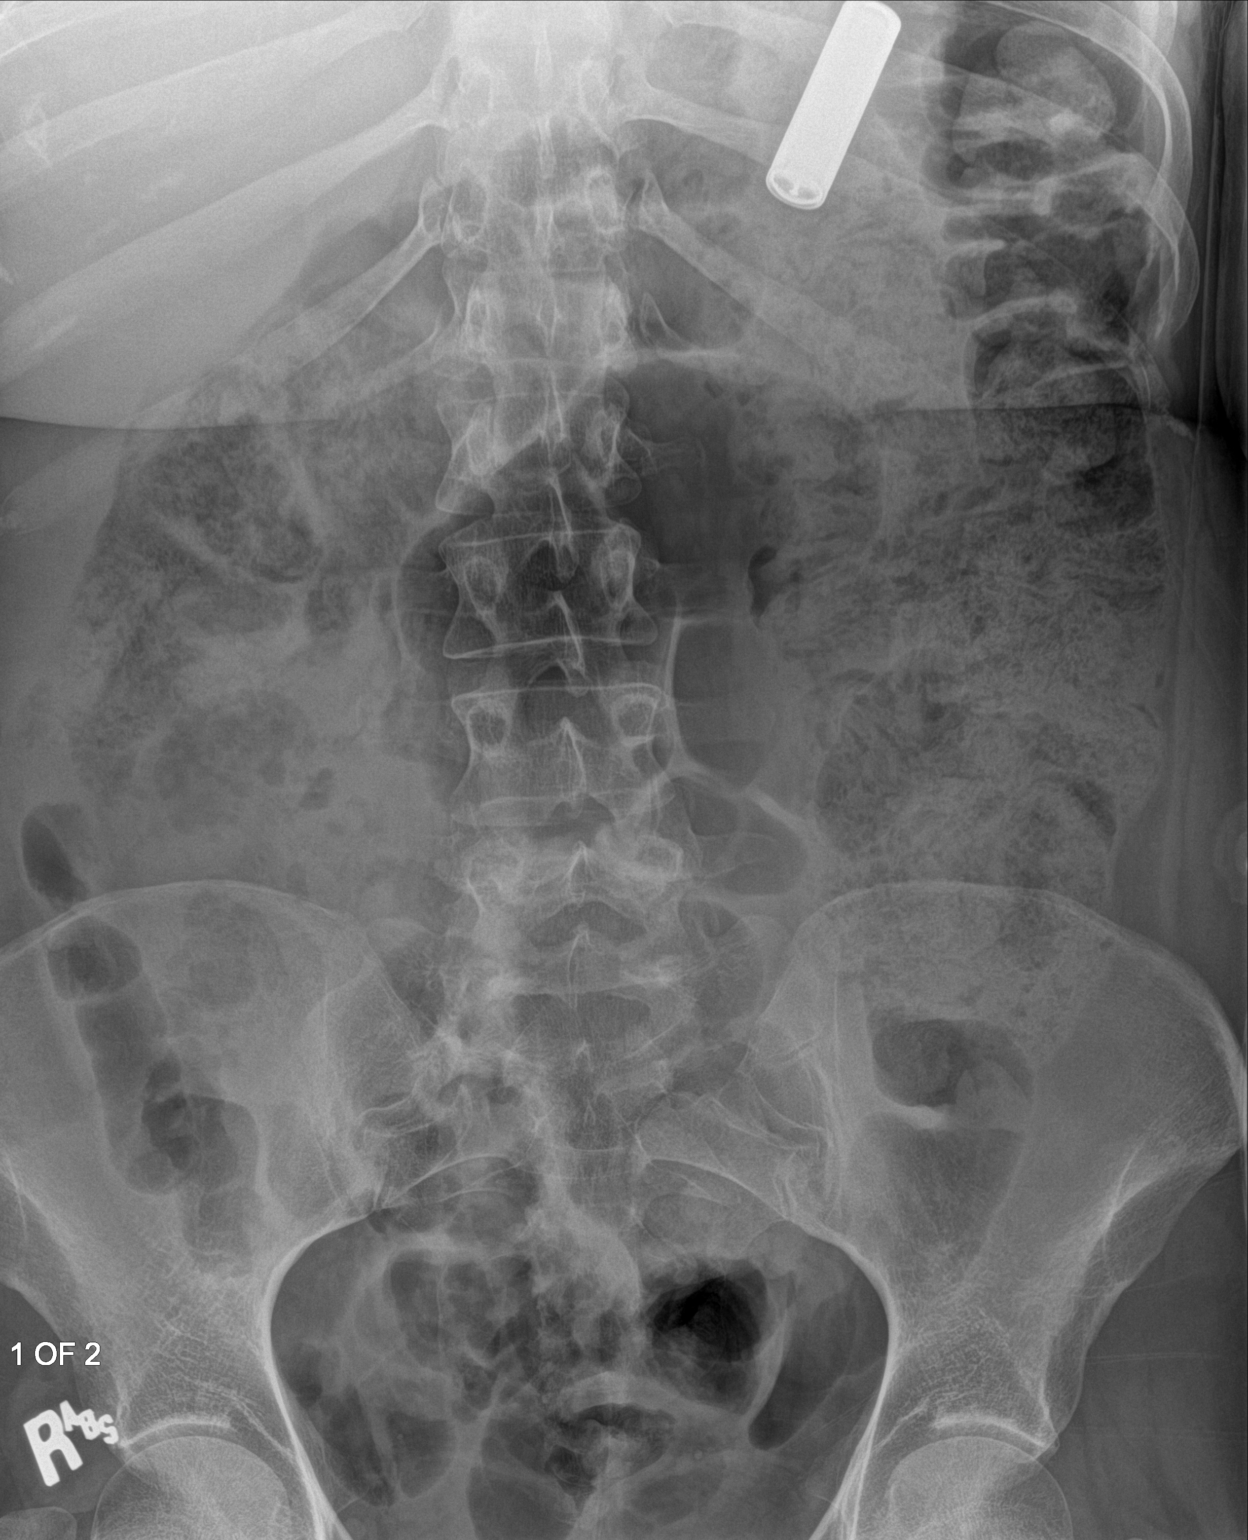

[abdomen kub (2 of 2)]
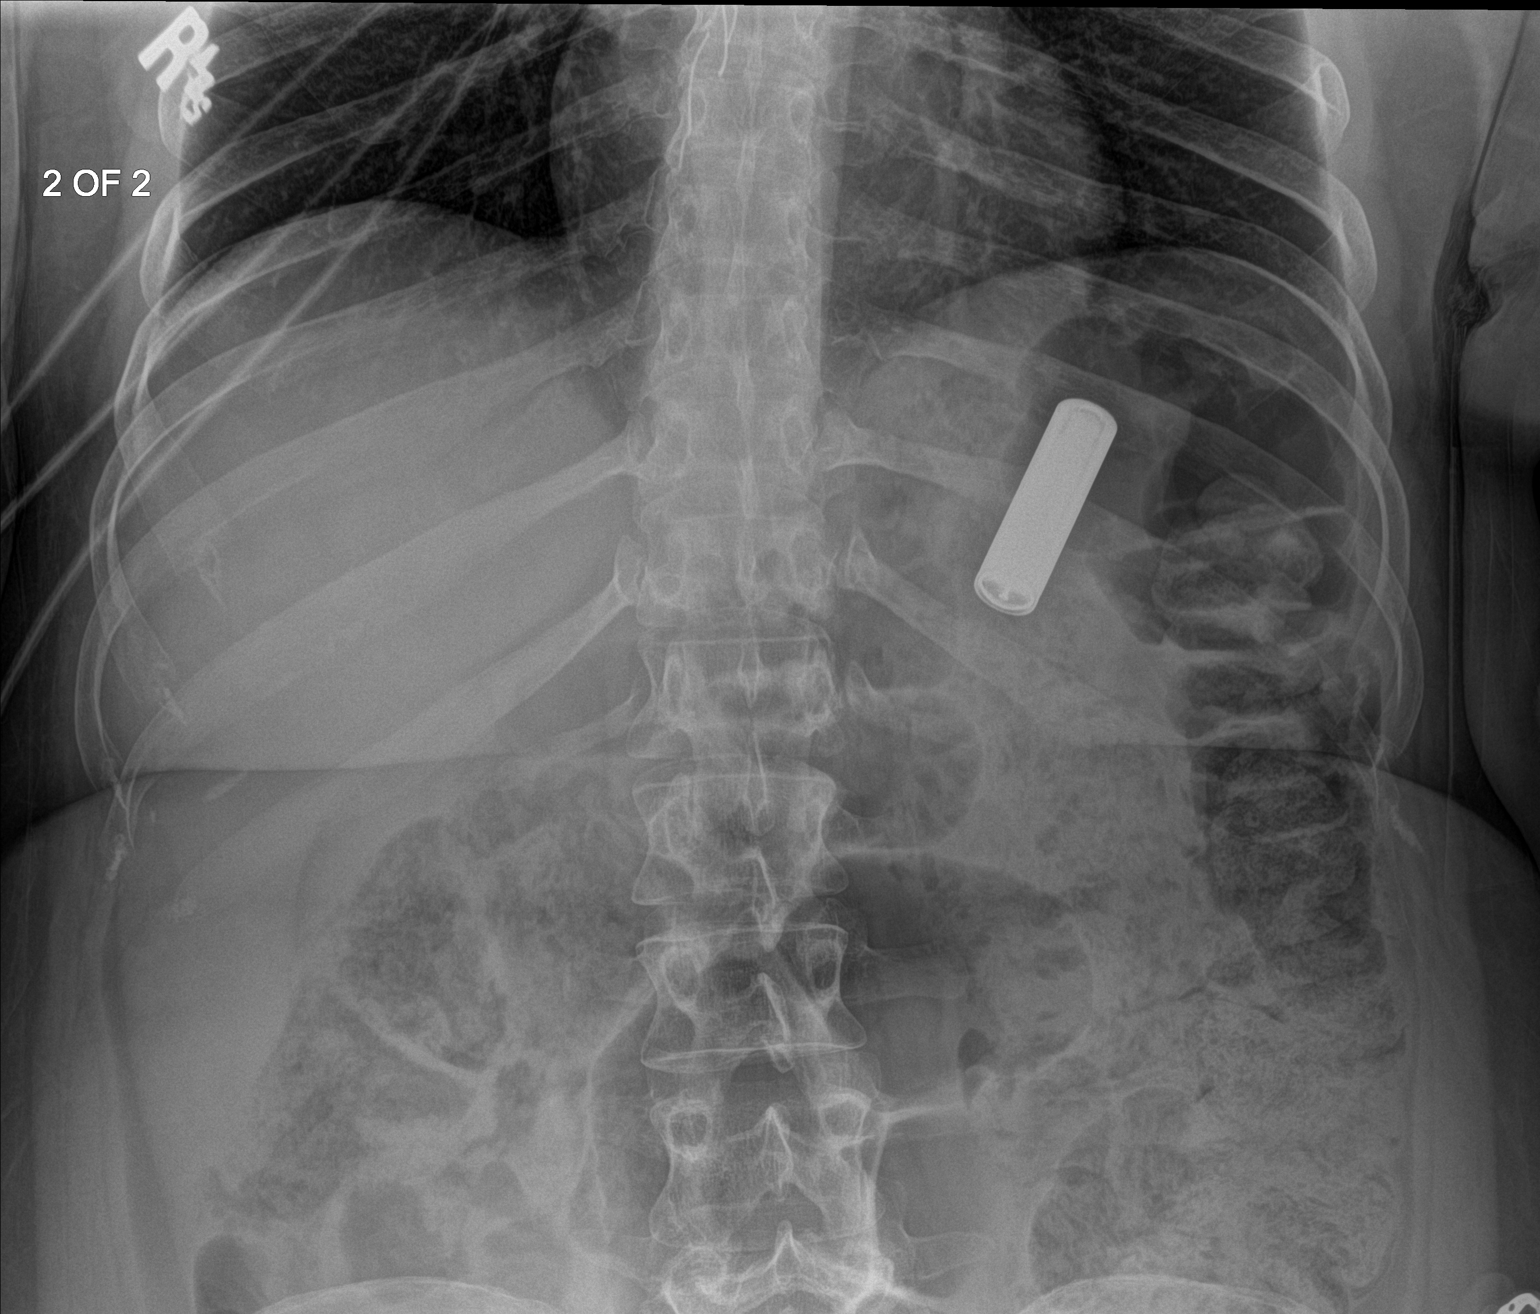

[2 of 2 positions shown; findings below may reference images not displayed]

FINDINGS: There is normal small bowel gas pattern. Abundant stool noted in
transverse colon and left colon. Moderate gas noted within redundant
sigmoid colon. A metallic battery is noted within stomach region
measures about 5.5 cm in length.
IMPRESSION: There is metallic battery in the region of the stomach. Abundant
stool noted in transverse colon and left colon. Moderate gas in
sigmoid colon. Normal small bowel gas pattern.

## 2016-02-29 SURGERY — ESOPHAGOGASTRODUODENOSCOPY (EGD) WITH PROPOFOL
Anesthesia: General

## 2016-02-29 MED ORDER — LIDOCAINE HCL (CARDIAC) 20 MG/ML IV SOLN
INTRAVENOUS | Status: DC | PRN
  Administered 2016-02-29: 80 mg via INTRAVENOUS

## 2016-02-29 MED ORDER — FENTANYL CITRATE (PF) 100 MCG/2ML IJ SOLN: 25.0000 ug | INTRAMUSCULAR | Status: DC | PRN

## 2016-02-29 MED ORDER — FENTANYL CITRATE (PF) 100 MCG/2ML IJ SOLN
INTRAMUSCULAR | Status: DC | PRN
  Administered 2016-02-29: 50 ug via INTRAVENOUS

## 2016-02-29 MED ORDER — ONDANSETRON HCL 4 MG/2ML IJ SOLN: 4.0000 mg | Freq: Once | INTRAMUSCULAR | Status: DC | PRN

## 2016-02-29 MED ORDER — PROPOFOL 10 MG/ML IV BOLUS
INTRAVENOUS | Status: DC | PRN
  Administered 2016-02-29: 180 mg via INTRAVENOUS

## 2016-02-29 MED ORDER — SUCCINYLCHOLINE CHLORIDE 20 MG/ML IJ SOLN
INTRAMUSCULAR | Status: DC | PRN
  Administered 2016-02-29: 120 mg via INTRAVENOUS

## 2016-02-29 MED ORDER — SODIUM CHLORIDE 0.9 % IV SOLN
INTRAVENOUS | Status: DC
Start: 2016-02-29 — End: 2016-02-29
  Administered 2016-02-29: 18:00:00 via INTRAVENOUS
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
## 2016-02-29 NOTE — ED Notes (Signed)
Dr.[name] at bedside  

## 2016-02-29 NOTE — ED Notes (Signed)
Pt states she came from [place] [phone] This nurse called the group home and was told that the pt could not come back to the home tonight - group home worker is going to attempt and contact supervisor to get clarification of whether pt cannot come back tonight or at all - we are to call the group home back in an hour to see if they have clearance for pt to return

## 2016-02-29 NOTE — Op Note (Signed)
[facility] Patient [name] Procedure Date: 02/29/2016 5:57 PM MRN: [name] Account #: [id number] Date of Birth: 30-Nov-1990 Admit Type: Outpatient Age: 25 Room: [facility] ENDO ROOM 2 Gender: Female Note Status: Finalized Procedure:            Upper GI endoscopy Indications:          Foreign body in the stomach Providers:            [name] MD, MD Medicines:            General Anesthesia Complications:        No immediate complications. Procedure:            Pre-Anesthesia Assessment:                       - Prior to the procedure, a History and Physical was                        performed, and patient medications and allergies were                        reviewed. The patient's tolerance of previous                        anesthesia was also reviewed. The risks and benefits of                        the procedure and the sedation options and risks were                        discussed with the patient. All questions were                        answered, and informed consent was obtained. Prior                        Anticoagulants: The patient has taken no previous                        anticoagulant or antiplatelet agents. ASA Grade                        Assessment: II - A patient with mild systemic disease.                        After reviewing the risks and benefits, the patient was                        deemed in satisfactory condition to undergo the                        procedure.                       After obtaining informed consent, the endoscope was                        passed under direct vision. Throughout the procedure,                        the patient's blood  pressure, pulse, and oxygen                        saturations were monitored continuously. The Endoscope                        was introduced through the mouth, and advanced to the                        second part of duodenum. The upper GI  endoscopy was                        accomplished without difficulty. The patient tolerated                        the procedure well. Findings:      The esophagus was normal.      A battery was found in the gastric fundus. Removal was accomplished with       a Roth net.      The examined duodenum was normal. Impression:           - Normal esophagus.                       - A battery (double A) was found in the stomach.                        Removal was successful.                       - Normal examined duodenum. Recommendation:       - Discharge patient to home.                       - Resume previous diet.                       - Continue present medications. Procedure Code(s):    --- Professional ---                       [phone], Esophagogastroduodenoscopy, flexible, transoral;                        with removal of foreign body(s) Diagnosis Code(s):    --- Professional ---                       [id number], Foreign body in stomach, initial encounter CPT copyright 2016 American Medical Association. All rights reserved. The codes documented in this report are preliminary and upon coder review may  be revised to meet current compliance requirements. [name] MD, MD 02/29/2016 6:24:48 PM This report has been signed electronically. Number of Addenda: 0 Note Initiated On: 02/29/2016 5:57 PM      [facility]
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
## 2016-02-29 NOTE — Anesthesia Procedure Notes (Signed)
Procedure Name: Intubation Date/Time: 02/29/2016 6:08 PM Performed by: Aline Brochure Pre-anesthesia Checklist: Patient identified, Emergency Drugs available, Suction available and Patient being monitored Patient Re-evaluated:Patient Re-evaluated prior to inductionOxygen Delivery Method: Circle system utilized Preoxygenation: Pre-oxygenation with 100% oxygen Intubation Type: Rapid sequence, IV induction and Cricoid Pressure applied Laryngoscope Size: Mac and 3 Grade View: Grade I Tube type: Oral Tube size: 7.0 mm Number of attempts: 1 Airway Equipment and Method: Stylet Placement Confirmation: ETT inserted through vocal cords under direct vision,  positive ETCO2 and breath sounds checked- equal and bilateral Secured at: 20 cm Tube secured with: Tape Dental Injury: Teeth and Oropharynx as per pre-operative assessment

## 2016-02-29 NOTE — Transfer of Care (Signed)
Immediate Anesthesia Transfer of Care Note  Patient: Melanie Bell  Procedure(s) Performed: Procedure(s): ESOPHAGOGASTRODUODENOSCOPY (EGD) WITH PROPOFOL (N/A)  Patient Location: PACU  Anesthesia Type:General  Level of Consciousness: awake  Airway & Oxygen Therapy: Patient connected to face mask oxygen  Post-op Assessment: Post -op Vital signs reviewed and stable  Post vital signs: stable  Last Vitals:  Vitals:   02/29/16 1826 02/29/16 1827  BP: 132/86 132/86  Pulse: 89 89  Resp: (!) 24 (!) 24  Temp: 36.7 C     Last Pain:  Vitals:   02/29/16 1743  TempSrc: Tympanic  PainSc: 5          Complications: No apparent anesthesia complications

## 2016-02-29 NOTE — ED Triage Notes (Signed)
Pt presents after ingesting AA battery at 3pm. Pt states that she heard voices that told her to do it. Pt reports that she didn't get all her medications, and that leads to her hearing voices. Pt states she usually gets 4 pills but only got 3 today.Pt came with coy of MAR which shows medications given. Pt reports demonic voices that tell her to ingest batteries because she is no good. Pt did this previously on 10/8 and had to be taken to [facility] for endoscopy. Pt states she told her group home about the battery when she began to experience abdominal pain. Pt alert & oriented.

## 2016-02-29 NOTE — ED Notes (Signed)
Report given to [facility] of Endoscopy. Pt to be transported.

## 2016-02-29 NOTE — ED Provider Notes (Signed)
[facility] Emergency Department Provider Note  ____________________________________________   I have reviewed the triage vital signs and the nursing notes.   HISTORY  Chief Complaint Ingestion   History limited by: Not Limited   HPI Melanie Bell is a 25 y.o. female who presents to the emergency department today because of concern for acute ingestion of a AA battery. Patient has a history of schizophrenia and multiple ed admissions for foreign body ingestion. Last seen 8 days ago for similar complaint. Today the patient states that she heard voices that told her to ingest the battery. This occurred roughly 2 hours prior to my evaluation. Since then she has been complaining of some GI upset. No vomiting.    Past Medical History:  Diagnosis Date  . Anxiety   . Asthma   . Depression   . GERD (gastroesophageal reflux disease)   . Hallucinations 09/30/2014   Sizoaffective  . Hyperlipidemia   . Hypertension   . Tardive dyskinesia 10/2014   recent onset    Patient Active Problem List   Diagnosis Date Noted  . Foreign body ingestion, initial encounter 07/31/2015  . Schizoaffective disorder, bipolar type ([place]) 11/06/2014  . Tobacco use disorder 09/30/2014  . Hypothyroidism 09/29/2014  . Hypertension 09/29/2014  . Constipation 09/29/2014    Past Surgical History:  Procedure Laterality Date  . ABDOMINAL SURGERY     "years ago" to remove foreign objects  . APPENDECTOMY    . BREAST LUMPECTOMY Right   . COLONOSCOPY WITH PROPOFOL N/A 09/10/2015   Procedure: COLONOSCOPY WITH PROPOFOL;  Surgeon: [name], MD;  Location: [facility] ENDOSCOPY;  Service: Endoscopy;  Laterality: N/A;  . ESOPHAGOGASTRODUODENOSCOPY N/A 11/28/2014   Procedure: ESOPHAGOGASTRODUODENOSCOPY (EGD);  Surgeon: [name], MD;  Location: [facility] ENDOSCOPY;  Service: Endoscopy;  Laterality: N/A;  . ESOPHAGOGASTRODUODENOSCOPY N/A 02/21/2016   Procedure: ESOPHAGOGASTRODUODENOSCOPY  (EGD);  Surgeon: Mauri Pole, MD;  Location: [facility] ENDOSCOPY;  Service: Endoscopy;  Laterality: N/A;  . LAPAROTOMY N/A 09/12/2015   Procedure: EXPLORATORY LAPAROTOMY;  Surgeon: Florene Glen, MD;  Location: [facility] ORS;  Service: General;  Laterality: N/A;  . SIGMOIDOSCOPY N/A 09/12/2015   Procedure: Lonell Face;  Surgeon: Florene Glen, MD;  Location: [facility] ORS;  Service: General;  Laterality: N/A;  . WISDOM TOOTH EXTRACTION      Prior to Admission medications   Medication Sig Start Date End Date Taking? Authorizing Provider  amLODipine (NORVASC) 2.5 MG tablet Take 1 tablet (2.5 mg total) by mouth daily. 06/19/15   [name], MD  benztropine (COGENTIN) 1 MG tablet Take 1 mg by mouth at bedtime.    Historical Provider, MD  fluPHENAZine (PROLIXIN) 5 MG tablet Take 5-10 mg by mouth See admin instructions. Take 5 mg by mouth in the morning, take 5 mg by mouth in the afternoon and take 10 mg by mouth at bedtime.    Historical Provider, MD  hydrOXYzine (VISTARIL) 50 MG capsule Take 50 mg by mouth 2 (two) times daily as needed for anxiety.    Historical Provider, MD  levothyroxine (SYNTHROID, LEVOTHROID) 75 MCG tablet Take 1 tablet (75 mcg total) by mouth daily before breakfast. 06/19/15   [name] [name], MD  Lurasidone HCl (LATUDA) 120 MG TABS Take 120 mg by mouth every evening.    Historical Provider, MD  OXcarbazepine (TRILEPTAL) 150 MG tablet Take 1 tablet (150 mg total) by mouth 2 (two) times daily. 06/19/15   [name], MD  REGLAN 5 MG tablet Take  1 tablet (5 mg total) by mouth 3 (three) times daily. 12/13/15 12/14/15  Lavonia Drafts, MD  traZODone (DESYREL) 50 MG tablet Take 50 mg by mouth at bedtime.    Historical Provider, MD  venlafaxine XR (EFFEXOR-XR) 150 MG 24 hr capsule Take 1 capsule (150 mg total) by mouth daily with breakfast. 06/19/15   [name], MD    Allergies Betadine [povidone iodine]; Iodine; and Shellfish-derived products  Family History   Problem Relation Age of Onset  . Depression Mother   . Hypertension Mother     Social History Social History  Substance Use Topics  . Smoking status: Current Every Day Smoker    Packs/day: 0.50    Years: 3.00    Types: Cigarettes  . Smokeless tobacco: Never Used  . Alcohol use No    Review of Systems  Constitutional: Negative for fever. Cardiovascular: Negative for chest pain. Respiratory: Negative for shortness of breath. Gastrointestinal: Positive for abdominal discomfort. Genitourinary: Negative for dysuria. Musculoskeletal: Negative for back pain. Skin: Negative for rash. Neurological: Negative for headaches, focal weakness or numbness. Psychiatric: Patient states she is hearing voices.   10-point ROS otherwise negative.  ____________________________________________   PHYSICAL EXAM:  VITAL SIGNS: ED Triage Vitals [02/29/16 1613]  Enc Vitals Group     BP 114/74     Pulse Rate 77     Resp 14     Temp 98.7 F (37.1 C)     Temp Source Oral     SpO2 98 %     Weight 200 lb (90.7 kg)     Height 5\' 6"  (1.676 m)     Head Circumference      Peak Flow      Pain Score 6   Constitutional: Alert and oriented. Well appearing and in no distress. Eyes: Conjunctivae are normal. Normal extraocular movements. ENT   Head: Normocephalic and atraumatic.   Nose: No congestion/rhinnorhea.   Mouth/Throat: Mucous membranes are moist.   Neck: No stridor. Hematological/Lymphatic/Immunilogical: No cervical lymphadenopathy. Cardiovascular: Normal rate, regular rhythm.  No murmurs, rubs, or gallops. Respiratory: Normal respiratory effort without tachypnea nor retractions. Breath sounds are clear and equal bilaterally. No wheezes/rales/rhonchi. Gastrointestinal: Soft and nontender. No distention.  Genitourinary: Deferred Musculoskeletal: Normal range of motion in all extremities. No lower extremity edema. Neurologic:  Normal speech and language. No gross focal  neurologic deficits are appreciated.  Skin:  Skin is warm, dry and intact. No rash noted. Psychiatric: Endorses auditory hallucinations but was not noted to be responding to any internal stimuli. Endorses occasional thoughts of wanting to hurt herself.  ____________________________________________    LABS (pertinent positives/negatives)  Labs Reviewed  POCT PREGNANCY, URINE     ____________________________________________   EKG  None  ____________________________________________    RADIOLOGY  DG abdomen IMPRESSION:  There is metallic battery in the region of the stomach. Abundant  stool noted in transverse colon and left colon. Moderate gas in  sigmoid colon. Normal small bowel gas pattern.   I, Lumir Demetriou, personally viewed and evaluated these images (plain radiographs) as part of my medical decision making. ____________________________________________   PROCEDURES  Procedures  ____________________________________________   INITIAL IMPRESSION / ASSESSMENT AND PLAN / ED COURSE  Pertinent labs & imaging results that were available during my care of the patient were reviewed by me and considered in my medical decision making (see chart for details).  Patient here after swallowing battery because auditory hallucinations told her too. Patient was taken to endoscopy suite and had  EGD to remove the foreign body. Will await psychiatric evaluation. ____________________________________________   FINAL CLINICAL IMPRESSION(S) / ED DIAGNOSES  Final diagnoses:  Ingestion of foreign body, initial encounter     Note: This dictation was prepared with Dragon dictation. Any transcriptional errors that result from this process are unintentional    Nance Pear, MD 03/01/16 1409

## 2016-02-29 NOTE — ED Notes (Signed)
Pt returned to the ER after endoscope to remove battery that she swallowed today - Battery was removed by Dr Verl Blalock without difficulty and pt was stable and transferred back to the ER - pt has no difficulty swallowing at this time and is alert and oriented - respirations even and unlabored - Dr Quentin Cornwall notified of pt return and awaiting his orders to discharge or have psych consult

## 2016-02-29 NOTE — ED Provider Notes (Signed)
Melanie Bell received in signout from Dr. Archie Balboa. Patient has been observed in the ER for several hours after intentional ingestion of AA battery with secondary gain to being removed from group home. Patient wanted to service with recurrent episodes. Patient has been evaluated by psychiatry at bedside and not felt to be demonstrating any actual suicidal ideation or intent for self-harm. Patient clinically stable for discharge back to group home.  Have discussed with the patient and available family all diagnostics and treatments performed thus far and all questions were answered to the best of my ability. The patient demonstrates understanding and agreement with plan.    Melanie Lot, MD 02/29/16 [phone]

## 2016-02-29 NOTE — Anesthesia Preprocedure Evaluation (Signed)
Anesthesia Evaluation  Patient identified by MRN, date of birth, ID band Patient awake    Reviewed: Allergy & Precautions, H&P , NPO status , Patient's Chart, lab work & pertinent test results, reviewed documented beta blocker date and time   Airway Mallampati: II  TM Distance: >3 FB Neck ROM: full    Dental  (+) Teeth Intact   Pulmonary neg pulmonary ROS, asthma , Current Smoker,    Pulmonary exam normal        Cardiovascular hypertension, negative cardio ROS Normal cardiovascular exam Rhythm:regular Rate:Normal     Neuro/Psych negative neurological ROS  negative psych ROS   GI/Hepatic negative GI ROS, Neg liver ROS, GERD  Medicated,  Endo/Other  negative endocrine ROSHypothyroidism   Renal/GU negative Renal ROS  negative genitourinary   Musculoskeletal   Abdominal   Peds  Hematology negative hematology ROS (+)   Anesthesia Other Findings Past Medical History: No date: Anxiety No date: Asthma No date: Depression No date: GERD (gastroesophageal reflux disease) 09/30/2014: Hallucinations     Comment: Sizoaffective No date: Hyperlipidemia No date: Hypertension 10/2014: Tardive dyskinesia     Comment: recent onset Past Surgical History: No date: ABDOMINAL SURGERY     Comment: "years ago" to remove foreign objects No date: APPENDECTOMY No date: BREAST LUMPECTOMY Right 09/10/2015: COLONOSCOPY WITH PROPOFOL N/A     Comment: Procedure: COLONOSCOPY WITH PROPOFOL;                Surgeon: [name], MD;  Location: [facility]              ENDOSCOPY;  Service: Endoscopy;  Laterality:               N/A; 11/28/2014: ESOPHAGOGASTRODUODENOSCOPY N/A     Comment: Procedure: ESOPHAGOGASTRODUODENOSCOPY (EGD);                Surgeon: [name], MD;  Location: [facility]               ENDOSCOPY;  Service: Endoscopy;  Laterality:               N/A; 02/21/2016: ESOPHAGOGASTRODUODENOSCOPY N/A     Comment: Procedure:  ESOPHAGOGASTRODUODENOSCOPY (EGD);                Surgeon: Mauri Pole, MD;  Location: [facility]               ENDOSCOPY;  Service: Endoscopy;  Laterality:               N/A; 09/12/2015: LAPAROTOMY N/A     Comment: Procedure: EXPLORATORY LAPAROTOMY;  Surgeon:               Florene Glen, MD;  Location: [facility] ORS;                Service: General;  Laterality: N/A; 09/12/2015: SIGMOIDOSCOPY N/A     Comment: Procedure: SIGMOIDOSCOPY;  Surgeon: Florene Glen, MD;  Location: [facility] ORS;  Service:               General;  Laterality: N/A; No date: WISDOM TOOTH EXTRACTION BMI    Body Mass Index:  32.28 kg/m     Reproductive/Obstetrics negative OB ROS                             Anesthesia Physical Anesthesia Plan  ASA: II and  emergent  Anesthesia Plan: General ETT   Post-op Pain Management:    Induction:   Airway Management Planned:   Additional Equipment:   Intra-op Plan:   Post-operative Plan:   Informed Consent: I have reviewed the patients History and Physical, chart, labs and discussed the procedure including the risks, benefits and alternatives for the proposed anesthesia with the patient or authorized representative who has indicated his/her understanding and acceptance.   Dental Advisory Given  Plan Discussed with: CRNA  Anesthesia Plan Comments:         Anesthesia Quick Evaluation

## 2016-03-01 ENCOUNTER — Emergency Department
Admission: EM | Admit: 2016-03-01 | Discharge: 2016-03-03 | Disposition: A | Discharge status: Other Acute Inpatient Hospital | Payer: Medicaid Other | Attending: Emergency Medicine | Admitting: Emergency Medicine

## 2016-03-01 ENCOUNTER — Encounter: Payer: Self-pay | Admitting: Emergency Medicine

## 2016-03-01 ENCOUNTER — Emergency Department
Admission: EM | Admit: 2016-03-01 | Discharge: 2016-03-01 | Disposition: A | Discharge status: Other Acute Inpatient Hospital | Payer: Medicaid Other | Source: Home / Self Care | Attending: Emergency Medicine | Admitting: Emergency Medicine

## 2016-03-01 ENCOUNTER — Emergency Department: Payer: Medicaid Other

## 2016-03-01 ENCOUNTER — Other Ambulatory Visit: Payer: Self-pay

## 2016-03-01 DIAGNOSIS — E039 Hypothyroidism, unspecified: Secondary | ICD-10-CM

## 2016-03-01 DIAGNOSIS — Z79899 Other long term (current) drug therapy: Secondary | ICD-10-CM | POA: Insufficient documentation

## 2016-03-01 DIAGNOSIS — J45909 Unspecified asthma, uncomplicated: Secondary | ICD-10-CM | POA: Insufficient documentation

## 2016-03-01 DIAGNOSIS — F250 Schizoaffective disorder, bipolar type: Secondary | ICD-10-CM

## 2016-03-01 DIAGNOSIS — Y939 Activity, unspecified: Secondary | ICD-10-CM | POA: Diagnosis not present

## 2016-03-01 DIAGNOSIS — R45851 Suicidal ideations: Secondary | ICD-10-CM | POA: Diagnosis present

## 2016-03-01 DIAGNOSIS — Y999 Unspecified external cause status: Secondary | ICD-10-CM | POA: Diagnosis not present

## 2016-03-01 DIAGNOSIS — F17210 Nicotine dependence, cigarettes, uncomplicated: Secondary | ICD-10-CM | POA: Insufficient documentation

## 2016-03-01 DIAGNOSIS — X58XXXA Exposure to other specified factors, initial encounter: Secondary | ICD-10-CM | POA: Insufficient documentation

## 2016-03-01 DIAGNOSIS — I10 Essential (primary) hypertension: Secondary | ICD-10-CM

## 2016-03-01 DIAGNOSIS — F329 Major depressive disorder, single episode, unspecified: Secondary | ICD-10-CM

## 2016-03-01 DIAGNOSIS — Y929 Unspecified place or not applicable: Secondary | ICD-10-CM | POA: Diagnosis not present

## 2016-03-01 DIAGNOSIS — T189XXA Foreign body of alimentary tract, part unspecified, initial encounter: Secondary | ICD-10-CM

## 2016-03-01 LAB — CBC WITH DIFFERENTIAL/PLATELET
Basophils Absolute: 0.1 10*3/uL (ref 0–0.1)
Basophils Relative: 1 %
EOS ABS: 0.1 10*3/uL (ref 0–0.7)
Eosinophils Relative: 1 %
HCT: 38.9 % (ref 35.0–47.0)
HEMOGLOBIN: 13.3 g/dL (ref 12.0–16.0)
LYMPHS ABS: 2 10*3/uL (ref 1.0–3.6)
LYMPHS PCT: 28 %
MCH: 30.4 pg (ref 26.0–34.0)
MCHC: 34.3 g/dL (ref 32.0–36.0)
MCV: 88.8 fL (ref 80.0–100.0)
MONOS PCT: 5 %
Monocytes Absolute: 0.4 10*3/uL (ref 0.2–0.9)
NEUTROS PCT: 65 %
Neutro Abs: 4.6 10*3/uL (ref 1.4–6.5)
Platelets: 260 10*3/uL (ref 150–440)
RBC: 4.38 MIL/uL (ref 3.80–5.20)
RDW: 13.1 % (ref 11.5–14.5)
WBC: 7.1 10*3/uL (ref 3.6–11.0)

## 2016-03-01 LAB — COMPREHENSIVE METABOLIC PANEL
ALBUMIN: 4.4 g/dL (ref 3.5–5.0)
ALK PHOS: 80 U/L (ref 38–126)
ALT: 8 U/L — AB (ref 14–54)
ANION GAP: 9 (ref 5–15)
AST: 17 U/L (ref 15–41)
BUN: 11 mg/dL (ref 6–20)
CALCIUM: 9.9 mg/dL (ref 8.9–10.3)
CHLORIDE: 106 mmol/L (ref 101–111)
CO2: 23 mmol/L (ref 22–32)
CREATININE: 0.77 mg/dL (ref 0.44–1.00)
GFR calc Af Amer: >60 mL/min (ref >60)
GFR calc non Af Amer: >60 mL/min (ref >60)
GLUCOSE: 88 mg/dL (ref 65–99)
Potassium: 4.2 mmol/L (ref 3.5–5.1)
SODIUM: 138 mmol/L (ref 135–145)
Total Bilirubin: 0.3 mg/dL (ref 0.3–1.2)
Total Protein: 8.1 g/dL (ref 6.5–8.1)

## 2016-03-01 LAB — URINALYSIS COMPLETE WITH MICROSCOPIC (ARMC ONLY)
BACTERIA UA: NONE SEEN
Bilirubin Urine: NEGATIVE
GLUCOSE, UA: NEGATIVE mg/dL
HGB URINE DIPSTICK: NEGATIVE
Ketones, ur: NEGATIVE mg/dL
NITRITE: NEGATIVE
PH: 5 (ref 5.0–8.0)
PROTEIN: 30 mg/dL — AB
RBC / HPF: NONE SEEN RBC/hpf (ref 0–5)
SPECIFIC GRAVITY, URINE: 1.025 (ref 1.005–1.030)

## 2016-03-01 LAB — URINE DRUG SCREEN, QUALITATIVE (ARMC ONLY)
Amphetamines, Ur Screen: NOT DETECTED
BARBITURATES, UR SCREEN: NOT DETECTED
BENZODIAZEPINE, UR SCRN: NOT DETECTED
CANNABINOID 50 NG, UR ~~LOC~~: NOT DETECTED
Cocaine Metabolite,Ur ~~LOC~~: NOT DETECTED
MDMA (Ecstasy)Ur Screen: NOT DETECTED
Methadone Scn, Ur: NOT DETECTED
Opiate, Ur Screen: NOT DETECTED
PHENCYCLIDINE (PCP) UR S: NOT DETECTED
TRICYCLIC, UR SCREEN: NOT DETECTED

## 2016-03-01 LAB — POC URINE PREG, ED: PREG TEST UR: NEGATIVE

## 2016-03-01 LAB — ACETAMINOPHEN LEVEL: Acetaminophen (Tylenol), Serum: <10 ug/mL — ABNORMAL LOW (ref 10–30)

## 2016-03-01 LAB — SALICYLATE LEVEL

## 2016-03-01 LAB — ETHANOL: Alcohol, Ethyl (B): 45 mg/dL — ABNORMAL HIGH (ref <5)

## 2016-03-01 IMAGING — CR DG ABDOMEN ACUTE W/ 1V CHEST
3 series · 3 of 3 positions shown · non-contrast
Comparison: [DATE]

CLINICAL DATA: Swallowed a thumb tack.

EXAM:
DG ABDOMEN ACUTE W/ 1V CHEST

[chest pa]
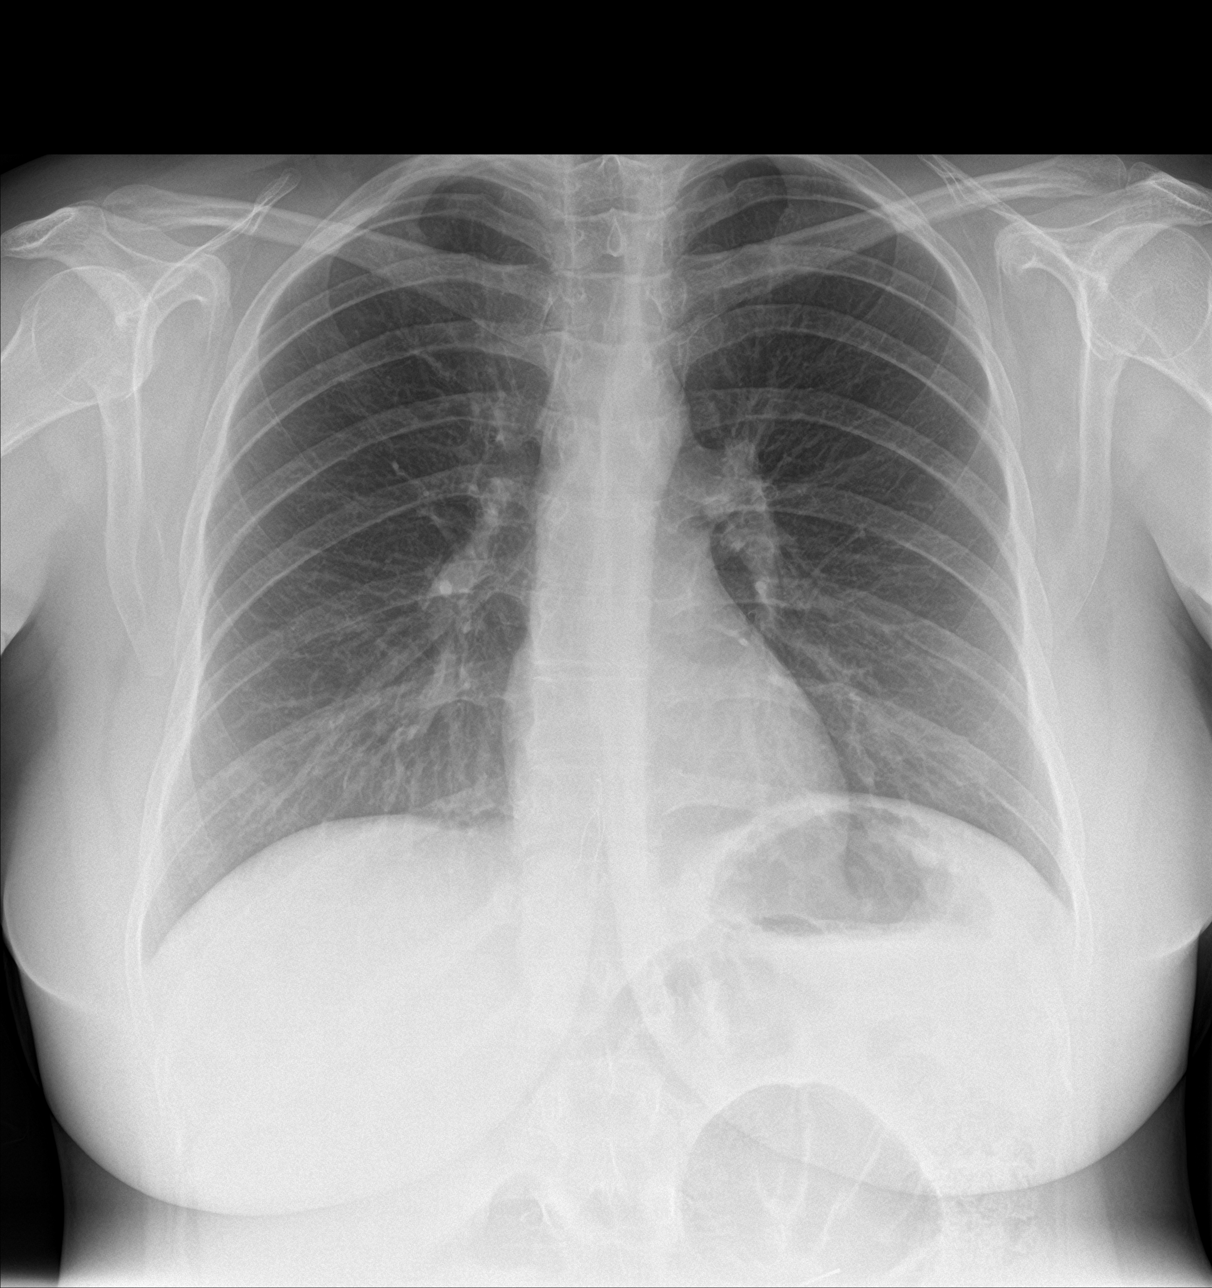

[abdomen erect]
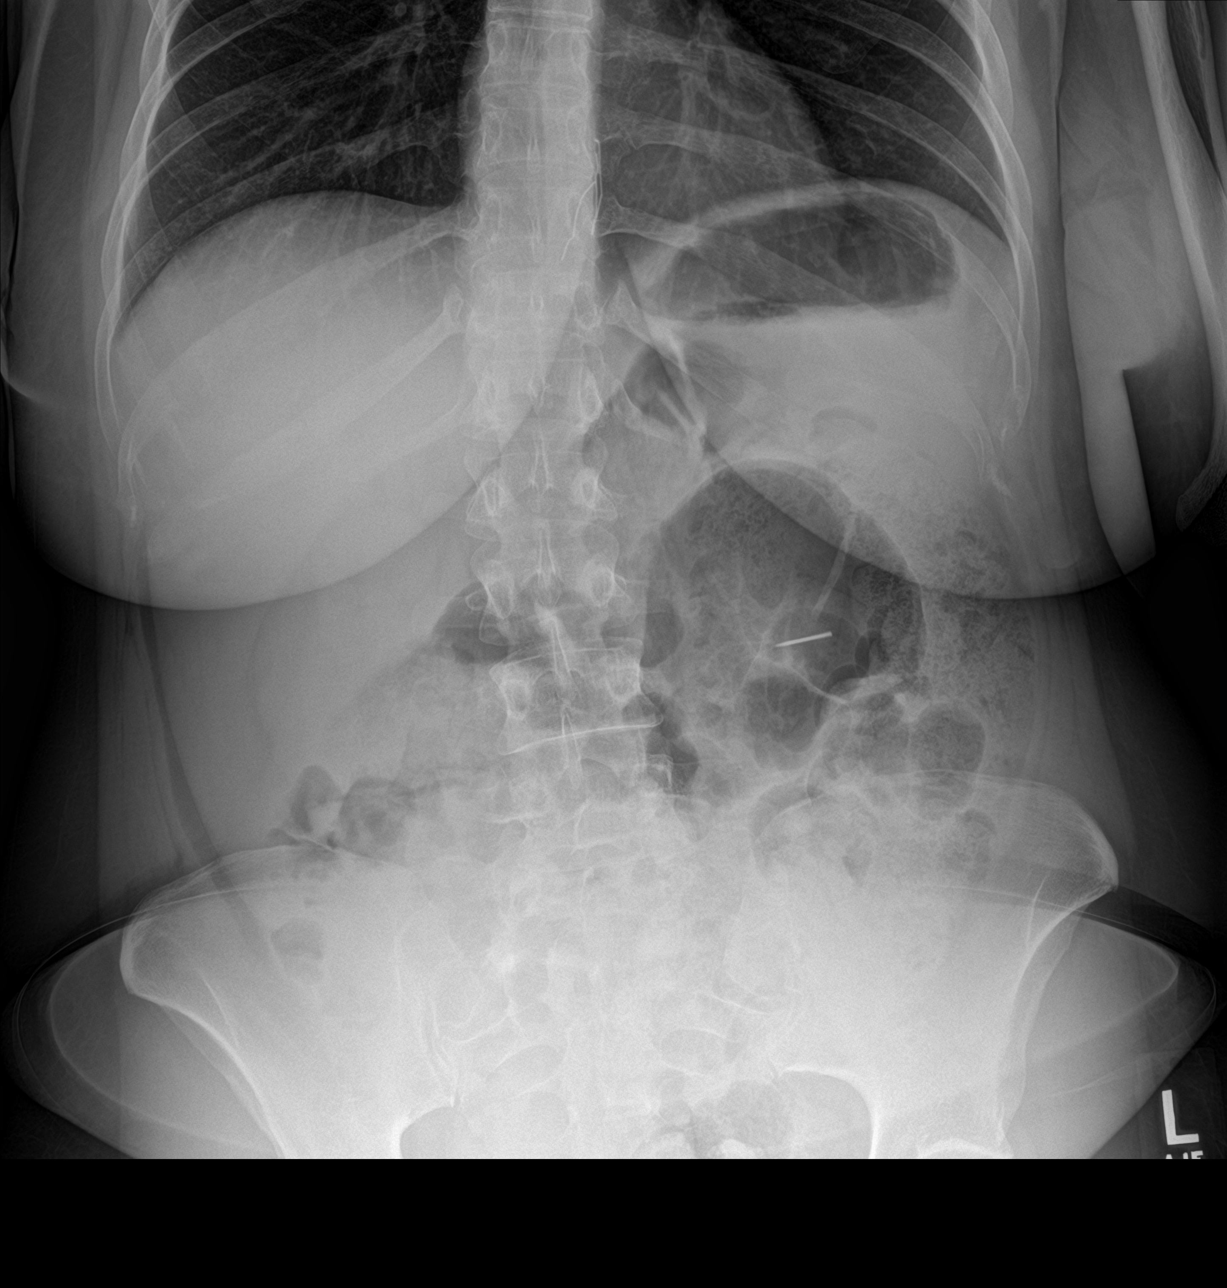

[abdomen supine]
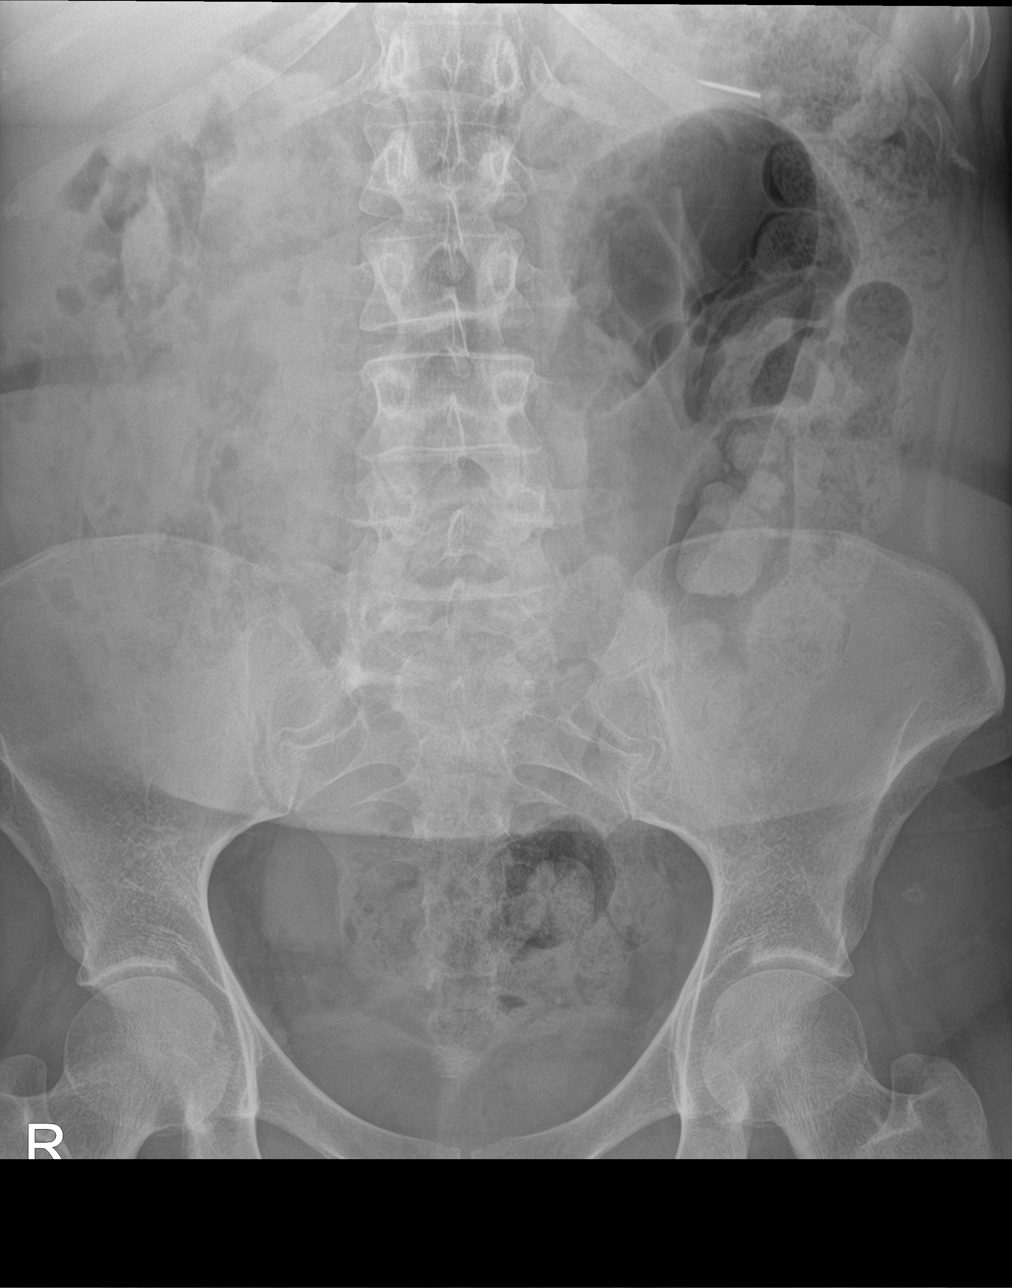

[3 of 3 positions shown; findings below may reference images not displayed]

FINDINGS: Battery is no longer seen but now there is a pointed metallic
foreign body over the left abdomen correlating with the history. The
metallic portion measures up to 17 mm in length. Chronic linear
foreign bodies over the lower mediastinum which are presumably
subcutaneous based on [DATE] abdominal CT. Abundant formed
stool. No evidence of pneumoperitoneum.

There is no edema, consolidation, effusion, or pneumothorax. Normal
heart size and mediastinal contours. No evidence of
pneumomediastinum.
IMPRESSION: 1. Confirmed tack over the left abdomen which overlaps colon, small
bowel, and stomach. No evidence of pneumoperitoneum or
pneumomediastinum.
2. Chronic foreign bodies over the lower chest.

## 2016-03-01 NOTE — ED Notes (Signed)
ED BHU PLACEMENT JUSTIFICATION Is the patient under IVC or is there intent for IVC: Yes.   Is the patient medically cleared: Yes.   Is there vacancy in the ED BHU: Yes.   Is the population mix appropriate for patient: Yes.   Is the patient awaiting placement in inpatient or outpatient setting: Yes.   Has the patient had a psychiatric consult: Yes.   Survey of unit performed for contraband, proper placement and condition of furniture, tampering with fixtures in bathroom, shower, and each patient room: Yes.  ; Findings: NA APPEARANCE/BEHAVIOR calm, cooperative and adequate rapport can be established NEURO ASSESSMENT Orientation: time, place and person Hallucinations: Yes.  Auditory Hallucinations Speech: Normal Gait: normal RESPIRATORY ASSESSMENT Normal expansion.  Clear to auscultation.  No rales, rhonchi, or wheezing. CARDIOVASCULAR ASSESSMENT regular rate and rhythm, S1, S2 normal, no murmur, click, rub or gallop GASTROINTESTINAL ASSESSMENT soft, nontender, BS WNL, no r/g EXTREMITIES normal strength, tone, and muscle mass PLAN OF CARE Provide calm/safe environment. Vital signs assessed twice daily. ED BHU Assessment once each 12-hour shift. Collaborate with intake RN daily or as condition indicates. Assure the ED provider has rounded once each shift. Provide and encourage hygiene. Provide redirection as needed. Assess for escalating behavior; address immediately and inform ED provider.  Assess family dynamic and appropriateness for visitation as needed: Yes.  ; If necessary, describe findings: NA Educate the patient/family about BHU procedures/visitation: Yes.  ; If necessary, describe findings: NA  

## 2016-03-01 NOTE — ED Notes (Addendum)
Spoke with poison control. They were informed plan is to let patient pass thumbtack and GI was not notified. Poison control will call back once investigating face wash astringent that patient swallowed.

## 2016-03-01 NOTE — H&P (View-Only) (Signed)
[facility] History and Physical   Primary Care Physician:  Casilda Carls, MD   Reason for Procedure:  Foreign body ingestion (2 AA batteries and push pin) Plan:    EGD with foreign body removal     HPI: Melanie Bell is a 25 y.o. female presented to [facility] ER after ingesting AA batteries and push pin with suicidal ideation. Denies any nausea, vomiting, abdominal pain, melena or bright red blood per rectum    Past Medical History:  Diagnosis Date  . Anxiety   . Asthma   . Depression   . GERD (gastroesophageal reflux disease)   . Hallucinations 09/30/2014   Sizoaffective  . Hyperlipidemia   . Hypertension   . Tardive dyskinesia 10/2014   recent onset    Past Surgical History:  Procedure Laterality Date  . ABDOMINAL SURGERY     "years ago" to remove foreign objects  . APPENDECTOMY    . BREAST LUMPECTOMY Right   . COLONOSCOPY WITH PROPOFOL N/A 09/10/2015   Procedure: COLONOSCOPY WITH PROPOFOL;  Surgeon: [name], MD;  Location[facility] ENDOSCOPY;  Service: Endoscopy;  Laterality: N/A;  . ESOPHAGOGASTRODUODENOSCOPY N/A 11/28/2014   Procedure: ESOPHAGOGASTRODUODENOSCOPY (EGD);  Surgeon: [name], MD;  Location: [facility] ENDOSCOPY;  Service: Endoscopy;  Laterality: N/A;  . LAPAROTOMY N/A 09/12/2015   Procedure: EXPLORATORY LAPAROTOMY;  Surgeon: Florene Glen, MD;  Location: [facility] ORS;  Service: General;  Laterality: N/A;  . SIGMOIDOSCOPY N/A 09/12/2015   Procedure: Lonell Face;  Surgeon: Florene Glen, MD;  Location: [facility] ORS;  Service: General;  Laterality: N/A;  . WISDOM TOOTH EXTRACTION      Prior to Admission medications   Medication Sig Start Date End Date Taking? Authorizing Provider  amLODipine (NORVASC) 2.5 MG tablet Take 1 tablet (2.5 mg total) by mouth daily. 06/19/15  Yes [name] [name], MD  benztropine (COGENTIN) 1 MG tablet Take 1 mg by mouth at bedtime.   Yes Historical Provider, MD  fluPHENAZine (PROLIXIN) 5 MG tablet Take 5-10  mg by mouth See admin instructions. Take 5 mg by mouth in the morning, take 5 mg by mouth in the afternoon and take 10 mg by mouth at bedtime.   Yes Historical Provider, MD  hydrOXYzine (VISTARIL) 50 MG capsule Take 50 mg by mouth 2 (two) times daily as needed for anxiety.   Yes Historical Provider, MD  levothyroxine (SYNTHROID, LEVOTHROID) 75 MCG tablet Take 1 tablet (75 mcg total) by mouth daily before breakfast. 06/19/15  Yes [name] [name], MD  Lurasidone HCl (LATUDA) 120 MG TABS Take 120 mg by mouth every evening.   Yes Historical Provider, MD  OXcarbazepine (TRILEPTAL) 150 MG tablet Take 1 tablet (150 mg total) by mouth 2 (two) times daily. 06/19/15  Yes [name] [name], MD  sulfamethoxazole-trimethoprim (BACTRIM DS,SEPTRA DS) 800-160 MG tablet Take 1 tablet by mouth 2 (two) times daily. 02/16/16 02/26/16 Yes Historical Provider, MD  traZODone (DESYREL) 50 MG tablet Take 50 mg by mouth at bedtime.   Yes Historical Provider, MD  venlafaxine XR (EFFEXOR-XR) 150 MG 24 hr capsule Take 1 capsule (150 mg total) by mouth daily with breakfast. 06/19/15  Yes [name] [name], MD  REGLAN 5 MG tablet Take 1 tablet (5 mg total) by mouth 3 (three) times daily. 12/13/15 12/14/15  Lavonia Drafts, MD    Current Facility-Administered Medications  Medication Dose Route Frequency Provider Last Rate Last Dose  . 0.9 %  sodium chloride infusion   Intravenous Continuous Kavitha V Nandigam,  MD 20 mL/hr at 02/21/16 1446     Current Outpatient Prescriptions  Medication Sig Dispense Refill  . amLODipine (NORVASC) 2.5 MG tablet Take 1 tablet (2.5 mg total) by mouth daily. 30 tablet 0  . benztropine (COGENTIN) 1 MG tablet Take 1 mg by mouth at bedtime.    . fluPHENAZine (PROLIXIN) 5 MG tablet Take 5-10 mg by mouth See admin instructions. Take 5 mg by mouth in the morning, take 5 mg by mouth in the afternoon and take 10 mg by mouth at bedtime.    . hydrOXYzine (VISTARIL) 50 MG capsule Take 50 mg by mouth 2 (two)  times daily as needed for anxiety.    Marland Kitchen levothyroxine (SYNTHROID, LEVOTHROID) 75 MCG tablet Take 1 tablet (75 mcg total) by mouth daily before breakfast. 30 tablet 0  . Lurasidone HCl (LATUDA) 120 MG TABS Take 120 mg by mouth every evening.    . OXcarbazepine (TRILEPTAL) 150 MG tablet Take 1 tablet (150 mg total) by mouth 2 (two) times daily. 60 tablet 0  . sulfamethoxazole-trimethoprim (BACTRIM DS,SEPTRA DS) 800-160 MG tablet Take 1 tablet by mouth 2 (two) times daily.    . traZODone (DESYREL) 50 MG tablet Take 50 mg by mouth at bedtime.    Marland Kitchen venlafaxine XR (EFFEXOR-XR) 150 MG 24 hr capsule Take 1 capsule (150 mg total) by mouth daily with breakfast. 30 capsule 0  . REGLAN 5 MG tablet Take 1 tablet (5 mg total) by mouth 3 (three) times daily. 3 tablet 0    Allergies as of 02/21/2016 - Review Complete 02/21/2016  Allergen Reaction Noted  . Betadine [povidone iodine] Other (See Comments) 09/29/2014  . Iodine Rash 09/29/2014  . Shellfish-derived products Other (See Comments) 11/05/2014    Family History  Problem Relation Age of Onset  . Depression Mother   . Hypertension Mother     Social History   Social History  . Marital status: Single    Spouse name: N/A  . Number of children: N/A  . Years of education: N/A   Occupational History  . Not on file.   Social History Main Topics  . Smoking status: Current Every Day Smoker    Packs/day: 0.50    Years: 3.00    Types: Cigarettes  . Smokeless tobacco: Never Used  . Alcohol use No  . Drug use: No  . Sexual activity: No   Other Topics Concern  . Not on file   Social History Narrative  . No narrative on file    Review of Systems:  All other review of systems negative except as mentioned in the HPI.  Physical Exam: Vital signs in last 24 hours: Temp:  [98.3 F (36.8 C)] 98.3 F (36.8 C) (10/08 1227) Pulse Rate:  [64-80] 64 (10/08 1445) Resp:  [12-14] 12 (10/08 1445) BP: (104-123)/(58-77) 123/69 (10/08 1445) SpO2:   [99 %-100 %] 100 % (10/08 1445)   General:   Alert,  Well-developed, well-nourished, pleasant and cooperative in NAD Lungs:  Clear throughout to auscultation.   Heart:  Regular rate and rhythm; no murmurs, clicks, rubs,  or gallops. Abdomen:  Soft, nontender and nondistended. Normal bowel sounds.   Neuro/Psych:  Alert and cooperative. Normal mood and affect. A and O x 3   @K .Denzil Magnuson, MD [phone] Mon-Fri 8a-5p [phone] after 5p, weekends, holidays 02/21/2016 2:59 PM@

## 2016-03-01 NOTE — ED Notes (Signed)
Patient assigned to appropriate care area. Patient oriented to unit/care area: Informed that, for their safety, care areas are designed for safety and monitored by security cameras at all times; and visiting hours explained to patient. Patient verbalizes understanding, and verbal contract for safety obtained. 

## 2016-03-01 NOTE — ED Notes (Signed)
Pt. Noted in room. No complaints or concerns voiced. No distress or abnormal behavior noted. Will continue to monitor with security cameras. Q 15 minute rounds continue. 

## 2016-03-01 NOTE — ED Notes (Signed)
Patient keeps stating "the group home does not give me the medications I need" This RN spoke with patient and told her the doctor has reviewed her meds and what the group home gives her is what she needs to take. No new medications are being sent home with patient. Spoke with group home leader and she is aware about no new meds. Patient states she does not want to go back to group home because she does not like it there and states "I will be back"

## 2016-03-01 NOTE — Interval H&P Note (Signed)
History and Physical Interval Note:  03/01/2016 10:33 AM  Melanie Bell  has presented today for surgery, with the diagnosis of Foregin Body Removal  The various methods of treatment have been discussed with the patient and family. After consideration of risks, benefits and other options for treatment, the patient has consented to  Procedure(s): ESOPHAGOGASTRODUODENOSCOPY (EGD) WITH PROPOFOL (N/A) as a surgical intervention .  The patient's history has been reviewed, patient examined, no change in status, stable for surgery.  I have reviewed the patient's chart and labs.  Questions were answered to the patient's satisfaction.     Lexii Walsh [employer]

## 2016-03-01 NOTE — ED Notes (Signed)
MD cleared patient and reports patient does not need a 1:1 sitter

## 2016-03-01 NOTE — ED Provider Notes (Signed)
[facility] Emergency Department Provider Note  Time seen: 4:28 PM  I have reviewed the triage vital signs and the nursing notes.   HISTORY  Chief Complaint Suicidal    HPI Melanie Bell is a 25 y.o. female with a past medical history of anxiety, depression, hallucinations, form body ingestions, who presents to the emergency department after swallowing a tack and a bottle of facial astringent.Patient was just seen in the emergency department 2 days ago for swallowing a battery, earlier today for depression and hallucinations, now returns under an IVC for swallowing a thumb tack, a bottle of facial astringent, and claiming she wants to kill her self. Patient states a mild burning sensation in the upper abdomen. No other medical complaints at this time.  Past Medical History:  Diagnosis Date  . Anxiety   . Asthma   . Depression   . GERD (gastroesophageal reflux disease)   . Hallucinations 09/30/2014   Sizoaffective  . Hyperlipidemia   . Hypertension   . Tardive dyskinesia 10/2014   recent onset    Patient Active Problem List   Diagnosis Date Noted  . Gastric foreign body   . Foreign body ingestion, initial encounter 07/31/2015  . Schizoaffective disorder, bipolar type ([place]) 11/06/2014  . Tobacco use disorder 09/30/2014  . Hypothyroidism 09/29/2014  . Hypertension 09/29/2014  . Constipation 09/29/2014    Past Surgical History:  Procedure Laterality Date  . ABDOMINAL SURGERY     "years ago" to remove foreign objects  . APPENDECTOMY    . BREAST LUMPECTOMY Right   . COLONOSCOPY WITH PROPOFOL N/A 09/10/2015   Procedure: COLONOSCOPY WITH PROPOFOL;  Surgeon: [name], MD;  Location: [facility] ENDOSCOPY;  Service: Endoscopy;  Laterality: N/A;  . ESOPHAGOGASTRODUODENOSCOPY N/A 11/28/2014   Procedure: ESOPHAGOGASTRODUODENOSCOPY (EGD);  Surgeon: [name], MD;  Location: [facility] ENDOSCOPY;  Service: Endoscopy;  Laterality: N/A;  .  ESOPHAGOGASTRODUODENOSCOPY N/A 02/21/2016   Procedure: ESOPHAGOGASTRODUODENOSCOPY (EGD);  Surgeon: Mauri Pole, MD;  Location: [facility] ENDOSCOPY;  Service: Endoscopy;  Laterality: N/A;  . LAPAROTOMY N/A 09/12/2015   Procedure: EXPLORATORY LAPAROTOMY;  Surgeon: Florene Glen, MD;  Location: [facility] ORS;  Service: General;  Laterality: N/A;  . SIGMOIDOSCOPY N/A 09/12/2015   Procedure: Lonell Face;  Surgeon: Florene Glen, MD;  Location: [facility] ORS;  Service: General;  Laterality: N/A;  . WISDOM TOOTH EXTRACTION      Prior to Admission medications   Medication Sig Start Date End Date Taking? Authorizing Provider  amLODipine (NORVASC) 2.5 MG tablet Take 1 tablet (2.5 mg total) by mouth daily. 06/19/15   [name], MD  benztropine (COGENTIN) 1 MG tablet Take 1 mg by mouth at bedtime.    Historical Provider, MD  fluPHENAZine (PROLIXIN) 5 MG tablet Take 5-10 mg by mouth See admin instructions. Take 5 mg by mouth in the morning, take 5 mg by mouth in the afternoon and take 10 mg by mouth at bedtime.    Historical Provider, MD  hydrOXYzine (VISTARIL) 50 MG capsule Take 50 mg by mouth 2 (two) times daily as needed for anxiety.    Historical Provider, MD  levothyroxine (SYNTHROID, LEVOTHROID) 75 MCG tablet Take 1 tablet (75 mcg total) by mouth daily before breakfast. 06/19/15   [name] [name], MD  Lurasidone HCl (LATUDA) 120 MG TABS Take 120 mg by mouth every evening.    Historical Provider, MD  OXcarbazepine (TRILEPTAL) 150 MG tablet Take 1 tablet (150 mg total) by mouth 2 (two) times daily.  06/19/15   [name], MD  REGLAN 5 MG tablet Take 1 tablet (5 mg total) by mouth 3 (three) times daily. 12/13/15 12/14/15  Lavonia Drafts, MD  traZODone (DESYREL) 50 MG tablet Take 50 mg by mouth at bedtime.    Historical Provider, MD  venlafaxine XR (EFFEXOR-XR) 150 MG 24 hr capsule Take 1 capsule (150 mg total) by mouth daily with breakfast. 06/19/15   [name], MD    Allergies   Allergen Reactions  . Betadine [Povidone Iodine] Other (See Comments)    Reaction:  Unknown   . Iodine Rash  . Shellfish-Derived Products Other (See Comments)    Reaction:  Unknown     Family History  Problem Relation Age of Onset  . Depression Mother   . Hypertension Mother     Social History Social History  Substance Use Topics  . Smoking status: Current Every Day Smoker    Packs/day: 0.50    Years: 3.00    Types: Cigarettes  . Smokeless tobacco: Never Used  . Alcohol use No    Review of Systems Constitutional: Negative for fever. Cardiovascular: Negative for chest pain. Respiratory: Negative for shortness of breath. Gastrointestinal: Mild epigastric burning. Denies vomiting. Denies diarrhea. Neurological: Negative for headache 10-point ROS otherwise negative.  ____________________________________________   PHYSICAL EXAM:  VITAL SIGNS: ED Triage Vitals  Enc Vitals Group     BP 03/01/16 1614 110/80     Pulse Rate 03/01/16 1614 93     Resp 03/01/16 1614 18     Temp 03/01/16 1614 98.4 F (36.9 C)     Temp Source 03/01/16 1614 Oral     SpO2 03/01/16 1614 100 %     Weight 03/01/16 1615 200 lb (90.7 kg)     Height --      Head Circumference --      Peak Flow --      Pain Score 03/01/16 1615 7     Pain Loc --      Pain Edu? --      Excl. in [place]? --     Constitutional: Alert and oriented. Well appearing and in no distress. Eyes: Normal exam ENT   Head: Normocephalic and atraumatic.   Mouth/Throat: Mucous membranes are moist. Cardiovascular: Normal rate, regular rhythm. No murmur Respiratory: Normal respiratory effort without tachypnea nor retractions. Breath sounds are clear  Gastrointestinal: Soft, states mild tenderness to palpation of the epigastrium, but no reaction to palpation. No rebound or guarding. Musculoskeletal: Nontender with normal range of motion in all extremities.  Neurologic:  Normal speech and language. No gross focal neurologic  deficits  Psychiatric: States suicidal ideation.  ____________________________________________   RADIOLOGY  X-ray confirms foreign body. No free air.   EKG reviewed and interpreted by myself shows normal sinus rhythm at 79 bpm, narrow QRS, normal axis, normal intervals, no ST changes. Normal EKG.  ____________________________________________   INITIAL IMPRESSION / ASSESSMENT AND PLAN / ED COURSE  Pertinent labs & imaging results that were available during my care of the patient were reviewed by me and considered in my medical decision making (see chart for details).  The patient presents to the emergency department after swallowing a thumb tack and a myofascial astringent. We'll obtain an x-ray to evaluate for foreign body and rule out free air. No peritoneal signs on examination. We will also obtain labs. Charge nurse called poison control regarding the astringent ingestion, states diarrhea as a possibility but no other adverse side effects. We will  closely monitor in the emergency department while awaiting results. Patient is currently under IVC.  Labs are largely within normal limits. X-ray consistent with foreign body but no free air. As far as the foreign body we will continue to monitor the patient, and reimage the patient developed sudden pain. Currently she is in no distress appears well in no discomfort, expectant management. We will have the patient be seen by psychiatry for further treatment and disposition recommendation.  ____________________________________________   FINAL CLINICAL IMPRESSION(S) / ED DIAGNOSES  Swallowed foreign body Suicidal ideation    Harvest Dark, MD 03/01/16 2149

## 2016-03-01 NOTE — ED Provider Notes (Signed)
[facility] Emergency Department Provider Note        Time seen: ----------------------------------------- 8:57 AM on 03/01/2016 -----------------------------------------    I have reviewed the triage vital signs and the nursing notes.   HISTORY  Chief Complaint Hallucinations and Suicidal    HPI Melanie Bell is a 25 y.o. female well-known to the ER who presents for suicidal thoughts and hearing voices. Patient has voices tell her to kill herself, states she was here last night after swallowing a battery but was discharged home. Patient states the group home she is staying and will not give further medications supposed be taking. She denies any medical complaints or recent ingestion.   Past Medical History:  Diagnosis Date  . Anxiety   . Asthma   . Depression   . GERD (gastroesophageal reflux disease)   . Hallucinations 09/30/2014   Sizoaffective  . Hyperlipidemia   . Hypertension   . Tardive dyskinesia 10/2014   recent onset    Patient Active Problem List   Diagnosis Date Noted  . Gastric foreign body   . Foreign body ingestion, initial encounter 07/31/2015  . Schizoaffective disorder, bipolar type ([place]) 11/06/2014  . Tobacco use disorder 09/30/2014  . Hypothyroidism 09/29/2014  . Hypertension 09/29/2014  . Constipation 09/29/2014    Past Surgical History:  Procedure Laterality Date  . ABDOMINAL SURGERY     "years ago" to remove foreign objects  . APPENDECTOMY    . BREAST LUMPECTOMY Right   . COLONOSCOPY WITH PROPOFOL N/A 09/10/2015   Procedure: COLONOSCOPY WITH PROPOFOL;  Surgeon: [name], MD;  Location[facility] END[facility]COPY;  Service: Endoscopy;  Laterality: N/A;  . ESOPHAGOGASTRODUODENOSCOPY N/A 11/28/2014   Procedure: ESOPHAGOGASTRODUODENOSCOPY (EGD);  Surgeon: [name], MD;  Location: [facility] ENDOSCOPY;  Service: Endoscopy;  Laterality: N/A;  . ESOPHAGOGASTRODUODENOSCOPY N/A 02/21/2016   Procedure:  ESOPHAGOGASTRODUODENOSCOPY (EGD);  Surgeon: Mauri Pole, MD;  Location: [facility] ENDOSCOPY;  Service: Endoscopy;  Laterality: N/A;  . LAPAROTOMY N/A 09/12/2015   Procedure: EXPLORATORY LAPAROTOMY;  Surgeon: Florene Glen, MD;  Location: [facility] ORS;  Service: General;  Laterality: N/A;  . SIGMOIDOSCOPY N/A 09/12/2015   Procedure: Lonell Face;  Surgeon: Florene Glen, MD;  Location: [facility] ORS;  Service: General;  Laterality: N/A;  . WISDOM TOOTH EXTRACTION      Allergies Betadine [povidone iodine]; Iodine; and Shellfish-derived products  Social History Social History  Substance Use Topics  . Smoking status: Current Every Day Smoker    Packs/day: 0.50    Years: 3.00    Types: Cigarettes  . Smokeless tobacco: Never Used  . Alcohol use No    Review of Systems Constitutional: Negative for fever. Cardiovascular: Negative for chest pain. Respiratory: Negative for shortness of breath. Gastrointestinal: Negative for abdominal pain, vomiting and diarrhea. Genitourinary: Negative for dysuria. Musculoskeletal: Negative for back pain. Skin: Negative for rash. Neurological: Negative for headaches, focal weakness or numbness. Psychiatric: Positive for suicidal ideation, hallucinations  10-point ROS otherwise negative.  ____________________________________________   PHYSICAL EXAM:  VITAL SIGNS: ED Triage Vitals  Enc Vitals Group     BP 03/01/16 0840 136/85     Pulse Rate 03/01/16 0840 82     Resp 03/01/16 0840 18     Temp 03/01/16 0840 98.4 F (36.9 C)     Temp Source 03/01/16 0840 Oral     SpO2 03/01/16 0840 100 %     Weight 03/01/16 0841 200 lb (90.7 kg)     Height --  Head Circumference --      Peak Flow --      Pain Score 03/01/16 0841 0     Pain Loc --      Pain Edu? --      Excl. in [place]? --     Constitutional: Alert and oriented. Well appearing and in no distress. Eyes: Conjunctivae are normal. PERRL. Normal extraocular movements. ENT   Head:  Normocephalic and atraumatic.   Nose: No congestion/rhinnorhea.   Mouth/Throat: Mucous membranes are moist.   Neck: No stridor. Cardiovascular: Normal rate, regular rhythm. No murmurs, rubs, or gallops. Respiratory: Normal respiratory effort without tachypnea nor retractions. Breath sounds are clear and equal bilaterally. No wheezes/rales/rhonchi. Gastrointestinal: Soft and nontender. Normal bowel sounds Musculoskeletal: Nontender with normal range of motion in all extremities. No lower extremity tenderness nor edema. Neurologic:  Normal speech and language. No gross focal neurologic deficits are appreciated.  Skin:  Skin is warm, dry and intact. No rash noted. Psychiatric: Depressed mood and affect ___________________________________________  ED COURSE:  Pertinent labs & imaging results that were available during my care of the patient were reviewed by me and considered in my medical decision making (see chart for details). Clinical Course  Patient with manipulative behavior, is not felt to be an imminent threat to herself. She will likely ingest something again at some point and I'm not sure this is pretty minimal. She appears stable for outpatient follow-up.  Procedures  ____________________________________________  FINAL ASSESSMENT AND PLAN  Suicidal ideation  Plan: Patient with suicidal ideation but has more attention seeking behavior. She is not felt to be an imminent threat to herself or others.   Earleen Newport, MD   Note: This dictation was prepared with Dragon dictation. Any transcriptional errors that result from this process are unintentional    Earleen Newport, MD 03/01/16 1037
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
## 2016-03-01 NOTE — ED Triage Notes (Signed)
Pt to ed with c/o suicidal thoughts and hearing voices that tell her to kill herself.  Pt states she was here last night after swallowing a battery but was d/c home.  Pt states the group home she is staying at will not give her the meds she is supposed to be taking.

## 2016-03-01 NOTE — BH Assessment (Signed)
Assessment Note  Melanie Bell is an 25 y.o. female presenting  to the emergency department after swallowing a tack and a bottle of facial astringent, claiming she wants to kill herself. She was seen in the ED two days ago for swallowing a battery.  She states if she is discharged she will be right back because she states she is not going to stop swallowing items.  Patient was evaluated by Dr. Weber Cooks and did not meet criteria for inpatient hospitalization.  Diagnosis: Anxiety  Past Medical History:  Past Medical History:  Diagnosis Date  . Anxiety   . Asthma   . Depression   . GERD (gastroesophageal reflux disease)   . Hallucinations 09/30/2014   Sizoaffective  . Hyperlipidemia   . Hypertension   . Tardive dyskinesia 10/2014   recent onset    Past Surgical History:  Procedure Laterality Date  . ABDOMINAL SURGERY     "years ago" to remove foreign objects  . APPENDECTOMY    . BREAST LUMPECTOMY Right   . COLONOSCOPY WITH PROPOFOL N/A 09/10/2015   Procedure: COLONOSCOPY WITH PROPOFOL;  Surgeon: [name], MD;  Location: [facility] ENDOSCOPY;  Service: Endoscopy;  Laterality: N/A;  . ESOPHAGOGASTRODUODENOSCOPY N/A 11/28/2014   Procedure: ESOPHAGOGASTRODUODENOSCOPY (EGD);  Surgeon: [name], MD;  Location: [facility] ENDOSCOPY;  Service: Endoscopy;  Laterality: N/A;  . ESOPHAGOGASTRODUODENOSCOPY N/A 02/21/2016   Procedure: ESOPHAGOGASTRODUODENOSCOPY (EGD);  Surgeon: Mauri Pole, MD;  Location: [facility] ENDOSCOPY;  Service: Endoscopy;  Laterality: N/A;  . LAPAROTOMY N/A 09/12/2015   Procedure: EXPLORATORY LAPAROTOMY;  Surgeon: Florene Glen, MD;  Location: [facility] ORS;  Service: General;  Laterality: N/A;  . SIGMOIDOSCOPY N/A 09/12/2015   Procedure: Lonell Face;  Surgeon: Florene Glen, MD;  Location: [facility] ORS;  Service: General;  Laterality: N/A;  . WISDOM TOOTH EXTRACTION      Family History:  Family History  Problem Relation Age of Onset  . Depression Mother   .  Hypertension Mother     Social History:  reports that she has been smoking Cigarettes.  She has a 1.50 pack-year smoking history. She has never used smokeless tobacco. She reports that she does not drink alcohol or use drugs.  Additional Social History:  Alcohol / Drug Use History of alcohol / drug use?: No history of alcohol / drug abuse  CIWA: CIWA-Ar BP: 118/74 Pulse Rate: 96 COWS:    Allergies:  Allergies  Allergen Reactions  . Betadine [Povidone Iodine] Other (See Comments)    Reaction:  Unknown   . Iodine Rash  . Shellfish-Derived Products Other (See Comments)    Reaction:  Unknown     Home Medications:  (Not in a hospital admission)  OB/GYN Status:  No LMP recorded. Patient has had an injection.  General Assessment Data Location of Assessment: [facility] ED TTS Assessment: In system Is this a Tele or Face-to-Face Assessment?: Face-to-Face Is this an Initial Assessment or a Re-assessment for this encounter?: Initial Assessment Marital status: Single Maiden name: n/a Is patient pregnant?: No Pregnancy Status: No Living Arrangements: Group Home ([facility]. [phone]) Can pt return to current living arrangement?: Yes Admission Status: Involuntary Is patient capable of signing voluntary admission?: No Referral Source: Self/Family/Friend Insurance type: Medicaid  Medical Screening Exam ([place]) Medical Exam completed: Yes  Crisis Care Plan Living Arrangements: Group Home ([facility]. [phone]) Legal Guardian: Mother [name] [phone]) Name of Psychiatrist: [facility] Name of Therapist: [facility]  Education Status Is patient currently in school?: No Current Grade:  n/a Highest grade of school patient has completed: GED Name of school: [employer] person: n/a  Risk to self with the past 6 months Suicidal Ideation: No Has patient been a risk to self within the past 6 months prior to admission? : Yes Suicidal Intent: No Has patient had any  suicidal intent within the past 6 months prior to admission? : Yes Is patient at risk for suicide?: No Suicidal Plan?: No Has patient had any suicidal plan within the past 6 months prior to admission? : Yes Specify Current Suicidal Plan: Patient swallows foreign bodies Access to Means: Yes Specify Access to Suicidal Means: Patient has access to objects What has been your use of drugs/alcohol within the last 12 months?: Patient denies use Previous Attempts/Gestures: Yes How many times?: 11 Other Self Harm Risks: swallowing items Triggers for Past Attempts: Unknown Intentional Self Injurious Behavior: Damaging Comment - Self Injurious Behavior: patient swallows items Family Suicide History: No Recent stressful life event(s): Trauma (Comment) Persecutory voices/beliefs?: Yes Depression: Yes Depression Symptoms: Feeling angry/irritable, Feeling worthless/self pity, Loss of interest in usual pleasures, Isolating Substance abuse history and/or treatment for substance abuse?: No Suicide prevention information given to non-admitted patients: Not applicable  Risk to Others within the past 6 months Homicidal Ideation: No Does patient have any lifetime risk of violence toward others beyond the six months prior to admission? : No Thoughts of Harm to Others: No Current Homicidal Plan: No Access to Homicidal Means: No Identified Victim: None identified History of harm to others?: No Assessment of Violence: None Noted Violent Behavior Description: None identified Does patient have access to weapons?: No Criminal Charges Pending?: No Does patient have a court date: No Is patient on probation?: No  Psychosis Hallucinations: Auditory (Past auditory) Delusions: None noted  Mental Status Report Appearance/Hygiene: In hospital gown Eye Contact: Good Motor Activity: Freedom of movement Speech: Logical/coherent Level of Consciousness: Quiet/awake Mood: Irritable Affect: Irritable Anxiety  Level: Minimal Thought Processes: Relevant, Coherent Judgement: Partial Orientation: Appropriate for developmental age Obsessive Compulsive Thoughts/Behaviors: None  Cognitive Functioning Concentration: Decreased Memory: Recent Intact, Remote Intact IQ: Average Insight: Poor Impulse Control: Poor Appetite: Good Weight Loss: 0 Weight Gain: 0 Sleep: Increased Total Hours of Sleep: 10 Vegetative Symptoms: None  ADLScreening [facility] Assessment Services) Patient's cognitive ability adequate to safely complete daily activities?: Yes Patient able to express need for assistance with ADLs?: Yes Independently performs ADLs?: Yes (appropriate for developmental age)  Prior Inpatient Therapy Prior Inpatient Therapy: Yes Prior Therapy Dates: 2017 Prior Therapy Facilty/Provider(s): [facility] Reason for Treatment: Suicidal ideation  Prior Outpatient Therapy Prior Outpatient Therapy: Yes Prior Therapy Dates: current Prior Therapy Facilty/Provider(s): [facility] Reason for Treatment: Schizoprhenia Does patient have an ACCT team?: Yes Does patient have Intensive In-House Services?  : No Does patient have [facility] services? : No Does patient have P4CC services?: No  ADL Screening (condition at time of admission) Patient's cognitive ability adequate to safely complete daily activities?: Yes Patient able to express need for assistance with ADLs?: Yes Independently performs ADLs?: Yes (appropriate for developmental age)       Abuse/Neglect Assessment (Assessment to be complete while patient is alone) Physical Abuse: Denies Verbal Abuse: Denies Sexual Abuse: Denies Exploitation of patient/patient's resources: Denies Self-Neglect: Denies Values / Beliefs Cultural Requests During Hospitalization: None Spiritual Requests During Hospitalization: None Consults Spiritual Care Consult Needed: No Social Work Consult Needed: No      Additional Information 1:1 In Past 12 Months?: No CIRT Risk:  No Elopement Risk: No Does  patient have medical clearance?: Yes     Disposition:  Disposition Initial Assessment Completed for this Encounter: Yes Disposition of Patient: Other dispositions, Outpatient treatment Type of outpatient treatment: Adult (Patient has been cleared by Dr. Weber Cooks and to be d/c .)  On Site Evaluation by:   Reviewed with Physician:    Oneita Hurt 03/01/2016 9:43 PM

## 2016-03-01 NOTE — ED Notes (Signed)

## 2016-03-01 NOTE — ED Triage Notes (Signed)
Pt to ed via ems after swallowing tacks and facial astringent.  Per poison control and Charge RN casey, astringent will cause diarrhea but no other monitoring is necessary.  Pt alert and oriented and states she is going to continue this behavior as long as she lives.  When asked why she calls for help after she swallows the objects she reports she gets scared.  Pt also verbalized she hates her group home and does this to get away from there.  She states they have given her a 30 day notice to get out.  Pt defiant regarding her behaviors.  Reports she wants to die and that no one believes her.

## 2016-03-01 NOTE — ED Notes (Addendum)
Pt. To BHU from ED ambulatory without difficulty, to room  BHU 2. Report from [facility] Pt. Is alert and oriented, warm and dry in no distress. Pt. Denies HI. Pt reports having passive SI and hearing a voice telling her "I am worthless". Pt. Calm and cooperative. Pt. Made aware of security cameras and Q15 minute rounds. Pt. Encouraged to let Nursing staff know of any concerns or needs.

## 2016-03-01 NOTE — ED Notes (Signed)
PT IVC PENDING CONSULT  

## 2016-03-02 MED ORDER — TRAZODONE HCL 50 MG PO TABS
50.0000 mg | ORAL_TABLET | Freq: Every day | ORAL | Status: DC
  Administered 2016-03-02: 50 mg via ORAL
  Filled 2016-03-02: qty 1

## 2016-03-02 MED ORDER — LURASIDONE HCL 80 MG PO TABS
120.0000 mg | ORAL_TABLET | Freq: Every day | ORAL | Status: DC
  Administered 2016-03-02: 120 mg via ORAL
  Filled 2016-03-02: qty 1

## 2016-03-02 MED ORDER — OXCARBAZEPINE 150 MG PO TABS
150.0000 mg | ORAL_TABLET | Freq: Two times a day (BID) | ORAL | Status: DC
  Administered 2016-03-02: 150 mg via ORAL
  Filled 2016-03-02 (×2): qty 1

## 2016-03-02 MED ORDER — FLUPHENAZINE HCL 5 MG PO TABS
5.0000 mg | ORAL_TABLET | Freq: Two times a day (BID) | ORAL | Status: DC
  Administered 2016-03-02: 5 mg via ORAL
  Filled 2016-03-02 (×2): qty 1

## 2016-03-02 MED ORDER — DOCUSATE SODIUM 100 MG PO CAPS
100.0000 mg | ORAL_CAPSULE | Freq: Two times a day (BID) | ORAL | Status: DC
  Administered 2016-03-02: 100 mg via ORAL
  Filled 2016-03-02: qty 1

## 2016-03-02 MED ORDER — BENZTROPINE MESYLATE 1 MG PO TABS
1.0000 mg | ORAL_TABLET | Freq: Every day | ORAL | Status: DC
Start: 2016-03-02 — End: 2016-03-03
  Administered 2016-03-02: 1 mg via ORAL
  Filled 2016-03-02: qty 1

## 2016-03-02 MED ORDER — AMLODIPINE BESYLATE 5 MG PO TABS
2.5000 mg | ORAL_TABLET | Freq: Every day | ORAL | Status: DC
  Administered 2016-03-02: 2.5 mg via ORAL
  Filled 2016-03-02: qty 1

## 2016-03-02 MED ORDER — VENLAFAXINE HCL ER 75 MG PO CP24: 150.0000 mg | ORAL_CAPSULE | Freq: Every day | ORAL | Status: DC

## 2016-03-02 MED ORDER — LEVOTHYROXINE SODIUM 75 MCG PO TABS: 75.0000 ug | ORAL_TABLET | Freq: Every day | ORAL | Status: DC

## 2016-03-02 MED ORDER — HYDROXYZINE HCL 25 MG PO TABS
50.0000 mg | ORAL_TABLET | Freq: Two times a day (BID) | ORAL | Status: DC
  Administered 2016-03-02: 50 mg via ORAL
  Filled 2016-03-02: qty 2

## 2016-03-02 MED ORDER — FLUPHENAZINE HCL 10 MG PO TABS
10.0000 mg | ORAL_TABLET | Freq: Every day | ORAL | Status: DC
  Administered 2016-03-02: 10 mg via ORAL
  Filled 2016-03-02: qty 1

## 2016-03-02 MED ORDER — MAGNESIUM CITRATE PO SOLN
1.0000 | Freq: Once | ORAL | Status: AC
  Administered 2016-03-02: 1 via ORAL
  Filled 2016-03-02: qty 296

## 2016-03-02 NOTE — ED Notes (Signed)
Pt. Noted in room. No complaints or concerns voiced. No distress or abnormal behavior noted. Will continue to monitor with security cameras. Q 15 minute rounds continue. 

## 2016-03-02 NOTE — Progress Notes (Addendum)
Patient is to be admitted to [place] by Dr. Weber Cooks.  Attending Physician will be Dr. Jerilee Hoh.   Patient has been assigned to room 301, by [facility] Charge Nurse Gwenn.   Intake Paper Work has been signed and placed on patient chart.  ER staff is aware of the admission Lattie Haw , ER Sect.; Dr. Quentin Cornwall, ER MD; Estill Bamberg, Patient's Nurse &  Patient Access). Note: Patient can be admitted after 7:30 p.m.  Yotam Rhine K. Nash Shearer, LPC-A, [facility]  Counselor 03/02/2016 4:27 PM

## 2016-03-02 NOTE — ED Notes (Signed)
BEHAVIORAL HEALTH ROUNDING Patient sleeping: Yes.   Patient alert and oriented: not applicable SLEEPING Behavior appropriate: Yes.  ; If no, describe: SLEEPING Nutrition and fluids offered: No SLEEPING Toileting and hygiene offered: NoSLEEPING Sitter present: not applicable, Q 15 min safety rounds and observation via security camera. Law enforcement present: Yes ODS 

## 2016-03-02 NOTE — ED Notes (Signed)
Resting in room with even, unlabored respirations noted. No acute distress. Calm and cooperative. Safety maintained with every 15 minute checks and security cameras in place. Will continue to monitor

## 2016-03-02 NOTE — ED Notes (Signed)
BEHAVIORAL HEALTH ROUNDING  Patient sleeping: No.  Patient alert and oriented: yes  Behavior appropriate: Yes. ; If no, describe:  Nutrition and fluids offered: Yes  Toileting and hygiene offered: Yes  Sitter present: not applicable, Q 15 min safety rounds and observation via security camera. Law enforcement present: Yes ODS  

## 2016-03-02 NOTE — ED Notes (Signed)
Pt cooperative with shower and teeth brushing. Hygiene products kept at nurse's station in pt specific bag. Safety maintained with every 15 minutes and security cameras in place. Will continue to monitor.

## 2016-03-02 NOTE — ED Notes (Signed)
ENVIRONMENTAL ASSESSMENT  Potentially harmful objects out of patient reach: Yes.  Personal belongings secured: Yes.  Patient dressed in hospital provided attire only: Yes.  Plastic bags out of patient reach: Yes.  Patient care equipment (cords, cables, call bells, lines, and drains) shortened, removed, or accounted for: Yes.  Equipment and supplies removed from bottom of stretcher: Yes.  Potentially toxic materials out of patient reach: Yes.  Sharps container removed or out of patient reach: Yes.   ED BHU [place]  Is the patient under IVC or is there intent for IVC: Yes.  Is the patient medically cleared: Yes.  Is there vacancy in the ED BHU: Yes.  Is the population mix appropriate for patient: Yes.  Is the patient awaiting placement in inpatient or outpatient setting: Yes.  Has the patient had a psychiatric consult: Yes.  Survey of unit performed for contraband, proper placement and condition of furniture, tampering with fixtures in bathroom, shower, and each patient room: Yes. ; Findings: All clear  APPEARANCE/BEHAVIOR  calm, cooperative and adequate rapport can be established  NEURO ASSESSMENT  Orientation: time, place and person  Hallucinations: No.None noted (Hallucinations)  Speech: Normal  Gait: normal  RESPIRATORY ASSESSMENT  WNL  CARDIOVASCULAR ASSESSMENT  WNL  GASTROINTESTINAL ASSESSMENT  WNL  EXTREMITIES  WNL  PLAN OF CARE  Provide calm/safe environment. Vital signs assessed twice daily. ED BHU Assessment once each 12-hour shift. Collaborate with TTS daily or as condition indicates. Assure the ED provider has rounded once each shift. Provide and encourage hygiene. Provide redirection as needed. Assess for escalating behavior; address immediately and inform ED provider.  Assess family dynamic and appropriateness for visitation as needed: Yes. ; If necessary, describe findings:  Educate the patient/family about BHU procedures/visitation: Yes. ; If  necessary, describe findings: Pt is calm and cooperative at this time. Pt understanding and accepting of unit procedures/rules. Will continue to monitor with Q 15 min safety rounds and observation via security camera.   BEHAVIORAL HEALTH ROUNDING  Patient sleeping: No.  Patient alert and oriented: yes  Behavior appropriate: Yes. ; If no, describe:  Nutrition and fluids offered: Yes  Toileting and hygiene offered: Yes  Sitter present: not applicable, Q 15 min safety rounds and observation via security camera. Law enforcement present: Yes ODS

## 2016-03-02 NOTE — ED Notes (Signed)
Food/fluids provided. Calm and cooperative in room eating meal. No acute distress noted. Safety maintained with every 15 minute checks and security cameras in place. Will continue to monitor.

## 2016-03-02 NOTE — ED Provider Notes (Signed)
-----------------------------------------   7:20 AM on 03/02/2016 -----------------------------------------   Blood pressure 132/78, pulse 63, temperature 98.1 F (36.7 C), temperature source Oral, resp. rate 18, weight 200 lb (90.7 kg), SpO2 98 %.  The patient had no acute events since last update.  Calm and cooperative at this time.  Disposition is pending Psychiatry/Behavioral Medicine team recommendations.     Merlyn Lot, MD 03/02/16 [phone]

## 2016-03-02 NOTE — ED Notes (Signed)
Food/fluids provided. Pt resting in her room without acute distress. Calm and cooperative. Safety maintained with every 15 minute checks and security cameras in place. Will continue to monitor.

## 2016-03-02 NOTE — Consult Note (Signed)
[facility] Consult   Reason for Consult:  Consult and follow-up for 25 year old woman with schizoaffective disorder Referring Physician:  Quentin Cornwall Patient Identification: GENEVIEVE ARBAUGH MRN:  [id number] Principal Diagnosis: Schizoaffective disorder Diagnosis:   Patient Active Problem List   Diagnosis Date Noted  . Gastric foreign body [T18.2XXA]   . Foreign body ingestion, initial encounter [T18.9XXA] 07/31/2015  . Schizoaffective disorder, bipolar type ([place]) [F25.0] 11/06/2014  . Tobacco use disorder [F17.200] 09/30/2014  . Hypothyroidism [E03.9] 09/29/2014  . Hypertension [I10] 09/29/2014  . Constipation [K59.00] 09/29/2014    Total Time spent with patient: 30 minutes  Subjective:   DIASHA CASTLEMAN is a 25 y.o. female patient admitted with "I'm feeling the same".  HPI:  Follow-up for this patient well known to [name] from multiple encounters. The day before yesterday she was seen in the emergency room for having swallowed a battery. I had suggested she be discharged back to her group home. Apparently as soon as she got there she swallowed a thumbtack or pushpin. She was brought back to the hospital. Patient says she is still suicidal and wishes she were dead. She says she is tired of being depressed and sick and having auditory hallucinations. She denies that anything new bad or upsetting happened to her after release from the emergency room. It sounds like she was probably planning on acting out no matter what although she will not put it in those terms. She says it is just a natural fact of her being so depressed and suicidal.  Patient says that symptoms of been getting worse recently despite compliance with medicine. She says she is still having active thoughts of wanting to die although she has not acted to harm herself here in the emergency room.  Past Psychiatric History: Long-standing history of mental health problems with multiple episodes of self injury.  Swallowing dangerous objects is a common habit of hers. Also has done a lot of other self injury. Diagnosis of schizoaffective disorder. Multiple medicines with only partial response  Risk to Self: Suicidal Ideation: No Suicidal Intent: No Is patient at risk for suicide?: No Suicidal Plan?: No Specify Current Suicidal Plan: Patient swallows foreign bodies Access to Means: Yes Specify Access to Suicidal Means: Patient has access to objects What has been your use of drugs/alcohol within the last 12 months?: Patient denies use How many times?: 11 Other Self Harm Risks: swallowing items Triggers for Past Attempts: Unknown Intentional Self Injurious Behavior: Damaging Comment - Self Injurious Behavior: patient swallows items Risk to Others: Homicidal Ideation: No Thoughts of Harm to Others: No Current Homicidal Plan: No Access to Homicidal Means: No Identified Victim: None identified History of harm to others?: No Assessment of Violence: None Noted Violent Behavior Description: None identified Does patient have access to weapons?: No Criminal Charges Pending?: No Does patient have a court date: No Prior Inpatient Therapy: Prior Inpatient Therapy: Yes Prior Therapy Dates: 2017 Prior Therapy Facilty/Provider(s): [facility] Reason for Treatment: Suicidal ideation Prior Outpatient Therapy: Prior Outpatient Therapy: Yes Prior Therapy Dates: current Prior Therapy Facilty/Provider(s): [facility] Reason for Treatment: Schizoprhenia Does patient have an ACCT team?: Yes Does patient have Intensive In-House Services?  : No Does patient have [facility] services? : No Does patient have P4[facility] services?: No  Past Medical History:  Past Medical History:  Diagnosis Date  . Anxiety   . Asthma   . Depression   . GERD (gastroesophageal reflux disease)   . Hallucinations 09/30/2014   Sizoaffective  . Hyperlipidemia   .  Hypertension   . Tardive dyskinesia 10/2014   recent onset    Past Surgical History:   Procedure Laterality Date  . ABDOMINAL SURGERY     "years ago" to remove foreign objects  . APPENDECTOMY    . BREAST LUMPECTOMY Right   . COLONOSCOPY WITH PROPOFOL N/A 09/10/2015   Procedure: COLONOSCOPY WITH PROPOFOL;  Surgeon: [name], MD;  Location: [facility] ENDOSCOPY;  Service: Endoscopy;  Laterality: N/A;  . ESOPHAGOGASTRODUODENOSCOPY N/A 11/28/2014   Procedure: ESOPHAGOGASTRODUODENOSCOPY (EGD);  Surgeon: [name], MD;  Location: [facility] ENDOSCOPY;  Service: Endoscopy;  Laterality: N/A;  . ESOPHAGOGASTRODUODENOSCOPY N/A 02/21/2016   Procedure: ESOPHAGOGASTRODUODENOSCOPY (EGD);  Surgeon: Mauri Pole, MD;  Location: [facility] ENDOSCOPY;  Service: Endoscopy;  Laterality: N/A;  . LAPAROTOMY N/A 09/12/2015   Procedure: EXPLORATORY LAPAROTOMY;  Surgeon: Florene Glen, MD;  Location: [facility] ORS;  Service: General;  Laterality: N/A;  . SIGMOIDOSCOPY N/A 09/12/2015   Procedure: Lonell Face;  Surgeon: Florene Glen, MD;  Location: [facility] ORS;  Service: General;  Laterality: N/A;  . WISDOM TOOTH EXTRACTION     Family History:  Family History  Problem Relation Age of Onset  . Depression Mother   . Hypertension Mother    Family Psychiatric  History: Positive Social History:  History  Alcohol Use No     History  Drug Use No    Social History   Social History  . Marital status: Single    Spouse name: N/A  . Number of children: N/A  . Years of education: N/A   Social History Main Topics  . Smoking status: Current Every Day Smoker    Packs/day: 0.50    Years: 3.00    Types: Cigarettes  . Smokeless tobacco: Never Used  . Alcohol use No  . Drug use: No  . Sexual activity: No   Other Topics Concern  . None   Social History Narrative  . None   Additional Social History:    Allergies:   Allergies  Allergen Reactions  . Betadine [Povidone Iodine] Other (See Comments)    Reaction:  Unknown   . Iodine Rash  . Shellfish-Derived Products Other (See Comments)     Reaction:  Unknown     Labs:  Results for orders placed or performed during the hospital encounter of 03/01/16 (from the past 48 hour(s))  Comprehensive metabolic panel     Status: Abnormal   Collection Time: 03/01/16  5:11 PM  Result Value Ref Range   Sodium 138 135 - 145 mmol/L   Potassium 4.2 3.5 - 5.1 mmol/L   Chloride 106 101 - 111 mmol/L   CO2 23 22 - 32 mmol/L   Glucose, Bld 88 65 - 99 mg/dL   BUN 11 6 - 20 mg/dL   Creatinine, Ser 0.77 0.44 - 1.00 mg/dL   Calcium 9.9 8.9 - 10.3 mg/dL   Total Protein 8.1 6.5 - 8.1 g/dL   Albumin 4.4 3.5 - 5.0 g/dL   AST 17 15 - 41 U/L   ALT 8 (L) 14 - 54 U/L   Alkaline Phosphatase 80 38 - 126 U/L   Total Bilirubin 0.3 0.3 - 1.2 mg/dL   GFR calc non Af Amer >60 >60 mL/min   GFR calc Af Amer >60 >60 mL/min    Comment: (NOTE) The eGFR has been calculated using the CKD EPI equation. This calculation has not been validated in all clinical situations. eGFR's persistently <60 mL/min signify possible Chronic Kidney Disease.    Anion  gap 9 5 - 15  Ethanol     Status: Abnormal   Collection Time: 03/01/16  5:11 PM  Result Value Ref Range   Alcohol, Ethyl (B) 45 (H) <5 mg/dL    Comment:        LOWEST DETECTABLE LIMIT FOR SERUM ALCOHOL IS 5 mg/dL FOR MEDICAL PURPOSES ONLY   CBC with Differential     Status: None   Collection Time: 03/01/16  5:11 PM  Result Value Ref Range   WBC 7.1 3.6 - 11.0 K/uL   RBC 4.38 3.80 - 5.20 MIL/uL   Hemoglobin 13.3 12.0 - 16.0 g/dL   HCT 38.9 35.0 - 47.0 %   MCV 88.8 80.0 - 100.0 fL   MCH 30.4 26.0 - 34.0 pg   MCHC 34.3 32.0 - 36.0 g/dL   RDW 13.1 11.5 - 14.5 %   Platelets 260 150 - 440 K/uL   Neutrophils Relative % 65 %   Neutro Abs 4.6 1.4 - 6.5 K/uL   Lymphocytes Relative 28 %   Lymphs Abs 2.0 1.0 - 3.6 K/uL   Monocytes Relative 5 %   Monocytes Absolute 0.4 0.2 - 0.9 K/uL   Eosinophils Relative 1 %   Eosinophils Absolute 0.1 0 - 0.7 K/uL   Basophils Relative 1 %   Basophils Absolute 0.1 0 - 0.1  K/uL  Salicylate level     Status: None   Collection Time: 03/01/16  5:11 PM  Result Value Ref Range   Salicylate Lvl <7.3 2.8 - 30.0 mg/dL  Acetaminophen level     Status: Abnormal   Collection Time: 03/01/16  5:11 PM  Result Value Ref Range   Acetaminophen (Tylenol), Serum <10 (L) 10 - 30 ug/mL    Comment:        THERAPEUTIC CONCENTRATIONS VARY SIGNIFICANTLY. A RANGE OF 10-30 ug/mL MAY BE AN EFFECTIVE CONCENTRATION FOR MANY PATIENTS. HOWEVER, SOME ARE BEST TREATED AT CONCENTRATIONS OUTSIDE THIS RANGE. ACETAMINOPHEN CONCENTRATIONS >150 ug/mL AT 4 HOURS AFTER INGESTION AND >50 ug/mL AT 12 HOURS AFTER INGESTION ARE OFTEN ASSOCIATED WITH TOXIC REACTIONS.   Urinalysis complete, with microscopic     Status: Abnormal   Collection Time: 03/01/16  5:21 PM  Result Value Ref Range   Color, Urine YELLOW (A) YELLOW   APPearance CLEAR (A) CLEAR   Glucose, UA NEGATIVE NEGATIVE mg/dL   Bilirubin Urine NEGATIVE NEGATIVE   Ketones, ur NEGATIVE NEGATIVE mg/dL   Specific Gravity, Urine 1.025 1.005 - 1.030   Hgb urine dipstick NEGATIVE NEGATIVE   pH 5.0 5.0 - 8.0   Protein, ur 30 (A) NEGATIVE mg/dL   Nitrite NEGATIVE NEGATIVE   Leukocytes, UA TRACE (A) NEGATIVE   RBC / HPF NONE SEEN 0 - 5 RBC/hpf   WBC, UA 0-5 0 - 5 WBC/hpf   Bacteria, UA NONE SEEN NONE SEEN   Squamous Epithelial / LPF 6-30 (A) NONE SEEN   Mucous PRESENT   Urine Drug Screen, Qualitative     Status: None   Collection Time: 03/01/16  5:21 PM  Result Value Ref Range   Tricyclic, Ur Screen NONE DETECTED NONE DETECTED   Amphetamines, Ur Screen NONE DETECTED NONE DETECTED   MDMA (Ecstasy)Ur Screen NONE DETECTED NONE DETECTED   Cocaine Metabolite,Ur [place] NONE DETECTED NONE DETECTED   Opiate, Ur Screen NONE DETECTED NONE DETECTED   Phencyclidine (PCP) Ur S NONE DETECTED NONE DETECTED   Cannabinoid 50 Ng, Ur  NONE DETECTED NONE DETECTED   Barbiturates, Ur Screen NONE DETECTED NONE DETECTED  Benzodiazepine, Ur Scrn NONE  DETECTED NONE DETECTED   Methadone Scn, Ur NONE DETECTED NONE DETECTED    Comment: (NOTE) 676  Tricyclics, urine               Cutoff 1000 ng/mL 200  Amphetamines, urine             Cutoff 1000 ng/mL 300  MDMA (Ecstasy), urine           Cutoff 500 ng/mL 400  Cocaine Metabolite, urine       Cutoff 300 ng/mL 500  Opiate, urine                   Cutoff 300 ng/mL 600  Phencyclidine (PCP), urine      Cutoff 25 ng/mL 700  Cannabinoid, urine              Cutoff 50 ng/mL 800  Barbiturates, urine             Cutoff 200 ng/mL 900  Benzodiazepine, urine           Cutoff 200 ng/mL 1000 Methadone, urine                Cutoff 300 ng/mL 1100 1200 The urine drug screen provides only a preliminary, unconfirmed 1300 analytical test result and should not be used for non-medical 1400 purposes. Clinical consideration and professional judgment should 1500 be applied to any positive drug screen result due to possible 1600 interfering substances. A more specific alternate chemical method 1700 must be used in order to obtain a confirmed analytical result.  1800 Gas chromato graphy / mass spectrometry (GC/MS) is the preferred 1900 confirmatory method.   POC urine preg, ED     Status: None   Collection Time: 03/01/16  5:22 PM  Result Value Ref Range   Preg Test, Ur Negative Negative    Current Facility-Administered Medications  Medication Dose Route Frequency Provider Last Rate Last Dose  . amLODipine (NORVASC) tablet 2.5 mg  2.5 mg Oral Daily [name], MD      . benztropine (COGENTIN) tablet 1 mg  1 mg Oral QHS [name] [name], MD      . docusate sodium (COLACE) capsule 100 mg  100 mg Oral BID [name], MD      . fluPHENAZine (PROLIXIN) tablet 10 mg  10 mg Oral QHS [name], MD      . fluPHENAZine (PROLIXIN) tablet 5 mg  5 mg Oral BID AC [name], MD      . hydrOXYzine (ATARAX/VISTARIL) tablet 50 mg  50 mg Oral BID AC [name], MD      . Derrill Memo ON 03/03/2016] levothyroxine  (SYNTHROID, LEVOTHROID) tablet 75 mcg  75 mcg Oral QAC breakfast [name], MD      . lurasidone (LATUDA) tablet 120 mg  120 mg Oral Q supper [name], MD      . magnesium citrate solution 1 Bottle  1 Bottle Oral Once [name], MD      . OXcarbazepine (TRILEPTAL) tablet 150 mg  150 mg Oral BID [name], MD      . traZODone (DESYREL) tablet 50 mg  50 mg Oral QHS [name], MD      . Derrill Memo ON 03/03/2016] venlafaxine XR (EFFEXOR-XR) 24 hr capsule 150 mg  150 mg Oral Q breakfast [name], MD       Current Outpatient Prescriptions  Medication Sig  Dispense Refill  . amLODipine (NORVASC) 2.5 MG tablet Take 1 tablet (2.5 mg total) by mouth daily. 30 tablet 0  . benztropine (COGENTIN) 1 MG tablet Take 1 mg by mouth at bedtime.    . fluPHENAZine (PROLIXIN) 5 MG tablet Take 5-10 mg by mouth See admin instructions. Take 5 mg by mouth in the morning, take 5 mg by mouth in the afternoon and take 10 mg by mouth at bedtime.    . hydrOXYzine (VISTARIL) 50 MG capsule Take 50 mg by mouth 2 (two) times daily as needed for anxiety.    Marland Kitchen levothyroxine (SYNTHROID, LEVOTHROID) 75 MCG tablet Take 1 tablet (75 mcg total) by mouth daily before breakfast. 30 tablet 0  . Lurasidone HCl (LATUDA) 120 MG TABS Take 120 mg by mouth every evening.    . OXcarbazepine (TRILEPTAL) 150 MG tablet Take 1 tablet (150 mg total) by mouth 2 (two) times daily. 60 tablet 0  . REGLAN 5 MG tablet Take 1 tablet (5 mg total) by mouth 3 (three) times daily. 3 tablet 0  . traZODone (DESYREL) 50 MG tablet Take 50 mg by mouth at bedtime.    Marland Kitchen venlafaxine XR (EFFEXOR-XR) 150 MG 24 hr capsule Take 1 capsule (150 mg total) by mouth daily with breakfast. 30 capsule 0    Musculoskeletal: Strength & Muscle Tone: within normal limits Gait & Station: normal Patient leans: N/A  Psychiatric Specialty Exam: Physical Exam  Nursing note and vitals reviewed. Constitutional: She appears well-developed and well-nourished.   HENT:  Head: Normocephalic and atraumatic.  Eyes: Conjunctivae are normal. Pupils are equal, round, and reactive to light.  Neck: Normal range of motion.  Cardiovascular: Regular rhythm and normal heart sounds.   Respiratory: Effort normal. No respiratory distress.  GI: Soft.  Musculoskeletal: Normal range of motion.  Neurological: She is alert.  Skin: Skin is warm and dry.  Psychiatric: Her affect is blunt. Her speech is delayed. She is slowed and withdrawn. Cognition and memory are normal. She expresses impulsivity. She exhibits a depressed mood. She expresses suicidal ideation.    Review of Systems  Constitutional: Negative.   HENT: Negative.   Eyes: Negative.   Respiratory: Negative.   Cardiovascular: Negative.   Gastrointestinal: Positive for constipation.  Musculoskeletal: Negative.   Skin: Negative.   Neurological: Negative.   Psychiatric/Behavioral: Positive for depression, hallucinations and suicidal ideas. Negative for memory loss and substance abuse. The patient is nervous/anxious and has insomnia.     Blood pressure 120/75, pulse 90, temperature 98.1 F (36.7 C), temperature source Oral, resp. rate 18, weight 90.7 kg (200 lb), SpO2 99 %.Body mass index is 32.28 kg/m.  General Appearance: Casual  Eye Contact:  Good  Speech:  Slow  Volume:  Decreased  Mood:  Depressed  Affect:  Flat  Thought Process:  Goal Directed  Orientation:  Full (Time, Place, and Person)  Thought Content:  Focused entirely on being suicidal and needing to be in the hospital  Suicidal Thoughts:  Yes.  with intent/plan  Homicidal Thoughts:  No  Memory:  Immediate;   Good Recent;   Fair Remote;   Fair  Judgement:  Impaired  Insight:  Shallow  Psychomotor Activity:  Decreased  Concentration:  Concentration: Fair  Recall:  [employer] of Knowledge:  Fair  Language:  Fair  Akathisia:  No  Handed:  Right  AIMS (if indicated):     Assets:  Communication Skills Desire for  Improvement Financial Resources/Insurance Housing Resilience Social Support  ADL's:  Impaired  Cognition:  WNL  Sleep:        Treatment Plan Summary: Daily contact with patient to assess and evaluate symptoms and progress in treatment, Medication management and Plan 25 year old woman who comes in the hospital twice in a row swallowing dangerous inanimate objects. Makes it clear that she is still actively suicidal. Makes it clear that she is likely to act out more and continue this behavior. Feels she needs to have changes in her medicine. I'm not sure how much of this is acting out just related to specific reasons to dislike her group home versus her actual symptoms being worse and I'm not sure if it's really possible to tell the difference. At this point discharging her home seems to be a lost cause. We will admit her to the psychiatric unit. Continue current medicine. I've added medicine to help her with her constipation. 15 minute checks. Full set of labs.  Disposition: Recommend psychiatric Inpatient admission when medically cleared. Supportive therapy provided about ongoing stressors.  Alethia Berthold, MD 03/02/2016 3:38 PM

## 2016-03-02 NOTE — ED Notes (Signed)
Pt in room, lying in bed, awake, alert and oriented. Reports she is having thoughts to harm herself due to auditory hallucinations telling her "to hurt myself by swallowing things." Pt verbally contracts for safety, will report desires to act on her thoughts. Safe environment provided with no items present in the milieu that could harm pt if swallowed. Calm and cooperative at this time. Safety maintained with every 15 minute checks and security cameras in place. Will continue to monitor.

## 2016-03-02 NOTE — ED Notes (Signed)
Pt given clean paper scrubs, socks to shower. Provided body wash without the top. Will monitor with toothbrush/toothpaste once out of shower. Calm and cooperative. Safety maintained with every 15 minute checks and security cameras in place. Will continue to monitor.

## 2016-03-02 NOTE — ED Notes (Signed)
Awake and alert in room resting with even, unlabored respirations. Calm and cooperative. Safety maintained with every 15 minute checks and security cameras in place. Will continue to monitor.

## 2016-03-02 NOTE — ED Notes (Signed)
Pt asleep in room with even, unlabored respirations. No acute distress noted. Safety maintained with every 15 minute checks and security cameras in place. Will continue to monitor.

## 2016-03-02 NOTE — ED Notes (Signed)
Food/fluids provided. Pt awake in her room eating lunch. No complaints. No acute distress noted. Calm and cooperative. Safety maintained with every 15 minute checks and security cameras in place. Will continue to monitor.

## 2016-03-02 NOTE — ED Notes (Signed)
Awake and alert in room. Complains of stomach pains, need to have a bowel movement, but cannot. MD informed, medications ordered, awaiting for medications to come from pharmacy. Calm and cooperative. Safety maintained with every 15 minute checks and security cameras in place. Will continue to monitor.

## 2016-03-02 NOTE — ED Notes (Signed)
Pt vomited small amount of undigested food after eating her meal and drinking the mag citrate as ordered. Reports she now feels much better. No acute distress noted. Safety maintained with every 15 minute checks and security cameras. Will continue to monitor.

## 2016-03-02 NOTE — ED Notes (Signed)
Resting in room with even, unlabored respirations noted. No acute distress. Calm and cooperative. Safety maintained with every 15 minute checks and security cameras in place. Will continue to monitor.

## 2016-03-02 NOTE — ED Notes (Signed)
Cooperative with medication administration. Sitting up in bed, awake and alert. No acute distress noted. Safety maintained with every 15 minute checks and security cameras in place. Will continue to monitor.

## 2016-03-02 NOTE — ED Notes (Signed)
Resting in room with even, unlabored respirations noted, no acute distress noted. Calm and cooperative. Safety maintained with every 15 minute checks and security cameras in place. Will continue to monitor.

## 2016-03-03 ENCOUNTER — Inpatient Hospital Stay
Admission: EM | Admit: 2016-03-03 | Discharge: 2016-03-07 | DRG: 885 | Disposition: A | Discharge status: Home / Self Care | Payer: MEDICAID | Source: Intra-hospital | Attending: Psychiatry | Admitting: Psychiatry

## 2016-03-03 DIAGNOSIS — Z91410 Personal history of adult physical and sexual abuse: Secondary | ICD-10-CM

## 2016-03-03 DIAGNOSIS — X58XXXA Exposure to other specified factors, initial encounter: Secondary | ICD-10-CM | POA: Diagnosis present

## 2016-03-03 DIAGNOSIS — Z818 Family history of other mental and behavioral disorders: Secondary | ICD-10-CM

## 2016-03-03 DIAGNOSIS — I10 Essential (primary) hypertension: Secondary | ICD-10-CM | POA: Diagnosis present

## 2016-03-03 DIAGNOSIS — F17210 Nicotine dependence, cigarettes, uncomplicated: Secondary | ICD-10-CM | POA: Diagnosis present

## 2016-03-03 DIAGNOSIS — Z62810 Personal history of physical and sexual abuse in childhood: Secondary | ICD-10-CM | POA: Diagnosis present

## 2016-03-03 DIAGNOSIS — Z888 Allergy status to other drugs, medicaments and biological substances status: Secondary | ICD-10-CM

## 2016-03-03 DIAGNOSIS — F250 Schizoaffective disorder, bipolar type: Principal | ICD-10-CM | POA: Diagnosis present

## 2016-03-03 DIAGNOSIS — D493 Neoplasm of unspecified behavior of breast: Secondary | ICD-10-CM | POA: Diagnosis present

## 2016-03-03 DIAGNOSIS — E039 Hypothyroidism, unspecified: Secondary | ICD-10-CM | POA: Diagnosis present

## 2016-03-03 DIAGNOSIS — K5900 Constipation, unspecified: Secondary | ICD-10-CM | POA: Diagnosis present

## 2016-03-03 DIAGNOSIS — Z91013 Allergy to seafood: Secondary | ICD-10-CM | POA: Diagnosis not present

## 2016-03-03 DIAGNOSIS — F4310 Post-traumatic stress disorder, unspecified: Secondary | ICD-10-CM | POA: Diagnosis present

## 2016-03-03 DIAGNOSIS — G4700 Insomnia, unspecified: Secondary | ICD-10-CM | POA: Diagnosis present

## 2016-03-03 DIAGNOSIS — E785 Hyperlipidemia, unspecified: Secondary | ICD-10-CM | POA: Diagnosis present

## 2016-03-03 DIAGNOSIS — Z8249 Family history of ischemic heart disease and other diseases of the circulatory system: Secondary | ICD-10-CM

## 2016-03-03 DIAGNOSIS — T189XXA Foreign body of alimentary tract, part unspecified, initial encounter: Secondary | ICD-10-CM | POA: Diagnosis present

## 2016-03-03 DIAGNOSIS — K219 Gastro-esophageal reflux disease without esophagitis: Secondary | ICD-10-CM | POA: Diagnosis present

## 2016-03-03 DIAGNOSIS — N6452 Nipple discharge: Secondary | ICD-10-CM

## 2016-03-03 DIAGNOSIS — Z79899 Other long term (current) drug therapy: Secondary | ICD-10-CM

## 2016-03-03 DIAGNOSIS — K56609 Unspecified intestinal obstruction, unspecified as to partial versus complete obstruction: Secondary | ICD-10-CM

## 2016-03-03 LAB — LIPID PANEL
CHOL/HDL RATIO: 4.2 ratio
Cholesterol: 187 mg/dL (ref 0–200)
HDL: 45 mg/dL (ref >40)
LDL Cholesterol: 127 mg/dL — ABNORMAL HIGH (ref 0–99)
TRIGLYCERIDES: 73 mg/dL (ref <150)
VLDL: 15 mg/dL (ref 0–40)

## 2016-03-03 MED ORDER — FLUPHENAZINE HCL 5 MG PO TABS
5.0000 mg | ORAL_TABLET | Freq: Two times a day (BID) | ORAL | Status: DC
  Administered 2016-03-03 – 2016-03-07 (×8): 5 mg via ORAL
  Filled 2016-03-03 (×8): qty 1

## 2016-03-03 MED ORDER — AMLODIPINE BESYLATE 5 MG PO TABS
2.5000 mg | ORAL_TABLET | Freq: Every day | ORAL | Status: DC
  Administered 2016-03-03 – 2016-03-07 (×5): 2.5 mg via ORAL
  Filled 2016-03-03 (×5): qty 1

## 2016-03-03 MED ORDER — LURASIDONE HCL 40 MG PO TABS
120.0000 mg | ORAL_TABLET | Freq: Every evening | ORAL | Status: DC
  Administered 2016-03-03 – 2016-03-06 (×4): 120 mg via ORAL
  Filled 2016-03-03 (×4): qty 3

## 2016-03-03 MED ORDER — POLYETHYLENE GLYCOL 3350 17 GM/SCOOP PO POWD
1.0000 | Freq: Once | ORAL | Status: AC
  Administered 2016-03-03: 255 g via ORAL
  Filled 2016-03-03: qty 255

## 2016-03-03 MED ORDER — DOCUSATE SODIUM 100 MG PO CAPS
200.0000 mg | ORAL_CAPSULE | Freq: Two times a day (BID) | ORAL | Status: DC
  Administered 2016-03-03 – 2016-03-07 (×8): 200 mg via ORAL
  Filled 2016-03-03 (×8): qty 2

## 2016-03-03 MED ORDER — POLYETHYLENE GLYCOL 3350 17 G PO PACK: 17.0000 g | PACK | Freq: Two times a day (BID) | ORAL | Status: DC

## 2016-03-03 MED ORDER — ACETAMINOPHEN 325 MG PO TABS
650.0000 mg | ORAL_TABLET | Freq: Four times a day (QID) | ORAL | Status: DC | PRN
  Administered 2016-03-03 – 2016-03-06 (×3): 650 mg via ORAL
  Filled 2016-03-03 (×3): qty 2

## 2016-03-03 MED ORDER — NICOTINE 21 MG/24HR TD PT24
21.0000 mg | MEDICATED_PATCH | Freq: Every day | TRANSDERMAL | Status: DC
  Administered 2016-03-03 – 2016-03-07 (×5): 21 mg via TRANSDERMAL
  Filled 2016-03-03 (×5): qty 1

## 2016-03-03 MED ORDER — FLUPHENAZINE HCL 5 MG PO TABS
10.0000 mg | ORAL_TABLET | Freq: Every day | ORAL | Status: DC
  Administered 2016-03-03 – 2016-03-06 (×4): 10 mg via ORAL
  Filled 2016-03-03 (×3): qty 2
  Filled 2016-03-03: qty 1
  Filled 2016-03-03: qty 2

## 2016-03-03 MED ORDER — BENZTROPINE MESYLATE 1 MG PO TABS
1.0000 mg | ORAL_TABLET | Freq: Every day | ORAL | Status: DC
  Administered 2016-03-03 – 2016-03-06 (×4): 1 mg via ORAL
  Filled 2016-03-03 (×5): qty 1

## 2016-03-03 MED ORDER — LEVOTHYROXINE SODIUM 75 MCG PO TABS
75.0000 ug | ORAL_TABLET | Freq: Every day | ORAL | Status: DC
  Administered 2016-03-04 – 2016-03-07 (×4): 75 ug via ORAL
  Filled 2016-03-03 (×4): qty 1

## 2016-03-03 MED ORDER — VENLAFAXINE HCL ER 75 MG PO CP24
150.0000 mg | ORAL_CAPSULE | Freq: Every day | ORAL | Status: DC
  Administered 2016-03-04 – 2016-03-07 (×4): 150 mg via ORAL
  Filled 2016-03-03 (×4): qty 2

## 2016-03-03 MED ORDER — TRAZODONE HCL 50 MG PO TABS
50.0000 mg | ORAL_TABLET | Freq: Every day | ORAL | Status: DC
  Administered 2016-03-03 – 2016-03-06 (×4): 50 mg via ORAL
  Filled 2016-03-03 (×5): qty 1

## 2016-03-03 MED ORDER — MAGNESIUM HYDROXIDE 400 MG/5ML PO SUSP: 30.0000 mL | Freq: Every day | ORAL | Status: DC | PRN

## 2016-03-03 MED ORDER — ALUM & MAG HYDROXIDE-SIMETH 200-200-20 MG/5ML PO SUSP: 30.0000 mL | ORAL | Status: DC | PRN

## 2016-03-03 MED ORDER — MAGNESIUM CITRATE PO SOLN: 1.0000 | Freq: Every day | ORAL | Status: DC | PRN

## 2016-03-03 MED ORDER — OXCARBAZEPINE 150 MG PO TABS
150.0000 mg | ORAL_TABLET | Freq: Two times a day (BID) | ORAL | Status: DC
  Administered 2016-03-03 – 2016-03-07 (×8): 150 mg via ORAL
  Filled 2016-03-03 (×8): qty 1

## 2016-03-03 MED ORDER — FLUPHENAZINE HCL 5 MG PO TABS: 5.0000 mg | ORAL_TABLET | ORAL | Status: DC

## 2016-03-03 NOTE — Plan of Care (Signed)
Problem: Safety: Goal: Ability to remain free from injury will improve Outcome: Progressing Patient agreeing to report SI/HI to the nurse

## 2016-03-03 NOTE — [facility] Group Notes (Signed)
[facility] Group Notes:  (Nursing/MHT/Case Management/Adjunct)  Date:  03/03/2016  Time:  4:34 PM  Type of Therapy:  Psychoeducational Skills  Participation Level:  Active  Participation Quality:  Appropriate  Affect:  Appropriate  Cognitive:  Appropriate  Insight:  Appropriate  Engagement in Group:  Engaged  Modes of Intervention:  Discussion and Education  Summary of Progress/Problems:  Melanie Bell 03/03/2016, 4:34 PM

## 2016-03-03 NOTE — Progress Notes (Signed)
Patient currently in the dayroom. Fixated at her mental health behavior " I used to bang my head, I swallowed multiple things..  the doctors are not doing anything about it, somebody needs to do something...". Flat but frequently complaining of multiple  issues "nobody asked me about my constipation..". Patient is otherwise alert and oriented. Denying suicidal and homicidal thoughts. Safety precautions reinforced.

## 2016-03-03 NOTE — ED Notes (Signed)
BEHAVIORAL HEALTH ROUNDING Patient sleeping: Yes.   Patient alert and oriented: not applicable SLEEPING Behavior appropriate: Yes.  ; If no, describe: SLEEPING Nutrition and fluids offered: No SLEEPING Toileting and hygiene offered: NoSLEEPING Sitter present: not applicable, Q 15 min safety rounds and observation via security camera. Law enforcement present: Yes ODS 

## 2016-03-03 NOTE — [facility] Group Notes (Signed)
[facility] Group Notes:  (Nursing/MHT/Case Management/Adjunct)  Date:  03/03/2016  Time:  10:15 PM  Type of Therapy:  Psychoeducational Skills  Participation Level:  Active  Participation Quality:  Appropriate  Affect:  Appropriate  Cognitive:  Alert  Insight:  Good  Engagement in Group:  Engaged  Modes of Intervention:  Support  Summary of Progress/Problems:  Melanie Bell 03/03/2016, 10:15 PM

## 2016-03-03 NOTE — [facility] Suicide Risk Assessment (Signed)
[facility] Admission Suicide Risk Assessment   Nursing information obtained from:    Demographic factors:    Current Mental Status:    Loss Factors:    Historical Factors:    Risk Reduction Factors:     Total Time spent with patient: 1 hour Principal Problem: Schizoaffective disorder, bipolar type ([place]) Diagnosis:   Patient Active Problem List   Diagnosis Date Noted  . Foreign body ingestion(chronic issue) [id number]] 07/31/2015  . Schizoaffective disorder, bipolar type ([place]) [F25.0] 11/06/2014  . Tobacco use disorder [F17.200] 09/30/2014  . Hypothyroidism [E03.9] 09/29/2014  . Hypertension [I10] 09/29/2014  . Constipation [K59.00] 09/29/2014   Subjective Data:   Continued Clinical Symptoms:  Alcohol Use Disorder Identification Test Final Score (AUDIT): 0 The "Alcohol Use Disorders Identification Test", Guidelines for Use in Primary Care, Second Edition.  World Pharmacologist [facility]). Score between 0-7:  no or low risk or alcohol related problems. Score between 8-15:  moderate risk of alcohol related problems. Score between 16-19:  high risk of alcohol related problems. Score 20 or above:  warrants further diagnostic evaluation for alcohol dependence and treatment.   CLINICAL FACTORS:   Depression:   Impulsivity Schizophrenia:   Command hallucinatons More than one psychiatric diagnosis Previous Psychiatric Diagnoses and Treatments     Psychiatric Specialty Exam: Physical Exam  ROS  Blood pressure (!) 112/95, pulse 76, temperature 98.5 F (36.9 C), temperature source Oral, resp. rate 16, height 5\' 6"  (1.676 m), weight 89.8 kg (198 lb), SpO2 99 %.Body mass index is 31.96 kg/m.                                                    Sleep:  Number of Hours: 3      COGNITIVE FEATURES THAT CONTRIBUTE TO RISK:  Closed-mindedness    SUICIDE RISK:   Moderate:  Frequent suicidal ideation with limited intensity, and duration, some specificity in terms of  plans, no associated intent, good self-control, limited dysphoria/symptomatology, some risk factors present, and identifiable protective factors, including available and accessible social support.   PLAN OF CARE: admit to [facility]  I certify that inpatient services furnished can reasonably be expected to improve the patient's condition.  [name], MD 03/03/2016, 2:02 PM
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
## 2016-03-03 NOTE — ED Notes (Signed)
BEHAVIORAL HEALTH ROUNDING  Patient sleeping: No.  Patient alert and oriented: yes  Behavior appropriate: Yes. ; If no, describe:  Nutrition and fluids offered: Yes  Toileting and hygiene offered: Yes  Sitter present: not applicable, Q 15 min safety rounds and observation via security camera. Law enforcement present: Yes ODS  Report called to [place], Therapist, sports in [facility] [employer].

## 2016-03-03 NOTE — Progress Notes (Signed)
Recreation Therapy Notes  Date: 10.19.17 Time: 9:30 am Location: Craft Room  Group Topic: Leisure Education  Goal Area(s) Addresses:  Patient will write at least one healthy coping skill. Patient will identify positive emotions.  Behavioral Response: Attentive  Intervention: Leisure Time Clock  Activity: Patients were instructed to write 2 healthy leisure activities. Patients were then given a Leisure Time Clock worksheet and instructed to complete it. Patients were instructed to identify 10 positive emotions. Patients were instructed to match the healthy leisure activities with the positive emotions.  Education: LRT educated patients on what they need to participate in leisure.  Education Outcome: Acknowledges education/In group clarification offered  Clinical Observations/Feedback: Patient wrote two healthy leisure activities. Patient completed the Leisure Time Clock worksheet. Patient did not contribute to group discussion.  Leonette Monarch, LRT/CTRS 03/03/2016 10:33 AM

## 2016-03-03 NOTE — Progress Notes (Signed)
EKG performed. Results given to RN to place on chart.
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
## 2016-03-03 NOTE — Progress Notes (Signed)
D: Patient very quiet  This am . Limit conversation from patient regarding the tack that she ingested .  Patient stated slept good last night .Stated appetite is poor and energy level  low. Stated concentration is poor . Stated on Depression scale , 10 hopeless 10 and anxiety 10 .( low 0-10 high) Denies suicidal  homicidal ideations  .  No auditory hallucinations  No pain concerns . Appropriate ADL'S. Interacting with peers and staff.  A: Encourage patient participation with unit programming . Instruction  Given on  Medication , verbalize understanding. R: Voice no other concerns. Staff continue to monitor

## 2016-03-03 NOTE — Progress Notes (Signed)
Patient has remained in bed sleeping. Refused to fully talk to the nurse. No sign of distress noted. Safety precautions maintained

## 2016-03-03 NOTE — Tx Team (Signed)
Initial Treatment Plan 03/03/2016 5:45 AM [name] [id number]    PATIENT STRESSORS: Traumatic event Other: "Living situation"   PATIENT STRENGTHS: Ability for insight Communication skills Physical Health   PATIENT IDENTIFIED PROBLEMS: Suicidal Ideation  Swallowed a tack and facial astringent  "Change my living situation"  "work on my depression"               DISCHARGE CRITERIA:  Improved stabilization in mood, thinking, and/or behavior Motivation to continue treatment in a less acute level of care  PRELIMINARY DISCHARGE PLAN: Outpatient therapy  PATIENT/FAMILY INVOLVEMENT: This treatment plan has been presented to and reviewed with the patient, NATAYSHA BUFFKIN.  The patient and family have been given the opportunity to ask questions and make suggestions.  Caryl Pina, RN 03/03/2016, 5:45 AM

## 2016-03-03 NOTE — Progress Notes (Signed)
Called about EKG, was asked to wait until 0630 when pt gets up

## 2016-03-03 NOTE — Progress Notes (Signed)
Admission Note:  Upon arrival to the unit patient's skin was assessed, appears warm and dry apart from a little self inflicted puncture wound on patient right leg. The site appears dry, no drainage noted, In  addition, patient was  searched and no contraband found.

## 2016-03-04 ENCOUNTER — Inpatient Hospital Stay: Payer: MEDICAID

## 2016-03-04 LAB — HEMOGLOBIN A1C
Hgb A1c MFr Bld: 5.2 % (ref 4.8–5.6)
Mean Plasma Glucose: 103 mg/dL

## 2016-03-04 IMAGING — CR DG ABDOMEN 2V
1 series · 3 of 3 positions shown · non-contrast
Comparison: Radiographs [DATE].

CLINICAL DATA: Small bowel obstruction.

EXAM:
ABDOMEN - 2 VIEW

[Series 1: dg abd 2 views · 0.14mm/px · 3 of 3 slices shown]
[im 1/3]
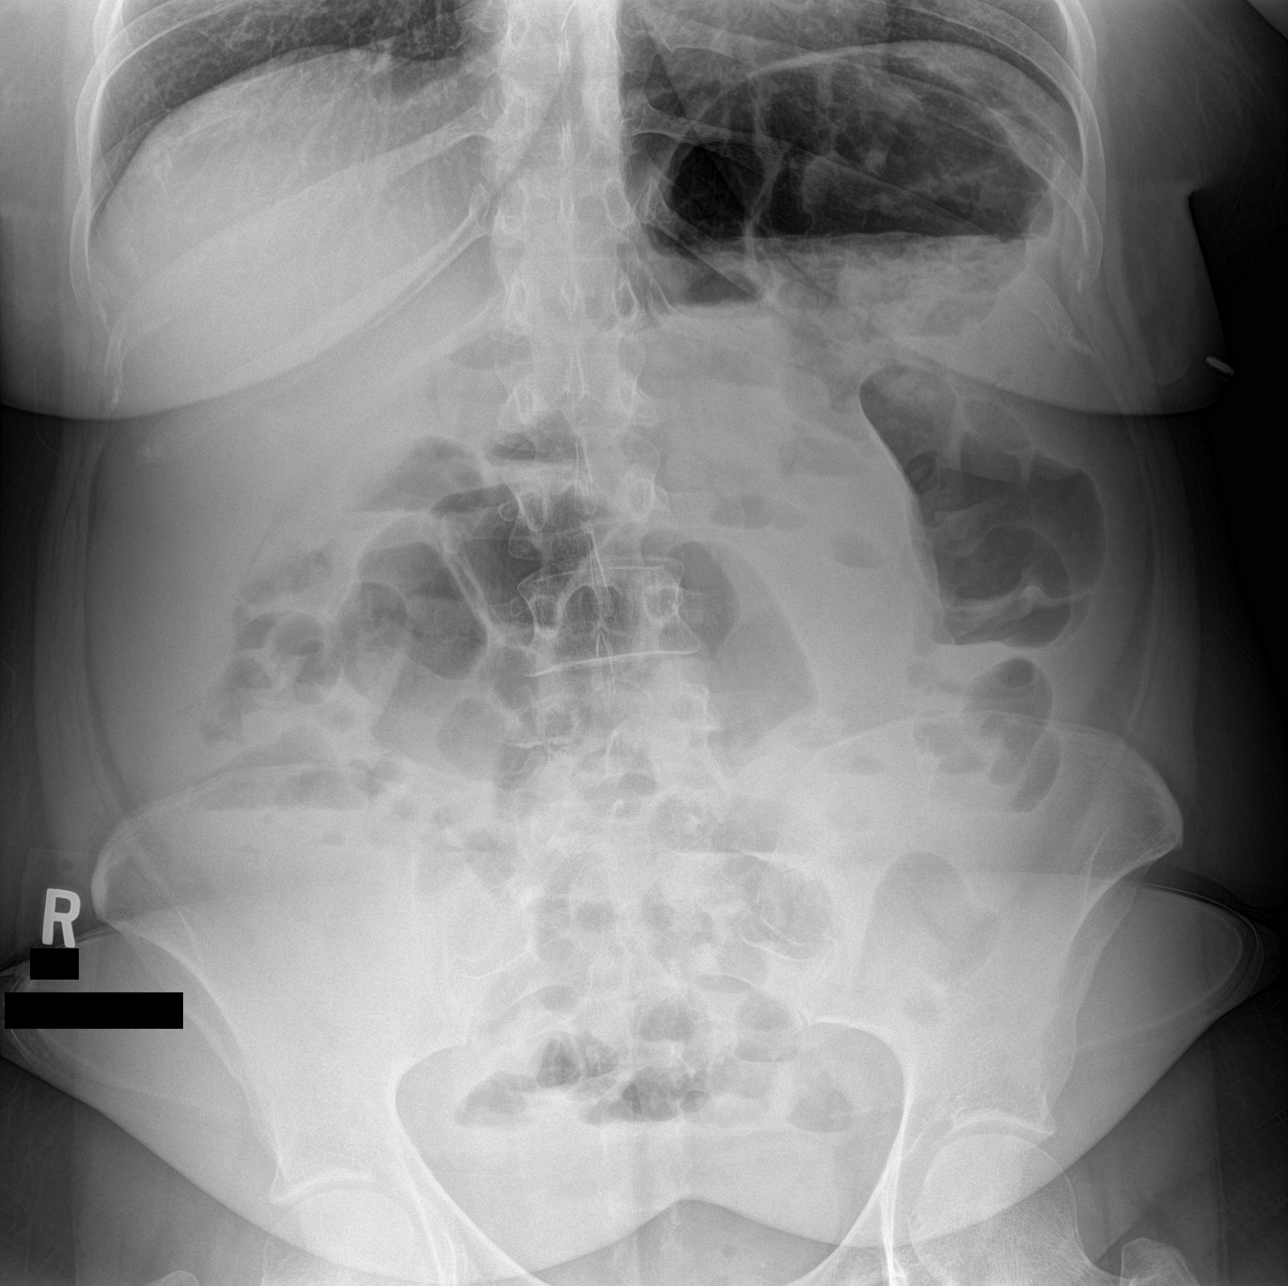
[im 2/3]
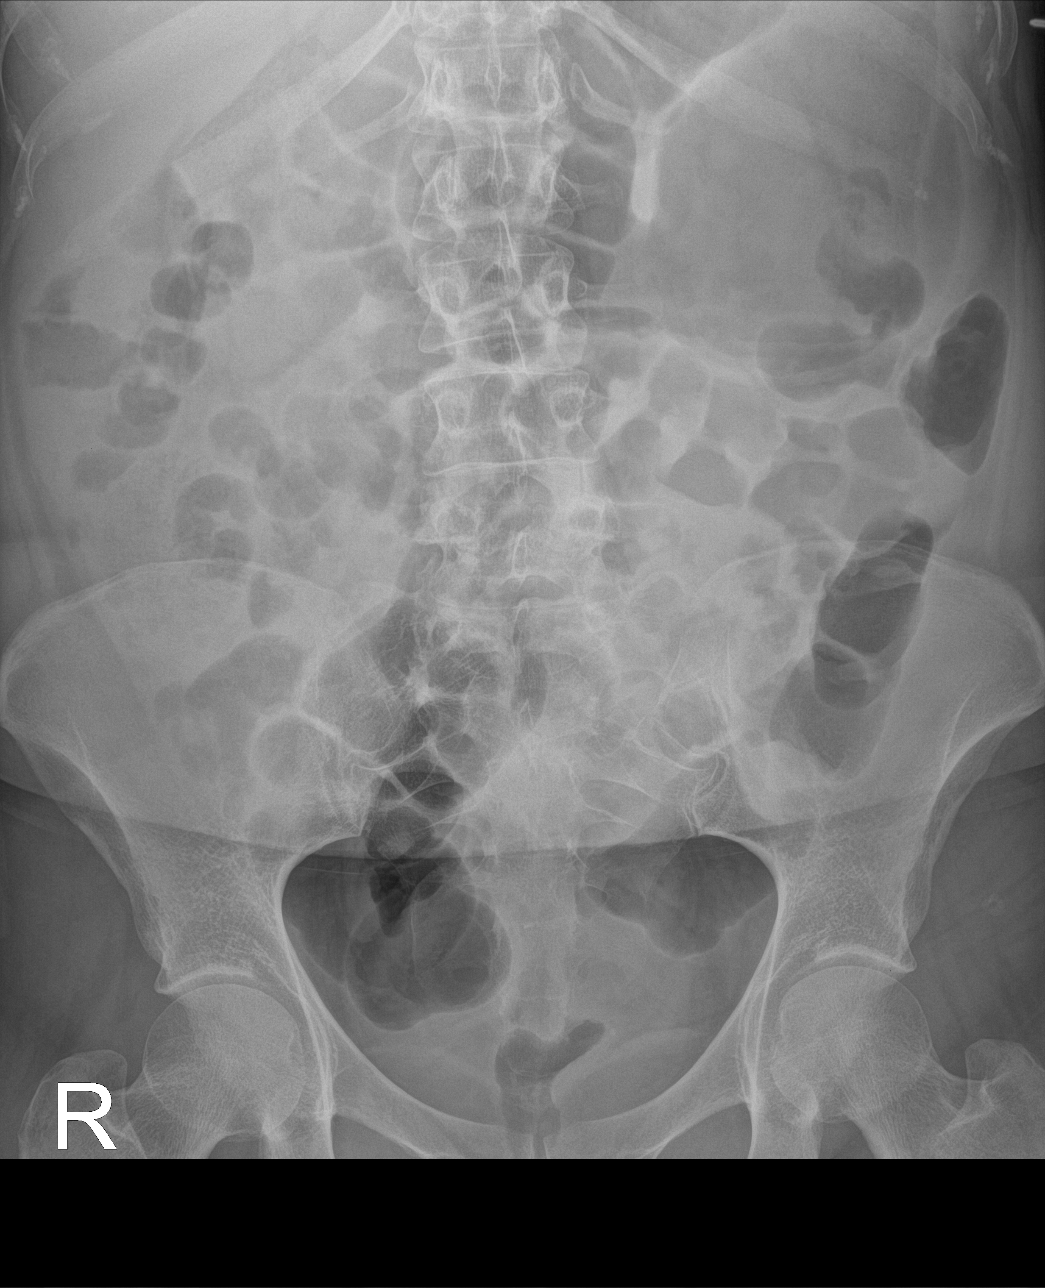
[im 3/3]
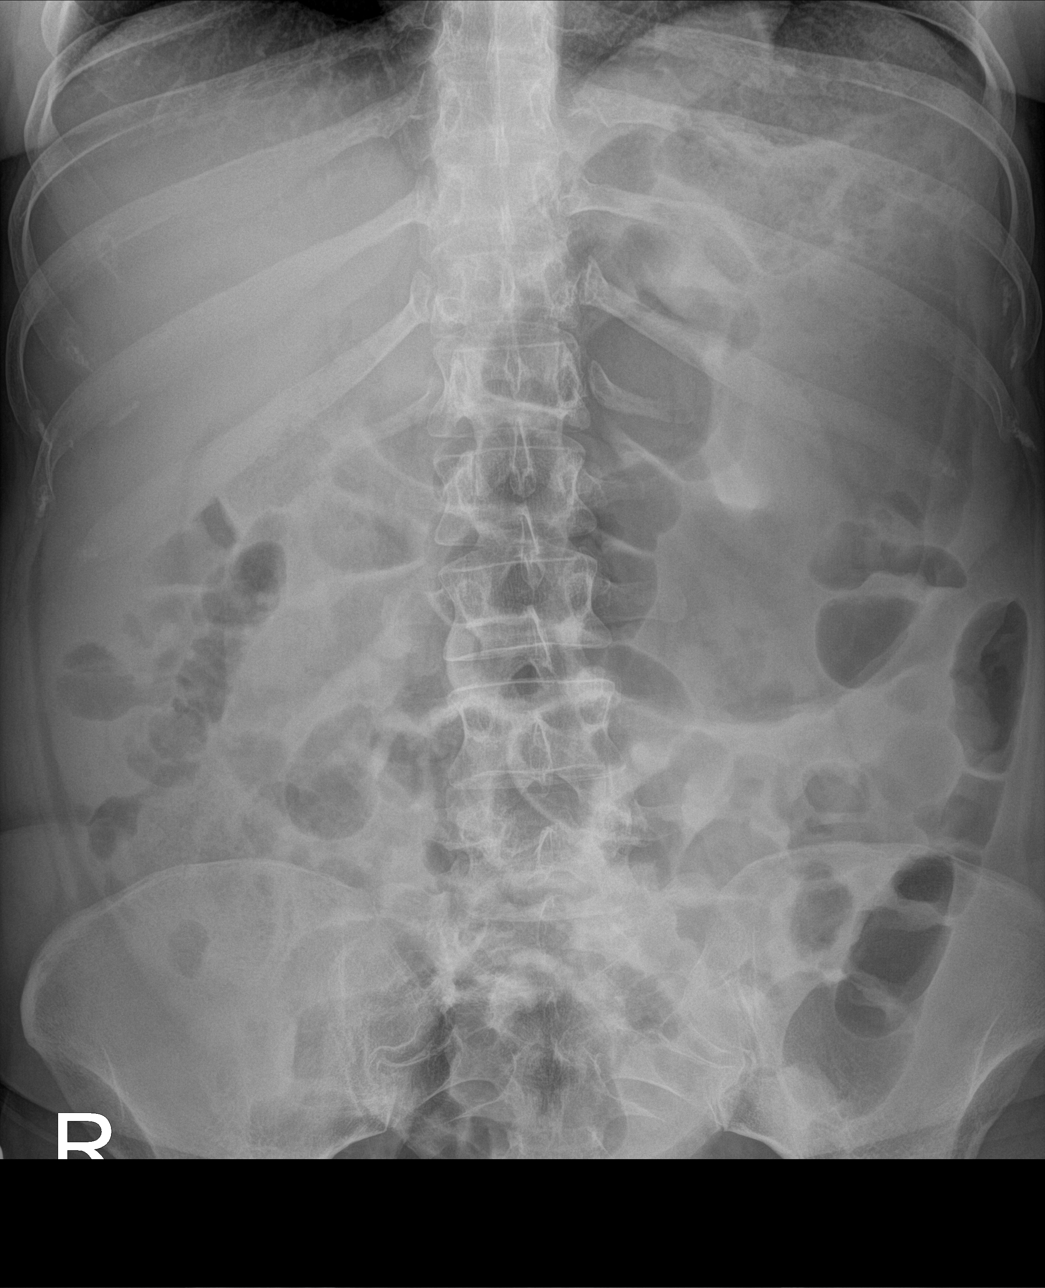

[3 of 3 positions shown; findings below may reference images not displayed]

FINDINGS: No abnormal bowel gas pattern is noted. The linear metallic foreign
body noted on prior exam is not currently visualized. No abnormal
calcifications are noted. No free air is noted.
IMPRESSION: Radiopaque foreign body noted on prior exam is no longer visualized.
No definite evidence of bowel obstruction or ileus is noted.

## 2016-03-04 MED ORDER — POLYETHYLENE GLYCOL 3350 17 GM/SCOOP PO POWD
1.0000 | Freq: Once | ORAL | Status: AC
  Administered 2016-03-04: 255 g via ORAL
  Filled 2016-03-04: qty 255

## 2016-03-04 NOTE — [facility] Group Notes (Signed)
[place] LCSW Group Therapy   03/04/2016 9:30 AM   Type of Therapy: Group Therapy   Participation Level: Active   Participation Quality: Attentive, Sharing and Supportive   Affect: Appropriate   Cognitive: Alert and Oriented   Insight: Developing/Improving and Engaged   Engagement in Therapy: Developing/Improving and Engaged   Modes of Intervention: Clarification, Confrontation, Discussion, Education, Exploration, Limit-setting, Orientation, Problem-solving, Rapport Building, Art therapist, Socialization and Support   Summary of Progress/Problems: The topic for today was feelings about relapse. Pt discussed what relapse prevention is to them and identified triggers that they are on the path to relapse. Pt processed their feeling towards relapse and was able to relate to peers. Pt discussed coping skills that can be used for relapse prevention.  Pt shared that for the pt relapse meant "going back to old habits".  Pt shared that for the pt a barrier to relapse might be swallowing things and being healthy.   [name]. Morton Simson, MSW, LCSWA, LCAS

## 2016-03-04 NOTE — Progress Notes (Signed)
Patient attended group and had a snack. Presented to the medication room, had medications and returned to the dayroom. Patient was intrusive and somatic. Listing multiple mental disorders that she has had. Emotional support and encouragements offered. 0600: patient went to bed and fell asleep. Remained asleep throughout the night. No sign of discomfort. Safety and security maintained on the unit.

## 2016-03-04 NOTE — Progress Notes (Signed)
Recreation Therapy Notes  Date: 10.20.17 Time: 9:30 am Location: Craft Room  Group Topic: Coping Skills  Goal Area(s) Addresses:  Patient will participate in healthy coping skill. Patient will verbalize benefit of using art as a coping skill.  Behavioral Response: Attentive, Left early  Intervention: Coloring  Activity: Patients were given coloring sheets to color and were instructed to think about the emotions they were feeling and what their minds were focused on.  Education: LRT educated patients on healthy coping skills.  Education Outcome: Patient left before LRT educated group.   Clinical Observations/Feedback: Patient colored coloring sheet. Patient left group at approximately 10:07 am with PA student. Patient did not return to group.  Leonette Monarch, LRT/CTRS 03/04/2016 10:24 AM
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
## 2016-03-04 NOTE — Plan of Care (Signed)
Problem: Coping: Goal: Ability to cope will improve Outcome: Progressing Denies suicidal ideations.

## 2016-03-04 NOTE — Plan of Care (Signed)
Problem: Health Behavior/Discharge Planning: Goal: Compliance with therapeutic regimen will improve Outcome: Progressing Patient is getting involved in activities, compliant with medications.

## 2016-03-04 NOTE — Plan of Care (Signed)
Problem: [facility] Participation in Recreation Therapeutic Interventions Goal: STG-Patient will demonstrate improved self esteem by identif STG: Self-Esteem - Within 4 treatment sessions, patient will verbalize at least 5 positive affirmation statements in each of 2 treatment sessions to increase self-esteem post d/c.  Outcome: Progressing Treatment Session 1; Completed 1 out of 2: At approximately 3:10 pm, LRT met with patient in back consultation room. Patient verbalized 5 positive affirmation statements. Patient reported it felt "good". LRT encouraged patient to continue saying positive affirmation statements.  Leonette Monarch, LRT/CTRS 10.20.17 3:32 pm Goal: STG-Other Recreation Therapy Goal (Specify) STG: Stress Management - Within 4 treatment sessions, patient will verbalize understanding of the stress management techniques in each of 2 treatment sessions to increase stress management skills post d/c.  Outcome: Progressing Treatment Session 1; Completed 1 out of 2: At approximately 3:10 pm, LRT met with patient in back consultation room. LRT educated and provided patient with handouts on stress management techniques. Patient verbalized understanding. LRT encouraged patient to read over and practice the stress management techniques.  Leonette Monarch, LRT/CTRS 10.20.17 3:33 pm

## 2016-03-04 NOTE — Plan of Care (Signed)
Problem: Safety: Goal: Ability to remain free from injury will improve Outcome: Progressing Patient's safety maintained on the unit.

## 2016-03-04 NOTE — Plan of Care (Signed)
Problem: Coping: Goal: Ability to verbalize feelings will improve Outcome: Progressing Patient was able to express her nutrition needs and concerns

## 2016-03-04 NOTE — Tx Team (Signed)
Interdisciplinary Treatment and Diagnostic Plan Update  03/04/2016 Time of Session: 10:56am *[name] MRN: [name]  Principal Diagnosis: Schizoaffective disorder, bipolar type [facility])  Secondary Diagnoses: Principal Problem:   Schizoaffective disorder, bipolar type ([place]) Active Problems:   Hypothyroidism   Hypertension   Constipation   Tobacco use disorder   Foreign body ingestion(chronic issue)   Current Medications:  Current Facility-Administered Medications  Medication Dose Route Frequency Provider Last Rate Last Dose  . acetaminophen (TYLENOL) tablet 650 mg  650 mg Oral Q6H PRN [name], MD   650 mg at 03/03/16 2148  . alum & mag hydroxide-simeth (MAALOX/MYLANTA) 200-200-20 MG/5ML suspension 30 mL  30 mL Oral Q4H PRN [name], MD      . amLODipine (NORVASC) tablet 2.5 mg  2.5 mg Oral Daily [name], MD   2.5 mg at 03/04/16 0830  . benztropine (COGENTIN) tablet 1 mg  1 mg Oral QHS [name], MD   1 mg at 03/03/16 2149  . docusate sodium (COLACE) capsule 200 mg  200 mg Oral BID [name], MD   200 mg at 03/04/16 0830  . fluPHENAZine (PROLIXIN) tablet 10 mg  10 mg Oral QHS [name], MD   10 mg at 03/03/16 2149  . fluPHENAZine (PROLIXIN) tablet 5 mg  5 mg Oral BID [name], MD   5 mg at 03/04/16 0830  . levothyroxine (SYNTHROID, LEVOTHROID) tablet 75 mcg  75 mcg Oral QAC breakfast [name], MD   75 mcg at 03/04/16 [phone]  . lurasidone (LATUDA) tablet 120 mg  120 mg Oral QPM [name], MD   120 mg at 03/03/16 1609  . nicotine (NICODERM CQ - dosed in mg/24 hours) patch 21 mg  21 mg Transdermal Daily [name], MD   21 mg at 03/04/16 0829  . OXcarbazepine (TRILEPTAL) tablet 150 mg  150 mg Oral BID [name], MD   150 mg at 03/04/16 0831  . polyethylene glycol powder (GLYCOLAX/MIRALAX) container 255 g  1 Container Oral Once  [name], MD      . traZODone (DESYREL) tablet 50 mg  50 mg Oral QHS [name], MD   50 mg at 03/03/16 2148  . venlafaxine XR (EFFEXOR-XR) 24 hr capsule 150 mg  150 mg Oral Q breakfast [name], MD   150 mg at 03/04/16 0830   PTA Medications: Prescriptions Prior to Admission  Medication Sig Dispense Refill Last Dose  . amLODipine (NORVASC) 2.5 MG tablet Take 1 tablet (2.5 mg total) by mouth daily. 30 tablet 0 unknown at unknown  . benztropine (COGENTIN) 1 MG tablet Take 1 mg by mouth at bedtime.   unknown at unknown  . fluPHENAZine (PROLIXIN) 5 MG tablet Take 5-10 mg by mouth See admin instructions. Take 5 mg by mouth in the morning, take 5 mg by mouth in the afternoon and take 10 mg by mouth at bedtime.   unknown at unknown  . hydrOXYzine (VISTARIL) 50 MG capsule Take 50 mg by mouth 2 (two) times daily as needed for anxiety.   unknown at unknown  . levothyroxine (SYNTHROID, LEVOTHROID) 75 MCG tablet Take 1 tablet (75 mcg total) by mouth daily before breakfast. 30 tablet 0 unknown at unknown  . Lurasidone HCl (LATUDA) 120 MG TABS Take 120 mg by mouth every evening.   unknown at unknown  . OXcarbazepine (TRILEPTAL) 150 MG tablet Take 1 tablet (150 mg total) by mouth 2 (two) times daily. 60 tablet 0 unknown at unknown  .  REGLAN 5 MG tablet Take 1 tablet (5 mg total) by mouth 3 (three) times daily. 3 tablet 0   . traZODone (DESYREL) 50 MG tablet Take 50 mg by mouth at bedtime.   unknown at unknown  . venlafaxine XR (EFFEXOR-XR) 150 MG 24 hr capsule Take 1 capsule (150 mg total) by mouth daily with breakfast. 30 capsule 0 unknown at unknown    Patient Stressors: Traumatic event Other: "Living situation"  Patient Strengths: Ability for insight Communication skills Physical Health  Treatment Modalities: Medication Management, Group therapy, Case management,  1 to 1 session with clinician, Psychoeducation, Recreational therapy.   Physician  Treatment Plan for Primary Diagnosis: Schizoaffective disorder, bipolar type ([place]) Long Term Goal(s): Improvement in symptoms so as ready for discharge Improvement in symptoms so as ready for discharge   Short Term Goals: Ability to identify changes in lifestyle to reduce recurrence of condition will improve Ability to verbalize feelings will improve Ability to disclose and discuss suicidal ideas Ability to demonstrate self-control will improve Ability to identify and develop effective coping behaviors will improve Compliance with prescribed medications will improve Ability to identify triggers associated with substance abuse/mental health issues will improve Ability to verbalize feelings will improve Ability to identify and develop effective coping behaviors will improve Ability to identify triggers associated with substance abuse/mental health issues will improve  Medication Management: Evaluate patient's response, side effects, and tolerance of medication regimen.  Therapeutic Interventions: 1 to 1 sessions, Unit Group sessions and Medication administration.  Evaluation of Outcomes: Progressing  Physician Treatment Plan for Secondary Diagnosis: Principal Problem:   Schizoaffective disorder, bipolar type ([place]) Active Problems:   Hypothyroidism   Hypertension   Constipation   Tobacco use disorder   Foreign body ingestion(chronic issue)  Long Term Goal(s): Improvement in symptoms so as ready for discharge Improvement in symptoms so as ready for discharge   Short Term Goals: Ability to identify changes in lifestyle to reduce recurrence of condition will improve Ability to verbalize feelings will improve Ability to disclose and discuss suicidal ideas Ability to demonstrate self-control will improve Ability to identify and develop effective coping behaviors will improve Compliance with prescribed medications will improve Ability to identify triggers associated with substance  abuse/mental health issues will improve Ability to verbalize feelings will improve Ability to identify and develop effective coping behaviors will improve Ability to identify triggers associated with substance abuse/mental health issues will improve     Medication Management: Evaluate patient's response, side effects, and tolerance of medication regimen.  Therapeutic Interventions: 1 to 1 sessions, Unit Group sessions and Medication administration.  Evaluation of Outcomes: Progressing   RN Treatment Plan for Primary Diagnosis: Schizoaffective disorder, bipolar type ([place]) Long Term Goal(s): Knowledge of disease and therapeutic regimen to maintain health will improve  Short Term Goals: Ability to remain free from injury will improve, Ability to demonstrate self-control, Ability to disclose and discuss suicidal ideas and Ability to identify and develop effective coping behaviors will improve  Medication Management: RN will administer medications as ordered by provider, will assess and evaluate patient's response and provide education to patient for prescribed medication. RN will report any adverse and/or side effects to prescribing provider.  Therapeutic Interventions: 1 on 1 counseling sessions, Psychoeducation, Medication administration, Evaluate responses to treatment, Monitor vital signs and CBGs as ordered, Perform/monitor CIWA, COWS, AIMS and Fall Risk screenings as ordered, Perform wound care treatments as ordered.  Evaluation of Outcomes: Progressing   LCSW Treatment Plan for Primary Diagnosis: Schizoaffective disorder, bipolar type (  [place]) Long Term Goal(s): Safe transition to appropriate next level of care at discharge, Engage patient in therapeutic group addressing interpersonal concerns.  Short Term Goals: Engage patient in aftercare planning with referrals and resources, Increase ability to appropriately verbalize feelings, Increase emotional regulation, Facilitate acceptance of  mental health diagnosis and concerns, Identify triggers associated with mental health/substance abuse issues and Increase skills for wellness and recovery  Therapeutic Interventions: Assess for all discharge needs, 1 to 1 time with Social worker, Explore available resources and support systems, Assess for adequacy in community support network, Educate family and significant other(s) on suicide prevention, Complete Psychosocial Assessment, Interpersonal group therapy.  Evaluation of Outcomes: Progressing   Progress in Treatment: Attending groups: Yes. Participating in groups: Yes. Taking medication as prescribed: Yes. Toleration medication: Yes. Family/Significant other contact made: No, will contact:  guardian Patient understands diagnosis: Yes. Discussing patient identified problems/goals with staff: Yes. Medical problems stabilized or resolved: No. Denies suicidal/homicidal ideation: Yes. Issues/concerns per patient self-inventory: No. Other: n/a  New problem(s) identified: None identified at this time.  New Short Term/Long Term Goal(s): None identified at this time.   Discharge Plan or Barriers: Patient will discharge back to group home and follow-up with her ACTT team with [facility].   Reason for Continuation of Hospitalization: Medical Issues Medication stabilization  Estimated Length of Stay: 5 to 7 days.   Attendees: Patient:Melanie Bell 03/04/2016 10:56 AM  Physician: Dr. Merlyn AlbertPatsey Berthold, MD 03/04/2016 10:56 AM  Nursing: Elige Radon, RN 03/04/2016 10:56 AM  RN Care Manager: 03/04/2016 10:56 AM  Social Worker: Lear Ng. Claybon Jabs MSW, [place] 03/04/2016 10:56 AM  Recreational Therapist: Leonette Monarch, LRT/CTRS 03/04/2016 10:56 AM  Other:  03/04/2016 10:56 AM  Other:  03/04/2016 10:56 AM  Other: 03/04/2016 10:56 AM    Scribe for Treatment Team: Jolaine Click, LCSWA 03/04/2016 11:27 AM

## 2016-03-04 NOTE — Progress Notes (Signed)
Patient rated her depression & anxiety 8/10.Denies suicidal or homicidal ideations & AV hallucinations.Patient is visible in the milieu.Stated that she is constipated for a week.Patient finished with her golightly.Compliant with medications.Attended groups.Support & encouragement given.

## 2016-03-05 ENCOUNTER — Encounter: Payer: Self-pay | Admitting: Gastroenterology

## 2016-03-05 DIAGNOSIS — D493 Neoplasm of unspecified behavior of breast: Secondary | ICD-10-CM | POA: Diagnosis present

## 2016-03-05 DIAGNOSIS — F250 Schizoaffective disorder, bipolar type: Principal | ICD-10-CM

## 2016-03-05 LAB — PROLACTIN: PROLACTIN: 64.6 ng/mL — AB (ref 4.8–23.3)

## 2016-03-05 NOTE — Plan of Care (Signed)
Problem: Bowel/Gastric: Goal: Will not experience complications related to bowel motility Outcome: Progressing Pt reports multiple bowel movements today.

## 2016-03-05 NOTE — Progress Notes (Signed)
Pt awake, alert, up on unit today. Interacts appropriately with peers. Reports several bowel movements today. Denies SI/HI/AVH. Pleasant and cooperative, reporting improvement in her day today compared to days before. Medication complaint-pt able to verbalize medications and their uses. Rates depression 3/10, hopelessness 2/10, anxiety 6/10 (low0-10high). Reports good sleep, good appetite, normal energy, and good concentration today.   Safety maintained with every 15 minute checks. Support and encouragement provided. Medications administered as ordered with education. Will continue to monitor.

## 2016-03-05 NOTE — [facility] Suicide Risk Assessment (Signed)
[place] INPATIENT:  Family/Significant Other Suicide Prevention Education  Suicide Prevention Education:  Education Completed; Melanie Bell (mother/guardian [phone]), has been identified by the patient as the family member/significant other with whom the patient will be residing, and identified as the person(s) who will aid the patient in the event of a mental health crisis (suicidal ideations/suicide attempt).  With written consent from the patient, the family member/significant other has been provided the following suicide prevention education, prior to the and/or following the discharge of the patient. Patients mother is currently is hospital. This is why staff have not been able to reach her until today. When mother is discharged from the hospital she will be going to nursing home. CSW informed mother that patient was given 30 day notice from her current group home and that when discharged she will be returning to her current group home for the remaining days of her notice and working with her ACTT team to find another group home. Mother was agreeable to this plan. Mother provided an email address to scan consents and ROI's to be returned with her signature as soon as possible. Mother has no further questions for CSW at this time.   The suicide prevention education provided includes the following:  Suicide risk factors  Suicide prevention and interventions  National Suicide Hotline telephone number  [facility] assessment telephone number  [facility] Emergency Assistance [place] and/or Residential Mobile Crisis Unit telephone number  Request made of family/significant other to:  Remove weapons (e.g., guns, rifles, knives), all items previously/currently identified as safety concern.    Remove drugs/medications (over-the-counter, prescriptions, illicit drugs), all items previously/currently identified as a safety concern.  The family member/significant other  verbalizes understanding of the suicide prevention education information provided.  The family member/significant other agrees to remove the items of safety concern listed above.  Melanie Bell G. [place], [place] 03/05/2016, 2:58 PM
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
## 2016-03-05 NOTE — Progress Notes (Signed)
Patient ID: Melanie [facility], female   DOB: July 27, 1990, 25 y.o.   MRN: [id number]   Patients mother is currently in hospital. This is why staff have not been able to reach her until today. When mother is discharged from the hospital she will be going to nursing home. CSW informed mother that patient was given 30 day notice from her current group home and that when discharged she will be returning to her current group home for the remaining days of her notice and working with her ACTT team to find another group home. Mother was agreeable to this plan. Mother provided an email address to scan consents and ROI's to be returned to [place] with her signature as soon as possible. Mother has no further questions for CSW at this time.   CSW scanned consent and ROI and emailed to mother at email address [email or web address].   Geriann Lafont G. [place], [place] 03/05/2016 3:00 PM

## 2016-03-05 NOTE — BHH Group Notes (Signed)
[facility] LCSW Group Therapy  03/05/2016 3:16 PM  Type of Therapy:  Group Therapy  Participation Level:  Active  Participation Quality:  Appropriate  Affect:  Appropriate  Cognitive:  Appropriate  Insight:  Developing/Improving  Engagement in Therapy:  Developing/Improving  Modes of Intervention:  Activity, Discussion, Education and Support  Summary of Progress/Problems:Coping Skills: Patients defined and discussed healthy coping skills. Patients identified healthy coping skills they would like to try during hospitalization and after discharge. CSW offered insight to varying coping skills that may have been new to patients such as practicing mindfulness.  Sandie Swayze G. [place], [place] 03/05/2016, 3:16 PM

## 2016-03-05 NOTE — Progress Notes (Signed)
Pt denies SI/HI/AVH. In medication room Pt stated "I haven't heard them for awhile" Pt was referring to [facility]. Pt attended evening group and stated her goal was " to stop swallowing foreign objects". Denies pain. Medication compliant. Voices no additional concerns at this time. Safety maintained. Will continue to monitor.
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
## 2016-03-06 NOTE — Plan of Care (Signed)
Problem: Medication: Goal: Compliance with prescribed medication regimen will improve Outcome: Progressing Pt has been compliant with prescribed medications.

## 2016-03-06 NOTE — [facility] Group Notes (Signed)
[place] LCSW Group Therapy  03/06/2016 3:24 PM  Type of Therapy:  Group Therapy  Participation Level:  Active  Participation Quality:  Appropriate and Sharing  Affect:  Appropriate  Cognitive:  Alert  Insight:  Developing/Improving  Engagement in Therapy:  Developing/Improving  Modes of Intervention:  Discussion, Education and Support  Summary of Progress/Problems:Communications: Patients identify how individuals communicate with one another appropriately and inappropriately. Patients will be guided to discuss their thoughts, feelings, and behaviors related to barriers when communicating. The group will process together ways to execute positive and appropriate communications.   Dianna Deshler G. [place], [place] 03/06/2016, 3:24 PM

## 2016-03-06 NOTE — Progress Notes (Signed)
Pt has been pleasant and coperative. Pt continues to be med compliant. Pt denies SI but continues to endorse having A/hallucinations but states they are not as intense. Pt completed self inventory rating : depression 4/10, hopelessness 1/10 and anxiety 7/10.

## 2016-03-07 MED ORDER — DOCUSATE SODIUM 100 MG PO CAPS: 200.0000 mg | ORAL_CAPSULE | Freq: Two times a day (BID) | ORAL | 0 refills | Status: DC

## 2016-03-07 MED ORDER — POLYETHYLENE GLYCOL 3350 17 G PO PACK: 17.0000 g | PACK | Freq: Every day | ORAL | 0 refills | Status: DC

## 2016-03-07 MED ORDER — AMANTADINE HCL 100 MG PO CAPS: 100.0000 mg | ORAL_CAPSULE | Freq: Two times a day (BID) | ORAL | 0 refills | Status: DC

## 2016-03-07 MED ORDER — AMANTADINE HCL 100 MG PO CAPS
100.0000 mg | ORAL_CAPSULE | Freq: Two times a day (BID) | ORAL | Status: DC
  Administered 2016-03-07: 100 mg via ORAL
  Filled 2016-03-07: qty 1

## 2016-03-07 MED ORDER — POLYETHYLENE GLYCOL 3350 17 G PO PACK
17.0000 g | PACK | Freq: Every day | ORAL | Status: DC
  Administered 2016-03-07: 17 g via ORAL
  Filled 2016-03-07: qty 1

## 2016-03-07 NOTE — Progress Notes (Signed)
D: Pt denies SI/HI/AVH. Pt is pleasant and cooperative, affect is flat but brightens upon approach. Patient's thoughts are logical no bizarre behavior noted. Pt appears less anxious and she is interacting with peers and staff appropriately.  A: Pt was offered support and encouragement. Pt was given scheduled medications. Pt was encouraged to attend groups. Q 15 minute checks were done for safety.  R:Pt did not attend evening group. Pt is taking medication. Pt receptive to treatment and safety maintained on unit.
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
## 2016-03-07 NOTE — NC FL2 (Signed)
[place] LEVEL OF CARE SCREENING TOOL     IDENTIFICATION  Patient [name] Birthdate: May 02, 1991 Sex: female Admission Date (Current Location): 03/03/2016  [place] and [place] Number:  [name] [id number] [place] and Address:  [facility], [address] [place], [place] [address]      Provider Number: [id number]  Attending Physician Name and Address:  Golden Hurter*  Relative Name and Phone Number:  [name] (mother/ legal guardian) [phone]    Current Level of Care: Hospital Recommended Level of Care: [facility] Prior Approval Number:    Date Approved/Denied:   PASRR Number:    Discharge Plan:      Current Diagnoses: Patient Active Problem List   Diagnosis Date Noted  . Breast tumor 03/05/2016  . Foreign body ingestion(chronic issue) 07/31/2015  . Schizoaffective disorder, bipolar type ([place]) 11/06/2014  . Tobacco use disorder 09/30/2014  . Hypothyroidism 09/29/2014  . Hypertension 09/29/2014  . Constipation 09/29/2014    Orientation RESPIRATION BLADDER Height & Weight     Self, Time, Situation, Place  Normal Continent Weight: 198 lb (89.8 kg) Height:  5\' 6"  (167.6 cm)  BEHAVIORAL SYMPTOMS/MOOD NEUROLOGICAL BOWEL NUTRITION STATUS      Continent    AMBULATORY STATUS COMMUNICATION OF NEEDS Skin   Independent Verbally                         Personal Care Assistance Level of Assistance              Functional Limitations Info  Sight, Hearing, Speech Sight Info: Adequate Hearing Info: Adequate Speech Info: Adequate    SPECIAL CARE FACTORS FREQUENCY                       Contractures Contractures Info: Not present    Additional Factors Info  Psychotropic               Current Medications (03/07/2016):  This is the current hospital active medication list Current Facility-Administered Medications  Medication Dose Route Frequency Provider Last Rate  Last Dose  . acetaminophen (TYLENOL) tablet 650 mg  650 mg Oral Q6H PRN [name], MD   650 mg at 03/06/16 1255  . alum & mag hydroxide-simeth (MAALOX/MYLANTA) 200-200-20 MG/5ML suspension 30 mL  30 mL Oral Q4H PRN [name], MD      . amantadine (SYMMETREL) capsule 100 mg  100 mg Oral BID [name], MD   100 mg at 03/07/16 0946  . amLODipine (NORVASC) tablet 2.5 mg  2.5 mg Oral Daily [name], MD   2.5 mg at 03/07/16 0813  . docusate sodium (COLACE) capsule 200 mg  200 mg Oral BID [name], MD   200 mg at 03/07/16 [id number]  . fluPHENAZine (PROLIXIN) tablet 10 mg  10 mg Oral QHS [name], MD   10 mg at 03/06/16 2220  . fluPHENAZine (PROLIXIN) tablet 5 mg  5 mg Oral BID [name], MD   5 mg at 03/07/16 [id number]  . levothyroxine (SYNTHROID, LEVOTHROID) tablet 75 mcg  75 mcg Oral QAC breakfast [name], MD   75 mcg at 03/07/16 0647  . lurasidone (LATUDA) tablet 120 mg  120 mg Oral QPM [name], MD   120 mg at 03/06/16 1641  . nicotine (NICODERM CQ - dosed in mg/24 hours) patch 21 mg  21 mg Transdermal Daily [name], MD   21 mg at 03/07/16  [id number]  . OXcarbazepine (TRILEPTAL) tablet 150 mg  150 mg Oral BID [name], MD   150 mg at 03/07/16 0813  . polyethylene glycol (MIRALAX / GLYCOLAX) packet 17 g  17 g Oral Daily [name], MD   17 g at 03/07/16 0947  . traZODone (DESYREL) tablet 50 mg  50 mg Oral QHS [name], MD   50 mg at 03/06/16 2220  . venlafaxine XR (EFFEXOR-XR) 24 hr capsule 150 mg  150 mg Oral Q breakfast [name], MD   150 mg at 03/07/16 0813     Discharge Medications: Please see discharge summary for a list of discharge medications.  Relevant Imaging Results:  Relevant Lab Results:   Additional Information Allergies: Betadine, Shell fish derived products Iodine  SSN #  [id number]  [name], LCSW

## 2016-03-07 NOTE — Anesthesia Postprocedure Evaluation (Signed)
Anesthesia Post Note  Patient: Melanie Bell  Procedure(s) Performed: Procedure(s) (LRB): ESOPHAGOGASTRODUODENOSCOPY (EGD) WITH PROPOFOL (N/A)  Patient location during evaluation: PACU Anesthesia Type: General Level of consciousness: awake and alert Pain management: pain level controlled Vital Signs Assessment: post-procedure vital signs reviewed and stable Respiratory status: spontaneous breathing, nonlabored ventilation, respiratory function stable and patient connected to nasal cannula oxygen Cardiovascular status: blood pressure returned to baseline and stable Postop Assessment: no signs of nausea or vomiting Anesthetic complications: no    Last Vitals:  Vitals:   02/29/16 2150 02/29/16 2307  BP: 109/63 109/71  Pulse: 67 80  Resp: 18 16  Temp:      Last Pain:  Vitals:   02/29/16 2307  TempSrc:   PainSc: 0-No pain                 Molli Barrows
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
## 2016-03-07 NOTE — Plan of Care (Signed)
Problem: [facility] Participation in Recreation Therapeutic Interventions Goal: STG-Patient will demonstrate improved self esteem by identif STG: Self-Esteem - Within 4 treatment sessions, patient will verbalize at least 5 positive affirmation statements in each of 2 treatment sessions to increase self-esteem post d/c.  Outcome: Completed/Met Date Met: 03/07/16 Treatment Session 2; Completed 2 out of 2: At approximately 11:00 am, LRT met with patient in patient room. Patient verbalized 5 positive affirmation statements. Patient reported it felt "good". LRT encouraged patient to continue saying positive affirmation statements.  Leonette Monarch, LRT/CTRS 10.23.17 11:18 am Goal: STG-Other Recreation Therapy Goal (Specify) STG: Stress Management - Within 4 treatment sessions, patient will verbalize understanding of the stress management techniques in each of 2 treatment sessions to increase stress management skills post d/c.  Outcome: Completed/Met Date Met: 03/07/16 Treatment Session 2; Completed 2 out of 2: At approximately 11:00 am, LRT met with patient in patient room. Patient reported she read over and practiced the stress management techniques. Patient verbalized understanding and reported the techniques were helpful. LRT encouraged patient to continue saying positive affirmation statements.  Leonette Monarch, LRT/CTRS 10.23.17 11:20 am

## 2016-03-07 NOTE — Progress Notes (Signed)
  [facility] Adult Case Management Discharge Plan :  Will you be returning to the same living situation after discharge:  Yes,    At discharge, do you have transportation home?: Yes,    Do you have the ability to pay for your medications: Yes,     Release of information consent forms completed and in the chart;  Patient's signature needed at discharge.  Patient to Follow up at: Follow-up [place] Follow up on 03/07/2016.   Why:  ACTT team will follow up today at group home per staff Elmo Putt. Contact information: [address] [place], [place] [address] Phone: [phone] Fax: [phone]       Casilda Carls, MD. Go on 03/10/2016.   Specialty:  Internal Medicine Why:  Please go to appointment at 9:30 am. Dr. Eilleen Kempf office will call [facility] to confirm Contact information: [place] [address] [phone]           Next level of care provider has access to [place] and Suicide Prevention discussed: Yes,     Have you used any form of tobacco in the last 30 days? (Cigarettes, Smokeless Tobacco, Cigars, and/or Pipes): Yes  Has patient been referred to the Quitline?: Patient refused referral  Patient has been referred for addiction treatment: Yes  Carloyn Jaeger Lizandro Spellman, MSw, LCSW 03/07/2016, 5:21 PM

## 2016-03-07 NOTE — Progress Notes (Signed)
D:Patient aware of discharge this shift . Patient returning home . Patient received all belonging locked up . Patient denies  Suicidal  And homicidal ideations  .  A: Writer instructed on discharge criteria  . Informed  Discharge Summary, Transitional Record, Suicidal Risk Assessment and prescriptions  given to group home staff . Aware  Of follow up appointment . R: Patient left unit with no questions  Or concerns  With group home staff.

## 2016-03-07 NOTE — Progress Notes (Signed)
Recreation Therapy Notes  INPATIENT RECREATION TR PLAN  Patient Details [name] MRN: [id number] DOB: [birth year] Today's Date: 03/07/2016  Rec Therapy Plan Is patient appropriate for Therapeutic Recreation?: Yes Treatment times per week: At least once a week TR Treatment/Interventions: 1:1 session, Group participation (Comment) (Appropriate participation in daily recreational therapy tx)  Discharge Criteria Pt will be discharged from therapy if:: Treatment goals are met, Discharged Treatment plan/goals/alternatives discussed and agreed upon by:: Patient/family  Discharge Summary Short term goals set: See Care Plan Short term goals met: Complete Progress toward goals comments: One-to-one attended Which groups?: Coping skills, Leisure education, Other (Comment) (Self-expression) One-to-one attended: Self-esteem, stress management Reason goals not met: N/A Therapeutic equipment acquired: None Reason patient discharged from therapy: Discharge from hospital Pt/family agrees with progress & goals achieved: Yes Date patient discharged from therapy: 03/07/16   Leonette Monarch, LRT/CTRS 03/07/2016, 2:01 PM

## 2016-03-07 NOTE — Progress Notes (Signed)
Recreation Therapy Notes  Date: 10.23.17 Time: 9:30 am Location: Craft Room  Group Topic: Self-expression  Goal Area(s) Addresses:  Patient will effectively use art as a means of self-expression. Patient will recognize positive benefit of self-expression. Patient will be able to identify one emotion experienced during group. Patient will identify use of art as a coping skill.  Behavioral Response: Attentive, Interactive  Intervention: Two Faces of Me  Activity: Patients were given blank face worksheet and were instructed to draw a line down the middle. On one side they were instructed to draw or write how they felt when they were admitted to the hospital, and on the other side they were instructed to draw or write how they want to feel when they are d/c.  Education: LRT educated patients on different forms of self-expression.  Education Outcome: Acknowledges education/In group clarification offered   Clinical Observations/Feedback: Patient completed activity by drawing how she felt when she was admitted and how she wants to feel when she is d/c. Patient contributed to group discussion by stating what she can do to maintain being positive and why it is important to express her emotions.  Leonette Monarch, LRT/CTRS 03/07/2016 10:28 AM

## 2016-03-07 NOTE — BHH Group Notes (Signed)
[place] Group Notes:  (Nursing/MHT/Case Management/Adjunct)  Date:  03/07/2016  Time:  1:58 AM  Type of Therapy:  Group Therapy  Participation Level:  Did Not Attend    Melanie Bell 03/07/2016, 1:58 AM

## 2016-04-21 ENCOUNTER — Inpatient Hospital Stay (HOSPITAL_COMMUNITY)
Admission: EM | Admit: 2016-04-21 | Discharge: 2016-05-02 | DRG: 395 | Disposition: A | Discharge status: Home / Self Care | Payer: Medicaid Other | Attending: Internal Medicine | Admitting: Internal Medicine

## 2016-04-21 ENCOUNTER — Encounter (HOSPITAL_COMMUNITY): Payer: Self-pay | Admitting: Emergency Medicine

## 2016-04-21 ENCOUNTER — Emergency Department: Payer: Medicaid Other

## 2016-04-21 ENCOUNTER — Encounter: Payer: Self-pay | Admitting: Emergency Medicine

## 2016-04-21 ENCOUNTER — Emergency Department
Admission: EM | Admit: 2016-04-21 | Discharge: 2016-04-21 | Payer: Medicaid Other | Attending: Student in an Organized Health Care Education/Training Program | Admitting: Student in an Organized Health Care Education/Training Program

## 2016-04-21 ENCOUNTER — Encounter (HOSPITAL_COMMUNITY)
Admission: EM | Disposition: A | Discharge status: Home / Self Care | Payer: Self-pay | Source: Home / Self Care | Attending: Internal Medicine

## 2016-04-21 DIAGNOSIS — K219 Gastro-esophageal reflux disease without esophagitis: Secondary | ICD-10-CM | POA: Diagnosis present

## 2016-04-21 DIAGNOSIS — Z888 Allergy status to other drugs, medicaments and biological substances status: Secondary | ICD-10-CM | POA: Diagnosis not present

## 2016-04-21 DIAGNOSIS — T17900A Unspecified foreign body in respiratory tract, part unspecified causing asphyxiation, initial encounter: Secondary | ICD-10-CM

## 2016-04-21 DIAGNOSIS — J45909 Unspecified asthma, uncomplicated: Secondary | ICD-10-CM | POA: Insufficient documentation

## 2016-04-21 DIAGNOSIS — F329 Major depressive disorder, single episode, unspecified: Secondary | ICD-10-CM | POA: Diagnosis present

## 2016-04-21 DIAGNOSIS — I10 Essential (primary) hypertension: Secondary | ICD-10-CM | POA: Insufficient documentation

## 2016-04-21 DIAGNOSIS — T182XXA Foreign body in stomach, initial encounter: Secondary | ICD-10-CM | POA: Diagnosis not present

## 2016-04-21 DIAGNOSIS — T18198D Other foreign object in esophagus causing other injury, subsequent encounter: Secondary | ICD-10-CM

## 2016-04-21 DIAGNOSIS — Y939 Activity, unspecified: Secondary | ICD-10-CM | POA: Diagnosis not present

## 2016-04-21 DIAGNOSIS — E785 Hyperlipidemia, unspecified: Secondary | ICD-10-CM | POA: Diagnosis present

## 2016-04-21 DIAGNOSIS — F259 Schizoaffective disorder, unspecified: Secondary | ICD-10-CM | POA: Diagnosis present

## 2016-04-21 DIAGNOSIS — X838XXA Intentional self-harm by other specified means, initial encounter: Secondary | ICD-10-CM | POA: Insufficient documentation

## 2016-04-21 DIAGNOSIS — G4700 Insomnia, unspecified: Secondary | ICD-10-CM | POA: Diagnosis not present

## 2016-04-21 DIAGNOSIS — T1491XA Suicide attempt, initial encounter: Secondary | ICD-10-CM | POA: Diagnosis present

## 2016-04-21 DIAGNOSIS — F17210 Nicotine dependence, cigarettes, uncomplicated: Secondary | ICD-10-CM | POA: Diagnosis not present

## 2016-04-21 DIAGNOSIS — Y999 Unspecified external cause status: Secondary | ICD-10-CM | POA: Diagnosis not present

## 2016-04-21 DIAGNOSIS — G2401 Drug induced subacute dyskinesia: Secondary | ICD-10-CM

## 2016-04-21 DIAGNOSIS — R52 Pain, unspecified: Secondary | ICD-10-CM

## 2016-04-21 DIAGNOSIS — Z8269 Family history of other diseases of the musculoskeletal system and connective tissue: Secondary | ICD-10-CM

## 2016-04-21 DIAGNOSIS — Z91013 Allergy to seafood: Secondary | ICD-10-CM

## 2016-04-21 DIAGNOSIS — E039 Hypothyroidism, unspecified: Secondary | ICD-10-CM | POA: Diagnosis not present

## 2016-04-21 DIAGNOSIS — Z79899 Other long term (current) drug therapy: Secondary | ICD-10-CM | POA: Insufficient documentation

## 2016-04-21 DIAGNOSIS — F251 Schizoaffective disorder, depressive type: Secondary | ICD-10-CM | POA: Diagnosis not present

## 2016-04-21 DIAGNOSIS — Z825 Family history of asthma and other chronic lower respiratory diseases: Secondary | ICD-10-CM

## 2016-04-21 DIAGNOSIS — Y929 Unspecified place or not applicable: Secondary | ICD-10-CM | POA: Diagnosis not present

## 2016-04-21 DIAGNOSIS — Y92199 Unspecified place in other specified residential institution as the place of occurrence of the external cause: Secondary | ICD-10-CM

## 2016-04-21 DIAGNOSIS — G40909 Epilepsy, unspecified, not intractable, without status epilepticus: Secondary | ICD-10-CM

## 2016-04-21 DIAGNOSIS — Z836 Family history of other diseases of the respiratory system: Secondary | ICD-10-CM

## 2016-04-21 DIAGNOSIS — Z8249 Family history of ischemic heart disease and other diseases of the circulatory system: Secondary | ICD-10-CM

## 2016-04-21 DIAGNOSIS — T17908A Unspecified foreign body in respiratory tract, part unspecified causing other injury, initial encounter: Secondary | ICD-10-CM

## 2016-04-21 DIAGNOSIS — Z833 Family history of diabetes mellitus: Secondary | ICD-10-CM

## 2016-04-21 DIAGNOSIS — R109 Unspecified abdominal pain: Secondary | ICD-10-CM

## 2016-04-21 DIAGNOSIS — R569 Unspecified convulsions: Secondary | ICD-10-CM | POA: Diagnosis present

## 2016-04-21 DIAGNOSIS — T189XXA Foreign body of alimentary tract, part unspecified, initial encounter: Secondary | ICD-10-CM | POA: Insufficient documentation

## 2016-04-21 DIAGNOSIS — K5900 Constipation, unspecified: Secondary | ICD-10-CM | POA: Diagnosis not present

## 2016-04-21 DIAGNOSIS — R1110 Vomiting, unspecified: Secondary | ICD-10-CM

## 2016-04-21 DIAGNOSIS — F419 Anxiety disorder, unspecified: Secondary | ICD-10-CM | POA: Diagnosis present

## 2016-04-21 DIAGNOSIS — Z91048 Other nonmedicinal substance allergy status: Secondary | ICD-10-CM

## 2016-04-21 DIAGNOSIS — Z818 Family history of other mental and behavioral disorders: Secondary | ICD-10-CM

## 2016-04-21 HISTORY — DX: Foreign body of alimentary tract, part unspecified, initial encounter: T18.9XXA

## 2016-04-21 HISTORY — PX: ESOPHAGOGASTRODUODENOSCOPY: SHX5428

## 2016-04-21 LAB — COMPREHENSIVE METABOLIC PANEL
ALT: 7 U/L — AB (ref 14–54)
AST: 16 U/L (ref 15–41)
Albumin: 4.4 g/dL (ref 3.5–5.0)
Alkaline Phosphatase: 81 U/L (ref 38–126)
Anion gap: 6 (ref 5–15)
BUN: 14 mg/dL (ref 6–20)
CHLORIDE: 106 mmol/L (ref 101–111)
CO2: 25 mmol/L (ref 22–32)
CREATININE: 0.7 mg/dL (ref 0.44–1.00)
Calcium: 9.7 mg/dL (ref 8.9–10.3)
GFR calc Af Amer: >60 mL/min (ref >60)
GLUCOSE: 69 mg/dL (ref 65–99)
Potassium: 3.7 mmol/L (ref 3.5–5.1)
SODIUM: 137 mmol/L (ref 135–145)
Total Bilirubin: 0.4 mg/dL (ref 0.3–1.2)
Total Protein: 7.9 g/dL (ref 6.5–8.1)

## 2016-04-21 LAB — CBC
HEMATOCRIT: 40.7 % (ref 35.0–47.0)
HEMOGLOBIN: 13.8 g/dL (ref 12.0–16.0)
MCH: 31 pg (ref 26.0–34.0)
MCHC: 33.8 g/dL (ref 32.0–36.0)
MCV: 91.8 fL (ref 80.0–100.0)
Platelets: 210 10*3/uL (ref 150–440)
RBC: 4.43 MIL/uL (ref 3.80–5.20)
RDW: 14.7 % — ABNORMAL HIGH (ref 11.5–14.5)
WBC: 7.7 10*3/uL (ref 3.6–11.0)

## 2016-04-21 LAB — ETHANOL: Alcohol, Ethyl (B): <5 mg/dL (ref <5)

## 2016-04-21 LAB — POCT PREGNANCY, URINE: Preg Test, Ur: NEGATIVE

## 2016-04-21 LAB — URINE DRUG SCREEN, QUALITATIVE (ARMC ONLY)
Amphetamines, Ur Screen: NOT DETECTED
Barbiturates, Ur Screen: NOT DETECTED
Benzodiazepine, Ur Scrn: NOT DETECTED
CANNABINOID 50 NG, UR ~~LOC~~: NOT DETECTED
COCAINE METABOLITE, UR ~~LOC~~: NOT DETECTED
MDMA (ECSTASY) UR SCREEN: NOT DETECTED
Methadone Scn, Ur: NOT DETECTED
Opiate, Ur Screen: NOT DETECTED
PHENCYCLIDINE (PCP) UR S: NOT DETECTED
Tricyclic, Ur Screen: NOT DETECTED

## 2016-04-21 LAB — SALICYLATE LEVEL: Salicylate Lvl: <7 mg/dL (ref 2.8–30.0)

## 2016-04-21 LAB — ACETAMINOPHEN LEVEL: Acetaminophen (Tylenol), Serum: <10 ug/mL — ABNORMAL LOW (ref 10–30)

## 2016-04-21 IMAGING — CR DG ABDOMEN ACUTE W/ 1V CHEST
1 series · 4 of 4 positions shown · non-contrast
Comparison: [DATE]

CLINICAL DATA: Patient swallowed batteries and thumb tack

EXAM:
DG ABDOMEN ACUTE W/ 1V CHEST

[Series 1: dg abd acute w/chest · 0.14mm/px · 4 of 4 slices shown]
[im 1/4]
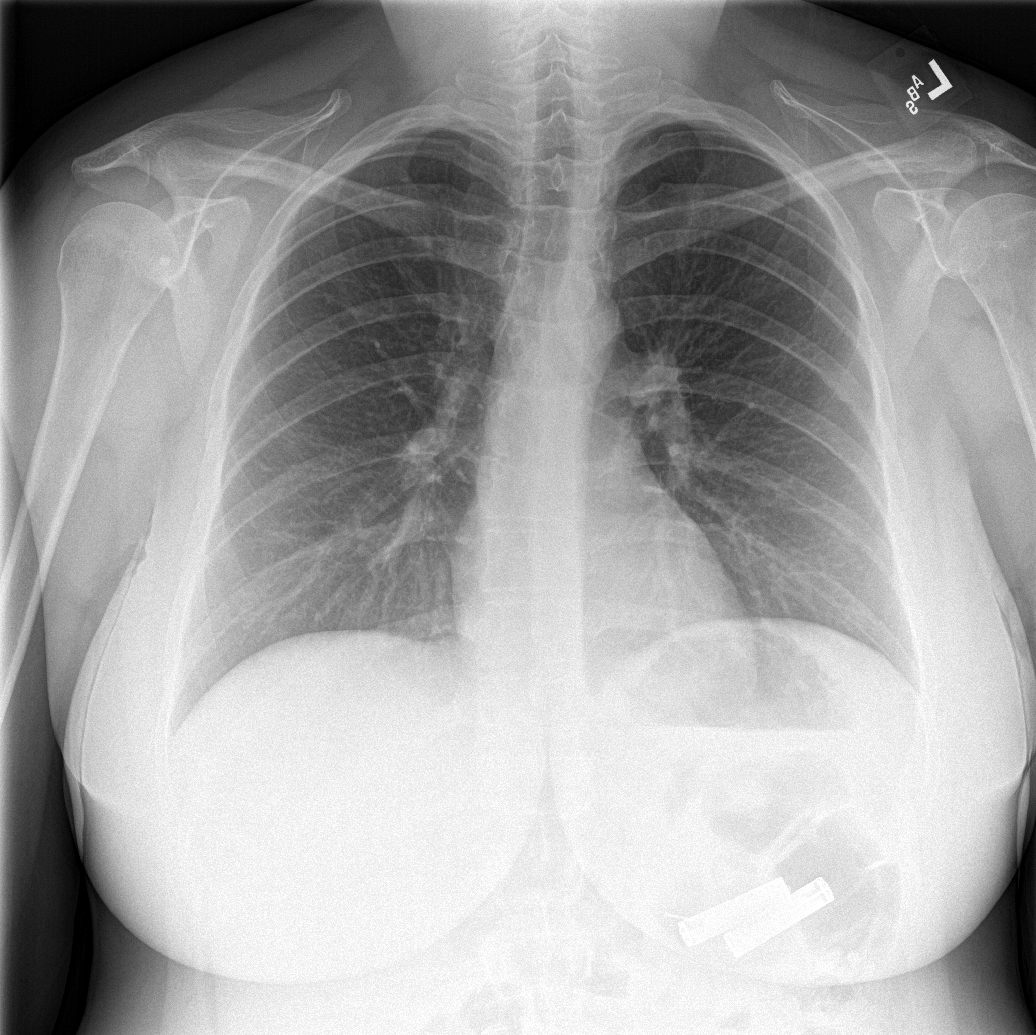
[im 2/4]
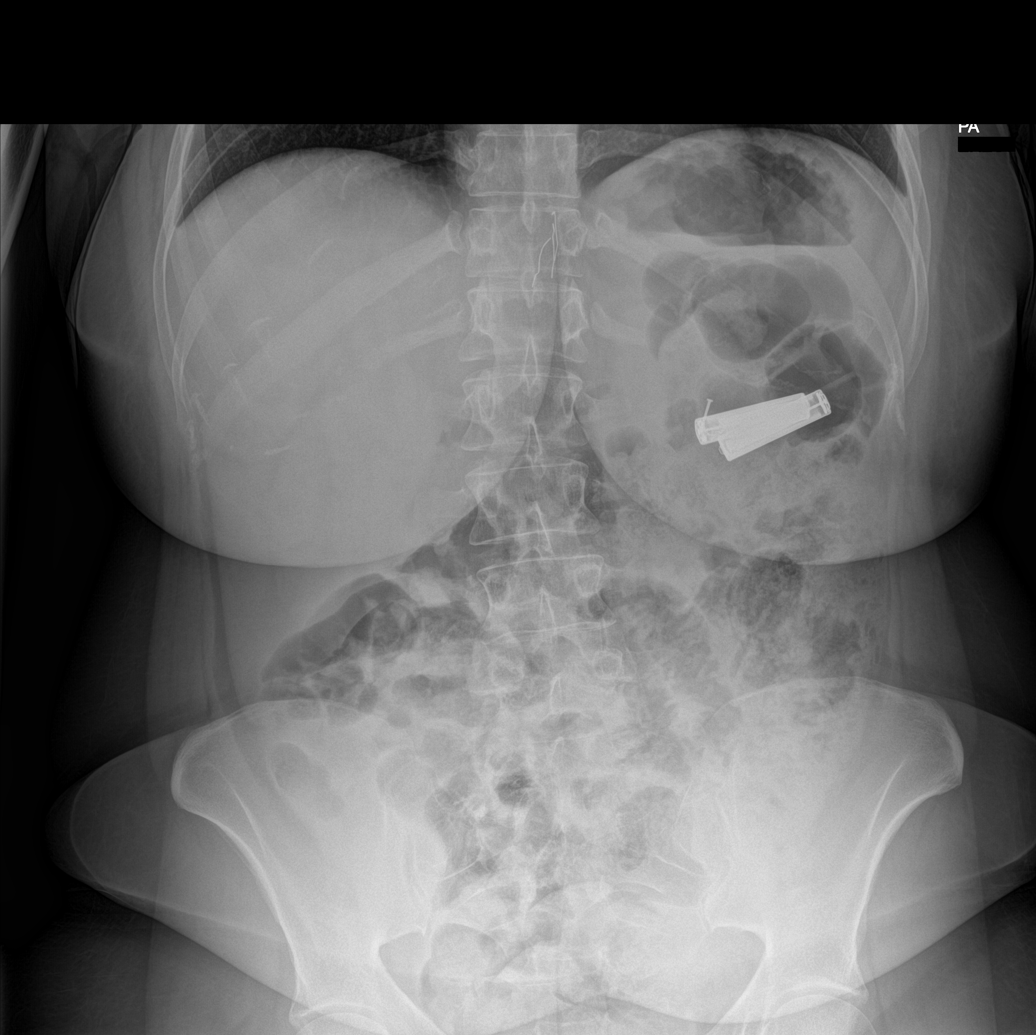
[im 3/4]
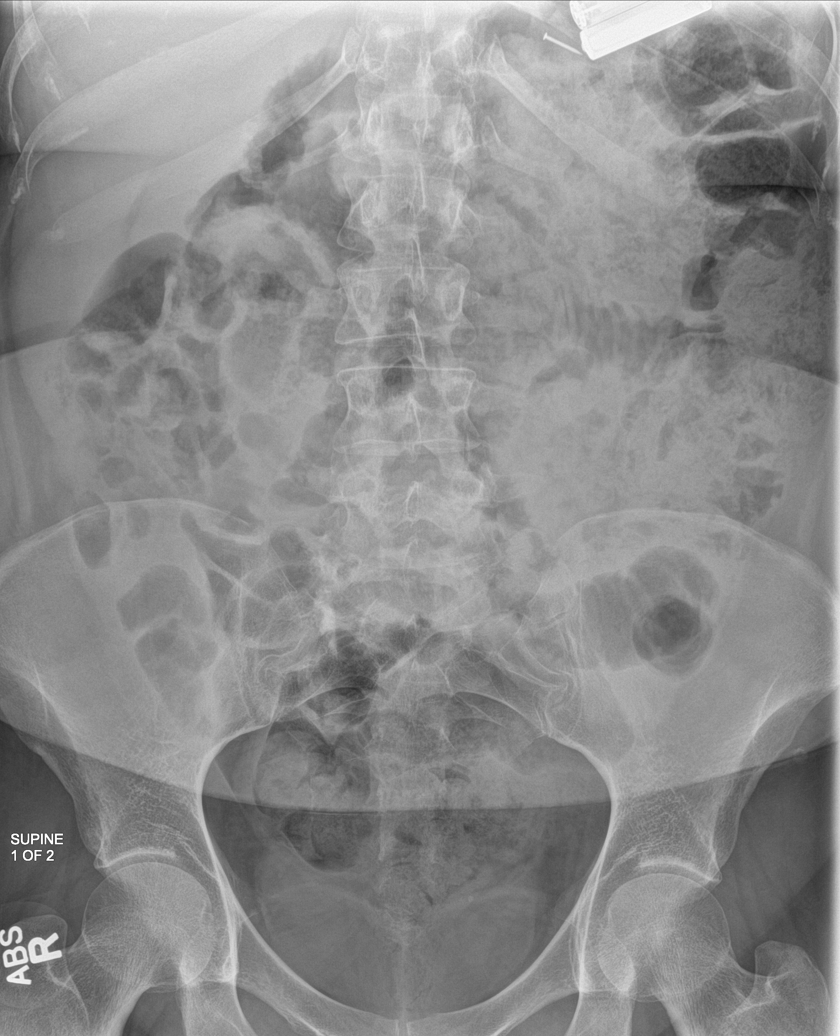
[im 4/4]
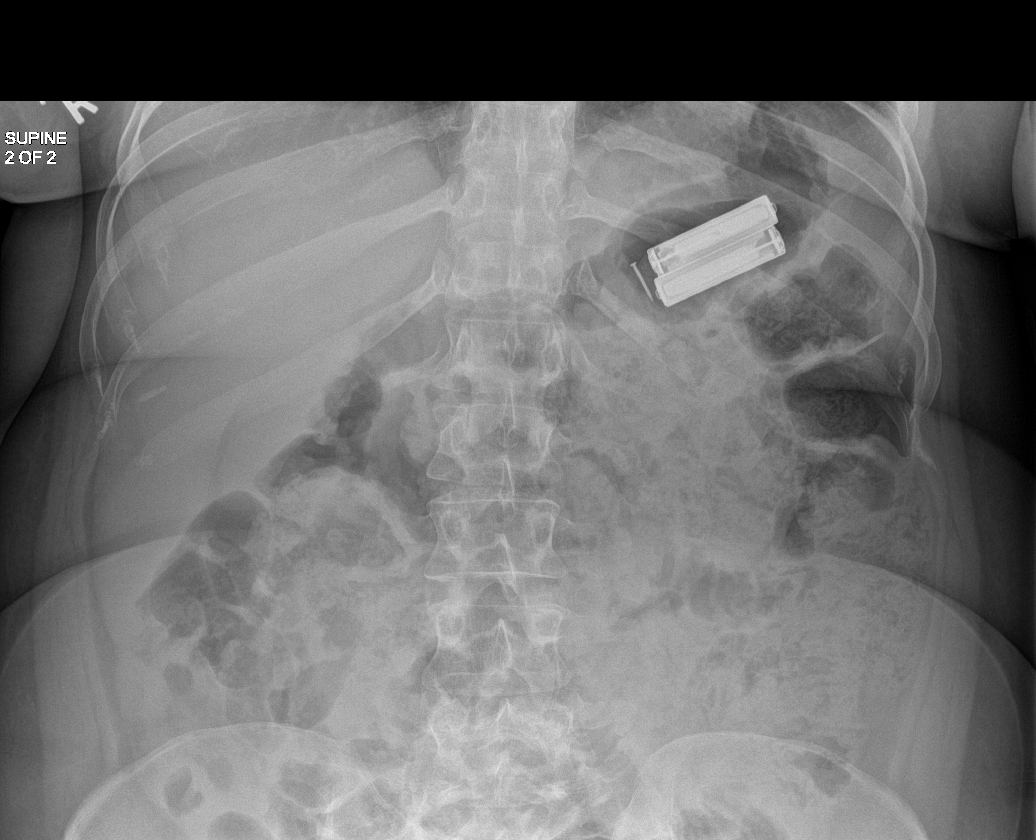

[4 of 4 positions shown; findings below may reference images not displayed]

FINDINGS: The heart, hila, and mediastinum are normal. No pneumothorax. No
pulmonary nodules, masses, or focal infiltrates.

No free air, portal venous gas, or pneumatosis. Two batteries are
seen in the left upper quadrant in addition to the reported thumb
tack. This is favored to be within the stomach. Three adjacent
linear radiodensity projected over the midline of the upper abdomen
on the initial image. However, these were no longer present by the
end of the study and or likely on the patient. No bowel obstruction.
No bony abnormalities.
IMPRESSION: 1. Two batteries and a thumb tack in the left upper quadrant, likely
within the stomach. No evidence of free air on provided images.
2. Three linear densities over the midline of the upper abdomen at
the beginning of this study were not present by the end of the study
and were likely on the patient. Recommend clinical correlation.

## 2016-04-21 SURGERY — EGD (ESOPHAGOGASTRODUODENOSCOPY)
Anesthesia: Moderate Sedation

## 2016-04-21 MED ORDER — NICOTINE 14 MG/24HR TD PT24
14.0000 mg | MEDICATED_PATCH | Freq: Every day | TRANSDERMAL | Status: DC
  Administered 2016-04-21 – 2016-05-02 (×12): 14 mg via TRANSDERMAL
  Filled 2016-04-21 (×12): qty 1

## 2016-04-21 MED ORDER — MIDAZOLAM HCL 10 MG/2ML IJ SOLN
INTRAMUSCULAR | Status: DC | PRN
  Administered 2016-04-21 (×5): 2 mg via INTRAVENOUS

## 2016-04-21 MED ORDER — BUTAMBEN-TETRACAINE-BENZOCAINE 2-2-14 % EX AERO
INHALATION_SPRAY | CUTANEOUS | Status: DC | PRN
Start: 2016-04-21 — End: 2016-04-21
  Administered 2016-04-21: 2 via TOPICAL

## 2016-04-21 MED ORDER — MIDAZOLAM HCL 5 MG/ML IJ SOLN
INTRAMUSCULAR | Status: AC
  Filled 2016-04-21: qty 3

## 2016-04-21 MED ORDER — SODIUM CHLORIDE 0.9 % IV SOLN
INTRAVENOUS | Status: DC
  Administered 2016-04-21: 250 mL via INTRAVENOUS

## 2016-04-21 MED ORDER — DIPHENHYDRAMINE HCL 50 MG/ML IJ SOLN
INTRAMUSCULAR | Status: DC | PRN
  Administered 2016-04-21: 50 mg via INTRAVENOUS

## 2016-04-21 MED ORDER — LEVOTHYROXINE SODIUM 75 MCG PO TABS
75.0000 ug | ORAL_TABLET | Freq: Every day | ORAL | Status: DC
  Administered 2016-04-22 – 2016-05-02 (×11): 75 ug via ORAL
  Filled 2016-04-21 (×11): qty 1

## 2016-04-21 MED ORDER — FENTANYL CITRATE (PF) 100 MCG/2ML IJ SOLN
INTRAMUSCULAR | Status: AC
  Filled 2016-04-21: qty 4

## 2016-04-21 MED ORDER — DIPHENHYDRAMINE HCL 50 MG/ML IJ SOLN
INTRAMUSCULAR | Status: AC
  Filled 2016-04-21: qty 1

## 2016-04-21 MED ORDER — TRAZODONE HCL 50 MG PO TABS
50.0000 mg | ORAL_TABLET | Freq: Every day | ORAL | Status: DC
  Administered 2016-04-21 – 2016-05-01 (×11): 50 mg via ORAL
  Filled 2016-04-21 (×11): qty 1

## 2016-04-21 MED ORDER — FENTANYL CITRATE (PF) 100 MCG/2ML IJ SOLN
INTRAMUSCULAR | Status: DC | PRN
  Administered 2016-04-21 (×3): 25 ug via INTRAVENOUS

## 2016-04-21 MED ORDER — ENOXAPARIN SODIUM 40 MG/0.4ML ~~LOC~~ SOLN
40.0000 mg | SUBCUTANEOUS | Status: DC
  Administered 2016-04-22 – 2016-05-02 (×11): 40 mg via SUBCUTANEOUS
  Filled 2016-04-21 (×11): qty 0.4

## 2016-04-21 NOTE — ED Notes (Signed)
Attempted report 

## 2016-04-21 NOTE — ED Notes (Signed)
Endoscopy at bedside. 

## 2016-04-21 NOTE — ED Triage Notes (Signed)
Per [facility], pt from [place]. Pt has ingested tacks and batteries. 1 tumbtack and 2 AAA batteries. Hx of  Same. SI. Pt in NAD
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
## 2016-04-21 NOTE — ED Notes (Signed)
Staffing notified for sitter. PT has Hotel manager with her at bedside

## 2016-04-21 NOTE — ED Provider Notes (Addendum)
[place] DEPT Provider Note   CSN: [id number] Arrival date & time: 04/21/16  1725     History   Chief Complaint Chief Complaint  Patient presents with  . Suicidal  . Ingestion  . Medical Clearance    HPI LEAUNDRA EERNISSE is a 25 y.o. female.  HPI   Patient is a 42 her old female presenting with foreign body. Patient had done the 7 times in the past. Patient sent here from [facility]. No GI on call ther. According to their note patient Dr. Fuller Plan was already consult. Patient reports that she was doing this in attempt to hurt herself.  X-ray showed 2 batteries and a thumb tack.   Past Medical History:  Diagnosis Date  . Anxiety   . Asthma   . Depression   . GERD (gastroesophageal reflux disease)   . Hallucinations 09/30/2014   Sizoaffective  . Hyperlipidemia   . Hypertension   . Tardive dyskinesia 10/2014   recent onset    Patient Active Problem List   Diagnosis Date Noted  . Breast tumor 03/05/2016  . Foreign body ingestion(chronic issue) 07/31/2015  . Schizoaffective disorder, bipolar type ([place]) 11/06/2014  . Tobacco use disorder 09/30/2014  . Hypothyroidism 09/29/2014  . Hypertension 09/29/2014  . Constipation 09/29/2014    Past Surgical History:  Procedure Laterality Date  . ABDOMINAL SURGERY     "years ago" to remove foreign objects  . APPENDECTOMY    . BREAST LUMPECTOMY Right   . COLONOSCOPY WITH PROPOFOL N/A 09/10/2015   Procedure: COLONOSCOPY WITH PROPOFOL;  Surgeon: [name], MD;  Location[facility] ENDOSCOPY;  Service: Endoscopy;  Laterality: N/A;  . ESOPHAGOGASTRODUODENOSCOPY N/A 11/28/2014   Procedure: ESOPHAGOGASTRODUODENOSCOPY (EGD);  Surgeon: [name], MD;  Location: [facility] ENDOSCOPY;  Service: Endoscopy;  Laterality: N/A;  . ESOPHAGOGASTRODUODENOSCOPY N/A 02/21/2016   Procedure: ESOPHAGOGASTRODUODENOSCOPY (EGD);  Surgeon: Mauri Pole, MD;  Location: [facility] ENDOSCOPY;  Service: Endoscopy;  Laterality: N/A;  .  ESOPHAGOGASTRODUODENOSCOPY (EGD) WITH PROPOFOL N/A 02/29/2016   Procedure: ESOPHAGOGASTRODUODENOSCOPY (EGD) WITH PROPOFOL;  Surgeon: [name], MD;  Location: [facility] ENDOSCOPY;  Service: Endoscopy;  Laterality: N/A;  . LAPAROTOMY N/A 09/12/2015   Procedure: EXPLORATORY LAPAROTOMY;  Surgeon: Florene Glen, MD;  Location: [facility] ORS;  Service: General;  Laterality: N/A;  . SIGMOIDOSCOPY N/A 09/12/2015   Procedure: Lonell Face;  Surgeon: Florene Glen, MD;  Location: [facility] ORS;  Service: General;  Laterality: N/A;  . WISDOM TOOTH EXTRACTION      OB History    Gravida Para Term Preterm AB Living   0 0 0 0 0 0   SAB TAB Ectopic Multiple Live Births   0 0 0 0         Home Medications    Prior to Admission medications   Medication Sig Start Date End Date Taking? Authorizing Provider  amantadine (SYMMETREL) 100 MG capsule Take 1 capsule (100 mg total) by mouth 2 (two) times daily. 03/07/16   [name], MD  amLODipine (NORVASC) 2.5 MG tablet Take 1 tablet (2.5 mg total) by mouth daily. 06/19/15   [name], MD  docusate sodium (COLACE) 100 MG capsule Take 2 capsules (200 mg total) by mouth 2 (two) times daily. 03/07/16   [name], MD  fluPHENAZine (PROLIXIN) 5 MG tablet Take 5-10 mg by mouth See admin instructions. Take 5 mg by mouth in the morning, take 5 mg by mouth in the afternoon and take 10 mg by mouth at bedtime.  Historical Provider, MD  levothyroxine (SYNTHROID, LEVOTHROID) 75 MCG tablet Take 1 tablet (75 mcg total) by mouth daily before breakfast. 06/19/15   [name] [name], MD  Lurasidone HCl (LATUDA) 120 MG TABS Take 120 mg by mouth every evening.    Historical Provider, MD  OXcarbazepine (TRILEPTAL) 150 MG tablet Take 1 tablet (150 mg total) by mouth 2 (two) times daily. 06/19/15   [name], MD  polyethylene glycol (MIRALAX / GLYCOLAX) packet Take 17 g by mouth daily. 03/07/16   [name], MD  traZODone  (DESYREL) 50 MG tablet Take 50 mg by mouth at bedtime.    Historical Provider, MD  venlafaxine XR (EFFEXOR-XR) 150 MG 24 hr capsule Take 1 capsule (150 mg total) by mouth daily with breakfast. 06/19/15   [name], MD    Family History Family History  Problem Relation Age of Onset  . Depression Mother   . Hypertension Mother     Social History Social History  Substance Use Topics  . Smoking status: Current Every Day Smoker    Packs/day: 0.50    Years: 3.00    Types: Cigarettes  . Smokeless tobacco: Never Used  . Alcohol use No     Allergies   Betadine [povidone iodine]; Iodine; and Shellfish-derived products   Review of Systems Review of Systems  Constitutional: Negative for fatigue and fever.  Gastrointestinal: Negative for abdominal pain.  Psychiatric/Behavioral: Positive for self-injury and suicidal ideas.     Physical Exam Updated Vital Signs BP 116/79   Pulse 69   Temp 98.4 F (36.9 C)   Resp 18   SpO2 100%   Physical Exam  Constitutional: She is oriented to person, place, and time. She appears well-developed and well-nourished.  HENT:  Head: Normocephalic and atraumatic.  Eyes: Right eye exhibits no discharge.  Cardiovascular: Normal rate, regular rhythm and normal heart sounds.   No murmur heard. Pulmonary/Chest: Effort normal and breath sounds normal. She has no wheezes. She has no rales.  Abdominal: Soft. She exhibits no distension. There is no tenderness.  Neurological: She is oriented to person, place, and time.  Skin: Skin is warm and dry. She is not diaphoretic.  Psychiatric:  SI  Nursing note and vitals reviewed.    ED Treatments / Results  Labs (all labs ordered are listed, but only abnormal results are displayed) Labs Reviewed - No data to display  EKG  EKG Interpretation None       Radiology Dg Abdomen Acute W/chest  Result Date: 04/21/2016 CLINICAL DATA:  Patient swallowed batteries and thumb tack EXAM: DG ABDOMEN  ACUTE W/ 1V CHEST COMPARISON:  March 04, 2016 FINDINGS: The heart, hila, and mediastinum are normal. No pneumothorax. No pulmonary nodules, masses, or focal infiltrates. No free air, portal venous gas, or pneumatosis. Two batteries are seen in the left upper quadrant in addition to the reported thumb tack. This is favored to be within the stomach. Three adjacent linear radiodensity projected over the midline of the upper abdomen on the initial image. However, these were no longer present by the end of the study and or likely on the patient. No bowel obstruction. No bony abnormalities. IMPRESSION: 1. Two batteries and a thumb tack in the left upper quadrant, likely within the stomach. No evidence of free air on provided images. 2. Three linear densities over the midline of the upper abdomen at the beginning of this study were not present by the end of the study and were likely on the  patient. Recommend clinical correlation. Electronically Signed   By: Dorise Bullion III M.D   On: 04/21/2016 14:41    Procedures Procedures (including critical care time)  Medications Ordered in ED Medications - No data to display   Initial Impression / Assessment and Plan / ED Course  I have reviewed the triage vital signs and the nursing notes.  Pertinent labs & imaging results that were available during my care of the patient were reviewed by me and considered in my medical decision making (see chart for details).  Clinical Course     Patient is a 63 old female presenting here with SI, ingestion of foreign objects. Pt has ho schizoaffective, bipolar.  Patient has 2 batteries in a thumb tack. We'll page GI. Once, that issue is resolved, will consult psychiatry.    7:40 PM GI came to see patient unfortunately was only able to remove 1 battery. This means are still 1 battery and a thumb tack retained. Unfortunately patient has had have surgery previously for FB that did not pass. I think therefore patient will  need serial x-rays and observation.   At that point she will need psychiatric evaluation. Final Clinical Impressions(s) / ED Diagnoses   Final diagnoses:  None    New Prescriptions New Prescriptions   No medications on file     Jadae Steinke Julio Alm, MD 04/21/16 1941    Aymara Sassi Julio Alm, MD 04/21/16 2010

## 2016-04-21 NOTE — Brief Op Note (Signed)
04/21/2016  7:13 PM  PATIENT:  Melanie Bell  25 y.o. female  PRE-OPERATIVE DIAGNOSIS:  foreign body  POST-OPERATIVE DIAGNOSIS:  * No post-op diagnosis entered *  PROCEDURE:  Procedure(s): ESOPHAGOGASTRODUODENOSCOPY (EGD) (N/A)  SURGEON:  Surgeon(s) and Role:    * Gatha Mayer, MD - Primary    ANESTHESIA:   Benadryl 50 mg IV, Fentanyl 75 ug IV and Versed 8 mg IV  Findings:  1 AAA battery in stomach - a few pieces of food debris - battery removed from stomach w/ Jabier Mutton net  The other battery and push pin were not seen on inspection into 3rd-4th duodenum  Rec: These are likely to pass - suggest f/u Abd film in AM and serial films - if fail to progress GSU eval next likely

## 2016-04-21 NOTE — ED Notes (Addendum)
10 versed  75 fentnyl 50 bendryl Were given during the procedure by endo. Pt is easily awoken.  One battery was found.

## 2016-04-21 NOTE — Op Note (Signed)
[facility] Patient [name] Procedure Date : 04/21/2016 MRN: [id number] Attending MD: Gatha Mayer , MD Date of Birth: 03/28/1991 CSN: [id number] Age: 25 Admit Type: Emergency Department Procedure:                Upper GI endoscopy Indications:              Foreign body in the stomach Providers:                Gatha Mayer, MD, Vista Lawman, RN, Ralene Bathe,                            Technician Referring MD:              Medicines:                Diphenhydramine 50 mg IV, Midazolam 8 mg IV,                            Fentanyl 75 micrograms IV, Cetacaine spray Complications:            No immediate complications. Estimated Blood Loss:     Estimated blood loss: none. Procedure:                Pre-Anesthesia Assessment:                           - Prior to the procedure, a History and Physical                            was performed, and patient medications and                            allergies were reviewed. The patient's tolerance of                            previous anesthesia was also reviewed. The risks                            and benefits of the procedure and the sedation                            options and risks were discussed with the patient.                            All questions were answered, and informed consent                            was obtained. Prior Anticoagulants: The patient has                            taken no previous anticoagulant or antiplatelet                            agents. ASA Grade Assessment: II - A patient with  mild systemic disease. After reviewing the risks                            and benefits, the patient was deemed in                            satisfactory condition to undergo the procedure.                           After obtaining informed consent, the endoscope was                            passed under direct vision. Throughout the   procedure, the patient's blood pressure, pulse, and                            oxygen saturations were monitored continuously. The                            [id number]) scope was introduced through the                            mouth, and advanced to the third part of duodenum.                            The upper GI endoscopy was accomplished without                            difficulty. The patient tolerated the procedure                            fairly well. Scope In: Scope Out: Findings:      AAA battery x 1 were found in the gastric body. Removal was accomplished       with a Roth net.      A small amount of food (residue) was found in the gastric body.      The exam was otherwise without abnormality. Impression:               - AAA battery x 1 were found in the stomach.                            Removal was successful.                           - A small amount of food (residue) in the stomach.                           - The examination was otherwise normal. Moderate Sedation:      Moderate (conscious) sedation was administered by the endoscopy nurse       and supervised by the endoscopist. The following parameters were       monitored: oxygen saturation, heart rate, blood pressure, respiratory       rate, EKG, adequacy of pulmonary ventilation, and response to care.       Total  physician intraservice time was 15 minutes. Recommendation:           - Admit the patient to hospital ward for ongoing                            care.                           - Resume regular diet.                           - Continue present medications.                           - The battery and push pin should pass - some risk                            of problems but since beyond D3/4 then observation,                            serial xrays starting tomorrow make sense. If fail                            to pass then consider GSU eval or if retained in                             colon colonoscopy. Most objects pass in 4-6 days.                           - Could try a purge with colonoscopy prep. Procedure Code(s):        --- Professional ---                           [phone], Esophagogastroduodenoscopy, flexible,                            transoral; with removal of foreign body(s) Diagnosis Code(s):        --- Professional ---                           [id number], Foreign body in stomach, initial encounter CPT copyright 2016 [employer]. All rights reserved. The codes documented in this report are preliminary and upon coder review may  be revised to meet current compliance requirements. Gatha Mayer, MD 04/21/2016 7:27:52 PM This report has been signed electronically. Number of Addenda: 0
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
## 2016-04-21 NOTE — ED Triage Notes (Signed)
Pt to ED via EMS c/o ingestion and SI.  Patient states swallowed 2 AAA batteries and 1 thumbtack with intention to hurt herself around noon today.  Patient has hx of same.  Pt  C/o nausea.

## 2016-04-21 NOTE — H&P (Signed)
[facility] History and Physical   Primary Care Physician:  Casilda Carls   Reason for Procedure:  Remove gastric foreign bodies  Plan:    EGD     HPI: Melanie Bell is a 25 y.o. female with psychiatric problems and repeated foreign body ingestions. Today she ingested 2 batteries and a push pin. Seen on AXR.  No pain or c/o now   Past Medical History:  Diagnosis Date  . Anxiety   . Asthma   . Depression   . GERD (gastroesophageal reflux disease)   . Hallucinations 09/30/2014   Sizoaffective  . Hyperlipidemia   . Hypertension   . Tardive dyskinesia 10/2014   recent onset    Past Surgical History:  Procedure Laterality Date  . ABDOMINAL SURGERY     "years ago" to remove foreign objects  . APPENDECTOMY    . BREAST LUMPECTOMY Right   . COLONOSCOPY WITH PROPOFOL N/A 09/10/2015   Procedure: COLONOSCOPY WITH PROPOFOL;  Surgeon: [name], MD;  Location: [facility] ENDOSCOPY;  Service: Endoscopy;  Laterality: N/A;  . ESOPHAGOGASTRODUODENOSCOPY N/A 11/28/2014   Procedure: ESOPHAGOGASTRODUODENOSCOPY (EGD);  Surgeon: [name], MD;  Location: [facility] ENDOSCOPY;  Service: Endoscopy;  Laterality: N/A;  . ESOPHAGOGASTRODUODENOSCOPY N/A 02/21/2016   Procedure: ESOPHAGOGASTRODUODENOSCOPY (EGD);  Surgeon: Mauri Pole, MD;  Location: [facility] ENDOSCOPY;  Service: Endoscopy;  Laterality: N/A;  . ESOPHAGOGASTRODUODENOSCOPY (EGD) WITH PROPOFOL N/A 02/29/2016   Procedure: ESOPHAGOGASTRODUODENOSCOPY (EGD) WITH PROPOFOL;  Surgeon: [name], MD;  Location: [facility] ENDOSCOPY;  Service: Endoscopy;  Laterality: N/A;  . LAPAROTOMY N/A 09/12/2015   Procedure: EXPLORATORY LAPAROTOMY;  Surgeon: Florene Glen, MD;  Location: [facility] ORS;  Service: General;  Laterality: N/A;  . SIGMOIDOSCOPY N/A 09/12/2015   Procedure: Lonell Face;  Surgeon: Florene Glen, MD;  Location: [facility] ORS;  Service: General;  Laterality: N/A;  . WISDOM TOOTH EXTRACTION      Prior to Admission  medications   Medication Sig Start Date End Date Taking? Authorizing Provider  amantadine (SYMMETREL) 100 MG capsule Take 1 capsule (100 mg total) by mouth 2 (two) times daily. 03/07/16   [name], MD  amLODipine (NORVASC) 2.5 MG tablet Take 1 tablet (2.5 mg total) by mouth daily. 06/19/15   [name], MD  docusate sodium (COLACE) 100 MG capsule Take 2 capsules (200 mg total) by mouth 2 (two) times daily. 03/07/16   [name], MD  fluPHENAZine (PROLIXIN) 5 MG tablet Take 5-10 mg by mouth See admin instructions. Take 5 mg by mouth in the morning, take 5 mg by mouth in the afternoon and take 10 mg by mouth at bedtime.    Historical Provider, MD  levothyroxine (SYNTHROID, LEVOTHROID) 75 MCG tablet Take 1 tablet (75 mcg total) by mouth daily before breakfast. 06/19/15   [name] [name], MD  Lurasidone HCl (LATUDA) 120 MG TABS Take 120 mg by mouth every evening.    Historical Provider, MD  OXcarbazepine (TRILEPTAL) 150 MG tablet Take 1 tablet (150 mg total) by mouth 2 (two) times daily. 06/19/15   [name], MD  polyethylene glycol (MIRALAX / GLYCOLAX) packet Take 17 g by mouth daily. 03/07/16   [name], MD  traZODone (DESYREL) 50 MG tablet Take 50 mg by mouth at bedtime.    Historical Provider, MD  venlafaxine XR (EFFEXOR-XR) 150 MG 24 hr capsule Take 1 capsule (150 mg total) by mouth daily with breakfast. 06/19/15   [name], MD    No current facility-administered medications for  this encounter.    Current Outpatient Prescriptions  Medication Sig Dispense Refill  . amantadine (SYMMETREL) 100 MG capsule Take 1 capsule (100 mg total) by mouth 2 (two) times daily. 60 capsule 0  . amLODipine (NORVASC) 2.5 MG tablet Take 1 tablet (2.5 mg total) by mouth daily. 30 tablet 0  . docusate sodium (COLACE) 100 MG capsule Take 2 capsules (200 mg total) by mouth 2 (two) times daily. 120 capsule 0  . fluPHENAZine (PROLIXIN) 5 MG  tablet Take 5-10 mg by mouth See admin instructions. Take 5 mg by mouth in the morning, take 5 mg by mouth in the afternoon and take 10 mg by mouth at bedtime.    Marland Kitchen levothyroxine (SYNTHROID, LEVOTHROID) 75 MCG tablet Take 1 tablet (75 mcg total) by mouth daily before breakfast. 30 tablet 0  . Lurasidone HCl (LATUDA) 120 MG TABS Take 120 mg by mouth every evening.    . OXcarbazepine (TRILEPTAL) 150 MG tablet Take 1 tablet (150 mg total) by mouth 2 (two) times daily. 60 tablet 0  . polyethylene glycol (MIRALAX / GLYCOLAX) packet Take 17 g by mouth daily. 30 each 0  . traZODone (DESYREL) 50 MG tablet Take 50 mg by mouth at bedtime.    Marland Kitchen venlafaxine XR (EFFEXOR-XR) 150 MG 24 hr capsule Take 1 capsule (150 mg total) by mouth daily with breakfast. 30 capsule 0    Allergies as of 04/21/2016 - Review Complete 04/21/2016  Allergen Reaction Noted  . Betadine [povidone iodine] Other (See Comments) 09/29/2014  . Iodine Rash 09/29/2014  . Shellfish-derived products Other (See Comments) 11/05/2014    Family History  Problem Relation Age of Onset  . Depression Mother   . Hypertension Mother     Social History   Social History  . Marital status: Single    Spouse name: N/A  . Number of children: N/A  . Years of education: N/A   Occupational History  . Not on file.   Social History Main Topics  . Smoking status: Current Every Day Smoker    Packs/day: 0.50    Years: 3.00    Types: Cigarettes  . Smokeless tobacco: Never Used  . Alcohol use No  . Drug use: No  . Sexual activity: No   Other Topics Concern  . Not on file   Social History Narrative  . No narrative on file    Review of Systems: As per HPI Physical Exam: Vital signs in last 24 hours: Temp:  [98.1 F (36.7 C)-98.4 F (36.9 C)] 98.4 F (36.9 C) (12/07 1745) Pulse Rate:  [69-90] 79 (12/07 1830) Resp:  [16-22] 22 (12/07 1830) BP: (112-139)/(70-86) 118/74 (12/07 1830) SpO2:  [99 %-100 %] 99 % (12/07 1830) Weight:  [205  lb (93 kg)] 205 lb (93 kg) (12/07 1830)   General:   Alert,  Well-developed, well-nourished, pleasant and cooperative in NAD Lungs:  Clear throughout to auscultation.   Heart:  Regular rate and rhythm; no murmurs, clicks, rubs,  or gallops. Abdomen:  Soft, nontender and nondistended. Normal bowel sounds.   Neuro/Psych:  Alert and cooperative.   @Carl  Simonne Maffucci, MD, [facility] [phone] (pager) 04/21/2016 6:39 PM@

## 2016-04-22 ENCOUNTER — Encounter (HOSPITAL_COMMUNITY): Payer: Self-pay | Admitting: Internal Medicine

## 2016-04-22 ENCOUNTER — Observation Stay (HOSPITAL_COMMUNITY): Payer: Medicaid Other

## 2016-04-22 DIAGNOSIS — Z9889 Other specified postprocedural states: Secondary | ICD-10-CM | POA: Diagnosis not present

## 2016-04-22 DIAGNOSIS — Z79899 Other long term (current) drug therapy: Secondary | ICD-10-CM | POA: Diagnosis not present

## 2016-04-22 DIAGNOSIS — I10 Essential (primary) hypertension: Secondary | ICD-10-CM | POA: Diagnosis not present

## 2016-04-22 DIAGNOSIS — Z720 Tobacco use: Secondary | ICD-10-CM | POA: Diagnosis not present

## 2016-04-22 DIAGNOSIS — X58XXXA Exposure to other specified factors, initial encounter: Secondary | ICD-10-CM | POA: Diagnosis not present

## 2016-04-22 DIAGNOSIS — R112 Nausea with vomiting, unspecified: Secondary | ICD-10-CM | POA: Diagnosis not present

## 2016-04-22 DIAGNOSIS — T189XXA Foreign body of alimentary tract, part unspecified, initial encounter: Secondary | ICD-10-CM | POA: Diagnosis not present

## 2016-04-22 DIAGNOSIS — E039 Hypothyroidism, unspecified: Secondary | ICD-10-CM | POA: Diagnosis not present

## 2016-04-22 DIAGNOSIS — F259 Schizoaffective disorder, unspecified: Secondary | ICD-10-CM | POA: Diagnosis not present

## 2016-04-22 LAB — GLUCOSE, CAPILLARY: GLUCOSE-CAPILLARY: 274 mg/dL — AB (ref 65–99)

## 2016-04-22 LAB — TSH: TSH: 2.778 u[IU]/mL (ref 0.350–4.500)

## 2016-04-22 IMAGING — CR DG ABDOMEN 2V
2 series · 2 of 2 positions shown · non-contrast
Comparison: Prior radiograph from earlier the same day.

CLINICAL DATA: Initial evaluation for foreign body, swallowed a
battery intact.

EXAM:
ABDOMEN - 2 VIEW

[abdomen erect]
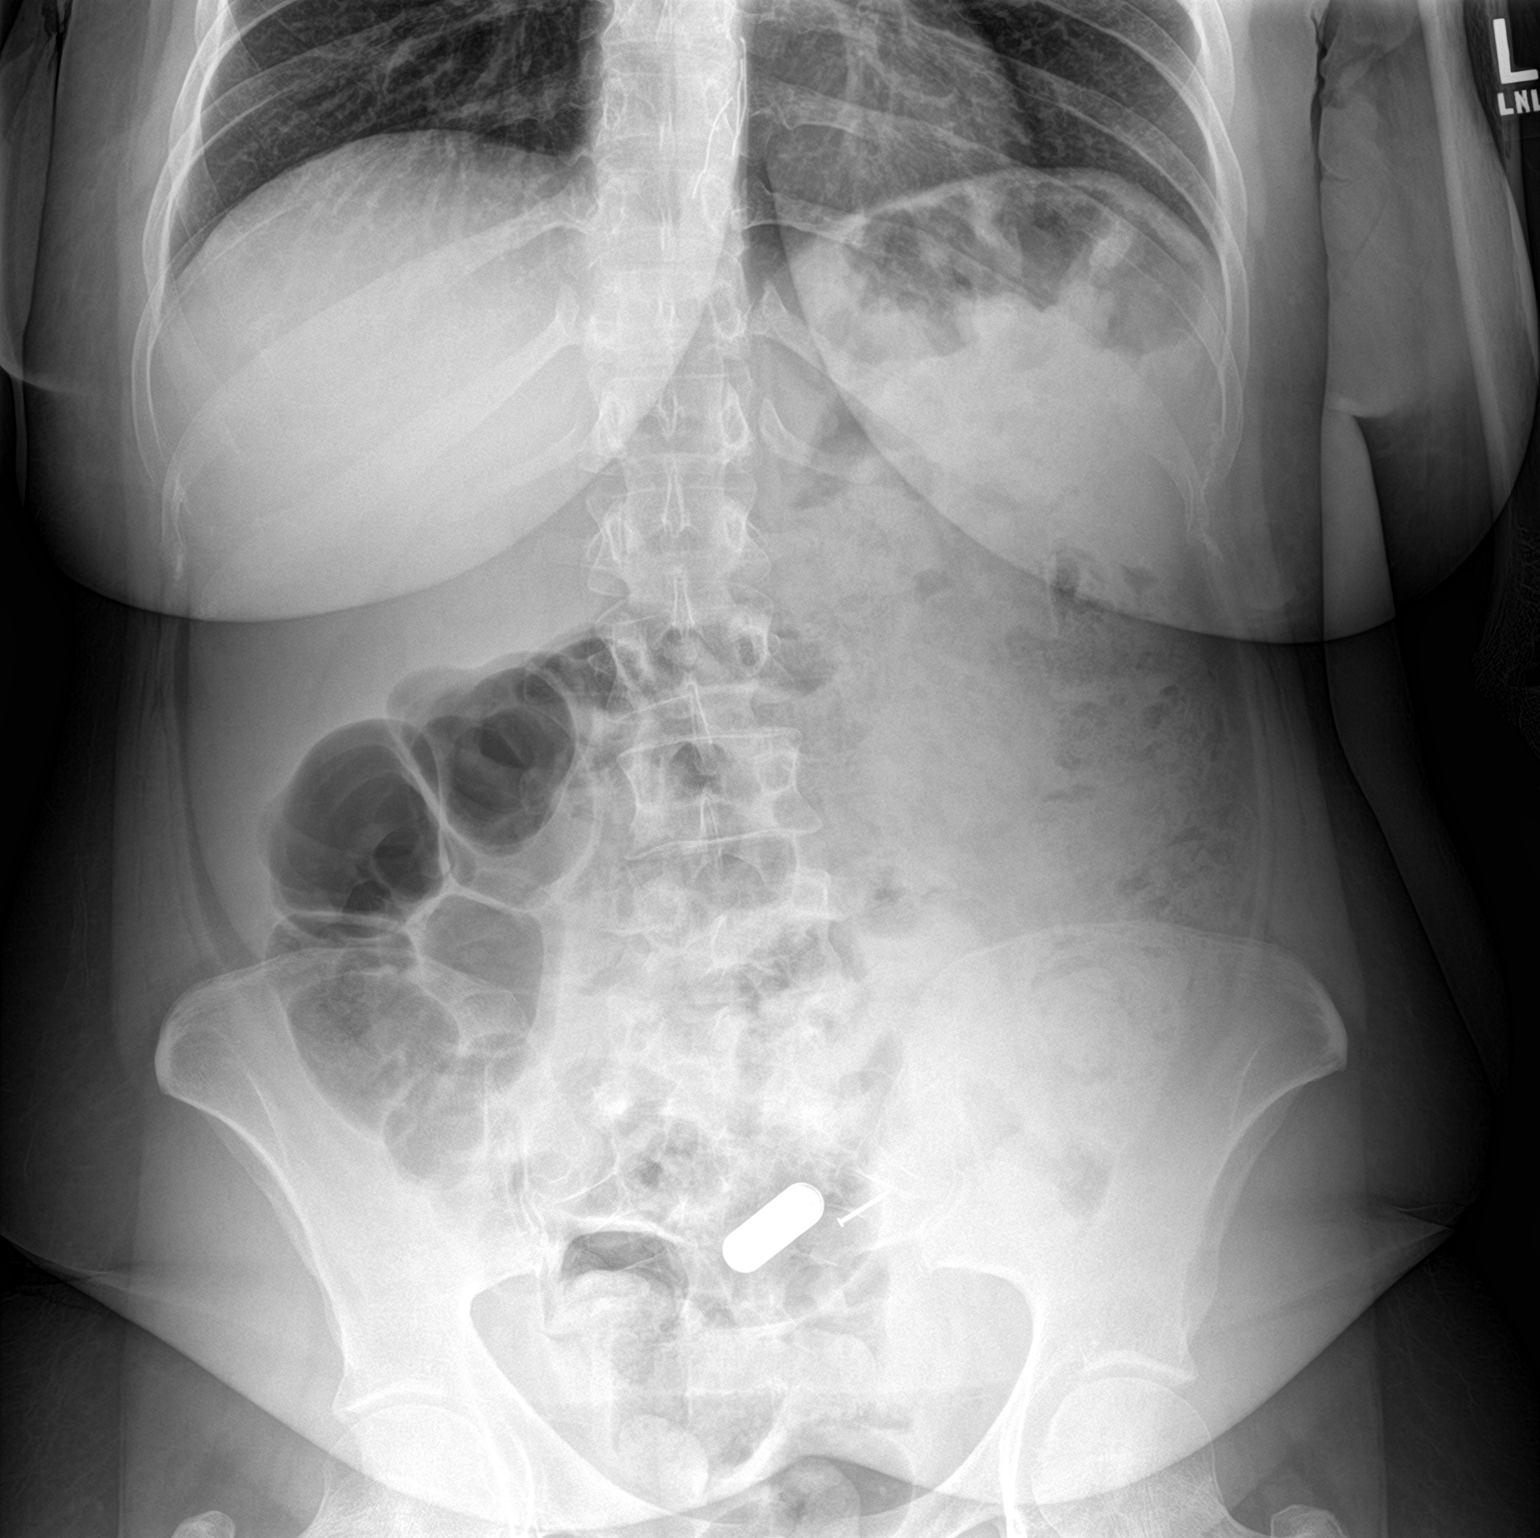

[abdomen supine]
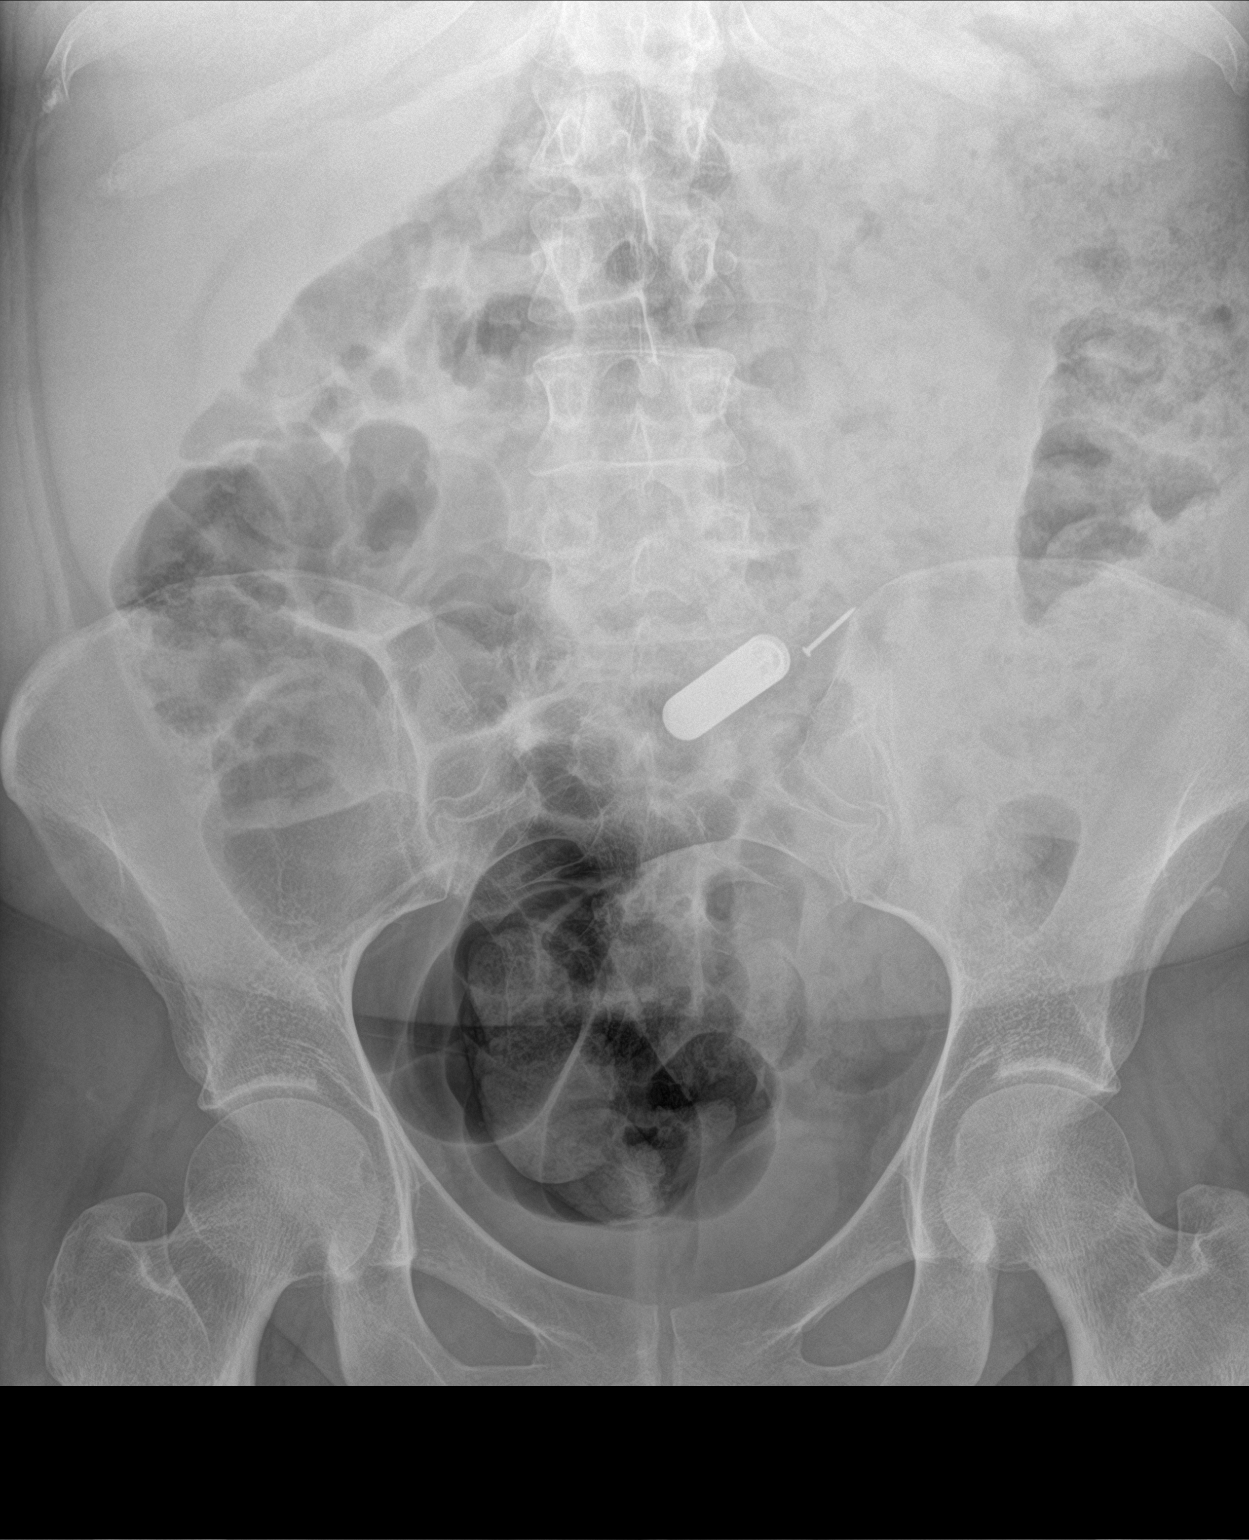

[2 of 2 positions shown; findings below may reference images not displayed]

FINDINGS: Bowel gas pattern remains within normal limits. No evidence for
obstruction or ileus. No free air.

Metallic battery remains within the lower abdomen, positioned over
the sacrum. Small metallic tacks position adjacent to the battery.
Three linear metallic densities continued overlie the distal
esophagus.

No other focal abnormality.
IMPRESSION: 1. Metallic battery remains within the lower abdomen, positioned
over the sacrum. Metallic tack positioned immediately adjacent to
the battery.
2. Similar appearance of 3 metallic densities overlying the distal
esophagus, similar to prior.

## 2016-04-22 IMAGING — DX DG ABDOMEN 2V
2 series · 2 of 2 positions shown · non-contrast
Comparison: [DATE]

CLINICAL DATA: Ingested foreign bodies

EXAM:
ABDOMEN - 2 VIEW

[w abdomen upright]
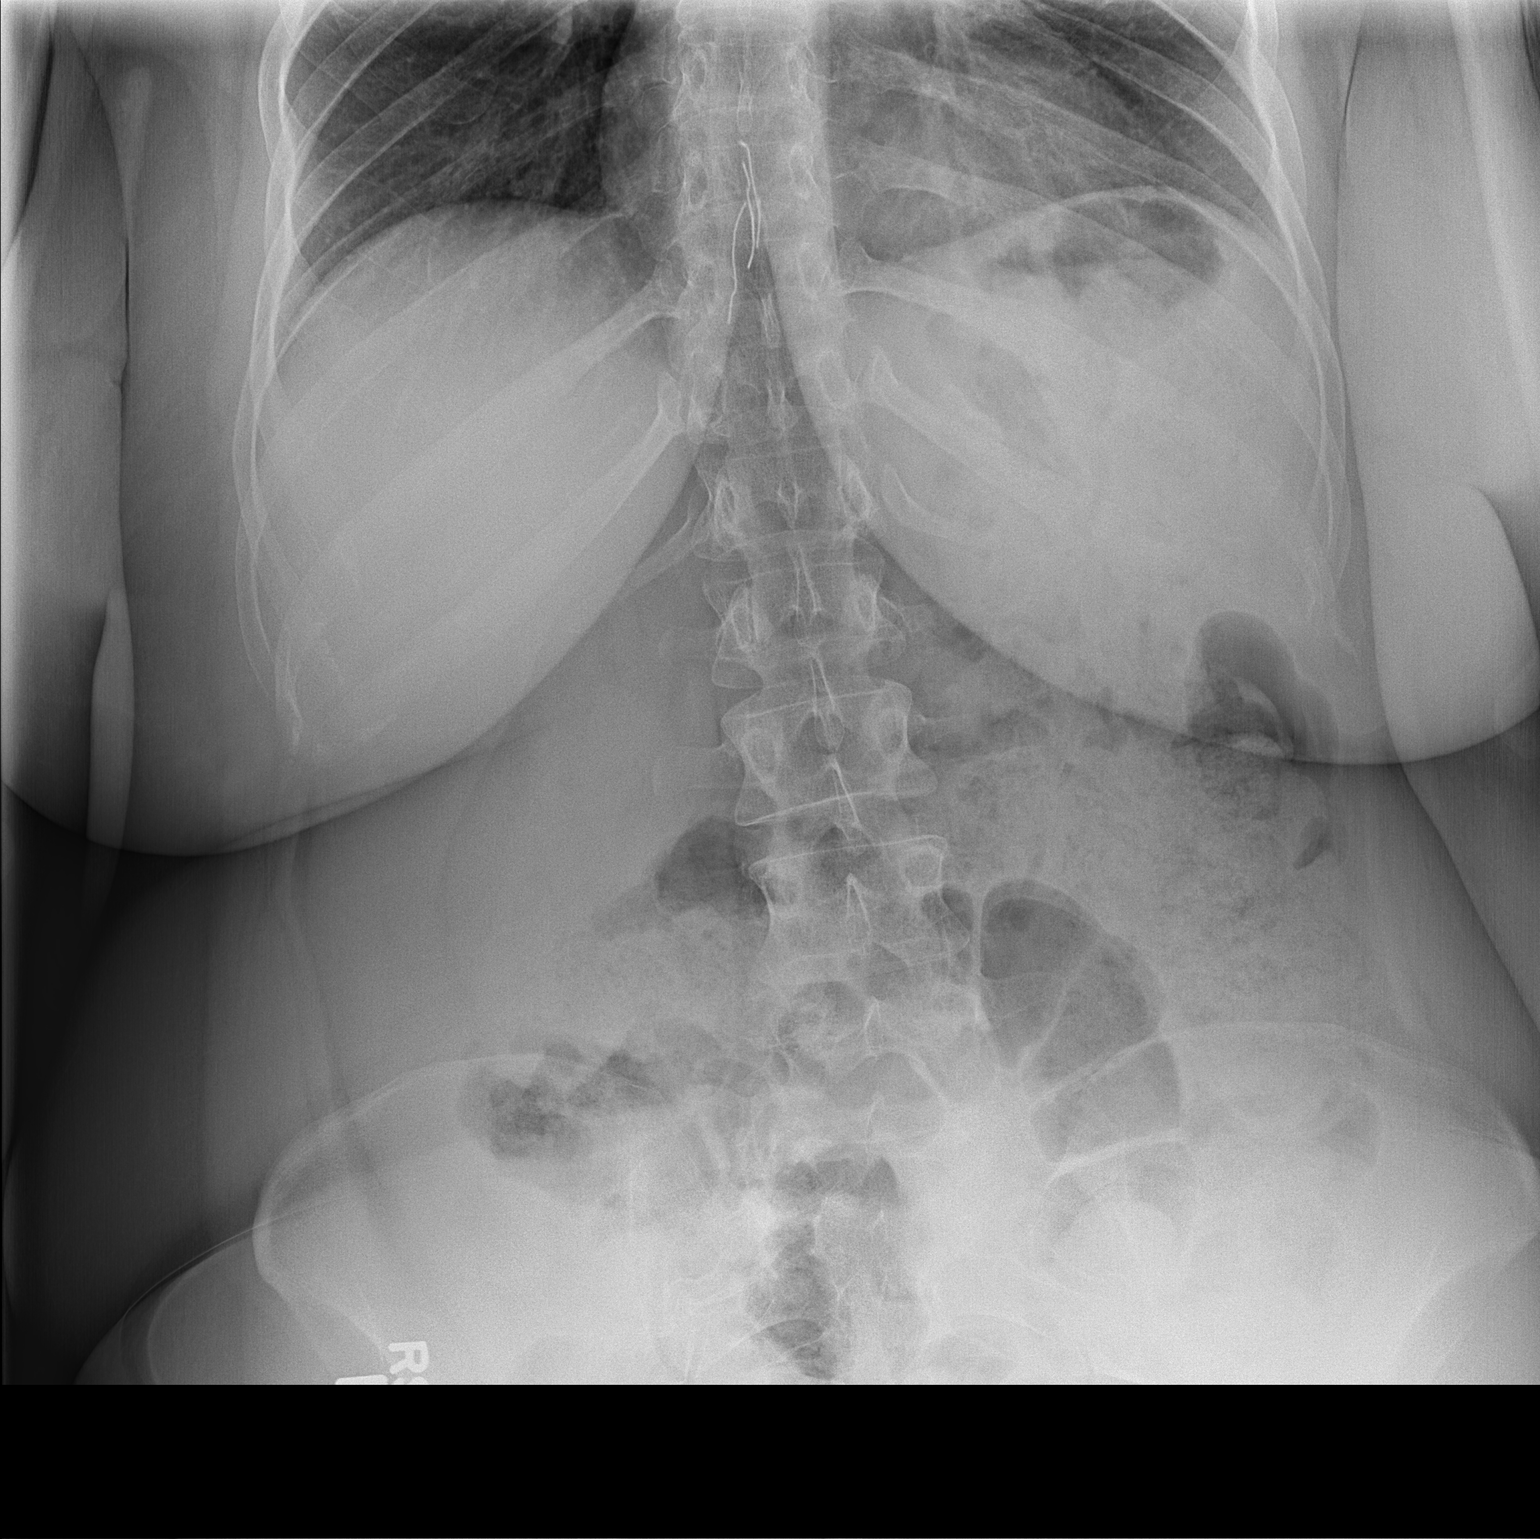

[t abdomen supine]
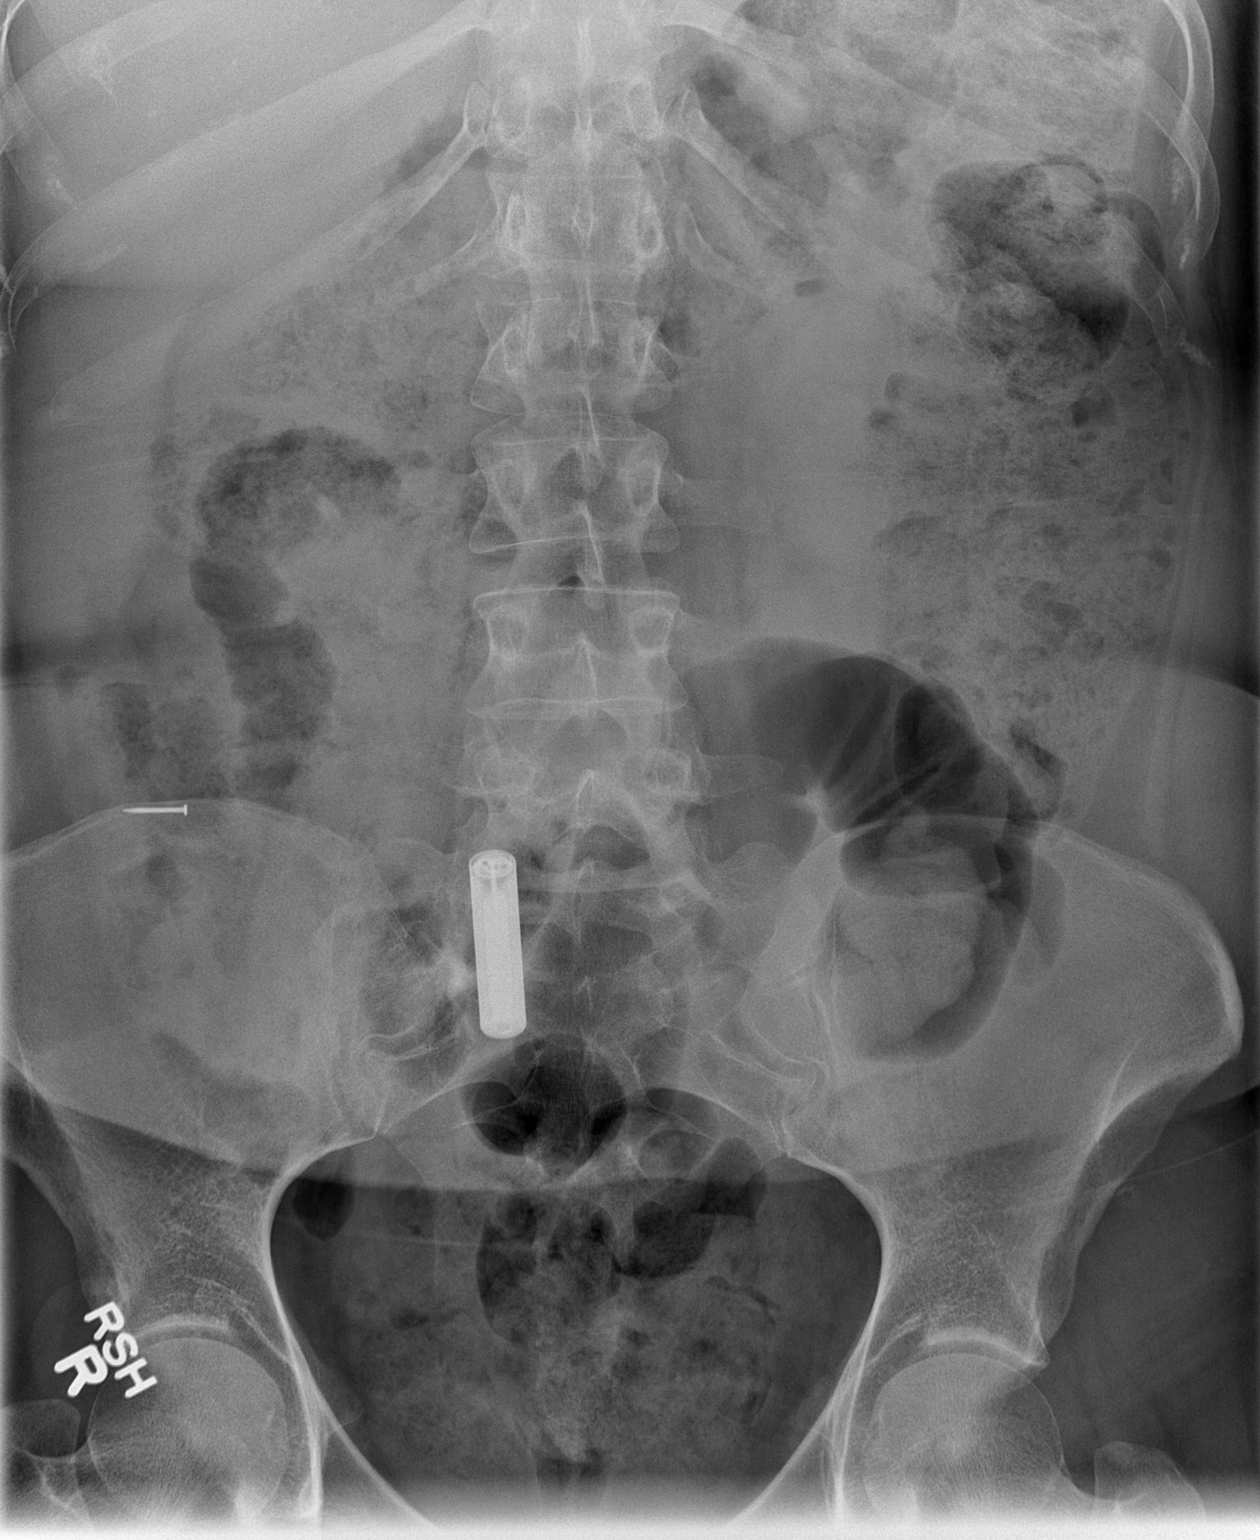

[2 of 2 positions shown; findings below may reference images not displayed]

FINDINGS: Scattered large and small bowel gas is noted. Fecal material is
noted throughout the colon. One of the previously ingested batteries
lies in the mid abdomen overlying the sacrum. The second battery is
not present and has likely passed. The small nail is seen in the
right lower quadrant likely in the ascending colon. A previously
described 3 linear densities are again identified in the expected
region of the distal esophagus. No other focal abnormality is noted.
IMPRESSION: One of the batteries appears to have passed in the interval from the
prior exam.

The second battery lies over the sacrum and may be within the
rectosigmoid region.

Ingested nail is noted in the right lower quadrant.

The previously described 3 linear densities are again noted
overlying the distal esophagus. They are unchanged from the prior
exam.

## 2016-04-22 MED ORDER — FLUPHENAZINE HCL 5 MG PO TABS: 5.0000 mg | ORAL_TABLET | ORAL | Status: DC

## 2016-04-22 MED ORDER — KETOROLAC TROMETHAMINE 15 MG/ML IJ SOLN
30.0000 mg | Freq: Once | INTRAMUSCULAR | Status: AC
  Administered 2016-04-22: 30 mg via INTRAVENOUS
  Filled 2016-04-22: qty 2

## 2016-04-22 MED ORDER — LURASIDONE HCL 40 MG PO TABS
120.0000 mg | ORAL_TABLET | Freq: Every evening | ORAL | Status: DC
  Administered 2016-04-22 – 2016-05-01 (×10): 120 mg via ORAL
  Filled 2016-04-22 (×11): qty 3

## 2016-04-22 MED ORDER — FLUPHENAZINE HCL 5 MG PO TABS
5.0000 mg | ORAL_TABLET | Freq: Two times a day (BID) | ORAL | Status: DC
  Administered 2016-04-22 – 2016-05-02 (×20): 5 mg via ORAL
  Filled 2016-04-22 (×25): qty 1

## 2016-04-22 MED ORDER — OXCARBAZEPINE 150 MG PO TABS
150.0000 mg | ORAL_TABLET | Freq: Two times a day (BID) | ORAL | Status: DC
  Administered 2016-04-22 – 2016-05-02 (×20): 150 mg via ORAL
  Filled 2016-04-22 (×21): qty 1

## 2016-04-22 MED ORDER — FLUPHENAZINE HCL 10 MG PO TABS
10.0000 mg | ORAL_TABLET | Freq: Every day | ORAL | Status: DC
  Administered 2016-04-22 – 2016-05-01 (×10): 10 mg via ORAL
  Filled 2016-04-22 (×10): qty 1

## 2016-04-22 NOTE — Progress Notes (Addendum)
SUBJECTIVE She was complaining of abdominal pain which started 10-15 minutes prior to our arrival. Pain is mainly below umbilicus. She was able to eat today without nausea. No problems passing bowel or urine.   OBJECTIVE Vitals:   04/22/16 1507 04/22/16 2049  BP: 124/71 130/76  Pulse: (!) 104 87  Resp: 18 20  Temp: 98.2 F (36.8 C) 98.3 F (36.8 C)   Physical Exam  Constitutional: No distress.  Pulmonary/Chest: Effort normal. No respiratory distress.  Abdominal: Soft. Bowel sounds are normal. She exhibits no distension. There is tenderness (Left lower quadrant on deep palpation). There is no rebound and no guarding.  No grimacing was elicited when I palpated her abdomen with my stethoscope while auscultating though on deep palpation of the left lower quadrant she did have some grimacing.   Skin: She is not diaphoretic.    ASSESSMENT Given that she still has not passed the battery and thumbtack, I be most worried about a bowel perforation. However, her overall exam is reassuring for no signs of an acute abdomen.  PLAN -Check stat abdominal x-ray to rule out free air  ADDENDUM 04/22/2016  11:25 PM:  Ab XR reassuring for no free air though the tack is now adjacent to the battery. Prescribed ketoralac 30 mg IV x 1.

## 2016-04-22 NOTE — Progress Notes (Signed)
   Subjective: No acute events overnight. Denies nausea, vomiting or abdominal pain. Denies suicidal or homicidal ideation.  Objective:  Vital signs in last 24 hours: Vitals:   04/21/16 2030 04/21/16 2100 04/21/16 2130 04/22/16 0618  BP: 111/77 114/75 117/63 117/67  Pulse: 77 74 60 76  Resp: 23 20 19 18   Temp:   97.7 F (36.5 C) 98 F (36.7 C)  TempSrc:   Oral Oral  SpO2: 97% 98% 100% 99%  Weight:   195 lb 12.8 oz (88.8 kg)   Height:   5\' 6"  (1.676 m)    Physical Exam  Constitutional: She is oriented to person, place, and time. She appears well-developed and well-nourished.  HENT:  Head: Normocephalic and atraumatic.  Cardiovascular: Normal rate and regular rhythm.  Exam reveals no gallop and no friction rub.   No murmur heard. Respiratory: Effort normal and breath sounds normal. No respiratory distress. She has no wheezes.  GI: Soft. Bowel sounds are normal. She exhibits no distension. There is no tenderness.  No tenderness to palpation on abdominal examination. Bowel sounds appreciated on auscultation.  Musculoskeletal: She exhibits no edema.  Neurological: She is alert and oriented to person, place, and time.  Psychiatric:  Denies suicidal or homicidal ideation     Assessment/Plan:  Active Problems:   Hypothyroidism   Tobacco use disorder   Swallowed foreign body   While we wait for the patient to pass the foreign bodies ingested we will contact her facility to find out exactly which medication she was taking. At the current moment we will hold her psychiatric medications until we have confirmation of the exact regimen.  1. Foreign body ingestion 1 battery removed by GI. The patient has remaining battery and tack. We will continue to monitor her clinically and wait for her to pass these objects. -- Wait for objects to pass  2. Schizoaffective disorder -- Currently holding psychiatric medications  3. Hypertension Currently normotensive. -- Hold home  amlodipine  4.Hypothyroidism  --Last TSH 07/2015 0.394. -- synthroid 75 mg daily.   5. Tobacco Use -- Nicotine patch  DVT/PE prophylaxis: Lovenox FEN/GI: Normal diet  Dispo: Anticipated discharge following passage of objects.  Ophelia Shoulder, MD 04/22/2016, 1:33 PM Pager: [phone]

## 2016-04-22 NOTE — Progress Notes (Signed)
Pt had several soft stools today but she said that did not felt passing the tack or the battery yet. No c/o abd pain.

## 2016-04-23 ENCOUNTER — Inpatient Hospital Stay (HOSPITAL_COMMUNITY): Payer: Medicaid Other

## 2016-04-23 DIAGNOSIS — Z720 Tobacco use: Secondary | ICD-10-CM

## 2016-04-23 DIAGNOSIS — R1030 Lower abdominal pain, unspecified: Secondary | ICD-10-CM

## 2016-04-23 DIAGNOSIS — F259 Schizoaffective disorder, unspecified: Secondary | ICD-10-CM

## 2016-04-23 DIAGNOSIS — Z9889 Other specified postprocedural states: Secondary | ICD-10-CM

## 2016-04-23 DIAGNOSIS — F17210 Nicotine dependence, cigarettes, uncomplicated: Secondary | ICD-10-CM | POA: Diagnosis present

## 2016-04-23 DIAGNOSIS — G4700 Insomnia, unspecified: Secondary | ICD-10-CM | POA: Diagnosis present

## 2016-04-23 DIAGNOSIS — Z8249 Family history of ischemic heart disease and other diseases of the circulatory system: Secondary | ICD-10-CM | POA: Diagnosis not present

## 2016-04-23 DIAGNOSIS — I10 Essential (primary) hypertension: Secondary | ICD-10-CM

## 2016-04-23 DIAGNOSIS — X838XXA Intentional self-harm by other specified means, initial encounter: Secondary | ICD-10-CM | POA: Diagnosis not present

## 2016-04-23 DIAGNOSIS — E785 Hyperlipidemia, unspecified: Secondary | ICD-10-CM | POA: Diagnosis present

## 2016-04-23 DIAGNOSIS — R112 Nausea with vomiting, unspecified: Secondary | ICD-10-CM | POA: Diagnosis not present

## 2016-04-23 DIAGNOSIS — X58XXXA Exposure to other specified factors, initial encounter: Secondary | ICD-10-CM | POA: Diagnosis not present

## 2016-04-23 DIAGNOSIS — R300 Dysuria: Secondary | ICD-10-CM

## 2016-04-23 DIAGNOSIS — K219 Gastro-esophageal reflux disease without esophagitis: Secondary | ICD-10-CM | POA: Diagnosis present

## 2016-04-23 DIAGNOSIS — R569 Unspecified convulsions: Secondary | ICD-10-CM | POA: Diagnosis present

## 2016-04-23 DIAGNOSIS — T819XXA Unspecified complication of procedure, initial encounter: Secondary | ICD-10-CM | POA: Diagnosis not present

## 2016-04-23 DIAGNOSIS — T182XXA Foreign body in stomach, initial encounter: Secondary | ICD-10-CM | POA: Diagnosis present

## 2016-04-23 DIAGNOSIS — Z825 Family history of asthma and other chronic lower respiratory diseases: Secondary | ICD-10-CM | POA: Diagnosis not present

## 2016-04-23 DIAGNOSIS — Z833 Family history of diabetes mellitus: Secondary | ICD-10-CM | POA: Diagnosis not present

## 2016-04-23 DIAGNOSIS — F419 Anxiety disorder, unspecified: Secondary | ICD-10-CM | POA: Diagnosis present

## 2016-04-23 DIAGNOSIS — F250 Schizoaffective disorder, bipolar type: Secondary | ICD-10-CM | POA: Diagnosis not present

## 2016-04-23 DIAGNOSIS — Z79899 Other long term (current) drug therapy: Secondary | ICD-10-CM | POA: Diagnosis not present

## 2016-04-23 DIAGNOSIS — K5900 Constipation, unspecified: Secondary | ICD-10-CM | POA: Diagnosis not present

## 2016-04-23 DIAGNOSIS — Z91048 Other nonmedicinal substance allergy status: Secondary | ICD-10-CM | POA: Diagnosis not present

## 2016-04-23 DIAGNOSIS — T182XXD Foreign body in stomach, subsequent encounter: Secondary | ICD-10-CM | POA: Diagnosis not present

## 2016-04-23 DIAGNOSIS — Z888 Allergy status to other drugs, medicaments and biological substances status: Secondary | ICD-10-CM | POA: Diagnosis not present

## 2016-04-23 DIAGNOSIS — Z818 Family history of other mental and behavioral disorders: Secondary | ICD-10-CM | POA: Diagnosis not present

## 2016-04-23 DIAGNOSIS — F329 Major depressive disorder, single episode, unspecified: Secondary | ICD-10-CM | POA: Diagnosis present

## 2016-04-23 DIAGNOSIS — Z8489 Family history of other specified conditions: Secondary | ICD-10-CM | POA: Diagnosis not present

## 2016-04-23 DIAGNOSIS — T189XXD Foreign body of alimentary tract, part unspecified, subsequent encounter: Secondary | ICD-10-CM | POA: Diagnosis not present

## 2016-04-23 DIAGNOSIS — Z91013 Allergy to seafood: Secondary | ICD-10-CM | POA: Diagnosis not present

## 2016-04-23 DIAGNOSIS — T189XXA Foreign body of alimentary tract, part unspecified, initial encounter: Secondary | ICD-10-CM | POA: Diagnosis not present

## 2016-04-23 DIAGNOSIS — E039 Hypothyroidism, unspecified: Secondary | ICD-10-CM | POA: Diagnosis present

## 2016-04-23 IMAGING — CR DG ABDOMEN 1V
1 series · 1 of 1 positions shown · non-contrast
Comparison: Radiographs yesterday at [9M] hour

CLINICAL DATA: GI tract foreign body. Patient swallowed a battery
and a tack.

EXAM:
ABDOMEN - 1 VIEW

[abdomen kub]
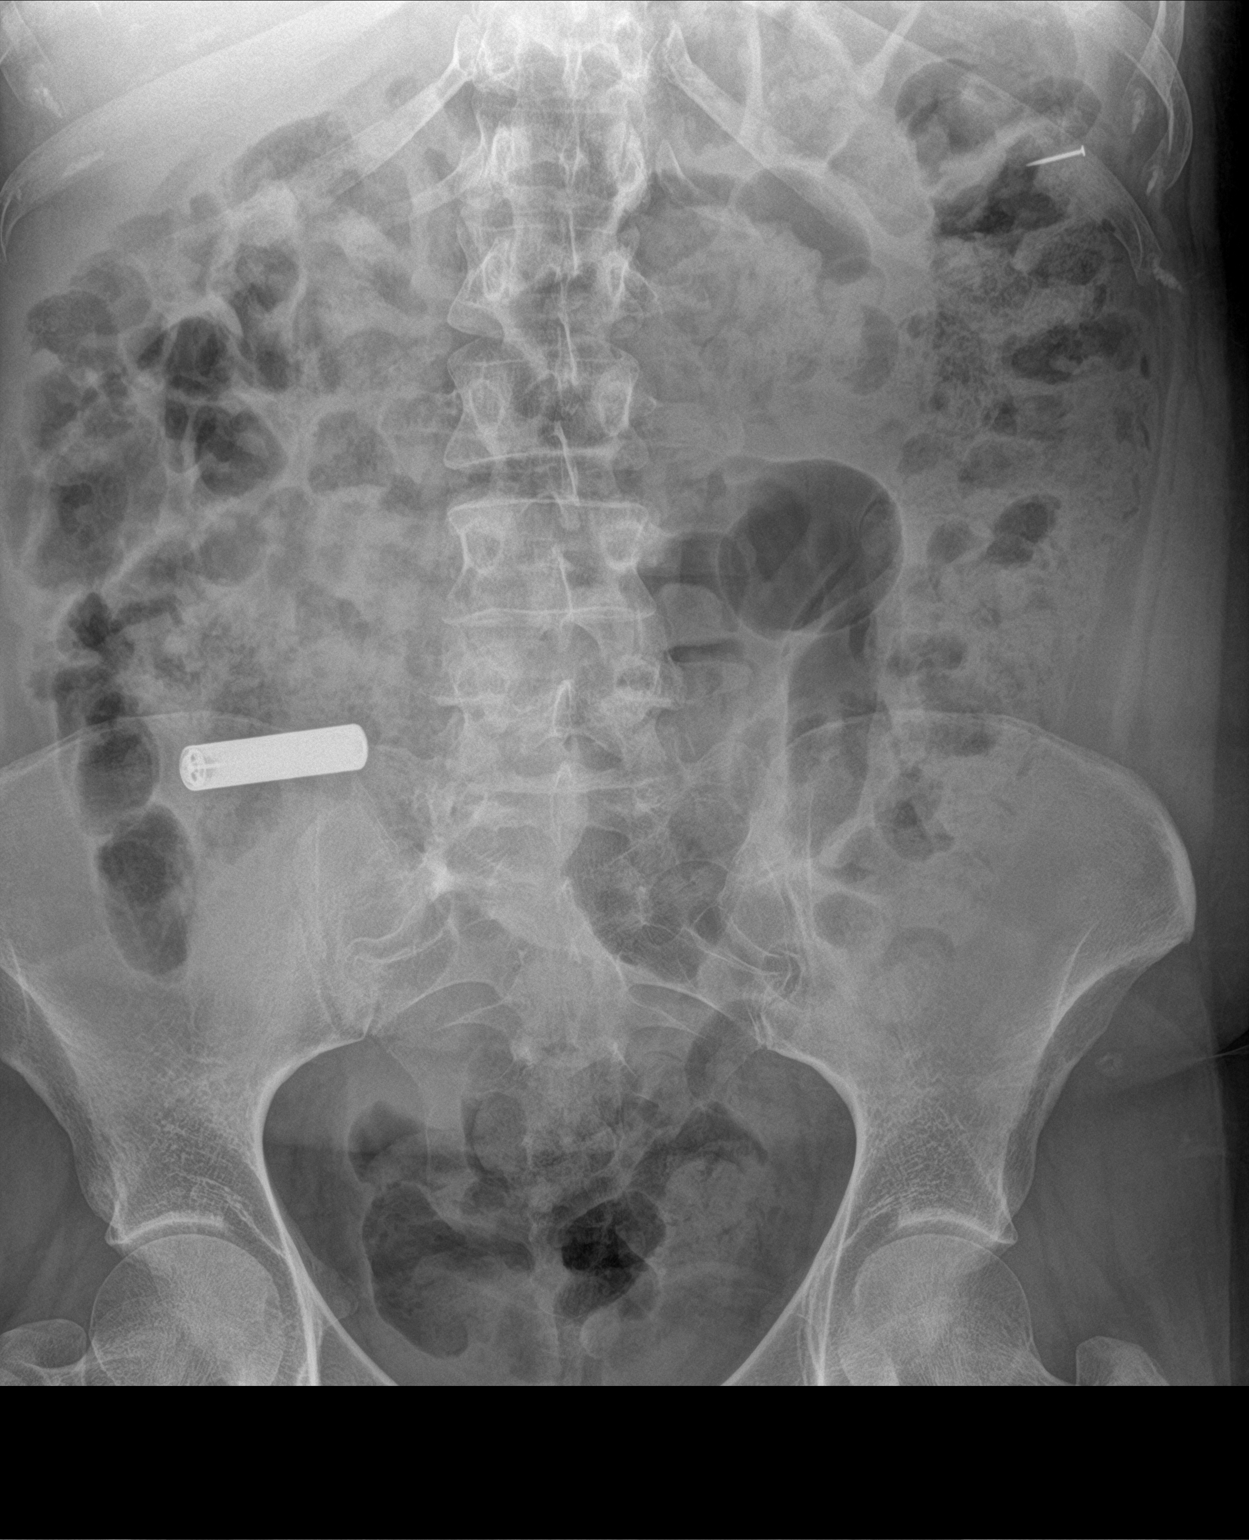

[1 of 1 positions shown; findings below may reference images not displayed]

FINDINGS: The 4.2 cm radiopaque foreign body consistent with battery is now in
the right mid abdomen. The tack is seen in the left upper quadrant.
The previous foreign bodies in the region of the distal esophagus on
prior radiographs are not included in the field of view. No evidence
of free air on supine view. Moderate stool burden again seen. No
definite small bowel dilatation.
IMPRESSION: Battery projects in the right mid abdomen, the tack is in the left
upper quadrant.

## 2016-04-23 IMAGING — DX DG ABDOMEN 1V
1 series · 1 of 1 positions shown · non-contrast
Comparison: Earlier this day at [M9] hour

CLINICAL DATA: Foreign body ingestion.  Concern for free air.

EXAM:
ABDOMEN - 1 VIEW

[abdomen erect]
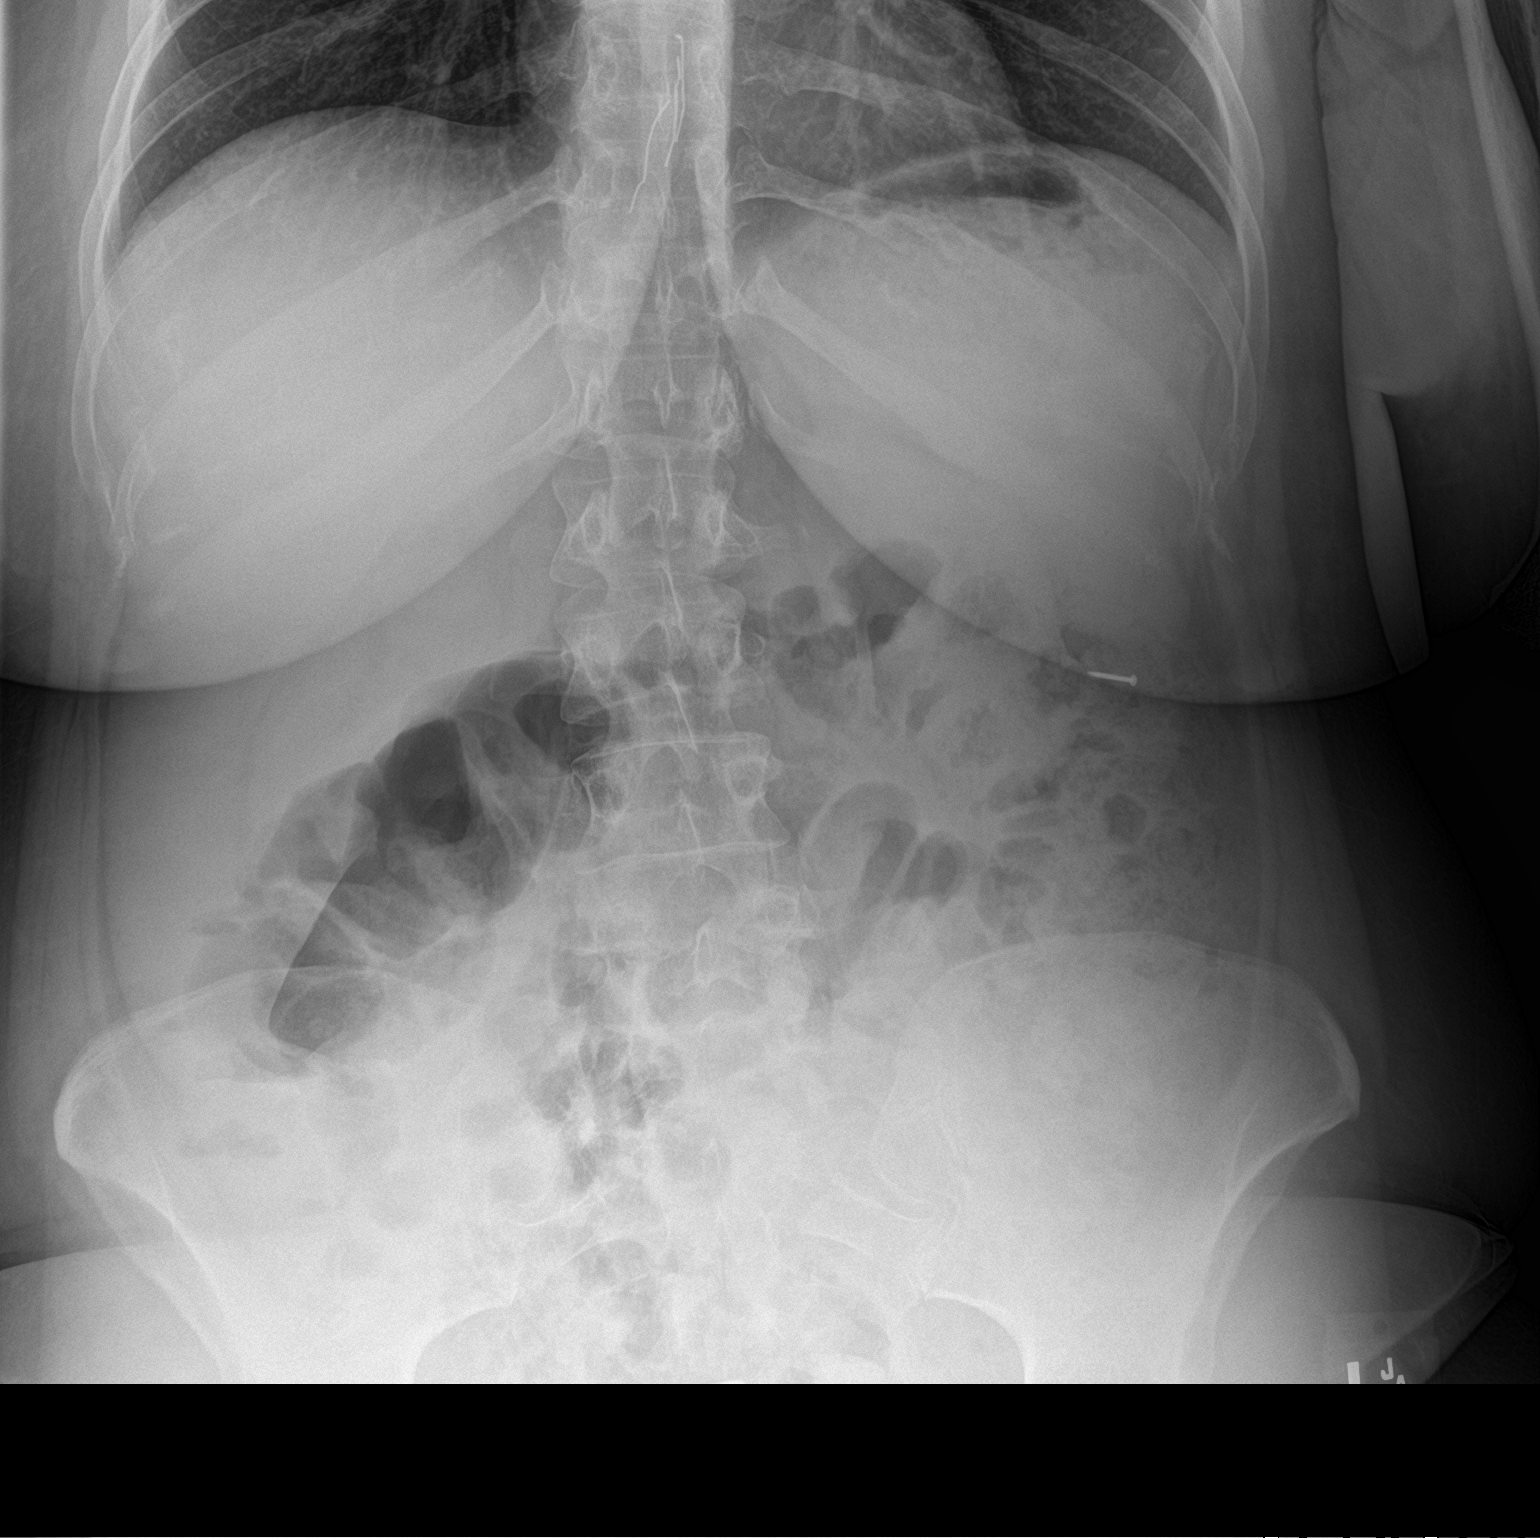

[1 of 1 positions shown; findings below may reference images not displayed]

FINDINGS: The battery overlying the right pelvis and tack in the left upper
quadrant are unchanged in position from recent exam allowing for
upright positioning. No evidence of free intra- abdominal air. No
dilated bowel loops.
IMPRESSION: No evidence of free air.

Battery involving the right pelvis and tack in the left upper
quadrant are unchanged from recent prior.

## 2016-04-23 MED ORDER — KETOROLAC TROMETHAMINE 30 MG/ML IJ SOLN
30.0000 mg | Freq: Once | INTRAMUSCULAR | Status: AC
  Administered 2016-04-23: 30 mg via INTRAVENOUS
  Filled 2016-04-23: qty 1

## 2016-04-23 MED ORDER — ONDANSETRON 4 MG PO TBDP
4.0000 mg | ORAL_TABLET | Freq: Once | ORAL | Status: AC
  Administered 2016-04-23: 4 mg via ORAL
  Filled 2016-04-23: qty 1

## 2016-04-23 MED ORDER — ACETAMINOPHEN 500 MG PO TABS
1000.0000 mg | ORAL_TABLET | Freq: Four times a day (QID) | ORAL | Status: DC | PRN
  Administered 2016-04-23: 1000 mg via ORAL
  Filled 2016-04-23 (×2): qty 2

## 2016-04-23 NOTE — Progress Notes (Signed)
Patient reported worsening abdominal pain. Obtained vital signs and notified Dr. Hetty Ely concerning patient's pain. Dr. Hetty Ely stated she would come to see the patient.

## 2016-04-23 NOTE — Progress Notes (Signed)
Paged regarding abdominal pain, evaluated at bedside.  For the last 2-3 hours she has had sharp, constant, lower abdominal/suprapubic pain, nausea, and one episode of non-bloody emesis.  She has also had dysuria since this morning, no hematuria, and feels similar to prior UTIs.  BP 116/68 (BP Location: Right Arm)   Pulse 83   Temp 97.9 F (36.6 C) (Oral)   Resp 18   Ht 5\' 6"  (1.676 m)   Wt 195 lb 12.8 oz (88.8 kg)   SpO2 100%   BMI 31.60 kg/m   No acute distress Abdomen soft, no distension, suprapubic tenderness to palpation RRR, no respiratory distress  A Young woman with known sharp FB in GI transit and new suprapubic pain, dysuria, nausea, and vomiting.  Symptoms most concerning for UTI, abdominal exam reassuring, but will assess for radiographic signs of free air.  Normal QT 2 months ago, no medication changes since, Zofran OK for nausea.  P Upright KUB UA Zofran Tylenol

## 2016-04-23 NOTE — Progress Notes (Signed)
   Subjective: Overnight she complained of abdominal pain. Repeat abdominal x-ray showing battery in the lower abdomen, positioned over the sacrum. Metallic tack positioned immediately adjacent to the battery and similar appearance of 3 metallic densities overlying the distal esophagus, similar to prior.  At present, denies any nausea or vomiting. Reports having mild suprapubic abdominal pain and burning on urination this am. Denies suicidal or homicidal ideation.  Objective:  Vital signs in last 24 hours: Vitals:   04/22/16 0618 04/22/16 1507 04/22/16 2049 04/23/16 0459  BP: 117/67 124/71 130/76 110/74  Pulse: 76 (!) 104 87 84  Resp: 18 18 20 18   Temp: 98 F (36.7 C) 98.2 F (36.8 C) 98.3 F (36.8 C) 98.9 F (37.2 C)  TempSrc: Oral Oral Oral Oral  SpO2: 99% 100% 100%   Weight:      Height:       Physical Exam  Constitutional: She is oriented to person, place, and time. She appears well-developed and well-nourished.  HENT:  Head: Normocephalic and atraumatic.  Cardiovascular: Normal rate and regular rhythm.  Exam reveals no gallop and no friction rub.   No murmur heard. Respiratory: Effort normal and breath sounds normal. No respiratory distress. She has no wheezes.  GI: Soft. Bowel sounds are normal. She exhibits no distension. There is no tenderness.  No tenderness to palpation on abdominal examination. Bowel sounds appreciated on auscultation.  Musculoskeletal: She exhibits no edema.  Neurological: She is alert and oriented to person, place, and time.  Psychiatric:  Denies suicidal or homicidal ideation     Assessment/Plan:  Active Problems:   Hypothyroidism   Tobacco use disorder   Foreign body ingestion   1. Foreign body ingestion 1 battery removed by GI on EGD. The patient has a remaining battery and tack. Overnight she complained of abdominal pain. Repeat abdominal x-ray showing battery in the lower abdomen, positioned over the sacrum. Metallic tack positioned  immediately adjacent to the battery and similar appearance of 3 metallic densities overlying the distal esophagus, similar to prior. We will continue to monitor her clinically and wait for her to pass these objects. Abdominal exam benign.  -- Wait for objects to pass -- Will check UA as patient endorsed mild suprapubic pain and dysuria   2. Schizoaffective disorder Denies suicidal or homicidal ideation at present.  -- Will consult psych  -- Currently holding psychiatric medications until we have confirmation of the exact regimen   3. Hypertension Currently normotensive. -- Hold home amlodipine  4.Hypothyroidism  TSH checked during this hospitalization is within normal limits.  -- synthroid 75 mg daily.   5. Tobacco Use -- Nicotine patch  DVT/PE prophylaxis: Lovenox FEN/GI: Normal diet  Dispo: Anticipated discharge following passage of objects and clearance by psychiatry.  Shela Leff, MD 04/23/2016, 10:57 AM Pager: [phone]

## 2016-04-24 ENCOUNTER — Inpatient Hospital Stay (HOSPITAL_COMMUNITY): Payer: Medicaid Other

## 2016-04-24 LAB — URINALYSIS, ROUTINE W REFLEX MICROSCOPIC
BILIRUBIN URINE: NEGATIVE
Glucose, UA: NEGATIVE mg/dL
HGB URINE DIPSTICK: NEGATIVE
KETONES UR: NEGATIVE mg/dL
Leukocytes, UA: NEGATIVE
Nitrite: NEGATIVE
Protein, ur: NEGATIVE mg/dL
SPECIFIC GRAVITY, URINE: 1.016 (ref 1.005–1.030)
pH: 6 (ref 5.0–8.0)

## 2016-04-24 IMAGING — DX DG ABDOMEN 1V
1 series · 1 of 1 positions shown · non-contrast
Comparison: [DATE] abdominal radiograph

CLINICAL DATA: Foreign body ingestion.

EXAM:
ABDOMEN - 1 VIEW

[abdomen kub]
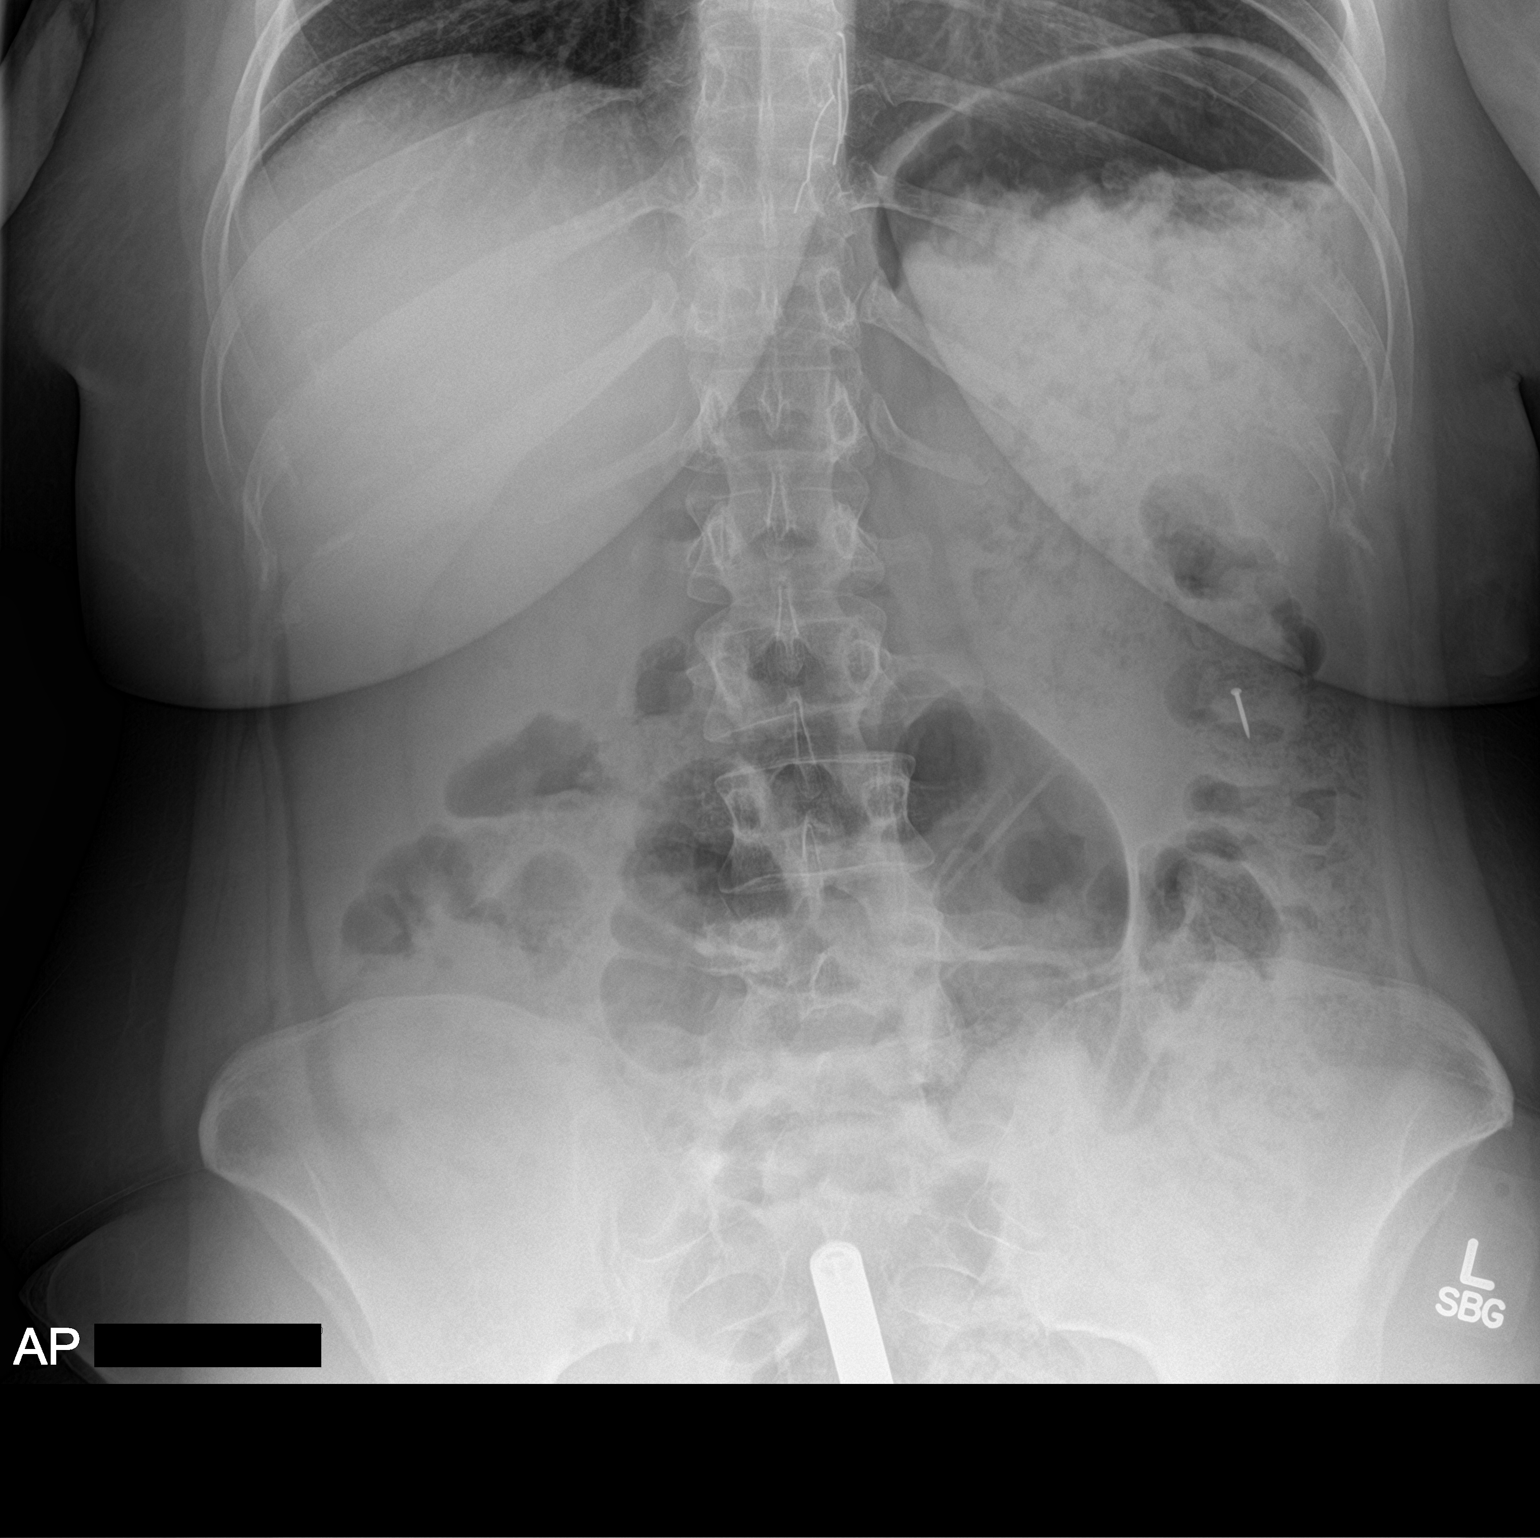

[1 of 1 positions shown; findings below may reference images not displayed]

FINDINGS: A 5 cm cylindrical metallic foreign body overlies the mid sacrum,
not significantly changed in position. A 14 mm linear metallic
structure overlies the descending colon in the left mid abdomen with
minimal inferior migration in the interval. There are 3 clustered
linear metallic structures overlying the region of the lower
thoracic esophagus, not appreciably changed in position. No dilated
small bowel loops. Moderate colonic stool volume. No evidence of
pneumatosis or pneumoperitoneum. Clear lung bases.
IMPRESSION: 1. Nonobstructive bowel gas pattern. No evidence of
pneumoperitoneum.
2. Cylindrical 5 cm metallic foreign body overlies the mid sacrum,
not appreciably changed in position, which could be located within a
small or large bowel loop.
3. Linear 14 mm metallic structure overlies the descending colon in
the left mid abdomen, with minimal inferior migration in the
interval.
4. Three clustered linear metallic structures overlying the region
in the lower thoracic esophagus, not appreciably changed in
position.

## 2016-04-24 MED ORDER — ONDANSETRON HCL 4 MG/2ML IJ SOLN
4.0000 mg | Freq: Four times a day (QID) | INTRAMUSCULAR | Status: DC | PRN
  Administered 2016-04-24 – 2016-04-25 (×3): 4 mg via INTRAVENOUS
  Filled 2016-04-24 (×3): qty 2

## 2016-04-24 NOTE — Progress Notes (Signed)
Paged this evening around 1815 about patient having several episodes of emesis. Patient reports beginning to feel nauseated and stopped eating. Shortly after she began vomiting. Nursing staff reports 6 episodes of emesis consisting of pink, yellow, reddish fluid. Staff reports she was eating strawberries and tomatoes earlier this evening. Patient denies any abdominal pain.   Patient had Abdominal XR this afternoon at 1400 which showed no evidence of pneumoperitoneum. Nonobstructive bowel gas pattern. Still with retained foreign body (battern) in mid sacrum and (tack) overlying the descending colon in the left mid abdomen.   T - 97.6 P - 80 BP- 129/74 O2 sats - 98% on RA  General: Well appearing, sitting upright in bed, NAD Cardio: RRR, no m/r/g Pulm: CTAB Abdomen: Soft, non-tender, obese abdomen, BS+  Plan No concerns for pneumoperitoneum given normal abdominal exam. Does not appear to be true hematemsis but rather red in color from her meal. Will treat nausea with Zofran. If patient develops abdominal pain or change in vitals, will obtain a STAT abdominal XR. Continue to monitor.

## 2016-04-24 NOTE — Progress Notes (Signed)
   Subjective: Overnight she complained of suprapubic abdominal pain, dysuria, nausea, and had one episode of non-bloody emesis. Repeat abdominal x-ray showing no evidence of free air. Battery involving the right pelvis and tack in the LUQ are uncharged from prior. This morning, patient is complaining of suprapubic abdominal pain and dysuria. States she had a BM but did not pass any objects. No other complaints.   Objective:  Vital signs in last 24 hours: Vitals:   04/23/16 0459 04/23/16 1447 04/23/16 2109 04/24/16 0723  BP: 110/74 116/68 124/81 108/67  Pulse: 84 83 81 68  Resp: 18 18  16   Temp: 98.9 F (37.2 C) 97.9 F (36.6 C) 98 F (36.7 C) 97.8 F (36.6 C)  TempSrc: Oral Oral Oral Oral  SpO2:  100% 98% 98%  Weight:      Height:       Physical Exam  Constitutional: She is oriented to person, place, and time. She appears well-developed and well-nourished.  HENT:  Head: Normocephalic and atraumatic.  Cardiovascular: Normal rate and regular rhythm.  Exam reveals no gallop and no friction rub.   No murmur heard. Respiratory: Effort normal and breath sounds normal. No respiratory distress. She has no wheezes.  GI: Soft. Bowel sounds are normal. She exhibits no distension. There is no rebound and no guarding.  Mild suprapubic tenderness.  Musculoskeletal: She exhibits no edema.  Neurological: She is alert and oriented to person, place, and time.  Skin: Skin is warm and dry.     Assessment/Plan:  Active Problems:   Hypothyroidism   Tobacco use disorder   Foreign body ingestion   Swallowed foreign body   1. Foreign body ingestion 1 battery removed by GI on EGD. Overnight she complained of suprapubic abdominal pain, dysuria, nausea, and had one episode of non-bloody emesis. Repeat abdominal x-ray showing no evidence of free air. Battery involving the right pelvis and tack in the LUQ are uncharged from prior. This morning, patient continues to complain of suprapubic abdominal  pain and dysuria. [name] negative for signs of infection. Repeat KUB this morning showing nonobstructive bowel gas pattern. No evidence of pneumoperitoneum. Cylindrical 5 cm metallic foreign body overlying the mid sacrum, not appreciably changed in position, which could be located within a small or large bowel loop. Also showing a linear 14 mm metallic structure overlying the descending colon in the left mid abdomen, with minimal inferior migration in the interval. Three clustered linear metallic structures overlying the region in the lower thoracic esophagus, not appreciably changed in position. -- Wait for objects to pass  2. Schizoaffective disorder Patient is IVC. Psych has been consulted, awaiting recs.   -- Appreciate psych recs -- Currently holding psychiatric medications until we have confirmation of the exact regimen   3. Hypertension Currently normotensive. -- Hold home amlodipine  4.Hypothyroidism  TSH checked during this hospitalization is within normal limits.  -- synthroid 75 mg daily.   5. Tobacco Use -- Nicotine patch  DVT/PE prophylaxis: Lovenox FEN/GI: Normal diet  Dispo: Anticipated discharge following passage of objects and clearance by psychiatry.  Shela Leff, MD 04/24/2016, 12:29 PM Pager: [phone]

## 2016-04-25 ENCOUNTER — Other Ambulatory Visit: Payer: Self-pay

## 2016-04-25 ENCOUNTER — Inpatient Hospital Stay (HOSPITAL_COMMUNITY): Payer: Medicaid Other

## 2016-04-25 DIAGNOSIS — Z79899 Other long term (current) drug therapy: Secondary | ICD-10-CM

## 2016-04-25 DIAGNOSIS — T189XXA Foreign body of alimentary tract, part unspecified, initial encounter: Secondary | ICD-10-CM

## 2016-04-25 IMAGING — DX DG ABDOMEN 1V
2 series · 2 of 2 positions shown · non-contrast
Comparison: Radiographs dated [DATE]

CLINICAL DATA: Ingestion of foreign bodies

EXAM:
ABDOMEN - 1 VIEW

[t abdomen supine (1 of 2)]
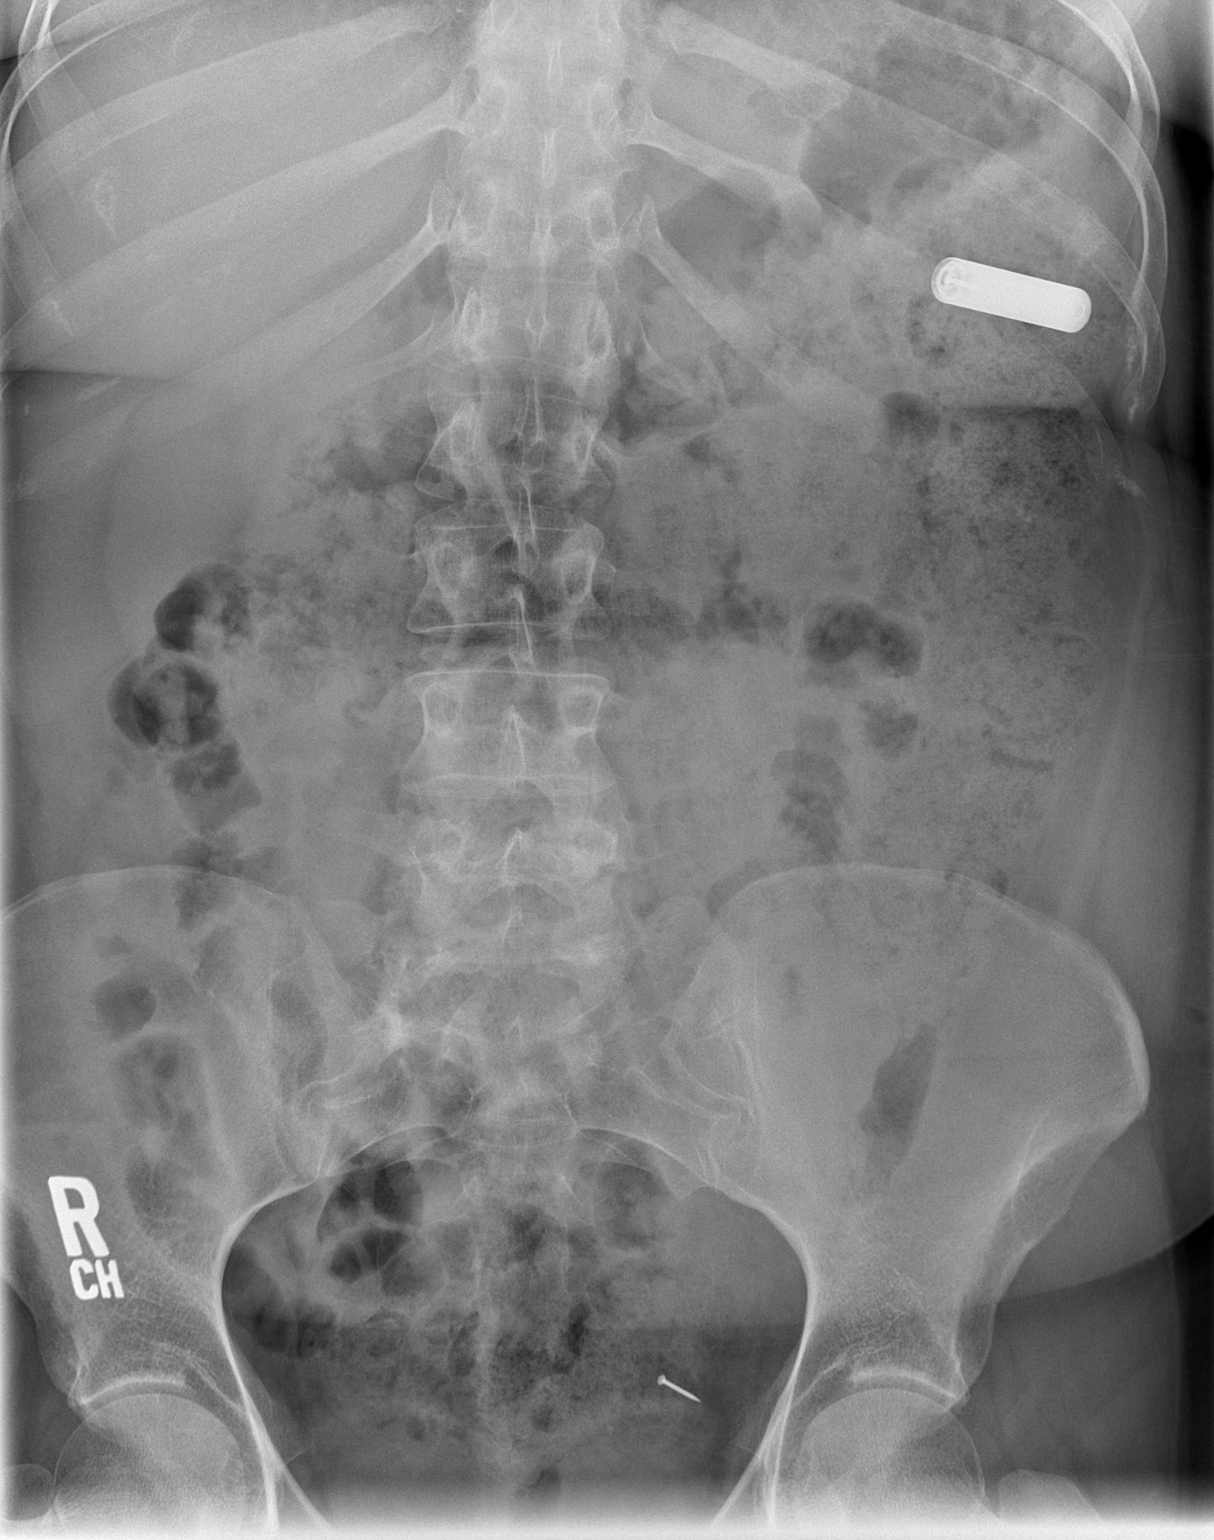

[t abdomen supine (2 of 2)]
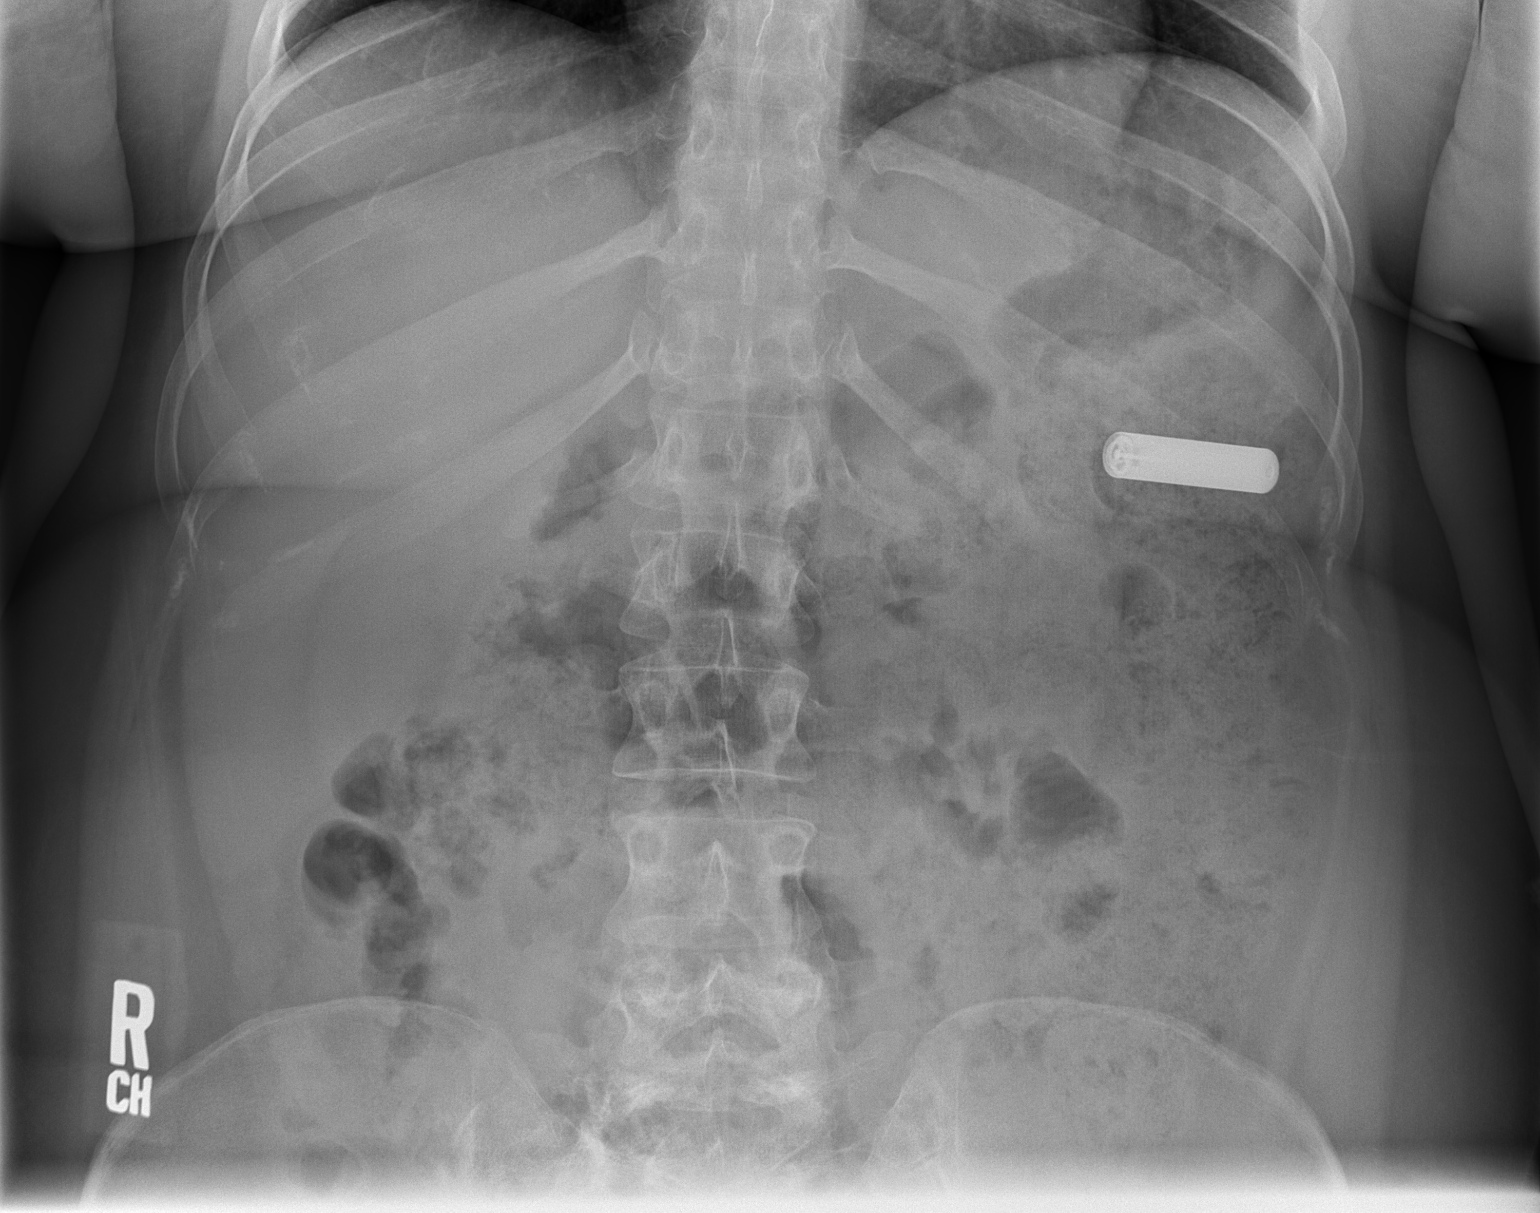

[2 of 2 positions shown; findings below may reference images not displayed]

FINDINGS: There is a battery in the region of the splenic flexure of the
colon. There is a small nail in the left side of the pelvis which
could be in small bowel more distal colon. Wire fragments are noted
in the distal esophagus. These have migrated proximal since the
prior study.
IMPRESSION: 1. Battery in the splenic flexure of the colon region.
2. Small nail in the left side of the pelvis.
3. Wire fragments in the esophagus have migrated proximally.

## 2016-04-25 MED ORDER — VENLAFAXINE HCL ER 75 MG PO CP24
150.0000 mg | ORAL_CAPSULE | Freq: Every day | ORAL | Status: DC
  Administered 2016-04-25 – 2016-05-02 (×8): 150 mg via ORAL
  Filled 2016-04-25 (×8): qty 2

## 2016-04-25 MED ORDER — LITHIUM CARBONATE ER 300 MG PO TBCR
300.0000 mg | EXTENDED_RELEASE_TABLET | Freq: Two times a day (BID) | ORAL | Status: DC
  Administered 2016-04-25 – 2016-05-02 (×14): 300 mg via ORAL
  Filled 2016-04-25 (×14): qty 1

## 2016-04-25 MED ORDER — MECLIZINE HCL 25 MG PO TABS
25.0000 mg | ORAL_TABLET | Freq: Three times a day (TID) | ORAL | Status: DC | PRN
  Administered 2016-04-25: 25 mg via ORAL
  Filled 2016-04-25 (×2): qty 1

## 2016-04-25 NOTE — Progress Notes (Signed)
Pt with n/v this am. Will order EKG to evaluate QTc as she has several medications known to prolong this cardiac interval. If normal will start meclizine for dizziness a/w n/v.

## 2016-04-25 NOTE — Progress Notes (Signed)
   Subjective:  Had several episodes of NBNB emesis yesterday afternoon. Was not having abdominal pain. No signs of pneumoperitoneum. Was given Zofran with improvement of symptoms. No more emesis overnight. Still c/o of nausea.   Objective:  Vital signs in last 24 hours: Vitals:   04/24/16 0723 04/24/16 1705 04/24/16 1829 04/25/16 0501  BP: 108/67 122/71 129/74 (!) 112/55  Pulse: 68 70 80 71  Resp: 16 15  18   Temp: 97.8 F (36.6 C) 98.3 F (36.8 C) 97.6 F (36.4 C) 98.8 F (37.1 C)  TempSrc: Oral Oral Oral Oral  SpO2: 98% 99% 98% 97%  Weight:      Height:       Physical Exam  Constitutional: She is oriented to person, place, and time. She appears well-developed and well-nourished.  HENT:  Head: Normocephalic and atraumatic.  Cardiovascular: Normal rate and regular rhythm.  Exam reveals no gallop and no friction rub.   No murmur heard. Respiratory: Effort normal and breath sounds normal. No respiratory distress. She has no wheezes.  GI: Soft. Bowel sounds are normal. She exhibits no distension. There is no rebound and no guarding.  Mild suprapubic tenderness.  Musculoskeletal: She exhibits no edema.  Neurological: She is alert and oriented to person, place, and time.  Skin: Skin is warm and dry.     Assessment/Plan:  Active Problems:   Hypothyroidism   Tobacco use disorder   Foreign body ingestion   Swallowed foreign body   1. Foreign body ingestion 1 battery removed by GI. Continues to have single battery and tac within bowel. Several episodes of NBNB emesis yesterday afternoon. Repeat abdominal x-ray w/o signs of pneumoperitoneum. Non-obstructing bowel gas pattern. -- Wait for objects to pass -- radiopaque object in the oesophagus. On clarification these are old metal objects that she pushed superficially into her chest in the past when she was stressed. They are not foreign bodies located within the GI tract.  2. Schizoaffective disorder -- Appreciate psych  recs -- Will f/u with facility regarding lithium dose. Restarted other psych meds  3. Hypertension Currently normotensive. -- Hold home amlodipine  4.Hypothyroidism  TSH checked during this hospitalization is within normal limits.  -- synthroid 75 mg daily.   5. Tobacco Use -- Nicotine patch  DVT/PE prophylaxis: Lovenox FEN/GI: Normal diet  Dispo: Anticipated discharge following passage of objects and clearance by psychiatry.  Ophelia Shoulder, MD 04/25/2016, 8:01 AM Pager: [phone]

## 2016-04-25 NOTE — Care Management Note (Signed)
Case Management Note  Patient Details  [name] MRN: [id number] Date of Birth: 01-27-1991  Subjective/Objective:                    Action/Plan:  Awaiting pysch recommendations , spoke with Dr Lovena Le . Pysch will see patient tomorrow morning and enter note 04-26-16 Expected Discharge Date:                  Expected Discharge Plan:     In-House Referral:  Clinical Social Work  Discharge planning Services  CM Consult  Post Acute Care Choice:    Choice offered to:     DME Arranged:    DME Agency:     HH Arranged:    [facility] Agency:     Status of Service:  In process, will continue to follow  If discussed at Long Length of Stay Meetings, dates discussed:    Additional Comments:  Marilu Favre, RN 04/25/2016, 3:13 PM

## 2016-04-25 NOTE — Progress Notes (Signed)
I spoke with a nurse from the patients psychiatry office. She confirmed the patient's psych medications including lithium 300 mg by mouth twice a day and Effexor 150 mg. She has been difficult to manage from a psychiatry perspective. Because she had good control of her psychosis with these medications I have restarted them.

## 2016-04-26 DIAGNOSIS — T18198D Other foreign object in esophagus causing other injury, subsequent encounter: Secondary | ICD-10-CM

## 2016-04-26 DIAGNOSIS — Z8489 Family history of other specified conditions: Secondary | ICD-10-CM

## 2016-04-26 DIAGNOSIS — Z818 Family history of other mental and behavioral disorders: Secondary | ICD-10-CM

## 2016-04-26 DIAGNOSIS — Z8249 Family history of ischemic heart disease and other diseases of the circulatory system: Secondary | ICD-10-CM

## 2016-04-26 LAB — BASIC METABOLIC PANEL
Anion gap: 7 (ref 5–15)
BUN: 11 mg/dL (ref 6–20)
CALCIUM: 9.8 mg/dL (ref 8.9–10.3)
CO2: 23 mmol/L (ref 22–32)
CREATININE: 0.82 mg/dL (ref 0.44–1.00)
Chloride: 108 mmol/L (ref 101–111)
GFR calc Af Amer: >60 mL/min (ref >60)
GLUCOSE: 161 mg/dL — AB (ref 65–99)
Potassium: 4.3 mmol/L (ref 3.5–5.1)
Sodium: 138 mmol/L (ref 135–145)
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
## 2016-04-27 ENCOUNTER — Inpatient Hospital Stay (HOSPITAL_COMMUNITY): Payer: Medicaid Other

## 2016-04-27 DIAGNOSIS — K5900 Constipation, unspecified: Secondary | ICD-10-CM

## 2016-04-27 IMAGING — DX DG ABDOMEN 1V
1 series · 1 of 1 positions shown · non-contrast
Comparison: [DATE]

CLINICAL DATA: Foreign bodies within the bowel

EXAM:
ABDOMEN - 1 VIEW

[t abdomen supine]
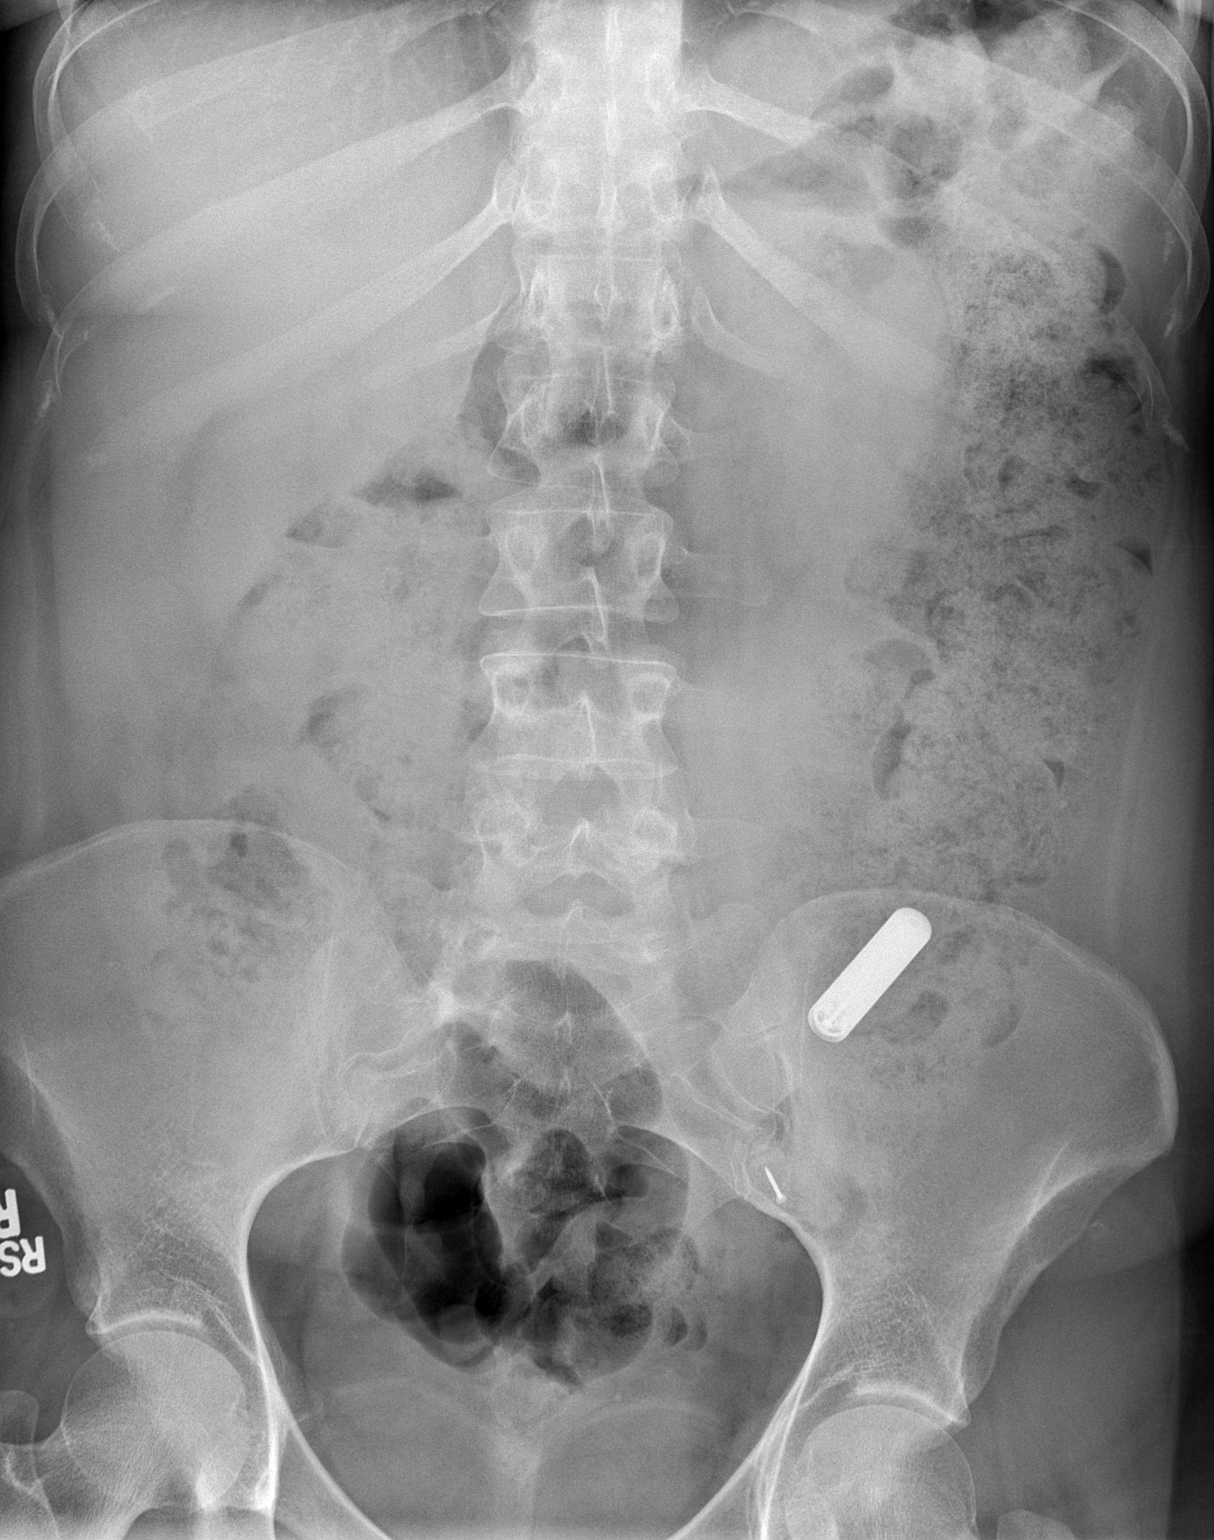

[1 of 1 positions shown; findings below may reference images not displayed]

FINDINGS: There is a battery which currently is located in the distal
descending colon. A a metallic screw is noted in the proximal
sigmoid colon. There is diffuse stool throughout the colon. No bowel
dilatation or air-fluid level evident to suggest bowel obstruction.
No free air evident.
IMPRESSION: The battery has progressed to the distal descending colon. The
metallic screw remains in the proximal sigmoid colon region. No free
air. Diffuse stool throughout colon suggestive of underlying
constipation. No bowel obstruction.

## 2016-04-27 MED ORDER — POLYETHYLENE GLYCOL 3350 17 GM/SCOOP PO POWD
1.0000 | Freq: Once | ORAL | Status: AC
  Administered 2016-04-27: 255 g via ORAL
  Filled 2016-04-27: qty 255

## 2016-04-27 NOTE — Progress Notes (Signed)
   Subjective:  Doing well. No n/v or abdominal pain. She has not had a BM this morning. She is tired of being in the hospital.   Objective:  Vital signs in last 24 hours: Vitals:   04/26/16 1024 04/26/16 1325 04/26/16 2117 04/27/16 0513  BP: 120/71 120/79 117/66 (!) 112/52  Pulse: 89 89 72 65  Resp: 18 18 17 18   Temp: 98.7 F (37.1 C) 98.4 F (36.9 C) 98.7 F (37.1 C) 98.1 F (36.7 C)  TempSrc: Oral Oral Oral Oral  SpO2: 99% 99% 98% 100%  Weight:      Height:       Physical Exam  Constitutional: She is oriented to person, place, and time. She appears well-developed and well-nourished.  HENT:  Head: Normocephalic and atraumatic.  Cardiovascular: Normal rate and regular rhythm.  Exam reveals no gallop and no friction rub.   No murmur heard. Respiratory: Effort normal and breath sounds normal. No respiratory distress. She has no wheezes.  GI: Soft. Bowel sounds are normal. She exhibits no distension. There is no rebound and no guarding.  No pain to palpation  Musculoskeletal: She exhibits no edema.  Neurological: She is alert and oriented to person, place, and time.  Skin: Skin is warm and dry.     Assessment/Plan:  Active Problems:   Hypothyroidism   Tobacco use disorder   Foreign body ingestion   Swallowed foreign body   FB GI (foreign body in gastrointestinal tract)  In summary, pt doing well w/o n/v or abdominal pain. Will repeat x-ray today to evaluate for passage. We will continue to monitor clinically for signs suggestive of perforation.    1. Foreign body ingestion 1 battery removed by GI. Continues to have single battery and tac within bowel. No additional n/v. Symptoms improved from prior. No abdominal pain. If pain or n/v return will consult GI for further recs.   -- Wait for objects to pass -- radiopaque object in the oesophagus. On clarification these are old metal objects that she pushed superficially into her chest in the past when she was stressed.  They are not foreign bodies located within the GI tract. -- Will repeat abdominal radiograph today to evaluate location of foreign bodies -- Spoke with gastroenterology who recommended starting the patient on a MiraLAX bowel prep for constipation noted on recent abdominal radiograph and to assist with passage of the objects  2. Schizoaffective disorder -- Appreciate psych recs -- restarted lithium yesterday evening. Currently, on all her psych meds. -- Psychiatry has been consulted and will see the patient today regarding IVC.   3. Hypertension Currently normotensive. -- Hold home amlodipine  4.Hypothyroidism  TSH checked during this hospitalization is within normal limits.  -- synthroid 75 mg daily.   5. Tobacco Use -- Nicotine patch  DVT/PE prophylaxis: Lovenox FEN/GI: Normal diet  Dispo: Anticipated discharge following passage of objects and clearance by psychiatry.  Ophelia Shoulder, MD 04/27/2016, 7:34 AM Pager: [phone]
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
## 2016-04-28 ENCOUNTER — Encounter (HOSPITAL_COMMUNITY): Payer: Self-pay | Admitting: General Practice

## 2016-04-28 ENCOUNTER — Inpatient Hospital Stay (HOSPITAL_COMMUNITY): Payer: Medicaid Other
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
## 2016-04-28 NOTE — Progress Notes (Signed)
Safety sitter reported to this RN that patient had BM on hat.  Checked BM on hat and noted it loose and some formed brown stools but no signs of battery or screw.

## 2016-04-28 NOTE — Progress Notes (Signed)
   Subjective:  Doing well. No n/v or abdominal pain. Drank miralax prep today, 1 BM since, loose stool. No other complaints. Psych c/s yesterday and cleared for safety and w/o indication for IVC at this time.  Objective:  Vital signs in last 24 hours: Vitals:   04/27/16 0513 04/27/16 1438 04/27/16 2112 04/28/16 0604  BP: (!) 112/52 114/67 114/70 122/61  Pulse: 65 85 78 74  Resp: 18 18 16 18   Temp: 98.1 F (36.7 C) 98 F (36.7 C) 98.2 F (36.8 C) 98 F (36.7 C)  TempSrc: Oral Oral Oral Oral  SpO2: 100% 100% 98% 98%  Weight:      Height:       Physical Exam  Constitutional: She is oriented to person, place, and time. She appears well-developed and well-nourished.  HENT:  Head: Normocephalic and atraumatic.  Cardiovascular: Normal rate and regular rhythm.  Exam reveals no gallop and no friction rub.   No murmur heard. Respiratory: Effort normal and breath sounds normal. No respiratory distress. She has no wheezes.  GI: Soft. Bowel sounds are normal. She exhibits no distension. There is no rebound and no guarding.  No pain to palpation  Musculoskeletal: She exhibits no edema.  Neurological: She is alert and oriented to person, place, and time.  Skin: Skin is warm and dry.   Assessment/Plan:  Active Problems:   Hypothyroidism   Tobacco use disorder   Foreign body ingestion   Swallowed foreign body   FB GI (foreign body in gastrointestinal tract)  In summary, pt doing well w/o n/v or abdominal pain. Will repeat x-ray tomorrow to evaluate for passage. Using bulking agent to facilitate passage of objects. We will continue to monitor clinically for signs suggestive of perforation.   1. Foreign body ingestion 1 battery removed by GI. Continues to have single battery and tac within bowel. No n/v pain. Pt completed 225g Miralax bowel prep for constipation and to facilitate passage. -- Wait for objects to pass -- Repeat abdominal radiograph daily  2. Schizoaffective  disorder -- Appreciate psych recs:  - d/c sitter, pt contracted to safety while in hospital  - d/c IVC at discharge -- restarted lithium, currently, on all her psych meds  3. Hypertension Currently normotensive. -- Hold home amlodipine  4.Hypothyroidism  TSH checked during this hospitalization is within normal limits.  -- synthroid 75 mg daily.   5. Tobacco Use -- Nicotine patch  DVT/PE prophylaxis: Lovenox FEN/GI: Normal diet  Dispo: Anticipated discharge following passage of objects.  Holley Raring, MD 04/28/2016, 1:16 PM Pager: [phone]

## 2016-04-28 NOTE — Progress Notes (Signed)
CNA reported medium BM on hat. This RN scoured the BM on hat and noted brown loose with some formed stools but noted no sign of battery.  Will continue to monitor.

## 2016-04-28 NOTE — Progress Notes (Signed)
Safety sitter reported to this RN that patient had another BM.  Inspected and scoured hat but no foreign body noted, only small brown, yellow form stool mixed with urine.

## 2016-04-29 ENCOUNTER — Inpatient Hospital Stay (HOSPITAL_COMMUNITY): Payer: Medicaid Other

## 2016-04-29 IMAGING — DX DG ABDOMEN 1V
1 series · 1 of 1 positions shown · non-contrast
Comparison: [DATE]; [DATE]; [DATE]

CLINICAL DATA: Swallowed battery and tack on [DATE].

EXAM:
ABDOMEN - 1 VIEW

[abdomen kub]
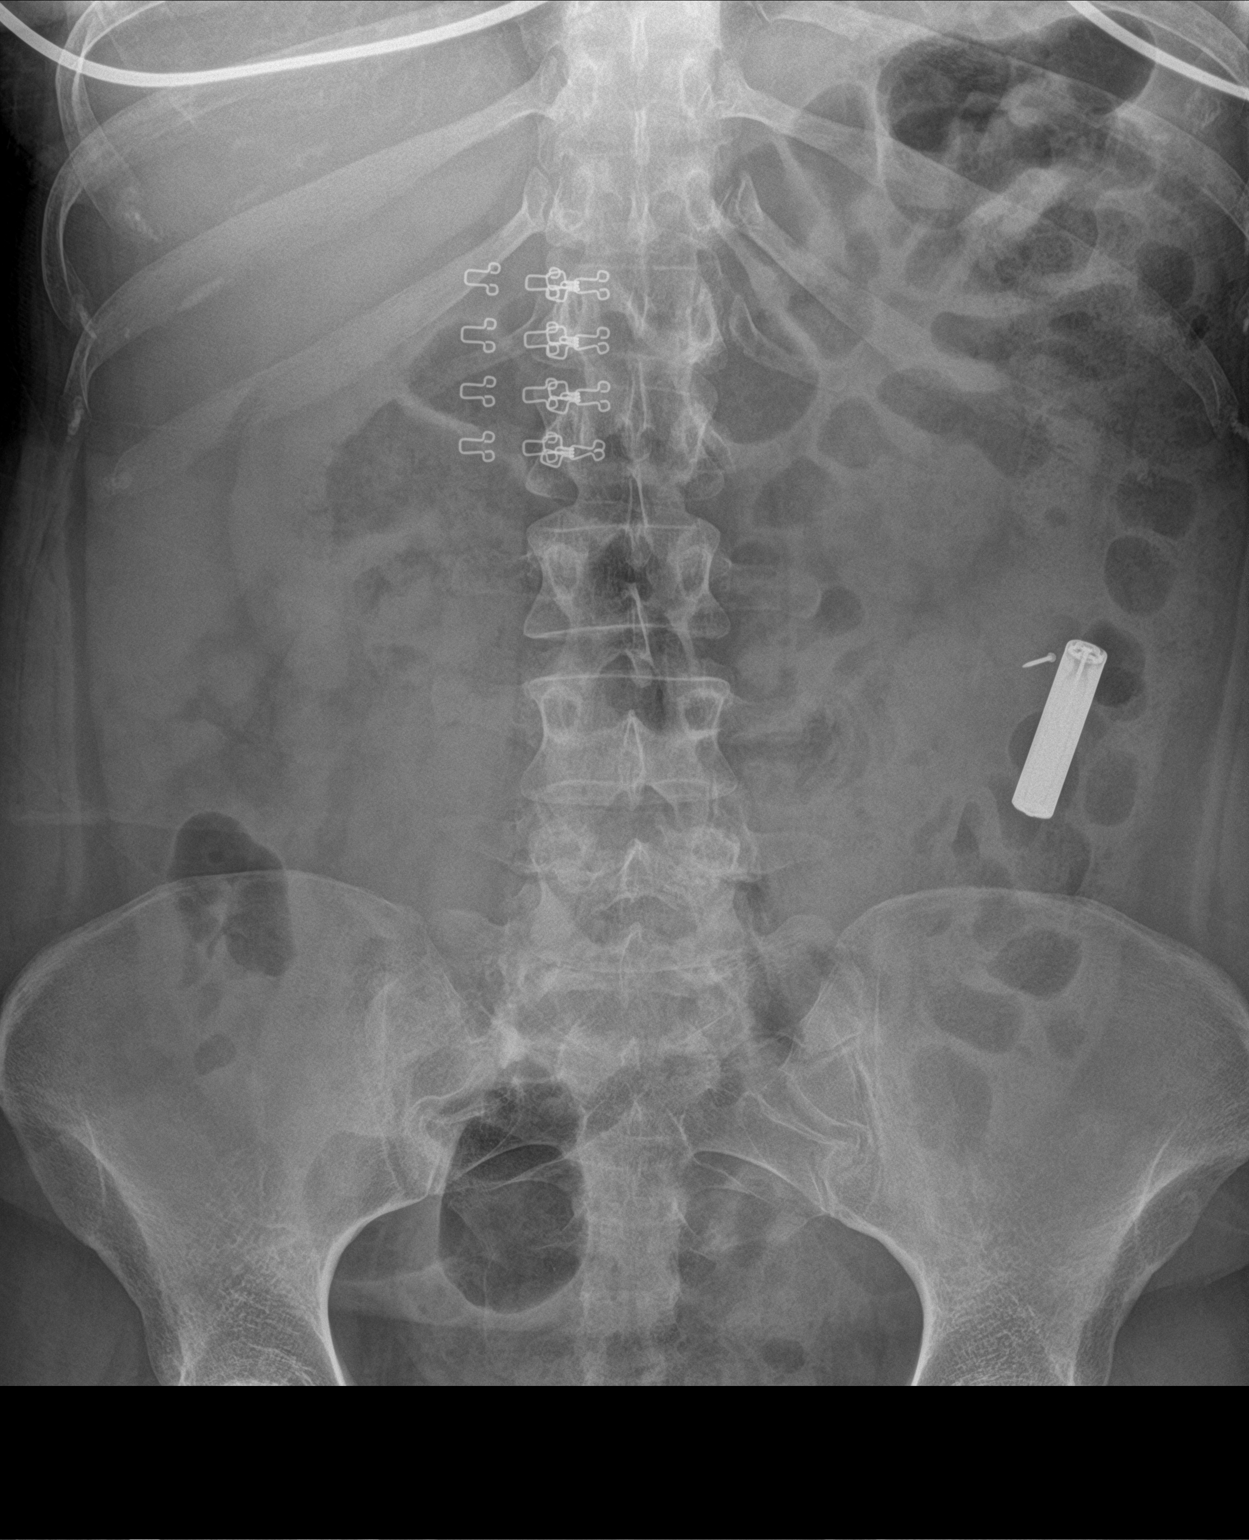

[1 of 1 positions shown; findings below may reference images not displayed]

FINDINGS: The remaining battery and tack overlie the expected location of the
mid aspect of the descending colon.

Nondiagnostic evaluation for pneumoperitoneum secondary to supine
positioning exclusion of the lower thorax. No definite pneumatosis
or portal venous gas.

Paucity of bowel gas without evidence of enteric obstruction.
Unremarkable colonic stool burden.

Degenerative change of the lower lumbar spine is suspected though
incompletely evaluated.
IMPRESSION: Battery and YUMI overlie the expected location of the descending
colon.

## 2016-04-29 NOTE — Progress Notes (Signed)
   Subjective:  No acute events overnight. Denies n/v and abdominal pain. No new issues this morning. Had several BM  Overnight. Searched by nursing staff w/o evidence of battery or tac. Repeat imaging from this AM shows both objects within the descending colon.   Objective:  Vital signs in last 24 hours: Vitals:   04/28/16 0604 04/28/16 1328 04/28/16 2058 04/29/16 0600  BP: 122/61 107/71 120/74 113/64  Pulse: 74 80 76 77  Resp: 18 17 16 16   Temp: 98 F (36.7 C) 98.2 F (36.8 C) 98.7 F (37.1 C) 98.5 F (36.9 C)  TempSrc: Oral Oral Oral Oral  SpO2: 98% 98% 97% 98%  Weight:      Height:       Physical Exam  Constitutional: She is oriented to person, place, and time. She appears well-developed and well-nourished.  HENT:  Head: Normocephalic and atraumatic.  Cardiovascular: Normal rate and regular rhythm.  Exam reveals no gallop and no friction rub.   No murmur heard. Respiratory: Effort normal and breath sounds normal. No respiratory distress. She has no wheezes.  GI: Soft. Bowel sounds are normal. She exhibits no distension. There is no rebound and no guarding.  No pain to palpation  Musculoskeletal: She exhibits no edema.  Neurological: She is alert and oriented to person, place, and time.  Skin: Skin is warm and dry.   Assessment/Plan:  Active Problems:   Hypothyroidism   Tobacco use disorder   Foreign body ingestion   Swallowed foreign body   FB GI (foreign body in gastrointestinal tract)  In summary, pt doing well w/o n/v or abdominal pain. Repeat imaging from this AM shows the battery and tac within the descending colon. We will continue to monitor clinically for passage. Repeat image in 48 hours (Sunday morning) unless there are signs/symptoms concerning for bowel perforation. Had another BM this AM waiting to be searched by staff for objects. Will f/u.   1. Foreign body ingestion 1 battery removed by GI. Continues to have single battery and tac within bowel. No  n/v pain. Pt completed 225g Miralax bowel prep for constipation and to facilitate passage.  -- Wait for objects to pass -- Repeat abdominal radiograph q48 hours unless signs and symptoms concerning for perforation/obstruction/ileus   2. Schizoaffective disorder -- Appreciate psych recs:  - d/c sitter, pt contracted to safety while in hospital  - d/c IVC at discharge -- restarted lithium, currently, on all her psych meds  3. Hypertension Currently normotensive. Most recent BP of 113/64 -- Hold home amlodipine  4.Hypothyroidism  TSH checked during this hospitalization is within normal limits.  -- synthroid 75 mg daily.   5. Tobacco Use -- Nicotine patch  DVT/PE prophylaxis: Lovenox FEN/GI: Normal diet  Dispo: Anticipated discharge following passage of objects.  Ophelia Shoulder, MD 04/29/2016, 8:21 AM Pager: [phone]

## 2016-04-30 DIAGNOSIS — T17908A Unspecified foreign body in respiratory tract, part unspecified causing other injury, initial encounter: Secondary | ICD-10-CM

## 2016-04-30 DIAGNOSIS — T17900A Unspecified foreign body in respiratory tract, part unspecified causing asphyxiation, initial encounter: Secondary | ICD-10-CM

## 2016-04-30 NOTE — Progress Notes (Signed)
   Subjective:  Doing well. No complaints. Last BM at 7PM last night.  Objective:  Vital signs in last 24 hours: Vitals:   04/29/16 0600 04/29/16 1315 04/29/16 2100 04/30/16 0613  BP: 113/64 132/78 116/68 118/63  Pulse: 77 75 85 64  Resp: 16 18 17 18   Temp: 98.5 F (36.9 C) 98.6 F (37 C) 98.9 F (37.2 C) 98.6 F (37 C)  TempSrc: Oral Oral Oral Oral  SpO2: 98% 96% 100% 100%  Weight:      Height:       Physical Exam  Constitutional: She is oriented to person, place, and time. She appears well-developed and well-nourished.  HENT:  Head: Normocephalic and atraumatic.  Cardiovascular: Normal rate and regular rhythm.  Exam reveals no gallop and no friction rub.   No murmur heard. Respiratory: Effort normal and breath sounds normal. No respiratory distress. She has no wheezes.  GI: Soft. Bowel sounds are normal. She exhibits no distension. There is no rebound and no guarding.  No pain to palpation  Musculoskeletal: She exhibits no edema.  Neurological: She is alert and oriented to person, place, and time.  Skin: Skin is warm and dry.   Assessment/Plan:  Active Problems:   Hypothyroidism   Tobacco use disorder   Foreign body ingestion   Swallowed foreign body   FB GI (foreign body in gastrointestinal tract)  In summary, pt doing well w/o n/v or abdominal pain. Repeat imaging from this AM shows the battery and tac within the descending colon. We will continue to monitor clinically for passage. Repeat image in 48 hours (Sunday morning) unless there are signs/symptoms concerning for bowel perforation. Had another BM this AM waiting to be searched by staff for objects. Will f/u.   1. Foreign body ingestion 1 battery removed by GI. Continues to have single battery and tac within bowel. No n/v pain. Pt completed 225g Miralax bowel prep for constipation and to facilitate passage.  -- Wait for objects to pass -- Repeat abdominal radiograph q48 hours unless signs and symptoms  concerning for perforation/obstruction/ileus   2. Schizoaffective disorder -- Appreciate psych recs:  - d/c IVC at discharge -- On all home psych meds  3. Hypertension Stable BPs. -- Hold home amlodipine  4.Hypothyroidism  -- synthroid 75 mg daily.   5. Tobacco Use -- Nicotine patch  DVT/PE prophylaxis: Lovenox FEN/GI: Normal diet  Dispo: Anticipated discharge following passage of objects.  Holley Raring, MD 04/30/2016, 9:02 AM Pager: [phone]

## 2016-04-30 NOTE — Progress Notes (Signed)
Had a bm today, no foreign object (battery or tack) seen.

## 2016-04-30 NOTE — Clinical Social Work Note (Signed)
CSW will facilitate group home placement once passage of objects is confirmed and pt is medically cleared. CSW following.   [address], [place]

## 2016-05-01 ENCOUNTER — Inpatient Hospital Stay (HOSPITAL_COMMUNITY): Payer: Medicaid Other

## 2016-05-01 IMAGING — CR DG ABDOMEN 1V
1 series · 1 of 1 positions shown · non-contrast
Comparison: [DATE]

CLINICAL DATA: Foreign body ingested [DATE]

EXAM:
ABDOMEN - 1 VIEW

[abdomen kub]
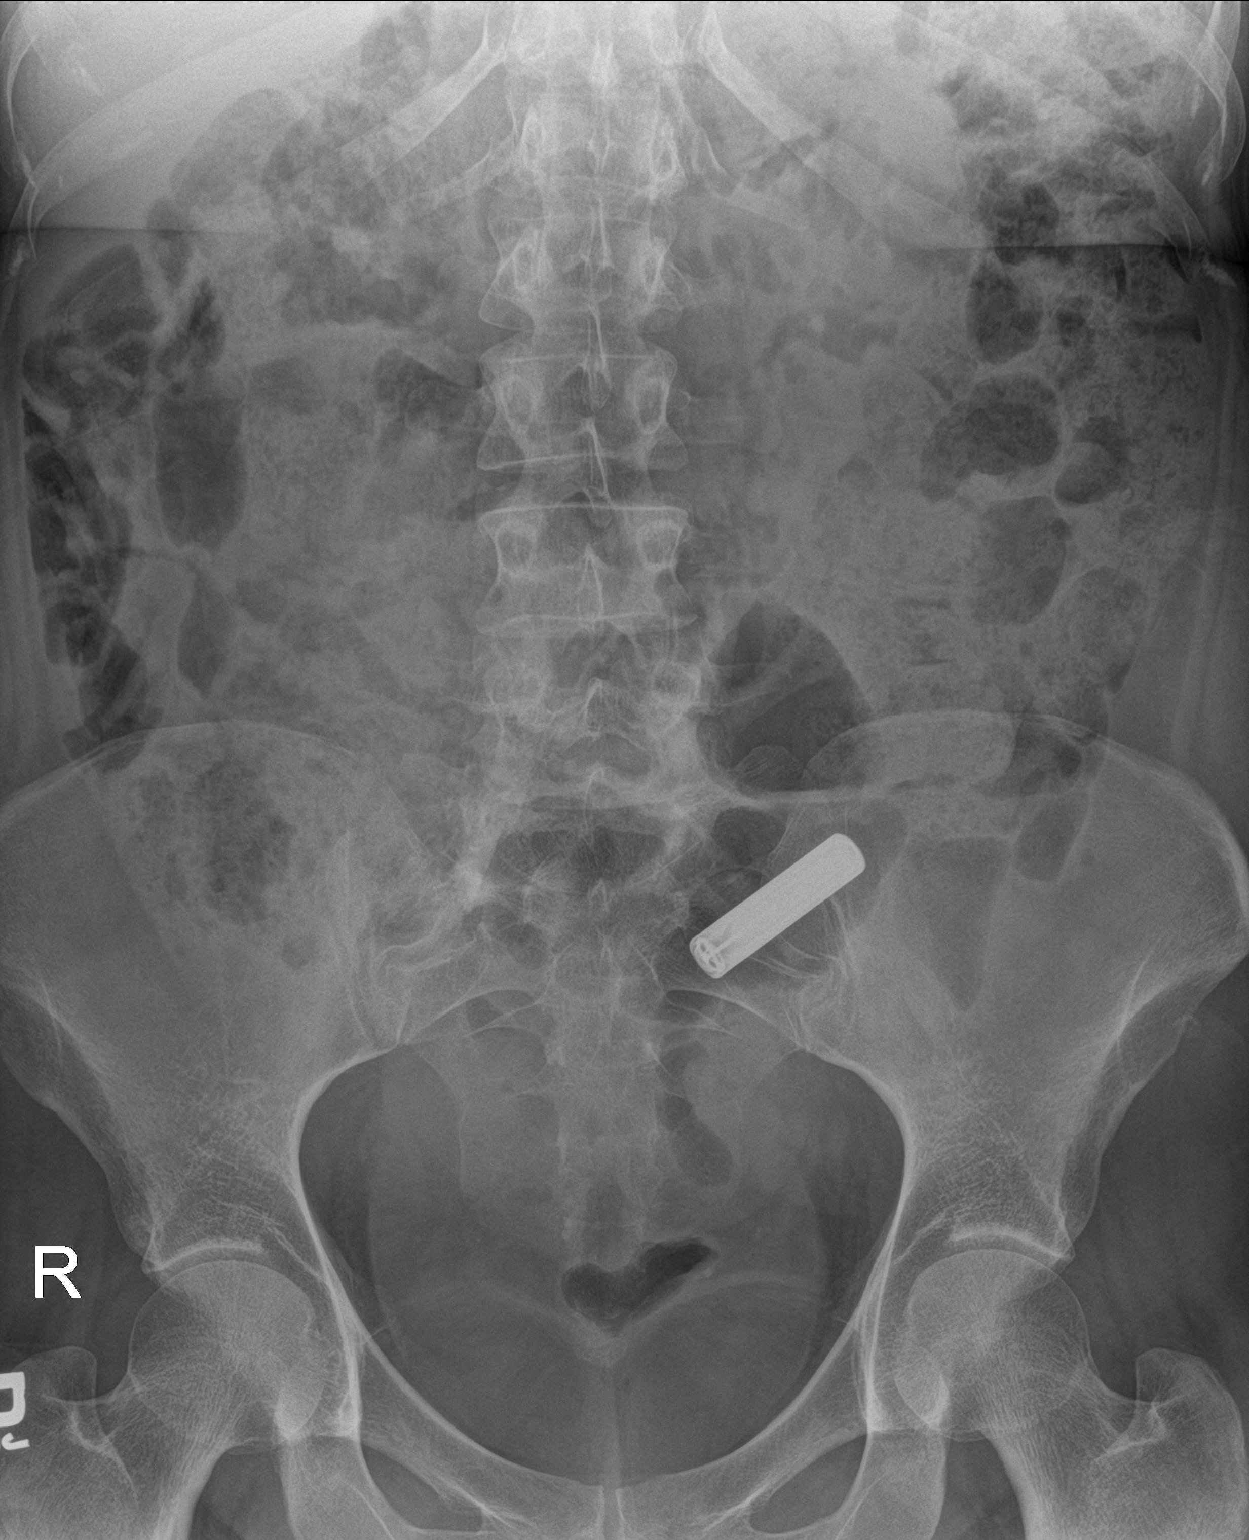

[1 of 1 positions shown; findings below may reference images not displayed]

FINDINGS: The previously seen tack is no longer visualized, likely passed. The
battery is seen in the left lower quadrant, likely in the sigmoid
colon, slightly downstream from prior study. Nonobstructive bowel
gas pattern. Moderate stool burden. No visible free air on this
supine view.
IMPRESSION: Interval passage of the previously seen tack. Battery slightly
downstream from prior study, likely in the sigmoid colon.

## 2016-05-01 IMAGING — DX DG PELVIS 1-2V
1 series · 1 of 1 positions shown · non-contrast
Comparison: [DATE]

CLINICAL DATA: Patient swallowed battery [DATE].

EXAM:
PELVIS - 1-2 VIEW

[pelvis ap]
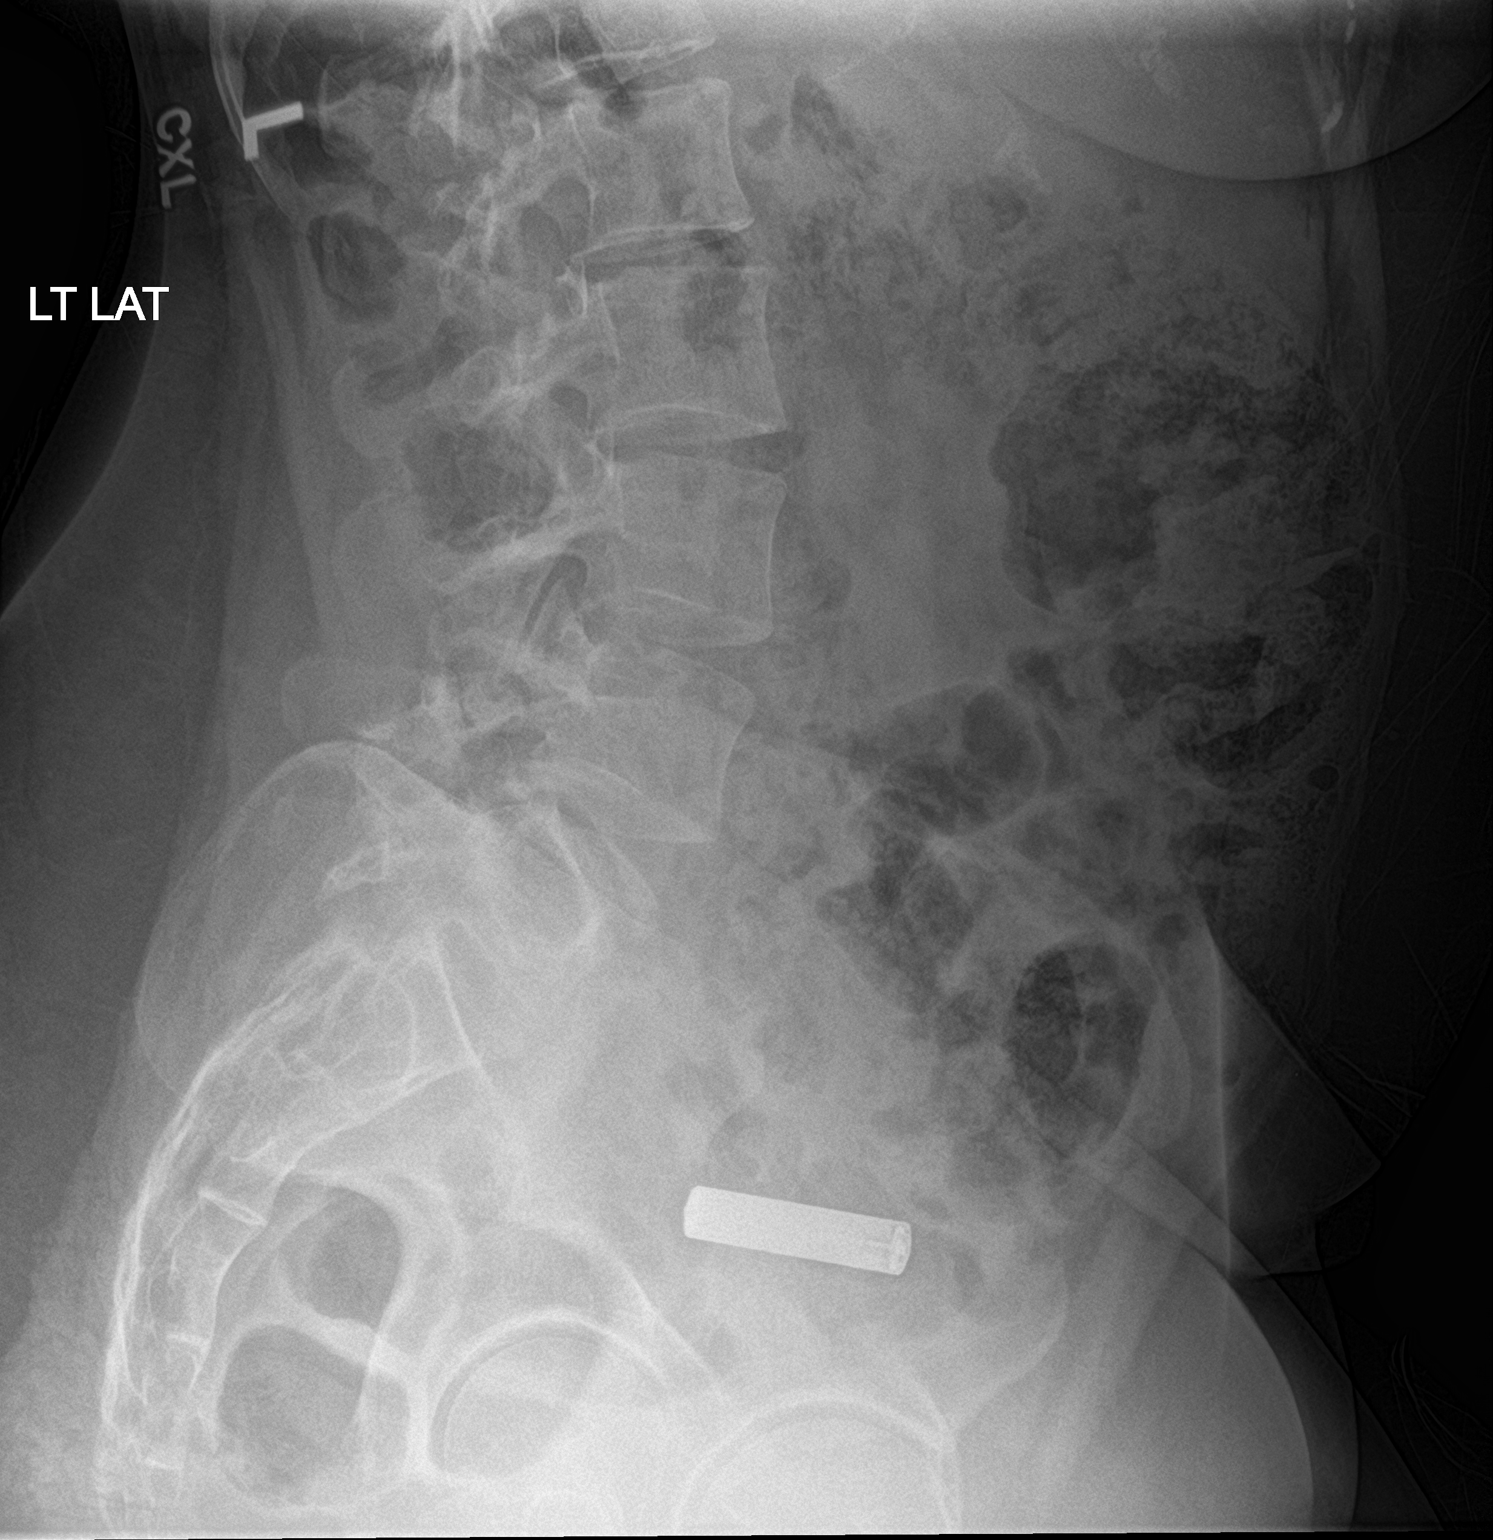

[1 of 1 positions shown; findings below may reference images not displayed]

FINDINGS: The only foreign body identified on this study is the battery which
appears to have passed into the distal sigmoid colon or rectum.
IMPRESSION: Battery in the distal sigmoid colon or rectum as above.

## 2016-05-01 NOTE — Clinical Social Work Note (Signed)
Clinical Social Worker continuing to follow patient and family for support and discharge planning needs.  CSW spoke with Mrs. Watlington from group home, who states that patient can return on Monday, but will need transportation arranged.  CSW updated MD.  CSW remains available for support and to facilitate patient discharge needs once medically stable.  Barbette Or, [place]

## 2016-05-01 NOTE — Discharge Instructions (Signed)
Please follow-up with your primary care provider within 1 week. A repeat abdominal x-ray may have to be done in case you have not passed the battery in your stool.   Please seek immediate medical attention if you start having nausea, vomiting, or abdominal pain.

## 2016-05-01 NOTE — Progress Notes (Signed)
   Subjective:  Unchanged. No complaints.  Objective:  Vital signs in last 24 hours: Vitals:   04/30/16 0613 04/30/16 1349 04/30/16 2101 05/01/16 0512  BP: 118/63 125/68 120/71 108/65  Pulse: 64 65 73 80  Resp: 18 18 18 17   Temp: 98.6 F (37 C) 97.6 F (36.4 C) 98.2 F (36.8 C) 98 F (36.7 C)  TempSrc: Oral Oral Oral Oral  SpO2: 100% 100% 96% 98%  Weight:      Height:       Physical Exam  Constitutional: She is oriented to person, place, and time. She appears well-developed and well-nourished.  HENT:  Head: Normocephalic and atraumatic.  Cardiovascular: Normal rate and regular rhythm.  Exam reveals no gallop and no friction rub.   No murmur heard. Respiratory: Effort normal and breath sounds normal. No respiratory distress. She has no wheezes.  GI: Soft. Bowel sounds are normal. She exhibits no distension. There is no rebound and no guarding.  No pain to palpation  Musculoskeletal: She exhibits no edema.  Neurological: She is alert and oriented to person, place, and time.  Skin: Skin is warm and dry.   Assessment/Plan:  Active Problems:   Hypothyroidism   Tobacco use disorder   Foreign body ingestion   Swallowed foreign body   FB GI (foreign body in gastrointestinal tract)   Foreign body aspiration  In summary, pt doing well w/o n/v or abdominal pain. Repeat imaging from this AM shows the battery and tac within the descending colon. We will continue to monitor clinically for passage. Repeat image in 48 hours (Sunday morning) unless there are signs/symptoms concerning for bowel perforation. Had another BM this AM waiting to be searched by staff for objects. Will f/u.   1. Foreign body ingestion 1 battery removed by GI. Continues to have single battery and tac within bowel. No n/v pain. Pt completed 225g Miralax bowel prep for constipation and to facilitate passage.  -- Wait for objects to pass -- Repeat abdominal radiograph q48 hours unless signs and symptoms  concerning for perforation/obstruction/ileus - f/u abd XR today  2. Schizoaffective disorder -- Appreciate psych recs:  - d/c IVC at discharge -- On all home psych meds  3. Hypertension Stable BPs. -- Hold home amlodipine  4.Hypothyroidism  -- synthroid 75 mg daily.   5. Tobacco Use -- Nicotine patch  DVT/PE prophylaxis: Lovenox FEN/GI: Normal diet  Dispo: Anticipated discharge following passage of objects.  Holley Raring, MD 05/01/2016, 7:57 AM Pager: [phone]

## 2016-05-02 NOTE — Progress Notes (Signed)
Patient will have to go by [facility] back to group home  Patient will discharge to group home Anticipated discharge date: 12/18 Family notified: message left for pt mom Transportation by [facility]- called at 2pm  CSW signing off.  Jorge Ny, LCSW Clinical Social Worker [phone]

## 2016-05-02 NOTE — Discharge Summary (Signed)
[name] MRN: [id number] DOB: 11-09-1990 25 y.o. PCP: Casilda Carls  Date of Admission: 04/21/2016  5:25 PM Date of Discharge: 05/02/2016 Attending Physician: Lucious Groves, DO  Discharge Diagnosis: 1. Foreign body ingestion 2. Schizoaffective disorder Active Problems:   Hypothyroidism   Tobacco use disorder   Foreign body ingestion   Swallowed foreign body   FB GI (foreign body in gastrointestinal tract)   Foreign body aspiration   Discharge Medications: Allergies as of 05/02/2016      Reactions   Betadine [povidone Iodine] Other (See Comments)   Reaction:  Unknown    Iodine Rash   Shellfish-derived Products Other (See Comments)   Reaction:  Unknown       Medication List    STOP taking these medications   polyethylene glycol packet Commonly known as:  MIRALAX / GLYCOLAX     TAKE these medications   amantadine 100 MG capsule Commonly known as:  SYMMETREL Take 1 capsule (100 mg total) by mouth 2 (two) times daily.   amLODipine 2.5 MG tablet Commonly known as:  NORVASC Take 1 tablet (2.5 mg total) by mouth daily.   docusate sodium 100 MG capsule Commonly known as:  COLACE Take 2 capsules (200 mg total) by mouth 2 (two) times daily.   fluPHENAZine 5 MG tablet Commonly known as:  PROLIXIN Take 5-10 mg by mouth See admin instructions. Take 5 mg by mouth in the morning, take 5 mg by mouth in the afternoon and take 10 mg by mouth at bedtime.   LATUDA 120 MG Tabs Generic drug:  Lurasidone HCl Take 120 mg by mouth every evening.   levothyroxine 75 MCG tablet Commonly known as:  SYNTHROID, LEVOTHROID Take 1 tablet (75 mcg total) by mouth daily before breakfast.   OXcarbazepine 150 MG tablet Commonly known as:  TRILEPTAL Take 1 tablet (150 mg total) by mouth 2 (two) times daily.   traZODone 50 MG tablet Commonly known as:  DESYREL Take 50 mg by mouth at bedtime.   venlafaxine XR 150 MG 24 hr capsule Commonly known as:  EFFEXOR-XR Take 1  capsule (150 mg total) by mouth daily with breakfast.       Disposition and follow-up:   Melanie Bell was discharged from [facility] in Good condition.  At the hospital follow up visit please address:  1.  Please monitor the patient for any signs of nausea, vomiting or abdominal pain. If she develops these signs or symptoms she will need prompt evaluation by physician.  2.  Labs / imaging needed at time of follow-up: Repeat a plain abdominal radiograph in approximately 1 week to evaluate for passage of the battery.  3.  Pending labs/ test needing follow-up: none  Follow-up Appointments: Follow-up Information    Casilda Carls. Schedule an appointment as soon as possible for a visit in 1 week(s).   Contact information: [place] [address] [phone]           Hospital Course by problem list: Active Problems:   Hypothyroidism   Tobacco use disorder   Foreign body ingestion   Swallowed foreign body   FB GI (foreign body in gastrointestinal tract)   Foreign body aspiration   1. Foreign body ingestion The patient presented to the [facility] emergency department on 04/21/2016 after swallowing 2 batteries and a tac. The patient says that she became agitated her group home which prompted her to swallow these objects. At the time of presentation  the patient was afebrile and hemodynamically stable. She denied nausea, vomiting or abdominal pain. In the emergency department an x-ray was obtained which showed 2 batteries and a tac located within the gastrointestinal tract. At the time of admission gastroenterology was consult who performed an EGD and was able to remove a single battery. She was then admitted for observation. Over the course of her inpatient stay the patient remained stable. She had one 24-hour period where she developed nausea and vomiting without abdominal pain. Her nausea and vomiting subsided after treatment with meclizine. She  had serial abdominal radiographs which marked the progress of the passage of these objects. She eventually passed the tac with radiographic confirmation. Currently, she has one battery left to pass. She'll be discharged home to pass objects. We discussed that if she develops any nausea, vomiting or abdominal pain  she will need prompt evaluation by physician. We recommend repeating a plain abdominal radiograph in approximately 1 week to evaluate for passage of this battery. On the day of discharge the patient was afebrile, hemodynamically stable and asymptomatic.  2. Schizoaffective disorder The patient has a history of schizoaffective disorder. She is on a very large and extensive psychiatric medication regimen for the treatment of this condition. During inpatient stay the patient denied suicidal or homicidal ideation. She was started on all of her home psychiatric medications. She'll be discharged on the same medications.   Discharge Vitals:   BP 135/78 (BP Location: Left Arm)   Pulse 68   Temp 98 F (36.7 C) (Oral)   Resp 16   Ht 5\' 6"  (1.676 m)   Wt 195 lb 12.8 oz (88.8 kg)   SpO2 99%   BMI 31.60 kg/m   Pertinent Labs, Studies, and Procedures:  1. Serial abdominal radiographs: tac passed, one battery within the descending colon remains. 2. EGD: One battery removed on admission.  Discharge Instructions: Discharge Instructions    Diet - low sodium heart healthy    Complete by:  As directed    Discharge instructions    Complete by:  As directed    Please continue all of your medications as prescribed.  Please continue to watch for any abdominal pain, nausea or vomiting. If you develop these signs or symptoms please immediately call your doctor or return to the emergency department as we discussed with you this morning.   Increase activity slowly    Complete by:  As directed       Signed: Ophelia Shoulder, MD 05/02/2016, 9:35 AM   Pager: [phone]

## 2016-05-02 NOTE — NC FL2 (Signed)
[place] LEVEL OF CARE SCREENING TOOL     IDENTIFICATION  Patient [name] Birthdate: 1990/05/22 Sex: female Admission Date (Current Location): 04/21/2016  [place] and [place] Number:  Kathleen Argue [id number] [place] and Address:  [facility], [place] [address] [place], [place] [address]      Provider Number: [id number]  Attending Physician Name and Address:  Lucious Groves, DO  Relative Name and Phone Number:  Legal Guardian:  Hyder [phone]     Current Level of Care: Hospital (Group Home) Recommended Level of Care: Other (Comment) (Return to [place]) Prior Approval Number:    Date Approved/Denied:   PASRR Number:    Discharge Plan: Other (Comment) (Group Home)    Current Diagnoses: Patient Active Problem List   Diagnosis Date Noted  . Foreign body aspiration   . FB GI (foreign body in gastrointestinal tract)   . Swallowed foreign body 04/23/2016  . Foreign body ingestion 04/21/2016  . Breast tumor 03/05/2016  . Schizoaffective disorder, bipolar type ([place]) 11/06/2014  . Tobacco use disorder 09/30/2014  . Hypothyroidism 09/29/2014  . Hypertension 09/29/2014    Orientation RESPIRATION BLADDER Height & Weight     Self, Time, Situation, Place  Normal Continent Weight: 195 lb 12.8 oz (88.8 kg) Height:  5\' 6"  (167.6 cm)  BEHAVIORAL SYMPTOMS/MOOD NEUROLOGICAL BOWEL NUTRITION STATUS      Continent Diet (regular diet)  AMBULATORY STATUS COMMUNICATION OF NEEDS Skin   Independent Verbally Normal                       Personal Care Assistance Level of Assistance  Bathing, Feeding, Dressing Bathing Assistance: Independent Feeding assistance: Independent (needs supervision) Dressing Assistance: Independent     Functional Limitations Info  Sight, Hearing, Speech Sight Info: Adequate Hearing Info: Adequate Speech Info: Adequate    SPECIAL CARE FACTORS FREQUENCY                        Contractures Contractures Info: Not present    Additional Factors Info  Code Status, Allergies Code Status Info: Full Code Allergies Info: Betadine Povidone Iodine, Iodine, Shellfish-derived Products           Current Medications (05/02/2016):  This is the current hospital active medication list Current Facility-Administered Medications  Medication Dose Route Frequency Provider Last Rate Last Dose  . acetaminophen (TYLENOL) tablet 1,000 mg  1,000 mg Oral Q6H PRN Minus [place], MD   1,000 mg at 04/23/16 2236  . enoxaparin (LOVENOX) injection 40 mg  40 mg Subcutaneous Q24H Riccardo Dubin, MD   40 mg at 05/02/16 0925  . fluPHENAZine (PROLIXIN) tablet 5 mg  5 mg Oral BID Lucious Groves, DO   5 mg at 05/02/16 [id number]   And  . fluPHENAZine (PROLIXIN) tablet 10 mg  10 mg Oral QHS Lucious Groves, DO   10 mg at 05/01/16 2235  . levothyroxine (SYNTHROID, LEVOTHROID) tablet 75 mcg  75 mcg Oral QAC breakfast Riccardo Dubin, MD   75 mcg at 05/02/16 [id number]  . lithium carbonate (LITHOBID) CR tablet 300 mg  300 mg Oral Q12H Ophelia Shoulder, MD   300 mg at 05/02/16 0924  . lurasidone (LATUDA) tablet 120 mg  120 mg Oral QPM Ophelia Shoulder, MD   120 mg at 05/01/16 1751  . meclizine (ANTIVERT) tablet 25 mg  25 mg Oral TID PRN Ophelia Shoulder, MD   25  mg at 04/25/16 1700  . nicotine (NICODERM CQ - dosed in mg/24 hours) patch 14 mg  14 mg Transdermal Daily Riccardo Dubin, MD   14 mg at 05/02/16 0923  . OXcarbazepine (TRILEPTAL) tablet 150 mg  150 mg Oral BID Ophelia Shoulder, MD   150 mg at 05/02/16 [id number]  . traZODone (DESYREL) tablet 50 mg  50 mg Oral QHS Riccardo Dubin, MD   50 mg at 05/01/16 2234  . venlafaxine XR (EFFEXOR-XR) 24 hr capsule 150 mg  150 mg Oral Q breakfast Ophelia Shoulder, MD   150 mg at 05/02/16 [id number]     Discharge Medications: Medication List    STOP taking these medications   polyethylene glycol packet Commonly known as:  MIRALAX / GLYCOLAX    TAKE these medications   amantadine  100 MG capsule Commonly known as:  SYMMETREL Take 1 capsule (100 mg total) by mouth 2 (two) times daily.  amLODipine 2.5 MG tablet Commonly known as:  NORVASC Take 1 tablet (2.5 mg total) by mouth daily.  docusate sodium 100 MG capsule Commonly known as:  COLACE Take 2 capsules (200 mg total) by mouth 2 (two) times daily.  fluPHENAZine 5 MG tablet Commonly known as:  PROLIXIN Take 5-10 mg by mouth See admin instructions. Take 5 mg by mouth in the morning, take 5 mg by mouth in the afternoon and take 10 mg by mouth at bedtime.  LATUDA 120 MG Tabs Generic drug:  Lurasidone HCl Take 120 mg by mouth every evening.  levothyroxine 75 MCG tablet Commonly known as:  SYNTHROID, LEVOTHROID Take 1 tablet (75 mcg total) by mouth daily before breakfast.  OXcarbazepine 150 MG tablet Commonly known as:  TRILEPTAL Take 1 tablet (150 mg total) by mouth 2 (two) times daily.  traZODone 50 MG tablet Commonly known as:  DESYREL Take 50 mg by mouth at bedtime.  venlafaxine XR 150 MG 24 hr capsule Commonly known as:  EFFEXOR-XR Take 1 capsule (150 mg total) by mouth daily with breakfast.     Relevant Imaging Results:  Relevant Lab Results:   Additional Information    [place], LCSW

## 2016-05-02 NOTE — Progress Notes (Signed)
   Subjective:  Unchanged. No complaints. Denies n/v or abdominal pain. Will be d/c to return to group home this morning.   Objective:  Vital signs in last 24 hours: Vitals:   05/01/16 0512 05/01/16 1422 05/01/16 2012 05/02/16 0423  BP: 108/65 131/80 132/68 135/78  Pulse: 80 73 75 68  Resp: 17 17 16 16   Temp: 98 F (36.7 C) 98.2 F (36.8 C) 98.7 F (37.1 C) 98 F (36.7 C)  TempSrc: Oral Oral Oral Oral  SpO2: 98% 100% 99% 99%  Weight:      Height:       Physical Exam  Constitutional: She is oriented to person, place, and time. She appears well-developed and well-nourished.  HENT:  Head: Normocephalic and atraumatic.  Cardiovascular: Normal rate and regular rhythm.  Exam reveals no gallop and no friction rub.   No murmur heard. Respiratory: Effort normal and breath sounds normal. No respiratory distress. She has no wheezes.  GI: Soft. Bowel sounds are normal. She exhibits no distension. There is no rebound and no guarding.  No pain to palpation  Musculoskeletal: She exhibits no edema.  Neurological: She is alert and oriented to person, place, and time.  Skin: Skin is warm and dry.   Assessment/Plan:  Active Problems:   Hypothyroidism   Tobacco use disorder   Foreign body ingestion   Swallowed foreign body   FB GI (foreign body in gastrointestinal tract)   Foreign body aspiration  In summary, pt doing well w/o n/v or abdominal pain. Repeat imaging shows that the tac has passed. The battery remains within the descending colon. The battery is of less concern and she will be able to discharge and pass this at home with repeat imaging in one week to ensure it has passed. There are no additional concerns or reasons for hospitalization at this time. We counseled the patient on signs and symptoms which should prompt her to receive care or have repeat imaging sooner. Including n/v and abdominal pain. She verbalized understanding.  1. Foreign body ingestion 1 battery removed by  GI. Continues to have single battery within bowel. Tac passed on its own with conformation on imaging. No n/v pain.   -- D/C today with return to group home -- repeat abdominal image in 1 week to assess if remaining battery has passed   2. Schizoaffective disorder -- Appreciate psych recs:  - d/c IVC at discharge -- On all home psych meds  3. Hypertension Stable BPs. -- Hold home amlodipine  4.Hypothyroidism  -- synthroid 75 mg daily.   5. Tobacco Use -- Nicotine patch  DVT/PE prophylaxis: Lovenox FEN/GI: Normal diet  Dispo: Anticipated discharge this morning.   Ophelia Shoulder, MD 05/02/2016, 8:07 AM Pager: [phone]

## 2016-05-02 NOTE — Progress Notes (Signed)
LCSW has received confirmation that patient is medically stable for discharge back to her [place] [facility] today. LCSW has made contact to Ms. [place] for discharge and she is agreeable to accept patient back. She reports she cannot pick patient up due to inability to [name] as it is not working.  LCSW is working actively on arranging transport. Patient will be able to leave after 12:00 today as Director wants to be in building when she arrives.  Patient's documents were updated and faxed to facility for review.  No other needs at this time. Patient's mother (legal guardian) has been contacted, message left and facility has also been in touch with pt mom in effort to update her since she is difficult to reach.  Will sign off at this time. No other needs.  Lane Hacker, MSW Clinical Social Work: [employer] Cover[employer] for :  [phone]

## 2016-05-05 ENCOUNTER — Emergency Department: Payer: Medicaid Other

## 2016-05-05 ENCOUNTER — Other Ambulatory Visit: Payer: Self-pay

## 2016-05-05 ENCOUNTER — Encounter: Payer: Self-pay | Admitting: Occupational Medicine

## 2016-05-05 ENCOUNTER — Emergency Department
Admission: EM | Admit: 2016-05-05 | Discharge: 2016-05-07 | Disposition: A | Discharge status: Home / Self Care | Payer: Medicaid Other | Attending: Emergency Medicine | Admitting: Emergency Medicine

## 2016-05-05 DIAGNOSIS — G2401 Drug induced subacute dyskinesia: Secondary | ICD-10-CM | POA: Insufficient documentation

## 2016-05-05 DIAGNOSIS — I10 Essential (primary) hypertension: Secondary | ICD-10-CM | POA: Insufficient documentation

## 2016-05-05 DIAGNOSIS — T189XXS Foreign body of alimentary tract, part unspecified, sequela: Secondary | ICD-10-CM

## 2016-05-05 DIAGNOSIS — F17210 Nicotine dependence, cigarettes, uncomplicated: Secondary | ICD-10-CM | POA: Insufficient documentation

## 2016-05-05 DIAGNOSIS — Z79899 Other long term (current) drug therapy: Secondary | ICD-10-CM | POA: Diagnosis not present

## 2016-05-05 DIAGNOSIS — T182XXA Foreign body in stomach, initial encounter: Secondary | ICD-10-CM | POA: Diagnosis not present

## 2016-05-05 DIAGNOSIS — R0602 Shortness of breath: Secondary | ICD-10-CM | POA: Diagnosis present

## 2016-05-05 DIAGNOSIS — E039 Hypothyroidism, unspecified: Secondary | ICD-10-CM | POA: Insufficient documentation

## 2016-05-05 DIAGNOSIS — R109 Unspecified abdominal pain: Secondary | ICD-10-CM | POA: Diagnosis not present

## 2016-05-05 DIAGNOSIS — F250 Schizoaffective disorder, bipolar type: Secondary | ICD-10-CM | POA: Diagnosis present

## 2016-05-05 DIAGNOSIS — R45851 Suicidal ideations: Secondary | ICD-10-CM | POA: Insufficient documentation

## 2016-05-05 DIAGNOSIS — T189XXA Foreign body of alimentary tract, part unspecified, initial encounter: Secondary | ICD-10-CM | POA: Diagnosis present

## 2016-05-05 DIAGNOSIS — Z765 Malingerer [conscious simulation]: Secondary | ICD-10-CM | POA: Diagnosis not present

## 2016-05-05 DIAGNOSIS — X788XXA Intentional self-harm by other sharp object, initial encounter: Secondary | ICD-10-CM | POA: Diagnosis not present

## 2016-05-05 LAB — URINE DRUG SCREEN, QUALITATIVE (ARMC ONLY)
Amphetamines, Ur Screen: NOT DETECTED
BARBITURATES, UR SCREEN: NOT DETECTED
BENZODIAZEPINE, UR SCRN: NOT DETECTED
CANNABINOID 50 NG, UR ~~LOC~~: NOT DETECTED
Cocaine Metabolite,Ur ~~LOC~~: NOT DETECTED
MDMA (Ecstasy)Ur Screen: NOT DETECTED
Methadone Scn, Ur: NOT DETECTED
Opiate, Ur Screen: NOT DETECTED
PHENCYCLIDINE (PCP) UR S: NOT DETECTED
TRICYCLIC, UR SCREEN: POSITIVE — AB

## 2016-05-05 IMAGING — CR DG ABDOMEN 1V
1 series · 1 of 1 positions shown · non-contrast
Comparison: [DATE]

CLINICAL DATA: Swallowed a razor

EXAM:
ABDOMEN - 1 VIEW

[dg abd 1 view]
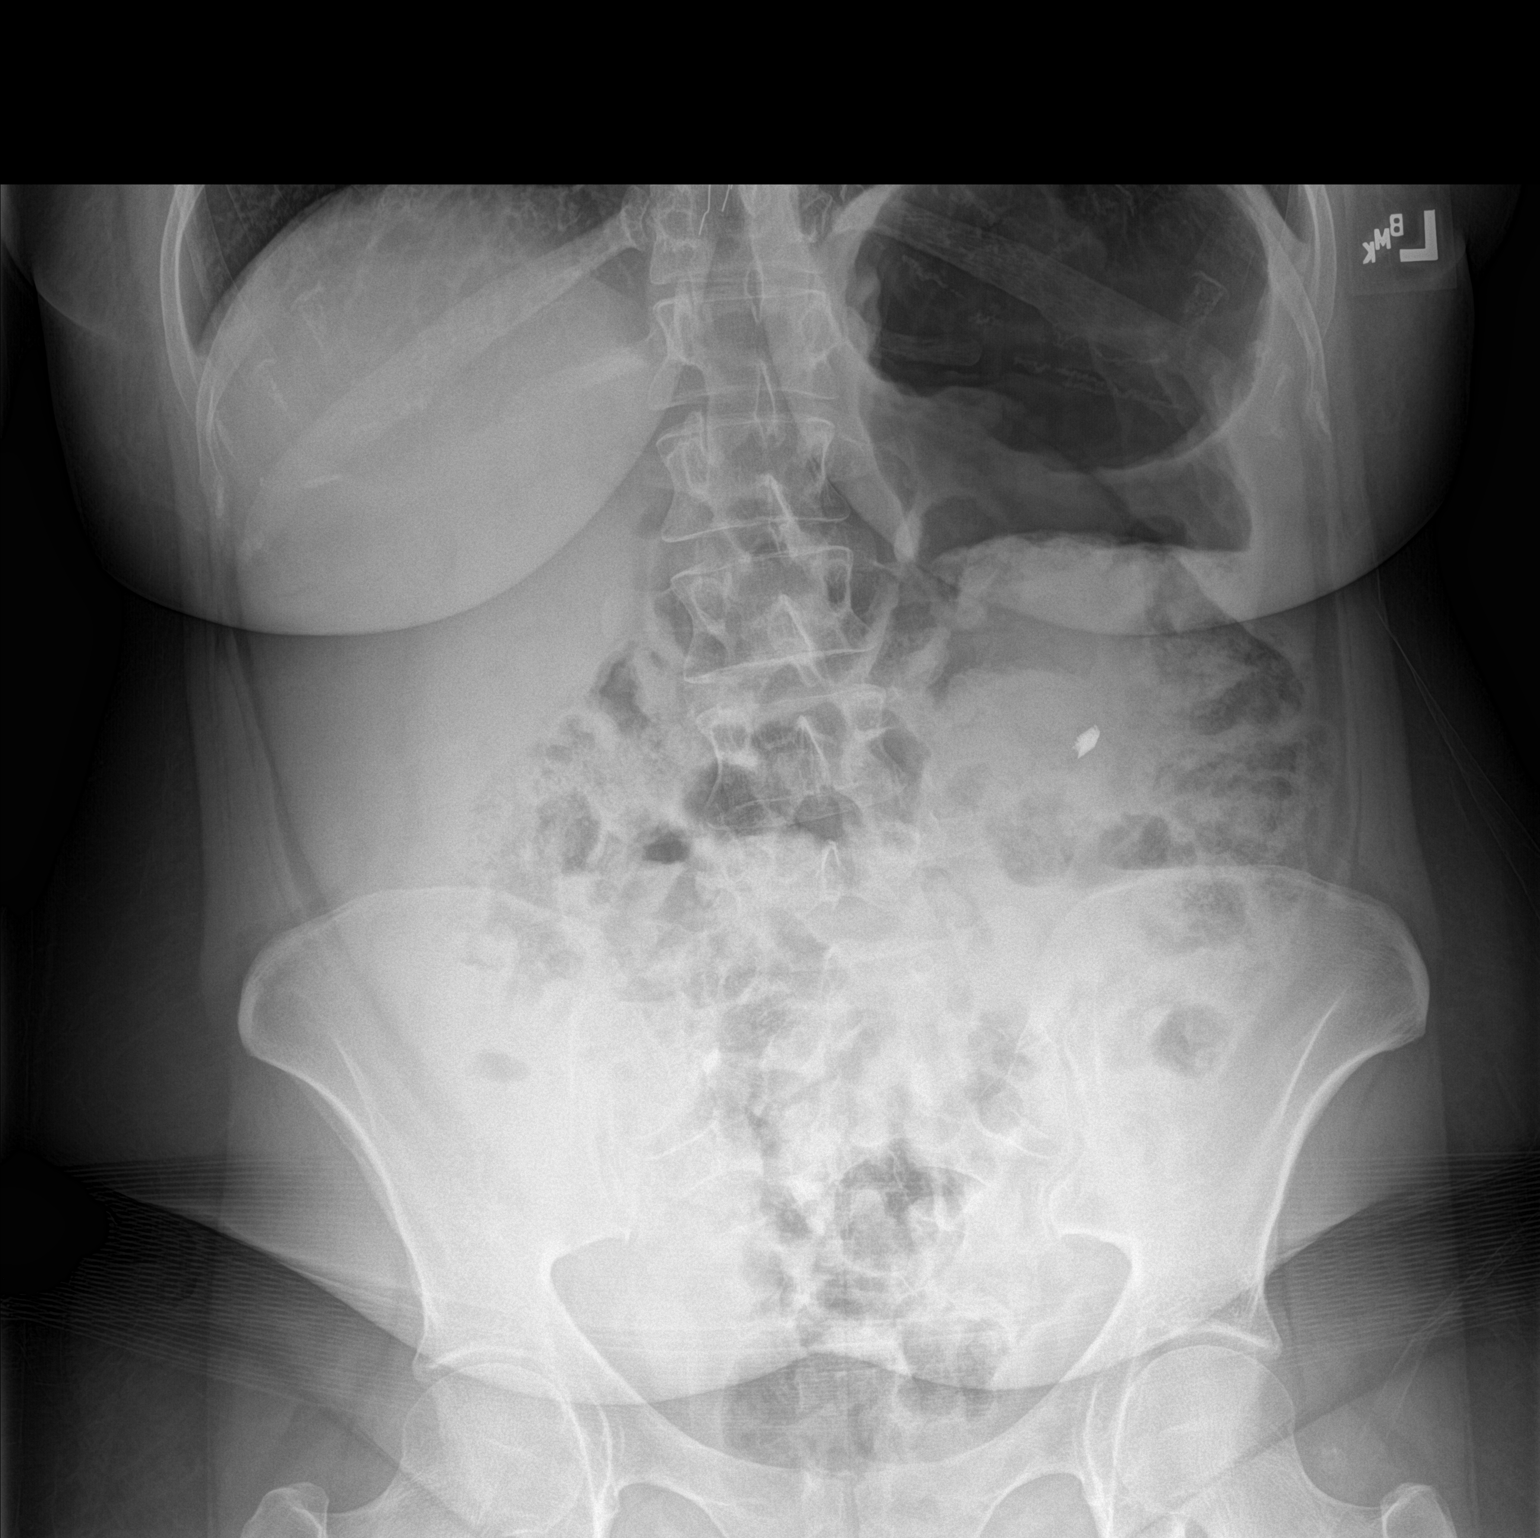

[1 of 1 positions shown; findings below may reference images not displayed]

FINDINGS: The battery is no longer visible.

There is a new radiopaque object in the left abdomen. This may be
within the gastric lumen. Nature of the object is indeterminate.

No bowel obstruction or perforation.

Unchanged linear radiopaque objects projecting over the midline
lower chest are in the subcutaneous tissues anteriorly, as seen on
[DATE] and [DATE].
IMPRESSION: New radiopaque foreign object projecting over the left abdomen,
probably in the gastric lumen. Recently observed battery is no
longer present.

## 2016-05-05 IMAGING — CR DG CHEST 1V
1 series · 1 of 1 positions shown · non-contrast
Comparison: [DATE]

CLINICAL DATA: Upper chest pain, tightness and dyspnea for 1 hour.

EXAM:
CHEST 1 VIEW

[dg chest 1 view]
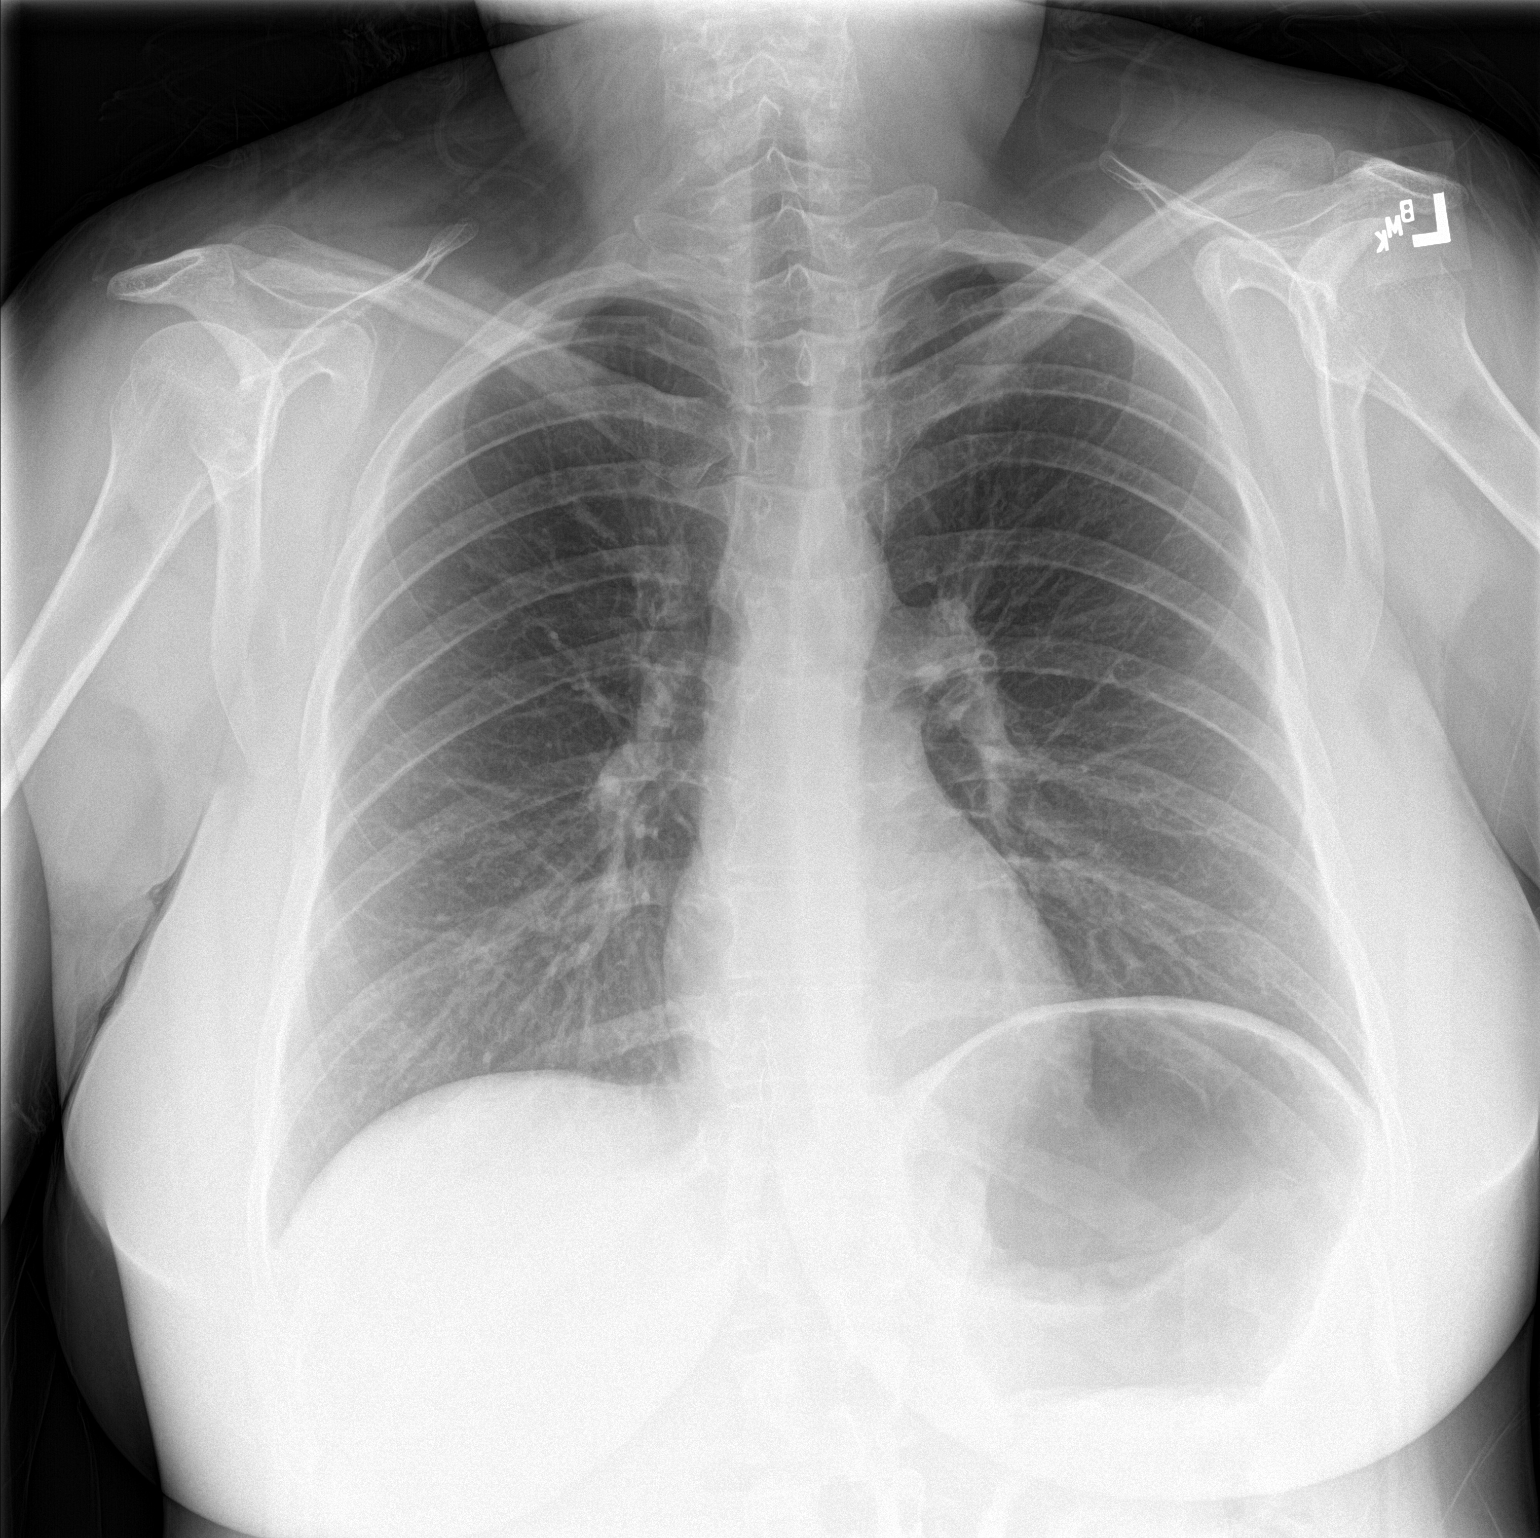

[1 of 1 positions shown; findings below may reference images not displayed]

FINDINGS: The heart size and mediastinal contours are within normal limits.
Both lungs are clear. The visualized skeletal structures are
unremarkable.
IMPRESSION: No active disease.

## 2016-05-05 NOTE — ED Notes (Signed)
Pt denies SI and HI at this time. Safety contract obtained at this time. Camera operator and PA made aware.

## 2016-05-05 NOTE — ED Notes (Signed)
2 unsuccessful PIV attempts by this RN (left forearm and left AC).

## 2016-05-05 NOTE — ED Notes (Addendum)
This Rn attempted blood draw x 2, blood was slow and would hemolyze. Would not flow into blood tube from butterfly tubing.   Alissa, EDT wheeled pt from room 45 to room 25.

## 2016-05-05 NOTE — ED Notes (Signed)
This nurse stayed with patient since arrival til [place] came to place IV. Alissa Ed tech to sit with patient for safety at this time.
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
## 2016-05-05 NOTE — ED Provider Notes (Signed)
[facility] Emergency Department Provider Note   ____________________________________________   First MD Initiated Contact with Patient 05/05/16 2244     (approximate)  I have reviewed the triage vital signs and the nursing notes.   HISTORY  Chief Complaint Chest Pain and Shortness of Breath   HPI Melanie Bell is a 25 y.o. female is here via EMS after being picked up at a gasoline station with complaint of shortness of breath and "asthma attack". Patient states she walked away from her group home and began walking when she became short of breath and had difficulty breathing. She does not have an inhaler and has not had 1 for over a year. She states that the shortness of breath began while she was walking which is approximately 30 minutes to an hour ago. She complains of  anterior bilateral upper chest pain. She denies any previous cardiac history other than a reported heart murmur. She denies any difficulty swallowing and currently is not having any difficulty speaking with myself or the nurse. She rates her pain as a 9/10. She then relates to the nurse that earlier she swallowed a razor blade and attempted to kill herself. After looking at her last visit to the emergency room she was seen on 04/21/16 for swallowed foreign body of 2 batteries and AP and in a suicide attempt she was transferred from [place] to [facility]  ER for GI surgery.   Past Medical History:  Diagnosis Date  . Anxiety   . Asthma   . Depression   . GERD (gastroesophageal reflux disease)   . Hallucinations 09/30/2014   Sizoaffective  . Hyperlipidemia   . Hypertension   . Intentional ingestion of batteries 04/21/2016    2 AAA batteries and 1 thumb tack; intent to hurt herself/notes 04/21/2016  . Tardive dyskinesia 10/2014   recent onset    Patient Active Problem List   Diagnosis Date Noted  . Foreign body aspiration   . FB GI (foreign body in gastrointestinal tract)   . Swallowed  foreign body 04/23/2016  . Foreign body ingestion 04/21/2016  . Gastric foreign body   . Breast tumor 03/05/2016  . Schizoaffective disorder, bipolar type ([place]) 11/06/2014  . Tobacco use disorder 09/30/2014  . Hypothyroidism 09/29/2014  . Hypertension 09/29/2014    Past Surgical History:  Procedure Laterality Date  . ABDOMINAL SURGERY     "years ago" to remove foreign objects  . APPENDECTOMY    . BREAST LUMPECTOMY Right   . COLONOSCOPY WITH PROPOFOL N/A 09/10/2015   Procedure: COLONOSCOPY WITH PROPOFOL;  Surgeon: [name], MD;  Location: [facility] ENDOSCOPY;  Service: Endoscopy;  Laterality: N/A;  . ESOPHAGOGASTRODUODENOSCOPY N/A 11/28/2014   Procedure: ESOPHAGOGASTRODUODENOSCOPY (EGD);  Surgeon: [name], MD;  Location: [facility] ENDOSCOPY;  Service: Endoscopy;  Laterality: N/A;  . ESOPHAGOGASTRODUODENOSCOPY N/A 02/21/2016   Procedure: ESOPHAGOGASTRODUODENOSCOPY (EGD);  Surgeon: Mauri Pole, MD;  Location: [facility] ENDOSCOPY;  Service: Endoscopy;  Laterality: N/A;  . ESOPHAGOGASTRODUODENOSCOPY N/A 04/21/2016   Procedure: ESOPHAGOGASTRODUODENOSCOPY (EGD);  Surgeon: Gatha Mayer, MD;  Location[facility] ENDOSCOPY;  Service: Endoscopy;  Laterality: N/A;  . ESOPHAGOGASTRODUODENOSCOPY (EGD) WITH PROPOFOL N/A 02/29/2016   Procedure: ESOPHAGOGASTRODUODENOSCOPY (EGD) WITH PROPOFOL;  Surgeon: [name], MD;  Location: [facility] ENDOSCOPY;  Service: Endoscopy;  Laterality: N/A;  . LAPAROTOMY N/A 09/12/2015   Procedure: EXPLORATORY LAPAROTOMY;  Surgeon: Florene Glen, MD;  Location: [facility] ORS;  Service: General;  Laterality: N/A;  . SIGMOIDOSCOPY N/A 09/12/2015   Procedure: SIGMOIDOSCOPY;  Surgeon: Florene Glen, MD;  Location: [facility] ORS;  Service: General;  Laterality: N/A;  . WISDOM TOOTH EXTRACTION      Prior to Admission medications   Medication Sig Start Date End Date Taking? Authorizing Provider  amantadine (SYMMETREL) 100 MG capsule Take 1 capsule (100 mg total) by mouth 2 (two) times  daily. 03/07/16  Yes [name], MD  amLODipine (NORVASC) 2.5 MG tablet Take 1 tablet (2.5 mg total) by mouth daily. 06/19/15  Yes [name] [name], MD  fluPHENAZine (PROLIXIN) 5 MG tablet Take 5-10 mg by mouth See admin instructions. Take 5 mg by mouth in the morning, take 5 mg by mouth in the afternoon and take 10 mg by mouth at bedtime.   Yes Historical Provider, MD  levothyroxine (SYNTHROID, LEVOTHROID) 75 MCG tablet Take 1 tablet (75 mcg total) by mouth daily before breakfast. 06/19/15  Yes [name] [name], MD  Lurasidone HCl (LATUDA) 120 MG TABS Take 120 mg by mouth every evening.   Yes Historical Provider, MD  OXcarbazepine (TRILEPTAL) 150 MG tablet Take 1 tablet (150 mg total) by mouth 2 (two) times daily. 06/19/15  Yes [name] [name], MD  traZODone (DESYREL) 50 MG tablet Take 50 mg by mouth at bedtime.   Yes Historical Provider, MD  venlafaxine XR (EFFEXOR-XR) 150 MG 24 hr capsule Take 1 capsule (150 mg total) by mouth daily with breakfast. 06/19/15  Yes [name] [name], MD    Allergies Betadine [povidone iodine]; Iodine; and Shellfish-derived products  Family History  Problem Relation Age of Onset  . Depression Mother   . Hypertension Mother   . Sleep apnea Mother   . Asthma Mother   . COPD Mother   . Diabetes Mother     Social History Social History  Substance Use Topics  . Smoking status: Current Every Day Smoker    Packs/day: 0.50    Years: 3.00    Types: Cigarettes  . Smokeless tobacco: Never Used  . Alcohol use No    Review of Systems Constitutional: No fever/chills Eyes: No visual changes. ENT: No sore throat. Cardiovascular: Positive chest pain.Marland Kitchen Respiratory: Positive shortness of breath Gastrointestinal: No abdominal pain.  No nausea, no vomiting.  Skin: Negative for rash. Abdomen with healing surgical site. Neurological: Negative for headaches, focal weakness or numbness. Psychiatric:Positive history of depression. Positive  suicidal ideation. 10-point ROS otherwise negative.  ____________________________________________   PHYSICAL EXAM:  VITAL SIGNS: ED Triage Vitals  Enc Vitals Group     BP 05/05/16 2243 121/72     Pulse Rate 05/05/16 2243 97     Resp 05/05/16 2243 20     Temp 05/05/16 2243 98.1 F (36.7 C)     Temp Source 05/05/16 2243 Oral     SpO2 05/05/16 2238 99 %     Weight 05/05/16 2243 195 lb (88.5 kg)     Height 05/05/16 2243 5\' 6"  (1.676 m)     Head Circumference --      Peak Flow --      Pain Score 05/05/16 2243 9     Pain Loc --      Pain Edu? --      Excl. in [place]? --     Constitutional: Alert and oriented. Well appearing and in no acute distress.Affect is flat and patient is soft spoken. Eyes: Conjunctivae are normal. PERRL. EOMI. Head: Atraumatic. Nose: No congestion/rhinnorhea. Mouth/Throat: Mucous membranes are moist.  Oropharynx non-erythematous. No edema and no evidence of trauma Neck: No stridor.  Cardiovascular: Normal rate, regular rhythm. Grossly normal heart sounds.  Good peripheral circulation. Respiratory: Normal respiratory effort.  No retractions. Lungs CTAB.  No wheezes or decreased air exchange noted on exam. Gastrointestinal: Soft and nontender. No distention. Bowel sounds normoactive 4 quadrants. Musculoskeletal: Moves upper and lower extremities without any difficulty. Neurologic:  Normal speech and language. No gross focal neurologic deficits are appreciated. No gait instability. Skin:  Skin is warm, dry and intact.  Psychiatric: Mood and affect are normal. Speech and behavior are normal.  ____________________________________________   LABS (all labs ordered are listed, but only abnormal results are displayed)  Labs Reviewed  CBC WITH DIFFERENTIAL/PLATELET - Abnormal; Notable for the following:       Result Value   WBC 12.6 (*)    Neutro Abs 8.9 (*)    All other components within normal limits  URINE DRUG SCREEN, QUALITATIVE ([facility] ONLY) - Abnormal;  Notable for the following:    Tricyclic, Ur Screen POSITIVE (*)    All other components within normal limits  ACETAMINOPHEN LEVEL - Abnormal; Notable for the following:    Acetaminophen (Tylenol), Serum <10 (*)    All other components within normal limits  TROPONIN I  SALICYLATE LEVEL  POC URINE PREG, ED   ____________________________________________  EKG  ED ECG REPORT I, Kersey N Isadore Bokhari, the attending physician, personally viewed and interpreted this ECG.   Date: 05/06/2016  EKG Time: 10:58 PM  Rate: 94  Rhythm: Normal sinus rhythm  Axis: Normal  Intervals: Normal  ST&T Change: None  ____________________________________________  RADIOLOGY  CLINICAL DATA:  25 year old female with abdominal pain status post swelling a razor blade.  EXAM: CT CHEST, ABDOMEN AND PELVIS WITHOUT CONTRAST  TECHNIQUE: Multidetector CT imaging of the chest, abdomen and pelvis was performed following the standard protocol without IV contrast.  COMPARISON:  Abdominal radiograph dated 05/05/2016  FINDINGS: Evaluation of this exam is limited in the absence of intravenous contrast.  CT CHEST FINDINGS  Cardiovascular: There is no cardiomegaly or pericardial effusion. The thoracic aorta and central pulmonary arteries are grossly unremarkable.  Mediastinum/Nodes: No hilar or mediastinal adenopathy. No fluid collection. The esophagus appears grossly unremarkable. No esophageal wall thickening or evidence of perforation. The thyroid gland is grossly unremarkable.  Lungs/Pleura: The lungs are clear. There is no pleural effusion or pneumothorax. The central airways are patent.  Musculoskeletal: No chest wall mass or suspicious bone lesions identified.  CT ABDOMEN PELVIS FINDINGS  No intra-abdominal free air or free fluid  Hepatobiliary: No focal liver abnormality is seen. No gallstones, gallbladder wall thickening, or biliary dilatation.  Pancreas: Unremarkable. No  pancreatic ductal dilatation or surrounding inflammatory changes.  Spleen: Normal in size without focal abnormality.  Adrenals/Urinary Tract: Adrenal glands are unremarkable. Kidneys are normal, without renal calculi, focal lesion, or hydronephrosis. Bladder is unremarkable.  Stomach/Bowel: There is a linear metallic density measuring 3.7 cm in length in the proximal stomach likely corresponding to the swallowed razor blade. Small high attenuating content mixed with gastric content adjacent to the razor blade is concerning for small amount of hemorrhage. There is no evidence of stop gastric wall discontinuity or perforation. Moderate stool noted throughout the colon. No evidence of bowel obstruction or active inflammation. The cecum is positioned in the lower abdomen in the midline, likely a mobile cecum. Appendectomy.  Vascular/Lymphatic: The abdominal aorta and IVC are grossly unremarkable on this noncontrast study. No portal venous gas identified. There is no adenopathy.  Reproductive: The uterus and ovaries are grossly unremarkable.  Other: Midline vertical anterior abdominal wall incisional scar with a small fat containing umbilical hernia. No fluid collection or active inflammation.  Musculoskeletal: Small subcutaneous metallic wires in the anterior chest wall. No associated inflammatory changes or fluid collection. These appear to be chronic and present on radiographs dating back to October 2017. Bilateral L4 pars defects. No listhesis. No acute osseous pathology.  IMPRESSION: Metallic density in the proximal stomach corresponding to the ingested razor blade. There appears to be small amount of hemorrhage surrounding the razor blade within the stomach without definite evidence of perforation. The esophagus is intact  as well.  Small metallic wires in the subcutaneous soft tissues of the anterior chest without associated inflammatory changes or  fluid collection.  These results were called by telephone at the time of interpretation on 05/06/2016 at 12:35 am to Dr. Marjean Donna , who verbally acknowledged these results.  CONTRAST:  100 cc Isovue 370   Electronically Signed   By: Anner Crete M.D.   On: 05/06/2016 00:42 ____________________________________________   Procedures  CRITICAL CARE: CRITICAL CARE Performed by: Gregor Hams   Total critical care time: 30 minutes  Critical care time was exclusive of separately billable procedures and treating other patients.  Critical care was necessary to treat or prevent imminent or life-threatening deterioration.  Critical care was time spent personally by me on the following activities: development of treatment plan with patient and/or surrogate as well as nursing, discussions with consultants, evaluation of patient's response to treatment, examination of patient, obtaining history from patient or surrogate, ordering and performing treatments and interventions, ordering and review of laboratory studies, ordering and review of radiographic studies, pulse oximetry and re-evaluation of patient's condition.    INITIAL IMPRESSION / ASSESSMENT AND PLAN / ED COURSE  Pertinent labs & imaging results that were available during my care of the patient were reviewed by me and considered in my medical decision making (see chart for details).    Clinical Course    Prior to results of x-rays and lab work patient was transferred to major room 25 for closer observation due to her suicidal ideation and referenced swallowed foreign body in an attempt to commit suicide. Dr. Owens Shark assumed care of this patient. Lab work was still in process. Patient was taken to major from [facility].   I assumed care to the patient 11:00 PM. Patient admits to swallowing the tip of a razor blade in an attempt to "kill myself". Patient admits to central chest discomfort and epigastric abdominal  pain.   Spoke with Dr. Vicente Males regarding x-ray finding which showed a metallic fragment consistent with possible razor blade in the stomach. CT chest abdomen pelvis performed which revealed no evidence of perforation razor blade noted in proximal portion the stomach. Dr. Vicente Males in route to the emergency department to evaluate the patient with plan to take to the operating room for extraction.  Dr. Vicente Males took the patient to the endoscopy suite and removed a razor blade. Patient's involuntary committed awaiting psych consultation.  ________________________________________   FINAL CLINICAL IMPRESSION(S) / ED DIAGNOSES  Final diagnoses:  Suicidal ideation  Foreign body in stomach, initial encounter      NEW MEDICATIONS STARTED DURING THIS VISIT:  Current Discharge Medication List       Note:  This document was prepared using Dragon voice recognition software and may include unintentional dictation errors.    Johnn Hai, PA-C 05/06/16 0026    Gregor Hams, MD 05/06/16 0600

## 2016-05-05 NOTE — ED Notes (Signed)
Anda Kraft RN Attempted IV and Butterfly stick both unsuccessful. Pt to xray at this time.

## 2016-05-06 ENCOUNTER — Encounter: Payer: Self-pay | Admitting: Anesthesiology

## 2016-05-06 ENCOUNTER — Emergency Department: Payer: Medicaid Other | Admitting: Certified Registered Nurse Anesthetist

## 2016-05-06 ENCOUNTER — Emergency Department: Payer: Medicaid Other

## 2016-05-06 ENCOUNTER — Encounter
Admission: EM | Disposition: A | Discharge status: Home / Self Care | Payer: Self-pay | Source: Home / Self Care | Attending: Emergency Medicine

## 2016-05-06 DIAGNOSIS — Z765 Malingerer [conscious simulation]: Secondary | ICD-10-CM

## 2016-05-06 DIAGNOSIS — T182XXA Foreign body in stomach, initial encounter: Secondary | ICD-10-CM

## 2016-05-06 HISTORY — PX: ESOPHAGOGASTRODUODENOSCOPY: SHX5428

## 2016-05-06 LAB — CBC WITH DIFFERENTIAL/PLATELET
BASOS ABS: 0.1 10*3/uL (ref 0–0.1)
BASOS PCT: 1 %
Eosinophils Absolute: 0.4 10*3/uL (ref 0–0.7)
Eosinophils Relative: 3 %
HEMATOCRIT: 36.9 % (ref 35.0–47.0)
Hemoglobin: 12.6 g/dL (ref 12.0–16.0)
Lymphocytes Relative: 17 %
Lymphs Abs: 2.2 10*3/uL (ref 1.0–3.6)
MCH: 31.3 pg (ref 26.0–34.0)
MCHC: 34 g/dL (ref 32.0–36.0)
MCV: 92 fL (ref 80.0–100.0)
MONO ABS: 0.9 10*3/uL (ref 0.2–0.9)
Monocytes Relative: 8 %
NEUTROS ABS: 8.9 10*3/uL — AB (ref 1.4–6.5)
NEUTROS PCT: 71 %
Platelets: 218 10*3/uL (ref 150–440)
RBC: 4.01 MIL/uL (ref 3.80–5.20)
RDW: 13.9 % (ref 11.5–14.5)
WBC: 12.6 10*3/uL — AB (ref 3.6–11.0)

## 2016-05-06 LAB — ACETAMINOPHEN LEVEL

## 2016-05-06 LAB — TROPONIN I: Troponin I: <0.03 ng/mL (ref <0.03)

## 2016-05-06 LAB — SALICYLATE LEVEL

## 2016-05-06 IMAGING — CT CT ABD-PELV W/O CM
2 of 4 series · 13 of 36 positions shown, 16 images · non-contrast
Comparison: Abdominal radiograph dated [DATE]

CLINICAL DATA: 25-year-old female with abdominal pain status post
swelling a razor blade.

EXAM:
CT CHEST, ABDOMEN AND PELVIS WITHOUT CONTRAST
TECHNIQUE: Multidetector CT imaging of the chest, abdomen and pelvis was
performed following the standard protocol without IV contrast.

[Series 2: axial st · axial · 0.98mm/px · z∈[-828,-288]mm · 10 of 128 slices shown, 13 images]
[im 10/128  mediastinal]
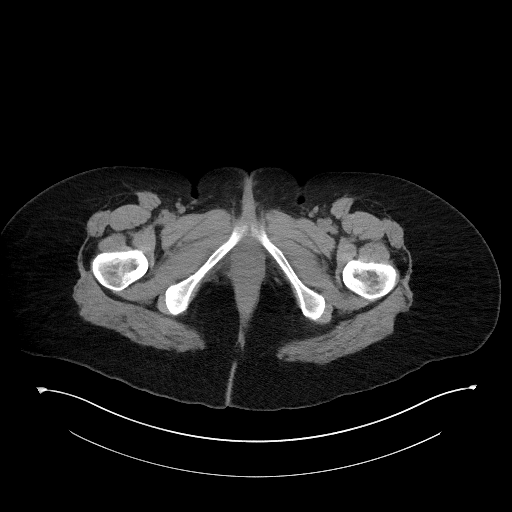
[im 10/128  lung]
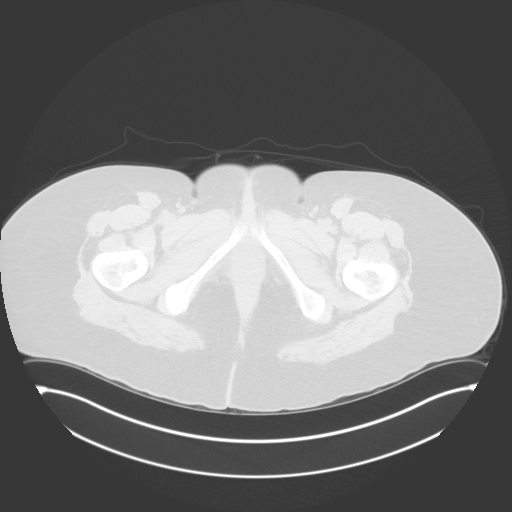
[im 20/128  lung]
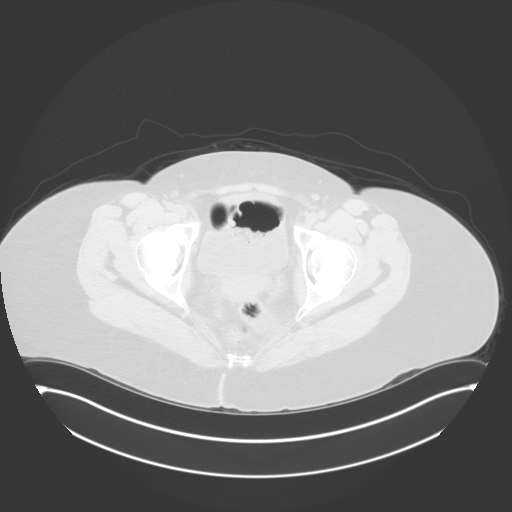
[im 40/128  lung]
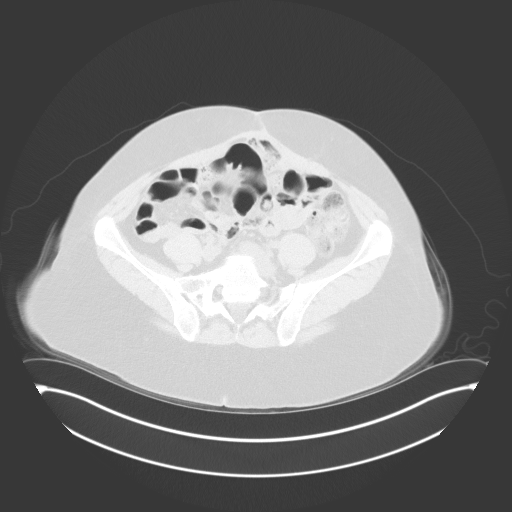
[im 49/128  lung]
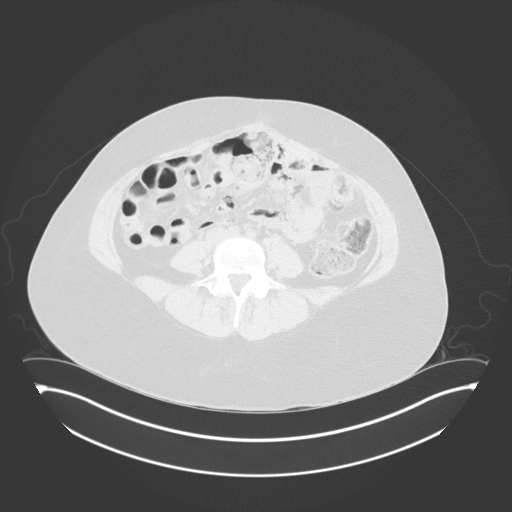
[im 59/128  mediastinal]
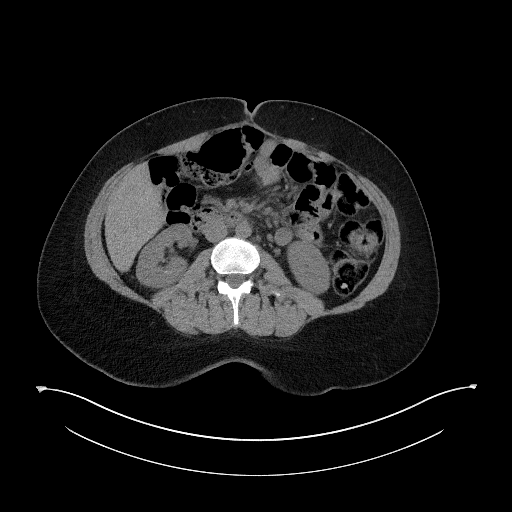
[im 59/128  lung]
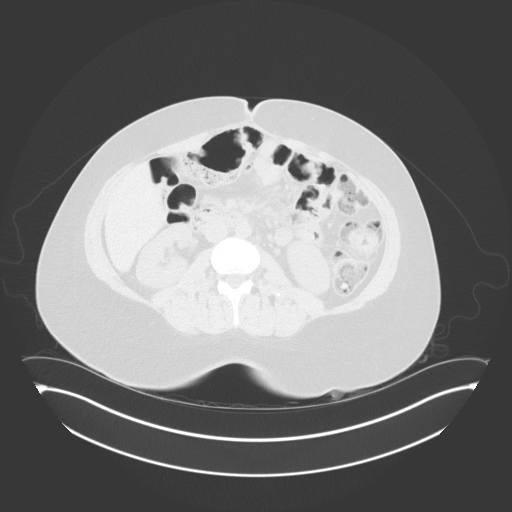
[im 69/128  lung]
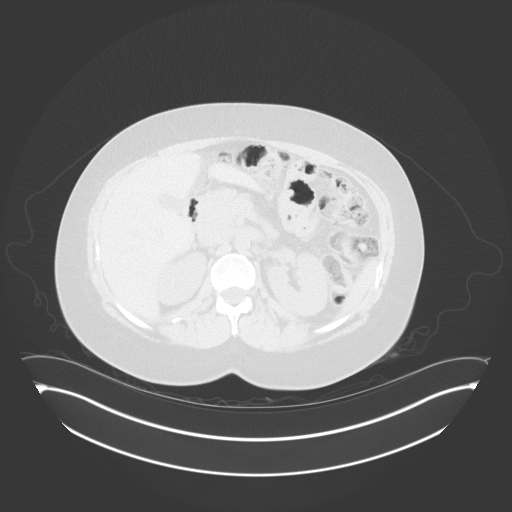
[im 79/128  lung]
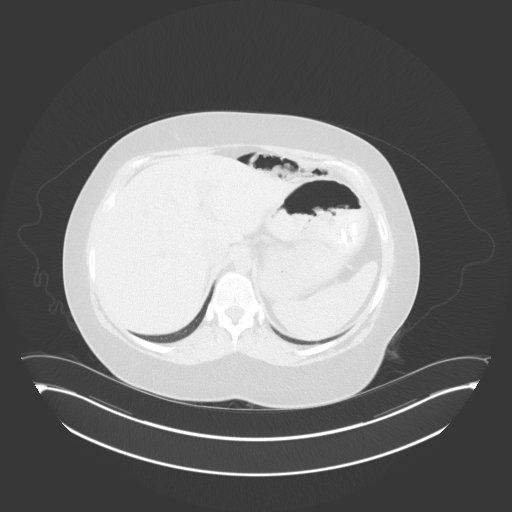
[im 98/128  lung]
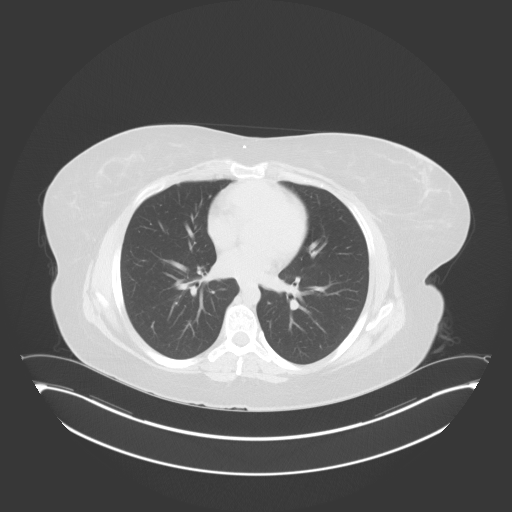
[im 108/128  mediastinal]
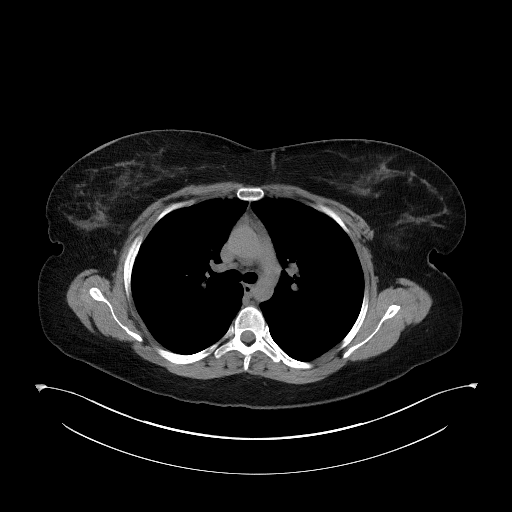
[im 108/128  lung]
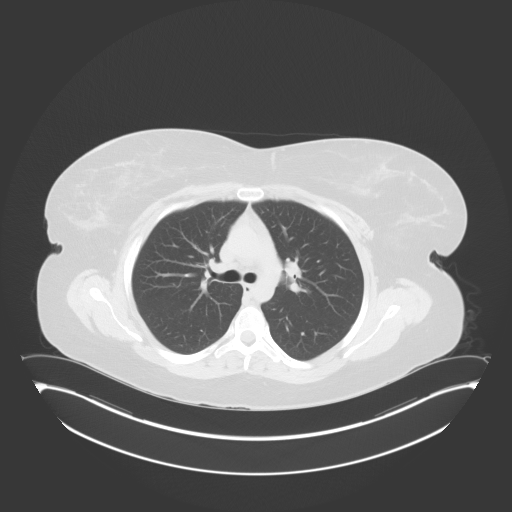
[im 118/128  lung]
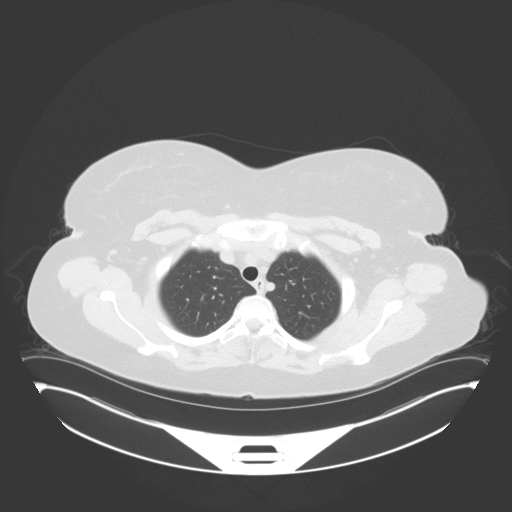

[Series 5: coronal · coronal · 0.81mm/px · 3 of 147 slices shown]
[im 30/147  lung]
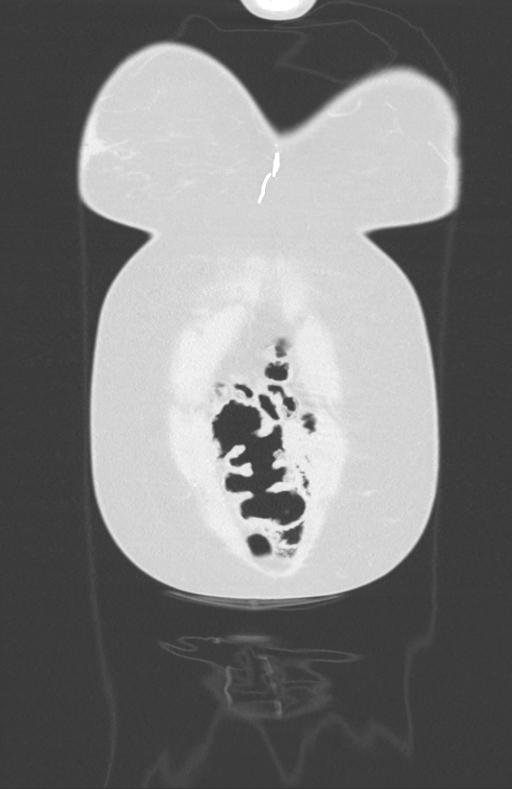
[im 59/147  lung]
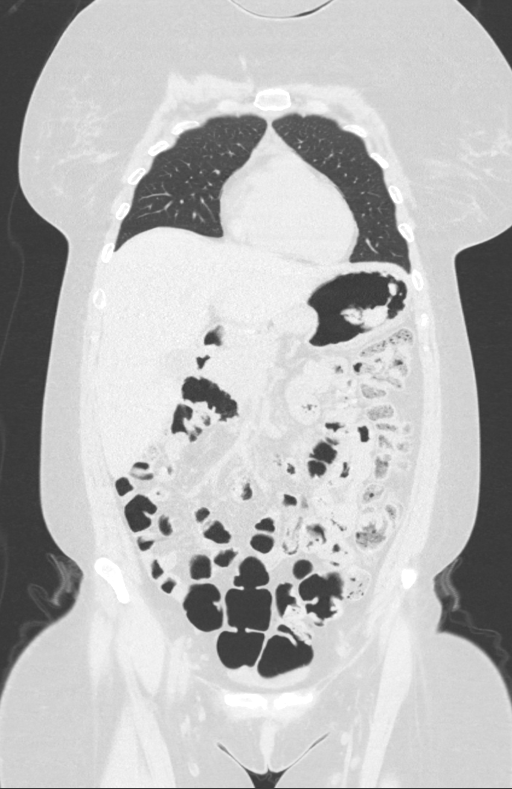
[im 88/147  lung]
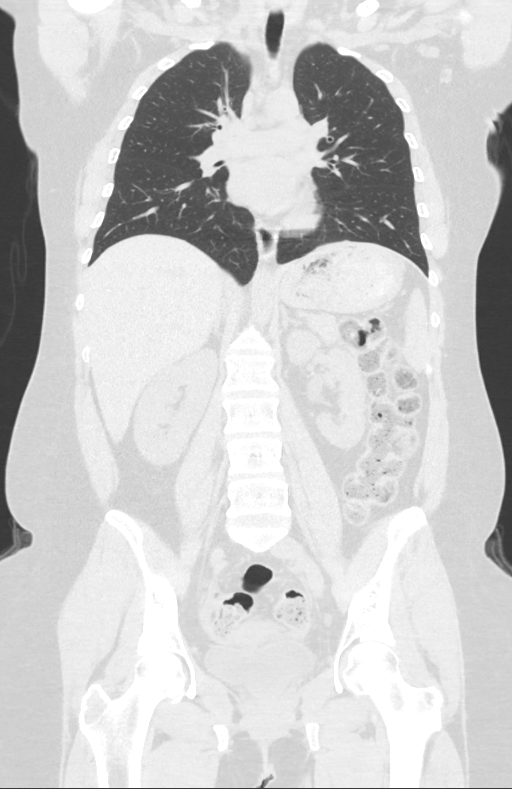

[13 of 36 positions shown; findings below may reference images not displayed]

FINDINGS: Evaluation of this exam is limited in the absence of intravenous
contrast.

CT CHEST FINDINGS

Cardiovascular: There is no cardiomegaly or pericardial effusion.
The thoracic aorta and central pulmonary arteries are grossly
unremarkable.

Mediastinum/Nodes: No hilar or mediastinal adenopathy. No fluid
collection. The esophagus appears grossly unremarkable. No
esophageal wall thickening or evidence of perforation. The thyroid
gland is grossly unremarkable.

Lungs/Pleura: The lungs are clear. There is no pleural effusion or
pneumothorax. The central airways are patent.

Musculoskeletal: No chest wall mass or suspicious bone lesions
identified.

CT ABDOMEN PELVIS FINDINGS

No intra-abdominal free air or free fluid

Hepatobiliary: No focal liver abnormality is seen. No gallstones,
gallbladder wall thickening, or biliary dilatation.

Pancreas: Unremarkable. No pancreatic ductal dilatation or
surrounding inflammatory changes.

Spleen: Normal in size without focal abnormality.

Adrenals/Urinary Tract: Adrenal glands are unremarkable. Kidneys are
normal, without renal calculi, focal lesion, or hydronephrosis.
Bladder is unremarkable.

Stomach/Bowel: There is a linear metallic density measuring 3.7 cm
in length in the proximal stomach likely corresponding to the
swallowed razor blade. Small high attenuating content mixed with
gastric content adjacent to the razor blade is concerning for small
amount of hemorrhage. There is no evidence of stop gastric wall
discontinuity or perforation. Moderate stool noted throughout the
colon. No evidence of bowel obstruction or active inflammation. The
cecum is positioned in the lower abdomen in the midline, likely a
mobile cecum. Appendectomy.

Vascular/Lymphatic: The abdominal aorta and IVC are grossly
unremarkable on this noncontrast study. No portal venous gas
identified. There is no adenopathy.

Reproductive: The uterus and ovaries are grossly unremarkable.

Other: Midline vertical anterior abdominal wall incisional scar with
a small fat containing umbilical hernia. No fluid collection or
active inflammation.

Musculoskeletal: Small subcutaneous metallic wires in the anterior
chest wall. No associated inflammatory changes or fluid collection.
These appear to be chronic and present on radiographs dating back to
[DATE]. Bilateral L4 pars defects. No listhesis. No acute
osseous pathology.
IMPRESSION: Metallic density in the proximal stomach corresponding to the
ingested razor blade. There appears to be small amount of hemorrhage
surrounding the razor blade within the stomach without definite
evidence of perforation. The esophagus is intact

as well.

Small metallic wires in the subcutaneous soft tissues of the
anterior chest without associated inflammatory changes or fluid
collection.

These results were called by telephone at the time of interpretation
on [DATE] at [DATE] to Dr. RAINA , who verbally
acknowledged these results.

CONTRAST:  100 cc Isovue 370

## 2016-05-06 IMAGING — CR DG ABDOMEN 1V
1 series · 2 of 2 positions shown · non-contrast
Comparison: Prior CT from earlier same day.

CLINICAL DATA: Follow-up examination status post extraction of
razor blade in stomach. Evaluate for additional foreign bodies.

EXAM:
ABDOMEN - 1 VIEW

[Series 17: t abdomen supine · 0.14mm/px · 2 of 2 slices shown]
[im 1/2]
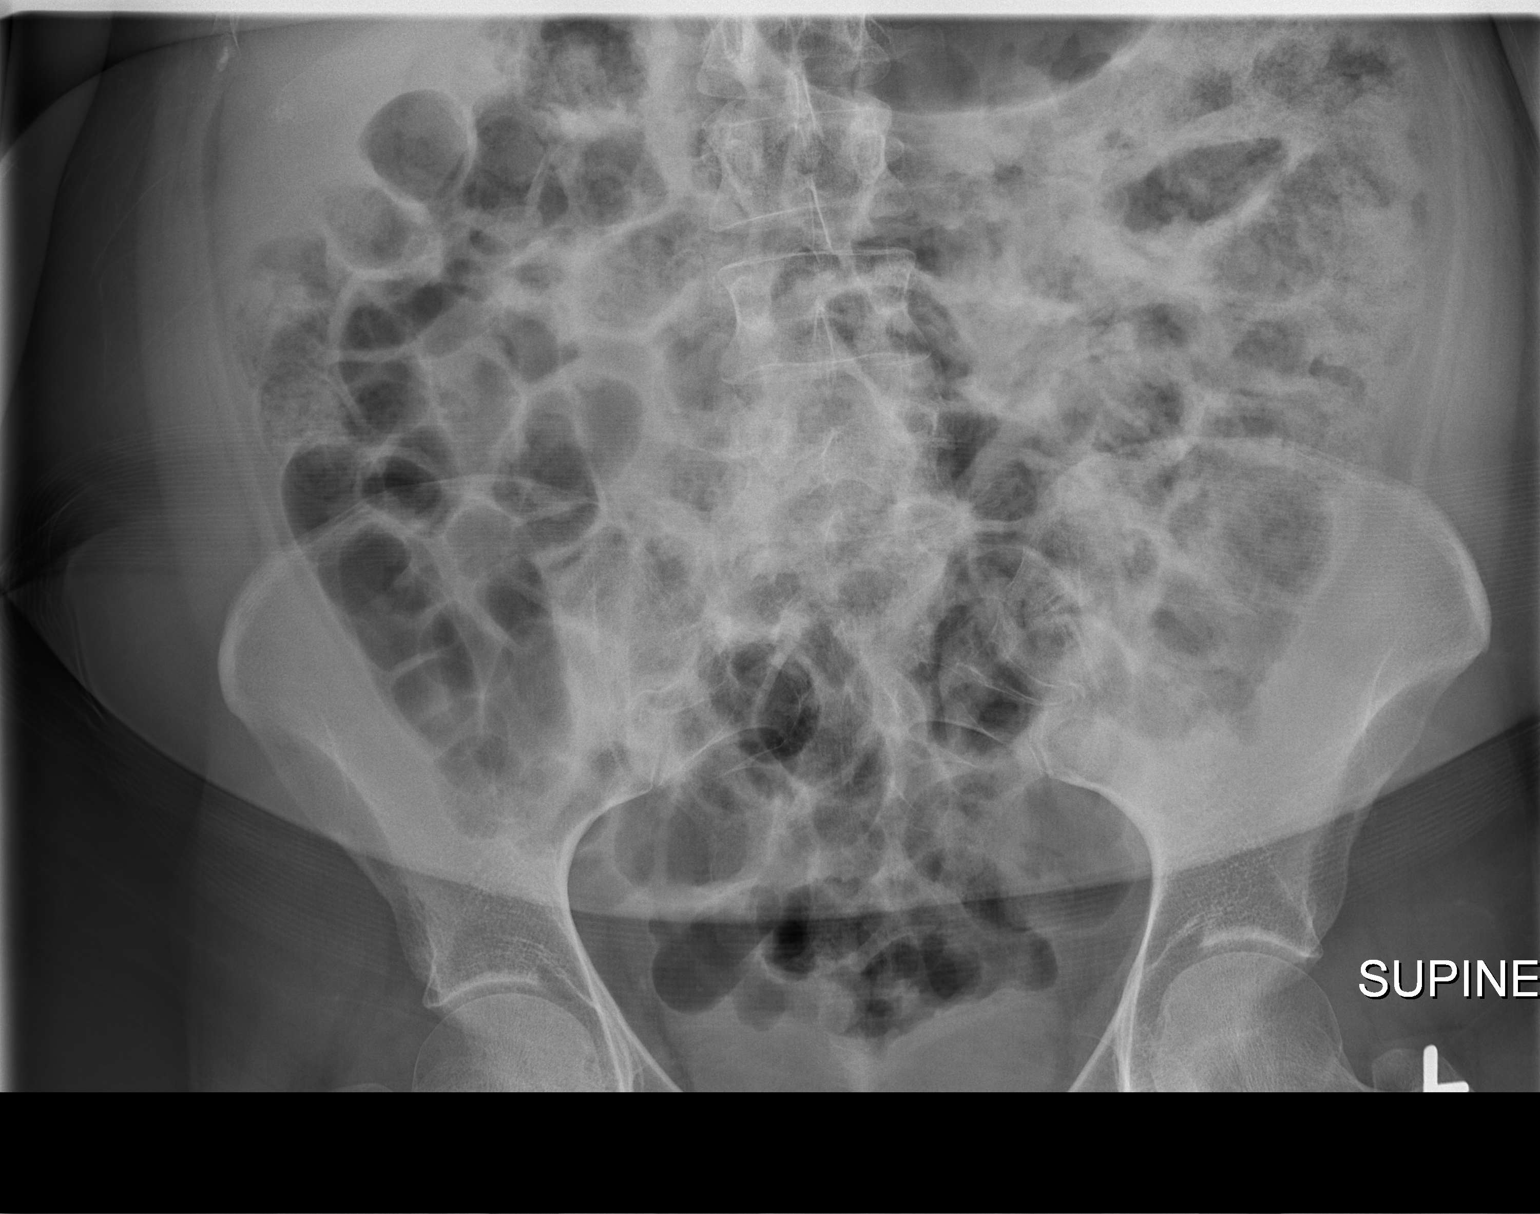
[im 2/2]
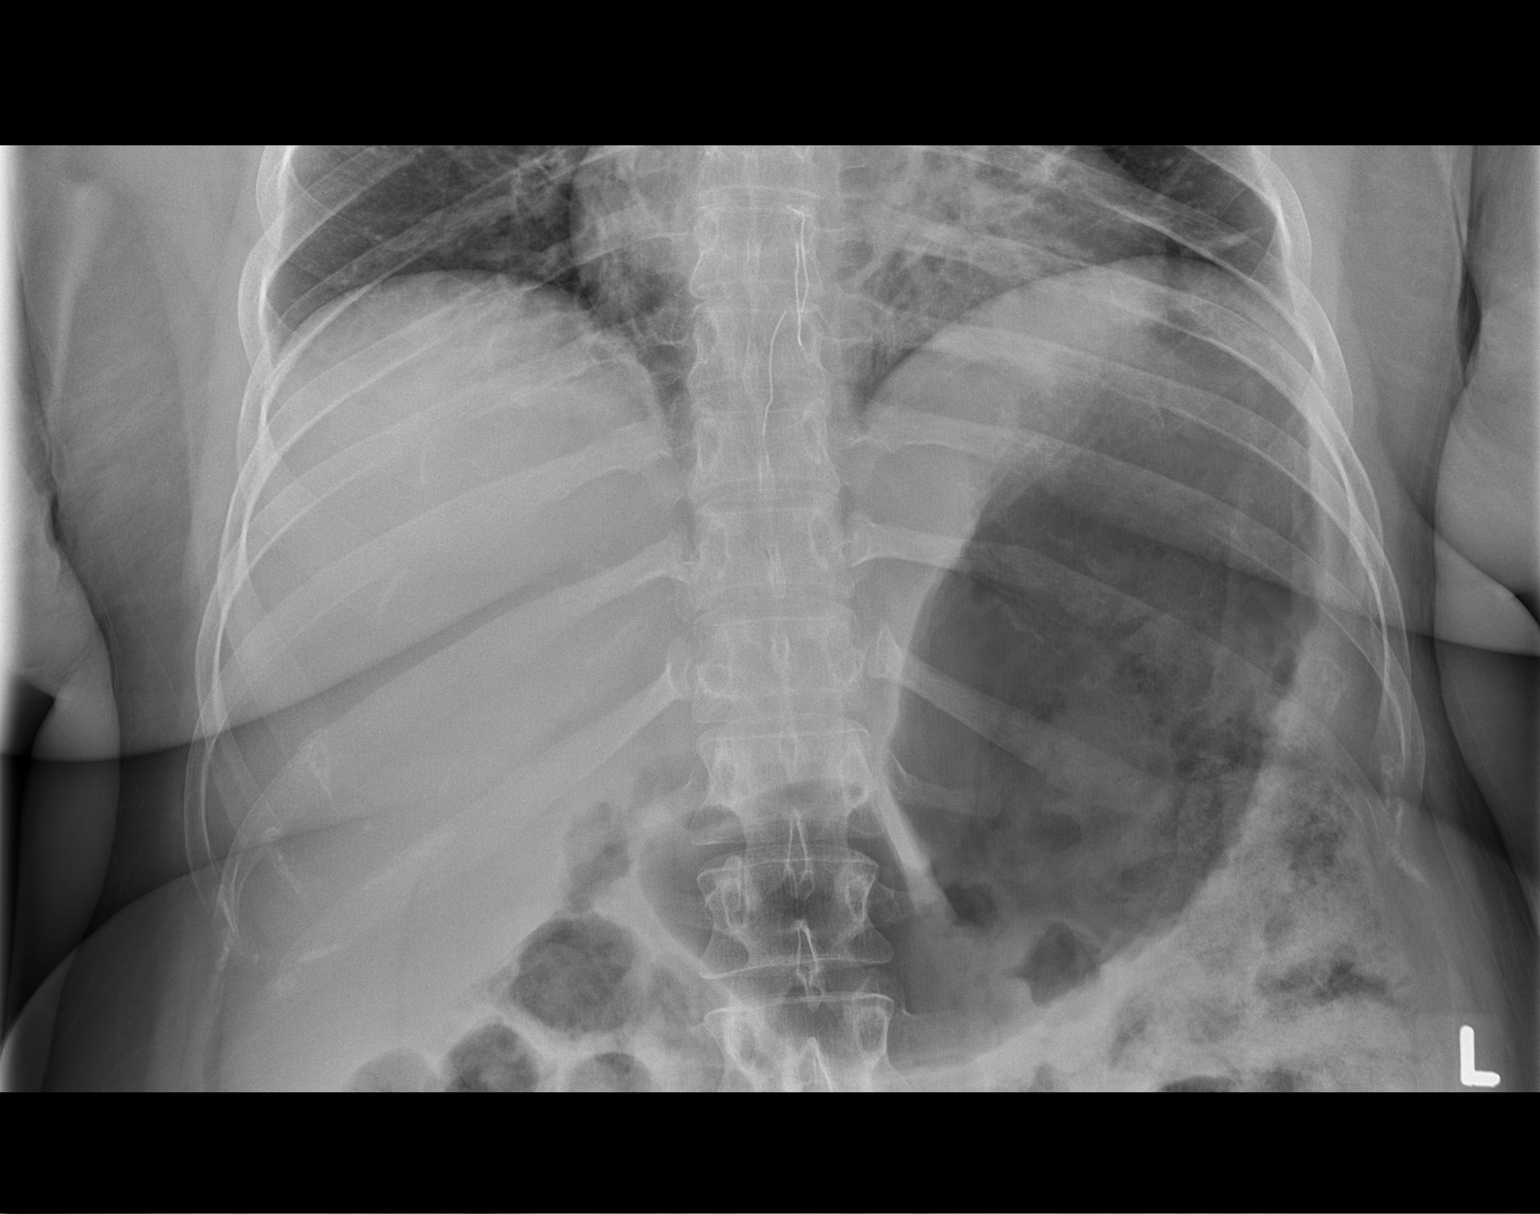

[2 of 2 positions shown; findings below may reference images not displayed]

FINDINGS: Gaseous distension of the stomach, suspected to be related to
insufflation from recent procedure. No radiopaque foreign bodies
identified within the stomach. No other radiopaque foreign body seen
elsewhere within the bowels. Diffuse gaseous distension of the
bowels likely related to procedure. No other acute abnormality.

Mild atelectatic changes noted at the left lung base.
IMPRESSION: 1. No other radiopaque foreign bodies identified within the stomach
or elsewhere within the abdomen.
2. Three retained wire densities in the region of the distal
esophagus, similar to previous studies. These appear to lie within
the subcutaneous fat of the lower anterior chest on prior CT.
3. Diffuse gaseous distension of the stomach and bowels, likely
related to insufflation from procedure.

## 2016-05-06 SURGERY — EGD (ESOPHAGOGASTRODUODENOSCOPY)
Anesthesia: General | Site: Mouth | Wound class: Clean Contaminated

## 2016-05-06 MED ORDER — AMANTADINE HCL 100 MG PO CAPS
100.0000 mg | ORAL_CAPSULE | Freq: Two times a day (BID) | ORAL | Status: DC
  Administered 2016-05-06 – 2016-05-07 (×3): 100 mg via ORAL
  Filled 2016-05-06 (×5): qty 1

## 2016-05-06 MED ORDER — ROCURONIUM BROMIDE 50 MG/5ML IV SOSY
PREFILLED_SYRINGE | INTRAVENOUS | Status: AC
  Filled 2016-05-06: qty 5

## 2016-05-06 MED ORDER — LURASIDONE HCL 40 MG PO TABS
120.0000 mg | ORAL_TABLET | Freq: Every evening | ORAL | Status: DC
  Administered 2016-05-06: 120 mg via ORAL
  Filled 2016-05-06 (×2): qty 3

## 2016-05-06 MED ORDER — LACTATED RINGERS IV SOLN
INTRAVENOUS | Status: DC | PRN
  Administered 2016-05-06: 02:00:00 via INTRAVENOUS

## 2016-05-06 MED ORDER — LEVOTHYROXINE SODIUM 75 MCG PO TABS
75.0000 ug | ORAL_TABLET | Freq: Every day | ORAL | Status: DC
  Administered 2016-05-06 – 2016-05-07 (×2): 75 ug via ORAL
  Filled 2016-05-06: qty 2
  Filled 2016-05-06: qty 1

## 2016-05-06 MED ORDER — PROPOFOL 10 MG/ML IV BOLUS
INTRAVENOUS | Status: DC | PRN
  Administered 2016-05-06: 200 mg via INTRAVENOUS

## 2016-05-06 MED ORDER — LIDOCAINE 2% (20 MG/ML) 5 ML SYRINGE
INTRAMUSCULAR | Status: AC
  Filled 2016-05-06: qty 5

## 2016-05-06 MED ORDER — SUCCINYLCHOLINE CHLORIDE 20 MG/ML IJ SOLN
INTRAMUSCULAR | Status: DC | PRN
  Administered 2016-05-06: 100 mg via INTRAVENOUS

## 2016-05-06 MED ORDER — FLUPHENAZINE HCL 5 MG PO TABS
5.0000 mg | ORAL_TABLET | Freq: Two times a day (BID) | ORAL | Status: DC
  Administered 2016-05-06 – 2016-05-07 (×3): 5 mg via ORAL
  Filled 2016-05-06 (×5): qty 1

## 2016-05-06 MED ORDER — OXCARBAZEPINE 150 MG PO TABS
150.0000 mg | ORAL_TABLET | Freq: Two times a day (BID) | ORAL | Status: DC
  Administered 2016-05-06 – 2016-05-07 (×3): 150 mg via ORAL
  Filled 2016-05-06 (×5): qty 1

## 2016-05-06 MED ORDER — ONDANSETRON HCL 4 MG/2ML IJ SOLN
INTRAMUSCULAR | Status: AC
  Filled 2016-05-06: qty 2

## 2016-05-06 MED ORDER — FENTANYL CITRATE (PF) 100 MCG/2ML IJ SOLN
INTRAMUSCULAR | Status: AC
  Filled 2016-05-06: qty 2

## 2016-05-06 MED ORDER — ONDANSETRON HCL 4 MG/2ML IJ SOLN
INTRAMUSCULAR | Status: DC | PRN
Start: 2016-05-06 — End: 2016-05-06
  Administered 2016-05-06: 4 mg via INTRAVENOUS

## 2016-05-06 MED ORDER — LIDOCAINE HCL (CARDIAC) 20 MG/ML IV SOLN
INTRAVENOUS | Status: DC | PRN
  Administered 2016-05-06: 100 mg via INTRAVENOUS

## 2016-05-06 MED ORDER — TRAZODONE HCL 50 MG PO TABS
50.0000 mg | ORAL_TABLET | Freq: Every day | ORAL | Status: DC
  Administered 2016-05-06: 50 mg via ORAL
  Filled 2016-05-06: qty 1

## 2016-05-06 MED ORDER — FLUPHENAZINE HCL 5 MG PO TABS
10.0000 mg | ORAL_TABLET | Freq: Every day | ORAL | Status: DC
  Administered 2016-05-06: 10 mg via ORAL
  Filled 2016-05-06 (×3): qty 1

## 2016-05-06 MED ORDER — VENLAFAXINE HCL ER 75 MG PO CP24
150.0000 mg | ORAL_CAPSULE | Freq: Every day | ORAL | Status: DC
  Administered 2016-05-06: 75 mg via ORAL
  Administered 2016-05-07: 150 mg via ORAL
  Filled 2016-05-06 (×2): qty 2

## 2016-05-06 MED ORDER — FENTANYL CITRATE (PF) 100 MCG/2ML IJ SOLN
INTRAMUSCULAR | Status: DC | PRN
  Administered 2016-05-06: 50 ug via INTRAVENOUS

## 2016-05-06 MED ORDER — PROPOFOL 10 MG/ML IV BOLUS
INTRAVENOUS | Status: AC
  Filled 2016-05-06: qty 20

## 2016-05-06 MED ORDER — AMLODIPINE BESYLATE 5 MG PO TABS
2.5000 mg | ORAL_TABLET | Freq: Every day | ORAL | Status: DC
  Administered 2016-05-06 – 2016-05-07 (×2): 2.5 mg via ORAL
  Filled 2016-05-06 (×2): qty 1

## 2016-05-06 NOTE — ED Notes (Signed)
Attempted to call Group home at [phone] or [phone] with no answer at either number.  Will attempt to call again.

## 2016-05-06 NOTE — Progress Notes (Signed)
GI endoscopy note  Razor blade removed from stomach . Large qty of food in stomach   Plan:  1. Return to ER for ongoing care 2. Suggest X ray abdomen to ensure no other foreign objects are present within the large qty of food seen in the stomach.    Dr Jonathon Bellows  Gastroenterology/Hepatology Pager: [phone]

## 2016-05-06 NOTE — ED Notes (Signed)
Attempted to call ED social worker but unable to get in touch with her at this time.
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
## 2016-05-06 NOTE — ED Notes (Signed)
Pt states took razor blade around 6pm.

## 2016-05-06 NOTE — ED Notes (Signed)
MD at the bed side Plan to IVC due to pt states she wants to kill herself. Contract with MD and staff not to swallow anything.

## 2016-05-06 NOTE — Anesthesia Postprocedure Evaluation (Signed)
Anesthesia Post Note  Patient: Melanie Bell  Procedure(s) Performed: Procedure(s) (LRB): ESOPHAGOGASTRODUODENOSCOPY (EGD) (N/A)  Patient location during evaluation: Endoscopy Anesthesia Type: General Level of consciousness: awake and alert Pain management: pain level controlled Vital Signs Assessment: post-procedure vital signs reviewed and stable Respiratory status: spontaneous breathing, nonlabored ventilation, respiratory function stable and patient connected to nasal cannula oxygen Cardiovascular status: blood pressure returned to baseline and stable Postop Assessment: no signs of nausea or vomiting Anesthetic complications: no     Last Vitals:  Vitals:   05/06/16 0549 05/06/16 0601  BP:  118/68  Pulse:  76  Resp: 18 18  Temp:  36.6 C    Last Pain:  Vitals:   05/06/16 0601  TempSrc: Oral  PainSc:                  Martha Clan

## 2016-05-06 NOTE — ED Notes (Signed)
Called Phone numbers in chart for Caregivers and Legal Guardian no answer.
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
## 2016-05-06 NOTE — ED Notes (Signed)
Pt in hallway bed in front of nsg station.

## 2016-05-06 NOTE — ED Notes (Signed)
Pt given meal tray and juice. 

## 2016-05-06 NOTE — ED Notes (Signed)
[employer] at bedside 
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
## 2016-05-06 NOTE — ED Notes (Signed)
Patient assigned to appropriate care area. Patient oriented to unit/care area: Informed that, for their safety, care areas are designed for safety and monitored by security cameras at all times; and visiting hours explained to patient. Patient verbalizes understanding, and verbal contract for safety obtained. 

## 2016-05-06 NOTE — Anesthesia Preprocedure Evaluation (Signed)
Anesthesia Evaluation  Patient identified by MRN, date of birth, ID band Patient awake    Reviewed: Allergy & Precautions, H&P , NPO status , Patient's Chart, lab work & pertinent test results, reviewed documented beta blocker date and time   History of Anesthesia Complications Negative for: history of anesthetic complications  Airway Mallampati: II  TM Distance: >3 FB Neck ROM: full    Dental  (+) Teeth Intact   Pulmonary neg pulmonary ROS, asthma , Current Smoker,    Pulmonary exam normal        Cardiovascular hypertension, negative cardio ROS Normal cardiovascular exam Rhythm:regular Rate:Normal     Neuro/Psych negative neurological ROS  negative psych ROS   GI/Hepatic negative GI ROS, Neg liver ROS, GERD  Medicated,  Endo/Other  negative endocrine ROSHypothyroidism   Renal/GU negative Renal ROS  negative genitourinary   Musculoskeletal   Abdominal   Peds  Hematology negative hematology ROS (+)   Anesthesia Other Findings Past Medical History: No date: Anxiety No date: Asthma No date: Depression No date: GERD (gastroesophageal reflux disease) 09/30/2014: Hallucinations     Comment: Sizoaffective No date: Hyperlipidemia No date: Hypertension 10/2014: Tardive dyskinesia     Comment: recent onset Past Surgical History: No date: ABDOMINAL SURGERY     Comment: "years ago" to remove foreign objects No date: APPENDECTOMY No date: BREAST LUMPECTOMY Right 09/10/2015: COLONOSCOPY WITH PROPOFOL N/A     Comment: Procedure: COLONOSCOPY WITH PROPOFOL;                Surgeon: [name], MD;  Location[facility]              ENDOSCOPY;  Service: Endoscopy;  Laterality:               N/A; 11/28/2014: ESOPHAGOGASTRODUODENOSCOPY N/A     Comment: Procedure: ESOPHAGOGASTRODUODENOSCOPY (EGD);                Surgeon: [name], MD;  Location: [facility]               ENDOSCOPY;  Service: Endoscopy;  Laterality:         N/A; 02/21/2016: ESOPHAGOGASTRODUODENOSCOPY N/A     Comment: Procedure: ESOPHAGOGASTRODUODENOSCOPY (EGD);                Surgeon: Mauri Pole, MD;  Location: [facility]               ENDOSCOPY;  Service: Endoscopy;  Laterality:               N/A; 09/12/2015: LAPAROTOMY N/A     Comment: Procedure: EXPLORATORY LAPAROTOMY;  Surgeon:               Florene Glen, MD;  Location: [facility] ORS;                Service: General;  Laterality: N/A; 09/12/2015: SIGMOIDOSCOPY N/A     Comment: Procedure: SIGMOIDOSCOPY;  Surgeon: Florene Glen, MD;  Location: [facility] ORS;  Service:               General;  Laterality: N/A; No date: WISDOM TOOTH EXTRACTION BMI    Body Mass Index:  32.28 kg/m     Reproductive/Obstetrics negative OB ROS                             Anesthesia Physical  Anesthesia  Plan  ASA: II and emergent  Anesthesia Plan: General ETT, Rapid Sequence and Cricoid Pressure   Post-op Pain Management:    Induction:   Airway Management Planned:   Additional Equipment:   Intra-op Plan:   Post-operative Plan:   Informed Consent: I have reviewed the patients History and Physical, chart, labs and discussed the procedure including the risks, benefits and alternatives for the proposed anesthesia with the patient or authorized representative who has indicated his/her understanding and acceptance.   Dental Advisory Given  Plan Discussed with: CRNA  Anesthesia Plan Comments:         Anesthesia Quick Evaluation

## 2016-05-06 NOTE — ED Notes (Signed)
Pt transported to the [place] by [facility] without difficulty.

## 2016-05-06 NOTE — Progress Notes (Signed)
LCSW called Group home [phone] and there was no answer. LCSW conveyed to ED nurse group home provider may not be able to reach her.   [employer] LCSW [phone]

## 2016-05-06 NOTE — ED Notes (Signed)
Attempted to call group home at this time with no answer.  Will continue to try.
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
## 2016-05-06 NOTE — ED Notes (Addendum)
Pt moved into hallway for safety per MD. No vitals except routine per MD.

## 2016-05-06 NOTE — ED Notes (Signed)
Pt back from OR. Report from Amy RN in PACU. PT Pt denies any pain. Alert and orient. No distresss noted. Will continue to monitor.
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
## 2016-05-06 NOTE — Op Note (Signed)
[facility] Gastroenterology Patient [name] Procedure Date: 05/06/2016 1:42 AM MRN: [id number] Account #: [id number] Date of Birth: Jan 18, 1991 Admit Type: Outpatient Age: 25 Room: [facility] ENDO ROOM 4 Gender: Female Note Status: Finalized Procedure:            Upper GI endoscopy Indications:          Foreign body in the stomach Providers:            Jonathon Bellows MD, MD Referring MD:         No Local Md, MD (Referring MD) Medicines:            Monitored Anesthesia Care Complications:        No immediate complications. Procedure:            Pre-Anesthesia Assessment:                       - Prior to the procedure, a History and Physical was                        performed, and patient medications, allergies and                        sensitivities were reviewed. The patient's tolerance of                        previous anesthesia was reviewed.                       - The risks and benefits of the procedure and the                        sedation options and risks were discussed with the                        patient. All questions were answered and informed                        consent was obtained.                       - The risks and benefits of the procedure and the                        sedation options and risks were discussed with the                        patient. All questions were answered and informed                        consent was obtained.                       - ASA Grade Assessment: II - A patient with mild                        systemic disease.                       After obtaining informed consent, the endoscope was  passed under direct vision. Throughout the procedure,                        the patient's blood pressure, pulse, and oxygen                        saturations were monitored continuously. The Endoscope                        was introduced through the mouth, and advanced to the              third part of duodenum. The upper GI endoscopy was                        accomplished with ease. The patient tolerated the                        procedure well. Findings:      The examined duodenum was normal.      The esophagus was normal.      Razor blade, lots of food were found in the gastric antrum. Removal was       accomplished with a Roth net.      After removal of razor blade, the endoscope was reintroduced and the       esophageal lumen , stomach was rexamined, no evidence of any trauma was       noted. Impression:           - Normal examined duodenum.                       - Normal esophagus.                       - Razor blade, lots of food were found in the stomach.                        Removal was successful. Recommendation:       - - Return patient to Emergency room for ongoing care                        and psychiatric evaluation                       I also suggest repeat X ray abdomen to ensure no other                        foreign objects in the stomach as there was a lot of                        food content and there is always a possibility that                        there may be other objects buried within[Reason]. Procedure Code(s):    --- Professional ---                       [phone], Esophagogastroduodenoscopy, flexible, transoral;                        with removal of foreign body(s) Diagnosis Code(s):    ---  Professional ---                       [id number], Foreign body in stomach, initial encounter CPT copyright 2016 American Medical Association. All rights reserved. The codes documented in this report are preliminary and upon coder review may  be revised to meet current compliance requirements. Jonathon Bellows, MD Jonathon Bellows MD, MD 05/06/2016 2:01:43 AM This report has been signed electronically. Number of Addenda: 0 Note Initiated On: 05/06/2016 1:42 AM      [facility]

## 2016-05-06 NOTE — Anesthesia Procedure Notes (Signed)
Procedure Name: Intubation Performed by: Daryl Quiros Pre-anesthesia Checklist: Patient identified, Patient being monitored, Timeout performed, Emergency Drugs available and Suction available Patient Re-evaluated:Patient Re-evaluated prior to inductionOxygen Delivery Method: Circle system utilized Preoxygenation: Pre-oxygenation with 100% oxygen Intubation Type: IV induction, Rapid sequence and Cricoid Pressure applied Laryngoscope Size: Mac and 3 Grade View: Grade I Tube type: Oral Tube size: 7.0 mm Number of attempts: 1 Airway Equipment and Method: Stylet Placement Confirmation: ETT inserted through vocal cords under direct vision,  positive ETCO2 and breath sounds checked- equal and bilateral Secured at: 21 cm Tube secured with: Tape Dental Injury: Teeth and Oropharynx as per pre-operative assessment        

## 2016-05-06 NOTE — Transfer of Care (Signed)
Immediate Anesthesia Transfer of Care Note  Patient: Melanie Bell  Procedure(s) Performed: Procedure(s): ESOPHAGOGASTRODUODENOSCOPY (EGD) (N/A)  Patient Location: PACU  Anesthesia Type:General  Level of Consciousness: awake and alert   Airway & Oxygen Therapy: Patient Spontanous Breathing and Patient connected to face mask oxygen  Post-op Assessment: Report given to RN and Post -op Vital signs reviewed and stable  Post vital signs: Reviewed and stable  Last Vitals:  Vitals:   05/05/16 2243 05/06/16 0111  BP: 121/72 138/75  Pulse: 97 75  Resp: 20 18  Temp: 36.7 C 36.7 C    Last Pain:  Vitals:   05/06/16 0111  TempSrc: Oral  PainSc:          Complications: No apparent anesthesia complications
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
## 2016-05-06 NOTE — Discharge Instructions (Signed)
Return to the ER for abdominal pain, fever, vomiting blood, or dark tarry stools as these could be complications of your swallowed razor blade.

## 2016-05-06 NOTE — ED Notes (Signed)
Supper tray given.

## 2016-05-06 NOTE — ED Notes (Signed)
Called pharmacy to send prolixin

## 2016-05-06 NOTE — ED Notes (Signed)
Melanie Bell reports  POC pregancy negative.

## 2016-05-06 NOTE — ED Provider Notes (Signed)
-----------------------------------------   3:52 PM on 05/06/2016 -----------------------------------------   Blood pressure 119/71, pulse 73, temperature 98.1 F (36.7 C), resp. rate 18, height 5\' 6"  (1.676 m), weight 195 lb (88.5 kg), SpO2 98 %.  Patient underwent EGD procedure with successful remover of a razor blade from the stomach and no evidence of perforation. Patient has been stable. Patient's been evaluated by psychiatry and cleared for discharge. Dr. Weber Cooks has lifted IVC. Labs with no acute findigns.   Melanie Re, MD 05/06/16 [phone]
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
## 2016-05-07 NOTE — ED Notes (Signed)
Pt ambulated to the bathroom to void. Pt returned to her room without difficulty.
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
## 2016-05-07 NOTE — Progress Notes (Signed)
LCSW consulted with Dr Reita Cliche EDP and explained Group home provider situation. Will keep EDP posted once they are reached.   [employer] LCSW [phone]

## 2016-05-07 NOTE — ED Provider Notes (Signed)
Vitals:   05/06/16 1430 05/07/16 0642  BP: 119/71 114/65  Pulse: 73 66  Resp: 18 18  Temp:  98 F (36.7 C)    I spoke with Claudine, Education officer, museum, patient was released from IVC yesterday for intended discharge to group home, however the patients room apparently needs window fixed (from where she broke out), and this may be delayed due to Christmas holiday.  Social worker continuing to work on completing her disposition from the ER as soon as possible given patient no additional psychiatric or medical need for being in the hospital.   Lisa Roca, MD 05/07/16 [phone]

## 2016-05-07 NOTE — ED Notes (Signed)

## 2016-05-07 NOTE — Progress Notes (Signed)
LCSW received call from group home provider and she was directed to pick up patient in emergency department enterance. ED BHU nurse got patient ready and walked her to [facility]. No further needs.   [employer] LCSW [phone]

## 2016-05-07 NOTE — ED Notes (Signed)
Patient discharged ambulatory to group home. She denies SI or HI. Discharge instructions reviewed with patient and caregiver, they verbalize understanding. Patient received all personal belongings.

## 2016-05-07 NOTE — ED Notes (Signed)
Patient resting quietly in room. No noted distress or abnormal behaviors noted. Will continue 15 minute checks and observation by security camera for safety. 

## 2016-05-07 NOTE — Progress Notes (Signed)
LCSW-Called Ms Oswaldo Milian and she will be here at 1-2pm to pick up patient.  [employer] LCSW [phone]

## 2016-05-07 NOTE — Progress Notes (Signed)
Group Home provider Ms Oswaldo Milian called me stating that patient again is verbally abusive and making threats of self harm. It was explained to group home provider that the act team needs to be contacted immediatly and her guardian. This worker had a discussion on the cell phone explaining to [place] that she has other coping skills to use and she needs to rest now and not get conflicted. She was informed her act team is looking for other group home options at this time. It was also clearly explained to [place] that she is not to make threats or raise her voice to the staff but rather go to her room and rest. Ms Oswaldo Milian stated that she will document this conversation and that the patient is suicidal. She was advised to bring her back but to reach out to community supports first as patient has lied several times to remain in the hospital.  Enis Slipper LCSW [phone]

## 2016-05-10 ENCOUNTER — Observation Stay
Admission: EM | Admit: 2016-05-10 | Discharge: 2016-05-13 | Disposition: A | Discharge status: Home / Self Care | Payer: Medicaid Other | Attending: Internal Medicine | Admitting: Internal Medicine

## 2016-05-10 ENCOUNTER — Emergency Department: Payer: Medicaid Other

## 2016-05-10 DIAGNOSIS — Z91013 Allergy to seafood: Secondary | ICD-10-CM | POA: Insufficient documentation

## 2016-05-10 DIAGNOSIS — J45909 Unspecified asthma, uncomplicated: Secondary | ICD-10-CM | POA: Diagnosis not present

## 2016-05-10 DIAGNOSIS — E039 Hypothyroidism, unspecified: Secondary | ICD-10-CM | POA: Insufficient documentation

## 2016-05-10 DIAGNOSIS — Z818 Family history of other mental and behavioral disorders: Secondary | ICD-10-CM | POA: Insufficient documentation

## 2016-05-10 DIAGNOSIS — X788XXA Intentional self-harm by other sharp object, initial encounter: Secondary | ICD-10-CM | POA: Diagnosis not present

## 2016-05-10 DIAGNOSIS — T184XXA Foreign body in colon, initial encounter: Secondary | ICD-10-CM | POA: Diagnosis not present

## 2016-05-10 DIAGNOSIS — E785 Hyperlipidemia, unspecified: Secondary | ICD-10-CM | POA: Insufficient documentation

## 2016-05-10 DIAGNOSIS — K219 Gastro-esophageal reflux disease without esophagitis: Secondary | ICD-10-CM | POA: Insufficient documentation

## 2016-05-10 DIAGNOSIS — T182XXA Foreign body in stomach, initial encounter: Principal | ICD-10-CM | POA: Diagnosis present

## 2016-05-10 DIAGNOSIS — F419 Anxiety disorder, unspecified: Secondary | ICD-10-CM | POA: Insufficient documentation

## 2016-05-10 DIAGNOSIS — Z91041 Radiographic dye allergy status: Secondary | ICD-10-CM | POA: Insufficient documentation

## 2016-05-10 DIAGNOSIS — S90852A Superficial foreign body, left foot, initial encounter: Secondary | ICD-10-CM | POA: Insufficient documentation

## 2016-05-10 DIAGNOSIS — F250 Schizoaffective disorder, bipolar type: Secondary | ICD-10-CM | POA: Diagnosis not present

## 2016-05-10 DIAGNOSIS — T17908A Unspecified foreign body in respiratory tract, part unspecified causing other injury, initial encounter: Secondary | ICD-10-CM

## 2016-05-10 DIAGNOSIS — I10 Essential (primary) hypertension: Secondary | ICD-10-CM | POA: Diagnosis not present

## 2016-05-10 DIAGNOSIS — T189XXA Foreign body of alimentary tract, part unspecified, initial encounter: Secondary | ICD-10-CM

## 2016-05-10 DIAGNOSIS — M795 Residual foreign body in soft tissue: Secondary | ICD-10-CM

## 2016-05-10 DIAGNOSIS — F17210 Nicotine dependence, cigarettes, uncomplicated: Secondary | ICD-10-CM | POA: Insufficient documentation

## 2016-05-10 DIAGNOSIS — T17900A Unspecified foreign body in respiratory tract, part unspecified causing asphyxiation, initial encounter: Secondary | ICD-10-CM

## 2016-05-10 LAB — COMPREHENSIVE METABOLIC PANEL
ALBUMIN: 3.9 g/dL (ref 3.5–5.0)
ALT: 8 U/L — ABNORMAL LOW (ref 14–54)
AST: 48 U/L — AB (ref 15–41)
Alkaline Phosphatase: 77 U/L (ref 38–126)
Anion gap: 7 (ref 5–15)
BUN: 13 mg/dL (ref 6–20)
CHLORIDE: 105 mmol/L (ref 101–111)
CO2: 25 mmol/L (ref 22–32)
Calcium: 9.2 mg/dL (ref 8.9–10.3)
Creatinine, Ser: 1.19 mg/dL — ABNORMAL HIGH (ref 0.44–1.00)
GFR calc Af Amer: >60 mL/min (ref >60)
GFR calc non Af Amer: >60 mL/min (ref >60)
GLUCOSE: 108 mg/dL — AB (ref 65–99)
POTASSIUM: 3.6 mmol/L (ref 3.5–5.1)
SODIUM: 137 mmol/L (ref 135–145)
Total Bilirubin: 0.4 mg/dL (ref 0.3–1.2)
Total Protein: 7.2 g/dL (ref 6.5–8.1)

## 2016-05-10 LAB — CBC
HCT: 38.6 % (ref 35.0–47.0)
Hemoglobin: 13.3 g/dL (ref 12.0–16.0)
MCH: 31.3 pg (ref 26.0–34.0)
MCHC: 34.5 g/dL (ref 32.0–36.0)
MCV: 90.8 fL (ref 80.0–100.0)
PLATELETS: 246 10*3/uL (ref 150–440)
RBC: 4.25 MIL/uL (ref 3.80–5.20)
RDW: 13.9 % (ref 11.5–14.5)
WBC: 9.6 10*3/uL (ref 3.6–11.0)

## 2016-05-10 LAB — URINE DRUG SCREEN, QUALITATIVE (ARMC ONLY)
Amphetamines, Ur Screen: NOT DETECTED
Barbiturates, Ur Screen: NOT DETECTED
Benzodiazepine, Ur Scrn: NOT DETECTED
COCAINE METABOLITE, UR ~~LOC~~: NOT DETECTED
Cannabinoid 50 Ng, Ur ~~LOC~~: NOT DETECTED
MDMA (ECSTASY) UR SCREEN: NOT DETECTED
Methadone Scn, Ur: NOT DETECTED
OPIATE, UR SCREEN: NOT DETECTED
PHENCYCLIDINE (PCP) UR S: NOT DETECTED
Tricyclic, Ur Screen: NOT DETECTED

## 2016-05-10 LAB — ETHANOL: Alcohol, Ethyl (B): <5 mg/dL (ref <5)

## 2016-05-10 LAB — POCT PREGNANCY, URINE: PREG TEST UR: NEGATIVE

## 2016-05-10 LAB — LIPASE, BLOOD: LIPASE: 27 U/L (ref 11–51)

## 2016-05-10 IMAGING — CR DG CHEST 2V
2 series · 2 of 2 positions shown · non-contrast
Comparison: Multiple priors.

CLINICAL DATA: Patient swallowed   foreign objects.

EXAM:
CHEST  2 VIEW

[chest pa]
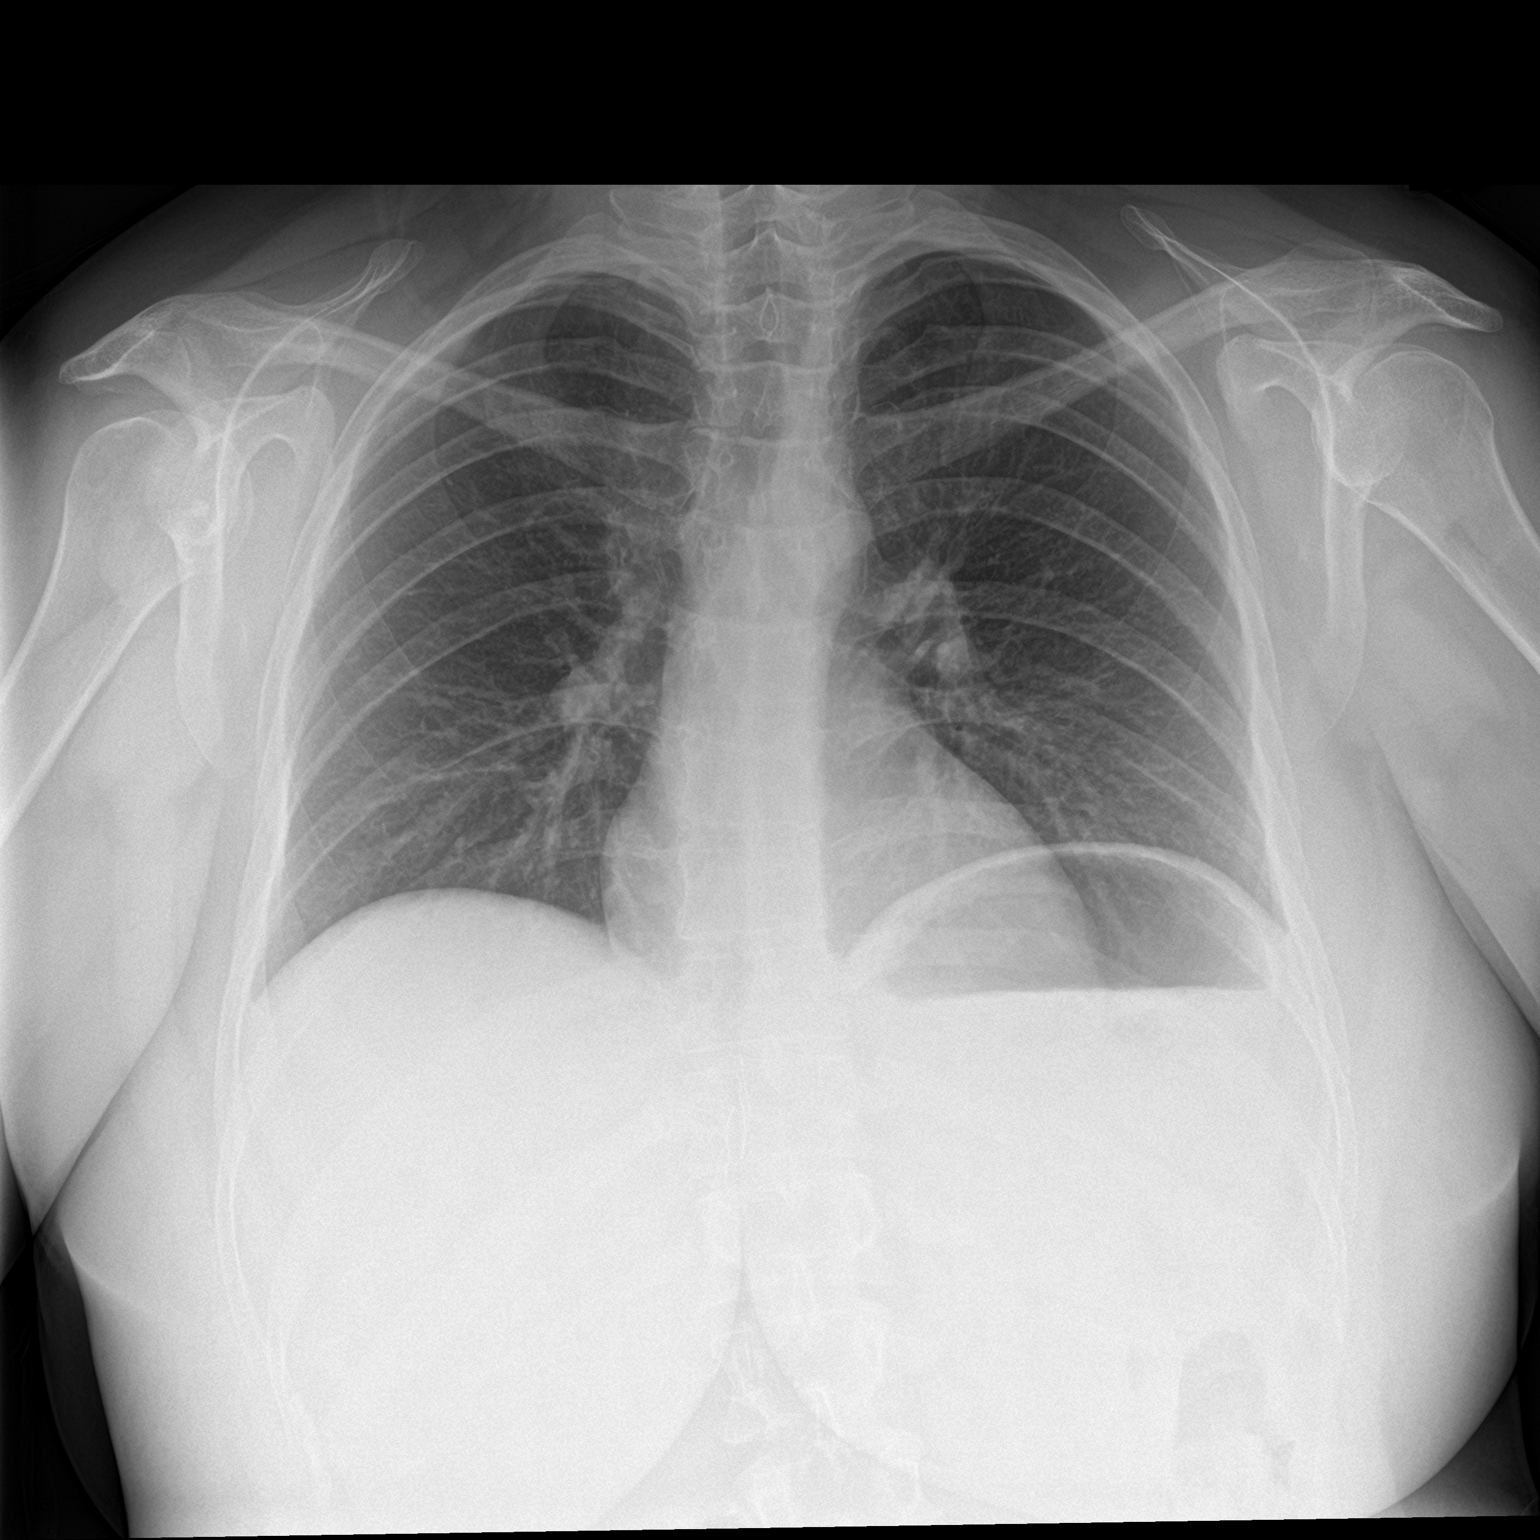

[chest lat]
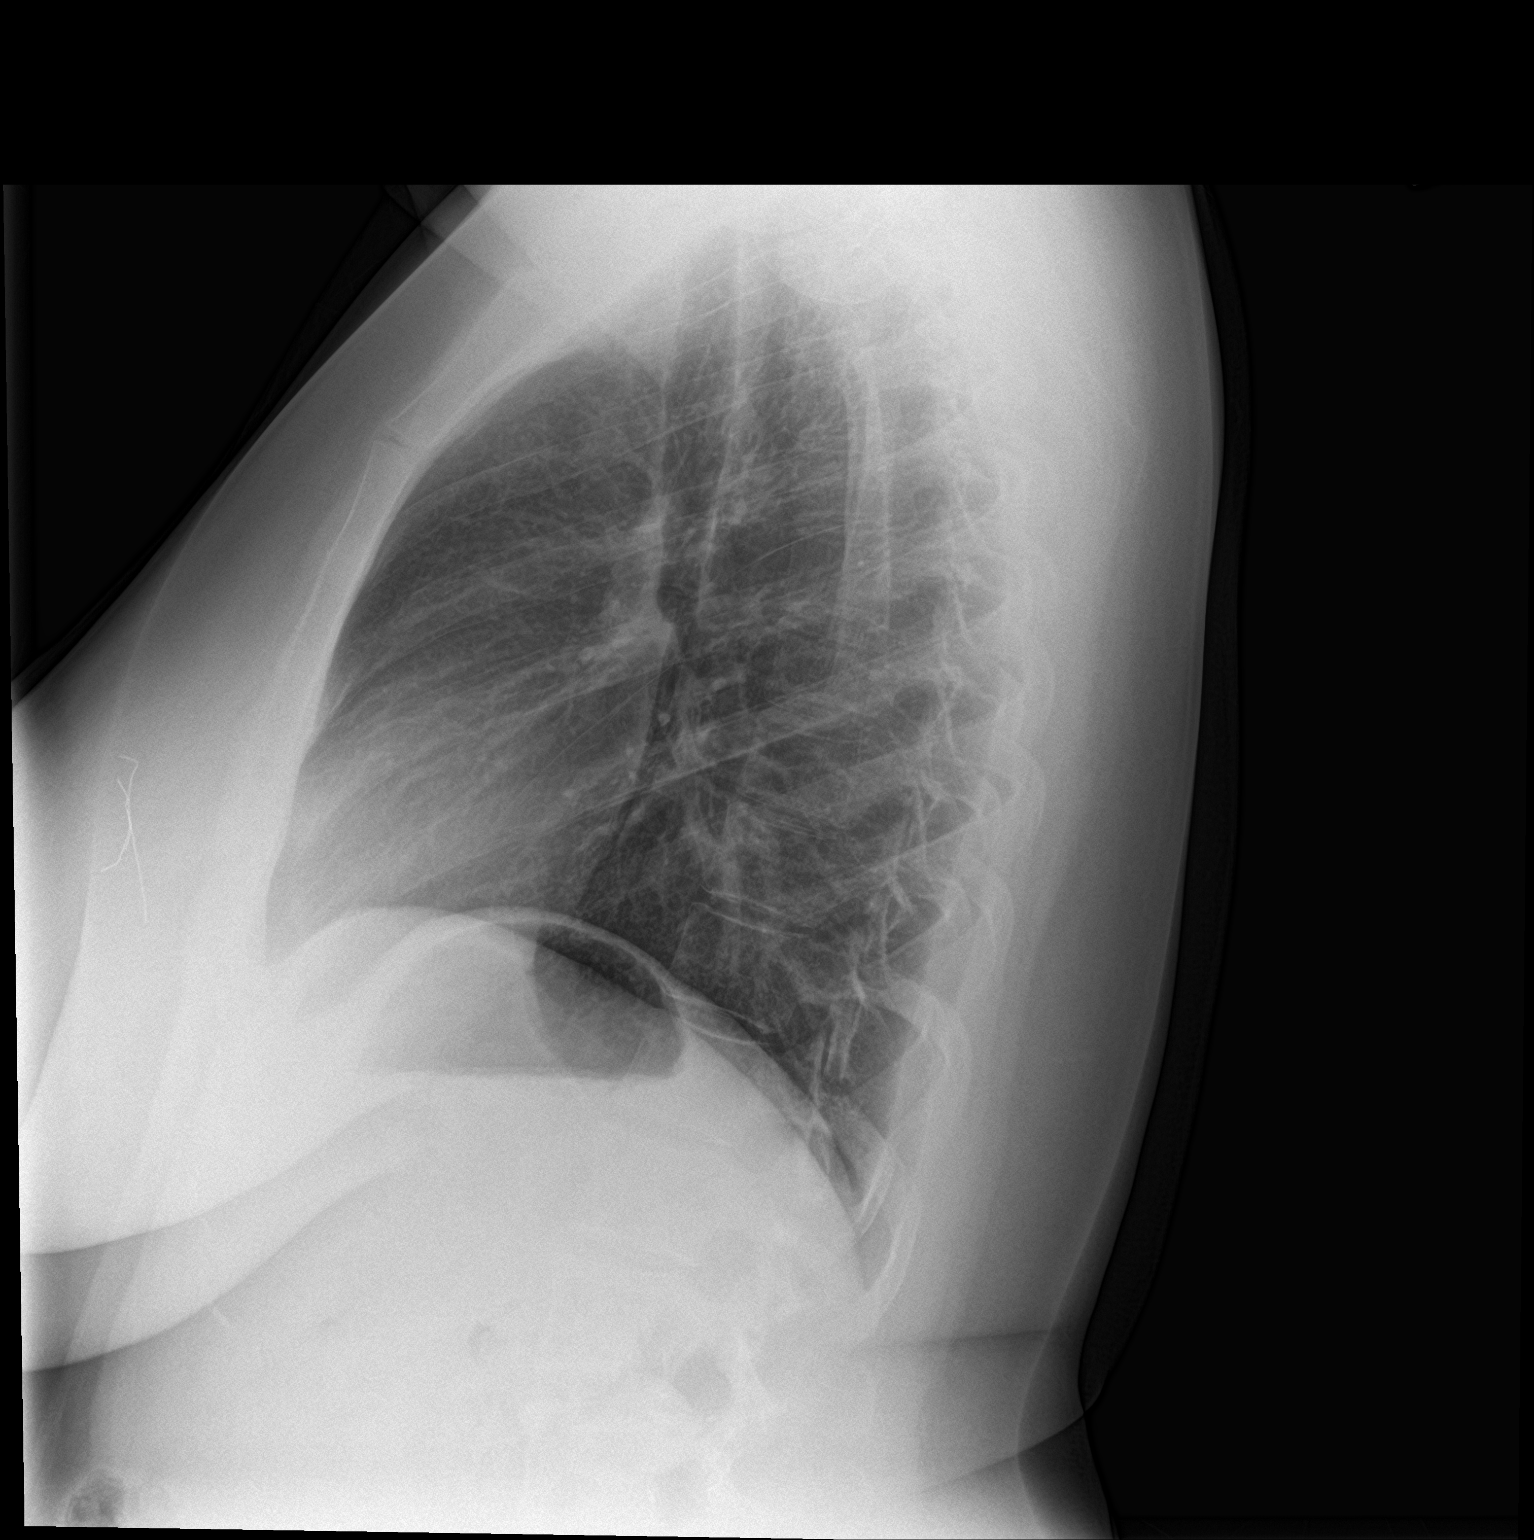

[2 of 2 positions shown; findings below may reference images not displayed]

FINDINGS: The heart size and mediastinal contours are within normal limits.
Both lungs are clear. The visualized skeletal structures are
unremarkable.

Within the soft tissues of the anterior chest, some wire structures
are imbedded in the soft tissues.
IMPRESSION: No active cardiopulmonary disease.  No foreign body is seen.

## 2016-05-10 IMAGING — CR DG ABDOMEN 1V
2 series · 2 of 2 positions shown · non-contrast
Comparison: [DATE].

CLINICAL DATA: Patient swallowed foreign objects including thumb
tack.

EXAM:
ABDOMEN - 1 VIEW

[abdomen kub (1 of 2)]
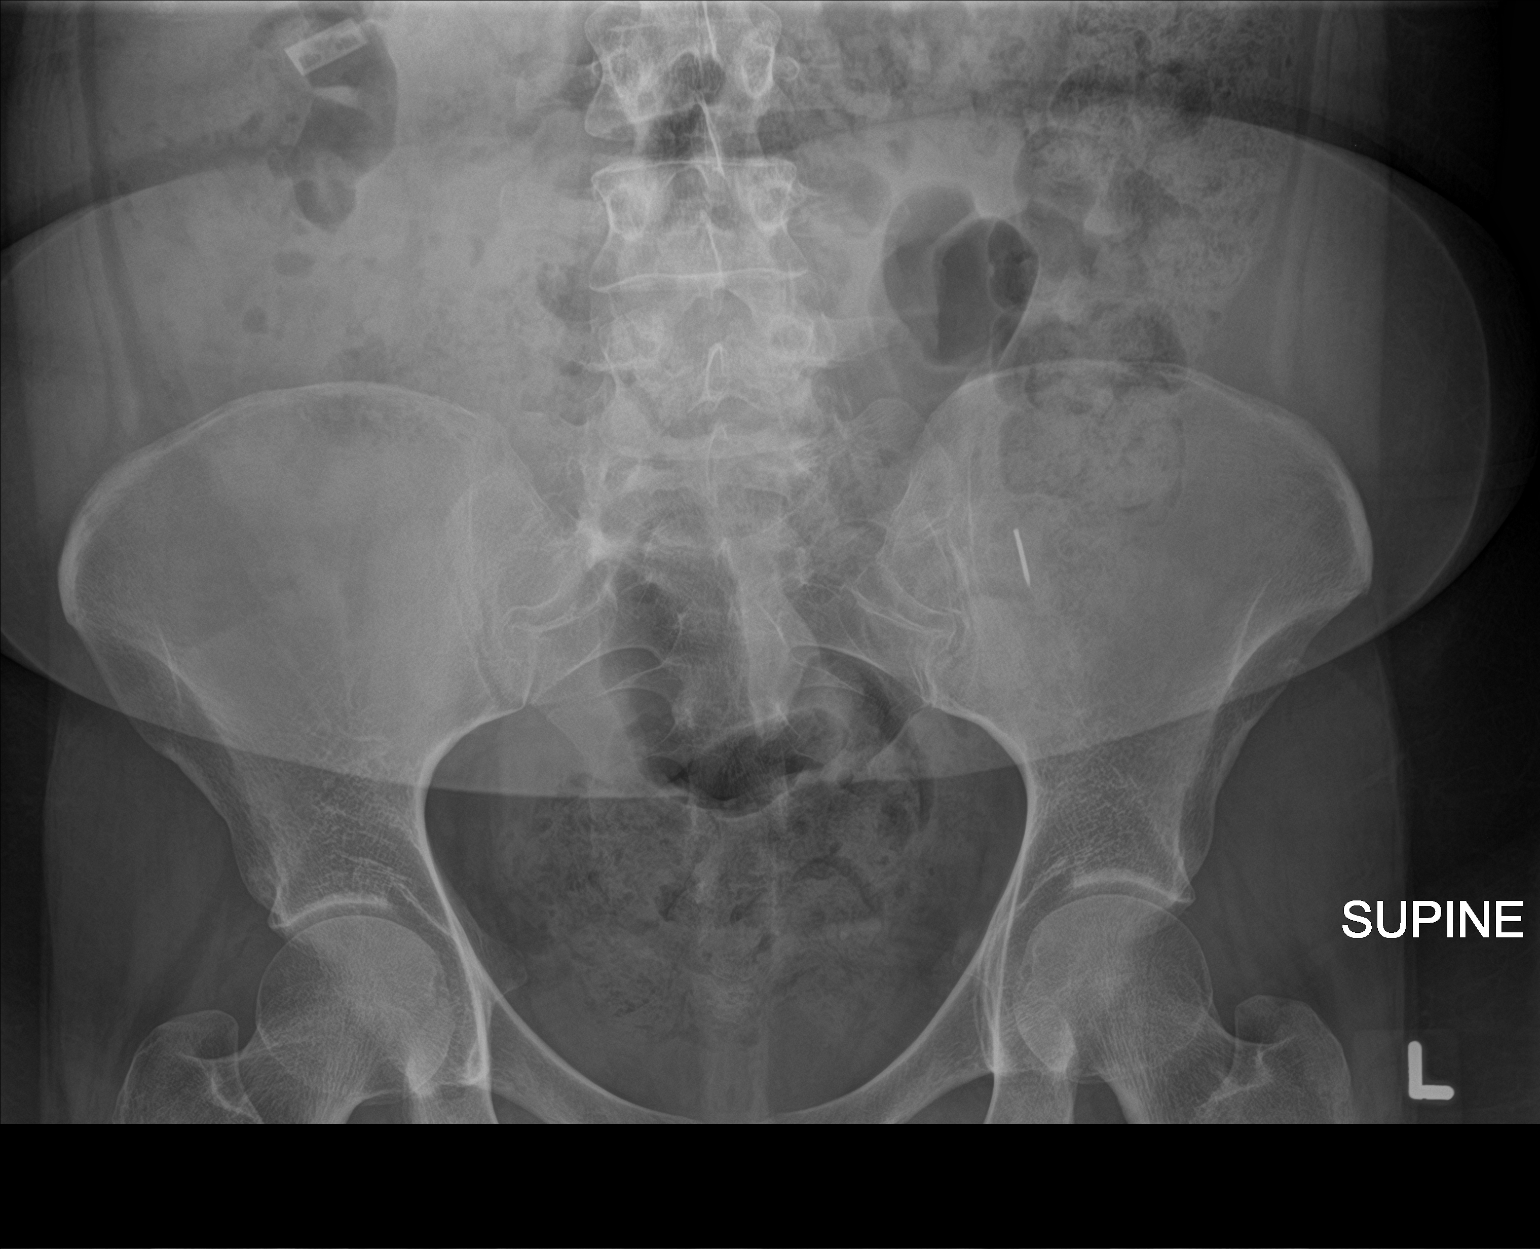

[abdomen kub (2 of 2)]
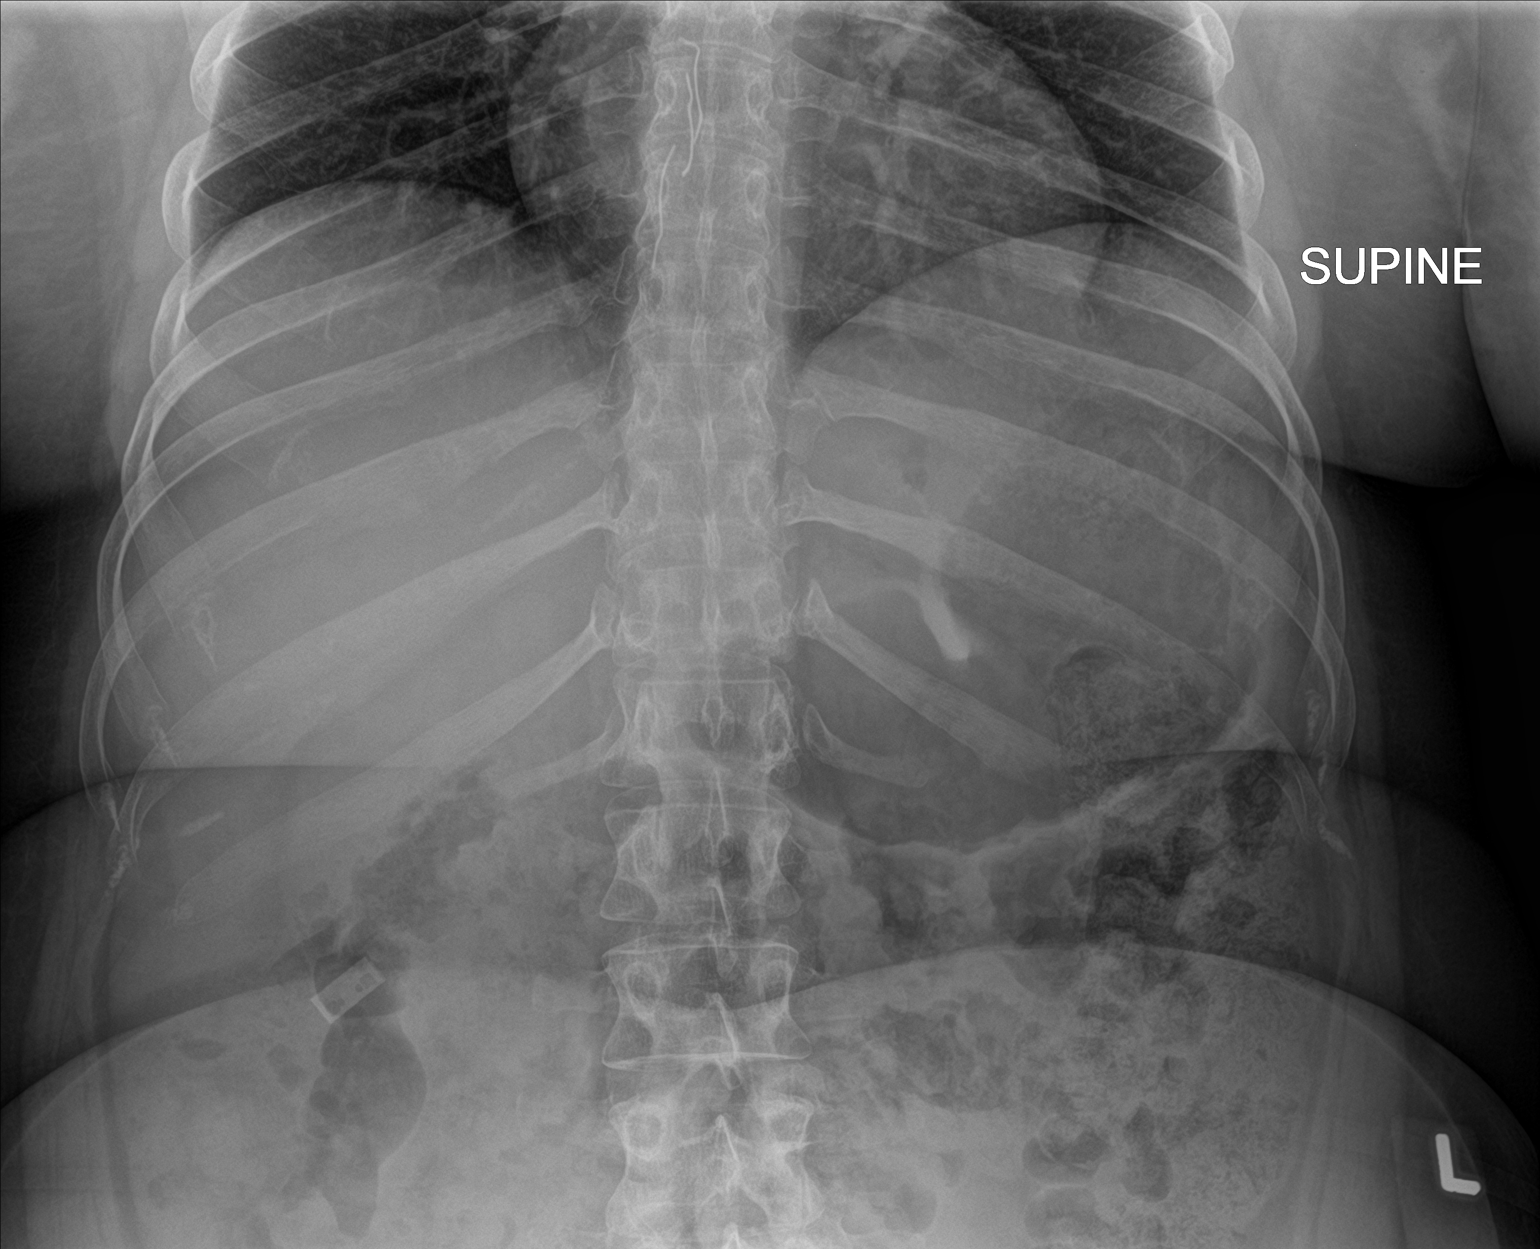

[2 of 2 positions shown; findings below may reference images not displayed]

FINDINGS: Moderate stool burden. There is a radiopaque density overlying the
LEFT iliac wing which may be within small bowel or distal colon.
This was not present on [DATE]. No visible free air. Negative osseous
structures.
IMPRESSION: Suspected thumb tack LEFT lower quadrant. This could lie within
small bowel or distal colon. Moderate stool burden.

## 2016-05-10 IMAGING — CT CT ABD-PELV W/O CM
2 of 4 series · 16 of 46 positions shown, 18 images · non-contrast
Comparison: [DATE] radiographs of the abdomen and pelvis
performed at [B6] hour.

CLINICAL DATA: Stomach ache after swallowing a razor blade and
thumb tack yesterday

EXAM:
CT ABDOMEN AND PELVIS WITHOUT CONTRAST
TECHNIQUE: Multidetector CT imaging of the abdomen and pelvis was performed
following the standard protocol without IV contrast.

[Series 2: routine abd/pel wo · axial · 0.96mm/px · z∈[-311,+144]mm · 13 of 99 slices shown, 15 images]
[im 4/99  soft-tissue]
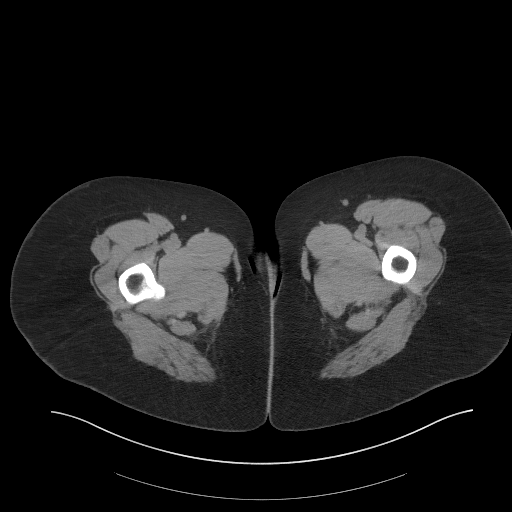
[im 4/99  bone]
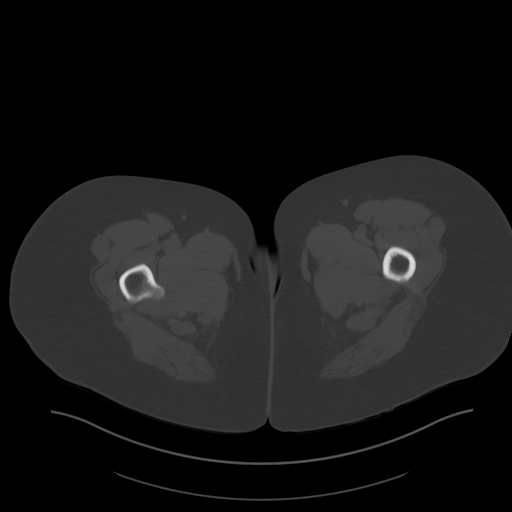
[im 12/99  soft-tissue]
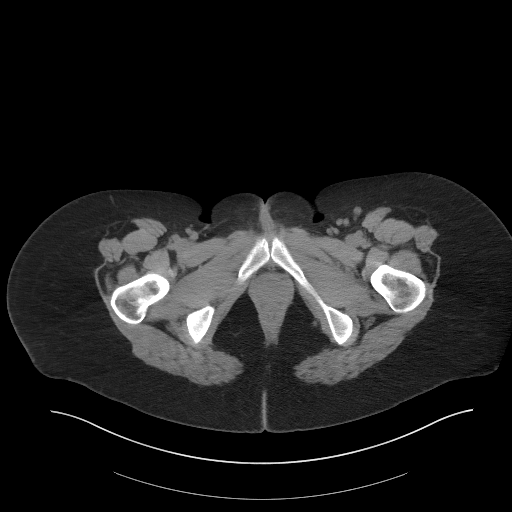
[im 20/99  soft-tissue]
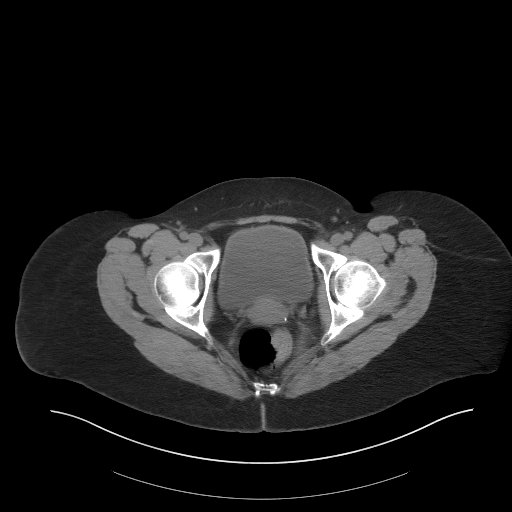
[im 28/99  soft-tissue]
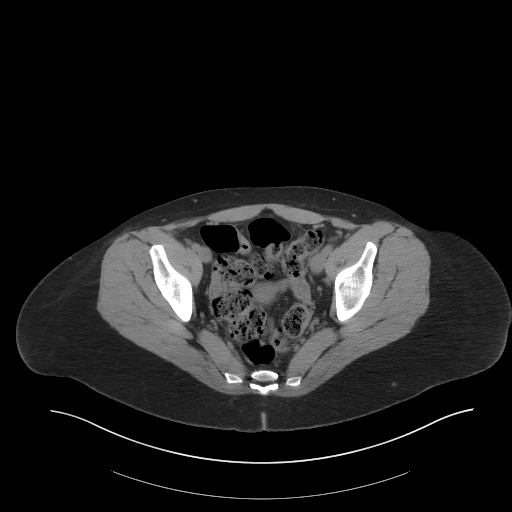
[im 36/99  soft-tissue]
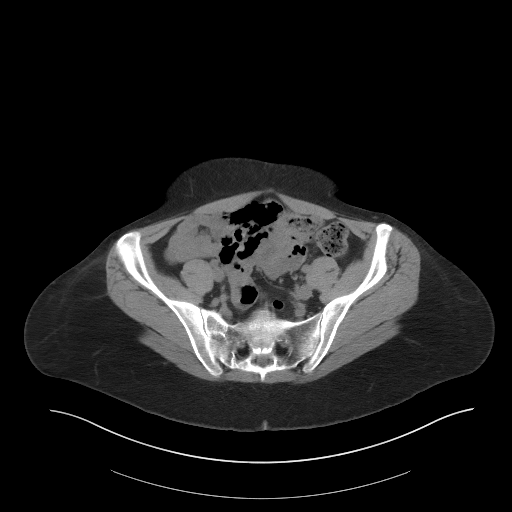
[im 44/99  soft-tissue]
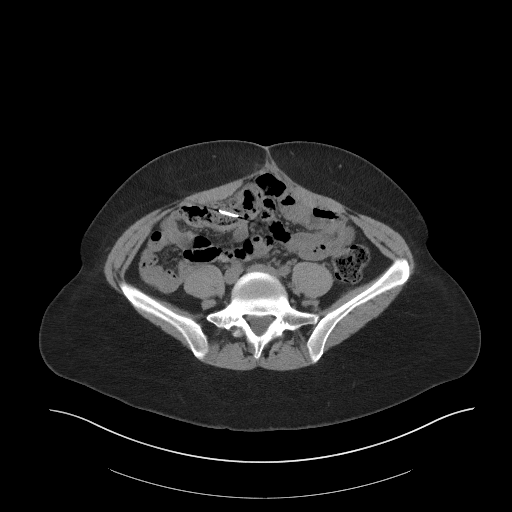
[im 51/99  soft-tissue]
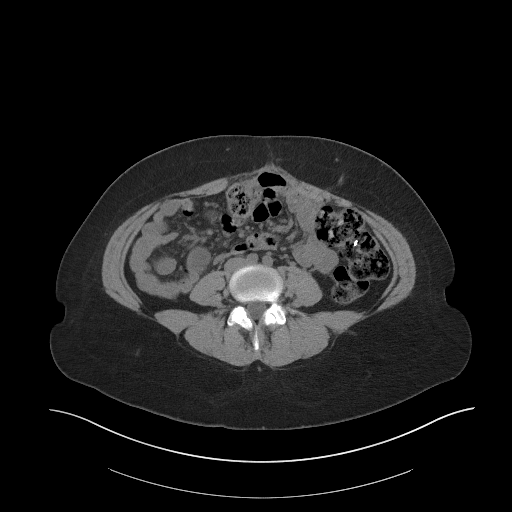
[im 55/99  soft-tissue]
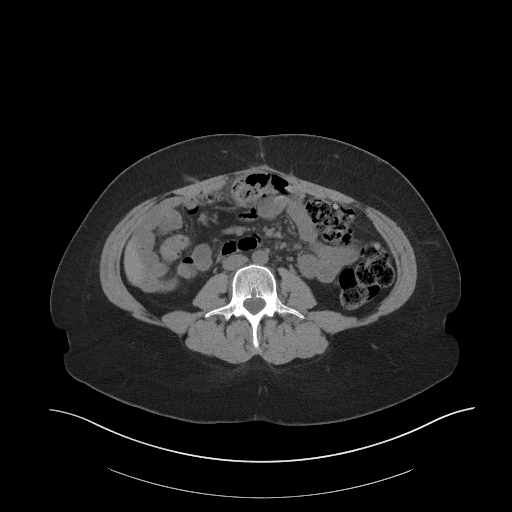
[im 63/99  soft-tissue]
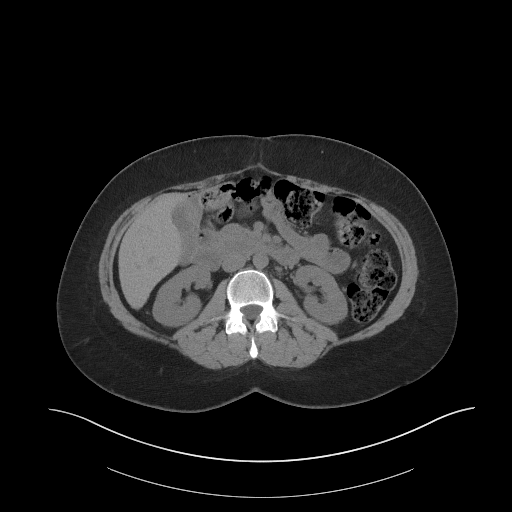
[im 63/99  bone]
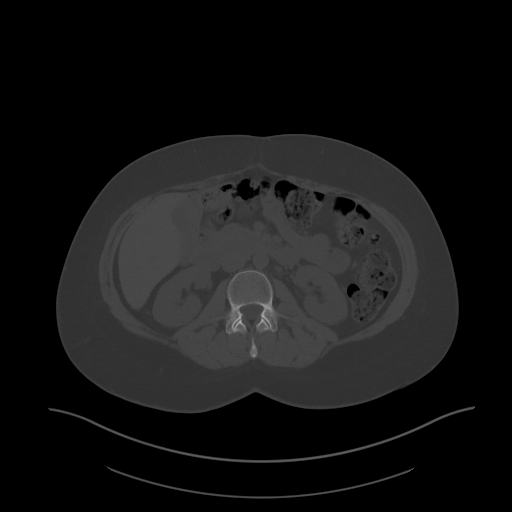
[im 71/99  soft-tissue]
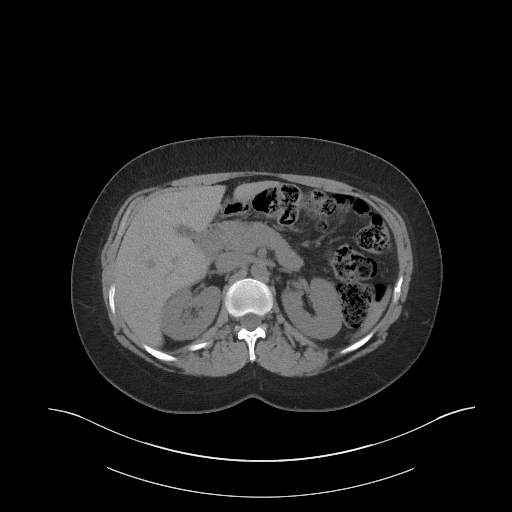
[im 79/99  soft-tissue]
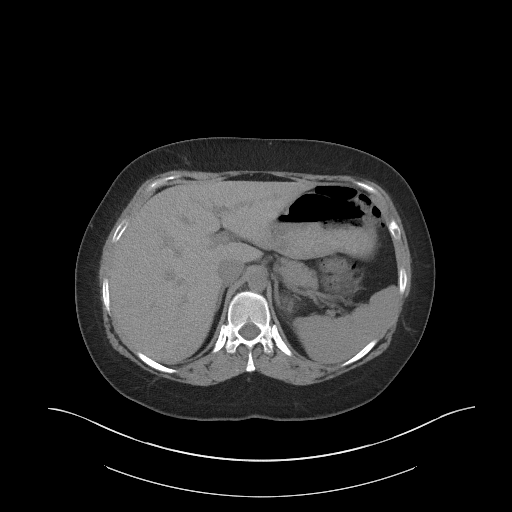
[im 87/99  soft-tissue]
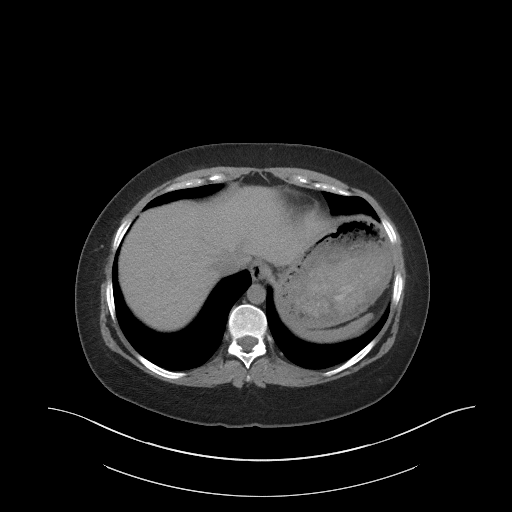
[im 95/99  soft-tissue]
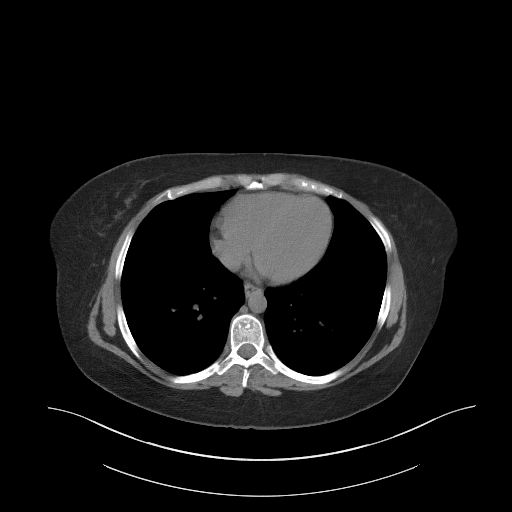

[Series 5: coronal st · coronal · 0.82mm/px · 3 of 87 slices shown]
[im 29/87  soft-tissue]
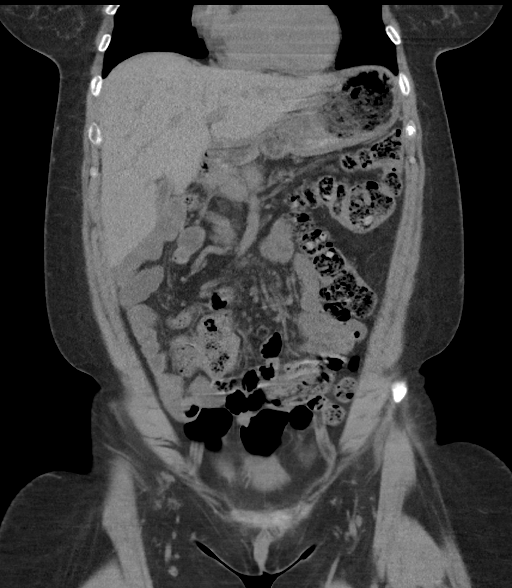
[im 39/87  soft-tissue]
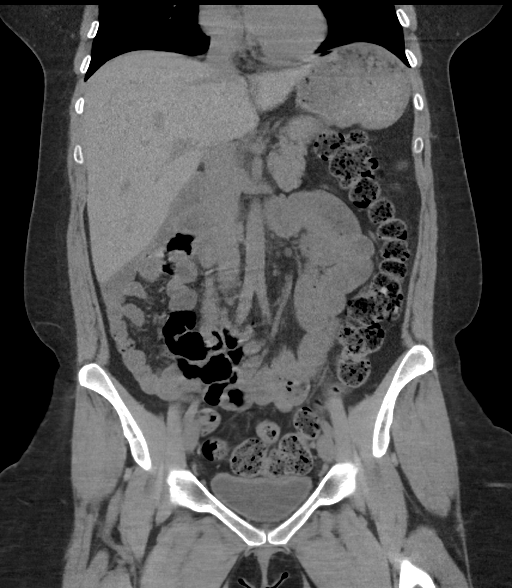
[im 48/87  soft-tissue]
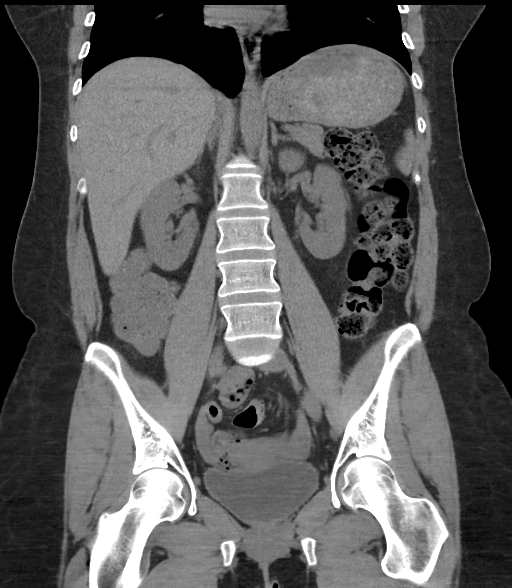

[16 of 46 positions shown; findings below may reference images not displayed]

FINDINGS: LOWER CHEST: Lung bases are clear. Included heart size is normal. No
pericardial effusion.

HEPATOBILIARY: No hepatic mass or biliary dilatation. Contracted
gallbladder. No gallstones.

PANCREAS: Normal.

SPLEEN: Normal.

ADRENALS/URINARY TRACT: Kidneys are orthotopic. No nephrolithiasis,
hydronephrosis or solid renal masses. The unopacified ureters are
normal in course and caliber. Urinary bladder is partially distended
and unremarkable. Normal adrenal glands.

STOMACH/BOWEL: The stomach, small and large bowel are normal in
course and caliber without inflammatory changes. Within the cecum is
a 20 x 10 x 1 mm radiopaque foreign body consistent with a razor
blade. Within the mid descending colon is a more linear metallic
foreign body measuring up to 10 mm consistent with ingested tack. No
perforation of bowel. Moderate colonic stool burden is seen
throughout large intestine.

VASCULAR/LYMPHATIC: Aortoiliac vessels are normal in course and
caliber. No lymphadenopathy by CT size criteria.

REPRODUCTIVE: Normal.

OTHER: No intraperitoneal free fluid or free air.

MUSCULOSKELETAL: Nonacute.
IMPRESSION: Ingested foreign bodies consistent with a razor blade in the cecum
and tack in the mid descending colon. No bowel perforation or free
fluid noted.

Increased stool burden throughout large bowel.

## 2016-05-10 NOTE — ED Triage Notes (Signed)
Pt arrives to ED via [facility] d/t reports of swallowing a razor blade. Pt h/x significant for same. Pt arrives A&Ox4, in NAD, with respirations even, regular, and unlabored.

## 2016-05-10 NOTE — ED Provider Notes (Signed)
[facility] Emergency Department Provider Note   ____________________________________________   First MD Initiated Contact with Patient 05/10/16 2009     (approximate)  I have reviewed the triage vital signs and the nursing notes.   HISTORY  Chief Complaint Swallowed Foreign Body    HPI Melanie Bell is a 25 y.o. female Asian says she swallowed a razor blade in a thumb tack. X-rays of the belly show what appears to be thumb tack in the left lower quadrant of the abdomen. Patient does have some right lower quadrant tenderness on exam. She is not vomiting has no fever or feels otherwise okay. She says she was having some PTSD when she swallowed the objects and was suicidal and is not now. I spoke with GI at [facility] since we do not have GI here today she recommended watching the patient making sure the thumb tack moves with a plain film tomorrow. However since she has right lower quadrant tenderness on exam I will go ahead and CT her and then probably repeat the plain film tomorrow.   Past Medical History:  Diagnosis Date  . Anxiety   . Asthma   . Depression   . GERD (gastroesophageal reflux disease)   . Hallucinations 09/30/2014   Sizoaffective  . Hyperlipidemia   . Hypertension   . Intentional ingestion of batteries 04/21/2016    2 AAA batteries and 1 thumb tack; intent to hurt herself/notes 04/21/2016  . Tardive dyskinesia 10/2014   recent onset    Patient Active Problem List   Diagnosis Date Noted  . Malingering 05/06/2016  . Foreign body aspiration   . FB GI (foreign body in gastrointestinal tract)   . Swallowed foreign body 04/23/2016  . Foreign body ingestion 04/21/2016  . Gastric foreign body   . Breast tumor 03/05/2016  . Schizoaffective disorder, bipolar type ([place]) 11/06/2014  . Tobacco use disorder 09/30/2014  . Hypothyroidism 09/29/2014  . Hypertension 09/29/2014    Past Surgical History:  Procedure Laterality Date  .  ABDOMINAL SURGERY     "years ago" to remove foreign objects  . APPENDECTOMY    . BREAST LUMPECTOMY Right   . COLONOSCOPY WITH PROPOFOL N/A 09/10/2015   Procedure: COLONOSCOPY WITH PROPOFOL;  Surgeon: [name], MD;  Location: [facility] ENDOSCOPY;  Service: Endoscopy;  Laterality: N/A;  . ESOPHAGOGASTRODUODENOSCOPY N/A 11/28/2014   Procedure: ESOPHAGOGASTRODUODENOSCOPY (EGD);  Surgeon: [name], MD;  Location[facility] ENDOSCOPY;  Service: Endoscopy;  Laterality: N/A;  . ESOPHAGOGASTRODUODENOSCOPY N/A 02/21/2016   Procedure: ESOPHAGOGASTRODUODENOSCOPY (EGD);  Surgeon: Mauri Pole, MD;  Location[facility] ENDOSCOPY;  Service: Endoscopy;  Laterality: N/A;  . ESOPHAGOGASTRODUODENOSCOPY N/A 04/21/2016   Procedure: ESOPHAGOGASTRODUODENOSCOPY (EGD);  Surgeon: Gatha Mayer, MD;  Location: [facility] ENDOSCOPY;  Service: Endoscopy;  Laterality: N/A;  . ESOPHAGOGASTRODUODENOSCOPY (EGD) WITH PROPOFOL N/A 02/29/2016   Procedure: ESOPHAGOGASTRODUODENOSCOPY (EGD) WITH PROPOFOL;  Surgeon: [name], MD;  Location: [facility] ENDOSCOPY;  Service: Endoscopy;  Laterality: N/A;  . LAPAROTOMY N/A 09/12/2015   Procedure: EXPLORATORY LAPAROTOMY;  Surgeon: Florene Glen, MD;  Location: [facility] ORS;  Service: General;  Laterality: N/A;  . SIGMOIDOSCOPY N/A 09/12/2015   Procedure: Lonell Face;  Surgeon: Florene Glen, MD;  Location: [facility] ORS;  Service: General;  Laterality: N/A;  . WISDOM TOOTH EXTRACTION      Prior to Admission medications   Medication Sig Start Date End Date Taking? Authorizing Provider  amantadine (SYMMETREL) 100 MG capsule Take 1 capsule (100 mg total) by mouth 2 (  two) times daily. 03/07/16  Yes [name], MD  amLODipine (NORVASC) 2.5 MG tablet Take 1 tablet (2.5 mg total) by mouth daily. 06/19/15  Yes [name] [name], MD  fluPHENAZine (PROLIXIN) 5 MG tablet Take 5-10 mg by mouth See admin instructions. Take 5 mg by mouth in the morning, take 5 mg by mouth in the afternoon and  take 10 mg by mouth at bedtime.   Yes Historical Provider, MD  levothyroxine (SYNTHROID, LEVOTHROID) 75 MCG tablet Take 1 tablet (75 mcg total) by mouth daily before breakfast. 06/19/15  Yes [name] [name], MD  Lurasidone HCl (LATUDA) 120 MG TABS Take 120 mg by mouth every evening.   Yes Historical Provider, MD  OXcarbazepine (TRILEPTAL) 150 MG tablet Take 1 tablet (150 mg total) by mouth 2 (two) times daily. 06/19/15  Yes [name] [name], MD  traZODone (DESYREL) 50 MG tablet Take 50 mg by mouth at bedtime.   Yes Historical Provider, MD  venlafaxine XR (EFFEXOR-XR) 150 MG 24 hr capsule Take 1 capsule (150 mg total) by mouth daily with breakfast. 06/19/15  Yes [name] [name], MD    Allergies Betadine [povidone iodine]; Iodine; and Shellfish-derived products  Family History  Problem Relation Age of Onset  . Depression Mother   . Hypertension Mother   . Sleep apnea Mother   . Asthma Mother   . COPD Mother   . Diabetes Mother     Social History Social History  Substance Use Topics  . Smoking status: Current Every Day Smoker    Packs/day: 0.50    Years: 3.00    Types: Cigarettes  . Smokeless tobacco: Never Used  . Alcohol use No    Review of Systems Constitutional: No fever/chills Eyes: No visual changes. ENT: No sore throat. Cardiovascular: Denies chest pain. Respiratory: Denies shortness of breath. Gastrointestinal: No abdominal pain.  No nausea, no vomiting.  No diarrhea.  No constipation. Genitourinary: Negative for dysuria. Musculoskeletal: Negative for back pain. Skin: Negative for rash. Neurological: Negative for headaches, focal weakness or numbness.  10-point ROS otherwise negative.  ____________________________________________   PHYSICAL EXAM:  VITAL SIGNS: ED Triage Vitals [05/10/16 2000]  Enc Vitals Group     BP      Pulse      Resp      Temp      Temp src      SpO2      Weight 195 lb (88.5 kg)     Height 5\' 6"  (1.676 m)     Head  Circumference      Peak Flow      Pain Score 8     Pain Loc      Pain Edu?      Excl. in [place]?     Constitutional: Alert and oriented. Well appearing and in no acute distress. Eyes: Conjunctivae are normal. PERRL. EOMI. Head: Atraumatic. Nose: No congestion/rhinnorhea. Mouth/Throat: Mucous membranes are moist.  Oropharynx non-erythematous. Neck: No stridor.  Cardiovascular: Normal rate, regular rhythm. Grossly normal heart sounds.  Good peripheral circulation. Respiratory: Normal respiratory effort.  No retractions. Lungs CTAB. Gastrointestinal: Soft rq tenderness on exam No distention. No abdominal bruits. No CVA tenderness. Musculoskeletal: No lower extremity tenderness nor edema.  No joint effusions. Neurologic:  Normal speech and language. No gross focal neurologic deficits are appreciated. No gait instability. Skin:  Skin is warm, dry and intact. No rash noted. Psychiatric: Mood and affect are normal. Speech and behavior are normal.  ____________________________________________   LABS (all  labs ordered are listed, but only abnormal results are displayed)  Labs Reviewed  COMPREHENSIVE METABOLIC PANEL - Abnormal; Notable for the following:       Result Value   Glucose, Bld 108 (*)    Creatinine, Ser 1.19 (*)    AST 48 (*)    ALT 8 (*)    All other components within normal limits  CBC  ETHANOL  URINE DRUG SCREEN, QUALITATIVE ([facility] ONLY)  LIPASE, BLOOD  POC URINE PREG, ED  POCT PREGNANCY, URINE   ____________________________________________  EKG   ____________________________________________  RADIOLOGY  Study Result   CLINICAL DATA:  Patient swallowed foreign objects including thumb tack.  EXAM: ABDOMEN - 1 VIEW  COMPARISON:  05/06/2016.  FINDINGS: Moderate stool burden. There is a radiopaque density overlying the LEFT iliac wing which may be within small bowel or distal colon. This was not present on 12/22. No visible free air. Negative  osseous structures.  IMPRESSION: Suspected thumb tack LEFT lower quadrant. This could lie within small bowel or distal colon. Moderate stool burden.   Electronically Signed   By: Staci Righter M.D.   On: 05/10/2016 21:17     Study Result   CLINICAL DATA:  Patient swallowed   foreign objects.  EXAM: CHEST  2 VIEW  COMPARISON:  Multiple priors.  FINDINGS: The heart size and mediastinal contours are within normal limits. Both lungs are clear. The visualized skeletal structures are unremarkable.  Within the soft tissues of the anterior chest, some wire structures are imbedded in the soft tissues.  IMPRESSION: No active cardiopulmonary disease.  No foreign body is seen.   Electronically Signed   By: Staci Righter M.D.   On: 05/10/2016 21:20   Study Result   CLINICAL DATA:  Patient states swallowed thumb tack and razor blade.  EXAM: NECK SOFT TISSUES - 1+ VIEW  COMPARISON:  11/16/2015.  FINDINGS: There is no evidence of retropharyngeal soft tissue swelling or epiglottic enlargement. The cervical airway is unremarkable and no radio-opaque foreign body identified.  IMPRESSION: Negative.   Electronically Signed   By: Staci Righter M.D.   On: 05/10/2016 21:14    Study Result   CLINICAL DATA:  Stomach ache after swallowing a razor blade and thumb tack yesterday  EXAM: CT ABDOMEN AND PELVIS WITHOUT CONTRAST  TECHNIQUE: Multidetector CT imaging of the abdomen and pelvis was performed following the standard protocol without IV contrast.  COMPARISON:  05/10/2016 radiographs of the abdomen and pelvis performed at 2043 hour.  FINDINGS: LOWER CHEST: Lung bases are clear. Included heart size is normal. No pericardial effusion.  HEPATOBILIARY: No hepatic mass or biliary dilatation. Contracted gallbladder. No gallstones.  PANCREAS: Normal.  SPLEEN: Normal.  ADRENALS/URINARY TRACT: Kidneys are orthotopic. No  nephrolithiasis, hydronephrosis or solid renal masses. The unopacified ureters are normal in course and caliber. Urinary bladder is partially distended and unremarkable. Normal adrenal glands.  STOMACH/BOWEL: The stomach, small and large bowel are normal in course and caliber without inflammatory changes. Within the cecum is a 20 x 10 x 1 mm radiopaque foreign body consistent with a razor blade. Within the mid descending colon is a more linear metallic foreign body measuring up to 10 mm consistent with ingested tack. No perforation of bowel. Moderate colonic stool burden is seen throughout large intestine.  VASCULAR/LYMPHATIC: Aortoiliac vessels are normal in course and caliber. No lymphadenopathy by CT size criteria.  REPRODUCTIVE: Normal.  OTHER: No intraperitoneal free fluid or free air.  MUSCULOSKELETAL: Nonacute.  IMPRESSION: Ingested foreign  bodies consistent with a razor blade in the cecum and tack in the mid descending colon. No bowel perforation or free fluid noted.  Increased stool burden throughout large bowel.   Electronically Signed   By: Barrett Royalty M.D.   On: 05/10/2016 22:54     ____________________________________________   PROCEDURES  Procedure(s) performed:  Procedures  Critical Care performed:   ____________________________________________   INITIAL IMPRESSION / ASSESSMENT AND PLAN / ED COURSE  Pertinent labs & imaging results that were available during my care of the patient were reviewed by me and considered in my medical decision making (see chart for details).    Clinical Course     Discussed patient with GI. They recommend following the plain films to see if everything passes and possible colonoscopy or bowel prep if the material does not pass. ____________________________________________   FINAL CLINICAL IMPRESSION(S) / ED DIAGNOSES  Final diagnoses:  Swallowed foreign body, initial encounter      NEW  MEDICATIONS STARTED DURING THIS VISIT:  New Prescriptions   No medications on file     Note:  This document was prepared using Dragon voice recognition software and may include unintentional dictation errors.    Nena Polio, MD 05/10/16 [phone]

## 2016-05-10 NOTE — ED Notes (Signed)
Patient transported to CT 

## 2016-05-10 NOTE — BH Assessment (Signed)
Received TTS consult request for the pt. Spoke with Butch, RN who reports [facility] has coverage until 1am by "Renita". Was advised that TTS in [place] will need to begin covering [facility] consults after 01:00 on 05/11/16. Staffing notified.   Lind Covert, MSW, Latanya Presser
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
## 2016-05-11 ENCOUNTER — Encounter: Payer: Self-pay | Admitting: Gastroenterology

## 2016-05-11 ENCOUNTER — Emergency Department: Payer: Medicaid Other

## 2016-05-11 IMAGING — CR DG FOOT COMPLETE 3+V*L*
1 series · 3 of 3 positions shown · non-contrast
Comparison: [DATE]

CLINICAL DATA: Foreign body in left foot. Pain, itching, redness,
swelling.

EXAM:
LEFT FOOT - COMPLETE 3+ VIEW

[Series 19: x foot ap left · 0.14mm/px · 3 of 3 slices shown]
[im 1/3]
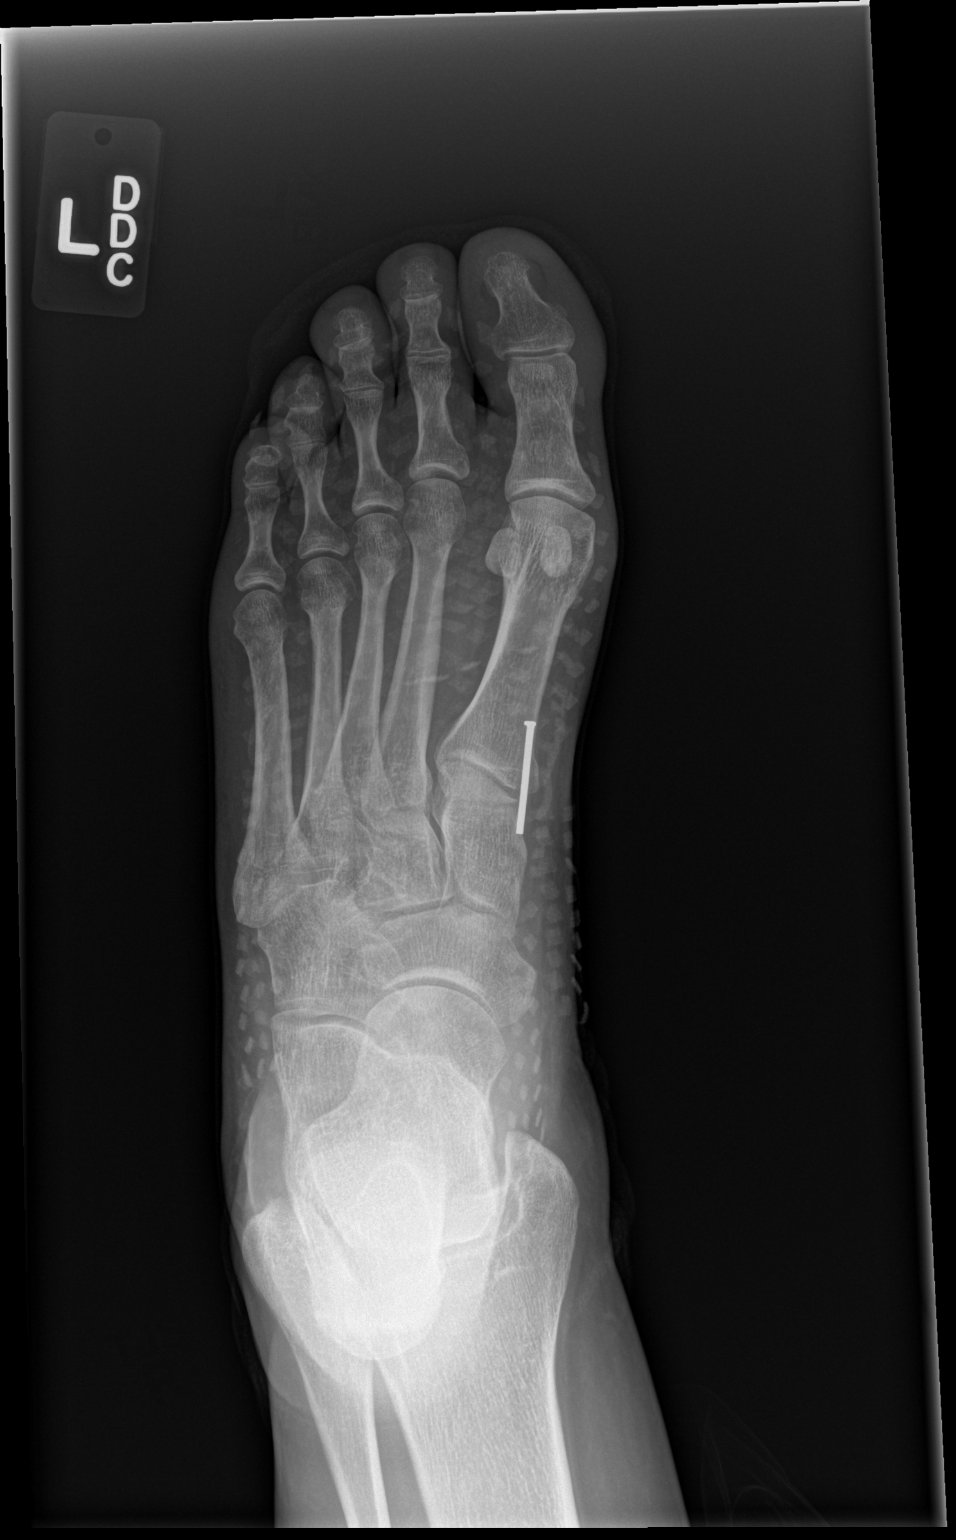
[im 2/3]
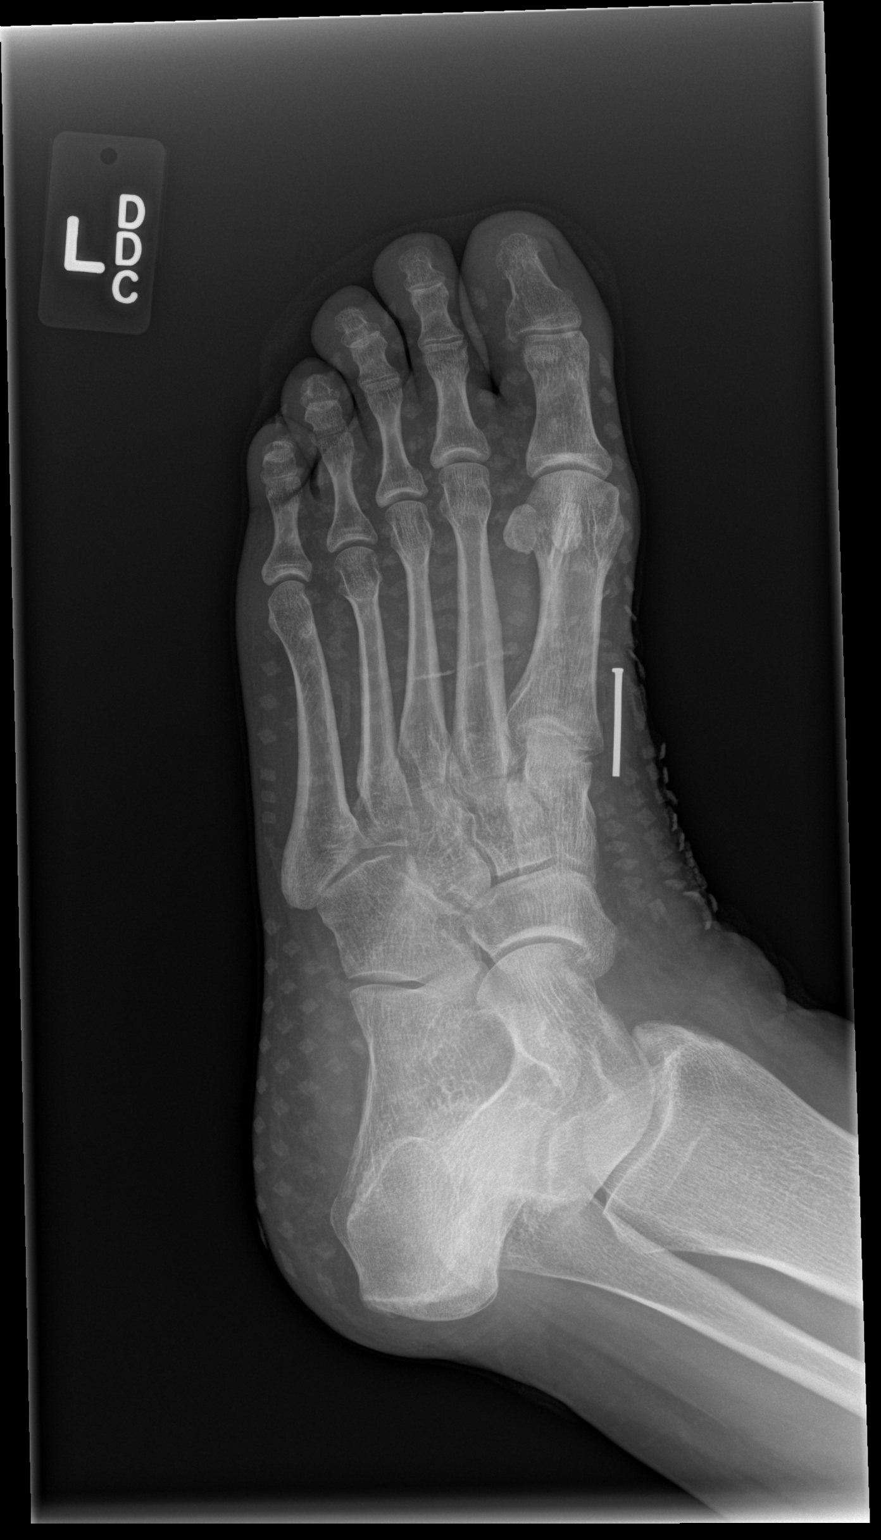
[im 3/3]
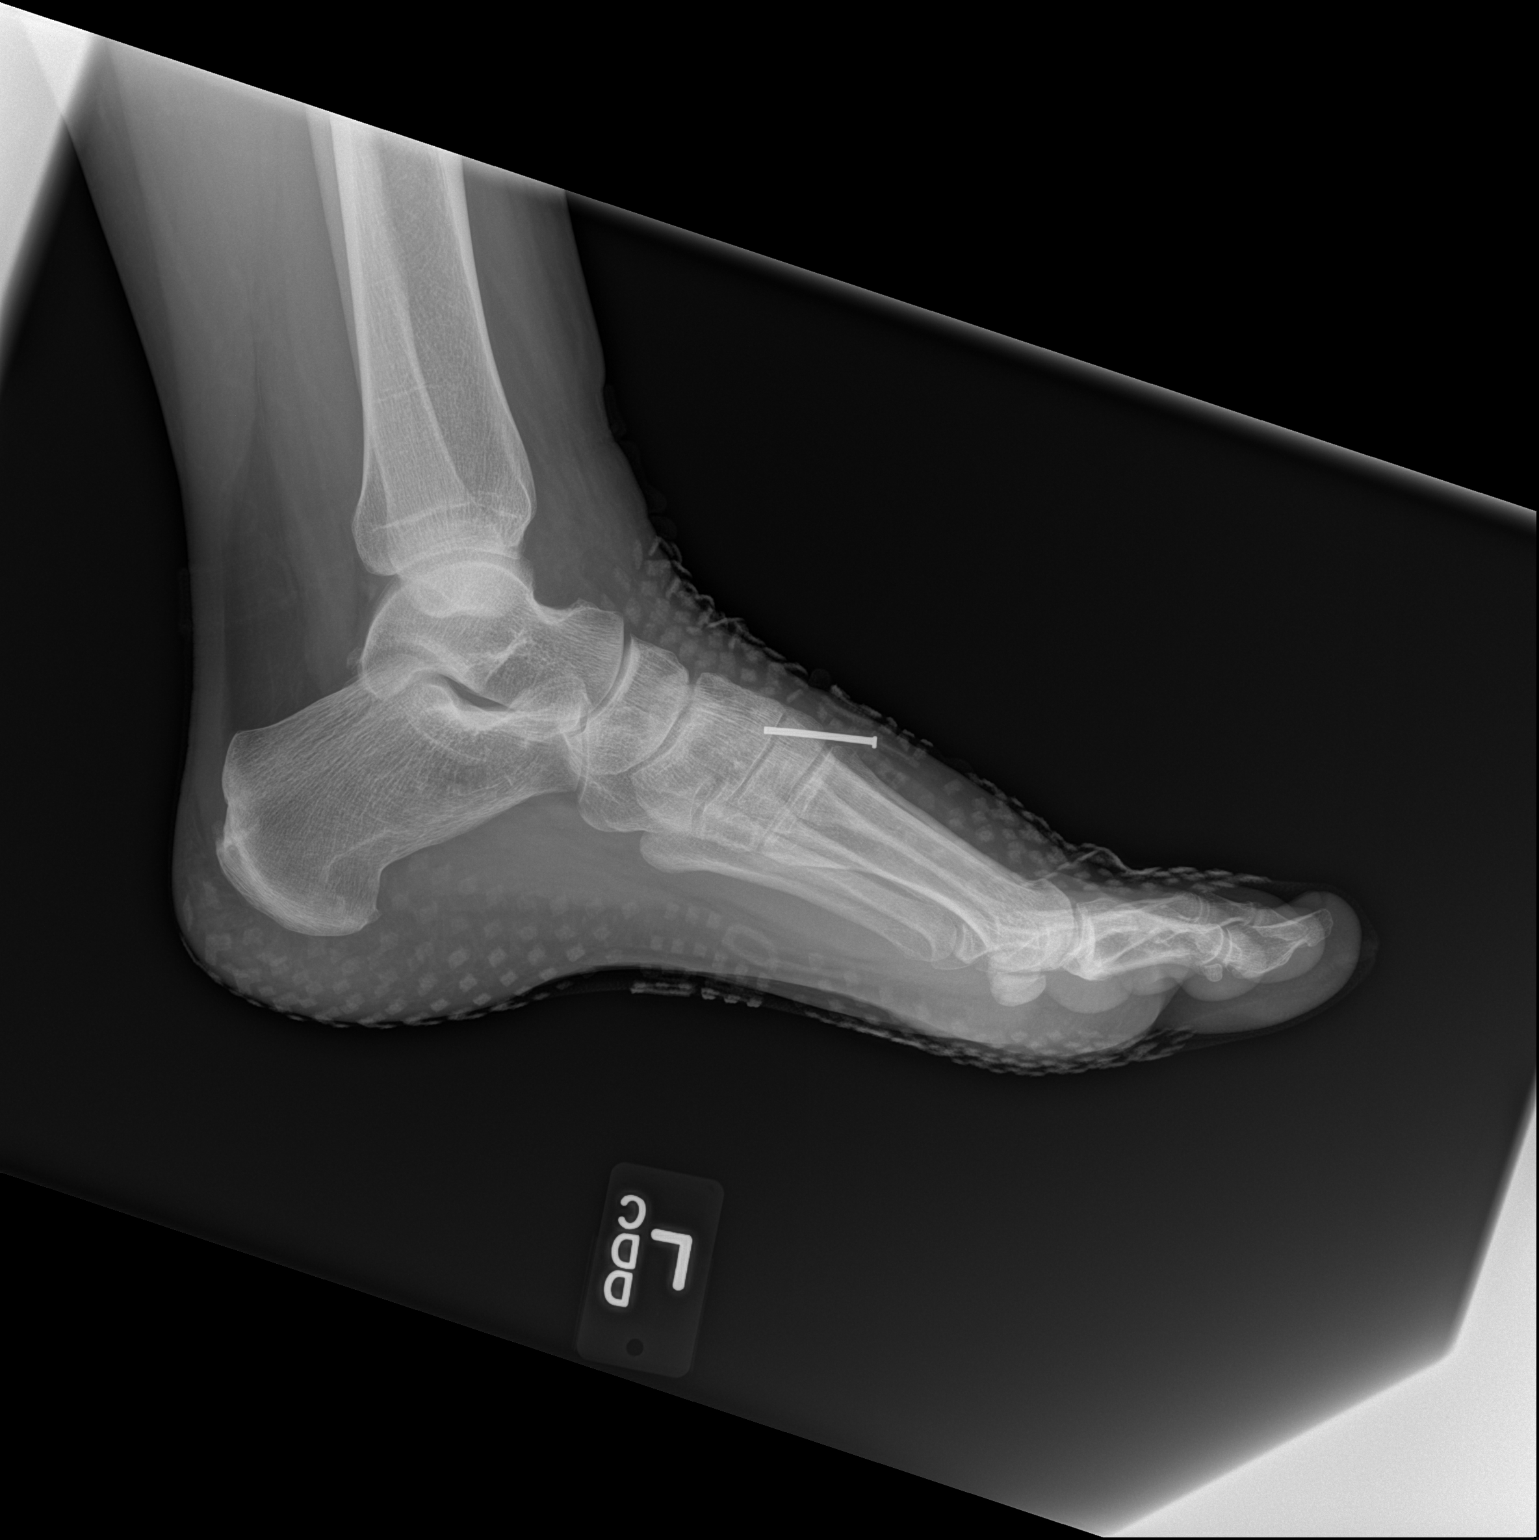

[3 of 3 positions shown; findings below may reference images not displayed]

FINDINGS: Screw shaped foreign body projects over the dorsal soft tissues of
the left foot overlying the first tarsal metatarsal region, in a
similar location to previously seen but different foreign body. No
underlying bony abnormality. No fracture, subluxation or
dislocation.
IMPRESSION: Screw shaped foreign body projects over the medial dorsal soft
tissues of the foot overlying the first MTP joint. No underlying
bony abnormality.

## 2016-05-11 IMAGING — CR DG ABDOMEN 1V
2 series · 2 of 2 positions shown · non-contrast
Comparison: [DATE] CT and abdomen radiographs

CLINICAL DATA: Ingested foreign body (thumb tack and razor blade

EXAM:
ABDOMEN - 1 VIEW

[abdomen kub (1 of 2)]
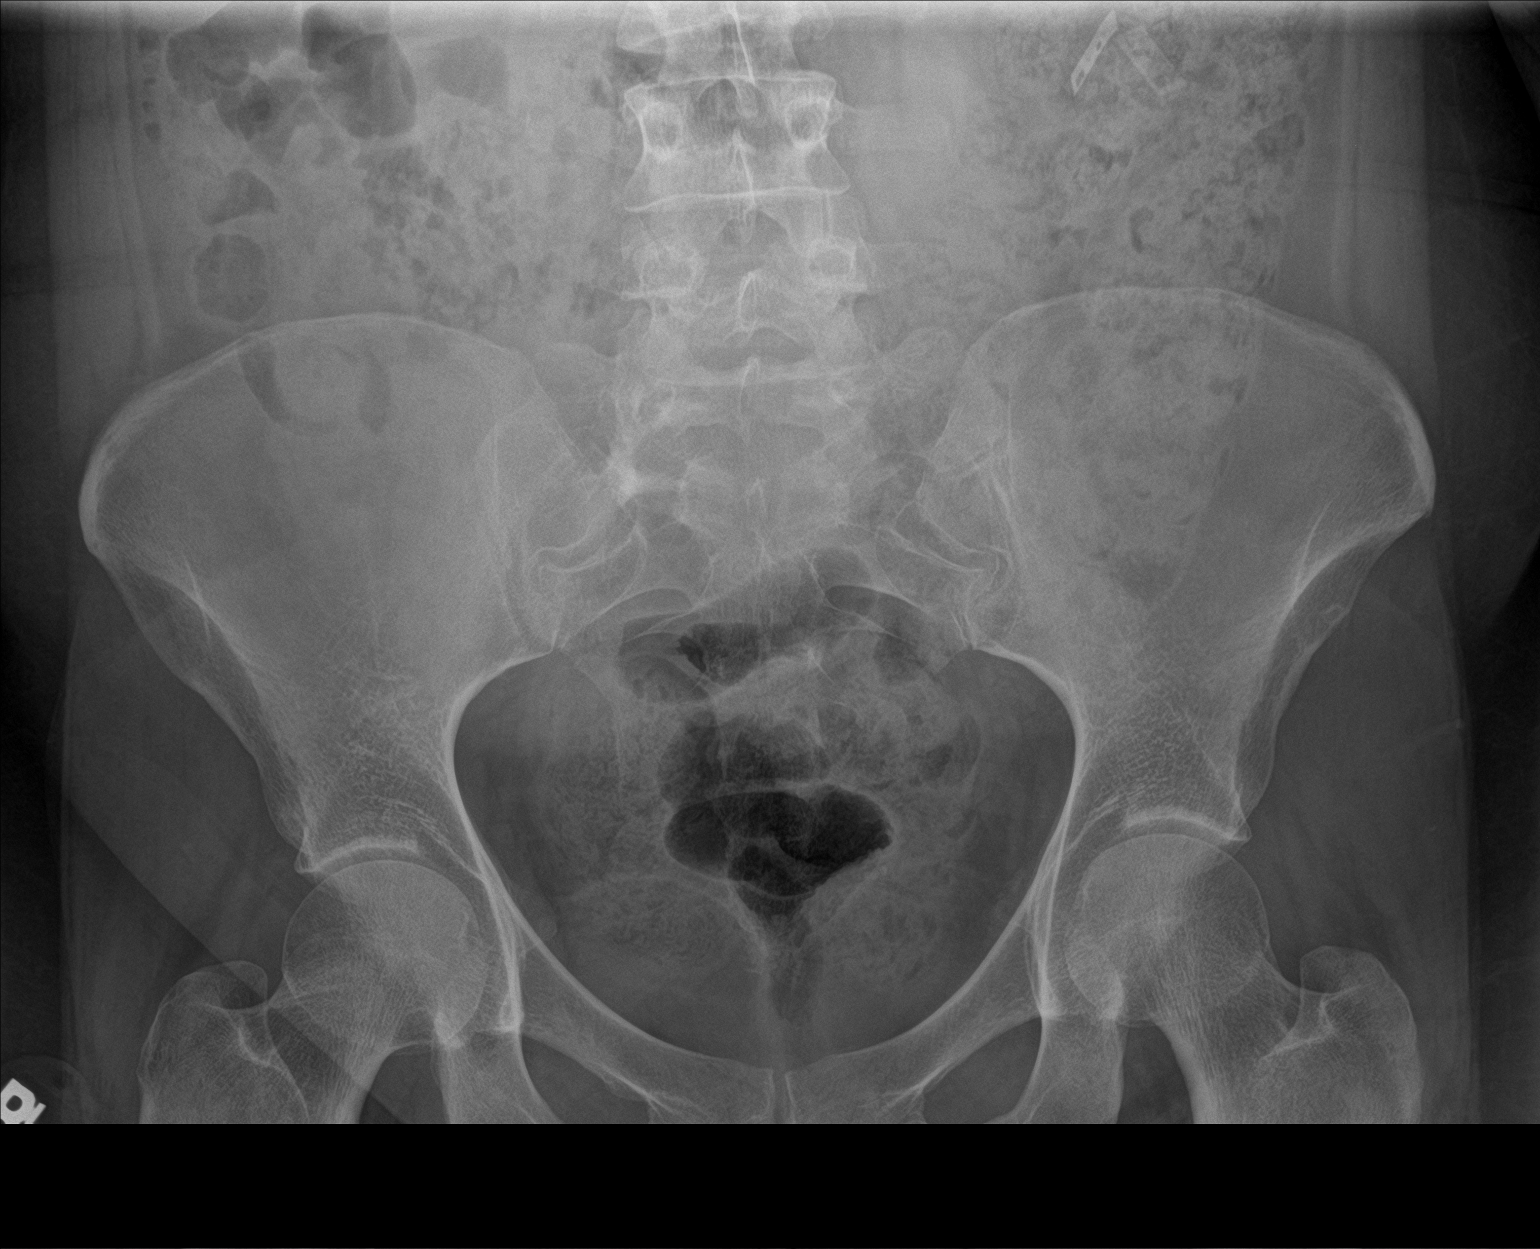

[abdomen kub (2 of 2)]
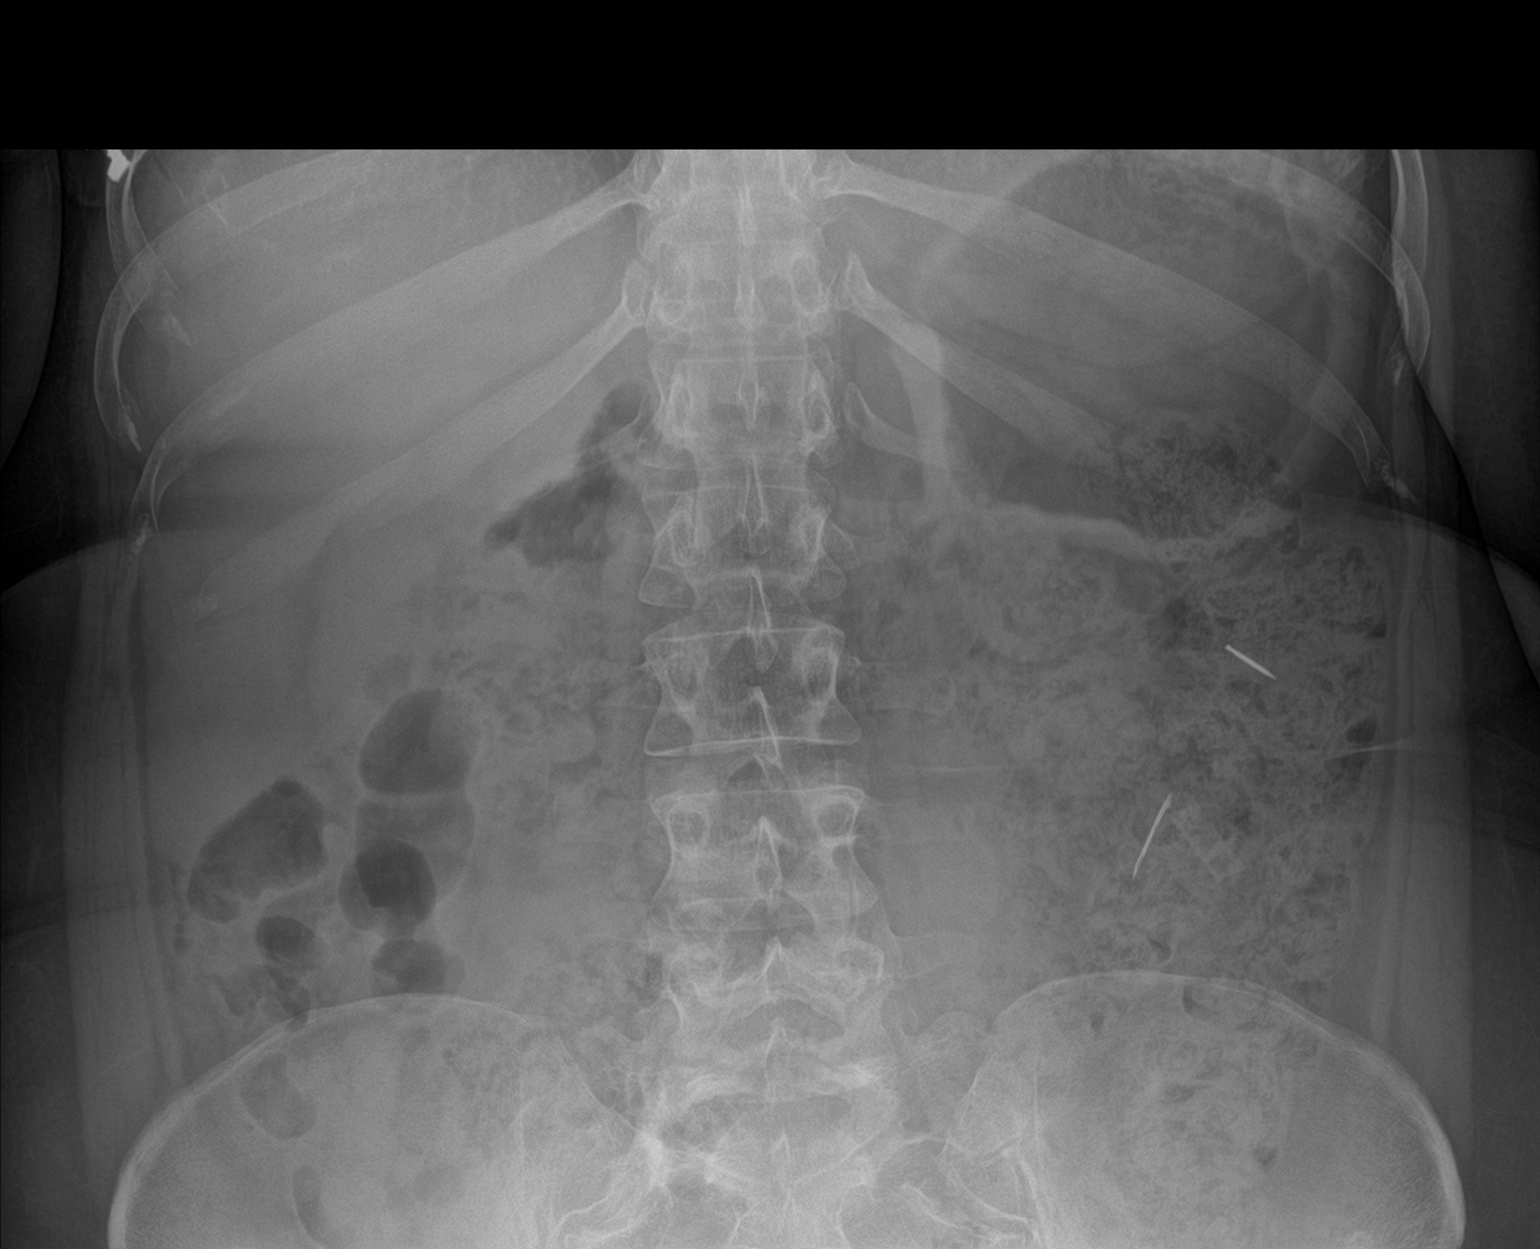

[2 of 2 positions shown; findings below may reference images not displayed]

FINDINGS: There is a significant amount of fecal residue within large bowel.
There does appear to have been some antegrade migration of the razor
blade like foreign body previously seen in the right hemiabdomen and
now situated in the left upper quadrant along the expected course of
the distal transverse colon. This foreign body however appears to
have separated into 2 separate components which are adjacent to each
other, one likely representing the blade and the second, the BAMBUCAFE
BAMBUCAFE. The tack like foreign body appears to project more
retrograde from previous location now at the splenic flexure. No
free air is identified. No bowel obstruction is seen.
IMPRESSION: Separation of the razor blade like foreign body into two components,
both having moved antegrade to the expected location of the distal
transverse colon.

Retrograde movement of the tack like foreign body to the splenic
flexure.

No free air noted.

## 2016-05-11 MED ORDER — HYDROCODONE-ACETAMINOPHEN 5-325 MG PO TABS
1.0000 | ORAL_TABLET | Freq: Once | ORAL | Status: AC
  Administered 2016-05-11: 1 via ORAL
  Filled 2016-05-11: qty 1

## 2016-05-11 NOTE — ED Notes (Signed)
Per BHU patient not appropriate to be moved. Pt is not inpatient psych.

## 2016-05-11 NOTE — ED Notes (Signed)
BEHAVIORAL HEALTH ROUNDING Patient sleeping: No. Patient alert and oriented: yes Behavior appropriate: Yes.  ; If no, describe:  Nutrition and fluids offered: yes Toileting and hygiene offered: Yes  Sitter present: q15 minute observations and security  monitoring Law enforcement present: Yes  ODS  
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
## 2016-05-11 NOTE — ED Provider Notes (Signed)
Study Result   CLINICAL DATA:  Ingested foreign body (thumb tack and razor blade  EXAM: ABDOMEN - 1 VIEW  COMPARISON:  05/10/2016 CT and abdomen radiographs  FINDINGS: There is a significant amount of fecal residue within large bowel. There does appear to have been some antegrade migration of the razor blade like foreign body previously seen in the right hemiabdomen and now situated in the left upper quadrant along the expected course of the distal transverse colon. This foreign body however appears to have separated into 2 separate components which are adjacent to each other, one likely representing the blade and the second, the blade holder. The tack like foreign body appears to project more retrograde from previous location now at the splenic flexure. No free air is identified. No bowel obstruction is seen.  IMPRESSION: Separation of the razor blade like foreign body into two components, both having moved antegrade to the expected location of the distal transverse colon.  Retrograde movement of the tack like foreign body to the splenic flexure.  No free air noted.   Electronically Signed   By: Clotine Royalty M.D.   On: 05/11/2016 20:38    Discussed patient with Dr. Magdalen Spatz and a gastroenterology on-call. He recommends [place] admitted to the hospital medically.   Nena Polio, MD 05/11/16 2101

## 2016-05-11 NOTE — ED Notes (Signed)

## 2016-05-11 NOTE — ED Notes (Signed)
ED BHU [place] Is the patient under IVC or is there intent for IVC: Yes.   Is the patient medically cleared: Yes.   Is there vacancy in the ED BHU: Yes.   Is the population mix appropriate for patient: Yes.   Is the patient awaiting placement in inpatient or outpatient setting: Yes.  Return to group home   Has the patient had a psychiatric consult: Yes.   Survey of unit performed for contraband, proper placement and condition of furniture, tampering with fixtures in bathroom, shower, and each patient room: Yes.  ; Findings:  APPEARANCE/BEHAVIOR Calm and cooperative NEURO ASSESSMENT Orientation: oriented x 4  Denies pain Hallucinations: No.None noted (Hallucinations) Speech: Normal Gait: normal RESPIRATORY ASSESSMENT Even  Unlabored respirations  CARDIOVASCULAR ASSESSMENT Pulses equal   regular rate  Skin warm and dry   GASTROINTESTINAL ASSESSMENT no GI complaint EXTREMITIES Full ROM  PLAN OF CARE Provide calm/safe environment. Vital signs assessed twice daily. ED BHU Assessment once each 12-hour shift. Collaborate with TTS daily or as condition indicates. Assure the ED provider has rounded once each shift. Provide and encourage hygiene. Provide redirection as needed. Assess for escalating behavior; address immediately and inform ED provider.  Assess family dynamic and appropriateness for visitation as needed: Yes.  ; If necessary, describe findings:  Educate the patient/family about BHU procedures/visitation: Yes.  ; If necessary, describe findings:

## 2016-05-11 NOTE — ED Notes (Signed)
BEHAVIORAL HEALTH ROUNDING Patient sleeping: Yes.   Patient alert and oriented: eyes closed  Appears to be asleep Behavior appropriate: Yes.  ; If no, describe:  Nutrition and fluids offered: Yes  Toileting and hygiene offered: sleeping Sitter present: q 15 minute observations and security monitoring Law enforcement present: yes  ODS 

## 2016-05-11 NOTE — ED Notes (Signed)
Patient observed lying in bed with eyes closed  Even, unlabored respirations observed   NAD pt appears to be sleeping  I will continue to monitor along with every 15 minute visual observations and ongoing security monitoring    

## 2016-05-11 NOTE — ED Notes (Signed)
Patient waiting placement

## 2016-05-11 NOTE — ED Provider Notes (Signed)
-----------------------------------------   6:52 AM on 05/11/2016 -----------------------------------------   Blood pressure 117/73, pulse 71, temperature 98.3 F (36.8 C), temperature source Oral, resp. rate 18, height 5\' 6"  (1.676 m), weight 195 lb (88.5 kg), SpO2 98 %.  The patient had no acute events since last update.  Calm and cooperative at this time.  She was evaluated by [facility] psychiatry who recommends discharge back to group home. I am uncomfortable with discharging the patient now as she has yet to pass the razor blade and thumb tack. While I do not feel she is an Administrator, arts danger to herself, given her repeat history of swallowing foreign objects, I suspect she would swallow another foreign body upon her return to the group home this morning. For this reason, a in-house psychiatry consult has been placed. From the GI/foreign body standpoint, the previous ED provider discussed case with [facility] who recommends following foreign bodies with serial x-rays. Patient did not have a bowel movement overnight. Nursing and tech staff are aware that patient needs to use a hat when she has a bowel movement and they should investigate for foreign body in her stool. Disposition is pending Psychiatry/Behavioral Medicine team recommendations. Care transferred to Dr. Archie Balboa.     Paulette Blanch, MD 05/11/16 [phone]
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
## 2016-05-12 ENCOUNTER — Observation Stay: Payer: Medicaid Other

## 2016-05-12 DIAGNOSIS — F250 Schizoaffective disorder, bipolar type: Secondary | ICD-10-CM

## 2016-05-12 DIAGNOSIS — T182XXD Foreign body in stomach, subsequent encounter: Secondary | ICD-10-CM

## 2016-05-12 DIAGNOSIS — T189XXD Foreign body of alimentary tract, part unspecified, subsequent encounter: Secondary | ICD-10-CM

## 2016-05-12 LAB — BASIC METABOLIC PANEL
ANION GAP: 6 (ref 5–15)
BUN: 12 mg/dL (ref 6–20)
CO2: 27 mmol/L (ref 22–32)
Calcium: 9.2 mg/dL (ref 8.9–10.3)
Chloride: 106 mmol/L (ref 101–111)
Creatinine, Ser: 0.72 mg/dL (ref 0.44–1.00)
GFR calc Af Amer: >60 mL/min (ref >60)
Glucose, Bld: 137 mg/dL — ABNORMAL HIGH (ref 65–99)
POTASSIUM: 3.6 mmol/L (ref 3.5–5.1)
Sodium: 139 mmol/L (ref 135–145)

## 2016-05-12 LAB — CBC
HEMATOCRIT: 39.6 % (ref 35.0–47.0)
HEMOGLOBIN: 13.2 g/dL (ref 12.0–16.0)
MCH: 30.5 pg (ref 26.0–34.0)
MCHC: 33.2 g/dL (ref 32.0–36.0)
MCV: 91.9 fL (ref 80.0–100.0)
Platelets: 230 10*3/uL (ref 150–440)
RBC: 4.31 MIL/uL (ref 3.80–5.20)
RDW: 13.9 % (ref 11.5–14.5)
WBC: 6.8 10*3/uL (ref 3.6–11.0)

## 2016-05-12 LAB — MRSA PCR SCREENING: MRSA by PCR: NEGATIVE

## 2016-05-12 IMAGING — DX DG ABDOMEN 1V
2 series · 2 of 2 positions shown · non-contrast
Comparison: Abdominal radiograph performed earlier today at [DATE]
a.m.

CLINICAL DATA: Question of gastric foreign body. Initial encounter.

EXAM:
ABDOMEN - 1 VIEW

[abdomen kub (1 of 2)]
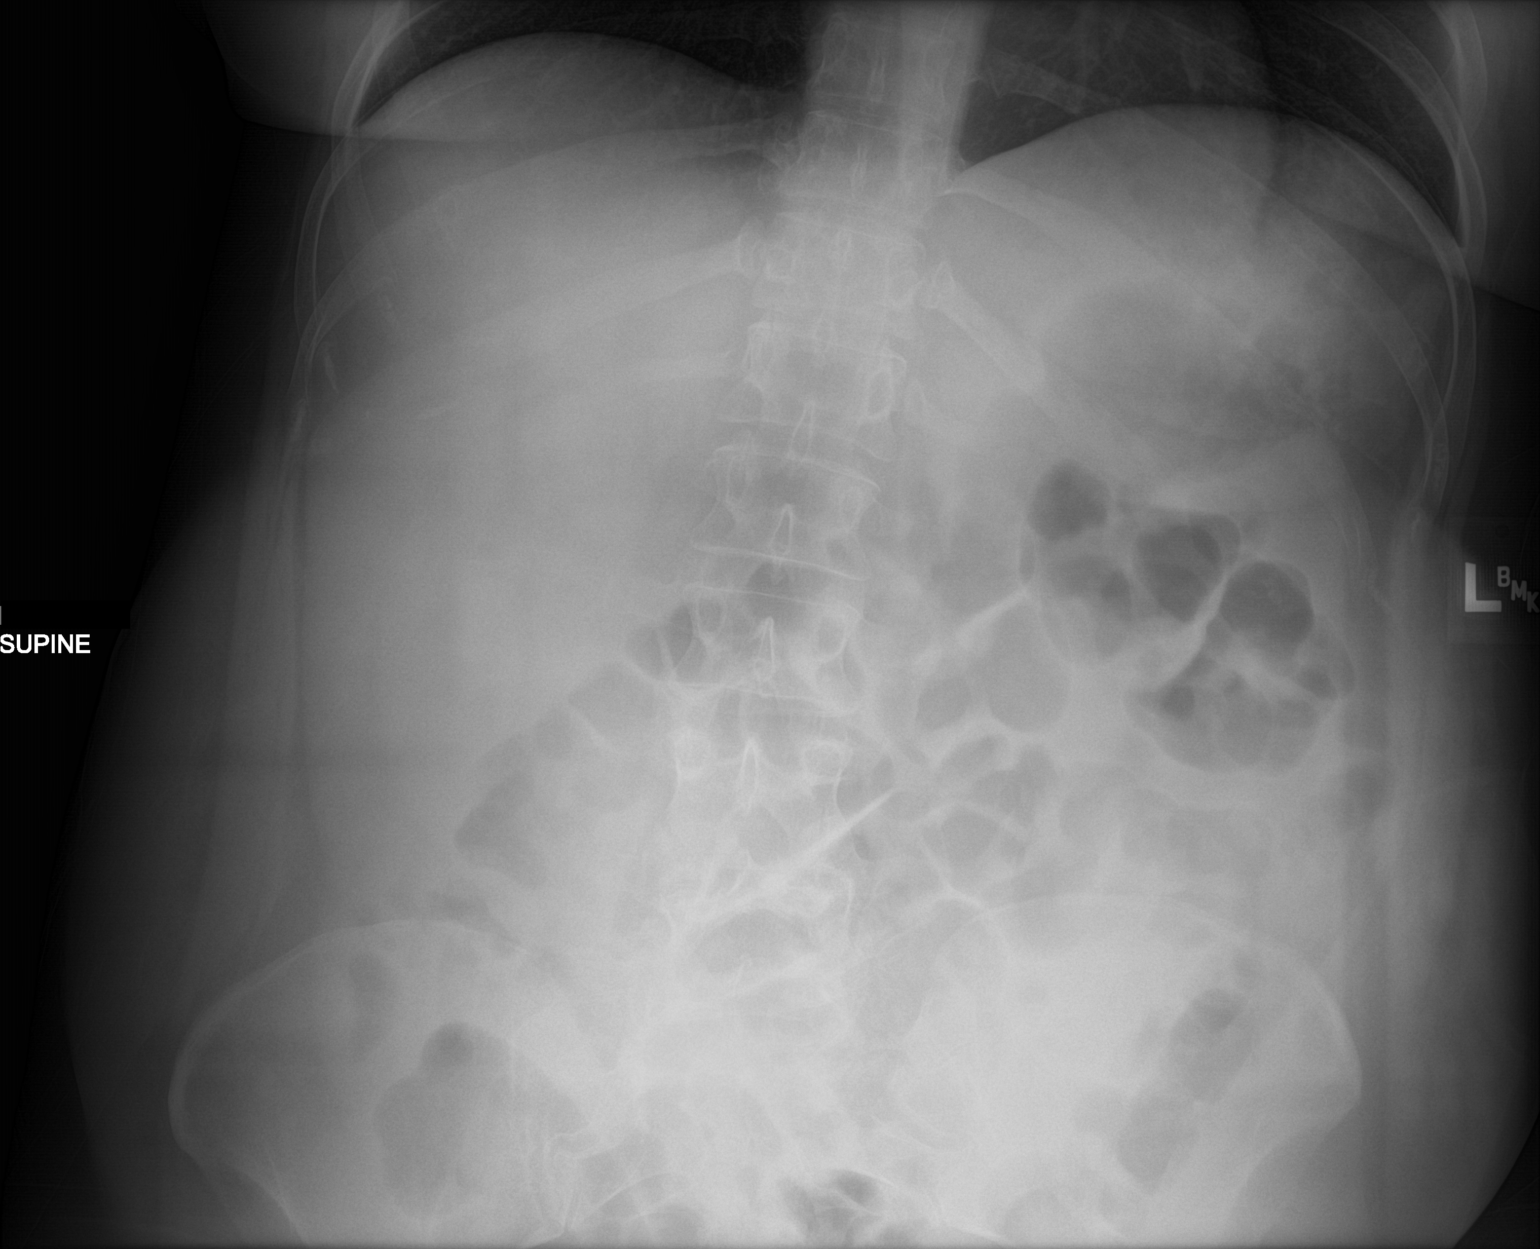

[abdomen kub (2 of 2)]
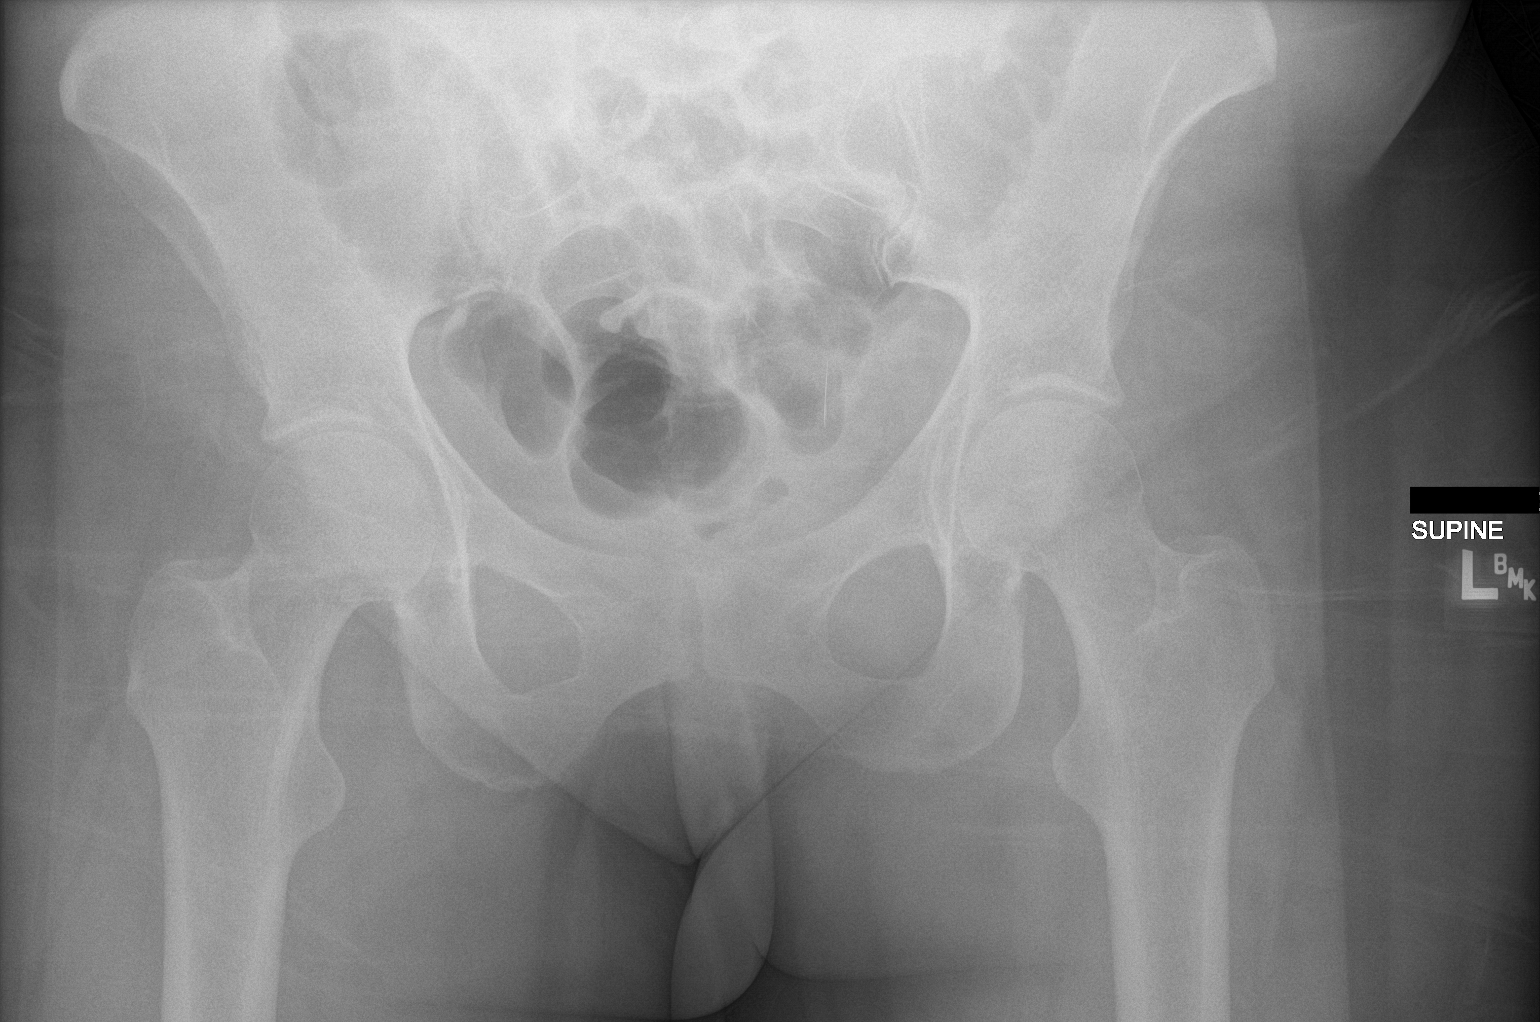

[2 of 2 positions shown; findings below may reference images not displayed]

FINDINGS: The visualized bowel gas pattern is unremarkable. Scattered air and
stool filled loops of colon are seen; no abnormal dilatation of
small bowel loops is seen to suggest small bowel obstruction. No
free intra-abdominal air is identified, though evaluation for free
air is limited on a single supine view.

No radiopaque foreign body is seen.

The visualized osseous structures are within normal limits; the
sacroiliac joints are unremarkable in appearance. The visualized
lung bases are essentially clear.
IMPRESSION: 1. No radiopaque foreign bodies seen.
2. Unremarkable bowel gas pattern; no free intra-abdominal air seen.
Small amount of stool noted in the colon.

## 2016-05-12 IMAGING — CR DG ABDOMEN 2V
1 series · 4 of 4 positions shown · non-contrast
Comparison: [DATE]

ADDENDUM:
Comparison is made with CT scan [DATE]. The 3 metallic wires are
actually superficial subcutaneous within midline lower anterior
chest wall as seen on CT scan please see sagittal image 91.

These results were called by telephone at the time of interpretation
on [DATE] at [DATE] to Dr. ROCKIES, who verbally acknowledged
these results.
CLINICAL DATA: Foreign body ingestion
EXAM:
ABDOMEN - 2 VIEW

[Series 1: dg abd 2 views · 0.14mm/px · 4 of 4 slices shown]
[im 1/4]
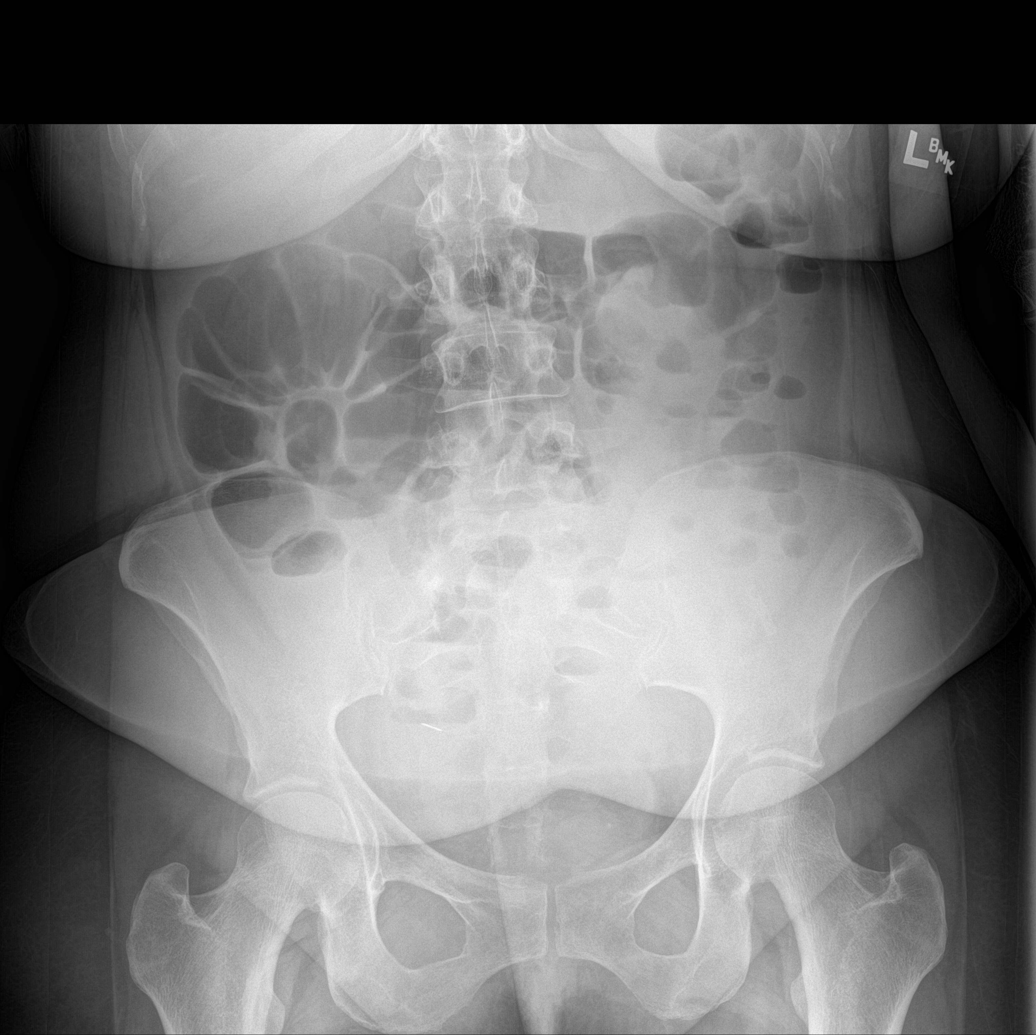
[im 2/4]
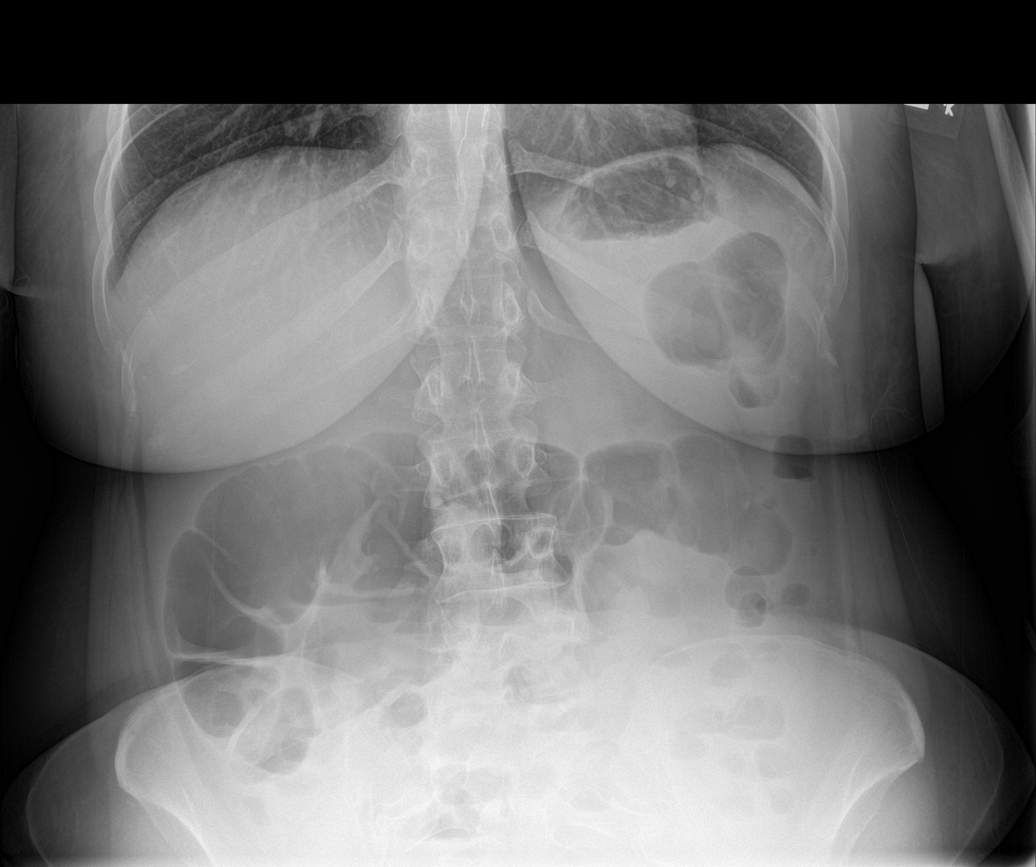
[im 3/4]
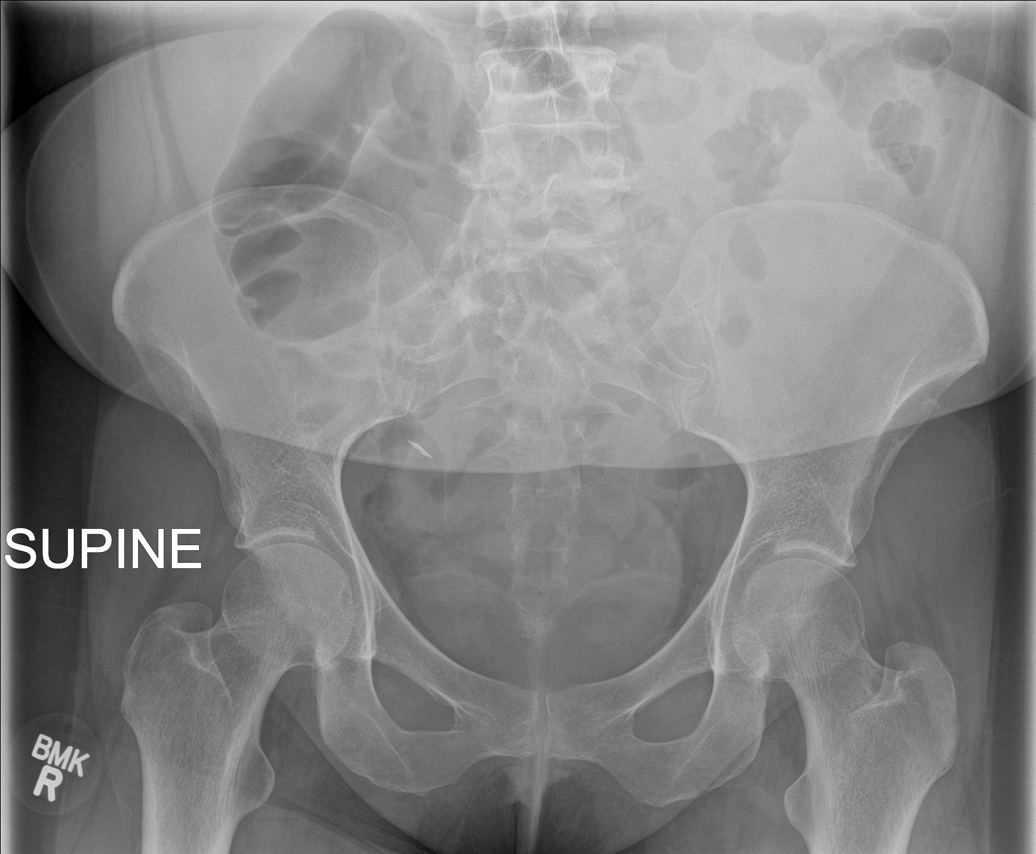
[im 4/4]
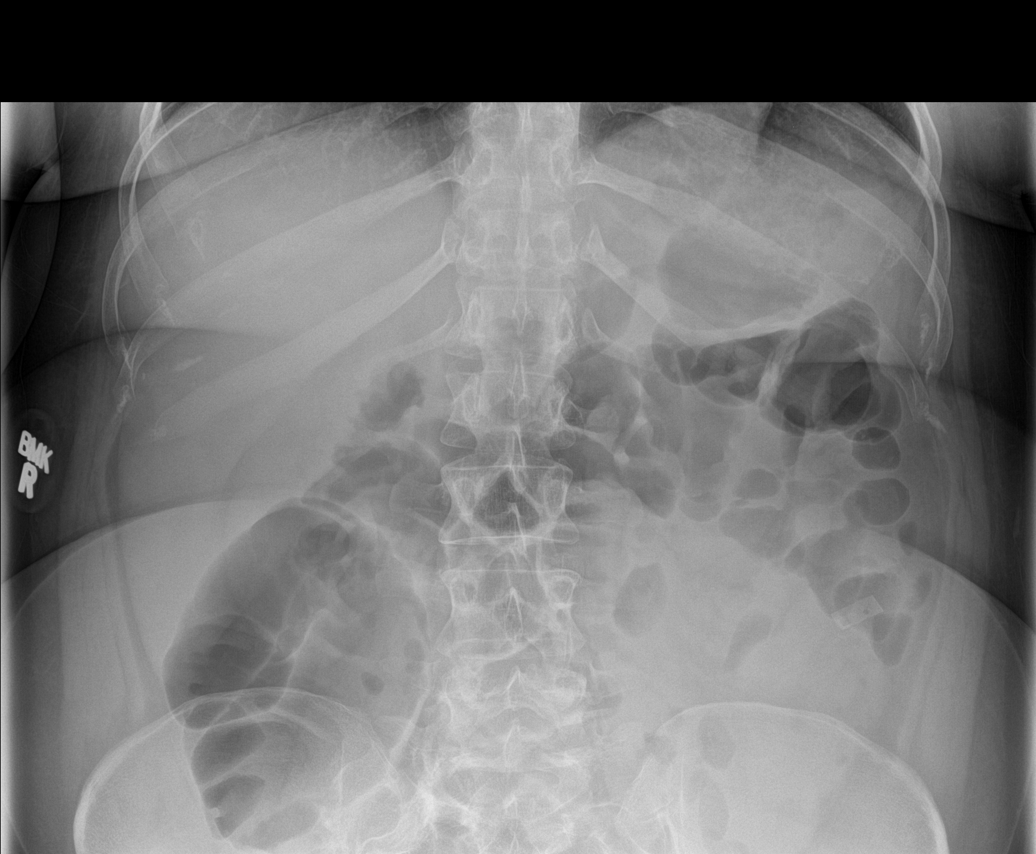

[4 of 4 positions shown; findings below may reference images not displayed]

FINDINGS: Mild gaseous distended small bowel loops mid abdomen probable mild
ileus. Some colonic gas noted in right colon and transverse colon.
There is poorly visualized metallic foreign body fragment in left
lower abdomen measures about 2 cm. A second metallic foreign body
probable blade component is noted in right upper pelvis measures 9
mm. There are 3 new wires the longest measures 2.8 cm length in the
region of distal esophagus.
IMPRESSION: Mild gaseous distended small bowel loops mid abdomen probable mild
ileus. Some colonic gas noted in right colon and transverse colon.
There is poorly visualized metallic foreign body fragment in left
lower abdomen measures about 2 cm. A second metallic foreign body
probable blade component is noted in right upper pelvis measures 9
mm. There are 3 new wires the longest measures 2.8 cm length in the
region of distal esophagus.

These results were called by telephone at the time of interpretation
on [DATE] at [DATE] to Dr. ROCKIES

, who verbally acknowledged these results.

## 2016-05-12 IMAGING — CR DG ABDOMEN 1V
1 series · 1 of 1 positions shown · non-contrast
Comparison: [DATE], [DATE], CT [DATE]

CLINICAL DATA: 25-year-old female with ingestion of foreign body

EXAM:
ABDOMEN - 1 VIEW

[dg abd 1 view]
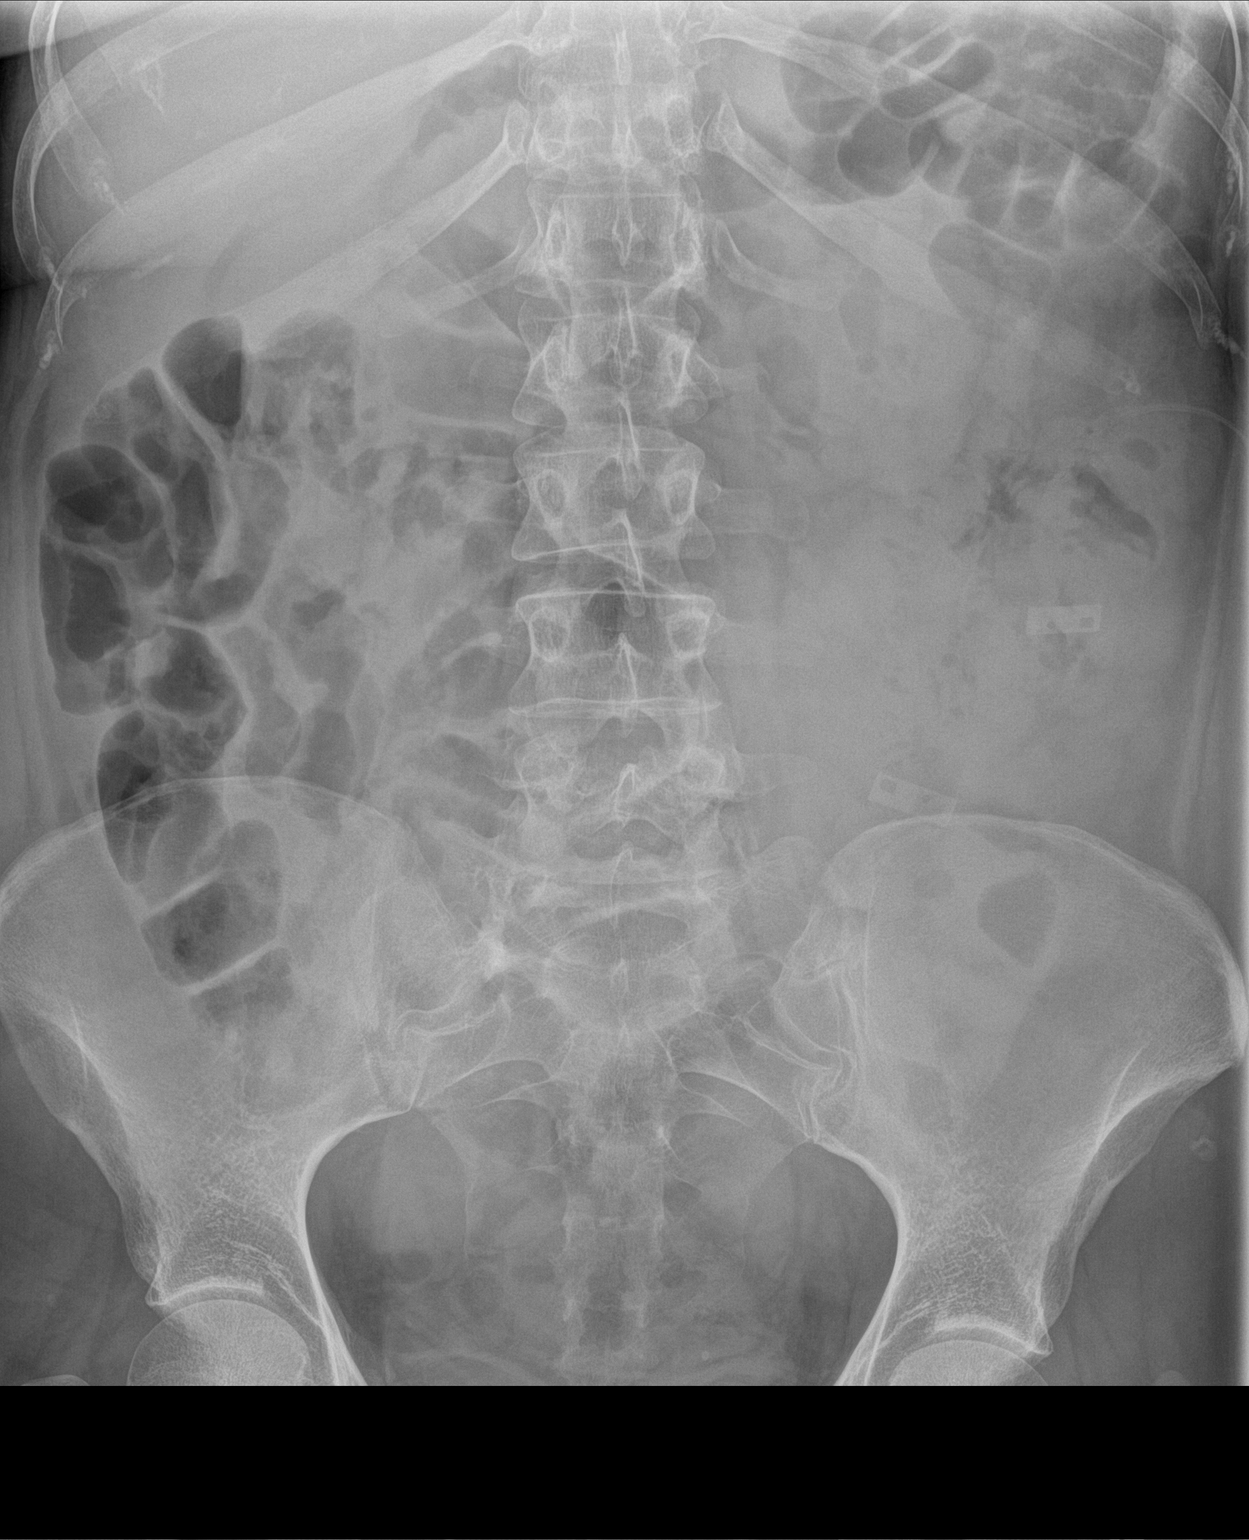

[1 of 1 positions shown; findings below may reference images not displayed]

FINDINGS: Gas within stomach, small bowel, colon.

No radiographic evidence of free intraperitoneal gas.

There are 2 separate radiopaque foreign body in the left abdomen,
presumably within the sigmoid colon given the location on the
comparison CT study.

No abnormally distended small bowel or colon.

Unremarkable skeletal structures.
IMPRESSION: Normal bowel gas pattern.

Two adjacent radiopaque foreign body within the left abdomen,
presumably within the descending colon/ sigmoid colon given the
location on prior CT.

## 2016-05-12 MED ORDER — HYDROCODONE-ACETAMINOPHEN 5-325 MG PO TABS: 1.0000 | ORAL_TABLET | ORAL | Status: DC | PRN

## 2016-05-12 MED ORDER — VENLAFAXINE HCL ER 75 MG PO CP24
150.0000 mg | ORAL_CAPSULE | Freq: Every day | ORAL | Status: DC
  Administered 2016-05-12 – 2016-05-13 (×2): 150 mg via ORAL
  Filled 2016-05-12 (×2): qty 2

## 2016-05-12 MED ORDER — AMOXICILLIN-POT CLAVULANATE 875-125 MG PO TABS
1.0000 | ORAL_TABLET | Freq: Two times a day (BID) | ORAL | Status: DC
  Administered 2016-05-12 – 2016-05-13 (×3): 1 via ORAL
  Filled 2016-05-12 (×3): qty 1

## 2016-05-12 MED ORDER — PEG 3350-KCL-NA BICARB-NACL 420 G PO SOLR
4000.0000 mL | Freq: Once | ORAL | Status: AC
  Administered 2016-05-12: 4000 mL via ORAL
  Filled 2016-05-12: qty 4000

## 2016-05-12 MED ORDER — BUPIVACAINE-EPINEPHRINE 0.25% -1:200000 IJ SOLN
10.0000 mL | Freq: Once | INTRAMUSCULAR | Status: AC
  Administered 2016-05-12: 10 mL
  Filled 2016-05-12: qty 10

## 2016-05-12 MED ORDER — ONDANSETRON HCL 4 MG PO TABS: 4.0000 mg | ORAL_TABLET | Freq: Four times a day (QID) | ORAL | Status: DC | PRN

## 2016-05-12 MED ORDER — AMANTADINE HCL 100 MG PO CAPS
100.0000 mg | ORAL_CAPSULE | Freq: Two times a day (BID) | ORAL | Status: DC
  Administered 2016-05-12 – 2016-05-13 (×4): 100 mg via ORAL
  Filled 2016-05-12 (×5): qty 1

## 2016-05-12 MED ORDER — LURASIDONE HCL 40 MG PO TABS
120.0000 mg | ORAL_TABLET | Freq: Every evening | ORAL | Status: DC
  Administered 2016-05-12: 120 mg via ORAL
  Filled 2016-05-12: qty 3

## 2016-05-12 MED ORDER — FLUPHENAZINE HCL 5 MG PO TABS
5.0000 mg | ORAL_TABLET | Freq: Two times a day (BID) | ORAL | Status: DC
  Administered 2016-05-12 – 2016-05-13 (×4): 5 mg via ORAL
  Filled 2016-05-12 (×4): qty 1

## 2016-05-12 MED ORDER — ACETAMINOPHEN 650 MG RE SUPP: 650.0000 mg | Freq: Four times a day (QID) | RECTAL | Status: DC | PRN

## 2016-05-12 MED ORDER — ONDANSETRON HCL 4 MG/2ML IJ SOLN
4.0000 mg | Freq: Four times a day (QID) | INTRAMUSCULAR | Status: DC | PRN
Start: 2016-05-12 — End: 2016-05-13

## 2016-05-12 MED ORDER — ACETAMINOPHEN 325 MG PO TABS: 650.0000 mg | ORAL_TABLET | Freq: Four times a day (QID) | ORAL | Status: DC | PRN

## 2016-05-12 MED ORDER — HYDROXYZINE HCL 50 MG PO TABS
50.0000 mg | ORAL_TABLET | Freq: Three times a day (TID) | ORAL | Status: DC | PRN
  Filled 2016-05-12: qty 1

## 2016-05-12 MED ORDER — BUPIVACAINE-EPINEPHRINE 0.5% -1:200000 IJ SOLN
10.0000 mL | Freq: Once | INTRAMUSCULAR | Status: DC
  Filled 2016-05-12: qty 10

## 2016-05-12 MED ORDER — OXCARBAZEPINE 300 MG PO TABS
150.0000 mg | ORAL_TABLET | Freq: Two times a day (BID) | ORAL | Status: DC
  Administered 2016-05-12 – 2016-05-13 (×4): 150 mg via ORAL
  Filled 2016-05-12 (×4): qty 1

## 2016-05-12 MED ORDER — FLUPHENAZINE HCL 5 MG PO TABS: 5.0000 mg | ORAL_TABLET | ORAL | Status: DC

## 2016-05-12 MED ORDER — AMLODIPINE BESYLATE 5 MG PO TABS
2.5000 mg | ORAL_TABLET | Freq: Every day | ORAL | Status: DC
  Administered 2016-05-12 – 2016-05-13 (×2): 2.5 mg via ORAL
  Filled 2016-05-12 (×2): qty 1

## 2016-05-12 MED ORDER — LEVOTHYROXINE SODIUM 75 MCG PO TABS
75.0000 ug | ORAL_TABLET | Freq: Every day | ORAL | Status: DC
  Administered 2016-05-12 – 2016-05-13 (×2): 75 ug via ORAL
  Filled 2016-05-12 (×2): qty 1

## 2016-05-12 MED ORDER — ENOXAPARIN SODIUM 40 MG/0.4ML ~~LOC~~ SOLN: 40.0000 mg | SUBCUTANEOUS | Status: DC

## 2016-05-12 MED ORDER — FLUPHENAZINE HCL 5 MG PO TABS
10.0000 mg | ORAL_TABLET | Freq: Every day | ORAL | Status: DC
  Administered 2016-05-12 (×2): 10 mg via ORAL
  Filled 2016-05-12 (×2): qty 2

## 2016-05-12 NOTE — Progress Notes (Signed)
Will place nail removed from patients foot by MD in sterile cup and in patients med bin. Do not want to leave nail at bedside.
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
## 2016-05-12 NOTE — Progress Notes (Signed)
Notified by lab that unable to run aerobic and anerobic testing on sample sent down. Per Dr. Elvina Mattes run aerobic testing on sample that lab already has. Will change order.
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
## 2016-05-12 NOTE — Progress Notes (Signed)
Dr Elvina Mattes has seen the pt and needs the left foot nail be removed at bedside. Given  psych issues and unable to get in touch with mother (guardian) will go ahead and give consent for Dr troxler to perfrom the procedure since it is medically necessary to remove the foreign object
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
## 2016-05-12 NOTE — Op Note (Signed)
Operative note   Surgeon: Dr. Albertine Patricia, DPM.    Assistant: None    Preop diagnosis: Foreign body dorsum left foot    Postop diagnosis: Same    Procedure:   1. Removal of foreign body dorsum left foot          EBL: Less than 5 cc    Anesthesia:local. 2.5 cc of 0.25% Marcaine with epinephrine was injected around the puncture wound site.    Hemostasis: Marcaine with epinephrine    Specimen:. The foreign body was a nail with a blunt in but the head was still attached and this was just inside of the skin area. Proximal 2.5 cm in length.     Complications: None    Operative indications: Patient intentionally forced the splint nail and the dorsum of her left foot. This is a pattern of self injury that she has had for a period time. She was hospitalized because of swallowing razor blades. Patient has drainage from the dorsum of the left foot where the nail was placed. She has a little erythematous reaction with recurrent drainage in the region.    Procedure:  Patient was brought into the OR and placed on the operating table in thesupine position. After anesthesia was obtained theleft lower extremity was prepped and draped in usual sterile fashion.  Operative Report: After an alcohol prep the area was cleansed to remove any drainage debris from the wound sites head of the nail was observed this point and skin was moved away from the head of the nail and forceps were used to grasp the head of the nail and remove it. Tolerated procedure well. Marcaine was used to irrigate out the wound and the area was massaged removing purulence. In a sterile large Band-Aid was placed across the area.    Patient tolerated the procedure and anesthesia well. The culture was taken of the drainage earlier. She'll be started likely on some Augmentin orally. Also recommended a tetanus shot if she is not up to date.

## 2016-05-12 NOTE — ED Notes (Signed)
Transporting patient to room 202-2C

## 2016-05-12 NOTE — Consult Note (Signed)
[name], MD [facility]  [address], [place], [place] [address] Phone: [phone] Fax : [phone]  Consultation  Referring Provider:    Dr. Laveda Norman Primary Care Physician:  Casilda Carls Primary Gastroenterologist:           Reason for Consultation:     Foreign body ingestion  Date of Admission:  05/10/2016 Date of Consultation:  05/12/2016         HPI:   Melanie Bell is a 25 y.o. female who has a long history of foreign body ingestion with a recent removal of a razor blade. The patient has also had removal of batteries in the past. The patient now comes in and was reported to have swallowed a razor again. The patient had a x-ray that showed the region to be located in the colon at this time. The patient denies any abdominal pain or bloody stools. The patient had a razor removed from her stomach on the 22nd of this month and batteries removed on the seventh of this month. The patient also was found to have batteries back on October 16 of this year. She also had a battery removed 8 days prior to that. She had a safety pin removed in July 2016 by Dr. Vira Agar. The patient goes to [facility] and [facility] for these removals.  Past Medical History:  Diagnosis Date  . Anxiety   . Asthma   . Depression   . GERD (gastroesophageal reflux disease)   . Hallucinations 09/30/2014   Sizoaffective  . Hyperlipidemia   . Hypertension   . Intentional ingestion of batteries 04/21/2016    2 AAA batteries and 1 thumb tack; intent to hurt herself/notes 04/21/2016  . Tardive dyskinesia 10/2014   recent onset    Past Surgical History:  Procedure Laterality Date  . ABDOMINAL SURGERY     "years ago" to remove foreign objects  . APPENDECTOMY    . BREAST LUMPECTOMY Right   . COLONOSCOPY WITH PROPOFOL N/A 09/10/2015   Procedure: COLONOSCOPY WITH PROPOFOL;  Surgeon: [name], MD;  Location: [facility] ENDOSCOPY;  Service: Endoscopy;  Laterality:  N/A;  . ESOPHAGOGASTRODUODENOSCOPY N/A 11/28/2014   Procedure: ESOPHAGOGASTRODUODENOSCOPY (EGD);  Surgeon: [name], MD;  Location: [facility] ENDOSCOPY;  Service: Endoscopy;  Laterality: N/A;  . ESOPHAGOGASTRODUODENOSCOPY N/A 02/21/2016   Procedure: ESOPHAGOGASTRODUODENOSCOPY (EGD);  Surgeon: Mauri Pole, MD;  Location: [facility] ENDOSCOPY;  Service: Endoscopy;  Laterality: N/A;  . ESOPHAGOGASTRODUODENOSCOPY N/A 04/21/2016   Procedure: ESOPHAGOGASTRODUODENOSCOPY (EGD);  Surgeon: Gatha Mayer, MD;  Location: [facility] ENDOSCOPY;  Service: Endoscopy;  Laterality: N/A;  . ESOPHAGOGASTRODUODENOSCOPY N/A 05/06/2016   Procedure: ESOPHAGOGASTRODUODENOSCOPY (EGD);  Surgeon: Jonathon Bellows, MD;  Location: [facility] ENDOSCOPY;  Service: Gastroenterology;  Laterality: N/A;  . ESOPHAGOGASTRODUODENOSCOPY (EGD) WITH PROPOFOL N/A 02/29/2016   Procedure: ESOPHAGOGASTRODUODENOSCOPY (EGD) WITH PROPOFOL;  Surgeon: [name], MD;  Location: [facility] ENDOSCOPY;  Service: Endoscopy;  Laterality: N/A;  . LAPAROTOMY N/A 09/12/2015   Procedure: EXPLORATORY LAPAROTOMY;  Surgeon: Florene Glen, MD;  Location: [facility] ORS;  Service: General;  Laterality: N/A;  . SIGMOIDOSCOPY N/A 09/12/2015   Procedure: Lonell Face;  Surgeon: Florene Glen, MD;  Location: [facility] ORS;  Service: General;  Laterality: N/A;  . WISDOM TOOTH EXTRACTION      Prior to Admission medications   Medication Sig Start Date End Date Taking? Authorizing Provider  amantadine (SYMMETREL) 100 MG capsule Take 1 capsule (100 mg total) by mouth 2 (two) times daily. 03/07/16  Yes [name], MD  amLODipine (NORVASC) 2.5 MG tablet Take 1 tablet (2.5 mg total) by mouth daily. 06/19/15  Yes [name] [name], MD  fluPHENAZine (PROLIXIN) 5 MG tablet Take 5-10 mg by mouth See admin instructions. Take 5 mg by mouth in the morning, take 5 mg by mouth in the afternoon and take 10 mg by mouth at bedtime.   Yes Historical Provider, MD  levothyroxine (SYNTHROID,  LEVOTHROID) 75 MCG tablet Take 1 tablet (75 mcg total) by mouth daily before breakfast. 06/19/15  Yes [name] [name], MD  Lurasidone HCl (LATUDA) 120 MG TABS Take 120 mg by mouth every evening.   Yes Historical Provider, MD  OXcarbazepine (TRILEPTAL) 150 MG tablet Take 1 tablet (150 mg total) by mouth 2 (two) times daily. 06/19/15  Yes [name] [name], MD  traZODone (DESYREL) 50 MG tablet Take 50 mg by mouth at bedtime.   Yes Historical Provider, MD  venlafaxine XR (EFFEXOR-XR) 150 MG 24 hr capsule Take 1 capsule (150 mg total) by mouth daily with breakfast. 06/19/15  Yes [name], MD    Family History  Problem Relation Age of Onset  . Depression Mother   . Hypertension Mother   . Sleep apnea Mother   . Asthma Mother   . COPD Mother   . Diabetes Mother      Social History  Substance Use Topics  . Smoking status: Current Every Day Smoker    Packs/day: 0.50    Years: 3.00    Types: Cigarettes  . Smokeless tobacco: Never Used  . Alcohol use No    Allergies as of 05/10/2016 - Review Complete 05/10/2016  Allergen Reaction Noted  . Betadine [povidone iodine] Other (See Comments) 09/29/2014  . Iodine Rash 09/29/2014  . Shellfish-derived products Other (See Comments) 11/05/2014    Review of Systems:    All systems reviewed and negative except where noted in HPI.   Physical Exam:  Vital signs in last 24 hours: Temp:  [97.8 F (36.6 C)-98.5 F (36.9 C)] 97.8 F (36.6 C) (12/28 1222) Pulse Rate:  [60-82] 68 (12/28 1222) Resp:  [17-20] 18 (12/28 1222) BP: (108-129)/(62-85) 129/73 (12/28 1222) SpO2:  [97 %-100 %] 97 % (12/28 1222) Weight:  [211 lb 6.4 oz (95.9 kg)] 211 lb 6.4 oz (95.9 kg) (12/28 0117) Last BM Date: 05/12/16 General:   Pleasant, cooperative in NAD Head:  Normocephalic and atraumatic. Eyes:   No icterus.   Conjunctiva pink. PERRLA. Rectal:  Not performed. Msk:  Symmetrical without gross deformities.    Extremities:  Without edema, cyanosis or  clubbing. Neurologic:  Alert and oriented x3;  grossly normal neurologically. Skin:  Intact without significant lesions or rashes. Cervical Nodes:  No significant cervical adenopathy. Psych:  Alert and cooperative. Flat affect.  LAB RESULTS:  Recent Labs  05/10/16 2003 05/12/16 0538  WBC 9.6 6.8  HGB 13.3 13.2  HCT 38.6 39.6  PLT 246 230   BMET  Recent Labs  05/10/16 2003 05/12/16 0538  NA 137 139  K 3.6 3.6  CL 105 106  CO2 25 27  GLUCOSE 108* 137*  BUN 13 12  CREATININE 1.19* 0.72  CALCIUM 9.2 9.2   LFT  Recent Labs  05/10/16 2003  PROT 7.2  ALBUMIN 3.9  AST 48*  ALT 8*  ALKPHOS 77  BILITOT 0.4   PT/INR No results for input(s): LABPROT, INR in the last 72 hours.  STUDIES: Ct Abdomen Pelvis Wo Contrast  Result Date: 05/10/2016 CLINICAL  DATA:  Stomach ache after swallowing a razor blade and thumb tack yesterday EXAM: CT ABDOMEN AND PELVIS WITHOUT CONTRAST TECHNIQUE: Multidetector CT imaging of the abdomen and pelvis was performed following the standard protocol without IV contrast. COMPARISON:  05/10/2016 radiographs of the abdomen and pelvis performed at 2043 hour. FINDINGS: LOWER CHEST: Lung bases are clear. Included heart size is normal. No pericardial effusion. HEPATOBILIARY: No hepatic mass or biliary dilatation. Contracted gallbladder. No gallstones. PANCREAS: Normal. SPLEEN: Normal. ADRENALS/URINARY TRACT: Kidneys are orthotopic. No nephrolithiasis, hydronephrosis or solid renal masses. The unopacified ureters are normal in course and caliber. Urinary bladder is partially distended and unremarkable. Normal adrenal glands. STOMACH/BOWEL: The stomach, small and large bowel are normal in course and caliber without inflammatory changes. Within the cecum is a 20 x 10 x 1 mm radiopaque foreign body consistent with a razor blade. Within the mid descending colon is a more linear metallic foreign body measuring up to 10 mm consistent with ingested tack. No perforation  of bowel. Moderate colonic stool burden is seen throughout large intestine. VASCULAR/LYMPHATIC: Aortoiliac vessels are normal in course and caliber. No lymphadenopathy by CT size criteria. REPRODUCTIVE: Normal. OTHER: No intraperitoneal free fluid or free air. MUSCULOSKELETAL: Nonacute. IMPRESSION: Ingested foreign bodies consistent with a razor blade in the cecum and tack in the mid descending colon. No bowel perforation or free fluid noted. Increased stool burden throughout large bowel. Electronically Signed   By: Patt Royalty M.D.   On: 05/10/2016 22:54   Dg Neck Soft Tissue  Result Date: 05/10/2016 CLINICAL DATA:  Patient states swallowed thumb tack and razor blade. EXAM: NECK SOFT TISSUES - 1+ VIEW COMPARISON:  11/16/2015. FINDINGS: There is no evidence of retropharyngeal soft tissue swelling or epiglottic enlargement. The cervical airway is unremarkable and no radio-opaque foreign body identified. IMPRESSION: Negative. Electronically Signed   By: Staci Righter M.D.   On: 05/10/2016 21:14   Dg Chest 2 View  Result Date: 05/10/2016 CLINICAL DATA:  Patient swallowed   foreign objects. EXAM: CHEST  2 VIEW COMPARISON:  Multiple priors. FINDINGS: The heart size and mediastinal contours are within normal limits. Both lungs are clear. The visualized skeletal structures are unremarkable. Within the soft tissues of the anterior chest, some wire structures are imbedded in the soft tissues. IMPRESSION: No active cardiopulmonary disease.  No foreign body is seen. Electronically Signed   By: Staci Righter M.D.   On: 05/10/2016 21:20   Abd 1 View (kub)  Result Date: 05/12/2016 CLINICAL DATA:  25 year old female with ingestion of foreign body EXAM: ABDOMEN - 1 VIEW COMPARISON:  05/11/2016, 05/10/2016, CT 05/10/2016 FINDINGS: Gas within stomach, small bowel, colon. No radiographic evidence of free intraperitoneal gas. There are 2 separate radiopaque foreign body in the left abdomen, presumably within the sigmoid  colon given the location on the comparison CT study. No abnormally distended small bowel or colon. Unremarkable skeletal structures. IMPRESSION: Normal bowel gas pattern. Two adjacent radiopaque foreign body within the left abdomen, presumably within the descending colon/ sigmoid colon given the location on prior CT. Signed, Dulcy Fanny. Earleen Newport, DO [facility] Electronically Signed   By: Corrie Mckusick D.O.   On: 05/12/2016 07:57   Dg Abdomen 1 View  Result Date: 05/11/2016 CLINICAL DATA:  Ingested foreign body (thumb tack and razor blade EXAM: ABDOMEN - 1 VIEW COMPARISON:  05/10/2016 CT and abdomen radiographs FINDINGS: There is a significant amount of fecal residue within large bowel. There does appear to have  been some antegrade migration of the razor blade like foreign body previously seen in the right hemiabdomen and now situated in the left upper quadrant along the expected course of the distal transverse colon. This foreign body however appears to have separated into 2 separate components which are adjacent to each other, one likely representing the blade and the second, the blade holder. The tack like foreign body appears to project more retrograde from previous location now at the splenic flexure. No free air is identified. No bowel obstruction is seen. IMPRESSION: Separation of the razor blade like foreign body into two components, both having moved antegrade to the expected location of the distal transverse colon. Retrograde movement of the tack like foreign body to the splenic flexure. No free air noted. Electronically Signed   By: Lorenda Royalty M.D.   On: 05/11/2016 20:38   Dg Abdomen 1 View  Result Date: 05/10/2016 CLINICAL DATA:  Patient swallowed foreign objects including thumb tack. EXAM: ABDOMEN - 1 VIEW COMPARISON:  05/06/2016. FINDINGS: Moderate stool burden. There is a radiopaque density overlying the LEFT iliac wing which may be  within small bowel or distal colon. This was not present on 12/22. No visible free air. Negative osseous structures. IMPRESSION: Suspected thumb tack LEFT lower quadrant. This could lie within small bowel or distal colon. Moderate stool burden. Electronically Signed   By: Staci Righter M.D.   On: 05/10/2016 21:17   Dg Foot Complete Left  Result Date: 05/11/2016 CLINICAL DATA:  Foreign body in left foot. Pain, itching, redness, swelling. EXAM: LEFT FOOT - COMPLETE 3+ VIEW COMPARISON:  02/11/2016 FINDINGS: Screw shaped foreign body projects over the dorsal soft tissues of the left foot overlying the first tarsal metatarsal region, in a similar location to previously seen but different foreign body. No underlying bony abnormality. No fracture, subluxation or dislocation. IMPRESSION: Screw shaped foreign body projects over the medial dorsal soft tissues of the foot overlying the first MTP joint. No underlying bony abnormality. Electronically Signed   By: Rolm Baptise M.D.   On: 05/11/2016 18:10      Impression / Plan:   Melanie Bell is a 25 y.o. y/o female with a history of multiple admissions to the ER for foreign body ingestion. The patient has been told that the foreign bodies have passed to the colon and no further endoscopic intervention will be performed on her. She has been told the risks of possible surgery and death if she continues her present behavior. I believe by continued removal of the foreign objects it reinforces her to continue this behavior. Have made it clear to her that we will not becoming emergently to take out foreign bodies if she continues ingesting them. She states that she understands this  Thank you for involving me in the care of this patient.      LOS: 0 days   [name], MD  05/12/2016, 3:51 PM   Note: This dictation was prepared with Dragon dictation along with smaller phrase technology. Any transcriptional errors that result from this process are  unintentional.

## 2016-05-12 NOTE — Progress Notes (Signed)
Per Dr. Posey Pronto order previous KUB at 6pm again after patient has completed Golytely. RN to order golytely again if razor blades are still seen

## 2016-05-12 NOTE — Progress Notes (Signed)
Attempted to obtain IV access. Per Dr. Posey Pronto okay for patient to have no IV access at this time. Patient may have to have IV access at a later date.

## 2016-05-12 NOTE — Consult Note (Signed)
Patient Demographics  Melanie Bell, is a 25 y.o. female   MRN: [id number]   DOB - 1990/09/04  Admit Date - 05/10/2016    Outpatient Primary MD for the patient is Glasscock requested in the Hospital by Fritzi Mandes, MD, On 05/12/2016    Reason for : Foreign body dorsum of the left foot   With History of -  Past Medical History:  Diagnosis Date  . Anxiety   . Asthma   . Depression   . GERD (gastroesophageal reflux disease)   . Hallucinations 09/30/2014   Sizoaffective  . Hyperlipidemia   . Hypertension   . Intentional ingestion of batteries 04/21/2016    2 AAA batteries and 1 thumb tack; intent to hurt herself/notes 04/21/2016  . Tardive dyskinesia 10/2014   recent onset      Past Surgical History:  Procedure Laterality Date  . ABDOMINAL SURGERY     "years ago" to remove foreign objects  . APPENDECTOMY    . BREAST LUMPECTOMY Right   . COLONOSCOPY WITH PROPOFOL N/A 09/10/2015   Procedure: COLONOSCOPY WITH PROPOFOL;  Surgeon: [name], MD;  Location: [facility] ENDOSCOPY;  Service: Endoscopy;  Laterality: N/A;  . ESOPHAGOGASTRODUODENOSCOPY N/A 11/28/2014   Procedure: ESOPHAGOGASTRODUODENOSCOPY (EGD);  Surgeon: [name], MD;  Location: [facility] ENDOSCOPY;  Service: Endoscopy;  Laterality: N/A;  . ESOPHAGOGASTRODUODENOSCOPY N/A 02/21/2016   Procedure: ESOPHAGOGASTRODUODENOSCOPY (EGD);  Surgeon: Mauri Pole, MD;  Location: [facility] ENDOSCOPY;  Service: Endoscopy;  Laterality: N/A;  . ESOPHAGOGASTRODUODENOSCOPY N/A 04/21/2016   Procedure: ESOPHAGOGASTRODUODENOSCOPY (EGD);  Surgeon: Gatha Mayer, MD;  Location: [facility] ENDOSCOPY;  Service: Endoscopy;  Laterality: N/A;  . ESOPHAGOGASTRODUODENOSCOPY N/A 05/06/2016   Procedure: ESOPHAGOGASTRODUODENOSCOPY (EGD);  Surgeon: Jonathon Bellows, MD;   Location: [facility] ENDOSCOPY;  Service: Gastroenterology;  Laterality: N/A;  . ESOPHAGOGASTRODUODENOSCOPY (EGD) WITH PROPOFOL N/A 02/29/2016   Procedure: ESOPHAGOGASTRODUODENOSCOPY (EGD) WITH PROPOFOL;  Surgeon: [name], MD;  Location: [facility] ENDOSCOPY;  Service: Endoscopy;  Laterality: N/A;  . LAPAROTOMY N/A 09/12/2015   Procedure: EXPLORATORY LAPAROTOMY;  Surgeon: Florene Glen, MD;  Location: [facility] ORS;  Service: General;  Laterality: N/A;  . SIGMOIDOSCOPY N/A 09/12/2015   Procedure: Lonell Face;  Surgeon: Florene Glen, MD;  Location: [facility] ORS;  Service: General;  Laterality: N/A;  . WISDOM TOOTH EXTRACTION      in for   Chief Complaint  Patient presents with  . Swallowed Foreign Body     HPI  Melanie Bell  is a 25 y.o. female, Patient was hospitalized yesterday primarily due to swallowing razor blades. Also she states 2 days ago she showed day female type object into the dorsum of her foot. 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 Currently she has 2 razor blades in her get an been treated for that here at the hospital. I'm consulted because of the nail and the dorsum of her left foot.    Review of Systems  : Patient is alert and his response to my questions. States her foot does hurt some.  In addition to the HPI above,  No Fever-chills, No Headache, No changes with Vision or hearing, No problems swallowing food  or Liquids, No Chest pain, Cough or Shortness of Breath, No Abdominal pain, No Nausea or Vommitting, Bowel movements are regular, No Blood in stool or Urine, No dysuria, No new skin rashes or bruises, No new joints pains-aches,  No new weakness, tingling, numbness in any extremity, No recent weight gain or loss, No polyuria, polydypsia or polyphagia, Patient obviously has significant history of mental health issues.  A full 10 point Review of Systems was done, except as stated above, all other Review of Systems were  negative.   Social History Social History  Substance Use Topics  . Smoking status: Current Every Day Smoker    Packs/day: 0.50    Years: 3.00    Types: Cigarettes  . Smokeless tobacco: Never Used  . Alcohol use No     Family History Family History  Problem Relation Age of Onset  . Depression Mother   . Hypertension Mother   . Sleep apnea Mother   . Asthma Mother   . COPD Mother   . Diabetes Mother      Prior to Admission medications   Medication Sig Start Date End Date Taking? Authorizing Provider  amantadine (SYMMETREL) 100 MG capsule Take 1 capsule (100 mg total) by mouth 2 (two) times daily. 03/07/16  Yes [name], MD  amLODipine (NORVASC) 2.5 MG tablet Take 1 tablet (2.5 mg total) by mouth daily. 06/19/15  Yes [name] [name], MD  fluPHENAZine (PROLIXIN) 5 MG tablet Take 5-10 mg by mouth See admin instructions. Take 5 mg by mouth in the morning, take 5 mg by mouth in the afternoon and take 10 mg by mouth at bedtime.   Yes Historical Provider, MD  levothyroxine (SYNTHROID, LEVOTHROID) 75 MCG tablet Take 1 tablet (75 mcg total) by mouth daily before breakfast. 06/19/15  Yes [name] [name], MD  Lurasidone HCl (LATUDA) 120 MG TABS Take 120 mg by mouth every evening.   Yes Historical Provider, MD  OXcarbazepine (TRILEPTAL) 150 MG tablet Take 1 tablet (150 mg total) by mouth 2 (two) times daily. 06/19/15  Yes [name] [name], MD  traZODone (DESYREL) 50 MG tablet Take 50 mg by mouth at bedtime.   Yes Historical Provider, MD  venlafaxine XR (EFFEXOR-XR) 150 MG 24 hr capsule Take 1 capsule (150 mg total) by mouth daily with breakfast. 06/19/15  Yes [name] [name], MD    Anti-infectives    None      Scheduled Meds: . amantadine  100 mg Oral BID  . amLODipine  2.5 mg Oral Daily  . bupivacaine-EPINEPHrine  10 mL Infiltration Once  . fluPHENAZine  10 mg Oral QHS  . fluPHENAZine  5 mg Oral BID  . levothyroxine  75 mcg Oral QAC breakfast  .  lurasidone  120 mg Oral QPM  . OXcarbazepine  150 mg Oral BID  . venlafaxine XR  150 mg Oral Q breakfast   Continuous Infusions: PRN Meds:.acetaminophen **OR** acetaminophen, ondansetron **OR** ondansetron (ZOFRAN) IV  Allergies  Allergen Reactions  . Betadine [Povidone Iodine] Other (See Comments)    Reaction:  Unknown   . Iodine Rash  . Shellfish-Derived Products Other (See Comments)    Reaction:  Unknown     Physical Exam  Vitals  Blood pressure 129/73, pulse 68, temperature 97.8 F (36.6 C), temperature source Oral, resp. rate 18, height 5\' 6"  (1.676 m), weight 95.9 kg (211 lb 6.4 oz), SpO2 97 %.  Lower Extremity exam:  Vascular: DP and PT pulses +2 over 4 bilateral  Dermatological:  Patient has a draining area on the dorsum of the left foot over the first metatarsal cuneiform joint there is some purulence draining from the region with mild ring of erythema only about 5 mm in diameter around a wound sites where there is visible portion of the head of the small screw or nail.  Neurological: Unremarkable  Ortho: No gross deformities. X-ray review shows that the nail is superficial and does not appear to breech bone or joint.  Data Review  CBC  Recent Labs Lab 05/10/16 2003 05/12/16 0538  WBC 9.6 6.8  HGB 13.3 13.2  HCT 38.6 39.6  PLT 246 230  MCV 90.8 91.9  MCH 31.3 30.5  MCHC 34.5 33.2  RDW 13.9 13.9   ------------------------------------------------------------------------------------------------------------------  Chemistries   Recent Labs Lab 05/10/16 2003 05/12/16 0538  NA 137 139  K 3.6 3.6  CL 105 106  CO2 25 27  GLUCOSE 108* 137*  BUN 13 12  CREATININE 1.19* 0.72  CALCIUM 9.2 9.2  AST 48*  --   ALT 8*  --   ALKPHOS 77  --   BILITOT 0.4  --    ------------------------------------------------------------------------------------------------------------------ estimated creatinine clearance is 124.3 mL/min (by C-G formula based on SCr of  0.72 mg/dL). ------------------------------------------------------------------------------------------------------------------ No results for input(s): TSH, T4TOTAL, T3FREE, THYROIDAB in the last 72 hours.  Invalid input(s): FREET3   Coagulation profile No results for input(s): INR, PROTIME in the last 168 hours. ------------------------------------------------------------------------------------------------------------------- No results for input(s): DDIMER in the last 72 hours. -------------------------------------------------------------------------------------------------------------------  Cardiac Enzymes No results for input(s): CKMB, TROPONINI, MYOGLOBIN in the last 168 hours.  Invalid input(s): CK ------------------------------------------------------------------------------------------------------------------ Invalid input(s): POCBNP   ---------------------------------------------------------------------------------------------------------------  Urinalysis    Component Value Date/Time   COLORURINE YELLOW 04/23/2016 [place] 04/23/2016 0734   APPEARANCEUR Clear 09/01/2014 1756   LABSPEC 1.016 04/23/2016 0734   LABSPEC 1.005 09/01/2014 1756   PHURINE 6.0 04/23/2016 0734   GLUCOSEU NEGATIVE 04/23/2016 0734   GLUCOSEU Negative 09/01/2014 1756   HGBUR NEGATIVE 04/23/2016 0734   BILIRUBINUR NEGATIVE 04/23/2016 0734   BILIRUBINUR Negative 09/01/2014 1756   KETONESUR NEGATIVE 04/23/2016 0734   PROTEINUR NEGATIVE 04/23/2016 0734   NITRITE NEGATIVE 04/23/2016 0734   LEUKOCYTESUR NEGATIVE 04/23/2016 0734   LEUKOCYTESUR Negative 09/01/2014 1756     Imaging results:   Ct Abdomen Pelvis Wo Contrast  Result Date: 05/10/2016 CLINICAL DATA:  Stomach ache after swallowing a razor blade and thumb tack yesterday EXAM: CT ABDOMEN AND PELVIS WITHOUT CONTRAST TECHNIQUE: Multidetector CT imaging of the abdomen and pelvis was performed following the standard  protocol without IV contrast. COMPARISON:  05/10/2016 radiographs of the abdomen and pelvis performed at 2043 hour. FINDINGS: LOWER CHEST: Lung bases are clear. Included heart size is normal. No pericardial effusion. HEPATOBILIARY: No hepatic mass or biliary dilatation. Contracted gallbladder. No gallstones. PANCREAS: Normal. SPLEEN: Normal. ADRENALS/URINARY TRACT: Kidneys are orthotopic. No nephrolithiasis, hydronephrosis or solid renal masses. The unopacified ureters are normal in course and caliber. Urinary bladder is partially distended and unremarkable. Normal adrenal glands. STOMACH/BOWEL: The stomach, small and large bowel are normal in course and caliber without inflammatory changes. Within the cecum is a 20 x 10 x 1 mm radiopaque foreign body consistent with a razor blade. Within the mid descending colon is a more linear metallic foreign body measuring up to 10 mm consistent with ingested tack. No perforation of bowel. Moderate colonic stool burden is seen throughout large intestine. VASCULAR/LYMPHATIC: Aortoiliac vessels are normal in course and caliber. No lymphadenopathy  by CT size criteria. REPRODUCTIVE: Normal. OTHER: No intraperitoneal free fluid or free air. MUSCULOSKELETAL: Nonacute. IMPRESSION: Ingested foreign bodies consistent with a razor blade in the cecum and tack in the mid descending colon. No bowel perforation or free fluid noted. Increased stool burden throughout large bowel. Electronically Signed   By: Ivis Royalty M.D.   On: 05/10/2016 22:54   Dg Neck Soft Tissue  Result Date: 05/10/2016 CLINICAL DATA:  Patient states swallowed thumb tack and razor blade. EXAM: NECK SOFT TISSUES - 1+ VIEW COMPARISON:  11/16/2015. FINDINGS: There is no evidence of retropharyngeal soft tissue swelling or epiglottic enlargement. The cervical airway is unremarkable and no radio-opaque foreign body identified. IMPRESSION: Negative. Electronically Signed   By: Staci Righter M.D.   On: 05/10/2016 21:14    Dg Chest 2 View  Result Date: 05/10/2016 CLINICAL DATA:  Patient swallowed   foreign objects. EXAM: CHEST  2 VIEW COMPARISON:  Multiple priors. FINDINGS: The heart size and mediastinal contours are within normal limits. Both lungs are clear. The visualized skeletal structures are unremarkable. Within the soft tissues of the anterior chest, some wire structures are imbedded in the soft tissues. IMPRESSION: No active cardiopulmonary disease.  No foreign body is seen. Electronically Signed   By: Staci Righter M.D.   On: 05/10/2016 21:20   Abd 1 View (kub)  Result Date: 05/12/2016 CLINICAL DATA:  25 year old female with ingestion of foreign body EXAM: ABDOMEN - 1 VIEW COMPARISON:  05/11/2016, 05/10/2016, CT 05/10/2016 FINDINGS: Gas within stomach, small bowel, colon. No radiographic evidence of free intraperitoneal gas. There are 2 separate radiopaque foreign body in the left abdomen, presumably within the sigmoid colon given the location on the comparison CT study. No abnormally distended small bowel or colon. Unremarkable skeletal structures. IMPRESSION: Normal bowel gas pattern. Two adjacent radiopaque foreign body within the left abdomen, presumably within the descending colon/ sigmoid colon given the location on prior CT. Signed, Dulcy Fanny. Earleen Newport, DO [facility] Electronically Signed   By: Corrie Mckusick D.O.   On: 05/12/2016 07:57   Dg Abdomen 1 View  Result Date: 05/11/2016 CLINICAL DATA:  Ingested foreign body (thumb tack and razor blade EXAM: ABDOMEN - 1 VIEW COMPARISON:  05/10/2016 CT and abdomen radiographs FINDINGS: There is a significant amount of fecal residue within large bowel. There does appear to have been some antegrade migration of the razor blade like foreign body previously seen in the right hemiabdomen and now situated in the left upper quadrant along the expected course of the distal transverse colon. This foreign body  however appears to have separated into 2 separate components which are adjacent to each other, one likely representing the blade and the second, the blade holder. The tack like foreign body appears to project more retrograde from previous location now at the splenic flexure. No free air is identified. No bowel obstruction is seen. IMPRESSION: Separation of the razor blade like foreign body into two components, both having moved antegrade to the expected location of the distal transverse colon. Retrograde movement of the tack like foreign body to the splenic flexure. No free air noted. Electronically Signed   By: Maile Royalty M.D.   On: 05/11/2016 20:38   Dg Abdomen 1 View  Result Date: 05/10/2016 CLINICAL DATA:  Patient swallowed foreign objects including thumb tack. EXAM: ABDOMEN - 1 VIEW COMPARISON:  05/06/2016. FINDINGS: Moderate stool burden. There is a radiopaque density overlying the LEFT iliac wing which may be within  small bowel or distal colon. This was not present on 12/22. No visible free air. Negative osseous structures. IMPRESSION: Suspected thumb tack LEFT lower quadrant. This could lie within small bowel or distal colon. Moderate stool burden. Electronically Signed   By: Staci Righter M.D.   On: 05/10/2016 21:17   Dg Foot Complete Left  Result Date: 05/11/2016 CLINICAL DATA:  Foreign body in left foot. Pain, itching, redness, swelling. EXAM: LEFT FOOT - COMPLETE 3+ VIEW COMPARISON:  02/11/2016 FINDINGS: Screw shaped foreign body projects over the dorsal soft tissues of the left foot overlying the first tarsal metatarsal region, in a similar location to previously seen but different foreign body. No underlying bony abnormality. No fracture, subluxation or dislocation. IMPRESSION: Screw shaped foreign body projects over the medial dorsal soft tissues of the foot overlying the first MTP joint. No underlying bony abnormality. Electronically Signed   By: Rolm Baptise M.D.   On: 05/11/2016 18:10     Assessment & Plan: Patient has foreign body dorsum left foot placed by her intentionally into the skin and subcutaneous tissue. Nail appears to be approximately 2-1/2 cm in length with only a very small portion of the head visible at this timeframe. Plan: Patient was injected with 3 cc 0.5% Marcaine with epinephrine in order to numb the area and allow me to remove the nail from the subcutaneous tissues. She tolerated this well and I will order some antibiotics weren't utilized and will follow her. I discussed this with the patient had a time. Principal Problem:   Gastric foreign body Active Problems:   Hypothyroidism   Hypertension   Schizoaffective disorder, bipolar type (HCC)  Perry Mount M.D on 05/12/2016 at 2:17 PM  Thank you for the consult, we will follow the patient with you in the Hospital.

## 2016-05-12 NOTE — Progress Notes (Signed)
Per Dr. Posey Pronto okay to place new psych order so that MD can come see patient. Will discontinue old order.

## 2016-05-12 NOTE — Progress Notes (Signed)
Placed order for Marcan and epi at request of Dr. Elvina Mattes. MD to perform procedure at the bedside.

## 2016-05-13 ENCOUNTER — Observation Stay: Payer: Medicaid Other

## 2016-05-13 IMAGING — CR DG ABDOMEN 1V
1 series · 2 of 2 positions shown · non-contrast
Comparison: [DATE].

CLINICAL DATA: History of foreign body.

EXAM:
ABDOMEN - 1 VIEW

[Series 1: dg abd 1 view · 0.14mm/px · 2 of 2 slices shown]
[im 1/2]
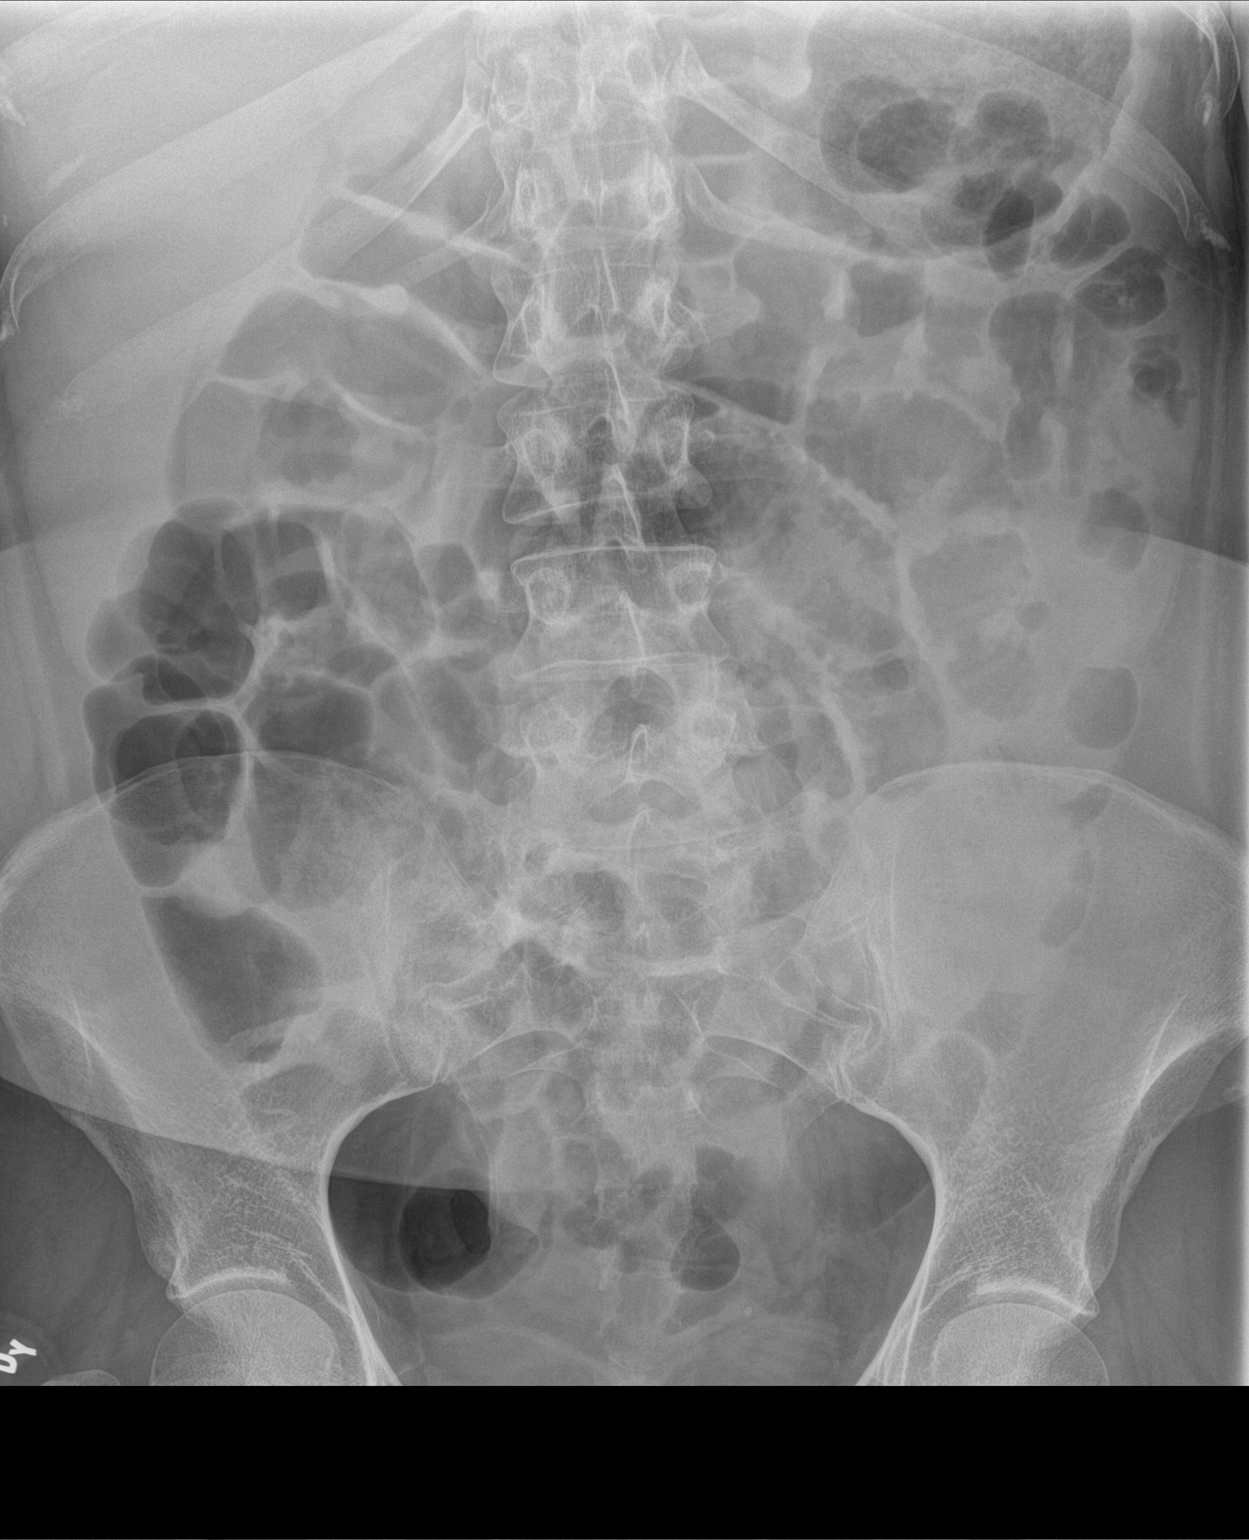
[im 2/2]
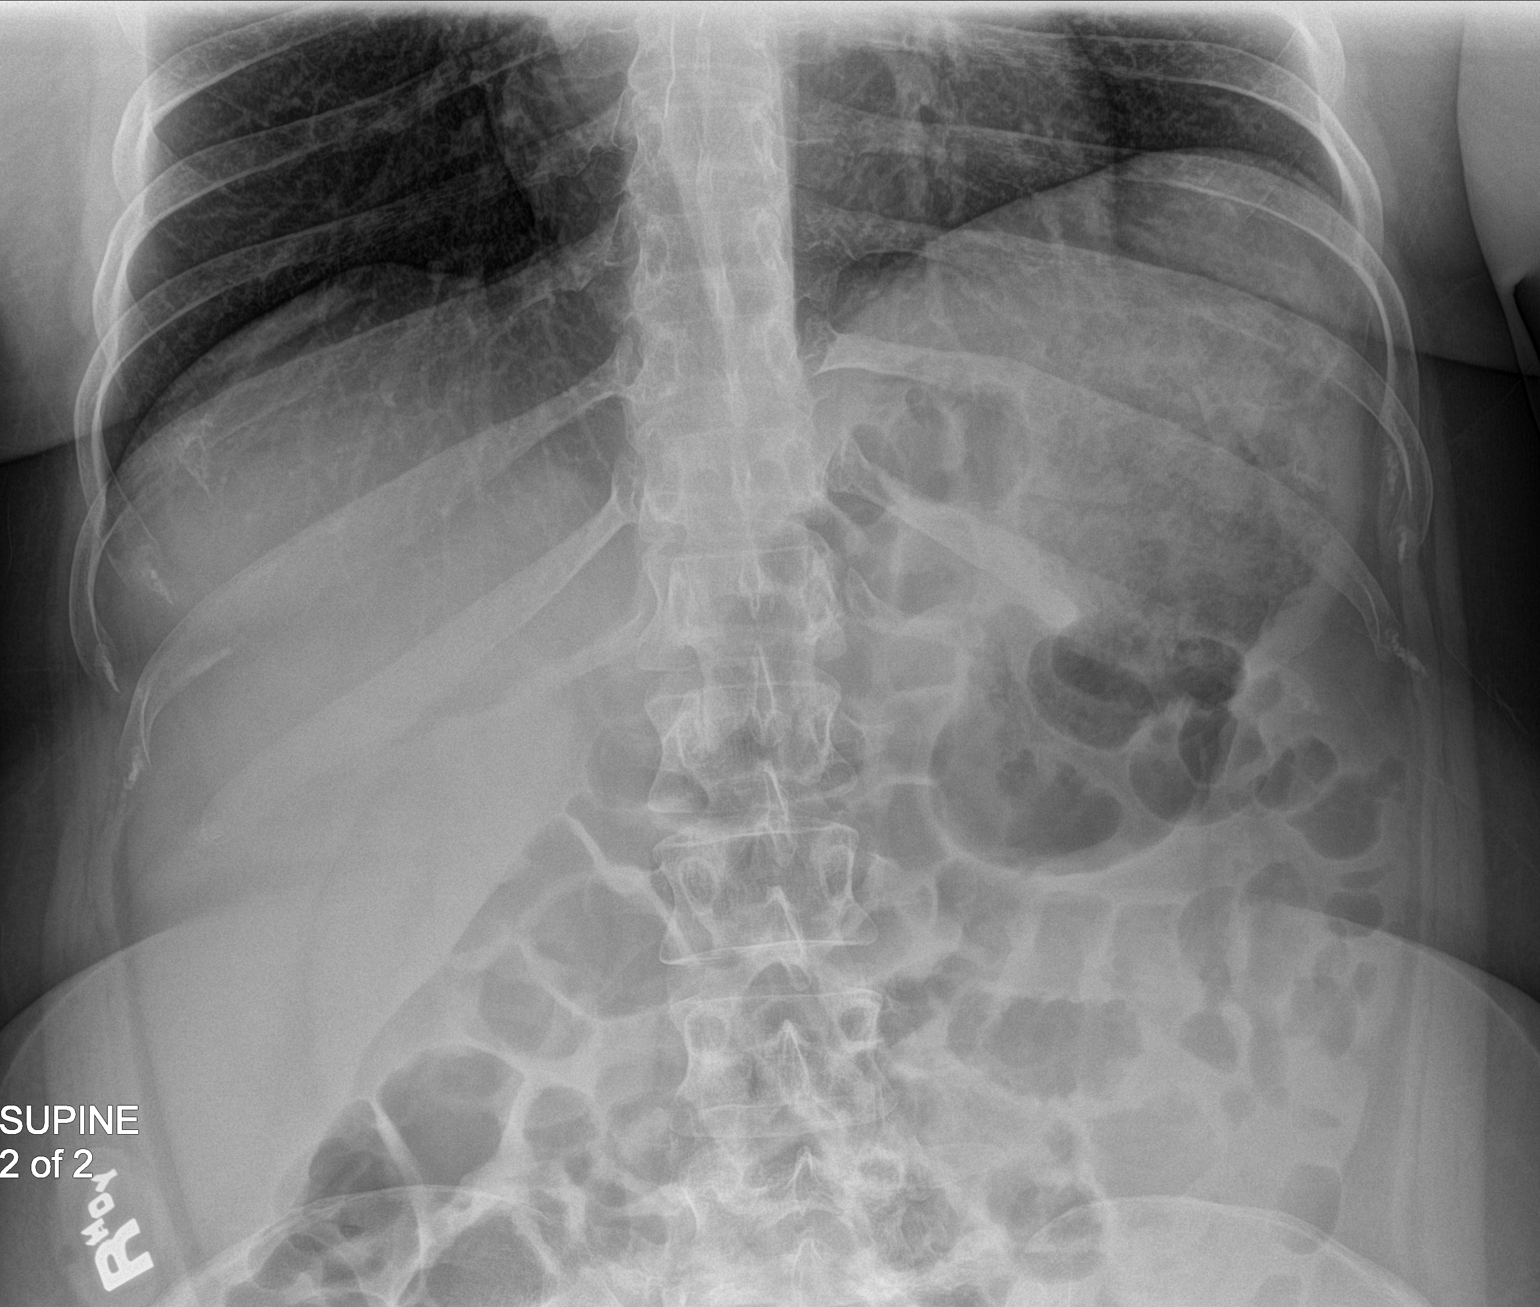

[2 of 2 positions shown; findings below may reference images not displayed]

FINDINGS: The previously identified RIGHT lower quadrant and LEFT lower
quadrant foreign objects are not currently visualized. Presumably
these have passed per rectum. No new foreign objects are seen.
IMPRESSION: Nonobstructive gas pattern.  No visualized radiopaque foreign body.

## 2016-05-13 MED ORDER — PEG 3350-KCL-NA BICARB-NACL 420 G PO SOLR
4000.0000 mL | Freq: Once | ORAL | Status: AC
  Administered 2016-05-13: 4000 mL via ORAL
  Filled 2016-05-13: qty 4000

## 2016-05-13 MED ORDER — AMOXICILLIN-POT CLAVULANATE 875-125 MG PO TABS: 1.0000 | ORAL_TABLET | Freq: Two times a day (BID) | ORAL | 0 refills | Status: DC

## 2016-05-13 NOTE — Progress Notes (Signed)
Will discontinue IVC as patient is to be discharged to group home and need to print papers

## 2016-05-13 NOTE — Progress Notes (Addendum)
Pt A and O x 4. VSS. Pt tolerating diet well. No complaints of pain or nausea. No IV access, prescription sent to pharmacy. KUB performed prior to discharge. Patient had passed two razor blades today as a result of patient drinking golytely. Pt voiced understanding of discharge instructions with no further questions. Discharge instructions given to group home director. Pt discharged via wheelchair with nurse. FL2 faxed over to group home. Razor blades and nail that was removed from foot has been discarded in the sharps.

## 2016-05-13 NOTE — Progress Notes (Signed)
Per Dr. Posey Pronto place order for KUB at 1pm

## 2016-05-13 NOTE — Progress Notes (Signed)
10:55 AM       Per Dr. Posey Pronto okay to place order for IVC sitter
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
## 2016-05-13 NOTE — Clinical Social Work Note (Signed)
Physician to discharge patient to return to [facility]. CSW has contacted Allene Pyo and notified her of discharge and she stated she is out on appointments with other residents and will call back to let [name] know when she will pick her up today. Patient's nurse to call patient's mother to notify. CSW has contacted DSS caseworker to notify her of discharge as well. Shela Leff MSW,LcSW [phone]

## 2016-05-13 NOTE — NC FL2 (Signed)
[place] LEVEL OF CARE SCREENING TOOL     IDENTIFICATION  Patient [name] Birthdate: Jul 31, 1990 Sex: female Admission Date (Current Location): 05/10/2016  [place] and [place] Number:  Engineering geologist and Address:  [facility], [address], [place], [place] [address]      Provider Number: [id number]  Attending Physician Name and Address:  Fritzi Mandes, MD  Relative Name and Phone Number:       Current Level of Care: Hospital Recommended Level of Care: [place] Prior Approval Number:    Date Approved/Denied:   PASRR Number:    Discharge Plan:  (family care home)    Current Diagnoses: Patient Active Problem List   Diagnosis Date Noted  . Malingering 05/06/2016  . Foreign body aspiration   . FB GI (foreign body in gastrointestinal tract)   . Swallowed foreign body 04/23/2016  . Foreign body ingestion 04/21/2016  . Gastric foreign body   . Breast tumor 03/05/2016  . Schizoaffective disorder, bipolar type ([place]) 11/06/2014  . Tobacco use disorder 09/30/2014  . Hypothyroidism 09/29/2014  . Hypertension 09/29/2014    Orientation RESPIRATION BLADDER Height & Weight     Self, Time, Situation, Place  Normal Continent Weight: 211 lb 6.4 oz (95.9 kg) Height:  5\' 6"  (167.6 cm)  BEHAVIORAL SYMPTOMS/MOOD NEUROLOGICAL BOWEL NUTRITION STATUS   (none)  (none) Continent Diet (regular)  AMBULATORY STATUS COMMUNICATION OF NEEDS Skin   Independent Verbally Normal                       Personal Care Assistance Level of Assistance  Bathing, Dressing, Feeding Bathing Assistance: Independent Feeding assistance: Independent Dressing Assistance: Independent     Functional Limitations Info   (no issues) Sight Info: Adequate Hearing Info: Adequate Speech Info: Adequate    SPECIAL CARE FACTORS FREQUENCY                       Contractures Contractures Info: Not present    Additional Factors  Info  Psychotropic Code Status Info: full             DISCHARGE MEDICATIONS:      Current Discharge Medication List       START taking these medications   Details  amoxicillin-clavulanate (AUGMENTIN) 875-125 MG tablet Take 1 tablet by mouth every 12 (twelve) hours. Qty: 12 tablet, Refills: 0         CONTINUE these medications which have NOT CHANGED   Details  amantadine (SYMMETREL) 100 MG capsule Take 1 capsule (100 mg total) by mouth 2 (two) times daily. Qty: 60 capsule, Refills: 0    amLODipine (NORVASC) 2.5 MG tablet Take 1 tablet (2.5 mg total) by mouth daily. Qty: 30 tablet, Refills: 0    fluPHENAZine (PROLIXIN) 5 MG tablet Take 5-10 mg by mouth See admin instructions. Take 5 mg by mouth in the morning, take 5 mg by mouth in the afternoon and take 10 mg by mouth at bedtime.    levothyroxine (SYNTHROID, LEVOTHROID) 75 MCG tablet Take 1 tablet (75 mcg total) by mouth daily before breakfast. Qty: 30 tablet, Refills: 0    Lurasidone HCl (LATUDA) 120 MG TABS Take 120 mg by mouth every evening.    OXcarbazepine (TRILEPTAL) 150 MG tablet Take 1 tablet (150 mg total) by mouth 2 (two) times daily. Qty: 60 tablet, Refills: 0    traZODone (DESYREL) 50 MG tablet Take 50 mg by  mouth at bedtime.    venlafaxine XR (EFFEXOR-XR) 150 MG 24 hr capsule Take 1 capsule (150 mg total) by mouth daily with breakfast. Qty: 30 capsule, Refills: 0          Relevant Imaging Results:  Relevant Lab Results:   Additional Information    Shela Leff, LCSW

## 2016-05-15 LAB — AEROBIC CULTURE  (SUPERFICIAL SPECIMEN)

## 2016-05-15 LAB — AEROBIC CULTURE W GRAM STAIN (SUPERFICIAL SPECIMEN)

## 2016-06-12 ENCOUNTER — Emergency Department
Admission: EM | Admit: 2016-06-12 | Discharge: 2016-06-13 | Disposition: A | Discharge status: Home / Self Care | Payer: Medicaid Other | Attending: Student in an Organized Health Care Education/Training Program | Admitting: Student in an Organized Health Care Education/Training Program

## 2016-06-12 ENCOUNTER — Encounter: Payer: Self-pay | Admitting: Emergency Medicine

## 2016-06-12 DIAGNOSIS — I10 Essential (primary) hypertension: Secondary | ICD-10-CM | POA: Diagnosis not present

## 2016-06-12 DIAGNOSIS — Z79899 Other long term (current) drug therapy: Secondary | ICD-10-CM | POA: Diagnosis not present

## 2016-06-12 DIAGNOSIS — Z853 Personal history of malignant neoplasm of breast: Secondary | ICD-10-CM | POA: Diagnosis not present

## 2016-06-12 DIAGNOSIS — Y939 Activity, unspecified: Secondary | ICD-10-CM | POA: Diagnosis not present

## 2016-06-12 DIAGNOSIS — E039 Hypothyroidism, unspecified: Secondary | ICD-10-CM | POA: Diagnosis not present

## 2016-06-12 DIAGNOSIS — Y929 Unspecified place or not applicable: Secondary | ICD-10-CM | POA: Diagnosis not present

## 2016-06-12 DIAGNOSIS — T189XXA Foreign body of alimentary tract, part unspecified, initial encounter: Secondary | ICD-10-CM | POA: Diagnosis not present

## 2016-06-12 DIAGNOSIS — F17210 Nicotine dependence, cigarettes, uncomplicated: Secondary | ICD-10-CM | POA: Diagnosis not present

## 2016-06-12 DIAGNOSIS — Y999 Unspecified external cause status: Secondary | ICD-10-CM | POA: Diagnosis not present

## 2016-06-12 DIAGNOSIS — T18198D Other foreign object in esophagus causing other injury, subsequent encounter: Secondary | ICD-10-CM | POA: Diagnosis not present

## 2016-06-12 DIAGNOSIS — J45909 Unspecified asthma, uncomplicated: Secondary | ICD-10-CM | POA: Diagnosis not present

## 2016-06-12 DIAGNOSIS — R1084 Generalized abdominal pain: Secondary | ICD-10-CM

## 2016-06-12 DIAGNOSIS — K625 Hemorrhage of anus and rectum: Secondary | ICD-10-CM

## 2016-06-12 DIAGNOSIS — X58XXXA Exposure to other specified factors, initial encounter: Secondary | ICD-10-CM | POA: Diagnosis not present

## 2016-06-12 LAB — CBC
HEMATOCRIT: 39.6 % (ref 35.0–47.0)
Hemoglobin: 13.4 g/dL (ref 12.0–16.0)
MCH: 30.8 pg (ref 26.0–34.0)
MCHC: 33.7 g/dL (ref 32.0–36.0)
MCV: 91.3 fL (ref 80.0–100.0)
Platelets: 208 10*3/uL (ref 150–440)
RBC: 4.34 MIL/uL (ref 3.80–5.20)
RDW: 13.3 % (ref 11.5–14.5)
WBC: 8.3 10*3/uL (ref 3.6–11.0)

## 2016-06-12 LAB — COMPREHENSIVE METABOLIC PANEL
ALBUMIN: 4.3 g/dL (ref 3.5–5.0)
ALK PHOS: 81 U/L (ref 38–126)
ALT: 11 U/L — ABNORMAL LOW (ref 14–54)
ANION GAP: 6 (ref 5–15)
AST: 23 U/L (ref 15–41)
BILIRUBIN TOTAL: 0.3 mg/dL (ref 0.3–1.2)
BUN: 9 mg/dL (ref 6–20)
CALCIUM: 9.3 mg/dL (ref 8.9–10.3)
CO2: 24 mmol/L (ref 22–32)
Chloride: 107 mmol/L (ref 101–111)
Creatinine, Ser: 0.94 mg/dL (ref 0.44–1.00)
GFR calc Af Amer: >60 mL/min (ref >60)
GFR calc non Af Amer: >60 mL/min (ref >60)
GLUCOSE: 94 mg/dL (ref 65–99)
Potassium: 3.4 mmol/L — ABNORMAL LOW (ref 3.5–5.1)
SODIUM: 137 mmol/L (ref 135–145)
TOTAL PROTEIN: 7.4 g/dL (ref 6.5–8.1)

## 2016-06-12 LAB — TYPE AND SCREEN
ABO/RH(D): B POS
Antibody Screen: NEGATIVE

## 2016-06-12 NOTE — ED Triage Notes (Signed)
Patient comes into the ED via EMS from [facility] c/o rectal bleeding.  Patient states she has had black tarry stools for about a week.  Patient states that today the blood has now turned red.  Patient explains that she has had abdominal pain and diarrhea for a week.  Patient has no shortness of breath, or chest pain.  Patient in NAD at this time and able to ambulate well into the triage room.

## 2016-06-12 NOTE — ED Notes (Signed)
Pt waiting patiently for treatment room; ambulatory with steady gait;

## 2016-06-13 ENCOUNTER — Emergency Department: Payer: Medicaid Other

## 2016-06-13 ENCOUNTER — Emergency Department (EMERGENCY_DEPARTMENT_HOSPITAL)
Admission: EM | Admit: 2016-06-13 | Discharge: 2016-06-14 | Disposition: A | Discharge status: Home / Self Care | Payer: Medicaid Other | Source: Home / Self Care | Attending: Student in an Organized Health Care Education/Training Program | Admitting: Student in an Organized Health Care Education/Training Program

## 2016-06-13 ENCOUNTER — Emergency Department: Payer: Medicaid Other | Admitting: Anesthesiology

## 2016-06-13 ENCOUNTER — Encounter
Admission: EM | Disposition: A | Discharge status: Home / Self Care | Payer: Self-pay | Source: Home / Self Care | Attending: Student in an Organized Health Care Education/Training Program

## 2016-06-13 DIAGNOSIS — Z79899 Other long term (current) drug therapy: Secondary | ICD-10-CM | POA: Insufficient documentation

## 2016-06-13 DIAGNOSIS — Y999 Unspecified external cause status: Secondary | ICD-10-CM | POA: Insufficient documentation

## 2016-06-13 DIAGNOSIS — T189XXA Foreign body of alimentary tract, part unspecified, initial encounter: Secondary | ICD-10-CM

## 2016-06-13 DIAGNOSIS — F17210 Nicotine dependence, cigarettes, uncomplicated: Secondary | ICD-10-CM | POA: Insufficient documentation

## 2016-06-13 DIAGNOSIS — Y929 Unspecified place or not applicable: Secondary | ICD-10-CM | POA: Insufficient documentation

## 2016-06-13 DIAGNOSIS — I10 Essential (primary) hypertension: Secondary | ICD-10-CM | POA: Insufficient documentation

## 2016-06-13 DIAGNOSIS — X58XXXA Exposure to other specified factors, initial encounter: Secondary | ICD-10-CM | POA: Insufficient documentation

## 2016-06-13 DIAGNOSIS — T18198D Other foreign object in esophagus causing other injury, subsequent encounter: Secondary | ICD-10-CM

## 2016-06-13 DIAGNOSIS — Y939 Activity, unspecified: Secondary | ICD-10-CM | POA: Insufficient documentation

## 2016-06-13 DIAGNOSIS — E039 Hypothyroidism, unspecified: Secondary | ICD-10-CM | POA: Insufficient documentation

## 2016-06-13 DIAGNOSIS — Z853 Personal history of malignant neoplasm of breast: Secondary | ICD-10-CM | POA: Insufficient documentation

## 2016-06-13 DIAGNOSIS — J45909 Unspecified asthma, uncomplicated: Secondary | ICD-10-CM | POA: Insufficient documentation

## 2016-06-13 HISTORY — PX: ESOPHAGOGASTRODUODENOSCOPY: SHX5428

## 2016-06-13 LAB — BASIC METABOLIC PANEL
Anion gap: 5 (ref 5–15)
BUN: 8 mg/dL (ref 6–20)
CALCIUM: 9.7 mg/dL (ref 8.9–10.3)
CO2: 27 mmol/L (ref 22–32)
Chloride: 107 mmol/L (ref 101–111)
Creatinine, Ser: 0.77 mg/dL (ref 0.44–1.00)
GFR calc Af Amer: >60 mL/min (ref >60)
Glucose, Bld: 90 mg/dL (ref 65–99)
POTASSIUM: 3.9 mmol/L (ref 3.5–5.1)
SODIUM: 139 mmol/L (ref 135–145)

## 2016-06-13 LAB — LIPASE, BLOOD: LIPASE: 34 U/L (ref 11–51)

## 2016-06-13 LAB — ACETAMINOPHEN LEVEL: Acetaminophen (Tylenol), Serum: <10 ug/mL — ABNORMAL LOW (ref 10–30)

## 2016-06-13 LAB — POCT PREGNANCY, URINE: PREG TEST UR: NEGATIVE

## 2016-06-13 IMAGING — CR DG ABDOMEN 1V
2 series · 2 of 2 positions shown · non-contrast
Comparison: [DATE]

CLINICAL DATA: Foreign body ingestion, schizophrenia

EXAM:
ABDOMEN - 1 VIEW

[abdomen kub (1 of 2)]
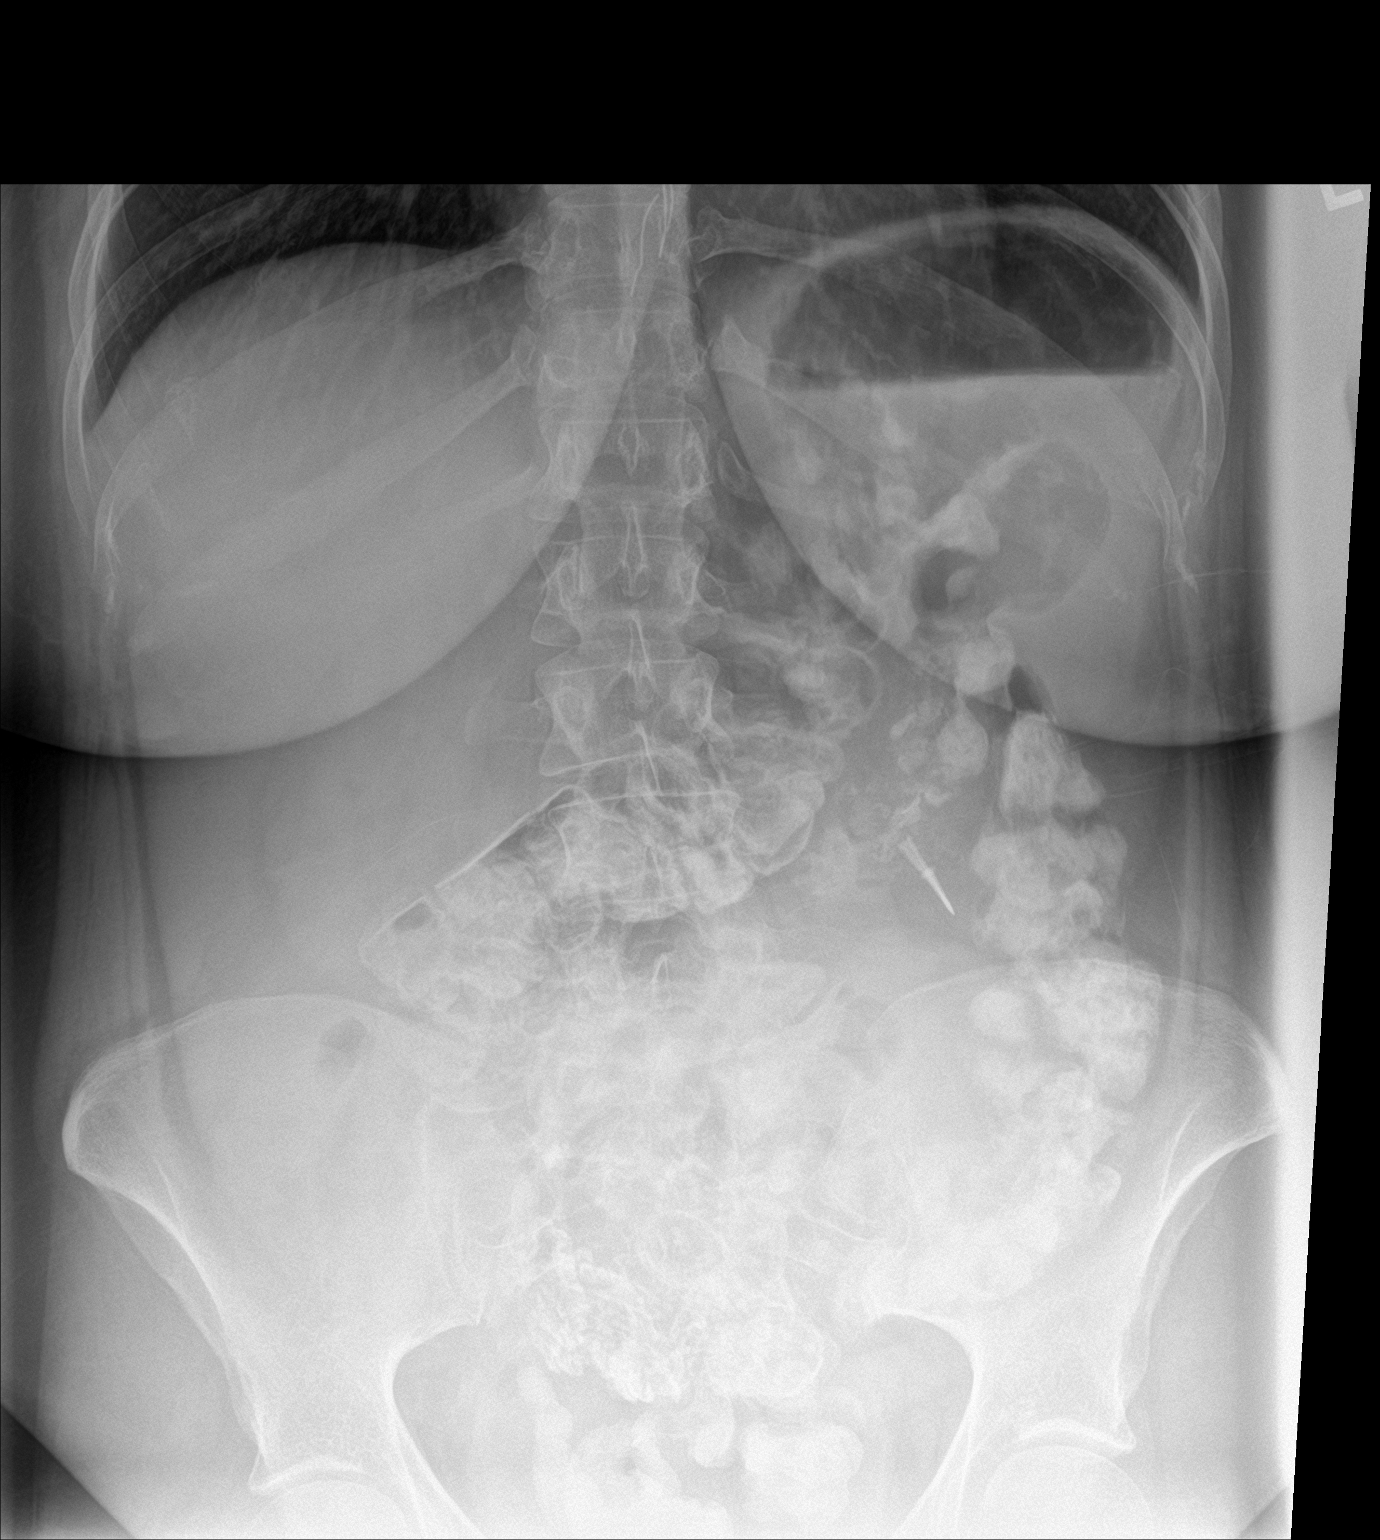

[abdomen kub (2 of 2)]
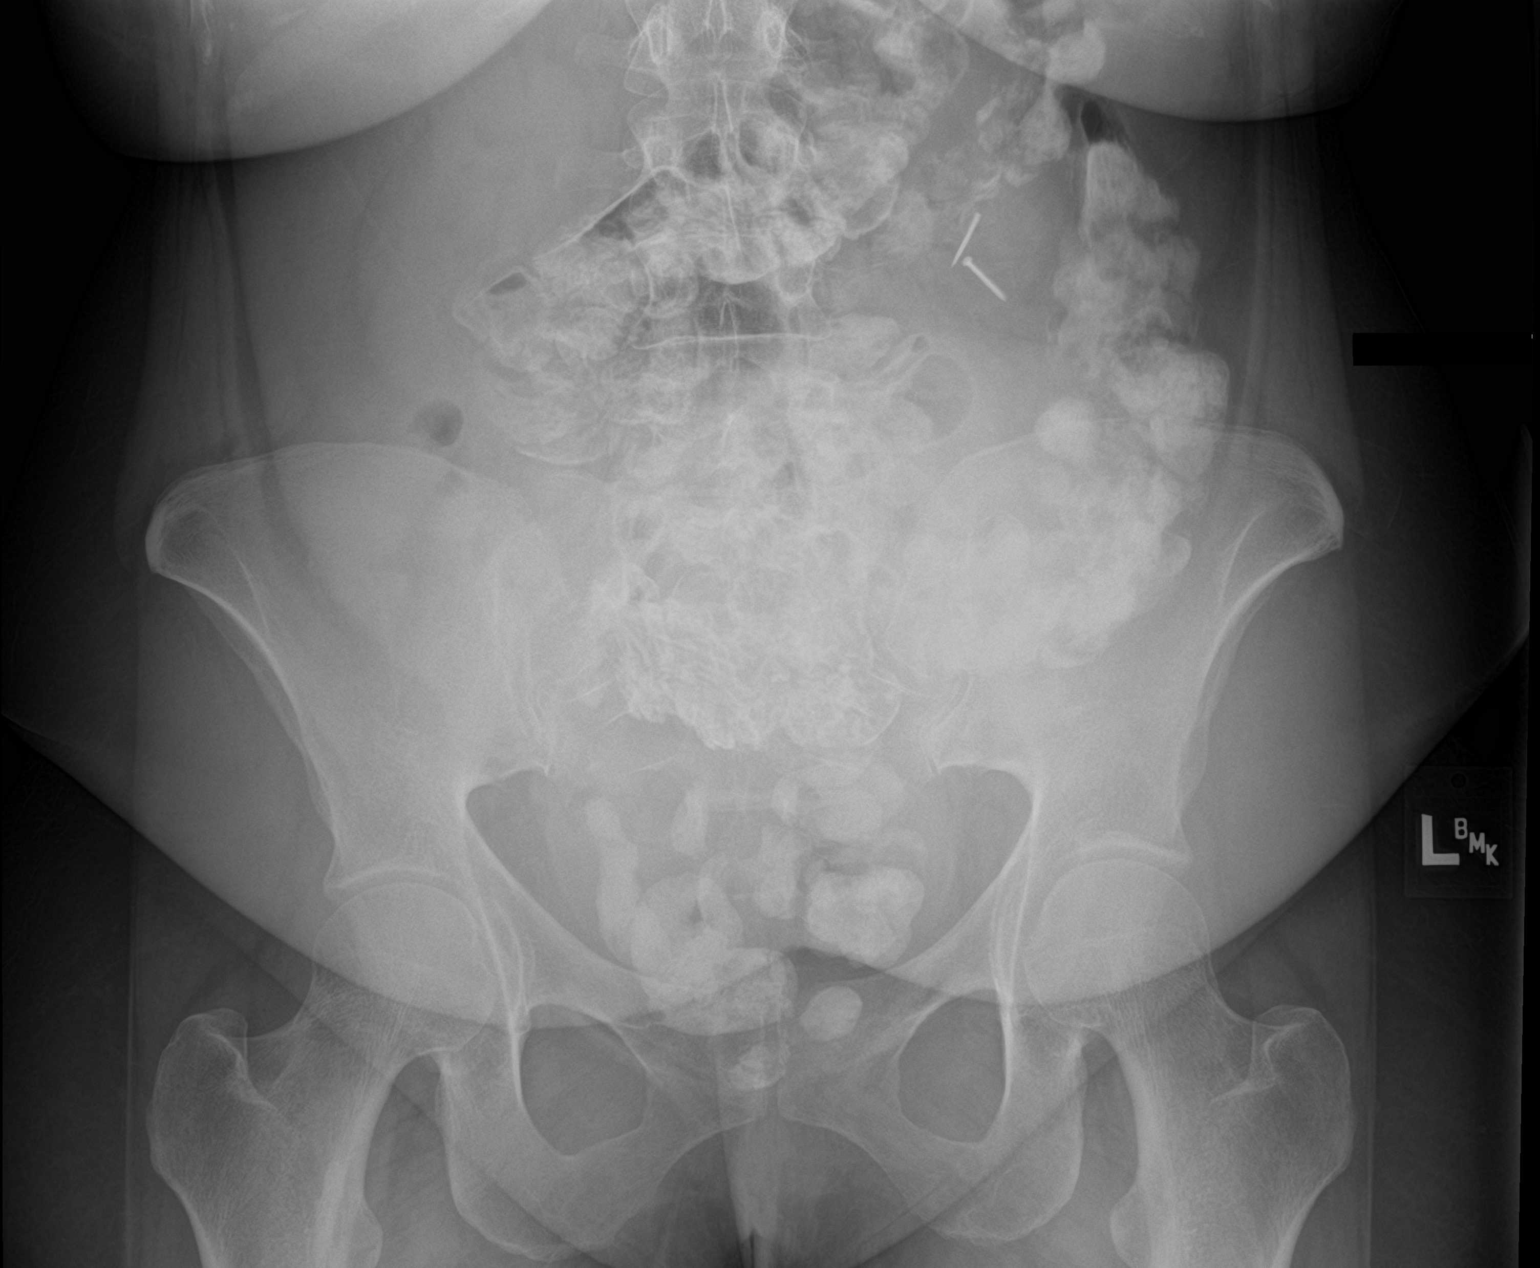

[2 of 2 positions shown; findings below may reference images not displayed]

FINDINGS: Two Small linear radiopaque foreign bodies in the left abdomen
compatible with ingested thumb tacks. These likely or either within
the stomach or proximal small bowel. Oral contrast throughout the
colon. No obstruction pattern. Lung bases are clear. No free air.
IMPRESSION: Two small linear radiopaque foreign bodies in the left abdomen
compatible with ingested foreign bodies (by report patient ingested
thumb tacks).

No associated obstruction or free air.

## 2016-06-13 IMAGING — CT CT ABD-PELV W/O CM
2 of 4 series · 16 of 46 positions shown, 18 images · non-contrast
Comparison: [DATE]

CLINICAL DATA: Melena for 1 week.  Nausea and vomiting.

EXAM:
CT ABDOMEN AND PELVIS WITHOUT CONTRAST
TECHNIQUE: Multidetector CT imaging of the abdomen and pelvis was performed
following the standard protocol without IV contrast.

[Series 2: routine abd/pel wo · axial · 0.78mm/px · z∈[-511,-66]mm · 13 of 99 slices shown, 15 images]
[im 5/99  soft-tissue]
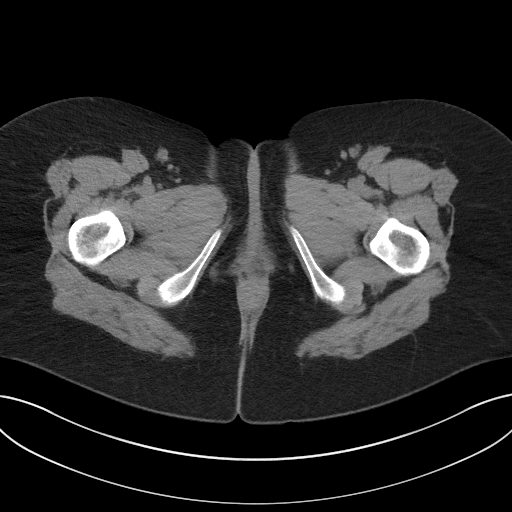
[im 5/99  bone]
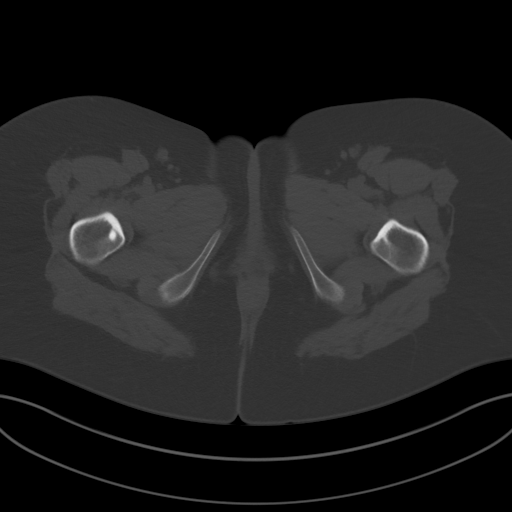
[im 13/99  soft-tissue]
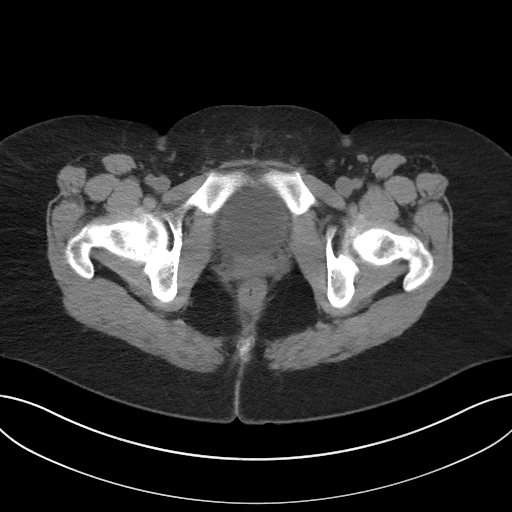
[im 21/99  soft-tissue]
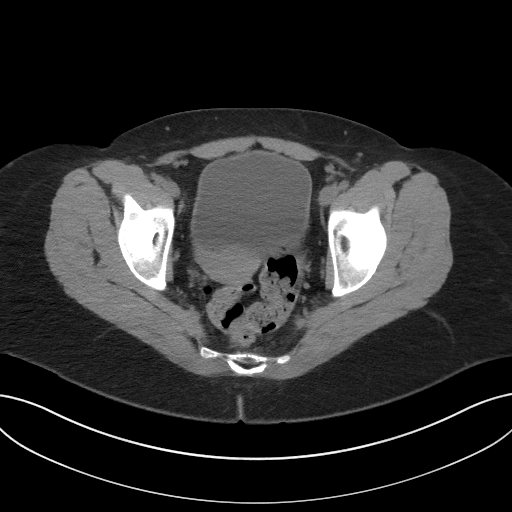
[im 29/99  soft-tissue]
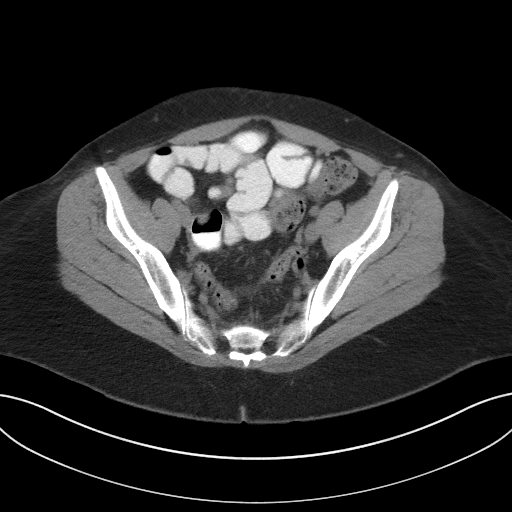
[im 33/99  soft-tissue]
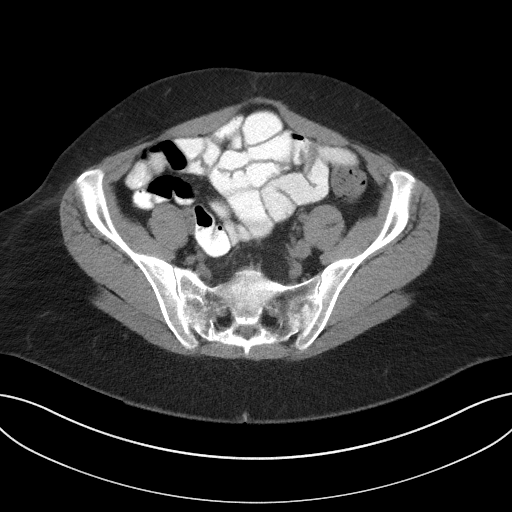
[im 41/99  soft-tissue]
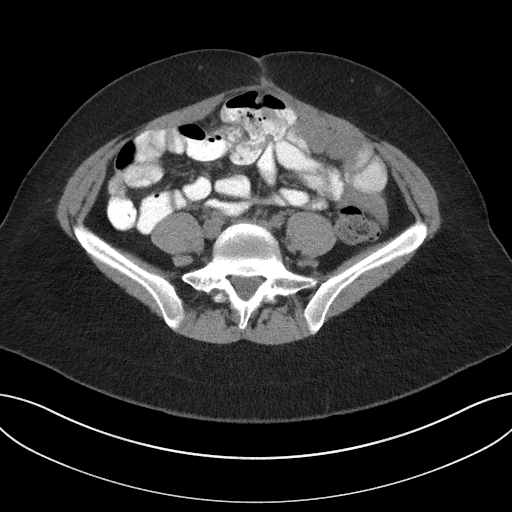
[im 50/99  soft-tissue]
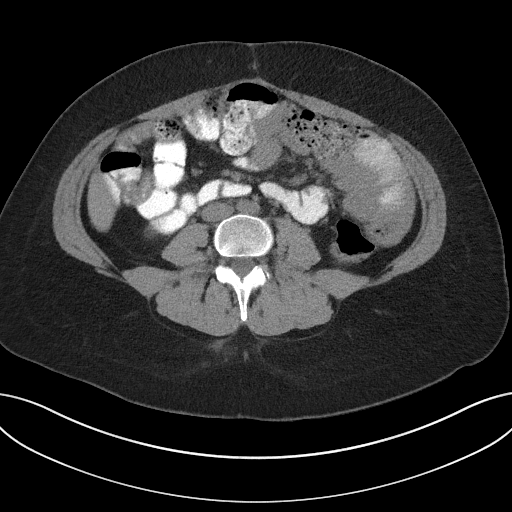
[im 58/99  soft-tissue]
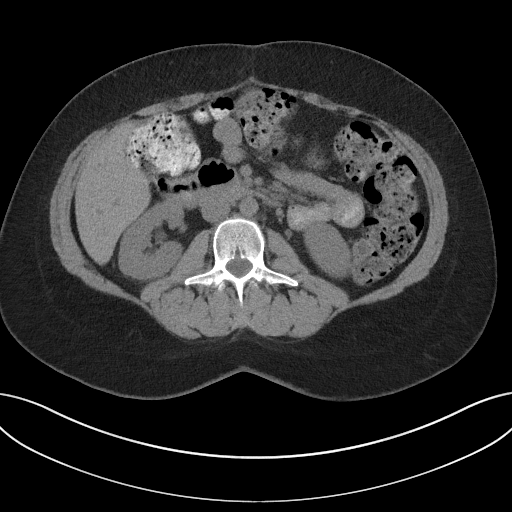
[im 66/99  soft-tissue]
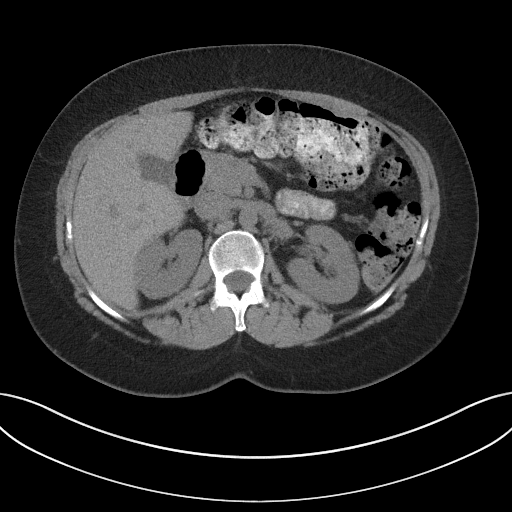
[im 66/99  bone]
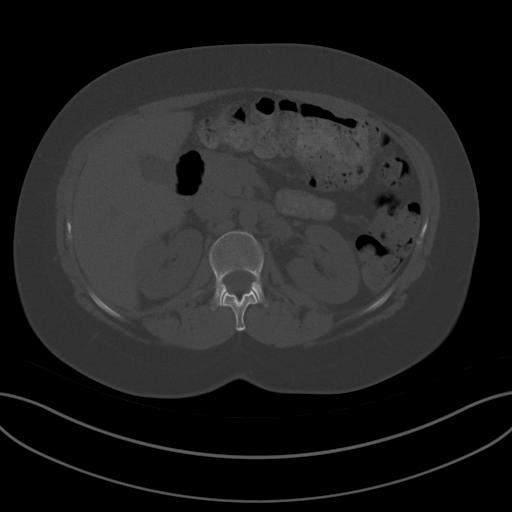
[im 70/99  soft-tissue]
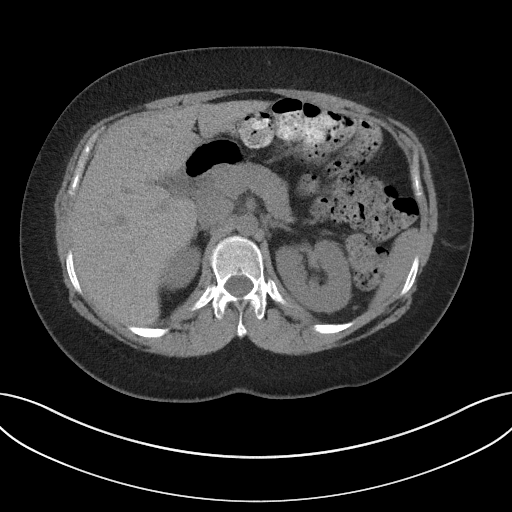
[im 78/99  soft-tissue]
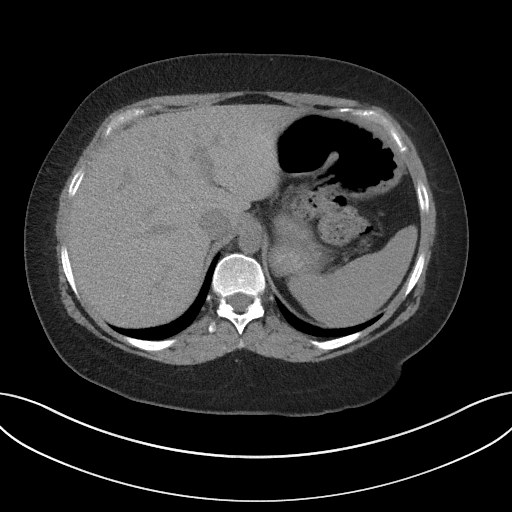
[im 86/99  soft-tissue]
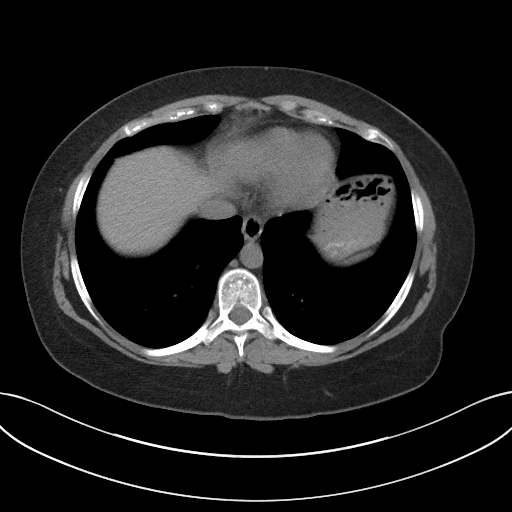
[im 94/99  soft-tissue]
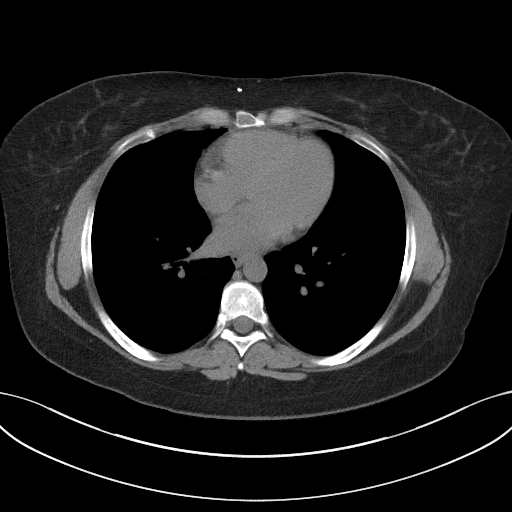

[Series 6: coronal st · coronal · 0.75mm/px · 3 of 97 slices shown]
[im 33/97  soft-tissue]
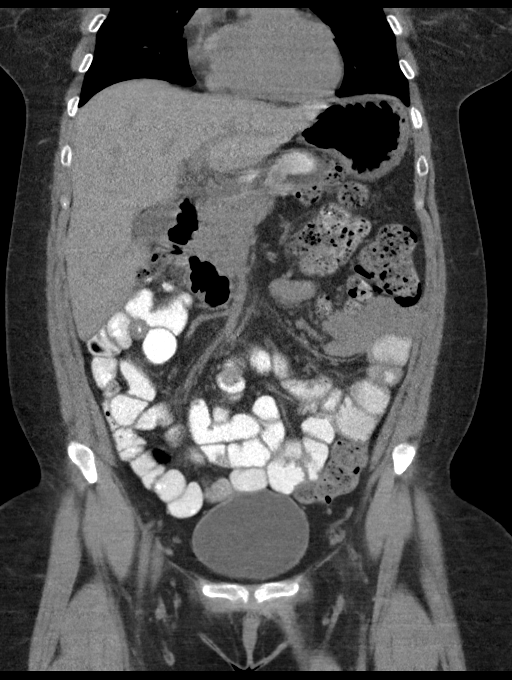
[im 43/97  soft-tissue]
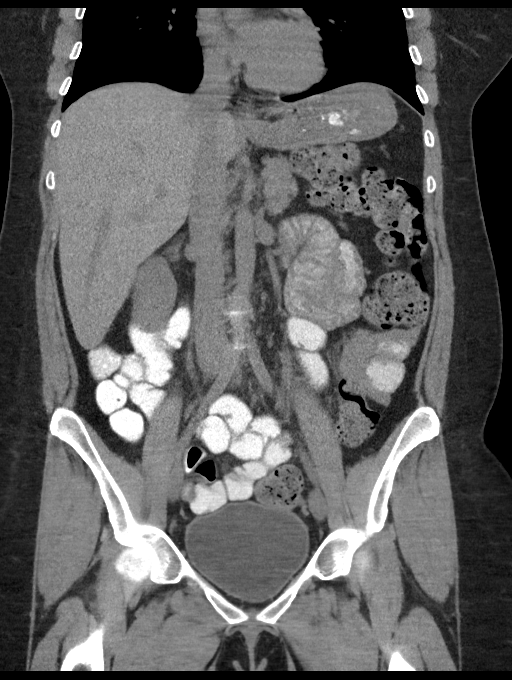
[im 54/97  soft-tissue]
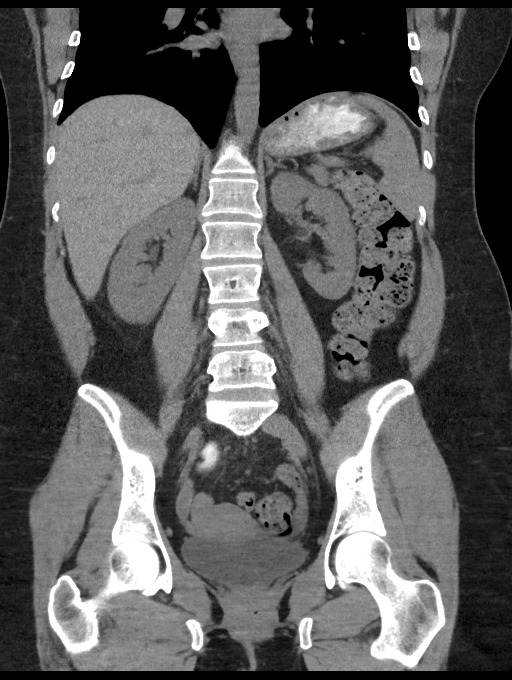

[16 of 46 positions shown; findings below may reference images not displayed]

FINDINGS: Lower chest: No significant abnormality

Hepatobiliary: No focal liver abnormality is seen. No gallstones,
gallbladder wall thickening, or biliary dilatation.

Pancreas: Unremarkable. No pancreatic ductal dilatation or
surrounding inflammatory changes.

Spleen: Normal in size without focal abnormality.

Adrenals/Urinary Tract: Adrenal glands are unremarkable. Kidneys are
normal, without renal calculi, focal lesion, or hydronephrosis.
Bladder is unremarkable.

Stomach/Bowel: Stomach is within normal limits. Colon appears
normal. No evidence of bowel wall thickening, distention, or
inflammatory changes. No enteric foreign body is evident.

Vascular/Lymphatic: No significant vascular findings are present. No
enlarged abdominal or pelvic lymph nodes.

Reproductive: Uterus and bilateral adnexa are unremarkable.

Other: No acute inflammation.  No ascites.

Musculoskeletal: No significant skeletal lesion.
IMPRESSION: No significant abnormality.

## 2016-06-13 SURGERY — EGD (ESOPHAGOGASTRODUODENOSCOPY)
Anesthesia: General | Wound class: Contaminated

## 2016-06-13 MED ORDER — GI COCKTAIL ~~LOC~~
30.0000 mL | Freq: Once | ORAL | Status: AC
  Administered 2016-06-13: 30 mL via ORAL
  Filled 2016-06-13: qty 30

## 2016-06-13 MED ORDER — SUCCINYLCHOLINE CHLORIDE 200 MG/10ML IV SOSY
PREFILLED_SYRINGE | INTRAVENOUS | Status: AC
  Filled 2016-06-13: qty 10

## 2016-06-13 MED ORDER — PROPOFOL 10 MG/ML IV BOLUS
INTRAVENOUS | Status: AC
  Filled 2016-06-13: qty 20

## 2016-06-13 MED ORDER — SUCCINYLCHOLINE CHLORIDE 20 MG/ML IJ SOLN
INTRAMUSCULAR | Status: DC | PRN
  Administered 2016-06-13: 100 mg via INTRAVENOUS

## 2016-06-13 MED ORDER — PROPOFOL 10 MG/ML IV BOLUS
INTRAVENOUS | Status: DC | PRN
  Administered 2016-06-13: 30 mg via INTRAVENOUS
  Administered 2016-06-13: 200 mg via INTRAVENOUS

## 2016-06-13 MED ORDER — FENTANYL CITRATE (PF) 100 MCG/2ML IJ SOLN
INTRAMUSCULAR | Status: DC | PRN
  Administered 2016-06-13 (×2): 50 ug via INTRAVENOUS
  Administered 2016-06-13: 25 ug via INTRAVENOUS

## 2016-06-13 MED ORDER — MIDAZOLAM HCL 2 MG/2ML IJ SOLN
INTRAMUSCULAR | Status: AC
  Filled 2016-06-13: qty 2

## 2016-06-13 MED ORDER — MIDAZOLAM HCL 2 MG/2ML IJ SOLN
INTRAMUSCULAR | Status: DC | PRN
  Administered 2016-06-13: 2 mg via INTRAVENOUS

## 2016-06-13 MED ORDER — BARIUM SULFATE 2.1 % PO SUSP
450.0000 mL | ORAL | Status: AC
  Administered 2016-06-13: 450 mL via ORAL

## 2016-06-13 MED ORDER — LIDOCAINE HCL (PF) 2 % IJ SOLN
INTRAMUSCULAR | Status: AC
  Filled 2016-06-13: qty 2

## 2016-06-13 MED ORDER — LIDOCAINE HCL (CARDIAC) 20 MG/ML IV SOLN
INTRAVENOUS | Status: DC | PRN
  Administered 2016-06-13: 80 mg via INTRAVENOUS

## 2016-06-13 MED ORDER — LACTATED RINGERS IV SOLN
INTRAVENOUS | Status: DC | PRN
  Administered 2016-06-13: 21:00:00 via INTRAVENOUS

## 2016-06-13 MED ORDER — FENTANYL CITRATE (PF) 100 MCG/2ML IJ SOLN
INTRAMUSCULAR | Status: AC
  Filled 2016-06-13: qty 2

## 2016-06-13 NOTE — Op Note (Addendum)
[facility] Patient [name] Procedure Date: 06/13/2016 8:35 PM MRN: [id number] Account #: [id number] Date of Birth: [birth year] Admit Type: Outpatient Age: 26 Room: [facility] ENDO ROOM 3 Gender: Female Note Status: Finalized Procedure:            Upper GI endoscopy Indications:          Foreign body in the esophagus Providers:            [name] MD, MD Medicines:            General Anesthesia Complications:        No immediate complications. Procedure:            Pre-Anesthesia Assessment:                       - Prior to the procedure, a History and Physical was                        performed, and patient medications and allergies were                        reviewed. The patient's tolerance of previous                        anesthesia was also reviewed. The risks and benefits of                        the procedure and the sedation options and risks were                        discussed with the patient. All questions were                        answered, and informed consent was obtained. Prior                        Anticoagulants: The patient has taken no previous                        anticoagulant or antiplatelet agents. ASA Grade                        Assessment: II - A patient with mild systemic disease.                        After reviewing the risks and benefits, the patient was                        deemed in satisfactory condition to undergo the                        procedure.                       After obtaining informed consent, the endoscope was                        passed under direct vision. Throughout the procedure,                        the patient's blood  pressure, pulse, and oxygen                        saturations were monitored continuously. The Endoscope                        was introduced through the mouth, and advanced to the                        fourth part of duodenum. The upper GI  endoscopy was                        accomplished without difficulty. The patient tolerated                        the procedure well. Findings:      Safety pin was found at the cricopharyngeus. Removal was accomplished       with a hood protector after pushing the pin into the stomach.      The stomach was normal.      The examined duodenum was normal.      Flouro was used to look for other objects and there was one seen in the       RLQ and the other in the RUQ. Both could not be seen with the scope. Impression:           - Safty pin were found in the esophagus. Removal was                        successful.                       - Normal stomach.                       - Normal examined duodenum. Recommendation:       - Return patient to ER for ongoing care. Procedure Code(s):    --- Professional ---                       [phone], Esophagogastroduodenoscopy, flexible, transoral;                        with removal of foreign body(s) Diagnosis Code(s):    --- Professional ---                       [phone], Other foreign object in esophagus causing                        other injury, initial encounter CPT copyright 2016 [employer]. All rights reserved. The codes documented in this report are preliminary and upon coder review may  be revised to meet current compliance requirements. [name] MD, MD 06/13/2016 9:32:42 PM This report has been signed electronically. Number of Addenda: 0 Note Initiated On: 06/13/2016 8:35 PM      [facility]

## 2016-06-13 NOTE — Anesthesia Postprocedure Evaluation (Signed)
Anesthesia Post Note  Patient: Melanie Bell  Procedure(s) Performed: Procedure(s) (LRB): ESOPHAGOGASTRODUODENOSCOPY (EGD) (N/A)  Patient location during evaluation: PACU Anesthesia Type: General Level of consciousness: awake and alert and oriented Pain management: pain level controlled Vital Signs Assessment: post-procedure vital signs reviewed and stable Respiratory status: spontaneous breathing, nonlabored ventilation and respiratory function stable Cardiovascular status: blood pressure returned to baseline and stable Postop Assessment: no signs of nausea or vomiting Anesthetic complications: no     Last Vitals:  Vitals:   06/13/16 2150 06/13/16 2203  BP: 139/80 (!) 142/82  Pulse: 97 96  Resp: (!) 34 (!) 26  Temp:  36.6 C    Last Pain:  Vitals:   06/13/16 2203  TempSrc:   PainSc: 0-No pain                 Amberly Livas

## 2016-06-13 NOTE — Transfer of Care (Signed)
Immediate Anesthesia Transfer of Care Note  Patient: Melanie Bell  Procedure(s) Performed: Procedure(s): ESOPHAGOGASTRODUODENOSCOPY (EGD) (N/A)  Patient Location: PACU  Anesthesia Type:General  Level of Consciousness: awake, alert  and oriented  Airway & Oxygen Therapy: Patient Spontanous Breathing and Patient connected to face mask oxygen  Post-op Assessment: Report given to RN and Post -op Vital signs reviewed and stable  Post vital signs: Reviewed and stable  Last Vitals:  Vitals:   06/13/16 1909 06/13/16 2135  BP: 127/83 139/85  Pulse: (!) 103 (!) 109  Resp: 18 18  Temp:  36.4 C    Last Pain:  Vitals:   06/13/16 1833  TempSrc:   PainSc: 5          Complications: No apparent anesthesia complications

## 2016-06-13 NOTE — ED Notes (Signed)
Pt states dark and tarry stools that started yesterday morning. Pt states also has bright red rectal bleeding. Pt states before rectal bleeding started she had pain across bilateral lower quadrants and an episode of emesis. Pt denies pain currently, denies nausea currently. Skin pwd. resps unlabored. Pt states last episode of "bloody diarrhea" in waiting room.

## 2016-06-13 NOTE — ED Notes (Signed)
Pt has finished barium. Pt on I pad watching movies in no acute distress. Pt denies pain at present. Vss. Call bell at right side.

## 2016-06-13 NOTE — ED Notes (Signed)
Blood drawn at time of IV insertion. Red and green top sent to lab at this time.

## 2016-06-13 NOTE — Anesthesia Post-op Follow-up Note (Cosign Needed)
Anesthesia QCDR form completed.        

## 2016-06-13 NOTE — Progress Notes (Signed)
[place] assessment is showing as being completed at 4:42 a.m., documentation was actually done at 22:00.

## 2016-06-13 NOTE — ED Provider Notes (Signed)
[facility] Emergency Department Provider Note   ____________________________________________   First MD Initiated Contact with Patient 06/13/16 [id number]     (approximate)  I have reviewed the triage vital signs and the nursing notes.   HISTORY  Chief Complaint Rectal Bleeding and Abdominal Pain    HPI Melanie Bell is a 26 y.o. female who comes into the hospital today with some rectal bleeding and loose tarry stools. She reports that she vomited once. She states that the rectal bleeding started today. She's had the dark stool which is been loose for the past week. The patient reports she is been going 4-5 times a day with these dark stools denies any new medications aside from what she normally takes. The patient also reports that she has some abdominal pain. She states that it lower abdominal pain but she is pointing to her upper abdomen into her mid abdomen. The patient rates her pain a 10 out of 10 in intensity. She reports that she had some bright red blood in the toilet as well. She has felt dizzy and lightheaded. She denies any chest pain or shortness of breath. The patient is here today for evaluation.   Past Medical History:  Diagnosis Date  . Anxiety   . Asthma   . Depression   . GERD (gastroesophageal reflux disease)   . Hallucinations 09/30/2014   Sizoaffective  . Hyperlipidemia   . Hypertension   . Intentional ingestion of batteries 04/21/2016    2 AAA batteries and 1 thumb tack; intent to hurt herself/notes 04/21/2016  . Tardive dyskinesia 10/2014   recent onset    Patient Active Problem List   Diagnosis Date Noted  . Malingering 05/06/2016  . Foreign body aspiration   . FB GI (foreign body in gastrointestinal tract)   . Swallowed foreign body 04/23/2016  . Foreign body ingestion 04/21/2016  . Gastric foreign body   . Breast tumor 03/05/2016  . Schizoaffective disorder, bipolar type ([place]) 11/06/2014  . Tobacco use disorder  09/30/2014  . Hypothyroidism 09/29/2014  . Hypertension 09/29/2014    Past Surgical History:  Procedure Laterality Date  . ABDOMINAL SURGERY     "years ago" to remove foreign objects  . APPENDECTOMY    . BREAST LUMPECTOMY Right   . COLONOSCOPY WITH PROPOFOL N/A 09/10/2015   Procedure: COLONOSCOPY WITH PROPOFOL;  Surgeon: [name], MD;  Location: [facility] ENDOSCOPY;  Service: Endoscopy;  Laterality: N/A;  . ESOPHAGOGASTRODUODENOSCOPY N/A 11/28/2014   Procedure: ESOPHAGOGASTRODUODENOSCOPY (EGD);  Surgeon: [name], MD;  Location: [facility] ENDOSCOPY;  Service: Endoscopy;  Laterality: N/A;  . ESOPHAGOGASTRODUODENOSCOPY N/A 02/21/2016   Procedure: ESOPHAGOGASTRODUODENOSCOPY (EGD);  Surgeon: Mauri Pole, MD;  Location: [facility] ENDOSCOPY;  Service: Endoscopy;  Laterality: N/A;  . ESOPHAGOGASTRODUODENOSCOPY N/A 04/21/2016   Procedure: ESOPHAGOGASTRODUODENOSCOPY (EGD);  Surgeon: Gatha Mayer, MD;  Location: [facility] ENDOSCOPY;  Service: Endoscopy;  Laterality: N/A;  . ESOPHAGOGASTRODUODENOSCOPY N/A 05/06/2016   Procedure: ESOPHAGOGASTRODUODENOSCOPY (EGD);  Surgeon: Jonathon Bellows, MD;  Location: [facility] ENDOSCOPY;  Service: Gastroenterology;  Laterality: N/A;  . ESOPHAGOGASTRODUODENOSCOPY (EGD) WITH PROPOFOL N/A 02/29/2016   Procedure: ESOPHAGOGASTRODUODENOSCOPY (EGD) WITH PROPOFOL;  Surgeon: [name], MD;  Location: [facility] ENDOSCOPY;  Service: Endoscopy;  Laterality: N/A;  . LAPAROTOMY N/A 09/12/2015   Procedure: EXPLORATORY LAPAROTOMY;  Surgeon: Florene Glen, MD;  Location: [facility] ORS;  Service: General;  Laterality: N/A;  . SIGMOIDOSCOPY N/A 09/12/2015   Procedure: Lonell Face;  Surgeon: Florene Glen, MD;  Location: [facility] ORS;  Service:  General;  Laterality: N/A;  . WISDOM TOOTH EXTRACTION      Prior to Admission medications   Medication Sig Start Date End Date Taking? Authorizing Provider  amantadine (SYMMETREL) 100 MG capsule Take 1 capsule (100 mg total) by mouth 2 (two) times daily.  03/07/16  Yes [name], MD  amLODipine (NORVASC) 2.5 MG tablet Take 1 tablet (2.5 mg total) by mouth daily. 06/19/15  Yes [name] [name], MD  fluPHENAZine (PROLIXIN) 5 MG tablet Take 5-10 mg by mouth See admin instructions. Take 5 mg by mouth in the morning, take 5 mg by mouth in the afternoon and take 10 mg by mouth at bedtime.   Yes Historical Provider, MD  levothyroxine (SYNTHROID, LEVOTHROID) 75 MCG tablet Take 1 tablet (75 mcg total) by mouth daily before breakfast. 06/19/15  Yes [name] [name], MD  Lurasidone HCl (LATUDA) 120 MG TABS Take 120 mg by mouth every evening.   Yes Historical Provider, MD  OXcarbazepine (TRILEPTAL) 150 MG tablet Take 1 tablet (150 mg total) by mouth 2 (two) times daily. 06/19/15  Yes [name] [name], MD  traZODone (DESYREL) 50 MG tablet Take 50 mg by mouth at bedtime.   Yes Historical Provider, MD  venlafaxine XR (EFFEXOR-XR) 150 MG 24 hr capsule Take 1 capsule (150 mg total) by mouth daily with breakfast. 06/19/15  Yes [name] [name], MD  amoxicillin-clavulanate (AUGMENTIN) 875-125 MG tablet Take 1 tablet by mouth every 12 (twelve) hours. Patient not taking: Reported on 06/13/2016 05/13/16   Fritzi Mandes, MD    Allergies Betadine [povidone iodine]; Iodine; and Shellfish-derived products  Family History  Problem Relation Age of Onset  . Depression Mother   . Hypertension Mother   . Sleep apnea Mother   . Asthma Mother   . COPD Mother   . Diabetes Mother     Social History Social History  Substance Use Topics  . Smoking status: Current Every Day Smoker    Packs/day: 0.50    Years: 3.00    Types: Cigarettes  . Smokeless tobacco: Never Used  . Alcohol use No    Review of Systems Constitutional: No fever/chills Eyes: No visual changes. ENT: No sore throat. Cardiovascular: Denies chest pain. Respiratory: Denies shortness of breath. Gastrointestinal: No abdominal pain.  No nausea, no vomiting.  No diarrhea.  No  constipation. Genitourinary: dark stools with bright red blood per rectum Musculoskeletal: Negative for back pain. Skin: Negative for rash. Neurological: Negative for headaches, focal weakness or numbness.  10-point ROS otherwise negative.  ____________________________________________   PHYSICAL EXAM:  VITAL SIGNS: ED Triage Vitals  Enc Vitals Group     BP 06/12/16 1835 (!) 159/84     Pulse Rate 06/12/16 1835 (!) 108     Resp 06/12/16 1835 16     Temp 06/12/16 1835 98.6 F (37 C)     Temp Source 06/12/16 1835 Oral     SpO2 06/12/16 1835 97 %     Weight 06/12/16 1836 220 lb (99.8 kg)     Height 06/12/16 1836 5\' 6"  (1.676 m)     Head Circumference --      Peak Flow --      Pain Score 06/12/16 1836 10     Pain Loc --      Pain Edu? --      Excl. in [place]? --     Constitutional: Alert and oriented. Well appearing and in mild distress. Eyes: Conjunctivae are normal. PERRL. EOMI. Head: Atraumatic. Nose: No congestion/rhinnorhea.  Mouth/Throat: Mucous membranes are moist.  Oropharynx non-erythematous. Cardiovascular: Normal rate, regular rhythm. Grossly normal heart sounds.  Good peripheral circulation. Respiratory: Normal respiratory effort.  No retractions. Lungs CTAB. Gastrointestinal: Soft with epigastric tenderness to palpation. No distention. Positive bowel sounds Rectal: patient with small hemorrhoid that is not actively bleeding,no blood on digital rectal exam, yellowish brown appearing stool that is heme-negative on Hemoccult. Musculoskeletal: No lower extremity tenderness nor edema.   Neurologic:  Normal speech and language.  Skin:  Skin is warm, dry and intact.  Psychiatric: Mood and affect are normal.   ____________________________________________   LABS (all labs ordered are listed, but only abnormal results are displayed)  Labs Reviewed  COMPREHENSIVE METABOLIC PANEL - Abnormal; Notable for the following:       Result Value   Potassium 3.4 (*)    ALT 11 (*)     All other components within normal limits  CBC  LIPASE, BLOOD  POC URINE PREG, ED  POCT PREGNANCY, URINE  POC OCCULT BLOOD, ED  TYPE AND SCREEN   ____________________________________________  EKG  none ____________________________________________  RADIOLOGY  CT abd and pelvis ____________________________________________   PROCEDURES  Procedure(s) performed: None  Procedures  Critical Care performed: No  ____________________________________________   INITIAL IMPRESSION / ASSESSMENT AND PLAN / ED COURSE  Pertinent labs & imaging results that were available during my care of the patient were reviewed by me and considered in my medical decision making (see chart for details).  This is a 26 year old female who comes into the hospital today with a week of dark stools as well as some bright red blood per rectum she reports that started today. The patient's rectal does not show any bleeding and her Hemoccult is negative but I will send the patient for CT of her abdomen and pelvis for further evaluation of her abdominal pain.  Clinical Course as of Jun 13 400  Mon Jun 13, 2016  0355 No significant abnormality. CT Abdomen Pelvis Wo Contrast [AW]    Clinical Course User Index [AW] Loney Hering, MD   The patient's CT scan is unremarkable. The patient will be discharged home. She also did not have any blood or dark stool on her rectal exam. The patient should follow-up with her primary care physician.  ____________________________________________   FINAL CLINICAL IMPRESSION(S) / ED DIAGNOSES  Final diagnoses:  Generalized abdominal pain  Rectal bleeding      NEW MEDICATIONS STARTED DURING THIS VISIT:  New Prescriptions   No medications on file     Note:  This document was prepared using Dragon voice recognition software and may include unintentional dictation errors.    Loney Hering, MD 06/13/16 [phone]

## 2016-06-13 NOTE — Anesthesia Preprocedure Evaluation (Signed)
Anesthesia Evaluation  Patient identified by MRN, date of birth, ID band Patient awake    Reviewed: Allergy & Precautions, NPO status , Patient's Chart, lab work & pertinent test results  History of Anesthesia Complications Negative for: history of anesthetic complications  Airway Mallampati: II  TM Distance: >3 FB Neck ROM: Full    Dental no notable dental hx.    Pulmonary asthma (mild intermittent) , Current Smoker,    breath sounds clear to auscultation- rhonchi (-) wheezing      Cardiovascular hypertension, (-) CAD and (-) Past MI  Rhythm:Regular Rate:Normal - Systolic murmurs and - Diastolic murmurs    Neuro/Psych Anxiety Depression Bipolar Disorder Schizophrenia    GI/Hepatic GERD  ,Per report, swallowed several thumb tacks   Endo/Other  Hypothyroidism   Renal/GU      Musculoskeletal negative musculoskeletal ROS (+)   Abdominal (+) + obese,   Peds  Hematology negative hematology ROS (+)   Anesthesia Other Findings Past Medical History: No date: Anxiety No date: Asthma No date: Depression No date: GERD (gastroesophageal reflux disease) 09/30/2014: Hallucinations     Comment: Sizoaffective No date: Hyperlipidemia No date: Hypertension 04/21/2016: Intentional ingestion of batteries     Comment:  2 AAA batteries and 1 thumb tack; intent to               hurt herself/notes 04/21/2016 10/2014: Tardive dyskinesia     Comment: recent onset   Reproductive/Obstetrics                             Anesthesia Physical Anesthesia Plan  ASA: II and emergent  Anesthesia Plan: General   Post-op Pain Management:    Induction: Intravenous, Rapid sequence and Cricoid pressure planned  Airway Management Planned: Oral ETT  Additional Equipment:   Intra-op Plan:   Post-operative Plan: Extubation in OR  Informed Consent: I have reviewed the patients History and Physical, chart, labs and  discussed the procedure including the risks, benefits and alternatives for the proposed anesthesia with the patient or authorized representative who has indicated his/her understanding and acceptance.   Dental advisory given  Plan Discussed with: CRNA and Anesthesiologist  Anesthesia Plan Comments:         Anesthesia Quick Evaluation
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
## 2016-06-13 NOTE — ED Notes (Signed)

## 2016-06-13 NOTE — ED Provider Notes (Signed)
[facility] Emergency Department Provider Note    First MD Initiated Contact with Patient 06/13/16 1918     (approximate)  I have reviewed the triage vital signs and the nursing notes.   HISTORY  Chief Complaint Swallowed Foreign Body    HPI Melanie Bell is a 26 y.o. female well-known to this facility presents after ingesting to thumb tacks and reportedly an open safety pin. Patient states that she did this because the voices were telling her to. Patient with frequent visits for similar episodes of intentional ingestion requiring now at least for endoscopies in 4 months. She denies any chest pain or shortness of breath. Denies any abdominal pain. She currently denies any SI or HI.   Past Medical History:  Diagnosis Date  . Anxiety   . Asthma   . Depression   . GERD (gastroesophageal reflux disease)   . Hallucinations 09/30/2014   Sizoaffective  . Hyperlipidemia   . Hypertension   . Intentional ingestion of batteries 04/21/2016    2 AAA batteries and 1 thumb tack; intent to hurt herself/notes 04/21/2016  . Tardive dyskinesia 10/2014   recent onset   Family History  Problem Relation Age of Onset  . Depression Mother   . Hypertension Mother   . Sleep apnea Mother   . Asthma Mother   . COPD Mother   . Diabetes Mother    Past Surgical History:  Procedure Laterality Date  . ABDOMINAL SURGERY     "years ago" to remove foreign objects  . APPENDECTOMY    . BREAST LUMPECTOMY Right   . COLONOSCOPY WITH PROPOFOL N/A 09/10/2015   Procedure: COLONOSCOPY WITH PROPOFOL;  Surgeon: [name], MD;  Location: [facility] ENDOSCOPY;  Service: Endoscopy;  Laterality: N/A;  . ESOPHAGOGASTRODUODENOSCOPY N/A 11/28/2014   Procedure: ESOPHAGOGASTRODUODENOSCOPY (EGD);  Surgeon: [name], MD;  Location: [facility] ENDOSCOPY;  Service: Endoscopy;  Laterality: N/A;  . ESOPHAGOGASTRODUODENOSCOPY N/A 02/21/2016   Procedure: ESOPHAGOGASTRODUODENOSCOPY (EGD);   Surgeon: Mauri Pole, MD;  Location: [facility] ENDOSCOPY;  Service: Endoscopy;  Laterality: N/A;  . ESOPHAGOGASTRODUODENOSCOPY N/A 04/21/2016   Procedure: ESOPHAGOGASTRODUODENOSCOPY (EGD);  Surgeon: Gatha Mayer, MD;  Location: [facility] ENDOSCOPY;  Service: Endoscopy;  Laterality: N/A;  . ESOPHAGOGASTRODUODENOSCOPY N/A 05/06/2016   Procedure: ESOPHAGOGASTRODUODENOSCOPY (EGD);  Surgeon: Jonathon Bellows, MD;  Location: [facility] ENDOSCOPY;  Service: Gastroenterology;  Laterality: N/A;  . ESOPHAGOGASTRODUODENOSCOPY (EGD) WITH PROPOFOL N/A 02/29/2016   Procedure: ESOPHAGOGASTRODUODENOSCOPY (EGD) WITH PROPOFOL;  Surgeon: [name], MD;  Location: [facility] ENDOSCOPY;  Service: Endoscopy;  Laterality: N/A;  . LAPAROTOMY N/A 09/12/2015   Procedure: EXPLORATORY LAPAROTOMY;  Surgeon: Florene Glen, MD;  Location: [facility] ORS;  Service: General;  Laterality: N/A;  . SIGMOIDOSCOPY N/A 09/12/2015   Procedure: Lonell Face;  Surgeon: Florene Glen, MD;  Location: [facility] ORS;  Service: General;  Laterality: N/A;  . WISDOM TOOTH EXTRACTION     Patient Active Problem List   Diagnosis Date Noted  . Malingering 05/06/2016  . Foreign body aspiration   . FB GI (foreign body in gastrointestinal tract)   . Swallowed foreign body 04/23/2016  . Foreign body ingestion 04/21/2016  . Gastric foreign body   . Breast tumor 03/05/2016  . Schizoaffective disorder, bipolar type ([place]) 11/06/2014  . Tobacco use disorder 09/30/2014  . Hypothyroidism 09/29/2014  . Hypertension 09/29/2014      Prior to Admission medications   Medication Sig Start Date End Date Taking? Authorizing Provider  amantadine (SYMMETREL) 100 MG capsule Take 1 capsule (  100 mg total) by mouth 2 (two) times daily. 03/07/16   [name], MD  amLODipine (NORVASC) 2.5 MG tablet Take 1 tablet (2.5 mg total) by mouth daily. 06/19/15   [name], MD  amoxicillin-clavulanate (AUGMENTIN) 875-125 MG tablet Take 1 tablet by mouth every 12 (twelve)  hours. Patient not taking: Reported on 06/13/2016 05/13/16   Fritzi Mandes, MD  fluPHENAZine (PROLIXIN) 5 MG tablet Take 5-10 mg by mouth See admin instructions. Take 5 mg by mouth in the morning, take 5 mg by mouth in the afternoon and take 10 mg by mouth at bedtime.    Historical Provider, MD  levothyroxine (SYNTHROID, LEVOTHROID) 75 MCG tablet Take 1 tablet (75 mcg total) by mouth daily before breakfast. 06/19/15   [name] [name], MD  Lurasidone HCl (LATUDA) 120 MG TABS Take 120 mg by mouth every evening.    Historical Provider, MD  OXcarbazepine (TRILEPTAL) 150 MG tablet Take 1 tablet (150 mg total) by mouth 2 (two) times daily. 06/19/15   [name], MD  traZODone (DESYREL) 50 MG tablet Take 50 mg by mouth at bedtime.    Historical Provider, MD  venlafaxine XR (EFFEXOR-XR) 150 MG 24 hr capsule Take 1 capsule (150 mg total) by mouth daily with breakfast. 06/19/15   [name], MD    Allergies Betadine [povidone iodine]; Iodine; and Shellfish-derived products    Social History Social History  Substance Use Topics  . Smoking status: Current Every Day Smoker    Packs/day: 0.50    Years: 3.00    Types: Cigarettes  . Smokeless tobacco: Never Used  . Alcohol use No    Review of Systems Patient denies headaches, rhinorrhea, blurry vision, numbness, shortness of breath, chest pain, edema, cough, abdominal pain, nausea, vomiting, diarrhea, dysuria, fevers, rashes or hallucinations unless otherwise stated above in HPI. ____________________________________________   PHYSICAL EXAM:  VITAL SIGNS: Vitals:   06/13/16 1831 06/13/16 1909  BP:  127/83  Pulse:  (!) 103  Resp:  18  Temp: 98 F (36.7 C)     Constitutional: Alert and oriented. Well appearing and in no acute distress. Eyes: Conjunctivae are normal. PERRL. EOMI. Head: Atraumatic. Nose: No congestion/rhinnorhea. Mouth/Throat: Mucous membranes are moist.  Oropharynx non-erythematous. Neck: No stridor.  Painless ROM. No cervical spine tenderness to palpation Hematological/Lymphatic/Immunilogical: No cervical lymphadenopathy. Cardiovascular: Normal rate, regular rhythm. Grossly normal heart sounds.  Good peripheral circulation. Respiratory: Normal respiratory effort.  No retractions. Lungs CTAB. Gastrointestinal: Soft and nontender. No distention. No abdominal bruits. No CVA tenderness. Genitourinary:  Musculoskeletal: No lower extremity tenderness nor edema.  No joint effusions. Neurologic:  Normal speech and language. No gross focal neurologic deficits are appreciated. No gait instability. Skin:  Skin is warm, dry and intact. No rash noted.   ____________________________________________   LABS (all labs ordered are listed, but only abnormal results are displayed)  Results for orders placed or performed during the hospital encounter of 06/13/16 (from the past 24 hour(s))  Basic metabolic panel     Status: None   Collection Time: 06/13/16  8:11 PM  Result Value Ref Range   Sodium 139 135 - 145 mmol/L   Potassium 3.9 3.5 - 5.1 mmol/L   Chloride 107 101 - 111 mmol/L   CO2 27 22 - 32 mmol/L   Glucose, Bld 90 65 - 99 mg/dL   BUN 8 6 - 20 mg/dL   Creatinine, Ser 0.77 0.44 - 1.00 mg/dL   Calcium 9.7 8.9 - 10.3 mg/dL  GFR calc non Af Amer >60 >60 mL/min   GFR calc Af Amer >60 >60 mL/min   Anion gap 5 5 - 15   ____________________________________________ ____________________________________________  RADIOLOGY  I personally reviewed all radiographic images ordered to evaluate for the above acute complaints and reviewed radiology reports and findings.  These findings were personally discussed with the patient.  Please see medical record for radiology report.  ____________________________________________   PROCEDURES  Procedure(s) performed:  Procedures    Critical Care performed: no ____________________________________________   INITIAL IMPRESSION / ASSESSMENT AND PLAN /  ED COURSE  Pertinent labs & imaging results that were available during my care of the patient were reviewed by me and considered in my medical decision making (see chart for details).  DDX: Si, Hi, psychosis, intentional ingestion  Melanie Bell is a 26 y.o. who presents to the ED with intentional ingestion of thumb tacks. Patient otherwise hemodynamic stable. Patient without any SI or HI. Patient with multiple visits to the ER for similar episodes. She's had multiple endoscopies in the past several months. I discussed the case with Dr. Allen Norris of gastroenterology who is agreed to come evaluate the patient for endoscopic removal of these thumb tacks. At this point I do believe that the patient will need a multidisciplinary approach and evaluation with the care plan as I am concerned that we are enabling this behavior. Do feel that we also should consider discussion a moving group homes as she seems to be gaining access to these objects quite frequently. Based on her medical illness have consult did TTS, psychiatry and social work with case management.  Clinical Course as of Jun 15 27  Mon Jun 13, 2016  2245 Patient has returned from endoscopy suite where they actually found a open safety pin and her critical pharynx that was removed endoscopically.  Unfortunately IV other objects are too distal to be endoscopically removed. A spoke with general surgery regarding other options. Typically in the setting conservative management with serial abdominal exams and radiographs to confirm continued passage is the standard of care. Patient will be kept nothing by mouth. I placed orders for serial KUB. Patient will be kept as IVC. [PR]    Clinical Course User Index [PR] Merlyn Lot, MD     ____________________________________________   FINAL CLINICAL IMPRESSION(S) / ED DIAGNOSES  Final diagnoses:  Swallowed foreign body, initial encounter      NEW MEDICATIONS STARTED DURING THIS  VISIT:  New Prescriptions   No medications on file     Note:  This document was prepared using Dragon voice recognition software and may include unintentional dictation errors.    Merlyn Lot, MD 06/14/16 0030

## 2016-06-13 NOTE — ED Triage Notes (Signed)
Pt arrives via [facility] from [facility] with reports of swallowing an open safety pin and two thumbtacks  Pt alert upon arrival - dry heaves present   She reports  "The voices told me to swallow them"   History of schizophrenia

## 2016-06-13 NOTE — Anesthesia Procedure Notes (Signed)
Procedure Name: Intubation Date/Time: 06/13/2016 8:54 PM Performed by: Hedda Slade Pre-anesthesia Checklist: Patient identified, Emergency Drugs available, Suction available and Patient being monitored Patient Re-evaluated:Patient Re-evaluated prior to inductionOxygen Delivery Method: Circle system utilized Preoxygenation: Pre-oxygenation with 100% oxygen Intubation Type: IV induction, Rapid sequence and Cricoid Pressure applied Laryngoscope Size: Mac and 3 Grade View: Grade I Tube type: Oral Tube size: 7.0 mm Number of attempts: 1 Placement Confirmation: ETT inserted through vocal cords under direct vision,  positive ETCO2 and breath sounds checked- equal and bilateral Secured at: 21 cm Tube secured with: Tape Dental Injury: Teeth and Oropharynx as per pre-operative assessment  Comments: Safety pin noted in the back of patients oropharynx upon Direct laryngoscopy; [place] and [place]ohl at patients bedside and aware; pin posterior to glottic opening and ETT placed without manipulation of pin

## 2016-06-13 NOTE — Progress Notes (Signed)
TTS unable to complete assessment at this time. Patient has been take to the O.R. for a procedure.

## 2016-06-14 ENCOUNTER — Encounter: Payer: Self-pay | Admitting: Gastroenterology

## 2016-06-14 ENCOUNTER — Emergency Department: Payer: Medicaid Other

## 2016-06-14 IMAGING — CR DG ABDOMEN 1V
1 series · 1 of 1 positions shown · non-contrast
Comparison: Radiographs yesterday at [WG] hour. CT yesterday at
[WG] hour

CLINICAL DATA: Foreign body follow up.

EXAM:
ABDOMEN - 1 VIEW

[dg abd 1 view]
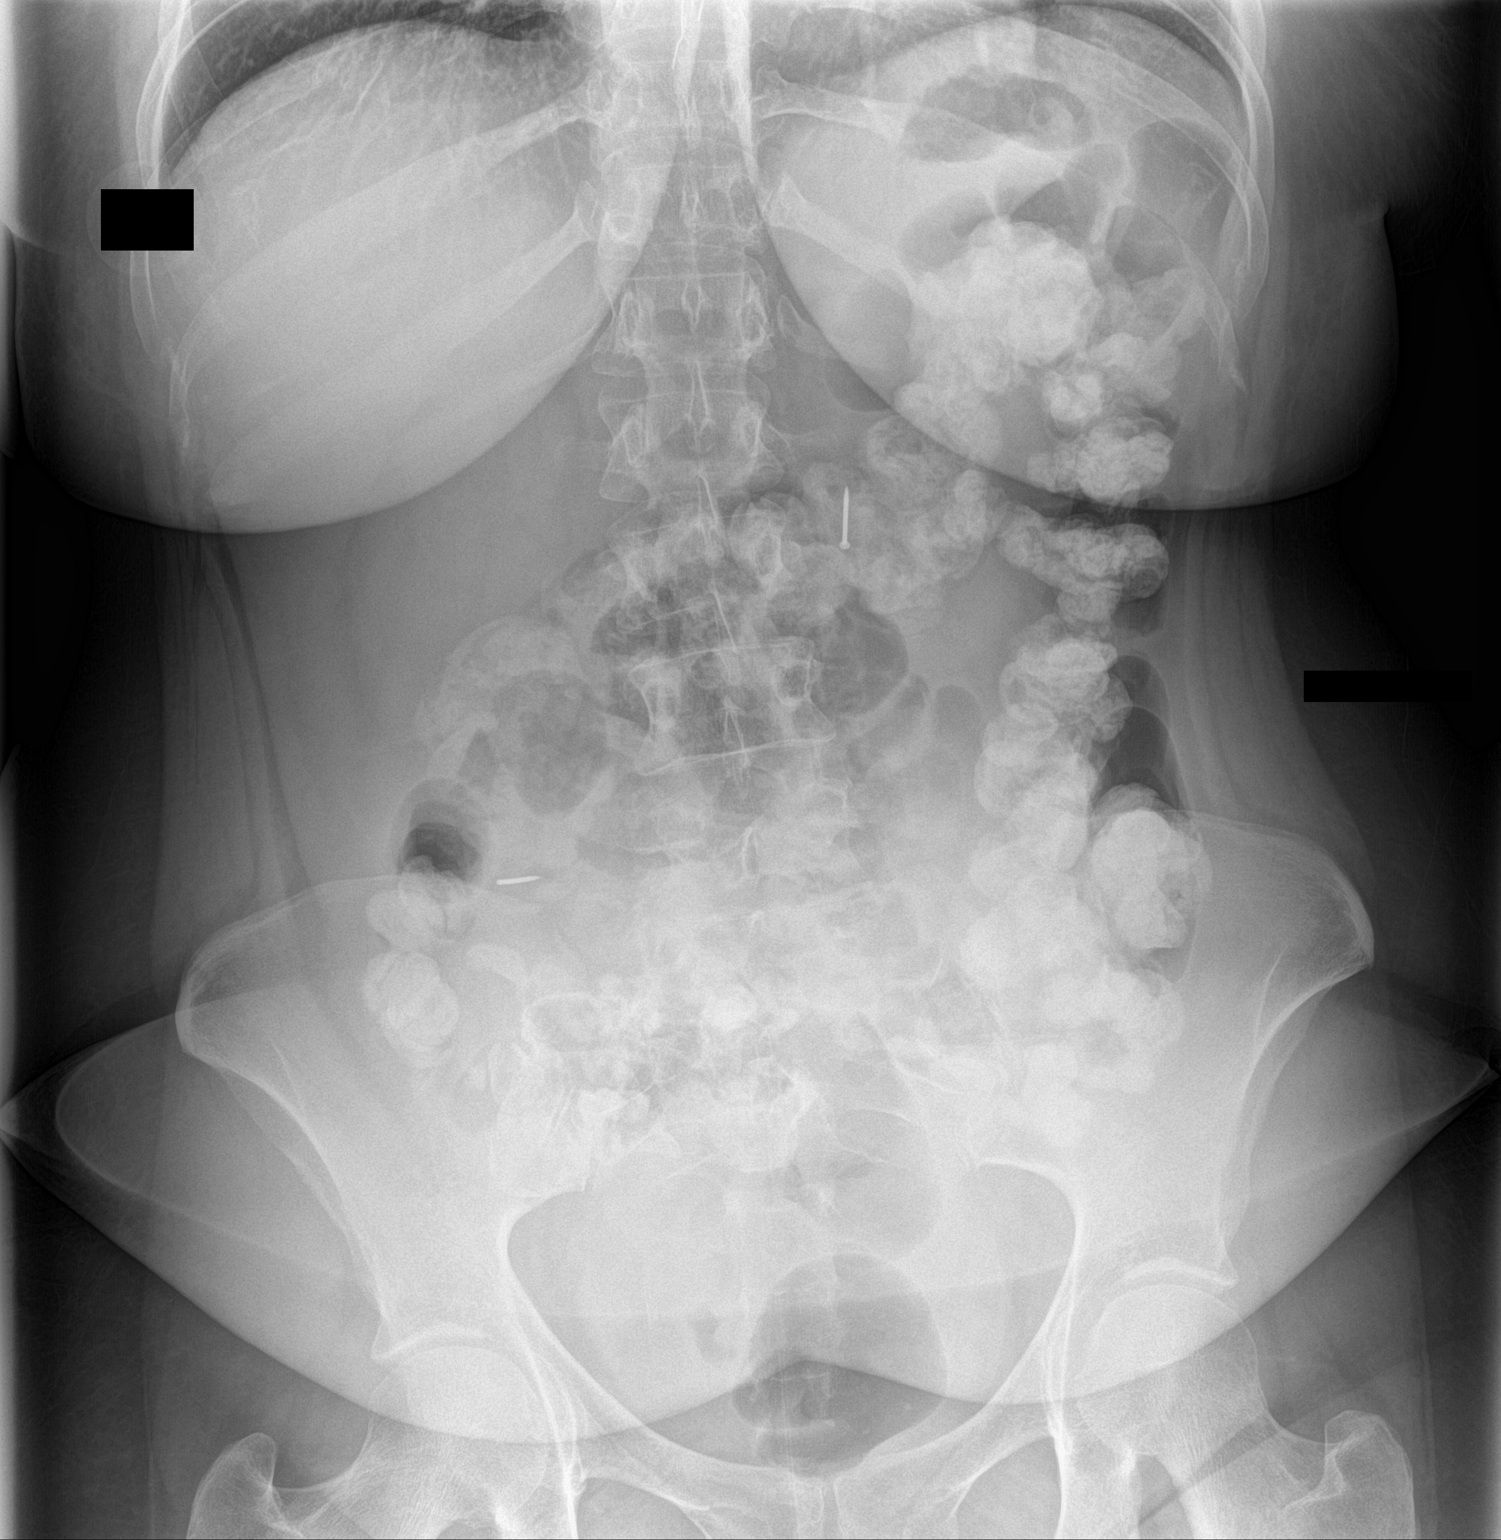

[1 of 1 positions shown; findings below may reference images not displayed]

FINDINGS: Two linear foreign bodies again seen in the abdomen, 1 projects over
the left upper quadrant and has the appearance of a tack/nail,
second is now in the right mid abdomen and may reflect a tack. There
is enteric contrast throughout the colon from prior CT. No evidence
of perforation, free air or bowel obstruction.
IMPRESSION: Two foreign bodies in the abdomen, 1 remains in the left upper
quadrant, likely within small bowel. Second linear foreign body is
now in the right mid abdomen, likely within more distal small bowel.

No evidence of free air or obstruction.

## 2016-06-14 NOTE — ED Notes (Signed)
Attempted to call group home x2. Unable to leave message.

## 2016-06-14 NOTE — ED Notes (Signed)
CSW received consult for possible placement. Pt has been psychiatrically and medically cleared and will return to her [place]. Placement is not needed at this time. CSW signing off. However, CSW is available should another need arise.  Georga Kaufmann, MSW, [place]

## 2016-06-14 NOTE — ED Notes (Signed)
Spoke with Dub Mikes, pt's caretaker, going to pick up patient.

## 2016-09-29 ENCOUNTER — Emergency Department
Admission: EM | Admit: 2016-09-29 | Discharge: 2016-09-29 | Disposition: A | Discharge status: Home / Self Care | Payer: Medicaid Other | Attending: Emergency Medicine | Admitting: Emergency Medicine

## 2016-09-29 ENCOUNTER — Encounter: Payer: Self-pay | Admitting: Emergency Medicine

## 2016-09-29 ENCOUNTER — Emergency Department: Payer: Medicaid Other

## 2016-09-29 DIAGNOSIS — E039 Hypothyroidism, unspecified: Secondary | ICD-10-CM | POA: Insufficient documentation

## 2016-09-29 DIAGNOSIS — J45909 Unspecified asthma, uncomplicated: Secondary | ICD-10-CM | POA: Diagnosis not present

## 2016-09-29 DIAGNOSIS — I10 Essential (primary) hypertension: Secondary | ICD-10-CM | POA: Diagnosis not present

## 2016-09-29 DIAGNOSIS — R0789 Other chest pain: Secondary | ICD-10-CM | POA: Diagnosis present

## 2016-09-29 DIAGNOSIS — F17210 Nicotine dependence, cigarettes, uncomplicated: Secondary | ICD-10-CM | POA: Insufficient documentation

## 2016-09-29 DIAGNOSIS — Z79899 Other long term (current) drug therapy: Secondary | ICD-10-CM | POA: Diagnosis not present

## 2016-09-29 DIAGNOSIS — F17200 Nicotine dependence, unspecified, uncomplicated: Secondary | ICD-10-CM

## 2016-09-29 IMAGING — CR DG CHEST 2V
1 series · 2 of 2 positions shown · non-contrast
Comparison: [DATE]

CLINICAL DATA: Chest pain and shortness of Breath today.

EXAM:
CHEST  2 VIEW

[Series 17: w chest pa · 0.14mm/px · 2 of 2 slices shown]
[im 1/2]
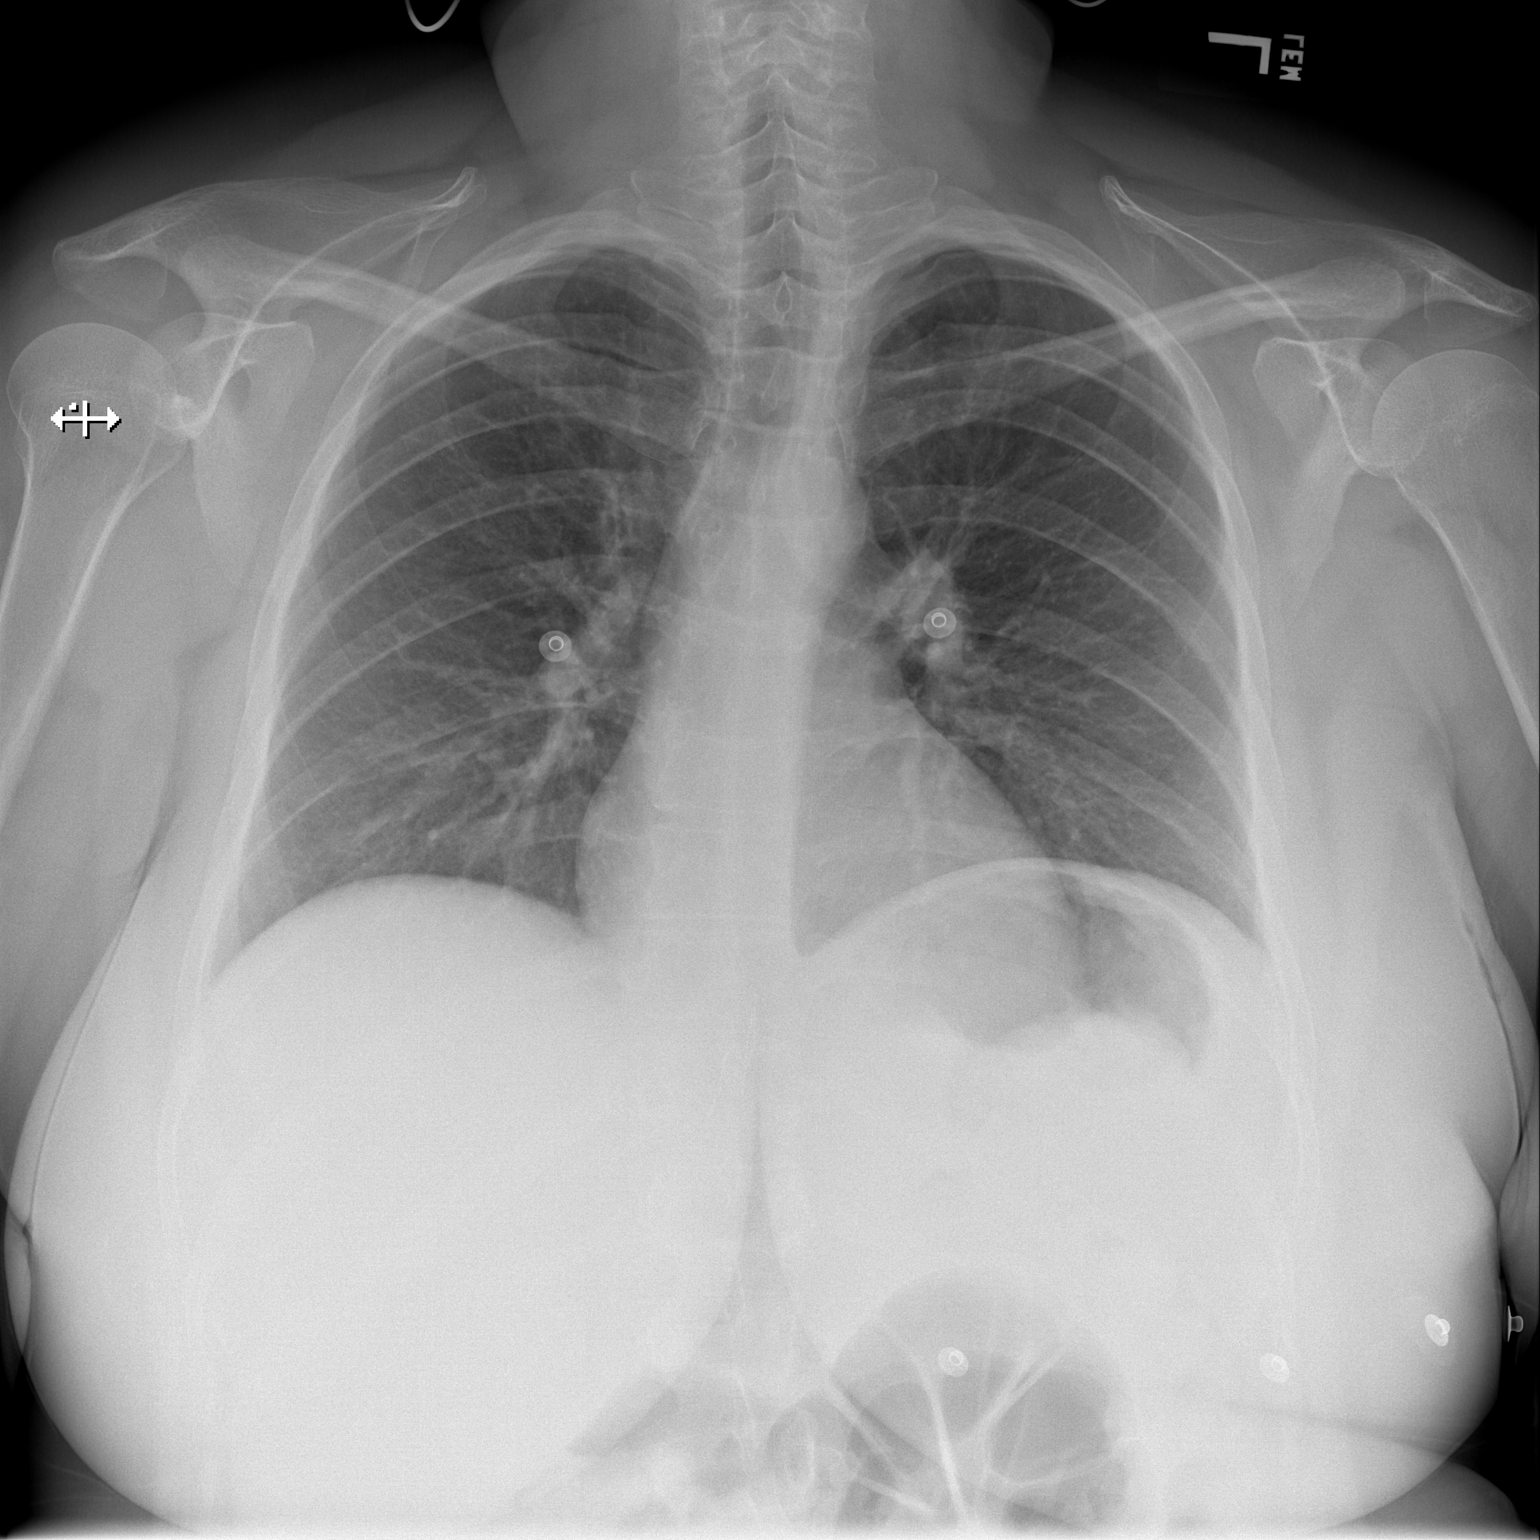
[im 2/2]
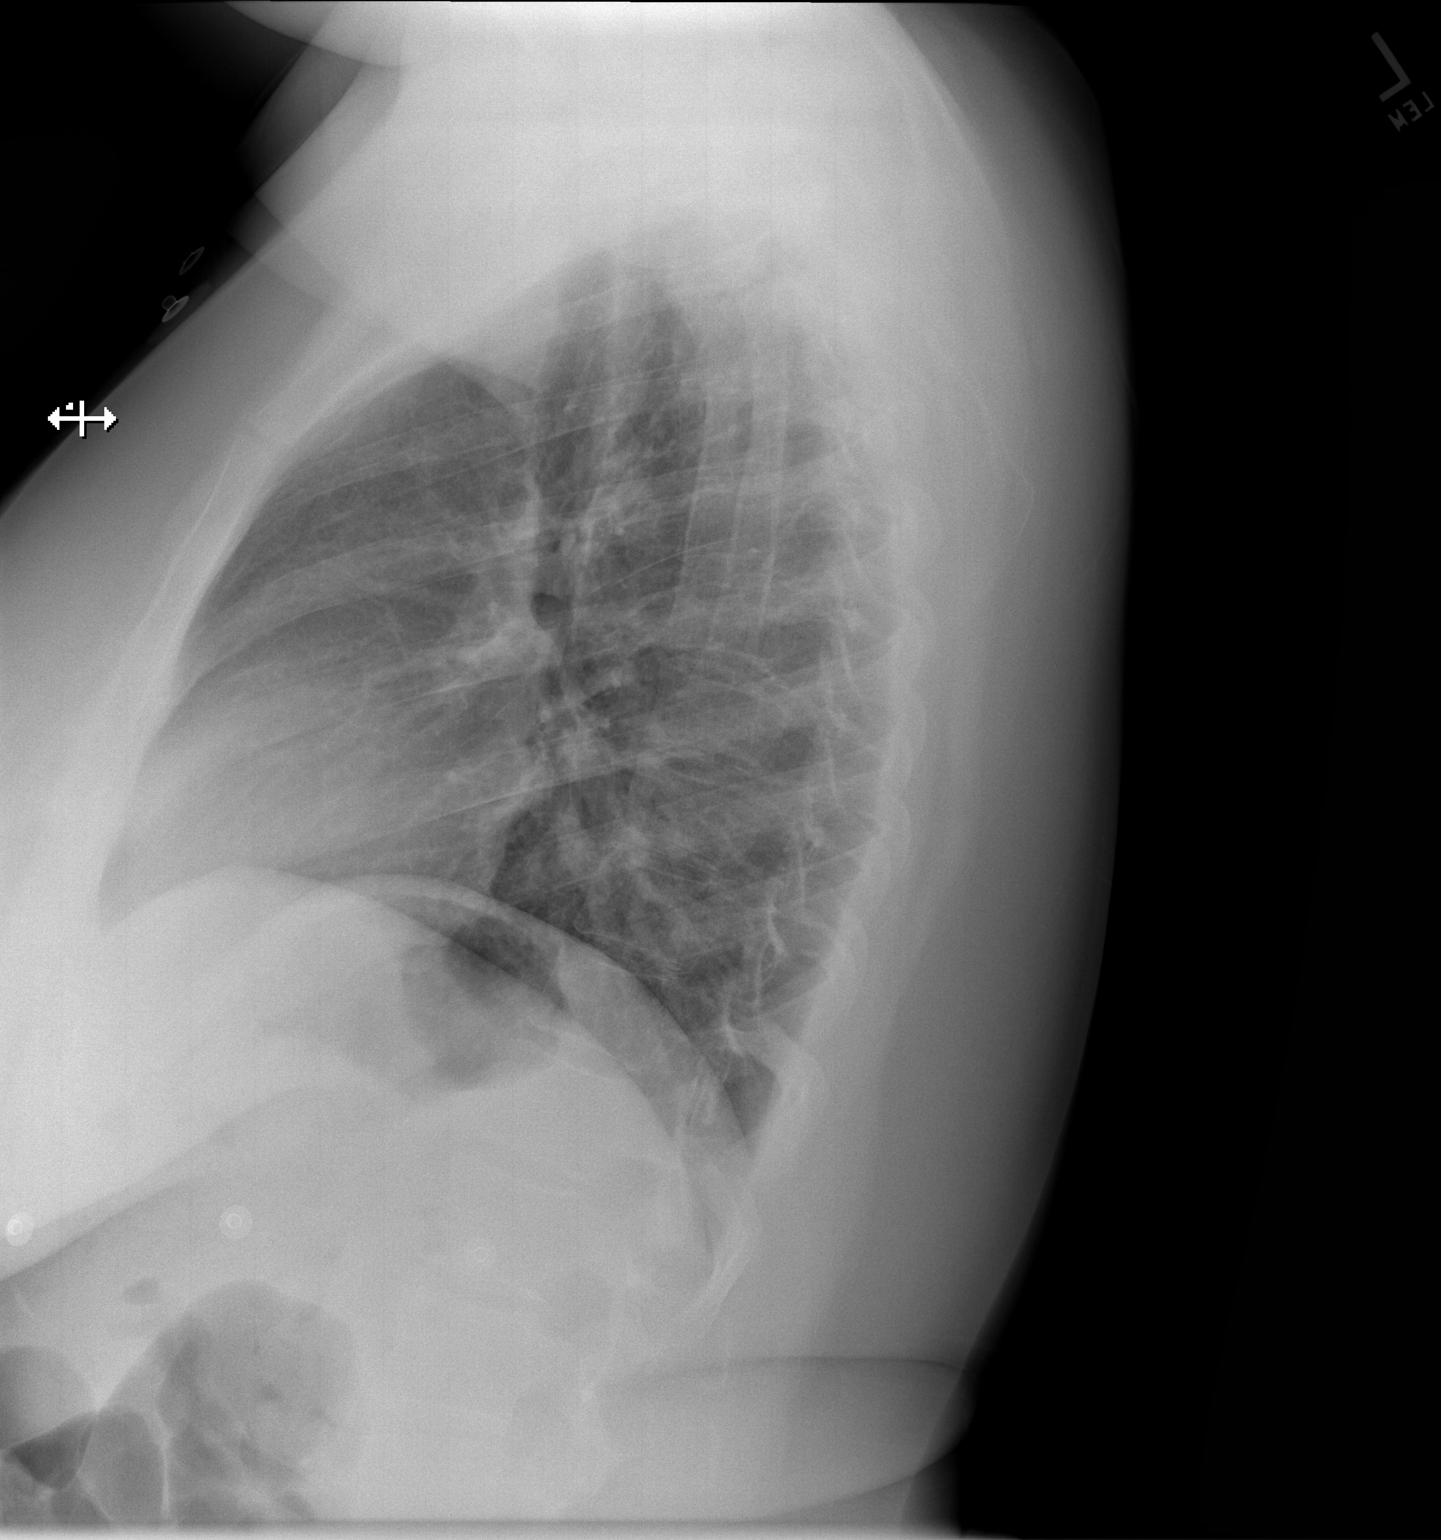

[2 of 2 positions shown; findings below may reference images not displayed]

FINDINGS: The heart size and mediastinal contours are within normal limits.
Both lungs are clear. The visualized skeletal structures are
unremarkable.
IMPRESSION: Normal chest x-ray.

## 2016-09-29 MED ORDER — IBUPROFEN 400 MG PO TABS
600.0000 mg | ORAL_TABLET | Freq: Once | ORAL | Status: AC
  Administered 2016-09-29: 600 mg via ORAL
  Filled 2016-09-29: qty 2

## 2016-09-29 NOTE — ED Provider Notes (Signed)
[facility] Emergency Department Provider Note  ____________________________________________   First MD Initiated Contact with Patient 09/29/16 1424     (approximate)  I have reviewed the triage vital signs and the nursing notes.   HISTORY  Chief Complaint Shortness of Breath and chest discomfort    HPI Melanie Bell is a 26 y.o. female who comes to the emergency department via EMS for atypical chest pain that began shortly after smoking a cigarette today. She was outside her group home smoking and shortly thereafter noted sharp pleuritic intermittent left upper chest pain. Nonexertional. Worse when taking a deep breath. Improved with rest. It has been constant since. She has no history of DVT or pulmonary embolism.   Past Medical History:  Diagnosis Date  . Anxiety   . Asthma   . Depression   . GERD (gastroesophageal reflux disease)   . Hallucinations 09/30/2014   Sizoaffective  . Hyperlipidemia   . Hypertension   . Intentional ingestion of batteries 04/21/2016    2 AAA batteries and 1 thumb tack; intent to hurt herself/notes 04/21/2016  . Tardive dyskinesia 10/2014   recent onset    Patient Active Problem List   Diagnosis Date Noted  . Malingering 05/06/2016  . Foreign body aspiration   . Other foreign object in esophagus causing other injury, subsequent encounter   . Swallowed foreign body 04/23/2016  . Foreign body ingestion 04/21/2016  . Gastric foreign body   . Breast tumor 03/05/2016  . Schizoaffective disorder, bipolar type ([place]) 11/06/2014  . Tobacco use disorder 09/30/2014  . Hypothyroidism 09/29/2014  . Hypertension 09/29/2014    Past Surgical History:  Procedure Laterality Date  . ABDOMINAL SURGERY     "years ago" to remove foreign objects  . APPENDECTOMY    . BREAST LUMPECTOMY Right   . COLONOSCOPY WITH PROPOFOL N/A 09/10/2015   Procedure: COLONOSCOPY WITH PROPOFOL;  Surgeon: [name], MD;  Location[facility]  ENDOSCOPY;  Service: Endoscopy;  Laterality: N/A;  . ESOPHAGOGASTRODUODENOSCOPY N/A 11/28/2014   Procedure: ESOPHAGOGASTRODUODENOSCOPY (EGD);  Surgeon: [name], MD;  Location: [facility] ENDOSCOPY;  Service: Endoscopy;  Laterality: N/A;  . ESOPHAGOGASTRODUODENOSCOPY N/A 02/21/2016   Procedure: ESOPHAGOGASTRODUODENOSCOPY (EGD);  Surgeon: Mauri Pole, MD;  Location: [facility] ENDOSCOPY;  Service: Endoscopy;  Laterality: N/A;  . ESOPHAGOGASTRODUODENOSCOPY N/A 04/21/2016   Procedure: ESOPHAGOGASTRODUODENOSCOPY (EGD);  Surgeon: Gatha Mayer, MD;  Location: [facility] ENDOSCOPY;  Service: Endoscopy;  Laterality: N/A;  . ESOPHAGOGASTRODUODENOSCOPY N/A 05/06/2016   Procedure: ESOPHAGOGASTRODUODENOSCOPY (EGD);  Surgeon: Jonathon Bellows, MD;  Location: [facility] ENDOSCOPY;  Service: Gastroenterology;  Laterality: N/A;  . ESOPHAGOGASTRODUODENOSCOPY N/A 06/13/2016   Procedure: ESOPHAGOGASTRODUODENOSCOPY (EGD);  Surgeon: [name], MD;  Location: [facility] ENDOSCOPY;  Service: Endoscopy;  Laterality: N/A;  . ESOPHAGOGASTRODUODENOSCOPY (EGD) WITH PROPOFOL N/A 02/29/2016   Procedure: ESOPHAGOGASTRODUODENOSCOPY (EGD) WITH PROPOFOL;  Surgeon: [name], MD;  Location: [facility] ENDOSCOPY;  Service: Endoscopy;  Laterality: N/A;  . LAPAROTOMY N/A 09/12/2015   Procedure: EXPLORATORY LAPAROTOMY;  Surgeon: Florene Glen, MD;  Location: [facility] ORS;  Service: General;  Laterality: N/A;  . SIGMOIDOSCOPY N/A 09/12/2015   Procedure: Lonell Face;  Surgeon: Florene Glen, MD;  Location: [facility] ORS;  Service: General;  Laterality: N/A;  . WISDOM TOOTH EXTRACTION      Prior to Admission medications   Medication Sig Start Date End Date Taking? Authorizing Provider  amantadine (SYMMETREL) 100 MG capsule Take 1 capsule (100 mg total) by mouth 2 (two) times daily. 03/07/16   [name], MD  amLODipine (NORVASC) 2.5 MG tablet Take 1 tablet (2.5 mg total) by mouth daily. 06/19/15   [name], Herma Ard B, MD  amoxicillin-clavulanate  (AUGMENTIN) 875-125 MG tablet Take 1 tablet by mouth every 12 (twelve) hours. Patient not taking: Reported on 06/13/2016 05/13/16   Fritzi Mandes, MD  fluPHENAZine (PROLIXIN) 5 MG tablet Take 5-10 mg by mouth See admin instructions. Take 5 mg by mouth in the morning, take 5 mg by mouth in the afternoon and take 10 mg by mouth at bedtime.    [provider]  levothyroxine (SYNTHROID, LEVOTHROID) 75 MCG tablet Take 1 tablet (75 mcg total) by mouth daily before breakfast. 06/19/15   [name], [name], MD  Lurasidone HCl (LATUDA) 120 MG TABS Take 120 mg by mouth every evening.    [provider]  OXcarbazepine (TRILEPTAL) 150 MG tablet Take 1 tablet (150 mg total) by mouth 2 (two) times daily. 06/19/15   [name], Herma Ard B, MD  traZODone (DESYREL) 50 MG tablet Take 50 mg by mouth at bedtime.    [provider]  venlafaxine XR (EFFEXOR-XR) 150 MG 24 hr capsule Take 1 capsule (150 mg total) by mouth daily with breakfast. 06/19/15   [name], [name], MD    Allergies Betadine [povidone iodine]; Iodine; and Shellfish-derived products  Family History  Problem Relation Age of Onset  . Depression Mother   . Hypertension Mother   . Sleep apnea Mother   . Asthma Mother   . COPD Mother   . Diabetes Mother     Social History Social History  Substance Use Topics  . Smoking status: Current Every Day Smoker    Packs/day: 0.50    Years: 3.00    Types: Cigarettes  . Smokeless tobacco: Never Used  . Alcohol use No    Review of Systems Constitutional: No fever/chills ENT: No sore throat. Cardiovascular: Positive chest pain. Respiratory: Positive shortness of breath. Gastrointestinal: No abdominal pain.  No nausea, no vomiting.  No diarrhea.  No constipation. Musculoskeletal: Negative for back pain. Neurological: Negative for headaches   ____________________________________________   PHYSICAL EXAM:  VITAL SIGNS: ED Triage Vitals  Enc Vitals Group     BP  09/29/16 1407 121/88     Pulse Rate 09/29/16 1407 91     Resp 09/29/16 1407 20     Temp 09/29/16 1407 98.3 F (36.8 C)     Temp Source 09/29/16 1407 Oral     SpO2 09/29/16 1407 95 %     Weight 09/29/16 1346 220 lb (99.8 kg)     Height --      Head Circumference --      Peak Flow --      Pain Score 09/29/16 1346 5     Pain Loc --      Pain Edu? --      Excl. in [place]? --     Constitutional: Alert and oriented x 4 well appearing nontoxic no diaphoresis speaks in full, clear sentences Head: Atraumatic. Nose: No congestion/rhinnorhea. Mouth/Throat: No trismus Neck: No stridor.   Cardiovascular: Regular rate and rhythm no murmurs Respiratory: Normal respiratory effort.  No retractions. Clear to auscultation bilaterally Gastrointestinal: Soft nontender Musculoskeletal: Legs are equal in size Neurologic:  Normal speech and language. No gross focal neurologic deficits are appreciated.  Skin:  Skin is warm, dry and intact. No rash noted.    ____________________________________________  LABS (all labs ordered are listed, but only abnormal results are displayed)  Labs Reviewed - No data to display  __________________________________________  EKG  ED ECG REPORT I, Darel Hong, the attending physician, personally viewed and interpreted this ECG.  Date: 09/29/2016 Rate: 77 Rhythm: normal sinus rhythm QRS Axis: normal Intervals: normal ST/T Wave abnormalities: normal Conduction Disturbances: none Narrative Interpretation: unremarkable  ____________________________________________  RADIOLOGY  Chest x-ray with no acute disease ____________________________________________   PROCEDURES  Procedure(s) performed: no  Procedures  Critical Care performed: no  Observation: no ____________________________________________   INITIAL IMPRESSION / ASSESSMENT AND PLAN / ED COURSE  Pertinent labs & imaging results that were available during my care of the patient were  reviewed by me and considered in my medical decision making (see chart for details).  The patient has atypical pleuritic chest pain and is PERC negative with a normal EKG and normal chest x-ray. Likely musculoskeletal. Given NSAIDs and a 2 week follow-up with her PCP. Strict return precautions given.      ____________________________________________   FINAL CLINICAL IMPRESSION(S) / ED DIAGNOSES  Final diagnoses:  Atypical chest pain  Smoking      NEW MEDICATIONS STARTED DURING THIS VISIT:  New Prescriptions   No medications on file     Note:  This document was prepared using Dragon voice recognition software and may include unintentional dictation errors.      Darel Hong, MD 09/29/16 [phone]

## 2016-09-29 NOTE — ED Triage Notes (Signed)
See first nurse notes 

## 2016-09-29 NOTE — ED Notes (Signed)
Pt to ed via ems with reports of smoking a cigarette and having some chest pain.  VSS.

## 2016-09-29 NOTE — Discharge Instructions (Signed)
Please take 600 mg of ibuprofen as needed for pain and follow-up with your primary care physician in the next week or 2 for recheck. Please stop smoking cigarettes.

## 2017-01-09 ENCOUNTER — Telehealth: Payer: Self-pay | Admitting: Obstetrics & Gynecology

## 2017-01-09 NOTE — Telephone Encounter (Signed)
Pt is being referred by [facility] for abnormal pap. Called and left voicemail to call back to be schedule

## 2017-01-11 NOTE — Telephone Encounter (Signed)
Attempted to reach patient. Was notified that patient no longer lives with the person whom answered and was unable to obtain an correct number to reach patient

## 2017-01-11 NOTE — Telephone Encounter (Signed)
Called and left voicemail for pt to call back  to schedule

## 2017-01-13 NOTE — Telephone Encounter (Signed)
Called and left voicemail for pt to call back  to schedule

## 2017-01-25 ENCOUNTER — Encounter: Payer: Self-pay | Admitting: Obstetrics and Gynecology

## 2017-01-25 ENCOUNTER — Ambulatory Visit (INDEPENDENT_AMBULATORY_CARE_PROVIDER_SITE_OTHER): Payer: Medicare Other | Admitting: Obstetrics and Gynecology

## 2017-01-25 VITALS — BP 110/70 | HR 103 | Ht 66.0 in | Wt 243.0 lb

## 2017-01-25 DIAGNOSIS — R87610 Atypical squamous cells of undetermined significance on cytologic smear of cervix (ASC-US): Secondary | ICD-10-CM | POA: Diagnosis not present

## 2017-01-25 NOTE — Progress Notes (Signed)
Obstetrics & Gynecology Office Visit   Chief Complaint  Patient presents with  . Abnormal Pap Smear    Referred by Casilda Carls   History of Present Illness: 26 y.o. G0P0000 female who presents in referral from Dr. Casilda Carls, MD, PhD, from [facility], for evaluation of an abnormal pap smear.  On 12/22/16 she underwent a pap smear that returned as ASC-US, HPV negative.  She has a history several years ago of an abnormal pap smear that returned as LGSIL. She states that she underwent biopsies that were negative.  She reports that she has been on Depo Provera for a long time. Normally she does not have menses, but began having bleeding recently that she considered heavy. The pap smear was a part of the effort to investigate her bleeding issue. Of note, she had negative STD test, as well.  She states that she is not sexually active.     Past Medical History:  Diagnosis Date  . Anxiety   . Asthma   . Depression   . GERD (gastroesophageal reflux disease)   . Hallucinations 09/30/2014   Sizoaffective  . Hyperlipidemia   . Hypertension   . Intentional ingestion of batteries 04/21/2016    2 AAA batteries and 1 thumb tack; intent to hurt herself/notes 04/21/2016  . Tardive dyskinesia 10/2014   recent onset    Past Surgical History:  Procedure Laterality Date  . ABDOMINAL SURGERY     "years ago" to remove foreign objects  . APPENDECTOMY    . BREAST LUMPECTOMY Right   . COLONOSCOPY WITH PROPOFOL N/A 09/10/2015   Procedure: COLONOSCOPY WITH PROPOFOL;  Surgeon: [name], MD;  Location: [facility] ENDOSCOPY;  Service: Endoscopy;  Laterality: N/A;  . ESOPHAGOGASTRODUODENOSCOPY N/A 11/28/2014   Procedure: ESOPHAGOGASTRODUODENOSCOPY (EGD);  Surgeon: [name], MD;  Location: [facility] ENDOSCOPY;  Service: Endoscopy;  Laterality: N/A;  . ESOPHAGOGASTRODUODENOSCOPY N/A 02/21/2016   Procedure: ESOPHAGOGASTRODUODENOSCOPY (EGD);  Surgeon: Mauri Pole, MD;   Location: [facility] ENDOSCOPY;  Service: Endoscopy;  Laterality: N/A;  . ESOPHAGOGASTRODUODENOSCOPY N/A 04/21/2016   Procedure: ESOPHAGOGASTRODUODENOSCOPY (EGD);  Surgeon: Gatha Mayer, MD;  Location: [facility] ENDOSCOPY;  Service: Endoscopy;  Laterality: N/A;  . ESOPHAGOGASTRODUODENOSCOPY N/A 05/06/2016   Procedure: ESOPHAGOGASTRODUODENOSCOPY (EGD);  Surgeon: Jonathon Bellows, MD;  Location: [facility] ENDOSCOPY;  Service: Gastroenterology;  Laterality: N/A;  . ESOPHAGOGASTRODUODENOSCOPY N/A 06/13/2016   Procedure: ESOPHAGOGASTRODUODENOSCOPY (EGD);  Surgeon: [name], MD;  Location: [facility] ENDOSCOPY;  Service: Endoscopy;  Laterality: N/A;  . ESOPHAGOGASTRODUODENOSCOPY (EGD) WITH PROPOFOL N/A 02/29/2016   Procedure: ESOPHAGOGASTRODUODENOSCOPY (EGD) WITH PROPOFOL;  Surgeon: [name], MD;  Location: [facility] ENDOSCOPY;  Service: Endoscopy;  Laterality: N/A;  . LAPAROTOMY N/A 09/12/2015   Procedure: EXPLORATORY LAPAROTOMY;  Surgeon: Florene Glen, MD;  Location: [facility] ORS;  Service: General;  Laterality: N/A;  . SIGMOIDOSCOPY N/A 09/12/2015   Procedure: Lonell Face;  Surgeon: Florene Glen, MD;  Location: [facility] ORS;  Service: General;  Laterality: N/A;  . WISDOM TOOTH EXTRACTION      Gynecologic History: No LMP recorded. Patient has had an injection.  Obstetric History: G0P0000  Family History  Problem Relation Age of Onset  . Depression Mother   . Hypertension Mother   . Sleep apnea Mother   . Asthma Mother   . COPD Mother   . Diabetes Mother     Social History   Social History  . Marital status: Single    Spouse name: N/A  . Number of children: N/A  .  Years of education: N/A   Occupational History  . Not on file.   Social History Main Topics  . Smoking status: Former Smoker    Packs/day: 0.50    Years: 3.00    Types: Cigarettes    Quit date: 08/2016  . Smokeless tobacco: Never Used  . Alcohol use No  . Drug use: No  . Sexual activity: No   Other Topics Concern  . Not on file   Social  History Narrative  . No narrative on file    Allergies  Allergen Reactions  . Betadine [Povidone Iodine] Other (See Comments)    Reaction:  Unknown   . Iodine Rash  . Shellfish-Derived Products Other (See Comments)    Reaction:  Unknown     Prior to Admission medications   Medication Sig Start Date End Date Taking? Authorizing Provider  amLODipine (NORVASC) 2.5 MG tablet Take 1 tablet (2.5 mg total) by mouth daily. 06/19/15  Yes [name], [name], MD  clozapine (CLOZARIL) 50 MG tablet Take 150 mg by mouth. 08/08/16  Yes [provider]  lithium carbonate 300 MG capsule Take 300 mg by mouth.   Yes [provider]  medroxyPROGESTERone (DEPO-PROVERA) 150 MG/ML injection Inject 150 mg into the muscle every 3 (three) months.   Yes [provider]  omeprazole (PRILOSEC) 20 MG capsule Take 20 mg by mouth daily.   Yes [provider]  OXcarbazepine (TRILEPTAL) 150 MG tablet Take 1 tablet (150 mg total) by mouth 2 (two) times daily. 06/19/15  Yes [name], [name], MD  venlafaxine XR (EFFEXOR-XR) 150 MG 24 hr capsule Take 1 capsule (150 mg total) by mouth daily with breakfast. 06/19/15  Yes [name], [name], MD    Review of Systems  Constitutional: Negative.   HENT: Negative.   Eyes: Negative.   Respiratory: Negative.   Cardiovascular: Negative.   Gastrointestinal: Negative.   Genitourinary: Negative.   Musculoskeletal: Negative.   Skin: Negative.   Neurological: Negative.   Psychiatric/Behavioral: Negative.      Physical Exam BP 110/70 (BP Location: Left Arm, Patient Position: Sitting, Cuff Size: Large)   Pulse (!) 103   Ht 5\' 6"  (1.676 m)   Wt 243 lb (110.2 kg)   BMI 39.22 kg/m  No LMP recorded. Patient has had an injection. Physical Exam  Constitutional: She is oriented to person, place, and time. She appears well-developed and well-nourished. No distress.  Eyes: Conjunctivae are normal. No scleral icterus.  Neurological: She is  alert and oriented to person, place, and time.  Psychiatric: She has a normal mood and affect. Her behavior is normal. Judgment normal.   Assessment: 26 y.o. G0P0000 female here for referral from ASCUS pap smear with HPV negative testing.  Plan: Problem List Items Addressed This Visit    None    Visit Diagnoses    ASCUS of cervix with negative high risk HPV    -  Primary     Discussed her pap smear result. I offered to go ahead and perform the colposcopy given her history of abnormal pap smears. However, given that her HPV result was negative, I offered to have her repeat the pap smear in one year.  The ASCCP guidelines allow for repeating the pap smear in 3 years with this result. However, given her history of abnormal pap smears, I believe that a one-year repeat of the pap smear would be reasonable.  I do not believe that the results from this pap smear explain her recent  bleeding episodes.  After 20 minutes of discussion, a mutual discussion was made to forgo the colposcopy today in favor of repeating her pap smear with HPV in one year.  All questions answered.  I am happy to see her sooner or on an as-needed basis.   Prentice Docker, MD 01/25/2017 1:27 PM     CC: Casilda Carls, [address] [place], [place] [id number]

## 2017-04-28 ENCOUNTER — Other Ambulatory Visit: Payer: Self-pay | Admitting: Internal Medicine

## 2017-04-28 DIAGNOSIS — N6322 Unspecified lump in the left breast, upper inner quadrant: Secondary | ICD-10-CM

## 2017-05-04 ENCOUNTER — Ambulatory Visit
Admission: RE | Admit: 2017-05-04 | Discharge: 2017-05-04 | Disposition: A | Discharge status: Home / Self Care | Payer: Medicaid Other | Source: Ambulatory Visit | Attending: Internal Medicine | Admitting: Internal Medicine

## 2017-05-04 DIAGNOSIS — N6322 Unspecified lump in the left breast, upper inner quadrant: Secondary | ICD-10-CM | POA: Diagnosis not present

## 2017-08-28 ENCOUNTER — Encounter: Payer: Self-pay | Admitting: Obstetrics & Gynecology

## 2017-08-28 ENCOUNTER — Ambulatory Visit (INDEPENDENT_AMBULATORY_CARE_PROVIDER_SITE_OTHER): Payer: Medicare Other | Admitting: Obstetrics & Gynecology

## 2017-08-28 VITALS — BP 120/80 | HR 82 | Ht 68.0 in | Wt 228.0 lb

## 2017-08-28 DIAGNOSIS — R87610 Atypical squamous cells of undetermined significance on cytologic smear of cervix (ASC-US): Secondary | ICD-10-CM

## 2017-08-28 NOTE — Patient Instructions (Signed)
Colposcopy, Care After This sheet gives you information about how to care for yourself after your procedure. Your health care provider may also give you more specific instructions. If you have problems or questions, contact your health care provider. What can I expect after the procedure? If you had a colposcopy without a biopsy, you can expect to feel fine right away, but you may have some spotting for a few days. You can go back to your normal activities. If you had a colposcopy with a biopsy, it is common to have:  Soreness and pain. This may last for a few days.  Light-headedness.  Mild vaginal bleeding or dark-colored, grainy discharge. This may last for a few days. The discharge may be due to a solution that was used during the procedure. You may need to wear a sanitary pad during this time.  Spotting for at least 48 hours after the procedure.  Follow these instructions at home:  Take over-the-counter and prescription medicines only as told by your health care provider. Talk with your health care provider about what type of over-the-counter pain medicine and prescription medicine you can start taking again. It is especially important to talk with your health care provider if you take blood-thinning medicine.  Do not drive or use heavy machinery while taking prescription pain medicine.  For at least 3 days after your procedure, or as long as told by your health care provider, avoid: ? Douching. ? Using tampons. ? Having sexual intercourse.  Continue to use birth control (contraception).  Limit your physical activity for the first day after the procedure as told by your health care provider. Ask your health care provider what activities are safe for you.  It is up to you to get the results of your procedure. Ask your health care provider, or the department performing the procedure, when your results will be ready.  Keep all follow-up visits as told by your health care provider.  This is important. Contact a health care provider if:  You develop a skin rash. Get help right away if:  You are bleeding heavily from your vagina or you are passing blood clots. This includes using more than one sanitary pad per hour for 2 hours in a row.  You have a fever or chills.  You have pelvic pain.  You have abnormal, yellow-colored, or bad-smelling vaginal discharge. This could be a sign of infection.  You have severe pain or cramps in your lower abdomen that do not get better with medicine.  You feel light-headed or dizzy, or you faint. Summary  If you had a colposcopy without a biopsy, you can expect to feel fine right away, but you may have some spotting for a few days. You can go back to your normal activities.  If you had a colposcopy with a biopsy, you may notice mild pain and spotting for 48 hours after the procedure.  Avoid douching, using tampons, and having sexual intercourse for 3 days after the procedure or as long as told by your health care provider.  Contact your health care provider if you have bleeding, severe pain, or signs of infection. This information is not intended to replace advice given to you by your health care provider. Make sure you discuss any questions you have with your health care provider. Document Released: 02/20/2013 Document Revised: 12/18/2015 Document Reviewed: 12/18/2015 Elsevier Interactive Patient Education  2018 [employer]  
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
## 2017-08-30 LAB — PATHOLOGY

## 2017-11-22 ENCOUNTER — Emergency Department: Payer: Medicare Other

## 2017-11-22 ENCOUNTER — Ambulatory Visit (HOSPITAL_COMMUNITY)
Admission: EM | Admit: 2017-11-22 | Discharge: 2017-11-22 | Disposition: A | Discharge status: Home / Self Care | Payer: No Typology Code available for payment source | Source: Ambulatory Visit | Attending: Emergency Medicine | Admitting: Emergency Medicine

## 2017-11-22 ENCOUNTER — Emergency Department
Admission: EM | Admit: 2017-11-22 | Discharge: 2017-11-22 | Disposition: A | Discharge status: Home / Self Care | Payer: Medicare Other | Attending: Emergency Medicine | Admitting: Emergency Medicine

## 2017-11-22 ENCOUNTER — Ambulatory Visit
Admission: EM | Admit: 2017-11-22 | Discharge: 2017-11-22 | Disposition: A | Discharge status: Home / Self Care | Payer: Medicare Other | Source: Ambulatory Visit | Attending: Emergency Medicine | Admitting: Emergency Medicine

## 2017-11-22 DIAGNOSIS — T192XXA Foreign body in vulva and vagina, initial encounter: Secondary | ICD-10-CM | POA: Insufficient documentation

## 2017-11-22 DIAGNOSIS — Z0441 Encounter for examination and observation following alleged adult rape: Secondary | ICD-10-CM | POA: Insufficient documentation

## 2017-11-22 DIAGNOSIS — T7421XA Adult sexual abuse, confirmed, initial encounter: Secondary | ICD-10-CM | POA: Insufficient documentation

## 2017-11-22 DIAGNOSIS — E039 Hypothyroidism, unspecified: Secondary | ICD-10-CM | POA: Insufficient documentation

## 2017-11-22 DIAGNOSIS — Z87891 Personal history of nicotine dependence: Secondary | ICD-10-CM | POA: Insufficient documentation

## 2017-11-22 DIAGNOSIS — Y998 Other external cause status: Secondary | ICD-10-CM | POA: Insufficient documentation

## 2017-11-22 DIAGNOSIS — Y92480 Sidewalk as the place of occurrence of the external cause: Secondary | ICD-10-CM | POA: Insufficient documentation

## 2017-11-22 DIAGNOSIS — Y9301 Activity, walking, marching and hiking: Secondary | ICD-10-CM | POA: Insufficient documentation

## 2017-11-22 DIAGNOSIS — I10 Essential (primary) hypertension: Secondary | ICD-10-CM | POA: Insufficient documentation

## 2017-11-22 DIAGNOSIS — Z79899 Other long term (current) drug therapy: Secondary | ICD-10-CM | POA: Insufficient documentation

## 2017-11-22 DIAGNOSIS — J45909 Unspecified asthma, uncomplicated: Secondary | ICD-10-CM | POA: Insufficient documentation

## 2017-11-22 DIAGNOSIS — Z915 Personal history of self-harm: Secondary | ICD-10-CM | POA: Insufficient documentation

## 2017-11-22 DIAGNOSIS — F250 Schizoaffective disorder, bipolar type: Secondary | ICD-10-CM | POA: Diagnosis not present

## 2017-11-22 LAB — URINALYSIS, COMPLETE (UACMP) WITH MICROSCOPIC
Bilirubin Urine: NEGATIVE
GLUCOSE, UA: NEGATIVE mg/dL
Hgb urine dipstick: NEGATIVE
Ketones, ur: NEGATIVE mg/dL
Nitrite: NEGATIVE
Protein, ur: NEGATIVE mg/dL
SPECIFIC GRAVITY, URINE: 1.005 (ref 1.005–1.030)
pH: 8 (ref 5.0–8.0)

## 2017-11-22 LAB — POC URINE PREG, ED: Preg Test, Ur: NEGATIVE
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
## 2017-11-22 NOTE — SANE Note (Signed)
Follow-up Phone Call  Patient gives verbal consent for a FNE/SANE follow-up phone call in 48-72 hours: yes Patient's telephone number: Call phone of Melanie Bell [phone] (in charge of group home) Patient gives verbal consent to leave voicemail at the phone number listed above: yes DO NOT CALL between the hours of: after 4pm

## 2017-11-22 NOTE — ED Notes (Signed)
SANE Dawn at bedside and speaking with pt.
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
## 2017-11-22 NOTE — ED Notes (Signed)
Point of contact for pt from group home is [employer] [phone].

## 2017-11-23 ENCOUNTER — Emergency Department: Payer: Medicare Other

## 2017-11-23 ENCOUNTER — Other Ambulatory Visit: Payer: Self-pay

## 2017-11-23 ENCOUNTER — Encounter: Payer: Self-pay | Admitting: Psychiatry

## 2017-11-23 ENCOUNTER — Emergency Department
Admission: EM | Admit: 2017-11-23 | Discharge: 2017-11-23 | Disposition: A | Discharge status: Other Acute Inpatient Hospital | Payer: Medicare Other | Attending: Emergency Medicine | Admitting: Emergency Medicine

## 2017-11-23 ENCOUNTER — Inpatient Hospital Stay
Admission: AD | Admit: 2017-11-23 | Discharge: 2017-12-11 | DRG: 885 | Disposition: A | Discharge status: Home / Self Care | Payer: Medicare Other | Attending: Psychiatry | Admitting: Psychiatry

## 2017-11-23 DIAGNOSIS — F250 Schizoaffective disorder, bipolar type: Secondary | ICD-10-CM | POA: Diagnosis not present

## 2017-11-23 DIAGNOSIS — X781XXA Intentional self-harm by knife, initial encounter: Secondary | ICD-10-CM | POA: Diagnosis present

## 2017-11-23 DIAGNOSIS — Z7289 Other problems related to lifestyle: Secondary | ICD-10-CM | POA: Insufficient documentation

## 2017-11-23 DIAGNOSIS — J45909 Unspecified asthma, uncomplicated: Secondary | ICD-10-CM | POA: Diagnosis present

## 2017-11-23 DIAGNOSIS — K5900 Constipation, unspecified: Secondary | ICD-10-CM | POA: Diagnosis present

## 2017-11-23 DIAGNOSIS — Z87891 Personal history of nicotine dependence: Secondary | ICD-10-CM

## 2017-11-23 DIAGNOSIS — K219 Gastro-esophageal reflux disease without esophagitis: Secondary | ICD-10-CM | POA: Diagnosis present

## 2017-11-23 DIAGNOSIS — N390 Urinary tract infection, site not specified: Secondary | ICD-10-CM | POA: Diagnosis present

## 2017-11-23 DIAGNOSIS — E039 Hypothyroidism, unspecified: Secondary | ICD-10-CM | POA: Diagnosis present

## 2017-11-23 DIAGNOSIS — I10 Essential (primary) hypertension: Secondary | ICD-10-CM | POA: Diagnosis present

## 2017-11-23 DIAGNOSIS — Z818 Family history of other mental and behavioral disorders: Secondary | ICD-10-CM

## 2017-11-23 DIAGNOSIS — F419 Anxiety disorder, unspecified: Secondary | ICD-10-CM | POA: Diagnosis present

## 2017-11-23 DIAGNOSIS — Z79890 Hormone replacement therapy (postmenopausal): Secondary | ICD-10-CM | POA: Diagnosis not present

## 2017-11-23 DIAGNOSIS — IMO0002 Reserved for concepts with insufficient information to code with codable children: Secondary | ICD-10-CM

## 2017-11-23 DIAGNOSIS — Z79899 Other long term (current) drug therapy: Secondary | ICD-10-CM | POA: Diagnosis not present

## 2017-11-23 DIAGNOSIS — E785 Hyperlipidemia, unspecified: Secondary | ICD-10-CM | POA: Diagnosis present

## 2017-11-23 DIAGNOSIS — S81819A Laceration without foreign body, unspecified lower leg, initial encounter: Secondary | ICD-10-CM | POA: Diagnosis present

## 2017-11-23 HISTORY — DX: Reserved for concepts with insufficient information to code with codable children: IMO0002

## 2017-11-23 LAB — ETHANOL

## 2017-11-23 LAB — COMPREHENSIVE METABOLIC PANEL
ALK PHOS: 76 U/L (ref 38–126)
ALT: 7 U/L (ref 0–44)
AST: 16 U/L (ref 15–41)
Albumin: 4 g/dL (ref 3.5–5.0)
Anion gap: 7 (ref 5–15)
BILIRUBIN TOTAL: 0.5 mg/dL (ref 0.3–1.2)
BUN: 9 mg/dL (ref 6–20)
CO2: 22 mmol/L (ref 22–32)
CREATININE: 0.64 mg/dL (ref 0.44–1.00)
Calcium: 9.3 mg/dL (ref 8.9–10.3)
Chloride: 112 mmol/L — ABNORMAL HIGH (ref 98–111)
GFR calc Af Amer: >60 mL/min (ref >60)
Glucose, Bld: 114 mg/dL — ABNORMAL HIGH (ref 70–99)
POTASSIUM: 3.6 mmol/L (ref 3.5–5.1)
Sodium: 141 mmol/L (ref 135–145)
TOTAL PROTEIN: 7.2 g/dL (ref 6.5–8.1)

## 2017-11-23 LAB — URINE DRUG SCREEN, QUALITATIVE (ARMC ONLY)
Amphetamines, Ur Screen: NOT DETECTED
BENZODIAZEPINE, UR SCRN: NOT DETECTED
Cannabinoid 50 Ng, Ur ~~LOC~~: NOT DETECTED
Cocaine Metabolite,Ur ~~LOC~~: NOT DETECTED
MDMA (Ecstasy)Ur Screen: NOT DETECTED
Methadone Scn, Ur: NOT DETECTED
OPIATE, UR SCREEN: NOT DETECTED
PHENCYCLIDINE (PCP) UR S: NOT DETECTED
Tricyclic, Ur Screen: NOT DETECTED

## 2017-11-23 LAB — CBC WITH DIFFERENTIAL/PLATELET
BASOS ABS: 0 10*3/uL (ref 0–0.1)
Basophils Relative: 0 %
EOS PCT: 1 %
Eosinophils Absolute: 0 10*3/uL (ref 0–0.7)
HCT: 37.4 % (ref 35.0–47.0)
Hemoglobin: 12.6 g/dL (ref 12.0–16.0)
Lymphocytes Relative: 16 %
Lymphs Abs: 1.2 10*3/uL (ref 1.0–3.6)
MCH: 30.4 pg (ref 26.0–34.0)
MCHC: 33.8 g/dL (ref 32.0–36.0)
MCV: 90 fL (ref 80.0–100.0)
MONO ABS: 0.6 10*3/uL (ref 0.2–0.9)
Monocytes Relative: 8 %
Neutro Abs: 5.5 10*3/uL (ref 1.4–6.5)
Neutrophils Relative %: 75 %
PLATELETS: 198 10*3/uL (ref 150–440)
RBC: 4.15 MIL/uL (ref 3.80–5.20)
RDW: 13.4 % (ref 11.5–14.5)
WBC: 7.3 10*3/uL (ref 3.6–11.0)

## 2017-11-23 LAB — LITHIUM LEVEL: LITHIUM LVL: 0.54 mmol/L — AB (ref 0.60–1.20)

## 2017-11-23 LAB — SALICYLATE LEVEL: Salicylate Lvl: <7 mg/dL (ref 2.8–30.0)

## 2017-11-23 LAB — ACETAMINOPHEN LEVEL

## 2017-11-23 IMAGING — DX DG ABDOMEN 1V
2 series · 2 of 2 positions shown · non-contrast
Comparison: Radiographs 4 hours ago, chest radiograph [DATE].

CLINICAL DATA: Evaluate for retained foreign body.

EXAM:
ABDOMEN - 1 VIEW

[abdomen kub (1 of 2)]
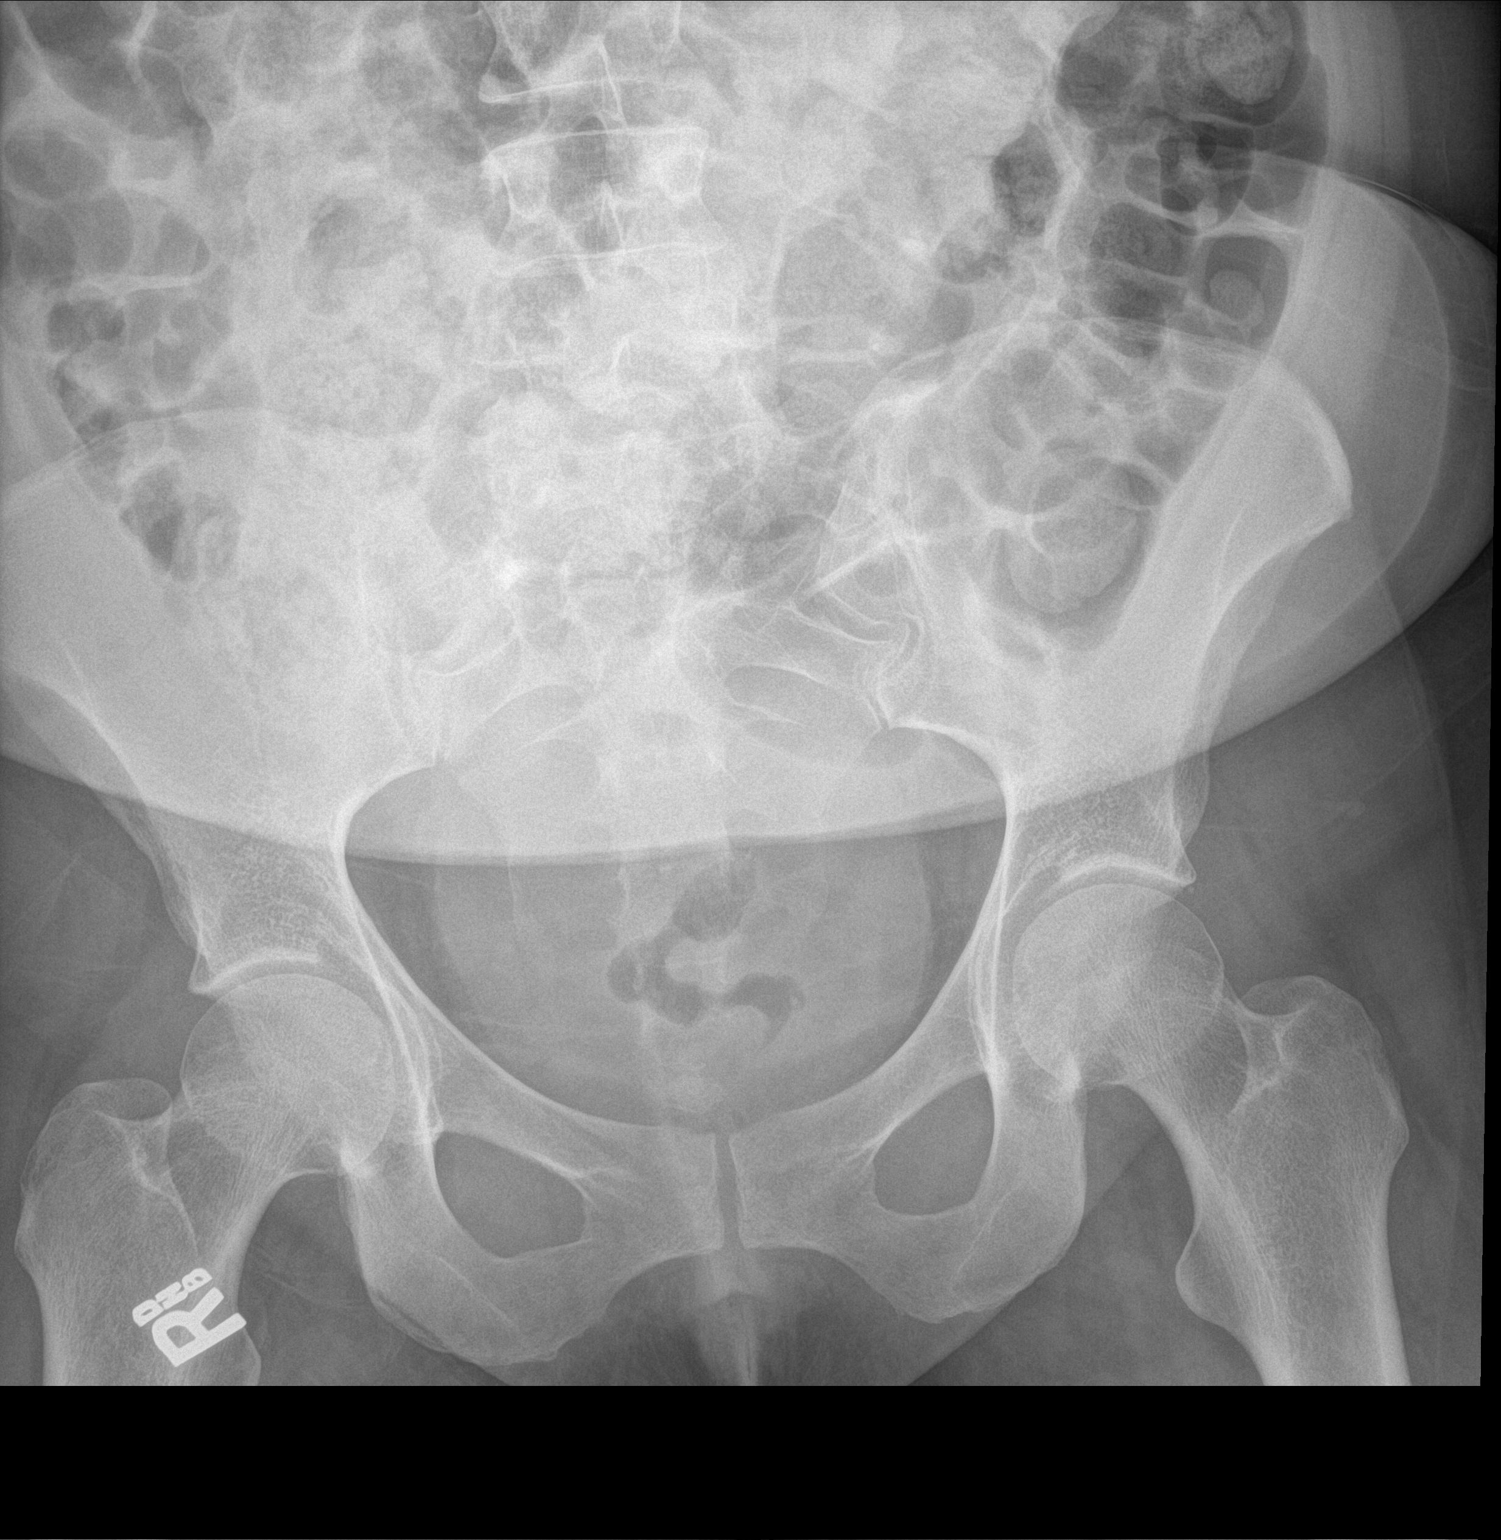

[abdomen kub (2 of 2)]
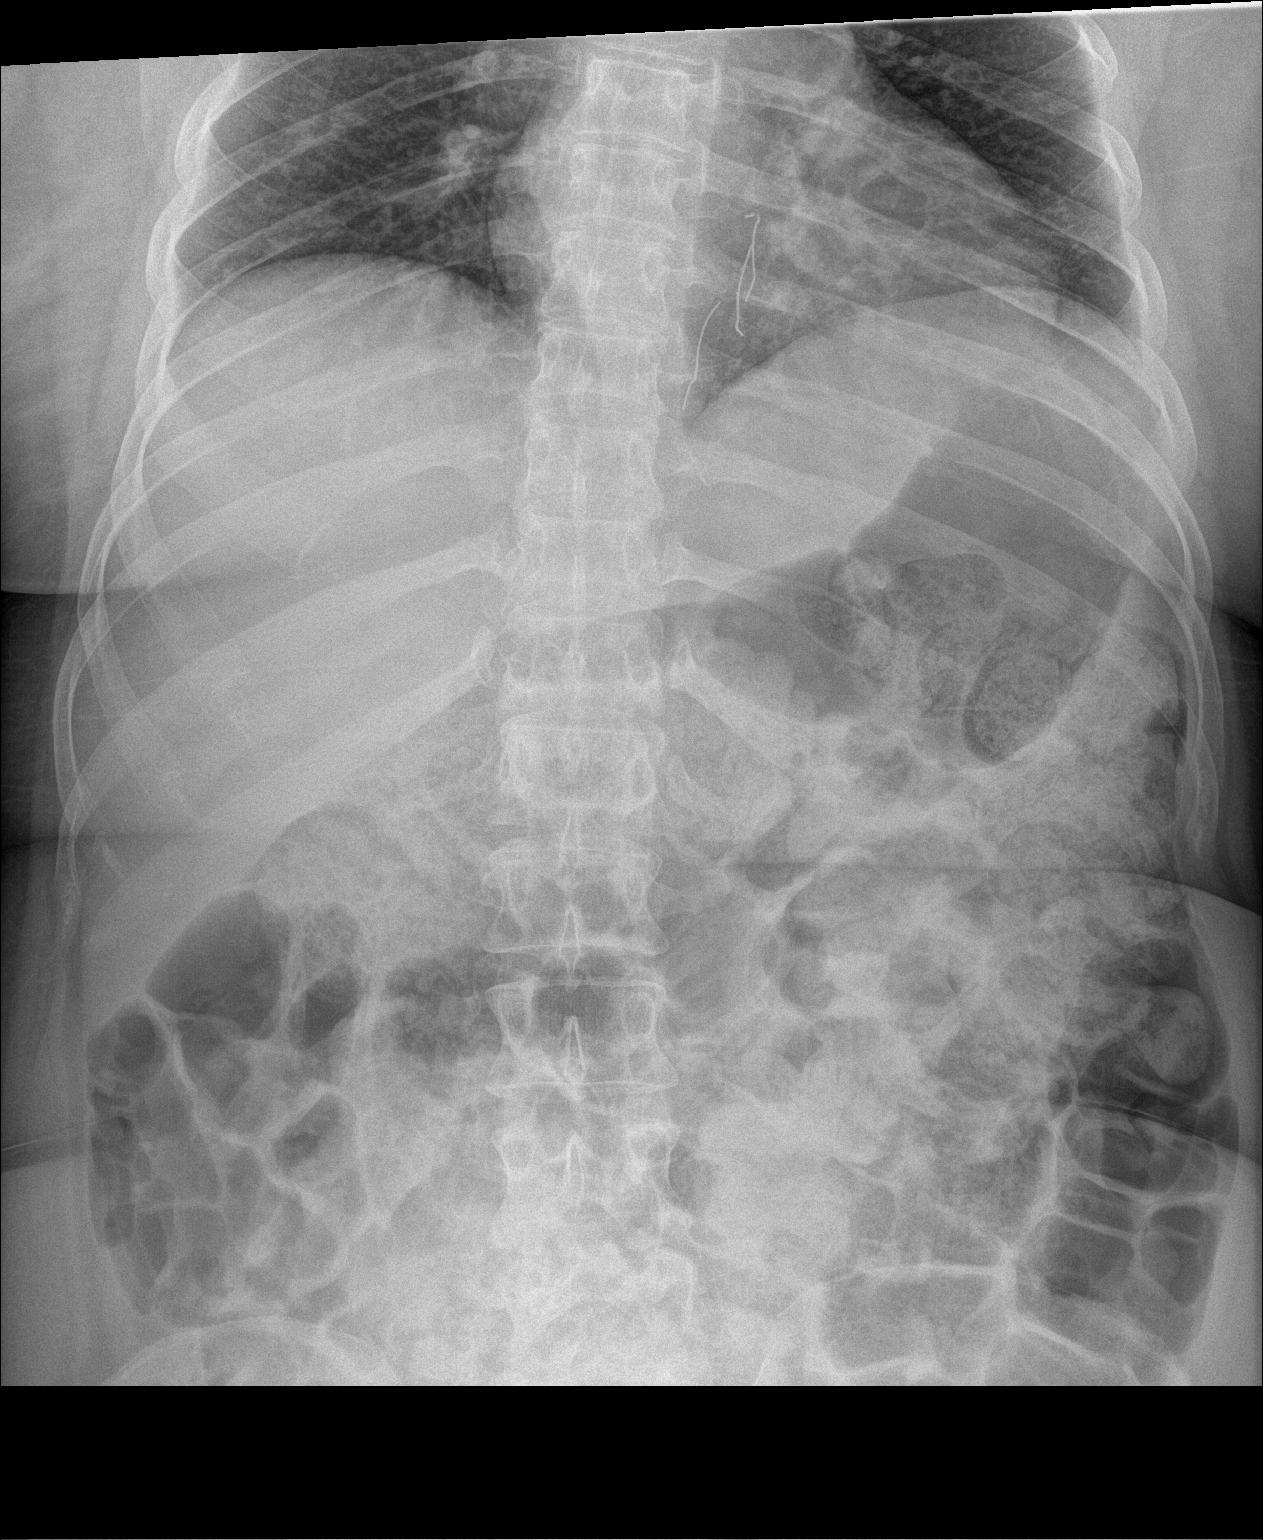

[2 of 2 positions shown; findings below may reference images not displayed]

FINDINGS: Previous foreign bodies overlying the pelvis are no longer seen. No
evidence of free air. Moderate colonic stool burden again seen.

Three linear metallic densities spanning approximately 3 cm each
project over the central lower chest, these were retrospectively
partially included in the field of view.
IMPRESSION: 1. Prior pelvic foreign bodies are no longer seen.
2. Three linear metallic densities project over the central lower
chest, retrospectively partially included in the field of view. This
is new from [DATE] chest radiograph. Recommend correlation for
swallowed foreign body or chest wall foreign body. External artifact
is also considered, however this was partially included in the field
of view on exam 4 hours ago.

## 2017-11-23 MED ORDER — LEVOTHYROXINE SODIUM 75 MCG PO TABS
100.0000 ug | ORAL_TABLET | Freq: Every day | ORAL | Status: DC
  Administered 2017-11-23: 100 ug via ORAL
  Filled 2017-11-23: qty 1

## 2017-11-23 MED ORDER — AMLODIPINE BESYLATE 5 MG PO TABS
2.5000 mg | ORAL_TABLET | Freq: Every day | ORAL | Status: DC
  Administered 2017-11-24 – 2017-12-11 (×16): 2.5 mg via ORAL
  Filled 2017-11-23 (×17): qty 1

## 2017-11-23 MED ORDER — VITAMIN D (ERGOCALCIFEROL) 1.25 MG (50000 UNIT) PO CAPS
50000.0000 [IU] | ORAL_CAPSULE | ORAL | Status: DC
  Administered 2017-12-07: 50000 [IU] via ORAL
  Filled 2017-11-23: qty 1

## 2017-11-23 MED ORDER — TRAZODONE HCL 50 MG PO TABS
50.0000 mg | ORAL_TABLET | Freq: Every day | ORAL | Status: DC
  Administered 2017-11-24 – 2017-12-08 (×15): 50 mg via ORAL
  Filled 2017-11-23 (×15): qty 1

## 2017-11-23 MED ORDER — VENLAFAXINE HCL ER 75 MG PO CP24
150.0000 mg | ORAL_CAPSULE | Freq: Every day | ORAL | Status: DC
  Administered 2017-11-24 – 2017-12-11 (×18): 150 mg via ORAL
  Filled 2017-11-23 (×18): qty 2

## 2017-11-23 MED ORDER — ALUM & MAG HYDROXIDE-SIMETH 200-200-20 MG/5ML PO SUSP: 30.0000 mL | ORAL | Status: DC | PRN

## 2017-11-23 MED ORDER — CETIRIZINE HCL 5 MG/5ML PO SOLN
10.0000 mg | Freq: Two times a day (BID) | ORAL | Status: DC | PRN
  Filled 2017-11-23: qty 10

## 2017-11-23 MED ORDER — LITHIUM CARBONATE ER 300 MG PO TBCR
300.0000 mg | EXTENDED_RELEASE_TABLET | Freq: Two times a day (BID) | ORAL | Status: DC
Start: 2017-11-23 — End: 2017-11-23
  Administered 2017-11-23 (×2): 300 mg via ORAL
  Filled 2017-11-23 (×2): qty 1

## 2017-11-23 MED ORDER — LEVOTHYROXINE SODIUM 100 MCG PO TABS
100.0000 ug | ORAL_TABLET | Freq: Every day | ORAL | Status: DC
  Administered 2017-11-24 – 2017-12-11 (×18): 100 ug via ORAL
  Filled 2017-11-23 (×18): qty 1

## 2017-11-23 MED ORDER — HYDROXYZINE HCL 25 MG PO TABS: 50.0000 mg | ORAL_TABLET | Freq: Four times a day (QID) | ORAL | Status: DC | PRN

## 2017-11-23 MED ORDER — CLOZAPINE 100 MG PO TABS
150.0000 mg | ORAL_TABLET | Freq: Every day | ORAL | Status: DC
  Administered 2017-11-23: 150 mg via ORAL
  Filled 2017-11-23: qty 2

## 2017-11-23 MED ORDER — VENLAFAXINE HCL ER 75 MG PO CP24
150.0000 mg | ORAL_CAPSULE | Freq: Every day | ORAL | Status: DC
  Administered 2017-11-23: 150 mg via ORAL

## 2017-11-23 MED ORDER — HYDROXYZINE HCL 50 MG PO TABS
50.0000 mg | ORAL_TABLET | Freq: Four times a day (QID) | ORAL | Status: DC | PRN
  Administered 2017-11-23: 50 mg via ORAL
  Filled 2017-11-23: qty 1

## 2017-11-23 MED ORDER — PANTOPRAZOLE SODIUM 40 MG PO TBEC
40.0000 mg | DELAYED_RELEASE_TABLET | Freq: Every day | ORAL | Status: DC
  Administered 2017-11-24 – 2017-12-11 (×18): 40 mg via ORAL
  Filled 2017-11-23 (×18): qty 1

## 2017-11-23 MED ORDER — PANTOPRAZOLE SODIUM 40 MG PO TBEC
40.0000 mg | DELAYED_RELEASE_TABLET | Freq: Every day | ORAL | Status: DC
  Administered 2017-11-23: 40 mg via ORAL
  Filled 2017-11-23 (×2): qty 1

## 2017-11-23 MED ORDER — ACETAMINOPHEN 325 MG PO TABS
650.0000 mg | ORAL_TABLET | Freq: Four times a day (QID) | ORAL | Status: DC | PRN
  Administered 2017-11-23 – 2017-12-03 (×4): 650 mg via ORAL
  Filled 2017-11-23 (×4): qty 2

## 2017-11-23 MED ORDER — CLONIDINE HCL 0.1 MG PO TABS: 0.1000 mg | ORAL_TABLET | Freq: Two times a day (BID) | ORAL | Status: DC | PRN

## 2017-11-23 MED ORDER — OXCARBAZEPINE 150 MG PO TABS
150.0000 mg | ORAL_TABLET | Freq: Two times a day (BID) | ORAL | Status: DC
  Administered 2017-11-24 – 2017-12-11 (×35): 150 mg via ORAL
  Filled 2017-11-23 (×37): qty 1

## 2017-11-23 MED ORDER — LITHIUM CARBONATE ER 300 MG PO TBCR
300.0000 mg | EXTENDED_RELEASE_TABLET | Freq: Two times a day (BID) | ORAL | Status: DC
  Administered 2017-11-24 – 2017-12-11 (×35): 300 mg via ORAL
  Filled 2017-11-23 (×30): qty 1

## 2017-11-23 MED ORDER — POLYETHYLENE GLYCOL 3350 17 G PO PACK
17.0000 g | PACK | Freq: Every day | ORAL | Status: DC | PRN
  Administered 2017-11-30 – 2017-12-10 (×3): 17 g via ORAL
  Filled 2017-11-23 (×3): qty 1

## 2017-11-23 MED ORDER — MAGNESIUM HYDROXIDE 400 MG/5ML PO SUSP: 30.0000 mL | Freq: Every day | ORAL | Status: DC | PRN

## 2017-11-23 MED ORDER — HYDROXYZINE HCL 50 MG PO TABS: 50.0000 mg | ORAL_TABLET | Freq: Three times a day (TID) | ORAL | Status: DC | PRN

## 2017-11-23 MED ORDER — SENNA 8.6 MG PO TABS
1.0000 | ORAL_TABLET | Freq: Every day | ORAL | Status: DC | PRN
  Filled 2017-11-23: qty 1

## 2017-11-23 MED ORDER — TRAZODONE HCL 50 MG PO TABS
50.0000 mg | ORAL_TABLET | Freq: Every day | ORAL | Status: DC
  Administered 2017-11-23: 50 mg via ORAL
  Filled 2017-11-23: qty 1

## 2017-11-23 MED ORDER — VITAMIN D (ERGOCALCIFEROL) 1.25 MG (50000 UNIT) PO CAPS
50000.0000 [IU] | ORAL_CAPSULE | ORAL | Status: DC
  Administered 2017-11-23: 50000 [IU] via ORAL
  Filled 2017-11-23: qty 1

## 2017-11-23 MED ORDER — VENLAFAXINE HCL ER 75 MG PO CP24
150.0000 mg | ORAL_CAPSULE | Freq: Every day | ORAL | Status: DC
  Filled 2017-11-23 (×2): qty 2

## 2017-11-23 MED ORDER — SENNA 8.6 MG PO TABS: 1.0000 | ORAL_TABLET | Freq: Every day | ORAL | Status: DC | PRN

## 2017-11-23 MED ORDER — OXCARBAZEPINE 150 MG PO TABS
150.0000 mg | ORAL_TABLET | Freq: Two times a day (BID) | ORAL | Status: DC
  Administered 2017-11-23 (×2): 150 mg via ORAL
  Filled 2017-11-23 (×3): qty 1

## 2017-11-23 MED ORDER — BACITRACIN-NEOMYCIN-POLYMYXIN OINTMENT TUBE
TOPICAL_OINTMENT | Freq: Every day | CUTANEOUS | Status: DC
  Administered 2017-11-23 – 2017-12-03 (×6): via TOPICAL
  Filled 2017-11-23: qty 14.17

## 2017-11-23 MED ORDER — POLYETHYLENE GLYCOL 3350 17 G PO PACK: 17.0000 g | PACK | Freq: Every day | ORAL | Status: DC | PRN

## 2017-11-23 MED ORDER — BACITRACIN ZINC 500 UNIT/GM EX OINT
TOPICAL_OINTMENT | CUTANEOUS | Status: AC
  Administered 2017-11-23: 03:00:00 via TOPICAL
  Filled 2017-11-23: qty 1.8

## 2017-11-23 MED ORDER — AMLODIPINE BESYLATE 5 MG PO TABS
2.5000 mg | ORAL_TABLET | Freq: Every day | ORAL | Status: DC
  Administered 2017-11-23: 2.5 mg via ORAL
  Filled 2017-11-23: qty 1

## 2017-11-23 MED ORDER — CLOZAPINE 25 MG PO TABS
150.0000 mg | ORAL_TABLET | Freq: Every day | ORAL | Status: DC
  Administered 2017-11-24 – 2017-12-10 (×17): 150 mg via ORAL
  Filled 2017-11-23 (×17): qty 1

## 2017-11-23 NOTE — ED Notes (Signed)
Report to include Situation, Background, Assessment, and Recommendations received from [facility]. Patient alert and oriented, warm and dry, in no acute distress. Patient denies SI, HI, AVH and pain. Patient made aware of Q15 minute rounds and security cameras for their safety. Patient instructed to come to me with needs or concerns.

## 2017-11-23 NOTE — ED Notes (Signed)
Pt changed into scrubs and belongings secured. Pants, shirt, bra, underwear and sandals. Rubber bracelet with Bible verse on it.
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
## 2017-11-23 NOTE — ED Notes (Signed)
IVC/ SOC completed recommend Inpt Admit/ Pending placement

## 2017-11-23 NOTE — ED Notes (Signed)

## 2017-11-23 NOTE — ED Notes (Signed)
Pt right calf was bleeding. This writer cleaned area and placed a bandage over self inflicted cut.
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
## 2017-11-23 NOTE — Clinical Social Work Note (Signed)
CSW received call from patient's [facility] ACTT Team Lead Melanie Bell [phone]) inquiring on patient status. CSW informed Ms. Williams patient is being followed by TTS and Ms. Williams aware of TTS contact information.   Oretha Ellis, Latanya Presser, [place] Worker-Emergency Department [phone]

## 2017-11-23 NOTE — BH Assessment (Signed)
Dr. Prescott Gum recommends inpatient psychiatric treatment. Pending bed assignment on BMU.

## 2017-11-23 NOTE — ED Notes (Signed)
Wounds cleaned and bacitracin applied. No dressings or steri strips applied due to pt condition of ingesting foreign objects.
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
## 2017-11-23 NOTE — ED Notes (Addendum)
Labs drawn by this EDT. Patient given warm blanket for comfort. This EDT to room 23 for 1:1 sitting for patients safety.
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
## 2017-11-23 NOTE — ED Notes (Signed)
Pt. To BHU from ED ambulatory without difficulty, to room  BHU5. Report from [facility]. Pt. Is alert and oriented, warm and dry in no distress. Pt. Denies SI, HI, and AVH. Pt. Calm and cooperative. Pt states she is not SI but has feeling of wanting to hurt self. Patient contracts for safety with this Probation officer. Pt. Made aware of security cameras and Q15 minute rounds. Pt. Encouraged to let Nursing staff know of any concerns or needs.
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
## 2017-11-23 NOTE — ED Notes (Signed)
Hourly rounding reveals patient sleeping in room. No complaints, stable, in no acute distress. Q15 minute rounds and monitoring via Security Cameras to continue. 
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
## 2017-11-23 NOTE — Progress Notes (Deleted)
Patient's ANC of 5.5 K/uL for 11/23/17 was reported to the [facility] program using their website.
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
## 2017-11-23 NOTE — BH Assessment (Signed)
Patient is to be admitted to [facility] by Dr. Weber Cooks.  Attending Physician will be Dr. Bary Leriche.   Patient has been assigned to room 322, by [place].    ER staff is aware of the admission:  [facility] ER Sectary   [id number] called by TTS to inform ER MD-no answer  Dominica Severin Patient's Nurse   [facility] Patient Access.

## 2017-11-24 LAB — TSH: TSH: 1.07 u[IU]/mL (ref 0.350–4.500)

## 2017-11-24 LAB — LIPID PANEL
CHOL/HDL RATIO: 4.2 ratio
CHOLESTEROL: 173 mg/dL (ref 0–200)
HDL: 41 mg/dL (ref >40)
LDL Cholesterol: 117 mg/dL — ABNORMAL HIGH (ref 0–99)
Triglycerides: 77 mg/dL (ref <150)
VLDL: 15 mg/dL (ref 0–40)

## 2017-11-24 LAB — HEMOGLOBIN A1C
Hgb A1c MFr Bld: 5.1 % (ref 4.8–5.6)
MEAN PLASMA GLUCOSE: 99.67 mg/dL

## 2017-11-24 NOTE — Progress Notes (Signed)
[facility] MD Progress Note  11/25/2017 1:10 PM Melanie Bell  MRN:  [id number]  Subjective:    [name] has no complaints today. She denies symptoms of depression anxiety or psychosis. She tolerates medications well. No behavioral problems.  Attempted to call her mother who lives in [place] now with no success. The patient tells me that her mom has a new phone number that is known to the group home staff.  Principal Problem: Schizoaffective disorder, bipolar type ([place]) Diagnosis:   Patient Active Problem List   Diagnosis Date Noted  . Schizoaffective disorder, bipolar type ([place]) [F25.0] 11/06/2014    Priority: High  . Self-inflicted injury [E02.23] 11/23/2017  . ASCUS of cervix with negative high risk HPV [R87.610] 08/28/2017  . Malingering [Z76.5] 05/06/2016  . Foreign body aspiration [T17.900A]   . Other foreign object in esophagus causing other injury, subsequent encounter [T18.198D]   . Swallowed foreign body [T18.9XXA] 04/23/2016  . Foreign body ingestion [T18.9XXA] 04/21/2016  . Gastric foreign body [T18.2XXA]   . 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 Tobacco use disorder [F17.200] 09/30/2014  . Hypothyroidism [E03.9] 09/29/2014  . Hypertension [I10] 09/29/2014   Total Time spent with patient: 20 minutes  Past Psychiatric History: schizoaffective disorder  Past Medical History:  Past Medical History:  Diagnosis Date  . Anxiety   . Asthma   . Depression   . GERD (gastroesophageal reflux disease)   . Hallucinations 09/30/2014   Sizoaffective  . Hyperlipidemia   . Hypertension   . Intentional ingestion of batteries 04/21/2016    2 AAA batteries and 1 thumb tack; intent to hurt herself/notes 04/21/2016  . Tardive dyskinesia 10/2014   recent onset    Past Surgical History:  Procedure Laterality Date  . ABDOMINAL SURGERY     "years ago" to remove foreign objects  . APPENDECTOMY    . BREAST EXCISIONAL BIOPSY Right 09/03/2007   surgical bx removed fibroadeoma  .  BREAST LUMPECTOMY Right   . COLONOSCOPY WITH PROPOFOL N/A 09/10/2015   Procedure: COLONOSCOPY WITH PROPOFOL;  Surgeon: [name], MD;  Location: [facility] ENDOSCOPY;  Service: Endoscopy;  Laterality: N/A;  . ESOPHAGOGASTRODUODENOSCOPY N/A 11/28/2014   Procedure: ESOPHAGOGASTRODUODENOSCOPY (EGD);  Surgeon: [name], MD;  Location: [facility] ENDOSCOPY;  Service: Endoscopy;  Laterality: N/A;  . ESOPHAGOGASTRODUODENOSCOPY N/A 02/21/2016   Procedure: ESOPHAGOGASTRODUODENOSCOPY (EGD);  Surgeon: Mauri Pole, MD;  Location: [facility] ENDOSCOPY;  Service: Endoscopy;  Laterality: N/A;  . ESOPHAGOGASTRODUODENOSCOPY N/A 04/21/2016   Procedure: ESOPHAGOGASTRODUODENOSCOPY (EGD);  Surgeon: Gatha Mayer, MD;  Location: [facility] ENDOSCOPY;  Service: Endoscopy;  Laterality: N/A;  . ESOPHAGOGASTRODUODENOSCOPY N/A 05/06/2016   Procedure: ESOPHAGOGASTRODUODENOSCOPY (EGD);  Surgeon: Jonathon Bellows, MD;  Location: [facility] ENDOSCOPY;  Service: Gastroenterology;  Laterality: N/A;  . ESOPHAGOGASTRODUODENOSCOPY N/A 06/13/2016   Procedure: ESOPHAGOGASTRODUODENOSCOPY (EGD);  Surgeon: [name], MD;  Location: [facility] ENDOSCOPY;  Service: Endoscopy;  Laterality: N/A;  . ESOPHAGOGASTRODUODENOSCOPY (EGD) WITH PROPOFOL N/A 02/29/2016   Procedure: ESOPHAGOGASTRODUODENOSCOPY (EGD) WITH PROPOFOL;  Surgeon: [name], MD;  Location: [facility] ENDOSCOPY;  Service: Endoscopy;  Laterality: N/A;  . LAPAROTOMY N/A 09/12/2015   Procedure: EXPLORATORY LAPAROTOMY;  Surgeon: Florene Glen, MD;  Location: [facility] ORS;  Service: General;  Laterality: N/A;  . SIGMOIDOSCOPY N/A 09/12/2015   Procedure: Lonell Face;  Surgeon: Florene Glen, MD;  Location: [facility] ORS;  Service: General;  Laterality: N/A;  . WISDOM TOOTH EXTRACTION     Family History:  Family History  Problem Relation Age of Onset  . Depression Mother   .  Hypertension Mother   . Sleep apnea Mother   . Asthma Mother   . COPD Mother   . Diabetes Mother   . Breast cancer Maternal Aunt         20's   Family Psychiatric  History: depression Social History:  Social History   Substance and Sexual Activity  Alcohol Use No     Social History   Substance and Sexual Activity  Drug Use No    Social History   Socioeconomic History  . Marital status: Single    Spouse name: Not on file  . Number of children: Not on file  . Years of education: Not on file  . Highest education level: Not on file  Occupational History  . Not on file  Social Needs  . Financial resource strain: Not on file  . Food insecurity:    Worry: Not on file    Inability: Not on file  . Transportation needs:    Medical: Not on file    Non-medical: Not on file  Tobacco Use  . Smoking status: Former Smoker    Packs/day: 0.50    Years: 3.00    Pack years: 1.50    Types: Cigarettes    Last attempt to quit: 08/2016    Years since quitting: 1.2  . Smokeless tobacco: Never Used  Substance and Sexual Activity  . Alcohol use: No  . Drug use: No  . Sexual activity: Never    Birth control/protection: Injection  Lifestyle  . Physical activity:    Days per week: Not on file    Minutes per session: Not on file  . Stress: Not on file  Relationships  . Social connections:    Talks on phone: Not on file    Gets together: Not on file    Attends religious service: Not on file    Active member of club or organization: Not on file    Attends meetings of clubs or organizations: Not on file    Relationship status: Not on file  Other Topics Concern  . Not on file  Social History Narrative  . Not on file   Additional Social History:                         Sleep: Fair  Appetite:  Fair  Current Medications: Current Facility-Administered Medications  Medication Dose Route Frequency Provider Last Rate Last Dose  . acetaminophen (TYLENOL) tablet 650 mg  650 mg Oral Q6H PRN [name], [name], MD   650 mg at 11/24/17 1710  . alum & mag hydroxide-simeth (MAALOX/MYLANTA) 200-200-20 MG/5ML  suspension 30 mL  30 mL Oral Q4H PRN [name], [name], MD      . amLODipine (NORVASC) tablet 2.5 mg  2.5 mg Oral Daily [name], [name], MD   2.5 mg at 11/25/17 0917  . cetirizine HCl (Zyrtec) 5 MG/5ML solution 10 mg  10 mg Oral BID PRN [name], [name], MD      . cloNIDine (CATAPRES) tablet 0.1 mg  0.1 mg Oral BID PRN Madyson Lukach B, MD      . cloZAPine (CLOZARIL) tablet 150 mg  150 mg Oral QHS [name], [name], MD   150 mg at 11/24/17 2130  . hydrOXYzine (ATARAX/VISTARIL) tablet 50 mg  50 mg Oral Q6H PRN [name], [name], MD   50 mg at 11/23/17 2333  . levothyroxine (SYNTHROID, LEVOTHROID) tablet 100 mcg  100 mcg Oral QAC breakfast [name], John  T, MD   100 mcg at 11/25/17 0917  . lithium carbonate (LITHOBID) CR tablet 300 mg  300 mg Oral BID WC [name], [name], MD   300 mg at 11/25/17 0916  . magnesium hydroxide (MILK OF MAGNESIA) suspension 30 mL  30 mL Oral Daily PRN [name], [name], MD      . neomycin-bacitracin-polymyxin (NEOSPORIN) ointment   Topical Daily Josealfredo Adkins B, MD      . OXcarbazepine (TRILEPTAL) tablet 150 mg  150 mg Oral BID [name], [name], MD   150 mg at 11/25/17 0917  . pantoprazole (PROTONIX) EC tablet 40 mg  40 mg Oral Daily [name], [name], MD   40 mg at 11/25/17 0917  . polyethylene glycol (MIRALAX / GLYCOLAX) packet 17 g  17 g Oral Daily PRN [name], [name], MD      . senna (SENOKOT) tablet 8.6 mg  1 tablet Oral Daily PRN [name], [name], MD      . traZODone (DESYREL) tablet 50 mg  50 mg Oral QHS [name], [name], MD   50 mg at 11/24/17 2130  . venlafaxine XR (EFFEXOR-XR) 24 hr capsule 150 mg  150 mg Oral Daily [name], [name], MD   150 mg at 11/25/17 0917  . [START ON 12/07/2017] Vitamin D (Ergocalciferol) (DRISDOL) capsule 50,000 Units  50,000 Units Oral Weekly [name], [name], MD        Lab Results:  No results found for this or any previous visit (from the past 48 hour(s)).  Blood Alcohol level:  Lab Results  Component Value Date   ETH <10 11/23/2017    ETH <5 18/84/1660    Metabolic Disorder Labs: Lab Results  Component Value Date   HGBA1C 5.1 11/23/2017   MPG 99.67 11/23/2017   MPG 103 03/03/2016   Lab Results  Component Value Date   PROLACTIN 64.6 (H) 03/04/2016   PROLACTIN 15.8 06/16/2015   Lab Results  Component Value Date   CHOL 173 11/23/2017   TRIG 77 11/23/2017   HDL 41 11/23/2017   CHOLHDL 4.2 11/23/2017   VLDL 15 11/23/2017   LDLCALC 117 (H) 11/23/2017   LDLCALC 127 (H) 03/03/2016    Physical Findings: AIMS:  , ,  ,  ,    CIWA:    COWS:     Musculoskeletal: Strength & Muscle Tone: within normal limits Gait & Station: normal Patient leans: N/A  Psychiatric Specialty Exam: Physical Exam  Nursing note and vitals reviewed. Psychiatric: She has a normal mood and affect. Her speech is normal and behavior is normal. Thought content normal. Cognition and memory are normal.    Review of Systems  Neurological: Negative.   Psychiatric/Behavioral: Negative.   All other systems reviewed and are negative.   Blood pressure 107/64, pulse 85, temperature 98.8 F (37.1 C), temperature source Oral, resp. rate 18, height 5\' 6"  (1.676 m), weight 97.1 kg (214 lb), SpO2 100 %.Body mass index is 34.54 kg/m.  General Appearance: Casual  Eye Contact:  Good  Speech:  Clear and Coherent  Volume:  Normal  Mood:  Euthymic  Affect:  Flat  Thought Process:  Goal Directed and Descriptions of Associations: Intact  Orientation:  Full (Time, Place, and Person)  Thought Content:  WDL  Suicidal Thoughts:  No  Homicidal Thoughts:  No  Memory:  Immediate;   Fair Recent;   Fair Remote;   Fair  Judgement:  Poor  Insight:  Lacking  Psychomotor Activity:  Normal  Concentration:  Concentration: Fair and Attention Span: Fair  Recall:  [employer] of Knowledge:  Fair  Language:  Fair  Akathisia:  No  Handed:  Right  AIMS (if indicated):     Assets:  Communication Skills Desire for Improvement Financial  Resources/Insurance Physical Health Resilience  ADL's:  Intact  Cognition:  WNL  Sleep:  Number of Hours: 6.45     Treatment Plan Summary: Daily contact with patient to assess and evaluate symptoms and progress in treatment and Medication management   Melanie Bell is a 27 year old female with a history of schizoaffective disorder and self injurious behavior admitted after suicidal gesture by cutting her arm with a steak knife in the context of losing her place at the group home.   #Suicidal ideation -patient is able to contract for safety in the hospital  #Mood/psychosis -continue Clozapine 150 mg nightly -continue Lithium 300 mg BID, Li 0.54 -Trileptal 150 mg BID -Effexor 150 mg daily  #HTN, BP low -stop Lisinopril 2.5 mg daily -Clonidine 0.1 mg BIR PRN  #Hypothyroidism -Synthroid 100 ug daily  #GERD -Protonix 40 mg daily  #Constipation -bowel regimen  #Skin cuts -Neosporin  #Social -incompetent adult -mother the guardian  #Labs -lipid panel, TSH and A1C -EKG -pregnancy test is negative  #Disposition -discharge to her group home -follow up with [facility] ACT team  12/05/2017 No medications changes    Orson Slick, MD 11/25/2017, 1:10 PM

## 2017-11-24 NOTE — Progress Notes (Signed)
Recreation Therapy Notes  INPATIENT RECREATION THERAPY ASSESSMENT  Patient Details [name] MRN: [id number] DOB: [birth year] Today's Date: 11/24/2017       Information Obtained From: Patient  Able to Participate in Assessment/Interview: Yes  Patient Presentation: Responsive  Reason for Admission (Per Patient): Self-injurious Behavior, Suicide Attempt  Patient Stressors: (The group home staff)  Coping Skills:   Journal, Music, Art  Leisure Interests (2+):  Exercise - Walking, Individual - Reading, Art Chief of Staff, Music - Listen  Frequency of Recreation/Participation: Monthly  Awareness of Community Resources:  Yes  Community Resources:  [employer], Art therapist  Current Use: Yes  If no, Barriers?:    Expressed Interest in [place] of Residence:  Insurance underwriter  Patient Main Form of Transportation: Walk  Patient Strengths:  Caring, smart  Patient Identified Areas of Improvement:  My anger and impulse  Patient Goal for Hospitalization:  Control impulse and anger  Current SI (including self-harm):  No  Current HI:  No  Current AVH: No  Staff Intervention Plan: Group Attendance, Collaborate with Interdisciplinary Treatment Team  Consent to Intern Participation: N/A  Melanie Bell 11/24/2017, 1:58 PM

## 2017-11-24 NOTE — Tx Team (Signed)
Initial Treatment Plan 11/24/2017 1:58 AM Melanie Bell EQA:[id number]    PATIENT STRESSORS: Traumatic event Other: Uncontrolled Anger Displacement aeb Self Inflicting Wounds   PATIENT STRENGTHS: Ability for insight Motivation for treatment/growth   PATIENT IDENTIFIED PROBLEMS: Mood Instability  Self Inflicted Wounds/SIB/PICA/Intentionally ingesting Foreign Objects  Ineffective Copping Skills  Anger Displaced to Self/Aggiation               DISCHARGE CRITERIA:  Improved stabilization in mood, thinking, and/or behavior Motivation to continue treatment in a less acute level of care Reduction of life-threatening or endangering symptoms to within safe limits Safe-care adequate arrangements made Verbal commitment to aftercare and medication compliance  PRELIMINARY DISCHARGE PLAN: Outpatient therapy Return to previous living arrangement  PATIENT/FAMILY INVOLVEMENT: This treatment plan has been presented to and reviewed with the patient, Melanie Bell.  The patient and family have been given the opportunity to ask questions and make suggestions.  Electa Sniff, RN 11/24/2017, 1:58 AM

## 2017-11-24 NOTE — [facility] Suicide Risk Assessment (Signed)
[place] INPATIENT:  Family/Significant Other Suicide Prevention Education  Suicide Prevention Education:  Contact Attempts: Melanie Bell, Patients mother/guardian, [phone],  has been identified by the patient as the family member/significant other with whom the patient will be residing, and identified as the person(s) who will aid the patient in the event of a mental health crisis.  With written consent from the patient, two attempts were made to provide suicide prevention education, prior to and/or following the patient's discharge.  We were unsuccessful in providing suicide prevention education.  A suicide education pamphlet was given to the patient to share with family/significant other.  Date and time of first attempt: CSW attempted to contact the patients family member/ significant other to complete SPE and obtain collateral information. CSW left a voicemail with a callback number on 11/24/2017 2:05 PM  Date and time of second attempt:  Melanie Bell 11/24/2017, 2:05 PM

## 2017-11-24 NOTE — Progress Notes (Signed)
Patient ID: Melanie Bell, female   DOB: 23-Feb-1991, 27 y.o.   MRN: [id number] PER STATE REGULATIONS 482.30  THIS CHART WAS REVIEWED FOR MEDICAL NECESSITY WITH RESPECT TO THE PATIENT'S ADMISSION/DURATION OF STAY.  NEXT REVIEW [id number]  Roma Schanz, RN, BSN CASE MANAGER

## 2017-11-24 NOTE — Tx Team (Addendum)
Interdisciplinary Treatment and Diagnostic Plan Update  11/24/2017 Time of Session: 10:30am Melanie Bell MRN: [id number]  Principal Diagnosis: Schizoaffective disorder, bipolar type [facility])  Secondary Diagnoses: Principal Problem:   Schizoaffective disorder, bipolar type ([place]) Active Problems:   Hypothyroidism   Hypertension   Self-inflicted injury   Current Medications:  Current Facility-Administered Medications  Medication Dose Route Frequency Provider Last Rate Last Dose  . acetaminophen (TYLENOL) tablet 650 mg  650 mg Oral Q6H PRN [name], [name], MD   650 mg at 11/23/17 2333  . alum & mag hydroxide-simeth (MAALOX/MYLANTA) 200-200-20 MG/5ML suspension 30 mL  30 mL Oral Q4H PRN [name], [name], MD      . amLODipine (NORVASC) tablet 2.5 mg  2.5 mg Oral Daily [name], [name], MD   2.5 mg at 11/24/17 0829  . cetirizine HCl (Zyrtec) 5 MG/5ML solution 10 mg  10 mg Oral BID PRN [name], [name], MD      . cloNIDine (CATAPRES) tablet 0.1 mg  0.1 mg Oral BID PRN [name], [name], MD      . cloZAPine (CLOZARIL) tablet 150 mg  150 mg Oral QHS [name], [name], MD      . hydrOXYzine (ATARAX/VISTARIL) tablet 50 mg  50 mg Oral Q6H PRN [name], [name], MD   50 mg at 11/23/17 2333  . levothyroxine (SYNTHROID, LEVOTHROID) tablet 100 mcg  100 mcg Oral QAC breakfast [name], [name], MD   100 mcg at 11/24/17 0830  . lithium carbonate (LITHOBID) CR tablet 300 mg  300 mg Oral BID WC [name], [name], MD   300 mg at 11/24/17 0829  . magnesium hydroxide (MILK OF MAGNESIA) suspension 30 mL  30 mL Oral Daily PRN [name], [name], MD      . neomycin-bacitracin-polymyxin (NEOSPORIN) ointment   Topical Daily [name], [name], MD      . OXcarbazepine (TRILEPTAL) tablet 150 mg  150 mg Oral BID [name], [name], MD      . pantoprazole (PROTONIX) EC tablet 40 mg  40 mg Oral Daily [name], [name], MD   40 mg at 11/24/17 0829  . polyethylene glycol (MIRALAX / GLYCOLAX) packet 17 g  17 g Oral Daily PRN  [name], [name], MD      . senna (SENOKOT) tablet 8.6 mg  1 tablet Oral Daily PRN [name], [name], MD      . traZODone (DESYREL) tablet 50 mg  50 mg Oral QHS [name], [name], MD      . venlafaxine XR (EFFEXOR-XR) 24 hr capsule 150 mg  150 mg Oral Daily [name], [name], MD   150 mg at 11/24/17 0829  . [START ON 12/07/2017] Vitamin D (Ergocalciferol) (DRISDOL) capsule 50,000 Units  50,000 Units Oral Weekly [name], [name], MD       PTA Medications: Medications Prior to Admission  Medication Sig Dispense Refill Last Dose  . amLODipine (NORVASC) 2.5 MG tablet Take 1 tablet (2.5 mg total) by mouth daily. 30 tablet 0 11/22/2017 at Unknown time  . cetirizine (ZYRTEC) 10 MG tablet Take 10 mg by mouth daily as needed for allergies.   prn at prn  . clozapine (CLOZARIL) 50 MG tablet Take 150 mg by mouth.   11/22/2017 at Unknown time  . hydrOXYzine (ATARAX/VISTARIL) 50 MG tablet Take 50 mg by mouth 2 (two) times daily as needed.   prn at prn  . levothyroxine (SYNTHROID, LEVOTHROID) 100 MCG tablet Take 100 mcg by mouth daily before breakfast.  11/22/2017 at Unknown time  . lithium carbonate 300 MG capsule Take 300 mg by mouth 2 (two) times daily with a meal.    11/22/2017 at Unknown time  . medroxyPROGESTERone (DEPO-PROVERA) 150 MG/ML injection Inject 150 mg into the muscle every 3 (three) months.   unknowbn at unknown  . omeprazole (PRILOSEC) 20 MG capsule Take 20 mg by mouth daily.   11/22/2017 at Unknown time  . OXcarbazepine (TRILEPTAL) 150 MG tablet Take 1 tablet (150 mg total) by mouth 2 (two) times daily. (Patient taking differently: Take 300 mg by mouth 2 (two) times daily. ) 60 tablet 0 11/22/2017 at Unknown time  . polyethylene glycol (MIRALAX / GLYCOLAX) packet Take 17 g by mouth daily as needed.   prn at prn  . senna (SENOKOT) 8.6 MG TABS tablet Take 1 tablet by mouth daily as needed for mild constipation.   prn at prn  . traZODone (DESYREL) 50 MG tablet Take 50 mg by mouth at bedtime.   prn at prn  .  venlafaxine XR (EFFEXOR-XR) 150 MG 24 hr capsule Take 1 capsule (150 mg total) by mouth daily with breakfast. 30 capsule 0 11/22/2017 at Unknown time  . Vitamin D, Ergocalciferol, (DRISDOL) 50000 units CAPS capsule Take 50,000 Units by mouth every 7 (seven) days.   Past Week at Unknown time    Patient Stressors: Traumatic event Other: Uncontrolled Anger Displacement aeb Self Inflicting Wounds  Patient Strengths: Ability for insight Motivation for treatment/growth  Treatment Modalities: Medication Management, Group therapy, Case management,  1 to 1 session with clinician, Psychoeducation, Recreational therapy.   Physician Treatment Plan for Primary Diagnosis: Schizoaffective disorder, bipolar type ([place]) Long Term Goal(s): Improvement in symptoms so as ready for discharge Improvement in symptoms so as ready for discharge   Short Term Goals: Ability to identify changes in lifestyle to reduce recurrence of condition will improve Ability to verbalize feelings will improve Ability to disclose and discuss suicidal ideas Ability to demonstrate self-control will improve Ability to identify and develop effective coping behaviors will improve Ability to identify triggers associated with substance abuse/mental health issues will improve Ability to identify changes in lifestyle to reduce recurrence of condition will improve Ability to demonstrate self-control will improve Ability to identify triggers associated with substance abuse/mental health issues will improve  Medication Management: Evaluate patient's response, side effects, and tolerance of medication regimen.  Therapeutic Interventions: 1 to 1 sessions, Unit Group sessions and Medication administration.  Evaluation of Outcomes: Progressing  Physician Treatment Plan for Secondary Diagnosis: Principal Problem:   Schizoaffective disorder, bipolar type ([place]) Active Problems:   Hypothyroidism   Hypertension   Self-inflicted injury  Long  Term Goal(s): Improvement in symptoms so as ready for discharge Improvement in symptoms so as ready for discharge   Short Term Goals: Ability to identify changes in lifestyle to reduce recurrence of condition will improve Ability to verbalize feelings will improve Ability to disclose and discuss suicidal ideas Ability to demonstrate self-control will improve Ability to identify and develop effective coping behaviors will improve Ability to identify triggers associated with substance abuse/mental health issues will improve Ability to identify changes in lifestyle to reduce recurrence of condition will improve Ability to demonstrate self-control will improve Ability to identify triggers associated with substance abuse/mental health issues will improve     Medication Management: Evaluate patient's response, side effects, and tolerance of medication regimen.  Therapeutic Interventions: 1 to 1 sessions, Unit Group sessions and Medication administration.  Evaluation of Outcomes: Progressing  RN Treatment Plan for Primary Diagnosis: Schizoaffective disorder, bipolar type ([place]) Long Term Goal(s): Knowledge of disease and therapeutic regimen to maintain health will improve  Short Term Goals: Ability to demonstrate self-control, Ability to disclose and discuss suicidal ideas, Ability to identify and develop effective coping behaviors will improve and Compliance with prescribed medications will improve  Medication Management: RN will administer medications as ordered by provider, will assess and evaluate patient's response and provide education to patient for prescribed medication. RN will report any adverse and/or side effects to prescribing provider.  Therapeutic Interventions: 1 on 1 counseling sessions, Psychoeducation, Medication administration, Evaluate responses to treatment, Monitor vital signs and CBGs as ordered, Perform/monitor CIWA, COWS, AIMS and Fall Risk screenings as ordered, Perform  wound care treatments as ordered.  Evaluation of Outcomes: Progressing   LCSW Treatment Plan for Primary Diagnosis: Schizoaffective disorder, bipolar type ([place]) Long Term Goal(s): Safe transition to appropriate next level of care at discharge, Engage patient in therapeutic group addressing interpersonal concerns.  Short Term Goals: Engage patient in aftercare planning with referrals and resources, Increase social support, Increase emotional regulation, Identify triggers associated with mental health/substance abuse issues and Increase skills for wellness and recovery  Therapeutic Interventions: Assess for all discharge needs, 1 to 1 time with Social worker, Explore available resources and support systems, Assess for adequacy in community support network, Educate family and significant other(s) on suicide prevention, Complete Psychosocial Assessment, Interpersonal group therapy.  Evaluation of Outcomes: Progressing   Progress in Treatment: Attending groups: No. Participating in groups: No. Taking medication as prescribed: Yes. Toleration medication: Yes. Family/Significant other contact made: No, will contact:  Pts mother  Patient understands diagnosis: Yes. Discussing patient identified problems/goals with staff: Yes. Medical problems stabilized or resolved: Yes. Denies suicidal/homicidal ideation: Yes. Issues/concerns per patient self-inventory: No. Other:   New problem(s) identified: No, Describe:  None  New Short Term/Long Term Goal(s): "Start thinking before I act and control my anger."  Patient Goals:  "Start thinking before I act and control my anger."  Discharge Plan or Barriers: To return back to her group home and follow up with her ACTT team.  Reason for Continuation of Hospitalization: Depression Medication stabilization  Estimated Length of Stay: 3 days  Recreational Therapy: Patient Stressors: The group home staff  Patient Goal: Patient will successfully  identify 2 ways of making healthy decisions post d/c within 5 recreation therapy group sessions  Attendees: Patient: Melanie Bell 11/24/2017 11:14 AM  Physician: Dr. Bary Leriche, MD 11/24/2017 11:14 AM  Nursing: Eugenio Hoes, RN  11/24/2017 11:14 AM  RN Care Manager: 11/24/2017 11:14 AM  Social Worker: Darin Engels, [place] 11/24/2017 11:14 AM  Recreational Therapist: Isaias Sakai. Marcello Fennel, LRT 11/24/2017 11:14 AM  Other: Alden Hipp, LCSW 11/24/2017 11:14 AM  Other: Marney Doctor, [place] 11/24/2017 11:14 AM  Other: 11/24/2017 11:14 AM    Scribe for Treatment Team: Darin Engels, LCSW 11/24/2017 11:14 AM

## 2017-11-24 NOTE — Progress Notes (Signed)
Received Melanie Bell this AM after breakfast, she was compliant with her medication. She showered and her dressings was changed on  Her left upper and lower leg. Later she was medicated for a pain score of 8/10. She walks with a slight limp. She regrets her impulsive behavior. She is OOB in the milieu and attending the group therapy sessions.

## 2017-11-24 NOTE — BHH Group Notes (Signed)
11/24/2017 1PM  Type of Therapy and Topic:  Group Therapy:  Feelings around Relapse and Recovery  Participation Level:  Active   Description of Group:    Patients in this group will discuss emotions they experience before and after a relapse. They will process how experiencing these feelings, or avoidance of experiencing them, relates to having a relapse. Facilitator will guide patients to explore emotions they have related to recovery. Patients will be encouraged to process which emotions are more powerful. They will be guided to discuss the emotional reaction significant others in their lives may have to patients' relapse or recovery. Patients will be assisted in exploring ways to respond to the emotions of others without this contributing to a relapse.  Therapeutic Goals: 1. Patient will identify two or more emotions that lead to a relapse for them 2. Patient will identify two emotions that result when they relapse 3. Patient will identify two emotions related to recovery 4. Patient will demonstrate ability to communicate their needs through discussion and/or role plays   Summary of Patient Progress: Pt. Came outside to practice self-care as a concept related to recovery.     Therapeutic Modalities:   Cognitive Behavioral Therapy Solution-Focused Therapy Assertiveness Training Relapse Prevention Therapy   Darin Engels, [place] 11/24/2017 2:02 PM
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
## 2017-11-24 NOTE — Progress Notes (Signed)
Recreation Therapy Notes   Date: 11/24/2017  Time: 9:30 am  Location: Craft Room  Behavioral response: Appropriate   Intervention Topic: Team Work  Discussion/Intervention:  Group content on today was focused on teamwork. The group identified what teamwork is. Individuals described who is a part of their team. Patients expressed why they thought teamwork is important. The group stated reasons why they thought it was easier to work with a Dance movement psychotherapist team. Individuals discussed some positives and negatives of working with a team. Patients gave examples of past experiences they had while working with a team. The group participated in the intervention "Story in a bag", patients were in groups and were able to test their skill in a team setting.   Clinical Observations/Feedback:  Patient came to group and /identified her mom and her higher power as members of her team. Individual was social with peers and staff while participating in the intervention.  Arihana Ambrocio LRT/CTRS          Anacaren Kohan 11/24/2017 12:14 PM

## 2017-11-24 NOTE — Progress Notes (Signed)
   11/24/17 1030  Clinical Encounter Type  Visited With Patient  Visit Type Initial;Spiritual support;Behavioral Health  Referral From Nurse  Consult/Referral To Chaplain  Spiritual Encounters  Spiritual Needs Other (Comment)   [place] attended the PT's treatment team meeting. PT appeared to be very calm at the time of the encounter. PT stated reasonable goals. Goals of getting better and learning better coping skills.

## 2017-11-24 NOTE — Plan of Care (Signed)
Pt calm and cooperative this evening. Pt denies SI/HI. Pt is receptive to treatment and safety maintained on unit. Pt states her leg pain is bad, but that tylenol does not help the pain. Medication compliant. Will continue to monitor.  Problem: Education: Goal: Utilization of techniques to improve thought processes will improve Outcome: Progressing Goal: Knowledge of the prescribed therapeutic regimen will improve Outcome: Progressing   Problem: Coping: Goal: Coping ability will improve Outcome: Progressing Goal: Will verbalize feelings Outcome: Progressing   Problem: Health Behavior/Discharge Planning: Goal: Compliance with therapeutic regimen will improve Outcome: Progressing   Problem: Coping: Goal: Coping ability will improve Outcome: Progressing   Problem: Education: Goal: Knowledge of [facility] Education information/materials will improve Outcome: Progressing
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
## 2017-11-24 NOTE — Progress Notes (Signed)
MEDICATION RELATED CONSULT NOTE - INITIAL   Pharmacy Consult for clozapine Indication: ANC monitoring  Allergies  Allergen Reactions  . Betadine [Povidone Iodine] Other (See Comments)    Reaction:  Unknown   . Iodine Rash  . Shellfish-Derived Products Other (See Comments)    Reaction:  Unknown     Patient Measurements: Height: 5\' 6"  (167.6 cm) Weight: 214 lb (97.1 kg) IBW/kg (Calculated) : 59.3   Vital Signs: Temp: 98.4 F (36.9 C) (07/12 0611) Temp Source: Oral (07/12 0611) BP: 93/52 (07/12 7262) Pulse Rate: 113 (07/12 0614) Intake/Output from previous day: No intake/output data recorded. Intake/Output from this shift: No intake/output data recorded.  Labs: Recent Labs    11/23/17 0127  WBC 7.3  HGB 12.6  HCT 37.4  PLT 198  CREATININE 0.64  ALBUMIN 4.0  PROT 7.2  AST 16  ALT 7  ALKPHOS 76  BILITOT 0.5   Estimated Creatinine Clearance: 123 mL/min (by C-G formula based on SCr of 0.64 mg/dL).   Assessment: 27 yo female on clozapine 150 mg PO qhs currently  7/11 [place] 5500  Goal of Therapy:  ANC within goal range  Plan:  Lab reported to registry Pt eligible for dispensing Next labs in one week on 7/18  Pharmacy will continue to follow  Rayna Sexton L 11/24/2017,9:22 AM

## 2017-11-25 LAB — URINE CULTURE: Culture: >=100000 — AB

## 2017-11-25 NOTE — Plan of Care (Signed)
Pt. Verbalizes understanding of provided education. Pt. Reports improved coping. Pt. Compliant with medications. Pt. Denies SI/HI. Pt. Verbally contracts for safety. Pt. States she can remain safe while on the unit.    Problem: Education: Goal: Knowledge of the prescribed therapeutic regimen will improve Outcome: Progressing   Problem: Coping: Goal: Coping ability will improve Outcome: Progressing   Problem: Health Behavior/Discharge Planning: Goal: Compliance with therapeutic regimen will improve Outcome: Progressing   Problem: Safety: Goal: Ability to remain free from injury will improve Outcome: Progressing

## 2017-11-25 NOTE — Progress Notes (Signed)
[facility] MD Progress Note  11/26/2017 7:58 AM Melanie Bell  MRN:  [id number]  Subjective:    Melanie Bell denies any symptoms of depression, anxiety or psychosis. She is not suicidal or homicidal. Tolerates medications well. No somatic complaints. No unsafe behaviors. Discharge tomorrow.   Principal Problem: Schizoaffective disorder, bipolar type ([place]) Diagnosis:   Patient Active Problem List   Diagnosis Date Noted  . Schizoaffective disorder, bipolar type ([place]) [F25.0] 11/06/2014    Priority: High  . Self-inflicted injury [A63.01] 11/23/2017  . ASCUS of cervix with negative high risk HPV [R87.610] 08/28/2017  . Malingering [Z76.5] 05/06/2016  . Foreign body aspiration [T17.900A]   . Other foreign object in esophagus causing other injury, subsequent encounter [T18.198D]   . Swallowed foreign body [T18.9XXA] 04/23/2016  . Foreign body ingestion [T18.9XXA] 04/21/2016  . Gastric foreign body [T18.2XXA]   . 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 Tobacco use disorder [F17.200] 09/30/2014  . Hypothyroidism [E03.9] 09/29/2014  . Hypertension [I10] 09/29/2014   Total Time spent with patient: 20 minutes  Past Psychiatric History: scizoaffective disorder  Past Medical History:  Past Medical History:  Diagnosis Date  . Anxiety   . Asthma   . Depression   . GERD (gastroesophageal reflux disease)   . Hallucinations 09/30/2014   Sizoaffective  . Hyperlipidemia   . Hypertension   . Intentional ingestion of batteries 04/21/2016    2 AAA batteries and 1 thumb tack; intent to hurt herself/notes 04/21/2016  . Tardive dyskinesia 10/2014   recent onset    Past Surgical History:  Procedure Laterality Date  . ABDOMINAL SURGERY     "years ago" to remove foreign objects  . APPENDECTOMY    . BREAST EXCISIONAL BIOPSY Right 09/03/2007   surgical bx removed fibroadeoma  . BREAST LUMPECTOMY Right   . COLONOSCOPY WITH PROPOFOL N/A 09/10/2015   Procedure: COLONOSCOPY WITH PROPOFOL;  Surgeon:  [name], MD;  Location: [facility] ENDOSCOPY;  Service: Endoscopy;  Laterality: N/A;  . ESOPHAGOGASTRODUODENOSCOPY N/A 11/28/2014   Procedure: ESOPHAGOGASTRODUODENOSCOPY (EGD);  Surgeon: [name], MD;  Location: [facility] ENDOSCOPY;  Service: Endoscopy;  Laterality: N/A;  . ESOPHAGOGASTRODUODENOSCOPY N/A 02/21/2016   Procedure: ESOPHAGOGASTRODUODENOSCOPY (EGD);  Surgeon: Mauri Pole, MD;  Location[facility] ENDOSCOPY;  Service: Endoscopy;  Laterality: N/A;  . ESOPHAGOGASTRODUODENOSCOPY N/A 04/21/2016   Procedure: ESOPHAGOGASTRODUODENOSCOPY (EGD);  Surgeon: Gatha Mayer, MD;  Location: [facility] ENDOSCOPY;  Service: Endoscopy;  Laterality: N/A;  . ESOPHAGOGASTRODUODENOSCOPY N/A 05/06/2016   Procedure: ESOPHAGOGASTRODUODENOSCOPY (EGD);  Surgeon: Jonathon Bellows, MD;  Location: [facility] ENDOSCOPY;  Service: Gastroenterology;  Laterality: N/A;  . ESOPHAGOGASTRODUODENOSCOPY N/A 06/13/2016   Procedure: ESOPHAGOGASTRODUODENOSCOPY (EGD);  Surgeon: [name], MD;  Location: [facility] ENDOSCOPY;  Service: Endoscopy;  Laterality: N/A;  . ESOPHAGOGASTRODUODENOSCOPY (EGD) WITH PROPOFOL N/A 02/29/2016   Procedure: ESOPHAGOGASTRODUODENOSCOPY (EGD) WITH PROPOFOL;  Surgeon: [name], MD;  Location: [facility] ENDOSCOPY;  Service: Endoscopy;  Laterality: N/A;  . LAPAROTOMY N/A 09/12/2015   Procedure: EXPLORATORY LAPAROTOMY;  Surgeon: Florene Glen, MD;  Location: [facility] ORS;  Service: General;  Laterality: N/A;  . SIGMOIDOSCOPY N/A 09/12/2015   Procedure: Lonell Face;  Surgeon: Florene Glen, MD;  Location: [facility] ORS;  Service: General;  Laterality: N/A;  . WISDOM TOOTH EXTRACTION     Family History:  Family History  Problem Relation Age of Onset  . Depression Mother   . Hypertension Mother   . Sleep apnea Mother   . Asthma Mother   . COPD Mother   . Diabetes Mother   .  Breast cancer Maternal Aunt        20's   Family Psychiatric  History: depression Social History:  Social History   Substance and Sexual  Activity  Alcohol Use No     Social History   Substance and Sexual Activity  Drug Use No    Social History   Socioeconomic History  . Marital status: Single    Spouse name: Not on file  . Number of children: Not on file  . Years of education: Not on file  . Highest education level: Not on file  Occupational History  . Not on file  Social Needs  . Financial resource strain: Not on file  . Food insecurity:    Worry: Not on file    Inability: Not on file  . Transportation needs:    Medical: Not on file    Non-medical: Not on file  Tobacco Use  . Smoking status: Former Smoker    Packs/day: 0.50    Years: 3.00    Pack years: 1.50    Types: Cigarettes    Last attempt to quit: 08/2016    Years since quitting: 1.2  . Smokeless tobacco: Never Used  Substance and Sexual Activity  . Alcohol use: No  . Drug use: No  . Sexual activity: Never    Birth control/protection: Injection  Lifestyle  . Physical activity:    Days per week: Not on file    Minutes per session: Not on file  . Stress: Not on file  Relationships  . Social connections:    Talks on phone: Not on file    Gets together: Not on file    Attends religious service: Not on file    Active member of club or organization: Not on file    Attends meetings of clubs or organizations: Not on file    Relationship status: Not on file  Other Topics Concern  . Not on file  Social History Narrative  . Not on file   Additional Social History:                         Sleep: Fair  Appetite:  Fair  Current Medications: Current Facility-Administered Medications  Medication Dose Route Frequency Provider Last Rate Last Dose  . acetaminophen (TYLENOL) tablet 650 mg  650 mg Oral Q6H PRN [name], [name], MD   650 mg at 11/24/17 1710  . alum & mag hydroxide-simeth (MAALOX/MYLANTA) 200-200-20 MG/5ML suspension 30 mL  30 mL Oral Q4H PRN [name], [name], MD      . amLODipine (NORVASC) tablet 2.5 mg  2.5 mg Oral Daily  [name], [name], MD   2.5 mg at 11/25/17 0917  . cetirizine HCl (Zyrtec) 5 MG/5ML solution 10 mg  10 mg Oral BID PRN [name], [name], MD      . cloNIDine (CATAPRES) tablet 0.1 mg  0.1 mg Oral BID PRN Jenniger Figiel B, MD      . cloZAPine (CLOZARIL) tablet 150 mg  150 mg Oral QHS [name], [name], MD   150 mg at 11/25/17 2148  . hydrOXYzine (ATARAX/VISTARIL) tablet 50 mg  50 mg Oral Q6H PRN [name], [name], MD   50 mg at 11/23/17 2333  . levothyroxine (SYNTHROID, LEVOTHROID) tablet 100 mcg  100 mcg Oral QAC breakfast [name], [name], MD   100 mcg at 11/25/17 0917  . lithium carbonate (LITHOBID) CR tablet 300 mg  300 mg Oral BID WC [name], John  T, MD   300 mg at 11/25/17 1718  . magnesium hydroxide (MILK OF MAGNESIA) suspension 30 mL  30 mL Oral Daily PRN [name], [name], MD      . neomycin-bacitracin-polymyxin (NEOSPORIN) ointment   Topical Daily Rheannon Cerney B, MD      . OXcarbazepine (TRILEPTAL) tablet 150 mg  150 mg Oral BID [name], [name], MD   150 mg at 11/25/17 1718  . pantoprazole (PROTONIX) EC tablet 40 mg  40 mg Oral Daily [name], [name], MD   40 mg at 11/25/17 0917  . polyethylene glycol (MIRALAX / GLYCOLAX) packet 17 g  17 g Oral Daily PRN [name], [name], MD      . senna (SENOKOT) tablet 8.6 mg  1 tablet Oral Daily PRN [name], [name], MD      . traZODone (DESYREL) tablet 50 mg  50 mg Oral QHS [name], [name], MD   50 mg at 11/25/17 2148  . venlafaxine XR (EFFEXOR-XR) 24 hr capsule 150 mg  150 mg Oral Daily [name], [name], MD   150 mg at 11/25/17 0917  . [START ON 12/07/2017] Vitamin D (Ergocalciferol) (DRISDOL) capsule 50,000 Units  50,000 Units Oral Weekly [name], [name], MD        Lab Results: No results found for this or any previous visit (from the past 48 hour(s)).  Blood Alcohol level:  Lab Results  Component Value Date   ETH <10 11/23/2017   ETH <5 46/65/9935    Metabolic Disorder Labs: Lab Results  Component Value Date   HGBA1C 5.1 11/23/2017   MPG  99.67 11/23/2017   MPG 103 03/03/2016   Lab Results  Component Value Date   PROLACTIN 64.6 (H) 03/04/2016   PROLACTIN 15.8 06/16/2015   Lab Results  Component Value Date   CHOL 173 11/23/2017   TRIG 77 11/23/2017   HDL 41 11/23/2017   CHOLHDL 4.2 11/23/2017   VLDL 15 11/23/2017   LDLCALC 117 (H) 11/23/2017   LDLCALC 127 (H) 03/03/2016    Physical Findings: AIMS:  , ,  ,  ,    CIWA:    COWS:     Musculoskeletal: Strength & Muscle Tone: within normal limits Gait & Station: normal Patient leans: N/A  Psychiatric Specialty Exam: Physical Exam  Nursing note and vitals reviewed. Psychiatric: She has a normal mood and affect. Her speech is normal and behavior is normal. Thought content normal. Cognition and memory are normal. She expresses impulsivity.    Review of Systems  Neurological: Negative.   Psychiatric/Behavioral: Negative.   All other systems reviewed and are negative.   Blood pressure (!) 101/57, pulse (!) 101, temperature 98.4 F (36.9 C), temperature source Oral, resp. rate 18, height 5\' 6"  (1.676 m), weight 97.1 kg (214 lb), SpO2 100 %.Body mass index is 34.54 kg/m.  General Appearance: Casual  Eye Contact:  Good  Speech:  Clear and Coherent  Volume:  Normal  Mood:  Euthymic  Affect:  Appropriate  Thought Process:  Goal Directed and Descriptions of Associations: Intact  Orientation:  Full (Time, Place, and Person)  Thought Content:  WDL  Suicidal Thoughts:  No  Homicidal Thoughts:  No  Memory:  Immediate;   Fair Recent;   Fair Remote;   Fair  Judgement:  Poor  Insight:  Shallow  Psychomotor Activity:  Normal  Concentration:  Concentration: Fair and Attention Span: Fair  Recall:  [employer] of Knowledge:  Fair  Language:  Fair  Akathisia:  No  Handed:  Right  AIMS (if indicated):     Assets:  Communication Skills Desire for Improvement Financial Resources/Insurance Physical Health Resilience Social Support  ADL's:  Intact  Cognition:  WNL   Sleep:  Number of Hours: 7     Treatment Plan Summary: Daily contact with patient to assess and evaluate symptoms and progress in treatment and Medication management   Melanie Bell is a 27 year old female with a history of schizoaffective disorder and self injurious behavior admitted after suicidal gesture by cutting her arm with a steak knife in the context of losing her place at the group home.   #Suicidal ideation -patient is able to contract for safety in the hospital  #Mood/psychosis -continue Clozapine 150 mg nightly -continue Lithium 300 mg BID, Li0.54 -Trileptal 150 mg BID -Effexor 150 mg daily  #HTN, BP low -stopLisinopril 2.5 mg daily -Clonidine 0.1 mg BIR PRN  #Hypothyroidism -Synthroid 100 ug daily  #GERD -Protonix 40 mg daily  #Constipation -bowel regimen  #Skin cuts -Neosporin  #Social -incompetent adult -mother the guardian  #Labs -lipid panel, TSH and A1C -EKG -pregnancy test is negative  #Disposition -discharge to her group home -follow up with [facility] team  12/05/2017 No medications changes     Orson Slick, MD 11/26/2017, 7:58 AM

## 2017-11-25 NOTE — BHH Group Notes (Signed)
[facility] Group Notes:  (Nursing/MHT/Case Management/Adjunct)  Date:  11/25/2017  Time:  10:58 PM  Type of Therapy:  Group Therapy  Participation Level:  Active  Participation Quality:  Appropriate and Sharing  Affect:  Appropriate  Cognitive:  Appropriate  Insight:  Appropriate  Engagement in Group:  Engaged  Modes of Intervention:  Discussion  Summary of Progress/Problems: MHT addressed the messes that were left behind and encouraged patients to clean up after themselves. MHT reviewed visiting hours and the number of people allowed on the unit at one time. MHT informed patients of the phone hours. MHT informed patients of the time the day room would be shut down. MHT informed patients of wake-up call at 6am for vitals. MHT answered questions and concerns about the rules and expectations. MHT had patients introduce self and tell group what type of animal they would choose to be and why. MHT processed with group about decision making. MHT reviewed list of things to do when making decisions. MHT encouraged group to utilize the social workers while in group to address concerns and develop plans to address issue when released. MHT informed group they could be linked to providers once discharged. MHT informed group that IPRS funds were available for mental health services. MHT informed group they need to recognize a need exists to participate in treatment and medication management. Melanie Bell contributed to group by commenting and sharing thoughts on the decision making process. Melanie Bell was engaged throughout the group. [name] 11/25/2017, 10:58 PM

## 2017-11-25 NOTE — BHH Group Notes (Signed)
Date/Time:  11/25/2017 1PM   Type of Therapy and Topic:  Group Therapy:  Healthy and Unhealthy Supports   Participation Level:  Active    Description of Group:  Patients in this group were introduced to the idea of adding a variety of healthy supports to address the various needs in their lives.Patients discussed what additional healthy supports could be helpful in their recovery and wellness after discharge in order to prevent future hospitalizations.   An emphasis was placed on using counselor, doctor, therapy groups, 12-step groups, and problem-specific support groups to expand supports.  They also worked as a group on developing a specific plan for several patients to deal with unhealthy supports through [place], psychoeducation with loved ones, and even termination of relationships.    Therapeutic Goals:               1)  discuss importance of adding supports to stay well once out of the hospital             2)  compare healthy versus unhealthy supports and identify some examples of each             3)  generate ideas and descriptions of healthy supports that can be added             4)  offer mutual support about how to address unhealthy supports             5)  encourage active participation in and adherence to discharge plan                Summary of Patient Progress:  The patient stated that current healthy supports in her life are ACTT Team, mother and church while current unhealthy supports include her friend and staff at her group home.  The patient expressed a willingness to add speaking with a pastor and therapist  as support(s) to help in her recovery journey.     Therapeutic Modalities:   Motivational Interviewing Brief Solution-Focused Therapy   Darin Engels, [place] 11/25/2017 2:22 PM

## 2017-11-25 NOTE — Plan of Care (Signed)
Affect constricted.  Medication and group compliant.  Verbalizes feelings and needs without any difficulty. Visible in the milieu.  No inappropriate behavior noted.  Support offered.  Safety rounds maintained.   Problem: Education: Goal: Knowledge of the prescribed therapeutic regimen will improve Outcome: Progressing   Problem: Coping: Goal: Coping ability will improve Outcome: Progressing Goal: Will verbalize feelings Outcome: Progressing   Problem: Health Behavior/Discharge Planning: Goal: Compliance with therapeutic regimen will improve Outcome: Progressing   Problem: Coping: Goal: Coping ability will improve Outcome: Progressing

## 2017-11-26 NOTE — BHH Group Notes (Signed)
LCSW Group Therapy Note   11/26/2017 1:15pm   Type of Therapy and Topic:  Group Therapy:  Positive Affirmations   Participation Level:  Active  Description of Group: This group addressed positive affirmation toward self and others. Patients went around the room and identified two positive things about themselves and two positive things about a peer in the room. Patients reflected on how it felt to share something positive with others, to identify positive things about themselves, and to hear positive things from others. Patients were encouraged to have a daily reflection of positive characteristics or circumstances.  Therapeutic Goals 1. Patient will verbalize two of their positive qualities 2. Patient will demonstrate empathy for others by stating two positive qualities about a peer in the group 3. Patient will verbalize their feelings when voicing positive self affirmations and when voicing positive affirmations of others 4. Patients will discuss the potential positive impact on their wellness/recovery of focusing on positive traits of self and others. Summary of Patient Progress:  Stayed the entire time, engaged throughout.  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  "But I was able to quit with the help of an outpatient group, and now I am OK." Cited her faith and family supports as her main assets.  Therapeutic Modalities Cognitive Behavioral Therapy Motivational Interviewing  Trish Mage, [place] 11/26/2017 3:16 PM

## 2017-11-26 NOTE — Progress Notes (Signed)
D: Pt denies SI/HI/AVH. Pt is pleasant and cooperative. Pt. reports anxiety and depression this evening, but no numerical number given.  Patient Interaction appropriate and engages actively. Pt. Frequently active in the milieu and socializing with peers. Patient's lacerations from admissions checked and is clean dry intact.   A: Q x 15 minute observation checks were completed for safety. Patient was provided with education.  Patient was given medications. Patient  was encourage to attend groups, participate in unit activities and continue with plan of care. Pt. Chart and plans of care reviewed. Pt. Given support and encouragement.   R: Patient is complaint with medication and unit procedures. Pt. Attends groups and snacks.             Precautionary checks every 15 minutes for safety maintained, room free of safety hazards, patient sustains no injury or falls during this shift.

## 2017-11-26 NOTE — Discharge Summary (Addendum)
Physician Discharge Summary Note  Patient:  Melanie Bell is an 27 y.o., female MRN:  [id number] DOB:  31-Oct-1990 Patient phone:  [phone] (home)  Patient address:   [address] [place] [address],  Total Time spent with patient: 20 minutes plus 15 min on care coordination and documentation  Date of Admission:  11/23/2017 Date of Discharge: 12/11/2017  Reason for Admission:  Suicide attempt.  History of Present Illness:   Identifying data. [name] is a 27 year old female with a history of schizoaffective disorder.  Chief complaint. "It was stupid."  History of present illness. Information was obtained from the patient and the chart. The patient was brought to the ER from her group home after she stabbed herself in the leg with a steak knife. She could not control her behavior after she was told that she is being given a 30-day notice. She has been at this group home since March 2018 and has not been hospitalized even once. Good place. She denies any symptoms of depression, anxiety or psychosis. She uses no drugs. She deeply regrets her behavior.  Past psychiatric history. Long history of depression, psychosis, mood instability, and self injurious behaviors (swallowing, inserting foreign objects) with multiple hospitalizations and medication trials. She is in the care of [facility] team stable on Clozapine, Trileptal, Lithium and Effexor.  Family psychiatric history. Mother with depression.  Social history. She is an incompetent adult. Her mother, who lives out of state, in [place],  is the guardian. The patient is disabled from mental illness. She has been residing at the group home but just received a 30-day notice.   Principal Problem: Schizoaffective disorder, bipolar type [facility]) Discharge Diagnoses: Patient Active Problem List   Diagnosis Date Noted  . Schizoaffective disorder, bipolar type ([place]) [F25.0] 11/06/2014    Priority: High   . Self-inflicted injury [D32.20] 11/23/2017  . ASCUS of cervix with negative high risk HPV [R87.610] 08/28/2017  . Malingering [Z76.5] 05/06/2016  . Foreign body aspiration [T17.900A]   . Other foreign object in esophagus causing other injury, subsequent encounter [T18.198D]   . Swallowed foreign body [T18.9XXA] 04/23/2016  . Foreign body ingestion [T18.9XXA] 04/21/2016  . Gastric foreign body [T18.2XXA]   . 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 Tobacco use disorder [F17.200] 09/30/2014  . Hypothyroidism [E03.9] 09/29/2014  . Hypertension [I10] 09/29/2014     Past Medical History:  Past Medical History:  Diagnosis Date  . Anxiety   . Asthma   . Depression   . GERD (gastroesophageal reflux disease)   . Hallucinations 09/30/2014   Sizoaffective  . Hyperlipidemia   . Hypertension   . Intentional ingestion of batteries 04/21/2016    2 AAA batteries and 1 thumb tack; intent to hurt herself/notes 04/21/2016  . Tardive dyskinesia 10/2014   recent onset    Past Surgical History:  Procedure Laterality Date  . ABDOMINAL SURGERY     "years ago" to remove foreign objects  . APPENDECTOMY    . BREAST EXCISIONAL BIOPSY Right 09/03/2007   surgical bx removed fibroadeoma  . BREAST LUMPECTOMY Right   . COLONOSCOPY WITH PROPOFOL N/A 09/10/2015   Procedure: COLONOSCOPY WITH PROPOFOL;  Surgeon: [name], MD;  Location[facility] ENDOSCOPY;  Service: Endoscopy;  Laterality: N/A;  . ESOPHAGOGASTRODUODENOSCOPY N/A 11/28/2014   Procedure: ESOPHAGOGASTRODUODENOSCOPY (EGD);  Surgeon: [name], MD;  Location: [facility] ENDOSCOPY;  Service: Endoscopy;  Laterality: N/A;  . ESOPHAGOGASTRODUODENOSCOPY N/A 02/21/2016   Procedure: ESOPHAGOGASTRODUODENOSCOPY (EGD);  Surgeon:  Mauri Pole, MD;  Location: [place] ENDOSCOPY;  Service: Endoscopy;  Laterality: N/A;  . ESOPHAGOGASTRODUODENOSCOPY N/A 04/21/2016   Procedure: ESOPHAGOGASTRODUODENOSCOPY (EGD);  Surgeon: Gatha Mayer, MD;  Location: [facility] ENDOSCOPY;   Service: Endoscopy;  Laterality: N/A;  . ESOPHAGOGASTRODUODENOSCOPY N/A 05/06/2016   Procedure: ESOPHAGOGASTRODUODENOSCOPY (EGD);  Surgeon: Jonathon Bellows, MD;  Location: [facility] ENDOSCOPY;  Service: Gastroenterology;  Laterality: N/A;  . ESOPHAGOGASTRODUODENOSCOPY N/A 06/13/2016   Procedure: ESOPHAGOGASTRODUODENOSCOPY (EGD);  Surgeon: [name], MD;  Location: [facility] ENDOSCOPY;  Service: Endoscopy;  Laterality: N/A;  . ESOPHAGOGASTRODUODENOSCOPY (EGD) WITH PROPOFOL N/A 02/29/2016   Procedure: ESOPHAGOGASTRODUODENOSCOPY (EGD) WITH PROPOFOL;  Surgeon: [name], MD;  Location: [facility] ENDOSCOPY;  Service: Endoscopy;  Laterality: N/A;  . LAPAROTOMY N/A 09/12/2015   Procedure: EXPLORATORY LAPAROTOMY;  Surgeon: Florene Glen, MD;  Location: [facility] ORS;  Service: General;  Laterality: N/A;  . SIGMOIDOSCOPY N/A 09/12/2015   Procedure: Lonell Face;  Surgeon: Florene Glen, MD;  Location: [facility] ORS;  Service: General;  Laterality: N/A;  . WISDOM TOOTH EXTRACTION     Family History:  Family History  Problem Relation Age of Onset  . Depression Mother   . Hypertension Mother   . Sleep apnea Mother   . Asthma Mother   . COPD Mother   . Diabetes Mother   . Breast cancer Maternal Aunt        20's    Social History:  Social History   Substance and Sexual Activity  Alcohol Use No     Social History   Substance and Sexual Activity  Drug Use No    Social History   Socioeconomic History  . Marital status: Single    Spouse name: Not on file  . Number of children: Not on file  . Years of education: Not on file  . Highest education level: Not on file  Occupational History  . Not on file  Social Needs  . Financial resource strain: Not on file  . Food insecurity:    Worry: Not on file    Inability: Not on file  . Transportation needs:    Medical: Not on file    Non-medical: Not on file  Tobacco Use  . Smoking status: Former Smoker    Packs/day: 0.50    Years: 3.00    Pack years: 1.50     Types: Cigarettes    Last attempt to quit: 08/2016    Years since quitting: 1.3  . Smokeless tobacco: Never Used  Substance and Sexual Activity  . Alcohol use: No  . Drug use: No  . Sexual activity: Never    Birth control/protection: Injection  Lifestyle  . Physical activity:    Days per week: Not on file    Minutes per session: Not on file  . Stress: Not on file  Relationships  . Social connections:    Talks on phone: Not on file    Gets together: Not on file    Attends religious service: Not on file    Active member of club or organization: Not on file    Attends meetings of clubs or organizations: Not on file    Relationship status: Not on file  Other Topics Concern  . Not on file  Social History Narrative  . Not on file    Hospital Course:    [name] is a 27 year old female with a history of schizoaffective disorder and self injurious behavior admitted after suicidal gesture by cutting her arm with a steak knife in the  context of losing her place at the group home. No medication changes were offered. At the time of discharge, the patient is no longer suicidal. She is able to contract for safety. She is forward thinking and optimistic about the future.  #Mood/psychosis -continue Clozapine 150 mg nightly, Clo+NClo level 314 -continue Lithium 300 mg BID, Li0.54 -Trileptal 150 mg BID -Effexor 150 mg daily  #HTN -continue Lisinopril 2.5 mg daily  #Hypothyroidism -Synthroid 100 ug daily  #GERD -Protonix 40 mg daily  #Constipation -bowel regimen  #UTI -completed course of antibiotics  #Social -incompetent adult -mother the guardian  #Labs -lipid panel, TSH and A1C are normal -EKG reviewed, NSR with QTc 437 -pregnancy test is negative  #Disposition -discharge to a new group home -follow up with[facility] team    Physical Findings: AIMS: Facial and Oral Movements Muscles of Facial Expression: None, normal Lips and Perioral Area: None,  normal Jaw: None, normal Tongue: None, normal,Extremity Movements Upper (arms, wrists, hands, fingers): None, normal Lower (legs, knees, ankles, toes): None, normal, Trunk Movements Neck, shoulders, hips: None, normal, Overall Severity Severity of abnormal movements (highest score from questions above): None, normal Incapacitation due to abnormal movements: None, normal Patient's awareness of abnormal movements (rate only patient's report): No Awareness, Dental Status Current problems with teeth and/or dentures?: No Does patient usually wear dentures?: No  CIWA:    COWS:     Musculoskeletal: Strength & Muscle Tone: within normal limits Gait & Station: normal Patient leans: N/A  Psychiatric Specialty Exam: Physical Exam  Nursing note and vitals reviewed. Psychiatric: She has a normal mood and affect. Her speech is normal and behavior is normal. Thought content normal. Cognition and memory are normal. She expresses impulsivity.    Review of Systems  Neurological: Negative.   Psychiatric/Behavioral: Negative.   All other systems reviewed and are negative.   Blood pressure 113/74, pulse 61, temperature 98.1 F (36.7 C), resp. rate 18, height 5\' 6"  (1.676 m), weight 97.1 kg (214 lb), SpO2 99 %.Body mass index is 34.54 kg/m.  General Appearance: Casual  Eye Contact:  Good  Speech:  Clear and Coherent  Volume:  Normal  Mood:  Euthymic  Affect:  Appropriate  Thought Process:  Goal Directed and Descriptions of Associations: Intact  Orientation:  Full (Time, Place, and Person)  Thought Content:  WDL  Suicidal Thoughts:  No  Homicidal Thoughts:  No  Memory:  Immediate;   Fair Recent;   Fair Remote;   Fair  Judgement:  Poor  Insight:  Lacking  Psychomotor Activity:  Normal  Concentration:  Concentration: Fair and Attention Span: Fair  Recall:  [employer] of Knowledge:  Fair  Language:  Fair  Akathisia:  No  Handed:  Right  AIMS (if indicated):     Assets:  Communication  Skills Desire for Improvement Financial Resources/Insurance Housing Physical Health Resilience Social Support  ADL's:  Intact  Cognition:  WNL  Sleep:  Number of Hours: 7     Have you used any form of tobacco in the last 30 days? (Cigarettes, Smokeless Tobacco, Cigars, and/or Pipes): No  Has this patient used any form of tobacco in the last 30 days? (Cigarettes, Smokeless Tobacco, Cigars, and/or Pipes) Yes, No  Blood Alcohol level:  Lab Results  Component Value Date   ETH <10 11/23/2017   ETH <5 07/37/1062    Metabolic Disorder Labs:  Lab Results  Component Value Date   HGBA1C 5.1 11/23/2017   MPG 99.67 11/23/2017   MPG  103 03/03/2016   Lab Results  Component Value Date   PROLACTIN 64.6 (H) 03/04/2016   PROLACTIN 15.8 06/16/2015   Lab Results  Component Value Date   CHOL 173 11/23/2017   TRIG 77 11/23/2017   HDL 41 11/23/2017   CHOLHDL 4.2 11/23/2017   VLDL 15 11/23/2017   LDLCALC 117 (H) 11/23/2017   LDLCALC 127 (H) 03/03/2016    See Psychiatric Specialty Exam and Suicide Risk Assessment completed by Attending Physician prior to discharge.  Discharge destination:  Home  Is patient on multiple antipsychotic therapies at discharge:  No   Has Patient had three or more failed trials of antipsychotic monotherapy by history:  No  Recommended Plan for Multiple Antipsychotic Therapies: NA  Discharge Instructions    Diet - low sodium heart healthy   Complete by:  As directed    Diet - low sodium heart healthy   Complete by:  As directed    Increase activity slowly   Complete by:  As directed    Increase activity slowly   Complete by:  As directed      Allergies as of 12/11/2017      Reactions   Betadine [povidone Iodine] Other (See Comments)   Reaction:  Unknown    Iodine Rash   Shellfish-derived Products Other (See Comments)   Reaction:  Unknown       Medication List    TAKE these medications     Indication  amLODipine 2.5 MG tablet Commonly  known as:  NORVASC Take 1 tablet (2.5 mg total) by mouth daily.  Indication:  High Blood Pressure Disorder   cetirizine 10 MG tablet Commonly known as:  ZYRTEC Take 1 tablet (10 mg total) by mouth daily as needed for allergies.  Indication:  Hayfever   clozapine 50 MG tablet Commonly known as:  CLOZARIL Take 3 tablets (150 mg total) by mouth at bedtime. What changed:  when to take this  Indication:  Schizophrenia that does Not Respond to Usual Drug Therapy   hydrOXYzine 50 MG tablet Commonly known as:  ATARAX/VISTARIL Take 1 tablet (50 mg total) by mouth 2 (two) times daily as needed.  Indication:  Feeling Anxious   levothyroxine 100 MCG tablet Commonly known as:  SYNTHROID, LEVOTHROID Take 1 tablet (100 mcg total) by mouth daily before breakfast.  Indication:  Underactive Thyroid   lithium carbonate 300 MG capsule Take 1 capsule (300 mg total) by mouth 2 (two) times daily with a meal.  Indication:  Schizoaffective Disorder   medroxyPROGESTERone 150 MG/ML injection Commonly known as:  DEPO-PROVERA Inject 1 mL (150 mg total) into the muscle every 3 (three) months.  Indication:  Birth Control Treatment   omeprazole 20 MG capsule Commonly known as:  PRILOSEC Take 1 capsule (20 mg total) by mouth daily.  Indication:  Gastroesophageal Reflux Disease   Oxcarbazepine 300 MG tablet Commonly known as:  TRILEPTAL Take 1 tablet (300 mg total) by mouth 2 (two) times daily. What changed:    medication strength  how much to take  Indication:  Manic-Depression   polyethylene glycol packet Commonly known as:  MIRALAX / GLYCOLAX Take 17 g by mouth daily as needed.  Indication:  Constipation   senna 8.6 MG Tabs tablet Commonly known as:  SENOKOT Take 1 tablet (8.6 mg total) by mouth daily as needed for mild constipation.  Indication:  Constipation   traZODone 50 MG tablet Commonly known as:  DESYREL Take 1 tablet (50 mg total) by mouth at bedtime.  Indication:  Trouble  Sleeping   venlafaxine XR 150 MG 24 hr capsule Commonly known as:  EFFEXOR-XR Take 1 capsule (150 mg total) by mouth daily with breakfast.  Indication:  Major Depressive Disorder   Vitamin D (Ergocalciferol) 50000 units Caps capsule Commonly known as:  DRISDOL Take 1 capsule (50,000 Units total) by mouth every 7 (seven) days.  Indication:  Osteoporosis      Follow-up Information    Services, Psychotherapeutic Follow up.   Why:  Your ACTT will be coming out to see you on 12/11/17. Contact information: [address] [place] [address] [phone]           Follow-up recommendations:  Activity:  as tolerated Diet:  low sodium heart healthy Other:  keep follo up appointments  Comments:    Signed: Orson Slick, MD 12/11/2017, 9:52 AM

## 2017-11-26 NOTE — [facility] Suicide Risk Assessment (Addendum)
[facility] Discharge Suicide Risk Assessment   Principal Problem: Schizoaffective disorder, bipolar type [facility]) Discharge Diagnoses:  Patient Active Problem List   Diagnosis Date Noted  . Schizoaffective disorder, bipolar type ([place]) [F25.0] 11/06/2014    Priority: High  . Self-inflicted injury [P79.48] 11/23/2017  . ASCUS of cervix with negative high risk HPV [R87.610] 08/28/2017  . Malingering [Z76.5] 05/06/2016  . Foreign body aspiration [T17.900A]   . Other foreign object in esophagus causing other injury, subsequent encounter [T18.198D]   . Swallowed foreign body [T18.9XXA] 04/23/2016  . Foreign body ingestion [T18.9XXA] 04/21/2016  . Gastric foreign body [T18.2XXA]   . 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 Tobacco use disorder [F17.200] 09/30/2014  . Hypothyroidism [E03.9] 09/29/2014  . Hypertension [I10] 09/29/2014    Total Time spent with patient: 20 minutes  Musculoskeletal: Strength & Muscle Tone: within normal limits Gait & Station: normal Patient leans: N/A  Psychiatric Specialty Exam: Review of Systems  Neurological: Negative.   Psychiatric/Behavioral: Negative.   All other systems reviewed and are negative.   Blood pressure 100/60, pulse 76, temperature 98.7 F (37.1 C), temperature source Oral, resp. rate 18, height 5\' 6"  (1.676 m), weight 97.1 kg (214 lb), SpO2 98 %.Body mass index is 34.54 kg/m.  General Appearance: Casual  Eye Contact::  Good  Speech:  Clear and Coherent409  Volume:  Normal  Mood:  Euthymic  Affect:  Appropriate  Thought Process:  Goal Directed and Descriptions of Associations: Intact  Orientation:  Full (Time, Place, and Person)  Thought Content:  WDL  Suicidal Thoughts:  No  Homicidal Thoughts:  No  Memory:  Immediate;   Fair Recent;   Fair Remote;   Fair  Judgement:  Poor  Insight:  Lacking  Psychomotor Activity:  Normal  Concentration:  Fair  Recall:  [employer] of Knowledge:Fair  Language: Fair  Akathisia:  No  Handed:  Right   AIMS (if indicated):     Assets:  Communication Skills Desire for Improvement Financial Resources/Insurance Housing Physical Health Resilience Social Support  Sleep:  Number of Hours: 7.5  Cognition: WNL  ADL's:  Intact   Mental Status Per Nursing Assessment::   On Admission:  Suicidal ideation indicated by patient, Suicide plan, Self-harm thoughts, Self-harm behaviors  Demographic Factors:  Adolescent or young adult and Caucasian  Loss Factors: Loss of significant relationship  Historical Factors: Prior suicide attempts, Family history of mental illness or substance abuse and Impulsivity  Risk Reduction Factors:   Sense of responsibility to family, Living with another person, especially a relative, Positive social support and Positive therapeutic relationship  Continued Clinical Symptoms:  Schizophrenia:   Depressive state Less than 53 years old  Cognitive Features That Contribute To Risk:  None    Suicide Risk:  Minimal: No identifiable suicidal ideation.  Patients presenting with no risk factors but with morbid ruminations; may be classified as minimal risk based on the severity of the depressive symptoms  Follow-up Information    Services, Psychotherapeutic Follow up.   Contact information: [address] [place] [address] [phone]           Plan Of Care/Follow-up recommendations:  Activity:  as tolerated Diet:  low soium hear healthy  Other:  keep follow up appointments  Orson Slick, MD 11/29/2017, 9:53 AM

## 2017-11-26 NOTE — Progress Notes (Signed)
[facility] Group Notes:  (Nursing/MHT/Case Management/Adjunct)  Date:  11/26/2017  Time:  9:47 PM  Type of Therapy:  wrap up group  Participation Level:  Active  Participation Quality:  Appropriate and Sharing  Affect:  Appropriate  Cognitive:  Alert and Appropriate  Insight:  Appropriate and Good  Engagement in Group:  Engaged  Modes of Intervention:  Discussion  Summary of Progress/Problems: Melanie Bell shared with the group this evening how her day started out as a 4 but completed with an 8.  She shared that she was upset at first due to her receiving a 30 day notice to leave the group home but she is ok with it.  She continued to share how her ACTT team is working to locate another placement that will address her individual needs.    [name] 11/26/2017, 9:47 PM

## 2017-11-26 NOTE — Progress Notes (Signed)
Received Melanie Bell this AM after breakfast, she was compliant with her medications. She rated depression 2/10 and anxiety 3/10 on her self inventory sheet.  She has been visible in the milieu at intervals with her peers. Her dressing on her leg was removed and ung applied. She has been in good spirits throughout the day.

## 2017-11-27 LAB — CBC WITH DIFFERENTIAL/PLATELET
BASOS PCT: 0 %
Basophils Absolute: 0 10*3/uL (ref 0–0.1)
EOS ABS: 0.1 10*3/uL (ref 0–0.7)
EOS PCT: 2 %
HCT: 38.3 % (ref 35.0–47.0)
HEMOGLOBIN: 13 g/dL (ref 12.0–16.0)
Lymphocytes Relative: 24 %
Lymphs Abs: 1.5 10*3/uL (ref 1.0–3.6)
MCH: 30.6 pg (ref 26.0–34.0)
MCHC: 33.8 g/dL (ref 32.0–36.0)
MCV: 90.6 fL (ref 80.0–100.0)
MONO ABS: 0.4 10*3/uL (ref 0.2–0.9)
Monocytes Relative: 6 %
NEUTROS PCT: 68 %
Neutro Abs: 4.1 10*3/uL (ref 1.4–6.5)
PLATELETS: 203 10*3/uL (ref 150–440)
RBC: 4.23 MIL/uL (ref 3.80–5.20)
RDW: 13.3 % (ref 11.5–14.5)
WBC: 6.2 10*3/uL (ref 3.6–11.0)

## 2017-11-27 NOTE — Progress Notes (Signed)
D: Pt denies SI/HI/AVH. Pt is pleasant and cooperative. Pt. has no Complaints.  Patient Interaction appropriate and engaging. Pt. Frequently observed socializing with peers and active in the milieu. No behaviors to report. Pt. Wounds checked and cared for today by TW.   A: Q x 15 minute observation checks were completed for safety. Patient was provided with education.  Patient was given scheduled medications. Patient  was encourage to attend groups, participate in unit activities and continue with plan of care. Pt. Chart and plans of care reviewed. Pt. Given support and encouragement.   R: Patient is complaint with medication and unit procedures.             Precautionary checks every 15 minutes for safety maintained, room free of safety hazards, patient sustains no injury or falls during this shift.

## 2017-11-27 NOTE — Progress Notes (Signed)
[facility] MD Progress Note  11/27/2017 7:43 PM Melanie Bell  MRN:  [id number]  Subjective:  [name] is ready for discharge. In spite of multiple attempts, we were unable to reach her mother who is her guardian and lives in [place]. Her group home however never responded to our messages and is unlikely to pick up the patient toninght.  Principal Problem: Schizoaffective disorder, bipolar type ([place]) Diagnosis:   Patient Active Problem List   Diagnosis Date Noted  . Schizoaffective disorder, bipolar type ([place]) [F25.0] 11/06/2014    Priority: High  . Self-inflicted injury [T24.58] 11/23/2017  . ASCUS of cervix with negative high risk HPV [R87.610] 08/28/2017  . Malingering [Z76.5] 05/06/2016  . Foreign body aspiration [T17.900A]   . Other foreign object in esophagus causing other injury, subsequent encounter [T18.198D]   . Swallowed foreign body [T18.9XXA] 04/23/2016  . Foreign body ingestion [T18.9XXA] 04/21/2016  . Gastric foreign body [T18.2XXA]   . 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 Tobacco use disorder [F17.200] 09/30/2014  . Hypothyroidism [E03.9] 09/29/2014  . Hypertension [I10] 09/29/2014   Total Time spent with patient: 20 minutes  Past Psychiatric History: schizoaffective disorder  Past Medical History:  Past Medical History:  Diagnosis Date  . Anxiety   . Asthma   . Depression   . GERD (gastroesophageal reflux disease)   . Hallucinations 09/30/2014   Sizoaffective  . Hyperlipidemia   . Hypertension   . Intentional ingestion of batteries 04/21/2016    2 AAA batteries and 1 thumb tack; intent to hurt herself/notes 04/21/2016  . Tardive dyskinesia 10/2014   recent onset    Past Surgical History:  Procedure Laterality Date  . ABDOMINAL SURGERY     "years ago" to remove foreign objects  . APPENDECTOMY    . BREAST EXCISIONAL BIOPSY Right 09/03/2007   surgical bx removed fibroadeoma  . BREAST LUMPECTOMY Right   . COLONOSCOPY WITH PROPOFOL N/A 09/10/2015    Procedure: COLONOSCOPY WITH PROPOFOL;  Surgeon: [name], MD;  Location[facility] ENDOSCOPY;  Service: Endoscopy;  Laterality: N/A;  . ESOPHAGOGASTRODUODENOSCOPY N/A 11/28/2014   Procedure: ESOPHAGOGASTRODUODENOSCOPY (EGD);  Surgeon: [name], MD;  Location: [facility] ENDOSCOPY;  Service: Endoscopy;  Laterality: N/A;  . ESOPHAGOGASTRODUODENOSCOPY N/A 02/21/2016   Procedure: ESOPHAGOGASTRODUODENOSCOPY (EGD);  Surgeon: Mauri Pole, MD;  Location: [facility] ENDOSCOPY;  Service: Endoscopy;  Laterality: N/A;  . ESOPHAGOGASTRODUODENOSCOPY N/A 04/21/2016   Procedure: ESOPHAGOGASTRODUODENOSCOPY (EGD);  Surgeon: Gatha Mayer, MD;  Location: [facility] ENDOSCOPY;  Service: Endoscopy;  Laterality: N/A;  . ESOPHAGOGASTRODUODENOSCOPY N/A 05/06/2016   Procedure: ESOPHAGOGASTRODUODENOSCOPY (EGD);  Surgeon: Jonathon Bellows, MD;  Location: [facility] ENDOSCOPY;  Service: Gastroenterology;  Laterality: N/A;  . ESOPHAGOGASTRODUODENOSCOPY N/A 06/13/2016   Procedure: ESOPHAGOGASTRODUODENOSCOPY (EGD);  Surgeon: [name], MD;  Location: [facility] ENDOSCOPY;  Service: Endoscopy;  Laterality: N/A;  . ESOPHAGOGASTRODUODENOSCOPY (EGD) WITH PROPOFOL N/A 02/29/2016   Procedure: ESOPHAGOGASTRODUODENOSCOPY (EGD) WITH PROPOFOL;  Surgeon: [name], MD;  Location: [facility] ENDOSCOPY;  Service: Endoscopy;  Laterality: N/A;  . LAPAROTOMY N/A 09/12/2015   Procedure: EXPLORATORY LAPAROTOMY;  Surgeon: Florene Glen, MD;  Location: [facility] ORS;  Service: General;  Laterality: N/A;  . SIGMOIDOSCOPY N/A 09/12/2015   Procedure: Lonell Face;  Surgeon: Florene Glen, MD;  Location: [facility] ORS;  Service: General;  Laterality: N/A;  . WISDOM TOOTH EXTRACTION     Family History:  Family History  Problem Relation Age of Onset  . Depression Mother   . Hypertension Mother   . Sleep apnea Mother   .  Asthma Mother   . COPD Mother   . Diabetes Mother   . Breast cancer Maternal Aunt        20's   Family Psychiatric  History: depression Social History:   Social History   Substance and Sexual Activity  Alcohol Use No     Social History   Substance and Sexual Activity  Drug Use No    Social History   Socioeconomic History  . Marital status: Single    Spouse name: Not on file  . Number of children: Not on file  . Years of education: Not on file  . Highest education level: Not on file  Occupational History  . Not on file  Social Needs  . Financial resource strain: Not on file  . Food insecurity:    Worry: Not on file    Inability: Not on file  . Transportation needs:    Medical: Not on file    Non-medical: Not on file  Tobacco Use  . Smoking status: Former Smoker    Packs/day: 0.50    Years: 3.00    Pack years: 1.50    Types: Cigarettes    Last attempt to quit: 08/2016    Years since quitting: 1.2  . Smokeless tobacco: Never Used  Substance and Sexual Activity  . Alcohol use: No  . Drug use: No  . Sexual activity: Never    Birth control/protection: Injection  Lifestyle  . Physical activity:    Days per week: Not on file    Minutes per session: Not on file  . Stress: Not on file  Relationships  . Social connections:    Talks on phone: Not on file    Gets together: Not on file    Attends religious service: Not on file    Active member of club or organization: Not on file    Attends meetings of clubs or organizations: Not on file    Relationship status: Not on file  Other Topics Concern  . Not on file  Social History Narrative  . Not on file   Additional Social History:                         Sleep: Fair  Appetite:  Fair  Current Medications: Current Facility-Administered Medications  Medication Dose Route Frequency Provider Last Rate Last Dose  . acetaminophen (TYLENOL) tablet 650 mg  650 mg Oral Q6H PRN [name], [name], MD   650 mg at 11/24/17 1710  . alum & mag hydroxide-simeth (MAALOX/MYLANTA) 200-200-20 MG/5ML suspension 30 mL  30 mL Oral Q4H PRN [name], [name], MD      . amLODipine  (NORVASC) tablet 2.5 mg  2.5 mg Oral Daily [name], [name], MD   2.5 mg at 11/27/17 0814  . cetirizine HCl (Zyrtec) 5 MG/5ML solution 10 mg  10 mg Oral BID PRN [name], [name], MD      . cloNIDine (CATAPRES) tablet 0.1 mg  0.1 mg Oral BID PRN Athan Casalino B, MD      . cloZAPine (CLOZARIL) tablet 150 mg  150 mg Oral QHS [name], [name], MD   150 mg at 11/26/17 2140  . hydrOXYzine (ATARAX/VISTARIL) tablet 50 mg  50 mg Oral Q6H PRN [name], [name], MD   50 mg at 11/23/17 2333  . levothyroxine (SYNTHROID, LEVOTHROID) tablet 100 mcg  100 mcg Oral QAC breakfast [name], [name], MD   100 mcg at 11/27/17 0815  .  lithium carbonate (LITHOBID) CR tablet 300 mg  300 mg Oral BID WC [name], [name], MD   300 mg at 11/27/17 1729  . magnesium hydroxide (MILK OF MAGNESIA) suspension 30 mL  30 mL Oral Daily PRN [name], [name], MD      . neomycin-bacitracin-polymyxin (NEOSPORIN) ointment   Topical Daily Eluzer Howdeshell B, MD      . OXcarbazepine (TRILEPTAL) tablet 150 mg  150 mg Oral BID [name], [name], MD   150 mg at 11/27/17 1729  . pantoprazole (PROTONIX) EC tablet 40 mg  40 mg Oral Daily [name], [name], MD   40 mg at 11/27/17 0814  . polyethylene glycol (MIRALAX / GLYCOLAX) packet 17 g  17 g Oral Daily PRN [name], [name], MD      . senna (SENOKOT) tablet 8.6 mg  1 tablet Oral Daily PRN [name], [name], MD      . traZODone (DESYREL) tablet 50 mg  50 mg Oral QHS [name], [name], MD   50 mg at 11/26/17 2140  . venlafaxine XR (EFFEXOR-XR) 24 hr capsule 150 mg  150 mg Oral Daily [name], [name], MD   150 mg at 11/27/17 0814  . [START ON 12/07/2017] Vitamin D (Ergocalciferol) (DRISDOL) capsule 50,000 Units  50,000 Units Oral Weekly [name], [name], MD        Lab Results:  Results for orders placed or performed during the hospital encounter of 11/23/17 (from the past 48 hour(s))  CBC with Differential/Platelet     Status: None   Collection Time: 11/27/17 10:43 AM  Result Value Ref Range   WBC 6.2 3.6  - 11.0 K/uL   RBC 4.23 3.80 - 5.20 MIL/uL   Hemoglobin 13.0 12.0 - 16.0 g/dL   HCT 38.3 35.0 - 47.0 %   MCV 90.6 80.0 - 100.0 fL   MCH 30.6 26.0 - 34.0 pg   MCHC 33.8 32.0 - 36.0 g/dL   RDW 13.3 11.5 - 14.5 %   Platelets 203 150 - 440 K/uL   Neutrophils Relative % 68 %   Neutro Abs 4.1 1.4 - 6.5 K/uL   Lymphocytes Relative 24 %   Lymphs Abs 1.5 1.0 - 3.6 K/uL   Monocytes Relative 6 %   Monocytes Absolute 0.4 0.2 - 0.9 K/uL   Eosinophils Relative 2 %   Eosinophils Absolute 0.1 0 - 0.7 K/uL   Basophils Relative 0 %   Basophils Absolute 0.0 0 - 0.1 K/uL    Comment: Performed at [facility], [address] [place], [place] [address]    Blood Alcohol level:  Lab Results  Component Value Date   [facility] <10 11/23/2017   ETH <5 47/82/9562    Metabolic Disorder Labs: Lab Results  Component Value Date   HGBA1C 5.1 11/23/2017   MPG 99.67 11/23/2017   MPG 103 03/03/2016   Lab Results  Component Value Date   PROLACTIN 64.6 (H) 03/04/2016   PROLACTIN 15.8 06/16/2015   Lab Results  Component Value Date   CHOL 173 11/23/2017   TRIG 77 11/23/2017   HDL 41 11/23/2017   CHOLHDL 4.2 11/23/2017   VLDL 15 11/23/2017   LDLCALC 117 (H) 11/23/2017   LDLCALC 127 (H) 03/03/2016    Physical Findings: AIMS:  , ,  ,  ,    CIWA:    COWS:     Musculoskeletal: Strength & Muscle Tone: within normal limits Gait & Station: normal Patient leans: N/A  Psychiatric Specialty Exam: Physical  Exam  Nursing note and vitals reviewed. Psychiatric: She has a normal mood and affect. Her speech is normal and behavior is normal. Thought content normal. Cognition and memory are normal. She expresses impulsivity.    Review of Systems  Neurological: Negative.   Psychiatric/Behavioral: Negative.   All other systems reviewed and are negative.   Blood pressure 115/66, pulse (!) 101, temperature 98.4 F (36.9 C), temperature source Oral, resp. rate 18, height 5\' 6"  (1.676 m), weight 97.1 kg  (214 lb), SpO2 100 %.Body mass index is 34.54 kg/m.  General Appearance: Casual  Eye Contact:  Good  Speech:  Clear and Coherent  Volume:  Normal  Mood:  Euthymic  Affect:  Appropriate  Thought Process:  Goal Directed and Descriptions of Associations: Intact  Orientation:  Full (Time, Place, and Person)  Thought Content:  WDL  Suicidal Thoughts:  No  Homicidal Thoughts:  No  Memory:  Immediate;   Fair Recent;   Fair Remote;   Fair  Judgement:  Impaired  Insight:  Lacking  Psychomotor Activity:  Normal  Concentration:  Concentration: Fair and Attention Span: Fair  Recall:  [employer] of Knowledge:  Fair  Language:  Fair  Akathisia:  No  Handed:  Right  AIMS (if indicated):     Assets:  Communication Skills Desire for Improvement Financial Resources/Insurance Housing Physical Health Resilience Social Support  ADL's:  Intact  Cognition:  WNL  Sleep:  Number of Hours: 7.75     Treatment Plan Summary: Daily contact with patient to assess and evaluate symptoms and progress in treatment and Medication management   [name] is a 27 year old female with a history of schizoaffective disorder and self injurious behavior admitted after suicidal gesture by cutting her arm with a steak knife in the context of losing her place at the group home. No medication changes were offered. At the time of discharge, the patient is no longer suicidal. She is able to contract for safety. She is forward thinking and optimistic about the future.  #Mood/psychosis -continue Clozapine 150 mg nightly -continue Lithium 300 mg BID, Li0.54 -Trileptal 150 mg BID -Effexor 150 mg daily  #HTN -continue Lisinopril 2.5 mg daily  #Hypothyroidism -Synthroid 100 ug daily  #GERD -Protonix 40 mg daily  #Constipation -bowel regimen  #Skin cuts -Neosporin  #Social -incompetent adult -mother the guardian  #Labs -lipid panel, TSH and A1C are normal -EKG reviewed, NSR with QTc  437 -pregnancy test is negative  #Disposition -discharge to her group home -follow up with[facility] team    Orson Slick, MD 11/27/2017, 7:43 PM

## 2017-11-27 NOTE — [facility] Group Notes (Signed)
[place] Group Notes:  (Nursing/MHT/Case Management/Adjunct)  Date:  11/27/2017  Time:  10:33 PM  Type of Therapy:  Group Therapy  Participation Level:  Active  Participation Quality:  Appropriate  Affect:  Appropriate  Cognitive:  Appropriate  Insight:  Appropriate  Engagement in Group:  Engaged  Modes of Intervention:  Support  Summary of Progress/Problems:  Melanie Bell 11/27/2017, 10:33 PM

## 2017-11-27 NOTE — Progress Notes (Signed)
Recreation Therapy Notes  Date: 11/27/2017  Time: 9:30 am   Location: Craft Room   Behavioral response: N/A   Intervention Topic:  Stress  Discussion/Intervention: Patient did not attend group.   Clinical Observations/Feedback:  Patient did not attend group.   Daisy Mcneel LRT/CTRS        Lainee Lehrman 11/27/2017 11:46 AM

## 2017-11-27 NOTE — Progress Notes (Signed)
Recreation Therapy Notes  INPATIENT RECREATION TR PLAN  Patient Details Name: Kortnee Bas MRN: [id number] DOB: 23-Mar-1991 Today's Date: 11/27/2017  Rec Therapy Plan Is patient appropriate for Therapeutic Recreation?: Yes Treatment times per week: at least 3 Estimated Length of Stay: 5-7 days TR Treatment/Interventions: Group participation (Comment)  Discharge Criteria Pt will be discharged from therapy if:: Discharged Treatment plan/goals/alternatives discussed and agreed upon by:: Patient/family  Discharge Summary Short term goals set: Patient will successfully identify 2 ways of making healthy decisions post d/c within 5 recreation therapy group sessions Short term goals met: Adequate for discharge Progress toward goals comments: Groups attended Which groups?: Other (Comment)(Team work) Reason goals not met: N/A Therapeutic equipment acquired: N/A Reason patient discharged from therapy: Discharge from hospital Pt/family agrees with progress & goals achieved: Yes Date patient discharged from therapy: 11/27/17   Shemika Robbs 11/27/2017, 1:10 PM

## 2017-11-27 NOTE — Plan of Care (Signed)
Pt. Verbalizes understanding of provided education. Pt. Has no complaints during assessments and reports doing good. Pt. Compliant with medications. Pt. Sleeping good. Pt. Denies SI/HI. Pt. Verbally contracts for safety.    Problem: Education: Goal: Knowledge of the prescribed therapeutic regimen will improve Outcome: Progressing   Problem: Coping: Goal: Coping ability will improve Outcome: Progressing   Problem: Health Behavior/Discharge Planning: Goal: Compliance with therapeutic regimen will improve Outcome: Progressing   Problem: Education: Goal: Knowledge of [facility] information/materials will improve Outcome: Progressing   Problem: Activity: Goal: Sleeping patterns will improve Outcome: Progressing   Problem: Safety: Goal: Ability to remain free from injury will improve Outcome: Progressing

## 2017-11-28 LAB — CLOZAPINE (CLOZARIL)
CLOZAPINE LVL: 258 ng/mL — AB (ref 350–650)
NorClozapine: 56 ng/mL
Total(Cloz+Norcloz): 314 ng/mL

## 2017-11-28 NOTE — Progress Notes (Signed)
Patient ID: Melanie Bell, female   DOB: February 10, 1991, 27 y.o.   MRN: [id number] PER STATE REGULATIONS 482.30  THIS CHART WAS REVIEWED FOR MEDICAL NECESSITY WITH RESPECT TO THE PATIENT'S ADMISSION/ DURATION OF STAY.  NEXT REVIEW DATE: 12/01/2017 Chauncy Lean, RN, BSN CASE MANAGER

## 2017-11-28 NOTE — Plan of Care (Signed)
Patient is alert and oriented X4. Patient denies SI, HI and AVH. Patient affect is pleasant; logical and coherent; eye contact is fair; patient is in scrubs. Patient complains today of possible UTI. Patient mood today is flat. Patient is in the bed most of the morning sleeping. Patient is awaiting discharge. Nurse will continue to monitor. Problem: Education: Goal: Utilization of techniques to improve thought processes will improve Outcome: Completed/Met Goal: Knowledge of the prescribed therapeutic regimen will improve Outcome: Completed/Met   Problem: Coping: Goal: Coping ability will improve Outcome: Completed/Met Goal: Will verbalize feelings Outcome: Completed/Met   Problem: Safety: Goal: Ability to identify and utilize support systems that promote safety will improve Outcome: Completed/Met

## 2017-11-28 NOTE — [facility] Group Notes (Signed)
11/28/2017 1PM  Type of Therapy/Topic:  Group Therapy:  Feelings about Diagnosis  Participation Level:  Did Not Attend   Description of Group:   This group will allow patients to explore their thoughts and feelings about diagnoses they have received. Patients will be guided to explore their level of understanding and acceptance of these diagnoses. Facilitator will encourage patients to process their thoughts and feelings about the reactions of others to their diagnosis and will guide patients in identifying ways to discuss their diagnosis with significant others in their lives. This group will be process-oriented, with patients participating in exploration of their own experiences, giving and receiving support, and processing challenge from other group members.   Therapeutic Goals: 1. Patient will demonstrate understanding of diagnosis as evidenced by identifying two or more symptoms of the disorder 2. Patient will be able to express two feelings regarding the diagnosis 3. Patient will demonstrate their ability to communicate their needs through discussion and/or role play  Summary of Patient Progress: Patient was encouraged and invited to attend group. Patient did not attend group. Social worker will continue to encourage group participation in the future.        Therapeutic Modalities:   Cognitive Behavioral Therapy Brief Therapy Feelings Identification    Darin Engels, [place] 11/28/2017 2:50 PM

## 2017-11-28 NOTE — Progress Notes (Signed)
[facility] MD Progress Note  11/28/2017 3:58 PM Melanie Bell  MRN:  [id number]  Subjective:    Melanie Bell is ready for discharge. We were unable to get in touch with her guardian or group home.   Principal Problem: Schizoaffective disorder, bipolar type ([place]) Diagnosis:   Patient Active Problem List   Diagnosis Date Noted  . Schizoaffective disorder, bipolar type ([place]) [F25.0] 11/06/2014    Priority: High  . Self-inflicted injury [R74.08] 11/23/2017  . ASCUS of cervix with negative high risk HPV [R87.610] 08/28/2017  . Malingering [Z76.5] 05/06/2016  . Foreign body aspiration [T17.900A]   . Other foreign object in esophagus causing other injury, subsequent encounter [T18.198D]   . Swallowed foreign body [T18.9XXA] 04/23/2016  . Foreign body ingestion [T18.9XXA] 04/21/2016  . Gastric foreign body [T18.2XXA]   . 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 Tobacco use disorder [F17.200] 09/30/2014  . Hypothyroidism [E03.9] 09/29/2014  . Hypertension [I10] 09/29/2014   Total Time spent with patient: 15 minutes  Past Psychiatric History: schizoaffective disorder  Past Medical History:  Past Medical History:  Diagnosis Date  . Anxiety   . Asthma   . Depression   . GERD (gastroesophageal reflux disease)   . Hallucinations 09/30/2014   Sizoaffective  . Hyperlipidemia   . Hypertension   . Intentional ingestion of batteries 04/21/2016    2 AAA batteries and 1 thumb tack; intent to hurt herself/notes 04/21/2016  . Tardive dyskinesia 10/2014   recent onset    Past Surgical History:  Procedure Laterality Date  . ABDOMINAL SURGERY     "years ago" to remove foreign objects  . APPENDECTOMY    . BREAST EXCISIONAL BIOPSY Right 09/03/2007   surgical bx removed fibroadeoma  . BREAST LUMPECTOMY Right   . COLONOSCOPY WITH PROPOFOL N/A 09/10/2015   Procedure: COLONOSCOPY WITH PROPOFOL;  Surgeon: [name], MD;  Location: [facility] ENDOSCOPY;  Service: Endoscopy;  Laterality: N/A;  .  ESOPHAGOGASTRODUODENOSCOPY N/A 11/28/2014   Procedure: ESOPHAGOGASTRODUODENOSCOPY (EGD);  Surgeon: [name], MD;  Location[facility] ENDOSCOPY;  Service: Endoscopy;  Laterality: N/A;  . ESOPHAGOGASTRODUODENOSCOPY N/A 02/21/2016   Procedure: ESOPHAGOGASTRODUODENOSCOPY (EGD);  Surgeon: Mauri Pole, MD;  Location: [facility] ENDOSCOPY;  Service: Endoscopy;  Laterality: N/A;  . ESOPHAGOGASTRODUODENOSCOPY N/A 04/21/2016   Procedure: ESOPHAGOGASTRODUODENOSCOPY (EGD);  Surgeon: Gatha Mayer, MD;  Location: [facility] ENDOSCOPY;  Service: Endoscopy;  Laterality: N/A;  . ESOPHAGOGASTRODUODENOSCOPY N/A 05/06/2016   Procedure: ESOPHAGOGASTRODUODENOSCOPY (EGD);  Surgeon: Jonathon Bellows, MD;  Location: [facility] ENDOSCOPY;  Service: Gastroenterology;  Laterality: N/A;  . ESOPHAGOGASTRODUODENOSCOPY N/A 06/13/2016   Procedure: ESOPHAGOGASTRODUODENOSCOPY (EGD);  Surgeon: [name], MD;  Location: [facility] ENDOSCOPY;  Service: Endoscopy;  Laterality: N/A;  . ESOPHAGOGASTRODUODENOSCOPY (EGD) WITH PROPOFOL N/A 02/29/2016   Procedure: ESOPHAGOGASTRODUODENOSCOPY (EGD) WITH PROPOFOL;  Surgeon: [name], MD;  Location: [facility] ENDOSCOPY;  Service: Endoscopy;  Laterality: N/A;  . LAPAROTOMY N/A 09/12/2015   Procedure: EXPLORATORY LAPAROTOMY;  Surgeon: Florene Glen, MD;  Location: [facility] ORS;  Service: General;  Laterality: N/A;  . SIGMOIDOSCOPY N/A 09/12/2015   Procedure: Lonell Face;  Surgeon: Florene Glen, MD;  Location: [facility] ORS;  Service: General;  Laterality: N/A;  . WISDOM TOOTH EXTRACTION     Family History:  Family History  Problem Relation Age of Onset  . Depression Mother   . Hypertension Mother   . Sleep apnea Mother   . Asthma Mother   . COPD Mother   . Diabetes Mother   . Breast cancer Maternal Aunt  20's   Family Psychiatric  History: depression Social History:  Social History   Substance and Sexual Activity  Alcohol Use No     Social History   Substance and Sexual Activity  Drug Use No     Social History   Socioeconomic History  . Marital status: Single    Spouse name: Not on file  . Number of children: Not on file  . Years of education: Not on file  . Highest education level: Not on file  Occupational History  . Not on file  Social Needs  . Financial resource strain: Not on file  . Food insecurity:    Worry: Not on file    Inability: Not on file  . Transportation needs:    Medical: Not on file    Non-medical: Not on file  Tobacco Use  . Smoking status: Former Smoker    Packs/day: 0.50    Years: 3.00    Pack years: 1.50    Types: Cigarettes    Last attempt to quit: 08/2016    Years since quitting: 1.2  . Smokeless tobacco: Never Used  Substance and Sexual Activity  . Alcohol use: No  . Drug use: No  . Sexual activity: Never    Birth control/protection: Injection  Lifestyle  . Physical activity:    Days per week: Not on file    Minutes per session: Not on file  . Stress: Not on file  Relationships  . Social connections:    Talks on phone: Not on file    Gets together: Not on file    Attends religious service: Not on file    Active member of club or organization: Not on file    Attends meetings of clubs or organizations: Not on file    Relationship status: Not on file  Other Topics Concern  . Not on file  Social History Narrative  . Not on file   Additional Social History:                         Sleep: Fair  Appetite:  Fair  Current Medications: Current Facility-Administered Medications  Medication Dose Route Frequency Provider Last Rate Last Dose  . acetaminophen (TYLENOL) tablet 650 mg  650 mg Oral Q6H PRN [name], [name], MD   650 mg at 11/24/17 1710  . alum & mag hydroxide-simeth (MAALOX/MYLANTA) 200-200-20 MG/5ML suspension 30 mL  30 mL Oral Q4H PRN [name], [name], MD      . amLODipine (NORVASC) tablet 2.5 mg  2.5 mg Oral Daily [name], [name], MD   2.5 mg at 11/28/17 0846  . cetirizine HCl (Zyrtec) 5 MG/5ML solution 10 mg   10 mg Oral BID PRN [name], [name], MD      . cloNIDine (CATAPRES) tablet 0.1 mg  0.1 mg Oral BID PRN Dashonna Chagnon B, MD      . cloZAPine (CLOZARIL) tablet 150 mg  150 mg Oral QHS [name], [name], MD   150 mg at 11/27/17 2121  . hydrOXYzine (ATARAX/VISTARIL) tablet 50 mg  50 mg Oral Q6H PRN [name], [name], MD   50 mg at 11/23/17 2333  . levothyroxine (SYNTHROID, LEVOTHROID) tablet 100 mcg  100 mcg Oral QAC breakfast [name], [name], MD   100 mcg at 11/28/17 0847  . lithium carbonate (LITHOBID) CR tablet 300 mg  300 mg Oral BID WC [name], [name], MD   300 mg at 11/28/17 0847  .  magnesium hydroxide (MILK OF MAGNESIA) suspension 30 mL  30 mL Oral Daily PRN [name], [name], MD      . neomycin-bacitracin-polymyxin (NEOSPORIN) ointment   Topical Daily Kahne Helfand B, MD      . OXcarbazepine (TRILEPTAL) tablet 150 mg  150 mg Oral BID [name], [name], MD   150 mg at 11/28/17 1000  . pantoprazole (PROTONIX) EC tablet 40 mg  40 mg Oral Daily [name], [name], MD   40 mg at 11/28/17 0846  . polyethylene glycol (MIRALAX / GLYCOLAX) packet 17 g  17 g Oral Daily PRN [name], [name], MD      . senna (SENOKOT) tablet 8.6 mg  1 tablet Oral Daily PRN [name], [name], MD      . traZODone (DESYREL) tablet 50 mg  50 mg Oral QHS [name], [name], MD   50 mg at 11/27/17 2208  . venlafaxine XR (EFFEXOR-XR) 24 hr capsule 150 mg  150 mg Oral Daily [name], [name], MD   150 mg at 11/28/17 0846  . [START ON 12/07/2017] Vitamin D (Ergocalciferol) (DRISDOL) capsule 50,000 Units  50,000 Units Oral Weekly [name], [name], MD        Lab Results:  Results for orders placed or performed during the hospital encounter of 11/23/17 (from the past 48 hour(s))  CBC with Differential/Platelet     Status: None   Collection Time: 11/27/17 10:43 AM  Result Value Ref Range   WBC 6.2 3.6 - 11.0 K/uL   RBC 4.23 3.80 - 5.20 MIL/uL   Hemoglobin 13.0 12.0 - 16.0 g/dL   HCT 38.3 35.0 - 47.0 %   MCV 90.6 80.0 - 100.0 fL   MCH  30.6 26.0 - 34.0 pg   MCHC 33.8 32.0 - 36.0 g/dL   RDW 13.3 11.5 - 14.5 %   Platelets 203 150 - 440 K/uL   Neutrophils Relative % 68 %   Neutro Abs 4.1 1.4 - 6.5 K/uL   Lymphocytes Relative 24 %   Lymphs Abs 1.5 1.0 - 3.6 K/uL   Monocytes Relative 6 %   Monocytes Absolute 0.4 0.2 - 0.9 K/uL   Eosinophils Relative 2 %   Eosinophils Absolute 0.1 0 - 0.7 K/uL   Basophils Relative 0 %   Basophils Absolute 0.0 0 - 0.1 K/uL    Comment: Performed at [facility], [address] [place], [place] [address]    Blood Alcohol level:  Lab Results  Component Value Date   [facility] <10 11/23/2017   ETH <5 60/45/4098    Metabolic Disorder Labs: Lab Results  Component Value Date   HGBA1C 5.1 11/23/2017   MPG 99.67 11/23/2017   MPG 103 03/03/2016   Lab Results  Component Value Date   PROLACTIN 64.6 (H) 03/04/2016   PROLACTIN 15.8 06/16/2015   Lab Results  Component Value Date   CHOL 173 11/23/2017   TRIG 77 11/23/2017   HDL 41 11/23/2017   CHOLHDL 4.2 11/23/2017   VLDL 15 11/23/2017   LDLCALC 117 (H) 11/23/2017   LDLCALC 127 (H) 03/03/2016    Physical Findings: AIMS:  , ,  ,  ,    CIWA:    COWS:     Musculoskeletal: Strength & Muscle Tone: within normal limits Gait & Station: normal Patient leans: N/A  Psychiatric Specialty Exam: Physical Exam  Nursing note and vitals reviewed. Psychiatric: She has a normal mood and affect. Her speech is normal and behavior is normal. Thought content normal.  Cognition and memory are normal. She expresses impulsivity.    Review of Systems  Neurological: Negative.   Psychiatric/Behavioral: Negative.   All other systems reviewed and are negative.   Blood pressure 112/70, pulse 73, temperature 98.7 F (37.1 C), temperature source Oral, resp. rate 18, height 5\' 6"  (1.676 m), weight 97.1 kg (214 lb), SpO2 95 %.Body mass index is 34.54 kg/m.  General Appearance: Casual  Eye Contact:  Good  Speech:  Clear and Coherent  Volume:   Normal  Mood:  Euthymic  Affect:  Appropriate  Thought Process:  Goal Directed and Descriptions of Associations: Intact  Orientation:  Full (Time, Place, and Person)  Thought Content:  WDL  Suicidal Thoughts:  No  Homicidal Thoughts:  No  Memory:  Immediate;   Fair Recent;   Fair Remote;   Fair  Judgement:  Poor  Insight:  Lacking  Psychomotor Activity:  Normal  Concentration:  Concentration: Fair and Attention Span: Fair  Recall:  [employer] of Knowledge:  Fair  Language:  Fair  Akathisia:  No  Handed:  Right  AIMS (if indicated):     Assets:  Communication Skills Desire for Improvement Financial Resources/Insurance Housing Physical Health Resilience Social Support  ADL's:  Intact  Cognition:  WNL  Sleep:  Number of Hours: 8.5     Treatment Plan Summary: Daily contact with patient to assess and evaluate symptoms and progress in treatment and Medication management   Melanie Bell is a 27 year old female with a history of schizoaffective disorder and self injurious behavior admitted after suicidal gesture by cutting her arm with a steak knife in the context of losing her place at the group home. No medication changes were offered. At the time of discharge, the patient is no longer suicidal. She is able to contract for safety. She is forward thinking and optimistic about the future.  #Mood/psychosis -continue Clozapine 150 mg nightly -continue Lithium 300 mg BID, Li0.54 -Trileptal 150 mg BID -Effexor 150 mg daily  #HTN -continue Lisinopril 2.5 mg daily  #Hypothyroidism -Synthroid 100 ug daily  #GERD -Protonix 40 mg daily  #Constipation -bowel regimen  #Skin cuts -Neosporin  #Social -incompetent adult -mother the guardian  #Labs -lipid panel, TSH and A1C are normal -EKG reviewed, NSR with QTc 437 -pregnancy test is negative  #Disposition -discharge to her group home -[facility]     Orson Slick, MD 11/28/2017,  3:58 PM

## 2017-11-28 NOTE — [facility] Group Notes (Signed)
CSW Group Therapy Note  11/28/2017  Time:  0900  Type of Therapy and Topic: Group Therapy: Goals Group: SMART Goals    Participation Level:  Did Not Attend    Description of Group:   The purpose of a daily goals group is to assist and guide patients in setting recovery/wellness-related goals. The objective is to set goals as they relate to the crisis in which they were admitted. Patients will be using SMART goal modalities to set measurable goals. Characteristics of realistic goals will be discussed and patients will be assisted in setting and processing how one will reach their goal. Facilitator will also assist patients in applying interventions and coping skills learned in psycho-education groups to the SMART goal and process how one will achieve defined goal.    Therapeutic Goals:  -Patients will develop and document one goal related to or their crisis in which brought them into treatment.  -Patients will be guided by LCSW using SMART goal setting modality in how to set a measurable, attainable, realistic and time sensitive goal.  -Patients will process barriers in reaching goal.  -Patients will process interventions in how to overcome and successful in reaching goal.    Patient's Goal:  Pt was invited to attend group but chose not to attend. CSW will continue to encourage pt to attend group throughout their admission.   Therapeutic Modalities:  Motivational Interviewing  Cognitive Behavioral Therapy  Crisis Intervention Model  SMART goals setting  Jianni Batten, MSW, LCSW Clinical Social Worker 11/28/2017 9:39 AM   

## 2017-11-28 NOTE — Progress Notes (Signed)
Recreation Therapy Notes  Date: 11/28/2017  Time: 9:30 am   Location: Craft Room   Behavioral response: N/A   Intervention Topic:  Relaxation  Discussion/Intervention: Patient did not attend group.   Clinical Observations/Feedback:  Patient did not attend group.   Mareesa Gathright LRT/CTRS        Quinlyn Tep 11/28/2017 10:46 AM 

## 2017-11-28 NOTE — Progress Notes (Signed)
Patient remained compliant with unit expectations. Was in the milieu, alert and oriented. Thought process organized. Denying thoughts of self harm. Attended group and remained appropriate. Patient had a snack then presented to the medication room: received medications and discussed about her previous self harm behaviors "I know I was wrong, and I am ashamed...but I am not planning to do it again, I learned a lesson..." Patient was pleasant and cooperative. Complained of possible UTI and would like to discuss it with MD in AM. No other medical/health concerns. Support and encouragements provided. Safety precautions maintained.

## 2017-11-28 NOTE — Plan of Care (Signed)
Active in the milieu, compliant with treatment 

## 2017-11-29 LAB — URINALYSIS, COMPLETE (UACMP) WITH MICROSCOPIC
Bilirubin Urine: NEGATIVE
GLUCOSE, UA: NEGATIVE mg/dL
KETONES UR: NEGATIVE mg/dL
Nitrite: NEGATIVE
PH: 8.5 — AB (ref 5.0–8.0)
Protein, ur: NEGATIVE mg/dL
SPECIFIC GRAVITY, URINE: 1.015 (ref 1.005–1.030)

## 2017-11-29 MED ORDER — CEPHALEXIN 500 MG PO CAPS
500.0000 mg | ORAL_CAPSULE | Freq: Two times a day (BID) | ORAL | Status: DC
  Administered 2017-11-29 – 2017-12-06 (×15): 500 mg via ORAL
  Filled 2017-11-29 (×15): qty 1

## 2017-11-29 MED ORDER — PHENAZOPYRIDINE HCL 100 MG PO TABS
100.0000 mg | ORAL_TABLET | Freq: Three times a day (TID) | ORAL | Status: DC
  Administered 2017-11-29 – 2017-12-03 (×13): 100 mg via ORAL
  Filled 2017-11-29 (×16): qty 1

## 2017-11-29 NOTE — Progress Notes (Signed)
[facility] MD Progress Note  11/29/2017 12:55 PM Melanie Bell  MRN:  [id number]  Subjective:    Melanie Bell has been ready for disacharge. We have not been able to contact her guardian or group home. ACT team nurse went by the house but found noone.  Melanie Bell now complains of UTI with burning and difficulties urinating. UA, UCx, Keflex 500 mg BID,   Principal Problem: Schizoaffective disorder, bipolar type ([place]) Diagnosis:   Patient Active Problem List   Diagnosis Date Noted  . Schizoaffective disorder, bipolar type ([place]) [F25.0] 11/06/2014    Priority: High  . Self-inflicted injury [O27.74] 11/23/2017  . ASCUS of cervix with negative high risk HPV [R87.610] 08/28/2017  . Malingering [Z76.5] 05/06/2016  . Foreign body aspiration [T17.900A]   . Other foreign object in esophagus causing other injury, subsequent encounter [T18.198D]   . Swallowed foreign body [T18.9XXA] 04/23/2016  . Foreign body ingestion [T18.9XXA] 04/21/2016  . Gastric foreign body [T18.2XXA]   . 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 Tobacco use disorder [F17.200] 09/30/2014  . Hypothyroidism [E03.9] 09/29/2014  . Hypertension [I10] 09/29/2014   Total Time spent with patient: 20 minutes  Past Psychiatric History: schizoaffective disorder   Past Medical History:  Past Medical History:  Diagnosis Date  . Anxiety   . Asthma   . Depression   . GERD (gastroesophageal reflux disease)   . Hallucinations 09/30/2014   Sizoaffective  . Hyperlipidemia   . Hypertension   . Intentional ingestion of batteries 04/21/2016    2 AAA batteries and 1 thumb tack; intent to hurt herself/notes 04/21/2016  . Tardive dyskinesia 10/2014   recent onset    Past Surgical History:  Procedure Laterality Date  . ABDOMINAL SURGERY     "years ago" to remove foreign objects  . APPENDECTOMY    . BREAST EXCISIONAL BIOPSY Right 09/03/2007   surgical bx removed fibroadeoma  . BREAST LUMPECTOMY Right   . COLONOSCOPY WITH PROPOFOL N/A  09/10/2015   Procedure: COLONOSCOPY WITH PROPOFOL;  Surgeon: [name], MD;  Location: [facility] ENDOSCOPY;  Service: Endoscopy;  Laterality: N/A;  . ESOPHAGOGASTRODUODENOSCOPY N/A 11/28/2014   Procedure: ESOPHAGOGASTRODUODENOSCOPY (EGD);  Surgeon: [name], MD;  Location: [facility] ENDOSCOPY;  Service: Endoscopy;  Laterality: N/A;  . ESOPHAGOGASTRODUODENOSCOPY N/A 02/21/2016   Procedure: ESOPHAGOGASTRODUODENOSCOPY (EGD);  Surgeon: Mauri Pole, MD;  Location: [facility] ENDOSCOPY;  Service: Endoscopy;  Laterality: N/A;  . ESOPHAGOGASTRODUODENOSCOPY N/A 04/21/2016   Procedure: ESOPHAGOGASTRODUODENOSCOPY (EGD);  Surgeon: Gatha Mayer, MD;  Location: [facility] ENDOSCOPY;  Service: Endoscopy;  Laterality: N/A;  . ESOPHAGOGASTRODUODENOSCOPY N/A 05/06/2016   Procedure: ESOPHAGOGASTRODUODENOSCOPY (EGD);  Surgeon: Jonathon Bellows, MD;  Location: [facility] ENDOSCOPY;  Service: Gastroenterology;  Laterality: N/A;  . ESOPHAGOGASTRODUODENOSCOPY N/A 06/13/2016   Procedure: ESOPHAGOGASTRODUODENOSCOPY (EGD);  Surgeon: [name], MD;  Location: [facility] ENDOSCOPY;  Service: Endoscopy;  Laterality: N/A;  . ESOPHAGOGASTRODUODENOSCOPY (EGD) WITH PROPOFOL N/A 02/29/2016   Procedure: ESOPHAGOGASTRODUODENOSCOPY (EGD) WITH PROPOFOL;  Surgeon: [name], MD;  Location: [facility] ENDOSCOPY;  Service: Endoscopy;  Laterality: N/A;  . LAPAROTOMY N/A 09/12/2015   Procedure: EXPLORATORY LAPAROTOMY;  Surgeon: Florene Glen, MD;  Location: [facility] ORS;  Service: General;  Laterality: N/A;  . SIGMOIDOSCOPY N/A 09/12/2015   Procedure: Lonell Face;  Surgeon: Florene Glen, MD;  Location: [facility] ORS;  Service: General;  Laterality: N/A;  . WISDOM TOOTH EXTRACTION     Family History:  Family History  Problem Relation Age of Onset  . Depression Mother   . Hypertension Mother   .  Sleep apnea Mother   . Asthma Mother   . COPD Mother   . Diabetes Mother   . Breast cancer Maternal Aunt        20's   Family Psychiatric  History:  depression Social History:  Social History   Substance and Sexual Activity  Alcohol Use No     Social History   Substance and Sexual Activity  Drug Use No    Social History   Socioeconomic History  . Marital status: Single    Spouse name: Not on file  . Number of children: Not on file  . Years of education: Not on file  . Highest education level: Not on file  Occupational History  . Not on file  Social Needs  . Financial resource strain: Not on file  . Food insecurity:    Worry: Not on file    Inability: Not on file  . Transportation needs:    Medical: Not on file    Non-medical: Not on file  Tobacco Use  . Smoking status: Former Smoker    Packs/day: 0.50    Years: 3.00    Pack years: 1.50    Types: Cigarettes    Last attempt to quit: 08/2016    Years since quitting: 1.2  . Smokeless tobacco: Never Used  Substance and Sexual Activity  . Alcohol use: No  . Drug use: No  . Sexual activity: Never    Birth control/protection: Injection  Lifestyle  . Physical activity:    Days per week: Not on file    Minutes per session: Not on file  . Stress: Not on file  Relationships  . Social connections:    Talks on phone: Not on file    Gets together: Not on file    Attends religious service: Not on file    Active member of club or organization: Not on file    Attends meetings of clubs or organizations: Not on file    Relationship status: Not on file  Other Topics Concern  . Not on file  Social History Narrative  . Not on file   Additional Social History:                         Sleep: Fair  Appetite:  Fair  Current Medications: Current Facility-Administered Medications  Medication Dose Route Frequency Provider Last Rate Last Dose  . acetaminophen (TYLENOL) tablet 650 mg  650 mg Oral Q6H PRN [name], [name], MD   650 mg at 11/24/17 1710  . alum & mag hydroxide-simeth (MAALOX/MYLANTA) 200-200-20 MG/5ML suspension 30 mL  30 mL Oral Q4H PRN [name],  [name], MD      . amLODipine (NORVASC) tablet 2.5 mg  2.5 mg Oral Daily [name], [name], MD   2.5 mg at 11/28/17 0846  . cephALEXin (KEFLEX) capsule 500 mg  500 mg Oral Q12H Keaghan Staton B, MD   500 mg at 11/29/17 0835  . cetirizine HCl (Zyrtec) 5 MG/5ML solution 10 mg  10 mg Oral BID PRN [name], [name], MD      . cloNIDine (CATAPRES) tablet 0.1 mg  0.1 mg Oral BID PRN Reighn Kaplan B, MD      . cloZAPine (CLOZARIL) tablet 150 mg  150 mg Oral QHS [name], [name], MD   150 mg at 11/28/17 2129  . hydrOXYzine (ATARAX/VISTARIL) tablet 50 mg  50 mg Oral Q6H PRN [name], [name], MD   50 mg at  11/23/17 2333  . levothyroxine (SYNTHROID, LEVOTHROID) tablet 100 mcg  100 mcg Oral QAC breakfast [name], [name], MD   100 mcg at 11/29/17 0834  . lithium carbonate (LITHOBID) CR tablet 300 mg  300 mg Oral BID WC [name], [name], MD   300 mg at 11/29/17 0844  . magnesium hydroxide (MILK OF MAGNESIA) suspension 30 mL  30 mL Oral Daily PRN [name], [name], MD      . neomycin-bacitracin-polymyxin (NEOSPORIN) ointment   Topical Daily Eryc Bodey B, MD      . OXcarbazepine (TRILEPTAL) tablet 150 mg  150 mg Oral BID [name], [name], MD   150 mg at 11/29/17 1145  . pantoprazole (PROTONIX) EC tablet 40 mg  40 mg Oral Daily [name], [name], MD   40 mg at 11/29/17 0834  . phenazopyridine (PYRIDIUM) tablet 100 mg  100 mg Oral TID WC Colter Magowan B, MD      . polyethylene glycol (MIRALAX / GLYCOLAX) packet 17 g  17 g Oral Daily PRN [name], [name], MD      . senna (SENOKOT) tablet 8.6 mg  1 tablet Oral Daily PRN [name], [name], MD      . traZODone (DESYREL) tablet 50 mg  50 mg Oral QHS [name], [name], MD   50 mg at 11/28/17 2129  . venlafaxine XR (EFFEXOR-XR) 24 hr capsule 150 mg  150 mg Oral Daily [name], [name], MD   150 mg at 11/29/17 0834  . [START ON 12/07/2017] Vitamin D (Ergocalciferol) (DRISDOL) capsule 50,000 Units  50,000 Units Oral Weekly [name], [name], MD        Lab Results:   Results for orders placed or performed during the hospital encounter of 11/23/17 (from the past 48 hour(s))  Urinalysis, Complete w Microscopic     Status: Abnormal   Collection Time: 11/29/17 10:48 AM  Result Value Ref Range   Color, Urine YELLOW YELLOW   APPearance CLEAR CLEAR   Specific Gravity, Urine 1.015 1.005 - 1.030   pH 8.5 (H) 5.0 - 8.0   Glucose, UA NEGATIVE NEGATIVE mg/dL   Hgb urine dipstick TRACE (A) NEGATIVE   Bilirubin Urine NEGATIVE NEGATIVE   Ketones, ur NEGATIVE NEGATIVE mg/dL   Protein, ur NEGATIVE NEGATIVE mg/dL   Nitrite NEGATIVE NEGATIVE   Leukocytes, UA TRACE (A) NEGATIVE   Squamous Epithelial / LPF 6-10 0 - 5   WBC, UA 0-5 0 - 5 WBC/hpf   RBC / HPF 0-5 0 - 5 RBC/hpf   Bacteria, UA MANY (A) NONE SEEN   Mucus PRESENT     Comment: Performed at [facility], [address] [place], [place] [address]    Blood Alcohol level:  Lab Results  Component Value Date   [facility] <10 11/23/2017   ETH <5 76/28/3151    Metabolic Disorder Labs: Lab Results  Component Value Date   HGBA1C 5.1 11/23/2017   MPG 99.67 11/23/2017   MPG 103 03/03/2016   Lab Results  Component Value Date   PROLACTIN 64.6 (H) 03/04/2016   PROLACTIN 15.8 06/16/2015   Lab Results  Component Value Date   CHOL 173 11/23/2017   TRIG 77 11/23/2017   HDL 41 11/23/2017   CHOLHDL 4.2 11/23/2017   VLDL 15 11/23/2017   LDLCALC 117 (H) 11/23/2017   LDLCALC 127 (H) 03/03/2016    Physical Findings: AIMS:  , ,  ,  ,    CIWA:    COWS:  Musculoskeletal: Strength & Muscle Tone: within normal limits Gait & Station: normal Patient leans: N/A  Psychiatric Specialty Exam: Physical Exam  Nursing note and vitals reviewed. Psychiatric: She has a normal mood and affect. Her speech is normal and behavior is normal. Thought content normal. Cognition and memory are normal. She expresses impulsivity.    Review of Systems  Neurological: Negative.   Psychiatric/Behavioral: Negative.    All other systems reviewed and are negative.   Blood pressure 100/60, pulse 76, temperature 98.7 F (37.1 C), temperature source Oral, resp. rate 18, height 5\' 6"  (1.676 m), weight 97.1 kg (214 lb), SpO2 98 %.Body mass index is 34.54 kg/m.  General Appearance: Casual  Eye Contact:  Good  Speech:  Clear and Coherent  Volume:  Normal  Mood:  Euthymic  Affect:  Appropriate  Thought Process:  Goal Directed and Descriptions of Associations: Intact  Orientation:  Full (Time, Place, and Person)  Thought Content:  WDL  Suicidal Thoughts:  No  Homicidal Thoughts:  No  Memory:  Immediate;   Fair Recent;   Fair Remote;   Fair  Judgement:  Poor  Insight:  Lacking  Psychomotor Activity:  Normal  Concentration:  Concentration: Fair and Attention Span: Fair  Recall:  [employer] of Knowledge:  Fair  Language:  Fair  Akathisia:  No  Handed:  Right  AIMS (if indicated):     Assets:  Communication Skills Desire for Improvement Financial Resources/Insurance Housing Physical Health Resilience Social Support  ADL's:  Intact  Cognition:  WNL  Sleep:  Number of Hours: 7.5     Treatment Plan Summary: Daily contact with patient to assess and evaluate symptoms and progress in treatment and Medication management   Melanie Bell is a 27 year old female with a history of schizoaffective disorder and self injurious behavior admitted after suicidal gesture by cutting her arm with a steak knife in the context of losing her place at the group home.No medication changes were offered. At the time of discharge, the patient is no longer suicidal. She is able to contract for safety. She is forward thinking and optimistic about the future.  #Mood/psychosis -continue Clozapine 150 mg nightly -continue Lithium 300 mg BID, Li0.54 -Trileptal 150 mg BID -Effexor 150 mg daily  #HTN -continueLisinopril 2.5 mg daily  #Hypothyroidism -Synthroid 100 ug daily  #GERD -Protonix 40 mg  daily  #Constipation -bowel regimen  #Skin cuts -Neosporin  #UTI -Keflex 500 mg BID -Pyridium TID  #Social -incompetent adult -mother the guardian  #Labs -lipid panel, TSH and A1Care normal -EKGreviewed, NSR with QTc 437 -pregnancy test is negative  #Disposition -discharge to her group home -[facility] up [facility]     Orson Slick, MD 11/29/2017, 12:55 PM

## 2017-11-29 NOTE — Tx Team (Signed)
Interdisciplinary Treatment and Diagnostic Plan Update  11/29/2017 Time of Session: 10:30am Gail Vendetti MRN: [id number]  Principal Diagnosis: Schizoaffective disorder, bipolar type [facility])  Secondary Diagnoses: Principal Problem:   Schizoaffective disorder, bipolar type ([place]) Active Problems:   Hypothyroidism   Hypertension   Self-inflicted injury   Current Medications:  Current Facility-Administered Medications  Medication Dose Route Frequency Provider Last Rate Last Dose  . acetaminophen (TYLENOL) tablet 650 mg  650 mg Oral Q6H PRN [name], [name], MD   650 mg at 11/24/17 1710  . alum & mag hydroxide-simeth (MAALOX/MYLANTA) 200-200-20 MG/5ML suspension 30 mL  30 mL Oral Q4H PRN [name], [name], MD      . amLODipine (NORVASC) tablet 2.5 mg  2.5 mg Oral Daily [name], [name], MD   2.5 mg at 11/28/17 0846  . cephALEXin (KEFLEX) capsule 500 mg  500 mg Oral Q12H [name], [name], MD   500 mg at 11/29/17 0835  . cetirizine HCl (Zyrtec) 5 MG/5ML solution 10 mg  10 mg Oral BID PRN [name], [name], MD      . cloZAPine (CLOZARIL) tablet 150 mg  150 mg Oral QHS [name], [name], MD   150 mg at 11/28/17 2129  . hydrOXYzine (ATARAX/VISTARIL) tablet 50 mg  50 mg Oral Q6H PRN [name], [name], MD   50 mg at 11/23/17 2333  . levothyroxine (SYNTHROID, LEVOTHROID) tablet 100 mcg  100 mcg Oral QAC breakfast [name], [name], MD   100 mcg at 11/29/17 0834  . lithium carbonate (LITHOBID) CR tablet 300 mg  300 mg Oral BID WC [name], [name], MD   300 mg at 11/29/17 0844  . magnesium hydroxide (MILK OF MAGNESIA) suspension 30 mL  30 mL Oral Daily PRN [name], [name], MD      . neomycin-bacitracin-polymyxin (NEOSPORIN) ointment   Topical Daily [name], [name], MD      . OXcarbazepine (TRILEPTAL) tablet 150 mg  150 mg Oral BID [name], [name], MD   150 mg at 11/29/17 1145  . pantoprazole (PROTONIX) EC tablet 40 mg  40 mg Oral Daily [name], [name], MD   40 mg at 11/29/17 0834  .  phenazopyridine (PYRIDIUM) tablet 100 mg  100 mg Oral TID WC [name], [name], MD      . polyethylene glycol (MIRALAX / GLYCOLAX) packet 17 g  17 g Oral Daily PRN [name], [name], MD      . senna (SENOKOT) tablet 8.6 mg  1 tablet Oral Daily PRN [name], [name], MD      . traZODone (DESYREL) tablet 50 mg  50 mg Oral QHS [name], [name], MD   50 mg at 11/28/17 2129  . venlafaxine XR (EFFEXOR-XR) 24 hr capsule 150 mg  150 mg Oral Daily [name], [name], MD   150 mg at 11/29/17 0834  . [START ON 12/07/2017] Vitamin D (Ergocalciferol) (DRISDOL) capsule 50,000 Units  50,000 Units Oral Weekly [name], [name], MD       PTA Medications: Medications Prior to Admission  Medication Sig Dispense Refill Last Dose  . amLODipine (NORVASC) 2.5 MG tablet Take 1 tablet (2.5 mg total) by mouth daily. 30 tablet 0 11/22/2017 at Unknown time  . cetirizine (ZYRTEC) 10 MG tablet Take 10 mg by mouth daily as needed for allergies.   prn at prn  . clozapine (CLOZARIL) 50 MG tablet Take 150 mg by mouth.   11/22/2017 at Unknown time  . hydrOXYzine (ATARAX/VISTARIL) 50 MG tablet Take  50 mg by mouth 2 (two) times daily as needed.   prn at prn  . levothyroxine (SYNTHROID, LEVOTHROID) 100 MCG tablet Take 100 mcg by mouth daily before breakfast.   11/22/2017 at Unknown time  . lithium carbonate 300 MG capsule Take 300 mg by mouth 2 (two) times daily with a meal.    11/22/2017 at Unknown time  . medroxyPROGESTERone (DEPO-PROVERA) 150 MG/ML injection Inject 150 mg into the muscle every 3 (three) months.   unknowbn at unknown  . omeprazole (PRILOSEC) 20 MG capsule Take 20 mg by mouth daily.   11/22/2017 at Unknown time  . OXcarbazepine (TRILEPTAL) 150 MG tablet Take 1 tablet (150 mg total) by mouth 2 (two) times daily. (Patient taking differently: Take 300 mg by mouth 2 (two) times daily. ) 60 tablet 0 11/22/2017 at Unknown time  . polyethylene glycol (MIRALAX / GLYCOLAX) packet Take 17 g by mouth daily as needed.   prn at prn  . senna  (SENOKOT) 8.6 MG TABS tablet Take 1 tablet by mouth daily as needed for mild constipation.   prn at prn  . traZODone (DESYREL) 50 MG tablet Take 50 mg by mouth at bedtime.   prn at prn  . venlafaxine XR (EFFEXOR-XR) 150 MG 24 hr capsule Take 1 capsule (150 mg total) by mouth daily with breakfast. 30 capsule 0 11/22/2017 at Unknown time  . Vitamin D, Ergocalciferol, (DRISDOL) 50000 units CAPS capsule Take 50,000 Units by mouth every 7 (seven) days.   Past Week at Unknown time    Patient Stressors: Traumatic event Other: Uncontrolled Anger Displacement aeb Self Inflicting Wounds  Patient Strengths: Ability for insight Motivation for treatment/growth  Treatment Modalities: Medication Management, Group therapy, Case management,  1 to 1 session with clinician, Psychoeducation, Recreational therapy.   Physician Treatment Plan for Primary Diagnosis: Schizoaffective disorder, bipolar type ([place]) Long Term Goal(s): Improvement in symptoms so as ready for discharge Improvement in symptoms so as ready for discharge   Short Term Goals: Ability to identify changes in lifestyle to reduce recurrence of condition will improve Ability to verbalize feelings will improve Ability to disclose and discuss suicidal ideas Ability to demonstrate self-control will improve Ability to identify and develop effective coping behaviors will improve Ability to identify triggers associated with substance abuse/mental health issues will improve Ability to identify changes in lifestyle to reduce recurrence of condition will improve Ability to demonstrate self-control will improve Ability to identify triggers associated with substance abuse/mental health issues will improve  Medication Management: Evaluate patient's response, side effects, and tolerance of medication regimen.  Therapeutic Interventions: 1 to 1 sessions, Unit Group sessions and Medication administration.  Evaluation of Outcomes: Progressing  Physician  Treatment Plan for Secondary Diagnosis: Principal Problem:   Schizoaffective disorder, bipolar type ([place]) Active Problems:   Hypothyroidism   Hypertension   Self-inflicted injury  Long Term Goal(s): Improvement in symptoms so as ready for discharge Improvement in symptoms so as ready for discharge   Short Term Goals: Ability to identify changes in lifestyle to reduce recurrence of condition will improve Ability to verbalize feelings will improve Ability to disclose and discuss suicidal ideas Ability to demonstrate self-control will improve Ability to identify and develop effective coping behaviors will improve Ability to identify triggers associated with substance abuse/mental health issues will improve Ability to identify changes in lifestyle to reduce recurrence of condition will improve Ability to demonstrate self-control will improve Ability to identify triggers associated with substance abuse/mental health issues will improve  Medication Management: Evaluate patient's response, side effects, and tolerance of medication regimen.  Therapeutic Interventions: 1 to 1 sessions, Unit Group sessions and Medication administration.  Evaluation of Outcomes: Progressing   RN Treatment Plan for Primary Diagnosis: Schizoaffective disorder, bipolar type ([place]) Long Term Goal(s): Knowledge of disease and therapeutic regimen to maintain health will improve  Short Term Goals: Ability to demonstrate self-control, Ability to disclose and discuss suicidal ideas, Ability to identify and develop effective coping behaviors will improve and Compliance with prescribed medications will improve  Medication Management: RN will administer medications as ordered by provider, will assess and evaluate patient's response and provide education to patient for prescribed medication. RN will report any adverse and/or side effects to prescribing provider.  Therapeutic Interventions: 1 on 1 counseling sessions,  Psychoeducation, Medication administration, Evaluate responses to treatment, Monitor vital signs and CBGs as ordered, Perform/monitor CIWA, COWS, AIMS and Fall Risk screenings as ordered, Perform wound care treatments as ordered.  Evaluation of Outcomes: Progressing   LCSW Treatment Plan for Primary Diagnosis: Schizoaffective disorder, bipolar type ([place]) Long Term Goal(s): Safe transition to appropriate next level of care at discharge, Engage patient in therapeutic group addressing interpersonal concerns.  Short Term Goals: Engage patient in aftercare planning with referrals and resources, Increase social support, Increase emotional regulation, Identify triggers associated with mental health/substance abuse issues and Increase skills for wellness and recovery  Therapeutic Interventions: Assess for all discharge needs, 1 to 1 time with Social worker, Explore available resources and support systems, Assess for adequacy in community support network, Educate family and significant other(s) on suicide prevention, Complete Psychosocial Assessment, Interpersonal group therapy.  Evaluation of Outcomes: Progressing   Progress in Treatment: Attending groups: No. Participating in groups: No. Taking medication as prescribed: Yes. Toleration medication: Yes. Family/Significant other contact made: No, will contact:  Pts mother  Patient understands diagnosis: Yes. Discussing patient identified problems/goals with staff: Yes. Medical problems stabilized or resolved: Yes. Denies suicidal/homicidal ideation: Yes. Issues/concerns per patient self-inventory: No. Other:   New problem(s) identified: No, Describe:  None  New Short Term/Long Term Goal(s): "Start thinking before I act and control my anger."  Patient Goals:  "Start thinking before I act and control my anger."  Discharge Plan or Barriers: To return back to her group home and follow up with her ACTT team.  Reason for Continuation of  Hospitalization: Depression Medication stabilization  Estimated Length of Stay: 0-2 days  Recreational Therapy: Patient Stressors: The group home staff  Patient Goal: Patient will successfully identify 2 ways of making healthy decisions post d/c within 5 recreation therapy group sessions  Attendees: Patient: Melanie Bell 11/29/2017 2:57 PM  Physician: Dr. Bary Leriche, MD 11/29/2017 2:57 PM  Nursing: Eugenio Hoes, RN  11/29/2017 2:57 PM  RN Care Manager: 11/29/2017 2:57 PM  Social Worker:Dustyn Dansereau, LCSW 11/29/2017 2:57 PM  Recreational Therapist: Isaias Sakai. Marcello Fennel, LRT 11/29/2017 2:57 PM  Other: Alden Hipp, LCSW 11/29/2017 2:57 PM  Other: Darin Engels,  [place] 11/29/2017 2:57 PM  Other: 11/29/2017 2:57 PM    Scribe for Treatment Team: [name], LCSW 11/29/2017 2:57 PM

## 2017-11-29 NOTE — Progress Notes (Signed)
Nadiah slept all night and had no sign of discomfort while asleep. Stayed in the milieu until bed time and participated in evening activities. Alert and oriented and denying suicidal/homicidal thoughts. Denying hallucinations. Continues to complain of "UTI", reporting that she now is unable to urinate and has vaginal itching. Reported that she is drinking cranberry juice to ease the feeling. Patient is otherwise cooperative and compliant with unit expectations. Support and encouragements provided. Patient was encouraged to discuss her feelings with MD in AM.

## 2017-11-29 NOTE — Progress Notes (Signed)
Patient ID: Melanie Bell, female   DOB: 1991-01-27, 27 y.o.   MRN: [id number] CSW has made several attempts to contact group home and guardian over the past 2 days.  Left voicemails and none have been returned.  CSW spoke with Eligha Bridegroom with [facility].  She has no other numbers to try and has also been calling without response.  She says she drove out to the group home yesterday and there was no one there.  CSW spoke with her again today and she says she will be going out to one of the other homes to see if she can find out what's going on.  Dossie Arbour, LCSW

## 2017-11-29 NOTE — Progress Notes (Signed)
Recreation Therapy Notes  Date: 11/29/2017  Time: 9:30 am   Location: Craft Room   Behavioral response: N/A   Intervention Topic:  Goals  Discussion/Intervention: Patient did not attend group.   Clinical Observations/Feedback:  Patient did not attend group.   Jocelin Schuelke LRT/CTRS        Melanie Bell 11/29/2017 11:20 AM 

## 2017-11-29 NOTE — Progress Notes (Signed)
Recreation Therapy Notes  Date: 11/29/2017  Time: 3:00pm  Location: Craft room  Behavioral response: Appropriate  Group Type: Game  Participation level: Active  Communication: Patient was social with peers and staff.  Comments: N/A  Cristi Gwynn LRT/CTRS         Nannie Starzyk 11/29/2017 4:05 PM

## 2017-11-29 NOTE — Progress Notes (Signed)
UA specimen sent to pharmacy via volunteer.

## 2017-11-29 NOTE — Progress Notes (Signed)
Patient is alert and oriented, denies SI, HI and AVH today. Patient complains of possible UTI. Physcian ordered UA and Keflex. Specimen cup given to patient with instruction. Patient cooperative with medications, spends majority of the day laying in bed; patient will get up to eat meals. Patient is independent of hygiene needs and showers appropriately. Nurse will continue to monitor.

## 2017-11-30 LAB — CBC WITH DIFFERENTIAL/PLATELET
Basophils Absolute: 0 10*3/uL (ref 0–0.1)
Basophils Relative: 1 %
EOS ABS: 0.2 10*3/uL (ref 0–0.7)
EOS PCT: 2 %
HCT: 36.5 % (ref 35.0–47.0)
HEMOGLOBIN: 12.2 g/dL (ref 12.0–16.0)
LYMPHS ABS: 1.8 10*3/uL (ref 1.0–3.6)
Lymphocytes Relative: 25 %
MCH: 30.3 pg (ref 26.0–34.0)
MCHC: 33.4 g/dL (ref 32.0–36.0)
MCV: 90.5 fL (ref 80.0–100.0)
MONOS PCT: 8 %
Monocytes Absolute: 0.6 10*3/uL (ref 0.2–0.9)
NEUTROS PCT: 64 %
Neutro Abs: 4.5 10*3/uL (ref 1.4–6.5)
Platelets: 213 10*3/uL (ref 150–440)
RBC: 4.04 MIL/uL (ref 3.80–5.20)
RDW: 13.7 % (ref 11.5–14.5)
WBC: 7 10*3/uL (ref 3.6–11.0)

## 2017-11-30 NOTE — Progress Notes (Signed)
[facility] MD Progress Note  11/30/2017 1:16 PM Melanie Bell  MRN:  [id number]  Subjective:    Melanie Bell has no complaints. Symptoms of UTI improved some on Keflex. We are still awaiting urine culture. She is not suicidal, homicidal, depressed, psychotic or excessively anxious.   Laft a message for her guardian again. No response. No response from her group home. Our hands are tight.   Principal Problem: Schizoaffective disorder, bipolar type ([place]) Diagnosis:   Patient Active Problem List   Diagnosis Date Noted  . Schizoaffective disorder, bipolar type ([place]) [F25.0] 11/06/2014    Priority: High  . Self-inflicted injury [X91.47] 11/23/2017  . ASCUS of cervix with negative high risk HPV [R87.610] 08/28/2017  . Malingering [Z76.5] 05/06/2016  . Foreign body aspiration [T17.900A]   . Other foreign object in esophagus causing other injury, subsequent encounter [T18.198D]   . Swallowed foreign body [T18.9XXA] 04/23/2016  . Foreign body ingestion [T18.9XXA] 04/21/2016  . Gastric foreign body [T18.2XXA]   . 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 Tobacco use disorder [F17.200] 09/30/2014  . Hypothyroidism [E03.9] 09/29/2014  . Hypertension [I10] 09/29/2014   Total Time spent with patient: 20 minutes  Past Psychiatric History: schizoaffactive disorder  Past Medical History:  Past Medical History:  Diagnosis Date  . Anxiety   . Asthma   . Depression   . GERD (gastroesophageal reflux disease)   . Hallucinations 09/30/2014   Sizoaffective  . Hyperlipidemia   . Hypertension   . Intentional ingestion of batteries 04/21/2016    2 AAA batteries and 1 thumb tack; intent to hurt herself/notes 04/21/2016  . Tardive dyskinesia 10/2014   recent onset    Past Surgical History:  Procedure Laterality Date  . ABDOMINAL SURGERY     "years ago" to remove foreign objects  . APPENDECTOMY    . BREAST EXCISIONAL BIOPSY Right 09/03/2007   surgical bx removed fibroadeoma  . BREAST LUMPECTOMY  Right   . COLONOSCOPY WITH PROPOFOL N/A 09/10/2015   Procedure: COLONOSCOPY WITH PROPOFOL;  Surgeon: [name], MD;  Location: [facility] ENDOSCOPY;  Service: Endoscopy;  Laterality: N/A;  . ESOPHAGOGASTRODUODENOSCOPY N/A 11/28/2014   Procedure: ESOPHAGOGASTRODUODENOSCOPY (EGD);  Surgeon: [name], MD;  Location: [facility] ENDOSCOPY;  Service: Endoscopy;  Laterality: N/A;  . ESOPHAGOGASTRODUODENOSCOPY N/A 02/21/2016   Procedure: ESOPHAGOGASTRODUODENOSCOPY (EGD);  Surgeon: Mauri Pole, MD;  Location[facility] ENDOSCOPY;  Service: Endoscopy;  Laterality: N/A;  . ESOPHAGOGASTRODUODENOSCOPY N/A 04/21/2016   Procedure: ESOPHAGOGASTRODUODENOSCOPY (EGD);  Surgeon: Gatha Mayer, MD;  Location[facility] ENDOSCOPY;  Service: Endoscopy;  Laterality: N/A;  . ESOPHAGOGASTRODUODENOSCOPY N/A 05/06/2016   Procedure: ESOPHAGOGASTRODUODENOSCOPY (EGD);  Surgeon: Jonathon Bellows, MD;  Location: [facility] ENDOSCOPY;  Service: Gastroenterology;  Laterality: N/A;  . ESOPHAGOGASTRODUODENOSCOPY N/A 06/13/2016   Procedure: ESOPHAGOGASTRODUODENOSCOPY (EGD);  Surgeon: [name], MD;  Location: [facility] ENDOSCOPY;  Service: Endoscopy;  Laterality: N/A;  . ESOPHAGOGASTRODUODENOSCOPY (EGD) WITH PROPOFOL N/A 02/29/2016   Procedure: ESOPHAGOGASTRODUODENOSCOPY (EGD) WITH PROPOFOL;  Surgeon: [name], MD;  Location: [facility] ENDOSCOPY;  Service: Endoscopy;  Laterality: N/A;  . LAPAROTOMY N/A 09/12/2015   Procedure: EXPLORATORY LAPAROTOMY;  Surgeon: Florene Glen, MD;  Location: [facility] ORS;  Service: General;  Laterality: N/A;  . SIGMOIDOSCOPY N/A 09/12/2015   Procedure: Lonell Face;  Surgeon: Florene Glen, MD;  Location: [facility] ORS;  Service: General;  Laterality: N/A;  . WISDOM TOOTH EXTRACTION     Family History:  Family History  Problem Relation Age of Onset  . Depression Mother   . Hypertension Mother   .  Sleep apnea Mother   . Asthma Mother   . COPD Mother   . Diabetes Mother   . Breast cancer Maternal Aunt        20's    Family Psychiatric  History: depression Social History:  Social History   Substance and Sexual Activity  Alcohol Use No     Social History   Substance and Sexual Activity  Drug Use No    Social History   Socioeconomic History  . Marital status: Single    Spouse name: Not on file  . Number of children: Not on file  . Years of education: Not on file  . Highest education level: Not on file  Occupational History  . Not on file  Social Needs  . Financial resource strain: Not on file  . Food insecurity:    Worry: Not on file    Inability: Not on file  . Transportation needs:    Medical: Not on file    Non-medical: Not on file  Tobacco Use  . Smoking status: Former Smoker    Packs/day: 0.50    Years: 3.00    Pack years: 1.50    Types: Cigarettes    Last attempt to quit: 08/2016    Years since quitting: 1.2  . Smokeless tobacco: Never Used  Substance and Sexual Activity  . Alcohol use: No  . Drug use: No  . Sexual activity: Never    Birth control/protection: Injection  Lifestyle  . Physical activity:    Days per week: Not on file    Minutes per session: Not on file  . Stress: Not on file  Relationships  . Social connections:    Talks on phone: Not on file    Gets together: Not on file    Attends religious service: Not on file    Active member of club or organization: Not on file    Attends meetings of clubs or organizations: Not on file    Relationship status: Not on file  Other Topics Concern  . Not on file  Social History Narrative  . Not on file   Additional Social History:                         Sleep: Fair  Appetite:  Fair  Current Medications: Current Facility-Administered Medications  Medication Dose Route Frequency Provider Last Rate Last Dose  . acetaminophen (TYLENOL) tablet 650 mg  650 mg Oral Q6H PRN [name], [name], MD   650 mg at 11/24/17 1710  . alum & mag hydroxide-simeth (MAALOX/MYLANTA) 200-200-20 MG/5ML suspension 30  mL  30 mL Oral Q4H PRN [name], [name], MD      . amLODipine (NORVASC) tablet 2.5 mg  2.5 mg Oral Daily [name], [name], MD   2.5 mg at 11/28/17 0846  . cephALEXin (KEFLEX) capsule 500 mg  500 mg Oral Q12H Sheronica Corey B, MD   500 mg at 11/30/17 0855  . cetirizine HCl (Zyrtec) 5 MG/5ML solution 10 mg  10 mg Oral BID PRN [name], [name], MD      . cloZAPine (CLOZARIL) tablet 150 mg  150 mg Oral QHS [name], [name], MD   150 mg at 11/29/17 2114  . hydrOXYzine (ATARAX/VISTARIL) tablet 50 mg  50 mg Oral Q6H PRN [name], [name], MD   50 mg at 11/23/17 2333  . levothyroxine (SYNTHROID, LEVOTHROID) tablet 100 mcg  100 mcg Oral QAC breakfast [name], [name], MD  100 mcg at 11/30/17 0855  . lithium carbonate (LITHOBID) CR tablet 300 mg  300 mg Oral BID WC [name], [name], MD   300 mg at 11/30/17 0855  . magnesium hydroxide (MILK OF MAGNESIA) suspension 30 mL  30 mL Oral Daily PRN [name], [name], MD      . neomycin-bacitracin-polymyxin (NEOSPORIN) ointment   Topical Daily Vern Guerette B, MD      . OXcarbazepine (TRILEPTAL) tablet 150 mg  150 mg Oral BID [name], [name], MD   150 mg at 11/30/17 1229  . pantoprazole (PROTONIX) EC tablet 40 mg  40 mg Oral Daily [name], [name], MD   40 mg at 11/30/17 0855  . phenazopyridine (PYRIDIUM) tablet 100 mg  100 mg Oral TID WC Seher Schlagel B, MD   100 mg at 11/30/17 1229  . polyethylene glycol (MIRALAX / GLYCOLAX) packet 17 g  17 g Oral Daily PRN [name], [name], MD      . senna (SENOKOT) tablet 8.6 mg  1 tablet Oral Daily PRN [name], [name], MD      . traZODone (DESYREL) tablet 50 mg  50 mg Oral QHS [name], [name], MD   50 mg at 11/29/17 2115  . venlafaxine XR (EFFEXOR-XR) 24 hr capsule 150 mg  150 mg Oral Daily [name], [name], MD   150 mg at 11/30/17 0855  . [START ON 12/07/2017] Vitamin D (Ergocalciferol) (DRISDOL) capsule 50,000 Units  50,000 Units Oral Weekly [name], [name], MD        Lab Results:  Results for orders placed or performed  during the hospital encounter of 11/23/17 (from the past 48 hour(s))  Urinalysis, Complete w Microscopic     Status: Abnormal   Collection Time: 11/29/17 10:48 AM  Result Value Ref Range   Color, Urine YELLOW YELLOW   APPearance CLEAR CLEAR   Specific Gravity, Urine 1.015 1.005 - 1.030   pH 8.5 (H) 5.0 - 8.0   Glucose, UA NEGATIVE NEGATIVE mg/dL   Hgb urine dipstick TRACE (A) NEGATIVE   Bilirubin Urine NEGATIVE NEGATIVE   Ketones, ur NEGATIVE NEGATIVE mg/dL   Protein, ur NEGATIVE NEGATIVE mg/dL   Nitrite NEGATIVE NEGATIVE   Leukocytes, UA TRACE (A) NEGATIVE   Squamous Epithelial / LPF 6-10 0 - 5   WBC, UA 0-5 0 - 5 WBC/hpf   RBC / HPF 0-5 0 - 5 RBC/hpf   Bacteria, UA MANY (A) NONE SEEN   Mucus PRESENT     Comment: Performed at [facility], [address] [place], [place] [address]  CBC with Differential/Platelet     Status: None   Collection Time: 11/30/17  6:20 AM  Result Value Ref Range   WBC 7.0 3.6 - 11.0 K/uL   RBC 4.04 3.80 - 5.20 MIL/uL   Hemoglobin 12.2 12.0 - 16.0 g/dL   HCT 36.5 35.0 - 47.0 %   MCV 90.5 80.0 - 100.0 fL   MCH 30.3 26.0 - 34.0 pg   MCHC 33.4 32.0 - 36.0 g/dL   RDW 13.7 11.5 - 14.5 %   Platelets 213 150 - 440 K/uL   Neutrophils Relative % 64 %   Neutro Abs 4.5 1.4 - 6.5 K/uL   Lymphocytes Relative 25 %   Lymphs Abs 1.8 1.0 - 3.6 K/uL   Monocytes Relative 8 %   Monocytes Absolute 0.6 0.2 - 0.9 K/uL   Eosinophils Relative 2 %   Eosinophils Absolute 0.2 0 - 0.7 K/uL  Basophils Relative 1 %   Basophils Absolute 0.0 0 - 0.1 K/uL    Comment: Performed at [facility], [address] [place], [place] [address]    Blood Alcohol level:  Lab Results  Component Value Date   [facility] <10 11/23/2017   ETH <5 60/45/4098    Metabolic Disorder Labs: Lab Results  Component Value Date   HGBA1C 5.1 11/23/2017   MPG 99.67 11/23/2017   MPG 103 03/03/2016   Lab Results  Component Value Date   PROLACTIN 64.6 (H) 03/04/2016   PROLACTIN  15.8 06/16/2015   Lab Results  Component Value Date   CHOL 173 11/23/2017   TRIG 77 11/23/2017   HDL 41 11/23/2017   CHOLHDL 4.2 11/23/2017   VLDL 15 11/23/2017   LDLCALC 117 (H) 11/23/2017   LDLCALC 127 (H) 03/03/2016    Physical Findings: AIMS:  , ,  ,  ,    CIWA:    COWS:     Musculoskeletal: Strength & Muscle Tone: within normal limits Gait & Station: normal Patient leans: N/A  Psychiatric Specialty Exam: Physical Exam  Nursing note and vitals reviewed. Psychiatric: She has a normal mood and affect. Her speech is normal and behavior is normal. Thought content normal. Cognition and memory are normal. She expresses impulsivity.    Review of Systems  Neurological: Negative.   Psychiatric/Behavioral: Negative.   All other systems reviewed and are negative.   Blood pressure (!) 91/53, pulse 66, temperature 98.6 F (37 C), resp. rate 16, height 5\' 6"  (1.676 m), weight 97.1 kg (214 lb), SpO2 100 %.Body mass index is 34.54 kg/m.  General Appearance: Casual  Eye Contact:  Good  Speech:  Clear and Coherent  Volume:  Normal  Mood:  Euthymic  Affect:  Appropriate  Thought Process:  Goal Directed and Descriptions of Associations: Intact  Orientation:  Full (Time, Place, and Person)  Thought Content:  WDL  Suicidal Thoughts:  No  Homicidal Thoughts:  No  Memory:  Immediate;   Fair Recent;   Fair Remote;   Fair  Judgement:  Poor  Insight:  Lacking  Psychomotor Activity:  Normal  Concentration:  Concentration: Fair and Attention Span: Fair  Recall:  [employer] of Knowledge:  Fair  Language:  Fair  Akathisia:  No  Handed:  Right  AIMS (if indicated):     Assets:  Communication Skills Desire for Improvement Financial Resources/Insurance Physical Health Resilience Social Support  ADL's:  Intact  Cognition:  WNL  Sleep:  Number of Hours: 7.5     Treatment Plan Summary: Daily contact with patient to assess and evaluate symptoms and progress in treatment and  Medication management   Melanie Bell is a 27 year old female with a history of schizoaffective disorder and self injurious behavior admitted after suicidal gesture by cutting her arm with a steak knife in the context of losing her place at the group home.No medication changes were offered. At the time of discharge, the patient is no longer suicidal. She is able to contract for safety. She is forward thinking and optimistic about the future.  #Mood/psychosis -continue Clozapine 150 mg nightly -continue Lithium 300 mg BID, Li0.54 -Trileptal 150 mg BID -Effexor 150 mg daily  #HTN -continueLisinopril 2.5 mg daily  #Hypothyroidism -Synthroid 100 ug daily  #GERD -Protonix 40 mg daily  #Constipation -bowel regimen  #Skin cuts -Neosporin  #UTI -Keflex 500 mg BID -Pyridium TID  #Social -incompetent adult -mother the guardian  #Labs -lipid panel, TSH and  [facility]Care normal -EKGreviewed, NSR with QTc 437 -pregnancy test is negative  #Disposition -discharge to her group home -[facility] up [facility]    Orson Slick, MD 11/30/2017, 1:16 PM

## 2017-11-30 NOTE — BHH Group Notes (Signed)
LCSW Group Therapy Note 11/30/2017 9:00 AM  Type of Therapy and Topic:  Group Therapy:  Setting Goals  Participation Level:  Did Not Attend  Description of Group: In this process group, patients discussed using strengths to work toward goals and address challenges.  Patients identified two positive things about themselves and one goal they were working on.  Patients were given the opportunity to share openly and support each other's plan for self-empowerment.  The group discussed the value of gratitude and were encouraged to have a daily reflection of positive characteristics or circumstances.  Patients were encouraged to identify a plan to utilize their strengths to work on current challenges and goals.  Therapeutic Goals 1. Patient will verbalize personal strengths/positive qualities and relate how these can assist with achieving desired personal goals 2. Patients will verbalize affirmation of peers plans for personal change and goal setting 3. Patients will explore the value of gratitude and positive focus as related to successful achievement of goals 4. Patients will verbalize a plan for regular reinforcement of personal positive qualities and circumstances.  Summary of Patient Progress: Melanie Bell was invited to today's group, but chose not to attend.      Therapeutic Modalities Cognitive Behavioral Therapy Motivational Interviewing    [name], Dolores 11/30/2017 11:49 AM

## 2017-11-30 NOTE — Progress Notes (Signed)
No complain of pain or any acute distress noted. Patient denies any SI/HI. Will continue to monitor every 15 minutes.

## 2017-11-30 NOTE — Plan of Care (Signed)
Pt continues to progress towards goals and d/c. RN will continue to monitor.  

## 2017-11-30 NOTE — Progress Notes (Signed)
Patient slept for 7 hours and 45 minutes. No behavior issues noted. Will continue to monitor.

## 2017-11-30 NOTE — BHH Suicide Risk Assessment (Signed)
[place] INPATIENT:  Family/Significant Other Suicide Prevention Education  Suicide Prevention Education:  Contact Attempts: Melanie Bell (mother/legal guardian at [phone]) was contacted on several occasions. Melanie Bell ([facility] at [phone]) who has been identified by the patient as the provider in with whom the patient will be residing, and identified as the person who will aid the patient in the event of a mental health crisis.  Two attempts were made to provide suicide prevention education to [name], prior to and/or following the patient's discharge.  We were unsuccessful in providing suicide prevention education.  A suicide education pamphlet was given to the patient to share with family and grouph[facility] staff.  Date and time of first attempt: 11/24/17/2:06PM Date and time of second attempt: 11/30/17/11:12AM  [name], Marlinda Mike 11/30/2017, 4:04 PM

## 2017-11-30 NOTE — Progress Notes (Signed)
Recreation Therapy Notes  Date: 11/30/2017  Time: 3:00pm  Location: Craft room  Behavioral response: Appropriate  Group Type: Craft  Participation level: Active  Communication: Patient was social with peers and staff.  Comments: N/A  Logen Heintzelman LRT/CTRS        Makayle Krahn 11/30/2017 4:04 PM

## 2017-11-30 NOTE — Progress Notes (Signed)
Recreation Therapy Notes  Date: 11/30/2017  Time: 9:30 am   Location: Craft Room   Behavioral response: N/A   Intervention Topic: Problem Solving  Discussion/Intervention: Patient did not attend group.   Clinical Observations/Feedback:  Patient did not attend group.   Lenzi Marmo LRT/CTRS        Melbourne Jakubiak 11/30/2017 12:37 PM 

## 2017-11-30 NOTE — BHH Group Notes (Signed)
  11/30/2017  Time: 1PM  Type of Therapy/Topic:  Group Therapy:  Balance in Life  Participation Level:  Active  Description of Group:   This group will address the concept of balance and how it feels and looks when one is unbalanced. Patients will be encouraged to process areas in their lives that are out of balance and identify reasons for remaining unbalanced. Facilitators will guide patients in utilizing problem-solving interventions to address and correct the stressor making their life unbalanced. Understanding and applying boundaries will be explored and addressed for obtaining and maintaining a balanced life. Patients will be encouraged to explore ways to assertively make their unbalanced needs known to significant others in their lives, using other group members and facilitator for support and feedback.  Therapeutic Goals: 1. Patient will identify two or more emotions or situations they have that consume much of in their lives. 2. Patient will identify signs/triggers that life has become out of balance:  3. Patient will identify two ways to set boundaries in order to achieve balance in their lives:  4. Patient will demonstrate ability to communicate their needs through discussion and/or role plays  Summary of Patient Progress: Pt continues to work towards their tx goals but has not yet reached them. Pt was able to appropriately participate in group discussion, and was able to offer support/validation to other group members. Pt reported one area of her life she would like to devote more attention to is, "my education." Pt reported one area of her life she would like to devote less attention to is, "my relationships."   Therapeutic Modalities:   Cognitive Behavioral Therapy Solution-Focused Therapy Assertiveness Training  Alden Hipp, MSW, LCSW Clinical Social Worker 11/30/2017 2:18 PM

## 2017-11-30 NOTE — Progress Notes (Signed)
[facility] for clozapine Indication: ANC monitoring  Allergies  Allergen Reactions  . Betadine [Povidone Iodine] Other (See Comments)    Reaction:  Unknown   . Iodine Rash  . Shellfish-Derived Products Other (See Comments)    Reaction:  Unknown     Patient Measurements: Height: 5\' 6"  (167.6 cm) Weight: 214 lb (97.1 kg) IBW/kg (Calculated) : 59.3   Vital Signs:   Intake/Output from previous day: 07/17 0701 - 07/18 0700 In: 720 [P.O.:720] Out: -  Intake/Output from this shift: No intake/output data recorded.  Labs: Recent Labs    11/30/17 0620  WBC 7.0  HGB 12.2  HCT 36.5  PLT 213   Estimated Creatinine Clearance: 123 mL/min (by C-G formula based on SCr of 0.64 mg/dL).   Assessment: 27 yo female on clozapine 150 mg PO qhs currently  7/11 [place] 5500 7/18  ANC 4500  Goal of Therapy:  ANC within goal range  Plan:  >>** NOTE that ZIP CODE is different for [facility] program: [address] not [address] >>>>>> Lab reported to clozapine [facility] registry Pt eligible for dispensing Next labs in one week on 7/25  Pharmacy will continue to follow  Patrick Sohm A 11/30/2017,7:14 PM

## 2017-11-30 NOTE — Progress Notes (Signed)
Affect constricted.  Denies SI/HI/AVH.  Medication and group compliant. Up for meals otherwise isolates to room.  Support and encouragement offered.  Safety rounds maintained.

## 2017-11-30 NOTE — Progress Notes (Signed)
[facility] MD Progress Note  12/01/2017 7:06 PM Melanie Bell  MRN:  [id number]  Subjective:    [name] has ben able to remain ool and collected in the face of multiple disappointments. At this point, she can not be reassured of safe disposition. Her mother, who is the guardian and resides in [place], could not be reached in spite of multiple attempts. Group home owner, who already gave the patient 30-day notice, has been out of town and we do not have any assurances that she even allows the patient to return.   SW contaced APS to report inadequate guardianship and requested appointment of emergency guardian.  Acadia no longer has symptoms of UTI and tolerates Keflex well. Urine cultire is growing gram negative rods, sensitivities to follow.  Principal Problem: Schizoaffective disorder, bipolar type ([place]) Diagnosis:   Patient Active Problem List   Diagnosis Date Noted  . Schizoaffective disorder, bipolar type ([place]) [F25.0] 11/06/2014    Priority: High  . Self-inflicted injury [E33.29] 11/23/2017  . ASCUS of cervix with negative high risk HPV [R87.610] 08/28/2017  . Malingering [Z76.5] 05/06/2016  . Foreign body aspiration [T17.900A]   . Other foreign object in esophagus causing other injury, subsequent encounter [T18.198D]   . Swallowed foreign body [T18.9XXA] 04/23/2016  . Foreign body ingestion [T18.9XXA] 04/21/2016  . Gastric foreign body [T18.2XXA]   . 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 Tobacco use disorder [F17.200] 09/30/2014  . Hypothyroidism [E03.9] 09/29/2014  . Hypertension [I10] 09/29/2014   Total Time spent with patient: 20 minutes  Past Psychiatric History: schizoaffective disorder  Past Medical History:  Past Medical History:  Diagnosis Date  . Anxiety   . Asthma   . Depression   . GERD (gastroesophageal reflux disease)   . Hallucinations 09/30/2014   Sizoaffective  . Hyperlipidemia   . Hypertension   . Intentional ingestion of batteries 04/21/2016     2 AAA batteries and 1 thumb tack; intent to hurt herself/notes 04/21/2016  . Tardive dyskinesia 10/2014   recent onset    Past Surgical History:  Procedure Laterality Date  . ABDOMINAL SURGERY     "years ago" to remove foreign objects  . APPENDECTOMY    . BREAST EXCISIONAL BIOPSY Right 09/03/2007   surgical bx removed fibroadeoma  . BREAST LUMPECTOMY Right   . COLONOSCOPY WITH PROPOFOL N/A 09/10/2015   Procedure: COLONOSCOPY WITH PROPOFOL;  Surgeon: [name], MD;  Location: [facility] ENDOSCOPY;  Service: Endoscopy;  Laterality: N/A;  . ESOPHAGOGASTRODUODENOSCOPY N/A 11/28/2014   Procedure: ESOPHAGOGASTRODUODENOSCOPY (EGD);  Surgeon: [name], MD;  Location: [facility] ENDOSCOPY;  Service: Endoscopy;  Laterality: N/A;  . ESOPHAGOGASTRODUODENOSCOPY N/A 02/21/2016   Procedure: ESOPHAGOGASTRODUODENOSCOPY (EGD);  Surgeon: Mauri Pole, MD;  Location: [facility] ENDOSCOPY;  Service: Endoscopy;  Laterality: N/A;  . ESOPHAGOGASTRODUODENOSCOPY N/A 04/21/2016   Procedure: ESOPHAGOGASTRODUODENOSCOPY (EGD);  Surgeon: Gatha Mayer, MD;  Location: [facility] ENDOSCOPY;  Service: Endoscopy;  Laterality: N/A;  . ESOPHAGOGASTRODUODENOSCOPY N/A 05/06/2016   Procedure: ESOPHAGOGASTRODUODENOSCOPY (EGD);  Surgeon: Jonathon Bellows, MD;  Location: [facility] ENDOSCOPY;  Service: Gastroenterology;  Laterality: N/A;  . ESOPHAGOGASTRODUODENOSCOPY N/A 06/13/2016   Procedure: ESOPHAGOGASTRODUODENOSCOPY (EGD);  Surgeon: [name], MD;  Location: [facility] ENDOSCOPY;  Service: Endoscopy;  Laterality: N/A;  . ESOPHAGOGASTRODUODENOSCOPY (EGD) WITH PROPOFOL N/A 02/29/2016   Procedure: ESOPHAGOGASTRODUODENOSCOPY (EGD) WITH PROPOFOL;  Surgeon: [name], MD;  Location: [facility] ENDOSCOPY;  Service: Endoscopy;  Laterality: N/A;  . LAPAROTOMY N/A 09/12/2015   Procedure: EXPLORATORY LAPAROTOMY;  Surgeon: Florene Glen, MD;  Location:  [facility] ORS;  Service: General;  Laterality: N/A;  . SIGMOIDOSCOPY N/A 09/12/2015   Procedure: SIGMOIDOSCOPY;  Surgeon:  Florene Glen, MD;  Location: [facility] ORS;  Service: General;  Laterality: N/A;  . WISDOM TOOTH EXTRACTION     Family History:  Family History  Problem Relation Age of Onset  . Depression Mother   . Hypertension Mother   . Sleep apnea Mother   . Asthma Mother   . COPD Mother   . Diabetes Mother   . Breast cancer Maternal Aunt        20's   Family Psychiatric  History: depression Social History:  Social History   Substance and Sexual Activity  Alcohol Use No     Social History   Substance and Sexual Activity  Drug Use No    Social History   Socioeconomic History  . Marital status: Single    Spouse name: Not on file  . Number of children: Not on file  . Years of education: Not on file  . Highest education level: Not on file  Occupational History  . Not on file  Social Needs  . Financial resource strain: Not on file  . Food insecurity:    Worry: Not on file    Inability: Not on file  . Transportation needs:    Medical: Not on file    Non-medical: Not on file  Tobacco Use  . Smoking status: Former Smoker    Packs/day: 0.50    Years: 3.00    Pack years: 1.50    Types: Cigarettes    Last attempt to quit: 08/2016    Years since quitting: 1.2  . Smokeless tobacco: Never Used  Substance and Sexual Activity  . Alcohol use: No  . Drug use: No  . Sexual activity: Never    Birth control/protection: Injection  Lifestyle  . Physical activity:    Days per week: Not on file    Minutes per session: Not on file  . Stress: Not on file  Relationships  . Social connections:    Talks on phone: Not on file    Gets together: Not on file    Attends religious service: Not on file    Active member of club or organization: Not on file    Attends meetings of clubs or organizations: Not on file    Relationship status: Not on file  Other Topics Concern  . Not on file  Social History Narrative  . Not on file   Additional Social History:                          Sleep: Fair  Appetite:  Fair  Current Medications: Current Facility-Administered Medications  Medication Dose Route Frequency Provider Last Rate Last Dose  . acetaminophen (TYLENOL) tablet 650 mg  650 mg Oral Q6H PRN [name], [name], MD   650 mg at 11/24/17 1710  . alum & mag hydroxide-simeth (MAALOX/MYLANTA) 200-200-20 MG/5ML suspension 30 mL  30 mL Oral Q4H PRN [name], [name], MD      . amLODipine (NORVASC) tablet 2.5 mg  2.5 mg Oral Daily [name], [name], MD   2.5 mg at 12/01/17 0824  . cephALEXin (KEFLEX) capsule 500 mg  500 mg Oral Q12H Chakara Bognar B, MD   500 mg at 12/01/17 0824  . cetirizine HCl (Zyrtec) 5 MG/5ML solution 10 mg  10 mg Oral BID PRN [name], [name], MD      .  cloZAPine (CLOZARIL) tablet 150 mg  150 mg Oral QHS [name], [name], MD   150 mg at 11/30/17 2129  . hydrOXYzine (ATARAX/VISTARIL) tablet 50 mg  50 mg Oral Q6H PRN [name], [name], MD   50 mg at 11/23/17 2333  . levothyroxine (SYNTHROID, LEVOTHROID) tablet 100 mcg  100 mcg Oral QAC breakfast [name], [name], MD   100 mcg at 12/01/17 0823  . lithium carbonate (LITHOBID) CR tablet 300 mg  300 mg Oral BID WC [name], [name], MD   300 mg at 12/01/17 1746  . magnesium hydroxide (MILK OF MAGNESIA) suspension 30 mL  30 mL Oral Daily PRN [name], [name], MD      . neomycin-bacitracin-polymyxin (NEOSPORIN) ointment   Topical Daily Zidane Renner B, MD      . OXcarbazepine (TRILEPTAL) tablet 150 mg  150 mg Oral BID [name], [name], MD   150 mg at 12/01/17 1746  . pantoprazole (PROTONIX) EC tablet 40 mg  40 mg Oral Daily [name], [name], MD   40 mg at 12/01/17 0824  . phenazopyridine (PYRIDIUM) tablet 100 mg  100 mg Oral TID WC Lorris Carducci B, MD   100 mg at 12/01/17 1746  . polyethylene glycol (MIRALAX / GLYCOLAX) packet 17 g  17 g Oral Daily PRN [name], [name], MD   17 g at 11/30/17 2130  . senna (SENOKOT) tablet 8.6 mg  1 tablet Oral Daily PRN [name], [name], MD      . traZODone (DESYREL) tablet 50  mg  50 mg Oral QHS [name], [name], MD   50 mg at 11/30/17 2129  . venlafaxine XR (EFFEXOR-XR) 24 hr capsule 150 mg  150 mg Oral Daily [name], [name], MD   150 mg at 12/01/17 0823  . [START ON 12/07/2017] Vitamin D (Ergocalciferol) (DRISDOL) capsule 50,000 Units  50,000 Units Oral Weekly [name], [name], MD        Lab Results:  Results for orders placed or performed during the hospital encounter of 11/23/17 (from the past 48 hour(s))  CBC with Differential/Platelet     Status: None   Collection Time: 11/30/17  6:20 AM  Result Value Ref Range   WBC 7.0 3.6 - 11.0 K/uL   RBC 4.04 3.80 - 5.20 MIL/uL   Hemoglobin 12.2 12.0 - 16.0 g/dL   HCT 36.5 35.0 - 47.0 %   MCV 90.5 80.0 - 100.0 fL   MCH 30.3 26.0 - 34.0 pg   MCHC 33.4 32.0 - 36.0 g/dL   RDW 13.7 11.5 - 14.5 %   Platelets 213 150 - 440 K/uL   Neutrophils Relative % 64 %   Neutro Abs 4.5 1.4 - 6.5 K/uL   Lymphocytes Relative 25 %   Lymphs Abs 1.8 1.0 - 3.6 K/uL   Monocytes Relative 8 %   Monocytes Absolute 0.6 0.2 - 0.9 K/uL   Eosinophils Relative 2 %   Eosinophils Absolute 0.2 0 - 0.7 K/uL   Basophils Relative 1 %   Basophils Absolute 0.0 0 - 0.1 K/uL    Comment: Performed at [facility] [address] [place], [place] [address]    Blood Alcohol level:  Lab Results  Component Value Date   [facility] <10 11/23/2017   ETH <5 60/45/4098    Metabolic Disorder Labs: Lab Results  Component Value Date   HGBA1C 5.1 11/23/2017   MPG 99.67 11/23/2017   MPG 103 03/03/2016   Lab Results  Component Value Date  PROLACTIN 64.6 (H) 03/04/2016   PROLACTIN 15.8 06/16/2015   Lab Results  Component Value Date   CHOL 173 11/23/2017   TRIG 77 11/23/2017   HDL 41 11/23/2017   CHOLHDL 4.2 11/23/2017   VLDL 15 11/23/2017   LDLCALC 117 (H) 11/23/2017   LDLCALC 127 (H) 03/03/2016    Physical Findings: AIMS: Facial and Oral Movements Muscles of Facial Expression: None, normal Lips and Perioral Area: None, normal Jaw:  None, normal Tongue: None, normal,Extremity Movements Upper (arms, wrists, hands, fingers): None, normal Lower (legs, knees, ankles, toes): None, normal, Trunk Movements Neck, shoulders, hips: None, normal, Overall Severity Severity of abnormal movements (highest score from questions above): None, normal Incapacitation due to abnormal movements: None, normal Patient's awareness of abnormal movements (rate only patient's report): No Awareness, Dental Status Current problems with teeth and/or dentures?: No Does patient usually wear dentures?: No  CIWA:    COWS:     Musculoskeletal: Strength & Muscle Tone: within normal limits Gait & Station: normal Patient leans: N/A  Psychiatric Specialty Exam: Physical Exam  Nursing note and vitals reviewed. Psychiatric: She has a normal mood and affect. Her speech is normal and behavior is normal. Judgment and thought content normal. Cognition and memory are normal.    Review of Systems  Cardiovascular: Positive for orthopnea.  Neurological: Negative.   Psychiatric/Behavioral: Negative.   All other systems reviewed and are negative.   Blood pressure (!) 91/53, pulse 66, temperature 98.6 F (37 C), resp. rate 16, height 5\' 6"  (1.676 m), weight 97.1 kg (214 lb), SpO2 100 %.Body mass index is 34.54 kg/m.  General Appearance: Casual  Eye Contact:  Good  Speech:  Clear and Coherent  Volume:  Normal  Mood:  Euthymic  Affect:  Appropriate  Thought Process:  Goal Directed and Descriptions of Associations: Intact  Orientation:  Full (Time, Place, and Person)  Thought Content:  WDL  Suicidal Thoughts:  No  Homicidal Thoughts:  No  Memory:  Immediate;   Fair Recent;   Fair Remote;   Fair  Judgement:  Poor  Insight:  Shallow  Psychomotor Activity:  Normal  Concentration:  Concentration: Fair and Attention Span: Fair  Recall:  [employer] of Knowledge:  Fair  Language:  Fair  Akathisia:  No  Handed:  Right  AIMS (if indicated):     Assets:   Communication Skills Desire for Improvement Financial Resources/Insurance Physical Health Resilience  ADL's:  Intact  Cognition:  WNL  Sleep:  Number of Hours: 6     Treatment Plan Summary: Daily contact with patient to assess and evaluate symptoms and progress in treatment and Medication management   [name] is a 27 year old female with a history of schizoaffective disorder and self injurious behavior admitted after suicidal gesture by cutting her arm with a steak knife in the context of losing her place at the group home.No medication changes were offered. At the time of discharge, the patient is no longer suicidal. She is able to contract for safety. She is forward thinking and optimistic about the future.  Unable to discharge for lack of contact with the guardian.  #Mood/psychosis -continue Clozapine 150 mg nightly -continue Lithium 300 mg BID, Li0.54 -Trileptal 150 mg BID -Effexor 150 mg daily  #HTN -continueLisinopril 2.5 mg daily  #Hypothyroidism -Synthroid 100 ug daily  #GERD -Protonix 40 mg daily  #Constipation -bowel regimen  #Skin cuts, healed  #UTI -Keflex 500 mg BID -Pyridium TID -urine cx pending  #Social -incompetent adult -unable  to reach her mother, the guardian  #Labs -lipid panel, TSH and A1Care normal -EKGreviewed, NSR with QTc 437 -pregnancy test is negative  #Disposition -TBE -uncertain if she is allowed to return to her group home as 30 day notice was issued -follow up with[facility] ACT team    Orson Slick, MD 12/01/2017, 7:06 PM

## 2017-11-30 NOTE — Progress Notes (Signed)
Patient ID: Melanie Bell, female   DOB: July 02, 1990, 27 y.o.   MRN: [id number] CSW attempted to contact pt's mother/legal guardian, [name] ([phone]) again, but had to leave a voice message.  CSW also tried to contact Brigid Re with [facility] [phone]), but was unable to get anyone on the phone.  CSW followed up with Eligha Bridegroom, Team Leader with [facility] ACTT to see if she had any luck with getting in touch with pt's legal guardian or anyone at the [facility]. Ms Jimmye Norman shared that she feels that the [facility] is avoiding [name] as she has tried calling several times.  Ms. Jimmye Norman shared that she does not think that the [facility] wants pt to come back, but the pt needs to be able to obtain her possessions.    Ms. Jimmye Norman shared that she has went to both of the GHs, but no one was at the [facility] (or not allowing her in).  Ms. Jimmye Norman shared that it is very difficult getting in touch with pt's legal guardian/mother and that the ACTT usually has to stay trying to contact her a month in advance in order to go over pt's tx plan and to get documents signed.  Ms. Jimmye Norman shared that she has discussed with the guardian her getting pt another guardian that is closer and has more time and dedicate to the pt's needs.  Ms. Jimmye Norman informed CSW that she was planning to go back by the [facility] this afternoon and would f/u with CSW if she obtains more information.  Ms. Jimmye Norman shared that finding pt a new [place] is the teams priority.  CSW informed Ms. Jimmye Norman that she could assist, but would have to be kept in the loop of what places are being contacted as well as any places pt has resided in the past.

## 2017-12-01 NOTE — Progress Notes (Signed)
Pleasant and cooperative.  Denies SI/HI/AVH.  Medication and group compliant.  Interacting appropriately with peers and staff.  Support offered.  Safety maintained.

## 2017-12-01 NOTE — BHH Group Notes (Signed)
[place] Group Notes:  (Nursing/MHT/Case Management/Adjunct)  Date:  12/01/2017  Time:  3:11 PM  Type of Therapy:  Psychoeducational Skills  Participation Level:  Active  Participation Quality:  Appropriate, Attentive and Sharing  Affect:  Appropriate  Cognitive:  Alert and Appropriate  Insight:  Appropriate  Engagement in Group:  Engaged  Modes of Intervention:  Activity and Education  Summary of Progress/Problems:  Melanie Bell Debralee Braaksma 12/01/2017, 3:11 PM

## 2017-12-01 NOTE — Progress Notes (Signed)
Nursing note 7p-7a  Pt observed interacting with peers on unit this shift. Displayed a bright  affect and mood upon interaction with this Probation officer. Pt complains of dull achy pains in her lower abd and stated that the antibiotic is helping. Pt refused prn medication for pain at this time. PT denies SI/HI, and also denies any audio or visual hallucinations. Pt complains of constipation , Miralax/ Glycolax 17g given as ordered by MD please see MAR for prn medication administration. Pt is able to verbally contract for safety with this RN. Pt is now resting in bed with eyes closed, with no signs or symptoms of pain or distress noted. Pt continues to remain safe on the unit and is observed by rounding every 15 min. RN will continue to monitor.

## 2017-12-01 NOTE — Progress Notes (Signed)
Recreation Therapy Notes   Date: 12/01/2017  Time: 9:30 am   Location: Craft Room   Behavioral response: N/A   Intervention Topic: Leisure  Discussion/Intervention: Patient did not attend group.   Clinical Observations/Feedback:  Patient did not attend group.   Kamorah Nevils LRT/CTRS         Kersten Salmons 12/01/2017 12:09 PM

## 2017-12-01 NOTE — Progress Notes (Signed)
Patient ID: Melanie Bell, female   DOB: 16-Jan-1991, 27 y.o.   MRN: [id number] CSW attempted to contact pt's mother/legal guardian, [name] again at [phone] (contact number in pt's medical record) via telephone as well as text message.  CSW received a text message back that stated that this was not Ms. Brooks contact number.    CSW was informed by Sherrian Divers, Peer Support Specialist with [facility], that he went by the [facility] to see if he could talk with the owner or [facility] staff.  No one from the [facility] or pt's ACTT has been able to get in touch with the [facility] owner, Brigid Re, to discuss pt coming back to the [facility].  Mr. Marshell Levan shared that he spoke with the owner's brother who is on staff at the [facility].  He was informed that the Ms Glennon Mac was on vacation and would be returning on Monday, December 04, 2017.  Mr.  Marshell Levan was informed that Ms. Glennon Mac would be making a decision whether or not she would be allowing pt to return to the [facility] on this date.  Mr. Marshell Levan gave CSW another telephone number for Ms. Brooks that the [facility] had in its files.  The number is [phone].  CSW attempted to contact Ms. Brooks at this number, but was unable to reach her or leave a VM.  CSW followed up with Eligha Bridegroom, ACTT Leader with [facility] to see if she had any updates.  Ms. Jimmye Norman shared that she would be contacting the few GHs that she is aware of that have women homes.  She shared that she has not been able to get in touch with Ms. Rolena Infante or Ms. Glennon Mac.  She shared that she also spoke with the Ms. Marisue Brooklyn brother who informed her that she would have to speak with Ms. Glennon Mac about the pt.  CSW informed Ms. Williams that pt's attending physician, Dr. Bary Leriche, has suggested that an APS report be made due to the fact that the guardian has not been able to be contacted by anyone and there were decisions that need to be made for the pt's care.  Ms. Jimmye Norman shared that she was open to filing a joint report with  [place] [facility]PS.  CSW contacted [place] [facility]PS and gave a report to Burlene Arnt with the Intake Unit for neglect/inadequate guardianship.  CSW asked Mr. Domingo Cocking to add to the report that there is a request for emergency guardianship as the pt is ready to be discharged from the hospital.  Mr. Domingo Cocking informed CSW that he would add this information to the report and that an APS supervisor would be contacting CSW to discuss information gathered on the report.

## 2017-12-01 NOTE — Progress Notes (Signed)
Patient ID: Melanie Bell, female   DOB: 1990-10-23, 27 y.o.   MRN: [id number] PER STATE REGULATIONS 482.30  THIS CHART WAS REVIEWED FOR MEDICAL NECESSITY WITH RESPECT TO THE PATIENT'S ADMISSION/ DURATION OF STAY.  NEXT REVIEW DATE: 12/05/2017  Chauncy Lean, RN, BSN CASE MANAGER

## 2017-12-01 NOTE — BHH Group Notes (Signed)
12/01/2017 1PM  Type of Therapy and Topic:  Group Therapy:  Feelings around Relapse and Recovery  Participation Level:  Active   Description of Group:    Patients in this group will discuss emotions they experience before and after a relapse. They will process how experiencing these feelings, or avoidance of experiencing them, relates to having a relapse. Facilitator will guide patients to explore emotions they have related to recovery. Patients will be encouraged to process which emotions are more powerful. They will be guided to discuss the emotional reaction significant others in their lives may have to patients' relapse or recovery. Patients will be assisted in exploring ways to respond to the emotions of others without this contributing to a relapse.  Therapeutic Goals: 1. Patient will identify two or more emotions that lead to a relapse for them 2. Patient will identify two emotions that result when they relapse 3. Patient will identify two emotions related to recovery 4. Patient will demonstrate ability to communicate their needs through discussion and/or role plays   Summary of Patient Progress: Actively and appropriately engaged in the group. Patient was able to provide support and validation to other group members.Patient practiced active listening when interacting with the facilitator and other group members. Pt. Reports a coping skill that she can use while working towards recovery is "reading and walking". Patient is still in the process of obtaining treatment goals.      Therapeutic Modalities:   Cognitive Behavioral Therapy Solution-Focused Therapy Assertiveness Training Relapse Prevention Therapy   Darin Engels, [place] 12/01/2017 2:35 PM

## 2017-12-02 LAB — URINE CULTURE

## 2017-12-02 NOTE — BHH Group Notes (Signed)
LCSW Group Therapy Note  12/02/2017 1:15pm  Type of Therapy and Topic: Group Therapy: Holding on to Grudges   Participation Level: Active   Description of Group:  In this group patients will be asked to explore and define a grudge. Patients will be guided to discuss their thoughts, feelings, and reasons as to why people have grudges. Patients will process the impact grudges have on daily life and identify thoughts and feelings related to holding grudges. Facilitator will challenge patients to identify ways to let go of grudges and the benefits this provides. Patients will be confronted to address why one struggles letting go of grudges. Lastly, patients will identify feelings and thoughts related to what life would look like without grudges. This group will be process-oriented, with patients participating in exploration of their own experiences, giving and receiving support, and processing challenge from other group members.  Therapeutic Goals:  1. Patient will identify specific grudges related to their personal life.  2. Patient will identify feelings, thoughts, and beliefs around grudges.  3. Patient will identify how one releases grudges appropriately.  4. Patient will identify situations where they could have let go of the grudge, but instead chose to hold on.   Summary of Patient Progress: The patient reported she feels "good". Patients were guided to discuss their thoughts, feelings, and reasons as to why people have grudges. The patient was able to process the impact grudges have on daily life and identified thoughts and feelings related to holding grudges. The patient was challenged to identify ways to let go of grudges and the benefits this provides. Pt. actively and appropriately engaged in the group. Patient was able to provide support and validation to other group members.    Therapeutic Modalities:  Cognitive Behavioral Therapy  Solution Focused Therapy  Motivational  Interviewing  Brief Therapy   Inesha Sow  CUEBAS-COLON, LCSW 12/02/2017 10:13 AM

## 2017-12-02 NOTE — Plan of Care (Signed)
Denies SI/HI/AVH.  Rates depression as 2/10 and anxiety as 5/10.  Visible in the milieu.  Interacting with peers and staff appropriately.  Support offered.  Safety rounds maintained.   Problem: Activity: Goal: Interest or engagement in leisure activities will improve Outcome: Progressing Goal: Imbalance in normal sleep/wake cycle will improve Outcome: Progressing   Problem: Coping: Goal: Coping ability will improve Outcome: Progressing Goal: Will verbalize feelings Outcome: Progressing   Problem: Health Behavior/Discharge Planning: Goal: Ability to make decisions will improve Outcome: Progressing Goal: Compliance with therapeutic regimen will improve Outcome: Progressing   Problem: Safety: Goal: Ability to disclose and discuss suicidal ideas will improve Outcome: Progressing Goal: Ability to identify and utilize support systems that promote safety will improve Outcome: Progressing   Problem: Self-Concept: Goal: Will verbalize positive feelings about self Outcome: Progressing Goal: Level of anxiety will decrease Outcome: Progressing

## 2017-12-02 NOTE — Progress Notes (Signed)
[facility] MD Progress Note  12/02/2017 2:51 PM Melanie Bell  MRN:  [id number] Subjective: Patient seen chart reviewed.  Patient known from previous encounters.  Woman with schizoaffective disorder in the hospital after stabbing herself in the leg.  Mood is feeling better today.  Denies suicidal thoughts.  Denies hallucinations.  Has not done anything else to hurt her self.  She is discouraged however about the lack of discharge planning. Principal Problem: Schizoaffective disorder, bipolar type ([place]) Diagnosis:   Patient Active Problem List   Diagnosis Date Noted  . Self-inflicted injury [T24.58] 11/23/2017  . ASCUS of cervix with negative high risk HPV [R87.610] 08/28/2017  . Malingering [Z76.5] 05/06/2016  . Foreign body aspiration [T17.900A]   . Other foreign object in esophagus causing other injury, subsequent encounter [T18.198D]   . Swallowed foreign body [T18.9XXA] 04/23/2016  . Foreign body ingestion [T18.9XXA] 04/21/2016  . Gastric foreign body [T18.2XXA]   . 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 Schizoaffective disorder, bipolar type ([place]) [F25.0] 11/06/2014  . Tobacco use disorder [F17.200] 09/30/2014  . Hypothyroidism [E03.9] 09/29/2014  . Hypertension [I10] 09/29/2014   Total Time spent with patient: 20 minutes  Past Psychiatric History: Patient has a long history of behavior problems resulting in difficulty with placement and multiple hospitalizations  Past Medical History:  Past Medical History:  Diagnosis Date  . Anxiety   . Asthma   . Depression   . GERD (gastroesophageal reflux disease)   . Hallucinations 09/30/2014   Sizoaffective  . Hyperlipidemia   . Hypertension   . Intentional ingestion of batteries 04/21/2016    2 AAA batteries and 1 thumb tack; intent to hurt herself/notes 04/21/2016  . Tardive dyskinesia 10/2014   recent onset    Past Surgical History:  Procedure Laterality Date  . ABDOMINAL SURGERY     "years ago" to remove foreign objects  .  APPENDECTOMY    . BREAST EXCISIONAL BIOPSY Right 09/03/2007   surgical bx removed fibroadeoma  . BREAST LUMPECTOMY Right   . COLONOSCOPY WITH PROPOFOL N/A 09/10/2015   Procedure: COLONOSCOPY WITH PROPOFOL;  Surgeon: [name], MD;  Location: [facility] END[facility]COPY;  Service: Endoscopy;  Laterality: N/A;  . ESOPHAGOGASTRODUODENOSCOPY N/A 11/28/2014   Procedure: ESOPHAGOGASTRODUODENOSCOPY (EGD);  Surgeon: [name], MD;  Location: [facility] ENDOSCOPY;  Service: Endoscopy;  Laterality: N/A;  . ESOPHAGOGASTRODUODENOSCOPY N/A 02/21/2016   Procedure: ESOPHAGOGASTRODUODENOSCOPY (EGD);  Surgeon: Mauri Pole, MD;  Location: [facility] ENDOSCOPY;  Service: Endoscopy;  Laterality: N/A;  . ESOPHAGOGASTRODUODENOSCOPY N/A 04/21/2016   Procedure: ESOPHAGOGASTRODUODENOSCOPY (EGD);  Surgeon: Gatha Mayer, MD;  Location: [facility] ENDOSCOPY;  Service: Endoscopy;  Laterality: N/A;  . ESOPHAGOGASTRODUODENOSCOPY N/A 05/06/2016   Procedure: ESOPHAGOGASTRODUODENOSCOPY (EGD);  Surgeon: Jonathon Bellows, MD;  Location: [facility] ENDOSCOPY;  Service: Gastroenterology;  Laterality: N/A;  . ESOPHAGOGASTRODUODENOSCOPY N/A 06/13/2016   Procedure: ESOPHAGOGASTRODUODENOSCOPY (EGD);  Surgeon: [name], MD;  Location: [facility] ENDOSCOPY;  Service: Endoscopy;  Laterality: N/A;  . ESOPHAGOGASTRODUODENOSCOPY (EGD) WITH PROPOFOL N/A 02/29/2016   Procedure: ESOPHAGOGASTRODUODENOSCOPY (EGD) WITH PROPOFOL;  Surgeon: [name], MD;  Location: [facility] ENDOSCOPY;  Service: Endoscopy;  Laterality: N/A;  . LAPAROTOMY N/A 09/12/2015   Procedure: EXPLORATORY LAPAROTOMY;  Surgeon: Florene Glen, MD;  Location: [facility] ORS;  Service: General;  Laterality: N/A;  . SIGMOIDOSCOPY N/A 09/12/2015   Procedure: Lonell Face;  Surgeon: Florene Glen, MD;  Location: [facility] ORS;  Service: General;  Laterality: N/A;  . WISDOM TOOTH EXTRACTION     Family History:  Family History  Problem Relation Age  of Onset  . Depression Mother   . Hypertension Mother   . Sleep apnea  Mother   . Asthma Mother   . COPD Mother   . Diabetes Mother   . Breast cancer Maternal Aunt        20's   Family Psychiatric  History: See previous note Social History:  Social History   Substance and Sexual Activity  Alcohol Use No     Social History   Substance and Sexual Activity  Drug Use No    Social History   Socioeconomic History  . Marital status: Single    Spouse name: Not on file  . Number of children: Not on file  . Years of education: Not on file  . Highest education level: Not on file  Occupational History  . Not on file  Social Needs  . Financial resource strain: Not on file  . Food insecurity:    Worry: Not on file    Inability: Not on file  . Transportation needs:    Medical: Not on file    Non-medical: Not on file  Tobacco Use  . Smoking status: Former Smoker    Packs/day: 0.50    Years: 3.00    Pack years: 1.50    Types: Cigarettes    Last attempt to quit: 08/2016    Years since quitting: 1.3  . Smokeless tobacco: Never Used  Substance and Sexual Activity  . Alcohol use: No  . Drug use: No  . Sexual activity: Never    Birth control/protection: Injection  Lifestyle  . Physical activity:    Days per week: Not on file    Minutes per session: Not on file  . Stress: Not on file  Relationships  . Social connections:    Talks on phone: Not on file    Gets together: Not on file    Attends religious service: Not on file    Active member of club or organization: Not on file    Attends meetings of clubs or organizations: Not on file    Relationship status: Not on file  Other Topics Concern  . Not on file  Social History Narrative  . Not on file   Additional Social History:                         Sleep: Fair  Appetite:  Fair  Current Medications: Current Facility-Administered Medications  Medication Dose Route Frequency Provider Last Rate Last Dose  . acetaminophen (TYLENOL) tablet 650 mg  650 mg Oral Q6H PRN Maia Handa,  [name], MD   650 mg at 11/24/17 1710  . alum & mag hydroxide-simeth (MAALOX/MYLANTA) 200-200-20 MG/5ML suspension 30 mL  30 mL Oral Q4H PRN Linnea Todisco T, MD      . amLODipine (NORVASC) tablet 2.5 mg  2.5 mg Oral Daily Lanae Federer T, MD   2.5 mg at 12/02/17 0853  . cephALEXin (KEFLEX) capsule 500 mg  500 mg Oral Q12H [name], [name], MD   500 mg at 12/02/17 0852  . cetirizine HCl (Zyrtec) 5 MG/5ML solution 10 mg  10 mg Oral BID PRN Shaquita Fort T, MD      . cloZAPine (CLOZARIL) tablet 150 mg  150 mg Oral QHS [facility] T, MD   150 mg at 12/01/17 2046  . hydrOXYzine (ATARAX/VISTARIL) tablet 50 mg  50 mg Oral Q6H PRN Emberlin Verner, [name], MD   50 mg at 11/23/17 2333  . levothyroxine (  SYNTHROID, LEVOTHROID) tablet 100 mcg  100 mcg Oral QAC breakfast Leda Bellefeuille, [name], MD   100 mcg at 12/02/17 0853  . lithium carbonate (LITHOBID) CR tablet 300 mg  300 mg Oral BID WC Joselinne Lawal, [name], MD   300 mg at 12/02/17 0852  . magnesium hydroxide (MILK OF MAGNESIA) suspension 30 mL  30 mL Oral Daily PRN Elle Vezina T, MD      . neomycin-bacitracin-polymyxin (NEOSPORIN) ointment   Topical Daily [name], [name], MD      . OXcarbazepine (TRILEPTAL) tablet 150 mg  150 mg Oral BID Charlette Hennings, [name], MD   150 mg at 12/02/17 1202  . pantoprazole (PROTONIX) EC tablet 40 mg  40 mg Oral Daily Sabin Gibeault, [name], MD   40 mg at 12/02/17 0853  . phenazopyridine (PYRIDIUM) tablet 100 mg  100 mg Oral TID WC [name], [name], MD   100 mg at 12/02/17 1202  . polyethylene glycol (MIRALAX / GLYCOLAX) packet 17 g  17 g Oral Daily PRN Emeka Lindner, [name], MD   17 g at 11/30/17 2130  . senna (SENOKOT) tablet 8.6 mg  1 tablet Oral Daily PRN Ludwin Flahive T, MD      . traZODone (DESYREL) tablet 50 mg  50 mg Oral QHS Sonnie Pawloski, [name], MD   50 mg at 12/01/17 2046  . venlafaxine XR (EFFEXOR-XR) 24 hr capsule 150 mg  150 mg Oral Daily Eluterio Seymour, [name], MD   150 mg at 12/02/17 0853  . [START ON 12/07/2017] Vitamin D (Ergocalciferol)  (DRISDOL) capsule 50,000 Units  50,000 Units Oral Weekly Smt. Loder T, MD        Lab Results: No results found for this or any previous visit (from the past 48 hour(s)).  Blood Alcohol level:  Lab Results  Component Value Date   ETH <10 11/23/2017   ETH <5 62/37/6283    Metabolic Disorder Labs: Lab Results  Component Value Date   HGBA1C 5.1 11/23/2017   MPG 99.67 11/23/2017   MPG 103 03/03/2016   Lab Results  Component Value Date   PROLACTIN 64.6 (H) 03/04/2016   PROLACTIN 15.8 06/16/2015   Lab Results  Component Value Date   CHOL 173 11/23/2017   TRIG 77 11/23/2017   HDL 41 11/23/2017   CHOLHDL 4.2 11/23/2017   VLDL 15 11/23/2017   LDLCALC 117 (H) 11/23/2017   LDLCALC 127 (H) 03/03/2016    Physical Findings: AIMS: Facial and Oral Movements Muscles of Facial Expression: None, normal Lips and Perioral Area: None, normal Jaw: None, normal Tongue: None, normal,Extremity Movements Upper (arms, wrists, hands, fingers): None, normal Lower (legs, knees, ankles, toes): None, normal, Trunk Movements Neck, shoulders, hips: None, normal, Overall Severity Severity of abnormal movements (highest score from questions above): None, normal Incapacitation due to abnormal movements: None, normal Patient's awareness of abnormal movements (rate only patient's report): No Awareness, Dental Status Current problems with teeth and/or dentures?: No Does patient usually wear dentures?: No  CIWA:    COWS:     Musculoskeletal: Strength & Muscle Tone: within normal limits Gait & Station: normal Patient leans: N/A  Psychiatric Specialty Exam: Physical Exam  Nursing note and vitals reviewed. Constitutional: She appears well-developed and well-nourished.  HENT:  Head: Normocephalic and atraumatic.  Eyes: Pupils are equal, round, and reactive to light. Conjunctivae are normal.  Neck: Normal range of motion.  Cardiovascular: Regular rhythm and normal heart sounds.  Respiratory:  Effort normal. No respiratory distress.  GI: Soft.  Musculoskeletal:  Normal range of motion.  Neurological: She is alert.  Skin: Skin is warm and dry.  Psychiatric: She has a normal mood and affect. Judgment normal. Her speech is delayed. She is slowed. Thought content is not paranoid. Cognition and memory are impaired. She expresses no homicidal and no suicidal ideation.    Review of Systems  Constitutional: Negative.   HENT: Negative.   Eyes: Negative.   Respiratory: Negative.   Cardiovascular: Negative.   Gastrointestinal: Negative.   Musculoskeletal: Negative.   Skin: Negative.   Neurological: Negative.   Psychiatric/Behavioral: Negative.     Blood pressure 101/60, pulse (!) 103, temperature 98.4 F (36.9 C), temperature source Oral, resp. rate 18, height 5\' 6"  (1.676 m), weight 97.1 kg (214 lb), SpO2 99 %.Body mass index is 34.54 kg/m.  General Appearance: Disheveled  Eye Contact:  Fair  Speech:  Slow  Volume:  Decreased  Mood:  Dysphoric  Affect:  Constricted  Thought Process:  Goal Directed  Orientation:  Full (Time, Place, and Person)  Thought Content:  Logical  Suicidal Thoughts:  No  Homicidal Thoughts:  No  Memory:  Immediate;   Fair Recent;   Fair Remote;   Fair  Judgement:  Fair  Insight:  Fair  Psychomotor Activity:  Decreased  Concentration:  Concentration: Fair  Recall:  [employer] of Knowledge:  Fair  Language:  Fair  Akathisia:  No  Handed:  Right  AIMS (if indicated):     Assets:  Desire for Improvement Social Support  ADL's:  Intact  Cognition:  Impaired,  Mild  Sleep:  Number of Hours: 7.45     Treatment Plan Summary: Medication management and Plan Patient's mood overall seems to be better.  At her baseline she is still pretty flat in her affect.  Denies any suicidal thought or intent to harm herself.  Cooperative with treatment.  No new physical complaints.  The injuries to her legs have healed up pretty well.  As noted previously there is  trouble locating appropriate disposition in part because of trouble getting in touch with her mother.  No change to medication.  Supportive counseling.  Alethia Berthold, MD 12/02/2017, 2:51 PM

## 2017-12-02 NOTE — Plan of Care (Signed)
Patient is aware of coping skills , and able to voice and identify positive attributes of self, improving in all areas, socializing and taking her meds, no noticeable side effects noted, denies any SI/HI/AVH at this time. 15 minute rounding is in progress.no distress. Problem: Activity: Goal: Interest or engagement in leisure activities will improve Outcome: Progressing Goal: Imbalance in normal sleep/wake cycle will improve Outcome: Progressing   Problem: Coping: Goal: Coping ability will improve Outcome: Progressing Goal: Will verbalize feelings Outcome: Progressing   Problem: Health Behavior/Discharge Planning: Goal: Ability to make decisions will improve Outcome: Progressing Goal: Compliance with therapeutic regimen will improve Outcome: Progressing   Problem: Safety: Goal: Ability to disclose and discuss suicidal ideas will improve Outcome: Progressing Goal: Ability to identify and utilize support systems that promote safety will improve Outcome: Progressing   Problem: Self-Concept: Goal: Will verbalize positive feelings about self Outcome: Progressing Goal: Level of anxiety will decrease Outcome: Progressing

## 2017-12-03 NOTE — Progress Notes (Signed)
DAR NOTE: Patient present with bright affect and pleasant mood. Pt has been out of the room , been in the day doing activities with peers. Reports good sleep, good appetite, low energy level, and good concentration. Denies pain, auditory and visual hallucinations.  Rates depression at 1, hopelessness at 1, and anxiety at 2.  Maintained on routine safety checks.  Medications given as prescribed.  Support and encouragement offered as needed.  States goal for today is " try not to sleep all day.".  Will continue to monitor.

## 2017-12-03 NOTE — [facility] Group Notes (Signed)
[place] Group Notes:  (Nursing/MHT/Case Management/Adjunct)  Date:  12/03/2017  Time:  10:20 PM  Type of Therapy:  Group Therapy  Participation Level:  Active  Participation Quality:  Appropriate  Affect:  Appropriate  Cognitive:  Appropriate  Insight:  Appropriate  Engagement in Group:  Engaged  Modes of Intervention:  Support  Summary of Progress/Problems:  Karolynn Infantino 12/03/2017, 10:20 PM

## 2017-12-03 NOTE — [facility] Group Notes (Signed)
LCSW Group Therapy Note 12/03/2017 1:15pm  Type of Therapy and Topic: Group Therapy: Feelings Around Returning Home & Establishing a Supportive Framework and Supporting Oneself When Supports Not Available  Participation Level: Active  Description of Group:  Patients first processed thoughts and feelings about upcoming discharge. These included fears of upcoming changes, lack of change, new living environments, judgements and expectations from others and overall stigma of mental health issues. The group then discussed the definition of a supportive framework, what that looks and feels like, and how do to discern it from an unhealthy non-supportive network. The group identified different types of supports as well as what to do when your family/friends are less than helpful or unavailable  Therapeutic Goals  1. Patient will identify one healthy supportive network that they can use at discharge. 2. Patient will identify one factor of a supportive framework and how to tell it from an unhealthy network. 3. Patient able to identify one coping skill to use when they do not have positive supports from others. 4. Patient will demonstrate ability to communicate their needs through discussion and/or role plays.  Summary of Patient Progress:  Patient reported she feels "good." Pt engaged during group session. As patients processed their anxiety about discharge and described healthy supports patient shared she is not ready to be discharge because she is nervous about going back to where she was staying.  Patients identified at least one self-care tool they were willing to use after discharge.   Therapeutic Modalities Cognitive Behavioral Therapy Motivational Interviewing   Alfio Loescher  CUEBAS-COLON, LCSW 12/03/2017 10:04 AM

## 2017-12-03 NOTE — Progress Notes (Signed)
[facility] MD Progress Note  12/03/2017 1:15 PM Melanie Bell  MRN:  [id number] Subjective: Follow-up patient was schizoaffective disorder.  She has no complaints.  Patient is sleeping adequately.  Eating adequately.  Keeps herself well groomed and interacts appropriately with others on the unit.  Denies hallucinations denies suicidal ideation or violence. Principal Problem: Schizoaffective disorder, bipolar type ([place]) Diagnosis:   Patient Active Problem List   Diagnosis Date Noted  . Self-inflicted injury [Y77.41] 11/23/2017  . ASCUS of cervix with negative high risk HPV [R87.610] 08/28/2017  . Malingering [Z76.5] 05/06/2016  . Foreign body aspiration [T17.900A]   . Other foreign object in esophagus causing other injury, subsequent encounter [T18.198D]   . Swallowed foreign body [T18.9XXA] 04/23/2016  . Foreign body ingestion [T18.9XXA] 04/21/2016  . Gastric foreign body [T18.2XXA]   . 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 Schizoaffective disorder, bipolar type ([place]) [F25.0] 11/06/2014  . Tobacco use disorder [F17.200] 09/30/2014  . Hypothyroidism [E03.9] 09/29/2014  . Hypertension [I10] 09/29/2014   Total Time spent with patient: 20 minutes  Past Psychiatric History: Long history of behavior problems with difficulty maintaining a residence because of her behavior outside the hospital.  Past Medical History:  Past Medical History:  Diagnosis Date  . Anxiety   . Asthma   . Depression   . GERD (gastroesophageal reflux disease)   . Hallucinations 09/30/2014   Sizoaffective  . Hyperlipidemia   . Hypertension   . Intentional ingestion of batteries 04/21/2016    2 AAA batteries and 1 thumb tack; intent to hurt herself/notes 04/21/2016  . Tardive dyskinesia 10/2014   recent onset    Past Surgical History:  Procedure Laterality Date  . ABDOMINAL SURGERY     "years ago" to remove foreign objects  . APPENDECTOMY    . BREAST EXCISIONAL BIOPSY Right 09/03/2007   surgical bx removed  fibroadeoma  . BREAST LUMPECTOMY Right   . COLONOSCOPY WITH PROPOFOL N/A 09/10/2015   Procedure: COLONOSCOPY WITH PROPOFOL;  Surgeon: [name], MD;  Location: [facility] ENDOSCOPY;  Service: Endoscopy;  Laterality: N/A;  . ESOPHAGOGASTRODUODENOSCOPY N/A 11/28/2014   Procedure: ESOPHAGOGASTRODUODENOSCOPY (EGD);  Surgeon: [name], MD;  Location[facility] ENDOSCOPY;  Service: Endoscopy;  Laterality: N/A;  . ESOPHAGOGASTRODUODENOSCOPY N/A 02/21/2016   Procedure: ESOPHAGOGASTRODUODENOSCOPY (EGD);  Surgeon: Mauri Pole, MD;  Location: [facility] ENDOSCOPY;  Service: Endoscopy;  Laterality: N/A;  . ESOPHAGOGASTRODUODENOSCOPY N/A 04/21/2016   Procedure: ESOPHAGOGASTRODUODENOSCOPY (EGD);  Surgeon: Gatha Mayer, MD;  Location: [facility] ENDOSCOPY;  Service: Endoscopy;  Laterality: N/A;  . ESOPHAGOGASTRODUODENOSCOPY N/A 05/06/2016   Procedure: ESOPHAGOGASTRODUODENOSCOPY (EGD);  Surgeon: Jonathon Bellows, MD;  Location: [facility] ENDOSCOPY;  Service: Gastroenterology;  Laterality: N/A;  . ESOPHAGOGASTRODUODENOSCOPY N/A 06/13/2016   Procedure: ESOPHAGOGASTRODUODENOSCOPY (EGD);  Surgeon: [name], MD;  Location: [facility] ENDOSCOPY;  Service: Endoscopy;  Laterality: N/A;  . ESOPHAGOGASTRODUODENOSCOPY (EGD) WITH PROPOFOL N/A 02/29/2016   Procedure: ESOPHAGOGASTRODUODENOSCOPY (EGD) WITH PROPOFOL;  Surgeon: [name], MD;  Location: [facility] ENDOSCOPY;  Service: Endoscopy;  Laterality: N/A;  . LAPAROTOMY N/A 09/12/2015   Procedure: EXPLORATORY LAPAROTOMY;  Surgeon: Florene Glen, MD;  Location: [facility] ORS;  Service: General;  Laterality: N/A;  . SIGMOIDOSCOPY N/A 09/12/2015   Procedure: Lonell Face;  Surgeon: Florene Glen, MD;  Location: [facility] ORS;  Service: General;  Laterality: N/A;  . WISDOM TOOTH EXTRACTION     Family History:  Family History  Problem Relation Age of Onset  . Depression Mother   . Hypertension Mother   . Sleep apnea Mother   .  Asthma Mother   . COPD Mother   . Diabetes Mother   . Breast cancer  Maternal Aunt        20's   Family Psychiatric  History: See previous note Social History:  Social History   Substance and Sexual Activity  Alcohol Use No     Social History   Substance and Sexual Activity  Drug Use No    Social History   Socioeconomic History  . Marital status: Single    Spouse name: Not on file  . Number of children: Not on file  . Years of education: Not on file  . Highest education level: Not on file  Occupational History  . Not on file  Social Needs  . Financial resource strain: Not on file  . Food insecurity:    Worry: Not on file    Inability: Not on file  . Transportation needs:    Medical: Not on file    Non-medical: Not on file  Tobacco Use  . Smoking status: Former Smoker    Packs/day: 0.50    Years: 3.00    Pack years: 1.50    Types: Cigarettes    Last attempt to quit: 08/2016    Years since quitting: 1.3  . Smokeless tobacco: Never Used  Substance and Sexual Activity  . Alcohol use: No  . Drug use: No  . Sexual activity: Never    Birth control/protection: Injection  Lifestyle  . Physical activity:    Days per week: Not on file    Minutes per session: Not on file  . Stress: Not on file  Relationships  . Social connections:    Talks on phone: Not on file    Gets together: Not on file    Attends religious service: Not on file    Active member of club or organization: Not on file    Attends meetings of clubs or organizations: Not on file    Relationship status: Not on file  Other Topics Concern  . Not on file  Social History Narrative  . Not on file   Additional Social History:                         Sleep: Fair  Appetite:  Fair  Current Medications: Current Facility-Administered Medications  Medication Dose Route Frequency Provider Last Rate Last Dose  . acetaminophen (TYLENOL) tablet 650 mg  650 mg Oral Q6H PRN [name], [name], MD   650 mg at 11/24/17 1710  . alum & mag hydroxide-simeth (MAALOX/MYLANTA)  200-200-20 MG/5ML suspension 30 mL  30 mL Oral Q4H PRN [name], [name], MD      . amLODipine (NORVASC) tablet 2.5 mg  2.5 mg Oral Daily [name], [name], MD   2.5 mg at 12/03/17 0845  . cephALEXin (KEFLEX) capsule 500 mg  500 mg Oral Q12H [name], [name], MD   500 mg at 12/03/17 0846  . cetirizine HCl (Zyrtec) 5 MG/5ML solution 10 mg  10 mg Oral BID PRN [name], [name], MD      . cloZAPine (CLOZARIL) tablet 150 mg  150 mg Oral QHS [name], [name], MD   150 mg at 12/02/17 2052  . hydrOXYzine (ATARAX/VISTARIL) tablet 50 mg  50 mg Oral Q6H PRN [name], [name], MD   50 mg at 11/23/17 2333  . levothyroxine (SYNTHROID, LEVOTHROID) tablet 100 mcg  100 mcg Oral QAC breakfast [name], [name], MD   100 mcg at  12/03/17 0845  . lithium carbonate (LITHOBID) CR tablet 300 mg  300 mg Oral BID WC [name], [name], MD   300 mg at 12/03/17 0845  . magnesium hydroxide (MILK OF MAGNESIA) suspension 30 mL  30 mL Oral Daily PRN [name], [name], MD      . neomycin-bacitracin-polymyxin (NEOSPORIN) ointment   Topical Daily [name], [name], MD      . OXcarbazepine (TRILEPTAL) tablet 150 mg  150 mg Oral BID [name], [name], MD   150 mg at 12/03/17 0942  . pantoprazole (PROTONIX) EC tablet 40 mg  40 mg Oral Daily [name], [name], MD   40 mg at 12/03/17 0845  . phenazopyridine (PYRIDIUM) tablet 100 mg  100 mg Oral TID WC [name], [name], MD   100 mg at 12/03/17 1229  . polyethylene glycol (MIRALAX / GLYCOLAX) packet 17 g  17 g Oral Daily PRN [name], [name], MD   17 g at 11/30/17 2130  . senna (SENOKOT) tablet 8.6 mg  1 tablet Oral Daily PRN [name], [name], MD      . traZODone (DESYREL) tablet 50 mg  50 mg Oral QHS [name], [name], MD   50 mg at 12/02/17 2053  . venlafaxine XR (EFFEXOR-XR) 24 hr capsule 150 mg  150 mg Oral Daily [name], [name], MD   150 mg at 12/03/17 0845  . [START ON 12/07/2017] Vitamin D (Ergocalciferol) (DRISDOL) capsule 50,000 Units  50,000 Units Oral Weekly [name], [name], MD         Lab Results: No results found for this or any previous visit (from the past 48 hour(s)).  Blood Alcohol level:  Lab Results  Component Value Date   ETH <10 11/23/2017   ETH <5 07/62/2633    Metabolic Disorder Labs: Lab Results  Component Value Date   HGBA1C 5.1 11/23/2017   MPG 99.67 11/23/2017   MPG 103 03/03/2016   Lab Results  Component Value Date   PROLACTIN 64.6 (H) 03/04/2016   PROLACTIN 15.8 06/16/2015   Lab Results  Component Value Date   CHOL 173 11/23/2017   TRIG 77 11/23/2017   HDL 41 11/23/2017   CHOLHDL 4.2 11/23/2017   VLDL 15 11/23/2017   LDLCALC 117 (H) 11/23/2017   LDLCALC 127 (H) 03/03/2016    Physical Findings: AIMS: Facial and Oral Movements Muscles of Facial Expression: None, normal Lips and Perioral Area: None, normal Jaw: None, normal Tongue: None, normal,Extremity Movements Upper (arms, wrists, hands, fingers): None, normal Lower (legs, knees, ankles, toes): None, normal, Trunk Movements Neck, shoulders, hips: None, normal, Overall Severity Severity of abnormal movements (highest score from questions above): None, normal Incapacitation due to abnormal movements: None, normal Patient's awareness of abnormal movements (rate only patient's report): No Awareness, Dental Status Current problems with teeth and/or dentures?: No Does patient usually wear dentures?: No  CIWA:    COWS:     Musculoskeletal: Strength & Muscle Tone: within normal limits Gait & Station: normal Patient leans: N/A  Psychiatric Specialty Exam: Physical Exam  Nursing note and vitals reviewed. Constitutional: She appears well-developed and well-nourished.  HENT:  Head: Normocephalic and atraumatic.  Eyes: Pupils are equal, round, and reactive to light. Conjunctivae are normal.  Neck: Normal range of motion.  Cardiovascular: Regular rhythm and normal heart sounds.  Respiratory: Effort normal. No respiratory distress.  GI: Soft.  Musculoskeletal: Normal range  of motion.  Neurological: She is alert.  Skin: Skin is warm and dry.  Psychiatric: Judgment normal.  Her affect is blunt. Her speech is delayed. She is slowed. Thought content is not paranoid. Cognition and memory are normal. She expresses no homicidal and no suicidal ideation.    Review of Systems  Constitutional: Negative.   HENT: Negative.   Eyes: Negative.   Respiratory: Negative.   Cardiovascular: Negative.   Gastrointestinal: Negative.   Musculoskeletal: Negative.   Skin: Negative.   Neurological: Negative.   Psychiatric/Behavioral: Negative.     Blood pressure 130/71, pulse 86, temperature 99.2 F (37.3 C), temperature source Oral, resp. rate 20, height 5\' 6"  (1.676 m), weight 97.1 kg (214 lb), SpO2 100 %.Body mass index is 34.54 kg/m.  General Appearance: Fairly Groomed  Eye Contact:  Fair  Speech:  Slow  Volume:  Decreased  Mood:  Euthymic  Affect:  Constricted  Thought Process:  Goal Directed  Orientation:  Full (Time, Place, and Person)  Thought Content:  Logical  Suicidal Thoughts:  No  Homicidal Thoughts:  No  Memory:  Immediate;   Fair Recent;   Fair Remote;   Fair  Judgement:  Fair  Insight:  Fair  Psychomotor Activity:  Decreased  Concentration:  Concentration: Fair  Recall:  [employer] of Knowledge:  Fair  Language:  Fair  Akathisia:  No  Handed:  Right  AIMS (if indicated):     Assets:  Desire for Improvement Physical Health Social Support  ADL's:  Intact  Cognition:  WNL  Sleep:  Number of Hours: 4.45     Treatment Plan Summary: Daily contact with patient to assess and evaluate symptoms and progress in treatment, Medication management and Plan Continue current medicine.  Continue encouraging her appropriate behavior on the unit.  Treatment team is mostly focused on finding an appropriate disposition for her.  Alethia Berthold, MD 12/03/2017, 1:15 PM

## 2017-12-03 NOTE — Plan of Care (Signed)
Patient is progressing in all areas and doing well in the unit, active participating in scheduled activities with peers, states her medicine is working well for her no distress noted , no complain voiced by patient so far. Contract for safety, denies SI/HI/AVH 15 minute rounding in progress. Problem: Activity: Goal: Interest or engagement in leisure activities will improve Outcome: Progressing Goal: Imbalance in normal sleep/wake cycle will improve Outcome: Progressing   Problem: Coping: Goal: Coping ability will improve Outcome: Progressing Goal: Will verbalize feelings Outcome: Progressing   Problem: Health Behavior/Discharge Planning: Goal: Ability to make decisions will improve Outcome: Progressing Goal: Compliance with therapeutic regimen will improve Outcome: Progressing   Problem: Safety: Goal: Ability to disclose and discuss suicidal ideas will improve Outcome: Progressing Goal: Ability to identify and utilize support systems that promote safety will improve Outcome: Progressing   Problem: Self-Concept: Goal: Will verbalize positive feelings about self Outcome: Progressing Goal: Level of anxiety will decrease Outcome: Progressing

## 2017-12-03 NOTE — Plan of Care (Signed)
  Problem: Coping: Goal: Coping ability will improve Outcome: Progressing   Problem: Health Behavior/Discharge Planning: Goal: Ability to make decisions will improve Outcome: Progressing   Problem: Safety: Goal: Ability to disclose and discuss suicidal ideas will improve Outcome: Progressing

## 2017-12-04 LAB — URINALYSIS, COMPLETE (UACMP) WITH MICROSCOPIC
Bacteria, UA: NONE SEEN
Bilirubin Urine: NEGATIVE
Glucose, UA: NEGATIVE mg/dL
Hgb urine dipstick: NEGATIVE
Ketones, ur: NEGATIVE mg/dL
Leukocytes, UA: NEGATIVE
Nitrite: NEGATIVE
Protein, ur: NEGATIVE mg/dL
Specific Gravity, Urine: 1.015 (ref 1.005–1.030)
pH: 6 (ref 5.0–8.0)

## 2017-12-04 NOTE — Progress Notes (Signed)
Nursing note 7p-7a  Pt observed interacting with peers on unit this shift. Displayed a bright affect and mood upon interaction with this Probation officer. Pt complains of pain in her wound on the right calf. Upon assessment wound is clean and dry, Tylenol administered as ordered. Pt also complains of constipations Miralax administered as ordered by MD. See MAR for medication administration. ,denies SI/HI, and also denies any audio or visual hallucinations at this time. Pt is able to verbally contract for safety with this RN. Goal:  " to try not to sleep all day and go to groups. Pt is now resting in bed with eyes closed, with no signs or symptoms of pain or distress noted. Pt continues to remain safe on the unit and is observed by rounding every 15 min. RN will continue to monitor.

## 2017-12-04 NOTE — Plan of Care (Signed)
Pt continues to progress towards goals and d/c. RN will continue to monitor.  Problem: Activity: Goal: Interest or engagement in leisure activities will improve Outcome: Progressing Goal: Imbalance in normal sleep/wake cycle will improve Outcome: Progressing   Problem: Coping: Goal: Coping ability will improve Outcome: Progressing Goal: Will verbalize feelings Outcome: Progressing   Problem: Health Behavior/Discharge Planning: Goal: Ability to make decisions will improve Outcome: Progressing Goal: Compliance with therapeutic regimen will improve Outcome: Progressing   Problem: Safety: Goal: Ability to disclose and discuss suicidal ideas will improve Outcome: Progressing Goal: Ability to identify and utilize support systems that promote safety will improve Outcome: Progressing   Problem: Self-Concept: Goal: Will verbalize positive feelings about self Outcome: Progressing Goal: Level of anxiety will decrease Outcome: Progressing

## 2017-12-04 NOTE — Progress Notes (Addendum)
[facility] MD Progress Note  12/05/2017 9:52 AM Melanie Bell  MRN:  [id number]  Subjective:    Melanie Bell is not allowed to return to her old group home. Her mother, the guardian, still not responding. SW will assist the patient to use face book. Apparently this isd the way patient has bee communicating with the mother.   Neglectful guardianship was reported to DSS. We are looking for new placement. The mother may be living off the grid in [place]. She is notoriously difficult to get in touch with.   The patient has not complaints. She has been dealing with this stressful and unusual situation very well.   Principal Problem: Schizoaffective disorder, bipolar type ([place]) Diagnosis:   Patient Active Problem List   Diagnosis Date Noted  . Schizoaffective disorder, bipolar type ([place]) [F25.0] 11/06/2014    Priority: High  . Self-inflicted injury [T61.44] 11/23/2017  . ASCUS of cervix with negative high risk HPV [R87.610] 08/28/2017  . Malingering [Z76.5] 05/06/2016  . Foreign body aspiration [T17.900A]   . Other foreign object in esophagus causing other injury, subsequent encounter [T18.198D]   . Swallowed foreign body [T18.9XXA] 04/23/2016  . Foreign body ingestion [T18.9XXA] 04/21/2016  . Gastric foreign body [T18.2XXA]   . 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 Tobacco use disorder [F17.200] 09/30/2014  . Hypothyroidism [E03.9] 09/29/2014  . Hypertension [I10] 09/29/2014   Total Time spent with patient: 20 minutes  Past Psychiatric History: schizoaffective disorder  Past Medical History:  Past Medical History:  Diagnosis Date  . Anxiety   . Asthma   . Depression   . GERD (gastroesophageal reflux disease)   . Hallucinations 09/30/2014   Sizoaffective  . Hyperlipidemia   . Hypertension   . Intentional ingestion of batteries 04/21/2016    2 AAA batteries and 1 thumb tack; intent to hurt herself/notes 04/21/2016  . Tardive dyskinesia 10/2014   recent onset    Past Surgical History:   Procedure Laterality Date  . ABDOMINAL SURGERY     "years ago" to remove foreign objects  . APPENDECTOMY    . BREAST EXCISIONAL BIOPSY Right 09/03/2007   surgical bx removed fibroadeoma  . BREAST LUMPECTOMY Right   . COLONOSCOPY WITH PROPOFOL N/A 09/10/2015   Procedure: COLONOSCOPY WITH PROPOFOL;  Surgeon: [name], MD;  Location: [facility] ENDOSCOPY;  Service: Endoscopy;  Laterality: N/A;  . ESOPHAGOGASTRODUODENOSCOPY N/A 11/28/2014   Procedure: ESOPHAGOGASTRODUODENOSCOPY (EGD);  Surgeon: [name], MD;  Location: [facility] ENDOSCOPY;  Service: Endoscopy;  Laterality: N/A;  . ESOPHAGOGASTRODUODENOSCOPY N/A 02/21/2016   Procedure: ESOPHAGOGASTRODUODENOSCOPY (EGD);  Surgeon: Mauri Pole, MD;  Location: [facility] ENDOSCOPY;  Service: Endoscopy;  Laterality: N/A;  . ESOPHAGOGASTRODUODENOSCOPY N/A 04/21/2016   Procedure: ESOPHAGOGASTRODUODENOSCOPY (EGD);  Surgeon: Gatha Mayer, MD;  Location: [facility] ENDOSCOPY;  Service: Endoscopy;  Laterality: N/A;  . ESOPHAGOGASTRODUODENOSCOPY N/A 05/06/2016   Procedure: ESOPHAGOGASTRODUODENOSCOPY (EGD);  Surgeon: Jonathon Bellows, MD;  Location: [facility] ENDOSCOPY;  Service: Gastroenterology;  Laterality: N/A;  . ESOPHAGOGASTRODUODENOSCOPY N/A 06/13/2016   Procedure: ESOPHAGOGASTRODUODENOSCOPY (EGD);  Surgeon: [name], MD;  Location: [facility] ENDOSCOPY;  Service: Endoscopy;  Laterality: N/A;  . ESOPHAGOGASTRODUODENOSCOPY (EGD) WITH PROPOFOL N/A 02/29/2016   Procedure: ESOPHAGOGASTRODUODENOSCOPY (EGD) WITH PROPOFOL;  Surgeon: [name], MD;  Location: [facility] ENDOSCOPY;  Service: Endoscopy;  Laterality: N/A;  . LAPAROTOMY N/A 09/12/2015   Procedure: EXPLORATORY LAPAROTOMY;  Surgeon: Florene Glen, MD;  Location: [facility] ORS;  Service: General;  Laterality: N/A;  . SIGMOIDOSCOPY N/A 09/12/2015   Procedure: Lonell Face;  Surgeon: Jerrol Banana  Burt Knack, MD;  Location: [facility] ORS;  Service: General;  Laterality: N/A;  . WISDOM TOOTH EXTRACTION     Family History:  Family History   Problem Relation Age of Onset  . Depression Mother   . Hypertension Mother   . Sleep apnea Mother   . Asthma Mother   . COPD Mother   . Diabetes Mother   . Breast cancer Maternal Aunt        20's   Family Psychiatric  History: depression Social History:  Social History   Substance and Sexual Activity  Alcohol Use No     Social History   Substance and Sexual Activity  Drug Use No    Social History   Socioeconomic History  . Marital status: Single    Spouse name: Not on file  . Number of children: Not on file  . Years of education: Not on file  . Highest education level: Not on file  Occupational History  . Not on file  Social Needs  . Financial resource strain: Not on file  . Food insecurity:    Worry: Not on file    Inability: Not on file  . Transportation needs:    Medical: Not on file    Non-medical: Not on file  Tobacco Use  . Smoking status: Former Smoker    Packs/day: 0.50    Years: 3.00    Pack years: 1.50    Types: Cigarettes    Last attempt to quit: 08/2016    Years since quitting: 1.3  . Smokeless tobacco: Never Used  Substance and Sexual Activity  . Alcohol use: No  . Drug use: No  . Sexual activity: Never    Birth control/protection: Injection  Lifestyle  . Physical activity:    Days per week: Not on file    Minutes per session: Not on file  . Stress: Not on file  Relationships  . Social connections:    Talks on phone: Not on file    Gets together: Not on file    Attends religious service: Not on file    Active member of club or organization: Not on file    Attends meetings of clubs or organizations: Not on file    Relationship status: Not on file  Other Topics Concern  . Not on file  Social History Narrative  . Not on file   Additional Social History:                         Sleep: Fair  Appetite:  Fair  Current Medications: Current Facility-Administered Medications  Medication Dose Route Frequency Provider Last  Rate Last Dose  . acetaminophen (TYLENOL) tablet 650 mg  650 mg Oral Q6H PRN [name], [name], MD   650 mg at 12/03/17 2133  . alum & mag hydroxide-simeth (MAALOX/MYLANTA) 200-200-20 MG/5ML suspension 30 mL  30 mL Oral Q4H PRN [name], [name], MD      . amLODipine (NORVASC) tablet 2.5 mg  2.5 mg Oral Daily [name], [name], MD   2.5 mg at 12/05/17 0802  . cephALEXin (KEFLEX) capsule 500 mg  500 mg Oral Q12H Marquest Gunkel B, MD   500 mg at 12/05/17 0802  . cetirizine HCl (Zyrtec) 5 MG/5ML solution 10 mg  10 mg Oral BID PRN [name], [name], MD      . cloZAPine (CLOZARIL) tablet 150 mg  150 mg Oral QHS [name], [name], MD   150 mg at 12/04/17  2113  . hydrOXYzine (ATARAX/VISTARIL) tablet 50 mg  50 mg Oral Q6H PRN [name], [name], MD   50 mg at 11/23/17 2333  . levothyroxine (SYNTHROID, LEVOTHROID) tablet 100 mcg  100 mcg Oral QAC breakfast [name], [name], MD   100 mcg at 12/05/17 0805  . lithium carbonate (LITHOBID) CR tablet 300 mg  300 mg Oral BID WC [name], [name], MD   300 mg at 12/05/17 0806  . magnesium hydroxide (MILK OF MAGNESIA) suspension 30 mL  30 mL Oral Daily PRN [name], [name], MD      . neomycin-bacitracin-polymyxin (NEOSPORIN) ointment   Topical Daily Traeson Dusza B, MD      . OXcarbazepine (TRILEPTAL) tablet 150 mg  150 mg Oral BID [name], [name], MD   150 mg at 12/05/17 0802  . pantoprazole (PROTONIX) EC tablet 40 mg  40 mg Oral Daily [name], [name], MD   40 mg at 12/05/17 0802  . polyethylene glycol (MIRALAX / GLYCOLAX) packet 17 g  17 g Oral Daily PRN [name], [name], MD   17 g at 12/03/17 2132  . senna (SENOKOT) tablet 8.6 mg  1 tablet Oral Daily PRN [name], [name], MD      . traZODone (DESYREL) tablet 50 mg  50 mg Oral QHS [name], [name], MD   50 mg at 12/04/17 2113  . venlafaxine XR (EFFEXOR-XR) 24 hr capsule 150 mg  150 mg Oral Daily [name], [name], MD   150 mg at 12/05/17 0802  . [START ON 12/07/2017] Vitamin D (Ergocalciferol) (DRISDOL) capsule 50,000 Units   50,000 Units Oral Weekly [name], [name], MD        Lab Results:  Results for orders placed or performed during the hospital encounter of 11/23/17 (from the past 48 hour(s))  Urinalysis, Complete w Microscopic     Status: Abnormal   Collection Time: 12/04/17  9:55 PM  Result Value Ref Range   Color, Urine YELLOW (A) YELLOW   APPearance CLEAR (A) CLEAR   Specific Gravity, Urine 1.015 1.005 - 1.030   pH 6.0 5.0 - 8.0   Glucose, UA NEGATIVE NEGATIVE mg/dL   Hgb urine dipstick NEGATIVE NEGATIVE   Bilirubin Urine NEGATIVE NEGATIVE   Ketones, ur NEGATIVE NEGATIVE mg/dL   Protein, ur NEGATIVE NEGATIVE mg/dL   Nitrite NEGATIVE NEGATIVE   Leukocytes, UA NEGATIVE NEGATIVE   WBC, UA 0-5 0 - 5 WBC/hpf   Bacteria, UA NONE SEEN NONE SEEN   Squamous Epithelial / LPF 6-10 0 - 5   Mucus PRESENT     Comment: Performed at [facility], [address] [place], [place] [address]    Blood Alcohol level:  Lab Results  Component Value Date   [facility] <10 11/23/2017   ETH <5 44/81/8563    Metabolic Disorder Labs: Lab Results  Component Value Date   HGBA1C 5.1 11/23/2017   MPG 99.67 11/23/2017   MPG 103 03/03/2016   Lab Results  Component Value Date   PROLACTIN 64.6 (H) 03/04/2016   PROLACTIN 15.8 06/16/2015   Lab Results  Component Value Date   CHOL 173 11/23/2017   TRIG 77 11/23/2017   HDL 41 11/23/2017   CHOLHDL 4.2 11/23/2017   VLDL 15 11/23/2017   LDLCALC 117 (H) 11/23/2017   LDLCALC 127 (H) 03/03/2016    Physical Findings: AIMS: Facial and Oral Movements Muscles of Facial Expression: None, normal Lips and Perioral Area: None, normal Jaw: None, normal Tongue: None, normal,Extremity Movements Upper (  arms, wrists, hands, fingers): None, normal Lower (legs, knees, ankles, toes): None, normal, Trunk Movements Neck, shoulders, hips: None, normal, Overall Severity Severity of abnormal movements (highest score from questions above): None, normal Incapacitation due to  abnormal movements: None, normal Patient's awareness of abnormal movements (rate only patient's report): No Awareness, Dental Status Current problems with teeth and/or dentures?: No Does patient usually wear dentures?: No  CIWA:    COWS:     Musculoskeletal: Strength & Muscle Tone: within normal limits Gait & Station: normal Patient leans: N/A  Psychiatric Specialty Exam: Physical Exam  Nursing note and vitals reviewed. Psychiatric: She has a normal mood and affect. Her speech is normal and behavior is normal. Thought content normal. Cognition and memory are normal. She expresses impulsivity.    Review of Systems  Neurological: Negative.   Psychiatric/Behavioral: Negative.   All other systems reviewed and are negative.   Blood pressure 107/68, pulse 92, temperature 98 F (36.7 C), temperature source Oral, resp. rate 18, height 5\' 6"  (1.676 m), weight 97.1 kg (214 lb), SpO2 100 %.Body mass index is 34.54 kg/m.  General Appearance: Casual  Eye Contact:  Good  Speech:  Clear and Coherent  Volume:  Normal  Mood:  Euthymic  Affect:  Appropriate  Thought Process:  Goal Directed and Descriptions of Associations: Intact  Orientation:  Full (Time, Place, and Person)  Thought Content:  WDL  Suicidal Thoughts:  No  Homicidal Thoughts:  No  Memory:  Immediate;   Fair Recent;   Fair Remote;   Fair  Judgement:  Impaired  Insight:  Shallow  Psychomotor Activity:  Normal  Concentration:  Concentration: Fair and Attention Span: Fair  Recall:  [employer] of Knowledge:  Fair  Language:  Fair  Akathisia:  No  Handed:  Right  AIMS (if indicated):     Assets:  Communication Skills Desire for Improvement Financial Resources/Insurance Physical Health Resilience  ADL's:  Intact  Cognition:  WNL  Sleep:  Number of Hours: 6     Treatment Plan Summary: Daily contact with patient to assess and evaluate symptoms and progress in treatment and Medication management   [name] is a  27 year old female with a history of schizoaffective disorder and self injurious behavior admitted after suicidal gesture by cutting her arm with a steak knife in the context of losing her place at the group home.No medication changes were offered. At the time of discharge, the patient is no longer suicidal. She is able to contract for safety. She is forward thinking and optimistic about the future.  Unable to discharge for lack of contact with the guardian.  #Mood/psychosis -continue Clozapine 150 mg nightly -her dose should be increased as Clo/Nclo level is low 314 on 7/11 but we are unable to do so without guardian's permission -continue Lithium 300 mg BID, Li0.54 -Trileptal 150 mg BID -Effexor 150 mg daily  #HTN -continueLisinopril 2.5 mg daily  #Hypothyroidism -Synthroid 100 ug daily  #GERD -Protonix 40 mg daily  #Constipation -bowel regimen  #Skin cuts, healed  #UTI -continue Keflex 500 mg BID -discontinue Pyridium TID -urine cx grew E. Coli -repeat UA  #Social -incompetent adult -unable to reach hermother,the guardian  #Labs -lipid panel, TSH and A1Care normal -EKGreviewed, NSR with QTc 437 -pregnancy test is negative  #Disposition -not allowed to return to her group home -search for new placement under way but needs a guardian -PPD placed -follow up with[facility] team     Orson Slick, MD 12/05/2017, 9:52  AM

## 2017-12-04 NOTE — Progress Notes (Signed)
Recreation Therapy Notes  Date: 12/04/2017  Time: 9:30 am   Location: Craft Room   Behavioral response: N/A   Intervention Topic: Creative Expressions  Discussion/Intervention: Patient did not attend group.   Clinical Observations/Feedback:  Patient did not attend group.   Hector Taft LRT/CTRS        Armaan Pond 12/04/2017 10:24 AM

## 2017-12-04 NOTE — Plan of Care (Signed)
Patient is alert and oriented X4, denies SI, HI and AVH. Patient has no complaints today. Patient states urination is much better than last week symptoms of UTI has decreased. Patient is pleasant and compliant with medications and therapies. Patient slept during breakfast; stating she usually eats lunch and dinner. Patient is logical and coherent no confusion; interaction is minimal with staff. Patient does shower. Nurse will continue to monitor. Problem: Activity: Goal: Interest or engagement in leisure activities will improve Outcome: Progressing Goal: Imbalance in normal sleep/wake cycle will improve Outcome: Progressing   Problem: Coping: Goal: Coping ability will improve Outcome: Progressing Goal: Will verbalize feelings Outcome: Progressing   Problem: Health Behavior/Discharge Planning: Goal: Ability to make decisions will improve Outcome: Progressing Goal: Compliance with therapeutic regimen will improve Outcome: Progressing   Problem: Safety: Goal: Ability to disclose and discuss suicidal ideas will improve Outcome: Progressing Goal: Ability to identify and utilize support systems that promote safety will improve Outcome: Progressing

## 2017-12-04 NOTE — [facility] Group Notes (Signed)
[facility] Group Notes:  (Nursing/MHT/Case Management/Adjunct)  Date:  12/04/2017  Time:  11:15 PM  Type of Therapy:  Group Therapy  Participation Level:  Active  Participation Quality:  Appropriate  Affect:  Appropriate  Cognitive:  Appropriate  Insight:  Appropriate  Engagement in Group:  Engaged  Modes of Intervention:  Discussion  Summary of Progress/Problems:  Kandis Fantasia 12/04/2017, 11:15 PM

## 2017-12-04 NOTE — Progress Notes (Addendum)
[facility] MD Progress Note  12/04/2017 10:57 AM Melanie Bell  MRN:  [id number]  Subjective:    Melanie Bell denies any symptoms of depression, anxiety or psychosis. She is not suicidal or homicidal. Symptoms of UTI have resolved. She tolerates medications well. No somatic complaints. Good group participation. She still is rather sleepy in the mornings and spends a lot of time in bed.   We have not been able to contact her mother, who is the guardian and lives in [place]. APS report for neglectful guardian was submitted.   We are still uncertain if the patient may return to her old group home where she received 30 day notice.   Principal Problem: Schizoaffective disorder, bipolar type ([place]) Diagnosis:   Patient Active Problem List   Diagnosis Date Noted  . Schizoaffective disorder, bipolar type ([place]) [F25.0] 11/06/2014    Priority: High  . Self-inflicted injury [M84.13] 11/23/2017  . ASCUS of cervix with negative high risk HPV [R87.610] 08/28/2017  . Malingering [Z76.5] 05/06/2016  . Foreign body aspiration [T17.900A]   . Other foreign object in esophagus causing other injury, subsequent encounter [T18.198D]   . Swallowed foreign body [T18.9XXA] 04/23/2016  . Foreign body ingestion [T18.9XXA] 04/21/2016  . Gastric foreign body [T18.2XXA]   . 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 Tobacco use disorder [F17.200] 09/30/2014  . Hypothyroidism [E03.9] 09/29/2014  . Hypertension [I10] 09/29/2014   Total Time spent with patient: 20 minutes  Past Psychiatric History: schizoaffective disorder  Past Medical History:  Past Medical History:  Diagnosis Date  . Anxiety   . Asthma   . Depression   . GERD (gastroesophageal reflux disease)   . Hallucinations 09/30/2014   Sizoaffective  . Hyperlipidemia   . Hypertension   . Intentional ingestion of batteries 04/21/2016    2 AAA batteries and 1 thumb tack; intent to hurt herself/notes 04/21/2016  . Tardive dyskinesia 10/2014   recent onset     Past Surgical History:  Procedure Laterality Date  . ABDOMINAL SURGERY     "years ago" to remove foreign objects  . APPENDECTOMY    . BREAST EXCISIONAL BIOPSY Right 09/03/2007   surgical bx removed fibroadeoma  . BREAST LUMPECTOMY Right   . COLONOSCOPY WITH PROPOFOL N/A 09/10/2015   Procedure: COLONOSCOPY WITH PROPOFOL;  Surgeon: [name], MD;  Location: [facility] ENDOSCOPY;  Service: Endoscopy;  Laterality: N/A;  . ESOPHAGOGASTRODUODENOSCOPY N/A 11/28/2014   Procedure: ESOPHAGOGASTRODUODENOSCOPY (EGD);  Surgeon: [name], MD;  Location: [facility] ENDOSCOPY;  Service: Endoscopy;  Laterality: N/A;  . ESOPHAGOGASTRODUODENOSCOPY N/A 02/21/2016   Procedure: ESOPHAGOGASTRODUODENOSCOPY (EGD);  Surgeon: Mauri Pole, MD;  Location: [facility] ENDOSCOPY;  Service: Endoscopy;  Laterality: N/A;  . ESOPHAGOGASTRODUODENOSCOPY N/A 04/21/2016   Procedure: ESOPHAGOGASTRODUODENOSCOPY (EGD);  Surgeon: Gatha Mayer, MD;  Location: [facility] ENDOSCOPY;  Service: Endoscopy;  Laterality: N/A;  . ESOPHAGOGASTRODUODENOSCOPY N/A 05/06/2016   Procedure: ESOPHAGOGASTRODUODENOSCOPY (EGD);  Surgeon: Jonathon Bellows, MD;  Location: [facility] ENDOSCOPY;  Service: Gastroenterology;  Laterality: N/A;  . ESOPHAGOGASTRODUODENOSCOPY N/A 06/13/2016   Procedure: ESOPHAGOGASTRODUODENOSCOPY (EGD);  Surgeon: [name], MD;  Location: [facility] ENDOSCOPY;  Service: Endoscopy;  Laterality: N/A;  . ESOPHAGOGASTRODUODENOSCOPY (EGD) WITH PROPOFOL N/A 02/29/2016   Procedure: ESOPHAGOGASTRODUODENOSCOPY (EGD) WITH PROPOFOL;  Surgeon: [name], MD;  Location: [facility] ENDOSCOPY;  Service: Endoscopy;  Laterality: N/A;  . LAPAROTOMY N/A 09/12/2015   Procedure: EXPLORATORY LAPAROTOMY;  Surgeon: Florene Glen, MD;  Location: [facility] ORS;  Service: General;  Laterality: N/A;  . SIGMOIDOSCOPY N/A 09/12/2015   Procedure: SIGMOIDOSCOPY;  Surgeon: Florene Glen, MD;  Location: [facility] ORS;  Service: General;  Laterality: N/A;  . WISDOM TOOTH EXTRACTION     Family  History:  Family History  Problem Relation Age of Onset  . Depression Mother   . Hypertension Mother   . Sleep apnea Mother   . Asthma Mother   . COPD Mother   . Diabetes Mother   . Breast cancer Maternal Aunt        20's   Family Psychiatric  History: depression Social History:  Social History   Substance and Sexual Activity  Alcohol Use No     Social History   Substance and Sexual Activity  Drug Use No    Social History   Socioeconomic History  . Marital status: Single    Spouse name: Not on file  . Number of children: Not on file  . Years of education: Not on file  . Highest education level: Not on file  Occupational History  . Not on file  Social Needs  . Financial resource strain: Not on file  . Food insecurity:    Worry: Not on file    Inability: Not on file  . Transportation needs:    Medical: Not on file    Non-medical: Not on file  Tobacco Use  . Smoking status: Former Smoker    Packs/day: 0.50    Years: 3.00    Pack years: 1.50    Types: Cigarettes    Last attempt to quit: 08/2016    Years since quitting: 1.3  . Smokeless tobacco: Never Used  Substance and Sexual Activity  . Alcohol use: No  . Drug use: No  . Sexual activity: Never    Birth control/protection: Injection  Lifestyle  . Physical activity:    Days per week: Not on file    Minutes per session: Not on file  . Stress: Not on file  Relationships  . Social connections:    Talks on phone: Not on file    Gets together: Not on file    Attends religious service: Not on file    Active member of club or organization: Not on file    Attends meetings of clubs or organizations: Not on file    Relationship status: Not on file  Other Topics Concern  . Not on file  Social History Narrative  . Not on file   Additional Social History:                         Sleep: Fair  Appetite:  Fair  Current Medications: Current Facility-Administered Medications  Medication Dose  Route Frequency Provider Last Rate Last Dose  . acetaminophen (TYLENOL) tablet 650 mg  650 mg Oral Q6H PRN [name], [name], MD   650 mg at 12/03/17 2133  . alum & mag hydroxide-simeth (MAALOX/MYLANTA) 200-200-20 MG/5ML suspension 30 mL  30 mL Oral Q4H PRN [name], [name], MD      . amLODipine (NORVASC) tablet 2.5 mg  2.5 mg Oral Daily [name], [name], MD   2.5 mg at 12/04/17 0758  . cephALEXin (KEFLEX) capsule 500 mg  500 mg Oral Q12H Rustin Erhart B, MD   500 mg at 12/04/17 0758  . cetirizine HCl (Zyrtec) 5 MG/5ML solution 10 mg  10 mg Oral BID PRN [name], [name], MD      . cloZAPine (CLOZARIL) tablet 150 mg  150 mg Oral QHS [name], [name], MD   150  mg at 12/03/17 2130  . hydrOXYzine (ATARAX/VISTARIL) tablet 50 mg  50 mg Oral Q6H PRN [name], [name], MD   50 mg at 11/23/17 2333  . levothyroxine (SYNTHROID, LEVOTHROID) tablet 100 mcg  100 mcg Oral QAC breakfast [name], [name], MD   100 mcg at 12/04/17 0758  . lithium carbonate (LITHOBID) CR tablet 300 mg  300 mg Oral BID WC [name], [name], MD   300 mg at 12/04/17 0807  . magnesium hydroxide (MILK OF MAGNESIA) suspension 30 mL  30 mL Oral Daily PRN [name], [name], MD      . neomycin-bacitracin-polymyxin (NEOSPORIN) ointment   Topical Daily Kensie Susman B, MD      . OXcarbazepine (TRILEPTAL) tablet 150 mg  150 mg Oral BID [name], [name], MD   150 mg at 12/04/17 1023  . pantoprazole (PROTONIX) EC tablet 40 mg  40 mg Oral Daily [name], [name], MD   40 mg at 12/04/17 0758  . polyethylene glycol (MIRALAX / GLYCOLAX) packet 17 g  17 g Oral Daily PRN [name], [name], MD   17 g at 12/03/17 2132  . senna (SENOKOT) tablet 8.6 mg  1 tablet Oral Daily PRN [name], [name], MD      . traZODone (DESYREL) tablet 50 mg  50 mg Oral QHS [name], [name], MD   50 mg at 12/03/17 2133  . venlafaxine XR (EFFEXOR-XR) 24 hr capsule 150 mg  150 mg Oral Daily [name], [name], MD   150 mg at 12/04/17 0758  . [START ON 12/07/2017] Vitamin D (Ergocalciferol)  (DRISDOL) capsule 50,000 Units  50,000 Units Oral Weekly [name], [name], MD        Lab Results: No results found for this or any previous visit (from the past 48 hour(s)).  Blood Alcohol level:  Lab Results  Component Value Date   ETH <10 11/23/2017   ETH <5 37/90/2409    Metabolic Disorder Labs: Lab Results  Component Value Date   HGBA1C 5.1 11/23/2017   MPG 99.67 11/23/2017   MPG 103 03/03/2016   Lab Results  Component Value Date   PROLACTIN 64.6 (H) 03/04/2016   PROLACTIN 15.8 06/16/2015   Lab Results  Component Value Date   CHOL 173 11/23/2017   TRIG 77 11/23/2017   HDL 41 11/23/2017   CHOLHDL 4.2 11/23/2017   VLDL 15 11/23/2017   LDLCALC 117 (H) 11/23/2017   LDLCALC 127 (H) 03/03/2016    Physical Findings: AIMS: Facial and Oral Movements Muscles of Facial Expression: None, normal Lips and Perioral Area: None, normal Jaw: None, normal Tongue: None, normal,Extremity Movements Upper (arms, wrists, hands, fingers): None, normal Lower (legs, knees, ankles, toes): None, normal, Trunk Movements Neck, shoulders, hips: None, normal, Overall Severity Severity of abnormal movements (highest score from questions above): None, normal Incapacitation due to abnormal movements: None, normal Patient's awareness of abnormal movements (rate only patient's report): No Awareness, Dental Status Current problems with teeth and/or dentures?: No Does patient usually wear dentures?: No  CIWA:    COWS:     Musculoskeletal: Strength & Muscle Tone: within normal limits Gait & Station: normal Patient leans: N/A  Psychiatric Specialty Exam: Physical Exam  Nursing note and vitals reviewed. Psychiatric: She has a normal mood and affect. Her speech is normal and behavior is normal. Thought content normal. Cognition and memory are normal. She expresses impulsivity.    Review of Systems  Neurological: Negative.   Psychiatric/Behavioral: Negative.   All other systems reviewed  and  are negative.   Blood pressure 115/60, pulse 86, temperature 99.2 F (37.3 C), temperature source Oral, resp. rate 20, height 5\' 6"  (1.676 m), weight 97.1 kg (214 lb), SpO2 100 %.Body mass index is 34.54 kg/m.  General Appearance: Casual  Eye Contact:  Good  Speech:  Clear and Coherent  Volume:  Normal  Mood:  Anxious  Affect:  Flat  Thought Process:  Goal Directed and Descriptions of Associations: Intact  Orientation:  Full (Time, Place, and Person)  Thought Content:  WDL  Suicidal Thoughts:  No  Homicidal Thoughts:  No  Memory:  Immediate;   Fair Recent;   Fair Remote;   Fair  Judgement:  Impaired  Insight:  Lacking  Psychomotor Activity:  Normal  Concentration:  Concentration: Fair and Attention Span: Fair  Recall:  [employer] of Knowledge:  Fair  Language:  Fair  Akathisia:  No  Handed:  Right  AIMS (if indicated):     Assets:  Communication Skills Desire for Improvement Financial Resources/Insurance Physical Health Resilience  ADL's:  Intact  Cognition:  WNL  Sleep:  Number of Hours: 7.5     Treatment Plan Summary: Daily contact with patient to assess and evaluate symptoms and progress in treatment and Medication management   Melanie Bell is a 27 year old female with a history of schizoaffective disorder and self injurious behavior admitted after suicidal gesture by cutting her arm with a steak knife in the context of losing her place at the group home.No medication changes were offered. At the time of discharge, the patient is no longer suicidal. She is able to contract for safety. She is forward thinking and optimistic about the future.  Unable to discharge for lack of contact with the guardian.  #Mood/psychosis -continue Clozapine 150 mg nightly -her dose should be increased as Clo/Nclo level is low 314 on 7/11 but we are unable to do so without guardian's permission -continue Lithium 300 mg BID, Li0.54 -Trileptal 150 mg BID -Effexor 150 mg  daily  #HTN -continueLisinopril 2.5 mg daily  #Hypothyroidism -Synthroid 100 ug daily  #GERD -Protonix 40 mg daily  #Constipation -bowel regimen  #Skin cuts, healed  #UTI -continue Keflex 500 mg BID -discontinue Pyridium TID -urine cx grew E. Coli -repeat UA  #Social -incompetent adult -unable to reach her mother, the guardian  #Labs -lipid panel, TSH and A1Care normal -EKGreviewed, NSR with QTc 437 -pregnancy test is negative  #Disposition -TBE -uncertain if she is allowed to return to her group home as 30 day notice was issued -follow up with[facility] team    Orson Slick, MD 12/04/2017, 10:57 AM

## 2017-12-05 DIAGNOSIS — F250 Schizoaffective disorder, bipolar type: Principal | ICD-10-CM

## 2017-12-05 MED ORDER — TUBERCULIN PPD 5 UNIT/0.1ML ID SOLN
5.0000 [IU] | Freq: Once | INTRADERMAL | Status: AC
  Administered 2017-12-05: 5 [IU] via INTRADERMAL
  Filled 2017-12-05: qty 0.1

## 2017-12-05 NOTE — Progress Notes (Signed)
Recreation Therapy Notes   Date: 12/05/2017  Time: 9:30 am  Location: Craft Room  Behavioral response: Appropriate    Intervention Topic: Values  Discussion/Intervention:  Group content today was focused on values. The group identified what values are and where they come from. Individuals expressed some values and how many they have. Patients described how they go about adding or removing values. The group described the importance of having values and how they go about using them in daily life. Patient participated in the intervention "My Values" where they were able to pick out values that were important to them and make a visual aide.  Clinical Observations/Feedback:  Patient came to group and defined values as something that is important to you. She identified family as an important value to her. Individual stated values can change how you feel about yourself; and can give you a sense of purpose. Patient was social with peers and staff during the intervention. Jalynn Waddell LRT/CTRS          Kenly Henckel 12/05/2017 10:53 AM

## 2017-12-05 NOTE — Progress Notes (Signed)
Patient ID: Melanie Bell, female   DOB: 08/24/1990, 28 y.o.   MRN: [id number] PER STATE REGULATIONS 482.30  THIS CHART WAS REVIEWED FOR MEDICAL NECESSITY WITH RESPECT TO THE PATIENT'S ADMISSION/ DURATION OF STAY.  NEXT REVIEW DATE: 12/09/2017  Chauncy Lean, RN, BSN CASE MANAGER

## 2017-12-05 NOTE — [facility] Group Notes (Signed)
CSW Group Therapy Note  12/05/2017  Time:  0900  Type of Therapy and Topic: Group Therapy: Goals Group: SMART Goals    Participation Level:  Active    Description of Group:   The purpose of a daily goals group is to assist and guide patients in setting recovery/wellness-related goals. The objective is to set goals as they relate to the crisis in which they were admitted. Patients will be using SMART goal modalities to set measurable goals. Characteristics of realistic goals will be discussed and patients will be assisted in setting and processing how one will reach their goal. Facilitator will also assist patients in applying interventions and coping skills learned in psycho-education groups to the SMART goal and process how one will achieve defined goal.    Therapeutic Goals:  -Patients will develop and document one goal related to or their crisis in which brought them into treatment.  -Patients will be guided by LCSW using SMART goal setting modality in how to set a measurable, attainable, realistic and time sensitive goal.  -Patients will process barriers in reaching goal.  -Patients will process interventions in how to overcome and successful in reaching goal.    Patient's Goal:  Pt continues to work towards their tx goals but has not yet reached them. Pt was able to appropriately participate in group discussion, and was able to offer support/validation to other group members.  Pt reported her goal for the day is, "to stay out of bed and be more active by going to at least two groups."   Therapeutic Modalities:  Motivational Interviewing  Cognitive Behavioral Therapy  Crisis Intervention Model  SMART goals setting  Alden Hipp, MSW, LCSW Clinical Social Worker 12/05/2017 9:51 AM

## 2017-12-05 NOTE — Progress Notes (Signed)
[facility] MD Progress Note  12/06/2017 12:43 PM Melanie Bell  MRN:  [id number]  Subjective:    [name] has been ready for discharge. We identfied a group home to accept her on Thursday. We were able to get in touch with her mother, the guardian and will discharge to a new group home tomorrow.  hope to get in touch with her guardian by then.  Principal Problem: Schizoaffective disorder, bipolar type ([place]) Diagnosis:   Patient Active Problem List   Diagnosis Date Noted  . Schizoaffective disorder, bipolar type ([place]) [F25.0] 11/06/2014    Priority: High  . Self-inflicted injury [K44.01] 11/23/2017  . ASCUS of cervix with negative high risk HPV [R87.610] 08/28/2017  . Malingering [Z76.5] 05/06/2016  . Foreign body aspiration [T17.900A]   . Other foreign object in esophagus causing other injury, subsequent encounter [T18.198D]   . Swallowed foreign body [T18.9XXA] 04/23/2016  . Foreign body ingestion [T18.9XXA] 04/21/2016  . Gastric foreign body [T18.2XXA]   . 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 Tobacco use disorder [F17.200] 09/30/2014  . Hypothyroidism [E03.9] 09/29/2014  . Hypertension [I10] 09/29/2014   Total Time spent with patient: 20 minutes  Past Psychiatric History: schizoaffective disorder  Past Medical History:  Past Medical History:  Diagnosis Date  . Anxiety   . Asthma   . Depression   . GERD (gastroesophageal reflux disease)   . Hallucinations 09/30/2014   Sizoaffective  . Hyperlipidemia   . Hypertension   . Intentional ingestion of batteries 04/21/2016    2 AAA batteries and 1 thumb tack; intent to hurt herself/notes 04/21/2016  . Tardive dyskinesia 10/2014   recent onset    Past Surgical History:  Procedure Laterality Date  . ABDOMINAL SURGERY     "years ago" to remove foreign objects  . APPENDECTOMY    . BREAST EXCISIONAL BIOPSY Right 09/03/2007   surgical bx removed fibroadeoma  . BREAST LUMPECTOMY Right   . COLONOSCOPY WITH PROPOFOL N/A  09/10/2015   Procedure: COLONOSCOPY WITH PROPOFOL;  Surgeon: [name], MD;  Location: [facility] ENDOSCOPY;  Service: Endoscopy;  Laterality: N/A;  . ESOPHAGOGASTRODUODENOSCOPY N/A 11/28/2014   Procedure: ESOPHAGOGASTRODUODENOSCOPY (EGD);  Surgeon: [name], MD;  Location: [facility] ENDOSCOPY;  Service: Endoscopy;  Laterality: N/A;  . ESOPHAGOGASTRODUODENOSCOPY N/A 02/21/2016   Procedure: ESOPHAGOGASTRODUODENOSCOPY (EGD);  Surgeon: Mauri Pole, MD;  Location: [facility] ENDOSCOPY;  Service: Endoscopy;  Laterality: N/A;  . ESOPHAGOGASTRODUODENOSCOPY N/A 04/21/2016   Procedure: ESOPHAGOGASTRODUODENOSCOPY (EGD);  Surgeon: Gatha Mayer, MD;  Location: [facility] ENDOSCOPY;  Service: Endoscopy;  Laterality: N/A;  . ESOPHAGOGASTRODUODENOSCOPY N/A 05/06/2016   Procedure: ESOPHAGOGASTRODUODENOSCOPY (EGD);  Surgeon: Jonathon Bellows, MD;  Location: [facility] ENDOSCOPY;  Service: Gastroenterology;  Laterality: N/A;  . ESOPHAGOGASTRODUODENOSCOPY N/A 06/13/2016   Procedure: ESOPHAGOGASTRODUODENOSCOPY (EGD);  Surgeon: [name], MD;  Location: [facility] ENDOSCOPY;  Service: Endoscopy;  Laterality: N/A;  . ESOPHAGOGASTRODUODENOSCOPY (EGD) WITH PROPOFOL N/A 02/29/2016   Procedure: ESOPHAGOGASTRODUODENOSCOPY (EGD) WITH PROPOFOL;  Surgeon: [name], MD;  Location: [facility] ENDOSCOPY;  Service: Endoscopy;  Laterality: N/A;  . LAPAROTOMY N/A 09/12/2015   Procedure: EXPLORATORY LAPAROTOMY;  Surgeon: Florene Glen, MD;  Location: [facility] ORS;  Service: General;  Laterality: N/A;  . SIGMOIDOSCOPY N/A 09/12/2015   Procedure: Lonell Face;  Surgeon: Florene Glen, MD;  Location: [facility] ORS;  Service: General;  Laterality: N/A;  . WISDOM TOOTH EXTRACTION     Family History:  Family History  Problem Relation Age of Onset  . Depression Mother   . Hypertension Mother   .  Sleep apnea Mother   . Asthma Mother   . COPD Mother   . Diabetes Mother   . Breast cancer Maternal Aunt        20's   Family Psychiatric  History:  depression Social History:  Social History   Substance and Sexual Activity  Alcohol Use No     Social History   Substance and Sexual Activity  Drug Use No    Social History   Socioeconomic History  . Marital status: Single    Spouse name: Not on file  . Number of children: Not on file  . Years of education: Not on file  . Highest education level: Not on file  Occupational History  . Not on file  Social Needs  . Financial resource strain: Not on file  . Food insecurity:    Worry: Not on file    Inability: Not on file  . Transportation needs:    Medical: Not on file    Non-medical: Not on file  Tobacco Use  . Smoking status: Former Smoker    Packs/day: 0.50    Years: 3.00    Pack years: 1.50    Types: Cigarettes    Last attempt to quit: 08/2016    Years since quitting: 1.3  . Smokeless tobacco: Never Used  Substance and Sexual Activity  . Alcohol use: No  . Drug use: No  . Sexual activity: Never    Birth control/protection: Injection  Lifestyle  . Physical activity:    Days per week: Not on file    Minutes per session: Not on file  . Stress: Not on file  Relationships  . Social connections:    Talks on phone: Not on file    Gets together: Not on file    Attends religious service: Not on file    Active member of club or organization: Not on file    Attends meetings of clubs or organizations: Not on file    Relationship status: Not on file  Other Topics Concern  . Not on file  Social History Narrative  . Not on file   Additional Social History:                         Sleep: Fair  Appetite:  Fair  Current Medications: Current Facility-Administered Medications  Medication Dose Route Frequency Provider Last Rate Last Dose  . acetaminophen (TYLENOL) tablet 650 mg  650 mg Oral Q6H PRN [name], [name], MD   650 mg at 12/03/17 2133  . alum & mag hydroxide-simeth (MAALOX/MYLANTA) 200-200-20 MG/5ML suspension 30 mL  30 mL Oral Q4H PRN [name],  [name], MD      . amLODipine (NORVASC) tablet 2.5 mg  2.5 mg Oral Daily [name], [name], MD   2.5 mg at 12/06/17 0901  . cephALEXin (KEFLEX) capsule 500 mg  500 mg Oral Q12H Jaydrian Corpening B, MD   500 mg at 12/06/17 0902  . cetirizine HCl (Zyrtec) 5 MG/5ML solution 10 mg  10 mg Oral BID PRN [name], [name], MD      . cloZAPine (CLOZARIL) tablet 150 mg  150 mg Oral QHS [name], [name], MD   150 mg at 12/05/17 2146  . hydrOXYzine (ATARAX/VISTARIL) tablet 50 mg  50 mg Oral Q6H PRN [name], [name], MD   50 mg at 11/23/17 2333  . levothyroxine (SYNTHROID, LEVOTHROID) tablet 100 mcg  100 mcg Oral QAC breakfast [name], [name], MD  100 mcg at 12/06/17 0901  . lithium carbonate (LITHOBID) CR tablet 300 mg  300 mg Oral BID WC [name], [name], MD   300 mg at 12/06/17 0902  . magnesium hydroxide (MILK OF MAGNESIA) suspension 30 mL  30 mL Oral Daily PRN [name], [name], MD      . neomycin-bacitracin-polymyxin (NEOSPORIN) ointment   Topical Daily Nason Conradt B, MD      . OXcarbazepine (TRILEPTAL) tablet 150 mg  150 mg Oral BID [name], [name], MD   150 mg at 12/06/17 0902  . pantoprazole (PROTONIX) EC tablet 40 mg  40 mg Oral Daily [name], [name], MD   40 mg at 12/06/17 0902  . polyethylene glycol (MIRALAX / GLYCOLAX) packet 17 g  17 g Oral Daily PRN [name], [name], MD   17 g at 12/03/17 2132  . senna (SENOKOT) tablet 8.6 mg  1 tablet Oral Daily PRN [name], [name], MD      . traZODone (DESYREL) tablet 50 mg  50 mg Oral QHS [name], [name], MD   50 mg at 12/05/17 2146  . tuberculin injection 5 Units  5 Units Intradermal Once Durand Wittmeyer B, MD   5 Units at 12/05/17 1733  . venlafaxine XR (EFFEXOR-XR) 24 hr capsule 150 mg  150 mg Oral Daily [name], [name], MD   150 mg at 12/06/17 0902  . [START ON 12/07/2017] Vitamin D (Ergocalciferol) (DRISDOL) capsule 50,000 Units  50,000 Units Oral Weekly [name], [name], MD        Lab Results:  Results for orders placed or performed during the  hospital encounter of 11/23/17 (from the past 48 hour(s))  Urinalysis, Complete w Microscopic     Status: Abnormal   Collection Time: 12/04/17  9:55 PM  Result Value Ref Range   Color, Urine YELLOW (A) YELLOW   APPearance CLEAR (A) CLEAR   Specific Gravity, Urine 1.015 1.005 - 1.030   pH 6.0 5.0 - 8.0   Glucose, UA NEGATIVE NEGATIVE mg/dL   Hgb urine dipstick NEGATIVE NEGATIVE   Bilirubin Urine NEGATIVE NEGATIVE   Ketones, ur NEGATIVE NEGATIVE mg/dL   Protein, ur NEGATIVE NEGATIVE mg/dL   Nitrite NEGATIVE NEGATIVE   Leukocytes, UA NEGATIVE NEGATIVE   WBC, UA 0-5 0 - 5 WBC/hpf   Bacteria, UA NONE SEEN NONE SEEN   Squamous Epithelial / LPF 6-10 0 - 5   Mucus PRESENT     Comment: Performed at [facility], [address] [place], [place] [address]    Blood Alcohol level:  Lab Results  Component Value Date   V [facility] <10 11/23/2017   ETH <5 38/25/0539    Metabolic Disorder Labs: Lab Results  Component Value Date   HGBA1C 5.1 11/23/2017   MPG 99.67 11/23/2017   MPG 103 03/03/2016   Lab Results  Component Value Date   PROLACTIN 64.6 (H) 03/04/2016   PROLACTIN 15.8 06/16/2015   Lab Results  Component Value Date   CHOL 173 11/23/2017   TRIG 77 11/23/2017   HDL 41 11/23/2017   CHOLHDL 4.2 11/23/2017   VLDL 15 11/23/2017   LDLCALC 117 (H) 11/23/2017   LDLCALC 127 (H) 03/03/2016    Physical Findings: AIMS: Facial and Oral Movements Muscles of Facial Expression: None, normal Lips and Perioral Area: None, normal Jaw: None, normal Tongue: None, normal,Extremity Movements Upper (arms, wrists, hands, fingers): None, normal Lower (legs, knees, ankles, toes): None, normal, Trunk Movements Neck, shoulders, hips: None, normal, Overall Severity Severity  of abnormal movements (highest score from questions above): None, normal Incapacitation due to abnormal movements: None, normal Patient's awareness of abnormal movements (rate only patient's report): No Awareness,  Dental Status Current problems with teeth and/or dentures?: No Does patient usually wear dentures?: No  CIWA:    COWS:     Musculoskeletal: Strength & Muscle Tone: within normal limits Gait & Station: normal Patient leans: N/A  Psychiatric Specialty Exam: Physical Exam  Nursing note and vitals reviewed. Psychiatric: She has a normal mood and affect. Her speech is normal and behavior is normal. Thought content normal. Cognition and memory are normal. She expresses impulsivity.    Review of Systems  Neurological: Negative.   Psychiatric/Behavioral: Negative.   All other systems reviewed and are negative.   Blood pressure 112/64, pulse 70, temperature 98.6 F (37 C), resp. rate 18, height 5\' 6"  (1.676 m), weight 97.1 kg (214 lb), SpO2 98 %.Body mass index is 34.54 kg/m.  General Appearance: Casual  Eye Contact:  Good  Speech:  Clear and Coherent  Volume:  Normal  Mood:  Euthymic  Affect:  Appropriate  Thought Process:  Goal Directed and Descriptions of Associations: Intact  Orientation:  Full (Time, Place, and Person)  Thought Content:  WDL  Suicidal Thoughts:  No  Homicidal Thoughts:  No  Memory:  Immediate;   Fair Recent;   Fair Remote;   Fair  Judgement:  Impaired  Insight:  Shallow  Psychomotor Activity:  Normal  Concentration:  Concentration: Fair and Attention Span: Fair  Recall:  [employer] of Knowledge:  Fair  Language:  Fair  Akathisia:  No  Handed:  Right  AIMS (if indicated):     Assets:  Communication Skills Desire for Improvement Financial Resources/Insurance Housing Physical Health Resilience  ADL's:  Intact  Cognition:  WNL  Sleep:  Number of Hours: 7     Treatment Plan Summary: Daily contact with patient to assess and evaluate symptoms and progress in treatment and Medication management   [name] is a 27 year old female with a history of schizoaffective disorder and self injurious behavior admitted after suicidal gesture by cutting her  arm with a steak knife in the context of losing her place at the group home.No medication changes were offered. At the time of discharge, the patient is no longer suicidal. She is able to contract for safety. She is forward thinking and optimistic about the future.  Unable to discharge for lack of contact with the guardian.  #Mood/psychosis -continue Clozapine 150 mg nightly -her dose should be increased as Clo/Nclo level is low 314 on 7/11 but we are unable to do so without guardian's permission -continue Lithium 300 mg BID, Li0.54 -Trileptal 150 mg BID -Effexor 150 mg daily  #HTN -continueLisinopril 2.5 mg daily  #Hypothyroidism -Synthroid 100 ug daily  #GERD -Protonix 40 mg daily  #Constipation -bowel regimen  #Skin cuts, healed  #UTI, resolved -stop Keflex -start Nitrofurantoin 100 mg BID  #Social -incompetent adult -mother is the guardian  #Labs -lipid panel, TSH and [facility]Care normal -EKGreviewed, NSR with QTc 437 -pregnancy test is negative  #Disposition -discharge to a new group home -[facility]    Orson Slick, MD 12/06/2017, 12:43 PM

## 2017-12-05 NOTE — BHH Group Notes (Signed)
12/05/2017 1PM  Type of Therapy/Topic:  Group Therapy:  Feelings about Diagnosis  Participation Level:  Active   Description of Group:   This group will allow patients to explore their thoughts and feelings about diagnoses they have received. Patients will be guided to explore their level of understanding and acceptance of these diagnoses. Facilitator will encourage patients to process their thoughts and feelings about the reactions of others to their diagnosis and will guide patients in identifying ways to discuss their diagnosis with significant others in their lives. This group will be process-oriented, with patients participating in exploration of their own experiences, giving and receiving support, and processing challenge from other group members.   Therapeutic Goals: 1. Patient will demonstrate understanding of diagnosis as evidenced by identifying two or more symptoms of the disorder 2. Patient will be able to express two feelings regarding the diagnosis 3. Patient will demonstrate their ability to communicate their needs through discussion and/or role play  Summary of Patient Progress: Actively and appropriately engaged in the group. Patient was able to provide support and validation to other group members.Patient practiced active listening when interacting with the facilitator and other group members. Malita says "I am very impulsive. I don't think before I act. I do take the blame for my behavior. I need to learn how to think before I act." Patient is still in the process of obtaining treatment goals.        Therapeutic Modalities:   Cognitive Behavioral Therapy Brief Therapy Feelings Identification    Darin Engels, Marlinda Mike 12/05/2017 2:44 PM

## 2017-12-05 NOTE — Plan of Care (Signed)
Bright mood and affect, peer to peer interaction appropriate, denied SI/HI/AVH; hopeful, assertive, demonstrated knowledge of medication, complied with treatments, UA obtained, see the result as posted.  Results for Melanie Bell, Melanie Bell (MRN [id number]) as of 12/05/2017 03:22  Ref. Range 12/04/2017 21:55  Appearance Latest Ref Range: CLEAR  CLEAR (A)  Bilirubin Urine Latest Ref Range: NEGATIVE  NEGATIVE  Color, Urine Latest Ref Range: YELLOW  YELLOW (A)  Glucose Latest Ref Range: NEGATIVE mg/dL NEGATIVE  Hgb urine dipstick Latest Ref Range: NEGATIVE  NEGATIVE  Ketones, ur Latest Ref Range: NEGATIVE mg/dL NEGATIVE  Leukocytes, UA Latest Ref Range: NEGATIVE  NEGATIVE  Nitrite Latest Ref Range: NEGATIVE  NEGATIVE  pH Latest Ref Range: 5.0 - 8.0  6.0  Protein Latest Ref Range: NEGATIVE mg/dL NEGATIVE  Specific Gravity, Urine Latest Ref Range: 1.005 - 1.030  1.015  Bacteria, UA Latest Ref Range: NONE SEEN  NONE SEEN  Mucus Unknown PRESENT  Squamous Epithelial / LPF Latest Ref Range: 0 - 5  6-10  WBC, UA Latest Ref Range: 0 - 5 WBC/hpf 0-5   Patient slept for Estimated Hours of 6; Precautionary checks every 15 minutes for safety maintained, room free of safety hazards, patient sustains no injury or falls during this shift.  Problem: Activity: Goal: Interest or engagement in leisure activities will improve Outcome: Progressing   Problem: Coping: Goal: Coping ability will improve Outcome: Progressing Goal: Will verbalize feelings Outcome: Progressing   Problem: Health Behavior/Discharge Planning: Goal: Compliance with therapeutic regimen will improve Outcome: Progressing   Problem: Self-Concept: Goal: Will verbalize positive feelings about self Outcome: Progressing

## 2017-12-05 NOTE — BHH Group Notes (Signed)
[facility] Group Notes:  (Nursing/MHT/Case Management/Adjunct)  Date:  12/05/2017  Time:  10:59 PM  Type of Therapy:  Group Therapy  Participation Level:  Active  Participation Quality:  Appropriate  Affect:  Appropriate  Cognitive:  Appropriate  Insight:  Appropriate  Engagement in Group:  Engaged  Modes of Intervention:  Discussion  Summary of Progress/Problems: Nuria reported she accomplished her goals on this day. Kayleann stated her goal was stay out of bed and attend all groups. Seanne stated she was ready to meet with a new group home provider. Sumiko stated she did not do everything wrong at the previous home, but admitted to causing some problems while there. Emillee is looking forward to a new start at a new group home. MHT reviewed rules and expectations of unit. MHT encouraged patients to clean up after themselves. MHT informed patients not to take food back to the rooms. MHT informed patients of visitation and phone hours. MHT informed patients of vitals at 6am and encouraged everyone to get a good night sleep. MHT informed patients that techs would be looking in rooms routinely to perform checks and for everyone to keep covered up throughout the night. MHT covered topic of preventing and managing stress. MHT discussed identifying stressors in patient's life. MHT discussed the importance of relaxing and taking time for self. MHT discussed self-awareness and preparing for the things that cause stress. MHT provided examples of how to deal with the stressors of hearing voices. MHT discussed taking medications as prescribed. Group discussion on strategies to help with hearing voices. Things to try brought up in group were using head phones, using ear buds, cotton swabs, exercising, and talking to doctor if medications were not effective. MHT discussed the signs of stress and informed of burn out.  [name] 12/05/2017, 10:59 PM

## 2017-12-05 NOTE — Progress Notes (Signed)
Patient ID: Melanie Bell, female   DOB: May 08, 1991, 27 y.o.   MRN: [id number] CSW has left voicemails for the following Group Homes:  [facility], [facility], [facility].  Sent FL-2 to [facility] and [facility] who confirmed having female bed available.  Will ask CSW to follow up on this tomorrow.  Dossie Arbour, LCSW

## 2017-12-05 NOTE — Progress Notes (Signed)
Patient ID: Melanie Bell, female   DOB: Feb 13, 1991, 27 y.o.   MRN: [id number] CSW spoke with [facility] who is willing to consider Pt for his group home. He will be meeting her on Thursday around 9/9:30am.  Bed is available immediately if she appears to be a good fit.  Notified ACTT team of potential placement and informed Pt.  CSW spoke with Operator # [id number] with [employer] ([phone]) who agreed to send officer out to the last known address of Pt's guardian, which is [name], [name]. They will ask her to call directly or will leave her a note on the door to call ASAP.   Will ask for other team members to follow up on this tomorrow if no return call today.  Milta Deiters

## 2017-12-05 NOTE — B[facility] Group Notes (Signed)
[facility] Group Notes:  (Nursing/MHT/Case Management/Adjunct)  Date:  12/05/2017  Time:  2:51 PM  Type of Therapy:  Psychoeducational Skills  Participation Level:  Did Not Attend   Adela Lank [facility] 12/05/2017, 2:51 PM

## 2017-12-05 NOTE — NC FL2 (Signed)
[place] LEVEL OF CARE SCREENING TOOL     IDENTIFICATION  Patient [name] Birthdate: 04-Aug-1990 Sex: female Admission Date (Current Location): 11/23/2017  [place] and [place] Number:    [facility] [id number] [place] and Address:  [facility], [address], [place], [place] [address]      Provider Number: [id number]  Attending Physician Name and Address:  [name], MD  Relative Name and Phone Number:  [name] Mother and Guardian     Current Level of Care: Hospital Recommended Level of Care: Other (Comment)(Group Home) Prior Approval Number:    Date Approved/Denied:   PASRR Number:    Discharge Plan: (Group Home)    Current Diagnoses: Patient Active Problem List   Diagnosis Date Noted  . Self-inflicted injury 79/89/2119  . ASCUS of cervix with negative high risk HPV 08/28/2017  . Malingering 05/06/2016  . Foreign body aspiration   . Other foreign object in esophagus causing other injury, subsequent encounter   . Swallowed foreign body 04/23/2016  . Foreign body ingestion 04/21/2016  . Gastric foreign body   . Breast tumor 03/05/2016  . Schizoaffective disorder, bipolar type ([place]) 11/06/2014  . Tobacco use disorder 09/30/2014  . Hypothyroidism 09/29/2014  . Hypertension 09/29/2014    Orientation RESPIRATION BLADDER Height & Weight     Self, Time, Situation, Place  Normal Continent Weight: 214 lb (97.1 kg) Height:  5\' 6"  (167.6 cm)  BEHAVIORAL SYMPTOMS/MOOD NEUROLOGICAL BOWEL NUTRITION STATUS  Other (Comment)(history of swallowing foriegn objects and some self-injurious behavior)   Continent    AMBULATORY STATUS COMMUNICATION OF NEEDS Skin   Independent Verbally Normal                       Personal Care Assistance Level of Assistance  Bathing, Feeding, Dressing Bathing Assistance: Independent Feeding assistance: Independent Dressing Assistance: Independent     Functional  Limitations Info  Sight, Hearing, Speech Sight Info: Adequate Hearing Info: Adequate Speech Info: Adequate    SPECIAL CARE FACTORS FREQUENCY                       Contractures Contractures Info: Not present    Additional Factors Info                  Current Medications (12/05/2017):  This is the current hospital active medication list Current Facility-Administered Medications  Medication Dose Route Frequency Provider Last Rate Last Dose  . acetaminophen (TYLENOL) tablet 650 mg  650 mg Oral Q6H PRN [name], [name], MD   650 mg at 12/03/17 2133  . alum & mag hydroxide-simeth (MAALOX/MYLANTA) 200-200-20 MG/5ML suspension 30 mL  30 mL Oral Q4H PRN [name], [name], MD      . amLODipine (NORVASC) tablet 2.5 mg  2.5 mg Oral Daily [name], [name], MD   2.5 mg at 12/05/17 0802  . cephALEXin (KEFLEX) capsule 500 mg  500 mg Oral Q12H [name], [name], MD   500 mg at 12/05/17 0802  . cetirizine HCl (Zyrtec) 5 MG/5ML solution 10 mg  10 mg Oral BID PRN [name], [name], MD      . cloZAPine (CLOZARIL) tablet 150 mg  150 mg Oral QHS [name], [name], MD   150 mg at 12/04/17 2113  . hydrOXYzine (ATARAX/VISTARIL) tablet 50 mg  50 mg Oral Q6H PRN [name], [name], MD   50 mg at 11/23/17 2333  . levothyroxine (SYNTHROID, LEVOTHROID)  tablet 100 mcg  100 mcg Oral QAC breakfast [name], [name], MD   100 mcg at 12/05/17 0805  . lithium carbonate (LITHOBID) CR tablet 300 mg  300 mg Oral BID WC [name], [name], MD   300 mg at 12/05/17 0806  . magnesium hydroxide (MILK OF MAGNESIA) suspension 30 mL  30 mL Oral Daily PRN [name], [name], MD      . neomycin-bacitracin-polymyxin (NEOSPORIN) ointment   Topical Daily [name], [name], MD      . OXcarbazepine (TRILEPTAL) tablet 150 mg  150 mg Oral BID [name], [name], MD   150 mg at 12/05/17 0802  . pantoprazole (PROTONIX) EC tablet 40 mg  40 mg Oral Daily [name], [name], MD   40 mg at 12/05/17 0802  . polyethylene glycol (MIRALAX / GLYCOLAX)  packet 17 g  17 g Oral Daily PRN [name], [name], MD   17 g at 12/03/17 2132  . senna (SENOKOT) tablet 8.6 mg  1 tablet Oral Daily PRN [name], [name], MD      . traZODone (DESYREL) tablet 50 mg  50 mg Oral QHS [name], [name], MD   50 mg at 12/04/17 2113  . venlafaxine XR (EFFEXOR-XR) 24 hr capsule 150 mg  150 mg Oral Daily [name], [name], MD   150 mg at 12/05/17 0802  . [START ON 12/07/2017] Vitamin D (Ergocalciferol) (DRISDOL) capsule 50,000 Units  50,000 Units Oral Weekly [name], [name], MD         Discharge Medications: Please see discharge summary for a list of discharge medications.  Relevant Imaging Results:  Relevant Lab Results:   Additional Information Allergies: Betadine, Shell fish derived products Iodine  SSN # [id number]  [name], LCSW

## 2017-12-05 NOTE — Plan of Care (Signed)
Patient is alert and oriented X 4, denies SI, HI and AVH. Patient is pleasant and appropriate. Denies any pain this morning. Patient is bathing; participating in groups today and eating meals regularly.Patient has no concerns to address understands staff is working on placement. Nurse will continue to monitor. Problem: Activity: Goal: Interest or engagement in leisure activities will improve Outcome: Progressing Goal: Imbalance in normal sleep/wake cycle will improve Outcome: Progressing   Problem: Coping: Goal: Coping ability will improve Outcome: Progressing Goal: Will verbalize feelings Outcome: Progressing   Problem: Health Behavior/Discharge Planning: Goal: Ability to make decisions will improve Outcome: Progressing Goal: Compliance with therapeutic regimen will improve Outcome: Progressing   Problem: Safety: Goal: Ability to disclose and discuss suicidal ideas will improve Outcome: Progressing Goal: Ability to identify and utilize support systems that promote safety will improve Outcome: Progressing

## 2017-12-05 NOTE — Tx Team (Signed)
Interdisciplinary Treatment and Diagnostic Plan Update  12/04/2017 Time of Session: 10:30am Melanie Bell MRN: [id number]  Principal Diagnosis: Schizoaffective disorder, bipolar type [facility])  Secondary Diagnoses: Principal Problem:   Schizoaffective disorder, bipolar type ([place]) Active Problems:   Hypothyroidism   Hypertension   Self-inflicted injury   Current Medications:  Current Facility-Administered Medications  Medication Dose Route Frequency Provider Last Rate Last Dose  . acetaminophen (TYLENOL) tablet 650 mg  650 mg Oral Q6H PRN [name], [name], MD   650 mg at 12/03/17 2133  . alum & mag hydroxide-simeth (MAALOX/MYLANTA) 200-200-20 MG/5ML suspension 30 mL  30 mL Oral Q4H PRN [name], [name], MD      . amLODipine (NORVASC) tablet 2.5 mg  2.5 mg Oral Daily [name], [name], MD   2.5 mg at 12/05/17 0802  . cephALEXin (KEFLEX) capsule 500 mg  500 mg Oral Q12H [name], [name], MD   500 mg at 12/05/17 0802  . cetirizine HCl (Zyrtec) 5 MG/5ML solution 10 mg  10 mg Oral BID PRN [name], [name], MD      . cloZAPine (CLOZARIL) tablet 150 mg  150 mg Oral QHS [name], [name], MD   150 mg at 12/04/17 2113  . hydrOXYzine (ATARAX/VISTARIL) tablet 50 mg  50 mg Oral Q6H PRN [name], [name], MD   50 mg at 11/23/17 2333  . levothyroxine (SYNTHROID, LEVOTHROID) tablet 100 mcg  100 mcg Oral QAC breakfast [name], [name], MD   100 mcg at 12/05/17 0805  . lithium carbonate (LITHOBID) CR tablet 300 mg  300 mg Oral BID WC [name], [name], MD   300 mg at 12/05/17 0806  . magnesium hydroxide (MILK OF MAGNESIA) suspension 30 mL  30 mL Oral Daily PRN [name], [name], MD      . neomycin-bacitracin-polymyxin (NEOSPORIN) ointment   Topical Daily [name], [name], MD      . OXcarbazepine (TRILEPTAL) tablet 150 mg  150 mg Oral BID [name], [name], MD   150 mg at 12/05/17 0802  . pantoprazole (PROTONIX) EC tablet 40 mg  40 mg Oral Daily [name], [name], MD   40 mg at 12/05/17 0802  . polyethylene  glycol (MIRALAX / GLYCOLAX) packet 17 g  17 g Oral Daily PRN [name], [name], MD   17 g at 12/03/17 2132  . senna (SENOKOT) tablet 8.6 mg  1 tablet Oral Daily PRN [name], [name], MD      . traZODone (DESYREL) tablet 50 mg  50 mg Oral QHS [name], [name], MD   50 mg at 12/04/17 2113  . venlafaxine XR (EFFEXOR-XR) 24 hr capsule 150 mg  150 mg Oral Daily [name], [name], MD   150 mg at 12/05/17 0802  . [START ON 12/07/2017] Vitamin D (Ergocalciferol) (DRISDOL) capsule 50,000 Units  50,000 Units Oral Weekly [name], [name], MD       PTA Medications: Medications Prior to Admission  Medication Sig Dispense Refill Last Dose  . amLODipine (NORVASC) 2.5 MG tablet Take 1 tablet (2.5 mg total) by mouth daily. 30 tablet 0 11/22/2017 at Unknown time  . cetirizine (ZYRTEC) 10 MG tablet Take 10 mg by mouth daily as needed for allergies.   prn at prn  . clozapine (CLOZARIL) 50 MG tablet Take 150 mg by mouth.   11/22/2017 at Unknown time  . hydrOXYzine (ATARAX/VISTARIL) 50 MG tablet Take 50 mg by mouth 2 (two) times daily as needed.   prn at prn  . levothyroxine (  SYNTHROID, LEVOTHROID) 100 MCG tablet Take 100 mcg by mouth daily before breakfast.   11/22/2017 at Unknown time  . lithium carbonate 300 MG capsule Take 300 mg by mouth 2 (two) times daily with a meal.    11/22/2017 at Unknown time  . medroxyPROGESTERone (DEPO-PROVERA) 150 MG/ML injection Inject 150 mg into the muscle every 3 (three) months.   unknowbn at unknown  . omeprazole (PRILOSEC) 20 MG capsule Take 20 mg by mouth daily.   11/22/2017 at Unknown time  . OXcarbazepine (TRILEPTAL) 150 MG tablet Take 1 tablet (150 mg total) by mouth 2 (two) times daily. (Patient taking differently: Take 300 mg by mouth 2 (two) times daily. ) 60 tablet 0 11/22/2017 at Unknown time  . polyethylene glycol (MIRALAX / GLYCOLAX) packet Take 17 g by mouth daily as needed.   prn at prn  . senna (SENOKOT) 8.6 MG TABS tablet Take 1 tablet by mouth daily as needed for mild  constipation.   prn at prn  . traZODone (DESYREL) 50 MG tablet Take 50 mg by mouth at bedtime.   prn at prn  . venlafaxine XR (EFFEXOR-XR) 150 MG 24 hr capsule Take 1 capsule (150 mg total) by mouth daily with breakfast. 30 capsule 0 11/22/2017 at Unknown time  . Vitamin D, Ergocalciferol, (DRISDOL) 50000 units CAPS capsule Take 50,000 Units by mouth every 7 (seven) days.   Past Week at Unknown time    Patient Stressors: Traumatic event Other: Uncontrolled Anger Displacement aeb Self Inflicting Wounds  Patient Strengths: Ability for insight Motivation for treatment/growth  Treatment Modalities: Medication Management, Group therapy, Case management,  1 to 1 session with clinician, Psychoeducation, Recreational therapy.   Physician Treatment Plan for Primary Diagnosis: Schizoaffective disorder, bipolar type ([place]) Long Term Goal(s): Improvement in symptoms so as ready for discharge Improvement in symptoms so as ready for discharge   Short Term Goals: Ability to identify changes in lifestyle to reduce recurrence of condition will improve Ability to verbalize feelings will improve Ability to disclose and discuss suicidal ideas Ability to demonstrate self-control will improve Ability to identify and develop effective coping behaviors will improve Ability to identify triggers associated with substance abuse/mental health issues will improve Ability to identify changes in lifestyle to reduce recurrence of condition will improve Ability to demonstrate self-control will improve Ability to identify triggers associated with substance abuse/mental health issues will improve  Medication Management: Evaluate patient's response, side effects, and tolerance of medication regimen.  Therapeutic Interventions: 1 to 1 sessions, Unit Group sessions and Medication administration.  Evaluation of Outcomes: Progressing  Physician Treatment Plan for Secondary Diagnosis: Principal Problem:   Schizoaffective  disorder, bipolar type ([place]) Active Problems:   Hypothyroidism   Hypertension   Self-inflicted injury  Long Term Goal(s): Improvement in symptoms so as ready for discharge Improvement in symptoms so as ready for discharge   Short Term Goals: Ability to identify changes in lifestyle to reduce recurrence of condition will improve Ability to verbalize feelings will improve Ability to disclose and discuss suicidal ideas Ability to demonstrate self-control will improve Ability to identify and develop effective coping behaviors will improve Ability to identify triggers associated with substance abuse/mental health issues will improve Ability to identify changes in lifestyle to reduce recurrence of condition will improve Ability to demonstrate self-control will improve Ability to identify triggers associated with substance abuse/mental health issues will improve     Medication Management: Evaluate patient's response, side effects, and tolerance of medication regimen.  Therapeutic Interventions: 1 to  1 sessions, Unit Group sessions and Medication administration.  Evaluation of Outcomes: Progressing   RN Treatment Plan for Primary Diagnosis: Schizoaffective disorder, bipolar type ([place]) Long Term Goal(s): Knowledge of disease and therapeutic regimen to maintain health will improve  Short Term Goals: Ability to demonstrate self-control, Ability to disclose and discuss suicidal ideas, Ability to identify and develop effective coping behaviors will improve and Compliance with prescribed medications will improve  Medication Management: RN will administer medications as ordered by provider, will assess and evaluate patient's response and provide education to patient for prescribed medication. RN will report any adverse and/or side effects to prescribing provider.  Therapeutic Interventions: 1 on 1 counseling sessions, Psychoeducation, Medication administration, Evaluate responses to treatment,  Monitor vital signs and CBGs as ordered, Perform/monitor CIWA, COWS, AIMS and Fall Risk screenings as ordered, Perform wound care treatments as ordered.  Evaluation of Outcomes: Progressing   LCSW Treatment Plan for Primary Diagnosis: Schizoaffective disorder, bipolar type ([place]) Long Term Goal(s): Safe transition to appropriate next level of care at discharge, Engage patient in therapeutic group addressing interpersonal concerns.  Short Term Goals: Engage patient in aftercare planning with referrals and resources, Increase social support, Increase emotional regulation, Identify triggers associated with mental health/substance abuse issues and Increase skills for wellness and recovery  Therapeutic Interventions: Assess for all discharge needs, 1 to 1 time with Social worker, Explore available resources and support systems, Assess for adequacy in community support network, Educate family and significant other(s) on suicide prevention, Complete Psychosocial Assessment, Interpersonal group therapy.  Evaluation of Outcomes: Progressing   Progress in Treatment: Attending groups: No. Participating in groups: No. Taking medication as prescribed: Yes. Toleration medication: Yes. Family/Significant other contact made: No, will contact:  Pts mother  Patient understands diagnosis: Yes. Discussing patient identified problems/goals with staff: Yes. Medical problems stabilized or resolved: Yes. Denies suicidal/homicidal ideation: Yes. Issues/concerns per patient self-inventory: No. Other:   New problem(s) identified: No, Describe:  None  New Short Term/Long Term Goal(s): "Start thinking before I act and control my anger."  Patient Goals:  "Start thinking before I act and control my anger."  Discharge Plan or Barriers: Will need new group home placement due to her behaviors.  She will follow up with [facility].  Reason for Continuation of Hospitalization: Depression Medication  stabilization  Estimated Length of Stay: 3-5 days  Recreational Therapy: Patient Stressors: The group home staff  Patient Goal: Patient will successfully identify 2 ways of making healthy decisions post d/c within 5 recreation therapy group sessions  Attendees: Patient: Melanie Bell 12/04/2017 9:35 AM  Physician: Dr. Bary Leriche, MD 12/04/2017 9:35 AM  Nursing: Polly Cobia, RN  7/222/2019 9:35 AM  RN Care Manager: 12/04/2017 9:35 AM  Social Worker:Von Quintanar, LCSW 12/04/2017 9:35 AM  Recreational Therapist: Isaias Sakai. Marcello Fennel, LRT 12/04/2017 9:35 AM  Other: Alden Hipp, LCSW 12/04/2017 9:35 AM  Other: Darin Engels,  LCSWA 12/04/2017 9:35 AM  Other: 12/04/2017 9:35 AM    Scribe for Treatment Team: [name], LCSW 12/05/2017 9:35 AM

## 2017-12-06 MED ORDER — NITROFURANTOIN MONOHYD MACRO 100 MG PO CAPS
100.0000 mg | ORAL_CAPSULE | Freq: Two times a day (BID) | ORAL | Status: DC
  Administered 2017-12-06 – 2017-12-11 (×11): 100 mg via ORAL
  Filled 2017-12-06 (×11): qty 1

## 2017-12-06 MED ORDER — NITROFURANTOIN MONOHYD MACRO 100 MG PO CAPS: 100.0000 mg | ORAL_CAPSULE | Freq: Two times a day (BID) | ORAL | 0 refills | Status: DC

## 2017-12-06 NOTE — Progress Notes (Signed)
Patient ID: Melanie Bell, female   DOB: 1990/09/13, 27 y.o.   MRN: [id number] CSW followed up with pt's mother/guardian, [name], after given new telephone number by pt's ACT TL, Eligha Bridegroom [phone]).  Ms. Rolena Infante apologized for that fact that no one from the hospital has been able to get in touch with her.  CSW informed Ms. Rolena Infante that pt was not going to be allowed to return to the group home that she was in prior to coming to the hospital, but another group home had been identified and CSW needed to submit information to the owner of the group home.  CSW informed Ms. Rolena Infante that the owner, Seward Carol of the new group home [facility]) would be coming to meet with pt on Thursday, July 25th around 9:00AM.  Ms. Rolena Infante informed CSW that consents could be sent to her via her e-mail at [email or web address].    CSW securely emailed Ms. Brooks consents for [facility] and [facility].  CSW also asked Ms. Rolena Infante if available to send a copy of the guardianship paperwork.  CSW received the signed consents and guardianship paperwork back from Ms. Brooks as requested.

## 2017-12-06 NOTE — [facility] Group Notes (Signed)
[place] Group Notes:  (Nursing/MHT/Case Management/Adjunct)  Date:  12/06/2017  Time:  6:33 PM  Type of Therapy:  Psychoeducational Skills  Participation Level:  Active  Participation Quality:  Appropriate  Affect:  Appropriate  Cognitive:  Appropriate  Insight:  Appropriate  Engagement in Group:  Engaged  Modes of Intervention:  Socialization  Summary of Progress/Problems:  Melanie Bell 12/06/2017, 6:33 PM

## 2017-12-06 NOTE — Plan of Care (Signed)
Denies SI/HI/AVH.  Pleasant and cooperative.  Medication compliant and attending groups.  Up to dayroom with peers coloring.  Good appetite.  Maintains personal care chores appropriately.  Support offered.  Safety maintained.

## 2017-12-06 NOTE — Progress Notes (Signed)
Recreation Therapy Notes  Date: 12/06/2017  Time: 9:30 am   Location: Craft Room   Behavioral response: N/A   Intervention Topic: Communication  Discussion/Intervention: Patient did not attend group.   Clinical Observations/Feedback:  Patient did not attend group.   Ruvim Risko LRT/CTRS         Andersyn Fragoso 12/06/2017 11:50 AM

## 2017-12-06 NOTE — BHH Group Notes (Signed)
LCSW Group Therapy Note  12/06/2017 1:00 pm  Type of Therapy/Topic:  Group Therapy:  Emotion Regulation  Participation Level:  Active   Description of Group:    The purpose of this group is to assist patients in learning to regulate negative emotions and experience positive emotions. Patients will be guided to discuss ways in which they have been vulnerable to their negative emotions. These vulnerabilities will be juxtaposed with experiences of positive emotions or situations, and patients will be challenged to use positive emotions to combat negative ones. Special emphasis will be placed on coping with negative emotions in conflict situations, and patients will process healthy conflict resolution skills.  Therapeutic Goals: 1. Patient will identify two positive emotions or experiences to reflect on in order to balance out negative emotions 2. Patient will label two or more emotions that they find the most difficult to experience 3. Patient will demonstrate positive conflict resolution skills through discussion and/or role plays  Summary of Patient Progress:  Marcha actively participated in today's group discussion on emotion regulation.  Sterling shared that she has experienced love and hopeful, but also often experiences feelings of being judged and criticized, which she finds most difficult to experience.  Winta shared that she often tried to use exercise and journaling to help her to balance out her negative emotions.     Therapeutic Modalities:   Cognitive Behavioral Therapy Feelings Identification Dialectical Behavioral Therapy

## 2017-12-06 NOTE — Plan of Care (Signed)
Patient is comfortable did not voice any issues, resting in bed , denies SI/HI/AVH 15 minute safety rounding in progress.

## 2017-12-07 LAB — CBC WITH DIFFERENTIAL/PLATELET
BASOS ABS: 0.1 10*3/uL (ref 0–0.1)
BASOS PCT: 1 %
EOS ABS: 0.2 10*3/uL (ref 0–0.7)
Eosinophils Relative: 3 %
HEMATOCRIT: 36.4 % (ref 35.0–47.0)
HEMOGLOBIN: 12.5 g/dL (ref 12.0–16.0)
Lymphocytes Relative: 26 %
Lymphs Abs: 1.8 10*3/uL (ref 1.0–3.6)
MCH: 31 pg (ref 26.0–34.0)
MCHC: 34.3 g/dL (ref 32.0–36.0)
MCV: 90.6 fL (ref 80.0–100.0)
MONOS PCT: 8 %
Monocytes Absolute: 0.6 10*3/uL (ref 0.2–0.9)
NEUTROS ABS: 4.4 10*3/uL (ref 1.4–6.5)
NEUTROS PCT: 62 %
Platelets: 215 10*3/uL (ref 150–440)
RBC: 4.02 MIL/uL (ref 3.80–5.20)
RDW: 13.6 % (ref 11.5–14.5)
WBC: 7.1 10*3/uL (ref 3.6–11.0)

## 2017-12-07 NOTE — Plan of Care (Signed)
Pt continues to progress towards goals and d/c. RN will continue to monitor.  Problem: Activity: Goal: Interest or engagement in leisure activities will improve Outcome: Progressing Goal: Imbalance in normal sleep/wake cycle will improve Outcome: Progressing   Problem: Coping: Goal: Coping ability will improve Outcome: Progressing Goal: Will verbalize feelings Outcome: Progressing   Problem: Health Behavior/Discharge Planning: Goal: Ability to make decisions will improve Outcome: Progressing Goal: Compliance with therapeutic regimen will improve Outcome: Progressing   Problem: Safety: Goal: Ability to disclose and discuss suicidal ideas will improve Outcome: Progressing Goal: Ability to identify and utilize support systems that promote safety will improve Outcome: Progressing   Problem: Self-Concept: Goal: Will verbalize positive feelings about self Outcome: Progressing Goal: Level of anxiety will decrease Outcome: Progressing

## 2017-12-07 NOTE — Progress Notes (Signed)
[facility] [place] for clozapine Indication: ANC monitoring  Allergies  Allergen Reactions  . Betadine [Povidone Iodine] Other (See Comments)    Reaction:  Unknown   . Iodine Rash  . Shellfish-Derived Products Other (See Comments)    Reaction:  Unknown     Patient Measurements: Height: 5\' 6"  (167.6 cm) Weight: 214 lb (97.1 kg) IBW/kg (Calculated) : 59.3   Vital Signs: Temp: 98.5 F (36.9 C) (07/25 0638) Temp Source: Oral (07/25 4469) BP: 120/79 (07/25 5072) Pulse Rate: 78 (07/25 2575) Intake/Output from previous day: 07/24 0701 - 07/25 0700 In: 960 [P.O.:960] Out: -  Intake/Output from this shift: No intake/output data recorded.  Labs: Recent Labs    12/07/17 0833  WBC 7.1  HGB 12.5  HCT 36.4  PLT 215   Estimated Creatinine Clearance: 123 mL/min (by C-G formula based on SCr of 0.64 mg/dL).   Assessment: 27 yo female on clozapine 150 mg PO qhs currently  7/11  [place] 5500 7/18  ANC 4500 7/25  ANC 4400  Goal of Therapy:  ANC within goal range  Plan:  >>** NOTE that ZIP CODE is different for REMS program: [address] not [address] >>>>>> Lab reported to clozapine REMS registry Pt eligible for dispensing Next labs in one week on 8/01  Pharmacy will continue to follow  Dietrich Ke K, [place] 12/07/2017,9:40 AM

## 2017-12-07 NOTE — [facility] Group Notes (Signed)
  12/07/2017  Time: 1PM  Type of Therapy/Topic:  Group Therapy:  Balance in Life  Participation Level:  Active  Description of Group:   This group will address the concept of balance and how it feels and looks when one is unbalanced. Patients will be encouraged to process areas in their lives that are out of balance and identify reasons for remaining unbalanced. Facilitators will guide patients in utilizing problem-solving interventions to address and correct the stressor making their life unbalanced. Understanding and applying boundaries will be explored and addressed for obtaining and maintaining a balanced life. Patients will be encouraged to explore ways to assertively make their unbalanced needs known to significant others in their lives, using other group members and facilitator for support and feedback.  Therapeutic Goals: 1. Patient will identify two or more emotions or situations they have that consume much of in their lives. 2. Patient will identify signs/triggers that life has become out of balance:  3. Patient will identify two ways to set boundaries in order to achieve balance in their lives:  4. Patient will demonstrate ability to communicate their needs through discussion and/or role plays  Summary of Patient Progress: Pt continues to work towards their tx goals but has not yet reached them. Pt was able to appropriately participate in group discussion, and was able to offer support/validation to other group members. Pt reported one area of her life she would like to spend more time on is, "controlling my anger and my impulses." Pt reported one area of her life she would like to devote less attention to is, "my past and things I cannot change."    Therapeutic Modalities:   Cognitive Behavioral Therapy Solution-Focused Therapy Assertiveness Training  Alden Hipp, MSW, LCSW Clinical Social Worker 12/07/2017 1:55 PM

## 2017-12-07 NOTE — Progress Notes (Signed)
Recreation Therapy Notes   Date: 12/07/2017  Time: 9:30 am  Location: Craft Room  Behavioral response: Appropriate    Intervention Topic: Happiness  Discussion/Intervention:  Group content today was focused on Happiness. The group defined happiness and stated reasons they are and are not happy at times. Participants identified reasons they are normally happy and why. Individuals expressed how not being happy affects themselves and others. Patients stated reasons why happiness is important to them. The group described how they feel when they are happy. Individuals participated in the intervention "What is happiness" where they defined what happiness means to them.  Clinical Observations/Feedback:  Patient came to group and defined happiness as pure joy. She identified thing that make her happy is her higher power and helping others. Participant identified others will know she is happy because she laughs when she is happy. Individual stated loving herself and her attitude make up her happiness. Patient was social with peers and staff while participating in group.  Talha Iser LRT/CTRS         Simran Bomkamp 12/07/2017 11:08 AM

## 2017-12-07 NOTE — Plan of Care (Signed)
Patient is stable alert and attending groups and socializing with peers, patient verbalize positive feeling about self , and cooperating with her medical regimen, denies any SI/HI/AVH, resting comfortably in bed, 15 minute safety rounding is maintained.  Problem: Activity: Goal: Interest or engagement in leisure activities will improve Outcome: Progressing Goal: Imbalance in normal sleep/wake cycle will improve Outcome: Progressing   Problem: Coping: Goal: Coping ability will improve Outcome: Progressing Goal: Will verbalize feelings Outcome: Progressing   Problem: Health Behavior/Discharge Planning: Goal: Ability to make decisions will improve Outcome: Progressing Goal: Compliance with therapeutic regimen will improve Outcome: Progressing   Problem: Safety: Goal: Ability to disclose and discuss suicidal ideas will improve Outcome: Progressing Goal: Ability to identify and utilize support systems that promote safety will improve Outcome: Progressing   Problem: Self-Concept: Goal: Will verbalize positive feelings about self Outcome: Progressing Goal: Level of anxiety will decrease Outcome: Progressing

## 2017-12-07 NOTE — Progress Notes (Addendum)
Patient ID: Melanie Bell, female   DOB: [birth year], 27 y.o.   MRN: [id number] CSW followed up with Seward Carol with [facility] regarding his acceptance of pt into his group home.  Mr. Reece Agar met with pt earlier in the morning.  Mr. Reece Agar informed CSW that he would accept pt into his group home, but the earliest that he would be able to admit her would be Monday, December 11, 2017.  Mr. Reece Agar shared that he does not like admitted new residents into his homes on the weekends.  Mr. Reece Agar shared with CSW that he would like to pick pt up around 9:00 AM on July 29th, if possible.  CSW informed pt's attending physician, Dr. Bary Leriche, MD of pt's acceptance into the group home and when Mr. Humphries would like to admit her.  CSW also informed [facility] ACTT leader, Eligha Bridegroom, of where and the date that pt would be admitted into the group home. Ms. Jimmye Norman informed CSW that the team would be meeting with pt later on the date of pt's admission.  CSW met with Mya, APS SW with [facility], who came to the BMU to interview CSW and pt as a part of the APS report that had been filed.  CSW provided Mya with copies of the physician progress notes and H&P per request.

## 2017-12-07 NOTE — [facility] Group Notes (Signed)
LCSW Group Therapy Note 12/07/2017 9:00 AM  Type of Therapy and Topic:  Group Therapy:  Setting Goals  Participation Level:  Active  Description of Group: In this process group, patients discussed using strengths to work toward goals and address challenges.  Patients identified two positive things about themselves and one goal they were working on.  Patients were given the opportunity to share openly and support each other's plan for self-empowerment.  The group discussed the value of gratitude and were encouraged to have a daily reflection of positive characteristics or circumstances.  Patients were encouraged to identify a plan to utilize their strengths to work on current challenges and goals.  Therapeutic Goals 1. Patient will verbalize personal strengths/positive qualities and relate how these can assist with achieving desired personal goals 2. Patients will verbalize affirmation of peers plans for personal change and goal setting 3. Patients will explore the value of gratitude and positive focus as related to successful achievement of goals 4. Patients will verbalize a plan for regular reinforcement of personal positive qualities and circumstances.  Summary of Patient Progress:  Melanie Bell actively participated in today's group discussion on setting goals using the SMART Model.  Melanie Bell shown that she had a good understanding of how she can use the SMART Model to help her establish her own life goals.  Melanie Bell shared with the group that after she is discharged from the hospital she plans on getting enrolled at the local community college in the Cosmetology program.  She shared that she have already been admitted into the program.  Melanie Bell shared that she really like doing hair and feels that she will be successful at completing this goal.     Therapeutic [employer] [place], LCSW 12/07/2017 12:43 PM
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
## 2017-12-07 NOTE — Progress Notes (Signed)
[facility] MD Progress Note  12/07/2017 12:52 PM Melanie Bell  MRN:  [id number]  Subjective:    We were able to identify a group home to accept [place]. Unfortunately, transfer will not be possible until Monday. Given her history of self destructive behavior, the owner does not want this patient at the home with weekend staff. Melanie Bell has no complaints and is happy that we were able to locate her mother, who now lives in [place] and has new phone number.  Principal Problem: Schizoaffective disorder, bipolar type ([place]) Diagnosis:   Patient Active Problem List   Diagnosis Date Noted  . Schizoaffective disorder, bipolar type ([place]) [F25.0] 11/06/2014    Priority: High  . Self-inflicted injury [J28.78] 11/23/2017  . ASCUS of cervix with negative high risk HPV [R87.610] 08/28/2017  . Malingering [Z76.5] 05/06/2016  . Foreign body aspiration [T17.900A]   . Other foreign object in esophagus causing other injury, subsequent encounter [T18.198D]   . Swallowed foreign body [T18.9XXA] 04/23/2016  . Foreign body ingestion [T18.9XXA] 04/21/2016  . Gastric foreign body [T18.2XXA]   . 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 Tobacco use disorder [F17.200] 09/30/2014  . Hypothyroidism [E03.9] 09/29/2014  . Hypertension [I10] 09/29/2014   Total Time spent with patient: 15 minutes  Past Psychiatric History: schizoaffective disorder  Past Medical History:  Past Medical History:  Diagnosis Date  . Anxiety   . Asthma   . Depression   . GERD (gastroesophageal reflux disease)   . Hallucinations 09/30/2014   Sizoaffective  . Hyperlipidemia   . Hypertension   . Intentional ingestion of batteries 04/21/2016    2 AAA batteries and 1 thumb tack; intent to hurt herself/notes 04/21/2016  . Tardive dyskinesia 10/2014   recent onset    Past Surgical History:  Procedure Laterality Date  . ABDOMINAL SURGERY     "years ago" to remove foreign objects  . APPENDECTOMY    . BREAST EXCISIONAL BIOPSY Right  09/03/2007   surgical bx removed fibroadeoma  . BREAST LUMPECTOMY Right   . COLONOSCOPY WITH PROPOFOL N/A 09/10/2015   Procedure: COLONOSCOPY WITH PROPOFOL;  Surgeon: [name], MD;  Location: [facility] ENDOSCOPY;  Service: Endoscopy;  Laterality: N/A;  . ESOPHAGOGASTRODUODENOSCOPY N/A 11/28/2014   Procedure: ESOPHAGOGASTRODUODENOSCOPY (EGD);  Surgeon: [name], MD;  Location: [facility] ENDOSCOPY;  Service: Endoscopy;  Laterality: N/A;  . ESOPHAGOGASTRODUODENOSCOPY N/A 02/21/2016   Procedure: ESOPHAGOGASTRODUODENOSCOPY (EGD);  Surgeon: Mauri Pole, MD;  Location: [facility] ENDOSCOPY;  Service: Endoscopy;  Laterality: N/A;  . ESOPHAGOGASTRODUODENOSCOPY N/A 04/21/2016   Procedure: ESOPHAGOGASTRODUODENOSCOPY (EGD);  Surgeon: Gatha Mayer, MD;  Location: [facility] ENDOSCOPY;  Service: Endoscopy;  Laterality: N/A;  . ESOPHAGOGASTRODUODENOSCOPY N/A 05/06/2016   Procedure: ESOPHAGOGASTRODUODENOSCOPY (EGD);  Surgeon: Jonathon Bellows, MD;  Location: [facility] ENDOSCOPY;  Service: Gastroenterology;  Laterality: N/A;  . ESOPHAGOGASTRODUODENOSCOPY N/A 06/13/2016   Procedure: ESOPHAGOGASTRODUODENOSCOPY (EGD);  Surgeon: [name], MD;  Location: [facility] ENDOSCOPY;  Service: Endoscopy;  Laterality: N/A;  . ESOPHAGOGASTRODUODENOSCOPY (EGD) WITH PROPOFOL N/A 02/29/2016   Procedure: ESOPHAGOGASTRODUODENOSCOPY (EGD) WITH PROPOFOL;  Surgeon: [name], MD;  Location: [facility] ENDOSCOPY;  Service: Endoscopy;  Laterality: N/A;  . LAPAROTOMY N/A 09/12/2015   Procedure: EXPLORATORY LAPAROTOMY;  Surgeon: Florene Glen, MD;  Location: [facility] ORS;  Service: General;  Laterality: N/A;  . SIGMOIDOSCOPY N/A 09/12/2015   Procedure: Lonell Face;  Surgeon: Florene Glen, MD;  Location: [facility] ORS;  Service: General;  Laterality: N/A;  . WISDOM TOOTH EXTRACTION     Family History:  Family History  Problem  Relation Age of Onset  . Depression Mother   . Hypertension Mother   . Sleep apnea Mother   . Asthma Mother   . COPD Mother   .  Diabetes Mother   . Breast cancer Maternal Aunt        20's   Family Psychiatric  History: depression Social History:  Social History   Substance and Sexual Activity  Alcohol Use No     Social History   Substance and Sexual Activity  Drug Use No    Social History   Socioeconomic History  . Marital status: Single    Spouse name: Not on file  . Number of children: Not on file  . Years of education: Not on file  . Highest education level: Not on file  Occupational History  . Not on file  Social Needs  . Financial resource strain: Not on file  . Food insecurity:    Worry: Not on file    Inability: Not on file  . Transportation needs:    Medical: Not on file    Non-medical: Not on file  Tobacco Use  . Smoking status: Former Smoker    Packs/day: 0.50    Years: 3.00    Pack years: 1.50    Types: Cigarettes    Last attempt to quit: 08/2016    Years since quitting: 1.3  . Smokeless tobacco: Never Used  Substance and Sexual Activity  . Alcohol use: No  . Drug use: No  . Sexual activity: Never    Birth control/protection: Injection  Lifestyle  . Physical activity:    Days per week: Not on file    Minutes per session: Not on file  . Stress: Not on file  Relationships  . Social connections:    Talks on phone: Not on file    Gets together: Not on file    Attends religious service: Not on file    Active member of club or organization: Not on file    Attends meetings of clubs or organizations: Not on file    Relationship status: Not on file  Other Topics Concern  . Not on file  Social History Narrative  . Not on file   Additional Social History:                         Sleep: Fair  Appetite:  Fair  Current Medications: Current Facility-Administered Medications  Medication Dose Route Frequency Provider Last Rate Last Dose  . acetaminophen (TYLENOL) tablet 650 mg  650 mg Oral Q6H PRN [name], [name], MD   650 mg at 12/03/17 2133  . alum & mag  hydroxide-simeth (MAALOX/MYLANTA) 200-200-20 MG/5ML suspension 30 mL  30 mL Oral Q4H PRN [name], [name], MD      . amLODipine (NORVASC) tablet 2.5 mg  2.5 mg Oral Daily [name], [name], MD   2.5 mg at 12/07/17 0802  . cetirizine HCl (Zyrtec) 5 MG/5ML solution 10 mg  10 mg Oral BID PRN [name], [name], MD      . cloZAPine (CLOZARIL) tablet 150 mg  150 mg Oral QHS [name], [name], MD   150 mg at 12/06/17 2101  . hydrOXYzine (ATARAX/VISTARIL) tablet 50 mg  50 mg Oral Q6H PRN [name], [name], MD   50 mg at 11/23/17 2333  . levothyroxine (SYNTHROID, LEVOTHROID) tablet 100 mcg  100 mcg Oral QAC breakfast [name], [name], MD   100 mcg at 12/07/17 0802  .  lithium carbonate (LITHOBID) CR tablet 300 mg  300 mg Oral BID WC [name], [name], MD   300 mg at 12/07/17 0802  . magnesium hydroxide (MILK OF MAGNESIA) suspension 30 mL  30 mL Oral Daily PRN [name], [name], MD      . nitrofurantoin (macrocrystal-monohydrate) (MACROBID) capsule 100 mg  100 mg Oral Q12H Michoel Kunin B, MD   100 mg at 12/07/17 0802  . OXcarbazepine (TRILEPTAL) tablet 150 mg  150 mg Oral BID [name], [name], MD   150 mg at 12/07/17 1045  . pantoprazole (PROTONIX) EC tablet 40 mg  40 mg Oral Daily [name], [name], MD   40 mg at 12/07/17 0802  . polyethylene glycol (MIRALAX / GLYCOLAX) packet 17 g  17 g Oral Daily PRN [name], [name], MD   17 g at 12/03/17 2132  . senna (SENOKOT) tablet 8.6 mg  1 tablet Oral Daily PRN [name], [name], MD      . traZODone (DESYREL) tablet 50 mg  50 mg Oral QHS [name], [name], MD   50 mg at 12/06/17 2200  . tuberculin injection 5 Units  5 Units Intradermal Once Gus Littler B, MD   5 Units at 12/05/17 1733  . venlafaxine XR (EFFEXOR-XR) 24 hr capsule 150 mg  150 mg Oral Daily [name], [name], MD   150 mg at 12/07/17 0802  . Vitamin D (Ergocalciferol) (DRISDOL) capsule 50,000 Units  50,000 Units Oral Weekly [name], [name], MD        Lab Results:  Results for orders placed or performed during  the hospital encounter of 11/23/17 (from the past 48 hour(s))  CBC with Differential/Platelet     Status: None   Collection Time: 12/07/17  8:33 AM  Result Value Ref Range   WBC 7.1 3.6 - 11.0 K/uL   RBC 4.02 3.80 - 5.20 MIL/uL   Hemoglobin 12.5 12.0 - 16.0 g/dL   HCT 36.4 35.0 - 47.0 %   MCV 90.6 80.0 - 100.0 fL   MCH 31.0 26.0 - 34.0 pg   MCHC 34.3 32.0 - 36.0 g/dL   RDW 13.6 11.5 - 14.5 %   Platelets 215 150 - 440 K/uL   Neutrophils Relative % 62 %   Neutro Abs 4.4 1.4 - 6.5 K/uL   Lymphocytes Relative 26 %   Lymphs Abs 1.8 1.0 - 3.6 K/uL   Monocytes Relative 8 %   Monocytes Absolute 0.6 0.2 - 0.9 K/uL   Eosinophils Relative 3 %   Eosinophils Absolute 0.2 0 - 0.7 K/uL   Basophils Relative 1 %   Basophils Absolute 0.1 0 - 0.1 K/uL    Comment: Performed at [facility], [address] [place], [place] [address]    Blood Alcohol level:  Lab Results  Component Value Date   [facility] <10 11/23/2017   ETH <5 65/07/5463    Metabolic Disorder Labs: Lab Results  Component Value Date   HGBA1C 5.1 11/23/2017   MPG 99.67 11/23/2017   MPG 103 03/03/2016   Lab Results  Component Value Date   PROLACTIN 64.6 (H) 03/04/2016   PROLACTIN 15.8 06/16/2015   Lab Results  Component Value Date   CHOL 173 11/23/2017   TRIG 77 11/23/2017   HDL 41 11/23/2017   CHOLHDL 4.2 11/23/2017   VLDL 15 11/23/2017   LDLCALC 117 (H) 11/23/2017   LDLCALC 127 (H) 03/03/2016    Physical Findings: AIMS: Facial and Oral Movements Muscles of Facial Expression: None,  normal Lips and Perioral Area: None, normal Jaw: None, normal Tongue: None, normal,Extremity Movements Upper (arms, wrists, hands, fingers): None, normal Lower (legs, knees, ankles, toes): None, normal, Trunk Movements Neck, shoulders, hips: None, normal, Overall Severity Severity of abnormal movements (highest score from questions above): None, normal Incapacitation due to abnormal movements: None, normal Patient's  awareness of abnormal movements (rate only patient's report): No Awareness, Dental Status Current problems with teeth and/or dentures?: No Does patient usually wear dentures?: No  CIWA:    COWS:     Musculoskeletal: Strength & Muscle Tone: within normal limits Gait & Station: normal Patient leans: N/A  Psychiatric Specialty Exam: Physical Exam  Nursing note and vitals reviewed. Psychiatric: She has a normal mood and affect. Her speech is normal and behavior is normal. Thought content normal. Cognition and memory are normal. She expresses impulsivity.    Review of Systems  Neurological: Negative.   Psychiatric/Behavioral: Negative.   All other systems reviewed and are negative.   Blood pressure 120/79, pulse 78, temperature 98.5 F (36.9 C), temperature source Oral, resp. rate 18, height 5\' 6"  (1.676 m), weight 97.1 kg (214 lb), SpO2 99 %.Body mass index is 34.54 kg/m.  General Appearance: Casual  Eye Contact:  Good  Speech:  Clear and Coherent  Volume:  Normal  Mood:  Euthymic  Affect:  Appropriate  Thought Process:  Goal Directed and Descriptions of Associations: Intact  Orientation:  Full (Time, Place, and Person)  Thought Content:  WDL  Suicidal Thoughts:  No  Homicidal Thoughts:  No  Memory:  Immediate;   Fair Recent;   Fair Remote;   Fair  Judgement:  Poor  Insight:  Lacking  Psychomotor Activity:  Normal  Concentration:  Concentration: Fair and Attention Span: Fair  Recall:  [employer] of Knowledge:  Fair  Language:  Fair  Akathisia:  No  Handed:  Right  AIMS (if indicated):     Assets:  Communication Skills Desire for Improvement Financial Resources/Insurance Physical Health Resilience Social Support  ADL's:  Intact  Cognition:  WNL  Sleep:  Number of Hours: 7     Treatment Plan Summary: Daily contact with patient to assess and evaluate symptoms and progress in treatment and Medication management   [name] is a 27 year old female with a  history of schizoaffective disorder and self injurious behavior admitted after suicidal gesture by cutting her arm with a steak knife in the context of losing her place at the group home. No medication changes were offered. At the time of discharge, the patient is no longer suicidal. She is able to contract for safety. She is forward thinking and optimistic about the future.  #Mood/psychosis -continue Clozapine 150 mg nightly, Clo+NClo level 314 -continue Lithium 300 mg BID, Li0.54 -Trileptal 150 mg BID -Effexor 150 mg daily  #HTN -continue Lisinopril 2.5 mg daily  #Hypothyroidism -Synthroid 100 ug daily  #GERD -Protonix 40 mg daily  #Constipation -bowel regimen  #UTI -completed course of Keflex -continue Nitrofurantoin 100 mg BID  #Social -incompetent adult -mother the guardian  #Labs -lipid panel, TSH and A1C are normal -EKG reviewed, NSR with QTc 437 -pregnancy test is negative  #Disposition -discharge to a new group home -follow up with[facility] team    Orson Slick, MD 12/07/2017, 12:52 PM

## 2017-12-07 NOTE — BHH Suicide Risk Assessment (Addendum)
[facility] Discharge Suicide Risk Assessment   Principal Problem: Schizoaffective disorder, bipolar type [facility]) Discharge Diagnoses:  Patient Active Problem List   Diagnosis Date Noted  . Schizoaffective disorder, bipolar type ([place]) [F25.0] 11/06/2014    Priority: High  . Self-inflicted injury [B01.75] 11/23/2017  . ASCUS of cervix with negative high risk HPV [R87.610] 08/28/2017  . Malingering [Z76.5] 05/06/2016  . Foreign body aspiration [T17.900A]   . Other foreign object in esophagus causing other injury, subsequent encounter [T18.198D]   . Swallowed foreign body [T18.9XXA] 04/23/2016  . Foreign body ingestion [T18.9XXA] 04/21/2016  . Gastric foreign body [T18.2XXA]   . 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 Tobacco use disorder [F17.200] 09/30/2014  . Hypothyroidism [E03.9] 09/29/2014  . Hypertension [I10] 09/29/2014    Total Time spent with patient: 20 minutes  Musculoskeletal: Strength & Muscle Tone: within normal limits Gait & Station: normal Patient leans: N/A  Psychiatric Specialty Exam: Review of Systems  Neurological: Negative.   Psychiatric/Behavioral: Negative.   All other systems reviewed and are negative.   Blood pressure 113/74, pulse 61, temperature 98.1 F (36.7 C), resp. rate 18, height 5\' 6"  (1.676 m), weight 97.1 kg (214 lb), SpO2 99 %.Body mass index is 34.54 kg/m.  General Appearance: Casual  Eye Contact::  Good  Speech:  Clear and Coherent409  Volume:  Normal  Mood:  Euthymic  Affect:  Appropriate  Thought Process:  Goal Directed and Descriptions of Associations: Intact  Orientation:  Full (Time, Place, and Person)  Thought Content:  WDL  Suicidal Thoughts:  No  Homicidal Thoughts:  No  Memory:  Immediate;   Fair Recent;   Fair Remote;   Fair  Judgement:  Impaired  Insight:  Shallow  Psychomotor Activity:  Normal  Concentration:  Fair  Recall:  [name]  Language: Fair  Akathisia:  No  Handed:  Right  AIMS (if indicated):      Assets:  Communication Skills Desire for Improvement Financial Resources/Insurance Housing Physical Health Resilience Social Support  Sleep:  Number of Hours: 7  Cognition: WNL  ADL's:  Intact   Mental Status Per Nursing Assessment::   On Admission:  Suicidal ideation indicated by patient, Suicide plan, Self-harm thoughts, Self-harm behaviors  Demographic Factors:  Adolescent or young adult  Loss Factors: NA  Historical Factors: Prior suicide attempts, Family history of mental illness or substance abuse and Impulsivity  Risk Reduction Factors:   Sense of responsibility to family, Positive social support and Positive therapeutic relationship  Continued Clinical Symptoms:  Schizophrenia:   Depressive state Less than 71 years old  Cognitive Features That Contribute To Risk:  None    Suicide Risk:  Minimal: No identifiable suicidal ideation.  Patients presenting with no risk factors but with morbid ruminations; may be classified as minimal risk based on the severity of the depressive symptoms  Follow-up Information    Services, Psychotherapeutic Follow up.   Why:  Your ACTT will be coming out to see you on 12/11/17. Contact information: [address] [place] [address] [phone]           Plan Of Care/Follow-up recommendations:  Activity:  as tolerated Diet:  low sodium heart healthy ADA diet Other:  keep follow up appointments  Orson Slick, MD 12/11/2017, 9:17 AM

## 2017-12-07 NOTE — Progress Notes (Signed)
D: Patient denies SI/HI/AVH. Patient is appropriate and without complaint during assessment. Patient denies any depression and anxiety during assessment. On self inventory indicates 1/10 for depression and 3/10 anxiety. Patient's goal is "be honest during interview for group home."   A: Patient's safety is maintained on the unit. Patient is provided with scheduled medications.  R: Patient participate's in care, patient is planning for discharge.

## 2017-12-08 NOTE — BHH Group Notes (Signed)

## 2017-12-08 NOTE — Progress Notes (Signed)
Recreation Therapy Notes  Date: 12/08/2017  Time: 9:30 am   Location: Craft Room   Behavioral response: N/A   Intervention Topic: Coping Skills  Discussion/Intervention: Patient did not attend group.   Clinical Observations/Feedback:  Patient did not attend group.   Aarik Blank LRT/CTRS         Kamia Insalaco 12/08/2017 11:19 AM

## 2017-12-08 NOTE — Progress Notes (Signed)
Received Melanie Bell this AM in her room asleep, she refused breakfast and lunch. Her morning medications were administrated at 12 noon. She denied all of the psychiatric symptoms and looking forward to being discharge next week.

## 2017-12-08 NOTE — Progress Notes (Signed)
Nursing note 7p-7a  Pt observed interacting with peers on unit this shift. Displayed a bright affect and mood upon interaction with this Probation officer. Pt denies pain ,denies SI/HI, and also denies any audio or visual hallucinations at this time. Pt is able to verbally contract for safety with this RN. Goal: "dont dwell om past, only look at the positive" Pt is now resting in bed with eyes closed, with no signs or symptoms of pain or distress noted. Pt continues to remain safe on the unit and is observed by rounding every 15 min. RN will continue to monitor.

## 2017-12-08 NOTE — Plan of Care (Signed)
  Problem: Activity: Goal: Interest or engagement in leisure activities will improve Outcome: Progressing Goal: Imbalance in normal sleep/wake cycle will improve Outcome: Progressing   Problem: Coping: Goal: Coping ability will improve Outcome: Progressing Goal: Will verbalize feelings Outcome: Progressing   Problem: Health Behavior/Discharge Planning: Goal: Ability to make decisions will improve Outcome: Progressing Goal: Compliance with therapeutic regimen will improve Outcome: Progressing   Problem: Safety: Goal: Ability to disclose and discuss suicidal ideas will improve Outcome: Progressing Goal: Ability to identify and utilize support systems that promote safety will improve Outcome: Progressing   Problem: Self-Concept: Goal: Will verbalize positive feelings about self Outcome: Progressing Goal: Level of anxiety will decrease Outcome: Progressing

## 2017-12-08 NOTE — Tx Team (Signed)
Interdisciplinary Treatment and Diagnostic Plan Update  12/04/2017 Time of Session: 10:30am Melanie Bell MRN: [id number]  Principal Diagnosis: Schizoaffective disorder, bipolar type [facility])  Secondary Diagnoses: Principal Problem:   Schizoaffective disorder, bipolar type (Melanie Bell) Active Problems:   Hypothyroidism   Hypertension   Self-inflicted injury   Current Medications:  Current Facility-Administered Medications  Medication Dose Route Frequency Provider Last Rate Last Dose  . acetaminophen (TYLENOL) tablet 650 mg  650 mg Oral Q6H PRN [name], [name], MD   650 mg at 12/03/17 2133  . alum & mag hydroxide-simeth (MAALOX/MYLANTA) 200-200-20 MG/5ML suspension 30 mL  30 mL Oral Q4H PRN [name], [name], MD      . amLODipine (NORVASC) tablet 2.5 mg  2.5 mg Oral Daily [name], [name], MD   2.5 mg at 12/08/17 1224  . cetirizine HCl (Zyrtec) 5 MG/5ML solution 10 mg  10 mg Oral BID PRN [name], [name], MD      . cloZAPine (CLOZARIL) tablet 150 mg  150 mg Oral QHS [name], [name], MD   150 mg at 12/07/17 2132  . hydrOXYzine (ATARAX/VISTARIL) tablet 50 mg  50 mg Oral Q6H PRN [name], [name], MD   50 mg at 11/23/17 2333  . levothyroxine (SYNTHROID, LEVOTHROID) tablet 100 mcg  100 mcg Oral QAC breakfast [name], [name], MD   100 mcg at 12/08/17 1225  . lithium carbonate (LITHOBID) CR tablet 300 mg  300 mg Oral BID WC [name], [name], MD   300 mg at 12/08/17 1224  . magnesium hydroxide (MILK OF MAGNESIA) suspension 30 mL  30 mL Oral Daily PRN [name], [name], MD      . nitrofurantoin (macrocrystal-monohydrate) (MACROBID) capsule 100 mg  100 mg Oral Q12H [name], [name], MD   100 mg at 12/08/17 1223  . OXcarbazepine (TRILEPTAL) tablet 150 mg  150 mg Oral BID [name], [name], MD   150 mg at 12/08/17 1224  . pantoprazole (PROTONIX) EC tablet 40 mg  40 mg Oral Daily [name], [name], MD   40 mg at 12/08/17 1224  . polyethylene glycol (MIRALAX / GLYCOLAX) packet 17 g  17 g Oral Daily PRN  [name], [name], MD   17 g at 12/03/17 2132  . senna (SENOKOT) tablet 8.6 mg  1 tablet Oral Daily PRN [name], [name], MD      . traZODone (DESYREL) tablet 50 mg  50 mg Oral QHS [name], [name], MD   50 mg at 12/07/17 2132  . venlafaxine XR (EFFEXOR-XR) 24 hr capsule 150 mg  150 mg Oral Daily [name], [name], MD   150 mg at 12/08/17 1223  . Vitamin D (Ergocalciferol) (DRISDOL) capsule 50,000 Units  50,000 Units Oral Weekly [name], [name], MD   50,000 Units at 12/07/17 1300   PTA Medications: Medications Prior to Admission  Medication Sig Dispense Refill Last Dose  . amLODipine (NORVASC) 2.5 MG tablet Take 1 tablet (2.5 mg total) by mouth daily. 30 tablet 0 11/22/2017 at Unknown time  . cetirizine (ZYRTEC) 10 MG tablet Take 10 mg by mouth daily as needed for allergies.   prn at prn  . clozapine (CLOZARIL) 50 MG tablet Take 150 mg by mouth.   11/22/2017 at Unknown time  . hydrOXYzine (ATARAX/VISTARIL) 50 MG tablet Take 50 mg by mouth 2 (two) times daily as needed.   prn at prn  . levothyroxine (SYNTHROID, LEVOTHROID) 100 MCG tablet Take 100 mcg by mouth daily before breakfast.   11/22/2017  at Unknown time  . lithium carbonate 300 MG capsule Take 300 mg by mouth 2 (two) times daily with a meal.    11/22/2017 at Unknown time  . medroxyPROGESTERone (DEPO-PROVERA) 150 MG/ML injection Inject 150 mg into the muscle every 3 (three) months.   unknowbn at unknown  . omeprazole (PRILOSEC) 20 MG capsule Take 20 mg by mouth daily.   11/22/2017 at Unknown time  . OXcarbazepine (TRILEPTAL) 150 MG tablet Take 1 tablet (150 mg total) by mouth 2 (two) times daily. (Patient taking differently: Take 300 mg by mouth 2 (two) times daily. ) 60 tablet 0 11/22/2017 at Unknown time  . polyethylene glycol (MIRALAX / GLYCOLAX) packet Take 17 g by mouth daily as needed.   prn at prn  . senna (SENOKOT) 8.6 MG TABS tablet Take 1 tablet by mouth daily as needed for mild constipation.   prn at prn  . traZODone (DESYREL) 50 MG tablet  Take 50 mg by mouth at bedtime.   prn at prn  . venlafaxine XR (EFFEXOR-XR) 150 MG 24 hr capsule Take 1 capsule (150 mg total) by mouth daily with breakfast. 30 capsule 0 11/22/2017 at Unknown time  . Vitamin D, Ergocalciferol, (DRISDOL) 50000 units CAPS capsule Take 50,000 Units by mouth every 7 (seven) days.   Past Week at Unknown time    Patient Stressors: Traumatic event Other: Uncontrolled Anger Displacement aeb Self Inflicting Wounds  Patient Strengths: Ability for insight Motivation for treatment/growth  Treatment Modalities: Medication Management, Group therapy, Case management,  1 to 1 session with clinician, Psychoeducation, Recreational therapy.   Physician Treatment Plan for Primary Diagnosis: Schizoaffective disorder, bipolar type ([name]) Long Term Goal(s): Improvement in symptoms so as ready for discharge Improvement in symptoms so as ready for discharge   Short Term Goals: Ability to identify changes in lifestyle to reduce recurrence of condition will improve Ability to verbalize feelings will improve Ability to disclose and discuss suicidal ideas Ability to demonstrate self-control will improve Ability to identify and develop effective coping behaviors will improve Ability to identify triggers associated with substance abuse/mental health issues will improve Ability to identify changes in lifestyle to reduce recurrence of condition will improve Ability to demonstrate self-control will improve Ability to identify triggers associated with substance abuse/mental health issues will improve  Medication Management: Evaluate patient's response, side effects, and tolerance of medication regimen.  Therapeutic Interventions: 1 to 1 sessions, Unit Group sessions and Medication administration.  Evaluation of Outcomes: Progressing  Physician Treatment Plan for Secondary Diagnosis: Principal Problem:   Schizoaffective disorder, bipolar type ([name]) Active Problems:    Hypothyroidism   Hypertension   Self-inflicted injury  Long Term Goal(s): Improvement in symptoms so as ready for discharge Improvement in symptoms so as ready for discharge   Short Term Goals: Ability to identify changes in lifestyle to reduce recurrence of condition will improve Ability to verbalize feelings will improve Ability to disclose and discuss suicidal ideas Ability to demonstrate self-control will improve Ability to identify and develop effective coping behaviors will improve Ability to identify triggers associated with substance abuse/mental health issues will improve Ability to identify changes in lifestyle to reduce recurrence of condition will improve Ability to demonstrate self-control will improve Ability to identify triggers associated with substance abuse/mental health issues will improve     Medication Management: Evaluate patient's response, side effects, and tolerance of medication regimen.  Therapeutic Interventions: 1 to 1 sessions, Unit Group sessions and Medication administration.  Evaluation of Outcomes: Progressing   RN  Treatment Plan for Primary Diagnosis: Schizoaffective disorder, bipolar type ([place]) Long Term Goal(s): Knowledge of disease and therapeutic regimen to maintain health will improve  Short Term Goals: Ability to demonstrate self-control, Ability to disclose and discuss suicidal ideas, Ability to identify and develop effective coping behaviors will improve and Compliance with prescribed medications will improve  Medication Management: RN will administer medications as ordered by provider, will assess and evaluate patient's response and provide education to patient for prescribed medication. RN will report any adverse and/or side effects to prescribing provider.  Therapeutic Interventions: 1 on 1 counseling sessions, Psychoeducation, Medication administration, Evaluate responses to treatment, Monitor vital signs and CBGs as ordered,  Perform/monitor CIWA, COWS, AIMS and Fall Risk screenings as ordered, Perform wound care treatments as ordered.  Evaluation of Outcomes: Progressing   LCSW Treatment Plan for Primary Diagnosis: Schizoaffective disorder, bipolar type ([place]) Long Term Goal(s): Safe transition to appropriate next level of care at discharge, Engage patient in therapeutic group addressing interpersonal concerns.  Short Term Goals: Engage patient in aftercare planning with referrals and resources, Increase social support, Increase emotional regulation, Identify triggers associated with mental health/substance abuse issues and Increase skills for wellness and recovery  Therapeutic Interventions: Assess for all discharge needs, 1 to 1 time with Social worker, Explore available resources and support systems, Assess for adequacy in community support network, Educate family and significant other(s) on suicide prevention, Complete Psychosocial Assessment, Interpersonal group therapy.  Evaluation of Outcomes: Progressing   Progress in Treatment: Attending groups: Yes. Participating in groups: Yes. Taking medication as prescribed: Yes. Toleration medication: Yes. Family/Significant other contact made: Yes, individual(s) contacted:  Pt's mother/guardian, [name], has been contacted. Patient understands diagnosis: Yes. Discussing patient identified problems/goals with staff: Yes. Medical problems stabilized or resolved: Yes. Denies suicidal/homicidal ideation: Yes. Issues/concerns per patient self-inventory: No. Other:   New problem(s) identified: No, Describe:  None  New Short Term/Long Term Goal(s): "Start thinking before I act and control my anger."  Patient Goals:  "Start thinking before I act and control my anger."  Discharge Plan or Barriers: Pt will discharge to [facility] on Monday, December 11, 2017  She will resume services with [facility].  Reason for Continuation of Hospitalization: Medication  stabilization Other; describe Aftercare Planning  Estimated Length of Stay: 2-3 days  Recreational Therapy: Patient Stressors: The group home staff  Patient Goal: Patient will successfully identify 2 ways of making healthy decisions post d/c within 5 recreation therapy group sessions  Attendees: Patient:  12/04/2017 3:27 PM  Physician: Dr. Orson Slick, MD 12/04/2017 3:27 PM  Nursing: Loel Lofty, RN  7/222/2019 3:27 PM  RN Care Manager: 12/04/2017 3:27 PM  Social [place], LCSW 12/04/2017 3:27 PM  Recreational Therapist: Isaias Sakai. Marcello Fennel, LRT 12/04/2017 3:27 PM  Other:  12/04/2017 3:27 PM  Other: Darin Engels,  [place] 12/04/2017 3:27 PM  Other: 12/04/2017 3:27 PM    Scribe for Treatment Team: [name], LCSW 12/08/2017 3:27 PM

## 2017-12-08 NOTE — Progress Notes (Signed)
Nursing note 7p-7a  Pt observed interacting with peers on unit this shift. Displayed a bright affect and mood upon interaction with this Probation officer. Spoke of having a good day stated that she was excited for upcoming d/c. Pt denies pain ,denies SI/HI, and also denies any audio or visual hallucinations at this time. Wounds clean dry and intact. Pt complains of itching on wounds. Educated that wounds were healing, pt requested neosporin ointment with no further complaints.  Pt is able to verbally contract for safety with this RN. Goal:  "to control my anger' Pt is now resting in bed with eyes closed, with no signs or symptoms of pain or distress noted. Pt continues to remain safe on the unit and is observed by rounding every 15 min. RN will continue to monitor.

## 2017-12-08 NOTE — [facility] Group Notes (Signed)
[facility] Group Notes:  (Nursing/MHT/Case Management/Adjunct)  Date:  12/08/2017  Time:  3:46 AM  Type of Therapy:  Group Therapy  Participation Level:  Active  Participation Quality:  Appropriate  Affect:  Appropriate  Cognitive:  Appropriate  Insight:  Appropriate  Engagement in Group:  Engaged  Modes of Intervention:  Support  Summary of Progress/Problems:  Melanie Bell 12/08/2017, 3:46 AM

## 2017-12-09 MED ORDER — TRAZODONE HCL 50 MG PO TABS
50.0000 mg | ORAL_TABLET | Freq: Every evening | ORAL | Status: DC | PRN
  Administered 2017-12-10: 50 mg via ORAL
  Filled 2017-12-09: qty 1

## 2017-12-09 NOTE — [facility] Group Notes (Signed)
LCSW Group Therapy Note  12/09/2017 1:15pm  Type of Therapy and Topic:  Group Therapy:  Cognitive Distortions  Participation Level:  Active   Description of Group:    Patients in this group will be introduced to the topic of cognitive distortions.  Patients will identify and describe cognitive distortions, describe the feelings these distortions create for them.  Patients will identify one or more situations in their personal life where they have cognitively distorted thinking and will verbalize challenging this cognitive distortion through positive thinking skills.  Patients will practice the skill of using positive affirmations to challenge cognitive distortions using affirmation cards.    Therapeutic Goals:  1. Patient will identify two or more cognitive distortions they have used 2. Patient will identify one or more emotions that stem from use of a cognitive distortion 3. Patient will demonstrate use of a positive affirmation to counter a cognitive distortion through discussion and/or role play. 4. Patient will describe one way cognitive distortions can be detrimental to wellness   Summary of Patient Progress: The patient reported that she feels "good". Patients were introduced to the topic of cognitive distortions. The patient was able to identify and describe cognitive distortions, described the feelings these distortions create for her. Patient identified a situation in her personal life where he/she has cognitively distorted thinking and was able to verbalize and challenged this cognitive distortion through positive thinking skills. Patient was able to provide support and validation to other group members.    Therapeutic Modalities:   Cognitive Behavioral Therapy Motivational Interviewing   Azzam Mehra  CUEBAS-COLON, LCSW 12/09/2017 3:50 PM

## 2017-12-09 NOTE — Progress Notes (Signed)
Received Melanie Bell this AM in her room, she refused breakfast, but was compliant with her medications. She was OOB earlier today and attending the group therapy sessions. She went out in the courtyard and read her book. She is looking forward to going to the group home on Monday.

## 2017-12-09 NOTE — Progress Notes (Signed)
Patient ID: Melanie Bell, female   DOB: September 05, 1990, 27 y.o.   MRN: [id number]   Per State regulations 482.30 this chart was reviewed for medical necessity with respect to the patient's admission/duration of stay.    Next review date: 12/13/17  Debarah Crape, BSN, RN-BC  Case Manager

## 2017-12-09 NOTE — Progress Notes (Signed)
[facility] MD Progress Note  12/09/2017 11:29 AM Melanie Bell  MRN:  [id number]  Subjective:   Pt seen and chart reviewed.  She reports feeling better, and no SI or urge to hurt herself (SIB: stabbing her leg). She said that she realized that she needs to talk with her ACT about her stress, so that it would not accumulate.  She is happy to go to a different group home.   She tolerates her meds, except complaining about "sleeping too much".  We agreed to switch her trazodone to PRN.   Principal Problem: Schizoaffective disorder, bipolar type ([place]) Diagnosis:   Patient Active Problem List   Diagnosis Date Noted  . Self-inflicted injury [K02.54] 11/23/2017  . ASCUS of cervix with negative high risk HPV [R87.610] 08/28/2017  . Malingering [Z76.5] 05/06/2016  . Foreign body aspiration [T17.900A]   . Other foreign object in esophagus causing other injury, subsequent encounter [T18.198D]   . Swallowed foreign body [T18.9XXA] 04/23/2016  . Foreign body ingestion [T18.9XXA] 04/21/2016  . Gastric foreign body [T18.2XXA]   . 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 Schizoaffective disorder, bipolar type ([place]) [F25.0] 11/06/2014  . Tobacco use disorder [F17.200] 09/30/2014  . Hypothyroidism [E03.9] 09/29/2014  . Hypertension [I10] 09/29/2014   Total Time spent with patient: 20 minutes  Past Psychiatric History: schizoaffective disorder  Past Medical History:  Past Medical History:  Diagnosis Date  . Anxiety   . Asthma   . Depression   . GERD (gastroesophageal reflux disease)   . Hallucinations 09/30/2014   Sizoaffective  . Hyperlipidemia   . Hypertension   . Intentional ingestion of batteries 04/21/2016    2 AAA batteries and 1 thumb tack; intent to hurt herself/notes 04/21/2016  . Tardive dyskinesia 10/2014   recent onset    Past Surgical History:  Procedure Laterality Date  . ABDOMINAL SURGERY     "years ago" to remove foreign objects  . APPENDECTOMY    . BREAST EXCISIONAL BIOPSY  Right 09/03/2007   surgical bx removed fibroadeoma  . BREAST LUMPECTOMY Right   . COLONOSCOPY WITH PROPOFOL N/A 09/10/2015   Procedure: COLONOSCOPY WITH PROPOFOL;  Surgeon: [name], MD;  Location: [facility] ENDOSCOPY;  Service: Endoscopy;  Laterality: N/A;  . ESOPHAGOGASTRODUODENOSCOPY N/A 11/28/2014   Procedure: ESOPHAGOGASTRODUODENOSCOPY (EGD);  Surgeon: [name], MD;  Location: [facility] ENDOSCOPY;  Service: Endoscopy;  Laterality: N/A;  . ESOPHAGOGASTRODUODENOSCOPY N/A 02/21/2016   Procedure: ESOPHAGOGASTRODUODENOSCOPY (EGD);  Surgeon: Mauri Pole, MD;  Location: [facility] ENDOSCOPY;  Service: Endoscopy;  Laterality: N/A;  . ESOPHAGOGASTRODUODENOSCOPY N/A 04/21/2016   Procedure: ESOPHAGOGASTRODUODENOSCOPY (EGD);  Surgeon: Gatha Mayer, MD;  Location: [facility] ENDOSCOPY;  Service: Endoscopy;  Laterality: N/A;  . ESOPHAGOGASTRODUODENOSCOPY N/A 05/06/2016   Procedure: ESOPHAGOGASTRODUODENOSCOPY (EGD);  Surgeon: Jonathon Bellows, MD;  Location: [facility] ENDOSCOPY;  Service: Gastroenterology;  Laterality: N/A;  . ESOPHAGOGASTRODUODENOSCOPY N/A 06/13/2016   Procedure: ESOPHAGOGASTRODUODENOSCOPY (EGD);  Surgeon: [name], MD;  Location: [facility] ENDOSCOPY;  Service: Endoscopy;  Laterality: N/A;  . ESOPHAGOGASTRODUODENOSCOPY (EGD) WITH PROPOFOL N/A 02/29/2016   Procedure: ESOPHAGOGASTRODUODENOSCOPY (EGD) WITH PROPOFOL;  Surgeon: [name], MD;  Location: [facility] ENDOSCOPY;  Service: Endoscopy;  Laterality: N/A;  . LAPAROTOMY N/A 09/12/2015   Procedure: EXPLORATORY LAPAROTOMY;  Surgeon: Florene Glen, MD;  Location: [facility] ORS;  Service: General;  Laterality: N/A;  . SIGMOIDOSCOPY N/A 09/12/2015   Procedure: Lonell Face;  Surgeon: Florene Glen, MD;  Location: [facility] ORS;  Service: General;  Laterality: N/A;  . WISDOM TOOTH EXTRACTION  Family History:  Family History  Problem Relation Age of Onset  . Depression Mother   . Hypertension Mother   . Sleep apnea Mother   . Asthma Mother   . COPD Mother    . Diabetes Mother   . Breast cancer Maternal Aunt        20's   Family Psychiatric  History: depression Social History:  Social History   Substance and Sexual Activity  Alcohol Use No     Social History   Substance and Sexual Activity  Drug Use No    Social History   Socioeconomic History  . Marital status: Single    Spouse name: Not on file  . Number of children: Not on file  . Years of education: Not on file  . Highest education level: Not on file  Occupational History  . Not on file  Social Needs  . Financial resource strain: Not on file  . Food insecurity:    Worry: Not on file    Inability: Not on file  . Transportation needs:    Medical: Not on file    Non-medical: Not on file  Tobacco Use  . Smoking status: Former Smoker    Packs/day: 0.50    Years: 3.00    Pack years: 1.50    Types: Cigarettes    Last attempt to quit: 08/2016    Years since quitting: 1.3  . Smokeless tobacco: Never Used  Substance and Sexual Activity  . Alcohol use: No  . Drug use: No  . Sexual activity: Never    Birth control/protection: Injection  Lifestyle  . Physical activity:    Days per week: Not on file    Minutes per session: Not on file  . Stress: Not on file  Relationships  . Social connections:    Talks on phone: Not on file    Gets together: Not on file    Attends religious service: Not on file    Active member of club or organization: Not on file    Attends meetings of clubs or organizations: Not on file    Relationship status: Not on file  Other Topics Concern  . Not on file  Social History Narrative  . Not on file   Additional Social History:   Sleep: Good  Appetite:  Good  Current Medications: Current Facility-Administered Medications  Medication Dose Route Frequency Provider Last Rate Last Dose  . acetaminophen (TYLENOL) tablet 650 mg  650 mg Oral Q6H PRN [name], [name], MD   650 mg at 12/03/17 2133  . alum & mag hydroxide-simeth (MAALOX/MYLANTA)  200-200-20 MG/5ML suspension 30 mL  30 mL Oral Q4H PRN [name], [name], MD      . amLODipine (NORVASC) tablet 2.5 mg  2.5 mg Oral Daily [name], [name], MD   2.5 mg at 12/09/17 0845  . cetirizine HCl (Zyrtec) 5 MG/5ML solution 10 mg  10 mg Oral BID PRN [name], [name], MD      . cloZAPine (CLOZARIL) tablet 150 mg  150 mg Oral QHS [name], [name], MD   150 mg at 12/08/17 2120  . hydrOXYzine (ATARAX/VISTARIL) tablet 50 mg  50 mg Oral Q6H PRN [name], [name], MD   50 mg at 11/23/17 2333  . levothyroxine (SYNTHROID, LEVOTHROID) tablet 100 mcg  100 mcg Oral QAC breakfast [name], [name], MD   100 mcg at 12/09/17 0845  . lithium carbonate (LITHOBID) CR tablet 300 mg  300 mg Oral BID WC [name],  [name], MD   300 mg at 12/09/17 0845  . magnesium hydroxide (MILK OF MAGNESIA) suspension 30 mL  30 mL Oral Daily PRN [name], [name], MD      . nitrofurantoin (macrocrystal-monohydrate) (MACROBID) capsule 100 mg  100 mg Oral Q12H [name], [name], MD   100 mg at 12/09/17 0845  . OXcarbazepine (TRILEPTAL) tablet 150 mg  150 mg Oral BID [name], [name], MD   150 mg at 12/09/17 0844  . pantoprazole (PROTONIX) EC tablet 40 mg  40 mg Oral Daily [name], [name], MD   40 mg at 12/09/17 0845  . polyethylene glycol (MIRALAX / GLYCOLAX) packet 17 g  17 g Oral Daily PRN [name], [name], MD   17 g at 12/03/17 2132  . senna (SENOKOT) tablet 8.6 mg  1 tablet Oral Daily PRN [name], [name], MD      . traZODone (DESYREL) tablet 50 mg  50 mg Oral QHS [name], [name], MD   50 mg at 12/08/17 2120  . venlafaxine XR (EFFEXOR-XR) 24 hr capsule 150 mg  150 mg Oral Daily [name], [name], MD   150 mg at 12/09/17 0844  . Vitamin D (Ergocalciferol) (DRISDOL) capsule 50,000 Units  50,000 Units Oral Weekly [name], [name], MD   50,000 Units at 12/07/17 1300    Lab Results:  No results found for this or any previous visit (from the past 48 hour(s)).  Blood Alcohol level:  Lab Results  Component Value Date   ETH <10 11/23/2017    ETH <5 78/29/5621    Metabolic Disorder Labs: Lab Results  Component Value Date   HGBA1C 5.1 11/23/2017   MPG 99.67 11/23/2017   MPG 103 03/03/2016   Lab Results  Component Value Date   PROLACTIN 64.6 (H) 03/04/2016   PROLACTIN 15.8 06/16/2015   Lab Results  Component Value Date   CHOL 173 11/23/2017   TRIG 77 11/23/2017   HDL 41 11/23/2017   CHOLHDL 4.2 11/23/2017   VLDL 15 11/23/2017   LDLCALC 117 (H) 11/23/2017   LDLCALC 127 (H) 03/03/2016    Physical Findings: AIMS: Facial and Oral Movements Muscles of Facial Expression: None, normal Lips and Perioral Area: None, normal Jaw: None, normal Tongue: None, normal,Extremity Movements Upper (arms, wrists, hands, fingers): None, normal Lower (legs, knees, ankles, toes): None, normal, Trunk Movements Neck, shoulders, hips: None, normal, Overall Severity Severity of abnormal movements (highest score from questions above): None, normal Incapacitation due to abnormal movements: None, normal Patient's awareness of abnormal movements (rate only patient's report): No Awareness, Dental Status Current problems with teeth and/or dentures?: No Does patient usually wear dentures?: No  CIWA:    COWS:     Musculoskeletal: Strength & Muscle Tone: within normal limits Gait & Station: normal Patient leans: N/A  Psychiatric Specialty Exam: Physical Exam  Nursing note and vitals reviewed. Psychiatric: She has a normal mood and affect. Her speech is normal and behavior is normal. Thought content normal. Cognition and memory are normal. She expresses impulsivity.    Review of Systems  Neurological: Negative.   Psychiatric/Behavioral: Negative.   All other systems reviewed and are negative.   Blood pressure 103/80, pulse 81, temperature 98.3 F (36.8 C), temperature source Oral, resp. rate 18, height 5\' 6"  (1.676 m), weight 97.1 kg (214 lb), SpO2 98 %.Body mass index is 34.54 kg/m.  General Appearance: Fairly Groomed  Eye Contact:   Fair  Speech:  Normal Rate  Volume:  Normal  Mood:  Euthymic  Affect:  Appropriate and Congruent  Thought Process:  Coherent, Goal Directed and Descriptions of Associations: Intact  Orientation:  Full (Time, Place, and Person)  Thought Content:  WDL and Logical  Suicidal Thoughts:  No  Homicidal Thoughts:  No  Memory:  Immediate;   Good Recent;   Good Remote;   Good  Judgement:  Fair  Insight:  Fair and Shallow  Psychomotor Activity:  Normal  Concentration:  Concentration: Good and Attention Span: Good  Recall:  Good  Fund of Knowledge:  Fair  Language:  Fair  Akathisia:  No  Handed:  Right  AIMS (if indicated):     Assets:  Communication Skills Desire for Improvement Physical Health Social Support  ADL's:  Intact  Cognition:  WNL  Sleep:  Number of Hours: 6.25    Treatment Plan Summary: Daily contact with patient to assess and evaluate symptoms and progress in treatment and Medication management   [name] is a 27 year old female with a history of schizoaffective disorder and self injurious behavior (SIB) admitted after suicidal gesture by cutting her arm with a steak knife in the context of losing her place at the group home.she has been calm and cooperative on the unit, no major medication changes were indicated. Currently, she is no longer suicidal or homicidal, denied urges for SIB. She is able to contract for safety. She is forward thinking and optimistic about the future. She is happy to go to a different group home.   #Mood/psychosis, improved -continue Clozapine 150 mg nightly, Clo+NClo level 314 -continue Lithium 300 mg BID, Li0.54 -Trileptal 150 mg BID -Effexor 150 mg daily -changed Trazodone 50mg  qhs, to prn.   #HTN, stable -continueLisinopril 2.5 mg daily  #Hypothyroidism -Synthroid 100 ug daily  #GERD -Protonix 40 mg daily  #Constipation -bowel regimen  #UTI, resolved -continue Nitrofurantoin 100 mg BID  #Social -incompetent  adult -mother is the guardian  #Labs -lipid panel, TSH and A1Care normal -EKGreviewed, NSR with QTc 437 -pregnancy test is negative  #Disposition -discharge toa newgroup home -follow up with[facility] team    Karole Oo, MD 12/09/2017, 11:29 AM

## 2017-12-10 NOTE — Progress Notes (Signed)
[facility] MD Progress Note  12/10/2017 11:23 AM Melanie Bell  MRN:  [id number]  Subjective:   Pt seen and chart reviewed.  She continue to report feeling well, no SI and no urge for SIB. She is looking forward to going to a new Group home tomorrow, stating "that is a fresh start". She is encouraged to verbalize her frustration and feeling with her ACT therapist, instead of harming herself. She expressed understanding.  She slept well without Trazodone last night.   Principal Problem: Schizoaffective disorder, bipolar type ([place]) Diagnosis:   Patient Active Problem List   Diagnosis Date Noted  . Self-inflicted injury [X52.84] 11/23/2017  . ASCUS of cervix with negative high risk HPV [R87.610] 08/28/2017  . Malingering [Z76.5] 05/06/2016  . Foreign body aspiration [T17.900A]   . Other foreign object in esophagus causing other injury, subsequent encounter [T18.198D]   . Swallowed foreign body [T18.9XXA] 04/23/2016  . Foreign body ingestion [T18.9XXA] 04/21/2016  . Gastric foreign body [T18.2XXA]   . 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 Schizoaffective disorder, bipolar type ([place]) [F25.0] 11/06/2014  . Tobacco use disorder [F17.200] 09/30/2014  . Hypothyroidism [E03.9] 09/29/2014  . Hypertension [I10] 09/29/2014   Total Time spent with patient: 20 minutes  Past Psychiatric History: schizoaffective disorder  Past Medical History:  Past Medical History:  Diagnosis Date  . Anxiety   . Asthma   . Depression   . GERD (gastroesophageal reflux disease)   . Hallucinations 09/30/2014   Sizoaffective  . Hyperlipidemia   . Hypertension   . Intentional ingestion of batteries 04/21/2016    2 AAA batteries and 1 thumb tack; intent to hurt herself/notes 04/21/2016  . Tardive dyskinesia 10/2014   recent onset    Past Surgical History:  Procedure Laterality Date  . ABDOMINAL SURGERY     "years ago" to remove foreign objects  . APPENDECTOMY    . BREAST EXCISIONAL BIOPSY Right 09/03/2007    surgical bx removed fibroadeoma  . BREAST LUMPECTOMY Right   . COLONOSCOPY WITH PROPOFOL N/A 09/10/2015   Procedure: COLONOSCOPY WITH PROPOFOL;  Surgeon: [name], MD;  Location: [facility] ENDOSCOPY;  Service: Endoscopy;  Laterality: N/A;  . ESOPHAGOGASTRODUODENOSCOPY N/A 11/28/2014   Procedure: ESOPHAGOGASTRODUODENOSCOPY (EGD);  Surgeon: [name], MD;  Location: [facility] ENDOSCOPY;  Service: Endoscopy;  Laterality: N/A;  . ESOPHAGOGASTRODUODENOSCOPY N/A 02/21/2016   Procedure: ESOPHAGOGASTRODUODENOSCOPY (EGD);  Surgeon: Mauri Pole, MD;  Location: [facility] ENDOSCOPY;  Service: Endoscopy;  Laterality: N/A;  . ESOPHAGOGASTRODUODENOSCOPY N/A 04/21/2016   Procedure: ESOPHAGOGASTRODUODENOSCOPY (EGD);  Surgeon: Gatha Mayer, MD;  Location: [facility] ENDOSCOPY;  Service: Endoscopy;  Laterality: N/A;  . ESOPHAGOGASTRODUODENOSCOPY N/A 05/06/2016   Procedure: ESOPHAGOGASTRODUODENOSCOPY (EGD);  Surgeon: Jonathon Bellows, MD;  Location[facility] ENDOSCOPY;  Service: Gastroenterology;  Laterality: N/A;  . ESOPHAGOGASTRODUODENOSCOPY N/A 06/13/2016   Procedure: ESOPHAGOGASTRODUODENOSCOPY (EGD);  Surgeon: [name], MD;  Location: [facility] ENDOSCOPY;  Service: Endoscopy;  Laterality: N/A;  . ESOPHAGOGASTRODUODENOSCOPY (EGD) WITH PROPOFOL N/A 02/29/2016   Procedure: ESOPHAGOGASTRODUODENOSCOPY (EGD) WITH PROPOFOL;  Surgeon: [name], MD;  Location: [facility] ENDOSCOPY;  Service: Endoscopy;  Laterality: N/A;  . LAPAROTOMY N/A 09/12/2015   Procedure: EXPLORATORY LAPAROTOMY;  Surgeon: Florene Glen, MD;  Location: [facility] ORS;  Service: General;  Laterality: N/A;  . SIGMOIDOSCOPY N/A 09/12/2015   Procedure: Lonell Face;  Surgeon: Florene Glen, MD;  Location: [facility] ORS;  Service: General;  Laterality: N/A;  . WISDOM TOOTH EXTRACTION     Family History:  Family History  Problem Relation Age of Onset  .  Depression Mother   . Hypertension Mother   . Sleep apnea Mother   . Asthma Mother   . COPD Mother   . Diabetes Mother    . Breast cancer Maternal Aunt        20's   Family Psychiatric  History: depression Social History:  Social History   Substance and Sexual Activity  Alcohol Use No     Social History   Substance and Sexual Activity  Drug Use No    Social History   Socioeconomic History  . Marital status: Single    Spouse name: Not on file  . Number of children: Not on file  . Years of education: Not on file  . Highest education level: Not on file  Occupational History  . Not on file  Social Needs  . Financial resource strain: Not on file  . Food insecurity:    Worry: Not on file    Inability: Not on file  . Transportation needs:    Medical: Not on file    Non-medical: Not on file  Tobacco Use  . Smoking status: Former Smoker    Packs/day: 0.50    Years: 3.00    Pack years: 1.50    Types: Cigarettes    Last attempt to quit: 08/2016    Years since quitting: 1.3  . Smokeless tobacco: Never Used  Substance and Sexual Activity  . Alcohol use: No  . Drug use: No  . Sexual activity: Never    Birth control/protection: Injection  Lifestyle  . Physical activity:    Days per week: Not on file    Minutes per session: Not on file  . Stress: Not on file  Relationships  . Social connections:    Talks on phone: Not on file    Gets together: Not on file    Attends religious service: Not on file    Active member of club or organization: Not on file    Attends meetings of clubs or organizations: Not on file    Relationship status: Not on file  Other Topics Concern  . Not on file  Social History Narrative  . Not on file   Additional Social History:   Sleep: Good  Appetite:  Good  Current Medications: Current Facility-Administered Medications  Medication Dose Route Frequency Provider Last Rate Last Dose  . acetaminophen (TYLENOL) tablet 650 mg  650 mg Oral Q6H PRN [name], [name], MD   650 mg at 12/03/17 2133  . alum & mag hydroxide-simeth (MAALOX/MYLANTA) 200-200-20 MG/5ML  suspension 30 mL  30 mL Oral Q4H PRN [name], [name], MD      . amLODipine (NORVASC) tablet 2.5 mg  2.5 mg Oral Daily [name], [name], MD   2.5 mg at 12/10/17 0858  . cetirizine HCl (Zyrtec) 5 MG/5ML solution 10 mg  10 mg Oral BID PRN [name], [name], MD      . cloZAPine (CLOZARIL) tablet 150 mg  150 mg Oral QHS [name], [name], MD   150 mg at 12/09/17 2103  . hydrOXYzine (ATARAX/VISTARIL) tablet 50 mg  50 mg Oral Q6H PRN [name], [name], MD   50 mg at 11/23/17 2333  . levothyroxine (SYNTHROID, LEVOTHROID) tablet 100 mcg  100 mcg Oral QAC breakfast [name], [name], MD   100 mcg at 12/10/17 0857  . lithium carbonate (LITHOBID) CR tablet 300 mg  300 mg Oral BID WC [name], [name], MD   300 mg at 12/10/17 0857  . magnesium  hydroxide (MILK OF MAGNESIA) suspension 30 mL  30 mL Oral Daily PRN [name], [name], MD      . nitrofurantoin (macrocrystal-monohydrate) (MACROBID) capsule 100 mg  100 mg Oral Q12H [name], [name], MD   100 mg at 12/10/17 0859  . OXcarbazepine (TRILEPTAL) tablet 150 mg  150 mg Oral BID [name], [name], MD   150 mg at 12/10/17 0857  . pantoprazole (PROTONIX) EC tablet 40 mg  40 mg Oral Daily [name], [name], MD   40 mg at 12/10/17 0900  . polyethylene glycol (MIRALAX / GLYCOLAX) packet 17 g  17 g Oral Daily PRN [name], [name], MD   17 g at 12/03/17 2132  . senna (SENOKOT) tablet 8.6 mg  1 tablet Oral Daily PRN [name], [name], MD      . traZODone (DESYREL) tablet 50 mg  50 mg Oral QHS PRN Saprina Chuong, MD      . venlafaxine XR (EFFEXOR-XR) 24 hr capsule 150 mg  150 mg Oral Daily [name], [name], MD   150 mg at 12/10/17 0857  . Vitamin D (Ergocalciferol) (DRISDOL) capsule 50,000 Units  50,000 Units Oral Weekly [name], [name], MD   50,000 Units at 12/07/17 1300    Lab Results:  No results found for this or any previous visit (from the past 48 hour(s)).  Blood Alcohol level:  Lab Results  Component Value Date   ETH <10 11/23/2017   ETH <5 10/62/6948    Metabolic Disorder  Labs: Lab Results  Component Value Date   HGBA1C 5.1 11/23/2017   MPG 99.67 11/23/2017   MPG 103 03/03/2016   Lab Results  Component Value Date   PROLACTIN 64.6 (H) 03/04/2016   PROLACTIN 15.8 06/16/2015   Lab Results  Component Value Date   CHOL 173 11/23/2017   TRIG 77 11/23/2017   HDL 41 11/23/2017   CHOLHDL 4.2 11/23/2017   VLDL 15 11/23/2017   LDLCALC 117 (H) 11/23/2017   LDLCALC 127 (H) 03/03/2016    Physical Findings: AIMS: Facial and Oral Movements Muscles of Facial Expression: None, normal Lips and Perioral Area: None, normal Jaw: None, normal Tongue: None, normal,Extremity Movements Upper (arms, wrists, hands, fingers): None, normal Lower (legs, knees, ankles, toes): None, normal, Trunk Movements Neck, shoulders, hips: None, normal, Overall Severity Severity of abnormal movements (highest score from questions above): None, normal Incapacitation due to abnormal movements: None, normal Patient's awareness of abnormal movements (rate only patient's report): No Awareness, Dental Status Current problems with teeth and/or dentures?: No Does patient usually wear dentures?: No  CIWA:    COWS:     Musculoskeletal: Strength & Muscle Tone: within normal limits Gait & Station: normal Patient leans: N/A  Psychiatric Specialty Exam: Physical Exam  Nursing note and vitals reviewed. Psychiatric: She has a normal mood and affect. Her speech is normal and behavior is normal. Thought content normal. Cognition and memory are normal. She expresses impulsivity.    Review of Systems  Neurological: Negative.   Psychiatric/Behavioral: Negative.   All other systems reviewed and are negative.   Blood pressure 100/63, pulse (!) 102, temperature 98.9 F (37.2 C), temperature source Oral, resp. rate 18, height 5\' 6"  (1.676 m), weight 97.1 kg (214 lb), SpO2 99 %.Body mass index is 34.54 kg/m.  General Appearance: Fairly Groomed  Eye Contact:  Good  Speech:  Normal Rate   Volume:  Normal  Mood:  Euthymic  Affect:  Appropriate, Congruent and Constricted  Thought Process:  Coherent, Goal  Directed and Descriptions of Associations: Intact  Orientation:  Full (Time, Place, and Person)  Thought Content:  WDL and Logical  Suicidal Thoughts:  No  Homicidal Thoughts:  No  Memory:  Immediate;   Good Recent;   Good Remote;   Good  Judgement:  Fair  Insight:  Fair and Shallow  Psychomotor Activity:  Normal  Concentration:  Concentration: Good and Attention Span: Good  Recall:  Good  Fund of Knowledge:  Fair  Language:  Fair  Akathisia:  No  Handed:  Right  AIMS (if indicated):     Assets:  Communication Skills Desire for Improvement Physical Health Social Support  ADL's:  Intact  Cognition:  WNL  Sleep:  Number of Hours: 7.45    Treatment Plan Summary: Daily contact with patient to assess and evaluate symptoms and progress in treatment and Medication management   Melanie Bell is a 27 year old female with a history of schizoaffective disorder and self injurious behavior (SIB) admitted after suicidal gesture by cutting her arm with a steak knife in the context of losing her place at the group home.she has been calm and cooperative on the unit, no major medication changes were indicated. Currently, she is no longer suicidal or homicidal, denied urges for SIB. She is able to contract for safety. She is forward thinking and optimistic about the future. She is happy to go to a different group home.   #Mood/psychosis, improved -continue Clozapine 150 mg nightly, Clo+NClo level 314 -continue Lithium 300 mg BID, Li0.54 -Trileptal 150 mg BID -Effexor 150 mg daily -continueTrazodone 50mg  qhs, to prn. She slept well without it last night.   #HTN, stable -continueLisinopril 2.5 mg daily  #Hypothyroidism -Synthroid 100 ug daily  #GERD -Protonix 40 mg daily  #Constipation -bowel regimen  #UTI, resolved -continue Nitrofurantoin 100 mg  BID  #Social -incompetent adult -mother is the guardian  #Labs -lipid panel, TSH and A1Care normal -EKGreviewed, NSR with QTc 437 -pregnancy test is negative  #Disposition -discharge toa newgroup home -follow up with[facility] team    Tniyah Nakagawa, MD 12/10/2017, 11:23 AM

## 2017-12-10 NOTE — Progress Notes (Signed)
Received Melanie Bell this AM in her room asleep, she refused breakfast, but was compliant with her medications. She is sleeping in this morning which is her usual behavior. She was OOB in the milieu after lunch and remained visible in the milieu this PM.

## 2017-12-10 NOTE — [facility] Group Notes (Signed)
LCSW Group Therapy Note 12/10/2017 1:15pm  Type of Therapy and Topic: Group Therapy: Feelings Around Returning Home & Establishing a Supportive Framework and Supporting Oneself When Supports Not Available  Participation Level: Active  Description of Group:  Patients first processed thoughts and feelings about upcoming discharge. These included fears of upcoming changes, lack of change, new living environments, judgements and expectations from others and overall stigma of mental health issues. The group then discussed the definition of a supportive framework, what that looks and feels like, and how do to discern it from an unhealthy non-supportive network. The group identified different types of supports as well as what to do when your family/friends are less than helpful or unavailable  Therapeutic Goals  1. Patient will identify one healthy supportive network that they can use at discharge. 2. Patient will identify one factor of a supportive framework and how to tell it from an unhealthy network. 3. Patient able to identify one coping skill to use when they do not have positive supports from others. 4. Patient will demonstrate ability to communicate their needs through discussion and/or role plays.  Summary of Patient Progress:  Patient reported she feels "a lot better." Pt engaged during group session. As patients processed their anxiety about discharge and described healthy supports patient shared that she is ready for "a new start." Patient listed her ACT team, and some family members as her main support system.  Patients identified at least one self-care tool they were willing to use after discharge; breathing, reading, journaling, and listening to music.Marland Kitchen   Therapeutic Modalities Cognitive Behavioral Therapy Motivational Interviewing   Armas Mcbee  CUEBAS-COLON, LCSW 12/10/2017 9:46 AM

## 2017-12-10 NOTE — Plan of Care (Signed)
Patient is resting comfortably in her room without complain and voice no issues with her care, compliant with her medical regimen, responding well to treatment, no distress denies any SI/HI and no AVH noted at this time , 15 minute safety rounding in progress. Problem: Activity: Goal: Interest or engagement in leisure activities will improve Outcome: Progressing Goal: Imbalance in normal sleep/wake cycle will improve Outcome: Progressing   Problem: Coping: Goal: Coping ability will improve Outcome: Progressing Goal: Will verbalize feelings Outcome: Progressing   Problem: Health Behavior/Discharge Planning: Goal: Ability to make decisions will improve Outcome: Progressing Goal: Compliance with therapeutic regimen will improve Outcome: Progressing   Problem: Safety: Goal: Ability to disclose and discuss suicidal ideas will improve Outcome: Progressing Goal: Ability to identify and utilize support systems that promote safety will improve Outcome: Progressing   Problem: Self-Concept: Goal: Will verbalize positive feelings about self Outcome: Progressing Goal: Level of anxiety will decrease Outcome: Progressing

## 2017-12-11 LAB — CBC WITH DIFFERENTIAL/PLATELET
BASOS ABS: 0 10*3/uL (ref 0–0.1)
Basophils Relative: 1 %
Eosinophils Absolute: 0.1 10*3/uL (ref 0–0.7)
Eosinophils Relative: 2 %
HCT: 39.1 % (ref 35.0–47.0)
HEMOGLOBIN: 13.4 g/dL (ref 12.0–16.0)
Lymphocytes Relative: 26 %
Lymphs Abs: 1.6 10*3/uL (ref 1.0–3.6)
MCH: 31.1 pg (ref 26.0–34.0)
MCHC: 34.2 g/dL (ref 32.0–36.0)
MCV: 90.9 fL (ref 80.0–100.0)
MONOS PCT: 7 %
Monocytes Absolute: 0.4 10*3/uL (ref 0.2–0.9)
Neutro Abs: 4 10*3/uL (ref 1.4–6.5)
Neutrophils Relative %: 64 %
PLATELETS: 235 10*3/uL (ref 150–440)
RBC: 4.3 MIL/uL (ref 3.80–5.20)
RDW: 13.5 % (ref 11.5–14.5)
WBC: 6.2 10*3/uL (ref 3.6–11.0)

## 2017-12-11 MED ORDER — VENLAFAXINE HCL ER 150 MG PO CP24: 150.0000 mg | ORAL_CAPSULE | Freq: Every day | ORAL | 1 refills | Status: DC

## 2017-12-11 MED ORDER — POLYETHYLENE GLYCOL 3350 17 G PO PACK: 17.0000 g | PACK | Freq: Every day | ORAL | 1 refills | Status: DC | PRN

## 2017-12-11 MED ORDER — SENNA 8.6 MG PO TABS: 1.0000 | ORAL_TABLET | Freq: Every day | ORAL | 0 refills | Status: DC | PRN

## 2017-12-11 MED ORDER — CLOZAPINE 50 MG PO TABS: 150.0000 mg | ORAL_TABLET | Freq: Every day | ORAL | 1 refills | Status: DC

## 2017-12-11 MED ORDER — CETIRIZINE HCL 10 MG PO TABS: 10.0000 mg | ORAL_TABLET | Freq: Every day | ORAL | 1 refills | Status: DC | PRN

## 2017-12-11 MED ORDER — TRAZODONE HCL 50 MG PO TABS: 50.0000 mg | ORAL_TABLET | Freq: Every day | ORAL | Status: DC

## 2017-12-11 MED ORDER — AMLODIPINE BESYLATE 2.5 MG PO TABS: 2.5000 mg | ORAL_TABLET | Freq: Every day | ORAL | 0 refills | Status: DC

## 2017-12-11 MED ORDER — OXCARBAZEPINE 300 MG PO TABS: 300.0000 mg | ORAL_TABLET | Freq: Two times a day (BID) | ORAL | 1 refills | Status: DC

## 2017-12-11 MED ORDER — LEVOTHYROXINE SODIUM 100 MCG PO TABS: 100.0000 ug | ORAL_TABLET | Freq: Every day | ORAL | 1 refills | Status: DC

## 2017-12-11 MED ORDER — HYDROXYZINE HCL 50 MG PO TABS: 50.0000 mg | ORAL_TABLET | Freq: Two times a day (BID) | ORAL | 1 refills | Status: DC | PRN

## 2017-12-11 MED ORDER — LITHIUM CARBONATE 300 MG PO CAPS: 300.0000 mg | ORAL_CAPSULE | Freq: Two times a day (BID) | ORAL | 1 refills | Status: DC

## 2017-12-11 MED ORDER — OMEPRAZOLE 20 MG PO CPDR: 20.0000 mg | DELAYED_RELEASE_CAPSULE | Freq: Every day | ORAL | 1 refills | Status: DC

## 2017-12-11 MED ORDER — VITAMIN D (ERGOCALCIFEROL) 1.25 MG (50000 UNIT) PO CAPS: 50000.0000 [IU] | ORAL_CAPSULE | ORAL | 1 refills | Status: DC

## 2017-12-11 MED ORDER — MEDROXYPROGESTERONE ACETATE 150 MG/ML IM SUSP: 150.0000 mg | INTRAMUSCULAR | 1 refills | Status: DC

## 2017-12-11 MED ORDER — TRAZODONE HCL 50 MG PO TABS: 50.0000 mg | ORAL_TABLET | Freq: Every day | ORAL | 1 refills | Status: DC

## 2017-12-11 NOTE — Progress Notes (Signed)
  [facility] Adult Case Management Discharge Plan :  Will you be returning to the same living situation after discharge:  No. [place] home with [place] [facility] At discharge, do you have transportation home?: Yes,    Do you have the ability to pay for your medications: Yes,     Release of information consent forms completed and in the chart;  Patient's signature needed at discharge.  Patient to Follow up at: Follow-up Information    Services, Psychotherapeutic Follow up.   Why:  Your ACTT will be coming out to see you on 12/11/17. Contact information: [address] [place] [address] [phone]           Next level of care provider has access to [place] and Suicide Prevention discussed: Yes,     Have you used any form of tobacco in the last 30 days? (Cigarettes, Smokeless Tobacco, Cigars, and/or Pipes): No  Has patient been referred to the Quitline?: N/A patient is not a smoker  Patient has been referred for addiction treatment: Yes  [name], LCSW 12/11/2017, 4:41 PM

## 2017-12-11 NOTE — NC FL2 (Addendum)
[place] LEVEL OF CARE SCREENING TOOL     IDENTIFICATION  Patient [name] Birthdate: 12/17/1990 Sex: female Admission Date (Current Location): 11/23/2017  [place] and [place] Number:    [facility] [id number] [place] and Address:  [facility], [address], [place], [place] [address]      Provider Number: [id number]  Attending Physician Name and Address:  [name], MD  Relative Name and Phone Number:  [name] Mother and Guardian     Current Level of Care: Hospital Recommended Level of Care: Other (Comment)(Group Home) Prior Approval Number:    Date Approved/Denied:   PASRR Number:    Discharge Plan: (Group Home)    Current Diagnoses: Patient Active Problem List   Diagnosis Date Noted  . Self-inflicted injury 15/17/6160  . ASCUS of cervix with negative high risk HPV 08/28/2017  . Malingering 05/06/2016  . Foreign body aspiration   . Other foreign object in esophagus causing other injury, subsequent encounter   . Swallowed foreign body 04/23/2016  . Foreign body ingestion 04/21/2016  . Gastric foreign body   . Breast tumor 03/05/2016  . Schizoaffective disorder, bipolar type ([place]) 11/06/2014  . Tobacco use disorder 09/30/2014  . Hypothyroidism 09/29/2014  . Hypertension 09/29/2014    Orientation RESPIRATION BLADDER Height & Weight     Self, Time, Situation, Place  Normal Continent Weight: 214 lb (97.1 kg) Height:  5\' 6"  (167.6 cm)  BEHAVIORAL SYMPTOMS/MOOD NEUROLOGICAL BOWEL NUTRITION STATUS  Other (Comment)(history of swallowing foriegn objects and some self-injurious behavior)   Continent    AMBULATORY STATUS COMMUNICATION OF NEEDS Skin   Independent Verbally Normal                       Personal Care Assistance Level of Assistance  Bathing, Feeding, Dressing Bathing Assistance: Independent Feeding assistance: Independent Dressing Assistance: Independent     Functional  Limitations Info  Sight, Hearing, Speech Sight Info: Adequate Hearing Info: Adequate Speech Info: Adequate    SPECIAL CARE FACTORS FREQUENCY                       Contractures Contractures Info: Not present    Additional Factors Info                  Current Medications (12/11/2017):  This is the current hospital active medication list Current Facility-Administered Medications  Medication Dose Route Frequency Provider Last Rate Last Dose  . acetaminophen (TYLENOL) tablet 650 mg  650 mg Oral Q6H PRN [name], [name], MD   650 mg at 12/03/17 2133  . alum & mag hydroxide-simeth (MAALOX/MYLANTA) 200-200-20 MG/5ML suspension 30 mL  30 mL Oral Q4H PRN [name], [name], MD      . amLODipine (NORVASC) tablet 2.5 mg  2.5 mg Oral Daily [name], [name], MD   2.5 mg at 12/11/17 0827  . cetirizine HCl (Zyrtec) 5 MG/5ML solution 10 mg  10 mg Oral BID PRN [name], [name], MD      . cloZAPine (CLOZARIL) tablet 150 mg  150 mg Oral QHS [name], [name], MD   150 mg at 12/10/17 2113  . hydrOXYzine (ATARAX/VISTARIL) tablet 50 mg  50 mg Oral Q6H PRN [name], [name], MD   50 mg at 11/23/17 2333  . levothyroxine (SYNTHROID, LEVOTHROID) tablet 100 mcg  100 mcg Oral QAC breakfast [name], [name], MD   100 mcg at 12/11/17 0656  . lithium  carbonate (LITHOBID) CR tablet 300 mg  300 mg Oral BID WC [name], [name], MD   300 mg at 12/11/17 0827  . magnesium hydroxide (MILK OF MAGNESIA) suspension 30 mL  30 mL Oral Daily PRN [name], [name], MD      . OXcarbazepine (TRILEPTAL) tablet 150 mg  150 mg Oral BID [name], [name], MD   150 mg at 12/11/17 4696  . pantoprazole (PROTONIX) EC tablet 40 mg  40 mg Oral Daily [name], [name], MD   40 mg at 12/11/17 2952  . polyethylene glycol (MIRALAX / GLYCOLAX) packet 17 g  17 g Oral Daily PRN [name], [name], MD   17 g at 12/10/17 1748  . senna (SENOKOT) tablet 8.6 mg  1 tablet Oral Daily PRN [name], [name], MD      . traZODone (DESYREL) tablet 50 mg  50 mg Oral QHS  [name], [name], MD      . venlafaxine XR (EFFEXOR-XR) 24 hr capsule 150 mg  150 mg Oral Daily [name], [name], MD   150 mg at 12/11/17 0827  . Vitamin D (Ergocalciferol) (DRISDOL) capsule 50,000 Units  50,000 Units Oral Weekly [name], [name], MD   50,000 Units at 12/07/17 1300     Discharge Medications: TAKE these medications     Indication  amLODipine 2.5 MG tablet Commonly known as:  NORVASC Take 1 tablet (2.5 mg total) by mouth daily.  Indication:  High Blood Pressure Disorder   cetirizine 10 MG tablet Commonly known as:  ZYRTEC Take 10 mg by mouth daily as needed for allergies.  Indication:  Hayfever   clozapine 50 MG tablet Commonly known as:  CLOZARIL Take 150 mg by mouth.  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  Indication:  Feeling Anxious   levothyroxine 100 MCG tablet Commonly known as:  SYNTHROID, LEVOTHROID Take 100 mcg by mouth daily before breakfast.  Indication:  Underactive Thyroid   lithium carbonate 300 MG capsule Take 300 mg by mouth 2 (two) times daily with a meal.  Indication:  Schizoaffective Disorder   medroxyPROGESTERone 150 MG/ML injection Commonly known as:  DEPO-PROVERA Inject 150 mg into the muscle every 3 (three) months.  Indication:  Birth Control Treatment   nitrofurantoin (macrocrystal-monohydrate) 100 MG capsule Commonly known as:  MACROBID Take 1 capsule (100 mg total) by mouth every 12 (twelve) hours.  Indication:  Urinary Tract Infection   omeprazole 20 MG capsule Commonly known as:  PRILOSEC Take 20 mg by mouth daily.  Indication:  Gastroesophageal Reflux Disease   OXcarbazepine 150 MG tablet Commonly known as:  TRILEPTAL Take 1 tablet (150 mg total) by mouth 2 (two) times daily. What changed:  how much to take  Indication:  Manic-Depression    polyethylene glycol packet Commonly known as:  MIRALAX / GLYCOLAX Take 17 g by mouth daily as needed.  Indication:  Constipation   senna 8.6 MG Tabs tablet Commonly known as:  SENOKOT Take 1 tablet by mouth daily as needed for mild constipation.  Indication:  Constipation   traZODone 50 MG tablet Commonly known as:  DESYREL Take 50 mg by mouth at bedtime.  Indication:  Trouble Sleeping   venlafaxine XR 150 MG 24 hr capsule Commonly known as:  EFFEXOR-XR Take 1 capsule (150 mg total) by mouth daily with breakfast.  Indication:  Major Depressive Disorder   Vitamin D (Ergocalciferol) 50000 units Caps capsule Commonly known as:  DRISDOL Take 50,000 Units by mouth every 7 (seven) days.  Indication:  Osteoporosis     Relevant Imaging Results:  Relevant Lab Results: PPD Skin Test given 7/23 result negative  Additional Information Allergies: Betadine, Shell fish derived products Iodine  SSN # [id number]  [name], LCSW

## 2017-12-11 NOTE — Progress Notes (Signed)
Patient denies SI/HI, denies A/V hallucinations. Patient verbalizes understanding of discharge instructions, follow up care and prescriptions. Patient given all belongings from [facility] locker. Patient escorted out by staff, transported by group home staff. 

## 2017-12-11 NOTE — Progress Notes (Signed)
Recreation Therapy Notes  INPATIENT RECREATION TR PLAN  Patient Details [name] MRN: [id number] DOB: 08-Mar-1991 Today's Date: 12/11/2017  Rec Therapy Plan Is patient appropriate for Therapeutic Recreation?: Yes Treatment times per week: at least 3 Estimated Length of Stay: 5-7 days TR Treatment/Interventions: Group participation (Comment)  Discharge Criteria Pt will be discharged from therapy if:: Discharged Treatment plan/goals/alternatives discussed and agreed upon by:: Patient/family  Discharge Summary Short term goals set: Patient will successfully identify 2 ways of making healthy decisions post d/c within 5 recreation therapy group sessions Short term goals met: Not met Progress toward goals comments: Groups attended Which groups?: Other (Comment)(Team work,Values, Happiness) Reason goals not met: Patient spent most of her time in her room Therapeutic equipment acquired: N/A Reason patient discharged from therapy: Discharge from hospital Pt/family agrees with progress & goals achieved: Yes Date patient discharged from therapy: 12/11/17   Kaeleigh Westendorf 12/11/2017, 3:58 PM

## 2017-12-11 NOTE — Plan of Care (Addendum)
Patient found in day room upon my arrival. Patient is visible and social this evening. Patient reports mood is "fine." Hygiene is improved. Speech and behavior is appropriate. Denies all complaints including SI/HI/AVH. Reports eating and voiding adequately. Denies pain. Compliant with HS medications and staff direction. Given trazodone for sleep with positive results. Q 15 minute checks maintained. Will continue to monitor throughout the shift. Patient slept 7 hours. No apparent distress. Will endorse care to oncoming shift.  Problem: Activity: Goal: Interest or engagement in leisure activities will improve Outcome: Progressing Goal: Imbalance in normal sleep/wake cycle will improve Outcome: Progressing   Problem: Coping: Goal: Coping ability will improve Outcome: Progressing Goal: Will verbalize feelings Outcome: Progressing   Problem: Health Behavior/Discharge Planning: Goal: Ability to make decisions will improve Outcome: Progressing Goal: Compliance with therapeutic regimen will improve Outcome: Progressing   Problem: Self-Concept: Goal: Level of anxiety will decrease Outcome: Progressing

## 2017-12-16 ENCOUNTER — Emergency Department
Admission: EM | Admit: 2017-12-16 | Discharge: 2017-12-16 | Disposition: A | Discharge status: Home / Self Care | Payer: Medicare Other | Attending: Emergency Medicine | Admitting: Emergency Medicine

## 2017-12-16 ENCOUNTER — Emergency Department: Payer: Medicare Other

## 2017-12-16 ENCOUNTER — Encounter: Payer: Self-pay | Admitting: Emergency Medicine

## 2017-12-16 ENCOUNTER — Other Ambulatory Visit: Payer: Self-pay

## 2017-12-16 DIAGNOSIS — N398 Other specified disorders of urinary system: Secondary | ICD-10-CM | POA: Insufficient documentation

## 2017-12-16 DIAGNOSIS — J45909 Unspecified asthma, uncomplicated: Secondary | ICD-10-CM | POA: Diagnosis not present

## 2017-12-16 DIAGNOSIS — R1030 Lower abdominal pain, unspecified: Secondary | ICD-10-CM | POA: Diagnosis present

## 2017-12-16 DIAGNOSIS — E039 Hypothyroidism, unspecified: Secondary | ICD-10-CM | POA: Diagnosis not present

## 2017-12-16 DIAGNOSIS — K625 Hemorrhage of anus and rectum: Secondary | ICD-10-CM | POA: Diagnosis not present

## 2017-12-16 DIAGNOSIS — K5900 Constipation, unspecified: Secondary | ICD-10-CM

## 2017-12-16 DIAGNOSIS — Z87891 Personal history of nicotine dependence: Secondary | ICD-10-CM | POA: Insufficient documentation

## 2017-12-16 DIAGNOSIS — I10 Essential (primary) hypertension: Secondary | ICD-10-CM | POA: Diagnosis not present

## 2017-12-16 DIAGNOSIS — R309 Painful micturition, unspecified: Secondary | ICD-10-CM | POA: Diagnosis not present

## 2017-12-16 DIAGNOSIS — Z79899 Other long term (current) drug therapy: Secondary | ICD-10-CM | POA: Diagnosis not present

## 2017-12-16 DIAGNOSIS — N938 Other specified abnormal uterine and vaginal bleeding: Secondary | ICD-10-CM

## 2017-12-16 LAB — COMPREHENSIVE METABOLIC PANEL
ALBUMIN: 4.2 g/dL (ref 3.5–5.0)
ALT: 10 U/L (ref 0–44)
ANION GAP: 3 — AB (ref 5–15)
AST: 17 U/L (ref 15–41)
Alkaline Phosphatase: 76 U/L (ref 38–126)
BUN: 11 mg/dL (ref 6–20)
CALCIUM: 8.9 mg/dL (ref 8.9–10.3)
CHLORIDE: 113 mmol/L — AB (ref 98–111)
CO2: 23 mmol/L (ref 22–32)
Creatinine, Ser: 0.76 mg/dL (ref 0.44–1.00)
GFR calc non Af Amer: >60 mL/min (ref >60)
GLUCOSE: 93 mg/dL (ref 70–99)
Potassium: 4 mmol/L (ref 3.5–5.1)
Sodium: 139 mmol/L (ref 135–145)
TOTAL PROTEIN: 6.8 g/dL (ref 6.5–8.1)
Total Bilirubin: 0.4 mg/dL (ref 0.3–1.2)

## 2017-12-16 LAB — CHLAMYDIA/NGC RT PCR (ARMC ONLY)
Chlamydia Tr: NOT DETECTED
N gonorrhoeae: NOT DETECTED

## 2017-12-16 LAB — WET PREP, GENITAL
CLUE CELLS WET PREP: NONE SEEN
Sperm: NONE SEEN
TRICH WET PREP: NONE SEEN
YEAST WET PREP: NONE SEEN

## 2017-12-16 LAB — URINALYSIS, COMPLETE (UACMP) WITH MICROSCOPIC
Bilirubin Urine: NEGATIVE
GLUCOSE, UA: NEGATIVE mg/dL
KETONES UR: 5 mg/dL — AB
Leukocytes, UA: NEGATIVE
Nitrite: NEGATIVE
PROTEIN: NEGATIVE mg/dL
RBC / HPF: >50 RBC/hpf — ABNORMAL HIGH (ref 0–5)
Specific Gravity, Urine: 1.021 (ref 1.005–1.030)
pH: 6 (ref 5.0–8.0)

## 2017-12-16 LAB — CBC
HEMATOCRIT: 35.4 % (ref 35.0–47.0)
Hemoglobin: 12.2 g/dL (ref 12.0–16.0)
MCH: 31.2 pg (ref 26.0–34.0)
MCHC: 34.3 g/dL (ref 32.0–36.0)
MCV: 90.9 fL (ref 80.0–100.0)
Platelets: 215 10*3/uL (ref 150–440)
RBC: 3.9 MIL/uL (ref 3.80–5.20)
RDW: 13.7 % (ref 11.5–14.5)
WBC: 7.6 10*3/uL (ref 3.6–11.0)

## 2017-12-16 LAB — LIPASE, BLOOD: Lipase: 27 U/L (ref 11–51)

## 2017-12-16 LAB — POCT PREGNANCY, URINE: PREG TEST UR: NEGATIVE

## 2017-12-16 IMAGING — CT CT ABD-PELV W/O CM
2 of 4 series · 16 of 46 positions shown, 18 images · non-contrast
Comparison: [DATE]

CLINICAL DATA: Acute abdominal pain and hematochezia beginning this
morning.

EXAM:
CT ABDOMEN AND PELVIS WITHOUT CONTRAST
TECHNIQUE: Multidetector CT imaging of the abdomen and pelvis was performed
following the standard protocol without IV contrast.

[Series 2: routine abd/pel wo · axial · 0.82mm/px · z∈[-504,-74]mm · 13 of 96 slices shown, 15 images]
[im 5/96  soft-tissue]
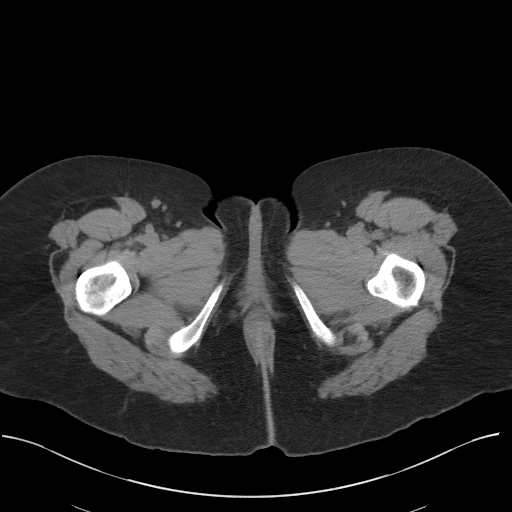
[im 5/96  bone]
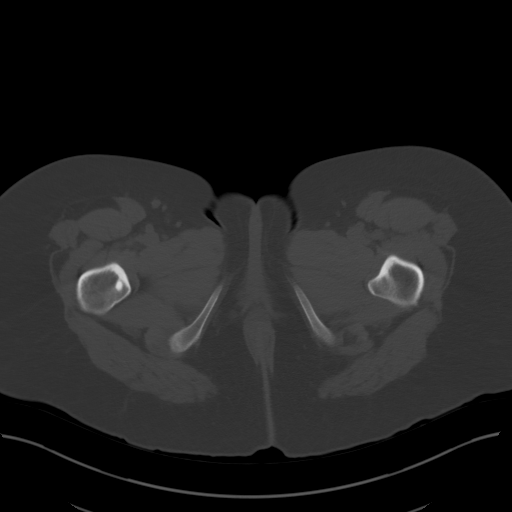
[im 13/96  soft-tissue]
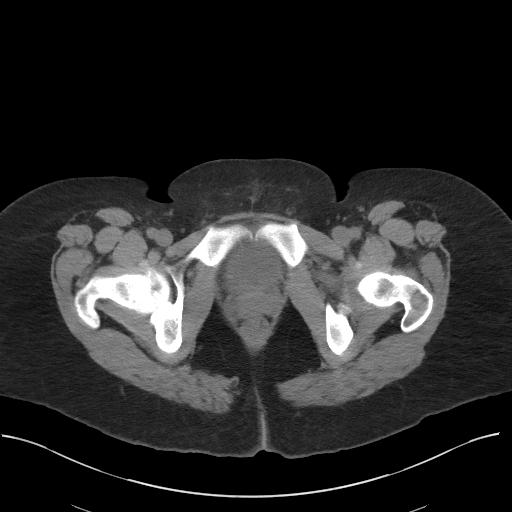
[im 21/96  soft-tissue]
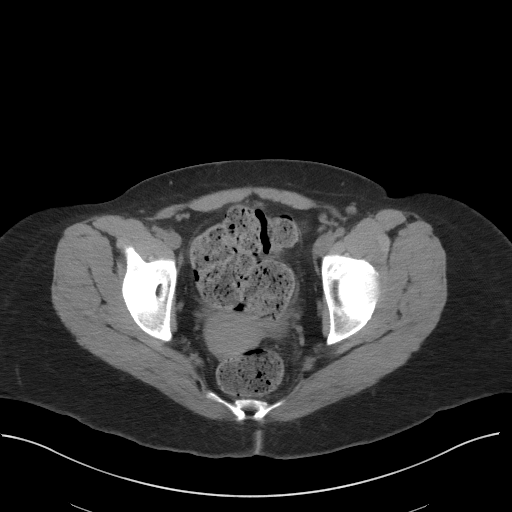
[im 25/96  soft-tissue]
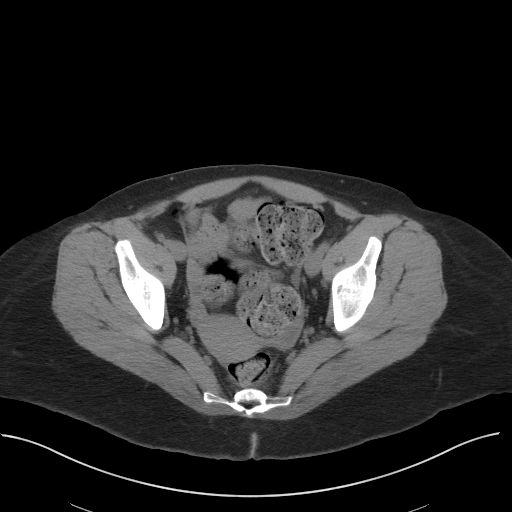
[im 34/96  soft-tissue]
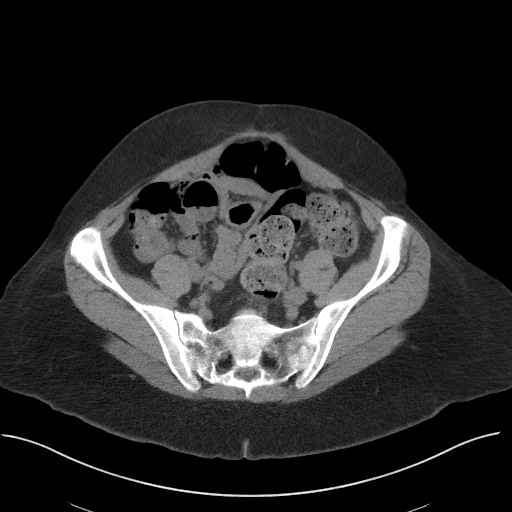
[im 42/96  soft-tissue]
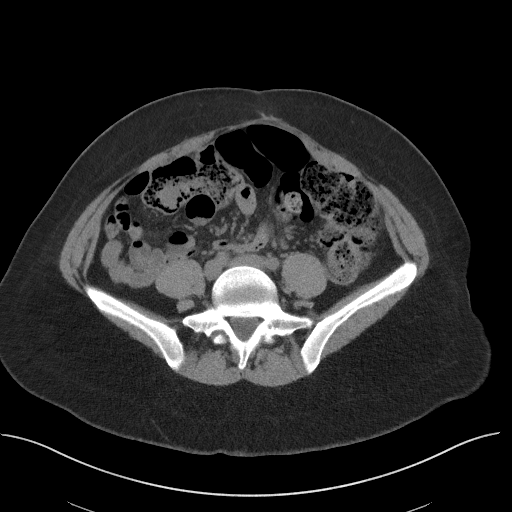
[im 50/96  soft-tissue]
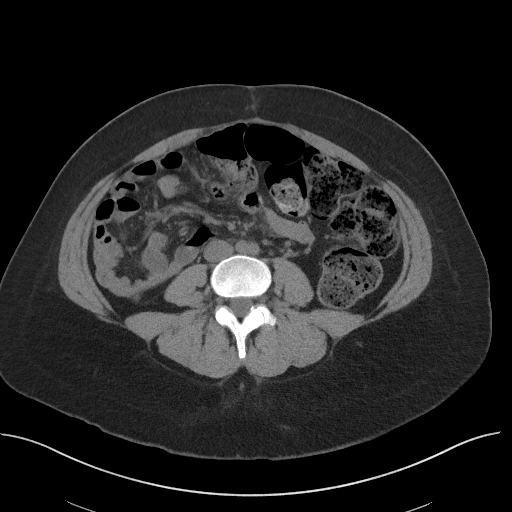
[im 54/96  soft-tissue]
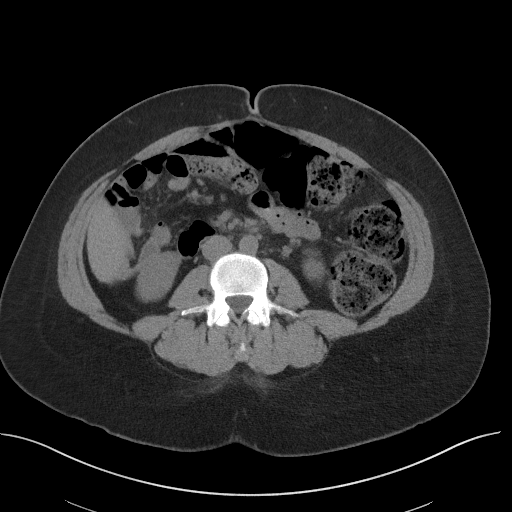
[im 62/96  soft-tissue]
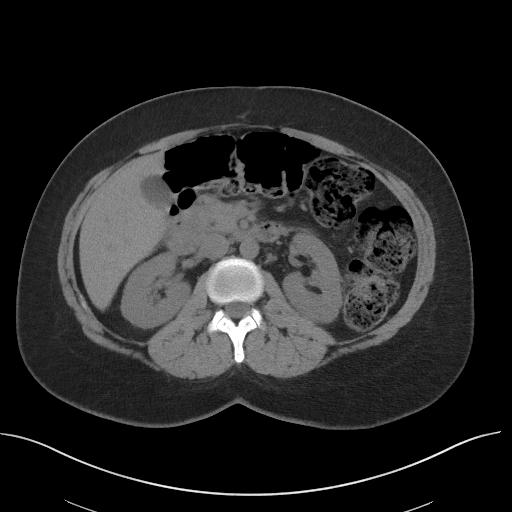
[im 62/96  bone]
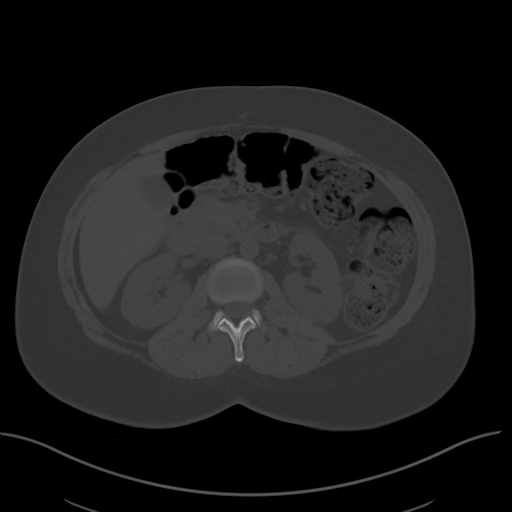
[im 71/96  soft-tissue]
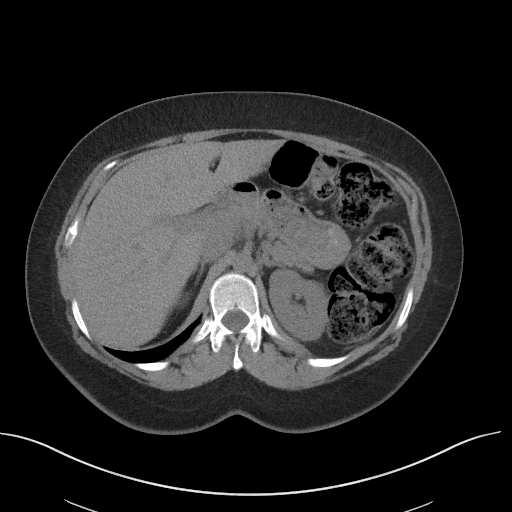
[im 75/96  soft-tissue]
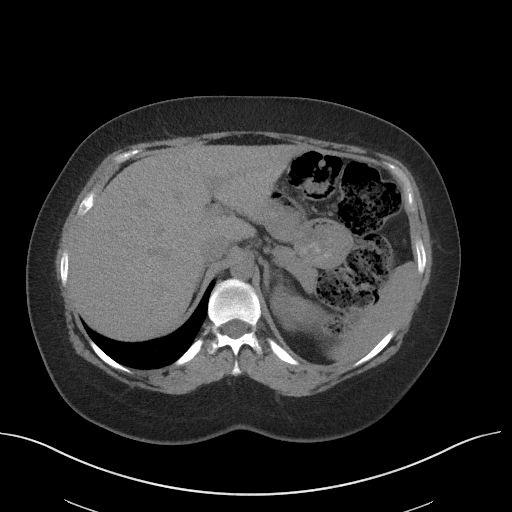
[im 83/96  soft-tissue]
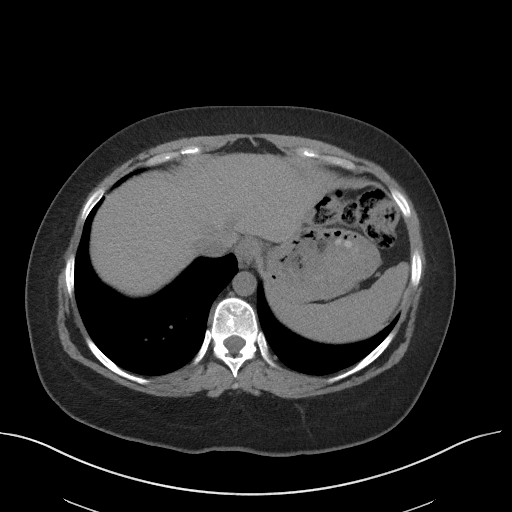
[im 91/96  soft-tissue]
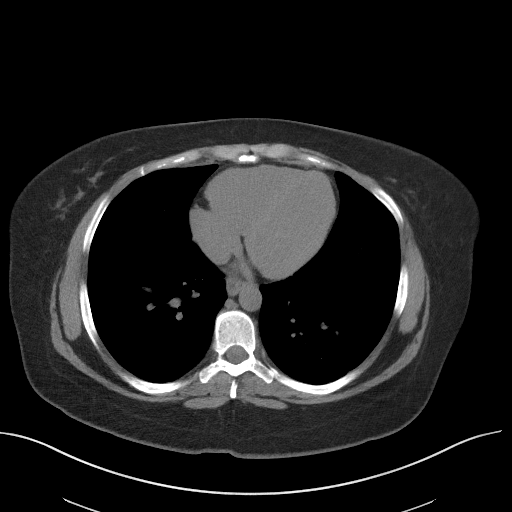

[Series 5: coronal st · coronal · 0.79mm/px · 3 of 97 slices shown]
[im 33/97  soft-tissue]
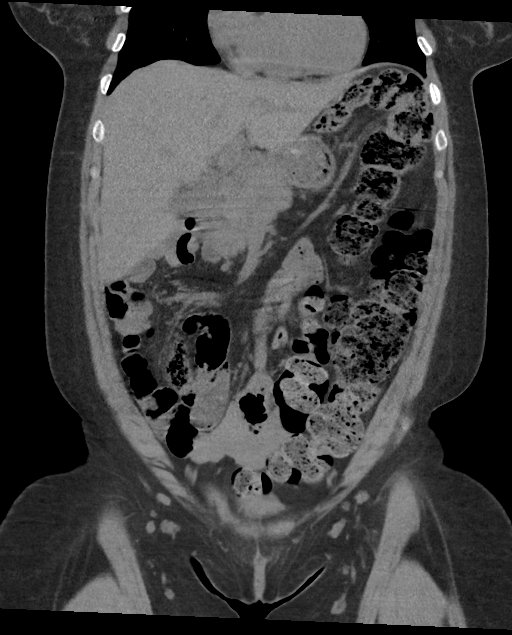
[im 43/97  soft-tissue]
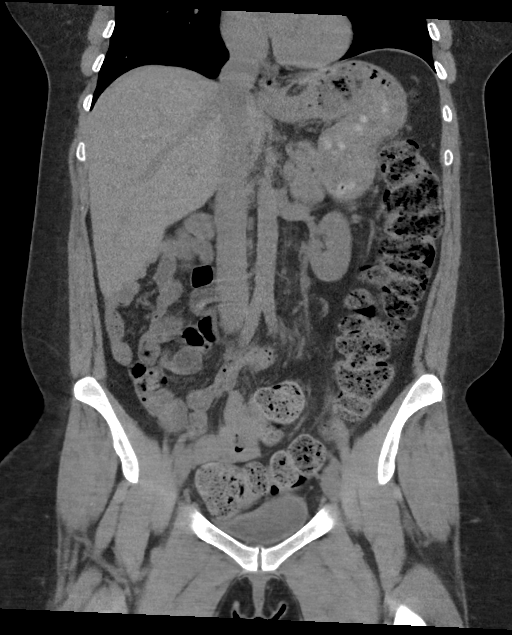
[im 54/97  soft-tissue]
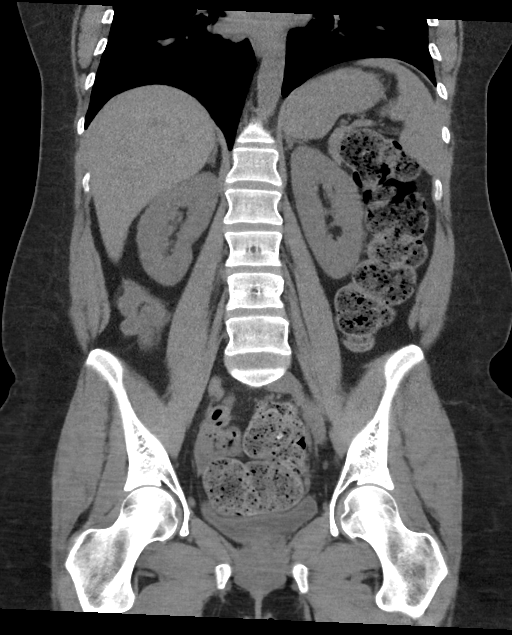

[16 of 46 positions shown; findings below may reference images not displayed]

FINDINGS: Lower chest: No acute findings.

Hepatobiliary: No mass visualized on this unenhanced exam.
Gallbladder is unremarkable.

Pancreas: No mass or inflammatory process visualized on this
unenhanced exam.

Spleen:  Within normal limits in size.

Adrenals/Urinary tract: No evidence of urolithiasis or
hydronephrosis. Unremarkable unopacified urinary bladder.

Stomach/Bowel: No evidence of obstruction, inflammatory process, or
abnormal fluid collections. A large amount of colonic stool is
noted.

Vascular/Lymphatic: No pathologically enlarged lymph nodes
identified. No evidence of abdominal aortic aneurysm.

Reproductive:  No mass or other significant abnormality.

Other:  None.

Musculoskeletal: No suspicious bone lesions identified. Bilateral L5
pars defects incidentally noted, without evidence of
spondylolisthesis. Transitional lumbosacral vertebra at S1.
IMPRESSION: No acute findings.

Large stool burden noted; recommend clinical correlation for
possible constipation.

## 2017-12-16 MED ORDER — DOCUSATE SODIUM 100 MG PO CAPS: 100.0000 mg | ORAL_CAPSULE | Freq: Every day | ORAL | 0 refills | Status: AC | PRN

## 2017-12-16 NOTE — ED Notes (Signed)
Pt is AOx4, vss, in bed with rails upx2 and resting. Pt c/o abdominal pain but denies N/V/D. We will continue to monitor the pt.
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
## 2017-12-16 NOTE — ED Notes (Signed)
No peripheral IV placed this visit.   Discharge instructions reviewed with patient. Questions fielded by this RN. Patient verbalizes understanding of instructions. Patient discharged home in stable condition per [place]. No acute distress noted at time of discharge.   Caregiver signed for DC

## 2017-12-16 NOTE — ED Triage Notes (Addendum)
PT to ED via EMS from [facility] place with c/o abd pain and having a bloody stool this am, describes blood as bright. States until today LBM was x4days ago. PT in NAD, VSS

## 2017-12-16 NOTE — ED Notes (Signed)
PT mother is legal guardian, attempted to contact at this time with no success.

## 2017-12-16 NOTE — ED Notes (Signed)
Called group home twice, someone answered second time; staff says someone has been called to come get her but she will call them again;

## 2017-12-16 NOTE — ED Notes (Signed)
Pt sleeping with even and unlabored respirations.

## 2017-12-16 NOTE — ED Notes (Signed)
[phone], group home called, dispatching ride home for pt, reports will arrive within the hour

## 2017-12-31 ENCOUNTER — Other Ambulatory Visit: Payer: Self-pay

## 2017-12-31 ENCOUNTER — Emergency Department: Payer: Medicare Other

## 2017-12-31 ENCOUNTER — Emergency Department
Admission: EM | Admit: 2017-12-31 | Discharge: 2018-01-01 | Disposition: A | Discharge status: Home / Self Care | Payer: Medicare Other | Attending: Emergency Medicine | Admitting: Emergency Medicine

## 2017-12-31 ENCOUNTER — Encounter: Payer: Self-pay | Admitting: Emergency Medicine

## 2017-12-31 DIAGNOSIS — T1491XA Suicide attempt, initial encounter: Secondary | ICD-10-CM

## 2017-12-31 DIAGNOSIS — E039 Hypothyroidism, unspecified: Secondary | ICD-10-CM | POA: Insufficient documentation

## 2017-12-31 DIAGNOSIS — Z79899 Other long term (current) drug therapy: Secondary | ICD-10-CM | POA: Insufficient documentation

## 2017-12-31 DIAGNOSIS — F329 Major depressive disorder, single episode, unspecified: Secondary | ICD-10-CM | POA: Insufficient documentation

## 2017-12-31 DIAGNOSIS — F259 Schizoaffective disorder, unspecified: Secondary | ICD-10-CM | POA: Insufficient documentation

## 2017-12-31 DIAGNOSIS — R440 Auditory hallucinations: Secondary | ICD-10-CM

## 2017-12-31 DIAGNOSIS — E785 Hyperlipidemia, unspecified: Secondary | ICD-10-CM | POA: Insufficient documentation

## 2017-12-31 DIAGNOSIS — I10 Essential (primary) hypertension: Secondary | ICD-10-CM | POA: Insufficient documentation

## 2017-12-31 DIAGNOSIS — J45909 Unspecified asthma, uncomplicated: Secondary | ICD-10-CM | POA: Insufficient documentation

## 2017-12-31 DIAGNOSIS — T528X2A Toxic effect of other organic solvents, intentional self-harm, initial encounter: Secondary | ICD-10-CM | POA: Insufficient documentation

## 2017-12-31 DIAGNOSIS — Z87891 Personal history of nicotine dependence: Secondary | ICD-10-CM | POA: Insufficient documentation

## 2017-12-31 LAB — COMPREHENSIVE METABOLIC PANEL
ALK PHOS: 76 U/L (ref 38–126)
ALT: 9 U/L (ref 0–44)
AST: 17 U/L (ref 15–41)
Albumin: 3.9 g/dL (ref 3.5–5.0)
Anion gap: 7 (ref 5–15)
BUN: 7 mg/dL (ref 6–20)
CALCIUM: 9 mg/dL (ref 8.9–10.3)
CO2: 22 mmol/L (ref 22–32)
CREATININE: 0.77 mg/dL (ref 0.44–1.00)
Chloride: 113 mmol/L — ABNORMAL HIGH (ref 98–111)
GFR calc Af Amer: >60 mL/min (ref >60)
GFR calc non Af Amer: >60 mL/min (ref >60)
Glucose, Bld: 102 mg/dL — ABNORMAL HIGH (ref 70–99)
Potassium: 3.3 mmol/L — ABNORMAL LOW (ref 3.5–5.1)
SODIUM: 142 mmol/L (ref 135–145)
Total Bilirubin: 0.6 mg/dL (ref 0.3–1.2)
Total Protein: 6.5 g/dL (ref 6.5–8.1)

## 2017-12-31 LAB — CBC WITH DIFFERENTIAL/PLATELET
BASOS ABS: 0 10*3/uL (ref 0–0.1)
Basophils Relative: 1 %
EOS ABS: 0.1 10*3/uL (ref 0–0.7)
EOS PCT: 1 %
HCT: 35.9 % (ref 35.0–47.0)
HEMOGLOBIN: 12.3 g/dL (ref 12.0–16.0)
LYMPHS ABS: 2 10*3/uL (ref 1.0–3.6)
LYMPHS PCT: 21 %
MCH: 30.9 pg (ref 26.0–34.0)
MCHC: 34.2 g/dL (ref 32.0–36.0)
MCV: 90.5 fL (ref 80.0–100.0)
Monocytes Absolute: 0.7 10*3/uL (ref 0.2–0.9)
Monocytes Relative: 8 %
NEUTROS PCT: 69 %
Neutro Abs: 6.6 10*3/uL — ABNORMAL HIGH (ref 1.4–6.5)
PLATELETS: 192 10*3/uL (ref 150–440)
RBC: 3.97 MIL/uL (ref 3.80–5.20)
RDW: 13.9 % (ref 11.5–14.5)
WBC: 9.4 10*3/uL (ref 3.6–11.0)

## 2017-12-31 LAB — URINALYSIS, COMPLETE (UACMP) WITH MICROSCOPIC
BACTERIA UA: NONE SEEN
Bilirubin Urine: NEGATIVE
Glucose, UA: NEGATIVE mg/dL
Ketones, ur: 5 mg/dL — AB
Leukocytes, UA: NEGATIVE
Nitrite: NEGATIVE
PROTEIN: NEGATIVE mg/dL
Specific Gravity, Urine: 1.003 — ABNORMAL LOW (ref 1.005–1.030)
pH: 8 (ref 5.0–8.0)

## 2017-12-31 LAB — SALICYLATE LEVEL

## 2017-12-31 LAB — ETHANOL

## 2017-12-31 LAB — ACETAMINOPHEN LEVEL

## 2017-12-31 LAB — LITHIUM LEVEL: LITHIUM LVL: 0.55 mmol/L — AB (ref 0.60–1.20)

## 2017-12-31 IMAGING — CR DG CHEST 1V
1 series · 1 of 1 positions shown · non-contrast
Comparison: [DATE]

CLINICAL DATA: Acetone ingestion as reported attempted suicide

EXAM:
CHEST  1 VIEW

[dg chest 1 view]
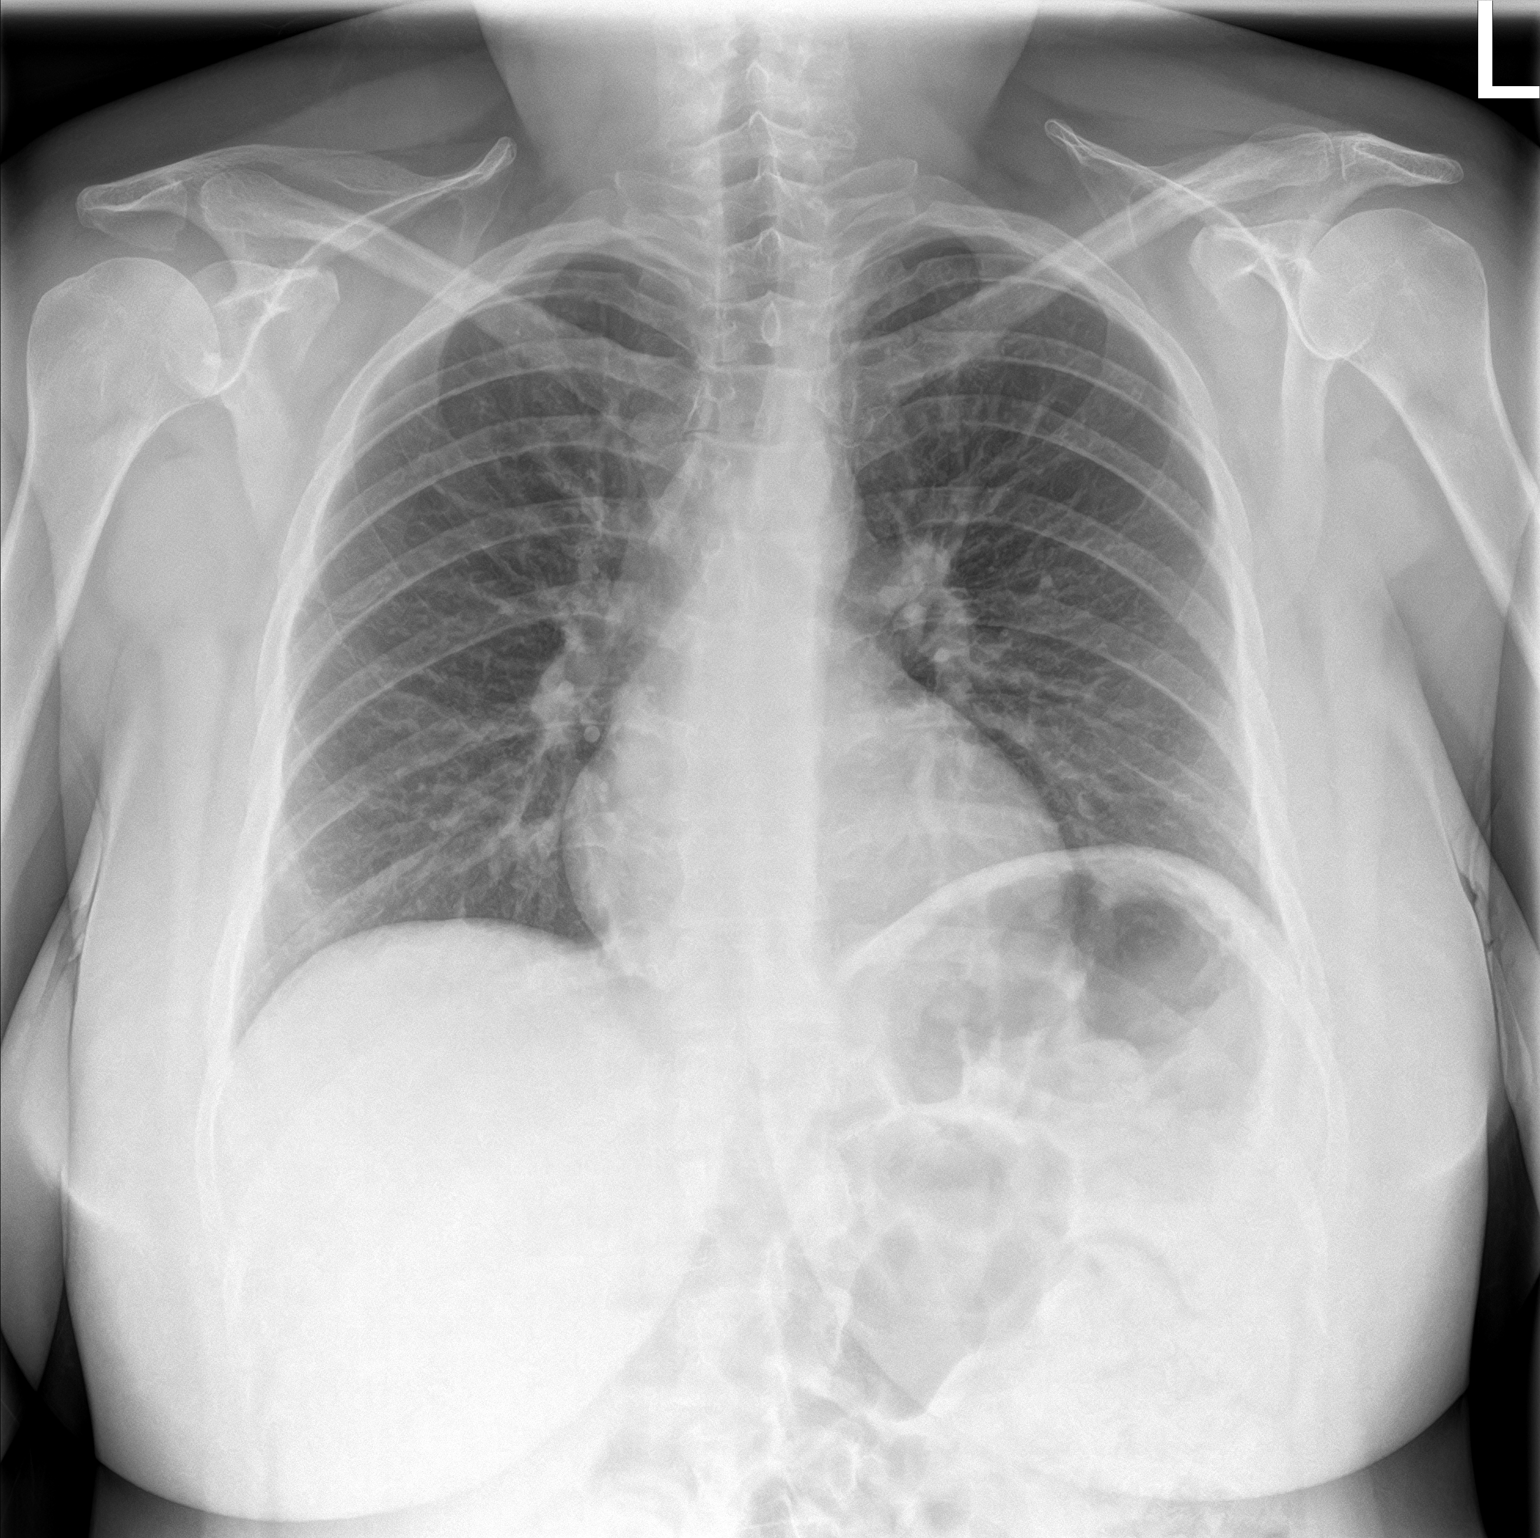

[1 of 1 positions shown; findings below may reference images not displayed]

FINDINGS: Lungs are clear. Heart size and pulmonary vascularity are normal. No
pneumomediastinum or pneumothorax. Trachea appears normal. No bone
lesions.
IMPRESSION: No edema or consolidation.

## 2017-12-31 IMAGING — CR DG ABDOMEN 1V
1 series · 2 of 2 positions shown · non-contrast
Comparison: CT abdomen and pelvis [DATE]

CLINICAL DATA: Acetone ingestion or reported attempted suicide

EXAM:
ABDOMEN - 1 VIEW

[Series 1: dg abd 1 view · 0.14mm/px · 2 of 2 slices shown]
[im 1/2]
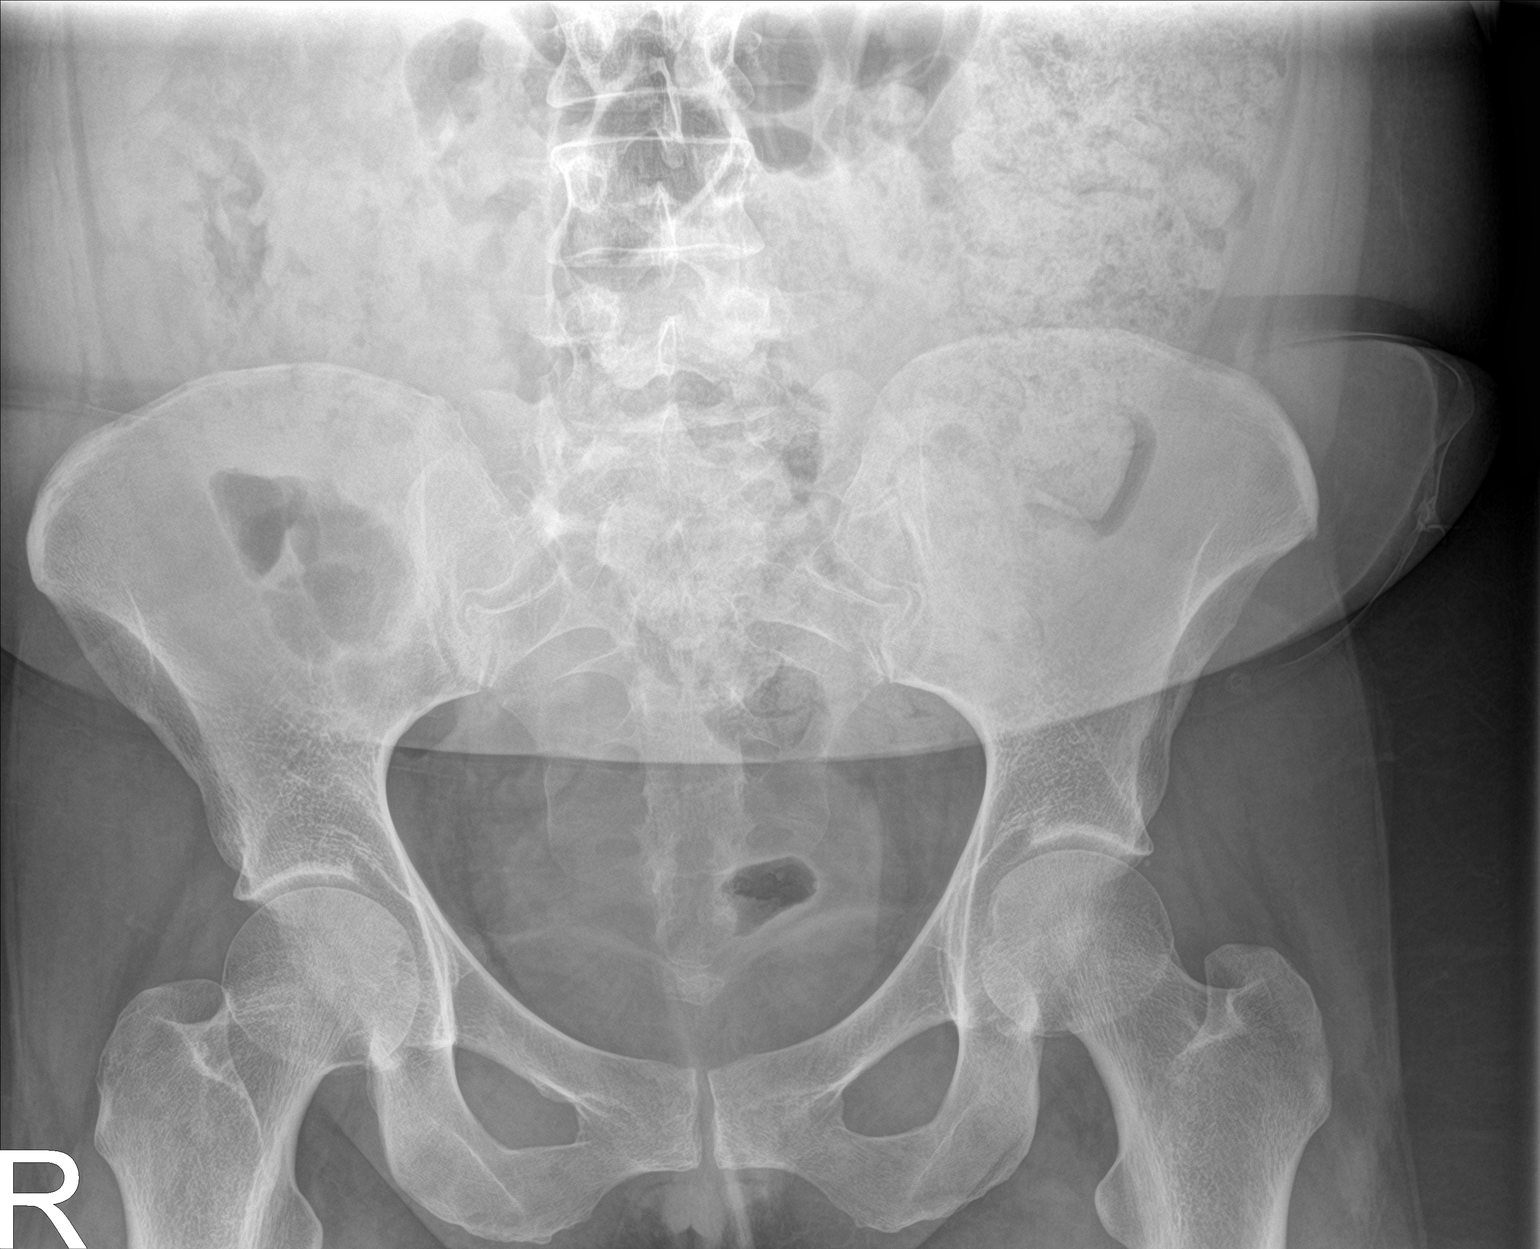
[im 2/2]
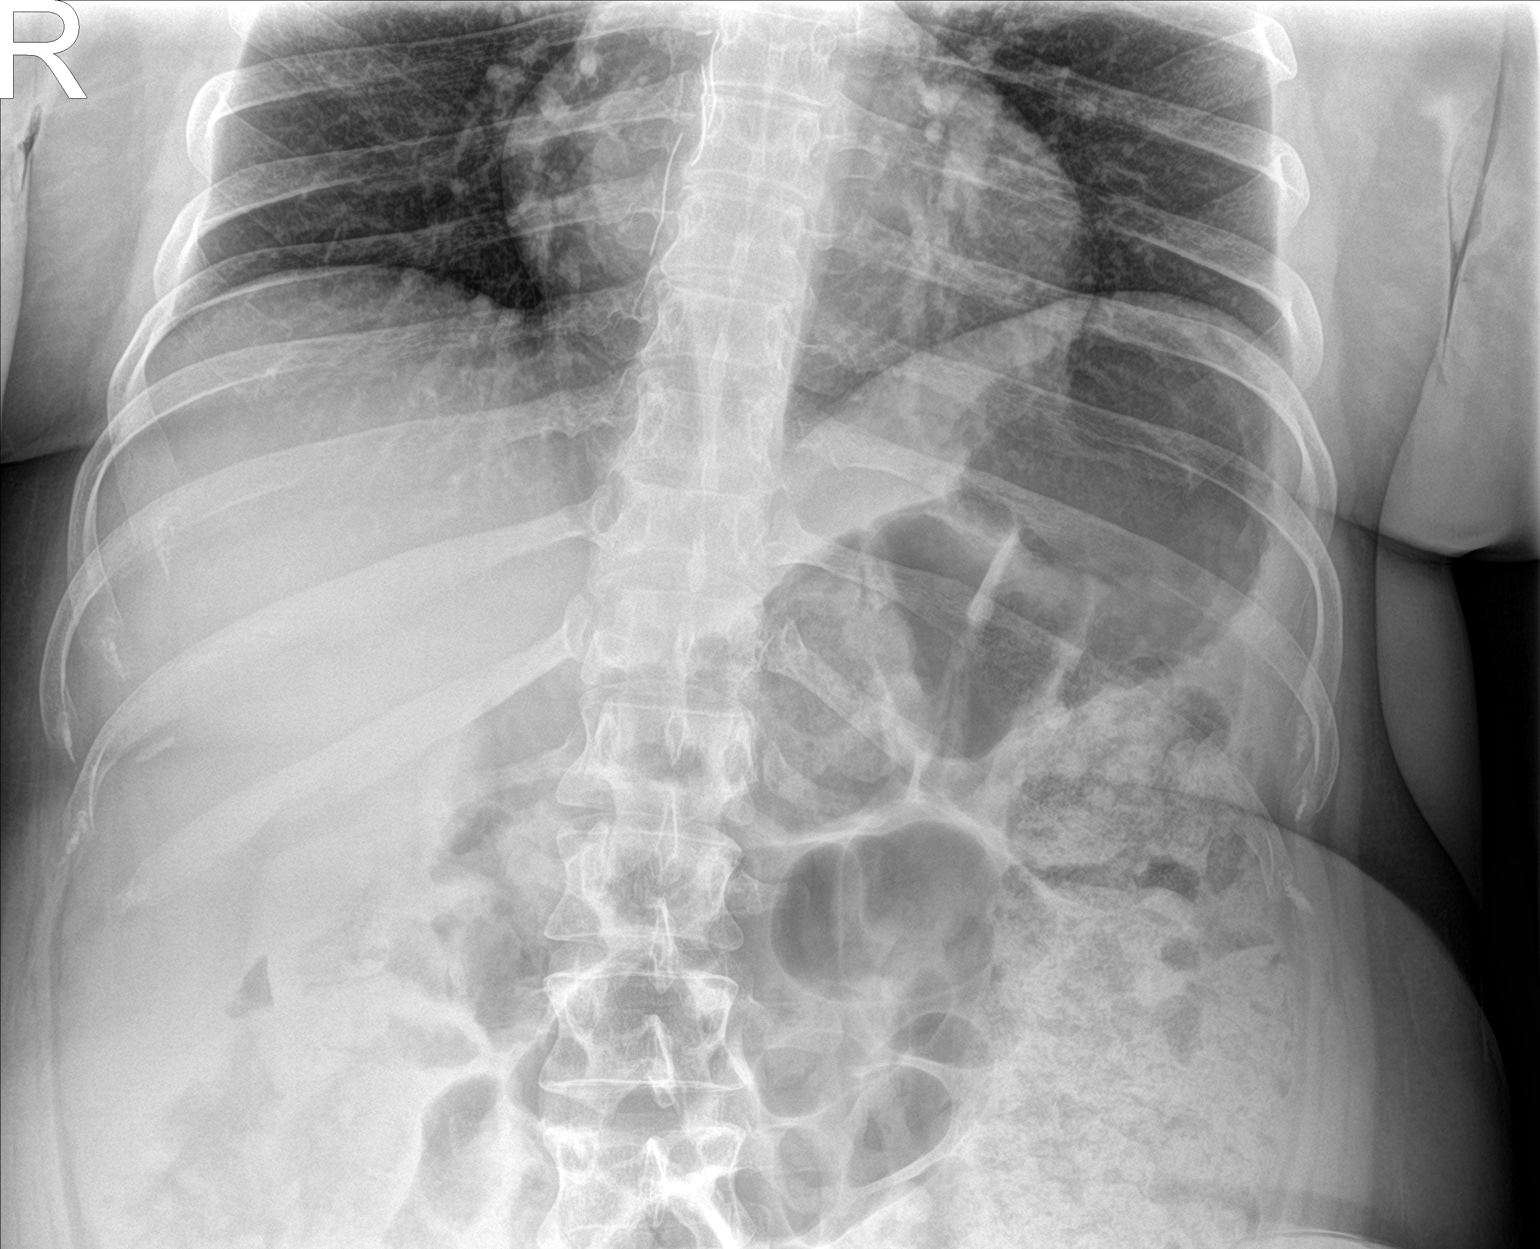

[2 of 2 positions shown; findings below may reference images not displayed]

FINDINGS: There is diffuse stool throughout much of the colon. There is no
bowel dilatation or air-fluid level to suggest bowel obstruction. No
free air. No abnormal calcifications.
IMPRESSION: Diffuse stool throughout much of colon. No bowel obstruction or free
air evident.

## 2017-12-31 MED ORDER — ONDANSETRON HCL 4 MG/2ML IJ SOLN
4.0000 mg | Freq: Once | INTRAMUSCULAR | Status: AC
  Administered 2017-12-31: 4 mg via INTRAVENOUS
  Filled 2017-12-31: qty 2

## 2017-12-31 MED ORDER — SODIUM CHLORIDE 0.9 % IV BOLUS
1000.0000 mL | Freq: Once | INTRAVENOUS | Status: AC
  Administered 2017-12-31: 1000 mL via INTRAVENOUS

## 2017-12-31 NOTE — ED Notes (Signed)
Soc is complete. Pt will be medically cleared at 1130pm from drinking the acetone nail polish remover. Pt has been quiet and cooperative. Mostly sleeping but easily arousable.

## 2017-12-31 NOTE — ED Provider Notes (Signed)
[facility] Emergency Department Provider Note  ___________________________________________   First MD Initiated Contact with Patient 12/31/17 1907     (approximate)  I have reviewed the triage vital signs and the nursing notes.   HISTORY  Chief Complaint Drug Overdose and Suicidal   HPI Melanie Bell is a 27 y.o. female a history of depression, schizoaffective disorder as well as GERD who is presenting to the emergency department today after an intentional ingestion of nail polish remover.  She drank approximately 90 mL's at about 5 or 5:30 PM this is about an hour and 1/2 to 2 hours prior to arrival to the hospital.  She states that she drank this in an attempt to kill herself because she was hearing voices that told her she should not be alive anymore.  She is having burning abdominal pain at this time.  Brought in by EMS.  Poison control was contacted by EMS.  Saline started by EMS through the IV.   Past Medical History:  Diagnosis Date  . Anxiety   . Asthma   . Depression   . GERD (gastroesophageal reflux disease)   . Hallucinations 09/30/2014   Sizoaffective  . Hyperlipidemia   . Hypertension   . Intentional ingestion of batteries 04/21/2016    2 AAA batteries and 1 thumb tack; intent to hurt herself/notes 04/21/2016  . Tardive dyskinesia 10/2014   recent onset    Patient Active Problem List   Diagnosis Date Noted  . Self-inflicted injury 34/19/3790  . ASCUS of cervix with negative high risk HPV 08/28/2017  . Malingering 05/06/2016  . Foreign body aspiration   . Other foreign object in esophagus causing other injury, subsequent encounter   . Swallowed foreign body 04/23/2016  . Foreign body ingestion 04/21/2016  . Gastric foreign body   . Breast tumor 03/05/2016  . Schizoaffective disorder, bipolar type ([place]) 11/06/2014  . Tobacco use disorder 09/30/2014  . Hypothyroidism 09/29/2014  . Hypertension 09/29/2014    Past Surgical  History:  Procedure Laterality Date  . ABDOMINAL SURGERY     "years ago" to remove foreign objects  . APPENDECTOMY    . BREAST EXCISIONAL BIOPSY Right 09/03/2007   surgical bx removed fibroadeoma  . BREAST LUMPECTOMY Right   . COLONOSCOPY WITH PROPOFOL N/A 09/10/2015   Procedure: COLONOSCOPY WITH PROPOFOL;  Surgeon: [name], MD;  Location: [facility] ENDOSCOPY;  Service: Endoscopy;  Laterality: N/A;  . ESOPHAGOGASTRODUODENOSCOPY N/A 11/28/2014   Procedure: ESOPHAGOGASTRODUODENOSCOPY (EGD);  Surgeon: [name], MD;  Location: [facility] ENDOSCOPY;  Service: Endoscopy;  Laterality: N/A;  . ESOPHAGOGASTRODUODENOSCOPY N/A 02/21/2016   Procedure: ESOPHAGOGASTRODUODENOSCOPY (EGD);  Surgeon: Mauri Pole, MD;  Location: [facility] ENDOSCOPY;  Service: Endoscopy;  Laterality: N/A;  . ESOPHAGOGASTRODUODENOSCOPY N/A 04/21/2016   Procedure: ESOPHAGOGASTRODUODENOSCOPY (EGD);  Surgeon: Gatha Mayer, MD;  Location: [facility] ENDOSCOPY;  Service: Endoscopy;  Laterality: N/A;  . ESOPHAGOGASTRODUODENOSCOPY N/A 05/06/2016   Procedure: ESOPHAGOGASTRODUODENOSCOPY (EGD);  Surgeon: Jonathon Bellows, MD;  Location: [facility] ENDOSCOPY;  Service: Gastroenterology;  Laterality: N/A;  . ESOPHAGOGASTRODUODENOSCOPY N/A 06/13/2016   Procedure: ESOPHAGOGASTRODUODENOSCOPY (EGD);  Surgeon: [name], MD;  Location: [facility] ENDOSCOPY;  Service: Endoscopy;  Laterality: N/A;  . ESOPHAGOGASTRODUODENOSCOPY (EGD) WITH PROPOFOL N/A 02/29/2016   Procedure: ESOPHAGOGASTRODUODENOSCOPY (EGD) WITH PROPOFOL;  Surgeon: [name], MD;  Location: [facility] ENDOSCOPY;  Service: Endoscopy;  Laterality: N/A;  . LAPAROTOMY N/A 09/12/2015   Procedure: EXPLORATORY LAPAROTOMY;  Surgeon: Florene Glen, MD;  Location: [facility] ORS;  Service: General;  Laterality:  N/A;  . SIGMOIDOSCOPY N/A 09/12/2015   Procedure: SIGMOIDOSCOPY;  Surgeon: Florene Glen, MD;  Location: [facility] ORS;  Service: General;  Laterality: N/A;  . WISDOM TOOTH EXTRACTION      Prior to Admission  medications   Medication Sig Start Date End Date Taking? Authorizing Provider  amLODipine (NORVASC) 2.5 MG tablet Take 1 tablet (2.5 mg total) by mouth daily. 12/11/17   [name], Herma Ard B, MD  cetirizine (ZYRTEC) 10 MG tablet Take 1 tablet (10 mg total) by mouth daily as needed for allergies. 12/11/17   [name], Herma Ard B, MD  clozapine (CLOZARIL) 50 MG tablet Take 3 tablets (150 mg total) by mouth at bedtime. Patient taking differently: Take 100 mg by mouth at bedtime.  12/11/17   [name], Herma Ard B, MD  docusate sodium (COLACE) 100 MG capsule Take 1 capsule (100 mg total) by mouth daily as needed. 12/16/17 12/16/18  Orbie Pyo, MD  hydrOXYzine (ATARAX/VISTARIL) 50 MG tablet Take 1 tablet (50 mg total) by mouth 2 (two) times daily as needed. 12/11/17   [name], Wardell Honour, MD  levothyroxine (SYNTHROID, LEVOTHROID) 100 MCG tablet Take 1 tablet (100 mcg total) by mouth daily before breakfast. 12/11/17   [name], [name], MD  lithium carbonate 300 MG capsule Take 1 capsule (300 mg total) by mouth 2 (two) times daily with a meal. 12/11/17   [name], [name], MD  medroxyPROGESTERone (DEPO-PROVERA) 150 MG/ML injection Inject 1 mL (150 mg total) into the muscle every 3 (three) months. 12/11/17   [name], [name], MD  omeprazole (PRILOSEC) 20 MG capsule Take 1 capsule (20 mg total) by mouth daily. 12/11/17   [name], [name], MD  OXcarbazepine (TRILEPTAL) 300 MG tablet Take 1 tablet (300 mg total) by mouth 2 (two) times daily. 12/11/17   [name], [name], MD  polyethylene glycol (MIRALAX / GLYCOLAX) packet Take 17 g by mouth daily as needed. 12/11/17   [name], [name], MD  prazosin (MINIPRESS) 1 MG capsule Take 1 mg by mouth at bedtime.    [provider]  senna (SENOKOT) 8.6 MG TABS tablet Take 1 tablet (8.6 mg total) by mouth daily as needed for mild constipation. 12/11/17   [name], Herma Ard B, MD  traZODone (DESYREL) 50 MG tablet Take 1 tablet (50 mg  total) by mouth at bedtime. 12/11/17   [name], Herma Ard B, MD  venlafaxine XR (EFFEXOR-XR) 150 MG 24 hr capsule Take 1 capsule (150 mg total) by mouth daily with breakfast. 12/11/17   [name], [name], MD  Vitamin D, Ergocalciferol, (DRISDOL) 50000 units CAPS capsule Take 1 capsule (50,000 Units total) by mouth every 7 (seven) days. 12/11/17   [name], Wardell Honour, MD    Allergies Betadine [povidone iodine]; Iodine; and Shellfish-derived products  Family History  Problem Relation Age of Onset  . Depression Mother   . Hypertension Mother   . Sleep apnea Mother   . Asthma Mother   . COPD Mother   . Diabetes Mother   . Breast cancer Maternal Aunt        20's    Social History Social History   Tobacco Use  . Smoking status: Former Smoker    Packs/day: 0.50    Years: 3.00    Pack years: 1.50    Types: Cigarettes    Last attempt to quit: 08/2016    Years since quitting: 1.3  . Smokeless tobacco: Never Used  Substance Use Topics  . Alcohol use: No  . Drug use: No  Review of Systems  Constitutional: No fever/chills Eyes: No visual changes. ENT: No sore throat. Cardiovascular: Denies chest pain. Respiratory: Denies shortness of breath. Gastrointestinal: no vomiting.  No diarrhea.  No constipation. Genitourinary: Negative for dysuria. Musculoskeletal: Negative for back pain. Skin: Negative for rash. Neurological: Negative for headaches, focal weakness or numbness.   ____________________________________________   PHYSICAL EXAM:  VITAL SIGNS: ED Triage Vitals [12/31/17 1905]  Enc Vitals Group     BP 109/66     Pulse Rate 83     Resp 16     Temp 98.2 F (36.8 C)     Temp Source Oral     SpO2 100 %     Weight      Height      Head Circumference      Peak Flow      Pain Score      Pain Loc      Pain Edu?      Excl. in [place]?     Constitutional: Alert and oriented.  Patient appears uncomfortable.  Slightly diaphoretic. Eyes: Conjunctivae are normal.    Head: Atraumatic. Nose: No congestion/rhinnorhea. Mouth/Throat: Mucous membranes are moist.  Neck: No stridor.   Cardiovascular: Normal rate, regular rhythm. Grossly normal heart sounds.   Respiratory: Normal respiratory effort.  No retractions. Lungs CTAB. Gastrointestinal: Soft with mild and diffuse tenderness to palpation.  No distention. No CVA tenderness. Musculoskeletal: No lower extremity tenderness nor edema.  No joint effusions. Neurologic:  Normal speech and language. No gross focal neurologic deficits are appreciated. Skin:  Skin is warm, dry and intact. No rash noted. Psychiatric: Patient is tearful. ____________________________________________   LABS (all labs ordered are listed, but only abnormal results are displayed)  Labs Reviewed  LITHIUM LEVEL - Abnormal; Notable for the following components:      Result Value   Lithium Lvl 0.55 (*)    All other components within normal limits  CBC WITH DIFFERENTIAL/PLATELET - Abnormal; Notable for the following components:   Neutro Abs 6.6 (*)    All other components within normal limits  COMPREHENSIVE METABOLIC PANEL - Abnormal; Notable for the following components:   Potassium 3.3 (*)    Chloride 113 (*)    Glucose, Bld 102 (*)    All other components within normal limits  URINALYSIS, COMPLETE (UACMP) WITH MICROSCOPIC - Abnormal; Notable for the following components:   Color, Urine STRAW (*)    APPearance CLEAR (*)    Specific Gravity, Urine 1.003 (*)    Hgb urine dipstick MODERATE (*)    Ketones, ur 5 (*)    All other components within normal limits  ACETAMINOPHEN LEVEL - Abnormal; Notable for the following components:   Acetaminophen (Tylenol), Serum <10 (*)    All other components within normal limits  SALICYLATE LEVEL  ETHANOL  POC URINE PREG, ED   ____________________________________________  EKG  ED ECG REPORT I, Doran Stabler, the attending physician, personally viewed and interpreted this ECG.    Date: 12/31/2017  EKG Time: 1919  Rate: 85  Rhythm: normal sinus rhythm  Axis: Normal  Intervals:none  ST&T Change: No ST segment elevation or depression.  No abnormal T wave inversion.  ____________________________________________  RADIOLOGY  No acute finding on the chest x-ray.  Diffuse stools for the colon but no bowel junction or free air evident on the abdominal x-ray. ____________________________________________   PROCEDURES  Procedure(s) performed:   Procedures  Critical Care performed:   ____________________________________________   INITIAL IMPRESSION / ASSESSMENT  AND PLAN / ED COURSE  Pertinent labs & imaging results that were available during my care of the patient were reviewed by me and considered in my medical decision making (see chart for details).  DDX: Intentional ingestion, esophageal irritation, esophageal burn, nausea, abdominal pain As part of my medical decision making, I reviewed the following data within the [place] Notes from prior ED visits  ----------------------------------------- 7:15 PM on 12/31/2017 -----------------------------------------  Discussed the case with Ebony Hail from poison control who recommends normal saline and Zofran and observation.  If patient is able to she may have a p.o. challenge and should be observed for approximately 6 hours.  Concern is injury requiring possible scoping by GI.  Patient placed under involuntary commitment.  Patient aware of need for consultation by psychiatry and commitment.  ----------------------------------------- 7:52 PM on 12/31/2017 -----------------------------------------  Patient has calmed significantly.  No longer complaining of any abdominal pain.  We will continue to observe.  ----------------------------------------- 10:54 PM on 12/31/2017 -----------------------------------------  Patient able to tolerate p.o. fluids.  No further distress.  Patient having her  specialist on-call consultation at this time. ____________________________________________   FINAL CLINICAL IMPRESSION(S) / ED DIAGNOSES  Acetone ingestion.  Auditory hallucinations.  Suicide attempt.  NEW MEDICATIONS STARTED DURING THIS VISIT:  New Prescriptions   No medications on file     Note:  This document was prepared using Dragon voice recognition software and may include unintentional dictation errors.     Orbie Pyo, MD 12/31/17 [phone]
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
## 2017-12-31 NOTE — ED Notes (Signed)
PT's POCT results: NEGATIVE

## 2017-12-31 NOTE — ED Notes (Signed)
Telephone call was placed to [facility]; spoke with Ciara; who stated that, according to their records on this Patient; there is no one listed as her Guardian; and that, there are no phone numbers for her parents. Patient says that, her mother is here Guardian.

## 2017-12-31 NOTE — ED Triage Notes (Signed)
Pt is from [facility] in [place] Was angry today and drank 1/2 bottle of acetone nail polish removal and states it was to kill herself.

## 2018-01-01 NOTE — ED Notes (Signed)
Patient with discharge instructions back to group home, Patient and caregiver voiced understanding of discharge instructions, no behavior issues with patient, Patient's belongings returned and she was transported via [place] back to group home.

## 2018-01-01 NOTE — ED Notes (Signed)
Pt asleep, breakfast tray placed in rm.  

## 2018-01-01 NOTE — ED Notes (Signed)
Hourly rounding reveals patient sleeping in room. No complaints, stable, in no acute distress. Q15 minute rounds and monitoring via Security Cameras to continue. 

## 2018-01-01 NOTE — ED Provider Notes (Signed)
-----------------------------------------   12:46 AM on 01/01/2018 -----------------------------------------   Blood pressure 117/72, pulse 66, temperature 98.2 F (36.8 C), temperature source Oral, resp. rate 16, height 5\' 6"  (1.676 m), weight 101 kg, last menstrual period 12/24/2017, SpO2 100 %.  Assuming care from Dr. Clearnce Hasten.  In short, Melanie Bell is a 27 y.o. female with a chief complaint of Drug Overdose and Suicidal .  Refer to the original H&P for additional details.  The current plan of care is to follow the recommendations of the specialist on-call.  The patient was seen by the specialist on-call.  They felt that the patient did not meet criteria for involuntary commitment.  They feel that the patient is psychiatrically cleared for discharge back to her group home.  The patient's involuntary commitment paperwork was rescinded.        Loney Hering, MD 01/01/18 [id number]

## 2018-01-01 NOTE — ED Notes (Signed)
Spoke with G.L., RN to provide patient information; in reference to transfer patient to [facility].

## 2018-01-01 NOTE — ED Notes (Signed)
Nurse called group home to let them know that Patient is ready for discharge, lady states that driver is on the way to pick her up, all belongings will be returned.

## 2018-01-01 NOTE — ED Notes (Signed)
Pt. Transferred to [place] from ED to room 1 after screening for contraband. Report to include Situation, Background, Assessment and Recommendations from [facility]. Pt. Oriented to unit including Q15 minute rounds as well as the security cameras for their protection. Patient is alert and oriented, warm and dry in no acute distress. Patient denies HI, and VH. Patient states she still has SI without a plan and she hears voices telling her she needs to hurt herself. Pt. Encouraged to let me know if needs arise.

## 2018-01-02 ENCOUNTER — Other Ambulatory Visit: Payer: Self-pay

## 2018-01-02 ENCOUNTER — Emergency Department
Admission: EM | Admit: 2018-01-02 | Discharge: 2018-01-02 | Disposition: A | Discharge status: Other Acute Inpatient Hospital | Payer: Medicare Other | Attending: Emergency Medicine | Admitting: Emergency Medicine

## 2018-01-02 ENCOUNTER — Inpatient Hospital Stay
Admission: AD | Admit: 2018-01-02 | Discharge: 2018-01-12 | DRG: 885 | Disposition: A | Discharge status: Home / Self Care | Payer: Medicare Other | Attending: Psychiatry | Admitting: Psychiatry

## 2018-01-02 ENCOUNTER — Encounter: Payer: Self-pay | Admitting: *Deleted

## 2018-01-02 DIAGNOSIS — K219 Gastro-esophageal reflux disease without esophagitis: Secondary | ICD-10-CM | POA: Diagnosis present

## 2018-01-02 DIAGNOSIS — Z915 Personal history of self-harm: Secondary | ICD-10-CM

## 2018-01-02 DIAGNOSIS — Z818 Family history of other mental and behavioral disorders: Secondary | ICD-10-CM

## 2018-01-02 DIAGNOSIS — Z825 Family history of asthma and other chronic lower respiratory diseases: Secondary | ICD-10-CM

## 2018-01-02 DIAGNOSIS — I10 Essential (primary) hypertension: Secondary | ICD-10-CM | POA: Diagnosis present

## 2018-01-02 DIAGNOSIS — Z91041 Radiographic dye allergy status: Secondary | ICD-10-CM | POA: Diagnosis not present

## 2018-01-02 DIAGNOSIS — F419 Anxiety disorder, unspecified: Secondary | ICD-10-CM | POA: Diagnosis present

## 2018-01-02 DIAGNOSIS — F4310 Post-traumatic stress disorder, unspecified: Secondary | ICD-10-CM | POA: Diagnosis present

## 2018-01-02 DIAGNOSIS — R45851 Suicidal ideations: Secondary | ICD-10-CM

## 2018-01-02 DIAGNOSIS — E785 Hyperlipidemia, unspecified: Secondary | ICD-10-CM | POA: Diagnosis present

## 2018-01-02 DIAGNOSIS — F250 Schizoaffective disorder, bipolar type: Secondary | ICD-10-CM | POA: Diagnosis present

## 2018-01-02 DIAGNOSIS — Z91410 Personal history of adult physical and sexual abuse: Secondary | ICD-10-CM | POA: Diagnosis not present

## 2018-01-02 DIAGNOSIS — F603 Borderline personality disorder: Secondary | ICD-10-CM | POA: Diagnosis present

## 2018-01-02 DIAGNOSIS — Z79899 Other long term (current) drug therapy: Secondary | ICD-10-CM

## 2018-01-02 DIAGNOSIS — Z046 Encounter for general psychiatric examination, requested by authority: Secondary | ICD-10-CM | POA: Insufficient documentation

## 2018-01-02 DIAGNOSIS — Z91013 Allergy to seafood: Secondary | ICD-10-CM

## 2018-01-02 DIAGNOSIS — K5900 Constipation, unspecified: Secondary | ICD-10-CM | POA: Diagnosis present

## 2018-01-02 DIAGNOSIS — Z888 Allergy status to other drugs, medicaments and biological substances status: Secondary | ICD-10-CM | POA: Diagnosis not present

## 2018-01-02 DIAGNOSIS — E039 Hypothyroidism, unspecified: Secondary | ICD-10-CM | POA: Diagnosis present

## 2018-01-02 DIAGNOSIS — Z87891 Personal history of nicotine dependence: Secondary | ICD-10-CM | POA: Insufficient documentation

## 2018-01-02 DIAGNOSIS — Z79890 Hormone replacement therapy (postmenopausal): Secondary | ICD-10-CM | POA: Diagnosis not present

## 2018-01-02 DIAGNOSIS — N938 Other specified abnormal uterine and vaginal bleeding: Secondary | ICD-10-CM

## 2018-01-02 DIAGNOSIS — J45909 Unspecified asthma, uncomplicated: Secondary | ICD-10-CM | POA: Insufficient documentation

## 2018-01-02 HISTORY — DX: Suicidal ideations: R45.851

## 2018-01-02 LAB — COMPREHENSIVE METABOLIC PANEL
ALK PHOS: 71 U/L (ref 38–126)
ALT: 10 U/L (ref 0–44)
ANION GAP: 7 (ref 5–15)
AST: 15 U/L (ref 15–41)
Albumin: 4.1 g/dL (ref 3.5–5.0)
BUN: 9 mg/dL (ref 6–20)
CALCIUM: 9.4 mg/dL (ref 8.9–10.3)
CO2: 23 mmol/L (ref 22–32)
Chloride: 109 mmol/L (ref 98–111)
Creatinine, Ser: 0.71 mg/dL (ref 0.44–1.00)
GFR calc Af Amer: >60 mL/min (ref >60)
GFR calc non Af Amer: >60 mL/min (ref >60)
GLUCOSE: 81 mg/dL (ref 70–99)
Potassium: 3.9 mmol/L (ref 3.5–5.1)
Sodium: 139 mmol/L (ref 135–145)
Total Bilirubin: 0.6 mg/dL (ref 0.3–1.2)
Total Protein: 6.9 g/dL (ref 6.5–8.1)

## 2018-01-02 LAB — CBC
HCT: 38.4 % (ref 35.0–47.0)
Hemoglobin: 13 g/dL (ref 12.0–16.0)
MCH: 30.5 pg (ref 26.0–34.0)
MCHC: 34 g/dL (ref 32.0–36.0)
MCV: 89.8 fL (ref 80.0–100.0)
PLATELETS: 228 10*3/uL (ref 150–440)
RBC: 4.27 MIL/uL (ref 3.80–5.20)
RDW: 13.9 % (ref 11.5–14.5)
WBC: 7.7 10*3/uL (ref 3.6–11.0)

## 2018-01-02 LAB — URINE DRUG SCREEN, QUALITATIVE (ARMC ONLY)
Amphetamines, Ur Screen: NOT DETECTED
BARBITURATES, UR SCREEN: NOT DETECTED
CANNABINOID 50 NG, UR ~~LOC~~: NOT DETECTED
Cocaine Metabolite,Ur ~~LOC~~: NOT DETECTED
MDMA (Ecstasy)Ur Screen: NOT DETECTED
Methadone Scn, Ur: NOT DETECTED
Opiate, Ur Screen: NOT DETECTED
Phencyclidine (PCP) Ur S: NOT DETECTED
TRICYCLIC, UR SCREEN: NOT DETECTED

## 2018-01-02 LAB — ACETAMINOPHEN LEVEL

## 2018-01-02 LAB — ETHANOL: Alcohol, Ethyl (B): <10 mg/dL (ref <10)

## 2018-01-02 LAB — POCT PREGNANCY, URINE: Preg Test, Ur: NEGATIVE

## 2018-01-02 LAB — SALICYLATE LEVEL: Salicylate Lvl: <7 mg/dL (ref 2.8–30.0)

## 2018-01-02 MED ORDER — DOCUSATE SODIUM 100 MG PO CAPS: 100.0000 mg | ORAL_CAPSULE | Freq: Two times a day (BID) | ORAL | Status: DC

## 2018-01-02 MED ORDER — DOCUSATE SODIUM 100 MG PO CAPS
100.0000 mg | ORAL_CAPSULE | Freq: Two times a day (BID) | ORAL | Status: DC
  Administered 2018-01-02 – 2018-01-12 (×20): 100 mg via ORAL
  Filled 2018-01-02 (×20): qty 1

## 2018-01-02 MED ORDER — TRAZODONE HCL 50 MG PO TABS: 50.0000 mg | ORAL_TABLET | Freq: Every day | ORAL | Status: DC

## 2018-01-02 MED ORDER — LEVOTHYROXINE SODIUM 100 MCG PO TABS
100.0000 ug | ORAL_TABLET | Freq: Every day | ORAL | Status: DC
  Administered 2018-01-03 – 2018-01-12 (×10): 100 ug via ORAL
  Filled 2018-01-02 (×10): qty 1

## 2018-01-02 MED ORDER — VENLAFAXINE HCL ER 75 MG PO CP24
150.0000 mg | ORAL_CAPSULE | Freq: Every day | ORAL | Status: DC
  Administered 2018-01-03 – 2018-01-12 (×10): 150 mg via ORAL
  Filled 2018-01-02 (×10): qty 2

## 2018-01-02 MED ORDER — LITHIUM CARBONATE 300 MG PO CAPS: 300.0000 mg | ORAL_CAPSULE | Freq: Two times a day (BID) | ORAL | Status: DC

## 2018-01-02 MED ORDER — OXCARBAZEPINE 300 MG PO TABS
300.0000 mg | ORAL_TABLET | Freq: Two times a day (BID) | ORAL | Status: DC
  Administered 2018-01-02 – 2018-01-12 (×20): 300 mg via ORAL
  Filled 2018-01-02 (×20): qty 1

## 2018-01-02 MED ORDER — LORATADINE 10 MG PO TABS: 10.0000 mg | ORAL_TABLET | Freq: Every day | ORAL | Status: DC

## 2018-01-02 MED ORDER — HYDROXYZINE HCL 50 MG PO TABS
50.0000 mg | ORAL_TABLET | Freq: Three times a day (TID) | ORAL | Status: DC | PRN
  Administered 2018-01-04 – 2018-01-11 (×3): 50 mg via ORAL
  Filled 2018-01-02 (×3): qty 1

## 2018-01-02 MED ORDER — AMLODIPINE BESYLATE 5 MG PO TABS
2.5000 mg | ORAL_TABLET | Freq: Every day | ORAL | Status: DC
  Administered 2018-01-03 – 2018-01-04 (×2): 2.5 mg via ORAL
  Filled 2018-01-02 (×2): qty 1

## 2018-01-02 MED ORDER — PANTOPRAZOLE SODIUM 40 MG PO TBEC
40.0000 mg | DELAYED_RELEASE_TABLET | Freq: Every day | ORAL | Status: DC
  Administered 2018-01-03 – 2018-01-12 (×10): 40 mg via ORAL
  Filled 2018-01-02 (×10): qty 1

## 2018-01-02 MED ORDER — PANTOPRAZOLE SODIUM 40 MG PO TBEC: 40.0000 mg | DELAYED_RELEASE_TABLET | Freq: Every day | ORAL | Status: DC

## 2018-01-02 MED ORDER — ALUM & MAG HYDROXIDE-SIMETH 200-200-20 MG/5ML PO SUSP: 30.0000 mL | ORAL | Status: DC | PRN

## 2018-01-02 MED ORDER — VENLAFAXINE HCL ER 150 MG PO CP24: 150.0000 mg | ORAL_CAPSULE | Freq: Every day | ORAL | Status: DC

## 2018-01-02 MED ORDER — PRAZOSIN HCL 1 MG PO CAPS: 1.0000 mg | ORAL_CAPSULE | Freq: Every day | ORAL | Status: DC

## 2018-01-02 MED ORDER — OXCARBAZEPINE 300 MG PO TABS: 300.0000 mg | ORAL_TABLET | Freq: Two times a day (BID) | ORAL | Status: DC

## 2018-01-02 MED ORDER — LEVOTHYROXINE SODIUM 50 MCG PO TABS: 100.0000 ug | ORAL_TABLET | Freq: Every day | ORAL | Status: DC

## 2018-01-02 MED ORDER — TRAZODONE HCL 50 MG PO TABS
50.0000 mg | ORAL_TABLET | Freq: Every day | ORAL | Status: DC
  Administered 2018-01-02 – 2018-01-11 (×10): 50 mg via ORAL
  Filled 2018-01-02 (×11): qty 1

## 2018-01-02 MED ORDER — MAGNESIUM HYDROXIDE 400 MG/5ML PO SUSP
30.0000 mL | Freq: Every day | ORAL | Status: DC | PRN
  Administered 2018-01-08: 30 mL via ORAL
  Filled 2018-01-02: qty 30

## 2018-01-02 MED ORDER — PRAZOSIN HCL 1 MG PO CAPS
1.0000 mg | ORAL_CAPSULE | Freq: Every day | ORAL | Status: DC
  Administered 2018-01-02: 1 mg via ORAL
  Filled 2018-01-02 (×2): qty 1

## 2018-01-02 MED ORDER — AMLODIPINE BESYLATE 5 MG PO TABS: 2.5000 mg | ORAL_TABLET | Freq: Every day | ORAL | Status: DC

## 2018-01-02 MED ORDER — ACETAMINOPHEN 325 MG PO TABS
650.0000 mg | ORAL_TABLET | Freq: Four times a day (QID) | ORAL | Status: DC | PRN
  Administered 2018-01-03 – 2018-01-07 (×6): 650 mg via ORAL
  Filled 2018-01-02 (×6): qty 2

## 2018-01-02 MED ORDER — LITHIUM CARBONATE 300 MG PO CAPS
300.0000 mg | ORAL_CAPSULE | Freq: Two times a day (BID) | ORAL | Status: DC
  Administered 2018-01-03 – 2018-01-12 (×19): 300 mg via ORAL
  Filled 2018-01-02 (×19): qty 1

## 2018-01-02 MED ORDER — CLOZAPINE 25 MG PO TABS: 150.0000 mg | ORAL_TABLET | Freq: Every day | ORAL | Status: DC

## 2018-01-02 MED ORDER — CLOZAPINE 25 MG PO TABS
150.0000 mg | ORAL_TABLET | Freq: Every day | ORAL | Status: DC
  Administered 2018-01-02 – 2018-01-07 (×6): 150 mg via ORAL
  Filled 2018-01-02 (×7): qty 1

## 2018-01-02 MED ORDER — LORATADINE 10 MG PO TABS
10.0000 mg | ORAL_TABLET | Freq: Every day | ORAL | Status: DC
  Administered 2018-01-03 – 2018-01-12 (×10): 10 mg via ORAL
  Filled 2018-01-02 (×10): qty 1

## 2018-01-02 NOTE — ED Provider Notes (Signed)
[facility] Emergency Department Provider Note  ____________________________________________   I have reviewed the triage vital signs and the nursing notes. Where available I have reviewed prior notes and, if possible and indicated, outside hospital notes.    HISTORY  Chief Complaint Suicidal    HPI Klare Criss is a 27 y.o. female  Who presents today with thoughts of hurting self she does have history of schizoaffective disorder states she is hearing voices, states that she was going to hurt her self but was prevented.  Had some scissors and was brandishing them apparently she states.  Did not take an overdose no other complaints.  Has been feeling bad for couple days.  Recently seen and discharged from this facility.  No other complaints.     Past Medical History:  Diagnosis Date  . Anxiety   . Asthma   . Depression   . GERD (gastroesophageal reflux disease)   . Hallucinations 09/30/2014   Sizoaffective  . Hyperlipidemia   . Hypertension   . Intentional ingestion of batteries 04/21/2016    2 AAA batteries and 1 thumb tack; intent to hurt herself/notes 04/21/2016  . Tardive dyskinesia 10/2014   recent onset    Patient Active Problem List   Diagnosis Date Noted  . Self-inflicted injury 66/44/0347  . ASCUS of cervix with negative high risk HPV 08/28/2017  . Malingering 05/06/2016  . Foreign body aspiration   . Other foreign object in esophagus causing other injury, subsequent encounter   . Swallowed foreign body 04/23/2016  . Foreign body ingestion 04/21/2016  . Gastric foreign body   . Breast tumor 03/05/2016  . Schizoaffective disorder, bipolar type ([place]) 11/06/2014  . Tobacco use disorder 09/30/2014  . Hypothyroidism 09/29/2014  . Hypertension 09/29/2014    Past Surgical History:  Procedure Laterality Date  . ABDOMINAL SURGERY     "years ago" to remove foreign objects  . APPENDECTOMY    . BREAST EXCISIONAL BIOPSY Right  09/03/2007   surgical bx removed fibroadeoma  . BREAST LUMPECTOMY Right   . COLONOSCOPY WITH PROPOFOL N/A 09/10/2015   Procedure: COLONOSCOPY WITH PROPOFOL;  Surgeon: [name], MD;  Location: [facility] ENDOSCOPY;  Service: Endoscopy;  Laterality: N/A;  . ESOPHAGOGASTRODUODENOSCOPY N/A 11/28/2014   Procedure: ESOPHAGOGASTRODUODENOSCOPY (EGD);  Surgeon: [name], MD;  Location: [facility] ENDOSCOPY;  Service: Endoscopy;  Laterality: N/A;  . ESOPHAGOGASTRODUODENOSCOPY N/A 02/21/2016   Procedure: ESOPHAGOGASTRODUODENOSCOPY (EGD);  Surgeon: Mauri Pole, MD;  Location: [facility] ENDOSCOPY;  Service: Endoscopy;  Laterality: N/A;  . ESOPHAGOGASTRODUODENOSCOPY N/A 04/21/2016   Procedure: ESOPHAGOGASTRODUODENOSCOPY (EGD);  Surgeon: Gatha Mayer, MD;  Location: [facility] ENDOSCOPY;  Service: Endoscopy;  Laterality: N/A;  . ESOPHAGOGASTRODUODENOSCOPY N/A 05/06/2016   Procedure: ESOPHAGOGASTRODUODENOSCOPY (EGD);  Surgeon: Jonathon Bellows, MD;  Location: [facility] ENDOSCOPY;  Service: Gastroenterology;  Laterality: N/A;  . ESOPHAGOGASTRODUODENOSCOPY N/A 06/13/2016   Procedure: ESOPHAGOGASTRODUODENOSCOPY (EGD);  Surgeon: [name], MD;  Location: [facility] ENDOSCOPY;  Service: Endoscopy;  Laterality: N/A;  . ESOPHAGOGASTRODUODENOSCOPY (EGD) WITH PROPOFOL N/A 02/29/2016   Procedure: ESOPHAGOGASTRODUODENOSCOPY (EGD) WITH PROPOFOL;  Surgeon: [name], MD;  Location: [facility] ENDOSCOPY;  Service: Endoscopy;  Laterality: N/A;  . LAPAROTOMY N/A 09/12/2015   Procedure: EXPLORATORY LAPAROTOMY;  Surgeon: Florene Glen, MD;  Location: [facility] ORS;  Service: General;  Laterality: N/A;  . SIGMOIDOSCOPY N/A 09/12/2015   Procedure: Lonell Face;  Surgeon: Florene Glen, MD;  Location: [facility] ORS;  Service: General;  Laterality: N/A;  . WISDOM TOOTH EXTRACTION      Prior  to Admission medications   Medication Sig Start Date End Date Taking? Authorizing Provider  amLODipine (NORVASC) 2.5 MG tablet Take 1 tablet (2.5 mg total) by mouth  daily. 12/11/17   [name], Herma Ard B, MD  cetirizine (ZYRTEC) 10 MG tablet Take 1 tablet (10 mg total) by mouth daily as needed for allergies. 12/11/17   [name], Herma Ard B, MD  clozapine (CLOZARIL) 50 MG tablet Take 3 tablets (150 mg total) by mouth at bedtime. Patient taking differently: Take 100 mg by mouth at bedtime.  12/11/17   [name], Herma Ard B, MD  docusate sodium (COLACE) 100 MG capsule Take 1 capsule (100 mg total) by mouth daily as needed. 12/16/17 12/16/18  Orbie Pyo, MD  hydrOXYzine (ATARAX/VISTARIL) 50 MG tablet Take 1 tablet (50 mg total) by mouth 2 (two) times daily as needed. 12/11/17   [name], Wardell Honour, MD  levothyroxine (SYNTHROID, LEVOTHROID) 100 MCG tablet Take 1 tablet (100 mcg total) by mouth daily before breakfast. 12/11/17   [name], [name], MD  lithium carbonate 300 MG capsule Take 1 capsule (300 mg total) by mouth 2 (two) times daily with a meal. 12/11/17   [name], [name], MD  medroxyPROGESTERone (DEPO-PROVERA) 150 MG/ML injection Inject 1 mL (150 mg total) into the muscle every 3 (three) months. 12/11/17   [name], [name], MD  omeprazole (PRILOSEC) 20 MG capsule Take 1 capsule (20 mg total) by mouth daily. 12/11/17   [name], [name], MD  OXcarbazepine (TRILEPTAL) 300 MG tablet Take 1 tablet (300 mg total) by mouth 2 (two) times daily. 12/11/17   [name], [name], MD  polyethylene glycol (MIRALAX / GLYCOLAX) packet Take 17 g by mouth daily as needed. 12/11/17   [name], [name], MD  prazosin (MINIPRESS) 1 MG capsule Take 1 mg by mouth at bedtime.    [provider]  senna (SENOKOT) 8.6 MG TABS tablet Take 1 tablet (8.6 mg total) by mouth daily as needed for mild constipation. 12/11/17   [name], Herma Ard B, MD  traZODone (DESYREL) 50 MG tablet Take 1 tablet (50 mg total) by mouth at bedtime. 12/11/17   [name], Herma Ard B, MD  venlafaxine XR (EFFEXOR-XR) 150 MG 24 hr capsule Take 1 capsule (150 mg total) by mouth  daily with breakfast. 12/11/17   [name], [name], MD  Vitamin D, Ergocalciferol, (DRISDOL) 50000 units CAPS capsule Take 1 capsule (50,000 Units total) by mouth every 7 (seven) days. 12/11/17   [name], Wardell Honour, MD    Allergies Betadine [povidone iodine]; Iodine; and Shellfish-derived products  Family History  Problem Relation Age of Onset  . Depression Mother   . Hypertension Mother   . Sleep apnea Mother   . Asthma Mother   . COPD Mother   . Diabetes Mother   . Breast cancer Maternal Aunt        20's    Social History Social History   Tobacco Use  . Smoking status: Former Smoker    Packs/day: 0.50    Years: 3.00    Pack years: 1.50    Types: Cigarettes    Last attempt to quit: 08/2016    Years since quitting: 1.3  . Smokeless tobacco: Never Used  Substance Use Topics  . Alcohol use: No  . Drug use: No    Review of Systems Constitutional: No fever/chills Eyes: No visual changes. ENT: No sore throat. No stiff neck no neck pain Cardiovascular: Denies chest pain. Respiratory: Denies shortness of breath. Gastrointestinal:   no vomiting.  No  diarrhea.  No constipation. Genitourinary: Negative for dysuria. Musculoskeletal: Negative lower extremity swelling Skin: Negative for rash. Neurological: Negative for severe headaches, focal weakness or numbness.   ____________________________________________   PHYSICAL EXAM:  VITAL SIGNS: ED Triage Vitals  Enc Vitals Group     BP 01/02/18 1640 112/70     Pulse Rate 01/02/18 1640 70     Resp 01/02/18 1640 18     Temp 01/02/18 1640 99.2 F (37.3 C)     Temp Source 01/02/18 1640 Oral     SpO2 01/02/18 1640 97 %     Weight 01/02/18 1642 222 lb (100.7 kg)     Height 01/02/18 1642 5\' 6"  (1.676 m)     Head Circumference --      Peak Flow --      Pain Score 01/02/18 1649 0     Pain Loc --      Pain Edu? --      Excl. in [place]? --     Constitutional: Alert and oriented. Well appearing and in no acute  distress. Eyes: Conjunctivae are normal Head: Atraumatic HEENT: No congestion/rhinnorhea. Mucous membranes are moist.  Oropharynx non-erythematous Neck:   Nontender with no meningismus, no masses, no stridor Cardiovascular: Normal rate, regular rhythm. Grossly normal heart sounds.  Good peripheral circulation. Respiratory: Normal respiratory effort.  No retractions. Lungs CTAB. Abdominal: Soft and nontender. No distention. No guarding no rebound Back:  There is no focal tenderness or step off.  there is no midline tenderness there are no lesions noted. there is no CVA tenderness Musculoskeletal: No lower extremity tenderness, no upper extremity tenderness. No joint effusions, no DVT signs strong distal pulses no edema Neurologic:  Normal speech and language. No gross focal neurologic deficits are appreciated.  Skin:  Skin is warm, dry and intact. No rash noted. Psychiatric: Mood and affect are normal. Speech and behavior are normal.  ____________________________________________   LABS (all labs ordered are listed, but only abnormal results are displayed)  Labs Reviewed  COMPREHENSIVE METABOLIC PANEL  ETHANOL  CBC  SALICYLATE LEVEL  ACETAMINOPHEN LEVEL  URINE DRUG SCREEN, QUALITATIVE ([place])  POC URINE PREG, ED  POCT PREGNANCY, URINE    Pertinent labs  results that were available during my care of the patient were reviewed by me and considered in my medical decision making (see chart for details). ____________________________________________  EKG  I personally interpreted any EKGs ordered by me or triage  ____________________________________________  RADIOLOGY  Pertinent labs & imaging results that were available during my care of the patient were reviewed by me and considered in my medical decision making (see chart for details). If possible, patient and/or family made aware of any abnormal findings.  No results found. ____________________________________________     PROCEDURES  Procedure(s) performed: None  Procedures  Critical Care performed: None  ____________________________________________   INITIAL IMPRESSION / ASSESSMENT AND PLAN / ED COURSE  Pertinent labs & imaging results that were available during my care of the patient were reviewed by me and considered in my medical decision making (see chart for details).  Patient here with suicidal thoughts we will have her evaluated by psychiatry medically clear.  No other evidence of toxidrome or other complaint.  Dense of slit wrists or acute injury    ____________________________________________   FINAL CLINICAL IMPRESSION(S) / ED DIAGNOSES  Final diagnoses:  None      This chart was dictated using voice recognition software.  Despite best efforts to proofread,  errors can occur  which can change meaning.      Schuyler Amor, MD 01/02/18 1739

## 2018-01-02 NOTE — Progress Notes (Signed)
Patient is a new admit to this unit, this patient is known to [name] from her previous encounters in this unit, and did not find it difficult to fit right in , present this time for suicide ideation and depression, stating hearing voices  To kill her self, patient has a Hx. Of schizoaffective disorder, Bipolar type, patient said I'm safe now and denies any SI/HI but endorses depressions 7/10. Body search and skin check done by 2 nurses , no contraband found and skin  is intact. Unit guide lines and expected behaviors discussed, hygiene product provided , medicines given as ordered , unit room orientation complete, patient took a bath and went to bed, 15 minute safety rounding is maintained, no distress.

## 2018-01-02 NOTE — ED Triage Notes (Signed)
Pt to ED under IVC for suicidal attempts and reports of hearing voices that are telling her to hurt herself. PT was discharged yesterday from the hospital for the same complaint. Pt reports the voices continue today and she has not attempted to hurt herself but confirms she is suicidal at this time. No drug use reported and no homicidal thoughts. Pt reports she has been taking medications as prescribed. Increased fatigue reported.

## 2018-01-02 NOTE — Tx Team (Signed)
Initial Treatment Plan 01/02/2018 8:23 PM [name] [facility]:[id number]    PATIENT STRESSORS: Financial difficulties Health problems Medication change or noncompliance   PATIENT STRENGTHS: Average or above average intelligence Capable of independent living Motivation for treatment/growth Supportive family/friends   PATIENT IDENTIFIED PROBLEMS: Suicide ideation with a plan (scissors)    Auditory hallucination    Depression/Anxiety    Swallowing foreign objects         DISCHARGE CRITERIA:  Improved stabilization in mood, thinking, and/or behavior Motivation to continue treatment in a less acute level of care Need for constant or close observation no longer present Verbal commitment to aftercare and medication compliance  PRELIMINARY DISCHARGE PLAN: Outpatient therapy Participate in family therapy Placement in alternative living arrangements Return to previous living arrangement  PATIENT/FAMILY INVOLVEMENT: This treatment plan has been presented to and reviewed with the patient, Melanie Bell, .  The patient  have been given the opportunity to ask questions and make suggestions.  Clemens Catholic, RN 01/02/2018, 8:23 PM

## 2018-01-02 NOTE — ED Notes (Addendum)
Pt dressed out into appropriate behavioral health clothing with this tech, Shannon,RN and Sheriff from [place] in the rm. Pt belongings consist of a black/white shirt, black pants, a tan bra, tan flip flops, one black bobby pin, a yellow hair bow, cream color sweater and white/pink panties. Pt calm and cooperative while dressing out.

## 2018-01-02 NOTE — Progress Notes (Signed)
MEDICATION RELATED CONSULT NOTE - INITIAL   Pharmacy Consult for Clozaril  Indication: clozaril monitoring   Allergies  Allergen Reactions  . Betadine [Povidone Iodine] Other (See Comments)    Reaction:  Unknown   . Iodine Rash  . Shellfish-Derived Products Other (See Comments)    Reaction:  Unknown     Patient Measurements:   Adjusted Body Weight:   Vital Signs: Temp: 98.2 F (36.8 C) (08/20 2031) Temp Source: Oral (08/20 2031) BP: 103/64 (08/20 2031) Pulse Rate: 95 (08/20 2031) Intake/Output from previous day: No intake/output data recorded. Intake/Output from this shift: No intake/output data recorded.  Labs: Recent Labs    12/31/17 1924 01/02/18 1651  WBC 9.4 7.7  HGB 12.3 13.0  HCT 35.9 38.4  PLT 192 228  CREATININE 0.77 0.71  ALBUMIN 3.9 4.1  PROT 6.5 6.9  AST 17 15  ALT 9 10  ALKPHOS 76 71  BILITOT 0.6 0.6   Estimated Creatinine Clearance: 125.4 mL/min (by C-G formula based on SCr of 0.71 mg/dL).   Microbiology: Recent Results (from the past 720 hour(s))  Wet prep, genital     Status: Abnormal   Collection Time: 12/16/17  6:03 PM  Result Value Ref Range Status   Yeast Wet Prep HPF POC NONE SEEN NONE SEEN Final   Trich, Wet Prep NONE SEEN NONE SEEN Final   Clue Cells Wet Prep HPF POC NONE SEEN NONE SEEN Final   WBC, Wet Prep HPF POC FEW (A) NONE SEEN Final   Sperm NONE SEEN  Final    Comment: Performed at [facility], [address] [place], [place] [address]  [place] rt PCR [facility] only)     Status: None   Collection Time: 12/16/17  6:03 PM  Result Value Ref Range Status   Specimen source GC/Chlam ENDOCERVICAL  Final   Chlamydia Tr NOT DETECTED NOT DETECTED Final   N gonorrhoeae NOT DETECTED NOT DETECTED Final    Comment: (NOTE) 100  This methodology has not been evaluated in pregnant women or in 200  patients with a history of hysterectomy. 300 400  This methodology will not be performed on patients less than 14  years of  age. Performed at [facility], [address], [place], [place] [address]     Medical History: Past Medical History:  Diagnosis Date  . Anxiety   . Asthma   . Depression   . GERD (gastroesophageal reflux disease)   . Hallucinations 09/30/2014   Sizoaffective  . Hyperlipidemia   . Hypertension   . Intentional ingestion of batteries 04/21/2016    2 AAA batteries and 1 thumb tack; intent to hurt herself/notes 04/21/2016  . Tardive dyskinesia 10/2014   recent onset    Medications:  Scheduled:  . [START ON 01/03/2018] amLODipine  2.5 mg Oral Daily  . cloZAPine  150 mg Oral QHS  . docusate sodium  100 mg Oral BID  . [START ON 01/03/2018] levothyroxine  100 mcg Oral QAC breakfast  . [START ON 01/03/2018] lithium carbonate  300 mg Oral BID WC  . [START ON 01/03/2018] loratadine  10 mg Oral Daily  . OXcarbazepine  300 mg Oral BID  . [START ON 01/03/2018] pantoprazole  40 mg Oral Daily  . prazosin  1 mg Oral QHS  . traZODone  50 mg Oral QHS  . [START ON 01/03/2018] venlafaxine XR  150 mg Oral Q breakfast    Assessment: 8/08:  ANC = 2900  8/18:  ANC = 6600  Goal of Therapy:    Plan:  Pt is registered with Clozaril [facility] program and eligible for dispensing.   Pt is currently required for weekly ANC monitoring. Will recheck [place] on 8/25.   Aneli Zara D 01/02/2018,9:06 PM

## 2018-01-02 NOTE — ED Notes (Signed)
Accepted to BMU

## 2018-01-02 NOTE — Progress Notes (Addendum)
Pt A & O X4. Presents with flat affect and depressed mood. Endorsed passive SI, verbally contracts for safety. Denies HI, VH and pain,  reports + AH "the voices are telling me to hurt myself, they say I'm worthless". Per pt "I had a pair of scissors in my hands to hurt myself and the group home staff took it from me". Per chart review pt was recently d/c from the ED yesterday. Support offered. Encouraged pt to voice concerns. Fluids and dinner tray given, tolerated well. Safety checks initiated at Q 15 minutes intervals without self harm gestures at this time.

## 2018-01-02 NOTE — BH Assessment (Signed)
Patient is to be admitted to [facility] by Dr. Weber Cooks.  Attending Physician will be Dr. Bary Leriche.   Patient has been assigned to room 310, by  BMU Nurse Alex.    ER staff is aware of the admission:  Tamra/ Vaughan Basta ER Dian Situ   Per Vaughan Basta will inform Dr. Burlene Arnt   [place]- Patient's Nurse   Sloan Leiter- Patient Access.

## 2018-01-02 NOTE — Consult Note (Signed)
[facility] Face-to-Face Psychiatry Consult   Reason for Consult: Consult for this 27 year old woman with long-standing mental health problems who comes to the emergency room with suicidal ideation Referring Physician: Burlene Arnt Patient Identification: Melanie Bell MRN:  [id number] Principal Diagnosis: Schizoaffective disorder, bipolar type [facility]) Diagnosis:   Patient Active Problem List   Diagnosis Date Noted  . Suicidal ideation [R45.851] 01/02/2018  . Self-inflicted injury [E99.37] 11/23/2017  . ASCUS of cervix with negative high risk HPV [R87.610] 08/28/2017  . Malingering [Z76.5] 05/06/2016  . Foreign body aspiration [T17.900A]   . Other foreign object in esophagus causing other injury, subsequent encounter [T18.198D]   . Swallowed foreign body [T18.9XXA] 04/23/2016  . Foreign body ingestion [T18.9XXA] 04/21/2016  . Gastric foreign body [T18.2XXA]   . 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 Schizoaffective disorder, bipolar type ([place]) [F25.0] 11/06/2014  . Tobacco use disorder [F17.200] 09/30/2014  . Hypothyroidism [E03.9] 09/29/2014  . Hypertension [I10] 09/29/2014    Total Time spent with patient: 1 hour  Subjective:   Melanie Bell is a 27 y.o. female patient admitted with "I had a pair of scissors and I was going to stab myself".  HPI: Patient seen chart reviewed.  Act team brought her to the hospital because she was holding a pair of scissors at the group home threatening to stab herself.  She did not actually cut herself.  She tells me that she has had auditory hallucinations that have been ramping up for about a week now.  They were telling her to stab herself today.  Mood feels sad and anxious down as much or more than usual.  Not sleeping or eating very well.  Patient denies that there is been any specific new stressor.  Denies that anything particular happened today that upset her.  Says that she is compliant with all her medicine and denies having abused any  substances.  Social history: Mother is guardian.  Patient lives in a group home in [place].  Medical history: Long-standing hypothyroidism hypertension multiple surgeries as a result of her long-standing problem with swallowing foreign bodies which fortunately she has not done this time.  Substance abuse history: Typically no substance abuse as part of the issue  Past Psychiatric History: Multiple hospitalizations including one just within the last couple weeks.  Schizoaffective disorder behavior problems multiple suicide attempts and self injury attempts currently on clozapine and lithium.  Risk to Self:   Risk to Others:   Prior Inpatient Therapy:   Prior Outpatient Therapy:    Past Medical History:  Past Medical History:  Diagnosis Date  . Anxiety   . Asthma   . Depression   . GERD (gastroesophageal reflux disease)   . Hallucinations 09/30/2014   Sizoaffective  . Hyperlipidemia   . Hypertension   . Intentional ingestion of batteries 04/21/2016    2 AAA batteries and 1 thumb tack; intent to hurt herself/notes 04/21/2016  . Tardive dyskinesia 10/2014   recent onset    Past Surgical History:  Procedure Laterality Date  . ABDOMINAL SURGERY     "years ago" to remove foreign objects  . APPENDECTOMY    . BREAST EXCISIONAL BIOPSY Right 09/03/2007   surgical bx removed fibroadeoma  . BREAST LUMPECTOMY Right   . COLONOSCOPY WITH PROPOFOL N/A 09/10/2015   Procedure: COLONOSCOPY WITH PROPOFOL;  Surgeon: [name], MD;  Location: [facility] ENDOSCOPY;  Service: Endoscopy;  Laterality: N/A;  . ESOPHAGOGASTRODUODENOSCOPY N/A 11/28/2014   Procedure: ESOPHAGOGASTRODUODENOSCOPY (EGD);  Surgeon: [name], MD;  Location: [facility] ENDOSCOPY;  Service: Endoscopy;  Laterality: N/A;  . ESOPHAGOGASTRODUODENOSCOPY N/A 02/21/2016   Procedure: ESOPHAGOGASTRODUODENOSCOPY (EGD);  Surgeon: Mauri Pole, MD;  Location: [facility] ENDOSCOPY;  Service: Endoscopy;  Laterality: N/A;  .  ESOPHAGOGASTRODUODENOSCOPY N/A 04/21/2016   Procedure: ESOPHAGOGASTRODUODENOSCOPY (EGD);  Surgeon: Gatha Mayer, MD;  Location: [facility] ENDOSCOPY;  Service: Endoscopy;  Laterality: N/A;  . ESOPHAGOGASTRODUODENOSCOPY N/A 05/06/2016   Procedure: ESOPHAGOGASTRODUODENOSCOPY (EGD);  Surgeon: Jonathon Bellows, MD;  Location: [facility] ENDOSCOPY;  Service: Gastroenterology;  Laterality: N/A;  . ESOPHAGOGASTRODUODENOSCOPY N/A 06/13/2016   Procedure: ESOPHAGOGASTRODUODENOSCOPY (EGD);  Surgeon: [name], MD;  Location: [facility] ENDOSCOPY;  Service: Endoscopy;  Laterality: N/A;  . ESOPHAGOGASTRODUODENOSCOPY (EGD) WITH PROPOFOL N/A 02/29/2016   Procedure: ESOPHAGOGASTRODUODENOSCOPY (EGD) WITH PROPOFOL;  Surgeon: [name], MD;  Location: [facility] ENDOSCOPY;  Service: Endoscopy;  Laterality: N/A;  . LAPAROTOMY N/A 09/12/2015   Procedure: EXPLORATORY LAPAROTOMY;  Surgeon: Florene Glen, MD;  Location: [facility] ORS;  Service: General;  Laterality: N/A;  . SIGMOIDOSCOPY N/A 09/12/2015   Procedure: Lonell Face;  Surgeon: Florene Glen, MD;  Location: [facility] ORS;  Service: General;  Laterality: N/A;  . WISDOM TOOTH EXTRACTION     Family History:  Family History  Problem Relation Age of Onset  . Depression Mother   . Hypertension Mother   . Sleep apnea Mother   . Asthma Mother   . COPD Mother   . Diabetes Mother   . Breast cancer Maternal Aunt        20's   Family Psychiatric  History: Unknown Social History:  Social History   Substance and Sexual Activity  Alcohol Use No     Social History   Substance and Sexual Activity  Drug Use No    Social History   Socioeconomic History  . Marital status: Single    Spouse name: Not on file  . Number of children: Not on file  . Years of education: Not on file  . Highest education level: Not on file  Occupational History  . Not on file  Social Needs  . Financial resource strain: Not on file  . Food insecurity:    Worry: Not on file    Inability: Not on file  .  Transportation needs:    Medical: Not on file    Non-medical: Not on file  Tobacco Use  . Smoking status: Former Smoker    Packs/day: 0.50    Years: 3.00    Pack years: 1.50    Types: Cigarettes    Last attempt to quit: 08/2016    Years since quitting: 1.3  . Smokeless tobacco: Never Used  Substance and Sexual Activity  . Alcohol use: No  . Drug use: No  . Sexual activity: Never    Birth control/protection: Injection  Lifestyle  . Physical activity:    Days per week: Not on file    Minutes per session: Not on file  . Stress: Not on file  Relationships  . Social connections:    Talks on phone: Not on file    Gets together: Not on file    Attends religious service: Not on file    Active member of club or organization: Not on file    Attends meetings of clubs or organizations: Not on file    Relationship status: Not on file  Other Topics Concern  . Not on file  Social History Narrative  . Not on file   Additional Social History:    Allergies:   Allergies  Allergen Reactions  . Betadine [Povidone Iodine] Other (See Comments)    Reaction:  Unknown   . Iodine Rash  . Shellfish-Derived Products Other (See Comments)    Reaction:  Unknown     Labs:  Results for orders placed or performed during the hospital encounter of 01/02/18 (from the past 48 hour(s))  Comprehensive metabolic panel     Status: None   Collection Time: 01/02/18  4:51 PM  Result Value Ref Range   Sodium 139 135 - 145 mmol/L   Potassium 3.9 3.5 - 5.1 mmol/L   Chloride 109 98 - 111 mmol/L   CO2 23 22 - 32 mmol/L   Glucose, Bld 81 70 - 99 mg/dL   BUN 9 6 - 20 mg/dL   Creatinine, Ser 0.71 0.44 - 1.00 mg/dL   Calcium 9.4 8.9 - 10.3 mg/dL   Total Protein 6.9 6.5 - 8.1 g/dL   Albumin 4.1 3.5 - 5.0 g/dL   AST 15 15 - 41 U/L   ALT 10 0 - 44 U/L   Alkaline Phosphatase 71 38 - 126 U/L   Total Bilirubin 0.6 0.3 - 1.2 mg/dL   GFR calc non Af Amer >60 >60 mL/min   GFR calc Af Amer >60 >60 mL/min     Comment: (NOTE) The eGFR has been calculated using the CKD EPI equation. This calculation has not been validated in all clinical situations. eGFR's persistently <60 mL/min signify possible Chronic Kidney Disease.    Anion gap 7 5 - 15    Comment: Performed at [facility], [address] [place], [place] [address]  Ethanol     Status: None   Collection Time: 01/02/18  4:51 PM  Result Value Ref Range   Alcohol, Ethyl (B) <10 <10 mg/dL    Comment: (NOTE) Lowest detectable limit for serum alcohol is 10 mg/dL. For medical purposes only. Performed at [facility], [address] [place], [place] [address]   Salicylate level     Status: None   Collection Time: 01/02/18  4:51 PM  Result Value Ref Range   Salicylate Lvl <8.8 2.8 - 30.0 mg/dL    Comment: Performed at [facility], [address] [place], [place] [address]  Acetaminophen level     Status: Abnormal   Collection Time: 01/02/18  4:51 PM  Result Value Ref Range   Acetaminophen (Tylenol), Serum <10 (L) 10 - 30 ug/mL    Comment: (NOTE) Therapeutic concentrations vary significantly. A range of 10-30 ug/mL  may be an effective concentration for many patients. However, some  are best treated at concentrations outside of this range. Acetaminophen concentrations >150 ug/mL at 4 hours after ingestion  and >50 ug/mL at 12 hours after ingestion are often associated with  toxic reactions. Performed at [facility], [address] [place], [place] [address]   cbc     Status: None   Collection Time: 01/02/18  4:51 PM  Result Value Ref Range   WBC 7.7 3.6 - 11.0 K/uL   RBC 4.27 3.80 - 5.20 MIL/uL   Hemoglobin 13.0 12.0 - 16.0 g/dL   HCT 38.4 35.0 - 47.0 %   MCV 89.8 80.0 - 100.0 fL   MCH 30.5 26.0 - 34.0 pg   MCHC 34.0 32.0 - 36.0 g/dL   RDW 13.9 11.5 - 14.5 %   Platelets 228 150 - 440 K/uL    Comment: Performed at [facility], [address] [place],  [address]  Urine Drug Screen, Qualitative     Status: Abnormal   Collection Time: 01/02/18  4:51 PM  Result Value Ref Range   Tricyclic, Ur Screen NONE DETECTED NONE DETECTED   Amphetamines, Ur Screen NONE DETECTED NONE DETECTED   MDMA (Ecstasy)Ur Screen NONE DETECTED NONE DETECTED   Cocaine Metabolite,Ur [place] NONE DETECTED NONE DETECTED   Opiate, Ur Screen NONE DETECTED NONE DETECTED   Phencyclidine (PCP) Ur S NONE DETECTED NONE DETECTED   Cannabinoid 50 Ng, Ur [place] NONE DETECTED NONE DETECTED   Barbiturates, Ur Screen NONE DETECTED NONE DETECTED   Benzodiazepine, Ur Scrn TEST NOT PERFORMED, REAGENT NOT AVAILABLE (A) NONE DETECTED   Methadone Scn, Ur NONE DETECTED NONE DETECTED    Comment: (NOTE) Tricyclics + metabolites, urine    Cutoff 1000 ng/mL Amphetamines + metabolites, urine  Cutoff 1000 ng/mL MDMA (Ecstasy), urine              Cutoff 500 ng/mL Cocaine Metabolite, urine          Cutoff 300 ng/mL Opiate + metabolites, urine        Cutoff 300 ng/mL Phencyclidine (PCP), urine         Cutoff 25 ng/mL Cannabinoid, urine                 Cutoff 50 ng/mL Barbiturates + metabolites, urine  Cutoff 200 ng/mL Benzodiazepine, urine              Cutoff 200 ng/mL Methadone, urine                   Cutoff 300 ng/mL The urine drug screen provides only a preliminary, unconfirmed analytical test result and should not be used for non-medical purposes. Clinical consideration and professional judgment should be applied to any positive drug screen result due to possible interfering substances. A more specific alternate chemical method must be used in order to obtain a confirmed analytical result. Gas chromatography / mass spectrometry (GC/MS) is the preferred confirmat ory method. Performed at [facility], [address] [place], [place] [address]   Pregnancy, urine POC     Status: None   Collection Time: 01/02/18  4:59 PM  Result Value Ref Range   Preg Test, Ur NEGATIVE NEGATIVE    Comment:         THE SENSITIVITY OF THIS METHODOLOGY IS >24 mIU/mL     Current Facility-Administered Medications  Medication Dose Route Frequency Provider Last Rate Last Dose  . amLODipine (NORVASC) tablet 2.5 mg  2.5 mg Oral Daily Jessee Newnam T, MD      . cloZAPine (CLOZARIL) tablet 150 mg  150 mg Oral QHS Jarek Longton T, MD      . docusate sodium (COLACE) capsule 100 mg  100 mg Oral BID Tyleigh Mahn, [name], MD      . Derrill Memo ON 01/03/2018] levothyroxine (SYNTHROID, LEVOTHROID) tablet 100 mcg  100 mcg Oral QAC breakfast Enriqueta Augusta, [name], MD      . Derrill Memo ON 01/03/2018] lithium carbonate capsule 300 mg  300 mg Oral BID WC Charlis Harner T, MD      . loratadine (CLARITIN) tablet 10 mg  10 mg Oral Daily Shubham Thackston T, MD      . Oxcarbazepine (TRILEPTAL) tablet 300 mg  300 mg Oral BID Akshitha Culmer T, MD      . pantoprazole (PROTONIX) EC tablet 40 mg  40 mg Oral Daily Tirzah Fross T, MD      . prazosin (MINIPRESS) capsule  1 mg  1 mg Oral QHS Jiaire Rosebrook T, MD      . traZODone (DESYREL) tablet 50 mg  50 mg Oral QHS Tobiah Celestine T, MD      . Derrill Memo ON 01/03/2018] venlafaxine XR (EFFEXOR-XR) 24 hr capsule 150 mg  150 mg Oral Q breakfast Kearie Mennen, [name], MD       Current Outpatient Medications  Medication Sig Dispense Refill  . amLODipine (NORVASC) 2.5 MG tablet Take 1 tablet (2.5 mg total) by mouth daily. 30 tablet 0  . cetirizine (ZYRTEC) 10 MG tablet Take 1 tablet (10 mg total) by mouth daily as needed for allergies. 30 tablet 1  . clozapine (CLOZARIL) 50 MG tablet Take 3 tablets (150 mg total) by mouth at bedtime. (Patient taking differently: Take 100 mg by mouth at bedtime. ) 90 tablet 1  . docusate sodium (COLACE) 100 MG capsule Take 1 capsule (100 mg total) by mouth daily as needed. 30 capsule 0  . hydrOXYzine (ATARAX/VISTARIL) 50 MG tablet Take 1 tablet (50 mg total) by mouth 2 (two) times daily as needed. 90 tablet 1  . levothyroxine (SYNTHROID, LEVOTHROID) 100 MCG tablet Take 1 tablet (100 mcg  total) by mouth daily before breakfast. 30 tablet 1  . lithium carbonate 300 MG capsule Take 1 capsule (300 mg total) by mouth 2 (two) times daily with a meal. 60 capsule 1  . medroxyPROGESTERone (DEPO-PROVERA) 150 MG/ML injection Inject 1 mL (150 mg total) into the muscle every 3 (three) months. 1 mL 1  . omeprazole (PRILOSEC) 20 MG capsule Take 1 capsule (20 mg total) by mouth daily. 30 capsule 1  . OXcarbazepine (TRILEPTAL) 300 MG tablet Take 1 tablet (300 mg total) by mouth 2 (two) times daily. 60 tablet 1  . polyethylene glycol (MIRALAX / GLYCOLAX) packet Take 17 g by mouth daily as needed. 30 each 1  . prazosin (MINIPRESS) 1 MG capsule Take 1 mg by mouth at bedtime.    . senna (SENOKOT) 8.6 MG TABS tablet Take 1 tablet (8.6 mg total) by mouth daily as needed for mild constipation. 120 each 0  . traZODone (DESYREL) 50 MG tablet Take 1 tablet (50 mg total) by mouth at bedtime. 30 tablet 1  . venlafaxine XR (EFFEXOR-XR) 150 MG 24 hr capsule Take 1 capsule (150 mg total) by mouth daily with breakfast. 30 capsule 1  . Vitamin D, Ergocalciferol, (DRISDOL) 50000 units CAPS capsule Take 1 capsule (50,000 Units total) by mouth every 7 (seven) days. 4 capsule 1    Musculoskeletal: Strength & Muscle Tone: within normal limits Gait & Station: normal Patient leans: N/A  Psychiatric Specialty Exam: Physical Exam  Nursing note and vitals reviewed. Constitutional: She appears well-developed and well-nourished.  HENT:  Head: Normocephalic and atraumatic.  Eyes: Pupils are equal, round, and reactive to light. Conjunctivae are normal.  Neck: Normal range of motion.  Cardiovascular: Regular rhythm and normal heart sounds.  Respiratory: Effort normal. No respiratory distress.  GI: Soft.  Musculoskeletal: Normal range of motion.  Neurological: She is alert.  Skin: Skin is warm and dry.  Psychiatric: Her affect is blunt. Her speech is delayed. She is slowed. Cognition and memory are impaired. She  expresses impulsivity. She exhibits a depressed mood. She expresses suicidal ideation. She expresses suicidal plans.    Review of Systems  Constitutional: Negative.   HENT: Negative.   Eyes: Negative.   Respiratory: Negative.   Cardiovascular: Negative.   Gastrointestinal: Negative.   Musculoskeletal: Negative.  Skin: Negative.   Neurological: Negative.   Psychiatric/Behavioral: Positive for depression, hallucinations and suicidal ideas. Negative for memory loss and substance abuse. The patient is nervous/anxious and has insomnia.     Blood pressure (!) 113/55, pulse 87, temperature 99.2 F (37.3 C), temperature source Oral, resp. rate 16, height 5' 6"  (1.676 m), weight 100.7 kg, last menstrual period 01/02/2018, SpO2 97 %.Body mass index is 35.83 kg/m.  General Appearance: Casual  Eye Contact:  Fair  Speech:  Slow  Volume:  Decreased  Mood:  Anxious and Depressed  Affect:  Blunt and Depressed  Thought Process:  Coherent  Orientation:  Full (Time, Place, and Person)  Thought Content:  Logical and Hallucinations: Auditory  Suicidal Thoughts:  Yes.  with intent/plan  Homicidal Thoughts:  No  Memory:  Immediate;   Fair Recent;   Fair Remote;   Fair  Judgement:  Fair  Insight:  Fair  Psychomotor Activity:  Decreased  Concentration:  Concentration: Fair  Recall:  [employer] of Knowledge:  Fair  Language:  Fair  Akathisia:  No  Handed:  Right  AIMS (if indicated):     Assets:  Desire for Improvement Housing Social Support  ADL's:  Intact  Cognition:  Impaired,  Mild  Sleep:        Treatment Plan Summary: Daily contact with patient to assess and evaluate symptoms and progress in treatment, Medication management and Plan Patient with schizoaffective disorder once again having auditory hallucinations suicidal ideation thoughts of stabbing herself.  She has done herself some pretty serious harm in the past and is certainly capable of stabbing herself or doing many other  things to harm herself.  Affect is dysphoric and down patient is withdrawn.  Patient will be admitted to the psychiatric ward because of suicidal ideation.  Continue outpatient medicine.  15-minute checks in place.  Case reviewed with TTS and emergency room doctor.  Disposition: Recommend psychiatric Inpatient admission when medically cleared. Supportive therapy provided about ongoing stressors.  Alethia Berthold, MD 01/02/2018 7:17 PM

## 2018-01-02 NOTE — Progress Notes (Signed)
Patient ID: Melanie Bell, female   DOB: 1990-05-20, 27 y.o.   MRN: [id number] Per State regulations 482.30 this chart was reviewed for medical necessity with respect to the patient's admission/duration of stay.    Next review date: 01/06/18  Debarah Crape, BSN, RN-BC  Case Manager

## 2018-01-02 NOTE — ED Notes (Signed)
Report to include Situation, Background, Assessment, and Recommendations received from RN [place] . Patient alert and oriented, warm and dry, in no acute distress. Patient 7 SI plan to stab herself with scissors. She denies HI, AVH and pain. Patient made aware of Q15 minute rounds and Engineer, drilling presence for their safety. Patient instructed to come to me with needs or concerns.

## 2018-01-03 DIAGNOSIS — F4310 Post-traumatic stress disorder, unspecified: Secondary | ICD-10-CM

## 2018-01-03 DIAGNOSIS — F250 Schizoaffective disorder, bipolar type: Principal | ICD-10-CM

## 2018-01-03 LAB — TSH: TSH: 0.481 u[IU]/mL (ref 0.350–4.500)

## 2018-01-03 MED ORDER — PRAZOSIN HCL 2 MG PO CAPS
2.0000 mg | ORAL_CAPSULE | Freq: Every day | ORAL | Status: DC
  Administered 2018-01-03 – 2018-01-11 (×8): 2 mg via ORAL
  Filled 2018-01-03 (×9): qty 1

## 2018-01-03 NOTE — Tx Team (Addendum)
Interdisciplinary Treatment and Diagnostic Plan Update  01/03/2018 Time of Session: 11am Melanie Bell MRN: [id number]  Principal Diagnosis: <principal problem not specified>  Secondary Diagnoses: Active Problems:   Schizoaffective disorder, bipolar type (HCC)   Current Medications:  Current Facility-Administered Medications  Medication Dose Route Frequency Provider Last Rate Last Dose  . acetaminophen (TYLENOL) tablet 650 mg  650 mg Oral Q6H PRN [name], [name], MD      . alum & mag hydroxide-simeth (MAALOX/MYLANTA) 200-200-20 MG/5ML suspension 30 mL  30 mL Oral Q4H PRN [name], [name], MD      . amLODipine (NORVASC) tablet 2.5 mg  2.5 mg Oral Daily [name], [name], MD   2.5 mg at 01/03/18 0852  . cloZAPine (CLOZARIL) tablet 150 mg  150 mg Oral QHS [name], [name], MD   150 mg at 01/02/18 2124  . docusate sodium (COLACE) capsule 100 mg  100 mg Oral BID [name], [name], MD   100 mg at 01/03/18 0851  . hydrOXYzine (ATARAX/VISTARIL) tablet 50 mg  50 mg Oral TID PRN [name], [name], MD      . levothyroxine (SYNTHROID, LEVOTHROID) tablet 100 mcg  100 mcg Oral QAC breakfast [name], [name], MD   100 mcg at 01/03/18 [phone]  . lithium carbonate capsule 300 mg  300 mg Oral BID WC [name], [name], MD   300 mg at 01/03/18 0851  . loratadine (CLARITIN) tablet 10 mg  10 mg Oral Daily [name], [name], MD   10 mg at 01/03/18 0851  . magnesium hydroxide (MILK OF MAGNESIA) suspension 30 mL  30 mL Oral Daily PRN [name], [name], MD      . Oxcarbazepine (TRILEPTAL) tablet 300 mg  300 mg Oral BID [name], [name], MD   300 mg at 01/03/18 0851  . pantoprazole (PROTONIX) EC tablet 40 mg  40 mg Oral Daily [name], [name], MD   40 mg at 01/03/18 0851  . prazosin (MINIPRESS) capsule 2 mg  2 mg Oral QHS McNew, Holly R, MD      . traZODone (DESYREL) tablet 50 mg  50 mg Oral QHS [name], [name], MD   50 mg at 01/02/18 2125  . venlafaxine XR (EFFEXOR-XR) 24 hr capsule 150 mg  150 mg Oral Q breakfast [name],  [name], MD   150 mg at 01/03/18 8127   PTA Medications: Medications Prior to Admission  Medication Sig Dispense Refill Last Dose  . amLODipine (NORVASC) 2.5 MG tablet Take 1 tablet (2.5 mg total) by mouth daily. 30 tablet 0 12/16/2017 at 0800  . cetirizine (ZYRTEC) 10 MG tablet Take 1 tablet (10 mg total) by mouth daily as needed for allergies. 30 tablet 1 PRN at PRN  . clozapine (CLOZARIL) 50 MG tablet Take 3 tablets (150 mg total) by mouth at bedtime. (Patient taking differently: Take 100 mg by mouth at bedtime. ) 90 tablet 1 12/15/2017 at 2000  . docusate sodium (COLACE) 100 MG capsule Take 1 capsule (100 mg total) by mouth daily as needed. 30 capsule 0   . hydrOXYzine (ATARAX/VISTARIL) 50 MG tablet Take 1 tablet (50 mg total) by mouth 2 (two) times daily as needed. 90 tablet 1 PRN at PRN  . levothyroxine (SYNTHROID, LEVOTHROID) 100 MCG tablet Take 1 tablet (100 mcg total) by mouth daily before breakfast. 30 tablet 1 12/16/2017 at 0700  . lithium carbonate 300 MG capsule Take 1 capsule (300 mg total) by mouth 2 (two) times daily with a meal.  60 capsule 1 12/16/2017 at 0800  . medroxyPROGESTERone (DEPO-PROVERA) 150 MG/ML injection Inject 1 mL (150 mg total) into the muscle every 3 (three) months. 1 mL 1 Past Month at Unknown time  . omeprazole (PRILOSEC) 20 MG capsule Take 1 capsule (20 mg total) by mouth daily. 30 capsule 1 12/16/2017 at 0800  . OXcarbazepine (TRILEPTAL) 300 MG tablet Take 1 tablet (300 mg total) by mouth 2 (two) times daily. 60 tablet 1 12/16/2017 at 0800  . polyethylene glycol (MIRALAX / GLYCOLAX) packet Take 17 g by mouth daily as needed. 30 each 1 PRN at PRN  . prazosin (MINIPRESS) 1 MG capsule Take 1 mg by mouth at bedtime.   12/15/2017 at 2000  . senna (SENOKOT) 8.6 MG TABS tablet Take 1 tablet (8.6 mg total) by mouth daily as needed for mild constipation. 120 each 0 PRN at PRN  . traZODone (DESYREL) 50 MG tablet Take 1 tablet (50 mg total) by mouth at bedtime. 30 tablet 1 12/15/2017 at  2000  . venlafaxine XR (EFFEXOR-XR) 150 MG 24 hr capsule Take 1 capsule (150 mg total) by mouth daily with breakfast. 30 capsule 1 12/16/2017 at 0800  . Vitamin D, Ergocalciferol, (DRISDOL) 50000 units CAPS capsule Take 1 capsule (50,000 Units total) by mouth every 7 (seven) days. 4 capsule 1 12/13/2017 at 0800    Patient Stressors: Financial difficulties Health problems Medication change or noncompliance  Patient Strengths: Average or above average intelligence Capable of independent living Motivation for treatment/growth Supportive family/friends  Treatment Modalities: Medication Management, Group therapy, Case management,  1 to 1 session with clinician, Psychoeducation, Recreational therapy.   Physician Treatment Plan for Primary Diagnosis: <principal problem not specified> Long Term Goal(s):     Short Term Goals:    Medication Management: Evaluate patient's response, side effects, and tolerance of medication regimen.  Therapeutic Interventions: 1 to 1 sessions, Unit Group sessions and Medication administration.  Evaluation of Outcomes: Progressing  Physician Treatment Plan for Secondary Diagnosis: Active Problems:   Schizoaffective disorder, bipolar type ([place])  Long Term Goal(s):     Short Term Goals:       Medication Management: Evaluate patient's response, side effects, and tolerance of medication regimen.  Therapeutic Interventions: 1 to 1 sessions, Unit Group sessions and Medication administration.  Evaluation of Outcomes: Progressing   RN Treatment Plan for Primary Diagnosis: <principal problem not specified> Long Term Goal(s): Knowledge of disease and therapeutic regimen to maintain health will improve  Short Term Goals: Ability to verbalize feelings will improve, Ability to identify and develop effective coping behaviors will improve and Compliance with prescribed medications will improve  Medication Management: RN will administer medications as ordered by  provider, will assess and evaluate patient's response and provide education to patient for prescribed medication. RN will report any adverse and/or side effects to prescribing provider.  Therapeutic Interventions: 1 on 1 counseling sessions, Psychoeducation, Medication administration, Evaluate responses to treatment, Monitor vital signs and CBGs as ordered, Perform/monitor CIWA, COWS, AIMS and Fall Risk screenings as ordered, Perform wound care treatments as ordered.  Evaluation of Outcomes: Progressing   LCSW Treatment Plan for Primary Diagnosis: <principal problem not specified> Long Term Goal(s): Safe transition to appropriate next level of care at discharge, Engage patient in therapeutic group addressing interpersonal concerns.  Short Term Goals: Engage patient in aftercare planning with referrals and resources, Increase ability to appropriately verbalize feelings, Increase emotional regulation, Identify triggers associated with mental health/substance abuse issues and Increase skills for wellness and recovery  Therapeutic Interventions: Assess for all discharge needs, 1 to 1 time with Education officer, museum, Explore available resources and support systems, Assess for adequacy in community support network, Educate family and significant other(s) on suicide prevention, Complete Psychosocial Assessment, Interpersonal group therapy.  Evaluation of Outcomes: Progressing   Progress in Treatment: Attending groups: No. Participating in groups: No. Taking medication as prescribed: Yes. Toleration medication: Yes. Family/Significant other contact made: Yes, individual(s) contacted:  Patients mother Patient understands diagnosis: Yes. Discussing patient identified problems/goals with staff: Yes. Medical problems stabilized or resolved: Yes. Denies suicidal/homicidal ideation: Yes. Issues/concerns per patient self-inventory: No. Other:   New problem(s) identified: No, Describe:  None  New Short  Term/Long Term Goal(s): "To get the voices and depression under control."  Patient Goals:   "To get the voices and depression under control."  Discharge Plan or Barriers: To return back to your group home and follow up with your ACTT Team.  Reason for Continuation of Hospitalization: Depression Hallucinations Medication stabilization  Estimated Length of Stay: 3-5 days  Recreational Therapy: Patient Stressors: N/A Patient Goal: Patient will identify 3 positive coping skills to decrease depressive symptoms within 5 recreation therapy group sessions  Attendees: Patient: Melanie Bell 01/03/2018 11:33 AM  Physician: Amador Cunas, MD 01/03/2018 11:33 AM  Nursing: Polly Cobia, RN 01/03/2018 11:33 AM  RN Care Manager: 01/03/2018 11:33 AM  Social Worker: Darin Engels, [place] 01/03/2018 11:33 AM  Recreational Therapist:  01/03/2018 11:33 AM  Other: Thea Silversmith, LCSW 01/03/2018 11:33 AM  Other:  01/03/2018 11:33 AM  Other: 01/03/2018 11:33 AM    Scribe for Treatment Team: Darin Engels, LCSW 01/03/2018 11:33 AM
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
## 2018-01-03 NOTE — [facility] Group Notes (Signed)
[facility] Group Notes:  (Nursing/MHT/Case Management/Adjunct)  Date:  01/03/2018  Time:  9:17 PM  Type of Therapy:  Group Therapy  Participation Level:  Active  Participation Quality:  Appropriate  Affect:  Appropriate  Cognitive:  Appropriate  Insight:  Appropriate  Engagement in Group:  Engaged  Modes of Intervention:  Discussion  Summary of Progress/Problems: Melanie Bell was in attendance for group. Melanie Bell stated. Melanie Bell stated her goal was to be honest with the doctor. Melanie Bell stated she was, so she accomplished her goal.  [name] 01/03/2018, 9:17 PM
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
## 2018-01-03 NOTE — Plan of Care (Signed)
Patient states "the voices are the same."Voices states "you are not worth to live".Patient contracts for safety.Appropriate with staff & peers.Attended groups.Compliant with medications.Appetite and energy level good.Support and encouragement given.

## 2018-01-03 NOTE — BHH Suicide Risk Assessment (Signed)
[place] INPATIENT:  Family/Significant Other Suicide Prevention Education  Suicide Prevention Education:  Education Completed; Curly Rim, mother, [name] [phone] has been identified by the patient as the family member/significant other with whom the patient will be residing, and identified as the person(s) who will aid the patient in the event of a mental health crisis (suicidal ideations/suicide attempt).  With written consent from the patient, the family member/significant other has been provided the following suicide prevention education, prior to the and/or following the discharge of the patient.  The suicide prevention education provided includes the following:  Suicide risk factors  Suicide prevention and interventions  National Suicide Hotline telephone number  [facility] assessment telephone number  [facility] Emergency Assistance [place] and/or Residential Mobile Crisis Unit telephone number  Request made of family/significant other to:  Remove weapons (e.g., guns, rifles, knives), all items previously/currently identified as safety concern.    Remove drugs/medications (over-the-counter, prescriptions, illicit drugs), all items previously/currently identified as a safety concern.  The family member/significant other verbalizes understanding of the suicide prevention education information provided.  The family member/significant other agrees to remove the items of safety concern listed above.  Mother reports that she is hearing voices typically when she has done something that she should be doing. Mother says that sometimes when she is at a group home she will decide that she doesn't want to be there anymore, and she will start hearing voices or make accusations against group home members. Mother reports that she has no concerns. She says that the patient doesn't have any access to any guns/weapons.   Darin Engels 01/03/2018, 10:00 AM

## 2018-01-03 NOTE — BHH Group Notes (Signed)
LCSW Group Therapy Note  01/03/2018 1:00pm  Type of Therapy/Topic:  Group Therapy:  Emotion Regulation  Participation Level:  Active   Description of Group:    The purpose of this group is to assist patients in learning to regulate negative emotions and experience positive emotions. Patients will be guided to discuss ways in which they have been vulnerable to their negative emotions. These vulnerabilities will be juxtaposed with experiences of positive emotions or situations, and patients will be challenged to use positive emotions to combat negative ones. Special emphasis will be placed on coping with negative emotions in conflict situations, and patients will process healthy conflict resolution skills.  Therapeutic Goals: 1. Patient will identify two positive emotions or experiences to reflect on in order to balance out negative emotions 2. Patient will label two or more emotions that they find the most difficult to experience 3. Patient will demonstrate positive conflict resolution skills through discussion and/or role plays  Summary of Patient Progress: Duyen was able to actively participate in today's group discussion on emotion regulation.  Charisa was able to identify two positive emotions that she experiences to include feeling loved and excited.  She shared that she has two emotions that she finds to be most difficult to experience which includes feeling pressured and depressed.  Katherleen shared with group that she often uses journaling, talking with someone she trusts, and reading Bible verses to help her get back in emotional balance.      Therapeutic Modalities:   Cognitive Behavioral Therapy Feelings Identification Dialectical Behavioral Therapy

## 2018-01-04 DIAGNOSIS — F603 Borderline personality disorder: Secondary | ICD-10-CM

## 2018-01-04 NOTE — Plan of Care (Signed)
Able to express feelings and needs. Compliant with medications

## 2018-01-04 NOTE — Progress Notes (Signed)
Recreation Therapy Notes  Date: 01/04/2018  Time: 9:30 am   Location: Craft Room   Behavioral response: N/A   Intervention Topic: Happiness  Discussion/Intervention: Patient did not attend group.   Clinical Observations/Feedback:  Patient did not attend group.   Kanton Kamel LRT/CTRS        Emilian Stawicki 01/04/2018 11:20 AM

## 2018-01-04 NOTE — BHH Group Notes (Signed)
[place] Group Notes:  (Nursing/MHT/Case Management/Adjunct)  Date:  01/04/2018  Time:  2:57 PM  Type of Therapy:  Psychoeducational Skills  Participation Level:  Active  Participation Quality:  Appropriate, Attentive and Supportive  Affect:  Appropriate  Cognitive:  Alert, Appropriate and Oriented  Insight:  Good  Engagement in Group:  Engaged and Supportive  Modes of Intervention:  Discussion and Education  Summary of Progress/Problems:  Kathi Ludwig 01/04/2018, 2:57 PM
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
## 2018-01-04 NOTE — Progress Notes (Signed)
Recreation Therapy Notes  INPATIENT RECREATION THERAPY ASSESSMENT  Patient Details [name] MRN: [id number] DOB: 1991-02-08 Today's Date: 01/04/2018       Information Obtained From: Patient  Able to Participate in Assessment/Interview: Yes  Patient Presentation: Responsive  Reason for Admission (Per Patient): Active Symptoms, Suicide Attempt, Other (Comments)(Hearing voices)  Patient Stressors:    Coping Skills:   Journal, Music, Prayer  Leisure Interests (2+):  Art - Paint, Art - Coloring, Individual - Reading  Frequency of Recreation/Participation: Monthly  Awareness of Community Resources:     [employer] Resources:  Library  Current Use:    If no, Barriers?:    Expressed Interest in [place] of Residence:  [employer]  Patient Main Form of Transportation: Other (Comment)(ACT Team)  Patient Strengths:  caring  Patient Identified Areas of Improvement:  learn to love myself  Patient Goal for Hospitalization:  get rid of the voices  Current SI (including self-harm):  No  Current HI:  No  Current AVH: No  Staff Intervention Plan: Group Attendance, Collaborate with Interdisciplinary Treatment Team  Consent to Intern Participation: N/A  Keefer Soulliere 01/04/2018, 3:21 PM
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
## 2018-01-04 NOTE — Progress Notes (Addendum)
Patient stayed in the milieu and was cooperative. Attended HS group and maintained a positive attitude. Alert and oriented. Continues to report that she is hearing voices that tell her to hurt herself "But I won't do it". Patient presented to the medication room and receive HS medications voluntarily. Had no concerns. Currently in bed resting. Staff continue to monitor for safety.  0700: Patient slept throughout the night and was comfortable in bed. Had no major concern.

## 2018-01-04 NOTE — Plan of Care (Signed)
Patient verbalized that the voices are the "same" states "you are not worthy to live."Patient contracts for safety in the hospital.Patient appropriate with staff & peers.Compliant with medications.Attended groups.Appetite and energy level good.Support and encouragement given.

## 2018-01-04 NOTE — BHH Group Notes (Signed)
[place] Group Notes:  (Nursing/MHT/Case Management/Adjunct)  Date:  01/04/2018  Time:  10:52 PM  Type of Therapy:  Group Therapy  Participation Level:  Active  Participation Quality:  Appropriate  Affect:  Appropriate  Cognitive:  Appropriate  Insight:  Appropriate  Engagement in Group:  Engaged  Modes of Intervention:  Discussion  Summary of Progress/Problems: Melanie Bell stated she did not have a goal on this day. Melanie Bell stated she slept until lunch time. Melanie Bell stated her medications were adjusted and they don't wear off in the morning.  [name] 01/04/2018, 10:52 PM

## 2018-01-05 ENCOUNTER — Encounter: Payer: Self-pay | Admitting: Psychiatry

## 2018-01-05 NOTE — Plan of Care (Signed)
D: Patient denies SI/HI/AVH. Patient is calm, cooperative and pleasant. Patient endorses depression and anxiety during assessment. Patient is seen in milieu interacting with peers. Patient has complaints of anxiety and pain today related to menstrual cycle.   A: Patient was assessed by this nurse. Patient was oriented to unit. Patient's safety was maintained on unit. Q x 15 minute observation checks were completed for safety. Patient care plan was reviewed. Patient was offered support and encouragement. Patient was encourage to attend groups, participate in unit activities and continue with plan of care.    R: Patient is receptive to treatment and safety maintained on unit.     Problem: Education: Goal: Ability to make informed decisions regarding treatment will improve Outcome: Progressing   Problem: Self-Concept: Goal: Ability to disclose and discuss suicidal ideas will improve Outcome: Progressing   Problem: Safety: Goal: Ability to remain free from injury will improve Outcome: Progressing   Problem: Self-Concept: Goal: Will verbalize positive feelings about self Outcome: Not Progressing   Problem: Self-Concept: Goal: Level of anxiety will decrease Outcome: Not Progressing

## 2018-01-05 NOTE — Progress Notes (Addendum)
[facility] MD Progress Note  01/06/2018 1:22 PM Melanie Bell  MRN:  [id number]  Subjective:   Melanie Bell is feeling better today but still complains of hallucinations ordering her to hurt herself. She is able to contract for safety in the hospital. She admits that she has been under duress lately. She denies side effects from medications. She complains of abdominal cramps and heavy menstrual bleeding going on for 2 months with clots. She feels week and believes that this is the reason why she sleeps so much. She was diagnosed with dysfunctional uterine bleeding but group home did not folloe up with recommended GYN visit. There is a history of abnormal PAP smear.  Principal Problem: Schizoaffective disorder, bipolar type ([place]) Diagnosis:   Patient Active Problem List   Diagnosis Date Noted  . Schizoaffective disorder, bipolar type ([place]) [F25.0] 11/06/2014    Priority: High  . Borderline personality disorder ([place]) [F60.3] 01/04/2018  . PTSD (post-traumatic stress disorder) [F43.10] 01/03/2018  . Suicidal ideation [R45.851] 01/02/2018  . Self-inflicted injury [N39.76] 11/23/2017  . ASCUS of cervix with negative high risk HPV [R87.610] 08/28/2017  . Malingering [Z76.5] 05/06/2016  . Foreign body aspiration [T17.900A]   . Other foreign object in esophagus causing other injury, subsequent encounter [T18.198D]   . Swallowed foreign body [T18.9XXA] 04/23/2016  . Foreign body ingestion [T18.9XXA] 04/21/2016  . Gastric foreign body [T18.2XXA]   . 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 Tobacco use disorder [F17.200] 09/30/2014  . Hypothyroidism [E03.9] 09/29/2014  . Hypertension [I10] 09/29/2014   Total Time spent with patient: 20 minutes  Past Psychiatric History: schizoaffective disorder  Past Medical History:  Past Medical History:  Diagnosis Date  . Anxiety   . Asthma   . Depression   . GERD (gastroesophageal reflux disease)   . Hallucinations 09/30/2014   Sizoaffective  . Hyperlipidemia    . Hypertension   . Intentional ingestion of batteries 04/21/2016    2 AAA batteries and 1 thumb tack; intent to hurt herself/notes 04/21/2016  . Tardive dyskinesia 10/2014   recent onset    Past Surgical History:  Procedure Laterality Date  . ABDOMINAL SURGERY     "years ago" to remove foreign objects  . APPENDECTOMY    . BREAST EXCISIONAL BIOPSY Right 09/03/2007   surgical bx removed fibroadeoma  . BREAST LUMPECTOMY Right   . COLONOSCOPY WITH PROPOFOL N/A 09/10/2015   Procedure: COLONOSCOPY WITH PROPOFOL;  Surgeon: [name], MD;  Location[facility] ENDOSCOPY;  Service: Endoscopy;  Laterality: N/A;  . ESOPHAGOGASTRODUODENOSCOPY N/A 11/28/2014   Procedure: ESOPHAGOGASTRODUODENOSCOPY (EGD);  Surgeon: [name], MD;  Location: [facility] ENDOSCOPY;  Service: Endoscopy;  Laterality: N/A;  . ESOPHAGOGASTRODUODENOSCOPY N/A 02/21/2016   Procedure: ESOPHAGOGASTRODUODENOSCOPY (EGD);  Surgeon: Mauri Pole, MD;  Location: [facility] ENDOSCOPY;  Service: Endoscopy;  Laterality: N/A;  . ESOPHAGOGASTRODUODENOSCOPY N/A 04/21/2016   Procedure: ESOPHAGOGASTRODUODENOSCOPY (EGD);  Surgeon: Gatha Mayer, MD;  Location: [facility] ENDOSCOPY;  Service: Endoscopy;  Laterality: N/A;  . ESOPHAGOGASTRODUODENOSCOPY N/A 05/06/2016   Procedure: ESOPHAGOGASTRODUODENOSCOPY (EGD);  Surgeon: Jonathon Bellows, MD;  Location: [facility] ENDOSCOPY;  Service: Gastroenterology;  Laterality: N/A;  . ESOPHAGOGASTRODUODENOSCOPY N/A 06/13/2016   Procedure: ESOPHAGOGASTRODUODENOSCOPY (EGD);  Surgeon: [name], MD;  Location: [facility] ENDOSCOPY;  Service: Endoscopy;  Laterality: N/A;  . ESOPHAGOGASTRODUODENOSCOPY (EGD) WITH PROPOFOL N/A 02/29/2016   Procedure: ESOPHAGOGASTRODUODENOSCOPY (EGD) WITH PROPOFOL;  Surgeon: [name], MD;  Location: [facility] ENDOSCOPY;  Service: Endoscopy;  Laterality: N/A;  . LAPAROTOMY N/A 09/12/2015   Procedure: EXPLORATORY LAPAROTOMY;  Surgeon: Delfino Lovett  Melchor Amour, MD;  Location: [facility] ORS;  Service: General;  Laterality:  N/A;  . SIGMOIDOSCOPY N/A 09/12/2015   Procedure: Lonell Face;  Surgeon: Florene Glen, MD;  Location: [facility] ORS;  Service: General;  Laterality: N/A;  . WISDOM TOOTH EXTRACTION     Family History:  Family History  Problem Relation Age of Onset  . Depression Mother   . Hypertension Mother   . Sleep apnea Mother   . Asthma Mother   . COPD Mother   . Diabetes Mother   . Breast cancer Maternal Aunt        20's   Family Psychiatric  History: depression Social History:  Social History   Substance and Sexual Activity  Alcohol Use No     Social History   Substance and Sexual Activity  Drug Use No    Social History   Socioeconomic History  . Marital status: Single    Spouse name: Not on file  . Number of children: Not on file  . Years of education: Not on file  . Highest education level: Not on file  Occupational History  . Not on file  Social Needs  . Financial resource strain: Not on file  . Food insecurity:    Worry: Not on file    Inability: Not on file  . Transportation needs:    Medical: Not on file    Non-medical: Not on file  Tobacco Use  . Smoking status: Former Smoker    Packs/day: 0.50    Years: 3.00    Pack years: 1.50    Types: Cigarettes    Last attempt to quit: 08/2016    Years since quitting: 1.3  . Smokeless tobacco: Never Used  Substance and Sexual Activity  . Alcohol use: No  . Drug use: No  . Sexual activity: Never    Birth control/protection: Injection  Lifestyle  . Physical activity:    Days per week: Not on file    Minutes per session: Not on file  . Stress: Not on file  Relationships  . Social connections:    Talks on phone: Not on file    Gets together: Not on file    Attends religious service: Not on file    Active member of club or organization: Not on file    Attends meetings of clubs or organizations: Not on file    Relationship status: Not on file  Other Topics Concern  . Not on file  Social History Narrative  . Not  on file   Additional Social History:                         Sleep: Fair  Appetite:  Fair  Current Medications: Current Facility-Administered Medications  Medication Dose Route Frequency Provider Last Rate Last Dose  . acetaminophen (TYLENOL) tablet 650 mg  650 mg Oral Q6H PRN [name], [name], MD   650 mg at 01/06/18 0802  . alum & mag hydroxide-simeth (MAALOX/MYLANTA) 200-200-20 MG/5ML suspension 30 mL  30 mL Oral Q4H PRN [name], [name], MD      . cloZAPine (CLOZARIL) tablet 150 mg  150 mg Oral QHS [name], [name], MD   150 mg at 01/05/18 2204  . docusate sodium (COLACE) capsule 100 mg  100 mg Oral BID [name], [name], MD   100 mg at 01/06/18 0800  . hydrOXYzine (ATARAX/VISTARIL) tablet 50 mg  50 mg Oral TID PRN [name], [name], MD  50 mg at 01/04/18 2147  . levothyroxine (SYNTHROID, LEVOTHROID) tablet 100 mcg  100 mcg Oral QAC breakfast [name], [name], MD   100 mcg at 01/06/18 0800  . lithium carbonate capsule 300 mg  300 mg Oral BID WC [name], [name], MD   300 mg at 01/06/18 0800  . loratadine (CLARITIN) tablet 10 mg  10 mg Oral Daily [name], [name], MD   10 mg at 01/06/18 0800  . magnesium hydroxide (MILK OF MAGNESIA) suspension 30 mL  30 mL Oral Daily PRN [name], [name], MD      . Oxcarbazepine (TRILEPTAL) tablet 300 mg  300 mg Oral BID [name], [name], MD   300 mg at 01/06/18 0800  . pantoprazole (PROTONIX) EC tablet 40 mg  40 mg Oral Daily [name], [name], MD   40 mg at 01/06/18 0800  . prazosin (MINIPRESS) capsule 2 mg  2 mg Oral QHS McNew, Tyson Babinski, MD   2 mg at 01/04/18 2141  . traZODone (DESYREL) tablet 50 mg  50 mg Oral QHS [name], [name], MD   50 mg at 01/05/18 2204  . venlafaxine XR (EFFEXOR-XR) 24 hr capsule 150 mg  150 mg Oral Q breakfast [name], [name], MD   150 mg at 01/06/18 0800    Lab Results: No results found for this or any previous visit (from the past 48 hour(s)).  Blood Alcohol level:  Lab Results  Component Value Date   ETH <10 01/02/2018    ETH <10 76/28/3151    Metabolic Disorder Labs: Lab Results  Component Value Date   HGBA1C 5.1 11/23/2017   MPG 99.67 11/23/2017   MPG 103 03/03/2016   Lab Results  Component Value Date   PROLACTIN 64.6 (H) 03/04/2016   PROLACTIN 15.8 06/16/2015   Lab Results  Component Value Date   CHOL 173 11/23/2017   TRIG 77 11/23/2017   HDL 41 11/23/2017   CHOLHDL 4.2 11/23/2017   VLDL 15 11/23/2017   LDLCALC 117 (H) 11/23/2017   LDLCALC 127 (H) 03/03/2016    Physical Findings: AIMS: Facial and Oral Movements Muscles of Facial Expression: None, normal Lips and Perioral Area: None, normal Jaw: None, normal Tongue: None, normal,Extremity Movements Upper (arms, wrists, hands, fingers): None, normal Lower (legs, knees, ankles, toes): None, normal, Trunk Movements Neck, shoulders, hips: None, normal, Overall Severity Severity of abnormal movements (highest score from questions above): None, normal Incapacitation due to abnormal movements: None, normal Patient's awareness of abnormal movements (rate only patient's report): No Awareness, Dental Status Current problems with teeth and/or dentures?: No Does patient usually wear dentures?: No  CIWA:  CIWA-Ar Total: 2 COWS:  COWS Total Score: 3  Musculoskeletal: Strength & Muscle Tone: within normal limits Gait & Station: normal Patient leans: N/A  Psychiatric Specialty Exam: Physical Exam  Nursing note and vitals reviewed. Psychiatric: Her speech is normal. Thought content normal. Her affect is blunt. She is slowed and actively hallucinating. Cognition and memory are impaired. She expresses impulsivity.    Review of Systems  Gastrointestinal: Positive for abdominal pain.  Genitourinary:       Mwnstrual bleeding and cramps for 2 months  Neurological: Negative.   Psychiatric/Behavioral: Positive for hallucinations.  All other systems reviewed and are negative.   Blood pressure 99/62, pulse (!) 102, temperature 98.6 F (37 C),  temperature source Oral, resp. rate 16, height 5\' 6"  (1.676 m), weight 95.3 kg, last menstrual period 01/02/2018, SpO2 99 %.Body mass index is 33.89 kg/m.  General Appearance: Casual  Eye Contact:  Good  Speech:  Clear and Coherent  Volume:  Normal  Mood:  Depressed  Affect:  Flat  Thought Process:  Goal Directed and Descriptions of Associations: Intact  Orientation:  Full (Time, Place, and Person)  Thought Content:  Hallucinations: Auditory  Suicidal Thoughts:  No  Homicidal Thoughts:  No  Memory:  Immediate;   Fair Recent;   Fair Remote;   Fair  Judgement:  Poor  Insight:  Lacking  Psychomotor Activity:  Decreased  Concentration:  Concentration: Fair and Attention Span: Fair  Recall:  [employer] of Knowledge:  Fair  Language:  Fair  Akathisia:  No  Handed:  Right  AIMS (if indicated):     Assets:  Communication Skills Desire for Improvement Financial Resources/Insurance Housing Physical Health Resilience Social Support  ADL's:  Intact  Cognition:  WNL  Sleep:  Number of Hours: 6     Treatment Plan Summary: Daily contact with patient to assess and evaluate symptoms and progress in treatment and Medication management   Melanie Bell is a 27 yo female admitted due to SI and [place]. She reports no changes in symptoms. She does not appear psychotic and has no delay in response at all despite having very loud and constant voices. She has long history of voicing AH especially when she is trying to get out of her group home. She continues to report SI and has long history of self harming behaviors. The new group home environment has likely triggered worsening emotional distress and reemergence of self harm thoughts. Will increase Clozapine today. I did speak with her guardian today who gives permission to do this.   Plan:  Schizoaffective disorder by history, borderline personality disorder -Increase Clozapine to 175 mg qhs -Continue Lithium 300 mg BID -Continue Effexor XR 150  mg daily -Continue Prazosin 2 mg qhs -Continue Trileptal 300 mg BID  Hypothyroid -Continue synthroid 100 mcg daily  #Abnormal uterine bleeding -consider OBGYN consult  HTN -Will stop Norvasc as her BP is normal without it  Dispo -Mother is legal guardian.   Orson Slick, MD 01/06/2018, 1:22 PM

## 2018-01-05 NOTE — Progress Notes (Signed)
Patient alert and oriented x 4, , her thoughts are organized and coherent. Patient denies SI/HI but endorses AH, she stated voices are command hallucination telling  her  to harm herself, but she explained no plan in mind.. Patient contracts for safety, more visual checks initiated and room is already close to nurses station. Patient's affect is flat but brightens upon approach, she is interacting appropriately with peers and staff no distress noted..15 minutes safety checks maintained, will continue to monitor closely.

## 2018-01-05 NOTE — Progress Notes (Signed)
Minipress pill withheld because blood pressure 105/73 and writer didn't feel safe giving it, will contact MD on call. Marland Kitchen

## 2018-01-05 NOTE — Plan of Care (Signed)
  Problem: Education: Goal: Ability to make informed decisions regarding treatment will improve Outcome: Progressing  Patient able to make informed decisions regarding treatment. 

## 2018-01-05 NOTE — [facility] Group Notes (Signed)
01/05/2018 1PM  Type of Therapy and Topic:  Group Therapy:  Feelings around Relapse and Recovery  Participation Level:  Active   Description of Group:    Patients in this group will discuss emotions they experience before and after a relapse. They will process how experiencing these feelings, or avoidance of experiencing them, relates to having a relapse. Facilitator will guide patients to explore emotions they have related to recovery. Patients will be encouraged to process which emotions are more powerful. They will be guided to discuss the emotional reaction significant others in their lives may have to patients' relapse or recovery. Patients will be assisted in exploring ways to respond to the emotions of others without this contributing to a relapse.  Therapeutic Goals: 1. Patient will identify two or more emotions that lead to a relapse for them 2. Patient will identify two emotions that result when they relapse 3. Patient will identify two emotions related to recovery 4. Patient will demonstrate ability to communicate their needs through discussion and/or role plays   Summary of Patient Progress: Patient attended free expressions group which included recreational play and music therapy. Patient interacted appropriately with other group members. Patient is still in the process of meeting their treatment goals.     Therapeutic Modalities:   Cognitive Behavioral Therapy Solution-Focused Therapy Assertiveness Training Relapse Prevention Therapy   Darin Engels, [place] 01/05/2018 2:28 PM

## 2018-01-05 NOTE — Progress Notes (Signed)
[facility] MD Progress Note  01/05/2018 12:19 PM Melanie Bell  MRN:  [id number] Subjective:  Per RN staff, pt has been denying SI. However this morning she states that she "feels the same." She reports she continues to have suicidal thoughts and AH telling her to harm herself. She is interacting with peers and enjoying her time here. She is sleeping well. She feels tired today and has not gone to groups today. She is organized in thoughts. Does not appear psychotic.   Principal Problem: Schizoaffective disorder, bipolar type ([place]) Diagnosis:   Patient Active Problem List   Diagnosis Date Noted  . Borderline personality disorder ([place]) [F60.3] 01/04/2018    Priority: High  . PTSD (post-traumatic stress disorder) [F43.10] 01/03/2018    Priority: High  . Schizoaffective disorder, bipolar type ([place]) [F25.0] 11/06/2014    Priority: High  . Suicidal ideation [R45.851] 01/02/2018  . Self-inflicted injury [K93.26] 11/23/2017  . ASCUS of cervix with negative high risk HPV [R87.610] 08/28/2017  . Malingering [Z76.5] 05/06/2016  . Foreign body aspiration [T17.900A]   . Other foreign object in esophagus causing other injury, subsequent encounter [T18.198D]   . Swallowed foreign body [T18.9XXA] 04/23/2016  . Foreign body ingestion [T18.9XXA] 04/21/2016  . Gastric foreign body [T18.2XXA]   . 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 Tobacco use disorder [F17.200] 09/30/2014  . Hypothyroidism [E03.9] 09/29/2014  . Hypertension [I10] 09/29/2014   Total Time spent with patient: 20 minutes  Past Psychiatric History: See H&P  Past Medical History:  Past Medical History:  Diagnosis Date  . Anxiety   . Asthma   . Depression   . GERD (gastroesophageal reflux disease)   . Hallucinations 09/30/2014   Sizoaffective  . Hyperlipidemia   . Hypertension   . Intentional ingestion of batteries 04/21/2016    2 AAA batteries and 1 thumb tack; intent to hurt herself/notes 04/21/2016  . Tardive dyskinesia 10/2014    recent onset    Past Surgical History:  Procedure Laterality Date  . ABDOMINAL SURGERY     "years ago" to remove foreign objects  . APPENDECTOMY    . BREAST EXCISIONAL BIOPSY Right 09/03/2007   surgical bx removed fibroadeoma  . BREAST LUMPECTOMY Right   . COLONOSCOPY WITH PROPOFOL N/A 09/10/2015   Procedure: COLONOSCOPY WITH PROPOFOL;  Surgeon: [name], MD;  Location[facility] ENDOSCOPY;  Service: Endoscopy;  Laterality: N/A;  . ESOPHAGOGASTRODUODENOSCOPY N/A 11/28/2014   Procedure: ESOPHAGOGASTRODUODENOSCOPY (EGD);  Surgeon: [name], MD;  Location: [facility] ENDOSCOPY;  Service: Endoscopy;  Laterality: N/A;  . ESOPHAGOGASTRODUODENOSCOPY N/A 02/21/2016   Procedure: ESOPHAGOGASTRODUODENOSCOPY (EGD);  Surgeon: Mauri Pole, MD;  Location: [facility] ENDOSCOPY;  Service: Endoscopy;  Laterality: N/A;  . ESOPHAGOGASTRODUODENOSCOPY N/A 04/21/2016   Procedure: ESOPHAGOGASTRODUODENOSCOPY (EGD);  Surgeon: Gatha Mayer, MD;  Location: [facility] ENDOSCOPY;  Service: Endoscopy;  Laterality: N/A;  . ESOPHAGOGASTRODUODENOSCOPY N/A 05/06/2016   Procedure: ESOPHAGOGASTRODUODENOSCOPY (EGD);  Surgeon: Jonathon Bellows, MD;  Location: [facility] ENDOSCOPY;  Service: Gastroenterology;  Laterality: N/A;  . ESOPHAGOGASTRODUODENOSCOPY N/A 06/13/2016   Procedure: ESOPHAGOGASTRODUODENOSCOPY (EGD);  Surgeon: [name], MD;  Location: [facility] ENDOSCOPY;  Service: Endoscopy;  Laterality: N/A;  . ESOPHAGOGASTRODUODENOSCOPY (EGD) WITH PROPOFOL N/A 02/29/2016   Procedure: ESOPHAGOGASTRODUODENOSCOPY (EGD) WITH PROPOFOL;  Surgeon: [name], MD;  Location: [facility] ENDOSCOPY;  Service: Endoscopy;  Laterality: N/A;  . LAPAROTOMY N/A 09/12/2015   Procedure: EXPLORATORY LAPAROTOMY;  Surgeon: Florene Glen, MD;  Location: [facility] ORS;  Service: General;  Laterality: N/A;  . SIGMOIDOSCOPY N/A 09/12/2015   Procedure:  SIGMOIDOSCOPY;  Surgeon: Florene Glen, MD;  Location: [facility] ORS;  Service: General;  Laterality: N/A;  . WISDOM TOOTH  EXTRACTION     Family History:  Family History  Problem Relation Age of Onset  . Depression Mother   . Hypertension Mother   . Sleep apnea Mother   . Asthma Mother   . COPD Mother   . Diabetes Mother   . Breast cancer Maternal Aunt        20's   Family Psychiatric  History: See H&P Social History:  Social History   Substance and Sexual Activity  Alcohol Use No     Social History   Substance and Sexual Activity  Drug Use No    Social History   Socioeconomic History  . Marital status: Single    Spouse name: Not on file  . Number of children: Not on file  . Years of education: Not on file  . Highest education level: Not on file  Occupational History  . Not on file  Social Needs  . Financial resource strain: Not on file  . Food insecurity:    Worry: Not on file    Inability: Not on file  . Transportation needs:    Medical: Not on file    Non-medical: Not on file  Tobacco Use  . Smoking status: Former Smoker    Packs/day: 0.50    Years: 3.00    Pack years: 1.50    Types: Cigarettes    Last attempt to quit: 08/2016    Years since quitting: 1.3  . Smokeless tobacco: Never Used  Substance and Sexual Activity  . Alcohol use: No  . Drug use: No  . Sexual activity: Never    Birth control/protection: Injection  Lifestyle  . Physical activity:    Days per week: Not on file    Minutes per session: Not on file  . Stress: Not on file  Relationships  . Social connections:    Talks on phone: Not on file    Gets together: Not on file    Attends religious service: Not on file    Active member of club or organization: Not on file    Attends meetings of clubs or organizations: Not on file    Relationship status: Not on file  Other Topics Concern  . Not on file  Social History Narrative  . Not on file   Additional Social History:                         Sleep: Good  Appetite:  Good  Current Medications: Current Facility-Administered Medications   Medication Dose Route Frequency Provider Last Rate Last Dose  . acetaminophen (TYLENOL) tablet 650 mg  650 mg Oral Q6H PRN [name], [name], MD   650 mg at 01/04/18 2149  . alum & mag hydroxide-simeth (MAALOX/MYLANTA) 200-200-20 MG/5ML suspension 30 mL  30 mL Oral Q4H PRN [name], [name], MD      . cloZAPine (CLOZARIL) tablet 150 mg  150 mg Oral QHS [name], [name], MD   150 mg at 01/04/18 2141  . docusate sodium (COLACE) capsule 100 mg  100 mg Oral BID [name], [name], MD   100 mg at 01/05/18 0750  . hydrOXYzine (ATARAX/VISTARIL) tablet 50 mg  50 mg Oral TID PRN [name], [name], MD   50 mg at 01/04/18 2147  . levothyroxine (SYNTHROID, LEVOTHROID) tablet 100 mcg  100 mcg Oral QAC breakfast [name],  [name], MD   100 mcg at 01/05/18 0750  . lithium carbonate capsule 300 mg  300 mg Oral BID WC [name], [name], MD   300 mg at 01/05/18 0750  . loratadine (CLARITIN) tablet 10 mg  10 mg Oral Daily [name], [name], MD   10 mg at 01/05/18 0750  . magnesium hydroxide (MILK OF MAGNESIA) suspension 30 mL  30 mL Oral Daily PRN [name], [name], MD      . Oxcarbazepine (TRILEPTAL) tablet 300 mg  300 mg Oral BID [name], [name], MD   300 mg at 01/05/18 0750  . pantoprazole (PROTONIX) EC tablet 40 mg  40 mg Oral Daily [name], [name], MD   40 mg at 01/05/18 0750  . prazosin (MINIPRESS) capsule 2 mg  2 mg Oral QHS Meng [place], Tyson Babinski, MD   2 mg at 01/04/18 2141  . traZODone (DESYREL) tablet 50 mg  50 mg Oral QHS [name], [name], MD   50 mg at 01/04/18 2147  . venlafaxine XR (EFFEXOR-XR) 24 hr capsule 150 mg  150 mg Oral Q breakfast [name], [name], MD   150 mg at 01/05/18 0750    Lab Results: No results found for this or any previous visit (from the past 48 hour(s)).  Blood Alcohol level:  Lab Results  Component Value Date   ETH <10 01/02/2018   ETH <10 34/19/6222    Metabolic Disorder Labs: Lab Results  Component Value Date   HGBA1C 5.1 11/23/2017   MPG 99.67 11/23/2017   MPG 103 03/03/2016   Lab Results   Component Value Date   PROLACTIN 64.6 (H) 03/04/2016   PROLACTIN 15.8 06/16/2015   Lab Results  Component Value Date   CHOL 173 11/23/2017   TRIG 77 11/23/2017   HDL 41 11/23/2017   CHOLHDL 4.2 11/23/2017   VLDL 15 11/23/2017   LDLCALC 117 (H) 11/23/2017   LDLCALC 127 (H) 03/03/2016    Physical Findings: AIMS: Facial and Oral Movements Muscles of Facial Expression: None, normal Lips and Perioral Area: None, normal Jaw: None, normal Tongue: None, normal,Extremity Movements Upper (arms, wrists, hands, fingers): None, normal Lower (legs, knees, ankles, toes): None, normal, Trunk Movements Neck, shoulders, hips: None, normal, Overall Severity Severity of abnormal movements (highest score from questions above): None, normal Incapacitation due to abnormal movements: None, normal Patient's awareness of abnormal movements (rate only patient's report): No Awareness, Dental Status Current problems with teeth and/or dentures?: No Does patient usually wear dentures?: No  CIWA:  CIWA-Ar Total: 2 COWS:  COWS Total Score: 3  Musculoskeletal: Strength & Muscle Tone: within normal limits Gait & Station: normal Patient leans: N/A  Psychiatric Specialty Exam: Physical Exam  Nursing note and vitals reviewed.   Review of Systems  All other systems reviewed and are negative.   Blood pressure 115/72, pulse 86, temperature 98.4 F (36.9 C), temperature source Oral, resp. rate 18, height 5\' 6"  (1.676 m), weight 95.3 kg, last menstrual period 01/02/2018, SpO2 100 %.Body mass index is 33.89 kg/m.  General Appearance: Casual  Eye Contact:  Fair  Speech:  Clear and Coherent  Volume:  Normal  Mood:  Depressed  Affect:  Flat  Thought Process:  Coherent and Goal Directed  Orientation:  Full (Time, Place, and Person)  Thought Content:  Hallucinations: Auditory  Suicidal Thoughts:  Yes.  with intent/plan  Homicidal Thoughts:  No  Memory:  Recent;   Fair  Judgement:  Impaired  Insight:   Lacking  Psychomotor  Activity:  Normal  Concentration:  Concentration: Fair  Recall:  [employer] of Knowledge:  Fair  Language:  Fair  Akathisia:  No      Assets:  Resilience  ADL's:  Intact  Cognition:  WNL  Sleep:  Number of Hours: 6     Treatment Plan Summary: 27 yo female admitted due to SI and [place]. She reports no changes in symptoms. She does not appear psychotic and has no delay in response at all despite having very loud and constant voices. She has long history of voicing AH especially when she is trying to get out of her group home. She continues to report SI and has long history of self harming behaviors. The new group home environment has likely triggered worsening emotional distress and reemergence of self harm thoughts. Will increase Clozapine today. I did speak with her guardian today who gives permission to do this.   Plan:  Schizoaffective disorder by history, borderline personality disorder -Increase Clozapine to 175 mg qhs -Continue Lithium 300 mg BID -Continue Effexor XR 150 mg daily -Continue Prazosin 2 mg qhs -Continue Trileptal 300 mg BID  Hypothyroid -Continue synthroid 100 mcg daily  HTN -Will stop Norvasc as her BP is normal without it  Dispo -Mother is legal guardian. I did speak with her today.  Marylin Crosby, MD 01/05/2018, 12:19 PM

## 2018-01-06 DIAGNOSIS — N938 Other specified abnormal uterine and vaginal bleeding: Secondary | ICD-10-CM

## 2018-01-06 NOTE — Progress Notes (Signed)
Patient alert and oriented x 4, her thoughts are organized and coherent. Patient denies SI/HI/AVH and contracts for safety.upon approach. Patient's affect is flat but brightens upon approach, she is interacting appropriately with peers and staff no distress noted..15 minutes safety checks maintained, will continue to monitor closely.

## 2018-01-06 NOTE — Plan of Care (Signed)
Data: Patient is appropriate and cooperative to assessment. Patient denies SI/HI/AVH. Patient has completed daily self inventory worksheet. Patient reports depression and anxiety during assessment today. Patient has complaints of abdominal pain related to menstrual cycle  and a pain rating of 6/10. Patient reports good sleep quality, appetite is good for the last 24 hours. Patient rates depression "8/10" , feelings of hopelessness "7/10" and anxiety "6/10" Patients goal for today is "not to isolate."  Action:  Q x 15 minute observation checks were completed for safety. Patient was provided with education on medications. Patient was offered support and encouragement. Patient was given scheduled medications. Patient  was encourage to attend groups, participate in unit activities and continue with plan of care.     Response: Patient is complaint with medications. Patient is receptive to treatment and safety maintained on unit.    Problem: Education: Goal: Ability to make informed decisions regarding treatment will improve Outcome: Progressing   Problem: Coping: Goal: Coping ability will improve Outcome: Progressing   Problem: Self-Concept: Goal: Ability to disclose and discuss suicidal ideas will improve Outcome: Progressing Goal: Will verbalize positive feelings about self Outcome: Progressing   Problem: Safety: Goal: Ability to remain free from injury will improve Outcome: Progressing   Problem: Self-Concept: Goal: Level of anxiety will decrease Outcome: Not Progressing

## 2018-01-06 NOTE — Progress Notes (Signed)
Patient ID: Melanie Bell, female   DOB: [birth year], 27 y.o.   MRN: [id number] Per State regulations 482.30 this chart was reviewed for medical necessity with respect to the patient's admission/duration of stay.    Next review date: 01/10/18  Debarah Crape, BSN, RN-BC  Case Manager

## 2018-01-06 NOTE — [facility] Group Notes (Signed)
[place] LCSW Group Therapy  01/06/2018 4:16 PM  Type of Therapy:  Group Therapy Emotional  Dysregulation  Participation Level:  Active  Participation Quality:  Attentive  Affect:  Appropriate  Cognitive:  Appropriate  Insight:  Engaged  Engagement in Therapy:  Engaged  Modes of Intervention:  Discussion, Education, Exploration and Socialization  Summary of Progress/Problems: LCSW for therapy group introduced the discussion with patient on emotional regulation in a family setting. Patient was asked to reflect on a family situation and how they reacted, engaged and coped during their family time.  This patient reflected she has a good relationship with family, but  She reports she feels very guilty at times for past actions and behaviors and this leads to depression and guilt. She reflects that talking to family and using deep breathing she can manage her emotions better. Pauletta Browns, Olon Russ M 01/06/2018, 4:16 PM

## 2018-01-06 NOTE — Plan of Care (Signed)
  Problem: Coping: Goal: Coping ability will improve Outcome: Progressing  Patient is coping effectively with staff.
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
## 2018-01-07 LAB — CBC WITH DIFFERENTIAL/PLATELET
BASOS ABS: 0 10*3/uL (ref 0–0.1)
Basophils Relative: 1 %
Eosinophils Absolute: 0.2 10*3/uL (ref 0–0.7)
Eosinophils Relative: 3 %
HEMATOCRIT: 36.3 % (ref 35.0–47.0)
HEMOGLOBIN: 12.2 g/dL (ref 12.0–16.0)
LYMPHS ABS: 1.5 10*3/uL (ref 1.0–3.6)
LYMPHS PCT: 29 %
MCH: 30.7 pg (ref 26.0–34.0)
MCHC: 33.8 g/dL (ref 32.0–36.0)
MCV: 90.9 fL (ref 80.0–100.0)
Monocytes Absolute: 0.4 10*3/uL (ref 0.2–0.9)
Monocytes Relative: 9 %
NEUTROS ABS: 3.1 10*3/uL (ref 1.4–6.5)
NEUTROS PCT: 58 %
Platelets: 198 10*3/uL (ref 150–440)
RBC: 3.99 MIL/uL (ref 3.80–5.20)
RDW: 13.7 % (ref 11.5–14.5)
WBC: 5.2 10*3/uL (ref 3.6–11.0)

## 2018-01-07 NOTE — [facility] Group Notes (Signed)
[facility] Group Notes:  (Nursing/MHT/Case Management/Adjunct)  Date:  01/07/2018  Time:  9:27 PM  Type of Therapy:  Group Therapy  Participation Level:  Active  Participation Quality:  Appropriate  Affect:  Appropriate  Cognitive:  Alert  Insight:  Good  Engagement in Group:  Engaged  Modes of Intervention:  Support  Summary of Progress/Problems:  Melanie Bell 01/07/2018, 9:27 PM

## 2018-01-07 NOTE — Plan of Care (Signed)
Data: Patient is appropriate and cooperative to assessment. Patient denies SI/HI/AVH. Patient has completed daily self inventory worksheet. Patient reports depression and anxiety during assessment.  Patient has complaints of menstrual cramps and a pain rating of 7/10. Patient reports good sleep quality, appetite is fair for the last 24 hours. Patient rates depression "6/10" , feelings of hopelessness "8/10" and anxiety "8/10" Patients goal for today is "try not to isolate."  Action:  Q x 15 minute observation checks were completed for safety. Patient was provided with education on medications. Patient was offered support and encouragement. Patient was given scheduled medications. Patient  was encourage to attend groups, participate in unit activities and continue with plan of care.     Response: Patient is cooperative with treatment. Patient has no complaints at this time. Patient is receptive to treatment and safety maintained on unit.    Problem: Self-Concept: Goal: Ability to disclose and discuss suicidal ideas will improve Outcome: Progressing Goal: Will verbalize positive feelings about self Outcome: Progressing   Problem: Safety: Goal: Ability to remain free from injury will improve Outcome: Progressing   Problem: Education: Goal: Ability to make informed decisions regarding treatment will improve Outcome: Not Progressing   Problem: Self-Concept: Goal: Level of anxiety will decrease Outcome: Not Progressing

## 2018-01-07 NOTE — Progress Notes (Signed)
Patient alert and oriented x 4, her thoughts are organized and coherent, she stated l feel less depressed rated it a 5/10 on a scale of ( 0-10) patient denies SI/HI/AVH and contracts for safety.upon approach. Patient's affect is flat but she brightens upon approach, she is interacting appropriately with peers and staff no distress noted..15 minutes safety checks maintained, will continue to monitor closely.

## 2018-01-07 NOTE — Plan of Care (Signed)
  Problem: Education: Goal: Ability to make informed decisions regarding treatment will improve Outcome: Progressing   Problem: Coping: Goal: Coping ability will improve Outcome: Progressing   Problem: Self-Concept: Goal: Ability to disclose and discuss suicidal ideas will improve Outcome: Progressing
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
## 2018-01-07 NOTE — Progress Notes (Signed)
27 yo female on Clozapine 150 mg PO daily at bedtime. [place] on 0825 at 0900 is 3100. Reported lab value to [facility]apine[facility] program. Ordered CBC with diff for 0901 @ 0900.  Pharmacy will continue to monitor.  Paticia Stack, PharmD Pharmacy Resident  01/07/2018 10:09 AM

## 2018-01-07 NOTE — Progress Notes (Signed)
Patient is pleasant, calm,  and cooperative. Patient denies SI/HI/AVH at present but does admit to having self harm thoughts at times.  Patient reports depression and anxiety during.   Complaints of menstrual cramps and a pain rating of 5/10, refused prn, reports it has not been helping the pain. Patient given nighttime medications as well as trazadone to help with sleep.  Action:  Q x 15 minute observation checks were completed for safety. Patient was provided with education on medications.Patient was offered support and encouragement. Patient was given scheduled medications. Patient  was encourage to attend groups, participate in unit activities and continue with plan of care.

## 2018-01-08 MED ORDER — CLOZAPINE 25 MG PO TABS
175.0000 mg | ORAL_TABLET | Freq: Every day | ORAL | Status: DC
  Administered 2018-01-08 – 2018-01-11 (×4): 175 mg via ORAL
  Filled 2018-01-08 (×4): qty 1

## 2018-01-08 NOTE — Tx Team (Signed)
Interdisciplinary Treatment and Diagnostic Plan Update  01/08/2018 Time of Session: 11am Melanie Bell MRN: [id number]  Principal Diagnosis: Schizoaffective disorder, bipolar type [facility])  Secondary Diagnoses: Principal Problem:   Schizoaffective disorder, bipolar type ([place]) Active Problems:   Hypothyroidism   Hypertension   Suicidal ideation   PTSD (post-traumatic stress disorder)   Borderline personality disorder ([place])   Dysfunctional uterine bleeding   Current Medications:  Current Facility-Administered Medications  Medication Dose Route Frequency Provider Last Rate Last Dose  . acetaminophen (TYLENOL) tablet 650 mg  650 mg Oral Q6H PRN [name], [name], MD   650 mg at 01/07/18 0753  . alum & mag hydroxide-simeth (MAALOX/MYLANTA) 200-200-20 MG/5ML suspension 30 mL  30 mL Oral Q4H PRN [name], [name], MD      . cloZAPine (CLOZARIL) tablet 150 mg  150 mg Oral QHS [name], [name], MD   150 mg at 01/07/18 2121  . docusate sodium (COLACE) capsule 100 mg  100 mg Oral BID [name], [name], MD   100 mg at 01/08/18 0848  . hydrOXYzine (ATARAX/VISTARIL) tablet 50 mg  50 mg Oral TID PRN [name], [name], MD   50 mg at 01/04/18 2147  . levothyroxine (SYNTHROID, LEVOTHROID) tablet 100 mcg  100 mcg Oral QAC breakfast [name], [name], MD   100 mcg at 01/08/18 0848  . lithium carbonate capsule 300 mg  300 mg Oral BID WC [name], [name], MD   300 mg at 01/08/18 0848  . loratadine (CLARITIN) tablet 10 mg  10 mg Oral Daily [name], [name], MD   10 mg at 01/08/18 0848  . magnesium hydroxide (MILK OF MAGNESIA) suspension 30 mL  30 mL Oral Daily PRN [name], [name], MD      . Oxcarbazepine (TRILEPTAL) tablet 300 mg  300 mg Oral BID [name], [name], MD   300 mg at 01/08/18 0847  . pantoprazole (PROTONIX) EC tablet 40 mg  40 mg Oral Daily [name], [name], MD   40 mg at 01/08/18 0849  . prazosin (MINIPRESS) capsule 2 mg  2 mg Oral QHS McNew, Tyson Babinski, MD   2 mg at 01/07/18 2121  . traZODone (DESYREL)  tablet 50 mg  50 mg Oral QHS [name], [name], MD   50 mg at 01/07/18 2122  . venlafaxine XR (EFFEXOR-XR) 24 hr capsule 150 mg  150 mg Oral Q breakfast [name], [name], MD   150 mg at 01/08/18 0848   PTA Medications: Medications Prior to Admission  Medication Sig Dispense Refill Last Dose  . amLODipine (NORVASC) 2.5 MG tablet Take 1 tablet (2.5 mg total) by mouth daily. 30 tablet 0 12/16/2017 at 0800  . cetirizine (ZYRTEC) 10 MG tablet Take 1 tablet (10 mg total) by mouth daily as needed for allergies. 30 tablet 1 PRN at PRN  . clozapine (CLOZARIL) 50 MG tablet Take 3 tablets (150 mg total) by mouth at bedtime. (Patient taking differently: Take 100 mg by mouth at bedtime. ) 90 tablet 1 12/15/2017 at 2000  . docusate sodium (COLACE) 100 MG capsule Take 1 capsule (100 mg total) by mouth daily as needed. 30 capsule 0   . hydrOXYzine (ATARAX/VISTARIL) 50 MG tablet Take 1 tablet (50 mg total) by mouth 2 (two) times daily as needed. 90 tablet 1 PRN at PRN  . levothyroxine (SYNTHROID, LEVOTHROID) 100 MCG tablet Take 1 tablet (100 mcg total) by mouth daily before breakfast. 30 tablet 1 12/16/2017 at 0700  . lithium carbonate 300 MG  capsule Take 1 capsule (300 mg total) by mouth 2 (two) times daily with a meal. 60 capsule 1 12/16/2017 at 0800  . medroxyPROGESTERone (DEPO-PROVERA) 150 MG/ML injection Inject 1 mL (150 mg total) into the muscle every 3 (three) months. 1 mL 1 Past Month at Unknown time  . omeprazole (PRILOSEC) 20 MG capsule Take 1 capsule (20 mg total) by mouth daily. 30 capsule 1 12/16/2017 at 0800  . OXcarbazepine (TRILEPTAL) 300 MG tablet Take 1 tablet (300 mg total) by mouth 2 (two) times daily. 60 tablet 1 12/16/2017 at 0800  . polyethylene glycol (MIRALAX / GLYCOLAX) packet Take 17 g by mouth daily as needed. 30 each 1 PRN at PRN  . prazosin (MINIPRESS) 1 MG capsule Take 1 mg by mouth at bedtime.   12/15/2017 at 2000  . senna (SENOKOT) 8.6 MG TABS tablet Take 1 tablet (8.6 mg total) by mouth daily as  needed for mild constipation. 120 each 0 PRN at PRN  . traZODone (DESYREL) 50 MG tablet Take 1 tablet (50 mg total) by mouth at bedtime. 30 tablet 1 12/15/2017 at 2000  . venlafaxine XR (EFFEXOR-XR) 150 MG 24 hr capsule Take 1 capsule (150 mg total) by mouth daily with breakfast. 30 capsule 1 12/16/2017 at 0800  . Vitamin D, Ergocalciferol, (DRISDOL) 50000 units CAPS capsule Take 1 capsule (50,000 Units total) by mouth every 7 (seven) days. 4 capsule 1 12/13/2017 at 0800    Patient Stressors: Financial difficulties Health problems Medication change or noncompliance  Patient Strengths: Average or above average intelligence Capable of independent living Motivation for treatment/growth Supportive family/friends  Treatment Modalities: Medication Management, Group therapy, Case management,  1 to 1 session with clinician, Psychoeducation, Recreational therapy.   Physician Treatment Plan for Primary Diagnosis: Schizoaffective disorder, bipolar type ([place]) Long Term Goal(s): Improvement in symptoms so as ready for discharge   Short Term Goals: Ability to demonstrate self-control will improve  Medication Management: Evaluate patient's response, side effects, and tolerance of medication regimen.  Therapeutic Interventions: 1 to 1 sessions, Unit Group sessions and Medication administration.  Evaluation of Outcomes: Progressing  Physician Treatment Plan for Secondary Diagnosis: Principal Problem:   Schizoaffective disorder, bipolar type ([place]) Active Problems:   Hypothyroidism   Hypertension   Suicidal ideation   PTSD (post-traumatic stress disorder)   Borderline personality disorder ([place])   Dysfunctional uterine bleeding  Long Term Goal(s): Improvement in symptoms so as ready for discharge   Short Term Goals: Ability to demonstrate self-control will improve     Medication Management: Evaluate patient's response, side effects, and tolerance of medication regimen.  Therapeutic  Interventions: 1 to 1 sessions, Unit Group sessions and Medication administration.  Evaluation of Outcomes: Progressing   RN Treatment Plan for Primary Diagnosis: Schizoaffective disorder, bipolar type ([place]) Long Term Goal(s): Knowledge of disease and therapeutic regimen to maintain health will improve  Short Term Goals: Ability to verbalize feelings will improve, Ability to identify and develop effective coping behaviors will improve and Compliance with prescribed medications will improve  Medication Management: RN will administer medications as ordered by provider, will assess and evaluate patient's response and provide education to patient for prescribed medication. RN will report any adverse and/or side effects to prescribing provider.  Therapeutic Interventions: 1 on 1 counseling sessions, Psychoeducation, Medication administration, Evaluate responses to treatment, Monitor vital signs and CBGs as ordered, Perform/monitor CIWA, COWS, AIMS and Fall Risk screenings as ordered, Perform wound care treatments as ordered.  Evaluation of Outcomes: Progressing   LCSW Treatment  Plan for Primary Diagnosis: Schizoaffective disorder, bipolar type ([place]) Long Term Goal(s): Safe transition to appropriate next level of care at discharge, Engage patient in therapeutic group addressing interpersonal concerns.  Short Term Goals: Engage patient in aftercare planning with referrals and resources, Increase ability to appropriately verbalize feelings, Increase emotional regulation, Identify triggers associated with mental health/substance abuse issues and Increase skills for wellness and recovery  Therapeutic Interventions: Assess for all discharge needs, 1 to 1 time with Social worker, Explore available resources and support systems, Assess for adequacy in community support network, Educate family and significant other(s) on suicide prevention, Complete Psychosocial Assessment, Interpersonal group  therapy.  Evaluation of Outcomes: Progressing   Progress in Treatment: Attending groups: Yes. Participating in groups: Yes. Taking medication as prescribed: Yes. Toleration medication: Yes. Family/Significant other contact made: Yes, individual(s) contacted:  Melanie Bell Patient understands diagnosis: Yes. Discussing patient identified problems/goals with staff: Yes. Medical problems stabilized or resolved: Yes. Denies suicidal/homicidal ideation: Yes. Issues/concerns per patient self-inventory: No. Other:   New problem(s) identified: No, Describe:  None  New Short Term/Long Term Goal(s): "To get the voices and depression under control."  Patient Goals:   "To get the voices and depression under control."  Discharge Plan or Barriers: To return back to your group home and follow up with your ACTT Team.  Reason for Continuation of Hospitalization: Depression Hallucinations Medication stabilization  Estimated Length of Stay: 3-5 days  Recreational Therapy: Patient Stressors: N/A Patient Goal: Patient will identify 3 positive coping skills to decrease depressive symptoms within 5 recreation therapy group sessions  Attendees: Patient:  01/08/2018 11:56 AM  Physician: Amador Cunas, MD 01/08/2018 11:56 AM  Nursing:  01/08/2018 11:56 AM  RN Care Manager: 01/08/2018 11:56 AM  Social Worker: Darin Engels, [place] 01/08/2018 11:56 AM  Recreational Therapist: Isaias Sakai. Marcello Fennel, LRT 01/08/2018 11:56 AM  Other: Nance Pew, LCSW 01/08/2018 11:56 AM  Other: Marney Doctor, [place] 01/08/2018 11:56 AM  Other: 01/08/2018 11:56 AM    Scribe for Treatment Team: Darin Engels, LCSW 01/08/2018 11:56 AM
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
## 2018-01-08 NOTE — Progress Notes (Signed)
Recreation Therapy Notes  Date: 01/08/2018  Time: 9:30 am   Location: Craft Room   Behavioral response: N/A   Intervention Topic: Problem Solving  Discussion/Intervention: Patient did not attend group.   Clinical Observations/Feedback:  Patient did not attend group.   Keita Demarco LRT/CTRS        Harm Jou 01/08/2018 10:23 AM

## 2018-01-08 NOTE — Plan of Care (Signed)
Patient verbalized that she still  hear voices and telling her to hurt herself.Patient contracts for safety.Patients affect is brighter and interacts with staff & peers.Compliant with medications.Attended groups.Compliant with medications.Support and encouragement given.

## 2018-01-08 NOTE — Progress Notes (Signed)
Patient is stable and slept through the night without any issues, sleep for 6.30 hrs.no distress.

## 2018-01-08 NOTE — Plan of Care (Signed)
Patient is responding well to treatment regimen, in the milieu area socializing with peers and participating in groups and interact appropriately with peers, continues  to endorse depression and anxiety 5/10, affect is bright, contract for safety, compliant with her medications , with out any noticeable side effects, denies any SI/HI and no signs of AVH, sleep long hours without interactions, safety Is maintained , 15 minute safety rounding is in progress. Problem: Education: Goal: Ability to make informed decisions regarding treatment will improve Outcome: Progressing   Problem: Coping: Goal: Coping ability will improve Outcome: Progressing   Problem: Self-Concept: Goal: Ability to disclose and discuss suicidal ideas will improve Outcome: Progressing Goal: Will verbalize positive feelings about self Outcome: Progressing   Problem: Self-Concept: Goal: Will verbalize positive feelings about self Outcome: Progressing   Problem: Education: Goal: Utilization of techniques to improve thought processes will improve Outcome: Progressing Goal: Knowledge of the prescribed therapeutic regimen will improve Outcome: Progressing   Problem: Self-Concept: Goal: Ability to identify factors that promote anxiety will improve Outcome: Progressing Goal: Level of anxiety will decrease Outcome: Progressing Goal: Ability to modify response to factors that promote anxiety will improve Outcome: Progressing   Problem: Physical Regulation: Goal: Complications related to the disease process, condition or treatment will be avoided or minimized Outcome: Progressing   Problem: Safety: Goal: Ability to remain free from injury will improve Outcome: Progressing   Problem: Education: Goal: Knowledge of General Education information will improve Description Including pain rating scale, medication(s)/side effects and non-pharmacologic comfort measures Outcome: Progressing   Problem: Health  Behavior/Discharge Planning: Goal: Ability to manage health-related needs will improve Outcome: Progressing   Problem: Clinical Measurements: Goal: Ability to maintain clinical measurements within normal limits will improve Outcome: Progressing Goal: Will remain free from infection Outcome: Progressing Goal: Diagnostic test results will improve Outcome: Progressing Goal: Respiratory complications will improve Outcome: Progressing Goal: Cardiovascular complication will be avoided Outcome: Progressing   Problem: Activity: Goal: Risk for activity intolerance will decrease Outcome: Progressing   Problem: Nutrition: Goal: Adequate nutrition will be maintained Outcome: Progressing   Problem: Coping: Goal: Level of anxiety will decrease Outcome: Progressing   Problem: Elimination: Goal: Will not experience complications related to bowel motility Outcome: Progressing Goal: Will not experience complications related to urinary retention Outcome: Progressing   Problem: Pain Managment: Goal: General experience of comfort will improve Outcome: Progressing   Problem: Safety: Goal: Ability to remain free from injury will improve Outcome: Progressing   Problem: Skin Integrity: Goal: Risk for impaired skin integrity will decrease Outcome: Progressing   Problem: Education: Goal: Ability to make informed decisions regarding treatment will improve Outcome: Progressing   Problem: Coping: Goal: Coping ability will improve Outcome: Progressing   Problem: Self-Concept: Goal: Ability to disclose and discuss suicidal ideas will improve Outcome: Progressing Goal: Will verbalize positive feelings about self Outcome: Progressing   Problem: Self-Concept: Goal: Will verbalize positive feelings about self Outcome: Progressing   Problem: Education: Goal: Utilization of techniques to improve thought processes will improve Outcome: Progressing Goal: Knowledge of the prescribed  therapeutic regimen will improve Outcome: Progressing   Problem: Self-Concept: Goal: Ability to identify factors that promote anxiety will improve Outcome: Progressing Goal: Level of anxiety will decrease Outcome: Progressing Goal: Ability to modify response to factors that promote anxiety will improve Outcome: Progressing   Problem: Physical Regulation: Goal: Complications related to the disease process, condition or treatment will be avoided or minimized Outcome: Progressing   Problem: Safety: Goal: Ability to remain free from injury will  improve Outcome: Progressing   Problem: Education: Goal: Knowledge of General Education information will improve Description Including pain rating scale, medication(s)/side effects and non-pharmacologic comfort measures Outcome: Progressing   Problem: Health Behavior/Discharge Planning: Goal: Ability to manage health-related needs will improve Outcome: Progressing   Problem: Clinical Measurements: Goal: Ability to maintain clinical measurements within normal limits will improve Outcome: Progressing Goal: Will remain free from infection Outcome: Progressing Goal: Diagnostic test results will improve Outcome: Progressing Goal: Respiratory complications will improve Outcome: Progressing Goal: Cardiovascular complication will be avoided Outcome: Progressing   Problem: Activity: Goal: Risk for activity intolerance will decrease Outcome: Progressing   Problem: Nutrition: Goal: Adequate nutrition will be maintained Outcome: Progressing   Problem: Coping: Goal: Level of anxiety will decrease Outcome: Progressing   Problem: Elimination: Goal: Will not experience complications related to bowel motility Outcome: Progressing Goal: Will not experience complications related to urinary retention Outcome: Progressing   Problem: Pain Managment: Goal: General experience of comfort will improve Outcome: Progressing   Problem:  Safety: Goal: Ability to remain free from injury will improve Outcome: Progressing   Problem: Skin Integrity: Goal: Risk for impaired skin integrity will decrease Outcome: Progressing   Problem: Education: Goal: Ability to make informed decisions regarding treatment will improve Outcome: Progressing   Problem: Coping: Goal: Coping ability will improve Outcome: Progressing   Problem: Self-Concept: Goal: Ability to disclose and discuss suicidal ideas will improve Outcome: Progressing Goal: Will verbalize positive feelings about self Outcome: Progressing   Problem: Self-Concept: Goal: Will verbalize positive feelings about self Outcome: Progressing   Problem: Education: Goal: Utilization of techniques to improve thought processes will improve Outcome: Progressing Goal: Knowledge of the prescribed therapeutic regimen will improve Outcome: Progressing   Problem: Self-Concept: Goal: Ability to identify factors that promote anxiety will improve Outcome: Progressing Goal: Level of anxiety will decrease Outcome: Progressing Goal: Ability to modify response to factors that promote anxiety will improve Outcome: Progressing   Problem: Physical Regulation: Goal: Complications related to the disease process, condition or treatment will be avoided or minimized Outcome: Progressing   Problem: Safety: Goal: Ability to remain free from injury will improve Outcome: Progressing   Problem: Education: Goal: Knowledge of General Education information will improve Description Including pain rating scale, medication(s)/side effects and non-pharmacologic comfort measures Outcome: Progressing   Problem: Health Behavior/Discharge Planning: Goal: Ability to manage health-related needs will improve Outcome: Progressing   Problem: Clinical Measurements: Goal: Ability to maintain clinical measurements within normal limits will improve Outcome: Progressing Goal: Will remain free from  infection Outcome: Progressing Goal: Diagnostic test results will improve Outcome: Progressing Goal: Respiratory complications will improve Outcome: Progressing Goal: Cardiovascular complication will be avoided Outcome: Progressing   Problem: Activity: Goal: Risk for activity intolerance will decrease Outcome: Progressing   Problem: Nutrition: Goal: Adequate nutrition will be maintained Outcome: Progressing   Problem: Coping: Goal: Level of anxiety will decrease Outcome: Progressing   Problem: Elimination: Goal: Will not experience complications related to bowel motility Outcome: Progressing Goal: Will not experience complications related to urinary retention Outcome: Progressing   Problem: Pain Managment: Goal: General experience of comfort will improve Outcome: Progressing   Problem: Safety: Goal: Ability to remain free from injury will improve Outcome: Progressing   Problem: Skin Integrity: Goal: Risk for impaired skin integrity will decrease Outcome: Progressing

## 2018-01-09 NOTE — [facility] Group Notes (Signed)
[facility] Group Notes:  (Nursing/MHT/Case Management/Adjunct)  Date:  01/09/2018  Time:  12:05 AM  Type of Therapy:  Group Therapy  Participation Level:  Active  Participation Quality:  Appropriate  Affect:  Appropriate  Cognitive:  Appropriate  Insight:  Appropriate  Engagement in Group:  Engaged  Modes of Intervention:  Discussion  Summary of Progress/Problems: Melanie Bell stated she was trying not to isolate in her room. "I didn't get up until lunch time." Melanie Bell stated she needed to continue working on getting up early and not isolating.  [name] 01/09/2018, 12:05 AM

## 2018-01-09 NOTE — Plan of Care (Signed)
Patient stated that she had a "better day" the voices are better.Denies SI,HI and AVH.Patient visible in the milieu.Attended groups.Compliant with medications.Appetite and energy level good.Support and encouragement given.

## 2018-01-09 NOTE — Progress Notes (Signed)
Recreation Therapy Notes  Date: 01/09/2018  Time: 9:30 am  Location: Craft Room  Behavioral response: Appropriate   Intervention Topic: Communication  Discussion/Intervention:  Group content today was focused on communication. The group defined communication and ways to communicate with others. Individuals stated reason why communication is important and some reasons to communicate with others. Patients expressed if they thought they were good at communicating with others and ways they could improve their communication skills. The group identified important parts of communication and some experiences they have had in the past with communication. The group participated in the intervention "What is that?", where they had a chance to test out their communication skills and identify ways to improve their communication techniques.  .  Clinical Observations/Feedback:  Patient attended group and stated that communication is made easier by electronic like phones and computers. She explained that one can be assertive in communication with out being aggressive. Individual was social with peers and staff while participating in group.  Kolton Kienle LRT/CTRS         Corrado Hymon 01/09/2018 11:36 AM

## 2018-01-09 NOTE — [facility] Group Notes (Signed)
[facility] Group Notes:  (Nursing/MHT/Case Management/Adjunct)  Date:  01/09/2018  Time:  9:05 PM  Type of Therapy:  Group Therapy  Participation Level:  Active  Participation Quality:  Appropriate  Affect:  Appropriate  Cognitive:  Appropriate  Insight:  Good  Engagement in Group:  Engaged  Modes of Intervention:  Support  Summary of Progress/Problems:  Melanie Bell 01/09/2018, 9:05 PM

## 2018-01-09 NOTE — [facility] Group Notes (Signed)
01/09/2018 1PM  Type of Therapy/Topic:  Group Therapy:  Feelings about Diagnosis  Participation Level:  Active   Description of Group:   This group will allow patients to explore their thoughts and feelings about diagnoses they have received. Patients will be guided to explore their level of understanding and acceptance of these diagnoses. Facilitator will encourage patients to process their thoughts and feelings about the reactions of others to their diagnosis and will guide patients in identifying ways to discuss their diagnosis with significant others in their lives. This group will be process-oriented, with patients participating in exploration of their own experiences, giving and receiving support, and processing challenge from other group members.   Therapeutic Goals: 1. Patient will demonstrate understanding of diagnosis as evidenced by identifying two or more symptoms of the disorder 2. Patient will be able to express two feelings regarding the diagnosis 3. Patient will demonstrate their ability to communicate their needs through discussion and/or role play  Summary of Patient Progress: Actively and appropriately engaged in the group. Patient was able to provide support and validation to other group members.Patient practiced active listening when interacting with the facilitator and other group members. Patient reports having symptoms of "voices and depression" that brought her into the hospital. She says "I need to tell someone right away when I need help." Patient is still in the process of obtaining treatment goals.        Therapeutic Modalities:   Cognitive Behavioral Therapy Brief Therapy Feelings Identification    Darin Engels, LCSW 01/09/2018 2:28 PM

## 2018-01-09 NOTE — Progress Notes (Signed)
[facility] MD Progress Note  01/09/2018 2:33 PM Melanie Bell  MRN:  [id number] Subjective:  Pt has brighter affect today. She is smiling during interview. She stats that her mood is slightly better and is trying to keep busy. She was up earlier today and did not sleep most of the morning which she is proud of. She is having less intrusive thoughts of harming herself and is trying to keep very busy to distract herself. [facility] is even starting to brainstorm a crisis plan for discharge and things she can do to stay safe. She names a few things such as call the ACT team crisis line, journal, and write poetry. She states that one of her poetry writings were published and wants to get back into doing this. She is much more future oriented. She also wants to continue to pursue cosmetology and this will keep her busy. She continues to reports SI but are much less frequent. Voices are also improving.   Principal Problem: Schizoaffective disorder, bipolar type ([place]) Diagnosis:   Patient Active Problem List   Diagnosis Date Noted  . Borderline personality disorder ([place]) [F60.3] 01/04/2018    Priority: High  . PTSD (post-traumatic stress disorder) [F43.10] 01/03/2018    Priority: High  . Schizoaffective disorder, bipolar type ([place]) [F25.0] 11/06/2014    Priority: High  . Dysfunctional uterine bleeding [N93.8] 01/06/2018  . Suicidal ideation [R45.851] 01/02/2018  . Self-inflicted injury [L57.26] 11/23/2017  . ASCUS of cervix with negative high risk HPV [R87.610] 08/28/2017  . Malingering [Z76.5] 05/06/2016  . Foreign body aspiration [T17.900A]   . Other foreign object in esophagus causing other injury, subsequent encounter [T18.198D]   . Swallowed foreign body [T18.9XXA] 04/23/2016  . Foreign body ingestion [T18.9XXA] 04/21/2016  . Gastric foreign body [T18.2XXA]   . 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 Tobacco use disorder [F17.200] 09/30/2014  . Hypothyroidism [E03.9] 09/29/2014  . Hypertension [I10]  09/29/2014   Total Time spent with patient: 20 minutes  Past Psychiatric History: See H&P  Past Medical History:  Past Medical History:  Diagnosis Date  . Anxiety   . Asthma   . Depression   . GERD (gastroesophageal reflux disease)   . Hallucinations 09/30/2014   Sizoaffective  . Hyperlipidemia   . Hypertension   . Intentional ingestion of batteries 04/21/2016    2 AAA batteries and 1 thumb tack; intent to hurt herself/notes 04/21/2016  . Tardive dyskinesia 10/2014   recent onset    Past Surgical History:  Procedure Laterality Date  . ABDOMINAL SURGERY     "years ago" to remove foreign objects  . APPENDECTOMY    . BREAST EXCISIONAL BIOPSY Right 09/03/2007   surgical bx removed fibroadeoma  . BREAST LUMPECTOMY Right   . COLONOSCOPY WITH PROPOFOL N/A 09/10/2015   Procedure: COLONOSCOPY WITH PROPOFOL;  Surgeon: [name], MD;  Location: [facility] ENDOSCOPY;  Service: Endoscopy;  Laterality: N/A;  . ESOPHAGOGASTRODUODENOSCOPY N/A 11/28/2014   Procedure: ESOPHAGOGASTRODUODENOSCOPY (EGD);  Surgeon: [name], MD;  Location: [facility] ENDOSCOPY;  Service: Endoscopy;  Laterality: N/A;  . ESOPHAGOGASTRODUODENOSCOPY N/A 02/21/2016   Procedure: ESOPHAGOGASTRODUODENOSCOPY (EGD);  Surgeon: Mauri Pole, MD;  Location: [facility] ENDOSCOPY;  Service: Endoscopy;  Laterality: N/A;  . ESOPHAGOGASTRODUODENOSCOPY N/A 04/21/2016   Procedure: ESOPHAGOGASTRODUODENOSCOPY (EGD);  Surgeon: Gatha Mayer, MD;  Location: [facility] ENDOSCOPY;  Service: Endoscopy;  Laterality: N/A;  . ESOPHAGOGASTRODUODENOSCOPY N/A 05/06/2016   Procedure: ESOPHAGOGASTRODUODENOSCOPY (EGD);  Surgeon: Jonathon Bellows, MD;  Location: [facility] ENDOSCOPY;  Service: Gastroenterology;  Laterality: N/A;  .  ESOPHAGOGASTRODUODENOSCOPY N/A 06/13/2016   Procedure: ESOPHAGOGASTRODUODENOSCOPY (EGD);  Surgeon: [name], MD;  Location: [facility] ENDOSCOPY;  Service: Endoscopy;  Laterality: N/A;  . ESOPHAGOGASTRODUODENOSCOPY (EGD) WITH PROPOFOL N/A 02/29/2016    Procedure: ESOPHAGOGASTRODUODENOSCOPY (EGD) WITH PROPOFOL;  Surgeon: [name], MD;  Location: [facility] ENDOSCOPY;  Service: Endoscopy;  Laterality: N/A;  . LAPAROTOMY N/A 09/12/2015   Procedure: EXPLORATORY LAPAROTOMY;  Surgeon: Florene Glen, MD;  Location: [facility] ORS;  Service: General;  Laterality: N/A;  . SIGMOIDOSCOPY N/A 09/12/2015   Procedure: Lonell Face;  Surgeon: Florene Glen, MD;  Location: [facility] ORS;  Service: General;  Laterality: N/A;  . WISDOM TOOTH EXTRACTION     Family History:  Family History  Problem Relation Age of Onset  . Depression Mother   . Hypertension Mother   . Sleep apnea Mother   . Asthma Mother   . COPD Mother   . Diabetes Mother   . Breast cancer Maternal Aunt        20's   Family Psychiatric  History: See H&P Social History:  Social History   Substance and Sexual Activity  Alcohol Use No     Social History   Substance and Sexual Activity  Drug Use No    Social History   Socioeconomic History  . Marital status: Single    Spouse name: Not on file  . Number of children: Not on file  . Years of education: Not on file  . Highest education level: Not on file  Occupational History  . Not on file  Social Needs  . Financial resource strain: Not on file  . Food insecurity:    Worry: Not on file    Inability: Not on file  . Transportation needs:    Medical: Not on file    Non-medical: Not on file  Tobacco Use  . Smoking status: Former Smoker    Packs/day: 0.50    Years: 3.00    Pack years: 1.50    Types: Cigarettes    Last attempt to quit: 08/2016    Years since quitting: 1.4  . Smokeless tobacco: Never Used  Substance and Sexual Activity  . Alcohol use: No  . Drug use: No  . Sexual activity: Never    Birth control/protection: Injection  Lifestyle  . Physical activity:    Days per week: Not on file    Minutes per session: Not on file  . Stress: Not on file  Relationships  . Social connections:    Talks on phone: Not on  file    Gets together: Not on file    Attends religious service: Not on file    Active member of club or organization: Not on file    Attends meetings of clubs or organizations: Not on file    Relationship status: Not on file  Other Topics Concern  . Not on file  Social History Narrative  . Not on file   Additional Social History:                         Sleep: Good  Appetite:  Good  Current Medications: Current Facility-Administered Medications  Medication Dose Route Frequency Provider Last Rate Last Dose  . acetaminophen (TYLENOL) tablet 650 mg  650 mg Oral Q6H PRN [name], [name], MD   650 mg at 01/07/18 0753  . alum & mag hydroxide-simeth (MAALOX/MYLANTA) 200-200-20 MG/5ML suspension 30 mL  30 mL Oral Q4H PRN [name], [name], MD      .  cloZAPine (CLOZARIL) tablet 175 mg  175 mg Oral QHS Marylin Crosby, MD   175 mg at 01/08/18 2137  . docusate sodium (COLACE) capsule 100 mg  100 mg Oral BID [name], [name], MD   100 mg at 01/09/18 9735  . hydrOXYzine (ATARAX/VISTARIL) tablet 50 mg  50 mg Oral TID PRN [name], [name], MD   50 mg at 01/04/18 2147  . levothyroxine (SYNTHROID, LEVOTHROID) tablet 100 mcg  100 mcg Oral QAC breakfast [name], [name], MD   100 mcg at 01/09/18 [phone]  . lithium carbonate capsule 300 mg  300 mg Oral BID WC [name], [name], MD   300 mg at 01/09/18 0823  . loratadine (CLARITIN) tablet 10 mg  10 mg Oral Daily [name], [name], MD   10 mg at 01/09/18 2426  . magnesium hydroxide (MILK OF MAGNESIA) suspension 30 mL  30 mL Oral Daily PRN [name], [name], MD   30 mL at 01/08/18 1717  . Oxcarbazepine (TRILEPTAL) tablet 300 mg  300 mg Oral BID [name], [name], MD   300 mg at 01/09/18 0823  . pantoprazole (PROTONIX) EC tablet 40 mg  40 mg Oral Daily [name], [name], MD   40 mg at 01/09/18 0823  . prazosin (MINIPRESS) capsule 2 mg  2 mg Oral QHS Kayode Petion, Tyson Babinski, MD   2 mg at 01/08/18 2138  . traZODone (DESYREL) tablet 50 mg  50 mg Oral QHS [name], [name], MD    50 mg at 01/08/18 2138  . venlafaxine XR (EFFEXOR-XR) 24 hr capsule 150 mg  150 mg Oral Q breakfast [name], [name], MD   150 mg at 01/09/18 8341    Lab Results: No results found for this or any previous visit (from the past 48 hour(s)).  Blood Alcohol level:  Lab Results  Component Value Date   ETH <10 01/02/2018   ETH <10 96/22/2979    Metabolic Disorder Labs: Lab Results  Component Value Date   HGBA1C 5.1 11/23/2017   MPG 99.67 11/23/2017   MPG 103 03/03/2016   Lab Results  Component Value Date   PROLACTIN 64.6 (H) 03/04/2016   PROLACTIN 15.8 06/16/2015   Lab Results  Component Value Date   CHOL 173 11/23/2017   TRIG 77 11/23/2017   HDL 41 11/23/2017   CHOLHDL 4.2 11/23/2017   VLDL 15 11/23/2017   LDLCALC 117 (H) 11/23/2017   LDLCALC 127 (H) 03/03/2016    Physical Findings: AIMS: Facial and Oral Movements Muscles of Facial Expression: None, normal Lips and Perioral Area: None, normal Jaw: None, normal Tongue: None, normal,Extremity Movements Upper (arms, wrists, hands, fingers): None, normal Lower (legs, knees, ankles, toes): None, normal, Trunk Movements Neck, shoulders, hips: None, normal, Overall Severity Severity of abnormal movements (highest score from questions above): None, normal Incapacitation due to abnormal movements: None, normal Patient's awareness of abnormal movements (rate only patient's report): No Awareness, Dental Status Current problems with teeth and/or dentures?: No Does patient usually wear dentures?: No  CIWA:  CIWA-Ar Total: 2 COWS:  COWS Total Score: 3  Musculoskeletal: Strength & Muscle Tone: within normal limits Gait & Station: normal Patient leans: N/A  Psychiatric Specialty Exam: Physical Exam  Nursing note and vitals reviewed.   Review of Systems  All other systems reviewed and are negative.   Blood pressure (!) 112/59, pulse 71, temperature 98.3 F (36.8 C), temperature source Oral, resp. rate 18, height 5\' 6"   (1.676 m), weight 95.3 kg, last menstrual period  01/02/2018, SpO2 99 %.Body mass index is 33.89 kg/m.  General Appearance: Casual  Eye Contact:  Good  Speech:  Clear and Coherent  Volume:  Normal  Mood:  Depressed  Affect:  Much brighter, smiling  Thought Process:  Coherent and Goal Directed  Orientation:  Full (Time, Place, and Person)  Thought Content:  Logical  Suicidal Thoughts:  Yes.  without intent/plan  Homicidal Thoughts:  No  Memory:  Immediate;   Fair  Judgement:  Impaired  Insight:  lacking  Psychomotor Activity:  Normal  Concentration:  Concentration: Fair  Recall:  [employer] of Knowledge:  Fair  Language:  Fair  Akathisia:  No      Assets:  Resilience  ADL's:  Intact  Cognition:  WNL  Sleep:  Number of Hours: 7     Treatment Plan Summary: 27 yo female admitted due to [place] and [place]. Affect is brighter today. She is frequently seen laughing and joking with peers. Self harm thoughts are much less intrusive today.   Plan:  Schizoaffective disorder by history -Continue Clozapine 175 mg qhs -Continue Lithium 300 mg BID -Continue Prazosin 2 mg qhs -Continue Effexor XR 150 mg daily -Continue Trileptal 300 mg BID  Hypothyroid -Continue Synthroid 100 mcg  Dispo -She will return to group home when stable. Her mother is her guardian  Marylin Crosby, MD 01/09/2018, 2:33 PM

## 2018-01-10 NOTE — Plan of Care (Signed)
Patient denies having thoughts wanting to harm herself, affect is flat but she brightens upon approach, she is  pleasant and cooperative this evening with all treatment plans. Patient reports that she slept good last night sleeping 6.5 hours. Patient's  thoughts are organized and coherent, she appears less anxious and she is interacting with peers and staff appropriately. Patient was offered support and encouraged to attend therapeutic groups. Patient  is complaint with medication regimen. Patient is receptive to treatment and safety maintained on unit with q 15 minute safety checks.

## 2018-01-10 NOTE — Progress Notes (Signed)
[facility] MD Progress Note  01/10/2018 2:41 PM Melanie Bell  MRN:  [id number] Subjective:  Pt states that she is doing better. Mood is improving. She is working on her crisis plan and will have this completed tomorrow so we can review it tomorrow. She denies SI or any thoughts of self harm today. She states that she does not think that she will harm herself when she goes home but wants to have her crisis plan incase those thoughts arise. She is social with peers and spending time outside this afternoon. She is more awake during the day and feels good. Less fatigued. She asks if she has an ob/gyn appointment. Discussed that she has one on Wednesday which she is glad to hear because she wants to follow up as she has had abnormal pap smears int he past. She is sleeping well and tolerating medications well.   Principal Problem: Schizoaffective disorder, bipolar type ([place]) Diagnosis:   Patient Active Problem List   Diagnosis Date Noted  . Borderline personality disorder ([place]) [F60.3] 01/04/2018    Priority: High  . PTSD (post-traumatic stress disorder) [F43.10] 01/03/2018    Priority: High  . Schizoaffective disorder, bipolar type ([place]) [F25.0] 11/06/2014    Priority: High  . Dysfunctional uterine bleeding [N93.8] 01/06/2018  . Suicidal ideation [R45.851] 01/02/2018  . Self-inflicted injury [B09.62] 11/23/2017  . ASCUS of cervix with negative high risk HPV [R87.610] 08/28/2017  . Malingering [Z76.5] 05/06/2016  . Foreign body aspiration [T17.900A]   . Other foreign object in esophagus causing other injury, subsequent encounter [T18.198D]   . Swallowed foreign body [T18.9XXA] 04/23/2016  . Foreign body ingestion [T18.9XXA] 04/21/2016  . Gastric foreign body [T18.2XXA]   . 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 Tobacco use disorder [F17.200] 09/30/2014  . Hypothyroidism [E03.9] 09/29/2014  . Hypertension [I10] 09/29/2014   Total Time spent with patient: 20 minutes  Past Psychiatric History:  See H&P  Past Medical History:  Past Medical History:  Diagnosis Date  . Anxiety   . Asthma   . Depression   . GERD (gastroesophageal reflux disease)   . Hallucinations 09/30/2014   Sizoaffective  . Hyperlipidemia   . Hypertension   . Intentional ingestion of batteries 04/21/2016    2 AAA batteries and 1 thumb tack; intent to hurt herself/notes 04/21/2016  . Tardive dyskinesia 10/2014   recent onset    Past Surgical History:  Procedure Laterality Date  . ABDOMINAL SURGERY     "years ago" to remove foreign objects  . APPENDECTOMY    . BREAST EXCISIONAL BIOPSY Right 09/03/2007   surgical bx removed fibroadeoma  . BREAST LUMPECTOMY Right   . COLONOSCOPY WITH PROPOFOL N/A 09/10/2015   Procedure: COLONOSCOPY WITH PROPOFOL;  Surgeon: [name], MD;  Location: [facility] ENDOSCOPY;  Service: Endoscopy;  Laterality: N/A;  . ESOPHAGOGASTRODUODENOSCOPY N/A 11/28/2014   Procedure: ESOPHAGOGASTRODUODENOSCOPY (EGD);  Surgeon: [name], MD;  Location: [facility] ENDOSCOPY;  Service: Endoscopy;  Laterality: N/A;  . ESOPHAGOGASTRODUODENOSCOPY N/A 02/21/2016   Procedure: ESOPHAGOGASTRODUODENOSCOPY (EGD);  Surgeon: Mauri Pole, MD;  Location: [facility] ENDOSCOPY;  Service: Endoscopy;  Laterality: N/A;  . ESOPHAGOGASTRODUODENOSCOPY N/A 04/21/2016   Procedure: ESOPHAGOGASTRODUODENOSCOPY (EGD);  Surgeon: Gatha Mayer, MD;  Location: [facility] ENDOSCOPY;  Service: Endoscopy;  Laterality: N/A;  . ESOPHAGOGASTRODUODENOSCOPY N/A 05/06/2016   Procedure: ESOPHAGOGASTRODUODENOSCOPY (EGD);  Surgeon: Jonathon Bellows, MD;  Location[facility]COPY;  Service: Gastroenterology;  Laterality: N/A;  . ESOPHAGOGASTRODUODENOSCOPY N/A 06/13/2016   Procedure: ESOPHAGOGASTRODUODENOSCOPY (EGD);  Surgeon: [name], MD;  Location: [facility] ENDOSCOPY;  Service: Endoscopy;  Laterality: N/A;  . ESOPHAGOGASTRODUODENOSCOPY (EGD) WITH PROPOFOL N/A 02/29/2016   Procedure: ESOPHAGOGASTRODUODENOSCOPY (EGD) WITH PROPOFOL;  Surgeon: [name],  MD;  Location: [facility] ENDOSCOPY;  Service: Endoscopy;  Laterality: N/A;  . LAPAROTOMY N/A 09/12/2015   Procedure: EXPLORATORY LAPAROTOMY;  Surgeon: Florene Glen, MD;  Location: [facility] ORS;  Service: General;  Laterality: N/A;  . SIGMOIDOSCOPY N/A 09/12/2015   Procedure: Lonell Face;  Surgeon: Florene Glen, MD;  Location: [facility] ORS;  Service: General;  Laterality: N/A;  . WISDOM TOOTH EXTRACTION     Family History:  Family History  Problem Relation Age of Onset  . Depression Mother   . Hypertension Mother   . Sleep apnea Mother   . Asthma Mother   . COPD Mother   . Diabetes Mother   . Breast cancer Maternal Aunt        20's   Family Psychiatric  History: See H&P Social History:  Social History   Substance and Sexual Activity  Alcohol Use No     Social History   Substance and Sexual Activity  Drug Use No    Social History   Socioeconomic History  . Marital status: Single    Spouse name: Not on file  . Number of children: Not on file  . Years of education: Not on file  . Highest education level: Not on file  Occupational History  . Not on file  Social Needs  . Financial resource strain: Not on file  . Food insecurity:    Worry: Not on file    Inability: Not on file  . Transportation needs:    Medical: Not on file    Non-medical: Not on file  Tobacco Use  . Smoking status: Former Smoker    Packs/day: 0.50    Years: 3.00    Pack years: 1.50    Types: Cigarettes    Last attempt to quit: 08/2016    Years since quitting: 1.4  . Smokeless tobacco: Never Used  Substance and Sexual Activity  . Alcohol use: No  . Drug use: No  . Sexual activity: Never    Birth control/protection: Injection  Lifestyle  . Physical activity:    Days per week: Not on file    Minutes per session: Not on file  . Stress: Not on file  Relationships  . Social connections:    Talks on phone: Not on file    Gets together: Not on file    Attends religious service: Not on file     Active member of club or organization: Not on file    Attends meetings of clubs or organizations: Not on file    Relationship status: Not on file  Other Topics Concern  . Not on file  Social History Narrative  . Not on file   Additional Social History:                         Sleep: Good  Appetite:  Good  Current Medications: Current Facility-Administered Medications  Medication Dose Route Frequency Provider Last Rate Last Dose  . acetaminophen (TYLENOL) tablet 650 mg  650 mg Oral Q6H PRN [name], [name], MD   650 mg at 01/07/18 0753  . alum & mag hydroxide-simeth (MAALOX/MYLANTA) 200-200-20 MG/5ML suspension 30 mL  30 mL Oral Q4H PRN [name], [name], MD      . cloZAPine (CLOZARIL) tablet 175 mg  175 mg Oral QHS Annais Crafts,  Tyson Babinski, MD   175 mg at 01/09/18 2209  . docusate sodium (COLACE) capsule 100 mg  100 mg Oral BID [name], [name], MD   100 mg at 01/10/18 0748  . hydrOXYzine (ATARAX/VISTARIL) tablet 50 mg  50 mg Oral TID PRN [name], [name], MD   50 mg at 01/09/18 2207  . levothyroxine (SYNTHROID, LEVOTHROID) tablet 100 mcg  100 mcg Oral QAC breakfast [name], [name], MD   100 mcg at 01/10/18 0749  . lithium carbonate capsule 300 mg  300 mg Oral BID WC [name], [name], MD   300 mg at 01/10/18 0749  . loratadine (CLARITIN) tablet 10 mg  10 mg Oral Daily [name], [name], MD   10 mg at 01/10/18 0749  . magnesium hydroxide (MILK OF MAGNESIA) suspension 30 mL  30 mL Oral Daily PRN [name], [name], MD   30 mL at 01/08/18 1717  . Oxcarbazepine (TRILEPTAL) tablet 300 mg  300 mg Oral BID [name], [name], MD   300 mg at 01/10/18 0749  . pantoprazole (PROTONIX) EC tablet 40 mg  40 mg Oral Daily [name], [name], MD   40 mg at 01/10/18 0749  . prazosin (MINIPRESS) capsule 2 mg  2 mg Oral QHS Korey Prashad, Tyson Babinski, MD   2 mg at 01/09/18 2207  . traZODone (DESYREL) tablet 50 mg  50 mg Oral QHS [name], [name], MD   50 mg at 01/09/18 2207  . venlafaxine XR (EFFEXOR-XR) 24 hr capsule 150 mg  150 mg  Oral Q breakfast [name], [name], MD   150 mg at 01/10/18 8119    Lab Results: No results found for this or any previous visit (from the past 48 hour(s)).  Blood Alcohol level:  Lab Results  Component Value Date   ETH <10 01/02/2018   ETH <10 14/78/2956    Metabolic Disorder Labs: Lab Results  Component Value Date   HGBA1C 5.1 11/23/2017   MPG 99.67 11/23/2017   MPG 103 03/03/2016   Lab Results  Component Value Date   PROLACTIN 64.6 (H) 03/04/2016   PROLACTIN 15.8 06/16/2015   Lab Results  Component Value Date   CHOL 173 11/23/2017   TRIG 77 11/23/2017   HDL 41 11/23/2017   CHOLHDL 4.2 11/23/2017   VLDL 15 11/23/2017   LDLCALC 117 (H) 11/23/2017   LDLCALC 127 (H) 03/03/2016    Physical Findings: AIMS: Facial and Oral Movements Muscles of Facial Expression: None, normal Lips and Perioral Area: None, normal Jaw: None, normal Tongue: None, normal,Extremity Movements Upper (arms, wrists, hands, fingers): None, normal Lower (legs, knees, ankles, toes): None, normal, Trunk Movements Neck, shoulders, hips: None, normal, Overall Severity Severity of abnormal movements (highest score from questions above): None, normal Incapacitation due to abnormal movements: None, normal Patient's awareness of abnormal movements (rate only patient's report): No Awareness, Dental Status Current problems with teeth and/or dentures?: No Does patient usually wear dentures?: No  CIWA:  CIWA-Ar Total: 2 COWS:  COWS Total Score: 3  Musculoskeletal: Strength & Muscle Tone: within normal limits Gait & Station: normal Patient leans: N/A  Psychiatric Specialty Exam: Physical Exam  Nursing note and vitals reviewed.   Review of Systems  All other systems reviewed and are negative.   Blood pressure 116/67, pulse 95, temperature 98.1 F (36.7 C), temperature source Oral, resp. rate 16, height 5\' 6"  (1.676 m), weight 95.3 kg, last menstrual period 01/02/2018, SpO2 100 %.Body mass index is  33.89 kg/m.  General Appearance:  Casual  Eye Contact:  Fair  Speech:  Clear and Coherent  Volume:  Normal  Mood:  Euthymic  Affect:  Constricted but much brighter compared to admission  Thought Process:  Coherent and Goal Directed  Orientation:  Full (Time, Place, and Person)  Thought Content:  Logical  Suicidal Thoughts:  No  Homicidal Thoughts:  No  Memory:  NA  Judgement:  Impaired  Insight:  Lacking  Psychomotor Activity:  Normal  Concentration:  Concentration: Fair  Recall:  [employer] of Knowledge:  Fair  Language:  Fair  Akathisia:  No      Assets:  Resilience  ADL's:  Intact  Cognition:  WNL  Sleep:  Number of Hours: 6.5     Treatment Plan Summary: 28 yo female admitted due to SI and AH. Mood is greatly improving and SI is resolving. She is bright and social on the unit. She is working on her crisis and safety plan for discharge. Plan to likely discharge on Friday.  Plan:  Schizoaffective disorder by history -Continue Clozapine 175 mg qhs -Continue lithium 300 mg BID -Continue Prazosin 2 mg qhs -Continue Effexor XR 150 mg daily -Continue Trileptal 300 mg BID  Hypothyroid -Synthroid 100 mcg  Abnormal uterine bleeding -She will follow up with outpatient ob/gyn next week  Dispo -She will discharge to group home. Likely discharge on Friday   Marylin Crosby, MD 01/10/2018, 2:41 PM

## 2018-01-10 NOTE — Progress Notes (Signed)
D: Patient denies SI/HI/AVH, affect is flat but she brightens upon approach, she is  pleasant and cooperative this evening with all treatment plans. Patient's  thoughts are organized and coherent, she appears less anxious and she is interacting with peers and staff appropriately.  A: Patient was offered support and encouraged to attend evening wrap up group,and was also given scheduled medications. 15 minute checks were maintained for safety.  R:Patient attends groups and interacts appropriately with peers and staff. Patient  is complaint with medication regimen. Patient is receptive to treatment and safety maintained on unit.

## 2018-01-10 NOTE — Progress Notes (Signed)
Recreation Therapy Notes  Date: 01/11/2018  Time: 9:30 am   Location: [facility]   Behavioral response: N/A   Intervention Topic: Emotions  Discussion/Intervention: Patient did not attend group.   Clinical Observations/Feedback:  Patient did not attend group.   Melanie Bell LRT/CTRS        [name] 01/10/2018 10:19 AM

## 2018-01-10 NOTE — Progress Notes (Signed)
   01/10/18 1430  Clinical Encounter Type  Visited With Patient  Visit Type Follow-up;Spiritual support;Behavioral Health  Referral From Social work  Consult/Referral To Chaplain  Spiritual Encounters  Spiritual Needs Emotional;Other (Comment)   [place] visited with Melanie Bell while she was outside in group. I asked her if she remembered me? She stated yes. I also asked how here new placement was going? The patient stated that she liked the new placement. I encouraged her to enjoy this new place and hopefully she will be back there soon.

## 2018-01-10 NOTE — Progress Notes (Signed)
Patient ID: Melanie Bell, female   DOB: 03-11-1991, 27 y.o.   MRN: [id number] PER STATE REGULATIONS 482.30  THIS CHART WAS REVIEWED FOR MEDICAL NECESSITY WITH RESPECT TO THE PATIENT'S ADMISSION/ DURATION OF STAY.  NEXT REVIEW DATE: 01/14/2018  Chauncy Lean, RN, BSN CASE MANAGER

## 2018-01-11 NOTE — Progress Notes (Signed)
Melanie Bell [facility] MD Progress Note  01/11/2018 12:38 PM Melanie Bell  MRN:  [id number] Subjective:  Pt states that she is feeling better. Mood is improved and much less depressed. She denies SI or any thoughts of self harm. She has been keeping busy and using her coping skills which have been helpful. She also states that AH have completely resolved. She completed her crisis plan and wants to review it tomorrow prior to discharge so it is fresh in her mind. She feels she can keep herself safe by following her plan and by keeping busy.   Principal Problem: Schizoaffective disorder, bipolar type ([place]) Diagnosis:   Patient Active Problem List   Diagnosis Date Noted  . Borderline personality disorder ([place]) [F60.3] 01/04/2018    Priority: High  . PTSD (post-traumatic stress disorder) [F43.10] 01/03/2018    Priority: High  . Schizoaffective disorder, bipolar type ([place]) [F25.0] 11/06/2014    Priority: High  . Dysfunctional uterine bleeding [N93.8] 01/06/2018  . Suicidal ideation [R45.851] 01/02/2018  . Self-inflicted injury [X54.00] 11/23/2017  . ASCUS of cervix with negative high risk HPV [R87.610] 08/28/2017  . Malingering [Z76.5] 05/06/2016  . Foreign body aspiration [T17.900A]   . Other foreign object in esophagus causing other injury, subsequent encounter [T18.198D]   . Swallowed foreign body [T18.9XXA] 04/23/2016  . Foreign body ingestion [T18.9XXA] 04/21/2016  . Gastric foreign body [T18.2XXA]   . 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 Tobacco use disorder [F17.200] 09/30/2014  . Hypothyroidism [E03.9] 09/29/2014  . Hypertension [I10] 09/29/2014   Total Time spent with patient: 20 minutes  Past Psychiatric History: See H&P  Past Medical History:  Past Medical History:  Diagnosis Date  . Anxiety   . Asthma   . Depression   . GERD (gastroesophageal reflux disease)   . Hallucinations 09/30/2014   Sizoaffective  . Hyperlipidemia   . Hypertension   . Intentional ingestion of batteries  04/21/2016    2 AAA batteries and 1 thumb tack; intent to hurt herself/notes 04/21/2016  . Tardive dyskinesia 10/2014   recent onset    Past Surgical History:  Procedure Laterality Date  . ABDOMINAL SURGERY     "years ago" to remove foreign objects  . APPENDECTOMY    . BREAST EXCISIONAL BIOPSY Right 09/03/2007   surgical bx removed fibroadeoma  . BREAST LUMPECTOMY Right   . COLONOSCOPY WITH PROPOFOL N/A 09/10/2015   Procedure: COLONOSCOPY WITH PROPOFOL;  Surgeon: [name], MD;  Location: [facility] ENDOSCOPY;  Service: Endoscopy;  Laterality: N/A;  . ESOPHAGOGASTRODUODENOSCOPY N/A 11/28/2014   Procedure: ESOPHAGOGASTRODUODENOSCOPY (EGD);  Surgeon: [name], MD;  Location[facility] END[facility]COPY;  Service: Endoscopy;  Laterality: N/A;  . ESOPHAGOGASTRODUODENOSCOPY N/A 02/21/2016   Procedure: ESOPHAGOGASTRODUODENOSCOPY (EGD);  Surgeon: Mauri Pole, MD;  Location: [facility] ENDOSCOPY;  Service: Endoscopy;  Laterality: N/A;  . ESOPHAGOGASTRODUODENOSCOPY N/A 04/21/2016   Procedure: ESOPHAGOGASTRODUODENOSCOPY (EGD);  Surgeon: Gatha Mayer, MD;  Location: [facility] ENDOSCOPY;  Service: Endoscopy;  Laterality: N/A;  . ESOPHAGOGASTRODUODENOSCOPY N/A 05/06/2016   Procedure: ESOPHAGOGASTRODUODENOSCOPY (EGD);  Surgeon: Jonathon Bellows, MD;  Location: [facility] ENDOSCOPY;  Service: Gastroenterology;  Laterality: N/A;  . ESOPHAGOGASTRODUODENOSCOPY N/A 06/13/2016   Procedure: ESOPHAGOGASTRODUODENOSCOPY (EGD);  Surgeon: [name], MD;  Location: [facility] ENDOSCOPY;  Service: Endoscopy;  Laterality: N/A;  . ESOPHAGOGASTRODUODENOSCOPY (EGD) WITH PROPOFOL N/A 02/29/2016   Procedure: ESOPHAGOGASTRODUODENOSCOPY (EGD) WITH PROPOFOL;  Surgeon: [name], MD;  Location: [facility] ENDOSCOPY;  Service: Endoscopy;  Laterality: N/A;  . LAPAROTOMY N/A 09/12/2015   Procedure: EXPLORATORY LAPAROTOMY;  Surgeon: Delfino Lovett  Melchor Amour, MD;  Location: [facility] ORS;  Service: General;  Laterality: N/A;  . SIGMOIDOSCOPY N/A 09/12/2015   Procedure:  Lonell Face;  Surgeon: Florene Glen, MD;  Location: [facility] ORS;  Service: General;  Laterality: N/A;  . WISDOM TOOTH EXTRACTION     Family History:  Family History  Problem Relation Age of Onset  . Depression Mother   . Hypertension Mother   . Sleep apnea Mother   . Asthma Mother   . COPD Mother   . Diabetes Mother   . Breast cancer Maternal Aunt        20's   Family Psychiatric  History: See H&P Social History:  Social History   Substance and Sexual Activity  Alcohol Use No     Social History   Substance and Sexual Activity  Drug Use No    Social History   Socioeconomic History  . Marital status: Single    Spouse name: Not on file  . Number of children: Not on file  . Years of education: Not on file  . Highest education level: Not on file  Occupational History  . Not on file  Social Needs  . Financial resource strain: Not on file  . Food insecurity:    Worry: Not on file    Inability: Not on file  . Transportation needs:    Medical: Not on file    Non-medical: Not on file  Tobacco Use  . Smoking status: Former Smoker    Packs/day: 0.50    Years: 3.00    Pack years: 1.50    Types: Cigarettes    Last attempt to quit: 08/2016    Years since quitting: 1.4  . Smokeless tobacco: Never Used  Substance and Sexual Activity  . Alcohol use: No  . Drug use: No  . Sexual activity: Never    Birth control/protection: Injection  Lifestyle  . Physical activity:    Days per week: Not on file    Minutes per session: Not on file  . Stress: Not on file  Relationships  . Social connections:    Talks on phone: Not on file    Gets together: Not on file    Attends religious service: Not on file    Active member of club or organization: Not on file    Attends meetings of clubs or organizations: Not on file    Relationship status: Not on file  Other Topics Concern  . Not on file  Social History Narrative  . Not on file   Additional Social History:                          Sleep: Good  Appetite:  Good  Current Medications: Current Facility-Administered Medications  Medication Dose Route Frequency Provider Last Rate Last Dose  . acetaminophen (TYLENOL) tablet 650 mg  650 mg Oral Q6H PRN [name], [name], MD   650 mg at 01/07/18 0753  . alum & mag hydroxide-simeth (MAALOX/MYLANTA) 200-200-20 MG/5ML suspension 30 mL  30 mL Oral Q4H PRN [name], [name], MD      . cloZAPine (CLOZARIL) tablet 175 mg  175 mg Oral QHS Marylin Crosby, MD   175 mg at 01/10/18 2145  . docusate sodium (COLACE) capsule 100 mg  100 mg Oral BID [name], [name], MD   100 mg at 01/11/18 0816  . hydrOXYzine (ATARAX/VISTARIL) tablet 50 mg  50 mg Oral TID PRN [name], [name], MD  50 mg at 01/09/18 2207  . levothyroxine (SYNTHROID, LEVOTHROID) tablet 100 mcg  100 mcg Oral QAC breakfast [name], [name], MD   100 mcg at 01/11/18 [phone]  . lithium carbonate capsule 300 mg  300 mg Oral BID WC [name], [name], MD   300 mg at 01/11/18 0817  . loratadine (CLARITIN) tablet 10 mg  10 mg Oral Daily [name], [name], MD   10 mg at 01/11/18 0816  . magnesium hydroxide (MILK OF MAGNESIA) suspension 30 mL  30 mL Oral Daily PRN [name], [name], MD   30 mL at 01/08/18 1717  . Oxcarbazepine (TRILEPTAL) tablet 300 mg  300 mg Oral BID [name], [name], MD   300 mg at 01/11/18 0816  . pantoprazole (PROTONIX) EC tablet 40 mg  40 mg Oral Daily [name], [name], MD   40 mg at 01/11/18 0816  . prazosin (MINIPRESS) capsule 2 mg  2 mg Oral QHS Russie Gulledge, Tyson Babinski, MD   2 mg at 01/10/18 2146  . traZODone (DESYREL) tablet 50 mg  50 mg Oral QHS [name], [name], MD   50 mg at 01/10/18 2146  . venlafaxine XR (EFFEXOR-XR) 24 hr capsule 150 mg  150 mg Oral Q breakfast [name], [name], MD   150 mg at 01/11/18 7371    Lab Results: No results found for this or any previous visit (from the past 48 hour(s)).  Blood Alcohol level:  Lab Results  Component Value Date   ETH <10 01/02/2018   ETH <10 11/09/9483     Metabolic Disorder Labs: Lab Results  Component Value Date   HGBA1C 5.1 11/23/2017   MPG 99.67 11/23/2017   MPG 103 03/03/2016   Lab Results  Component Value Date   PROLACTIN 64.6 (H) 03/04/2016   PROLACTIN 15.8 06/16/2015   Lab Results  Component Value Date   CHOL 173 11/23/2017   TRIG 77 11/23/2017   HDL 41 11/23/2017   CHOLHDL 4.2 11/23/2017   VLDL 15 11/23/2017   LDLCALC 117 (H) 11/23/2017   LDLCALC 127 (H) 03/03/2016    Physical Findings: AIMS: Facial and Oral Movements Muscles of Facial Expression: None, normal Lips and Perioral Area: None, normal Jaw: None, normal Tongue: None, normal,Extremity Movements Upper (arms, wrists, hands, fingers): None, normal Lower (legs, knees, ankles, toes): None, normal, Trunk Movements Neck, shoulders, hips: None, normal, Overall Severity Severity of abnormal movements (highest score from questions above): None, normal Incapacitation due to abnormal movements: None, normal Patient's awareness of abnormal movements (rate only patient's report): No Awareness, Dental Status Current problems with teeth and/or dentures?: No Does patient usually wear dentures?: No  CIWA:  CIWA-Ar Total: 2 COWS:  COWS Total Score: 3  Musculoskeletal: Strength & Muscle Tone: within normal limits Gait & Station: normal Patient leans: N/A  Psychiatric Specialty Exam: Physical Exam  Nursing note and vitals reviewed.   Review of Systems  All other systems reviewed and are negative.   Blood pressure 97/60, pulse 99, temperature 99 F (37.2 C), temperature source Oral, resp. rate 16, height 5\' 6"  (1.676 m), weight 95.3 kg, last menstrual period 01/02/2018, SpO2 99 %.Body mass index is 33.89 kg/m.  General Appearance: Casual  Eye Contact:  Fair  Speech:  Clear and Coherent  Volume:  Normal  Mood:  Euthymic  Affect:  Congruent  Thought Process:  Coherent and Goal Directed  Orientation:  Full (Time, Place, and Person)  Thought Content:   Logical  Suicidal Thoughts:  No  Homicidal Thoughts:  No  Memory:  Immediate;   Fair  Judgement:  Poor  Insight:  Lacking  Psychomotor Activity:  Normal  Concentration:  Concentration: Fair  Recall:  [employer] of Knowledge:  Fair  Language:  Fair  Akathisia:  No      Assets:  Resilience  ADL's:  Intact  Cognition:  WNL  Sleep:  Number of Hours: 6.3     Treatment Plan Summary: 27 yo female admitted due to SI and [place]. Mood is improved and SI and self harm thoughts have resolved. She is no longer endorsing AH. She has been socializing with other peers and enjoying her stay on the unit. She will discharge back to her group home tomorrow.   Plan:  Schizoaffective disorder -Continue Clozapine 175 mg qhs -Continue Lithium 300 mg BID -Continue Prazosin 2 mg qhs -Continue Effexor XR 150 mg daily -Continue Trileptal 300 mg BID  Hypothyroid =Synthroid 100 mcg  AUB -F/U with OB/gyn next week  Dispo -She will discharge back to group home tomorrow  Marylin Crosby, MD 01/11/2018, 12:38 PM

## 2018-01-11 NOTE — Plan of Care (Signed)
Pt. Denies SI/Hi and verbally is able to contract for safety. Pt. Reports discomfort from reported menstrual cramping. Pt. Pain being managed with PRN pain medications.    Problem: Self-Concept: Goal: Ability to disclose and discuss suicidal ideas will improve Outcome: Progressing   Problem: Safety: Goal: Ability to remain free from injury will improve Outcome: Progressing   Problem: Safety: Goal: Ability to remain free from injury will improve Outcome: Progressing   Problem: Pain Managment: Goal: General experience of comfort will improve Outcome: Not Progressing

## 2018-01-11 NOTE — Progress Notes (Signed)
Recreation Therapy Notes  Date: 01/11/2018  Time: 9:30 am   Location: Craft Room   Behavioral response: N/A   Intervention Topic: Goals  Discussion/Intervention: Patient did not attend group.   Clinical Observations/Feedback:  Patient did not attend group.   Hensley Treat LRT/CTRS        Moani Weipert 01/11/2018 11:06 AM

## 2018-01-11 NOTE — BHH Group Notes (Signed)
[place] Group Notes:  (Nursing/MHT/Case Management/Adjunct)  Date:  01/11/2018  Time:  12:58 AM  Type of Therapy:  Group Therapy  Participation Level:  Active  Participation Quality:  Appropriate  Affect:  Appropriate  Cognitive:  Appropriate  Insight:  Appropriate  Engagement in Group:  Engaged  Modes of Intervention:  Discussion  Summary of Progress/Problems: Alexsys stated she did not go to group on this day. Mercy stated she would have set the goal to crisis plan. Trixy stated she wanted to develop a crisis plan to help her identify triggers that caused her to be admitted. Barbette stated she wants to learn strategies to help her cope with the things that bother her. MHT reviewed rules and expectations of the unit. MHT processed with the patients about goal setting. MHT encouraged patients to work on goals that would prepare them for discharge. MHT addressed concerns about anxiety. MHT offered steps to address anxiety. MHT informed patients they would have to address the issues that cause anxiety. MHT explained how avoiding stressful things that cause anxiety only increases anxiety by avoiding it. MHT reviewed resources available in the community (providers, medication management, psychiatrist). [name] 01/11/2018, 12:58 AM

## 2018-01-11 NOTE — Progress Notes (Signed)
D: Pt denies SI/HI/AVH, verbally is able to contract for safety. Pt is pleasant and cooperative during assessments, engages well. Pt. Reports discomfort from reported menstrual cramping. Pt. Pain being managed with PRN pain medications. Pt. Reports anxiety and depression low this evening. No other notable observations.   A: Q x 15 minute observation checks were completed for safety. Patient was provided with education.  Patient was given/offered medications per orders. Patient  was encourage to attend groups, participate in unit activities and continue with plan of care. Pt. Chart and plans of care reviewed. Pt. Given support and encouragement.   R: Patient is complaint with medication and unit procedures. Pt. Attends snacks.             Precautionary checks every 15 minutes for safety maintained, room free of safety hazards, patient sustains no injury or falls during this shift. Will endorse care to next shift.

## 2018-01-11 NOTE — Plan of Care (Signed)
Data: Patient is appropriate and cooperative to assessment. Patient denies SI/HI/AVH. Patient has completed daily self inventory worksheet. Patient has no complaints and a pain rating of 0/10. Patient reports good sleep quality, appetite is good for the last 24 hours. Patient rates depression "1/10" , feelings of hopelessness "1/10" and anxiety "2/10" Patients goal for today is to "finish crisis planning."  Action:  Q x 15 minute observation checks were completed for safety. Patient was provided with education on medications. Patient was offered support and encouragement. Patient was given scheduled medications. Patient  was encourage to attend groups, participate in unit activities and continue with plan of care.    Response: Patient is compliant with medication, and treatment. Patient attends groups. Patient has no complaints at this time. Patient is receptive to treatment and safety maintained on unit.    Problem: Self-Concept: Goal: Ability to disclose and discuss suicidal ideas will improve Outcome: Progressing Goal: Will verbalize positive feelings about self Outcome: Progressing   Problem: Safety: Goal: Ability to remain free from injury will improve Outcome: Progressing   Problem: Education: Goal: Ability to make informed decisions regarding treatment will improve Outcome: Not Progressing   Problem: Coping: Goal: Coping ability will improve Outcome: Not Progressing   Problem: Self-Concept: Goal: Will verbalize positive feelings about self Outcome: Not Progressing

## 2018-01-11 NOTE — BHH Group Notes (Signed)
01/11/2018 1PM  Type of Therapy/Topic:  Group Therapy:  Balance in Life  Participation Level:  Active  Description of Group:   This group will address the concept of balance and how it feels and looks when one is unbalanced. Patients will be encouraged to process areas in their lives that are out of balance and identify reasons for remaining unbalanced. Facilitators will guide patients in utilizing problem-solving interventions to address and correct the stressor making their life unbalanced. Understanding and applying boundaries will be explored and addressed for obtaining and maintaining a balanced life. Patients will be encouraged to explore ways to assertively make their unbalanced needs known to significant others in their lives, using other group members and facilitator for support and feedback.  Therapeutic Goals: 1. Patient will identify two or more emotions or situations they have that consume much of in their lives. 2. Patient will identify signs/triggers that life has become out of balance:  3. Patient will identify two ways to set boundaries in order to achieve balance in their lives:  4. Patient will demonstrate ability to communicate their needs through discussion and/or role plays  Summary of Patient Progress: Patient attended free expressions group which included recreational play and music therapy. Patient interacted appropriately with other group members. Patient is still in the process of meeting their treatment goals.        Therapeutic Modalities:   Cognitive Behavioral Therapy Solution-Focused Therapy Assertiveness Training  Amaliya Whitelaw, LCSW  

## 2018-01-12 LAB — CBC WITH DIFFERENTIAL/PLATELET
BASOS ABS: 0.1 10*3/uL (ref 0–0.1)
Basophils Relative: 1 %
Eosinophils Absolute: 0.2 10*3/uL (ref 0–0.7)
Eosinophils Relative: 3 %
HEMATOCRIT: 38.4 % (ref 35.0–47.0)
HEMOGLOBIN: 13 g/dL (ref 12.0–16.0)
Lymphocytes Relative: 28 %
Lymphs Abs: 1.7 10*3/uL (ref 1.0–3.6)
MCH: 30.9 pg (ref 26.0–34.0)
MCHC: 34 g/dL (ref 32.0–36.0)
MCV: 91 fL (ref 80.0–100.0)
MONO ABS: 0.3 10*3/uL (ref 0.2–0.9)
Monocytes Relative: 5 %
NEUTROS ABS: 3.9 10*3/uL (ref 1.4–6.5)
Neutrophils Relative %: 63 %
Platelets: 216 10*3/uL (ref 150–440)
RBC: 4.22 MIL/uL (ref 3.80–5.20)
RDW: 13.8 % (ref 11.5–14.5)
WBC: 6.1 10*3/uL (ref 3.6–11.0)

## 2018-01-12 MED ORDER — PRAZOSIN HCL 2 MG PO CAPS: 2.0000 mg | ORAL_CAPSULE | Freq: Every day | ORAL | 0 refills | Status: DC

## 2018-01-12 MED ORDER — CLOZAPINE 25 MG PO TABS: 175.0000 mg | ORAL_TABLET | Freq: Every day | ORAL | 0 refills | Status: DC

## 2018-01-12 NOTE — Progress Notes (Signed)
Recreation Therapy Notes   Date: 01/12/2018  Time: 9:30 pm  Location: Outside  Behavioral response: Appropriate  Group Type: Leisure  Participation level: Active  Communication: Patient was social with peers and staff.  Comments: N/A  Ziah Leandro LRT/CTRS        Janeene Sand 01/12/2018 12:51 PM

## 2018-01-12 NOTE — BHH Group Notes (Signed)
[place] Group Notes:  (Nursing/MHT/Case Management/Adjunct)  Date:  01/12/2018  Time:  2:02 AM  Type of Therapy:  Group Therapy  Participation Level:  Active  Participation Quality:  Appropriate  Affect:  Appropriate  Cognitive:  Alert  Insight:  Good  Engagement in Group:  Engaged  Modes of Intervention:  Support  Summary of Progress/Problems:  Melanie Bell 01/12/2018, 2:02 AM

## 2018-01-12 NOTE — BHH Suicide Risk Assessment (Signed)
[facility] Discharge Suicide Risk Assessment   Principal Problem: Schizoaffective disorder, bipolar type [facility]) Discharge Diagnoses:  Patient Active Problem List   Diagnosis Date Noted  . Borderline personality disorder ([place]) [F60.3] 01/04/2018    Priority: High  . PTSD (post-traumatic stress disorder) [F43.10] 01/03/2018    Priority: High  . Schizoaffective disorder, bipolar type ([place]) [F25.0] 11/06/2014    Priority: High  . Dysfunctional uterine bleeding [N93.8] 01/06/2018  . Suicidal ideation [R45.851] 01/02/2018  . Self-inflicted injury [O96.29] 11/23/2017  . ASCUS of cervix with negative high risk HPV [R87.610] 08/28/2017  . Malingering [Z76.5] 05/06/2016  . Foreign body aspiration [T17.900A]   . Other foreign object in esophagus causing other injury, subsequent encounter [T18.198D]   . Swallowed foreign body [T18.9XXA] 04/23/2016  . Foreign body ingestion [T18.9XXA] 04/21/2016  . Gastric foreign body [T18.2XXA]   . 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 Tobacco use disorder [F17.200] 09/30/2014  . Hypothyroidism [E03.9] 09/29/2014  . Hypertension [I10] 09/29/2014    Mental Status Per Nursing Assessment::   On Admission:  Suicidal ideation indicated by patient, Suicide plan, Self-harm thoughts, Self-harm behaviors  Demographic Factors:  Caucasian and Unemployed  Loss Factors: NA  Historical Factors: Prior suicide attempts and Impulsivity  Risk Reduction Factors:   Living with another person, especially a relative and Positive therapeutic relationship  Continued Clinical Symptoms:  Personality Disorders:   Cluster B More than one psychiatric diagnosis Previous Psychiatric Diagnoses and Treatments  Cognitive Features That Contribute To Risk:  None    Suicide Risk:  Mild Acute Risk:  Suicidal ideation of limited frequency, intensity, duration, and specificity.  Chronic High risk due to long history of self harm and suicide attempts in response to stressful  events  Follow-up Information    Services, Psychotherapeutic. Go on 01/13/2018.   Why:  Your ACTT Team will be following up with you this weekend at your group home. Thank you. Contact information: [address] [place] [address] [phone]        [place]. Go on 01/17/2018.   Why:  Please follow up with Dr. Glennon Mac on Wednesday January 17, 2018 at 10:30am. Thank you. Contact information: [place] [address] [phone]            Marylin Crosby, MD 01/12/2018, 8:21 AM
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
## 2018-01-12 NOTE — [place] Notes (Signed)
  [facility] Adult Case Management Discharge Plan :  Will you be returning to the same living situation after discharge:  Yes,  Back to grou home- [facility] At discharge, do you have transportation home?: Yes,  Group home coming at discharge Do you have the ability to pay for your medications: Yes,  Medicare  Release of information consent forms completed and in the chart;  Patient's signature needed at discharge.  Patient to Follow up at: Follow-up Information    [facility]. Go on 01/13/2018.   Why:  Your ACTT Team will be following up with you this weekend at your group home. Thank you. Contact information: [address] [place] [address] [phone]        [place]. Go on 01/17/2018.   Why:  Please follow up with Dr. Glennon Mac on Wednesday January 17, 2018 at 10:30am. Thank you. Contact information: [address] [place] [address] [phone]           Next level of care provider has access to [place] and Suicide Prevention discussed: Yes,  Completed with patient and patients guardian  Have you used any form of tobacco in the last 30 days? (Cigarettes, Smokeless Tobacco, Cigars, and/or Pipes): Yes  Has patient been referred to the Quitline?: Patient refused referral  Patient has been referred for addiction treatment: N/A  Darin Engels, LCSW 01/12/2018, 8:52 AM

## 2018-01-12 NOTE — NC FL2 (Signed)
[place] LEVEL OF CARE SCREENING TOOL     IDENTIFICATION  Patient [name] Birthdate: October 26, 1990 Sex: female Admission Date (Current Location): 01/02/2018  [place] and [place] Number:  Psychologist, forensic [id number] Facility and Address:  [facility], [address], [place], [place] [address]      Provider Number: [id number]  Attending Physician Name and Address:  Marylin Crosby, MD  Relative Name and Phone Number:  [name] Mother and [place] Face  [phone]    Current Level of Care: Hospital Recommended Level of Care: Other (Comment)([facility]) Prior Approval Number:    Date Approved/Denied:   PASRR Number:    Discharge Plan: Domiciliary (Rest home)    Current Diagnoses: Patient Active Problem List   Diagnosis Date Noted  . Dysfunctional uterine bleeding 01/06/2018  . Borderline personality disorder ([place]) 01/04/2018  . PTSD (post-traumatic stress disorder) 01/03/2018  . Suicidal ideation 01/02/2018  . Self-inflicted injury 80/99/8338  . ASCUS of cervix with negative high risk HPV 08/28/2017  . Malingering 05/06/2016  . Foreign body aspiration   . Other foreign object in esophagus causing other injury, subsequent encounter   . Swallowed foreign body 04/23/2016  . Foreign body ingestion 04/21/2016  . Gastric foreign body   . Breast tumor 03/05/2016  . Schizoaffective disorder, bipolar type ([place]) 11/06/2014  . Tobacco use disorder 09/30/2014  . Hypothyroidism 09/29/2014  . Hypertension 09/29/2014    Orientation RESPIRATION BLADDER Height & Weight     Self, Time, Situation, Place  Normal Continent Weight: 210 lb (95.3 kg) Height:  5\' 6"  (167.6 cm)  BEHAVIORAL SYMPTOMS/MOOD NEUROLOGICAL BOWEL NUTRITION STATUS  Other (Comment)(Hx of swallowing forign objects and self-injurious behavior) (N/A) Continent (N/A)  AMBULATORY STATUS COMMUNICATION OF NEEDS Skin   Independent Verbally  Normal                       Personal Care Assistance Level of Assistance  Bathing, Feeding, Dressing Bathing Assistance: Independent Feeding assistance: Independent Dressing Assistance: Independent     Functional Limitations Info  Sight, Hearing, Speech Sight Info: Adequate Hearing Info: Adequate Speech Info: Adequate    SPECIAL CARE FACTORS FREQUENCY  (N/A)                    Contractures Contractures Info: Not present    Additional Factors Info  Psychotropic     Psychotropic Info: Psycotropic Medications         Current Medications (01/12/2018):  This is the current hospital active medication list Current Facility-Administered Medications  Medication Dose Route Frequency Provider Last Rate Last Dose  . acetaminophen (TYLENOL) tablet 650 mg  650 mg Oral Q6H PRN [name], [name], MD   650 mg at 01/07/18 0753  . alum & mag hydroxide-simeth (MAALOX/MYLANTA) 200-200-20 MG/5ML suspension 30 mL  30 mL Oral Q4H PRN [name], [name], MD      . cloZAPine (CLOZARIL) tablet 175 mg  175 mg Oral QHS Marylin Crosby, MD   175 mg at 01/11/18 2120  . docusate sodium (COLACE) capsule 100 mg  100 mg Oral BID [name], [name], MD   100 mg at 01/12/18 0759  . hydrOXYzine (ATARAX/VISTARIL) tablet 50 mg  50 mg Oral TID PRN [name], [name], MD   50 mg at 01/11/18 2120  . levothyroxine (SYNTHROID, LEVOTHROID) tablet 100 mcg  100 mcg Oral QAC breakfast [name], [name], MD   100 mcg at 01/12/18 0606  .  lithium carbonate capsule 300 mg  300 mg Oral BID WC [name], [name], MD   300 mg at 01/12/18 0800  . loratadine (CLARITIN) tablet 10 mg  10 mg Oral Daily [name], [name], MD   10 mg at 01/12/18 0800  . magnesium hydroxide (MILK OF MAGNESIA) suspension 30 mL  30 mL Oral Daily PRN [name], [name], MD   30 mL at 01/08/18 1717  . Oxcarbazepine (TRILEPTAL) tablet 300 mg  300 mg Oral BID [name], [name], MD   300 mg at 01/12/18 0800  . pantoprazole (PROTONIX) EC tablet 40 mg  40 mg Oral Daily  [name], [name], MD   40 mg at 01/12/18 0800  . prazosin (MINIPRESS) capsule 2 mg  2 mg Oral QHS McNew, Tyson Babinski, MD   2 mg at 01/11/18 2210  . traZODone (DESYREL) tablet 50 mg  50 mg Oral QHS [name], [name], MD   50 mg at 01/11/18 2121  . venlafaxine XR (EFFEXOR-XR) 24 hr capsule 150 mg  150 mg Oral Q breakfast [name], [name], MD   150 mg at 01/12/18 0800     Discharge Medications: Please see discharge summary for a list of discharge medications.  Relevant Imaging Results:  Relevant Lab Results:   Additional Information Allergies: Betadine, Shell fish derived products Iodine  SSN # [id number]  Darin Engels, LCSW

## 2018-01-12 NOTE — Plan of Care (Addendum)
Patient found in day room upon my arrival. Patient is visible and social this evening. Patient is polite and cooperative with assessment. Denies all complaints including SI/HI/AVH. Reports eating and voiding adequately. Attends group. Compliant with HS medications and staff direction. Q 15 minute checks maintained. Will continue to monitor throughout the shift. Patient slept 7.75 hours. No apparent distress. VSS. Compliant with am synthroid. Will endorse care to oncoming shift.  Problem: Education: Goal: Ability to make informed decisions regarding treatment will improve Outcome: Progressing   Problem: Coping: Goal: Coping ability will improve Outcome: Progressing   Problem: Self-Concept: Goal: Ability to disclose and discuss suicidal ideas will improve Outcome: Progressing Goal: Will verbalize positive feelings about self Outcome: Progressing   Problem: Self-Concept: Goal: Will verbalize positive feelings about self Outcome: Progressing   Problem: Education: Goal: Utilization of techniques to improve thought processes will improve Outcome: Progressing Goal: Knowledge of the prescribed therapeutic regimen will improve Outcome: Progressing   Problem: Self-Concept: Goal: Ability to identify factors that promote anxiety will improve Outcome: Progressing Goal: Level of anxiety will decrease Outcome: Progressing   Problem: Nutrition: Goal: Adequate nutrition will be maintained Outcome: Progressing   Problem: Coping: Goal: Level of anxiety will decrease Outcome: Progressing

## 2018-01-12 NOTE — Progress Notes (Signed)
Recreation Therapy Notes  INPATIENT RECREATION TR PLAN  Patient Details [name] MRN: [id number] DOB: 09-10-1990 Today's Date: 01/12/2018  Rec Therapy Plan Is patient appropriate for Therapeutic Recreation?: Yes Treatment times per week: at least 3 Estimated Length of Stay: 5-7 days TR Treatment/Interventions: Group participation (Comment)  Discharge Criteria Pt will be discharged from therapy if:: Discharged Treatment plan/goals/alternatives discussed and agreed upon by:: Patient/family  Discharge Summary Short term goals set: Patient will identify 3 positive coping skills to decrease depressive symptoms within 5 recreation therapy group sessions Short term goals met: Not met, Adequate for discharge Progress toward goals comments: Groups attended Which groups?: Communication, Leisure education Reason goals not met: N/A Therapeutic equipment acquired: N/A Reason patient discharged from therapy: Discharge from hospital Pt/family agrees with progress & goals achieved: Yes Date patient discharged from therapy: 01/12/18   Paul Torpey 01/12/2018, 1:12 PM

## 2018-01-12 NOTE — Progress Notes (Signed)
Patient alert and oriented x 4. Ambulates unit with steady gait. Verbally denies SI/HI/AVH and pain. Patient discharged on above date and time. Verbalized understanding the discharge information provided to patient upon discharge. Patient departed unit with discharge paperwork which included the FL2, prescriptions and personal belongings. Picked up by staff member from [place]. No complaints voiced.

## 2018-01-14 ENCOUNTER — Other Ambulatory Visit: Payer: Self-pay

## 2018-01-14 ENCOUNTER — Encounter (HOSPITAL_COMMUNITY): Payer: Self-pay | Admitting: *Deleted

## 2018-01-14 ENCOUNTER — Emergency Department (HOSPITAL_COMMUNITY): Payer: Medicare Other

## 2018-01-14 ENCOUNTER — Emergency Department (HOSPITAL_COMMUNITY)
Admission: EM | Admit: 2018-01-14 | Discharge: 2018-01-21 | Disposition: A | Discharge status: Other Acute Inpatient Hospital | Payer: Medicare Other | Attending: Internal Medicine | Admitting: Internal Medicine

## 2018-01-14 DIAGNOSIS — T1491XA Suicide attempt, initial encounter: Secondary | ICD-10-CM

## 2018-01-14 DIAGNOSIS — Z888 Allergy status to other drugs, medicaments and biological substances status: Secondary | ICD-10-CM | POA: Diagnosis not present

## 2018-01-14 DIAGNOSIS — F4310 Post-traumatic stress disorder, unspecified: Secondary | ICD-10-CM | POA: Diagnosis not present

## 2018-01-14 DIAGNOSIS — R45851 Suicidal ideations: Secondary | ICD-10-CM | POA: Diagnosis not present

## 2018-01-14 DIAGNOSIS — E785 Hyperlipidemia, unspecified: Secondary | ICD-10-CM | POA: Diagnosis not present

## 2018-01-14 DIAGNOSIS — Z79899 Other long term (current) drug therapy: Secondary | ICD-10-CM | POA: Diagnosis not present

## 2018-01-14 DIAGNOSIS — J45909 Unspecified asthma, uncomplicated: Secondary | ICD-10-CM | POA: Diagnosis not present

## 2018-01-14 DIAGNOSIS — X58XXXA Exposure to other specified factors, initial encounter: Secondary | ICD-10-CM | POA: Insufficient documentation

## 2018-01-14 DIAGNOSIS — E039 Hypothyroidism, unspecified: Secondary | ICD-10-CM | POA: Diagnosis not present

## 2018-01-14 DIAGNOSIS — Z91013 Allergy to seafood: Secondary | ICD-10-CM | POA: Insufficient documentation

## 2018-01-14 DIAGNOSIS — T18198A Other foreign object in esophagus causing other injury, initial encounter: Secondary | ICD-10-CM | POA: Diagnosis present

## 2018-01-14 DIAGNOSIS — I10 Essential (primary) hypertension: Secondary | ICD-10-CM | POA: Diagnosis not present

## 2018-01-14 DIAGNOSIS — K219 Gastro-esophageal reflux disease without esophagitis: Secondary | ICD-10-CM | POA: Diagnosis not present

## 2018-01-14 DIAGNOSIS — Z87891 Personal history of nicotine dependence: Secondary | ICD-10-CM | POA: Diagnosis not present

## 2018-01-14 DIAGNOSIS — F250 Schizoaffective disorder, bipolar type: Secondary | ICD-10-CM | POA: Diagnosis not present

## 2018-01-14 DIAGNOSIS — T189XXA Foreign body of alimentary tract, part unspecified, initial encounter: Secondary | ICD-10-CM | POA: Diagnosis present

## 2018-01-14 DIAGNOSIS — F603 Borderline personality disorder: Secondary | ICD-10-CM | POA: Insufficient documentation

## 2018-01-14 DIAGNOSIS — T182XXA Foreign body in stomach, initial encounter: Secondary | ICD-10-CM | POA: Diagnosis not present

## 2018-01-14 DIAGNOSIS — F419 Anxiety disorder, unspecified: Secondary | ICD-10-CM | POA: Diagnosis not present

## 2018-01-14 LAB — COMPREHENSIVE METABOLIC PANEL
ALT: 7 U/L (ref 0–44)
ANION GAP: 6 (ref 5–15)
AST: 16 U/L (ref 15–41)
Albumin: 3.7 g/dL (ref 3.5–5.0)
Alkaline Phosphatase: 65 U/L (ref 38–126)
BILIRUBIN TOTAL: 0.6 mg/dL (ref 0.3–1.2)
BUN: 10 mg/dL (ref 6–20)
CO2: 25 mmol/L (ref 22–32)
Calcium: 8.8 mg/dL — ABNORMAL LOW (ref 8.9–10.3)
Chloride: 108 mmol/L (ref 98–111)
Creatinine, Ser: 0.62 mg/dL (ref 0.44–1.00)
Glucose, Bld: 88 mg/dL (ref 70–99)
POTASSIUM: 3.9 mmol/L (ref 3.5–5.1)
Sodium: 139 mmol/L (ref 135–145)
TOTAL PROTEIN: 6.4 g/dL — AB (ref 6.5–8.1)

## 2018-01-14 LAB — CBC WITH DIFFERENTIAL/PLATELET
Basophils Absolute: 0 10*3/uL (ref 0.0–0.1)
Basophils Relative: 0 %
EOS PCT: 2 %
Eosinophils Absolute: 0.2 10*3/uL (ref 0.0–0.7)
HEMATOCRIT: 36.4 % (ref 36.0–46.0)
Hemoglobin: 12 g/dL (ref 12.0–15.0)
LYMPHS PCT: 25 %
Lymphs Abs: 2.1 10*3/uL (ref 0.7–4.0)
MCH: 30.3 pg (ref 26.0–34.0)
MCHC: 33 g/dL (ref 30.0–36.0)
MCV: 91.9 fL (ref 78.0–100.0)
MONO ABS: 0.6 10*3/uL (ref 0.1–1.0)
MONOS PCT: 7 %
NEUTROS ABS: 5.5 10*3/uL (ref 1.7–7.7)
Neutrophils Relative %: 66 %
PLATELETS: 225 10*3/uL (ref 150–400)
RBC: 3.96 MIL/uL (ref 3.87–5.11)
RDW: 13.2 % (ref 11.5–15.5)
WBC: 8.4 10*3/uL (ref 4.0–10.5)

## 2018-01-14 LAB — RAPID URINE DRUG SCREEN, HOSP PERFORMED
AMPHETAMINES: NOT DETECTED
BENZODIAZEPINES: NOT DETECTED
Barbiturates: NOT DETECTED
COCAINE: NOT DETECTED
Opiates: NOT DETECTED
Tetrahydrocannabinol: NOT DETECTED

## 2018-01-14 LAB — HCG, SERUM, QUALITATIVE: Preg, Serum: NEGATIVE

## 2018-01-14 LAB — ETHANOL: Alcohol, Ethyl (B): <10 mg/dL (ref <10)

## 2018-01-14 LAB — ACETAMINOPHEN LEVEL

## 2018-01-14 LAB — SALICYLATE LEVEL: Salicylate Lvl: <7 mg/dL (ref 2.8–30.0)

## 2018-01-14 IMAGING — DX DG ABD PORTABLE 2V
2 series · 2 of 2 positions shown · non-contrast
Comparison: None.

CLINICAL DATA: Ingestion of AAA battery, suicidal ideations.

EXAM:
PORTABLE ABDOMEN - 2 VIEW

[abdomen erect]
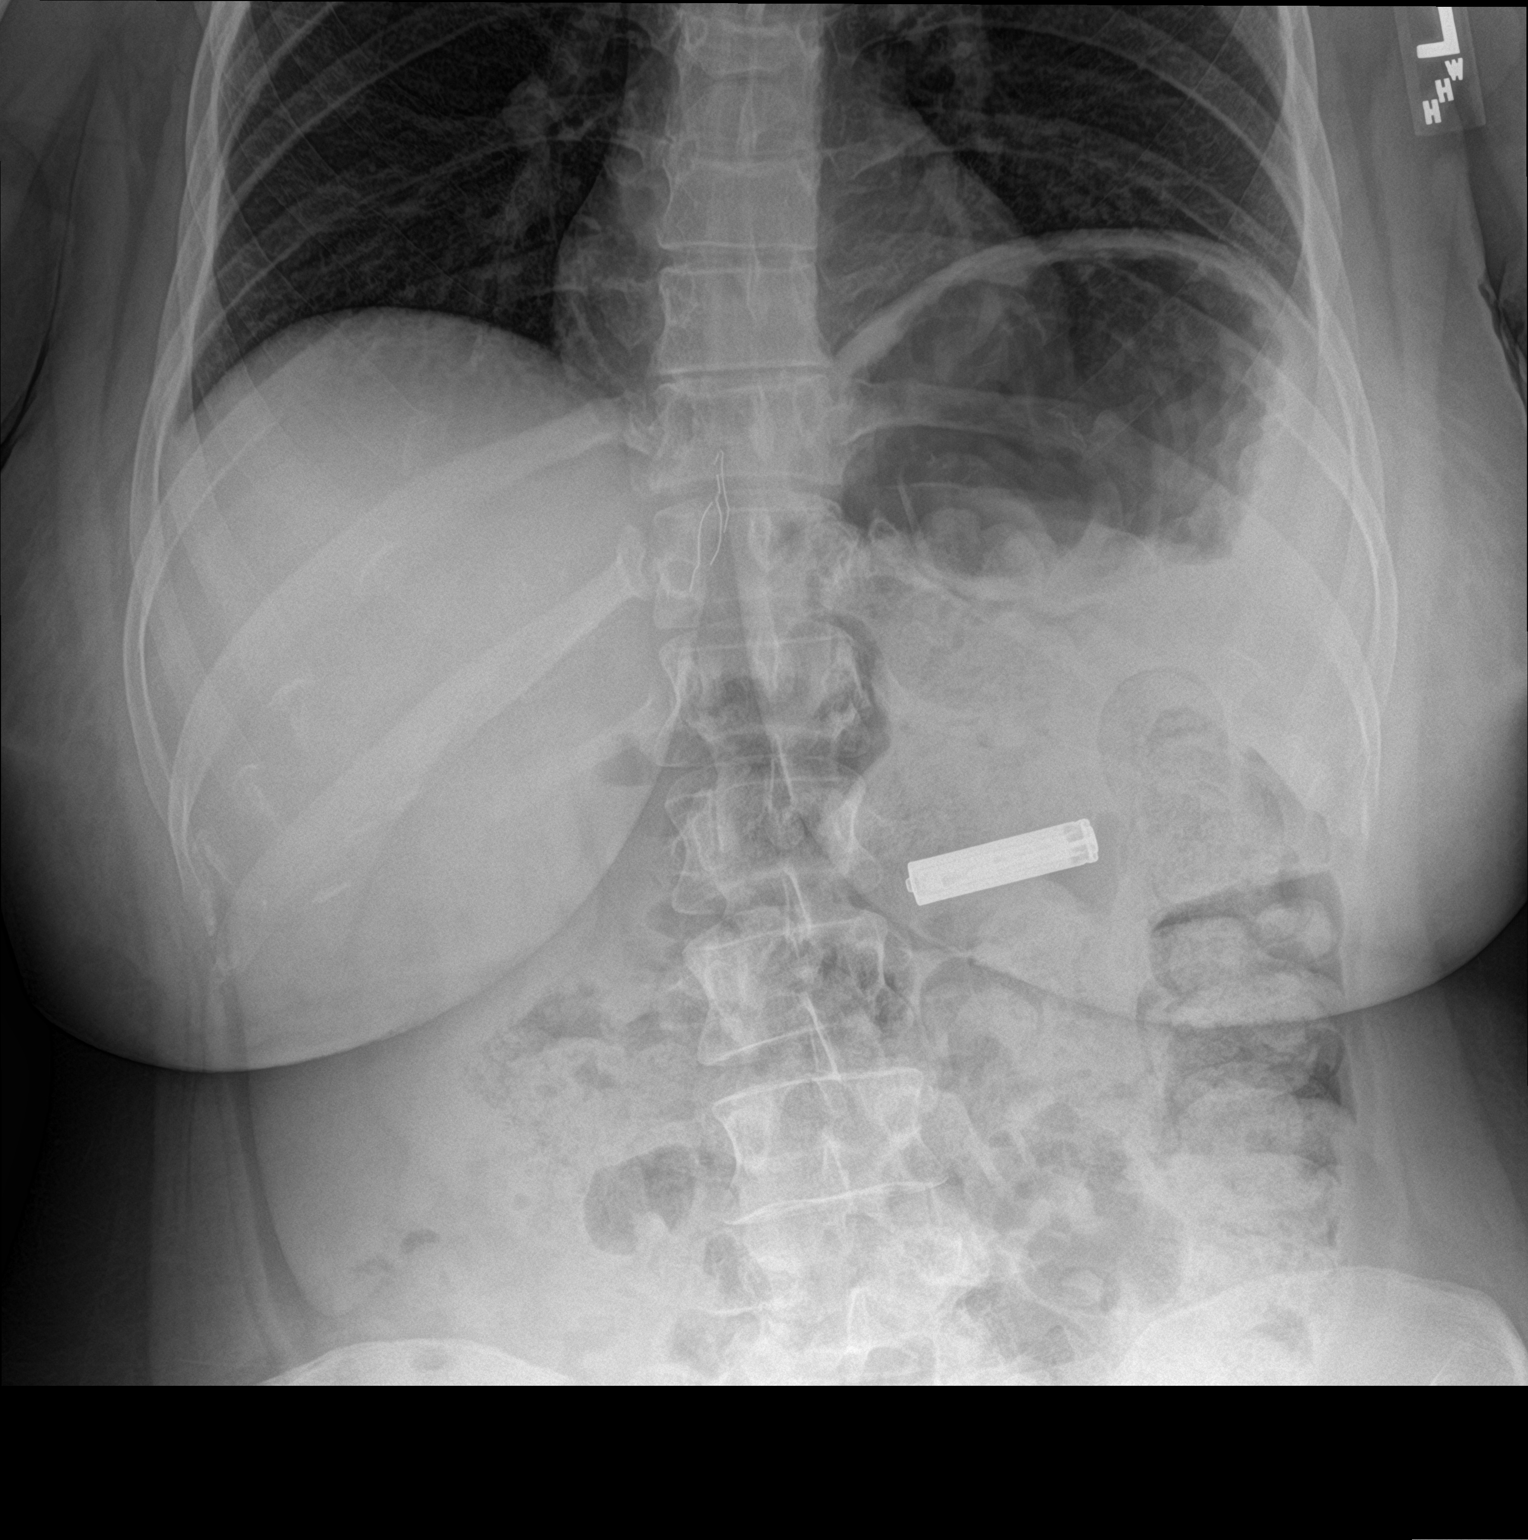

[abdomen supine]
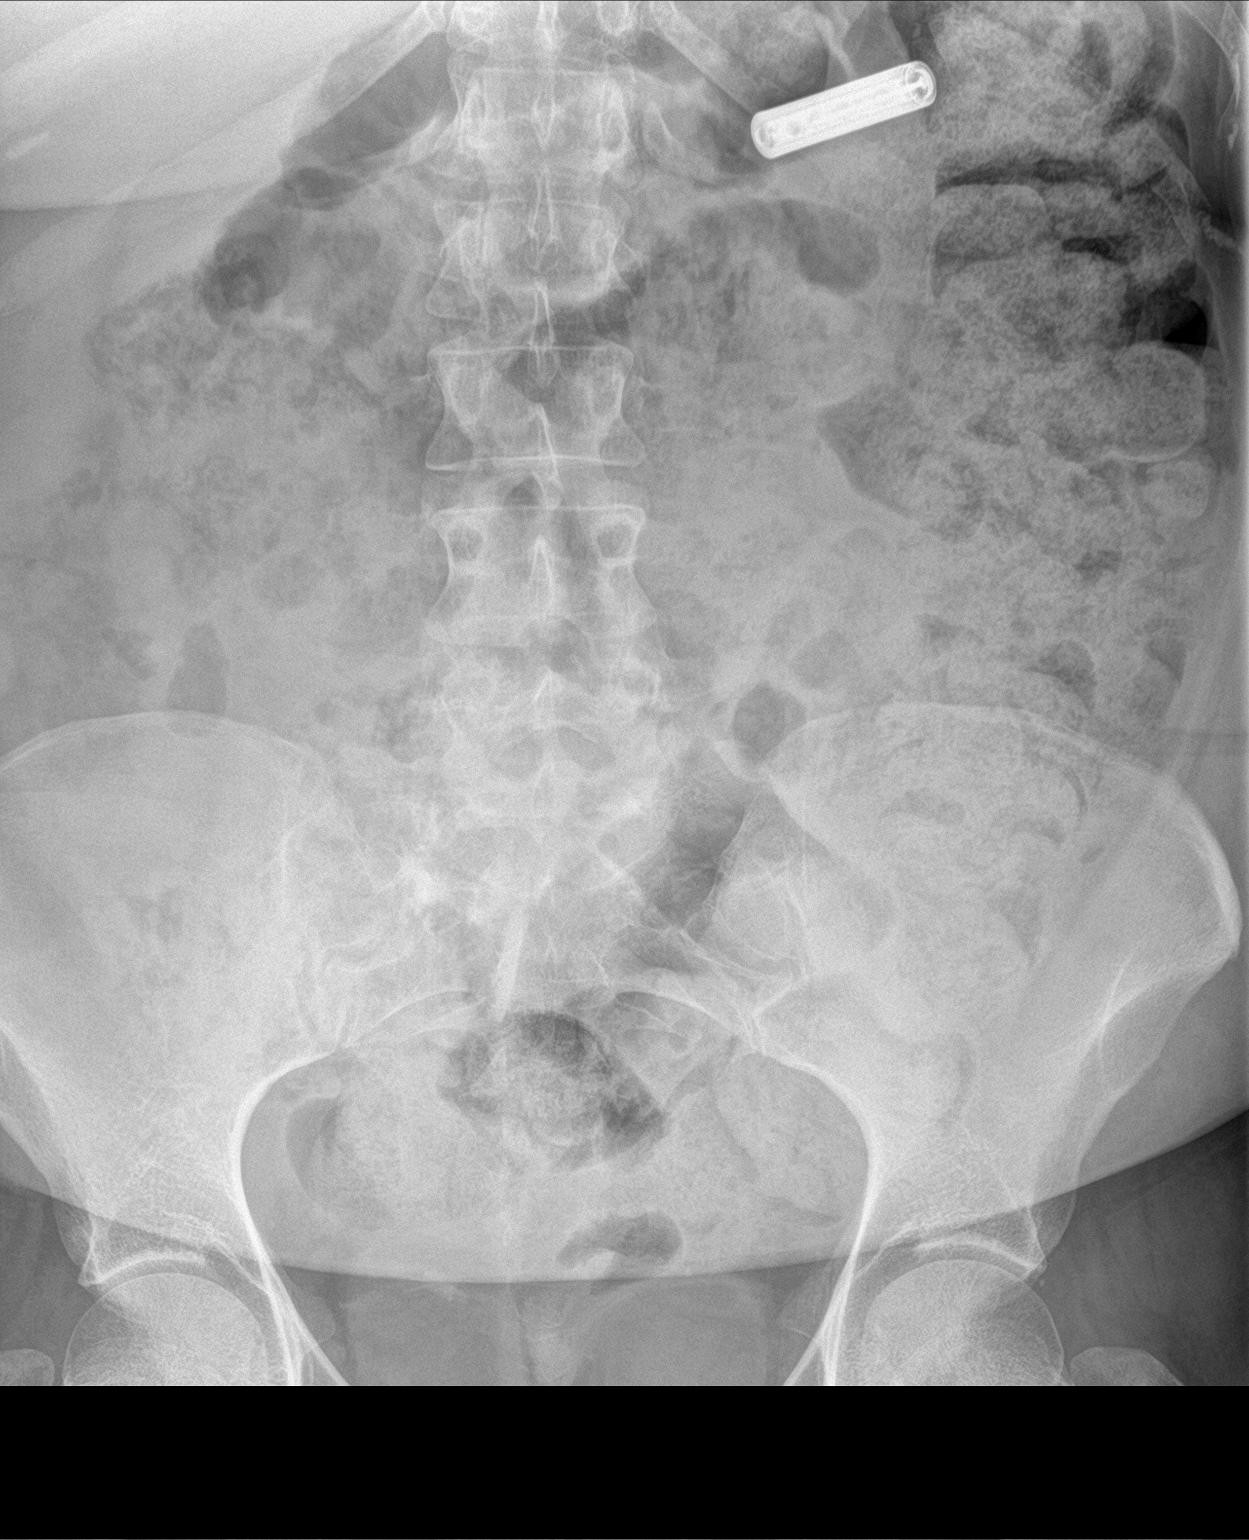

[2 of 2 positions shown; findings below may reference images not displayed]

FINDINGS: A battery shaped foreign body projects over the left upper quadrant
of the abdomen more difficult to determine whether this is in the
stomach, small bowel or transverse colon as all these potential
structures appeared overlap in the left upper quadrant. No free air.
No bowel obstruction. Increased stool retention throughout the
colon.
IMPRESSION: 1. Increased fecal retention throughout the colon consistent with
constipation.
2. Ingested foreign body in the shape of a battery is seen in the
left upper quadrant, difficult to determine on these images whether
it resides in the stomach, proximal small intestine or colon. Favor
upper GI location given that battery is not surrounded by a large
amount of stool as in the remainder of the colon.

## 2018-01-14 NOTE — ED Provider Notes (Signed)
[facility] EMERGENCY DEPARTMENT Provider Note   CSN: [id number] Arrival date & time: 01/14/18  2117     History   Chief Complaint Chief Complaint  Patient presents with  . V70.1    HPI Melanie Bell is a 27 y.o. female.  HPI Patient states she swallowed a AAA battery today at 8 PM after hallucinogenic voices told her to do so.  States that when she follows there commands the voice is quiet momentarily.  She continues to have auditory hallucinations.  She denies any other coingestants.  Denies difficulty swallowing or abdominal pain.  She does admit to nausea. Past Medical History:  Diagnosis Date  . Anxiety   . Asthma   . Depression   . GERD (gastroesophageal reflux disease)   . Hallucinations 09/30/2014   Sizoaffective  . Hyperlipidemia   . Hypertension   . Intentional ingestion of batteries 04/21/2016    2 AAA batteries and 1 thumb tack; intent to hurt herself/notes 04/21/2016  . Tardive dyskinesia 10/2014   recent onset    Patient Active Problem List   Diagnosis Date Noted  . Suicide attempt ([place]) 01/16/2018  . Dysfunctional uterine bleeding 01/06/2018  . Borderline personality disorder ([place]) 01/04/2018  . PTSD (post-traumatic stress disorder) 01/03/2018  . Suicidal ideation 01/02/2018  . Self-inflicted injury 71/21/9758  . ASCUS of cervix with negative high risk HPV 08/28/2017  . Malingering 05/06/2016  . Foreign body aspiration   . Other foreign object in esophagus causing other injury, subsequent encounter   . Swallowed foreign body 04/23/2016  . Foreign body ingestion 04/21/2016  . Gastric foreign body   . Breast tumor 03/05/2016  . Schizoaffective disorder, bipolar type ([place]) 11/06/2014  . Tobacco use disorder 09/30/2014  . Hypothyroidism 09/29/2014  . Hypertension 09/29/2014    Past Surgical History:  Procedure Laterality Date  . ABDOMINAL SURGERY     "years ago" to remove foreign objects  . APPENDECTOMY    . BREAST EXCISIONAL BIOPSY Right  09/03/2007   surgical bx removed fibroadeoma  . BREAST LUMPECTOMY Right   . COLONOSCOPY WITH PROPOFOL N/A 09/10/2015   Procedure: COLONOSCOPY WITH PROPOFOL;  Surgeon: [name], MD;  Location[facility] END[facility]COPY;  Service: Endoscopy;  Laterality: N/A;  . ESOPHAGOGASTRODUODENOSCOPY N/A 11/28/2014   Procedure: ESOPHAGOGASTRODUODENOSCOPY (EGD);  Surgeon: [name], MD;  Location: [facility] ENDOSCOPY;  Service: Endoscopy;  Laterality: N/A;  . ESOPHAGOGASTRODUODENOSCOPY N/A 02/21/2016   Procedure: ESOPHAGOGASTRODUODENOSCOPY (EGD);  Surgeon: Mauri Pole, MD;  Location: [facility] ENDOSCOPY;  Service: Endoscopy;  Laterality: N/A;  . ESOPHAGOGASTRODUODENOSCOPY N/A 04/21/2016   Procedure: ESOPHAGOGASTRODUODENOSCOPY (EGD);  Surgeon: Gatha Mayer, MD;  Location: [facility] ENDOSCOPY;  Service: Endoscopy;  Laterality: N/A;  . ESOPHAGOGASTRODUODENOSCOPY N/A 05/06/2016   Procedure: ESOPHAGOGASTRODUODENOSCOPY (EGD);  Surgeon: Jonathon Bellows, MD;  Location: [facility] ENDOSCOPY;  Service: Gastroenterology;  Laterality: N/A;  . ESOPHAGOGASTRODUODENOSCOPY N/A 06/13/2016   Procedure: ESOPHAGOGASTRODUODENOSCOPY (EGD);  Surgeon: [name], MD;  Location: [facility] ENDOSCOPY;  Service: Endoscopy;  Laterality: N/A;  . ESOPHAGOGASTRODUODENOSCOPY (EGD) WITH PROPOFOL N/A 02/29/2016   Procedure: ESOPHAGOGASTRODUODENOSCOPY (EGD) WITH PROPOFOL;  Surgeon: [name], MD;  Location: [facility] ENDOSCOPY;  Service: Endoscopy;  Laterality: N/A;  . LAPAROTOMY N/A 09/12/2015   Procedure: EXPLORATORY LAPAROTOMY;  Surgeon: Florene Glen, MD;  Location: [facility] ORS;  Service: General;  Laterality: N/A;  . SIGMOIDOSCOPY N/A 09/12/2015   Procedure: Lonell Face;  Surgeon: Florene Glen, MD;  Location: [facility] ORS;  Service: General;  Laterality: N/A;  . WISDOM TOOTH EXTRACTION  OB History    Gravida  0   Para  0   Term  0   Preterm  0   AB  0   Living  0     SAB  0   TAB  0   Ectopic  0   Multiple  0   Live Births                 Home Medications    Prior to Admission medications   Medication Sig Start Date End Date Taking? Authorizing Provider  amLODipine (NORVASC) 2.5 MG tablet Take 2.5 mg by mouth daily.   Yes [provider]  cloZAPine (CLOZARIL) 25 MG tablet Take 7 tablets (175 mg total) by mouth at bedtime. 01/12/18 02/11/18 Yes McNew, Tyson Babinski, MD  docusate sodium (COLACE) 100 MG capsule Take 1 capsule (100 mg total) by mouth daily as needed. Patient taking differently: Take 100 mg by mouth daily as needed for mild constipation or moderate constipation.  12/16/17 12/16/18 Yes Schaevitz, Randall An, MD  hydrOXYzine (ATARAX/VISTARIL) 50 MG tablet Take 1 tablet (50 mg total) by mouth 2 (two) times daily as needed. 12/11/17  Yes [name], [name], MD  levothyroxine (SYNTHROID, LEVOTHROID) 100 MCG tablet Take 1 tablet (100 mcg total) by mouth daily before breakfast. 12/11/17  Yes [name], [name], MD  lithium carbonate 300 MG capsule Take 1 capsule (300 mg total) by mouth 2 (two) times daily with a meal. 12/11/17  Yes [name], [name], MD  medroxyPROGESTERone (DEPO-PROVERA) 150 MG/ML injection Inject 1 mL (150 mg total) into the muscle every 3 (three) months. 12/11/17  Yes [name], [name], MD  omeprazole (PRILOSEC) 20 MG capsule Take 1 capsule (20 mg total) by mouth daily. 12/11/17  Yes [name], [name], MD  OXcarbazepine (TRILEPTAL) 300 MG tablet Take 1 tablet (300 mg total) by mouth 2 (two) times daily. 12/11/17  Yes [name], [name], MD  polyethylene glycol (MIRALAX / GLYCOLAX) packet Take 17 g by mouth daily as needed. 12/11/17  Yes [name], [name], MD  prazosin (MINIPRESS) 2 MG capsule Take 1 capsule (2 mg total) by mouth at bedtime. 01/12/18  Yes McNew, Tyson Babinski, MD  traZODone (DESYREL) 50 MG tablet Take 1 tablet (50 mg total) by mouth at bedtime. 12/11/17  Yes [name], [name], MD  venlafaxine XR (EFFEXOR-XR) 150 MG 24 hr capsule Take 1 capsule (150 mg total) by mouth  daily with breakfast. 12/11/17  Yes [name], [name], MD  Vitamin D, Ergocalciferol, (DRISDOL) 50000 units CAPS capsule Take 1 capsule (50,000 Units total) by mouth every 7 (seven) days. 12/11/17  Yes [name], [name], MD  cetirizine (ZYRTEC) 10 MG tablet Take 1 tablet (10 mg total) by mouth daily as needed for allergies. 12/11/17   [name], Wardell Honour, MD  senna (SENOKOT) 8.6 MG TABS tablet Take 1 tablet (8.6 mg total) by mouth daily as needed for mild constipation. 12/11/17   [name], MD    Family History Family History  Problem Relation Age of Onset  . Depression Mother   . Hypertension Mother   . Sleep apnea Mother   . Asthma Mother   . COPD Mother   . Diabetes Mother   . Breast cancer Maternal Aunt        20's    Social History Social History   Tobacco Use  . Smoking status: Former Smoker    Packs/day: 0.50    Years: 3.00    Pack years:  1.50    Types: Cigarettes    Last attempt to quit: 08/2016    Years since quitting: 1.4  . Smokeless tobacco: Never Used  Substance Use Topics  . Alcohol use: No  . Drug use: No     Allergies   Betadine [povidone iodine]; Iodine; and Shellfish-derived products   Review of Systems Review of Systems  Constitutional: Negative for chills and fever.  HENT: Negative for sore throat and trouble swallowing.   Respiratory: Negative for shortness of breath and stridor.   Cardiovascular: Negative for chest pain and palpitations.  Gastrointestinal: Positive for nausea. Negative for abdominal pain, constipation, diarrhea and vomiting.  Musculoskeletal: Negative for back pain, myalgias and neck pain.  Skin: Negative for rash and wound.  Neurological: Negative for dizziness, weakness, light-headedness, numbness and headaches.  Psychiatric/Behavioral: Positive for hallucinations, self-injury and suicidal ideas.  All other systems reviewed and are negative.    Physical Exam Updated Vital Signs BP 109/70 (BP Location:  Right Arm)   Pulse 76   Temp 98.6 F (37 C) (Oral)   Resp 14   Ht 5\' 6"  (1.676 m)   Wt 99.8 kg   LMP 01/02/2018   SpO2 98%   BMI 35.51 kg/m   Physical Exam  Constitutional: She is oriented to person, place, and time. She appears well-developed and well-nourished. No distress.  HENT:  Head: Normocephalic and atraumatic.  Mouth/Throat: Oropharynx is clear and moist. No oropharyngeal exudate.  Eyes: Pupils are equal, round, and reactive to light. EOM are normal.  Neck: Normal range of motion. Neck supple.  Cardiovascular: Normal rate and regular rhythm.  Pulmonary/Chest: Effort normal and breath sounds normal.  Abdominal: Soft. Bowel sounds are normal. There is no tenderness. There is no rebound and no guarding.  Musculoskeletal: Normal range of motion. She exhibits no edema or tenderness.  Neurological: She is alert and oriented to person, place, and time.  Skin: Skin is warm and dry. No rash noted. She is not diaphoretic. No erythema.  Psychiatric: She has a normal mood and affect. Her behavior is normal.  Calm.  Does not appear to be responding to internal stimuli.  Nursing note and vitals reviewed.    ED Treatments / Results  Labs (all labs ordered are listed, but only abnormal results are displayed) Labs Reviewed  COMPREHENSIVE METABOLIC PANEL - Abnormal; Notable for the following components:      Result Value   Calcium 8.8 (*)    Total Protein 6.4 (*)    All other components within normal limits  ACETAMINOPHEN LEVEL - Abnormal; Notable for the following components:   Acetaminophen (Tylenol), Serum <10 (*)    All other components within normal limits  LITHIUM LEVEL - Abnormal; Notable for the following components:   Lithium Lvl 0.52 (*)    All other components within normal limits  CBC WITH DIFFERENTIAL/PLATELET  RAPID URINE DRUG SCREEN, HOSP PERFORMED  ETHANOL  HCG, SERUM, QUALITATIVE  SALICYLATE LEVEL  POC URINE PREG, ED    EKG None  Radiology No results  found.  Procedures Procedures (including critical care time)  Medications Ordered in ED Medications  amLODipine (NORVASC) tablet 2.5 mg (2.5 mg Oral Given 01/16/18 0914)  loratadine (CLARITIN) tablet 10 mg (10 mg Oral Given 01/16/18 0916)  cloZAPine (CLOZARIL) tablet 175 mg (0 mg Oral Hold 01/16/18 2238)  hydrOXYzine (ATARAX/VISTARIL) tablet 50 mg (has no administration in time range)  levothyroxine (SYNTHROID, LEVOTHROID) tablet 100 mcg (100 mcg Oral Given 01/17/18 0754)  lithium carbonate capsule  300 mg (300 mg Oral Given 01/17/18 0755)  pantoprazole (PROTONIX) EC tablet 40 mg (40 mg Oral Given 01/16/18 0915)  Oxcarbazepine (TRILEPTAL) tablet 300 mg (0 mg Oral Hold 01/16/18 2239)  prazosin (MINIPRESS) capsule 2 mg (0 mg Oral Hold 01/16/18 2239)  senna (SENOKOT) tablet 8.6 mg (has no administration in time range)  traZODone (DESYREL) tablet 50 mg (0 mg Oral Hold 01/16/18 2239)  venlafaxine XR (EFFEXOR-XR) 24 hr capsule 150 mg (150 mg Oral Given 01/17/18 0753)     Initial Impression / Assessment and Plan / ED Course  I have reviewed the triage vital signs and the nursing notes.  Pertinent labs & imaging results that were available during my care of the patient were reviewed by me and considered in my medical decision making (see chart for details).     Signed out to oncoming emergency provider.  Patient is voluntary at this point.  Final Clinical Impressions(s) / ED Diagnoses   Final diagnoses:  Ingestion of foreign body, initial encounter    ED Discharge Orders    None       Julianne Rice, MD 01/17/18 [phone]

## 2018-01-14 NOTE — ED Triage Notes (Signed)
Pt brought in by [facility] for c/o auditory hallucinations; pt swallowed a AAA battery tonight around 8pm; pt was recently released from [place] for behavioral health issues; pt states the voices she hears are telling her to kill herself

## 2018-01-15 ENCOUNTER — Emergency Department (HOSPITAL_COMMUNITY): Payer: Medicare Other | Admitting: Anesthesiology

## 2018-01-15 ENCOUNTER — Ambulatory Visit (HOSPITAL_COMMUNITY): Admit: 2018-01-15 | Payer: Medicare Other | Admitting: Internal Medicine

## 2018-01-15 ENCOUNTER — Emergency Department (HOSPITAL_COMMUNITY): Payer: Medicare Other

## 2018-01-15 ENCOUNTER — Encounter (HOSPITAL_COMMUNITY)
Admission: EM | Disposition: A | Discharge status: Other Acute Inpatient Hospital | Payer: Self-pay | Source: Home / Self Care | Attending: Emergency Medicine

## 2018-01-15 DIAGNOSIS — T182XXA Foreign body in stomach, initial encounter: Secondary | ICD-10-CM

## 2018-01-15 HISTORY — PX: ESOPHAGOGASTRODUODENOSCOPY (EGD) WITH PROPOFOL: SHX5813

## 2018-01-15 LAB — LITHIUM LEVEL: Lithium Lvl: 0.52 mmol/L — ABNORMAL LOW (ref 0.60–1.20)

## 2018-01-15 IMAGING — DX DG CHEST 2V
2 series · 2 of 2 positions shown · non-contrast
Comparison: [DATE]

CLINICAL DATA: Rotatory hallucinations. Patient's ingested a AAA
battery tonight.

EXAM:
CHEST - 2 VIEW

[chest pa]
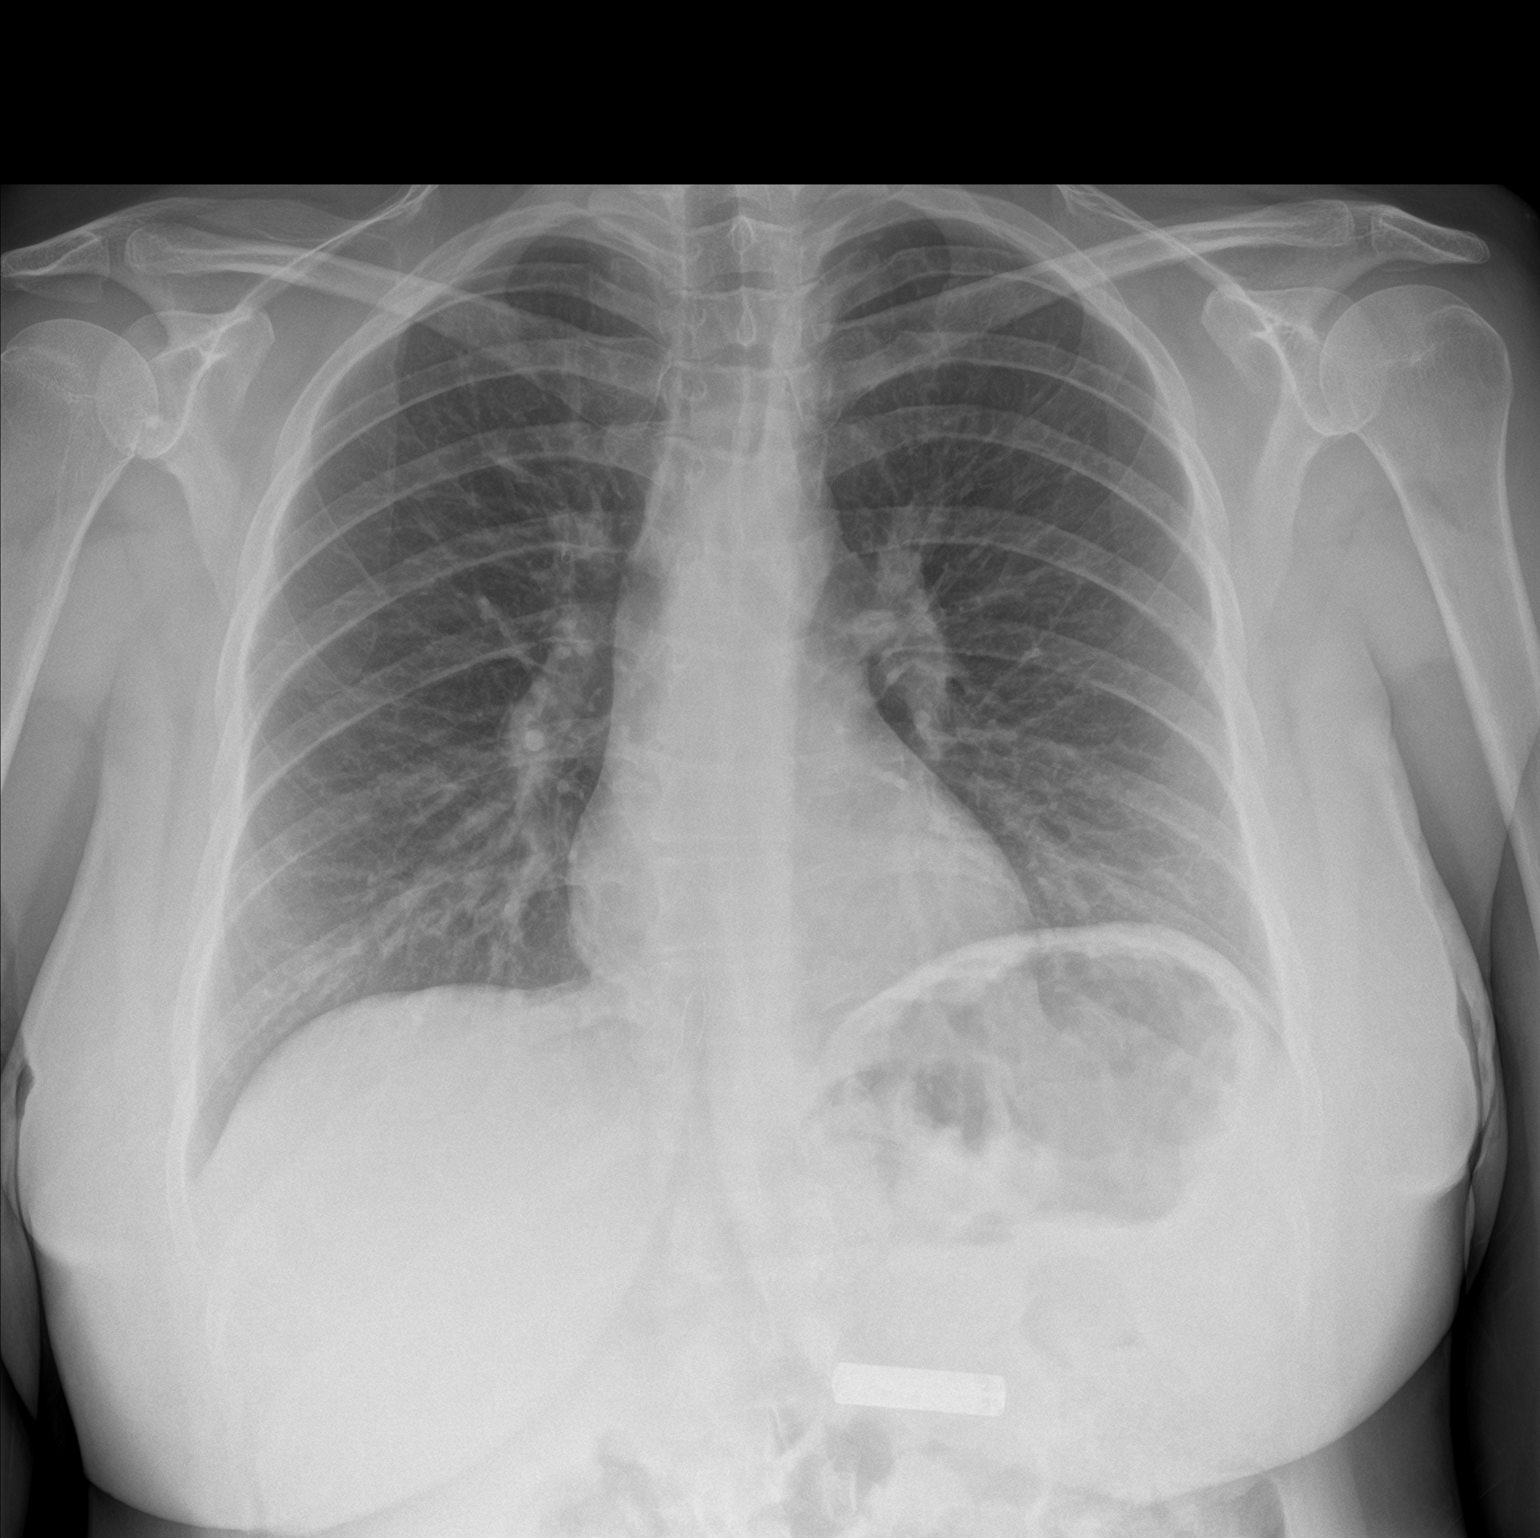

[chest lat]
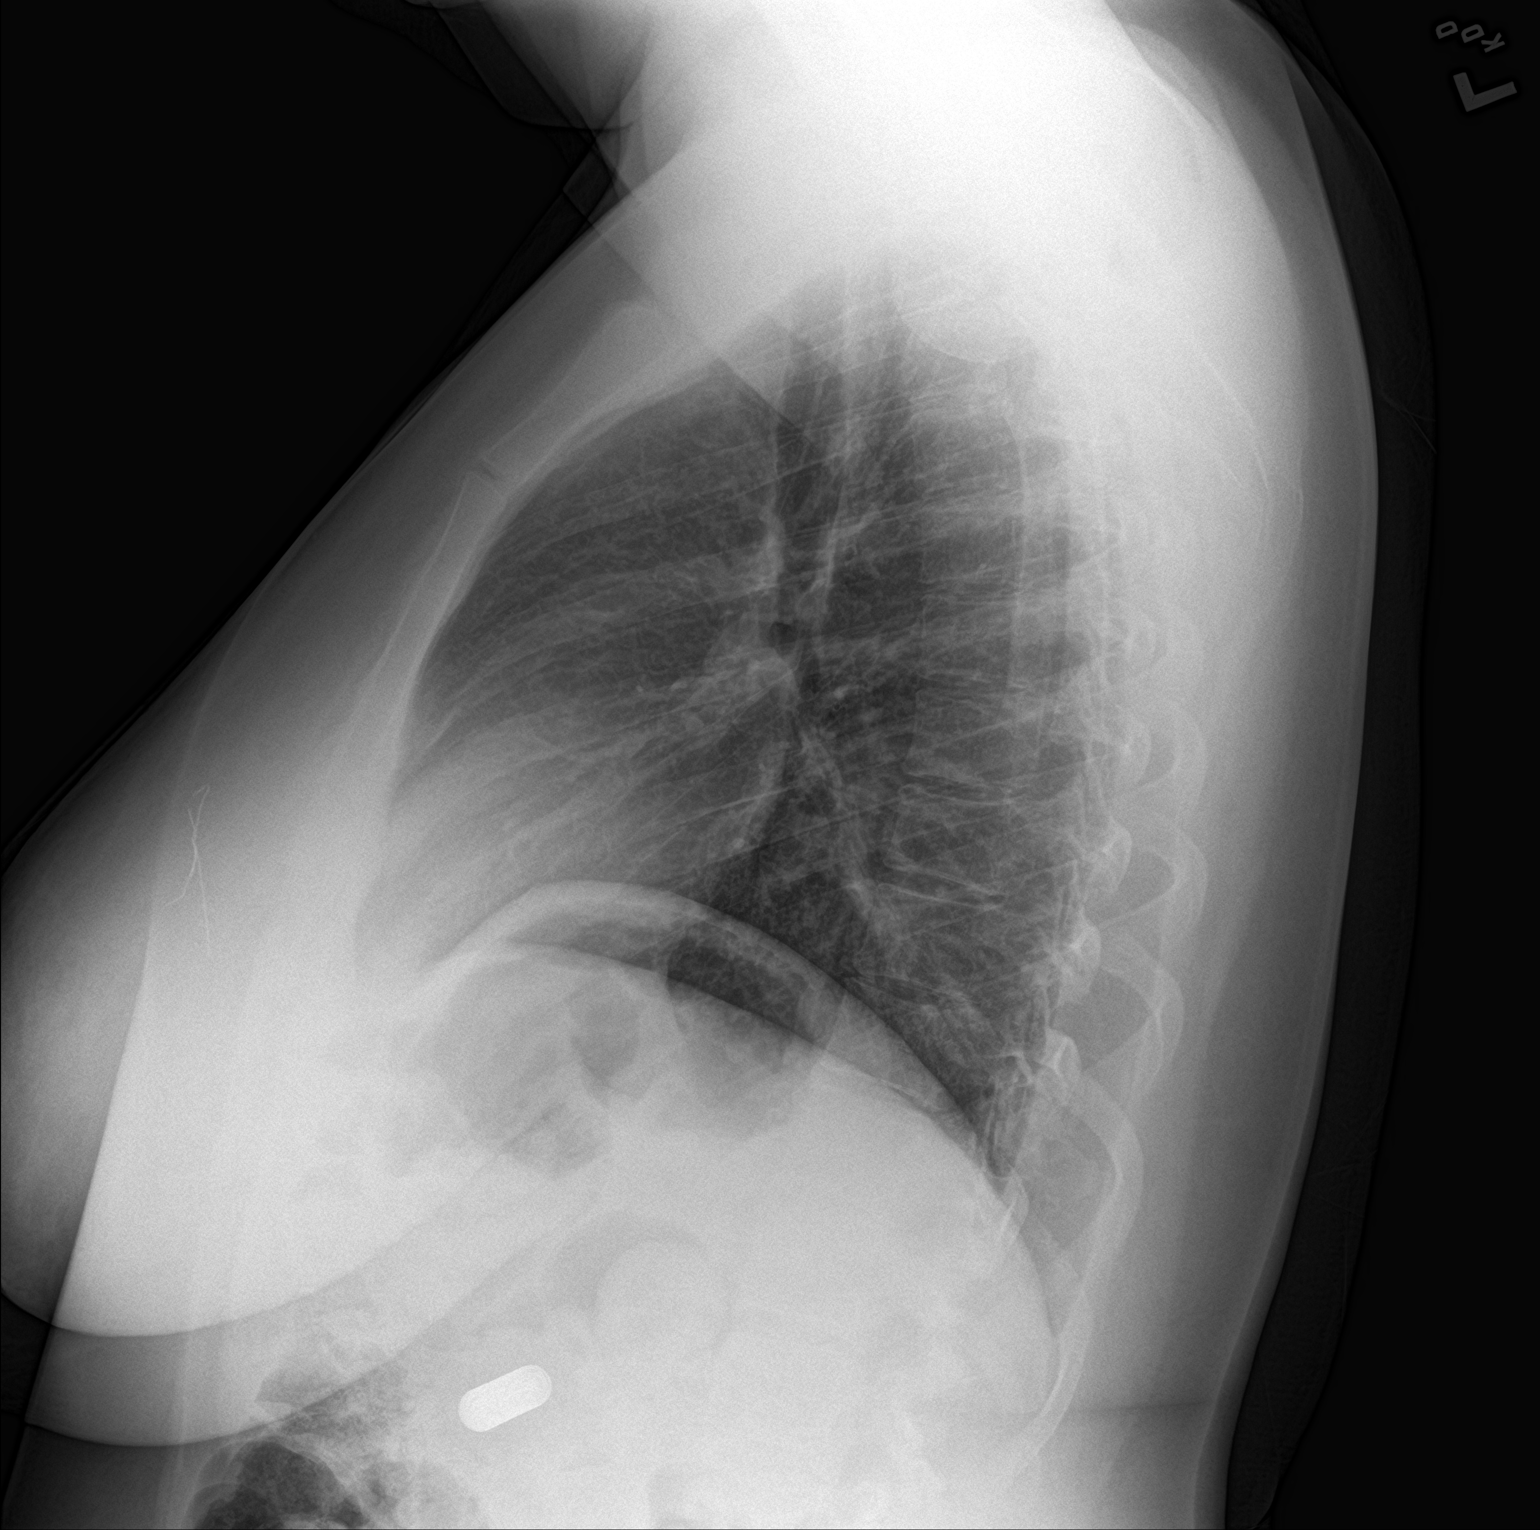

[2 of 2 positions shown; findings below may reference images not displayed]

FINDINGS: Metallic battery shaped 4.6 x 1 cm radiopaque foreign body is seen
in the left upper quadrant of the abdomen likely within the body of
the stomach. No free air is identified. No bowel obstruction is
seen. The lungs are clear. Heart and mediastinal contours are within
normal limits.
IMPRESSION: 1. No active cardiopulmonary disease.
2. Foreign body ingestion noted in the expected location of the body
of the stomach measuring 4.6 x 1 cm and battery shaped.

## 2018-01-15 SURGERY — ESOPHAGOGASTRODUODENOSCOPY (EGD) WITH PROPOFOL
Anesthesia: General

## 2018-01-15 MED ORDER — PROPOFOL 10 MG/ML IV BOLUS
INTRAVENOUS | Status: AC
  Filled 2018-01-15: qty 40

## 2018-01-15 MED ORDER — SODIUM CHLORIDE 0.9 % IV SOLN: INTRAVENOUS | Status: DC

## 2018-01-15 MED ORDER — KETAMINE HCL 10 MG/ML IJ SOLN
INTRAMUSCULAR | Status: DC | PRN
  Administered 2018-01-15: 15 mg via INTRAVENOUS

## 2018-01-15 MED ORDER — PANTOPRAZOLE SODIUM 40 MG PO TBEC
40.0000 mg | DELAYED_RELEASE_TABLET | Freq: Every day | ORAL | Status: DC
  Administered 2018-01-15 – 2018-01-21 (×7): 40 mg via ORAL
  Filled 2018-01-15 (×7): qty 1

## 2018-01-15 MED ORDER — STERILE WATER FOR IRRIGATION IR SOLN
Status: DC | PRN
  Administered 2018-01-15: 1.5 mL

## 2018-01-15 MED ORDER — FENTANYL CITRATE (PF) 100 MCG/2ML IJ SOLN
INTRAMUSCULAR | Status: AC
  Filled 2018-01-15: qty 2

## 2018-01-15 MED ORDER — PRAZOSIN HCL 1 MG PO CAPS
2.0000 mg | ORAL_CAPSULE | Freq: Every day | ORAL | Status: DC
  Administered 2018-01-15 – 2018-01-20 (×5): 2 mg via ORAL
  Filled 2018-01-15 (×8): qty 2

## 2018-01-15 MED ORDER — TRAZODONE HCL 50 MG PO TABS
50.0000 mg | ORAL_TABLET | Freq: Every day | ORAL | Status: DC
  Administered 2018-01-15 – 2018-01-20 (×5): 50 mg via ORAL
  Filled 2018-01-15 (×5): qty 1

## 2018-01-15 MED ORDER — SENNA 8.6 MG PO TABS
1.0000 | ORAL_TABLET | Freq: Every day | ORAL | Status: DC | PRN
  Administered 2018-01-17: 8.6 mg via ORAL
  Filled 2018-01-15 (×2): qty 1

## 2018-01-15 MED ORDER — SUCCINYLCHOLINE CHLORIDE 20 MG/ML IJ SOLN
INTRAMUSCULAR | Status: AC
  Filled 2018-01-15: qty 1

## 2018-01-15 MED ORDER — LACTATED RINGERS IV SOLN: INTRAVENOUS | Status: DC

## 2018-01-15 MED ORDER — VENLAFAXINE HCL ER 37.5 MG PO CP24
150.0000 mg | ORAL_CAPSULE | Freq: Every day | ORAL | Status: DC
  Administered 2018-01-15 – 2018-01-21 (×7): 150 mg via ORAL
  Filled 2018-01-15 (×8): qty 4

## 2018-01-15 MED ORDER — LEVOTHYROXINE SODIUM 50 MCG PO TABS
100.0000 ug | ORAL_TABLET | Freq: Every day | ORAL | Status: DC
  Administered 2018-01-15 – 2018-01-21 (×7): 100 ug via ORAL
  Filled 2018-01-15 (×7): qty 2

## 2018-01-15 MED ORDER — AMLODIPINE BESYLATE 5 MG PO TABS
2.5000 mg | ORAL_TABLET | Freq: Every day | ORAL | Status: DC
  Administered 2018-01-15 – 2018-01-21 (×7): 2.5 mg via ORAL
  Filled 2018-01-15 (×7): qty 1

## 2018-01-15 MED ORDER — ONDANSETRON HCL 4 MG/2ML IJ SOLN
INTRAMUSCULAR | Status: AC
  Filled 2018-01-15: qty 2

## 2018-01-15 MED ORDER — HYDROXYZINE HCL 25 MG PO TABS: 50.0000 mg | ORAL_TABLET | Freq: Two times a day (BID) | ORAL | Status: DC | PRN

## 2018-01-15 MED ORDER — OXCARBAZEPINE 300 MG PO TABS
300.0000 mg | ORAL_TABLET | Freq: Two times a day (BID) | ORAL | Status: DC
  Administered 2018-01-15 – 2018-01-21 (×12): 300 mg via ORAL
  Filled 2018-01-15 (×16): qty 1

## 2018-01-15 MED ORDER — LACTATED RINGERS IV SOLN
INTRAVENOUS | Status: DC | PRN
  Administered 2018-01-15 (×2): via INTRAVENOUS

## 2018-01-15 MED ORDER — LORATADINE 10 MG PO TABS
10.0000 mg | ORAL_TABLET | Freq: Every day | ORAL | Status: DC
  Administered 2018-01-15 – 2018-01-21 (×7): 10 mg via ORAL
  Filled 2018-01-15 (×7): qty 1

## 2018-01-15 MED ORDER — FENTANYL CITRATE (PF) 100 MCG/2ML IJ SOLN: 25.0000 ug | INTRAMUSCULAR | Status: DC | PRN

## 2018-01-15 MED ORDER — FENTANYL CITRATE (PF) 100 MCG/2ML IJ SOLN
INTRAMUSCULAR | Status: DC | PRN
  Administered 2018-01-15 (×2): 50 ug via INTRAVENOUS

## 2018-01-15 MED ORDER — PROPOFOL 10 MG/ML IV BOLUS
INTRAVENOUS | Status: DC | PRN
  Administered 2018-01-15: 50 mg via INTRAVENOUS
  Administered 2018-01-15: 150 mg via INTRAVENOUS

## 2018-01-15 MED ORDER — LITHIUM CARBONATE 300 MG PO CAPS
300.0000 mg | ORAL_CAPSULE | Freq: Two times a day (BID) | ORAL | Status: DC
  Administered 2018-01-15 – 2018-01-21 (×13): 300 mg via ORAL
  Filled 2018-01-15 (×13): qty 1

## 2018-01-15 MED ORDER — CLOZAPINE 25 MG PO TABS
175.0000 mg | ORAL_TABLET | Freq: Every day | ORAL | Status: DC
  Administered 2018-01-15 – 2018-01-20 (×5): 175 mg via ORAL
  Filled 2018-01-15 (×7): qty 3

## 2018-01-15 MED ORDER — KETAMINE HCL 10 MG/ML IJ SOLN
INTRAMUSCULAR | Status: AC
  Filled 2018-01-15: qty 1

## 2018-01-15 MED ORDER — SUCCINYLCHOLINE CHLORIDE 20 MG/ML IJ SOLN
INTRAMUSCULAR | Status: DC | PRN
  Administered 2018-01-15: 120 mg via INTRAVENOUS

## 2018-01-15 MED ORDER — HYDROCODONE-ACETAMINOPHEN 7.5-325 MG PO TABS: 1.0000 | ORAL_TABLET | Freq: Once | ORAL | Status: DC | PRN

## 2018-01-15 MED ORDER — ONDANSETRON HCL 4 MG/2ML IJ SOLN
INTRAMUSCULAR | Status: DC | PRN
  Administered 2018-01-15: 4 mg via INTRAVENOUS
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
## 2018-01-15 NOTE — ED Notes (Signed)
TTS in room.  

## 2018-01-15 NOTE — H&P (Signed)
@LOGO @   Primary Care Physician:  Casilda Carls, MD (Inactive) Primary Gastroenterologist:  Dr. Gala Romney  Pre-Procedure History & Physical: HPI:  Melanie Bell is a 27 y.o. female here for further evaluation/managent of foreign body.  Presented to ED this evening stating she swallowed a triple-A batterry this evening.  Significant psychiatric history. History of multiple foreign body  Ingestions.  Patient denies swallowing anything other than a single AAA battery this evening. She presents to have it removed.KUB demonstrates a foreign body consistent with a AA or AAA battery - likely in the stomach.  Past Medical History:  Diagnosis Date  . Anxiety   . Asthma   . Depression   . GERD (gastroesophageal reflux disease)   . Hallucinations 09/30/2014   Sizoaffective  . Hyperlipidemia   . Hypertension   . Intentional ingestion of batteries 04/21/2016    2 AAA batteries and 1 thumb tack; intent to hurt herself/notes 04/21/2016  . Tardive dyskinesia 10/2014   recent onset    Past Surgical History:  Procedure Laterality Date  . ABDOMINAL SURGERY     "years ago" to remove foreign objects  . APPENDECTOMY    . BREAST EXCISIONAL BIOPSY Right 09/03/2007   surgical bx removed fibroadeoma  . BREAST LUMPECTOMY Right   . COLONOSCOPY WITH PROPOFOL N/A 09/10/2015   Procedure: COLONOSCOPY WITH PROPOFOL;  Surgeon: [name], MD;  Location: [facility] ENDOSCOPY;  Service: Endoscopy;  Laterality: N/A;  . ESOPHAGOGASTRODUODENOSCOPY N/A 11/28/2014   Procedure: ESOPHAGOGASTRODUODENOSCOPY (EGD);  Surgeon: [name], MD;  Location: [facility] ENDOSCOPY;  Service: Endoscopy;  Laterality: N/A;  . ESOPHAGOGASTRODUODENOSCOPY N/A 02/21/2016   Procedure: ESOPHAGOGASTRODUODENOSCOPY (EGD);  Surgeon: Mauri Pole, MD;  Location: [facility] ENDOSCOPY;  Service: Endoscopy;  Laterality: N/A;  . ESOPHAGOGASTRODUODENOSCOPY N/A 04/21/2016   Procedure: ESOPHAGOGASTRODUODENOSCOPY (EGD);  Surgeon: Gatha Mayer, MD;   Location: [facility] ENDOSCOPY;  Service: Endoscopy;  Laterality: N/A;  . ESOPHAGOGASTRODUODENOSCOPY N/A 05/06/2016   Procedure: ESOPHAGOGASTRODUODENOSCOPY (EGD);  Surgeon: Jonathon Bellows, MD;  Location: [facility] ENDOSCOPY;  Service: Gastroenterology;  Laterality: N/A;  . ESOPHAGOGASTRODUODENOSCOPY N/A 06/13/2016   Procedure: ESOPHAGOGASTRODUODENOSCOPY (EGD);  Surgeon: [name], MD;  Location: [facility] ENDOSCOPY;  Service: Endoscopy;  Laterality: N/A;  . ESOPHAGOGASTRODUODENOSCOPY (EGD) WITH PROPOFOL N/A 02/29/2016   Procedure: ESOPHAGOGASTRODUODENOSCOPY (EGD) WITH PROPOFOL;  Surgeon: [name], MD;  Location: [facility] ENDOSCOPY;  Service: Endoscopy;  Laterality: N/A;  . LAPAROTOMY N/A 09/12/2015   Procedure: EXPLORATORY LAPAROTOMY;  Surgeon: Florene Glen, MD;  Location: [facility] ORS;  Service: General;  Laterality: N/A;  . SIGMOIDOSCOPY N/A 09/12/2015   Procedure: Lonell Face;  Surgeon: Florene Glen, MD;  Location: [facility] ORS;  Service: General;  Laterality: N/A;  . WISDOM TOOTH EXTRACTION      Prior to Admission medications   Medication Sig Start Date End Date Taking? Authorizing Provider  amLODipine (NORVASC) 2.5 MG tablet Take 2.5 mg by mouth daily.   Yes [provider]  cloZAPine (CLOZARIL) 25 MG tablet Take 7 tablets (175 mg total) by mouth at bedtime. 01/12/18 02/11/18 Yes McNew, Tyson Babinski, MD  docusate sodium (COLACE) 100 MG capsule Take 1 capsule (100 mg total) by mouth daily as needed. Patient taking differently: Take 100 mg by mouth daily as needed for mild constipation or moderate constipation.  12/16/17 12/16/18 Yes Schaevitz, Randall An, MD  hydrOXYzine (ATARAX/VISTARIL) 50 MG tablet Take 1 tablet (50 mg total) by mouth 2 (two) times daily as needed. 12/11/17  Yes [name], [name], MD  levothyroxine (SYNTHROID, LEVOTHROID) 100 MCG tablet  Take 1 tablet (100 mcg total) by mouth daily before breakfast. 12/11/17  Yes [name], [name], MD  lithium carbonate 300 MG capsule Take 1 capsule  (300 mg total) by mouth 2 (two) times daily with a meal. 12/11/17  Yes [name], [name], MD  medroxyPROGESTERone (DEPO-PROVERA) 150 MG/ML injection Inject 1 mL (150 mg total) into the muscle every 3 (three) months. 12/11/17  Yes [name], [name], MD  omeprazole (PRILOSEC) 20 MG capsule Take 1 capsule (20 mg total) by mouth daily. 12/11/17  Yes [name], [name], MD  OXcarbazepine (TRILEPTAL) 300 MG tablet Take 1 tablet (300 mg total) by mouth 2 (two) times daily. 12/11/17  Yes [name], [name], MD  polyethylene glycol (MIRALAX / GLYCOLAX) packet Take 17 g by mouth daily as needed. 12/11/17  Yes [name], [name], MD  prazosin (MINIPRESS) 2 MG capsule Take 1 capsule (2 mg total) by mouth at bedtime. 01/12/18  Yes McNew, Tyson Babinski, MD  traZODone (DESYREL) 50 MG tablet Take 1 tablet (50 mg total) by mouth at bedtime. 12/11/17  Yes [name], [name], MD  venlafaxine XR (EFFEXOR-XR) 150 MG 24 hr capsule Take 1 capsule (150 mg total) by mouth daily with breakfast. 12/11/17  Yes [name], [name], MD  Vitamin D, Ergocalciferol, (DRISDOL) 50000 units CAPS capsule Take 1 capsule (50,000 Units total) by mouth every 7 (seven) days. 12/11/17  Yes [name], [name], MD  cetirizine (ZYRTEC) 10 MG tablet Take 1 tablet (10 mg total) by mouth daily as needed for allergies. 12/11/17   [name], Wardell Honour, MD  senna (SENOKOT) 8.6 MG TABS tablet Take 1 tablet (8.6 mg total) by mouth daily as needed for mild constipation. 12/11/17   [name], MD    Allergies as of 01/14/2018 - Review Complete 01/14/2018  Allergen Reaction Noted  . Betadine [povidone iodine] Other (See Comments) 09/29/2014  . Iodine Rash 09/29/2014  . Shellfish-derived products Other (See Comments) 11/05/2014    Family History  Problem Relation Age of Onset  . Depression Mother   . Hypertension Mother   . Sleep apnea Mother   . Asthma Mother   . COPD Mother   . Diabetes Mother   . Breast cancer Maternal  Aunt        20's    Social History   Socioeconomic History  . Marital status: Single    Spouse name: Not on file  . Number of children: Not on file  . Years of education: Not on file  . Highest education level: Not on file  Occupational History  . Not on file  Social Needs  . Financial resource strain: Not on file  . Food insecurity:    Worry: Not on file    Inability: Not on file  . Transportation needs:    Medical: Not on file    Non-medical: Not on file  Tobacco Use  . Smoking status: Former Smoker    Packs/day: 0.50    Years: 3.00    Pack years: 1.50    Types: Cigarettes    Last attempt to quit: 08/2016    Years since quitting: 1.4  . Smokeless tobacco: Never Used  Substance and Sexual Activity  . Alcohol use: No  . Drug use: No  . Sexual activity: Never    Birth control/protection: Injection  Lifestyle  . Physical activity:    Days per week: Not on file    Minutes per session: Not on file  . Stress: Not on file  Relationships  . Social connections:    Talks on phone: Not on file    Gets together: Not on file    Attends religious service: Not on file    Active member of club or organization: Not on file    Attends meetings of clubs or organizations: Not on file    Relationship status: Not on file  . Intimate partner violence:    Fear of current or ex partner: Not on file    Emotionally abused: Not on file    Physically abused: Not on file    Forced sexual activity: Not on file  Other Topics Concern  . Not on file  Social History Narrative  . Not on file    Review of Systems: See HPI, otherwise negative ROS  Physical Exam: BP 102/65 (BP Location: Left Arm)   Pulse 68   Temp 98 F (36.7 C) (Oral)   Resp 16   Ht 5\' 6"  (1.676 m)   Wt 99.8 kg   LMP 01/02/2018   SpO2 97%   BMI 35.51 kg/m  General:   Alert,   pleasant and cooperative in NAD. Flat affect Neck:  Supple; no masses or thyromegaly. No significant cervical adenopathy. Lungs:  Clear  throughout to auscultation.   No wheezes, crackles, or rhonchi. No acute distress. Heart:  Regular rate and rhythm; no murmurs, clicks, rubs,  or gallops. Abdomen: Non-distended, normal bowel sounds.  Soft and nontender without appreciable mass or hepatosplenomegaly.  Pulses:  Normal pulses noted. Extremities:  Without clubbing or edema.  Impression/Plan:  27 year old lady with a significant psychiatric history presents with a foreign body ingestion-likely a battery. I've discussed the  Importance of battery removal to minimize morbidity reviewed with pt.  The risks, benefits, limitations, alternatives and imponderables have been reviewed with the patient. Potential for esophageal dilation, biopsy, etc. have also been reviewed.  Questions have been answered. All parties agreeable.  Discussed with Drucie Opitz, CRNA and Dr. Wyvonnia Dusky in the ED.   Notice: This dictation was prepared with Dragon dictation along with smaller phrase technology. Any transcriptional errors that result from this process are unintentional and may not be corrected upon review.

## 2018-01-15 NOTE — Discharge Instructions (Addendum)
EGD Discharge instructions Please read the instructions outlined below and refer to this sheet in the next few weeks. These discharge instructions provide you with general information on caring for yourself after you leave the hospital. Your doctor may also give you specific instructions. While your treatment has been planned according to the most current medical practices available, unavoidable complications occasionally occur. If you have any problems or questions after discharge, please call your doctor. ACTIVITY  You may resume your regular activity but move at a slower pace for the next 24 hours.   Take frequent rest periods for the next 24 hours.   Walking will help expel (get rid of) the air and reduce the bloated feeling in your abdomen.   No driving for 24 hours (because of the anesthesia (medicine) used during the test).   You may shower.   Do not sign any important legal documents or operate any machinery for 24 hours (because of the anesthesia used during the test).  NUTRITION  Drink plenty of fluids.   You may resume your normal diet.   Begin with a light meal and progress to your normal diet.   Avoid alcoholic beverages for 24 hours or as instructed by your caregiver.  MEDICATIONS  You may resume your normal medications unless your caregiver tells you otherwise.  WHAT YOU CAN EXPECT TODAY  You may experience abdominal discomfort such as a feeling of fullness or gas pains.  FOLLOW-UP  Your doctor will discuss the results of your test with you.  SEEK IMMEDIATE MEDICAL ATTENTION IF ANY OF THE FOLLOWING OCCUR:  Excessive nausea (feeling sick to your stomach) and/or vomiting.   Severe abdominal pain and distention (swelling).   Trouble swallowing.   Temperature over 101 F (37.8 C).   Rectal bleeding or vomiting of blood.    Do not swallow objects that are not meant to be swallowed  Follow-up with your primary care physician.

## 2018-01-15 NOTE — Transfer of Care (Signed)
Immediate Anesthesia Transfer of Care Note  Patient: Melanie Bell  Procedure(s) Performed: ESOPHAGOGASTRODUODENOSCOPY (EGD) WITH PROPOFOL (N/A )  Patient Location: PACU  Anesthesia Type:General  Level of Consciousness: awake  Airway & Oxygen Therapy: Patient Spontanous Breathing  Post-op Assessment: Report given to RN  Post vital signs: Reviewed  Last Vitals:  Vitals Value Taken Time  BP    Temp    Pulse 94 01/15/2018  3:33 AM  Resp 22 01/15/2018  3:33 AM  SpO2 95 % 01/15/2018  3:33 AM  Vitals shown include unvalidated device data.  Last Pain:  Vitals:   01/15/18 0249  TempSrc:   PainSc: 0-No pain         Complications: No apparent anesthesia complications

## 2018-01-15 NOTE — ED Notes (Signed)
Pt given meal tray. Pt is now awake & eating.

## 2018-01-15 NOTE — Anesthesia Procedure Notes (Signed)
Procedure Name: Intubation Date/Time: 01/15/2018 2:24 AM Performed by: Lenice Llamas, MD Pre-anesthesia Checklist: Patient identified, Patient being monitored, Timeout performed, Emergency Drugs available and Suction available Patient Re-evaluated:Patient Re-evaluated prior to induction Oxygen Delivery Method: Circle System Utilized Preoxygenation: Pre-oxygenation with 100% oxygen Induction Type: IV induction Ventilation: Mask ventilation without difficulty Laryngoscope Size: Mac and 4 Grade View: Grade I Tube type: Oral Tube size: 7.0 mm Number of attempts: 1 Airway Equipment and Method: Stylet Placement Confirmation: ETT inserted through vocal cords under direct vision,  positive ETCO2 and breath sounds checked- equal and bilateral Secured at: 21 cm Tube secured with: Tape Dental Injury: Teeth and Oropharynx as per pre-operative assessment

## 2018-01-15 NOTE — ED Notes (Signed)
tts in progress 

## 2018-01-15 NOTE — ED Notes (Signed)
Pt escorted to the shower by this sitter °

## 2018-01-15 NOTE — Anesthesia Postprocedure Evaluation (Signed)
Anesthesia Post Note  Patient: Melanie Bell  Procedure(s) Performed: ESOPHAGOGASTRODUODENOSCOPY (EGD) WITH PROPOFOL (N/A )  Patient location during evaluation: PACU Anesthesia Type: General Level of consciousness: awake Pain management: pain level controlled Vital Signs Assessment: post-procedure vital signs reviewed and stable Respiratory status: spontaneous breathing Cardiovascular status: blood pressure returned to baseline Postop Assessment: no headache Anesthetic complications: no     Last Vitals:  Vitals:   01/15/18 0124 01/15/18 0331  BP: 102/65 121/74  Pulse: 68 96  Resp: 16 (!) 22  Temp: 36.7 C 37 C  SpO2: 97% 97%    Last Pain:  Vitals:   01/15/18 0331  TempSrc:   PainSc: Asleep                 Lenice Llamas

## 2018-01-15 NOTE — ED Provider Notes (Addendum)
Patient care assumed from Dr. Lita Mains at 0000. Patient with auditory hallucinations and reportedly swallowed AAA battery tonight. Hx of same.  Awaiting xrays. Will need discussion with GI and TTS.  X-ray shows ingested battery likely in the stomach.  No perforation.  Discussed with Bernarda Caffey as well as Dr. Gala Romney of gastroenterology.  They will arrange endoscopy tonight to remove battery.  Patient will then need to return to the ED for TTS evaluation.  Patient to endoscopy with Dr. Gala Romney. Team aware she will need to return to the ED at completion for TTS evaluation.  Patient has returned from endoscopy at 4:30 AM.  She is resting comfortably.  She is awake and answering questions.  Screening labs are reassuring.  She is medically clear for TTS evaluation.   Ezequiel Essex, MD 01/15/18 Suezanne Jacquet, MD 01/15/18 [phone]

## 2018-01-15 NOTE — ED Notes (Signed)
Pt transported to ENDO by ENDO team.

## 2018-01-15 NOTE — Op Note (Signed)
[facility] Patient [name] Procedure Date: 01/15/2018 2:08 AM MRN: [id number] Date of Birth: [birth year] Attending MD: Norvel Richards , MD CSN: [id number] Age: 27 Admit Type: Outpatient Procedure:                Upper GI endoscopy Indications:              Foreign body in the stomach Providers:                Norvel Richards, MD, Charlsie Quest. Theda Sers RN, RN,                            Lurline Del, RN Referring MD:             [place] Medicines:                Propofol per Anesthesia, General Anesthesia Complications:            No immediate complications. Estimated Blood Loss:     Estimated blood loss: none. Procedure:                Pre-Anesthesia Assessment:                           - Prior to the procedure, a History and Physical                            was performed, and patient medications and                            allergies were reviewed. The patient's tolerance of                            previous anesthesia was also reviewed. The risks                            and benefits of the procedure and the sedation                            options and risks were discussed with the patient.                            All questions were answered, and informed consent                            was obtained. Prior Anticoagulants: The patient has                            taken no previous anticoagulant or antiplatelet                            agents. ASA Grade Assessment: II - A patient with                            mild systemic disease. After reviewing the risks  and benefits, the patient was deemed in                            satisfactory condition to undergo the procedure.                           After obtaining informed consent, the endoscope was                            passed under direct vision. Throughout the                            procedure, the patient's blood pressure, pulse, and             oxygen saturations were monitored continuously. The                            [id number]-H190 ([id number]) scope was introduced through the                            mouth, and advanced to the second part of duodenum.                            The upper GI endoscopy was accomplished without                            difficulty. The patient tolerated the procedure                            well. Scope In: 2:39:36 AM Scope Out: 3:20:26 AM Total Procedure Duration: 0 hours 40 minutes 50 seconds  Findings:      Normal-appearing esophagus. Much retained food in the stomach (query       green beans). I trolled with the snare and rescue net - found a triple-A       battery. I briefly used the bio-Vac to remove some of the gastric       contents for better visualization. I was eventually able to snare the       end of the battery and remove it from the patient intact. Impression:               - Normal esophagus. Retained gastric contents. AAA                            battery in stomach removed as described                           - Normal examined duodenum.                           - Moderate Sedation:      Moderate (conscious) sedation was administered by the endoscopy nurse       and supervised by the endoscopist. The following parameters were       monitored: oxygen saturation, heart rate, blood pressure, respiratory       rate, EKG, adequacy of pulmonary ventilation, and response to  care.       Total physician intraservice time was 44 minutes. Recommendation:           - Patient has a contact number available for                            emergencies. The signs and symptoms of potential                            delayed complications were discussed with the                            patient. Return to normal activities tomorrow.                            Written discharge instructions were provided to the                            patient.                           -  Advance diet as tolerated.                           - Continue present medications.                           - No repeat upper endoscopy.                           - Return to GI office PRN. Procedure Code(s):        --- Professional ---                           [phone], Moderate sedation services provided by the                            same physician or other qualified health care                            professional performing the diagnostic or                            therapeutic service that the sedation supports,                            requiring the presence of an independent trained                            observer to assist in the monitoring of the                            patient's level of consciousness and physiological                            status; initial 15 minutes of intraservice time,  patient age 63 years or older                           [phone], Moderate sedation services provided by the                            same physician or other qualified health care                            professional performing the diagnostic or                            therapeutic service that the sedation supports,                            requiring the presence of an independent trained                            observer to assist in the monitoring of the                            patient's level of consciousness and physiological                            status; each additional 15 minutes intraservice                            time (List separately in addition to code for                            primary service)                           [phone], Moderate sedation services provided by the                            same physician or other qualified health care                            professional performing the diagnostic or                            therapeutic service that the sedation supports,                             requiring the presence of an independent trained                            observer to assist in the monitoring of the                            patient's level of consciousness and physiological                            status;  each additional 15 minutes intraservice                            time (List separately in addition to code for                            primary service) Diagnosis Code(s):        --- Professional ---                           Y22.3VKP, Foreign body in stomach, initial encounter CPT copyright 2017 [employer]. All rights reserved. The codes documented in this report are preliminary and upon coder review may  be revised to meet current compliance requirements. Cristopher Estimable. Ellarose Brandi, MD Norvel Richards, MD 01/15/2018 3:40:32 AM This report has been signed electronically. Number of Addenda: 0

## 2018-01-15 NOTE — BH Assessment (Signed)
[facility] Assessment Progress Note    Patient was seen for re-assessment.  Patient states that she continues to hear voices that are telling her to hurt herself and she states that she is unable to contract for safety.  Patient is in agreement that she would benefit from an inpatient hospitalization for her psych issues.  TTS to continue to seek placement for patient.

## 2018-01-15 NOTE — [facility] Counselor (Signed)
01/15/18 per Elmarie Shiley, NP, continued inpatient is recommended. TTS will look for placement. Patient referred to the following hospitals for placement: [facility], [facility], [facility], [facility], [facility], [facility], [facility], [facility], [facility], [facility], [facility], and [facility]. Pending review. TTS will continue seeking placement.

## 2018-01-15 NOTE — Progress Notes (Signed)
Unable to contact mother (who is listed as guardian) for consent .  Dr. Gala Romney deemed case emergency.

## 2018-01-15 NOTE — ED Notes (Signed)
TTS in progress 

## 2018-01-15 NOTE — ED Notes (Signed)
Pt assisted to shower with sitter

## 2018-01-15 NOTE — ED Notes (Signed)
ACT Team member called . Number given for Crisis # [phone]. Act team state they are typically the point of contact since Mother may not answer her phone [name] is Legal guardian [phone]

## 2018-01-15 NOTE — Anesthesia Preprocedure Evaluation (Signed)
Anesthesia Evaluation  Patient identified by MRN, date of birth, ID band Patient awake    Reviewed: Allergy & Precautions, NPO status , Patient's Chart, lab work & pertinent test results  Airway Mallampati: II  TM Distance: >3 FB Neck ROM: Full    Dental no notable dental hx. (+) Teeth Intact   Pulmonary neg pulmonary ROS, asthma , former smoker,    Pulmonary exam normal breath sounds clear to auscultation       Cardiovascular Exercise Tolerance: Good hypertension, negative cardio ROS Normal cardiovascular examI Rhythm:Regular Rate:Normal     Neuro/Psych Anxiety Depression Bipolar Disorder Schizophrenia negative neurological ROS  negative psych ROS   GI/Hepatic negative GI ROS, Neg liver ROS, GERD  Medicated and Controlled,Swallowed AAA battery at 8p  Last po food about 5pm    Endo/Other  negative endocrine ROSHypothyroidism   Renal/GU negative Renal ROS  negative genitourinary   Musculoskeletal negative musculoskeletal ROS (+)   Abdominal   Peds negative pediatric ROS (+)  Hematology negative hematology ROS (+)   Anesthesia Other Findings   Reproductive/Obstetrics negative OB ROS                             Anesthesia Physical Anesthesia Plan  ASA: II and emergent  Anesthesia Plan: General   Post-op Pain Management:    Induction: Intravenous  PONV Risk Score and Plan:   Airway Management Planned: Simple Face Mask and Nasal Cannula  Additional Equipment:   Intra-op Plan:   Post-operative Plan: Extubation in OR  Informed Consent: I have reviewed the patients History and Physical, chart, labs and discussed the procedure including the risks, benefits and alternatives for the proposed anesthesia with the patient or authorized representative who has indicated his/her understanding and acceptance.   Dental advisory given  Plan Discussed with: CRNA  Anesthesia Plan Comments:  (Will try without ETT and GETA as needed )        Anesthesia Quick Evaluation

## 2018-01-15 NOTE — Progress Notes (Signed)
Pt meets inpatient criteria per Patriciaann Clan, PA. Referral information has been sent to the following hospitals for review: [facility]  [facility]  [facility]   CSW will continue to assist with placement needs.   Audree Camel, LCSW, [place] [facility]/TTS [phone]

## 2018-01-16 ENCOUNTER — Other Ambulatory Visit: Payer: Self-pay

## 2018-01-16 ENCOUNTER — Encounter (HOSPITAL_COMMUNITY): Payer: Self-pay | Admitting: Registered Nurse

## 2018-01-16 DIAGNOSIS — F250 Schizoaffective disorder, bipolar type: Secondary | ICD-10-CM

## 2018-01-16 DIAGNOSIS — T189XXA Foreign body of alimentary tract, part unspecified, initial encounter: Secondary | ICD-10-CM

## 2018-01-16 DIAGNOSIS — T182XXA Foreign body in stomach, initial encounter: Secondary | ICD-10-CM | POA: Diagnosis not present

## 2018-01-16 DIAGNOSIS — T1491XA Suicide attempt, initial encounter: Secondary | ICD-10-CM

## 2018-01-16 MED ORDER — CLOZAPINE 100 MG PO TABS
ORAL_TABLET | ORAL | Status: AC
  Filled 2018-01-16: qty 2

## 2018-01-16 NOTE — ED Notes (Signed)
Pt resting with eyes closed, appears to be in no distress. Respirations are even and unlabored. Sitter at bedside.  

## 2018-01-16 NOTE — ED Notes (Signed)
Pt ate all of dinner tray. 

## 2018-01-16 NOTE — Consult Note (Signed)
Telepsych Consultation   Reason for Consult:  Suicidal ideation & auditory hallucinations Referring Physician:  Ezequiel Essex, MD Location of Patient: [facility] Location of Provider: [facility]  Patient Identification: Melanie Bell MRN:  [id number] Principal Diagnosis: Schizoaffective disorder, bipolar type [facility]) Diagnosis:   Patient Active Problem List   Diagnosis Date Noted  . Suicide attempt ([place]) [T14.91XA] 01/16/2018  . Dysfunctional uterine bleeding [N93.8] 01/06/2018  . Borderline personality disorder ([place]) [F60.3] 01/04/2018  . PTSD (post-traumatic stress disorder) [F43.10] 01/03/2018  . Suicidal ideation [R45.851] 01/02/2018  . Self-inflicted injury [P38.25] 11/23/2017  . ASCUS of cervix with negative high risk HPV [R87.610] 08/28/2017  . Malingering [Z76.5] 05/06/2016  . Foreign body aspiration [T17.900A]   . Other foreign object in esophagus causing other injury, subsequent encounter [T18.198D]   . Swallowed foreign body [T18.9XXA] 04/23/2016  . Foreign body ingestion [T18.9XXA] 04/21/2016  . Gastric foreign body [T18.2XXA]   . 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 Schizoaffective disorder, bipolar type ([place]) [F25.0] 11/06/2014  . Tobacco use disorder [F17.200] 09/30/2014  . Hypothyroidism [E03.9] 09/29/2014  . Hypertension [I10] 09/29/2014    Total Time spent with patient: 30 minutes  Subjective:   Melanie Bell is a 27 y.o. female patient presented to [facility] after swallowing AAA battery as a suicide attempt and auditory hallucinations telling her to kill herself  HPI:  Melanie Bell, 27 y.o., female patient seen via telepsych by this provider; chart reviewed and consulted with Dr. Dwyane Dee on 01/16/18.  On evaluation Melanie Bell reports that she continues to hear voices telling her to kill herself.  States she swallowed a battery in an attempt to kill herself and states that she has had multiple attempts of trying to kill  herself via overdose and swallowing other things such as razor blades and other things.  Patient is unable to contract for safety  Past Psychiatric History: Borderline personality disorder, depression, schizoaffective disorder, bipolar type, PTSD  Risk to Self: Suicidal Ideation: Yes-Currently Present Suicidal Intent: Yes-Currently Present Is patient at risk for suicide?: Yes Suicidal Plan?: Yes-Currently Present(Cheek meds then overdose) Specify Current Suicidal Plan: Cheek meds and overdose; swallow battery Access to Means: Yes Specify Access to Suicidal Means: medication What has been your use of drugs/alcohol within the last 12 months?: NA How many times?: 10 Triggers for Past Attempts: Other personal contacts, Anniversary, Hallucinations Intentional Self Injurious Behavior: Cutting Comment - Self Injurious Behavior: Pt report its been years ago Risk to Others: Homicidal Ideation: No Thoughts of Harm to Others: No Current Homicidal Intent: No Current Homicidal Plan: No Access to Homicidal Means: No History of harm to others?: No Assessment of Violence: None Noted Does patient have access to weapons?: No Criminal Charges Pending?: No Does patient have a court date: No Prior Inpatient Therapy: Prior Inpatient Therapy: Yes Prior Therapy Dates: 12/2017,multiples Prior Therapy Facilty/Provider(s): [facility], [facility], [facility], [facility] Reason for Treatment: MH Prior Outpatient Therapy: Prior Outpatient Therapy: Yes Prior Therapy Dates: ongoing Prior Therapy Facilty/Provider(s): [facility] Reason for Treatment: MH Does patient have an ACCT team?: Yes Does patient have Intensive In-House Services?  : No Does patient have [facility] services? : No Does patient have P4CC services?: No  Past Medical History:  Past Medical History:  Diagnosis Date  . Anxiety   . Asthma   . Depression   . GERD (gastroesophageal reflux disease)   . Hallucinations 09/30/2014    Sizoaffective  . Hyperlipidemia   . Hypertension   . Intentional  ingestion of batteries 04/21/2016    2 AAA batteries and 1 thumb tack; intent to hurt herself/notes 04/21/2016  . Tardive dyskinesia 10/2014   recent onset    Past Surgical History:  Procedure Laterality Date  . ABDOMINAL SURGERY     "years ago" to remove foreign objects  . APPENDECTOMY    . BREAST EXCISIONAL BIOPSY Right 09/03/2007   surgical bx removed fibroadeoma  . BREAST LUMPECTOMY Right   . COLONOSCOPY WITH PROPOFOL N/A 09/10/2015   Procedure: COLONOSCOPY WITH PROPOFOL;  Surgeon: [name], MD;  Location: [facility] ENDOSCOPY;  Service: Endoscopy;  Laterality: N/A;  . ESOPHAGOGASTRODUODENOSCOPY N/A 11/28/2014   Procedure: ESOPHAGOGASTRODUODENOSCOPY (EGD);  Surgeon: [name], MD;  [facility]COPY;  Service: Endoscopy;  Laterality: N/A;  . ESOPHAGOGASTRODUODENOSCOPY N/A 02/21/2016   Procedure: ESOPHAGOGASTRODUODENOSCOPY (EGD);  Surgeon: Mauri Pole, MD;  Location: [facility] ENDOSCOPY;  Service: Endoscopy;  Laterality: N/A;  . ESOPHAGOGASTRODUODENOSCOPY N/A 04/21/2016   Procedure: ESOPHAGOGASTRODUODENOSCOPY (EGD);  Surgeon: Gatha Mayer, MD;  Location: [facility] ENDOSCOPY;  Service: Endoscopy;  Laterality: N/A;  . ESOPHAGOGASTRODUODENOSCOPY N/A 05/06/2016   Procedure: ESOPHAGOGASTRODUODENOSCOPY (EGD);  Surgeon: Jonathon Bellows, MD;  Location: [facility] ENDOSCOPY;  Service: Gastroenterology;  Laterality: N/A;  . ESOPHAGOGASTRODUODENOSCOPY N/A 06/13/2016   Procedure: ESOPHAGOGASTRODUODENOSCOPY (EGD);  Surgeon: [name], MD;  Location[facility] ENDOSCOPY;  Service: Endoscopy;  Laterality: N/A;  . ESOPHAGOGASTRODUODENOSCOPY (EGD) WITH PROPOFOL N/A 02/29/2016   Procedure: ESOPHAGOGASTRODUODENOSCOPY (EGD) WITH PROPOFOL;  Surgeon: [name], MD;  Location: [facility] ENDOSCOPY;  Service: Endoscopy;  Laterality: N/A;  . LAPAROTOMY N/A 09/12/2015   Procedure: EXPLORATORY LAPAROTOMY;  Surgeon: Florene Glen, MD;  Location: [facility] ORS;   Service: General;  Laterality: N/A;  . SIGMOIDOSCOPY N/A 09/12/2015   Procedure: Lonell Face;  Surgeon: Florene Glen, MD;  Location: [facility] ORS;  Service: General;  Laterality: N/A;  . WISDOM TOOTH EXTRACTION     Family History:  Family History  Problem Relation Age of Onset  . Depression Mother   . Hypertension Mother   . Sleep apnea Mother   . Asthma Mother   . COPD Mother   . Diabetes Mother   . Breast cancer Maternal Aunt        20's   Family Psychiatric  History: See above list Social History:  Social History   Substance and Sexual Activity  Alcohol Use No     Social History   Substance and Sexual Activity  Drug Use No    Social History   Socioeconomic History  . Marital status: Single    Spouse name: Not on file  . Number of children: Not on file  . Years of education: Not on file  . Highest education level: Not on file  Occupational History  . Not on file  Social Needs  . Financial resource strain: Not on file  . Food insecurity:    Worry: Not on file    Inability: Not on file  . Transportation needs:    Medical: Not on file    Non-medical: Not on file  Tobacco Use  . Smoking status: Former Smoker    Packs/day: 0.50    Years: 3.00    Pack years: 1.50    Types: Cigarettes    Last attempt to quit: 08/2016    Years since quitting: 1.4  . Smokeless tobacco: Never Used  Substance and Sexual Activity  . Alcohol use: No  . Drug use: No  . Sexual activity: Never    Birth control/protection: Injection  Lifestyle  .  Physical activity:    Days per week: Not on file    Minutes per session: Not on file  . Stress: Not on file  Relationships  . Social connections:    Talks on phone: Not on file    Gets together: Not on file    Attends religious service: Not on file    Active member of club or organization: Not on file    Attends meetings of clubs or organizations: Not on file    Relationship status: Not on file  Other Topics Concern  . Not on file   Social History Narrative  . Not on file   Additional Social History:    Allergies:   Allergies  Allergen Reactions  . Betadine [Povidone Iodine] Other (See Comments)    Reaction:  Unknown   . Iodine Rash  . Shellfish-Derived Products Other (See Comments)    Reaction:  Unknown     Labs:  Results for orders placed or performed during the hospital encounter of 01/14/18 (from the past 48 hour(s))  CBC with Differential/Platelet     Status: None   Collection Time: 01/14/18 10:31 PM  Result Value Ref Range   WBC 8.4 4.0 - 10.5 K/uL   RBC 3.96 3.87 - 5.11 MIL/uL   Hemoglobin 12.0 12.0 - 15.0 g/dL   HCT 36.4 36.0 - 46.0 %   MCV 91.9 78.0 - 100.0 fL   MCH 30.3 26.0 - 34.0 pg   MCHC 33.0 30.0 - 36.0 g/dL   RDW 13.2 11.5 - 15.5 %   Platelets 225 150 - 400 K/uL   Neutrophils Relative % 66 %   Neutro Abs 5.5 1.7 - 7.7 K/uL   Lymphocytes Relative 25 %   Lymphs Abs 2.1 0.7 - 4.0 K/uL   Monocytes Relative 7 %   Monocytes Absolute 0.6 0.1 - 1.0 K/uL   Eosinophils Relative 2 %   Eosinophils Absolute 0.2 0.0 - 0.7 K/uL   Basophils Relative 0 %   Basophils Absolute 0.0 0.0 - 0.1 K/uL    Comment: Performed at [facility], [address], [place], [place] [address]  Comprehensive metabolic panel     Status: Abnormal   Collection Time: 01/14/18 10:31 PM  Result Value Ref Range   Sodium 139 135 - 145 mmol/L   Potassium 3.9 3.5 - 5.1 mmol/L   Chloride 108 98 - 111 mmol/L   CO2 25 22 - 32 mmol/L   Glucose, Bld 88 70 - 99 mg/dL   BUN 10 6 - 20 mg/dL   Creatinine, Ser 0.62 0.44 - 1.00 mg/dL   Calcium 8.8 (L) 8.9 - 10.3 mg/dL   Total Protein 6.4 (L) 6.5 - 8.1 g/dL   Albumin 3.7 3.5 - 5.0 g/dL   AST 16 15 - 41 U/L   ALT 7 0 - 44 U/L   Alkaline Phosphatase 65 38 - 126 U/L   Total Bilirubin 0.6 0.3 - 1.2 mg/dL   GFR calc non Af Amer >60 >60 mL/min   GFR calc Af Amer >60 >60 mL/min    Comment: (NOTE) The eGFR has been calculated using the CKD EPI equation. This calculation has not been  validated in all clinical situations. eGFR's persistently <60 mL/min signify possible Chronic Kidney Disease.    Anion gap 6 5 - 15    Comment: Performed at [facility], [address], [place], [place] [address]  Ethanol     Status: None   Collection Time: 01/14/18 10:31 PM  Result  Value Ref Range   Alcohol, Ethyl (B) <10 <10 mg/dL    Comment: (NOTE) Lowest detectable limit for serum alcohol is 10 mg/dL. For medical purposes only. Performed at [facility], [address], [place], [place] [address]   hCG, serum, qualitative     Status: None   Collection Time: 01/14/18 10:31 PM  Result Value Ref Range   Preg, Serum NEGATIVE NEGATIVE    Comment:        THE SENSITIVITY OF THIS METHODOLOGY IS >10 mIU/mL. Performed at [facility], [address], [place], [place] [address]   Salicylate level     Status: None   Collection Time: 01/14/18 10:31 PM  Result Value Ref Range   Salicylate Lvl <2.7 2.8 - 30.0 mg/dL    Comment: Performed at [facility], [address], [place], [place] [address]  Acetaminophen level     Status: Abnormal   Collection Time: 01/14/18 10:31 PM  Result Value Ref Range   Acetaminophen (Tylenol), Serum <10 (L) 10 - 30 ug/mL    Comment: (NOTE) Therapeutic concentrations vary significantly. A range of 10-30 ug/mL  may be an effective concentration for many patients. However, some  are best treated at concentrations outside of this range. Acetaminophen concentrations >150 ug/mL at 4 hours after ingestion  and >50 ug/mL at 12 hours after ingestion are often associated with  toxic reactions. Performed at [facility], [address], [place], [place] [address]   Lithium level     Status: Abnormal   Collection Time: 01/14/18 10:31 PM  Result Value Ref Range   Lithium Lvl 0.52 (L) 0.60 - 1.20 mmol/L    Comment: Performed at [facility], [address] [place], [place] [address]  Rapid urine drug screen (hospital performed)     Status: None   Collection Time:  01/14/18 11:00 PM  Result Value Ref Range   Opiates NONE DETECTED NONE DETECTED   Cocaine NONE DETECTED NONE DETECTED   Benzodiazepines NONE DETECTED NONE DETECTED   Amphetamines NONE DETECTED NONE DETECTED   Tetrahydrocannabinol NONE DETECTED NONE DETECTED   Barbiturates NONE DETECTED NONE DETECTED    Comment: (NOTE) DRUG SCREEN FOR MEDICAL PURPOSES ONLY.  IF CONFIRMATION IS NEEDED FOR ANY PURPOSE, NOTIFY LAB WITHIN 5 DAYS. LOWEST DETECTABLE LIMITS FOR URINE DRUG SCREEN Drug Class                     Cutoff (ng/mL) Amphetamine and metabolites    1000 Barbiturate and metabolites    200 Benzodiazepine                 607 Tricyclics and metabolites     300 Opiates and metabolites        300 Cocaine and metabolites        300 THC                            50 Performed at [facility], [address] [place], [place] [address]     Medications:  Current Facility-Administered Medications  Medication Dose Route Frequency Provider Last Rate Last Dose  . amLODipine (NORVASC) tablet 2.5 mg  2.5 mg Oral Daily Rancour, Stephen, MD   2.5 mg at 01/16/18 0914  . cloZAPine (CLOZARIL) tablet 175 mg  175 mg Oral Loreta Ave, MD   175 mg at 01/15/18 2301  . hydrOXYzine (ATARAX/VISTARIL) tablet 50 mg  50 mg Oral BID PRN Ezequiel Essex, MD      .  levothyroxine (SYNTHROID, LEVOTHROID) tablet 100 mcg  100 mcg Oral QAC breakfast Rancour, Annie Main, MD   100 mcg at 01/16/18 0917  . lithium carbonate capsule 300 mg  300 mg Oral BID WC Rancour, Annie Main, MD   300 mg at 01/16/18 0916  . loratadine (CLARITIN) tablet 10 mg  10 mg Oral Daily Rancour, Stephen, MD   10 mg at 01/16/18 0916  . Oxcarbazepine (TRILEPTAL) tablet 300 mg  300 mg Oral BID Rancour, Annie Main, MD   300 mg at 01/16/18 1021  . pantoprazole (PROTONIX) EC tablet 40 mg  40 mg Oral Daily Rancour, Stephen, MD   40 mg at 01/16/18 0915  . prazosin (MINIPRESS) capsule 2 mg  2 mg Oral QHS Rancour, Stephen, MD   2 mg at 01/15/18 2300  .  senna (SENOKOT) tablet 8.6 mg  1 tablet Oral Daily PRN Rancour, Stephen, MD      . traZODone (DESYREL) tablet 50 mg  50 mg Oral QHS Rancour, Annie Main, MD   50 mg at 01/15/18 2301  . venlafaxine XR (EFFEXOR-XR) 24 hr capsule 150 mg  150 mg Oral Q breakfast Rancour, Stephen, MD   150 mg at 01/16/18 0915   Current Outpatient Medications  Medication Sig Dispense Refill  . amLODipine (NORVASC) 2.5 MG tablet Take 2.5 mg by mouth daily.    . cloZAPine (CLOZARIL) 25 MG tablet Take 7 tablets (175 mg total) by mouth at bedtime. 210 tablet 0  . docusate sodium (COLACE) 100 MG capsule Take 1 capsule (100 mg total) by mouth daily as needed. (Patient taking differently: Take 100 mg by mouth daily as needed for mild constipation or moderate constipation. ) 30 capsule 0  . hydrOXYzine (ATARAX/VISTARIL) 50 MG tablet Take 1 tablet (50 mg total) by mouth 2 (two) times daily as needed. 90 tablet 1  . levothyroxine (SYNTHROID, LEVOTHROID) 100 MCG tablet Take 1 tablet (100 mcg total) by mouth daily before breakfast. 30 tablet 1  . lithium carbonate 300 MG capsule Take 1 capsule (300 mg total) by mouth 2 (two) times daily with a meal. 60 capsule 1  . medroxyPROGESTERone (DEPO-PROVERA) 150 MG/ML injection Inject 1 mL (150 mg total) into the muscle every 3 (three) months. 1 mL 1  . omeprazole (PRILOSEC) 20 MG capsule Take 1 capsule (20 mg total) by mouth daily. 30 capsule 1  . OXcarbazepine (TRILEPTAL) 300 MG tablet Take 1 tablet (300 mg total) by mouth 2 (two) times daily. 60 tablet 1  . polyethylene glycol (MIRALAX / GLYCOLAX) packet Take 17 g by mouth daily as needed. 30 each 1  . prazosin (MINIPRESS) 2 MG capsule Take 1 capsule (2 mg total) by mouth at bedtime. 30 capsule 0  . traZODone (DESYREL) 50 MG tablet Take 1 tablet (50 mg total) by mouth at bedtime. 30 tablet 1  . venlafaxine XR (EFFEXOR-XR) 150 MG 24 hr capsule Take 1 capsule (150 mg total) by mouth daily with breakfast. 30 capsule 1  . Vitamin D,  Ergocalciferol, (DRISDOL) 50000 units CAPS capsule Take 1 capsule (50,000 Units total) by mouth every 7 (seven) days. 4 capsule 1  . cetirizine (ZYRTEC) 10 MG tablet Take 1 tablet (10 mg total) by mouth daily as needed for allergies. 30 tablet 1  . senna (SENOKOT) 8.6 MG TABS tablet Take 1 tablet (8.6 mg total) by mouth daily as needed for mild constipation. 120 each 0    Musculoskeletal: Strength & Muscle Tone: within normal limits Gait & Station: normal Patient leans: NA  Psychiatric  Specialty Exam: Physical Exam  ROS  Blood pressure 117/69, pulse 72, temperature 98.7 F (37.1 C), temperature source Oral, resp. rate 18, height 5' 6"  (1.676 m), weight 99.8 kg, last menstrual period 01/02/2018, SpO2 100 %.Body mass index is 35.51 kg/m.  General Appearance: Casual  Eye Contact:  Good  Speech:  Clear and Coherent and Normal Rate  Volume:  Normal  Mood:  Depressed  Affect:  Depressed and Flat  Thought Process:  Goal Directed  Orientation:  Full (Time, Place, and Person)  Thought Content:  Hallucinations: Auditory Command:  Voices telling her to kill herself  Suicidal Thoughts:  Yes.  with intent/plan  Homicidal Thoughts:  No  Memory:  Immediate;   Good Recent;   Good Remote;   Good  Judgement:  Impaired  Insight:  Lacking  Psychomotor Activity:  Decreased  Concentration:  Concentration: Good and Attention Span: Good  Recall:  Good  Fund of Knowledge:  Good  Language:  Good  Akathisia:  No  Handed:  Right  AIMS (if indicated):     Assets:  Communication Skills Desire for Improvement Housing  ADL's:  Intact  Cognition:  WNL  Sleep:        Treatment Plan Summary: Plan Inpatient psychiatric treatment  Disposition: Recommend psychiatric Inpatient admission when medically cleared.  This service was provided via telemedicine using a 2-way, interactive audio and video technology.  Names of all persons participating in this telemedicine service and their role in this  encounter. Name: Earleen Newport, NP Role: Tele psych assessment  Name: Dr. Dwyane Dee Role: Psychiatrist  Name: [name] Role: Patient  Name:  Role:     Earleen Newport, NP 01/16/2018 2:26 PM

## 2018-01-16 NOTE — ED Notes (Signed)
Pt still endorses SI with auditory hallucinations.

## 2018-01-16 NOTE — ED Notes (Signed)
Room now secured with zip ties and locked drawers. EVS called to take sharps box out of the room. Pt's breakfast tray removed at this time.

## 2018-01-16 NOTE — ED Notes (Signed)
Pt given finger food tray at this time.

## 2018-01-16 NOTE — ED Notes (Signed)
Pt provided with shower at this time.

## 2018-01-16 NOTE — ED Notes (Signed)
Pt requesting to take a shower today, notified pt shower would be available after dinner d/t staffing. Understanding from pt.

## 2018-01-17 ENCOUNTER — Ambulatory Visit: Payer: Medicare Other | Admitting: Obstetrics and Gynecology

## 2018-01-17 DIAGNOSIS — T182XXA Foreign body in stomach, initial encounter: Secondary | ICD-10-CM | POA: Diagnosis not present

## 2018-01-17 NOTE — ED Notes (Signed)
Pt escorted to the shower by this sitter °

## 2018-01-17 NOTE — BH Assessment (Signed)
[facility] Assessment Progress Note  Pt reassessed this AM. Pt continues to reports SI and command hallucinations. Pt reports not feeling any better. IP treatment continues to be recommended.   Kenna Gilbert. Lovena Le, [place], [place], [facility] Counselor

## 2018-01-17 NOTE — ED Notes (Signed)
Pt given lunch tray.

## 2018-01-17 NOTE — ED Notes (Signed)
Have given pt a meal tray  

## 2018-01-17 NOTE — ED Notes (Signed)
Patient alert at this time and eating crackers. Patient given soda and bathroom privileges.

## 2018-01-17 NOTE — ED Notes (Signed)
Updated pt, re evaluation by [facility] will be soon.

## 2018-01-18 ENCOUNTER — Encounter (HOSPITAL_COMMUNITY): Payer: Self-pay | Admitting: Registered Nurse

## 2018-01-18 DIAGNOSIS — T182XXA Foreign body in stomach, initial encounter: Secondary | ICD-10-CM | POA: Diagnosis not present

## 2018-01-18 NOTE — Progress Notes (Signed)
Pt. meets criteria for inpatient treatment per Earleen Newport, NP.  Referred out to the following hospitals:  [facility]  [facility]  [facility]     Disposition CSW will continue to follow for placement.  Areatha Keas. Judi Cong, MSW, [place] Disposition Clinical Social Work [phone] (cell) [phone] (office)

## 2018-01-18 NOTE — ED Notes (Addendum)
Pt. walked around nurses station with this sitter

## 2018-01-18 NOTE — ED Notes (Signed)
Pt. Walked around the nursing station twice with this sitter.

## 2018-01-18 NOTE — ED Notes (Signed)
Patient offered a shower by technician, patient refused stating "in a minute".  Patient advised to walk around unit for exercise.

## 2018-01-18 NOTE — Progress Notes (Addendum)
CSW received phone call from admissions staff at [facility] stating that after checking pt's insurance again, it was noticed that she has [employer] as a secondary benefit, and that [facility] will not be able to accept her as they are not contracted with that insurance plan.   CSW notified Hassan Rowan @ [facility]  CSW will continue to assist with placement needs.   Audree Camel, LCSW, [place] Disposition CSW [facility] [phone]  Update 10:20pm: CSW received phone call from [facility]. They have declined pt due to "chronicity".
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
## 2018-01-18 NOTE — Progress Notes (Signed)
Pt accepted to [facility]. Dr. Oralia Rud is the accepting/attending provider Call report to [phone] ex. [place] @ [facility] notified.  Pt is voluntary and can be transported by [facility].  Pt is scheduled to arrive at [facility] on 01/19/18 at [place], [place], [place] Disposition CSW [facility] [phone]

## 2018-01-19 DIAGNOSIS — T182XXA Foreign body in stomach, initial encounter: Secondary | ICD-10-CM | POA: Diagnosis not present

## 2018-01-19 NOTE — Progress Notes (Signed)
Stacy from [place] called to give an auth # [id number] for [facility] referral.  Lind Covert, MSW, LCSW Therapeutic Triage Specialist  [phone]

## 2018-01-19 NOTE — ED Notes (Signed)
Pt meds provided  PT reports pretty good day "slept thru most of it"  Mouth checked after meds to insure that pt did no cheek meds

## 2018-01-19 NOTE — Progress Notes (Signed)
CSW completed/faxed pt's state hospital referral packet to [facility] for review.   Audree Camel, LCSW, [place] Disposition [facility] BHH/TTS [phone]

## 2018-01-19 NOTE — ED Provider Notes (Signed)
Patient resting comfortably overnight. BP 113/65 (BP Location: Left Arm)   Pulse 73   Temp 98.5 F (36.9 C) (Oral)   Resp 15   Ht 1.676 m (5\' 6" )   Wt 99.8 kg   LMP 01/02/2018   SpO2 100%   BMI 35.51 kg/m  Per psych notes, she is still pending inpatient admission.  She has already had endoscopy to remove AAA battery Patient awaiting admission   Ripley Fraise, MD 01/19/18 [phone]

## 2018-01-19 NOTE — BH Assessment (Signed)
[facility] Assessment Progress Note   Per TTS reassessment:  Pt tells this writer she slept all day and she is still feeling the same. TTS asked the pt for clarity and she states she is still hearing voices that tell her she is no good and saying "my life is not worth living". Pt denies HI. Pt states she was able to eat breakfast, some lunch, and some of her dinner. TTS advised the pt we are still seeking inpt treatment due to ongoing SI and inability to contract for safety per Patriciaann Clan, PA.  Lind Covert, MSW, LCSW Therapeutic Triage Specialist  [phone]

## 2018-01-19 NOTE — ED Notes (Signed)
Pt continues to wait for placement

## 2018-01-19 NOTE — ED Notes (Signed)
Will adm. meds once pt is awake.

## 2018-01-19 NOTE — ED Notes (Signed)
[facility] continues to seek placement. Spoke with J sutter. Pt to be IVCed in order to be placed on [facility] waiting list. Dr Wilson Singer to fill out IVC paperwork

## 2018-01-20 DIAGNOSIS — T182XXA Foreign body in stomach, initial encounter: Secondary | ICD-10-CM | POA: Diagnosis not present

## 2018-01-20 NOTE — BHH Counselor (Signed)
Re-assessment:   Patient report during re-assessment she's not feeling any better, report she continues to hear voices and continues to feel suicidal. Report the voices are telling her it's her fault what happened to her when she was little. Denies suicidal plan. Denies HI, and denies visual hallucinations.   Disposition:  Marvia Pickles, NP, continues to recommend patient meeting inpatient criteria.

## 2018-01-20 NOTE — Progress Notes (Signed)
Patient meets criteria for inpatient treatment. CSW faxed referrals to the following facilities for review. [place], [facility], [place], [place], [place], [place], [place], [place], [place], [place], [place], [place], [place], [place], [place], [place], [place], [place], [place], [place], [place], [place], [place], and [place].   LCSW is also sending referral to [facility].   TTS will continue to seek bed placement.     Herbert Moors, MSW, LCSW, LCAS 01/20/2018 10:41 AM

## 2018-01-20 NOTE — ED Notes (Signed)
Pt has been accepted at [facility] and can arrive tomorrow 01/21/18 at noon.  Accepting is Jonelle Sports MD. Report [phone].  Coming to main campus.

## 2018-01-21 DIAGNOSIS — T182XXA Foreign body in stomach, initial encounter: Secondary | ICD-10-CM | POA: Diagnosis not present

## 2018-01-21 NOTE — Progress Notes (Signed)
Patient has been accepted to [facility]. LCSW called legal guardian [name]) but her voicemail came on. LCSW then called the patient's group home [facility]) and spoke with [place] [phone]) who reports that she will notify the legal guardian of patient's placement at [facility].   Herbert Moors MSW, LCSW, LCAS Clinical Social Worker 01/21/2018 7:56 AM

## 2018-01-21 NOTE — ED Provider Notes (Signed)
Pt accepted to [facility]. Will transfer stable.    Francine Graven, DO 01/21/18 1131

## 2018-01-24 ENCOUNTER — Encounter (HOSPITAL_COMMUNITY): Payer: Self-pay | Admitting: Internal Medicine

## 2018-01-31 ENCOUNTER — Emergency Department
Admission: EM | Admit: 2018-01-31 | Discharge: 2018-02-02 | Disposition: A | Discharge status: Other Acute Inpatient Hospital | Payer: Medicare Other | Attending: Emergency Medicine | Admitting: Emergency Medicine

## 2018-01-31 ENCOUNTER — Other Ambulatory Visit: Payer: Self-pay

## 2018-01-31 DIAGNOSIS — Z79899 Other long term (current) drug therapy: Secondary | ICD-10-CM | POA: Insufficient documentation

## 2018-01-31 DIAGNOSIS — F250 Schizoaffective disorder, bipolar type: Secondary | ICD-10-CM

## 2018-01-31 DIAGNOSIS — F251 Schizoaffective disorder, depressive type: Secondary | ICD-10-CM | POA: Diagnosis not present

## 2018-01-31 DIAGNOSIS — Z87891 Personal history of nicotine dependence: Secondary | ICD-10-CM | POA: Insufficient documentation

## 2018-01-31 DIAGNOSIS — F603 Borderline personality disorder: Secondary | ICD-10-CM | POA: Diagnosis present

## 2018-01-31 DIAGNOSIS — F4310 Post-traumatic stress disorder, unspecified: Secondary | ICD-10-CM | POA: Diagnosis present

## 2018-01-31 DIAGNOSIS — E039 Hypothyroidism, unspecified: Secondary | ICD-10-CM | POA: Insufficient documentation

## 2018-01-31 DIAGNOSIS — J45909 Unspecified asthma, uncomplicated: Secondary | ICD-10-CM | POA: Insufficient documentation

## 2018-01-31 DIAGNOSIS — R45851 Suicidal ideations: Secondary | ICD-10-CM

## 2018-01-31 DIAGNOSIS — I10 Essential (primary) hypertension: Secondary | ICD-10-CM | POA: Insufficient documentation

## 2018-01-31 DIAGNOSIS — Z008 Encounter for other general examination: Secondary | ICD-10-CM | POA: Insufficient documentation

## 2018-01-31 DIAGNOSIS — F29 Unspecified psychosis not due to a substance or known physiological condition: Secondary | ICD-10-CM

## 2018-01-31 DIAGNOSIS — F259 Schizoaffective disorder, unspecified: Secondary | ICD-10-CM | POA: Insufficient documentation

## 2018-01-31 LAB — CBC
HEMATOCRIT: 38.2 % (ref 35.0–47.0)
HEMOGLOBIN: 12.9 g/dL (ref 12.0–16.0)
MCH: 31.3 pg (ref 26.0–34.0)
MCHC: 33.9 g/dL (ref 32.0–36.0)
MCV: 92.5 fL (ref 80.0–100.0)
Platelets: 238 10*3/uL (ref 150–440)
RBC: 4.13 MIL/uL (ref 3.80–5.20)
RDW: 14.4 % (ref 11.5–14.5)
WBC: 9.2 10*3/uL (ref 3.6–11.0)

## 2018-01-31 LAB — COMPREHENSIVE METABOLIC PANEL
ALK PHOS: 77 U/L (ref 38–126)
ALT: 10 U/L (ref 0–44)
AST: 18 U/L (ref 15–41)
Albumin: 4.2 g/dL (ref 3.5–5.0)
Anion gap: 6 (ref 5–15)
BUN: 8 mg/dL (ref 6–20)
CALCIUM: 9.4 mg/dL (ref 8.9–10.3)
CO2: 26 mmol/L (ref 22–32)
CREATININE: 0.63 mg/dL (ref 0.44–1.00)
Chloride: 108 mmol/L (ref 98–111)
GFR calc non Af Amer: >60 mL/min (ref >60)
GLUCOSE: 96 mg/dL (ref 70–99)
Potassium: 4 mmol/L (ref 3.5–5.1)
Sodium: 140 mmol/L (ref 135–145)
Total Bilirubin: 0.5 mg/dL (ref 0.3–1.2)
Total Protein: 6.9 g/dL (ref 6.5–8.1)

## 2018-01-31 LAB — URINE DRUG SCREEN, QUALITATIVE (ARMC ONLY)
Amphetamines, Ur Screen: NOT DETECTED
Barbiturates, Ur Screen: NOT DETECTED
Benzodiazepine, Ur Scrn: NOT DETECTED
CANNABINOID 50 NG, UR ~~LOC~~: NOT DETECTED
COCAINE METABOLITE, UR ~~LOC~~: NOT DETECTED
MDMA (ECSTASY) UR SCREEN: NOT DETECTED
METHADONE SCREEN, URINE: NOT DETECTED
OPIATE, UR SCREEN: NOT DETECTED
Phencyclidine (PCP) Ur S: NOT DETECTED
TRICYCLIC, UR SCREEN: NOT DETECTED

## 2018-01-31 LAB — POCT PREGNANCY, URINE: Preg Test, Ur: NEGATIVE

## 2018-01-31 LAB — DIFFERENTIAL
Basophils Absolute: 0.1 10*3/uL (ref 0–0.1)
Basophils Relative: 1 %
EOS PCT: 1 %
Eosinophils Absolute: 0.1 10*3/uL (ref 0–0.7)
LYMPHS ABS: 1.9 10*3/uL (ref 1.0–3.6)
Lymphocytes Relative: 21 %
MONO ABS: 0.5 10*3/uL (ref 0.2–0.9)
MONOS PCT: 5 %
NEUTROS ABS: 6.8 10*3/uL — AB (ref 1.4–6.5)
Neutrophils Relative %: 72 %

## 2018-01-31 LAB — SALICYLATE LEVEL

## 2018-01-31 LAB — ACETAMINOPHEN LEVEL: Acetaminophen (Tylenol), Serum: <10 ug/mL — ABNORMAL LOW (ref 10–30)

## 2018-01-31 LAB — ETHANOL: Alcohol, Ethyl (B): <10 mg/dL (ref <10)

## 2018-01-31 MED ORDER — PRAZOSIN HCL 2 MG PO CAPS
2.0000 mg | ORAL_CAPSULE | Freq: Every day | ORAL | Status: DC
  Administered 2018-01-31 – 2018-02-01 (×2): 2 mg via ORAL
  Filled 2018-01-31 (×2): qty 1
  Filled 2018-01-31: qty 2

## 2018-01-31 MED ORDER — VENLAFAXINE HCL ER 75 MG PO CP24
150.0000 mg | ORAL_CAPSULE | Freq: Every day | ORAL | Status: DC
  Administered 2018-02-01 – 2018-02-02 (×2): 150 mg via ORAL
  Filled 2018-01-31 (×2): qty 2

## 2018-01-31 MED ORDER — OXCARBAZEPINE 300 MG PO TABS
300.0000 mg | ORAL_TABLET | Freq: Two times a day (BID) | ORAL | Status: DC
  Administered 2018-01-31 – 2018-02-02 (×4): 300 mg via ORAL
  Filled 2018-01-31 (×6): qty 1

## 2018-01-31 MED ORDER — AMLODIPINE BESYLATE 5 MG PO TABS
2.5000 mg | ORAL_TABLET | Freq: Every day | ORAL | Status: DC
  Administered 2018-02-01 – 2018-02-02 (×2): 2.5 mg via ORAL
  Filled 2018-01-31 (×2): qty 1

## 2018-01-31 MED ORDER — PANTOPRAZOLE SODIUM 40 MG PO TBEC
40.0000 mg | DELAYED_RELEASE_TABLET | Freq: Every day | ORAL | Status: DC
  Administered 2018-02-01 – 2018-02-02 (×2): 40 mg via ORAL
  Filled 2018-01-31 (×2): qty 1

## 2018-01-31 MED ORDER — LORATADINE 10 MG PO TABS
10.0000 mg | ORAL_TABLET | Freq: Every day | ORAL | Status: DC
  Administered 2018-02-01 – 2018-02-02 (×2): 10 mg via ORAL
  Filled 2018-01-31 (×2): qty 1

## 2018-01-31 MED ORDER — LEVOTHYROXINE SODIUM 75 MCG PO TABS
100.0000 ug | ORAL_TABLET | Freq: Every day | ORAL | Status: DC
  Administered 2018-02-01 – 2018-02-02 (×2): 100 ug via ORAL
  Filled 2018-01-31 (×2): qty 1

## 2018-01-31 MED ORDER — CLOZAPINE 100 MG PO TABS
200.0000 mg | ORAL_TABLET | Freq: Every day | ORAL | Status: DC
  Administered 2018-02-01 (×2): 200 mg via ORAL
  Filled 2018-01-31 (×2): qty 2

## 2018-01-31 MED ORDER — DOCUSATE SODIUM 100 MG PO CAPS
100.0000 mg | ORAL_CAPSULE | Freq: Two times a day (BID) | ORAL | Status: DC
  Administered 2018-01-31 – 2018-02-02 (×4): 100 mg via ORAL
  Filled 2018-01-31 (×6): qty 1

## 2018-01-31 MED ORDER — LITHIUM CARBONATE 300 MG PO CAPS
300.0000 mg | ORAL_CAPSULE | Freq: Two times a day (BID) | ORAL | Status: DC
  Administered 2018-02-01 – 2018-02-02 (×3): 300 mg via ORAL
  Filled 2018-01-31 (×3): qty 1

## 2018-01-31 MED ORDER — TRAZODONE HCL 100 MG PO TABS
100.0000 mg | ORAL_TABLET | Freq: Every day | ORAL | Status: DC
  Administered 2018-01-31 – 2018-02-01 (×2): 100 mg via ORAL
  Filled 2018-01-31 (×2): qty 1

## 2018-01-31 NOTE — ED Provider Notes (Signed)
[facility] Emergency Department Provider Note  ____________________________________________   First MD Initiated Contact with Patient 01/31/18 1710     (approximate)  I have reviewed the triage vital signs and the nursing notes.   HISTORY  Chief Complaint Suicidal   HPI Melanie Bell is a 27 y.o. female with a history of depression as well as schizoaffective disorder who is presenting to the emergency department with hallucinations as well as suicidal ideation.  She says that she is hearing voices that tell her to kill herself.  She had a plan to swallow a nail clipper and a battery but she says that she did not.  She denies any foreign body ingestion or placing a foreign body in any orifice today.  She was just discharged from [facility] yesterday after ingesting a AAA battery which was removed via endoscopy.  She says that her only pain at this time is cramping to her lower abdomen because "I am on my cycle."  She says that the pain is typical for her.   Past Medical History:  Diagnosis Date  . Anxiety   . Asthma   . Depression   . GERD (gastroesophageal reflux disease)   . Hallucinations 09/30/2014   Sizoaffective  . Hyperlipidemia   . Hypertension   . Intentional ingestion of batteries 04/21/2016    2 AAA batteries and 1 thumb tack; intent to hurt herself/notes 04/21/2016  . Tardive dyskinesia 10/2014   recent onset    Patient Active Problem List   Diagnosis Date Noted  . Suicide attempt ([place]) 01/16/2018  . Dysfunctional uterine bleeding 01/06/2018  . Borderline personality disorder ([place]) 01/04/2018  . PTSD (post-traumatic stress disorder) 01/03/2018  . Suicidal ideation 01/02/2018  . Self-inflicted injury 20/94/7096  . ASCUS of cervix with negative high risk HPV 08/28/2017  . Malingering 05/06/2016  . Foreign body aspiration   . Other foreign object in esophagus causing other injury, subsequent encounter   . Swallowed foreign body  04/23/2016  . Foreign body ingestion 04/21/2016  . Gastric foreign body   . Breast tumor 03/05/2016  . Schizoaffective disorder, bipolar type ([place]) 11/06/2014  . Tobacco use disorder 09/30/2014  . Hypothyroidism 09/29/2014  . Hypertension 09/29/2014    Past Surgical History:  Procedure Laterality Date  . ABDOMINAL SURGERY     "years ago" to remove foreign objects  . APPENDECTOMY    . BREAST EXCISIONAL BIOPSY Right 09/03/2007   surgical bx removed fibroadeoma  . BREAST LUMPECTOMY Right   . COLONOSCOPY WITH PROPOFOL N/A 09/10/2015   Procedure: COLONOSCOPY WITH PROPOFOL;  Surgeon: [name], MD;  Location: [facility] ENDOSCOPY;  Service: Endoscopy;  Laterality: N/A;  . ESOPHAGOGASTRODUODENOSCOPY N/A 11/28/2014   Procedure: ESOPHAGOGASTRODUODENOSCOPY (EGD);  Surgeon: [name], MD;  Location: [facility] ENDOSCOPY;  Service: Endoscopy;  Laterality: N/A;  . ESOPHAGOGASTRODUODENOSCOPY N/A 02/21/2016   Procedure: ESOPHAGOGASTRODUODENOSCOPY (EGD);  Surgeon: Mauri Pole, MD;  Location: [facility] ENDOSCOPY;  Service: Endoscopy;  Laterality: N/A;  . ESOPHAGOGASTRODUODENOSCOPY N/A 04/21/2016   Procedure: ESOPHAGOGASTRODUODENOSCOPY (EGD);  Surgeon: Gatha Mayer, MD;  [facility] ENDOSCOPY;  Service: Endoscopy;  Laterality: N/A;  . ESOPHAGOGASTRODUODENOSCOPY N/A 05/06/2016   Procedure: ESOPHAGOGASTRODUODENOSCOPY (EGD);  Surgeon: Jonathon Bellows, MD;  Location: [facility] ENDOSCOPY;  Service: Gastroenterology;  Laterality: N/A;  . ESOPHAGOGASTRODUODENOSCOPY N/A 06/13/2016   Procedure: ESOPHAGOGASTRODUODENOSCOPY (EGD);  Surgeon: [name], MD;  Location: [facility] ENDOSCOPY;  Service: Endoscopy;  Laterality: N/A;  . ESOPHAGOGASTRODUODENOSCOPY (EGD) WITH PROPOFOL N/A 02/29/2016   Procedure: ESOPHAGOGASTRODUODENOSCOPY (EGD)  WITH PROPOFOL;  Surgeon: [name], MD;  Location: [facility] ENDOSCOPY;  Service: Endoscopy;  Laterality: N/A;  . ESOPHAGOGASTRODUODENOSCOPY (EGD) WITH PROPOFOL N/A 01/15/2018   Procedure:  ESOPHAGOGASTRODUODENOSCOPY (EGD) WITH PROPOFOL;  Surgeon: Daneil Dolin, MD;  [facility] ENDO SUITE;  Service: Endoscopy;  Laterality: N/A;  retrieval forein body  . LAPAROTOMY N/A 09/12/2015   Procedure: EXPLORATORY LAPAROTOMY;  Surgeon: Florene Glen, MD;  Location: [facility] ORS;  Service: General;  Laterality: N/A;  . SIGMOIDOSCOPY N/A 09/12/2015   Procedure: Lonell Face;  Surgeon: Florene Glen, MD;  Location: [facility] ORS;  Service: General;  Laterality: N/A;  . WISDOM TOOTH EXTRACTION      Prior to Admission medications   Medication Sig Start Date End Date Taking? Authorizing Provider  amLODipine (NORVASC) 2.5 MG tablet Take 2.5 mg by mouth daily.    [provider]  cetirizine (ZYRTEC) 10 MG tablet Take 1 tablet (10 mg total) by mouth daily as needed for allergies. 12/11/17   [name], Herma Ard B, MD  cloZAPine (CLOZARIL) 25 MG tablet Take 7 tablets (175 mg total) by mouth at bedtime. 01/12/18 02/11/18  McNew, Tyson Babinski, MD  docusate sodium (COLACE) 100 MG capsule Take 1 capsule (100 mg total) by mouth daily as needed. Patient taking differently: Take 100 mg by mouth daily as needed for mild constipation or moderate constipation.  12/16/17 12/16/18  Orbie Pyo, MD  hydrOXYzine (ATARAX/VISTARIL) 50 MG tablet Take 1 tablet (50 mg total) by mouth 2 (two) times daily as needed. 12/11/17   [name], Wardell Honour, MD  levothyroxine (SYNTHROID, LEVOTHROID) 100 MCG tablet Take 1 tablet (100 mcg total) by mouth daily before breakfast. 12/11/17   [name], [name], MD  lithium carbonate 300 MG capsule Take 1 capsule (300 mg total) by mouth 2 (two) times daily with a meal. 12/11/17   [name], [name], MD  medroxyPROGESTERone (DEPO-PROVERA) 150 MG/ML injection Inject 1 mL (150 mg total) into the muscle every 3 (three) months. 12/11/17   [name], [name], MD  omeprazole (PRILOSEC) 20 MG capsule Take 1 capsule (20 mg total) by mouth daily. 12/11/17   [name], [name], MD    OXcarbazepine (TRILEPTAL) 300 MG tablet Take 1 tablet (300 mg total) by mouth 2 (two) times daily. 12/11/17   [name], [name], MD  polyethylene glycol (MIRALAX / GLYCOLAX) packet Take 17 g by mouth daily as needed. 12/11/17   [name], Herma Ard B, MD  prazosin (MINIPRESS) 2 MG capsule Take 1 capsule (2 mg total) by mouth at bedtime. 01/12/18   McNew, Tyson Babinski, MD  senna (SENOKOT) 8.6 MG TABS tablet Take 1 tablet (8.6 mg total) by mouth daily as needed for mild constipation. 12/11/17   [name], Herma Ard B, MD  traZODone (DESYREL) 50 MG tablet Take 1 tablet (50 mg total) by mouth at bedtime. 12/11/17   [name], Herma Ard B, MD  venlafaxine XR (EFFEXOR-XR) 150 MG 24 hr capsule Take 1 capsule (150 mg total) by mouth daily with breakfast. 12/11/17   [name], [name], MD  Vitamin D, Ergocalciferol, (DRISDOL) 50000 units CAPS capsule Take 1 capsule (50,000 Units total) by mouth every 7 (seven) days. 12/11/17   [name], Wardell Honour, MD    Allergies Betadine [povidone iodine]; Iodine; and Shellfish-derived products  Family History  Problem Relation Age of Onset  . Depression Mother   . Hypertension Mother   . Sleep apnea Mother   . Asthma Mother   . COPD Mother   . Diabetes Mother   . Breast  cancer Maternal Aunt        20's    Social History Social History   Tobacco Use  . Smoking status: Former Smoker    Packs/day: 0.50    Years: 3.00    Pack years: 1.50    Types: Cigarettes    Last attempt to quit: 08/2016    Years since quitting: 1.4  . Smokeless tobacco: Never Used  Substance Use Topics  . Alcohol use: No  . Drug use: No    Review of Systems  Constitutional: No fever/chills Eyes: No visual changes. ENT: No sore throat. Cardiovascular: Denies chest pain. Respiratory: Denies shortness of breath. Gastrointestinal:   No nausea, no vomiting.  No diarrhea.  No constipation. Genitourinary: As above Musculoskeletal: Negative for back pain. Skin: Negative for  rash. Neurological: Negative for headaches, focal weakness or numbness.   ____________________________________________   PHYSICAL EXAM:  VITAL SIGNS: ED Triage Vitals [01/31/18 1724]  Enc Vitals Group     BP      Pulse      Resp      Temp      Temp src      SpO2      Weight 200 lb (90.7 kg)     Height 5\' 6"  (1.676 m)     Head Circumference      Peak Flow      Pain Score 0     Pain Loc      Pain Edu?      Excl. in [place]?     Constitutional: Alert and oriented. Well appearing and in no acute distress. Eyes: Conjunctivae are normal.  Head: Atraumatic. Nose: No congestion/rhinnorhea. Mouth/Throat: Mucous membranes are moist.  Neck: No stridor.   Cardiovascular: Normal rate, regular rhythm. Grossly normal heart sounds.   Respiratory: Normal respiratory effort.  No retractions. Lungs CTAB. Gastrointestinal: Soft and nontender. No distention.  Musculoskeletal: No lower extremity tenderness nor edema.  No joint effusions. Neurologic:  Normal speech and language. No gross focal neurologic deficits are appreciated. Skin:  Skin is warm, dry and intact. No rash noted. Psychiatric: Mood and affect are normal. Speech and behavior are normal.  ____________________________________________   LABS (all labs ordered are listed, but only abnormal results are displayed)  Labs Reviewed  COMPREHENSIVE METABOLIC PANEL  ETHANOL  SALICYLATE LEVEL  ACETAMINOPHEN LEVEL  CBC  URINE DRUG SCREEN, QUALITATIVE ([place])  POC URINE PREG, ED   ____________________________________________  EKG   ____________________________________________  RADIOLOGY   ____________________________________________   PROCEDURES  Procedure(s) performed:   Procedures  Critical Care performed:   ____________________________________________   INITIAL IMPRESSION / ASSESSMENT AND PLAN / ED COURSE  Pertinent labs & imaging results that were available during my care of the patient were reviewed by  me and considered in my medical decision making (see chart for details).  DDX: Psychosis, suicidal ideation, auditory hallucinations As part of my medical decision making, I reviewed the following data within the [facility] Notes from prior ED visits  Patient to be placed under involuntary commitment at this time.  Patient calm and cooperative but saying that she is command hallucinations to kill herself.  Multiple ingestions of foreign bodies in the past.  Patient understanding of the diagnosis as well as treatment and willing to comply. ____________________________________________   FINAL CLINICAL IMPRESSION(S) / ED DIAGNOSES  Psychosis.  Suicidal ideation..  NEW MEDICATIONS STARTED DURING THIS VISIT:  New Prescriptions   No medications on file     Note:  This document was prepared using Dragon voice recognition software and may include unintentional dictation errors.     Orbie Pyo, MD 01/31/18 [phone]

## 2018-01-31 NOTE — BH Assessment (Signed)
Writer called pt's group home at [phone]. Received no answer, left hippa compliant voicemail

## 2018-01-31 NOTE — ED Notes (Signed)
meds not being administered at 1830 because not cleared by pharmacy yet

## 2018-01-31 NOTE — Progress Notes (Signed)
Clozapine monitoring  ANC 6.8. Lab submitted and patient eligible with weekly monitoring per [employer] website.  Sim Boast, PharmD, BCPS  01/31/18 10:32 PM
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
## 2018-01-31 NOTE — ED Triage Notes (Signed)
Pt arrived via ems for report of suicidal ideation - pt came from [facility] and has been in [facility] for the last 8 days and discharged yesterday

## 2018-01-31 NOTE — ED Notes (Signed)
Pt dressed out into burgundy paper scrubs, Possessions placed into belongings bag. Belongings include: Orange jumpsuit, blue denim jacket, nude colored bra, white patterned panties, black flats, 2 hair ties, one black one yellow. Other belongings include:  1 pair grey tennis shoes, discharge medication list from [facility], business card for psych services, paper with mothers phone number, black dress pants, clear moisture barrier, shampoo, dove lotion, blue jeans, purple pair of panties, brown dress pants, Red bible, nude bra, white bra, blue t-shirt, floral panties, purple panties, red panties, pink t-shirt, black and white patterned shirt, "life healing choices" hardback book, black composition notebook, 9 adult coloring books, purse which contains:  "stroies behind women of extraordinary faith" hardback book, blue pants, purple shirt, suave shampoo and conditioner, pads, fingernail clippers, noxema face wash, feminine wash, 2 photo albums, dove bar soap.

## 2018-01-31 NOTE — ED Notes (Signed)
Pt. Laying in bed resting at this time.

## 2018-01-31 NOTE — Consult Note (Signed)
[facility] Consult   Reason for Consult: Consult for this 27 year old woman with chronic mental health problems who comes in saying that she is "suicidal" Referring Physician: [place] Patient Identification: Melanie Bell MRN:  [id number] Principal Diagnosis: Schizoaffective disorder, bipolar type [facility]) Diagnosis:   Patient Active Problem List   Diagnosis Date Noted  . Suicide attempt ([place]) [T14.91XA] 01/16/2018  . Dysfunctional uterine bleeding [N93.8] 01/06/2018  . Borderline personality disorder ([place]) [F60.3] 01/04/2018  . PTSD (post-traumatic stress disorder) [F43.10] 01/03/2018  . Suicidal ideation [R45.851] 01/02/2018  . Self-inflicted injury [V42.59] 11/23/2017  . ASCUS of cervix with negative high risk HPV [R87.610] 08/28/2017  . Malingering [Z76.5] 05/06/2016  . Foreign body aspiration [T17.900A]   . Other foreign object in esophagus causing other injury, subsequent encounter [T18.198D]   . Swallowed foreign body [T18.9XXA] 04/23/2016  . Foreign body ingestion [T18.9XXA] 04/21/2016  . Gastric foreign body [T18.2XXA]   . 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 Schizoaffective disorder, bipolar type ([place]) [F25.0] 11/06/2014  . Tobacco use disorder [F17.200] 09/30/2014  . Hypothyroidism [E03.9] 09/29/2014  . Hypertension [I10] 09/29/2014    Total Time spent with patient: 45 minutes  Subjective:   Melanie Bell is a 27 y.o. female patient admitted with "I do not feel safe there".  HPI: Patient interviewed chart reviewed.  Patient well known from multiple prior encounters.  This is a 27 year old woman with long-standing behavioral and mental health problems.  She comes in from her group home saying that she is having "suicidal ideation" and that she is hearing voices.  She says the specific plan she has to hurt herself is to swallow some nail clippers and a battery.  Patient says her mood is anxious and she does not feel comfortable being back at  her group home for a variety of reasons including being afraid of the staff there.  Patient was just discharged from [facility] yesterday.  She had been at [facility] for several days prior to which immediately she had been at the emergency room in [place].  Prior to that it had been only 1 day since she was discharged from our hospital.  Patient had been compliant with medicine throughout the time at [facility].  Social history: Patient's mother, who lives in [place], is her legal guardian.  Patient resides at group homes.  Medical history: Multiple medical problems including hypothyroidism hypertension and multiple self-inflicted injuries and multiple episodes of swallowing metallic objects requiring extraction and surgery.  Substance abuse history: None active  Past Psychiatric History: Patient has a long history of swallowing inanimate objects including paperclips, safety pins, batteries, nail clippers and other pieces of hardware.  She has done it enough times that I think it is obvious to her that this is not an effective suicide attempt behavior but is rather a way to get medical attention and force them to take her to a hospital.  Patient always endorses hallucinations.  Usually does not look like she is responding to internal stimuli.  Has been on a very wide number of antidepressants antipsychotics antianxiety medicines etc.  Her behavior problems have escalated this year.  As noted above she has had now 3 consecutive hospitalizations without any time outside the hospital at all.  Risk to Self:   Risk to Others:   Prior Inpatient Therapy:   Prior Outpatient Therapy:    Past Medical History:  Past Medical History:  Diagnosis Date  . Anxiety   . Asthma   .  Depression   . GERD (gastroesophageal reflux disease)   . Hallucinations 09/30/2014   Sizoaffective  . Hyperlipidemia   . Hypertension   . Intentional ingestion of batteries 04/21/2016    2 AAA batteries and 1  thumb tack; intent to hurt herself/notes 04/21/2016  . Tardive dyskinesia 10/2014   recent onset    Past Surgical History:  Procedure Laterality Date  . ABDOMINAL SURGERY     "years ago" to remove foreign objects  . APPENDECTOMY    . BREAST EXCISIONAL BIOPSY Right 09/03/2007   surgical bx removed fibroadeoma  . BREAST LUMPECTOMY Right   . COLONOSCOPY WITH PROPOFOL N/A 09/10/2015   Procedure: COLONOSCOPY WITH PROPOFOL;  Surgeon: [name], MD;  Location[facility] ENDOSCOPY;  Service: Endoscopy;  Laterality: N/A;  . ESOPHAGOGASTRODUODENOSCOPY N/A 11/28/2014   Procedure: ESOPHAGOGASTRODUODENOSCOPY (EGD);  Surgeon: [name], MD;  Location: [facility] ENDOSCOPY;  Service: Endoscopy;  Laterality: N/A;  . ESOPHAGOGASTRODUODENOSCOPY N/A 02/21/2016   Procedure: ESOPHAGOGASTRODUODENOSCOPY (EGD);  Surgeon: Mauri Pole, MD;  Location: [facility] ENDOSCOPY;  Service: Endoscopy;  Laterality: N/A;  . ESOPHAGOGASTRODUODENOSCOPY N/A 04/21/2016   Procedure: ESOPHAGOGASTRODUODENOSCOPY (EGD);  Surgeon: Gatha Mayer, MD;  Location[facility] ENDOSCOPY;  Service: Endoscopy;  Laterality: N/A;  . ESOPHAGOGASTRODUODENOSCOPY N/A 05/06/2016   Procedure: ESOPHAGOGASTRODUODENOSCOPY (EGD);  Surgeon: Jonathon Bellows, MD;  Location: [facility] ENDOSCOPY;  Service: Gastroenterology;  Laterality: N/A;  . ESOPHAGOGASTRODUODENOSCOPY N/A 06/13/2016   Procedure: ESOPHAGOGASTRODUODENOSCOPY (EGD);  Surgeon: [name], MD;  Location: [facility] ENDOSCOPY;  Service: Endoscopy;  Laterality: N/A;  . ESOPHAGOGASTRODUODENOSCOPY (EGD) WITH PROPOFOL N/A 02/29/2016   Procedure: ESOPHAGOGASTRODUODENOSCOPY (EGD) WITH PROPOFOL;  Surgeon: [name], MD;  Location: [facility] ENDOSCOPY;  Service: Endoscopy;  Laterality: N/A;  . ESOPHAGOGASTRODUODENOSCOPY (EGD) WITH PROPOFOL N/A 01/15/2018   Procedure: ESOPHAGOGASTRODUODENOSCOPY (EGD) WITH PROPOFOL;  Surgeon: Daneil Dolin, MD;  Location: [facility] ENDO SUITE;  Service: Endoscopy;  Laterality: N/A;  retrieval forein body  .  LAPAROTOMY N/A 09/12/2015   Procedure: EXPLORATORY LAPAROTOMY;  Surgeon: Florene Glen, MD;  Location: [facility] ORS;  Service: General;  Laterality: N/A;  . SIGMOIDOSCOPY N/A 09/12/2015   Procedure: Lonell Face;  Surgeon: Florene Glen, MD;  Location: [facility] ORS;  Service: General;  Laterality: N/A;  . WISDOM TOOTH EXTRACTION     Family History:  Family History  Problem Relation Age of Onset  . Depression Mother   . Hypertension Mother   . Sleep apnea Mother   . Asthma Mother   . COPD Mother   . Diabetes Mother   . Breast cancer Maternal Aunt        20's   Family Psychiatric  History: Unknown Social History:  Social History   Substance and Sexual Activity  Alcohol Use No     Social History   Substance and Sexual Activity  Drug Use No    Social History   Socioeconomic History  . Marital status: Single    Spouse name: Not on file  . Number of children: Not on file  . Years of education: Not on file  . Highest education level: Not on file  Occupational History  . Not on file  Social Needs  . Financial resource strain: Not on file  . Food insecurity:    Worry: Not on file    Inability: Not on file  . Transportation needs:    Medical: Not on file    Non-medical: Not on file  Tobacco Use  . Smoking status: Former Smoker    Packs/day: 0.50  Years: 3.00    Pack years: 1.50    Types: Cigarettes    Last attempt to quit: 08/2016    Years since quitting: 1.4  . Smokeless tobacco: Never Used  Substance and Sexual Activity  . Alcohol use: No  . Drug use: No  . Sexual activity: Never    Birth control/protection: Injection  Lifestyle  . Physical activity:    Days per week: Not on file    Minutes per session: Not on file  . Stress: Not on file  Relationships  . Social connections:    Talks on phone: Not on file    Gets together: Not on file    Attends religious service: Not on file    Active member of club or organization: Not on file    Attends meetings of  clubs or organizations: Not on file    Relationship status: Not on file  Other Topics Concern  . Not on file  Social History Narrative  . Not on file   Additional Social History:    Allergies:   Allergies  Allergen Reactions  . Betadine [Povidone Iodine] Other (See Comments)    Reaction:  Unknown   . Iodine Rash  . Shellfish-Derived Products Other (See Comments)    Reaction:  Unknown     Labs:  Results for orders placed or performed during the hospital encounter of 01/31/18 (from the past 48 hour(s))  Comprehensive metabolic panel     Status: None   Collection Time: 01/31/18  5:19 PM  Result Value Ref Range   Sodium 140 135 - 145 mmol/L   Potassium 4.0 3.5 - 5.1 mmol/L   Chloride 108 98 - 111 mmol/L   CO2 26 22 - 32 mmol/L   Glucose, Bld 96 70 - 99 mg/dL   BUN 8 6 - 20 mg/dL   Creatinine, Ser 0.63 0.44 - 1.00 mg/dL   Calcium 9.4 8.9 - 10.3 mg/dL   Total Protein 6.9 6.5 - 8.1 g/dL   Albumin 4.2 3.5 - 5.0 g/dL   AST 18 15 - 41 U/L   ALT 10 0 - 44 U/L   Alkaline Phosphatase 77 38 - 126 U/L   Total Bilirubin 0.5 0.3 - 1.2 mg/dL   GFR calc non Af Amer >60 >60 mL/min   GFR calc Af Amer >60 >60 mL/min    Comment: (NOTE) The eGFR has been calculated using the CKD EPI equation. This calculation has not been validated in all clinical situations. eGFR's persistently <60 mL/min signify possible Chronic Kidney Disease.    Anion gap 6 5 - 15    Comment: Performed at [facility], [address] [place], [place] [address]  Ethanol     Status: None   Collection Time: 01/31/18  5:19 PM  Result Value Ref Range   Alcohol, Ethyl (B) <10 <10 mg/dL    Comment: (NOTE) Lowest detectable limit for serum alcohol is 10 mg/dL. For medical purposes only. Performed at [facility], [address] [place], [place] [address]   cbc     Status: None   Collection Time: 01/31/18  5:19 PM  Result Value Ref Range   WBC 9.2 3.6 - 11.0 K/uL   RBC 4.13 3.80 - 5.20 MIL/uL    Hemoglobin 12.9 12.0 - 16.0 g/dL   HCT 38.2 35.0 - 47.0 %   MCV 92.5 80.0 - 100.0 fL   MCH 31.3 26.0 - 34.0 pg   MCHC 33.9 32.0 - 36.0 g/dL  RDW 14.4 11.5 - 14.5 %   Platelets 238 150 - 440 K/uL    Comment: Performed at [facility], [address] [place], [place] [address]  Pregnancy, urine POC     Status: None   Collection Time: 01/31/18  6:01 PM  Result Value Ref Range   Preg Test, Ur NEGATIVE NEGATIVE    Comment:        THE SENSITIVITY OF THIS METHODOLOGY IS >24 mIU/mL     No current facility-administered medications for this encounter.    Current Outpatient Medications  Medication Sig Dispense Refill  . amLODipine (NORVASC) 2.5 MG tablet Take 2.5 mg by mouth daily.    . cetirizine (ZYRTEC) 10 MG tablet Take 1 tablet (10 mg total) by mouth daily as needed for allergies. 30 tablet 1  . cloZAPine (CLOZARIL) 25 MG tablet Take 7 tablets (175 mg total) by mouth at bedtime. 210 tablet 0  . docusate sodium (COLACE) 100 MG capsule Take 1 capsule (100 mg total) by mouth daily as needed. (Patient taking differently: Take 100 mg by mouth daily as needed for mild constipation or moderate constipation. ) 30 capsule 0  . hydrOXYzine (ATARAX/VISTARIL) 50 MG tablet Take 1 tablet (50 mg total) by mouth 2 (two) times daily as needed. 90 tablet 1  . levothyroxine (SYNTHROID, LEVOTHROID) 100 MCG tablet Take 1 tablet (100 mcg total) by mouth daily before breakfast. 30 tablet 1  . lithium carbonate 300 MG capsule Take 1 capsule (300 mg total) by mouth 2 (two) times daily with a meal. 60 capsule 1  . medroxyPROGESTERone (DEPO-PROVERA) 150 MG/ML injection Inject 1 mL (150 mg total) into the muscle every 3 (three) months. 1 mL 1  . omeprazole (PRILOSEC) 20 MG capsule Take 1 capsule (20 mg total) by mouth daily. 30 capsule 1  . OXcarbazepine (TRILEPTAL) 300 MG tablet Take 1 tablet (300 mg total) by mouth 2 (two) times daily. 60 tablet 1  . polyethylene glycol (MIRALAX / GLYCOLAX) packet  Take 17 g by mouth daily as needed. 30 each 1  . prazosin (MINIPRESS) 2 MG capsule Take 1 capsule (2 mg total) by mouth at bedtime. 30 capsule 0  . senna (SENOKOT) 8.6 MG TABS tablet Take 1 tablet (8.6 mg total) by mouth daily as needed for mild constipation. 120 each 0  . traZODone (DESYREL) 50 MG tablet Take 1 tablet (50 mg total) by mouth at bedtime. 30 tablet 1  . venlafaxine XR (EFFEXOR-XR) 150 MG 24 hr capsule Take 1 capsule (150 mg total) by mouth daily with breakfast. 30 capsule 1  . Vitamin D, Ergocalciferol, (DRISDOL) 50000 units CAPS capsule Take 1 capsule (50,000 Units total) by mouth every 7 (seven) days. 4 capsule 1    Musculoskeletal: Strength & Muscle Tone: within normal limits Gait & Station: normal Patient leans: N/A  Psychiatric Specialty Exam: Physical Exam  Nursing note and vitals reviewed. Constitutional: She appears well-developed and well-nourished.  HENT:  Head: Normocephalic and atraumatic.  Eyes: Pupils are equal, round, and reactive to light. Conjunctivae are normal.  Neck: Normal range of motion.  Cardiovascular: Regular rhythm and normal heart sounds.  Respiratory: Effort normal. No respiratory distress.  GI: Soft.  Musculoskeletal: Normal range of motion.  Neurological: She is alert.  Skin: Skin is warm and dry.  Psychiatric: Her mood appears anxious. Her affect is blunt. Her speech is delayed. She is slowed and withdrawn. Cognition and memory are impaired. She expresses impulsivity. She expresses suicidal ideation. She expresses suicidal  plans.    Review of Systems  Constitutional: Negative.   HENT: Negative.   Eyes: Negative.   Respiratory: Negative.   Cardiovascular: Negative.   Gastrointestinal: Negative.   Musculoskeletal: Negative.   Skin: Negative.   Neurological: Negative.   Psychiatric/Behavioral: Positive for depression, hallucinations and suicidal ideas. Negative for memory loss and substance abuse. The patient is nervous/anxious and  has insomnia.     Height _0  (1.676 m), weight 90.7 kg, last menstrual period 01/31/2018.Body mass index is 32.28 kg/m.  General Appearance: Casual  Eye Contact:  Fair  Speech:  Slow  Volume:  Decreased  Mood:  Dysphoric  Affect:  Congruent and Constricted  Thought Process:  Goal Directed  Orientation:  Full (Time, Place, and Person)  Thought Content:  Illogical  Suicidal Thoughts:  Yes.  with intent/plan  Homicidal Thoughts:  No  Memory:  Immediate;   Fair Recent;   Fair Remote;   Fair  Judgement:  Impaired  Insight:  Shallow  Psychomotor Activity:  Decreased  Concentration:  Concentration: Fair  Recall:  [employer] of Knowledge:  Fair  Language:  Fair  Akathisia:  No  Handed:  Right  AIMS (if indicated):     Assets:  Resilience  ADL's:  Impaired  Cognition:  Impaired,  Mild  Sleep:        Treatment Plan Summary: Plan Patient comes into the hospital with a plan and intention of swallowing some nail clippers and a battery.  The bottom line here is that these behaviors are very clearly 100% intentional and manipulative.  I do not believe that the patient thinks that she is going to die from swallowing these objects.  She has had a very large amount of experience with this and it should be obvious that the outcome is that she forces the group home to take her to a hospital where she prefers to be rather than to be at the group home.  I do not believe that this is the result of a psychosis.  Furthermore I do not believe that there is any specific treatment that we are going to provide in the hospital that is likely to change the trajectory of this.  On the other hand, I think it is essentially a guarantee that if we were to send her back to her group home that she would swallow inanimate objects forcing herself back into the emergency room and possibly causing herself some harm in the process.  This is a very difficult situation.  Rather than immediately admitting her I am going to  leave her in the emergency room for now I think we need to discuss this among the psychiatric staff before making a decision going forward.  Disposition: Supportive therapy provided about ongoing stressors.  Alethia Berthold, MD 01/31/2018 6:13 PM

## 2018-01-31 NOTE — ED Notes (Signed)
Per Dr Clearnce Hasten discontinue 1:1 sitter for patient

## 2018-01-31 NOTE — BH Assessment (Signed)
Writer left hippa compliant vm for guardian [name] at [phone]

## 2018-02-01 DIAGNOSIS — F250 Schizoaffective disorder, bipolar type: Secondary | ICD-10-CM | POA: Diagnosis not present

## 2018-02-01 NOTE — ED Notes (Signed)
Pt took a shower

## 2018-02-01 NOTE — ED Notes (Signed)
Hourly rounding reveals patient in room. No complaints, stable, in no acute distress. Q15 minute rounds and monitoring via Security Cameras to continue. 

## 2018-02-01 NOTE — ED Notes (Signed)
Hourly rounding reveals patient in room sleeping. No complaints, stable, in no acute distress. Q15 minute rounds and monitoring via [employer] to continue.

## 2018-02-01 NOTE — ED Notes (Signed)
Pt given supper tray and ginger ale

## 2018-02-01 NOTE — ED Provider Notes (Signed)
I spoke with Dr. Weber Cooks, face-to-face who is recommending admission here.   Lisa Roca, MD 02/01/18 [phone]

## 2018-02-01 NOTE — ED Notes (Signed)
Pt. Transferred to [place] from ED to room after screening for contraband. Report to include Situation, Background, Assessment and Recommendations from RN Mat. Pt. Oriented to unit including Q15 minute rounds as well as the security cameras for their protection. Patient is alert and oriented, warm and dry in no acute distress. Patient reported SI and auditory hallucination. Pt denied HI, and VH. Pt contracted for safety. Pt. Encouraged to let me know if needs arise.

## 2018-02-01 NOTE — Progress Notes (Signed)
Pt asleep in bed at this time. Denies HI, VH and pain when assessed. Per pt "I'm still the same, I'm not doing well today". Endorsed passive SI without a plan "it's on and off but I don't want any plan right now". Verbally contracts for safety. Pt remains medication compliant. Emotional support provided as needed throughout this shift. Safety checks continues at Q 15 minutes intervals without self harm gestures or outburst to note at this time.

## 2018-02-01 NOTE — Consult Note (Signed)
[place] [facility] Consult   Reason for Consult: Consult follow-up patient was schizoaffective disorder and chronic behavior problems.  Patient continues to endorse auditory hallucinations suicidal thoughts thoughts of swallowing inanimate objects or cutting herself. Referring Physician: Alfred Levins Patient Identification: Melanie Bell MRN:  [id number] Principal Diagnosis: Schizoaffective disorder, bipolar type [facility]) Diagnosis:   Patient Active Problem List   Diagnosis Date Noted  . Suicide attempt ([place]) [T14.91XA] 01/16/2018  . Dysfunctional uterine bleeding [N93.8] 01/06/2018  . Borderline personality disorder ([place]) [F60.3] 01/04/2018  . PTSD (post-traumatic stress disorder) [F43.10] 01/03/2018  . Suicidal ideation [R45.851] 01/02/2018  . Self-inflicted injury [V69.45] 11/23/2017  . ASCUS of cervix with negative high risk HPV [R87.610] 08/28/2017  . Malingering [Z76.5] 05/06/2016  . Foreign body aspiration [T17.900A]   . Other foreign object in esophagus causing other injury, subsequent encounter [T18.198D]   . Swallowed foreign body [T18.9XXA] 04/23/2016  . Foreign body ingestion [T18.9XXA] 04/21/2016  . Gastric foreign body [T18.2XXA]   . 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 Schizoaffective disorder, bipolar type ([place]) [F25.0] 11/06/2014  . Tobacco use disorder [F17.200] 09/30/2014  . Hypothyroidism [E03.9] 09/29/2014  . Hypertension [I10] 09/29/2014    Total Time spent with patient: 30 minutes  Subjective:   Melanie Bell is a 27 y.o. female patient admitted with "I still do not feel safe".  HPI: Follow-up patient continues to express depressed mood low energy hopelessness also says she has auditory hallucinations with thoughts of killing herself.  Does not feel safe back at her group home.  Has been withdrawn but compliant with medicine here in the hospital.  Appears to be medically stable.  Past Psychiatric History: Multiple hospitalizations with  difficulty staying out of the hospital especially with escalating problems recently  Risk to Self: Suicidal Ideation: Yes-Currently Present Suicidal Intent: Yes-Currently Present Is patient at risk for suicide?: Yes Suicidal Plan?: Yes-Currently Present Specify Current Suicidal Plan: Pt has plan to swallow batteries of fingernail clippers Access to Means: Yes Specify Access to Suicidal Means: batteries, clippers What has been your use of drugs/alcohol within the last 12 months?: None identified How many times?: 10 Triggers for Past Attempts: Other personal contacts, Hallucinations Intentional Self Injurious Behavior: Cutting Comment - Self Injurious Behavior: Pt reports "years" since last self inflicted cuts Risk to Others: Homicidal Ideation: No Thoughts of Harm to Others: No Current Homicidal Intent: No Current Homicidal Plan: No Access to Homicidal Means: No History of harm to others?: No Assessment of Violence: None Noted Violent Behavior Description: None noted Does patient have access to weapons?: No Criminal Charges Pending?: No Does patient have a court date: No Prior Inpatient Therapy: Prior Inpatient Therapy: Yes Prior Therapy Dates: 01/2018-mutliple hospit. Prior Therapy Facilty/Provider(s): [facility], [facility], [facility], [place],  Reason for Treatment: MH, depression, schizoaffective Prior Outpatient Therapy: Prior Outpatient Therapy: Yes Prior Therapy Dates: current Prior Therapy Facilty/Provider(s): [facility] Reason for Treatment: depression Does patient have an ACCT team?: Yes Does patient have Intensive In-House Services?  : No Does patient have [facility] services? : No Does patient have P4CC services?: No  Past Medical History:  Past Medical History:  Diagnosis Date  . Anxiety   . Asthma   . Depression   . GERD (gastroesophageal reflux disease)   . Hallucinations 09/30/2014   Sizoaffective  . Hyperlipidemia   . Hypertension   . Intentional  ingestion of batteries 04/21/2016    2 AAA batteries and 1 thumb tack; intent to hurt herself/notes 04/21/2016  . Tardive dyskinesia 10/2014  recent onset    Past Surgical History:  Procedure Laterality Date  . ABDOMINAL SURGERY     "years ago" to remove foreign objects  . APPENDECTOMY    . BREAST EXCISIONAL BIOPSY Right 09/03/2007   surgical bx removed fibroadeoma  . BREAST LUMPECTOMY Right   . COLONOSCOPY WITH PROPOFOL N/A 09/10/2015   Procedure: COLONOSCOPY WITH PROPOFOL;  Surgeon: [name], MD;  Location: [facility] ENDOSCOPY;  Service: Endoscopy;  Laterality: N/A;  . ESOPHAGOGASTRODUODENOSCOPY N/A 11/28/2014   Procedure: ESOPHAGOGASTRODUODENOSCOPY (EGD);  Surgeon: [name], MD;  Location: [facility] ENDOSCOPY;  Service: Endoscopy;  Laterality: N/A;  . ESOPHAGOGASTRODUODENOSCOPY N/A 02/21/2016   Procedure: ESOPHAGOGASTRODUODENOSCOPY (EGD);  Surgeon: Mauri Pole, MD;  Location[facility] ENDOSCOPY;  Service: Endoscopy;  Laterality: N/A;  . ESOPHAGOGASTRODUODENOSCOPY N/A 04/21/2016   Procedure: ESOPHAGOGASTRODUODENOSCOPY (EGD);  Surgeon: Gatha Mayer, MD;  Location: [facility] ENDOSCOPY;  Service: Endoscopy;  Laterality: N/A;  . ESOPHAGOGASTRODUODENOSCOPY N/A 05/06/2016   Procedure: ESOPHAGOGASTRODUODENOSCOPY (EGD);  Surgeon: Jonathon Bellows, MD;  Location[facility] ENDOSCOPY;  Service: Gastroenterology;  Laterality: N/A;  . ESOPHAGOGASTRODUODENOSCOPY N/A 06/13/2016   Procedure: ESOPHAGOGASTRODUODENOSCOPY (EGD);  Surgeon: [name], MD;  Location: [facility] ENDOSCOPY;  Service: Endoscopy;  Laterality: N/A;  . ESOPHAGOGASTRODUODENOSCOPY (EGD) WITH PROPOFOL N/A 02/29/2016   Procedure: ESOPHAGOGASTRODUODENOSCOPY (EGD) WITH PROPOFOL;  Surgeon: [name], MD;  Location: [facility] ENDOSCOPY;  Service: Endoscopy;  Laterality: N/A;  . ESOPHAGOGASTRODUODENOSCOPY (EGD) WITH PROPOFOL N/A 01/15/2018   Procedure: ESOPHAGOGASTRODUODENOSCOPY (EGD) WITH PROPOFOL;  Surgeon: Daneil Dolin, MD;  [facility] ENDO SUITE;   Service: Endoscopy;  Laterality: N/A;  retrieval forein body  . LAPAROTOMY N/A 09/12/2015   Procedure: EXPLORATORY LAPAROTOMY;  Surgeon: Florene Glen, MD;  Location: [facility] ORS;  Service: General;  Laterality: N/A;  . SIGMOIDOSCOPY N/A 09/12/2015   Procedure: Lonell Face;  Surgeon: Florene Glen, MD;  Location: [facility] ORS;  Service: General;  Laterality: N/A;  . WISDOM TOOTH EXTRACTION     Family History:  Family History  Problem Relation Age of Onset  . Depression Mother   . Hypertension Mother   . Sleep apnea Mother   . Asthma Mother   . COPD Mother   . Diabetes Mother   . Breast cancer Maternal Aunt        20's   Family Psychiatric  History: See previous note Social History:  Social History   Substance and Sexual Activity  Alcohol Use No     Social History   Substance and Sexual Activity  Drug Use No    Social History   Socioeconomic History  . Marital status: Single    Spouse name: Not on file  . Number of children: Not on file  . Years of education: Not on file  . Highest education level: Not on file  Occupational History  . Not on file  Social Needs  . Financial resource strain: Not on file  . Food insecurity:    Worry: Not on file    Inability: Not on file  . Transportation needs:    Medical: Not on file    Non-medical: Not on file  Tobacco Use  . Smoking status: Former Smoker    Packs/day: 0.50    Years: 3.00    Pack years: 1.50    Types: Cigarettes    Last attempt to quit: 08/2016    Years since quitting: 1.4  . Smokeless tobacco: Never Used  Substance and Sexual Activity  . Alcohol use: No  . Drug use: No  . Sexual  activity: Never    Birth control/protection: Injection  Lifestyle  . Physical activity:    Days per week: Not on file    Minutes per session: Not on file  . Stress: Not on file  Relationships  . Social connections:    Talks on phone: Not on file    Gets together: Not on file    Attends religious service: Not on file     Active member of club or organization: Not on file    Attends meetings of clubs or organizations: Not on file    Relationship status: Not on file  Other Topics Concern  . Not on file  Social History Narrative  . Not on file   Additional Social History:    Allergies:   Allergies  Allergen Reactions  . Betadine [Povidone Iodine] Other (See Comments)    Reaction:  Unknown   . Iodine Rash  . Shellfish-Derived Products Other (See Comments)    Reaction:  Unknown     Labs:  Results for orders placed or performed during the hospital encounter of 01/31/18 (from the past 48 hour(s))  Comprehensive metabolic panel     Status: None   Collection Time: 01/31/18  5:19 PM  Result Value Ref Range   Sodium 140 135 - 145 mmol/L   Potassium 4.0 3.5 - 5.1 mmol/L   Chloride 108 98 - 111 mmol/L   CO2 26 22 - 32 mmol/L   Glucose, Bld 96 70 - 99 mg/dL   BUN 8 6 - 20 mg/dL   Creatinine, Ser 0.63 0.44 - 1.00 mg/dL   Calcium 9.4 8.9 - 10.3 mg/dL   Total Protein 6.9 6.5 - 8.1 g/dL   Albumin 4.2 3.5 - 5.0 g/dL   AST 18 15 - 41 U/L   ALT 10 0 - 44 U/L   Alkaline Phosphatase 77 38 - 126 U/L   Total Bilirubin 0.5 0.3 - 1.2 mg/dL   GFR calc non Af Amer >60 >60 mL/min   GFR calc Af Amer >60 >60 mL/min    Comment: (NOTE) The eGFR has been calculated using the CKD EPI equation. This calculation has not been validated in all clinical situations. eGFR's persistently <60 mL/min signify possible Chronic Kidney Disease.    Anion gap 6 5 - 15    Comment: Performed at [facility], [address] [place], [place] [address]  Ethanol     Status: None   Collection Time: 01/31/18  5:19 PM  Result Value Ref Range   Alcohol, Ethyl (B) <10 <10 mg/dL    Comment: (NOTE) Lowest detectable limit for serum alcohol is 10 mg/dL. For medical purposes only. Performed at [facility], [address] [place], [place] [address]   Salicylate level     Status: None   Collection Time: 01/31/18  5:19  PM  Result Value Ref Range   Salicylate Lvl <4.7 2.8 - 30.0 mg/dL    Comment: Performed at [facility], [address] [place],  [address]  Acetaminophen level     Status: Abnormal   Collection Time: 01/31/18  5:19 PM  Result Value Ref Range   Acetaminophen (Tylenol), Serum <10 (L) 10 - 30 ug/mL    Comment: (NOTE) Therapeutic concentrations vary significantly. A range of 10-30 ug/mL  may be an effective concentration for many patients. However, some  are best treated at concentrations outside of this range. Acetaminophen concentrations >150 ug/mL at 4 hours after ingestion  and >50 ug/mL at 12  hours after ingestion are often associated with  toxic reactions. Performed at [facility], [address] [place], [place] [address]   cbc     Status: None   Collection Time: 01/31/18  5:19 PM  Result Value Ref Range   WBC 9.2 3.6 - 11.0 K/uL   RBC 4.13 3.80 - 5.20 MIL/uL   Hemoglobin 12.9 12.0 - 16.0 g/dL   HCT 38.2 35.0 - 47.0 %   MCV 92.5 80.0 - 100.0 fL   MCH 31.3 26.0 - 34.0 pg   MCHC 33.9 32.0 - 36.0 g/dL   RDW 14.4 11.5 - 14.5 %   Platelets 238 150 - 440 K/uL    Comment: Performed at [facility], [address] [place],  [address]  Differential     Status: Abnormal   Collection Time: 01/31/18  5:19 PM  Result Value Ref Range   Neutrophils Relative % 72 %   Neutro Abs 6.8 (H) 1.4 - 6.5 K/uL   Lymphocytes Relative 21 %   Lymphs Abs 1.9 1.0 - 3.6 K/uL   Monocytes Relative 5 %   Monocytes Absolute 0.5 0.2 - 0.9 K/uL   Eosinophils Relative 1 %   Eosinophils Absolute 0.1 0 - 0.7 K/uL   Basophils Relative 1 %   Basophils Absolute 0.1 0 - 0.1 K/uL    Comment: Performed at [facility], [address] [place],  [address]  Urine Drug Screen, Qualitative     Status: None   Collection Time: 01/31/18  5:56 PM  Result Value Ref Range   Tricyclic, Ur Screen NONE DETECTED NONE DETECTED   Amphetamines, Ur Screen NONE  DETECTED NONE DETECTED   MDMA (Ecstasy)Ur Screen NONE DETECTED NONE DETECTED   Cocaine Metabolite,Ur [place] NONE DETECTED NONE DETECTED   Opiate, Ur Screen NONE DETECTED NONE DETECTED   Phencyclidine (PCP) Ur S NONE DETECTED NONE DETECTED   Cannabinoid 50 Ng, Ur [place] NONE DETECTED NONE DETECTED   Barbiturates, Ur Screen NONE DETECTED NONE DETECTED   Benzodiazepine, Ur Scrn NONE DETECTED NONE DETECTED   Methadone Scn, Ur NONE DETECTED NONE DETECTED    Comment: (NOTE) Tricyclics + metabolites, urine    Cutoff 1000 ng/mL Amphetamines + metabolites, urine  Cutoff 1000 ng/mL MDMA (Ecstasy), urine              Cutoff 500 ng/mL Cocaine Metabolite, urine          Cutoff 300 ng/mL Opiate + metabolites, urine        Cutoff 300 ng/mL Phencyclidine (PCP), urine         Cutoff 25 ng/mL Cannabinoid, urine                 Cutoff 50 ng/mL Barbiturates + metabolites, urine  Cutoff 200 ng/mL Benzodiazepine, urine              Cutoff 200 ng/mL Methadone, urine                   Cutoff 300 ng/mL The urine drug screen provides only a preliminary, unconfirmed analytical test result and should not be used for non-medical purposes. Clinical consideration and professional judgment should be applied to any positive drug screen result due to possible interfering substances. A more specific alternate chemical method must be used in order to obtain a confirmed analytical result. Gas chromatography / mass spectrometry (GC/MS) is the preferred confirmat ory method. Performed at [facility], [address], [place],  [address]  Pregnancy, urine POC     Status: None   Collection Time: 01/31/18  6:01 PM  Result Value Ref Range   Preg Test, Ur NEGATIVE NEGATIVE    Comment:        THE SENSITIVITY OF THIS METHODOLOGY IS >24 mIU/mL     Current Facility-Administered Medications  Medication Dose Route Frequency Provider Last Rate Last Dose  . amLODipine (NORVASC) tablet 2.5 mg  2.5 mg Oral Daily  ,  T, MD   2.5 mg at 02/01/18 1046  . cloZAPine (CLOZARIL) tablet 200 mg  200 mg Oral QHS ,  T, MD   200 mg at 02/01/18 0056  . docusate sodium (COLACE) capsule 100 mg  100 mg Oral BID , [name], MD   100 mg at 02/01/18 1042  . levothyroxine (SYNTHROID, LEVOTHROID) tablet 100 mcg  100 mcg Oral QAC breakfast , [name], MD   100 mcg at 02/01/18 0851  . lithium carbonate capsule 300 mg  300 mg Oral BID WC , [name], MD   300 mg at 02/01/18 0851  . loratadine (CLARITIN) tablet 10 mg  10 mg Oral Daily , [name], MD   10 mg at 02/01/18 1040  . Oxcarbazepine (TRILEPTAL) tablet 300 mg  300 mg Oral BID , [name], MD   300 mg at 02/01/18 1040  . pantoprazole (PROTONIX) EC tablet 40 mg  40 mg Oral Daily , [name], MD   40 mg at 02/01/18 1040  . prazosin (MINIPRESS) capsule 2 mg  2 mg Oral QHS , [name], MD   2 mg at 01/31/18 2203  . traZODone (DESYREL) tablet 100 mg  100 mg Oral QHS ,  T, MD   100 mg at 01/31/18 2204  . venlafaxine XR (EFFEXOR-XR) 24 hr capsule 150 mg  150 mg Oral Q breakfast , [name], MD   150 mg at 02/01/18 7026   Current Outpatient Medications  Medication Sig Dispense Refill  . amLODipine (NORVASC) 2.5 MG tablet Take 2.5 mg by mouth daily.    . cetirizine (ZYRTEC) 10 MG tablet Take 1 tablet (10 mg total) by mouth daily as needed for allergies. 30 tablet 1  . cloZAPine (CLOZARIL) 25 MG tablet Take 7 tablets (175 mg total) by mouth at bedtime. 210 tablet 0  . docusate sodium (COLACE) 100 MG capsule Take 1 capsule (100 mg total) by mouth daily as needed. (Patient taking differently: Take 100 mg by mouth daily as needed for mild constipation or moderate constipation. ) 30 capsule 0  . hydrOXYzine (ATARAX/VISTARIL) 50 MG tablet Take 1 tablet (50 mg total) by mouth 2 (two) times daily as needed. 90 tablet 1  . levothyroxine (SYNTHROID, LEVOTHROID) 100 MCG tablet Take 1 tablet (100 mcg total) by mouth daily before  breakfast. 30 tablet 1  . lithium carbonate 300 MG capsule Take 1 capsule (300 mg total) by mouth 2 (two) times daily with a meal. 60 capsule 1  . medroxyPROGESTERone (DEPO-PROVERA) 150 MG/ML injection Inject 1 mL (150 mg total) into the muscle every 3 (three) months. 1 mL 1  . omeprazole (PRILOSEC) 20 MG capsule Take 1 capsule (20 mg total) by mouth daily. 30 capsule 1  . OXcarbazepine (TRILEPTAL) 300 MG tablet Take 1 tablet (300 mg total) by mouth 2 (two) times daily. 60 tablet 1  . polyethylene glycol (MIRALAX / GLYCOLAX) packet Take 17 g by mouth daily as needed. 30 each 1  . prazosin (MINIPRESS) 2 MG capsule Take 1  capsule (2 mg total) by mouth at bedtime. 30 capsule 0  . senna (SENOKOT) 8.6 MG TABS tablet Take 1 tablet (8.6 mg total) by mouth daily as needed for mild constipation. 120 each 0  . traZODone (DESYREL) 50 MG tablet Take 1 tablet (50 mg total) by mouth at bedtime. 30 tablet 1  . venlafaxine XR (EFFEXOR-XR) 150 MG 24 hr capsule Take 1 capsule (150 mg total) by mouth daily with breakfast. 30 capsule 1  . Vitamin D, Ergocalciferol, (DRISDOL) 50000 units CAPS capsule Take 1 capsule (50,000 Units total) by mouth every 7 (seven) days. 4 capsule 1    Musculoskeletal: Strength & Muscle Tone: within normal limits Gait & Station: normal Patient leans: N/A  Psychiatric Specialty Exam: Physical Exam  Nursing note and vitals reviewed. Constitutional: She appears well-developed and well-nourished.  HENT:  Head: Normocephalic and atraumatic.  Eyes: Pupils are equal, round, and reactive to light. Conjunctivae are normal.  Neck: Normal range of motion.  Cardiovascular: Regular rhythm and normal heart sounds.  Respiratory: Effort normal. No respiratory distress.  GI: Soft.  Musculoskeletal: Normal range of motion.  Neurological: She is alert.  Skin: Skin is warm and dry.  Psychiatric: Her affect is blunt. Her speech is delayed. She is slowed and withdrawn. Cognition and memory are  impaired. She expresses impulsivity. She exhibits a depressed mood. She expresses suicidal ideation.    Review of Systems  Constitutional: Negative.   HENT: Negative.   Eyes: Negative.   Respiratory: Negative.   Cardiovascular: Negative.   Gastrointestinal: Negative.   Musculoskeletal: Negative.   Skin: Negative.   Neurological: Negative.   Psychiatric/Behavioral: Positive for depression, hallucinations and suicidal ideas.    Blood pressure 126/75, pulse 76, temperature 98 F (36.7 C), temperature source Oral, resp. rate 18, height 5' 6" (1.676 m), weight 90.7 kg, last menstrual period 01/31/2018, SpO2 100 %.Body mass index is 32.28 kg/m.  General Appearance: Casual  Eye Contact:  Minimal  Speech:  Slow  Volume:  Decreased  Mood:  Depressed and Dysphoric  Affect:  Blunt  Thought Process:  Coherent  Orientation:  Full (Time, Place, and Person)  Thought Content:  Logical and Hallucinations: Auditory  Suicidal Thoughts:  Yes.  with intent/plan  Homicidal Thoughts:  No  Memory:  Immediate;   Fair Recent;   Fair Remote;   Fair  Judgement:  Impaired  Insight:  Shallow  Psychomotor Activity:  Decreased  Concentration:  Concentration: Poor  Recall:  [employer] of Knowledge:  Fair  Language:  Fair  Akathisia:  No  Handed:  Right  AIMS (if indicated):     Assets:  Desire for Improvement Housing Resilience  ADL's:  Impaired  Cognition:  Impaired,  Mild  Sleep:        Treatment Plan Summary: Daily contact with patient to assess and evaluate symptoms and progress in treatment, Medication management and Plan Patient continues to endorse suicidal ideation.  After review and discussion with other providers it is clear that the patient's behavior will make it impossible for her to be discharged safely in the current condition because of ongoing suicidal behavior.  Patient will be admitted to the psychiatric ward.  15-minute checks.  Continue current medicine.  Review labs.  Engage  patient in individual and group therapy and med management as appropriate and arrange for safe disposition when stable.  Disposition: Recommend psychiatric Inpatient admission when medically cleared. Supportive therapy provided about ongoing stressors.  Alethia Berthold, MD 02/01/2018 3:10 PM

## 2018-02-01 NOTE — ED Provider Notes (Signed)
-----------------------------------------   4:57 AM on 02/01/2018 -----------------------------------------   Blood pressure 129/70, pulse 66, temperature 98.3 F (36.8 C), temperature source Oral, resp. rate 16, height 5\' 6"  (1.676 m), weight 90.7 kg, last menstrual period 01/31/2018, SpO2 99 %.  The patient had no acute events since last update.  Calm and cooperative at this time.  Disposition is pending Psychiatry/Behavioral Medicine team recommendations.     Darel Hong, MD 02/01/18 [phone]

## 2018-02-02 ENCOUNTER — Other Ambulatory Visit: Payer: Self-pay

## 2018-02-02 ENCOUNTER — Inpatient Hospital Stay
Admission: AD | Admit: 2018-02-02 | Discharge: 2018-02-09 | DRG: 885 | Disposition: A | Discharge status: Home / Self Care | Payer: Medicare Other | Attending: Psychiatry | Admitting: Psychiatry

## 2018-02-02 DIAGNOSIS — Z87891 Personal history of nicotine dependence: Secondary | ICD-10-CM | POA: Diagnosis not present

## 2018-02-02 DIAGNOSIS — E039 Hypothyroidism, unspecified: Secondary | ICD-10-CM | POA: Diagnosis present

## 2018-02-02 DIAGNOSIS — F251 Schizoaffective disorder, depressive type: Secondary | ICD-10-CM | POA: Diagnosis present

## 2018-02-02 DIAGNOSIS — F603 Borderline personality disorder: Secondary | ICD-10-CM | POA: Diagnosis present

## 2018-02-02 DIAGNOSIS — I10 Essential (primary) hypertension: Secondary | ICD-10-CM | POA: Diagnosis present

## 2018-02-02 DIAGNOSIS — Z79890 Hormone replacement therapy (postmenopausal): Secondary | ICD-10-CM

## 2018-02-02 DIAGNOSIS — R45851 Suicidal ideations: Secondary | ICD-10-CM | POA: Diagnosis present

## 2018-02-02 DIAGNOSIS — E785 Hyperlipidemia, unspecified: Secondary | ICD-10-CM | POA: Diagnosis present

## 2018-02-02 DIAGNOSIS — Z818 Family history of other mental and behavioral disorders: Secondary | ICD-10-CM

## 2018-02-02 DIAGNOSIS — J45909 Unspecified asthma, uncomplicated: Secondary | ICD-10-CM | POA: Diagnosis present

## 2018-02-02 DIAGNOSIS — F4310 Post-traumatic stress disorder, unspecified: Secondary | ICD-10-CM | POA: Diagnosis present

## 2018-02-02 DIAGNOSIS — K219 Gastro-esophageal reflux disease without esophagitis: Secondary | ICD-10-CM | POA: Diagnosis present

## 2018-02-02 DIAGNOSIS — Z79899 Other long term (current) drug therapy: Secondary | ICD-10-CM

## 2018-02-02 DIAGNOSIS — K5900 Constipation, unspecified: Secondary | ICD-10-CM | POA: Diagnosis present

## 2018-02-02 DIAGNOSIS — I951 Orthostatic hypotension: Secondary | ICD-10-CM | POA: Diagnosis not present

## 2018-02-02 LAB — CBC WITH DIFFERENTIAL/PLATELET
BASOS PCT: 1 %
Basophils Absolute: 0.1 10*3/uL (ref 0–0.1)
EOS ABS: 0.1 10*3/uL (ref 0–0.7)
Eosinophils Relative: 2 %
HEMATOCRIT: 38.1 % (ref 35.0–47.0)
HEMOGLOBIN: 13 g/dL (ref 12.0–16.0)
LYMPHS ABS: 1.6 10*3/uL (ref 1.0–3.6)
Lymphocytes Relative: 24 %
MCH: 31.4 pg (ref 26.0–34.0)
MCHC: 34.1 g/dL (ref 32.0–36.0)
MCV: 92 fL (ref 80.0–100.0)
Monocytes Absolute: 0.5 10*3/uL (ref 0.2–0.9)
Monocytes Relative: 7 %
NEUTROS ABS: 4.7 10*3/uL (ref 1.4–6.5)
NEUTROS PCT: 68 %
Platelets: 237 10*3/uL (ref 150–440)
RBC: 4.14 MIL/uL (ref 3.80–5.20)
RDW: 14 % (ref 11.5–14.5)
WBC: 7 10*3/uL (ref 3.6–11.0)

## 2018-02-02 LAB — LITHIUM LEVEL: Lithium Lvl: 0.48 mmol/L — ABNORMAL LOW (ref 0.60–1.20)

## 2018-02-02 MED ORDER — OXCARBAZEPINE 300 MG PO TABS: 300.0000 mg | ORAL_TABLET | Freq: Two times a day (BID) | ORAL | Status: DC

## 2018-02-02 MED ORDER — CLOZAPINE 100 MG PO TABS: 200.0000 mg | ORAL_TABLET | Freq: Every day | ORAL | Status: DC

## 2018-02-02 MED ORDER — AMLODIPINE BESYLATE 5 MG PO TABS
2.5000 mg | ORAL_TABLET | Freq: Every day | ORAL | Status: DC
  Administered 2018-02-02 – 2018-02-09 (×8): 2.5 mg via ORAL
  Filled 2018-02-02 (×8): qty 1

## 2018-02-02 MED ORDER — LEVOTHYROXINE SODIUM 100 MCG PO TABS
100.0000 ug | ORAL_TABLET | Freq: Every day | ORAL | Status: DC
  Administered 2018-02-03 – 2018-02-09 (×7): 100 ug via ORAL
  Filled 2018-02-02 (×7): qty 1

## 2018-02-02 MED ORDER — ACETAMINOPHEN 325 MG PO TABS: 650.0000 mg | ORAL_TABLET | Freq: Four times a day (QID) | ORAL | Status: DC | PRN

## 2018-02-02 MED ORDER — OXCARBAZEPINE 300 MG PO TABS
600.0000 mg | ORAL_TABLET | Freq: Two times a day (BID) | ORAL | Status: DC
  Administered 2018-02-02 – 2018-02-09 (×15): 600 mg via ORAL
  Filled 2018-02-02 (×15): qty 2

## 2018-02-02 MED ORDER — LORATADINE 10 MG PO TABS
10.0000 mg | ORAL_TABLET | Freq: Every day | ORAL | Status: DC
  Administered 2018-02-02 – 2018-02-09 (×8): 10 mg via ORAL
  Filled 2018-02-02 (×8): qty 1

## 2018-02-02 MED ORDER — DOCUSATE SODIUM 100 MG PO CAPS
100.0000 mg | ORAL_CAPSULE | Freq: Two times a day (BID) | ORAL | Status: DC
  Administered 2018-02-02 – 2018-02-09 (×15): 100 mg via ORAL
  Filled 2018-02-02 (×16): qty 1

## 2018-02-02 MED ORDER — LITHIUM CARBONATE 300 MG PO CAPS
300.0000 mg | ORAL_CAPSULE | Freq: Two times a day (BID) | ORAL | Status: DC
  Administered 2018-02-02 – 2018-02-09 (×15): 300 mg via ORAL
  Filled 2018-02-02 (×15): qty 1

## 2018-02-02 MED ORDER — CLOZAPINE 25 MG PO TABS
250.0000 mg | ORAL_TABLET | Freq: Every day | ORAL | Status: DC
  Administered 2018-02-02 – 2018-02-09 (×8): 250 mg via ORAL
  Filled 2018-02-02 (×8): qty 2

## 2018-02-02 MED ORDER — PANTOPRAZOLE SODIUM 40 MG PO TBEC
40.0000 mg | DELAYED_RELEASE_TABLET | Freq: Every day | ORAL | Status: DC
  Administered 2018-02-02 – 2018-02-09 (×8): 40 mg via ORAL
  Filled 2018-02-02 (×8): qty 1

## 2018-02-02 MED ORDER — PRAZOSIN HCL 2 MG PO CAPS
2.0000 mg | ORAL_CAPSULE | Freq: Every day | ORAL | Status: DC
  Administered 2018-02-02 – 2018-02-09 (×7): 2 mg via ORAL
  Filled 2018-02-02 (×8): qty 1

## 2018-02-02 MED ORDER — TRAZODONE HCL 100 MG PO TABS
100.0000 mg | ORAL_TABLET | Freq: Every day | ORAL | Status: DC
  Administered 2018-02-02 – 2018-02-09 (×8): 100 mg via ORAL
  Filled 2018-02-02 (×8): qty 1

## 2018-02-02 MED ORDER — MAGNESIUM HYDROXIDE 400 MG/5ML PO SUSP: 30.0000 mL | Freq: Every day | ORAL | Status: DC | PRN

## 2018-02-02 MED ORDER — ALUM & MAG HYDROXIDE-SIMETH 200-200-20 MG/5ML PO SUSP
30.0000 mL | ORAL | Status: DC | PRN
  Administered 2018-02-08: 30 mL via ORAL
  Filled 2018-02-02: qty 30

## 2018-02-02 MED ORDER — VENLAFAXINE HCL ER 75 MG PO CP24
150.0000 mg | ORAL_CAPSULE | Freq: Every day | ORAL | Status: DC
  Administered 2018-02-03 – 2018-02-09 (×7): 150 mg via ORAL
  Filled 2018-02-02 (×7): qty 2

## 2018-02-02 NOTE — BH Assessment (Signed)
Patient is to be admitted to [facility] by Dr. Weber Cooks.  Attending Physician will be Dr. Bary Leriche.   Patient has been assigned to room 303-B, by [facility] Charge Nurse [place].   Intake Paper Work has been signed and placed on patient chart.  ER staff is aware of the admission:  [facility] ER Secretary    Dr. Quentin Cornwall, ER MD   Amy Patient's Nurse   [facility] Patient Access.

## 2018-02-02 NOTE — BHH Suicide Risk Assessment (Signed)
[facility] Admission Suicide Risk Assessment   Nursing information obtained from:    Demographic factors:    Current Mental Status:    Loss Factors:    Historical Factors:    Risk Reduction Factors:     Total Time spent with patient: 1 hour Principal Problem: Schizoaffective disorder, depressive type ([place]) Diagnosis:   Patient Active Problem List   Diagnosis Date Noted  . Schizoaffective disorder, depressive type ([place]) [F25.1] 02/02/2018    Priority: High  . Schizoaffective disorder, bipolar type ([place]) [F25.0] 11/06/2014    Priority: High  . Suicide attempt ([place]) [T14.91XA] 01/16/2018  . Dysfunctional uterine bleeding [N93.8] 01/06/2018  . Borderline personality disorder ([place]) [F60.3] 01/04/2018  . PTSD (post-traumatic stress disorder) [F43.10] 01/03/2018  . Suicidal ideation [R45.851] 01/02/2018  . Self-inflicted injury [D14.97] 11/23/2017  . ASCUS of cervix with negative high risk HPV [R87.610] 08/28/2017  . Malingering [Z76.5] 05/06/2016  . Foreign body aspiration [T17.900A]   . Other foreign object in esophagus causing other injury, subsequent encounter [T18.198D]   . Swallowed foreign body [T18.9XXA] 04/23/2016  . Foreign body ingestion [T18.9XXA] 04/21/2016  . Gastric foreign body [T18.2XXA]   . 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 Hypothyroidism [E03.9] 09/29/2014  . Hypertension [I10] 09/29/2014   Subjective Data: suicidal ideation  Continued Clinical Symptoms:  Alcohol Use Disorder Identification Test Final Score (AUDIT): 1 The "Alcohol Use Disorders Identification Test", Guidelines for Use in Primary Care, Second Edition.  World Pharmacologist [facility]). Score between 0-7:  no or low risk or alcohol related problems. Score between 8-15:  moderate risk of alcohol related problems. Score between 16-19:  high risk of alcohol related problems. Score 20 or above:  warrants further diagnostic evaluation for alcohol dependence and treatment.   CLINICAL FACTORS:   Depression:   Impulsivity Currently Psychotic   Musculoskeletal: Strength & Muscle Tone: within normal limits Gait & Station: normal Patient leans: N/A  Psychiatric Specialty Exam: Physical Exam  Nursing note and vitals reviewed. Psychiatric: Her speech is normal. Her affect is blunt. She is actively hallucinating. Cognition and memory are impaired. She expresses impulsivity. She exhibits a depressed mood. She expresses suicidal ideation. She expresses suicidal plans.    Review of Systems  Neurological: Negative.   Psychiatric/Behavioral: Positive for depression, hallucinations and suicidal ideas.  All other systems reviewed and are negative.   Blood pressure 117/77, pulse 86, temperature 98.7 F (37.1 C), temperature source Oral, resp. rate 18, height 5\' 6"  (1.676 m), weight 98 kg, last menstrual period 01/31/2018, SpO2 100 %.Body mass index is 34.86 kg/m.  General Appearance: Casual  Eye Contact:  Good  Speech:  Clear and Coherent  Volume:  Normal  Mood:  Depressed  Affect:  Flat  Thought Process:  Goal Directed and Descriptions of Associations: Intact  Orientation:  Full (Time, Place, and Person)  Thought Content:  Hallucinations: Auditory  Suicidal Thoughts:  Yes.  with intent/plan  Homicidal Thoughts:  No  Memory:  Immediate;   Fair Recent;   Fair Remote;   Fair  Judgement:  Poor  Insight:  Lacking  Psychomotor Activity:  Psychomotor Retardation  Concentration:  Concentration: Fair and Attention Span: Fair  Recall:  [employer] of Knowledge:  Fair  Language:  Fair  Akathisia:  No  Handed:  Right  AIMS (if indicated):     Assets:  Communication Skills Desire for Improvement Financial Resources/Insurance Housing Physical Health Resilience Social Support  ADL's:  Intact  Cognition:  WNL  Sleep:  COGNITIVE FEATURES THAT CONTRIBUTE TO RISK:  None    SUICIDE RISK:   Severe:  Frequent, intense, and enduring suicidal ideation, specific plan, no  subjective intent, but some objective markers of intent (i.e., choice of lethal method), the method is accessible, some limited preparatory behavior, evidence of impaired self-control, severe dysphoria/symptomatology, multiple risk factors present, and few if any protective factors, particularly a lack of social support.  PLAN OF CARE: hospital admission, medication management, discharge planning.  [name] is a 27 year old female with a history of depression, anxiety, mood instability, psychosis and self injurious behavior admitted for auditory hallucinations and suicidal ideation with a plan to cut.   #Suicidalmideation -patient able to contract for safety in the hospital  #Mood and psychosis -increase Clozapine to 200 mg nightly -continue Lithium 300 mg BID -increase Trileptal to 600 mg BID -continue Effexor 150 mg daily  #HTN -lisinopril 2.5 mg daily  #Hypothyroidism  -Synthroid 100 ug daily   #GERD -Protonix 40 mg daily  #Constipation -bowel regimen  #Social -incompetent adult -mother is the guardian  #Labs were obtained recently -pregnancy test is negative  #Disposition -discharge to her group home -follow up with CBC  I certify that inpatient services furnished can reasonably be expected to improve the patient's condition.   Orson Slick, MD 02/02/2018, 2:44 PM
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
## 2018-02-02 NOTE — Tx Team (Signed)
Initial Treatment Plan 02/02/2018 5:54 PM [name] RHZ:[id number]    PATIENT STRESSORS: Medication change or noncompliance Traumatic event   PATIENT STRENGTHS: Communication skills Motivation for treatment/growth Physical Health   PATIENT IDENTIFIED PROBLEMS: Auditory hallucinations 02/02/2018   Suicidal thoughts 02/02/2018                   DISCHARGE CRITERIA:  Adequate post-discharge living arrangements Medical problems require only outpatient monitoring Verbal commitment to aftercare and medication compliance  PRELIMINARY DISCHARGE PLAN: Attend aftercare/continuing care group Return to previous living arrangement  PATIENT/FAMILY INVOLVEMENT: This treatment plan has been presented to and reviewed with the patient, Melanie Bell, and/or family member,   The patient and family have been given the opportunity to ask questions and make suggestions.  Merlene Morse, RN 02/02/2018, 5:54 PM

## 2018-02-02 NOTE — Progress Notes (Signed)
Clozapine monitoring Consult   27 yo female ordered clozapine 200 mg PO daily  09/18  [place] [place] [address]  Information entered into Clozapine registry and pt eligible to receive clozapine Next labs due in a week - ordered for 02/07/2018  Pharmacy will continue to follow.   Prudy Feeler, RPh 02/02/2018 9:04 AM

## 2018-02-02 NOTE — ED Provider Notes (Signed)
-----------------------------------------   6:15 AM on 02/02/2018 -----------------------------------------   Blood pressure 105/73, pulse 100, temperature 98.2 F (36.8 C), temperature source Oral, resp. rate 18, height 5\' 6"  (1.676 m), weight 90.7 kg, last menstrual period 01/31/2018, SpO2 100 %.  The patient had no acute events since last update.  Calm and cooperative at this time.  I was told that it would be 9/21 at the earliest before bed would be available at her facility.    Paulette Blanch, MD 02/02/18 [phone]

## 2018-02-02 NOTE — BHH Group Notes (Signed)
[facility] Group Notes:  (Nursing/MHT/Case Management/Adjunct)  Date:  02/02/2018  Time:  9:39 PM  Type of Therapy:  Group Therapy  Participation Level:  Active  Participation Quality:  Appropriate  Affect:  Appropriate  Cognitive:  Alert  Insight:  Good  Engagement in Group:  Engaged  Modes of Intervention:  Support  Summary of Progress/Problems:  Melanie Bell 02/02/2018, 9:39 PM

## 2018-02-02 NOTE — Progress Notes (Signed)
Patient ID: Margel Joens, female   DOB: 1990/11/09, 27 y.o.   MRN: [id number] PER STATE REGULATIONS 482.30  THIS CHART WAS REVIEWED FOR MEDICAL NECESSITY WITH RESPECT TO THE PATIENT'S ADMISSION/DURATION OF STAY.  NEXT REVIEW DATE: 02/06/18  Roma Schanz, RN, BSN CASE MANAGER

## 2018-02-02 NOTE — Progress Notes (Signed)
Patient is sad and tearful but cooperative during admission assessment. Patient verbalized that she is positive for hearing voices telling her to kill herself.Patient contracts for safety. Patient informed of fall risk status, fall risk assessed "low" at this time. Patient oriented to unit/staff/room. Patient denies any questions/concerns at this time. Patient safe on unit with Q15 minute checks for safety. Skin assessment and body search done,no contraband found.

## 2018-02-02 NOTE — [facility]HH Group Notes (Signed)
02/02/2018 1PM  Type of Therapy and Topic:  Group Therapy:  Feelings around Relapse and Recovery  Participation Level:  Minimal   Description of Group:    Patients in this group will discuss emotions they experience before and after a relapse. They will process how experiencing these feelings, or avoidance of experiencing them, relates to having a relapse. Facilitator will guide patients to explore emotions they have related to recovery. Patients will be encouraged to process which emotions are more powerful. They will be guided to discuss the emotional reaction significant others in their lives may have to patients' relapse or recovery. Patients will be assisted in exploring ways to respond to the emotions of others without this contributing to a relapse.  Therapeutic Goals: 1. Patient will identify two or more emotions that lead to a relapse for them 2. Patient will identify two emotions that result when they relapse 3. Patient will identify two emotions related to recovery 4. Patient will demonstrate ability to communicate their needs through discussion and/or role plays   Summary of Patient Progress: Actively and appropriately engaged in the group. Patient practiced active listening when interacting with the facilitator and other group members. Melanie Bell came towards the end of group. She mentioned enjoying to read as a coping skill that she can use in her recovery. Patient is still in the process of obtaining treatment goals.      Therapeutic Modalities:   Cognitive Behavioral Therapy Solution-Focused Therapy Assertiveness Training Relapse Prevention Therapy   Darin Engels, [place] 02/02/2018 3:29 PM

## 2018-02-02 NOTE — ED Notes (Signed)
Pt discharged to BMU under IVC.  VS stable. Report given to [place], Therapist, sports. Belongings sent with patient.

## 2018-02-03 DIAGNOSIS — F4310 Post-traumatic stress disorder, unspecified: Secondary | ICD-10-CM
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
## 2018-02-03 NOTE — Plan of Care (Signed)
  Problem: Education: Goal: Utilization of techniques to improve thought processes will improve Outcome: Progressing  Patient thought process improving
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
## 2018-02-03 NOTE — [facility] Group Notes (Signed)
LCSW Group Therapy Note  02/03/2018 1:15pm  Type of Therapy and Topic: Group Therapy: Holding on to Grudges   Participation Level: Active   Description of Group:  In this group patients will be asked to explore and define a grudge. Patients will be guided to discuss their thoughts, feelings, and reasons as to why people have grudges. Patients will process the impact grudges have on daily life and identify thoughts and feelings related to holding grudges. Facilitator will challenge patients to identify ways to let go of grudges and the benefits this provides. Patients will be confronted to address why one struggles letting go of grudges. Lastly, patients will identify feelings and thoughts related to what life would look like without grudges. This group will be process-oriented, with patients participating in exploration of their own experiences, giving and receiving support, and processing challenge from other group members.  Therapeutic Goals:  1. Patient will identify specific grudges related to their personal life.  2. Patient will identify feelings, thoughts, and beliefs around grudges.  3. Patient will identify how one releases grudges appropriately.  4. Patient will identify situations where they could have let go of the grudge, but instead chose to hold on.   Summary of Patient Progress: The patient reported that she still not feeling any better. Patients were guided to discuss their thoughts, feelings, and reasons as to why people have grudges. The patient was able to process the impact grudges have on daily life and identified thoughts and feelings related to holding grudges. The patient was challenged to identify ways to let go of grudges and the benefits this provides. Pt. actively and appropriately engaged in the group. Patient was able to provide support and validation to other group members.   Therapeutic Modalities:  Cognitive Behavioral Therapy  Solution Focused Therapy   Motivational Interviewing  Brief Therapy   Toree Edling  CUEBAS-COLON, LCSW 02/03/2018 12:38 PM

## 2018-02-03 NOTE — Progress Notes (Signed)
Patient alert and oriented x 4, , her thoughts are organized and coherent. Patient denies SI/HI but endorses AH, she stated voices are command hallucination telling  her  to harm herself, but she contracts  for safety, more visual checks initiated.  Patient's affect is flat but brightens upon approach, she is interacting appropriately with peers and staff no distress noted..15 minutes safety checks maintained, will continue to monitor closely.
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
## 2018-02-04 NOTE — [facility] Group Notes (Signed)
LCSW Group Therapy Note 02/04/2018 1:15pm  Type of Therapy and Topic: Group Therapy: Feelings Around Returning Home & Establishing a Supportive Framework and Supporting Oneself When Supports Not Available  Participation Level: Active  Description of Group:  Patients first processed thoughts and feelings about upcoming discharge. These included fears of upcoming changes, lack of change, new living environments, judgements and expectations from others and overall stigma of mental health issues. The group then discussed the definition of a supportive framework, what that looks and feels like, and how do to discern it from an unhealthy non-supportive network. The group identified different types of supports as well as what to do when your family/friends are less than helpful or unavailable  Therapeutic Goals  1. Patient will identify one healthy supportive network that they can use at discharge. 2. Patient will identify one factor of a supportive framework and how to tell it from an unhealthy network. 3. Patient able to identify one coping skill to use when they do not have positive supports from others. 4. Patient will demonstrate ability to communicate their needs through discussion and/or role plays.  Summary of Patient Progress:  Patient scored her mood at a 6 (10.) She stated she feels better than yesterday. Pt engaged during group session. As patients processed their anxiety about discharge and described healthy supports patient shared she is not ready to be discharge. She states she still having the same issues when she came in but not as bad. Pt listed her family as her main support. Patients identified at least one self-care tool they were willing to use after discharge; journaling, praying, listening to music, arts and crafts.   Therapeutic Modalities Cognitive Behavioral Therapy Motivational Interviewing   Vincenzo Stave  CUEBAS-COLON, LCSW 02/04/2018 10:52 AM

## 2018-02-04 NOTE — Progress Notes (Signed)
D: Pt denies homicidal ideations, but continues to endorse passive suicidal thoughts, contracts for safety. Pt. Reports she can remain safe while on the unit. Pt. Endorses auditory command hallucinations that reportedly are, "my brother telling me to kill myself". Pt. Reports during assessments that she feels depressed, because she feels she is the reason she is back in the hospital again. Pt. Reports depression, "7/10" and anxiety, "4/10". Pt. Educated to come to staff if she begins to feel the urge to act on command hallucinations. Pt. Mood and affect presents sad and flat.   A: Q x 15 minute observation checks were completed for safety. Patient was provided with education, but needs reinforcement.  Patient was given/offered medications per orders. Patient  was encourage to attend groups, participate in unit activities and continue with plan of care. Pt. Chart and plans of care reviewed. Pt. Given support and encouragement.   R: Patient is complaint with medication and unit procedures. Pt. Participation with unit activities and snacks appropriate.             Precautionary checks every 15 minutes for safety maintained, room free of safety hazards, patient sustains no injury or falls during this shift. Will endorse care to next shift.

## 2018-02-04 NOTE — Plan of Care (Signed)
Pt. Complaint with medications. Pt. Participation with unit activities and groups appropriate. Pt. Monitored for safety by staff and reports she is able to remain safe while on the unit. Pt. Passive si, but contracts verbally for safety. Pt. This evening reportedly blames herself for why she is here. Pt. Needs reinforcement on provided education. Pt. Presents sad this evening with a depressed mood.    Problem: Education: Goal: Knowledge of [facility] Education information/materials will improve Outcome: Not Progressing Goal: Emotional status will improve Outcome: Not Progressing Goal: Mental status will improve Outcome: Not Progressing   Problem: Self-Concept: Goal: Will verbalize positive feelings about self Outcome: Not Progressing   Problem: Health Behavior/Discharge Planning: Goal: Compliance with treatment plan for underlying cause of condition will improve Outcome: Progressing   Problem: Activity: Goal: Interest or engagement in leisure activities will improve Outcome: Progressing   Problem: Safety: Goal: Ability to remain free from injury will improve Outcome: Progressing
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
## 2018-02-04 NOTE — Progress Notes (Signed)
Received Melanie Bell this AM in her room asleep, she lept through breakfast and woke up for lunch. She was compliant with her medications. She verbalized and rated  feeling anxious 8/10,   Depressed 7/10 ,with intervals of passive SI. She rated hopelessness 6/10. No change in her status this PM.

## 2018-02-05 NOTE — Plan of Care (Signed)
Pt. Report she can remain safe while on the unit. Pt. Denies SI/HI this evening and contracts for safety. Pt. Complaint with medications. Pt. Verbalizes understanding of provided education. Pt. Reports anxiety and depression still the same and is still having auditory hallucinations.   Problem: Education: Goal: Emotional status will improve Outcome: Not Progressing Goal: Mental status will improve Outcome: Not Progressing   Problem: Education: Goal: Knowledge of [place] [facility] Education information/materials will improve Outcome: Progressing   Problem: Health Behavior/Discharge Planning: Goal: Compliance with treatment plan for underlying cause of condition will improve Outcome: Progressing   Problem: Safety: Goal: Ability to remain free from injury will improve Outcome: Progressing

## 2018-02-05 NOTE — Progress Notes (Signed)
Recreation Therapy Notes  INPATIENT RECREATION THERAPY ASSESSMENT  Patient Details [name] MRN: [id number] DOB: 1990-08-09 Today's Date: 02/05/2018       Information Obtained From: Patient  Able to Participate in Assessment/Interview: Yes  Patient Presentation: Responsive  Reason for Admission (Per Patient): Active Symptoms, Suicidal Ideation, Other (Comments)(I don't feel safe at the group home)  Patient Stressors:    Coping Skills:   Journal, Music, Prayer  Leisure Interests (2+):  Art - Coloring, Art - Paint, Individual - Reading, Individual - Writing  Frequency of Recreation/Participation: Monthly  Awareness of Community Resources:  Yes  Community Resources:  Library  Current Use:    If no, Barriers?:    Expressed Interest in [place] of Residence:  [employer]  Patient Main Form of Transportation: Other (Comment)(ACT Team)  Patient Strengths:  caring  Patient Identified Areas of Improvement:  work on me  Patient Goal for Hospitalization:  Not feel like everythingis my fault  Current SI (including self-harm):  Yes  Current HI:  No  Current AVH: Yes  Staff Intervention Plan: Group Attendance, Collaborate with Interdisciplinary Treatment Team  Consent to Intern Participation: N/A  Jeet Shough 02/05/2018, 3:51 PM
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
## 2018-02-05 NOTE — Progress Notes (Signed)
Recreation Therapy Notes   Date: 02/05/2018  Time: 9:30 am  Location: Craft Room  Behavioral response: Appropriate  Intervention Topic: Problem Solving  Discussion/Intervention:  Group content on today was focused on problem solving. The group described what problem solving is. Patients expressed how problems affect them and how they deal with problems. Individuals identified healthy ways to deal with problems. Patients explained what normally happens to them when they do not deal with problems. The group expressed reoccurring problems for them. The group participated in the intervention "Ways to Solve problems" where patients were given a chance to explore different ways to solve problems.  Clinical Observations/Feedback:  Patient came to group late due to unknown reasons. Individual was social with peers and staff while participating in the intervention. Warda Mcqueary LRT/CTRS          Jajuan Skoog 02/05/2018 1:10 PM

## 2018-02-05 NOTE — Progress Notes (Signed)
D: Pt denies SI/HI, contracts for safety. Continues to endorse auditory hallucinations, but verbalizes she will come to staff if she feels urge to listen to command hallucinations. Pt is pleasant and cooperative, affect is sad. Pt. has no Complaints, continues to blame herself for admissions.  Patient interactions minimal, but appropriate. Pt. Reports depression and anxiety 6"10" for both.   A: Q x 15 minute observation checks were completed for safety. Patient was provided with education.  Patient was given/offered medications per orders. Patient  was encourage to attend groups, participate in unit activities and continue with plan of care. Pt. Chart and plans of care reviewed. Pt. Given support and encouragement.   R: Patient is complaint with medication and unit procedures.             Precautionary checks every 15 minutes for safety maintained, room free of safety hazards, patient sustains no injury or falls during this shift. Will endorse care to next shift.

## 2018-02-05 NOTE — Tx Team (Addendum)
Interdisciplinary Treatment and Diagnostic Plan Update  02/05/2018 Time of Session: 10:30am Melanie Bell MRN: [id number]  Principal Diagnosis: Schizoaffective disorder, depressive type [facility])  Secondary Diagnoses: Principal Problem:   Schizoaffective disorder, depressive type ([place]) Active Problems:   Hypothyroidism   Suicidal ideation   PTSD (post-traumatic stress disorder)   Current Medications:  Current Facility-Administered Medications  Medication Dose Route Frequency Provider Last Rate Last Dose  . acetaminophen (TYLENOL) tablet 650 mg  650 mg Oral Q6H PRN [name], [name], MD      . alum & mag hydroxide-simeth (MAALOX/MYLANTA) 200-200-20 MG/5ML suspension 30 mL  30 mL Oral Q4H PRN [name], [name], MD      . amLODipine (NORVASC) tablet 2.5 mg  2.5 mg Oral Daily [name], [name], MD   2.5 mg at 02/05/18 0936  . cloZAPine (CLOZARIL) tablet 250 mg  250 mg Oral QHS [name], [name], MD   250 mg at 02/04/18 2130  . docusate sodium (COLACE) capsule 100 mg  100 mg Oral BID [name], [name], MD   100 mg at 02/05/18 0935  . levothyroxine (SYNTHROID, LEVOTHROID) tablet 100 mcg  100 mcg Oral QAC breakfast [name], [name], MD   100 mcg at 02/05/18 0935  . lithium carbonate capsule 300 mg  300 mg Oral BID WC [name], [name], MD   300 mg at 02/05/18 0934  . loratadine (CLARITIN) tablet 10 mg  10 mg Oral Daily [name], [name], MD   10 mg at 02/05/18 0934  . magnesium hydroxide (MILK OF MAGNESIA) suspension 30 mL  30 mL Oral Daily PRN [name], [name], MD      . Oxcarbazepine (TRILEPTAL) tablet 600 mg  600 mg Oral BID [name], [name], MD   600 mg at 02/05/18 0933  . pantoprazole (PROTONIX) EC tablet 40 mg  40 mg Oral Daily [name], [name], MD   40 mg at 02/05/18 0937  . prazosin (MINIPRESS) capsule 2 mg  2 mg Oral QHS [name], [name], MD   2 mg at 02/04/18 2131  . traZODone (DESYREL) tablet 100 mg  100 mg Oral QHS [name], [name], MD   100 mg at 02/04/18 2131  . venlafaxine XR  (EFFEXOR-XR) 24 hr capsule 150 mg  150 mg Oral Q breakfast [name], [name], MD   150 mg at 02/05/18 2353   PTA Medications: Medications Prior to Admission  Medication Sig Dispense Refill Last Dose  . amLODipine (NORVASC) 2.5 MG tablet Take 2.5 mg by mouth daily.   Unknown at Unknown  . cetirizine (ZYRTEC) 10 MG tablet Take 1 tablet (10 mg total) by mouth daily as needed for allergies. 30 tablet 1 Unknown at PRN  . cloZAPine (CLOZARIL) 25 MG tablet Take 7 tablets (175 mg total) by mouth at bedtime. 210 tablet 0 Unknown at Unknown  . docusate sodium (COLACE) 100 MG capsule Take 1 capsule (100 mg total) by mouth daily as needed. (Patient taking differently: Take 100 mg by mouth daily as needed for mild constipation or moderate constipation. ) 30 capsule 0 Unknown at PRN  . hydrOXYzine (ATARAX/VISTARIL) 50 MG tablet Take 1 tablet (50 mg total) by mouth 2 (two) times daily as needed. 90 tablet 1 Unknown at PRN  . levothyroxine (SYNTHROID, LEVOTHROID) 100 MCG tablet Take 1 tablet (100 mcg total) by mouth daily before breakfast. 30 tablet 1 Unknown at Unknown  . lithium carbonate 300 MG capsule Take 1 capsule (300 mg total) by mouth 2 (two) times daily  with a meal. 60 capsule 1 Unknown at Unknown  . medroxyPROGESTERone (DEPO-PROVERA) 150 MG/ML injection Inject 1 mL (150 mg total) into the muscle every 3 (three) months. 1 mL 1 As directed at As directed  . omeprazole (PRILOSEC) 20 MG capsule Take 1 capsule (20 mg total) by mouth daily. 30 capsule 1 Unknown at Unknown  . OXcarbazepine (TRILEPTAL) 300 MG tablet Take 1 tablet (300 mg total) by mouth 2 (two) times daily. 60 tablet 1 Unknown at Unknown  . polyethylene glycol (MIRALAX / GLYCOLAX) packet Take 17 g by mouth daily as needed. 30 each 1 Unknown at PRN  . prazosin (MINIPRESS) 2 MG capsule Take 1 capsule (2 mg total) by mouth at bedtime. 30 capsule 0 Unknown at Unknown  . senna (SENOKOT) 8.6 MG TABS tablet Take 1 tablet (8.6 mg total) by mouth daily as  needed for mild constipation. 120 each 0 Unknown at PRN  . traZODone (DESYREL) 50 MG tablet Take 1 tablet (50 mg total) by mouth at bedtime. 30 tablet 1 Unknown at Unknown  . venlafaxine XR (EFFEXOR-XR) 150 MG 24 hr capsule Take 1 capsule (150 mg total) by mouth daily with breakfast. 30 capsule 1 Unknown at Unknown  . Vitamin D, Ergocalciferol, (DRISDOL) 50000 units CAPS capsule Take 1 capsule (50,000 Units total) by mouth every 7 (seven) days. 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   Physician Treatment Plan for Primary Diagnosis: Schizoaffective disorder, depressive type ([place]) Long Term Goal(s): Improvement in symptoms so as ready for discharge Improvement in symptoms so as ready for discharge   Short Term Goals: Ability to identify changes in lifestyle to reduce recurrence of condition will improve Ability to verbalize feelings will improve Ability to disclose and discuss suicidal ideas Ability to demonstrate self-control will improve Ability to identify and develop effective coping behaviors will improve Ability to maintain clinical measurements within normal limits will improve Ability to identify triggers associated with substance abuse/mental health issues will improve NA  Medication Management: Evaluate patient's response, side effects, and tolerance of medication regimen.  Therapeutic Interventions: 1 to 1 sessions, Unit Group sessions and Medication administration.  Evaluation of Outcomes: Progressing  Physician Treatment Plan for Secondary Diagnosis: Principal Problem:   Schizoaffective disorder, depressive type ([place]) Active Problems:   Hypothyroidism   Suicidal  ideation   PTSD (post-traumatic stress disorder)  Long Term Goal(s): Improvement in symptoms so as ready for discharge Improvement in symptoms so as ready for discharge   Short Term Goals: Ability to identify changes in lifestyle to reduce recurrence of condition will improve Ability to verbalize feelings will improve Ability to disclose and discuss suicidal ideas Ability to demonstrate self-control will improve Ability to identify and develop effective coping behaviors will improve Ability to maintain clinical measurements within normal limits will improve Ability to identify triggers associated with substance abuse/mental health issues will improve NA     Medication Management: Evaluate patient's response, side effects, and tolerance of medication regimen.  Therapeutic Interventions: 1 to 1 sessions, Unit Group sessions and Medication administration.  Evaluation of Outcomes: Progressing   RN Treatment Plan for Primary Diagnosis: Schizoaffective disorder, depressive type ([place]) Long Term Goal(s): Knowledge of disease and therapeutic regimen to maintain health will improve  Short Term Goals: Ability to identify and  develop effective coping behaviors will improve and Compliance with prescribed medications will improve  Medication Management: RN will administer medications as ordered by provider, will assess and evaluate patient's response and provide education to patient for prescribed medication. RN will report any adverse and/or side effects to prescribing provider.  Therapeutic Interventions: 1 on 1 counseling sessions, Psychoeducation, Medication administration, Evaluate responses to treatment, Monitor vital signs and CBGs as ordered, Perform/monitor CIWA, COWS, AIMS and Fall Risk screenings as ordered, Perform wound care treatments as ordered.  Evaluation of Outcomes: Progressing   LCSW Treatment Plan for Primary Diagnosis: Schizoaffective disorder, depressive type ([place]) Long  Term Goal(s): Safe transition to appropriate next level of care at discharge, Engage patient in therapeutic group addressing interpersonal concerns.  Short Term Goals: Engage patient in aftercare planning with referrals and resources, Identify triggers associated with mental health/substance abuse issues and Increase skills for wellness and recovery  Therapeutic Interventions: Assess for all discharge needs, 1 to 1 time with Social worker, Explore available resources and support systems, Assess for adequacy in community support network, Educate family and significant other(s) on suicide prevention, Complete Psychosocial Assessment, Interpersonal group therapy.  Evaluation of Outcomes: Progressing   Progress in Treatment: Attending groups: Yes. Participating in groups: Yes. Taking medication as prescribed: Yes. Toleration medication: Yes. Family/Significant other contact made: No, will contact:  mother Patient understands diagnosis: Yes. Discussing patient identified problems/goals with staff: Yes. Medical problems stabilized or resolved: Yes. Denies suicidal/homicidal ideation: Yes. Issues/concerns per patient self-inventory: No. Other:    New problem(s) identified: No, Describe:     New Short Term/Long Term Goal(s):  Patient Goals:  Improve depression  Discharge Plan or Barriers: Discharge home to Group home and follow up with [facility] ACTT team  Reason for Continuation of Hospitalization: Anxiety Depression Medication stabilization  Estimated Length of Stay: 3-5 days  Recreational Therapy: Patient Stressors: N/A Patient Goal: Patient will engage in groups without prompting or encouragement from LRT x3 group sessions within 5 recreation therapy group sessions  Attendees: Patient:Melanie Bell 02/05/2018 5:45 PM  Physician: Herma Ard [name] 02/05/2018 5:45 PM  Nursing:  02/05/2018 5:45 PM  RN Care Manager: 02/05/2018 5:45 PM  Social Worker: Dossie Arbour, LCSW 02/05/2018 5:45 PM   Recreational Therapist: Roanna Epley, LRT 02/05/2018 5:45 PM  Other: Darin Engels, [place] 02/05/2018 5:45 PM  Other: Marney Doctor, Chaplain 02/05/2018 5:45 PM  Other: 02/05/2018 5:45 PM    Scribe for Treatment Team: [name], LCSW 02/05/2018 5:45 PM

## 2018-02-05 NOTE — [facility] Group Notes (Signed)
[place] Group Notes:  (Nursing/MHT/Case Management/Adjunct)  Date:  02/05/2018  Time:  9:21 PM  Type of Therapy:  Group Therapy  Participation Level:  Active  Participation Quality:  Appropriate  Affect:  Appropriate  Cognitive:  Alert  Insight:  Good  Engagement in Group:  Engaged  Modes of Intervention:  Support  Summary of Progress/Problems:  Melanie Bell 02/05/2018, 9:21 PM

## 2018-02-05 NOTE — Progress Notes (Signed)
   02/05/18 1030  Clinical Encounter Type  Visited With Patient  Visit Type Initial;Spiritual support;Behavioral Health  Referral From Social work  Consult/Referral To Chaplain  Spiritual Encounters  Spiritual Needs Emotional;Other (Comment)   [place] attended the patient's treatment team meeting. [name] stated that she was struggling. She did not verify what she was struggling with. She wants to have her placement changed. She does not like where she currently resides. [name] also shared that she was having a difficult time memories of a friend that she saw kill herself when she was 37 years old. The patient's goal is to get better.

## 2018-02-06 NOTE — Progress Notes (Signed)
[facility] MD Progress Note  02/06/2018 6:46 AM Melanie Bell  MRN:  [id number]  Subjective:    [name] reports that her voices are "alittle better' but still suicidal. She is able to contract for safety in the hospital. She wanted to review her medications. She is on Lithium, Trileptal, Effexor and Clozapine. Her clozapine was recently increased to 250 mg and we switched it to night time. Trileptal was increased to 600 mg BID. She tolerates medications well.   We will check Clozapine level tonight.  Principal Problem: Schizoaffective disorder, depressive type ([place]) Diagnosis:   Patient Active Problem List   Diagnosis Date Noted  . Schizoaffective disorder, depressive type ([place]) [F25.1] 02/02/2018    Priority: High  . Schizoaffective disorder, bipolar type ([place]) [F25.0] 11/06/2014    Priority: High  . Suicide attempt ([place]) [T14.91XA] 01/16/2018  . Dysfunctional uterine bleeding [N93.8] 01/06/2018  . Borderline personality disorder ([place]) [F60.3] 01/04/2018  . PTSD (post-traumatic stress disorder) [F43.10] 01/03/2018  . Suicidal ideation [R45.851] 01/02/2018  . Self-inflicted injury [F09.32] 11/23/2017  . ASCUS of cervix with negative high risk HPV [R87.610] 08/28/2017  . Malingering [Z76.5] 05/06/2016  . Foreign body aspiration [T17.900A]   . Other foreign object in esophagus causing other injury, subsequent encounter [T18.198D]   . Swallowed foreign body [T18.9XXA] 04/23/2016  . Foreign body ingestion [T18.9XXA] 04/21/2016  . Gastric foreign body [T18.2XXA]   . 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 Hypothyroidism [E03.9] 09/29/2014  . Hypertension [I10] 09/29/2014   Total Time spent with patient: 20 minutes  Past Psychiatric History: schizoaffective disorder  Past Medical History:  Past Medical History:  Diagnosis Date  . Anxiety   . Asthma   . Depression   . GERD (gastroesophageal reflux disease)   . Hallucinations 09/30/2014   Sizoaffective  . Hyperlipidemia   .  Hypertension   . Intentional ingestion of batteries 04/21/2016    2 AAA batteries and 1 thumb tack; intent to hurt herself/notes 04/21/2016  . Tardive dyskinesia 10/2014   recent onset    Past Surgical History:  Procedure Laterality Date  . ABDOMINAL SURGERY     "years ago" to remove foreign objects  . APPENDECTOMY    . BREAST EXCISIONAL BIOPSY Right 09/03/2007   surgical bx removed fibroadeoma  . BREAST LUMPECTOMY Right   . COLONOSCOPY WITH PROPOFOL N/A 09/10/2015   Procedure: COLONOSCOPY WITH PROPOFOL;  Surgeon: [name], MD;  Location: [facility] ENDOSCOPY;  Service: Endoscopy;  Laterality: N/A;  . ESOPHAGOGASTRODUODENOSCOPY N/A 11/28/2014   Procedure: ESOPHAGOGASTRODUODENOSCOPY (EGD);  Surgeon: [name], MD;  Location: [facility] ENDOSCOPY;  Service: Endoscopy;  Laterality: N/A;  . ESOPHAGOGASTRODUODENOSCOPY N/A 02/21/2016   Procedure: ESOPHAGOGASTRODUODENOSCOPY (EGD);  Surgeon: Mauri Pole, MD;  Location: [facility] ENDOSCOPY;  Service: Endoscopy;  Laterality: N/A;  . ESOPHAGOGASTRODUODENOSCOPY N/A 04/21/2016   Procedure: ESOPHAGOGASTRODUODENOSCOPY (EGD);  Surgeon: Gatha Mayer, MD;  Location: [facility] ENDOSCOPY;  Service: Endoscopy;  Laterality: N/A;  . ESOPHAGOGASTRODUODENOSCOPY N/A 05/06/2016   Procedure: ESOPHAGOGASTRODUODENOSCOPY (EGD);  Surgeon: Jonathon Bellows, MD;  Location: [facility] ENDOSCOPY;  Service: Gastroenterology;  Laterality: N/A;  . ESOPHAGOGASTRODUODENOSCOPY N/A 06/13/2016   Procedure: ESOPHAGOGASTRODUODENOSCOPY (EGD);  Surgeon: [name], MD;  Location: [facility] ENDOSCOPY;  Service: Endoscopy;  Laterality: N/A;  . ESOPHAGOGASTRODUODENOSCOPY (EGD) WITH PROPOFOL N/A 02/29/2016   Procedure: ESOPHAGOGASTRODUODENOSCOPY (EGD) WITH PROPOFOL;  Surgeon: [name], MD;  Location: [facility] ENDOSCOPY;  Service: Endoscopy;  Laterality: N/A;  . ESOPHAGOGASTRODUODENOSCOPY (EGD) WITH PROPOFOL N/A 01/15/2018   Procedure: ESOPHAGOGASTRODUODENOSCOPY (EGD) WITH PROPOFOL;  Surgeon: Gala Romney,  Cristopher Estimable, MD;   Location: [facility] ENDO SUITE;  Service: Endoscopy;  Laterality: N/A;  retrieval forein body  . LAPAROTOMY N/A 09/12/2015   Procedure: EXPLORATORY LAPAROTOMY;  Surgeon: Florene Glen, MD;  Location: [facility] ORS;  Service: General;  Laterality: N/A;  . SIGMOIDOSCOPY N/A 09/12/2015   Procedure: [place] Face;  Surgeon: Florene Glen, MD;  Location: [facility] ORS;  Service: General;  Laterality: N/A;  . WISDOM TOOTH EXTRACTION     Family History:  Family History  Problem Relation Age of Onset  . Depression Mother   . Hypertension Mother   . Sleep apnea Mother   . Asthma Mother   . COPD Mother   . Diabetes Mother   . Breast cancer Maternal Aunt        20's   Family Psychiatric  History: depression Social History:  Social History   Substance and Sexual Activity  Alcohol Use No     Social History   Substance and Sexual Activity  Drug Use No    Social History   Socioeconomic History  . Marital status: Single    Spouse name: Not on file  . Number of children: Not on file  . Years of education: Not on file  . Highest education level: Not on file  Occupational History  . Not on file  Social Needs  . Financial resource strain: Not on file  . Food insecurity:    Worry: Not on file    Inability: Not on file  . Transportation needs:    Medical: Not on file    Non-medical: Not on file  Tobacco Use  . Smoking status: Former Smoker    Packs/day: 0.50    Years: 3.00    Pack years: 1.50    Types: Cigarettes    Last attempt to quit: 08/2016    Years since quitting: 1.4  . Smokeless tobacco: Never Used  Substance and Sexual Activity  . Alcohol use: No  . Drug use: No  . Sexual activity: Never    Birth control/protection: Injection  Lifestyle  . Physical activity:    Days per week: Not on file    Minutes per session: Not on file  . Stress: Not on file  Relationships  . Social connections:    Talks on phone: Not on file    Gets together: Not on file    Attends religious  service: Not on file    Active member of club or organization: Not on file    Attends meetings of clubs or organizations: Not on file    Relationship status: Not on file  Other Topics Concern  . Not on file  Social History Narrative  . Not on file   Additional Social History:                         Sleep: Fair  Appetite:  Fair  Current Medications: Current Facility-Administered Medications  Medication Dose Route Frequency Provider Last Rate Last Dose  . acetaminophen (TYLENOL) tablet 650 mg  650 mg Oral Q6H PRN [name], [name], MD      . alum & mag hydroxide-simeth (MAALOX/MYLANTA) 200-200-20 MG/5ML suspension 30 mL  30 mL Oral Q4H PRN [name], [name], MD      . amLODipine (NORVASC) tablet 2.5 mg  2.5 mg Oral Daily [name], [name], MD   2.5 mg at 02/05/18 0936  . cloZAPine (CLOZARIL) tablet 250 mg  250 mg Oral QHS Aamori Mcmasters B,  MD   250 mg at 02/05/18 2112  . docusate sodium (COLACE) capsule 100 mg  100 mg Oral BID [name], [name], MD   100 mg at 02/05/18 1800  . levothyroxine (SYNTHROID, LEVOTHROID) tablet 100 mcg  100 mcg Oral QAC breakfast [name], [name], MD   100 mcg at 02/05/18 0935  . lithium carbonate capsule 300 mg  300 mg Oral BID WC [name], [name], MD   300 mg at 02/05/18 1800  . loratadine (CLARITIN) tablet 10 mg  10 mg Oral Daily [name], [name], MD   10 mg at 02/05/18 0934  . magnesium hydroxide (MILK OF MAGNESIA) suspension 30 mL  30 mL Oral Daily PRN [name], [name], MD      . Oxcarbazepine (TRILEPTAL) tablet 600 mg  600 mg Oral BID Marshawn Ninneman B, MD   600 mg at 02/05/18 1800  . pantoprazole (PROTONIX) EC tablet 40 mg  40 mg Oral Daily [name], [name], MD   40 mg at 02/05/18 0937  . prazosin (MINIPRESS) capsule 2 mg  2 mg Oral QHS [name], [name], MD   2 mg at 02/05/18 2112  . traZODone (DESYREL) tablet 100 mg  100 mg Oral QHS [name], [name], MD   100 mg at 02/05/18 2112  . venlafaxine XR (EFFEXOR-XR) 24 hr capsule 150 mg  150 mg Oral Q  breakfast [name], [name], MD   150 mg at 02/05/18 5784    Lab Results: No results found for this or any previous visit (from the past 48 hour(s)).  Blood Alcohol level:  Lab Results  Component Value Date   ETH <10 01/31/2018   ETH <10 69/62/9528    Metabolic Disorder Labs: Lab Results  Component Value Date   HGBA1C 5.1 11/23/2017   MPG 99.67 11/23/2017   MPG 103 03/03/2016   Lab Results  Component Value Date   PROLACTIN 64.6 (H) 03/04/2016   PROLACTIN 15.8 06/16/2015   Lab Results  Component Value Date   CHOL 173 11/23/2017   TRIG 77 11/23/2017   HDL 41 11/23/2017   CHOLHDL 4.2 11/23/2017   VLDL 15 11/23/2017   LDLCALC 117 (H) 11/23/2017   LDLCALC 127 (H) 03/03/2016    Physical Findings: AIMS:  , ,  ,  ,    CIWA:    COWS:     Musculoskeletal: Strength & Muscle Tone: within normal limits Gait & Station: normal Patient leans: N/A  Psychiatric Specialty Exam: Physical Exam  Nursing note and vitals reviewed. Psychiatric: Her speech is normal. Her mood appears anxious. She is withdrawn and actively hallucinating. Thought content is paranoid and delusional. Cognition and memory are impaired. She expresses impulsivity. She exhibits a depressed mood.    Review of Systems  Neurological: Negative.   Psychiatric/Behavioral: Positive for depression, hallucinations and suicidal ideas.  All other systems reviewed and are negative.   Blood pressure 107/69, pulse 77, temperature 98.4 F (36.9 C), temperature source Oral, resp. rate 17, height 5\' 6"  (1.676 m), weight 98 kg, last menstrual period 01/31/2018, SpO2 100 %.Body mass index is 34.86 kg/m.  General Appearance: Casual  Eye Contact:  Good  Speech:  Clear and Coherent  Volume:  Normal  Mood:  Depressed  Affect:  Flat  Thought Process:  Goal Directed and Descriptions of Associations: Intact  Orientation:  Full (Time, Place, and Person)  Thought Content:  WDL  Suicidal Thoughts:  Yes.  with intent/plan   Homicidal Thoughts:  No  Memory:  Immediate;  Fair Recent;   Fair Remote;   Fair  Judgement:  Poor  Insight:  Lacking  Psychomotor Activity:  Psychomotor Retardation  Concentration:  Concentration: Fair and Attention Span: Fair  Recall:  [employer] of Knowledge:  Fair  Language:  Fair  Akathisia:  No  Handed:  Right  AIMS (if indicated):     Assets:  Communication Skills Desire for Improvement Financial Resources/Insurance Housing Physical Health Resilience Social Support  ADL's:  Intact  Cognition:  WNL  Sleep:  Number of Hours: 7.15     Treatment Plan Summary: Daily contact with patient to assess and evaluate symptoms and progress in treatment and Medication management   [name] is a 27 year old female with a history of depression, anxiety, mood instability, psychosis and self injurious behavior admitted for auditory hallucinations and suicidal ideation with a plan to cut.   #Suicidal ideation, still suicidal -patient able to contract for safety in the hospital  #Mood and psychosis, still hallucinations -continue Clozapine 250 mg nightly, level tonight -continue Lithium 300 mg BID -continue Trileptal 600 mg BID -continue Effexor 150 mg daily  #PTSD -Minipress 2 mg nightly  #HTN -lisinopril 2.5 mg daily  #Hypothyroidism  -Synthroid 100 ug daily   #GERD -Protonix 40 mg daily  #Constipation -bowel regimen  #Social -incompetent adult -mother is the guardian  #Labs were obtained recently -pregnancy test is negative  #Disposition -discharge to her group home -follow up with [facility]  Orson Slick, MD 02/06/2018, 6:46 AM

## 2018-02-06 NOTE — B[facility] Group Notes (Signed)
[place] LCSW Group Therapy Note  Date/Time: 02/06/18, 1300  Type of Therapy/Topic:  Group Therapy:  Feelings about Diagnosis  Participation Level:  Active   Mood:pleasant   Description of Group:    This group will allow patients to explore their thoughts and feelings about diagnoses they have received. Patients will be guided to explore their level of understanding and acceptance of these diagnoses. Facilitator will encourage patients to process their thoughts and feelings about the reactions of others to their diagnosis, and will guide patients in identifying ways to discuss their diagnosis with significant others in their lives. This group will be process-oriented, with patients participating in exploration of their own experiences as well as giving and receiving support and challenge from other group members.   Therapeutic Goals: 1. Patient will demonstrate understanding of diagnosis as evidence by identifying two or more symptoms of the disorder:  2. Patient will be able to express two feelings regarding the diagnosis 3. Patient will demonstrate ability to communicate their needs through discussion and/or role plays  Summary of Patient Progress: Pt attentive during group where symptoms of bipolar disorder were discussed.  Pt made several comments during the discussion.        Therapeutic Modalities:   Cognitive Behavioral Therapy Brief Therapy Feelings Identification   Lurline Idol, LCSW

## 2018-02-06 NOTE — Progress Notes (Signed)
D: Patient stated slept good last night .Stated appetite is good and energy level  Is normal. Stated concentration is good . Stated on Depression scale 5 , hopeless 2 and anxiety 4 .( low 0-10 high) Denies suicidal  homicidal ideations  .  No auditory hallucinations  No pain concerns . Appropriate ADL'S. Interacting with peers and staff. Verbalize understanding of information received  concerning medications Patient  aware of information received from  staff referring to  education about unit programing .  Able to verbalize understanding.Attending unit programing , verbalizing feelings somatic  complaints Voice no concerns around  sleep.  Voice no safety concerns .    Emotional and mental health status improving . Working on Radiographer, therapeutic. Patient  Reported she was at [employer] a couple of weeks ago  And had unprotected sex with  A female individual . Felt the need to tell writer waiting on her period .  A: Encourage patient participation with unit programming . Instruction  Given on  Medication , verbalize understanding. R: Voice no other concerns. Staff continue to monitor

## 2018-02-06 NOTE — Progress Notes (Signed)
D: Pt denies SI/HI, contracts for safety. Pt is pleasant and cooperative, affect still sad and depressed. Pt. has no Complaints.  Patient Interaction appropriate, but minimal. Pt. Appropriate with staff and peers. Pt. Report this evening, "the voices are less loud" today. Pt. Reports depression and anxiety, "5/10" for both. Reports feeling virtually the same as yesterday. Continues to blame herself.   A: Q x 15 minute observation checks were completed for safety. Patient was provided with education.  Patient was given/offered medications per orders. Patient  was encourage to attend groups, participate in unit activities and continue with plan of care. Pt. Chart and plans of care reviewed. Pt. Given support and encouragement.   R: Patient is complaint with medication and unit procedures.             Precautionary checks every 15 minutes for safety maintained, room free of safety hazards, patient sustains no injury or falls during this shift. Will endorse care to next shift.

## 2018-02-06 NOTE — Progress Notes (Signed)
Patient ID: Melanie Bell, female   DOB: 06/28/1990, 27 y.o.   MRN: [id number] PER STATE REGULATIONS 482.30  THIS CHART WAS REVIEWED FOR MEDICAL NECESSITY WITH RESPECT TO THE PATIENT'S ADMISSION/ DURATION OF STAY.  NEXT REVIEW DATE: 02/10/2018  Chauncy Lean, RN, BSN CASE MANAGER

## 2018-02-06 NOTE — Progress Notes (Addendum)
Recreation Therapy Notes  Date: 02/06/2018  Time: 9:30 am  Location: Craft Room  Behavioral response: Appropriate  Intervention Topic: Emotions  Discussion/Intervention:  Group content on today was focused on emotions. The group identified what emotions are and why it is important to have emotions. Patients expressed some positive and negative emotions. Individuals gave some past experiences on how they normally dealt with emotions in the past. The group described some positive ways to deal with emotions in the future. Patients participated in the intervention "The Situation" where individuals were given a chance to respond to certain situations involving their emotions.  Clinical Observations/Feedback:  Patient came to group and stated feeling lead to actions. She identified she journals to express her emtions. Individual was social with peers and staff while participating in the intervention. Kaliana Albino LRT/CTRS        Irelynd Zumstein 02/06/2018 11:09 AM

## 2018-02-06 NOTE — Plan of Care (Signed)
Verbalize understanding of information received  concerning medications Patient  aware of information received from  staff referring to  education about unit programing .  Able to verbalize understanding.Attending unit programing , verbalizing feelings somatic  complaints Voice no concerns around  sleep.  Voice no safety concerns .    Emotional and mental health status improving . Working on Radiographer, therapeutic.  Problem: Education: Goal: Knowledge of [facility] Education information/materials will improve Outcome: Progressing Goal: Emotional status will improve Outcome: Progressing Goal: Mental status will improve Outcome: Progressing Goal: Verbalization of understanding the information provided will improve Outcome: Progressing   Problem: Health Behavior/Discharge Planning: Goal: Identification of resources available to assist in meeting health care needs will improve Outcome: Progressing Goal: Compliance with treatment plan for underlying cause of condition will improve Outcome: Progressing   Problem: Education: Goal: Utilization of techniques to improve thought processes will improve Outcome: Progressing Goal: Knowledge of the prescribed therapeutic regimen will improve Outcome: Progressing   Problem: Activity: Goal: Interest or engagement in leisure activities will improve Outcome: Progressing Goal: Imbalance in normal sleep/wake cycle will improve Outcome: Progressing   Problem: Coping: Goal: Coping ability will improve Outcome: Progressing Goal: Will verbalize feelings Outcome: Progressing   Problem: Self-Concept: Goal: Will verbalize positive feelings about self Outcome: Progressing Goal: Level of anxiety will decrease Outcome: Progressing   Problem: Safety: Goal: Ability to remain free from injury will improve Outcome: Progressing

## 2018-02-06 NOTE — Plan of Care (Signed)
Pt. Complaint with medications. Pt. Denies SI/HI, contracts for safety. Pt. Reports depression and anxiety is virtually the same. Pt. Does give number value of, "5/10" for anxiety and depression.    Problem: Education: Goal: Emotional status will improve Outcome: Not Progressing Goal: Mental status will improve Outcome: Not Progressing   Problem: Health Behavior/Discharge Planning: Goal: Compliance with treatment plan for underlying cause of condition will improve Outcome: Progressing   Problem: Safety: Goal: Ability to remain free from injury will improve Outcome: Progressing

## 2018-02-07 NOTE — Progress Notes (Signed)
[facility] MD Progress Note  02/07/2018 3:04 PM Melanie Bell  MRN:  [id number]  Subjective:    [name] still has auditory command hallucinations telling her to kill herself. She is able to contract for safety. She was worried about her lab results but they will not be known for a couple of days (Clozapine level).  She reports that she had sex, apparently consensual, during her recent hospitalization at [facility]. Staff aware.   Principal Problem: Schizoaffective disorder, depressive type ([place]) Diagnosis:   Patient Active Problem List   Diagnosis Date Noted  . Schizoaffective disorder, depressive type ([place]) [F25.1] 02/02/2018    Priority: High  . Schizoaffective disorder, bipolar type ([place]) [F25.0] 11/06/2014    Priority: High  . Suicide attempt ([place]) [T14.91XA] 01/16/2018  . Dysfunctional uterine bleeding [N93.8] 01/06/2018  . Borderline personality disorder ([place]) [F60.3] 01/04/2018  . PTSD (post-traumatic stress disorder) [F43.10] 01/03/2018  . Suicidal ideation [R45.851] 01/02/2018  . Self-inflicted injury [K24.09] 11/23/2017  . ASCUS of cervix with negative high risk HPV [R87.610] 08/28/2017  . Malingering [Z76.5] 05/06/2016  . Foreign body aspiration [T17.900A]   . Other foreign object in esophagus causing other injury, subsequent encounter [T18.198D]   . Swallowed foreign body [T18.9XXA] 04/23/2016  . Foreign body ingestion [T18.9XXA] 04/21/2016  . Gastric foreign body [T18.2XXA]   . 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 Hypothyroidism [E03.9] 09/29/2014  . Hypertension [I10] 09/29/2014   Total Time spent with patient: 15 minutes  Past Psychiatric History: schizoaffective disorder.  Past Medical History:  Past Medical History:  Diagnosis Date  . Anxiety   . Asthma   . Depression   . GERD (gastroesophageal reflux disease)   . Hallucinations 09/30/2014   Sizoaffective  . Hyperlipidemia   . Hypertension   . Intentional ingestion of batteries 04/21/2016    2 AAA  batteries and 1 thumb tack; intent to hurt herself/notes 04/21/2016  . Tardive dyskinesia 10/2014   recent onset    Past Surgical History:  Procedure Laterality Date  . ABDOMINAL SURGERY     "years ago" to remove foreign objects  . APPENDECTOMY    . BREAST EXCISIONAL BIOPSY Right 09/03/2007   surgical bx removed fibroadeoma  . BREAST LUMPECTOMY Right   . COLONOSCOPY WITH PROPOFOL N/A 09/10/2015   Procedure: COLONOSCOPY WITH PROPOFOL;  Surgeon: [name], MD;  Location: [facility] ENDOSCOPY;  Service: Endoscopy;  Laterality: N/A;  . ESOPHAGOGASTRODUODENOSCOPY N/A 11/28/2014   Procedure: ESOPHAGOGASTRODUODENOSCOPY (EGD);  Surgeon: [name], MD;  Location: [facility] ENDOSCOPY;  Service: Endoscopy;  Laterality: N/A;  . ESOPHAGOGASTRODUODENOSCOPY N/A 02/21/2016   Procedure: ESOPHAGOGASTRODUODENOSCOPY (EGD);  Surgeon: Mauri Pole, MD;  Location[facility] ENDOSCOPY;  Service: Endoscopy;  Laterality: N/A;  . ESOPHAGOGASTRODUODENOSCOPY N/A 04/21/2016   Procedure: ESOPHAGOGASTRODUODENOSCOPY (EGD);  Surgeon: Gatha Mayer, MD;  Location: [facility] ENDOSCOPY;  Service: Endoscopy;  Laterality: N/A;  . ESOPHAGOGASTRODUODENOSCOPY N/A 05/06/2016   Procedure: ESOPHAGOGASTRODUODENOSCOPY (EGD);  Surgeon: Jonathon Bellows, MD;  Location: [facility] ENDOSCOPY;  Service: Gastroenterology;  Laterality: N/A;  . ESOPHAGOGASTRODUODENOSCOPY N/A 06/13/2016   Procedure: ESOPHAGOGASTRODUODENOSCOPY (EGD);  Surgeon: [name], MD;  Location: [facility] ENDOSCOPY;  Service: Endoscopy;  Laterality: N/A;  . ESOPHAGOGASTRODUODENOSCOPY (EGD) WITH PROPOFOL N/A 02/29/2016   Procedure: ESOPHAGOGASTRODUODENOSCOPY (EGD) WITH PROPOFOL;  Surgeon: [name], MD;  Location: [facility] ENDOSCOPY;  Service: Endoscopy;  Laterality: N/A;  . ESOPHAGOGASTRODUODENOSCOPY (EGD) WITH PROPOFOL N/A 01/15/2018   Procedure: ESOPHAGOGASTRODUODENOSCOPY (EGD) WITH PROPOFOL;  Surgeon: Daneil Dolin, MD;  Location: [facility] ENDO SUITE;  Service: Endoscopy;  Laterality:  N/A;  retrieval  forein body  . LAPAROTOMY N/A 09/12/2015   Procedure: EXPLORATORY LAPAROTOMY;  Surgeon: Florene Glen, MD;  Location: [facility] ORS;  Service: General;  Laterality: N/A;  . SIGMOIDOSCOPY N/A 09/12/2015   Procedure: Lonell Face;  Surgeon: Florene Glen, MD;  Location: [facility] ORS;  Service: General;  Laterality: N/A;  . WISDOM TOOTH EXTRACTION     Family History:  Family History  Problem Relation Age of Onset  . Depression Mother   . Hypertension Mother   . Sleep apnea Mother   . Asthma Mother   . COPD Mother   . Diabetes Mother   . Breast cancer Maternal Aunt        20's   Family Psychiatric  History: depression Social History:  Social History   Substance and Sexual Activity  Alcohol Use No     Social History   Substance and Sexual Activity  Drug Use No    Social History   Socioeconomic History  . Marital status: Single    Spouse name: Not on file  . Number of children: Not on file  . Years of education: Not on file  . Highest education level: Not on file  Occupational History  . Not on file  Social Needs  . Financial resource strain: Not on file  . Food insecurity:    Worry: Not on file    Inability: Not on file  . Transportation needs:    Medical: Not on file    Non-medical: Not on file  Tobacco Use  . Smoking status: Former Smoker    Packs/day: 0.50    Years: 3.00    Pack years: 1.50    Types: Cigarettes    Last attempt to quit: 08/2016    Years since quitting: 1.4  . Smokeless tobacco: Never Used  Substance and Sexual Activity  . Alcohol use: No  . Drug use: No  . Sexual activity: Never    Birth control/protection: Injection  Lifestyle  . Physical activity:    Days per week: Not on file    Minutes per session: Not on file  . Stress: Not on file  Relationships  . Social connections:    Talks on phone: Not on file    Gets together: Not on file    Attends religious service: Not on file    Active member of club or organization: Not on file     Attends meetings of clubs or organizations: Not on file    Relationship status: Not on file  Other Topics Concern  . Not on file  Social History Narrative  . Not on file   Additional Social History:                         Sleep: Fair  Appetite:  Fair  Current Medications: Current Facility-Administered Medications  Medication Dose Route Frequency Provider Last Rate Last Dose  . acetaminophen (TYLENOL) tablet 650 mg  650 mg Oral Q6H PRN [name], [name], MD      . alum & mag hydroxide-simeth (MAALOX/MYLANTA) 200-200-20 MG/5ML suspension 30 mL  30 mL Oral Q4H PRN [name], [name], MD      . amLODipine (NORVASC) tablet 2.5 mg  2.5 mg Oral Daily [name], [name], MD   2.5 mg at 02/07/18 0757  . cloZAPine (CLOZARIL) tablet 250 mg  250 mg Oral QHS [name], [name], MD   250 mg at 02/06/18 2126  . docusate sodium (COLACE)  capsule 100 mg  100 mg Oral BID [name], [name], MD   100 mg at 02/07/18 0758  . levothyroxine (SYNTHROID, LEVOTHROID) tablet 100 mcg  100 mcg Oral QAC breakfast [name], [name], MD   100 mcg at 02/07/18 0758  . lithium carbonate capsule 300 mg  300 mg Oral BID WC [name], [name], MD   300 mg at 02/07/18 0758  . loratadine (CLARITIN) tablet 10 mg  10 mg Oral Daily [name], [name], MD   10 mg at 02/07/18 0758  . magnesium hydroxide (MILK OF MAGNESIA) suspension 30 mL  30 mL Oral Daily PRN [name], [name], MD      . Oxcarbazepine (TRILEPTAL) tablet 600 mg  600 mg Oral BID [name], [name], MD   600 mg at 02/07/18 0758  . pantoprazole (PROTONIX) EC tablet 40 mg  40 mg Oral Daily [name], [name], MD   40 mg at 02/07/18 0757  . prazosin (MINIPRESS) capsule 2 mg  2 mg Oral QHS [name], [name], MD   2 mg at 02/06/18 2127  . traZODone (DESYREL) tablet 100 mg  100 mg Oral QHS [name], [name], MD   100 mg at 02/06/18 2127  . venlafaxine XR (EFFEXOR-XR) 24 hr capsule 150 mg  150 mg Oral Q breakfast [name], [name], MD   150 mg at 02/07/18 0757    Lab Results: No  results found for this or any previous visit (from the past 48 hour(s)).  Blood Alcohol level:  Lab Results  Component Value Date   ETH <10 01/31/2018   ETH <10 03/12/2535    Metabolic Disorder Labs: Lab Results  Component Value Date   HGBA1C 5.1 11/23/2017   MPG 99.67 11/23/2017   MPG 103 03/03/2016   Lab Results  Component Value Date   PROLACTIN 64.6 (H) 03/04/2016   PROLACTIN 15.8 06/16/2015   Lab Results  Component Value Date   CHOL 173 11/23/2017   TRIG 77 11/23/2017   HDL 41 11/23/2017   CHOLHDL 4.2 11/23/2017   VLDL 15 11/23/2017   LDLCALC 117 (H) 11/23/2017   LDLCALC 127 (H) 03/03/2016    Physical Findings: AIMS:  , ,  ,  ,    CIWA:    COWS:     Musculoskeletal: Strength & Muscle Tone: within normal limits Gait & Station: normal Patient leans: N/A  Psychiatric Specialty Exam: Physical Exam  Nursing note and vitals reviewed. Psychiatric: Her speech is normal. Her affect is blunt. She is actively hallucinating. Cognition and memory are impaired. She expresses impulsivity. She expresses suicidal ideation.    Review of Systems  Neurological: Negative.   Psychiatric/Behavioral: Positive for depression, hallucinations and suicidal ideas.  All other systems reviewed and are negative.   Blood pressure 128/74, pulse 93, temperature 99.1 F (37.3 C), temperature source Oral, resp. rate 18, height 5\' 6"  (1.676 m), weight 98 kg, last menstrual period 01/31/2018, SpO2 100 %.Body mass index is 34.86 kg/m.  General Appearance: Casual  Eye Contact:  Good  Speech:  Clear and Coherent  Volume:  Normal  Mood:  Depressed  Affect:  Blunt  Thought Process:  Goal Directed and Descriptions of Associations: Intact  Orientation:  Full (Time, Place, and Person)  Thought Content:  Hallucinations: Auditory Command:  to kill herself  Suicidal Thoughts:  Yes.  without intent/plan  Homicidal Thoughts:  No  Memory:  Immediate;   Fair Recent;   Fair Remote;   Fair   Judgement:  Poor  Insight:  Lacking  Psychomotor Activity:  Psychomotor Retardation  Concentration:  Concentration: Fair and Attention Span: Fair  Recall:  [employer] of Knowledge:  Fair  Language:  Fair  Akathisia:  No  Handed:  Right  AIMS (if indicated):     Assets:  Communication Skills Desire for Improvement Financial Resources/Insurance Housing Physical Health Resilience Social Support  ADL's:  Intact  Cognition:  WNL  Sleep:  Number of Hours: 7     Treatment Plan Summary: Daily contact with patient to assess and evaluate symptoms and progress in treatment and Medication management   [name] is a 27 year old female with a history of depression, anxiety, mood instability, psychosis and self injurious behavior admitted for auditory hallucinations and suicidal ideation with a plan to cut.   #Suicidal ideation, still suicidal -patient able to contract for safety in the hospital  #Mood and psychosis, still hallucinations -continueClozapine 250 mg nightly, level pending -continue Lithium 300 mg BID -continueTrileptal 600 mg BID -continue Effexor 150 mg daily  #PTSD -Minipress 2 mg nightly  #HTN -lisinopril 2.5 mg daily  #Hypothyroidism  -Synthroid 100 ug daily   #GERD -Protonix 40 mg daily  #Constipation -bowel regimen  #Social -incompetent adult -mother is the guardian  #Labs were obtained recently -pregnancy test is negative  #Disposition -discharge to her group home -follow up with [facility] ACT team  Orson Slick, MD 02/07/2018, 3:04 PM

## 2018-02-07 NOTE — Plan of Care (Signed)
  Problem: Education: Goal: Utilization of techniques to improve thought processes will improve Outcome: Progressing  Patient has improve thought process and she appears less anxious interacting with peers and staff appropriately.

## 2018-02-07 NOTE — Progress Notes (Signed)
Patient alert and oriented x 4, her thoughts are organized and coherent. Patient denies SI/HI/AH and contracts  for safety if she ever felt she ws experiencing AH. Patient's affect is flat brightens upon approach, she is interacting appropriately with peers and staff no distress noted..Patient is  15 minutes safety checks maintained, will continue to monitor closely.

## 2018-02-07 NOTE — Progress Notes (Signed)
Recreation Therapy Notes  Date: 02/07/2018  Time: 9:30 am   Location: Craft room   Behavioral response: N/A   Intervention Topic: Leisure  Discussion/Intervention: Patient did not attend group.   Clinical Observations/Feedback:  Patient did not attend group.   Trenika Hudson LRT/CTRS        Sharla Tankard 02/07/2018 11:20 AM

## 2018-02-07 NOTE — Progress Notes (Signed)
D: Patient stated slept good last night .Stated appetite good and energy level  Is normal. Stated concentration is good . Stated on Depression scale 3, hopeless  2 and anxiety 5 .( low 0-10 high) Denies suicidal  homicidal ideations  .  No auditory hallucinations  No pain concerns . Appropriate ADL'S. Interacting with peers and staff.  Patient voice of her living situation and how she can remain safe Patient a this time showing  No ownership for her behavior  Working on Radiographer, therapeutic.Verbalize understanding of information received  concerning medications Patient  aware of information received from  staff referring to  education about unit programing .  Able to verbalize understanding.Attending unit programing , verbalizing feelings somatic  complaints Voice no concerns around  sleep.  Voice no safety concerns .    Emotional and mental health status improving .   A: Encourage patient participation with unit programming . Instruction  Given on  Medication , verbalize understanding. R: Voice no other concerns. Staff continue to monitor

## 2018-02-07 NOTE — Plan of Care (Signed)
Working on Radiographer, therapeutic.Verbalize understanding of information received  concerning medications Patient  aware of information received from  staff referring to  education about unit programing .  Able to verbalize understanding.Attending unit programing , verbalizing feelings somatic  complaints Voice no concerns around  sleep.  Voice no safety concerns .    Emotional and mental health status improving .    Problem: Education: Goal: Knowledge of [facility] Education information/materials will improve Outcome: Progressing Goal: Emotional status will improve Outcome: Progressing Goal: Mental status will improve Outcome: Progressing Goal: Verbalization of understanding the information provided will improve Outcome: Progressing   Problem: Health Behavior/Discharge Planning: Goal: Identification of resources available to assist in meeting health care needs will improve Outcome: Progressing Goal: Compliance with treatment plan for underlying cause of condition will improve Outcome: Progressing   Problem: Education: Goal: Utilization of techniques to improve thought processes will improve Outcome: Progressing Goal: Knowledge of the prescribed therapeutic regimen will improve Outcome: Progressing   Problem: Activity: Goal: Interest or engagement in leisure activities will improve Outcome: Progressing Goal: Imbalance in normal sleep/wake cycle will improve Outcome: Progressing   Problem: Coping: Goal: Coping ability will improve Outcome: Progressing Goal: Will verbalize feelings Outcome: Progressing   Problem: Self-Concept: Goal: Will verbalize positive feelings about self Outcome: Progressing Goal: Level of anxiety will decrease Outcome: Progressing   Problem: Safety: Goal: Ability to remain free from injury will improve Outcome: Progressing

## 2018-02-07 NOTE — Progress Notes (Addendum)
[facility] MD Progress Note  02/08/2018 2:54 PM Melanie Bell  MRN:  [id number]  Subjective:    No change. Still suicidal and hallucinating, afraid to be discharge "too soon".   Principal Problem: Schizoaffective disorder, depressive type ([place]) Diagnosis:   Patient Active Problem List   Diagnosis Date Noted  . Schizoaffective disorder, depressive type ([place]) [F25.1] 02/02/2018    Priority: High  . Schizoaffective disorder, bipolar type ([place]) [F25.0] 11/06/2014    Priority: High  . Suicide attempt ([place]) [T14.91XA] 01/16/2018  . Dysfunctional uterine bleeding [N93.8] 01/06/2018  . Borderline personality disorder ([place]) [F60.3] 01/04/2018  . PTSD (post-traumatic stress disorder) [F43.10] 01/03/2018  . Suicidal ideation [R45.851] 01/02/2018  . Self-inflicted injury [E08.14] 11/23/2017  . ASCUS of cervix with negative high risk HPV [R87.610] 08/28/2017  . Malingering [Z76.5] 05/06/2016  . Foreign body aspiration [T17.900A]   . Other foreign object in esophagus causing other injury, subsequent encounter [T18.198D]   . Swallowed foreign body [T18.9XXA] 04/23/2016  . Foreign body ingestion [T18.9XXA] 04/21/2016  . Gastric foreign body [T18.2XXA]   . 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 Hypothyroidism [E03.9] 09/29/2014  . Hypertension [I10] 09/29/2014   Total Time spent with patient: 20 minutes  Past Psychiatric History: schizoaffective disorder  Past Medical History:  Past Medical History:  Diagnosis Date  . Anxiety   . Asthma   . Depression   . GERD (gastroesophageal reflux disease)   . Hallucinations 09/30/2014   Sizoaffective  . Hyperlipidemia   . Hypertension   . Intentional ingestion of batteries 04/21/2016    2 AAA batteries and 1 thumb tack; intent to hurt herself/notes 04/21/2016  . Tardive dyskinesia 10/2014   recent onset    Past Surgical History:  Procedure Laterality Date  . ABDOMINAL SURGERY     "years ago" to remove foreign objects  . APPENDECTOMY    .  BREAST EXCISIONAL BIOPSY Right 09/03/2007   surgical bx removed fibroadeoma  . BREAST LUMPECTOMY Right   . COLONOSCOPY WITH PROPOFOL N/A 09/10/2015   Procedure: COLONOSCOPY WITH PROPOFOL;  Surgeon: [name], MD;  Location[facility] ENDOSCOPY;  Service: Endoscopy;  Laterality: N/A;  . ESOPHAGOGASTRODUODENOSCOPY N/A 11/28/2014   Procedure: ESOPHAGOGASTRODUODENOSCOPY (EGD);  Surgeon: [name], MD;  Location: [facility] ENDOSCOPY;  Service: Endoscopy;  Laterality: N/A;  . ESOPHAGOGASTRODUODENOSCOPY N/A 02/21/2016   Procedure: ESOPHAGOGASTRODUODENOSCOPY (EGD);  Surgeon: Mauri Pole, MD;  Location: [facility] ENDOSCOPY;  Service: Endoscopy;  Laterality: N/A;  . ESOPHAGOGASTRODUODENOSCOPY N/A 04/21/2016   Procedure: ESOPHAGOGASTRODUODENOSCOPY (EGD);  Surgeon: Gatha Mayer, MD;  Location: [facility] ENDOSCOPY;  Service: Endoscopy;  Laterality: N/A;  . ESOPHAGOGASTRODUODENOSCOPY N/A 05/06/2016   Procedure: ESOPHAGOGASTRODUODENOSCOPY (EGD);  Surgeon: Jonathon Bellows, MD;  Location: [facility] ENDOSCOPY;  Service: Gastroenterology;  Laterality: N/A;  . ESOPHAGOGASTRODUODENOSCOPY N/A 06/13/2016   Procedure: ESOPHAGOGASTRODUODENOSCOPY (EGD);  Surgeon: [name], MD;  Location: [facility] ENDOSCOPY;  Service: Endoscopy;  Laterality: N/A;  . ESOPHAGOGASTRODUODENOSCOPY (EGD) WITH PROPOFOL N/A 02/29/2016   Procedure: ESOPHAGOGASTRODUODENOSCOPY (EGD) WITH PROPOFOL;  Surgeon: [name], MD;  Location: [facility] ENDOSCOPY;  Service: Endoscopy;  Laterality: N/A;  . ESOPHAGOGASTRODUODENOSCOPY (EGD) WITH PROPOFOL N/A 01/15/2018   Procedure: ESOPHAGOGASTRODUODENOSCOPY (EGD) WITH PROPOFOL;  Surgeon: Daneil Dolin, MD;  Location: [facility] ENDO SUITE;  Service: Endoscopy;  Laterality: N/A;  retrieval forein body  . LAPAROTOMY N/A 09/12/2015   Procedure: EXPLORATORY LAPAROTOMY;  Surgeon: Florene Glen, MD;  Location: [facility] ORS;  Service: General;  Laterality: N/A;  . SIGMOIDOSCOPY N/A 09/12/2015   Procedure: Lonell Face;  Surgeon: Jerrol Banana  Burt Knack,  MD;  Location: [facility] ORS;  Service: General;  Laterality: N/A;  . WISDOM TOOTH EXTRACTION     Family History:  Family History  Problem Relation Age of Onset  . Depression Mother   . Hypertension Mother   . Sleep apnea Mother   . Asthma Mother   . COPD Mother   . Diabetes Mother   . Breast cancer Maternal Aunt        20's   Family Psychiatric  History: none Social History:  Social History   Substance and Sexual Activity  Alcohol Use No     Social History   Substance and Sexual Activity  Drug Use No    Social History   Socioeconomic History  . Marital status: Single    Spouse name: Not on file  . Number of children: Not on file  . Years of education: Not on file  . Highest education level: Not on file  Occupational History  . Not on file  Social Needs  . Financial resource strain: Not on file  . Food insecurity:    Worry: Not on file    Inability: Not on file  . Transportation needs:    Medical: Not on file    Non-medical: Not on file  Tobacco Use  . Smoking status: Former Smoker    Packs/day: 0.50    Years: 3.00    Pack years: 1.50    Types: Cigarettes    Last attempt to quit: 08/2016    Years since quitting: 1.4  . Smokeless tobacco: Never Used  Substance and Sexual Activity  . Alcohol use: No  . Drug use: No  . Sexual activity: Never    Birth control/protection: Injection  Lifestyle  . Physical activity:    Days per week: Not on file    Minutes per session: Not on file  . Stress: Not on file  Relationships  . Social connections:    Talks on phone: Not on file    Gets together: Not on file    Attends religious service: Not on file    Active member of club or organization: Not on file    Attends meetings of clubs or organizations: Not on file    Relationship status: Not on file  Other Topics Concern  . Not on file  Social History Narrative  . Not on file   Additional Social History:                         Sleep:  Fair  Appetite:  Fair  Current Medications: Current Facility-Administered Medications  Medication Dose Route Frequency Provider Last Rate Last Dose  . acetaminophen (TYLENOL) tablet 650 mg  650 mg Oral Q6H PRN [name], [name], MD      . alum & mag hydroxide-simeth (MAALOX/MYLANTA) 200-200-20 MG/5ML suspension 30 mL  30 mL Oral Q4H PRN [name], [name], MD      . amLODipine (NORVASC) tablet 2.5 mg  2.5 mg Oral Daily [name], [name], MD   2.5 mg at 02/08/18 0813  . cloZAPine (CLOZARIL) tablet 250 mg  250 mg Oral QHS Lyndsi Altic B, MD   250 mg at 02/07/18 2127  . docusate sodium (COLACE) capsule 100 mg  100 mg Oral BID [name], [name], MD   100 mg at 02/08/18 0813  . levothyroxine (SYNTHROID, LEVOTHROID) tablet 100 mcg  100 mcg Oral QAC breakfast [name], [name], MD   100 mcg at 02/08/18 0813  .  lithium carbonate capsule 300 mg  300 mg Oral BID WC [name], [name], MD   300 mg at 02/08/18 6283  . loratadine (CLARITIN) tablet 10 mg  10 mg Oral Daily [name], [name], MD   10 mg at 02/08/18 0814  . magnesium hydroxide (MILK OF MAGNESIA) suspension 30 mL  30 mL Oral Daily PRN [name], [name], MD      . Oxcarbazepine (TRILEPTAL) tablet 600 mg  600 mg Oral BID Mushka Laconte B, MD   600 mg at 02/08/18 0813  . pantoprazole (PROTONIX) EC tablet 40 mg  40 mg Oral Daily [name], [name], MD   40 mg at 02/08/18 0814  . prazosin (MINIPRESS) capsule 2 mg  2 mg Oral QHS [name], [name], MD   2 mg at 02/07/18 2128  . traZODone (DESYREL) tablet 100 mg  100 mg Oral QHS [name], [name], MD   100 mg at 02/07/18 2128  . venlafaxine XR (EFFEXOR-XR) 24 hr capsule 150 mg  150 mg Oral Q breakfast [name], [name], MD   150 mg at 02/08/18 1517    Lab Results: No results found for this or any previous visit (from the past 48 hour(s)).  Blood Alcohol level:  Lab Results  Component Value Date   ETH <10 01/31/2018   ETH <10 61/60/7371    Metabolic Disorder Labs: Lab Results  Component Value Date   HGBA1C  5.1 11/23/2017   MPG 99.67 11/23/2017   MPG 103 03/03/2016   Lab Results  Component Value Date   PROLACTIN 64.6 (H) 03/04/2016   PROLACTIN 15.8 06/16/2015   Lab Results  Component Value Date   CHOL 173 11/23/2017   TRIG 77 11/23/2017   HDL 41 11/23/2017   CHOLHDL 4.2 11/23/2017   VLDL 15 11/23/2017   LDLCALC 117 (H) 11/23/2017   LDLCALC 127 (H) 03/03/2016    Physical Findings: AIMS: Facial and Oral Movements Muscles of Facial Expression: None, normal Lips and Perioral Area: None, normal Jaw: None, normal Tongue: None, normal,Extremity Movements Upper (arms, wrists, hands, fingers): None, normal Lower (legs, knees, ankles, toes): None, normal, Trunk Movements Neck, shoulders, hips: None, normal, Overall Severity Severity of abnormal movements (highest score from questions above): None, normal Incapacitation due to abnormal movements: None, normal Patient's awareness of abnormal movements (rate only patient's report): No Awareness, Dental Status Current problems with teeth and/or dentures?: No Does patient usually wear dentures?: No  CIWA:    COWS:     Musculoskeletal: Strength & Muscle Tone: within normal limits Gait & Station: normal Patient leans: N/A  Psychiatric Specialty Exam: Physical Exam  Nursing note and vitals reviewed. Psychiatric: She has a normal mood and affect. Her speech is normal. She is actively hallucinating. Cognition and memory are normal. She expresses impulsivity. She expresses suicidal ideation.    Review of Systems  Neurological: Negative.   Psychiatric/Behavioral: Positive for hallucinations and suicidal ideas.  All other systems reviewed and are negative.   Blood pressure 107/73, pulse 92, temperature 99 F (37.2 C), temperature source Oral, resp. rate 16, height 5\' 6"  (1.676 m), weight 98 kg, last menstrual period 01/31/2018, SpO2 98 %.Body mass index is 34.86 kg/m.  General Appearance: Casual  Eye Contact:  Good  Speech:  Clear and  Coherent  Volume:  Normal  Mood:  Depressed  Affect:  Flat  Thought Process:  Goal Directed and Descriptions of Associations: Intact  Orientation:  Full (Time, Place, and Person)  Thought Content:  Hallucinations: Auditory  Suicidal Thoughts:  No  Homicidal Thoughts:  No  Memory:  Immediate;   Fair Recent;   Fair Remote;   Fair  Judgement:  Poor  Insight:  Lacking  Psychomotor Activity:  Decreased  Concentration:  Concentration: Fair and Attention Span: Fair  Recall:  [employer] of Knowledge:  Fair  Language:  Fair  Akathisia:  No  Handed:  Right  AIMS (if indicated):     Assets:  Communication Skills Desire for Improvement Financial Resources/Insurance Housing Physical Health Resilience Social Support  ADL's:  Intact  Cognition:  WNL  Sleep:  Number of Hours: 6     Treatment Plan Summary: Daily contact with patient to assess and evaluate symptoms and progress in treatment and Medication management   Melanie Bell is a 27 year old female with a history of depression, anxiety, mood instability, psychosis and self injurious behavior admitted for auditory hallucinations and suicidal ideation with a plan to cut.   #Suicidal ideation, still suicidal -patient able to contract for safety in the hospital  #Mood and psychosis, still hallucinations -continueClozapine 250 mg nightly, level pending -continue Lithium 300 mg BID -continueTrileptal 600 mg BID -continue Effexor 150 mg daily  #PTSD -Minipress 2 mg nightly  #HTN -lisinopril 2.5 mg daily  #Hypothyroidism  -Synthroid 100 ug daily   #GERD -Protonix 40 mg daily  #Constipation -bowel regimen  #Social -incompetent adult -mother is the guardian  #Labs were obtained recently -pregnancy test is negative  #Disposition -discharge to her group home -follow up with [facility] ACT team   Orson Slick, MD 02/08/2018, 2:54 PM

## 2018-02-08 LAB — CLOZAPINE (CLOZARIL)
Clozapine Lvl: 120 ng/mL — ABNORMAL LOW (ref 350–650)
NorClozapine: 64 ng/mL
Total(Cloz+Norcloz): 184 ng/mL

## 2018-02-08 MED ORDER — ONDANSETRON 4 MG PO TBDP
4.0000 mg | ORAL_TABLET | Freq: Three times a day (TID) | ORAL | Status: DC | PRN
  Administered 2018-02-08 – 2018-02-09 (×2): 4 mg via ORAL
  Filled 2018-02-08 (×2): qty 1

## 2018-02-08 MED ORDER — OXCARBAZEPINE 600 MG PO TABS: 600.0000 mg | ORAL_TABLET | Freq: Two times a day (BID) | ORAL | 1 refills | Status: DC

## 2018-02-08 MED ORDER — CLOZAPINE 50 MG PO TABS: 250.0000 mg | ORAL_TABLET | Freq: Every day | ORAL | 1 refills | Status: DC

## 2018-02-08 MED ORDER — FLUVOXAMINE MALEATE 50 MG PO TABS
50.0000 mg | ORAL_TABLET | Freq: Every day | ORAL | Status: DC
  Administered 2018-02-08 – 2018-02-09 (×2): 50 mg via ORAL
  Filled 2018-02-08 (×3): qty 1

## 2018-02-08 MED ORDER — FLUVOXAMINE MALEATE 50 MG PO TABS: 50.0000 mg | ORAL_TABLET | Freq: Every day | ORAL | 1 refills | Status: DC
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
## 2018-02-08 NOTE — Progress Notes (Signed)
Patient alert and oriented x 4, her thoughts are organized and coherent. Patient denies SI/HI but endorses AH, she said voices are not command hallucination and  she contracts  for safety, more visual checks initiated.  Patient's affect is flat but brightens upon approach, she is interacting appropriately with peers and staff no distress noted..Support and encouragement given to patient and she was encouged to attend evening wrap up group.  Patient attends groups and interacted appropriately/  15 minutes safety checks maintained, will continue to monitor closely.

## 2018-02-08 NOTE — Plan of Care (Signed)
  Problem: Education: Goal: Knowledge of  General Education information/materials will improve Outcome: Progressing Goal: Emotional status will improve Outcome: Progressing Goal: Mental status will improve Outcome: Progressing   Problem: Safety: Goal: Ability to remain free from injury will improve Outcome: Progressing  Patient only complaint is tiredness. Patient endorses auditory hallucinations that are quieter than before. She is safe and free from injury on the unit

## 2018-02-08 NOTE — Progress Notes (Signed)
D: Patient is alert, oriented, pleasant, and cooperative. Patient complains of tiredness. Patient endorses auditory hallucinations and describes them as "quieter, can't tell what they're saying". Denies SI, HI, visual hallucinations, and verbally contracts for safety. Patient reports she slept good last night though she still feels tired this morning. Patient reports her appetite is good, her energy level is normal, and her concentration is good. Patient rates her depression 2/10, hopelessness 0/10, and anxiety 2/10. Patient denies any physical problems/pain. Patient states her goal for today is "find out about living situation (what needs to be done to make me feel safe)" and that "talking with social worker" will help her meet her goal.   A: Scheduled medications administered per MD order. Support provided. Routine safety checks every 15 minutes. Patient stated understanding to tell nurse about any new physical symptoms she experiences. Patient understands to tell staff if she needs anything.    R: No adverse drug reactions noted. Patient verbally contracts for safety. Patient remains safe at this time and will continue to monitor.

## 2018-02-08 NOTE — Progress Notes (Signed)
Recreation Therapy Notes  Date: 02/08/2018  Time: 9:30 am   Location: Craft room   Behavioral response: N/A   Intervention Topic: Communication  Discussion/Intervention: Patient did not attend group.   Clinical Observations/Feedback:  Patient did not attend group.   Diamond Martucci LRT/CTRS        Corday Wyka 02/08/2018 10:57 AM

## 2018-02-08 NOTE — [facility]HH Suicide Risk Assessment (Signed)
[facility] Discharge Suicide Risk Assessment   Principal Problem: Schizoaffective disorder, depressive type [facility]) Discharge Diagnoses:  Patient Active Problem List   Diagnosis Date Noted  . Schizoaffective disorder, depressive type ([place]) [F25.1] 02/02/2018    Priority: High  . Schizoaffective disorder, bipolar type ([place]) [F25.0] 11/06/2014    Priority: High  . Suicide attempt ([place]) [T14.91XA] 01/16/2018  . Dysfunctional uterine bleeding [N93.8] 01/06/2018  . Borderline personality disorder ([place]) [F60.3] 01/04/2018  . PTSD (post-traumatic stress disorder) [F43.10] 01/03/2018  . Suicidal ideation [R45.851] 01/02/2018  . Self-inflicted injury [G64.40] 11/23/2017  . ASCUS of cervix with negative high risk HPV [R87.610] 08/28/2017  . Malingering [Z76.5] 05/06/2016  . Foreign body aspiration [T17.900A]   . Other foreign object in esophagus causing other injury, subsequent encounter [T18.198D]   . Swallowed foreign body [T18.9XXA] 04/23/2016  . Foreign body ingestion [T18.9XXA] 04/21/2016  . Gastric foreign body [T18.2XXA]   . 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 Hypothyroidism [E03.9] 09/29/2014  . Hypertension [I10] 09/29/2014    Total Time spent with patient: 20 minutes  Musculoskeletal: Strength & Muscle Tone: within normal limits Gait & Station: normal Patient leans: N/A  Psychiatric Specialty Exam: Review of Systems  Neurological: Negative.   Psychiatric/Behavioral: Negative.   All other systems reviewed and are negative.   Blood pressure 117/71, pulse 78, temperature 98.6 F (37 C), temperature source Oral, resp. rate 18, height 5\' 6"  (1.676 m), weight 98 kg, last menstrual period 01/31/2018, SpO2 98 %.Body mass index is 34.86 kg/m.  General Appearance: Casual  Eye Contact::  Good  Speech:  Clear and Coherent409  Volume:  Normal  Mood:  Euthymic  Affect:  Flat  Thought Process:  Goal Directed and Descriptions of Associations: Intact  Orientation:  Full (Time, Place, and  Person)  Thought Content:  WDL  Suicidal Thoughts:  No  Homicidal Thoughts:  No  Memory:  Immediate;   Fair Recent;   Fair Remote;   Fair  Judgement:  Impaired  Insight:  Lacking  Psychomotor Activity:  Normal  Concentration:  Fair  Recall:  [place]  Language: Fair  Akathisia:  No  Handed:  Right  AIMS (if indicated):     Assets:  Communication Skills Desire for Improvement Financial Resources/Insurance Housing Physical Health Resilience Social Support  Sleep:  Number of Hours: 7.75  Cognition: WNL  ADL's:  Intact   Mental Status Per Nursing Assessment::   On Admission:  Suicidal ideation indicated by patient, Self-harm thoughts  Demographic Factors:  Caucasian  Loss Factors: NA  Historical Factors: Prior suicide attempts and Impulsivity  Risk Reduction Factors:   Sense of responsibility to family, Living with another person, especially a relative and Positive social support  Continued Clinical Symptoms:  Bipolar Disorder:   Depressive phase  Cognitive Features That Contribute To Risk:  None    Suicide Risk:  Minimal: No identifiable suicidal ideation.  Patients presenting with no risk factors but with morbid ruminations; may be classified as minimal risk based on the severity of the depressive symptoms    Plan Of Care/Follow-up recommendations:  Activity:  as tolerated Diet:  low sodium heart healthy Other:  keep follow up appointments  Orson Slick, MD 02/09/2018, 9:08 AM

## 2018-02-08 NOTE — BHH Group Notes (Signed)
LCSW Group Therapy Note  02/08/2018 1:00 pm  Type of Therapy/Topic:  Group Therapy:  Balance in Life  Participation Level:  Did Not Attend  Description of Group:    This group will address the concept of balance and how it feels and looks when one is unbalanced. Patients will be encouraged to process areas in their lives that are out of balance and identify reasons for remaining unbalanced. Facilitators will guide patients in utilizing problem-solving interventions to address and correct the stressor making their life unbalanced. Understanding and applying boundaries will be explored and addressed for obtaining and maintaining a balanced life. Patients will be encouraged to explore ways to assertively make their unbalanced needs known to significant others in their lives, using other group members and facilitator for support and feedback.  Therapeutic Goals: 1. Patient will identify two or more emotions or situations they have that consume much of in their lives. 2. Patient will identify signs/triggers that life has become out of balance:  3. Patient will identify two ways to set boundaries in order to achieve balance in their lives:  4. Patient will demonstrate ability to communicate their needs through discussion and/or role plays  Summary of Patient Progress:      Therapeutic Modalities:   Cognitive Behavioral Therapy Solution-Focused Therapy Assertiveness Training  [name], [place] 02/08/2018 3:08 PM

## 2018-02-09 LAB — CBC WITH DIFFERENTIAL/PLATELET
Basophils Absolute: 0 10*3/uL (ref 0–0.1)
Basophils Relative: 0 %
EOS PCT: 3 %
Eosinophils Absolute: 0.1 10*3/uL (ref 0–0.7)
HCT: 36 % (ref 35.0–47.0)
Hemoglobin: 12.3 g/dL (ref 12.0–16.0)
LYMPHS PCT: 22 %
Lymphs Abs: 1.3 10*3/uL (ref 1.0–3.6)
MCH: 31.4 pg (ref 26.0–34.0)
MCHC: 34.2 g/dL (ref 32.0–36.0)
MCV: 91.6 fL (ref 80.0–100.0)
MONO ABS: 0.4 10*3/uL (ref 0.2–0.9)
MONOS PCT: 7 %
Neutro Abs: 4 10*3/uL (ref 1.4–6.5)
Neutrophils Relative %: 68 %
PLATELETS: 205 10*3/uL (ref 150–440)
RBC: 3.93 MIL/uL (ref 3.80–5.20)
RDW: 14.1 % (ref 11.5–14.5)
WBC: 5.9 10*3/uL (ref 3.6–11.0)

## 2018-02-09 NOTE — Progress Notes (Addendum)
D:Patient denies SI/HI/AVH at this time. Pt appears calm and cooperative, and no distress noted. Pt. Given extensive discharge education.   A: All Personal items in locker/bedroom returned to pt. Pt escorted out of the building by this Probation officer.   R:  Pt States she will comply with outpatient services, and take MEDS as prescribed.

## 2018-02-09 NOTE — [facility] Group Notes (Signed)
02/09/2018 /1PM  Type of Therapy and Topic:  Group Therapy:  Feelings around Relapse and Recovery  Participation Level:  Active   Description of Group:    Patients in this group will discuss emotions they experience before and after a relapse. They will process how experiencing these feelings, or avoidance of experiencing them, relates to having a relapse. Facilitator will guide patients to explore emotions they have related to recovery. Patients will be encouraged to process which emotions are more powerful. They will be guided to discuss the emotional reaction significant others in their lives may have to patients' relapse or recovery. Patients will be assisted in exploring ways to respond to the emotions of others without this contributing to a relapse.  Therapeutic Goals: 1. Patient will identify two or more emotions that lead to a relapse for them 2. Patient will identify two emotions that result when they relapse 3. Patient will identify two emotions related to recovery 4. Patient will demonstrate ability to communicate their needs through discussion and/or role plays   Summary of Patient Progress: Actively and appropriately engaged in the group. Patient was able to provide support and validation to other group members.Patient practiced active listening when interacting with the facilitator and other group members. Patient is still in the process of obtaining treatment goals.      Therapeutic Modalities:   Cognitive Behavioral Therapy Solution-Focused Therapy Assertiveness Training Relapse Prevention Therapy   Darin Engels, [place] 02/09/2018 2:24 PM

## 2018-02-09 NOTE — Progress Notes (Signed)
Clozapine monitoring Consult   27 yo female ordered clozapine 200 mg PO daily  09/27 ANC 4000  Information entered into Clozapine registry and pt eligible to receive clozapine Next labs due in a week - ordered for 10/04  Pharmacy will continue to follow.    Olivia Canter, [facility] 02/09/2018 11:10 AM

## 2018-02-09 NOTE — Progress Notes (Signed)
Recreation Therapy Notes  Date: 02/09/2018  Time: 9:30 am   Location: Craft room   Behavioral response: N/A   Intervention Topic: Stress  Discussion/Intervention: Patient did not attend group.   Clinical Observations/Feedback:  Patient did not attend group.   Averly Ericson LRT/CTRS         Demiana Crumbley 02/09/2018 10:26 AM

## 2018-02-09 NOTE — Progress Notes (Signed)
  [facility] Adult Case Management Discharge Plan :  Will you be returning to the same living situation after discharge:  Yes,  Pt will be return to group home. At discharge, do you have transportation home?: Yes,  A staff member of the grouphome will be picking pt up at discharge. Do you have the ability to pay for your medications: Yes,  Pt has financial means to pay for her medications.  Release of information consent forms completed and in the chart;  Patient's signature needed at discharge.  Patient to Follow up at:   Next level of care provider has access to [place] and Suicide Prevention discussed: Yes,  No safety concerns noted  Have you used any form of tobacco in the last 30 days? (Cigarettes, Smokeless Tobacco, Cigars, and/or Pipes): No  Has patient been referred to the Quitline?: N/A patient is not a smoker  Patient has been referred for addiction treatment: [place], LCSW 02/09/2018, 9:24 AM

## 2018-02-09 NOTE — [facility] Suicide Risk Assessment (Signed)
[place] INPATIENT:  Family/Significant Other Suicide Prevention Education  Suicide Prevention Education:  Education Completed; [name] (mother/guardian at [phone] has been identified by the patient as the family member who will aid the patient in the event of a mental health crisis (suicidal ideations/suicide attempt).  With written consent from the patient, the family member has been provided the following suicide prevention education, prior to the and/or following the discharge of the patient.  The suicide prevention education provided includes the following:  Suicide risk factors  Suicide prevention and interventions  National Suicide Hotline telephone number  [facility] assessment telephone number  [facility] Emergency Assistance [place] and/or Residential Mobile Crisis Unit telephone number  Request made of family/significant other to:  Remove weapons (e.g., guns, rifles, knives), all items previously/currently identified as safety concern.    Remove drugs/medications (over-the-counter, prescriptions, illicit drugs), all items previously/currently identified as a safety concern.  The family member verbalizes understanding of the suicide prevention education information provided.  CSW also spoke with staff at [facility] where pt current resides.  [name], LCSW 02/09/2018, 9:20 AM

## 2018-02-09 NOTE — Progress Notes (Signed)
Patient denies SI/HI, denies A/V hallucinations. Patient verbalizes understanding of discharge instructions, follow up care and prescriptions. Patient waiting for ride.

## 2018-02-09 NOTE — Progress Notes (Signed)
Recreation Therapy Notes  INPATIENT RECREATION TR PLAN  Patient Details [name] MRN: [id number] DOB: 07/12/1990 Today's Date: 02/09/2018  Rec Therapy Plan Is patient appropriate for Therapeutic Recreation?: Yes Treatment times per week: at least 3 Estimated Length of Stay: 5-7 days TR Treatment/Interventions: Group participation (Comment)  Discharge Criteria Pt will be discharged from therapy if:: Discharged Treatment plan/goals/alternatives discussed and agreed upon by:: Patient/family  Discharge Summary Short term goals set: Patient will engage in groups without prompting or encouragement from LRT x3 group sessions within 5 recreation therapy group sessions Short term goals met: Not met Progress toward goals comments: Groups attended Which groups?: Other (Comment)(Emotions, Problem Solving) Reason goals not met: Patient spent most of her time in her room Therapeutic equipment acquired: N/A Reason patient discharged from therapy: Discharge from hospital Pt/family agrees with progress & goals achieved: Yes Date patient discharged from therapy: 02/09/18   Melanie Bell 02/09/2018, 1:00 PM

## 2018-02-09 NOTE — Plan of Care (Signed)
  Problem: Coping: Goal: Coping ability will improve Outcome: Progressing  Patient is coping effectively

## 2018-02-09 NOTE — Tx Team (Signed)
Interdisciplinary Treatment and Diagnostic Plan Update  02/09/2018 Time of Session: 10:30am Melanie Bell MRN: [id number]  Principal Diagnosis: Schizoaffective disorder, depressive type [name])  Secondary Diagnoses: Principal Problem:   Schizoaffective disorder, depressive type (Melanie Bell) Active Problems:   Hypothyroidism   Suicidal ideation   PTSD (post-traumatic stress disorder)   Current Medications:  Current Facility-Administered Medications  Medication Dose Route Frequency Provider Last Rate Last Dose  . acetaminophen (TYLENOL) tablet 650 mg  650 mg Oral Q6H PRN [name], [name], MD      . alum & mag hydroxide-simeth (MAALOX/MYLANTA) 200-200-20 MG/5ML suspension 30 mL  30 mL Oral Q4H PRN [name], [name], MD   30 mL at 02/08/18 1651  . amLODipine (NORVASC) tablet 2.5 mg  2.5 mg Oral Daily [name], [name], MD   2.5 mg at 02/09/18 0836  . cloZAPine (CLOZARIL) tablet 250 mg  250 mg Oral QHS [name], [name], MD   250 mg at 02/08/18 2229  . docusate sodium (COLACE) capsule 100 mg  100 mg Oral BID [name], [name], MD   100 mg at 02/09/18 0837  . fluvoxaMINE (LUVOX) tablet 50 mg  50 mg Oral QHS [name], [name], MD   50 mg at 02/08/18 2233  . levothyroxine (SYNTHROID, LEVOTHROID) tablet 100 mcg  100 mcg Oral QAC breakfast [name], [name], MD   100 mcg at 02/09/18 0837  . lithium carbonate capsule 300 mg  300 mg Oral BID WC [name], [name], MD   300 mg at 02/09/18 0836  . loratadine (CLARITIN) tablet 10 mg  10 mg Oral Daily [name], [name], MD   10 mg at 02/09/18 0837  . magnesium hydroxide (MILK OF MAGNESIA) suspension 30 mL  30 mL Oral Daily PRN [name], [name], MD      . ondansetron (ZOFRAN-ODT) disintegrating tablet 4 mg  4 mg Oral Q8H PRN [name], [name], MD   4 mg at 02/09/18 0837  . Oxcarbazepine (TRILEPTAL) tablet 600 mg  600 mg Oral BID [name], [name], MD   600 mg at 02/09/18 0836  . pantoprazole (PROTONIX) EC tablet 40 mg  40 mg Oral Daily [name], [name],  MD   40 mg at 02/09/18 0836  . prazosin (MINIPRESS) capsule 2 mg  2 mg Oral QHS [name], [name], MD   2 mg at 02/07/18 2128  . traZODone (DESYREL) tablet 100 mg  100 mg Oral QHS [name], [name], MD   100 mg at 02/08/18 2229  . venlafaxine XR (EFFEXOR-XR) 24 hr capsule 150 mg  150 mg Oral Q breakfast [name], [name], MD   150 mg at 02/09/18 2094   PTA Medications: Medications Prior to Admission  Medication Sig Dispense Refill Last Dose  . amLODipine (NORVASC) 2.5 MG tablet Take 2.5 mg by mouth daily.   Unknown at Unknown  . cetirizine (ZYRTEC) 10 MG tablet Take 1 tablet (10 mg total) by mouth daily as needed for allergies. 30 tablet 1 Unknown at PRN  . cloZAPine (CLOZARIL) 25 MG tablet Take 7 tablets (175 mg total) by mouth at bedtime. 210 tablet 0 Unknown at Unknown  . docusate sodium (COLACE) 100 MG capsule Take 1 capsule (100 mg total) by mouth daily as needed. (Patient taking differently: Take 100 mg by mouth daily as needed for mild constipation or moderate constipation. ) 30 capsule 0 Unknown at PRN  . hydrOXYzine (ATARAX/VISTARIL) 50 MG tablet Take 1 tablet (50 mg total) by mouth 2 (two) times daily as  needed. 90 tablet 1 Unknown at PRN  . levothyroxine (SYNTHROID, LEVOTHROID) 100 MCG tablet Take 1 tablet (100 mcg total) by mouth daily before breakfast. 30 tablet 1 Unknown at Unknown  . lithium carbonate 300 MG capsule Take 1 capsule (300 mg total) by mouth 2 (two) times daily with a meal. 60 capsule 1 Unknown at Unknown  . medroxyPROGESTERone (DEPO-PROVERA) 150 MG/ML injection Inject 1 mL (150 mg total) into the muscle every 3 (three) months. 1 mL 1 As directed at As directed  . omeprazole (PRILOSEC) 20 MG capsule Take 1 capsule (20 mg total) by mouth daily. 30 capsule 1 Unknown at Unknown  . OXcarbazepine (TRILEPTAL) 300 MG tablet Take 1 tablet (300 mg total) by mouth 2 (two) times daily. 60 tablet 1 Unknown at Unknown  . polyethylene glycol (MIRALAX / GLYCOLAX) packet Take 17 g by mouth  daily as needed. 30 each 1 Unknown at PRN  . prazosin (MINIPRESS) 2 MG capsule Take 1 capsule (2 mg total) by mouth at bedtime. 30 capsule 0 Unknown at Unknown  . senna (SENOKOT) 8.6 MG TABS tablet Take 1 tablet (8.6 mg total) by mouth daily as needed for mild constipation. 120 each 0 Unknown at PRN  . traZODone (DESYREL) 50 MG tablet Take 1 tablet (50 mg total) by mouth at bedtime. 30 tablet 1 Unknown at Unknown  . venlafaxine XR (EFFEXOR-XR) 150 MG 24 hr capsule Take 1 capsule (150 mg total) by mouth daily with breakfast. 30 capsule 1 Unknown at Unknown  . Vitamin D, Ergocalciferol, (DRISDOL) 50000 units CAPS capsule Take 1 capsule (50,000 Units total) by mouth every 7 (seven) days. 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   Physician Treatment Plan for Primary Diagnosis: Schizoaffective disorder, depressive type ([place]) Long Term Goal(s): Improvement in symptoms so as ready for discharge Improvement in symptoms so as ready for discharge   Short Term Goals: Ability to identify changes in lifestyle to reduce recurrence of condition will improve Ability to verbalize feelings will improve Ability to disclose and discuss suicidal ideas Ability to demonstrate self-control will improve Ability to identify and develop effective coping behaviors will improve Ability to maintain clinical measurements within normal limits will improve Ability to identify triggers associated with substance abuse/mental health issues will improve NA  Medication Management: Evaluate patient's response, side effects, and tolerance of medication regimen.  Therapeutic Interventions: 1 to 1 sessions, Unit Group sessions  and Medication administration.  Evaluation of Outcomes: Progressing  Physician Treatment Plan for Secondary Diagnosis: Principal Problem:   Schizoaffective disorder, depressive type (Melanie Bell) Active Problems:   Hypothyroidism   Suicidal ideation   PTSD (post-traumatic stress disorder)  Long Term Goal(s): Improvement in symptoms so as ready for discharge Improvement in symptoms so as ready for discharge   Short Term Goals: Ability to identify changes in lifestyle to reduce recurrence of condition will improve Ability to verbalize feelings will improve Ability to disclose and discuss suicidal ideas Ability to demonstrate self-control will improve Ability to identify and develop effective coping behaviors will improve Ability to maintain clinical measurements within normal limits will improve Ability to identify triggers associated with substance abuse/mental health issues will improve NA     Medication Management: Evaluate patient's response, side effects, and tolerance of medication regimen.  Therapeutic  Interventions: 1 to 1 sessions, Unit Group sessions and Medication administration.  Evaluation of Outcomes: Progressing   RN Treatment Plan for Primary Diagnosis: Schizoaffective disorder, depressive type (Melanie Bell) Long Term Goal(s): Knowledge of disease and therapeutic regimen to maintain health will improve  Short Term Goals: Ability to identify and develop effective coping behaviors will improve and Compliance with prescribed medications will improve  Medication Management: RN will administer medications as ordered by provider, will assess and evaluate patient's response and provide education to patient for prescribed medication. RN will report any adverse and/or side effects to prescribing provider.  Therapeutic Interventions: 1 on 1 counseling sessions, Psychoeducation, Medication administration, Evaluate responses to treatment, Monitor vital signs and CBGs as ordered,  Perform/monitor CIWA, COWS, AIMS and Fall Risk screenings as ordered, Perform wound care treatments as ordered.  Evaluation of Outcomes: Progressing   LCSW Treatment Plan for Primary Diagnosis: Schizoaffective disorder, depressive type ([place]) Long Term Goal(s): Safe transition to appropriate next level of care at discharge, Engage patient in therapeutic group addressing interpersonal concerns.  Short Term Goals: Engage patient in aftercare planning with referrals and resources, Identify triggers associated with mental health/substance abuse issues and Increase skills for wellness and recovery  Therapeutic Interventions: Assess for all discharge needs, 1 to 1 time with Social worker, Explore available resources and support systems, Assess for adequacy in community support network, Educate family and significant other(s) on suicide prevention, Complete Psychosocial Assessment, Interpersonal group therapy.  Evaluation of Outcomes: Progressing   Progress in Treatment: Attending groups: No. Participating in groups: No. Taking medication as prescribed: Yes. Toleration medication: Yes. Family/Significant other contact made: Yes, individual(s) contacted:  Santiago Glad Brooks/mother/guardian was contacted by CSW. Patient understands diagnosis: Yes. Discussing patient identified problems/goals with staff: Yes. Medical problems stabilized or resolved: Yes. Denies suicidal/homicidal ideation: Yes. Issues/concerns per patient self-inventory: No. Other:    New problem(s) identified: No, Describe:     New Short Term/Long Term Goal(s):  Patient Goals:  Improve depression  Discharge Plan or Barriers: Discharge to [facility] on 02/09/18 and follow up with [facility] ACTT team  Reason for Continuation of Hospitalization: Other; describe Pt's sxs have stabilized and she will be discharged today.  Estimated Length of Stay: 0 days  Recreational Therapy: Patient Stressors: N/A Patient Goal: Patient  will engage in groups without prompting or encouragement from LRT x3 group sessions within 5 recreation therapy group sessions  Attendees: Patient: 02/09/2018 12:28 PM  Physician: Herma Ard [name] 02/09/2018 12:28 PM  Nursing: Roanna Epley, RN 02/09/2018 12:28 PM  RN Care Manager: 02/09/2018 12:28 PM  Social Worker: Derrek Gu, LCSW 02/09/2018 12:28 PM  Recreational Therapist: Roanna Epley, LRT 02/09/2018 12:28 PM  Other: Darin Engels, [place] 02/09/2018 12:28 PM  Other:  02/09/2018 12:28 PM  Other: 02/09/2018 12:28 PM    Scribe for Treatment Team: [name], LCSW 02/09/2018 12:28 PM

## 2018-02-09 NOTE — Progress Notes (Signed)
Patient alert and oriented x 4,denies SI/HI/AVH, thoughts are organized and coherent, , expressed to writer that she feels tired and was noted laying in bed with eyes closed, she rated depression a 710. Patient's affect is flat but brightens upon approach, she is noted interacting appropriately with her roommate and staff no distress noted..Support and encouragement given to patient and she was encouged to attend evening wrap up group.  Patient did not attend evening wrap up group 15 minutes safety checks maintained, will continue to monitor closely.

## 2018-02-09 NOTE — NC FL2 (Signed)
[place] LEVEL OF CARE SCREENING TOOL     IDENTIFICATION  Patient Name: Daylyn Christine Birthdate: 15-Jan-1991 Sex: female Admission Date (Current Location): 02/02/2018  [place] and [place] Number:   [name]  [id number] [place] and Address:   [facility]  [place], [place]  [address]      Provider Number:  [id number]  Attending Physician Name and Address:  [name], MD  Relative Name and Phone Number:   Pamalee Leyden  [phone]    Current Level of Care:  Inpatient/Hospital Recommended Level of Care:  Level II/[facility] Prior Approval Number:    Date Approved/Denied:   PASRR Number:    Discharge Plan:  Level II/[place]    Current Diagnoses: Patient Active Problem List   Diagnosis Date Noted  . Schizoaffective disorder, depressive type ([place]) 02/02/2018  . Suicide attempt ([place]) 01/16/2018  . Dysfunctional uterine bleeding 01/06/2018  . Borderline personality disorder ([place]) 01/04/2018  . PTSD (post-traumatic stress disorder) 01/03/2018  . Suicidal ideation 01/02/2018  . Self-inflicted injury 84/66/5993  . ASCUS of cervix with negative high risk HPV 08/28/2017  . Malingering 05/06/2016  . Foreign body aspiration   . Other foreign object in esophagus causing other injury, subsequent encounter   . Swallowed foreign body 04/23/2016  . Foreign body ingestion 04/21/2016  . Gastric foreign body   . Breast tumor 03/05/2016  . Schizoaffective disorder, bipolar type ([place]) 11/06/2014  . Hypothyroidism 09/29/2014  . Hypertension 09/29/2014    Orientation RESPIRATION BLADDER Height & Weight    Self, Time, Situation, Place     Normal  Continent Weight: 216 lb (98 kg) Height:  5\' 6"  (167.6 cm)  BEHAVIORAL SYMPTOMS/MOOD NEUROLOGICAL BOWEL NUTRITION STATUS       Continent  Regular Diet  AMBULATORY STATUS COMMUNICATION OF NEEDS Skin    Independent  Verbally    Normal                      Personal Care Assistance Level of Assistance   Bathing, Feeding, Dressing  All Independent         Functional Limitations Info   Sight, Hearing, Speech  All Adequate        SPECIAL CARE FACTORS FREQUENCY                       Contractures  Not present    Additional Factors Info                  Current Medications (02/09/2018):  This is the current hospital active medication list Current Facility-Administered Medications  Medication Dose Route Frequency Provider Last Rate Last Dose  . acetaminophen (TYLENOL) tablet 650 mg  650 mg Oral Q6H PRN [name], [name], MD      . alum & mag hydroxide-simeth (MAALOX/MYLANTA) 200-200-20 MG/5ML suspension 30 mL  30 mL Oral Q4H PRN [name], [name], MD   30 mL at 02/08/18 1651  . amLODipine (NORVASC) tablet 2.5 mg  2.5 mg Oral Daily [name], [name], MD   2.5 mg at 02/09/18 0836  . cloZAPine (CLOZARIL) tablet 250 mg  250 mg Oral QHS [name], [name], MD   250 mg at 02/08/18 2229  . docusate sodium (COLACE) capsule 100 mg  100 mg Oral BID [name], [name], MD   100 mg at 02/09/18 0837  . fluvoxaMINE (LUVOX) tablet 50 mg  50 mg Oral QHS [name],  Wardell Honour, MD   50 mg at 02/08/18 2233  . levothyroxine (SYNTHROID, LEVOTHROID) tablet 100 mcg  100 mcg Oral QAC breakfast [name], [name], MD   100 mcg at 02/09/18 0837  . lithium carbonate capsule 300 mg  300 mg Oral BID WC [name], [name], MD   300 mg at 02/09/18 0836  . loratadine (CLARITIN) tablet 10 mg  10 mg Oral Daily [name], [name], MD   10 mg at 02/09/18 0837  . magnesium hydroxide (MILK OF MAGNESIA) suspension 30 mL  30 mL Oral Daily PRN [name], [name], MD      . ondansetron (ZOFRAN-ODT) disintegrating tablet 4 mg  4 mg Oral Q8H PRN [name], [name], MD   4 mg at 02/09/18 0837  . Oxcarbazepine (TRILEPTAL) tablet 600 mg  600 mg Oral BID [name], [name], MD   600 mg at 02/09/18 0836  . pantoprazole (PROTONIX) EC tablet 40 mg  40 mg Oral Daily [name], [name], MD   40 mg at 02/09/18 0836  . prazosin (MINIPRESS) capsule 2 mg  2 mg Oral QHS [name], [name], MD   2 mg at 02/07/18 2128  . traZODone (DESYREL) tablet 100 mg  100 mg Oral QHS [name], [name], MD   100 mg at 02/08/18 2229  . venlafaxine XR (EFFEXOR-XR) 24 hr capsule 150 mg  150 mg Oral Q breakfast [name], [name], MD   150 mg at 02/09/18 4917     Discharge Medications: Please see discharge summary for a list of discharge medications.  Relevant Imaging Results:  Relevant Lab Results:   Additional Information  SS# [id number]  [name], LCSW

## 2019-07-01 ENCOUNTER — Ambulatory Visit (INDEPENDENT_AMBULATORY_CARE_PROVIDER_SITE_OTHER): Payer: Medicare Other | Admitting: Certified Nurse Midwife

## 2019-07-01 ENCOUNTER — Other Ambulatory Visit: Payer: Self-pay

## 2019-07-01 ENCOUNTER — Encounter: Payer: Self-pay | Admitting: Certified Nurse Midwife

## 2019-07-01 VITALS — BP 113/67 | HR 80 | Ht 66.0 in | Wt 247.2 lb

## 2019-07-01 DIAGNOSIS — N926 Irregular menstruation, unspecified: Secondary | ICD-10-CM

## 2019-07-01 DIAGNOSIS — Z3202 Encounter for pregnancy test, result negative: Secondary | ICD-10-CM

## 2019-07-01 DIAGNOSIS — Z136 Encounter for screening for cardiovascular disorders: Secondary | ICD-10-CM

## 2019-07-01 DIAGNOSIS — Z6839 Body mass index (BMI) 39.0-39.9, adult: Secondary | ICD-10-CM | POA: Diagnosis not present

## 2019-07-01 DIAGNOSIS — N925 Other specified irregular menstruation: Secondary | ICD-10-CM

## 2019-07-01 DIAGNOSIS — E669 Obesity, unspecified: Secondary | ICD-10-CM | POA: Diagnosis not present

## 2019-07-01 DIAGNOSIS — N912 Amenorrhea, unspecified: Secondary | ICD-10-CM

## 2019-07-01 LAB — POCT URINE PREGNANCY: Preg Test, Ur: NEGATIVE

## 2019-07-01 NOTE — Progress Notes (Signed)
Subjective:    Melanie Bell is a 29 y.o. female who presents for evaluation of amenorrhea. She believes she could be pregnant. Pregnancy is not desired. Sexual Activity: single partner, contraception: none. Current symptoms also include: positive home pregnancy test. Last period was abnormal.   Patient's last menstrual period was 04/30/2019 (approximate). The following portions of the patient's history were reviewed and updated as appropriate: allergies, current medications, past family history, past medical history, past social history, past surgical history and problem list.  Review of Systems Pertinent items are noted in HPI.     Objective:    BP 113/67   Pulse 80   Ht 5\' 6"  (1.676 m)   Wt 247 lb 3 oz (112.1 kg)   LMP 04/30/2019 (Approximate)   BMI 39.90 kg/m  General: alert, cooperative, appears stated age and no acute distress    Lab Review Urine HCG: negative    Assessment:    Absence of menstruation.     Plan:    Pregnancy Test: negative  Pt state she has been trying to get pregnant since November and has not had a period since December. Discussed work up for irregular periods. Blood work today, ultrasound ordered. Pt encouraged to return for annual exam as soon as able and to have her partner do semen analysis. She verbalizes and agrees to plan of care. Follow up as noted above.   I attest more than 50% of visit spent reviewing history, discussing work up for irregular periods, developing a plan of care and answering all questions. Face to face time 10 min.   Philip Aspen, CNM

## 2019-07-01 NOTE — Patient Instructions (Signed)
Abnormal Uterine Bleeding °Abnormal uterine bleeding means bleeding more than usual from your uterus. It can include: °· Bleeding between periods. °· Bleeding after sex. °· Bleeding that is heavier than normal. °· Periods that last longer than usual. °· Bleeding after you have stopped having your period (menopause). °There are many problems that may cause this. You should see a doctor for any kind of bleeding that is not normal. Treatment depends on the cause of the bleeding. °Follow these instructions at home: °· Watch your condition for any changes. °· Do not use tampons, douche, or have sex, if your doctor tells you not to. °· Change your pads often. °· Get regular well-woman exams. Make sure they include a pelvic exam and cervical cancer screening. °· Keep all follow-up visits as told by your doctor. This is important. °Contact a doctor if: °· The bleeding lasts more than one week. °· You feel dizzy at times. °· You feel like you are going to throw up (nauseous). °· You throw up. °Get help right away if: °· You pass out. °· You have to change pads every hour. °· You have belly (abdominal) pain. °· You have a fever. °· You get sweaty. °· You get weak. °· You passing large blood clots from your vagina. °Summary °· Abnormal uterine bleeding means bleeding more than usual from your uterus. °· There are many problems that may cause this. You should see a doctor for any kind of bleeding that is not normal. °· Treatment depends on the cause of the bleeding. °This information is not intended to replace advice given to you by your health care provider. Make sure you discuss any questions you have with your health care provider. °Document Revised: 04/26/2016 Document Reviewed: 04/26/2016 °Elsevier Patient Education © 2020 [employer] Inc. ° °

## 2019-07-02 ENCOUNTER — Other Ambulatory Visit: Payer: Self-pay | Admitting: Certified Nurse Midwife

## 2019-07-02 DIAGNOSIS — E221 Hyperprolactinemia: Secondary | ICD-10-CM

## 2019-07-02 LAB — LIPID PANEL
Chol/HDL Ratio: 2.8 ratio (ref 0.0–4.4)
Cholesterol, Total: 142 mg/dL (ref 100–199)
HDL: 50 mg/dL (ref >39)
LDL Chol Calc (NIH): 79 mg/dL (ref 0–99)
Triglycerides: 63 mg/dL (ref 0–149)
VLDL Cholesterol Cal: 13 mg/dL (ref 5–40)

## 2019-07-02 LAB — TESTOSTERONE: Testosterone: 37 ng/dL (ref 8–48)

## 2019-07-02 LAB — TSH: TSH: 2.34 u[IU]/mL (ref 0.450–4.500)

## 2019-07-02 LAB — FSH/LH
FSH: 2.1 m[IU]/mL
LH: 6.4 m[IU]/mL

## 2019-07-02 LAB — PROLACTIN: Prolactin: 66.3 ng/mL — ABNORMAL HIGH (ref 4.8–23.3)

## 2019-07-10 ENCOUNTER — Other Ambulatory Visit: Payer: Medicare Other

## 2019-07-24 ENCOUNTER — Other Ambulatory Visit: Payer: Self-pay

## 2019-07-24 ENCOUNTER — Ambulatory Visit (INDEPENDENT_AMBULATORY_CARE_PROVIDER_SITE_OTHER): Payer: Medicare Other

## 2019-07-24 DIAGNOSIS — N854 Malposition of uterus: Secondary | ICD-10-CM

## 2019-07-24 DIAGNOSIS — N912 Amenorrhea, unspecified: Secondary | ICD-10-CM

## 2019-07-24 DIAGNOSIS — R102 Pelvic and perineal pain: Secondary | ICD-10-CM | POA: Diagnosis not present

## 2019-07-24 DIAGNOSIS — N83292 Other ovarian cyst, left side: Secondary | ICD-10-CM | POA: Diagnosis not present

## 2019-07-29 ENCOUNTER — Other Ambulatory Visit: Payer: Self-pay

## 2019-07-30 ENCOUNTER — Other Ambulatory Visit: Payer: Self-pay | Admitting: Certified Nurse Midwife

## 2019-07-30 DIAGNOSIS — N838 Other noninflammatory disorders of ovary, fallopian tube and broad ligament: Secondary | ICD-10-CM

## 2019-07-31 ENCOUNTER — Ambulatory Visit (INDEPENDENT_AMBULATORY_CARE_PROVIDER_SITE_OTHER): Payer: Medicare Other | Admitting: Internal Medicine

## 2019-07-31 ENCOUNTER — Encounter: Payer: Self-pay | Admitting: Internal Medicine

## 2019-07-31 ENCOUNTER — Other Ambulatory Visit: Payer: Self-pay

## 2019-07-31 VITALS — BP 118/80 | HR 62 | Temp 98.2°F | Ht 66.0 in | Wt 257.0 lb

## 2019-07-31 DIAGNOSIS — E221 Hyperprolactinemia: Secondary | ICD-10-CM

## 2019-07-31 DIAGNOSIS — E038 Other specified hypothyroidism: Secondary | ICD-10-CM

## 2019-07-31 DIAGNOSIS — E039 Hypothyroidism, unspecified: Secondary | ICD-10-CM

## 2019-07-31 LAB — PROLACTIN: Prolactin: 40.2 ng/mL — ABNORMAL HIGH

## 2019-07-31 NOTE — Patient Instructions (Signed)
-   Please stop by the lab today  

## 2019-07-31 NOTE — Progress Notes (Signed)
[name]  MRN/ DOB: [id number], April 25, 1991    Age/ Sex: 29 y.o., female    PCP: Casilda Carls, MD   Reason for Endocrinology Evaluation: Elevated Prolactin      Date of Initial Endocrinology Evaluation: 08/02/2019     HPI: [name] is a 29 y.o. female with past medical history of bipolar disorder and anxiety . The patient presented for initial endocrinology clinic visit on 08/02/2019 for consultative assistance with her hyperprolactinemia      Pt was noted to have an elevated prolactin level at 66.3 ng/mL, during evaluation of secondary amenorrhea in 06/2019.   Patient has noted galactorrhea since 04/2019 , this has been improving  She does endorse history of elevated prolactin since her teenage years.  She has been diagnosed with bipolar disorder, PTSD and anxiety and has been on multiple antipsychotic medications over the years.  She has been on ox carbamazepine as well as risperidone in the past.  Her mood has been stable  Denies headaches or vision changes   LMP 07/30/2019    She was diagnosed with hypothyroidism during her teenage years , she was started on LT-for replacement since then which was continued  07/2019 as she had ran out and repeat testing was normal.     Was on trileptal at some point but has been off for ~3 yrs  Was on risperidone since she was a teenager   She is trying to conceive since 07/2019    Sister  And mother with hypothyroidism and  Sister with hyperthyroidism       HISTORY:  Past Medical History:  Past Medical History:  Diagnosis Date  . Anxiety   . Asthma   . Depression   . GERD (gastroesophageal reflux disease)   . Hallucinations 09/30/2014   Sizoaffective  . Hyperlipidemia   . Hypertension   . Intentional ingestion of batteries 04/21/2016    2 AAA batteries and 1 thumb tack; intent to hurt herself/notes 04/21/2016  . Tardive dyskinesia 10/2014   recent onset   Past Surgical History:  Past  Surgical History:  Procedure Laterality Date  . ABDOMINAL SURGERY     "years ago" to remove foreign objects  . APPENDECTOMY    . BREAST EXCISIONAL BIOPSY Right 09/03/2007   surgical bx removed fibroadeoma  . BREAST LUMPECTOMY Right   . COLONOSCOPY WITH PROPOFOL N/A 09/10/2015   Procedure: COLONOSCOPY WITH PROPOFOL;  Surgeon: [name], MD;  Location: [name] ENDOSCOPY;  Service: Endoscopy;  Laterality: N/A;  . ESOPHAGOGASTRODUODENOSCOPY N/A 11/28/2014   Procedure: ESOPHAGOGASTRODUODENOSCOPY (EGD);  Surgeon: [name], MD;  Location: [facility] ENDOSCOPY;  Service: Endoscopy;  Laterality: N/A;  . ESOPHAGOGASTRODUODENOSCOPY N/A 02/21/2016   Procedure: ESOPHAGOGASTRODUODENOSCOPY (EGD);  Surgeon: Mauri Pole, MD;  Location: [facility] ENDOSCOPY;  Service: Endoscopy;  Laterality: N/A;  . ESOPHAGOGASTRODUODENOSCOPY N/A 04/21/2016   Procedure: ESOPHAGOGASTRODUODENOSCOPY (EGD);  Surgeon: Gatha Mayer, MD;  Location[facility] ENDOSCOPY;  Service: Endoscopy;  Laterality: N/A;  . ESOPHAGOGASTRODUODENOSCOPY N/A 05/06/2016   Procedure: ESOPHAGOGASTRODUODENOSCOPY (EGD);  Surgeon: Jonathon Bellows, MD;  Location: [facility] ENDOSCOPY;  Service: Gastroenterology;  Laterality: N/A;  . ESOPHAGOGASTRODUODENOSCOPY N/A 06/13/2016   Procedure: ESOPHAGOGASTRODUODENOSCOPY (EGD);  Surgeon: [name], MD;  Location: [facility] ENDOSCOPY;  Service: Endoscopy;  Laterality: N/A;  . ESOPHAGOGASTRODUODENOSCOPY (EGD) WITH PROPOFOL N/A 02/29/2016   Procedure: ESOPHAGOGASTRODUODENOSCOPY (EGD) WITH PROPOFOL;  Surgeon: [name], MD;  Location: [facility] ENDOSCOPY;  Service: Endoscopy;  Laterality: N/A;  . ESOPHAGOGASTRODUODENOSCOPY (EGD) WITH PROPOFOL N/A 01/15/2018  Procedure: ESOPHAGOGASTRODUODENOSCOPY (EGD) WITH PROPOFOL;  Surgeon: Daneil Dolin, MD;  Location: [facility] ENDO SUITE;  Service: Endoscopy;  Laterality: N/A;  retrieval forein body  . LAPAROTOMY N/A 09/12/2015   Procedure: EXPLORATORY LAPAROTOMY;  Surgeon: Florene Glen, MD;  Location:  [facility] ORS;  Service: General;  Laterality: N/A;  . SIGMOIDOSCOPY N/A 09/12/2015   Procedure: Lonell Face;  Surgeon: Florene Glen, MD;  Location: [facility] ORS;  Service: General;  Laterality: N/A;  . WISDOM TOOTH EXTRACTION        Social History:  reports that she quit smoking about 2 years ago. Her smoking use included cigarettes. She has a 1.50 pack-year smoking history. She has never used smokeless tobacco. She reports that she does not drink alcohol or use drugs.  Family History: family history includes Asthma in her mother; Breast cancer in her maternal aunt; COPD in her mother; Depression in her mother; Diabetes in her mother; Hypertension in her mother; Sleep apnea in her mother.   HOME MEDICATIONS: Allergies as of 07/31/2019      Reactions   Betadine [povidone Iodine] Other (See Comments)   Reaction:  Unknown    Fish Allergy Hives   Iodine Rash   Shellfish-derived Products Other (See Comments)   Reaction:  Unknown       Medication List       Accurate as of July 31, 2019 11:59 PM. If you have any questions, ask your nurse or doctor.        STOP taking these medications   amLODipine 2.5 MG tablet Commonly known as: NORVASC Stopped by: Dorita Sciara, MD   cetirizine 10 MG tablet Commonly known as: ZYRTEC Stopped by: Dorita Sciara, MD   clozapine 50 MG tablet Commonly known as: CLOZARIL Stopped by: Dorita Sciara, MD   fluvoxaMINE 50 MG tablet Commonly known as: LUVOX Stopped by: Dorita Sciara, MD   hydrOXYzine 50 MG tablet Commonly known as: ATARAX/VISTARIL Stopped by: Dorita Sciara, MD   medroxyPROGESTERone 150 MG/ML injection Commonly known as: DEPO-PROVERA Stopped by: Dorita Sciara, MD   omeprazole 20 MG capsule Commonly known as: PRILOSEC Stopped by: Dorita Sciara, MD   oxcarbazepine 600 MG tablet Commonly known as: TRILEPTAL Stopped by: Dorita Sciara, MD   polyethylene glycol 17 g  packet Commonly known as: MIRALAX / GLYCOLAX Stopped by: Dorita Sciara, MD   senna 8.6 MG Tabs tablet Commonly known as: SENOKOT Stopped by: Dorita Sciara, MD   traZODone 50 MG tablet Commonly known as: DESYREL Stopped by: Dorita Sciara, MD   venlafaxine XR 150 MG 24 hr capsule Commonly known as: EFFEXOR-XR Stopped by: Dorita Sciara, MD   Vitamin D (Ergocalciferol) 1.25 MG (50000 UNIT) Caps capsule Commonly known as: DRISDOL Stopped by: Dorita Sciara, MD     TAKE these medications   escitalopram 5 MG tablet Commonly known as: LEXAPRO Take 5 mg by mouth every morning.   levothyroxine 50 MCG tablet Commonly known as: SYNTHROID Take 1 tablet (50 mcg total) by mouth daily. What changed:   medication strength  how much to take  when to take this Changed by: Dorita Sciara, MD   lithium carbonate 300 MG capsule Take 1 capsule (300 mg total) by mouth 2 (two) times daily with a meal.   paliperidone 6 MG 24 hr tablet Commonly known as: INVEGA Take 6 mg by mouth every morning.   prazosin 2 MG capsule Commonly known as: MINIPRESS Take 1 capsule (  2 mg total) by mouth at bedtime.   prazosin 5 MG capsule Commonly known as: MINIPRESS Take 5 mg by mouth at bedtime.         REVIEW OF SYSTEMS: A comprehensive ROS was conducted with the patient and is negative except as per HPI    OBJECTIVE:  VS: BP 118/80 (BP Location: Right Arm, Patient Position: Sitting, Cuff Size: Large)   Pulse 62   Temp 98.2 F (36.8 C)   Ht 5\' 6"  (1.676 m)   Wt 257 lb (116.6 kg)   LMP 07/30/2019 (Exact Date)   SpO2 98%   BMI 41.48 kg/m    Wt Readings from Last 3 Encounters:  07/31/19 257 lb (116.6 kg)  07/01/19 247 lb 3 oz (112.1 kg)  01/31/18 200 lb (90.7 kg)     EXAM: General: Pt appears well and is in NAD  Eyes: External eye exam normal without stare, lid lag or exophthalmos.  EOM intact  Neck: General: Supple without  adenopathy. Thyroid: Thyroid size normal.  No goiter or nodules appreciated. No thyroid bruit.  Lungs: Clear with good BS bilat with no rales, rhonchi, or wheezes  Heart: Auscultation: RRR.  Abdomen: Normoactive bowel sounds, soft, nontender, without masses or organomegaly palpable  Extremities:  BL LE: No pretibial edema normal ROM and strength.  Skin: Hair: Texture and amount normal with gender appropriate distribution Skin Inspection: No rashes Skin Palpation: Skin temperature, texture, and thickness normal to palpation  Neuro: Cranial nerves: II - XII grossly intact  Motor: Normal strength throughout DTRs: 2+ and symmetric in UE without delay in relaxation phase  Mental Status: Judgment, insight: Intact Orientation: Oriented to time, place, and person Mood and affect: No depression, anxiety, or agitation     DATA REVIEWED: Results for VALERIE, BIELER (MRN [id number]) as of 08/02/2019 09:26  Ref. Range 07/31/2019 15:32  Sodium Latest Ref Range: 135 - 145 mEq/L 139  Potassium Latest Ref Range: 3.5 - 5.1 mEq/L 4.1  Chloride Latest Ref Range: 96 - 112 mEq/L 105  CO2 Latest Ref Range: 19 - 32 mEq/L 31  Glucose Latest Ref Range: 70 - 99 mg/dL 81  BUN Latest Ref Range: 6 - 23 mg/dL 12  Creatinine Latest Ref Range: 0.40 - 1.20 mg/dL 0.68  Calcium Latest Ref Range: 8.4 - 10.5 mg/dL 9.7  Alkaline Phosphatase Latest Ref Range: 39 - 117 U/L 74  Albumin Latest Ref Range: 3.5 - 5.2 g/dL 3.8  AST Latest Ref Range: 0 - 37 U/L 16  ALT Latest Ref Range: 0 - 35 U/L 7  Total Protein Latest Ref Range: 6.0 - 8.3 g/dL 6.8  Total Bilirubin Latest Ref Range: 0.2 - 1.2 mg/dL 0.2  GFR Latest Ref Range: >60.00 mL/min 101.48  Prolactin Latest Units: ng/mL 40.2 (H)  TSH Latest Ref Range: 0.35 - 4.50 uIU/mL 4.77 (H)    Results for ELGENE, KOVALCHUK (MRN [id number]) as of 07/31/2019 12:02  Ref. Range 07/01/2019 11:27  LH Latest Units: mIU/mL 6.4  FSH Latest Units: mIU/mL 2.1  Prolactin Latest  Ref Range: 4.8 - 23.3 ng/mL 66.3 (H)  Testosterone Latest Ref Range: 8 - 48 ng/dL 37  TSH Latest Ref Range: 0.450 - 4.500 uIU/mL 2.340   ASSESSMENT/PLAN/RECOMMENDATIONS:   1. Hyperprolactinemia:    -Patient with chronic long-term history of intermittent hyperprolactinemia.  Part of this could be attributed to multiple antipsychotic medications that have she has been on since her teenage years, that are known to cause hyperprolactinemia such as risperidone. -Repeat  prolactin level has decreased but continues to be above normal and I suspect this is secondary to paliperidone as it is associated with a high frequency of prolactin elevation, I would recommend using another second-generation antipsychotic medication such as Aripiprazole, but I will defer this to her psychiatrist. -In the meantime given the chronicity of hyperprolactinemia I will proceed with an MRI and from there we will decide with the next step    2.  Subclinical Hypothyroidism  -Patient reports being on LT-4 replacement therapy since her teenage years, she had ran out of her medication by 06/2019 and was advised not to restart since her TFTs were normal. -Repeat TFTs today show elevated TSH with normal free T4, patient agrees to restart levothyroxine as below. You are on levothyroxine - which is your thyroid hormone supplement. You MUST take this consistently.  You should take this first thing in the morning on an empty stomach with water. You should not take it with other medications. Wait 25min to 1hr prior to eating. If you are taking any vitamins - please take these in the evening.   If you miss a dose, please take your missed dose the following day (double the dose for that day). You should have a pill box for ONLY levothyroxine on your bedside table to help you remember to take your medications.  Medication Levothyroxine 50 MCG daily  Addendum: Discussed lab results with the patient on 08/02/2019 at 8:20 AM   F/u in  3 months   Signed electronically by: Mack Guise, MD  [facility] Endocrinology  [facility] [address] [place], [place] [address] Phone: [phone] FAX: [phone]   CC: [address]ilda [address], [place] [address] Phone: [phone] Fax: [phone]   Return to Endocrinology clinic as below: Future Appointments  Date Time Provider [place]  08/21/2019 10:00 AM Philip Aspen, CNM [facility] None  10/31/2019 10:10 AM Hanah Moultry, Melanie Crazier, MD [facility] None

## 2019-08-01 LAB — COMPREHENSIVE METABOLIC PANEL
ALT: 7 U/L (ref 0–35)
AST: 16 U/L (ref 0–37)
Albumin: 3.8 g/dL (ref 3.5–5.2)
Alkaline Phosphatase: 74 U/L (ref 39–117)
BUN: 12 mg/dL (ref 6–23)
CO2: 31 mEq/L (ref 19–32)
Calcium: 9.7 mg/dL (ref 8.4–10.5)
Chloride: 105 mEq/L (ref 96–112)
Creatinine, Ser: 0.68 mg/dL (ref 0.40–1.20)
GFR: 101.48 mL/min (ref >60.00)
Glucose, Bld: 81 mg/dL (ref 70–99)
Potassium: 4.1 mEq/L (ref 3.5–5.1)
Sodium: 139 mEq/L (ref 135–145)
Total Bilirubin: 0.2 mg/dL (ref 0.2–1.2)
Total Protein: 6.8 g/dL (ref 6.0–8.3)

## 2019-08-01 LAB — TSH: TSH: 4.77 u[IU]/mL — ABNORMAL HIGH (ref 0.35–4.50)

## 2019-08-01 NOTE — Telephone Encounter (Signed)
Spoke with pt as we do not have a man on any of the guardianship paperwork or on the DPR in her file. PT did confirm we do not have updated information. I gave her the fax # for the accurate paperwork to be sent to [name]. Once this is received we can discuss with the guardian what he wants to discuss with [name].

## 2019-08-02 ENCOUNTER — Encounter: Payer: Self-pay | Admitting: Internal Medicine

## 2019-08-02 DIAGNOSIS — E038 Other specified hypothyroidism: Secondary | ICD-10-CM | POA: Insufficient documentation

## 2019-08-02 DIAGNOSIS — E039 Hypothyroidism, unspecified: Secondary | ICD-10-CM | POA: Insufficient documentation

## 2019-08-02 DIAGNOSIS — E221 Hyperprolactinemia: Secondary | ICD-10-CM | POA: Insufficient documentation

## 2019-08-02 MED ORDER — LEVOTHYROXINE SODIUM 50 MCG PO TABS: 50.0000 ug | ORAL_TABLET | Freq: Every day | ORAL | 3 refills | Status: DC

## 2019-08-21 ENCOUNTER — Ambulatory Visit (INDEPENDENT_AMBULATORY_CARE_PROVIDER_SITE_OTHER): Payer: Medicare Other | Admitting: Certified Nurse Midwife

## 2019-08-21 ENCOUNTER — Other Ambulatory Visit: Payer: Self-pay

## 2019-08-21 ENCOUNTER — Other Ambulatory Visit (HOSPITAL_COMMUNITY)
Admission: RE | Admit: 2019-08-21 | Discharge: 2019-08-21 | Disposition: A | Discharge status: Home / Self Care | Payer: Medicare Other | Source: Ambulatory Visit | Attending: Certified Nurse Midwife | Admitting: Certified Nurse Midwife

## 2019-08-21 ENCOUNTER — Encounter: Payer: Self-pay | Admitting: Certified Nurse Midwife

## 2019-08-21 VITALS — BP 112/78 | HR 71 | Ht 66.0 in | Wt 253.2 lb

## 2019-08-21 DIAGNOSIS — Z124 Encounter for screening for malignant neoplasm of cervix: Secondary | ICD-10-CM | POA: Diagnosis not present

## 2019-08-21 DIAGNOSIS — Z01419 Encounter for gynecological examination (general) (routine) without abnormal findings: Secondary | ICD-10-CM | POA: Diagnosis not present

## 2019-08-21 DIAGNOSIS — Z113 Encounter for screening for infections with a predominantly sexual mode of transmission: Secondary | ICD-10-CM | POA: Diagnosis not present

## 2019-08-21 DIAGNOSIS — N838 Other noninflammatory disorders of ovary, fallopian tube and broad ligament: Secondary | ICD-10-CM

## 2019-08-21 DIAGNOSIS — Z1151 Encounter for screening for human papillomavirus (HPV): Secondary | ICD-10-CM | POA: Insufficient documentation

## 2019-08-21 DIAGNOSIS — Z23 Encounter for immunization: Secondary | ICD-10-CM

## 2019-08-21 MED ORDER — TETANUS-DIPHTH-ACELL PERTUSSIS 5-2.5-18.5 LF-MCG/0.5 IM SUSP
0.5000 mL | Freq: Once | INTRAMUSCULAR | Status: AC
  Administered 2019-08-21: 11:00:00 0.5 mL via INTRAMUSCULAR

## 2019-08-21 NOTE — Progress Notes (Signed)
GYNECOLOGY ANNUAL PREVENTATIVE CARE ENCOUNTER NOTE  History:     Rahf Rosdahl is a 29 y.o. G0P0000 female here for a routine annual gynecologic exam.  Current complaints: none   Denies abnormal vaginal bleeding, discharge, pelvic pain, problems with intercourse or other gynecologic concerns.    Gynecologic History Patient's last menstrual period was 07/30/2019 (exact date). Contraception: none Last Pap: 08/04/2017. Results were: abnormal with negative HPV Last mammogram: n/a.  Obstetric History OB History  Gravida Para Term Preterm AB Living  0 0 0 0 0 0  SAB TAB Ectopic Multiple Live Births  0 0 0 0      Past Medical History:  Diagnosis Date  . Anxiety   . Asthma   . Depression   . GERD (gastroesophageal reflux disease)   . Hallucinations 09/30/2014   Sizoaffective  . Hyperlipidemia   . Hypertension   . Intentional ingestion of batteries 04/21/2016    2 AAA batteries and 1 thumb tack; intent to hurt herself/notes 04/21/2016  . Tardive dyskinesia 10/2014   recent onset    Past Surgical History:  Procedure Laterality Date  . ABDOMINAL SURGERY     "years ago" to remove foreign objects  . APPENDECTOMY    . BREAST EXCISIONAL BIOPSY Right 09/03/2007   surgical bx removed fibroadeoma  . BREAST LUMPECTOMY Right   . COLONOSCOPY WITH PROPOFOL N/A 09/10/2015   Procedure: COLONOSCOPY WITH PROPOFOL;  Surgeon: [name], MD;  Location: [facility] ENDOSCOPY;  Service: Endoscopy;  Laterality: N/A;  . ESOPHAGOGASTRODUODENOSCOPY N/A 11/28/2014   Procedure: ESOPHAGOGASTRODUODENOSCOPY (EGD);  Surgeon: [name], MD;  Location: [facility] ENDOSCOPY;  Service: Endoscopy;  Laterality: N/A;  . ESOPHAGOGASTRODUODENOSCOPY N/A 02/21/2016   Procedure: ESOPHAGOGASTRODUODENOSCOPY (EGD);  Surgeon: Mauri Pole, MD;  Location: [facility] ENDOSCOPY;  Service: Endoscopy;  Laterality: N/A;  . ESOPHAGOGASTRODUODENOSCOPY N/A 04/21/2016   Procedure: ESOPHAGOGASTRODUODENOSCOPY (EGD);  Surgeon:  Gatha Mayer, MD;  Location: [facility] ENDOSCOPY;  Service: Endoscopy;  Laterality: N/A;  . ESOPHAGOGASTRODUODENOSCOPY N/A 05/06/2016   Procedure: ESOPHAGOGASTRODUODENOSCOPY (EGD);  Surgeon: Jonathon Bellows, MD;  Location: [facility] ENDOSCOPY;  Service: Gastroenterology;  Laterality: N/A;  . ESOPHAGOGASTRODUODENOSCOPY N/A 06/13/2016   Procedure: ESOPHAGOGASTRODUODENOSCOPY (EGD);  Surgeon: [name], MD;  Location: [facility] ENDOSCOPY;  Service: Endoscopy;  Laterality: N/A;  . ESOPHAGOGASTRODUODENOSCOPY (EGD) WITH PROPOFOL N/A 02/29/2016   Procedure: ESOPHAGOGASTRODUODENOSCOPY (EGD) WITH PROPOFOL;  Surgeon: [name], MD;  Location: [facility] ENDOSCOPY;  Service: Endoscopy;  Laterality: N/A;  . ESOPHAGOGASTRODUODENOSCOPY (EGD) WITH PROPOFOL N/A 01/15/2018   Procedure: ESOPHAGOGASTRODUODENOSCOPY (EGD) WITH PROPOFOL;  Surgeon: Daneil Dolin, MD;  [facility];  Service: Endoscopy;  Laterality: N/A;  retrieval forein body  . LAPAROTOMY N/A 09/12/2015   Procedure: EXPLORATORY LAPAROTOMY;  Surgeon: Florene Glen, MD;  Location: [facility] ORS;  Service: General;  Laterality: N/A;  . SIGMOIDOSCOPY N/A 09/12/2015   Procedure: Lonell Face;  Surgeon: Florene Glen, MD;  Location: [facility] ORS;  Service: General;  Laterality: N/A;  . WISDOM TOOTH EXTRACTION      Current Outpatient Medications on File Prior to Visit  Medication Sig Dispense Refill  . escitalopram (LEXAPRO) 5 MG tablet Take 5 mg by mouth every morning.    Marland Kitchen levothyroxine (SYNTHROID) 50 MCG tablet Take 1 tablet (50 mcg total) by mouth daily. 90 tablet 3  . lithium carbonate 300 MG capsule Take 1 capsule (300 mg total) by mouth 2 (two) times daily with a meal. 60 capsule 1  . paliperidone (INVEGA) 6 MG 24 hr tablet  Take 6 mg by mouth every morning.    . prazosin (MINIPRESS) 2 MG capsule Take 1 capsule (2 mg total) by mouth at bedtime. (Patient not taking: Reported on 07/01/2019) 30 capsule 0  . prazosin (MINIPRESS) 5 MG capsule Take 5 mg by mouth at  bedtime.     No current facility-administered medications on file prior to visit.    Allergies  Allergen Reactions  . Betadine [Povidone Iodine] Other (See Comments)    Reaction:  Unknown   . Fish Allergy Hives  . Iodine Rash  . Shellfish-Derived Products Other (See Comments)    Reaction:  Unknown     Social History:  reports that she quit smoking about 3 years ago. Her smoking use included cigarettes. She has a 1.50 pack-year smoking history. She has never used smokeless tobacco. She reports that she does not drink alcohol or use drugs.  Family History  Problem Relation Age of Onset  . Depression Mother   . Hypertension Mother   . Sleep apnea Mother   . Asthma Mother   . COPD Mother   . Diabetes Mother   . Breast cancer Maternal Aunt        20's    The following portions of the patient's history were reviewed and updated as appropriate: allergies, current medications, past family history, past medical history, past social history, past surgical history and problem list.  Review of Systems Pertinent items noted in HPI and remainder of comprehensive ROS otherwise negative.  Physical Exam:  BP 112/78   Pulse 71   Ht 5\' 6"  (1.676 m)   Wt 253 lb 4 oz (114.9 kg)   LMP 07/30/2019 (Exact Date)   BMI 40.88 kg/m  CONSTITUTIONAL: Well-developed, well-nourished, obese female in no acute distress.  HENT:  Normocephalic, atraumatic, External right and left ear normal. Oropharynx is clear and moist EYES: Conjunctivae and EOM are normal. Pupils are equal, round, and reactive to light. No scleral icterus.  NECK: Normal range of motion, supple, no masses.  Normal thyroid.  SKIN: Skin is warm and dry. No rash noted. Not diaphoretic. No erythema. No pallor. MUSCULOSKELETAL: Normal range of motion. No tenderness.  No cyanosis, clubbing, or edema.  2+ distal pulses. NEUROLOGIC: Alert and oriented to person, place, and time. Normal reflexes, muscle tone coordination.  PSYCHIATRIC: Normal  mood and affect. Normal behavior. Normal judgment and thought content. CARDIOVASCULAR: Normal heart rate noted, regular rhythm RESPIRATORY: Clear to auscultation bilaterally. Effort and breath sounds normal, no problems with respiration noted. BREASTS: Symmetric in size. No masses, tenderness, skin changes, nipple drainage, or lymphadenopathy bilaterally. Performed in the presence of a chaperone. ABDOMEN: Soft, no distention noted.  No tenderness, rebound or guarding.  PELVIC: Normal appearing external genitalia and urethral meatus; normal appearing vaginal mucosa and cervix.  No abnormal discharge noted.  Pap smear obtained.  Normal uterine size, no other palpable masses, no uterine or adnexal tenderness.  Performed in the presence of a chaperone.   Assessment and Plan:    1. Ovarian mass, left  - US PELVIC COMPLETE WITH TRANSVAGINAL  2. Women's annual routine gynecological examination  Will follow up results of pap smear and manage accordingly. Mammogram not indicated Labs: none, pt declines HIV due to cost.  Refill: none Routine preventative health maintenance measures emphasized. Please refer to After Visit Summary for other counseling recommendations.      Philip Aspen, CNM

## 2019-08-21 NOTE — Patient Instructions (Signed)
Preventive Care 21-30 Years Old, Female Preventive care refers to visits with your health care provider and lifestyle choices that can promote health and wellness. This includes:  A yearly physical exam. This may also be called an annual well check.  Regular dental visits and eye exams.  Immunizations.  Screening for certain conditions.  Healthy lifestyle choices, such as eating a healthy diet, getting regular exercise, not using drugs or products that contain nicotine and tobacco, and limiting alcohol use. What can I expect for my preventive care visit? Physical exam Your health care provider will check your:  Height and weight. This may be used to calculate body mass index (BMI), which tells if you are at a healthy weight.  Heart rate and blood pressure.  Skin for abnormal spots. Counseling Your health care provider may ask you questions about your:  Alcohol, tobacco, and drug use.  Emotional well-being.  Home and relationship well-being.  Sexual activity.  Eating habits.  Work and work environment.  Method of birth control.  Menstrual cycle.  Pregnancy history. What immunizations do I need?  Influenza (flu) vaccine  This is recommended every year. Tetanus, diphtheria, and pertussis (Tdap) vaccine  You may need a Td booster every 10 years. Varicella (chickenpox) vaccine  You may need this if you have not been vaccinated. Human papillomavirus (HPV) vaccine  If recommended by your health care provider, you may need three doses over 6 months. Measles, mumps, and rubella (MMR) vaccine  You may need at least one dose of MMR. You may also need a second dose. Meningococcal conjugate (MenACWY) vaccine  One dose is recommended if you are age 19-21 years and a first-year college student living in a residence hall, or if you have one of several medical conditions. You may also need additional booster doses. Pneumococcal conjugate (PCV13) vaccine  You may need  this if you have certain conditions and were not previously vaccinated. Pneumococcal polysaccharide (PPSV23) vaccine  You may need one or two doses if you smoke cigarettes or if you have certain conditions. Hepatitis A vaccine  You may need this if you have certain conditions or if you travel or work in places where you may be exposed to hepatitis A. Hepatitis B vaccine  You may need this if you have certain conditions or if you travel or work in places where you may be exposed to hepatitis B. Haemophilus influenzae type b (Hib) vaccine  You may need this if you have certain conditions. You may receive vaccines as individual doses or as more than one vaccine together in one shot (combination vaccines). Talk with your health care provider about the risks and benefits of combination vaccines. What tests do I need?  Blood tests  Lipid and cholesterol levels. These may be checked every 5 years starting at age 20.  Hepatitis C test.  Hepatitis B test. Screening  Diabetes screening. This is done by checking your blood sugar (glucose) after you have not eaten for a while (fasting).  Sexually transmitted disease (STD) testing.  BRCA-related cancer screening. This may be done if you have a family history of breast, ovarian, tubal, or peritoneal cancers.  Pelvic exam and Pap test. This may be done every 3 years starting at age 21. Starting at age 30, this may be done every 5 years if you have a Pap test in combination with an HPV test. Talk with your health care provider about your test results, treatment options, and if necessary, the need for more tests.   Follow these instructions at home: Eating and drinking   Eat a diet that includes fresh fruits and vegetables, whole grains, lean protein, and low-fat dairy.  Take vitamin and mineral supplements as recommended by your health care provider.  Do not drink alcohol if: ? Your health care provider tells you not to drink. ? You are  pregnant, may be pregnant, or are planning to become pregnant.  If you drink alcohol: ? Limit how much you have to 0-1 drink a day. ? Be aware of how much alcohol is in your drink. In the [place] one drink equals one 12 oz bottle of beer (355 mL), one 5 oz glass of wine (148 mL), or one 1 oz glass of hard liquor (44 mL). Lifestyle  Take daily care of your teeth and gums.  Stay active. Exercise for at least 30 minutes on 5 or more days each week.  Do not use any products that contain nicotine or tobacco, such as cigarettes, e-cigarettes, and chewing tobacco. If you need help quitting, ask your health care provider.  If you are sexually active, practice safe sex. Use a condom or other form of birth control (contraception) in order to prevent pregnancy and STIs (sexually transmitted infections). If you plan to become pregnant, see your health care provider for a preconception visit. What's next?  Visit your health care provider once a year for a well check visit.  Ask your health care provider how often you should have your eyes and teeth checked.  Stay up to date on all vaccines. This information is not intended to replace advice given to you by your health care provider. Make sure you discuss any questions you have with your health care provider. Document Revised: 01/11/2018 Document Reviewed: 01/11/2018 Elsevier Patient Education  2020 [employer].

## 2019-08-26 LAB — CYTOLOGY - PAP
Comment: NEGATIVE
Diagnosis: NEGATIVE
High risk HPV: NEGATIVE

## 2019-09-09 ENCOUNTER — Other Ambulatory Visit: Payer: Self-pay

## 2019-09-09 ENCOUNTER — Ambulatory Visit
Admission: RE | Admit: 2019-09-09 | Discharge: 2019-09-09 | Disposition: A | Discharge status: Home / Self Care | Payer: Medicare Other | Source: Ambulatory Visit | Attending: Internal Medicine | Admitting: Internal Medicine

## 2019-09-09 DIAGNOSIS — E221 Hyperprolactinemia: Secondary | ICD-10-CM

## 2019-09-09 IMAGING — MR MR HEAD WO/W CM
18 series · 48 of 48 positions shown · IV contrast (multihance)
Comparison: Head CT [DATE]

CLINICAL DATA: Hyperprolactinemia.

EXAM:
MRI HEAD WITHOUT AND WITH CONTRAST
TECHNIQUE: Multiplanar, multiecho pulse sequences of the brain and surrounding
structures were obtained without and with intravenous contrast.
CONTRAST:  10mL MULTIHANCE GADOBENATE DIMEGLUMINE 529 MG/ML IV SOLN

[Series 5: T1 · sagittal · 4.0mm · 0.72mm/px · 3 of 27 slices shown (1 of 4)]
[im 1/27]
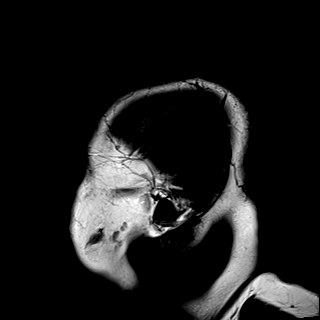
[im 14/27]
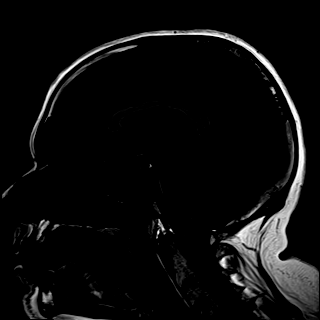
[im 27/27]
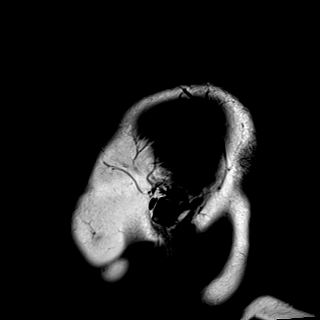

[Series 6: DWI · axial · 3.0mm · 1.44mm/px · z∈[-35,+116]mm · 7 of 84 slices shown (1 of 2)]
[im 1/84]
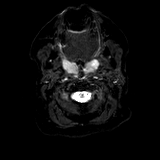
[im 14/84]
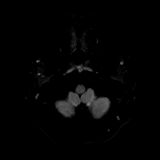
[im 28/84]
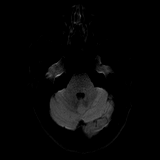
[im 42/84]
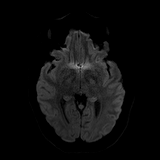
[im 56/84]
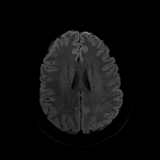
[im 70/84]
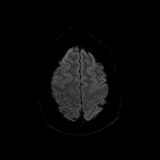
[im 84/84]
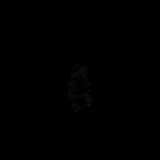

[Series 7: DWI · axial · 3.0mm · 1.44mm/px · z∈[-35,+112]mm · 3 of 41 slices shown (2 of 2)]
[im 1/41]
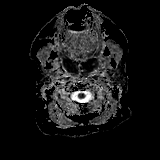
[im 21/41]
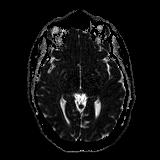
[im 41/41]
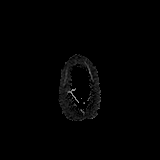

[Series 8: T2 · axial · 4.0mm · 0.36mm/px · z∈[-25,+107]mm · 2 of 27 slices shown]
[im 1/27]
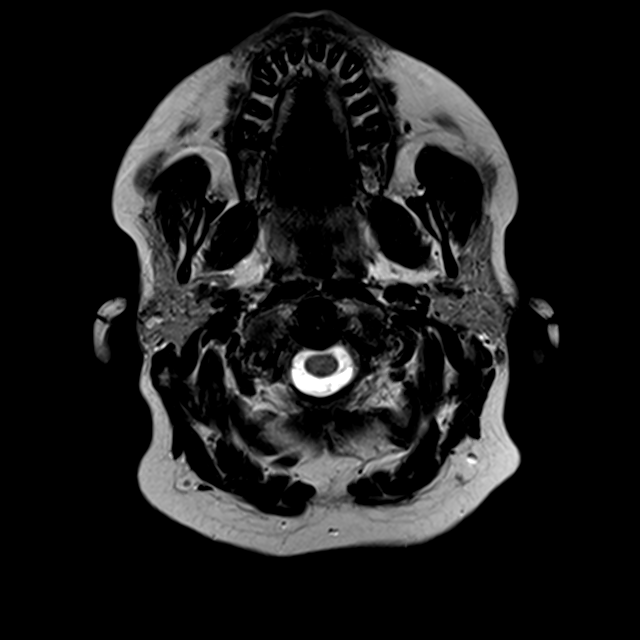
[im 27/27]
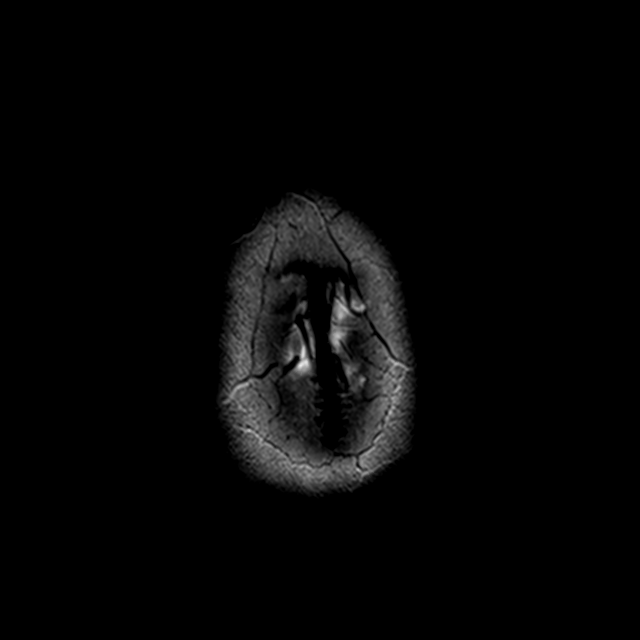

[Series 10: swi_images · axial · 2.0mm · 0.90mm/px · z∈[-24,+109]mm · 6 of 72 slices shown]
[im 1/72]
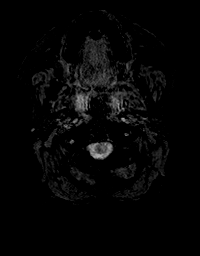
[im 15/72]
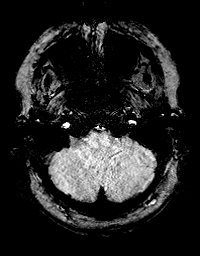
[im 29/72]
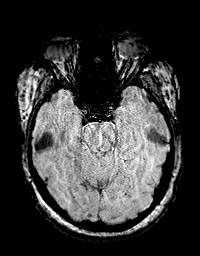
[im 43/72]
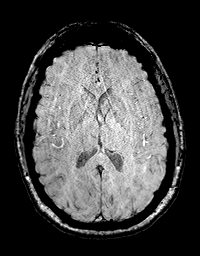
[im 57/72]
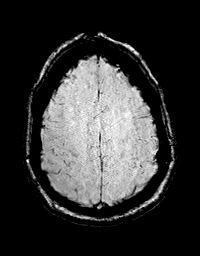
[im 72/72]
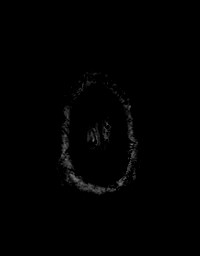

[Series 11: FLAIR · axial · 3.0mm · 0.72mm/px · z∈[-24,+106]mm · 2 of 24 slices shown]
[im 1/24]
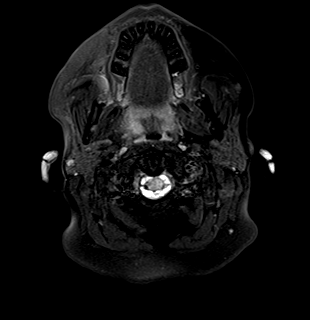
[im 24/24]
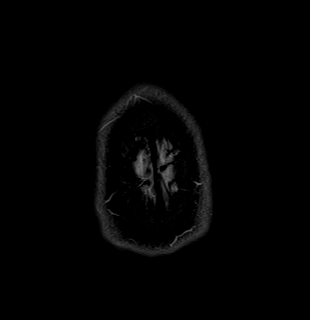

[Series 12: T1 · sagittal · 3.0mm · 0.42mm/px · 1 of 13 slices shown (2 of 4)]
[im 1/13]
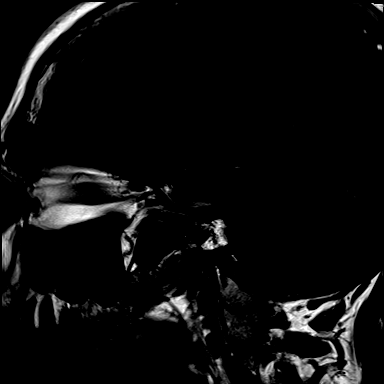

[Series 13: T1 · coronal · 3.0mm · 0.42mm/px · 1 of 10 slices shown (3 of 4)]
[im 1/10]
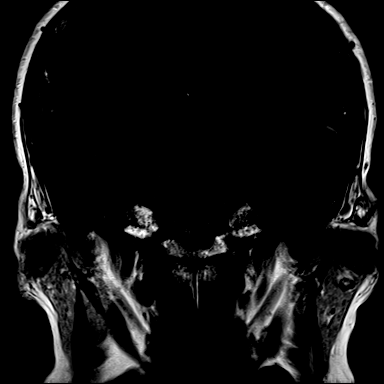

[Series 14: t1_vibe_cor · coronal · 2.0mm · 0.50mm/px · 1 of 12 slices shown]
[im 1/12]
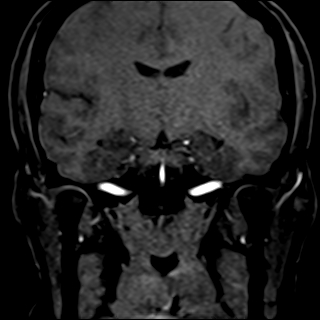

[Series 15: t1_vibe_cor_dynamic · coronal · 2.0mm · 0.50mm/px · 1 of 12 slices shown (1 of 5)]
[im 1/12]
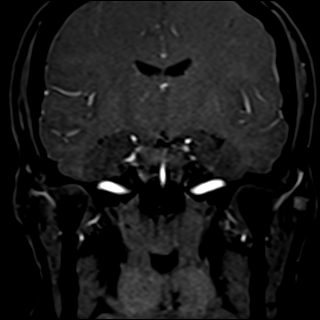

[Series 16: t1_vibe_cor_dynamic · coronal · 2.0mm · 0.50mm/px · 1 of 12 slices shown (2 of 5)]
[im 1/12]
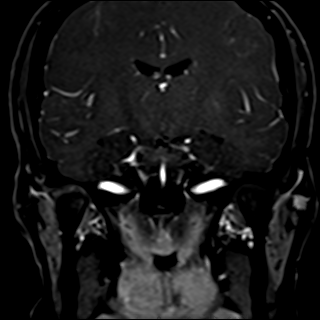

[Series 17: t1_vibe_cor_dynamic · coronal · 2.0mm · 0.50mm/px · 1 of 12 slices shown (3 of 5)]
[im 1/12]
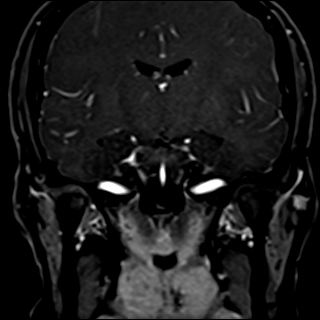

[Series 18: t1_vibe_cor_dynamic · coronal · 2.0mm · 0.50mm/px · 1 of 12 slices shown (4 of 5)]
[im 1/12]
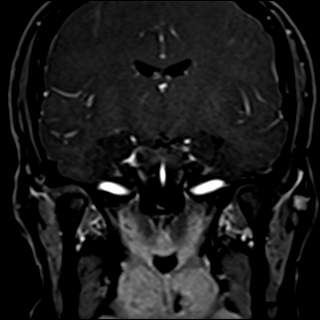

[Series 19: t1_vibe_cor_dynamic · coronal · 2.0mm · 0.50mm/px · 1 of 12 slices shown (5 of 5)]
[im 1/12]
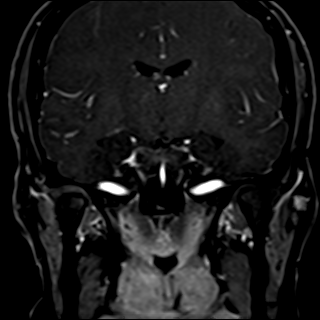

[Series 20: T1 post-contrast · sagittal · 3.0mm · 0.42mm/px · 1 of 13 slices shown (1 of 3)]
[im 1/13]
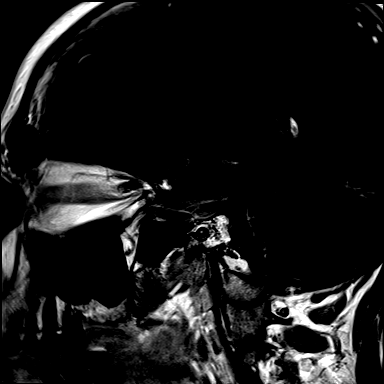

[Series 21: T1 post-contrast · coronal · 3.0mm · 0.42mm/px · 1 of 10 slices shown (2 of 3)]
[im 1/10]
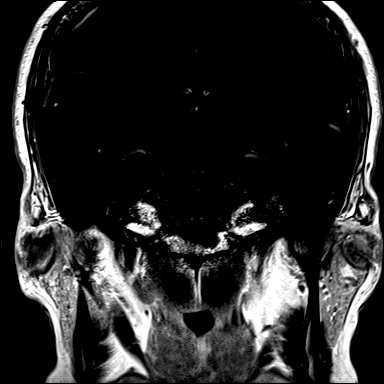

[Series 22: T1 · axial · 1.0mm · 0.86mm/px · z∈[-27,+108]mm · 12 of 144 slices shown (4 of 4)]
[im 1/144]
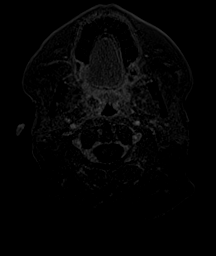
[im 14/144]
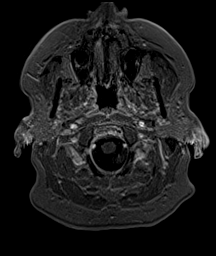
[im 27/144]
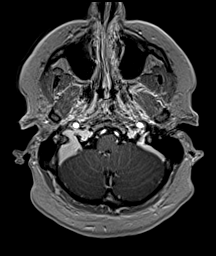
[im 40/144]
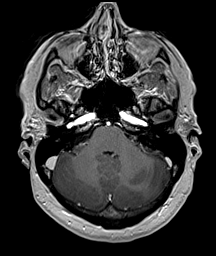
[im 53/144]
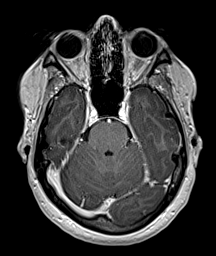
[im 66/144]
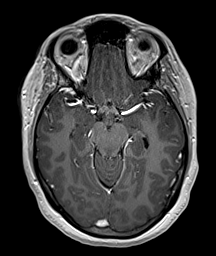
[im 79/144]
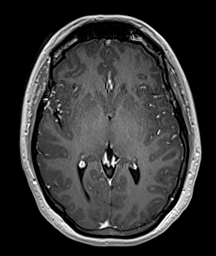
[im 92/144]
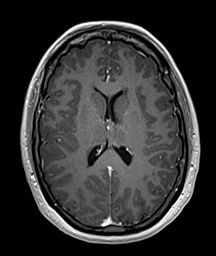
[im 105/144]
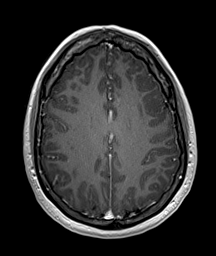
[im 118/144]
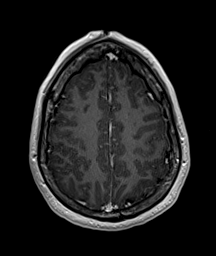
[im 131/144]
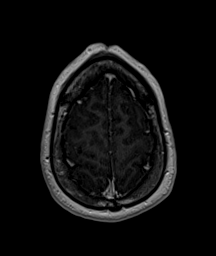
[im 144/144]
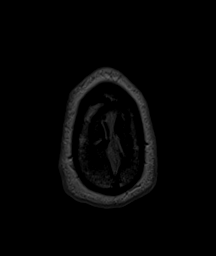

[Series 23: T1 post-contrast · coronal · 4.0mm · 0.47mm/px · 3 of 30 slices shown (3 of 3)]
[im 1/30]
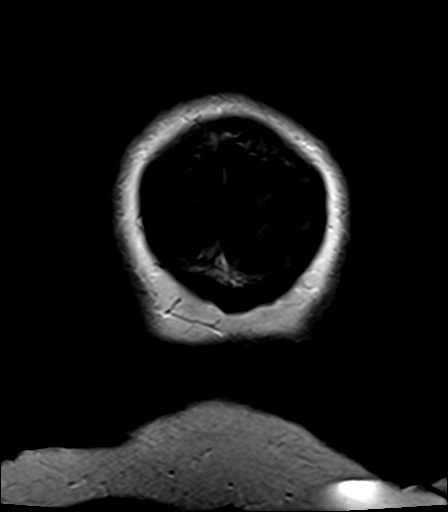
[im 15/30]
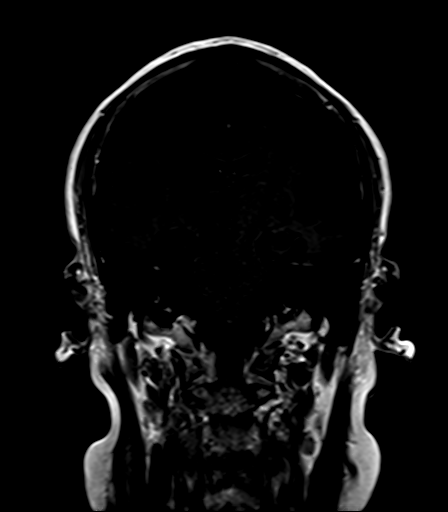
[im 30/30]
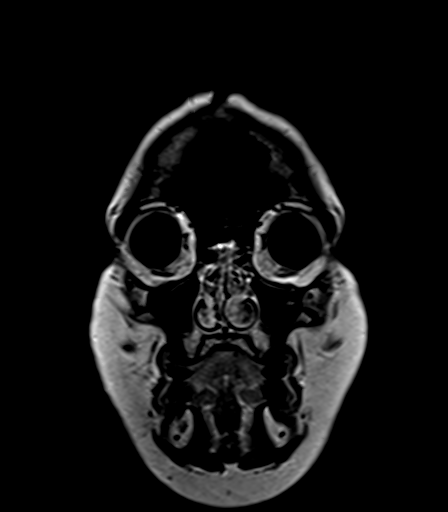

[48 of 48 positions shown; findings below may reference images not displayed]

FINDINGS: Brain: No acute infarction, hemorrhage, hydrocephalus, extra-axial
collection or mass lesion.

Scattered foci of T2 hyperintensity are seen within the white matter
of the cerebral hemispheres predominantly deep and juxtacortical
white matter. No posterior fossa lesion identified. None of the
lesions show enhancement after the intravenous administration of
contrast.

Pituitary/Sella: The pituitary gland is normal in appearance and
size for age without mass lesion. Mildly heterogeneous enhancement
in the dynamic phase with homogeneous enhancement on the delayed is
within normal limits. A normal posterior pituitary bright spot is
seen. The infundibulum is midline. The hypothalamus and mamillary
bodies are normal. There is no mass effect on the optic chiasm or
optic nerves. The infundibular and chiasmatic recesses are clear.
Normal cavernous sinus and cavernous internal carotid artery flow
voids.

Vascular: Normal flow voids.

Skull and upper cervical spine: Normal marrow signal.

Sinuses/Orbits: Mild mucosal thickening throughout the paranasal
sinuses. The orbits are maintained.

Other: Mildly prominent adenoid with retention cysts.
IMPRESSION: 1. Normal MRI of the pituitary gland with and without contrast.
2. Scattered foci of T2 hyperintensity predominantly within the deep
and juxtacortical white matter of the cerebral hemispheres are
nonspecific and more pronounced than expected for patient's age.
Differential diagnosis is broad and include demyelinating disease,
vasculitis, CADASIL in the setting of chronic migraine headache, and
other inflammatory/post infectious processes.

## 2019-09-09 MED ORDER — GADOBENATE DIMEGLUMINE 529 MG/ML IV SOLN
10.0000 mL | Freq: Once | INTRAVENOUS | Status: AC | PRN
  Administered 2019-09-09: 10 mL via INTRAVENOUS

## 2019-09-10 ENCOUNTER — Encounter: Payer: Self-pay | Admitting: Internal Medicine

## 2019-09-10 ENCOUNTER — Telehealth: Payer: Self-pay | Admitting: Internal Medicine

## 2019-09-11 ENCOUNTER — Telehealth: Payer: Self-pay | Admitting: Internal Medicine

## 2019-09-11 ENCOUNTER — Encounter: Payer: Self-pay | Admitting: Neurology

## 2019-09-11 DIAGNOSIS — R9089 Other abnormal findings on diagnostic imaging of central nervous system: Secondary | ICD-10-CM

## 2019-09-11 NOTE — Telephone Encounter (Signed)
Received a portal message from the pt approving neurology referral to further investigate her brain MRI.     Referral entered.    Melanie Nena Jordan, MD  [facility]  [facility] [address], [place] [address] Phone: [phone] FAX: [phone]

## 2019-09-12 NOTE — Telephone Encounter (Signed)
Order encouranter

## 2019-09-18 NOTE — Progress Notes (Signed)
NEUROLOGY CONSULTATION NOTE  Marilyne Tienda MRN: [id number] DOB: 12/25/1990  Referring provider: Casilda Carls, MD Primary care provider: Casilda Carls, MD  Reason for consult:  Abnormal brain MRI  HISTORY OF PRESENT ILLNESS: Flara Besler. [name] is a 29 year old right-handed white female with Bipolar disorder, PTSD and depression, tardive dyskinesia, hyperprolactinemia, hypertension and hyperlipidemia who presents for abnormal white matter on brain MRI.  History supplemented by internal medicine and endocrinology notes.  MRI of brain/pituitary on 09/09/2019 personally reviewed.  She has diagnosis of Bipolar disorder, PTSD and depression with longstanding history of multiple antipsychotic and neuroleptic medications.  She was diagnosed with hyperprolactinemia in February after experiencing galactorrhea and amenorrhea.  She underwent MRI of brain/pitutiary with and without contrast on 09/09/2019 which was negative for pituitary adenoma but demonstrated scattered non-enhancing T2 hyperintense foci in the deep and juxtacortical white matter of the cerebral hemispheres.  She presents for further evaluation.  She was found to have abnormal white matter changes on brain MRI at age 48.  She was checked following brain injury.  She underwent MS workup.  MRI of cervical spine was negative for lesions.  She underwent lumbar puncture which was reportedly unremarkable.  Over the next few years, between 2008 and 2012, she had routine follow up brain MRIs which were reportedly stable.  She never had prior neurologic events such as ataxia, numbness, weakness or vision loss.  She sees the eye doctor routinely.  She has history of headaches, consistent with migraines (severe, nausea, photophobia, phonophobia).    Family history is notable for lupus with 2 maternal aunts and a maternal aunt.  Her mother may have lupus.  No family history of MS.  Imaging: 02/22/2007 MRI BRAIN W WO:  FINDINGS: There are  scattered foci of predominantly subcortical T2/FLAIR signal abnormality, more confluent posteriorly in the parieto-occipital regions. No enhancing lesions are identified.  There is no mass effect or midline shift. There is no evidence of intracranial hemorrhage or acute infarct.  IMPRESSION:   Nonspecific white matter disease, predominantly subcortical and more confluent posteriorly. This is a nonspecific finding and may reflect demyelinating disease, an infectious/inflammatory process, or less likely vasculitis. 03/15/2007 MRI BRAIN W WO:  FINDINGS: There are scattered foci of predominantly subcortical T2/FLAIR signal abnormality, more confluent posteriorly in the parieto-occipital regions which are not significantly changed. No enhancing lesions are identified.  An 8 mm defect in the anterior midline frontal subcutaneous fat is again seen near the vertex and of uncertain etiology.  There is no evidence of intracranial hemorrhage, acute infarct, mass lesion, mass effect, or extra-axial fluid collection.  IMPRESSION: 1. Stable nonspecific white matter disease, predominantly subcortical and periventricular, and more confluent posteriorly. This is a nonspecific finding and may reflect demyelinating disease. However , lupus as well as vasculitis are also within the differential.  2. Stable defect in the anterior midline frontal subcutaneous fat near the vertex. Direct visualization is recommended 03/15/2007 MRI CERVICAL & THORACIC W WO:  FINDINGS:  The study is significantly degraded by patient motion artifact.  CERVICAL: The vertebral bodies demonstrate normal height, signal and alignment. The intervertebral discs are normal. The cervical cord demonstrates patchy areas of increased T2 signal within the posterior aspect of the cord at the level of C2 and C3. A portion of this may be artifactual due to patient motion on this exam. No central canal or neural foraminal stenosis. No abnormal enhancement is  identified.  THORACIC: The vertebral bodies demonstrate normal height, signal and alignment.  The intervertebral discs are normal. The thoracic cord is normal in appearance. No central canal or neural foraminal stenosis. No abnormal enhancement is Identified.  IMPRESSION: Limited exam due to patient motion with abnormal increased T2 signal in the cervical spinal cord without abnormal enhancement. 10/31/2007 MRI BRAIN W WO:  FINDINGS: There are multiple white matter foci of abnormal increased T2/FLAIR signal. There are greater than nine lesions, predominately involving the subcortical white matter, more confluent posteriorly in the parietal occipital regions. There has been no significant changed in distribution since the prior study. No enhancing lesions are identified. There are no infratentorial lesions. The optic nerves are normal in appearance.  There is no evidence of intracranial hemorrhage, acute infarct, mass effect, or extra-axial fluid collection. The subcutaneous lesion in the midline frontal scalp is unchanged.  IMPRESSION:  No significant interval change in nonspecific white matter disease. 10/31/2019 MRI CERVICAL W WO:  FINDINGS: The vertebral bodies are normally aligned.  The signal intensity from the vertebral body bone marrow and spinal cord is normal. There is no significant spinal canal or neuroforaminal stenosis. No definite abnormal signal is identified in the cervical spine. No enhancing lesions are identified.  IMPRESSION: No definite cervical spine signal abnormality or enhancing lesion. 11/11/2008 MRI BRAIN W WO:  FINDINGS: There are multiple white matter foci of abnormal increased T2/FLAIR signal. The distribution of the foci of FLAIR signal abnormalities is predominantly within the white matter tracts and there are no lesions identified perpendicular to the lateral ventricles. There are several subcortical lesions. There are no infratentorial lesions. No enhancing lesions  are seen.  The optic nerves are normal in appearance.  There is no evidence of intracranial hemorrhage, acute infarct, mass effect, or extra-axial fluid collection.  The previously noted subcutaneous lesion overlying the frontal scalp is unchanged.  IMPRESSION:  Multiple white matter lesions in a nonspecific distribution, Stable 11/30/2010 MRI BRAIN WO:  FINDINGS:  The pituitary gland is stable in appearance and appears prominent and with convex margin superiorly and measures 9 mm in craniocaudal dimension. There is no increased T2 or T1 signal within the mass on precontrast images. Lack of IV contrast and dynamic sequences limits evaluation for focal mass.  Multiple nonspecific foci and confluent areas of increased T2/FLAIR signal are present in the periventricular and subcortical white matter, stable from prior study.   There is no midline shift, intracranial hemorrhage, infarct, or extra-axial fluid collection.  IMPRESSION:  1. Stable slight prominence of the pituitary gland which may be normal for a female of this age. Lack of post contrast imaging limits definite evaluation for mass, but on the prior study, a discrete mass lesion was not evident. 2. Extensive foci and confluent areas of increased T2/FLAIR signal in the periventricular and subcortical white matter, stable from prior study and of indeterminate etiology. 09/09/2019 MRI BRAIN W WO:  FINDINGS:  Brain: No acute infarction, hemorrhage, hydrocephalus, extra-axial collection or mass lesion.  Scattered foci of T2 hyperintensity are seen within the white matter of the cerebral hemispheres predominantly deep and juxtacortical white matter. No posterior fossa lesion identified. None of the lesions show enhancement after the intravenous administration of contrast.  Pituitary/Sella: The pituitary gland is normal in appearance and size for age without mass lesion. Mildly heterogeneous enhancement in the dynamic phase with homogeneous enhancement on  the delayed is within normal limits. A normal posterior pituitary bright spot is seen. The infundibulum is midline. The hypothalamus and mamillary bodies are normal. There is no mass effect on  the optic chiasm or optic nerves. The infundibular and chiasmatic recesses are clear.  Normal cavernous sinus and cavernous internal carotid artery flow voids.  Vascular: Normal flow voids.  Skull and upper cervical spine: Normal marrow signal.  Sinuses/Orbits: Mild mucosal thickening throughout the paranasal sinuses. The orbits are maintained.  Other: Mildly prominent adenoid with retention cysts.  IMPRESSION:  1. Normal MRI of the pituitary gland with and without contrast.  2. Scattered foci of T2 hyperintensity predominantly within the deep and juxtacortical white matter of the cerebral hemispheres are nonspecific and more pronounced than expected for patient's age. Differential diagnosis is broad and include demyelinating disease, vasculitis, CADASIL in the setting of chronic migraine headache, and other inflammatory/post infectious processes.  PAST MEDICAL HISTORY: Past Medical History:  Diagnosis Date  . Anxiety   . Asthma   . Depression   . GERD (gastroesophageal reflux disease)   . Hallucinations 09/30/2014   Sizoaffective  . Hyperlipidemia   . Hypertension   . Intentional ingestion of batteries 04/21/2016    2 AAA batteries and 1 thumb tack; intent to hurt herself/notes 04/21/2016  . Tardive dyskinesia 10/2014   recent onset    PAST SURGICAL HISTORY: Past Surgical History:  Procedure Laterality Date  . ABDOMINAL SURGERY     "years ago" to remove foreign objects  . APPENDECTOMY    . BREAST EXCISIONAL BIOPSY Right 09/03/2007   surgical bx removed fibroadeoma  . BREAST LUMPECTOMY Right   . COLONOSCOPY WITH PROPOFOL N/A 09/10/2015   Procedure: COLONOSCOPY WITH PROPOFOL;  Surgeon: [name], MD;  Location: [facility] ENDOSCOPY;  Service: Endoscopy;  Laterality: N/A;  . ESOPHAGOGASTRODUODENOSCOPY  N/A 11/28/2014   Procedure: ESOPHAGOGASTRODUODENOSCOPY (EGD);  Surgeon: [name], MD;  Location: [facility] ENDOSCOPY;  Service: Endoscopy;  Laterality: N/A;  . ESOPHAGOGASTRODUODENOSCOPY N/A 02/21/2016   Procedure: ESOPHAGOGASTRODUODENOSCOPY (EGD);  Surgeon: Mauri Pole, MD;  Location: [facility] ENDOSCOPY;  Service: Endoscopy;  Laterality: N/A;  . ESOPHAGOGASTRODUODENOSCOPY N/A 04/21/2016   Procedure: ESOPHAGOGASTRODUODENOSCOPY (EGD);  Surgeon: Gatha Mayer, MD;  Location: [facility] ENDOSCOPY;  Service: Endoscopy;  Laterality: N/A;  . ESOPHAGOGASTRODUODENOSCOPY N/A 05/06/2016   Procedure: ESOPHAGOGASTRODUODENOSCOPY (EGD);  Surgeon: Jonathon Bellows, MD;  Location: [facility] ENDOSCOPY;  Service: Gastroenterology;  Laterality: N/A;  . ESOPHAGOGASTRODUODENOSCOPY N/A 06/13/2016   Procedure: ESOPHAGOGASTRODUODENOSCOPY (EGD);  Surgeon: [name], MD;  Location: [facility] ENDOSCOPY;  Service: Endoscopy;  Laterality: N/A;  . ESOPHAGOGASTRODUODENOSCOPY (EGD) WITH PROPOFOL N/A 02/29/2016   Procedure: ESOPHAGOGASTRODUODENOSCOPY (EGD) WITH PROPOFOL;  Surgeon: [name], MD;  Location: [facility] ENDOSCOPY;  Service: Endoscopy;  Laterality: N/A;  . ESOPHAGOGASTRODUODENOSCOPY (EGD) WITH PROPOFOL N/A 01/15/2018   Procedure: ESOPHAGOGASTRODUODENOSCOPY (EGD) WITH PROPOFOL;  Surgeon: Daneil Dolin, MD;  Location: [facility] ENDO SUITE;  Service: Endoscopy;  Laterality: N/A;  retrieval forein body  . LAPAROTOMY N/A 09/12/2015   Procedure: EXPLORATORY LAPAROTOMY;  Surgeon: Florene Glen, MD;  Location: [facility] ORS;  Service: General;  Laterality: N/A;  . SIGMOIDOSCOPY N/A 09/12/2015   Procedure: Lonell Face;  Surgeon: Florene Glen, MD;  Location: [facility] ORS;  Service: General;  Laterality: N/A;  . WISDOM TOOTH EXTRACTION      MEDICATIONS: Current Outpatient Medications on File Prior to Visit  Medication Sig Dispense Refill  . albuterol (PROAIR HFA) 108 (90 Base) MCG/ACT inhaler ProAir HFA 90 mcg/actuation aerosol inhaler  Inhale 2 puffs  every 4 hours by inhalation route as needed.    Marland Kitchen escitalopram (LEXAPRO) 5 MG tablet Take 5 mg by mouth every morning.    . folic acid (  FOLVITE) 1 MG tablet folic acid 1 mg tablet  Take 1 tablet every day by oral route.    . hydrOXYzine (VISTARIL) 50 MG capsule Take 50 mg by mouth 2 (two) times daily as needed.    Marland Kitchen levothyroxine (SYNTHROID) 50 MCG tablet Take 1 tablet (50 mcg total) by mouth daily. (Patient taking differently: Take 112 mcg by mouth daily. ) 90 tablet 3  . lithium carbonate 300 MG capsule Take 1 capsule (300 mg total) by mouth 2 (two) times daily with a meal. 60 capsule 1  . paliperidone (INVEGA) 6 MG 24 hr tablet Take 6 mg by mouth every morning.    . prazosin (MINIPRESS) 2 MG capsule Take 1 capsule (2 mg total) by mouth at bedtime. (Patient not taking: Reported on 08/21/2019) 30 capsule 0  . prazosin (MINIPRESS) 5 MG capsule Take 5 mg by mouth at bedtime.     No current facility-administered medications on file prior to visit.    ALLERGIES: Allergies  Allergen Reactions  . Betadine [Povidone Iodine] Other (See Comments)    Reaction:  Unknown   . Fish Allergy Hives  . Iodine Rash  . Shellfish-Derived Products Other (See Comments)    Reaction:  Unknown     FAMILY HISTORY: Family History  Problem Relation Age of Onset  . Depression Mother   . Hypertension Mother   . Sleep apnea Mother   . Asthma Mother   . COPD Mother   . Diabetes Mother   . Breast cancer Maternal Aunt        20's    SOCIAL HISTORY: Social History   Socioeconomic History  . Marital status: Single    Spouse name: Not on file  . Number of children: Not on file  . Years of education: Not on file  . Highest education level: Not on file  Occupational History  . Not on file  Tobacco Use  . Smoking status: Former Smoker    Packs/day: 0.50    Years: 3.00    Pack years: 1.50    Types: Cigarettes    Quit date: 08/2016    Years since quitting: 3.0  . Smokeless tobacco: Never Used    Substance and Sexual Activity  . Alcohol use: No  . Drug use: No  . Sexual activity: Yes    Birth control/protection: None  Other Topics Concern  . Not on file  Social History Narrative  . Not on file   Social Determinants of Health   Financial Resource Strain:   . Difficulty of Paying Living Expenses:   Food Insecurity:   . Worried About Charity fundraiser in the Last Year:   . Arboriculturist in the Last Year:   Transportation Needs:   . Film/video editor (Medical):   Marland Kitchen Lack of Transportation (Non-Medical):   Physical Activity:   . Days of Exercise per Week:   . Minutes of Exercise per Session:   Stress:   . Feeling of Stress :   Social Connections:   . Frequency of Communication with Friends and Family:   . Frequency of Social Gatherings with Friends and Family:   . Attends Religious Services:   . Active Member of Clubs or Organizations:   . Attends Archivist Meetings:   Marland Kitchen Marital Status:   Intimate Partner Violence:   . Fear of Current or Ex-Partner:   . Emotionally Abused:   Marland Kitchen Physically Abused:   . Sexually Abused:  PHYSICAL EXAM: Blood pressure 122/78, pulse 74, resp. rate 18, height 5\' 7"  (1.702 m), weight 257 lb (116.6 kg), SpO2 98 %. General: No acute distress.  Patient appears well-groomed.   Head:  Normocephalic/atraumatic Eyes:  fundi examined but not visualized Neck: supple, no paraspinal tenderness, full range of motion Back: No paraspinal tenderness Heart: regular rate and rhythm Lungs: Clear to auscultation bilaterally. Vascular: No carotid bruits. Neurological Exam: Mental status: alert and oriented to person, place, and time, recent and remote memory intact, fund of knowledge intact, attention and concentration intact, speech fluent and not dysarthric, language intact. Cranial nerves: CN I: not tested CN II: pupils equal, round and reactive to light, visual fields intact CN III, IV, VI:  full range of motion, no nystagmus,  no ptosis CN V: facial sensation intact CN VII: upper and lower face symmetric CN VIII: hearing intact CN IX, X: gag intact, uvula midline CN XI: sternocleidomastoid and trapezius muscles intact CN XII: tongue midline Bulk & Tone: normal, no fasciculations. Motor:  5/5 throughout  Sensation:  Pinprick and vibration sensation intact. Deep Tendon Reflexes:  2+ throughout, toes downgoing.  Finger to nose testing:  Without dysmetria.   Heel to shin:  Without dysmetria.   Gait:  Normal station and stride.  Able to turn and tandem walk. Romberg negative.  IMPRESSION: Abnormal white matter findings on brain MRI.    PLAN: 1.  Will request patient and guardian to obtain MRI brain images from 11/30/2010 and 11/11/2008 for personal review and compare to recent MRI.   2.  Will obtain neurology records from [facility] 3.  Check serum ANA, sed rate, CRP, SSA/SSB antibodies and ANCA. 4.  Further recommendations pending results.  Thank you for allowing me to take part in the care of this patient.  Metta Clines, DO  CC: Casilda Carls, MD

## 2019-09-19 ENCOUNTER — Other Ambulatory Visit: Payer: Self-pay

## 2019-09-19 ENCOUNTER — Encounter: Payer: Self-pay | Admitting: Neurology

## 2019-09-19 ENCOUNTER — Ambulatory Visit (INDEPENDENT_AMBULATORY_CARE_PROVIDER_SITE_OTHER): Payer: Medicare Other | Admitting: Neurology

## 2019-09-19 VITALS — BP 122/78 | HR 74 | Resp 18 | Ht 67.0 in | Wt 257.0 lb

## 2019-09-19 DIAGNOSIS — R9082 White matter disease, unspecified: Secondary | ICD-10-CM | POA: Diagnosis not present

## 2019-09-19 NOTE — Patient Instructions (Signed)
1.  I want to obtain notes from [facility] neurology 2.  I want to obtain MRI of brain from 11/11/2008 and 11/30/2010 to compare with recent MRI 3.  We will check ANA, sed rate, CRP, SSA/SSB antibodies, ANCA panel 4.  Further recommendations pending results.

## 2019-09-23 ENCOUNTER — Other Ambulatory Visit: Payer: Self-pay | Admitting: Neurology

## 2019-09-24 LAB — PAN-ANCA
ANCA Proteinase 3: <3.5 U/mL (ref 0.0–3.5)
Atypical pANCA: <1:20 {titer}
C-ANCA: <1:20 {titer}
Myeloperoxidase Ab: <9 U/mL (ref 0.0–9.0)
P-ANCA: <1:20 {titer}

## 2019-09-24 LAB — SJOGREN'S SYNDROME ANTIBODS(SSA + SSB)
ENA SSA (RO) Ab: >8 AI — ABNORMAL HIGH (ref 0.0–0.9)
ENA SSB (LA) Ab: <0.2 AI (ref 0.0–0.9)

## 2019-09-24 LAB — C-REACTIVE PROTEIN: CRP: 9 mg/L (ref 0–10)

## 2019-09-24 LAB — ENA+DNA/DS+SJORGEN'S
ENA RNP Ab: <0.2 AI (ref 0.0–0.9)
ENA SM Ab Ser-aCnc: <0.2 AI (ref 0.0–0.9)
dsDNA Ab: 1 IU/mL (ref 0–9)

## 2019-09-24 LAB — SEDIMENTATION RATE: Sed Rate: 37 mm/hr — ABNORMAL HIGH (ref 0–32)

## 2019-09-24 LAB — ANA W/REFLEX: Anti Nuclear Antibody (ANA): POSITIVE — AB

## 2019-10-01 ENCOUNTER — Telehealth: Payer: Self-pay | Admitting: Neurology

## 2019-10-01 NOTE — Telephone Encounter (Signed)
I called and spoke with Melanie Bell regarding her lab results.  Positive ANA with positive SSA antibodies and elevated sed rate of 37.  She does have family history of lupus.  I will refer her to rheumatology at the [facility] as she lives in [place].  We also discussed possibility of repeating a lumbar puncture.  I will have my staff contact her to make a follow up appointment.

## 2019-10-02 ENCOUNTER — Other Ambulatory Visit: Payer: Self-pay

## 2019-10-02 DIAGNOSIS — R9082 White matter disease, unspecified: Secondary | ICD-10-CM

## 2019-10-02 NOTE — Progress Notes (Signed)
Rheumatology referral added to [facility] in [place] Per DR. Tomi Likens

## 2019-10-16 ENCOUNTER — Telehealth: Payer: Self-pay | Admitting: Neurology

## 2019-10-16 NOTE — Telephone Encounter (Signed)
Patient's doctor at [facility] called in and wanted the patient's MRI Results faxed over to [phone]

## 2019-10-16 NOTE — Telephone Encounter (Signed)
Telephone call to pt, Asking if we could fax over MRI to [facility]. They are not listed to know or obtained records.   Pt states that will be ok, They are the company who transports the pt back and forth to her appt.     Advised pt that she needs to add tem to her list to be able to obtain her medical records. Will fax today only.

## 2019-10-28 DIAGNOSIS — M25562 Pain in left knee: Secondary | ICD-10-CM | POA: Insufficient documentation

## 2019-10-28 DIAGNOSIS — R768 Other specified abnormal immunological findings in serum: Secondary | ICD-10-CM | POA: Insufficient documentation

## 2019-10-28 DIAGNOSIS — G8929 Other chronic pain: Secondary | ICD-10-CM | POA: Insufficient documentation

## 2019-10-31 ENCOUNTER — Ambulatory Visit: Payer: Medicare Other | Admitting: Internal Medicine

## 2019-12-09 DIAGNOSIS — R768 Other specified abnormal immunological findings in serum: Secondary | ICD-10-CM | POA: Insufficient documentation

## 2019-12-20 ENCOUNTER — Other Ambulatory Visit: Payer: Self-pay

## 2019-12-20 ENCOUNTER — Ambulatory Visit (INDEPENDENT_AMBULATORY_CARE_PROVIDER_SITE_OTHER): Payer: Medicare Other | Admitting: Internal Medicine

## 2019-12-20 ENCOUNTER — Encounter: Payer: Self-pay | Admitting: Internal Medicine

## 2019-12-20 VITALS — BP 124/78 | HR 79 | Ht 67.0 in | Wt 276.6 lb

## 2019-12-20 DIAGNOSIS — E221 Hyperprolactinemia: Secondary | ICD-10-CM | POA: Diagnosis not present

## 2019-12-20 DIAGNOSIS — E039 Hypothyroidism, unspecified: Secondary | ICD-10-CM

## 2019-12-20 DIAGNOSIS — E038 Other specified hypothyroidism: Secondary | ICD-10-CM

## 2019-12-20 LAB — PROLACTIN: Prolactin: 18.7 ng/mL

## 2019-12-20 LAB — TSH: TSH: 2.26 u[IU]/mL (ref 0.35–4.50)

## 2019-12-20 NOTE — Progress Notes (Signed)
[name]  MRN/ DOB: [id number], 12-01-1990    Age/ Sex: 29 y.o., female     PCP: Casilda Carls, MD   Reason for Endocrinology Evaluation: Hyperprolactinemia      Initial Endocrinology Clinic Visit: 08/02/2019    PATIENT IDENTIFIER: Melanie Bell is a 29 y.o., female with a past medical history of Bipolar disorder and anxiety. She has followed with [facility] since 08/02/2019 for consultative assistance with management of her hyperprolactinemia .   HISTORICAL SUMMARY:  Pt was noted to have an elevated prolactin level at 66.3 ng/mL, during evaluation of secondary amenorrhea and galactorrhea in 06/2019.   She does endorse history of elevated prolactin since her teenage years.  She has been diagnosed with bipolar disorder, PTSD and anxiety and has been on multiple antipsychotic medications over the years.  She has been on ox carbamazepine as well as risperidone in the past.   Prolactin on initial presentation to our clinic was 40.2 ng/mL, with normal LH, FSH, Testosterone and TSH    An MRI was obtained 09/09/2019 which demonstrated normal pituitary but abnormal white matter.  Pt was advised to discuss switching Invega with psych as it has been associated with elevated prolactin .      She was seen by Dr. Tomi Likens ( Neurology for abnormal white matter)   Sister  And mother with hypothyroidism and  Sister with hyperthyroidism   HYPOTHYROID HISTORY:  Patient reports being on LT-4 replacement therapy since her teenage years, she had ran out of her medication by 06/2019 and was advised not to restart since her TFTs were normal. Repeat TSH elevated in 07/2019 was elevated at 4.77 uIU/mL. And LT-4 replacement restarted.   SUBJECTIVE:     Today (12/20/2019):  Melanie Bell is here for hyperprolactinemia and hypothyroidism.   She states she discussed     Pt has been noted with weight gain    LMP 8/3/ 2021 has been regular  Galactorrhea  resolved a while ago   Medications  Levothyroxine 50 mcg daily     ROS:  As per HPI.   HISTORY:  Past Medical History:  Past Medical History:  Diagnosis Date  . Anxiety   . Asthma   . Depression   . GERD (gastroesophageal reflux disease)   . Hallucinations 09/30/2014   Sizoaffective  . Hyperlipidemia   . Hypertension   . Intentional ingestion of batteries 04/21/2016    2 AAA batteries and 1 thumb tack; intent to hurt herself/notes 04/21/2016  . Tardive dyskinesia 10/2014   recent onset   Past Surgical History:  Past Surgical History:  Procedure Laterality Date  . ABDOMINAL SURGERY     "years ago" to remove foreign objects  . APPENDECTOMY    . BREAST EXCISIONAL BIOPSY Right 09/03/2007   surgical bx removed fibroadeoma  . BREAST LUMPECTOMY Right   . COLONOSCOPY WITH PROPOFOL N/A 09/10/2015   Procedure: COLONOSCOPY WITH PROPOFOL;  Surgeon: [name], MD;  Location: [facility] ENDOSCOPY;  Service: Endoscopy;  Laterality: N/A;  . ESOPHAGOGASTRODUODENOSCOPY N/A 11/28/2014   Procedure: ESOPHAGOGASTRODUODENOSCOPY (EGD);  Surgeon: [name], MD;  Location: [facility] ENDOSCOPY;  Service: Endoscopy;  Laterality: N/A;  . ESOPHAGOGASTRODUODENOSCOPY N/A 02/21/2016   Procedure: ESOPHAGOGASTRODUODENOSCOPY (EGD);  Surgeon: Mauri Pole, MD;  Location: [facility] ENDOSCOPY;  Service: Endoscopy;  Laterality: N/A;  . ESOPHAGOGASTRODUODENOSCOPY N/A 04/21/2016   Procedure: ESOPHAGOGASTRODUODENOSCOPY (EGD);  Surgeon: Gatha Mayer, MD;  Location: [facility] ENDOSCOPY;  Service: Endoscopy;  Laterality: N/A;  .  ESOPHAGOGASTRODUODENOSCOPY N/A 05/06/2016   Procedure: ESOPHAGOGASTRODUODENOSCOPY (EGD);  Surgeon: Jonathon Bellows, MD;  Location: [facility] ENDOSCOPY;  Service: Gastroenterology;  Laterality: N/A;  . ESOPHAGOGASTRODUODENOSCOPY N/A 06/13/2016   Procedure: ESOPHAGOGASTRODUODENOSCOPY (EGD);  Surgeon: [name], MD;  Location: [facility] ENDOSCOPY;  Service: Endoscopy;  Laterality: N/A;  .  ESOPHAGOGASTRODUODENOSCOPY (EGD) WITH PROPOFOL N/A 02/29/2016   Procedure: ESOPHAGOGASTRODUODENOSCOPY (EGD) WITH PROPOFOL;  Surgeon: [name], MD;  Location: [facility] ENDOSCOPY;  Service: Endoscopy;  Laterality: N/A;  . ESOPHAGOGASTRODUODENOSCOPY (EGD) WITH PROPOFOL N/A 01/15/2018   Procedure: ESOPHAGOGASTRODUODENOSCOPY (EGD) WITH PROPOFOL;  Surgeon: Daneil Dolin, MD;  Location: [facility] ENDO SUITE;  Service: Endoscopy;  Laterality: N/A;  retrieval forein body  . LAPAROTOMY N/A 09/12/2015   Procedure: EXPLORATORY LAPAROTOMY;  Surgeon: Florene Glen, MD;  Location: [facility] ORS;  Service: General;  Laterality: N/A;  . SIGMOIDOSCOPY N/A 09/12/2015   Procedure: Lonell Face;  Surgeon: Florene Glen, MD;  Location: [facility] ORS;  Service: General;  Laterality: N/A;  . WISDOM TOOTH EXTRACTION      Social History:  reports that she quit smoking about 3 years ago. Her smoking use included cigarettes. She has a 1.50 pack-year smoking history. She has never used smokeless tobacco. She reports that she does not drink alcohol and does not use drugs. Family History:  Family History  Problem Relation Age of Onset  . Depression Mother   . Hypertension Mother   . Sleep apnea Mother   . Asthma Mother   . COPD Mother   . Diabetes Mother   . Breast cancer Maternal Aunt        20's     HOME MEDICATIONS: Allergies as of 12/20/2019      Reactions   Betadine [povidone Iodine] Other (See Comments)   Reaction:  Unknown    Fish Allergy Hives   Iodine Rash   Shellfish-derived Products Other (See Comments)   Reaction:  Unknown       Medication List       Accurate as of December 20, 2019 11:59 PM. If you have any questions, ask your nurse or doctor.        escitalopram 5 MG tablet Commonly known as: LEXAPRO Take 5 mg by mouth every morning.   folic acid 1 MG tablet Commonly known as: FOLVITE folic acid 1 mg tablet  Take 1 tablet every day by oral route.   hydrOXYzine 50 MG capsule Commonly known as:  VISTARIL Take 50 mg by mouth 2 (two) times daily as needed.   levothyroxine 50 MCG tablet Commonly known as: SYNTHROID Take 1 tablet (50 mcg total) by mouth daily. What changed: how much to take   lithium carbonate 300 MG capsule Take 1 capsule (300 mg total) by mouth 2 (two) times daily with a meal.   paliperidone 6 MG 24 hr tablet Commonly known as: INVEGA Take 6 mg by mouth every morning.   prazosin 2 MG capsule Commonly known as: MINIPRESS Take 1 capsule (2 mg total) by mouth at bedtime. What changed: Another medication with the same name was removed. Continue taking this medication, and follow the directions you see here. Changed by: Dorita Sciara, MD   ProAir HFA 108 [phone] Base) MCG/ACT inhaler Generic drug: albuterol ProAir HFA 90 mcg/actuation aerosol inhaler  Inhale 2 puffs every 4 hours by inhalation route as needed.         OBJECTIVE:   PHYSICAL EXAM: VS: BP 124/78   Pulse 79   Ht 5\' 7"  (1.702 m)   Wt  276 lb 9.6 oz (125.5 kg)   SpO2 98%   BMI 43.32 kg/m    EXAM: General: Pt appears well and is in NAD  Neck: General: Supple without adenopathy. Thyroid: Thyroid size normal.  No goiter or nodules appreciated. No thyroid bruit.  Lungs: Clear with good BS bilat with no rales, rhonchi, or wheezes  Heart: Auscultation: RRR.  Abdomen: Normoactive bowel sounds, soft, nontender, without masses or organomegaly palpable  Extremities:  BL LE: No pretibial edema normal ROM and strength.  Mental Status: Judgment, insight: Intact Mood and affect: No depression, anxiety, or agitation     DATA REVIEWED: Results for YURY, SCHAUS (MRN [id number]) as of 12/21/2019 07:56  Ref. Range 12/20/2019 11:06  Prolactin Latest Units: ng/mL 18.7  TSH Latest Ref Range: 0.35 - 4.50 uIU/mL 2.26   MRI brain 09/09/2019 Scattered foci of T2 hyperintensity are seen within the white matter of the cerebral hemispheres predominantly deep and juxtacortical white matter. No  posterior fossa lesion identified. None of the lesions show enhancement after the intravenous administration of contrast.  Pituitary/Sella: The pituitary gland is normal in appearance and size for age without mass lesion. Mildly heterogeneous enhancement in the dynamic phase with homogeneous enhancement on the delayed is within normal limits. A normal posterior pituitary bright spot is seen. The infundibulum is midline. The hypothalamus and mamillary bodies are normal. There is no mass effect on the optic chiasm or optic nerves. The infundibular and chiasmatic recesses are clear. Normal cavernous sinus and cavernous internal carotid artery flow voids.  ASSESSMENT / PLAN / RECOMMENDATIONS:   1. Hyperprolactinemia:    -Patient with chronic long-term history of intermittent hyperprolactinemia.This is attributed to multiple antipsychotic medications that have she has been on since her teenage years, that are known to cause hyperprolactinemia such as risperidone and paliperidone Lorayne Bender ) -Repeat prolactin level has normalized.  - Menstruation has been regular and galactorrhea has resolved.  - MRI did NOT show any evidence of pituitary adenoma   2.   Hypothyroidism  - Pt is clinically and biochemically euthyroid  - No local neck symptoms      Medication Continue Levothyroxine 50 MCG daily     F/U in 4 months     Signed electronically by: Mack Guise, MD  [facility]  [address] [place], [place] [address] Phone: [phone] FAX: [phone]      CC: Casilda Carls, [place] [address] Phone: [phone]  Fax: [phone]   Return to Endocrinology clinic as below: Future Appointments  Date Time Provider [place]  02/12/2020 10:30 AM Pieter Partridge, DO [facility] None  04/24/2020 10:50 AM Makalya Nave, Melanie Crazier, MD [facility] None  08/24/2020 10:00 AM  Philip Aspen, CNM [facility] None

## 2019-12-20 NOTE — Patient Instructions (Signed)

## 2019-12-21 MED ORDER — LEVOTHYROXINE SODIUM 50 MCG PO TABS: 50.0000 ug | ORAL_TABLET | Freq: Every day | ORAL | 3 refills | Status: DC

## 2020-02-11 NOTE — Progress Notes (Signed)
NEUROLOGY FOLLOW UP OFFICE NOTE  Melanie Bell [id number]  HISTORY OF PRESENT ILLNESS: Melanie Bell. Melanie Bell is a 29 year old right-handed white female with Bipolar disorder, PTSD and depression, tardive dyskinesia, hyperprolactinemia, hypertension and hyperlipidemia who follows up for abnormal white matter changes on brain MRI.  UPDATE: Blood work from May included positive ANA with positive SSA antibody of over 8 but negative SSB, ds-DNA, Scl-70 and Jo-1.  Other labs included mildly elevated sed rate of 37, CRP 9 and negative pan-ANCA panel.  She was referred to rheumatology who determined that she has no signs or symptoms of Sjogren's (or RA) at this point.  She is taking Mobic for knees, which is helpful.  Migraines are infrequent.  HISTORY: She has diagnosis of Bipolar disorder, PTSD and depression with longstanding history of multiple antipsychotic and neuroleptic medications.  She was diagnosed with hyperprolactinemia in February after experiencing galactorrhea and amenorrhea.  She underwent MRI of brain/pitutiary with and without contrast on 09/09/2019 which was negative for pituitary adenoma but demonstrated scattered non-enhancing T2 hyperintense foci in the deep and juxtacortical white matter of the cerebral hemispheres.  She presents for further evaluation.  She was found to have abnormal white matter changes on brain MRI at age 34.  She was checked following brain injury.  She underwent MS workup.  MRI of cervical spine was negative for lesions.  She underwent lumbar puncture which was reportedly unremarkable.  Over the next few years, between 2008 and 2012, she had routine follow up brain MRIs which were reportedly stable.  She never had prior neurologic events such as ataxia, numbness, weakness or vision loss.  She sees the eye doctor routinely.  She has history of headaches, consistent with migraines (severe, nausea, photophobia, phonophobia).    Family history is notable  for lupus with 2 maternal aunts and a maternal aunt.  Her mother may have lupus.  No family history of MS.  Imaging: 02/22/2007 MRI BRAIN W WO:  FINDINGS: There are scattered foci of predominantly subcortical T2/FLAIR signal abnormality, more confluent posteriorly in the parieto-occipital regions. No enhancing lesions are identified.  There is no mass effect or midline shift. There is no evidence of intracranial hemorrhage or acute infarct.  IMPRESSION:   Nonspecific white matter disease, predominantly subcortical and more confluent posteriorly. This is a nonspecific finding and may reflect demyelinating disease, an infectious/inflammatory process, or less likely vasculitis. 03/15/2007 MRI BRAIN W WO:  FINDINGS: There are scattered foci of predominantly subcortical T2/FLAIR signal abnormality, more confluent posteriorly in the parieto-occipital regions which are not significantly changed. No enhancing lesions are identified.  An 8 mm defect in the anterior midline frontal subcutaneous fat is again seen near the vertex and of uncertain etiology.  There is no evidence of intracranial hemorrhage, acute infarct, mass lesion, mass effect, or extra-axial fluid collection.  IMPRESSION: 1. Stable nonspecific white matter disease, predominantly subcortical and periventricular, and more confluent posteriorly. This is a nonspecific finding and may reflect demyelinating disease. However , lupus as well as vasculitis are also within the differential.  2. Stable defect in the anterior midline frontal subcutaneous fat near the vertex. Direct visualization is recommended 03/15/2007 MRI CERVICAL & THORACIC W WO:  FINDINGS:  The study is significantly degraded by patient motion artifact.  CERVICAL: The vertebral bodies demonstrate normal height, signal and alignment. The intervertebral discs are normal. The cervical cord demonstrates patchy areas of increased T2 signal within the posterior aspect of the cord at the  level of  C2 and C3. A portion of this may be artifactual due to patient motion on this exam. No central canal or neural foraminal stenosis. No abnormal enhancement is identified.  THORACIC: The vertebral bodies demonstrate normal height, signal and alignment. The intervertebral discs are normal. The thoracic cord is normal in appearance. No central canal or neural foraminal stenosis. No abnormal enhancement is Identified.  IMPRESSION: Limited exam due to patient motion with abnormal increased T2 signal in the cervical spinal cord without abnormal enhancement. 10/31/2007 MRI BRAIN W WO:  FINDINGS: There are multiple white matter foci of abnormal increased T2/FLAIR signal. There are greater than nine lesions, predominately involving the subcortical white matter, more confluent posteriorly in the parietal occipital regions. There has been no significant changed in distribution since the prior study. No enhancing lesions are identified. There are no infratentorial lesions. The optic nerves are normal in appearance.  There is no evidence of intracranial hemorrhage, acute infarct, mass effect, or extra-axial fluid collection. The subcutaneous lesion in the midline frontal scalp is unchanged.  IMPRESSION:  No significant interval change in nonspecific white matter disease. 10/31/2019 MRI CERVICAL W WO:  FINDINGS: The vertebral bodies are normally aligned.  The signal intensity from the vertebral body bone marrow and spinal cord is normal. There is no significant spinal canal or neuroforaminal stenosis. No definite abnormal signal is identified in the cervical spine. No enhancing lesions are identified.  IMPRESSION: No definite cervical spine signal abnormality or enhancing lesion. 11/11/2008 MRI BRAIN W WO:  FINDINGS: There are multiple white matter foci of abnormal increased T2/FLAIR signal. The distribution of the foci of FLAIR signal abnormalities is predominantly within the white matter tracts and  there are no lesions identified perpendicular to the lateral ventricles. There are several subcortical lesions. There are no infratentorial lesions. No enhancing lesions are seen.  The optic nerves are normal in appearance.  There is no evidence of intracranial hemorrhage, acute infarct, mass effect, or extra-axial fluid collection.  The previously noted subcutaneous lesion overlying the frontal scalp is unchanged.  IMPRESSION:  Multiple white matter lesions in a nonspecific distribution, Stable 11/30/2010 MRI BRAIN WO:  FINDINGS:  The pituitary gland is stable in appearance and appears prominent and with convex margin superiorly and measures 9 mm in craniocaudal dimension. There is no increased T2 or T1 signal within the mass on precontrast images. Lack of IV contrast and dynamic sequences limits evaluation for focal mass.  Multiple nonspecific foci and confluent areas of increased T2/FLAIR signal are present in the periventricular and subcortical white matter, stable from prior study.   There is no midline shift, intracranial hemorrhage, infarct, or extra-axial fluid collection.  IMPRESSION:  1. Stable slight prominence of the pituitary gland which may be normal for a female of this age. Lack of post contrast imaging limits definite evaluation for mass, but on the prior study, a discrete mass lesion was not evident. 2. Extensive foci and confluent areas of increased T2/FLAIR signal in the periventricular and subcortical white matter, stable from prior study and of indeterminate etiology. 09/09/2019 MRI BRAIN W WO:  FINDINGS:  Brain: No acute infarction, hemorrhage, hydrocephalus, extra-axial collection or mass lesion.  Scattered foci of T2 hyperintensity are seen within the white matter of the cerebral hemispheres predominantly deep and juxtacortical white matter. No posterior fossa lesion identified. None of the lesions show enhancement after the intravenous administration of contrast.   Pituitary/Sella: The pituitary gland is normal in appearance and size for age without mass lesion. Mildly heterogeneous  enhancement in the dynamic phase with homogeneous enhancement on the delayed is within normal limits. A normal posterior pituitary bright spot is seen. The infundibulum is midline. The hypothalamus and mamillary bodies are normal. There is no mass effect on the optic chiasm or optic nerves. The infundibular and chiasmatic recesses are clear.  Normal cavernous sinus and cavernous internal carotid artery flow voids.  Vascular: Normal flow voids.  Skull and upper cervical spine: Normal marrow signal.  Sinuses/Orbits: Mild mucosal thickening throughout the paranasal sinuses. The orbits are maintained.  Other: Mildly prominent adenoid with retention cysts.  IMPRESSION:  1. Normal MRI of the pituitary gland with and without contrast.  2. Scattered foci of T2 hyperintensity predominantly within the deep and juxtacortical white matter of the cerebral hemispheres are nonspecific and more pronounced than expected for patient's age. Differential diagnosis is broad and include demyelinating disease, vasculitis, CADASIL in the setting of chronic migraine headache, and other inflammatory/post infectious processes.  PAST MEDICAL HISTORY: Past Medical History:  Diagnosis Date  . Anxiety   . Asthma   . Depression   . GERD (gastroesophageal reflux disease)   . Hallucinations 09/30/2014   Sizoaffective  . Hyperlipidemia   . Hypertension   . Intentional ingestion of batteries 04/21/2016    2 AAA batteries and 1 thumb tack; intent to hurt herself/notes 04/21/2016  . Tardive dyskinesia 10/2014   recent onset    MEDICATIONS: Current Outpatient Medications on File Prior to Visit  Medication Sig Dispense Refill  . albuterol (PROAIR HFA) 108 (90 Base) MCG/ACT inhaler ProAir HFA 90 mcg/actuation aerosol inhaler  Inhale 2 puffs every 4 hours by inhalation route as needed.    Marland Kitchen escitalopram (LEXAPRO) 5 MG  tablet Take 5 mg by mouth every morning.    . folic acid (FOLVITE) 1 MG tablet folic acid 1 mg tablet  Take 1 tablet every day by oral route.    . hydrOXYzine (VISTARIL) 50 MG capsule Take 50 mg by mouth 2 (two) times daily as needed.    Marland Kitchen levothyroxine (SYNTHROID) 50 MCG tablet Take 1 tablet (50 mcg total) by mouth daily. 90 tablet 3  . lithium carbonate 300 MG capsule Take 1 capsule (300 mg total) by mouth 2 (two) times daily with a meal. 60 capsule 1  . paliperidone (INVEGA) 6 MG 24 hr tablet Take 6 mg by mouth every morning.    . prazosin (MINIPRESS) 2 MG capsule Take 1 capsule (2 mg total) by mouth at bedtime. 30 capsule 0   No current facility-administered medications on file prior to visit.    ALLERGIES: Allergies  Allergen Reactions  . Betadine [Povidone Iodine] Other (See Comments)    Reaction:  Unknown   . Fish Allergy Hives  . Iodine Rash  . Shellfish-Derived Products Other (See Comments)    Reaction:  Unknown     FAMILY HISTORY: Family History  Problem Relation Age of Onset  . Depression Mother   . Hypertension Mother   . Sleep apnea Mother   . Asthma Mother   . COPD Mother   . Diabetes Mother   . Breast cancer Maternal Aunt        20's    SOCIAL HISTORY: Social History   Socioeconomic History  . Marital status: Significant Other    Spouse name: Not on file  . Number of children: 1  . Years of education: 42  . Highest education level: Not on file  Occupational History  . Occupation: unemployed  Tobacco Use  .  Smoking status: Former Smoker    Packs/day: 0.50    Years: 3.00    Pack years: 1.50    Types: Cigarettes    Quit date: 08/2016    Years since quitting: 3.4  . Smokeless tobacco: Never Used  Substance and Sexual Activity  . Alcohol use: No  . Drug use: No  . Sexual activity: Yes    Birth control/protection: None  Other Topics Concern  . Not on file  Social History Narrative   Right handed    One story home   Drinks occasional caffeine     Social Determinants of Health   Financial Resource Strain:   . Difficulty of Paying Living Expenses: Not on file  Food Insecurity:   . Worried About Charity fundraiser in the Last Year: Not on file  . Ran Out of Food in the Last Year: Not on file  Transportation Needs:   . Lack of Transportation (Medical): Not on file  . Lack of Transportation (Non-Medical): Not on file  Physical Activity:   . Days of Exercise per Week: Not on file  . Minutes of Exercise per Session: Not on file  Stress:   . Feeling of Stress : Not on file  Social Connections:   . Frequency of Communication with Friends and Family: Not on file  . Frequency of Social Gatherings with Friends and Family: Not on file  . Attends Religious Services: Not on file  . Active Member of Clubs or Organizations: Not on file  . Attends Archivist Meetings: Not on file  . Marital Status: Not on file  Intimate Partner Violence:   . Fear of Current or Ex-Partner: Not on file  . Emotionally Abused: Not on file  . Physically Abused: Not on file  . Sexually Abused: Not on file    PHYSICAL EXAM: Blood pressure 135/78, pulse 89, height 5\' 8"  (1.727 m), weight 275 lb 9.6 oz (125 kg), SpO2 98 %. General: No acute distress.  Patient appears well-groomed.   Head:  Normocephalic/atraumatic Eyes:  Fundi examined but not visualized Neck: supple, no paraspinal tenderness, full range of motion Heart:  Regular rate and rhythm Lungs:  Clear to auscultation bilaterally Back: No paraspinal tenderness Neurological Exam: alert and oriented to person, place, and time. Attention span and concentration intact, recent and remote memory intact, fund of knowledge intact.  Speech fluent and not dysarthric, language intact.  CN II-XII intact. Bulk and tone normal, muscle strength 5/5 throughout.  Sensation to temperature and vibration intact.  Deep tendon reflexes 2+ throughout, toes downgoing.  Finger to nose and heel to shin testing intact.   Gait normal, Romberg negative.  IMPRESSION: Abnormal white matter on MRI of brain.  At this time, no clear diagnosis.    PLAN: I would probably repeat MRI of brain in one year.  However, she is moving near [place] and would like to establish care with a new neurologist there.  I will refer her to the neurology practice at the [facility].  Metta Clines, DO  CC: Casilda Carls, MD

## 2020-02-12 ENCOUNTER — Other Ambulatory Visit: Payer: Self-pay

## 2020-02-12 ENCOUNTER — Encounter: Payer: Self-pay | Admitting: Neurology

## 2020-02-12 ENCOUNTER — Ambulatory Visit (INDEPENDENT_AMBULATORY_CARE_PROVIDER_SITE_OTHER): Payer: Medicare Other | Admitting: Neurology

## 2020-02-12 VITALS — BP 135/78 | HR 89 | Ht 68.0 in | Wt 275.6 lb

## 2020-02-12 DIAGNOSIS — R9082 White matter disease, unspecified: Secondary | ICD-10-CM

## 2020-02-12 NOTE — Patient Instructions (Signed)
I will refer you to the neurology practice at the [facility] in [place].

## 2020-04-24 ENCOUNTER — Encounter: Payer: Self-pay | Admitting: Internal Medicine

## 2020-04-24 ENCOUNTER — Ambulatory Visit (INDEPENDENT_AMBULATORY_CARE_PROVIDER_SITE_OTHER): Payer: Medicare Other | Admitting: Internal Medicine

## 2020-04-24 ENCOUNTER — Other Ambulatory Visit: Payer: Self-pay

## 2020-04-24 VITALS — BP 122/78 | HR 72 | Ht 68.0 in | Wt 285.1 lb

## 2020-04-24 DIAGNOSIS — Z8639 Personal history of other endocrine, nutritional and metabolic disease: Secondary | ICD-10-CM

## 2020-04-24 DIAGNOSIS — E039 Hypothyroidism, unspecified: Secondary | ICD-10-CM

## 2020-04-24 LAB — BASIC METABOLIC PANEL
BUN: 17 mg/dL (ref 6–23)
CO2: 27 mEq/L (ref 19–32)
Calcium: 10.1 mg/dL (ref 8.4–10.5)
Chloride: 104 mEq/L (ref 96–112)
Creatinine, Ser: 0.71 mg/dL (ref 0.40–1.20)
GFR: 113.72 mL/min (ref >60.00)
Glucose, Bld: 84 mg/dL (ref 70–99)
Potassium: 4 mEq/L (ref 3.5–5.1)
Sodium: 139 mEq/L (ref 135–145)

## 2020-04-24 LAB — TSH: TSH: 1.48 u[IU]/mL (ref 0.35–4.50)

## 2020-04-24 NOTE — Progress Notes (Signed)
[name]  MRN/ DOB: [id number], 03/23/1991    Age/ Sex: 29 y.o., female     PCP: Casilda Carls, MD   Reason for Endocrinology Evaluation: Hyperprolactinemia      Initial Endocrinology Clinic Visit: 08/02/2019    PATIENT IDENTIFIER: Melanie Bell is a 29 y.o., female with a past medical history of Bipolar disorder and anxiety. She has followed with [facility] since 08/02/2019 for consultative assistance with management of her hyperprolactinemia .   HISTORICAL SUMMARY:  Pt was noted to have an elevated prolactin level at 66.3 ng/mL, during evaluation of secondary amenorrhea and galactorrhea in 06/2019.   She does endorse history of elevated prolactin since her teenage years.  She has been diagnosed with bipolar disorder, PTSD and anxiety and has been on multiple antipsychotic medications over the years.  She has been on ox carbamazepine as well as risperidone in the past.   Prolactin on initial presentation to our clinic was 40.2 ng/mL, with normal LH, FSH, Testosterone and TSH    An MRI was obtained 09/09/2019 which demonstrated normal pituitary but abnormal white matter.  Pt was advised to discuss switching Invega with psych as it has been associated with elevated prolactin .    She was seen by Dr. Tomi Likens ( Neurology for abnormal white matter)   Sister  And mother with hypothyroidism and  Sister with hyperthyroidism      HYPOTHYROID HISTORY:  Patient reports being on LT-4 replacement therapy since her teenage years, she had ran out of her medication by 06/2019 and was advised not to restart since her TFTs were normal. Repeat TSH elevated in 07/2019 was elevated at 4.77 uIU/mL. And LT-4 replacement restarted.   SUBJECTIVE:     Today (12/20/2019):  Melanie Bell is here for hyperprolactinemia and hypothyroidism.   Pt has been noted with weight gain  Denies constipation  Has occasional headaches but no vision changes    LMP  November  has been regular  Denies Galactorrhea   Medications Levothyroxine 50 mcg daily   She endorses hypoglycemia with the lowest BG  48 mg/dL ~ a couple months ago. Since then it has not been less 60 mg/dL. She uses juice or glucose tablets.    No  hx of gastric sx     HISTORY:  Past Medical History:  Past Medical History:  Diagnosis Date  . Anxiety   . Asthma   . Depression   . GERD (gastroesophageal reflux disease)   . Hallucinations 09/30/2014   Sizoaffective  . Hyperlipidemia   . Hypertension   . Intentional ingestion of batteries 04/21/2016    2 AAA batteries and 1 thumb tack; intent to hurt herself/notes 04/21/2016  . Tardive dyskinesia 10/2014   recent onset   Past Surgical History:  Past Surgical History:  Procedure Laterality Date  . ABDOMINAL SURGERY     "years ago" to remove foreign objects  . APPENDECTOMY    . BREAST EXCISIONAL BIOPSY Right 09/03/2007   surgical bx removed fibroadeoma  . BREAST LUMPECTOMY Right   . COLONOSCOPY WITH PROPOFOL N/A 09/10/2015   Procedure: COLONOSCOPY WITH PROPOFOL;  Surgeon: [name], MD;  Location: [facility] ENDOSCOPY;  Service: Endoscopy;  Laterality: N/A;  . ESOPHAGOGASTRODUODENOSCOPY N/A 11/28/2014   Procedure: ESOPHAGOGASTRODUODENOSCOPY (EGD);  Surgeon: [name], MD;  Location: [facility] ENDOSCOPY;  Service: Endoscopy;  Laterality: N/A;  . ESOPHAGOGASTRODUODENOSCOPY N/A 02/21/2016   Procedure: ESOPHAGOGASTRODUODENOSCOPY (EGD);  Surgeon: Mauri Pole, MD;  Location: [facility] ENDOSCOPY;  Service: Endoscopy;  Laterality: N/A;  . ESOPHAGOGASTRODUODENOSCOPY N/A 04/21/2016   Procedure: ESOPHAGOGASTRODUODENOSCOPY (EGD);  Surgeon: Gatha Mayer, MD;  Location: [facility] ENDOSCOPY;  Service: Endoscopy;  Laterality: N/A;  . ESOPHAGOGASTRODUODENOSCOPY N/A 05/06/2016   Procedure: ESOPHAGOGASTRODUODENOSCOPY (EGD);  Surgeon: Jonathon Bellows, MD;  Location: [facility] ENDOSCOPY;  Service: Gastroenterology;  Laterality: N/A;  .  ESOPHAGOGASTRODUODENOSCOPY N/A 06/13/2016   Procedure: ESOPHAGOGASTRODUODENOSCOPY (EGD);  Surgeon: [name], MD;  Location: [facility] ENDOSCOPY;  Service: Endoscopy;  Laterality: N/A;  . ESOPHAGOGASTRODUODENOSCOPY (EGD) WITH PROPOFOL N/A 02/29/2016   Procedure: ESOPHAGOGASTRODUODENOSCOPY (EGD) WITH PROPOFOL;  Surgeon: [name], MD;  Location: [facility] ENDOSCOPY;  Service: Endoscopy;  Laterality: N/A;  . ESOPHAGOGASTRODUODENOSCOPY (EGD) WITH PROPOFOL N/A 01/15/2018   Procedure: ESOPHAGOGASTRODUODENOSCOPY (EGD) WITH PROPOFOL;  Surgeon: Daneil Dolin, MD;  Location: [facility] ENDO SUITE;  Service: Endoscopy;  Laterality: N/A;  retrieval forein body  . LAPAROTOMY N/A 09/12/2015   Procedure: EXPLORATORY LAPAROTOMY;  Surgeon: Florene Glen, MD;  Location: [facility] ORS;  Service: General;  Laterality: N/A;  . SIGMOIDOSCOPY N/A 09/12/2015   Procedure: Lonell Face;  Surgeon: Florene Glen, MD;  Location: [facility] ORS;  Service: General;  Laterality: N/A;  . WISDOM TOOTH EXTRACTION      Social History:  reports that she quit smoking about 3 years ago. Her smoking use included cigarettes. She has a 1.50 pack-year smoking history. She has never used smokeless tobacco. She reports that she does not drink alcohol and does not use drugs. Family History:  Family History  Problem Relation Age of Onset  . Depression Mother   . Hypertension Mother   . Sleep apnea Mother   . Asthma Mother   . COPD Mother   . Diabetes Mother   . Breast cancer Maternal Aunt        20's     HOME MEDICATIONS: Allergies as of 04/24/2020      Reactions   Betadine [povidone Iodine] Other (See Comments)   Reaction:  Unknown    Fish Allergy Hives   Iodine Rash   Shellfish-derived Products Other (See Comments)   Reaction:  Unknown       Medication List       Accurate as of April 24, 2020  7:15 AM. If you have any questions, ask your nurse or doctor.        escitalopram 5 MG tablet Commonly known as: LEXAPRO Take 5 mg by  mouth every morning.   folic acid 1 MG tablet Commonly known as: FOLVITE folic acid 1 mg tablet  Take 1 tablet every day by oral route.   hydrOXYzine 50 MG capsule Commonly known as: VISTARIL Take 50 mg by mouth 2 (two) times daily as needed.   levothyroxine 50 MCG tablet Commonly known as: SYNTHROID Take 1 tablet (50 mcg total) by mouth daily.   lithium carbonate 300 MG capsule Take 1 capsule (300 mg total) by mouth 2 (two) times daily with a meal.   paliperidone 6 MG 24 hr tablet Commonly known as: INVEGA Take 6 mg by mouth every morning.   prazosin 2 MG capsule Commonly known as: MINIPRESS Take 1 capsule (2 mg total) by mouth at bedtime.   ProAir HFA 108 (90 Base) MCG/ACT inhaler Generic drug: albuterol ProAir HFA 90 mcg/actuation aerosol inhaler  Inhale 2 puffs every 4 hours by inhalation route as needed.         OBJECTIVE:   PHYSICAL EXAM: VS: BP 122/78   Pulse 72   Ht 5\' 8"  (1.727 m)  Wt 285 lb 2 oz (129.3 kg)   LMP 04/04/2020   SpO2 98%   BMI 43.35 kg/m   EXAM: General: Pt appears well and is in NAD  Neck: General: Supple without adenopathy. Thyroid: Thyroid size normal.    Lungs: Clear with good BS bilat with no rales, rhonchi, or wheezes  Heart: Auscultation: RRR.  Abdomen: Normoactive bowel sounds, soft, nontender, without masses or organomegaly palpable  Extremities:  BL LE: No pretibial edema normal ROM and strength.  Mental Status: Judgment, insight: Intact Mood and affect: No depression, anxiety, or agitation     DATA REVIEWED: Results for LONI, DELBRIDGE (MRN [id number]) as of 04/26/2020 15:34  Ref. Range 04/24/2020 11:41  Sodium Latest Ref Range: 135 - 145 mEq/L 139  Potassium Latest Ref Range: 3.5 - 5.1 mEq/L 4.0  Chloride Latest Ref Range: 96 - 112 mEq/L 104  CO2 Latest Ref Range: 19 - 32 mEq/L 27  Glucose Latest Ref Range: 70 - 99 mg/dL 84  BUN Latest Ref Range: 6 - 23 mg/dL 17  Creatinine Latest Ref Range: 0.40 - 1.20  mg/dL 0.71  Calcium Latest Ref Range: 8.4 - 10.5 mg/dL 10.1  GFR Latest Ref Range: >60.00 mL/min 113.72  Prolactin Latest Units: ng/mL 23.0  TSH Latest Ref Range: 0.35 - 4.50 uIU/mL 1.48    MRI brain 09/09/2019 Scattered foci of T2 hyperintensity are seen within the white matter of the cerebral hemispheres predominantly deep and juxtacortical white matter. No posterior fossa lesion identified. None of the lesions show enhancement after the intravenous administration of contrast.  Pituitary/Sella: The pituitary gland is normal in appearance and size for age without mass lesion. Mildly heterogeneous enhancement in the dynamic phase with homogeneous enhancement on the delayed is within normal limits. A normal posterior pituitary bright spot is seen. The infundibulum is midline. The hypothalamus and mamillary bodies are normal. There is no mass effect on the optic chiasm or optic nerves. The infundibular and chiasmatic recesses are clear. Normal cavernous sinus and cavernous internal carotid artery flow voids.  ASSESSMENT / PLAN / RECOMMENDATIONS:    1.   Hypothyroidism  - Pt is clinically and biochemically euthyroid  - No local neck symptoms      Medication Continue Levothyroxine 50 MCG daily    2.Hyperprolactinemia:    -Patient with chronic long-term history of intermittent hyperprolactinemia.This is attributed to multiple antipsychotic medications that have she has been on since her teenage years, that are known to cause hyperprolactinemia such as risperidone and paliperidone Lorayne Bender ) -Repeat prolactin levels have been normal  - Menstruation has been regular and galactorrhea has resolved.  - MRI did NOT show any evidence of pituitary adenoma   3. Weight gain:  - She was advised to follow a certain diet for at least 6 months options such as weight watchers, noom and optavia we discussed     4. Hx of Hypoglycemia :  - Pt to check finger stick fasting and  when symptomstic, she needs to notify our office if BG < 60 mg/dL.  - Serum glucose normal on today's labs     F/U in 4 months     Signed electronically by: Mack Guise, MD  [facility]  [address] [place], [place] [address] Phone: [phone] FAX: [phone]      CC: Casilda Carls, [place] [address] Phone: [phone]  Fax: [phone]   Return to Endocrinology clinic as below: Future Appointments  Date Time Provider [place]  04/24/2020 10:50 AM Shareese Macha, Melanie Crazier, MD [facility] None  08/24/2020 10:00 AM Philip Aspen, CNM [facility] None

## 2020-04-24 NOTE — Patient Instructions (Signed)

## 2020-04-25 LAB — PROLACTIN: Prolactin: 23 ng/mL

## 2020-04-26 DIAGNOSIS — Z8639 Personal history of other endocrine, nutritional and metabolic disease: Secondary | ICD-10-CM | POA: Insufficient documentation

## 2020-07-15 ENCOUNTER — Other Ambulatory Visit: Payer: Self-pay | Admitting: Internal Medicine

## 2020-08-24 ENCOUNTER — Other Ambulatory Visit: Payer: Self-pay

## 2020-08-24 ENCOUNTER — Ambulatory Visit (INDEPENDENT_AMBULATORY_CARE_PROVIDER_SITE_OTHER): Payer: Medicare Other | Admitting: Certified Nurse Midwife

## 2020-08-24 ENCOUNTER — Encounter: Payer: Self-pay | Admitting: Certified Nurse Midwife

## 2020-08-24 VITALS — BP 137/83 | HR 74 | Ht 68.0 in | Wt 287.6 lb

## 2020-08-24 DIAGNOSIS — Z01419 Encounter for gynecological examination (general) (routine) without abnormal findings: Secondary | ICD-10-CM | POA: Diagnosis not present

## 2020-08-24 DIAGNOSIS — Z113 Encounter for screening for infections with a predominantly sexual mode of transmission: Secondary | ICD-10-CM | POA: Diagnosis not present

## 2020-08-24 DIAGNOSIS — Z0000 Encounter for general adult medical examination without abnormal findings: Secondary | ICD-10-CM

## 2020-08-24 DIAGNOSIS — Z114 Encounter for screening for human immunodeficiency virus [HIV]: Secondary | ICD-10-CM

## 2020-08-24 NOTE — Patient Instructions (Signed)
Preventive Care 21-31 Years Old, Female Preventive care refers to lifestyle choices and visits with your health care provider that can promote health and wellness. This includes:  A yearly physical exam. This is also called an annual wellness visit.  Regular dental and eye exams.  Immunizations.  Screening for certain conditions.  Healthy lifestyle choices, such as: ? Eating a healthy diet. ? Getting regular exercise. ? Not using drugs or products that contain nicotine and tobacco. ? Limiting alcohol use. What can I expect for my preventive care visit? Physical exam Your health care provider may check your:  Height and weight. These may be used to calculate your BMI (body mass index). BMI is a measurement that tells if you are at a healthy weight.  Heart rate and blood pressure.  Body temperature.  Skin for abnormal spots. Counseling Your health care provider may ask you questions about your:  Past medical problems.  Family's medical history.  Alcohol, tobacco, and drug use.  Emotional well-being.  Home life and relationship well-being.  Sexual activity.  Diet, exercise, and sleep habits.  Work and work environment.  Access to firearms.  Method of birth control.  Menstrual cycle.  Pregnancy history. What immunizations do I need? Vaccines are usually given at various ages, according to a schedule. Your health care provider will recommend vaccines for you based on your age, medical history, and lifestyle or other factors, such as travel or where you work.   What tests do I need? Blood tests  Lipid and cholesterol levels. These may be checked every 5 years starting at age 20.  Hepatitis C test.  Hepatitis B test. Screening  Diabetes screening. This is done by checking your blood sugar (glucose) after you have not eaten for a while (fasting).  STD (sexually transmitted disease) testing, if you are at risk.  BRCA-related cancer screening. This may be  done if you have a family history of breast, ovarian, tubal, or peritoneal cancers.  Pelvic exam and Pap test. This may be done every 3 years starting at age 21. Starting at age 30, this may be done every 5 years if you have a Pap test in combination with an HPV test. Talk with your health care provider about your test results, treatment options, and if necessary, the need for more tests.   Follow these instructions at home: Eating and drinking  Eat a healthy diet that includes fresh fruits and vegetables, whole grains, lean protein, and low-fat dairy products.  Take vitamin and mineral supplements as recommended by your health care provider.  Do not drink alcohol if: ? Your health care provider tells you not to drink. ? You are pregnant, may be pregnant, or are planning to become pregnant.  If you drink alcohol: ? Limit how much you have to 0-1 drink a day. ? Be aware of how much alcohol is in your drink. In the [place] one drink equals one 12 oz bottle of beer (355 mL), one 5 oz glass of wine (148 mL), or one 1 oz glass of hard liquor (44 mL).   Lifestyle  Take daily care of your teeth and gums. Brush your teeth every morning and night with fluoride toothpaste. Floss one time each day.  Stay active. Exercise for at least 30 minutes 5 or more days each week.  Do not use any products that contain nicotine or tobacco, such as cigarettes, e-cigarettes, and chewing tobacco. If you need help quitting, ask your health care provider.  Do not   use drugs.  If you are sexually active, practice safe sex. Use a condom or other form of protection to prevent STIs (sexually transmitted infections).  If you do not wish to become pregnant, use a form of birth control. If you plan to become pregnant, see your health care provider for a prepregnancy visit.  Find healthy ways to cope with stress, such as: ? Meditation, yoga, or listening to music. ? Journaling. ? Talking to a trusted  person. ? Spending time with friends and family. Safety  Always wear your seat belt while driving or riding in a vehicle.  Do not drive: ? If you have been drinking alcohol. Do not ride with someone who has been drinking. ? When you are tired or distracted. ? While texting.  Wear a helmet and other protective equipment during sports activities.  If you have firearms in your house, make sure you follow all gun safety procedures.  Seek help if you have been physically or sexually abused. What's next?  Go to your health care provider once a year for an annual wellness visit.  Ask your health care provider how often you should have your eyes and teeth checked.  Stay up to date on all vaccines. This information is not intended to replace advice given to you by your health care provider. Make sure you discuss any questions you have with your health care provider. Document Revised: 12/29/2019 Document Reviewed: 01/11/2018 Elsevier Patient Education  2021 [place] Breast self-awareness is knowing how your breasts look and feel. Doing breast self-awareness is important. It allows you to catch a breast problem early while it is still small and can be treated. All women should do breast self-awareness, including women who have had breast implants. Tell your doctor if you notice a change in your breasts. What you need:  A mirror.  A well-lit room. How to do a breast self-exam A breast self-exam is one way to learn what is normal for your breasts and to check for changes. To do a breast self-exam: Look for changes 1. Take off all the clothes above your waist. 2. Stand in front of a mirror in a room with good lighting. 3. Put your hands on your hips. 4. Push your hands down. 5. Look at your breasts and nipples in the mirror to see if one breast or nipple looks different from the other. Check to see if: ? The shape of one breast is different. ? The size of one  breast is different. ? There are wrinkles, dips, and bumps in one breast and not the other. 6. Look at each breast for changes in the skin, such as: ? Redness. ? Scaly areas. 7. Look for changes in your nipples, such as: ? Liquid around the nipples. ? Bleeding. ? Dimpling. ? Redness. ? A change in where the nipples are.   Feel for changes 1. Lie on your back on the floor. 2. Feel each breast. To do this, follow these steps: ? Pick a breast to feel. ? Put the arm closest to that breast above your head. ? Use your other arm to feel the nipple area of your breast. Feel the area with the pads of your three middle fingers by making small circles with your fingers. For the first circle, press lightly. For the second circle, press harder. For the third circle, press even harder. ? Keep making circles with your fingers at the different pressures as you move down your breast. Stop when  you feel your ribs. ? Move your fingers a little toward the center of your body. ? Start making circles with your fingers again, this time going up until you reach your collarbone. ? Keep making up-and-down circles until you reach your armpit. Remember to keep using the three pressures. ? Feel the other breast in the same way. 3. Sit or stand in the tub or shower. 4. With soapy water on your skin, feel each breast the same way you did in step 2 when you were lying on the floor.   Write down what you find Writing down what you find can help you remember what to tell your doctor. Write down:  What is normal for each breast.  Any changes you find in each breast, including: ? The kind of changes you find. ? Whether you have pain. ? Size and location of any lumps.  When you last had your menstrual period. General tips  Check your breasts every month.  If you are breastfeeding, the best time to check your breasts is after you feed your baby or after you use a breast pump.  If you get menstrual periods, the  best time to check your breasts is 5-7 days after your menstrual period is over.  With time, you will become comfortable with the self-exam, and you will begin to know if there are changes in your breasts. Contact a doctor if you:  See a change in the shape or size of your breasts or nipples.  See a change in the skin of your breast or nipples, such as red or scaly skin.  Have fluid coming from your nipples that is not normal.  Find a lump or thick area that was not there before.  Have pain in your breasts.  Have any concerns about your breast health. Summary  Breast self-awareness includes looking for changes in your breasts, as well as feeling for changes within your breasts.  Breast self-awareness should be done in front of a mirror in a well-lit room.  You should check your breasts every month. If you get menstrual periods, the best time to check your breasts is 5-7 days after your menstrual period is over.  Let your doctor know of any changes you see in your breasts, including changes in size, changes on the skin, pain or tenderness, or fluid from your nipples that is not normal. This information is not intended to replace advice given to you by your health care provider. Make sure you discuss any questions you have with your health care provider. Document Revised: 12/19/2017 Document Reviewed: 12/19/2017 Elsevier Patient Education  [place].

## 2020-08-24 NOTE — Progress Notes (Signed)
Pt present for annual exam. Pt stated that she was doing well.

## 2020-08-24 NOTE — Progress Notes (Signed)
GYNECOLOGY ANNUAL PREVENTATIVE CARE ENCOUNTER NOTE  History:     Melanie Bell is a 30 y.o. G15P0030 female here for a routine annual gynecologic exam.  Current complaints: none.   Denies abnormal vaginal bleeding, discharge, pelvic pain, problems with intercourse or other gynecologic concerns.     Social Relationship: engaged Living: with her fiance  Work: none Exercise:1-2 times a week 30 min  Smoke/Alcohol/drug use: denies  Gynecologic History Patient's last menstrual period was 08/14/2020. Contraception: none Last Pap: 09/10/19 Results were: normal with negative HPV Last mammogram: n/a .    Upstream - 08/24/20 0948      Pregnancy Intention Screening   Does the patient want to become pregnant in the next year? Yes    Does the patient's partner want to become pregnant in the next year? Yes    Would the patient like to discuss contraceptive options today? No      Contraception Wrap Up   Current Method No Method - Other Reason          The pregnancy intention screening data noted above was reviewed. Potential methods of contraception were discussed. The patient elected to proceed with No Method - Other Reason.    Obstetric History OB History  Gravida Para Term Preterm AB Living  3 0 0 0 3 0  SAB IAB Ectopic Multiple Live Births  3 0 0 0      # Outcome Date GA Lbr Len/2nd Weight Sex Delivery Anes PTL Lv  3 SAB 2021          2 SAB 2021          1 SAB 2021            Past Medical History:  Diagnosis Date  . Anxiety   . Asthma   . Depression   . GERD (gastroesophageal reflux disease)   . Hallucinations 09/30/2014   Sizoaffective  . Hyperlipidemia   . Hypertension   . Intentional ingestion of batteries 04/21/2016    2 AAA batteries and 1 thumb tack; intent to hurt herself/notes 04/21/2016  . Tardive dyskinesia 10/2014   recent onset    Past Surgical History:  Procedure Laterality Date  . ABDOMINAL SURGERY     "years ago" to remove foreign  objects  . APPENDECTOMY    . BREAST EXCISIONAL BIOPSY Right 09/03/2007   surgical bx removed fibroadeoma  . BREAST LUMPECTOMY Right   . COLONOSCOPY WITH PROPOFOL N/A 09/10/2015   Procedure: COLONOSCOPY WITH PROPOFOL;  Surgeon: [name], MD;  Location: [facility] ENDOSCOPY;  Service: Endoscopy;  Laterality: N/A;  . ESOPHAGOGASTRODUODENOSCOPY N/A 11/28/2014   Procedure: ESOPHAGOGASTRODUODENOSCOPY (EGD);  Surgeon: [name], MD;  Location: [facility] ENDOSCOPY;  Service: Endoscopy;  Laterality: N/A;  . ESOPHAGOGASTRODUODENOSCOPY N/A 02/21/2016   Procedure: ESOPHAGOGASTRODUODENOSCOPY (EGD);  Surgeon: Mauri Pole, MD;  Location: [facility] ENDOSCOPY;  Service: Endoscopy;  Laterality: N/A;  . ESOPHAGOGASTRODUODENOSCOPY N/A 04/21/2016   Procedure: ESOPHAGOGASTRODUODENOSCOPY (EGD);  Surgeon: Gatha Mayer, MD;  Location: [facility] ENDOSCOPY;  Service: Endoscopy;  Laterality: N/A;  . ESOPHAGOGASTRODUODENOSCOPY N/A 05/06/2016   Procedure: ESOPHAGOGASTRODUODENOSCOPY (EGD);  Surgeon: Jonathon Bellows, MD;  Location: [facility] ENDOSCOPY;  Service: Gastroenterology;  Laterality: N/A;  . ESOPHAGOGASTRODUODENOSCOPY N/A 06/13/2016   Procedure: ESOPHAGOGASTRODUODENOSCOPY (EGD);  Surgeon: [name], MD;  Location: [facility] ENDOSCOPY;  Service: Endoscopy;  Laterality: N/A;  . ESOPHAGOGASTRODUODENOSCOPY (EGD) WITH PROPOFOL N/A 02/29/2016   Procedure: ESOPHAGOGASTRODUODENOSCOPY (EGD) WITH PROPOFOL;  Surgeon: [name], MD;  Location: [facility] ENDOSCOPY;  Service:  Endoscopy;  Laterality: N/A;  . ESOPHAGOGASTRODUODENOSCOPY (EGD) WITH PROPOFOL N/A 01/15/2018   Procedure: ESOPHAGOGASTRODUODENOSCOPY (EGD) WITH PROPOFOL;  Surgeon: Daneil Dolin, MD;  Location: [facility] ENDO SUITE;  Service: Endoscopy;  Laterality: N/A;  retrieval forein body  . LAPAROTOMY N/A 09/12/2015   Procedure: EXPLORATORY LAPAROTOMY;  Surgeon: Florene Glen, MD;  Location: [facility] ORS;  Service: General;  Laterality: N/A;  . SIGMOIDOSCOPY N/A 09/12/2015   Procedure:  Lonell Face;  Surgeon: Florene Glen, MD;  Location: [facility] ORS;  Service: General;  Laterality: N/A;  . WISDOM TOOTH EXTRACTION      Current Outpatient Medications on File Prior to Visit  Medication Sig Dispense Refill  . albuterol (VENTOLIN HFA) 108 (90 Base) MCG/ACT inhaler ProAir HFA 90 mcg/actuation aerosol inhaler  Inhale 2 puffs every 4 hours by inhalation route as needed.    Marland Kitchen escitalopram (LEXAPRO) 5 MG tablet Take 5 mg by mouth every morning.    . hydrOXYzine (VISTARIL) 50 MG capsule Take 50 mg by mouth 2 (two) times daily as needed.    Marland Kitchen levothyroxine (SYNTHROID) 50 MCG tablet TAKE 1 TABLET BY MOUTH EACH DAY 90 tablet 1  . lithium carbonate 300 MG capsule Take 1 capsule (300 mg total) by mouth 2 (two) times daily with a meal. 60 capsule 1  . paliperidone (INVEGA) 6 MG 24 hr tablet Take 6 mg by mouth every morning.    . prazosin (MINIPRESS) 2 MG capsule Take 1 capsule (2 mg total) by mouth at bedtime. 30 capsule 0   No current facility-administered medications on file prior to visit.    Allergies  Allergen Reactions  . Betadine [Povidone Iodine] Other (See Comments)    Reaction:  Unknown   . Fish Allergy Hives  . Iodine Rash  . Shellfish-Derived Products Other (See Comments)    Reaction:  Unknown     Social History:  reports that she quit smoking about 4 years ago. Her smoking use included cigarettes. She has a 1.50 pack-year smoking history. She has never used smokeless tobacco. She reports that she does not drink alcohol and does not use drugs.  Family History  Problem Relation Age of Onset  . Depression Mother   . Hypertension Mother   . Sleep apnea Mother   . Asthma Mother   . COPD Mother   . Diabetes Mother   . Breast cancer Maternal Aunt        20's    The following portions of the patient's history were reviewed and updated as appropriate: allergies, current medications, past family history, past medical history, past social history, past surgical history  and problem list.  Review of Systems Pertinent items noted in HPI and remainder of comprehensive ROS otherwise negative.  Physical Exam:  BP 137/83   Pulse 74   Ht $R'5\' 8"'ao$  (1.727 m)   Wt 287 lb 9.6 oz (130.5 kg)   LMP 08/14/2020   BMI 43.73 kg/m  CONSTITUTIONAL: Well-developed, well-nourished, obese, female in no acute distress.  HENT:  Normocephalic, atraumatic, External right and left ear normal. Oropharynx is clear and moist EYES: Conjunctivae and EOM are normal. Pupils are equal, round, and reactive to light. No scleral icterus.  NECK: Normal range of motion, supple, no masses.  Normal thyroid.  SKIN: Skin is warm and dry. No rash noted. Not diaphoretic. No erythema. No pallor. MUSCULOSKELETAL: Normal range of motion. No tenderness.  No cyanosis, clubbing, or edema.  2+ distal pulses. NEUROLOGIC: Alert and oriented to person, place, and time.  Normal reflexes, muscle tone coordination.  PSYCHIATRIC: Normal mood and affect. Normal behavior. Normal judgment and thought content. CARDIOVASCULAR: Normal heart rate noted, regular rhythm RESPIRATORY: Clear to auscultation bilaterally. Effort and breath sounds normal, no problems with respiration noted. BREASTS: Symmetric in size. No masses, tenderness, skin changes, nipple drainage, or lymphadenopathy bilaterally.  ABDOMEN: Soft, no distention noted.  No tenderness, rebound or guarding.  PELVIC: Normal appearing external genitalia and urethral meatus; normal appearing vaginal mucosa and cervix.  No abnormal discharge noted.  Pap not due  Normal uterine size, no other palpable masses, no uterine or adnexal tenderness.  .   Assessment and Plan:    1. Encounter for well woman exam with routine gynecological exam  Pap: not due Mammogram : n/a  Labs: HIV, Hep C Refills: none Referral:none Pt interested in getting pregnant , encouraged PNV, exercise, healthy life style, use of ovulation kit and or period app to time intercourse for pregnancy.  She verbalizes and agrees.  Routine preventative health maintenance measures emphasized. Please refer to After Visit Summary for other counseling recommendations.      Philip Aspen, CNM [facility]

## 2020-08-25 LAB — HEPATITIS C ANTIBODY: Hep C Virus Ab: <0.1 s/co ratio (ref 0.0–0.9)

## 2020-08-25 LAB — HIV ANTIBODY (ROUTINE TESTING W REFLEX): HIV Screen 4th Generation wRfx: NONREACTIVE

## 2020-09-01 ENCOUNTER — Other Ambulatory Visit: Payer: Self-pay

## 2020-09-01 ENCOUNTER — Ambulatory Visit
Admission: EM | Admit: 2020-09-01 | Discharge: 2020-09-01 | Disposition: A | Discharge status: Home / Self Care | Payer: Medicare Other

## 2020-09-01 ENCOUNTER — Ambulatory Visit (INDEPENDENT_AMBULATORY_CARE_PROVIDER_SITE_OTHER): Payer: Medicare Other

## 2020-09-01 DIAGNOSIS — M25461 Effusion, right knee: Secondary | ICD-10-CM | POA: Diagnosis not present

## 2020-09-01 DIAGNOSIS — S8391XA Sprain of unspecified site of right knee, initial encounter: Secondary | ICD-10-CM | POA: Diagnosis not present

## 2020-09-01 DIAGNOSIS — W19XXXA Unspecified fall, initial encounter: Secondary | ICD-10-CM | POA: Diagnosis not present

## 2020-09-01 DIAGNOSIS — M25561 Pain in right knee: Secondary | ICD-10-CM

## 2020-09-01 IMAGING — CR DG KNEE COMPLETE 4+V*R*
4 series · 4 of 4 positions shown · non-contrast
Comparison: No recent.

CLINICAL DATA: Pain and swelling.  Instability.  Recent fall.

EXAM:
RIGHT KNEE - COMPLETE 4+ VIEW

[knee ap]
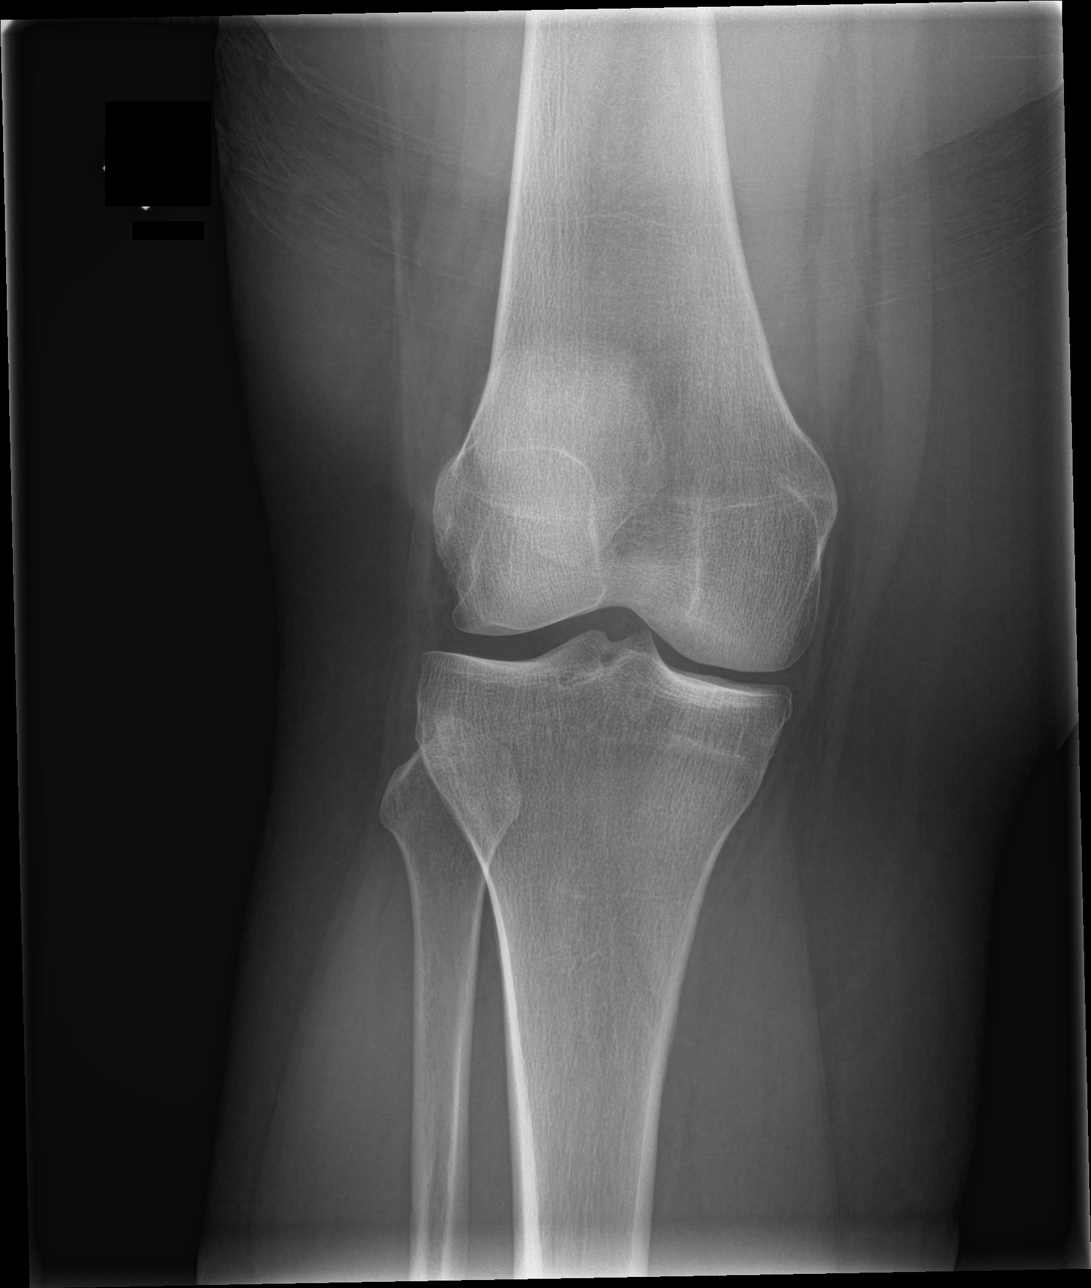

[knee lat]
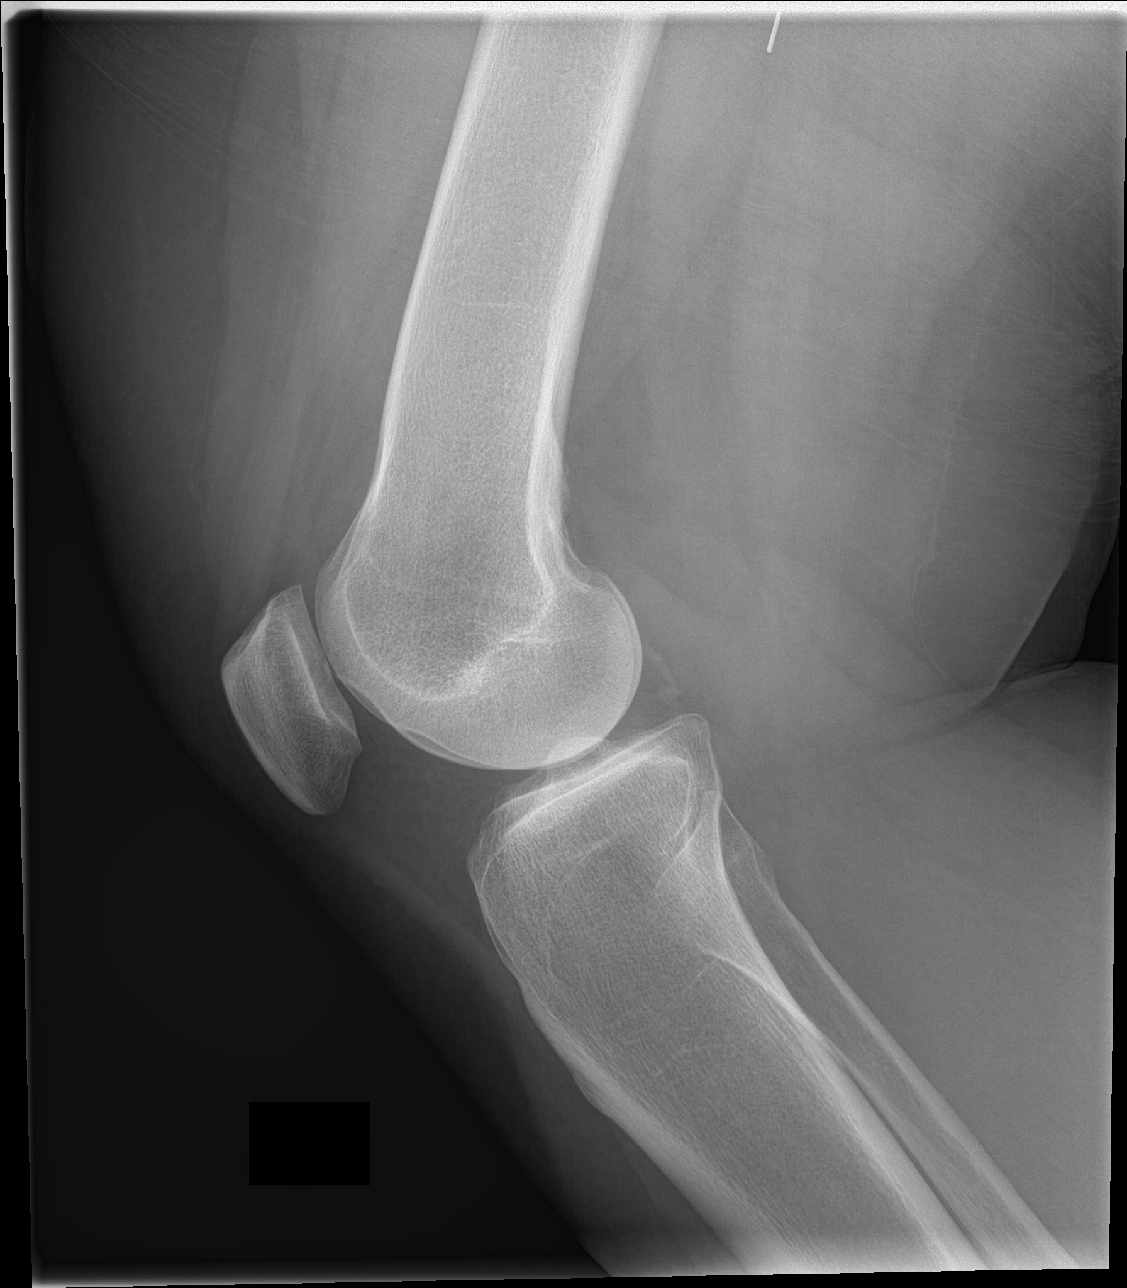

[tunnel]
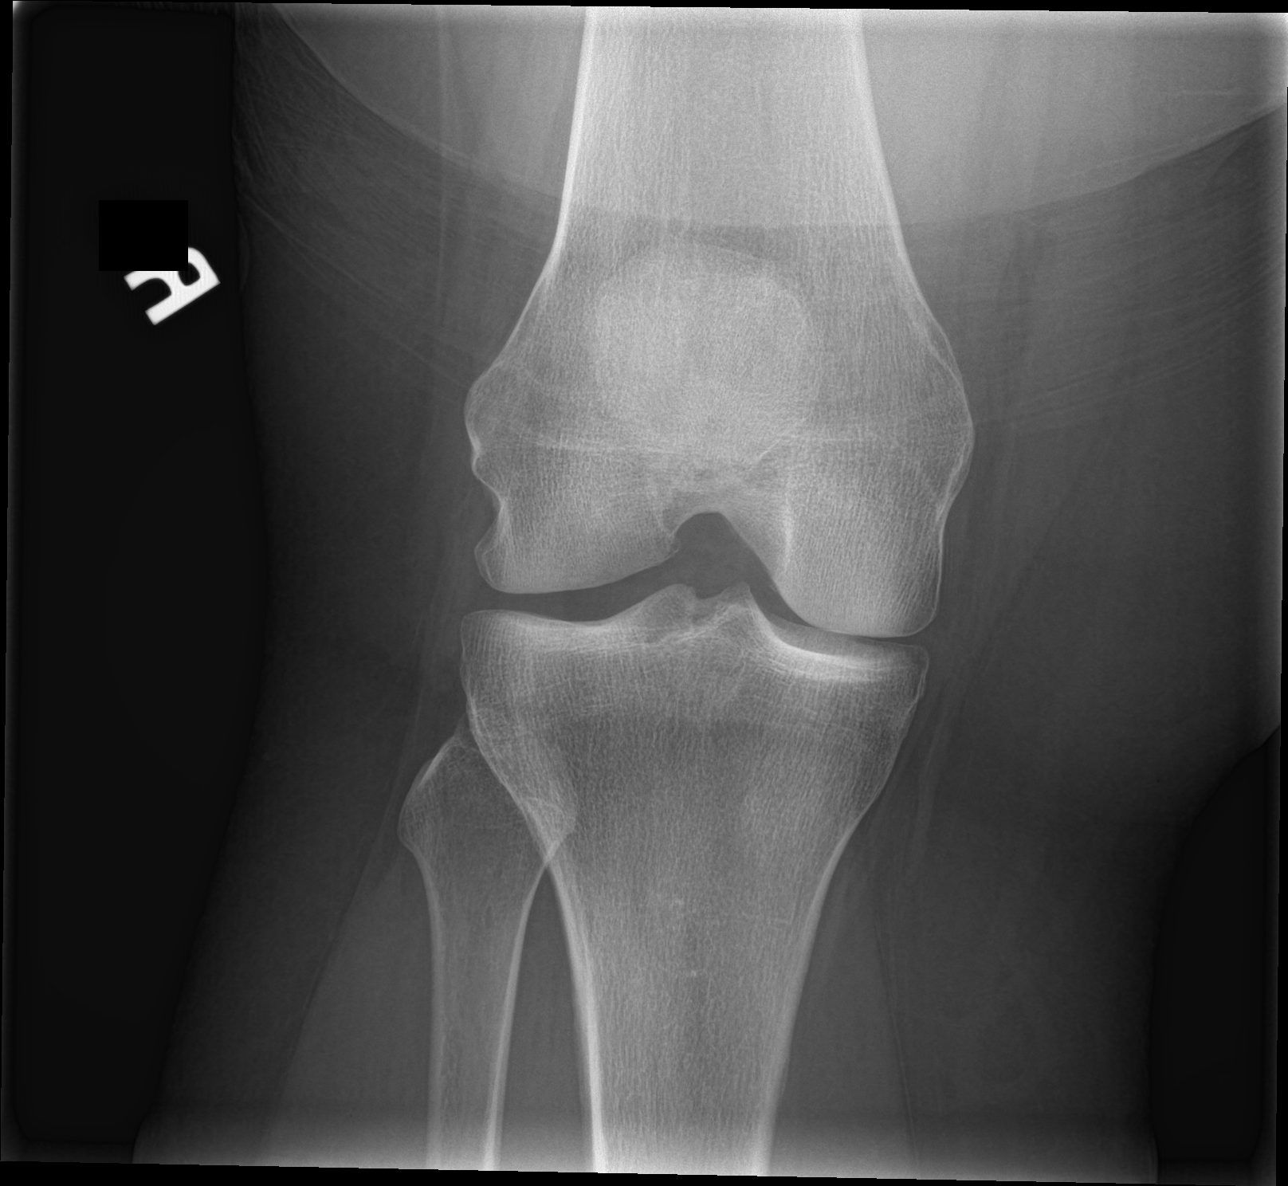

[patella skyline]
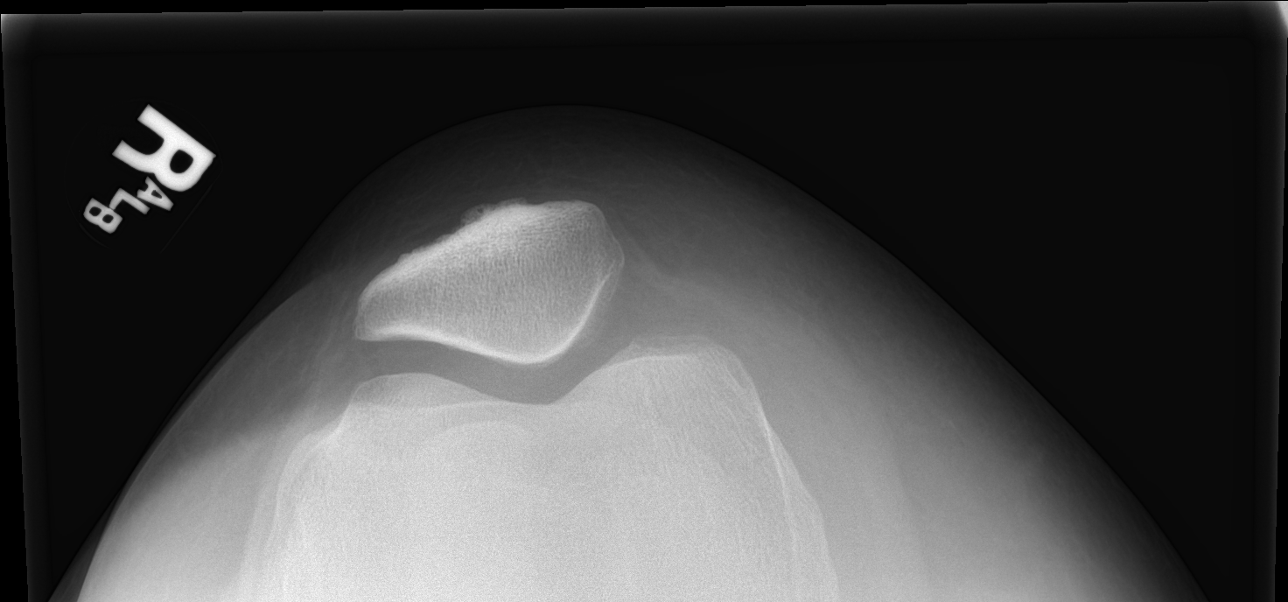

[4 of 4 positions shown; findings below may reference images not displayed]

FINDINGS: No prominent effusion. Mild medial compartment degenerative change.
No acute bony or joint abnormality. No evidence of fracture
dislocation.
IMPRESSION: Mild medial compartment degenerative change.  No acute abnormality.

## 2020-09-01 MED ORDER — PREDNISONE 10 MG (21) PO TBPK: ORAL_TABLET | ORAL | 0 refills | Status: DC

## 2020-09-01 NOTE — ED Provider Notes (Signed)
[facility]    CSN: [id number] Arrival date & time: 09/01/20  1018      History   Chief Complaint Chief Complaint  Patient presents with  . Knee Pain    Right     HPI Melanie Bell is a 30 y.o. female.   HPI   30 year old female here for evaluation of right knee pain.  Patient reports that 2 to 3 days ago she experienced what she refers to as "her knee locked up" and since then she has had an increase in global pain to her right knee.  Patient suffered a fall in February which resulted in her landing on her kneecap.  At that time she went to [facility] for evaluation but left before being seen or radiograph.  Patient reports that she feels her knee is unstable and will buckle from time to time.  Currently she is having a throbbing pain just below her kneecap and has been using a cane for stability, hinged knee brace, and taking prescribed Mobic which all have helped some.  She denies numbness, tingling, or swelling of her lower leg.  Past Medical History:  Diagnosis Date  . Anxiety   . Asthma   . Depression   . GERD (gastroesophageal reflux disease)   . Hallucinations 09/30/2014   Sizoaffective  . Hyperlipidemia   . Hypertension   . Intentional ingestion of batteries 04/21/2016    2 AAA batteries and 1 thumb tack; intent to hurt herself/notes 04/21/2016  . Tardive dyskinesia 10/2014   recent onset    Patient Active Problem List   Diagnosis Date Noted  . History of hypoglycemia 04/26/2020  . History of hyperprolactinemia 04/26/2020  . Subclinical hypothyroidism 08/02/2019  . Hyperprolactinemia ([place]) 08/02/2019  . Schizoaffective disorder, depressive type ([place]) 02/02/2018  . Suicide attempt ([place]) 01/16/2018  . Dysfunctional uterine bleeding 01/06/2018  . Borderline personality disorder ([place]) 01/04/2018  . PTSD (post-traumatic stress disorder) 01/03/2018  . Suicidal ideation 01/02/2018  . Self-inflicted injury 99/83/3825  . ASCUS of cervix with  negative high risk HPV 08/28/2017  . Malingering 05/06/2016  . Foreign body aspiration   . Other foreign object in esophagus causing other injury, subsequent encounter   . Swallowed foreign body 04/23/2016  . Foreign body ingestion 04/21/2016  . Gastric foreign body   . Breast tumor 03/05/2016  . Schizoaffective disorder, bipolar type ([place]) 11/06/2014  . Hypothyroidism 09/29/2014  . Hypertension 09/29/2014    Past Surgical History:  Procedure Laterality Date  . ABDOMINAL SURGERY     "years ago" to remove foreign objects  . APPENDECTOMY    . BREAST EXCISIONAL BIOPSY Right 09/03/2007   surgical bx removed fibroadeoma  . BREAST LUMPECTOMY Right   . COLONOSCOPY WITH PROPOFOL N/A 09/10/2015   Procedure: COLONOSCOPY WITH PROPOFOL;  Surgeon: [name], MD;  Location: [facility] ENDOSCOPY;  Service: Endoscopy;  Laterality: N/A;  . ESOPHAGOGASTRODUODENOSCOPY N/A 11/28/2014   Procedure: ESOPHAGOGASTRODUODENOSCOPY (EGD);  Surgeon: [name], MD;  Location: [facility] ENDOSCOPY;  Service: Endoscopy;  Laterality: N/A;  . ESOPHAGOGASTRODUODENOSCOPY N/A 02/21/2016   Procedure: ESOPHAGOGASTRODUODENOSCOPY (EGD);  Surgeon: Mauri Pole, MD;  Location: [facility] ENDOSCOPY;  Service: Endoscopy;  Laterality: N/A;  . ESOPHAGOGASTRODUODENOSCOPY N/A 04/21/2016   Procedure: ESOPHAGOGASTRODUODENOSCOPY (EGD);  Surgeon: Gatha Mayer, MD;  Location[facility] ENDOSCOPY;  Service: Endoscopy;  Laterality: N/A;  . ESOPHAGOGASTRODUODENOSCOPY N/A 05/06/2016   Procedure: ESOPHAGOGASTRODUODENOSCOPY (EGD);  Surgeon: Jonathon Bellows, MD;  Location: [facility] ENDOSCOPY;  Service: Gastroenterology;  Laterality: N/A;  .  ESOPHAGOGASTRODUODENOSCOPY N/A 06/13/2016   Procedure: ESOPHAGOGASTRODUODENOSCOPY (EGD);  Surgeon: [name], MD;  Location: [facility] ENDOSCOPY;  Service: Endoscopy;  Laterality: N/A;  . ESOPHAGOGASTRODUODENOSCOPY (EGD) WITH PROPOFOL N/A 02/29/2016   Procedure: ESOPHAGOGASTRODUODENOSCOPY (EGD) WITH PROPOFOL;  Surgeon: [name], MD;  Location: [facility] ENDOSCOPY;  Service: Endoscopy;  Laterality: N/A;  . ESOPHAGOGASTRODUODENOSCOPY (EGD) WITH PROPOFOL N/A 01/15/2018   Procedure: ESOPHAGOGASTRODUODENOSCOPY (EGD) WITH PROPOFOL;  Surgeon: Daneil Dolin, MD;  Location: [facility] ENDO SUITE;  Service: Endoscopy;  Laterality: N/A;  retrieval forein body  . LAPAROTOMY N/A 09/12/2015   Procedure: EXPLORATORY LAPAROTOMY;  Surgeon: Florene Glen, MD;  Location: [facility] ORS;  Service: General;  Laterality: N/A;  . SIGMOIDOSCOPY N/A 09/12/2015   Procedure: Lonell Face;  Surgeon: Florene Glen, MD;  Location: [facility] ORS;  Service: General;  Laterality: N/A;  . WISDOM TOOTH EXTRACTION      OB History    Gravida  3   Para  0   Term  0   Preterm  0   AB  3   Living  0     SAB  3   IAB  0   Ectopic  0   Multiple  0   Live Births               Home Medications    Prior to Admission medications   Medication Sig Start Date End Date Taking? Authorizing Provider  predniSONE (STERAPRED UNI-PAK 21 TAB) 10 MG (21) TBPK tablet Take 6 tablets on day 1, 5 tablets day 2, 4 tablets day 3, 3 tablets day 4, 2 tablets day 5, 1 tablet day 6 09/01/20  Yes Margarette Canada, NP  albuterol (VENTOLIN HFA) 108 (90 Base) MCG/ACT inhaler ProAir HFA 90 mcg/actuation aerosol inhaler  Inhale 2 puffs every 4 hours by inhalation route as needed.    [provider]  escitalopram (LEXAPRO) 5 MG tablet Take 5 mg by mouth every morning. 05/27/19   [provider]  hydrOXYzine (VISTARIL) 50 MG capsule Take 50 mg by mouth 2 (two) times daily as needed. 08/20/19   [provider]  levothyroxine (SYNTHROID) 50 MCG tablet TAKE 1 TABLET BY MOUTH EACH DAY 07/15/20   Shamleffer, Melanie Crazier, MD  lithium carbonate 300 MG capsule Take 1 capsule (300 mg total) by mouth 2 (two) times daily with a meal. 12/11/17   [name], [name], MD  paliperidone (INVEGA) 6 MG 24 hr tablet Take 6 mg by mouth every morning. 05/30/19   [provider]  prazosin (MINIPRESS) 2 MG capsule Take 1 capsule (2 mg total) by mouth at bedtime. 01/12/18   McNew, Tyson Babinski, MD    Family History Family History  Problem Relation Age of Onset  . Depression Mother   . Hypertension Mother   . Sleep apnea Mother   . Asthma Mother   . COPD Mother   . Diabetes Mother   . Breast cancer Maternal Aunt        20's    Social History Social History   Tobacco Use  . Smoking status: Former Smoker    Packs/day: 0.50    Years: 3.00    Pack years: 1.50    Types: Cigarettes    Quit date: 08/2016    Years since quitting: 4.0  . Smokeless tobacco: Never Used  Vaping Use  . Vaping Use: Never used  Substance Use Topics  . Alcohol use: No  . Drug use: No     Allergies  Betadine [povidone iodine], Fish allergy, Iodine, and Shellfish-derived products   Review of Systems Review of Systems  Constitutional: Negative for activity change and fever.  Musculoskeletal: Positive for arthralgias, joint swelling and myalgias.  Skin: Negative for color change, rash and wound.  Neurological: Negative for weakness and numbness.  Hematological: Negative.   Psychiatric/Behavioral: Negative.      Physical Exam Triage Vital Signs ED Triage Vitals [09/01/20 1029]  Enc Vitals Group     BP 111/69     Pulse Rate 79     Resp 18     Temp 98.1 F (36.7 C)     Temp Source Oral     SpO2 100 %     Weight      Height      Head Circumference      Peak Flow      Pain Score      Pain Loc      Pain Edu?      Excl. in [place]?    No data found.  Updated Vital Signs BP 111/69 (BP Location: Right Arm)   Pulse 79   Temp 98.1 F (36.7 C) (Oral)   Resp 18   LMP 08/14/2020   SpO2 100%   Visual Acuity Right Eye Distance:   Left Eye Distance:   Bilateral Distance:    Right Eye Near:   Left Eye Near:    Bilateral Near:     Physical Exam Vitals and nursing note reviewed.  Constitutional:      General: She is not in acute distress.     Appearance: Normal appearance. She is obese. She is not ill-appearing.  HENT:     Head: Normocephalic and atraumatic.  Musculoskeletal:        General: Swelling and tenderness present. No deformity or signs of injury.  Skin:    General: Skin is warm and dry.     Capillary Refill: Capillary refill takes less than 2 seconds.     Findings: No bruising or erythema.  Neurological:     General: No focal deficit present.     Mental Status: She is alert and oriented to person, place, and time.     Sensory: No sensory deficit.     Gait: Gait abnormal.  Psychiatric:        Mood and Affect: Mood normal.        Behavior: Behavior normal.        Thought Content: Thought content normal.        Judgment: Judgment normal.      UC Treatments / Results  Labs (all labs ordered are listed, but only abnormal results are displayed) Labs Reviewed - No data to display  EKG   Radiology DG Knee Complete 4 Views Right  Result Date: 09/01/2020 CLINICAL DATA:  Pain and swelling.  Instability.  Recent fall. EXAM: RIGHT KNEE - COMPLETE 4+ VIEW COMPARISON:  No recent. FINDINGS: No prominent effusion. Mild medial compartment degenerative change. No acute bony or joint abnormality. No evidence of fracture dislocation. IMPRESSION: Mild medial compartment degenerative change.  No acute abnormality. Electronically Signed   By: Marcello Moores  Register   On: 09/01/2020 11:34    Procedures Procedures (including critical care time)  Medications Ordered in UC Medications - No data to display  Initial Impression / Assessment and Plan / UC Course  I have reviewed the triage vital signs and the nursing notes.  Pertinent labs & imaging results that were available during my care of the patient  were reviewed by me and considered in my medical decision making (see chart for details).   Patient is an obese 30 year old female here for evaluation of right knee pain that has been ongoing since February but became exacerbated 2  to 3 days ago when she felt her knee "locked up".  Patient is wearing a hinged knee brace and ambulating with a cane for stability.  The brace was removed for examination of the patient's knee.  Patient has no lower extremity edema and her DP and PT pulses are 2+.  Patient does have swelling and tenderness over her patella, and the medial lateral joint lines, in the popliteal space, and over the tibial tuberosity.  Patient has no ligamentous laxity with anterior posterior drawer testing.  Patient does complain of pain when passively assisted to full extension, which she was able to achieve, and no crepitus was felt on exam.  Patient does have significant tenderness to the medial collateral ligament with valgus stress application and to a lesser degree with varus stress application to the lateral collateral ligament.  Suspect patient's injuries are all soft tissue but given her fall, continued stability, and continued pain will obtain radiograph series of right knee.  Right knee films independently reviewed and evaluated by me.  Interpretation: No evidence of fracture or dislocation.  Very mild degenerative changes noted medially.  Awaiting radiology overread.  Radiology interpretation consistent with my findings.  Will discharge patient home to continue her knee brace for support, elevation of her right knee, moist heat application, and will have her stop her Mobic and start a short course of prednisone.  Patient to follow-up with her orthopedist for continued pain and swelling.   Final Clinical Impressions(s) / UC Diagnoses   Final diagnoses:  Sprain of right knee, unspecified ligament, initial encounter     Discharge Instructions     Your x-rays did not reveal the presence of any dislocations or broken bones.  With the degree of soft tissue swelling and tenderness you have this is most consistent with a soft tissue injury and sprain of your right knee.  Continue to wear your hinged knee brace  to provide support.  Continue to use your cane for ambulation assistance and stability.  Keep your right knee elevated as much as possible to help minimize swelling.  Apply moist heat to your knee for 20 minutes at a time 2-3 times a day to improve blood flow to your knee and help remove the inflammatory byproducts your body is creating from your injury.  Schedule an appointment with your orthopedist for evaluation if your symptoms continue.    ED Prescriptions    Medication Sig Dispense Auth. Provider   predniSONE (STERAPRED UNI-PAK 21 TAB) 10 MG (21) TBPK tablet Take 6 tablets on day 1, 5 tablets day 2, 4 tablets day 3, 3 tablets day 4, 2 tablets day 5, 1 tablet day 6 21 tablet Margarette Canada, NP     PDMP not reviewed this encounter.   Margarette Canada, NP 09/01/20 1151

## 2020-09-01 NOTE — ED Triage Notes (Signed)
Patient presents to Urgent Care with complaints of right knee pain x 2-3 days ago. Pt states she has a hx of osteoarthritis and when standing up she she felt her knee jerk. Treating pain with prescribed Mobic and has on a knee brace for support and inflammation.   Denies fever, numbness or tingling.

## 2020-09-01 NOTE — Discharge Instructions (Addendum)
Your x-rays did not reveal the presence of any dislocations or broken bones.  With the degree of soft tissue swelling and tenderness you have this is most consistent with a soft tissue injury and sprain of your right knee.  Continue to wear your hinged knee brace to provide support.  Continue to use your cane for ambulation assistance and stability.  Keep your right knee elevated as much as possible to help minimize swelling.  Apply moist heat to your knee for 20 minutes at a time 2-3 times a day to improve blood flow to your knee and help remove the inflammatory byproducts your body is creating from your injury.  Schedule an appointment with your orthopedist for evaluation if your symptoms continue.

## 2020-10-27 NOTE — Progress Notes (Signed)
[name]  MRN/ DOB: [id number], 02-07-1991    Age/ Sex: 30 y.o., female     PCP: Casilda Carls, MD   Reason for Endocrinology Evaluation: Hyperprolactinemia      Initial Endocrinology Clinic Visit: 08/02/2019    PATIENT IDENTIFIER: Melanie Bell is a 30 y.o., female with a past medical history of Bipolar disorder and anxiety. She has followed with [facility] since 08/02/2019 for consultative assistance with management of her hyperprolactinemia .   HISTORICAL SUMMARY:  Pt was noted to have an elevated prolactin level at 66.3 ng/mL, during evaluation of secondary amenorrhea and galactorrhea in 06/2019.    She does endorse history of elevated prolactin since her teenage years.  She has been diagnosed with bipolar disorder, PTSD and anxiety and has been on multiple antipsychotic medications over the years.  She has been on ox carbamazepine as well as risperidone in the past.   Prolactin on initial presentation to our clinic was 40.2 ng/mL, with normal LH, FSH, Testosterone and TSH    An MRI was obtained 09/09/2019 which demonstrated normal pituitary but abnormal white matter.  Pt was advised to discuss switching Invega with psych as it has been associated with elevated prolactin .    She was seen by Dr. Tomi Likens ( Neurology for abnormal white matter)   Sister  And mother with hypothyroidism and  Sister with hyperthyroidism       HYPOTHYROID HISTORY:  Patient reports being on LT-4 replacement therapy since her teenage years, she had ran out of her medication by 06/2019 and was advised not to restart since her TFTs were normal. Repeat TSH elevated in 07/2019 was elevated at 4.77 uIU/mL. And LT-4 replacement restarted.   SUBJECTIVE:    Today (12/20/2019):  Melanie Bell is here for hyperprolactinemia and hypothyroidism.   Pt has been noted with weight gain  Denies constipation  Denies local neck symptoms   LMP 10/14/2020 Denies  Galactorrhea  Has occasional headaches but they are improving    She has stopped drinking all sugar-sweetened beverages 2 months ago   Starting cosmetology school in 12/2020  Medications Levothyroxine 50 mcg daily      HISTORY:  Past Medical History:  Past Medical History:  Diagnosis Date   Anxiety    Asthma    Depression    GERD (gastroesophageal reflux disease)    Hallucinations 09/30/2014   Sizoaffective   Hyperlipidemia    Hypertension    Intentional ingestion of batteries 04/21/2016    2 AAA batteries and 1 thumb tack; intent to hurt herself/notes 04/21/2016   Tardive dyskinesia 10/2014   recent onset   Past Surgical History:  Past Surgical History:  Procedure Laterality Date   ABDOMINAL SURGERY     "years ago" to remove foreign objects   APPENDECTOMY     BREAST EXCISIONAL BIOPSY Right 09/03/2007   surgical bx removed fibroadeoma   BREAST LUMPECTOMY Right    COLONOSCOPY WITH PROPOFOL N/A 09/10/2015   Procedure: COLONOSCOPY WITH PROPOFOL;  Surgeon: [name], MD;  Location[facility] ENDOSCOPY;  Service: Endoscopy;  Laterality: N/A;   ESOPHAGOGASTRODUODENOSCOPY N/A 11/28/2014   Procedure: ESOPHAGOGASTRODUODENOSCOPY (EGD);  Surgeon: [name], MD;  Location: [facility] ENDOSCOPY;  Service: Endoscopy;  Laterality: N/A;   ESOPHAGOGASTRODUODENOSCOPY N/A 02/21/2016   Procedure: ESOPHAGOGASTRODUODENOSCOPY (EGD);  Surgeon: Mauri Pole, MD;  Location: [facility] ENDOSCOPY;  Service: Endoscopy;  Laterality: N/A;   ESOPHAGOGASTRODUODENOSCOPY N/A 04/21/2016   Procedure: ESOPHAGOGASTRODUODENOSCOPY (EGD);  Surgeon: Gatha Mayer,  MD;  Location: [place] ENDOSCOPY;  Service: Endoscopy;  Laterality: N/A;   ESOPHAGOGASTRODUODENOSCOPY N/A 05/06/2016   Procedure: ESOPHAGOGASTRODUODENOSCOPY (EGD);  Surgeon: Jonathon Bellows, MD;  Location: [facility] ENDOSCOPY;  Service: Gastroenterology;  Laterality: N/A;   ESOPHAGOGASTRODUODENOSCOPY N/A 06/13/2016   Procedure: ESOPHAGOGASTRODUODENOSCOPY (EGD);   Surgeon: [name], MD;  Location: [facility] ENDOSCOPY;  Service: Endoscopy;  Laterality: N/A;   ESOPHAGOGASTRODUODENOSCOPY (EGD) WITH PROPOFOL N/A 02/29/2016   Procedure: ESOPHAGOGASTRODUODENOSCOPY (EGD) WITH PROPOFOL;  Surgeon: [name], MD;  Location: [facility] ENDOSCOPY;  Service: Endoscopy;  Laterality: N/A;   ESOPHAGOGASTRODUODENOSCOPY (EGD) WITH PROPOFOL N/A 01/15/2018   Procedure: ESOPHAGOGASTRODUODENOSCOPY (EGD) WITH PROPOFOL;  Surgeon: Daneil Dolin, MD;  Location: [facility] ENDO SUITE;  Service: Endoscopy;  Laterality: N/A;  retrieval forein body   LAPAROTOMY N/A 09/12/2015   Procedure: EXPLORATORY LAPAROTOMY;  Surgeon: Florene Glen, MD;  Location: [facility] ORS;  Service: General;  Laterality: N/A;   SIGMOIDOSCOPY N/A 09/12/2015   Procedure: Lonell Face;  Surgeon: Florene Glen, MD;  Location: [facility] ORS;  Service: General;  Laterality: N/A;   WISDOM TOOTH EXTRACTION     Social History:  reports that she quit smoking about 4 years ago. Her smoking use included cigarettes. She has a 1.50 pack-year smoking history. She has never used smokeless tobacco. She reports that she does not drink alcohol and does not use drugs. Family History:  Family History  Problem Relation Age of Onset   Depression Mother    Hypertension Mother    Sleep apnea Mother    Asthma Mother    COPD Mother    Diabetes Mother    Breast cancer Maternal Aunt        20's     HOME MEDICATIONS: Allergies as of 10/28/2020       Reactions   Betadine [povidone Iodine] Other (See Comments)   Reaction:  Unknown    Fish Allergy Hives   Iodine Rash   Shellfish-derived Products Other (See Comments)   Reaction:  Unknown         Medication List        Accurate as of October 28, 2020 11:12 AM. If you have any questions, ask your nurse or doctor.          STOP taking these medications    predniSONE 10 MG (21) Tbpk tablet Commonly known as: STERAPRED UNI-PAK 21 TAB Stopped by: Dorita Sciara, MD       TAKE  these medications    albuterol 108 (90 Base) MCG/ACT inhaler Commonly known as: VENTOLIN HFA ProAir HFA 90 mcg/actuation aerosol inhaler  Inhale 2 puffs every 4 hours by inhalation route as needed.   escitalopram 5 MG tablet Commonly known as: LEXAPRO Take 5 mg by mouth every morning.   hydrOXYzine 50 MG capsule Commonly known as: VISTARIL Take 50 mg by mouth 2 (two) times daily as needed.   levothyroxine 50 MCG tablet Commonly known as: SYNTHROID TAKE 1 TABLET BY MOUTH EACH DAY   lithium carbonate 300 MG capsule Take 1 capsule (300 mg total) by mouth 2 (two) times daily with a meal.   paliperidone 6 MG 24 hr tablet Commonly known as: INVEGA Take 6 mg by mouth every morning.   prazosin 2 MG capsule Commonly known as: MINIPRESS Take 1 capsule (2 mg total) by mouth at bedtime.          OBJECTIVE:   PHYSICAL EXAM: VS: BP 126/74   Pulse 80   Ht 5\' 6"  (1.676 m)   Wt 297 lb (  134.7 kg)   SpO2 98%   BMI 47.94 kg/m   EXAM: General: Pt appears well and is in NAD  Neck: General: Supple without adenopathy. Thyroid: Thyroid size normal.    Lungs: Clear with good BS bilat with no rales, rhonchi, or wheezes  Heart: Auscultation: RRR.  Abdomen: Normoactive bowel sounds, soft, nontender, without masses or organomegaly palpable  Extremities:  BL LE: No pretibial edema normal ROM and strength.  Mental Status: Judgment, insight: Intact Mood and affect: No depression, anxiety, or agitation     DATA REVIEWED:  Results for AMARIONA, RATHJE (MRN [id number]) as of 10/29/2020 18:24  Ref. Range 10/28/2020 11:32  Prolactin Latest Units: ng/mL 38.5 (H)  TSH Latest Ref Range: 0.35 - 4.50 uIU/mL 4.07    MRI brain 09/09/2019 Scattered foci of T2 hyperintensity are seen within the white matter of the cerebral hemispheres predominantly deep and juxtacortical white matter. No posterior fossa lesion identified. None of the lesions show enhancement after the intravenous  administration of contrast.   Pituitary/Sella: The pituitary gland is normal in appearance and size for age without mass lesion. Mildly heterogeneous enhancement in the dynamic phase with homogeneous enhancement on the delayed is within normal limits. A normal posterior pituitary bright spot is seen. The infundibulum is midline. The hypothalamus and mamillary bodies are normal. There is no mass effect on the optic chiasm or optic nerves. The infundibular and chiasmatic recesses are clear. Normal cavernous sinus and cavernous internal carotid artery flow voids.  ASSESSMENT / PLAN / RECOMMENDATIONS:    1.   Hypothyroidism   - Pt is clinically and biochemically euthyroid   - No local neck symptoms   - TSH on upper end of normal, will increase Levothyroxine as below    Medication Stop Levothyroxine 50 MCG daily Start Levothyroxine 75 mcg daily    2.Hyperprolactinemia:     -Patient with chronic long-term history of intermittent hyperprolactinemia.This is attributed to multiple antipsychotic medications that have she has been on and off since her teenage years, that are known to cause hyperprolactinemia such as risperidone and paliperidone Lorayne Bender ) -Repeat prolactin levels have been normal until this time with slight elevation but since she has no galactorrhea and menstruations regular, will hold off on treatment and recheck in 6 weeks - MRI did NOT show any evidence of pituitary adenoma      3. Weight gain :   Counseled about avoiding sugar sweetened beverages , avoiding snacks if possible and trying to stay active   F/U in 1 yr  Labs in 6 weeks     Signed electronically by: Mack Guise, MD  [facility]  [address] [place], [place] [address] Phone: [phone] FAX: [phone]      CC: Casilda Carls, [place] [address] Phone: [phone]  Fax: [phone]   Return to  Endocrinology clinic as below: Future Appointments  Date Time Provider [place]  12/01/2020  3:00 PM Glean Hess, MD [facility]  08/25/2021 10:00 AM Philip Aspen, CNM [facility] None

## 2020-10-28 ENCOUNTER — Ambulatory Visit (INDEPENDENT_AMBULATORY_CARE_PROVIDER_SITE_OTHER): Payer: Medicare Other | Admitting: Internal Medicine

## 2020-10-28 ENCOUNTER — Encounter: Payer: Self-pay | Admitting: Internal Medicine

## 2020-10-28 ENCOUNTER — Other Ambulatory Visit: Payer: Self-pay

## 2020-10-28 VITALS — BP 126/74 | HR 80 | Ht 66.0 in | Wt 297.0 lb

## 2020-10-28 DIAGNOSIS — Z8639 Personal history of other endocrine, nutritional and metabolic disease: Secondary | ICD-10-CM

## 2020-10-28 DIAGNOSIS — R635 Abnormal weight gain: Secondary | ICD-10-CM

## 2020-10-28 DIAGNOSIS — E039 Hypothyroidism, unspecified: Secondary | ICD-10-CM | POA: Diagnosis not present

## 2020-10-28 LAB — TSH: TSH: 4.07 u[IU]/mL (ref 0.35–4.50)

## 2020-10-28 NOTE — Patient Instructions (Signed)

## 2020-10-29 LAB — PROLACTIN: Prolactin: 38.5 ng/mL — ABNORMAL HIGH

## 2020-10-29 MED ORDER — LEVOTHYROXINE SODIUM 75 MCG PO TABS
75.0000 ug | ORAL_TABLET | Freq: Every day | ORAL | 3 refills | Status: DC
Start: 2020-10-29 — End: 2021-06-28

## 2020-11-05 ENCOUNTER — Ambulatory Visit (INDEPENDENT_AMBULATORY_CARE_PROVIDER_SITE_OTHER): Payer: Medicare Other

## 2020-11-05 ENCOUNTER — Other Ambulatory Visit: Payer: Self-pay

## 2020-11-05 ENCOUNTER — Ambulatory Visit
Admission: EM | Admit: 2020-11-05 | Discharge: 2020-11-05 | Disposition: A | Discharge status: Home / Self Care | Payer: Medicare Other | Attending: Emergency Medicine | Admitting: Emergency Medicine

## 2020-11-05 DIAGNOSIS — M25561 Pain in right knee: Secondary | ICD-10-CM

## 2020-11-05 IMAGING — CR DG KNEE COMPLETE 4+V*R*
5 series · 5 of 5 positions shown · non-contrast
Comparison: [DATE].

CLINICAL DATA: Right knee injury.

EXAM:
RIGHT KNEE - COMPLETE 4+ VIEW

[knee ap (1 of 2)]
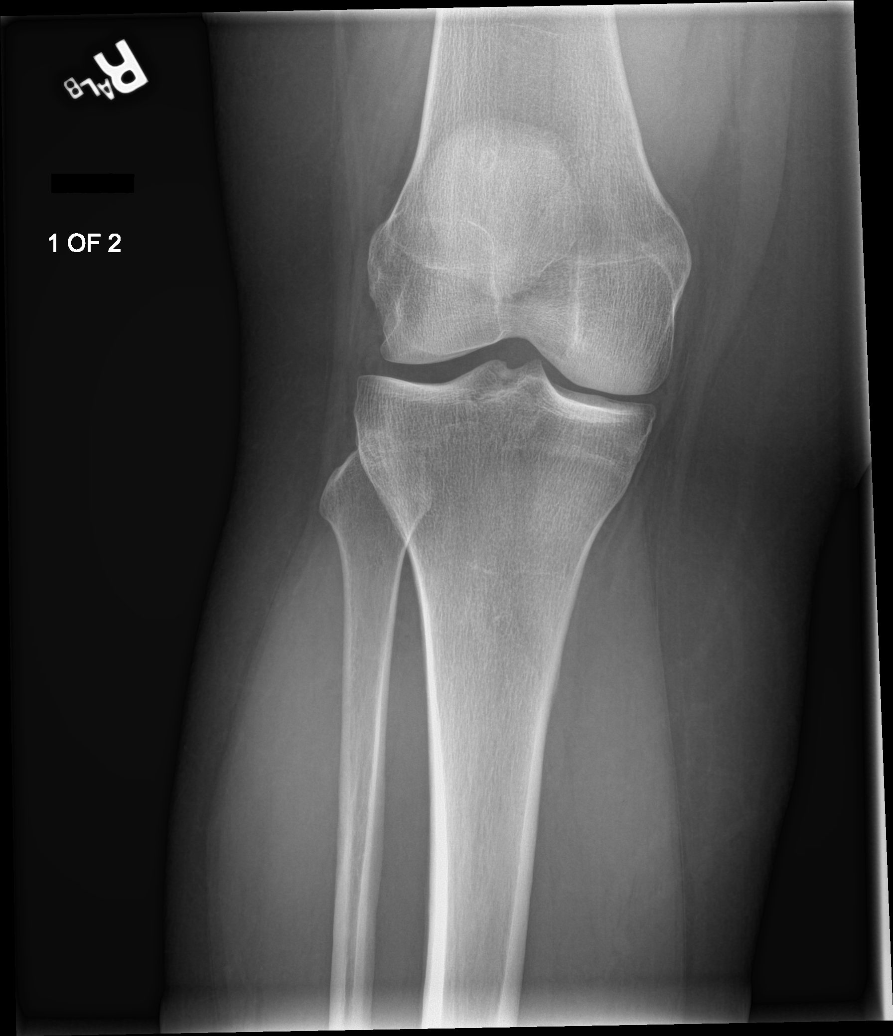

[knee lat]
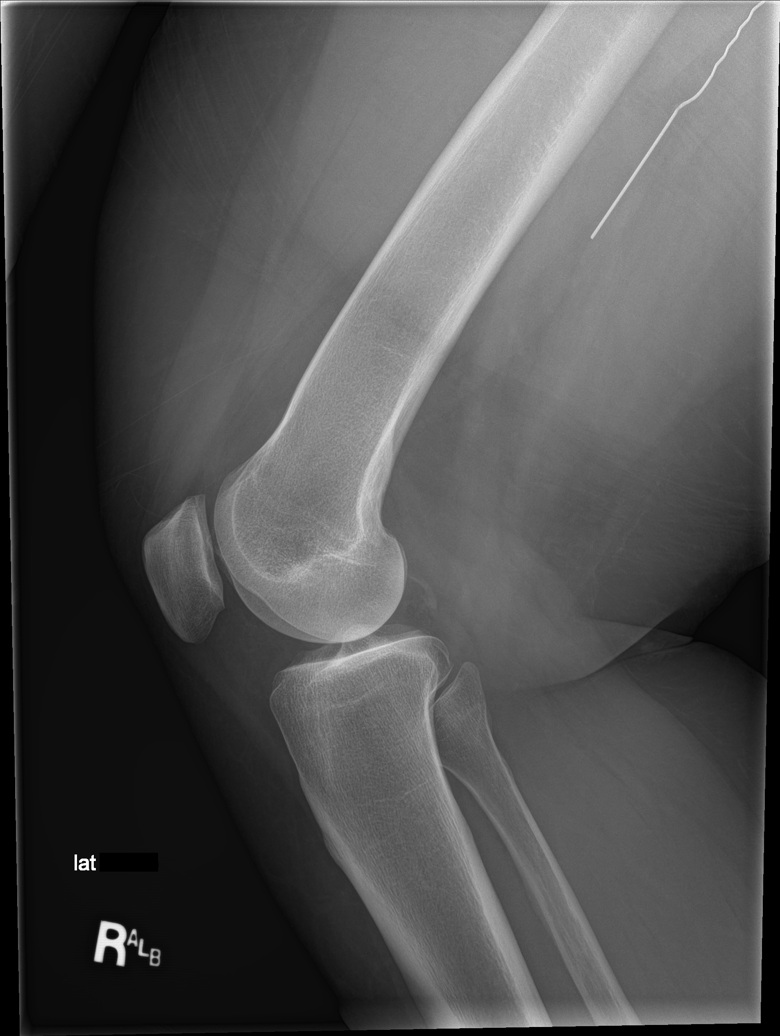

[tunnel]
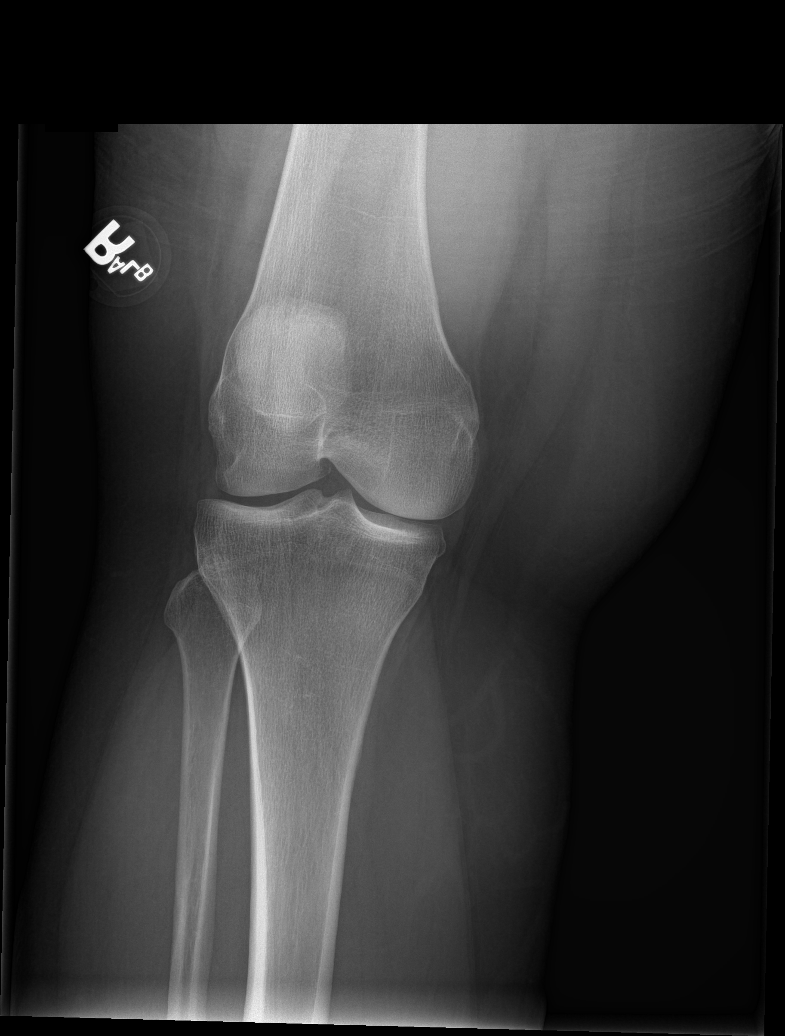

[patella skyline]
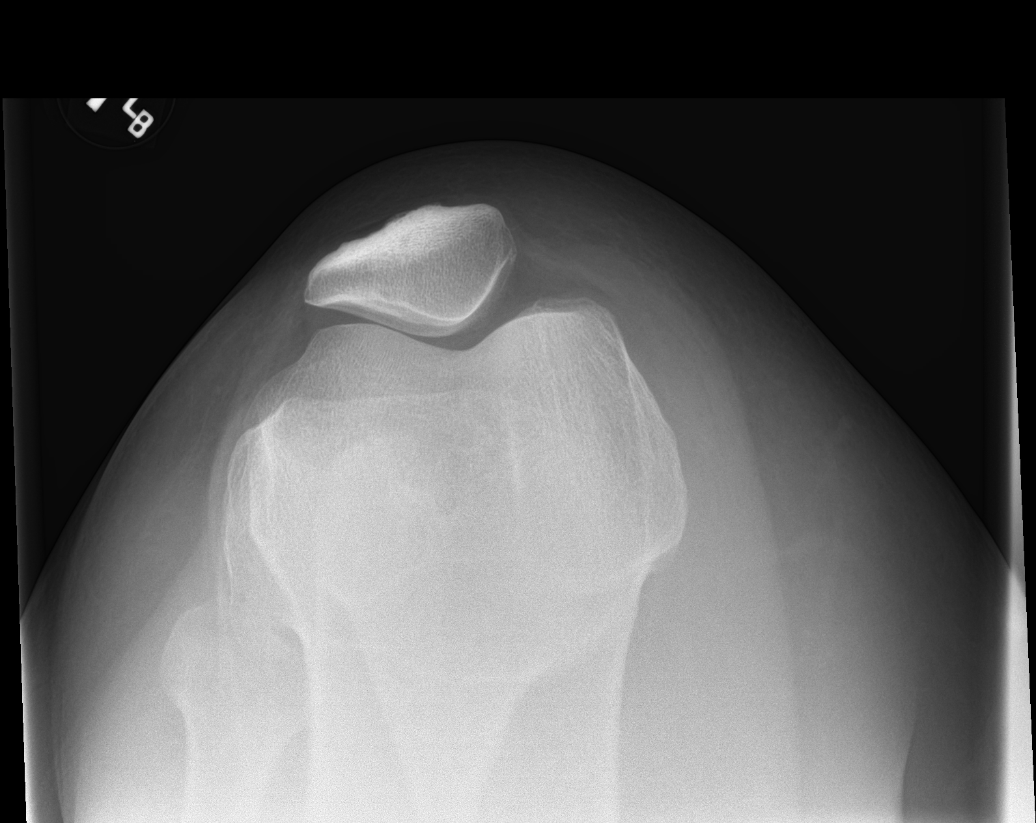

[knee ap (2 of 2)]
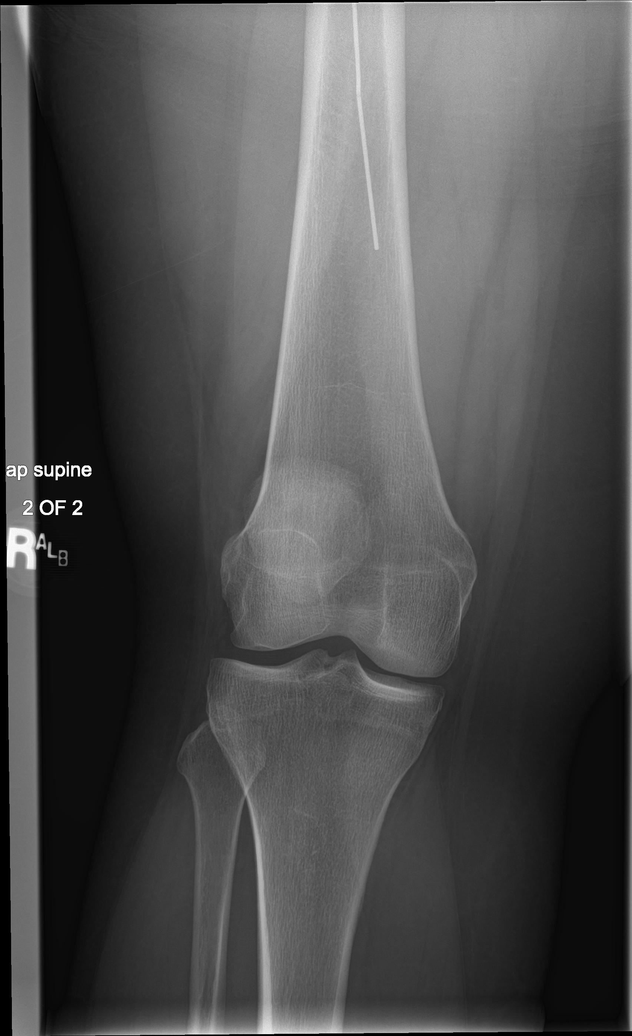

[5 of 5 positions shown; findings below may reference images not displayed]

FINDINGS: Tiny metallic density noted over the posterior soft tissues of the
left thigh. No acute bony or joint abnormality. No evidence of
fracture or dislocation.
IMPRESSION: Tiny metallic density noted the posterior soft tissues of the left
thigh. No acute bony abnormality identified.

## 2020-11-05 NOTE — Discharge Instructions (Addendum)
Follow up with PCP to inform of visit and treatment in clinic today as orthopedic or PT referral may be needed if symptoms persist longer than 2-3 weeks.  Increase fluid intake. Apply ice to affected area 3-5 times daily for 15-20 minute intervals. Take all medications as prescribed. May take 1000 mg Tylenol  OR 600 mg ibuprofen every 8 hours as needed for pain as long as neither medication is contraindicated to your current health conditions. Pillow underneath knees at night to sleep on your back and/or pillow in between knees to sleep on side.  Return to clinic if symptoms worsen. If you experience shortness of breath, chest pain, dizziness, fainting or severe headache go to the ER.

## 2020-11-05 NOTE — ED Provider Notes (Signed)
Chief Complaint   Chief Complaint  Patient presents with   Knee Pain     Subjective, HPI  Melanie Bell is a 30 y.o. female who presents with right knee pain after falling 2 days ago.  Patient states that she has a history of issues with this knee.  Patient states that her knee problems to the right knee began about 2 years ago.  Patient states that she has had 2 episodes where her knee has given out on her.  Once 2 days ago and then again today at about 1 AM.  Patient states that she fell and hit the right knee on both occasions.  Patient states that she was informed that she has some degenerative changes to the right knee.  Patient states that she has an appointment scheduled with a PCP to establish care next month.  She does not report any additional symptoms or concerns today.  History obtained from patient.  Patient's problem list, past medical and social history, medications, and allergies were reviewed by me and updated in Epic.    ROS  See HPI.  Objective   Vitals:   11/05/20 1212  BP: (!) 151/88  Pulse: 89  Resp: 18  Temp: 98.2 F (36.8 C)  SpO2: 95%    Vital signs and nursing note reviewed.   General: Appears well-developed and well-nourished. No acute distress.  Head: Normocephalic and atraumatic.   Neck: Normal range of motion, neck is supple.  Cardiovascular: Normal rate. Pulm/Chest: No respiratory distress.  Musculoskeletal: Right knee: Diffuse TTP noted about entire right knee (patella, medial and lateral joint lines).  No evidence of trauma, there is mild swelling noted about the right knee.  5/5 strength, full sensation, 2+  pulses, < 2 sec cap refill.  Neurological: Alert and oriented to person, place, and time.  Skin: Skin is warm and dry.   Psychiatric: Normal mood, affect, behavior, and thought content.    Data  No results found for any visits on 11/05/20.   Imaging Right knee: On my read, no fracture or dislocation. Pending final  interpretation.   Assessment & Plan    30 y.o. female presents with right knee pain after falling 2 days ago.  Patient states that she has a history of issues with this knee.  Patient states that her knee problems to the right knee began about 2 years ago.  Patient states that she has had 2 episodes where her knee has given out on her.  Once 2 days ago and then again today at about 1 AM.  Patient states that she fell and hit the right knee on both occasions.  Patient states that she was informed that she has some degenerative changes to the right knee.  Patient states that she has an appointment scheduled with a PCP to establish care next month.  She does not report any additional symptoms or concerns today.  Chart review completed.  Right knee: On my read, no fracture or dislocation.  Pending final interpretation.  Advised about home treatment and care for right knee pain and advised to follow-up with PCP as directed.  Patient verbalized understanding and agreed with plan.  Stable on discharge.  The patient does have access to a right knee immobilizer for which she presented to clinic wearing, advised to continue using this device.  Plan:   Discharge Instructions      Follow up with PCP to inform of visit and treatment in clinic today as orthopedic or PT  referral may be needed if symptoms persist longer than 2-3 weeks.  Increase fluid intake. Apply ice to affected area 3-5 times daily for 15-20 minute intervals. Take all medications as prescribed. May take 1000 mg Tylenol  OR 600 mg ibuprofen every 8 hours as needed for pain as long as neither medication is contraindicated to your current health conditions. Pillow underneath knees at night to sleep on your back and/or pillow in between knees to sleep on side.  Return to clinic if symptoms worsen. If you experience shortness of breath, chest pain, dizziness, fainting or severe headache go to the ER.         Serafina Royals,  FNP-C 11/05/20   This note was partially made with the aid of speech-to-text dictation; typographical errors are not intentional.    Serafina Royals, [place] 11/05/20 1316

## 2020-11-05 NOTE — ED Triage Notes (Signed)
Pt reports having R knee pain s/p fall 2 days ago.

## 2020-11-30 ENCOUNTER — Encounter: Payer: Self-pay | Admitting: Internal Medicine

## 2020-12-01 ENCOUNTER — Encounter: Payer: Self-pay | Admitting: Internal Medicine

## 2020-12-01 ENCOUNTER — Other Ambulatory Visit: Payer: Self-pay

## 2020-12-01 ENCOUNTER — Ambulatory Visit (INDEPENDENT_AMBULATORY_CARE_PROVIDER_SITE_OTHER): Payer: Medicare Other | Admitting: Internal Medicine

## 2020-12-01 VITALS — BP 134/80 | HR 88 | Ht 66.0 in | Wt 296.6 lb

## 2020-12-01 DIAGNOSIS — F251 Schizoaffective disorder, depressive type: Secondary | ICD-10-CM | POA: Diagnosis not present

## 2020-12-01 DIAGNOSIS — J4520 Mild intermittent asthma, uncomplicated: Secondary | ICD-10-CM | POA: Insufficient documentation

## 2020-12-01 DIAGNOSIS — Z3200 Encounter for pregnancy test, result unknown: Secondary | ICD-10-CM

## 2020-12-01 DIAGNOSIS — K5900 Constipation, unspecified: Secondary | ICD-10-CM

## 2020-12-01 DIAGNOSIS — E221 Hyperprolactinemia: Secondary | ICD-10-CM | POA: Diagnosis not present

## 2020-12-01 DIAGNOSIS — E162 Hypoglycemia, unspecified: Secondary | ICD-10-CM

## 2020-12-01 DIAGNOSIS — K219 Gastro-esophageal reflux disease without esophagitis: Secondary | ICD-10-CM | POA: Insufficient documentation

## 2020-12-01 LAB — POCT URINE PREGNANCY: Preg Test, Ur: NEGATIVE

## 2020-12-01 MED ORDER — OMEPRAZOLE 20 MG PO CPDR: 20.0000 mg | DELAYED_RELEASE_CAPSULE | Freq: Every day | ORAL | 1 refills | Status: DC

## 2020-12-01 MED ORDER — ALBUTEROL SULFATE HFA 108 (90 BASE) MCG/ACT IN AERS: INHALATION_SPRAY | RESPIRATORY_TRACT | 1 refills | Status: DC

## 2020-12-01 NOTE — Progress Notes (Signed)
Date:  12/01/2020   Name:  Melanie Bell   DOB:  [birth year]   MRN:  [id number]   Chief Complaint: Establish Care, Possible Pregnancy (Wants pregnancy test. Last period was light and not normal. ), and Hypoglycemia (Having Blood Sugar Drops into the 40s. Orange Juice with Honey and peanut butter. There are times when her fiance finds her in the floor from the feelings of low blood sugar. Been happening at most 8 months. Her sister is hypoglycemic. )  Possible Pregnancy This is a new problem. The current episode started 1 to 4 weeks ago. The problem has been unchanged (very light brief menstrual cycle last month). Associated symptoms include weakness (and syncope). Pertinent negatives include no abdominal pain, arthralgias, chest pain, chills, coughing, fatigue, fever or joint swelling. Nothing aggravates the symptoms. Treatments tried: not currently using any contraception and hoping for a pregnancy.  Hypoglycemia This is a recurrent problem. The problem has been unchanged. Associated symptoms include weakness (and syncope). Pertinent negatives include no abdominal pain, arthralgias, chest pain, chills, coughing, fatigue, fever or joint swelling. Nothing aggravates the symptoms. She has tried eating (after events but has not changed her overall diet or eating habits) for the symptoms. The treatment provided significant relief.  Gastroesophageal Reflux She complains of heartburn and wheezing (mild intermittent sx). She reports no abdominal pain, no chest pain or no coughing. This is a recurrent problem. The problem occurs frequently. The heartburn duration is less than a minute. The heartburn is located in the substernum. The heartburn is of moderate intensity. The heartburn wakes her from sleep. The heartburn does not limit her activity. The heartburn doesn't change with position. Nothing aggravates the symptoms. Pertinent negatives include no fatigue. She has tried a PPI (was on omeprazole  several years ago but this was discontinued when sx resolved.) for the symptoms.  Constipation This is a chronic problem. The problem is unchanged. Pertinent negatives include no abdominal pain, diarrhea or fever. She has tried stool softeners for the symptoms.  Schizoaffective disorder;bipolar - treated and followed by Psych.  She is on several medications and is doing well.  She has a hx of ingesting foreign objects but feels that she is much better controlled now with no compulsions in that area.  She has had numerous EGDs - no hx of ulcers or gastritis. Elevated prolactin/galactorrhea - evaluated by Endocrinology in the past and felt to be related to her thyroid disease.  Hyperthyroidism - treated with Methimazole.  Lab Results  Component Value Date   CREATININE 0.71 04/24/2020   BUN 17 04/24/2020   NA 139 04/24/2020   K 4.0 04/24/2020   CL 104 04/24/2020   CO2 27 04/24/2020   Lab Results  Component Value Date   CHOL 142 07/01/2019   HDL 50 07/01/2019   LDLCALC 79 07/01/2019   TRIG 63 07/01/2019   CHOLHDL 2.8 07/01/2019   Lab Results  Component Value Date   TSH 4.07 10/28/2020   Lab Results  Component Value Date   HGBA1C 5.1 11/23/2017   Lab Results  Component Value Date   WBC 5.9 02/09/2018   HGB 12.3 02/09/2018   HCT 36.0 02/09/2018   MCV 91.6 02/09/2018   PLT 205 02/09/2018   Lab Results  Component Value Date   ALT 7 07/31/2019   AST 16 07/31/2019   ALKPHOS 74 07/31/2019   BILITOT 0.2 07/31/2019     Review of Systems  Constitutional:  Negative for chills, fatigue and fever. Unexpected  weight change: 35 lbs over the past few months. Eyes:  Negative for visual disturbance.  Respiratory:  Positive for wheezing (mild intermittent sx). Negative for cough, chest tightness and shortness of breath.   Cardiovascular:  Negative for chest pain, palpitations and leg swelling.  Gastrointestinal:  Positive for constipation and heartburn. Negative for abdominal pain and  diarrhea.       Worsening gerd   Genitourinary:  Negative for dysuria and hematuria.  Musculoskeletal:  Negative for arthralgias and joint swelling.  Neurological:  Positive for weakness (and syncope). Negative for dizziness.  Psychiatric/Behavioral:  Negative for dysphoric mood and sleep disturbance. The patient is not nervous/anxious.    Patient Active Problem List   Diagnosis Date Noted   History of hypoglycemia 04/26/2020   History of hyperprolactinemia 04/26/2020   Rheumatoid factor positive 12/09/2019   SS-A antibody positive 10/28/2019   Chronic pain of both knees 10/28/2019   Subclinical hypothyroidism 08/02/2019   Hyperprolactinemia ([place]) 08/02/2019   Schizoaffective disorder, depressive type ([place]) 02/02/2018   Suicide attempt ([place]) 01/16/2018   Dysfunctional uterine bleeding 01/06/2018   Borderline personality disorder ([place]) 01/04/2018   PTSD (post-traumatic stress disorder) 01/03/2018   ASCUS of cervix with negative high risk HPV 08/28/2017   Malingering 05/06/2016   Breast tumor 03/05/2016   Schizoaffective disorder, bipolar type ([place]) 11/06/2014   Hypertension 09/29/2014    Allergies  Allergen Reactions   Betadine [Povidone Iodine] Other (See Comments)    Reaction:  Unknown    Fish Allergy Hives   Iodine Rash   Shellfish-Derived Products Other (See Comments)    Reaction:  Unknown     Past Surgical History:  Procedure Laterality Date   ABDOMINAL SURGERY     "years ago" to remove foreign objects   APPENDECTOMY     BREAST EXCISIONAL BIOPSY Right 09/03/2007   surgical bx removed fibroadeoma   BREAST LUMPECTOMY Right    COLONOSCOPY WITH PROPOFOL N/A 09/10/2015   Procedure: COLONOSCOPY WITH PROPOFOL;  Surgeon: [name], MD;  Location: [facility] ENDOSCOPY;  Service: Endoscopy;  Laterality: N/A;   ESOPHAGOGASTRODUODENOSCOPY N/A 11/28/2014   Procedure: ESOPHAGOGASTRODUODENOSCOPY (EGD);  Surgeon: [name], MD;  Location: [facility] ENDOSCOPY;  Service:  Endoscopy;  Laterality: N/A;   ESOPHAGOGASTRODUODENOSCOPY N/A 02/21/2016   Procedure: ESOPHAGOGASTRODUODENOSCOPY (EGD);  Surgeon: Mauri Pole, MD;  Location: [facility] ENDOSCOPY;  Service: Endoscopy;  Laterality: N/A;   ESOPHAGOGASTRODUODENOSCOPY N/A 04/21/2016   Procedure: ESOPHAGOGASTRODUODENOSCOPY (EGD);  Surgeon: Gatha Mayer, MD;  Location: [facility] ENDOSCOPY;  Service: Endoscopy;  Laterality: N/A;   ESOPHAGOGASTRODUODENOSCOPY N/A 05/06/2016   Procedure: ESOPHAGOGASTRODUODENOSCOPY (EGD);  Surgeon: Jonathon Bellows, MD;  Location: [facility] ENDOSCOPY;  Service: Gastroenterology;  Laterality: N/A;   ESOPHAGOGASTRODUODENOSCOPY N/A 06/13/2016   Procedure: ESOPHAGOGASTRODUODENOSCOPY (EGD);  Surgeon: [name], MD;  Location[facility] ENDOSCOPY;  Service: Endoscopy;  Laterality: N/A;   ESOPHAGOGASTRODUODENOSCOPY (EGD) WITH PROPOFOL N/A 02/29/2016   Procedure: ESOPHAGOGASTRODUODENOSCOPY (EGD) WITH PROPOFOL;  Surgeon: [name], MD;  Location: [facility] ENDOSCOPY;  Service: Endoscopy;  Laterality: N/A;   ESOPHAGOGASTRODUODENOSCOPY (EGD) WITH PROPOFOL N/A 01/15/2018   Procedure: ESOPHAGOGASTRODUODENOSCOPY (EGD) WITH PROPOFOL;  Surgeon: Daneil Dolin, MD;  Location: [facility] ENDO SUITE;  Service: Endoscopy;  Laterality: N/A;  retrieval forein body   LAPAROTOMY N/A 09/12/2015   Procedure: EXPLORATORY LAPAROTOMY;  Surgeon: Florene Glen, MD;  Location: [facility] ORS;  Service: General;  Laterality: N/A;   SIGMOIDOSCOPY N/A 09/12/2015   Procedure: Lonell Face;  Surgeon: Florene Glen, MD;  Location: [facility] ORS;  Service: General;  Laterality: N/A;  WISDOM TOOTH EXTRACTION      Social History   Tobacco Use   Smoking status: Former    Packs/day: 0.50    Years: 3.00    Pack years: 1.50    Types: Cigarettes    Quit date: 08/2016    Years since quitting: 4.3   Smokeless tobacco: Never  Vaping Use   Vaping Use: Never used  Substance Use Topics   Alcohol use: No   Drug use: No     Medication list has been reviewed and  updated.  Current Meds  Medication Sig   albuterol (VENTOLIN HFA) 108 (90 Base) MCG/ACT inhaler ProAir HFA 90 mcg/actuation aerosol inhaler  Inhale 2 puffs every 4 hours by inhalation route as needed.   escitalopram (LEXAPRO) 5 MG tablet Take 5 mg by mouth every morning.   hydrOXYzine (VISTARIL) 50 MG capsule Take 50 mg by mouth 2 (two) times daily as needed.   levothyroxine (SYNTHROID) 75 MCG tablet Take 1 tablet (75 mcg total) by mouth daily.   lithium carbonate 300 MG capsule Take 1 capsule (300 mg total) by mouth 2 (two) times daily with a meal.   paliperidone (INVEGA) 6 MG 24 hr tablet Take 6 mg by mouth every morning.   prazosin (MINIPRESS) 2 MG capsule Take 1 capsule (2 mg total) by mouth at bedtime.    PHQ 2/9 Scores 12/01/2020  PHQ - 2 Score 0  PHQ- 9 Score 0    GAD 7 : Generalized Anxiety Score 12/01/2020  Nervous, Anxious, on Edge 0  Control/stop worrying 0  Worry too much - different things 0  Trouble relaxing 0  Restless 0  Easily annoyed or irritable 0  Afraid - awful might happen 0  Total GAD 7 Score 0  Anxiety Difficulty Not difficult at all    BP Readings from Last 3 Encounters:  12/01/20 134/80  11/05/20 (!) 151/88  10/28/20 126/74    Physical Exam Vitals and nursing note reviewed.  Constitutional:      General: She is not in acute distress.    Appearance: She is well-developed. She is obese.  HENT:     Head: Normocephalic and atraumatic.  Neck:     Vascular: No carotid bruit.  Cardiovascular:     Rate and Rhythm: Normal rate and regular rhythm.     Pulses: Normal pulses.     Heart sounds: No murmur heard. Pulmonary:     Effort: Pulmonary effort is normal. No respiratory distress.     Breath sounds: No wheezing or rhonchi.  Musculoskeletal:     Cervical back: Normal range of motion.     Right lower leg: No edema.     Left lower leg: No edema.  Lymphadenopathy:     Cervical: No cervical adenopathy.  Skin:    General: Skin is warm and dry.      Capillary Refill: Capillary refill takes less than 2 seconds.     Findings: No rash.  Neurological:     General: No focal deficit present.     Mental Status: She is alert and oriented to person, place, and time.  Psychiatric:        Mood and Affect: Mood normal.        Behavior: Behavior normal.    Wt Readings from Last 3 Encounters:  12/01/20 296 lb 9.6 oz (134.5 kg)  11/05/20 262 lb (118.8 kg)  10/28/20 297 lb (134.7 kg)    BP 134/80 (BP Location: Right Arm, Patient Position: Sitting, Cuff  Size: Large)   Pulse 88   Ht 5\' 6"  (1.676 m)   Wt 296 lb 9.6 oz (134.5 kg)   LMP 11/10/2020 (Exact Date) Comment: Possibly Pregnant.  SpO2 99%   BMI 47.87 kg/m   Assessment and Plan: 1. Hypoglycemia Recommend to increase her protein intake and reduce her carbohydrate. Continue to monitor blood sugars  2. Possible pregnancy She has already seen GYN and had a good check up - POCT urine pregnancy  3. Schizoaffective disorder, depressive type ([place]) Continue follow up with Psych  4. Hyperprolactinemia (HCC) Chronic stable  5. Constipation, unspecified constipation type Managed with over the counter medications  6. Gastroesophageal reflux disease, unspecified whether esophagitis present Treat with omeprazole daily for 1-2 months - omeprazole (PRILOSEC) 20 MG capsule; Take 1 capsule (20 mg total) by mouth daily.  Dispense: 30 capsule; Refill: 1  7. Mild intermittent asthma without complication Continue albuterol PRN - albuterol (VENTOLIN HFA) 108 (90 Base) MCG/ACT inhaler; 2 puffs four times a day as needed  Dispense: 18 g; Refill: 1   Partially dictated using Editor, commissioning. Any errors are unintentional.  Halina Maidens, MD [facility]  12/01/2020

## 2020-12-21 ENCOUNTER — Other Ambulatory Visit: Payer: Self-pay | Admitting: Internal Medicine

## 2020-12-21 DIAGNOSIS — J4520 Mild intermittent asthma, uncomplicated: Secondary | ICD-10-CM

## 2020-12-21 NOTE — Telephone Encounter (Signed)
Patient has a refill on file as of today. This request is not needed-refused.

## 2021-01-04 ENCOUNTER — Ambulatory Visit (INDEPENDENT_AMBULATORY_CARE_PROVIDER_SITE_OTHER): Payer: Medicare Other

## 2021-01-04 DIAGNOSIS — Z0000 Encounter for general adult medical examination without abnormal findings: Secondary | ICD-10-CM | POA: Diagnosis not present

## 2021-01-04 NOTE — Progress Notes (Signed)
Subjective:   Melanie Bell is a 30 y.o. female who presents for an Initial Medicare Annual Wellness Visit.  Virtual Visit via Telephone Note  I connected with  [name] on 01/04/21 at  3:20 PM EDT by telephone and verified that I am speaking with the correct person using two identifiers.  Location: Patient: home Provider: [facility] Persons participating in the virtual visit: [place]   I discussed the limitations, risks, security and privacy concerns of performing an evaluation and management service by telephone and the availability of in person appointments. The patient expressed understanding and agreed to proceed.  Interactive audio and video telecommunications were attempted between this nurse and patient, however failed, due to patient having technical difficulties OR patient did not have access to video capability.  We continued and completed visit with audio only.  Some vital signs may be absent or patient reported.   Clemetine Marker, LPN   Review of Systems     Cardiac Risk Factors include: hypertension     Objective:    Today's Vitals   01/04/21 1538  PainSc: 7    There is no height or weight on file to calculate BMI.  Advanced Directives 01/04/2021 02/12/2020 09/19/2019 01/31/2018 01/15/2018 01/15/2018 01/14/2018  Does Patient Have a Medical Advance Directive? No No No No No No No  Would patient like information on creating a medical advance directive? No - Patient declined - - No - Patient declined No - Patient declined - -  Some encounter information is confidential and restricted. Go to Review Flowsheets activity to see all data.    Current Medications (verified) Outpatient Encounter Medications as of 01/04/2021  Medication Sig   albuterol (VENTOLIN HFA) 108 (90 Base) MCG/ACT inhaler 2 puffs four times a day as needed   escitalopram (LEXAPRO) 5 MG tablet Take 5 mg by mouth every morning.   hydrOXYzine (VISTARIL) 50 MG capsule Take 50  mg by mouth 2 (two) times daily as needed.   levothyroxine (SYNTHROID) 75 MCG tablet Take 1 tablet (75 mcg total) by mouth daily.   lithium carbonate 300 MG capsule Take 1 capsule (300 mg total) by mouth 2 (two) times daily with a meal.   meloxicam (MOBIC) 7.5 MG tablet Take 7.5 mg by mouth daily.   omeprazole (PRILOSEC) 20 MG capsule Take 1 capsule (20 mg total) by mouth daily.   paliperidone (INVEGA) 6 MG 24 hr tablet Take 6 mg by mouth every morning.   prazosin (MINIPRESS) 2 MG capsule Take 1 capsule (2 mg total) by mouth at bedtime.   No facility-administered encounter medications on file as of 01/04/2021.    Allergies (verified) Betadine [povidone iodine], Fish allergy, Iodine, and Shellfish-derived products   History: Past Medical History:  Diagnosis Date   Allergy    Anxiety    Arthritis    Asthma    Depression    Foreign body aspiration    GERD (gastroesophageal reflux disease)    Hallucinations 09/30/2014   Sizoaffective   Hyperlipidemia    Hypertension    Intentional ingestion of batteries 04/21/2016    2 AAA batteries and 1 thumb tack; intent to hurt herself/notes [id number]   Self-inflicted injury [id number]   Suicidal ideation 01/02/2018   Tardive dyskinesia 10/2014   recent onset   Thyroid disease    Past Surgical History:  Procedure Laterality Date   ABDOMINAL SURGERY     "years ago" to remove foreign objects   APPENDECTOMY     BREAST  EXCISIONAL BIOPSY Right 09/03/2007   surgical bx removed fibroadeoma   BREAST LUMPECTOMY Right    COLONOSCOPY WITH PROPOFOL N/A 09/10/2015   Procedure: COLONOSCOPY WITH PROPOFOL;  Surgeon: [name], MD;  Location: [facility] ENDOSCOPY;  Service: Endoscopy;  Laterality: N/A;   ESOPHAGOGASTRODUODENOSCOPY N/A 11/28/2014   Procedure: ESOPHAGOGASTRODUODENOSCOPY (EGD);  Surgeon: [name], MD;  Location: [facility] ENDOSCOPY;  Service: Endoscopy;  Laterality: N/A;   ESOPHAGOGASTRODUODENOSCOPY N/A 02/21/2016   Procedure:  ESOPHAGOGASTRODUODENOSCOPY (EGD);  Surgeon: Mauri Pole, MD;  Location: [facility] ENDOSCOPY;  Service: Endoscopy;  Laterality: N/A;   ESOPHAGOGASTRODUODENOSCOPY N/A 04/21/2016   Procedure: ESOPHAGOGASTRODUODENOSCOPY (EGD);  Surgeon: Gatha Mayer, MD;  Location: [facility] ENDOSCOPY;  Service: Endoscopy;  Laterality: N/A;   ESOPHAGOGASTRODUODENOSCOPY N/A 05/06/2016   Procedure: ESOPHAGOGASTRODUODENOSCOPY (EGD);  Surgeon: Jonathon Bellows, MD;  Location: [facility] ENDOSCOPY;  Service: Gastroenterology;  Laterality: N/A;   ESOPHAGOGASTRODUODENOSCOPY N/A 06/13/2016   Procedure: ESOPHAGOGASTRODUODENOSCOPY (EGD);  Surgeon: [name], MD;  Location: [facility] ENDOSCOPY;  Service: Endoscopy;  Laterality: N/A;   ESOPHAGOGASTRODUODENOSCOPY (EGD) WITH PROPOFOL N/A 02/29/2016   Procedure: ESOPHAGOGASTRODUODENOSCOPY (EGD) WITH PROPOFOL;  Surgeon: [name], MD;  Location: [facility] ENDOSCOPY;  Service: Endoscopy;  Laterality: N/A;   ESOPHAGOGASTRODUODENOSCOPY (EGD) WITH PROPOFOL N/A 01/15/2018   Procedure: ESOPHAGOGASTRODUODENOSCOPY (EGD) WITH PROPOFOL;  Surgeon: Daneil Dolin, MD;  Location: [facility] ENDO SUITE;  Service: Endoscopy;  Laterality: N/A;  retrieval forein body   LAPAROTOMY N/A 09/12/2015   Procedure: EXPLORATORY LAPAROTOMY;  Surgeon: Florene Glen, MD;  Location: [facility] ORS;  Service: General;  Laterality: N/A;   SIGMOIDOSCOPY N/A 09/12/2015   Procedure: Lonell Face;  Surgeon: Florene Glen, MD;  Location: [facility] ORS;  Service: General;  Laterality: N/A;   WISDOM TOOTH EXTRACTION     Family History  Problem Relation Age of Onset   Depression Mother    Hypertension Mother    Sleep apnea Mother    Asthma Mother    COPD Mother    Diabetes Mother    Anxiety disorder Mother    Arthritis Mother    Breast cancer Maternal Aunt        20's   Stroke Maternal Grandmother    Social History   Socioeconomic History   Marital status: Significant Other    Spouse name: Not on file   Number of children: 1   Years of  education: 12   Highest education level: Not on file  Occupational History   Occupation: unemployed  Tobacco Use   Smoking status: Former    Packs/day: 0.50    Years: 3.00    Pack years: 1.50    Types: Cigarettes    Quit date: 08/14/2016    Years since quitting: 4.3   Smokeless tobacco: Never  Vaping Use   Vaping Use: Never used  Substance and Sexual Activity   Alcohol use: No   Drug use: No   Sexual activity: Yes    Partners: Male    Birth control/protection: None  Other Topics Concern   Not on file  Social History Narrative   Right handed    One story home   Drinks occasional caffeine   Social Determinants of Health   Financial Resource Strain: Low Risk    Difficulty of Paying Living Expenses: Not very hard  Food Insecurity: No Food Insecurity   Worried About Running Out of Food in the Last Year: Never true   Ran Out of Food in the Last Year: Never true  Transportation Needs: No Transportation Needs   Lack  of Transportation (Medical): No   Lack of Transportation (Non-Medical): No  Physical Activity: Inactive   Days of Exercise per Week: 0 days   Minutes of Exercise per Session: 0 min  Stress: Not on file  Social Connections: Moderately Integrated   Frequency of Communication with Friends and Family: More than three times a week   Frequency of Social Gatherings with Friends and Family: More than three times a week   Attends Religious Services: More than 4 times per year   Active Member of [employer] or Organizations: No   Attends Music therapist: Never   Marital Status: Living with partner    Tobacco Counseling Counseling given: Not Answered   Clinical Intake:  Pre-visit preparation completed: Yes  Pain : 0-10 Pain Score: 7  Pain Type: Chronic pain Pain Location: Knee (back) Pain Orientation: Right, Left Pain Descriptors / Indicators: Aching, Sore Pain Onset: More than a month ago Pain Frequency: Constant     Nutritional Risks:  None Diabetes: No  How often do you need to have someone help you when you read instructions, pamphlets, or other written materials from your doctor or pharmacy?: 1 - Never What is the last grade level you completed in school?: GED    Interpreter Needed?: No  Information entered by :: Clemetine Marker LPN   Activities of Daily Living In your present state of health, do you have any difficulty performing the following activities: 01/04/2021 12/01/2020  Hearing? N N  Vision? N N  Difficulty concentrating or making decisions? N N  Walking or climbing stairs? Y N  Dressing or bathing? N N  Doing errands, shopping? N N  Preparing Food and eating ? N -  Using the Toilet? N -  In the past six months, have you accidently leaked urine? N -  Do you have problems with loss of bowel control? N -  Managing your Medications? N -  Managing your Finances? N -  Housekeeping or managing your Housekeeping? N -  Some recent data might be hidden    Patient Care Team: Glean Hess, MD as PCP - General (Internal Medicine) Pieter Partridge, DO as Consulting Physician (Neurology) Charlcie Cradle, MD (Psychiatry) Quintin Alto, MD as Consulting Physician (Rheumatology) [facility], Melanie Crazier, MD as Consulting Physician (Endocrinology)  Indicate any recent Medical Services you may have received from other than [facility] providers in the past year (date may be approximate).     Assessment:   This is a routine wellness examination for [place].  Hearing/Vision screen Hearing Screening - Comments:: Pt denies hearing difficulty Vision Screening - Comments:: Due for eye exam; needs to establish care with new provider in area  Dietary issues and exercise activities discussed: Current Exercise Habits: The patient does not participate in regular exercise at present, Exercise limited by: orthopedic condition(s)   Goals Addressed   None    Depression Screen PHQ 2/9 Scores 01/04/2021 12/01/2020  PHQ - 2  Score 0 0  PHQ- 9 Score 0 0    Fall Risk Fall Risk  01/04/2021 12/01/2020 02/12/2020 09/19/2019  Falls in the past year? 1 0 0 0  Number falls in past yr: 1 1 0 0  Injury with Fall? 0 1 0 0  Risk for fall due to : History of fall(s);Orthopedic patient History of fall(s);Impaired balance/gait - -  Follow up Falls prevention discussed Falls evaluation completed - -    FALL RISK PREVENTION PERTAINING TO THE HOME:  Any stairs in or around  the home? Yes  If so, are there any without handrails? No  Home free of loose throw rugs in walkways, pet beds, electrical cords, etc? Yes  Adequate lighting in your home to reduce risk of falls? Yes   ASSISTIVE DEVICES UTILIZED TO PREVENT FALLS:  Life alert? No  Use of a cane, walker or w/c? Yes  Grab bars in the bathroom? No  Shower chair or bench in shower? Yes  Elevated toilet seat or a handicapped toilet? No   TIMED UP AND GO:  Was the test performed? No . Telephonic visit.   Cognitive Function: Normal cognitive status assessed by direct observation by this Nurse Health Advisor. No abnormalities found.          Immunizations Immunization History  Administered Date(s) Administered   Influenza-Unspecified 05/31/2016   Moderna Sars-Covid-2 Vaccination 12/29/2019, 01/26/2020, 10/04/2020   PPD Test 12/05/2017   Tdap 08/21/2019    TDAP status: Up to date  Flu Vaccine status: Due, Education has been provided regarding the importance of this vaccine. Advised may receive this vaccine at local pharmacy or Health Dept. Aware to provide a copy of the vaccination record if obtained from local pharmacy or Health Dept. Verbalized acceptance and understanding.  Pneumococcal vaccine status: Due, Education has been provided regarding the importance of this vaccine. Advised may receive this vaccine at local pharmacy or Health Dept. Aware to provide a copy of the vaccination record if obtained from local pharmacy or Health Dept. Verbalized acceptance and  understanding.  Covid-19 vaccine status: Completed vaccines  Qualifies for Shingles Vaccine? No  due age 23  Screening Tests Health Maintenance  Topic Date Due   Pneumococcal Vaccine 33-63 Years old (1 - PCV) Never done   COVID-19 Vaccine (4 - Booster for Moderna series) 01/04/2021   INFLUENZA VACCINE  12/14/2020   PAP SMEAR-Modifier  08/21/2022   TETANUS/TDAP  08/20/2029   Hepatitis C Screening  Completed   HIV Screening  Completed   HPV VACCINES  Aged Out    Health Maintenance  Health Maintenance Due  Topic Date Due   Pneumococcal Vaccine 56-61 Years old (1 - PCV) Never done   COVID-19 Vaccine (4 - Booster for Moderna series) 01/04/2021   INFLUENZA VACCINE  12/14/2020    Colorectal cancer screening: Type of screening: Colonoscopy. Completed 09/10/15. Repeat every as directed years  Mammogram status: due age 78  Bone density status: due age 17  Lung Cancer Screening: (Low Dose CT Chest recommended if Age 37-80 years, 30 pack-year currently smoking OR have quit w/in 15years.) does not qualify.   Additional Screening:  Hepatitis C Screening: does qualify; Completed 08/24/20  Vision Screening: Recommended annual ophthalmology exams for early detection of glaucoma and other disorders of the eye. Is the patient up to date with their annual eye exam?  No Who is the provider or what is the name of the office in which the patient attends annual eye exams? Not established If pt is not established with a provider, would they like to be referred to a provider to establish care? No . Pt to inquire for eye provider accepting new Medicaid patients.   Dental Screening: Recommended annual dental exams for proper oral hygiene  Community Resource Referral / Chronic Care Management: CRR required this visit?  No   CCM required this visit?  No      Plan:     I have personally reviewed and noted the following in the patient's chart:   Medical and social history Use  of alcohol,  tobacco or illicit drugs  Current medications and supplements including opioid prescriptions. Patient is not currently taking opioid prescriptions. Functional ability and status Nutritional status Physical activity Advanced directives List of other physicians Hospitalizations, surgeries, and ER visits in previous 12 months Vitals Screenings to include cognitive, depression, and falls Referrals and appointments  In addition, I have reviewed and discussed with patient certain preventive protocols, quality metrics, and best practice recommendations. A written personalized care plan for preventive services as well as general preventive health recommendations were provided to patient.     Clemetine Marker, LPN   [id number]   Nurse Notes: none

## 2021-01-04 NOTE — Patient Instructions (Signed)
Melanie Bell , Thank you for taking time to come for your Medicare Wellness Visit. I appreciate your ongoing commitment to your health goals. Please review the following plan we discussed and let me know if I can assist you in the future.   Screening recommendations/referrals: Colonoscopy: done 09/10/15; due age 30 Mammogram: due age 39 Bone Density: due age 64 Recommended yearly ophthalmology/optometry visit for glaucoma screening and checkup Recommended yearly dental visit for hygiene and checkup  Vaccinations: Influenza vaccine: due 01/14/21 Pneumococcal vaccine: due for Prevnar20 Tdap vaccine: done 08/21/19 Shingles vaccine: due age 30  Covid-19: done 12/29/19, 01/26/20 & 10/04/20  Advanced directives: Advance directive discussed with you today. Even though you declined this today please call our office should you change your mind and we can give you the proper paperwork for you to fill out.   Conditions/risks identified: Recommend fall prevention in the home  Next appointment: Follow up in one year for your annual wellness visit.   Preventive Care 30-64 Years, Female Preventive care refers to lifestyle choices and visits with your health care provider that can promote health and wellness. What does preventive care include? A yearly physical exam. This is also called an annual well check. Dental exams once or twice a year. Routine eye exams. Ask your health care provider how often you should have your eyes checked. Personal lifestyle choices, including: Daily care of your teeth and gums. Regular physical activity. Eating a healthy diet. Avoiding tobacco and drug use. Limiting alcohol use. Practicing safe sex. Taking low-dose aspirin daily starting at age 31. Taking vitamin and mineral supplements as recommended by your health care provider. What happens during an annual well check? The services and screenings done by your health care provider during your annual well check will  depend on your age, overall health, lifestyle risk factors, and family history of disease. Counseling  Your health care provider may ask you questions about your: Alcohol use. Tobacco use. Drug use. Emotional well-being. Home and relationship well-being. Sexual activity. Eating habits. Work and work environment. Method of birth control. Menstrual cycle. Pregnancy history. Screening  You may have the following tests or measurements: Height, weight, and BMI. Blood pressure. Lipid and cholesterol levels. These may be checked every 5 years, or more frequently if you are over 31 years old. Skin check. Lung cancer screening. You may have this screening every year starting at age 55 if you have a 30-pack-year history of smoking and currently smoke or have quit within the past 15 years. Fecal occult blood test (FOBT) of the stool. You may have this test every year starting at age 31. Flexible sigmoidoscopy or colonoscopy. You may have a sigmoidoscopy every 5 years or a colonoscopy every 10 years starting at age 31. Hepatitis C blood test. Hepatitis B blood test. Sexually transmitted disease (STD) testing. Diabetes screening. This is done by checking your blood sugar (glucose) after you have not eaten for a while (fasting). You may have this done every 1-3 years. Mammogram. This may be done every 1-2 years. Talk to your health care provider about when you should start having regular mammograms. This may depend on whether you have a family history of breast cancer. BRCA-related cancer screening. This may be done if you have a family history of breast, ovarian, tubal, or peritoneal cancers. Pelvic exam and Pap test. This may be done every 3 years starting at age 21. Starting at age 30, this may be done every 5 years if you have a Pap   test in combination with an HPV test. Bone density scan. This is done to screen for osteoporosis. You may have this scan if you are at high risk for  osteoporosis. Discuss your test results, treatment options, and if necessary, the need for more tests with your health care provider. Vaccines  Your health care provider may recommend certain vaccines, such as: Influenza vaccine. This is recommended every year. Tetanus, diphtheria, and acellular pertussis (Tdap, Td) vaccine. You may need a Td booster every 10 years. Zoster vaccine. You may need this after age 60. Pneumococcal 13-valent conjugate (PCV13) vaccine. You may need this if you have certain conditions and were not previously vaccinated. Pneumococcal polysaccharide (PPSV23) vaccine. You may need one or two doses if you smoke cigarettes or if you have certain conditions. Talk to your health care provider about which screenings and vaccines you need and how often you need them. This information is not intended to replace advice given to you by your health care provider. Make sure you discuss any questions you have with your health care provider. Document Released: 05/29/2015 Document Revised: 01/20/2016 Document Reviewed: 03/03/2015 Elsevier Interactive Patient Education  2017 Elsevier Inc.    Fall Prevention in the Home Falls can cause injuries. They can happen to people of all ages. There are many things you can do to make your home safe and to help prevent falls. What can I do on the outside of my home? Regularly fix the edges of walkways and driveways and fix any cracks. Remove anything that might make you trip as you walk through a door, such as a raised step or threshold. Trim any bushes or trees on the path to your home. Use bright outdoor lighting. Clear any walking paths of anything that might make someone trip, such as rocks or tools. Regularly check to see if handrails are loose or broken. Make sure that both sides of any steps have handrails. Any raised decks and porches should have guardrails on the edges. Have any leaves, snow, or ice cleared regularly. Use sand or  salt on walking paths during winter. Clean up any spills in your garage right away. This includes oil or grease spills. What can I do in the bathroom? Use night lights. Install grab bars by the toilet and in the tub and shower. Do not use towel bars as grab bars. Use non-skid mats or decals in the tub or shower. If you need to sit down in the shower, use a plastic, non-slip stool. Keep the floor dry. Clean up any water that spills on the floor as soon as it happens. Remove soap buildup in the tub or shower regularly. Attach bath mats securely with double-sided non-slip rug tape. Do not have throw rugs and other things on the floor that can make you trip. What can I do in the bedroom? Use night lights. Make sure that you have a light by your bed that is easy to reach. Do not use any sheets or blankets that are too big for your bed. They should not hang down onto the floor. Have a firm chair that has side arms. You can use this for support while you get dressed. Do not have throw rugs and other things on the floor that can make you trip. What can I do in the kitchen? Clean up any spills right away. Avoid walking on wet floors. Keep items that you use a lot in easy-to-reach places. If you need to reach something above you, use a strong   step stool that has a grab bar. Keep electrical cords out of the way. Do not use floor polish or wax that makes floors slippery. If you must use wax, use non-skid floor wax. Do not have throw rugs and other things on the floor that can make you trip. What can I do with my stairs? Do not leave any items on the stairs. Make sure that there are handrails on both sides of the stairs and use them. Fix handrails that are broken or loose. Make sure that handrails are as long as the stairways. Check any carpeting to make sure that it is firmly attached to the stairs. Fix any carpet that is loose or worn. Avoid having throw rugs at the top or bottom of the stairs. If  you do have throw rugs, attach them to the floor with carpet tape. Make sure that you have a light switch at the top of the stairs and the bottom of the stairs. If you do not have them, ask someone to add them for you. What else can I do to help prevent falls? Wear shoes that: Do not have high heels. Have rubber bottoms. Are comfortable and fit you well. Are closed at the toe. Do not wear sandals. If you use a stepladder: Make sure that it is fully opened. Do not climb a closed stepladder. Make sure that both sides of the stepladder are locked into place. Ask someone to hold it for you, if possible. Clearly mark and make sure that you can see: Any grab bars or handrails. First and last steps. Where the edge of each step is. Use tools that help you move around (mobility aids) if they are needed. These include: Canes. Walkers. Scooters. Crutches. Turn on the lights when you go into a dark area. Replace any light bulbs as soon as they burn out. Set up your furniture so you have a clear path. Avoid moving your furniture around. If any of your floors are uneven, fix them. If there are any pets around you, be aware of where they are. Review your medicines with your doctor. Some medicines can make you feel dizzy. This can increase your chance of falling. Ask your doctor what other things that you can do to help prevent falls. This information is not intended to replace advice given to you by your health care provider. Make sure you discuss any questions you have with your health care provider. Document Released: 02/26/2009 Document Revised: 10/08/2015 Document Reviewed: 06/06/2014 Elsevier Interactive Patient Education  2017 Elsevier Inc.  

## 2021-01-19 ENCOUNTER — Other Ambulatory Visit: Payer: Self-pay | Admitting: Sports Medicine

## 2021-01-19 DIAGNOSIS — M5441 Lumbago with sciatica, right side: Secondary | ICD-10-CM

## 2021-01-19 DIAGNOSIS — G8929 Other chronic pain: Secondary | ICD-10-CM

## 2021-01-19 DIAGNOSIS — R29898 Other symptoms and signs involving the musculoskeletal system: Secondary | ICD-10-CM

## 2021-01-20 ENCOUNTER — Other Ambulatory Visit: Payer: Self-pay | Admitting: Internal Medicine

## 2021-01-20 DIAGNOSIS — K219 Gastro-esophageal reflux disease without esophagitis: Secondary | ICD-10-CM

## 2021-01-20 NOTE — Telephone Encounter (Signed)
Requested medication (s) are due for refill today: yes  Requested medication (s) are on the active medication list: {yes  Last refill: 12/01/20 #30  1 refill  Future visit scheduled yes  03/05/21  Notes to clinic   LAst OV noted med for 1-2 months. Please review  Requested Prescriptions  Pending Prescriptions Disp Refills   omeprazole (PRILOSEC) 20 MG capsule [Pharmacy Med Name: OMEPRAZOLE 20 MG CPDR 20 Capsule] 28 capsule 1    Sig: Take 1 capsule (20 mg total) by mouth daily.     Gastroenterology: Proton Pump Inhibitors Passed - 01/20/2021 10:30 AM      Passed - Valid encounter within last 12 months    Recent Outpatient Visits           1 month ago Hypoglycemia   [facility] Glean Hess, MD       Future Appointments             In 1 month Army Melia Jesse Sans, MD [facility], [place]   In 7 months Philip Aspen, CNM [facility]

## 2021-01-27 ENCOUNTER — Ambulatory Visit
Admission: RE | Admit: 2021-01-27 | Discharge: 2021-01-27 | Disposition: A | Discharge status: Home / Self Care | Payer: Medicare Other | Source: Home / Self Care | Attending: Sports Medicine | Admitting: Sports Medicine

## 2021-01-27 ENCOUNTER — Ambulatory Visit: Payer: Medicare Other

## 2021-01-27 ENCOUNTER — Other Ambulatory Visit: Payer: Self-pay

## 2021-01-27 ENCOUNTER — Ambulatory Visit
Admission: RE | Admit: 2021-01-27 | Discharge: 2021-01-27 | Disposition: A | Discharge status: Home / Self Care | Payer: Medicare Other | Source: Ambulatory Visit | Attending: Sports Medicine | Admitting: Sports Medicine

## 2021-01-27 ENCOUNTER — Other Ambulatory Visit: Payer: Self-pay | Admitting: Sports Medicine

## 2021-01-27 DIAGNOSIS — Z87821 Personal history of retained foreign body fully removed: Secondary | ICD-10-CM

## 2021-01-27 DIAGNOSIS — G8929 Other chronic pain: Secondary | ICD-10-CM | POA: Insufficient documentation

## 2021-01-27 DIAGNOSIS — M5441 Lumbago with sciatica, right side: Secondary | ICD-10-CM | POA: Diagnosis present

## 2021-01-27 DIAGNOSIS — M5442 Lumbago with sciatica, left side: Secondary | ICD-10-CM | POA: Diagnosis present

## 2021-01-27 DIAGNOSIS — R29898 Other symptoms and signs involving the musculoskeletal system: Secondary | ICD-10-CM | POA: Insufficient documentation

## 2021-01-27 IMAGING — MR MR LUMBAR SPINE W/O CM
4 of 5 series · 24 of 48 positions shown · non-contrast
Comparison: Lumbar radiographs [DATE] [DATE].

CLINICAL DATA: Chronic bilateral low back pain with bilateral
sciatica M54.42, M54.41, [DO] ([DO]-CM)

Complaints of leg weakness [DO] ([DO]-CM)
EXAM:
MRI LUMBAR SPINE WITHOUT CONTRAST
TECHNIQUE: Multiplanar, multisequence MR imaging of the lumbar spine was
performed. No intravenous contrast was administered.

[Series 2: T2 · sagittal · 4.0mm · 0.81mm/px · 6 of 17 slices shown (1 of 2)]
[im 1/17]
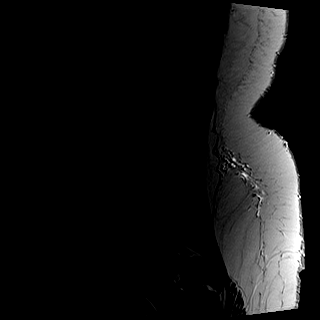
[im 4/17]
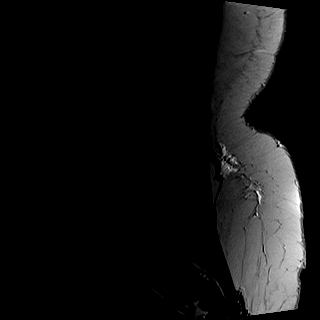
[im 7/17]
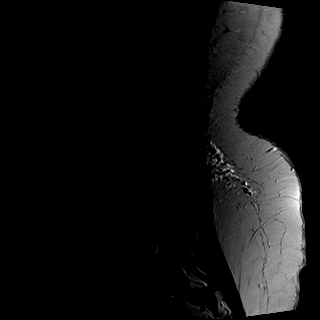
[im 10/17]
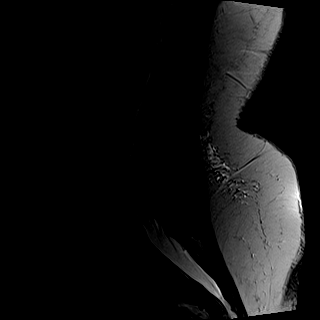
[im 13/17]
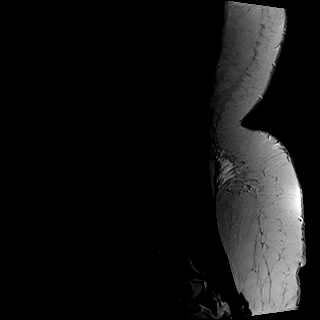
[im 17/17]
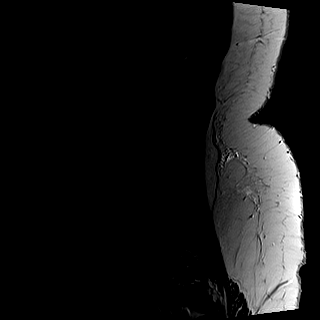

[Series 3: T1 · sagittal · 4.0mm · 0.41mm/px · 7 of 17 slices shown (1 of 2)]
[im 1/17]
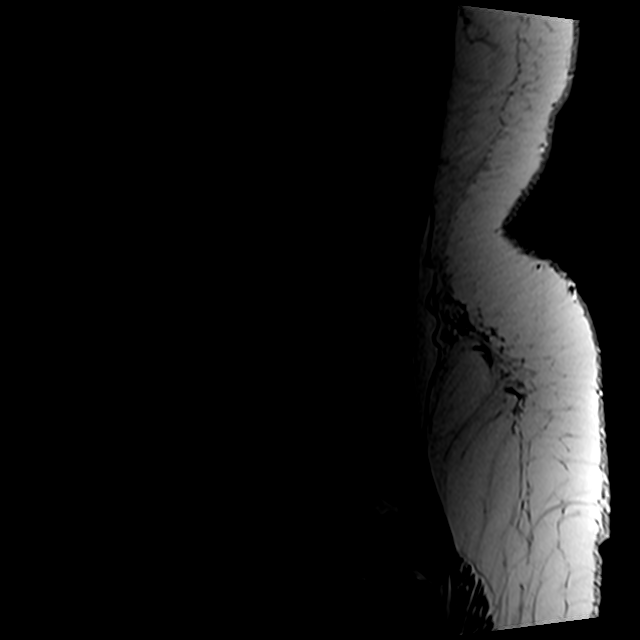
[im 3/17]
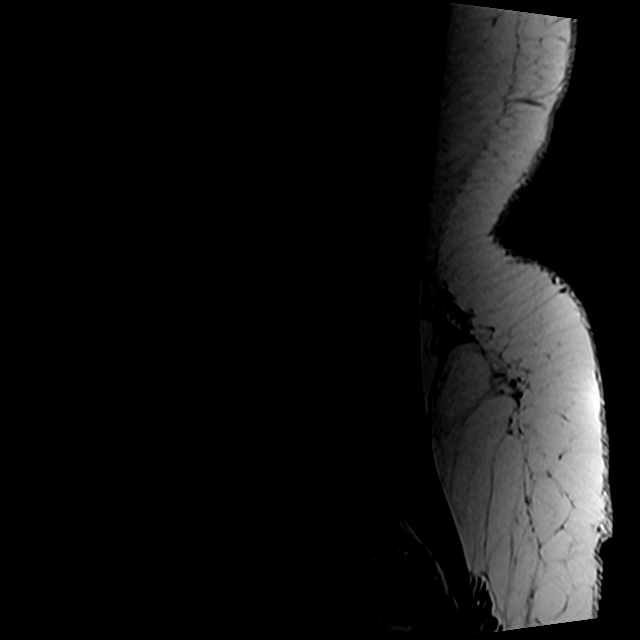
[im 6/17]
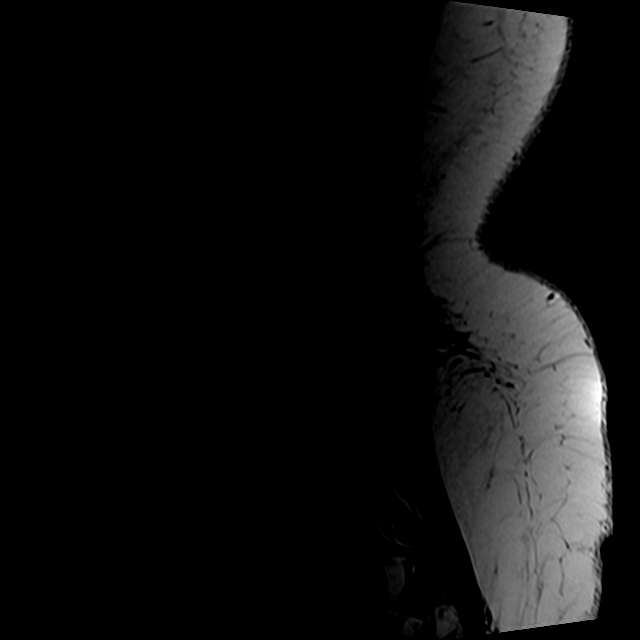
[im 9/17]
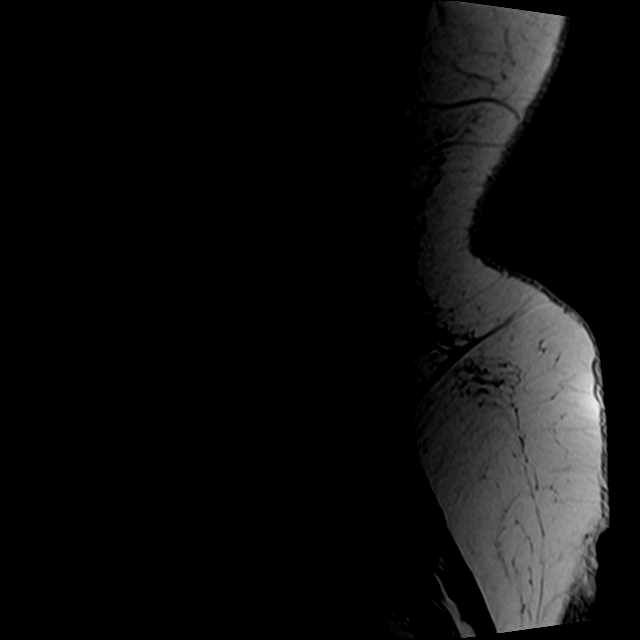
[im 11/17]
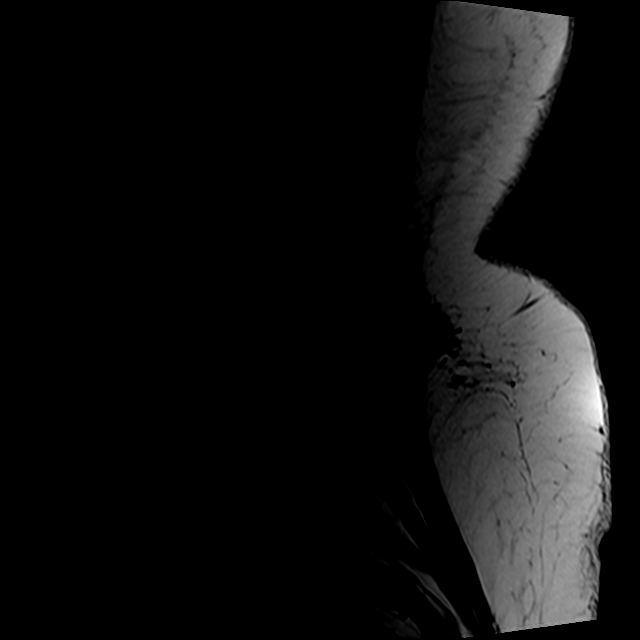
[im 14/17]
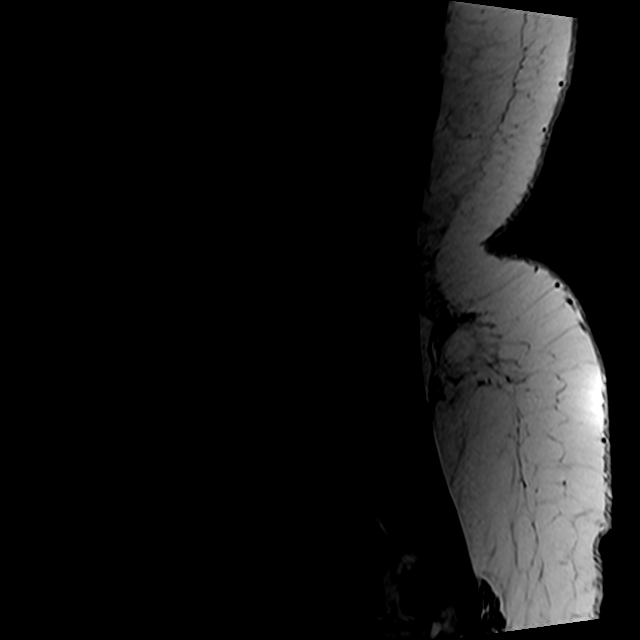
[im 17/17]
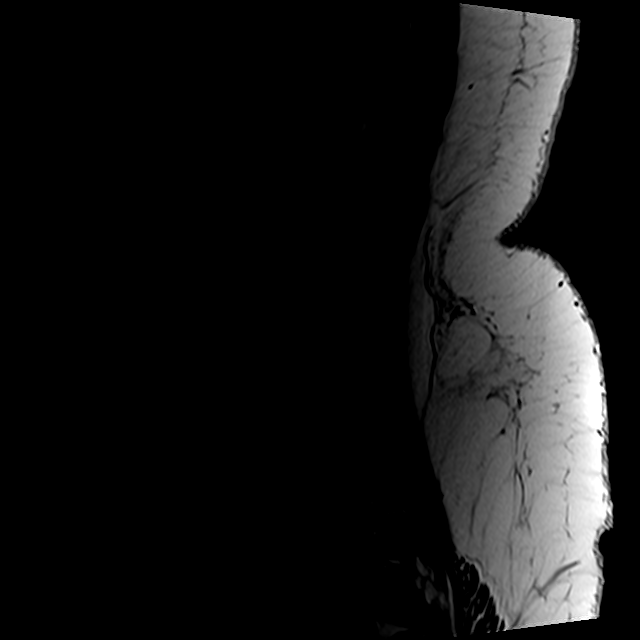

[Series 5: T2 · axial · 4.0mm · 0.78mm/px · z∈[-185,+36]mm · 8 of 37 slices shown (2 of 2)]
[im 1/37]
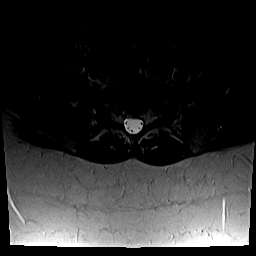
[im 6/37]
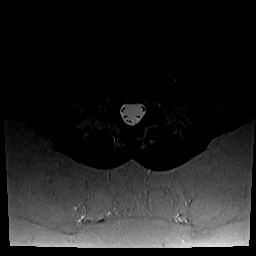
[im 12/37]
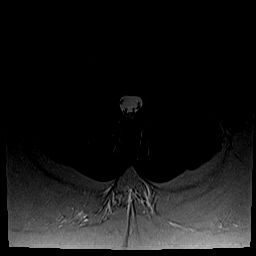
[im 17/37]
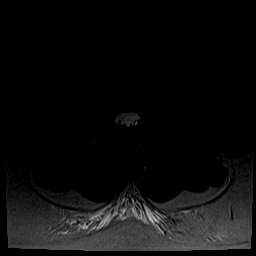
[im 20/37]
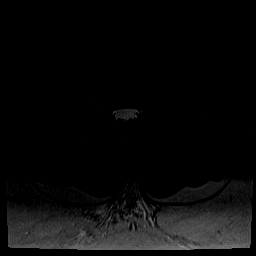
[im 25/37]
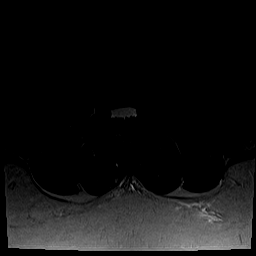
[im 31/37]
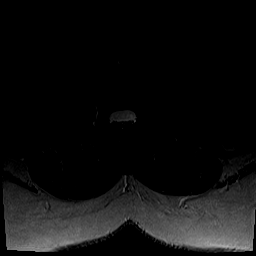
[im 37/37]
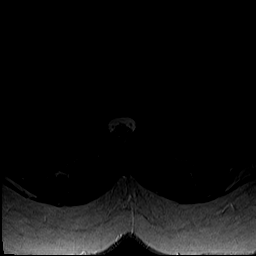

[Series 6: T1 · axial · 4.0mm · 0.39mm/px · z∈[-163,+6]mm · 3 of 37 slices shown (2 of 2)]
[im 6/37]
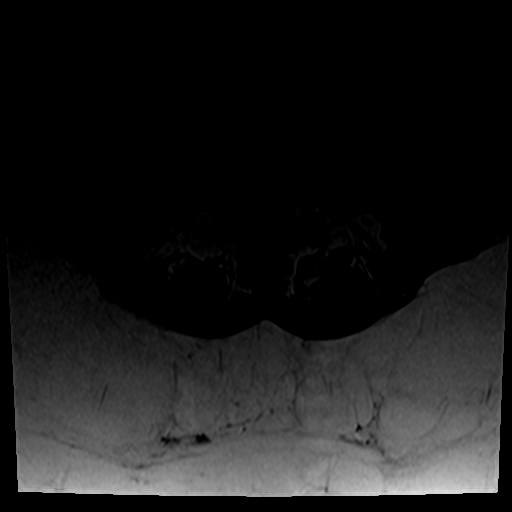
[im 20/37]
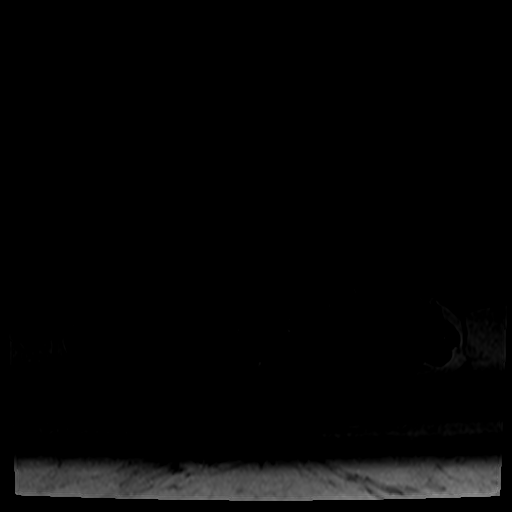
[im 31/37]
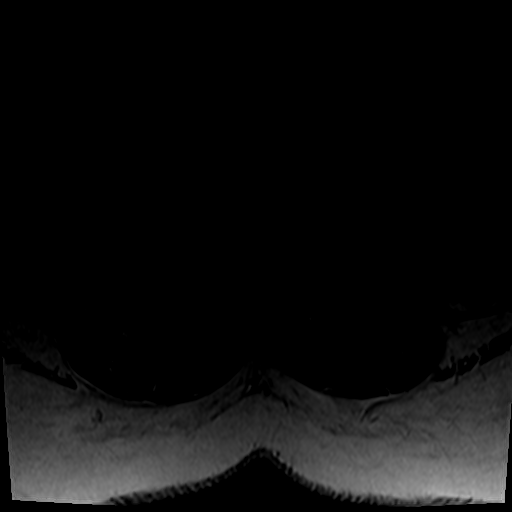

[24 of 48 positions shown; findings below may reference images not displayed]

FINDINGS: Segmentation: Transitional lumbosacral anatomy. For the purposes of
this dictation, there is lumbarization of S1 and small ribs at L1.

Alignment:  Normal.

Vertebrae: Chronic pars defects at L5 bilaterally. No evidence of
acute fracture or discitis/osteomyelitis. No suspicious bone
lesions.

Conus medullaris and cauda equina: Conus extends to the superior L2
level. Conus appears normal.

Paraspinal and other soft tissues: Unremarkable.

Disc levels:

T12-L1: No significant disc protrusion, foraminal stenosis, or canal
stenosis.

L1-L2: No significant disc protrusion, foraminal stenosis, or canal
stenosis.

L2-L3: No significant disc protrusion, foraminal stenosis, or canal
stenosis.

L3-L4: No significant disc protrusion, foraminal stenosis, or canal
stenosis.

L4-L5: No significant disc protrusion, foraminal stenosis, or canal
stenosis. Mild bilateral facet arthropathy.

L5-S1: Bilateral L5 pars defects. Moderate bilateral facet
arthropathy. Mild disc bulging, mildly eccentric to the right.
Resulting mild right foraminal stenosis without significant canal
stenosis.

S1-S2: Transitional anatomy. No significant canal or foraminal
stenosis.
IMPRESSION: 1. Transitional lumbosacral anatomy. For the purposes of this
dictation, there is lumbarization of S1 and small ribs at L1.
Correlation with radiographs is recommended prior to any operative
intervention.
2. Chronic bilateral L5 pars defects. Resulting moderate bilateral
facet arthropathy at this level with mild right L5-S1 foraminal
stenosis. No sagittal subluxation.

## 2021-01-27 IMAGING — CR DG ABDOMEN 1V
2 series · 2 of 2 positions shown · non-contrast
Comparison: [DATE]

CLINICAL DATA: Pre MRI screening

EXAM:
ABDOMEN - 1 VIEW

[abdomen kub (1 of 2)]
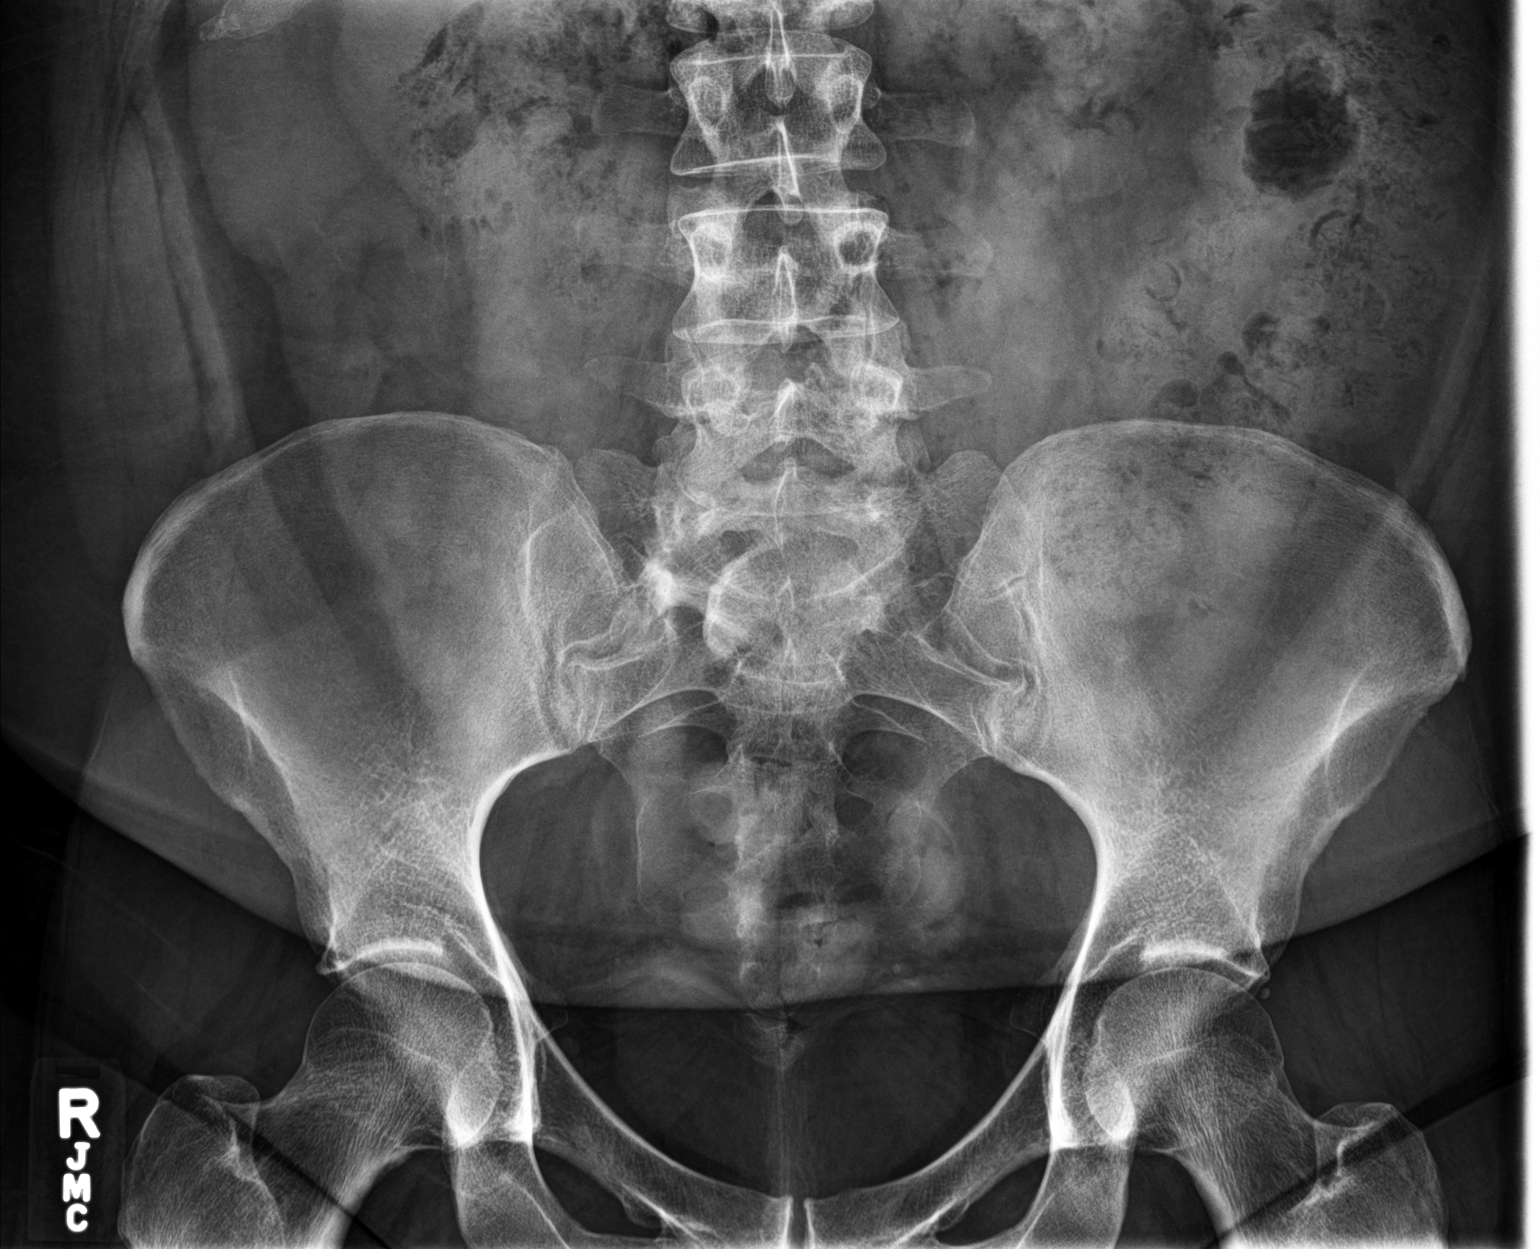

[abdomen kub (2 of 2)]
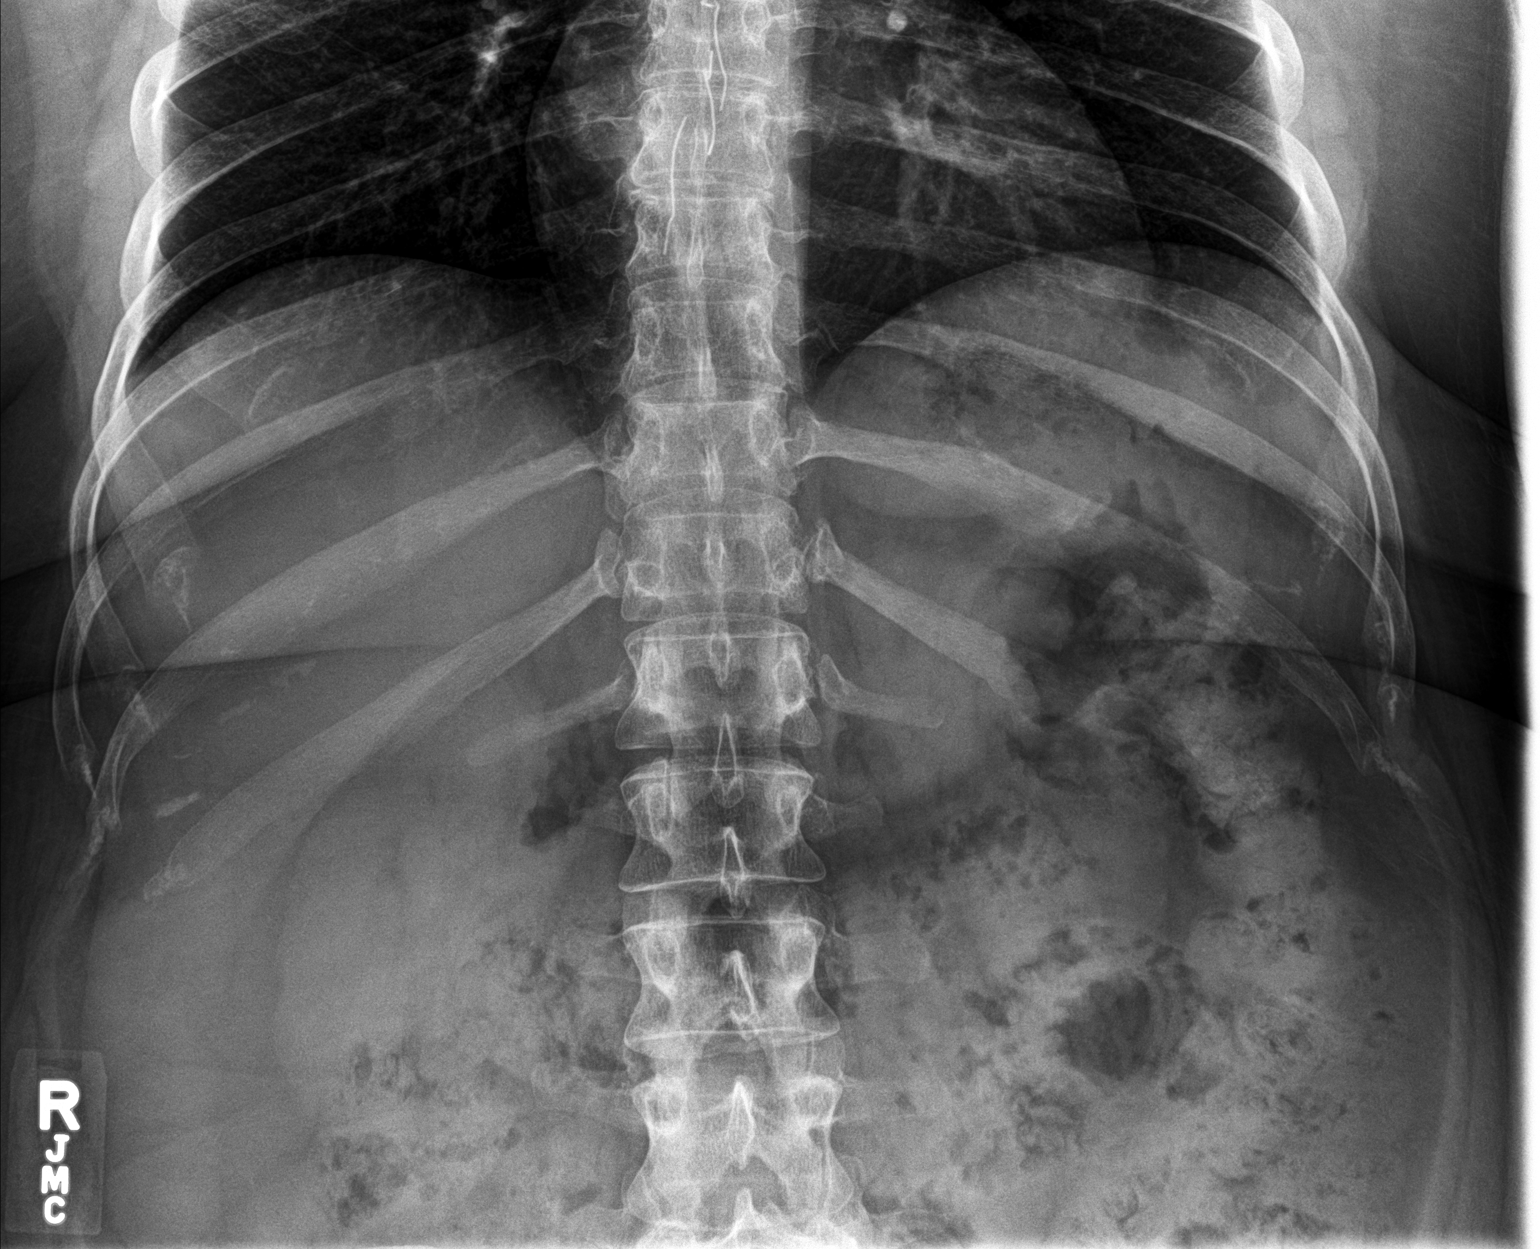

[2 of 2 positions shown; findings below may reference images not displayed]

FINDINGS: Large stool burden within the colon. There is a non obstructive
bowel gas pattern. No supine evidence of free air. No organomegaly
or suspicious calcification. No acute bony abnormality. No
radiopaque foreign body.
IMPRESSION: Large stool burden.  No acute findings.

## 2021-01-27 IMAGING — CR DG CHEST 2V
2 series · 2 of 2 positions shown · non-contrast
Comparison: [DATE]

CLINICAL DATA: History of foreign body ingestion. Pre MRI
screening.

EXAM:
CHEST - 2 VIEW

[chest pa]
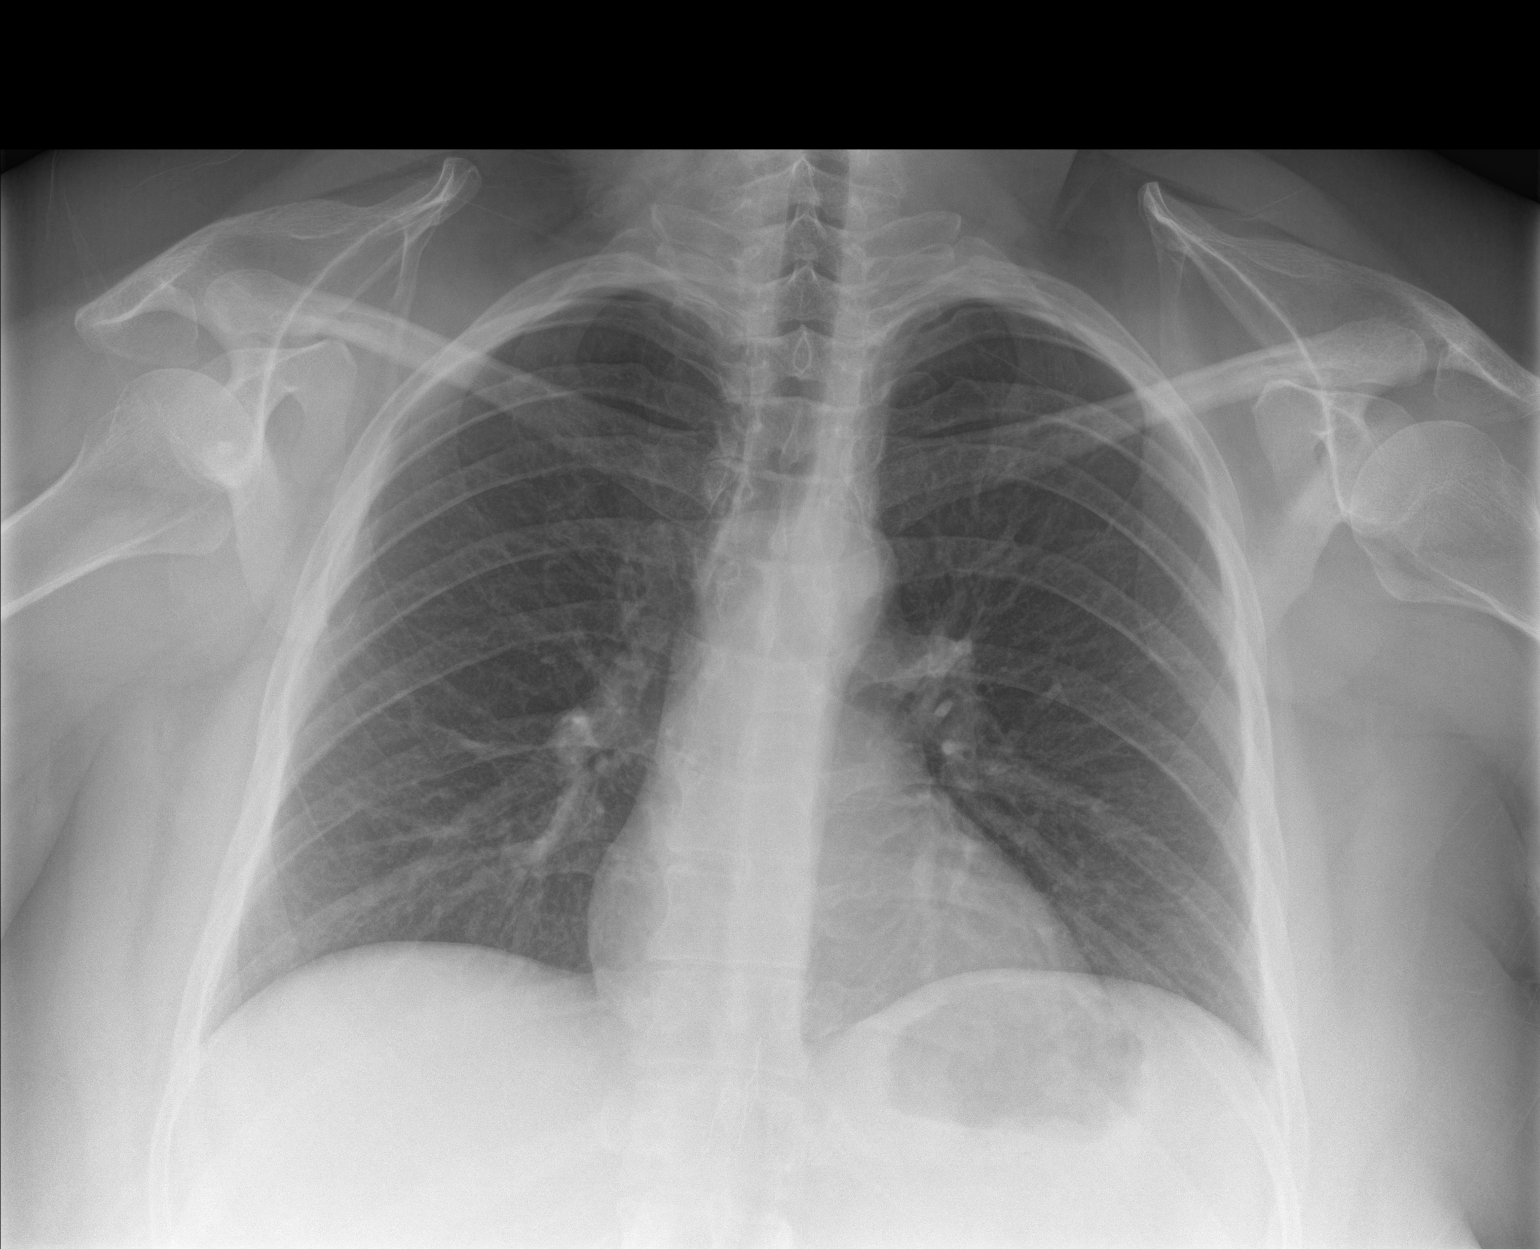

[chest lat]
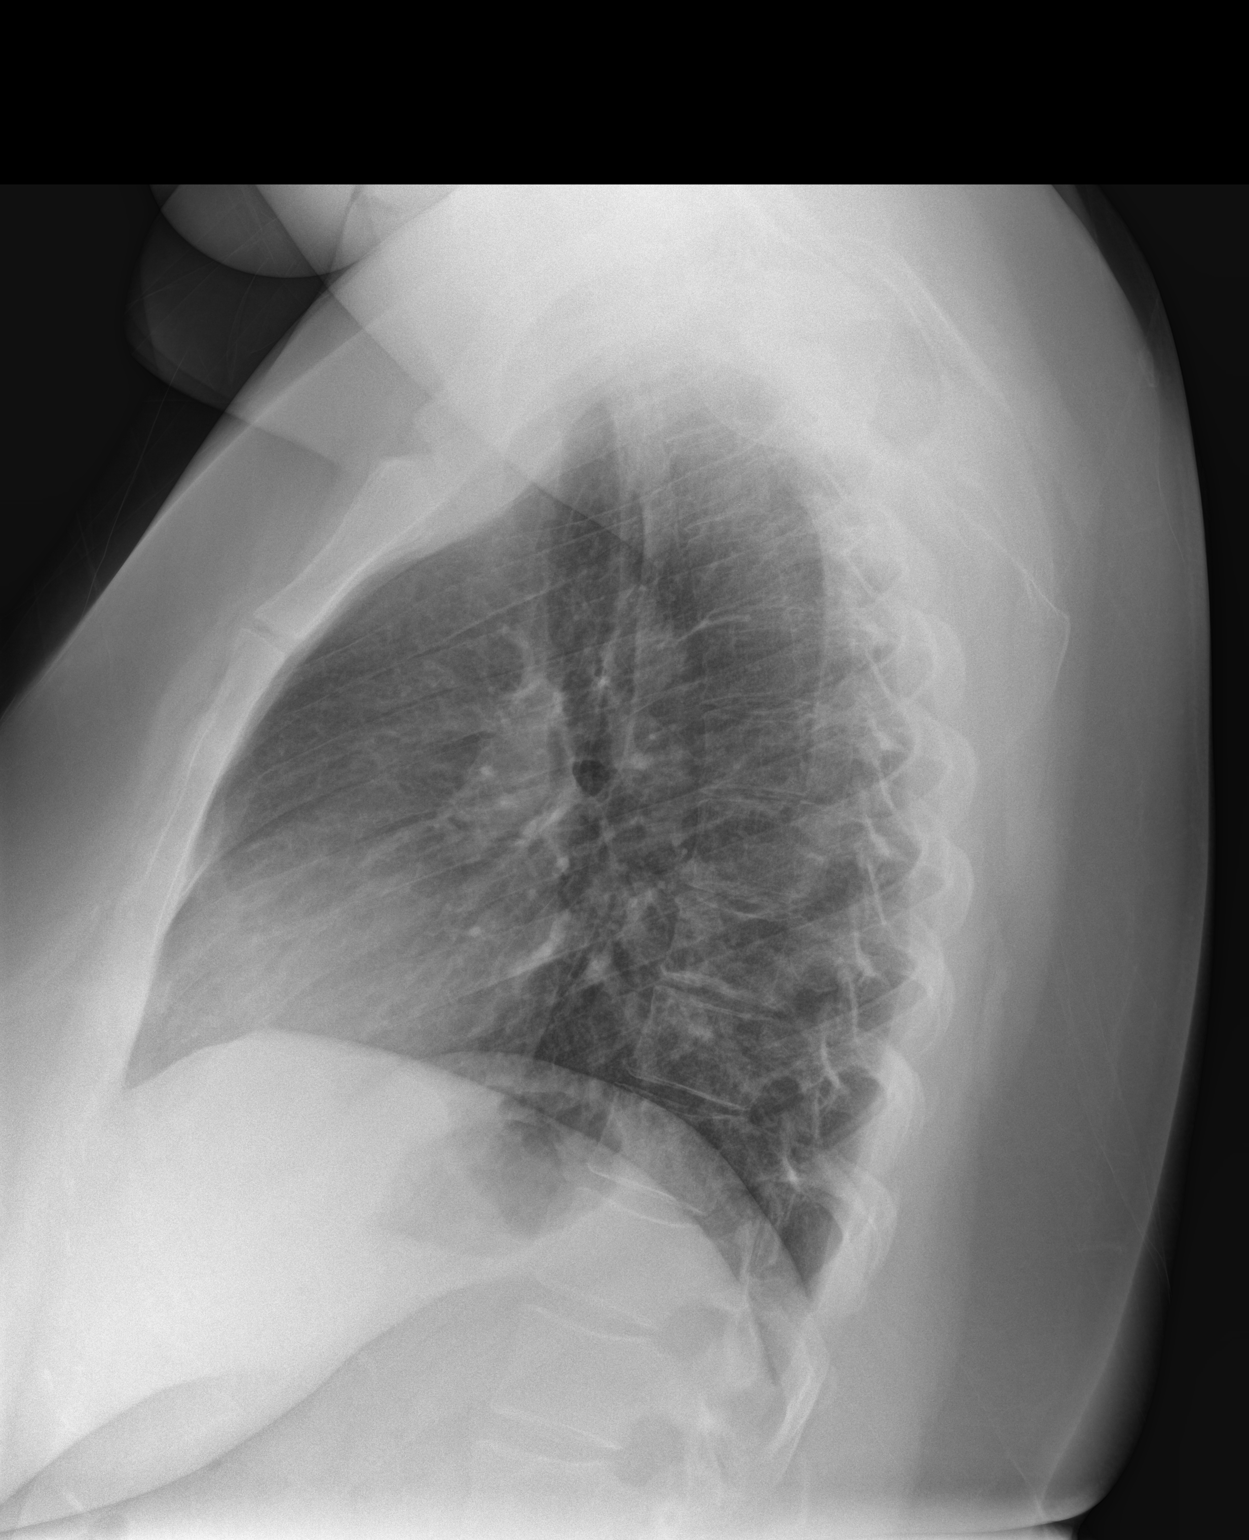

[2 of 2 positions shown; findings below may reference images not displayed]

FINDINGS: The heart size and mediastinal contours are within normal limits.
Both lungs are clear. The visualized skeletal structures are
unremarkable. No radiopaque foreign body.
IMPRESSION: Negative.

## 2021-01-29 ENCOUNTER — Encounter: Payer: Self-pay | Admitting: Emergency Medicine

## 2021-01-29 ENCOUNTER — Ambulatory Visit
Admission: EM | Admit: 2021-01-29 | Discharge: 2021-01-29 | Disposition: A | Discharge status: Home / Self Care | Payer: Medicare Other | Attending: Physician Assistant | Admitting: Physician Assistant

## 2021-01-29 ENCOUNTER — Other Ambulatory Visit: Payer: Self-pay

## 2021-01-29 ENCOUNTER — Ambulatory Visit (INDEPENDENT_AMBULATORY_CARE_PROVIDER_SITE_OTHER): Payer: Medicare Other

## 2021-01-29 DIAGNOSIS — W19XXXA Unspecified fall, initial encounter: Secondary | ICD-10-CM

## 2021-01-29 DIAGNOSIS — M25572 Pain in left ankle and joints of left foot: Secondary | ICD-10-CM

## 2021-01-29 DIAGNOSIS — S93402A Sprain of unspecified ligament of left ankle, initial encounter: Secondary | ICD-10-CM | POA: Diagnosis not present

## 2021-01-29 IMAGING — CR DG FOOT COMPLETE 3+V*L*
4 series · 5 of 5 positions shown · non-contrast
Comparison: None.

CLINICAL DATA: Posttraumatic left foot and ankle pain due to fall
yesterday

EXAM:
LEFT FOOT - COMPLETE 3+ VIEW

[foot ap]
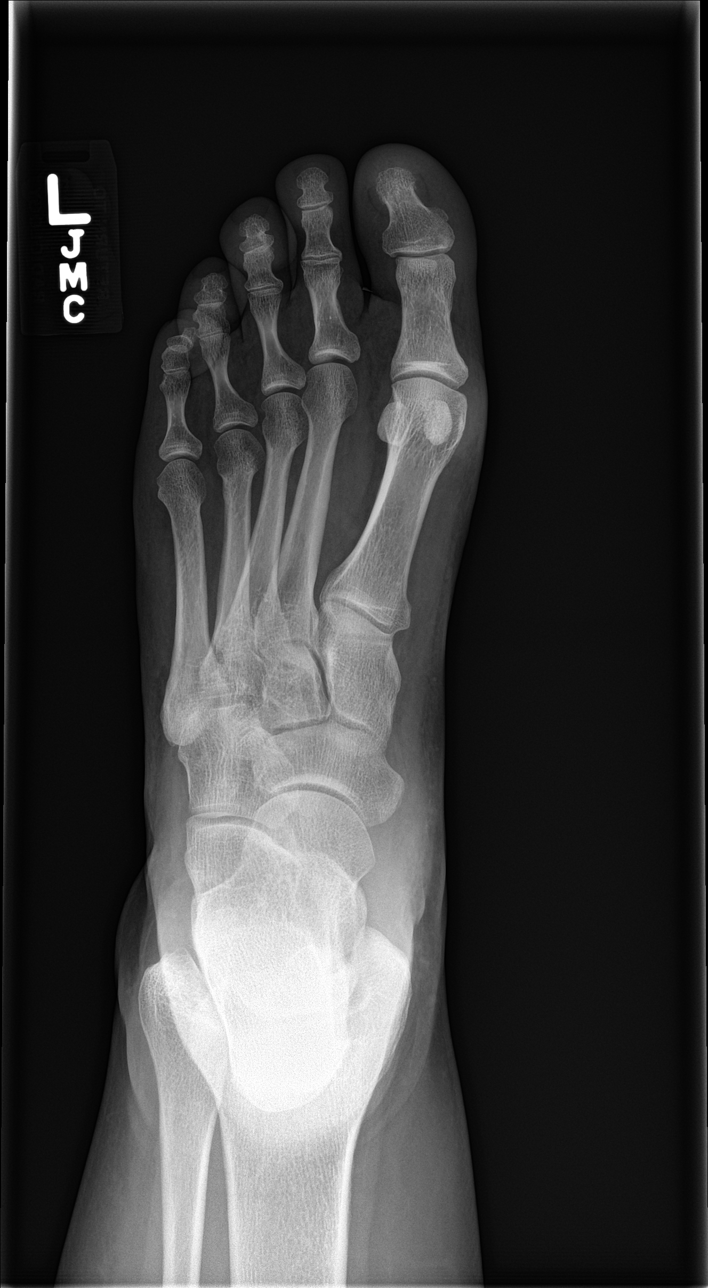

[foot obl (1 of 2)]
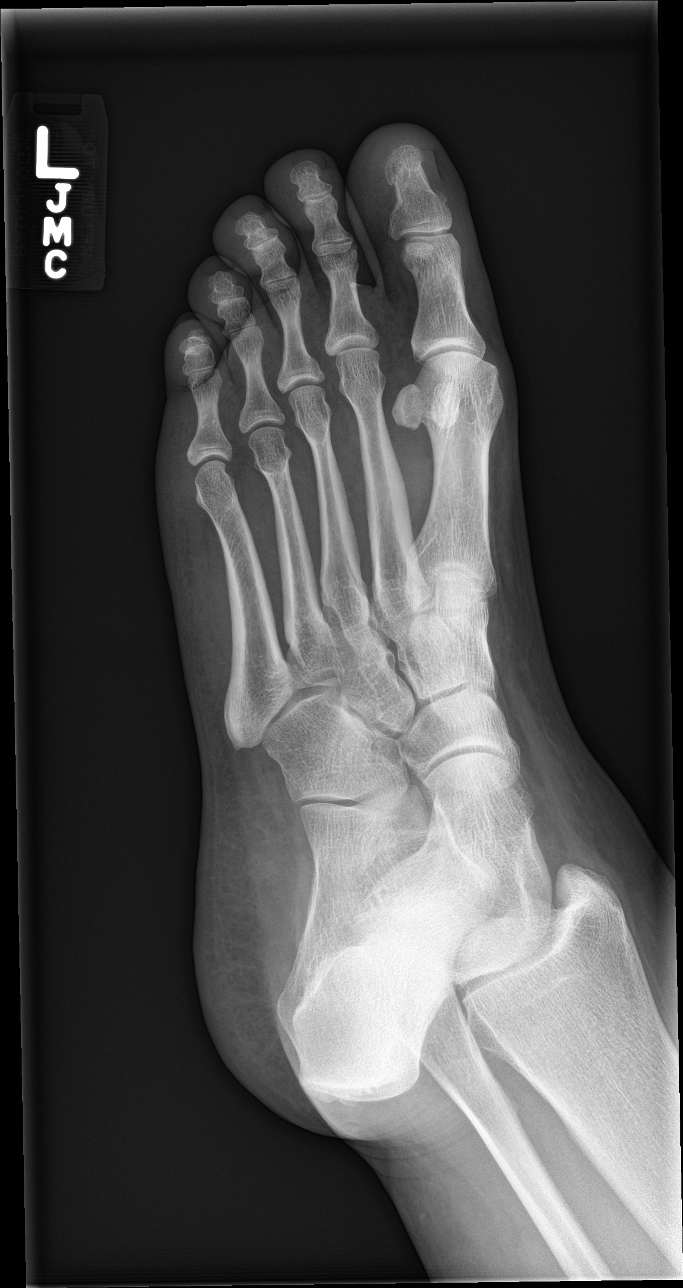

[Series 3: foot lat · 0.14mm/px · 2 of 2 slices shown]
[im 1/2]
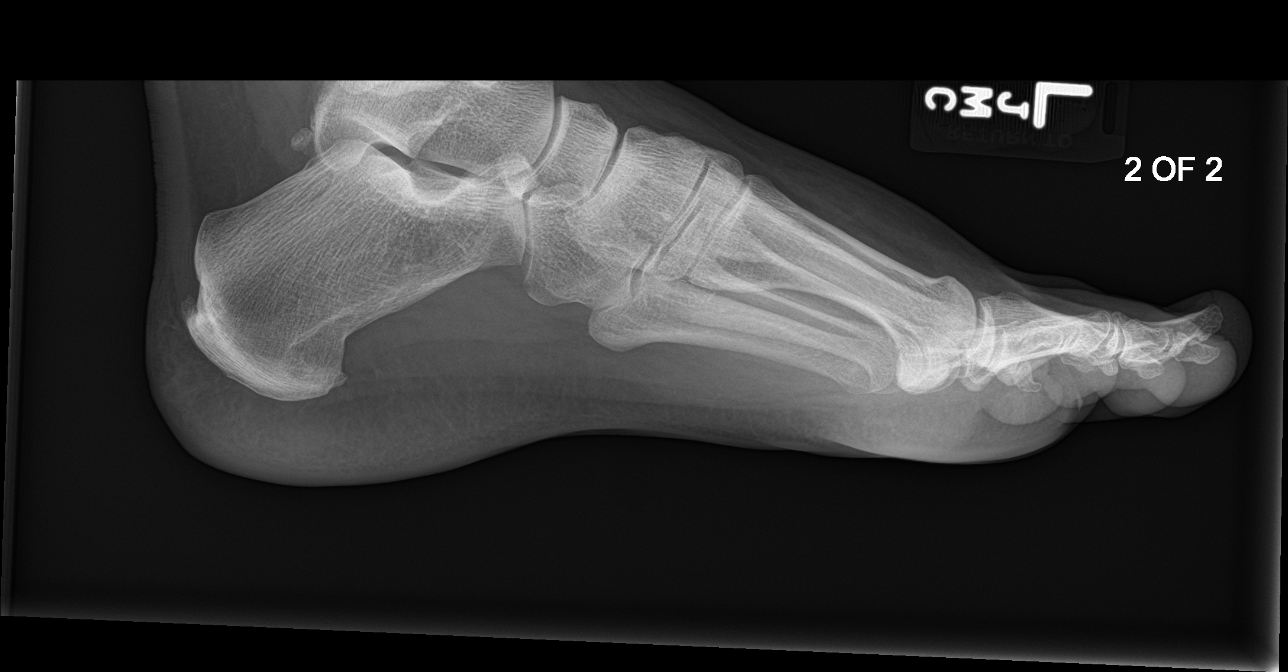
[im 2/2]
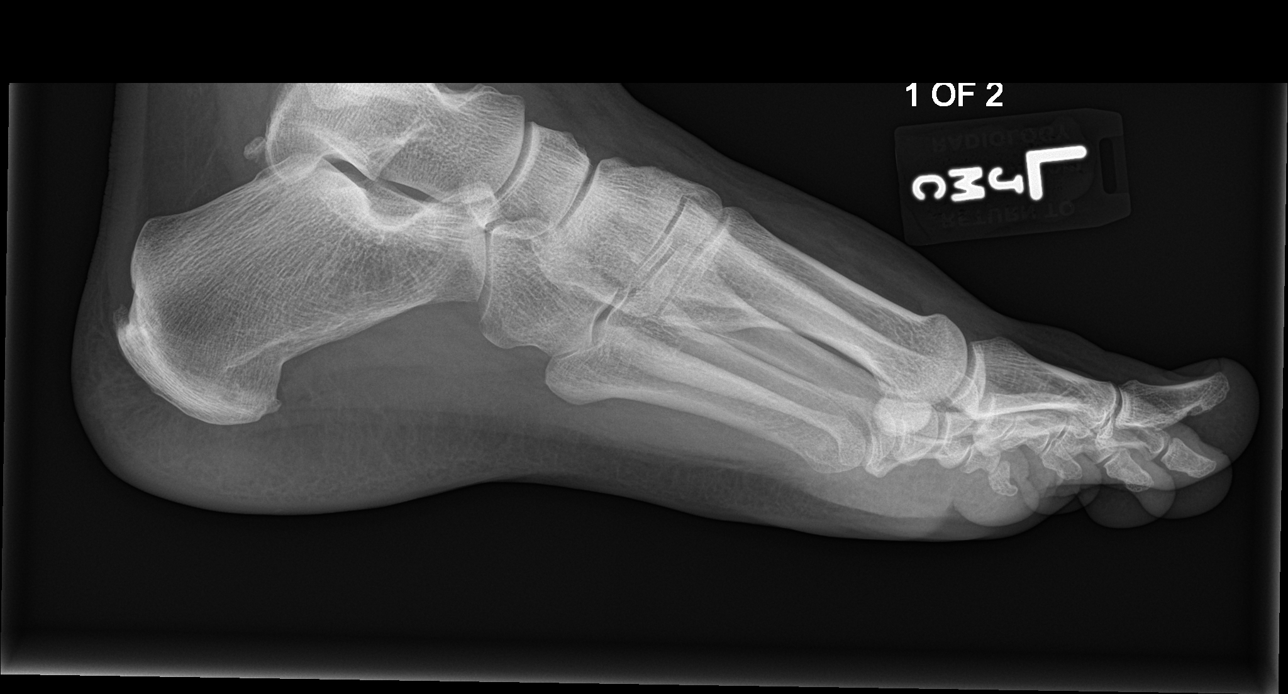

[foot obl (2 of 2)]
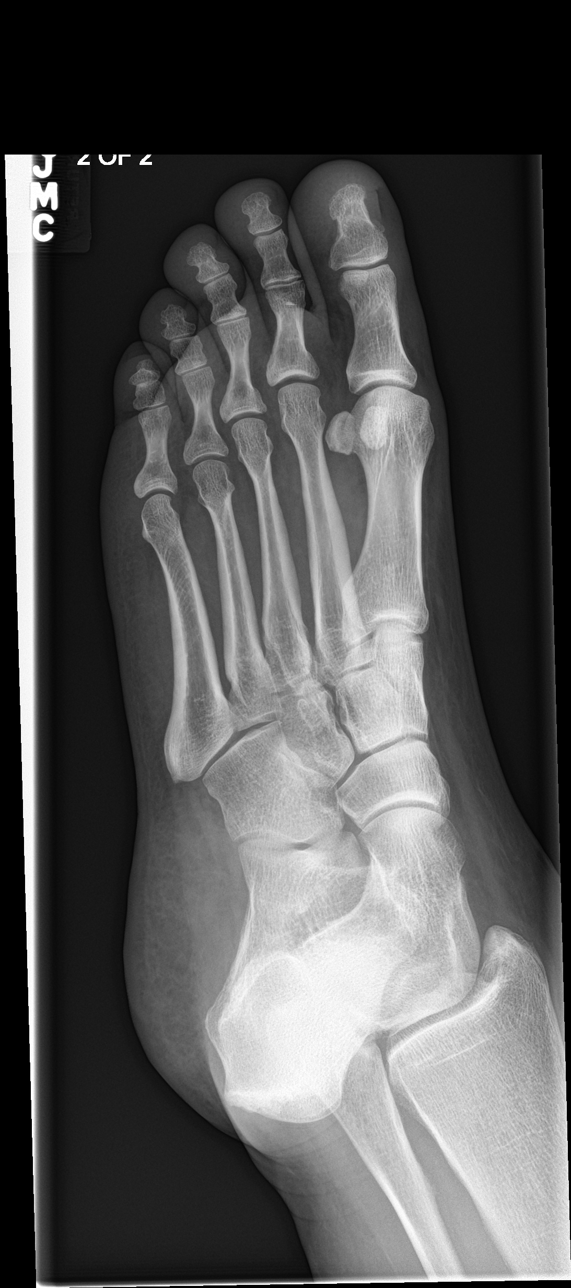

[5 of 5 positions shown; findings below may reference images not displayed]

FINDINGS: There is no evidence of fracture or dislocation. There is no
evidence of arthropathy or other focal bone abnormality. Soft
tissues are unremarkable.
IMPRESSION: Negative.

## 2021-01-29 IMAGING — CR DG ANKLE COMPLETE 3+V*L*
4 series · 4 of 4 positions shown · non-contrast
Comparison: None.

CLINICAL DATA: Left foot and ankle pain due to fall yesterday

EXAM:
LEFT ANKLE COMPLETE - 3+ VIEW

[ankle ap]
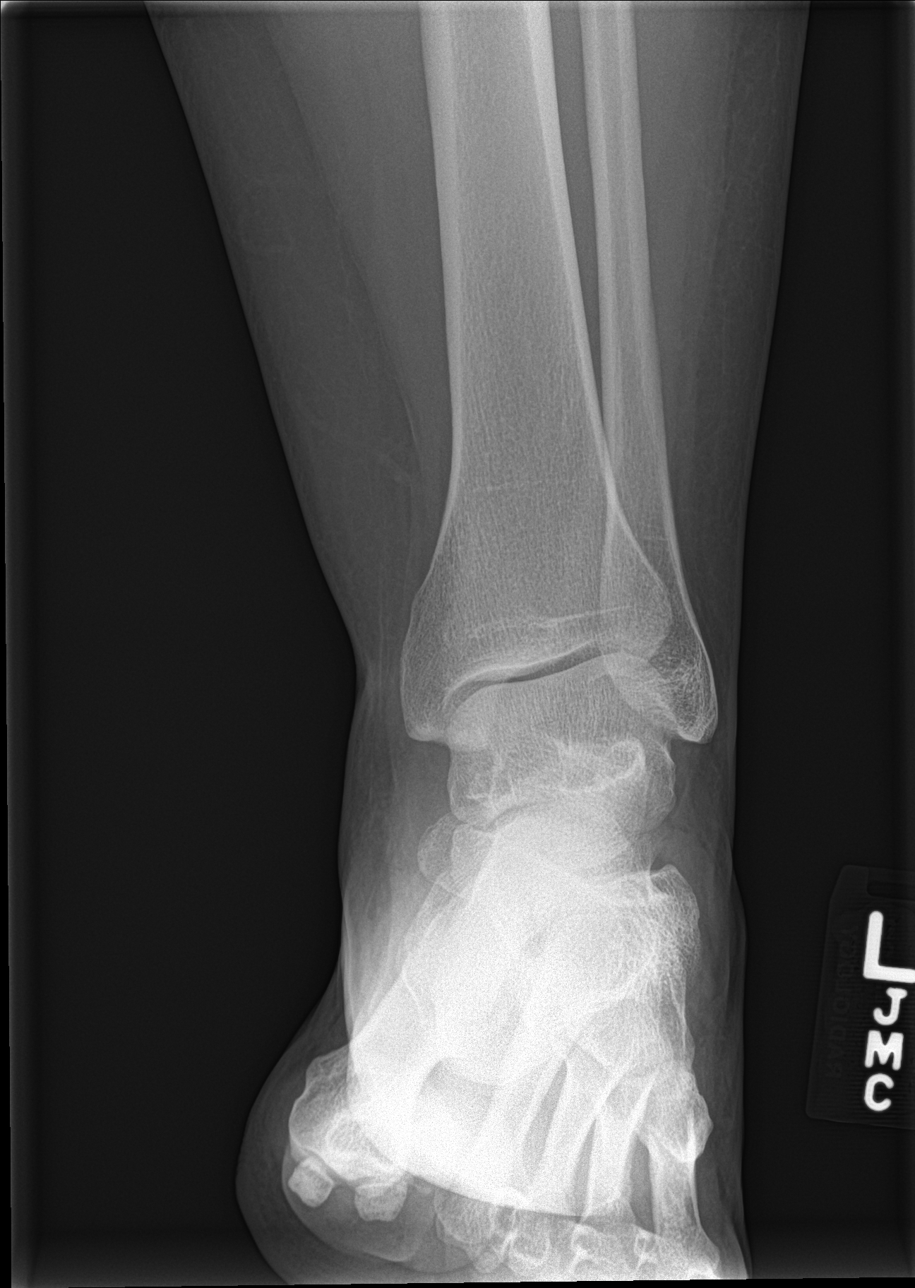

[ankle obl (1 of 2)]
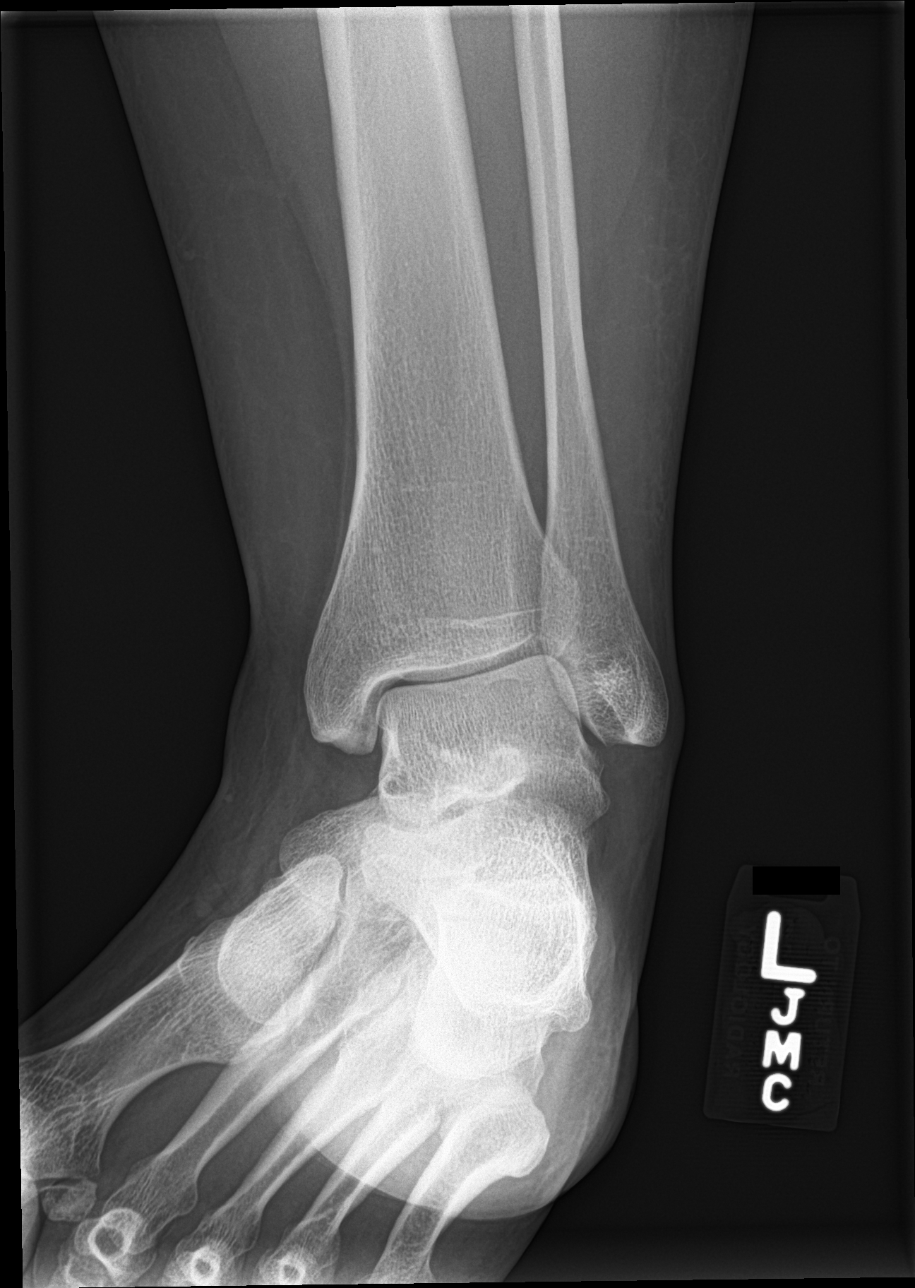

[ankle lat]
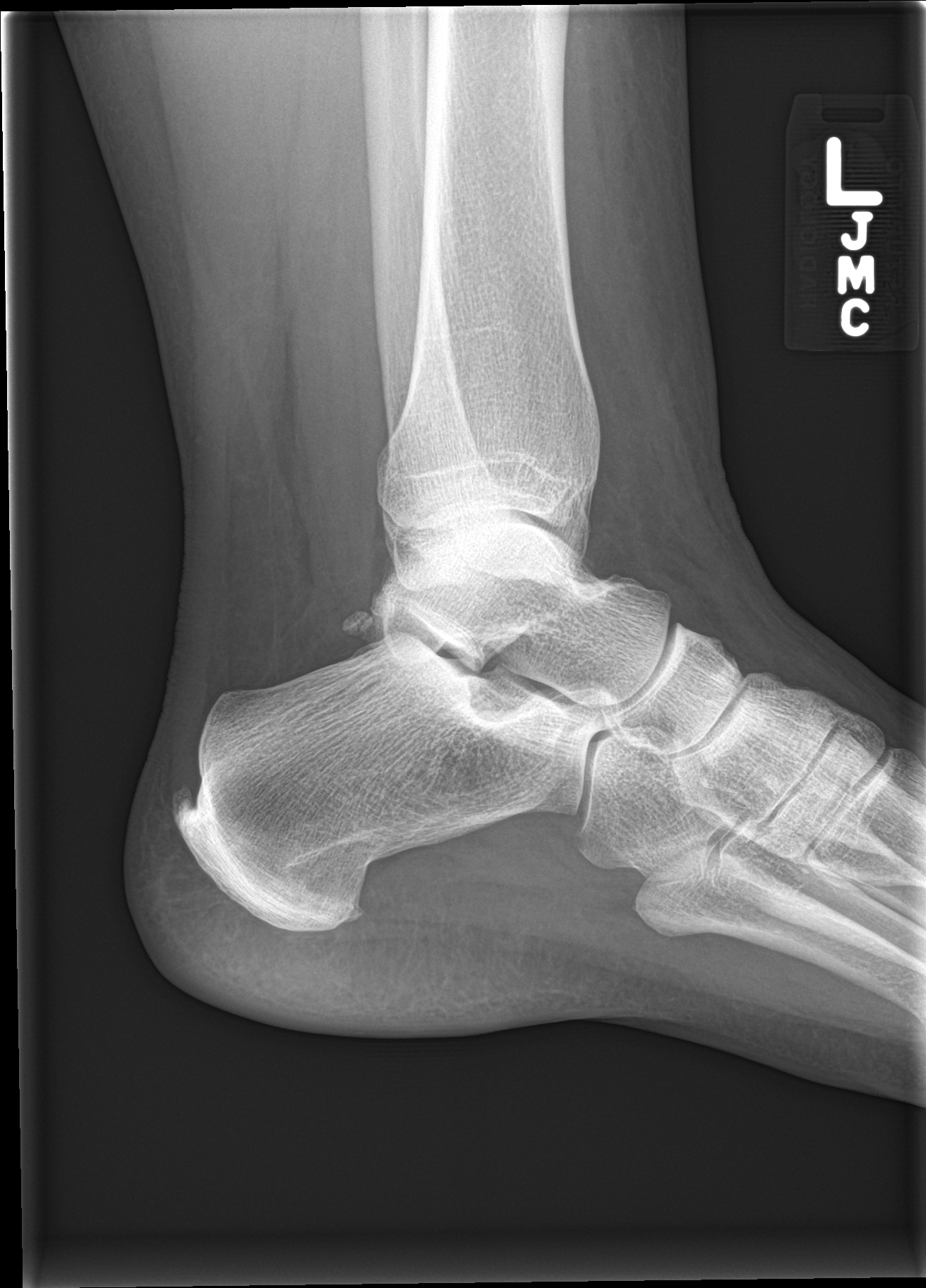

[ankle obl (2 of 2)]
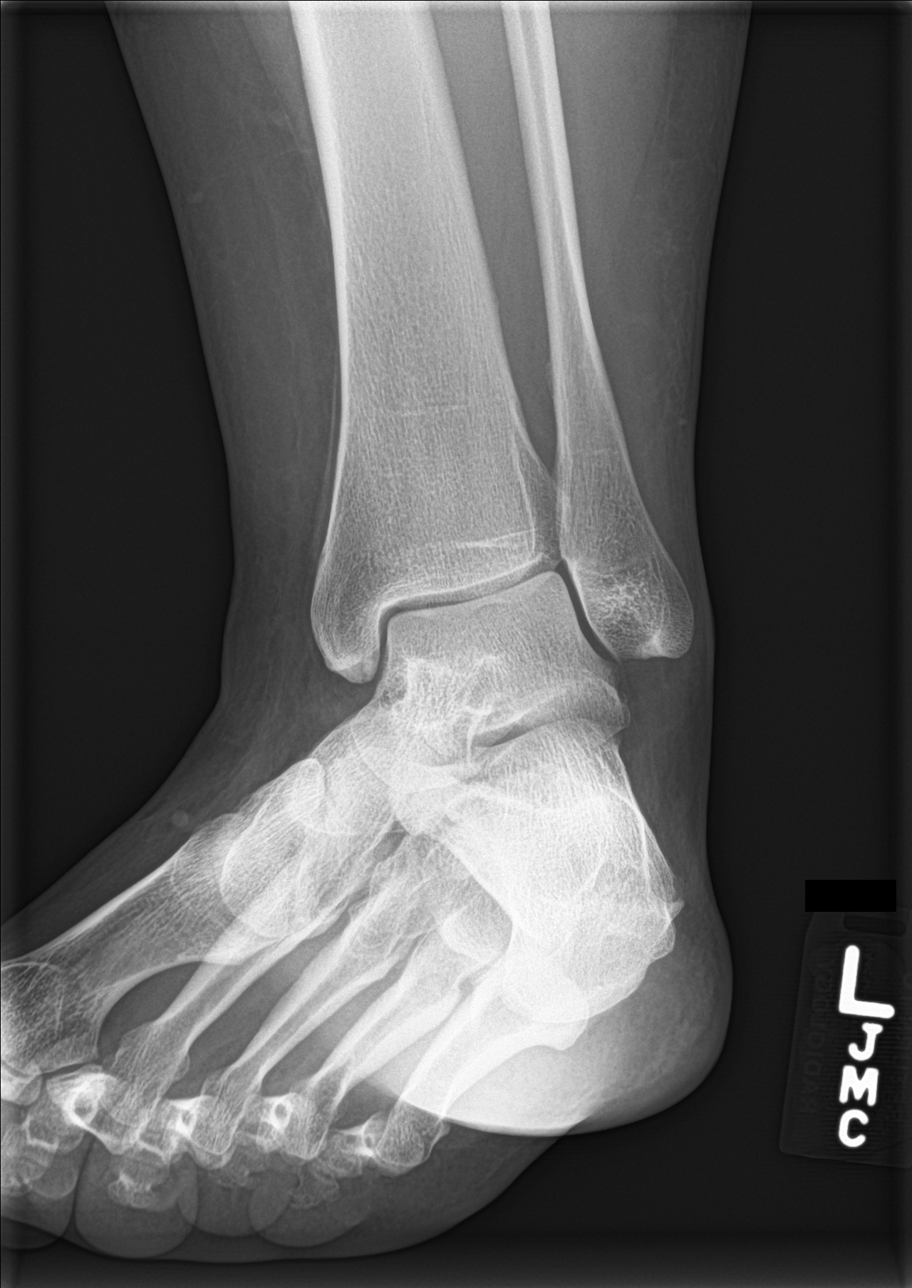

[4 of 4 positions shown; findings below may reference images not displayed]

FINDINGS: There is no evidence of fracture, dislocation, or joint effusion.
There is no evidence of arthropathy or other focal bone abnormality.
Soft tissues are unremarkable.
IMPRESSION: Negative.

## 2021-01-29 MED ORDER — NAPROXEN 500 MG PO TABS: 500.0000 mg | ORAL_TABLET | Freq: Two times a day (BID) | ORAL | 0 refills | Status: AC

## 2021-01-29 NOTE — ED Provider Notes (Signed)
[facility]    CSN: [id number] Arrival date & time: 01/29/21  0846      History   Chief Complaint Chief Complaint  Patient presents with   Fall   Ankle Pain    HPI Melanie Bell is a 30 y.o. female presenting for left foot and ankle pain following an accidental fall yesterday.  Patient says she was going up 2 stairs when her right knee gave out and she fell and inverted her left foot/ankle.  Patient admits to some swelling and diffuse pain.  She has been using her crutches because it hurts too much to bear weight on the affected foot/ankle.  She has taken Tylenol with no relief.  Patient does have an orthopedist that she sees regularly for chronic back pain and right knee pain.  Patient says she is supposed to be getting corticosteroid injection in her knee soon.  Has an appointment coming up fairly soon.  Patient concerned about possible fracture in the foot or ankle at this time.  Admits to weakness.  No numbness or tingling.  No reported history of fracture of affected extremity.  No other complaints or concerns.  HPI  Past Medical History:  Diagnosis Date   Allergy    Anxiety    Arthritis    Asthma    Depression    Foreign body aspiration    GERD (gastroesophageal reflux disease)    Hallucinations 09/30/2014   Sizoaffective   Hyperlipidemia    Hypertension    Intentional ingestion of batteries 04/21/2016    2 AAA batteries and 1 thumb tack; intent to hurt herself/notes [id number]   Self-inflicted injury [id number]   Suicidal ideation 01/02/2018   Tardive dyskinesia 10/2014   recent onset   Thyroid disease     Patient Active Problem List   Diagnosis Date Noted   Mild intermittent asthma without complication [id number]   Gastroesophageal reflux disease 12/01/2020   Hypoglycemia 12/01/2020   History of hypoglycemia 04/26/2020   History of hyperprolactinemia 04/26/2020   Rheumatoid factor positive 12/09/2019   SS-A antibody positive 10/28/2019    Chronic pain of both knees 10/28/2019   Subclinical hypothyroidism 08/02/2019   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   Schizoaffective disorder, depressive type ([place]) 02/02/2018   Suicide attempt ([place]) 01/16/2018   Dysfunctional uterine bleeding 01/06/2018   Borderline personality disorder ([place]) 01/04/2018   PTSD (post-traumatic stress disorder) 01/03/2018   ASCUS of cervix with negative high risk HPV 08/28/2017   Malingering 05/06/2016   Breast tumor 03/05/2016   Schizoaffective disorder, bipolar type ([place]) 11/06/2014   Hypertension 09/29/2014    Past Surgical History:  Procedure Laterality Date   ABDOMINAL SURGERY     "years ago" to remove foreign objects   APPENDECTOMY     BREAST EXCISIONAL BIOPSY Right 09/03/2007   surgical bx removed fibroadeoma   BREAST LUMPECTOMY Right    COLONOSCOPY WITH PROPOFOL N/A 09/10/2015   Procedure: COLONOSCOPY WITH PROPOFOL;  Surgeon: [name], MD;  Location: [facility] ENDOSCOPY;  Service: Endoscopy;  Laterality: N/A;   ESOPHAGOGASTRODUODENOSCOPY N/A 11/28/2014   Procedure: ESOPHAGOGASTRODUODENOSCOPY (EGD);  Surgeon: [name], MD;  Location[facility] ENDOSCOPY;  Service: Endoscopy;  Laterality: N/A;   ESOPHAGOGASTRODUODENOSCOPY N/A 02/21/2016   Procedure: ESOPHAGOGASTRODUODENOSCOPY (EGD);  Surgeon: Mauri Pole, MD;  Location: [facility] ENDOSCOPY;  Service: Endoscopy;  Laterality: N/A;   ESOPHAGOGASTRODUODENOSCOPY N/A 04/21/2016   Procedure: ESOPHAGOGASTRODUODENOSCOPY (EGD);  Surgeon: Gatha Mayer, MD;  Location: [facility] ENDOSCOPY;  Service: Endoscopy;  Laterality: N/A;   ESOPHAGOGASTRODUODENOSCOPY  N/A 05/06/2016   Procedure: ESOPHAGOGASTRODUODENOSCOPY (EGD);  Surgeon: Jonathon Bellows, MD;  Location: [facility] ENDOSCOPY;  Service: Gastroenterology;  Laterality: N/A;   ESOPHAGOGASTRODUODENOSCOPY N/A 06/13/2016   Procedure: ESOPHAGOGASTRODUODENOSCOPY (EGD);  Surgeon: [name], MD;  Location: [facility] ENDOSCOPY;  Service: Endoscopy;  Laterality: N/A;    ESOPHAGOGASTRODUODENOSCOPY (EGD) WITH PROPOFOL N/A 02/29/2016   Procedure: ESOPHAGOGASTRODUODENOSCOPY (EGD) WITH PROPOFOL;  Surgeon: [name], MD;  Location: [facility] ENDOSCOPY;  Service: Endoscopy;  Laterality: N/A;   ESOPHAGOGASTRODUODENOSCOPY (EGD) WITH PROPOFOL N/A 01/15/2018   Procedure: ESOPHAGOGASTRODUODENOSCOPY (EGD) WITH PROPOFOL;  Surgeon: Daneil Dolin, MD;  Location: [facility] ENDO SUITE;  Service: Endoscopy;  Laterality: N/A;  retrieval forein body   LAPAROTOMY N/A 09/12/2015   Procedure: EXPLORATORY LAPAROTOMY;  Surgeon: Florene Glen, MD;  Location: [facility] ORS;  Service: General;  Laterality: N/A;   SIGMOIDOSCOPY N/A 09/12/2015   Procedure: Lonell Face;  Surgeon: Florene Glen, MD;  Location: [facility] ORS;  Service: General;  Laterality: N/A;   WISDOM TOOTH EXTRACTION      OB History     Gravida  3   Para  0   Term  0   Preterm  0   AB  3   Living  0      SAB  3   IAB  0   Ectopic  0   Multiple  0   Live Births               Home Medications    Prior to Admission medications   Medication Sig Start Date End Date Taking? Authorizing Provider  escitalopram (LEXAPRO) 5 MG tablet Take 5 mg by mouth every morning. 05/27/19  Yes [provider]  hydrOXYzine (VISTARIL) 50 MG capsule Take 50 mg by mouth 2 (two) times daily as needed. 08/20/19  Yes [provider]  levothyroxine (SYNTHROID) 75 MCG tablet Take 1 tablet (75 mcg total) by mouth daily. 10/29/20  Yes Shamleffer, Melanie Crazier, MD  lithium carbonate 300 MG capsule Take 1 capsule (300 mg total) by mouth 2 (two) times daily with a meal. 12/11/17  Yes [name], [name], MD  naproxen (NAPROSYN) 500 MG tablet Take 1 tablet (500 mg total) by mouth 2 (two) times daily with a meal for 10 days. 01/29/21 02/08/21 Yes Danton Clap, PA-C  omeprazole (PRILOSEC) 20 MG capsule TAKE 1 CAPSULE (20 MG TOTAL) BY MOUTH DAILY. 01/21/21  Yes Glean Hess, MD  paliperidone (INVEGA) 6 MG 24 hr tablet  Take 6 mg by mouth every morning. 05/30/19  Yes [provider]  prazosin (MINIPRESS) 2 MG capsule Take 1 capsule (2 mg total) by mouth at bedtime. 01/12/18  Yes McNew, Tyson Babinski, MD  albuterol (VENTOLIN HFA) 108 (90 Base) MCG/ACT inhaler 2 puffs four times a day as needed 12/01/20   Glean Hess, MD    Family History Family History  Problem Relation Age of Onset   Depression Mother    Hypertension Mother    Sleep apnea Mother    Asthma Mother    COPD Mother    Diabetes Mother    Anxiety disorder Mother    Arthritis Mother    Breast cancer Maternal Aunt        20's   Stroke Maternal Grandmother     Social History Social History   Tobacco Use   Smoking status: Former    Packs/day: 0.50    Years: 3.00    Pack years: 1.50    Types: Cigarettes    Quit  date: 08/14/2016    Years since quitting: 4.4   Smokeless tobacco: Never  Vaping Use   Vaping Use: Never used  Substance Use Topics   Alcohol use: No   Drug use: No     Allergies   Betadine [povidone iodine], Fish allergy, Iodine, and Shellfish-derived products   Review of Systems Review of Systems  Musculoskeletal:  Positive for arthralgias, gait problem and joint swelling.  Skin:  Negative for color change and wound.  Neurological:  Negative for weakness and numbness.    Physical Exam Triage Vital Signs ED Triage Vitals  Enc Vitals Group     BP 01/29/21 0901 116/78     Pulse Rate 01/29/21 0901 83     Resp 01/29/21 0901 14     Temp 01/29/21 0901 98.5 F (36.9 C)     Temp Source 01/29/21 0901 Oral     SpO2 01/29/21 0901 98 %     Weight 01/29/21 0859 296 lb 8.3 oz (134.5 kg)     Height 01/29/21 0859 '5\' 6"'$  (1.676 m)     Head Circumference --      Peak Flow --      Pain Score 01/29/21 0859 7     Pain Loc --      Pain Edu? --      Excl. in [place]? --    No data found.  Updated Vital Signs BP 116/78 (BP Location: Left Arm)   Pulse 83   Temp 98.5 F (36.9 C) (Oral)   Resp 14   Ht '5\' 6"'$  (1.676 m)    Wt 296 lb 8.3 oz (134.5 kg)   LMP 01/11/2021 (Exact Date)   SpO2 98%   BMI 47.86 kg/m      Physical Exam Vitals and nursing note reviewed.  Constitutional:      General: She is not in acute distress.    Appearance: Normal appearance. She is obese. She is not ill-appearing or toxic-appearing.  HENT:     Head: Normocephalic and atraumatic.  Eyes:     General: No scleral icterus.       Right eye: No discharge.        Left eye: No discharge.     Conjunctiva/sclera: Conjunctivae normal.  Cardiovascular:     Rate and Rhythm: Normal rate and regular rhythm.     Pulses: Normal pulses.  Pulmonary:     Effort: Pulmonary effort is normal. No respiratory distress.  Musculoskeletal:     Cervical back: Neck supple.     Left ankle: Swelling (mild swelling lateral ankle.) and laceration (few minor abrasions of ankle and lower leg) present. No deformity. Tenderness present over the lateral malleolus, medial malleolus, ATF ligament and base of 5th metatarsal. Decreased range of motion (due to pain/guarding). Normal pulse.     Comments: Patient's pain seems out of proportion to condition. She pulls away, winces, and whines with light touch of any region of ankle that is palpated.   Skin:    General: Skin is dry.  Neurological:     General: No focal deficit present.     Mental Status: She is alert. Mental status is at baseline.     Motor: No weakness.     Gait: Gait abnormal (patient using crutches).  Psychiatric:        Mood and Affect: Mood normal.        Behavior: Behavior normal.        Thought Content: Thought content normal.     UC  Treatments / Results  Labs (all labs ordered are listed, but only abnormal results are displayed) Labs Reviewed - No data to display  EKG   Radiology DG Chest 2 View  Result Date: 01/27/2021 CLINICAL DATA:  History of foreign body ingestion. Pre MRI screening. EXAM: CHEST - 2 VIEW COMPARISON:  01/15/2018 FINDINGS: The heart size and mediastinal  contours are within normal limits. Both lungs are clear. The visualized skeletal structures are unremarkable. No radiopaque foreign body. IMPRESSION: Negative. Electronically Signed   By: Rolm Baptise M.D.   On: 01/27/2021 11:11   DG Ankle Complete Left  Result Date: 01/29/2021 CLINICAL DATA:  Left foot and ankle pain due to fall yesterday EXAM: LEFT ANKLE COMPLETE - 3+ VIEW COMPARISON:  None. FINDINGS: There is no evidence of fracture, dislocation, or joint effusion. There is no evidence of arthropathy or other focal bone abnormality. Soft tissues are unremarkable. IMPRESSION: Negative. Electronically Signed   By: Jorje Guild M.D.   On: 01/29/2021 09:34   DG Abd 1 View  Result Date: 01/27/2021 CLINICAL DATA:  Pre MRI screening EXAM: ABDOMEN - 1 VIEW COMPARISON:  01/14/2018 FINDINGS: Large stool burden within the colon. There is a non obstructive bowel gas pattern. No supine evidence of free air. No organomegaly or suspicious calcification. No acute bony abnormality. No radiopaque foreign body. IMPRESSION: Large stool burden.  No acute findings. Electronically Signed   By: Rolm Baptise M.D.   On: 01/27/2021 11:12   MR LUMBAR SPINE WO CONTRAST  Result Date: 01/27/2021 CLINICAL DATA:  Chronic bilateral low back pain with bilateral sciatica M54.42, M54.41, G89.29 (ICD-10-CM) Complaints of leg weakness R29.898 (ICD-10-CM) EXAM: MRI LUMBAR SPINE WITHOUT CONTRAST TECHNIQUE: Multiplanar, multisequence MR imaging of the lumbar spine was performed. No intravenous contrast was administered. COMPARISON:  Lumbar radiographs 12 01/02/2014. FINDINGS: Segmentation: Transitional lumbosacral anatomy. For the purposes of this dictation, there is lumbarization of S1 and small ribs at L1. Alignment:  Normal. 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 No evidence of acute fracture or discitis/osteomyelitis. No suspicious bone lesions. Conus medullaris and cauda equina: Conus extends to the superior L2 level.  Conus appears normal. Paraspinal and other soft tissues: Unremarkable. Disc levels: T12-L1: No significant disc protrusion, foraminal stenosis, or canal stenosis. L1-L2: No significant disc protrusion, foraminal stenosis, or canal stenosis. L2-L3: No significant disc protrusion, foraminal stenosis, or canal stenosis. L3-L4: No significant disc protrusion, foraminal stenosis, or canal stenosis. L4-L5: No significant disc protrusion, foraminal stenosis, or canal stenosis. Mild bilateral facet arthropathy. L5-S1: Bilateral L5 pars defects. Moderate bilateral facet arthropathy. Mild disc bulging, mildly eccentric to the right. Resulting mild right foraminal stenosis without significant canal stenosis. S1-S2: Transitional anatomy. No significant canal or foraminal stenosis. IMPRESSION: 1. Transitional lumbosacral anatomy. For the purposes of this dictation, there is lumbarization of S1 and small ribs at L1. Correlation with radiographs is recommended prior to any operative intervention. 2. Chronic bilateral L5 pars defects. Resulting moderate bilateral facet arthropathy at this level with mild right L5-S1 foraminal stenosis. No sagittal subluxation. Electronically Signed   By: Margaretha Sheffield M.D.   On: 01/27/2021 17:15   DG Foot Complete Left  Result Date: 01/29/2021 CLINICAL DATA:  Posttraumatic left foot and ankle pain due to fall yesterday EXAM: LEFT FOOT - COMPLETE 3+ VIEW COMPARISON:  None. FINDINGS: There is no evidence of fracture or dislocation. There is no evidence of arthropathy or other focal bone abnormality. Soft tissues are unremarkable. IMPRESSION: Negative. Electronically Signed   By: Roderic Palau  Watts M.D.   On: 01/29/2021 09:33    Procedures Procedures (including critical care time)  Medications Ordered in UC Medications - No data to display  Initial Impression / Assessment and Plan / UC Course  I have reviewed the triage vital signs and the nursing notes.  Pertinent labs & imaging  results that were available during my care of the patient were reviewed by me and considered in my medical decision making (see chart for details).  30 year old female presenting for left foot/ankle pain following an accidental fall yesterday.  On exam patient has mild swelling of the lateral ankle.  She has tenderness in multiple areas including lateral and medial malleoli, ATFL and base of fifth metatarsal.  Patient's pain does seem out of proportion to exam/condition.  As mentioned above she pulls away, winces and whines with light touch to any region of ankle that is palpated.  Good pulses.  X-ray obtained today of foot and ankle independently viewed by me.  X-rays do not show any fractures.  Suspect sprain.  Patient given lace up ankle brace and advised to use crutches.  Reviewed RICE guidelines.  Sent in naproxen and advised her she can also continue with the Tylenol.  Advised patient to follow-up with her orthopedist if still having pain.  Final Clinical Impressions(s) / UC Diagnoses   Final diagnoses:  Sprain of left ankle, unspecified ligament, initial encounter  Acute left ankle pain     Discharge Instructions      SPRAIN: X-rays are normal. Stressed avoiding painful activities . Reviewed RICE guidelines. Use medications as directed, including NSAIDs. If no NSAIDs have been prescribed for you today, you may take Aleve or Motrin over the counter. May use Tylenol in between doses of NSAIDs.  If no improvement in the next 1-2 weeks, f/u with your orthopedist.   I have sent in Naproxen. Ok to take Tylenol with it, but don't take other NSAIDs (Meloxicam, Advil, Motrin, Aleve)     ED Prescriptions     Medication Sig Dispense Auth. Provider   naproxen (NAPROSYN) 500 MG tablet Take 1 tablet (500 mg total) by mouth 2 (two) times daily with a meal for 10 days. 20 tablet Danton Clap, PA-C      I have reviewed the PDMP during this encounter.   Danton Clap, PA-C 01/29/21 1018

## 2021-01-29 NOTE — Discharge Instructions (Signed)
SPRAIN: X-rays are normal. Stressed avoiding painful activities . Reviewed RICE guidelines. Use medications as directed, including NSAIDs. If no NSAIDs have been prescribed for you today, you may take Aleve or Motrin over the counter. May use Tylenol in between doses of NSAIDs.  If no improvement in the next 1-2 weeks, f/u with your orthopedist.   I have sent in Naproxen. Ok to take Tylenol with it, but don't take other NSAIDs (Meloxicam, Advil, Motrin, Aleve)

## 2021-01-29 NOTE — ED Triage Notes (Signed)
Patient states that she was going up the steps outside her house yesterday and her knee gave out and she fell and injured her left ankle.

## 2021-03-05 ENCOUNTER — Encounter: Payer: Self-pay | Admitting: Internal Medicine

## 2021-03-05 ENCOUNTER — Ambulatory Visit (INDEPENDENT_AMBULATORY_CARE_PROVIDER_SITE_OTHER): Payer: Medicare Other | Admitting: Internal Medicine

## 2021-03-05 ENCOUNTER — Other Ambulatory Visit: Payer: Self-pay

## 2021-03-05 VITALS — BP 110/78 | HR 75 | Temp 98.5°F | Resp 18 | Ht 66.0 in | Wt 295.6 lb

## 2021-03-05 DIAGNOSIS — M25562 Pain in left knee: Secondary | ICD-10-CM

## 2021-03-05 DIAGNOSIS — G8929 Other chronic pain: Secondary | ICD-10-CM

## 2021-03-05 DIAGNOSIS — M25561 Pain in right knee: Secondary | ICD-10-CM | POA: Diagnosis not present

## 2021-03-05 DIAGNOSIS — Z23 Encounter for immunization: Secondary | ICD-10-CM

## 2021-03-05 DIAGNOSIS — E162 Hypoglycemia, unspecified: Secondary | ICD-10-CM | POA: Diagnosis not present

## 2021-03-05 DIAGNOSIS — G43009 Migraine without aura, not intractable, without status migrainosus: Secondary | ICD-10-CM | POA: Diagnosis not present

## 2021-03-05 DIAGNOSIS — K219 Gastro-esophageal reflux disease without esophagitis: Secondary | ICD-10-CM

## 2021-03-05 NOTE — Progress Notes (Signed)
Date:  03/05/2021   Name:  Melanie Bell   DOB:  October 30, 1990   MRN:  [id number]   Chief Complaint: Hypoglycemia (Bs averaging in the low 100's about 104-105 )  Gastroesophageal Reflux She complains of heartburn. She reports no abdominal pain, no chest pain or no coughing. The problem has been gradually improving. The heartburn duration is less than a minute. Pertinent negatives include no fatigue. She has tried a PPI for the symptoms. The treatment provided significant relief.  Migraine  This is a recurrent problem. The problem has been gradually improving. The pain is located in the Bilateral region. Pertinent negatives include no abdominal pain, coughing, fever, numbness or tinnitus. Treatments tried: Neurology started on Baclofen. The treatment provided moderate relief.  Knee Pain  Pertinent negatives include no numbness.  Hypoglycemia - Much improved with diet changes, more protein and less sugar and carbs.  She had one episode of glucose 70 which responded to juice.  Otherwise readings in the low 100's. Lab Results  Component Value Date   CREATININE 0.71 04/24/2020   BUN 17 04/24/2020   NA 139 04/24/2020   K 4.0 04/24/2020   CL 104 04/24/2020   CO2 27 04/24/2020   Lab Results  Component Value Date   CHOL 142 07/01/2019   HDL 50 07/01/2019   LDLCALC 79 07/01/2019   TRIG 63 07/01/2019   CHOLHDL 2.8 07/01/2019   Lab Results  Component Value Date   TSH 4.07 10/28/2020   Lab Results  Component Value Date   HGBA1C 5.1 11/23/2017   Lab Results  Component Value Date   WBC 5.9 02/09/2018   HGB 12.3 02/09/2018   HCT 36.0 02/09/2018   MCV 91.6 02/09/2018   PLT 205 02/09/2018   Lab Results  Component Value Date   ALT 7 07/31/2019   AST 16 07/31/2019   ALKPHOS 74 07/31/2019   BILITOT 0.2 07/31/2019     Review of Systems  Constitutional:  Negative for appetite change, fatigue, fever and unexpected weight change.  HENT:  Negative for tinnitus and trouble  swallowing.   Eyes:  Negative for visual disturbance.  Respiratory:  Negative for cough, chest tightness and shortness of breath.   Cardiovascular:  Negative for chest pain, palpitations and leg swelling.  Gastrointestinal:  Positive for heartburn. Negative for abdominal pain.  Endocrine: Negative for polydipsia and polyuria.  Genitourinary:  Negative for dysuria and hematuria.  Musculoskeletal:  Positive for arthralgias (both knees improved since cortisone injections).  Neurological:  Positive for headaches. Negative for tremors and numbness.  Psychiatric/Behavioral:  Negative for dysphoric mood.    Patient Active Problem List   Diagnosis Date Noted   Mild intermittent asthma without complication 47/01/6282   Gastroesophageal reflux disease 12/01/2020   Hypoglycemia 12/01/2020   History of hypoglycemia 04/26/2020   History of hyperprolactinemia 04/26/2020   Rheumatoid factor positive 12/09/2019   SS-A antibody positive 10/28/2019   Chronic pain of both knees 10/28/2019   Subclinical hypothyroidism 08/02/2019   Hyperprolactinemia ([place]) 08/02/2019   Schizoaffective disorder, depressive type ([place]) 02/02/2018   Suicide attempt ([place]) 01/16/2018   Dysfunctional uterine bleeding 01/06/2018   Borderline personality disorder ([place]) 01/04/2018   PTSD (post-traumatic stress disorder) 01/03/2018   ASCUS of cervix with negative high risk HPV 08/28/2017   Malingering 05/06/2016   Breast tumor 03/05/2016   Schizoaffective disorder, bipolar type ([place]) 11/06/2014   Hypertension 09/29/2014    Allergies  Allergen Reactions   Betadine [Povidone Iodine] Other (See Comments)  Reaction:  Unknown    Fish Allergy Hives   Iodine Rash   Shellfish-Derived Products Other (See Comments)    Reaction:  Unknown     Past Surgical History:  Procedure Laterality Date   ABDOMINAL SURGERY     "years ago" to remove foreign objects   APPENDECTOMY     BREAST EXCISIONAL BIOPSY Right 09/03/2007   surgical  bx removed fibroadeoma   BREAST LUMPECTOMY Right    COLONOSCOPY WITH PROPOFOL N/A 09/10/2015   Procedure: COLONOSCOPY WITH PROPOFOL;  Surgeon: [name], MD;  Location: [facility] ENDOSCOPY;  Service: Endoscopy;  Laterality: N/A;   ESOPHAGOGASTRODUODENOSCOPY N/A 11/28/2014   Procedure: ESOPHAGOGASTRODUODENOSCOPY (EGD);  Surgeon: [name], MD;  Location: [facility] ENDOSCOPY;  Service: Endoscopy;  Laterality: N/A;   ESOPHAGOGASTRODUODENOSCOPY N/A 02/21/2016   Procedure: ESOPHAGOGASTRODUODENOSCOPY (EGD);  Surgeon: Mauri Pole, MD;  Location: [facility] ENDOSCOPY;  Service: Endoscopy;  Laterality: N/A;   ESOPHAGOGASTRODUODENOSCOPY N/A 04/21/2016   Procedure: ESOPHAGOGASTRODUODENOSCOPY (EGD);  Surgeon: Gatha Mayer, MD;  Location: [facility] ENDOSCOPY;  Service: Endoscopy;  Laterality: N/A;   ESOPHAGOGASTRODUODENOSCOPY N/A 05/06/2016   Procedure: ESOPHAGOGASTRODUODENOSCOPY (EGD);  Surgeon: Jonathon Bellows, MD;  Location: [facility] ENDOSCOPY;  Service: Gastroenterology;  Laterality: N/A;   ESOPHAGOGASTRODUODENOSCOPY N/A 06/13/2016   Procedure: ESOPHAGOGASTRODUODENOSCOPY (EGD);  Surgeon: [name], MD;  Location: [facility] ENDOSCOPY;  Service: Endoscopy;  Laterality: N/A;   ESOPHAGOGASTRODUODENOSCOPY (EGD) WITH PROPOFOL N/A 02/29/2016   Procedure: ESOPHAGOGASTRODUODENOSCOPY (EGD) WITH PROPOFOL;  Surgeon: [name], MD;  Location: [facility] ENDOSCOPY;  Service: Endoscopy;  Laterality: N/A;   ESOPHAGOGASTRODUODENOSCOPY (EGD) WITH PROPOFOL N/A 01/15/2018   Procedure: ESOPHAGOGASTRODUODENOSCOPY (EGD) WITH PROPOFOL;  Surgeon: Daneil Dolin, MD;  Location: [facility] ENDO SUITE;  Service: Endoscopy;  Laterality: N/A;  retrieval forein body   LAPAROTOMY N/A 09/12/2015   Procedure: EXPLORATORY LAPAROTOMY;  Surgeon: Florene Glen, MD;  Location: [facility] ORS;  Service: General;  Laterality: N/A;   SIGMOIDOSCOPY N/A 09/12/2015   Procedure: Lonell Face;  Surgeon: Florene Glen, MD;  Location: [facility] ORS;  Service: General;   Laterality: N/A;   WISDOM TOOTH EXTRACTION      Social History   Tobacco Use   Smoking status: Former    Packs/day: 0.50    Years: 3.00    Pack years: 1.50    Types: Cigarettes    Quit date: 08/14/2016    Years since quitting: 4.5   Smokeless tobacco: Never  Vaping Use   Vaping Use: Never used  Substance Use Topics   Alcohol use: No   Drug use: No     Medication list has been reviewed and updated.  Current Meds  Medication Sig   albuterol (VENTOLIN HFA) 108 (90 Base) MCG/ACT inhaler 2 puffs four times a day as needed   baclofen (LIORESAL) 10 MG tablet Take 10 mg by mouth 2 (two) times daily.   escitalopram (LEXAPRO) 5 MG tablet Take 5 mg by mouth every morning.   hydrOXYzine (VISTARIL) 50 MG capsule Take 50 mg by mouth 2 (two) times daily as needed.   levothyroxine (SYNTHROID) 75 MCG tablet Take 1 tablet (75 mcg total) by mouth daily.   lithium carbonate 300 MG capsule Take 1 capsule (300 mg total) by mouth 2 (two) times daily with a meal.   meloxicam (MOBIC) 7.5 MG tablet Take 7.5 mg by mouth daily.   omeprazole (PRILOSEC) 20 MG capsule TAKE 1 CAPSULE (20 MG TOTAL) BY MOUTH DAILY.   paliperidone (INVEGA) 6 MG 24 hr tablet Take 6 mg by mouth every morning.  prazosin (MINIPRESS) 2 MG capsule Take 1 capsule (2 mg total) by mouth at bedtime.    PHQ 2/9 Scores 01/04/2021 12/01/2020  PHQ - 2 Score 0 0  PHQ- 9 Score 0 0    GAD 7 : Generalized Anxiety Score 12/01/2020  Nervous, Anxious, on Edge 0  Control/stop worrying 0  Worry too much - different things 0  Trouble relaxing 0  Restless 0  Easily annoyed or irritable 0  Afraid - awful might happen 0  Total GAD 7 Score 0  Anxiety Difficulty Not difficult at all    BP Readings from Last 3 Encounters:  03/05/21 110/78  01/29/21 116/78  12/01/20 134/80    Physical Exam Vitals and nursing note reviewed.  Constitutional:      General: She is not in acute distress.    Appearance: She is well-developed.  HENT:      Head: Normocephalic and atraumatic.  Cardiovascular:     Rate and Rhythm: Normal rate and regular rhythm.     Pulses: Normal pulses.     Heart sounds: No murmur heard. Pulmonary:     Effort: Pulmonary effort is normal. No respiratory distress.     Breath sounds: No wheezing or rhonchi.  Musculoskeletal:     Cervical back: Normal range of motion.     Right lower leg: No edema.     Left lower leg: No edema.  Skin:    General: Skin is warm and dry.     Capillary Refill: Capillary refill takes less than 2 seconds.     Findings: No rash.  Neurological:     General: No focal deficit present.     Mental Status: She is alert and oriented to person, place, and time.  Psychiatric:        Mood and Affect: Mood normal.        Behavior: Behavior normal.    Wt Readings from Last 3 Encounters:  03/05/21 295 lb 9.6 oz (134.1 kg)  01/29/21 296 lb 8.3 oz (134.5 kg)  12/01/20 296 lb 9.6 oz (134.5 kg)    BP 110/78 (BP Location: Right Arm, Patient Position: Sitting, Cuff Size: Large)   Pulse 75   Temp 98.5 F (36.9 C) (Oral)   Resp 18   Ht 5\' 6"  (1.676 m)   Wt 295 lb 9.6 oz (134.1 kg)   SpO2 98%   BMI 47.71 kg/m   Assessment and Plan: 1. Hypoglycemia Much improved with diet changes Will continue to monitor as needed  2. Migraine without aura and without status migrainosus, not intractable Now seeing Neurology On Baclofen Additional genetic testing planned due to abnormalities found on MRI Brain.  3. Need for immunization against influenza - Flu Vaccine QUAD 6+ mos PF IM (Fluarix Quad PF)  4. Chronic pain of both knees S/p bilateral joint injections. Now able to ambulate without a walker   Partially dictated using Editor, commissioning. Any errors are unintentional.  Halina Maidens, MD [facility]  03/05/2021

## 2021-03-09 ENCOUNTER — Other Ambulatory Visit: Payer: Self-pay | Admitting: Sports Medicine

## 2021-03-09 DIAGNOSIS — G8929 Other chronic pain: Secondary | ICD-10-CM

## 2021-03-09 DIAGNOSIS — M25861 Other specified joint disorders, right knee: Secondary | ICD-10-CM

## 2021-03-15 ENCOUNTER — Ambulatory Visit: Payer: Medicare Other | Admitting: Physical Therapy

## 2021-03-17 ENCOUNTER — Other Ambulatory Visit: Payer: Self-pay | Admitting: Internal Medicine

## 2021-03-17 DIAGNOSIS — K219 Gastro-esophageal reflux disease without esophagitis: Secondary | ICD-10-CM

## 2021-03-18 ENCOUNTER — Other Ambulatory Visit: Payer: Self-pay

## 2021-03-18 ENCOUNTER — Encounter: Payer: Self-pay | Admitting: Physical Therapy

## 2021-03-18 ENCOUNTER — Ambulatory Visit: Payer: Medicare Other | Attending: Sports Medicine | Admitting: Physical Therapy

## 2021-03-18 ENCOUNTER — Ambulatory Visit: Payer: Medicare Other | Admitting: Physical Therapy

## 2021-03-18 DIAGNOSIS — M25561 Pain in right knee: Secondary | ICD-10-CM | POA: Diagnosis present

## 2021-03-18 DIAGNOSIS — G8929 Other chronic pain: Secondary | ICD-10-CM | POA: Insufficient documentation

## 2021-03-18 DIAGNOSIS — M6281 Muscle weakness (generalized): Secondary | ICD-10-CM | POA: Insufficient documentation

## 2021-03-18 DIAGNOSIS — R262 Difficulty in walking, not elsewhere classified: Secondary | ICD-10-CM | POA: Diagnosis present

## 2021-03-18 DIAGNOSIS — M25562 Pain in left knee: Secondary | ICD-10-CM | POA: Insufficient documentation

## 2021-03-18 DIAGNOSIS — Z9181 History of falling: Secondary | ICD-10-CM | POA: Diagnosis present

## 2021-03-18 NOTE — Therapy (Addendum)
[place] [facility] [address] [place], [place], [address] Phone: [phone]   Fax:  [phone]  Physical Therapy Evaluation  Patient Details  [name] MRN: [id number] Date of Birth: 08-Jan-1991 Referring Provider (PT): Rosalia Hammers, MD  Encounter Date: 03/18/2021   PT End of Session - 04/24/21 0001     Visit Number 1    Number of Visits 21    Date for PT Re-Evaluation 05/27/21    Authorization Type Medicare/Medicaid, VL based on medical necessity, no auth    Progress Note Due on Visit 10    PT Start Time 0850    PT Stop Time 0955    PT Time Calculation (min) 65 min    Equipment Utilized During Treatment Gait belt;Other (comment)   front-wheel walker (patient has her own walker)   Activity Tolerance Patient limited by pain    Behavior During Therapy Anxious              Past Medical History:  Diagnosis Date   Allergy    Anxiety    Arthritis    Asthma    Depression    Foreign body aspiration    GERD (gastroesophageal reflux disease)    Hallucinations 09/30/2014   Sizoaffective   Hyperlipidemia    Hypertension    Intentional ingestion of batteries 04/21/2016    2 AAA batteries and 1 thumb tack; intent to hurt herself/notes 66/06/9474   Self-inflicted injury 54/65/0354   Suicidal ideation 01/02/2018   Tardive dyskinesia 10/2014   recent onset   Thyroid disease     Past Surgical History:  Procedure Laterality Date   ABDOMINAL SURGERY     "years ago" to remove foreign objects   APPENDECTOMY     BREAST EXCISIONAL BIOPSY Right 09/03/2007   surgical bx removed fibroadeoma   BREAST LUMPECTOMY Right    COLONOSCOPY WITH PROPOFOL N/A 09/10/2015   Procedure: COLONOSCOPY WITH PROPOFOL;  Surgeon: [name], MD;  Location: [facility] ENDOSCOPY;  Service: Endoscopy;  Laterality: N/A;   ESOPHAGOGASTRODUODENOSCOPY N/A 11/28/2014   Procedure: ESOPHAGOGASTRODUODENOSCOPY (EGD);  Surgeon: [name],  MD;  Location: [facility] ENDOSCOPY;  Service: Endoscopy;  Laterality: N/A;   ESOPHAGOGASTRODUODENOSCOPY N/A 02/21/2016   Procedure: ESOPHAGOGASTRODUODENOSCOPY (EGD);  Surgeon: Mauri Pole, MD;  Location: [facility] ENDOSCOPY;  Service: Endoscopy;  Laterality: N/A;   ESOPHAGOGASTRODUODENOSCOPY N/A 04/21/2016   Procedure: ESOPHAGOGASTRODUODENOSCOPY (EGD);  Surgeon: Gatha Mayer, MD;  Location: [facility] ENDOSCOPY;  Service: Endoscopy;  Laterality: N/A;   ESOPHAGOGASTRODUODENOSCOPY N/A 05/06/2016   Procedure: ESOPHAGOGASTRODUODENOSCOPY (EGD);  Surgeon: Jonathon Bellows, MD;  Location: [facility] ENDOSCOPY;  Service: Gastroenterology;  Laterality: N/A;   ESOPHAGOGASTRODUODENOSCOPY N/A 06/13/2016   Procedure: ESOPHAGOGASTRODUODENOSCOPY (EGD);  Surgeon: [name], MD;  Location: [facility] ENDOSCOPY;  Service: Endoscopy;  Laterality: N/A;   ESOPHAGOGASTRODUODENOSCOPY (EGD) WITH PROPOFOL N/A 02/29/2016   Procedure: ESOPHAGOGASTRODUODENOSCOPY (EGD) WITH PROPOFOL;  Surgeon: [name], MD;  Location: [facility] ENDOSCOPY;  Service: Endoscopy;  Laterality: N/A;   ESOPHAGOGASTRODUODENOSCOPY (EGD) WITH PROPOFOL N/A 01/15/2018   Procedure: ESOPHAGOGASTRODUODENOSCOPY (EGD) WITH PROPOFOL;  Surgeon: Daneil Dolin, MD;  Location: [facility] ENDO SUITE;  Service: Endoscopy;  Laterality: N/A;  retrieval forein body   LAPAROTOMY N/A 09/12/2015   Procedure: EXPLORATORY LAPAROTOMY;  Surgeon: Florene Glen, MD;  Location: [facility] ORS;  Service: General;  Laterality: N/A;   SIGMOIDOSCOPY N/A 09/12/2015   Procedure: Lonell Face;  Surgeon: Florene Glen, MD;  Location: [facility] ORS;  Service: General;  Laterality: N/A;   WISDOM TOOTH  EXTRACTION      There were no vitals filed for this visit.   Subjective Assessment - 04/24/21 0001     Subjective Patient is a 30 year old female with involved psych medical history with primary complaint of chronic bilateral knee pain with gait instability and Hx of frequent falls.    Pertinent History Patient is a  30 year old female with involved psych medical history with primary complaint of chronic bilateral knee pain with gait instability and Hx of frequent falls. Patient has had multiple conservative treatments for knee pain including anti-inflammatories, injections. Hx of R ACL sprain and post-injury rehab. She states that recently injections helped for about 3 weeks. Patient reports notable "aching" behind her kneecap with prolonged sitting (pre-patellar pain). Patient reports ongoing pain and dysfunction since 2020. Patient has frequent falls. Patient reports about 10 falls over the last 6 months. She reports direct impact onto her knees often with her falls. Patient had isolated incident of numbness due to notable swelling after fall, but has not felt numbness or paresthesias recently. When questioned about neuro history, patient reports "changes in white matter" and associated migraines (present since [age] years old, have remained stable since then). Pt lives with her fiance. Patient has 3 steps to get into her home and pt uses railing. Patient reports she has to negotiate grass on level ground, but she can negotiate this well. Patient reports falls can happen with prolonged standing, walking, and functional reaching while standing. She states her R knee intermittently "gives out." Pt has continuous aching in her knees.    Limitations Standing;Sitting;House hold activities;Walking    Diagnostic tests MRI and knee radiograph - see below    Patient Stated Goals Improved ability to walk, less falls, improved knee pain                 [facility] PT Assessment - 04/24/21 0001       Assessment   Medical Diagnosis chronic pain of both knees, patellofemoral dysfunction of bilateral knees, gait instability, frequent falls.    Referring Provider (PT) Rosalia Hammers, MD    Onset Date/Surgical Date 03/21/19    Next MD Visit Not stated    Prior Therapy Prior PT for bilat knees, gait      Precautions    Precautions Fall    Precaution Comments R knee intermittently buckles   pt uses FWW     Prior Function   Level of Independence Independent with household mobility without device      Cognition   Overall Cognitive Status Within Functional Limits for tasks assessed               SUBJECTIVE Chief complaint:  Patient is a 30 year old female with involved psych medical history with primary complaint of chronic bilateral knee pain with gait instability and Hx of frequent falls.  Onset: Patient is a 30 year old female with involved psych medical history with primary complaint of chronic bilateral knee pain with gait instability and Hx of frequent falls. Patient has had multiple conservative treatments for knee pain including anti-inflammatories, injections. Hx of R ACL sprain and post-injury rehab. She states that recently injections helped for about 3 weeks. Patient reports notable "aching" behind her kneecap with prolonged sitting (pre-patellar pain). Patient reports ongoing pain and dysfunction since 2020. Patient has frequent falls. Patient reports about 10 falls over the last 6 months. She reports direct impact onto her knees often with her falls. Patient had isolated incident of numbness due to notable swelling after fall,  but has not felt numbness or paresthesias recently. When questioned about neuro history, patient reports "changes in white matter" and associated migraines (present since [age] years old, have remained stable since then). Pt lives with her fiance. Patient has 3 steps to get into her home and pt uses railing. Patient reports she has to negotiate grass on level ground, but she can negotiate this well. Patient reports falls can happen with prolonged standing, walking, and functional reaching while standing. She states her R knee intermittently "gives out." Pt has continuous aching in her knees.  Referring Dx: chronic pain of both knees, patellofemoral dysfunction of bilateral  knees, gait instability, frequent falls.  MD: Rosalia Hammers, MD  Follow-up appt with MD: after completion of PT Pain: 6/10 Present, 4/10 Best, 10/10 Worst: Aggravating factors: prolonged sitting, prolonged standing, squatting  Easing factors: ice, Voltaren, topical menthol 24 hour pain behavior: None, pain is activity-dependent Knee surgery: No Recent knee trauma: Yes, anterior knee direct impact with fall  Prior history of knee injury or pain: Yes, longstanding knee pain  Pain quality: pain quality: aching, throbbing; sharp pain after falls  Radiating symptoms: No  Numbness/Tingling: No  Imaging: Yes   Radiographs: R knee X-ray (09/01/20) demonstrated mild medial compartment degenerative change.   Upcoming MRI of bilateral knees is scheduled -old R knee MRI showed R ACL sprain  Lumbar spine MRI (01/27/21): IMPRESSION (per radiologist report): 1. Transitional lumbosacral anatomy. For the purposes of this dictation, there is lumbarization of S1 and small ribs at L1. Correlation with radiographs is recommended prior to any operative intervention. 2. Chronic bilateral L5 pars defects. Resulting moderate bilateral facet arthropathy at this level with mild right L5-S1 foraminal stenosis. No sagittal subluxation.    OBJECTIVE  MUSCULOSKELETAL: Tremor: Absent Bulk: Normal Tone: Normal, no spasticity, rigidity, or clonus No trophic changes noted to lower extremities. No ecchymosis or erythema noted around knee. Bilateral joint line edema is present. Bilateral genu valgum in non-weightbearing and weightbearing position.   Posture Increased thoracic thoracic kyphosis, forward head, rounded shoulders  Gait With FWW: heavy lean onto walker, decreased step length bilaterally, decreased single-limb support time, decreased heel strike at initial contact, genu valgum bilaterally  Without FWW: "cruising furniture" with nearby table, significantly shortened unipedal support time, mid-foot  initial contact  Palpation Tenderness to palpation and hyperalgesia grossly along medial and lateral patellar facets bilaterally, medial and lateral joint line bilaterally, quadriceps tendon bilaterally, patellar tendon bilaterally  Strength R/L 4-/4 Hip flexion 3+*/4 Hip external rotation 3+*/4 Hip internal rotation Not tested/not tested Hip abduction 4+/4+ Hip adduction 4-*/4 Knee extension 3+*/5 Knee flexion 5/5 Ankle Dorsiflexion 5/5 Ankle Plantarflexion 5/5 Ankle Inversion 5/5 Ankle Eversion *indicates pain  AROM  Knee R/L Flexion: 62/75  Extension: -10*/-7* *indicates pain  Knee PROM to grossly 90 deg bilaterally is noted during static sitting on edge of table   Hip [Unable to full assess hip mobility due to limited knee flexion PROM tolerated]   PROM Ankle Dorsiflexion: R 10*, L 15 *Indicates Pain  Passive Accessory Motion Knee: Significant flexion limitation in supine  Patella: Pain prior to mobility restriction in all planes bilaterally Ankle: WNL   NEUROLOGICAL:  Mental Status Patient is oriented to person, place and time.  Recent memory is intact.  Remote memory is intact.  Attention span and concentration are intact.  Expressive speech is intact.  Patient's fund of knowledge is within normal limits for educational level. Patient does appear anxious and fearful of movement/testing of lower extremities.  Sensation Deferred   SPECIAL TESTS  Patellar Grind: Positive bilaterally     FUNCTIONAL TESTS  Sit to stand: Positive for significant deviations - heavy upper limb support, weight shift to L and significant pain behaviors/grimacing   OUTCOME MEASURES  Timed Up and Go: 53.5 seconds ABC Scale: 30%      ASSESSMENT Pt is a 30 year old female referred for bilateral knee pain, gait instability, and history of falls with significant psych history. Pt does have notable R>L knee hyperalgesia that may be exacerbated by psychological  comorbidities and morbid obesity. Pt uses front-wheel walker for primary means of functional mobility at this time and has significant activity limitations with ambulation, transferring, functional reaching, and static standing activity. PT examination reveals deficits in bilateral knee ROM, decreased R ankle dorsiflexion mobility (non-specific R knee pain response), decreased hip and quad/hamstring strength, significant gait deviations, and anterior knee hyperaglesia R>L along peripatellar, infrapatellar, and suprapatellar region. Pt will benefit from PT services to address deficits in strength, mobility, and pain in order to return to full function at home and in community with less knee pain.         PT Short Term Goals - 03/18/21 1049       PT SHORT TERM GOAL #1   Title Pt will be independent with HEP in order to decrease knee pain and increase strength in order to improve pain-free function at home and work.    Baseline 03/18/21: HEP to be initiated next visit    Time 3    Period Weeks    Status New    Target Date 04/08/21      PT SHORT TERM GOAL #2   Title Patient will perform sit to stand from standard chair without significant UE support and no increase in baseline pain indicative of improved functional LE strength as needed to prevent fall during episode of LOB and improved ability to transfer    Baseline 03/18/21: Significant difficulty and heavy UE suppot with weight shift to LLE for transferring    Time 6    Period Weeks    Status New    Target Date 04/29/21               PT Long Term Goals - 04/22/21 1011       PT LONG TERM GOAL #1   Title Patient will demonstrate improved function as evidenced by a score of 57 on FOTO measure for full participation in activities at home and in the community.    Baseline 03/18/21: 47.     04/22/21: 67    Time 10    Period Weeks    Status Achieved    Target Date 05/27/21      PT LONG TERM GOAL #2   Title Pt will increase strength  of tested lower extremity musculature to at least 4+ MMT grade in order to demonstrate improvement in strength and function required for prolonged weightbearing activity, stair/step and obstacle negotiation    Baseline 03/18/21: Hip/knee musculature 3+ to 4- in RLE, grossly 4/5 in LLE.    Time 10    Period Weeks    Status New    Target Date 05/27/21      PT LONG TERM GOAL #3   Title Pt will decrease worst pain as reported on NPRS by at least 3 points in order to demonstrate clinically significant reduction in pain.    Baseline 03/18/21: Pain up to 10/10 at worst    Time 10  Period Weeks    Status New    Target Date 05/27/21      PT LONG TERM GOAL #4   Title Patient will ambulate up to 150 feet in hallway with least-restrictive assistive device with no LOB or lower extremity buckling and significant increase in baseline pain by 2 points or greater on NPRS - indicative of improved household-level and short-distance mobility    Baseline 03/18/21: Significant limitation with prolonged standing activity, gait deficits and notable pain with ambulation c front-wheeled walker    Time 10    Period Weeks    Status New    Target Date 05/27/21      PT LONG TERM GOAL #5   Title Patient will improve BERG by 3 points or greater indicative of decreased risk of falling and improved function for performing functional mobility ADLs and transferring    Baseline 03/18/21: BERG to be performed on visit # 2.  03/23/21: BERG 41/56    Time 10    Period Weeks    Status New    Target Date 05/27/21                    Plan - 04/24/21 0001     Clinical Impression Statement Pt is a 30 year old female referred for bilateral knee pain, gait instability, and history of falls with significant psych history. Pt does have notable R>L knee hyperalgesia that may be exacerbated by psychological comorbidities and morbid obesity. Pt uses front-wheel walker for primary means of functional mobility at this time and  has significant activity limitations with ambulation, transferring, functional reaching, and static standing activity. PT examination reveals deficits in bilateral knee ROM, decreased R ankle dorsiflexion mobility (non-specific R knee pain response), decreased hip and quad/hamstring strength, significant gait deviations, and anterior knee hyperaglesia R>L along peripatellar, infrapatellar, and suprapatellar region. Pt will benefit from PT services to address deficits in strength, mobility, and pain in order to return to full function at home and in community with less knee pain.    Personal Factors and Comorbidities Comorbidity 3+;Time since onset of injury/illness/exacerbation;Past/Current Experience;Behavior Pattern    Comorbidities Anxiety, depression, Hx of schizoaffective disorder, thyroid disease, obesity, chronic bilateral knee pain, GERD    Examination-Activity Limitations Bed Mobility;Dressing;Stand;Stairs;Bend;Sit;Transfers;Lift;Locomotion Level;Squat    Examination-Participation Restrictions Cleaning    Stability/Clinical Decision Making Unstable/Unpredictable    Rehab Potential Fair    PT Frequency 2x / week    PT Duration Other (comment)   10 weeks   PT Treatment/Interventions Cryotherapy;Electrical Stimulation;ADLs/Self Care Home Management;Gait training;Stair training;Functional mobility training;Therapeutic activities;Therapeutic exercise;Balance training;Neuromuscular re-education;Patient/family education;Manual techniques;Passive range of motion    PT Next Visit Plan Complete BERG and initiate home exercise program. Continued with pre-gait training, low-impact lower extremity isotonics with progression to closed-chain strengthening. Postural stability training and fall risk management.    PT Home Exercise Plan HEP to be initiated next visit    Consulted and Agree with Plan of Care Patient             Patient will benefit from skilled therapeutic intervention in order to improve  the following deficits and impairments:  Abnormal gait, Decreased activity tolerance, Decreased range of motion, Decreased strength, Hypomobility, Pain, Difficulty walking, Decreased balance  Visit Diagnosis: Chronic pain of both knees - Plan: PT plan of care cert/re-cert  Difficulty in walking, not elsewhere classified - Plan: PT plan of care cert/re-cert  Muscle weakness (generalized) - Plan: PT plan of care cert/re-cert  Personal history of fall - Plan:  PT plan of care cert/re-cert     Problem List Patient Active Problem List   Diagnosis Date Noted   Migraine without aura and without status migrainosus, not intractable 03/05/2021   Mild intermittent asthma without complication 02/16/4960   Gastroesophageal reflux disease 12/01/2020   Hypoglycemia 12/01/2020   History of hypoglycemia 04/26/2020   History of hyperprolactinemia 04/26/2020   Rheumatoid factor positive 12/09/2019   SS-A antibody positive 10/28/2019   Chronic pain of both knees 10/28/2019   Subclinical hypothyroidism 08/02/2019   Hyperprolactinemia ([place]) 08/02/2019   Schizoaffective disorder, depressive type ([place]) 02/02/2018   Suicide attempt ([place]) 01/16/2018   Dysfunctional uterine bleeding 01/06/2018   Borderline personality disorder ([place]) 01/04/2018   PTSD (post-traumatic stress disorder) 01/03/2018   ASCUS of cervix with negative high risk HPV 08/28/2017   Malingering 05/06/2016   Breast tumor 03/05/2016   Schizoaffective disorder, bipolar type ([place]) 11/06/2014   Hypertension 09/29/2014   *Addendum for certification error only (Certification "To" Date)*  Valentina Gu, PT, DPT #[id number]  Eilleen Kempf, PT 04/24/2021, 10:35 AM  [facility] [address] [place], [place], [address] Phone: [phone]   Fax:  [phone]  [name] MRN: [id number] Date of Birth: 22-Sep-1990

## 2021-03-20 ENCOUNTER — Ambulatory Visit: Payer: Medicare Other

## 2021-03-23 ENCOUNTER — Encounter: Payer: Self-pay | Admitting: Physical Therapy

## 2021-03-23 ENCOUNTER — Other Ambulatory Visit: Payer: Self-pay

## 2021-03-23 ENCOUNTER — Ambulatory Visit: Payer: Medicare Other | Admitting: Physical Therapy

## 2021-03-23 DIAGNOSIS — M6281 Muscle weakness (generalized): Secondary | ICD-10-CM

## 2021-03-23 DIAGNOSIS — M25561 Pain in right knee: Secondary | ICD-10-CM | POA: Diagnosis not present

## 2021-03-23 DIAGNOSIS — G8929 Other chronic pain: Secondary | ICD-10-CM

## 2021-03-23 DIAGNOSIS — R262 Difficulty in walking, not elsewhere classified: Secondary | ICD-10-CM

## 2021-03-23 DIAGNOSIS — Z9181 History of falling: Secondary | ICD-10-CM

## 2021-03-23 NOTE — Therapy (Signed)
[facility] [address] [place], [place], [address] Phone: [phone]   Fax:  [phone]  Physical Therapy Treatment  Patient Details  [name] MRN: [id number] Date of Birth: 04/26/1991 Referring Provider (PT): Rosalia Hammers, MD   Encounter Date: 03/23/2021   PT End of Session - 03/24/21 1156     Visit Number 2    Number of Visits 21    Date for PT Re-Evaluation 05/27/21    Authorization Type Medicare/Medicaid, VL based on medical necessity, no auth    Progress Note Due on Visit 10    PT Start Time 0903    PT Stop Time 0950    PT Time Calculation (min) 47 min    Equipment Utilized During Treatment Gait belt;Other (comment)   front-wheel walker (patient has her own walker)   Activity Tolerance Patient limited by pain    Behavior During Therapy Anxious             Past Medical History:  Diagnosis Date   Allergy    Anxiety    Arthritis    Asthma    Depression    Foreign body aspiration    GERD (gastroesophageal reflux disease)    Hallucinations 09/30/2014   Sizoaffective   Hyperlipidemia    Hypertension    Intentional ingestion of batteries 04/21/2016    2 AAA batteries and 1 thumb tack; intent to hurt herself/notes 99/07/7167   Self-inflicted injury 67/89/3810   Suicidal ideation 01/02/2018   Tardive dyskinesia 10/2014   recent onset   Thyroid disease     Past Surgical History:  Procedure Laterality Date   ABDOMINAL SURGERY     "years ago" to remove foreign objects   APPENDECTOMY     BREAST EXCISIONAL BIOPSY Right 09/03/2007   surgical bx removed fibroadeoma   BREAST LUMPECTOMY Right    COLONOSCOPY WITH PROPOFOL N/A 09/10/2015   Procedure: COLONOSCOPY WITH PROPOFOL;  Surgeon: [name], MD;  Location: [facility] ENDOSCOPY;  Service: Endoscopy;  Laterality: N/A;   ESOPHAGOGASTRODUODENOSCOPY N/A 11/28/2014   Procedure: ESOPHAGOGASTRODUODENOSCOPY (EGD);  Surgeon: [name],  MD;  Location: [facility] ENDOSCOPY;  Service: Endoscopy;  Laterality: N/A;   ESOPHAGOGASTRODUODENOSCOPY N/A 02/21/2016   Procedure: ESOPHAGOGASTRODUODENOSCOPY (EGD);  Surgeon: Mauri Pole, MD;  Location: [facility] ENDOSCOPY;  Service: Endoscopy;  Laterality: N/A;   ESOPHAGOGASTRODUODENOSCOPY N/A 04/21/2016   Procedure: ESOPHAGOGASTRODUODENOSCOPY (EGD);  Surgeon: Gatha Mayer, MD;  Location: [facility] ENDOSCOPY;  Service: Endoscopy;  Laterality: N/A;   ESOPHAGOGASTRODUODENOSCOPY N/A 05/06/2016   Procedure: ESOPHAGOGASTRODUODENOSCOPY (EGD);  Surgeon: Jonathon Bellows, MD;  Location: [facility] ENDOSCOPY;  Service: Gastroenterology;  Laterality: N/A;   ESOPHAGOGASTRODUODENOSCOPY N/A 06/13/2016   Procedure: ESOPHAGOGASTRODUODENOSCOPY (EGD);  Surgeon: [name], MD;  Location: [facility] ENDOSCOPY;  Service: Endoscopy;  Laterality: N/A;   ESOPHAGOGASTRODUODENOSCOPY (EGD) WITH PROPOFOL N/A 02/29/2016   Procedure: ESOPHAGOGASTRODUODENOSCOPY (EGD) WITH PROPOFOL;  Surgeon: [name], MD;  Location: [facility] ENDOSCOPY;  Service: Endoscopy;  Laterality: N/A;   ESOPHAGOGASTRODUODENOSCOPY (EGD) WITH PROPOFOL N/A 01/15/2018   Procedure: ESOPHAGOGASTRODUODENOSCOPY (EGD) WITH PROPOFOL;  Surgeon: Daneil Dolin, MD;  Location: [facility] ENDO SUITE;  Service: Endoscopy;  Laterality: N/A;  retrieval forein body   LAPAROTOMY N/A 09/12/2015   Procedure: EXPLORATORY LAPAROTOMY;  Surgeon: Florene Glen, MD;  Location: [facility] ORS;  Service: General;  Laterality: N/A;   SIGMOIDOSCOPY N/A 09/12/2015   Procedure: Lonell Face;  Surgeon: Florene Glen, MD;  Location: [facility] ORS;  Service: General;  Laterality: N/A;   WISDOM TOOTH  EXTRACTION      There were no vitals filed for this visit.   Subjective Assessment - 03/23/21 0911     Subjective Patient has found out that she is pregnant recently. She has upcoming appointment with OB/GYN for this. She has stopped taking some medications (Lexapro, Meloxicam, Vistaril) due to this with recommendations from  her physician. Patient reports 4/10 pain this AM in both knees, R>L knee pain.    Pertinent History Patient is a 30 year old female with involved psych medical history with primary complaint of chronic bilateral knee pain with gait instability and Hx of frequent falls. Patient has had multiple conservative treatments for knee pain including anti-inflammatories, injections. Hx of R ACL sprain and post-injury rehab. She states that recently injections helped for about 3 weeks. Patient reports notable "aching" behind her kneecap with prolonged sitting (pre-patellar pain). Patient reports ongoing pain and dysfunction since 2020. Patient has frequent falls. Patient reports about 10 falls over the last 6 months. She reports direct impact onto her knees often with her falls. Patient had isolated incident of numbness due to notable swelling after fall, but has not felt numbness or paresthesias recently. When questioned about neuro history, patient reports "changes in white matter" and associated migraines (present since [age] years old, have remained stable since then). Pt lives with her fiance. Patient has 3 steps to get into her home and pt uses railing. Patient reports she has to negotiate grass on level ground, but she can negotiate this well. Patient reports falls can happen with prolonged standing, walking, and functional reaching while standing. She states her R knee intermittently "gives out." Pt has continuous aching in her knees.    Limitations Standing;Sitting;House hold activities;Walking    Diagnostic tests MRI and knee radiograph - see below    Patient Stated Goals Improved ability to walk, less falls, improved knee pain                [facility] PT Assessment - 03/24/21 0001       Standardized Balance Assessment   Standardized Balance Assessment Berg Balance Test      Berg Balance Test   Sit to Stand Able to stand  independently using hands    Standing Unsupported Able to stand 2 minutes with  supervision    Sitting with Back Unsupported but Feet Supported on Floor or Stool Able to sit safely and securely 2 minutes    Stand to Sit Sits independently, has uncontrolled descent    Transfers Able to transfer with verbal cueing and /or supervision    Standing Unsupported with Eyes Closed Able to stand 10 seconds safely    Standing Unsupported with Feet Together Able to place feet together independently and stand for 1 minute with supervision    From Standing, Reach Forward with Outstretched Arm Can reach confidently >25 cm (10")    From Standing Position, Pick up Object from Floor Able to pick up shoe, needs supervision    From Standing Position, Turn to Look Behind Over each Shoulder Looks behind from both sides and weight shifts well    Turn 360 Degrees Needs close supervision or verbal cueing    Standing Unsupported, Alternately Place Feet on Step/Stool Able to complete >2 steps/needs minimal assist    Standing Unsupported, One Foot in Front Able to place foot tandem independently and hold 30 seconds    Standing on One Leg Able to lift leg independently and hold > 10 seconds    Total Score 41  TREATMENT  Neuromuscular Re-education - for nervous system downregulation, gluteal musculature activation and exercises to promote LE kinetic chain stability  BERG performance (see test/Flowsheet above). Patient education on results of BERG and goals of PT to address specific deficits noted in balance scale   Therapeutic Exercise - for improved soft tissue flexibility and extensibility as needed for ROM, improved strength as needed to improve performance of CKC activities/functional movements  NuStep x 5 minutes, for knee AAROM and warm-up for improved soft tissue mobility/extensibility and muscle performance - subjective information gathered during this time  Quad set; 2x10, 5 sec, bilateral Seated knee extension; 2x10, 5 sec, bilateral  HEP initiation and review of  HEP printout      ASSESSMENT Patient arrives with excellent motivation to participate in physical therapy. She has just found out she is pregnant since last week and has upcoming follow-up with OB/GYN. Patient's due date/number of weeks pregnant is unknown at this time. She demonstrates ongoing significant knee flexion deficit bilaterally. She demonstrates sound static postural control and Tandem stance and unipedal stance, but she has intermittent RLE buckling that increases patient's risk of future falls. Pt tolerates only low volume of standing activity in single bout. 41/56 BERG today. Patient has remaining deficits in bilateral knee ROM, decreased R ankle dorsiflexion mobility (non-specific R knee pain response), decreased hip and quad/hamstring strength, significant gait deviations, and anterior knee hyperaglesia. Patient will benefit from continued skilled therapeutic intervention to address the above deficits as needed for improved function and QoL.        PT Education - 03/24/21 1154     Education Details Educated patient on safety with exercise in pregnancy in absence of high-risk pregnancy. Reviewed recent changes to medications as directed by her physician.    Person(s) Educated Patient    Methods Explanation    Comprehension Verbalized understanding              PT Short Term Goals - 03/18/21 1049       PT SHORT TERM GOAL #1   Title Pt will be independent with HEP in order to decrease knee pain and increase strength in order to improve pain-free function at home and work.    Baseline 03/18/21: HEP to be initiated next visit    Time 3    Period Weeks    Status New    Target Date 04/08/21      PT SHORT TERM GOAL #2   Title Patient will perform sit to stand from standard chair without significant UE support and no increase in baseline pain indicative of improved functional LE strength as needed to prevent fall during episode of LOB and improved ability to transfer     Baseline 03/18/21: Significant difficulty and heavy UE suppot with weight shift to LLE for transferring    Time 6    Period Weeks    Status New    Target Date 04/29/21               PT Long Term Goals - 03/18/21 1050       PT LONG TERM GOAL #1   Title Patient will demonstrate improved function as evidenced by a score of 57 on FOTO measure for full participation in activities at home and in the community.    Baseline 03/18/21: 47    Time 10    Period Weeks    Status New    Target Date 05/27/21      PT LONG TERM GOAL #2  Title Pt will increase strength of tested lower extremity musculature to at least 4+ MMT grade in order to demonstrate improvement in strength and function required for prolonged weightbearing activity, stair/step and obstacle negotiation    Baseline 03/18/21: Hip/knee musculature 3+ to 4- in RLE, grossly 4/5 in LLE.    Time 10    Period Weeks    Status New    Target Date 05/27/21      PT LONG TERM GOAL #3   Title Pt will decrease worst pain as reported on NPRS by at least 3 points in order to demonstrate clinically significant reduction in pain.    Baseline 03/18/21: Pain up to 10/10 at worst    Time 10    Period Weeks    Status New    Target Date 05/27/21      PT LONG TERM GOAL #4   Title Patient will ambulate up to 150 feet in hallway with least-restrictive assistive device with no LOB or lower extremity buckling and significant increase in baseline pain by 2 points or greater on NPRS - indicative of improved household-level and short-distance mobility    Baseline 03/18/21: Significant limitation with prolonged standing activity, gait deficits and notable pain with ambulation c front-wheeled walker    Time 10    Period Weeks    Status New    Target Date 05/27/21      PT LONG TERM GOAL #5   Title Patient will improve BERG by 3 points or greater indicative of decreased risk of falling and improved function for performing functional mobility ADLs and  transferring    Baseline 03/18/21: BERG to be performed on visit # 2    Time 10    Period Weeks    Status New    Target Date 05/27/21                   Plan - 03/24/21 1203     Clinical Impression Statement Patient arrives with excellent motivation to participate in physical therapy. She has just found out she is pregnant since last week and has upcoming follow-up with OB/GYN. Patient's due date/number of weeks pregnant is unknown at this time. She demonstrates ongoing significant knee flexion deficit bilaterally. She demonstrates sound static postural control and Tandem stance and unipedal stance, but she has intermittent RLE buckling that increases patient's risk of future falls. Pt tolerates only low volume of standing activity in single bout. 41/56 BERG today. Patient has remaining deficits in bilateral knee ROM, decreased R ankle dorsiflexion mobility (non-specific R knee pain response), decreased hip and quad/hamstring strength, significant gait deviations, and anterior knee hyperaglesia. Patient will benefit from continued skilled therapeutic intervention to address the above deficits as needed for improved function and QoL.    Personal Factors and Comorbidities Comorbidity 3+;Time since onset of injury/illness/exacerbation;Past/Current Experience;Behavior Pattern    Comorbidities Anxiety, depression, Hx of schizoaffective disorder, thyroid disease, obesity, chronic bilateral knee pain, GERD    Examination-Activity Limitations Bed Mobility;Dressing;Stand;Stairs;Bend;Sit;Transfers;Lift;Locomotion Level;Squat    Examination-Participation Restrictions Cleaning    Stability/Clinical Decision Making Unstable/Unpredictable    Rehab Potential Fair    PT Frequency 2x / week    PT Duration Other (comment)   10 weeks   PT Treatment/Interventions Cryotherapy;Electrical Stimulation;ADLs/Self Care Home Management;Gait training;Stair training;Functional mobility training;Therapeutic  activities;Therapeutic exercise;Balance training;Neuromuscular re-education;Patient/family education;Manual techniques;Passive range of motion    PT Next Visit Plan Continued with pre-gait training, low-impact lower extremity isotonics with progression to closed-chain strengthening. Postural stability training and fall risk  management. Update HEP prn.    PT Home Exercise Plan HEP to be initiated next visit    Consulted and Agree with Plan of Care Patient             Patient will benefit from skilled therapeutic intervention in order to improve the following deficits and impairments:  Abnormal gait, Decreased activity tolerance, Decreased range of motion, Decreased strength, Hypomobility, Pain, Difficulty walking, Decreased balance  Visit Diagnosis: Chronic pain of both knees  Difficulty in walking, not elsewhere classified  Muscle weakness (generalized)  Personal history of fall     Problem List Patient Active Problem List   Diagnosis Date Noted   Migraine without aura and without status migrainosus, not intractable 03/05/2021   Mild intermittent asthma without complication 97/58/8325   Gastroesophageal reflux disease 12/01/2020   Hypoglycemia 12/01/2020   History of hypoglycemia 04/26/2020   History of hyperprolactinemia 04/26/2020   Rheumatoid factor positive 12/09/2019   SS-A antibody positive 10/28/2019   Chronic pain of both knees 10/28/2019   Subclinical hypothyroidism 08/02/2019   Hyperprolactinemia ([place]) 08/02/2019   Schizoaffective disorder, depressive type ([place]) 02/02/2018   Suicide attempt ([place]) 01/16/2018   Dysfunctional uterine bleeding 01/06/2018   Borderline personality disorder ([place]) 01/04/2018   PTSD (post-traumatic stress disorder) 01/03/2018   ASCUS of cervix with negative high risk HPV 08/28/2017   Malingering 05/06/2016   Breast tumor 03/05/2016   Schizoaffective disorder, bipolar type ([place]) 11/06/2014   Hypertension 09/29/2014   Valentina Gu,  PT, DPT #[id number]  Eilleen Kempf, PT 03/24/2021, 12:03 PM   [facility] [address] [place], [place], [address] Phone: [phone]   Fax:  [phone]  [name] MRN: [id number] Date of Birth: 1990-07-07

## 2021-03-24 ENCOUNTER — Telehealth: Payer: Self-pay | Admitting: Internal Medicine

## 2021-03-24 ENCOUNTER — Other Ambulatory Visit: Payer: Self-pay | Admitting: Internal Medicine

## 2021-03-24 ENCOUNTER — Encounter: Payer: Self-pay | Admitting: Internal Medicine

## 2021-03-24 DIAGNOSIS — E039 Hypothyroidism, unspecified: Secondary | ICD-10-CM

## 2021-03-24 NOTE — Telephone Encounter (Signed)
Please schedule the pt for the following    Lab test in 6 weeks  Office visit with me in 3 months   Thanks

## 2021-03-25 ENCOUNTER — Encounter: Payer: Self-pay | Admitting: Physical Therapy

## 2021-03-25 ENCOUNTER — Encounter: Payer: Self-pay | Admitting: Radiology

## 2021-03-25 ENCOUNTER — Ambulatory Visit: Payer: Medicare Other | Admitting: Physical Therapy

## 2021-03-25 ENCOUNTER — Ambulatory Visit
Admission: RE | Admit: 2021-03-25 | Discharge: 2021-03-25 | Disposition: A | Discharge status: Home / Self Care | Payer: Medicare Other | Source: Ambulatory Visit | Attending: Sports Medicine | Admitting: Sports Medicine

## 2021-03-25 ENCOUNTER — Other Ambulatory Visit: Payer: Self-pay

## 2021-03-25 DIAGNOSIS — G8929 Other chronic pain: Secondary | ICD-10-CM

## 2021-03-25 DIAGNOSIS — M25561 Pain in right knee: Secondary | ICD-10-CM | POA: Diagnosis present

## 2021-03-25 DIAGNOSIS — M25861 Other specified joint disorders, right knee: Secondary | ICD-10-CM | POA: Insufficient documentation

## 2021-03-25 DIAGNOSIS — M25562 Pain in left knee: Secondary | ICD-10-CM

## 2021-03-25 DIAGNOSIS — M6281 Muscle weakness (generalized): Secondary | ICD-10-CM

## 2021-03-25 DIAGNOSIS — R262 Difficulty in walking, not elsewhere classified: Secondary | ICD-10-CM

## 2021-03-25 DIAGNOSIS — Z9181 History of falling: Secondary | ICD-10-CM

## 2021-03-25 IMAGING — MR MR KNEE*R* W/O CM
9 series · 40 of 40 positions shown · non-contrast
Comparison: X-ray knee [DATE].

CLINICAL DATA: Right knee pain mainly behind patient's knee cap
with limited range of motion since [Q9]. No known injury. No history
of surgery reported.

EXAM:
MRI OF THE RIGHT KNEE WITHOUT CONTRAST
TECHNIQUE: Multiplanar, multisequence MR imaging of the knee was performed. No
intravenous contrast was administered.

[Series 5: T2 fat-sat · axial · right · 4.0mm · 0.62mm/px · z∈[-93,+32]mm · 4 of 26 slices shown (1 of 3)]
[im 1/26]
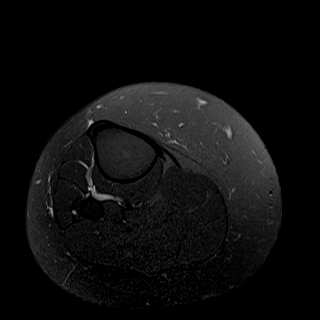
[im 9/26]
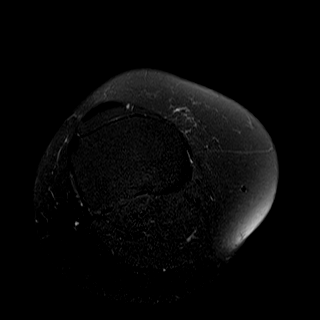
[im 17/26]
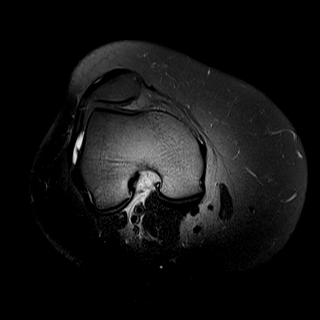
[im 26/26]
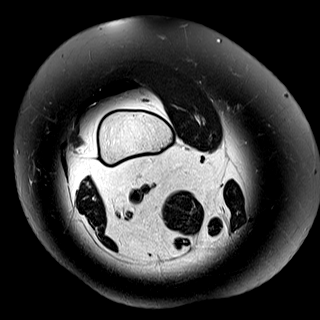

[Series 6: T2 fat-sat · coronal · right · 4.0mm · 0.66mm/px · 4 of 30 slices shown (2 of 3)]
[im 1/30]
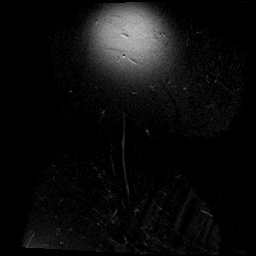
[im 10/30]
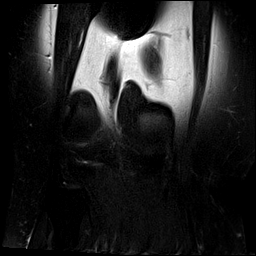
[im 20/30]
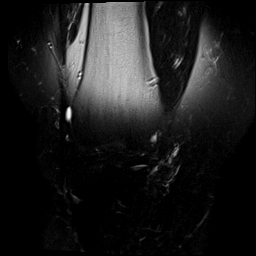
[im 30/30]
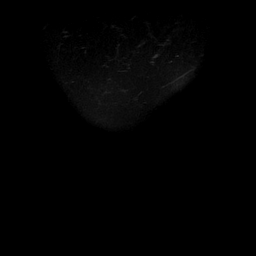

[Series 7: T1 · coronal · right · 4.0mm · 0.66mm/px · 5 of 32 slices shown]
[im 1/32]
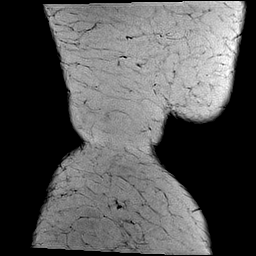
[im 8/32]
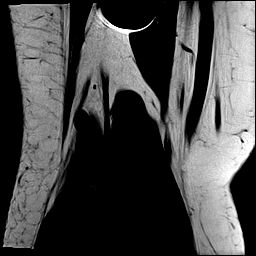
[im 16/32]
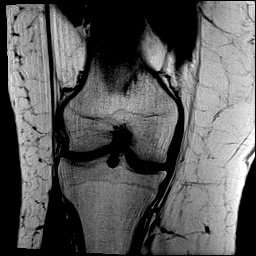
[im 24/32]
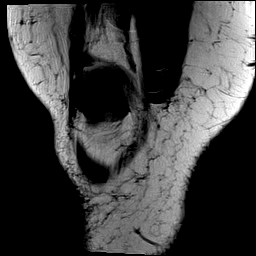
[im 32/32]
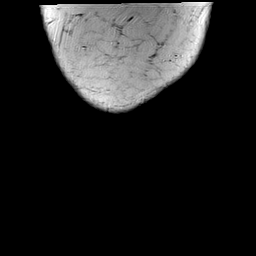

[Series 8: ax stir_high-bw · axial · right · 4.0mm · 0.66mm/px · z∈[-93,+32]mm · 4 of 26 slices shown]
[im 1/26]
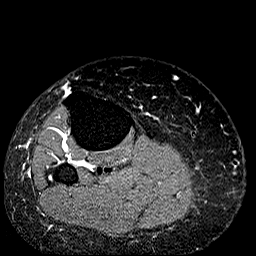
[im 9/26]
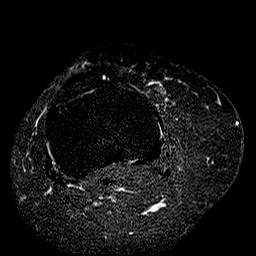
[im 17/26]
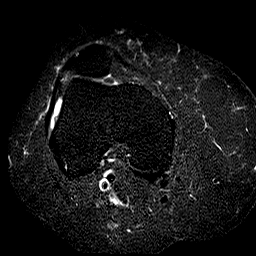
[im 26/26]
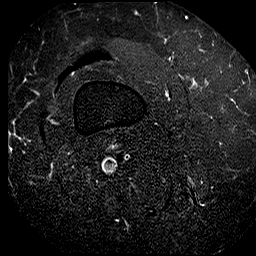

[Series 9: PD fat-sat · coronal · right · 4.0mm · 0.66mm/px · 4 of 30 slices shown (1 of 3)]
[im 1/30]
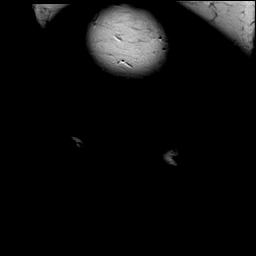
[im 10/30]
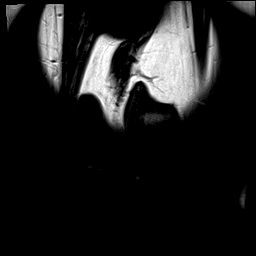
[im 20/30]
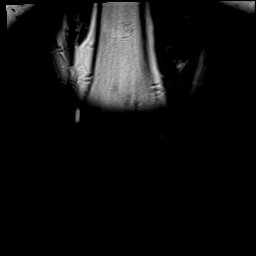
[im 30/30]
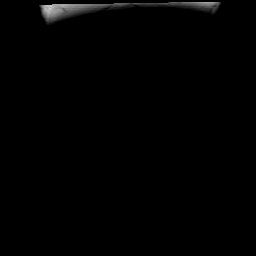

[Series 10: PD fat-sat · sagittal · right · 3.0mm · 0.66mm/px · 5 of 36 slices shown (2 of 3)]
[im 1/36]
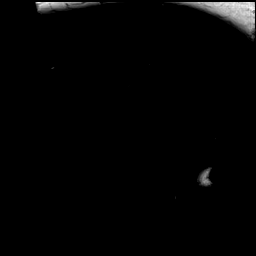
[im 9/36]
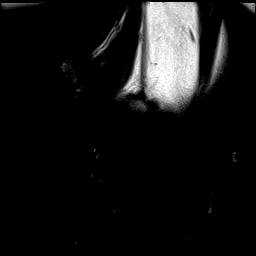
[im 18/36]
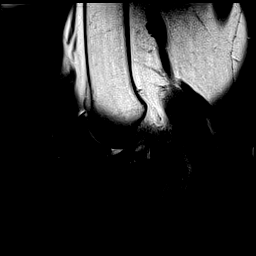
[im 27/36]
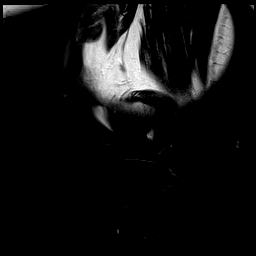
[im 36/36]
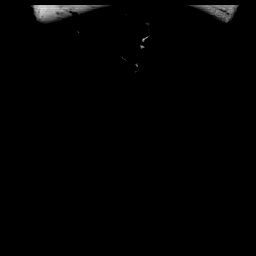

[Series 11: T2 fat-sat · sagittal · right · 3.0mm · 0.59mm/px · 5 of 36 slices shown (3 of 3)]
[im 1/36]
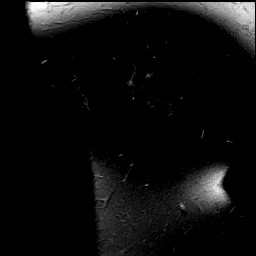
[im 9/36]
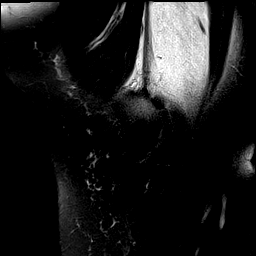
[im 18/36]
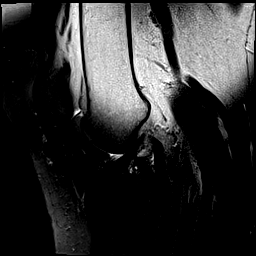
[im 27/36]
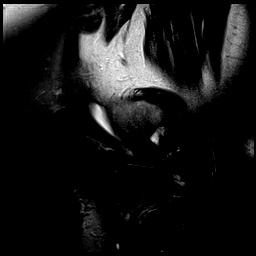
[im 36/36]
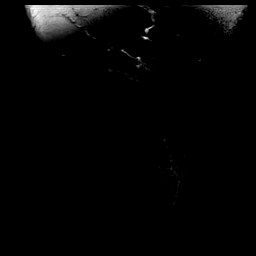

[Series 12: sag stir_high-bw · sagittal · right · 3.0mm · 0.59mm/px · 5 of 36 slices shown]
[im 1/36]
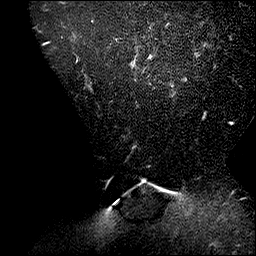
[im 9/36]
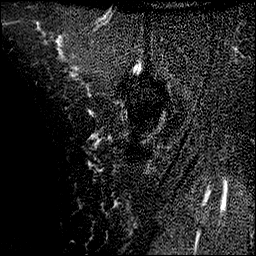
[im 18/36]
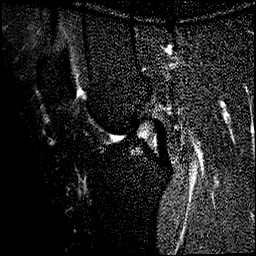
[im 27/36]
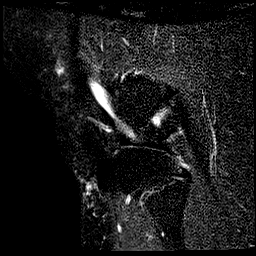
[im 36/36]
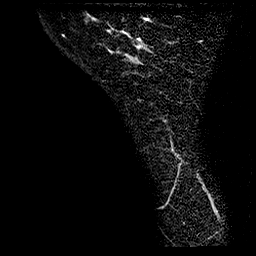

[Series 13: PD fat-sat · coronal · right · 4.0mm · 0.66mm/px · 4 of 30 slices shown (3 of 3)]
[im 1/30]
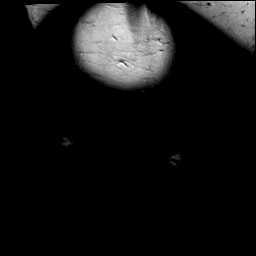
[im 10/30]
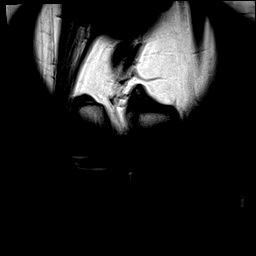
[im 20/30]
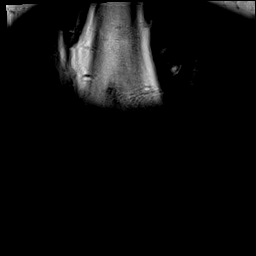
[im 30/30]
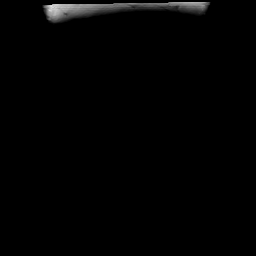

[40 of 40 positions shown; findings below may reference images not displayed]

FINDINGS: Limited evaluation secondary to body habitus degrading image
quality.

MENISCI

Medial meniscus:  Intact.

Lateral meniscus: Intact.

LIGAMENTS

Cruciates: Intact ACL and PCL.

Collaterals: Medial collateral ligament is intact. Lateral
collateral ligament complex is intact.

CARTILAGE

Patellofemoral: No chondral defect.

Medial: Partial-thickness cartilage loss of the medial femorotibial
compartment.

Lateral: Partial-thickness cartilage loss of the lateral
femorotibial compartment.

Joint: No joint effusion. Normal Hoffa's fat. No plical thickening.

Popliteal Fossa: No Baker's cyst.  Intact popliteus tendon.

Extensor Mechanism: Intact quadriceps tendon and patellofemoral
tendon.

Bones: No focal marrow signal abnormality. No fracture or
dislocation.

Other: None.
IMPRESSION: 1. No meniscal or ligamentous injury of the right knee.
2. Partial-thickness cartilage loss of the medial and lateral
femorotibial compartments.

## 2021-03-25 NOTE — Therapy (Signed)
[facility] [address] [place], [place], [address] Phone: [phone]   Fax:  [phone]  Physical Therapy Treatment  Patient Details  [name] MRN: [id number] Date of Birth: Sep 25, 1990 Referring Provider (PT): Rosalia Hammers, MD   Encounter Date: 03/25/2021   PT End of Session - 03/25/21 0828     Visit Number 3    Number of Visits 21    Date for PT Re-Evaluation 05/27/21    Authorization Type Medicare/Medicaid, VL based on medical necessity, no auth    Progress Note Due on Visit 10    PT Start Time [phone]    PT Stop Time 0925    PT Time Calculation (min) 47 min    Equipment Utilized During Treatment Gait belt;Other (comment)   front-wheel walker (patient has her own walker)   Activity Tolerance Patient limited by pain    Behavior During Therapy Anxious             Past Medical History:  Diagnosis Date   Allergy    Anxiety    Arthritis    Asthma    Depression    Foreign body aspiration    GERD (gastroesophageal reflux disease)    Hallucinations 09/30/2014   Sizoaffective   Hyperlipidemia    Hypertension    Intentional ingestion of batteries 04/21/2016    2 AAA batteries and 1 thumb tack; intent to hurt herself/notes 44/12/1854   Self-inflicted injury 31/49/7026   Suicidal ideation 01/02/2018   Tardive dyskinesia 10/2014   recent onset   Thyroid disease     Past Surgical History:  Procedure Laterality Date   ABDOMINAL SURGERY     "years ago" to remove foreign objects   APPENDECTOMY     BREAST EXCISIONAL BIOPSY Right 09/03/2007   surgical bx removed fibroadeoma   BREAST LUMPECTOMY Right    COLONOSCOPY WITH PROPOFOL N/A 09/10/2015   Procedure: COLONOSCOPY WITH PROPOFOL;  Surgeon: [name], MD;  Location: [facility] ENDOSCOPY;  Service: Endoscopy;  Laterality: N/A;   ESOPHAGOGASTRODUODENOSCOPY N/A 11/28/2014   Procedure: ESOPHAGOGASTRODUODENOSCOPY (EGD);  Surgeon: [name],  MD;  Location: [facility] ENDOSCOPY;  Service: Endoscopy;  Laterality: N/A;   ESOPHAGOGASTRODUODENOSCOPY N/A 02/21/2016   Procedure: ESOPHAGOGASTRODUODENOSCOPY (EGD);  Surgeon: Mauri Pole, MD;  Location: [facility] ENDOSCOPY;  Service: Endoscopy;  Laterality: N/A;   ESOPHAGOGASTRODUODENOSCOPY N/A 04/21/2016   Procedure: ESOPHAGOGASTRODUODENOSCOPY (EGD);  Surgeon: Gatha Mayer, MD;  Location: [facility] ENDOSCOPY;  Service: Endoscopy;  Laterality: N/A;   ESOPHAGOGASTRODUODENOSCOPY N/A 05/06/2016   Procedure: ESOPHAGOGASTRODUODENOSCOPY (EGD);  Surgeon: Jonathon Bellows, MD;  Location: [facility] ENDOSCOPY;  Service: Gastroenterology;  Laterality: N/A;   ESOPHAGOGASTRODUODENOSCOPY N/A 06/13/2016   Procedure: ESOPHAGOGASTRODUODENOSCOPY (EGD);  Surgeon: [name], MD;  Location: [facility] ENDOSCOPY;  Service: Endoscopy;  Laterality: N/A;   ESOPHAGOGASTRODUODENOSCOPY (EGD) WITH PROPOFOL N/A 02/29/2016   Procedure: ESOPHAGOGASTRODUODENOSCOPY (EGD) WITH PROPOFOL;  Surgeon: [name], MD;  Location: [facility] ENDOSCOPY;  Service: Endoscopy;  Laterality: N/A;   ESOPHAGOGASTRODUODENOSCOPY (EGD) WITH PROPOFOL N/A 01/15/2018   Procedure: ESOPHAGOGASTRODUODENOSCOPY (EGD) WITH PROPOFOL;  Surgeon: Daneil Dolin, MD;  Location: [facility] ENDO SUITE;  Service: Endoscopy;  Laterality: N/A;  retrieval forein body   LAPAROTOMY N/A 09/12/2015   Procedure: EXPLORATORY LAPAROTOMY;  Surgeon: Florene Glen, MD;  Location: [facility] ORS;  Service: General;  Laterality: N/A;   SIGMOIDOSCOPY N/A 09/12/2015   Procedure: Lonell Face;  Surgeon: Florene Glen, MD;  Location: [facility] ORS;  Service: General;  Laterality: N/A;   WISDOM TOOTH  EXTRACTION      There were no vitals filed for this visit.   Subjective Assessment - 03/25/21 0842     Subjective Patient reports "not too much" pain this AM. She reports feeling sore in her thigh muscles. She reports she has "good days and bad days." Patient reports not feeling constant pain in R knee, tenderness to touch  per patient. She has MRI scheduled today for R knee. Patient reports compliance with her new home exercises. Pt has been instructed to stop all medications with exception of Albuterol and Synthroid due to being pregnant. She will discuss meds with OB/GYN.    Pertinent History Patient is a 30 year old female with involved psych medical history with primary complaint of chronic bilateral knee pain with gait instability and Hx of frequent falls. Patient has had multiple conservative treatments for knee pain including anti-inflammatories, injections. Hx of R ACL sprain and post-injury rehab. She states that recently injections helped for about 3 weeks. Patient reports notable "aching" behind her kneecap with prolonged sitting (pre-patellar pain). Patient reports ongoing pain and dysfunction since 2020. Patient has frequent falls. Patient reports about 10 falls over the last 6 months. She reports direct impact onto her knees often with her falls. Patient had isolated incident of numbness due to notable swelling after fall, but has not felt numbness or paresthesias recently. When questioned about neuro history, patient reports "changes in white matter" and associated migraines (present since 57-68 years old, have remained stable since then). Pt lives with her fiance. Patient has 3 steps to get into her home and pt uses railing. Patient reports she has to negotiate grass on level ground, but she can negotiate this well. Patient reports falls can happen with prolonged standing, walking, and functional reaching while standing. She states her R knee intermittently "gives out." Pt has continuous aching in her knees.    Limitations Standing;Sitting;House hold activities;Walking    Diagnostic tests MRI and knee radiograph - see below    Patient Stated Goals Improved ability to walk, less falls, improved knee pain                 TREATMENT   Neuromuscular Re-education - for postural stability/postural control,  gluteal musculature activation and exercises to promote LE kinetic chain stability   Sidelying Clamshell; 2x10, on each side Standing heel raise/toe raise; 20x alternating PF/DF Minisquat 0-30 deg; 2x10 Standing knee flexion, alternating; 1x15, standing in walker      Therapeutic Exercise - for improved soft tissue flexibility and extensibility as needed for ROM, improved strength as needed to improve performance of CKC activities/functional movements   Ambulate lap around gym x1 with FWW; CGA  NuStep x 5 minutes, for knee AAROM and warm-up for improved soft tissue mobility/extensibility and muscle performance - subjective information gathered during this time   Quad set; 2x10, 5 sec, bilateral Seated knee extension; 2x10, 5 sec, bilateral Straight leg raise; x10 each LE, supine      Cold pack (unbilled) - for anti-inflammatory and analgesic effect as needed for reduced pain and improved ability to participate in active PT intervention, along bilateral in supine, x 5 minutes      ASSESSMENT Patient demonstrates fair quadriceps contraction and is able to perform straight leg raise without quadriceps lag or reproduction of pain. She does have notable fatigue and deconditioning; because of this, limited volume of straight leg raises was performed. She is able to progress with lower limb isotonics and low impact today and tolerates session  well. Patient has remaining deficits in bilateral knee ROM, decreased R ankle dorsiflexion mobility (non-specific R knee pain response), decreased hip and quad/hamstring strength, significant gait deviations, and anterior knee hyperaglesia. Patient will benefit from continued skilled therapeutic intervention to address the above deficits as needed for improved function and QoL.     PT Short Term Goals - 03/18/21 1049       PT SHORT TERM GOAL #1   Title Pt will be independent with HEP in order to decrease knee pain and increase strength in order to  improve pain-free function at home and work.    Baseline 03/18/21: HEP to be initiated next visit    Time 3    Period Weeks    Status New    Target Date 04/08/21      PT SHORT TERM GOAL #2   Title Patient will perform sit to stand from standard chair without significant UE support and no increase in baseline pain indicative of improved functional LE strength as needed to prevent fall during episode of LOB and improved ability to transfer    Baseline 03/18/21: Significant difficulty and heavy UE suppot with weight shift to LLE for transferring    Time 6    Period Weeks    Status New    Target Date 04/29/21               PT Long Term Goals - 03/25/21 0830       PT LONG TERM GOAL #1   Title Patient will demonstrate improved function as evidenced by a score of 57 on FOTO measure for full participation in activities at home and in the community.    Baseline 03/18/21: 47    Time 10    Period Weeks    Status New    Target Date 05/27/21      PT LONG TERM GOAL #2   Title Pt will increase strength of tested lower extremity musculature to at least 4+ MMT grade in order to demonstrate improvement in strength and function required for prolonged weightbearing activity, stair/step and obstacle negotiation    Baseline 03/18/21: Hip/knee musculature 3+ to 4- in RLE, grossly 4/5 in LLE.    Time 10    Period Weeks    Status New    Target Date 05/27/21      PT LONG TERM GOAL #3   Title Pt will decrease worst pain as reported on NPRS by at least 3 points in order to demonstrate clinically significant reduction in pain.    Baseline 03/18/21: Pain up to 10/10 at worst    Time 10    Period Weeks    Status New    Target Date 05/27/21      PT LONG TERM GOAL #4   Title Patient will ambulate up to 150 feet in hallway with least-restrictive assistive device with no LOB or lower extremity buckling and significant increase in baseline pain by 2 points or greater on NPRS - indicative of improved  household-level and short-distance mobility    Baseline 03/18/21: Significant limitation with prolonged standing activity, gait deficits and notable pain with ambulation c front-wheeled walker    Time 10    Period Weeks    Status New    Target Date 05/27/21      PT LONG TERM GOAL #5   Title Patient will improve BERG by 3 points or greater indicative of decreased risk of falling and improved function for performing functional mobility ADLs and transferring  Baseline 03/18/21: BERG to be performed on visit # 2.  03/23/21: BERG 41/56    Time 10    Period Weeks    Status New    Target Date 05/27/21                   Plan - 03/25/21 1625     Clinical Impression Statement Patient demonstrates fair quadriceps contraction and is able to perform straight leg raise without quadriceps lag or reproduction of pain. She does have notable fatigue and deconditioning; because of this, limited volume of straight leg raises was performed. She is able to progress with lower limb isotonics and low impact today and tolerates session well. Patient has remaining deficits in bilateral knee ROM, decreased R ankle dorsiflexion mobility (non-specific R knee pain response), decreased hip and quad/hamstring strength, significant gait deviations, and anterior knee hyperaglesia. Patient will benefit from continued skilled therapeutic intervention to address the above deficits as needed for improved function and QoL.    Personal Factors and Comorbidities Comorbidity 3+;Time since onset of injury/illness/exacerbation;Past/Current Experience;Behavior Pattern    Comorbidities Anxiety, depression, Hx of schizoaffective disorder, thyroid disease, obesity, chronic bilateral knee pain, GERD    Examination-Activity Limitations Bed Mobility;Dressing;Stand;Stairs;Bend;Sit;Transfers;Lift;Locomotion Level;Squat    Examination-Participation Restrictions Cleaning    Stability/Clinical Decision Making Unstable/Unpredictable     Rehab Potential Fair    PT Frequency 2x / week    PT Duration Other (comment)   10 weeks   PT Treatment/Interventions Cryotherapy;Electrical Stimulation;ADLs/Self Care Home Management;Gait training;Stair training;Functional mobility training;Therapeutic activities;Therapeutic exercise;Balance training;Neuromuscular re-education;Patient/family education;Manual techniques;Passive range of motion    PT Next Visit Plan Continued with pre-gait training, low-impact lower extremity isotonics with progression to closed-chain strengthening. Postural stability training and fall risk management. Update HEP prn.    PT Home Exercise Plan HEP to be initiated next visit    Consulted and Agree with Plan of Care Patient             Patient will benefit from skilled therapeutic intervention in order to improve the following deficits and impairments:  Abnormal gait, Decreased activity tolerance, Decreased range of motion, Decreased strength, Hypomobility, Pain, Difficulty walking, Decreased balance  Visit Diagnosis: Chronic pain of both knees  Difficulty in walking, not elsewhere classified  Muscle weakness (generalized)  Personal history of fall     Problem List Patient Active Problem List   Diagnosis Date Noted   Migraine without aura and without status migrainosus, not intractable 03/05/2021   Mild intermittent asthma without complication 94/17/4081   Gastroesophageal reflux disease 12/01/2020   Hypoglycemia 12/01/2020   History of hypoglycemia 04/26/2020   History of hyperprolactinemia 04/26/2020   Rheumatoid factor positive 12/09/2019   SS-A antibody positive 10/28/2019   Chronic pain of both knees 10/28/2019   Subclinical hypothyroidism 08/02/2019   Hyperprolactinemia ([place]) 08/02/2019   Schizoaffective disorder, depressive type ([place]) 02/02/2018   Suicide attempt ([place]) 01/16/2018   Dysfunctional uterine bleeding 01/06/2018   Borderline personality disorder ([place]) 01/04/2018   PTSD  (post-traumatic stress disorder) 01/03/2018   ASCUS of cervix with negative high risk HPV 08/28/2017   Malingering 05/06/2016   Breast tumor 03/05/2016   Schizoaffective disorder, bipolar type ([place]) 11/06/2014   Hypertension 09/29/2014   Valentina Gu, PT, DPT #[id number]  Eilleen Kempf, PT 03/25/2021, 4:26 PM  [facility] [address] [place], [place], [address] Phone: [phone]   Fax:  [phone]  [name] MRN: [id number] Date of Birth: 1991/01/14

## 2021-03-30 ENCOUNTER — Encounter: Payer: Medicare Other | Admitting: Physical Therapy

## 2021-04-01 ENCOUNTER — Ambulatory Visit: Payer: Medicare Other | Admitting: Physical Therapy

## 2021-04-01 ENCOUNTER — Other Ambulatory Visit: Payer: Self-pay

## 2021-04-01 ENCOUNTER — Encounter: Payer: Self-pay | Admitting: Physical Therapy

## 2021-04-01 DIAGNOSIS — Z9181 History of falling: Secondary | ICD-10-CM

## 2021-04-01 DIAGNOSIS — M6281 Muscle weakness (generalized): Secondary | ICD-10-CM

## 2021-04-01 DIAGNOSIS — R262 Difficulty in walking, not elsewhere classified: Secondary | ICD-10-CM

## 2021-04-01 DIAGNOSIS — M25561 Pain in right knee: Secondary | ICD-10-CM | POA: Diagnosis not present

## 2021-04-01 DIAGNOSIS — M25562 Pain in left knee: Secondary | ICD-10-CM

## 2021-04-01 NOTE — Therapy (Signed)
Melanie Bell [address] [place], [place], [address] Phone: [phone]   Fax:  [phone]  Physical Therapy Treatment  Patient Details  [name] MRN: [id number] Date of Birth: 12/01/1990 Referring Provider (PT): Rosalia Hammers, MD   Encounter Date: 04/01/2021   PT End of Session - 04/03/21 1016     Visit Number 4    Number of Visits 21    Date for PT Re-Evaluation 05/27/21    Authorization Type Medicare/Medicaid, VL based on medical necessity, no auth    Progress Note Due on Visit 10    PT Start Time 0902    PT Stop Time 0944    PT Time Calculation (min) 42 min    Equipment Utilized During Treatment Gait belt;Other (comment)   front-wheel walker (patient has her own walker)   Activity Tolerance Patient limited by pain    Behavior During Therapy Anxious             Past Medical History:  Diagnosis Date   Allergy    Anxiety    Arthritis    Asthma    Depression    Foreign body aspiration    GERD (gastroesophageal reflux disease)    Hallucinations 09/30/2014   Sizoaffective   Hyperlipidemia    Hypertension    Intentional ingestion of batteries 04/21/2016    2 AAA batteries and 1 thumb tack; intent to hurt herself/notes 70/10/2374   Self-inflicted injury 28/31/5176   Suicidal ideation 01/02/2018   Tardive dyskinesia 10/2014   recent onset   Thyroid disease     Past Surgical History:  Procedure Laterality Date   ABDOMINAL SURGERY     "years ago" to remove foreign objects   APPENDECTOMY     BREAST EXCISIONAL BIOPSY Right 09/03/2007   surgical bx removed fibroadeoma   BREAST LUMPECTOMY Right    COLONOSCOPY WITH PROPOFOL N/A 09/10/2015   Procedure: COLONOSCOPY WITH PROPOFOL;  Surgeon: [name], MD;  Location: [facility] ENDOSCOPY;  Service: Endoscopy;  Laterality: N/A;   ESOPHAGOGASTRODUODENOSCOPY N/A 11/28/2014   Procedure: ESOPHAGOGASTRODUODENOSCOPY (EGD);  Surgeon: [name],  MD;  Location: [facility] ENDOSCOPY;  Service: Endoscopy;  Laterality: N/A;   ESOPHAGOGASTRODUODENOSCOPY N/A 02/21/2016   Procedure: ESOPHAGOGASTRODUODENOSCOPY (EGD);  Surgeon: Mauri Pole, MD;  Location: [facility] ENDOSCOPY;  Service: Endoscopy;  Laterality: N/A;   ESOPHAGOGASTRODUODENOSCOPY N/A 04/21/2016   Procedure: ESOPHAGOGASTRODUODENOSCOPY (EGD);  Surgeon: Gatha Mayer, MD;  Location: [facility] ENDOSCOPY;  Service: Endoscopy;  Laterality: N/A;   ESOPHAGOGASTRODUODENOSCOPY N/A 05/06/2016   Procedure: ESOPHAGOGASTRODUODENOSCOPY (EGD);  Surgeon: Jonathon Bellows, MD;  Location: [facility] ENDOSCOPY;  Service: Gastroenterology;  Laterality: N/A;   ESOPHAGOGASTRODUODENOSCOPY N/A 06/13/2016   Procedure: ESOPHAGOGASTRODUODENOSCOPY (EGD);  Surgeon: [name], MD;  Location: [facility] ENDOSCOPY;  Service: Endoscopy;  Laterality: N/A;   ESOPHAGOGASTRODUODENOSCOPY (EGD) WITH PROPOFOL N/A 02/29/2016   Procedure: ESOPHAGOGASTRODUODENOSCOPY (EGD) WITH PROPOFOL;  Surgeon: [name], MD;  Location: [facility] ENDOSCOPY;  Service: Endoscopy;  Laterality: N/A;   ESOPHAGOGASTRODUODENOSCOPY (EGD) WITH PROPOFOL N/A 01/15/2018   Procedure: ESOPHAGOGASTRODUODENOSCOPY (EGD) WITH PROPOFOL;  Surgeon: Daneil Dolin, MD;  Location: [facility] ENDO SUITE;  Service: Endoscopy;  Laterality: N/A;  retrieval forein body   LAPAROTOMY N/A 09/12/2015   Procedure: EXPLORATORY LAPAROTOMY;  Surgeon: Florene Glen, MD;  Location: [facility] ORS;  Service: General;  Laterality: N/A;   SIGMOIDOSCOPY N/A 09/12/2015   Procedure: Lonell Face;  Surgeon: Florene Glen, MD;  Location: [facility] ORS;  Service: General;  Laterality: N/A;   WISDOM TOOTH  EXTRACTION      There were no vitals filed for this visit.   Subjective Assessment - 04/03/21 1016     Subjective Patient reports mild aching at arrival to PT. She reports 4/10 pain at arrival to PT this AM. She reports that cold air does exacerbate her symptoms. She feels that her pain is better since starting PT. Patient  reports she had miscarriage and has resumed her previous medications since she is not longer present.    Pertinent History Patient is a 30 year old female with involved psych medical history with primary complaint of chronic bilateral knee pain with gait instability and Hx of frequent falls. Patient has had multiple conservative treatments for knee pain including anti-inflammatories, injections. Hx of R ACL sprain and post-injury rehab. She states that recently injections helped for about 3 weeks. Patient reports notable "aching" behind her kneecap with prolonged sitting (pre-patellar pain). Patient reports ongoing pain and dysfunction since 2020. Patient has frequent falls. Patient reports about 10 falls over the last 6 months. She reports direct impact onto her knees often with her falls. Patient had isolated incident of numbness due to notable swelling after fall, but has not felt numbness or paresthesias recently. When questioned about neuro history, patient reports "changes in white matter" and associated migraines (present since 77-8 years old, have remained stable since then). Pt lives with her fiance. Patient has 3 steps to get into her home and pt uses railing. Patient reports she has to negotiate grass on level ground, but she can negotiate this well. Patient reports falls can happen with prolonged standing, walking, and functional reaching while standing. She states her R knee intermittently "gives out." Pt has continuous aching in her knees.    Limitations Standing;Sitting;House hold activities;Walking    Diagnostic tests MRI and knee radiograph - see below    Patient Stated Goals Improved ability to walk, less falls, improved knee pain                TREATMENT   Neuromuscular Re-education - for postural stability/postural control, gluteal musculature activation and exercises to promote LE kinetic chain stability   Sidelying Clamshell; 2x10, on each side Minisquat 0-30 deg;  2x10  *next visit* Standing knee flexion, alternating; 1x15, standing in walker  Standing heel raise/toe raise; 20x alternating PF/DF     Therapeutic Exercise - for improved soft tissue flexibility and extensibility as needed for ROM, improved strength as needed to improve performance of CKC activities/functional movements    NuStep x 5 minutes, for knee AAROM and warm-up for improved soft tissue mobility/extensibility and muscle performance - subjective information gathered during this time   Seated knee extension; 2x10, 5 sec, bilateral, 3-lb ankle weights  Straight leg raise; 2x10 each LE, supine  *not today* Ambulate lap around gym x1 with FWW; CGA Quad set; 2x10, 5 sec, bilateral     *not today* Cold pack (unbilled) - for anti-inflammatory and analgesic effect as needed for reduced pain and improved ability to participate in active PT intervention, along bilateral in supine, x 5 minutes       ASSESSMENT Patient feels that her condition is subjectively improving. Pt is still using FWW as precaution given history of R knee buckling and multiple falls. She is able to progress with volume of OKC exercise and tolerates low volume CKC drills in limited ROM well. Patient tolerates session well today without complaint of increased pain with activities performed and modest progression of program. Pt has unfortunately had miscarriage since  last visit and has resumed her regular medication routine. Patient has remaining deficits in bilateral knee ROM, decreased R ankle dorsiflexion mobility (non-specific R knee pain response), decreased hip and quad/hamstring strength, significant gait deviations, and anterior knee hyperaglesia. Patient will benefit from continued skilled therapeutic intervention to address the above deficits as needed for improved function and QoL.          PT Short Term Goals - 03/18/21 1049       PT SHORT TERM GOAL #1   Title Pt will be independent with HEP in  order to decrease knee pain and increase strength in order to improve pain-free function at home and work.    Baseline 03/18/21: HEP to be initiated next visit    Time 3    Period Weeks    Status New    Target Date 04/08/21      PT SHORT TERM GOAL #2   Title Patient will perform sit to stand from standard chair without significant UE support and no increase in baseline pain indicative of improved functional LE strength as needed to prevent fall during episode of LOB and improved ability to transfer    Baseline 03/18/21: Significant difficulty and heavy UE suppot with weight shift to LLE for transferring    Time 6    Period Weeks    Status New    Target Date 04/29/21               PT Long Term Goals - 03/25/21 0830       PT LONG TERM GOAL #1   Title Patient will demonstrate improved function as evidenced by a score of 57 on FOTO measure for full participation in activities at home and in the community.    Baseline 03/18/21: 47    Time 10    Period Weeks    Status New    Target Date 05/27/21      PT LONG TERM GOAL #2   Title Pt will increase strength of tested lower extremity musculature to at least 4+ MMT grade in order to demonstrate improvement in strength and function required for prolonged weightbearing activity, stair/step and obstacle negotiation    Baseline 03/18/21: Hip/knee musculature 3+ to 4- in RLE, grossly 4/5 in LLE.    Time 10    Period Weeks    Status New    Target Date 05/27/21      PT LONG TERM GOAL #3   Title Pt will decrease worst pain as reported on NPRS by at least 3 points in order to demonstrate clinically significant reduction in pain.    Baseline 03/18/21: Pain up to 10/10 at worst    Time 10    Period Weeks    Status New    Target Date 05/27/21      PT LONG TERM GOAL #4   Title Patient will ambulate up to 150 feet in hallway with least-restrictive assistive device with no LOB or lower extremity buckling and significant increase in baseline pain  by 2 points or greater on NPRS - indicative of improved household-level and short-distance mobility    Baseline 03/18/21: Significant limitation with prolonged standing activity, gait deficits and notable pain with ambulation c front-wheeled walker    Time 10    Period Weeks    Status New    Target Date 05/27/21      PT LONG TERM GOAL #5   Title Patient will improve BERG by 3 points or greater indicative of decreased risk  of falling and improved function for performing functional mobility ADLs and transferring    Baseline 03/18/21: BERG to be performed on visit # 2.  03/23/21: BERG 41/56    Time 10    Period Weeks    Status New    Target Date 05/27/21                   Plan - 04/03/21 1032     Clinical Impression Statement Patient feels that her condition is subjectively improving. Pt is still using FWW as precaution given history of R knee buckling and multiple falls. She is able to progress with volume of OKC exercise and tolerates low volume CKC drills in limited ROM well. Patient tolerates session well today without complaint of increased pain with activities performed and modest progression of program. Pt has unfortunately had miscarriage since last visit and has resumed her regular medication routine. Patient has remaining deficits in bilateral knee ROM, decreased R ankle dorsiflexion mobility (non-specific R knee pain response), decreased hip and quad/hamstring strength, significant gait deviations, and anterior knee hyperaglesia. Patient will benefit from continued skilled therapeutic intervention to address the above deficits as needed for improved function and QoL.    Personal Factors and Comorbidities Comorbidity 3+;Time since onset of injury/illness/exacerbation;Past/Current Experience;Behavior Pattern    Comorbidities Anxiety, depression, Hx of schizoaffective disorder, thyroid disease, obesity, chronic bilateral knee pain, GERD    Examination-Activity Limitations Bed  Mobility;Dressing;Stand;Stairs;Bend;Sit;Transfers;Lift;Locomotion Level;Squat    Examination-Participation Restrictions Cleaning    Stability/Clinical Decision Making Unstable/Unpredictable    Rehab Potential Fair    PT Frequency 2x / week    PT Duration Other (comment)   10 weeks   PT Treatment/Interventions Cryotherapy;Electrical Stimulation;ADLs/Self Care Home Management;Gait training;Stair training;Functional mobility training;Therapeutic activities;Therapeutic exercise;Balance training;Neuromuscular re-education;Patient/family education;Manual techniques;Passive range of motion    PT Next Visit Plan Continued with pre-gait training, low-impact lower extremity isotonics with progression to closed-chain strengthening. Postural stability training and fall risk management. Update HEP prn.    PT Home Exercise Plan HEP to be initiated next visit    Consulted and Agree with Plan of Care Patient             Patient will benefit from skilled therapeutic intervention in order to improve the following deficits and impairments:  Abnormal gait, Decreased activity tolerance, Decreased range of motion, Decreased strength, Hypomobility, Pain, Difficulty walking, Decreased balance  Visit Diagnosis: Chronic pain of both knees  Difficulty in walking, not elsewhere classified  Muscle weakness (generalized)  Personal history of fall     Problem List Patient Active Problem List   Diagnosis Date Noted   Migraine without aura and without status migrainosus, not intractable 03/05/2021   Mild intermittent asthma without complication 09/32/6712   Gastroesophageal reflux disease 12/01/2020   Hypoglycemia 12/01/2020   History of hypoglycemia 04/26/2020   History of hyperprolactinemia 04/26/2020   Rheumatoid factor positive 12/09/2019   SS-A antibody positive 10/28/2019   Chronic pain of both knees 10/28/2019   Subclinical hypothyroidism 08/02/2019   Hyperprolactinemia ([place]) 08/02/2019    Schizoaffective disorder, depressive type ([place]) 02/02/2018   Suicide attempt ([place]) 01/16/2018   Dysfunctional uterine bleeding 01/06/2018   Borderline personality disorder ([place]) 01/04/2018   PTSD (post-traumatic stress disorder) 01/03/2018   ASCUS of cervix with negative high risk HPV 08/28/2017   Malingering 05/06/2016   Breast tumor 03/05/2016   Schizoaffective disorder, bipolar type ([place]) 11/06/2014   Hypertension 09/29/2014   Valentina Gu, PT, DPT #[id number]  Eilleen Kempf, PT 04/03/2021, 10:33  AM  [place] [facility] [address] [place], [place], [address] Phone: [phone]   Fax:  [phone]  Name: Siya Flurry MRN: [id number] Date of Birth: [birth year]

## 2021-04-06 ENCOUNTER — Encounter: Payer: Self-pay | Admitting: Physical Therapy

## 2021-04-06 ENCOUNTER — Ambulatory Visit: Payer: Medicare Other | Admitting: Physical Therapy

## 2021-04-06 ENCOUNTER — Other Ambulatory Visit: Payer: Self-pay

## 2021-04-06 DIAGNOSIS — M25561 Pain in right knee: Secondary | ICD-10-CM | POA: Diagnosis not present

## 2021-04-06 DIAGNOSIS — M25562 Pain in left knee: Secondary | ICD-10-CM

## 2021-04-06 DIAGNOSIS — G8929 Other chronic pain: Secondary | ICD-10-CM

## 2021-04-06 DIAGNOSIS — R262 Difficulty in walking, not elsewhere classified: Secondary | ICD-10-CM

## 2021-04-06 DIAGNOSIS — Z9181 History of falling: Secondary | ICD-10-CM

## 2021-04-06 DIAGNOSIS — M6281 Muscle weakness (generalized): Secondary | ICD-10-CM

## 2021-04-06 NOTE — Therapy (Signed)
[facility] [address] [place], [place], [address] Phone: [phone]   Fax:  [phone]  Physical Therapy Treatment  Patient Details  [name] MRN: [id number] Date of Birth: Nov 24, 1990 Referring Provider (PT): Rosalia Hammers, MD   Encounter Date: 04/06/2021   PT End of Session - 04/06/21 0920     Visit Number 5    Number of Visits 21    Date for PT Re-Evaluation 05/27/21    Authorization Type Medicare/Medicaid, VL based on medical necessity, no auth    Progress Note Due on Visit 10    PT Start Time 0904    PT Stop Time 0946    PT Time Calculation (min) 42 min    Equipment Utilized During Treatment Gait belt;Other (comment)   front-wheel walker (patient has her own walker)   Activity Tolerance Patient limited by pain    Behavior During Therapy Anxious             Past Medical History:  Diagnosis Date   Allergy    Anxiety    Arthritis    Asthma    Depression    Foreign body aspiration    GERD (gastroesophageal reflux disease)    Hallucinations 09/30/2014   Sizoaffective   Hyperlipidemia    Hypertension    Intentional ingestion of batteries 04/21/2016    2 AAA batteries and 1 thumb tack; intent to hurt herself/notes 76/11/3417   Self-inflicted injury 37/90/2409   Suicidal ideation 01/02/2018   Tardive dyskinesia 10/2014   recent onset   Thyroid disease     Past Surgical History:  Procedure Laterality Date   ABDOMINAL SURGERY     "years ago" to remove foreign objects   APPENDECTOMY     BREAST EXCISIONAL BIOPSY Right 09/03/2007   surgical bx removed fibroadeoma   BREAST LUMPECTOMY Right    COLONOSCOPY WITH PROPOFOL N/A 09/10/2015   Procedure: COLONOSCOPY WITH PROPOFOL;  Surgeon: [name], MD;  Location: [facility] ENDOSCOPY;  Service: Endoscopy;  Laterality: N/A;   ESOPHAGOGASTRODUODENOSCOPY N/A 11/28/2014   Procedure: ESOPHAGOGASTRODUODENOSCOPY (EGD);  Surgeon: [name],  MD;  Location: [facility] ENDOSCOPY;  Service: Endoscopy;  Laterality: N/A;   ESOPHAGOGASTRODUODENOSCOPY N/A 02/21/2016   Procedure: ESOPHAGOGASTRODUODENOSCOPY (EGD);  Surgeon: Mauri Pole, MD;  Location: [facility] ENDOSCOPY;  Service: Endoscopy;  Laterality: N/A;   ESOPHAGOGASTRODUODENOSCOPY N/A 04/21/2016   Procedure: ESOPHAGOGASTRODUODENOSCOPY (EGD);  Surgeon: Gatha Mayer, MD;  Location: [facility] ENDOSCOPY;  Service: Endoscopy;  Laterality: N/A;   ESOPHAGOGASTRODUODENOSCOPY N/A 05/06/2016   Procedure: ESOPHAGOGASTRODUODENOSCOPY (EGD);  Surgeon: Jonathon Bellows, MD;  Location: [facility] ENDOSCOPY;  Service: Gastroenterology;  Laterality: N/A;   ESOPHAGOGASTRODUODENOSCOPY N/A 06/13/2016   Procedure: ESOPHAGOGASTRODUODENOSCOPY (EGD);  Surgeon: [name], MD;  Location: [facility] ENDOSCOPY;  Service: Endoscopy;  Laterality: N/A;   ESOPHAGOGASTRODUODENOSCOPY (EGD) WITH PROPOFOL N/A 02/29/2016   Procedure: ESOPHAGOGASTRODUODENOSCOPY (EGD) WITH PROPOFOL;  Surgeon: [name], MD;  Location: [facility] ENDOSCOPY;  Service: Endoscopy;  Laterality: N/A;   ESOPHAGOGASTRODUODENOSCOPY (EGD) WITH PROPOFOL N/A 01/15/2018   Procedure: ESOPHAGOGASTRODUODENOSCOPY (EGD) WITH PROPOFOL;  Surgeon: Daneil Dolin, MD;  Location: [facility] ENDO SUITE;  Service: Endoscopy;  Laterality: N/A;  retrieval forein body   LAPAROTOMY N/A 09/12/2015   Procedure: EXPLORATORY LAPAROTOMY;  Surgeon: Florene Glen, MD;  Location: [facility] ORS;  Service: General;  Laterality: N/A;   SIGMOIDOSCOPY N/A 09/12/2015   Procedure: Lonell Face;  Surgeon: Florene Glen, MD;  Location: [facility] ORS;  Service: General;  Laterality: N/A;   WISDOM TOOTH  EXTRACTION      There were no vitals filed for this visit.   Subjective Assessment - 04/06/21 0912     Subjective Patient reports feeling minor soreness after last visit. Patient reports about 1-2 days of soreness. Pt reports no recent "excruciating" symptoms. Patient reports more aching in her knees from cold air. Patient  reports 3/10 pain at arrival. She feels her pain experience is improving since starting PT. Pt has been more cautious in house; no recent falls or near-falls.    Pertinent History Patient is a 30 year old female with involved psych medical history with primary complaint of chronic bilateral knee pain with gait instability and Hx of frequent falls. Patient has had multiple conservative treatments for knee pain including anti-inflammatories, injections. Hx of R ACL sprain and post-injury rehab. She states that recently injections helped for about 3 weeks. Patient reports notable "aching" behind her kneecap with prolonged sitting (pre-patellar pain). Patient reports ongoing pain and dysfunction since 2020. Patient has frequent falls. Patient reports about 10 falls over the last 6 months. She reports direct impact onto her knees often with her falls. Patient had isolated incident of numbness due to notable swelling after fall, but has not felt numbness or paresthesias recently. When questioned about neuro history, patient reports "changes in white matter" and associated migraines (present since [age] years old, have remained stable since then). Pt lives with her fiance. Patient has 3 steps to get into her home and pt uses railing. Patient reports she has to negotiate grass on level ground, but she can negotiate this well. Patient reports falls can happen with prolonged standing, walking, and functional reaching while standing. She states her R knee intermittently "gives out." Pt has continuous aching in her knees.    Limitations Standing;Sitting;House hold activities;Walking    Diagnostic tests MRI and knee radiograph - see below    Patient Stated Goals Improved ability to walk, less falls, improved knee pain                TREATMENT   Neuromuscular Re-education - for postural stability/postural control, gluteal musculature activation and exercises to promote LE kinetic chain stability   Sidelying  Clamshell; 1x10, on each side (for HEP review) Minisquat 0-30 deg; 2x10 Standing knee flexion, alternating; 1x15, standing in walker  Toe tapping to 6-inch step; 2x10, alternating Standing heel raise/toe raise; 20x alternating PF/DF     Therapeutic Exercise - for improved soft tissue flexibility and extensibility as needed for ROM, improved strength as needed to improve performance of CKC activities/functional movements     NuStep x 5 minutes, for knee AAROM and warm-up for improved soft tissue mobility/extensibility and muscle performance - subjective information gathered during this time   Seated knee extension; 2x10, 5 sec, bilateral, 3-lb ankle weights   Straight leg raise; 2x10 each LE, supine, with 2-lb ankle weights [tactile cueing for maintaining quadriceps set]  Pt edu: HEP update and review     *not today* Ambulate lap around gym x1 with FWW; CGA Quad set; 2x10, 5 sec, bilateral     *not today* Cold pack (unbilled) - for anti-inflammatory and analgesic effect as needed for reduced pain and improved ability to participate in active PT intervention, along bilateral in supine, x 5 minutes       ASSESSMENT Patient demonstrates knee flexion stiffness; she is tolerating full knee extension during performance of straight leg raise well. She is still limited with terminal knee extension during performance of R seated knee extension. Pt  has been able to increase volume of standing exercise in clinic and demonstrates sound weight shift to each LE today during toe tapping without buckling or significant LOB. Pt is still using front-wheeled walker for primary means of mobility, and will need further work on restoration of strength and weight acceptance onto R lower limb prior to weaning from AD use. Patient has remaining deficits in bilateral knee ROM, decreased R ankle dorsiflexion mobility (non-specific R knee pain response), decreased hip and quad/hamstring strength, significant gait  deviations, and anterior knee hyperaglesia. Patient will benefit from continued skilled therapeutic intervention to address the above deficits as needed for improved function and QoL.        PT Short Term Goals - 03/18/21 1049       PT SHORT TERM GOAL #1   Title Pt will be independent with HEP in order to decrease knee pain and increase strength in order to improve pain-free function at home and work.    Baseline 03/18/21: HEP to be initiated next visit    Time 3    Period Weeks    Status New    Target Date 04/08/21      PT SHORT TERM GOAL #2   Title Patient will perform sit to stand from standard chair without significant UE support and no increase in baseline pain indicative of improved functional LE strength as needed to prevent fall during episode of LOB and improved ability to transfer    Baseline 03/18/21: Significant difficulty and heavy UE suppot with weight shift to LLE for transferring    Time 6    Period Weeks    Status New    Target Date 04/29/21               PT Long Term Goals - 03/25/21 0830       PT LONG TERM GOAL #1   Title Patient will demonstrate improved function as evidenced by a score of 57 on FOTO measure for full participation in activities at home and in the community.    Baseline 03/18/21: 47    Time 10    Period Weeks    Status New    Target Date 05/27/21      PT LONG TERM GOAL #2   Title Pt will increase strength of tested lower extremity musculature to at least 4+ MMT grade in order to demonstrate improvement in strength and function required for prolonged weightbearing activity, stair/step and obstacle negotiation    Baseline 03/18/21: Hip/knee musculature 3+ to 4- in RLE, grossly 4/5 in LLE.    Time 10    Period Weeks    Status New    Target Date 05/27/21      PT LONG TERM GOAL #3   Title Pt will decrease worst pain as reported on NPRS by at least 3 points in order to demonstrate clinically significant reduction in pain.    Baseline  03/18/21: Pain up to 10/10 at worst    Time 10    Period Weeks    Status New    Target Date 05/27/21      PT LONG TERM GOAL #4   Title Patient will ambulate up to 150 feet in hallway with least-restrictive assistive device with no LOB or lower extremity buckling and significant increase in baseline pain by 2 points or greater on NPRS - indicative of improved household-level and short-distance mobility    Baseline 03/18/21: Significant limitation with prolonged standing activity, gait deficits and notable pain with ambulation  c front-wheeled walker    Time 10    Period Weeks    Status New    Target Date 05/27/21      PT LONG TERM GOAL #5   Title Patient will improve BERG by 3 points or greater indicative of decreased risk of falling and improved function for performing functional mobility ADLs and transferring    Baseline 03/18/21: BERG to be performed on visit # 2.  03/23/21: BERG 41/56    Time 10    Period Weeks    Status New    Target Date 05/27/21                   Plan - 04/06/21 0917     Clinical Impression Statement Patient demonstrates knee flexion stiffness; she is tolerating full knee extension during performance of straight leg raise well. She is still limited with terminal knee extension during performance of R seated knee extension. Pt has been able to increase volume of standing exercise in clinic and demonstrates sound weight shift to each LE today during toe tapping without buckling or significant LOB. Pt is still using front-wheeled walker for primary means of mobility, and will need further work on restoration of strength and weight acceptance onto R lower limb prior to weaning from AD use. Patient has remaining deficits in bilateral knee ROM, decreased R ankle dorsiflexion mobility (non-specific R knee pain response), decreased hip and quad/hamstring strength, significant gait deviations, and anterior knee hyperaglesia. Patient will benefit from continued skilled  therapeutic intervention to address the above deficits as needed for improved function and QoL.    Personal Factors and Comorbidities Comorbidity 3+;Time since onset of injury/illness/exacerbation;Past/Current Experience;Behavior Pattern    Comorbidities Anxiety, depression, Hx of schizoaffective disorder, thyroid disease, obesity, chronic bilateral knee pain, GERD    Examination-Activity Limitations Bed Mobility;Dressing;Stand;Stairs;Bend;Sit;Transfers;Lift;Locomotion Level;Squat    Examination-Participation Restrictions Cleaning    Stability/Clinical Decision Making Unstable/Unpredictable    Rehab Potential Fair    PT Frequency 2x / week    PT Duration Other (comment)   10 weeks   PT Treatment/Interventions Cryotherapy;Electrical Stimulation;ADLs/Self Care Home Management;Gait training;Stair training;Functional mobility training;Therapeutic activities;Therapeutic exercise;Balance training;Neuromuscular re-education;Patient/family education;Manual techniques;Passive range of motion    PT Next Visit Plan Continued with pre-gait training, low-impact lower extremity isotonics with progression to closed-chain strengthening. Postural stability training and fall risk management. Update HEP prn.    PT Home Exercise Plan Access Code [email or web address]    Consulted and Agree with Plan of Care Patient             Patient will benefit from skilled therapeutic intervention in order to improve the following deficits and impairments:  Abnormal gait, Decreased activity tolerance, Decreased range of motion, Decreased strength, Hypomobility, Pain, Difficulty walking, Decreased balance  Visit Diagnosis: Chronic pain of both knees  Difficulty in walking, not elsewhere classified  Muscle weakness (generalized)  Personal history of fall     Problem List Patient Active Problem List   Diagnosis Date Noted   Migraine without aura and without status migrainosus, not intractable 03/05/2021   Mild intermittent  asthma without complication 65/78/4696   Gastroesophageal reflux disease 12/01/2020   Hypoglycemia 12/01/2020   History of hypoglycemia 04/26/2020   History of hyperprolactinemia 04/26/2020   Rheumatoid factor positive 12/09/2019   SS-A antibody positive 10/28/2019   Chronic pain of both knees 10/28/2019   Subclinical hypothyroidism 08/02/2019   Hyperprolactinemia ([place]) 08/02/2019   Schizoaffective disorder, depressive type ([place]) 02/02/2018   Suicide attempt ([place]) 01/16/2018  Dysfunctional uterine bleeding 01/06/2018   Borderline personality disorder ([place]) 01/04/2018   PTSD (post-traumatic stress disorder) 01/03/2018   ASCUS of cervix with negative high risk HPV 08/28/2017   Malingering 05/06/2016   Breast tumor 03/05/2016   Schizoaffective disorder, bipolar type ([place]) 11/06/2014   Hypertension 09/29/2014   Valentina Gu, PT, DPT #[id number]  Eilleen Kempf, PT 04/06/2021, 2:22 PM  [place] [address] [place], [place], [address] Phone: [phone]   Fax:  [phone]  [name] MRN: [id number] Date of Birth: 1991-01-25

## 2021-04-07 ENCOUNTER — Encounter: Payer: Medicare Other | Admitting: Certified Nurse Midwife

## 2021-04-13 ENCOUNTER — Encounter: Payer: Self-pay | Admitting: Physical Therapy

## 2021-04-13 ENCOUNTER — Other Ambulatory Visit: Payer: Self-pay

## 2021-04-13 ENCOUNTER — Ambulatory Visit: Payer: Medicare Other | Admitting: Physical Therapy

## 2021-04-13 DIAGNOSIS — M6281 Muscle weakness (generalized): Secondary | ICD-10-CM

## 2021-04-13 DIAGNOSIS — Z9181 History of falling: Secondary | ICD-10-CM

## 2021-04-13 DIAGNOSIS — M25561 Pain in right knee: Secondary | ICD-10-CM | POA: Diagnosis not present

## 2021-04-13 DIAGNOSIS — R262 Difficulty in walking, not elsewhere classified: Secondary | ICD-10-CM

## 2021-04-13 DIAGNOSIS — M25562 Pain in left knee: Secondary | ICD-10-CM

## 2021-04-13 DIAGNOSIS — G8929 Other chronic pain: Secondary | ICD-10-CM

## 2021-04-13 NOTE — Therapy (Signed)
[facility] [address] [place], [place], [address] Phone: [phone]   Fax:  [phone]  Physical Therapy Treatment  Patient Details  [name] MRN: [id number] Date of Birth: [birth year] Referring Provider (PT): Rosalia Hammers, MD   Encounter Date: 04/13/2021   PT End of Session - 04/13/21 1034     Visit Number 6    Number of Visits 21    Date for PT Re-Evaluation 05/27/21    Authorization Type Medicare/Medicaid, VL based on medical necessity, no auth    Progress Note Due on Visit 10    PT Start Time 1030    PT Stop Time 1114    PT Time Calculation (min) 44 min    Equipment Utilized During Treatment Gait belt;Other (comment)   front-wheel walker (patient has her own walker)   Activity Tolerance Patient limited by pain    Behavior During Therapy Anxious             Past Medical History:  Diagnosis Date   Allergy    Anxiety    Arthritis    Asthma    Depression    Foreign body aspiration    GERD (gastroesophageal reflux disease)    Hallucinations 09/30/2014   Sizoaffective   Hyperlipidemia    Hypertension    Intentional ingestion of batteries 04/21/2016    2 AAA batteries and 1 thumb tack; intent to hurt herself/notes 41/01/3789   Self-inflicted injury 24/01/7352   Suicidal ideation 01/02/2018   Tardive dyskinesia 10/2014   recent onset   Thyroid disease     Past Surgical History:  Procedure Laterality Date   ABDOMINAL SURGERY     "years ago" to remove foreign objects   APPENDECTOMY     BREAST EXCISIONAL BIOPSY Right 09/03/2007   surgical bx removed fibroadeoma   BREAST LUMPECTOMY Right    COLONOSCOPY WITH PROPOFOL N/A 09/10/2015   Procedure: COLONOSCOPY WITH PROPOFOL;  Surgeon: [name], MD;  Location: [facility] ENDOSCOPY;  Service: Endoscopy;  Laterality: N/A;   ESOPHAGOGASTRODUODENOSCOPY N/A 11/28/2014   Procedure: ESOPHAGOGASTRODUODENOSCOPY (EGD);  Surgeon: [name],  MD;  Location: [facility] ENDOSCOPY;  Service: Endoscopy;  Laterality: N/A;   ESOPHAGOGASTRODUODENOSCOPY N/A 02/21/2016   Procedure: ESOPHAGOGASTRODUODENOSCOPY (EGD);  Surgeon: Mauri Pole, MD;  Location: [facility] ENDOSCOPY;  Service: Endoscopy;  Laterality: N/A;   ESOPHAGOGASTRODUODENOSCOPY N/A 04/21/2016   Procedure: ESOPHAGOGASTRODUODENOSCOPY (EGD);  Surgeon: Gatha Mayer, MD;  Location: [facility] ENDOSCOPY;  Service: Endoscopy;  Laterality: N/A;   ESOPHAGOGASTRODUODENOSCOPY N/A 05/06/2016   Procedure: ESOPHAGOGASTRODUODENOSCOPY (EGD);  Surgeon: Jonathon Bellows, MD;  Location: [facility] ENDOSCOPY;  Service: Gastroenterology;  Laterality: N/A;   ESOPHAGOGASTRODUODENOSCOPY N/A 06/13/2016   Procedure: ESOPHAGOGASTRODUODENOSCOPY (EGD);  Surgeon: [name], MD;  Location: [facility] ENDOSCOPY;  Service: Endoscopy;  Laterality: N/A;   ESOPHAGOGASTRODUODENOSCOPY (EGD) WITH PROPOFOL N/A 02/29/2016   Procedure: ESOPHAGOGASTRODUODENOSCOPY (EGD) WITH PROPOFOL;  Surgeon: [name], MD;  Location: [facility] ENDOSCOPY;  Service: Endoscopy;  Laterality: N/A;   ESOPHAGOGASTRODUODENOSCOPY (EGD) WITH PROPOFOL N/A 01/15/2018   Procedure: ESOPHAGOGASTRODUODENOSCOPY (EGD) WITH PROPOFOL;  Surgeon: Daneil Dolin, MD;  Location: [facility] ENDO SUITE;  Service: Endoscopy;  Laterality: N/A;  retrieval forein body   LAPAROTOMY N/A 09/12/2015   Procedure: EXPLORATORY LAPAROTOMY;  Surgeon: Florene Glen, MD;  Location: [facility] ORS;  Service: General;  Laterality: N/A;   SIGMOIDOSCOPY N/A 09/12/2015   Procedure: Lonell Face;  Surgeon: Florene Glen, MD;  Location: [facility] ORS;  Service: General;  Laterality: N/A;   WISDOM TOOTH  EXTRACTION      There were no vitals filed for this visit.   Subjective Assessment - 04/13/21 1033     Subjective Patient reports some mild achiness from cold weather, about 2-3/10 pain affecting knees today. Patient reports no recent falls or accidents in her home. Patient reports compliance with updated home program.     Pertinent History Patient is a 30 year old female with involved psych medical history with primary complaint of chronic bilateral knee pain with gait instability and Hx of frequent falls. Patient has had multiple conservative treatments for knee pain including anti-inflammatories, injections. Hx of R ACL sprain and post-injury rehab. She states that recently injections helped for about 3 weeks. Patient reports notable "aching" behind her kneecap with prolonged sitting (pre-patellar pain). Patient reports ongoing pain and dysfunction since 2020. Patient has frequent falls. Patient reports about 10 falls over the last 6 months. She reports direct impact onto her knees often with her falls. Patient had isolated incident of numbness due to notable swelling after fall, but has not felt numbness or paresthesias recently. When questioned about neuro history, patient reports "changes in white matter" and associated migraines (present since [age] years old, have remained stable since then). Pt lives with her fiance. Patient has 3 steps to get into her home and pt uses railing. Patient reports she has to negotiate grass on level ground, but she can negotiate this well. Patient reports falls can happen with prolonged standing, walking, and functional reaching while standing. She states her R knee intermittently "gives out." Pt has continuous aching in her knees.    Limitations Standing;Sitting;House hold activities;Walking    Diagnostic tests MRI and knee radiograph - see below    Patient Stated Goals Improved ability to walk, less falls, improved knee pain               TREATMENT   Neuromuscular Re-education - for postural stability/postural control, gluteal musculature activation and exercises to promote LE kinetic chain stability, dynamic gait stability   In // bars: Forward and retro stepping; 4x D/B, no UE support Sidestepping; 4x D/B, no UE support Standing march; 2x10 alt, no UE support Toe tapping  to 6-inch step; 1x10, alternating Toe tapping to 6-inch step with stance on Airex; 1x10, alternating Minisquat 0-30 deg; 2x10  *not today* Standing knee flexion, alternating; 1x15, standing in walker  Standing heel raise/toe raise; 20x alternating PF/DF Sidelying Clamshell; 1x10, on each side (for HEP review)    Therapeutic Exercise - for improved soft tissue flexibility and extensibility as needed for ROM, improved strength as needed to improve performance of CKC activities/functional movements     NuStep x 5 minutes, Level 0-2; for knee AAROM and warm-up for improved soft tissue mobility/extensibility and muscle performance - subjective information gathered during this time   Seated knee extension; 2x10, 5 sec, bilateral, 4-lb ankle weights   Straight leg raise; 2x10 each LE, supine, with 2-lb ankle weights [tactile cueing for maintaining quadriceps set]        *not today* Ambulate lap around gym x1 with FWW; CGA Quad set; 2x10, 5 sec, bilateral     *not today* Cold pack (unbilled) - for anti-inflammatory and analgesic effect as needed for reduced pain and improved ability to participate in active PT intervention, along bilateral in supine, x 5 minutes       ASSESSMENT Patient demonstrates stable gait in parallel bars without upper extremity support, though she does have shortened unipedal support time. She exhibits improving performance of  quadriceps isotonics with improved extension ROM exhibited with R seated knee extension. She has low to moderate level of pain at this time compared to severe pain reported at outset of PT. Pt still needs further work on gait stability and strengthening prior to wean from use of AD. Patient has remaining deficits in bilateral knee ROM, decreased R ankle dorsiflexion mobility (non-specific R knee pain response), decreased hip and quad/hamstring strength, significant gait deviations, and anterior knee hyperaglesia. Patient will benefit from  continued skilled therapeutic intervention to address the above deficits as needed for improved function and QoL.        PT Short Term Goals - 03/18/21 1049       PT SHORT TERM GOAL #1   Title Pt will be independent with HEP in order to decrease knee pain and increase strength in order to improve pain-free function at home and work.    Baseline 03/18/21: HEP to be initiated next visit    Time 3    Period Weeks    Status New    Target Date 04/08/21      PT SHORT TERM GOAL #2   Title Patient will perform sit to stand from standard chair without significant UE support and no increase in baseline pain indicative of improved functional LE strength as needed to prevent fall during episode of LOB and improved ability to transfer    Baseline 03/18/21: Significant difficulty and heavy UE suppot with weight shift to LLE for transferring    Time 6    Period Weeks    Status New    Target Date 04/29/21               PT Long Term Goals - 03/25/21 0830       PT LONG TERM GOAL #1   Title Patient will demonstrate improved function as evidenced by a score of 57 on FOTO measure for full participation in activities at home and in the community.    Baseline 03/18/21: 47    Time 10    Period Weeks    Status New    Target Date 05/27/21      PT LONG TERM GOAL #2   Title Pt will increase strength of tested lower extremity musculature to at least 4+ MMT grade in order to demonstrate improvement in strength and function required for prolonged weightbearing activity, stair/step and obstacle negotiation    Baseline 03/18/21: Hip/knee musculature 3+ to 4- in RLE, grossly 4/5 in LLE.    Time 10    Period Weeks    Status New    Target Date 05/27/21      PT LONG TERM GOAL #3   Title Pt will decrease worst pain as reported on NPRS by at least 3 points in order to demonstrate clinically significant reduction in pain.    Baseline 03/18/21: Pain up to 10/10 at worst    Time 10    Period Weeks    Status  New    Target Date 05/27/21      PT LONG TERM GOAL #4   Title Patient will ambulate up to 150 feet in hallway with least-restrictive assistive device with no LOB or lower extremity buckling and significant increase in baseline pain by 2 points or greater on NPRS - indicative of improved household-level and short-distance mobility    Baseline 03/18/21: Significant limitation with prolonged standing activity, gait deficits and notable pain with ambulation c front-wheeled walker    Time 10    Period  Weeks    Status New    Target Date 05/27/21      PT LONG TERM GOAL #5   Title Patient will improve BERG by 3 points or greater indicative of decreased risk of falling and improved function for performing functional mobility ADLs and transferring    Baseline 03/18/21: BERG to be performed on visit # 2.  03/23/21: BERG 41/56    Time 10    Period Weeks    Status New    Target Date 05/27/21                   Plan - 04/14/21 0850     Clinical Impression Statement Patient demonstrates stable gait in parallel bars without upper extremity support, though she does have shortened unipedal support time. She exhibits improving performance of quadriceps isotonics with improved extension ROM exhibited with R seated knee extension. She has low to moderate level of pain at this time compared to severe pain reported at outset of PT. Pt still needs further work on gait stability and strengthening prior to wean from use of AD. Patient has remaining deficits in bilateral knee ROM, decreased R ankle dorsiflexion mobility (non-specific R knee pain response), decreased hip and quad/hamstring strength, significant gait deviations, and anterior knee hyperaglesia. Patient will benefit from continued skilled therapeutic intervention to address the above deficits as needed for improved function and QoL.    Personal Factors and Comorbidities Comorbidity 3+;Time since onset of injury/illness/exacerbation;Past/Current  Experience;Behavior Pattern    Comorbidities Anxiety, depression, Hx of schizoaffective disorder, thyroid disease, obesity, chronic bilateral knee pain, GERD    Examination-Activity Limitations Bed Mobility;Dressing;Stand;Stairs;Bend;Sit;Transfers;Lift;Locomotion Level;Squat    Examination-Participation Restrictions Cleaning    Stability/Clinical Decision Making Unstable/Unpredictable    Rehab Potential Fair    PT Frequency 2x / week    PT Duration Other (comment)   10 weeks   PT Treatment/Interventions Cryotherapy;Electrical Stimulation;ADLs/Self Care Home Management;Gait training;Stair training;Functional mobility training;Therapeutic activities;Therapeutic exercise;Balance training;Neuromuscular re-education;Patient/family education;Manual techniques;Passive range of motion    PT Next Visit Plan Continued with pre-gait training, low-impact lower extremity isotonics with progression to closed-chain strengthening. Postural stability training and fall risk management. Update HEP prn.    PT Home Exercise Plan Access Code [email or web address]    Consulted and Agree with Plan of Care Patient             Patient will benefit from skilled therapeutic intervention in order to improve the following deficits and impairments:  Abnormal gait, Decreased activity tolerance, Decreased range of motion, Decreased strength, Hypomobility, Pain, Difficulty walking, Decreased balance  Visit Diagnosis: Chronic pain of both knees  Difficulty in walking, not elsewhere classified  Muscle weakness (generalized)  Personal history of fall     Problem List Patient Active Problem List   Diagnosis Date Noted   Migraine without aura and without status migrainosus, not intractable 03/05/2021   Mild intermittent asthma without complication 69/48/5462   Gastroesophageal reflux disease 12/01/2020   Hypoglycemia 12/01/2020   History of hypoglycemia 04/26/2020   History of hyperprolactinemia 04/26/2020   Rheumatoid factor  positive 12/09/2019   SS-A antibody positive 10/28/2019   Chronic pain of both knees 10/28/2019   Subclinical hypothyroidism 08/02/2019   Hyperprolactinemia ([place]) 08/02/2019   Schizoaffective disorder, depressive type ([place]) 02/02/2018   Suicide attempt ([place]) 01/16/2018   Dysfunctional uterine bleeding 01/06/2018   Borderline personality disorder ([place]) 01/04/2018   PTSD (post-traumatic stress disorder) 01/03/2018   ASCUS of cervix with negative high risk HPV 08/28/2017   Malingering 05/06/2016  Breast tumor 03/05/2016   Schizoaffective disorder, bipolar type ([place]) 11/06/2014   Hypertension 09/29/2014   Valentina Gu, PT, DPT #[id number]  Eilleen Kempf, PT 04/14/2021, 9:14 AM  [facility] [address] [place], [place], [address] Phone: [phone]   Fax:  [phone]  [name] MRN: [id number] Date of Birth: Jul 06, 1990

## 2021-04-15 ENCOUNTER — Other Ambulatory Visit: Payer: Self-pay

## 2021-04-15 ENCOUNTER — Ambulatory Visit: Payer: Medicare Other | Attending: Sports Medicine | Admitting: Physical Therapy

## 2021-04-15 ENCOUNTER — Encounter: Payer: Self-pay | Admitting: Physical Therapy

## 2021-04-15 DIAGNOSIS — M25562 Pain in left knee: Secondary | ICD-10-CM | POA: Insufficient documentation

## 2021-04-15 DIAGNOSIS — Z9181 History of falling: Secondary | ICD-10-CM | POA: Insufficient documentation

## 2021-04-15 DIAGNOSIS — M25561 Pain in right knee: Secondary | ICD-10-CM | POA: Diagnosis not present

## 2021-04-15 DIAGNOSIS — R262 Difficulty in walking, not elsewhere classified: Secondary | ICD-10-CM | POA: Diagnosis present

## 2021-04-15 DIAGNOSIS — G8929 Other chronic pain: Secondary | ICD-10-CM | POA: Diagnosis present

## 2021-04-15 DIAGNOSIS — M6281 Muscle weakness (generalized): Secondary | ICD-10-CM | POA: Insufficient documentation

## 2021-04-15 NOTE — Therapy (Signed)
[facility] [address] [place], [place], [address] Phone: [phone]   Fax:  [phone]  Physical Therapy Treatment  Patient Details  [name] MRN: [id number] Date of Birth: [birth year] Referring Provider (PT): Rosalia Hammers, MD   Encounter Date: 04/15/2021   PT End of Session - 04/15/21 1052     Visit Number 7    Number of Visits 21    Date for PT Re-Evaluation 05/27/21    Authorization Type Medicare/Medicaid, VL based on medical necessity, no auth    Progress Note Due on Visit 10    PT Start Time 1025    PT Stop Time 1112    PT Time Calculation (min) 47 min    Equipment Utilized During Treatment Gait belt;Other (comment)   front-wheel walker (patient has her own walker)   Activity Tolerance Patient limited by pain    Behavior During Therapy Anxious             Past Medical History:  Diagnosis Date   Allergy    Anxiety    Arthritis    Asthma    Depression    Foreign body aspiration    GERD (gastroesophageal reflux disease)    Hallucinations 09/30/2014   Sizoaffective   Hyperlipidemia    Hypertension    Intentional ingestion of batteries 04/21/2016    2 AAA batteries and 1 thumb tack; intent to hurt herself/notes 60/05/930   Self-inflicted injury 35/57/3220   Suicidal ideation 01/02/2018   Tardive dyskinesia 10/2014   recent onset   Thyroid disease     Past Surgical History:  Procedure Laterality Date   ABDOMINAL SURGERY     "years ago" to remove foreign objects   APPENDECTOMY     BREAST EXCISIONAL BIOPSY Right 09/03/2007   surgical bx removed fibroadeoma   BREAST LUMPECTOMY Right    COLONOSCOPY WITH PROPOFOL N/A 09/10/2015   Procedure: COLONOSCOPY WITH PROPOFOL;  Surgeon: [name], MD;  Location: [facility] ENDOSCOPY;  Service: Endoscopy;  Laterality: N/A;   ESOPHAGOGASTRODUODENOSCOPY N/A 11/28/2014   Procedure: ESOPHAGOGASTRODUODENOSCOPY (EGD);  Surgeon: [name],  MD;  Location: [facility] ENDOSCOPY;  Service: Endoscopy;  Laterality: N/A;   ESOPHAGOGASTRODUODENOSCOPY N/A 02/21/2016   Procedure: ESOPHAGOGASTRODUODENOSCOPY (EGD);  Surgeon: Mauri Pole, MD;  Location: [facility] ENDOSCOPY;  Service: Endoscopy;  Laterality: N/A;   ESOPHAGOGASTRODUODENOSCOPY N/A 04/21/2016   Procedure: ESOPHAGOGASTRODUODENOSCOPY (EGD);  Surgeon: Gatha Mayer, MD;  Location: [facility] ENDOSCOPY;  Service: Endoscopy;  Laterality: N/A;   ESOPHAGOGASTRODUODENOSCOPY N/A 05/06/2016   Procedure: ESOPHAGOGASTRODUODENOSCOPY (EGD);  Surgeon: Jonathon Bellows, MD;  Location: [facility] ENDOSCOPY;  Service: Gastroenterology;  Laterality: N/A;   ESOPHAGOGASTRODUODENOSCOPY N/A 06/13/2016   Procedure: ESOPHAGOGASTRODUODENOSCOPY (EGD);  Surgeon: [name], MD;  Location: [facility] ENDOSCOPY;  Service: Endoscopy;  Laterality: N/A;   ESOPHAGOGASTRODUODENOSCOPY (EGD) WITH PROPOFOL N/A 02/29/2016   Procedure: ESOPHAGOGASTRODUODENOSCOPY (EGD) WITH PROPOFOL;  Surgeon: [name], MD;  Location: [facility] ENDOSCOPY;  Service: Endoscopy;  Laterality: N/A;   ESOPHAGOGASTRODUODENOSCOPY (EGD) WITH PROPOFOL N/A 01/15/2018   Procedure: ESOPHAGOGASTRODUODENOSCOPY (EGD) WITH PROPOFOL;  Surgeon: Daneil Dolin, MD;  Location: [facility] ENDO SUITE;  Service: Endoscopy;  Laterality: N/A;  retrieval forein body   LAPAROTOMY N/A 09/12/2015   Procedure: EXPLORATORY LAPAROTOMY;  Surgeon: Florene Glen, MD;  Location: [facility] ORS;  Service: General;  Laterality: N/A;   SIGMOIDOSCOPY N/A 09/12/2015   Procedure: Lonell Face;  Surgeon: Florene Glen, MD;  Location: [facility] ORS;  Service: General;  Laterality: N/A;   WISDOM TOOTH  EXTRACTION      There were no vitals filed for this visit.   Subjective Assessment - 04/15/21 1032     Subjective Patient was prescribed ubrogepant as rescue treatment for migraines and given new diagnosis of fibromyalgia. She was prescribed Lyrica. Pt has not started new Rx yet. Patient reports mild achiness this morning  similar to her last visit - "not really hurting." Patient reports doing well with her HEP.    Pertinent History Patient is a 30 year old female with involved psych medical history with primary complaint of chronic bilateral knee pain with gait instability and Hx of frequent falls. Patient has had multiple conservative treatments for knee pain including anti-inflammatories, injections. Hx of R ACL sprain and post-injury rehab. She states that recently injections helped for about 3 weeks. Patient reports notable "aching" behind her kneecap with prolonged sitting (pre-patellar pain). Patient reports ongoing pain and dysfunction since 2020. Patient has frequent falls. Patient reports about 10 falls over the last 6 months. She reports direct impact onto her knees often with her falls. Patient had isolated incident of numbness due to notable swelling after fall, but has not felt numbness or paresthesias recently. When questioned about neuro history, patient reports "changes in white matter" and associated migraines (present since [age] years old, have remained stable since then). Pt lives with her fiance. Patient has 3 steps to get into her home and pt uses railing. Patient reports she has to negotiate grass on level ground, but she can negotiate this well. Patient reports falls can happen with prolonged standing, walking, and functional reaching while standing. She states her R knee intermittently "gives out." Pt has continuous aching in her knees.    Limitations Standing;Sitting;House hold activities;Walking    Diagnostic tests MRI and knee radiograph - see below    Patient Stated Goals Improved ability to walk, less falls, improved knee pain               TREATMENT   Neuromuscular Re-education - for postural stability/postural control, gluteal musculature activation and exercises to promote LE kinetic chain stability, dynamic gait stability     In // bars: Sidestepping; 4x D/B, no UE support with  Blue Tband around thighs superior to patellae Hurdle step over blue line; x10 bilateral LE Toe tapping to 6-inch step; 1x10, alternating Toe tapping to 12-inch step (two steps stacked); 1x10 alternating [report of R knee pain toward end of set] Minisquat 0-30 deg; 2x10    *not today* Forward and retro stepping; 4x D/B, no UE support Standing march; 2x10 alt, no UE support Toe tapping to 6-inch step with stance on Airex; 1x10, alternating Standing knee flexion, alternating; 1x15, standing in walker  Standing heel raise/toe raise; 20x alternating PF/DF Sidelying Clamshell; 1x10, on each side (for HEP review)     Therapeutic Exercise - for improved soft tissue flexibility and extensibility as needed for ROM, improved strength as needed to improve performance of CKC activities/functional movements     NuStep x 5 minutes, Level 2; for knee AAROM and warm-up for improved soft tissue mobility/extensibility and muscle performance - subjective information gathered during this time   Seated knee extension; 2x10, 5 sec, bilateral, 5-lb ankle weights   Straight leg raise; 2x10 each LE, supine, with 3-lb ankle weights [tactile cueing for maintaining quadriceps set]         *not today* Ambulate lap around gym x1 with FWW; CGA Quad set; 2x10, 5 sec, bilateral     *not today* Cold pack (unbilled) -  for anti-inflammatory and analgesic effect as needed for reduced pain and improved ability to participate in active PT intervention, along bilateral in supine, x 5 minutes       ASSESSMENT Patient is ambulating at this time light touch on FWW versus heavy trunk lean onto walker to offload lower limbs. She demonstrates safe gait with use of walker c light touch - discussed monitoring for episodes of buckling or LOB and anticipated gradual wean to less-restrictive AD. Patient does have limited tolerance of R knee flexion with notable increase in pain with performance of toe tapping to 12-inch surface.  She tolerates OKC and limited-ROM CKC exercises well at this time. Patient has remaining deficits in bilateral knee ROM, decreased R ankle dorsiflexion mobility (non-specific R knee pain response), decreased hip and quad/hamstring strength, significant gait deviations, and anterior knee hyperaglesia. Patient will benefit from continued skilled therapeutic intervention to address the above deficits as needed for improved function and QoL.       PT Short Term Goals - 03/18/21 1049       PT SHORT TERM GOAL #1   Title Pt will be independent with HEP in order to decrease knee pain and increase strength in order to improve pain-free function at home and work.    Baseline 03/18/21: HEP to be initiated next visit    Time 3    Period Weeks    Status New    Target Date 04/08/21      PT SHORT TERM GOAL #2   Title Patient will perform sit to stand from standard chair without significant UE support and no increase in baseline pain indicative of improved functional LE strength as needed to prevent fall during episode of LOB and improved ability to transfer    Baseline 03/18/21: Significant difficulty and heavy UE suppot with weight shift to LLE for transferring    Time 6    Period Weeks    Status New    Target Date 04/29/21               PT Long Term Goals - 03/25/21 0830       PT LONG TERM GOAL #1   Title Patient will demonstrate improved function as evidenced by a score of 57 on FOTO measure for full participation in activities at home and in the community.    Baseline 03/18/21: 47    Time 10    Period Weeks    Status New    Target Date 05/27/21      PT LONG TERM GOAL #2   Title Pt will increase strength of tested lower extremity musculature to at least 4+ MMT grade in order to demonstrate improvement in strength and function required for prolonged weightbearing activity, stair/step and obstacle negotiation    Baseline 03/18/21: Hip/knee musculature 3+ to 4- in RLE, grossly 4/5 in LLE.     Time 10    Period Weeks    Status New    Target Date 05/27/21      PT LONG TERM GOAL #3   Title Pt will decrease worst pain as reported on NPRS by at least 3 points in order to demonstrate clinically significant reduction in pain.    Baseline 03/18/21: Pain up to 10/10 at worst    Time 10    Period Weeks    Status New    Target Date 05/27/21      PT LONG TERM GOAL #4   Title Patient will ambulate up to 150 feet in  hallway with least-restrictive assistive device with no LOB or lower extremity buckling and significant increase in baseline pain by 2 points or greater on NPRS - indicative of improved household-level and short-distance mobility    Baseline 03/18/21: Significant limitation with prolonged standing activity, gait deficits and notable pain with ambulation c front-wheeled walker    Time 10    Period Weeks    Status New    Target Date 05/27/21      PT LONG TERM GOAL #5   Title Patient will improve BERG by 3 points or greater indicative of decreased risk of falling and improved function for performing functional mobility ADLs and transferring    Baseline 03/18/21: BERG to be performed on visit # 2.  03/23/21: BERG 41/56    Time 10    Period Weeks    Status New    Target Date 05/27/21                   Plan - 04/15/21 1137     Clinical Impression Statement Patient is ambulating at this time light touch on FWW versus heavy trunk lean onto walker to offload lower limbs. She demonstrates safe gait with use of walker c light touch - discussed monitoring for episodes of buckling or LOB and anticipated gradual wean to less-restrictive AD. Patient does have limited tolerance of R knee flexion with notable increase in pain with performance of toe tapping to 12-inch surface. She tolerates OKC and limited-ROM CKC exercises well at this time. Patient has remaining deficits in bilateral knee ROM, decreased R ankle dorsiflexion mobility (non-specific R knee pain response), decreased hip  and quad/hamstring strength, significant gait deviations, and anterior knee hyperaglesia. Patient will benefit from continued skilled therapeutic intervention to address the above deficits as needed for improved function and QoL.    Personal Factors and Comorbidities Comorbidity 3+;Time since onset of injury/illness/exacerbation;Past/Current Experience;Behavior Pattern    Comorbidities Anxiety, depression, Hx of schizoaffective disorder, thyroid disease, obesity, chronic bilateral knee pain, GERD    Examination-Activity Limitations Bed Mobility;Dressing;Stand;Stairs;Bend;Sit;Transfers;Lift;Locomotion Level;Squat    Examination-Participation Restrictions Cleaning    Stability/Clinical Decision Making Unstable/Unpredictable    Rehab Potential Fair    PT Frequency 2x / week    PT Duration Other (comment)   10 weeks   PT Treatment/Interventions Cryotherapy;Electrical Stimulation;ADLs/Self Care Home Management;Gait training;Stair training;Functional mobility training;Therapeutic activities;Therapeutic exercise;Balance training;Neuromuscular re-education;Patient/family education;Manual techniques;Passive range of motion    PT Next Visit Plan Continued with pre-gait training, low-impact lower extremity isotonics with progression to closed-chain strengthening. Postural stability training and fall risk management. Update HEP prn.    PT Home Exercise Plan Access Code [email or web address]    Consulted and Agree with Plan of Care Patient             Patient will benefit from skilled therapeutic intervention in order to improve the following deficits and impairments:  Abnormal gait, Decreased activity tolerance, Decreased range of motion, Decreased strength, Hypomobility, Pain, Difficulty walking, Decreased balance  Visit Diagnosis: Chronic pain of both knees  Difficulty in walking, not elsewhere classified  Muscle weakness (generalized)  Personal history of fall     Problem List Patient Active Problem  List   Diagnosis Date Noted   Migraine without aura and without status migrainosus, not intractable 03/05/2021   Mild intermittent asthma without complication 81/05/7508   Gastroesophageal reflux disease 12/01/2020   Hypoglycemia 12/01/2020   History of hypoglycemia 04/26/2020   History of hyperprolactinemia 04/26/2020   Rheumatoid factor positive 12/09/2019   SS-A  antibody positive 10/28/2019   Chronic pain of both knees 10/28/2019   Subclinical hypothyroidism 08/02/2019   Hyperprolactinemia ([place]) 08/02/2019   Schizoaffective disorder, depressive type ([place]) 02/02/2018   Suicide attempt ([place]) 01/16/2018   Dysfunctional uterine bleeding 01/06/2018   Borderline personality disorder ([place]) 01/04/2018   PTSD (post-traumatic stress disorder) 01/03/2018   ASCUS of cervix with negative high risk HPV 08/28/2017   Malingering 05/06/2016   Breast tumor 03/05/2016   Schizoaffective disorder, bipolar type ([place]) 11/06/2014   Hypertension 09/29/2014   Valentina Gu, PT, DPT #[id number]  Eilleen Kempf, PT 04/15/2021, 11:37 AM  [facility] [address] [name], [place], [address] Phone: [phone]   Fax:  [phone]  [name] MRN: [id number] Date of Birth: 1990/11/28

## 2021-04-20 ENCOUNTER — Other Ambulatory Visit: Payer: Self-pay

## 2021-04-20 ENCOUNTER — Ambulatory Visit: Payer: Medicare Other | Admitting: Physical Therapy

## 2021-04-20 ENCOUNTER — Encounter: Payer: Self-pay | Admitting: Physical Therapy

## 2021-04-20 DIAGNOSIS — M25561 Pain in right knee: Secondary | ICD-10-CM | POA: Diagnosis not present

## 2021-04-20 DIAGNOSIS — R262 Difficulty in walking, not elsewhere classified: Secondary | ICD-10-CM

## 2021-04-20 DIAGNOSIS — M6281 Muscle weakness (generalized): Secondary | ICD-10-CM

## 2021-04-20 DIAGNOSIS — Z9181 History of falling: Secondary | ICD-10-CM

## 2021-04-20 DIAGNOSIS — G8929 Other chronic pain: Secondary | ICD-10-CM

## 2021-04-20 DIAGNOSIS — M25562 Pain in left knee: Secondary | ICD-10-CM

## 2021-04-20 NOTE — Therapy (Signed)
[facility] [address]. [place], [place], [address] Phone: [phone]   Fax:  [phone]  Physical Therapy Treatment  Patient Details  [name] MRN: [id number] Date of Birth: Jul 12, 1990 Referring Provider (PT): Rosalia Hammers, MD   Encounter Date: 04/20/2021   PT End of Session - 04/20/21 0952     Visit Number 8    Number of Visits 21    Date for PT Re-Evaluation 05/27/21    Authorization Type Medicare/Medicaid, VL based on medical necessity, no auth    Progress Note Due on Visit 10    PT Start Time 0945    PT Stop Time 1030    PT Time Calculation (min) 45 min    Equipment Utilized During Treatment Gait belt;Other (comment)   front-wheel walker (patient has her own walker)   Activity Tolerance Patient limited by pain    Behavior During Therapy Anxious             Past Medical History:  Diagnosis Date   Allergy    Anxiety    Arthritis    Asthma    Depression    Foreign body aspiration    GERD (gastroesophageal reflux disease)    Hallucinations 09/30/2014   Sizoaffective   Hyperlipidemia    Hypertension    Intentional ingestion of batteries 04/21/2016    2 AAA batteries and 1 thumb tack; intent to hurt herself/notes 21/05/9415   Self-inflicted injury 40/81/4481   Suicidal ideation 01/02/2018   Tardive dyskinesia 10/2014   recent onset   Thyroid disease     Past Surgical History:  Procedure Laterality Date   ABDOMINAL SURGERY     "years ago" to remove foreign objects   APPENDECTOMY     BREAST EXCISIONAL BIOPSY Right 09/03/2007   surgical bx removed fibroadeoma   BREAST LUMPECTOMY Right    COLONOSCOPY WITH PROPOFOL N/A 09/10/2015   Procedure: COLONOSCOPY WITH PROPOFOL;  Surgeon: [name], MD;  Location: [facility] ENDOSCOPY;  Service: Endoscopy;  Laterality: N/A;   ESOPHAGOGASTRODUODENOSCOPY N/A 11/28/2014   Procedure: ESOPHAGOGASTRODUODENOSCOPY (EGD);  Surgeon: [name],  MD;  Location: [facility] ENDOSCOPY;  Service: Endoscopy;  Laterality: N/A;   ESOPHAGOGASTRODUODENOSCOPY N/A 02/21/2016   Procedure: ESOPHAGOGASTRODUODENOSCOPY (EGD);  Surgeon: Mauri Pole, MD;  Location: [facility] ENDOSCOPY;  Service: Endoscopy;  Laterality: N/A;   ESOPHAGOGASTRODUODENOSCOPY N/A 04/21/2016   Procedure: ESOPHAGOGASTRODUODENOSCOPY (EGD);  Surgeon: Gatha Mayer, MD;  Location: [facility] ENDOSCOPY;  Service: Endoscopy;  Laterality: N/A;   ESOPHAGOGASTRODUODENOSCOPY N/A 05/06/2016   Procedure: ESOPHAGOGASTRODUODENOSCOPY (EGD);  Surgeon: Jonathon Bellows, MD;  Location: [facility] ENDOSCOPY;  Service: Gastroenterology;  Laterality: N/A;   ESOPHAGOGASTRODUODENOSCOPY N/A 06/13/2016   Procedure: ESOPHAGOGASTRODUODENOSCOPY (EGD);  Surgeon: [name], MD;  Location: [facility] ENDOSCOPY;  Service: Endoscopy;  Laterality: N/A;   ESOPHAGOGASTRODUODENOSCOPY (EGD) WITH PROPOFOL N/A 02/29/2016   Procedure: ESOPHAGOGASTRODUODENOSCOPY (EGD) WITH PROPOFOL;  Surgeon: [name], MD;  Location: [facility] ENDOSCOPY;  Service: Endoscopy;  Laterality: N/A;   ESOPHAGOGASTRODUODENOSCOPY (EGD) WITH PROPOFOL N/A 01/15/2018   Procedure: ESOPHAGOGASTRODUODENOSCOPY (EGD) WITH PROPOFOL;  Surgeon: Daneil Dolin, MD;  Location: [facility] ENDO SUITE;  Service: Endoscopy;  Laterality: N/A;  retrieval forein body   LAPAROTOMY N/A 09/12/2015   Procedure: EXPLORATORY LAPAROTOMY;  Surgeon: Florene Glen, MD;  Location: [facility] ORS;  Service: General;  Laterality: N/A;   SIGMOIDOSCOPY N/A 09/12/2015   Procedure: Lonell Face;  Surgeon: Florene Glen, MD;  Location: [facility] ORS;  Service: General;  Laterality: N/A;   WISDOM TOOTH  EXTRACTION      There were no vitals filed for this visit.   Subjective Assessment - 04/20/21 0949     Subjective Patient reports starting Lyrica this past Friday and she can tell subtle difference with it. Patient reports one fall since starting PT; she reports getting out of her shower and she slipped this past Saturday -  she states she landed on her shower chair versus falling onto the floor. Patient reports no significant pain at arrival to PT. She reports compliance with her HEP.    Pertinent History Patient is a 30 year old female with involved psych medical history with primary complaint of chronic bilateral knee pain with gait instability and Hx of frequent falls. Patient has had multiple conservative treatments for knee pain including anti-inflammatories, injections. Hx of R ACL sprain and post-injury rehab. She states that recently injections helped for about 3 weeks. Patient reports notable "aching" behind her kneecap with prolonged sitting (pre-patellar pain). Patient reports ongoing pain and dysfunction since 2020. Patient has frequent falls. Patient reports about 10 falls over the last 6 months. She reports direct impact onto her knees often with her falls. Patient had isolated incident of numbness due to notable swelling after fall, but has not felt numbness or paresthesias recently. When questioned about neuro history, patient reports "changes in white matter" and associated migraines (present since [age] years old, have remained stable since then). Pt lives with her fiance. Patient has 3 steps to get into her home and pt uses railing. Patient reports she has to negotiate grass on level ground, but she can negotiate this well. Patient reports falls can happen with prolonged standing, walking, and functional reaching while standing. She states her R knee intermittently "gives out." Pt has continuous aching in her knees.    Limitations Standing;Sitting;House hold activities;Walking    Diagnostic tests MRI and knee radiograph - see below    Patient Stated Goals Improved ability to walk, less falls, improved knee pain                 TREATMENT   Neuromuscular Re-education - for postural stability/postural control, gluteal musculature activation and exercises to promote LE kinetic chain stability, dynamic  gait stability     In // bars: Toe tapping to 6-inch step with stance on Airex; 2x10, alternating  Gait with SPC with cueing for cane/foot placement; 2 laps around gym with intermittent demonstration to facilitate proper sequencing of SPC during gait   *next visit* Hurdle step over blue line; x10 bilateral LE Sidestepping; 4x D/B, no UE support with Blue Tband around thighs superior to patellae     *not today* Toe tapping to 12-inch step (two steps stacked); 1x10 alternating [report of R knee pain toward end of set] Minisquat 0-30 deg; 2x10 Forward and retro stepping; 4x D/B, no UE support Standing march; 2x10 alt, no UE support Standing knee flexion, alternating; 1x15, standing in walker  Standing heel raise/toe raise; 20x alternating PF/DF Sidelying Clamshell; 1x10, on each side (for HEP review)     Therapeutic Exercise - for improved soft tissue flexibility and extensibility as needed for ROM, improved strength as needed to improve performance of CKC activities/functional movements     NuStep x 5 minutes, Level 2; for knee AAROM and warm-up for improved soft tissue mobility/extensibility and muscle performance - subjective information gathered during this time   Seated knee extension; 2x10, 5 sec, bilateral, 7.5-lb ankle weights   Straight leg raise; 2x10 each LE, supine, with 3-lb ankle  weights [tactile cueing for maintaining quadriceps set]   Glute Bridge; 2x8    *not today* Ambulate lap around gym x1 with FWW; CGA Quad set; 2x10, 5 sec, bilateral     *not today* Cold pack (unbilled) - for anti-inflammatory and analgesic effect as needed for reduced pain and improved ability to participate in active PT intervention, along bilateral in supine, x 5 minutes       ASSESSMENT Patient has marked knee flexion deficit in standard position for goniometry, though she can access larger ROM in sitting position. She is able to ambulate in clinic with single-point cane with no  significant LOB or buckling - she does exhibit moderate antalgic pattern with decreased stance time on RLE. She has been able to significantly progress muscular demands of quadriceps, hamstrings, and gluteal strengthening. Held minisquat today due to patient performing this at home and prioritizing time to work on gait with less-restrictive AD. Patient has remaining deficits in bilateral knee ROM, decreased R ankle dorsiflexion mobility (non-specific R knee pain response), decreased hip and quad/hamstring strength, significant gait deviations, and anterior knee hyperaglesia. Patient will benefit from continued skilled therapeutic intervention to address the above deficits as needed for improved function and QoL.       PT Short Term Goals - 03/18/21 1049       PT SHORT TERM GOAL #1   Title Pt will be independent with HEP in order to decrease knee pain and increase strength in order to improve pain-free function at home and work.    Baseline 03/18/21: HEP to be initiated next visit    Time 3    Period Weeks    Status New    Target Date 04/08/21      PT SHORT TERM GOAL #2   Title Patient will perform sit to stand from standard chair without significant UE support and no increase in baseline pain indicative of improved functional LE strength as needed to prevent fall during episode of LOB and improved ability to transfer    Baseline 03/18/21: Significant difficulty and heavy UE suppot with weight shift to LLE for transferring    Time 6    Period Weeks    Status New    Target Date 04/29/21               PT Long Term Goals - 03/25/21 0830       PT LONG TERM GOAL #1   Title Patient will demonstrate improved function as evidenced by a score of 57 on FOTO measure for full participation in activities at home and in the community.    Baseline 03/18/21: 47    Time 10    Period Weeks    Status New    Target Date 05/27/21      PT LONG TERM GOAL #2   Title Pt will increase strength of tested  lower extremity musculature to at least 4+ MMT grade in order to demonstrate improvement in strength and function required for prolonged weightbearing activity, stair/step and obstacle negotiation    Baseline 03/18/21: Hip/knee musculature 3+ to 4- in RLE, grossly 4/5 in LLE.    Time 10    Period Weeks    Status New    Target Date 05/27/21      PT LONG TERM GOAL #3   Title Pt will decrease worst pain as reported on NPRS by at least 3 points in order to demonstrate clinically significant reduction in pain.    Baseline 03/18/21: Pain up to  10/10 at worst    Time 10    Period Weeks    Status New    Target Date 05/27/21      PT LONG TERM GOAL #4   Title Patient will ambulate up to 150 feet in hallway with least-restrictive assistive device with no LOB or lower extremity buckling and significant increase in baseline pain by 2 points or greater on NPRS - indicative of improved household-level and short-distance mobility    Baseline 03/18/21: Significant limitation with prolonged standing activity, gait deficits and notable pain with ambulation c front-wheeled walker    Time 10    Period Weeks    Status New    Target Date 05/27/21      PT LONG TERM GOAL #5   Title Patient will improve BERG by 3 points or greater indicative of decreased risk of falling and improved function for performing functional mobility ADLs and transferring    Baseline 03/18/21: BERG to be performed on visit # 2.  03/23/21: BERG 41/56    Time 10    Period Weeks    Status New    Target Date 05/27/21                   Plan - 04/21/21 1107     Clinical Impression Statement Patient has marked knee flexion deficit in standard position for goniometry, though she can access larger ROM in sitting position. She is able to ambulate in clinic with single-point cane with no significant LOB or buckling - she does exhibit moderate antalgic pattern with decreased stance time on RLE. She has been able to significantly progress  muscular demands of quadriceps, hamstrings, and gluteal strengthening. Held minisquat today due to patient performing this at home and prioritizing time to work on gait with less-restrictive AD. Patient has remaining deficits in bilateral knee ROM, decreased R ankle dorsiflexion mobility (non-specific R knee pain response), decreased hip and quad/hamstring strength, significant gait deviations, and anterior knee hyperaglesia. Patient will benefit from continued skilled therapeutic intervention to address the above deficits as needed for improved function and QoL.    Personal Factors and Comorbidities Comorbidity 3+;Time since onset of injury/illness/exacerbation;Past/Current Experience;Behavior Pattern    Comorbidities Anxiety, depression, Hx of schizoaffective disorder, thyroid disease, obesity, chronic bilateral knee pain, GERD    Examination-Activity Limitations Bed Mobility;Dressing;Stand;Stairs;Bend;Sit;Transfers;Lift;Locomotion Level;Squat    Examination-Participation Restrictions Cleaning    Stability/Clinical Decision Making Unstable/Unpredictable    Rehab Potential Fair    PT Frequency 2x / week    PT Duration Other (comment)   10 weeks   PT Treatment/Interventions Cryotherapy;Electrical Stimulation;ADLs/Self Care Home Management;Gait training;Stair training;Functional mobility training;Therapeutic activities;Therapeutic exercise;Balance training;Neuromuscular re-education;Patient/family education;Manual techniques;Passive range of motion    PT Next Visit Plan Continued with pre-gait training, low-impact lower extremity isotonics with progression to closed-chain strengthening. Postural stability training and fall risk management. Update HEP prn.    PT Home Exercise Plan Access Code [email or web address]    Consulted and Agree with Plan of Care Patient             Patient will benefit from skilled therapeutic intervention in order to improve the following deficits and impairments:  Abnormal gait,  Decreased activity tolerance, Decreased range of motion, Decreased strength, Hypomobility, Pain, Difficulty walking, Decreased balance  Visit Diagnosis: Chronic pain of both knees  Difficulty in walking, not elsewhere classified  Muscle weakness (generalized)  Personal history of fall     Problem List Patient Active Problem List   Diagnosis Date Noted  Migraine without aura and without status migrainosus, not intractable 03/05/2021   Mild intermittent asthma without complication 63/78/5885   Gastroesophageal reflux disease 12/01/2020   Hypoglycemia 12/01/2020   History of hypoglycemia 04/26/2020   History of hyperprolactinemia 04/26/2020   Rheumatoid factor positive 12/09/2019   SS-A antibody positive 10/28/2019   Chronic pain of both knees 10/28/2019   Subclinical hypothyroidism 08/02/2019   Hyperprolactinemia ([place]) 08/02/2019   Schizoaffective disorder, depressive type ([place]) 02/02/2018   Suicide attempt ([place]) 01/16/2018   Dysfunctional uterine bleeding 01/06/2018   Borderline personality disorder ([place]) 01/04/2018   PTSD (post-traumatic stress disorder) 01/03/2018   ASCUS of cervix with negative high risk HPV 08/28/2017   Malingering 05/06/2016   Breast tumor 03/05/2016   Schizoaffective disorder, bipolar type ([place]) 11/06/2014   Hypertension 09/29/2014   Valentina Gu, PT, DPT #[id number]  Eilleen Kempf, PT 04/21/2021, 11:08 AM  [facility] [address] [place], [place], [address] Phone: [phone]   Fax:  [phone]  [name] MRN: [id number] Date of Birth: [birth year]

## 2021-04-22 ENCOUNTER — Encounter: Payer: Self-pay | Admitting: Physical Therapy

## 2021-04-22 ENCOUNTER — Ambulatory Visit: Payer: Medicare Other | Admitting: Physical Therapy

## 2021-04-22 ENCOUNTER — Other Ambulatory Visit: Payer: Self-pay

## 2021-04-22 DIAGNOSIS — R262 Difficulty in walking, not elsewhere classified: Secondary | ICD-10-CM

## 2021-04-22 DIAGNOSIS — M25562 Pain in left knee: Secondary | ICD-10-CM

## 2021-04-22 DIAGNOSIS — Z9181 History of falling: Secondary | ICD-10-CM

## 2021-04-22 DIAGNOSIS — M25561 Pain in right knee: Secondary | ICD-10-CM | POA: Diagnosis not present

## 2021-04-22 DIAGNOSIS — G8929 Other chronic pain: Secondary | ICD-10-CM

## 2021-04-22 DIAGNOSIS — M6281 Muscle weakness (generalized): Secondary | ICD-10-CM

## 2021-04-22 NOTE — Therapy (Signed)
[facility] [address] [place], [place], [address] Phone: [phone]   Fax:  [phone]  Physical Therapy Treatment/ Physical Therapy Progress Note   Dates of reporting period  03/18/21   to   04/22/21   Patient Details  [name] MRN: [id number] Date of Birth: 1990-07-07 Referring Provider (PT): Rosalia Hammers, MD   Encounter Date: 04/22/2021   PT End of Session - 04/26/21 0654     Visit Number 9    Number of Visits 21    Date for PT Re-Evaluation 05/27/21    Authorization Type Medicare/Medicaid, VL based on medical necessity, no auth    Authorization Time Period Cert 32/67/12-4/58/09    Progress Note Due on Visit 10    PT Start Time 0937    PT Stop Time 1020    PT Time Calculation (min) 43 min    Equipment Utilized During Treatment Gait belt;Other (comment)   front-wheel walker (patient has her own walker)   Activity Tolerance Patient limited by pain    Behavior During Therapy Anxious              Past Medical History:  Diagnosis Date   Allergy    Anxiety    Arthritis    Asthma    Depression    Foreign body aspiration    GERD (gastroesophageal reflux disease)    Hallucinations 09/30/2014   Sizoaffective   Hyperlipidemia    Hypertension    Intentional ingestion of batteries 04/21/2016    2 AAA batteries and 1 thumb tack; intent to hurt herself/notes 98/07/3823   Self-inflicted injury 05/39/7673   Suicidal ideation 01/02/2018   Tardive dyskinesia 10/2014   recent onset   Thyroid disease     Past Surgical History:  Procedure Laterality Date   ABDOMINAL SURGERY     "years ago" to remove foreign objects   APPENDECTOMY     BREAST EXCISIONAL BIOPSY Right 09/03/2007   surgical bx removed fibroadeoma   BREAST LUMPECTOMY Right    COLONOSCOPY WITH PROPOFOL N/A 09/10/2015   Procedure: COLONOSCOPY WITH PROPOFOL;  Surgeon: [name], MD;  Location: [facility] ENDOSCOPY;  Service:  Endoscopy;  Laterality: N/A;   ESOPHAGOGASTRODUODENOSCOPY N/A 11/28/2014   Procedure: ESOPHAGOGASTRODUODENOSCOPY (EGD);  Surgeon: [name], MD;  Location: [facility] ENDOSCOPY;  Service: Endoscopy;  Laterality: N/A;   ESOPHAGOGASTRODUODENOSCOPY N/A 02/21/2016   Procedure: ESOPHAGOGASTRODUODENOSCOPY (EGD);  Surgeon: Mauri Pole, MD;  Location: [facility] ENDOSCOPY;  Service: Endoscopy;  Laterality: N/A;   ESOPHAGOGASTRODUODENOSCOPY N/A 04/21/2016   Procedure: ESOPHAGOGASTRODUODENOSCOPY (EGD);  Surgeon: Gatha Mayer, MD;  Location: [facility] ENDOSCOPY;  Service: Endoscopy;  Laterality: N/A;   ESOPHAGOGASTRODUODENOSCOPY N/A 05/06/2016   Procedure: ESOPHAGOGASTRODUODENOSCOPY (EGD);  Surgeon: Jonathon Bellows, MD;  Location: [facility] ENDOSCOPY;  Service: Gastroenterology;  Laterality: N/A;   ESOPHAGOGASTRODUODENOSCOPY N/A 06/13/2016   Procedure: ESOPHAGOGASTRODUODENOSCOPY (EGD);  Surgeon: [name], MD;  Location: [facility] ENDOSCOPY;  Service: Endoscopy;  Laterality: N/A;   ESOPHAGOGASTRODUODENOSCOPY (EGD) WITH PROPOFOL N/A 02/29/2016   Procedure: ESOPHAGOGASTRODUODENOSCOPY (EGD) WITH PROPOFOL;  Surgeon: [name], MD;  Location: [facility] ENDOSCOPY;  Service: Endoscopy;  Laterality: N/A;   ESOPHAGOGASTRODUODENOSCOPY (EGD) WITH PROPOFOL N/A 01/15/2018   Procedure: ESOPHAGOGASTRODUODENOSCOPY (EGD) WITH PROPOFOL;  Surgeon: Daneil Dolin, MD;  Location: [facility] ENDO SUITE;  Service: Endoscopy;  Laterality: N/A;  retrieval forein body   LAPAROTOMY N/A 09/12/2015   Procedure: EXPLORATORY LAPAROTOMY;  Surgeon: Florene Glen, MD;  Location: [facility] ORS;  Service: General;  Laterality: N/A;  SIGMOIDOSCOPY N/A 09/12/2015   Procedure: SIGMOIDOSCOPY;  Surgeon: Florene Glen, MD;  Location: [facility] ORS;  Service: General;  Laterality: N/A;   WISDOM TOOTH EXTRACTION      There were no vitals filed for this visit.   Subjective Assessment - 04/26/21 0654     Subjective Patient denies pain at arrival to PT. She reports no  significant complaints at arrival to PT. Patient reports significant progress to date. She states that her R knee does intermittently feel as if it might buckle, but she is able to prevent fall. She reports she is able to negotiate 3 steps to get into her home with single handrail fairly well. She feels that prolonged sitting is not as bothersome - it takes longer for onset of pain to occur. Patient reports 45% SANE score at this time.    Pertinent History Patient is a 30 year old female with involved psych medical history with primary complaint of chronic bilateral knee pain with gait instability and Hx of frequent falls. Patient has had multiple conservative treatments for knee pain including anti-inflammatories, injections. Hx of R ACL sprain and post-injury rehab. She states that recently injections helped for about 3 weeks. Patient reports notable "aching" behind her kneecap with prolonged sitting (pre-patellar pain). Patient reports ongoing pain and dysfunction since 2020. Patient has frequent falls. Patient reports about 10 falls over the last 6 months. She reports direct impact onto her knees often with her falls. Patient had isolated incident of numbness due to notable swelling after fall, but has not felt numbness or paresthesias recently. When questioned about neuro history, patient reports "changes in white matter" and associated migraines (present since [age] years old, have remained stable since then). Pt lives with her fiance. Patient has 3 steps to get into her home and pt uses railing. Patient reports she has to negotiate grass on level ground, but she can negotiate this well. Patient reports falls can happen with prolonged standing, walking, and functional reaching while standing. She states her R knee intermittently "gives out." Pt has continuous aching in her knees.    Limitations Standing;Sitting;House hold activities;Walking    Diagnostic tests MRI and knee radiograph - see below    Patient  Stated Goals Improved ability to walk, less falls, improved knee pain                  OBJECTIVE FINDINGS   Gait With FWW: minimal lean onto FWW, upright posture, good heel to toe progression  Without FWW: no upper limb support, slowed gait velocity, decreased gait velocity, decreased heel strike at initial contact without AD    Palpation Tenderness to palpation along R>L MCL and LCL, lateral joint line, patellar tendon, quadriceps tendon   Strength R/L 4+/4+ Hip flexion 4+/5 Hip external rotation 5/5 Hip internal rotation  Hip abduction 5/5 Hip adduction 4+/4+ Knee extension 5/5 Knee flexion 5/5 Ankle Dorsiflexion 5/5 Ankle Plantarflexion 5/5 Ankle Inversion 5/5 Ankle Eversion *indicates pain   AROM   Knee R/L Flexion: 92/109  Extension: -15/-15 *indicates pain   PROM Measured at edge of table in sitting Knee R/L Flexion 93, L 106    PROM Ankle Dorsiflexion: R 21, L 21 *Indicates Pain         TREATMENT    Therapeutic Activities  Re-assessment performed (see above and updated Goal section below)   Neuromuscular Re-education - for postural stability/postural control, gluteal musculature activation and exercises to promote LE kinetic chain stability, dynamic gait stability  In // bars: Toe tapping to 6-inch step with stance on Airex; 2x10, alternating   Gait with SPC with cueing for cane/foot placement; no cues required for correct sequencing; x 1 lap around gym   Sidestepping; 4x D/B, no UE support with Green Tband around thighs superior to patellae (increased resistance next visit*)    *next visit* Hurdle step over blue line; x10 bilateral LE    *not today* Toe tapping to 12-inch step (two steps stacked); 1x10 alternating [report of R knee pain toward end of set] [place] 0-30 deg; 2x10 Forward and retro stepping; 4x D/B, no UE support Standing march; 2x10 alt, no UE support Standing knee flexion, alternating; 1x15,  standing in walker  Standing heel raise/toe raise; 20x alternating PF/DF Sidelying Clamshell; 1x10, on each side (for HEP review)     Therapeutic Exercise - for improved soft tissue flexibility and extensibility as needed for ROM, improved strength as needed to improve performance of CKC activities/functional movements     NuStep x 5 minutes, Level 2; for knee AAROM and warm-up for improved soft tissue mobility/extensibility and muscle performance - subjective information gathered during this time        *not today* Seated knee extension; 2x10, 5 sec, bilateral, 7.5-lb ankle weights Straight leg raise; 2x10 each LE, supine, with 3-lb ankle weights [tactile cueing for maintaining quadriceps set] Glute Bridge; 2x8 Ambulate lap around gym x1 with FWW; CGA Quad set; 2x10, 5 sec, bilateral     *not today* Cold pack (unbilled) - for anti-inflammatory and analgesic effect as needed for reduced pain and improved ability to participate in active PT intervention, along bilateral in supine, x 5 minutes       ASSESSMENT Patient subjectively feels that weightbearing and ambulatory activity has gotten easier since outset of PT. She feels that she is tolerating seated activity longer prior to onset of R>L knee pain. She exhibits normal ankle dorsiflexion bilaterally at this time. She has significantly improved numeric pain rating scale. She has been compliant with her HEP and demonstrates/verbalizes sound understanding of all exercises given. She has normal ankle dorsiflexion ROM and improved knee ROM. Patient has notably improved hip and quadriceps strength. She has met FOTO score long-term goal and strength goal. She has been able to progress to gait with single-point cane versus front-wheel walker in clinic with safe gait pattern not (albeit mild antalgic pattern for R lower limb and decreased gait velocity). 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 Patient will benefit from continued skilled therapeutic intervention to address the above deficits as needed for improved function and QoL.       PT Short Term Goals - 04/25/21 0829       PT SHORT TERM GOAL #1   Title Pt will be independent with HEP in order to decrease knee pain and increase strength in order to improve pain-free function at home and work.    Baseline 03/18/21: HEP to be initiated next visit.   04/22/21: Patient is compliant with home exercise program and verbalizes and demonstrates sound understanding of exercises.    Time 3    Period Weeks    Status Achieved    Target Date 04/08/21      PT SHORT TERM GOAL #2   Title Patient will perform sit to stand from standard chair without significant UE support and no increase in baseline pain indicative  of improved functional LE strength as needed to prevent fall during episode of LOB and improved ability to transfer    Baseline 03/18/21: Significant difficulty and heavy UE suppot with weight shift to LLE for transferring.   04/22/21: Moderate UE support required for sit to stand from standard height chair; she can perform sit to stand without UE support from raised surface.    Time 6    Period Weeks    Status Not Met    Target Date 05/06/21               PT Long Term Goals - 04/22/21 1011       PT LONG TERM GOAL #1   Title Patient will demonstrate improved function as evidenced by a score of 57 on FOTO measure for full participation in activities at home and in the community.    Baseline 03/18/21: 47.     04/22/21: 67    Time 10    Period Weeks    Status Achieved    Target Date 05/27/21      PT LONG TERM GOAL #2   Title Pt will increase strength of tested lower extremity musculature to at least 4+ MMT grade in order to demonstrate improvement in strength and function required for  prolonged weightbearing activity, stair/step and obstacle negotiation    Baseline 03/18/21: Hip/knee musculature 3+ to 4- in RLE, grossly 4/5 in LLE.  04/22/21: Met for all LE muscle groups tested.    Time 10    Period Weeks    Status Achieved    Target Date 05/27/21      PT LONG TERM GOAL #3   Title Pt will decrease worst pain as reported on NPRS by at least 3 points in order to demonstrate clinically significant reduction in pain.    Baseline 03/18/21: Pain up to 10/10 at worst.    04/22/21: Pain up to 3/10 over the previous week.    Time 10    Period Weeks    Status Achieved    Target Date 05/27/21      PT LONG TERM GOAL #4   Title Patient will ambulate up to 150 feet in hallway with least-restrictive assistive device with no LOB or lower extremity buckling and significant increase in baseline pain by 2 points or greater on NPRS - indicative of improved household-level and short-distance mobility    Baseline 03/18/21: Significant limitation with prolonged standing activity, gait deficits and notable pain with ambulation c front-wheeled walker.   04/22/21: Ambulated 80 feet with SPC only with mild decreased stance time RLE and decreased gait velocity.    Time 10    Period Weeks    Status Partially Met    Target Date 05/27/21      PT LONG TERM GOAL #5   Title Patient will improve BERG by 3 points or greater indicative of decreased risk of falling and improved function for performing functional mobility ADLs and transferring    Baseline 03/18/21: BERG to be performed on visit # 2.  03/23/21: BERG 41/56.  04/22/21: To be performed next visit.    Time 10    Period Weeks    Status Deferred    Target Date 05/27/21                   Plan - 04/26/21 3790     Clinical Impression Statement Patient subjectively feels that weightbearing and ambulatory activity has gotten easier since outset of PT. She feels that she  is tolerating seated activity longer prior to onset of R>L knee pain. She  exhibits normal ankle dorsiflexion bilaterally at this time. She has significantly improved numeric pain rating scale. She has been compliant with her HEP and demonstrates/verbalizes sound understanding of all exercises given. She has normal ankle dorsiflexion ROM and improved knee ROM. Patient has notably improved hip and quadriceps strength. She has met FOTO score long-term goal and strength goal. She has been able to progress to gait with single-point cane versus front-wheel walker in clinic with safe gait pattern not (albeit mild antalgic pattern for R lower limb and decreased gait velocity). 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 Patient will benefit from continued skilled therapeutic intervention to address the above deficits as needed for improved function and QoL.    Personal Factors and Comorbidities Comorbidity 3+;Time since onset of injury/illness/exacerbation;Past/Current Experience;Behavior Pattern    Comorbidities Anxiety, depression, Hx of schizoaffective disorder, thyroid disease, obesity, chronic bilateral knee pain, GERD    Examination-Activity Limitations Bed Mobility;Dressing;Stand;Stairs;Bend;Sit;Transfers;Lift;Locomotion Level;Squat    Examination-Participation Restrictions Cleaning    Stability/Clinical Decision Making Unstable/Unpredictable    Rehab Potential Fair    PT Frequency 2x / week    PT Duration Other (comment)   10 weeks   PT Treatment/Interventions Cryotherapy;Electrical Stimulation;ADLs/Self Care Home Management;Gait training;Stair training;Functional mobility training;Therapeutic activities;Therapeutic exercise;Balance training;Neuromuscular re-education;Patient/family education;Manual techniques;Passive range of motion    PT Next Visit Plan Complete BERG, TUG, ABC scale next visit. Continued with  gait training with LRAD, low-impact lower extremity isotonics with progression to closed-chain strengthening. Postural stability training and fall risk management.    PT Home Exercise Plan Access Code [email or web address]    Consulted and Agree with Plan of Care Patient             Patient will benefit from skilled therapeutic intervention in order to improve the following deficits and impairments:  Abnormal gait, Decreased activity tolerance, Decreased range of motion, Decreased strength, Hypomobility, Pain, Difficulty walking, Decreased balance  Visit Diagnosis: Chronic pain of both knees  Difficulty in walking, not elsewhere classified  Muscle weakness (generalized)  Personal history of fall     Problem List Patient Active Problem List   Diagnosis Date Noted   Migraine without aura and without status migrainosus, not intractable 03/05/2021   Mild intermittent asthma without complication 38/46/6599   Gastroesophageal reflux disease 12/01/2020   Hypoglycemia 12/01/2020   History of hypoglycemia 04/26/2020   History of hyperprolactinemia 04/26/2020   Rheumatoid factor positive 12/09/2019   SS-A antibody positive 10/28/2019   Chronic pain of both knees 10/28/2019   Subclinical hypothyroidism 08/02/2019   Hyperprolactinemia ([place]) 08/02/2019   Schizoaffective disorder, depressive type ([place]) 02/02/2018   Suicide attempt ([place]) 01/16/2018   Dysfunctional uterine bleeding 01/06/2018   Borderline personality disorder ([place]) 01/04/2018   PTSD (post-traumatic stress disorder) 01/03/2018   ASCUS of cervix with negative high risk HPV 08/28/2017   Malingering 05/06/2016   Breast tumor 03/05/2016   Schizoaffective disorder, bipolar type ([place]) 11/06/2014   Hypertension 09/29/2014   Valentina Gu, PT, DPT #[id number]  Eilleen Kempf, PT 04/26/2021, 6:57 AM  [facility] [address] [place], [place], [address] Phone: [phone]   Fax:   [phone]  [name] MRN: [id number] Date of Birth: 07-29-1990

## 2021-04-24 NOTE — Addendum Note (Signed)
Addended by: Valentina Gu T on: 04/24/2021 10:39 AM   Modules accepted: Orders

## 2021-04-27 ENCOUNTER — Other Ambulatory Visit: Payer: Self-pay

## 2021-04-27 ENCOUNTER — Ambulatory Visit: Payer: Medicare Other | Admitting: Physical Therapy

## 2021-04-27 ENCOUNTER — Encounter: Payer: Self-pay | Admitting: Physical Therapy

## 2021-04-27 DIAGNOSIS — Z9181 History of falling: Secondary | ICD-10-CM

## 2021-04-27 DIAGNOSIS — M25561 Pain in right knee: Secondary | ICD-10-CM | POA: Diagnosis not present

## 2021-04-27 DIAGNOSIS — G8929 Other chronic pain: Secondary | ICD-10-CM

## 2021-04-27 DIAGNOSIS — R262 Difficulty in walking, not elsewhere classified: Secondary | ICD-10-CM

## 2021-04-27 DIAGNOSIS — M6281 Muscle weakness (generalized): Secondary | ICD-10-CM

## 2021-04-27 NOTE — Therapy (Signed)
[facility] [address] [place], [place], [address] Phone: [phone]   Fax:  [phone]  Physical Therapy Treatment  Patient Details  [name] MRN: [id number] Date of Birth: 02-07-1991 Referring Provider (PT): Rosalia Hammers, MD   Encounter Date: 04/27/2021   PT End of Session - 04/27/21 0924     Visit Number 10    Number of Visits 21    Date for PT Re-Evaluation 05/27/21    Authorization Type Medicare/Medicaid, VL based on medical necessity, no auth    Authorization Time Period Cert 75/10/25-8/52/77, prog note 04/22/21    Progress Note Due on Visit 19    PT Start Time 0921    PT Stop Time 1010    PT Time Calculation (min) 49 min    Equipment Utilized During Treatment Gait belt;Other (comment)   front-wheel walker (patient has her own walker)   Activity Tolerance Patient limited by pain    Behavior During Therapy [facility] for tasks assessed/performed             Past Medical History:  Diagnosis Date   Allergy    Anxiety    Arthritis    Asthma    Depression    Foreign body aspiration    GERD (gastroesophageal reflux disease)    Hallucinations 09/30/2014   Sizoaffective   Hyperlipidemia    Hypertension    Intentional ingestion of batteries 04/21/2016    2 AAA batteries and 1 thumb tack; intent to hurt herself/notes 82/08/2351   Self-inflicted injury 61/44/3154   Suicidal ideation 01/02/2018   Tardive dyskinesia 10/2014   recent onset   Thyroid disease     Past Surgical History:  Procedure Laterality Date   ABDOMINAL SURGERY     "years ago" to remove foreign objects   APPENDECTOMY     BREAST EXCISIONAL BIOPSY Right 09/03/2007   surgical bx removed fibroadeoma   BREAST LUMPECTOMY Right    COLONOSCOPY WITH PROPOFOL N/A 09/10/2015   Procedure: COLONOSCOPY WITH PROPOFOL;  Surgeon: [name], MD;  Location: [facility] ENDOSCOPY;  Service: Endoscopy;  Laterality: N/A;    ESOPHAGOGASTRODUODENOSCOPY N/A 11/28/2014   Procedure: ESOPHAGOGASTRODUODENOSCOPY (EGD);  Surgeon: [name], MD;  Location: [facility] ENDOSCOPY;  Service: Endoscopy;  Laterality: N/A;   ESOPHAGOGASTRODUODENOSCOPY N/A 02/21/2016   Procedure: ESOPHAGOGASTRODUODENOSCOPY (EGD);  Surgeon: Mauri Pole, MD;  Location: [facility] ENDOSCOPY;  Service: Endoscopy;  Laterality: N/A;   ESOPHAGOGASTRODUODENOSCOPY N/A 04/21/2016   Procedure: ESOPHAGOGASTRODUODENOSCOPY (EGD);  Surgeon: Gatha Mayer, MD;  Location: [facility] ENDOSCOPY;  Service: Endoscopy;  Laterality: N/A;   ESOPHAGOGASTRODUODENOSCOPY N/A 05/06/2016   Procedure: ESOPHAGOGASTRODUODENOSCOPY (EGD);  Surgeon: Jonathon Bellows, MD;  Location: [facility] ENDOSCOPY;  Service: Gastroenterology;  Laterality: N/A;   ESOPHAGOGASTRODUODENOSCOPY N/A 06/13/2016   Procedure: ESOPHAGOGASTRODUODENOSCOPY (EGD);  Surgeon: [name], MD;  Location: [facility] ENDOSCOPY;  Service: Endoscopy;  Laterality: N/A;   ESOPHAGOGASTRODUODENOSCOPY (EGD) WITH PROPOFOL N/A 02/29/2016   Procedure: ESOPHAGOGASTRODUODENOSCOPY (EGD) WITH PROPOFOL;  Surgeon: [name], MD;  Location: [facility] ENDOSCOPY;  Service: Endoscopy;  Laterality: N/A;   ESOPHAGOGASTRODUODENOSCOPY (EGD) WITH PROPOFOL N/A 01/15/2018   Procedure: ESOPHAGOGASTRODUODENOSCOPY (EGD) WITH PROPOFOL;  Surgeon: Daneil Dolin, MD;  Location: [facility] ENDO SUITE;  Service: Endoscopy;  Laterality: N/A;  retrieval forein body   LAPAROTOMY N/A 09/12/2015   Procedure: EXPLORATORY LAPAROTOMY;  Surgeon: Florene Glen, MD;  Location: [facility] ORS;  Service: General;  Laterality: N/A;   SIGMOIDOSCOPY N/A 09/12/2015   Procedure: Lonell Face;  Surgeon: Florene Glen, MD;  Location: [facility] ORS;  Service: General;  Laterality: N/A;   WISDOM TOOTH EXTRACTION      There were no vitals filed for this visit.   Subjective Assessment - 04/27/21 0923     Subjective Patient reports that her new Rx is helping. She reports feeling mild aching in her knees at  arrival; she reports 2/10 pain at arrival. She reports feeling well today. Patient is compliant with HEP.    Pertinent History Patient is a 30 year old female with involved psych medical history with primary complaint of chronic bilateral knee pain with gait instability and Hx of frequent falls. Patient has had multiple conservative treatments for knee pain including anti-inflammatories, injections. Hx of R ACL sprain and post-injury rehab. She states that recently injections helped for about 3 weeks. Patient reports notable "aching" behind her kneecap with prolonged sitting (pre-patellar pain). Patient reports ongoing pain and dysfunction since 2020. Patient has frequent falls. Patient reports about 10 falls over the last 6 months. She reports direct impact onto her knees often with her falls. Patient had isolated incident of numbness due to notable swelling after fall, but has not felt numbness or paresthesias recently. When questioned about neuro history, patient reports "changes in white matter" and associated migraines (present since [age] years old, have remained stable since then). Pt lives with her fiance. Patient has 3 steps to get into her home and pt uses railing. Patient reports she has to negotiate grass on level ground, but she can negotiate this well. Patient reports falls can happen with prolonged standing, walking, and functional reaching while standing. She states her R knee intermittently "gives out." Pt has continuous aching in her knees.    Limitations Standing;Sitting;House hold activities;Walking    Diagnostic tests MRI and knee radiograph - see below    Patient Stated Goals Improved ability to walk, less falls, improved knee pain              [facility] PT Assessment - 04/27/21 0931       Berg Balance Test   Sit to Stand Able to stand without using hands and stabilize independently    Standing Unsupported Able to stand safely 2 minutes    Sitting with Back Unsupported but Feet  Supported on Floor or Stool Able to sit safely and securely 2 minutes    Stand to Sit Sits safely with minimal use of hands    Transfers Able to transfer with verbal cueing and /or supervision    Standing Unsupported with Eyes Closed Able to stand 10 seconds safely    Standing Unsupported with Feet Together Able to place feet together independently and stand 1 minute safely    From Standing, Reach Forward with Outstretched Arm Can reach confidently >25 cm (10")    From Standing Position, Pick up Object from Floor Able to pick up shoe safely and easily    From Standing Position, Turn to Look Behind Over each Shoulder Looks behind from both sides and weight shifts well    Turn 360 Degrees Able to turn 360 degrees safely but slowly    Standing Unsupported, Alternately Place Feet on Step/Stool Able to stand independently and safely and complete 8 steps in 20 seconds    Standing Unsupported, One Foot in Front Able to place foot tandem independently and hold 30 seconds    Standing on One Leg Able to lift leg independently and hold > 10 seconds    Total Score 52  Functional Outcome Measures  BERG: 52 Timed up and Go: 30 sec ABC Scale: 78.75%     TREATMENT     Physical Performance Testing  -Performance of functional outcome measures noted above Patient education on results of outcome measures/testing and benefit of continued PT to address noted impairments      Neuromuscular Re-education - for postural stability/postural control, gluteal musculature activation and exercises to promote LE kinetic chain stability, dynamic gait stability     In // bars: Toe tapping to two stacked 6-inch steps with stance on Airex; 2x10, alternating Sidestepping; 4x D/B, no UE support with BlueTband around thighs superior to patellae  Hurdle step over 3 6-inch hurdles; 4x D/B   Gait with SPC with cueing for cane/foot placement; no cues required for correct sequencing; x 1 lap around gym         *not today* Toe tapping to 12-inch step (two steps stacked); 1x10 alternating [report of R knee pain toward end of set] [place]quat 0-30 deg; 2x10 Forward and retro stepping; 4x D/B, no UE support Standing march; 2x10 alt, no UE support Standing knee flexion, alternating; 1x15, standing in walker  Standing heel raise/toe raise; 20x alternating PF/DF Sidelying Clamshell; 1x10, on each side (for HEP review)     Therapeutic Exercise - for improved soft tissue flexibility and extensibility as needed for ROM, improved strength as needed to improve performance of CKC activities/functional movements     NuStep x 5 minutes, Level 3; for knee AAROM and warm-up for improved soft tissue mobility/extensibility and muscle performance - subjective information gathered during this time   Sit to stand with raised table; 2x8     *not today* Seated knee extension; 2x10, 5 sec, bilateral, 7.5-lb ankle weights Straight leg raise; 2x10 each LE, supine, with 3-lb ankle weights [tactile cueing for maintaining quadriceps set] Glute Bridge; 2x8 Ambulate lap around gym x1 with FWW; CGA Quad set; 2x10, 5 sec, bilateral     *not today* Cold pack (unbilled) - for anti-inflammatory and analgesic effect as needed for reduced pain and improved ability to participate in active PT intervention, along bilateral in supine, x 5 minutes       ASSESSMENT Patient has clinically significant improvement in BERG and ABC scale. She has markedly reduced duration for TUG; however, she has not yet met established cut-off score. Updated BERG lon-term goal today for full update of long-term goals. Patient tolerates progression of CKC loading and postural stability work well today. She is continuing to progress with gait c LRAD in clinic and will likely be able to safely wean from [facility] over the next week and utilize single-point cane. 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 Patient will benefit from continued skilled therapeutic intervention to address the above deficits as needed for improved function and QoL.      PT Short Term Goals - 04/25/21 0829       PT SHORT TERM GOAL #1   Title Pt will be independent with HEP in order to decrease knee pain and increase strength in order to improve pain-free function at home and work.    Baseline 03/18/21: HEP to be initiated next visit.   04/22/21: Patient is compliant with home exercise program and verbalizes and demonstrates sound understanding of exercises.    Time 3    Period Weeks    Status Achieved    Target  Date 04/08/21      PT SHORT TERM GOAL #2   Title Patient will perform sit to stand from standard chair without significant UE support and no increase in baseline pain indicative of improved functional LE strength as needed to prevent fall during episode of LOB and improved ability to transfer    Baseline 03/18/21: Significant difficulty and heavy UE suppot with weight shift to LLE for transferring.   04/22/21: Moderate UE support required for sit to stand from standard height chair; she can perform sit to stand without UE support from raised surface.    Time 6    Period Weeks    Status Not Met    Target Date 05/06/21               PT Long Term Goals - 04/22/21 1011       PT LONG TERM GOAL #1   Title Patient will demonstrate improved function as evidenced by a score of 57 on FOTO measure for full participation in activities at home and in the community.    Baseline 03/18/21: 47.     04/22/21: 67    Time 10    Period Weeks    Status Achieved    Target Date 05/27/21      PT LONG TERM GOAL #2   Title Pt will increase strength of tested lower extremity musculature to at least 4+ MMT grade in order to demonstrate improvement in strength and function required for prolonged  weightbearing activity, stair/step and obstacle negotiation    Baseline 03/18/21: Hip/knee musculature 3+ to 4- in RLE, grossly 4/5 in LLE.  04/22/21: Met for all LE muscle groups tested.    Time 10    Period Weeks    Status Achieved    Target Date 05/27/21      PT LONG TERM GOAL #3   Title Pt will decrease worst pain as reported on NPRS by at least 3 points in order to demonstrate clinically significant reduction in pain.    Baseline 03/18/21: Pain up to 10/10 at worst.    04/22/21: Pain up to 3/10 over the previous week.    Time 10    Period Weeks    Status Achieved    Target Date 05/27/21      PT LONG TERM GOAL #4   Title Patient will ambulate up to 150 feet in hallway with least-restrictive assistive device with no LOB or lower extremity buckling and significant increase in baseline pain by 2 points or greater on NPRS - indicative of improved household-level and short-distance mobility    Baseline 03/18/21: Significant limitation with prolonged standing activity, gait deficits and notable pain with ambulation c front-wheeled walker.   04/22/21: Ambulated 80 feet with SPC only with mild decreased stance time RLE and decreased gait velocity.    Time 10    Period Weeks    Status Partially Met    Target Date 05/27/21      PT LONG TERM GOAL #5   Title Patient will improve BERG by 3 points or greater indicative of decreased risk of falling and improved function for performing functional mobility ADLs and transferring    Baseline 03/18/21: BERG to be performed on visit # 2.  03/23/21: BERG 41/56.  04/22/21: To be performed next visit.    Time 10    Period Weeks    Status Deferred    Target Date 05/27/21  Plan - 04/28/21 1034     Clinical Impression Statement Patient has clinically significant improvement in BERG and ABC scale. She has markedly reduced duration for TUG; however, she has not yet met established cut-off score. Updated BERG lon-term goal today for full  update of long-term goals. Patient tolerates progression of CKC loading and postural stability work well today. She is continuing to progress with gait c LRAD in clinic and will likely be able to safely wean from [facility] over the next week and utilize single-point cane. 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 Patient will benefit from continued skilled therapeutic intervention to address the above deficits as needed for improved function and QoL.    Personal Factors and Comorbidities Comorbidity 3+;Time since onset of injury/illness/exacerbation;Past/Current Experience;Behavior Pattern    Comorbidities Anxiety, depression, Hx of schizoaffective disorder, thyroid disease, obesity, chronic bilateral knee pain, GERD    Examination-Activity Limitations Bed Mobility;Dressing;Stand;Stairs;Bend;Sit;Transfers;Lift;Locomotion Level;Squat    Examination-Participation Restrictions Cleaning    Stability/Clinical Decision Making Unstable/Unpredictable    Rehab Potential Fair    PT Frequency 2x / week    PT Duration Other (comment)   10 weeks   PT Treatment/Interventions Cryotherapy;Electrical Stimulation;ADLs/Self Care Home Management;Gait training;Stair training;Functional mobility training;Therapeutic activities;Therapeutic exercise;Balance training;Neuromuscular re-education;Patient/family education;Manual techniques;Passive range of motion    PT Next Visit Plan Continued with gait training with LRAD, low-impact lower extremity isotonics with increasing emphasis on closed-chain strengthening. Postural stability training and fall risk management.    PT Home Exercise Plan Access Code [email or web address]    Consulted and Agree with Plan of Care Patient             Patient will benefit from skilled therapeutic intervention in order to improve the  following deficits and impairments:  Abnormal gait, Decreased activity tolerance, Decreased range of motion, Decreased strength, Hypomobility, Pain, Difficulty walking, Decreased balance  Visit Diagnosis: Chronic pain of both knees  Difficulty in walking, not elsewhere classified  Muscle weakness (generalized)  Personal history of fall     Problem List Patient Active Problem List   Diagnosis Date Noted   Migraine without aura and without status migrainosus, not intractable 03/05/2021   Mild intermittent asthma without complication 63/81/7711   Gastroesophageal reflux disease 12/01/2020   Hypoglycemia 12/01/2020   History of hypoglycemia 04/26/2020   History of hyperprolactinemia 04/26/2020   Rheumatoid factor positive 12/09/2019   SS-A antibody positive 10/28/2019   Chronic pain of both knees 10/28/2019   Subclinical hypothyroidism 08/02/2019   Hyperprolactinemia ([place]) 08/02/2019   Schizoaffective disorder, depressive type ([place]) 02/02/2018   Suicide attempt ([place]) 01/16/2018   Dysfunctional uterine bleeding 01/06/2018   Borderline personality disorder ([place]) 01/04/2018   PTSD (post-traumatic stress disorder) 01/03/2018   ASCUS of cervix with negative high risk HPV 08/28/2017   Malingering 05/06/2016   Breast tumor 03/05/2016   Schizoaffective disorder, bipolar type ([place]) 11/06/2014   Hypertension 09/29/2014   Valentina Gu, PT, DPT #[id number]  Eilleen Kempf, PT 04/28/2021, 10:34 AM  [facility] [address] [place], [place], [address] Phone: [phone]   Fax:  [phone]  Name: Azaliyah Kennard MRN: [id number] Date of Birth: [birth year]

## 2021-04-29 ENCOUNTER — Encounter: Payer: Self-pay | Admitting: Internal Medicine

## 2021-04-29 ENCOUNTER — Ambulatory Visit: Payer: Medicare Other | Admitting: Physical Therapy

## 2021-04-29 ENCOUNTER — Other Ambulatory Visit: Payer: Self-pay

## 2021-04-29 ENCOUNTER — Encounter: Payer: Self-pay | Admitting: Physical Therapy

## 2021-04-29 DIAGNOSIS — M25562 Pain in left knee: Secondary | ICD-10-CM

## 2021-04-29 DIAGNOSIS — M6281 Muscle weakness (generalized): Secondary | ICD-10-CM

## 2021-04-29 DIAGNOSIS — Z9181 History of falling: Secondary | ICD-10-CM

## 2021-04-29 DIAGNOSIS — R262 Difficulty in walking, not elsewhere classified: Secondary | ICD-10-CM

## 2021-04-29 DIAGNOSIS — M25561 Pain in right knee: Secondary | ICD-10-CM | POA: Diagnosis not present

## 2021-04-29 NOTE — Therapy (Signed)
[facility] [address] [place], [place], [address] Phone: [phone]   Fax:  [phone]  Physical Therapy Treatment  Patient Details  Name: Unnamed Hino MRN: [id number] Date of Birth: 05/08/1991 Referring Provider (PT): Rosalia Hammers, MD   Encounter Date: 04/29/2021   PT End of Session - 04/29/21 1021     Visit Number 11    Number of Visits 21    Date for PT Re-Evaluation 05/27/21    Authorization Type Medicare/Medicaid, VL based on medical necessity, no auth    Authorization Time Period Cert 40/08/67-11/01/48, prog note 04/22/21    Progress Note Due on Visit 19    PT Start Time 0957    PT Stop Time 1045    PT Time Calculation (min) 48 min    Equipment Utilized During Treatment Gait belt;Other (comment)   front-wheel walker (patient has her own walker), SPC   Activity Tolerance Patient limited by pain    Behavior During Therapy [facility] for tasks assessed/performed             Past Medical History:  Diagnosis Date   Allergy    Anxiety    Arthritis    Asthma    Depression    Foreign body aspiration    GERD (gastroesophageal reflux disease)    Hallucinations 09/30/2014   Sizoaffective   Hyperlipidemia    Hypertension    Intentional ingestion of batteries 04/21/2016    2 AAA batteries and 1 thumb tack; intent to hurt herself/notes 93/06/6710   Self-inflicted injury 45/80/9983   Suicidal ideation 01/02/2018   Tardive dyskinesia 10/2014   recent onset   Thyroid disease     Past Surgical History:  Procedure Laterality Date   ABDOMINAL SURGERY     "years ago" to remove foreign objects   APPENDECTOMY     BREAST EXCISIONAL BIOPSY Right 09/03/2007   surgical bx removed fibroadeoma   BREAST LUMPECTOMY Right    COLONOSCOPY WITH PROPOFOL N/A 09/10/2015   Procedure: COLONOSCOPY WITH PROPOFOL;  Surgeon: [name], MD;  Location: [facility] ENDOSCOPY;  Service: Endoscopy;  Laterality: N/A;    ESOPHAGOGASTRODUODENOSCOPY N/A 11/28/2014   Procedure: ESOPHAGOGASTRODUODENOSCOPY (EGD);  Surgeon: [name], MD;  Location: [facility] ENDOSCOPY;  Service: Endoscopy;  Laterality: N/A;   ESOPHAGOGASTRODUODENOSCOPY N/A 02/21/2016   Procedure: ESOPHAGOGASTRODUODENOSCOPY (EGD);  Surgeon: Mauri Pole, MD;  Location: [facility] ENDOSCOPY;  Service: Endoscopy;  Laterality: N/A;   ESOPHAGOGASTRODUODENOSCOPY N/A 04/21/2016   Procedure: ESOPHAGOGASTRODUODENOSCOPY (EGD);  Surgeon: Gatha Mayer, MD;  Location: [facility] ENDOSCOPY;  Service: Endoscopy;  Laterality: N/A;   ESOPHAGOGASTRODUODENOSCOPY N/A 05/06/2016   Procedure: ESOPHAGOGASTRODUODENOSCOPY (EGD);  Surgeon: Jonathon Bellows, MD;  Location: [facility] ENDOSCOPY;  Service: Gastroenterology;  Laterality: N/A;   ESOPHAGOGASTRODUODENOSCOPY N/A 06/13/2016   Procedure: ESOPHAGOGASTRODUODENOSCOPY (EGD);  Surgeon: [name], MD;  Location: [facility] ENDOSCOPY;  Service: Endoscopy;  Laterality: N/A;   ESOPHAGOGASTRODUODENOSCOPY (EGD) WITH PROPOFOL N/A 02/29/2016   Procedure: ESOPHAGOGASTRODUODENOSCOPY (EGD) WITH PROPOFOL;  Surgeon: [name], MD;  Location: [facility] ENDOSCOPY;  Service: Endoscopy;  Laterality: N/A;   ESOPHAGOGASTRODUODENOSCOPY (EGD) WITH PROPOFOL N/A 01/15/2018   Procedure: ESOPHAGOGASTRODUODENOSCOPY (EGD) WITH PROPOFOL;  Surgeon: Daneil Dolin, MD;  Location: [facility] ENDO SUITE;  Service: Endoscopy;  Laterality: N/A;  retrieval forein body   LAPAROTOMY N/A 09/12/2015   Procedure: EXPLORATORY LAPAROTOMY;  Surgeon: Florene Glen, MD;  Location: [facility] ORS;  Service: General;  Laterality: N/A;   SIGMOIDOSCOPY N/A 09/12/2015   Procedure: Lonell Face;  Surgeon: Florene Glen,  MD;  Location: [facility] ORS;  Service: General;  Laterality: N/A;   WISDOM TOOTH EXTRACTION      There were no vitals filed for this visit.   Subjective Assessment - 04/29/21 1006     Subjective Patient reports minimal pain at arrival to PT this AM. She reports tolerating last session  well with only moderate muscle soreness - no increase in peripatellar knee pain. Patient reports compliance with HEP.    Pertinent History Patient is a 30 year old female with involved psych medical history with primary complaint of chronic bilateral knee pain with gait instability and Hx of frequent falls. Patient has had multiple conservative treatments for knee pain including anti-inflammatories, injections. Hx of R ACL sprain and post-injury rehab. She states that recently injections helped for about 3 weeks. Patient reports notable "aching" behind her kneecap with prolonged sitting (pre-patellar pain). Patient reports ongoing pain and dysfunction since 2020. Patient has frequent falls. Patient reports about 10 falls over the last 6 months. She reports direct impact onto her knees often with her falls. Patient had isolated incident of numbness due to notable swelling after fall, but has not felt numbness or paresthesias recently. When questioned about neuro history, patient reports "changes in white matter" and associated migraines (present since 23-45 years old, have remained stable since then). Pt lives with her fiance. Patient has 3 steps to get into her home and pt uses railing. Patient reports she has to negotiate grass on level ground, but she can negotiate this well. Patient reports falls can happen with prolonged standing, walking, and functional reaching while standing. She states her R knee intermittently "gives out." Pt has continuous aching in her knees.    Limitations Standing;Sitting;House hold activities;Walking    Diagnostic tests MRI and knee radiograph - see below    Patient Stated Goals Improved ability to walk, less falls, improved knee pain                TREATMENT     Neuromuscular Re-education - for postural stability/postural control, gluteal musculature activation and exercises to promote LE kinetic chain stability, dynamic gait stability     In // bars: Toe tapping  to two stacked 6-inch steps with stance on Airex; 2x10, alternating Hurdle step over 3 6-inch hurdles; 4x D/B Long Airex Tandem walk; 4x D/B, forward only  Gait with SPC with cueing for cane/foot placement; no cues required for correct sequencing; x 2 laps around gym  *next visit* 3 consecutive Airex pads on floor, step on/over each with; 4x D/B         *not today* Sidestepping; 4x D/B, no UE support with BlueTband around thighs superior to patellae  Toe tapping to 12-inch step (two steps stacked); 1x10 alternating [report of R knee pain toward end of set] [place] 0-30 deg; 2x10 Forward and retro stepping; 4x D/B, no UE support Standing march; 2x10 alt, no UE support Standing knee flexion, alternating; 1x15, standing in walker  Standing heel raise/toe raise; 20x alternating PF/DF Sidelying Clamshell; 1x10, on each side (for HEP review)     Therapeutic Exercise - for improved soft tissue flexibility and extensibility as needed for ROM, improved strength as needed to improve performance of CKC activities/functional movements     NuStep x 5 minutes, Level 3; for knee AAROM and warm-up for improved soft tissue mobility/extensibility and muscle performance - subjective information gathered during this time   Sit to stand with raised table; 2x10   Glute Bridge; 2x8, Grn band above knees  Seated knee extension; 2x10, 5 sec, bilateral, 7.5-lb ankle weights   *not today* Straight leg raise; 2x10 each LE, supine, with 3-lb ankle weights [tactile cueing for maintaining quadriceps set] Ambulate lap around gym x1 with FWW; CGA Quad set; 2x10, 5 sec, bilateral     *not today* Cold pack (unbilled) - for anti-inflammatory and analgesic effect as needed for reduced pain and improved ability to participate in active PT intervention, along bilateral in supine, x 5 minutes       ASSESSMENT Patient demonstrates improving gait velocity and step length with SPC. She demonstrates safe  negotiation of obstacles in clinic today and is able to progress with dynamic stability drills on compliant surfaces without notable LOB. We continued progression of open-chain and supine strengthening drills without notable pain. Pt is making excellent progress and will likely be able to wean to [facility] (versus using FWW for mobility needs) over the next 1-2 weeks. 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 Patient will benefit from continued skilled therapeutic intervention to address the above deficits as needed for improved function and QoL.       PT Short Term Goals - 04/25/21 0829       PT SHORT TERM GOAL #1   Title Pt will be independent with HEP in order to decrease knee pain and increase strength in order to improve pain-free function at home and work.    Baseline 03/18/21: HEP to be initiated next visit.   04/22/21: Patient is compliant with home exercise program and verbalizes and demonstrates sound understanding of exercises.    Time 3    Period Weeks    Status Achieved    Target Date 04/08/21      PT SHORT TERM GOAL #2   Title Patient will perform sit to stand from standard chair without significant UE support and no increase in baseline pain indicative of improved functional LE strength as needed to prevent fall during episode of LOB and improved ability to transfer    Baseline 03/18/21: Significant difficulty and heavy UE suppot with weight shift to LLE for transferring.   04/22/21: Moderate UE support required for sit to stand from standard height chair; she can perform sit to stand without UE support from raised surface.    Time 6    Period Weeks    Status Not Met    Target Date 05/06/21               PT Long Term Goals - 04/22/21 1011       PT LONG TERM GOAL #1   Title Patient will demonstrate improved  function as evidenced by a score of 57 on FOTO measure for full participation in activities at home and in the community.    Baseline 03/18/21: 47.     04/22/21: 67    Time 10    Period Weeks    Status Achieved    Target Date 05/27/21      PT LONG TERM GOAL #2   Title Pt will increase strength of tested lower extremity musculature to at least 4+ MMT grade in order to demonstrate improvement in strength and function required for prolonged weightbearing activity, stair/step and obstacle negotiation    Baseline 03/18/21: Hip/knee musculature 3+ to 4- in RLE, grossly 4/5 in LLE.  04/22/21: Met for all LE muscle groups tested.    Time 10  Period Weeks    Status Achieved    Target Date 05/27/21      PT LONG TERM GOAL #3   Title Pt will decrease worst pain as reported on NPRS by at least 3 points in order to demonstrate clinically significant reduction in pain.    Baseline 03/18/21: Pain up to 10/10 at worst.    04/22/21: Pain up to 3/10 over the previous week.    Time 10    Period Weeks    Status Achieved    Target Date 05/27/21      PT LONG TERM GOAL #4   Title Patient will ambulate up to 150 feet in hallway with least-restrictive assistive device with no LOB or lower extremity buckling and significant increase in baseline pain by 2 points or greater on NPRS - indicative of improved household-level and short-distance mobility    Baseline 03/18/21: Significant limitation with prolonged standing activity, gait deficits and notable pain with ambulation c front-wheeled walker.   04/22/21: Ambulated 80 feet with SPC only with mild decreased stance time RLE and decreased gait velocity.    Time 10    Period Weeks    Status Partially Met    Target Date 05/27/21      PT LONG TERM GOAL #5   Title Patient will improve BERG by 3 points or greater indicative of decreased risk of falling and improved function for performing functional mobility ADLs and transferring    Baseline 03/18/21: BERG to be performed  on visit # 2.  03/23/21: BERG 41/56.  04/22/21: To be performed next visit.    Time 10    Period Weeks    Status Deferred    Target Date 05/27/21                   Plan - 04/29/21 1639     Clinical Impression Statement Patient demonstrates improving gait velocity and step length with SPC. She demonstrates safe negotiation of obstacles in clinic today and is able to progress with dynamic stability drills on compliant surfaces without notable LOB. We continued progression of open-chain and supine strengthening drills without notable pain. Pt is making excellent progress and will likely be able to wean to [facility] (versus using FWW for mobility needs) over the next 1-2 weeks. 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 Patient will benefit from continued skilled therapeutic intervention to address the above deficits as needed for improved function and QoL.    Personal Factors and Comorbidities Comorbidity 3+;Time since onset of injury/illness/exacerbation;Past/Current Experience;Behavior Pattern    Comorbidities Anxiety, depression, Hx of schizoaffective disorder, thyroid disease, obesity, chronic bilateral knee pain, GERD    Examination-Activity Limitations Bed Mobility;Dressing;Stand;Stairs;Bend;Sit;Transfers;Lift;Locomotion Level;Squat    Examination-Participation Restrictions Cleaning    Stability/Clinical Decision Making Unstable/Unpredictable    Rehab Potential Fair    PT Frequency 2x / week    PT Duration Other (comment)   10 weeks   PT Treatment/Interventions Cryotherapy;Electrical Stimulation;ADLs/Self Care Home Management;Gait training;Stair training;Functional mobility training;Therapeutic activities;Therapeutic exercise;Balance training;Neuromuscular re-education;Patient/family education;Manual techniques;Passive range  of motion    PT Next Visit Plan Continued with gait training with LRAD, low-impact lower extremity isotonics with increasing emphasis on closed-chain strengthening. Postural stability training and fall risk management.    PT Home Exercise Plan Access Code [email or web address]    Consulted and Agree with Plan of Care Patient  Patient will benefit from skilled therapeutic intervention in order to improve the following deficits and impairments:  Abnormal gait, Decreased activity tolerance, Decreased range of motion, Decreased strength, Hypomobility, Pain, Difficulty walking, Decreased balance  Visit Diagnosis: Chronic pain of both knees  Difficulty in walking, not elsewhere classified  Muscle weakness (generalized)  Personal history of fall     Problem List Patient Active Problem List   Diagnosis Date Noted   Migraine without aura and without status migrainosus, not intractable 03/05/2021   Mild intermittent asthma without complication 70/03/33   Gastroesophageal reflux disease 12/01/2020   Hypoglycemia 12/01/2020   History of hypoglycemia 04/26/2020   History of hyperprolactinemia 04/26/2020   Rheumatoid factor positive 12/09/2019   SS-A antibody positive 10/28/2019   Chronic pain of both knees 10/28/2019   Subclinical hypothyroidism 08/02/2019   Hyperprolactinemia ([place]) 08/02/2019   Schizoaffective disorder, depressive type ([place]) 02/02/2018   Suicide attempt ([place]) 01/16/2018   Dysfunctional uterine bleeding 01/06/2018   Borderline personality disorder ([place]) 01/04/2018   PTSD (post-traumatic stress disorder) 01/03/2018   ASCUS of cervix with negative high risk HPV 08/28/2017   Malingering 05/06/2016   Breast tumor 03/05/2016   Schizoaffective disorder, bipolar type ([place]) 11/06/2014   Hypertension 09/29/2014   Valentina Gu, PT, DPT #[id number]  Eilleen Kempf, PT 04/29/2021, 4:39 PM  [facility] [address] [place], [place], [address] Phone: [phone]   Fax:  [phone]  Name: Ethel Meisenheimer MRN: [id number] Date of Birth: 1990/11/05

## 2021-05-03 ENCOUNTER — Other Ambulatory Visit (INDEPENDENT_AMBULATORY_CARE_PROVIDER_SITE_OTHER): Payer: Medicare Other

## 2021-05-03 ENCOUNTER — Other Ambulatory Visit: Payer: Self-pay

## 2021-05-03 DIAGNOSIS — E039 Hypothyroidism, unspecified: Secondary | ICD-10-CM

## 2021-05-03 DIAGNOSIS — Z8639 Personal history of other endocrine, nutritional and metabolic disease: Secondary | ICD-10-CM

## 2021-05-03 LAB — TSH: TSH: 1.08 u[IU]/mL (ref 0.35–5.50)

## 2021-05-04 ENCOUNTER — Ambulatory Visit: Payer: Medicare Other | Admitting: Physical Therapy

## 2021-05-04 ENCOUNTER — Other Ambulatory Visit: Payer: Self-pay

## 2021-05-04 ENCOUNTER — Other Ambulatory Visit: Payer: Self-pay | Admitting: Internal Medicine

## 2021-05-04 DIAGNOSIS — R262 Difficulty in walking, not elsewhere classified: Secondary | ICD-10-CM

## 2021-05-04 DIAGNOSIS — M25562 Pain in left knee: Secondary | ICD-10-CM

## 2021-05-04 DIAGNOSIS — M6281 Muscle weakness (generalized): Secondary | ICD-10-CM

## 2021-05-04 DIAGNOSIS — M25561 Pain in right knee: Secondary | ICD-10-CM | POA: Diagnosis not present

## 2021-05-04 DIAGNOSIS — E221 Hyperprolactinemia: Secondary | ICD-10-CM

## 2021-05-04 DIAGNOSIS — Z9181 History of falling: Secondary | ICD-10-CM

## 2021-05-04 NOTE — Therapy (Signed)
[facility] [address] [place], [place], [address] Phone: [phone]   Fax:  [phone]  Physical Therapy Treatment  Patient Details  [name] MRN: [id number] Date of Birth: Nov 03, 1990 Referring Provider (PT): Rosalia Hammers, MD   Encounter Date: 05/04/2021   PT End of Session - 05/04/21 0922     Visit Number 12    Number of Visits 21    Date for PT Re-Evaluation 05/27/21    Authorization Type Medicare/Medicaid, VL based on medical necessity, no auth    Authorization Time Period Cert 31/54/00-8/67/61, prog note 04/22/21    Progress Note Due on Visit 19    PT Start Time 0924    PT Stop Time 1008    PT Time Calculation (min) 44 min    Equipment Utilized During Treatment Gait belt;Other (comment)   front-wheel walker (patient has her own walker), SPC   Activity Tolerance Patient limited by pain    Behavior During Therapy [facility] for tasks assessed/performed             Past Medical History:  Diagnosis Date   Allergy    Anxiety    Arthritis    Asthma    Depression    Foreign body aspiration    GERD (gastroesophageal reflux disease)    Hallucinations 09/30/2014   Sizoaffective   Hyperlipidemia    Hypertension    Intentional ingestion of batteries 04/21/2016    2 AAA batteries and 1 thumb tack; intent to hurt herself/notes 95/0/9326   Self-inflicted injury 71/24/5809   Suicidal ideation 01/02/2018   Tardive dyskinesia 10/2014   recent onset   Thyroid disease     Past Surgical History:  Procedure Laterality Date   ABDOMINAL SURGERY     "years ago" to remove foreign objects   APPENDECTOMY     BREAST EXCISIONAL BIOPSY Right 09/03/2007   surgical bx removed fibroadeoma   BREAST LUMPECTOMY Right    COLONOSCOPY WITH PROPOFOL N/A 09/10/2015   Procedure: COLONOSCOPY WITH PROPOFOL;  Surgeon: [name], MD;  Location: [facility] ENDOSCOPY;  Service: Endoscopy;  Laterality: N/A;    ESOPHAGOGASTRODUODENOSCOPY N/A 11/28/2014   Procedure: ESOPHAGOGASTRODUODENOSCOPY (EGD);  Surgeon: [name], MD;  Location: [facility] ENDOSCOPY;  Service: Endoscopy;  Laterality: N/A;   ESOPHAGOGASTRODUODENOSCOPY N/A 02/21/2016   Procedure: ESOPHAGOGASTRODUODENOSCOPY (EGD);  Surgeon: Mauri Pole, MD;  Location: [facility] ENDOSCOPY;  Service: Endoscopy;  Laterality: N/A;   ESOPHAGOGASTRODUODENOSCOPY N/A 04/21/2016   Procedure: ESOPHAGOGASTRODUODENOSCOPY (EGD);  Surgeon: Gatha Mayer, MD;  Location: [facility] ENDOSCOPY;  Service: Endoscopy;  Laterality: N/A;   ESOPHAGOGASTRODUODENOSCOPY N/A 05/06/2016   Procedure: ESOPHAGOGASTRODUODENOSCOPY (EGD);  Surgeon: Jonathon Bellows, MD;  Location: [facility] ENDOSCOPY;  Service: Gastroenterology;  Laterality: N/A;   ESOPHAGOGASTRODUODENOSCOPY N/A 06/13/2016   Procedure: ESOPHAGOGASTRODUODENOSCOPY (EGD);  Surgeon: [name], MD;  Location: [facility] ENDOSCOPY;  Service: Endoscopy;  Laterality: N/A;   ESOPHAGOGASTRODUODENOSCOPY (EGD) WITH PROPOFOL N/A 02/29/2016   Procedure: ESOPHAGOGASTRODUODENOSCOPY (EGD) WITH PROPOFOL;  Surgeon: [name], MD;  Location: [facility] ENDOSCOPY;  Service: Endoscopy;  Laterality: N/A;   ESOPHAGOGASTRODUODENOSCOPY (EGD) WITH PROPOFOL N/A 01/15/2018   Procedure: ESOPHAGOGASTRODUODENOSCOPY (EGD) WITH PROPOFOL;  Surgeon: Daneil Dolin, MD;  Location: [facility] ENDO SUITE;  Service: Endoscopy;  Laterality: N/A;  retrieval forein body   LAPAROTOMY N/A 09/12/2015   Procedure: EXPLORATORY LAPAROTOMY;  Surgeon: Florene Glen, MD;  Location: [facility] ORS;  Service: General;  Laterality: N/A;   SIGMOIDOSCOPY N/A 09/12/2015   Procedure: Lonell Face;  Surgeon: Florene Glen,  MD;  Location: [facility] ORS;  Service: General;  Laterality: N/A;   WISDOM TOOTH EXTRACTION      There were no vitals filed for this visit.   Subjective Assessment - 05/04/21 0931     Subjective Patient reports 1/10 pain at arrival - mild "achiness" that she associates with cold weather.  She reports compliance with HEP. Patient denies recent near-falls or safety concerns.    Pertinent History Patient is a 30 year old female with involved psych medical history with primary complaint of chronic bilateral knee pain with gait instability and Hx of frequent falls. Patient has had multiple conservative treatments for knee pain including anti-inflammatories, injections. Hx of R ACL sprain and post-injury rehab. She states that recently injections helped for about 3 weeks. Patient reports notable "aching" behind her kneecap with prolonged sitting (pre-patellar pain). Patient reports ongoing pain and dysfunction since 2020. Patient has frequent falls. Patient reports about 10 falls over the last 6 months. She reports direct impact onto her knees often with her falls. Patient had isolated incident of numbness due to notable swelling after fall, but has not felt numbness or paresthesias recently. When questioned about neuro history, patient reports "changes in white matter" and associated migraines (present since [age] years old, have remained stable since then). Pt lives with her fiance. Patient has 3 steps to get into her home and pt uses railing. Patient reports she has to negotiate grass on level ground, but she can negotiate this well. Patient reports falls can happen with prolonged standing, walking, and functional reaching while standing. She states her R knee intermittently "gives out." Pt has continuous aching in her knees.    Limitations Standing;Sitting;House hold activities;Walking    Diagnostic tests MRI and knee radiograph - see below    Patient Stated Goals Improved ability to walk, less falls, improved knee pain                 TREATMENT    Therapeutic Exercise - for improved soft tissue flexibility and extensibility as needed for ROM, improved strength as needed to improve performance of CKC activities/functional movements     NuStep x 5 minutes, Level 4; for knee AAROM  and warm-up for improved soft tissue mobility/extensibility and muscle performance - subjective information gathered during this time   Sit to stand with raised table; 2x8; with 6-lb [place] Goblet hold   Glute Bridge; 2x10, Grn band above knees    Seated knee extension; 2x10, 5 sec, bilateral, 7.5-lb ankle weights  Patient education: discussed wean from FWW and using walker only for longer-distance walking; discussed using SPC in home and for short-distance walking in community      *not today* Straight leg raise; 2x10 each LE, supine, with 3-lb ankle weights [tactile cueing for maintaining quadriceps set] Ambulate lap around gym x1 with FWW; CGA Quad set; 2x10, 5 sec, bilateral   Neuromuscular Re-education - for postural stability/postural control, gluteal musculature activation and exercises to promote LE kinetic chain stability, dynamic gait stability   Gait with SPC; no cues required for correct sequencing; around building, negotiating curb cut, sidewalk with incline/decline, and single step-up onto curb, x 8 minutes   In // bars: Hurdle step over 3 6-inch hurdles; 4x D/B Long Airex Tandem walk; 4x D/B, forward only      *next visit* 3 consecutive Airex pads on floor, step on/over each; 4x D/B         *not today* Toe tapping to two stacked 6-inch steps with stance on  Airex; 2x10, alternating Sidestepping; 4x D/B, no UE support with BlueTband around thighs superior to patellae  Toe tapping to 12-inch step (two steps stacked); 1x10 alternating [report of R knee pain toward end of set] [place] 0-30 deg; 2x10 Forward and retro stepping; 4x D/B, no UE support Standing march; 2x10 alt, no UE support Standing knee flexion, alternating; 1x15, standing in walker  Standing heel raise/toe raise; 20x alternating PF/DF Sidelying Clamshell; 1x10, on each side (for HEP review)          *not today* Cold pack (unbilled) - for anti-inflammatory and analgesic effect as needed for  reduced pain and improved ability to participate in active PT intervention, along bilateral in supine, x 5 minutes       ASSESSMENT Patient demonstrates safe negotiation of low-grade incline and decline, uneven ground, curb, and sidewalk with use of SPC; she had no LOB or episodes of buckling. Pt is appropriate for weaning to [facility] use versus front-wheel walker for home mobility and short walks outside of home; discussed using FWW for prolonged walking (e.g. shopping mall, department store, supermarket). She has no significant LOB with hurdle negotiation in clinic. She is challenged with Airex Tandem walk, but is able to recover balance independently as needed with unilateral support on parallel bar. She has relatively low level of pain and feels her conditioning is improving since outset of PT. 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 Patient will benefit from continued skilled therapeutic intervention to address the above deficits as needed for improved function and QoL.       PT Short Term Goals - 04/25/21 0829       PT SHORT TERM GOAL #1   Title Pt will be independent with HEP in order to decrease knee pain and increase strength in order to improve pain-free function at home and work.    Baseline 03/18/21: HEP to be initiated next visit.   04/22/21: Patient is compliant with home exercise program and verbalizes and demonstrates sound understanding of exercises.    Time 3    Period Weeks    Status Achieved    Target Date 04/08/21      PT SHORT TERM GOAL #2   Title Patient will perform sit to stand from standard chair without significant UE support and no increase in baseline pain indicative of improved functional LE strength as needed to prevent fall during episode of LOB and improved ability to transfer    Baseline 03/18/21:  Significant difficulty and heavy UE suppot with weight shift to LLE for transferring.   04/22/21: Moderate UE support required for sit to stand from standard height chair; she can perform sit to stand without UE support from raised surface.    Time 6    Period Weeks    Status Not Met    Target Date 05/06/21               PT Long Term Goals - 04/22/21 1011       PT LONG TERM GOAL #1   Title Patient will demonstrate improved function as evidenced by a score of 57 on FOTO measure for full participation in activities at home and in the community.    Baseline 03/18/21: 47.     04/22/21: 67    Time 10    Period Weeks    Status Achieved    Target Date 05/27/21  PT LONG TERM GOAL #2   Title Pt will increase strength of tested lower extremity musculature to at least 4+ MMT grade in order to demonstrate improvement in strength and function required for prolonged weightbearing activity, stair/step and obstacle negotiation    Baseline 03/18/21: Hip/knee musculature 3+ to 4- in RLE, grossly 4/5 in LLE.  04/22/21: Met for all LE muscle groups tested.    Time 10    Period Weeks    Status Achieved    Target Date 05/27/21      PT LONG TERM GOAL #3   Title Pt will decrease worst pain as reported on NPRS by at least 3 points in order to demonstrate clinically significant reduction in pain.    Baseline 03/18/21: Pain up to 10/10 at worst.    04/22/21: Pain up to 3/10 over the previous week.    Time 10    Period Weeks    Status Achieved    Target Date 05/27/21      PT LONG TERM GOAL #4   Title Patient will ambulate up to 150 feet in hallway with least-restrictive assistive device with no LOB or lower extremity buckling and significant increase in baseline pain by 2 points or greater on NPRS - indicative of improved household-level and short-distance mobility    Baseline 03/18/21: Significant limitation with prolonged standing activity, gait deficits and notable pain with ambulation c front-wheeled  walker.   04/22/21: Ambulated 80 feet with SPC only with mild decreased stance time RLE and decreased gait velocity.    Time 10    Period Weeks    Status Partially Met    Target Date 05/27/21      PT LONG TERM GOAL #5   Title Patient will improve BERG by 3 points or greater indicative of decreased risk of falling and improved function for performing functional mobility ADLs and transferring    Baseline 03/18/21: BERG to be performed on visit # 2.  03/23/21: BERG 41/56.  04/22/21: To be performed next visit.    Time 10    Period Weeks    Status Deferred    Target Date 05/27/21                   Plan - 05/05/21 1117     Clinical Impression Statement Patient demonstrates safe negotiation of low-grade incline and decline, uneven ground, curb, and sidewalk with use of SPC; she had no LOB or episodes of buckling. Pt is appropriate for weaning to [facility] use versus front-wheel walker for home mobility and short walks outside of home; discussed using FWW for prolonged walking (e.g. shopping mall, department store, supermarket). She has no significant LOB with hurdle negotiation in clinic. She is challenged with Airex Tandem walk, but is able to recover balance independently as needed with unilateral support on parallel bar. She has relatively low level of pain and feels her conditioning is improving since outset of PT. 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 Patient will benefit from continued skilled therapeutic intervention to address the above deficits as needed for improved function and QoL.    Personal Factors and Comorbidities Comorbidity 3+;Time since onset of injury/illness/exacerbation;Past/Current Experience;Behavior Pattern    Comorbidities Anxiety, depression, Hx of schizoaffective disorder, thyroid disease, obesity,  chronic bilateral knee pain, GERD    Examination-Activity Limitations Bed Mobility;Dressing;Stand;Stairs;Bend;Sit;Transfers;Lift;Locomotion Level;Squat    Examination-Participation Restrictions Cleaning  Stability/Clinical Decision Making Unstable/Unpredictable    Rehab Potential Fair    PT Frequency 2x / week    PT Duration Other (comment)   10 weeks   PT Treatment/Interventions Cryotherapy;Electrical Stimulation;ADLs/Self Care Home Management;Gait training;Stair training;Functional mobility training;Therapeutic activities;Therapeutic exercise;Balance training;Neuromuscular re-education;Patient/family education;Manual techniques;Passive range of motion    PT Next Visit Plan Continued with gait training with LRAD, low-impact lower extremity isotonics with increasing emphasis on closed-chain strengthening. Postural stability training and fall risk management.    PT Home Exercise Plan Access Code [email or web address]    Consulted and Agree with Plan of Care Patient             Patient will benefit from skilled therapeutic intervention in order to improve the following deficits and impairments:  Abnormal gait, Decreased activity tolerance, Decreased range of motion, Decreased strength, Hypomobility, Pain, Difficulty walking, Decreased balance  Visit Diagnosis: Chronic pain of both knees  Difficulty in walking, not elsewhere classified  Muscle weakness (generalized)  Personal history of fall     Problem List Patient Active Problem List   Diagnosis Date Noted   Migraine without aura and without status migrainosus, not intractable 03/05/2021   Mild intermittent asthma without complication 01/41/0301   Gastroesophageal reflux disease 12/01/2020   Hypoglycemia 12/01/2020   History of hypoglycemia 04/26/2020   History of hyperprolactinemia 04/26/2020   Rheumatoid factor positive 12/09/2019   SS-A antibody positive 10/28/2019   Chronic pain of both knees 10/28/2019   Subclinical  hypothyroidism 08/02/2019   Hyperprolactinemia ([place]) 08/02/2019   Schizoaffective disorder, depressive type ([place]) 02/02/2018   Suicide attempt ([place]) 01/16/2018   Dysfunctional uterine bleeding 01/06/2018   Borderline personality disorder ([place]) 01/04/2018   PTSD (post-traumatic stress disorder) 01/03/2018   ASCUS of cervix with negative high risk HPV 08/28/2017   Malingering 05/06/2016   Breast tumor 03/05/2016   Schizoaffective disorder, bipolar type ([place]) 11/06/2014   Hypertension 09/29/2014   Valentina Gu, PT, DPT #[id number]  Eilleen Kempf, PT 05/05/2021, 11:17 AM  [facility] [address] [place], [place], [address] Phone: [phone]   Fax:  [phone]  [name] MRN: [id number] Date of Birth: 29-Mar-1991

## 2021-05-05 ENCOUNTER — Encounter: Payer: Self-pay | Admitting: Physical Therapy

## 2021-05-05 LAB — TEST AUTHORIZATION

## 2021-05-05 LAB — HCG, SERUM, QUALITATIVE: Preg, Serum: NEGATIVE

## 2021-05-05 LAB — PROLACTIN: Prolactin: 62.7 ng/mL — ABNORMAL HIGH

## 2021-05-06 ENCOUNTER — Telehealth: Payer: Self-pay | Admitting: Internal Medicine

## 2021-05-06 ENCOUNTER — Other Ambulatory Visit: Payer: Self-pay

## 2021-05-06 ENCOUNTER — Ambulatory Visit: Payer: Medicare Other

## 2021-05-06 DIAGNOSIS — M25561 Pain in right knee: Secondary | ICD-10-CM | POA: Diagnosis not present

## 2021-05-06 DIAGNOSIS — M25562 Pain in left knee: Secondary | ICD-10-CM

## 2021-05-06 DIAGNOSIS — M6281 Muscle weakness (generalized): Secondary | ICD-10-CM

## 2021-05-06 DIAGNOSIS — Z9181 History of falling: Secondary | ICD-10-CM

## 2021-05-06 DIAGNOSIS — R262 Difficulty in walking, not elsewhere classified: Secondary | ICD-10-CM

## 2021-05-06 NOTE — Therapy (Signed)
[facility] [address] [place], [place], [address] Phone: [phone]   Fax:  [phone]  Physical Therapy Treatment  Patient Details  [name] MRN: [id number] Date of Birth: 04-Dec-1990 Referring Provider (PT): Rosalia Hammers, MD   Encounter Date: 05/06/2021   PT End of Session - 05/06/21 1322     Visit Number 13    Number of Visits 21    Date for PT Re-Evaluation 05/27/21    Authorization Type Medicare/Medicaid, VL based on medical necessity, no auth    Authorization Time Period Cert 92/42/68-3/41/96, prog note 04/22/21    Progress Note Due on Visit 19    PT Start Time 0945    PT Stop Time 1030    PT Time Calculation (min) 45 min    Equipment Utilized During Treatment Gait belt;Other (comment)   front-wheel walker (patient has her own walker), SPC   Activity Tolerance Patient tolerated treatment well    Behavior During Therapy [facility] for tasks assessed/performed             Past Medical History:  Diagnosis Date   Allergy    Anxiety    Arthritis    Asthma    Depression    Foreign body aspiration    GERD (gastroesophageal reflux disease)    Hallucinations 09/30/2014   Sizoaffective   Hyperlipidemia    Hypertension    Intentional ingestion of batteries 04/21/2016    2 AAA batteries and 1 thumb tack; intent to hurt herself/notes 22/06/9796   Self-inflicted injury 92/03/9416   Suicidal ideation 01/02/2018   Tardive dyskinesia 10/2014   recent onset   Thyroid disease     Past Surgical History:  Procedure Laterality Date   ABDOMINAL SURGERY     "years ago" to remove foreign objects   APPENDECTOMY     BREAST EXCISIONAL BIOPSY Right 09/03/2007   surgical bx removed fibroadeoma   BREAST LUMPECTOMY Right    COLONOSCOPY WITH PROPOFOL N/A 09/10/2015   Procedure: COLONOSCOPY WITH PROPOFOL;  Surgeon: [name], MD;  Location: [facility] ENDOSCOPY;  Service: Endoscopy;  Laterality: N/A;    ESOPHAGOGASTRODUODENOSCOPY N/A 11/28/2014   Procedure: ESOPHAGOGASTRODUODENOSCOPY (EGD);  Surgeon: [name], MD;  Location: [facility] ENDOSCOPY;  Service: Endoscopy;  Laterality: N/A;   ESOPHAGOGASTRODUODENOSCOPY N/A 02/21/2016   Procedure: ESOPHAGOGASTRODUODENOSCOPY (EGD);  Surgeon: Mauri Pole, MD;  Location: [facility] ENDOSCOPY;  Service: Endoscopy;  Laterality: N/A;   ESOPHAGOGASTRODUODENOSCOPY N/A 04/21/2016   Procedure: ESOPHAGOGASTRODUODENOSCOPY (EGD);  Surgeon: Gatha Mayer, MD;  Location: [facility] ENDOSCOPY;  Service: Endoscopy;  Laterality: N/A;   ESOPHAGOGASTRODUODENOSCOPY N/A 05/06/2016   Procedure: ESOPHAGOGASTRODUODENOSCOPY (EGD);  Surgeon: Jonathon Bellows, MD;  Location: [facility] ENDOSCOPY;  Service: Gastroenterology;  Laterality: N/A;   ESOPHAGOGASTRODUODENOSCOPY N/A 06/13/2016   Procedure: ESOPHAGOGASTRODUODENOSCOPY (EGD);  Surgeon: [name], MD;  Location: [facility] ENDOSCOPY;  Service: Endoscopy;  Laterality: N/A;   ESOPHAGOGASTRODUODENOSCOPY (EGD) WITH PROPOFOL N/A 02/29/2016   Procedure: ESOPHAGOGASTRODUODENOSCOPY (EGD) WITH PROPOFOL;  Surgeon: [name], MD;  Location: [facility] ENDOSCOPY;  Service: Endoscopy;  Laterality: N/A;   ESOPHAGOGASTRODUODENOSCOPY (EGD) WITH PROPOFOL N/A 01/15/2018   Procedure: ESOPHAGOGASTRODUODENOSCOPY (EGD) WITH PROPOFOL;  Surgeon: Daneil Dolin, MD;  Location: [facility] ENDO SUITE;  Service: Endoscopy;  Laterality: N/A;  retrieval forein body   LAPAROTOMY N/A 09/12/2015   Procedure: EXPLORATORY LAPAROTOMY;  Surgeon: Florene Glen, MD;  Location: [facility] ORS;  Service: General;  Laterality: N/A;   SIGMOIDOSCOPY N/A 09/12/2015   Procedure: Lonell Face;  Surgeon: Florene Glen,  MD;  Location: [facility] ORS;  Service: General;  Laterality: N/A;   WISDOM TOOTH EXTRACTION      There were no vitals filed for this visit.   Subjective Assessment - 05/06/21 1320     Subjective Pt reports she feels like PT is helping her knee pain and she is feeling stronger in her knees  too.  She brought a [facility] with her to PT appointment and has tried using it for ambulating short distances.    Pertinent History Patient is a 30 year old female with involved psych medical history with primary complaint of chronic bilateral knee pain with gait instability and Hx of frequent falls. Patient has had multiple conservative treatments for knee pain including anti-inflammatories, injections. Hx of R ACL sprain and post-injury rehab. She states that recently injections helped for about 3 weeks. Patient reports notable "aching" behind her kneecap with prolonged sitting (pre-patellar pain). Patient reports ongoing pain and dysfunction since 2020. Patient has frequent falls. Patient reports about 10 falls over the last 6 months. She reports direct impact onto her knees often with her falls. Patient had isolated incident of numbness due to notable swelling after fall, but has not felt numbness or paresthesias recently. When questioned about neuro history, patient reports "changes in white matter" and associated migraines (present since [age] years old, have remained stable since then). Pt lives with her fiance. Patient has 3 steps to get into her home and pt uses railing. Patient reports she has to negotiate grass on level ground, but she can negotiate this well. Patient reports falls can happen with prolonged standing, walking, and functional reaching while standing. She states her R knee intermittently "gives out." Pt has continuous aching in her knees.    Limitations Standing;Sitting;House hold activities;Walking    Diagnostic tests MRI and knee radiograph - see below    Patient Stated Goals Improved ability to walk, less falls, improved knee pain              TREATMENT     Therapeutic Exercise - for improved soft tissue flexibility and extensibility as needed for ROM, improved strength as needed to improve performance of CKC activities/functional movements     NuStep x 5 minutes, Level 4;  for knee AAROM and warm-up for improved soft tissue mobility/extensibility and muscle performance - subjective information gathered during this time   Sit to stand with raised table; 2x8; with 6-lb [place] Goblet hold   Glute Bridge; 2x10, Grn band above knees    Seated knee extension; 2x10, 5 sec, bilateral, 7.5-lb ankle weights   Patient education: PT adjusted SPC height for pt today as she brought her own with her today  Straight leg raise; 2x10 each LE, supine, with 3-lb ankle weights [tactile cueing for maintaining quadriceps set]    *not today* Ambulate lap around gym x1 with FWW; CGA Quad set; 2x10, 5 sec, bilateral   Neuromuscular Re-education - for postural stability/postural control, gluteal musculature activation and exercises to promote LE kinetic chain stability, dynamic gait stability   Gait with SPC; on flat surface in clinic after SPC adjusted x 3 min   In // bars: Hurdle step over 3 6-inch hurdles; 4x D/B Long Airex Tandem walk; 4x D/B, forward only      *next visit* 3 consecutive Airex pads on floor, step on/over each; 4x D/B         *not today* Toe tapping to two stacked 6-inch steps with stance on Airex; 2x10, alternating Sidestepping; 4x D/B, no UE  support with BlueTband around thighs superior to patellae  Toe tapping to 12-inch step (two steps stacked); 1x10 alternating [report of R knee pain toward end of set] Minisquat 0-30 deg; 2x10 Forward and retro stepping; 4x D/B, no UE support Standing march; 2x10 alt, no UE support Standing knee flexion, alternating; 1x15, standing in walker  Standing heel raise/toe raise; 20x alternating PF/DF Sidelying Clamshell; 1x10, on each side (for HEP review)     *not today* Cold pack (unbilled) - for anti-inflammatory and analgesic effect as needed for reduced pain and improved ability to participate in active PT intervention, along bilateral in supine, x 5 minutes     ASSESSMENT Pt brought a SPC with her today;  PT adjusted height for pt to promote optimal UE/LE body mechanics during ambulation.  She demonstates ability to amb on flat surfaces in clinic using the [facility] with proper UE/LE sequencing and SPC placement.  Overall she tolerated therapeutic exercises well and noted muscle fatigue but no onset of knee pain by end of session.       PT Short Term Goals - 04/25/21 0829       PT SHORT TERM GOAL #1   Title Pt will be independent with HEP in order to decrease knee pain and increase strength in order to improve pain-free function at home and work.    Baseline 03/18/21: HEP to be initiated next visit.   04/22/21: Patient is compliant with home exercise program and verbalizes and demonstrates sound understanding of exercises.    Time 3    Period Weeks    Status Achieved    Target Date 04/08/21      PT SHORT TERM GOAL #2   Title Patient will perform sit to stand from standard chair without significant UE support and no increase in baseline pain indicative of improved functional LE strength as needed to prevent fall during episode of LOB and improved ability to transfer    Baseline 03/18/21: Significant difficulty and heavy UE suppot with weight shift to LLE for transferring.   04/22/21: Moderate UE support required for sit to stand from standard height chair; she can perform sit to stand without UE support from raised surface.    Time 6    Period Weeks    Status Not Met    Target Date 05/06/21               PT Long Term Goals - 04/22/21 1011       PT LONG TERM GOAL #1   Title Patient will demonstrate improved function as evidenced by a score of 57 on FOTO measure for full participation in activities at home and in the community.    Baseline 03/18/21: 47.     04/22/21: 67    Time 10    Period Weeks    Status Achieved    Target Date 05/27/21      PT LONG TERM GOAL #2   Title Pt will increase strength of tested lower extremity musculature to at least 4+ MMT grade in order to demonstrate  improvement in strength and function required for prolonged weightbearing activity, stair/step and obstacle negotiation    Baseline 03/18/21: Hip/knee musculature 3+ to 4- in RLE, grossly 4/5 in LLE.  04/22/21: Met for all LE muscle groups tested.    Time 10    Period Weeks    Status Achieved    Target Date 05/27/21      PT LONG TERM GOAL #3   Title Pt will  decrease worst pain as reported on NPRS by at least 3 points in order to demonstrate clinically significant reduction in pain.    Baseline 03/18/21: Pain up to 10/10 at worst.    04/22/21: Pain up to 3/10 over the previous week.    Time 10    Period Weeks    Status Achieved    Target Date 05/27/21      PT LONG TERM GOAL #4   Title Patient will ambulate up to 150 feet in hallway with least-restrictive assistive device with no LOB or lower extremity buckling and significant increase in baseline pain by 2 points or greater on NPRS - indicative of improved household-level and short-distance mobility    Baseline 03/18/21: Significant limitation with prolonged standing activity, gait deficits and notable pain with ambulation c front-wheeled walker.   04/22/21: Ambulated 80 feet with SPC only with mild decreased stance time RLE and decreased gait velocity.    Time 10    Period Weeks    Status Partially Met    Target Date 05/27/21      PT LONG TERM GOAL #5   Title Patient will improve BERG by 3 points or greater indicative of decreased risk of falling and improved function for performing functional mobility ADLs and transferring    Baseline 03/18/21: BERG to be performed on visit # 2.  03/23/21: BERG 41/56.  04/22/21: To be performed next visit.    Time 10    Period Weeks    Status Deferred    Target Date 05/27/21                   Plan - 05/06/21 1322     Clinical Impression Statement Pt brought a SPC with her today; PT adjusted height for pt to promote optimal UE/LE body mechanics during ambulation.  She demonstates ability to amb on  flat surfaces in clinic using the [facility] with proper UE/LE sequencing and SPC placement.  Overall she tolerated therapeutic exercises well and noted muscle fatigue but no onset of knee pain by end of session.    Personal Factors and Comorbidities Comorbidity 3+;Time since onset of injury/illness/exacerbation;Past/Current Experience;Behavior Pattern    Comorbidities Anxiety, depression, Hx of schizoaffective disorder, thyroid disease, obesity, chronic bilateral knee pain, GERD    Examination-Activity Limitations Bed Mobility;Dressing;Stand;Stairs;Bend;Sit;Transfers;Lift;Locomotion Level;Squat    Examination-Participation Restrictions Cleaning    Stability/Clinical Decision Making Unstable/Unpredictable    Rehab Potential Fair    PT Frequency 2x / week    PT Duration Other (comment)   10 weeks   PT Treatment/Interventions Cryotherapy;Electrical Stimulation;ADLs/Self Care Home Management;Gait training;Stair training;Functional mobility training;Therapeutic activities;Therapeutic exercise;Balance training;Neuromuscular re-education;Patient/family education;Manual techniques;Passive range of motion    PT Next Visit Plan Continued with gait training with LRAD, low-impact lower extremity isotonics with increasing emphasis on closed-chain strengthening. Postural stability training and fall risk management.    PT Home Exercise Plan Access Code [email or web address]    Consulted and Agree with Plan of Care Patient             Patient will benefit from skilled therapeutic intervention in order to improve the following deficits and impairments:  Abnormal gait, Decreased activity tolerance, Decreased range of motion, Decreased strength, Hypomobility, Pain, Difficulty walking, Decreased balance  Visit Diagnosis: Chronic pain of both knees  Difficulty in walking, not elsewhere classified  Muscle weakness (generalized)  Personal history of fall     Problem List Patient Active Problem List   Diagnosis Date Noted    Migraine without aura and  without status migrainosus, not intractable 03/05/2021   Mild intermittent asthma without complication 14/23/2009   Gastroesophageal reflux disease 12/01/2020   Hypoglycemia 12/01/2020   History of hypoglycemia 04/26/2020   History of hyperprolactinemia 04/26/2020   Rheumatoid factor positive 12/09/2019   SS-A antibody positive 10/28/2019   Chronic pain of both knees 10/28/2019   Subclinical hypothyroidism 08/02/2019   Hyperprolactinemia ([place]) 08/02/2019   Schizoaffective disorder, depressive type ([place]) 02/02/2018   Suicide attempt ([place]) 01/16/2018   Dysfunctional uterine bleeding 01/06/2018   Borderline personality disorder ([place]) 01/04/2018   PTSD (post-traumatic stress disorder) 01/03/2018   ASCUS of cervix with negative high risk HPV 08/28/2017   Malingering 05/06/2016   Breast tumor 03/05/2016   Schizoaffective disorder, bipolar type ([place]) 11/06/2014   Hypertension 09/29/2014    Pincus Badder, PT 05/06/2021, 1:26 PM Merdis Delay, PT, DPT  [phone]  [facility] [address] [place], [place], [address] Phone: [phone]   Fax:  [phone]  Name: Savera Donson MRN: [id number] Date of Birth: June 27, 1990

## 2021-05-06 NOTE — Telephone Encounter (Signed)
Left a message for the patient to call me back to discuss her elevated prolactin level on 05/06/2021 at 11:55 AM   Awaiting callback

## 2021-05-07 ENCOUNTER — Telehealth: Payer: Self-pay

## 2021-05-07 ENCOUNTER — Encounter: Payer: Self-pay | Admitting: Internal Medicine

## 2021-05-07 MED ORDER — CABERGOLINE 0.5 MG PO TABS: 0.5000 mg | ORAL_TABLET | ORAL | 1 refills | Status: DC

## 2021-05-07 NOTE — Telephone Encounter (Signed)
I have called the pt back on 05/07/2021 at 10 AM but  the call went to voice mail   I am going to send her a portal message with starting cabergoline 1 tablet WEEKLY    Pt with be advised to AVOID pregnancy at all cost.     Prolactin continues to trend up    [place], MD  [facility] Endocrinology  [facility] [address] [place], [place] [address] Phone: [phone] FAX: [phone]

## 2021-05-07 NOTE — Telephone Encounter (Signed)
Patient returned your call about prolactin levels.

## 2021-05-11 ENCOUNTER — Other Ambulatory Visit: Payer: Self-pay

## 2021-05-11 ENCOUNTER — Ambulatory Visit: Payer: Medicare Other | Admitting: Physical Therapy

## 2021-05-11 ENCOUNTER — Encounter: Payer: Self-pay | Admitting: Physical Therapy

## 2021-05-11 DIAGNOSIS — M25562 Pain in left knee: Secondary | ICD-10-CM

## 2021-05-11 DIAGNOSIS — M6281 Muscle weakness (generalized): Secondary | ICD-10-CM

## 2021-05-11 DIAGNOSIS — R262 Difficulty in walking, not elsewhere classified: Secondary | ICD-10-CM

## 2021-05-11 DIAGNOSIS — Z9181 History of falling: Secondary | ICD-10-CM

## 2021-05-11 DIAGNOSIS — M25561 Pain in right knee: Secondary | ICD-10-CM | POA: Diagnosis not present

## 2021-05-11 NOTE — Therapy (Signed)
[facility] [address]. [place], [place], [address] Phone: [phone]   Fax:  [phone]  Physical Therapy Treatment  Patient Details  [name] MRN: [id number] Date of Birth: [birth year] Referring Provider (PT): Rosalia Hammers, MD   Encounter Date: 05/11/2021   PT End of Session - 05/11/21 0943     Visit Number 14    Number of Visits 21    Date for PT Re-Evaluation 05/27/21    Authorization Type Medicare/Medicaid, VL based on medical necessity, no auth    Authorization Time Period Cert 03/17/10-1/73/56, prog note 04/22/21    Progress Note Due on Visit 19    PT Start Time 0938    PT Stop Time 1028    PT Time Calculation (min) 50 min    Equipment Utilized During Treatment Gait belt;Other (comment)   front-wheel walker (patient has her own walker), SPC   Activity Tolerance Patient tolerated treatment well    Behavior During Therapy [facility] for tasks assessed/performed             Past Medical History:  Diagnosis Date   Allergy    Anxiety    Arthritis    Asthma    Depression    Foreign body aspiration    GERD (gastroesophageal reflux disease)    Hallucinations 09/30/2014   Sizoaffective   Hyperlipidemia    Hypertension    Intentional ingestion of batteries 04/21/2016    2 AAA batteries and 1 thumb tack; intent to hurt herself/notes 70/05/4101   Self-inflicted injury 06/15/4386   Suicidal ideation 01/02/2018   Tardive dyskinesia 10/2014   recent onset   Thyroid disease     Past Surgical History:  Procedure Laterality Date   ABDOMINAL SURGERY     "years ago" to remove foreign objects   APPENDECTOMY     BREAST EXCISIONAL BIOPSY Right 09/03/2007   surgical bx removed fibroadeoma   BREAST LUMPECTOMY Right    COLONOSCOPY WITH PROPOFOL N/A 09/10/2015   Procedure: COLONOSCOPY WITH PROPOFOL;  Surgeon: [name], MD;  Location: [facility] ENDOSCOPY;  Service: Endoscopy;  Laterality: N/A;    ESOPHAGOGASTRODUODENOSCOPY N/A 11/28/2014   Procedure: ESOPHAGOGASTRODUODENOSCOPY (EGD);  Surgeon: [name], MD;  Location: [facility] ENDOSCOPY;  Service: Endoscopy;  Laterality: N/A;   ESOPHAGOGASTRODUODENOSCOPY N/A 02/21/2016   Procedure: ESOPHAGOGASTRODUODENOSCOPY (EGD);  Surgeon: Mauri Pole, MD;  Location: [facility] ENDOSCOPY;  Service: Endoscopy;  Laterality: N/A;   ESOPHAGOGASTRODUODENOSCOPY N/A 04/21/2016   Procedure: ESOPHAGOGASTRODUODENOSCOPY (EGD);  Surgeon: Gatha Mayer, MD;  Location: [facility] ENDOSCOPY;  Service: Endoscopy;  Laterality: N/A;   ESOPHAGOGASTRODUODENOSCOPY N/A 05/06/2016   Procedure: ESOPHAGOGASTRODUODENOSCOPY (EGD);  Surgeon: Jonathon Bellows, MD;  Location: [facility] ENDOSCOPY;  Service: Gastroenterology;  Laterality: N/A;   ESOPHAGOGASTRODUODENOSCOPY N/A 06/13/2016   Procedure: ESOPHAGOGASTRODUODENOSCOPY (EGD);  Surgeon: [name], MD;  Location: [facility] ENDOSCOPY;  Service: Endoscopy;  Laterality: N/A;   ESOPHAGOGASTRODUODENOSCOPY (EGD) WITH PROPOFOL N/A 02/29/2016   Procedure: ESOPHAGOGASTRODUODENOSCOPY (EGD) WITH PROPOFOL;  Surgeon: [name], MD;  Location: [facility] ENDOSCOPY;  Service: Endoscopy;  Laterality: N/A;   ESOPHAGOGASTRODUODENOSCOPY (EGD) WITH PROPOFOL N/A 01/15/2018   Procedure: ESOPHAGOGASTRODUODENOSCOPY (EGD) WITH PROPOFOL;  Surgeon: Daneil Dolin, MD;  Location: [facility] ENDO SUITE;  Service: Endoscopy;  Laterality: N/A;  retrieval forein body   LAPAROTOMY N/A 09/12/2015   Procedure: EXPLORATORY LAPAROTOMY;  Surgeon: Florene Glen, MD;  Location: [facility] ORS;  Service: General;  Laterality: N/A;   SIGMOIDOSCOPY N/A 09/12/2015   Procedure: Lonell Face;  Surgeon: Florene Glen,  MD;  Location: [facility] ORS;  Service: General;  Laterality: N/A;   WISDOM TOOTH EXTRACTION      There were no vitals filed for this visit.   Subjective Assessment - 05/11/21 0941     Subjective Patient has updated Rx for prolactin. Patient does not have other major changes to report at  arrival. Patient reports feeling fairly well at arrival to PT. She reports mild aching associated with cold weather, 1/10 today - pt describes as dull achy pain. Patient reports tolerating last session well.    Pertinent History Patient is a 30 year old female with involved psych medical history with primary complaint of chronic bilateral knee pain with gait instability and Hx of frequent falls. Patient has had multiple conservative treatments for knee pain including anti-inflammatories, injections. Hx of R ACL sprain and post-injury rehab. She states that recently injections helped for about 3 weeks. Patient reports notable "aching" behind her kneecap with prolonged sitting (pre-patellar pain). Patient reports ongoing pain and dysfunction since 2020. Patient has frequent falls. Patient reports about 10 falls over the last 6 months. She reports direct impact onto her knees often with her falls. Patient had isolated incident of numbness due to notable swelling after fall, but has not felt numbness or paresthesias recently. When questioned about neuro history, patient reports "changes in white matter" and associated migraines (present since [age] years old, have remained stable since then). Pt lives with her fiance. Patient has 3 steps to get into her home and pt uses railing. Patient reports she has to negotiate grass on level ground, but she can negotiate this well. Patient reports falls can happen with prolonged standing, walking, and functional reaching while standing. She states her R knee intermittently "gives out." Pt has continuous aching in her knees.    Limitations Standing;Sitting;House hold activities;Walking    Diagnostic tests MRI and knee radiograph - see below    Patient Stated Goals Improved ability to walk, less falls, improved knee pain                 TREATMENT     Therapeutic Exercise - for improved soft tissue flexibility and extensibility as needed for ROM, improved strength as  needed to improve performance of CKC activities/functional movements     NuStep x 5 minutes, Level 4; for knee AAROM and warm-up for improved soft tissue mobility/extensibility and muscle performance - subjective information gathered during this time   Sit to stand with raised table; 2x8; with 8-lb [place] hold   Glute Bridge; 2x10, Blue band above knees    Seated knee extension; 2x10, 5 sec, bilateral, 7.5-lb ankle weights   Straight leg raise; 2x10 each LE, supine, with 4-lb ankle weights     *not today* Ambulate lap around gym x1 with FWW; CGA Quad set; 2x10, 5 sec, bilateral    Neuromuscular Re-education - for postural stability/postural control, gluteal musculature activation and exercises to promote LE kinetic chain stability, dynamic gait stability   Gait with SPC; around perimeter of building - including gait over sidewalk, curb cut, grass, decline, curb step up and down x 6 min   In // bars: Long Airex Tandem walk; 4x D/B, forward only 3 consecutive Airex pads on floor, step on/over each; 4x D/B       *not today* Hurdle step over 3 6-inch hurdles; 4x D/B Toe tapping to two stacked 6-inch steps with stance on Airex; 2x10, alternating Sidestepping; 4x D/B, no UE support with BlueTband around thighs superior to patellae  Toe tapping to 12-inch step (two steps stacked); 1x10 alternating [report of R knee pain toward end of set] [place] 0-30 deg; 2x10 Forward and retro stepping; 4x D/B, no UE support Standing march; 2x10 alt, no UE support Standing knee flexion, alternating; 1x15, standing in walker  Standing heel raise/toe raise; 20x alternating PF/DF Sidelying Clamshell; 1x10, on each side (for HEP review)     *not today* Cold pack (unbilled) - for anti-inflammatory and analgesic effect as needed for reduced pain and improved ability to participate in active PT intervention, along bilateral in supine, x 5 minutes       ASSESSMENT Patient exhibits safe  negotiation of uneven ground, incline/decline, and change in surface elevation today with SPC with no buckling or LOB. She is able to continue progressing of OKC and CKC progressive loading and quadriceps strengthening without notable complaint of pain. Patient is moderately challenged with Airex Tandem walk today with intermittent unilateral UE support needed to regain balance without assist needed from therapist. Patient is making excellent progress and will benefit from continued skilled PT per POC.         PT Short Term Goals - 04/25/21 0829       PT SHORT TERM GOAL #1   Title Pt will be independent with HEP in order to decrease knee pain and increase strength in order to improve pain-free function at home and work.    Baseline 03/18/21: HEP to be initiated next visit.   04/22/21: Patient is compliant with home exercise program and verbalizes and demonstrates sound understanding of exercises.    Time 3    Period Weeks    Status Achieved    Target Date 04/08/21      PT SHORT TERM GOAL #2   Title Patient will perform sit to stand from standard chair without significant UE support and no increase in baseline pain indicative of improved functional LE strength as needed to prevent fall during episode of LOB and improved ability to transfer    Baseline 03/18/21: Significant difficulty and heavy UE suppot with weight shift to LLE for transferring.   04/22/21: Moderate UE support required for sit to stand from standard height chair; she can perform sit to stand without UE support from raised surface.    Time 6    Period Weeks    Status Not Met    Target Date 05/06/21               PT Long Term Goals - 04/22/21 1011       PT LONG TERM GOAL #1   Title Patient will demonstrate improved function as evidenced by a score of 57 on FOTO measure for full participation in activities at home and in the community.    Baseline 03/18/21: 47.     04/22/21: 67    Time 10    Period Weeks    Status  Achieved    Target Date 05/27/21      PT LONG TERM GOAL #2   Title Pt will increase strength of tested lower extremity musculature to at least 4+ MMT grade in order to demonstrate improvement in strength and function required for prolonged weightbearing activity, stair/step and obstacle negotiation    Baseline 03/18/21: Hip/knee musculature 3+ to 4- in RLE, grossly 4/5 in LLE.  04/22/21: Met for all LE muscle groups tested.    Time 10    Period Weeks    Status Achieved    Target Date 05/27/21  PT LONG TERM GOAL #3   Title Pt will decrease worst pain as reported on NPRS by at least 3 points in order to demonstrate clinically significant reduction in pain.    Baseline 03/18/21: Pain up to 10/10 at worst.    04/22/21: Pain up to 3/10 over the previous week.    Time 10    Period Weeks    Status Achieved    Target Date 05/27/21      PT LONG TERM GOAL #4   Title Patient will ambulate up to 150 feet in hallway with least-restrictive assistive device with no LOB or lower extremity buckling and significant increase in baseline pain by 2 points or greater on NPRS - indicative of improved household-level and short-distance mobility    Baseline 03/18/21: Significant limitation with prolonged standing activity, gait deficits and notable pain with ambulation c front-wheeled walker.   04/22/21: Ambulated 80 feet with SPC only with mild decreased stance time RLE and decreased gait velocity.    Time 10    Period Weeks    Status Partially Met    Target Date 05/27/21      PT LONG TERM GOAL #5   Title Patient will improve BERG by 3 points or greater indicative of decreased risk of falling and improved function for performing functional mobility ADLs and transferring    Baseline 03/18/21: BERG to be performed on visit # 2.  03/23/21: BERG 41/56.  04/22/21: To be performed next visit.    Time 10    Period Weeks    Status Deferred    Target Date 05/27/21                   Plan - 05/11/21 1221      Clinical Impression Statement Patient exhibits safe negotiation of uneven ground, incline/decline, and change in surface elevation today with SPC with no buckling or LOB. She is able to continue progressing of OKC and CKC progressive loading and quadriceps strengthening without notable complaint of pain. Patient is moderately challenged with Airex Tandem walk today with intermittent unilateral UE support needed to regain balance without assist needed from therapist. Patient is making excellent progress and will benefit from continued skilled PT per POC.    Personal Factors and Comorbidities Comorbidity 3+;Time since onset of injury/illness/exacerbation;Past/Current Experience;Behavior Pattern    Comorbidities Anxiety, depression, Hx of schizoaffective disorder, thyroid disease, obesity, chronic bilateral knee pain, GERD    Examination-Activity Limitations Bed Mobility;Dressing;Stand;Stairs;Bend;Sit;Transfers;Lift;Locomotion Level;Squat    Examination-Participation Restrictions Cleaning    Stability/Clinical Decision Making Unstable/Unpredictable    Rehab Potential Fair    PT Frequency 2x / week    PT Duration Other (comment)   10 weeks   PT Treatment/Interventions Cryotherapy;Electrical Stimulation;ADLs/Self Care Home Management;Gait training;Stair training;Functional mobility training;Therapeutic activities;Therapeutic exercise;Balance training;Neuromuscular re-education;Patient/family education;Manual techniques;Passive range of motion    PT Next Visit Plan Continued with gait training with LRAD, low-impact lower extremity isotonics with increasing emphasis on closed-chain strengthening. Postural stability training and fall risk management.    PT Home Exercise Plan Access Code [email or web address]    Consulted and Agree with Plan of Care Patient             Patient will benefit from skilled therapeutic intervention in order to improve the following deficits and impairments:  Abnormal gait, Decreased  activity tolerance, Decreased range of motion, Decreased strength, Hypomobility, Pain, Difficulty walking, Decreased balance  Visit Diagnosis: Chronic pain of both knees  Difficulty in walking, not elsewhere classified  Muscle weakness (generalized)  Personal history of fall     Problem List Patient Active Problem List   Diagnosis Date Noted   Migraine without aura and without status migrainosus, not intractable 03/05/2021   Mild intermittent asthma without complication 71/10/2692   Gastroesophageal reflux disease 12/01/2020   Hypoglycemia 12/01/2020   History of hypoglycemia 04/26/2020   History of hyperprolactinemia 04/26/2020   Rheumatoid factor positive 12/09/2019   SS-A antibody positive 10/28/2019   Chronic pain of both knees 10/28/2019   Subclinical hypothyroidism 08/02/2019   Hyperprolactinemia ([place]) 08/02/2019   Schizoaffective disorder, depressive type ([place]) 02/02/2018   Suicide attempt ([place]) 01/16/2018   Dysfunctional uterine bleeding 01/06/2018   Borderline personality disorder ([place]) 01/04/2018   PTSD (post-traumatic stress disorder) 01/03/2018   ASCUS of cervix with negative high risk HPV 08/28/2017   Malingering 05/06/2016   Breast tumor 03/05/2016   Schizoaffective disorder, bipolar type ([place]) 11/06/2014   Hypertension 09/29/2014   Valentina Gu, PT, DPT #[id number]  Eilleen Kempf, PT 05/11/2021, 12:22 PM  [facility] [place] [facility] [address]. [place], [place], [address] Phone: [phone]   Fax:  [phone]  [name] MRN: [id number] Date of Birth: 1990/11/09

## 2021-05-13 ENCOUNTER — Other Ambulatory Visit: Payer: Self-pay

## 2021-05-13 ENCOUNTER — Ambulatory Visit: Payer: Medicare Other | Admitting: Physical Therapy

## 2021-05-13 ENCOUNTER — Encounter: Payer: Self-pay | Admitting: Physical Therapy

## 2021-05-13 DIAGNOSIS — M6281 Muscle weakness (generalized): Secondary | ICD-10-CM

## 2021-05-13 DIAGNOSIS — M25561 Pain in right knee: Secondary | ICD-10-CM | POA: Diagnosis not present

## 2021-05-13 DIAGNOSIS — R262 Difficulty in walking, not elsewhere classified: Secondary | ICD-10-CM

## 2021-05-13 DIAGNOSIS — M25562 Pain in left knee: Secondary | ICD-10-CM

## 2021-05-13 DIAGNOSIS — Z9181 History of falling: Secondary | ICD-10-CM

## 2021-05-13 NOTE — Therapy (Signed)
[facility] [address] [place], [place], [address] Phone: [phone]   Fax:  [phone]  Physical Therapy Treatment  Patient Details  [name] MRN: [id number] Date of Birth: [birth year] Referring Provider (PT): Rosalia Hammers, MD   Encounter Date: 05/13/2021   PT End of Session - 05/13/21 1302     Visit Number 15    Number of Visits 21    Date for PT Re-Evaluation 05/27/21    Authorization Type Medicare/Medicaid, VL based on medical necessity, no auth    Authorization Time Period Cert 82/42/35-3/61/44, prog note 04/22/21    Progress Note Due on Visit 19    PT Start Time 0945    PT Stop Time 1030    PT Time Calculation (min) 45 min    Equipment Utilized During Treatment Gait belt;Other (comment)   front-wheel walker (patient has her own walker), SPC   Activity Tolerance Patient tolerated treatment well    Behavior During Therapy [facility] for tasks assessed/performed             Past Medical History:  Diagnosis Date   Allergy    Anxiety    Arthritis    Asthma    Depression    Foreign body aspiration    GERD (gastroesophageal reflux disease)    Hallucinations 09/30/2014   Sizoaffective   Hyperlipidemia    Hypertension    Intentional ingestion of batteries 04/21/2016    2 AAA batteries and 1 thumb tack; intent to hurt herself/notes 31/09/4006   Self-inflicted injury 67/61/9509   Suicidal ideation 01/02/2018   Tardive dyskinesia 10/2014   recent onset   Thyroid disease     Past Surgical History:  Procedure Laterality Date   ABDOMINAL SURGERY     "years ago" to remove foreign objects   APPENDECTOMY     BREAST EXCISIONAL BIOPSY Right 09/03/2007   surgical bx removed fibroadeoma   BREAST LUMPECTOMY Right    COLONOSCOPY WITH PROPOFOL N/A 09/10/2015   Procedure: COLONOSCOPY WITH PROPOFOL;  Surgeon: [name], MD;  Location: [facility] ENDOSCOPY;  Service: Endoscopy;  Laterality: N/A;    ESOPHAGOGASTRODUODENOSCOPY N/A 11/28/2014   Procedure: ESOPHAGOGASTRODUODENOSCOPY (EGD);  Surgeon: [name], MD;  Location: [facility] ENDOSCOPY;  Service: Endoscopy;  Laterality: N/A;   ESOPHAGOGASTRODUODENOSCOPY N/A 02/21/2016   Procedure: ESOPHAGOGASTRODUODENOSCOPY (EGD);  Surgeon: Mauri Pole, MD;  Location: [facility] ENDOSCOPY;  Service: Endoscopy;  Laterality: N/A;   ESOPHAGOGASTRODUODENOSCOPY N/A 04/21/2016   Procedure: ESOPHAGOGASTRODUODENOSCOPY (EGD);  Surgeon: Gatha Mayer, MD;  Location: [facility] ENDOSCOPY;  Service: Endoscopy;  Laterality: N/A;   ESOPHAGOGASTRODUODENOSCOPY N/A 05/06/2016   Procedure: ESOPHAGOGASTRODUODENOSCOPY (EGD);  Surgeon: Jonathon Bellows, MD;  Location: [facility] ENDOSCOPY;  Service: Gastroenterology;  Laterality: N/A;   ESOPHAGOGASTRODUODENOSCOPY N/A 06/13/2016   Procedure: ESOPHAGOGASTRODUODENOSCOPY (EGD);  Surgeon: [name], MD;  Location: [facility] ENDOSCOPY;  Service: Endoscopy;  Laterality: N/A;   ESOPHAGOGASTRODUODENOSCOPY (EGD) WITH PROPOFOL N/A 02/29/2016   Procedure: ESOPHAGOGASTRODUODENOSCOPY (EGD) WITH PROPOFOL;  Surgeon: [name], MD;  Location: [facility] ENDOSCOPY;  Service: Endoscopy;  Laterality: N/A;   ESOPHAGOGASTRODUODENOSCOPY (EGD) WITH PROPOFOL N/A 01/15/2018   Procedure: ESOPHAGOGASTRODUODENOSCOPY (EGD) WITH PROPOFOL;  Surgeon: Daneil Dolin, MD;  Location: [facility] ENDO SUITE;  Service: Endoscopy;  Laterality: N/A;  retrieval forein body   LAPAROTOMY N/A 09/12/2015   Procedure: EXPLORATORY LAPAROTOMY;  Surgeon: Florene Glen, MD;  Location: [facility] ORS;  Service: General;  Laterality: N/A;   SIGMOIDOSCOPY N/A 09/12/2015   Procedure: Lonell Face;  Surgeon: Florene Glen,  MD;  Location: [facility] ORS;  Service: General;  Laterality: N/A;   WISDOM TOOTH EXTRACTION      There were no vitals filed for this visit.   Subjective Assessment - 05/13/21 1302     Subjective Patient denies notable pain this AM. She reports being comfortable with SPC use for most  functional mobility. She reports tolerating previous visit well with mild muscle soreness.    Pertinent History Patient is a 30 year old female with involved psych medical history with primary complaint of chronic bilateral knee pain with gait instability and Hx of frequent falls. Patient has had multiple conservative treatments for knee pain including anti-inflammatories, injections. Hx of R ACL sprain and post-injury rehab. She states that recently injections helped for about 3 weeks. Patient reports notable "aching" behind her kneecap with prolonged sitting (pre-patellar pain). Patient reports ongoing pain and dysfunction since 2020. Patient has frequent falls. Patient reports about 10 falls over the last 6 months. She reports direct impact onto her knees often with her falls. Patient had isolated incident of numbness due to notable swelling after fall, but has not felt numbness or paresthesias recently. When questioned about neuro history, patient reports "changes in white matter" and associated migraines (present since 60-64 years old, have remained stable since then). Pt lives with her fiance. Patient has 3 steps to get into her home and pt uses railing. Patient reports she has to negotiate grass on level ground, but she can negotiate this well. Patient reports falls can happen with prolonged standing, walking, and functional reaching while standing. She states her R knee intermittently "gives out." Pt has continuous aching in her knees.    Limitations Standing;Sitting;House hold activities;Walking    Diagnostic tests MRI and knee radiograph - see below    Patient Stated Goals Improved ability to walk, less falls, improved knee pain                TREATMENT     Therapeutic Exercise - for improved soft tissue flexibility and extensibility as needed for ROM, improved strength as needed to improve performance of CKC activities/functional movements     NuStep x 5 minutes, Level 4; for knee  AAROM and warm-up for improved soft tissue mobility/extensibility and muscle performance - interval subjective gathered during this time, 3 minutes unbilled    Sit to stand with raised table; 2x8; with 8-lb Medball Goblet hold   Glute Bridge; 2x8, with Grn Physioball   Seated knee extension; 2x10, 5 sec, bilateral, 10-lb ankle weights   Straight leg raise; 210 each LE, supine, with 5-lb ankle weights      *not today* Ambulate lap around gym x1 with FWW; CGA Quad set; 2x10, 5 sec, bilateral     Neuromuscular Re-education - for postural stability/postural control, gluteal musculature activation and exercises to promote LE kinetic chain stability, dynamic gait stability   In // bars: Long Airex Tandem walk; 5x D/B, forward only Single Airex pad, 6-inch hurdle, 12-inch hurdle; consecutive negotiation of 3 obstacles; 4x D/B       *not today* Gait with SPC; around perimeter of building - including gait over sidewalk, curb cut, grass, decline, curb step up and down x 6 min 3 consecutive Airex pads on floor, step on/over each; 4x D/B Hurdle step over 3 6-inch hurdles; 4x D/B Toe tapping to two stacked 6-inch steps with stance on Airex; 2x10, alternating Sidestepping; 4x D/B, no UE support with BlueTband around thighs superior to patellae  Toe tapping to 12-inch step (two  steps stacked); 1x10 alternating [report of R knee pain toward end of set] [place]quat 0-30 deg; 2x10 Forward and retro stepping; 4x D/B, no UE support Standing march; 2x10 alt, no UE support Standing knee flexion, alternating; 1x15, standing in walker  Standing heel raise/toe raise; 20x alternating PF/DF Sidelying Clamshell; 1x10, on each side (for HEP review)     *not today* Cold pack (unbilled) - for anti-inflammatory and analgesic effect as needed for reduced pain and improved ability to participate in active PT intervention, along bilateral in supine, x 5 minutes       ASSESSMENT Patient is progressing very  well with advanced balance drills and progressive closed-chain strengthening. She is able to continue increasing resistance for open-chain exercises and feels minimally challenged with seated drills at this time. Will emphasize standing activity/functional CKC movements more with successive PT visits and continue with weaning from AD use as able. Pt has not experienced recent episodes of RLE buckling and demonstrates safe ambulation around clinic with cane. Patient is making excellent progress and will benefit from continued skilled PT per POC.     PT Short Term Goals - 04/25/21 0829       PT SHORT TERM GOAL #1   Title Pt will be independent with HEP in order to decrease knee pain and increase strength in order to improve pain-free function at home and work.    Baseline 03/18/21: HEP to be initiated next visit.   04/22/21: Patient is compliant with home exercise program and verbalizes and demonstrates sound understanding of exercises.    Time 3    Period Weeks    Status Achieved    Target Date 04/08/21      PT SHORT TERM GOAL #2   Title Patient will perform sit to stand from standard chair without significant UE support and no increase in baseline pain indicative of improved functional LE strength as needed to prevent fall during episode of LOB and improved ability to transfer    Baseline 03/18/21: Significant difficulty and heavy UE suppot with weight shift to LLE for transferring.   04/22/21: Moderate UE support required for sit to stand from standard height chair; she can perform sit to stand without UE support from raised surface.    Time 6    Period Weeks    Status Not Met    Target Date 05/06/21               PT Long Term Goals - 04/22/21 1011       PT LONG TERM GOAL #1   Title Patient will demonstrate improved function as evidenced by a score of 57 on FOTO measure for full participation in activities at home and in the community.    Baseline 03/18/21: 47.     04/22/21: 67    Time  10    Period Weeks    Status Achieved    Target Date 05/27/21      PT LONG TERM GOAL #2   Title Pt will increase strength of tested lower extremity musculature to at least 4+ MMT grade in order to demonstrate improvement in strength and function required for prolonged weightbearing activity, stair/step and obstacle negotiation    Baseline 03/18/21: Hip/knee musculature 3+ to 4- in RLE, grossly 4/5 in LLE.  04/22/21: Met for all LE muscle groups tested.    Time 10    Period Weeks    Status Achieved    Target Date 05/27/21      PT LONG TERM  GOAL #3   Title Pt will decrease worst pain as reported on NPRS by at least 3 points in order to demonstrate clinically significant reduction in pain.    Baseline 03/18/21: Pain up to 10/10 at worst.    04/22/21: Pain up to 3/10 over the previous week.    Time 10    Period Weeks    Status Achieved    Target Date 05/27/21      PT LONG TERM GOAL #4   Title Patient will ambulate up to 150 feet in hallway with least-restrictive assistive device with no LOB or lower extremity buckling and significant increase in baseline pain by 2 points or greater on NPRS - indicative of improved household-level and short-distance mobility    Baseline 03/18/21: Significant limitation with prolonged standing activity, gait deficits and notable pain with ambulation c front-wheeled walker.   04/22/21: Ambulated 80 feet with SPC only with mild decreased stance time RLE and decreased gait velocity.    Time 10    Period Weeks    Status Partially Met    Target Date 05/27/21      PT LONG TERM GOAL #5   Title Patient will improve BERG by 3 points or greater indicative of decreased risk of falling and improved function for performing functional mobility ADLs and transferring    Baseline 03/18/21: BERG to be performed on visit # 2.  03/23/21: BERG 41/56.  04/22/21: To be performed next visit.    Time 10    Period Weeks    Status Deferred    Target Date 05/27/21                    Plan - 05/13/21 1306     Clinical Impression Statement ?Patient is progressing very well with advanced balance drills and progressive closed-chain strengthening. She is able to continue increasing resistance for open-chain exercises and feels minimally challenged with seated drills at this time. Will emphasize standing activity/functional CKC movements more with successive PT visits and continue with weaning from AD use as able. Pt has not experienced recent episodes of RLE buckling and demonstrates safe ambulation around clinic with cane. Patient is making excellent progress and will benefit from continued skilled PT per POC.    Personal Factors and Comorbidities Comorbidity 3+;Time since onset of injury/illness/exacerbation;Past/Current Experience;Behavior Pattern    Comorbidities Anxiety, depression, Hx of schizoaffective disorder, thyroid disease, obesity, chronic bilateral knee pain, GERD    Examination-Activity Limitations Bed Mobility;Dressing;Stand;Stairs;Bend;Sit;Transfers;Lift;Locomotion Level;Squat    Examination-Participation Restrictions Cleaning    Stability/Clinical Decision Making Unstable/Unpredictable    Rehab Potential Fair    PT Frequency 2x / week    PT Duration Other (comment)   10 weeks   PT Treatment/Interventions Cryotherapy;Electrical Stimulation;ADLs/Self Care Home Management;Gait training;Stair training;Functional mobility training;Therapeutic activities;Therapeutic exercise;Balance training;Neuromuscular re-education;Patient/family education;Manual techniques;Passive range of motion    PT Next Visit Plan Continued with gait training with LRAD, low-impact lower extremity isotonics with increasing emphasis on closed-chain strengthening. Postural stability training and fall risk management.    PT Home Exercise Plan Access Code [email or web address]    Consulted and Agree with Plan of Care Patient             Patient will benefit from skilled therapeutic  intervention in order to improve the following deficits and impairments:  Abnormal gait, Decreased activity tolerance, Decreased range of motion, Decreased strength, Hypomobility, Pain, Difficulty walking, Decreased balance  Visit Diagnosis: Chronic pain of both knees  Difficulty in walking, not elsewhere classified  Muscle weakness (generalized)  Personal history of fall     Problem List Patient Active Problem List   Diagnosis Date Noted   Migraine without aura and without status migrainosus, not intractable 03/05/2021   Mild intermittent asthma without complication 20/80/2233   Gastroesophageal reflux disease 12/01/2020   Hypoglycemia 12/01/2020   History of hypoglycemia 04/26/2020   History of hyperprolactinemia 04/26/2020   Rheumatoid factor positive 12/09/2019   SS-A antibody positive 10/28/2019   Chronic pain of both knees 10/28/2019   Subclinical hypothyroidism 08/02/2019   Hyperprolactinemia ([place]) 08/02/2019   Schizoaffective disorder, depressive type ([place]) 02/02/2018   Suicide attempt ([place]) 01/16/2018   Dysfunctional uterine bleeding 01/06/2018   Borderline personality disorder ([place]) 01/04/2018   PTSD (post-traumatic stress disorder) 01/03/2018   ASCUS of cervix with negative high risk HPV 08/28/2017   Malingering 05/06/2016   Breast tumor 03/05/2016   Schizoaffective disorder, bipolar type ([place]) 11/06/2014   Hypertension 09/29/2014   Valentina Gu, PT, DPT #[id number]  Eilleen Kempf, PT 05/13/2021, 1:06 PM  [facility] [address] [place], [place], [address] Phone: [phone]   Fax:  [phone]  [name] MRN: [id number] Date of Birth: July 27, 1990

## 2021-05-18 ENCOUNTER — Ambulatory Visit: Payer: Medicare Other | Attending: Sports Medicine | Admitting: Physical Therapy

## 2021-05-18 ENCOUNTER — Other Ambulatory Visit: Payer: Self-pay

## 2021-05-18 ENCOUNTER — Encounter: Payer: Self-pay | Admitting: Physical Therapy

## 2021-05-18 DIAGNOSIS — M6281 Muscle weakness (generalized): Secondary | ICD-10-CM | POA: Diagnosis present

## 2021-05-18 DIAGNOSIS — R262 Difficulty in walking, not elsewhere classified: Secondary | ICD-10-CM | POA: Insufficient documentation

## 2021-05-18 DIAGNOSIS — Z9181 History of falling: Secondary | ICD-10-CM | POA: Diagnosis present

## 2021-05-18 DIAGNOSIS — G8929 Other chronic pain: Secondary | ICD-10-CM | POA: Insufficient documentation

## 2021-05-18 DIAGNOSIS — M25561 Pain in right knee: Secondary | ICD-10-CM | POA: Diagnosis not present

## 2021-05-18 DIAGNOSIS — M25562 Pain in left knee: Secondary | ICD-10-CM | POA: Insufficient documentation

## 2021-05-18 NOTE — Therapy (Signed)
[facility] [address] [place], [place], [address] Phone: [phone]   Fax:  [phone]  Physical Therapy Treatment  Patient Details  [name] MRN: [id number] Date of Birth: 02-May-1991 Referring Provider (PT): Rosalia Hammers, MD   Encounter Date: 05/18/2021   PT End of Session - 05/18/21 1304     Visit Number 16    Number of Visits 21    Date for PT Re-Evaluation 05/27/21    Authorization Type Medicare/Medicaid, VL based on medical necessity, no auth    Authorization Time Period Cert 67/59/16-3/84/66, prog note 04/22/21    Progress Note Due on Visit 19    PT Start Time 1300    PT Stop Time 1347    PT Time Calculation (min) 47 min    Equipment Utilized During Treatment Gait belt;Other (comment)   front-wheel walker (patient has her own walker), SPC   Activity Tolerance Patient tolerated treatment well    Behavior During Therapy [facility] for tasks assessed/performed             Past Medical History:  Diagnosis Date   Allergy    Anxiety    Arthritis    Asthma    Depression    Foreign body aspiration    GERD (gastroesophageal reflux disease)    Hallucinations 09/30/2014   Sizoaffective   Hyperlipidemia    Hypertension    Intentional ingestion of batteries 04/21/2016    2 AAA batteries and 1 thumb tack; intent to hurt herself/notes 59/01/3569   Self-inflicted injury 17/79/3903   Suicidal ideation 01/02/2018   Tardive dyskinesia 10/2014   recent onset   Thyroid disease     Past Surgical History:  Procedure Laterality Date   ABDOMINAL SURGERY     "years ago" to remove foreign objects   APPENDECTOMY     BREAST EXCISIONAL BIOPSY Right 09/03/2007   surgical bx removed fibroadeoma   BREAST LUMPECTOMY Right    COLONOSCOPY WITH PROPOFOL N/A 09/10/2015   Procedure: COLONOSCOPY WITH PROPOFOL;  Surgeon: [name], MD;  Location: [facility] ENDOSCOPY;  Service: Endoscopy;  Laterality: N/A;    ESOPHAGOGASTRODUODENOSCOPY N/A 11/28/2014   Procedure: ESOPHAGOGASTRODUODENOSCOPY (EGD);  Surgeon: [name], MD;  Location: [facility] ENDOSCOPY;  Service: Endoscopy;  Laterality: N/A;   ESOPHAGOGASTRODUODENOSCOPY N/A 02/21/2016   Procedure: ESOPHAGOGASTRODUODENOSCOPY (EGD);  Surgeon: Mauri Pole, MD;  Location: [facility] ENDOSCOPY;  Service: Endoscopy;  Laterality: N/A;   ESOPHAGOGASTRODUODENOSCOPY N/A 04/21/2016   Procedure: ESOPHAGOGASTRODUODENOSCOPY (EGD);  Surgeon: Gatha Mayer, MD;  Location: [facility] ENDOSCOPY;  Service: Endoscopy;  Laterality: N/A;   ESOPHAGOGASTRODUODENOSCOPY N/A 05/06/2016   Procedure: ESOPHAGOGASTRODUODENOSCOPY (EGD);  Surgeon: Jonathon Bellows, MD;  Location: [facility] ENDOSCOPY;  Service: Gastroenterology;  Laterality: N/A;   ESOPHAGOGASTRODUODENOSCOPY N/A 06/13/2016   Procedure: ESOPHAGOGASTRODUODENOSCOPY (EGD);  Surgeon: [name], MD;  Location: [facility] ENDOSCOPY;  Service: Endoscopy;  Laterality: N/A;   ESOPHAGOGASTRODUODENOSCOPY (EGD) WITH PROPOFOL N/A 02/29/2016   Procedure: ESOPHAGOGASTRODUODENOSCOPY (EGD) WITH PROPOFOL;  Surgeon: [name], MD;  Location: [facility] ENDOSCOPY;  Service: Endoscopy;  Laterality: N/A;   ESOPHAGOGASTRODUODENOSCOPY (EGD) WITH PROPOFOL N/A 01/15/2018   Procedure: ESOPHAGOGASTRODUODENOSCOPY (EGD) WITH PROPOFOL;  Surgeon: Daneil Dolin, MD;  Location: [facility] ENDO SUITE;  Service: Endoscopy;  Laterality: N/A;  retrieval forein body   LAPAROTOMY N/A 09/12/2015   Procedure: EXPLORATORY LAPAROTOMY;  Surgeon: Florene Glen, MD;  Location: [facility] ORS;  Service: General;  Laterality: N/A;   SIGMOIDOSCOPY N/A 09/12/2015   Procedure: Lonell Face;  Surgeon: Florene Glen,  MD;  Location: [facility] ORS;  Service: General;  Laterality: N/A;   WISDOM TOOTH EXTRACTION      There were no vitals filed for this visit.   Subjective Assessment - 05/18/21 1304     Subjective Patient denies notable pain this afternoon. She reports good progress since start of PT.  Patient reports doing well with HEP. Patient reports tolerating last session well.    Pertinent History Patient is a 31 year old female with involved psych medical history with primary complaint of chronic bilateral knee pain with gait instability and Hx of frequent falls. Patient has had multiple conservative treatments for knee pain including anti-inflammatories, injections. Hx of R ACL sprain and post-injury rehab. She states that recently injections helped for about 3 weeks. Patient reports notable "aching" behind her kneecap with prolonged sitting (pre-patellar pain). Patient reports ongoing pain and dysfunction since 2020. Patient has frequent falls. Patient reports about 10 falls over the last 6 months. She reports direct impact onto her knees often with her falls. Patient had isolated incident of numbness due to notable swelling after fall, but has not felt numbness or paresthesias recently. When questioned about neuro history, patient reports "changes in white matter" and associated migraines (present since [age] years old, have remained stable since then). Pt lives with her fiance. Patient has 3 steps to get into her home and pt uses railing. Patient reports she has to negotiate grass on level ground, but she can negotiate this well. Patient reports falls can happen with prolonged standing, walking, and functional reaching while standing. She states her R knee intermittently "gives out." Pt has continuous aching in her knees.    Limitations Standing;Sitting;House hold activities;Walking    Diagnostic tests MRI and knee radiograph - see below    Patient Stated Goals Improved ability to walk, less falls, improved knee pain                  TREATMENT     Therapeutic Exercise - for improved soft tissue flexibility and extensibility as needed for ROM, improved strength as needed to improve performance of CKC activities/functional movements     NuStep x 5 minutes, Level 5; for knee AAROM and  warm-up for improved soft tissue mobility/extensibility and muscle performance    Sit to stand with raised table; 2x10; with 8-lb Medball Goblet hold   Glute Bridge; 2x10, with Grn Physioball   Straight leg raise; 2x10 each LE, supine, with 5-lb ankle weights   Patient education: HEP update and review (see [employer] Access Code)     *not today* Ambulate lap around gym x1 with FWW; CGA Quad set; 2x10, 5 sec, bilateral     Neuromuscular Re-education - for postural stability/postural control, gluteal musculature activation and exercises to promote LE kinetic chain stability, dynamic gait stability   Adjacent to treadmill: Long Airex Tandem walk; 5x D/B, forward only Single Airex pad, balance bubble, 12-inch hurdle; consecutive negotiation of 3 obstacles; 4x D/B  Forward step-up, 6-inch step (staircase in middle of gym); x10 each LE  3-way Hip; 4-lb ankle weight bilat LE; x5 ea dir on each LE     *not today* Gait with SPC; around perimeter of building - including gait over sidewalk, curb cut, grass, decline, curb step up and down x 6 min 3 consecutive Airex pads on floor, step on/over each; 4x D/B Hurdle step over 3 6-inch hurdles; 4x D/B Toe tapping to two stacked 6-inch steps with stance on Airex; 2x10, alternating Sidestepping; 4x D/B, no UE  support with BlueTband around thighs superior to patellae  Toe tapping to 12-inch step (two steps stacked); 1x10 alternating [report of R knee pain toward end of set] Minisquat 0-30 deg; 2x10           ASSESSMENT Patient demonstrates safe ambulation in gym for 2 laps around gym with no AD with decreased gait velocity, but no buckling or other major gait deviations. She is proficient with SPC use at this time and is appropriate for using [facility] for most community outings at this time (discussed using FWW only if patient feels uncomfortable with length of walking/standing for a particular outing). We continued progression of closed-chain  strengthening and advanced balance drills today without notable pain or LOB. Patient is making excellent progress and will benefit from continued skilled PT per POC.      PT Short Term Goals - 04/25/21 0829       PT SHORT TERM GOAL #1   Title Pt will be independent with HEP in order to decrease knee pain and increase strength in order to improve pain-free function at home and work.    Baseline 03/18/21: HEP to be initiated next visit.   04/22/21: Patient is compliant with home exercise program and verbalizes and demonstrates sound understanding of exercises.    Time 3    Period Weeks    Status Achieved    Target Date 04/08/21      PT SHORT TERM GOAL #2   Title Patient will perform sit to stand from standard chair without significant UE support and no increase in baseline pain indicative of improved functional LE strength as needed to prevent fall during episode of LOB and improved ability to transfer    Baseline 03/18/21: Significant difficulty and heavy UE suppot with weight shift to LLE for transferring.   04/22/21: Moderate UE support required for sit to stand from standard height chair; she can perform sit to stand without UE support from raised surface.    Time 6    Period Weeks    Status Not Met    Target Date 05/06/21               PT Long Term Goals - 04/22/21 1011       PT LONG TERM GOAL #1   Title Patient will demonstrate improved function as evidenced by a score of 57 on FOTO measure for full participation in activities at home and in the community.    Baseline 03/18/21: 47.     04/22/21: 67    Time 10    Period Weeks    Status Achieved    Target Date 05/27/21      PT LONG TERM GOAL #2   Title Pt will increase strength of tested lower extremity musculature to at least 4+ MMT grade in order to demonstrate improvement in strength and function required for prolonged weightbearing activity, stair/step and obstacle negotiation    Baseline 03/18/21: Hip/knee musculature 3+ to  4- in RLE, grossly 4/5 in LLE.  04/22/21: Met for all LE muscle groups tested.    Time 10    Period Weeks    Status Achieved    Target Date 05/27/21      PT LONG TERM GOAL #3   Title Pt will decrease worst pain as reported on NPRS by at least 3 points in order to demonstrate clinically significant reduction in pain.    Baseline 03/18/21: Pain up to 10/10 at worst.    04/22/21: Pain up to 3/10  over the previous week.    Time 10    Period Weeks    Status Achieved    Target Date 05/27/21      PT LONG TERM GOAL #4   Title Patient will ambulate up to 150 feet in hallway with least-restrictive assistive device with no LOB or lower extremity buckling and significant increase in baseline pain by 2 points or greater on NPRS - indicative of improved household-level and short-distance mobility    Baseline 03/18/21: Significant limitation with prolonged standing activity, gait deficits and notable pain with ambulation c front-wheeled walker.   04/22/21: Ambulated 80 feet with SPC only with mild decreased stance time RLE and decreased gait velocity.    Time 10    Period Weeks    Status Partially Met    Target Date 05/27/21      PT LONG TERM GOAL #5   Title Patient will improve BERG by 3 points or greater indicative of decreased risk of falling and improved function for performing functional mobility ADLs and transferring    Baseline 03/18/21: BERG to be performed on visit # 2.  03/23/21: BERG 41/56.  04/22/21: To be performed next visit.    Time 10    Period Weeks    Status Deferred    Target Date 05/27/21                   Plan - 05/18/21 1403     Clinical Impression Statement Patient demonstrates safe ambulation in gym for 2 laps around gym with no AD with decreased gait velocity, but no buckling or other major gait deviations. She is proficient with SPC use at this time and is appropriate for using [facility] for most community outings at this time (discussed using FWW only if patient feels  uncomfortable with length of walking/standing for a particular outing). We continued progression of closed-chain strengthening and advanced balance drills today without notable pain or LOB. Patient is making excellent progress and will benefit from continued skilled PT per POC.    Personal Factors and Comorbidities Comorbidity 3+;Time since onset of injury/illness/exacerbation;Past/Current Experience;Behavior Pattern    Comorbidities Anxiety, depression, Hx of schizoaffective disorder, thyroid disease, obesity, chronic bilateral knee pain, GERD    Examination-Activity Limitations Bed Mobility;Dressing;Stand;Stairs;Bend;Sit;Transfers;Lift;Locomotion Level;Squat    Examination-Participation Restrictions Cleaning    Stability/Clinical Decision Making Unstable/Unpredictable    Rehab Potential Fair    PT Frequency 2x / week    PT Duration Other (comment)   10 weeks   PT Treatment/Interventions Cryotherapy;Electrical Stimulation;ADLs/Self Care Home Management;Gait training;Stair training;Functional mobility training;Therapeutic activities;Therapeutic exercise;Balance training;Neuromuscular re-education;Patient/family education;Manual techniques;Passive range of motion    PT Next Visit Plan Continued with gait training with LRAD, low-impact lower extremity isotonics with increasing emphasis on closed-chain strengthening. Postural stability training and fall risk management.    PT Home Exercise Plan Access Code [email or web address]    Consulted and Agree with Plan of Care Patient             Patient will benefit from skilled therapeutic intervention in order to improve the following deficits and impairments:  Abnormal gait, Decreased activity tolerance, Decreased range of motion, Decreased strength, Hypomobility, Pain, Difficulty walking, Decreased balance  Visit Diagnosis: Chronic pain of both knees  Difficulty in walking, not elsewhere classified  Muscle weakness (generalized)  Personal history of  fall     Problem List Patient Active Problem List   Diagnosis Date Noted   Migraine without aura and without status migrainosus, not intractable 03/05/2021  Mild intermittent asthma without complication 15/86/8257   Gastroesophageal reflux disease 12/01/2020   Hypoglycemia 12/01/2020   History of hypoglycemia 04/26/2020   History of hyperprolactinemia 04/26/2020   Rheumatoid factor positive 12/09/2019   SS-A antibody positive 10/28/2019   Chronic pain of both knees 10/28/2019   Subclinical hypothyroidism 08/02/2019   Hyperprolactinemia ([place]) 08/02/2019   Schizoaffective disorder, depressive type ([place]) 02/02/2018   Suicide attempt ([place]) 01/16/2018   Dysfunctional uterine bleeding 01/06/2018   Borderline personality disorder ([place]) 01/04/2018   PTSD (post-traumatic stress disorder) 01/03/2018   ASCUS of cervix with negative high risk HPV 08/28/2017   Malingering 05/06/2016   Breast tumor 03/05/2016   Schizoaffective disorder, bipolar type ([place]) 11/06/2014   Hypertension 09/29/2014   Valentina Gu, PT, DPT #[id number]  Eilleen Kempf, PT 05/18/2021, 2:06 PM  [facility] [address] [place], [place], [address] Phone: [phone]   Fax:  [phone]  [name] MRN: [id number] Date of Birth: 02/24/1991

## 2021-05-20 ENCOUNTER — Encounter: Payer: Self-pay | Admitting: Physical Therapy

## 2021-05-20 ENCOUNTER — Ambulatory Visit: Payer: Medicare Other | Admitting: Physical Therapy

## 2021-05-20 ENCOUNTER — Other Ambulatory Visit: Payer: Self-pay

## 2021-05-20 DIAGNOSIS — G8929 Other chronic pain: Secondary | ICD-10-CM

## 2021-05-20 DIAGNOSIS — M25561 Pain in right knee: Secondary | ICD-10-CM | POA: Diagnosis not present

## 2021-05-20 DIAGNOSIS — Z9181 History of falling: Secondary | ICD-10-CM

## 2021-05-20 DIAGNOSIS — M6281 Muscle weakness (generalized): Secondary | ICD-10-CM

## 2021-05-20 DIAGNOSIS — M25562 Pain in left knee: Secondary | ICD-10-CM

## 2021-05-20 DIAGNOSIS — R262 Difficulty in walking, not elsewhere classified: Secondary | ICD-10-CM

## 2021-05-20 NOTE — Therapy (Signed)
[facility] [address] [place], [place], [address] Phone: [phone]   Fax:  [phone]  Physical Therapy Treatment  Patient Details  [name] MRN: [id number] Date of Birth: 03-04-1991 Referring Provider (PT): Rosalia Hammers, MD   Encounter Date: 05/20/2021   PT End of Session - 05/20/21 1254     Visit Number 17    Number of Visits 21    Date for PT Re-Evaluation 05/27/21    Authorization Type Medicare/Medicaid, VL based on medical necessity, no auth    Authorization Time Period Cert 40/10/27-2/53/66, prog note 04/22/21    Progress Note Due on Visit 19    PT Start Time 1250    PT Stop Time 1335    PT Time Calculation (min) 45 min    Equipment Utilized During Treatment Gait belt;Other (comment)   front-wheel walker (patient has her own walker), SPC   Activity Tolerance Patient tolerated treatment well    Behavior During Therapy [facility] for tasks assessed/performed             Past Medical History:  Diagnosis Date   Allergy    Anxiety    Arthritis    Asthma    Depression    Foreign body aspiration    GERD (gastroesophageal reflux disease)    Hallucinations 09/30/2014   Sizoaffective   Hyperlipidemia    Hypertension    Intentional ingestion of batteries 04/21/2016    2 AAA batteries and 1 thumb tack; intent to hurt herself/notes 44/0/3474   Self-inflicted injury 25/95/6387   Suicidal ideation 01/02/2018   Tardive dyskinesia 10/2014   recent onset   Thyroid disease     Past Surgical History:  Procedure Laterality Date   ABDOMINAL SURGERY     "years ago" to remove foreign objects   APPENDECTOMY     BREAST EXCISIONAL BIOPSY Right 09/03/2007   surgical bx removed fibroadeoma   BREAST LUMPECTOMY Right    COLONOSCOPY WITH PROPOFOL N/A 09/10/2015   Procedure: COLONOSCOPY WITH PROPOFOL;  Surgeon: [name], MD;  Location: [facility] ENDOSCOPY;  Service: Endoscopy;  Laterality: N/A;    ESOPHAGOGASTRODUODENOSCOPY N/A 11/28/2014   Procedure: ESOPHAGOGASTRODUODENOSCOPY (EGD);  Surgeon: [name], MD;  Location: [facility] ENDOSCOPY;  Service: Endoscopy;  Laterality: N/A;   ESOPHAGOGASTRODUODENOSCOPY N/A 02/21/2016   Procedure: ESOPHAGOGASTRODUODENOSCOPY (EGD);  Surgeon: Mauri Pole, MD;  Location: [facility] ENDOSCOPY;  Service: Endoscopy;  Laterality: N/A;   ESOPHAGOGASTRODUODENOSCOPY N/A 04/21/2016   Procedure: ESOPHAGOGASTRODUODENOSCOPY (EGD);  Surgeon: Gatha Mayer, MD;  Location: [facility] ENDOSCOPY;  Service: Endoscopy;  Laterality: N/A;   ESOPHAGOGASTRODUODENOSCOPY N/A 05/06/2016   Procedure: ESOPHAGOGASTRODUODENOSCOPY (EGD);  Surgeon: Jonathon Bellows, MD;  Location: [facility] ENDOSCOPY;  Service: Gastroenterology;  Laterality: N/A;   ESOPHAGOGASTRODUODENOSCOPY N/A 06/13/2016   Procedure: ESOPHAGOGASTRODUODENOSCOPY (EGD);  Surgeon: [name], MD;  Location: [facility] ENDOSCOPY;  Service: Endoscopy;  Laterality: N/A;   ESOPHAGOGASTRODUODENOSCOPY (EGD) WITH PROPOFOL N/A 02/29/2016   Procedure: ESOPHAGOGASTRODUODENOSCOPY (EGD) WITH PROPOFOL;  Surgeon: [name], MD;  Location: [facility] ENDOSCOPY;  Service: Endoscopy;  Laterality: N/A;   ESOPHAGOGASTRODUODENOSCOPY (EGD) WITH PROPOFOL N/A 01/15/2018   Procedure: ESOPHAGOGASTRODUODENOSCOPY (EGD) WITH PROPOFOL;  Surgeon: Daneil Dolin, MD;  Location: [facility] ENDO SUITE;  Service: Endoscopy;  Laterality: N/A;  retrieval forein body   LAPAROTOMY N/A 09/12/2015   Procedure: EXPLORATORY LAPAROTOMY;  Surgeon: Florene Glen, MD;  Location: [facility] ORS;  Service: General;  Laterality: N/A;   SIGMOIDOSCOPY N/A 09/12/2015   Procedure: Lonell Face;  Surgeon: Florene Glen,  MD;  Location: [facility] ORS;  Service: General;  Laterality: N/A;   WISDOM TOOTH EXTRACTION      There were no vitals filed for this visit.   Subjective Assessment - 05/20/21 1303     Subjective Patient reports completing walking program at home (static versus dynamic walking) and  starting Atkins diet which was discussed with her endocrinologist to address her weight loss goals. Patient reports no knee pain at arrival to PT. Patient voices no recent safety concerns or major incidents; no recent R knee buckling. Pt is compliant with HEP.    Pertinent History Patient is a 31 year old female with involved psych medical history with primary complaint of chronic bilateral knee pain with gait instability and Hx of frequent falls. Patient has had multiple conservative treatments for knee pain including anti-inflammatories, injections. Hx of R ACL sprain and post-injury rehab. She states that recently injections helped for about 3 weeks. Patient reports notable "aching" behind her kneecap with prolonged sitting (pre-patellar pain). Patient reports ongoing pain and dysfunction since 2020. Patient has frequent falls. Patient reports about 10 falls over the last 6 months. She reports direct impact onto her knees often with her falls. Patient had isolated incident of numbness due to notable swelling after fall, but has not felt numbness or paresthesias recently. When questioned about neuro history, patient reports "changes in white matter" and associated migraines (present since 98-18 years old, have remained stable since then). Pt lives with her fiance. Patient has 3 steps to get into her home and pt uses railing. Patient reports she has to negotiate grass on level ground, but she can negotiate this well. Patient reports falls can happen with prolonged standing, walking, and functional reaching while standing. She states her R knee intermittently "gives out." Pt has continuous aching in her knees.    Limitations Standing;Sitting;House hold activities;Walking    Diagnostic tests MRI and knee radiograph - see below    Patient Stated Goals Improved ability to walk, less falls, improved knee pain                  TREATMENT     Therapeutic Exercise - for improved soft tissue flexibility and  extensibility as needed for ROM, improved strength as needed to improve performance of CKC activities/functional movements     NuStep x 5 minutes, Level 5; for knee AAROM and warm-up for improved soft tissue mobility/extensibility and muscle performance    Sit to stand with raised table; 2x10; with 10-lb Medball Goblet hold   Glute Bridge; 2x10, with Grn Physioball   Straight leg raise; 2x12 each LE, supine, with 5-lb ankle weights      *not today* Ambulate lap around gym x1 with FWW; CGA Quad set; 2x10, 5 sec, bilateral     Neuromuscular Re-education - for postural stability/postural control, gluteal musculature activation and exercises to promote LE kinetic chain stability, dynamic gait stability   In // bars: Long Airex Tandem walk; 5x D/B, forward only Single Airex pad, balance bubble, 6-inch hurdle; consecutive negotiation of 3 obstacles; 4x D/B   Forward step-up with opposite knee driver, 6-inch step (staircase in middle of gym); 2x10 each LE   3-way Hip; 4-lb ankle weight bilat LE; x8 ea dir on each LE     *not today* Gait with SPC; around perimeter of building - including gait over sidewalk, curb cut, grass, decline, curb step up and down x 6 min 3 consecutive Airex pads on floor, step on/over each; 4x D/B Hurdle step  over 3 6-inch hurdles; 4x D/B Toe tapping to two stacked 6-inch steps with stance on Airex; 2x10, alternating Sidestepping; 4x D/B, no UE support with BlueTband around thighs superior to patellae  Toe tapping to 12-inch step (two steps stacked); 1x10 alternating [report of R knee pain toward end of set] Minisquat 0-30 deg; 2x10             ASSESSMENT Patient demonstrates significantly improving gait pattern and has not experienced significant knee pain per subjective feedback given over the last 2 weeks. She is making excellent progress with progression of weightbearing activity and balance drills in clinic. Patient is making excellent progress and  will benefit from continued skilled PT per POC.     PT Short Term Goals - 04/25/21 0829       PT SHORT TERM GOAL #1   Title Pt will be independent with HEP in order to decrease knee pain and increase strength in order to improve pain-free function at home and work.    Baseline 03/18/21: HEP to be initiated next visit.   04/22/21: Patient is compliant with home exercise program and verbalizes and demonstrates sound understanding of exercises.    Time 3    Period Weeks    Status Achieved    Target Date 04/08/21      PT SHORT TERM GOAL #2   Title Patient will perform sit to stand from standard chair without significant UE support and no increase in baseline pain indicative of improved functional LE strength as needed to prevent fall during episode of LOB and improved ability to transfer    Baseline 03/18/21: Significant difficulty and heavy UE suppot with weight shift to LLE for transferring.   04/22/21: Moderate UE support required for sit to stand from standard height chair; she can perform sit to stand without UE support from raised surface.    Time 6    Period Weeks    Status Not Met    Target Date 05/06/21               PT Long Term Goals - 04/22/21 1011       PT LONG TERM GOAL #1   Title Patient will demonstrate improved function as evidenced by a score of 57 on FOTO measure for full participation in activities at home and in the community.    Baseline 03/18/21: 47.     04/22/21: 67    Time 10    Period Weeks    Status Achieved    Target Date 05/27/21      PT LONG TERM GOAL #2   Title Pt will increase strength of tested lower extremity musculature to at least 4+ MMT grade in order to demonstrate improvement in strength and function required for prolonged weightbearing activity, stair/step and obstacle negotiation    Baseline 03/18/21: Hip/knee musculature 3+ to 4- in RLE, grossly 4/5 in LLE.  04/22/21: Met for all LE muscle groups tested.    Time 10    Period Weeks    Status  Achieved    Target Date 05/27/21      PT LONG TERM GOAL #3   Title Pt will decrease worst pain as reported on NPRS by at least 3 points in order to demonstrate clinically significant reduction in pain.    Baseline 03/18/21: Pain up to 10/10 at worst.    04/22/21: Pain up to 3/10 over the previous week.    Time 10    Period Weeks    Status  Achieved    Target Date 05/27/21      PT LONG TERM GOAL #4   Title Patient will ambulate up to 150 feet in hallway with least-restrictive assistive device with no LOB or lower extremity buckling and significant increase in baseline pain by 2 points or greater on NPRS - indicative of improved household-level and short-distance mobility    Baseline 03/18/21: Significant limitation with prolonged standing activity, gait deficits and notable pain with ambulation c front-wheeled walker.   04/22/21: Ambulated 80 feet with SPC only with mild decreased stance time RLE and decreased gait velocity.    Time 10    Period Weeks    Status Partially Met    Target Date 05/27/21      PT LONG TERM GOAL #5   Title Patient will improve BERG by 3 points or greater indicative of decreased risk of falling and improved function for performing functional mobility ADLs and transferring    Baseline 03/18/21: BERG to be performed on visit # 2.  03/23/21: BERG 41/56.  04/22/21: To be performed next visit.    Time 10    Period Weeks    Status Deferred    Target Date 05/27/21                   Plan - 05/20/21 2245     Clinical Impression Statement Patient demonstrates significantly improving gait pattern and has not experienced significant knee pain per subjective feedback given over the last 2 weeks. She is making excellent progress with progression of weightbearing activity and balance drills in clinic. Patient is making excellent progress and will benefit from continued skilled PT per POC.    Personal Factors and Comorbidities Comorbidity 3+;Time since onset of  injury/illness/exacerbation;Past/Current Experience;Behavior Pattern    Comorbidities Anxiety, depression, Hx of schizoaffective disorder, thyroid disease, obesity, chronic bilateral knee pain, GERD    Examination-Activity Limitations Bed Mobility;Dressing;Stand;Stairs;Bend;Sit;Transfers;Lift;Locomotion Level;Squat    Examination-Participation Restrictions Cleaning    Stability/Clinical Decision Making Unstable/Unpredictable    Rehab Potential Fair    PT Frequency 2x / week    PT Duration Other (comment)   10 weeks   PT Treatment/Interventions Cryotherapy;Electrical Stimulation;ADLs/Self Care Home Management;Gait training;Stair training;Functional mobility training;Therapeutic activities;Therapeutic exercise;Balance training;Neuromuscular re-education;Patient/family education;Manual techniques;Passive range of motion    PT Next Visit Plan Continued with gait training with LRAD, low-impact lower extremity isotonics with increasing emphasis on closed-chain strengthening. Postural stability training and fall risk management.    PT Home Exercise Plan Access Code [email or web address]    Consulted and Agree with Plan of Care Patient             Patient will benefit from skilled therapeutic intervention in order to improve the following deficits and impairments:  Abnormal gait, Decreased activity tolerance, Decreased range of motion, Decreased strength, Hypomobility, Pain, Difficulty walking, Decreased balance  Visit Diagnosis: Chronic pain of both knees  Difficulty in walking, not elsewhere classified  Muscle weakness (generalized)  Personal history of fall     Problem List Patient Active Problem List   Diagnosis Date Noted   Migraine without aura and without status migrainosus, not intractable 03/05/2021   Mild intermittent asthma without complication 93/79/0240   Gastroesophageal reflux disease 12/01/2020   Hypoglycemia 12/01/2020   History of hypoglycemia 04/26/2020   History of  hyperprolactinemia 04/26/2020   Rheumatoid factor positive 12/09/2019   SS-A antibody positive 10/28/2019   Chronic pain of both knees 10/28/2019   Subclinical hypothyroidism 08/02/2019   Hyperprolactinemia ([place]) 08/02/2019   Schizoaffective  disorder, depressive type ([place]) 02/02/2018   Suicide attempt ([place]) 01/16/2018   Dysfunctional uterine bleeding 01/06/2018   Borderline personality disorder ([place]) 01/04/2018   PTSD (post-traumatic stress disorder) 01/03/2018   ASCUS of cervix with negative high risk HPV 08/28/2017   Malingering 05/06/2016   Breast tumor 03/05/2016   Schizoaffective disorder, bipolar type ([place]) 11/06/2014   Hypertension 09/29/2014   Valentina Gu, PT, DPT #[id number]  Eilleen Kempf, PT 05/20/2021, 10:45 PM  [facility] [address] [place], [place], [address] Phone: [phone]   Fax:  [phone]  [name] MRN: [id number] Date of Birth: 09-25-1990

## 2021-05-25 ENCOUNTER — Ambulatory Visit: Payer: Medicare Other | Admitting: Physical Therapy

## 2021-05-25 ENCOUNTER — Other Ambulatory Visit: Payer: Self-pay

## 2021-05-25 DIAGNOSIS — Z9181 History of falling: Secondary | ICD-10-CM

## 2021-05-25 DIAGNOSIS — M25562 Pain in left knee: Secondary | ICD-10-CM

## 2021-05-25 DIAGNOSIS — M25561 Pain in right knee: Secondary | ICD-10-CM | POA: Diagnosis not present

## 2021-05-25 DIAGNOSIS — G8929 Other chronic pain: Secondary | ICD-10-CM

## 2021-05-25 DIAGNOSIS — R262 Difficulty in walking, not elsewhere classified: Secondary | ICD-10-CM

## 2021-05-25 DIAGNOSIS — M6281 Muscle weakness (generalized): Secondary | ICD-10-CM

## 2021-05-25 NOTE — Therapy (Signed)
[facility] [address] [place], [place], [address] Phone: [phone]   Fax:  [phone]  Physical Therapy Treatment/ Physical Therapy Progress Note/Re-certification   Dates of reporting period  04/22/21   to   05/25/2021   Patient Details  [name] MRN: [id number] Date of Birth: [birth year] Referring Provider (PT): Rosalia Hammers, MD   Encounter Date: 05/25/2021   PT End of Session - 05/25/21 1346     Visit Number 18    Number of Visits 23    Date for PT Re-Evaluation 05/27/21    Authorization Type Medicare/Medicaid, VL based on medical necessity, no auth    Authorization Time Period Cert 54/65/68-06/11/49, prog note 04/22/21    Progress Note Due on Visit 28    PT Start Time 1245    PT Stop Time 1335    PT Time Calculation (min) 50 min    Equipment Utilized During Treatment Gait belt;Other (comment)   front-wheel walker (patient has her own walker), SPC   Activity Tolerance Patient tolerated treatment well    Behavior During Therapy [facility] for tasks assessed/performed             Past Medical History:  Diagnosis Date   Allergy    Anxiety    Arthritis    Asthma    Depression    Foreign body aspiration    GERD (gastroesophageal reflux disease)    Hallucinations 09/30/2014   Sizoaffective   Hyperlipidemia    Hypertension    Intentional ingestion of batteries 04/21/2016    2 AAA batteries and 1 thumb tack; intent to hurt herself/notes 70/0/1749   Self-inflicted injury 44/96/7591   Suicidal ideation 01/02/2018   Tardive dyskinesia 10/2014   recent onset   Thyroid disease     Past Surgical History:  Procedure Laterality Date   ABDOMINAL SURGERY     "years ago" to remove foreign objects   APPENDECTOMY     BREAST EXCISIONAL BIOPSY Right 09/03/2007   surgical bx removed fibroadeoma   BREAST LUMPECTOMY Right    COLONOSCOPY WITH PROPOFOL N/A 09/10/2015   Procedure: COLONOSCOPY WITH PROPOFOL;   Surgeon: [name], MD;  Location: [facility] ENDOSCOPY;  Service: Endoscopy;  Laterality: N/A;   ESOPHAGOGASTRODUODENOSCOPY N/A 11/28/2014   Procedure: ESOPHAGOGASTRODUODENOSCOPY (EGD);  Surgeon: [name], MD;  Location: [facility] ENDOSCOPY;  Service: Endoscopy;  Laterality: N/A;   ESOPHAGOGASTRODUODENOSCOPY N/A 02/21/2016   Procedure: ESOPHAGOGASTRODUODENOSCOPY (EGD);  Surgeon: Mauri Pole, MD;  Location: [facility] ENDOSCOPY;  Service: Endoscopy;  Laterality: N/A;   ESOPHAGOGASTRODUODENOSCOPY N/A 04/21/2016   Procedure: ESOPHAGOGASTRODUODENOSCOPY (EGD);  Surgeon: Gatha Mayer, MD;  Location: [facility] ENDOSCOPY;  Service: Endoscopy;  Laterality: N/A;   ESOPHAGOGASTRODUODENOSCOPY N/A 05/06/2016   Procedure: ESOPHAGOGASTRODUODENOSCOPY (EGD);  Surgeon: Jonathon Bellows, MD;  Location: [facility] ENDOSCOPY;  Service: Gastroenterology;  Laterality: N/A;   ESOPHAGOGASTRODUODENOSCOPY N/A 06/13/2016   Procedure: ESOPHAGOGASTRODUODENOSCOPY (EGD);  Surgeon: [name], MD;  Location: [facility] ENDOSCOPY;  Service: Endoscopy;  Laterality: N/A;   ESOPHAGOGASTRODUODENOSCOPY (EGD) WITH PROPOFOL N/A 02/29/2016   Procedure: ESOPHAGOGASTRODUODENOSCOPY (EGD) WITH PROPOFOL;  Surgeon: [name], MD;  Location: [facility] ENDOSCOPY;  Service: Endoscopy;  Laterality: N/A;   ESOPHAGOGASTRODUODENOSCOPY (EGD) WITH PROPOFOL N/A 01/15/2018   Procedure: ESOPHAGOGASTRODUODENOSCOPY (EGD) WITH PROPOFOL;  Surgeon: Daneil Dolin, MD;  Location: [facility] ENDO SUITE;  Service: Endoscopy;  Laterality: N/A;  retrieval forein body   LAPAROTOMY N/A 09/12/2015   Procedure: EXPLORATORY LAPAROTOMY;  Surgeon: Florene Glen, MD;  Location: [facility] ORS;  Service: General;  Laterality: N/A;   SIGMOIDOSCOPY N/A 09/12/2015   Procedure: Lonell Face;  Surgeon: Florene Glen, MD;  Location: [facility] ORS;  Service: General;  Laterality: N/A;   WISDOM TOOTH EXTRACTION      There were no vitals filed for this visit.   Subjective Assessment - 05/25/21 1248      Subjective Patient reports no pain today. She reports pain 2/10 at highest over the last week. Patient reports more aching when moving about in cold weather. Pt reports 89-90% SANE score at arrival. Patient reports doing well with negotiating steps to get into her home. Patient reports no recent R lower limb buckling. Patient reports significant benefit from therapy to date.    Pertinent History Patient is a 31 year old female with involved psych medical history with primary complaint of chronic bilateral knee pain with gait instability and Hx of frequent falls. Patient has had multiple conservative treatments for knee pain including anti-inflammatories, injections. Hx of R ACL sprain and post-injury rehab. She states that recently injections helped for about 3 weeks. Patient reports notable "aching" behind her kneecap with prolonged sitting (pre-patellar pain). Patient reports ongoing pain and dysfunction since 2020. Patient has frequent falls. Patient reports about 10 falls over the last 6 months. She reports direct impact onto her knees often with her falls. Patient had isolated incident of numbness due to notable swelling after fall, but has not felt numbness or paresthesias recently. When questioned about neuro history, patient reports "changes in white matter" and associated migraines (present since 63-83 years old, have remained stable since then). Pt lives with her fiance. Patient has 3 steps to get into her home and pt uses railing. Patient reports she has to negotiate grass on level ground, but she can negotiate this well. Patient reports falls can happen with prolonged standing, walking, and functional reaching while standing. She states her R knee intermittently "gives out." Pt has continuous aching in her knees.    Limitations Standing;Sitting;House hold activities;Walking    Diagnostic tests MRI and knee radiograph - see below    Patient Stated Goals Improved ability to walk, less falls, improved  knee pain                [facility] PT Assessment - 05/25/21 1304       Berg Balance Test   Sit to Stand Able to stand without using hands and stabilize independently    Standing Unsupported Able to stand safely 2 minutes    Sitting with Back Unsupported but Feet Supported on Floor or Stool Able to sit safely and securely 2 minutes    Stand to Sit Sits safely with minimal use of hands    Transfers Able to transfer safely, minor use of hands    Standing Unsupported with Eyes Closed Able to stand 10 seconds safely    Standing Unsupported with Feet Together Able to place feet together independently and stand 1 minute safely    From Standing, Reach Forward with Outstretched Arm Can reach confidently >25 cm (10")    From Standing Position, Pick up Object from Floor Able to pick up shoe safely and easily    From Standing Position, Turn to Look Behind Over each Shoulder Looks behind from both sides and weight shifts well    Turn 360 Degrees Able to turn 360 degrees safely in 4 seconds or less    Standing Unsupported, Alternately Place Feet on Step/Stool Able to stand independently and safely and complete 8 steps in 20  seconds    Standing Unsupported, One Foot in [place] to place foot tandem independently and hold 30 seconds    Standing on One Leg Able to lift leg independently and hold > 10 seconds    Total Score 56                OBJECTIVE FINDINGS   Gait With SPC: decreased gait velocity and stride length   With no AD: slowed gait velocity, good heel to toe progression with excessive external rotation of each lower limb through mid to late swing phase     Palpation No tenderness to palpation    Strength R/L 5/5 Hip flexion 5/5 Hip external rotation 5/5 Hip internal rotation Not tested/not tested Hip abduction 5/5 Hip adduction 5/5 Knee extension 5/5 Knee flexion 5/5 Ankle Dorsiflexion 5/5 Ankle Plantarflexion 5/5 Ankle Inversion 5/5 Ankle Eversion *indicates  pain   AROM   Knee R/L Flexion: 103/106  Extension: -15/-15 *indicates pain     PROM Ankle Dorsiflexion: R WNL, L WNL *Indicates Pain      Functional Outcome Measures   BERG: 56 Timed up and Go: 11.9 sec ABC Scale: 95.6%     TREATMENT    Therapeutic Activities  Re-assessment performed (see above and updated Goal section below)    Therapeutic Exercise - for improved soft tissue flexibility and extensibility as needed for ROM, improved strength as needed to improve performance of CKC activities/functional movements     NuStep x 5 minutes, Level 6; for knee AAROM and warm-up for improved soft tissue mobility/extensibility and muscle performance    Sit to stand with raised table; 2x10; with 8-lb Medball Goblet hold  [perform with 10-lb next visit)  Gait in hallway with no AD; x 210 feet, with supervision of PT     *not today* Straight leg raise; 2x12 each LE, supine, with 5-lb ankle weights  Glute Bridge; 2x10, with Grn Physioball Ambulate lap around gym x1 with FWW; CGA Quad set; 2x10, 5 sec, bilateral     Neuromuscular Re-education - for postural stability/postural control, gluteal musculature activation and exercises to promote LE kinetic chain stability, dynamic gait stability   *not today* In // bars: Long Airex Tandem walk; 5x D/B, forward only Single Airex pad, balance bubble, 6-inch hurdle; consecutive negotiation of 3 obstacles; 4x D/B Forward step-up with opposite knee driver, 6-inch step (staircase in middle of gym); 2x10 each LE   3-way Hip; 4-lb ankle weight bilat LE; x8 ea dir on each LE Gait with SPC; around perimeter of building - including gait over sidewalk, curb cut, grass, decline, curb step up and down x 6 min 3 consecutive Airex pads on floor, step on/over each; 4x D/B Hurdle step over 3 6-inch hurdles; 4x D/B Toe tapping to two stacked 6-inch steps with stance on Airex; 2x10, alternating Sidestepping; 4x D/B, no UE support with  BlueTband around thighs superior to patellae  Toe tapping to 12-inch step (two steps stacked); 1x10 alternating [report of R knee pain toward end of set] [place] 0-30 deg; 2x10          ASSESSMENT Patient has met all PT goals established at outset of PT and demonstrates minimal fall risk per TUG and BERG scores. She has maintained her HEP well throughout her plan of care. She has good LE strength and fair knee ROM (mild flexion loss). She demonstrates substantially improved ability to perform transferring (sit to stand and stand-pivot transfer). She demonstrates excellent static postural control per Sharpened Romberg  stance and unipedal stance during BERG performance. Pt has minimal to no pain at this time and is approaching readiness for discharge. Updated goals to work toward patient goal of weaning from AD use for community-level distance including negotiating of uneven terrain. Patient will benefit from continued skilled PT intervention to fully wean from use of AD and attain full recovery of function.       PT Short Term Goals - 05/25/21 1347       PT SHORT TERM GOAL #1   Title Pt will be independent with HEP in order to decrease knee pain and increase strength in order to improve pain-free function at home and work.    Baseline 03/18/21: HEP to be initiated next visit.   04/22/21: Patient is compliant with home exercise program and verbalizes and demonstrates sound understanding of exercises.  05/25/21: Pt has excellent compliance and understanding of HEP.    Time 3    Period Weeks    Status Achieved    Target Date 04/08/21      PT SHORT TERM GOAL #2   Title Patient will perform sit to stand from standard chair without significant UE support and no increase in baseline pain indicative of improved functional LE strength as needed to prevent fall during episode of LOB and improved ability to transfer    Baseline 03/18/21: Significant difficulty and heavy UE suppot with weight shift to LLE  for transferring.   04/22/21: Moderate UE support required for sit to stand from standard height chair; she can perform sit to stand without UE support from raised surface.   05/25/21: Pt able to perform sit to stand from [place] chair without LOB and no reproduction of knee pain.    Time 6    Period Weeks    Status Achieved    Target Date 05/06/21               PT Long Term Goals - 05/25/21 1258       PT LONG TERM GOAL #1   Title Patient will demonstrate improved function as evidenced by a score of 57 on FOTO measure for full participation in activities at home and in the community.    Baseline 03/18/21: 47.     04/22/21: 67.   05/25/21:76.    Time 10    Period Weeks    Status Achieved    Target Date 05/27/21      PT LONG TERM GOAL #2   Title Pt will increase strength of tested lower extremity musculature to at least 4+ MMT grade in order to demonstrate improvement in strength and function required for prolonged weightbearing activity, stair/step and obstacle negotiation    Baseline 03/18/21: Hip/knee musculature 3+ to 4- in RLE, grossly 4/5 in LLE.  04/22/21: Met for all LE muscle groups tested.    05/25/21: Met for all LE muscle groups tested.    Time 10    Period Weeks    Status Achieved    Target Date 05/27/21      PT LONG TERM GOAL #3   Title Pt will decrease worst pain as reported on NPRS by at least 3 points in order to demonstrate clinically significant reduction in pain.    Baseline 03/18/21: Pain up to 10/10 at worst.    04/22/21: Pain up to 3/10 over the previous week.    05/25/21: 2/10 at worst over the last week.    Time 10    Period Weeks    Status Achieved  Target Date 05/27/21      PT LONG TERM GOAL #4   Title Patient will ambulate up to 150 feet in hallway with least-restrictive assistive device with no LOB or lower extremity buckling and significant increase in baseline pain by 2 points or greater on NPRS - indicative of improved household-level and  short-distance mobility    Baseline 03/18/21: Significant limitation with prolonged standing activity, gait deficits and notable pain with ambulation c front-wheeled walker.   04/22/21: Ambulated 80 feet with SPC only with mild decreased stance time RLE and decreased gait velocity.    05/25/21: Ambulated x 210 feet in hallway with no AD, no buckling or LOB    Time 10    Period Weeks    Status Achieved    Target Date 05/27/21      PT LONG TERM GOAL #5   Title Patient will improve BERG by 3 points or greater indicative of decreased risk of falling and improved function for performing functional mobility ADLs and transferring    Baseline 03/18/21: BERG to be performed on visit # 2.   03/23/21: BERG 41/56.   04/27/21: BERG 52/56.   05/25/21: BERG 56/56    Time 10    Period Weeks    Status Achieved    Target Date 05/27/21      Additional Long Term Goals   Additional Long Term Goals Yes      PT LONG TERM GOAL #6   Title Patient will ambulate with no AD for community-level distance (300 ft or greater) around perimeter of building including traversing of uneven surfaces, varying surface textures (cement, asphalt, grass), and negotiating incline/decline without reproduction of pain, LE buckling, or LOB    Baseline 05/25/21: Pt able to ambulate up to 210 feet on even ground with no AD and no buckling or LOB.    Time 12    Period Weeks    Status New    Target Date 06/10/21                   Plan - 05/25/21 1400     Clinical Impression Statement Patient has met all PT goals established at outset of PT and demonstrates minimal fall risk per TUG and BERG scores. She has maintained her HEP well throughout her plan of care. She has good LE strength and fair knee ROM (mild flexion loss). She demonstrates substantially improved ability to perform transferring (sit to stand and stand-pivot transfer). She demonstrates excellent static postural control per Sharpened Romberg stance and unipedal stance  during BERG performance. Pt has minimal to no pain at this time and is approaching readiness for discharge. Updated goals to work toward patient goal of weaning from AD use for community-level distance including negotiating of uneven terrain. Patient will benefit from continued skilled PT intervention to fully wean from use of AD and attain full recovery of function.    Personal Factors and Comorbidities Comorbidity 3+;Time since onset of injury/illness/exacerbation;Past/Current Experience;Behavior Pattern    Comorbidities Anxiety, depression, Hx of schizoaffective disorder, thyroid disease, obesity, chronic bilateral knee pain, GERD    Examination-Activity Limitations Bed Mobility;Dressing;Stand;Stairs;Bend;Sit;Transfers;Lift;Locomotion Level;Squat    Examination-Participation Restrictions Cleaning    Stability/Clinical Decision Making Unstable/Unpredictable    Rehab Potential Fair    PT Frequency 2x / week    PT Duration Other (comment)   10 weeks   PT Treatment/Interventions Cryotherapy;Electrical Stimulation;ADLs/Self Care Home Management;Gait training;Stair training;Functional mobility training;Therapeutic activities;Therapeutic exercise;Balance training;Neuromuscular re-education;Patient/family education;Manual techniques;Passive range of motion  PT Next Visit Plan Continued with gait training with LRAD, low-impact lower extremity isotonics with increasing emphasis on closed-chain strengthening. Postural stability training and fall risk management.    PT Home Exercise Plan Access Code [email or web address]    Consulted and Agree with Plan of Care Patient             Patient will benefit from skilled therapeutic intervention in order to improve the following deficits and impairments:  Abnormal gait, Decreased activity tolerance, Decreased range of motion, Decreased strength, Hypomobility, Pain, Difficulty walking, Decreased balance  Visit Diagnosis: Chronic pain of both knees  Difficulty in  walking, not elsewhere classified  Muscle weakness (generalized)  Personal history of fall     Problem List Patient Active Problem List   Diagnosis Date Noted   Migraine without aura and without status migrainosus, not intractable 03/05/2021   Mild intermittent asthma without complication 30/01/2329   Gastroesophageal reflux disease 12/01/2020   Hypoglycemia 12/01/2020   History of hypoglycemia 04/26/2020   History of hyperprolactinemia 04/26/2020   Rheumatoid factor positive 12/09/2019   SS-A antibody positive 10/28/2019   Chronic pain of both knees 10/28/2019   Subclinical hypothyroidism 08/02/2019   Hyperprolactinemia ([place]) 08/02/2019   Schizoaffective disorder, depressive type ([place]) 02/02/2018   Suicide attempt ([place]) 01/16/2018   Dysfunctional uterine bleeding 01/06/2018   Borderline personality disorder ([place]) 01/04/2018   PTSD (post-traumatic stress disorder) 01/03/2018   ASCUS of cervix with negative high risk HPV 08/28/2017   Malingering 05/06/2016   Breast tumor 03/05/2016   Schizoaffective disorder, bipolar type ([place]) 11/06/2014   Hypertension 09/29/2014   Valentina Gu, PT, DPT #[id number]  Eilleen Kempf, PT 05/25/2021, 2:00 PM  [place] [facility] [address] [place], [place], [address] Phone: [phone]   Fax:  [phone]  Name: Aarianna Hoadley MRN: [id number] Date of Birth: 05-21-1990

## 2021-05-27 ENCOUNTER — Other Ambulatory Visit: Payer: Self-pay

## 2021-05-27 ENCOUNTER — Encounter: Payer: Self-pay | Admitting: Physical Therapy

## 2021-05-27 ENCOUNTER — Ambulatory Visit: Payer: Medicare Other | Admitting: Physical Therapy

## 2021-05-27 DIAGNOSIS — M25562 Pain in left knee: Secondary | ICD-10-CM

## 2021-05-27 DIAGNOSIS — M25561 Pain in right knee: Secondary | ICD-10-CM | POA: Diagnosis not present

## 2021-05-27 DIAGNOSIS — R262 Difficulty in walking, not elsewhere classified: Secondary | ICD-10-CM

## 2021-05-27 DIAGNOSIS — G8929 Other chronic pain: Secondary | ICD-10-CM

## 2021-05-27 DIAGNOSIS — M6281 Muscle weakness (generalized): Secondary | ICD-10-CM

## 2021-05-27 DIAGNOSIS — Z9181 History of falling: Secondary | ICD-10-CM

## 2021-05-27 NOTE — Therapy (Signed)
[facility] [address] [place], [place], [address] Phone: [phone]   Fax:  [phone]  Physical Therapy Treatment  Patient Details  Name: Carlea Badour MRN: [id number] Date of Birth: Jan 02, 1991 Referring Provider (PT): Rosalia Hammers, MD   Encounter Date: 05/27/2021   PT End of Session - 05/27/21 1247     Visit Number 19    Number of Visits 23    Date for PT Re-Evaluation 05/27/21    Authorization Type Medicare/Medicaid, VL based on medical necessity, no auth    Authorization Time Period Cert 4/66/59-9/35/70, prog note 05/25/21    Progress Note Due on Visit 28    PT Start Time 1241    PT Stop Time 1324    PT Time Calculation (min) 43 min    Equipment Utilized During Treatment Gait belt;Other (comment)   front-wheel walker (patient has her own walker), SPC   Activity Tolerance Patient tolerated treatment well    Behavior During Therapy [facility] for tasks assessed/performed             Past Medical History:  Diagnosis Date   Allergy    Anxiety    Arthritis    Asthma    Depression    Foreign body aspiration    GERD (gastroesophageal reflux disease)    Hallucinations 09/30/2014   Sizoaffective   Hyperlipidemia    Hypertension    Intentional ingestion of batteries 04/21/2016    2 AAA batteries and 1 thumb tack; intent to hurt herself/notes 17/11/9388   Self-inflicted injury 30/01/2329   Suicidal ideation 01/02/2018   Tardive dyskinesia 10/2014   recent onset   Thyroid disease     Past Surgical History:  Procedure Laterality Date   ABDOMINAL SURGERY     "years ago" to remove foreign objects   APPENDECTOMY     BREAST EXCISIONAL BIOPSY Right 09/03/2007   surgical bx removed fibroadeoma   BREAST LUMPECTOMY Right    COLONOSCOPY WITH PROPOFOL N/A 09/10/2015   Procedure: COLONOSCOPY WITH PROPOFOL;  Surgeon: [name], MD;  Location: [facility] ENDOSCOPY;  Service: Endoscopy;  Laterality: N/A;    ESOPHAGOGASTRODUODENOSCOPY N/A 11/28/2014   Procedure: ESOPHAGOGASTRODUODENOSCOPY (EGD);  Surgeon: [name], MD;  Location: [facility] ENDOSCOPY;  Service: Endoscopy;  Laterality: N/A;   ESOPHAGOGASTRODUODENOSCOPY N/A 02/21/2016   Procedure: ESOPHAGOGASTRODUODENOSCOPY (EGD);  Surgeon: Mauri Pole, MD;  Location: [facility] ENDOSCOPY;  Service: Endoscopy;  Laterality: N/A;   ESOPHAGOGASTRODUODENOSCOPY N/A 04/21/2016   Procedure: ESOPHAGOGASTRODUODENOSCOPY (EGD);  Surgeon: Gatha Mayer, MD;  Location: [facility] ENDOSCOPY;  Service: Endoscopy;  Laterality: N/A;   ESOPHAGOGASTRODUODENOSCOPY N/A 05/06/2016   Procedure: ESOPHAGOGASTRODUODENOSCOPY (EGD);  Surgeon: Jonathon Bellows, MD;  Location: [facility] ENDOSCOPY;  Service: Gastroenterology;  Laterality: N/A;   ESOPHAGOGASTRODUODENOSCOPY N/A 06/13/2016   Procedure: ESOPHAGOGASTRODUODENOSCOPY (EGD);  Surgeon: [name], MD;  Location: [facility] ENDOSCOPY;  Service: Endoscopy;  Laterality: N/A;   ESOPHAGOGASTRODUODENOSCOPY (EGD) WITH PROPOFOL N/A 02/29/2016   Procedure: ESOPHAGOGASTRODUODENOSCOPY (EGD) WITH PROPOFOL;  Surgeon: [name], MD;  Location: [facility] ENDOSCOPY;  Service: Endoscopy;  Laterality: N/A;   ESOPHAGOGASTRODUODENOSCOPY (EGD) WITH PROPOFOL N/A 01/15/2018   Procedure: ESOPHAGOGASTRODUODENOSCOPY (EGD) WITH PROPOFOL;  Surgeon: Daneil Dolin, MD;  Location: [facility] ENDO SUITE;  Service: Endoscopy;  Laterality: N/A;  retrieval forein body   LAPAROTOMY N/A 09/12/2015   Procedure: EXPLORATORY LAPAROTOMY;  Surgeon: Florene Glen, MD;  Location: [facility] ORS;  Service: General;  Laterality: N/A;   SIGMOIDOSCOPY N/A 09/12/2015   Procedure: Lonell Face;  Surgeon: Florene Glen,  MD;  Location: [facility] ORS;  Service: General;  Laterality: N/A;   WISDOM TOOTH EXTRACTION      There were no vitals filed for this visit.   Subjective Assessment - 05/27/21 1246     Subjective Patient reports feeling well at arrival to PT. Patient reports tolerating last session well  including re-evaluation. Patient reports she is just carrying her SPC at this time and initiating gradual wean. She denies new complaints at arrival.    Pertinent History Patient is a 31 year old female with involved psych medical history with primary complaint of chronic bilateral knee pain with gait instability and Hx of frequent falls. Patient has had multiple conservative treatments for knee pain including anti-inflammatories, injections. Hx of R ACL sprain and post-injury rehab. She states that recently injections helped for about 3 weeks. Patient reports notable "aching" behind her kneecap with prolonged sitting (pre-patellar pain). Patient reports ongoing pain and dysfunction since 2020. Patient has frequent falls. Patient reports about 10 falls over the last 6 months. She reports direct impact onto her knees often with her falls. Patient had isolated incident of numbness due to notable swelling after fall, but has not felt numbness or paresthesias recently. When questioned about neuro history, patient reports "changes in white matter" and associated migraines (present since [age] years old, have remained stable since then). Pt lives with her fiance. Patient has 3 steps to get into her home and pt uses railing. Patient reports she has to negotiate grass on level ground, but she can negotiate this well. Patient reports falls can happen with prolonged standing, walking, and functional reaching while standing. She states her R knee intermittently "gives out." Pt has continuous aching in her knees.    Limitations Standing;Sitting;House hold activities;Walking    Diagnostic tests MRI and knee radiograph - see below    Patient Stated Goals Improved ability to walk, less falls, improved knee pain               TREATMENT    Therapeutic Exercise - for improved soft tissue flexibility and extensibility as needed for ROM, improved strength as needed to improve performance of CKC activities/functional  movements     NuStep x 5 minutes, Level 6; for knee AAROM and warm-up for improved soft tissue mobility/extensibility and muscle performance    Glute Bridge; 2x10, with Grn Physioball  Sit to stand with raised table; 2x8; with 8-lb Medball Goblet hold  [perform with 10-lb next visit)      *not today* Gait in hallway with no AD; x 210 feet, with supervision of PT Straight leg raise; 2x12 each LE, supine, with 5-lb  Ambulate lap around gym x1 with FWW; CGA Quad set; 2x10, 5 sec, bilateral     Neuromuscular Re-education - for postural stability/postural control, gluteal musculature activation and exercises to promote LE kinetic chain stability, dynamic gait stability    In // bars: Long Airex Tandem walk; 5x D/B, forward only Single Airex pad, balance bubble, 6-inch hurdle; consecutive negotiation of 3 obstacles; 4x D/B Forward step-up with opposite knee driver, 6-inch step (staircase in middle of gym); 2x10 each LE 3-way Hip; 5-lb ankle weight bilat LE; x8 ea dir on each LE Consecutive obstacle step-over: two 6-inch hurdles, balance bubble, and Airex pad; 4x D/B   Gait with no AD; around perimeter of building - including gait over sidewalk, curb cut, grass, decline, curb step up and down x 5 min    *not today* 3 consecutive Airex pads on floor, step on/over  each; 4x D/B Toe tapping to two stacked 6-inch steps with stance on Airex; 2x10, alternating Sidestepping; 4x D/B, no UE support with BlueTband around thighs superior to patellae  Toe tapping to 12-inch step (two steps stacked); 1x10 alternating [report of R knee pain toward end of set] [place]quat 0-30 deg; 2x10           ASSESSMENT Patient demonstrates safe gait with no AD over even and uneven terrain, with change in surface elevation, and with incline/decline without RLE buckling or LOB. Pt is appropriate for continued gradual AD wean and pt may discontinue use of SPC for household mobility. She is making excellent  progress and will likely be ready to continue with independent home exercise program following completion of her current POC. Updated goals to work toward patient goal of weaning from AD use for community-level distance including negotiating of uneven terrain. Patient will benefit from continued skilled PT intervention to fully wean from use of AD and attain full recovery of function.          PT Short Term Goals - 05/25/21 1347       PT SHORT TERM GOAL #1   Title Pt will be independent with HEP in order to decrease knee pain and increase strength in order to improve pain-free function at home and work.    Baseline 03/18/21: HEP to be initiated next visit.   04/22/21: Patient is compliant with home exercise program and verbalizes and demonstrates sound understanding of exercises.  05/25/21: Pt has excellent compliance and understanding of HEP.    Time 3    Period Weeks    Status Achieved    Target Date 04/08/21      PT SHORT TERM GOAL #2   Title Patient will perform sit to stand from standard chair without significant UE support and no increase in baseline pain indicative of improved functional LE strength as needed to prevent fall during episode of LOB and improved ability to transfer    Baseline 03/18/21: Significant difficulty and heavy UE suppot with weight shift to LLE for transferring.   04/22/21: Moderate UE support required for sit to stand from standard height chair; she can perform sit to stand without UE support from raised surface.   05/25/21: Pt able to perform sit to stand from [place] chair without LOB and no reproduction of knee pain.    Time 6    Period Weeks    Status Achieved    Target Date 05/06/21               PT Long Term Goals - 05/25/21 1258       PT LONG TERM GOAL #1   Title Patient will demonstrate improved function as evidenced by a score of 57 on FOTO measure for full participation in activities at home and in the community.    Baseline 03/18/21: 47.      04/22/21: 67.   05/25/21:76.    Time 10    Period Weeks    Status Achieved    Target Date 05/27/21      PT LONG TERM GOAL #2   Title Pt will increase strength of tested lower extremity musculature to at least 4+ MMT grade in order to demonstrate improvement in strength and function required for prolonged weightbearing activity, stair/step and obstacle negotiation    Baseline 03/18/21: Hip/knee musculature 3+ to 4- in RLE, grossly 4/5 in LLE.  04/22/21: Met for all LE muscle groups tested.    05/25/21: Met  for all LE muscle groups tested.    Time 10    Period Weeks    Status Achieved    Target Date 05/27/21      PT LONG TERM GOAL #3   Title Pt will decrease worst pain as reported on NPRS by at least 3 points in order to demonstrate clinically significant reduction in pain.    Baseline 03/18/21: Pain up to 10/10 at worst.    04/22/21: Pain up to 3/10 over the previous week.    05/25/21: 2/10 at worst over the last week.    Time 10    Period Weeks    Status Achieved    Target Date 05/27/21      PT LONG TERM GOAL #4   Title Patient will ambulate up to 150 feet in hallway with least-restrictive assistive device with no LOB or lower extremity buckling and significant increase in baseline pain by 2 points or greater on NPRS - indicative of improved household-level and short-distance mobility    Baseline 03/18/21: Significant limitation with prolonged standing activity, gait deficits and notable pain with ambulation c front-wheeled walker.   04/22/21: Ambulated 80 feet with SPC only with mild decreased stance time RLE and decreased gait velocity.    05/25/21: Ambulated x 210 feet in hallway with no AD, no buckling or LOB    Time 10    Period Weeks    Status Achieved    Target Date 05/27/21      PT LONG TERM GOAL #5   Title Patient will improve BERG by 3 points or greater indicative of decreased risk of falling and improved function for performing functional mobility ADLs and transferring    Baseline  03/18/21: BERG to be performed on visit # 2.   03/23/21: BERG 41/56.   04/27/21: BERG 52/56.   05/25/21: BERG 56/56    Time 10    Period Weeks    Status Achieved    Target Date 05/27/21      Additional Long Term Goals   Additional Long Term Goals Yes      PT LONG TERM GOAL #6   Title Patient will ambulate with no AD for community-level distance (300 ft or greater) around perimeter of building including traversing of uneven surfaces, varying surface textures (cement, asphalt, grass), and negotiating incline/decline without reproduction of pain, LE buckling, or LOB    Baseline 05/25/21: Pt able to ambulate up to 210 feet on even ground with no AD and no buckling or LOB.    Time 12    Period Weeks    Status New    Target Date 06/10/21                   Plan - 05/28/21 1022     Clinical Impression Statement Patient demonstrates safe gait with no AD over even and uneven terrain, with change in surface elevation, and with incline/decline without RLE buckling or LOB. Pt is appropriate for continued gradual AD wean and pt may discontinue use of SPC for household mobility. She is making excellent progress and will likely be ready to continue with independent home exercise program following completion of her current POC. Updated goals to work toward patient goal of weaning from AD use for community-level distance including negotiating of uneven terrain. Patient will benefit from continued skilled PT intervention to fully wean from use of AD and attain full recovery of function.    Personal Factors and Comorbidities Comorbidity 3+;Time since onset of injury/illness/exacerbation;Past/Current  Experience;Behavior Pattern    Comorbidities Anxiety, depression, Hx of schizoaffective disorder, thyroid disease, obesity, chronic bilateral knee pain, GERD    Examination-Activity Limitations Bed Mobility;Dressing;Stand;Stairs;Bend;Sit;Transfers;Lift;Locomotion Level;Squat    Examination-Participation  Restrictions Cleaning    Stability/Clinical Decision Making Unstable/Unpredictable    Rehab Potential Fair    PT Frequency 2x / week    PT Duration Other (comment)   10 weeks   PT Treatment/Interventions Cryotherapy;Electrical Stimulation;ADLs/Self Care Home Management;Gait training;Stair training;Functional mobility training;Therapeutic activities;Therapeutic exercise;Balance training;Neuromuscular re-education;Patient/family education;Manual techniques;Passive range of motion    PT Next Visit Plan Continued with gait training with LRAD, low-impact lower extremity isotonics with increasing emphasis on closed-chain strengthening. Postural stability training and fall risk management.    PT Home Exercise Plan Access Code [email or web address]    Consulted and Agree with Plan of Care Patient             Patient will benefit from skilled therapeutic intervention in order to improve the following deficits and impairments:  Abnormal gait, Decreased activity tolerance, Decreased range of motion, Decreased strength, Hypomobility, Pain, Difficulty walking, Decreased balance  Visit Diagnosis: Chronic pain of both knees  Difficulty in walking, not elsewhere classified  Muscle weakness (generalized)  Personal history of fall     Problem List Patient Active Problem List   Diagnosis Date Noted   Migraine without aura and without status migrainosus, not intractable 03/05/2021   Mild intermittent asthma without complication 80/16/5537   Gastroesophageal reflux disease 12/01/2020   Hypoglycemia 12/01/2020   History of hypoglycemia 04/26/2020   History of hyperprolactinemia 04/26/2020   Rheumatoid factor positive 12/09/2019   SS-A antibody positive 10/28/2019   Chronic pain of both knees 10/28/2019   Subclinical hypothyroidism 08/02/2019   Hyperprolactinemia ([place]) 08/02/2019   Schizoaffective disorder, depressive type ([place]) 02/02/2018   Suicide attempt ([place]) 01/16/2018   Dysfunctional uterine bleeding  01/06/2018   Borderline personality disorder ([place]) 01/04/2018   PTSD (post-traumatic stress disorder) 01/03/2018   ASCUS of cervix with negative high risk HPV 08/28/2017   Malingering 05/06/2016   Breast tumor 03/05/2016   Schizoaffective disorder, bipolar type ([place]) 11/06/2014   Hypertension 09/29/2014   Valentina Gu, PT, DPT #[id number]  Eilleen Kempf, PT 05/28/2021, 10:22 AM  [facility] [address] [place], [place], [address] Phone: [phone]   Fax:  [phone]  Name: Lester Platas MRN: [id number] Date of Birth: 02/22/1991

## 2021-06-01 ENCOUNTER — Ambulatory Visit: Payer: Medicare Other | Admitting: Physical Therapy

## 2021-06-01 ENCOUNTER — Encounter: Payer: Self-pay | Admitting: Physical Therapy

## 2021-06-01 ENCOUNTER — Other Ambulatory Visit: Payer: Self-pay

## 2021-06-01 DIAGNOSIS — G8929 Other chronic pain: Secondary | ICD-10-CM

## 2021-06-01 DIAGNOSIS — M25561 Pain in right knee: Secondary | ICD-10-CM | POA: Diagnosis not present

## 2021-06-01 DIAGNOSIS — R262 Difficulty in walking, not elsewhere classified: Secondary | ICD-10-CM

## 2021-06-01 DIAGNOSIS — Z9181 History of falling: Secondary | ICD-10-CM

## 2021-06-01 DIAGNOSIS — M6281 Muscle weakness (generalized): Secondary | ICD-10-CM

## 2021-06-01 NOTE — Therapy (Signed)
[facility] [address]. [place], [place], [address] Phone: [phone]   Fax:  [phone]  Physical Therapy Treatment  Patient Details  [name] MRN: [id number] Date of Birth: 18-Dec-1990 Referring Provider (PT): Rosalia Hammers, MD   Encounter Date: 06/01/2021   PT End of Session - 06/02/21 1244     Visit Number 20    Number of Visits 23    Date for PT Re-Evaluation 05/27/21    Authorization Type Medicare/Medicaid, VL based on medical necessity, no auth    Authorization Time Period Cert 2/95/53-9/71/41, prog note 05/25/21    Progress Note Due on Visit 28    PT Start Time 1301    PT Stop Time 1344    PT Time Calculation (min) 43 min    Equipment Utilized During Treatment --   front-wheel walker (patient has her own walker), SPC   Activity Tolerance Patient tolerated treatment well    Behavior During Therapy [facility] for tasks assessed/performed             Past Medical History:  Diagnosis Date   Allergy    Anxiety    Arthritis    Asthma    Depression    Foreign body aspiration    GERD (gastroesophageal reflux disease)    Hallucinations 09/30/2014   Sizoaffective   Hyperlipidemia    Hypertension    Intentional ingestion of batteries 04/21/2016    2 AAA batteries and 1 thumb tack; intent to hurt herself/notes 10/21/7614   Self-inflicted injury 07/60/6678   Suicidal ideation 01/02/2018   Tardive dyskinesia 10/2014   recent onset   Thyroid disease     Past Surgical History:  Procedure Laterality Date   ABDOMINAL SURGERY     "years ago" to remove foreign objects   APPENDECTOMY     BREAST EXCISIONAL BIOPSY Right 09/03/2007   surgical bx removed fibroadeoma   BREAST LUMPECTOMY Right    COLONOSCOPY WITH PROPOFOL N/A 09/10/2015   Procedure: COLONOSCOPY WITH PROPOFOL;  Surgeon: [name], MD;  Location: [facility] ENDOSCOPY;  Service: Endoscopy;  Laterality: N/A;   ESOPHAGOGASTRODUODENOSCOPY N/A  11/28/2014   Procedure: ESOPHAGOGASTRODUODENOSCOPY (EGD);  Surgeon: [name], MD;  Location: [facility] ENDOSCOPY;  Service: Endoscopy;  Laterality: N/A;   ESOPHAGOGASTRODUODENOSCOPY N/A 02/21/2016   Procedure: ESOPHAGOGASTRODUODENOSCOPY (EGD);  Surgeon: Mauri Pole, MD;  Location: [facility] ENDOSCOPY;  Service: Endoscopy;  Laterality: N/A;   ESOPHAGOGASTRODUODENOSCOPY N/A 04/21/2016   Procedure: ESOPHAGOGASTRODUODENOSCOPY (EGD);  Surgeon: Gatha Mayer, MD;  Location: [facility] ENDOSCOPY;  Service: Endoscopy;  Laterality: N/A;   ESOPHAGOGASTRODUODENOSCOPY N/A 05/06/2016   Procedure: ESOPHAGOGASTRODUODENOSCOPY (EGD);  Surgeon: Jonathon Bellows, MD;  Location: [facility] ENDOSCOPY;  Service: Gastroenterology;  Laterality: N/A;   ESOPHAGOGASTRODUODENOSCOPY N/A 06/13/2016   Procedure: ESOPHAGOGASTRODUODENOSCOPY (EGD);  Surgeon: [name], MD;  Location: [facility] ENDOSCOPY;  Service: Endoscopy;  Laterality: N/A;   ESOPHAGOGASTRODUODENOSCOPY (EGD) WITH PROPOFOL N/A 02/29/2016   Procedure: ESOPHAGOGASTRODUODENOSCOPY (EGD) WITH PROPOFOL;  Surgeon: [name], MD;  Location: [facility] ENDOSCOPY;  Service: Endoscopy;  Laterality: N/A;   ESOPHAGOGASTRODUODENOSCOPY (EGD) WITH PROPOFOL N/A 01/15/2018   Procedure: ESOPHAGOGASTRODUODENOSCOPY (EGD) WITH PROPOFOL;  Surgeon: Daneil Dolin, MD;  Location: [facility] ENDO SUITE;  Service: Endoscopy;  Laterality: N/A;  retrieval forein body   LAPAROTOMY N/A 09/12/2015   Procedure: EXPLORATORY LAPAROTOMY;  Surgeon: Florene Glen, MD;  Location: [facility] ORS;  Service: General;  Laterality: N/A;   SIGMOIDOSCOPY N/A 09/12/2015   Procedure: Lonell Face;  Surgeon: Florene Glen, MD;  Location: [facility] ORS;  Service: General;  Laterality: N/A;   WISDOM TOOTH EXTRACTION      There were no vitals filed for this visit.   Subjective Assessment - 06/01/21 1307     Subjective Patient reports no notable pain at arrival to PT. Patient reports no typical "achiness" that she previously had with cold  inclement weather. Patient reports compliance with her HEP and further wean from her AD.    Pertinent History Patient is a 31 year old female with involved psych medical history with primary complaint of chronic bilateral knee pain with gait instability and Hx of frequent falls. Patient has had multiple conservative treatments for knee pain including anti-inflammatories, injections. Hx of R ACL sprain and post-injury rehab. She states that recently injections helped for about 3 weeks. Patient reports notable "aching" behind her kneecap with prolonged sitting (pre-patellar pain). Patient reports ongoing pain and dysfunction since 2020. Patient has frequent falls. Patient reports about 10 falls over the last 6 months. She reports direct impact onto her knees often with her falls. Patient had isolated incident of numbness due to notable swelling after fall, but has not felt numbness or paresthesias recently. When questioned about neuro history, patient reports "changes in white matter" and associated migraines (present since [age] years old, have remained stable since then). Pt lives with her fiance. Patient has 3 steps to get into her home and pt uses railing. Patient reports she has to negotiate grass on level ground, but she can negotiate this well. Patient reports falls can happen with prolonged standing, walking, and functional reaching while standing. She states her R knee intermittently "gives out." Pt has continuous aching in her knees.    Limitations Standing;Sitting;House hold activities;Walking    Diagnostic tests MRI and knee radiograph - see below    Patient Stated Goals Improved ability to walk, less falls, improved knee pain               TREATMENT     Therapeutic Exercise - for improved soft tissue flexibility and extensibility as needed for ROM, improved strength as needed to improve performance of CKC activities/functional movements     NuStep x 5 minutes, Level 6-7; for knee AAROM  and warm-up for improved soft tissue mobility/extensibility and muscle performance    Glute Bridge; 2x10, with Grn Physioball   Sit to stand with raised table; 2x12; with 10-lb Medball Goblet hold         *not today* Gait in hallway with no AD; x 210 feet, with supervision of PT Straight leg raise; 2x12 each LE, supine, with 5-lb  Ambulate lap around gym x1 with FWW; CGA Quad set; 2x10, 5 sec, bilateral     Neuromuscular Re-education - for postural stability/postural control, gluteal musculature activation and exercises to promote LE kinetic chain stability, dynamic gait stability     In // bars: Long Airex Tandem walk; 5x D/B, forward only Single Airex pad, balance bubble, two 6-inch hurdles; consecutive negotiation of 3 obstacles; 3x D/B Forward step-up with opposite knee driver, 6-inch step (staircase in middle of gym); 2x10 each LE 3-way Hip; 5-lb ankle weight bilat LE; x8 ea dir on each LE Consecutive obstacle step-over: two 6-inch hurdles, balance bubble, and Airex pad; 4x D/B   3-way slider, standing; x5 ea dir; anterior/lateral/posterior       *not today* Gait with no AD; around perimeter of building - including gait over sidewalk, curb cut, grass, decline, curb step up and down x 5 min 3 consecutive  Airex pads on floor, step on/over each; 4x D/B Toe tapping to two stacked 6-inch steps with stance on Airex; 2x10, alternating Sidestepping; 4x D/B, no UE support with BlueTband around thighs superior to patellae  Toe tapping to 12-inch step (two steps stacked); 1x10 alternating [report of R knee pain toward end of set] Minisquat 0-30 deg; 2x10           ASSESSMENT Patient has no significant pain at this time and is able to perform sit to stand from 90 deg knee flexion without anterior knee pain. She tolerates progression of closed-chain loading and dynamic balance training without pain or LOB. She demonstrates safe negotiation of clinic without use of AD at this time.  Patient is making excellent progress and is approaching readiness for discharge. Patient will benefit from continued skilled PT intervention to fully wean from use of AD and attain full recovery of function.           PT Short Term Goals - 05/25/21 1347       PT SHORT TERM GOAL #1   Title Pt will be independent with HEP in order to decrease knee pain and increase strength in order to improve pain-free function at home and work.    Baseline 03/18/21: HEP to be initiated next visit.   04/22/21: Patient is compliant with home exercise program and verbalizes and demonstrates sound understanding of exercises.  05/25/21: Pt has excellent compliance and understanding of HEP.    Time 3    Period Weeks    Status Achieved    Target Date 04/08/21      PT SHORT TERM GOAL #2   Title Patient will perform sit to stand from standard chair without significant UE support and no increase in baseline pain indicative of improved functional LE strength as needed to prevent fall during episode of LOB and improved ability to transfer    Baseline 03/18/21: Significant difficulty and heavy UE suppot with weight shift to LLE for transferring.   04/22/21: Moderate UE support required for sit to stand from standard height chair; she can perform sit to stand without UE support from raised surface.   05/25/21: Pt able to perform sit to stand from [place] chair without LOB and no reproduction of knee pain.    Time 6    Period Weeks    Status Achieved    Target Date 05/06/21               PT Long Term Goals - 05/25/21 1258       PT LONG TERM GOAL #1   Title Patient will demonstrate improved function as evidenced by a score of 57 on FOTO measure for full participation in activities at home and in the community.    Baseline 03/18/21: 47.     04/22/21: 67.   05/25/21:76.    Time 10    Period Weeks    Status Achieved    Target Date 05/27/21      PT LONG TERM GOAL #2   Title Pt will increase strength of tested  lower extremity musculature to at least 4+ MMT grade in order to demonstrate improvement in strength and function required for prolonged weightbearing activity, stair/step and obstacle negotiation    Baseline 03/18/21: Hip/knee musculature 3+ to 4- in RLE, grossly 4/5 in LLE.  04/22/21: Met for all LE muscle groups tested.    05/25/21: Met for all LE muscle groups tested.    Time 10    Period Weeks  Status Achieved    Target Date 05/27/21      PT LONG TERM GOAL #3   Title Pt will decrease worst pain as reported on NPRS by at least 3 points in order to demonstrate clinically significant reduction in pain.    Baseline 03/18/21: Pain up to 10/10 at worst.    04/22/21: Pain up to 3/10 over the previous week.    05/25/21: 2/10 at worst over the last week.    Time 10    Period Weeks    Status Achieved    Target Date 05/27/21      PT LONG TERM GOAL #4   Title Patient will ambulate up to 150 feet in hallway with least-restrictive assistive device with no LOB or lower extremity buckling and significant increase in baseline pain by 2 points or greater on NPRS - indicative of improved household-level and short-distance mobility    Baseline 03/18/21: Significant limitation with prolonged standing activity, gait deficits and notable pain with ambulation c front-wheeled walker.   04/22/21: Ambulated 80 feet with SPC only with mild decreased stance time RLE and decreased gait velocity.    05/25/21: Ambulated x 210 feet in hallway with no AD, no buckling or LOB    Time 10    Period Weeks    Status Achieved    Target Date 05/27/21      PT LONG TERM GOAL #5   Title Patient will improve BERG by 3 points or greater indicative of decreased risk of falling and improved function for performing functional mobility ADLs and transferring    Baseline 03/18/21: BERG to be performed on visit # 2.   03/23/21: BERG 41/56.   04/27/21: BERG 52/56.   05/25/21: BERG 56/56    Time 10    Period Weeks    Status Achieved    Target Date  05/27/21      Additional Long Term Goals   Additional Long Term Goals Yes      PT LONG TERM GOAL #6   Title Patient will ambulate with no AD for community-level distance (300 ft or greater) around perimeter of building including traversing of uneven surfaces, varying surface textures (cement, asphalt, grass), and negotiating incline/decline without reproduction of pain, LE buckling, or LOB    Baseline 05/25/21: Pt able to ambulate up to 210 feet on even ground with no AD and no buckling or LOB.    Time 12    Period Weeks    Status New    Target Date 06/10/21                   Plan - 06/02/21 1252     Clinical Impression Statement Patient has no significant pain at this time and is able to perform sit to stand from 90 deg knee flexion without anterior knee pain. She tolerates progression of closed-chain loading and dynamic balance training without pain or LOB. She demonstrates safe negotiation of clinic without use of AD at this time. Patient is making excellent progress and is approaching readiness for discharge. Patient will benefit from continued skilled PT intervention to fully wean from use of AD and attain full recovery of function.    Personal Factors and Comorbidities Comorbidity 3+;Time since onset of injury/illness/exacerbation;Past/Current Experience;Behavior Pattern    Comorbidities Anxiety, depression, Hx of schizoaffective disorder, thyroid disease, obesity, chronic bilateral knee pain, GERD    Examination-Activity Limitations Bed Mobility;Dressing;Stand;Stairs;Bend;Sit;Transfers;Lift;Locomotion Level;Squat    Examination-Participation Restrictions Cleaning    Stability/Clinical Decision Making Unstable/Unpredictable  Rehab Potential Fair    PT Frequency 2x / week    PT Duration Other (comment)   10 weeks   PT Treatment/Interventions Cryotherapy;Electrical Stimulation;ADLs/Self Care Home Management;Gait training;Stair training;Functional mobility training;Therapeutic  activities;Therapeutic exercise;Balance training;Neuromuscular re-education;Patient/family education;Manual techniques;Passive range of motion    PT Next Visit Plan Continued with gait training with LRAD, low-impact lower extremity isotonics with increasing emphasis on closed-chain strengthening. Postural stability training and fall risk management.    PT Home Exercise Plan Access Code [email or web address]    Consulted and Agree with Plan of Care Patient             Patient will benefit from skilled therapeutic intervention in order to improve the following deficits and impairments:  Abnormal gait, Decreased activity tolerance, Decreased range of motion, Decreased strength, Hypomobility, Pain, Difficulty walking, Decreased balance  Visit Diagnosis: Chronic pain of both knees  Difficulty in walking, not elsewhere classified  Muscle weakness (generalized)  Personal history of fall     Problem List Patient Active Problem List   Diagnosis Date Noted   Migraine without aura and without status migrainosus, not intractable 03/05/2021   Mild intermittent asthma without complication 95/42/4814   Gastroesophageal reflux disease 12/01/2020   Hypoglycemia 12/01/2020   History of hypoglycemia 04/26/2020   History of hyperprolactinemia 04/26/2020   Rheumatoid factor positive 12/09/2019   SS-A antibody positive 10/28/2019   Chronic pain of both knees 10/28/2019   Subclinical hypothyroidism 08/02/2019   Hyperprolactinemia ([place]) 08/02/2019   Schizoaffective disorder, depressive type ([place]) 02/02/2018   Suicide attempt ([place]) 01/16/2018   Dysfunctional uterine bleeding 01/06/2018   Borderline personality disorder ([place]) 01/04/2018   PTSD (post-traumatic stress disorder) 01/03/2018   ASCUS of cervix with negative high risk HPV 08/28/2017   Malingering 05/06/2016   Breast tumor 03/05/2016   Schizoaffective disorder, bipolar type ([place]) 11/06/2014   Hypertension 09/29/2014   Valentina Gu, PT, DPT  #[id number]  Eilleen Kempf, PT 06/02/2021, 12:52 PM  [facility] [address]. [place], [place], [address] Phone: [phone]   Fax:  [phone]  [name] MRN: [id number] Date of Birth: [birth year]

## 2021-06-03 ENCOUNTER — Other Ambulatory Visit: Payer: Self-pay

## 2021-06-03 ENCOUNTER — Ambulatory Visit: Payer: Medicare Other | Admitting: Physical Therapy

## 2021-06-03 ENCOUNTER — Encounter: Payer: Self-pay | Admitting: Physical Therapy

## 2021-06-03 DIAGNOSIS — G8929 Other chronic pain: Secondary | ICD-10-CM

## 2021-06-03 DIAGNOSIS — M25561 Pain in right knee: Secondary | ICD-10-CM

## 2021-06-03 DIAGNOSIS — M6281 Muscle weakness (generalized): Secondary | ICD-10-CM

## 2021-06-03 DIAGNOSIS — R262 Difficulty in walking, not elsewhere classified: Secondary | ICD-10-CM

## 2021-06-03 DIAGNOSIS — Z9181 History of falling: Secondary | ICD-10-CM

## 2021-06-03 NOTE — Therapy (Signed)
[facility] [address] [place], [place], [address] Phone: [phone]   Fax:  [phone]  Physical Therapy Treatment  Patient Details  [name] MRN: [id number] Date of Birth: 1990-07-19 Referring Provider (PT): Rosalia Hammers, MD   Encounter Date: 06/03/2021   PT End of Session - 06/03/21 1323     Visit Number 21    Number of Visits 23    Date for PT Re-Evaluation 05/27/21    Authorization Type Medicare/Medicaid, VL based on medical necessity, no auth    Authorization Time Period Cert 1/70/01-7/49/44, prog note 05/25/21    Progress Note Due on Visit 28    PT Start Time 1241    PT Stop Time 1329    PT Time Calculation (min) 48 min    Equipment Utilized During Treatment --   front-wheel walker (patient has her own walker), SPC   Activity Tolerance Patient tolerated treatment well    Behavior During Therapy [facility] for tasks assessed/performed             Past Medical History:  Diagnosis Date   Allergy    Anxiety    Arthritis    Asthma    Depression    Foreign body aspiration    GERD (gastroesophageal reflux disease)    Hallucinations 09/30/2014   Sizoaffective   Hyperlipidemia    Hypertension    Intentional ingestion of batteries 04/21/2016    2 AAA batteries and 1 thumb tack; intent to hurt herself/notes 96/11/5914   Self-inflicted injury 38/46/6599   Suicidal ideation 01/02/2018   Tardive dyskinesia 10/2014   recent onset   Thyroid disease     Past Surgical History:  Procedure Laterality Date   ABDOMINAL SURGERY     "years ago" to remove foreign objects   APPENDECTOMY     BREAST EXCISIONAL BIOPSY Right 09/03/2007   surgical bx removed fibroadeoma   BREAST LUMPECTOMY Right    COLONOSCOPY WITH PROPOFOL N/A 09/10/2015   Procedure: COLONOSCOPY WITH PROPOFOL;  Surgeon: [name], MD;  Location: [facility] ENDOSCOPY;  Service: Endoscopy;  Laterality: N/A;   ESOPHAGOGASTRODUODENOSCOPY N/A  11/28/2014   Procedure: ESOPHAGOGASTRODUODENOSCOPY (EGD);  Surgeon: [name], MD;  Location: [facility] ENDOSCOPY;  Service: Endoscopy;  Laterality: N/A;   ESOPHAGOGASTRODUODENOSCOPY N/A 02/21/2016   Procedure: ESOPHAGOGASTRODUODENOSCOPY (EGD);  Surgeon: Mauri Pole, MD;  Location: [facility] ENDOSCOPY;  Service: Endoscopy;  Laterality: N/A;   ESOPHAGOGASTRODUODENOSCOPY N/A 04/21/2016   Procedure: ESOPHAGOGASTRODUODENOSCOPY (EGD);  Surgeon: Gatha Mayer, MD;  Location: [facility] ENDOSCOPY;  Service: Endoscopy;  Laterality: N/A;   ESOPHAGOGASTRODUODENOSCOPY N/A 05/06/2016   Procedure: ESOPHAGOGASTRODUODENOSCOPY (EGD);  Surgeon: Jonathon Bellows, MD;  Location: [facility] ENDOSCOPY;  Service: Gastroenterology;  Laterality: N/A;   ESOPHAGOGASTRODUODENOSCOPY N/A 06/13/2016   Procedure: ESOPHAGOGASTRODUODENOSCOPY (EGD);  Surgeon: [name], MD;  Location: [facility] ENDOSCOPY;  Service: Endoscopy;  Laterality: N/A;   ESOPHAGOGASTRODUODENOSCOPY (EGD) WITH PROPOFOL N/A 02/29/2016   Procedure: ESOPHAGOGASTRODUODENOSCOPY (EGD) WITH PROPOFOL;  Surgeon: [name], MD;  Location: [facility] ENDOSCOPY;  Service: Endoscopy;  Laterality: N/A;   ESOPHAGOGASTRODUODENOSCOPY (EGD) WITH PROPOFOL N/A 01/15/2018   Procedure: ESOPHAGOGASTRODUODENOSCOPY (EGD) WITH PROPOFOL;  Surgeon: Daneil Dolin, MD;  Location: [facility] ENDO SUITE;  Service: Endoscopy;  Laterality: N/A;  retrieval forein body   LAPAROTOMY N/A 09/12/2015   Procedure: EXPLORATORY LAPAROTOMY;  Surgeon: Florene Glen, MD;  Location: [facility] ORS;  Service: General;  Laterality: N/A;   SIGMOIDOSCOPY N/A 09/12/2015   Procedure: Lonell Face;  Surgeon: Florene Glen, MD;  Location: [facility] ORS;  Service: General;  Laterality: N/A;   WISDOM TOOTH EXTRACTION      There were no vitals filed for this visit.   Subjective Assessment - 06/03/21 1247     Subjective Patient reports feeling well at arrival to PT today. Patient reports feeling confident with walking without AD. Patient  reports feeling good with continuing on her own following this week's visits. She reports doing well with no AD use for most community-level mobility at this time, and she voices understanding of using it in circumstances during which she has any safety concerns.    Pertinent History Patient is a 31 year old female with involved psych medical history with primary complaint of chronic bilateral knee pain with gait instability and Hx of frequent falls. Patient has had multiple conservative treatments for knee pain including anti-inflammatories, injections. Hx of R ACL sprain and post-injury rehab. She states that recently injections helped for about 3 weeks. Patient reports notable "aching" behind her kneecap with prolonged sitting (pre-patellar pain). Patient reports ongoing pain and dysfunction since 2020. Patient has frequent falls. Patient reports about 10 falls over the last 6 months. She reports direct impact onto her knees often with her falls. Patient had isolated incident of numbness due to notable swelling after fall, but has not felt numbness or paresthesias recently. When questioned about neuro history, patient reports "changes in white matter" and associated migraines (present since [age] years old, have remained stable since then). Pt lives with her fiance. Patient has 3 steps to get into her home and pt uses railing. Patient reports she has to negotiate grass on level ground, but she can negotiate this well. Patient reports falls can happen with prolonged standing, walking, and functional reaching while standing. She states her R knee intermittently "gives out." Pt has continuous aching in her knees.    Limitations Standing;Sitting;House hold activities;Walking    Diagnostic tests MRI and knee radiograph - see below    Patient Stated Goals Improved ability to walk, less falls, improved knee pain    Currently in Pain? No/denies                TREATMENT     Therapeutic Exercise - for  improved soft tissue flexibility and extensibility as needed for ROM, improved strength as needed to improve performance of CKC activities/functional movements   *Additional goal update and discharge planning  Pt edu: HEP update and review. Discussed goals met and appropriateness for discharge. Educated patient on ability to follow-up with clinic as needed if she experiences regression in condition or safety concerns within next month    Glute Bridge; 1x10; with stool under calves to simulate bridge on furniture in home - for [employer]   Sit to stand with raised table; 2x12; with 10-lb [place] Goblet hold         *not today* NuStep x 5 minutes, Level 6-7; for knee AAROM and warm-up for improved soft tissue mobility/extensibility and muscle performance  Gait in hallway with no AD; x 210 feet, with supervision of PT Straight leg raise; 2x12 each LE, supine, with 5-lb  Ambulate lap around gym x1 with FWW; CGA Quad set; 2x10, 5 sec, bilateral     Neuromuscular Re-education - for postural stability/postural control, gluteal musculature activation and exercises to promote LE kinetic chain stability, dynamic gait stability   Gait with no AD; around perimeter of building - including gait over sidewalk, curb cut, grass, decline, curb step up and down x 6 min  Adjacent to treadmill arm support: Long Airex Tandem walk; 5x D/B, forward only Single Airex pad, balance bubble, two 6-inch hurdles; consecutive negotiation of 3 obstacles; 3x D/B  Forward step-up with opposite knee driver, 6-inch step (staircase in middle of gym); 1x10 each LE - for HEP demo  Consecutive obstacle step-over: two 6-inch hurdles, balance bubble, and Airex pad; 4x D/B      *not today* 3-way slider, standing; x5 ea dir; anterior/lateral/posterior 3-way Hip; 5-lb ankle weight bilat LE; x8 ea dir on each LE 3 consecutive Airex pads on floor, step on/over each; 4x D/B Toe tapping to two stacked 6-inch steps with stance  on Airex; 2x10, alternating Sidestepping; 4x D/B, no UE support with BlueTband around thighs superior to patellae  Toe tapping to 12-inch step (two steps stacked); 1x10 alternating [report of R knee pain toward end of set] Minisquat 0-30 deg; 2x10           ASSESSMENT Patient has made excellent progress to date and has made all initially established PT goals. She has now met additional long-term goal for community-level ambulation with inclusion of uneven surfaces without AD. She has minimal pain at this time and is making positive lifestyle changes for long-term health including improving nutrition and engaging in regular aerobic exercise and exercises given in PT. She demonstrates safe gait and stair negotiation at this time and voices no significant safety concerns for accessing her home or community. Patient is appropriate for discharge and continued independent home program at this time. Pt to continue with trial of HEP and will discharge case if pt is able to continue with self-management over the next month without notable setback.       PT Short Term Goals - 05/25/21 1347       PT SHORT TERM GOAL #1   Title Pt will be independent with HEP in order to decrease knee pain and increase strength in order to improve pain-free function at home and work.    Baseline 03/18/21: HEP to be initiated next visit.   04/22/21: Patient is compliant with home exercise program and verbalizes and demonstrates sound understanding of exercises.  05/25/21: Pt has excellent compliance and understanding of HEP.    Time 3    Period Weeks    Status Achieved    Target Date 04/08/21      PT SHORT TERM GOAL #2   Title Patient will perform sit to stand from standard chair without significant UE support and no increase in baseline pain indicative of improved functional LE strength as needed to prevent fall during episode of LOB and improved ability to transfer    Baseline 03/18/21: Significant difficulty and heavy UE  suppot with weight shift to LLE for transferring.   04/22/21: Moderate UE support required for sit to stand from standard height chair; she can perform sit to stand without UE support from raised surface.   05/25/21: Pt able to perform sit to stand from [place] chair without LOB and no reproduction of knee pain.    Time 6    Period Weeks    Status Achieved    Target Date 05/06/21               PT Long Term Goals - 06/03/21 1345       PT LONG TERM GOAL #1   Title Patient will demonstrate improved function as evidenced by a score of 57 on FOTO measure for full participation in activities at home and in the community.  Baseline 03/18/21: 47.     04/22/21: 67.   05/25/21:76.    Time 10    Period Weeks    Status Achieved    Target Date 05/27/21      PT LONG TERM GOAL #2   Title Pt will increase strength of tested lower extremity musculature to at least 4+ MMT grade in order to demonstrate improvement in strength and function required for prolonged weightbearing activity, stair/step and obstacle negotiation    Baseline 03/18/21: Hip/knee musculature 3+ to 4- in RLE, grossly 4/5 in LLE.  04/22/21: Met for all LE muscle groups tested.    05/25/21: Met for all LE muscle groups tested.    Time 10    Period Weeks    Status Achieved    Target Date 05/27/21      PT LONG TERM GOAL #3   Title Pt will decrease worst pain as reported on NPRS by at least 3 points in order to demonstrate clinically significant reduction in pain.    Baseline 03/18/21: Pain up to 10/10 at worst.    04/22/21: Pain up to 3/10 over the previous week.    05/25/21: 2/10 at worst over the last week.    Time 10    Period Weeks    Status Achieved    Target Date 05/27/21      PT LONG TERM GOAL #4   Title Patient will ambulate up to 150 feet in hallway with least-restrictive assistive device with no LOB or lower extremity buckling and significant increase in baseline pain by 2 points or greater on NPRS - indicative of  improved household-level and short-distance mobility    Baseline 03/18/21: Significant limitation with prolonged standing activity, gait deficits and notable pain with ambulation c front-wheeled walker.   04/22/21: Ambulated 80 feet with SPC only with mild decreased stance time RLE and decreased gait velocity.    05/25/21: Ambulated x 210 feet in hallway with no AD, no buckling or LOB    Time 10    Period Weeks    Status Achieved    Target Date 05/27/21      PT LONG TERM GOAL #5   Title Patient will improve BERG by 3 points or greater indicative of decreased risk of falling and improved function for performing functional mobility ADLs and transferring    Baseline 03/18/21: BERG to be performed on visit # 2.   03/23/21: BERG 41/56.   04/27/21: BERG 52/56.   05/25/21: BERG 56/56    Time 10    Period Weeks    Status Achieved    Target Date 05/27/21      PT LONG TERM GOAL #6   Title Patient will ambulate with no AD for community-level distance (300 ft or greater) around perimeter of building including traversing of uneven surfaces, varying surface textures (cement, asphalt, grass), and negotiating incline/decline without reproduction of pain, LE buckling, or LOB    Baseline 05/25/21: Pt able to ambulate up to 210 feet on even ground with no AD and no buckling or LOB.   06/03/21: Pt able to ambulate for >300 ft around perimeter of building with negotiation of cement, asphault, grass, mulch and stepping up/down onto curb x 2 with no LOB, knee pain, or LE buckling    Time 12    Period Weeks    Status Achieved    Target Date 06/10/21                   Plan -  06/03/21 1353     Clinical Impression Statement Patient has made excellent progress to date and has made all initially established PT goals. She has now met additional long-term goal for community-level ambulation with inclusion of uneven surfaces without AD. She has minimal pain at this time and is making positive lifestyle changes for  long-term health including improving nutrition and engaging in regular aerobic exercise and exercises given in PT. She demonstrates safe gait and stair negotiation at this time and voices no significant safety concerns for accessing her home or community. Patient is appropriate for discharge and continued independent home program at this time. Pt to continue with trial of HEP and will discharge case if pt is able to continue with self-management over the next month without notable setback.    Personal Factors and Comorbidities Comorbidity 3+;Time since onset of injury/illness/exacerbation;Past/Current Experience;Behavior Pattern    Comorbidities Anxiety, depression, Hx of schizoaffective disorder, thyroid disease, obesity, chronic bilateral knee pain, GERD    Examination-Activity Limitations Bed Mobility;Dressing;Stand;Stairs;Bend;Sit;Transfers;Lift;Locomotion Level;Squat    Examination-Participation Restrictions Cleaning    Stability/Clinical Decision Making Unstable/Unpredictable    Rehab Potential Fair    PT Frequency 2x / week    PT Duration Other (comment)   10 weeks   PT Treatment/Interventions Cryotherapy;Electrical Stimulation;ADLs/Self Care Home Management;Gait training;Stair training;Functional mobility training;Therapeutic activities;Therapeutic exercise;Balance training;Neuromuscular re-education;Patient/family education;Manual techniques;Passive range of motion    PT Next Visit Plan Pt to continue with trial of HEP and will discharge case if pt is able to continue with self-management over the next month without notable setback.    PT Home Exercise Plan Access Code [email or web address]    Consulted and Agree with Plan of Care Patient             Patient will benefit from skilled therapeutic intervention in order to improve the following deficits and impairments:  Abnormal gait, Decreased activity tolerance, Decreased range of motion, Decreased strength, Hypomobility, Pain, Difficulty walking,  Decreased balance  Visit Diagnosis: Chronic pain of both knees  Difficulty in walking, not elsewhere classified  Muscle weakness (generalized)  Personal history of fall     Problem List Patient Active Problem List   Diagnosis Date Noted   Migraine without aura and without status migrainosus, not intractable 03/05/2021   Mild intermittent asthma without complication 81/27/5170   Gastroesophageal reflux disease 12/01/2020   Hypoglycemia 12/01/2020   History of hypoglycemia 04/26/2020   History of hyperprolactinemia 04/26/2020   Rheumatoid factor positive 12/09/2019   SS-A antibody positive 10/28/2019   Chronic pain of both knees 10/28/2019   Subclinical hypothyroidism 08/02/2019   Hyperprolactinemia ([place]) 08/02/2019   Schizoaffective disorder, depressive type ([place]) 02/02/2018   Suicide attempt ([place]) 01/16/2018   Dysfunctional uterine bleeding 01/06/2018   Borderline personality disorder ([place]) 01/04/2018   PTSD (post-traumatic stress disorder) 01/03/2018   ASCUS of cervix with negative high risk HPV 08/28/2017   Malingering 05/06/2016   Breast tumor 03/05/2016   Schizoaffective disorder, bipolar type ([place]) 11/06/2014   Hypertension 09/29/2014   Valentina Gu, PT, DPT #[id number]  Eilleen Kempf, PT 06/03/2021, 1:54 PM  [facility] [address] [place], [place], [address] Phone: [phone]   Fax:  [phone]  [name] MRN: [id number] Date of Birth: 1991/03/26

## 2021-06-08 ENCOUNTER — Encounter: Payer: Medicare Other | Admitting: Physical Therapy

## 2021-06-08 ENCOUNTER — Other Ambulatory Visit: Payer: Self-pay | Admitting: Internal Medicine

## 2021-06-08 DIAGNOSIS — K219 Gastro-esophageal reflux disease without esophagitis: Secondary | ICD-10-CM

## 2021-06-08 NOTE — Telephone Encounter (Signed)
Requested Prescriptions  Pending Prescriptions Disp Refills   omeprazole (PRILOSEC) 20 MG capsule [Pharmacy Med Name: OMEPRAZOLE 20 MG CPDR 20 Capsule] 28 capsule 0    Sig: TAKE 1 CAPSULE (20 MG TOTAL) BY MOUTH DAILY.     Gastroenterology: Proton Pump Inhibitors Passed - 06/08/2021  9:00 AM      Passed - Valid encounter within last 12 months    Recent Outpatient Visits          3 months ago Hypoglycemia   [facility] Glean Hess, MD   6 months ago Hypoglycemia   [facility] Glean Hess, MD      Future Appointments            In 2 months Philip Aspen, CNM [facility]   In 2 months Army Melia, Jesse Sans, MD [facility]

## 2021-06-10 ENCOUNTER — Encounter: Payer: Medicare Other | Admitting: Physical Therapy

## 2021-06-15 ENCOUNTER — Encounter: Payer: Medicare Other | Admitting: Physical Therapy

## 2021-06-17 ENCOUNTER — Encounter: Payer: Medicare Other | Admitting: Physical Therapy

## 2021-06-25 ENCOUNTER — Encounter: Payer: Self-pay | Admitting: Internal Medicine

## 2021-06-25 ENCOUNTER — Ambulatory Visit (INDEPENDENT_AMBULATORY_CARE_PROVIDER_SITE_OTHER): Payer: Medicare Other | Admitting: Internal Medicine

## 2021-06-25 ENCOUNTER — Other Ambulatory Visit: Payer: Self-pay

## 2021-06-25 VITALS — BP 128/84 | HR 74 | Ht 66.0 in | Wt 307.4 lb

## 2021-06-25 DIAGNOSIS — E039 Hypothyroidism, unspecified: Secondary | ICD-10-CM | POA: Diagnosis not present

## 2021-06-25 DIAGNOSIS — E221 Hyperprolactinemia: Secondary | ICD-10-CM

## 2021-06-25 LAB — COMPREHENSIVE METABOLIC PANEL
ALT: 7 U/L (ref 0–35)
AST: 13 U/L (ref 0–37)
Albumin: 3.9 g/dL (ref 3.5–5.2)
Alkaline Phosphatase: 76 U/L (ref 39–117)
BUN: 15 mg/dL (ref 6–23)
CO2: 32 mEq/L (ref 19–32)
Calcium: 9.9 mg/dL (ref 8.4–10.5)
Chloride: 103 mEq/L (ref 96–112)
Creatinine, Ser: 0.8 mg/dL (ref 0.40–1.20)
GFR: 97.74 mL/min (ref >60.00)
Glucose, Bld: 89 mg/dL (ref 70–99)
Potassium: 4.6 mEq/L (ref 3.5–5.1)
Sodium: 140 mEq/L (ref 135–145)
Total Bilirubin: 0.4 mg/dL (ref 0.2–1.2)
Total Protein: 6.9 g/dL (ref 6.0–8.3)

## 2021-06-25 LAB — TSH: TSH: 1.13 u[IU]/mL (ref 0.35–5.50)

## 2021-06-25 NOTE — Progress Notes (Signed)
Name: Francena Zender  MRN/ DOB: [id number], Dec 16, 1990    Age/ Sex: 31 y.o., female     PCP: Glean Hess, MD   Reason for Endocrinology Evaluation: Hyperprolactinemia      Initial Endocrinology Clinic Visit: 08/02/2019    PATIENT IDENTIFIER: Ms. Janith Nielson is a 31 y.o., female with a past medical history of Bipolar disorder and anxiety. She has followed with [facility] since 08/02/2019 for consultative assistance with management of her hyperprolactinemia .   HISTORICAL SUMMARY:  Pt was noted to have an elevated prolactin level at 66.3 ng/mL, during evaluation of secondary amenorrhea and galactorrhea in 06/2019.    She does endorse history of elevated prolactin since her teenage years.  She has been diagnosed with bipolar disorder, PTSD and anxiety and has been on multiple antipsychotic medications over the years.  She has been on ox carbamazepine as well as risperidone in the past.   Prolactin on initial presentation to our clinic was 40.2 ng/mL, with normal LH, FSH, Testosterone and TSH    An MRI was obtained 09/09/2019 which demonstrated normal pituitary but abnormal white matter.  Pt was advised to discuss switching Invega with psych as it has been associated with elevated prolactin .    She was seen by Dr. Tomi Likens ( Neurology for abnormal white matter)   Sister  And mother with hypothyroidism and  Sister with hyperthyroidism    She was started on cabergoline 04/2021 with elevated prolactin again with a max level of 62.7 ng/mL    HYPOTHYROID HISTORY:  Patient reports being on LT-4 replacement therapy since her teenage years, she had ran out of her medication by 06/2019 and was advised not to restart since her TFTs were normal. Repeat TSH elevated in 07/2019 was elevated at 4.77 uIU/mL. And LT-4 replacement restarted.   SUBJECTIVE:    Today (12/20/2019):  Ms. Yorks is here for hyperprolactinemia and hypothyroidism.   Got married 1/7th  2023 She does not have a guardian anymore, reinstated her rights 07/2020  LMP 2/2nd    Has chronic constipation - uses miralax  Denies nausea and vomiting  Denies local neck symptoms  Denies palpitations  Denies Galactorrhea  Denies vision changes  She is thinking of conceiving   Starting cosmetology school in 12/2020  Medications Levothyroxine 75 mcg daily  Carbergoline 0.5 weekly     HISTORY:  Past Medical History:  Past Medical History:  Diagnosis Date   Allergy    Anxiety    Arthritis    Asthma    Depression    Foreign body aspiration    GERD (gastroesophageal reflux disease)    Hallucinations 09/30/2014   Sizoaffective   Hyperlipidemia    Hypertension    Intentional ingestion of batteries 04/21/2016    2 AAA batteries and 1 thumb tack; intent to hurt herself/notes 38/11/5641   Self-inflicted injury 32/95/1884   Suicidal ideation 01/02/2018   Tardive dyskinesia 10/2014   recent onset   Thyroid disease    Past Surgical History:  Past Surgical History:  Procedure Laterality Date   ABDOMINAL SURGERY     "years ago" to remove foreign objects   APPENDECTOMY     BREAST EXCISIONAL BIOPSY Right 09/03/2007   surgical bx removed fibroadeoma   BREAST LUMPECTOMY Right    COLONOSCOPY WITH PROPOFOL N/A 09/10/2015   Procedure: COLONOSCOPY WITH PROPOFOL;  Surgeon: [name], MD;  Location: [facility] ENDOSCOPY;  Service: Endoscopy;  Laterality: N/A;   ESOPHAGOGASTRODUODENOSCOPY N/A 11/28/2014  Procedure: ESOPHAGOGASTRODUODENOSCOPY (EGD);  Surgeon: [name], MD;  Location: [facility] ENDOSCOPY;  Service: Endoscopy;  Laterality: N/A;   ESOPHAGOGASTRODUODENOSCOPY N/A 02/21/2016   Procedure: ESOPHAGOGASTRODUODENOSCOPY (EGD);  Surgeon: Mauri Pole, MD;  Location: [facility] ENDOSCOPY;  Service: Endoscopy;  Laterality: N/A;   ESOPHAGOGASTRODUODENOSCOPY N/A 04/21/2016   Procedure: ESOPHAGOGASTRODUODENOSCOPY (EGD);  Surgeon: Gatha Mayer, MD;  Location: [facility] ENDOSCOPY;   Service: Endoscopy;  Laterality: N/A;   ESOPHAGOGASTRODUODENOSCOPY N/A 05/06/2016   Procedure: ESOPHAGOGASTRODUODENOSCOPY (EGD);  Surgeon: Jonathon Bellows, MD;  Location: [facility] ENDOSCOPY;  Service: Gastroenterology;  Laterality: N/A;   ESOPHAGOGASTRODUODENOSCOPY N/A 06/13/2016   Procedure: ESOPHAGOGASTRODUODENOSCOPY (EGD);  Surgeon: [name], MD;  Location: [facility] ENDOSCOPY;  Service: Endoscopy;  Laterality: N/A;   ESOPHAGOGASTRODUODENOSCOPY (EGD) WITH PROPOFOL N/A 02/29/2016   Procedure: ESOPHAGOGASTRODUODENOSCOPY (EGD) WITH PROPOFOL;  Surgeon: [name], MD;  Location: [facility] ENDOSCOPY;  Service: Endoscopy;  Laterality: N/A;   ESOPHAGOGASTRODUODENOSCOPY (EGD) WITH PROPOFOL N/A 01/15/2018   Procedure: ESOPHAGOGASTRODUODENOSCOPY (EGD) WITH PROPOFOL;  Surgeon: Daneil Dolin, MD;  Location: [facility] ENDO SUITE;  Service: Endoscopy;  Laterality: N/A;  retrieval forein body   LAPAROTOMY N/A 09/12/2015   Procedure: EXPLORATORY LAPAROTOMY;  Surgeon: Florene Glen, MD;  Location: [facility] ORS;  Service: General;  Laterality: N/A;   SIGMOIDOSCOPY N/A 09/12/2015   Procedure: Lonell Face;  Surgeon: Florene Glen, MD;  Location: [facility] ORS;  Service: General;  Laterality: N/A;   WISDOM TOOTH EXTRACTION     Social History:  reports that she quit smoking about 4 years ago. Her smoking use included cigarettes. She has a 1.50 pack-year smoking history. She has never used smokeless tobacco. She reports that she does not drink alcohol and does not use drugs. Family History:  Family History  Problem Relation Age of Onset   Depression Mother    Hypertension Mother    Sleep apnea Mother    Asthma Mother    COPD Mother    Diabetes Mother    Anxiety disorder Mother    Arthritis Mother    Breast cancer Maternal Aunt        20's   Stroke Maternal Grandmother      HOME MEDICATIONS: Allergies as of 06/25/2021       Reactions   Betadine [povidone Iodine] Other (See Comments)   Reaction:  Unknown    Fish Allergy  Hives   Iodine Rash   Shellfish-derived Products Other (See Comments)   Reaction:  Unknown         Medication List        Accurate as of June 25, 2021 12:36 PM. If you have any questions, ask your nurse or doctor.          STOP taking these medications    baclofen 10 MG tablet Commonly known as: LIORESAL Stopped by: Dorita Sciara, MD       TAKE these medications    albuterol 108 (90 Base) MCG/ACT inhaler Commonly known as: VENTOLIN HFA 2 puffs four times a day as needed   cabergoline 0.5 MG tablet Commonly known as: DOSTINEX Take 1 tablet (0.5 mg total) by mouth once a week.   escitalopram 5 MG tablet Commonly known as: LEXAPRO Take 5 mg by mouth every morning.   hydrOXYzine 50 MG capsule Commonly known as: VISTARIL Take 50 mg by mouth 2 (two) times daily as needed.   levothyroxine 75 MCG tablet Commonly known as: SYNTHROID Take 1 tablet (75 mcg total) by mouth daily.   lithium carbonate 300 MG capsule Take 1  capsule (300 mg total) by mouth 2 (two) times daily with a meal.   magnesium oxide 400 MG tablet Commonly known as: MAG-OX Take 400 mg by mouth daily.   meloxicam 7.5 MG tablet Commonly known as: MOBIC Take 7.5 mg by mouth daily.   omeprazole 20 MG capsule Commonly known as: PRILOSEC TAKE 1 CAPSULE (20 MG TOTAL) BY MOUTH DAILY.   paliperidone 6 MG 24 hr tablet Commonly known as: INVEGA Take 6 mg by mouth every morning.   prazosin 2 MG capsule Commonly known as: MINIPRESS Take 1 capsule (2 mg total) by mouth at bedtime.   pregabalin 75 MG capsule Commonly known as: LYRICA Take 75 mg by mouth 2 (two) times daily.          OBJECTIVE:   PHYSICAL EXAM: VS: BP 128/84 (BP Location: Left Arm, Patient Position: Sitting, Cuff Size: Normal)    Pulse 74    Ht 5\' 6"  (1.676 m)    Wt (!) 307 lb 6.4 oz (139.4 kg)    LMP 01/11/2021 (Exact Date)    SpO2 94%    BMI 49.62 kg/m   EXAM: General: Pt appears well and is in NAD  Neck:  General: Supple without adenopathy. Thyroid: Thyroid size normal.    Lungs: Clear with good BS bilat with no rales, rhonchi, or wheezes  Heart: Auscultation: RRR.  Abdomen: Normoactive bowel sounds, soft, nontender, without masses or organomegaly palpable  Extremities:  BL LE: No pretibial edema normal ROM and strength.  Mental Status: Judgment, insight: Intact Mood and affect: No depression, anxiety, or agitation     DATA REVIEWED:   Latest Reference Range & Units 06/25/21 11:06  Sodium 135 - 145 mEq/L 140  Potassium 3.5 - 5.1 mEq/L 4.6  Chloride 96 - 112 mEq/L 103  CO2 19 - 32 mEq/L 32  Glucose 70 - 99 mg/dL 89  BUN 6 - 23 mg/dL 15  Creatinine 0.40 - 1.20 mg/dL 0.80  Calcium 8.4 - 10.5 mg/dL 9.9  Alkaline Phosphatase 39 - 117 U/L 76  Albumin 3.5 - 5.2 g/dL 3.9  AST 0 - 37 U/L 13  ALT 0 - 35 U/L 7  Total Protein 6.0 - 8.3 g/dL 6.9  Total Bilirubin 0.2 - 1.2 mg/dL 0.4  GFR >60.00 mL/min 97.74    Latest Reference Range & Units 06/25/21 11:06  Prolactin ng/mL 31.3 (H)  Glucose 70 - 99 mg/dL 89  Preg, Serum  NEGATIVE  TSH 0.35 - 5.50 uIU/mL 1.13    MRI brain 09/09/2019 Scattered foci of T2 hyperintensity are seen within the white matter of the cerebral hemispheres predominantly deep and juxtacortical white matter. No posterior fossa lesion identified. None of the lesions show enhancement after the intravenous administration of contrast.   Pituitary/Sella: The pituitary gland is normal in appearance and size for age without mass lesion. Mildly heterogeneous enhancement in the dynamic phase with homogeneous enhancement on the delayed is within normal limits. A normal posterior pituitary bright spot is seen. The infundibulum is midline. The hypothalamus and mamillary bodies are normal. There is no mass effect on the optic chiasm or optic nerves. The infundibular and chiasmatic recesses are clear. Normal cavernous sinus and cavernous internal carotid artery  flow voids.  ASSESSMENT / PLAN / RECOMMENDATIONS:    1.   Hypothyroidism   - Pt is clinically and biochemically euthyroid   - No local neck symptoms  -Preconception counseling done today, she was advised to increase LT-4 replacement by 20% and to schedule a  follow-up with endocrinology within 6 weeks  -TSH normal Medication  Continue levothyroxine 75 mcg daily    2.Hyperprolactinemia:     -Patient with chronic long-term history of intermittent hyperprolactinemia.This is attributed to multiple antipsychotic medications that have she has been on and off since her teenage years, that are known to cause hyperprolactinemia such as risperidone and paliperidone Lorayne Bender ) -Repeat prolactin levels have started to increase in 2022, requiring initiation of small dose of cabergoline - MRI did NOT show any evidence of pituitary adenoma  -Patient advised to stop cabergoline with positive pregnancy test  -Prolactin trending down but continues to be above goal, will increase the dose as below Medication   Increase cabergoline 0.5 mg, 1 tablet twice weekly   F/U in 4 months     Signed electronically by: Mack Guise, MD  [facility] Endocrinology  [facility] [address] [place], [place] [address] Phone: [phone] FAX: [phone]      CC: Glean Hess, [address] [place] [address] Phone: [phone]  Fax: [phone]   Return to Endocrinology clinic as below: Future Appointments  Date Time Provider [place]  07/20/2021 10:50 AM Pieter Partridge, DO [facility] None  08/25/2021 10:00 AM Philip Aspen, CNM [facility] None  09/03/2021  9:40 AM Glean Hess, MD [facility]  11/01/2021 10:50 AM Charm Stenner, Melanie Crazier, MD [facility] None  01/05/2022  2:40 PM [place] ADVISOR [facility]

## 2021-06-26 LAB — PROLACTIN: Prolactin: 31.3 ng/mL — ABNORMAL HIGH

## 2021-06-26 LAB — HCG, SERUM, QUALITATIVE: Preg, Serum: NEGATIVE

## 2021-06-28 MED ORDER — CABERGOLINE 0.5 MG PO TABS: 0.5000 mg | ORAL_TABLET | ORAL | 2 refills | Status: DC

## 2021-06-28 MED ORDER — LEVOTHYROXINE SODIUM 75 MCG PO TABS: 75.0000 ug | ORAL_TABLET | Freq: Every day | ORAL | 3 refills | Status: DC

## 2021-07-01 ENCOUNTER — Other Ambulatory Visit: Payer: Self-pay

## 2021-07-01 ENCOUNTER — Ambulatory Visit
Admit: 2021-07-01 | Discharge: 2021-07-01 | Disposition: A | Discharge status: Home / Self Care | Payer: Medicare Other | Attending: *Deleted | Admitting: *Deleted

## 2021-07-01 ENCOUNTER — Telehealth: Payer: Self-pay | Admitting: Family

## 2021-07-01 ENCOUNTER — Ambulatory Visit
Admission: EM | Admit: 2021-07-01 | Discharge: 2021-07-01 | Disposition: A | Discharge status: Home / Self Care | Payer: Medicare Other | Attending: Family | Admitting: Family

## 2021-07-01 DIAGNOSIS — M79661 Pain in right lower leg: Secondary | ICD-10-CM | POA: Insufficient documentation

## 2021-07-01 DIAGNOSIS — M7989 Other specified soft tissue disorders: Secondary | ICD-10-CM | POA: Diagnosis present

## 2021-07-01 IMAGING — US US EXTREM LOW VENOUS*R*
1 series · 14 of 24 positions shown · non-contrast
Comparison: None.

CLINICAL DATA: Right leg pain and swelling

EXAM:
RIGHT LOWER EXTREMITY VENOUS DOPPLER ULTRASOUND
TECHNIQUE: Gray-scale sonography with compression, as well as color and duplex
ultrasound, were performed to evaluate the deep venous system(s)
from the level of the common femoral vein through the popliteal and
proximal calf veins.

[Series 1: us extrem low venous*right* · 14 of 39 slices shown]
[im 1/39]
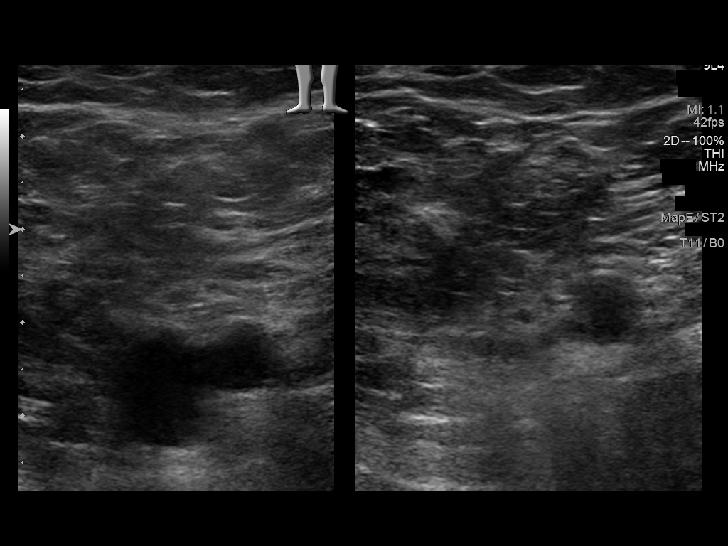
[im 4/39]
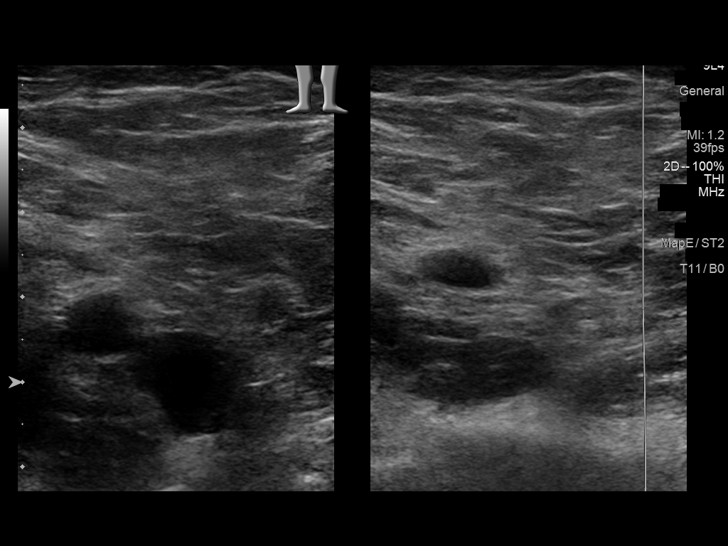
[im 7/39]
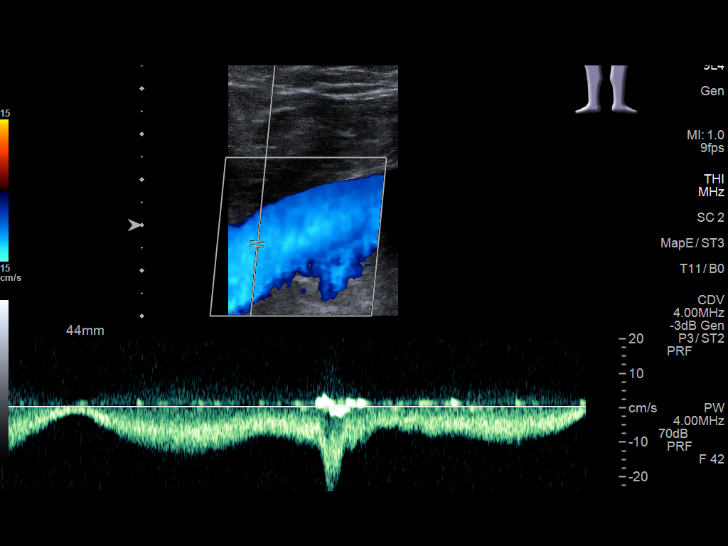
[im 10/39]
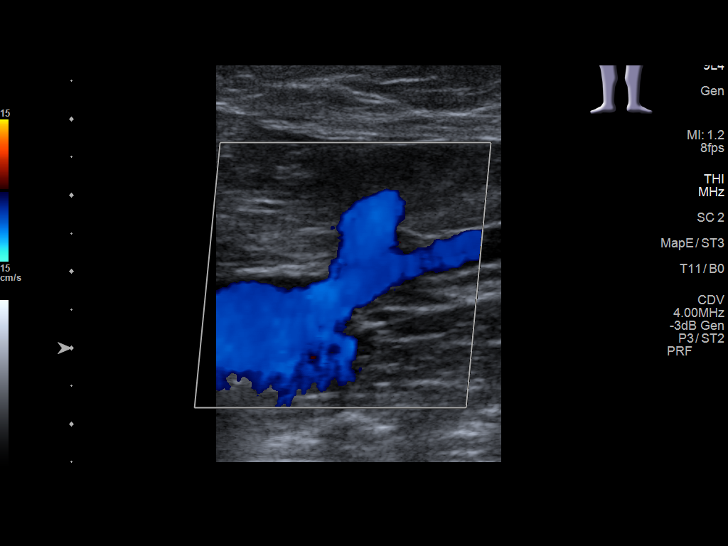
[im 12/39]
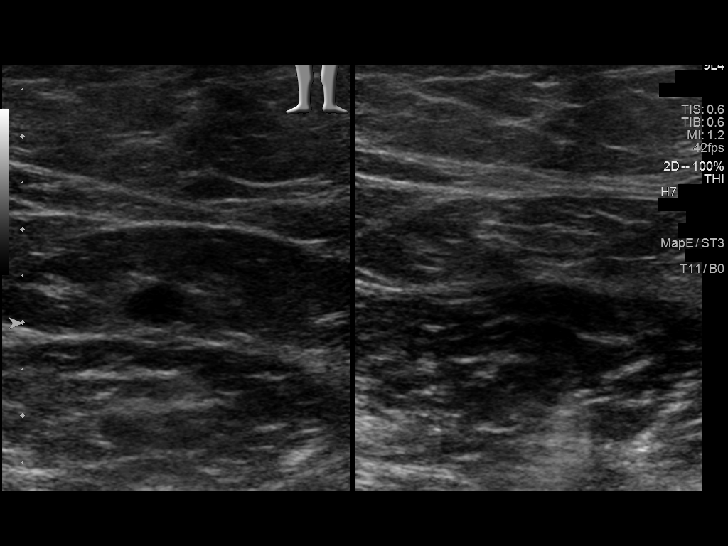
[im 15/39]
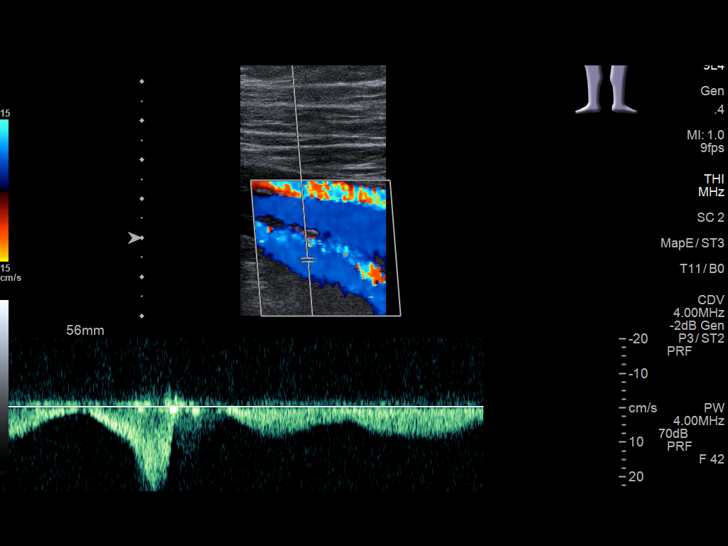
[im 19/39]
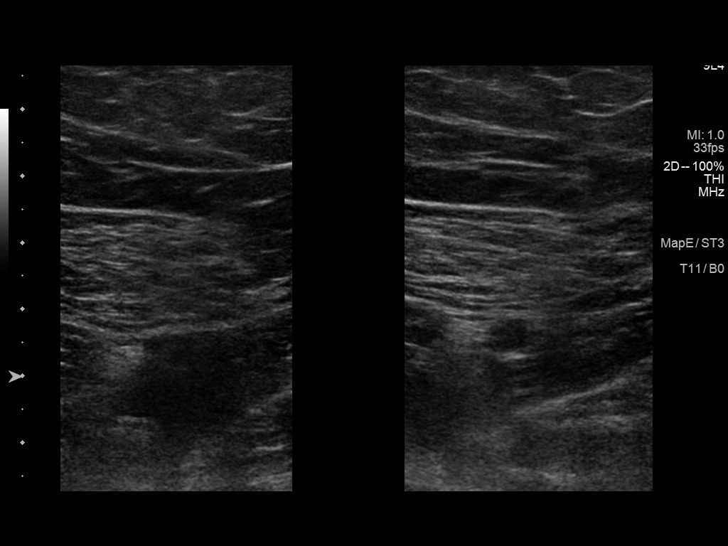
[im 20/39]
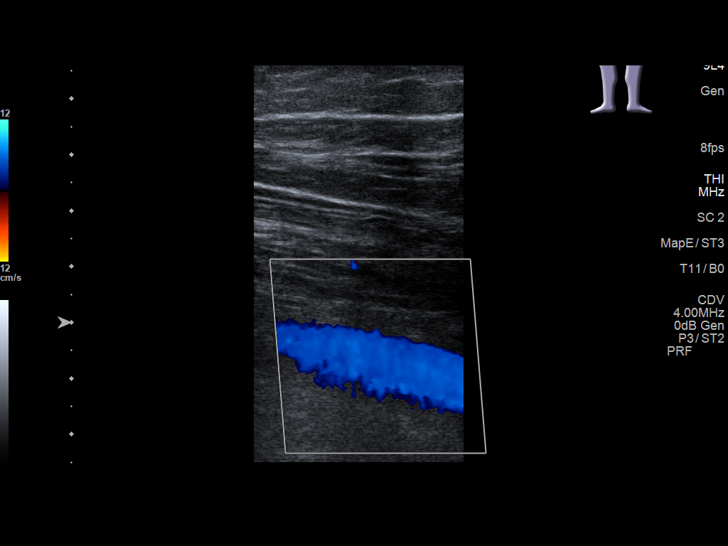
[im 24/39]
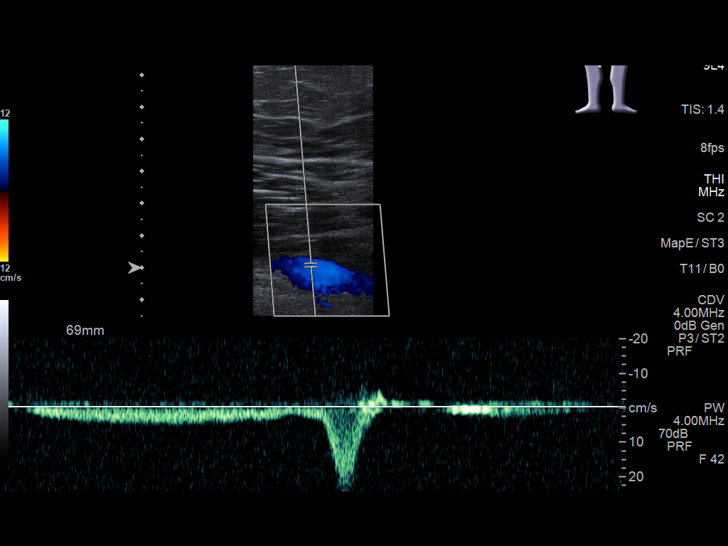
[im 27/39]
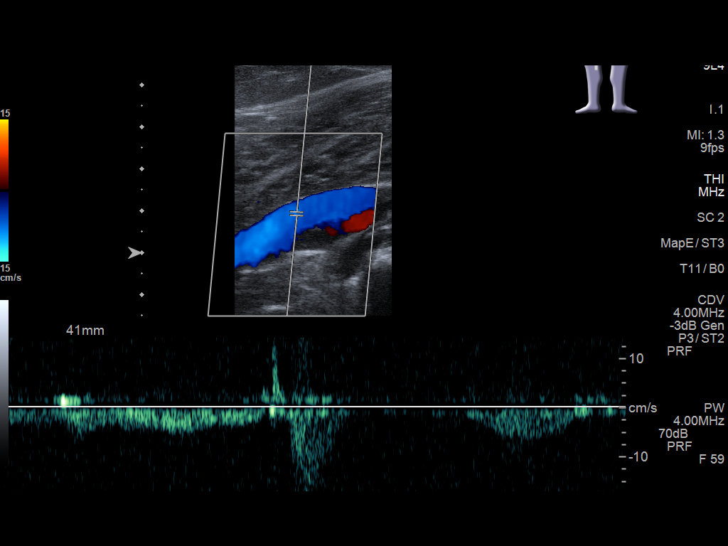
[im 30/39]
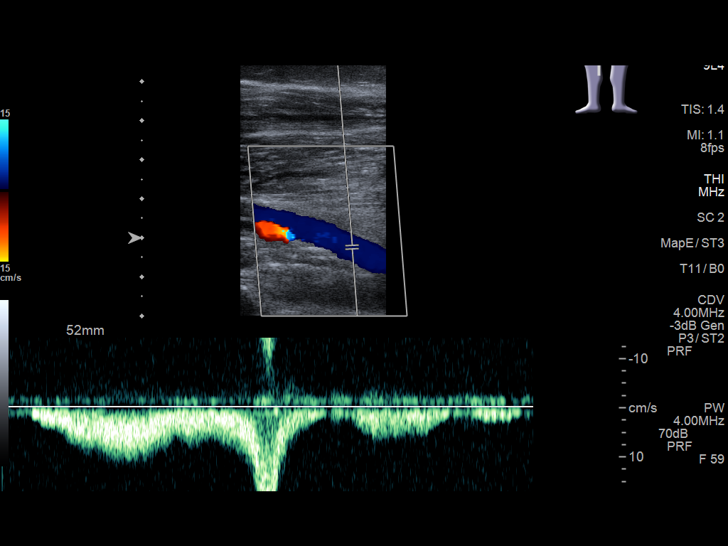
[im 32/39]
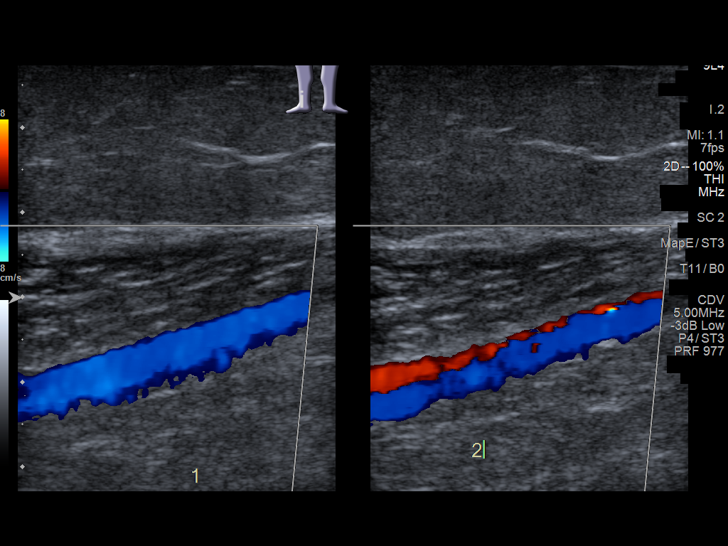
[im 35/39]
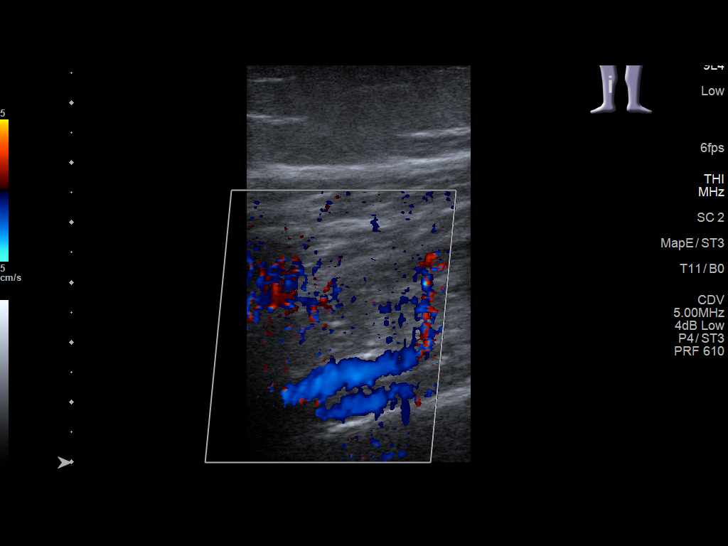
[im 39/39]
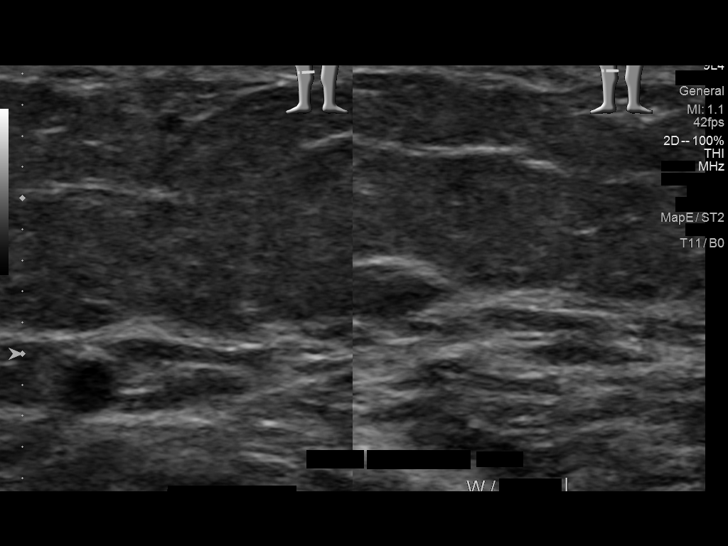

[14 of 24 positions shown; findings below may reference images not displayed]

FINDINGS: VENOUS

Normal compressibility of the common femoral, superficial femoral,
and popliteal veins, as well as the visualized calf veins.
Visualized portions of profunda femoral vein and great saphenous
vein unremarkable. No filling defects to suggest DVT on grayscale or
color Doppler imaging. Doppler waveforms show normal direction of
venous flow, normal respiratory plasticity and response to
augmentation.

Limited views of the contralateral common femoral vein are
unremarkable.

OTHER

None.

Limitations: none
IMPRESSION: Negative examination for deep venous thrombosis in the right lower
extremity.

These results will be called to the ordering clinician or
representative by the Radiologist Assistant, and communication
documented in the PACS or [REDACTED].

## 2021-07-01 NOTE — Discharge Instructions (Addendum)
Recommend Venous ultrasound of your right leg to make sure you do not have a clot in your right leg. You are scheduled for 2:30pm today here. We will call you after we get the results. If you do have a clot, you will need to go to the hospital/ER ASAP. Otherwise, recommend follow-up with your Orthopedic for further evaluation of right leg pain and swelling.

## 2021-07-01 NOTE — ED Provider Notes (Signed)
[facility]    CSN: [id number] Arrival date & time: 07/01/21  1133      History   Chief Complaint Chief Complaint  Patient presents with   Leg Pain    Right     HPI Melanie Bell is a 31 y.o. female.   31 year old female accompanied by a caregiver presents with right lower leg pain and swelling that started about 3 days ago. Getting worse, with swelling present above her right knee mostly medial along with a small area of swelling and discoloration of her right lower leg. Having some numbness, burning and intermittent pain of her right leg. Has history of osteoarthritis of her right leg/knee but this pain feels different. Also has history of fibromyalgia. Contacted her Orthopedic/Rheumatologist who recommended she come to Urgent Care to rule out a blood clot. Has tried applying ice and heat and elevating right leg with no relief. Has history of HTN, hyperlipidemia, thyroid disorder, hyperprolactinemia, schizoaffective disorder, GERD and asthma. Currently on Minipress, Prilosec, Lyrica, Mobic, Lexapro, Lithium, Synthroid, Dostinex, Vistaril, Invega daily and Albuterol prn.   The history is provided by the patient and a caregiver.   Past Medical History:  Diagnosis Date   Allergy    Anxiety    Arthritis    Asthma    Depression    Foreign body aspiration    GERD (gastroesophageal reflux disease)    Hallucinations 09/30/2014   Sizoaffective   Hyperlipidemia    Hypertension    Intentional ingestion of batteries 04/21/2016    2 AAA batteries and 1 thumb tack; intent to hurt herself/notes 84/10/9627   Self-inflicted injury 52/84/1324   Suicidal ideation 01/02/2018   Tardive dyskinesia 10/2014   recent onset   Thyroid disease     Patient Active Problem List   Diagnosis Date Noted   Acquired hypothyroidism 06/25/2021   Migraine without aura and without status migrainosus, not intractable 03/05/2021   Mild intermittent asthma without complication 40/02/2724    Gastroesophageal reflux disease 12/01/2020   Hypoglycemia 12/01/2020   History of hypoglycemia 04/26/2020   History of hyperprolactinemia 04/26/2020   Rheumatoid factor positive 12/09/2019   SS-A antibody positive 10/28/2019   Chronic pain of both knees 10/28/2019   Subclinical hypothyroidism 08/02/2019   Hyperprolactinemia ([place]) 08/02/2019   Schizoaffective disorder, depressive type ([place]) 02/02/2018   Suicide attempt ([place]) 01/16/2018   Dysfunctional uterine bleeding 01/06/2018   Borderline personality disorder ([place]) 01/04/2018   PTSD (post-traumatic stress disorder) 01/03/2018   ASCUS of cervix with negative high risk HPV 08/28/2017   Malingering 05/06/2016   Breast tumor 03/05/2016   Schizoaffective disorder, bipolar type ([place]) 11/06/2014   Hypertension 09/29/2014    Past Surgical History:  Procedure Laterality Date   ABDOMINAL SURGERY     "years ago" to remove foreign objects   APPENDECTOMY     BREAST EXCISIONAL BIOPSY Right 09/03/2007   surgical bx removed fibroadeoma   BREAST LUMPECTOMY Right    COLONOSCOPY WITH PROPOFOL N/A 09/10/2015   Procedure: COLONOSCOPY WITH PROPOFOL;  Surgeon: [name], MD;  Location:  [facility] ENDOSCOPY;  Service: Endoscopy;  Laterality: N/A;   ESOPHAGOGASTRODUODENOSCOPY N/A 11/28/2014   Procedure: ESOPHAGOGASTRODUODENOSCOPY (EGD);  Surgeon: [name], MD;  Location: [facility] ENDOSCOPY;  Service: Endoscopy;  Laterality: N/A;   ESOPHAGOGASTRODUODENOSCOPY N/A 02/21/2016   Procedure: ESOPHAGOGASTRODUODENOSCOPY (EGD);  Surgeon: Mauri Pole, MD;  Location: [facility] ENDOSCOPY;  Service: Endoscopy;  Laterality: N/A;   ESOPHAGOGASTRODUODENOSCOPY N/A 04/21/2016   Procedure: ESOPHAGOGASTRODUODENOSCOPY (EGD);  Surgeon: Glendell Docker  Simonne Maffucci, MD;  Location: [place];  Service: Endoscopy;  Laterality: N/A;   ESOPHAGOGASTRODUODENOSCOPY N/A 05/06/2016   Procedure: ESOPHAGOGASTRODUODENOSCOPY (EGD);  Surgeon: Jonathon Bellows, MD;  Location: [facility] ENDOSCOPY;   Service: Gastroenterology;  Laterality: N/A;   ESOPHAGOGASTRODUODENOSCOPY N/A 06/13/2016   Procedure: ESOPHAGOGASTRODUODENOSCOPY (EGD);  Surgeon: [name], MD;  Location: [facility] ENDOSCOPY;  Service: Endoscopy;  Laterality: N/A;   ESOPHAGOGASTRODUODENOSCOPY (EGD) WITH PROPOFOL N/A 02/29/2016   Procedure: ESOPHAGOGASTRODUODENOSCOPY (EGD) WITH PROPOFOL;  Surgeon: [name], MD;  Location: [facility] ENDOSCOPY;  Service: Endoscopy;  Laterality: N/A;   ESOPHAGOGASTRODUODENOSCOPY (EGD) WITH PROPOFOL N/A 01/15/2018   Procedure: ESOPHAGOGASTRODUODENOSCOPY (EGD) WITH PROPOFOL;  Surgeon: Daneil Dolin, MD;  Location: [facility] ENDO [facility]ITE;  Service: Endoscopy;  Laterality: N/A;  retrieval forein body   LAPAROTOMY N/A 09/12/2015   Procedure: EXPLORATORY LAPAROTOMY;  Surgeon: Florene Glen, MD;  Location: [facility] ORS;  Service: General;  Laterality: N/A;   SIGMOIDOSCOPY N/A 09/12/2015   Procedure: Lonell Face;  Surgeon: Florene Glen, MD;  Location: [facility] ORS;  Service: General;  Laterality: N/A;   WISDOM TOOTH EXTRACTION      OB History     Gravida  4   Para  0   Term  0   Preterm  0   AB  3   Living  0      SAB  3   IAB  0   Ectopic  0   Multiple  0   Live Births               Home Medications    Prior to Admission medications   Medication Sig Start Date End Date Taking? Authorizing Provider  albuterol (VENTOLIN HFA) 108 (90 Base) MCG/ACT inhaler 2 puffs four times a day as needed 12/01/20  Yes Glean Hess, MD  cabergoline (DOSTINEX) 0.5 MG tablet Take 1 tablet (0.5 mg total) by mouth 2 (two) times a week. 06/28/21  Yes Shamleffer, Melanie Crazier, MD  escitalopram (LEXAPRO) 5 MG tablet Take 5 mg by mouth every morning. 05/27/19  Yes [provider]  hydrOXYzine (VISTARIL) 50 MG capsule Take 50 mg by mouth 2 (two) times daily as needed. 08/20/19  Yes [provider]  levothyroxine (SYNTHROID) 75 MCG tablet Take 1 tablet (75 mcg total) by mouth daily. 06/28/21   Yes Shamleffer, Melanie Crazier, MD  lithium carbonate 300 MG capsule Take 1 capsule (300 mg total) by mouth 2 (two) times daily with a meal. 12/11/17  Yes [name], [name], MD  magnesium oxide (MAG-OX) 400 MG tablet Take 400 mg by mouth daily.   Yes [provider]  meloxicam (MOBIC) 7.5 MG tablet Take 7.5 mg by mouth daily. 02/22/21  Yes [provider]  omeprazole (PRILOSEC) 20 MG capsule TAKE 1 CAPSULE (20 MG TOTAL) BY MOUTH DAILY. 06/08/21  Yes Glean Hess, MD  paliperidone (INVEGA) 6 MG 24 hr tablet Take 6 mg by mouth every morning. 05/30/19  Yes [provider]  prazosin (MINIPRESS) 2 MG capsule Take 1 capsule (2 mg total) by mouth at bedtime. 01/12/18  Yes McNew, Tyson Babinski, MD  pregabalin (LYRICA) 75 MG capsule Take 75 mg by mouth 2 (two) times daily.   Yes [provider]    Family History Family History  Problem Relation Age of Onset   Depression Mother    Hypertension Mother    Sleep apnea Mother    Asthma Mother    COPD Mother    Diabetes Mother    Anxiety  disorder Mother    Arthritis Mother    Breast cancer Maternal Aunt        20's   Stroke Maternal Grandmother     Social History Social History   Tobacco Use   Smoking status: Former    Packs/day: 0.50    Years: 3.00    Pack years: 1.50    Types: Cigarettes    Quit date: 08/14/2016    Years since quitting: 4.8   Smokeless tobacco: Never  Vaping Use   Vaping Use: Never used  Substance Use Topics   Alcohol use: No   Drug use: No     Allergies   Betadine [povidone iodine], Fish allergy, Iodine, and Shellfish-derived products   Review of Systems Review of Systems  Constitutional:  Negative for chills and fever.  Respiratory:  Negative for chest tightness and shortness of breath.   Cardiovascular:  Positive for leg swelling. Negative for chest pain and palpitations.  Musculoskeletal:  Positive for arthralgias, gait problem, joint swelling and myalgias.  Skin:   Positive for color change. Negative for rash and wound.  Allergic/Immunologic: Positive for environmental allergies and food allergies.  Neurological:  Positive for weakness and numbness. Negative for syncope and speech difficulty.  Hematological:  Negative for adenopathy. Does not bruise/bleed easily.    Physical Exam Triage Vital Signs ED Triage Vitals  Enc Vitals Group     BP 07/01/21 1141 121/89     Pulse Rate 07/01/21 1141 85     Resp 07/01/21 1141 20     Temp 07/01/21 1141 98.3 F (36.8 C)     Temp Source 07/01/21 1141 Oral     SpO2 07/01/21 1141 98 %     Weight 07/01/21 1140 (!) 307 lb (139.3 kg)     Height 07/01/21 1140 5\' 7"  (1.702 m)     Head Circumference --      Peak Flow --      Pain Score 07/01/21 1140 5     Pain Loc --      Pain Edu? --      Excl. in [place]? --    No data found.  Updated Vital Signs BP 121/89 (BP Location: Left Arm)    Pulse 85    Temp 98.3 F (36.8 C) (Oral)    Resp 20    Ht 5\' 7"  (1.702 m)    Wt (!) 307 lb (139.3 kg)    LMP 06/17/2021 (Exact Date)    SpO2 98%    Breastfeeding Unknown    BMI 48.08 kg/m   Visual Acuity Right Eye Distance:   Left Eye Distance:   Bilateral Distance:    Right Eye Near:   Left Eye Near:    Bilateral Near:     Physical Exam Vitals and nursing note reviewed.  Constitutional:      General: She is awake. She is not in acute distress.    Appearance: She is well-developed. She is obese.     Comments: She is sitting on the exam table in no acute distress but appears uncomfortable when moving her right leg due to pain.   HENT:     Head: Normocephalic and atraumatic.     Right Ear: Hearing normal.     Left Ear: Hearing normal.  Cardiovascular:     Rate and Rhythm: Normal rate.     Pulses:          Dorsalis pedis pulses are 2+ on the right side and 2+ on the left  side.       Posterior tibial pulses are 2+ on the right side and 2+ on the left side.  Pulmonary:     Effort: Pulmonary effort is normal.   Musculoskeletal:        General: Swelling and tenderness present.     Right upper leg: Swelling and tenderness present.     Left upper leg: Normal.     Right lower leg: Swelling and tenderness present. 1+ Edema present.     Left lower leg: No tenderness. 1+ Edema present.       Legs:     Comments: Slight discoloration/more prominent blue color to skin along right tibial vein below knee. Very tender and positive Homan's sign with dorsiflexion of right calf. No distinct redness. Slight swelling present. Slight swelling also present just above patella/knee on both medial and lateral aspects of her lower thigh. No redness. Tender along medial aspect of thigh. Lower leg edema present but non-pitting. Good distal pulses and sensation. No distinct neuro deficits noted.   Skin:    General: Skin is warm and dry.     Capillary Refill: Capillary refill takes less than 2 seconds.     Findings: No abscess, bruising, ecchymosis, erythema, lesion, petechiae, rash or wound.  Neurological:     General: No focal deficit present.     Mental Status: She is alert and oriented to person, place, and time.     Sensory: Sensation is intact.     Motor: No tremor, atrophy, abnormal muscle tone or seizure activity.     Gait: Gait abnormal.     Comments: Uses a cane for assistance in ambulating.   Psychiatric:        Attention and Perception: Attention normal.        Mood and Affect: Mood normal.        Speech: Speech normal.        Behavior: Behavior is cooperative.        Thought Content: Thought content normal.        Cognition and Memory: Cognition normal.        Judgment: Judgment normal.     UC Treatments / Results  Labs (all labs ordered are listed, but only abnormal results are displayed) Labs Reviewed - No data to display  EKG   Radiology US Venous Img Lower Unilateral Right (DVT)  Result Date: 07/01/2021 CLINICAL DATA:  Right leg pain and swelling EXAM: RIGHT LOWER EXTREMITY VENOUS DOPPLER  ULTRASOUND TECHNIQUE: Gray-scale sonography with compression, as well as color and duplex ultrasound, were performed to evaluate the deep venous system(s) from the level of the common femoral vein through the popliteal and proximal calf veins. COMPARISON:  None. FINDINGS: VENOUS Normal compressibility of the common femoral, superficial femoral, and popliteal veins, as well as the visualized calf veins. Visualized portions of profunda femoral vein and great saphenous vein unremarkable. No filling defects to suggest DVT on grayscale or color Doppler imaging. Doppler waveforms show normal direction of venous flow, normal respiratory plasticity and response to augmentation. Limited views of the contralateral common femoral vein are unremarkable. OTHER None. Limitations: none IMPRESSION: Negative examination for deep venous thrombosis in the right lower extremity. These results will be called to the ordering clinician or representative by the Radiologist Assistant, and communication documented in the PACS or [employer]. Electronically Signed   By: Delanna Ahmadi M.D.   On: 07/01/2021 14:48    Procedures Procedures (including critical care time)  Medications Ordered in UC  Medications - No data to display  Initial Impression / Assessment and Plan / UC Course  I have reviewed the triage vital signs and the nursing notes.  Pertinent labs & imaging results that were available during my care of the patient were reviewed by me and considered in my medical decision making (see chart for details).     Discussed with patient and caregiver that we will try to schedule a venous ultrasound of her right leg to rule out DVT. She does not have any transportation for over 3 hours to be able to go to any other health care facility. Was able to schedule her for outpatient imaging at [place] [facility] for 2:30pm today. Reviewed briefly with patient that if the ultrasound does reveal a clot, she will need to go to the  ER/hospital ASAP. If negative (no clot), recommend follow-up with her Orthopedic/Rheumatologist in 1 to 2 days for further evaluation. Will  notify patient of results of ultrasound when available and follow-up as planned.  Final Clinical Impressions(s) / UC Diagnoses   Final diagnoses:  Pain and swelling of right lower leg  Right leg swelling     Discharge Instructions      Recommend Venous ultrasound of your right leg to make sure you do not have a clot in your right leg. You are scheduled for 2:30pm today here. We will call you after we get the results. If you do have a clot, you will need to go to the hospital/ER ASAP. Otherwise, recommend follow-up with your Orthopedic for further evaluation of right leg pain and swelling.     ED Prescriptions   None    PDMP not reviewed this encounter.   Katy Apo, NP 07/01/21 1645

## 2021-07-01 NOTE — ED Triage Notes (Signed)
Patient is here for "Leg Pain, Right". A lot of swelling, pain in right leg, recurrent symptoms. Currently followed by specialty for Osteoarthritis. Called provider today and recommended UC due to "possible blood clot".

## 2021-07-20 ENCOUNTER — Ambulatory Visit: Payer: Medicare Other | Admitting: Neurology

## 2021-07-30 ENCOUNTER — Ambulatory Visit (INDEPENDENT_AMBULATORY_CARE_PROVIDER_SITE_OTHER): Payer: Medicare Other | Admitting: Internal Medicine

## 2021-07-30 ENCOUNTER — Encounter: Payer: Self-pay | Admitting: Internal Medicine

## 2021-07-30 ENCOUNTER — Other Ambulatory Visit: Payer: Self-pay

## 2021-07-30 VITALS — BP 112/78 | HR 78 | Temp 97.9°F | Ht 67.0 in | Wt 307.0 lb

## 2021-07-30 DIAGNOSIS — J0100 Acute maxillary sinusitis, unspecified: Secondary | ICD-10-CM

## 2021-07-30 MED ORDER — CEFDINIR 300 MG PO CAPS: 300.0000 mg | ORAL_CAPSULE | Freq: Two times a day (BID) | ORAL | 0 refills | Status: AC

## 2021-07-30 NOTE — Patient Instructions (Signed)
Coricidin HBP -take it as directed for congestion ?

## 2021-07-30 NOTE — Progress Notes (Signed)
? ? ?Date:  07/30/2021  ? ?Name:  Melanie Bell   DOB:  Oct 25, 1990   MRN:  [id number] ? ? ?Chief Complaint: Sore Throat ? ?Sore Throat  ?This is a new problem. The current episode started 1 to 4 weeks ago. The problem has been waxing and waning. The maximum temperature recorded prior to her arrival was 102 - 102.9 F (intermittent). The fever has been present for 1 to 2 days. The pain is at a severity of 6/10. The pain is moderate. Associated symptoms include congestion, diarrhea, ear pain (Right), trouble swallowing and vomiting. Pertinent negatives include no coughing, headaches or shortness of breath.  ? ?Lab Results  ?Component Value Date  ? NA 140 06/25/2021  ? K 4.6 06/25/2021  ? CO2 32 06/25/2021  ? GLUCOSE 89 06/25/2021  ? BUN 15 06/25/2021  ? CREATININE 0.80 06/25/2021  ? CALCIUM 9.9 06/25/2021  ? GFRNONAA >60 01/31/2018  ? ?Lab Results  ?Component Value Date  ? CHOL 142 07/01/2019  ? HDL 50 07/01/2019  ? [place] 79 07/01/2019  ? TRIG 63 07/01/2019  ? CHOLHDL 2.8 07/01/2019  ? ?Lab Results  ?Component Value Date  ? TSH 1.13 06/25/2021  ? ?Lab Results  ?Component Value Date  ? HGBA1C 5.1 11/23/2017  ? ?Lab Results  ?Component Value Date  ? WBC 5.9 02/09/2018  ? HGB 12.3 02/09/2018  ? HCT 36.0 02/09/2018  ? MCV 91.6 02/09/2018  ? PLT 205 02/09/2018  ? ?Lab Results  ?Component Value Date  ? ALT 7 06/25/2021  ? AST 13 06/25/2021  ? ALKPHOS 76 06/25/2021  ? BILITOT 0.4 06/25/2021  ? ?No results found for: 25OHVITD2, [place], VD25OH  ? ?Review of Systems  ?Constitutional:  Positive for fever. Negative for chills and fatigue.  ?HENT:  Positive for congestion, ear pain (Right), postnasal drip, sinus pressure, sore throat and trouble swallowing.   ?Respiratory:  Negative for cough, shortness of breath and wheezing.   ?Cardiovascular:  Negative for chest pain and palpitations.  ?Gastrointestinal:  Positive for diarrhea and vomiting.  ?Neurological:  Negative for dizziness and headaches.  ? ?Patient Active  Problem List  ? Diagnosis Date Noted  ?? Acquired hypothyroidism 06/25/2021  ?? Migraine without aura and without status migrainosus, not intractable 03/05/2021  ?? Mild intermittent asthma without complication 94/50/3888  ?? Gastroesophageal reflux disease 12/01/2020  ?? Hypoglycemia 12/01/2020  ?? History of hypoglycemia 04/26/2020  ?? History of hyperprolactinemia 04/26/2020  ?? Rheumatoid factor positive 12/09/2019  ?? SS-A antibody positive 10/28/2019  ?? Chronic pain of both knees 10/28/2019  ?? Subclinical hypothyroidism 08/02/2019  ?? Hyperprolactinemia ([place]) 08/02/2019  ?? Schizoaffective disorder, depressive type ([place]) 02/02/2018  ?? Suicide attempt ([place]) 01/16/2018  ?? Dysfunctional uterine bleeding 01/06/2018  ?? Borderline personality disorder ([place]) 01/04/2018  ?? PTSD (post-traumatic stress disorder) 01/03/2018  ?? ASCUS of cervix with negative high risk HPV 08/28/2017  ?? Malingering 05/06/2016  ?? Breast tumor 03/05/2016  ?? Schizoaffective disorder, bipolar type ([place]) 11/06/2014  ?? Hypertension 09/29/2014  ? ? ?Allergies  ?Allergen Reactions  ?? Betadine [Povidone Iodine] Other (See Comments)  ?  Reaction:  Unknown   ?? Fish Allergy Hives  ?? Iodine Rash  ?? Shellfish-Derived Products Other (See Comments)  ?  Reaction:  Unknown   ? ? ?Past Surgical History:  ?Procedure Laterality Date  ?? ABDOMINAL SURGERY    ? "years ago" to remove foreign objects  ?? APPENDECTOMY    ?? BREAST EXCISIONAL BIOPSY Right 09/03/2007  ? surgical bx  removed fibroadeoma  ?? BREAST LUMPECTOMY Right   ?? COLONOSCOPY WITH PROPOFOL N/A 09/10/2015  ? Procedure: COLONOSCOPY WITH PROPOFOL;  Surgeon: [name], MD;  Location: [facility] ENDOSCOPY;  Service: Endoscopy;  Laterality: N/A;  ?? ESOPHAGOGASTRODUODENOSCOPY N/A 11/28/2014  ? Procedure: ESOPHAGOGASTRODUODENOSCOPY (EGD);  Surgeon: [name], MD;  Location: [facility] ENDOSCOPY;  Service: Endoscopy;  Laterality: N/A;  ?? ESOPHAGOGASTRODUODENOSCOPY N/A 02/21/2016  ?  Procedure: ESOPHAGOGASTRODUODENOSCOPY (EGD);  Surgeon: Mauri Pole, MD;  Location: [facility] ENDOSCOPY;  Service: Endoscopy;  Laterality: N/A;  ?? ESOPHAGOGASTRODUODENOSCOPY N/A 04/21/2016  ? Procedure: ESOPHAGOGASTRODUODENOSCOPY (EGD);  Surgeon: Gatha Mayer, MD;  Location: [facility] ENDOSCOPY;  Service: Endoscopy;  Laterality: N/A;  ?? ESOPHAGOGASTRODUODENOSCOPY N/A 05/06/2016  ? Procedure: ESOPHAGOGASTRODUODENOSCOPY (EGD);  Surgeon: Jonathon Bellows, MD;  Location: [facility] ENDOSCOPY;  Service: Gastroenterology;  Laterality: N/A;  ?? ESOPHAGOGASTRODUODENOSCOPY N/A 06/13/2016  ? Procedure: ESOPHAGOGASTRODUODENOSCOPY (EGD);  Surgeon: [name], MD;  Location: [facility] ENDOSCOPY;  Service: Endoscopy;  Laterality: N/A;  ?? ESOPHAGOGASTRODUODENOSCOPY (EGD) WITH PROPOFOL N/A 02/29/2016  ? Procedure: ESOPHAGOGASTRODUODENOSCOPY (EGD) WITH PROPOFOL;  Surgeon: [name], MD;  Location: [facility] ENDOSCOPY;  Service: Endoscopy;  Laterality: N/A;  ?? ESOPHAGOGASTRODUODENOSCOPY (EGD) WITH PROPOFOL N/A 01/15/2018  ? Procedure: ESOPHAGOGASTRODUODENOSCOPY (EGD) WITH PROPOFOL;  Surgeon: Daneil Dolin, MD;  [facility] ENDO [facility];  Service: Endoscopy;  Laterality: N/A;  retrieval forein body  ?? LAPAROTOMY N/A 09/12/2015  ? Procedure: EXPLORATORY LAPAROTOMY;  Surgeon: Florene Glen, MD;  Location: [facility] ORS;  Service: General;  Laterality: N/A;  ?? SIGMOIDOSCOPY N/A 09/12/2015  ? Procedure: SIGMOIDOSCOPY;  Surgeon: Florene Glen, MD;  Location: [facility] ORS;  Service: General;  Laterality: N/A;  ?? WISDOM TOOTH EXTRACTION    ? ? ?Social History  ? ?Tobacco Use  ?? Smoking status: Former  ?  Packs/day: 0.50  ?  Years: 3.00  ?  Pack years: 1.50  ?  Types: Cigarettes  ?  Quit date: 08/14/2016  ?  Years since quitting: 4.9  ?? Smokeless tobacco: Never  ?Vaping Use  ?? Vaping Use: Never used  ?Substance Use Topics  ?? Alcohol use: No  ?? Drug use: No  ? ? ? ?Medication list has been reviewed and updated. ? ?Current Meds  ?Medication Sig  ?? albuterol  (VENTOLIN HFA) 108 (90 Base) MCG/ACT inhaler 2 puffs four times a day as needed  ?? cabergoline (DOSTINEX) 0.5 MG tablet Take 1 tablet (0.5 mg total) by mouth 2 (two) times a week.  ?? escitalopram (LEXAPRO) 5 MG tablet Take 5 mg by mouth every morning.  ?? hydrOXYzine (VISTARIL) 50 MG capsule Take 50 mg by mouth 2 (two) times daily as needed.  ?? levothyroxine (SYNTHROID) 75 MCG tablet Take 1 tablet (75 mcg total) by mouth daily.  ?? lithium carbonate 300 MG capsule Take 1 capsule (300 mg total) by mouth 2 (two) times daily with a meal.  ?? magnesium oxide (MAG-OX) 400 MG tablet Take 400 mg by mouth daily.  ?? meloxicam (MOBIC) 7.5 MG tablet Take 7.5 mg by mouth daily.  ?? omeprazole (PRILOSEC) 20 MG capsule TAKE 1 CAPSULE (20 MG TOTAL) BY MOUTH DAILY.  ?? paliperidone (INVEGA) 6 MG 24 hr tablet Take 6 mg by mouth every morning.  ?? prazosin (MINIPRESS) 2 MG capsule Take 1 capsule (2 mg total) by mouth at bedtime.  ?? pregabalin (LYRICA) 50 MG capsule Take 50 mg by mouth 2 (two) times daily.  ? ? ?PHQ 2/9 Scores 03/05/2021 01/04/2021 12/01/2020  ?PHQ - 2 Score 0 0  0  ?PHQ- 9 Score 0 0 0  ? ? ?GAD 7 : Generalized Anxiety Score 03/05/2021 12/01/2020  ?Nervous, Anxious, on Edge 0 0  ?Control/stop worrying 0 0  ?Worry too much - different things 0 0  ?Trouble relaxing 0 0  ?Restless 0 0  ?Easily annoyed or irritable 0 0  ?Afraid - awful might happen 0 0  ?Total GAD 7 Score 0 0  ?Anxiety Difficulty Not difficult at all Not difficult at all  ? ? ?BP Readings from Last 3 Encounters:  ?07/30/21 112/78  ?07/01/21 121/89  ?06/25/21 128/84  ? ? ?Physical Exam ?Vitals and nursing note reviewed.  ?Constitutional:   ?   General: She is not in acute distress. ?   Appearance: She is well-developed.  ?HENT:  ?   Head: Normocephalic and atraumatic.  ?   Right Ear: Ear canal and external ear normal. Tympanic membrane is erythematous and retracted.  ?   Left Ear: Ear canal and external ear normal. Tympanic membrane is not erythematous or  retracted.  ?   Nose:  ?   Right Sinus: Maxillary sinus tenderness and frontal sinus tenderness present.  ?   Left Sinus: Maxillary sinus tenderness and frontal sinus tenderness present.  ?   Mouth/Throat:  ?   M

## 2021-08-05 DIAGNOSIS — R768 Other specified abnormal immunological findings in serum: Secondary | ICD-10-CM | POA: Diagnosis not present

## 2021-08-05 DIAGNOSIS — G43719 Chronic migraine without aura, intractable, without status migrainosus: Secondary | ICD-10-CM | POA: Diagnosis not present

## 2021-08-05 DIAGNOSIS — R9082 White matter disease, unspecified: Secondary | ICD-10-CM | POA: Diagnosis not present

## 2021-08-05 DIAGNOSIS — R292 Abnormal reflex: Secondary | ICD-10-CM | POA: Diagnosis not present

## 2021-08-09 ENCOUNTER — Other Ambulatory Visit: Payer: Self-pay | Admitting: Neurology

## 2021-08-09 DIAGNOSIS — G939 Disorder of brain, unspecified: Secondary | ICD-10-CM

## 2021-08-20 ENCOUNTER — Other Ambulatory Visit: Payer: Medicare Other

## 2021-08-23 ENCOUNTER — Ambulatory Visit
Admission: RE | Admit: 2021-08-23 | Discharge: 2021-08-23 | Disposition: A | Discharge status: Home / Self Care | Payer: Medicaid Other | Source: Ambulatory Visit | Attending: Neurology | Admitting: Neurology

## 2021-08-23 DIAGNOSIS — G939 Disorder of brain, unspecified: Secondary | ICD-10-CM | POA: Insufficient documentation

## 2021-08-23 IMAGING — MR MR CERVICAL SPINE WO/W CM
6 of 7 series · 34 of 48 positions shown · IV contrast (gadavist)
Comparison: None.

CLINICAL DATA: Abnormal MRI brain

EXAM:
MRI CERVICAL SPINE WITHOUT AND WITH CONTRAST
TECHNIQUE: Multiplanar and multiecho pulse sequences of the cervical spine, to
include the craniocervical junction and cervicothoracic junction,
were obtained without and with intravenous contrast.
CONTRAST:  10mL GADAVIST GADOBUTROL 1 MMOL/ML IV SOLN

[Series 5: T2 · sagittal · 3.0mm · 0.62mm/px · 3 of 15 slices shown (1 of 2)]
[im 1/15]
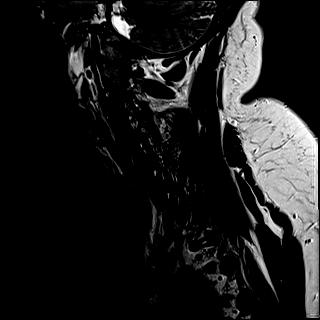
[im 8/15]
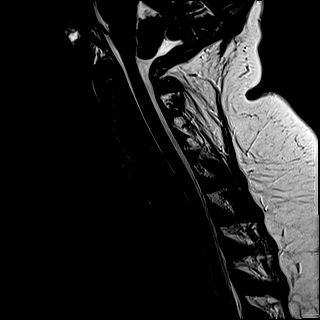
[im 15/15]
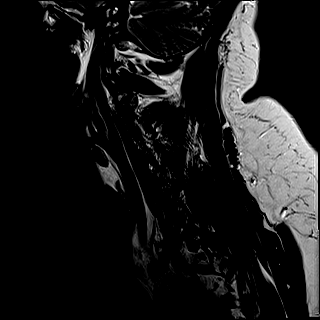

[Series 6: FLAIR · sagittal · 3.0mm · 0.78mm/px · 4 of 15 slices shown]
[im 1/15]
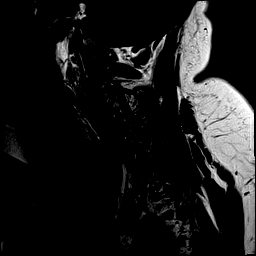
[im 5/15]
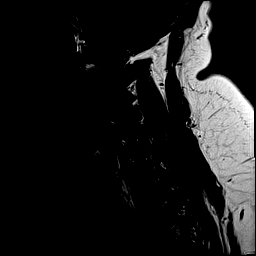
[im 10/15]
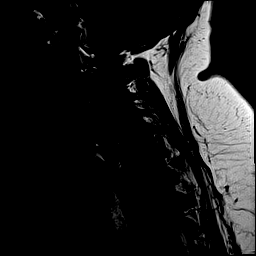
[im 15/15]
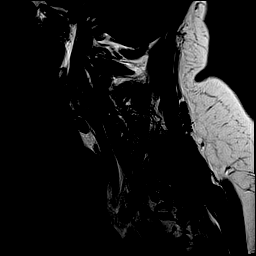

[Series 7: STIR · sagittal · 3.0mm · 0.62mm/px · 4 of 15 slices shown]
[im 1/15]
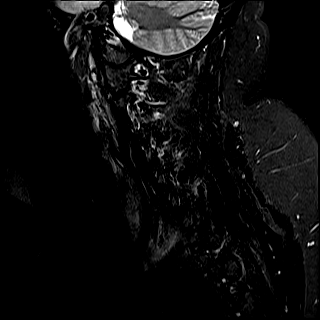
[im 5/15]
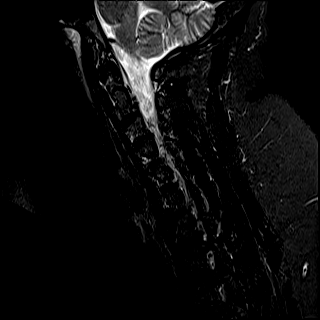
[im 10/15]
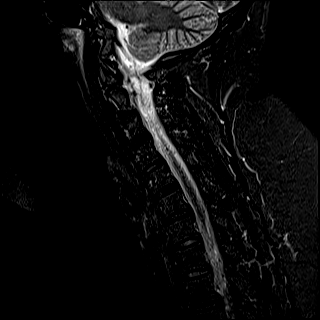
[im 15/15]
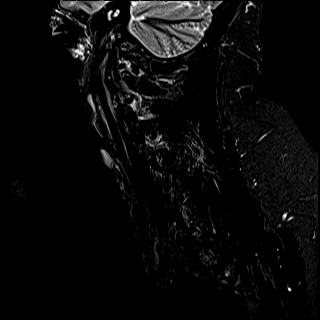

[Series 8: T2 · axial · 3.0mm · 0.70mm/px · z∈[-138,-3]mm · 11 of 42 slices shown (2 of 2)]
[im 1/42]
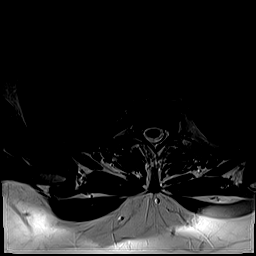
[im 5/42]
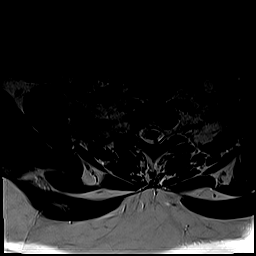
[im 9/42]
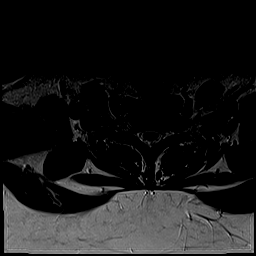
[im 13/42]
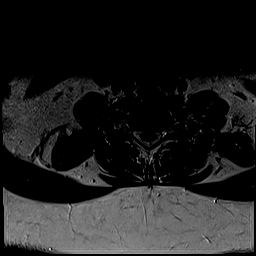
[im 17/42]
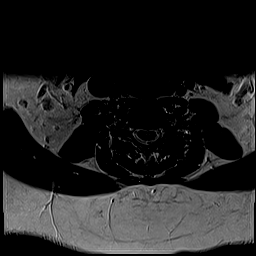
[im 21/42]
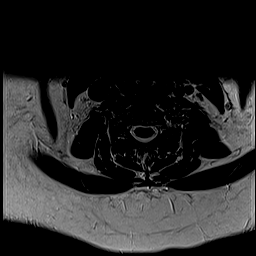
[im 25/42]
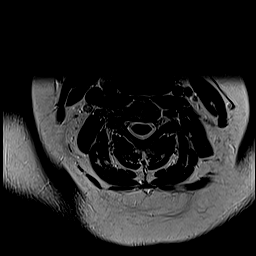
[im 29/42]
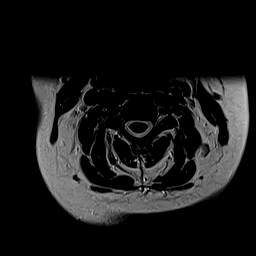
[im 33/42]
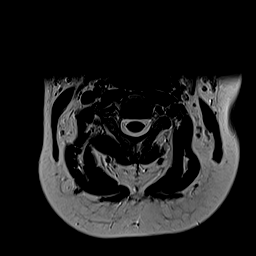
[im 37/42]
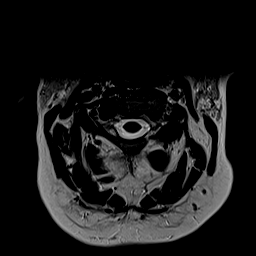
[im 42/42]
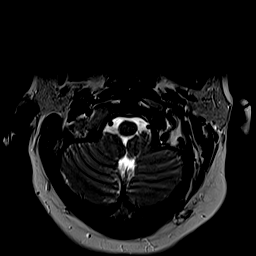

[Series 9: ax mpgr · axial · 3.0mm · 0.35mm/px · 1 of 42 slices shown]
[im 1/42]
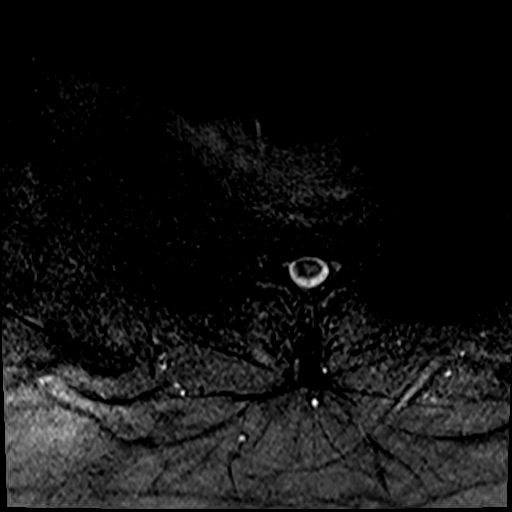

[Series 12: T1 post-contrast · axial · 3.0mm · 0.35mm/px · z∈[-138,-3]mm · 11 of 42 slices shown]
[im 1/42]
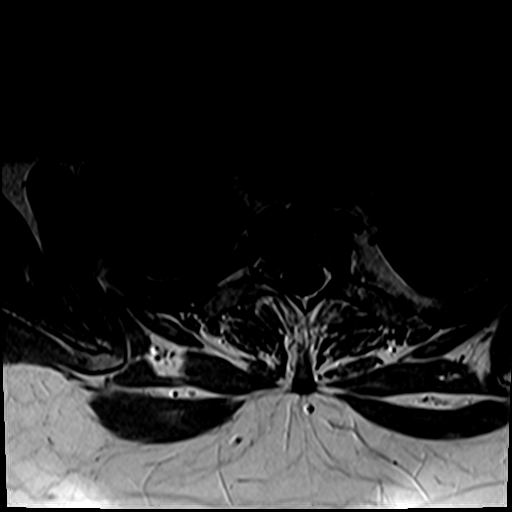
[im 5/42]
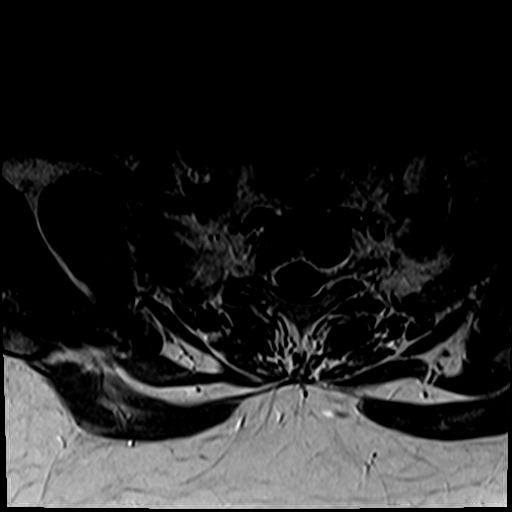
[im 9/42]
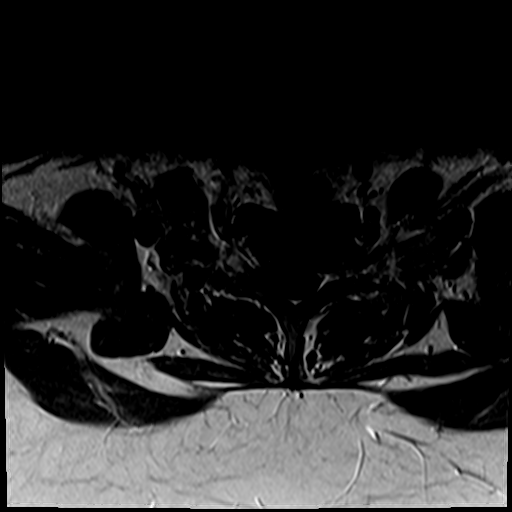
[im 13/42]
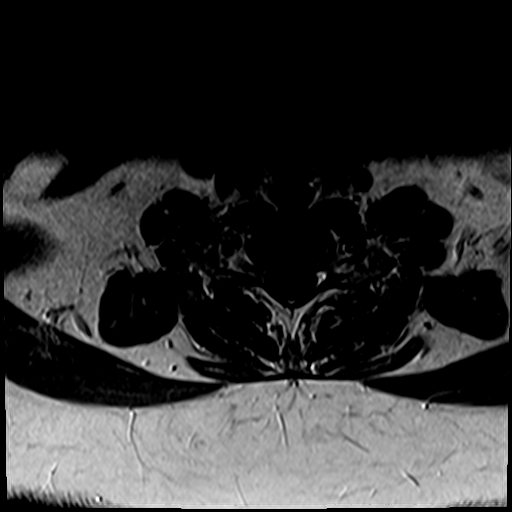
[im 17/42]
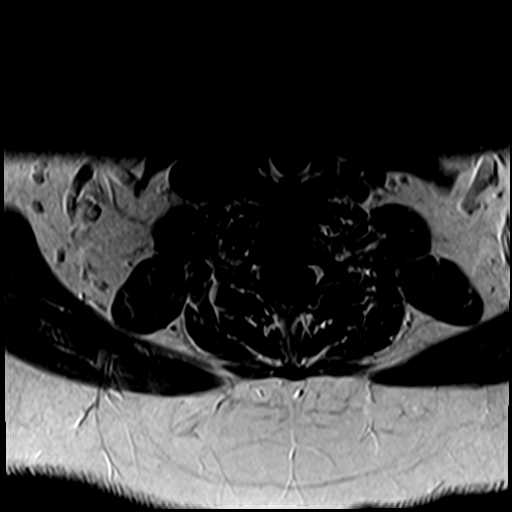
[im 21/42]
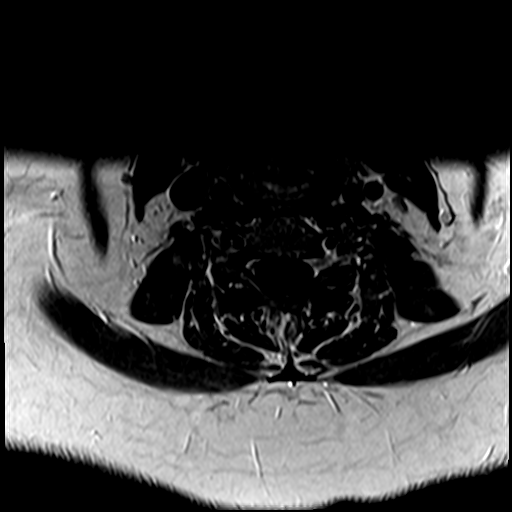
[im 25/42]
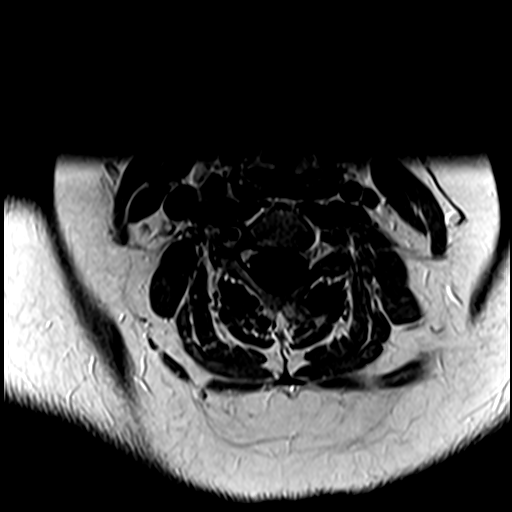
[im 29/42]
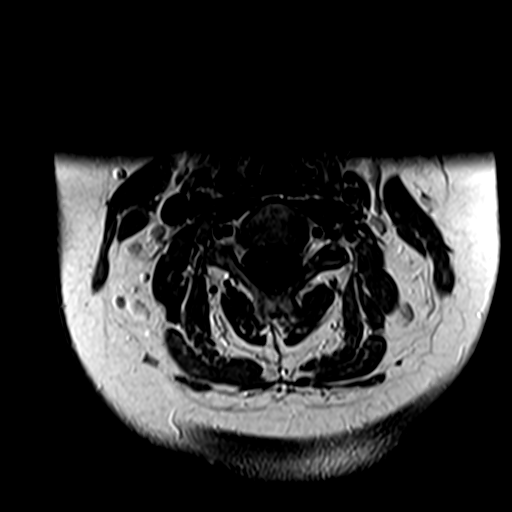
[im 33/42]
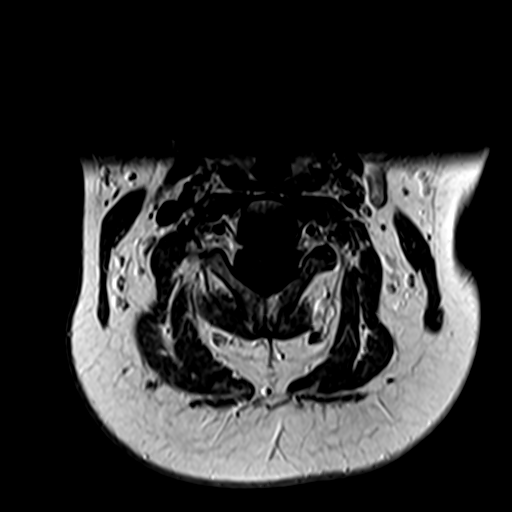
[im 37/42]
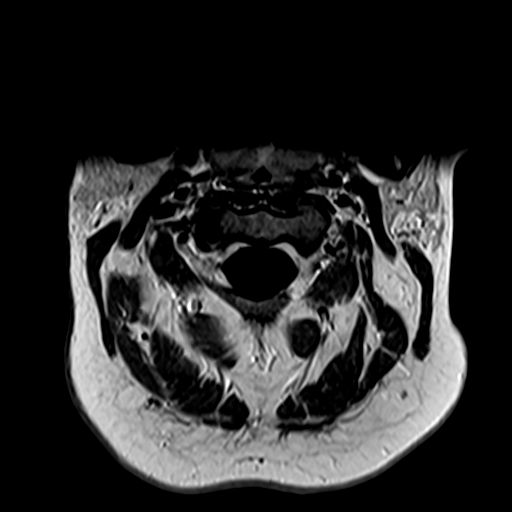
[im 42/42]
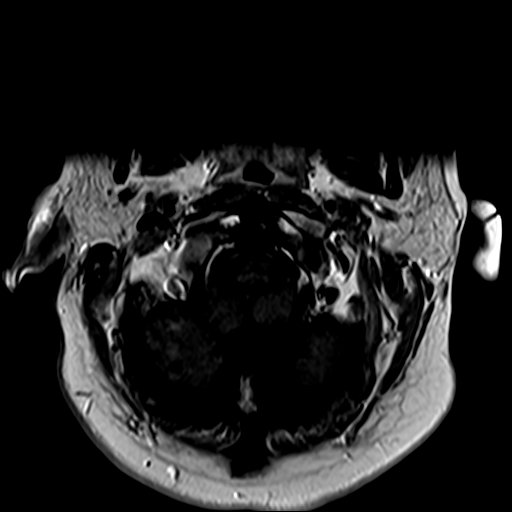

[34 of 48 positions shown; findings below may reference images not displayed]

FINDINGS: Alignment: Preserved.

Vertebrae: Vertebral body heights are maintained. There is no marrow
edema. No suspicious osseous lesion.

Cord: Normal caliber and signal.

Posterior Fossa, vertebral arteries, paraspinal tissues:
Unremarkable.

Disc levels:

C2-C3:  No canal or foraminal stenosis.

C3-C4: Minimal right uncovertebral hypertrophy. No canal or
foraminal stenosis.

C4-C5:  No canal or foraminal stenosis.

C5-C6:  Trace central protrusion.  No canal or foraminal stenosis.

C6-C7:  Trace central protrusion.  No canal or foraminal stenosis.

C7-T1: Small right foraminal protrusion. No canal or left foraminal
stenosis. Minor right foraminal stenosis.
IMPRESSION: No abnormal cord signal or enhancement.

Minor degenerative changes.

## 2021-08-23 MED ORDER — GADOBUTROL 1 MMOL/ML IV SOLN
10.0000 mL | Freq: Once | INTRAVENOUS | Status: AC | PRN
  Administered 2021-08-23: 10 mL via INTRAVENOUS

## 2021-08-25 ENCOUNTER — Encounter: Payer: Medicare Other | Admitting: Certified Nurse Midwife

## 2021-08-30 ENCOUNTER — Other Ambulatory Visit: Payer: Self-pay | Admitting: Internal Medicine

## 2021-08-30 DIAGNOSIS — K219 Gastro-esophageal reflux disease without esophagitis: Secondary | ICD-10-CM

## 2021-08-31 NOTE — Telephone Encounter (Signed)
Requested Prescriptions  ?Pending Prescriptions Disp Refills  ?? omeprazole (PRILOSEC) 20 MG capsule [Pharmacy Med Name: OMEPRAZOLE 20 MG CPDR 20 Capsule] 28 capsule 0  ?  Sig: TAKE 1 CAPSULE (20 MG TOTAL) BY MOUTH DAILY.  ?  ? Gastroenterology: Proton Pump Inhibitors Passed - 08/30/2021  3:15 PM  ?  ?  Passed - Valid encounter within last 12 months  ?  Recent Outpatient Visits   ?      ? 1 month ago Acute non-recurrent maxillary sinusitis  ? [facility] Glean Hess, MD  ? 5 months ago Hypoglycemia  ? [facility] Glean Hess, MD  ? 9 months ago Hypoglycemia  ? [facility] Glean Hess, MD  ?  ?  ?Future Appointments   ?        ? In 3 days Glean Hess, MD [facility], [place]  ? In 1 month Philip Aspen, CNM [facility]  ?  ? ?  ?  ?  ? ?

## 2021-09-02 ENCOUNTER — Other Ambulatory Visit: Payer: Self-pay | Admitting: Internal Medicine

## 2021-09-03 ENCOUNTER — Encounter: Payer: Self-pay | Admitting: Internal Medicine

## 2021-09-03 ENCOUNTER — Ambulatory Visit (INDEPENDENT_AMBULATORY_CARE_PROVIDER_SITE_OTHER): Payer: Medicaid Other | Admitting: Internal Medicine

## 2021-09-03 VITALS — BP 110/72 | HR 87 | Ht 67.0 in | Wt 305.0 lb

## 2021-09-03 DIAGNOSIS — J4520 Mild intermittent asthma, uncomplicated: Secondary | ICD-10-CM | POA: Diagnosis not present

## 2021-09-03 DIAGNOSIS — K219 Gastro-esophageal reflux disease without esophagitis: Secondary | ICD-10-CM

## 2021-09-03 DIAGNOSIS — F251 Schizoaffective disorder, depressive type: Secondary | ICD-10-CM | POA: Diagnosis not present

## 2021-09-03 MED ORDER — FLUTICASONE-SALMETEROL 45-21 MCG/ACT IN AERO: 2.0000 | INHALATION_SPRAY | Freq: Two times a day (BID) | RESPIRATORY_TRACT | 12 refills | Status: DC

## 2021-09-03 MED ORDER — ALBUTEROL SULFATE HFA 108 (90 BASE) MCG/ACT IN AERS: INHALATION_SPRAY | RESPIRATORY_TRACT | 1 refills | Status: DC

## 2021-09-03 NOTE — Progress Notes (Signed)
? ? ?Date:  09/03/2021  ? ?Name:  Melanie Bell   DOB:  1990-12-31   MRN:  [id number] ? ? ?Chief Complaint: Hypoglycemia and Gastroesophageal Reflux ? ?Gastroesophageal Reflux ?She complains of coughing, heartburn and wheezing. She reports no chest pain. This is a recurrent problem. The problem occurs occasionally. The heartburn is located in the substernum and back. The heartburn is of mild intensity. The heartburn does not wake her from sleep. Pertinent negatives include no fatigue. She has tried a PPI (omeprazole once a day) for the symptoms.  ?Asthma ?She complains of chest tightness, cough and wheezing. This is a recurrent problem. The problem occurs 2 to 4 times per day. The problem has been unchanged. The cough is non-productive. Associated symptoms include heartburn. Pertinent negatives include no chest pain, fever, headaches or trouble swallowing. Her symptoms are aggravated by pollen and change in weather. Her symptoms are alleviated by beta-agonist. She reports moderate (but only temporary) improvement on treatment. Her past medical history is significant for asthma.  ? ?Lab Results  ?Component Value Date  ? NA 140 06/25/2021  ? K 4.6 06/25/2021  ? CO2 32 06/25/2021  ? GLUCOSE 89 06/25/2021  ? BUN 15 06/25/2021  ? CREATININE 0.80 06/25/2021  ? CALCIUM 9.9 06/25/2021  ? GFRNONAA >60 01/31/2018  ? ?Lab Results  ?Component Value Date  ? CHOL 142 07/01/2019  ? HDL 50 07/01/2019  ? [place] 79 07/01/2019  ? TRIG 63 07/01/2019  ? CHOLHDL 2.8 07/01/2019  ? ?Lab Results  ?Component Value Date  ? TSH 1.13 06/25/2021  ? ?Lab Results  ?Component Value Date  ? HGBA1C 5.1 11/23/2017  ? ?Lab Results  ?Component Value Date  ? WBC 5.9 02/09/2018  ? HGB 12.3 02/09/2018  ? HCT 36.0 02/09/2018  ? MCV 91.6 02/09/2018  ? PLT 205 02/09/2018  ? ?Lab Results  ?Component Value Date  ? ALT 7 06/25/2021  ? AST 13 06/25/2021  ? ALKPHOS 76 06/25/2021  ? BILITOT 0.4 06/25/2021  ? ?No results found for: 25OHVITD2, [place], VD25OH   ? ?Review of Systems  ?Constitutional:  Negative for chills, fatigue and fever.  ?HENT:  Negative for trouble swallowing.   ?Respiratory:  Positive for cough and wheezing.   ?Cardiovascular:  Negative for chest pain and palpitations.  ?Gastrointestinal:  Positive for heartburn. Negative for blood in stool, constipation and diarrhea.  ?Allergic/Immunologic: Positive for environmental allergies.  ?Neurological:  Negative for light-headedness and headaches.  ?Psychiatric/Behavioral:  Negative for dysphoric mood and sleep disturbance. The patient is not nervous/anxious.   ? ?Patient Active Problem List  ? Diagnosis Date Noted  ? Acquired hypothyroidism 06/25/2021  ? Migraine without aura and without status migrainosus, not intractable 03/05/2021  ? Mild intermittent asthma without complication 14/48/1856  ? Gastroesophageal reflux disease 12/01/2020  ? Hypoglycemia 12/01/2020  ? Rheumatoid factor positive 12/09/2019  ? SS-A antibody positive 10/28/2019  ? Chronic pain of both knees 10/28/2019  ? Hyperprolactinemia ([place]) 08/02/2019  ? Schizoaffective disorder, depressive type ([place]) 02/02/2018  ? Suicide attempt ([place]) 01/16/2018  ? Dysfunctional uterine bleeding 01/06/2018  ? Borderline personality disorder ([place]) 01/04/2018  ? PTSD (post-traumatic stress disorder) 01/03/2018  ? ASCUS of cervix with negative high risk HPV 08/28/2017  ? Malingering 05/06/2016  ? Hypertension 09/29/2014  ? ? ?Allergies  ?Allergen Reactions  ? Betadine [Povidone Iodine] Other (See Comments)  ?  Reaction:  Unknown   ? Fish Allergy Hives  ? Iodine Rash  ? Shellfish-Derived Products Other (See Comments)  ?  Reaction:  Unknown   ? ? ?Past Surgical History:  ?Procedure Laterality Date  ? ABDOMINAL SURGERY    ? "years ago" to remove foreign objects  ? APPENDECTOMY    ? BREAST EXCISIONAL BIOPSY Right 09/03/2007  ? surgical bx removed fibroadeoma  ? BREAST LUMPECTOMY Right   ? COLONOSCOPY WITH PROPOFOL N/A 09/10/2015  ? Procedure: COLONOSCOPY WITH  PROPOFOL;  Surgeon: [name], MD;  Location: [facility] ENDOSCOPY;  Service: Endoscopy;  Laterality: N/A;  ? ESOPHAGOGASTRODUODENOSCOPY N/A 11/28/2014  ? Procedure: ESOPHAGOGASTRODUODENOSCOPY (EGD);  Surgeon: [name], MD;  Location: [facility] ENDOSCOPY;  Service: Endoscopy;  Laterality: N/A;  ? ESOPHAGOGASTRODUODENOSCOPY N/A 02/21/2016  ? Procedure: ESOPHAGOGASTRODUODENOSCOPY (EGD);  Surgeon: Mauri Pole, MD;  Location: [facility] ENDOSCOPY;  Service: Endoscopy;  Laterality: N/A;  ? ESOPHAGOGASTRODUODENOSCOPY N/A 04/21/2016  ? Procedure: ESOPHAGOGASTRODUODENOSCOPY (EGD);  Surgeon: Gatha Mayer, MD;  Location: [facility] ENDOSCOPY;  Service: Endoscopy;  Laterality: N/A;  ? ESOPHAGOGASTRODUODENOSCOPY N/A 05/06/2016  ? Procedure: ESOPHAGOGASTRODUODENOSCOPY (EGD);  Surgeon: Jonathon Bellows, MD;  Location: [facility] ENDOSCOPY;  Service: Gastroenterology;  Laterality: N/A;  ? ESOPHAGOGASTRODUODENOSCOPY N/A 06/13/2016  ? Procedure: ESOPHAGOGASTRODUODENOSCOPY (EGD);  Surgeon: [name], MD;  Location: [facility] ENDOSCOPY;  Service: Endoscopy;  Laterality: N/A;  ? ESOPHAGOGASTRODUODENOSCOPY (EGD) WITH PROPOFOL N/A 02/29/2016  ? Procedure: ESOPHAGOGASTRODUODENOSCOPY (EGD) WITH PROPOFOL;  Surgeon: [name], MD;  Location: [facility] ENDOSCOPY;  Service: Endoscopy;  Laterality: N/A;  ? ESOPHAGOGASTRODUODENOSCOPY (EGD) WITH PROPOFOL N/A 01/15/2018  ? Procedure: ESOPHAGOGASTRODUODENOSCOPY (EGD) WITH PROPOFOL;  Surgeon: Daneil Dolin, MD;  Location: [facility] ENDO SUITE;  Service: Endoscopy;  Laterality: N/A;  retrieval forein body  ? LAPAROTOMY N/A 09/12/2015  ? Procedure: EXPLORATORY LAPAROTOMY;  Surgeon: Florene Glen, MD;  Location: [facility] ORS;  Service: General;  Laterality: N/A;  ? SIGMOIDOSCOPY N/A 09/12/2015  ? Procedure: SIGMOIDOSCOPY;  Surgeon: Florene Glen, MD;  Location: [facility] ORS;  Service: General;  Laterality: N/A;  ? WISDOM TOOTH EXTRACTION    ? ? ?Social History  ? ?Tobacco Use  ? Smoking status: Former  ?  Packs/day: 0.50  ?   Years: 3.00  ?  Pack years: 1.50  ?  Types: Cigarettes  ?  Quit date: 08/14/2016  ?  Years since quitting: 5.0  ? Smokeless tobacco: Never  ?Vaping Use  ? Vaping Use: Never used  ?Substance Use Topics  ? Alcohol use: No  ? Drug use: No  ? ? ? ?Medication list has been reviewed and updated. ? ?Current Meds  ?Medication Sig  ? albuterol (VENTOLIN HFA) 108 (90 Base) MCG/ACT inhaler 2 puffs four times a day as needed  ? cabergoline (DOSTINEX) 0.5 MG tablet Take 1 tablet (0.5 mg total) by mouth 2 (two) times a week.  ? EMGALITY 120 MG/ML SOSY Inject into the skin.  ? escitalopram (LEXAPRO) 10 MG tablet Take 10 mg by mouth daily.  ? hydrOXYzine (VISTARIL) 50 MG capsule Take 50 mg by mouth 2 (two) times daily as needed.  ? levothyroxine (SYNTHROID) 75 MCG tablet Take 1 tablet (75 mcg total) by mouth daily.  ? lithium carbonate 300 MG capsule Take 1 capsule (300 mg total) by mouth 2 (two) times daily with a meal.  ? magnesium oxide (MAG-OX) 400 MG tablet Take 400 mg by mouth daily.  ? meloxicam (MOBIC) 7.5 MG tablet Take 7.5 mg by mouth daily.  ? omeprazole (PRILOSEC) 20 MG capsule TAKE 1 CAPSULE (20 MG TOTAL) BY MOUTH DAILY.  ? paliperidone (INVEGA) 6 MG 24 hr tablet Take 6 mg  by mouth every morning.  ? prazosin (MINIPRESS) 2 MG capsule Take 1 capsule (2 mg total) by mouth at bedtime.  ? pregabalin (LYRICA) 50 MG capsule Take 50 mg by mouth 2 (two) times daily.  ? ? ? ?  09/03/2021  ?  9:15 AM 03/05/2021  ?  9:04 AM 12/01/2020  ?  2:53 PM  ?GAD 7 : Generalized Anxiety Score  ?Nervous, Anxious, on Edge 0 0 0  ?Control/stop worrying 0 0 0  ?Worry too much - different things 0 0 0  ?Trouble relaxing 0 0 0  ?Restless 0 0 0  ?Easily annoyed or irritable 0 0 0  ?Afraid - awful might happen 0 0 0  ?Total GAD 7 Score 0 0 0  ?Anxiety Difficulty  Not difficult at all Not difficult at all  ? ? ? ?  09/03/2021  ?  9:15 AM  ?Depression screen PHQ 2/9  ?Decreased Interest 0  ?Down, Depressed, Hopeless 0  ?PHQ - 2 Score 0  ?Altered sleeping 0   ?Tired, decreased energy 0  ?Change in appetite 0  ?Feeling bad or failure about yourself  0  ?Trouble concentrating 0  ?Moving slowly or fidgety/restless 0  ?Suicidal thoughts 0  ?PHQ-9 Score 0  ?Diffi

## 2021-09-15 ENCOUNTER — Encounter: Payer: Medicare Other | Admitting: Certified Nurse Midwife

## 2021-09-30 ENCOUNTER — Other Ambulatory Visit: Payer: Self-pay | Admitting: Internal Medicine

## 2021-09-30 ENCOUNTER — Ambulatory Visit (INDEPENDENT_AMBULATORY_CARE_PROVIDER_SITE_OTHER): Payer: Medicaid Other | Admitting: Certified Nurse Midwife

## 2021-09-30 ENCOUNTER — Encounter: Payer: Self-pay | Admitting: Certified Nurse Midwife

## 2021-09-30 VITALS — BP 121/71 | HR 61 | Ht 66.0 in | Wt 308.0 lb

## 2021-09-30 DIAGNOSIS — Z13220 Encounter for screening for lipoid disorders: Secondary | ICD-10-CM | POA: Diagnosis not present

## 2021-09-30 DIAGNOSIS — Z0000 Encounter for general adult medical examination without abnormal findings: Secondary | ICD-10-CM | POA: Diagnosis not present

## 2021-09-30 DIAGNOSIS — Z01419 Encounter for gynecological examination (general) (routine) without abnormal findings: Secondary | ICD-10-CM

## 2021-09-30 DIAGNOSIS — K219 Gastro-esophageal reflux disease without esophagitis: Secondary | ICD-10-CM

## 2021-09-30 NOTE — Progress Notes (Signed)
GYNECOLOGY ANNUAL PREVENTATIVE CARE ENCOUNTER NOTE  History:     Melanie Bell is a 31 y.o. G37P0030 female here for a routine annual gynecologic exam.  Current complaints: none.   Denies abnormal vaginal bleeding, discharge, pelvic pain, problems with intercourse or other gynecologic concerns.     Social Relationship: married Living: husband and step son Work: none  Exercise: 1-2 time wk 30-45 min Smoke/Alcohol/drug use: denies use   Gynecologic History Patient's last menstrual period was 09/20/2021 (approximate). Contraception: none Last Pap: 09/10/19. Results were: normal with negative HPV Last mammogram: n/a .   Obstetric History OB History  Gravida Para Term Preterm AB Living  4 0 0 0 3 0  SAB IAB Ectopic Multiple Live Births  3 0 0 0      # Outcome Date GA Lbr Len/2nd Weight Sex Delivery Anes PTL Lv  4 Gravida           3 SAB 2021          2 SAB 2021          1 SAB 2021            Past Medical History:  Diagnosis Date   Allergy    Anxiety    Arthritis    Asthma    Depression    Foreign body aspiration    GERD (gastroesophageal reflux disease)    Hallucinations 09/30/2014   Sizoaffective   Hyperlipidemia    Hypertension    Intentional ingestion of batteries 04/21/2016    2 AAA batteries and 1 thumb tack; intent to hurt herself/notes 65/10/8125   Self-inflicted injury 51/70/0174   Suicidal ideation 01/02/2018   Tardive dyskinesia 10/2014   recent onset   Thyroid disease     Past Surgical History:  Procedure Laterality Date   ABDOMINAL SURGERY     "years ago" to remove foreign objects   APPENDECTOMY     BREAST EXCISIONAL BIOPSY Right 09/03/2007   surgical bx removed fibroadeoma   BREAST LUMPECTOMY Right    COLONOSCOPY WITH PROPOFOL N/A 09/10/2015   Procedure: COLONOSCOPY WITH PROPOFOL;  Surgeon: [name], MD;  Location: [facility] ENDOSCOPY;  Service: Endoscopy;  Laterality: N/A;   ESOPHAGOGASTRODUODENOSCOPY N/A 11/28/2014   Procedure:  ESOPHAGOGASTRODUODENOSCOPY (EGD);  Surgeon: [name], MD;  Location: [facility] ENDOSCOPY;  Service: Endoscopy;  Laterality: N/A;   ESOPHAGOGASTRODUODENOSCOPY N/A 02/21/2016   Procedure: ESOPHAGOGASTRODUODENOSCOPY (EGD);  Surgeon: Mauri Pole, MD;  Location: [facility] ENDOSCOPY;  Service: Endoscopy;  Laterality: N/A;   ESOPHAGOGASTRODUODENOSCOPY N/A 04/21/2016   Procedure: ESOPHAGOGASTRODUODENOSCOPY (EGD);  Surgeon: Gatha Mayer, MD;  Location: [facility] ENDOSCOPY;  Service: Endoscopy;  Laterality: N/A;   ESOPHAGOGASTRODUODENOSCOPY N/A 05/06/2016   Procedure: ESOPHAGOGASTRODUODENOSCOPY (EGD);  Surgeon: Jonathon Bellows, MD;  Location: [facility] ENDOSCOPY;  Service: Gastroenterology;  Laterality: N/A;   ESOPHAGOGASTRODUODENOSCOPY N/A 06/13/2016   Procedure: ESOPHAGOGASTRODUODENOSCOPY (EGD);  Surgeon: [name], MD;  Location: [facility] ENDOSCOPY;  Service: Endoscopy;  Laterality: N/A;   ESOPHAGOGASTRODUODENOSCOPY (EGD) WITH PROPOFOL N/A 02/29/2016   Procedure: ESOPHAGOGASTRODUODENOSCOPY (EGD) WITH PROPOFOL;  Surgeon: [name], MD;  Location: [facility] ENDOSCOPY;  Service: Endoscopy;  Laterality: N/A;   ESOPHAGOGASTRODUODENOSCOPY (EGD) WITH PROPOFOL N/A 01/15/2018   Procedure: ESOPHAGOGASTRODUODENOSCOPY (EGD) WITH PROPOFOL;  Surgeon: Daneil Dolin, MD;  [facility] ENDO [facility]ITE;  Service: Endoscopy;  Laterality: N/A;  retrieval forein body   LAPAROTOMY N/A 09/12/2015   Procedure: EXPLORATORY LAPAROTOMY;  Surgeon: Florene Glen, MD;  Location: [facility] ORS;  Service: General;  Laterality: N/A;  SIGMOIDOSCOPY N/A 09/12/2015   Procedure: SIGMOIDOSCOPY;  Surgeon: Florene Glen, MD;  Location: [facility] ORS;  Service: General;  Laterality: N/A;   WISDOM TOOTH EXTRACTION      Current Outpatient Medications on File Prior to Visit  Medication Sig Dispense Refill   albuterol (VENTOLIN HFA) 108 (90 Base) MCG/ACT inhaler 2 puffs four times a day as needed 18 g 1   cabergoline (DOSTINEX) 0.5 MG tablet Take 1 tablet (0.5 mg  total) by mouth 2 (two) times a week. 26 tablet 2   EMGALITY 120 MG/ML SOSY Inject into the skin.     escitalopram (LEXAPRO) 10 MG tablet Take 10 mg by mouth daily.     fluticasone-salmeterol (ADVAIR HFA) 45-21 MCG/ACT inhaler Inhale 2 puffs into the lungs 2 (two) times daily. 1 each 12   hydrOXYzine (VISTARIL) 50 MG capsule Take 50 mg by mouth 2 (two) times daily as needed.     levothyroxine (SYNTHROID) 75 MCG tablet Take 1 tablet (75 mcg total) by mouth daily. 90 tablet 3   lithium carbonate 300 MG capsule Take 1 capsule (300 mg total) by mouth 2 (two) times daily with a meal. 60 capsule 1   magnesium oxide (MAG-OX) 400 MG tablet Take 400 mg by mouth daily.     meloxicam (MOBIC) 7.5 MG tablet Take 7.5 mg by mouth daily.     omeprazole (PRILOSEC) 20 MG capsule TAKE 1 CAPSULE (20 MG TOTAL) BY MOUTH DAILY. 28 capsule 0   paliperidone (INVEGA) 6 MG 24 hr tablet Take 6 mg by mouth every morning.     prazosin (MINIPRESS) 2 MG capsule Take 1 capsule (2 mg total) by mouth at bedtime. 30 capsule 0   pregabalin (LYRICA) 50 MG capsule Take 50 mg by mouth 2 (two) times daily.     No current facility-administered medications on file prior to visit.    Allergies  Allergen Reactions   Betadine [Povidone Iodine] Other (See Comments)    Reaction:  Unknown    Fish Allergy Hives   Iodine Rash   Shellfish-Derived Products Other (See Comments)    Reaction:  Unknown     Social History:  reports that she quit smoking about 5 years ago. Her smoking use included cigarettes. She has a 1.50 pack-year smoking history. She has never used smokeless tobacco. She reports that she does not drink alcohol and does not use drugs.  Family History  Problem Relation Age of Onset   Depression Mother    Hypertension Mother    Sleep apnea Mother    Asthma Mother    COPD Mother    Diabetes Mother    Anxiety disorder Mother    Arthritis Mother    Breast cancer Maternal Aunt        20's   Stroke Maternal Grandmother      The following portions of the patient's history were reviewed and updated as appropriate: allergies, current medications, past family history, past medical history, past social history, past surgical history and problem list.  Review of Systems Pertinent items noted in HPI and remainder of comprehensive ROS otherwise negative.  Physical Exam:  BP 121/71   Pulse 61   Ht '5\' 6"'$  (1.676 m)   Wt (!) 308 lb (139.7 kg)   LMP 09/20/2021 (Approximate)   BMI 49.71 kg/m  CONSTITUTIONAL: Well-developed, well-nourished , morbid obese, female in no acute distress.  HENT:  Normocephalic, atraumatic, External right and left ear normal. Oropharynx is clear and moist EYES: Conjunctivae and EOM  are normal. Pupils are equal, round, and reactive to light. No scleral icterus.  NECK: Normal range of motion, supple, no masses.  Normal thyroid.  SKIN: Skin is warm and dry. No rash noted. Not diaphoretic. No erythema. No pallor. MUSCULOSKELETAL: Normal range of motion. No tenderness.  No cyanosis, clubbing, or edema.  2+ distal pulses. NEUROLOGIC: Alert and oriented to person, place, and time. Normal reflexes, muscle tone coordination.  PSYCHIATRIC: Normal mood and affect. Normal behavior. Normal judgment and thought content. CARDIOVASCULAR: Normal heart rate noted, regular rhythm RESPIRATORY: Clear to auscultation bilaterally. Effort and breath sounds normal, no problems with respiration noted. BREASTS: Symmetric in size. No masses, tenderness, skin changes, nipple drainage, or lymphadenopathy bilaterally.  ABDOMEN: Soft, no distention noted.  No tenderness, rebound or guarding.  PELVIC: Normal appearing external genitalia and urethral meatus; normal appearing vaginal mucosa and cervix.  No abnormal discharge noted.  Pap smear not due.  uterine size- difficult to assess, no other palpable masses, no uterine or adnexal tenderness.Difficult to assess due to body habitus.   Assessment and Plan:    1. Women's  annual routine gynecological examination  Pap: not due Mammogram : n/a  Labs: lipid profile Refills: none  Referral: none  Routine preventative health maintenance measures emphasized. Please refer to After Visit Summary for other counseling recommendations.      Philip Aspen, CNM [facility]

## 2021-09-30 NOTE — Telephone Encounter (Signed)
Requested Prescriptions  Pending Prescriptions Disp Refills  . omeprazole (PRILOSEC) 20 MG capsule [Pharmacy Med Name: OMEPRAZOLE 20 MG CPDR 20 Capsule] 90 capsule 1    Sig: TAKE 1 CAPSULE (20 MG TOTAL) BY MOUTH DAILY.     Gastroenterology: Proton Pump Inhibitors Passed - 09/30/2021  2:35 PM      Passed - Valid encounter within last 12 months    Recent Outpatient Visits          3 weeks ago Mild intermittent asthma without complication   [facility] Glean Hess, MD   2 months ago Acute non-recurrent maxillary sinusitis   [facility] Glean Hess, MD   6 months ago Hypoglycemia   [facility] Glean Hess, MD   10 months ago Hypoglycemia   [facility] Glean Hess, MD

## 2021-10-01 LAB — LIPID PANEL
Chol/HDL Ratio: 4.9 ratio — ABNORMAL HIGH (ref 0.0–4.4)
Cholesterol, Total: 170 mg/dL (ref 100–199)
HDL: 35 mg/dL — ABNORMAL LOW (ref >39)
LDL Chol Calc (NIH): 109 mg/dL — ABNORMAL HIGH (ref 0–99)
Triglycerides: 145 mg/dL (ref 0–149)
VLDL Cholesterol Cal: 26 mg/dL (ref 5–40)

## 2021-10-19 ENCOUNTER — Telehealth: Payer: Self-pay | Admitting: Certified Nurse Midwife

## 2021-10-19 NOTE — Telephone Encounter (Signed)
Pt states her period is 7 days late and she is having some very light spotting , possible implantation with " white flakes". Pt states " flakes look like skin substance " . Pt states she is not in pain but concerned. Pt would like someone to return her call.

## 2021-10-22 NOTE — Telephone Encounter (Signed)
LM with patient with instructions to take at home UPT and let us know of results.

## 2021-10-29 ENCOUNTER — Ambulatory Visit: Payer: Medicare Other | Admitting: Internal Medicine

## 2021-11-01 ENCOUNTER — Encounter: Payer: Self-pay | Admitting: Internal Medicine

## 2021-11-01 ENCOUNTER — Ambulatory Visit (INDEPENDENT_AMBULATORY_CARE_PROVIDER_SITE_OTHER): Payer: Medicaid Other | Admitting: Internal Medicine

## 2021-11-01 VITALS — BP 120/70 | HR 78 | Ht 66.0 in | Wt 308.0 lb

## 2021-11-01 DIAGNOSIS — E039 Hypothyroidism, unspecified: Secondary | ICD-10-CM

## 2021-11-01 DIAGNOSIS — E221 Hyperprolactinemia: Secondary | ICD-10-CM | POA: Diagnosis not present

## 2021-11-01 LAB — BASIC METABOLIC PANEL
BUN: 14 mg/dL (ref 6–23)
CO2: 27 mEq/L (ref 19–32)
Calcium: 9.8 mg/dL (ref 8.4–10.5)
Chloride: 107 mEq/L (ref 96–112)
Creatinine, Ser: 0.72 mg/dL (ref 0.40–1.20)
GFR: 110.63 mL/min (ref >60.00)
Glucose, Bld: 97 mg/dL (ref 70–99)
Potassium: 4.5 mEq/L (ref 3.5–5.1)
Sodium: 139 mEq/L (ref 135–145)

## 2021-11-01 LAB — TSH: TSH: 1.75 u[IU]/mL (ref 0.35–5.50)

## 2021-11-01 NOTE — Progress Notes (Unsigned)
[name]  MRN/ DOB: [id number], [birth year]    Age/ Sex: 31 y.o., female     PCP: Glean Hess, MD   Reason for Endocrinology Evaluation: Hyperprolactinemia      Initial Endocrinology Clinic Visit: 08/02/2019    PATIENT IDENTIFIER: Melanie Bell is a 31 y.o., female with a past medical history of Bipolar disorder and anxiety. She has followed with [facility] since 08/02/2019 for consultative assistance with management of her hyperprolactinemia .   HISTORICAL SUMMARY:  Pt was noted to have an elevated prolactin level at 66.3 ng/mL, during evaluation of secondary amenorrhea and galactorrhea in 06/2019.    She does endorse history of elevated prolactin since her teenage years.  She has been diagnosed with bipolar disorder, PTSD and anxiety and has been on multiple antipsychotic medications over the years.  She has been on ox carbamazepine as well as risperidone in the past.   Prolactin on initial presentation to our clinic was 40.2 ng/mL, with normal LH, FSH, Testosterone and TSH    An MRI was obtained 09/09/2019 which demonstrated normal pituitary but abnormal white matter.  Pt was advised to discuss switching Invega with psych as it has been associated with elevated prolactin .    She was seen by Dr. Tomi Likens ( Neurology for abnormal white matter)   Sister  And mother with hypothyroidism and  Sister with hyperthyroidism    She was started on cabergoline 04/2021 with elevated prolactin again with a max level of 62.7 ng/mL    HYPOTHYROID HISTORY:  Patient reports being on LT-4 replacement therapy since her teenage years, she had ran out of her medication by 06/2019 and was advised not to restart since her TFTs were normal. Repeat TSH elevated in 07/2019 was elevated at 4.77 uIU/mL. And LT-4 replacement restarted.     Got married 1/7th 2023 She does not have a guardian anymore, reinstated her rights 07/2020   SUBJECTIVE:    Today  (12/20/2019):  Melanie Bell is here for hyperprolactinemia and hypothyroidism.     LMP  earlier this month, has menstruation regularly  Has chronic constipation - uses miralax  Has been having nausea  with 2 episodes of vomiting, denies fever , no sick contacts  Denies local neck symptoms  Denies palpitations  Denies Galactorrhea  Has stable migraine - follows with neurology   Starting cosmetology school in 12/2020  Medications Levothyroxine 75 mcg daily  Carbergoline 0.5 twice weekly ( Wednesday and Sunday ) She has Gynecology Philip Aspen  with [facility] )        HISTORY:  Past Medical History:  Past Medical History:  Diagnosis Date   Allergy    Anxiety    Arthritis    Asthma    Depression    Foreign body aspiration    GERD (gastroesophageal reflux disease)    Hallucinations 09/30/2014   Sizoaffective   Hyperlipidemia    Hypertension    Intentional ingestion of batteries 04/21/2016    2 AAA batteries and 1 thumb tack; intent to hurt herself/notes 99/07/5699   Self-inflicted injury 77/93/9030   Suicidal ideation 01/02/2018   Tardive dyskinesia 10/2014   recent onset   Thyroid disease    Past Surgical History:  Past Surgical History:  Procedure Laterality Date   ABDOMINAL SURGERY     "years ago" to remove foreign objects   APPENDECTOMY     BREAST EXCISIONAL BIOPSY Right 09/03/2007   surgical bx removed fibroadeoma  BREAST LUMPECTOMY Right    COLONOSCOPY WITH PROPOFOL N/A 09/10/2015   Procedure: COLONOSCOPY WITH PROPOFOL;  Surgeon: [name], MD;  Location: [facility] ENDOSCOPY;  Service: Endoscopy;  Laterality: N/A;   ESOPHAGOGASTRODUODENOSCOPY N/A 11/28/2014   Procedure: ESOPHAGOGASTRODUODENOSCOPY (EGD);  Surgeon: [name], MD;  Location: [facility] ENDOSCOPY;  Service: Endoscopy;  Laterality: N/A;   ESOPHAGOGASTRODUODENOSCOPY N/A 02/21/2016   Procedure: ESOPHAGOGASTRODUODENOSCOPY (EGD);  Surgeon: Mauri Pole, MD;  Location: [facility] ENDOSCOPY;   Service: Endoscopy;  Laterality: N/A;   ESOPHAGOGASTRODUODENOSCOPY N/A 04/21/2016   Procedure: ESOPHAGOGASTRODUODENOSCOPY (EGD);  Surgeon: Gatha Mayer, MD;  Location: [facility] ENDOSCOPY;  Service: Endoscopy;  Laterality: N/A;   ESOPHAGOGASTRODUODENOSCOPY N/A 05/06/2016   Procedure: ESOPHAGOGASTRODUODENOSCOPY (EGD);  Surgeon: Jonathon Bellows, MD;  Location: [facility] ENDOSCOPY;  Service: Gastroenterology;  Laterality: N/A;   ESOPHAGOGASTRODUODENOSCOPY N/A 06/13/2016   Procedure: ESOPHAGOGASTRODUODENOSCOPY (EGD);  Surgeon: [name], MD;  Location: [facility] ENDOSCOPY;  Service: Endoscopy;  Laterality: N/A;   ESOPHAGOGASTRODUODENOSCOPY (EGD) WITH PROPOFOL N/A 02/29/2016   Procedure: ESOPHAGOGASTRODUODENOSCOPY (EGD) WITH PROPOFOL;  Surgeon: [name], MD;  Location: [facility] ENDOSCOPY;  Service: Endoscopy;  Laterality: N/A;   ESOPHAGOGASTRODUODENOSCOPY (EGD) WITH PROPOFOL N/A 01/15/2018   Procedure: ESOPHAGOGASTRODUODENOSCOPY (EGD) WITH PROPOFOL;  Surgeon: Daneil Dolin, MD;  Location: [facility] ENDO SUITE;  Service: Endoscopy;  Laterality: N/A;  retrieval forein body   LAPAROTOMY N/A 09/12/2015   Procedure: EXPLORATORY LAPAROTOMY;  Surgeon: Florene Glen, MD;  Location: [facility] ORS;  Service: General;  Laterality: N/A;   SIGMOIDOSCOPY N/A 09/12/2015   Procedure: Lonell Face;  Surgeon: Florene Glen, MD;  Location: [facility] ORS;  Service: General;  Laterality: N/A;   WISDOM TOOTH EXTRACTION     Social History:  reports that she quit smoking about 5 years ago. Her smoking use included cigarettes. She has a 1.50 pack-year smoking history. She has never used smokeless tobacco. She reports that she does not drink alcohol and does not use drugs. Family History:  Family History  Problem Relation Age of Onset   Depression Mother    Hypertension Mother    Sleep apnea Mother    Asthma Mother    COPD Mother    Diabetes Mother    Anxiety disorder Mother    Arthritis Mother    Breast cancer Maternal Aunt        20's    Stroke Maternal Grandmother      HOME MEDICATIONS: Allergies as of 11/01/2021       Reactions   Betadine [povidone Iodine] Other (See Comments)   Reaction:  Unknown    Fish Allergy Hives   Iodine Rash   Shellfish-derived Products Other (See Comments)   Reaction:  Unknown         Medication List        Accurate as of November 01, 2021 10:19 AM. If you have any questions, ask your nurse or doctor.          albuterol 108 (90 Base) MCG/ACT inhaler Commonly known as: VENTOLIN HFA 2 puffs four times a day as needed   cabergoline 0.5 MG tablet Commonly known as: DOSTINEX Take 1 tablet (0.5 mg total) by mouth 2 (two) times a week.   Emgality 120 MG/ML Sosy Generic drug: Galcanezumab-gnlm Inject into the skin.   escitalopram 10 MG tablet Commonly known as: LEXAPRO Take 10 mg by mouth daily.   fluticasone-salmeterol 45-21 MCG/ACT inhaler Commonly known as: ADVAIR HFA Inhale 2 puffs into the lungs 2 (two) times daily.   hydrOXYzine 50 MG capsule Commonly  known as: VISTARIL Take 50 mg by mouth 2 (two) times daily as needed.   levothyroxine 75 MCG tablet Commonly known as: SYNTHROID Take 1 tablet (75 mcg total) by mouth daily.   lidocaine 5 % Commonly known as: [place] onto the skin.   lithium carbonate 300 MG capsule Take 1 capsule (300 mg total) by mouth 2 (two) times daily with a meal.   magnesium oxide 400 MG tablet Commonly known as: MAG-OX Take 400 mg by mouth daily.   meloxicam 7.5 MG tablet Commonly known as: MOBIC Take 7.5 mg by mouth daily.   omeprazole 20 MG capsule Commonly known as: PRILOSEC TAKE 1 CAPSULE (20 MG TOTAL) BY MOUTH DAILY.   paliperidone 6 MG 24 hr tablet Commonly known as: INVEGA Take 6 mg by mouth every morning.   prazosin 2 MG capsule Commonly known as: MINIPRESS Take 1 capsule (2 mg total) by mouth at bedtime.   pregabalin 50 MG capsule Commonly known as: LYRICA Take 50 mg by mouth 2 (two) times daily.           OBJECTIVE:   PHYSICAL EXAM: VS: BP (!) 120/7 (BP Location: Left Arm, Patient Position: Sitting, Cuff Size: Large)   Pulse 78   Ht '5\' 6"'$  (1.676 m)   Wt (!) 308 lb (139.7 kg)   SpO2 95%   BMI 49.71 kg/m   EXAM: General: Pt appears well and is in NAD  Neck: General: Supple without adenopathy. Thyroid: Thyroid size normal.    Lungs: Clear with good BS bilat with no rales, rhonchi, or wheezes  Heart: Auscultation: RRR.  Abdomen: Normoactive bowel sounds, soft, nontender, without masses or organomegaly palpable  Extremities:  BL LE: No pretibial edema normal ROM and strength.  Mental Status: Judgment, insight: Intact Mood and affect: No depression, anxiety, or agitation     DATA REVIEWED:     Latest Reference Range & Units 11/01/21 10:42  Sodium 135 - 145 mEq/L 139  Potassium 3.5 - 5.1 mEq/L 4.5  Chloride 96 - 112 mEq/L 107  CO2 19 - 32 mEq/L 27  Glucose 70 - 99 mg/dL 97  BUN 6 - 23 mg/dL 14  Creatinine 0.40 - 1.20 mg/dL 0.72  Calcium 8.4 - 10.5 mg/dL 9.8  GFR >60.00 mL/min 110.63  Prolactin ng/mL 17.2  TSH 0.35 - 5.50 uIU/mL 1.75     MRI brain 09/09/2019 Scattered foci of T2 hyperintensity are seen within the white matter of the cerebral hemispheres predominantly deep and juxtacortical white matter. No posterior fossa lesion identified. None of the lesions show enhancement after the intravenous administration of contrast.   Pituitary/Sella: The pituitary gland is normal in appearance and size for age without mass lesion. Mildly heterogeneous enhancement in the dynamic phase with homogeneous enhancement on the delayed is within normal limits. A normal posterior pituitary bright spot is seen. The infundibulum is midline. The hypothalamus and mamillary bodies are normal. There is no mass effect on the optic chiasm or optic nerves. The infundibular and chiasmatic recesses are clear. Normal cavernous sinus and cavernous internal carotid artery  flow voids.  ASSESSMENT / PLAN / RECOMMENDATIONS:    1.   Hypothyroidism   - Pt is clinically and biochemically euthyroid   - No local neck symptoms  -Preconception counseling done today, she was advised to increase LT-4 replacement by 20% and to schedule a follow-up with endocrinology within 6 weeks  -TSH normal Medication  Continue levothyroxine 75 mcg daily    2.Hyperprolactinemia:     -  Patient with chronic long-term history of intermittent hyperprolactinemia.This is attributed to multiple antipsychotic medications that have she has been on and off since her teenage years, that are known to cause hyperprolactinemia such as risperidone and paliperidone Melanie Bell ) -Repeat prolactin levels have started to increase in 2022, requiring initiation of small dose of cabergoline - MRI did NOT show any evidence of pituitary adenoma  -Patient advised to stop cabergoline with positive pregnancy test  -Prolactin normal  - There's a discussion with her psychiatrist about switching Invega to Abilify due to less risk of hyperprolactinemia which a letter has been given to her today indicating my agreement     Medication   Continue cabergoline 0.5 mg, 1 tablet twice weekly   F/U in 6 months     Signed electronically by: Mack Guise, MD  [facility]  [facility] [address] [place], [place] [address] Phone: [phone] FAX: [phone]      CC: Glean Hess, [address] [address] Phone: [phone]  Fax: [phone]   Return to Endocrinology clinic as below: Future Appointments  Date Time Provider Abie  11/01/2021 10:50 AM Daphney Hopke, Melanie Crazier, MD [facility] None  01/05/2022  2:40 PM [place] ADVISOR [facility]

## 2021-11-02 LAB — PROLACTIN: Prolactin: 17.2 ng/mL

## 2021-11-12 ENCOUNTER — Ambulatory Visit (INDEPENDENT_AMBULATORY_CARE_PROVIDER_SITE_OTHER): Payer: Medicaid Other | Admitting: Certified Nurse Midwife

## 2021-11-12 DIAGNOSIS — Z3200 Encounter for pregnancy test, result unknown: Secondary | ICD-10-CM | POA: Diagnosis not present

## 2021-11-12 DIAGNOSIS — Z3481 Encounter for supervision of other normal pregnancy, first trimester: Secondary | ICD-10-CM

## 2021-11-12 LAB — POCT URINE PREGNANCY: Preg Test, Ur: POSITIVE — AB

## 2021-11-12 NOTE — Progress Notes (Unsigned)
Pt presents for pregnancy confirmation done on 06/30/232, UPT POSITIVE. [id number]. Pt states she is currently taking PNV. EDD 06/17/22 based on LMP of 09/20/21. [redacted]w[redacted]d Pt to schedule f/u nurse visit, labs, etc. with front desk at checkout. All questions answered.

## 2021-11-12 NOTE — Progress Notes (Unsigned)
Pt presents for pregnancy confirmation done on 06/30/232, UPT POSITIVE. [id number]. Pt states she is currently taking PNV. EDD 06/17/22 based on LMP of 09/20/21. [redacted]w[redacted]d Pt to schedule f/u nurse visit, labs, etc. with front desk at checkout. Pt would like advising by provider on psychiatric medications as well as thyroid medications. Her psychiatrist has currently stopped her on all her psych meds due to pregnancy. All other questions answered.

## 2021-11-17 ENCOUNTER — Encounter: Payer: Self-pay | Admitting: Internal Medicine

## 2021-11-18 MED ORDER — LEVOTHYROXINE SODIUM 75 MCG PO TABS
75.0000 ug | ORAL_TABLET | ORAL | 2 refills | Status: DC
Start: 2021-11-18 — End: 2022-05-02

## 2021-11-23 ENCOUNTER — Telehealth: Payer: Self-pay | Admitting: Obstetrics

## 2021-11-23 ENCOUNTER — Other Ambulatory Visit: Payer: Self-pay | Admitting: Certified Nurse Midwife

## 2021-11-23 DIAGNOSIS — O039 Complete or unspecified spontaneous abortion without complication: Secondary | ICD-10-CM

## 2021-11-23 NOTE — Telephone Encounter (Signed)
Patient called and states that she had an appointment this morning for a Nurse OB Intake phone call. Patient states the appointment was showing cancelled in her mychart and was inquiring why appointment was cancelled when she has been calling all morning. Referred her call on provider in office.

## 2021-11-23 NOTE — Progress Notes (Signed)
Pt has concerns about canceling NOB nurse visit state she still has pregnancy symptoms ie nausea. She was seen in [place] and had a negative beta quant after episode of bleeding. Discussed repeat quant level , she is in agreement. She will have done on 17 th as she already had lab appointment on that day .   Philip Aspen, CNM

## 2021-11-23 NOTE — Telephone Encounter (Signed)
Pt called at 8:11 am to confirm her appt for NEW OB nurse intake. I confirmed her appt and informed her the Nurse would call her. Pt called back at 8:46 am to ask why she had not been contacted . I informed her I would reach out to the [place] and someone would reach out to her. I reached out to CMA Velva Harman to inform her Miss Brian was anticipating a appointment call. CMA Velva Harman informed me she had been sent a staff message to cancel the appt per _ provider.

## 2021-11-29 ENCOUNTER — Other Ambulatory Visit: Payer: Medicaid Other

## 2021-11-29 DIAGNOSIS — O039 Complete or unspecified spontaneous abortion without complication: Secondary | ICD-10-CM

## 2021-11-30 ENCOUNTER — Other Ambulatory Visit: Payer: Self-pay

## 2021-11-30 ENCOUNTER — Emergency Department: Payer: Medicaid Other

## 2021-11-30 ENCOUNTER — Emergency Department
Admission: EM | Admit: 2021-11-30 | Discharge: 2021-12-01 | Disposition: A | Discharge status: Home / Self Care | Payer: Medicaid Other | Attending: Emergency Medicine | Admitting: Emergency Medicine

## 2021-11-30 ENCOUNTER — Encounter: Payer: Self-pay | Admitting: Certified Nurse Midwife

## 2021-11-30 DIAGNOSIS — R110 Nausea: Secondary | ICD-10-CM | POA: Insufficient documentation

## 2021-11-30 DIAGNOSIS — K5790 Diverticulosis of intestine, part unspecified, without perforation or abscess without bleeding: Secondary | ICD-10-CM | POA: Insufficient documentation

## 2021-11-30 DIAGNOSIS — K8020 Calculus of gallbladder without cholecystitis without obstruction: Secondary | ICD-10-CM | POA: Diagnosis not present

## 2021-11-30 DIAGNOSIS — R4182 Altered mental status, unspecified: Secondary | ICD-10-CM | POA: Diagnosis not present

## 2021-11-30 DIAGNOSIS — J45909 Unspecified asthma, uncomplicated: Secondary | ICD-10-CM | POA: Insufficient documentation

## 2021-11-30 DIAGNOSIS — I10 Essential (primary) hypertension: Secondary | ICD-10-CM | POA: Diagnosis not present

## 2021-11-30 DIAGNOSIS — R55 Syncope and collapse: Secondary | ICD-10-CM

## 2021-11-30 LAB — CBC WITH DIFFERENTIAL/PLATELET
Abs Immature Granulocytes: 0.04 10*3/uL (ref 0.00–0.07)
Basophils Absolute: 0 10*3/uL (ref 0.0–0.1)
Basophils Relative: 0 %
Eosinophils Absolute: 0.1 10*3/uL (ref 0.0–0.5)
Eosinophils Relative: 1 %
HCT: 41.8 % (ref 36.0–46.0)
Hemoglobin: 13.7 g/dL (ref 12.0–15.0)
Immature Granulocytes: 0 %
Lymphocytes Relative: 21 %
Lymphs Abs: 2.1 10*3/uL (ref 0.7–4.0)
MCH: 29.4 pg (ref 26.0–34.0)
MCHC: 32.8 g/dL (ref 30.0–36.0)
MCV: 89.7 fL (ref 80.0–100.0)
Monocytes Absolute: 0.6 10*3/uL (ref 0.1–1.0)
Monocytes Relative: 6 %
Neutro Abs: 7.2 10*3/uL (ref 1.7–7.7)
Neutrophils Relative %: 72 %
Platelets: 253 10*3/uL (ref 150–400)
RBC: 4.66 MIL/uL (ref 3.87–5.11)
RDW: 13.2 % (ref 11.5–15.5)
WBC: 10 10*3/uL (ref 4.0–10.5)
nRBC: 0 % (ref 0.0–0.2)

## 2021-11-30 LAB — URINALYSIS, ROUTINE W REFLEX MICROSCOPIC
Bilirubin Urine: NEGATIVE
Glucose, UA: NEGATIVE mg/dL
Hgb urine dipstick: NEGATIVE
Ketones, ur: NEGATIVE mg/dL
Leukocytes,Ua: NEGATIVE
Nitrite: NEGATIVE
Protein, ur: NEGATIVE mg/dL
Specific Gravity, Urine: 1.004 — ABNORMAL LOW (ref 1.005–1.030)
pH: 8 (ref 5.0–8.0)

## 2021-11-30 LAB — BETA HCG QUANT (REF LAB): hCG Quant: <1 m[IU]/mL

## 2021-11-30 LAB — POC URINE PREG, ED: Preg Test, Ur: NEGATIVE

## 2021-11-30 MED ORDER — SODIUM CHLORIDE 0.9 % IV BOLUS
1000.0000 mL | Freq: Once | INTRAVENOUS | Status: AC
  Administered 2021-11-30: 1000 mL via INTRAVENOUS

## 2021-11-30 NOTE — ED Notes (Signed)
Patient and significant other provided with warm blankets per request

## 2021-11-30 NOTE — ED Triage Notes (Signed)
Pt miscarried on the 30th of June and was instructed yesterday at her OBGYN appt to restart her SSRI medication that she hadnt taken in over a month. Pt went and laid down in the bed and family was unable to wake her. Pt states she hasnt felt good since restarting meds yesterday.
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
## 2021-12-01 ENCOUNTER — Emergency Department
Admission: EM | Admit: 2021-12-01 | Discharge: 2021-12-01 | Disposition: A | Discharge status: Home / Self Care | Payer: Medicaid Other | Source: Home / Self Care | Attending: Emergency Medicine | Admitting: Emergency Medicine

## 2021-12-01 ENCOUNTER — Other Ambulatory Visit: Payer: Self-pay

## 2021-12-01 ENCOUNTER — Emergency Department: Payer: Medicaid Other

## 2021-12-01 DIAGNOSIS — K5790 Diverticulosis of intestine, part unspecified, without perforation or abscess without bleeding: Secondary | ICD-10-CM

## 2021-12-01 DIAGNOSIS — I10 Essential (primary) hypertension: Secondary | ICD-10-CM | POA: Insufficient documentation

## 2021-12-01 DIAGNOSIS — R112 Nausea with vomiting, unspecified: Secondary | ICD-10-CM

## 2021-12-01 DIAGNOSIS — K8020 Calculus of gallbladder without cholecystitis without obstruction: Secondary | ICD-10-CM

## 2021-12-01 LAB — HEPATIC FUNCTION PANEL
ALT: 20 U/L (ref 0–44)
AST: 25 U/L (ref 15–41)
Albumin: 4.1 g/dL (ref 3.5–5.0)
Alkaline Phosphatase: 76 U/L (ref 38–126)
Bilirubin, Direct: <0.1 mg/dL (ref 0.0–0.2)
Total Bilirubin: 0.6 mg/dL (ref 0.3–1.2)
Total Protein: 7.7 g/dL (ref 6.5–8.1)

## 2021-12-01 LAB — CBC WITH DIFFERENTIAL/PLATELET
Abs Immature Granulocytes: 0.03 10*3/uL (ref 0.00–0.07)
Basophils Absolute: 0 10*3/uL (ref 0.0–0.1)
Basophils Relative: 0 %
Eosinophils Absolute: 0 10*3/uL (ref 0.0–0.5)
Eosinophils Relative: 0 %
HCT: 44.2 % (ref 36.0–46.0)
Hemoglobin: 14.4 g/dL (ref 12.0–15.0)
Immature Granulocytes: 0 %
Lymphocytes Relative: 17 %
Lymphs Abs: 1.9 10*3/uL (ref 0.7–4.0)
MCH: 29.6 pg (ref 26.0–34.0)
MCHC: 32.6 g/dL (ref 30.0–36.0)
MCV: 90.9 fL (ref 80.0–100.0)
Monocytes Absolute: 0.6 10*3/uL (ref 0.1–1.0)
Monocytes Relative: 5 %
Neutro Abs: 8.9 10*3/uL — ABNORMAL HIGH (ref 1.7–7.7)
Neutrophils Relative %: 78 %
Platelets: 266 10*3/uL (ref 150–400)
RBC: 4.86 MIL/uL (ref 3.87–5.11)
RDW: 13.1 % (ref 11.5–15.5)
WBC: 11.4 10*3/uL — ABNORMAL HIGH (ref 4.0–10.5)
nRBC: 0 % (ref 0.0–0.2)

## 2021-12-01 LAB — COMPREHENSIVE METABOLIC PANEL
ALT: 20 U/L (ref 0–44)
AST: 24 U/L (ref 15–41)
Albumin: 3.9 g/dL (ref 3.5–5.0)
Alkaline Phosphatase: 68 U/L (ref 38–126)
Anion gap: 6 (ref 5–15)
BUN: 7 mg/dL (ref 6–20)
CO2: 23 mmol/L (ref 22–32)
Calcium: 9.5 mg/dL (ref 8.9–10.3)
Chloride: 111 mmol/L (ref 98–111)
Creatinine, Ser: 0.73 mg/dL (ref 0.44–1.00)
GFR, Estimated: >60 mL/min (ref >60)
Glucose, Bld: 102 mg/dL — ABNORMAL HIGH (ref 70–99)
Potassium: 3.7 mmol/L (ref 3.5–5.1)
Sodium: 140 mmol/L (ref 135–145)
Total Bilirubin: 0.9 mg/dL (ref 0.3–1.2)
Total Protein: 7.3 g/dL (ref 6.5–8.1)

## 2021-12-01 LAB — BASIC METABOLIC PANEL
Anion gap: 5 (ref 5–15)
BUN: 11 mg/dL (ref 6–20)
CO2: 22 mmol/L (ref 22–32)
Calcium: 9.7 mg/dL (ref 8.9–10.3)
Chloride: 114 mmol/L — ABNORMAL HIGH (ref 98–111)
Creatinine, Ser: 0.66 mg/dL (ref 0.44–1.00)
GFR, Estimated: >60 mL/min (ref >60)
Glucose, Bld: 92 mg/dL (ref 70–99)
Potassium: 3.9 mmol/L (ref 3.5–5.1)
Sodium: 141 mmol/L (ref 135–145)

## 2021-12-01 LAB — LITHIUM LEVEL: Lithium Lvl: 0.11 mmol/L — ABNORMAL LOW (ref 0.60–1.20)

## 2021-12-01 LAB — URINE DRUG SCREEN, QUALITATIVE (ARMC ONLY)
Amphetamines, Ur Screen: NOT DETECTED
Barbiturates, Ur Screen: NOT DETECTED
Benzodiazepine, Ur Scrn: NOT DETECTED
Cannabinoid 50 Ng, Ur ~~LOC~~: NOT DETECTED
Cocaine Metabolite,Ur ~~LOC~~: NOT DETECTED
MDMA (Ecstasy)Ur Screen: NOT DETECTED
Methadone Scn, Ur: NOT DETECTED
Opiate, Ur Screen: NOT DETECTED
Phencyclidine (PCP) Ur S: NOT DETECTED
Tricyclic, Ur Screen: NOT DETECTED

## 2021-12-01 LAB — SALICYLATE LEVEL: Salicylate Lvl: <7 mg/dL — ABNORMAL LOW (ref 7.0–30.0)

## 2021-12-01 LAB — ACETAMINOPHEN LEVEL: Acetaminophen (Tylenol), Serum: <10 ug/mL — ABNORMAL LOW (ref 10–30)

## 2021-12-01 LAB — LIPASE, BLOOD: Lipase: 30 U/L (ref 11–51)

## 2021-12-01 MED ORDER — ONDANSETRON 4 MG PO TBDP: 4.0000 mg | ORAL_TABLET | Freq: Three times a day (TID) | ORAL | 0 refills | Status: AC | PRN

## 2021-12-01 MED ORDER — ONDANSETRON 4 MG PO TBDP
4.0000 mg | ORAL_TABLET | Freq: Once | ORAL | Status: AC
  Administered 2021-12-01: 4 mg via ORAL
  Filled 2021-12-01: qty 1

## 2021-12-01 MED ORDER — SUCRALFATE 1 GM/10ML PO SUSP
1.0000 g | ORAL | Status: AC
  Administered 2021-12-01: 1 g via ORAL
  Filled 2021-12-01: qty 10

## 2021-12-01 MED ORDER — PROCHLORPERAZINE EDISYLATE 10 MG/2ML IJ SOLN
10.0000 mg | Freq: Once | INTRAMUSCULAR | Status: AC
Start: 2021-12-01 — End: 2021-12-01
  Administered 2021-12-01: 10 mg via INTRAVENOUS
  Filled 2021-12-01: qty 2

## 2021-12-01 MED ORDER — ONDANSETRON 4 MG PO TBDP
4.0000 mg | ORAL_TABLET | Freq: Once | ORAL | Status: AC
Start: 2021-12-01 — End: 2021-12-01
  Administered 2021-12-01: 4 mg via ORAL
  Filled 2021-12-01: qty 1

## 2021-12-01 MED ORDER — SUCRALFATE 1 G PO TABS: 1.0000 g | ORAL_TABLET | Freq: Four times a day (QID) | ORAL | 0 refills | Status: DC

## 2021-12-01 MED ORDER — LACTATED RINGERS IV BOLUS
1000.0000 mL | Freq: Once | INTRAVENOUS | Status: AC
  Administered 2021-12-01: 1000 mL via INTRAVENOUS

## 2021-12-01 MED ORDER — ALUM & MAG HYDROXIDE-SIMETH 200-200-20 MG/5ML PO SUSP
30.0000 mL | Freq: Once | ORAL | Status: AC
  Administered 2021-12-01: 30 mL via ORAL
  Filled 2021-12-01: qty 30

## 2021-12-01 NOTE — ED Provider Notes (Signed)
-----------------------------------------   1:04 AM on 12/01/2021 -----------------------------------------   Updated patient and spouse on all laboratory and imaging results.  We discussed that she should titrate her medicines.  She can safely restart Synthroid, omeprazole and meloxicam.  I suggested she restart her prazosin first as she states this really helps with her nightmares.  If she is doing fine in a week, then she may restart her Lexapro.  If she is still doing well after a week then she may restart her Invega.  If she is still doing well in a week then she may restart her lithium.  She will follow-up closely with her doctor.  Strict return precautions given.  Both verbalized understanding agree with plan of care.   Paulette Blanch, MD 12/01/21 [phone]

## 2021-12-01 NOTE — ED Notes (Signed)
Pt taken to CT.

## 2021-12-01 NOTE — Discharge Instructions (Signed)
Restart your medications in the order that we discussed.  Wait 1 week in between starting each medication.  Return to the ER for recurrent or worsening symptoms, persistent vomiting, difficulty breathing, lethargy or other concerns.

## 2021-12-01 NOTE — ED Triage Notes (Signed)
Pt to ED for vomiting since 2 days ago, "green bile" and today cannot hold anything down. Has been nauseous for several days.  Yesterday came to ED seen for loss of consciousness. Was told may have been from abruptly stopping and starting SSRI (by psychiatrist). Pt was also stopped then started again on lithium. This was all because pt was pregnant, but ended up having SAB.  Pt was also having diarrhea on Monday.

## 2021-12-01 NOTE — ED Provider Triage Note (Signed)
Emergency Medicine Provider Triage Evaluation Note  Melanie Bell, a 31 y.o. female  was evaluated in triage.  Pt complains of nausea and vomiting.  She was evaluated in the ED yesterday and discharged after reassuring work-up for syncope.  She was advised to restart her previously prescribed medications with a weekly titration.  She presents today after onset of sudden nausea and vomiting, inability to keep down solids or liquids.  Patient presents in no acute distress at this time.  Review of Systems  Positive: NV Negative: FCS  Physical Exam  BP 135/85 (BP Location: Left Arm)   Pulse 75   Temp 98.4 F (36.9 C) (Oral)   Resp 16   Ht '5\' 7"'$  (1.702 m)   Wt 136.1 kg   LMP 09/20/2021 Comment: miscarriag e  SpO2 94%   BMI 46.99 kg/m  Gen:   Awake, no distress  NAD Resp:  Normal effort  MSK:   Moves extremities without difficulty  Other:  Soft, nontender  Medical Decision Making  Medically screening exam initiated at 5:49 PM.  Appropriate orders placed.  Melanie Bell was informed that the remainder of the evaluation will be completed by another provider, this initial triage assessment does not replace that evaluation, and the importance of remaining in the ED until their evaluation is complete.  Presents to the ED with reports of nausea and vomiting with onset today.  She was evaluated in the ED yesterday with a reassuring work-up after a syncopal episode.   Melanie Needles, PA-C 12/01/21 1750

## 2021-12-01 NOTE — ED Provider Notes (Signed)
[facility] Provider Note    Event Date/Time   First MD Initiated Contact with Patient 12/01/21 1828     (approximate)   History   Emesis and Nausea   HPI  Makila Colombe is a 31 y.o. female  past medical history of schizoaffective disorder, GERD, HTN, HDL, depression, anxiety and recent ED evaluation yesterday for apparent unresponsive episode in the setting of recent miscarriage and initiation of anxiety depression medications who presents for evaluation of nausea vomiting some mild epigastric discomfort.  Patient states that she talk to her psychiatrist today and instead of going back to her normal doses of her psychiatric medications she will stop taking them until she can gradually be uptitrated.  She states she is actually in a couple days of nausea and vomiting.  She denies any back pain, burning with urination, blood in her urine so she had a little diarrhea yesterday and feels she has been urinating less than usual.  No chest pain, cough, shortness of breath, headache, earache, sore throat or passing out today.  No recent EtOH use or illicit drug use.  No antiemetics prior today.  No other acute concerns at this time.      Physical Exam  Triage Vital Signs: ED Triage Vitals  Enc Vitals Group     BP 12/01/21 1742 135/85     Pulse Rate 12/01/21 1742 75     Resp 12/01/21 1742 16     Temp 12/01/21 1742 98.4 F (36.9 C)     Temp Source 12/01/21 1742 Oral     SpO2 12/01/21 1742 94 %     Weight 12/01/21 1746 300 lb (136.1 kg)     Height 12/01/21 1746 '5\' 7"'$  (1.702 m)     Head Circumference --      Peak Flow --      Pain Score 12/01/21 1801 5     Pain Loc --      Pain Edu? --      Excl. in [place]? --     Most recent vital signs: Vitals:   12/01/21 2032 12/01/21 2154  BP: 124/62 (!) 130/56  Pulse: 80 80  Resp: 15 17  Temp:  98.5 F (36.9 C)  SpO2: 99% 96%    General: Awake, no distress.  CV:  Good peripheral perfusion.  2+ radial  pulse. Resp:  Normal effort.  Clear bilaterally Abd:  No distention.  Soft throughout.  No CVA tenderness. Other:     ED Results / Procedures / Treatments  Labs (all labs ordered are listed, but only abnormal results are displayed) Labs Reviewed  BASIC METABOLIC PANEL - Abnormal; Notable for the following components:      Result Value   Chloride 114 (*)    All other components within normal limits  CBC WITH DIFFERENTIAL/PLATELET - Abnormal; Notable for the following components:   WBC 11.4 (*)    Neutro Abs 8.9 (*)    All other components within normal limits  GASTROINTESTINAL PANEL BY PCR, STOOL (REPLACES STOOL CULTURE)  C DIFFICILE QUICK SCREEN W PCR REFLEX    HEPATIC FUNCTION PANEL  LIPASE, BLOOD     EKG  ECG is remarkable for sinus rhythm with ventricular rate of 73, normal axis, unremarkable intervals without clear evidence of acute ischemia or significant arrhythmia.   RADIOLOGY  CT abdomen pelvis my interpretation with evidence of SBO, ileus, diverticulitis, pancreatitis, kidney stone, abnormal adnexa or other clear acute process.  I reviewed radiology interpretation  and agree their findings of hepatomegaly and diverticulosis without diverticulitis as well as cholelithiasis without evidence of cholecystitis.  PROCEDURES:  Critical Care performed: No  Procedures   MEDICATIONS ORDERED IN ED: Medications  ondansetron (ZOFRAN-ODT) disintegrating tablet 4 mg (4 mg Oral Given 12/01/21 1956)  alum & mag hydroxide-simeth (MAALOX/MYLANTA) 200-200-20 MG/5ML suspension 30 mL (30 mLs Oral Given 12/01/21 1956)  sucralfate (CARAFATE) 1 GM/10ML suspension 1 g (1 g Oral Given 12/01/21 1956)  ondansetron (ZOFRAN-ODT) disintegrating tablet 4 mg (4 mg Oral Given 12/01/21 2025)  lactated ringers bolus 1,000 mL (1,000 mLs Intravenous New Bag/Given 12/01/21 2149)  prochlorperazine (COMPAZINE) injection 10 mg (10 mg Intravenous Given 12/01/21 2144)     IMPRESSION / MDM / ASSESSMENT AND  PLAN / ED COURSE  I reviewed the triage vital signs and the nursing notes. Patient's presentation is most consistent with acute presentation with potential threat to life or bodily function.                               Differential diagnosis includes, but is not limited to effects of taking antidepressants yesterday and then stopping them, infectious gastroenteritis, peptic ulcer disease, GERD, pancreatitis, cystitis, antibiotic arrangements.  I reviewed patient's work-up yesterday and late last night included labs and urine studies.  UA obtained yesterday is unremarkable without evidence of infection.  Acetaminophen salicylate level negative.  Pregnancy test negative.  UDS negative.  Same level 0.11.  Given her recent's urine studies and living will retracted up believe these require recheck today.  She had repeat BMP and CBC drawn in triage and abnormal hepatic function panel lipase.  BMP today shows no significant electrolyte or metabolic derangements.  CBC shows a somewhat nonspecific slightly elevated WC count 11.4 with normal hemoglobin and platelets.  [place] function panel and lipase are unremarkable.   CT abdomen pelvis my interpretation with evidence of SBO, ileus, diverticulitis, pancreatitis, kidney stone, abnormal adnexa or other clear acute process.  I reviewed radiology interpretation and agree their findings of hepatomegaly and diverticulosis without diverticulitis as well as cholelithiasis without evidence of cholecystitis.  Patient given 8 mg Zofran and Compazine IV fluids.  On reassessment she is feeling little better and able tolerate p.o.  She feels comfortable going home.  Will write Rx for Zofran and Carafate.  Discharged in stable condition.  Strict return precautions advised and discussed.  Advised of incidental findings of gallstones and fatty liver.      FINAL CLINICAL IMPRESSION(S) / ED DIAGNOSES   Final diagnoses:  Nausea vomiting and diarrhea  Calculus of  gallbladder without cholecystitis without obstruction  Diverticulosis     Rx / DC Orders   ED Discharge Orders          Ordered    ondansetron (ZOFRAN-ODT) 4 MG disintegrating tablet  Every 8 hours PRN        12/01/21 2225    sucralfate (CARAFATE) 1 g tablet  4 times daily        12/01/21 2225             Note:  This document was prepared using Dragon voice recognition software and may include unintentional dictation errors.   Lucrezia Starch, MD 12/01/21 2226

## 2021-12-06 ENCOUNTER — Encounter: Payer: Self-pay | Admitting: Internal Medicine

## 2021-12-09 ENCOUNTER — Encounter: Payer: Medicaid Other | Admitting: Certified Nurse Midwife

## 2022-01-05 ENCOUNTER — Ambulatory Visit (INDEPENDENT_AMBULATORY_CARE_PROVIDER_SITE_OTHER): Payer: Medicaid Other

## 2022-01-05 ENCOUNTER — Telehealth: Payer: Self-pay

## 2022-01-05 VITALS — Ht 67.0 in | Wt 300.0 lb

## 2022-01-05 DIAGNOSIS — Z0000 Encounter for general adult medical examination without abnormal findings: Secondary | ICD-10-CM | POA: Diagnosis not present

## 2022-01-05 NOTE — Patient Instructions (Signed)
  Melanie Bell , Thank you for taking time to come for your Medicare Wellness Visit. I appreciate your ongoing commitment to your health goals. Please review the following plan we discussed and let me know if I can assist you in the future.   These are the goals we discussed:  Goals      DIET - INCREASE WATER INTAKE     Eat Healthy        This is a list of the screening recommended for you and due dates:  Health Maintenance  Topic Date Due   COVID-19 Vaccine (4 - Moderna series) 11/29/2020   Flu Shot  12/14/2021   Pap Smear  08/21/2022   Tetanus Vaccine  08/20/2029   Hepatitis C Screening: USPSTF Recommendation to screen - Ages 18-79 yo.  Completed   HIV Screening  Completed   Pneumococcal Vaccination  Aged Out   HPV Vaccine  Aged Out

## 2022-01-05 NOTE — Progress Notes (Deleted)
Subjective:   Melanie Bell is a 31 y.o. female who presents for Medicare Annual (Subsequent) preventive examination.  I connected with  Howard Patton on 01/05/22 by a audio enabled telemedicine application and verified that I am speaking with the correct person using two identifiers.  Patient Location: Home  Provider Location: Office/Clinic  I discussed the limitations of evaluation and management by telemedicine. The patient expressed understanding and agreed to proceed.   Review of Systems    Defer to PCP       Objective:    There were no vitals filed for this visit. There is no height or weight on file to calculate BMI.     12/01/2021    6:03 PM 11/30/2021   10:36 PM 07/01/2021   11:47 AM 03/18/2021    9:04 AM 01/29/2021    9:01 AM 01/04/2021    3:45 PM 02/12/2020   10:13 AM  Advanced Directives  Does Patient Have a Medical Advance Directive? No No No No No No No  Would patient like information on creating a medical advance directive?   No - Patient declined   No - Patient declined     Current Medications (verified) Outpatient Encounter Medications as of 01/05/2022  Medication Sig   albuterol (VENTOLIN HFA) 108 (90 Base) MCG/ACT inhaler 2 puffs four times a day as needed   cabergoline (DOSTINEX) 0.5 MG tablet Take 1 tablet (0.5 mg total) by mouth 2 (two) times a week.   EMGALITY 120 MG/ML SOSY Inject into the skin. (Patient not taking: Reported on 11/12/2021)   escitalopram (LEXAPRO) 10 MG tablet Take 10 mg by mouth daily.   fluticasone-salmeterol (ADVAIR HFA) 45-21 MCG/ACT inhaler Inhale 2 puffs into the lungs 2 (two) times daily.   hydrOXYzine (VISTARIL) 50 MG capsule Take 50 mg by mouth 2 (two) times daily as needed. (Patient not taking: Reported on 11/12/2021)   levothyroxine (SYNTHROID) 75 MCG tablet Take 1 tablet (75 mcg total) by mouth as directed. Two tablets on Sundays and Wednesday and 1 tablet rest of the week   lithium carbonate 300 MG capsule Take 1  capsule (300 mg total) by mouth 2 (two) times daily with a meal.   magnesium oxide (MAG-OX) 400 MG tablet Take 400 mg by mouth daily.   meloxicam (MOBIC) 7.5 MG tablet Take 7.5 mg by mouth daily.   omeprazole (PRILOSEC) 20 MG capsule TAKE 1 CAPSULE (20 MG TOTAL) BY MOUTH DAILY.   paliperidone (INVEGA) 6 MG 24 hr tablet Take 6 mg by mouth every morning.   prazosin (MINIPRESS) 2 MG capsule Take 1 capsule (2 mg total) by mouth at bedtime.   pregabalin (LYRICA) 50 MG capsule Take 50 mg by mouth 2 (two) times daily.   Prenatal Vit-Fe Fumarate-FA (PRENATAL MULTIVITAMIN) TABS tablet Take 1 tablet by mouth daily at 12 noon. (Patient not taking: Reported on 12/01/2021)   sucralfate (CARAFATE) 1 g tablet Take 1 tablet (1 g total) by mouth 4 (four) times daily for 5 days.   No facility-administered encounter medications on file as of 01/05/2022.    Allergies (verified) Betadine [povidone iodine], Fish allergy, Iodine, and Shellfish-derived products   History: Past Medical History:  Diagnosis Date   Allergy    Anxiety    Arthritis    Asthma    Depression    Foreign body aspiration    GERD (gastroesophageal reflux disease)    Hallucinations 09/30/2014   Sizoaffective   Hyperlipidemia    Hypertension  Intentional ingestion of batteries 04/21/2016    2 AAA batteries and 1 thumb tack; intent to hurt herself/notes 35/07/6142   Self-inflicted injury 31/54/0086   Suicidal ideation 01/02/2018   Tardive dyskinesia 10/2014   recent onset   Thyroid disease    Past Surgical History:  Procedure Laterality Date   ABDOMINAL SURGERY     "years ago" to remove foreign objects   APPENDECTOMY     BREAST EXCISIONAL BIOPSY Right 09/03/2007   surgical bx removed fibroadeoma   BREAST LUMPECTOMY Right    COLONOSCOPY WITH PROPOFOL N/A 09/10/2015   Procedure: COLONOSCOPY WITH PROPOFOL;  Surgeon: [name], MD;  Location: [facility] ENDOSCOPY;  Service: Endoscopy;  Laterality: N/A;    ESOPHAGOGASTRODUODENOSCOPY N/A 11/28/2014   Procedure: ESOPHAGOGASTRODUODENOSCOPY (EGD);  Surgeon: [name], MD;  Location: [facility] ENDOSCOPY;  Service: Endoscopy;  Laterality: N/A;   ESOPHAGOGASTRODUODENOSCOPY N/A 02/21/2016   Procedure: ESOPHAGOGASTRODUODENOSCOPY (EGD);  Surgeon: Mauri Pole, MD;  Location: [facility] ENDOSCOPY;  Service: Endoscopy;  Laterality: N/A;   ESOPHAGOGASTRODUODENOSCOPY N/A 04/21/2016   Procedure: ESOPHAGOGASTRODUODENOSCOPY (EGD);  Surgeon: Gatha Mayer, MD;  Location: [facility] ENDOSCOPY;  Service: Endoscopy;  Laterality: N/A;   ESOPHAGOGASTRODUODENOSCOPY N/A 05/06/2016   Procedure: ESOPHAGOGASTRODUODENOSCOPY (EGD);  Surgeon: Jonathon Bellows, MD;  Location: [facility] ENDOSCOPY;  Service: Gastroenterology;  Laterality: N/A;   ESOPHAGOGASTRODUODENOSCOPY N/A 06/13/2016   Procedure: ESOPHAGOGASTRODUODENOSCOPY (EGD);  Surgeon: [name], MD;  Location: [facility] ENDOSCOPY;  Service: Endoscopy;  Laterality: N/A;   ESOPHAGOGASTRODUODENOSCOPY (EGD) WITH PROPOFOL N/A 02/29/2016   Procedure: ESOPHAGOGASTRODUODENOSCOPY (EGD) WITH PROPOFOL;  Surgeon: [name], MD;  Location: [facility] ENDOSCOPY;  Service: Endoscopy;  Laterality: N/A;   ESOPHAGOGASTRODUODENOSCOPY (EGD) WITH PROPOFOL N/A 01/15/2018   Procedure: ESOPHAGOGASTRODUODENOSCOPY (EGD) WITH PROPOFOL;  Surgeon: Daneil Dolin, MD;  Location: [facility] ENDO SUITE;  Service: Endoscopy;  Laterality: N/A;  retrieval forein body   LAPAROTOMY N/A 09/12/2015   Procedure: EXPLORATORY LAPAROTOMY;  Surgeon: Florene Glen, MD;  Location: [facility] ORS;  Service: General;  Laterality: N/A;   SIGMOIDOSCOPY N/A 09/12/2015   Procedure: Lonell Face;  Surgeon: Florene Glen, MD;  Location: [facility] ORS;  Service: General;  Laterality: N/A;   WISDOM TOOTH EXTRACTION     Family History  Problem Relation Age of Onset   Depression Mother    Hypertension Mother    Sleep apnea Mother    Asthma Mother    COPD Mother    Diabetes Mother    Anxiety disorder Mother     Arthritis Mother    Breast cancer Maternal Aunt        20's   Stroke Maternal Grandmother    Social History   Socioeconomic History   Marital status: Married    Spouse name: Not on file   Number of children: 0   Years of education: 12   Highest education level: Not on file  Occupational History   Occupation: unemployed  Tobacco Use   Smoking status: Former    Packs/day: 0.50    Years: 3.00    Total pack years: 1.50    Types: Cigarettes    Quit date: 08/14/2016    Years since quitting: 5.3   Smokeless tobacco: Never  Vaping Use   Vaping Use: Never used  Substance and Sexual Activity   Alcohol use: No   Drug use: No   Sexual activity: Yes    Partners: Male    Birth control/protection: None  Other Topics Concern   Not on file  Social History Narrative   Right handed  One story home   Drinks occasional caffeine   Social Determinants of Health   Financial Resource Strain: Low Risk  (01/04/2021)   Overall Financial Resource Strain (CARDIA)    Difficulty of Paying Living Expenses: Not very hard  Food Insecurity: No Food Insecurity (01/04/2021)   Hunger Vital Sign    Worried About Running Out of Food in the Last Year: Never true    Ran Out of Food in the Last Year: Never true  Transportation Needs: No Transportation Needs (01/04/2021)   PRAPARE - Hydrologist (Medical): No    Lack of Transportation (Non-Medical): No  Physical Activity: Inactive (01/04/2021)   Exercise Vital Sign    Days of Exercise per Week: 0 days    Minutes of Exercise per Session: 0 min  Stress: Not on file  Social Connections: Moderately Integrated (01/04/2021)   Social Connection and Isolation Panel [NHANES]    Frequency of Communication with Friends and Family: More than three times a week    Frequency of Social Gatherings with Friends and Family: More than three times a week    Attends Religious Services: More than 4 times per year    Active Member of [employer] or  Organizations: No    Attends Archivist Meetings: Never    Marital Status: Living with partner    Tobacco Counseling Counseling given: Not Answered   Clinical Intake:                 Diabetic? No.         Activities of Daily Living    09/03/2021    9:15 AM  In your present state of health, do you have any difficulty performing the following activities:  Hearing? 0  Vision? 0  Difficulty concentrating or making decisions? 0  Walking or climbing stairs? 0  Dressing or bathing? 0  Doing errands, shopping? 0    Patient Care Team: Glean Hess, MD as PCP - General (Internal Medicine) Charlcie Cradle, MD (Psychiatry) Quintin Alto, MD as Consulting Physician (Rheumatology) Shamleffer, Melanie Crazier, MD as Consulting Physician (Endocrinology) Philip Aspen, CNM as Midwife (Certified Nurse Midwife) Vladimir Crofts, MD as Consulting Physician (Neurology)  Indicate any recent Medical Services you may have received from other than [facility] providers in the past year (date may be approximate).     Assessment:   This is a routine wellness examination for [place].  Hearing/Vision screen No concerns at this time.  Dietary issues and exercise activities discussed:     Goals Addressed   None   Depression Screen    09/03/2021    9:15 AM 03/05/2021    9:04 AM 01/04/2021    3:43 PM 12/01/2020    2:52 PM  PHQ 2/9 Scores  PHQ - 2 Score 0 0 0 0  PHQ- 9 Score 0 0 0 0    Fall Risk    09/03/2021    9:15 AM 01/04/2021    3:48 PM 12/01/2020    2:53 PM 02/12/2020   10:13 AM 09/19/2019   12:52 PM  Fall Risk   Falls in the past year? 0 1 0 0 0  Number falls in past yr: 0 1 1 0 0  Injury with Fall? 0 0 1 0 0  Risk for fall due to : No Fall Risks History of fall(s);Orthopedic patient History of fall(s);Impaired balance/gait    Follow up Falls evaluation completed Falls prevention discussed Falls evaluation completed  FALL RISK PREVENTION PERTAINING TO  THE HOME:  Any stairs in or around the home? {YES/NO:21197} If so, are there any without handrails? {YES/NO:21197} Home free of loose throw rugs in walkways, pet beds, electrical cords, etc? {YES/NO:21197} Adequate lighting in your home to reduce risk of falls? {YES/NO:21197}  ASSISTIVE DEVICES UTILIZED TO PREVENT FALLS:  Life alert? {YES/NO:21197} Use of a cane, walker or w/c? {YES/NO:21197} Grab bars in the bathroom? {YES/NO:21197} Shower chair or bench in shower? {YES/NO:21197} Elevated toilet seat or a handicapped toilet? {YES/NO:21197}  TIMED UP AND GO:  Was the test performed? No .   Cognitive Function:        Immunizations Immunization History  Administered Date(s) Administered   Influenza,inj,Quad PF,6+ Mos 03/05/2021   Influenza-Unspecified 05/31/2016   Moderna Sars-Covid-2 Vaccination 12/29/2019, 01/26/2020, 10/04/2020   PPD Test 12/05/2017   Tdap 08/21/2019    TDAP status: Up to date  Flu Vaccine status: Due, Education has been provided regarding the importance of this vaccine. Advised may receive this vaccine at local pharmacy or Health Dept. Aware to provide a copy of the vaccination record if obtained from local pharmacy or Health Dept. Verbalized acceptance and understanding.  Pneumococcal vaccine status: Up to date  Covid-19 vaccine status: Completed vaccines  Qualifies for Shingles Vaccine? No   Zostavax completed No   Shingrix Completed?: No.    Education has been provided regarding the importance of this vaccine. Patient has been advised to call insurance company to determine out of pocket expense if they have not yet received this vaccine. Advised may also receive vaccine at local pharmacy or Health Dept. Verbalized acceptance and understanding.  Screening Tests Health Maintenance  Topic Date Due   COVID-19 Vaccine (4 - Moderna series) 11/29/2020   INFLUENZA VACCINE  12/14/2021   PAP SMEAR-Modifier  08/21/2022   TETANUS/TDAP  08/20/2029    Hepatitis C Screening  Completed   HIV Screening  Completed   Pneumococcal Vaccine 69-7 Years old  Aged Out   HPV VACCINES  Aged Out    Health Maintenance  Health Maintenance Due  Topic Date Due   COVID-19 Vaccine (4 - Moderna series) 11/29/2020   INFLUENZA VACCINE  12/14/2021   Colorectal screening: Patient does not require this at this time due to AGE.  Mammogram screening: Patient does not require this at this time due to AGE.  Bone Density Screening: Patient does not require this at this time due to AGE.  Lung Cancer Screening: (Low Dose CT Chest recommended if Age 66-80 years, 30 pack-year currently smoking OR have quit w/in 15years.) does not qualify.   Lung Cancer Screening Referral: N/A  Additional Screening:  Hepatitis C Screening: does qualify; Completed 08/24/2020  Vision Screening: Recommended annual ophthalmology exams for early detection of glaucoma and other disorders of the eye. Is the patient up to date with their annual eye exam?  {YES/NO:21197} Who is the provider or what is the name of the office in which the patient attends annual eye exams? *** If pt is not established with a provider, would they like to be referred to a provider to establish care? {YES/NO:21197}.   Dental Screening: Recommended annual dental exams for proper oral hygiene  Community Resource Referral / Chronic Care Management: CRR required this visit?  No   CCM required this visit?  No      Plan:     I have personally reviewed and noted the following in the patient's chart:   Medical and social history Use of alcohol,  tobacco or illicit drugs  Current medications and supplements including opioid prescriptions. Patient is not currently taking opioid prescriptions. Functional ability and status Nutritional status Physical activity Advanced directives List of other physicians Hospitalizations, surgeries, and ER visits in previous 12 months Vitals Screenings to include  cognitive, depression, and falls Referrals and appointments  In addition, I have reviewed and discussed with patient certain preventive protocols, quality metrics, and best practice recommendations. A written personalized care plan for preventive services as well as general preventive health recommendations were provided to patient.     Clista Bernhardt, [place]   01/05/2022   Nurse Notes: ***    Ms. Nachtigal , Thank you for taking time to come for your Medicare Wellness Visit. I appreciate your ongoing commitment to your health goals. Please review the following plan we discussed and let me know if I can assist you in the future.   These are the goals we discussed:  Goals   None     This is a list of the screening recommended for you and due dates:  Health Maintenance  Topic Date Due   COVID-19 Vaccine (4 - Moderna series) 11/29/2020   Flu Shot  12/14/2021   Pap Smear  08/21/2022   Tetanus Vaccine  08/20/2029   Hepatitis C Screening: USPSTF Recommendation to screen - Ages 18-79 yo.  Completed   HIV Screening  Completed   Pneumococcal Vaccination  Aged Out   HPV Vaccine  Aged Out

## 2022-01-05 NOTE — Telephone Encounter (Signed)
I tried to contact patient to complete Wellness visit. Left VM on patients home phone. I was unable to dial out to the [phone] area code number in patients chart.

## 2022-01-05 NOTE — Progress Notes (Signed)
Subjective:   Melanie Bell is a 31 y.o. female who presents for Medicare Annual (Subsequent) preventive examination.  I connected with  Latice Waitman on 01/05/22 by a audio enabled telemedicine application and verified that I am speaking with the correct person using two identifiers.  Patient Location: Home  Provider Location: Office/Clinic  I discussed the limitations of evaluation and management by telemedicine. The patient expressed understanding and agreed to proceed.   Review of Systems    Defer to PCP       Objective:    Today's Vitals   01/05/22 1537  Weight: 300 lb (136.1 kg)  Height: '5\' 7"'$  (1.702 m)  PainSc: 0-No pain   Body mass index is 46.99 kg/m.     12/01/2021    6:03 PM 11/30/2021   10:36 PM 07/01/2021   11:47 AM 03/18/2021    9:04 AM 01/29/2021    9:01 AM 01/04/2021    3:45 PM 02/12/2020   10:13 AM  Advanced Directives  Does Patient Have a Medical Advance Directive? No No No No No No No  Would patient like information on creating a medical advance directive?   No - Patient declined   No - Patient declined     Current Medications (verified) Outpatient Encounter Medications as of 01/05/2022  Medication Sig   albuterol (VENTOLIN HFA) 108 (90 Base) MCG/ACT inhaler 2 puffs four times a day as needed   ARIPiprazole (ABILIFY) 5 MG tablet Take 5 mg by mouth daily.   cabergoline (DOSTINEX) 0.5 MG tablet Take 1 tablet (0.5 mg total) by mouth 2 (two) times a week.   escitalopram (LEXAPRO) 10 MG tablet Take 10 mg by mouth daily.   fluticasone-salmeterol (ADVAIR HFA) 45-21 MCG/ACT inhaler Inhale 2 puffs into the lungs 2 (two) times daily.   hydrOXYzine (VISTARIL) 50 MG capsule Take 50 mg by mouth 2 (two) times daily as needed. (Patient not taking: Reported on 11/12/2021)   levothyroxine (SYNTHROID) 75 MCG tablet Take 1 tablet (75 mcg total) by mouth as directed. Two tablets on Sundays and Wednesday and 1 tablet rest of the week   lithium carbonate 300 MG  capsule Take 1 capsule (300 mg total) by mouth 2 (two) times daily with a meal.   magnesium oxide (MAG-OX) 400 MG tablet Take 400 mg by mouth daily.   meloxicam (MOBIC) 7.5 MG tablet Take 7.5 mg by mouth daily.   omeprazole (PRILOSEC) 20 MG capsule TAKE 1 CAPSULE (20 MG TOTAL) BY MOUTH DAILY.   paliperidone (INVEGA) 6 MG 24 hr tablet Take 6 mg by mouth every morning.   prazosin (MINIPRESS) 2 MG capsule Take 1 capsule (2 mg total) by mouth at bedtime.   pregabalin (LYRICA) 50 MG capsule Take 50 mg by mouth 2 (two) times daily.   Prenatal Vit-Fe Fumarate-FA (PRENATAL MULTIVITAMIN) TABS tablet Take 1 tablet by mouth daily at 12 noon. (Patient not taking: Reported on 12/01/2021)   sucralfate (CARAFATE) 1 g tablet Take 1 tablet (1 g total) by mouth 4 (four) times daily for 5 days.   [DISCONTINUED] EMGALITY 120 MG/ML SOSY Inject into the skin. (Patient not taking: Reported on 11/12/2021)   No facility-administered encounter medications on file as of 01/05/2022.    Allergies (verified) Betadine [povidone iodine], Fish allergy, Iodine, and Shellfish-derived products   History: Past Medical History:  Diagnosis Date   Allergy    Anxiety    Arthritis    Asthma    Depression    Foreign body aspiration  GERD (gastroesophageal reflux disease)    Hallucinations 09/30/2014   Sizoaffective   Hyperlipidemia    Hypertension    Intentional ingestion of batteries 04/21/2016    2 AAA batteries and 1 thumb tack; intent to hurt herself/notes 62/07/7626   Self-inflicted injury 31/51/7616   Suicidal ideation 01/02/2018   Tardive dyskinesia 10/2014   recent onset   Thyroid disease    Past Surgical History:  Procedure Laterality Date   ABDOMINAL SURGERY     "years ago" to remove foreign objects   APPENDECTOMY     BREAST EXCISIONAL BIOPSY Right 09/03/2007   surgical bx removed fibroadeoma   BREAST LUMPECTOMY Right    COLONOSCOPY WITH PROPOFOL N/A 09/10/2015   Procedure: COLONOSCOPY WITH PROPOFOL;   Surgeon: [name], MD;  Location: [facility] ENDOSCOPY;  Service: Endoscopy;  Laterality: N/A;   ESOPHAGOGASTRODUODENOSCOPY N/A 11/28/2014   Procedure: ESOPHAGOGASTRODUODENOSCOPY (EGD);  Surgeon: [name], MD;  Location: [facility] ENDOSCOPY;  Service: Endoscopy;  Laterality: N/A;   ESOPHAGOGASTRODUODENOSCOPY N/A 02/21/2016   Procedure: ESOPHAGOGASTRODUODENOSCOPY (EGD);  Surgeon: Mauri Pole, MD;  Location: [facility] ENDOSCOPY;  Service: Endoscopy;  Laterality: N/A;   ESOPHAGOGASTRODUODENOSCOPY N/A 04/21/2016   Procedure: ESOPHAGOGASTRODUODENOSCOPY (EGD);  Surgeon: Gatha Mayer, MD;  Location: [facility] ENDOSCOPY;  Service: Endoscopy;  Laterality: N/A;   ESOPHAGOGASTRODUODENOSCOPY N/A 05/06/2016   Procedure: ESOPHAGOGASTRODUODENOSCOPY (EGD);  Surgeon: Jonathon Bellows, MD;  Location: [facility] ENDOSCOPY;  Service: Gastroenterology;  Laterality: N/A;   ESOPHAGOGASTRODUODENOSCOPY N/A 06/13/2016   Procedure: ESOPHAGOGASTRODUODENOSCOPY (EGD);  Surgeon: [name], MD;  Location: [facility] ENDOSCOPY;  Service: Endoscopy;  Laterality: N/A;   ESOPHAGOGASTRODUODENOSCOPY (EGD) WITH PROPOFOL N/A 02/29/2016   Procedure: ESOPHAGOGASTRODUODENOSCOPY (EGD) WITH PROPOFOL;  Surgeon: [name], MD;  Location: [facility] ENDOSCOPY;  Service: Endoscopy;  Laterality: N/A;   ESOPHAGOGASTRODUODENOSCOPY (EGD) WITH PROPOFOL N/A 01/15/2018   Procedure: ESOPHAGOGASTRODUODENOSCOPY (EGD) WITH PROPOFOL;  Surgeon: Daneil Dolin, MD;  Location: [facility] ENDO SUITE;  Service: Endoscopy;  Laterality: N/A;  retrieval forein body   LAPAROTOMY N/A 09/12/2015   Procedure: EXPLORATORY LAPAROTOMY;  Surgeon: Florene Glen, MD;  Location: [facility] ORS;  Service: General;  Laterality: N/A;   SIGMOIDOSCOPY N/A 09/12/2015   Procedure: Lonell Face;  Surgeon: Florene Glen, MD;  Location: [facility] ORS;  Service: General;  Laterality: N/A;   WISDOM TOOTH EXTRACTION     Family History  Problem Relation Age of Onset   Depression Mother    Hypertension Mother     Sleep apnea Mother    Asthma Mother    COPD Mother    Diabetes Mother    Anxiety disorder Mother    Arthritis Mother    Breast cancer Maternal Aunt        20's   Stroke Maternal Grandmother    Social History   Socioeconomic History   Marital status: Married    Spouse name: Not on file   Number of children: 0   Years of education: 12   Highest education level: Not on file  Occupational History   Occupation: unemployed  Tobacco Use   Smoking status: Former    Packs/day: 0.50    Years: 3.00    Total pack years: 1.50    Types: Cigarettes    Quit date: 08/14/2016    Years since quitting: 5.3   Smokeless tobacco: Never  Vaping Use   Vaping Use: Never used  Substance and Sexual Activity   Alcohol use: No   Drug use: No   Sexual activity: Yes    Partners: Male  Birth control/protection: None  Other Topics Concern   Not on file  Social History Narrative   Right handed    One story home   Drinks occasional caffeine   Social Determinants of Health   Financial Resource Strain: Low Risk  (01/04/2021)   Overall Financial Resource Strain (CARDIA)    Difficulty of Paying Living Expenses: Not very hard  Food Insecurity: No Food Insecurity (01/04/2021)   Hunger Vital Sign    Worried About Running Out of Food in the Last Year: Never true    Ran Out of Food in the Last Year: Never true  Transportation Needs: No Transportation Needs (01/04/2021)   PRAPARE - Hydrologist (Medical): No    Lack of Transportation (Non-Medical): No  Physical Activity: Inactive (01/04/2021)   Exercise Vital Sign    Days of Exercise per Week: 0 days    Minutes of Exercise per Session: 0 min  Stress: Not on file  Social Connections: Moderately Integrated (01/04/2021)   Social Connection and Isolation Panel [NHANES]    Frequency of Communication with Friends and Family: More than three times a week    Frequency of Social Gatherings with Friends and Family: More than three  times a week    Attends Religious Services: More than 4 times per year    Active Member of [employer] or Organizations: No    Attends Archivist Meetings: Never    Marital Status: Living with partner    Tobacco Counseling Counseling given: Not Answered   Clinical Intake:     Pain Score: 0-No pain           Diabetic? No.         Activities of Daily Living    09/03/2021    9:15 AM  In your present state of health, do you have any difficulty performing the following activities:  Hearing? 0  Vision? 0  Difficulty concentrating or making decisions? 0  Walking or climbing stairs? 0  Dressing or bathing? 0  Doing errands, shopping? 0    Patient Care Team: Glean Hess, MD as PCP - General (Internal Medicine) Charlcie Cradle, MD (Psychiatry) Quintin Alto, MD as Consulting Physician (Rheumatology) Shamleffer, Melanie Crazier, MD as Consulting Physician (Endocrinology) Philip Aspen, CNM as Midwife (Certified Nurse Midwife) Vladimir Crofts, MD as Consulting Physician (Neurology)  Indicate any recent Medical Services you may have received from other than [facility] providers in the past year (date may be approximate).     Assessment:   This is a routine wellness examination for [place].  Hearing/Vision screen No results found.  Dietary issues and exercise activities discussed:     Goals Addressed   None   Depression Screen    09/03/2021    9:15 AM 03/05/2021    9:04 AM 01/04/2021    3:43 PM 12/01/2020    2:52 PM  PHQ 2/9 Scores  PHQ - 2 Score 0 0 0 0  PHQ- 9 Score 0 0 0 0    Fall Risk    09/03/2021    9:15 AM 01/04/2021    3:48 PM 12/01/2020    2:53 PM 02/12/2020   10:13 AM 09/19/2019   12:52 PM  Fall Risk   Falls in the past year? 0 1 0 0 0  Number falls in past yr: 0 1 1 0 0  Injury with Fall? 0 0 1 0 0  Risk for fall due to : No Fall Risks History of  fall(s);Orthopedic patient History of fall(s);Impaired balance/gait    Follow up Falls  evaluation completed Falls prevention discussed Falls evaluation completed      FALL RISK PREVENTION PERTAINING TO THE HOME:  Any stairs in or around the home? Yes  Front and back door of home. If so, are there any without handrails? Yes  Home free of loose throw rugs in walkways, pet beds, electrical cords, etc? Yes  Adequate lighting in your home to reduce risk of falls? Yes   ASSISTIVE DEVICES UTILIZED TO PREVENT FALLS:  Life alert? No  Use of a cane, walker or w/c? Yes  - Cane and [employer] bars in the bathroom? Yes  Shower chair or bench in shower? Yes  Elevated toilet seat or a handicapped toilet? Yes   TIMED UP AND GO:  Was the test performed? No .   Cognitive Function:        Immunizations Immunization History  Administered Date(s) Administered   Influenza,inj,Quad PF,6+ Mos 03/05/2021   Influenza-Unspecified 05/31/2016   Moderna Sars-Covid-2 Vaccination 12/29/2019, 01/26/2020, 10/04/2020   PPD Test 12/05/2017   Tdap 08/21/2019    TDAP status: Up to date  Flu Vaccine status: Due, Education has been provided regarding the importance of this vaccine. Advised may receive this vaccine at local pharmacy or Health Dept. Aware to provide a copy of the vaccination record if obtained from local pharmacy or Health Dept. Verbalized acceptance and understanding.  Pneumococcal vaccine status: Up to date  Covid-19 vaccine status: Completed vaccines  Qualifies for Shingles Vaccine? No   Zostavax completed No   Shingrix Completed?: No.    Education has been provided regarding the importance of this vaccine. Patient has been advised to call insurance company to determine out of pocket expense if they have not yet received this vaccine. Advised may also receive vaccine at local pharmacy or Health Dept. Verbalized acceptance and understanding.  Screening Tests Health Maintenance  Topic Date Due   COVID-19 Vaccine (4 - Moderna series) 11/29/2020   INFLUENZA VACCINE   12/14/2021   PAP SMEAR-Modifier  08/21/2022   TETANUS/TDAP  08/20/2029   Hepatitis C Screening  Completed   HIV Screening  Completed   Pneumococcal Vaccine 25-25 Years old  Aged Out   HPV VACCINES  Aged Out    Health Maintenance  Health Maintenance Due  Topic Date Due   COVID-19 Vaccine (4 - Moderna series) 11/29/2020   INFLUENZA VACCINE  12/14/2021    Colorectal Screening: Patient does not qualify due to age.  Mammogram Screening: Patient does not qualify due to age.  Bone Density Status: Patient does not qualify due to age.  Lung Cancer Screening: (Low Dose CT Chest recommended if Age 56-80 years, 30 pack-year currently smoking OR have quit w/in 15years.) does not qualify.   Lung Cancer Screening Referral: N/A  Additional Screening:  Hepatitis C Screening: does not qualify.  Vision Screening: Recommended annual ophthalmology exams for early detection of glaucoma and other disorders of the eye. Is the patient up to date with their annual eye exam?  Yes  Who is the provider or what is the name of the office in which the patient attends annual eye exams? [facility] If pt is not established with a provider, would they like to be referred to a provider to establish care?  N/A .   Dental Screening: Recommended annual dental exams for proper oral hygiene  Community Resource Referral / Chronic Care Management: CRR required this visit?  No  CCM required this visit?  No      Plan:     I have personally reviewed and noted the following in the patient's chart:   Medical and social history Use of alcohol, tobacco or illicit drugs  Current medications and supplements including opioid prescriptions. Patient is not currently taking opioid prescriptions. Functional ability and status Nutritional status Physical activity Advanced directives List of other physicians Hospitalizations, surgeries, and ER visits in previous 12 months Vitals Screenings to include  cognitive, depression, and falls Referrals and appointments  In addition, I have reviewed and discussed with patient certain preventive protocols, quality metrics, and best practice recommendations. A written personalized care plan for preventive services as well as general preventive health recommendations were provided to patient.     Clista Bernhardt, [place]   01/05/2022   Nurse Notes: None.    Ms. Stanek , Thank you for taking time to come for your Medicare Wellness Visit. I appreciate your ongoing commitment to your health goals. Please review the following plan we discussed and let me know if I can assist you in the future.   These are the goals we discussed:  Goals      DIET - INCREASE WATER INTAKE     Eat Healthy        This is a list of the screening recommended for you and due dates:  Health Maintenance  Topic Date Due   COVID-19 Vaccine (4 - Moderna series) 11/29/2020   Flu Shot  12/14/2021   Pap Smear  08/21/2022   Tetanus Vaccine  08/20/2029   Hepatitis C Screening: USPSTF Recommendation to screen - Ages 18-79 yo.  Completed   HIV Screening  Completed   Pneumococcal Vaccination  Aged Out   HPV Vaccine  Aged Out

## 2022-01-12 ENCOUNTER — Encounter: Payer: Self-pay | Admitting: Internal Medicine

## 2022-01-12 ENCOUNTER — Ambulatory Visit (INDEPENDENT_AMBULATORY_CARE_PROVIDER_SITE_OTHER): Payer: Medicaid Other | Admitting: Internal Medicine

## 2022-01-12 VITALS — BP 120/84 | HR 89 | Ht 66.0 in | Wt 290.0 lb

## 2022-01-12 DIAGNOSIS — Z23 Encounter for immunization: Secondary | ICD-10-CM

## 2022-01-12 DIAGNOSIS — K8020 Calculus of gallbladder without cholecystitis without obstruction: Secondary | ICD-10-CM

## 2022-01-12 DIAGNOSIS — R109 Unspecified abdominal pain: Secondary | ICD-10-CM | POA: Diagnosis not present

## 2022-01-12 DIAGNOSIS — N926 Irregular menstruation, unspecified: Secondary | ICD-10-CM | POA: Diagnosis not present

## 2022-01-12 LAB — POCT URINALYSIS DIPSTICK
Bilirubin, UA: NEGATIVE
Blood, UA: NEGATIVE
Glucose, UA: NEGATIVE
Ketones, UA: NEGATIVE
Leukocytes, UA: NEGATIVE
Nitrite, UA: NEGATIVE
Protein, UA: NEGATIVE
Spec Grav, UA: 1.015 (ref 1.010–1.025)
Urobilinogen, UA: 0.2 E.U./dL
pH, UA: 6 (ref 5.0–8.0)

## 2022-01-12 LAB — POCT URINE PREGNANCY: Preg Test, Ur: NEGATIVE

## 2022-01-12 NOTE — Progress Notes (Signed)
Date:  01/12/2022   Name:  Melanie Bell   DOB:  Oct 08, 1990   MRN:  [id number]   Chief Complaint: Diverticulosis, Abdominal Pain, and Possible Pregnancy  Abdominal Pain This is a new problem. The pain is located in the RLQ. The pain is at a severity of 3/10. The pain is mild. The quality of the pain is aching, cramping and sharp. The abdominal pain radiates to the RUQ. Associated symptoms include nausea and vomiting (resolved). Pertinent negatives include no fever or frequency. Nothing aggravates the pain.  Possible Pregnancy This is a new problem. Associated symptoms include abdominal pain, nausea and vomiting (resolved). Pertinent negatives include no chest pain, chills, coughing, fatigue or fever. The symptoms are aggravated by eating. She has tried nothing for the symptoms.  Last menses was 7/2 - none this month, no spotting or cramping.  Lab Results  Component Value Date   NA 141 12/01/2021   K 3.9 12/01/2021   CO2 22 12/01/2021   GLUCOSE 92 12/01/2021   BUN 11 12/01/2021   CREATININE 0.66 12/01/2021   CALCIUM 9.7 12/01/2021   GFRNONAA >60 12/01/2021   Lab Results  Component Value Date   CHOL 170 09/30/2021   HDL 35 (L) 09/30/2021   LDLCALC 109 (H) 09/30/2021   TRIG 145 09/30/2021   CHOLHDL 4.9 (H) 09/30/2021   Lab Results  Component Value Date   TSH 1.75 11/01/2021   Lab Results  Component Value Date   HGBA1C 5.1 11/23/2017   Lab Results  Component Value Date   WBC 11.4 (H) 12/01/2021   HGB 14.4 12/01/2021   HCT 44.2 12/01/2021   MCV 90.9 12/01/2021   PLT 266 12/01/2021   Lab Results  Component Value Date   ALT 20 12/01/2021   AST 25 12/01/2021   ALKPHOS 76 12/01/2021   BILITOT 0.6 12/01/2021   No results found for: "25OHVITD2", "25OHVITD3", "VD25OH"   Review of Systems  Constitutional:  Negative for chills, fatigue and fever.  Respiratory:  Negative for cough, chest tightness and shortness of breath.   Cardiovascular:  Negative for chest  pain.  Gastrointestinal:  Positive for abdominal pain, nausea and vomiting (resolved).  Genitourinary:  Positive for menstrual problem. Negative for frequency and urgency.  Psychiatric/Behavioral:  Negative for dysphoric mood. The patient is not nervous/anxious.     Patient Active Problem List   Diagnosis Date Noted   Encounter for supervision of other normal pregnancy, first trimester 11/12/2021   Morbid obesity ([place]) 09/30/2021   Acquired hypothyroidism 06/25/2021   Migraine without aura and without status migrainosus, not intractable 03/05/2021   Mild intermittent asthma without complication 01/60/1093   Gastroesophageal reflux disease 12/01/2020   Hypoglycemia 12/01/2020   Rheumatoid factor positive 12/09/2019   SS-A antibody positive 10/28/2019   Chronic pain of both knees 10/28/2019   Hyperprolactinemia ([place]) 08/02/2019   Schizoaffective disorder, depressive type ([place]) 02/02/2018   Suicide attempt ([place]) 01/16/2018   Dysfunctional uterine bleeding 01/06/2018   Borderline personality disorder ([place]) 01/04/2018   PTSD (post-traumatic stress disorder) 01/03/2018   ASCUS of cervix with negative high risk HPV 08/28/2017   Malingering 05/06/2016    Allergies  Allergen Reactions   Betadine [Povidone Iodine] Other (See Comments)    Reaction:  Unknown    Fish Allergy Hives   Iodine Rash   Shellfish-Derived Products Other (See Comments)    Reaction:  Unknown     Past Surgical History:  Procedure Laterality Date   ABDOMINAL SURGERY     "  years ago" to remove foreign objects   APPENDECTOMY     BREAST EXCISIONAL BIOPSY Right 09/03/2007   surgical bx removed fibroadeoma   BREAST LUMPECTOMY Right    COLONOSCOPY WITH PROPOFOL N/A 09/10/2015   Procedure: COLONOSCOPY WITH PROPOFOL;  Surgeon: [name], MD;  Location: [facility] ENDOSCOPY;  Service: Endoscopy;  Laterality: N/A;   ESOPHAGOGASTRODUODENOSCOPY N/A 11/28/2014   Procedure: ESOPHAGOGASTRODUODENOSCOPY (EGD);  Surgeon:  [name], MD;  Location: [facility] ENDOSCOPY;  Service: Endoscopy;  Laterality: N/A;   ESOPHAGOGASTRODUODENOSCOPY N/A 02/21/2016   Procedure: ESOPHAGOGASTRODUODENOSCOPY (EGD);  Surgeon: Mauri Pole, MD;  Location: [facility] ENDOSCOPY;  Service: Endoscopy;  Laterality: N/A;   ESOPHAGOGASTRODUODENOSCOPY N/A 04/21/2016   Procedure: ESOPHAGOGASTRODUODENOSCOPY (EGD);  Surgeon: Gatha Mayer, MD;  Location: [facility] ENDOSCOPY;  Service: Endoscopy;  Laterality: N/A;   ESOPHAGOGASTRODUODENOSCOPY N/A 05/06/2016   Procedure: ESOPHAGOGASTRODUODENOSCOPY (EGD);  Surgeon: Jonathon Bellows, MD;  Location: [facility] ENDOSCOPY;  Service: Gastroenterology;  Laterality: N/A;   ESOPHAGOGASTRODUODENOSCOPY N/A 06/13/2016   Procedure: ESOPHAGOGASTRODUODENOSCOPY (EGD);  Surgeon: [name], MD;  Location: [facility] ENDOSCOPY;  Service: Endoscopy;  Laterality: N/A;   ESOPHAGOGASTRODUODENOSCOPY (EGD) WITH PROPOFOL N/A 02/29/2016   Procedure: ESOPHAGOGASTRODUODENOSCOPY (EGD) WITH PROPOFOL;  Surgeon: [name], MD;  Location: [facility] ENDOSCOPY;  Service: Endoscopy;  Laterality: N/A;   ESOPHAGOGASTRODUODENOSCOPY (EGD) WITH PROPOFOL N/A 01/15/2018   Procedure: ESOPHAGOGASTRODUODENOSCOPY (EGD) WITH PROPOFOL;  Surgeon: Daneil Dolin, MD;  Location: [facility] ENDO SUITE;  Service: Endoscopy;  Laterality: N/A;  retrieval forein body   LAPAROTOMY N/A 09/12/2015   Procedure: EXPLORATORY LAPAROTOMY;  Surgeon: Florene Glen, MD;  Location: [facility] ORS;  Service: General;  Laterality: N/A;   SIGMOIDOSCOPY N/A 09/12/2015   Procedure: [place] Face;  Surgeon: Florene Glen, MD;  Location: [facility] ORS;  Service: General;  Laterality: N/A;   WISDOM TOOTH EXTRACTION      Social History   Tobacco Use   Smoking status: Former    Packs/day: 0.50    Years: 3.00    Total pack years: 1.50    Types: Cigarettes    Quit date: 08/14/2016    Years since quitting: 5.4   Smokeless tobacco: Never  Vaping Use   Vaping Use: Never used  Substance Use Topics   Alcohol  use: No   Drug use: No     Medication list has been reviewed and updated.  Current Meds  Medication Sig   albuterol (VENTOLIN HFA) 108 (90 Base) MCG/ACT inhaler 2 puffs four times a day as needed   ARIPiprazole (ABILIFY) 5 MG tablet Take 5 mg by mouth daily.   cabergoline (DOSTINEX) 0.5 MG tablet Take 1 tablet (0.5 mg total) by mouth 2 (two) times a week.   escitalopram (LEXAPRO) 10 MG tablet Take 10 mg by mouth daily.   fluticasone-salmeterol (ADVAIR HFA) 45-21 MCG/ACT inhaler Inhale 2 puffs into the lungs 2 (two) times daily.   hydrOXYzine (VISTARIL) 50 MG capsule Take 50 mg by mouth 2 (two) times daily as needed.   levothyroxine (SYNTHROID) 75 MCG tablet Take 1 tablet (75 mcg total) by mouth as directed. Two tablets on Sundays and Wednesday and 1 tablet rest of the week   lithium carbonate 300 MG capsule Take 1 capsule (300 mg total) by mouth 2 (two) times daily with a meal.   magnesium oxide (MAG-OX) 400 MG tablet Take 400 mg by mouth daily.   meloxicam (MOBIC) 7.5 MG tablet Take 7.5 mg by mouth daily.   omeprazole (PRILOSEC) 20 MG capsule TAKE 1 CAPSULE (20 MG TOTAL) BY  MOUTH DAILY.   prazosin (MINIPRESS) 2 MG capsule Take 1 capsule (2 mg total) by mouth at bedtime.   pregabalin (LYRICA) 50 MG capsule Take 50 mg by mouth 2 (two) times daily.   Prenatal Vit-Fe Fumarate-FA (PRENATAL MULTIVITAMIN) TABS tablet Take 1 tablet by mouth daily at 12 noon.   SUMAtriptan (IMITREX) 20 MG/ACT nasal spray SMARTSIG:Both Nares   topiramate (TOPAMAX) 25 MG tablet Take 25 mg by mouth 2 (two) times daily.       09/03/2021    9:15 AM 03/05/2021    9:04 AM 12/01/2020    2:53 PM  GAD 7 : Generalized Anxiety Score  Nervous, Anxious, on Edge 0 0 0  Control/stop worrying 0 0 0  Worry too much - different things 0 0 0  Trouble relaxing 0 0 0  Restless 0 0 0  Easily annoyed or irritable 0 0 0  Afraid - awful might happen 0 0 0  Total GAD 7 Score 0 0 0  Anxiety Difficulty  Not difficult at all Not  difficult at all       01/05/2022    3:42 PM 09/03/2021    9:15 AM 03/05/2021    9:04 AM  Depression screen PHQ 2/9  Decreased Interest 0 0 0  Down, Depressed, Hopeless 0 0 0  PHQ - 2 Score 0 0 0  Altered sleeping 0 0 0  Tired, decreased energy 0 0 0  Change in appetite 0 0 0  Feeling bad or failure about yourself  0 0 0  Trouble concentrating 0 0 0  Moving slowly or fidgety/restless 0 0 0  Suicidal thoughts 0 0 0  PHQ-9 Score 0 0 0  Difficult doing work/chores Not difficult at all Not difficult at all Not difficult at all    BP Readings from Last 3 Encounters:  01/12/22 120/84  12/01/21 117/72  12/01/21 124/72    Physical Exam Vitals and nursing note reviewed.  Constitutional:      General: She is not in acute distress.    Appearance: She is well-developed.  HENT:     Head: Normocephalic and atraumatic.  Pulmonary:     Effort: Pulmonary effort is normal. No respiratory distress.  Abdominal:     General: Abdomen is flat. Bowel sounds are normal.     Palpations: Abdomen is soft. There is no hepatomegaly or splenomegaly.     Tenderness: There is abdominal tenderness in the right upper quadrant. There is no right CVA tenderness, left CVA tenderness, guarding or rebound.  Skin:    General: Skin is warm and dry.     Findings: No rash.  Neurological:     Mental Status: She is alert and oriented to person, place, and time.  Psychiatric:        Mood and Affect: Mood normal.        Behavior: Behavior normal.     Wt Readings from Last 3 Encounters:  01/12/22 290 lb (131.5 kg)  01/05/22 300 lb (136.1 kg)  12/01/21 300 lb (136.1 kg)    BP 120/84   Pulse 89   Ht '5\' 6"'$  (1.676 m)   Wt 290 lb (131.5 kg)   LMP 11/14/2021   SpO2 96%   BMI 46.81 kg/m   Assessment and Plan: 1. Gall bladder stones Moderately and persistently symptomatic Continue low fat diet Refer to Gen surgery - Ambulatory referral to General Surgery  2. Abdominal pain, unspecified abdominal  location UA negative General surgery consult for gall stones -  POCT urinalysis dipstick  3. Missed period HCG negative. - POCT urine pregnancy  4. Need for influenza vaccination - Flu Vaccine QUAD 52moIM (Fluarix, Fluzone & Alfiuria Quad PF)   Partially dictated using DEditor, commissioning Any errors are unintentional.  LHalina Maidens MD [facility]  01/12/2022

## 2022-01-14 ENCOUNTER — Encounter: Payer: Self-pay | Admitting: Internal Medicine

## 2022-01-14 ENCOUNTER — Observation Stay: Payer: Medicaid Other | Admitting: Anesthesiology

## 2022-01-14 ENCOUNTER — Emergency Department: Payer: Medicaid Other

## 2022-01-14 ENCOUNTER — Other Ambulatory Visit: Payer: Self-pay

## 2022-01-14 ENCOUNTER — Ambulatory Visit: Admit: 2022-01-14 | Payer: Medicaid Other | Admitting: Internal Medicine

## 2022-01-14 ENCOUNTER — Encounter: Payer: Self-pay | Admitting: Intensive Care

## 2022-01-14 ENCOUNTER — Encounter
Admission: EM | Disposition: A | Discharge status: Home / Self Care | Payer: Self-pay | Source: Home / Self Care | Attending: Emergency Medicine

## 2022-01-14 ENCOUNTER — Observation Stay
Admission: EM | Admit: 2022-01-14 | Discharge: 2022-01-15 | Disposition: A | Discharge status: Home / Self Care | Payer: Medicaid Other | Attending: Internal Medicine | Admitting: Internal Medicine

## 2022-01-14 DIAGNOSIS — K920 Hematemesis: Principal | ICD-10-CM | POA: Diagnosis present

## 2022-01-14 DIAGNOSIS — Z87891 Personal history of nicotine dependence: Secondary | ICD-10-CM | POA: Insufficient documentation

## 2022-01-14 DIAGNOSIS — E039 Hypothyroidism, unspecified: Secondary | ICD-10-CM | POA: Insufficient documentation

## 2022-01-14 DIAGNOSIS — K922 Gastrointestinal hemorrhage, unspecified: Secondary | ICD-10-CM

## 2022-01-14 DIAGNOSIS — I10 Essential (primary) hypertension: Secondary | ICD-10-CM | POA: Insufficient documentation

## 2022-01-14 DIAGNOSIS — Z79899 Other long term (current) drug therapy: Secondary | ICD-10-CM | POA: Insufficient documentation

## 2022-01-14 DIAGNOSIS — R1010 Upper abdominal pain, unspecified: Secondary | ICD-10-CM | POA: Diagnosis not present

## 2022-01-14 DIAGNOSIS — R112 Nausea with vomiting, unspecified: Secondary | ICD-10-CM | POA: Diagnosis present

## 2022-01-14 DIAGNOSIS — J45909 Unspecified asthma, uncomplicated: Secondary | ICD-10-CM | POA: Insufficient documentation

## 2022-01-14 DIAGNOSIS — Z20822 Contact with and (suspected) exposure to covid-19: Secondary | ICD-10-CM | POA: Diagnosis not present

## 2022-01-14 HISTORY — PX: ESOPHAGOGASTRODUODENOSCOPY: SHX5428

## 2022-01-14 HISTORY — DX: Hematemesis: K92.0

## 2022-01-14 LAB — URINE DRUG SCREEN, QUALITATIVE (ARMC ONLY)
Amphetamines, Ur Screen: NOT DETECTED
Barbiturates, Ur Screen: NOT DETECTED
Benzodiazepine, Ur Scrn: NOT DETECTED
Cannabinoid 50 Ng, Ur ~~LOC~~: NOT DETECTED
Cocaine Metabolite,Ur ~~LOC~~: NOT DETECTED
MDMA (Ecstasy)Ur Screen: NOT DETECTED
Methadone Scn, Ur: NOT DETECTED
Opiate, Ur Screen: NOT DETECTED
Phencyclidine (PCP) Ur S: NOT DETECTED
Tricyclic, Ur Screen: NOT DETECTED

## 2022-01-14 LAB — TYPE AND SCREEN
ABO/RH(D): B POS
Antibody Screen: NEGATIVE

## 2022-01-14 LAB — URINALYSIS, ROUTINE W REFLEX MICROSCOPIC
Bilirubin Urine: NEGATIVE
Glucose, UA: NEGATIVE mg/dL
Hgb urine dipstick: NEGATIVE
Ketones, ur: NEGATIVE mg/dL
Leukocytes,Ua: NEGATIVE
Nitrite: NEGATIVE
Protein, ur: NEGATIVE mg/dL
Specific Gravity, Urine: 1.008 (ref 1.005–1.030)
pH: 8 (ref 5.0–8.0)

## 2022-01-14 LAB — CBC WITH DIFFERENTIAL/PLATELET
Abs Immature Granulocytes: 0.03 10*3/uL (ref 0.00–0.07)
Basophils Absolute: 0 10*3/uL (ref 0.0–0.1)
Basophils Relative: 0 %
Eosinophils Absolute: 0.1 10*3/uL (ref 0.0–0.5)
Eosinophils Relative: 1 %
HCT: 41 % (ref 36.0–46.0)
Hemoglobin: 13.4 g/dL (ref 12.0–15.0)
Immature Granulocytes: 0 %
Lymphocytes Relative: 17 %
Lymphs Abs: 1.6 10*3/uL (ref 0.7–4.0)
MCH: 29.5 pg (ref 26.0–34.0)
MCHC: 32.7 g/dL (ref 30.0–36.0)
MCV: 90.3 fL (ref 80.0–100.0)
Monocytes Absolute: 0.6 10*3/uL (ref 0.1–1.0)
Monocytes Relative: 6 %
Neutro Abs: 6.9 10*3/uL (ref 1.7–7.7)
Neutrophils Relative %: 76 %
Platelets: 219 10*3/uL (ref 150–400)
RBC: 4.54 MIL/uL (ref 3.87–5.11)
RDW: 13.3 % (ref 11.5–15.5)
WBC: 9.3 10*3/uL (ref 4.0–10.5)
nRBC: 0 % (ref 0.0–0.2)

## 2022-01-14 LAB — COMPREHENSIVE METABOLIC PANEL
ALT: 12 U/L (ref 0–44)
AST: 17 U/L (ref 15–41)
Albumin: 3.9 g/dL (ref 3.5–5.0)
Alkaline Phosphatase: 80 U/L (ref 38–126)
Anion gap: 4 — ABNORMAL LOW (ref 5–15)
BUN: 12 mg/dL (ref 6–20)
CO2: 22 mmol/L (ref 22–32)
Calcium: 9.6 mg/dL (ref 8.9–10.3)
Chloride: 114 mmol/L — ABNORMAL HIGH (ref 98–111)
Creatinine, Ser: 0.6 mg/dL (ref 0.44–1.00)
GFR, Estimated: >60 mL/min (ref >60)
Glucose, Bld: 107 mg/dL — ABNORMAL HIGH (ref 70–99)
Potassium: 4 mmol/L (ref 3.5–5.1)
Sodium: 140 mmol/L (ref 135–145)
Total Bilirubin: 0.8 mg/dL (ref 0.3–1.2)
Total Protein: 7.5 g/dL (ref 6.5–8.1)

## 2022-01-14 LAB — SARS CORONAVIRUS 2 BY RT PCR: SARS Coronavirus 2 by RT PCR: NEGATIVE

## 2022-01-14 LAB — LITHIUM LEVEL: Lithium Lvl: 0.13 mmol/L — ABNORMAL LOW (ref 0.60–1.20)

## 2022-01-14 LAB — TROPONIN I (HIGH SENSITIVITY)
Troponin I (High Sensitivity): 2 ng/L (ref <18)
Troponin I (High Sensitivity): <2 ng/L (ref <18)

## 2022-01-14 LAB — PROTIME-INR
INR: 1 (ref 0.8–1.2)
Prothrombin Time: 13.4 seconds (ref 11.4–15.2)

## 2022-01-14 LAB — LIPASE, BLOOD: Lipase: 32 U/L (ref 11–51)

## 2022-01-14 LAB — HCG, QUANTITATIVE, PREGNANCY: hCG, Beta Chain, Quant, S: <1 m[IU]/mL (ref <5)

## 2022-01-14 LAB — POC URINE PREG, ED: Preg Test, Ur: NEGATIVE

## 2022-01-14 LAB — TSH: TSH: 0.773 u[IU]/mL (ref 0.350–4.500)

## 2022-01-14 LAB — RETICULOCYTES
Immature Retic Fract: 10.9 % (ref 2.3–15.9)
RBC.: 4.77 MIL/uL (ref 3.87–5.11)
Retic Count, Absolute: 83.5 10*3/uL (ref 19.0–186.0)
Retic Ct Pct: 1.8 % (ref 0.4–3.1)

## 2022-01-14 LAB — HIV ANTIBODY (ROUTINE TESTING W REFLEX): HIV Screen 4th Generation wRfx: NONREACTIVE

## 2022-01-14 SURGERY — EGD (ESOPHAGOGASTRODUODENOSCOPY)
Anesthesia: General

## 2022-01-14 MED ORDER — TRAZODONE HCL 50 MG PO TABS
50.0000 mg | ORAL_TABLET | Freq: Every evening | ORAL | Status: DC | PRN
  Administered 2022-01-14: 50 mg via ORAL
  Filled 2022-01-14: qty 1

## 2022-01-14 MED ORDER — ACETAMINOPHEN 325 MG PO TABS: 650.0000 mg | ORAL_TABLET | Freq: Four times a day (QID) | ORAL | Status: DC | PRN

## 2022-01-14 MED ORDER — ALBUTEROL SULFATE (2.5 MG/3ML) 0.083% IN NEBU: 3.0000 mL | INHALATION_SOLUTION | Freq: Four times a day (QID) | RESPIRATORY_TRACT | Status: DC | PRN

## 2022-01-14 MED ORDER — LEVOTHYROXINE SODIUM 75 MCG PO TABS
75.0000 ug | ORAL_TABLET | Freq: Every day | ORAL | Status: DC
  Administered 2022-01-15: 75 ug via ORAL
  Filled 2022-01-14: qty 1

## 2022-01-14 MED ORDER — ONDANSETRON HCL 4 MG/2ML IJ SOLN: 4.0000 mg | Freq: Four times a day (QID) | INTRAMUSCULAR | Status: DC | PRN

## 2022-01-14 MED ORDER — PROPOFOL 10 MG/ML IV BOLUS
INTRAVENOUS | Status: DC | PRN
  Administered 2022-01-14: 100 mg via INTRAVENOUS

## 2022-01-14 MED ORDER — PANTOPRAZOLE SODIUM 40 MG IV SOLR
40.0000 mg | Freq: Once | INTRAVENOUS | Status: AC
  Administered 2022-01-14: 40 mg via INTRAVENOUS
  Filled 2022-01-14: qty 10

## 2022-01-14 MED ORDER — HYDROXYZINE HCL 25 MG PO TABS: 50.0000 mg | ORAL_TABLET | Freq: Two times a day (BID) | ORAL | Status: DC | PRN

## 2022-01-14 MED ORDER — LITHIUM CARBONATE 300 MG PO CAPS
300.0000 mg | ORAL_CAPSULE | Freq: Two times a day (BID) | ORAL | Status: DC
  Administered 2022-01-14 – 2022-01-15 (×2): 300 mg via ORAL
  Filled 2022-01-14 (×3): qty 1

## 2022-01-14 MED ORDER — ESCITALOPRAM OXALATE 10 MG PO TABS
10.0000 mg | ORAL_TABLET | Freq: Every day | ORAL | Status: DC
  Administered 2022-01-14 – 2022-01-15 (×2): 10 mg via ORAL
  Filled 2022-01-14 (×2): qty 1

## 2022-01-14 MED ORDER — PROPOFOL 500 MG/50ML IV EMUL
INTRAVENOUS | Status: DC | PRN
  Administered 2022-01-14: 200 ug/kg/min via INTRAVENOUS

## 2022-01-14 MED ORDER — PRAZOSIN HCL 2 MG PO CAPS
2.0000 mg | ORAL_CAPSULE | Freq: Every day | ORAL | Status: DC
  Administered 2022-01-14: 2 mg via ORAL
  Filled 2022-01-14: qty 1

## 2022-01-14 MED ORDER — TOPIRAMATE 25 MG PO TABS
25.0000 mg | ORAL_TABLET | Freq: Two times a day (BID) | ORAL | Status: DC
  Administered 2022-01-14 – 2022-01-15 (×2): 25 mg via ORAL
  Filled 2022-01-14 (×2): qty 1

## 2022-01-14 MED ORDER — ONDANSETRON HCL 4 MG/2ML IJ SOLN
4.0000 mg | Freq: Once | INTRAMUSCULAR | Status: AC
Start: 2022-01-14 — End: 2022-01-14
  Administered 2022-01-14: 4 mg via INTRAVENOUS
  Filled 2022-01-14: qty 2

## 2022-01-14 MED ORDER — ONDANSETRON HCL 4 MG PO TABS: 4.0000 mg | ORAL_TABLET | Freq: Four times a day (QID) | ORAL | Status: DC | PRN

## 2022-01-14 MED ORDER — PROPOFOL 10 MG/ML IV BOLUS
INTRAVENOUS | Status: AC
  Filled 2022-01-14: qty 20

## 2022-01-14 MED ORDER — FENTANYL CITRATE PF 50 MCG/ML IJ SOSY
50.0000 ug | PREFILLED_SYRINGE | Freq: Once | INTRAMUSCULAR | Status: AC
  Administered 2022-01-14: 50 ug via INTRAVENOUS
  Filled 2022-01-14: qty 1

## 2022-01-14 MED ORDER — ARIPIPRAZOLE 5 MG PO TABS
5.0000 mg | ORAL_TABLET | Freq: Every day | ORAL | Status: DC
  Administered 2022-01-14 – 2022-01-15 (×2): 5 mg via ORAL
  Filled 2022-01-14 (×3): qty 1

## 2022-01-14 MED ORDER — PANTOPRAZOLE SODIUM 40 MG IV SOLR
40.0000 mg | Freq: Two times a day (BID) | INTRAVENOUS | Status: DC
  Administered 2022-01-14 – 2022-01-15 (×2): 40 mg via INTRAVENOUS
  Filled 2022-01-14 (×2): qty 10

## 2022-01-14 MED ORDER — MOMETASONE FURO-FORMOTEROL FUM 100-5 MCG/ACT IN AERO
2.0000 | INHALATION_SPRAY | Freq: Two times a day (BID) | RESPIRATORY_TRACT | Status: DC
  Administered 2022-01-14 – 2022-01-15 (×2): 2 via RESPIRATORY_TRACT
  Filled 2022-01-14: qty 8.8

## 2022-01-14 MED ORDER — ACETAMINOPHEN 650 MG RE SUPP: 650.0000 mg | Freq: Four times a day (QID) | RECTAL | Status: DC | PRN

## 2022-01-14 MED ORDER — SODIUM CHLORIDE 0.9 % IV SOLN: INTRAVENOUS | Status: DC

## 2022-01-14 MED ORDER — PREGABALIN 50 MG PO CAPS
50.0000 mg | ORAL_CAPSULE | Freq: Two times a day (BID) | ORAL | Status: DC
  Administered 2022-01-14 – 2022-01-15 (×2): 50 mg via ORAL
  Filled 2022-01-14 (×2): qty 1

## 2022-01-14 NOTE — H&P (Signed)
History and Physical    Melanie Bell KKX:[id number] DOB: Nov 13, 1990 DOA: 01/14/2022  PCP: Glean Hess, MD Patient coming from: Home  Chief Complaint: Nausea and Vomiting x 1 week, reports blood in vomit  HPI: Melanie Bell is a 31 y.o. female with medical history significant of Schizoaffective Disorder (with multiple prior psychiatric hospitalizations), Fibromyalgia and Migraines (on NSAIDs daily), history of two ex laparotomies and multiple endoscopies due to various foreign body ingestions (batteries, thumbtacks, razor blades), and GERD who presents to the ED with reported nausea and vomiting associated with bright red blood clots that has been ongoing for one week.  She uses Mobic daily and excedrin occasionally.  She denies use of alcohol, illicit substances, aspirin, or anticoagulants.  She denies any dizziness, lightheadedness, passing out, or hematochezia.  Vitals are stable with BP 122/77 mm Hg and HR 75 beats/min.  Abdominal exam is benign with no tenderness, guarding, or peritoneal signs.  Hemoglobin and Hematocrit are stable at 13.4/41.  BUN/Creatinine is stable at 12/0.6.  Of note, patient was seen in the ED for similar complaints on 12/01/21 and CT of the Abdomen wo contrast showed Colonic Diverticulosis, Cholelithiasis without evidence of cholecystitis, and no acute abnormality.  Her prior endoscopies and surgeries were in 2016, 2017, 2018  ED Course:  IV Zofran 4 mg x once IV Protonix 40 mg x once Fentanyl 50 mcg x once  Review of Systems: As per HPI; otherwise review of systems reviewed and negative.   Ambulatory Status:Ambulates without assistance   Past Medical History:  Diagnosis Date   Allergy    Anxiety    Arthritis    Asthma    Depression    Foreign body aspiration    GERD (gastroesophageal reflux disease)    Hallucinations 09/30/2014   Sizoaffective   Hyperlipidemia    Hypertension    Intentional ingestion of batteries 04/21/2016    2 AAA  batteries and 1 thumb tack; intent to hurt herself/notes 16/01/6788   Self-inflicted injury 38/02/1750   Suicidal ideation 01/02/2018   Tardive dyskinesia 10/2014   recent onset   Thyroid disease     Past Surgical History:  Procedure Laterality Date   ABDOMINAL SURGERY     "years ago" to remove foreign objects   APPENDECTOMY     BREAST EXCISIONAL BIOPSY Right 09/03/2007   surgical bx removed fibroadeoma   BREAST LUMPECTOMY Right    COLONOSCOPY WITH PROPOFOL N/A 09/10/2015   Procedure: COLONOSCOPY WITH PROPOFOL;  Surgeon: [name], MD;  Location: [facility] ENDOSCOPY;  Service: Endoscopy;  Laterality: N/A;   ESOPHAGOGASTRODUODENOSCOPY N/A 11/28/2014   Procedure: ESOPHAGOGASTRODUODENOSCOPY (EGD);  Surgeon: [name], MD;  Location: [facility] ENDOSCOPY;  Service: Endoscopy;  Laterality: N/A;   ESOPHAGOGASTRODUODENOSCOPY N/A 02/21/2016   Procedure: ESOPHAGOGASTRODUODENOSCOPY (EGD);  Surgeon: Mauri Pole, MD;  Location: [facility] ENDOSCOPY;  Service: Endoscopy;  Laterality: N/A;   ESOPHAGOGASTRODUODENOSCOPY N/A 04/21/2016   Procedure: ESOPHAGOGASTRODUODENOSCOPY (EGD);  Surgeon: Gatha Mayer, MD;  Location: [facility] ENDOSCOPY;  Service: Endoscopy;  Laterality: N/A;   ESOPHAGOGASTRODUODENOSCOPY N/A 05/06/2016   Procedure: ESOPHAGOGASTRODUODENOSCOPY (EGD);  Surgeon: Jonathon Bellows, MD;  Location: [facility] ENDOSCOPY;  Service: Gastroenterology;  Laterality: N/A;   ESOPHAGOGASTRODUODENOSCOPY N/A 06/13/2016   Procedure: ESOPHAGOGASTRODUODENOSCOPY (EGD);  Surgeon: [name], MD;  Location: [facility] ENDOSCOPY;  Service: Endoscopy;  Laterality: N/A;   ESOPHAGOGASTRODUODENOSCOPY (EGD) WITH PROPOFOL N/A 02/29/2016   Procedure: ESOPHAGOGASTRODUODENOSCOPY (EGD) WITH PROPOFOL;  Surgeon: [name], MD;  Location: [facility] ENDOSCOPY;  Service: Endoscopy;  Laterality: N/A;  ESOPHAGOGASTRODUODENOSCOPY (EGD) WITH PROPOFOL N/A 01/15/2018   Procedure: ESOPHAGOGASTRODUODENOSCOPY (EGD) WITH PROPOFOL;  Surgeon: Daneil Dolin, MD;  Location: [facility] ENDO SUITE;  Service: Endoscopy;  Laterality: N/A;  retrieval forein body   LAPAROTOMY N/A 09/12/2015   Procedure: EXPLORATORY LAPAROTOMY;  Surgeon: Florene Glen, MD;  Location: [facility] ORS;  Service: General;  Laterality: N/A;   SIGMOIDOSCOPY N/A 09/12/2015   Procedure: Lonell Face;  Surgeon: Florene Glen, MD;  Location: [facility] ORS;  Service: General;  Laterality: N/A;   WISDOM TOOTH EXTRACTION      Social History   Socioeconomic History   Marital status: Married    Spouse name: Not on file   Number of children: 0   Years of education: 12   Highest education level: Not on file  Occupational History   Occupation: unemployed  Tobacco Use   Smoking status: Former    Packs/day: 0.50    Years: 3.00    Total pack years: 1.50    Types: Cigarettes    Quit date: 08/14/2016    Years since quitting: 5.4   Smokeless tobacco: Never  Vaping Use   Vaping Use: Never used  Substance and Sexual Activity   Alcohol use: No   Drug use: No   Sexual activity: Yes    Partners: Male    Birth control/protection: None  Other Topics Concern   Not on file  Social History Narrative   Right handed    One story home   Drinks occasional caffeine   Social Determinants of Health   Financial Resource Strain: Low Risk  (01/04/2021)   Overall Financial Resource Strain (CARDIA)    Difficulty of Paying Living Expenses: Not very hard  Food Insecurity: No Food Insecurity (01/04/2021)   Hunger Vital Sign    Worried About Running Out of Food in the Last Year: Never true    Ran Out of Food in the Last Year: Never true  Transportation Needs: No Transportation Needs (01/05/2022)   PRAPARE - Hydrologist (Medical): No    Lack of Transportation (Non-Medical): No  Physical Activity: Insufficiently Active (01/05/2022)   Exercise Vital Sign    Days of Exercise per Week: 2 days    Minutes of Exercise per Session: 60 min  Stress: No Stress Concern Present  (01/05/2022)   [place]    Feeling of Stress : Not at all  Social Connections: Moderately Integrated (01/05/2022)   Social Connection and Isolation Panel [NHANES]    Frequency of Communication with Friends and Family: More than three times a week    Frequency of Social Gatherings with Friends and Family: Twice a week    Attends Religious Services: More than 4 times per year    Active Member of [employer] or Organizations: No    Attends Archivist Meetings: Never    Marital Status: Married  Human resources officer Violence: Not At Risk (01/05/2022)   Humiliation, Afraid, Rape, and Kick questionnaire    Fear of Current or Ex-Partner: No    Emotionally Abused: No    Physically Abused: No    Sexually Abused: No    Allergies  Allergen Reactions   Betadine [Povidone Iodine] Other (See Comments)    Reaction:  Unknown    Fish Allergy Hives   Iodine Rash   Shellfish-Derived Products Other (See Comments)    Reaction:  Unknown     Family History  Problem Relation Age of Onset  Depression Mother    Hypertension Mother    Sleep apnea Mother    Asthma Mother    COPD Mother    Diabetes Mother    Anxiety disorder Mother    Arthritis Mother    Breast cancer Maternal Aunt        20's   Stroke Maternal Grandmother     Prior to Admission medications   Medication Sig Start Date End Date Taking? Authorizing Provider  albuterol (VENTOLIN HFA) 108 (90 Base) MCG/ACT inhaler 2 puffs four times a day as needed 09/03/21   Glean Hess, MD  ARIPiprazole (ABILIFY) 5 MG tablet Take 5 mg by mouth daily. 12/03/21   [provider]  cabergoline (DOSTINEX) 0.5 MG tablet Take 1 tablet (0.5 mg total) by mouth 2 (two) times a week. 06/28/21   Shamleffer, Melanie Crazier, MD  escitalopram (LEXAPRO) 10 MG tablet Take 10 mg by mouth daily. 08/10/21   [provider]  fluticasone-salmeterol (ADVAIR HFA) 45-21 MCG/ACT inhaler  Inhale 2 puffs into the lungs 2 (two) times daily. 09/03/21   Glean Hess, MD  hydrOXYzine (VISTARIL) 50 MG capsule Take 50 mg by mouth 2 (two) times daily as needed. 08/20/19   [provider]  levothyroxine (SYNTHROID) 75 MCG tablet Take 1 tablet (75 mcg total) by mouth as directed. Two tablets on Sundays and Wednesday and 1 tablet rest of the week 11/18/21   Shamleffer, Melanie Crazier, MD  lithium carbonate 300 MG capsule Take 1 capsule (300 mg total) by mouth 2 (two) times daily with a meal. 12/11/17   [name], [name], MD  magnesium oxide (MAG-OX) 400 MG tablet Take 400 mg by mouth daily.    [provider]  meloxicam (MOBIC) 7.5 MG tablet Take 7.5 mg by mouth daily. 02/22/21   [provider]  omeprazole (PRILOSEC) 20 MG capsule TAKE 1 CAPSULE (20 MG TOTAL) BY MOUTH DAILY. 09/30/21   Glean Hess, MD  prazosin (MINIPRESS) 2 MG capsule Take 1 capsule (2 mg total) by mouth at bedtime. 01/12/18   McNew, Tyson Babinski, MD  pregabalin (LYRICA) 50 MG capsule Take 50 mg by mouth 2 (two) times daily. 07/12/21   [provider]  Prenatal Vit-Fe Fumarate-FA (PRENATAL MULTIVITAMIN) TABS tablet Take 1 tablet by mouth daily at 12 noon.    [provider]  SUMAtriptan Dellis Filbert) 20 MG/ACT nasal spray SMARTSIG:Both Nares 12/06/21   [provider]  topiramate (TOPAMAX) 25 MG tablet Take 25 mg by mouth 2 (two) times daily. 12/06/21   [provider]    Physical Exam: Vitals:   01/14/22 0909 01/14/22 0917 01/14/22 1000  BP:  117/74 120/77  Pulse:  67 70  Resp:  18 18  Temp:  98.2 F (36.8 C) 98.7 F (37.1 C)  TempSrc:  Oral   SpO2:  96% 98%  Weight: 131.5 kg    Height: '5\' 7"'$  (1.702 m)      Examination: General exam: obese young female, alert and oriented, no acute distress HEENT: NCAT, PERRL Respiratory system: CTAB no WRR Cardiovascular system: Did not appreciate a murmur, regular, No JVD. Gastrointestinal system: No flank pain,  Abdomen soft, NT,ND, BS+. Nervous System: No focal deficits. Extremities: No edema, distal peripheral pulses palpable.  Skin: No rashes, No bruises, No icterus. MSK: normal muscle bulk and tone   Radiological Exams on Admission: Independently reviewed - see discussion in A/P where applicable  CT ABDOMEN PELVIS WO CONTRAST CLINICAL DATA:  Acute abdominal pain.  Vomiting.  EXAM: CT ABDOMEN AND PELVIS WITHOUT CONTRAST  TECHNIQUE: Multidetector CT imaging of the abdomen and pelvis was performed following the standard protocol without IV contrast.  RADIATION DOSE REDUCTION: This exam was performed according to the departmental dose-optimization program which includes automated exposure control, adjustment of the mA and/or kV according to patient size and/or use of iterative reconstruction technique.  COMPARISON:  CT 12/16/2017  FINDINGS: Lower chest: No acute airspace disease. No pleural effusion. The heart is normal in size.  Hepatobiliary: Mild subjective hepatic steatosis. Liver is enlarged spanning 20 cm cranial caudal. No evidence of focal hepatic lesion on this unenhanced exam. Daneil Dan 2 gallstones in the gallbladder which is decompressed. No pericholecystic inflammation. No biliary dilatation.  Pancreas: No ductal dilatation or inflammation.  Spleen: Normal in size without focal abnormality.  Adrenals/Urinary Tract: Normal adrenal glands. No hydronephrosis or renal calculi. No perinephric edema. No evidence of contour deforming renal mass. Nondistended urinary bladder, not well assessed. No bladder stone.  Stomach/Bowel: Patulous distal esophagus. Unremarkable appearance of the stomach. No small bowel obstruction or inflammation. Floppy cecum currently located in the mid abdomen. The appendix is not definitively seen. Scattered colonic diverticulosis without focal diverticulitis. No colonic inflammation.  Vascular/Lymphatic: Normal caliber abdominal aorta. No  portal venous or mesenteric gas. No abdominopelvic adenopathy.  Reproductive: Uterus and bilateral adnexa are unremarkable.  Other: Postsurgical change of the anterior abdominal wall. No ascites, free air or focal fluid collection.  Musculoskeletal: There are no acute or suspicious osseous abnormalities.  IMPRESSION: 1. No acute abnormality in the abdomen/pelvis. 2. Hepatomegaly and hepatic steatosis. 3. Colonic diverticulosis without focal diverticulitis. 4. Cholelithiasis without CT findings of acute cholecystitis.  Electronically Signed   By: Keith Rake M.D.   On: 12/01/2021 21:17 DG Chest 2 View CLINICAL DATA:  Syncope.  EXAM: CHEST - 2 VIEW  COMPARISON:  01/27/2021  FINDINGS: The cardiomediastinal contours are normal. Mild peribronchial thickening. Pulmonary vasculature is normal. No consolidation, pleural effusion, or pneumothorax. No acute osseous abnormalities are seen.  IMPRESSION: Mild peribronchial thickening suggesting bronchitis or asthma.  Electronically Signed   By: Keith Rake M.D.   On: 11/30/2021 23:58    EKG: Independently reviewed.  NSR with rate 68 beats/min; no acute ST or T wave changes.  QTC is 395 ms.  Labs on Admission: I have personally reviewed the available labs and imaging studies at the time of the admission.  Pertinent labs:    11/30/21  12/01/21 01/14/22   Hemoglobin 13.7 14.4 13.4    Lab Results  Component Value Date   WBC 9.3 01/14/2022   HGB 13.4 01/14/2022   HCT 41.0 01/14/2022   MCV 90.3 01/14/2022   PLT 219 28/31/5176   Last metabolic panel Lab Results  Component Value Date   GLUCOSE 107 (H) 01/14/2022   NA 140 01/14/2022   K 4.0 01/14/2022   CL 114 (H) 01/14/2022   CO2 22 01/14/2022   BUN 12 01/14/2022   CREATININE 0.60 01/14/2022   GFRNONAA >60 01/14/2022   CALCIUM 9.6 01/14/2022   PROT 7.5 01/14/2022   ALBUMIN 3.9 01/14/2022   BILITOT 0.8 01/14/2022   ALKPHOS 80 01/14/2022   AST 17  01/14/2022   ALT 12 01/14/2022   ANIONGAP 4 (L) 01/14/2022    Assessment/Plan Acute Hematemesis: Vitals are stable.  HH is 13/41.  BUN/Cr is 12/0.6.  Reticulocyte count is not elevated. - Gastroenterology Dr. Alice Reichert will perform endoscopy this afternoon, appreciate assistance.  Keep NPO. - Discontinue home Mobic and other  NSAIDs.  Nausea and Vomiting: 12/01/21 CT of the Abdomen showed cholelithiasis. - Check RUQ Ultrasound to evaluate gallbladder. - Give IV Zofran PRN.  Qtc is 395 ms.  GERD: - Start IV Protonix 40 mg BID.  Schizoaffective Disorder: - I confirmed patient's home psychiatric medications - Abilify 5 mg daily, Lithium 300 mg BID, Lexapro 5 mg daily, Hydroxyzine 50 mg TID PRN.  History of Hyperprolactinemia: This may have been related to psychiatric medications Risperidone or Paliperidone.  2021 MRI of brain showed normal pituitary. - Continue home Cabergoline 0.5 mg twice weekly. - Patient follows with Endocrine Dr. Halina Maidens.  Hypothyroidism: TSH is 0.77 uIU/mL. - Continue home Levothyroxine 75 mcg daily and follow up with Endocrine outpatient.  History of Migraines: - Continue Topamax, and Sumatriptan 20 mg as needed.  Patient follows with Neurology Dr. Jennings Books.  Fibromyalgia: - Pregabalin 50 mg BID.  Sleep Disturbances and Nightmares: - Prazosin 2 mg nightly.  Asthma, not in acute exacerbation: - Advair BID and Albuterol PRN.  Hepatic Steatosis: This is fatty liver disease. - Counseled on weight loss.  Morbid Obesity: Body mass index is 45.42 kg/m. - Counseled on weight loss through diet and lifestyle modifications.  Note: This patient has been tested and is negative for the novel coronavirus COVID-19. The patient has been fully vaccinated against COVID-19.   Level of care:  DVT prophylaxis: SCDs given GI bleed. Code Status: Full - confirmed with patient/family Family Communication: None present; Disposition Plan:  The patient is from:  home  Anticipated d/c is to: home without [facility] services once her GI bleed workup is complete.  Anticipated d/c date: If endoscopy shows no acute abnormality and patient's nausea and vomiting are resolved, she can be discharged as early as this evening.  If she still feels nauseous and is unable to take anything by mouth, she can be discharged first thing tomorrow.  Patient is currently: acutely ill Consults called: Gastroenterology Admission status:  Observation   George Hugh MD [facility]   How to contact the [facility] Attending or Consulting provider [place] or covering provider during after hours [place], for this patient?  Check the care team in [facility] and look for a) attending/consulting [facility] provider listed and b) the [facility] team listed Log into [email or web address] and use [place]'s universal password to access. If you do not have the password, please contact the hospital operator. Locate the [facility] provider you are looking for under [facility] and page to a number that you can be directly reached. If you still have difficulty reaching the provider, please page the [facility] (Director on Call) for the Hospitalists listed on amion for assistance.   01/14/2022, 12:24 PM

## 2022-01-14 NOTE — ED Triage Notes (Addendum)
First nurse note: Arrived by EMS from home with c/o emesis X1 week. Today has stomach pains and reports blood like substance.   Patient A&O x4 in lobby. Texting on phone while waiting   EMS vitals: 122/64 b/p 97% RA 66HR 18RR

## 2022-01-14 NOTE — ED Provider Notes (Signed)
[facility] Provider Note    Event Date/Time   First MD Initiated Contact with Patient 01/14/22 [phone]     (approximate)   History   Abdominal Pain and Emesis   HPI  Melanie Bell is a 31 y.o. female  schizoaffective disorder, GERD, HTN, HDL, depression, anxiety who comes in with abdominal pain and emesis.  Patient reports emesis x1 week.  Patient reports multiple episodes of vomiting over the past few days with some upper abdominal pain.  She reports being told that she has a history of gallstones so she was not sure if it was related to that.  Then today she developed 3 episodes of bright red blood in the vomit associated with the upper abdominal pain.  She denies any shortness of breath or chest pain.  The pain is more in the upper abdomen.  Denies any lower abdominal pain.  Denies this ever happening previously.  She does report taking meloxicam daily denies any alcohol use or history of liver issues in the past.  Patient does report that she no longer has a legal guardian that she now makes her own medical decisions since May of last year.   Physical Exam   Triage Vital Signs: ED Triage Vitals  Enc Vitals Group     BP 01/14/22 0917 117/74     Pulse Rate 01/14/22 0917 67     Resp 01/14/22 0917 18     Temp 01/14/22 0917 98.2 F (36.8 C)     Temp Source 01/14/22 0917 Oral     SpO2 01/14/22 0917 96 %     Weight 01/14/22 0909 290 lb (131.5 kg)     Height 01/14/22 0909 '5\' 7"'$  (1.702 m)     Head Circumference --      Peak Flow --      Pain Score 01/14/22 0907 7     Pain Loc --      Pain Edu? --      Excl. in [place]? --     Most recent vital signs: Vitals:   01/14/22 0917  BP: 117/74  Pulse: 67  Resp: 18  Temp: 98.2 F (36.8 C)  SpO2: 96%     General: Awake, no distress.  CV:  Good peripheral perfusion.  Resp:  Normal effort.  Abd:  No distention.  Mild tenderness noted in the upper abdomen without any rebound or guarding.  No lower  abdominal tenderness. Other:     ED Results / Procedures / Treatments   Labs (all labs ordered are listed, but only abnormal results are displayed) Labs Reviewed  CBC WITH DIFFERENTIAL/PLATELET  COMPREHENSIVE METABOLIC PANEL  LIPASE, BLOOD  HCG, QUANTITATIVE, PREGNANCY  URINALYSIS, ROUTINE W REFLEX MICROSCOPIC  PROTIME-INR  LITHIUM LEVEL  POC URINE PREG, ED  TYPE AND SCREEN  TROPONIN I (HIGH SENSITIVITY)     EKG  My interpretation of EKG:  Normal sinus rate of 68 without any ST elevation, T wave version lead III, normal intervals  RADIOLOGY I have reviewed the xray personally and  interpreted no free air noted   US IMPRESSION: Cholelithiasis without evidence to suggest acute cholecystitis or biliary obstruction by ultrasound.   PROCEDURES:  Critical Care performed: No  .1-3 Lead EKG Interpretation  Performed by: Vanessa Racine, MD Authorized by: Vanessa [place], MD     Interpretation: normal     ECG rate:  70   ECG rate assessment: normal     Rhythm: sinus rhythm  Ectopy: none     Conduction: normal      MEDICATIONS ORDERED IN ED: Medications  pantoprazole (PROTONIX) injection 40 mg (40 mg Intravenous Given 01/14/22 1040)  fentaNYL (SUBLIMAZE) injection 50 mcg (50 mcg Intravenous Given 01/14/22 1041)  ondansetron (ZOFRAN) injection 4 mg (4 mg Intravenous Given 01/14/22 1041)     IMPRESSION / MDM / ASSESSMENT AND PLAN / ED COURSE  I reviewed the triage vital signs and the nursing notes.   Patient's presentation is most consistent with acute presentation with potential threat to life or bodily function.   Differential includes viral illness, COVID, gallstones, ulcers, Mallory-Weiss tears.  Will get type and screen, labs to evaluate for anemia and get ultrasound to evaluate for gallbladder pathology and x-ray to evaluate for any evidence of free air.  My suspicion for perforation is lower given not significantly tender without any rebound or guarding on  examination.  INR is normal.  CBC shows stable hemoglobin although the bleeding just started.  CMP shows normal creatinine.  Lipase is normal troponin negative hCG negative urine without evidence of UTI.  Repeat abdominal exam is soft and nontender again I am low suspicion for perforation at this time.  She has no lower abdominal tenderness to suggest needing CT imaging  Her ultrasound and x-ray were reassuring I did discuss the case with Dr. Alice Reichert from GI and they will plan to do endoscopy hopefully later today.  Patient was given a PPI given she is on meloxicam concern for possible gastritis versus ulcers versus Mallory-Weiss tear.  We will discuss hsopital team for admission.  Currently hemodynamically stable  Patient does report history of psychiatric illness with swallowing things in the past but denies swallowing anything recently and reports that from a psychiatric standpoint that she has been stable    The patient is on the cardiac monitor to evaluate for evidence of arrhythmia and/or significant heart rate changes.      FINAL CLINICAL IMPRESSION(S) / ED DIAGNOSES   Final diagnoses:  Upper GI bleed     Rx / DC Orders   ED Discharge Orders     None        Note:  This document was prepared using Dragon voice recognition software and may include unintentional dictation errors.   Vanessa [place], MD 01/14/22 1227

## 2022-01-14 NOTE — Consult Note (Signed)
[facility] GI Inpatient Consult Note   Melanie Bell, M.D.  Reason for Consult: hemetemesis   Attending Requesting Consult: Melanie Crochet. [place], Melanie Bell.  Outpatient Primary Physician: Melanie Maidens, Melanie Bell.  History of Present Illness: Melanie Bell is a 31 y.o. female with a personal history of schizoaffective disorder, obesity, anxiety who presents to the emergency room this morning with recurrent nausea and vomiting.  Patient says that her last few episodes of vomiting at home had "blood clots in them".  Patient complains of some mild epigastric discomfort.  She denies any melena.  She has been n.p.o. since yesterday evening. Patient has a history of recurrent emergency endoscopies for ingestion of foreign bodies secondary to untreated schizoaffective disorder.  She has had batteries and safety pens and other items removed from the esophagus and the stomach.  She appears to be stable from a psychiatric standpoint and is under the care of a psychiatrist/therapist. She denies any suicidal or homicidal ideation and maintains that she no longer engages in behavior of ingesting foreign bodies or doing any other high risk behaviors.  Past Medical History:  Past Medical History:  Diagnosis Date   Allergy    Anxiety    Arthritis    Asthma    Depression    Foreign body aspiration    GERD (gastroesophageal reflux disease)    Hallucinations 09/30/2014   Sizoaffective   Hyperlipidemia    Hypertension    Intentional ingestion of batteries 04/21/2016    2 AAA batteries and 1 thumb tack; intent to hurt herself/notes 74/0/8144   Self-inflicted injury 81/85/6314   Suicidal ideation 01/02/2018   Tardive dyskinesia 10/2014   recent onset   Thyroid disease     Problem List: Patient Active Problem List   Diagnosis Date Noted   Encounter for supervision of other normal pregnancy, first trimester 11/12/2021   Morbid obesity ([place]) 09/30/2021   Acquired hypothyroidism 06/25/2021    Migraine without aura and without status migrainosus, not intractable 03/05/2021   Mild intermittent asthma without complication 97/06/6376   Gastroesophageal reflux disease 12/01/2020   Hypoglycemia 12/01/2020   Rheumatoid factor positive 12/09/2019   SS-A antibody positive 10/28/2019   Chronic pain of both knees 10/28/2019   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   Schizoaffective disorder, depressive type ([place]) 02/02/2018   Suicide attempt ([place]) 01/16/2018   Dysfunctional uterine bleeding 01/06/2018   Borderline personality disorder ([place]) 01/04/2018   PTSD (post-traumatic stress disorder) 01/03/2018   ASCUS of cervix with negative high risk HPV 08/28/2017   Malingering 05/06/2016    Past Surgical History: Past Surgical History:  Procedure Laterality Date   ABDOMINAL SURGERY     "years ago" to remove foreign objects   APPENDECTOMY     BREAST EXCISIONAL BIOPSY Right 09/03/2007   surgical bx removed fibroadeoma   BREAST LUMPECTOMY Right    COLONOSCOPY WITH PROPOFOL N/A 09/10/2015   Procedure: COLONOSCOPY WITH PROPOFOL;  Surgeon: [name], Melanie Bell;  Location: [facility] ENDOSCOPY;  Service: Endoscopy;  Laterality: N/A;   ESOPHAGOGASTRODUODENOSCOPY N/A 11/28/2014   Procedure: ESOPHAGOGASTRODUODENOSCOPY (EGD);  Surgeon: [name], Melanie Bell;  Location: [facility] ENDOSCOPY;  Service: Endoscopy;  Laterality: N/A;   ESOPHAGOGASTRODUODENOSCOPY N/A 02/21/2016   Procedure: ESOPHAGOGASTRODUODENOSCOPY (EGD);  Surgeon: Mauri Pole, Melanie Bell;  Location: [facility] ENDOSCOPY;  Service: Endoscopy;  Laterality: N/A;   ESOPHAGOGASTRODUODENOSCOPY N/A 04/21/2016   Procedure: ESOPHAGOGASTRODUODENOSCOPY (EGD);  Surgeon: Gatha Mayer, Melanie Bell;  Location: [facility] ENDOSCOPY;  Service: Endoscopy;  Laterality: N/A;   ESOPHAGOGASTRODUODENOSCOPY N/A 05/06/2016  Procedure: ESOPHAGOGASTRODUODENOSCOPY (EGD);  Surgeon: Jonathon Bellows, Melanie Bell;  Location: [facility] ENDOSCOPY;  Service: Gastroenterology;  Laterality: N/A;   ESOPHAGOGASTRODUODENOSCOPY  N/A 06/13/2016   Procedure: ESOPHAGOGASTRODUODENOSCOPY (EGD);  Surgeon: [name], Melanie Bell;  Location: [facility] ENDOSCOPY;  Service: Endoscopy;  Laterality: N/A;   ESOPHAGOGASTRODUODENOSCOPY (EGD) WITH PROPOFOL N/A 02/29/2016   Procedure: ESOPHAGOGASTRODUODENOSCOPY (EGD) WITH PROPOFOL;  Surgeon: [name], Melanie Bell;  Location: [facility] ENDOSCOPY;  Service: Endoscopy;  Laterality: N/A;   ESOPHAGOGASTRODUODENOSCOPY (EGD) WITH PROPOFOL N/A 01/15/2018   Procedure: ESOPHAGOGASTRODUODENOSCOPY (EGD) WITH PROPOFOL;  Surgeon: Daneil Dolin, Melanie Bell;  Location: [facility] ENDO SUITE;  Service: Endoscopy;  Laterality: N/A;  retrieval forein body   LAPAROTOMY N/A 09/12/2015   Procedure: EXPLORATORY LAPAROTOMY;  Surgeon: Florene Glen, Melanie Bell;  Location: [facility] ORS;  Service: General;  Laterality: N/A;   SIGMOIDOSCOPY N/A 09/12/2015   Procedure: Lonell Face;  Surgeon: Florene Glen, Melanie Bell;  Location: [facility] ORS;  Service: General;  Laterality: N/A;   WISDOM TOOTH EXTRACTION      Allergies: Allergies  Allergen Reactions   Betadine [Povidone Iodine] Other (See Comments)    Reaction:  Unknown    Fish Allergy Hives   Iodine Rash   Shellfish-Derived Products Other (See Comments)    Reaction:  Unknown     Home Medications: (Not in a hospital admission)  Home medication reconciliation was completed with the patient.   Scheduled Inpatient Medications:    Continuous Inpatient Infusions:    PRN Inpatient Medications:    Family History: family history includes Anxiety disorder in her mother; Arthritis in her mother; Asthma in her mother; Breast cancer in her maternal aunt; COPD in her mother; Depression in her mother; Diabetes in her mother; Hypertension in her mother; Sleep apnea in her mother; Stroke in her maternal grandmother.   GI Family History: Negative.  Social History:   reports that she quit smoking about 5 years ago. Her smoking use included cigarettes. She has a 1.50 pack-year smoking history. She has never used  smokeless tobacco. She reports that she does not drink alcohol and does not use drugs. The patient denies ETOH, tobacco, or drug use.    Review of Systems: Review of Systems - Negative except HPI  Physical Examination: BP 120/77 (BP Location: Left Arm)   Pulse 70   Temp 98.7 F (37.1 C)   Resp 18   Ht '5\' 7"'$  (1.702 m)   Wt 131.5 kg   LMP 12/18/2021 (Exact Date)   SpO2 98%   BMI 45.42 kg/m  Physical Exam Constitutional:      General: She is not in acute distress.    Appearance: She is well-developed. She is obese. She is not ill-appearing, toxic-appearing or diaphoretic.  HENT:     Head: Normocephalic and atraumatic.     Mouth/Throat:     Mouth: Mucous membranes are moist.     Pharynx: Oropharynx is clear.  Eyes:     Extraocular Movements: Extraocular movements intact.     Pupils: Pupils are equal, round, and reactive to light.  Cardiovascular:     Rate and Rhythm: Normal rate.     Heart sounds: No murmur heard.    No gallop.  Pulmonary:     Effort: Pulmonary effort is normal.     Breath sounds: Normal breath sounds. No stridor. No wheezing or rhonchi.  Abdominal:     General: Bowel sounds are normal. There is no abdominal bruit. There are no signs of injury.     Palpations: Abdomen is soft.  Tenderness: There is no abdominal tenderness.     Hernia: No hernia is present.  Skin:    General: Skin is warm and dry.     Capillary Refill: Capillary refill takes less than 2 seconds.  Neurological:     General: No focal deficit present.     Mental Status: She is alert.  Psychiatric:        Mood and Affect: Mood normal.        Behavior: Behavior normal.     Data: Lab Results  Component Value Date   WBC 9.3 01/14/2022   HGB 13.4 01/14/2022   HCT 41.0 01/14/2022   MCV 90.3 01/14/2022   PLT 219 01/14/2022   Recent Labs  Lab 01/14/22 1001  HGB 13.4   Lab Results  Component Value Date   NA 140 01/14/2022   K 4.0 01/14/2022   CL 114 (H) 01/14/2022   CO2 22  01/14/2022   BUN 12 01/14/2022   CREATININE 0.60 01/14/2022   Lab Results  Component Value Date   ALT 12 01/14/2022   AST 17 01/14/2022   ALKPHOS 80 01/14/2022   BILITOT 0.8 01/14/2022   Recent Labs  Lab 01/14/22 1001  INR 1.0      Latest Ref Rng & Units 01/14/2022   10:01 AM 12/01/2021    6:07 PM 11/30/2021   11:34 PM  CBC  WBC 4.0 - 10.5 K/uL 9.3  11.4  10.0   Hemoglobin 12.0 - 15.0 g/dL 13.4  14.4  13.7   Hematocrit 36.0 - 46.0 % 41.0  44.2  41.8   Platelets 150 - 400 K/uL 219  266  253     STUDIES: No results found. '@IMAGES'$ @  Assessment:  UGI bleeding - recent. No recurrence of bleeding while in the ED. Differential diagnosis includes Mallory-Weiss tear, peptic ulcer disease, severe erosive esophagitis, pharyngeal soft tissue trauma. CBC stable and normal at Hgb 13.4.  2.     History of schizoaffective disorder-effectively asymptomatic while on  therapy. 3.     Obesity. 4.     History of anxiety.     Recommendations:  EGD. The patient understands the nature of the planned procedure, indications, risks, alternatives and potential complications including but not limited to bleeding, infection, perforation, damage to internal organs and possible oversedation/side effects from anesthesia. The patient agrees and gives consent to proceed.  Please refer to procedure notes for findings, recommendations and patient disposition/instructions.   Thank you for the consult. Please call with questions or concerns.  Olean Ree, "Lanny Hurst Melanie Bell [facility] [place],  [id number] [phone]  01/14/2022 11:39 AM

## 2022-01-14 NOTE — Anesthesia Preprocedure Evaluation (Signed)
Anesthesia Evaluation  Patient identified by MRN, date of birth, ID band Patient awake    Reviewed: Allergy & Precautions, NPO status , Patient's Chart, lab work & pertinent test results  Airway Mallampati: II  TM Distance: >3 FB Neck ROM: Full    Dental  (+) Teeth Intact   Pulmonary neg pulmonary ROS, asthma , Patient abstained from smoking., former smoker,    Pulmonary exam normal  + decreased breath sounds      Cardiovascular Exercise Tolerance: Good hypertension, Pt. on medications negative cardio ROS Normal cardiovascular exam Rhythm:Regular     Neuro/Psych Anxiety Depression Schizophrenia negative neurological ROS  negative psych ROS   GI/Hepatic negative GI ROS, Neg liver ROS, GERD  Medicated,  Endo/Other  negative endocrine ROSHypothyroidism Morbid obesity  Renal/GU negative Renal ROS  negative genitourinary   Musculoskeletal  (+) Arthritis ,   Abdominal (+) + obese,   Peds negative pediatric ROS (+)  Hematology negative hematology ROS (+)   Anesthesia Other Findings Past Medical History: No date: Allergy No date: Anxiety No date: Arthritis No date: Asthma No date: Depression No date: Foreign body aspiration No date: GERD (gastroesophageal reflux disease) 09/30/2014: Hallucinations     Comment:  Sizoaffective No date: Hyperlipidemia No date: Hypertension 04/21/2016: Intentional ingestion of batteries     Comment:   2 AAA batteries and 1 thumb tack; intent to hurt               herself/notes 04/21/2016 28/41/3244: Self-inflicted injury 05/18/7251: Suicidal ideation 10/2014: Tardive dyskinesia     Comment:  recent onset No date: Thyroid disease  Past Surgical History: No date: ABDOMINAL SURGERY     Comment:  "years ago" to remove foreign objects No date: APPENDECTOMY 09/03/2007: BREAST EXCISIONAL BIOPSY; Right     Comment:  surgical bx removed fibroadeoma No date: BREAST LUMPECTOMY;  Right 09/10/2015: COLONOSCOPY WITH PROPOFOL; N/A     Comment:  Procedure: COLONOSCOPY WITH PROPOFOL;  Surgeon: [name], MD;  Location: [facility] ENDOSCOPY;  Service:               Endoscopy;  Laterality: N/A; 11/28/2014: ESOPHAGOGASTRODUODENOSCOPY; N/A     Comment:  Procedure: ESOPHAGOGASTRODUODENOSCOPY (EGD);  Surgeon:               [name], MD;  Location: [facility] ENDOSCOPY;                Service: Endoscopy;  Laterality: N/A; 02/21/2016: ESOPHAGOGASTRODUODENOSCOPY; N/A     Comment:  Procedure: ESOPHAGOGASTRODUODENOSCOPY (EGD);  Surgeon:               Mauri Pole, MD;  Location: [facility] ENDOSCOPY;                Service: Endoscopy;  Laterality: N/A; 04/21/2016: ESOPHAGOGASTRODUODENOSCOPY; N/A     Comment:  Procedure: ESOPHAGOGASTRODUODENOSCOPY (EGD);  Surgeon:               Gatha Mayer, MD;  Location: [facility] ENDOSCOPY;  Service:               Endoscopy;  Laterality: N/A; 05/06/2016: ESOPHAGOGASTRODUODENOSCOPY; N/A     Comment:  Procedure: ESOPHAGOGASTRODUODENOSCOPY (EGD);  Surgeon:               Jonathon Bellows, MD;  Location: [facility] ENDOSCOPY;  Service:               Gastroenterology;  Laterality: N/A; 06/13/2016:  ESOPHAGOGASTRODUODENOSCOPY; N/A     Comment:  Procedure: ESOPHAGOGASTRODUODENOSCOPY (EGD);  Surgeon:               [name], MD;  Location: [facility] ENDOSCOPY;  Service:               Endoscopy;  Laterality: N/A; 02/29/2016: ESOPHAGOGASTRODUODENOSCOPY (EGD) WITH PROPOFOL; N/A     Comment:  Procedure: ESOPHAGOGASTRODUODENOSCOPY (EGD) WITH               PROPOFOL;  Surgeon: [name], MD;  Location: [facility]               ENDOSCOPY;  Service: Endoscopy;  Laterality: N/A; 01/15/2018: ESOPHAGOGASTRODUODENOSCOPY (EGD) WITH PROPOFOL; N/A     Comment:  Procedure: ESOPHAGOGASTRODUODENOSCOPY (EGD) WITH               PROPOFOL;  Surgeon: Daneil Dolin, MD;  Location: [facility]               ENDO SUITE;  Service: Endoscopy;  Laterality: N/A;                retrieval forein  body 09/12/2015: LAPAROTOMY; N/A     Comment:  Procedure: EXPLORATORY LAPAROTOMY;  Surgeon: Florene Glen, MD;  Location: [facility] ORS;  Service: General;                Laterality: N/A; 09/12/2015: Lonell Face; N/A     Comment:  Procedure: SIGMOIDOSCOPY;  Surgeon: Florene Glen,               MD;  Location: [facility] ORS;  Service: General;  Laterality:               N/A; No date: WISDOM TOOTH EXTRACTION  BMI    Body Mass Index: 45.42 kg/m      Reproductive/Obstetrics negative OB ROS                             Anesthesia Physical Anesthesia Plan  ASA: 2 and emergent  Anesthesia Plan: General   Post-op Pain Management:    Induction: Intravenous  PONV Risk Score and Plan: Propofol infusion and TIVA  Airway Management Planned: Natural Airway  Additional Equipment:   Intra-op Plan:   Post-operative Plan:   Informed Consent: I have reviewed the patients History and Physical, chart, labs and discussed the procedure including the risks, benefits and alternatives for the proposed anesthesia with the patient or authorized representative who has indicated his/her understanding and acceptance.     Dental Advisory Given  Plan Discussed with: CRNA and Surgeon  Anesthesia Plan Comments:         Anesthesia Quick Evaluation

## 2022-01-14 NOTE — Anesthesia Procedure Notes (Signed)
Date/Time: 01/14/2022 2:05 PM  Performed by: Nelda Marseille, CRNAPre-anesthesia Checklist: Patient identified, Emergency Drugs available, Suction available, Patient being monitored and Timeout performed Oxygen Delivery Method: Nasal cannula

## 2022-01-14 NOTE — H&P (View-Only) (Signed)
[facility] GI Inpatient Consult Note   Melanie Bell, M.D.  Reason for Consult: hemetemesis   Attending Requesting Consult: Royetta Crochet. Funke, DO.  Outpatient Primary Physician: Halina Maidens, MD.  History of Present Illness: Melanie Bell is a 31 y.o. female with a personal history of schizoaffective disorder, obesity, anxiety who presents to the emergency room this morning with recurrent nausea and vomiting.  Patient says that her last few episodes of vomiting at home had "blood clots in them".  Patient complains of some mild epigastric discomfort.  She denies any melena.  She has been n.p.o. since yesterday evening. Patient has a history of recurrent emergency endoscopies for ingestion of foreign bodies secondary to untreated schizoaffective disorder.  She has had batteries and safety pens and other items removed from the esophagus and the stomach.  She appears to be stable from a psychiatric standpoint and is under the care of a psychiatrist/therapist. She denies any suicidal or homicidal ideation and maintains that she no longer engages in behavior of ingesting foreign bodies or doing any other high risk behaviors.  Past Medical History:  Past Medical History:  Diagnosis Date   Allergy    Anxiety    Arthritis    Asthma    Depression    Foreign body aspiration    GERD (gastroesophageal reflux disease)    Hallucinations 09/30/2014   Sizoaffective   Hyperlipidemia    Hypertension    Intentional ingestion of batteries 04/21/2016    2 AAA batteries and 1 thumb tack; intent to hurt herself/notes 17/09/1023   Self-inflicted injury 85/27/7824   Suicidal ideation 01/02/2018   Tardive dyskinesia 10/2014   recent onset   Thyroid disease     Problem List: Patient Active Problem List   Diagnosis Date Noted   Encounter for supervision of other normal pregnancy, first trimester 11/12/2021   Morbid obesity ([place]) 09/30/2021   Acquired hypothyroidism 06/25/2021    Migraine without aura and without status migrainosus, not intractable 03/05/2021   Mild intermittent asthma without complication 23/53/6144   Gastroesophageal reflux disease 12/01/2020   Hypoglycemia 12/01/2020   Rheumatoid factor positive 12/09/2019   SS-A antibody positive 10/28/2019   Chronic pain of both knees 10/28/2019   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   Schizoaffective disorder, depressive type ([place]) 02/02/2018   Suicide attempt ([place]) 01/16/2018   Dysfunctional uterine bleeding 01/06/2018   Borderline personality disorder ([place]) 01/04/2018   PTSD (post-traumatic stress disorder) 01/03/2018   ASCUS of cervix with negative high risk HPV 08/28/2017   Malingering 05/06/2016    Past Surgical History: Past Surgical History:  Procedure Laterality Date   ABDOMINAL SURGERY     "years ago" to remove foreign objects   APPENDECTOMY     BREAST EXCISIONAL BIOPSY Right 09/03/2007   surgical bx removed fibroadeoma   BREAST LUMPECTOMY Right    COLONOSCOPY WITH PROPOFOL N/A 09/10/2015   Procedure: COLONOSCOPY WITH PROPOFOL;  Surgeon: [name], MD;  Location: [facility] ENDOSCOPY;  Service: Endoscopy;  Laterality: N/A;   ESOPHAGOGASTRODUODENOSCOPY N/A 11/28/2014   Procedure: ESOPHAGOGASTRODUODENOSCOPY (EGD);  Surgeon: [name], MD;  Location: [facility] ENDOSCOPY;  Service: Endoscopy;  Laterality: N/A;   ESOPHAGOGASTRODUODENOSCOPY N/A 02/21/2016   Procedure: ESOPHAGOGASTRODUODENOSCOPY (EGD);  Surgeon: Mauri Pole, MD;  Location: [facility] ENDOSCOPY;  Service: Endoscopy;  Laterality: N/A;   ESOPHAGOGASTRODUODENOSCOPY N/A 04/21/2016   Procedure: ESOPHAGOGASTRODUODENOSCOPY (EGD);  Surgeon: Gatha Mayer, MD;  Location: [facility] ENDOSCOPY;  Service: Endoscopy;  Laterality: N/A;   ESOPHAGOGASTRODUODENOSCOPY N/A 05/06/2016  Procedure: ESOPHAGOGASTRODUODENOSCOPY (EGD);  Surgeon: Jonathon Bellows, MD;  [facility]COPY;  Service: Gastroenterology;  Laterality: N/A;   ESOPHAGOGASTRODUODENOSCOPY  N/A 06/13/2016   Procedure: ESOPHAGOGASTRODUODENOSCOPY (EGD);  Surgeon: [name], MD;  Location: [facility] ENDOSCOPY;  Service: Endoscopy;  Laterality: N/A;   ESOPHAGOGASTRODUODENOSCOPY (EGD) WITH PROPOFOL N/A 02/29/2016   Procedure: ESOPHAGOGASTRODUODENOSCOPY (EGD) WITH PROPOFOL;  Surgeon: [name], MD;  Location: [facility] ENDOSCOPY;  Service: Endoscopy;  Laterality: N/A;   ESOPHAGOGASTRODUODENOSCOPY (EGD) WITH PROPOFOL N/A 01/15/2018   Procedure: ESOPHAGOGASTRODUODENOSCOPY (EGD) WITH PROPOFOL;  Surgeon: Daneil Dolin, MD;  Location: [facility] ENDO SUITE;  Service: Endoscopy;  Laterality: N/A;  retrieval forein body   LAPAROTOMY N/A 09/12/2015   Procedure: EXPLORATORY LAPAROTOMY;  Surgeon: Florene Glen, MD;  Location: [facility] ORS;  Service: General;  Laterality: N/A;   SIGMOIDOSCOPY N/A 09/12/2015   Procedure: Lonell Face;  Surgeon: Florene Glen, MD;  Location: [facility] ORS;  Service: General;  Laterality: N/A;   WISDOM TOOTH EXTRACTION      Allergies: Allergies  Allergen Reactions   Betadine [Povidone Iodine] Other (See Comments)    Reaction:  Unknown    Fish Allergy Hives   Iodine Rash   Shellfish-Derived Products Other (See Comments)    Reaction:  Unknown     Home Medications: (Not in a hospital admission)  Home medication reconciliation was completed with the patient.   Scheduled Inpatient Medications:    Continuous Inpatient Infusions:    PRN Inpatient Medications:    Family History: family history includes Anxiety disorder in her mother; Arthritis in her mother; Asthma in her mother; Breast cancer in her maternal aunt; COPD in her mother; Depression in her mother; Diabetes in her mother; Hypertension in her mother; Sleep apnea in her mother; Stroke in her maternal grandmother.   GI Family History: Negative.  Social History:   reports that she quit smoking about 5 years ago. Her smoking use included cigarettes. She has a 1.50 pack-year smoking history. She has never used  smokeless tobacco. She reports that she does not drink alcohol and does not use drugs. The patient denies ETOH, tobacco, or drug use.    Review of Systems: Review of Systems - Negative except HPI  Physical Examination: BP 120/77 (BP Location: Left Arm)   Pulse 70   Temp 98.7 F (37.1 C)   Resp 18   Ht '5\' 7"'$  (1.702 m)   Wt 131.5 kg   LMP 12/18/2021 (Exact Date)   SpO2 98%   BMI 45.42 kg/m  Physical Exam Constitutional:      General: She is not in acute distress.    Appearance: She is well-developed. She is obese. She is not ill-appearing, toxic-appearing or diaphoretic.  HENT:     Head: Normocephalic and atraumatic.     Mouth/Throat:     Mouth: Mucous membranes are moist.     Pharynx: Oropharynx is clear.  Eyes:     Extraocular Movements: Extraocular movements intact.     Pupils: Pupils are equal, round, and reactive to light.  Cardiovascular:     Rate and Rhythm: Normal rate.     Heart sounds: No murmur heard.    No gallop.  Pulmonary:     Effort: Pulmonary effort is normal.     Breath sounds: Normal breath sounds. No stridor. No wheezing or rhonchi.  Abdominal:     General: Bowel sounds are normal. There is no abdominal bruit. There are no signs of injury.     Palpations: Abdomen is soft.  Tenderness: There is no abdominal tenderness.     Hernia: No hernia is present.  Skin:    General: Skin is warm and dry.     Capillary Refill: Capillary refill takes less than 2 seconds.  Neurological:     General: No focal deficit present.     Mental Status: She is alert.  Psychiatric:        Mood and Affect: Mood normal.        Behavior: Behavior normal.     Data: Lab Results  Component Value Date   WBC 9.3 01/14/2022   HGB 13.4 01/14/2022   HCT 41.0 01/14/2022   MCV 90.3 01/14/2022   PLT 219 01/14/2022   Recent Labs  Lab 01/14/22 1001  HGB 13.4   Lab Results  Component Value Date   NA 140 01/14/2022   K 4.0 01/14/2022   CL 114 (H) 01/14/2022   CO2 22  01/14/2022   BUN 12 01/14/2022   CREATININE 0.60 01/14/2022   Lab Results  Component Value Date   ALT 12 01/14/2022   AST 17 01/14/2022   ALKPHOS 80 01/14/2022   BILITOT 0.8 01/14/2022   Recent Labs  Lab 01/14/22 1001  INR 1.0      Latest Ref Rng & Units 01/14/2022   10:01 AM 12/01/2021    6:07 PM 11/30/2021   11:34 PM  CBC  WBC 4.0 - 10.5 K/uL 9.3  11.4  10.0   Hemoglobin 12.0 - 15.0 g/dL 13.4  14.4  13.7   Hematocrit 36.0 - 46.0 % 41.0  44.2  41.8   Platelets 150 - 400 K/uL 219  266  253     STUDIES: No results found. '@IMAGES'$ @  Assessment:  UGI bleeding - recent. No recurrence of bleeding while in the ED. Differential diagnosis includes Mallory-Weiss tear, peptic ulcer disease, severe erosive esophagitis, pharyngeal soft tissue trauma. CBC stable and normal at Hgb 13.4.  2.     History of schizoaffective disorder-effectively asymptomatic while on  therapy. 3.     Obesity. 4.     History of anxiety.     Recommendations:  EGD. The patient understands the nature of the planned procedure, indications, risks, alternatives and potential complications including but not limited to bleeding, infection, perforation, damage to internal organs and possible oversedation/side effects from anesthesia. The patient agrees and gives consent to proceed.  Please refer to procedure notes for findings, recommendations and patient disposition/instructions.   Thank you for the consult. Please call with questions or concerns.  Olean Ree, "Lanny Hurst MD [facility] [place], [place] [address] [phone]  01/14/2022 11:39 AM

## 2022-01-14 NOTE — Transfer of Care (Signed)
Immediate Anesthesia Transfer of Care Note  Patient: Melanie Bell  Procedure(s) Performed: ESOPHAGOGASTRODUODENOSCOPY (EGD)  Patient Location: PACU  Anesthesia Type:General  Level of Consciousness: awake, alert  and oriented  Airway & Oxygen Therapy: Patient Spontanous Breathing and Patient connected to nasal cannula oxygen  Post-op Assessment: Report given to RN and Post -op Vital signs reviewed and stable  Post vital signs: Reviewed and stable  Last Vitals:  Vitals Value Taken Time  BP 125/69 01/14/22 1409  Temp    Pulse 54 01/14/22 1411  Resp 10 01/14/22 1411  SpO2 98 % 01/14/22 1411  Vitals shown include unvalidated device data.  Last Pain:  Vitals:   01/14/22 1344  TempSrc: Temporal  PainSc: 0-No pain         Complications: No notable events documented.

## 2022-01-14 NOTE — Op Note (Signed)
[facility] Gastroenterology Patient [name] Procedure Date: 01/14/2022 1:52 PM MRN: [id number] Account #: [id number] Date of Birth: 07-Dec-1990 Admit Type: Inpatient Age: 31 Room: [facility] ENDO ROOM 3 Gender: Female Note Status: Finalized Instrument Name: Upper Endoscope [id number] Procedure:             Upper GI endoscopy Indications:           Hematemesis Providers:             Benay Pike. Alice Reichert MD, MD Referring MD:          Halina Maidens, MD (Referring MD) Medicines:             Propofol per Anesthesia Complications:         No immediate complications. Procedure:             Pre-Anesthesia Assessment:                        - The risks and benefits of the procedure and the                         sedation options and risks were discussed with the                         patient. All questions were answered and informed                         consent was obtained.                        - Patient identification and proposed procedure were                         verified prior to the procedure by the nurse. The                         procedure was verified in the procedure room.                        - ASA Grade Assessment: III - A patient with severe                         systemic disease.                        - After reviewing the risks and benefits, the patient                         was deemed in satisfactory condition to undergo the                         procedure.                        After obtaining informed consent, the endoscope was                         passed under direct vision. Throughout the procedure,                         the patient's  blood pressure, pulse, and oxygen                         saturations were monitored continuously. The Endoscope                         was introduced through the mouth, and advanced to the                         third part of duodenum. The upper GI endoscopy was                          accomplished without difficulty. The patient tolerated                         the procedure well. Findings:      The examined esophagus was normal.      There is no endoscopic evidence of bleeding, esophagitis, inflammation,       ulcerations or varices in the entire esophagus.      The entire examined stomach was normal.      The examined duodenum was normal. Impression:            - Normal esophagus.                        - Normal stomach.                        - Normal examined duodenum.                        - No specimens collected. Recommendation:        - Patient has a contact number available for                         emergencies. The signs and symptoms of potential                         delayed complications were discussed with the patient.                         Return to normal activities tomorrow. Written                         discharge instructions were provided to the patient.                        - Resume previous diet.                        - Continue present medications.                        - Discharge patient to home (with escort).                        - Resume previous diet.                        - Return to GI office PRN.                        -  The findings and recommendations were discussed with                         the patient. Procedure Code(s):     --- Professional ---                        [phone], Esophagogastroduodenoscopy, flexible,                         transoral; diagnostic, including collection of                         specimen(s) by brushing or washing, when performed                         (separate procedure) Diagnosis Code(s):     --- Professional ---                        K92.0, Hematemesis CPT copyright 2019 American Medical Association. All rights reserved. The codes documented in this report are preliminary and upon coder review may  be revised to meet current compliance requirements. Efrain Sella MD,  MD 01/14/2022 2:08:52 PM This report has been signed electronically. Number of Addenda: 0 Note Initiated On: 01/14/2022 1:52 PM Estimated Blood Loss:  Estimated blood loss: none.      [facility]

## 2022-01-14 NOTE — Anesthesia Postprocedure Evaluation (Signed)
Anesthesia Post Note  Patient: Melanie Bell  Procedure(s) Performed: ESOPHAGOGASTRODUODENOSCOPY (EGD)  Patient location during evaluation: PACU Anesthesia Type: General Level of consciousness: awake and oriented Pain management: pain level controlled Vital Signs Assessment: post-procedure vital signs reviewed and stable Respiratory status: spontaneous breathing and respiratory function stable Cardiovascular status: stable Anesthetic complications: no   No notable events documented.   Last Vitals:  Vitals:   01/14/22 1224 01/14/22 1344  BP: 122/77 134/80  Pulse: 75 64  Resp: 18   Temp: 36.6 C (!) 35.7 C  SpO2: 98% 100%    Last Pain:  Vitals:   01/14/22 1344  TempSrc: Temporal  PainSc: 0-No pain                 VAN STAVEREN,Favour [place]

## 2022-01-14 NOTE — Interval H&P Note (Signed)
History and Physical Interval Note:  01/14/2022 1:53 PM  Melanie Bell  has presented today for surgery, with the diagnosis of GI BLEED.  The various methods of treatment have been discussed with the patient and family. After consideration of risks, benefits and other options for treatment, the patient has consented to  Procedure(s): ESOPHAGOGASTRODUODENOSCOPY (EGD) (N/A) as a surgical intervention.  The patient's history has been reviewed, patient examined, no change in status, stable for surgery.  I have reviewed the patient's chart and labs.  Questions were answered to the patient's satisfaction.     [place], [place]

## 2022-01-14 NOTE — Plan of Care (Signed)

## 2022-01-15 DIAGNOSIS — K920 Hematemesis: Secondary | ICD-10-CM | POA: Diagnosis not present

## 2022-01-15 LAB — BASIC METABOLIC PANEL
Anion gap: 6 (ref 5–15)
BUN: 10 mg/dL (ref 6–20)
CO2: 22 mmol/L (ref 22–32)
Calcium: 9.2 mg/dL (ref 8.9–10.3)
Chloride: 113 mmol/L — ABNORMAL HIGH (ref 98–111)
Creatinine, Ser: 0.71 mg/dL (ref 0.44–1.00)
GFR, Estimated: >60 mL/min (ref >60)
Glucose, Bld: 106 mg/dL — ABNORMAL HIGH (ref 70–99)
Potassium: 3.9 mmol/L (ref 3.5–5.1)
Sodium: 141 mmol/L (ref 135–145)

## 2022-01-15 LAB — CBC
HCT: 40.9 % (ref 36.0–46.0)
Hemoglobin: 13.3 g/dL (ref 12.0–15.0)
MCH: 29.4 pg (ref 26.0–34.0)
MCHC: 32.5 g/dL (ref 30.0–36.0)
MCV: 90.3 fL (ref 80.0–100.0)
Platelets: 209 10*3/uL (ref 150–400)
RBC: 4.53 MIL/uL (ref 3.87–5.11)
RDW: 13.1 % (ref 11.5–15.5)
WBC: 7.6 10*3/uL (ref 4.0–10.5)
nRBC: 0 % (ref 0.0–0.2)

## 2022-01-15 MED ORDER — ONDANSETRON HCL 4 MG PO TABS: 4.0000 mg | ORAL_TABLET | Freq: Four times a day (QID) | ORAL | 0 refills | Status: DC | PRN

## 2022-01-15 NOTE — Discharge Summary (Signed)
Physician Discharge Summary   Patient: Melanie Bell MRN: [id number] DOB: Jun 05, 1990  Admit date:     01/14/2022  Discharge date: 01/15/22  Discharge Physician: Lorelei Pont   PCP: Glean Hess, MD   Recommendations at discharge:    Follow up with PCP after discharge  Discharge Diagnoses: Principal Problem:   Hematemesis   Hospital Course: Melanie Bell is a 31 y.o. female with medical history significant of Schizoaffective Disorder (with multiple prior psychiatric hospitalizations), Fibromyalgia and Migraines (on NSAIDs daily), history of two ex laparotomies and multiple endoscopies due to various foreign body ingestions (batteries, thumbtacks, razor blades), and GERD who presents to the ED with reported nausea and vomiting associated with bright red blood clots that has been ongoing for one week. Endoscopy was performed without acute pathology found. Tolerating PO on day of discharge and without abdominal pain.   Assessment and Plan:  Melanie Bell is a 31 y.o. female with medical history significant of Schizoaffective Disorder (with multiple prior psychiatric hospitalizations), Fibromyalgia and Migraines (on NSAIDs daily), history of two ex laparotomies and multiple endoscopies due to various foreign body ingestions (batteries, thumbtacks, razor blades), and GERD who presents to the ED with reported nausea and vomiting associated with bright red blood clots that has been ongoing for one week.  Acute Hematemesis:  Reported nausea and vomiting associated with bright red blood clots that has been ongoing for one week. Uses NSAIDS occasoinally. No other obvious triggers. Vitals are stable.  HH is 13/41.  BUN/Cr is 12/0.6.  Reticulocyte count is not elevated. CT abd/pelvis 11/2021 unremarkable. Endoscopy was performed and no acute pathology was found. Etiology likely irritation 2/2 vomiting.  - Discontinue home Mobic and other NSAIDs. -given PO zofran for nausea    Nausea and Vomiting: 12/01/21 CT of the Abdomen showed cholelithiasis. RUQ Cholelithiasis without evidence to suggest acute cholecystitis or biliary obstruction by ultrasound. Able to tolerate PO on day of discharge and no RUQ pain   GERD: - home protonix   All other home medications were continued.        Consultants: GI Procedures performed: endoscopy  Disposition: Home Diet recommendation:  Discharge Diet Orders (From admission, onward)     Start     Ordered   01/15/22 0000  Diet - low sodium heart healthy        01/15/22 1050           Regular diet DISCHARGE MEDICATION: Allergies as of 01/15/2022       Reactions   Betadine [povidone Iodine] Other (See Comments)   Reaction:  Unknown    Fish Allergy Hives   Iodine Rash   Shellfish-derived Products Other (See Comments)   Reaction:  Unknown         Medication List     TAKE these medications    albuterol 108 (90 Base) MCG/ACT inhaler Commonly known as: VENTOLIN HFA 2 puffs four times a day as needed   ARIPiprazole 5 MG tablet Commonly known as: ABILIFY Take 5 mg by mouth daily.   cabergoline 0.5 MG tablet Commonly known as: DOSTINEX Take 1 tablet (0.5 mg total) by mouth 2 (two) times a week.   escitalopram 10 MG tablet Commonly known as: LEXAPRO Take 10 mg by mouth daily.   fluticasone-salmeterol 45-21 MCG/ACT inhaler Commonly known as: ADVAIR HFA Inhale 2 puffs into the lungs 2 (two) times daily.   hydrOXYzine 50 MG capsule Commonly known as: VISTARIL Take 50 mg by mouth 2 (two) times  daily as needed.   levothyroxine 75 MCG tablet Commonly known as: SYNTHROID Take 1 tablet (75 mcg total) by mouth as directed. Two tablets on Sundays and Wednesday and 1 tablet rest of the week   lithium carbonate 300 MG capsule Take 1 capsule (300 mg total) by mouth 2 (two) times daily with a meal.   magnesium oxide 400 MG tablet Commonly known as: MAG-OX Take 400 mg by mouth daily.   meloxicam 7.5 MG  tablet Commonly known as: MOBIC Take 7.5 mg by mouth daily.   omeprazole 20 MG capsule Commonly known as: PRILOSEC TAKE 1 CAPSULE (20 MG TOTAL) BY MOUTH DAILY.   ondansetron 4 MG tablet Commonly known as: ZOFRAN Take 1 tablet (4 mg total) by mouth every 6 (six) hours as needed for nausea.   prazosin 2 MG capsule Commonly known as: MINIPRESS Take 1 capsule (2 mg total) by mouth at bedtime.   pregabalin 50 MG capsule Commonly known as: LYRICA Take 50 mg by mouth 2 (two) times daily.   prenatal multivitamin Tabs tablet Take 1 tablet by mouth daily at 12 noon.   SUMAtriptan 20 MG/ACT nasal spray Commonly known as: IMITREX SMARTSIG:Both Nares   topiramate 25 MG tablet Commonly known as: TOPAMAX Take 25 mg by mouth 2 (two) times daily.        Discharge Exam: Filed Weights   01/14/22 0909  Weight: 131.5 kg   Physical Exam Vitals and nursing note reviewed.  Constitutional:      Appearance: Normal appearance. She is not ill-appearing.  HENT:     Head: Normocephalic and atraumatic.     Mouth/Throat:     Mouth: Mucous membranes are moist.  Cardiovascular:     Rate and Rhythm: Normal rate and regular rhythm.  Abdominal:     General: Abdomen is flat. There is no distension.     Palpations: Abdomen is soft. There is no mass.     Tenderness: There is no abdominal tenderness.  Musculoskeletal:     Right lower leg: No edema.     Left lower leg: No edema.  Skin:    General: Skin is warm and dry.     Capillary Refill: Capillary refill takes less than 2 seconds.  Neurological:     Mental Status: She is alert.  Psychiatric:        Mood and Affect: Mood normal.        Behavior: Behavior normal.      Condition at discharge: good  The results of significant diagnostics from this hospitalization (including imaging, microbiology, ancillary and laboratory) are listed below for reference.   Imaging Studies: US ABDOMEN LIMITED RUQ (LIVER/GB)  Result Date:  01/14/2022 CLINICAL DATA:  Right upper quadrant abdominal pain. EXAM: ULTRASOUND ABDOMEN LIMITED RIGHT UPPER QUADRANT COMPARISON:  CT of the abdomen and pelvis on 12/01/2021 FINDINGS: Gallbladder: Cholelithiasis present with multiple shadowing gallstones. No evidence of gallbladder wall thickening, distension or sonographic Murphy sign. Common bile duct: Diameter: 4 mm Liver: No focal lesion identified. Within normal limits in parenchymal echogenicity. Portal vein is patent on color Doppler imaging with normal direction of blood flow towards the liver. Other: None. IMPRESSION: Cholelithiasis without evidence to suggest acute cholecystitis or biliary obstruction by ultrasound. Electronically Signed   By: Aletta Edouard M.D.   On: 01/14/2022 11:55   DG Chest Portable 1 View  Result Date: 01/14/2022 CLINICAL DATA:  31 year old female presents for evaluation of stomach pain. EXAM: PORTABLE CHEST 1 VIEW COMPARISON:  November 30, 2021  FINDINGS: Trachea midline. Cardiomediastinal contours and hilar structures are normal. Lungs are clear. No pneumothorax.  No sign of pleural effusion on frontal radiograph. No sign of free air beneath either the RIGHT or LEFT hemidiaphragm. On limited assessment no acute skeletal findings. IMPRESSION: No active disease. Electronically Signed   By: Zetta Bills M.D.   On: 01/14/2022 10:46    Microbiology: Results for orders placed or performed during the hospital encounter of 01/14/22  SARS Coronavirus 2 by RT PCR (hospital order, performed in [facility] hospital lab) *cepheid single result test* Anterior Nasal Swab     Status: None   Collection Time: 01/14/22 10:01 AM   Specimen: Anterior Nasal Swab  Result Value Ref Range Status   SARS Coronavirus 2 by RT PCR NEGATIVE NEGATIVE Final    Comment: (NOTE) SARS-CoV-2 target nucleic acids are NOT DETECTED.  The SARS-CoV-2 RNA is generally detectable in upper and lower respiratory specimens during the acute phase of infection. The  lowest concentration of SARS-CoV-2 viral copies this assay can detect is 250 copies / mL. A negative result does not preclude SARS-CoV-2 infection and should not be used as the sole basis for treatment or other patient management decisions.  A negative result may occur with improper specimen collection / handling, submission of specimen other than nasopharyngeal swab, presence of viral mutation(s) within the areas targeted by this assay, and inadequate number of viral copies (<250 copies / mL). A negative result must be combined with clinical observations, patient history, and epidemiological information.  Fact Sheet for Patients:   https://www.[facility] Smith.info/  Fact Sheet for Healthcare Providers: https://hall.com/  This test is not yet approved or  cleared by the [place] FDA and has been authorized for detection and/or diagnosis of SARS-CoV-2 by FDA under an Emergency Use Authorization (EUA).  This EUA will remain in effect (meaning this test can be used) for the duration of the COVID-19 declaration under Section 564(b)(1) of the Act, 21 U.S.C. section 360bbb-3(b)(1), unless the authorization is terminated or revoked sooner.  Performed at [facility], [address] [place],  [address]     Labs: CBC: Recent Labs  Lab 01/14/22 1001 01/15/22 0500  WBC 9.3 7.6  NEUTROABS 6.9  --   HGB 13.4 13.3  HCT 41.0 40.9  MCV 90.3 90.3  PLT 219 893   Basic Metabolic Panel: Recent Labs  Lab 01/14/22 1001 01/15/22 0500  NA 140 141  K 4.0 3.9  CL 114* 113*  CO2 22 22  GLUCOSE 107* 106*  BUN 12 10  CREATININE 0.60 0.71  CALCIUM 9.6 9.2   Liver Function Tests: Recent Labs  Lab 01/14/22 1001  AST 17  ALT 12  ALKPHOS 80  BILITOT 0.8  PROT 7.5  ALBUMIN 3.9   CBG: No results for input(s): "GLUCAP" in the last 168 hours.  Discharge time spent: less than 30 minutes.  Signed: Lorelei Pont, MD [facility] 01/15/2022

## 2022-01-15 NOTE — Hospital Course (Signed)
Melanie Bell is a 31 y.o. female with medical history significant of Schizoaffective Disorder (with multiple prior psychiatric hospitalizations), Fibromyalgia and Migraines (on NSAIDs daily), history of two ex laparotomies and multiple endoscopies due to various foreign body ingestions (batteries, thumbtacks, razor blades), and GERD who presents to the ED with reported nausea and vomiting associated with bright red blood clots that has been ongoing for one week. Endoscopy was performed without acute pathology found. Tolerating PO on day of discharge and without abdominal pain.

## 2022-01-18 ENCOUNTER — Encounter: Payer: Self-pay | Admitting: Internal Medicine

## 2022-01-18 ENCOUNTER — Telehealth: Payer: Self-pay

## 2022-01-18 NOTE — Telephone Encounter (Signed)
Transition Care Management Follow-up Telephone Call Date of discharge and from where: 01/15/2022 [facility] How have you been since you were released from the hospital? Fatigued but better Any questions or concerns? No  Items Reviewed: Did the pt receive and understand the discharge instructions provided? No  Medications obtained and verified? Yes  Other?  N/A Any new allergies since your discharge? No  Dietary orders reviewed? Yes Do you have support at home? Yes   Home Care and Equipment/Supplies: Were home health services ordered? no If so, what is the name of the agency? N/A  Has the agency set up a time to come to the patient's home? not applicable Were any new equipment or medical supplies ordered?  No What is the name of the medical supply agency? N/A Were you able to get the supplies/equipment? not applicable Do you have any questions related to the use of the equipment or supplies? No  Functional Questionnaire: (I = Independent and D = Dependent) ADLs: I  Bathing/Dressing- I  Meal Prep- I  Eating- I  Maintaining continence- I  Transferring/Ambulation- I  Managing Meds- I  Follow up appointments reviewed:  PCP Hospital f/u appt confirmed?  Patient declined [facility] f/u appt confirmed? Yes  Scheduled to see Dr. Olean Ree on 01/31/2022 @ 10AM. Are transportation arrangements needed? No  If their condition worsens, is the pt aware to call PCP or go to the Emergency Dept.? Yes Was the patient provided with contact information for the PCP's office or ED? Yes Was to pt encouraged to call back with questions or concerns? Yes

## 2022-01-20 ENCOUNTER — Ambulatory Visit: Payer: Medicaid Other | Admitting: Internal Medicine

## 2022-01-24 ENCOUNTER — Ambulatory Visit
Admission: EM | Admit: 2022-01-24 | Discharge: 2022-01-24 | Disposition: A | Discharge status: Home / Self Care | Payer: Medicaid Other | Attending: Physician Assistant | Admitting: Physician Assistant

## 2022-01-24 ENCOUNTER — Encounter: Payer: Self-pay | Admitting: Emergency Medicine

## 2022-01-24 DIAGNOSIS — L03317 Cellulitis of buttock: Secondary | ICD-10-CM | POA: Diagnosis not present

## 2022-01-24 MED ORDER — MUPIROCIN 2 % EX OINT
1.0000 | TOPICAL_OINTMENT | Freq: Two times a day (BID) | CUTANEOUS | 0 refills | Status: DC
Start: 2022-01-24 — End: 2022-04-19

## 2022-01-24 MED ORDER — CEPHALEXIN 500 MG PO CAPS: 500.0000 mg | ORAL_CAPSULE | Freq: Two times a day (BID) | ORAL | 0 refills | Status: AC

## 2022-01-24 NOTE — Discharge Instructions (Signed)
-  There is a very mild skin infection.  I have sent antibiotics to the pharmacy both oral and topical. - Clean the area with soap and water and apply the ointment 2-3 times a day. - May apply warm rags to the area. - Should be continuing to improve over the next couple days but if you feel that the swelling or pain worsen, you should return for reevaluation.

## 2022-01-24 NOTE — ED Triage Notes (Signed)
Pt presents with a small bump on her left thigh/buttock area that has been draining since last night.

## 2022-01-24 NOTE — ED Provider Notes (Signed)
[facility]    CSN: [id number] Arrival date & time: 01/24/22  1114      History   Chief Complaint Chief Complaint  Patient presents with   Mass    HPI Melanie Bell is a 31 y.o. female presenting for a painful bump of the left thigh/buttocks that she noticed last night.  She says it she reports that it opened and started draining last night.  Reports a lot of pustular material coming out.  She says her entire hip hurts.  She has also felt feverish and taken Tylenol.  Patient says she has been trying to keep the area clean and covered.  Has also been applying Neosporin.  She has no history of recurrent skin infections or MRSA.  No known insect bites or stings.  No other complaints.  HPI  Past Medical History:  Diagnosis Date   Allergy    Anxiety    Arthritis    Asthma    Depression    Foreign body aspiration    GERD (gastroesophageal reflux disease)    Hallucinations 09/30/2014   Sizoaffective   Hyperlipidemia    Hypertension    Intentional ingestion of batteries 04/21/2016    2 AAA batteries and 1 thumb tack; intent to hurt herself/notes 48/09/4625   Self-inflicted injury 03/50/0938   Suicidal ideation 01/02/2018   Tardive dyskinesia 10/2014   recent onset   Thyroid disease     Patient Active Problem List   Diagnosis Date Noted   Hematemesis 01/14/2022   Encounter for supervision of other normal pregnancy, first trimester 11/12/2021   Morbid obesity ([employer] [facility]) 09/30/2021   Acquired hypothyroidism 06/25/2021   Migraine without aura and without status migrainosus, not intractable 03/05/2021   Mild intermittent asthma without complication 18/29/9371   Gastroesophageal reflux disease 12/01/2020   Hypoglycemia 12/01/2020   Rheumatoid factor positive 12/09/2019   SS-A antibody positive 10/28/2019   Chronic pain of both knees 10/28/2019   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   Schizoaffective disorder, depressive type ([place]) 02/02/2018   Suicide attempt  ([place]) 01/16/2018   Dysfunctional uterine bleeding 01/06/2018   Borderline personality disorder ([place]) 01/04/2018   PTSD (post-traumatic stress disorder) 01/03/2018   ASCUS of cervix with negative high risk HPV 08/28/2017   Malingering 05/06/2016    Past Surgical History:  Procedure Laterality Date   ABDOMINAL SURGERY     "years ago" to remove foreign objects   APPENDECTOMY     BREAST EXCISIONAL BIOPSY Right 09/03/2007   surgical bx removed fibroadeoma   BREAST LUMPECTOMY Right    COLONOSCOPY WITH PROPOFOL N/A 09/10/2015   Procedure: COLONOSCOPY WITH PROPOFOL;  Surgeon: [name], MD;  Location: [facility] ENDOSCOPY;  Service: Endoscopy;  Laterality: N/A;   ESOPHAGOGASTRODUODENOSCOPY N/A 11/28/2014   Procedure: ESOPHAGOGASTRODUODENOSCOPY (EGD);  Surgeon: [name], MD;  Location: [facility] ENDOSCOPY;  Service: Endoscopy;  Laterality: N/A;   ESOPHAGOGASTRODUODENOSCOPY N/A 02/21/2016   Procedure: ESOPHAGOGASTRODUODENOSCOPY (EGD);  Surgeon: Mauri Pole, MD;  Location: [facility] ENDOSCOPY;  Service: Endoscopy;  Laterality: N/A;   ESOPHAGOGASTRODUODENOSCOPY N/A 04/21/2016   Procedure: ESOPHAGOGASTRODUODENOSCOPY (EGD);  Surgeon: Gatha Mayer, MD;  Location: [facility] ENDOSCOPY;  Service: Endoscopy;  Laterality: N/A;   ESOPHAGOGASTRODUODENOSCOPY N/A 05/06/2016   Procedure: ESOPHAGOGASTRODUODENOSCOPY (EGD);  Surgeon: Jonathon Bellows, MD;  Location: [facility] ENDOSCOPY;  Service: Gastroenterology;  Laterality: N/A;   ESOPHAGOGASTRODUODENOSCOPY N/A 06/13/2016   Procedure: ESOPHAGOGASTRODUODENOSCOPY (EGD);  Surgeon: [name], MD;  Location: [facility] ENDOSCOPY;  Service: Endoscopy;  Laterality: N/A;   ESOPHAGOGASTRODUODENOSCOPY N/A 01/14/2022  Procedure: ESOPHAGOGASTRODUODENOSCOPY (EGD);  Surgeon: Toledo, Benay Pike, MD;  Location: [facility] ENDOSCOPY;  Service: Gastroenterology;  Laterality: N/A;   ESOPHAGOGASTRODUODENOSCOPY (EGD) WITH PROPOFOL N/A 02/29/2016   Procedure: ESOPHAGOGASTRODUODENOSCOPY (EGD) WITH  PROPOFOL;  Surgeon: [name], MD;  Location: [facility] ENDOSCOPY;  Service: Endoscopy;  Laterality: N/A;   ESOPHAGOGASTRODUODENOSCOPY (EGD) WITH PROPOFOL N/A 01/15/2018   Procedure: ESOPHAGOGASTRODUODENOSCOPY (EGD) WITH PROPOFOL;  Surgeon: Daneil Dolin, MD;  [facility] ENDO SUITE;  Service: Endoscopy;  Laterality: N/A;  retrieval forein body   LAPAROTOMY N/A 09/12/2015   Procedure: EXPLORATORY LAPAROTOMY;  Surgeon: Florene Glen, MD;  Location: [facility] ORS;  Service: General;  Laterality: N/A;   SIGMOIDOSCOPY N/A 09/12/2015   Procedure: Lonell Face;  Surgeon: Florene Glen, MD;  Location: [facility] ORS;  Service: General;  Laterality: N/A;   WISDOM TOOTH EXTRACTION      OB History     Gravida  4   Para  0   Term  0   Preterm  0   AB  3   Living  0      SAB  3   IAB  0   Ectopic  0   Multiple  0   Live Births               Home Medications    Prior to Admission medications   Medication Sig Start Date End Date Taking? Authorizing Provider  cephALEXin (KEFLEX) 500 MG capsule Take 1 capsule (500 mg total) by mouth 2 (two) times daily for 5 days. 01/24/22 01/29/22 Yes Danton Clap, PA-C  mupirocin ointment (BACTROBAN) 2 % Apply 1 Application topically 2 (two) times daily. 01/24/22  Yes Danton Clap, PA-C  albuterol (VENTOLIN HFA) 108 (90 Base) MCG/ACT inhaler 2 puffs four times a day as needed 09/03/21   Glean Hess, MD  ARIPiprazole (ABILIFY) 5 MG tablet Take 5 mg by mouth daily. 12/03/21   [provider]  cabergoline (DOSTINEX) 0.5 MG tablet Take 1 tablet (0.5 mg total) by mouth 2 (two) times a week. 06/28/21   Shamleffer, Melanie Crazier, MD  escitalopram (LEXAPRO) 10 MG tablet Take 10 mg by mouth daily. 08/10/21   [provider]  fluticasone-salmeterol (ADVAIR HFA) 45-21 MCG/ACT inhaler Inhale 2 puffs into the lungs 2 (two) times daily. 09/03/21   Glean Hess, MD  hydrOXYzine (VISTARIL) 50 MG capsule Take 50 mg by mouth 2 (two)  times daily as needed. 08/20/19   [provider]  levothyroxine (SYNTHROID) 75 MCG tablet Take 1 tablet (75 mcg total) by mouth as directed. Two tablets on Sundays and Wednesday and 1 tablet rest of the week 11/18/21   Shamleffer, Melanie Crazier, MD  lithium carbonate 300 MG capsule Take 1 capsule (300 mg total) by mouth 2 (two) times daily with a meal. 12/11/17   [name], [name], MD  magnesium oxide (MAG-OX) 400 MG tablet Take 400 mg by mouth daily.    [provider]  meloxicam (MOBIC) 7.5 MG tablet Take 7.5 mg by mouth daily. 02/22/21   [provider]  omeprazole (PRILOSEC) 20 MG capsule TAKE 1 CAPSULE (20 MG TOTAL) BY MOUTH DAILY. 09/30/21   Glean Hess, MD  ondansetron (ZOFRAN) 4 MG tablet Take 1 tablet (4 mg total) by mouth every 6 (six) hours as needed for nausea. 01/15/22   Lorelei Pont, MD  prazosin (MINIPRESS) 2 MG capsule Take 1 capsule (2 mg total) by mouth at bedtime. 01/12/18   McNew, Tyson Babinski, MD  pregabalin (LYRICA) 50 MG capsule Take 50 mg by mouth 2 (two) times daily. 07/12/21   [provider]  Prenatal Vit-Fe Fumarate-FA (PRENATAL MULTIVITAMIN) TABS tablet Take 1 tablet by mouth daily at 12 noon. Patient not taking: Reported on 01/14/2022    [provider]  SUMAtriptan Dellis Filbert) 20 MG/ACT nasal spray SMARTSIG:Both Nares 12/06/21   [provider]  topiramate (TOPAMAX) 25 MG tablet Take 25 mg by mouth 2 (two) times daily. 12/06/21   [provider]    Family History Family History  Problem Relation Age of Onset   Depression Mother    Hypertension Mother    Sleep apnea Mother    Asthma Mother    COPD Mother    Diabetes Mother    Anxiety disorder Mother    Arthritis Mother    Breast cancer Maternal Aunt        20's   Stroke Maternal Grandmother     Social History Social History   Tobacco Use   Smoking status: Former    Packs/day: 0.50    Years: 3.00    Total pack years: 1.50    Types: Cigarettes     Quit date: 08/14/2016    Years since quitting: 5.4   Smokeless tobacco: Never  Vaping Use   Vaping Use: Never used  Substance Use Topics   Alcohol use: No   Drug use: No     Allergies   Betadine [povidone iodine], Fish allergy, Iodine, and Shellfish-derived products   Review of Systems Review of Systems  Constitutional:  Positive for fever (subjective). Negative for fatigue.  Musculoskeletal:  Positive for arthralgias. Negative for myalgias.  Skin:  Positive for color change and wound. Negative for rash.  Neurological:  Negative for weakness.     Physical Exam Triage Vital Signs ED Triage Vitals  Enc Vitals Group     BP 01/24/22 1134 119/69     Pulse Rate 01/24/22 1134 62     Resp 01/24/22 1134 16     Temp 01/24/22 1134 98.4 F (36.9 C)     Temp Source 01/24/22 1134 Oral     SpO2 01/24/22 1134 99 %     Weight --      Height --      Head Circumference --      Peak Flow --      Pain Score 01/24/22 1132 8     Pain Loc --      Pain Edu? --      Excl. in [place]? --    No data found.  Updated Vital Signs BP 119/69 (BP Location: Left Wrist)   Pulse 62   Temp 98.4 F (36.9 C) (Oral)   Resp 16   LMP 01/13/2022 (Exact Date)   SpO2 99%   Physical Exam Vitals and nursing note reviewed.  Constitutional:      General: She is not in acute distress.    Appearance: Normal appearance. She is not ill-appearing or toxic-appearing.  HENT:     Head: Normocephalic and atraumatic.  Eyes:     General: No scleral icterus.       Right eye: No discharge.        Left eye: No discharge.     Conjunctiva/sclera: Conjunctivae normal.  Cardiovascular:     Rate and Rhythm: Normal rate and regular rhythm.     Heart sounds: Normal heart sounds.  Pulmonary:     Effort: Pulmonary effort is normal. No respiratory distress.  Breath sounds: Normal breath sounds.  Musculoskeletal:     Cervical back: Neck supple.  Skin:    General: Skin is dry.     Comments: Tiny area of erythema (1  cm x 1 cm) left lateral upper buttocks. No induration or fluctuance. No warmth. No drainage. TTP.   Neurological:     General: No focal deficit present.     Mental Status: She is alert. Mental status is at baseline.     Motor: No weakness.     Gait: Gait normal.  Psychiatric:        Mood and Affect: Mood normal.        Behavior: Behavior normal.        Thought Content: Thought content normal.      UC Treatments / Results  Labs (all labs ordered are listed, but only abnormal results are displayed) Labs Reviewed - No data to display  EKG   Radiology No results found.  Procedures Procedures (including critical care time)  Medications Ordered in UC Medications - No data to display  Initial Impression / Assessment and Plan / UC Course  I have reviewed the triage vital signs and the nursing notes.  Pertinent labs & imaging results that were available during my care of the patient were reviewed by me and considered in my medical decision making (see chart for details).   31 year old female presents for painful bump of the left thigh/buttocks that has started to drain overnight.  Vitals are normal and stable and she is overall well-appearing.  On exam, patient has tiny area of erythema without any drainage.  We will treat minor infection at this time with 5-day course of Keflex and mupirocin ointment.  Advised close monitoring and discussed wound care with patient.  Advised to follow-up if no improvement in the next couple days or symptoms worsen.  Advised emergency department if fever, fatigue, feeling ill or significant worsening in condition.   Final Clinical Impressions(s) / UC Diagnoses   Final diagnoses:  Cellulitis of buttock     Discharge Instructions      -There is a very mild skin infection.  I have sent antibiotics to the pharmacy both oral and topical. - Clean the area with soap and water and apply the ointment 2-3 times a day. - May apply warm rags to the  area. - Should be continuing to improve over the next couple days but if you feel that the swelling or pain worsen, you should return for reevaluation.     ED Prescriptions     Medication Sig Dispense Auth. Provider   cephALEXin (KEFLEX) 500 MG capsule Take 1 capsule (500 mg total) by mouth 2 (two) times daily for 5 days. 10 capsule Laurene Footman B, PA-C   mupirocin ointment (BACTROBAN) 2 % Apply 1 Application topically 2 (two) times daily. 22 g Danton Clap, PA-C      PDMP not reviewed this encounter.   Danton Clap, PA-C 01/24/22 1215

## 2022-01-31 ENCOUNTER — Encounter: Payer: Self-pay | Admitting: Surgery

## 2022-01-31 ENCOUNTER — Telehealth: Payer: Self-pay | Admitting: Surgery

## 2022-01-31 ENCOUNTER — Ambulatory Visit (INDEPENDENT_AMBULATORY_CARE_PROVIDER_SITE_OTHER): Payer: Medicaid Other | Admitting: Surgery

## 2022-01-31 VITALS — BP 121/66 | HR 70 | Temp 98.7°F | Ht 66.0 in | Wt 287.0 lb

## 2022-01-31 DIAGNOSIS — K8020 Calculus of gallbladder without cholecystitis without obstruction: Secondary | ICD-10-CM | POA: Diagnosis not present

## 2022-01-31 NOTE — Progress Notes (Signed)
01/31/2022  Reason for Visit: Symptomatic cholelithiasis  Requesting Provider:  Halina Maidens, MD  History of Present Illness: Melanie Bell is a 31 y.o. female presenting for evaluation of symptomatic cholelithiasis.  The patient reports that since January of this year, she has had multiple episodes of upper abdominal pain that radiates to the back associated with nausea and vomiting.  She was actually admitted on 01/14/2022 after she was having abdominal pain with vomiting and noticed that she was having blood in the emesis.  EGD did not show any acute pathology and was thought that the bleeding came from excessive straining from vomiting.  During that admission she had an ultrasound of the right upper quadrant which showed cholelithiasis with stones up to 1.86 cm with a decompressed gallbladder but no gallbladder wall thickening or fluid.  After discussion with Dr. Army Melia as an outpatient, she was recommended a referral to see general surgery for possible cholecystectomy.  The patient thinks that her symptoms from back in January of this year have all been related to her gallbladder rather than GERD as she initially thought.  Of note, the patient has a history of 3 laparotomies for foreign body ingestion.  Her most recent laparotomy was in September 2020 at [facility].  Also, the patient reports a family history of cholecystectomy with her mother and sister having had cholecystectomy in the past.  Past Medical History: Past Medical History:  Diagnosis Date   Allergy    Anxiety    Arthritis    Asthma    Depression    Foreign body aspiration    GERD (gastroesophageal reflux disease)    Hallucinations 09/30/2014   Sizoaffective   Hyperlipidemia    Hypertension    Intentional ingestion of batteries 04/21/2016    2 AAA batteries and 1 thumb tack; intent to hurt herself/notes 33/12/2503   Self-inflicted injury 39/76/7341   Suicidal ideation 01/02/2018   Tardive dyskinesia 10/2014   recent  onset   Thyroid disease      Past Surgical History: Past Surgical History:  Procedure Laterality Date   ABDOMINAL SURGERY     "years ago" to remove foreign objects   APPENDECTOMY     BREAST EXCISIONAL BIOPSY Right 09/03/2007   surgical bx removed fibroadeoma   BREAST LUMPECTOMY Right    COLONOSCOPY WITH PROPOFOL N/A 09/10/2015   Procedure: COLONOSCOPY WITH PROPOFOL;  Surgeon: [name], MD;  Location: [facility] ENDOSCOPY;  Service: Endoscopy;  Laterality: N/A;   ESOPHAGOGASTRODUODENOSCOPY N/A 11/28/2014   Procedure: ESOPHAGOGASTRODUODENOSCOPY (EGD);  Surgeon: [name], MD;  Location: [facility] ENDOSCOPY;  Service: Endoscopy;  Laterality: N/A;   ESOPHAGOGASTRODUODENOSCOPY N/A 02/21/2016   Procedure: ESOPHAGOGASTRODUODENOSCOPY (EGD);  Surgeon: Mauri Pole, MD;  Location: [facility] ENDOSCOPY;  Service: Endoscopy;  Laterality: N/A;   ESOPHAGOGASTRODUODENOSCOPY N/A 04/21/2016   Procedure: ESOPHAGOGASTRODUODENOSCOPY (EGD);  Surgeon: Gatha Mayer, MD;  Location: [facility] ENDOSCOPY;  Service: Endoscopy;  Laterality: N/A;   ESOPHAGOGASTRODUODENOSCOPY N/A 05/06/2016   Procedure: ESOPHAGOGASTRODUODENOSCOPY (EGD);  Surgeon: Jonathon Bellows, MD;  Location: [facility] ENDOSCOPY;  Service: Gastroenterology;  Laterality: N/A;   ESOPHAGOGASTRODUODENOSCOPY N/A 06/13/2016   Procedure: ESOPHAGOGASTRODUODENOSCOPY (EGD);  Surgeon: [name], MD;  Location: [facility] ENDOSCOPY;  Service: Endoscopy;  Laterality: N/A;   ESOPHAGOGASTRODUODENOSCOPY N/A 01/14/2022   Procedure: ESOPHAGOGASTRODUODENOSCOPY (EGD);  Surgeon: Toledo, Benay Pike, MD;  Location: [facility] ENDOSCOPY;  Service: Gastroenterology;  Laterality: N/A;   ESOPHAGOGASTRODUODENOSCOPY (EGD) WITH PROPOFOL N/A 02/29/2016   Procedure: ESOPHAGOGASTRODUODENOSCOPY (EGD) WITH PROPOFOL;  Surgeon: [name], MD;  Location: [facility] ENDOSCOPY;  Service: Endoscopy;  Laterality: N/A;   ESOPHAGOGASTRODUODENOSCOPY (EGD) WITH PROPOFOL N/A 01/15/2018   Procedure:  ESOPHAGOGASTRODUODENOSCOPY (EGD) WITH PROPOFOL;  Surgeon: Daneil Dolin, MD;  Location: [facility] ENDO SUITE;  Service: Endoscopy;  Laterality: N/A;  retrieval forein body   LAPAROTOMY N/A 09/12/2015   Procedure: EXPLORATORY LAPAROTOMY;  Surgeon: Florene Glen, MD;  Location: [facility] ORS;  Service: General;  Laterality: N/A;   SIGMOIDOSCOPY N/A 09/12/2015   Procedure: Lonell Face;  Surgeon: Florene Glen, MD;  Location: [facility] ORS;  Service: General;  Laterality: N/A;   WISDOM TOOTH EXTRACTION      Home Medications: Prior to Admission medications   Medication Sig Start Date End Date Taking? Authorizing Provider  albuterol (VENTOLIN HFA) 108 (90 Base) MCG/ACT inhaler 2 puffs four times a day as needed 09/03/21  Yes Glean Hess, MD  ARIPiprazole (ABILIFY) 5 MG tablet Take 5 mg by mouth daily. 12/03/21  Yes [provider]  cabergoline (DOSTINEX) 0.5 MG tablet Take 1 tablet (0.5 mg total) by mouth 2 (two) times a week. 06/28/21  Yes Shamleffer, Melanie Crazier, MD  escitalopram (LEXAPRO) 10 MG tablet Take 10 mg by mouth daily. 08/10/21  Yes [provider]  fluticasone-salmeterol (ADVAIR HFA) 45-21 MCG/ACT inhaler Inhale 2 puffs into the lungs 2 (two) times daily. 09/03/21  Yes Glean Hess, MD  hydrOXYzine (VISTARIL) 50 MG capsule Take 50 mg by mouth 2 (two) times daily as needed. 08/20/19  Yes [provider]  levothyroxine (SYNTHROID) 75 MCG tablet Take 1 tablet (75 mcg total) by mouth as directed. Two tablets on Sundays and Wednesday and 1 tablet rest of the week 11/18/21  Yes Shamleffer, Melanie Crazier, MD  lithium carbonate 300 MG capsule Take 1 capsule (300 mg total) by mouth 2 (two) times daily with a meal. 12/11/17  Yes [name], [name], MD  magnesium oxide (MAG-OX) 400 MG tablet Take 400 mg by mouth daily.   Yes [provider]  meloxicam (MOBIC) 7.5 MG tablet Take 7.5 mg by mouth daily. 02/22/21  Yes [provider]  mupirocin ointment  (BACTROBAN) 2 % Apply 1 Application topically 2 (two) times daily. 01/24/22  Yes Danton Clap, PA-C  omeprazole (PRILOSEC) 20 MG capsule TAKE 1 CAPSULE (20 MG TOTAL) BY MOUTH DAILY. 09/30/21  Yes Glean Hess, MD  ondansetron (ZOFRAN) 4 MG tablet Take 1 tablet (4 mg total) by mouth every 6 (six) hours as needed for nausea. 01/15/22  Yes Lorelei Pont, MD  prazosin (MINIPRESS) 2 MG capsule Take 1 capsule (2 mg total) by mouth at bedtime. 01/12/18  Yes McNew, Tyson Babinski, MD  pregabalin (LYRICA) 50 MG capsule Take 50 mg by mouth 2 (two) times daily. 07/12/21  Yes [provider]  SUMAtriptan (IMITREX) 20 MG/ACT nasal spray Place 20 mg into the nose as needed. 12/06/21  Yes [provider]  topiramate (TOPAMAX) 25 MG tablet Take 25 mg by mouth 2 (two) times daily. 12/06/21  Yes [provider]    Allergies: Allergies  Allergen Reactions   Betadine [Povidone Iodine] Other (See Comments)    Reaction:  Unknown    Fish Allergy Hives   Iodine Rash   Shellfish-Derived Products Other (See Comments)    Reaction:  Unknown     Social History:  reports that she quit smoking about 5 years ago. Her smoking use included cigarettes. She has a 1.50 pack-year smoking history. She has been exposed to tobacco smoke. She has never used smokeless tobacco. She  reports that she does not drink alcohol and does not use drugs.   Family History: Family History  Problem Relation Age of Onset   Depression Mother    Hypertension Mother    Sleep apnea Mother    Asthma Mother    COPD Mother    Diabetes Mother    Anxiety disorder Mother    Arthritis Mother    Breast cancer Maternal Aunt        20's   Stroke Maternal Grandmother     Review of Systems: Review of Systems  Constitutional:  Negative for chills and fever.  HENT:  Negative for hearing loss.   Respiratory:  Negative for shortness of breath.   Cardiovascular:  Negative for chest pain.  Gastrointestinal:  Positive for  abdominal pain, nausea and vomiting. Negative for constipation and diarrhea.  Genitourinary:  Negative for dysuria.  Musculoskeletal:  Negative for myalgias.  Skin:  Negative for rash.  Neurological:  Negative for dizziness.  Psychiatric/Behavioral:  Negative for depression.     Physical Exam BP 121/66   Pulse 70   Temp 98.7 F (37.1 C)   Ht '5\' 6"'$  (1.676 m)   Wt 287 lb (130.2 kg)   LMP 01/13/2022 (Exact Date)   SpO2 97%   BMI 46.32 kg/m  CONSTITUTIONAL: No acute distress HEENT:  Normocephalic, atraumatic, extraocular motion intact. NECK: Trachea is midline, and there is no jugular venous distension.  RESPIRATORY:  Lungs are clear, and breath sounds are equal bilaterally. Normal respiratory effort without pathologic use of accessory muscles. CARDIOVASCULAR: Heart is regular without murmurs, gallops, or rubs. GI: The abdomen is soft, obese, nondistended, with some tenderness to palpation in the right upper quadrant and less so in the epigastric area.  Negative Murphy sign.  He has a midline laparotomy scar that is well-healed.   MUSCULOSKELETAL:  Normal muscle strength and tone in all four extremities.  No peripheral edema or cyanosis. SKIN: Skin turgor is normal. There are no pathologic skin lesions.  NEUROLOGIC:  Motor and sensation is grossly normal.  Cranial nerves are grossly intact. PSYCH:  Alert and oriented to person, place and time. Affect is normal.  Laboratory Analysis: Labs from 01/15/2022: Sodium 141, potassium 3.9, chloride 113, CO2 22, BUN 10, creatinine 0.71.  WBC 7.6, hemoglobin 13.3, hematocrit 40.9, platelets 209.  Imaging: Ultrasound RUQ on 01/14/2022: IMPRESSION: Cholelithiasis without evidence to suggest acute cholecystitis or biliary obstruction by ultrasound.  Assessment and Plan: This is a 31 y.o. female with symptomatic cholelithiasis.  - Discussed with patient the findings on her ultrasound and showed the images to her so she could see the size of the  stones relative to a decompressed gallbladder.  Discussed with the patient how gallstones can form and that surgery is the mainstay of treatment in order to remove the source of the stones which is the gallbladder. -Discussed with the patient my plan for a robotic assisted cholecystectomy with ICG cholangiogram and reviewed the surgery at length with her including the incisions, risks of bleeding, infection, injury to surrounding structures, the use of ICG to evaluate the anatomy better, the potential for open cholecystectomy given her prior laparotomies, hospital stay versus outpatient surgery, postoperative activity restrictions and pain control, and she is willing to proceed. - We will tentatively try to schedule the patient for 02/03/2022, pending transportation issues for the patient.  If she is able to schedule transportation that day then we will proceed with 02/03/2022.  Otherwise we will have to book her for  10//23.  Patient understands this and all of her questions have been answered.  I spent 55 minutes dedicated to the care of this patient on the date of this encounter to include pre-visit review of records, face-to-face time with the patient discussing diagnosis and management, and any post-visit coordination of care.   Melvyn Neth, [facility]

## 2022-01-31 NOTE — H&P (View-Only) (Signed)
01/31/2022  Reason for Visit: Symptomatic cholelithiasis  Requesting Provider:  Halina Maidens, MD  History of Present Illness: Melanie Bell is a 31 y.o. female presenting for evaluation of symptomatic cholelithiasis.  The patient reports that since January of this year, she has had multiple episodes of upper abdominal pain that radiates to the back associated with nausea and vomiting.  She was actually admitted on 01/14/2022 after she was having abdominal pain with vomiting and noticed that she was having blood in the emesis.  EGD did not show any acute pathology and was thought that the bleeding came from excessive straining from vomiting.  During that admission she had an ultrasound of the right upper quadrant which showed cholelithiasis with stones up to 1.86 cm with a decompressed gallbladder but no gallbladder wall thickening or fluid.  After discussion with Dr. Army Melia as an outpatient, she was recommended a referral to see general surgery for possible cholecystectomy.  The patient thinks that her symptoms from back in January of this year have all been related to her gallbladder rather than GERD as she initially thought.  Of note, the patient has a history of 3 laparotomies for foreign body ingestion.  Her most recent laparotomy was in September 2020 at [facility].  Also, the patient reports a family history of cholecystectomy with her mother and sister having had cholecystectomy in the past.  Past Medical History: Past Medical History:  Diagnosis Date   Allergy    Anxiety    Arthritis    Asthma    Depression    Foreign body aspiration    GERD (gastroesophageal reflux disease)    Hallucinations 09/30/2014   Sizoaffective   Hyperlipidemia    Hypertension    Intentional ingestion of batteries 04/21/2016    2 AAA batteries and 1 thumb tack; intent to hurt herself/notes 27/06/5364   Self-inflicted injury 44/07/4740   Suicidal ideation 01/02/2018   Tardive dyskinesia 10/2014   recent  onset   Thyroid disease      Past Surgical History: Past Surgical History:  Procedure Laterality Date   ABDOMINAL SURGERY     "years ago" to remove foreign objects   APPENDECTOMY     BREAST EXCISIONAL BIOPSY Right 09/03/2007   surgical bx removed fibroadeoma   BREAST LUMPECTOMY Right    COLONOSCOPY WITH PROPOFOL N/A 09/10/2015   Procedure: COLONOSCOPY WITH PROPOFOL;  Surgeon: [name], MD;  Location: [facility] ENDOSCOPY;  Service: Endoscopy;  Laterality: N/A;   ESOPHAGOGASTRODUODENOSCOPY N/A 11/28/2014   Procedure: ESOPHAGOGASTRODUODENOSCOPY (EGD);  Surgeon: [name], MD;  Location: [facility] ENDOSCOPY;  Service: Endoscopy;  Laterality: N/A;   ESOPHAGOGASTRODUODENOSCOPY N/A 02/21/2016   Procedure: ESOPHAGOGASTRODUODENOSCOPY (EGD);  Surgeon: Mauri Pole, MD;  Location: [facility] ENDOSCOPY;  Service: Endoscopy;  Laterality: N/A;   ESOPHAGOGASTRODUODENOSCOPY N/A 04/21/2016   Procedure: ESOPHAGOGASTRODUODENOSCOPY (EGD);  Surgeon: Gatha Mayer, MD;  Location: [facility] ENDOSCOPY;  Service: Endoscopy;  Laterality: N/A;   ESOPHAGOGASTRODUODENOSCOPY N/A 05/06/2016   Procedure: ESOPHAGOGASTRODUODENOSCOPY (EGD);  Surgeon: Jonathon Bellows, MD;  Location: [facility] ENDOSCOPY;  Service: Gastroenterology;  Laterality: N/A;   ESOPHAGOGASTRODUODENOSCOPY N/A 06/13/2016   Procedure: ESOPHAGOGASTRODUODENOSCOPY (EGD);  Surgeon: [name], MD;  Location: [facility] ENDOSCOPY;  Service: Endoscopy;  Laterality: N/A;   ESOPHAGOGASTRODUODENOSCOPY N/A 01/14/2022   Procedure: ESOPHAGOGASTRODUODENOSCOPY (EGD);  Surgeon: Toledo, Benay Pike, MD;  Location: [facility] ENDOSCOPY;  Service: Gastroenterology;  Laterality: N/A;   ESOPHAGOGASTRODUODENOSCOPY (EGD) WITH PROPOFOL N/A 02/29/2016   Procedure: ESOPHAGOGASTRODUODENOSCOPY (EGD) WITH PROPOFOL;  Surgeon: [name], MD;  Location: [facility] ENDOSCOPY;  Service: Endoscopy;  Laterality: N/A;   ESOPHAGOGASTRODUODENOSCOPY (EGD) WITH PROPOFOL N/A 01/15/2018   Procedure:  ESOPHAGOGASTRODUODENOSCOPY (EGD) WITH PROPOFOL;  Surgeon: Daneil Dolin, MD;  Location: [facility] ENDO SUITE;  Service: Endoscopy;  Laterality: N/A;  retrieval forein body   LAPAROTOMY N/A 09/12/2015   Procedure: EXPLORATORY LAPAROTOMY;  Surgeon: Florene Glen, MD;  Location: [facility] ORS;  Service: General;  Laterality: N/A;   SIGMOIDOSCOPY N/A 09/12/2015   Procedure: Lonell Face;  Surgeon: Florene Glen, MD;  Location: [facility] ORS;  Service: General;  Laterality: N/A;   WISDOM TOOTH EXTRACTION      Home Medications: Prior to Admission medications   Medication Sig Start Date End Date Taking? Authorizing Provider  albuterol (VENTOLIN HFA) 108 (90 Base) MCG/ACT inhaler 2 puffs four times a day as needed 09/03/21  Yes Glean Hess, MD  ARIPiprazole (ABILIFY) 5 MG tablet Take 5 mg by mouth daily. 12/03/21  Yes [provider]  cabergoline (DOSTINEX) 0.5 MG tablet Take 1 tablet (0.5 mg total) by mouth 2 (two) times a week. 06/28/21  Yes Shamleffer, Melanie Crazier, MD  escitalopram (LEXAPRO) 10 MG tablet Take 10 mg by mouth daily. 08/10/21  Yes [provider]  fluticasone-salmeterol (ADVAIR HFA) 45-21 MCG/ACT inhaler Inhale 2 puffs into the lungs 2 (two) times daily. 09/03/21  Yes Glean Hess, MD  hydrOXYzine (VISTARIL) 50 MG capsule Take 50 mg by mouth 2 (two) times daily as needed. 08/20/19  Yes [provider]  levothyroxine (SYNTHROID) 75 MCG tablet Take 1 tablet (75 mcg total) by mouth as directed. Two tablets on Sundays and Wednesday and 1 tablet rest of the week 11/18/21  Yes Shamleffer, Melanie Crazier, MD  lithium carbonate 300 MG capsule Take 1 capsule (300 mg total) by mouth 2 (two) times daily with a meal. 12/11/17  Yes [name], [name], MD  magnesium oxide (MAG-OX) 400 MG tablet Take 400 mg by mouth daily.   Yes [provider]  meloxicam (MOBIC) 7.5 MG tablet Take 7.5 mg by mouth daily. 02/22/21  Yes [provider]  mupirocin ointment  (BACTROBAN) 2 % Apply 1 Application topically 2 (two) times daily. 01/24/22  Yes Danton Clap, PA-C  omeprazole (PRILOSEC) 20 MG capsule TAKE 1 CAPSULE (20 MG TOTAL) BY MOUTH DAILY. 09/30/21  Yes Glean Hess, MD  ondansetron (ZOFRAN) 4 MG tablet Take 1 tablet (4 mg total) by mouth every 6 (six) hours as needed for nausea. 01/15/22  Yes Lorelei Pont, MD  prazosin (MINIPRESS) 2 MG capsule Take 1 capsule (2 mg total) by mouth at bedtime. 01/12/18  Yes McNew, Tyson Babinski, MD  pregabalin (LYRICA) 50 MG capsule Take 50 mg by mouth 2 (two) times daily. 07/12/21  Yes [provider]  SUMAtriptan (IMITREX) 20 MG/ACT nasal spray Place 20 mg into the nose as needed. 12/06/21  Yes [provider]  topiramate (TOPAMAX) 25 MG tablet Take 25 mg by mouth 2 (two) times daily. 12/06/21  Yes [provider]    Allergies: Allergies  Allergen Reactions   Betadine [Povidone Iodine] Other (See Comments)    Reaction:  Unknown    Fish Allergy Hives   Iodine Rash   Shellfish-Derived Products Other (See Comments)    Reaction:  Unknown     Social History:  reports that she quit smoking about 5 years ago. Her smoking use included cigarettes. She has a 1.50 pack-year smoking history. She has been exposed to tobacco smoke. She has never used smokeless tobacco. She  reports that she does not drink alcohol and does not use drugs.   Family History: Family History  Problem Relation Age of Onset   Depression Mother    Hypertension Mother    Sleep apnea Mother    Asthma Mother    COPD Mother    Diabetes Mother    Anxiety disorder Mother    Arthritis Mother    Breast cancer Maternal Aunt        20's   Stroke Maternal Grandmother     Review of Systems: Review of Systems  Constitutional:  Negative for chills and fever.  HENT:  Negative for hearing loss.   Respiratory:  Negative for shortness of breath.   Cardiovascular:  Negative for chest pain.  Gastrointestinal:  Positive for  abdominal pain, nausea and vomiting. Negative for constipation and diarrhea.  Genitourinary:  Negative for dysuria.  Musculoskeletal:  Negative for myalgias.  Skin:  Negative for rash.  Neurological:  Negative for dizziness.  Psychiatric/Behavioral:  Negative for depression.     Physical Exam BP 121/66   Pulse 70   Temp 98.7 F (37.1 C)   Ht '5\' 6"'$  (1.676 m)   Wt 287 lb (130.2 kg)   LMP 01/13/2022 (Exact Date)   SpO2 97%   BMI 46.32 kg/m  CONSTITUTIONAL: No acute distress HEENT:  Normocephalic, atraumatic, extraocular motion intact. NECK: Trachea is midline, and there is no jugular venous distension.  RESPIRATORY:  Lungs are clear, and breath sounds are equal bilaterally. Normal respiratory effort without pathologic use of accessory muscles. CARDIOVASCULAR: Heart is regular without murmurs, gallops, or rubs. GI: The abdomen is soft, obese, nondistended, with some tenderness to palpation in the right upper quadrant and less so in the epigastric area.  Negative Murphy sign.  He has a midline laparotomy scar that is well-healed.   MUSCULOSKELETAL:  Normal muscle strength and tone in all four extremities.  No peripheral edema or cyanosis. SKIN: Skin turgor is normal. There are no pathologic skin lesions.  NEUROLOGIC:  Motor and sensation is grossly normal.  Cranial nerves are grossly intact. PSYCH:  Alert and oriented to person, place and time. Affect is normal.  Laboratory Analysis: Labs from 01/15/2022: Sodium 141, potassium 3.9, chloride 113, CO2 22, BUN 10, creatinine 0.71.  WBC 7.6, hemoglobin 13.3, hematocrit 40.9, platelets 209.  Imaging: Ultrasound RUQ on 01/14/2022: IMPRESSION: Cholelithiasis without evidence to suggest acute cholecystitis or biliary obstruction by ultrasound.  Assessment and Plan: This is a 31 y.o. female with symptomatic cholelithiasis.  - Discussed with patient the findings on her ultrasound and showed the images to her so she could see the size of the  stones relative to a decompressed gallbladder.  Discussed with the patient how gallstones can form and that surgery is the mainstay of treatment in order to remove the source of the stones which is the gallbladder. -Discussed with the patient my plan for a robotic assisted cholecystectomy with ICG cholangiogram and reviewed the surgery at length with her including the incisions, risks of bleeding, infection, injury to surrounding structures, the use of ICG to evaluate the anatomy better, the potential for open cholecystectomy given her prior laparotomies, hospital stay versus outpatient surgery, postoperative activity restrictions and pain control, and she is willing to proceed. - We will tentatively try to schedule the patient for 02/03/2022, pending transportation issues for the patient.  If she is able to schedule transportation that day then we will proceed with 02/03/2022.  Otherwise we will have to book her for  10//23.  Patient understands this and all of her questions have been answered.  I spent 55 minutes dedicated to the care of this patient on the date of this encounter to include pre-visit review of records, face-to-face time with the patient discussing diagnosis and management, and any post-visit coordination of care.   Melvyn Neth, [facility]

## 2022-01-31 NOTE — Telephone Encounter (Signed)
Patient has been advised of Pre-Admission date/time, and Surgery date at [facility].  Surgery Date: 02/16/22 Preadmission Testing Date: 02/07/22 (phone 8a-1p)  Patient has been made aware to call [phone], between 1-3:00pm the day before surgery, to find out what time to arrive for surgery.

## 2022-01-31 NOTE — Patient Instructions (Signed)
You have requested to have your gallbladder removed. This will be done at [facility] with Dr. Hampton Abbot.  You will most likely be out of work 1-2 weeks for this surgery.  If you have FMLA or disability paperwork that needs filled out you may drop this off at our office or this can be faxed to [phone].  You will return after your post-op appointment with a lifting restriction for approximately 4 more weeks.  You will be able to eat anything you would like to following surgery. But, start by eating a bland diet and advance this as tolerated. The Gallbladder diet is below, please go as closely by this diet as possible prior to surgery to avoid any further attacks.  Please see the (blue)pre-care form that you have been given today. Our surgery scheduler will call you to verify surgery date and to go over information.   If you have any questions, please call our office.  Laparoscopic Cholecystectomy Laparoscopic cholecystectomy is surgery to remove the gallbladder. The gallbladder is located in the upper right part of the abdomen, behind the liver. It is a storage sac for bile, which is produced in the liver. Bile aids in the digestion and absorption of fats. Cholecystectomy is often done for inflammation of the gallbladder (cholecystitis). This condition is usually caused by a buildup of gallstones (cholelithiasis) in the gallbladder. Gallstones can block the flow of bile, and that can result in inflammation and pain. In severe cases, emergency surgery may be required. If emergency surgery is not required, you will have time to prepare for the procedure. Laparoscopic surgery is an alternative to open surgery. Laparoscopic surgery has a shorter recovery time. Your common bile duct may also need to be examined during the procedure. If stones are found in the common bile duct, they may be removed. LET [facility] CARE PROVIDER KNOW ABOUT: Any allergies you have. All medicines you are taking,  including vitamins, herbs, eye drops, creams, and over-the-counter medicines. Previous problems you or members of your family have had with the use of anesthetics. Any blood disorders you have. Previous surgeries you have had.  Any medical conditions you have. RISKS AND COMPLICATIONS Generally, this is a safe procedure. However, problems may occur, including: Infection. Bleeding. Allergic reactions to medicines. Damage to other structures or organs. A stone remaining in the common bile duct. A bile leak from the cyst duct that is clipped when your gallbladder is removed. The need to convert to open surgery, which requires a larger incision in the abdomen. This may be necessary if your surgeon thinks that it is not safe to continue with a laparoscopic procedure. BEFORE THE PROCEDURE Ask your health care provider about: Changing or stopping your regular medicines. This is especially important if you are taking diabetes medicines or blood thinners. Taking medicines such as aspirin and ibuprofen. These medicines can thin your blood. Do not take these medicines before your procedure if your health care provider instructs you not to. Follow instructions from your health care provider about eating or drinking restrictions. Let your health care provider know if you develop a cold or an infection before surgery. Plan to have someone take you home after the procedure. Ask your health care provider how your surgical site will be marked or identified. You may be given antibiotic medicine to help prevent infection. PROCEDURE To reduce your risk of infection: Your health care team will wash or sanitize their hands. Your skin will be washed with soap. An IV  tube may be inserted into one of your veins. You will be given a medicine to make you fall asleep (general anesthetic). A breathing tube will be placed in your mouth. The surgeon will make several small cuts (incisions) in your abdomen. A thin,  lighted tube (laparoscope) that has a tiny camera on the end will be inserted through one of the small incisions. The camera on the laparoscope will send a picture to a TV screen (monitor) in the operating room. This will give the surgeon a good view inside your abdomen. A gas will be pumped into your abdomen. This will expand your abdomen to give the surgeon more room to perform the surgery. Other tools that are needed for the procedure will be inserted through the other incisions. The gallbladder will be removed through one of the incisions. After your gallbladder has been removed, the incisions will be closed with stitches (sutures), staples, or skin glue. Your incisions may be covered with a bandage (dressing). The procedure may vary among health care providers and hospitals. AFTER THE PROCEDURE Your blood pressure, heart rate, breathing rate, and blood oxygen level will be monitored often until the medicines you were given have worn off. You will be given medicines as needed to control your pain.   This information is not intended to replace advice given to you by your health care provider. Make sure you discuss any questions you have with your health care provider.   Document Released: 05/02/2005 Document Revised: 01/21/2015 Document Reviewed: 12/12/2012 Elsevier Interactive Patient Education 2016 [place] Diet for Gallbladder Conditions A low-fat diet can be helpful if you have pancreatitis or a gallbladder condition. With these conditions, your pancreas and gallbladder have trouble digesting fats. A healthy eating plan with less fat will help rest your pancreas and gallbladder and reduce your symptoms. WHAT DO I NEED TO KNOW ABOUT THIS DIET? Eat a low-fat diet. Reduce your fat intake to less than 20-30% of your total daily calories. This is less than 50-60 g of fat per day. Remember that you need some fat in your diet. Ask your dietician what your daily goal should  be. Choose nonfat and low-fat healthy foods. Look for the words "nonfat," "low fat," or "fat free." As a guide, look on the label and choose foods with less than 3 g of fat per serving. Eat only one serving. Avoid alcohol. Do not smoke. If you need help quitting, talk with your health care provider. Eat small frequent meals instead of three large heavy meals. WHAT FOODS CAN I EAT? Grains Include healthy grains and starches such as potatoes, wheat bread, fiber-rich cereal, and brown rice. Choose whole grain options whenever possible. In adults, whole grains should account for 45-65% of your daily calories.  Fruits and Vegetables Eat plenty of fruits and vegetables. Fresh fruits and vegetables add fiber to your diet. Meats and Other Protein Sources Eat lean meat such as chicken and pork. Trim any fat off of meat before cooking it. Eggs, fish, and beans are other sources of protein. In adults, these foods should account for 10-35% of your daily calories. Dairy Choose low-fat milk and dairy options. Dairy includes fat and protein, as well as calcium.  Fats and Oils Limit high-fat foods such as fried foods, sweets, baked goods, sugary drinks.  Other Creamy sauces and condiments, such as mayonnaise, can add extra fat. Think about whether or not you need to use them, or use smaller amounts or low fat options.  WHAT FOODS ARE NOT RECOMMENDED? High fat foods, such as: [employer]. Ice cream. [place] toast. Sweet rolls. Pizza. Cheese bread. Foods covered with batter, butter, creamy sauces, or cheese. Fried foods. Sugary drinks and desserts. Foods that cause gas or bloating   This information is not intended to replace advice given to you by your health care provider. Make sure you discuss any questions you have with your health care provider.   Document Released: 05/07/2013 Document Reviewed: 05/07/2013 Elsevier Interactive Patient Education [employer].

## 2022-02-07 ENCOUNTER — Encounter
Admission: RE | Admit: 2022-02-07 | Discharge: 2022-02-07 | Disposition: A | Discharge status: Home / Self Care | Payer: Medicaid Other | Source: Ambulatory Visit | Attending: Surgery | Admitting: Surgery

## 2022-02-07 VITALS — Ht 68.0 in | Wt 296.0 lb

## 2022-02-07 DIAGNOSIS — Z01818 Encounter for other preprocedural examination: Secondary | ICD-10-CM

## 2022-02-07 HISTORY — DX: Hypoglycemia, unspecified: E16.2

## 2022-02-07 NOTE — Patient Instructions (Signed)
Your procedure is scheduled on: 02/16/22 Report to [facility]. To find out your arrival time please call [phone] between 1PM - 3PM on 02/15/22.  Remember: Instructions that are not followed completely may result in serious medical risk, up to and including death, or upon the discretion of your surgeon and anesthesiologist your surgery may need to be rescheduled.     _X__ 1. Do not eat food after midnight the night before your procedure.                 No gum chewing or hard candies. You may drink clear liquids up to 2 hours                 before you are scheduled to arrive for your surgery- DO not drink clear                 liquids within 2 hours of the start of your surgery.                 Clear Liquids include:  water, apple juice without pulp, clear carbohydrate                 drink such as Clearfast or Gatorade, Black Coffee or Tea (Do not add                 anything to coffee or tea). Diabetics water only  __X__2.  On the morning of surgery brush your teeth with toothpaste and water, you                 may rinse your mouth with mouthwash if you wish.  Do not swallow any              toothpaste of mouthwash.     _X__ 3.  No Alcohol for 24 hours before or after surgery.   _X__ 4.  Do Not Smoke or use e-cigarettes For 24 Hours Prior to Your Surgery.                 Do not use any chewable tobacco products for at least 6 hours prior to                 surgery.  ____  5.  Bring all medications with you on the day of surgery if instructed.   __X__  6.  Notify your doctor if there is any change in your medical condition      (cold, fever, infections).     Do not wear jewelry, make-up, hairpins, clips or nail polish. Do not wear lotions, powders, or perfumes. You may wear deodorant Do not shave body hair 48 hours prior to surgery. Men may shave face and neck. Do not bring valuables to the hospital.    [facility] is not  responsible for any belongings or valuables.  Contacts, dentures/partials or body piercings may not be worn into surgery. Bring a case for your contacts, glasses or hearing aids, a denture cup will be supplied. Leave your suitcase in the car. After surgery it may be brought to your room. For patients admitted to the hospital, discharge time is determined by your treatment team.   Patients discharged the day of surgery will not be allowed to drive home.    __X__ Take these medicines the morning of surgery with A SIP OF WATER:    1. ARIPiprazole (ABILIFY) 5 MG tablet  2. escitalopram (  LEXAPRO) 10 MG tablet  3. levothyroxine (SYNTHROID) 75 MCG tablet  4. lithium carbonate 300 MG capsule  5. omeprazole (PRILOSEC) 20 MG capsule  6. pregabalin (LYRICA) 50 MG capsule  7. topiramate (TOPAMAX) 25 MG tablet  ____ Fleet Enema (as directed)   ____ Use CHG Soap/SAGE wipes as directed  ____ Use inhalers on the day of surgery  ____ Stop metformin/Janumet/Farxiga 2 days prior to surgery    ____ Take 1/2 of usual insulin dose the night before surgery. No insulin the morning          of surgery.   ____ Stop Blood Thinners Coumadin/Plavix/Xarelto/Pleta/Pradaxa/Eliquis/Effient/Aspirin  on   Or contact your Surgeon, Cardiologist or Medical Doctor regarding  ability to stop your blood thinners  __X__ Stop Anti-inflammatories 7 days before surgery such as Advil, Ibuprofen, Motrin,  BC or Goodies Powder, Naprosyn, Naproxen, Aleve, Aspirin    __X__ Stop all herbals and supplements, fish oil or vitamins  until after surgery.    ____ Bring C-Pap to the hospital.

## 2022-02-13 HISTORY — PX: CHOLECYSTECTOMY: SHX55

## 2022-02-16 ENCOUNTER — Ambulatory Visit: Payer: Medicaid Other | Admitting: Certified Registered"

## 2022-02-16 ENCOUNTER — Other Ambulatory Visit: Payer: Self-pay

## 2022-02-16 ENCOUNTER — Encounter
Admission: RE | Disposition: A | Discharge status: Home / Self Care | Payer: Self-pay | Source: Home / Self Care | Attending: Surgery

## 2022-02-16 ENCOUNTER — Encounter: Payer: Self-pay | Admitting: Surgery

## 2022-02-16 ENCOUNTER — Ambulatory Visit
Admission: RE | Admit: 2022-02-16 | Discharge: 2022-02-16 | Disposition: A | Discharge status: Home / Self Care | Payer: Medicaid Other | Attending: Surgery | Admitting: Surgery

## 2022-02-16 DIAGNOSIS — K219 Gastro-esophageal reflux disease without esophagitis: Secondary | ICD-10-CM | POA: Insufficient documentation

## 2022-02-16 DIAGNOSIS — E785 Hyperlipidemia, unspecified: Secondary | ICD-10-CM | POA: Diagnosis not present

## 2022-02-16 DIAGNOSIS — Z87891 Personal history of nicotine dependence: Secondary | ICD-10-CM | POA: Insufficient documentation

## 2022-02-16 DIAGNOSIS — K660 Peritoneal adhesions (postprocedural) (postinfection): Secondary | ICD-10-CM

## 2022-02-16 DIAGNOSIS — K8010 Calculus of gallbladder with chronic cholecystitis without obstruction: Secondary | ICD-10-CM | POA: Diagnosis not present

## 2022-02-16 DIAGNOSIS — J45909 Unspecified asthma, uncomplicated: Secondary | ICD-10-CM | POA: Insufficient documentation

## 2022-02-16 DIAGNOSIS — K8020 Calculus of gallbladder without cholecystitis without obstruction: Secondary | ICD-10-CM

## 2022-02-16 DIAGNOSIS — I10 Essential (primary) hypertension: Secondary | ICD-10-CM | POA: Diagnosis not present

## 2022-02-16 DIAGNOSIS — E039 Hypothyroidism, unspecified: Secondary | ICD-10-CM | POA: Diagnosis not present

## 2022-02-16 DIAGNOSIS — Z01818 Encounter for other preprocedural examination: Secondary | ICD-10-CM

## 2022-02-16 HISTORY — DX: Peritoneal adhesions (postprocedural) (postinfection): K66.0

## 2022-02-16 HISTORY — PX: LAPAROSCOPIC LYSIS OF ADHESIONS: SHX5905

## 2022-02-16 LAB — POCT PREGNANCY, URINE: Preg Test, Ur: NEGATIVE

## 2022-02-16 SURGERY — CHOLECYSTECTOMY, ROBOT-ASSISTED, LAPAROSCOPIC
Anesthesia: General | Site: Abdomen

## 2022-02-16 MED ORDER — DEXMEDETOMIDINE HCL IN NACL 200 MCG/50ML IV SOLN
INTRAVENOUS | Status: DC | PRN
  Administered 2022-02-16: 8 ug via INTRAVENOUS
  Administered 2022-02-16: 12 ug via INTRAVENOUS

## 2022-02-16 MED ORDER — CEFAZOLIN IN SODIUM CHLORIDE 3-0.9 GM/100ML-% IV SOLN
3.0000 g | INTRAVENOUS | Status: AC
  Administered 2022-02-16: 3 g via INTRAVENOUS
  Filled 2022-02-16 (×2): qty 100

## 2022-02-16 MED ORDER — INDOCYANINE GREEN 25 MG IV SOLR
2.5000 mg | INTRAVENOUS | Status: AC
  Administered 2022-02-16: 2.5 mg via INTRAVENOUS
  Filled 2022-02-16: qty 1

## 2022-02-16 MED ORDER — FENTANYL CITRATE (PF) 100 MCG/2ML IJ SOLN
INTRAMUSCULAR | Status: AC
  Filled 2022-02-16: qty 2

## 2022-02-16 MED ORDER — HYDROMORPHONE HCL 1 MG/ML IJ SOLN
INTRAMUSCULAR | Status: AC
  Filled 2022-02-16: qty 1

## 2022-02-16 MED ORDER — PROPOFOL 10 MG/ML IV BOLUS
INTRAVENOUS | Status: DC | PRN
  Administered 2022-02-16: 200 mg via INTRAVENOUS

## 2022-02-16 MED ORDER — DEXAMETHASONE SODIUM PHOSPHATE 10 MG/ML IJ SOLN
INTRAMUSCULAR | Status: DC | PRN
  Administered 2022-02-16: 10 mg via INTRAVENOUS

## 2022-02-16 MED ORDER — FENTANYL CITRATE (PF) 100 MCG/2ML IJ SOLN: 25.0000 ug | INTRAMUSCULAR | Status: DC | PRN

## 2022-02-16 MED ORDER — FENTANYL CITRATE (PF) 100 MCG/2ML IJ SOLN
INTRAMUSCULAR | Status: DC | PRN
  Administered 2022-02-16: 50 ug via INTRAVENOUS

## 2022-02-16 MED ORDER — BUPIVACAINE-EPINEPHRINE (PF) 0.25% -1:200000 IJ SOLN
INTRAMUSCULAR | Status: DC | PRN
  Administered 2022-02-16: 40 mL via INTRAMUSCULAR

## 2022-02-16 MED ORDER — OXYCODONE HCL 5 MG PO TABS: 5.0000 mg | ORAL_TABLET | ORAL | 0 refills | Status: DC | PRN

## 2022-02-16 MED ORDER — MIDAZOLAM HCL 2 MG/2ML IJ SOLN
INTRAMUSCULAR | Status: DC | PRN
  Administered 2022-02-16: 2 mg via INTRAVENOUS

## 2022-02-16 MED ORDER — OXYCODONE HCL 5 MG PO TABS
ORAL_TABLET | ORAL | Status: AC
  Administered 2022-02-16: 5 mg via ORAL
  Filled 2022-02-16: qty 1

## 2022-02-16 MED ORDER — SUGAMMADEX SODIUM 200 MG/2ML IV SOLN
INTRAVENOUS | Status: DC | PRN
  Administered 2022-02-16: 200 mg via INTRAVENOUS

## 2022-02-16 MED ORDER — LACTATED RINGERS IV SOLN: INTRAVENOUS | Status: DC

## 2022-02-16 MED ORDER — CHLORHEXIDINE GLUCONATE 0.12 % MT SOLN: 15.0000 mL | Freq: Once | OROMUCOSAL | Status: AC

## 2022-02-16 MED ORDER — BUPIVACAINE-EPINEPHRINE (PF) 0.25% -1:200000 IJ SOLN
INTRAMUSCULAR | Status: AC
  Filled 2022-02-16: qty 30

## 2022-02-16 MED ORDER — ACETAMINOPHEN 500 MG PO TABS: 1000.0000 mg | ORAL_TABLET | Freq: Four times a day (QID) | ORAL | Status: DC | PRN

## 2022-02-16 MED ORDER — IBUPROFEN 600 MG PO TABS: 600.0000 mg | ORAL_TABLET | Freq: Three times a day (TID) | ORAL | 1 refills | Status: DC | PRN

## 2022-02-16 MED ORDER — FENTANYL CITRATE (PF) 100 MCG/2ML IJ SOLN
INTRAMUSCULAR | Status: AC
  Administered 2022-02-16: 25 ug via INTRAVENOUS
  Filled 2022-02-16: qty 2

## 2022-02-16 MED ORDER — ORAL CARE MOUTH RINSE: 15.0000 mL | Freq: Once | OROMUCOSAL | Status: AC

## 2022-02-16 MED ORDER — ACETAMINOPHEN 500 MG PO TABS: 1000.0000 mg | ORAL_TABLET | ORAL | Status: AC

## 2022-02-16 MED ORDER — 0.9 % SODIUM CHLORIDE (POUR BTL) OPTIME
TOPICAL | Status: DC | PRN
  Administered 2022-02-16: 500 mL

## 2022-02-16 MED ORDER — CHLORHEXIDINE GLUCONATE 0.12 % MT SOLN
OROMUCOSAL | Status: AC
  Administered 2022-02-16: 15 mL via OROMUCOSAL
  Filled 2022-02-16: qty 15

## 2022-02-16 MED ORDER — ROCURONIUM BROMIDE 100 MG/10ML IV SOLN
INTRAVENOUS | Status: DC | PRN
  Administered 2022-02-16: 20 mg via INTRAVENOUS
  Administered 2022-02-16: 60 mg via INTRAVENOUS
  Administered 2022-02-16: 40 mg via INTRAVENOUS

## 2022-02-16 MED ORDER — ONDANSETRON HCL 4 MG/2ML IJ SOLN
INTRAMUSCULAR | Status: DC | PRN
  Administered 2022-02-16: 4 mg via INTRAVENOUS

## 2022-02-16 MED ORDER — GABAPENTIN 300 MG PO CAPS
ORAL_CAPSULE | ORAL | Status: AC
  Administered 2022-02-16: 300 mg via ORAL
  Filled 2022-02-16: qty 1

## 2022-02-16 MED ORDER — HYDROMORPHONE HCL 1 MG/ML IJ SOLN
INTRAMUSCULAR | Status: DC | PRN
  Administered 2022-02-16: 1 mg via INTRAVENOUS

## 2022-02-16 MED ORDER — CHLORHEXIDINE GLUCONATE CLOTH 2 % EX PADS: 6.0000 | MEDICATED_PAD | Freq: Once | CUTANEOUS | Status: DC

## 2022-02-16 MED ORDER — OXYCODONE HCL 5 MG/5ML PO SOLN: 5.0000 mg | Freq: Once | ORAL | Status: AC | PRN

## 2022-02-16 MED ORDER — LIDOCAINE HCL (CARDIAC) PF 100 MG/5ML IV SOSY
PREFILLED_SYRINGE | INTRAVENOUS | Status: DC | PRN
  Administered 2022-02-16: 50 mg via INTRAVENOUS

## 2022-02-16 MED ORDER — BUPIVACAINE LIPOSOME 1.3 % IJ SUSP
INTRAMUSCULAR | Status: AC
  Filled 2022-02-16: qty 10

## 2022-02-16 MED ORDER — MIDAZOLAM HCL 2 MG/2ML IJ SOLN
INTRAMUSCULAR | Status: AC
  Filled 2022-02-16: qty 2

## 2022-02-16 MED ORDER — ACETAMINOPHEN 500 MG PO TABS
ORAL_TABLET | ORAL | Status: AC
  Administered 2022-02-16: 1000 mg via ORAL
  Filled 2022-02-16: qty 2

## 2022-02-16 MED ORDER — BUPIVACAINE LIPOSOME 1.3 % IJ SUSP: 20.0000 mL | Freq: Once | INTRAMUSCULAR | Status: DC

## 2022-02-16 MED ORDER — GABAPENTIN 300 MG PO CAPS: 300.0000 mg | ORAL_CAPSULE | ORAL | Status: AC

## 2022-02-16 MED ORDER — OXYCODONE HCL 5 MG PO TABS: 5.0000 mg | ORAL_TABLET | Freq: Once | ORAL | Status: AC | PRN

## 2022-02-16 SURGICAL SUPPLY — 57 items
ADH SKN CLS APL DERMABOND .7 (GAUZE/BANDAGES/DRESSINGS) ×3
BAG PRESSURE INF REUSE 1000 (BAG) IMPLANT
CANNULA REDUC XI 12-8 STAPL (CANNULA) ×3
CANNULA REDUCER 12-8 DVNC XI (CANNULA) ×3 IMPLANT
CLIP LIGATING HEMO O LOK GREEN (MISCELLANEOUS) ×3 IMPLANT
COVER TIP SHEARS 8 DVNC (MISCELLANEOUS) IMPLANT
COVER TIP SHEARS 8MM DA VINCI (MISCELLANEOUS) ×3
CUP MEDICINE 2OZ PLAST GRAD ST (MISCELLANEOUS) ×3 IMPLANT
DERMABOND ADVANCED .7 DNX12 (GAUZE/BANDAGES/DRESSINGS) ×3 IMPLANT
DRAPE ARM DVNC X/XI (DISPOSABLE) ×12 IMPLANT
DRAPE COLUMN DVNC XI (DISPOSABLE) ×3 IMPLANT
DRAPE DA VINCI XI ARM (DISPOSABLE) ×12
DRAPE DA VINCI XI COLUMN (DISPOSABLE) ×3
ELECT CAUTERY BLADE TIP 2.5 (TIP) ×3
ELECT REM PT RETURN 9FT ADLT (ELECTROSURGICAL) ×3
ELECTRODE CAUTERY BLDE TIP 2.5 (TIP) ×3 IMPLANT
ELECTRODE REM PT RTRN 9FT ADLT (ELECTROSURGICAL) ×3 IMPLANT
GLOVE SURG SYN 7.0 (GLOVE) ×9 IMPLANT
GLOVE SURG SYN 7.0 PF PI (GLOVE) ×6 IMPLANT
GLOVE SURG SYN 7.5  E (GLOVE) ×9
GLOVE SURG SYN 7.5 E (GLOVE) ×9 IMPLANT
GLOVE SURG SYN 7.5 PF PI (GLOVE) ×6 IMPLANT
GOWN STRL REUS W/ TWL LRG LVL3 (GOWN DISPOSABLE) ×12 IMPLANT
GOWN STRL REUS W/TWL LRG LVL3 (GOWN DISPOSABLE) ×12
GRASPER SUT TROCAR 14GX15 (MISCELLANEOUS) IMPLANT
IRRIGATOR SUCT 8 DISP DVNC XI (IRRIGATION / IRRIGATOR) IMPLANT
IRRIGATOR SUCTION 8MM XI DISP (IRRIGATION / IRRIGATOR)
IV NS 1000ML (IV SOLUTION)
IV NS 1000ML BAXH (IV SOLUTION) IMPLANT
KIT PINK PAD W/HEAD ARE REST (MISCELLANEOUS) ×3
KIT PINK PAD W/HEAD ARM REST (MISCELLANEOUS) ×3 IMPLANT
LABEL OR SOLS (LABEL) ×3 IMPLANT
MANIFOLD NEPTUNE II (INSTRUMENTS) ×3 IMPLANT
NEEDLE HYPO 22GX1.5 SAFETY (NEEDLE) ×3 IMPLANT
NS IRRIG 500ML POUR BTL (IV SOLUTION) ×3 IMPLANT
OBTURATOR OPTICAL STANDARD 8MM (TROCAR) ×3
OBTURATOR OPTICAL STND 8 DVNC (TROCAR) ×3
OBTURATOR OPTICALSTD 8 DVNC (TROCAR) ×3 IMPLANT
PACK LAP CHOLECYSTECTOMY (MISCELLANEOUS) ×3 IMPLANT
SEAL CANN UNIV 5-8 DVNC XI (MISCELLANEOUS) ×9 IMPLANT
SEAL XI 5MM-8MM UNIVERSAL (MISCELLANEOUS) ×9
SET TUBE SMOKE EVAC HIGH FLOW (TUBING) ×3 IMPLANT
SOLUTION ELECTROLUBE (MISCELLANEOUS) ×3 IMPLANT
SPIKE FLUID TRANSFER (MISCELLANEOUS) ×3 IMPLANT
SPONGE T-LAP 18X18 ~~LOC~~+RFID (SPONGE) IMPLANT
SPONGE T-LAP 4X18 ~~LOC~~+RFID (SPONGE) ×3 IMPLANT
STAPLER CANNULA SEAL DVNC XI (STAPLE) ×3 IMPLANT
STAPLER CANNULA SEAL XI (STAPLE) ×3
SUT MNCRL AB 4-0 PS2 18 (SUTURE) ×3 IMPLANT
SUT VIC AB 3-0 SH 27 (SUTURE)
SUT VIC AB 3-0 SH 27X BRD (SUTURE) IMPLANT
SUT VICRYL 0 AB UR-6 (SUTURE) ×6 IMPLANT
SYS BAG RETRIEVAL 10MM (BASKET) ×3
SYSTEM BAG RETRIEVAL 10MM (BASKET) ×3 IMPLANT
TAPE TRANSPORE STRL 2 31045 (GAUZE/BANDAGES/DRESSINGS) ×3 IMPLANT
TRAP FLUID SMOKE EVACUATOR (MISCELLANEOUS) ×3 IMPLANT
WATER STERILE IRR 500ML POUR (IV SOLUTION) ×3 IMPLANT

## 2022-02-16 NOTE — Discharge Instructions (Addendum)
AMBULATORY SURGERY  DISCHARGE INSTRUCTIONS   The drugs that you were given will stay in your system until tomorrow so for the next 24 hours you should not:  Drive an automobile Make any legal decisions Drink any alcoholic beverage   You may resume regular meals tomorrow.  Today it is better to start with liquids and gradually work up to solid foods.  You may eat anything you prefer, but it is better to start with liquids, then soup and crackers, and gradually work up to solid foods.   Please notify your doctor immediately if you have any unusual bleeding, trouble breathing, redness and pain at the surgery site, drainage, fever, or pain not relieved by medication.                    

## 2022-02-16 NOTE — Interval H&P Note (Signed)
History and Physical Interval Note:  02/16/2022 1:01 PM  Melanie Bell  has presented today for surgery, with the diagnosis of Symptomatic cholelithiasis.  The various methods of treatment have been discussed with the patient and family. After consideration of risks, benefits and other options for treatment, the patient has consented to  Procedure(s): XI ROBOTIC ASSISTED LAPAROSCOPIC CHOLECYSTECTOMY (N/A) [place] (ICG) (N/A) as a surgical intervention.  The patient's history has been reviewed, patient examined, no change in status, stable for surgery.  I have reviewed the patient's chart and labs.  Questions were answered to the patient's satisfaction.     Lynix Bonine

## 2022-02-16 NOTE — Anesthesia Preprocedure Evaluation (Signed)
Anesthesia Evaluation  Patient identified by MRN, date of birth, ID band Patient awake    Reviewed: Allergy & Precautions, NPO status , Patient's Chart, lab work & pertinent test results  History of Anesthesia Complications Negative for: history of anesthetic complications  Airway Mallampati: I  TM Distance: >3 FB Neck ROM: full    Dental  (+) Dental Advidsory Given, Teeth Intact   Pulmonary asthma , neg sleep apnea, neg recent URI, former smoker,    Pulmonary exam normal        Cardiovascular hypertension, (-) anginanegative cardio ROS Normal cardiovascular exam(-) dysrhythmias      Neuro/Psych PSYCHIATRIC DISORDERS negative neurological ROS     GI/Hepatic negative GI ROS, Neg liver ROS,   Endo/Other  Hypothyroidism   Renal/GU      Musculoskeletal   Abdominal   Peds  Hematology negative hematology ROS (+)   Anesthesia Other Findings Past Medical History: No date: Allergy No date: Anxiety No date: Arthritis No date: Asthma No date: Depression No date: Foreign body aspiration No date: GERD (gastroesophageal reflux disease) 09/30/2014: Hallucinations     Comment:  Sizoaffective No date: Hyperlipidemia No date: Hypertension No date: Hypoglycemia 04/21/2016: Intentional ingestion of batteries     Comment:   2 AAA batteries and 1 thumb tack; intent to hurt               herself/notes 04/21/2016 09/73/5329: Self-inflicted injury 92/42/6834: Suicidal ideation 10/2014: Tardive dyskinesia     Comment:  recent onset No date: Thyroid disease  Past Surgical History: No date: ABDOMINAL SURGERY     Comment:  "years ago" to remove foreign objects No date: APPENDECTOMY 09/03/2007: BREAST EXCISIONAL BIOPSY; Right     Comment:  surgical bx removed fibroadeoma No date: BREAST LUMPECTOMY; Right 09/10/2015: COLONOSCOPY WITH PROPOFOL; N/A     Comment:  Procedure: COLONOSCOPY WITH PROPOFOL;  Surgeon: [name], MD;  Location: [facility] ENDOSCOPY;  Service:               Endoscopy;  Laterality: N/A; 11/28/2014: ESOPHAGOGASTRODUODENOSCOPY; N/A     Comment:  Procedure: ESOPHAGOGASTRODUODENOSCOPY (EGD);  Surgeon:               [name], MD;  Location: [facility] ENDOSCOPY;                Service: Endoscopy;  Laterality: N/A; 02/21/2016: ESOPHAGOGASTRODUODENOSCOPY; N/A     Comment:  Procedure: ESOPHAGOGASTRODUODENOSCOPY (EGD);  Surgeon:               Mauri Pole, MD;  Location: [facility] ENDOSCOPY;                Service: Endoscopy;  Laterality: N/A; 04/21/2016: ESOPHAGOGASTRODUODENOSCOPY; N/A     Comment:  Procedure: ESOPHAGOGASTRODUODENOSCOPY (EGD);  Surgeon:               Gatha Mayer, MD;  Location: [facility] ENDOSCOPY;  Service:               Endoscopy;  Laterality: N/A; 05/06/2016: ESOPHAGOGASTRODUODENOSCOPY; N/A     Comment:  Procedure: ESOPHAGOGASTRODUODENOSCOPY (EGD);  Surgeon:               Jonathon Bellows, MD;  Location: [facility] ENDOSCOPY;  Service:               Gastroenterology;  Laterality: N/A; 06/13/2016: ESOPHAGOGASTRODUODENOSCOPY; N/A     Comment:  Procedure: ESOPHAGOGASTRODUODENOSCOPY (EGD);  Surgeon:  [name], MD;  Location: [facility] ENDOSCOPY;  Service:               Endoscopy;  Laterality: N/A; 01/14/2022: ESOPHAGOGASTRODUODENOSCOPY; N/A     Comment:  Procedure: ESOPHAGOGASTRODUODENOSCOPY (EGD);  Surgeon:               Toledo, Benay Pike, MD;  Location: [facility] ENDOSCOPY;                Service: Gastroenterology;  Laterality: N/A; 02/29/2016: ESOPHAGOGASTRODUODENOSCOPY (EGD) WITH PROPOFOL; N/A     Comment:  Procedure: ESOPHAGOGASTRODUODENOSCOPY (EGD) WITH               PROPOFOL;  Surgeon: [name], MD;  Location: [facility]               ENDOSCOPY;  Service: Endoscopy;  Laterality: N/A; 01/15/2018: ESOPHAGOGASTRODUODENOSCOPY (EGD) WITH PROPOFOL; N/A     Comment:  Procedure: ESOPHAGOGASTRODUODENOSCOPY (EGD) WITH               PROPOFOL;  Surgeon: Daneil Dolin, MD;   Location: [facility]               ENDO SUITE;  Service: Endoscopy;  Laterality: N/A;                retrieval forein body 09/12/2015: LAPAROTOMY; N/A     Comment:  Procedure: EXPLORATORY LAPAROTOMY;  Surgeon: Florene Glen, MD;  Location: [facility] ORS;  Service: General;                Laterality: N/A; 09/12/2015: Lonell Face; N/A     Comment:  Procedure: SIGMOIDOSCOPY;  Surgeon: Florene Glen,               MD;  Location: [facility] ORS;  Service: General;  Laterality:               N/A; No date: WISDOM TOOTH EXTRACTION     Reproductive/Obstetrics negative OB ROS                             Anesthesia Physical Anesthesia Plan  ASA: 3  Anesthesia Plan: General ETT   Post-op Pain Management:    Induction: Intravenous  PONV Risk Score and Plan: 4 or greater and Ondansetron, Dexamethasone and Midazolam  Airway Management Planned: Oral ETT  Additional Equipment:   Intra-op Plan:   Post-operative Plan: Extubation in OR  Informed Consent: I have reviewed the patients History and Physical, chart, labs and discussed the procedure including the risks, benefits and alternatives for the proposed anesthesia with the patient or authorized representative who has indicated his/her understanding and acceptance.     Dental Advisory Given  Plan Discussed with: Anesthesiologist, CRNA and Surgeon  Anesthesia Plan Comments: (Patient consented for risks of anesthesia including but not limited to:  - adverse reactions to medications - damage to eyes, teeth, lips or other oral mucosa - nerve damage due to positioning  - sore throat or hoarseness - Damage to heart, brain, nerves, lungs, other parts of body or loss of life  Patient voiced understanding.)        Anesthesia Quick Evaluation

## 2022-02-16 NOTE — Anesthesia Postprocedure Evaluation (Signed)
Anesthesia Post Note  Patient: Melanie Bell  Procedure(s) Performed: XI ROBOTIC ASSISTED LAPAROSCOPIC CHOLECYSTECTOMY (Abdomen) INDOCYANINE GREEN FLUORESCENCE IMAGING (ICG) LAPAROSCOPIC LYSIS OF ADHESIONS (Abdomen)  Patient location during evaluation: PACU Anesthesia Type: General Level of consciousness: awake and alert Pain management: pain level controlled Vital Signs Assessment: post-procedure vital signs reviewed and stable Respiratory status: spontaneous breathing, nonlabored ventilation and respiratory function stable Cardiovascular status: blood pressure returned to baseline and stable Postop Assessment: no apparent nausea or vomiting Anesthetic complications: no   No notable events documented.   Last Vitals:  Vitals:   02/16/22 1700 02/16/22 1713  BP: 118/65 123/65  Pulse: 88 77  Resp: 16 16  Temp: (!) 36.1 C   SpO2: 100% 98%    Last Pain:  Vitals:   02/16/22 1713  TempSrc: Temporal  PainSc: 0-No pain                 [place] Ouch

## 2022-02-16 NOTE — Op Note (Addendum)
Procedure Date:  02/16/2022  Pre-operative Diagnosis:  Symptomatic cholelithiasis  Post-operative Diagnosis: Symptomatic cholelithiasis; post-operative adhesions of colon to anterior abdominal wall  Procedure:  Robotic assisted cholecystectomy with ICG FireFly cholangiogram; extensive lysis of adhesion of > 90 min.  Surgeon:  Melvyn Neth, MD  Anesthesia:  General endotracheal  Estimated Blood Loss:  30 ml  Specimens:  gallbladder  Complications:  None  Indications for Procedure:  This is a 31 y.o. female who presents with abdominal pain and workup revealing symptomatic cholelithiasis.  The benefits, complications, treatment options, and expected outcomes were discussed with the patient. The risks of bleeding, infection, recurrence of symptoms, failure to resolve symptoms, bile duct damage, bile duct leak, retained common bile duct stone, bowel injury, and need for further procedures were all discussed with the patient and she was willing to proceed.  Description of Procedure: The patient was correctly identified in the preoperative area and brought into the operating room.  The patient was placed supine with VTE prophylaxis in place.  Appropriate time-outs were performed.  Anesthesia was induced and the patient was intubated.  Appropriate antibiotics were infused.  The abdomen was prepped and draped in a sterile fashion. The patient had 2 prior exploratory laparotomies.  A small incision was made in the LUQ and Veress needle place for insufflation, with appropriate initial pressures.  Then, using optiview technique, an 8 mm robotic port was placed in the left lateral abdomen under direct visualization.  No injury occurred.  Then, a left medial port, right medial port, and a right lateral port were placed.  The left lateral port was upsized to a 12 mm port.  The patient had extensive adhesions from the transverse colon to the midline, preventing visualization of the gallbladder.  The  right medial port was placed inferiorly in order to avoid the colon. The DaVinci platform was docked, camera targeted, and instruments were placed under direct visualization.  We then started with lysis of adhesions.  The camera and instruments had to be occasionally rotated to other arms in order to get the better angles and visualization.  The scar tissue was dense, needing combination of blunt, sharp, and cautery dissection.  This took more than 90 minutes to carefully free up the colon from the abdominal wall.  No injuries occurred.  Finally, the gallbladder was identified.  The fundus was grasped and retracted cephalad.  Adhesions from the gallbladder to the duodenum and stomach were lysed bluntly and with electrocautery. The infundibulum was grasped and retracted laterally, exposing the peritoneum overlying the gallbladder.  This was incised with electrocautery and extended on either side of the gallbladder.  FireFly cholangiogram was then obtained, and we were able to clearly identify the cystic duct and common bile duct.  The cystic duct and cystic artery were carefully dissected with combination of cautery and blunt dissection.  Both were clipped twice proximally and once distally, cutting in between.  The gallbladder was taken from the gallbladder fossa in a retrograde fashion with electrocautery. The gallbladder was placed in an Endocatch bag. The liver bed was inspected and any bleeding was controlled with electrocautery. The right upper quadrant was then inspected again revealing intact clips, no bleeding, and no ductal injury.  The 12 mm port was removed and endocatch bag retrieved.  The fascia was closed using PMI and 0 Vicryl under direct visualization.  Then, the remaining 8 mm ports were removed.   Local anesthetic was infused in all incisions and the incisions were closed  with 3-0 Vicryl and 4-0 Monocryl.  The wounds were cleaned and sealed with DermaBond.  The patient was emerged from  anesthesia and extubated and brought to the recovery room for further management.  The patient tolerated the procedure well and all counts were correct at the end of the case.   Melvyn Neth, MD

## 2022-02-16 NOTE — Transfer of Care (Signed)
Immediate Anesthesia Transfer of Care Note  Patient: Kaylynn Chamblin  Procedure(s) Performed: XI ROBOTIC ASSISTED LAPAROSCOPIC CHOLECYSTECTOMY (Abdomen) INDOCYANINE GREEN FLUORESCENCE IMAGING (ICG) LAPAROSCOPIC LYSIS OF ADHESIONS (Abdomen)  Patient Location: PACU  Anesthesia Type:General  Level of Consciousness: drowsy  Airway & Oxygen Therapy: Patient Spontanous Breathing and Patient connected to face mask oxygen  Post-op Assessment: Report given to RN and Post -op Vital signs reviewed and stable  Post vital signs: Reviewed and stable  Last Vitals:  Vitals Value Taken Time  BP 123/58 02/16/22 1610  Temp 36.4 C 02/16/22 1610  Pulse 89 02/16/22 1612  Resp 36 02/16/22 1612  SpO2 100 % 02/16/22 1612  Vitals shown include unvalidated device data.  Last Pain:  Vitals:   02/16/22 1144  TempSrc: Temporal  PainSc: 0-No pain         Complications: No notable events documented.

## 2022-02-17 ENCOUNTER — Encounter: Payer: Self-pay | Admitting: Surgery

## 2022-02-18 LAB — SURGICAL PATHOLOGY

## 2022-02-21 ENCOUNTER — Other Ambulatory Visit: Payer: Self-pay

## 2022-02-21 ENCOUNTER — Emergency Department
Admission: EM | Admit: 2022-02-21 | Discharge: 2022-02-21 | Disposition: A | Discharge status: Home / Self Care | Payer: Medicaid Other | Attending: Student in an Organized Health Care Education/Training Program | Admitting: Student in an Organized Health Care Education/Training Program

## 2022-02-21 ENCOUNTER — Emergency Department: Payer: Medicaid Other

## 2022-02-21 DIAGNOSIS — R109 Unspecified abdominal pain: Secondary | ICD-10-CM | POA: Diagnosis present

## 2022-02-21 DIAGNOSIS — Z79899 Other long term (current) drug therapy: Secondary | ICD-10-CM | POA: Diagnosis not present

## 2022-02-21 DIAGNOSIS — R1013 Epigastric pain: Secondary | ICD-10-CM | POA: Insufficient documentation

## 2022-02-21 DIAGNOSIS — G8918 Other acute postprocedural pain: Secondary | ICD-10-CM | POA: Diagnosis not present

## 2022-02-21 DIAGNOSIS — R1084 Generalized abdominal pain: Secondary | ICD-10-CM

## 2022-02-21 LAB — COMPREHENSIVE METABOLIC PANEL
ALT: 13 U/L (ref 0–44)
AST: 19 U/L (ref 15–41)
Albumin: 3.7 g/dL (ref 3.5–5.0)
Alkaline Phosphatase: 76 U/L (ref 38–126)
Anion gap: 3 — ABNORMAL LOW (ref 5–15)
BUN: 12 mg/dL (ref 6–20)
CO2: 25 mmol/L (ref 22–32)
Calcium: 9.4 mg/dL (ref 8.9–10.3)
Chloride: 112 mmol/L — ABNORMAL HIGH (ref 98–111)
Creatinine, Ser: 0.64 mg/dL (ref 0.44–1.00)
GFR, Estimated: >60 mL/min (ref >60)
Glucose, Bld: 107 mg/dL — ABNORMAL HIGH (ref 70–99)
Potassium: 4.2 mmol/L (ref 3.5–5.1)
Sodium: 140 mmol/L (ref 135–145)
Total Bilirubin: 0.7 mg/dL (ref 0.3–1.2)
Total Protein: 7.1 g/dL (ref 6.5–8.1)

## 2022-02-21 LAB — CBC WITH DIFFERENTIAL/PLATELET
Abs Immature Granulocytes: 0.03 10*3/uL (ref 0.00–0.07)
Basophils Absolute: 0 10*3/uL (ref 0.0–0.1)
Basophils Relative: 0 %
Eosinophils Absolute: 0.5 10*3/uL (ref 0.0–0.5)
Eosinophils Relative: 5 %
HCT: 39.4 % (ref 36.0–46.0)
Hemoglobin: 12.7 g/dL (ref 12.0–15.0)
Immature Granulocytes: 0 %
Lymphocytes Relative: 17 %
Lymphs Abs: 1.6 10*3/uL (ref 0.7–4.0)
MCH: 29.7 pg (ref 26.0–34.0)
MCHC: 32.2 g/dL (ref 30.0–36.0)
MCV: 92.3 fL (ref 80.0–100.0)
Monocytes Absolute: 0.5 10*3/uL (ref 0.1–1.0)
Monocytes Relative: 5 %
Neutro Abs: 6.5 10*3/uL (ref 1.7–7.7)
Neutrophils Relative %: 73 %
Platelets: 243 10*3/uL (ref 150–400)
RBC: 4.27 MIL/uL (ref 3.87–5.11)
RDW: 13.2 % (ref 11.5–15.5)
WBC: 9 10*3/uL (ref 4.0–10.5)
nRBC: 0 % (ref 0.0–0.2)

## 2022-02-21 LAB — URINALYSIS, ROUTINE W REFLEX MICROSCOPIC
Bilirubin Urine: NEGATIVE
Glucose, UA: NEGATIVE mg/dL
Hgb urine dipstick: NEGATIVE
Ketones, ur: 5 mg/dL — AB
Leukocytes,Ua: NEGATIVE
Nitrite: NEGATIVE
Protein, ur: 100 mg/dL — AB
Specific Gravity, Urine: 1.031 — ABNORMAL HIGH (ref 1.005–1.030)
pH: 7 (ref 5.0–8.0)

## 2022-02-21 LAB — LIPASE, BLOOD: Lipase: 32 U/L (ref 11–51)

## 2022-02-21 LAB — POC URINE PREG, ED: Preg Test, Ur: NEGATIVE

## 2022-02-21 MED ORDER — ONDANSETRON 4 MG PO TBDP
4.0000 mg | ORAL_TABLET | Freq: Once | ORAL | Status: AC
  Administered 2022-02-21: 4 mg via ORAL
  Filled 2022-02-21: qty 1

## 2022-02-21 MED ORDER — OXYCODONE-ACETAMINOPHEN 5-325 MG PO TABS: 1.0000 | ORAL_TABLET | Freq: Once | ORAL | Status: DC

## 2022-02-21 MED ORDER — OXYCODONE HCL 5 MG PO TABS
5.0000 mg | ORAL_TABLET | Freq: Once | ORAL | Status: AC
  Administered 2022-02-21: 5 mg via ORAL
  Filled 2022-02-21: qty 1

## 2022-02-21 NOTE — ED Provider Triage Note (Signed)
  Emergency Medicine Provider Triage Evaluation Note  Melanie Bell , a 31 y.o.female,  was evaluated in triage.  Pt complains of abdominal pain past few days.  She states that she had her gallbladder removed this past week.  Current endorsing significant abdominal pain diffusely across her entire abdomen associated with nausea.  No vomiting.  Denies any recent injuries.  Review of Systems  Positive: Abdominal pain, nausea Negative: Denies fever, chest pain, vomiting  Physical Exam  There were no vitals filed for this visit. Gen:   Awake, appears anxious and uncomfortable. Resp:  Normal effort  MSK:   Moves extremities without difficulty  Other:    Medical Decision Making  Given the patient's initial medical screening exam, the following diagnostic evaluation has been ordered. The patient will be placed in the appropriate treatment space, once one is available, to complete the evaluation and treatment. I have discussed the plan of care with the patient and I have advised the patient that an ED physician or mid-level practitioner will reevaluate their condition after the test results have been received, as the results may give them additional insight into the type of treatment they may need.    Diagnostics: Labs, UA, abdominal CT.   Treatments: none immediately   Teodoro Spray, [place] 02/21/22 1739

## 2022-02-21 NOTE — ED Provider Notes (Signed)
[facility] EMERGENCY DEPARTMENT Provider Note   CSN: [id number] Arrival date & time: 02/21/22  1705     History  Chief Complaint  Patient presents with   Abdominal Pain    Melanie Bell is a 31 y.o. female.  Presents to the emergency department for evaluation of abdominal pain and nausea.  She is status post laparoscopic cholecystectomy 5 days ago.  She had been doing well up until today she noticed some diffuse abdominal pain with nausea.  No vomiting.  No fevers chills or drainage from her incision sites.  She has some mild lower back pain.  She has not had a bowel movement.  She is passing gas.  HPI     Home Medications Prior to Admission medications   Medication Sig Start Date End Date Taking? Authorizing Provider  acetaminophen (TYLENOL) 500 MG tablet Take 2 tablets (1,000 mg total) by mouth every 6 (six) hours as needed for mild pain. 02/16/22   Olean Ree, MD  albuterol (VENTOLIN HFA) 108 (90 Base) MCG/ACT inhaler 2 puffs four times a day as needed 09/03/21   Glean Hess, MD  ARIPiprazole (ABILIFY) 5 MG tablet Take 5 mg by mouth daily. 12/03/21   [provider]  cabergoline (DOSTINEX) 0.5 MG tablet Take 1 tablet (0.5 mg total) by mouth 2 (two) times a week. 06/28/21   Shamleffer, Melanie Crazier, MD  escitalopram (LEXAPRO) 10 MG tablet Take 10 mg by mouth daily. 08/10/21   [provider]  fluticasone-salmeterol (ADVAIR HFA) 45-21 MCG/ACT inhaler Inhale 2 puffs into the lungs 2 (two) times daily. 09/03/21   Glean Hess, MD  hydrOXYzine (VISTARIL) 50 MG capsule Take 50 mg by mouth 2 (two) times daily as needed. 08/20/19   [provider]  ibuprofen (ADVIL) 600 MG tablet Take 1 tablet (600 mg total) by mouth every 8 (eight) hours as needed for moderate pain. 02/16/22   Olean Ree, MD  levothyroxine (SYNTHROID) 75 MCG tablet Take 1 tablet (75 mcg total) by mouth as directed. Two tablets on Sundays and Wednesday and  1 tablet rest of the week 11/18/21   Shamleffer, Melanie Crazier, MD  lithium carbonate 300 MG capsule Take 1 capsule (300 mg total) by mouth 2 (two) times daily with a meal. 12/11/17   [name], [name], MD  magnesium oxide (MAG-OX) 400 MG tablet Take 400 mg by mouth daily.    [provider]  meloxicam (MOBIC) 7.5 MG tablet Take 7.5 mg by mouth daily. 02/22/21   [provider]  mupirocin ointment (BACTROBAN) 2 % Apply 1 Application topically 2 (two) times daily. Patient not taking: Reported on 02/07/2022 01/24/22   Laurene Footman B, PA-C  omeprazole (PRILOSEC) 20 MG capsule TAKE 1 CAPSULE (20 MG TOTAL) BY MOUTH DAILY. 09/30/21   Glean Hess, MD  ondansetron (ZOFRAN) 4 MG tablet Take 1 tablet (4 mg total) by mouth every 6 (six) hours as needed for nausea. 01/15/22   Lorelei Pont, MD  oxyCODONE (OXY IR/ROXICODONE) 5 MG immediate release tablet Take 1-2 tablets (5-10 mg total) by mouth every 4 (four) hours as needed for severe pain. 02/16/22   Olean Ree, MD  prazosin (MINIPRESS) 2 MG capsule Take 1 capsule (2 mg total) by mouth at bedtime. 01/12/18   McNew, Tyson Babinski, MD  pregabalin (LYRICA) 50 MG capsule Take 50 mg by mouth 2 (two) times daily. 07/12/21   [provider]  SUMAtriptan (IMITREX) 20 MG/ACT nasal spray Place 20 mg  into the nose as needed. 12/06/21   [provider]  topiramate (TOPAMAX) 25 MG tablet Take 25 mg by mouth 2 (two) times daily. 12/06/21   [provider]      Allergies    Betadine [povidone iodine], Fish allergy, Iodine, and Shellfish-derived products    Review of Systems   Review of Systems  Physical Exam Updated Vital Signs BP (!) 111/57 (BP Location: Right Arm)   Pulse 64   Temp 98.4 F (36.9 C) (Oral)   Resp 18   LMP 01/13/2022 (Exact Date)   SpO2 100%  Physical Exam Constitutional:      Appearance: She is well-developed.  HENT:     Head: Normocephalic and atraumatic.  Eyes:     Conjunctiva/sclera:  Conjunctivae normal.  Cardiovascular:     Rate and Rhythm: Normal rate.     Pulses: Normal pulses.  Pulmonary:     Effort: Pulmonary effort is normal. No respiratory distress.     Breath sounds: No wheezing or rales.  Abdominal:     General: Abdomen is flat. Bowel sounds are absent. There is no distension or abdominal bruit.     Tenderness: There is abdominal tenderness in the epigastric area. There is no right CVA tenderness, left CVA tenderness or guarding. Negative signs include Murphy's sign, McBurney's sign and obturator sign.  Musculoskeletal:        General: Normal range of motion.     Cervical back: Normal range of motion.  Skin:    General: Skin is warm.     Findings: No rash.  Neurological:     General: No focal deficit present.     Mental Status: She is alert. She is disoriented.  Psychiatric:        Behavior: Behavior normal.        Thought Content: Thought content normal.     ED Results / Procedures / Treatments   Labs (all labs ordered are listed, but only abnormal results are displayed) Labs Reviewed  COMPREHENSIVE METABOLIC PANEL - Abnormal; Notable for the following components:      Result Value   Chloride 112 (*)    Glucose, Bld 107 (*)    Anion gap 3 (*)    All other components within normal limits  URINALYSIS, ROUTINE W REFLEX MICROSCOPIC - Abnormal; Notable for the following components:   Color, Urine AMBER (*)    APPearance CLOUDY (*)    Specific Gravity, Urine 1.031 (*)    Ketones, ur 5 (*)    Protein, ur 100 (*)    Bacteria, UA RARE (*)    All other components within normal limits  CBC WITH DIFFERENTIAL/PLATELET  LIPASE, BLOOD  POC URINE PREG, ED    EKG None  Radiology CT ABDOMEN PELVIS WO CONTRAST  Result Date: 02/21/2022 CLINICAL DATA:  Abdominal pain status post cholecystectomy. EXAM: CT ABDOMEN AND PELVIS WITHOUT CONTRAST TECHNIQUE: Multidetector CT imaging of the abdomen and pelvis was performed following the standard protocol without  IV contrast. RADIATION DOSE REDUCTION: This exam was performed according to the departmental dose-optimization program which includes automated exposure control, adjustment of the mA and/or kV according to patient size and/or use of iterative reconstruction technique. COMPARISON:  12/01/2021 FINDINGS: Lower chest: No acute abnormality. Hepatobiliary: No focal liver abnormality. Postop change from cholecystectomy. There are several radiodensities identified within the gallbladder fossa, image 44/5, which measure up to 4 mm. These may represent suture material versus small retained stone fragments. No focal fluid collection identified to suggest  abscess or biloma. Pancreas: Unremarkable. No pancreatic ductal dilatation or surrounding inflammatory changes. Spleen: Normal in size without focal abnormality. Adrenals/Urinary Tract: Normal adrenal glands. No nephrolithiasis, hydronephrosis or mass. Urinary bladder is unremarkable. Stomach/Bowel: The stomach appears normal. As noted previously the cecum is located within the mid lying of the abdomen. The appendix is not confidently identified. No bowel wall thickening, inflammation, or distension. Vascular/Lymphatic: Normal appearance of the abdominal aorta. No abdominopelvic adenopathy. Reproductive: Uterus and bilateral adnexa are unremarkable. Other: There is a trace amount of fluid noted within the dependent portion of the pelvis, image 71/2 no focal fluid collections identified. There is a small supraumbilical, midline hernia which contains fat as well as a small amount of free air, image 94/6. In soft tissue soft tissue stranding within the subcutaneous fat of bilateral abdominal wall likely reflects postop change. Musculoskeletal: No acute or significant osseous findings. Bilateral L5 pars defects. No significant anterolisthesis. IMPRESSION: 1. Postop change from cholecystectomy. There are several radiodensities identified within the gallbladder fossa, which measure  up to 4 mm. These may represent suture material versus tiny retained stone fragments. No focal fluid collection identified to suggest abscess or biloma. 2. Small supraumbilical, midline hernia which contains a fat as well as a small amount of free air. The gas is likely sequelae of recent surgery. 3. Bilateral L5 pars defects. Electronically Signed   By: Kerby Moors M.D.   On: 02/21/2022 18:28    Procedures Procedures    Medications Ordered in ED Medications  ondansetron (ZOFRAN-ODT) disintegrating tablet 4 mg (4 mg Oral Given 02/21/22 2108)  oxyCODONE (Oxy IR/ROXICODONE) immediate release tablet 5 mg (5 mg Oral Given 02/21/22 2108)    ED Course/ Medical Decision Making/ A&P                           Medical Decision Making Risk Prescription drug management.   31 year old female with some abdominal pain status postcholecystectomy 5 days ago.  She states pain developed today with mild nausea.  She has not had any pain medicine today.  She was given 1 oxycodone tablet here in the emergency department with Zofran which did seem to significant improve her pain as well as resolve her nausea.  Her vital signs are stable.  She is afebrile.  Nontachycardic.  CBC and CMP within normal limits.  A CT abdomen pelvis showed no acute changes or abscess formation.  Patient did have some clips placed which is likely the radiodensity seen on the CT scan.  Patient appears well, no distress.  Will discharge home she is encouraged to take medications for her bowels and to increase fluids.  She will take oxycodone as needed for her pain and she understands signs and symptoms return to the ER for such as any nausea, vomiting, increasing pain Final Clinical Impression(s) / ED Diagnoses Final diagnoses:  Postoperative pain  Generalized abdominal pain    Rx / DC Orders ED Discharge Orders     None         Renata Caprice 02/21/22 2235    Merlyn Lot, MD 02/21/22 2348

## 2022-02-21 NOTE — ED Triage Notes (Signed)
Pt comes with c/o belly pain.

## 2022-02-21 NOTE — Discharge Instructions (Addendum)
Please continue with pain medications as prescribed.  Make sure you are taking MiraLAX daily for your bowels.  Return to the ER for any fevers, increasing pain, worsening symptoms or any urgent changes in health

## 2022-02-21 NOTE — ED Notes (Signed)
Pt state abd pain entire lower and radiating to lower back began when she woke up this morning and has gotten progressively worse throughout the day.  Endorses nausea but no vomiting or dysuria.

## 2022-03-03 ENCOUNTER — Ambulatory Visit (INDEPENDENT_AMBULATORY_CARE_PROVIDER_SITE_OTHER): Payer: Medicaid Other | Admitting: Physician Assistant

## 2022-03-03 ENCOUNTER — Encounter: Payer: Self-pay | Admitting: Physician Assistant

## 2022-03-03 VITALS — BP 140/83 | HR 61 | Temp 98.2°F | Ht 66.0 in | Wt 285.0 lb

## 2022-03-03 DIAGNOSIS — K8020 Calculus of gallbladder without cholecystitis without obstruction: Secondary | ICD-10-CM

## 2022-03-03 DIAGNOSIS — Z09 Encounter for follow-up examination after completed treatment for conditions other than malignant neoplasm: Secondary | ICD-10-CM

## 2022-03-03 NOTE — Progress Notes (Signed)
[facility] POST-OP OFFICE VISIT  03/03/2022  HPI: Melanie Bell is a 31 y.o. female 15 days s/p robotic assisted laparoscopic cholecystectomy for symptomatic cholelithiasis with Dr Hampton Abbot   She did have ED visit on 10/09 for abdominal pain and nausea. Laboratory work up was reassuring and CT Abdomen/Pelvis did not show any overt complication   Today, she reports she is feeling much better. No significant abdominal pain. No fever, chills, nausea, emesis, or bowel changes. Incisions are healing well. She is tolerating PO. No other complaints   Vital signs: BP (!) 140/83   Pulse 61   Temp 98.2 F (36.8 C)   Ht '5\' 6"'$  (1.676 m)   Wt 285 lb (129.3 kg)   LMP 09/20/2021   SpO2 97%   BMI 46.00 kg/m    Physical Exam: Constitutional: Well appearing female, NAD Abdomen: Soft, non-tender, non-distended, no rebound/guarding Skin: Laparoscopic incisions are healing well, no erythema or drainage   Imaging reviewed:   CT Abdomen/Pelvis (02/21/2022) personally reviewed shows expected post-cholecystectomy changes, mentioned hypodensity are likely surgical clips, no other acute findings, and radiologist report reviewed below:  IMPRESSION: 1. Postop change from cholecystectomy. There are several radiodensities identified within the gallbladder fossa, which measure up to 4 mm. These may represent suture material versus tiny retained stone fragments. No focal fluid collection identified to suggest abscess or biloma. 2. Small supraumbilical, midline hernia which contains a fat as well as a small amount of free air. The gas is likely sequelae of recent surgery. 3. Bilateral L5 pars defects  Assessment/Plan: This is a 31 y.o. female 15 days s/p robotic assisted laparoscopic cholecystectomy for symptomatic cholelithiasis with Dr Hampton Abbot   - Pain control prn  - Reviewed wound care recommendation  - Reviewed lifting restrictions; 4 weeks total  - Reviewed surgical  pathology; [place]  - She can follow up on as needed basis; She understands to call with questions/concerns  -- Edison Simon, PA-C [facility] 03/03/2022, 1:56 PM M-F: 7am - 4pm

## 2022-03-03 NOTE — Patient Instructions (Signed)
GENERAL POST-OPERATIVE PATIENT INSTRUCTIONS   WOUND CARE INSTRUCTIONS:  Keep a dry clean dressing on the wound if there is drainage. The initial bandage may be removed after 24 hours.  Once the wound has quit draining you may leave it open to air.  If clothing rubs against the wound or causes irritation and the wound is not draining you may cover it with a dry dressing during the daytime.  Try to keep the wound dry and avoid ointments on the wound unless directed to do so.  If the wound becomes bright red and painful or starts to drain infected material that is not clear, please contact your physician immediately.  If the wound is mildly pink and has a thick firm ridge underneath it, this is normal, and is referred to as a healing ridge.  This will resolve over the next 4-6 weeks.  BATHING: You may shower if you have been informed of this by your surgeon. However, Please do not submerge in a tub, hot tub, or pool until incisions are completely sealed or have been told by your surgeon that you may do so.  DIET:  You may eat any foods that you can tolerate.  It is a good idea to eat a high fiber diet and take in plenty of fluids to prevent constipation.  If you do become constipated you may want to take a mild laxative or take ducolax tablets on a daily basis until your bowel habits are regular.  Constipation can be very uncomfortable, along with straining, after recent surgery.  ACTIVITY:  You are encouraged to cough and deep breath or use your incentive spirometer if you were given one, every 15-30 minutes when awake.  This will help prevent respiratory complications and low grade fevers post-operatively if you had a general anesthetic.  You may want to hug a pillow when coughing and sneezing to add additional support to the surgical area, if you had abdominal or chest surgery, which will decrease pain during these times.  You are encouraged to walk and engage in light activity for the next two weeks.  You  should not lift more than 20 pounds for 6 weeks total after surgery as it could put you at increased risk for complications.  Twenty pounds is roughly equivalent to a plastic bag of groceries. At that time- Listen to your body when lifting, if you have pain when lifting, stop and then try again in a few days. Soreness after doing exercises or activities of daily living is normal as you get back in to your normal routine.  MEDICATIONS:  Try to take narcotic medications and anti-inflammatory medications, such as tylenol, ibuprofen, naprosyn, etc., with food.  This will minimize stomach upset from the medication.  Should you develop nausea and vomiting from the pain medication, or develop a rash, please discontinue the medication and contact your physician.  You should not drive, make important decisions, or operate machinery when taking narcotic pain medication.  SUNBLOCK Use sun block to incision area over the next year if this area will be exposed to sun. This helps decrease scarring and will allow you avoid a permanent darkened area over your incision.  QUESTIONS:  Please feel free to call our office if you have any questions, and we will be glad to assist you. [phone]   

## 2022-03-10 ENCOUNTER — Telehealth: Payer: Medicaid Other | Admitting: Nurse Practitioner

## 2022-03-10 DIAGNOSIS — J069 Acute upper respiratory infection, unspecified: Secondary | ICD-10-CM

## 2022-03-10 MED ORDER — AMOXICILLIN-POT CLAVULANATE 875-125 MG PO TABS: 1.0000 | ORAL_TABLET | Freq: Two times a day (BID) | ORAL | 0 refills | Status: DC

## 2022-03-10 MED ORDER — BENZONATATE 100 MG PO CAPS: 100.0000 mg | ORAL_CAPSULE | Freq: Three times a day (TID) | ORAL | 0 refills | Status: DC | PRN

## 2022-03-10 NOTE — Patient Instructions (Signed)
Melanie Bell, thank you for joining Chevis Pretty, FNP for today's virtual visit.  While this provider is not your primary care provider (PCP), if your PCP is located in our provider database this encounter information will be shared with them immediately following your visit.   [facility] account gives you access to today's visit and all your visits, tests, and labs performed at [facility] " click here if you don't have a [facility] account or go to [email or web address]  Consent: (Patient) Melanie Bell provided verbal consent for this virtual visit at the beginning of the encounter.  Current Medications:  Current Outpatient Medications:    amoxicillin-clavulanate (AUGMENTIN) 875-125 MG tablet, Take 1 tablet by mouth 2 (two) times daily., Disp: 14 tablet, Rfl: 0   benzonatate (TESSALON PERLES) 100 MG capsule, Take 1 capsule (100 mg total) by mouth 3 (three) times daily as needed for cough., Disp: 20 capsule, Rfl: 0   acetaminophen (TYLENOL) 500 MG tablet, Take 2 tablets (1,000 mg total) by mouth every 6 (six) hours as needed for mild pain., Disp: , Rfl:    albuterol (VENTOLIN HFA) 108 (90 Base) MCG/ACT inhaler, 2 puffs four times a day as needed, Disp: 18 g, Rfl: 1   ARIPiprazole (ABILIFY) 5 MG tablet, Take 5 mg by mouth daily., Disp: , Rfl:    cabergoline (DOSTINEX) 0.5 MG tablet, Take 1 tablet (0.5 mg total) by mouth 2 (two) times a week., Disp: 26 tablet, Rfl: 2   escitalopram (LEXAPRO) 10 MG tablet, Take 10 mg by mouth daily., Disp: , Rfl:    fluticasone-salmeterol (ADVAIR HFA) 45-21 MCG/ACT inhaler, Inhale 2 puffs into the lungs 2 (two) times daily., Disp: 1 each, Rfl: 12   hydrOXYzine (VISTARIL) 50 MG capsule, Take 50 mg by mouth 2 (two) times daily as needed., Disp: , Rfl:    ibuprofen (ADVIL) 600 MG tablet, Take 1 tablet (600 mg total) by mouth every 8 (eight) hours as needed for moderate pain., Disp: 60 tablet, Rfl: 1    levothyroxine (SYNTHROID) 75 MCG tablet, Take 1 tablet (75 mcg total) by mouth as directed. Two tablets on Sundays and Wednesday and 1 tablet rest of the week, Disp: 117 tablet, Rfl: 2   lithium carbonate 300 MG capsule, Take 1 capsule (300 mg total) by mouth 2 (two) times daily with a meal., Disp: 60 capsule, Rfl: 1   magnesium oxide (MAG-OX) 400 MG tablet, Take 400 mg by mouth daily., Disp: , Rfl:    meloxicam (MOBIC) 7.5 MG tablet, Take 7.5 mg by mouth daily., Disp: , Rfl:    mupirocin ointment (BACTROBAN) 2 %, Apply 1 Application topically 2 (two) times daily., Disp: 22 g, Rfl: 0   omeprazole (PRILOSEC) 20 MG capsule, TAKE 1 CAPSULE (20 MG TOTAL) BY MOUTH DAILY., Disp: 90 capsule, Rfl: 1   prazosin (MINIPRESS) 2 MG capsule, Take 1 capsule (2 mg total) by mouth at bedtime., Disp: 30 capsule, Rfl: 0   pregabalin (LYRICA) 50 MG capsule, Take 50 mg by mouth 2 (two) times daily., Disp: , Rfl:    SUMAtriptan (IMITREX) 20 MG/ACT nasal spray, Place 20 mg into the nose as needed., Disp: , Rfl:    topiramate (TOPAMAX) 25 MG tablet, Take 25 mg by mouth 2 (two) times daily., Disp: , Rfl:    Medications ordered in this encounter:  Meds ordered this encounter  Medications   amoxicillin-clavulanate (AUGMENTIN) 875-125 MG tablet    Sig: Take 1 tablet by mouth 2 (two)  times daily.    Dispense:  14 tablet    Refill:  0    Order Specific Question:   Supervising Provider    Answer:   Chase Picket [[id number]]   benzonatate (TESSALON PERLES) 100 MG capsule    Sig: Take 1 capsule (100 mg total) by mouth 3 (three) times daily as needed for cough.    Dispense:  20 capsule    Refill:  0    Order Specific Question:   Supervising Provider    Answer:   Chase Picket [id number]     *If you need refills on other medications prior to your next appointment, please contact your pharmacy*  Follow-Up: Call back or seek an in-person evaluation if the symptoms worsen or if the condition fails to improve as  anticipated.  [place] [phone]  Other Instructions 1. Take meds as prescribed 2. Use a cool mist humidifier especially during the winter months and when heat has been humid. 3. Use saline nose sprays frequently 4. Saline irrigations of the nose can be very helpful if done frequently.  * 4X daily for 1 week*  * Use of a nettie pot can be helpful with this. Follow directions with this* 5. Drink plenty of fluids 6. Keep thermostat turn down low 7.For any cough or congestion- tessalon perles 8. For fever or aces or pains- take tylenol or ibuprofen appropriate for age and weight.  * for fevers greater than 101 orally you may alternate ibuprofen and tylenol every  3 hours.       If you have been instructed to have an in-person evaluation today at a local Urgent Care facility, please use the link below. It will take you to a list of all of our available [facility], including address, phone number and hours of operation. Please do not delay care.  [facility]  If you or a family member do not have a primary care provider, use the link below to schedule a visit and establish care. When you choose a [facility] primary care physician or advanced practice provider, you gain a long-term partner in health. Find a Primary Care Provider  Learn more about [facility]'s in-office and virtual care options: [facility]

## 2022-03-10 NOTE — Progress Notes (Signed)
Virtual Visit Consent   Melanie Bell, you are scheduled for a virtual visit with Melanie Bell, [place], a [facility] provider, today.     Just as with appointments in the office, your consent must be obtained to participate.  Your consent will be active for this visit and any virtual visit you may have with one of our providers in the next 365 days.     If you have a MyChart account, a copy of this consent can be sent to you electronically.  All virtual visits are billed to your insurance company just like a traditional visit in the office.    As this is a virtual visit, video technology does not allow for your provider to perform a traditional examination.  This may limit your provider's ability to fully assess your condition.  If your provider identifies any concerns that need to be evaluated in person or the need to arrange testing (such as labs, EKG, etc.), we will make arrangements to do so.     Although advances in technology are sophisticated, we cannot ensure that it will always work on either your end or our end.  If the connection with a video visit is poor, the visit may have to be switched to a telephone visit.  With either a video or telephone visit, we are not always able to ensure that we have a secure connection.     I need to obtain your verbal consent now.   Are you willing to proceed with your visit today? YES   Felipe Cabell has provided verbal consent on 03/10/2022 for a virtual visit (video or telephone).   Melanie Hassell Done, FNP   Date: 03/10/2022 6:22 PM   Virtual Visit via Video Note   I, Melanie Bell, connected with Melanie Bell ([id number], 1990/06/02) on 03/10/22 at  6:45 PM EDT by a video-enabled telemedicine application and verified that I am speaking with the correct person using two identifiers.  Location: Patient: Virtual Visit Location Patient: Home Provider: Virtual Visit Location Provider: Mobile   I discussed the  limitations of evaluation and management by telemedicine and the availability of in person appointments. The patient expressed understanding and agreed to proceed.    History of Present Illness: Melanie Bell is a 31 y.o. who identifies as a female who was assigned female at birth, and is being seen today for uri.  HPI: Patient does video visit stating that she has been sick for 3 weeks.  URI  This is a new problem. The current episode started 1 to 4 weeks ago. The problem has been waxing and waning. Associated symptoms include congestion, coughing, rhinorrhea and sinus pain (slight). Pertinent negatives include no ear pain, headaches or sore throat. Treatments tried: OTC cold and flu meds. The treatment provided mild relief.    Review of Systems  Constitutional:  Positive for fever (had fever several weeks ago but has resolved). Negative for chills.  HENT:  Positive for congestion, rhinorrhea and sinus pain (slight). Negative for ear pain and sore throat.   Respiratory:  Positive for cough. Negative for sputum production and shortness of breath.   Neurological:  Negative for headaches.    Problems:  Patient Active Problem List   Diagnosis Date Noted   Symptomatic cholelithiasis 02/16/2022   Peritoneal adhesions without obstruction 02/16/2022   Hematemesis 01/14/2022   Encounter for supervision of other normal pregnancy, first trimester 11/12/2021   Morbid obesity ([place]) 09/30/2021   Acquired hypothyroidism 06/25/2021  Migraine without aura and without status migrainosus, not intractable 03/05/2021   Mild intermittent asthma without complication 16/02/9603   Gastroesophageal reflux disease 12/01/2020   Hypoglycemia 12/01/2020   Rheumatoid factor positive 12/09/2019   SS-A antibody positive 10/28/2019   Chronic pain of both knees 10/28/2019   Hyperprolactinemia ([place]) 08/02/2019   Schizoaffective disorder, depressive type ([place]) 02/02/2018   Suicide attempt ([place]) 01/16/2018    Dysfunctional uterine bleeding 01/06/2018   Borderline personality disorder ([place]) 01/04/2018   PTSD (post-traumatic stress disorder) 01/03/2018   ASCUS of cervix with negative high risk HPV 08/28/2017   Malingering 05/06/2016    Allergies:  Allergies  Allergen Reactions   Betadine [Povidone Iodine] Other (See Comments)    Reaction:  Unknown    Fish Allergy Hives   Iodine Rash   Shellfish-Derived Products Other (See Comments)    Reaction:  Unknown    Medications:  Current Outpatient Medications:    acetaminophen (TYLENOL) 500 MG tablet, Take 2 tablets (1,000 mg total) by mouth every 6 (six) hours as needed for mild pain., Disp: , Rfl:    albuterol (VENTOLIN HFA) 108 (90 Base) MCG/ACT inhaler, 2 puffs four times a day as needed, Disp: 18 g, Rfl: 1   ARIPiprazole (ABILIFY) 5 MG tablet, Take 5 mg by mouth daily., Disp: , Rfl:    cabergoline (DOSTINEX) 0.5 MG tablet, Take 1 tablet (0.5 mg total) by mouth 2 (two) times a week., Disp: 26 tablet, Rfl: 2   escitalopram (LEXAPRO) 10 MG tablet, Take 10 mg by mouth daily., Disp: , Rfl:    fluticasone-salmeterol (ADVAIR HFA) 45-21 MCG/ACT inhaler, Inhale 2 puffs into the lungs 2 (two) times daily., Disp: 1 each, Rfl: 12   hydrOXYzine (VISTARIL) 50 MG capsule, Take 50 mg by mouth 2 (two) times daily as needed., Disp: , Rfl:    ibuprofen (ADVIL) 600 MG tablet, Take 1 tablet (600 mg total) by mouth every 8 (eight) hours as needed for moderate pain., Disp: 60 tablet, Rfl: 1   levothyroxine (SYNTHROID) 75 MCG tablet, Take 1 tablet (75 mcg total) by mouth as directed. Two tablets on Sundays and Wednesday and 1 tablet rest of the week, Disp: 117 tablet, Rfl: 2   lithium carbonate 300 MG capsule, Take 1 capsule (300 mg total) by mouth 2 (two) times daily with a meal., Disp: 60 capsule, Rfl: 1   magnesium oxide (MAG-OX) 400 MG tablet, Take 400 mg by mouth daily., Disp: , Rfl:    meloxicam (MOBIC) 7.5 MG tablet, Take 7.5 mg by mouth daily., Disp: , Rfl:     mupirocin ointment (BACTROBAN) 2 %, Apply 1 Application topically 2 (two) times daily., Disp: 22 g, Rfl: 0   omeprazole (PRILOSEC) 20 MG capsule, TAKE 1 CAPSULE (20 MG TOTAL) BY MOUTH DAILY., Disp: 90 capsule, Rfl: 1   prazosin (MINIPRESS) 2 MG capsule, Take 1 capsule (2 mg total) by mouth at bedtime., Disp: 30 capsule, Rfl: 0   pregabalin (LYRICA) 50 MG capsule, Take 50 mg by mouth 2 (two) times daily., Disp: , Rfl:    SUMAtriptan (IMITREX) 20 MG/ACT nasal spray, Place 20 mg into the nose as needed., Disp: , Rfl:    topiramate (TOPAMAX) 25 MG tablet, Take 25 mg by mouth 2 (two) times daily., Disp: , Rfl:   Observations/Objective: Patient is well-developed, well-nourished in no acute distress.  Resting comfortably  at home.  Head is normocephalic, atraumatic.  No labored breathing.  Speech is clear and coherent with logical content.  Patient is  alert and oriented at baseline.  Raspy voice Dry cough  Assessment and Plan:  Cianni Manny in today with chief complaint of URI   1. URI with cough and congestion 1. Take meds as prescribed 2. Use a cool mist humidifier especially during the winter months and when heat has been humid. 3. Use saline nose sprays frequently 4. Saline irrigations of the nose can be very helpful if Bell frequently.  * 4X daily for 1 week*  * Use of a nettie pot can be helpful with this. Follow directions with this* 5. Drink plenty of fluids 6. Keep thermostat turn down low 7.For any cough or congestion- tessalon perles 8. For fever or aces or pains- take tylenol or ibuprofen appropriate for age and weight.  * for fevers greater than 101 orally you may alternate ibuprofen and tylenol every  3 hours.   Meds ordered this encounter  Medications   amoxicillin-clavulanate (AUGMENTIN) 875-125 MG tablet    Sig: Take 1 tablet by mouth 2 (two) times daily.    Dispense:  14 tablet    Refill:  0    Order Specific Question:   Supervising Provider    Answer:    Chase Picket [[id number]]   benzonatate (TESSALON PERLES) 100 MG capsule    Sig: Take 1 capsule (100 mg total) by mouth 3 (three) times daily as needed for cough.    Dispense:  20 capsule    Refill:  0    Order Specific Question:   Supervising Provider    Answer:   Chase Picket [id number]      Follow Up Instructions: I discussed the assessment and treatment plan with the patient. The patient was provided an opportunity to ask questions and all were answered. The patient agreed with the plan and demonstrated an understanding of the instructions.  A copy of instructions were sent to the patient via MyChart.  The patient was advised to call back or seek an in-person evaluation if the symptoms worsen or if the condition fails to improve as anticipated.  Time:  I spent 9 minutes with the patient via telehealth technology discussing the above problems/concerns.    Melanie Hassell Done, FNP

## 2022-03-17 ENCOUNTER — Emergency Department: Payer: Medicaid Other

## 2022-03-17 ENCOUNTER — Emergency Department
Admission: EM | Admit: 2022-03-17 | Discharge: 2022-03-17 | Disposition: A | Discharge status: Home / Self Care | Payer: Medicaid Other | Attending: Emergency Medicine | Admitting: Emergency Medicine

## 2022-03-17 ENCOUNTER — Other Ambulatory Visit: Payer: Self-pay

## 2022-03-17 DIAGNOSIS — I10 Essential (primary) hypertension: Secondary | ICD-10-CM | POA: Diagnosis not present

## 2022-03-17 DIAGNOSIS — J45909 Unspecified asthma, uncomplicated: Secondary | ICD-10-CM | POA: Insufficient documentation

## 2022-03-17 DIAGNOSIS — J069 Acute upper respiratory infection, unspecified: Secondary | ICD-10-CM | POA: Insufficient documentation

## 2022-03-17 DIAGNOSIS — R059 Cough, unspecified: Secondary | ICD-10-CM | POA: Diagnosis present

## 2022-03-17 MED ORDER — DOXYCYCLINE HYCLATE 100 MG PO TABS
100.0000 mg | ORAL_TABLET | Freq: Once | ORAL | Status: AC
  Administered 2022-03-17: 100 mg via ORAL
  Filled 2022-03-17: qty 1

## 2022-03-17 MED ORDER — DOXYCYCLINE MONOHYDRATE 100 MG PO TABS: 100.0000 mg | ORAL_TABLET | Freq: Two times a day (BID) | ORAL | 0 refills | Status: AC

## 2022-03-17 NOTE — ED Triage Notes (Signed)
First Nurse note:  Arrives via [facility] from home. EMS called for SOB x 10 hours.  Recent diagnosis with URI.  Currently taking amoxicillin, one does left.  Has hx anxiety.  VS wnl.  No obvious SOB

## 2022-03-17 NOTE — Discharge Instructions (Addendum)
   Thank you for choosing us for your health care today!  Please see your primary doctor this week for a follow up appointment.   If you do not have a primary doctor call the following clinics to establish care:  If you have insurance:  [facility] [phone] [address] [place] [address]   [facility]  [phone] [address] [place] [address]   If you do not have insurance:  [facility]  [phone] [address] [place] [address]  Sometimes, in the early stages of certain disease courses it is difficult to detect in the emergency department evaluation -- so, it is important that you continue to monitor your symptoms and call your doctor right away or return to the emergency department if you develop any new or worsening symptoms.  It was my pleasure to care for you today.   Tosh Glaze S. Rakeya Glab, MD  

## 2022-03-17 NOTE — ED Provider Notes (Signed)
[facility] Provider Note    Event Date/Time   First MD Initiated Contact with Patient 03/17/22 1457     (approximate)   History   Cough   HPI  Melanie Bell is a 31 y.o. female   Past medical history of hypertension, hyperlipidemia, thyroid disease, schizoaffective, borderline, PTSD who presents to the emergency department with cough, subjective fever, nasal congestion and body aches.  Symptoms of been going on and off for the last 4 weeks and worsening over the last 1 week.  Patient was prescribed Augmentin by virtual provider about 5 days ago and has been taking without relief.  She has a history of asthma and has been taking inhaler with relief of shortness of breath.    Denies abdominal pain nausea vomiting or diarrhea.  No dysuria.  No other complaints.    History was obtained via.  External review of medical notes including virtual appointment 03/09/2022 where she was prescribed Augmentin for uri symptoms      Physical Exam   Triage Vital Signs: ED Triage Vitals  Enc Vitals Group     BP 03/17/22 1443 (!) 128/91     Pulse Rate 03/17/22 1443 (!) 59     Resp 03/17/22 1443 18     Temp 03/17/22 1443 98.3 F (36.8 C)     Temp src --      SpO2 03/17/22 1443 100 %     Weight 03/17/22 1443 285 lb (129.3 kg)     Height 03/17/22 1443 '5\' 8"'$  (1.727 m)     Head Circumference --      Peak Flow --      Pain Score 03/17/22 1445 0     Pain Loc --      Pain Edu? --      Excl. in [place]? --     Most recent vital signs: Vitals:   03/17/22 1443  BP: (!) 128/91  Pulse: (!) 59  Resp: 18  Temp: 98.3 F (36.8 C)  SpO2: 100%    General: Awake, no distress.  CV:  Good peripheral perfusion.  Resp:  Normal effort.  Lungs clear to auscultation without wheezing or focalities. Abd:  No distention.  Non tender Other: Overall well-appearing nontoxic-appearing, neck is supple with full range of motion, posterior oropharynx is normal without erythema,  lesions or exudates.  No fever in the emergency department.  Phonation is normal.   ED Results / Procedures / Treatments   Labs (all labs ordered are listed, but only abnormal results are displayed) Labs Reviewed - No data to display   RADIOLOGY I independently reviewed and interpreted chest x-ray shows no pneumothorax or focal opacities.   PROCEDURES:  Critical Care performed: No  Procedures   MEDICATIONS ORDERED IN ED: Medications  doxycycline (VIBRA-TABS) tablet 100 mg (has no administration in time range)     IMPRESSION / MDM / ASSESSMENT AND PLAN / ED COURSE  I reviewed the triage vital signs and the nursing notes.                              Differential diagnosis includes, but is not limited to, asthma exacerbation, viral URI, otitis, bacterial pneumonia, sinusitis considered but less likely emergent pathologies like meningitis, deep space neck infection, abscess of the neck, pneumothorax ACS or PE    MDM: Patient with upper respiratory infectious symptoms prescribed Augmentin 5 days ago and has been taking without  relief of symptoms, subjective fever and cough.  Lung exam is benign and chest x-ray with no focal opacities concerning for bacterial pneumonia, but given already started treatment for pneumonia will cover for atypical organisms with doxycycline prescription.  Patient appears well with no wheezing on auscultation, no signs of asthma exacerbation, doubt other emergent bacterial pathology in this overall well-appearing patient, will give outpatient prescription of doxycycline and have her follow-up with her primary doctor.   Patient's presentation is most consistent with acute presentation with potential threat to life or bodily function.       FINAL CLINICAL IMPRESSION(S) / ED DIAGNOSES   Final diagnoses:  Viral URI with cough     Rx / DC Orders   ED Discharge Orders          Ordered    doxycycline (ADOXA) 100 MG tablet  2 times daily         03/17/22 1533             Note:  This document was prepared using Dragon voice recognition software and may include unintentional dictation errors.    Lucillie Garfinkel, MD 03/17/22 [phone]

## 2022-03-17 NOTE — ED Triage Notes (Signed)
Pt comes with c/o cough and dx with URI. Pt was prescribed meds and has almost taken all of dose.

## 2022-03-18 ENCOUNTER — Telehealth: Payer: Self-pay

## 2022-03-18 NOTE — Telephone Encounter (Signed)
Transition Care Management Unsuccessful Follow-up Telephone Call  Date of discharge and from where:  03/17/2022 from ER [facility]  Attempts:  1st Attempt  Reason for unsuccessful TCM follow-up call:  primary number would not go through with OR without a 1. The secondary number had a voicemail- Left voice message to return call

## 2022-03-21 ENCOUNTER — Telehealth: Payer: Self-pay

## 2022-03-21 NOTE — Telephone Encounter (Signed)
Transition Care Management Unsuccessful Follow-up Telephone Call  Date of discharge and from where:  03/17/22/ [facility]  Attempts:  3rd Attempt  Reason for unsuccessful TCM follow-up call:  Left voice message

## 2022-03-21 NOTE — Telephone Encounter (Signed)
Transition Care Management Unsuccessful Follow-up Telephone Call  Date of discharge and from where:  03/17/2022 [facility]  Attempts:  2nd Attempt  Reason for unsuccessful TCM follow-up call:  Left voice message

## 2022-03-24 ENCOUNTER — Other Ambulatory Visit: Payer: Self-pay | Admitting: Internal Medicine

## 2022-03-24 DIAGNOSIS — K219 Gastro-esophageal reflux disease without esophagitis: Secondary | ICD-10-CM

## 2022-03-24 NOTE — Telephone Encounter (Signed)
Requested Prescriptions  Pending Prescriptions Disp Refills   omeprazole (PRILOSEC) 20 MG capsule [Pharmacy Med Name: OMEPRAZOLE 20 MG CPDR 20 Capsule] 90 capsule 0    Sig: TAKE 1 CAPSULE (20 MG TOTAL) BY MOUTH DAILY.     Gastroenterology: Proton Pump Inhibitors Passed - 03/24/2022  3:20 PM      Passed - Valid encounter within last 12 months    Recent Outpatient Visits           2 months ago Missed period   [facility] at [facility], Jesse Sans, MD   6 months ago Mild intermittent asthma without complication   [place] [facility] at [facility], Jesse Sans, MD   7 months ago Acute non-recurrent maxillary sinusitis   [place] [facility] at [facility], Jesse Sans, MD   1 year ago Hypoglycemia   [facility] and Sports Medicine at [facility], Jesse Sans, MD   1 year ago Hypoglycemia   [facility] Primary Care and Sports Medicine at [facility], Jesse Sans, MD

## 2022-04-19 ENCOUNTER — Emergency Department
Admission: EM | Admit: 2022-04-19 | Discharge: 2022-04-19 | Disposition: A | Discharge status: Home / Self Care | Payer: Medicaid Other | Attending: Emergency Medicine | Admitting: Emergency Medicine

## 2022-04-19 ENCOUNTER — Other Ambulatory Visit: Payer: Self-pay

## 2022-04-19 ENCOUNTER — Encounter: Payer: Self-pay | Admitting: Certified Nurse Midwife

## 2022-04-19 ENCOUNTER — Encounter: Payer: Self-pay | Admitting: Emergency Medicine

## 2022-04-19 DIAGNOSIS — L03311 Cellulitis of abdominal wall: Secondary | ICD-10-CM | POA: Insufficient documentation

## 2022-04-19 MED ORDER — CEPHALEXIN 500 MG PO CAPS: 500.0000 mg | ORAL_CAPSULE | Freq: Four times a day (QID) | ORAL | 0 refills | Status: AC

## 2022-04-19 MED ORDER — CEPHALEXIN 500 MG PO CAPS
500.0000 mg | ORAL_CAPSULE | Freq: Once | ORAL | Status: AC
  Administered 2022-04-19: 500 mg via ORAL
  Filled 2022-04-19: qty 1

## 2022-04-19 NOTE — ED Provider Notes (Signed)
   [facility] Provider Note    Event Date/Time   First MD Initiated Contact with Patient 04/19/22 1426     (approximate)   History   Insect Bite   HPI  Melanie Bell is a 31 y.o. female presents emergency department with complaints of cellulitis to the abdomen.  She states she thought was a bug bite.  She circled the area states it got a lot larger from yesterday.  Patient also just found out she is pregnant.      Physical Exam   Triage Vital Signs: ED Triage Vitals [04/19/22 1422]  Enc Vitals Group     BP 124/84     Pulse Rate 79     Resp 14     Temp 98.6 F (37 C)     Temp Source Oral     SpO2 100 %     Weight 285 lb (129.3 kg)     Height '5\' 7"'$  (1.702 m)     Head Circumference      Peak Flow      Pain Score 5     Pain Loc      Pain Edu?      Excl. in [place]?     Most recent vital signs: Vitals:   04/19/22 1422  BP: 124/84  Pulse: 79  Resp: 14  Temp: 98.6 F (37 C)  SpO2: 100%     General: Awake, no distress.   CV:  Good peripheral perfusion. regular rate and  rhythm Resp:  Normal effort.  Abd:  No distention.   Other:  Skin with a 4 inch x 3 inch round area with no drainage fluctuance or induration, neurovascular is intact   ED Results / Procedures / Treatments   Labs (all labs ordered are listed, but only abnormal results are displayed) Labs Reviewed - No data to display   EKG     RADIOLOGY     PROCEDURES:   Procedures   MEDICATIONS ORDERED IN ED: Medications  cephALEXin (KEFLEX) capsule 500 mg (500 mg Oral Given 04/19/22 1430)     IMPRESSION / MDM / [place] / ED COURSE  I reviewed the triage vital signs and the nursing notes.                              Differential diagnosis includes, but is not limited to, cellulitis, abscess, MRSA  Patient's presentation is most consistent with acute, uncomplicated illness.   Due to the cellulitis patient was started on Keflex.  This is  improved in pregnancy.  She is to apply warm compress.  Return emergency department worsening.  Discharged in stable condition.      FINAL CLINICAL IMPRESSION(S) / ED DIAGNOSES   Final diagnoses:  Cellulitis of abdominal wall     Rx / DC Orders   ED Discharge Orders          Ordered    cephALEXin (KEFLEX) 500 MG capsule  4 times daily        04/19/22 1428             Note:  This document was prepared using Dragon voice recognition software and may include unintentional dictation errors.    Versie Starks, PA-C 04/19/22 1431    Lavonia Drafts, MD 04/19/22 1534

## 2022-04-19 NOTE — ED Triage Notes (Signed)
First Nurse: Pt here with a spider bite to the LL side of her abd last night but states the area is bigger this morning. Pt is [redacted] weeks pregnant, pregnant 6 times and has had 5 miscarriages. Hx of asthma and anxiety.   132/80 75 100% RA 98.2 18

## 2022-04-19 NOTE — ED Triage Notes (Signed)
Says she cam because she  thinks she got bit by something yesterdaY/  she has it marked at edges by ems.  It is on lower abdomen. She is pregnant

## 2022-04-20 ENCOUNTER — Ambulatory Visit (INDEPENDENT_AMBULATORY_CARE_PROVIDER_SITE_OTHER): Payer: Medicaid Other

## 2022-04-20 VITALS — BP 143/76 | HR 88 | Wt 285.0 lb

## 2022-04-20 DIAGNOSIS — Z3200 Encounter for pregnancy test, result unknown: Secondary | ICD-10-CM

## 2022-04-20 DIAGNOSIS — Z3202 Encounter for pregnancy test, result negative: Secondary | ICD-10-CM

## 2022-04-20 NOTE — Progress Notes (Signed)
Subjective:    Melanie Bell is a 31 y.o. female who presents for evaluation of amenorrhea. She believes she could be pregnant. Pregnancy is desired.  Last period was normal Pt reports lite bleeding last Tuesday, just one time spotting and nothing else.    Patient's last menstrual period was 03/05/2022. The following portions of the patient's history were reviewed and updated as appropriate: allergies, current medications, past family history, past medical history, past social history, past surgical history, and problem list.   Lab Review Urine HCG: negative    Assessment:    Absence of menstruation.     Plan:    Pregnancy Test:  Negative :Briefly discussed Negative results and sent to Lab to do Beta, Pt reports she had a positive test at home. Pt aware results will go to MyChart and we will reach out when we receive results.

## 2022-04-21 LAB — BETA HCG QUANT (REF LAB): hCG Quant: <1 m[IU]/mL

## 2022-04-22 ENCOUNTER — Emergency Department
Admission: EM | Admit: 2022-04-22 | Discharge: 2022-04-22 | Disposition: A | Discharge status: Home / Self Care | Payer: Medicaid Other | Attending: Emergency Medicine | Admitting: Emergency Medicine

## 2022-04-22 ENCOUNTER — Emergency Department: Payer: Medicaid Other

## 2022-04-22 DIAGNOSIS — W01198A Fall on same level from slipping, tripping and stumbling with subsequent striking against other object, initial encounter: Secondary | ICD-10-CM | POA: Insufficient documentation

## 2022-04-22 DIAGNOSIS — Y92019 Unspecified place in single-family (private) house as the place of occurrence of the external cause: Secondary | ICD-10-CM | POA: Diagnosis not present

## 2022-04-22 DIAGNOSIS — S0990XA Unspecified injury of head, initial encounter: Secondary | ICD-10-CM | POA: Diagnosis present

## 2022-04-22 DIAGNOSIS — S060XAA Concussion with loss of consciousness status unknown, initial encounter: Secondary | ICD-10-CM | POA: Diagnosis not present

## 2022-04-22 MED ORDER — ONDANSETRON 4 MG PO TBDP: 4.0000 mg | ORAL_TABLET | Freq: Three times a day (TID) | ORAL | 0 refills | Status: DC | PRN

## 2022-04-22 NOTE — ED Triage Notes (Signed)
Pt sts that she fell and hit her head after tripping on the rug in the living room  and has been having worsening blurred vision. Pt sts that she fell around 1500 today.

## 2022-04-22 NOTE — ED Provider Triage Note (Signed)
Emergency Medicine Provider Triage Evaluation Note  Melanie Bell , a 31 y.o. female  was evaluated in triage.  Pt complains of mild headache and blurry vision after minor head injury.  Patient was at home and reported tripping and falling hitting her head about 3 PM.  She is unclear of any LOC.  She denies any associated nausea vomiting patient presents to the ED via EMS from home for evaluation of closed head injury.  Denies any other injury at this time.  Review of Systems  Positive: Minor head injury, blurry vision Negative: NV  Physical Exam  LMP 03/05/2022  Gen:   Awake, no distress  NAD Resp:  Normal effort  MSK:   Moves extremities without difficulty  Other:    Medical Decision Making  Medically screening exam initiated at 8:20 PM.  Appropriate orders placed.  Melanie Bell was informed that the remainder of the evaluation will be completed by another provider, this initial triage assessment does not replace that evaluation, and the importance of remaining in the ED until their evaluation is complete.  Patient to the ED for evaluation of injury sustained following a minor head injury at home.  Patient denies any nausea, vomiting or blood thinner use patient does endorse mild headache and some mild blurry vision.   Melanie Needles, PA-C 04/22/22 2035

## 2022-04-22 NOTE — ED Provider Notes (Signed)
[facility] Emergency Department Provider Note     Event Date/Time   First MD Initiated Contact with Patient 04/22/22 2036     (approximate)   History   Fall   HPI  Melanie Bell is a 31 y.o. female complains of mild headache and blurry vision after minor head injury.  Patient was at home and reported tripping and falling hitting her head about 3 PM.  She is unclear of any LOC.  She denies any associated nausea vomiting patient presents to the ED via EMS from home for evaluation of closed head injury.  Denies any other injury at this time.        Physical Exam   Triage Vital Signs: ED Triage Vitals  Enc Vitals Group     BP 04/22/22 2020 123/71     Pulse Rate 04/22/22 2020 73     Resp 04/22/22 2020 17     Temp 04/22/22 2020 98.2 F (36.8 C)     Temp Source 04/22/22 2020 Oral     SpO2 04/22/22 2020 98 %     Weight 04/22/22 2021 285 lb (129.3 kg)     Height --      Head Circumference --      Peak Flow --      Pain Score 04/22/22 2021 8     Pain Loc --      Pain Edu? --      Excl. in [place]? --     Most recent vital signs: Vitals:   04/22/22 2020  BP: 123/71  Pulse: 73  Resp: 17  Temp: 98.2 F (36.8 C)  SpO2: 98%    General Awake, no distress. NAD HEENT NCAT. PERRL. EOMI. No rhinorrhea. Mucous membranes are moist.  CV:  Good peripheral perfusion.  RESP:  Normal effort.  ABD:  No distention.  NEURO: Cranial nerves II to XII grossly intact.   ED Results / Procedures / Treatments   Labs (all labs ordered are listed, but only abnormal results are displayed) Labs Reviewed - No data to display   EKG   RADIOLOGY  I personally viewed and evaluated these images as part of my medical decision making, as well as reviewing the written report by the radiologist.  ED Provider Interpretation: no acute findings  CT HEAD WO CONTRAST (5MM)  Result Date: 04/22/2022 CLINICAL DATA:  Head trauma, moderate-severe Fall, hit head on  table. Headache. EXAM: CT HEAD WITHOUT CONTRAST TECHNIQUE: Contiguous axial images were obtained from the base of the skull through the vertex without intravenous contrast. RADIATION DOSE REDUCTION: This exam was performed according to the departmental dose-optimization program which includes automated exposure control, adjustment of the mA and/or kV according to patient size and/or use of iterative reconstruction technique. COMPARISON:  Most recent head CT 11/30/2021 FINDINGS: Brain: No intracranial hemorrhage, mass effect, or midline shift. No hydrocephalus. The basilar cisterns are patent. No evidence of territorial infarct or acute ischemia. No extra-axial or intracranial fluid collection. Vascular: No hyperdense vessel or unexpected calcification. Skull: Normal. Negative for fracture or focal lesion. Sinuses/Orbits: No acute finding. Other: No confluent scalp hematoma. IMPRESSION: Negative noncontrast head CT. Electronically Signed   By: Keith Rake M.D.   On: 04/22/2022 20:55     PROCEDURES:  Critical Care performed: No  Procedures   MEDICATIONS ORDERED IN ED: Medications - No data to display   IMPRESSION / MDM / [place] / ED COURSE  I reviewed the triage vital signs  and the nursing notes.                              Differential diagnosis includes, but is not limited to, intracranial hemorrhage, meningitis/encephalitis, previous head trauma, cavernous venous thrombosis, tension headache, temporal arteritis, migraine or migraine equivalent, idiopathic intracranial hypertension, and non-specific headache.   Patient's presentation is most consistent with acute complicated illness / injury requiring diagnostic workup.  Patient to the ED for evaluation of ongoing symptoms following a minor head injury.  She presents to the ED in no acute distress.  Patient with a reassuring exam as she had no red flags and no acute neuromuscular deficit.  She was further evaluated with CT  imaging, which based on my interpretation, does not reveal any acute intracranial process.    Patient's diagnosis is consistent with Minor head injury with mild postconcussive syndrome. Patient will be discharged home with prescriptions for Zofran. Patient is to follow up with her primary provider as needed or otherwise directed. Patient is given ED precautions to return to the ED for any worsening or new symptoms.     FINAL CLINICAL IMPRESSION(S) / ED DIAGNOSES   Final diagnoses:  Minor head injury, initial encounter  Concussion with unknown loss of consciousness status, initial encounter     Rx / DC Orders   ED Discharge Orders          Ordered    ondansetron (ZOFRAN-ODT) 4 MG disintegrating tablet  Every 8 hours PRN        04/22/22 2216             Note:  This document was prepared using Dragon voice recognition software and may include unintentional dictation errors.    [name], PA-C 04/27/22 0116    Lucillie Garfinkel, MD 04/27/22 [phone]

## 2022-04-22 NOTE — ED Notes (Signed)
Discharge summary given, reviewed, and discharged by [place], [place].

## 2022-04-22 NOTE — ED Triage Notes (Signed)
First nurse note: pt to ED via EMS from home c/o mechanical fall, hit head on table, c/o headache with hx migraines, denies LOC or thinners, states blurry vision after fall.  EMS vitals 80 CBG, 126/70 BP, 75 HR, 99% RA.

## 2022-04-22 NOTE — Discharge Instructions (Addendum)
Your exam and and head CT are normal and reassuring at this time.  No signs of serious head injury.  Your symptoms are consistent with a mild concussion.  Take the prescription nausea medicine as needed.  Follow with your primary provider for ongoing symptoms.

## 2022-04-26 ENCOUNTER — Other Ambulatory Visit: Payer: Self-pay

## 2022-04-26 DIAGNOSIS — W01190A Fall on same level from slipping, tripping and stumbling with subsequent striking against furniture, initial encounter: Secondary | ICD-10-CM | POA: Insufficient documentation

## 2022-04-26 DIAGNOSIS — S060XAA Concussion with loss of consciousness status unknown, initial encounter: Secondary | ICD-10-CM | POA: Diagnosis not present

## 2022-04-26 DIAGNOSIS — R112 Nausea with vomiting, unspecified: Secondary | ICD-10-CM | POA: Diagnosis present

## 2022-04-26 DIAGNOSIS — I10 Essential (primary) hypertension: Secondary | ICD-10-CM | POA: Diagnosis not present

## 2022-04-26 LAB — COMPREHENSIVE METABOLIC PANEL
ALT: 12 U/L (ref 0–44)
AST: 17 U/L (ref 15–41)
Albumin: 3.7 g/dL (ref 3.5–5.0)
Alkaline Phosphatase: 69 U/L (ref 38–126)
Anion gap: 7 (ref 5–15)
BUN: 9 mg/dL (ref 6–20)
CO2: 25 mmol/L (ref 22–32)
Calcium: 9.3 mg/dL (ref 8.9–10.3)
Chloride: 109 mmol/L (ref 98–111)
Creatinine, Ser: 0.79 mg/dL (ref 0.44–1.00)
GFR, Estimated: >60 mL/min (ref >60)
Glucose, Bld: 112 mg/dL — ABNORMAL HIGH (ref 70–99)
Potassium: 3.7 mmol/L (ref 3.5–5.1)
Sodium: 141 mmol/L (ref 135–145)
Total Bilirubin: 0.6 mg/dL (ref 0.3–1.2)
Total Protein: 6.9 g/dL (ref 6.5–8.1)

## 2022-04-26 LAB — CBC
HCT: 41.4 % (ref 36.0–46.0)
Hemoglobin: 13.2 g/dL (ref 12.0–15.0)
MCH: 29.6 pg (ref 26.0–34.0)
MCHC: 31.9 g/dL (ref 30.0–36.0)
MCV: 92.8 fL (ref 80.0–100.0)
Platelets: 199 10*3/uL (ref 150–400)
RBC: 4.46 MIL/uL (ref 3.87–5.11)
RDW: 12.9 % (ref 11.5–15.5)
WBC: 9.2 10*3/uL (ref 4.0–10.5)
nRBC: 0 % (ref 0.0–0.2)

## 2022-04-26 LAB — LIPASE, BLOOD: Lipase: 36 U/L (ref 11–51)

## 2022-04-26 NOTE — ED Triage Notes (Signed)
Pt brought in by [facility] for vomiting. States was treated for concussion 12/8 and has had persistent vomiting and headaches since.

## 2022-04-26 NOTE — ED Triage Notes (Signed)
EMS brings pt in from home; to lobby via w/c with no distress noted; reports N/V since 12/8; seen recently for concussion post fall; took zofran PTA with relief

## 2022-04-27 ENCOUNTER — Emergency Department
Admission: EM | Admit: 2022-04-27 | Discharge: 2022-04-27 | Disposition: A | Discharge status: Home / Self Care | Payer: Medicaid Other | Attending: Emergency Medicine | Admitting: Emergency Medicine

## 2022-04-27 DIAGNOSIS — S060XAD Concussion with loss of consciousness status unknown, subsequent encounter: Secondary | ICD-10-CM

## 2022-04-27 LAB — POC URINE PREG, ED: Preg Test, Ur: NEGATIVE

## 2022-04-27 LAB — URINALYSIS, ROUTINE W REFLEX MICROSCOPIC
Bilirubin Urine: NEGATIVE
Glucose, UA: NEGATIVE mg/dL
Hgb urine dipstick: NEGATIVE
Ketones, ur: NEGATIVE mg/dL
Leukocytes,Ua: NEGATIVE
Nitrite: NEGATIVE
Protein, ur: NEGATIVE mg/dL
Specific Gravity, Urine: 1.023 (ref 1.005–1.030)
pH: 5 (ref 5.0–8.0)

## 2022-04-27 NOTE — ED Provider Notes (Signed)
[facility] Provider Note    Event Date/Time   First MD Initiated Contact with Patient 04/27/22 [phone]     (approximate)   History   Vomiting   HPI  Melanie Bell is a 31 y.o. female   Past medical history of hypertension, hyperlipidemia, thyroid disease, borderline, PTSD, schizoaffective presents to the emergency department with ongoing headache and intermittent nausea from a head injury sustained earlier this week.  She tripped and fell and hit a table with her head and came to the emergency department at that time with a negative head CT.  Since then she has been having ongoing symptoms of headache and intermittent nausea and vomiting.  No other new injuries or symptoms.    Is been taking Zofran and Tylenol with some effect.   History was obtained via the patient.  Reviewed external medical notes including emergency department visit dated 04/22/2022 head injury with negative CT      Physical Exam   Triage Vital Signs: ED Triage Vitals  Enc Vitals Group     BP 04/26/22 2153 (!) 141/88     Pulse Rate 04/26/22 2153 66     Resp 04/26/22 2153 18     Temp 04/26/22 2153 98.4 F (36.9 C)     Temp Source 04/26/22 2153 Oral     SpO2 04/26/22 2150 99 %     Weight 04/26/22 2154 285 lb (129.3 kg)     Height 04/26/22 2154 '5\' 7"'$  (1.702 m)     Head Circumference --      Peak Flow --      Pain Score 04/26/22 2153 7     Pain Loc --      Pain Edu? --      Excl. in [place]? --     Most recent vital signs: Vitals:   04/26/22 2153 04/27/22 0145  BP: (!) 141/88 138/88  Pulse: 66 68  Resp: 18 18  Temp: 98.4 F (36.9 C) 98.3 F (36.8 C)  SpO2: 98% 98%    General: Awake, no distress.  CV:  Good peripheral perfusion.  Resp:  Normal effort.  Abd:  No distention.  Other:  Alert oriented, conversant, moving all extremities, no obvious head injuries noted, normal hemodynamics and appears euvolemic.  Abdomen is soft and nontender.   ED Results / Procedures  / Treatments   Labs (all labs ordered are listed, but only abnormal results are displayed) Labs Reviewed  COMPREHENSIVE METABOLIC PANEL - Abnormal; Notable for the following components:      Result Value   Glucose, Bld 112 (*)    All other components within normal limits  URINALYSIS, ROUTINE W REFLEX MICROSCOPIC - Abnormal; Notable for the following components:   Color, Urine YELLOW (*)    APPearance HAZY (*)    All other components within normal limits  CBC  LIPASE, BLOOD  POC URINE PREG, ED     I reviewed labs and they are notable for  glucose 112    PROCEDURES:  Critical Care performed: No  Procedures   MEDICATIONS ORDERED IN ED: Medications - No data to display   IMPRESSION / MDM / [place] / ED COURSE  I reviewed the triage vital signs and the nursing notes.                              Differential diagnosis includes, but is not limited to, postconcussive syndrome, intra-abdominal  infection, obstruction, intracranial bleeding, CVA, pregnancy related    MDM: Ongoing symptoms from concussion.  Exam benign.  Appears euvolemic.  Abdomen soft and nontender, doubt intra-abdominal pathology relating to her nausea.  Advised that it will take some time for her symptoms to resolve and gave anticipatory guidance with Tylenol for pain control and Zofran for nausea.  If her symptoms continue she will follow-up with her already established neurologist for concussion.  Given normal CT at the time of injury and no new injuries will defer further imaging given very low risk for intracranial bleeding or delayed bleeding  Patient's presentation is most consistent with acute presentation with potential threat to life or bodily function.       FINAL CLINICAL IMPRESSION(S) / ED DIAGNOSES   Final diagnoses:  Concussion with unknown loss of consciousness status, subsequent encounter     Rx / DC Orders   ED Discharge Orders     None        Note:  This  document was prepared using Dragon voice recognition software and may include unintentional dictation errors.    Lucillie Garfinkel, MD 04/27/22 [phone]

## 2022-04-27 NOTE — Discharge Instructions (Addendum)
Take Tylenol 650 mg every 6 hours for headache.  Take Zofran as already prescribed for nausea.  Make sure you get plenty of rest and avoid strenuous physical activity and strenuous mental activities while your brain heals from concussion.  If at the end of the week your symptoms continue then call your neurologist for follow-up appointment.  Thank you for choosing [name] for your health care today!  Please see your primary doctor this week for a follow up appointment.   Sometimes, in the early stages of certain disease courses it is difficult to detect in the emergency department evaluation -- so, it is important that you continue to monitor your symptoms and call your doctor right away or return to the emergency department if you develop any new or worsening symptoms.  Please go to the following website to schedule new (and existing) patient appointments:   [email or web address]  If you do not have a primary doctor try calling the following clinics to establish care:  If you have insurance:  [facility] [phone] [place] [address]   [facility]  [phone] [place] [place] [address]   If you do not have insurance:  [facility]  [phone] [address], [place] [address]   The following is another list of primary care offices in the area who are accepting new patients at this time.  Please reach out to one of them directly and let them know you would like to schedule an appointment to follow up on an Emergency Department visit, and/or to establish a new primary care provider (PCP).  There are likely other primary care clinics in the are who are accepting new patients, but this is an excellent place to start:  [place] physician: Dr Lavon Paganini [address] [place], [place] [address] [phone]  [facility] Lead  Physician: Dr Steele Sizer [address], [place], [place] [address] [phone]  [place] Physician: Dr Park Liter [address] [place], [place] [address] [phone]  [facility] Lead Physician: Dr Dewaine Oats [place], [place], [place] [address] [phone]  [place] [facility] [place] Physician: Dr Halina Maidens [address] [place], [place]  [address] [phone]   It was my pleasure to care for you today.   [place]over Brunette Jacelyn Grip, MD

## 2022-05-02 ENCOUNTER — Encounter: Payer: Self-pay | Admitting: Internal Medicine

## 2022-05-02 ENCOUNTER — Ambulatory Visit (INDEPENDENT_AMBULATORY_CARE_PROVIDER_SITE_OTHER): Payer: Medicaid Other | Admitting: Internal Medicine

## 2022-05-02 VITALS — BP 136/80 | HR 84 | Ht 67.0 in | Wt 286.0 lb

## 2022-05-02 DIAGNOSIS — E221 Hyperprolactinemia: Secondary | ICD-10-CM

## 2022-05-02 DIAGNOSIS — E039 Hypothyroidism, unspecified: Secondary | ICD-10-CM | POA: Diagnosis not present

## 2022-05-02 LAB — BASIC METABOLIC PANEL
BUN: 14 mg/dL (ref 6–23)
CO2: 28 mEq/L (ref 19–32)
Calcium: 9.9 mg/dL (ref 8.4–10.5)
Chloride: 105 mEq/L (ref 96–112)
Creatinine, Ser: 0.77 mg/dL (ref 0.40–1.20)
GFR: 101.71 mL/min (ref >60.00)
Glucose, Bld: 95 mg/dL (ref 70–99)
Potassium: 4 mEq/L (ref 3.5–5.1)
Sodium: 140 mEq/L (ref 135–145)

## 2022-05-02 LAB — TSH: TSH: 1.12 u[IU]/mL (ref 0.35–5.50)

## 2022-05-02 MED ORDER — LEVOTHYROXINE SODIUM 75 MCG PO TABS: 75.0000 ug | ORAL_TABLET | Freq: Every day | ORAL | 3 refills | Status: DC

## 2022-05-02 NOTE — Patient Instructions (Signed)

## 2022-05-02 NOTE — Progress Notes (Unsigned)
[name]  MRN/ DOB: [id number], Oct 27, 1990    Age/ Sex: 31 y.o., female     PCP: Glean Hess, MD   Reason for Endocrinology Evaluation: Hyperprolactinemia      Initial Endocrinology Clinic Visit: 08/02/2019    PATIENT IDENTIFIER: Melanie Bell is a 31 y.o., female with a past medical history of Bipolar disorder and anxiety. She has followed with [facility] since 08/02/2019 for consultative assistance with management of her hyperprolactinemia .   HISTORICAL SUMMARY:  Pt was noted to have an elevated prolactin level at 66.3 ng/mL, during evaluation of secondary amenorrhea and galactorrhea in 06/2019.    She does endorse history of elevated prolactin since her teenage years.  She has been diagnosed with bipolar disorder, PTSD and anxiety and has been on multiple antipsychotic medications over the years.  She has been on ox carbamazepine as well as risperidone in the past.   Prolactin on initial presentation to our clinic was 40.2 ng/mL, with normal LH, FSH, Testosterone and TSH    An MRI was obtained 09/09/2019 which demonstrated normal pituitary but abnormal white matter.  Pt was advised to discuss switching Invega with psych as it has been associated with elevated prolactin .    She was seen by Dr. Tomi Likens ( Neurology for abnormal white matter)   Sister  And mother with hypothyroidism and  Sister with hyperthyroidism    She was started on cabergoline 04/2021 with elevated prolactin again with a max level of 62.7 ng/mL     Invega switched to Abilify 2023 by psychiatry   HYPOTHYROID HISTORY:  Patient reports being on LT-4 replacement therapy since her teenage years, she had ran out of her medication by 06/2019 and was advised not to restart since her TFTs were normal. Repeat TSH elevated in 07/2019 was elevated at 4.77 uIU/mL. And LT-4 replacement restarted.     Got married 1/7th 2023 She does not have a guardian anymore, reinstated  her rights 07/2020   SUBJECTIVE:    Today (12/20/2019):  Melanie Bell is here for hyperprolactinemia and hypothyroidism.    She is s/p cholecystectomy 94/1740, complicated by cellulitis of the abdominal wall 04/19/2022  She had a presentation to the ED on 04/27/2022 for concussion-CT head negative. Continues with dizziness   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 and recently dx with DM   Medications Levothyroxine 75 mcg daily  Carbergoline 0.5 twice weekly ( Wednesday and Sunday )        HISTORY:  Past Medical History:  Past Medical History:  Diagnosis Date   Allergy    Anxiety    Arthritis    Asthma    Depression    Foreign body aspiration    GERD (gastroesophageal reflux disease)    Hallucinations 09/30/2014   Sizoaffective   Hyperlipidemia    Hypertension    Hypoglycemia    Intentional ingestion of batteries 04/21/2016    2 AAA batteries and 1 thumb tack; intent to hurt herself/notes 81/08/4816   Self-inflicted injury 56/31/4970   Suicidal ideation 01/02/2018   Tardive dyskinesia 10/2014   recent onset   Thyroid disease    Past Surgical History:  Past Surgical History:  Procedure Laterality Date  ABDOMINAL SURGERY     "years ago" to remove foreign objects   APPENDECTOMY     BREAST EXCISIONAL BIOPSY Right 09/03/2007   surgical bx removed fibroadeoma   BREAST LUMPECTOMY Right    COLONOSCOPY WITH PROPOFOL N/A 09/10/2015   Procedure: COLONOSCOPY WITH PROPOFOL;  Surgeon: [name], MD;  Location: [facility] ENDOSCOPY;  Service: Endoscopy;  Laterality: N/A;   ESOPHAGOGASTRODUODENOSCOPY N/A 11/28/2014   Procedure: ESOPHAGOGASTRODUODENOSCOPY (EGD);  Surgeon: [name], MD;  Location: [facility] ENDOSCOPY;  Service:  Endoscopy;  Laterality: N/A;   ESOPHAGOGASTRODUODENOSCOPY N/A 02/21/2016   Procedure: ESOPHAGOGASTRODUODENOSCOPY (EGD);  Surgeon: Mauri Pole, MD;  Location: [facility] ENDOSCOPY;  Service: Endoscopy;  Laterality: N/A;   ESOPHAGOGASTRODUODENOSCOPY N/A 04/21/2016   Procedure: ESOPHAGOGASTRODUODENOSCOPY (EGD);  Surgeon: Gatha Mayer, MD;  Location: [facility] ENDOSCOPY;  Service: Endoscopy;  Laterality: N/A;   ESOPHAGOGASTRODUODENOSCOPY N/A 05/06/2016   Procedure: ESOPHAGOGASTRODUODENOSCOPY (EGD);  Surgeon: Jonathon Bellows, MD;  Location: [facility] ENDOSCOPY;  Service: Gastroenterology;  Laterality: N/A;   ESOPHAGOGASTRODUODENOSCOPY N/A 06/13/2016   Procedure: ESOPHAGOGASTRODUODENOSCOPY (EGD);  Surgeon: [name], MD;  Location: [facility] ENDOSCOPY;  Service: Endoscopy;  Laterality: N/A;   ESOPHAGOGASTRODUODENOSCOPY N/A 01/14/2022   Procedure: ESOPHAGOGASTRODUODENOSCOPY (EGD);  Surgeon: Toledo, Benay Pike, MD;  Location: [facility] ENDOSCOPY;  Service: Gastroenterology;  Laterality: N/A;   ESOPHAGOGASTRODUODENOSCOPY (EGD) WITH PROPOFOL N/A 02/29/2016   Procedure: ESOPHAGOGASTRODUODENOSCOPY (EGD) WITH PROPOFOL;  Surgeon: [name], MD;  Location: [facility] ENDOSCOPY;  Service: Endoscopy;  Laterality: N/A;   ESOPHAGOGASTRODUODENOSCOPY (EGD) WITH PROPOFOL N/A 01/15/2018   Procedure: ESOPHAGOGASTRODUODENOSCOPY (EGD) WITH PROPOFOL;  Surgeon: Daneil Dolin, MD;  Location: [facility] ENDO SUITE;  Service: Endoscopy;  Laterality: N/A;  retrieval forein body   LAPAROSCOPIC LYSIS OF ADHESIONS  02/16/2022   Procedure: LAPAROSCOPIC LYSIS OF ADHESIONS;  Surgeon: Olean Ree, MD;  Location: [facility] ORS;  Service: General;;   LAPAROTOMY N/A 09/12/2015   Procedure: EXPLORATORY LAPAROTOMY;  Surgeon: Florene Glen, MD;  Location: [facility] ORS;  Service: General;  Laterality: N/A;   SIGMOIDOSCOPY N/A 09/12/2015   Procedure: Lonell Face;  Surgeon: Florene Glen, MD;  Location: [facility] ORS;  Service: General;  Laterality: N/A;   WISDOM TOOTH EXTRACTION      Social History:  reports that she quit smoking about 5 years ago. Her smoking use included cigarettes. She has a 1.50 pack-year smoking history. She has been exposed to tobacco smoke. She has never used smokeless tobacco. She reports that she does not drink alcohol and does not use drugs. Family History:  Family History  Problem Relation Age of Onset   Depression Mother    Hypertension Mother    Sleep apnea Mother    Asthma Mother    COPD Mother    Diabetes Mother    Anxiety disorder Mother    Arthritis Mother    Breast cancer Maternal Aunt        20's   Stroke Maternal Grandmother      HOME MEDICATIONS: Allergies as of 05/02/2022       Reactions   Betadine [povidone Iodine] Other (See Comments)   Reaction:  Unknown    Fish Allergy Hives   Iodine Rash   Shellfish-derived Products Other (See Comments)   Reaction:  Unknown         Medication List        Accurate as of May 02, 2022  2:30 PM. If you have any questions, ask your nurse or doctor.          STOP taking these medications  acetaminophen 500 MG tablet Commonly known as: TYLENOL Stopped by: Dorita Sciara, MD       TAKE these medications    albuterol 108 (90 Base) MCG/ACT inhaler Commonly known as: VENTOLIN HFA 2 puffs four times a day as needed   ARIPiprazole 5 MG tablet Commonly known as: ABILIFY Take 5 mg by mouth daily.   cabergoline 0.5 MG tablet Commonly known as: DOSTINEX TAKE 1 TABLET (0.5 MG TOTAL) BY MOUTH 2 (TWO) TIMES A WEEK. {[id number]}   escitalopram 10 MG tablet Commonly known as: LEXAPRO Take 10 mg by mouth daily.   fluticasone-salmeterol 45-21 MCG/ACT inhaler Commonly known as: ADVAIR HFA Inhale 2 puffs into the lungs 2 (two) times daily.   hydrOXYzine 50 MG capsule Commonly known as: VISTARIL Take 50 mg by mouth 2 (two) times daily as needed.   levothyroxine 75 MCG tablet Commonly known as: SYNTHROID Take 1 tablet (75 mcg total) by mouth as  directed. Two tablets on Sundays and Wednesday and 1 tablet rest of the week   lithium carbonate 300 MG capsule Take 1 capsule (300 mg total) by mouth 2 (two) times daily with a meal.   magnesium oxide 400 MG tablet Commonly known as: MAG-OX Take 400 mg by mouth daily.   omeprazole 20 MG capsule Commonly known as: PRILOSEC TAKE 1 CAPSULE (20 MG TOTAL) BY MOUTH DAILY.   ondansetron 4 MG disintegrating tablet Commonly known as: ZOFRAN-ODT Take 1 tablet (4 mg total) by mouth every 8 (eight) hours as needed for nausea or vomiting.   prazosin 2 MG capsule Commonly known as: MINIPRESS Take 1 capsule (2 mg total) by mouth at bedtime.   pregabalin 50 MG capsule Commonly known as: LYRICA Take 50 mg by mouth 2 (two) times daily.   SUMAtriptan 20 MG/ACT nasal spray Commonly known as: IMITREX Place 20 mg into the nose as needed.   topiramate 25 MG tablet Commonly known as: TOPAMAX Take 25 mg by mouth 2 (two) times daily.          OBJECTIVE:   PHYSICAL EXAM: VS: BP 136/80 (BP Location: Left Arm, Patient Position: Sitting, Cuff Size: Large)   Pulse 84   Ht '5\' 7"'$  (1.702 m)   Wt 286 lb (129.7 kg)   LMP 04/26/2022 (Exact Date)   SpO2 96%   BMI 44.79 kg/m   EXAM: General: Pt appears well and is in NAD  Neck: General: Supple without adenopathy. Thyroid: Thyroid size normal.    Lungs: Clear with good BS bilat with no rales, rhonchi, or wheezes  Heart: Auscultation: RRR.  Abdomen: Normoactive bowel sounds, soft, nontender, without masses or organomegaly palpable  Extremities:  BL LE: No pretibial edema normal ROM and strength.  Mental Status: Judgment, insight: Intact Mood and affect: No depression, anxiety, or agitation     DATA REVIEWED:   Latest Reference Range & Units 05/02/22 10:35  Sodium 135 - 145 mEq/L 140  Potassium 3.5 - 5.1 mEq/L 4.0  Chloride 96 - 112 mEq/L 105  CO2 19 - 32 mEq/L 28  Glucose 70 - 99 mg/dL 95  BUN 6 - 23 mg/dL 14  Creatinine 0.40 - 1.20  mg/dL 0.77  Calcium 8.4 - 10.5 mg/dL 9.9  GFR >60.00 mL/min 101.71    Latest Reference Range & Units 05/02/22 10:35  TSH 0.35 - 5.50 uIU/mL 1.12     MRI brain 09/09/2019 Scattered foci of T2 hyperintensity are seen within the white matter of the cerebral hemispheres predominantly deep and juxtacortical white  matter. No posterior fossa lesion identified. None of the lesions show enhancement after the intravenous administration of contrast.   Pituitary/Sella: The pituitary gland is normal in appearance and size for age without mass lesion. Mildly heterogeneous enhancement in the dynamic phase with homogeneous enhancement on the delayed is within normal limits. A normal posterior pituitary bright spot is seen. The infundibulum is midline. The hypothalamus and mamillary bodies are normal. There is no mass effect on the optic chiasm or optic nerves. The infundibular and chiasmatic recesses are clear. Normal cavernous sinus and cavernous internal carotid artery flow voids.  ASSESSMENT / PLAN / RECOMMENDATIONS:    1.   Hypothyroidism   - Pt is clinically and biochemically euthyroid   - No local neck symptoms   -TSH normal Medication  Continue levothyroxine 75 mcg daily    2.Hyperprolactinemia:     -Patient with chronic long-term history of intermittent hyperprolactinemia.This is attributed to multiple antipsychotic medications that have she has been on and off since her teenage years, that are known to cause hyperprolactinemia such as risperidone and paliperidone Lorayne Bender ) -Repeat prolactin levels have started to increase in 2022, requiring initiation of small dose of cabergoline - MRI did NOT show any evidence of pituitary adenoma  -Patient advised to stop cabergoline with positive pregnancy test     Medication   Continue cabergoline 0.5 mg, 1 tablet twice weekly   F/U in 6 months     Signed electronically by: Mack Guise, MD  [facility]   [facility] [address] [place], [place] [address] Phone: [phone] FAX: [phone]      CC: Glean Hess, [address] [place] [address] Phone: [phone]  Fax: [phone]   Return to Endocrinology clinic as below: Future Appointments  Date Time Provider [place]  11/08/2022 10:10 AM Whit Bruni, Melanie Crazier, MD [id number] None  01/11/2023  1:00 PM [place] ADVISOR [facility]

## 2022-05-03 LAB — PROLACTIN: Prolactin: 3.1 ng/mL

## 2022-05-04 MED ORDER — CABERGOLINE 0.5 MG PO TABS: 0.5000 mg | ORAL_TABLET | ORAL | 3 refills | Status: DC

## 2022-05-16 ENCOUNTER — Emergency Department
Admission: EM | Admit: 2022-05-16 | Discharge: 2022-05-16 | Disposition: A | Discharge status: Home / Self Care | Payer: Medicaid Other | Attending: Emergency Medicine | Admitting: Emergency Medicine

## 2022-05-16 ENCOUNTER — Encounter: Payer: Self-pay | Admitting: Emergency Medicine

## 2022-05-16 ENCOUNTER — Other Ambulatory Visit: Payer: Self-pay

## 2022-05-16 ENCOUNTER — Emergency Department: Payer: Medicaid Other

## 2022-05-16 DIAGNOSIS — S0990XA Unspecified injury of head, initial encounter: Secondary | ICD-10-CM | POA: Insufficient documentation

## 2022-05-16 DIAGNOSIS — W19XXXA Unspecified fall, initial encounter: Secondary | ICD-10-CM

## 2022-05-16 DIAGNOSIS — W01198A Fall on same level from slipping, tripping and stumbling with subsequent striking against other object, initial encounter: Secondary | ICD-10-CM | POA: Insufficient documentation

## 2022-05-16 DIAGNOSIS — G43809 Other migraine, not intractable, without status migrainosus: Secondary | ICD-10-CM | POA: Diagnosis not present

## 2022-05-16 LAB — CBC
HCT: 40.5 % (ref 36.0–46.0)
Hemoglobin: 13.3 g/dL (ref 12.0–15.0)
MCH: 29.9 pg (ref 26.0–34.0)
MCHC: 32.8 g/dL (ref 30.0–36.0)
MCV: 91 fL (ref 80.0–100.0)
Platelets: 205 10*3/uL (ref 150–400)
RBC: 4.45 MIL/uL (ref 3.87–5.11)
RDW: 13.1 % (ref 11.5–15.5)
WBC: 12.1 10*3/uL — ABNORMAL HIGH (ref 4.0–10.5)
nRBC: 0 % (ref 0.0–0.2)

## 2022-05-16 LAB — BASIC METABOLIC PANEL
Anion gap: 7 (ref 5–15)
BUN: 17 mg/dL (ref 6–20)
CO2: 22 mmol/L (ref 22–32)
Calcium: 9.4 mg/dL (ref 8.9–10.3)
Chloride: 109 mmol/L (ref 98–111)
Creatinine, Ser: 0.78 mg/dL (ref 0.44–1.00)
GFR, Estimated: >60 mL/min (ref >60)
Glucose, Bld: 93 mg/dL (ref 70–99)
Potassium: 4 mmol/L (ref 3.5–5.1)
Sodium: 138 mmol/L (ref 135–145)

## 2022-05-16 LAB — POC URINE PREG, ED: Preg Test, Ur: NEGATIVE

## 2022-05-16 MED ORDER — MAGNESIUM SULFATE 2 GM/50ML IV SOLN
2.0000 g | Freq: Once | INTRAVENOUS | Status: AC
  Administered 2022-05-16: 2 g via INTRAVENOUS
  Filled 2022-05-16: qty 50

## 2022-05-16 MED ORDER — METOCLOPRAMIDE HCL 5 MG/ML IJ SOLN
10.0000 mg | Freq: Once | INTRAMUSCULAR | Status: AC
  Administered 2022-05-16: 10 mg via INTRAVENOUS
  Filled 2022-05-16: qty 2

## 2022-05-16 MED ORDER — SODIUM CHLORIDE 0.9 % IV BOLUS
1000.0000 mL | Freq: Once | INTRAVENOUS | Status: AC
  Administered 2022-05-16: 1000 mL via INTRAVENOUS

## 2022-05-16 MED ORDER — IOHEXOL 350 MG/ML SOLN
75.0000 mL | Freq: Once | INTRAVENOUS | Status: AC | PRN
  Administered 2022-05-16: 75 mL via INTRAVENOUS

## 2022-05-16 MED ORDER — KETOROLAC TROMETHAMINE 15 MG/ML IJ SOLN
15.0000 mg | Freq: Once | INTRAMUSCULAR | Status: AC
  Administered 2022-05-16: 15 mg via INTRAVENOUS
  Filled 2022-05-16: qty 1

## 2022-05-16 MED ORDER — DIPHENHYDRAMINE HCL 50 MG/ML IJ SOLN
12.5000 mg | Freq: Once | INTRAMUSCULAR | Status: AC
  Administered 2022-05-16: 12.5 mg via INTRAVENOUS
  Filled 2022-05-16: qty 1

## 2022-05-16 NOTE — ED Provider Notes (Signed)
[facility] Provider Note    Event Date/Time   First MD Initiated Contact with Patient 05/16/22 1100     (approximate)   History   Fall B   HPI  Melanie Bell is a 32 y.o. female past medical history significant for migraine headaches, who presents to the emergency department following a fall with a headache.  States that she woke up this morning at 5:00 this morning and had dizziness and a headache.  States that she fell and hit her head at approximately 8:00 this morning.  Since that time states that she has been having trouble with her speech and her husband states that her speech has not sounded right.  Complaining of right-sided headache.  Denies any extremity numbness or weakness.  States that this does not feel similar to prior migraine headaches and he is never hurt her speech like this.     Physical Exam   Triage Vital Signs: ED Triage Vitals  Enc Vitals Group     BP 05/16/22 1038 138/77     Pulse Rate 05/16/22 1038 63     Resp 05/16/22 1038 18     Temp 05/16/22 1038 98.3 F (36.8 C)     Temp Source 05/16/22 1038 Oral     SpO2 05/16/22 1038 97 %     Weight 05/16/22 1024 284 lb 6.3 oz (129 kg)     Height 05/16/22 1024 '5\' 7"'$  (1.702 m)     Head Circumference --      Peak Flow --      Pain Score 05/16/22 1023 7     Pain Loc --      Pain Edu? --      Excl. in [place]? --     Most recent vital signs: Vitals:   05/16/22 1038 05/16/22 1545  BP: 138/77 138/78  Pulse: 63 66  Resp: 18 18  Temp: 98.3 F (36.8 C) 98.3 F (36.8 C)  SpO2: 97% 98%    Physical Exam Constitutional:      Appearance: She is well-developed.  HENT:     Head: Atraumatic.  Eyes:     Conjunctiva/sclera: Conjunctivae normal.  Cardiovascular:     Rate and Rhythm: Regular rhythm.  Pulmonary:     Effort: No respiratory distress.  Abdominal:     General: There is no distension.  Musculoskeletal:        General: Normal range of motion.     Cervical back: Normal  range of motion.  Skin:    General: Skin is warm.  Neurological:     Mental Status: She is alert. Mental status is at baseline.     Cranial Nerves: Cranial nerves 2-12 are intact.     Sensory: Sensation is intact.     Motor: Motor function is intact.     Coordination: Coordination is intact.     Deep Tendon Reflexes: Reflexes are normal and symmetric.     Comments: Dysarthric speech, able to name objects, alert and oriented.      IMPRESSION / MDM / ASSESSMENT AND PLAN / ED COURSE  I reviewed the triage vital signs and the nursing notes.  Differential diagnosis including CVA, dissection, complex migraine, postconcussive syndrome, intracranial hemorrhage  Patient is outside of the window for TNK given her symptom onset was more than 5 hours prior to arrival.  NIH scale of less than 6 with low suspicion for LVO.  Will obtain CTA head and neck to evaluate for dissection or  intracranial hemorrhage.     RADIOLOGY I independently reviewed imaging, my interpretation of imaging: CTA and CT face showed no acute fracture or dislocation.  No signs of intracranial hemorrhage or dissection.  No signs of stenosis.  Discussed the results with the radiologist on-call.   Labs (all labs ordered are listed, but only abnormal results are displayed) Labs interpreted as -    Labs Reviewed  CBC - Abnormal; Notable for the following components:      Result Value   WBC 12.1 (*)    All other components within normal limits  BASIC METABOLIC PANEL  POC URINE PREG, ED      Patient was treated with migraine medication.  Continues to have trouble with her speech and slurred speech with mild improvement of her headache.  Ordered MRI with and without contrast to further evaluate for CVA or multiple sclerosis.  Given another dose of IV ketorolac and magnesium.  On reevaluation patient had complete resolution of her symptoms.  States that she feels much better and her speech is back to her normal.   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  No other risk factors for CVA.  She follows closely with neurology.  States that she will follow-up with her neurologist and discuss whether she needs an outpatient MRI.  Given return precautions for any worsening symptoms.   PROCEDURES:  Critical Care performed: No  Procedures  Patient's presentation is most consistent with acute presentation with potential threat to life or bodily function.   MEDICATIONS ORDERED IN ED: Medications  sodium chloride 0.9 % bolus 1,000 mL (0 mLs Intravenous Stopped 05/16/22 1544)  metoCLOPramide (REGLAN) injection 10 mg (10 mg Intravenous Given 05/16/22 1202)  diphenhydrAMINE (BENADRYL) injection 12.5 mg (12.5 mg Intravenous Given 05/16/22 1202)  iohexol (OMNIPAQUE) 350 MG/ML injection 75 mL (75 mLs Intravenous Contrast Given 05/16/22 1306)  ketorolac (TORADOL) 15 MG/ML injection 15 mg (15 mg Intravenous Given 05/16/22 1353)  magnesium sulfate IVPB 2 g 50 mL (0 g Intravenous Stopped 05/16/22 1511)    FINAL CLINICAL IMPRESSION(S) / ED DIAGNOSES   Final diagnoses:  Fall, initial encounter  Injury of head, initial encounter  Other migraine without status migrainosus, not intractable     Rx / DC Orders   ED Discharge Orders     None        Note:  This document was prepared using Dragon voice recognition software and may include unintentional dictation errors.   Nathaniel Man, MD 05/16/22 1705

## 2022-05-16 NOTE — ED Triage Notes (Signed)
Pt in via EMS from with c/o fall. EMS reports pt fell today and hit her head and has a knot on right forehead. 122/72, HR 66, 100% RA

## 2022-05-16 NOTE — ED Triage Notes (Signed)
Pt reports fell this am and hit her head. Denies LOC. Pt also reports hx of migraines and states that she has had a migraine for a few days. Pt takes 2 different meds for her migraines

## 2022-05-18 ENCOUNTER — Telehealth: Payer: Medicaid Other | Admitting: Physician Assistant

## 2022-05-18 DIAGNOSIS — R6889 Other general symptoms and signs: Secondary | ICD-10-CM

## 2022-05-18 MED ORDER — ONDANSETRON 4 MG PO TBDP: 4.0000 mg | ORAL_TABLET | Freq: Three times a day (TID) | ORAL | 0 refills | Status: DC | PRN

## 2022-05-18 MED ORDER — OSELTAMIVIR PHOSPHATE 75 MG PO CAPS: 75.0000 mg | ORAL_CAPSULE | Freq: Two times a day (BID) | ORAL | 0 refills | Status: DC

## 2022-05-18 NOTE — Patient Instructions (Signed)
Melanie Bell, thank you for joining Mar Daring, PA-C for today's virtual visit.  While this provider is not your primary care provider (PCP), if your PCP is located in our provider database this encounter information will be shared with them immediately following your visit.   [facility] account gives you access to today's visit and all your visits, tests, and labs performed at [facility] " click here if you don't have a [facility] account or go to [email or web address]  Consent: (Patient) Melanie Bell provided verbal consent for this virtual visit at the beginning of the encounter.  Current Medications:  Current Outpatient Medications:    ondansetron (ZOFRAN-ODT) 4 MG disintegrating tablet, Take 1 tablet (4 mg total) by mouth every 8 (eight) hours as needed., Disp: 20 tablet, Rfl: 0   oseltamivir (TAMIFLU) 75 MG capsule, Take 1 capsule (75 mg total) by mouth 2 (two) times daily., Disp: 10 capsule, Rfl: 0   albuterol (VENTOLIN HFA) 108 (90 Base) MCG/ACT inhaler, 2 puffs four times a day as needed, Disp: 18 g, Rfl: 1   ARIPiprazole (ABILIFY) 5 MG tablet, Take 5 mg by mouth daily., Disp: , Rfl:    cabergoline (DOSTINEX) 0.5 MG tablet, Take 1 tablet (0.5 mg total) by mouth once a week., Disp: 12 tablet, Rfl: 3   escitalopram (LEXAPRO) 10 MG tablet, Take 10 mg by mouth daily., Disp: , Rfl:    fluticasone-salmeterol (ADVAIR HFA) 45-21 MCG/ACT inhaler, Inhale 2 puffs into the lungs 2 (two) times daily., Disp: 1 each, Rfl: 12   hydrOXYzine (VISTARIL) 50 MG capsule, Take 50 mg by mouth 2 (two) times daily as needed., Disp: , Rfl:    levothyroxine (SYNTHROID) 75 MCG tablet, Take 1 tablet (75 mcg total) by mouth daily before breakfast., Disp: 90 tablet, Rfl: 3   lithium carbonate 300 MG capsule, Take 1 capsule (300 mg total) by mouth 2 (two) times daily with a meal., Disp: 60 capsule, Rfl: 1   magnesium oxide (MAG-OX) 400 MG tablet, Take 400 mg  by mouth daily., Disp: , Rfl:    omeprazole (PRILOSEC) 20 MG capsule, TAKE 1 CAPSULE (20 MG TOTAL) BY MOUTH DAILY., Disp: 90 capsule, Rfl: 0   prazosin (MINIPRESS) 2 MG capsule, Take 1 capsule (2 mg total) by mouth at bedtime., Disp: 30 capsule, Rfl: 0   pregabalin (LYRICA) 50 MG capsule, Take 50 mg by mouth 2 (two) times daily., Disp: , Rfl:    SUMAtriptan (IMITREX) 20 MG/ACT nasal spray, Place 20 mg into the nose as needed., Disp: , Rfl:    topiramate (TOPAMAX) 25 MG tablet, Take 25 mg by mouth 2 (two) times daily., Disp: , Rfl:    Medications ordered in this encounter:  Meds ordered this encounter  Medications   oseltamivir (TAMIFLU) 75 MG capsule    Sig: Take 1 capsule (75 mg total) by mouth 2 (two) times daily.    Dispense:  10 capsule    Refill:  0    Order Specific Question:   Supervising Provider    Answer:   LAMPTEY, PHILIP O [[id number]]   ondansetron (ZOFRAN-ODT) 4 MG disintegrating tablet    Sig: Take 1 tablet (4 mg total) by mouth every 8 (eight) hours as needed.    Dispense:  20 tablet    Refill:  0    Order Specific Question:   Supervising Provider    Answer:   Chase Picket [id number]     *If you need refills on  other medications prior to your next appointment, please contact your pharmacy*  Follow-Up: Call back or seek an in-person evaluation if the symptoms worsen or if the condition fails to improve as anticipated.  [place] [phone]  Other Instructions Influenza, Adult Influenza, also called "the flu," is a viral infection that mainly affects the respiratory tract. This includes the lungs, nose, and throat. The flu spreads easily from person to person (is contagious). It causes common cold symptoms, along with high fever and body aches. What are the causes? This condition is caused by the influenza virus. You can get the virus by: Breathing in droplets that are in the air from an infected person's cough or sneeze. Touching something that  has the virus on it (has been contaminated) and then touching your mouth, nose, or eyes. What increases the risk? The following factors may make you more likely to get the flu: Not washing or sanitizing your hands often. Having close contact with many people during cold and flu season. Touching your mouth, eyes, or nose without first washing or sanitizing your hands. Not getting an annual flu shot. You may have a higher risk for the flu, including serious problems, such as a lung infection (pneumonia), if you: Are older than 65. Are pregnant. Have a weakened disease-fighting system (immune system). This includes people who have HIV or AIDS, are on chemotherapy, or are taking medicines that reduce (suppress) the immune system. Have a long-term (chronic) illness, such as heart disease, kidney disease, diabetes, or lung disease. Have a liver disorder. Are severely overweight (morbidly obese). Have anemia. Have asthma. What are the signs or symptoms? Symptoms of this condition usually begin suddenly and last 4-14 days. These may include: Fever and chills. Headaches, body aches, or muscle aches. Sore throat. Cough. Runny or stuffy (congested) nose. Chest discomfort. Poor appetite. Weakness or fatigue. Dizziness. Nausea or vomiting. How is this diagnosed? This condition may be diagnosed based on: Your symptoms and medical history. A physical exam. Swabbing your nose or throat and testing the fluid for the influenza virus. How is this treated? If the flu is diagnosed early, you can be treated with antiviral medicine that is given by mouth (orally) or through an IV. This can help reduce how severe the illness is and how long it lasts. Taking care of yourself at home can help relieve symptoms. Your health care provider may recommend: Taking over-the-counter medicines. Drinking plenty of fluids. In many cases, the flu goes away on its own. If you have severe symptoms or complications, you  may be treated in a hospital. Follow these instructions at home: Activity Rest as needed and get plenty of sleep. Stay home from work or school as told by your health care provider. Unless you are visiting your health care provider, avoid leaving home until your fever has been gone for 24 hours without taking medicine. Eating and drinking Take an oral rehydration solution (ORS). This is a drink that is sold at pharmacies and retail stores. Drink enough fluid to keep your urine pale yellow. Drink clear fluids in small amounts as you are able. Clear fluids include water, ice chips, fruit juice mixed with water, and low-calorie sports drinks. Eat bland, easy-to-digest foods in small amounts as you are able. These foods include bananas, applesauce, rice, lean meats, toast, and crackers. Avoid drinking fluids that contain a lot of sugar or caffeine, such as energy drinks, regular sports drinks, and soda. Avoid alcohol. Avoid spicy or fatty foods.  General instructions     Take over-the-counter and prescription medicines only as told by your health care provider. Use a cool mist humidifier to add humidity to the air in your home. This can make it easier to breathe. When using a cool mist humidifier, clean it daily. Empty the water and replace it with clean water. Cover your mouth and nose when you cough or sneeze. Wash your hands with soap and water often and for at least 20 seconds, especially after you cough or sneeze. If soap and water are not available, use alcohol-based hand sanitizer. Keep all follow-up visits. This is important. How is this prevented?  Get an annual flu shot. This is usually available in late summer, fall, or winter. Ask your health care provider when you should get your flu shot. Avoid contact with people who are sick during cold and flu season. This is generally fall and winter. Contact a health care provider if: You develop new symptoms. You have: Chest  pain. Diarrhea. A fever. Your cough gets worse. You produce more mucus. You feel nauseous or you vomit. Get help right away if you: Develop shortness of breath or have difficulty breathing. Have skin or nails that turn a bluish color. Have severe pain or stiffness in your neck. Develop a sudden headache or sudden pain in your face or ear. Cannot eat or drink without vomiting. These symptoms may represent a serious problem that is an emergency. Do not wait to see if the symptoms will go away. Get medical help right away. Call your local emergency services (911 in the [place] Do not drive yourself to the hospital. Summary Influenza, also called "the flu," is a viral infection that primarily affects your respiratory tract. Symptoms of the flu usually begin suddenly and last 4-14 days. Getting an annual flu shot is the best way to prevent getting the flu. Stay home from work or school as told by your health care provider. Unless you are visiting your health care provider, avoid leaving home until your fever has been gone for 24 hours without taking medicine. Keep all follow-up visits. This is important. This information is not intended to replace advice given to you by your health care provider. Make sure you discuss any questions you have with your health care provider. Document Revised: 12/20/2019 Document Reviewed: 12/20/2019 Elsevier Patient Education  [place].    If you have been instructed to have an in-person evaluation today at a local Urgent Care facility, please use the link below. It will take you to a list of all of our available [facility], including address, phone number and hours of operation. Please do not delay care.  [facility]  If you or a family member do not have a primary care provider, use the link below to schedule a visit and establish care. When you choose a [facility] primary care physician or advanced practice provider, you  gain a long-term partner in health. Find a Primary Care Provider  Learn more about [facility]'s in-office and virtual care options: [facility]

## 2022-05-18 NOTE — Progress Notes (Signed)
Virtual Visit Consent   Melanie Bell, you are scheduled for a virtual visit with a [facility] provider today. Just as with appointments in the office, your consent must be obtained to participate. Your consent will be active for this visit and any virtual visit you may have with one of our providers in the next 365 days. If you have a MyChart account, a copy of this consent can be sent to you electronically.  As this is a virtual visit, video technology does not allow for your provider to perform a traditional examination. This may limit your provider's ability to fully assess your condition. If your provider identifies any concerns that need to be evaluated in person or the need to arrange testing (such as labs, EKG, etc.), we will make arrangements to do so. Although advances in technology are sophisticated, we cannot ensure that it will always work on either your end or our end. If the connection with a video visit is poor, the visit may have to be switched to a telephone visit. With either a video or telephone visit, we are not always able to ensure that we have a secure connection.  By engaging in this virtual visit, you consent to the provision of healthcare and authorize for your insurance to be billed (if applicable) for the services provided during this visit. Depending on your insurance coverage, you may receive a charge related to this service.  I need to obtain your verbal consent now. Are you willing to proceed with your visit today? Melanie Bell has provided verbal consent on 05/18/2022 for a virtual visit (video or telephone). Mar Daring, PA-C  Date: 05/18/2022 3:00 PM  Virtual Visit via Video Note   I, Mar Daring, connected with  Melanie Bell  ([id number], 09/18/1990) on 05/18/22 at  3:00 PM EST by a video-enabled telemedicine application and verified that I am speaking with the correct person using two identifiers.  Location: Patient: Virtual  Visit Location Patient: Home Provider: Virtual Visit Location Provider: Home Office   I discussed the limitations of evaluation and management by telemedicine and the availability of in person appointments. The patient expressed understanding and agreed to proceed.    History of Present Illness: Melanie Bell is a 32 y.o. who identifies as a female who was assigned female at birth, and is being seen today for flu-like symptoms.  HPI: URI  This is a new problem. The current episode started yesterday. The problem has been gradually worsening. The maximum temperature recorded prior to her arrival was 103 - 104 F (103.7 highest). The fever has been present for 1 to 2 days. Associated symptoms include congestion, coughing, ear pain, headaches, nausea, rhinorrhea, sinus pain, a sore throat and vomiting. Pertinent negatives include no plugged ear sensation. She has tried acetaminophen (mucinex) for the symptoms. The treatment provided no relief.  Step son has been sick with influenza.    Problems:  Patient Active Problem List   Diagnosis Date Noted   Symptomatic cholelithiasis 02/16/2022   Peritoneal adhesions without obstruction 02/16/2022   Hematemesis 01/14/2022   Encounter for supervision of other normal pregnancy, first trimester 11/12/2021   Morbid obesity ([place]) 09/30/2021   Acquired hypothyroidism 06/25/2021   Migraine without aura and without status migrainosus, not intractable 03/05/2021   Mild intermittent asthma without complication 09/62/8366   Gastroesophageal reflux disease 12/01/2020   Hypoglycemia 12/01/2020   Rheumatoid factor positive 12/09/2019   SS-A antibody positive 10/28/2019   Chronic pain of  both knees 10/28/2019   Hyperprolactinemia ([place]) 08/02/2019   Schizoaffective disorder, depressive type ([place]) 02/02/2018   Suicide attempt ([place]) 01/16/2018   Dysfunctional uterine bleeding 01/06/2018   Borderline personality disorder ([place]) 01/04/2018   PTSD  (post-traumatic stress disorder) 01/03/2018   ASCUS of cervix with negative high risk HPV 08/28/2017   Malingering 05/06/2016    Allergies:  Allergies  Allergen Reactions   Betadine [Povidone Iodine] Other (See Comments)    Reaction:  Unknown    Fish Allergy Hives   Shellfish-Derived Products Other (See Comments)    Reaction:  Unknown    Medications:  Current Outpatient Medications:    ondansetron (ZOFRAN-ODT) 4 MG disintegrating tablet, Take 1 tablet (4 mg total) by mouth every 8 (eight) hours as needed., Disp: 20 tablet, Rfl: 0   oseltamivir (TAMIFLU) 75 MG capsule, Take 1 capsule (75 mg total) by mouth 2 (two) times daily., Disp: 10 capsule, Rfl: 0   albuterol (VENTOLIN HFA) 108 (90 Base) MCG/ACT inhaler, 2 puffs four times a day as needed, Disp: 18 g, Rfl: 1   ARIPiprazole (ABILIFY) 5 MG tablet, Take 5 mg by mouth daily., Disp: , Rfl:    cabergoline (DOSTINEX) 0.5 MG tablet, Take 1 tablet (0.5 mg total) by mouth once a week., Disp: 12 tablet, Rfl: 3   escitalopram (LEXAPRO) 10 MG tablet, Take 10 mg by mouth daily., Disp: , Rfl:    fluticasone-salmeterol (ADVAIR HFA) 45-21 MCG/ACT inhaler, Inhale 2 puffs into the lungs 2 (two) times daily., Disp: 1 each, Rfl: 12   hydrOXYzine (VISTARIL) 50 MG capsule, Take 50 mg by mouth 2 (two) times daily as needed., Disp: , Rfl:    levothyroxine (SYNTHROID) 75 MCG tablet, Take 1 tablet (75 mcg total) by mouth daily before breakfast., Disp: 90 tablet, Rfl: 3   lithium carbonate 300 MG capsule, Take 1 capsule (300 mg total) by mouth 2 (two) times daily with a meal., Disp: 60 capsule, Rfl: 1   magnesium oxide (MAG-OX) 400 MG tablet, Take 400 mg by mouth daily., Disp: , Rfl:    omeprazole (PRILOSEC) 20 MG capsule, TAKE 1 CAPSULE (20 MG TOTAL) BY MOUTH DAILY., Disp: 90 capsule, Rfl: 0   prazosin (MINIPRESS) 2 MG capsule, Take 1 capsule (2 mg total) by mouth at bedtime., Disp: 30 capsule, Rfl: 0   pregabalin (LYRICA) 50 MG capsule, Take 50 mg by mouth 2  (two) times daily., Disp: , Rfl:    SUMAtriptan (IMITREX) 20 MG/ACT nasal spray, Place 20 mg into the nose as needed., Disp: , Rfl:    topiramate (TOPAMAX) 25 MG tablet, Take 25 mg by mouth 2 (two) times daily., Disp: , Rfl:   Observations/Objective: Patient is well-developed, well-nourished in no acute distress.  Resting comfortably at home.  Head is normocephalic, atraumatic.  No labored breathing.  Speech is clear and coherent with logical content.  Patient is alert and oriented at baseline.    Assessment and Plan: 1. Flu-like symptoms - oseltamivir (TAMIFLU) 75 MG capsule; Take 1 capsule (75 mg total) by mouth 2 (two) times daily.  Dispense: 10 capsule; Refill: 0 - ondansetron (ZOFRAN-ODT) 4 MG disintegrating tablet; Take 1 tablet (4 mg total) by mouth every 8 (eight) hours as needed.  Dispense: 20 tablet; Refill: 0  - Suspect influenza due to symptoms and positive exposure - Tamiflu prescribed - Tessalon perles for cough - Continue OTC medication of choice for symptomatic management - Push fluids - Rest - Seek in person evaluation if symptoms worsen or fail  to improve   Follow Up Instructions: I discussed the assessment and treatment plan with the patient. The patient was provided an opportunity to ask questions and all were answered. The patient agreed with the plan and demonstrated an understanding of the instructions.  A copy of instructions were sent to the patient via MyChart unless otherwise noted below.    The patient was advised to call back or seek an in-person evaluation if the symptoms worsen or if the condition fails to improve as anticipated.  Time:  I spent 8 minutes with the patient via telehealth technology discussing the above problems/concerns.    Mar Daring, PA-C

## 2022-05-31 ENCOUNTER — Encounter: Payer: Self-pay | Admitting: Emergency Medicine

## 2022-05-31 ENCOUNTER — Other Ambulatory Visit: Payer: Self-pay

## 2022-05-31 ENCOUNTER — Telehealth: Payer: Medicaid Other | Admitting: Physician Assistant

## 2022-05-31 ENCOUNTER — Emergency Department
Admission: EM | Admit: 2022-05-31 | Discharge: 2022-05-31 | Disposition: A | Discharge status: Home / Self Care | Payer: Medicaid Other | Attending: Student in an Organized Health Care Education/Training Program | Admitting: Student in an Organized Health Care Education/Training Program

## 2022-05-31 DIAGNOSIS — E039 Hypothyroidism, unspecified: Secondary | ICD-10-CM | POA: Insufficient documentation

## 2022-05-31 DIAGNOSIS — J029 Acute pharyngitis, unspecified: Secondary | ICD-10-CM | POA: Diagnosis not present

## 2022-05-31 DIAGNOSIS — J4520 Mild intermittent asthma, uncomplicated: Secondary | ICD-10-CM | POA: Diagnosis not present

## 2022-05-31 DIAGNOSIS — Z20822 Contact with and (suspected) exposure to covid-19: Secondary | ICD-10-CM | POA: Insufficient documentation

## 2022-05-31 DIAGNOSIS — D72829 Elevated white blood cell count, unspecified: Secondary | ICD-10-CM | POA: Diagnosis not present

## 2022-05-31 DIAGNOSIS — R509 Fever, unspecified: Secondary | ICD-10-CM

## 2022-05-31 DIAGNOSIS — J069 Acute upper respiratory infection, unspecified: Secondary | ICD-10-CM

## 2022-05-31 LAB — URINALYSIS, ROUTINE W REFLEX MICROSCOPIC
Bilirubin Urine: NEGATIVE
Glucose, UA: NEGATIVE mg/dL
Hgb urine dipstick: NEGATIVE
Ketones, ur: 5 mg/dL — AB
Leukocytes,Ua: NEGATIVE
Nitrite: NEGATIVE
Protein, ur: NEGATIVE mg/dL
Specific Gravity, Urine: 1.029 (ref 1.005–1.030)
pH: 6 (ref 5.0–8.0)

## 2022-05-31 LAB — CBC WITH DIFFERENTIAL/PLATELET
Abs Immature Granulocytes: 0.04 10*3/uL (ref 0.00–0.07)
Basophils Absolute: 0 10*3/uL (ref 0.0–0.1)
Basophils Relative: 0 %
Eosinophils Absolute: 0.1 10*3/uL (ref 0.0–0.5)
Eosinophils Relative: 1 %
HCT: 40.4 % (ref 36.0–46.0)
Hemoglobin: 13.3 g/dL (ref 12.0–15.0)
Immature Granulocytes: 0 %
Lymphocytes Relative: 12 %
Lymphs Abs: 1.4 10*3/uL (ref 0.7–4.0)
MCH: 30.3 pg (ref 26.0–34.0)
MCHC: 32.9 g/dL (ref 30.0–36.0)
MCV: 92 fL (ref 80.0–100.0)
Monocytes Absolute: 0.7 10*3/uL (ref 0.1–1.0)
Monocytes Relative: 6 %
Neutro Abs: 9.7 10*3/uL — ABNORMAL HIGH (ref 1.7–7.7)
Neutrophils Relative %: 81 %
Platelets: 219 10*3/uL (ref 150–400)
RBC: 4.39 MIL/uL (ref 3.87–5.11)
RDW: 13 % (ref 11.5–15.5)
WBC: 12 10*3/uL — ABNORMAL HIGH (ref 4.0–10.5)
nRBC: 0 % (ref 0.0–0.2)

## 2022-05-31 LAB — BASIC METABOLIC PANEL
Anion gap: 5 (ref 5–15)
BUN: 17 mg/dL (ref 6–20)
CO2: 26 mmol/L (ref 22–32)
Calcium: 9.2 mg/dL (ref 8.9–10.3)
Chloride: 108 mmol/L (ref 98–111)
Creatinine, Ser: 0.81 mg/dL (ref 0.44–1.00)
GFR, Estimated: >60 mL/min (ref >60)
Glucose, Bld: 98 mg/dL (ref 70–99)
Potassium: 4.2 mmol/L (ref 3.5–5.1)
Sodium: 139 mmol/L (ref 135–145)

## 2022-05-31 LAB — PREGNANCY, URINE: Preg Test, Ur: NEGATIVE

## 2022-05-31 LAB — RESP PANEL BY RT-PCR (RSV, FLU A&B, COVID)  RVPGX2
Influenza A by PCR: NEGATIVE
Influenza B by PCR: NEGATIVE
Resp Syncytial Virus by PCR: NEGATIVE
SARS Coronavirus 2 by RT PCR: NEGATIVE

## 2022-05-31 LAB — GROUP A STREP BY PCR: Group A Strep by PCR: NOT DETECTED

## 2022-05-31 MED ORDER — IBUPROFEN 600 MG PO TABS
600.0000 mg | ORAL_TABLET | Freq: Once | ORAL | Status: AC
  Administered 2022-05-31: 600 mg via ORAL
  Filled 2022-05-31: qty 1

## 2022-05-31 NOTE — ED Provider Notes (Signed)
[facility] Provider Note    Event Date/Time   First MD Initiated Contact with Patient 05/31/22 1514     (approximate)   History   Influenza   HPI  Melanie Bell is a 32 y.o. female with a past medical history of morbid obesity, schizoaffective disorder, borderline personality disorder, PTSD who presents today for evaluation of sore throat,, fever, body aches, dry cough for the past 1 day.  She denies any voice change or trouble swallowing.  She reports that she has painful swallowing.  She reports a very mild dry cough as well.  She denies chest pain or shortness of breath.  She denies abdominal pain, nausea, vomiting, diarrhea, or dysuria.  She denies any known sick contacts.  Patient Active Problem List   Diagnosis Date Noted   Symptomatic cholelithiasis 02/16/2022   Peritoneal adhesions without obstruction 02/16/2022   Hematemesis 01/14/2022   Encounter for supervision of other normal pregnancy, first trimester 11/12/2021   Morbid obesity ([place]) 09/30/2021   Acquired hypothyroidism 06/25/2021   Migraine without aura and without status migrainosus, not intractable 03/05/2021   Mild intermittent asthma without complication 31/49/7026   Gastroesophageal reflux disease 12/01/2020   Hypoglycemia 12/01/2020   Rheumatoid factor positive 12/09/2019   SS-A antibody positive 10/28/2019   Chronic pain of both knees 10/28/2019   Hyperprolactinemia ([place]) 08/02/2019   Schizoaffective disorder, depressive type ([place]) 02/02/2018   Suicide attempt ([place]) 01/16/2018   Dysfunctional uterine bleeding 01/06/2018   Borderline personality disorder ([place]) 01/04/2018   PTSD (post-traumatic stress disorder) 01/03/2018   ASCUS of cervix with negative high risk HPV 08/28/2017   Malingering 05/06/2016          Physical Exam   Triage Vital Signs: ED Triage Vitals  Enc Vitals Group     BP 05/31/22 1410 120/77     Pulse Rate 05/31/22 1410 75     Resp 05/31/22 1410  17     Temp 05/31/22 1410 98.2 F (36.8 C)     Temp Source 05/31/22 1410 Oral     SpO2 05/31/22 1410 99 %     Weight 05/31/22 1411 284 lb (128.8 kg)     Height --      Head Circumference --      Peak Flow --      Pain Score 05/31/22 1411 7     Pain Loc --      Pain Edu? --      Excl. in [place]? --     Most recent vital signs: Vitals:   05/31/22 1410 05/31/22 1723  BP: 120/77 120/78  Pulse: 75 70  Resp: 17 16  Temp: 98.2 F (36.8 C)   SpO2: 99% 99%    Physical Exam Vitals and nursing note reviewed.  Constitutional:      General: Awake and alert. No acute distress.    Appearance: Normal appearance. The patient is obese.  HENT:     Head: Normocephalic and atraumatic.     Mouth: Mucous membranes are moist. Uvula midline.  No tonsillar exudate.  No soft palate fluctuance.  No trismus.  No voice change.  No sublingual swelling.  No tender cervical lymphadenopathy.  No nuchal rigidity Eyes:     General: PERRL. Normal EOMs        Right eye: No discharge.        Left eye: No discharge.     Conjunctiva/sclera: Conjunctivae normal.  Cardiovascular:     Rate and Rhythm: Normal rate and  regular rhythm.     Pulses: Normal pulses.     Heart sounds: Normal heart sounds Pulmonary:     Effort: Pulmonary effort is normal. No respiratory distress.     Breath sounds: Normal breath sounds.  Able to speak in easily complete sentences Abdominal:     Abdomen is soft. There is no abdominal tenderness. No rebound or guarding. No distention. Musculoskeletal:        General: No swelling. Normal range of motion.     Cervical back: Normal range of motion and neck supple.  Skin:    General: Skin is warm and dry.     Capillary Refill: Capillary refill takes less than 2 seconds.     Findings: No rash.  Neurological:     Mental Status: The patient is awake and alert.      ED Results / Procedures / Treatments   Labs (all labs ordered are listed, but only abnormal results are displayed) Labs  Reviewed  CBC WITH DIFFERENTIAL/PLATELET - Abnormal; Notable for the following components:      Result Value   WBC 12.0 (*)    Neutro Abs 9.7 (*)    All other components within normal limits  URINALYSIS, ROUTINE W REFLEX MICROSCOPIC - Abnormal; Notable for the following components:   Color, Urine YELLOW (*)    APPearance CLEAR (*)    Ketones, ur 5 (*)    All other components within normal limits  RESP PANEL BY RT-PCR (RSV, FLU A&B, COVID)  [id number]  GROUP A STREP BY PCR  BASIC METABOLIC PANEL  PREGNANCY, URINE  POC URINE PREG, ED     EKG     RADIOLOGY     PROCEDURES:  Critical Care performed:   Procedures   MEDICATIONS ORDERED IN ED: Medications  ibuprofen (ADVIL) tablet 600 mg (600 mg Oral Given 05/31/22 1650)     IMPRESSION / MDM / ASSESSMENT AND PLAN / ED COURSE  I reviewed the triage vital signs and the nursing notes.   Differential diagnosis includes, but is not limited to, influenza, COVID-19, strep pharyngitis, other URI.  Patient is awake and alert, hemodynamically stable and afebrile.  She demonstrates no acute distress and is nontoxic in appearance.  There is no uvular deviation, trismus, voice change, drooling, neck pain or stiffness to suggest peritonsillar or retropharyngeal abscess.  Labs reveal leukocytosis to 12, similar to 1 month ago.  Strep is negative, as is COVID, flu, RSV.  Her urinalysis is not suggestive of UTI.  No chest pain, SOB, or fever to suggest myocarditis. No abdominal pain or tenderness to suggest intraabdominal infection. She was treated symptomatically with improvement of her symptoms.  Her symptoms of dry cough, sore throat, body aches are suggestive of URI.  We discussed symptomatic management and return precautions.  Patient understands and agrees with plan.  She was discharged in stable condition.   Patient's presentation is most consistent with acute complicated illness / injury requiring diagnostic workup.   FINAL CLINICAL  IMPRESSION(S) / ED DIAGNOSES   Final diagnoses:  Upper respiratory tract infection, unspecified type     Rx / DC Orders   ED Discharge Orders     None        Note:  This document was prepared using Dragon voice recognition software and may include unintentional dictation errors.   Emeline Gins 05/31/22 2032    Merlyn Lot, MD 05/31/22 2111

## 2022-05-31 NOTE — Progress Notes (Signed)
Virtual Visit Consent   Melanie Bell, you are scheduled for a virtual visit with a [facility] provider today. Just as with appointments in the office, your consent must be obtained to participate. Your consent will be active for this visit and any virtual visit you may have with one of our providers in the next 365 days. If you have a MyChart account, a copy of this consent can be sent to you electronically.  As this is a virtual visit, video technology does not allow for your provider to perform a traditional examination. This may limit your provider's ability to fully assess your condition. If your provider identifies any concerns that need to be evaluated in person or the need to arrange testing (such as labs, EKG, etc.), we will make arrangements to do so. Although advances in technology are sophisticated, we cannot ensure that it will always work on either your end or our end. If the connection with a video visit is poor, the visit may have to be switched to a telephone visit. With either a video or telephone visit, we are not always able to ensure that we have a secure connection.  By engaging in this virtual visit, you consent to the provision of healthcare and authorize for your insurance to be billed (if applicable) for the services provided during this visit. Depending on your insurance coverage, you may receive a charge related to this service.  I need to obtain your verbal consent now. Are you willing to proceed with your visit today? Melanie Bell has provided verbal consent on 05/31/2022 for a virtual visit (video or telephone). Leeanne Rio, [place]  Date: 05/31/2022 1:12 PM  Virtual Visit via Video Note   I, Leeanne Rio, connected with  Melanie Bell  ([id number], 1990/07/10) on 05/31/22 at  1:00 PM EST by a video-enabled telemedicine application and verified that I am speaking with the correct person using two identifiers.  Location: Patient: Virtual  Visit Location Patient: Home Provider: Virtual Visit Location Provider: Home Office   I discussed the limitations of evaluation and management by telemedicine and the availability of in person appointments. The patient expressed understanding and agreed to proceed.    History of Present Illness: Melanie Bell is a 32 y.o. who identifies as a female who was assigned female at birth, and is being seen today for abrupt onset of sore throat, aches and chills last night with development of fever overnight that has continued to climb despite tylenol. Just took temperature and currently at 106.1. States she has not been able to hydrate because of sore throat and feeling so bad. Last dose of tylenol was 1 hour ago. States she feels dizzy and lightheaded, occasionally seeing spots. Denies abdominal pain nausea or vomiting. Has no transportation herself and husband is at work currently so is unsure if she needs to call EMS.    HPI: HPI  Problems:  Patient Active Problem List   Diagnosis Date Noted   Symptomatic cholelithiasis 02/16/2022   Peritoneal adhesions without obstruction 02/16/2022   Hematemesis 01/14/2022   Encounter for supervision of other normal pregnancy, first trimester 11/12/2021   Morbid obesity ([place]) 09/30/2021   Acquired hypothyroidism 06/25/2021   Migraine without aura and without status migrainosus, not intractable 03/05/2021   Mild intermittent asthma without complication 59/16/3846   Gastroesophageal reflux disease 12/01/2020   Hypoglycemia 12/01/2020   Rheumatoid factor positive 12/09/2019   SS-A antibody positive 10/28/2019   Chronic pain of both knees  10/28/2019   Hyperprolactinemia ([place]) 08/02/2019   Schizoaffective disorder, depressive type ([place]) 02/02/2018   Suicide attempt ([place]) 01/16/2018   Dysfunctional uterine bleeding 01/06/2018   Borderline personality disorder ([place]) 01/04/2018   PTSD (post-traumatic stress disorder) 01/03/2018   ASCUS of cervix with  negative high risk HPV 08/28/2017   Malingering 05/06/2016    Allergies:  Allergies  Allergen Reactions   Betadine [Povidone Iodine] Other (See Comments)    Reaction:  Unknown    Fish Allergy Hives   Shellfish-Derived Products Other (See Comments)    Reaction:  Unknown    Medications:  Current Outpatient Medications:    albuterol (VENTOLIN HFA) 108 (90 Base) MCG/ACT inhaler, 2 puffs four times a day as needed, Disp: 18 g, Rfl: 1   ARIPiprazole (ABILIFY) 5 MG tablet, Take 5 mg by mouth daily., Disp: , Rfl:    cabergoline (DOSTINEX) 0.5 MG tablet, Take 1 tablet (0.5 mg total) by mouth once a week., Disp: 12 tablet, Rfl: 3   escitalopram (LEXAPRO) 10 MG tablet, Take 10 mg by mouth daily., Disp: , Rfl:    fluticasone-salmeterol (ADVAIR HFA) 45-21 MCG/ACT inhaler, Inhale 2 puffs into the lungs 2 (two) times daily., Disp: 1 each, Rfl: 12   hydrOXYzine (VISTARIL) 50 MG capsule, Take 50 mg by mouth 2 (two) times daily as needed., Disp: , Rfl:    levothyroxine (SYNTHROID) 75 MCG tablet, Take 1 tablet (75 mcg total) by mouth daily before breakfast., Disp: 90 tablet, Rfl: 3   lithium carbonate 300 MG capsule, Take 1 capsule (300 mg total) by mouth 2 (two) times daily with a meal., Disp: 60 capsule, Rfl: 1   magnesium oxide (MAG-OX) 400 MG tablet, Take 400 mg by mouth daily., Disp: , Rfl:    omeprazole (PRILOSEC) 20 MG capsule, TAKE 1 CAPSULE (20 MG TOTAL) BY MOUTH DAILY., Disp: 90 capsule, Rfl: 0   ondansetron (ZOFRAN-ODT) 4 MG disintegrating tablet, Take 1 tablet (4 mg total) by mouth every 8 (eight) hours as needed., Disp: 20 tablet, Rfl: 0   oseltamivir (TAMIFLU) 75 MG capsule, Take 1 capsule (75 mg total) by mouth 2 (two) times daily., Disp: 10 capsule, Rfl: 0   prazosin (MINIPRESS) 2 MG capsule, Take 1 capsule (2 mg total) by mouth at bedtime., Disp: 30 capsule, Rfl: 0   pregabalin (LYRICA) 50 MG capsule, Take 50 mg by mouth 2 (two) times daily., Disp: , Rfl:    SUMAtriptan (IMITREX) 20 MG/ACT  nasal spray, Place 20 mg into the nose as needed., Disp: , Rfl:    topiramate (TOPAMAX) 25 MG tablet, Take 25 mg by mouth 2 (two) times daily., Disp: , Rfl:   Observations/Objective: Patient is well-developed, well-nourished in anxious distress.  She is diaphoretic. Head is normocephalic, atraumatic.  No labored breathing. Speech is clear and coherent with logical content.  Patient is alert and oriented at baseline.   Assessment and Plan: 1. Febrile illness  Abrupt onset. Diagnosed and treated for influenza a couple of weeks ago with Tamiflu. No testing at that time. Has tested for COVID at home this morning and negative per patient report. Alarm symptoms present -- needs ER evaluation. No one home to take her in and she is not safe to drive. EMS called by provider and dispatched to the home. Patient unlocked front door for EMS. Patient contacted husband while provider on phone with EMS.   Follow Up Instructions: I discussed the assessment and treatment plan with the patient. The patient was provided an opportunity to  ask questions and all were answered. The patient agreed with the plan and demonstrated an understanding of the instructions.  A copy of instructions were sent to the patient via MyChart unless otherwise noted below.   The patient was advised to call back or seek an in-person evaluation if the symptoms worsen or if the condition fails to improve as anticipated.  Time:  I spent 15 minutes with the patient via telehealth technology discussing the above problems/concerns.    Leeanne Rio, PA-C

## 2022-05-31 NOTE — ED Triage Notes (Signed)
Pt sts that she was having s/s of the flu. Pt sts that her temp at home was 106.1. Pt sts that she took '1000mg'$  extra strength tylenol.

## 2022-05-31 NOTE — Discharge Instructions (Signed)
Your strep, flu, COVID, RSV test is negative. Please return for any new, worsening, or change in symptoms or other concerns.  It was a pleasure caring for you today.

## 2022-05-31 NOTE — ED Triage Notes (Signed)
First nurse note: Pt to ED via [facility] from home. Pt endorses some nausea. VSS with EMS.

## 2022-06-01 ENCOUNTER — Telehealth: Payer: Self-pay

## 2022-06-01 NOTE — Telephone Encounter (Signed)
Transition Care Management Follow-up Telephone Call Date of discharge and from where: [facility] 05/31/2022 How have you been since you were released from the hospital? Patient is still feeling sick, but resting and trying to recover. Any questions or concerns? No  Items Reviewed: Did the pt receive and understand the discharge instructions provided? Yes  Medications obtained and verified? Yes  Other? No  Any new allergies since your discharge? No  Dietary orders reviewed? Yes Do you have support at home? No   Home Care and Equipment/Supplies: Were home health services ordered? no  Functional Questionnaire: (I = Independent and D = Dependent) ADLs: I  Bathing/Dressing- I  Meal Prep- I  Eating- I  Maintaining continence- I  Transferring/Ambulation- I  Managing Meds- I  Follow up appointments reviewed:  PCP Hospital f/u appt confirmed? No  Patient said she will call if she does not get better and make an appointment. Are transportation arrangements needed? No  If their condition worsens, is the pt aware to call PCP or go to the Emergency Dept.? Yes Was the patient provided with contact information for the PCP's office or ED? Yes Was to pt encouraged to call back with questions or concerns? Yes

## 2022-06-04 ENCOUNTER — Other Ambulatory Visit: Payer: Self-pay

## 2022-06-04 ENCOUNTER — Emergency Department
Admission: EM | Admit: 2022-06-04 | Discharge: 2022-06-04 | Disposition: A | Discharge status: Home / Self Care | Payer: Medicaid Other | Attending: Emergency Medicine | Admitting: Emergency Medicine

## 2022-06-04 ENCOUNTER — Emergency Department: Payer: Medicaid Other

## 2022-06-04 DIAGNOSIS — R0602 Shortness of breath: Secondary | ICD-10-CM | POA: Diagnosis present

## 2022-06-04 MED ORDER — BENZONATATE 100 MG PO CAPS: 100.0000 mg | ORAL_CAPSULE | Freq: Three times a day (TID) | ORAL | 0 refills | Status: DC | PRN

## 2022-06-04 MED ORDER — METHYLPREDNISOLONE SODIUM SUCC 125 MG IJ SOLR
125.0000 mg | Freq: Once | INTRAMUSCULAR | Status: AC
  Administered 2022-06-04: 125 mg via INTRAVENOUS
  Filled 2022-06-04: qty 2

## 2022-06-04 MED ORDER — PREDNISONE 10 MG (21) PO TBPK: ORAL_TABLET | ORAL | 0 refills | Status: DC

## 2022-06-04 NOTE — Discharge Instructions (Signed)
Please seek medical attention for any high fevers, chest pain, shortness of breath, change in behavior, persistent vomiting, bloody stool or any other new or concerning symptoms.  

## 2022-06-04 NOTE — ED Provider Notes (Signed)
[facility] Provider Note    Event Date/Time   First MD Initiated Contact with Patient 06/04/22 1501     (approximate)   History   Shortness of Breath   HPI  Melanie Bell is a 32 y.o. female  who presents to the emergency department today because of concern for shortness of breath and chest pain. She states that she was diagnosed with the flu recently and finished a course of tamiflu. She says that the shortness of breath has continued. It has been accompanied by a cough consistent of green phlegm. She has also had left sided chest pain and feeling a rattling in her chest.  She has had subjective fevers. Denies any recent travel. Denies any swelling or pain in extremities.       Physical Exam   Triage Vital Signs: ED Triage Vitals  Enc Vitals Group     BP 06/04/22 1454 130/60     Pulse Rate 06/04/22 1452 89     Resp 06/04/22 1452 (!) 24     Temp 06/04/22 1452 98.9 F (37.2 C)     Temp Source 06/04/22 1452 Oral     SpO2 06/04/22 1452 99 %     Weight 06/04/22 1449 284 lb (128.8 kg)     Height 06/04/22 1449 '5\' 7"'$  (1.702 m)     Head Circumference --      Peak Flow --      Pain Score 06/04/22 1449 8     Pain Loc --      Pain Edu? --      Excl. in [place]? --     Most recent vital signs: Vitals:   06/04/22 1452 06/04/22 1454  BP:  130/60  Pulse: 89   Resp: (!) 24   Temp: 98.9 F (37.2 C)   SpO2: 99%    General: Awake, alert, oriented. CV:  Good peripheral perfusion. Regular rate and rhythm. Resp:  Normal effort. Lungs clear. No crackles, rattles.  Abd:  No distention.  Other:  No lower extremity edema.    ED Results / Procedures / Treatments   Labs (all labs ordered are listed, but only abnormal results are displayed) Labs Reviewed - No data to display   EKG  I, Nance Pear, attending physician, personally viewed and interpreted this EKG  EKG Time: 1456 Rate: 96 Rhythm: sinus rhythm Axis: normal Intervals: qtc  448 QRS: narrow ST changes: no st elevation Impression: normal ekg   RADIOLOGY I independently interpreted and visualized the CXR. My interpretation: No pneumonia Radiology interpretation:  IMPRESSION:  No active cardiopulmonary disease.      PROCEDURES:  Critical Care performed: No  Procedures   MEDICATIONS ORDERED IN ED: Medications - No data to display   IMPRESSION / MDM / [place] / ED COURSE  I reviewed the triage vital signs and the nursing notes.                              Differential diagnosis includes, but is not limited to, pneumonia, URI  Patient's presentation is most consistent with acute presentation with potential threat to life or bodily function.  Patient presented to the emergency department today because of concern for continued shortness of breath. Patient concerned she might have pneumonia. Has had productive cough. Patient afebrile here. No concerning lung sounds on auscultation. CXR without pneumonia. No hypoxia or tachycardia. No leg swelling. At this time think  likely bronchitis. Will give dose of steroids here. Will give prescription for steroids and cough medication. Discussed return precautions.      FINAL CLINICAL IMPRESSION(S) / ED DIAGNOSES   Final diagnoses:  SOB (shortness of breath)     Note:  This document was prepared using Dragon voice recognition software and may include unintentional dictation errors.    Nance Pear, MD 06/04/22 [phone]

## 2022-06-04 NOTE — ED Triage Notes (Addendum)
Pt brought in via ems for shob. Pt was diagnosed last week with flu and viral URI. Pt states she finished tamiflu several days ago.Pt on room air 98% o2 sat. Pt A&Ox4. NAD.

## 2022-06-07 ENCOUNTER — Other Ambulatory Visit: Payer: Self-pay | Admitting: Internal Medicine

## 2022-06-07 DIAGNOSIS — K219 Gastro-esophageal reflux disease without esophagitis: Secondary | ICD-10-CM

## 2022-06-07 NOTE — Telephone Encounter (Signed)
Requested Prescriptions  Pending Prescriptions Disp Refills   omeprazole (PRILOSEC) 20 MG capsule [Pharmacy Med Name: OMEPRAZOLE 20 MG CPDR 20 Capsule] 90 capsule 2    Sig: TAKE 1 CAPSULE (20 MG TOTAL) BY MOUTH DAILY.     Gastroenterology: Proton Pump Inhibitors Passed - 06/07/2022  3:27 PM      Passed - Valid encounter within last 12 months    Recent Outpatient Visits           4 months ago Missed period   [place] at [facility], Jesse Sans, MD   9 months ago Mild intermittent asthma without complication   [facility] at [facility], Jesse Sans, MD   10 months ago Acute non-recurrent maxillary sinusitis   [place] [facility] at [facility], Jesse Sans, MD   1 year ago Hypoglycemia   [facility] [place] at [facility], Jesse Sans, MD   1 year ago Hypoglycemia   [facility] [place] at [facility], Jesse Sans, MD

## 2022-06-08 ENCOUNTER — Telehealth: Payer: Self-pay

## 2022-06-08 NOTE — Telephone Encounter (Signed)
Transition Care Management Unsuccessful Follow-up Telephone Call  Date of discharge and from where:  06/07/22 [facility] [place]  Attempts:  1st Attempt  Reason for unsuccessful TCM follow-up call:  No answer/busy

## 2022-06-08 NOTE — Telephone Encounter (Signed)
Transition Care Management Unsuccessful Follow-up Telephone Call  Date of discharge and from where:  [facility] 06/07/22  Attempts:  3rd Attempt  Reason for unsuccessful TCM follow-up call:  No answer/busy

## 2022-06-08 NOTE — Telephone Encounter (Signed)
Transition Care Management Unsuccessful Follow-up Telephone Call  Date of discharge and from where:  [facility] 06/07/2022  Attempts:  2nd Attempt  Reason for unsuccessful TCM follow-up call:  No answer/busy

## 2022-06-11 ENCOUNTER — Emergency Department: Payer: Medicaid Other

## 2022-06-11 ENCOUNTER — Other Ambulatory Visit: Payer: Self-pay

## 2022-06-11 DIAGNOSIS — K209 Esophagitis, unspecified without bleeding: Secondary | ICD-10-CM | POA: Diagnosis not present

## 2022-06-11 DIAGNOSIS — R0789 Other chest pain: Secondary | ICD-10-CM | POA: Diagnosis present

## 2022-06-11 LAB — BASIC METABOLIC PANEL
Anion gap: 7 (ref 5–15)
BUN: 25 mg/dL — ABNORMAL HIGH (ref 6–20)
CO2: 24 mmol/L (ref 22–32)
Calcium: 9.1 mg/dL (ref 8.9–10.3)
Chloride: 106 mmol/L (ref 98–111)
Creatinine, Ser: 0.88 mg/dL (ref 0.44–1.00)
GFR, Estimated: >60 mL/min (ref >60)
Glucose, Bld: 96 mg/dL (ref 70–99)
Potassium: 3.7 mmol/L (ref 3.5–5.1)
Sodium: 137 mmol/L (ref 135–145)

## 2022-06-11 LAB — CBC
HCT: 43.2 % (ref 36.0–46.0)
Hemoglobin: 13.9 g/dL (ref 12.0–15.0)
MCH: 29.7 pg (ref 26.0–34.0)
MCHC: 32.2 g/dL (ref 30.0–36.0)
MCV: 92.3 fL (ref 80.0–100.0)
Platelets: 222 10*3/uL (ref 150–400)
RBC: 4.68 MIL/uL (ref 3.87–5.11)
RDW: 13.4 % (ref 11.5–15.5)
WBC: 17.3 10*3/uL — ABNORMAL HIGH (ref 4.0–10.5)
nRBC: 0 % (ref 0.0–0.2)

## 2022-06-11 LAB — TROPONIN I (HIGH SENSITIVITY): Troponin I (High Sensitivity): 3 ng/L (ref <18)

## 2022-06-11 NOTE — ED Triage Notes (Signed)
Pt to ED for some indigestion symptoms. She has HX of same. Takes medications for same. Pt advised it has been going on x 1 hour. Pt is CAOx4 and in no acute distress and ambulatory in triage.

## 2022-06-11 NOTE — ED Triage Notes (Signed)
FIRST NURSE NOTE:  Pt arrived via [facility] c/o GERD has hx of GERD and took meds, states the pain in her chest got worse after dinner.    VSS   127/79  P 67 98% RA

## 2022-06-12 ENCOUNTER — Emergency Department
Admission: EM | Admit: 2022-06-12 | Discharge: 2022-06-12 | Disposition: A | Discharge status: Home / Self Care | Payer: Medicaid Other | Attending: Emergency Medicine | Admitting: Emergency Medicine

## 2022-06-12 DIAGNOSIS — K209 Esophagitis, unspecified without bleeding: Secondary | ICD-10-CM

## 2022-06-12 MED ORDER — ALUM & MAG HYDROXIDE-SIMETH 200-200-20 MG/5ML PO SUSP
30.0000 mL | Freq: Once | ORAL | Status: AC
  Administered 2022-06-12: 30 mL via ORAL
  Filled 2022-06-12: qty 30

## 2022-06-12 MED ORDER — SUCRALFATE 1 G PO TABS: 1.0000 g | ORAL_TABLET | Freq: Four times a day (QID) | ORAL | 0 refills | Status: DC

## 2022-06-12 MED ORDER — LIDOCAINE VISCOUS HCL 2 % MT SOLN
15.0000 mL | Freq: Once | OROMUCOSAL | Status: AC
  Administered 2022-06-12: 15 mL via ORAL
  Filled 2022-06-12: qty 15

## 2022-06-12 NOTE — Discharge Instructions (Signed)
Please seek medical attention for any high fevers, chest pain, shortness of breath, change in behavior, persistent vomiting, bloody stool or any other new or concerning symptoms.  

## 2022-06-12 NOTE — ED Provider Notes (Signed)
[facility] Provider Note    Event Date/Time   First MD Initiated Contact with Patient 06/12/22 [id number]     (approximate)   History   Gastroesophageal Reflux   HPI  Melanie Bell is a 32 y.o. female   who presents to the emergency department today because of concerns for chest pain.  Is located in her central chest.  She describes it as sharp.  Is associated with some nausea.  No associated shortness of breath.  The patient states she does has history of GERD.      Physical Exam   Triage Vital Signs: ED Triage Vitals  Enc Vitals Group     BP 06/11/22 2145 129/63     Pulse Rate 06/11/22 2145 72     Resp 06/11/22 2145 16     Temp 06/11/22 2145 98.1 F (36.7 C)     Temp Source 06/11/22 2145 Oral     SpO2 06/11/22 2145 97 %     Weight 06/11/22 2146 283 lb 15.2 oz (128.8 kg)     Height 06/11/22 2146 '5\' 7"'$  (1.702 m)     Head Circumference --      Peak Flow --      Pain Score 06/11/22 2146 7     Pain Loc --      Pain Edu? --      Excl. in [place]? --     Most recent vital signs: Vitals:   06/11/22 2145  BP: 129/63  Pulse: 72  Resp: 16  Temp: 98.1 F (36.7 C)  SpO2: 97%   General: Awake, alert, oriented. CV:  Good peripheral perfusion. Regular rate and rhythm. Resp:  Normal effort. Lungs clear. Abd:  No distention. Non tender.   ED Results / Procedures / Treatments   Labs (all labs ordered are listed, but only abnormal results are displayed) Labs Reviewed  BASIC METABOLIC PANEL - Abnormal; Notable for the following components:      Result Value   BUN 25 (*)    All other components within normal limits  CBC - Abnormal; Notable for the following components:   WBC 17.3 (*)    All other components within normal limits  POC URINE PREG, ED  TROPONIN I (HIGH SENSITIVITY)     EKG  I, Nance Pear, attending physician, personally viewed and interpreted this EKG  EKG Time: 2152 Rate: 74 Rhythm: normal sinus rhythm Axis:  normal Intervals: qtc 419 QRS: narrow ST changes: no st elevation Impression: normal ekg   RADIOLOGY I independently interpreted and visualized the CXR. My interpretation: No pneumonia Radiology interpretation:  IMPRESSION:  No active cardiopulmonary disease.     PROCEDURES:  Critical Care performed: No  Procedures   MEDICATIONS ORDERED IN ED: Medications - No data to display   IMPRESSION / MDM / [place] / ED COURSE  I reviewed the triage vital signs and the nursing notes.                              Differential diagnosis includes, but is not limited to, acs, pneumonia, pneumothorax, gerd  Patient's presentation is most consistent with acute presentation with potential threat to life or bodily function.  Patient presented to the emergency department today because of concerns for central chest pain.  Patient with history of GERD.  EKG without concerning abnormality.  Chest x-ray without pneumonia.  Patient does have a leukocytosis however I think  likely related to recent steroid prescription.  Patient is afebrile.  I doubt significant infection at this time.  Patient was given GI cocktail and did feel significant improvement.  Do think likely esophagitis and GERD likely.  Will discharge with sucralfate to add on to already prescribed antacid.    FINAL CLINICAL IMPRESSION(S) / ED DIAGNOSES   Final diagnoses:  Esophagitis      Note:  This document was prepared using Dragon voice recognition software and may include unintentional dictation errors.    Nance Pear, MD 06/12/22 [phone]

## 2022-06-12 NOTE — ED Notes (Signed)
ED Provider at bedside. 

## 2022-06-12 NOTE — ED Notes (Signed)
Pt brought to ED rm 17 at this time, this RN now assuming care.

## 2022-06-13 ENCOUNTER — Telehealth: Payer: Self-pay

## 2022-06-13 NOTE — Telephone Encounter (Signed)
Transition Care Management Follow-up Telephone Call Date of discharge and from where: 06/12/22 [facility] How have you been since you were released from the hospital? Patient says she feels a little better but her WBC is rising even with abx. Any questions or concerns? No  Items Reviewed: Did the pt receive and understand the discharge instructions provided? Yes  Medications obtained and verified? Yes  Other? No  Any new allergies since your discharge? No  Dietary orders reviewed? Yes Do you have support at home? Yes   Home Care and Equipment/Supplies: Were home health services ordered? no  Functional Questionnaire: (I = Independent and D = Dependent) ADLs: I  Bathing/Dressing- I  Meal Prep- I  Eating- I  Maintaining continence- I  Transferring/Ambulation- I  Managing Meds- I  Follow up appointments reviewed:  PCP Hospital f/u appt confirmed? Yes  Scheduled to see Dr Army Melia on 06/20/2022 @ 2 PM. Are transportation arrangements needed? Yes  If their condition worsens, is the pt aware to call PCP or go to the Emergency Dept.? Yes Was the patient provided with contact information for the PCP's office or ED? Yes Was to pt encouraged to call back with questions or concerns? Yes

## 2022-06-20 ENCOUNTER — Encounter: Payer: Self-pay | Admitting: Internal Medicine

## 2022-06-20 ENCOUNTER — Ambulatory Visit (INDEPENDENT_AMBULATORY_CARE_PROVIDER_SITE_OTHER): Payer: Medicaid Other | Admitting: Internal Medicine

## 2022-06-20 VITALS — BP 120/70 | HR 78 | Ht 67.0 in | Wt 280.0 lb

## 2022-06-20 DIAGNOSIS — K219 Gastro-esophageal reflux disease without esophagitis: Secondary | ICD-10-CM | POA: Diagnosis not present

## 2022-06-20 DIAGNOSIS — D72829 Elevated white blood cell count, unspecified: Secondary | ICD-10-CM

## 2022-06-20 NOTE — Progress Notes (Signed)
Date:  06/20/2022   Name:  Melanie Bell   DOB:  Nov 28, 1990   MRN:  [id number]   Chief Complaint: Elevated WBC (Wants this rechecked because white count continues to rise. ) Ed visit 06/12/21: Patient presented to the emergency department today because of concerns for central chest pain.  Patient with history of GERD.  EKG without concerning abnormality.  Chest x-ray without pneumonia.  Patient does have a leukocytosis however I think likely related to recent steroid prescription.  Patient is afebrile.  I doubt significant infection at this time.  Patient was given GI cocktail and did feel significant improvement.  Do think likely esophagitis and GERD likely.  Will discharge with sucralfate to add on to already prescribed antacid.   >>> had Normal EGD 01/2022 with Dr. Alice Reichert.  Was vomiting from presumed food poisoning several days before this visit.  Symptoms are much improved on Carafate.  ED visit 06/07/21 [facility]: You were seen in the emergency department today for evaluation of fever and productive cough. Your evaluation is concerning for right lower pneumonia. (CXR report is Normal).For this we are starting you on amoxicillin 1 g three times daily for 5 days. Please take this medication for the full course as prescribed. Please take acetaminophen and ibuprofen for aches and pains as directed on the bottle. Please return to the emergency department if you develop any new or worsening symptoms of chest pain, difficulty breathing, inability tolerate food or fluids, persistent vomiting, or other symptoms that are concerning to you. It was a pleasure take care of you in the emergency department today.  >>> she had a very high WBC but was in the middle of her steroid taper.  She denies any respiratory symptoms, skin lesions, UTI sx or other source of infection.  ED visit 06/04/21: Patient presented to the emergency department today because of concern for continued shortness of breath. Patient concerned  she might have pneumonia. Has had productive cough. Patient afebrile here. No concerning lung sounds on auscultation. CXR without pneumonia. No hypoxia or tachycardia. No leg swelling. At this time think likely bronchitis. Will give dose of steroids here. Will give prescription for steroids and cough medication. Discussed return precautions.    ED visit 05/31/21: Differential diagnosis includes, but is not limited to, influenza, COVID-19, strep pharyngitis, other URI.  Patient is awake and alert, hemodynamically stable and afebrile.  She demonstrates no acute distress and is nontoxic in appearance.  There is no uvular deviation, trismus, voice change, drooling, neck pain or stiffness to suggest peritonsillar or retropharyngeal abscess.  Labs reveal leukocytosis to 12, similar to 1 month ago.  Strep is negative, as is COVID, flu, RSV.  Her urinalysis is not suggestive of UTI.  No chest pain, SOB, or fever to suggest myocarditis. No abdominal pain or tenderness to suggest intraabdominal infection. She was treated symptomatically with improvement of her symptoms.  Her symptoms of dry cough, sore throat, body aches are suggestive of URI.  We discussed symptomatic management and return precautions.  Patient understands and agrees with plan. She was discharged in stable condition.    HPI  Lab Results  Component Value Date   NA 137 06/11/2022   K 3.7 06/11/2022   CO2 24 06/11/2022   GLUCOSE 96 06/11/2022   BUN 25 (H) 06/11/2022   CREATININE 0.88 06/11/2022   CALCIUM 9.1 06/11/2022   GFRNONAA >60 06/11/2022   Lab Results  Component Value Date   CHOL 170 09/30/2021   HDL  35 (L) 09/30/2021   LDLCALC 109 (H) 09/30/2021   TRIG 145 09/30/2021   CHOLHDL 4.9 (H) 09/30/2021   Lab Results  Component Value Date   TSH 1.12 05/02/2022   Lab Results  Component Value Date   HGBA1C 5.1 11/23/2017   Lab Results  Component Value Date   WBC 17.3 (H) 06/11/2022   HGB 13.9 06/11/2022   HCT 43.2 06/11/2022    MCV 92.3 06/11/2022   PLT 222 06/11/2022   Lab Results  Component Value Date   ALT 12 04/26/2022   AST 17 04/26/2022   ALKPHOS 69 04/26/2022   BILITOT 0.6 04/26/2022   No results found for: "25OHVITD2", "25OHVITD3", "VD25OH"   Review of Systems  Constitutional:  Negative for chills, fatigue and fever.  Respiratory:  Negative for cough, chest tightness, shortness of breath and wheezing.   Cardiovascular:  Negative for chest pain and palpitations.  Gastrointestinal:  Negative for abdominal pain, constipation, diarrhea, nausea and vomiting.  Neurological:  Positive for light-headedness and headaches. Negative for dizziness.    Patient Active Problem List   Diagnosis Date Noted   Symptomatic cholelithiasis 02/16/2022   Peritoneal adhesions without obstruction 02/16/2022   Hematemesis 01/14/2022   Encounter for supervision of other normal pregnancy, first trimester 11/12/2021   Morbid obesity ([place]) 09/30/2021   Acquired hypothyroidism 06/25/2021   Migraine without aura and without status migrainosus, not intractable 03/05/2021   Mild intermittent asthma without complication 36/64/4034   Gastroesophageal reflux disease 12/01/2020   Hypoglycemia 12/01/2020   Rheumatoid factor positive 12/09/2019   SS-A antibody positive 10/28/2019   Chronic pain of both knees 10/28/2019   Hyperprolactinemia ([place]) 08/02/2019   Schizoaffective disorder, depressive type ([place]) 02/02/2018   Suicide attempt ([place]) 01/16/2018   Dysfunctional uterine bleeding 01/06/2018   Borderline personality disorder ([place]) 01/04/2018   PTSD (post-traumatic stress disorder) 01/03/2018   ASCUS of cervix with negative high risk HPV 08/28/2017   Malingering 05/06/2016    Allergies  Allergen Reactions   Betadine [Povidone Iodine] Other (See Comments)    Reaction:  Unknown    Fish Allergy Hives   Shellfish-Derived Products Other (See Comments)    Reaction:  Unknown     Past Surgical History:  Procedure  Laterality Date   ABDOMINAL SURGERY     "years ago" to remove foreign objects   APPENDECTOMY     BREAST EXCISIONAL BIOPSY Right 09/03/2007   surgical bx removed fibroadeoma   BREAST LUMPECTOMY Right    COLONOSCOPY WITH PROPOFOL N/A 09/10/2015   Procedure: COLONOSCOPY WITH PROPOFOL;  Surgeon: [name], MD;  Location: [facility] ENDOSCOPY;  Service: Endoscopy;  Laterality: N/A;   ESOPHAGOGASTRODUODENOSCOPY N/A 11/28/2014   Procedure: ESOPHAGOGASTRODUODENOSCOPY (EGD);  Surgeon: [name], MD;  Location[facility] ENDOSCOPY;  Service: Endoscopy;  Laterality: N/A;   ESOPHAGOGASTRODUODENOSCOPY N/A 02/21/2016   Procedure: ESOPHAGOGASTRODUODENOSCOPY (EGD);  Surgeon: Mauri Pole, MD;  Location: [facility] ENDOSCOPY;  Service: Endoscopy;  Laterality: N/A;   ESOPHAGOGASTRODUODENOSCOPY N/A 04/21/2016   Procedure: ESOPHAGOGASTRODUODENOSCOPY (EGD);  Surgeon: Gatha Mayer, MD;  Location: [facility] ENDOSCOPY;  Service: Endoscopy;  Laterality: N/A;   ESOPHAGOGASTRODUODENOSCOPY N/A 05/06/2016   Procedure: ESOPHAGOGASTRODUODENOSCOPY (EGD);  Surgeon: Jonathon Bellows, MD;  Location: [facility] ENDOSCOPY;  Service: Gastroenterology;  Laterality: N/A;   ESOPHAGOGASTRODUODENOSCOPY N/A 06/13/2016   Procedure: ESOPHAGOGASTRODUODENOSCOPY (EGD);  Surgeon: [name], MD;  Location: [facility] ENDOSCOPY;  Service: Endoscopy;  Laterality: N/A;   ESOPHAGOGASTRODUODENOSCOPY N/A 01/14/2022   Procedure: ESOPHAGOGASTRODUODENOSCOPY (EGD);  Surgeon: Toledo, Benay Pike, MD;  Location: [facility] ENDOSCOPY;  Service: Gastroenterology;  Laterality: N/A;   ESOPHAGOGASTRODUODENOSCOPY (EGD) WITH PROPOFOL N/A 02/29/2016   Procedure: ESOPHAGOGASTRODUODENOSCOPY (EGD) WITH PROPOFOL;  Surgeon: [name], MD;  Location: [facility] ENDOSCOPY;  Service: Endoscopy;  Laterality: N/A;   ESOPHAGOGASTRODUODENOSCOPY (EGD) WITH PROPOFOL N/A 01/15/2018   Procedure: ESOPHAGOGASTRODUODENOSCOPY (EGD) WITH PROPOFOL;  Surgeon: Daneil Dolin, MD;  Location: [facility] ENDO SUITE;  Service:  Endoscopy;  Laterality: N/A;  retrieval forein body   LAPAROSCOPIC LYSIS OF ADHESIONS  02/16/2022   Procedure: LAPAROSCOPIC LYSIS OF ADHESIONS;  Surgeon: Olean Ree, MD;  Location: [facility] ORS;  Service: General;;   LAPAROTOMY N/A 09/12/2015   Procedure: EXPLORATORY LAPAROTOMY;  Surgeon: Florene Glen, MD;  Location: [facility] ORS;  Service: General;  Laterality: N/A;   SIGMOIDOSCOPY N/A 09/12/2015   Procedure: Lonell Face;  Surgeon: Florene Glen, MD;  Location: [facility] ORS;  Service: General;  Laterality: N/A;   WISDOM TOOTH EXTRACTION      Social History   Tobacco Use   Smoking status: Former    Packs/day: 0.50    Years: 3.00    Total pack years: 1.50    Types: Cigarettes    Quit date: 08/14/2016    Years since quitting: 5.8    Passive exposure: Past   Smokeless tobacco: Never  Vaping Use   Vaping Use: Never used  Substance Use Topics   Alcohol use: No   Drug use: No     Medication list has been reviewed and updated.  Current Meds  Medication Sig   albuterol (VENTOLIN HFA) 108 (90 Base) MCG/ACT inhaler 2 puffs four times a day as needed   ARIPiprazole (ABILIFY) 5 MG tablet Take 5 mg by mouth daily.   cabergoline (DOSTINEX) 0.5 MG tablet Take 1 tablet (0.5 mg total) by mouth once a week.   escitalopram (LEXAPRO) 10 MG tablet Take 10 mg by mouth daily.   fluticasone-salmeterol (ADVAIR HFA) 45-21 MCG/ACT inhaler Inhale 2 puffs into the lungs 2 (two) times daily.   hydrOXYzine (VISTARIL) 50 MG capsule Take 50 mg by mouth 2 (two) times daily as needed.   levothyroxine (SYNTHROID) 75 MCG tablet Take 1 tablet (75 mcg total) by mouth daily before breakfast.   lithium carbonate 300 MG capsule Take 1 capsule (300 mg total) by mouth 2 (two) times daily with a meal.   magnesium oxide (MAG-OX) 400 MG tablet Take 400 mg by mouth daily.   omeprazole (PRILOSEC) 20 MG capsule TAKE 1 CAPSULE (20 MG TOTAL) BY MOUTH DAILY.   ondansetron (ZOFRAN-ODT) 4 MG disintegrating tablet Take 1 tablet  (4 mg total) by mouth every 8 (eight) hours as needed.   prazosin (MINIPRESS) 2 MG capsule Take 1 capsule (2 mg total) by mouth at bedtime.   pregabalin (LYRICA) 50 MG capsule Take 50 mg by mouth 2 (two) times daily.   sucralfate (CARAFATE) 1 g tablet Take 1 tablet (1 g total) by mouth 4 (four) times daily.   SUMAtriptan (IMITREX) 20 MG/ACT nasal spray Place 20 mg into the nose as needed.   topiramate (TOPAMAX) 25 MG tablet Take 25 mg by mouth 2 (two) times daily.       06/20/2022    1:51 PM 01/12/2022    3:01 PM 09/03/2021    9:15 AM 03/05/2021    9:04 AM  GAD 7 : Generalized Anxiety Score  Nervous, Anxious, on Edge 1 0 0 0  Control/stop worrying 0 0 0 0  Worry too much - different things 0 0 0 0  Trouble relaxing 0  0 0 0  Restless 0 0 0 0  Easily annoyed or irritable 0 0 0 0  Afraid - awful might happen 0 0 0 0  Total GAD 7 Score 1 0 0 0  Anxiety Difficulty Not difficult at all Not difficult at all  Not difficult at all       06/20/2022    1:51 PM 01/12/2022    3:00 PM 01/05/2022    3:42 PM  Depression screen PHQ 2/9  Decreased Interest 0 0 0  Down, Depressed, Hopeless 0 0 0  PHQ - 2 Score 0 0 0  Altered sleeping 1 1 0  Tired, decreased energy 0 1 0  Change in appetite 1 0 0  Feeling bad or failure about yourself  0 0 0  Trouble concentrating 0 0 0  Moving slowly or fidgety/restless 0 0 0  Suicidal thoughts 0 0 0  PHQ-9 Score 2 2 0  Difficult doing work/chores Not difficult at all Not difficult at all Not difficult at all    BP Readings from Last 3 Encounters:  06/20/22 120/70  06/12/22 (!) 103/56  06/04/22 122/70    Physical Exam Vitals and nursing note reviewed.  Constitutional:      General: She is not in acute distress.    Appearance: Normal appearance. She is well-developed.  HENT:     Head: Normocephalic and atraumatic.  Cardiovascular:     Rate and Rhythm: Normal rate and regular rhythm.     Pulses: Normal pulses.     Heart sounds: No murmur  heard. Pulmonary:     Effort: Pulmonary effort is normal. No respiratory distress.     Breath sounds: No wheezing or rhonchi.  Musculoskeletal:     Cervical back: Normal range of motion.     Right lower leg: No edema.     Left lower leg: No edema.  Lymphadenopathy:     Cervical: No cervical adenopathy.  Skin:    General: Skin is warm and dry.     Findings: No rash.  Neurological:     General: No focal deficit present.     Mental Status: She is alert and oriented to person, place, and time.  Psychiatric:        Mood and Affect: Mood normal.        Behavior: Behavior normal.     Wt Readings from Last 3 Encounters:  06/20/22 280 lb (127 kg)  06/11/22 283 lb 15.2 oz (128.8 kg)  06/04/22 284 lb (128.8 kg)    BP 120/70   Pulse 78   Ht '5\' 7"'$  (1.702 m)   Wt 280 lb (127 kg)   LMP 06/19/2022 (Exact Date)   SpO2 97%   BMI 43.85 kg/m   Assessment and Plan: Problem List Items Addressed This Visit       Digestive   Gastroesophageal reflux disease - Primary (Chronic)    Symptoms well controlled on daily PPI - improved on Carafate No red flag signs such as weight loss, n/v, melena Will continue omeprazole and Carafate for 30 days Follow up if symptoms recur       Other Visit Diagnoses     Leukocytosis, unspecified type       likely due to steroid taper will repeat and if still elevated, consider Hematology referral   Relevant Orders   CBC with Differential/Platelet        Partially dictated using Dragon software. Any errors are unintentional.  Halina Maidens, MD [facility]  06/20/2022

## 2022-06-20 NOTE — Assessment & Plan Note (Addendum)
Symptoms well controlled on daily PPI - improved on Carafate No red flag signs such as weight loss, n/v, melena Will continue omeprazole and Carafate for 30 days Follow up if symptoms recur

## 2022-06-21 ENCOUNTER — Encounter: Payer: Self-pay | Admitting: Emergency Medicine

## 2022-06-21 ENCOUNTER — Emergency Department: Payer: Medicaid Other

## 2022-06-21 ENCOUNTER — Emergency Department
Admission: EM | Admit: 2022-06-21 | Discharge: 2022-06-21 | Disposition: A | Discharge status: Home / Self Care | Payer: Medicaid Other | Attending: Student in an Organized Health Care Education/Training Program | Admitting: Student in an Organized Health Care Education/Training Program

## 2022-06-21 ENCOUNTER — Other Ambulatory Visit: Payer: Self-pay

## 2022-06-21 DIAGNOSIS — W19XXXA Unspecified fall, initial encounter: Secondary | ICD-10-CM | POA: Diagnosis not present

## 2022-06-21 DIAGNOSIS — Y9301 Activity, walking, marching and hiking: Secondary | ICD-10-CM | POA: Diagnosis not present

## 2022-06-21 DIAGNOSIS — J45909 Unspecified asthma, uncomplicated: Secondary | ICD-10-CM | POA: Diagnosis not present

## 2022-06-21 DIAGNOSIS — M25562 Pain in left knee: Secondary | ICD-10-CM | POA: Diagnosis present

## 2022-06-21 LAB — CBC WITH DIFFERENTIAL/PLATELET
Basophils Absolute: 0.1 10*3/uL (ref 0.0–0.2)
Basos: 1 %
EOS (ABSOLUTE): 0.1 10*3/uL (ref 0.0–0.4)
Eos: 1 %
Hematocrit: 39.9 % (ref 34.0–46.6)
Hemoglobin: 13.3 g/dL (ref 11.1–15.9)
Immature Grans (Abs): 0 10*3/uL (ref 0.0–0.1)
Immature Granulocytes: 0 %
Lymphocytes Absolute: 1.9 10*3/uL (ref 0.7–3.1)
Lymphs: 20 %
MCH: 30.1 pg (ref 26.6–33.0)
MCHC: 33.3 g/dL (ref 31.5–35.7)
MCV: 90 fL (ref 79–97)
Monocytes Absolute: 0.6 10*3/uL (ref 0.1–0.9)
Monocytes: 7 %
Neutrophils Absolute: 6.7 10*3/uL (ref 1.4–7.0)
Neutrophils: 71 %
Platelets: 208 10*3/uL (ref 150–450)
RBC: 4.42 x10E6/uL (ref 3.77–5.28)
RDW: 12.8 % (ref 11.7–15.4)
WBC: 9.4 10*3/uL (ref 3.4–10.8)

## 2022-06-21 MED ORDER — MELOXICAM 15 MG PO TABS: 15.0000 mg | ORAL_TABLET | Freq: Every day | ORAL | 11 refills | Status: DC

## 2022-06-21 MED ORDER — MELOXICAM 7.5 MG PO TABS
15.0000 mg | ORAL_TABLET | Freq: Once | ORAL | Status: AC
  Administered 2022-06-21: 15 mg via ORAL
  Filled 2022-06-21: qty 2

## 2022-06-21 NOTE — Discharge Instructions (Addendum)
I suspect that you likely sprained your knee.  Fortunately, your x-rays not show any evidence of fractures.  Based on my examination, there is not appear to be any significant soft tissue injuries.  I recommend rest, ice, compression, and elevation as tolerated.  You may take meloxicam as needed for pain.  You may wear the knee brace as needed for comfort.  -If your symptoms fail to improve despite treatment, please follow-up with your orthopedist as discussed.  -Return to the emergency department anytime if you begin to experience any new or worsening symptoms.

## 2022-06-21 NOTE — ED Triage Notes (Signed)
Pt presents via [facility] from home with L knee pain following a fall landing on her knee this evening. Pt states the fall occurred around 1600. Hx of arthritis in her knees. Denies LOC, CP or SOB.

## 2022-06-21 NOTE — ED Provider Notes (Signed)
[facility] Provider Note    Event Date/Time   First MD Initiated Contact with Patient 06/21/22 2104     (approximate)   History   Chief Complaint Knee Pain   HPI Melanie Bell is a 32 y.o. female, history of PTSD, borderline personality disorders, schizoaffective disorder, asthma, GERD, presents to the emergency department for evaluation of left knee pain.  She states that she was walking when her "knee gave out", causing her to strike her patella on the ground.  Reports difficulty with ambulation since.  Denies any other injuries.  Denies fever/chills, hip pain, lower leg pain, ankle pain, paresthesias, cold sensation, chest pain, shortness of breath, abdominal pain, nausea/vomiting, process lesions, or dizziness/lightheadedness.  History Limitations: No limitations.        Physical Exam  Triage Vital Signs: ED Triage Vitals  Enc Vitals Group     BP 06/21/22 2100 120/63     Pulse Rate 06/21/22 2100 67     Resp 06/21/22 2100 18     Temp 06/21/22 2100 98.4 F (36.9 C)     Temp Source 06/21/22 2100 Oral     SpO2 06/21/22 2053 98 %     Weight 06/21/22 2058 280 lb (127 kg)     Height 06/21/22 2058 5' 7"$  (1.702 m)     Head Circumference --      Peak Flow --      Pain Score 06/21/22 2058 7     Pain Loc --      Pain Edu? --      Excl. in [place]? --     Most recent vital signs: Vitals:   06/21/22 2100 06/21/22 2250  BP: 120/63 (!) 105/54  Pulse: 67 68  Resp: 18 18  Temp: 98.4 F (36.9 C)   SpO2: 100% 100%    General: Awake, NAD.  Skin: Warm, dry. No rashes or lesions.  Eyes: PERRL. Conjunctivae normal.  CV: Good peripheral perfusion.  Resp: Normal effort.  Abd: Soft, non-tender. No distention.  Neuro: At baseline. No gross neurological deficits.  Musculoskeletal: Normal ROM of all extremities.  Focused Exam: No gross deformities to the left lower extremity.  Normal range of motion at the knee joint including flexion extension, the  patient does endorse some discomfort.  PMS intact distally.  Negative anterior/posterior drawer.  Mild pain diffusely with valgus/varus maneuvering, though there does not appear to be any joint laxity.  She is still able to ambulate on her own without assistance.  No tenderness along the hip, femur, or tibia/fibula.  Physical Exam    ED Results / Procedures / Treatments  Labs (all labs ordered are listed, but only abnormal results are displayed) Labs Reviewed - No data to display   EKG N/A.    RADIOLOGY  ED Provider Interpretation: I personally viewed and interpreted this x-ray, no evidence of acute osseous abnormalities.  DG Knee Complete 4 Views Left  Result Date: 06/21/2022 CLINICAL DATA:  Pain and swelling EXAM: LEFT KNEE - COMPLETE 4+ VIEW COMPARISON:  None Available. FINDINGS: No fracture or malalignment. No significant knee effusion. The joint spaces appear patent. Minimal medial and patellofemoral degenerative change. Two adjacent linear metallic wire like fragments in the proximal lower leg measuring 2 cm and 0.6 cm within the posterior medial soft tissues of the proximal lower leg. IMPRESSION: 1. No acute osseous abnormality. Minimal degenerative changes. 2. Two adjacent metallic wire like fragments/foreign bodies in the proximal lower leg soft tissues. Electronically Signed  By: Donavan Foil M.D.   On: 06/21/2022 21:52    PROCEDURES:  Critical Care performed: N/A.  Procedures    MEDICATIONS ORDERED IN ED: Medications  meloxicam (MOBIC) tablet 15 mg (15 mg Oral Given 06/21/22 2247)     IMPRESSION / MDM / ASSESSMENT AND PLAN / ED COURSE  I reviewed the triage vital signs and the nursing notes.                              Differential diagnosis includes, but is not limited to, knee sprain, patella fracture, ACL/PCL injury, meniscus injury, MCL/LCL injury.  Assessment/Plan Patient presents with left knee pain x 1 day after striking her left knee onto the ground.   Physical exam does not show any major soft tissue injuries.  X-rays not show any acute abnormalities.  It did show 2 adjacent metallic wire like fragments in the proximal lower leg, which patient states has been there for several years and occurred after she actually got bobby pins stuck in her leg.  No signs of infection around the reported site.  No further workup or treatment needed.  In regards to her knee, I suspect that she likely endured a contusion along the patella versus knee sprain.  She still able to ambulate well on her own.  Will provide her with a knee brace, as well as a prescription for meloxicam for pain.  Advised her to follow with her orthopedist if her symptoms fail to improve after a few weeks.  She was amenable to this.  Will discharge.  Provided the patient with anticipatory guidance, return precautions, and educational material. Encouraged the patient to return to the emergency department at any time if they begin to experience any new or worsening symptoms. Patient expressed understanding and agreed with the plan.   Patient's presentation is most consistent with acute complicated illness / injury requiring diagnostic workup.       FINAL CLINICAL IMPRESSION(S) / ED DIAGNOSES   Final diagnoses:  Acute pain of left knee     Rx / DC Orders   ED Discharge Orders          Ordered    meloxicam (MOBIC) 15 MG tablet  Daily        06/21/22 2237             Note:  This document was prepared using Dragon voice recognition software and may include unintentional dictation errors.   Teodoro Spray, [place] 06/21/22 2359    Merlyn Lot, MD 06/24/22 1235

## 2022-06-21 NOTE — ED Notes (Addendum)
Xxl knee splint applied to Left knee with instructions and return demonstration

## 2022-06-21 NOTE — ED Triage Notes (Signed)
EMS brings pt in from home for fall onto left knee

## 2022-06-22 ENCOUNTER — Telehealth: Payer: Self-pay

## 2022-06-22 NOTE — Telephone Encounter (Signed)
Transition Care Management Follow-up Telephone Call Date of discharge and from where: 06/21/22 [facility] How have you been since you were released from the hospital? [place] and Hurt Left Knee/ Sprained and Sore- Wearing knee brace Any questions or concerns? No  Items Reviewed: Did the pt receive and understand the discharge instructions provided? Yes  Medications obtained and verified? Yes  Other? No  Any new allergies since your discharge? No  Dietary orders reviewed? Yes Do you have support at home? Yes   Home Care and Equipment/Supplies: Were home health services ordered? no  Functional Questionnaire: (I = Independent and D = Dependent) ADLs: I  Bathing/Dressing- I  Meal Prep- I  Eating- I  Maintaining continence- I  Transferring/Ambulation- I  Managing Meds- I  Follow up appointments reviewed:  PCP Hospital f/u appt confirmed? No. Will see an orthopedic doctor [facility] f/u appt confirmed?  Will contact orthopedics [facility] for appointment Are transportation arrangements needed? No. If their condition worsens, is the pt aware to call PCP or go to the Emergency Dept.? Yes Was the patient provided with contact information for the PCP's office or ED? Yes Was to pt encouraged to call back with questions or concerns? Yes

## 2022-06-27 ENCOUNTER — Other Ambulatory Visit: Payer: Self-pay | Admitting: Student

## 2022-06-27 DIAGNOSIS — R55 Syncope and collapse: Secondary | ICD-10-CM

## 2022-06-28 ENCOUNTER — Emergency Department
Admission: EM | Admit: 2022-06-28 | Discharge: 2022-06-28 | Disposition: A | Discharge status: Home / Self Care | Payer: Medicaid Other | Attending: Emergency Medicine | Admitting: Emergency Medicine

## 2022-06-28 ENCOUNTER — Emergency Department: Payer: Medicaid Other

## 2022-06-28 DIAGNOSIS — R1084 Generalized abdominal pain: Secondary | ICD-10-CM | POA: Insufficient documentation

## 2022-06-28 DIAGNOSIS — I10 Essential (primary) hypertension: Secondary | ICD-10-CM | POA: Insufficient documentation

## 2022-06-28 DIAGNOSIS — R112 Nausea with vomiting, unspecified: Secondary | ICD-10-CM | POA: Diagnosis not present

## 2022-06-28 DIAGNOSIS — R1011 Right upper quadrant pain: Secondary | ICD-10-CM | POA: Diagnosis present

## 2022-06-28 LAB — CBC WITH DIFFERENTIAL/PLATELET
Abs Immature Granulocytes: 0.02 10*3/uL (ref 0.00–0.07)
Basophils Absolute: 0.1 10*3/uL (ref 0.0–0.1)
Basophils Relative: 1 %
Eosinophils Absolute: 0.1 10*3/uL (ref 0.0–0.5)
Eosinophils Relative: 2 %
HCT: 38.6 % (ref 36.0–46.0)
Hemoglobin: 12.5 g/dL (ref 12.0–15.0)
Immature Granulocytes: 0 %
Lymphocytes Relative: 30 %
Lymphs Abs: 2.6 10*3/uL (ref 0.7–4.0)
MCH: 30.3 pg (ref 26.0–34.0)
MCHC: 32.4 g/dL (ref 30.0–36.0)
MCV: 93.7 fL (ref 80.0–100.0)
Monocytes Absolute: 0.6 10*3/uL (ref 0.1–1.0)
Monocytes Relative: 7 %
Neutro Abs: 5.2 10*3/uL (ref 1.7–7.7)
Neutrophils Relative %: 60 %
Platelets: 207 10*3/uL (ref 150–400)
RBC: 4.12 MIL/uL (ref 3.87–5.11)
RDW: 13.1 % (ref 11.5–15.5)
WBC: 8.6 10*3/uL (ref 4.0–10.5)
nRBC: 0 % (ref 0.0–0.2)

## 2022-06-28 LAB — COMPREHENSIVE METABOLIC PANEL
ALT: 14 U/L (ref 0–44)
AST: 17 U/L (ref 15–41)
Albumin: 3.9 g/dL (ref 3.5–5.0)
Alkaline Phosphatase: 63 U/L (ref 38–126)
Anion gap: 6 (ref 5–15)
BUN: 17 mg/dL (ref 6–20)
CO2: 26 mmol/L (ref 22–32)
Calcium: 9.5 mg/dL (ref 8.9–10.3)
Chloride: 106 mmol/L (ref 98–111)
Creatinine, Ser: 0.76 mg/dL (ref 0.44–1.00)
GFR, Estimated: >60 mL/min (ref >60)
Glucose, Bld: 91 mg/dL (ref 70–99)
Potassium: 4 mmol/L (ref 3.5–5.1)
Sodium: 138 mmol/L (ref 135–145)
Total Bilirubin: 0.6 mg/dL (ref 0.3–1.2)
Total Protein: 7.1 g/dL (ref 6.5–8.1)

## 2022-06-28 LAB — URINALYSIS, ROUTINE W REFLEX MICROSCOPIC
Bilirubin Urine: NEGATIVE
Glucose, UA: NEGATIVE mg/dL
Hgb urine dipstick: NEGATIVE
Ketones, ur: NEGATIVE mg/dL
Leukocytes,Ua: NEGATIVE
Nitrite: NEGATIVE
Protein, ur: NEGATIVE mg/dL
Specific Gravity, Urine: 1.013 (ref 1.005–1.030)
pH: 7 (ref 5.0–8.0)

## 2022-06-28 LAB — LIPASE, BLOOD: Lipase: 42 U/L (ref 11–51)

## 2022-06-28 LAB — POC URINE PREG, ED: Preg Test, Ur: NEGATIVE

## 2022-06-28 MED ORDER — ONDANSETRON HCL 4 MG PO TABS: 4.0000 mg | ORAL_TABLET | Freq: Every day | ORAL | 1 refills | Status: DC | PRN

## 2022-06-28 MED ORDER — ONDANSETRON HCL 4 MG/2ML IJ SOLN
4.0000 mg | Freq: Once | INTRAMUSCULAR | Status: AC
  Administered 2022-06-28: 4 mg via INTRAVENOUS
  Filled 2022-06-28: qty 2

## 2022-06-28 MED ORDER — IOHEXOL 350 MG/ML SOLN
100.0000 mL | Freq: Once | INTRAVENOUS | Status: AC | PRN
  Administered 2022-06-28: 100 mL via INTRAVENOUS

## 2022-06-28 NOTE — Discharge Instructions (Signed)
Take zofran for nausea as needed.  Drink plenty of fluids to stay well hydrated.    Thank you for choosing [name] for your health care today!  Please see your primary doctor this week for a follow up appointment.   Sometimes, in the early stages of certain disease courses it is difficult to detect in the emergency department evaluation -- so, it is important that you continue to monitor your symptoms and call your doctor right away or return to the emergency department if you develop any new or worsening symptoms.  Please go to the following website to schedule new (and existing) patient appointments:   [email or web address]  If you do not have a primary doctor try calling the following clinics to establish care:  If you have insurance:  [facility] [phone] [place] [address]   [facility]  [phone] [address] [place] [address]   If you do not have insurance:  [facility]  [phone] [address] [name] [address]   The following is another list of primary care offices in the area who are accepting new patients at this time.  Please reach out to one of them directly and let them know you would like to schedule an appointment to follow up on an Emergency Department visit, and/or to establish a new primary care provider (PCP).  There are likely other primary care clinics in the are who are accepting new patients, but this is an excellent place to start:  [place] physician: Dr Lavon Paganini [address] [place], [place] [address] [phone]  [facility] Lead Physician: Dr Steele Sizer [address], [place], [name] [address] [phone]  [place] Physician: Dr Park Liter [address] [place], [place] [address] [phone]  [facility] Lead Physician: Dr Dewaine Oats [place], [place], [place] [address] [phone]  [place] [facility] [place] [facility] Physician: Dr Halina Maidens [address] [place], [place] [address] [phone]   It was my pleasure to care for you today.   [place] Jacelyn Grip, MD

## 2022-06-28 NOTE — ED Triage Notes (Addendum)
Patient C/O right upper quadrant abdominal pain 10/10 that began around 1630 today. Patient does no longer has her gallbladder or appendix of note. She states that there is a possibility of pregnancy at this time, but last period ended on 06/19/22 exactly per patient. Patient states she did have some vomiting today. Denies fevers, urinary symptoms, or history of kidney stones. Does have history of GERD.
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
## 2022-06-29 ENCOUNTER — Telehealth: Payer: Self-pay

## 2022-06-29 NOTE — Transitions of Care (Post Inpatient/ED Visit) (Unsigned)
   06/29/2022  [name] MRN: [id number] DOB: 1990/07/23  Today's TOC FU Call Status: Today's TOC FU Call Status:: Unsuccessul Call (1st Attempt) Unsuccessful Call (1st Attempt) Date: 06/29/22  Attempted to reach the patient regarding the most recent Inpatient/ED visit.  Follow Up Plan: Additional outreach attempts will be made to reach the patient to complete the Transitions of Care (Post Inpatient/ED visit) call.   [place] LPN [place] Advisor Direct Dial [phone]

## 2022-06-30 NOTE — Transitions of Care (Post Inpatient/ED Visit) (Signed)
   06/30/2022  [name] MRN: [id number] DOB: 1990-08-24  Today's TOC FU Call Status: Today's TOC FU Call Status:: Unsuccessful Call (2nd Attempt) Unsuccessful Call (1st Attempt) Date: 06/29/22 Unsuccessful Call (2nd Attempt) Date: 06/30/22  Attempted to reach the patient regarding the most recent Inpatient/ED visit.  Follow Up Plan: No further outreach attempts will be made at this time. We have been unable to contact the patient.  Juanda Crumble LPN [facility] Nurse Health Advisor Direct Dial [phone]  Signature

## 2022-07-07 ENCOUNTER — Emergency Department
Admission: EM | Admit: 2022-07-07 | Discharge: 2022-07-07 | Disposition: A | Discharge status: Home / Self Care | Payer: Medicaid Other | Attending: Emergency Medicine | Admitting: Emergency Medicine

## 2022-07-07 DIAGNOSIS — J4521 Mild intermittent asthma with (acute) exacerbation: Secondary | ICD-10-CM | POA: Diagnosis not present

## 2022-07-07 DIAGNOSIS — R0602 Shortness of breath: Secondary | ICD-10-CM | POA: Diagnosis present

## 2022-07-07 DIAGNOSIS — U071 COVID-19: Secondary | ICD-10-CM | POA: Diagnosis not present

## 2022-07-07 LAB — RESP PANEL BY RT-PCR (RSV, FLU A&B, COVID)  RVPGX2
Influenza A by PCR: NEGATIVE
Influenza B by PCR: NEGATIVE
Resp Syncytial Virus by PCR: NEGATIVE
SARS Coronavirus 2 by RT PCR: NEGATIVE

## 2022-07-07 MED ORDER — IPRATROPIUM-ALBUTEROL 0.5-2.5 (3) MG/3ML IN SOLN
6.0000 mL | Freq: Once | RESPIRATORY_TRACT | Status: AC
  Administered 2022-07-07: 6 mL via RESPIRATORY_TRACT
  Filled 2022-07-07: qty 6

## 2022-07-07 MED ORDER — PSEUDOEPH-BROMPHEN-DM 30-2-10 MG/5ML PO SYRP: 10.0000 mL | ORAL_SOLUTION | Freq: Four times a day (QID) | ORAL | 0 refills | Status: DC | PRN

## 2022-07-07 MED ORDER — ONDANSETRON HCL 4 MG/2ML IJ SOLN
4.0000 mg | Freq: Once | INTRAMUSCULAR | Status: AC
  Administered 2022-07-07: 4 mg via INTRAVENOUS
  Filled 2022-07-07: qty 2

## 2022-07-07 MED ORDER — ONDANSETRON 4 MG PO TBDP: 4.0000 mg | ORAL_TABLET | Freq: Three times a day (TID) | ORAL | 0 refills | Status: DC | PRN

## 2022-07-07 MED ORDER — METHYLPREDNISOLONE SODIUM SUCC 125 MG IJ SOLR
125.0000 mg | Freq: Once | INTRAMUSCULAR | Status: AC
  Administered 2022-07-07: 125 mg via INTRAVENOUS
  Filled 2022-07-07: qty 2

## 2022-07-07 MED ORDER — BENZONATATE 100 MG PO CAPS: 100.0000 mg | ORAL_CAPSULE | Freq: Three times a day (TID) | ORAL | 0 refills | Status: DC | PRN

## 2022-07-07 MED ORDER — PREDNISONE 50 MG PO TABS: 50.0000 mg | ORAL_TABLET | Freq: Every day | ORAL | 0 refills | Status: DC

## 2022-07-07 NOTE — ED Provider Notes (Signed)
[facility] Provider Note  Patient Contact: 6:16 PM (approximate)   History   Covid Positive and Fatigue   HPI  Melanie Bell is a 32 y.o. female who presents the emergency department complaining of shortness of breath, asthma exacerbation and positive home COVID test.  Patient states that she was exposed to somebody with COVID, did not realize that at the time it became symptomatic.  She has had congestion, cough, wheezing, nausea, emesis, diarrhea.  Patient is here primarily for her increased asthma symptoms.  She has been using her albuterol at home feels like her shortness of breath returns shortly after using her albuterol.  No chest pain.  No abdominal pain.  Again she took a home COVID test which was positive.     Physical Exam   Triage Vital Signs: ED Triage Vitals  Enc Vitals Group     BP 07/07/22 1724 121/63     Pulse Rate 07/07/22 1724 77     Resp 07/07/22 1724 20     Temp 07/07/22 1724 98.5 F (36.9 C)     Temp src --      SpO2 07/07/22 1721 96 %     Weight 07/07/22 1726 279 lb 15.8 oz (127 kg)     Height 07/07/22 1726 '5\' 7"'$  (1.702 m)     Head Circumference --      Peak Flow --      Pain Score 07/07/22 1727 6     Pain Loc --      Pain Edu? --      Excl. in [place]? --     Most recent vital signs: Vitals:   07/07/22 1721 07/07/22 1724  BP:  121/63  Pulse:  77  Resp:  20  Temp:  98.5 F (36.9 C)  SpO2: 96% 96%     General: Alert and in no acute distress ENT:      Ears: EACs and TMs unremarkable      Nose: No congestion/rhinnorhea.      Mouth/Throat: Mucous membranes are moist.  No gross oropharyngeal erythema or edema noted  Neck: No stridor. No cervical spine tenderness to palpation.  Cardiovascular:  Good peripheral perfusion Respiratory: Normal respiratory effort without tachypnea or retractions. Lungs some expiratory wheezing.  No expiratory wheezing.  No rales or rhonchi.Kermit Balo air entry to the bases with no decreased  or absent breath sounds Gastrointestinal: Bowel sounds 4 quadrants. Soft and nontender to palpation. No guarding or rigidity. No palpable masses. No distention. No CVA tenderness. Musculoskeletal: Full range of motion to all extremities.  Neurologic:  No gross focal neurologic deficits are appreciated.  Skin:   No rash noted Other:   ED Results / Procedures / Treatments   Labs (all labs ordered are listed, but only abnormal results are displayed) Labs Reviewed  RESP PANEL BY RT-PCR (RSV, FLU A&B, COVID)  [id number]     EKG     RADIOLOGY    No results found.  PROCEDURES:  Critical Care performed: No  Procedures   MEDICATIONS ORDERED IN ED: Medications  ipratropium-albuterol (DUONEB) 0.5-2.5 (3) MG/3ML nebulizer solution 6 mL (6 mLs Nebulization Given 07/07/22 1835)  methylPREDNISolone sodium succinate (SOLU-MEDROL) 125 mg/2 mL injection 125 mg (125 mg Intravenous Given 07/07/22 1834)  ondansetron (ZOFRAN) injection 4 mg (4 mg Intravenous Given 07/07/22 1835)     IMPRESSION / MDM / ASSESSMENT AND PLAN / ED COURSE  I reviewed the triage vital signs and the nursing notes.  Differential diagnosis includes, but is not limited to, COVID, flu, RSV, bronchitis, pneumonia, asthma exacerbation   Patient's presentation is most consistent with acute presentation with potential threat to life or bodily function.   Patient's diagnosis is consistent with COVID, asthma exacerbation.  Patient presents emergency department with increased wheezing.  She has a history of asthma, tested positive for COVID after a positive contact.  Patient's been able to maintain fever with antipyretics.  Patient was mostly concerned due to wheezing.  Did have some mild expiratory wheezing bilaterally on initial auscultation.  After DuoNeb treatment and Solu-Medrol patient feels much improved, breath sounds have improved.  At this time continue albuterol at home.  Will write  to the controlled medicine of antiemetic, cough medication as well as a course of prednisone for the patient.  Return precautions discussed with the patient.  Follow-up primary care as needed.  Patient is given ED precautions to return to the ED for any worsening or new symptoms.     FINAL CLINICAL IMPRESSION(S) / ED DIAGNOSES   Final diagnoses:  COVID-19  Mild intermittent asthma with exacerbation     Rx / DC Orders   ED Discharge Orders          Ordered    ondansetron (ZOFRAN-ODT) 4 MG disintegrating tablet  Every 8 hours PRN        07/07/22 2023    predniSONE (DELTASONE) 50 MG tablet  Daily with breakfast        07/07/22 2023    benzonatate (TESSALON PERLES) 100 MG capsule  3 times daily PRN        07/07/22 2023    brompheniramine-pseudoephedrine-DM 30-2-10 MG/5ML syrup  4 times daily PRN        07/07/22 2023             Note:  This document was prepared using Dragon voice recognition software and may include unintentional dictation errors.   Brynda Peon 07/07/22 2023    Rada Hay, MD 07/08/22 [phone]

## 2022-07-07 NOTE — ED Triage Notes (Signed)
Pt BIBA from home. Pt reports exposure to Covid on Tuesday. Tested positive today (Home test). Pt reports worsening SOB and fatigue. PMH Asthma and has been using her inhaler more.

## 2022-07-08 ENCOUNTER — Telehealth: Payer: Self-pay

## 2022-07-08 NOTE — Transitions of Care (Post Inpatient/ED Visit) (Signed)
   07/08/2022  [name] MRN: [id number] DOB: 10-21-1990  Today's TOC FU Call Status: Today's TOC FU Call Status:: Successful TOC FU Call Competed TOC FU Call Complete Date: 07/08/21  Transition Care Management Follow-up Telephone Call Date of Discharge: 07/07/22 Discharge Facility: [facility]) Type of Discharge: Emergency Department Reason for ED Visit:  (covid) How have you been since you were released from the hospital?: Better Any questions or concerns?: No  Items Reviewed: Did you receive and understand the discharge instructions provided?: No Medications obtained and verified?: Yes (Medications Reviewed) Any new allergies since your discharge?: No Dietary orders reviewed?: NA Do you have support at home?: Yes People in Home: spouse Name of Support/Comfort Primary Source: [facility] and Equipment/Supplies: [place] Ordered?: No Any new equipment or medical supplies ordered?: No  Functional Questionnaire: Do you need assistance with bathing/showering or dressing?: No Do you need assistance with meal preparation?: No Do you need assistance with eating?: No Do you have difficulty maintaining continence: No Do you need assistance with getting out of bed/getting out of a chair/moving?: No Do you have difficulty managing or taking your medications?: No  Folllow up appointments reviewed: PCP Follow-up appointment confirmed?: No MD Provider Line Number:[phone] Given: Yes XN:[id number]) Specialist Hospital Follow-up appointment confirmed?: No Do you need transportation to your follow-up appointment?: No Do you understand care options if your condition(s) worsen?: Yes-patient verbalized understanding  Pt was seen at ER 07/08/2022 did TOC call for covid.  [facility] Chariah Bailey, CMA

## 2022-07-08 NOTE — Progress Notes (Signed)
  I called the patient to get her scheduled with the [facility] Care Team but the patient was sick and was not up to talking. She agreed that I could call her back next week.   Melanie Bell

## 2022-07-14 ENCOUNTER — Ambulatory Visit
Admission: RE | Admit: 2022-07-14 | Discharge: 2022-07-14 | Disposition: A | Discharge status: Home / Self Care | Payer: Medicaid Other | Source: Ambulatory Visit | Attending: Student | Admitting: Student

## 2022-07-14 DIAGNOSIS — R55 Syncope and collapse: Secondary | ICD-10-CM

## 2022-07-14 MED ORDER — GADOPICLENOL 0.5 MMOL/ML IV SOLN
10.0000 mL | Freq: Once | INTRAVENOUS | Status: AC | PRN
  Administered 2022-07-14: 10 mL via INTRAVENOUS

## 2022-07-25 ENCOUNTER — Emergency Department
Admission: EM | Admit: 2022-07-25 | Discharge: 2022-07-25 | Disposition: A | Discharge status: Home / Self Care | Payer: Medicaid Other | Attending: Emergency Medicine | Admitting: Emergency Medicine

## 2022-07-25 ENCOUNTER — Other Ambulatory Visit: Payer: Self-pay

## 2022-07-25 DIAGNOSIS — R21 Rash and other nonspecific skin eruption: Secondary | ICD-10-CM | POA: Diagnosis present

## 2022-07-25 DIAGNOSIS — T7840XA Allergy, unspecified, initial encounter: Secondary | ICD-10-CM | POA: Insufficient documentation

## 2022-07-25 DIAGNOSIS — Z79899 Other long term (current) drug therapy: Secondary | ICD-10-CM | POA: Diagnosis not present

## 2022-07-25 MED ORDER — CETIRIZINE HCL 10 MG PO TABS: 40.0000 mg | ORAL_TABLET | Freq: Every day | ORAL | 0 refills | Status: DC

## 2022-07-25 MED ORDER — TRIAMCINOLONE ACETONIDE 0.1 % EX CREA: 1.0000 | TOPICAL_CREAM | Freq: Four times a day (QID) | CUTANEOUS | 0 refills | Status: DC

## 2022-07-25 MED ORDER — FAMOTIDINE 20 MG PO TABS: 20.0000 mg | ORAL_TABLET | Freq: Every day | ORAL | 0 refills | Status: DC

## 2022-07-25 MED ORDER — DEXAMETHASONE SODIUM PHOSPHATE 10 MG/ML IJ SOLN
10.0000 mg | Freq: Once | INTRAMUSCULAR | Status: AC
  Administered 2022-07-25: 10 mg via INTRAMUSCULAR
  Filled 2022-07-25: qty 1

## 2022-07-25 MED ORDER — FAMOTIDINE 20 MG PO TABS
20.0000 mg | ORAL_TABLET | Freq: Once | ORAL | Status: AC
  Administered 2022-07-25: 20 mg via ORAL
  Filled 2022-07-25: qty 1

## 2022-07-25 MED ORDER — DIPHENHYDRAMINE HCL 50 MG/ML IJ SOLN
50.0000 mg | Freq: Once | INTRAMUSCULAR | Status: AC
  Administered 2022-07-25: 50 mg via INTRAMUSCULAR
  Filled 2022-07-25: qty 1

## 2022-07-25 NOTE — ED Provider Notes (Signed)
[facility] Provider Note  Patient Contact: 7:27 PM (approximate)   History   Urticaria   HPI  Melanie Bell is a 32 y.o. female who presents emergency department for complaint of possible allergic reaction.  Patient has a rash and itching to her face.  She did have an EEG done few days ago which involved "putting something on my face."  Patient did not have immediate symptoms after that however.  No new foods, lotions, medications or other triggering factors.  Patient denies any edema of the lips or tongue.  No difficulty breathing or swallowing.  No cough, shortness of breath, emesis.  Arrives with reassuring vital signs with no evidence of hypotension.     Physical Exam   Triage Vital Signs: ED Triage Vitals  Enc Vitals Group     BP 07/25/22 1641 127/71     Pulse Rate 07/25/22 1641 95     Resp 07/25/22 1641 18     Temp 07/25/22 1641 99.2 F (37.3 C)     Temp Source 07/25/22 1641 Oral     SpO2 07/25/22 1641 99 %     Weight 07/25/22 1632 279 lb 15.8 oz (127 kg)     Height 07/25/22 1738 '5\' 7"'$  (1.702 m)     Head Circumference --      Peak Flow --      Pain Score 07/25/22 1632 0     Pain Loc --      Pain Edu? --      Excl. in [place]? --     Most recent vital signs: Vitals:   07/25/22 1641  BP: 127/71  Pulse: 95  Resp: 18  Temp: 99.2 F (37.3 C)  SpO2: 99%     General: Alert and in no acute distress. ENT:      Ears:       Nose: No congestion/rhinnorhea.      Mouth/Throat: Mucous membranes are moist.  No angioedema. Neck: No stridor. No cervical spine tenderness to palpation  Cardiovascular:  Good peripheral perfusion Respiratory: Normal respiratory effort without tachypnea or retractions. Lungs CTAB. Good air entry to the bases with no decreased or absent breath sounds Gastrointestinal: Bowel sounds 4 quadrants. Soft and nontender to palpation. No guarding or rigidity. No palpable masses. No distention. No CVA  tenderness. Musculoskeletal: Full range of motion to all extremities.  Neurologic:  No gross focal neurologic deficits are appreciated.  Skin:   Macular rash to the face.  No hives or wheals. Other:   ED Results / Procedures / Treatments   Labs (all labs ordered are listed, but only abnormal results are displayed) Labs Reviewed - No data to display   EKG     RADIOLOGY    No results found.  PROCEDURES:  Critical Care performed: No  Procedures   MEDICATIONS ORDERED IN ED: Medications  diphenhydrAMINE (BENADRYL) injection 50 mg (has no administration in time range)  dexamethasone (DECADRON) injection 10 mg (has no administration in time range)  famotidine (PEPCID) tablet 20 mg (has no administration in time range)     IMPRESSION / MDM / ASSESSMENT AND PLAN / ED COURSE  I reviewed the triage vital signs and the nursing notes.                                 Differential diagnosis includes, but is not limited to, allergic reaction, anaphylaxis, allergic dermatitis  Patient's presentation  is most consistent with acute presentation with potential threat to life or bodily function.   Patient's diagnosis is consistent with allergic reaction.  Patient broke out into a pruritic rash to the face.  No evidence of anaphylaxis with no angioedema, hypotension, 2 system involvement.  Patient took Benadryl which helped slightly but did not fully alleviate symptoms.  No known triggers at this time.  Patient is given diphenhydramine, famotidine and steroid here in the emergency department.  Patient will have prescription for second-generation antihistamine, topical steroid, famotidine at home.  Return precautions discussed with patient.. Patient is given ED precautions to return to the ED for any worsening or new symptoms.     FINAL CLINICAL IMPRESSION(S) / ED DIAGNOSES   Final diagnoses:  Allergic reaction, initial encounter     Rx / DC Orders   ED Discharge Orders           Ordered    cetirizine (ZYRTEC) 10 MG tablet  Daily        07/25/22 2003    triamcinolone cream (KENALOG) 0.1 %  4 times daily        07/25/22 2003    famotidine (PEPCID) 20 MG tablet  Daily        07/25/22 2003             Note:  This document was prepared using Dragon voice recognition software and may include unintentional dictation errors.   Brynda Peon 07/25/22 2003    Naaman Plummer, MD 07/26/22 0003

## 2022-07-25 NOTE — ED Triage Notes (Signed)
Arrives from home via [facility].  C/O hives since yesterday.  No new detergents or anything.  Has histoyr of asthma, inhaler used and 50 mg benadryl taken PTA.  Patient c/o itching all over.  No hives or itching observed by EMS.  VS wnl.

## 2022-07-27 ENCOUNTER — Ambulatory Visit (INDEPENDENT_AMBULATORY_CARE_PROVIDER_SITE_OTHER): Payer: Medicaid Other

## 2022-07-27 ENCOUNTER — Ambulatory Visit: Payer: Self-pay

## 2022-07-27 ENCOUNTER — Ambulatory Visit
Admission: EM | Admit: 2022-07-27 | Discharge: 2022-07-27 | Disposition: A | Discharge status: Home / Self Care | Payer: Medicaid Other | Attending: Family Medicine | Admitting: Family Medicine

## 2022-07-27 DIAGNOSIS — S63502A Unspecified sprain of left wrist, initial encounter: Secondary | ICD-10-CM | POA: Diagnosis not present

## 2022-07-27 DIAGNOSIS — W19XXXA Unspecified fall, initial encounter: Secondary | ICD-10-CM

## 2022-07-27 MED ORDER — KETOROLAC TROMETHAMINE 30 MG/ML IJ SOLN
30.0000 mg | Freq: Once | INTRAMUSCULAR | Status: AC
  Administered 2022-07-27: 30 mg via INTRAMUSCULAR

## 2022-07-27 NOTE — ED Provider Notes (Signed)
[facility]    CSN: [id number] Arrival date & time: 07/27/22  1119      History   Chief Complaint No chief complaint on file.   HPI  HPI Melanie Bell is a 32 y.o. female. *** bed frame folding, fell with wrist between with direct impact   Melanie Bell presents for left wrist pain after a fall yesterday. Has bruising at the wrist. She took some Tylenol without relief.  She is right handed.   ***Injury occurred, mechanism and which ***shoulder injured.  Reports *** immediate pain in his shoulder ***. Did ***not hear any pop or abnormal sounds with his injury.  Continues to have *** description pain when moving his arm forward. Denies ***redness, swelling. Does not feel like her ***arm is weak. Has tried *** with little relief.  ***No change in pain day vs night.  Has ***never injured this *** shoulder before.  ***she is ***right handed.    Fever : no  Sore throat: no   Cough: no Appetite: normal  Hydration: normal  Abdominal pain: no Nausea: no Vomiting: no Sleep disturbance: no *** Back Pain: no Headache: no     Past Medical History:  Diagnosis Date   Allergy    Anxiety    Arthritis    Asthma    Depression    Foreign body aspiration    GERD (gastroesophageal reflux disease)    Hallucinations 09/30/2014   Sizoaffective   Hyperlipidemia    Hypertension    Hypoglycemia    Intentional ingestion of batteries 04/21/2016    2 AAA batteries and 1 thumb tack; intent to hurt herself/notes [id number]   Self-inflicted injury [id number]   Suicidal ideation 01/02/2018   Tardive dyskinesia 10/2014   recent onset   Thyroid disease     Patient Active Problem List   Diagnosis Date Noted   Symptomatic cholelithiasis 02/16/2022   Peritoneal adhesions without obstruction 02/16/2022   Hematemesis 01/14/2022   Encounter for supervision of other normal pregnancy, first trimester 11/12/2021   Morbid obesity ([place]) 09/30/2021   Acquired hypothyroidism 06/25/2021    Migraine without aura and without status migrainosus, not intractable 03/05/2021   Mild intermittent asthma without complication [id number]   Gastroesophageal reflux disease 12/01/2020   Hypoglycemia 12/01/2020   Rheumatoid factor positive 12/09/2019   SS-A antibody positive 10/28/2019   Chronic pain of both knees 10/28/2019   Hyperprolactinemia ([place]) 08/02/2019   Schizoaffective disorder, depressive type ([place]) 02/02/2018   Suicide attempt ([place]) 01/16/2018   Dysfunctional uterine bleeding 01/06/2018   Borderline personality disorder ([place]) 01/04/2018   PTSD (post-traumatic stress disorder) 01/03/2018   ASCUS of cervix with negative high risk HPV 08/28/2017   Malingering 05/06/2016    Past Surgical History:  Procedure Laterality Date   ABDOMINAL SURGERY     "years ago" to remove foreign objects   APPENDECTOMY     BREAST EXCISIONAL BIOPSY Right 09/03/2007   surgical bx removed fibroadeoma   BREAST LUMPECTOMY Right    COLONOSCOPY WITH PROPOFOL N/A 09/10/2015   Procedure: COLONOSCOPY WITH PROPOFOL;  Surgeon: [name], MD;  Location: [facility] ENDOSCOPY;  Service: Endoscopy;  Laterality: N/A;   ESOPHAGOGASTRODUODENOSCOPY N/A 11/28/2014   Procedure: ESOPHAGOGASTRODUODENOSCOPY (EGD);  Surgeon: [name], MD;  Location: [facility] ENDOSCOPY;  Service: Endoscopy;  Laterality: N/A;   ESOPHAGOGASTRODUODENOSCOPY N/A 02/21/2016   Procedure: ESOPHAGOGASTRODUODENOSCOPY (EGD);  Surgeon: Mauri Pole, MD;  Location: [facility] ENDOSCOPY;  Service: Endoscopy;  Laterality: N/A;   ESOPHAGOGASTRODUODENOSCOPY N/A 04/21/2016   Procedure: ESOPHAGOGASTRODUODENOSCOPY (EGD);  Surgeon: Gatha Mayer, MD;  Location: [facility] ENDOSCOPY;  Service: Endoscopy;  Laterality: N/A;   ESOPHAGOGASTRODUODENOSCOPY N/A 05/06/2016   Procedure: ESOPHAGOGASTRODUODENOSCOPY (EGD);  Surgeon: Jonathon Bellows, MD;  Location: [facility] ENDOSCOPY;  Service: Gastroenterology;  Laterality: N/A;   ESOPHAGOGASTRODUODENOSCOPY N/A 06/13/2016    Procedure: ESOPHAGOGASTRODUODENOSCOPY (EGD);  Surgeon: [name], MD;  Location: [facility] ENDOSCOPY;  Service: Endoscopy;  Laterality: N/A;   ESOPHAGOGASTRODUODENOSCOPY N/A 01/14/2022   Procedure: ESOPHAGOGASTRODUODENOSCOPY (EGD);  Surgeon: Toledo, Benay Pike, MD;  Location: [facility] ENDOSCOPY;  Service: Gastroenterology;  Laterality: N/A;   ESOPHAGOGASTRODUODENOSCOPY (EGD) WITH PROPOFOL N/A 02/29/2016   Procedure: ESOPHAGOGASTRODUODENOSCOPY (EGD) WITH PROPOFOL;  Surgeon: [name], MD;  Location: [facility] ENDOSCOPY;  Service: Endoscopy;  Laterality: N/A;   ESOPHAGOGASTRODUODENOSCOPY (EGD) WITH PROPOFOL N/A 01/15/2018   Procedure: ESOPHAGOGASTRODUODENOSCOPY (EGD) WITH PROPOFOL;  Surgeon: Daneil Dolin, MD;  Location: [facility] ENDO SUITE;  Service: Endoscopy;  Laterality: N/A;  retrieval forein body   LAPAROSCOPIC LYSIS OF ADHESIONS  02/16/2022   Procedure: LAPAROSCOPIC LYSIS OF ADHESIONS;  Surgeon: Olean Ree, MD;  Location: [facility] ORS;  Service: General;;   LAPAROTOMY N/A 09/12/2015   Procedure: EXPLORATORY LAPAROTOMY;  Surgeon: Florene Glen, MD;  Location: [facility] ORS;  Service: General;  Laterality: N/A;   SIGMOIDOSCOPY N/A 09/12/2015   Procedure: Lonell Face;  Surgeon: Florene Glen, MD;  Location: [facility] ORS;  Service: General;  Laterality: N/A;   WISDOM TOOTH EXTRACTION      OB History     Gravida  5   Para  0   Term  0   Preterm  0   AB  3   Living  0      SAB  3   IAB  0   Ectopic  0   Multiple  0   Live Births               Home Medications    Prior to Admission medications   Medication Sig Start Date End Date Taking? Authorizing Provider  albuterol (VENTOLIN HFA) 108 (90 Base) MCG/ACT inhaler 2 puffs four times a day as needed 09/03/21   Glean Hess, MD  ARIPiprazole (ABILIFY) 5 MG tablet Take 5 mg by mouth daily. 12/03/21   [provider]  benzonatate (TESSALON PERLES) 100 MG capsule Take 1 capsule (100 mg total) by mouth 3 (three) times daily as  needed for cough. 07/07/22 07/07/23  Cuthriell, Charline Bills, PA-C  brompheniramine-pseudoephedrine-DM 30-2-10 MG/5ML syrup Take 10 mLs by mouth 4 (four) times daily as needed. 07/07/22   Cuthriell, Charline Bills, PA-C  cabergoline (DOSTINEX) 0.5 MG tablet Take 1 tablet (0.5 mg total) by mouth once a week. 05/04/22   Shamleffer, Melanie Crazier, MD  cetirizine (ZYRTEC) 10 MG tablet Take 4 tablets (40 mg total) by mouth daily for 5 days. 07/25/22 07/30/22  Cuthriell, Charline Bills, PA-C  escitalopram (LEXAPRO) 10 MG tablet Take 10 mg by mouth daily. 08/10/21   [provider]  famotidine (PEPCID) 20 MG tablet Take 1 tablet (20 mg total) by mouth daily for 5 days. 07/25/22 07/30/22  Cuthriell, Charline Bills, PA-C  fluticasone-salmeterol (ADVAIR HFA) [phone] MCG/ACT inhaler Inhale 2 puffs into the lungs 2 (two) times daily. 09/03/21   Glean Hess, MD  hydrOXYzine (VISTARIL) 50 MG capsule Take 50 mg by mouth 2 (two) times daily as needed. 08/20/19   [provider]  levothyroxine (SYNTHROID) 75 MCG tablet Take 1 tablet (75 mcg total) by mouth daily before breakfast. 05/02/22   Shamleffer, Melanie Crazier, MD  lithium carbonate 300 MG capsule Take 1 capsule (300 mg total) by mouth 2 (two) times daily with a meal. 12/11/17   [name], [name], MD  magnesium oxide (MAG-OX) 400 MG tablet Take 400 mg by mouth daily.    [provider]  meloxicam (MOBIC) 15 MG tablet Take 1 tablet (15 mg total) by mouth daily. 06/21/22 06/21/23  Teodoro Spray, PA  omeprazole (PRILOSEC) 20 MG capsule TAKE 1 CAPSULE (20 MG TOTAL) BY MOUTH DAILY. 06/07/22   Glean Hess, MD  ondansetron (ZOFRAN) 4 MG tablet Take 1 tablet (4 mg total) by mouth daily as needed for nausea or vomiting. 06/28/22 06/28/23  Lucillie Garfinkel, MD  ondansetron (ZOFRAN-ODT) 4 MG disintegrating tablet Take 1 tablet (4 mg total) by mouth every 8 (eight) hours as needed. 07/07/22   Cuthriell, Charline Bills, PA-C  prazosin (MINIPRESS) 2 MG capsule Take 1  capsule (2 mg total) by mouth at bedtime. 01/12/18   McNew, Tyson Babinski, MD  predniSONE (DELTASONE) 50 MG tablet Take 1 tablet (50 mg total) by mouth daily with breakfast. 07/07/22   Cuthriell, Charline Bills, PA-C  pregabalin (LYRICA) 50 MG capsule Take 50 mg by mouth 2 (two) times daily. 07/12/21   [provider]  sucralfate (CARAFATE) 1 g tablet Take 1 tablet (1 g total) by mouth 4 (four) times daily. 06/12/22   Nance Pear, MD  SUMAtriptan Dellis Filbert) 20 MG/ACT nasal spray Place 20 mg into the nose as needed. 12/06/21   [provider]  topiramate (TOPAMAX) 25 MG tablet Take 25 mg by mouth 2 (two) times daily. 12/06/21   [provider]  triamcinolone cream (KENALOG) 0.1 % Apply 1 Application topically 4 (four) times daily. 07/25/22   Cuthriell, Charline Bills, PA-C    Family History Family History  Problem Relation Age of Onset   Depression Mother    Hypertension Mother    Sleep apnea Mother    Asthma Mother    COPD Mother    Diabetes Mother    Anxiety disorder Mother    Arthritis Mother    Breast cancer Maternal Aunt        20's   Stroke Maternal Grandmother     Social History Social History   Tobacco Use   Smoking status: Former    Packs/day: 0.50    Years: 3.00    Total pack years: 1.50    Types: Cigarettes    Quit date: 08/14/2016    Years since quitting: 5.9    Passive exposure: Past   Smokeless tobacco: Never  Vaping Use   Vaping Use: Never used  Substance Use Topics   Alcohol use: No   Drug use: No     Allergies   Betadine [povidone iodine], Fish allergy, and Shellfish-derived products   Review of Systems Review of Systems: :negative unless otherwise stated in HPI.      Physical Exam Triage Vital Signs ED Triage Vitals  Enc Vitals Group     BP --      Pulse --      Resp --      Temp --      Temp src --      SpO2 --      Weight 07/27/22 1252 269 lb (122 kg)     Height 07/27/22 1252 '5\' 7"'$  (1.702 m)     Head Circumference --       Peak Flow --      Pain Score 07/27/22 1248 7  Pain Loc --      Pain Edu? --      Excl. in [place]? --    No data found.  Updated Vital Signs Ht '5\' 7"'$  (1.702 m)   Wt 122 kg   BMI 42.13 kg/m   Visual Acuity Right Eye Distance:   Left Eye Distance:   Bilateral Distance:    Right Eye Near:   Left Eye Near:    Bilateral Near:     Physical Exam GEN: well appearing female in no acute distress  CVS: well perfused  RESP: speaking in full sentences without pause, no respiratory distress  MSK:   *** shoulder:  No evidence of bony deformity, asymmetry, or muscle atrophy. No tenderness over long head of biceps (bicipital groove).  No TTP at [facility] joint.  Full active and passive (ABD, ADD, Flexion, extension, IR, ER). Limited *** 2/2 to pain.  Strength 5/5 grip, elbow and shoulder. No abnormal scapular function observed.  Special Tests: Luan Pulling: ***; Empty Can: ***, Neer's: Negative; Painful arc: Negative; Anterior Apprehension: Negative Sensation intact. Peripheral pulses intact.   UC Treatments / Results  Labs (all labs ordered are listed, but only abnormal results are displayed) Labs Reviewed - No data to display  EKG   Radiology No results found.  Procedures Procedures (including critical care time)  Medications Ordered in UC Medications - No data to display  Initial Impression / Assessment and Plan / UC Course  I have reviewed the triage vital signs and the nursing notes.  Pertinent labs & imaging results that were available during my care of the patient were reviewed by me and considered in my medical decision making (see chart for details).      Pt is a 32 y.o.  female with *** days of *** shoulder pain after ***.   Obtained *** shoulder plain films.  Personally reviewed by me were ***unremarkable for fracture or dislocation. Radiologist notes *** no soft tissue swelling. On exam, pt has tenderness at *** concerning for ***.  Given ***Toradol  IM/sling/brace/crutches  Patient to gradually return to normal activities, as tolerated and continue ordinary activities within the limits permitted by pain. Prescribed Naproxen sodium *** and muscle relaxer *** for pain relief.  Tylenol PRN. Advised patient to avoid OTC NSAIDs while taking prescription NSAID. Counseled patient on red flag symptoms and when to seek immediate care.  ***No red flags such as progressive major motor weakness.   Patient to follow up with orthopedic provider, if symptoms do not improve with conservative treatment.  Return and ED precautions given. Understanding voiced. Discussed MDM, treatment plan and plan for follow-up with patient/parent who agrees with plan.   Final Clinical Impressions(s) / UC Diagnoses   Final diagnoses:  None   Discharge Instructions   None    ED Prescriptions   None    PDMP not reviewed this encounter.

## 2022-07-27 NOTE — Telephone Encounter (Signed)
    Chief Complaint: Left wrist injury with swelling and bruising Symptoms: Above  Frequency: Yesterday Pertinent Negatives: Patient denies  Disposition: [] ED /[x] Urgent Care (no appt availability in office) / [] Appointment(In office/virtual)/ []  [facility]/ [] Home Care/ [] Refused Recommended Disposition /[] [place] [facility]/ []  Follow-up with PCP Additional Notes: Pt. Going UC per FC, Amanda.  Reason for Disposition  SEVERE swelling (e.g., can barely bend or move wrist joint)  Answer Assessment - Initial Assessment Questions 1. ONSET: "When did the swelling start?" (e.g., minutes, hours, days, weeks)     Yesterday 2. LOCATION: "What part of the wrist is swollen?"  "Are both wrists swollen or just one wrist?"     Wrist 3. SEVERITY: "How bad is the swelling?"    * NONE: No joint swelling.   * SKIN ONLY: Localized; small area of puffy or swollen skin.   * BALL OR LUMP: There is a small firm ball or lump; size of a pea, marble, or grape.   * MILD: Joint looks or feels mildly swollen or puffy.   * MODERATE: Swollen; interferes with normal activities (e.g., work or school); decreased range of movement.   * SEVERE: Very swollen; can't move swollen joint at all; unable to hold a cup of water.     Moderate 4. RECURRENT SYMPTOM: "Have you had wrist swelling before?" If Yes, ask: "When was the last time?" "What happened that time?"     No 5. CAUSE: "What do you think is causing the wrist swelling?" (e.g., arthritis, ganglion cyst, insect bite, recent injury)     Fall 6. OTHER SYMPTOMS: "Do you have any other symptoms?" (e.g., fever, hand pain)     Pain, swelling 7. PREGNANCY: "Is there any chance you are pregnant?" "When was your last menstrual period?"     No  Protocols used: Wrist Swelling-A-AH

## 2022-07-27 NOTE — ED Triage Notes (Signed)
Pt has left wrist wrapped in ace bandage and states she wrapped it at home  Pt c/o fall and injury to left wrist pm 07/26/22 at 1pm.   Pt was folding a bed frame and fell forward and landed on her wrist. Pt heard a crack and c/o swelling and bruising. '  Pt has bruising in a band along her wrist and states that she can not move her wrist. Pt has pain along the forearm and when touching the forearm the pain shoots to the wrist.

## 2022-07-27 NOTE — Discharge Instructions (Addendum)
Your xrays did not show a fracute or dislocation.  Hold your home Meloxicam while taking 400-800 mg of Ibuprofen 2-3 times a day for the next week.  Wear your wrist brace for the next 7 days.   Follow up with an orthopedic provider such as [facility] or Dr Rosette Reveal.

## 2022-08-05 ENCOUNTER — Other Ambulatory Visit: Payer: Medicaid Other | Admitting: Obstetrics and Gynecology

## 2022-08-05 NOTE — Patient Outreach (Signed)
Care Coordination  08/05/2022  Montez Koetje [facility] August 15, 1990 [id number]   Medicaid Managed Care   Unsuccessful Outreach Note  08/05/2022 [name] MRN: [id number] DOB: 1990-10-13  Referred by: Glean Hess, MD Reason for referral :  (Unsuccessful telephone outreach)  An unsuccessful telephone outreach was attempted today. The patient was referred to the case management team for assistance with care management and care coordination.   Follow Up Plan: The patient has been provided with contact information for the care management team and has been advised to call with any health related questions or concerns.  The care management team will reach out to the patient again over the next 30 business  days.   Aida Raider RN, BSN [place]  [facility] [employer] - Managed Medicaid High Risk [phone]

## 2022-08-05 NOTE — Patient Instructions (Signed)
Hi Ms. Westerlund, I am sorry I missed you today  - as a part of your Medicaid benefit, you are eligible for care management and care coordination services at no cost or copay. I was unable to reach you by phone today but would be happy to help you with your health related needs. Please feel free to call me at [phone].   A member of the Managed Medicaid care management team will reach out to you again over the next 30 business  days.   Aida Raider RN, BSN [place]  [facility] [employer] - Managed Medicaid High Risk [phone].

## 2022-08-06 ENCOUNTER — Emergency Department
Admission: EM | Admit: 2022-08-06 | Discharge: 2022-08-06 | Disposition: A | Discharge status: Home / Self Care | Payer: Medicaid Other | Attending: Emergency Medicine | Admitting: Emergency Medicine

## 2022-08-06 ENCOUNTER — Emergency Department
Admission: EM | Admit: 2022-08-06 | Discharge: 2022-08-06 | Disposition: A | Discharge status: Home / Self Care | Payer: Medicaid Other | Source: Home / Self Care

## 2022-08-06 ENCOUNTER — Encounter: Payer: Self-pay | Admitting: Emergency Medicine

## 2022-08-06 ENCOUNTER — Other Ambulatory Visit: Payer: Self-pay

## 2022-08-06 DIAGNOSIS — R002 Palpitations: Secondary | ICD-10-CM | POA: Insufficient documentation

## 2022-08-06 DIAGNOSIS — L089 Local infection of the skin and subcutaneous tissue, unspecified: Secondary | ICD-10-CM | POA: Diagnosis not present

## 2022-08-06 DIAGNOSIS — Z3202 Encounter for pregnancy test, result negative: Secondary | ICD-10-CM | POA: Diagnosis not present

## 2022-08-06 DIAGNOSIS — R21 Rash and other nonspecific skin eruption: Secondary | ICD-10-CM | POA: Diagnosis present

## 2022-08-06 LAB — POC URINE PREG, ED: Preg Test, Ur: NEGATIVE

## 2022-08-06 MED ORDER — DIPHENHYDRAMINE HCL 25 MG PO CAPS
50.0000 mg | ORAL_CAPSULE | Freq: Once | ORAL | Status: AC
  Administered 2022-08-06: 50 mg via ORAL
  Filled 2022-08-06: qty 2

## 2022-08-06 MED ORDER — ACETAMINOPHEN 500 MG PO TABS
1000.0000 mg | ORAL_TABLET | Freq: Once | ORAL | Status: AC
  Administered 2022-08-06: 1000 mg via ORAL
  Filled 2022-08-06: qty 2

## 2022-08-06 NOTE — Discharge Instructions (Addendum)
Consider either Cetaphil or CeraVe face wash  You are not pregnant  You can use Benadryl or famotidine/Pepcid to help with any remaining itchy sensation  Please take Tylenol and ibuprofen/Advil for your pain.  It is safe to take them together, or to alternate them every few hours.  Take up to 1000mg  of Tylenol at a time, up to 4 times per day.  Do not take more than 4000 mg of Tylenol in 24 hours.  For ibuprofen, take 400-600 mg, 3 - 4 times per day.

## 2022-08-06 NOTE — ED Provider Notes (Signed)
   [facility] Provider Note    Event Date/Time   First MD Initiated Contact with Patient 08/06/22 [phone]     (approximate)   History   Rash   HPI  Melanie Bell is a 32 y.o. female who presents to the ED for evaluation of Rash   Patient presents to the ED for evaluation of a rash on the left side of her face and concerned that she might be pregnant.  She reports a "faint positive" pregnancy test earlier this week and so she started taking prenatal vitamins and she is concerned that the rash is allergic reaction to these prenatal vitamins.   Physical Exam   Triage Vital Signs: ED Triage Vitals  Enc Vitals Group     BP 08/06/22 0448 115/63     Pulse Rate 08/06/22 0448 72     Resp 08/06/22 0448 18     Temp 08/06/22 0448 98.5 F (36.9 C)     Temp Source 08/06/22 0448 Oral     SpO2 08/06/22 0448 99 %     Weight 08/06/22 0438 267 lb 13.7 oz (121.5 kg)     Height 08/06/22 0438 5\' 7"  (1.702 m)     Head Circumference --      Peak Flow --      Pain Score --      Pain Loc --      Pain Edu? --      Excl. in [place]? --     Most recent vital signs: Vitals:   08/06/22 0448 08/06/22 0646  BP: 115/63 121/71  Pulse: 72 69  Resp: 18 18  Temp: 98.5 F (36.9 C) 98.3 F (36.8 C)  SpO2: 99% 100%    General: Awake, no distress.  CV:  Good peripheral perfusion.  Resp:  Normal effort.  No wheezing Abd:  No distention.  Soft MSK:  No deformity noted.  Neuro:  No focal deficits appreciated. Other:  Pustular rash on the face, as below without any surrounding or confluent erythema.  No hives.     ED Results / Procedures / Treatments   Labs (all labs ordered are listed, but only abnormal results are displayed) Labs Reviewed  POC URINE PREG, ED    EKG   RADIOLOGY   Official radiology report(s): No results found.  PROCEDURES and INTERVENTIONS:  Procedures  Medications  diphenhydrAMINE (BENADRYL) capsule 50 mg (50 mg Oral Given 08/06/22  [id number])  acetaminophen (TYLENOL) tablet 1,000 mg (1,000 mg Oral Given 08/06/22 [id number])     IMPRESSION / MDM / ASSESSMENT AND PLAN / ED COURSE  I reviewed the triage vital signs and the nursing notes.  Differential diagnosis includes, but is not limited to, anaphylaxis, hives, acne, atopic reaction  32 year old woman presents with pustules on her face more consistent with acne than any sort of allergic reaction.  She is not pregnant.  We discussed local skin care and return precautions.     FINAL CLINICAL IMPRESSION(S) / ED DIAGNOSES   Final diagnoses:  Pustule  Pregnancy test negative     Rx / DC Orders   ED Discharge Orders     None        Note:  This document was prepared using Dragon voice recognition software and may include unintentional dictation errors.   Vladimir Crofts, MD 08/06/22 [phone]

## 2022-08-06 NOTE — ED Provider Notes (Signed)
[facility] Provider Note    None    (approximate)   History   Chest Pain   HPI  Melanie Bell is a 32 y.o. female with a history of schizoaffective disorder, malingering, PTSD, borderline personality disorder who presents with complaints of palpitations.  Patient arrived via EMS.  She reports that she was watching TV, felt palpitations, lasted less than a few minutes.  She is feeling better now.  Denies chest pain.  No shortness of breath.  No nausea vomiting or diaphoresis.     Physical Exam   Triage Vital Signs: ED Triage Vitals  Enc Vitals Group     BP 08/06/22 1918 (!) 114/55     Pulse Rate 08/06/22 1918 70     Resp 08/06/22 1918 16     Temp 08/06/22 1918 98 F (36.7 C)     Temp Source 08/06/22 1918 Oral     SpO2 08/06/22 1918 100 %     Weight 08/06/22 1914 121.5 kg (267 lb 13.7 oz)     Height 08/06/22 1914 1.702 m (5\' 7" )     Head Circumference --      Peak Flow --      Pain Score 08/06/22 1914 2     Pain Loc --      Pain Edu? --      Excl. in [place]? --     Most recent vital signs: Vitals:   08/06/22 1918  BP: (!) 114/55  Pulse: 70  Resp: 16  Temp: 98 F (36.7 C)  SpO2: 100%     General: Awake, no distress.  CV:  Good peripheral perfusion.  Regular rate no murmur Resp:  Normal effort.  Clear to auscultation bilaterally Abd:  No distention.  Other:  No calf pain or swelling   ED Results / Procedures / Treatments   Labs (all labs ordered are listed, but only abnormal results are displayed) Labs Reviewed - No data to display   EKG  ED ECG REPORT I, Lavonia Drafts, the attending physician, personally viewed and interpreted this ECG.  Date: 08/06/2022  Rhythm: normal sinus rhythm QRS Axis: normal Intervals: normal ST/T Wave abnormalities: normal Narrative Interpretation: no evidence of acute ischemia    RADIOLOGY     PROCEDURES:  Critical Care performed:   Procedures   MEDICATIONS ORDERED IN  ED: Medications - No data to display   IMPRESSION / MDM / [place] / ED COURSE  I reviewed the triage vital signs and the nursing notes. Patient's presentation is most consistent with acute complicated illness / injury requiring diagnostic workup.  Patient presents with brief episode of palpitations, now resolved.  She is well-appearing and in no acute distress.  Differential includes arrhythmia, tachycardia, anxiety  EKG today is unremarkable.  Patient is well-known to our department, has been seen multiple times in the last several weeks for various complaints.  Do not feel added benefit of lab work or further workup at this time given that she is asymptomatic, will have her follow-up with cardiology as needed.  Return precautions discussed.        FINAL CLINICAL IMPRESSION(S) / ED DIAGNOSES   Final diagnoses:  Palpitations     Rx / DC Orders   ED Discharge Orders          Ordered    Ambulatory referral to Cardiology       Comments: If you have not heard from the Cardiology office within the next 72  hours please call [phone].   08/06/22 1922             Note:  This document was prepared using Dragon voice recognition software and may include unintentional dictation errors.   Lavonia Drafts, MD 08/06/22 1928

## 2022-08-06 NOTE — ED Triage Notes (Signed)
Pt to ED from home via EMS. Pt is here for "anxiety" and heart racing with CP. Pt states it started about 30 mins before she called EMS. Pt is CAOx4 and in no acute distress in triage.

## 2022-08-06 NOTE — ED Triage Notes (Addendum)
Pt presents via [facility] from home with complaints of rash on her neck and torso after taking prenatal vitamins, first time taking them. She notes taking benadryl around 2200 with no improvement. Pt has pin-point bumps - which she endorses pruritus - denies pain. Airway patent - respirations and unlabored. A&Ox4 at this time. Denies CP or SOB.    VSS with EMS.

## 2022-08-06 NOTE — ED Notes (Signed)
Per MD Kinner, only EKG.

## 2022-08-09 ENCOUNTER — Emergency Department: Payer: Medicaid Other

## 2022-08-09 ENCOUNTER — Emergency Department
Admission: EM | Admit: 2022-08-09 | Discharge: 2022-08-09 | Disposition: A | Discharge status: Home / Self Care | Payer: Medicaid Other | Attending: Emergency Medicine | Admitting: Emergency Medicine

## 2022-08-09 DIAGNOSIS — S8001XA Contusion of right knee, initial encounter: Secondary | ICD-10-CM | POA: Diagnosis not present

## 2022-08-09 DIAGNOSIS — Y92010 Kitchen of single-family (private) house as the place of occurrence of the external cause: Secondary | ICD-10-CM | POA: Insufficient documentation

## 2022-08-09 DIAGNOSIS — Y9301 Activity, walking, marching and hiking: Secondary | ICD-10-CM | POA: Diagnosis not present

## 2022-08-09 DIAGNOSIS — W010XXA Fall on same level from slipping, tripping and stumbling without subsequent striking against object, initial encounter: Secondary | ICD-10-CM | POA: Insufficient documentation

## 2022-08-09 NOTE — ED Provider Notes (Signed)
   [facility] Provider Note    Event Date/Time   First MD Initiated Contact with Patient 08/09/22 1500     (approximate)   History   Fall   HPI  Cilia Piedra is a 32 y.o. female with history of PTSD, borderline personality disorder, schizoaffective disorder, malingering who presents for fall.  Patient seen by me yesterday for separate complaints.  Patient reports she lost her balance, fell forward onto her right knee.  Complains of pain and swelling, no other injuries reported.  Not on blood thinners.     Physical Exam   Triage Vital Signs: ED Triage Vitals  Enc Vitals Group     BP 08/09/22 1505 119/60     Pulse Rate 08/09/22 1505 74     Resp 08/09/22 1505 18     Temp 08/09/22 1505 98.2 F (36.8 C)     Temp Source 08/09/22 1505 Oral     SpO2 08/09/22 1500 99 %     Weight 08/09/22 1503 121.5 kg (267 lb 13.7 oz)     Height 08/09/22 1503 1.702 m (5\' 7" )     Head Circumference --      Peak Flow --      Pain Score 08/09/22 1501 10     Pain Loc --      Pain Edu? --      Excl. in [place]? --     Most recent vital signs: Vitals:   08/09/22 1500 08/09/22 1505  BP:  119/60  Pulse:  74  Resp:  18  Temp:  98.2 F (36.8 C)  SpO2: 99% 99%     General: Awake, no distress.  CV:  Good peripheral perfusion.  Resp:  Normal effort.  Abd:  No distention.  Other:  Right knee: Bruising and mild swelling anterior to the patella, no pain with varus or valgus stress, no significant pain with axial load some pain with flexion   ED Results / Procedures / Treatments   Labs (all labs ordered are listed, but only abnormal results are displayed) Labs Reviewed - No data to display   EKG     RADIOLOGY Knee x-rays viewed interpreted by me, no fracture    PROCEDURES:  Critical Care performed:   Procedures   MEDICATIONS ORDERED IN ED: Medications - No data to display   IMPRESSION / MDM / [place] / ED COURSE  I reviewed the  triage vital signs and the nursing notes. Patient's presentation is most consistent with acute complicated illness / injury requiring diagnostic workup.   Patient presents after a fall as detailed above.  Isolated right knee injury, suspect contusion, possibility of patellar fracture less likely tibial plateau fracture  Pending x-rays, EMS gave IV fentanyl  X-rays negative for fracture, recommend ice, over-the-counter analgesics, outpatient follow-up as needed     FINAL CLINICAL IMPRESSION(S) / ED DIAGNOSES   Final diagnoses:  Contusion of right knee, initial encounter     Rx / DC Orders   ED Discharge Orders     None        Note:  This document was prepared using Dragon voice recognition software and may include unintentional dictation errors.   Lavonia Drafts, MD 08/09/22 [phone]

## 2022-08-09 NOTE — ED Triage Notes (Signed)
Pt presents to the ED via [facility]. States that she was walking in the kitchen with a walker when her left knee gave out and she fell straight on to her right knee. Pt reports hx of osteoarthritis.  41mcg of fentanyl given via 22g IV left hand.

## 2022-08-09 NOTE — ED Notes (Signed)
Pt verbalizes understanding of discharge instructions. Opportunity for questioning and answers were provided. Pt discharged from ED to home with friend.   ° °

## 2022-08-10 ENCOUNTER — Telehealth: Payer: Self-pay

## 2022-08-10 NOTE — Telephone Encounter (Signed)
..   Medicaid Managed Care   Unsuccessful Outreach Note  08/10/2022 [name] MRN: [name] DOB: Dec 11, 1990  Referred by: Glean Hess, MD Reason for referral : Appointment (I called the patient today to get her rescheduled for her missed phone appt with the MM RNCM. I left my name and number on her VM.)   A second unsuccessful telephone outreach was attempted today. The patient was referred to the case management team for assistance with care management and care coordination.   Follow Up Plan: A HIPAA compliant phone message was left for the patient providing contact information and requesting a return call.  The care management team will reach out to the patient again over the next 7 days.   [place]  [phone]

## 2022-08-11 ENCOUNTER — Telehealth: Payer: Self-pay

## 2022-08-11 NOTE — Telephone Encounter (Signed)
     Patient  visit on 3/26  at [place]    Have you been able to follow up with your primary care physician? Yes   The patient was or was not able to obtain any needed medicine or equipment. Yes    Are there diet recommendations that you are having difficulty following? Na   Patient expresses understanding of discharge instructions and education provided has no other needs at this time.  Yes      [place] [phone] [address], [place], [place] [address] Phone: [phone] Email: [email or web address]

## 2022-08-22 ENCOUNTER — Encounter: Payer: Self-pay | Admitting: Emergency Medicine

## 2022-08-22 ENCOUNTER — Encounter: Payer: Self-pay | Admitting: Obstetrics and Gynecology

## 2022-08-22 ENCOUNTER — Other Ambulatory Visit: Payer: Medicaid Other | Admitting: Obstetrics and Gynecology

## 2022-08-22 ENCOUNTER — Emergency Department
Admission: EM | Admit: 2022-08-22 | Discharge: 2022-08-22 | Disposition: A | Discharge status: Home / Self Care | Payer: Medicaid Other | Attending: Emergency Medicine | Admitting: Emergency Medicine

## 2022-08-22 ENCOUNTER — Other Ambulatory Visit: Payer: Self-pay

## 2022-08-22 DIAGNOSIS — M791 Myalgia, unspecified site: Secondary | ICD-10-CM | POA: Diagnosis not present

## 2022-08-22 DIAGNOSIS — K529 Noninfective gastroenteritis and colitis, unspecified: Secondary | ICD-10-CM | POA: Diagnosis not present

## 2022-08-22 DIAGNOSIS — R112 Nausea with vomiting, unspecified: Secondary | ICD-10-CM | POA: Diagnosis present

## 2022-08-22 LAB — COMPREHENSIVE METABOLIC PANEL
ALT: 10 U/L (ref 0–44)
AST: 17 U/L (ref 15–41)
Albumin: 4 g/dL (ref 3.5–5.0)
Alkaline Phosphatase: 57 U/L (ref 38–126)
Anion gap: 5 (ref 5–15)
BUN: 22 mg/dL — ABNORMAL HIGH (ref 6–20)
CO2: 25 mmol/L (ref 22–32)
Calcium: 9.3 mg/dL (ref 8.9–10.3)
Chloride: 110 mmol/L (ref 98–111)
Creatinine, Ser: 0.81 mg/dL (ref 0.44–1.00)
GFR, Estimated: >60 mL/min (ref >60)
Glucose, Bld: 98 mg/dL (ref 70–99)
Potassium: 4.1 mmol/L (ref 3.5–5.1)
Sodium: 140 mmol/L (ref 135–145)
Total Bilirubin: 0.6 mg/dL (ref 0.3–1.2)
Total Protein: 7.3 g/dL (ref 6.5–8.1)

## 2022-08-22 LAB — LIPASE, BLOOD: Lipase: 35 U/L (ref 11–51)

## 2022-08-22 LAB — CBC
HCT: 42 % (ref 36.0–46.0)
Hemoglobin: 13.4 g/dL (ref 12.0–15.0)
MCH: 30 pg (ref 26.0–34.0)
MCHC: 31.9 g/dL (ref 30.0–36.0)
MCV: 94 fL (ref 80.0–100.0)
Platelets: 202 10*3/uL (ref 150–400)
RBC: 4.47 MIL/uL (ref 3.87–5.11)
RDW: 13 % (ref 11.5–15.5)
WBC: 7.5 10*3/uL (ref 4.0–10.5)
nRBC: 0 % (ref 0.0–0.2)

## 2022-08-22 MED ORDER — ONDANSETRON 4 MG PO TBDP: 4.0000 mg | ORAL_TABLET | Freq: Three times a day (TID) | ORAL | 0 refills | Status: DC | PRN

## 2022-08-22 MED ORDER — ONDANSETRON 4 MG PO TBDP
4.0000 mg | ORAL_TABLET | Freq: Once | ORAL | Status: AC
  Administered 2022-08-22: 4 mg via ORAL
  Filled 2022-08-22: qty 1

## 2022-08-22 NOTE — ED Provider Notes (Signed)
[facility] Provider Note    Event Date/Time   First MD Initiated Contact with Patient 08/22/22 1422     (approximate)   History   Emesis   HPI  Melanie Bell is a 32 y.o. female who presents with complaints of nausea vomiting diarrhea which started last night.  She denies abdominal pain.  She thinks she has had fevers.  She is here primarily because she desires symptom relief.  Patient well-known to our emergency department.     Physical Exam   Triage Vital Signs: ED Triage Vitals [08/22/22 1357]  Enc Vitals Group     BP 122/79     Pulse Rate 70     Resp 18     Temp 98.2 F (36.8 C)     Temp Source Oral     SpO2 98 %     Weight      Height      Head Circumference      Peak Flow      Pain Score 6     Pain Loc      Pain Edu?      Excl. in [place]?     Most recent vital signs: Vitals:   08/22/22 1357  BP: 122/79  Pulse: 70  Resp: 18  Temp: 98.2 F (36.8 C)  SpO2: 98%     General: Awake, no distress.  CV:  Good peripheral perfusion.  Resp:  Normal effort.  Abd:  No distention.  Soft, nontender Other:     ED Results / Procedures / Treatments   Labs (all labs ordered are listed, but only abnormal results are displayed) Labs Reviewed  COMPREHENSIVE METABOLIC PANEL - Abnormal; Notable for the following components:      Result Value   BUN 22 (*)    All other components within normal limits  LIPASE, BLOOD  CBC  URINALYSIS, ROUTINE W REFLEX MICROSCOPIC  POC URINE PREG, ED     EKG     RADIOLOGY     PROCEDURES:  Critical Care performed:   Procedures   MEDICATIONS ORDERED IN ED: Medications  ondansetron (ZOFRAN-ODT) disintegrating tablet 4 mg (4 mg Oral Given 08/22/22 1441)     IMPRESSION / MDM / ASSESSMENT AND PLAN / ED COURSE  I reviewed the triage vital signs and the nursing notes. Patient's presentation is most consistent with acute illness / injury with system symptoms.   Patient presents with  nausea vomiting diarrhea, she does report some bodyaches, subjective fever suspicious for viral gastroenteritis, abdominal exam is quite reassuring, lab work is unremarkable, vital signs are normal.  For treatment with ODT Zofran, prescription provided, no indication for IV fluids or admission at this time, patient agrees to this plan.       FINAL CLINICAL IMPRESSION(S) / ED DIAGNOSES   Final diagnoses:  Gastroenteritis     Rx / DC Orders   ED Discharge Orders          Ordered    ondansetron (ZOFRAN-ODT) 4 MG disintegrating tablet  Every 8 hours PRN,   Status:  Discontinued        08/22/22 1433    ondansetron (ZOFRAN-ODT) 4 MG disintegrating tablet  Every 8 hours PRN        08/22/22 1446             Note:  This document was prepared using Dragon voice recognition software and may include unintentional dictation errors.   Jene Every, MD 08/22/22 [phone]

## 2022-08-22 NOTE — ED Notes (Signed)
Pt was up all night with V/D and sore throat. Pt reports having a fever that broke with tylenol. Pt is coughing up green mucous that started about 3pm yesterday and is SOB.

## 2022-08-22 NOTE — Patient Instructions (Signed)
Hi Melanie Bell, thanks for speaking with me today-have a beautiful day!  Melanie Bell was given information about Medicaid Managed Care team care coordination services and verbally consented to engagement with the [facility] Managed Care team.   Melanie Bell - following are the goals we discussed in your visit today:   Goals Addressed    Timeframe:  Long-Range Goal Priority:  High Start Date:  08/22/22                           Expected End Date: ongoing                      Follow Up Date 09/21/22    - practice safe sex - schedule appointment for vaccines needed due to my age or health - schedule recommended health tests (blood work, mammogram, colonoscopy, pap test) - schedule and keep appointment for annual check-up    Why is this important?   Screening tests can find diseases early when they are easier to treat.  Your doctor or nurse will talk with you about which tests are important for you.  Getting shots for common diseases like the flu and shingles will help prevent them.   Patient verbalizes understanding of instructions and care plan provided today and agrees to view in MyChart. Active MyChart status and patient understanding of how to access instructions and care plan via MyChart confirmed with patient.     The Managed Medicaid care management team will reach out to the patient again over the next 30 business  days.  The  Patient  has been provided with contact information for the Managed Medicaid care management team and has been advised to call with any health related questions or concerns.   Kathi Der RN, BSN [place]  [employer], production - Managed Medicaid High Risk [phone]

## 2022-08-22 NOTE — Patient Outreach (Signed)
Medicaid Managed Care   Nurse Care Manager Note  08/22/2022 Name:  Melanie Bell MRN:  [id number] DOB:  [birth year]  Melanie Bell is an 32 y.o. year old female who is a primary patient of Judithann GravesBerglund, Nyoka CowdenLaura H, MD.  [facility] team was consulted for assistance with:    Chronic healthcare management needs,   migraines, asthma, GERD, PTSD, borderline personality disorder, schizoaffective disorder, chronic pain, hypothyroid, osteoarthritis  Melanie Bell was given information about Medicaid Managed Care Coordination team services today. Melanie FarrierAshley Marie Bell Patient agreed to services and verbal consent obtained.  Engaged with patient by telephone for initial visit in response to provider referral for case management and/or care coordination services.   Assessments/Interventions:  Review of past medical history, allergies, medications, health status, including review of consultants reports, laboratory and other test data, was performed as part of comprehensive evaluation and provision of chronic care management services.  SDOH (Social Determinants of Health) assessments and interventions performed: SDOH Interventions    Flowsheet Row Patient Outreach Telephone from 08/22/2022 in [place] [facility]PART[facility] Office [facility] Support from [facility]  SDOH Interventions      Food Insecurity Interventions -- -- -- Intervention Not Indicated  Housing Interventions -- -- Intervention Not Indicated Intervention Not Indicated  Transportation Interventions -- -- Intervention Not Indicated Intervention Not Indicated  Alcohol Usage Interventions Intervention Not Indicated (Score <7) -- -- --  Depression  Interventions/Treatment  -- Counseling Counseling --  Financial Strain Interventions -- -- -- Intervention Not Indicated  Physical Activity Interventions -- -- Intervention Not Indicated Intervention Not Indicated  Stress Interventions Intervention Not Indicated -- Intervention Not Indicated Intervention Not Indicated  Social Connections Interventions -- -- Intervention Not Indicated Intervention Not Indicated     Care Plan  Allergies  Allergen Reactions   Betadine [Povidone Iodine] Other (See Comments)    Reaction:  Unknown    Fish Allergy Hives   Shellfish-Derived Products Other (See Comments)    Reaction:  Unknown    Medications Reviewed Today     Reviewed by Danie Chandlerraft, Gavynn Duvall G, RN (Registered Nurse) on 08/22/22 at [phone]  Med List Status: <None>   Medication Order Taking? Sig Documenting Provider Last Dose Status Informant  albuterol (VENTOLIN HFA) 108 (90 Base) MCG/ACT inhaler [id number] No 2 puffs four times a day as needed Reubin MilanBerglund, Laura H, MD Taking Active Self  ARIPiprazole (ABILIFY) 5 MG tablet [id number] No Take 5 mg by mouth daily. [provider] Taking Active Self  benzonatate (TESSALON PERLES) 100 MG capsule [id number] No Take 1 capsule (100 mg total) by mouth 3 (three) times daily as needed for cough. Racheal PatchesCuthriell, Jonathan D, PA-C Taking Active   brompheniramine-pseudoephedrine-DM 30-2-10 MG/5ML syrup [id number] No Take 10 mLs by mouth 4 (four) times daily as needed. Cuthriell, Delorise RoyalsJonathan D, PA-C Taking Active   cabergoline (DOSTINEX) 0.5 MG tablet [id number] No Take 1 tablet (0.5 mg total) by mouth once a week. Shamleffer, Konrad DoloresIbtehal Jaralla, MD Taking Active   cetirizine (ZYRTEC) 10 MG tablet [id number]  Take 4 tablets (40 mg total) by mouth daily for 5 days. Racheal PatchesCuthriell, Jonathan D, PA-C  Expired 07/30/22 2359   escitalopram (LEXAPRO) 10 MG tablet [id number] No Take 10 mg by mouth daily. [provider] Taking Active  Self  famotidine (PEPCID) 20 MG tablet [id number]  Take  1 tablet (20 mg total) by mouth daily for 5 days. Racheal Patches, PA-C  Expired 07/30/22 2359   fluticasone-salmeterol (ADVAIR HFA) 45-21 MCG/ACT inhaler [id number] No Inhale 2 puffs into the lungs 2 (two) times daily. [name], MD Taking Active Self  hydrOXYzine (VISTARIL) 50 MG capsule [id number] No Take 50 mg by mouth 2 (two) times daily as needed. [provider] Taking Active Self  levothyroxine (SYNTHROID) 75 MCG tablet [id number] No Take 1 tablet (75 mcg total) by mouth daily before breakfast. Shamleffer, Konrad Dolores, MD Taking Active   lithium carbonate 300 MG capsule [id number] No Take 1 capsule (300 mg total) by mouth 2 (two) times daily with a meal. [name], [name], MD Taking Active Self  magnesium oxide (MAG-OX) 400 MG tablet [id number] No Take 400 mg by mouth daily. [provider] Taking Active Self  meloxicam (MOBIC) 15 MG tablet [id number] No Take 1 tablet (15 mg total) by mouth daily. Varney Daily, PA Taking Active   omeprazole (PRILOSEC) 20 MG capsule [id number] No TAKE 1 CAPSULE (20 MG TOTAL) BY MOUTH DAILY. [name], MD Taking Active   ondansetron [facility]) 4 MG tablet [id number] No Take 1 tablet (4 mg total) by mouth daily as needed for nausea or vomiting. Pilar Jarvis, MD Taking Active   ondansetron (ZOFRAN-ODT) 4 MG disintegrating tablet [id number] No Take 1 tablet (4 mg total) by mouth every 8 (eight) hours as needed. Cuthriell, Delorise Royals, PA-C Taking Active   prazosin (MINIPRESS) 2 MG capsule [id number] No Take 1 capsule (2 mg total) by mouth at bedtime. McNew, Ileene Hutchinson, MD Taking Active Self  predniSONE (DELTASONE) 50 MG tablet [id number] No Take 1 tablet (50 mg total) by mouth daily with breakfast. Cuthriell, Delorise Royals, PA-C Taking Active   pregabalin (LYRICA) 50 MG capsule [id number] No Take 50 mg by mouth 2 (two) times daily. [provider] Taking Active Self  sucralfate (CARAFATE) 1 g tablet [id number] No Take 1  tablet (1 g total) by mouth 4 (four) times daily. Phineas Semen, MD Taking Active   SUMAtriptan [facility]) 20 MG/ACT nasal spray [id number] No Place 20 mg into the nose as needed. [provider] Taking Active Self  topiramate (TOPAMAX) 25 MG tablet [id number] No Take 25 mg by mouth 2 (two) times daily. [provider] Taking Active Self  triamcinolone cream (KENALOG) 0.1 % [id number]  Apply 1 Application topically 4 (four) times daily. Racheal Patches, [place]  Active            Patient Active Problem List   Diagnosis Date Noted   Symptomatic cholelithiasis 02/16/2022   Peritoneal adhesions without obstruction 02/16/2022   Hematemesis 01/14/2022   Encounter for supervision of other normal pregnancy, first trimester 11/12/2021   Morbid obesity 09/30/2021   Acquired hypothyroidism 06/25/2021   Migraine without aura and without status migrainosus, not intractable 03/05/2021   Mild intermittent asthma without complication 12/01/2020   Gastroesophageal reflux disease 12/01/2020   Hypoglycemia 12/01/2020   Rheumatoid factor positive 12/09/2019   SS-A antibody positive 10/28/2019   Chronic pain of both knees 10/28/2019   Hyperprolactinemia 08/02/2019   Schizoaffective disorder, depressive type 02/02/2018   Suicide attempt 01/16/2018   Dysfunctional uterine bleeding 01/06/2018   Borderline personality disorder 01/04/2018   PTSD (post-traumatic stress disorder) 01/03/2018   ASCUS of cervix with negative high risk HPV 08/28/2017   Malingering 05/06/2016   Conditions to be addressed/monitored per  PCP order:  chronic healthcare management needs, migraines, asthma, GERD, PTSD, borderline personality disorder, schizoaffective disorder, chronic pain, hypothyroid, osteoarthritis   Care Plan : RN Care Manager Plan of Care  Updates made by Danie Chandler, RN since 08/22/2022 12:00 AM     Problem: Health Promotion or Disease Self-Management (General Plan of Care)       Long-Range Goal: Chronic Disease Management   Start Date: 08/22/2022  Expected End Date: 11/21/2022  Priority: High  Note:   Current Barriers:  Knowledge Deficits related to plan of care for management of migraines, asthma, GERD, PTSD, borderline personality disorder, schizoaffective disorder, chronic pain, hypothyroid, osteoarthritis  Chronic Disease Management support and education needs related to  Knowledge Deficits related to plan of care for management of migraines, asthma, GERD, PTSD, borderline personality disorder, schizoaffective disorder, chronic pain, hypothyroid, osteoarthritis 08/22/22:  patient complaining of right knee pain today-rates as an 8 and decreases to 6 with pain medication.  Has ORTHO follow up appt 4/22.  Patient followed by ACT team,  Denies SOB, palpitations  RNCM Clinical Goal(s):  Patient will verbalize understanding of plan for management of migraines, asthma, GERD, PTSD, borderline personality disorder, schizoaffective disorder, chronic pain, hypothyroid, osteoarthritis  verbalize basic understanding of  migraines, asthma, GERD, PTSD, borderline personality disorder, schizoaffective disorder, chronic pain, hypothyroid, osteoarthritis  take all medications exactly as prescribed and will call provider for medication related questions as evidenced by patient report demonstrate understanding of rationale for each prescribed medication as evidenced by patient report attend all scheduled medical appointments as evidenced by patient report and EMR review demonstrate ongoing  adherence to prescribed treatment plan for migraines, asthma, GERD, PTSD, borderline personality disorder, schizoaffective disorder, chronic pain, hypothyroid, osteoarthritis  continue to work with RN Care Manager to address care management and care coordination needs related to migraines, asthma, GERD, PTSD, borderline personality disorder, schizoaffective disorder, chronic pain, hypothyroid,  osteoarthritis  experience decrease in ED visits as evidenced by EMR review.  ED visits in in last 6 months =17 through collaboration with RN Care manager, provider, and care team.   Interventions: Inter-disciplinary care team collaboration (see longitudinal plan of care) Evaluation of current treatment plan related to  self management and patient's adherence to plan as established by provider  Asthma: (Status:New goal.) Long Term Goal Advised patient to track and manage Asthma triggers Advised patient to self assesses Asthma action plan zone and make appointment with provider if in the yellow zone for 48 hours without improvement Provided education about and advised patient to utilize infection prevention strategies to reduce risk of respiratory infection Discussed the importance of adequate rest and management of fatigue with Asthma Assessed social determinant of health barriers   Pain Interventions:  (Status:  New goal.) Long Term Goal Pain assessment performed Medications reviewed Reviewed provider established plan for pain management Discussed importance of adherence to all scheduled medical appointments Counseled on the importance of reporting any/all new or changed pain symptoms or management strategies to pain management provider Advised patient to report to care team affect of pain on daily activities Discussed use of relaxation techniques and/or diversional activities to assist with pain reduction (distraction, imagery, relaxation, massage, acupressure, TENS, heat, and cold application Reviewed with patient prescribed pharmacological and nonpharmacological pain relief strategies Assessed social determinant of health barriers  Patient Goals/Self-Care Activities: Take all medications as prescribed Attend all scheduled provider appointments Call pharmacy for medication refills 3-7 days in advance of running out of medications Perform all self care activities independently  Perform IADL's (shopping, preparing meals, housekeeping, managing finances) independently Call provider office for new concerns or questions   Follow Up Plan:  The patient has been provided with contact information for the care management team and has been advised to call with any health related questions or concerns.  The care management team will reach out to the patient again over the next 30 business  days.    Long-Range Goal: Establish Plan of Care for Chronic Disease Management Needs   Priority: High  Note:   Timeframe:  Long-Range Goal Priority:  High Start Date:  08/22/22                           Expected End Date: ongoing                      Follow Up Date 09/21/22    - practice safe sex - schedule appointment for vaccines needed due to my age or health - schedule recommended health tests (blood work, mammogram, colonoscopy, pap test) - schedule and keep appointment for annual check-up    Why is this important?   Screening tests can find diseases early when they are easier to treat.  Your doctor or nurse will talk with you about which tests are important for you.  Getting shots for common diseases like the flu and shingles will help prevent them.     Follow Up:  Patient agrees to Care Plan and Follow-up.  Plan: The Managed Medicaid care management team will reach out to the patient again over the next 30 business  days. and The  Patient has been provided with contact information for the Managed Medicaid care management team and has been advised to call with any health related questions or concerns.  Date/time of next scheduled RN care management/care coordination outreach: 09/21/22 at 0900

## 2022-08-22 NOTE — ED Triage Notes (Signed)
Patient to ED via [facility] for N/V/D since yesterday. States seh is only able to keep liquids down. States she had a 105.2 fever at home- took tylenol PTA.

## 2022-08-25 ENCOUNTER — Telehealth: Payer: Self-pay

## 2022-08-25 ENCOUNTER — Emergency Department: Payer: Medicaid Other

## 2022-08-25 ENCOUNTER — Other Ambulatory Visit: Payer: Self-pay

## 2022-08-25 ENCOUNTER — Emergency Department
Admission: EM | Admit: 2022-08-25 | Discharge: 2022-08-25 | Disposition: A | Discharge status: Home / Self Care | Payer: Medicaid Other | Attending: Emergency Medicine | Admitting: Emergency Medicine

## 2022-08-25 DIAGNOSIS — I8001 Phlebitis and thrombophlebitis of superficial vessels of right lower extremity: Secondary | ICD-10-CM | POA: Insufficient documentation

## 2022-08-25 DIAGNOSIS — M79661 Pain in right lower leg: Secondary | ICD-10-CM | POA: Diagnosis present

## 2022-08-25 NOTE — Discharge Instructions (Signed)
Rest, ice, elevate your leg.  Your ultrasound does not reveal a blood clot.  Please return for any new, worsening, or change in symptoms or other concerns.  It was a pleasure caring for you today.

## 2022-08-25 NOTE — Telephone Encounter (Signed)
     Patient  visit on 4/8  at [place]    Have you been able to follow up with your primary care physician? No     The patient was or was not able to obtain any needed medicine or equipment. Yes   Are there diet recommendations that you are having difficulty following? Na   Patient expresses understanding of discharge instructions and education provided has no other needs at this time.  Yes      Melanie Bell Pop Health Care Guide, [place] [phone] [address], [place], [place] [address] Phone: [phone] Email: [email or web address]    

## 2022-08-25 NOTE — ED Triage Notes (Signed)
Pt to ED [facility] from home. Pt has area to medial R upper R calf that is painful since 2 days ago, worse today. States "it feels like it's pulling from hte inside" and that pain radiates to back of knee. Pr has small area of swelling where it appears she may have a varicose vein. Respirations are unlabored.  Has not gotten expected period 4/7 (trying to get pregnant). Last period 07/15/22.

## 2022-08-25 NOTE — ED Triage Notes (Signed)
First Nurse Note: Arrives from home via [facility]  C/O Lower right leg pain. VS wnl.  Onset of pain today.

## 2022-08-25 NOTE — ED Provider Notes (Signed)
[facility] Provider Note    Event Date/Time   First MD Initiated Contact with Patient 08/25/22 1803     (approximate)   History   Leg Pain   HPI  Melanie Bell is a 32 y.o. female with a past medical history of schizoaffective disorder, borderline personality disorder, morbid obesity who presents today for evaluation of right calf pain.  Patient reports that she has an area to her right upper calf that has been painful for the past 2 days.  She reports that it feels like a pulled muscle.  She reports that it radiates to the back of her knee.  She denies personal or family history of PE or DVT.  She reports that her period is 3 days late and she would like a pregnancy test as well.  She has not had any abdominal pain, nausea, vomiting, chest pain, shortness of breath, or any other concerns today.  She denies any known injury to her calf.     Physical Exam   Triage Vital Signs: ED Triage Vitals  Enc Vitals Group     BP 08/25/22 1757 123/71     Pulse Rate 08/25/22 1757 77     Resp 08/25/22 1757 15     Temp 08/25/22 1757 98.9 F (37.2 C)     Temp Source 08/25/22 1757 Oral     SpO2 08/25/22 1757 99 %     Weight 08/25/22 1758 269 lb (122 kg)     Height 08/25/22 1758 5\' 7"  (1.702 m)     Head Circumference --      Peak Flow --      Pain Score 08/25/22 1758 7     Pain Loc --      Pain Edu? --      Excl. in GC? --     Most recent vital signs: Vitals:   08/25/22 1757 08/25/22 2001  BP: 123/71 136/74  Pulse: 77 75  Resp: 15 18  Temp: 98.9 F (37.2 C)   SpO2: 99% 98%    Physical Exam Vitals and nursing note reviewed.  Constitutional:      General: Awake and alert. No acute distress.    Appearance: Normal appearance. The patient is obese  HENT:     Head: Normocephalic and atraumatic.     Mouth: Mucous membranes are moist.  Eyes:     General: PERRL. Normal EOMs        Right eye: No discharge.        Left eye: No discharge.      Conjunctiva/sclera: Conjunctivae normal.  Cardiovascular:     Rate and Rhythm: Normal rate and regular rhythm.     Pulses: Normal pulses.     Heart sounds: Normal heart sounds Pulmonary:     Effort: Pulmonary effort is normal. No respiratory distress.     Breath sounds: Normal breath sounds.  Abdominal:     Abdomen is soft. There is no abdominal tenderness. No rebound or guarding. No distention. Musculoskeletal:        General: No swelling. Normal range of motion.     Cervical back: Normal range of motion and neck supple.  Right calf: Superficial varicosity noted to her right medial proximal calf.  No fluctuance or induration.  Normal plantarflexion and dorsiflexion at her ankle.  Normal range of motion of the knee and hip.  No pitting edema throughout the leg.  Normal distal pulses.  Legs equal in circumference bilaterally. Skin:  General: Skin is warm and dry.     Capillary Refill: Capillary refill takes less than 2 seconds.     Findings: No rash.  Neurological:     Mental Status: The patient is awake and alert.      ED Results / Procedures / Treatments   Labs (all labs ordered are listed, but only abnormal results are displayed) Labs Reviewed  POC URINE PREG, ED     EKG     RADIOLOGY I independently reviewed and interpreted imaging and agree with radiologists findings.     PROCEDURES:  Critical Care performed:   Procedures   MEDICATIONS ORDERED IN ED: Medications - No data to display   IMPRESSION / MDM / ASSESSMENT AND PLAN / ED COURSE  I reviewed the triage vital signs and the nursing notes.   Differential diagnosis includes, but is not limited to, superficial thrombophlebitis, varicosity, DVT, strain, spasm.  Patient is awake and alert, hemodynamically stable and afebrile.  She is a superficial varicosity noted to her area of tenderness.  Ultrasound obtained demonstrates no DVT.  She is neurovascularly intact.  Compartments are soft and compressible  throughout, not consistent with compartment syndrome.  She requested a pregnancy test given that her period was obtained and this is negative.  She was reminded that NSAIDs are harmful in pregnancy.  We discussed return precautions and the importance of close outpatient follow-up.  Patient understands and agrees with plan.  She was discharged in stable condition.   Patient's presentation is most consistent with acute complicated illness / injury requiring diagnostic workup.     FINAL CLINICAL IMPRESSION(S) / ED DIAGNOSES   Final diagnoses:  Thrombophlebitis of superficial veins of right lower extremity     Rx / DC Orders   ED Discharge Orders     None        Note:  This document was prepared using Dragon voice recognition software and may include unintentional dictation errors.   Keturah Shavers 08/25/22 2030    Phineas Semen, MD 08/25/22 2126

## 2022-08-25 NOTE — ED Notes (Signed)
Placed ace bangdage on site

## 2022-08-29 LAB — POC URINE PREG, ED: Preg Test, Ur: NEGATIVE

## 2022-08-30 ENCOUNTER — Emergency Department: Payer: Medicaid Other

## 2022-08-30 ENCOUNTER — Other Ambulatory Visit: Payer: Self-pay

## 2022-08-30 ENCOUNTER — Emergency Department
Admission: EM | Admit: 2022-08-30 | Discharge: 2022-08-30 | Disposition: A | Discharge status: Home / Self Care | Payer: Medicaid Other | Attending: Emergency Medicine | Admitting: Emergency Medicine

## 2022-08-30 DIAGNOSIS — J45909 Unspecified asthma, uncomplicated: Secondary | ICD-10-CM | POA: Insufficient documentation

## 2022-08-30 DIAGNOSIS — J069 Acute upper respiratory infection, unspecified: Secondary | ICD-10-CM

## 2022-08-30 DIAGNOSIS — E039 Hypothyroidism, unspecified: Secondary | ICD-10-CM | POA: Insufficient documentation

## 2022-08-30 DIAGNOSIS — Z1152 Encounter for screening for COVID-19: Secondary | ICD-10-CM | POA: Diagnosis not present

## 2022-08-30 DIAGNOSIS — R0602 Shortness of breath: Secondary | ICD-10-CM | POA: Diagnosis present

## 2022-08-30 LAB — CBC
HCT: 43.2 % (ref 36.0–46.0)
Hemoglobin: 14 g/dL (ref 12.0–15.0)
MCH: 30.1 pg (ref 26.0–34.0)
MCHC: 32.4 g/dL (ref 30.0–36.0)
MCV: 92.9 fL (ref 80.0–100.0)
Platelets: 180 10*3/uL (ref 150–400)
RBC: 4.65 MIL/uL (ref 3.87–5.11)
RDW: 13.2 % (ref 11.5–15.5)
WBC: 6.1 10*3/uL (ref 4.0–10.5)
nRBC: 0 % (ref 0.0–0.2)

## 2022-08-30 LAB — RESP PANEL BY RT-PCR (RSV, FLU A&B, COVID)  RVPGX2
Influenza A by PCR: NEGATIVE
Influenza B by PCR: NEGATIVE
Resp Syncytial Virus by PCR: NEGATIVE
SARS Coronavirus 2 by RT PCR: NEGATIVE

## 2022-08-30 LAB — BASIC METABOLIC PANEL
Anion gap: 8 (ref 5–15)
BUN: 16 mg/dL (ref 6–20)
CO2: 24 mmol/L (ref 22–32)
Calcium: 9.3 mg/dL (ref 8.9–10.3)
Chloride: 108 mmol/L (ref 98–111)
Creatinine, Ser: 0.7 mg/dL (ref 0.44–1.00)
GFR, Estimated: >60 mL/min (ref >60)
Glucose, Bld: 103 mg/dL — ABNORMAL HIGH (ref 70–99)
Potassium: 3.7 mmol/L (ref 3.5–5.1)
Sodium: 140 mmol/L (ref 135–145)

## 2022-08-30 LAB — GROUP A STREP BY PCR: Group A Strep by PCR: NOT DETECTED

## 2022-08-30 MED ORDER — ACETAMINOPHEN 325 MG PO TABS
650.0000 mg | ORAL_TABLET | Freq: Once | ORAL | Status: AC
  Administered 2022-08-30: 650 mg via ORAL
  Filled 2022-08-30: qty 2

## 2022-08-30 NOTE — ED Triage Notes (Signed)
Pt to ED [facility] from home for "pain to ribs when breathing", not feeling well and shob. Also reports congestion. RR even and unlabored. Speaking in complete sentences. NAD noted.  States husband is sick as well.

## 2022-08-30 NOTE — Discharge Instructions (Signed)
Your x-ray is negative for pneumonia.  Your labs are normal.  Your COVID, flu, RSV, and strep swabs are negative.  Please return for any new, worsening, or change in symptoms or other concerns.

## 2022-08-30 NOTE — ED Provider Notes (Signed)
[facility] Provider Note    Event Date/Time   First MD Initiated Contact with Patient 08/30/22 1401     (approximate)   History   Shortness of Breath   HPI  Melanie Bell is a 32 y.o. female who presents today for evaluation of cough, runny nose, and bodyaches for the past few days.  She reports that her husband is sick with similar symptoms.  She reports that she has pain in her ribs when she coughs.  No pain with deep inspiration.  She has not had any lower extremity swelling.  She denies feeling short of breath.  She has not had any fevers or chills.  She denies abdominal pain or dysuria.  She reports that she recently took a home positive pregnancy test.  Patient Active Problem List   Diagnosis Date Noted   Symptomatic cholelithiasis 02/16/2022   Peritoneal adhesions without obstruction 02/16/2022   Hematemesis 01/14/2022   Encounter for supervision of other normal pregnancy, first trimester 11/12/2021   Morbid obesity 09/30/2021   Acquired hypothyroidism 06/25/2021   Migraine without aura and without status migrainosus, not intractable 03/05/2021   Mild intermittent asthma without complication 12/01/2020   Gastroesophageal reflux disease 12/01/2020   Hypoglycemia 12/01/2020   Rheumatoid factor positive 12/09/2019   SS-A antibody positive 10/28/2019   Chronic pain of both knees 10/28/2019   Hyperprolactinemia 08/02/2019   Schizoaffective disorder, depressive type 02/02/2018   Suicide attempt 01/16/2018   Dysfunctional uterine bleeding 01/06/2018   Borderline personality disorder 01/04/2018   PTSD (post-traumatic stress disorder) 01/03/2018   ASCUS of cervix with negative high risk HPV 08/28/2017   Malingering 05/06/2016          Physical Exam   Triage Vital Signs: ED Triage Vitals  Enc Vitals Group     BP 08/30/22 1158 116/89     Pulse Rate 08/30/22 1158 88     Resp 08/30/22 1158 18     Temp 08/30/22 1158 99 F (37.2 C)      Temp src --      SpO2 08/30/22 1158 99 %     Weight 08/30/22 1156 263 lb (119.3 kg)     Height 08/30/22 1156  (1.702 m)     Head Circumference --      Peak Flow --      Pain Score 08/30/22 1159 6     Pain Loc --      Pain Edu? --      Excl. in GC? --     Most recent vital signs: Vitals:   08/30/22 1158 08/30/22 1628  BP: 116/89 120/82  Pulse: 88 80  Resp: 18 18  Temp: 99 F (37.2 C) 99.2 F (37.3 C)  SpO2: 99% 99%    Physical Exam Vitals and nursing note reviewed.  Constitutional:      General: Awake and alert. No acute distress.    Appearance: Normal appearance. The patient is obese.  HENT:     Head: Normocephalic and atraumatic.     Mouth: Mucous membranes are moist.  Eyes:     General: PERRL. Normal EOMs        Right eye: No discharge.        Left eye: No discharge.     Conjunctiva/sclera: Conjunctivae normal.  Cardiovascular:     Rate and Rhythm: Normal rate and regular rhythm.     Pulses: Normal pulses.     Heart sounds: Normal heart sounds Pulmonary:  Effort: Pulmonary effort is normal. No respiratory distress.     Breath sounds: Normal breath sounds.  Able to speak easily in complete sentences Abdominal:     Abdomen is soft. There is no abdominal tenderness. No rebound or guarding. No distention. Musculoskeletal:        General: No swelling. Normal range of motion.     Cervical back: Normal range of motion and neck supple.  No pitting edema, no LE edema Skin:    General: Skin is warm and dry.     Capillary Refill: Capillary refill takes less than 2 seconds.     Findings: No rash.  Neurological:     Mental Status: The patient is awake and alert.      ED Results / Procedures / Treatments   Labs (all labs ordered are listed, but only abnormal results are displayed) Labs Reviewed  BASIC METABOLIC PANEL - Abnormal; Notable for the following components:      Result Value   Glucose, Bld 103 (*)    All other components within normal limits   RESP PANEL BY RT-PCR (RSV, FLU A&B, COVID)  [id number]  GROUP A STREP BY PCR  CBC     EKG     RADIOLOGY I independently reviewed and interpreted imaging and agree with radiologists findings.     PROCEDURES:  Critical Care performed:   Procedures   MEDICATIONS ORDERED IN ED: Medications  acetaminophen (TYLENOL) tablet 650 mg (650 mg Oral Given 08/30/22 1425)     IMPRESSION / MDM / ASSESSMENT AND PLAN / ED COURSE  I reviewed the triage vital signs and the nursing notes.   Differential diagnosis includes, but is not limited to, bronchitis, viral URI, pneumonia pharyngitis.  I reviewed the patient's chart.  Patient had an emergency department visit on 08/25/2022 at which time she was diagnosed with thrombophlebitis.  She had a negative pregnancy test at that time.  Patient is awake and alert, hemodynamically stable and afebrile.  She has normal oxygen saturation of 98% on room air and she is able to speak easily in complete sentences without accessory muscle use or tachypnea.  Her lungs are clear to auscultation bilaterally.  Labs and x-ray obtained in triage are overall reassuring.  No pneumonia noted.  COVID, flu, RSV and strep swabs are also negative.  I offered to repeat her pregnancy test given that she reported a home pregnancy test that was positive and she had a neg preg test in the ER 5 days ago, she declined.  No pleurisy, tachycardia, hypoxia, lower extremity pitting edema or calf tenderness or history of PE/DVT to suggest PE of the etiology of her symptoms today.  Most likely etiology is viral URI with her nasal congestion, cough, and aches, and her known sick contact with similar symptoms.  We discussed return precautions and the importance of close outpatient follow-up.  Also discussed symptomatic management.  Patient understands and agrees with plan.  She was discharged in stable condition.   Patient's presentation is most consistent with acute complicated illness /  injury requiring diagnostic workup.     FINAL CLINICAL IMPRESSION(S) / ED DIAGNOSES   Final diagnoses:  Viral URI with cough     Rx / DC Orders   ED Discharge Orders     None        Note:  This document was prepared using Dragon voice recognition software and may include unintentional dictation errors.   Keturah Shavers 08/30/22 1643    McHugh,  Teddy Spike, MD 08/31/22 2029

## 2022-08-30 NOTE — ED Notes (Signed)
Pt discharge to home. Pt VSS, GCS 15, NAD. Pt verbalized understanding of discharge instructions with no additional questions at this time.  

## 2022-08-30 NOTE — ED Triage Notes (Signed)
First nurse note: Pt to ED via [facility] from home. Pt reports feeling sick. Pt reports also feeling SOB with no relief from at home inhaler. Pt also reports + pregnancy test.   97% RA 98.5 oral

## 2022-08-31 ENCOUNTER — Telehealth: Payer: Self-pay

## 2022-08-31 ENCOUNTER — Other Ambulatory Visit: Payer: Self-pay

## 2022-08-31 DIAGNOSIS — Z3201 Encounter for pregnancy test, result positive: Secondary | ICD-10-CM

## 2022-08-31 NOTE — Transitions of Care (Post Inpatient/ED Visit) (Signed)
   08/31/2022  [name] MRN: [id number] DOB: 02-23-1991  Today's TOC FU Call Status: TOC FU Call Complete Date: 08/31/22  Transition Care Management Follow-up Telephone Call Date of Discharge: 08/30/22 Discharge Facility: [facility]) Type of Discharge: Emergency Department Reason for ED Visit: Respiratory How have you been since you were released from the hospital?: Better Any questions or concerns?: No  Items Reviewed: Did you receive and understand the discharge instructions provided?: No Medications obtained and verified?: Yes (Medications Reviewed) Any new allergies since your discharge?: No Dietary orders reviewed?: No Do you have support at home?: Yes People in Home: spouse Name of Support/Comfort Primary Source: [facility] and Equipment/Supplies: Were Home Health Services Ordered?: No Any new equipment or medical supplies ordered?: No  Functional Questionnaire: Do you need assistance with bathing/showering or dressing?: No Do you need assistance with meal preparation?: No Do you need assistance with eating?: No Do you have difficulty maintaining continence: No Do you need assistance with getting out of bed/getting out of a chair/moving?: No Do you have difficulty managing or taking your medications?: No  Follow up appointments reviewed: PCP Follow-up appointment confirmed?: No Do you need transportation to your follow-up appointment?: No Do you understand care options if your condition(s) worsen?: Yes-patient verbalized understanding    [facility]  Primary Care & Sports Medicine at [facility] CMA, AAMA [address]  [place] [address] Office [phone]  Fax: [phone]

## 2022-09-05 DIAGNOSIS — N912 Amenorrhea, unspecified: Secondary | ICD-10-CM | POA: Insufficient documentation

## 2022-09-05 DIAGNOSIS — Z3200 Encounter for pregnancy test, result unknown: Secondary | ICD-10-CM | POA: Insufficient documentation

## 2022-09-05 NOTE — Progress Notes (Unsigned)
    NURSE VISIT NOTE  Subjective:    Patient ID: Melanie Bell, female    DOB: [birth year], 31 y.o.   MRN: [id number]  HPI  Patient is a 32 y.o. [id number] female who presents for evaluation of amenorrhea. She believes she could be pregnant. {pregnancy desire:14500::"Pregnancy is desired."} Sexual Activity: {sexual partners:705}. Current symptoms also include: {preg sx:18128}. Last period was {norm/abn:16337}.    Objective:    LMP 07/15/2022 Comment: pt stated she got a +at home preg test that hasnt been confirmed yet 08/30/22  Lab Review  No results found for any visits on 09/06/22.  Assessment:   1. Amenorrhea   2. Possible pregnancy     Plan:   {AOB + PREGNANCY PLAN:28596:p}  {AOB - PREGNANCY PLAN:28597:p}   Rocco Serene, LPN

## 2022-09-06 ENCOUNTER — Ambulatory Visit (INDEPENDENT_AMBULATORY_CARE_PROVIDER_SITE_OTHER): Payer: Medicaid Other

## 2022-09-06 VITALS — BP 119/73 | Ht 67.0 in | Wt 274.0 lb

## 2022-09-06 DIAGNOSIS — N912 Amenorrhea, unspecified: Secondary | ICD-10-CM

## 2022-09-06 DIAGNOSIS — Z3200 Encounter for pregnancy test, result unknown: Secondary | ICD-10-CM

## 2022-09-06 DIAGNOSIS — Z3202 Encounter for pregnancy test, result negative: Secondary | ICD-10-CM | POA: Diagnosis not present

## 2022-09-06 LAB — POCT URINE PREGNANCY: Preg Test, Ur: NEGATIVE

## 2022-09-07 LAB — BETA HCG QUANT (REF LAB): hCG Quant: <1 m[IU]/mL

## 2022-09-13 ENCOUNTER — Ambulatory Visit
Admission: EM | Admit: 2022-09-13 | Discharge: 2022-09-13 | Disposition: A | Discharge status: Home / Self Care | Payer: Medicaid Other | Attending: Nurse Practitioner | Admitting: Nurse Practitioner

## 2022-09-13 DIAGNOSIS — T2121XA Burn of second degree of chest wall, initial encounter: Secondary | ICD-10-CM

## 2022-09-13 MED ORDER — SILVER SULFADIAZINE 1 % EX CREA
1.0000 | TOPICAL_CREAM | Freq: Every day | CUTANEOUS | 0 refills | Status: DC
Start: 2022-09-13 — End: 2022-11-14

## 2022-09-13 MED ORDER — SILVER SULFADIAZINE 1 % EX CREA: TOPICAL_CREAM | Freq: Once | CUTANEOUS | Status: AC
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
## 2022-09-13 NOTE — Discharge Instructions (Signed)
Keep burn area clean and dry.  Apply Silvadene topical antibiotic ointment to the area once daily Please follow-up with your PCP in 2 to 3 days for recheck Please go to the ER for any worsening symptoms

## 2022-09-21 ENCOUNTER — Other Ambulatory Visit: Payer: Medicaid Other | Admitting: Obstetrics and Gynecology

## 2022-09-21 ENCOUNTER — Encounter: Payer: Self-pay | Admitting: Obstetrics and Gynecology

## 2022-09-21 NOTE — Patient Instructions (Signed)
Hi Melanie Bell, thank you for speaking with me today-have a great day!  Melanie Bell was given information about Medicaid Managed Care team care coordination services and verbally consented to engagement with the [facility] Managed Care team.   Melanie Bell - following are the goals we discussed in your visit today:   Goals Addressed    Timeframe:  Long-Range Goal Priority:  High Start Date:  08/22/22                           Expected End Date: ongoing                      Follow Up Date 10/27/22   - practice safe sex - schedule appointment for vaccines needed due to my age or health - schedule recommended health tests (blood work, mammogram, colonoscopy, pap test) - schedule and keep appointment for annual check-up    Why is this important?   Screening tests can find diseases early when they are easier to treat.  Your doctor or nurse will talk with you about which tests are important for you.  Getting shots for common diseases like the flu and shingles will help prevent them.  09/21/22:  Patient attending PT, upcoming appt with CARDS and ENDO   Patient verbalizes understanding of instructions and care plan provided today and agrees to view in MyChart. Active MyChart status and patient understanding of how to access instructions and care plan via MyChart confirmed with patient.     The Managed Medicaid care management team will reach out to the patient again over the next 30 business  days.  The  Patient  has been provided with contact information for the Managed Medicaid care management team and has been advised to call with any health related questions or concerns.   Following is a copy of your plan of care:  Care Plan : RN Care Manager Plan of Care  Updates made by Danie Chandler, RN since 09/21/2022 12:00 AM     Problem: Health Promotion or Disease Self-Management (General Plan of Care)      Long-Range Goal: Chronic Disease Management   Start Date: 08/22/2022  Expected End Date: 11/21/2022   Priority: High  Note:   Current Barriers:  Knowledge Deficits related to plan of care for management of migraines, asthma, GERD, PTSD, borderline personality disorder, schizoaffective disorder, chronic pain, hypothyroid, osteoarthritis  Chronic Disease Management support and education needs related to  Knowledge Deficits related to plan of care for management of migraines, asthma, GERD, PTSD, borderline personality disorder, schizoaffective disorder, chronic pain, hypothyroid, osteoarthritis 09/21/22:  Patient with no complaints today, has activity with ACT today.  Patient states she started PT this week for right knee pain.  States headaches and breathing controlled and WNL.  Upcoming appts with CARDS and ENDO.  3 ED and 1 UC visit since last appt  RNCM Clinical Goal(s):  Patient will verbalize understanding of plan for management of migraines, asthma, GERD, PTSD, borderline personality disorder, schizoaffective disorder, chronic pain, hypothyroid, osteoarthritis  verbalize basic understanding of  migraines, asthma, GERD, PTSD, borderline personality disorder, schizoaffective disorder, chronic pain, hypothyroid, osteoarthritis  take all medications exactly as prescribed and will call provider for medication related questions as evidenced by patient report demonstrate understanding of rationale for each prescribed medication as evidenced by patient report attend all scheduled medical appointments as evidenced by patient report and EMR review demonstrate ongoing  adherence to  prescribed treatment plan for migraines, asthma, GERD, PTSD, borderline personality disorder, schizoaffective disorder, chronic pain, hypothyroid, osteoarthritis  continue to work with RN Care Manager to address care management and care coordination needs related to migraines, asthma, GERD, PTSD, borderline personality disorder, schizoaffective disorder, chronic pain, hypothyroid, osteoarthritis  experience decrease in ED visits  as evidenced by EMR review.  ED visits in in last 6 months =17 through collaboration with RN Care manager, provider, and care team.   Interventions: Inter-disciplinary care team collaboration (see longitudinal plan of care) Evaluation of current treatment plan related to  self management and patient's adherence to plan as established by provider  Asthma: (Status:New goal.) Long Term Goal Advised patient to track and manage Asthma triggers Advised patient to self assesses Asthma action plan zone and make appointment with provider if in the yellow zone for 48 hours without improvement Provided education about and advised patient to utilize infection prevention strategies to reduce risk of respiratory infection Discussed the importance of adequate rest and management of fatigue with Asthma Assessed social determinant of health barriers   Pain Interventions:  (Status:  New goal.) Long Term Goal Pain assessment performed Medications reviewed Reviewed provider established plan for pain management Discussed importance of adherence to all scheduled medical appointments Counseled on the importance of reporting any/all new or changed pain symptoms or management strategies to pain management provider Advised patient to report to care team affect of pain on daily activities Discussed use of relaxation techniques and/or diversional activities to assist with pain reduction (distraction, imagery, relaxation, massage, acupressure, TENS, heat, and cold application Reviewed with patient prescribed pharmacological and nonpharmacological pain relief strategies Assessed social determinant of health barriers  Patient Goals/Self-Care Activities: Take all medications as prescribed Attend all scheduled provider appointments Call pharmacy for medication refills 3-7 days in advance of running out of medications Perform all self care activities independently  Perform IADL's (shopping, preparing meals, housekeeping,  managing finances) independently Call provider office for new concerns or questions   Follow Up Plan:  The patient has been provided with contact information for the care management team and has been advised to call with any health related questions or concerns.  The care management team will reach out to the patient again over the next 30 business  days.    Kathi Der RN, BSN [place]  [employer], [employer] - Managed Medicaid High Risk [phone]

## 2022-09-21 NOTE — Patient Outreach (Signed)
Medicaid Managed Care   Nurse Care Manager Note  09/21/2022 Name:  Melanie Bell MRN:  [id number] DOB:  [birth year]  Melanie Bell is an 32 y.o. year old female who is a primary patient of Melanie Bell, Melanie Cowden, MD.  The [facility] Managed Care Coordination team was consulted for assistance with:    Chronic healthcare management needs, asthma, osteoarthritis, migraines, GERD, PTSD, borderline personality disorder, schizoaffective disorder, chronic pain, hypothyroid  Melanie Bell was given information about Medicaid Managed Care Coordination team services today. Melanie Bell Patient agreed to services and verbal consent obtained.  Engaged with patient by telephone for follow up visit in response to provider referral for case management and/or care coordination services.   Assessments/Interventions:  Review of past medical history, allergies, medications, health status, including review of consultants reports, laboratory and other test data, was performed as part of comprehensive evaluation and provision of chronic care management services.  SDOH (Social Determinants of Health) assessments and interventions performed: SDOH Interventions    Flowsheet Row Patient Outreach Telephone from 09/21/2022 in [place] [facility] Patient Outreach Telephone from 08/22/2022 in [place] [facility] Office Visit from 06/20/2022 in [facility] at [facility] Clinical Support from 01/05/2022 in [facility] Primary Care & Sports Medicine at [facility] Clinical Support from 01/04/2021 in [facility] Primary Care & [facility] Medicine at [facility]  SDOH Interventions       Food Insecurity Interventions -- -- -- -- Intervention Not Indicated  Housing Interventions -- -- -- Intervention Not Indicated Intervention Not Indicated  Transportation Interventions Intervention Not Indicated  [[name]] -- -- Intervention Not Indicated  Intervention Not Indicated  Alcohol Usage Interventions -- Intervention Not Indicated (Score <7) -- -- --  Depression Interventions/Treatment  -- -- Counseling Counseling --  Financial Strain Interventions -- -- -- -- Intervention Not Indicated  Physical Activity Interventions -- -- -- Intervention Not Indicated Intervention Not Indicated  Stress Interventions -- Intervention Not Indicated -- Intervention Not Indicated Intervention Not Indicated  Social Connections Interventions -- -- -- Intervention Not Indicated Intervention Not Indicated     Care Plan  Allergies  Allergen Reactions   Betadine [Povidone Iodine] Other (See Comments)    Reaction:  Unknown    Fish Allergy Hives   Shellfish-Derived Products Other (See Comments)    Reaction:  Unknown    Medications Reviewed Today     Reviewed by Danie Chandler, RN (Registered Nurse) on 09/21/22 at 1011  Med List Status: <None>   Medication Order Taking? Sig Documenting Provider Last Dose Status Informant  albuterol (VENTOLIN HFA) 108 (90 Base) MCG/ACT inhaler [id number] No 2 puffs four times a day as needed [name], MD 09/13/2022 Active Self  ARIPiprazole (ABILIFY) 5 MG tablet [id number] No Take 5 mg by mouth daily. [provider] 09/13/2022 Active Self  benzonatate (TESSALON PERLES) 100 MG capsule [id number] No Take 1 capsule (100 mg total) by mouth 3 (three) times daily as needed for cough. Lanette Hampshire 09/13/2022 Active   brompheniramine-pseudoephedrine-DM 30-2-10 MG/5ML syrup [id number] No Take 10 mLs by mouth 4 (four) times daily as needed. Racheal Patches, PA-C 09/13/2022 Active   cabergoline (DOSTINEX) 0.5 MG tablet [id number] No Take 1 tablet (0.5 mg total) by mouth once a week. Shamleffer, Konrad Dolores, MD 09/13/2022 Active   cetirizine (ZYRTEC) 10 MG tablet [id number]  Take 4 tablets (40 mg total) by mouth daily for 5 days. Cuthriell, Delorise Royals,  PA-C  Expired 07/30/22 2359   escitalopram  (LEXAPRO) 10 MG tablet [id number] No Take 10 mg by mouth daily. [provider] 09/13/2022 Active Self  famotidine (PEPCID) 20 MG tablet [id number]  Take 1 tablet (20 mg total) by mouth daily for 5 days. Racheal Patches, PA-C  Expired 09/13/22 2359   fluticasone-salmeterol (ADVAIR HFA) 45-21 MCG/ACT inhaler [id number] No Inhale 2 puffs into the lungs 2 (two) times daily. [name], MD 09/13/2022 Active Self  hydrOXYzine (VISTARIL) 50 MG capsule [id number] No Take 50 mg by mouth 2 (two) times daily as needed. [provider] 09/13/2022 Active Self  levothyroxine (SYNTHROID) 75 MCG tablet [id number] No Take 1 tablet (75 mcg total) by mouth daily before breakfast. Shamleffer, Konrad Dolores, MD 09/13/2022 Active   lithium carbonate 300 MG capsule [id number] No Take 1 capsule (300 mg total) by mouth 2 (two) times daily with a meal. [name], [name], MD 09/13/2022 Active Self  magnesium oxide (MAG-OX) 400 MG tablet [id number] No Take 400 mg by mouth daily. [provider] 09/13/2022 Active Self  meloxicam (MOBIC) 15 MG tablet [id number] No Take 1 tablet (15 mg total) by mouth daily. Varney Daily, [place] 09/13/2022 Active   omeprazole (PRILOSEC) 20 MG capsule [id number] No TAKE 1 CAPSULE (20 MG TOTAL) BY MOUTH DAILY. [name], MD 09/13/2022 Active   ondansetron (ZOFRAN) 4 MG tablet [id number] No Take 1 tablet (4 mg total) by mouth daily as needed for nausea or vomiting. Pilar Jarvis, MD 09/13/2022 Active   ondansetron (ZOFRAN-ODT) 4 MG disintegrating tablet [id number] No Take 1 tablet (4 mg total) by mouth every 8 (eight) hours as needed for nausea or vomiting. Faythe Ghee, PA-C 09/13/2022 Active   prazosin (MINIPRESS) 2 MG capsule [id number] No Take 1 capsule (2 mg total) by mouth at bedtime. Haskell Riling, MD 09/13/2022 Active Self  predniSONE (DELTASONE) 50 MG tablet [id number] No Take 1 tablet (50 mg total) by mouth daily with breakfast. Racheal Patches, PA-C  09/13/2022 Active   pregabalin (LYRICA) 50 MG capsule [id number] No Take 50 mg by mouth 2 (two) times daily. [provider] 09/13/2022 Active Self  silver sulfADIAZINE (SILVADENE) 1 % cream [id number]  Apply 1 Application topically daily. Radford Pax, NP  Active   sucralfate (CARAFATE) 1 g tablet [id number] No Take 1 tablet (1 g total) by mouth 4 (four) times daily. Phineas Semen, MD 09/13/2022 Active   SUMAtriptan (IMITREX) 20 MG/ACT nasal spray [id number] No Place 20 mg into the nose as needed. [provider] 09/13/2022 Active Self  topiramate (TOPAMAX) 25 MG tablet [id number] No Take 25 mg by mouth 2 (two) times daily. [provider] 09/13/2022 Active Self  triamcinolone cream (KENALOG) 0.1 % [id number] No Apply 1 Application topically 4 (four) times daily. Lanette Hampshire 09/13/2022 Active            Patient Active Problem List   Diagnosis Date Noted   Amenorrhea 09/05/2022   Possible pregnancy 09/05/2022   Symptomatic cholelithiasis 02/16/2022   Peritoneal adhesions without obstruction 02/16/2022   Hematemesis 01/14/2022   Encounter for supervision of other normal pregnancy, first trimester 11/12/2021   Morbid obesity (HCC) 09/30/2021   Acquired hypothyroidism 06/25/2021   Migraine without aura and without status migrainosus, not intractable 03/05/2021   Mild intermittent asthma without complication 12/01/2020   Gastroesophageal reflux disease 12/01/2020   Hypoglycemia 12/01/2020   Rheumatoid factor positive 12/09/2019   SS-A antibody positive 10/28/2019   Chronic pain of  both knees 10/28/2019   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   Schizoaffective disorder, depressive type (HCC) 02/02/2018   Suicide attempt (HCC) 01/16/2018   Dysfunctional uterine bleeding 01/06/2018   Borderline personality disorder (HCC) 01/04/2018   PTSD (post-traumatic stress disorder) 01/03/2018   ASCUS of cervix with negative high risk HPV 08/28/2017   Malingering  05/06/2016   Conditions to be addressed/monitored per PCP order:  Chronic healthcare management needs, asthma, osteoarthritis, migraines, GERD, PTSD, borderline personality disorder, schizoaffective disorder, chronic pain, hypothyroid  Care Plan : RN Care Manager Plan of Care  Updates made by Danie Chandler, RN since 09/21/2022 12:00 AM     Problem: Health Promotion or Disease Self-Management (General Plan of Care)      Long-Range Goal: Chronic Disease Management   Start Date: 08/22/2022  Expected End Date: 11/21/2022  Priority: High  Note:   Current Barriers:  Knowledge Deficits related to plan of care for management of migraines, asthma, GERD, PTSD, borderline personality disorder, schizoaffective disorder, chronic pain, hypothyroid, osteoarthritis  Chronic Disease Management support and education needs related to  Knowledge Deficits related to plan of care for management of migraines, asthma, GERD, PTSD, borderline personality disorder, schizoaffective disorder, chronic pain, hypothyroid, osteoarthritis 09/21/22:  Patient with no complaints today, has activity with ACT today.  Patient states she started PT this week for right knee pain.  States headaches and breathing controlled and WNL.  Upcoming appts with CARDS and ENDO.  3 ED and 1 UC visit since last appt.  RNCM Clinical Goal(s):  Patient will verbalize understanding of plan for management of migraines, asthma, GERD, PTSD, borderline personality disorder, schizoaffective disorder, chronic pain, hypothyroid, osteoarthritis  verbalize basic understanding of  migraines, asthma, GERD, PTSD, borderline personality disorder, schizoaffective disorder, chronic pain, hypothyroid, osteoarthritis  take all medications exactly as prescribed and will call provider for medication related questions as evidenced by patient report demonstrate understanding of rationale for each prescribed medication as evidenced by patient report attend all scheduled  medical appointments as evidenced by patient report and EMR review demonstrate ongoing  adherence to prescribed treatment plan for migraines, asthma, GERD, PTSD, borderline personality disorder, schizoaffective disorder, chronic pain, hypothyroid, osteoarthritis  continue to work with RN Care Manager to address care management and care coordination needs related to migraines, asthma, GERD, PTSD, borderline personality disorder, schizoaffective disorder, chronic pain, hypothyroid, osteoarthritis  experience decrease in ED visits as evidenced by EMR review.  ED visits in in last 6 months =17 through collaboration with RN Care manager, provider, and care team.   Interventions: Inter-disciplinary care team collaboration (see longitudinal plan of care) Evaluation of current treatment plan related to  self management and patient's adherence to plan as established by provider  Asthma: (Status:New goal.) Long Term Goal Advised patient to track and manage Asthma triggers Advised patient to self assesses Asthma action plan zone and make appointment with provider if in the yellow zone for 48 hours without improvement Provided education about and advised patient to utilize infection prevention strategies to reduce risk of respiratory infection Discussed the importance of adequate rest and management of fatigue with Asthma Assessed social determinant of health barriers   Pain Interventions:  (Status:  New goal.) Long Term Goal Pain assessment performed Medications reviewed Reviewed provider established plan for pain management Discussed importance of adherence to all scheduled medical appointments Counseled on the importance of reporting any/all new or changed pain symptoms or management strategies to pain management provider Advised patient to report to care team affect of pain  on daily activities Discussed use of relaxation techniques and/or diversional activities to assist with pain reduction  (distraction, imagery, relaxation, massage, acupressure, TENS, heat, and cold application Reviewed with patient prescribed pharmacological and nonpharmacological pain relief strategies Assessed social determinant of health barriers  Patient Goals/Self-Care Activities: Take all medications as prescribed Attend all scheduled provider appointments Call pharmacy for medication refills 3-7 days in advance of running out of medications Perform all self care activities independently  Perform IADL's (shopping, preparing meals, housekeeping, managing finances) independently Call provider office for new concerns or questions   Follow Up Plan:  The patient has been provided with contact information for the care management team and has been advised to call with any health related questions or concerns.  The care management team will reach out to the patient again over the next 30 business  days.    Long-Range Goal: Establish Plan of Care for Chronic Disease Management Needs   Priority: High  Note:   Timeframe:  Long-Range Goal Priority:  High Start Date:  08/22/22                           Expected End Date: ongoing                      Follow Up Date 10/27/22   - practice safe sex - schedule appointment for vaccines needed due to my age or health - schedule recommended health tests (blood work, mammogram, colonoscopy, pap test) - schedule and keep appointment for annual check-up    Why is this important?   Screening tests can find diseases early when they are easier to treat.  Your doctor or nurse will talk with you about which tests are important for you.  Getting shots for common diseases like the flu and shingles will help prevent them.  09/21/22:  Patient attending PT, upcoming appt with CARDS and ENDO    Follow Up:  Patient agrees to Care Plan and Follow-up.  Plan: The Managed Medicaid care management team will reach out to the patient again over the next 30 business  days. and The   Patient has been provided with contact information for the Managed Medicaid care management team and has been advised to call with any health related questions or concerns.  Date/time of next scheduled RN care management/care coordination outreach: 10/27/22 at 0900.

## 2022-09-28 ENCOUNTER — Telehealth: Payer: Self-pay

## 2022-09-28 ENCOUNTER — Encounter: Payer: Self-pay | Admitting: Cardiology

## 2022-09-28 ENCOUNTER — Ambulatory Visit (INDEPENDENT_AMBULATORY_CARE_PROVIDER_SITE_OTHER): Payer: Medicaid Other

## 2022-09-28 ENCOUNTER — Ambulatory Visit: Payer: Medicaid Other

## 2022-09-28 ENCOUNTER — Ambulatory Visit: Payer: Medicaid Other | Attending: Cardiology | Admitting: Cardiology

## 2022-09-28 VITALS — BP 124/80 | HR 73 | Ht 67.0 in | Wt 281.4 lb

## 2022-09-28 DIAGNOSIS — R002 Palpitations: Secondary | ICD-10-CM | POA: Insufficient documentation

## 2022-09-28 NOTE — Progress Notes (Signed)
Cardiology Office Note:    Date:  09/28/2022   ID:  Melanie Bell, DOB 12/10/1990, MRN [id number]  PCP:  [name], MD   [facility] Providers Cardiologist:  Debbe Odea, MD     Referring MD: [name], MD   Chief Complaint  Patient presents with   New Patient (Initial Visit)    Cardiac evaluation after ED visit at [facility] for palpitation episodes.  During the these episodes it feels like her chest is "jumping".  Presented to [facility] ED last night for seizure but left without being seen due to long waitn.    History of Present Illness:    Melanie Bell is a 32 y.o. female with a hx of hypothyroidism, obesity, asthma who presents due to palpitations.  Current symptoms of palpitations lasting 10 minutes about a month ago.  Patient was sitting at home about to go to sleep when symptoms occurred.  She told followed by who called EMS and patient brought to the ED.  Workup in the ED was unrevealing, advised to follow-up with cardiology upon discharge.  She denies history of heart disease, had another episode of palpitation about 1 to 2 weeks ago lasting a few minutes.  Denies dizziness, syncope or presyncope.  Past Medical History:  Diagnosis Date   Allergy    Anxiety    Arthritis    Asthma    Depression    Foreign body aspiration    GERD (gastroesophageal reflux disease)    Hallucinations 09/30/2014   Sizoaffective   Hyperlipidemia    Hypertension    Hypoglycemia    Intentional ingestion of batteries 04/21/2016    2 AAA batteries and 1 thumb tack; intent to hurt herself/notes 04/21/2016   Self-inflicted injury 11/23/2017   Suicidal ideation 01/02/2018   Tardive dyskinesia 10/2014   recent onset   Thyroid disease     Past Surgical History:  Procedure Laterality Date   ABDOMINAL SURGERY     "years ago" to remove foreign objects   APPENDECTOMY     BREAST EXCISIONAL BIOPSY Right 09/03/2007   surgical bx removed fibroadeoma   BREAST  LUMPECTOMY Right    CHOLECYSTECTOMY  02/2022   COLONOSCOPY WITH PROPOFOL N/A 09/10/2015   Procedure: COLONOSCOPY WITH PROPOFOL;  Surgeon: Christena Deem, MD;  Location: [facility] ENDOSCOPY;  Service: Endoscopy;  Laterality: N/A;   ESOPHAGOGASTRODUODENOSCOPY N/A 11/28/2014   Procedure: ESOPHAGOGASTRODUODENOSCOPY (EGD);  Surgeon: Scot Jun, MD;  Location: [facility] ENDOSCOPY;  Service: Endoscopy;  Laterality: N/A;   ESOPHAGOGASTRODUODENOSCOPY N/A 02/21/2016   Procedure: ESOPHAGOGASTRODUODENOSCOPY (EGD);  Surgeon: Napoleon Form, MD;  Location: [facility] ENDOSCOPY;  Service: Endoscopy;  Laterality: N/A;   ESOPHAGOGASTRODUODENOSCOPY N/A 04/21/2016   Procedure: ESOPHAGOGASTRODUODENOSCOPY (EGD);  Surgeon: Iva Boop, MD;  Location: [facility] ENDOSCOPY;  Service: Endoscopy;  Laterality: N/A;   ESOPHAGOGASTRODUODENOSCOPY N/A 05/06/2016   Procedure: ESOPHAGOGASTRODUODENOSCOPY (EGD);  Surgeon: Wyline Mood, MD;  Location: [facility] ENDOSCOPY;  Service: Gastroenterology;  Laterality: N/A;   ESOPHAGOGASTRODUODENOSCOPY N/A 06/13/2016   Procedure: ESOPHAGOGASTRODUODENOSCOPY (EGD);  Surgeon: Midge Minium, MD;  Location: [facility] ENDOSCOPY;  Service: Endoscopy;  Laterality: N/A;   ESOPHAGOGASTRODUODENOSCOPY N/A 01/14/2022   Procedure: ESOPHAGOGASTRODUODENOSCOPY (EGD);  Surgeon: Toledo, Boykin Nearing, MD;  Location: [facility] ENDOSCOPY;  Service: Gastroenterology;  Laterality: N/A;   ESOPHAGOGASTRODUODENOSCOPY (EGD) WITH PROPOFOL N/A 02/29/2016   Procedure: ESOPHAGOGASTRODUODENOSCOPY (EGD) WITH PROPOFOL;  Surgeon: Midge Minium, MD;  Location: [facility] ENDOSCOPY;  Service: Endoscopy;  Laterality: N/A;   ESOPHAGOGASTRODUODENOSCOPY (EGD) WITH PROPOFOL N/A 01/15/2018   Procedure:  ESOPHAGOGASTRODUODENOSCOPY (EGD) WITH PROPOFOL;  Surgeon: Corbin Ade, MD;  Location: [facility] ENDO SUITE;  Service: Endoscopy;  Laterality: N/A;  retrieval forein body   LAPAROSCOPIC LYSIS OF ADHESIONS  02/16/2022   Procedure: LAPAROSCOPIC LYSIS OF ADHESIONS;  Surgeon:  Henrene Dodge, MD;  Location: [facility] ORS;  Service: General;;   LAPAROTOMY N/A 09/12/2015   Procedure: EXPLORATORY LAPAROTOMY;  Surgeon: Lattie Haw, MD;  Location: [facility] ORS;  Service: General;  Laterality: N/A;   SIGMOIDOSCOPY N/A 09/12/2015   Procedure: Daiva Huge;  Surgeon: Lattie Haw, MD;  Location: [facility] ORS;  Service: General;  Laterality: N/A;   WISDOM TOOTH EXTRACTION      Current Medications: Current Meds  Medication Sig   albuterol (VENTOLIN HFA) 108 (90 Base) MCG/ACT inhaler 2 puffs four times a day as needed   ARIPiprazole (ABILIFY) 5 MG tablet Take 5 mg by mouth daily.   cabergoline (DOSTINEX) 0.5 MG tablet Take 1 tablet (0.5 mg total) by mouth once a week.   escitalopram (LEXAPRO) 10 MG tablet Take 10 mg by mouth daily.   fluticasone-salmeterol (ADVAIR HFA) 45-21 MCG/ACT inhaler Inhale 2 puffs into the lungs 2 (two) times daily.   hydrOXYzine (VISTARIL) 50 MG capsule Take 50 mg by mouth 2 (two) times daily as needed.   levothyroxine (SYNTHROID) 75 MCG tablet Take 1 tablet (75 mcg total) by mouth daily before breakfast.   lithium carbonate 300 MG capsule Take 1 capsule (300 mg total) by mouth 2 (two) times daily with a meal.   magnesium oxide (MAG-OX) 400 MG tablet Take 400 mg by mouth daily.   meloxicam (MOBIC) 15 MG tablet Take 1 tablet (15 mg total) by mouth daily.   omeprazole (PRILOSEC) 20 MG capsule TAKE 1 CAPSULE (20 MG TOTAL) BY MOUTH DAILY.   ondansetron (ZOFRAN-ODT) 4 MG disintegrating tablet Take 1 tablet (4 mg total) by mouth every 8 (eight) hours as needed for nausea or vomiting.   prazosin (MINIPRESS) 2 MG capsule Take 1 capsule (2 mg total) by mouth at bedtime.   pregabalin (LYRICA) 50 MG capsule Take 50 mg by mouth 2 (two) times daily.   silver sulfADIAZINE (SILVADENE) 1 % cream Apply 1 Application topically daily.   SUMAtriptan (IMITREX) 20 MG/ACT nasal spray Place 20 mg into the nose as needed.   topiramate (TOPAMAX) 25 MG tablet Take 25 mg by  mouth 2 (two) times daily.   triamcinolone cream (KENALOG) 0.1 % Apply 1 Application topically 4 (four) times daily.     Allergies:   Betadine [povidone iodine], Fish allergy, and Shellfish-derived products   Social History   Socioeconomic History   Marital status: Married    Spouse name: Not on file   Number of children: 0   Years of education: 12   Highest education level: Not on file  Occupational History   Occupation: unemployed  Tobacco Use   Smoking status: Former    Packs/day: 0.50    Years: 3.00    Additional pack years: 0.00    Total pack years: 1.50    Types: Cigarettes    Quit date: 08/14/2016    Years since quitting: 6.1    Passive exposure: Past   Smokeless tobacco: Never  Vaping Use   Vaping Use: Never used  Substance and Sexual Activity   Alcohol use: No   Drug use: No   Sexual activity: Yes    Partners: Male    Birth control/protection: None  Other Topics Concern   Not on file  Social  History Narrative   Right handed    One story home   Drinks occasional caffeine   Social Determinants of Health   Financial Resource Strain: Low Risk  (07/08/2022)   Overall Financial Resource Strain (CARDIA)    Difficulty of Paying Living Expenses: Not hard at all  Food Insecurity: No Food Insecurity (07/08/2022)   Hunger Vital Sign    Worried About Running Out of Food in the Last Year: Never true    Ran Out of Food in the Last Year: Never true  Transportation Needs: No Transportation Needs (09/21/2022)   PRAPARE - Administrator, Civil Service (Medical): No    Lack of Transportation (Non-Medical): No  Physical Activity: Insufficiently Active (01/05/2022)   Exercise Vital Sign    Days of Exercise per Week: 2 days    Minutes of Exercise per Session: 60 min  Stress: No Stress Concern Present (08/22/2022)   [employer] of Occupational Health - Occupational Stress Questionnaire    Feeling of Stress : Only a little  Social Connections: Moderately  Integrated (09/21/2022)   Social Connection and Isolation Panel [NHANES]    Frequency of Communication with Friends and Family: More than three times a week    Frequency of Social Gatherings with Friends and Family: More than three times a week    Attends Religious Services: More than 4 times per year    Active Member of [employer] or Organizations: No    Attends Engineer, structural: Never    Marital Status: Married     Family History: The patient's family history includes Anxiety disorder in her mother; Arthritis in her mother; Asthma in her mother; Breast cancer in her maternal aunt; COPD in her mother; Depression in her mother; Diabetes in her mother; Heart attack in her maternal grandfather; Heart failure in her mother; Hypertension in her mother; Sleep apnea in her mother; Stroke in her maternal grandmother.  ROS:   Please see the history of present illness.     All other systems reviewed and are negative.  EKGs/Labs/Other Studies Reviewed:    The following studies were reviewed today:   EKG:  EKG is  ordered today.  The ekg ordered today demonstrates normal sinus rhythm, normal ECG, heart rate 73  Recent Labs: 05/02/2022: TSH 1.12 08/22/2022: ALT 10 08/30/2022: BUN 16; Creatinine, Ser 0.70; Hemoglobin 14.0; Platelets 180; Potassium 3.7; Sodium 140  Recent Lipid Panel    Component Value Date/Time   CHOL 170 09/30/2021 1124   CHOL 185 09/03/2014 0824   TRIG 145 09/30/2021 1124   TRIG 98 09/03/2014 0824   HDL 35 (L) 09/30/2021 1124   HDL 39 (L) 09/03/2014 0824   CHOLHDL 4.9 (H) 09/30/2021 1124   CHOLHDL 4.2 11/23/2017 0127   VLDL 15 11/23/2017 0127   VLDL 20 09/03/2014 0824   LDLCALC 109 (H) 09/30/2021 1124   LDLCALC 126 (H) 09/03/2014 0824     Risk Assessment/Calculations:             Physical Exam:    VS:  BP 124/80 (BP Location: Right Arm, Patient Position: Sitting, Cuff Size: Large)   Pulse 73   Ht 5\' 7"  (1.702 m)   Wt 281 lb 6.4 oz (127.6 kg)   LMP  07/15/2022 (Exact Date) Comment: pt stated she got a +at home preg test that hasnt been confirmed yet 08/30/22  SpO2 98%   BMI 44.07 kg/m     Wt Readings from Last 3 Encounters:  09/28/22 281 lb  6.4 oz (127.6 kg)  09/13/22 269 lb (122 kg)  09/06/22 274 lb (124.3 kg)     GEN:  Well nourished, well developed in no acute distress HEENT: Normal NECK: No JVD; No carotid bruits CARDIAC: RRR, no murmurs, rubs, gallops RESPIRATORY:  Clear to auscultation without rales, wheezing or rhonchi  ABDOMEN: Soft, non-tender, non-distended MUSCULOSKELETAL:  No edema; No deformit SKIN: Warm and dry NEUROLOGIC:  Alert and oriented x 3 PSYCHIATRIC:  Normal affect   ASSESSMENT:    1. Palpitations   2. Morbid obesity (HCC)    PLAN:    In order of problems listed above:  Palpitations, place cardiac monitor to evaluate any significant arrhythmias. Morbid obesity, low-calorie diet, weight loss advised.  Follow-up after cardiac monitor.      Medication Adjustments/Labs and Tests Ordered: Current medicines are reviewed at length with the patient today.  Concerns regarding medicines are outlined above.  Orders Placed This Encounter  Procedures   LONG TERM MONITOR (3-14 DAYS)   EKG 12-Lead   No orders of the defined types were placed in this encounter.   Patient Instructions  Medication Instructions:   Your physician recommends that you continue on your current medications as directed. Please refer to the Current Medication list given to you today.  *If you need a refill on your cardiac medications before your next appointment, please call your pharmacy*   Lab Work:  None Ordered  If you have labs (blood work) drawn today and your tests are completely normal, you will receive your results only by: MyChart Message (if you have MyChart) OR A paper copy in the mail If you have any lab test that is abnormal or we need to change your treatment, we will call you to review the  results.   Testing/Procedures:  Your physician has recommended that you wear a Zio monitor.   This monitor is a medical device that records the heart's electrical activity. Doctors most often use these monitors to diagnose arrhythmias. Arrhythmias are problems with the speed or rhythm of the heartbeat. The monitor is a small device applied to your chest. You can wear one while you do your normal daily activities. While wearing this monitor if you have any symptoms to push the button and record what you felt. Once you have worn this monitor for the period of time provider prescribed (Usually 14 days), you will return the monitor device in the postage paid box. Once it is returned they will download the data collected and provide [name] with a report which the provider will then review and we will call you with those results. Important tips:  Avoid showering during the first 24 hours of wearing the monitor. Avoid excessive sweating to help maximize wear time. Do not submerge the device, no hot tubs, and no swimming pools. Keep any lotions or oils away from the patch. After 24 hours you may shower with the patch on. Take brief showers with your back facing the shower head.  Do not remove patch once it has been placed because that will interrupt data and decrease adhesive wear time. Push the button when you have any symptoms and write down what you were feeling. Once you have completed wearing your monitor, remove and place into box which has postage paid and place in your outgoing mailbox.  If for some reason you have misplaced your box then call our office and we can provide another box and/or mail it off for you.      Follow-Up:  At [facility], you and your health needs are our priority.  As part of our continuing mission to provide you with exceptional heart care, we have created designated Provider Care Teams.  These Care Teams include your primary Cardiologist (physician) and Advanced  Practice Providers (APPs -  Physician Assistants and Nurse Practitioners) who all work together to provide you with the care you need, when you need it.  We recommend signing up for the patient portal called "MyChart".  Sign up information is provided on this After Visit Summary.  MyChart is used to connect with patients for Virtual Visits (Telemedicine).  Patients are able to view lab/test results, encounter notes, upcoming appointments, etc.  Non-urgent messages can be sent to your provider as well.   To learn more about what you can do with MyChart, go to [email or web address].    Your next appointment:    After testing  Provider:   You may see Debbe Odea, MD or one of the following Advanced Practice Providers on your designated Care Team:   Nicolasa Ducking, NP Eula Listen, PA-C Cadence Fransico Michael, PA-C Charlsie Quest, NP    Signed, Debbe Odea, MD  09/28/2022 12:49 PM    [facility]

## 2022-09-28 NOTE — Patient Instructions (Signed)
Medication Instructions:   Your physician recommends that you continue on your current medications as directed. Please refer to the Current Medication list given to you today.  *If you need a refill on your cardiac medications before your next appointment, please call your pharmacy*   Lab Work:  None Ordered  If you have labs (blood work) drawn today and your tests are completely normal, you will receive your results only by: MyChart Message (if you have MyChart) OR A paper copy in the mail If you have any lab test that is abnormal or we need to change your treatment, we will call you to review the results.   Testing/Procedures:  Your physician has recommended that you wear a Zio monitor.   This monitor is a medical device that records the heart's electrical activity. Doctors most often use these monitors to diagnose arrhythmias. Arrhythmias are problems with the speed or rhythm of the heartbeat. The monitor is a small device applied to your chest. You can wear one while you do your normal daily activities. While wearing this monitor if you have any symptoms to push the button and record what you felt. Once you have worn this monitor for the period of time provider prescribed (Usually 14 days), you will return the monitor device in the postage paid box. Once it is returned they will download the data collected and provide [name] with a report which the provider will then review and we will call you with those results. Important tips:  Avoid showering during the first 24 hours of wearing the monitor. Avoid excessive sweating to help maximize wear time. Do not submerge the device, no hot tubs, and no swimming pools. Keep any lotions or oils away from the patch. After 24 hours you may shower with the patch on. Take brief showers with your back facing the shower head.  Do not remove patch once it has been placed because that will interrupt data and decrease adhesive wear time. Push the button  when you have any symptoms and write down what you were feeling. Once you have completed wearing your monitor, remove and place into box which has postage paid and place in your outgoing mailbox.  If for some reason you have misplaced your box then call our office and we can provide another box and/or mail it off for you.      Follow-Up: At [facility], you and your health needs are our priority.  As part of our continuing mission to provide you with exceptional heart care, we have created designated Provider Care Teams.  These Care Teams include your primary Cardiologist (physician) and Advanced Practice Providers (APPs -  Physician Assistants and Nurse Practitioners) who all work together to provide you with the care you need, when you need it.  We recommend signing up for the patient portal called "MyChart".  Sign up information is provided on this After Visit Summary.  MyChart is used to connect with patients for Virtual Visits (Telemedicine).  Patients are able to view lab/test results, encounter notes, upcoming appointments, etc.  Non-urgent messages can be sent to your provider as well.   To learn more about what you can do with MyChart, go to [email or web address].    Your next appointment:    After testing  Provider:   You may see Debbe Odea, MD or one of the following Advanced Practice Providers on your designated Care Team:   Nicolasa Ducking, NP Eula Listen, PA-C Cadence Fransico Michael, PA-C Charlsie Quest, NP

## 2022-09-28 NOTE — Transitions of Care (Post Inpatient/ED Visit) (Signed)
09/28/2022  [name] MRN: [id number] DOB: 1991-02-24  Today's TOC FU Call Status: Today's TOC FU Call Status:: Successful TOC FU Call Competed TOC FU Call Complete Date: 09/28/22  Transition Care Management Follow-up Telephone Call Date of Discharge: 09/27/22 Discharge Facility: Other ([facility]) Name of Other ([facility]) [facility] Facility: [facility] Type of Discharge: Emergency Department Reason for ED Visit: Neurologic How have you been since you were released from the hospital?: Better Any questions or concerns?: No  Items Reviewed: Did you receive and understand the discharge instructions provided?: Yes Medications obtained,verified, and reconciled?: Yes (Medications Reviewed) Any new allergies since your discharge?: No Dietary orders reviewed?: No Do you have support at home?: Yes People in Home: spouse  Medications Reviewed Today: Medications Reviewed Today     Reviewed by Annabell Sabal, CMA (Certified Medical Assistant) on 09/28/22 at 1258  Med List Status: <None>   Medication Order Taking? Sig Documenting Provider Last Dose Status Informant  albuterol (VENTOLIN HFA) 108 (90 Base) MCG/ACT inhaler [id number] Yes 2 puffs four times a day as needed [name], MD Taking Active Self  ARIPiprazole (ABILIFY) 5 MG tablet [id number] Yes Take 5 mg by mouth daily. [provider] Taking Active Self  benzonatate (TESSALON PERLES) 100 MG capsule [id number] Yes Take 1 capsule (100 mg total) by mouth 3 (three) times daily as needed for cough. Racheal Patches, PA-C Taking Active   brompheniramine-pseudoephedrine-DM 30-2-10 MG/5ML syrup [id number] Yes Take 10 mLs by mouth 4 (four) times daily as needed. Cuthriell, Delorise Royals, PA-C Taking Active   cabergoline (DOSTINEX) 0.5 MG tablet [id number] Yes Take 1 tablet (0.5 mg total) by mouth once a week. Shamleffer, Konrad Dolores, MD Taking Active   cetirizine (ZYRTEC) 10 MG tablet [id number]  Take 4  tablets (40 mg total) by mouth daily for 5 days.  Patient not taking: Reported on 09/28/2022   Racheal Patches, PA-C  Expired 07/30/22 2359   escitalopram (LEXAPRO) 10 MG tablet [id number] Yes Take 10 mg by mouth daily. [provider] Taking Active Self  famotidine (PEPCID) 20 MG tablet [id number]  Take 1 tablet (20 mg total) by mouth daily for 5 days.  Patient not taking: Reported on 09/28/2022   Racheal Patches, PA-C  Expired 09/13/22 2359   fluticasone-salmeterol (ADVAIR HFA) 45-21 MCG/ACT inhaler [id number] Yes Inhale 2 puffs into the lungs 2 (two) times daily. [name], MD Taking Active Self  hydrOXYzine (VISTARIL) 50 MG capsule [id number] Yes Take 50 mg by mouth 2 (two) times daily as needed. [provider] Taking Active Self  levothyroxine (SYNTHROID) 75 MCG tablet [id number] Yes Take 1 tablet (75 mcg total) by mouth daily before breakfast. Shamleffer, Konrad Dolores, MD Taking Active   lithium carbonate 300 MG capsule [id number] Yes Take 1 capsule (300 mg total) by mouth 2 (two) times daily with a meal. [name], [name], MD Taking Active Self  magnesium oxide (MAG-OX) 400 MG tablet [id number] Yes Take 400 mg by mouth daily. [provider] Taking Active Self  meloxicam (MOBIC) 15 MG tablet [id number] Yes Take 1 tablet (15 mg total) by mouth daily. Varney Daily, PA Taking Active   omeprazole (PRILOSEC) 20 MG capsule [id number] Yes TAKE 1 CAPSULE (20 MG TOTAL) BY MOUTH DAILY. [name], MD Taking Active   ondansetron [facility]) 4 MG tablet [id number] Yes Take 1 tablet (4 mg total) by mouth daily as needed for nausea or vomiting. Pilar Jarvis, MD Taking Active   ondansetron (ZOFRAN-ODT) 4  MG disintegrating tablet [id number] Yes Take 1 tablet (4 mg total) by mouth every 8 (eight) hours as needed for nausea or vomiting. Faythe Ghee, PA-C Taking Active   prazosin (MINIPRESS) 2 MG capsule [id number] Yes Take 1 capsule (2 mg total) by mouth  at bedtime. McNew, Ileene Hutchinson, MD Taking Active Self  predniSONE (DELTASONE) 50 MG tablet [id number] Yes Take 1 tablet (50 mg total) by mouth daily with breakfast. Cuthriell, Delorise Royals, PA-C Taking Active   pregabalin (LYRICA) 50 MG capsule [id number] Yes Take 50 mg by mouth 2 (two) times daily. [provider] Taking Active Self  silver sulfADIAZINE (SILVADENE) 1 % cream [id number] Yes Apply 1 Application topically daily. Radford Pax, NP Taking Active   sucralfate (CARAFATE) 1 g tablet [id number] Yes Take 1 tablet (1 g total) by mouth 4 (four) times daily. Phineas Semen, MD Taking Active   SUMAtriptan [facility]) 20 MG/ACT nasal spray [id number] Yes Place 20 mg into the nose as needed. [provider] Taking Active Self  topiramate (TOPAMAX) 25 MG tablet [id number] Yes Take 25 mg by mouth 2 (two) times daily. [provider] Taking Active Self  triamcinolone cream (KENALOG) 0.1 % [id number] Yes Apply 1 Application topically 4 (four) times daily. Cuthriell, Delorise Royals, PA-C Taking Active             Home Care and Equipment/Supplies: Were Home Health Services Ordered?: No Any new equipment or medical supplies ordered?: No  Functional Questionnaire: Do you need assistance with bathing/showering or dressing?: No Do you need assistance with meal preparation?: No Do you need assistance with eating?: No Do you have difficulty maintaining continence: No Do you need assistance with getting out of bed/getting out of a chair/moving?: No Do you have difficulty managing or taking your medications?: No  Follow up appointments reviewed: PCP Follow-up appointment confirmed?: NA Specialist Hospital Follow-up appointment confirmed?: NA Reason Specialist Follow-Up Not Confirmed: Patient has Specialist Provider Number and will Call for Appointment Do you need transportation to your follow-up appointment?: No Do you understand care options if your condition(s) worsen?: Yes-patient  verbalized understanding    SIGNATURE Elimelech Houseman,CMA [facility] ,[facility]

## 2022-09-30 ENCOUNTER — Emergency Department
Admission: EM | Admit: 2022-09-30 | Discharge: 2022-09-30 | Disposition: A | Discharge status: Home / Self Care | Payer: Medicaid Other | Attending: Emergency Medicine | Admitting: Emergency Medicine

## 2022-09-30 ENCOUNTER — Other Ambulatory Visit: Payer: Self-pay

## 2022-09-30 DIAGNOSIS — R258 Other abnormal involuntary movements: Secondary | ICD-10-CM | POA: Diagnosis not present

## 2022-09-30 DIAGNOSIS — R110 Nausea: Secondary | ICD-10-CM | POA: Diagnosis not present

## 2022-09-30 DIAGNOSIS — E039 Hypothyroidism, unspecified: Secondary | ICD-10-CM | POA: Diagnosis not present

## 2022-09-30 DIAGNOSIS — R569 Unspecified convulsions: Secondary | ICD-10-CM

## 2022-09-30 DIAGNOSIS — R4020 Unspecified coma: Secondary | ICD-10-CM

## 2022-09-30 DIAGNOSIS — R55 Syncope and collapse: Secondary | ICD-10-CM | POA: Insufficient documentation

## 2022-09-30 DIAGNOSIS — R519 Headache, unspecified: Secondary | ICD-10-CM | POA: Insufficient documentation

## 2022-09-30 LAB — CBC WITH DIFFERENTIAL/PLATELET
Abs Immature Granulocytes: 0.04 10*3/uL (ref 0.00–0.07)
Basophils Absolute: 0 10*3/uL (ref 0.0–0.1)
Basophils Relative: 0 %
Eosinophils Absolute: 0.1 10*3/uL (ref 0.0–0.5)
Eosinophils Relative: 1 %
HCT: 43.6 % (ref 36.0–46.0)
Hemoglobin: 14.2 g/dL (ref 12.0–15.0)
Immature Granulocytes: 0 %
Lymphocytes Relative: 22 %
Lymphs Abs: 2.1 10*3/uL (ref 0.7–4.0)
MCH: 30.2 pg (ref 26.0–34.0)
MCHC: 32.6 g/dL (ref 30.0–36.0)
MCV: 92.8 fL (ref 80.0–100.0)
Monocytes Absolute: 0.5 10*3/uL (ref 0.1–1.0)
Monocytes Relative: 6 %
Neutro Abs: 6.6 10*3/uL (ref 1.7–7.7)
Neutrophils Relative %: 71 %
Platelets: 217 10*3/uL (ref 150–400)
RBC: 4.7 MIL/uL (ref 3.87–5.11)
RDW: 12.6 % (ref 11.5–15.5)
WBC: 9.4 10*3/uL (ref 4.0–10.5)
nRBC: 0 % (ref 0.0–0.2)

## 2022-09-30 LAB — COMPREHENSIVE METABOLIC PANEL
ALT: 10 U/L (ref 0–44)
AST: 17 U/L (ref 15–41)
Albumin: 4 g/dL (ref 3.5–5.0)
Alkaline Phosphatase: 71 U/L (ref 38–126)
Anion gap: 8 (ref 5–15)
BUN: 16 mg/dL (ref 6–20)
CO2: 24 mmol/L (ref 22–32)
Calcium: 9.7 mg/dL (ref 8.9–10.3)
Chloride: 107 mmol/L (ref 98–111)
Creatinine, Ser: 0.73 mg/dL (ref 0.44–1.00)
GFR, Estimated: >60 mL/min (ref >60)
Glucose, Bld: 90 mg/dL (ref 70–99)
Potassium: 4.1 mmol/L (ref 3.5–5.1)
Sodium: 139 mmol/L (ref 135–145)
Total Bilirubin: 0.5 mg/dL (ref 0.3–1.2)
Total Protein: 7.5 g/dL (ref 6.5–8.1)

## 2022-09-30 LAB — URINE DRUG SCREEN, QUALITATIVE (ARMC ONLY)
Amphetamines, Ur Screen: NOT DETECTED
Barbiturates, Ur Screen: NOT DETECTED
Benzodiazepine, Ur Scrn: NOT DETECTED
Cannabinoid 50 Ng, Ur ~~LOC~~: NOT DETECTED
Cocaine Metabolite,Ur ~~LOC~~: NOT DETECTED
MDMA (Ecstasy)Ur Screen: NOT DETECTED
Methadone Scn, Ur: NOT DETECTED
Opiate, Ur Screen: NOT DETECTED
Phencyclidine (PCP) Ur S: NOT DETECTED
Tricyclic, Ur Screen: NOT DETECTED

## 2022-09-30 LAB — URINALYSIS, W/ REFLEX TO CULTURE (INFECTION SUSPECTED)
Bacteria, UA: NONE SEEN
Bilirubin Urine: NEGATIVE
Glucose, UA: NEGATIVE mg/dL
Ketones, ur: NEGATIVE mg/dL
Leukocytes,Ua: NEGATIVE
Nitrite: NEGATIVE
Protein, ur: NEGATIVE mg/dL
Specific Gravity, Urine: 1.014 (ref 1.005–1.030)
pH: 6 (ref 5.0–8.0)

## 2022-09-30 LAB — HCG, QUANTITATIVE, PREGNANCY: hCG, Beta Chain, Quant, S: <1 m[IU]/mL (ref <5)

## 2022-09-30 LAB — LACTIC ACID, PLASMA: Lactic Acid, Venous: 0.8 mmol/L (ref 0.5–1.9)

## 2022-09-30 NOTE — ED Notes (Signed)
EDP at [facility]. Alert, NAD, calm, no changes.

## 2022-09-30 NOTE — ED Provider Notes (Signed)
[facility] Provider Note   Event Date/Time   First MD Initiated Contact with Patient 09/30/22 1103     (approximate) History  possible seizure  HPI Melanie Bell is a 32 y.o. female with a past medical history of PTSD, borderline personality disorder, schizoaffective disorder, reflux, morbid obesity, a and hypothyroidism who presents via EMS for concerns for loss of consciousness.  Patient states that she believes that she had a seizure a couple days ago as well as woke up this morning on the floor and vomited and urinated on herself.  Patient states that the back of her head hurts with a 7/10 pain.  Patient also complains of mild nausea ROS: Patient currently denies any vision changes, tinnitus, difficulty speaking, facial droop, sore throat, chest pain, shortness of breath, abdominal pain, vomiting/diarrhea, dysuria, or weakness/numbness/paresthesias in any extremity   Physical Exam  Triage Vital Signs: ED Triage Vitals  Enc Vitals Group     BP 09/30/22 1112 (!) 150/105     Pulse Rate 09/30/22 1112 73     Resp 09/30/22 1112 13     Temp 09/30/22 1112 98.4 F (36.9 C)     Temp Source 09/30/22 1112 Oral     SpO2 09/30/22 1112 100 %     Weight 09/30/22 1115 281 lb (127.5 kg)     Height 09/30/22 1115 5\' 7"  (1.702 m)     Head Circumference --      Peak Flow --      Pain Score 09/30/22 1113 7     Pain Loc --      Pain Edu? --      Excl. in GC? --    Most recent vital signs: Vitals:   09/30/22 1305 09/30/22 1310  BP:    Pulse: 69 69  Resp: 14 14  Temp:    SpO2: 99% 100%   General: Awake, oriented x4. CV:  Good peripheral perfusion.  Resp:  Normal effort.  Abd:  No distention.  Other:  Obese middle-aged Caucasian female laying in bed in no acute distress ED Results / Procedures / Treatments  Labs (all labs ordered are listed, but only abnormal results are displayed) Labs Reviewed  URINALYSIS, W/ REFLEX TO CULTURE (INFECTION SUSPECTED) -  Abnormal; Notable for the following components:      Result Value   Color, Urine YELLOW (*)    APPearance CLEAR (*)    Hgb urine dipstick MODERATE (*)    All other components within normal limits  COMPREHENSIVE METABOLIC PANEL  CBC WITH DIFFERENTIAL/PLATELET  URINE DRUG SCREEN, QUALITATIVE ([facility] ONLY)  LACTIC ACID, PLASMA  HCG, QUANTITATIVE, PREGNANCY  LACTIC ACID, PLASMA   EKG ED ECG REPORT I, Merwyn Katos, the attending physician, personally viewed and interpreted this ECG. Date: 09/30/2022 EKG Time: 1113 Rate: 70 Rhythm: normal sinus rhythm QRS Axis: normal Intervals: normal ST/T Wave abnormalities: normal Narrative Interpretation: no evidence of acute ischemia PROCEDURES: Critical Care performed: No .1-3 Lead EKG Interpretation  Performed by: Merwyn Katos, MD Authorized by: Merwyn Katos, MD     Interpretation: normal     ECG rate:  71   ECG rate assessment: normal     Rhythm: sinus rhythm     Ectopy: none     Conduction: normal    MEDICATIONS ORDERED IN ED: Medications - No data to display IMPRESSION / MDM / ASSESSMENT AND PLAN / ED COURSE  I reviewed the triage vital signs and the nursing notes.  The patient is on the cardiac monitor to evaluate for evidence of arrhythmia and/or significant heart rate changes. Patient's presentation is most consistent with acute presentation with potential threat to life or bodily function. Patient presents with complaints of loss of consciousness ED Workup:  CBC, BMP, Troponin, ECG, UA, UDS Differential diagnosis includes HF, ICH, seizure, stroke, HOCM, ACS, aortic dissection, malignant arrhythmia, or GI bleed. Findings: No evidence of acute laboratory abnormalities.  Troponin negative x1 EKG: No e/o STEMI. No evidence of Brugadas sign, delta wave, epsilon wave, significantly prolonged QTc, or malignant arrhythmia.  Disposition: Discharge. Patient is at baseline at this time. Return  precautions expressed and understood in person. Advised follow up with primary care provider or clinic physician in next 24 hours.   FINAL CLINICAL IMPRESSION(S) / ED DIAGNOSES   Final diagnoses:  Seizure-like activity (HCC)  Loss of consciousness (HCC)   Rx / DC Orders   ED Discharge Orders     None      Note:  This document was prepared using Dragon voice recognition software and may include unintentional dictation errors.   Merwyn Katos, MD 09/30/22 [phone]

## 2022-09-30 NOTE — ED Triage Notes (Signed)
Pt arrived via [facility] from home. Pt had possible seizure around 0700 this morning. Pt has hx of seizures and last one was a couple of days ago. Pt stated they woke up on the floor in vomit and had urinated on herself. Pt states the back of her head hurts 7/10 pain. Pt c/o of nausea. No lacerations to back of her head, per EMS.   BP 135/81 BGC 112 HR 69 SpO2 100 on RA

## 2022-09-30 NOTE — ED Notes (Addendum)
Alert, NAD, calm, interactive, resps e/u, speaking in clear complete sentences. No sz activity noted, not postictal. Reports was incontinent, bit R tip of tongue, hit back of head. C/o posterior HA and nausea. Woke up on floor, with vomit on floor.

## 2022-10-03 ENCOUNTER — Telehealth: Payer: Self-pay

## 2022-10-03 NOTE — Transitions of Care (Post Inpatient/ED Visit) (Signed)
10/03/2022  [name] MRN: [id number] DOB: 1991-04-30  Today's TOC FU Call Status: Today's TOC FU Call Status:: Successful TOC FU Call Competed TOC FU Call Complete Date: 10/03/22  Transition Care Management Follow-up Telephone Call Date of Discharge: 09/30/22 Discharge Facility: [facility]) Type of Discharge: Emergency Department Reason for ED Visit: Other: (convulsions) How have you been since you were released from the hospital?: Better Any questions or concerns?: No  Items Reviewed: Did you receive and understand the discharge instructions provided?: Yes Medications obtained,verified, and reconciled?: Yes (Medications Reviewed) Any new allergies since your discharge?: No Dietary orders reviewed?: Yes Do you have support at home?: Yes People in Home: spouse  Medications Reviewed Today: Medications Reviewed Today     Reviewed by Karena Addison, LPN (Licensed Practical Nurse) on 10/03/22 at 1508  Med List Status: <None>   Medication Order Taking? Sig Documenting Provider Last Dose Status Informant  albuterol (VENTOLIN HFA) 108 (90 Base) MCG/ACT inhaler [id number] Yes 2 puffs four times a day as needed [name], MD Taking Active Self  ARIPiprazole (ABILIFY) 5 MG tablet [id number] Yes Take 5 mg by mouth daily. [provider] Taking Active Self  benzonatate (TESSALON PERLES) 100 MG capsule [id number] Yes Take 1 capsule (100 mg total) by mouth 3 (three) times daily as needed for cough. Racheal Patches, PA-C Taking Active   brompheniramine-pseudoephedrine-DM 30-2-10 MG/5ML syrup [id number] Yes Take 10 mLs by mouth 4 (four) times daily as needed. Cuthriell, Delorise Royals, PA-C Taking Active   cabergoline (DOSTINEX) 0.5 MG tablet [id number] Yes Take 1 tablet (0.5 mg total) by mouth once a week. Shamleffer, Konrad Dolores, MD Taking Active   cetirizine (ZYRTEC) 10 MG tablet [id number]  Take 4 tablets (40 mg total) by mouth  daily for 5 days.  Patient not taking: Reported on 09/28/2022   Racheal Patches, PA-C  Expired 07/30/22 2359   escitalopram (LEXAPRO) 10 MG tablet [id number] Yes Take 10 mg by mouth daily. [provider] Taking Active Self  famotidine (PEPCID) 20 MG tablet [id number]  Take 1 tablet (20 mg total) by mouth daily for 5 days.  Patient not taking: Reported on 09/28/2022   Racheal Patches, PA-C  Expired 09/13/22 2359   fluticasone-salmeterol (ADVAIR HFA) 45-21 MCG/ACT inhaler [id number] Yes Inhale 2 puffs into the lungs 2 (two) times daily. [name], MD Taking Active Self  hydrOXYzine (VISTARIL) 50 MG capsule [id number] Yes Take 50 mg by mouth 2 (two) times daily as needed. [provider] Taking Active Self  levothyroxine (SYNTHROID) 75 MCG tablet [id number] Yes Take 1 tablet (75 mcg total) by mouth daily before breakfast. Shamleffer, Konrad Dolores, MD Taking Active   lithium carbonate 300 MG capsule [id number] Yes Take 1 capsule (300 mg total) by mouth 2 (two) times daily with a meal. [name], [name], MD Taking Active Self  magnesium oxide (MAG-OX) 400 MG tablet [id number] Yes Take 400 mg by mouth daily. [provider] Taking Active Self  meloxicam (MOBIC) 15 MG tablet [id number] Yes Take 1 tablet (15 mg total) by mouth daily. Varney Daily, PA Taking Active   omeprazole (PRILOSEC) 20 MG capsule [id number] Yes TAKE 1 CAPSULE (20 MG TOTAL) BY MOUTH DAILY. [name], MD Taking Active   ondansetron [facility]) 4 MG tablet [id number] Yes Take 1 tablet (4 mg total) by mouth daily as needed for nausea or vomiting. Pilar Jarvis, MD Taking Active   ondansetron (ZOFRAN-ODT) 4 MG disintegrating tablet [id number]  Yes Take 1 tablet (4 mg total) by mouth every 8 (eight) hours as needed for nausea or vomiting. Faythe Ghee, PA-C Taking Active   prazosin (MINIPRESS) 2 MG capsule [id number] Yes Take 1 capsule (2 mg total) by mouth at bedtime. McNew, Ileene Hutchinson, MD  Taking Active Self  predniSONE (DELTASONE) 50 MG tablet [id number] Yes Take 1 tablet (50 mg total) by mouth daily with breakfast. Cuthriell, Delorise Royals, PA-C Taking Active   pregabalin (LYRICA) 50 MG capsule [id number] Yes Take 50 mg by mouth 2 (two) times daily. [provider] Taking Active Self  silver sulfADIAZINE (SILVADENE) 1 % cream [id number] Yes Apply 1 Application topically daily. Radford Pax, NP Taking Active   sucralfate (CARAFATE) 1 g tablet [id number] Yes Take 1 tablet (1 g total) by mouth 4 (four) times daily. Phineas Semen, MD Taking Active   SUMAtriptan [facility]) 20 MG/ACT nasal spray [id number] Yes Place 20 mg into the nose as needed. [provider] Taking Active Self  topiramate (TOPAMAX) 25 MG tablet [id number] Yes Take 25 mg by mouth 2 (two) times daily. [provider] Taking Active Self  triamcinolone cream (KENALOG) 0.1 % [id number] Yes Apply 1 Application topically 4 (four) times daily. Cuthriell, Delorise Royals, PA-C Taking Active             Home Care and Equipment/Supplies: Were Home Health Services Ordered?: NA Any new equipment or medical supplies ordered?: NA  Functional Questionnaire: Do you need assistance with bathing/showering or dressing?: No Do you need assistance with meal preparation?: No Do you need assistance with eating?: No Do you have difficulty maintaining continence: No Do you need assistance with getting out of bed/getting out of a chair/moving?: No  Follow up appointments reviewed: PCP Follow-up appointment confirmed?: NA Specialist Hospital Follow-up appointment confirmed?: Yes Date of Specialist follow-up appointment?: 10/04/22 Follow-Up Specialty Provider:: Neuro Do you need transportation to your follow-up appointment?: No Do you understand care options if your condition(s) worsen?: Yes-patient verbalized understanding    SIGNATURE Karena Addison, LPN [facility] Nurse Health Advisor Direct Dial [phone]

## 2022-10-04 DIAGNOSIS — R002 Palpitations: Secondary | ICD-10-CM

## 2022-10-06 ENCOUNTER — Ambulatory Visit: Payer: Medicaid Other

## 2022-10-12 ENCOUNTER — Ambulatory Visit: Payer: Medicaid Other | Admitting: Physical Therapy

## 2022-10-13 ENCOUNTER — Ambulatory Visit: Payer: Medicaid Other

## 2022-10-17 ENCOUNTER — Ambulatory Visit: Payer: Medicaid Other

## 2022-10-19 ENCOUNTER — Ambulatory Visit: Payer: Medicaid Other

## 2022-10-23 ENCOUNTER — Telehealth: Payer: Medicaid Other | Admitting: Nurse Practitioner

## 2022-10-23 DIAGNOSIS — J40 Bronchitis, not specified as acute or chronic: Secondary | ICD-10-CM | POA: Diagnosis not present

## 2022-10-23 DIAGNOSIS — J069 Acute upper respiratory infection, unspecified: Secondary | ICD-10-CM

## 2022-10-23 MED ORDER — AZITHROMYCIN 250 MG PO TABS
ORAL_TABLET | ORAL | 0 refills | Status: DC
Start: 2022-10-23 — End: 2022-10-24

## 2022-10-23 MED ORDER — COVID-19 AT HOME ANTIGEN TEST VI KIT: PACK | 0 refills | Status: DC

## 2022-10-23 MED ORDER — PSEUDOEPH-BROMPHEN-DM 30-2-10 MG/5ML PO SYRP
10.0000 mL | ORAL_SOLUTION | Freq: Four times a day (QID) | ORAL | 0 refills | Status: DC | PRN
Start: 2022-10-23 — End: 2022-10-24

## 2022-10-23 MED ORDER — PREDNISONE 20 MG PO TABS
20.0000 mg | ORAL_TABLET | Freq: Every day | ORAL | 0 refills | Status: AC
Start: 2022-10-23 — End: 2022-10-28

## 2022-10-23 NOTE — Progress Notes (Signed)
Virtual Visit Consent   Melanie Bell, you are scheduled for a virtual visit with a [facility] provider today. Just as with appointments in the office, your consent must be obtained to participate. Your consent will be active for this visit and any virtual visit you may have with one of our providers in the next 365 days. If you have a MyChart account, a copy of this consent can be sent to you electronically.  As this is a virtual visit, video technology does not allow for your provider to perform a traditional examination. This may limit your provider's ability to fully assess your condition. If your provider identifies any concerns that need to be evaluated in person or the need to arrange testing (such as labs, EKG, etc.), we will make arrangements to do so. Although advances in technology are sophisticated, we cannot ensure that it will always work on either your end or our end. If the connection with a video visit is poor, the visit may have to be switched to a telephone visit. With either a video or telephone visit, we are not always able to ensure that we have a secure connection.  By engaging in this virtual visit, you consent to the provision of healthcare and authorize for your insurance to be billed (if applicable) for the services provided during this visit. Depending on your insurance coverage, you may receive a charge related to this service.  I need to obtain your verbal consent now. Are you willing to proceed with your visit today? Melanie Bell has provided verbal consent on 10/23/2022 for a virtual visit (video or telephone). Melanie Rigg, NP  Date: 10/23/2022 12:55 PM  Virtual Visit via Video Note   I, Melanie Bell, connected with  Melanie Bell  ([id number], 1990/07/22) on 10/23/22 at  1:00 PM EDT by a video-enabled telemedicine application and verified that I am speaking with the correct person using two identifiers.  Location: Patient: Virtual Visit  Location Patient: Home Provider: Virtual Visit Location Provider: Home Office   I discussed the limitations of evaluation and management by telemedicine and the availability of in person appointments. The patient expressed understanding and agreed to proceed.    History of Present Illness: Melanie Bell is a 32 y.o. who identifies as a female who was assigned female at birth, and is being seen today for URI with cough and congestion.  Ms. Chamber notes the following symptoms over the past several days: persistent cough, sore throat, wheezing, fever up to 102.9, fatigue. She has not taken a covid test. Notes transportation issues with going to get a test. Feels like symptoms are worsening despite using her inhalers (advair and albuterol). She has a history of allergies and asthma.    Problems:  Patient Active Problem List   Diagnosis Date Noted   Amenorrhea 09/05/2022   Possible pregnancy 09/05/2022   Symptomatic cholelithiasis 02/16/2022   Peritoneal adhesions without obstruction 02/16/2022   Hematemesis 01/14/2022   Encounter for supervision of other normal pregnancy, first trimester 11/12/2021   Morbid obesity (HCC) 09/30/2021   Acquired hypothyroidism 06/25/2021   Migraine without aura and without status migrainosus, not intractable 03/05/2021   Mild intermittent asthma without complication 12/01/2020   Gastroesophageal reflux disease 12/01/2020   Hypoglycemia 12/01/2020   Rheumatoid factor positive 12/09/2019   SS-A antibody positive 10/28/2019   Chronic pain of both knees 10/28/2019   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   Schizoaffective disorder, depressive type (HCC) 02/02/2018   Suicide attempt (  HCC) 01/16/2018   Dysfunctional uterine bleeding 01/06/2018   Borderline personality disorder (HCC) 01/04/2018   PTSD (post-traumatic stress disorder) 01/03/2018   ASCUS of cervix with negative high risk HPV 08/28/2017   Malingering 05/06/2016    Allergies:  Allergies   Allergen Reactions   Betadine [Povidone Iodine] Other (See Comments)    Reaction:  Unknown    Fish Allergy Hives   Shellfish-Derived Products Other (See Comments)    Reaction:  Unknown    Medications:  Current Outpatient Medications:    albuterol (VENTOLIN HFA) 108 (90 Base) MCG/ACT inhaler, 2 puffs four times a day as needed, Disp: 18 g, Rfl: 1   ARIPiprazole (ABILIFY) 5 MG tablet, Take 5 mg by mouth daily., Disp: , Rfl:    azithromycin (ZITHROMAX) 250 MG tablet, Take 2 tablets on day 1, then 1 tablet daily on days 2 through 5, Disp: 6 tablet, Rfl: 0   benzonatate (TESSALON PERLES) 100 MG capsule, Take 1 capsule (100 mg total) by mouth 3 (three) times daily as needed for cough., Disp: 30 capsule, Rfl: 0   brompheniramine-pseudoephedrine-DM 30-2-10 MG/5ML syrup, Take 10 mLs by mouth 4 (four) times daily as needed., Disp: 200 mL, Rfl: 0   cabergoline (DOSTINEX) 0.5 MG tablet, Take 1 tablet (0.5 mg total) by mouth once a week., Disp: 12 tablet, Rfl: 3   cetirizine (ZYRTEC) 10 MG tablet, Take 4 tablets (40 mg total) by mouth daily for 5 days. (Patient not taking: Reported on 09/28/2022), Disp: 20 tablet, Rfl: 0   COVID-19 At Home Antigen Test KIT, Use as directed., Disp: 1 each, Rfl: 0   escitalopram (LEXAPRO) 10 MG tablet, Take 10 mg by mouth daily., Disp: , Rfl:    famotidine (PEPCID) 20 MG tablet, Take 1 tablet (20 mg total) by mouth daily for 5 days. (Patient not taking: Reported on 09/28/2022), Disp: 5 tablet, Rfl: 0   fluticasone-salmeterol (ADVAIR HFA) 45-21 MCG/ACT inhaler, Inhale 2 puffs into the lungs 2 (two) times daily., Disp: 1 each, Rfl: 12   hydrOXYzine (VISTARIL) 50 MG capsule, Take 50 mg by mouth 2 (two) times daily as needed., Disp: , Rfl:    levothyroxine (SYNTHROID) 75 MCG tablet, Take 1 tablet (75 mcg total) by mouth daily before breakfast., Disp: 90 tablet, Rfl: 3   lithium carbonate 300 MG capsule, Take 1 capsule (300 mg total) by mouth 2 (two) times daily with a meal.,  Disp: 60 capsule, Rfl: 1   magnesium oxide (MAG-OX) 400 MG tablet, Take 400 mg by mouth daily., Disp: , Rfl:    meloxicam (MOBIC) 15 MG tablet, Take 1 tablet (15 mg total) by mouth daily., Disp: 30 tablet, Rfl: 11   omeprazole (PRILOSEC) 20 MG capsule, TAKE 1 CAPSULE (20 MG TOTAL) BY MOUTH DAILY., Disp: 90 capsule, Rfl: 2   ondansetron (ZOFRAN) 4 MG tablet, Take 1 tablet (4 mg total) by mouth daily as needed for nausea or vomiting., Disp: 30 tablet, Rfl: 1   ondansetron (ZOFRAN-ODT) 4 MG disintegrating tablet, Take 1 tablet (4 mg total) by mouth every 8 (eight) hours as needed for nausea or vomiting., Disp: 20 tablet, Rfl: 0   prazosin (MINIPRESS) 2 MG capsule, Take 1 capsule (2 mg total) by mouth at bedtime., Disp: 30 capsule, Rfl: 0   predniSONE (DELTASONE) 20 MG tablet, Take 1 tablet (20 mg total) by mouth daily with breakfast for 5 days., Disp: 5 tablet, Rfl: 0   pregabalin (LYRICA) 50 MG capsule, Take 50 mg by mouth 2 (two) times  daily., Disp: , Rfl:    silver sulfADIAZINE (SILVADENE) 1 % cream, Apply 1 Application topically daily., Disp: 25 g, Rfl: 0   sucralfate (CARAFATE) 1 g tablet, Take 1 tablet (1 g total) by mouth 4 (four) times daily., Disp: 60 tablet, Rfl: 0   SUMAtriptan (IMITREX) 20 MG/ACT nasal spray, Place 20 mg into the nose as needed., Disp: , Rfl:    topiramate (TOPAMAX) 25 MG tablet, Take 25 mg by mouth 2 (two) times daily., Disp: , Rfl:    triamcinolone cream (KENALOG) 0.1 %, Apply 1 Application topically 4 (four) times daily., Disp: 30 g, Rfl: 0  Observations/Objective: Patient is well-developed, well-nourished in no acute distress.  Resting comfortably at home.  Head is normocephalic, atraumatic.  No labored breathing.  Speech is clear and coherent with logical content.  Patient is alert and oriented at baseline.    Assessment and Plan: 1. Bronchitis - predniSONE (DELTASONE) 20 MG tablet; Take 1 tablet (20 mg total) by mouth daily with breakfast for 5 days.   Dispense: 5 tablet; Refill: 0 - azithromycin (ZITHROMAX) 250 MG tablet; Take 2 tablets on day 1, then 1 tablet daily on days 2 through 5  Dispense: 6 tablet; Refill: 0 - brompheniramine-pseudoephedrine-DM 30-2-10 MG/5ML syrup; Take 10 mLs by mouth 4 (four) times daily as needed.  Dispense: 200 mL; Refill: 0  2. URI with cough and congestion - COVID-19 At Home Antigen Test KIT; Use as directed.  Dispense: 1 each; Refill: 0 - predniSONE (DELTASONE) 20 MG tablet; Take 1 tablet (20 mg total) by mouth daily with breakfast for 5 days.  Dispense: 5 tablet; Refill: 0 - azithromycin (ZITHROMAX) 250 MG tablet; Take 2 tablets on day 1, then 1 tablet daily on days 2 through 5  Dispense: 6 tablet; Refill: 0 - brompheniramine-pseudoephedrine-DM 30-2-10 MG/5ML syrup; Take 10 mLs by mouth 4 (four) times daily as needed.  Dispense: 200 mL; Refill: 0  INSTRUCTIONS: use a humidifier for nasal congestion Drink plenty of fluids, rest and wash hands frequently to avoid the spread of infection Alternate tylenol and Motrin for relief of fever   Follow Up Instructions: I discussed the assessment and treatment plan with the patient. The patient was provided an opportunity to ask questions and all were answered. The patient agreed with the plan and demonstrated an understanding of the instructions.  A copy of instructions were sent to the patient via MyChart unless otherwise noted below.    The patient was advised to call back or seek an in-person evaluation if the symptoms worsen or if the condition fails to improve as anticipated.  Time:  I spent 12 minutes with the patient via telehealth technology discussing the above problems/concerns.    Melanie Rigg, NP

## 2022-10-23 NOTE — Patient Instructions (Signed)
[name], thank you for joining Claiborne Rigg, NP for today's virtual visit.  While this provider is not your primary care provider (PCP), if your PCP is located in our provider database this encounter information will be shared with them immediately following your visit.   A [place] MyChart account gives you access to today's visit and all your visits, tests, and labs performed at [facility] " click here if you don't have a [place] MyChart account or go to [email or web address]  Consent: (Patient) Melanie Bell provided verbal consent for this virtual visit at the beginning of the encounter.  Current Medications:  Current Outpatient Medications:    albuterol (VENTOLIN HFA) 108 (90 Base) MCG/ACT inhaler, 2 puffs four times a day as needed, Disp: 18 g, Rfl: 1   ARIPiprazole (ABILIFY) 5 MG tablet, Take 5 mg by mouth daily., Disp: , Rfl:    azithromycin (ZITHROMAX) 250 MG tablet, Take 2 tablets on day 1, then 1 tablet daily on days 2 through 5, Disp: 6 tablet, Rfl: 0   benzonatate (TESSALON PERLES) 100 MG capsule, Take 1 capsule (100 mg total) by mouth 3 (three) times daily as needed for cough., Disp: 30 capsule, Rfl: 0   brompheniramine-pseudoephedrine-DM 30-2-10 MG/5ML syrup, Take 10 mLs by mouth 4 (four) times daily as needed., Disp: 200 mL, Rfl: 0   cabergoline (DOSTINEX) 0.5 MG tablet, Take 1 tablet (0.5 mg total) by mouth once a week., Disp: 12 tablet, Rfl: 3   cetirizine (ZYRTEC) 10 MG tablet, Take 4 tablets (40 mg total) by mouth daily for 5 days. (Patient not taking: Reported on 09/28/2022), Disp: 20 tablet, Rfl: 0   COVID-19 At Home Antigen Test KIT, Use as directed., Disp: 1 each, Rfl: 0   escitalopram (LEXAPRO) 10 MG tablet, Take 10 mg by mouth daily., Disp: , Rfl:    famotidine (PEPCID) 20 MG tablet, Take 1 tablet (20 mg total) by mouth daily for 5 days. (Patient not taking: Reported on 09/28/2022), Disp: 5 tablet, Rfl: 0    fluticasone-salmeterol (ADVAIR HFA) 45-21 MCG/ACT inhaler, Inhale 2 puffs into the lungs 2 (two) times daily., Disp: 1 each, Rfl: 12   hydrOXYzine (VISTARIL) 50 MG capsule, Take 50 mg by mouth 2 (two) times daily as needed., Disp: , Rfl:    levothyroxine (SYNTHROID) 75 MCG tablet, Take 1 tablet (75 mcg total) by mouth daily before breakfast., Disp: 90 tablet, Rfl: 3   lithium carbonate 300 MG capsule, Take 1 capsule (300 mg total) by mouth 2 (two) times daily with a meal., Disp: 60 capsule, Rfl: 1   magnesium oxide (MAG-OX) 400 MG tablet, Take 400 mg by mouth daily., Disp: , Rfl:    meloxicam (MOBIC) 15 MG tablet, Take 1 tablet (15 mg total) by mouth daily., Disp: 30 tablet, Rfl: 11   omeprazole (PRILOSEC) 20 MG capsule, TAKE 1 CAPSULE (20 MG TOTAL) BY MOUTH DAILY., Disp: 90 capsule, Rfl: 2   ondansetron (ZOFRAN) 4 MG tablet, Take 1 tablet (4 mg total) by mouth daily as needed for nausea or vomiting., Disp: 30 tablet, Rfl: 1   ondansetron (ZOFRAN-ODT) 4 MG disintegrating tablet, Take 1 tablet (4 mg total) by mouth every 8 (eight) hours as needed for nausea or vomiting., Disp: 20 tablet, Rfl: 0   prazosin (MINIPRESS) 2 MG capsule, Take 1 capsule (2 mg total) by mouth at bedtime., Disp: 30 capsule, Rfl: 0   predniSONE (DELTASONE) 20 MG tablet, Take 1 tablet (20 mg total) by mouth daily  with breakfast for 5 days., Disp: 5 tablet, Rfl: 0   pregabalin (LYRICA) 50 MG capsule, Take 50 mg by mouth 2 (two) times daily., Disp: , Rfl:    silver sulfADIAZINE (SILVADENE) 1 % cream, Apply 1 Application topically daily., Disp: 25 g, Rfl: 0   sucralfate (CARAFATE) 1 g tablet, Take 1 tablet (1 g total) by mouth 4 (four) times daily., Disp: 60 tablet, Rfl: 0   SUMAtriptan (IMITREX) 20 MG/ACT nasal spray, Place 20 mg into the nose as needed., Disp: , Rfl:    topiramate (TOPAMAX) 25 MG tablet, Take 25 mg by mouth 2 (two) times daily., Disp: , Rfl:    triamcinolone cream (KENALOG) 0.1 %, Apply 1 Application topically 4  (four) times daily., Disp: 30 g, Rfl: 0   Medications ordered in this encounter:  Meds ordered this encounter  Medications   COVID-19 At Home Antigen Test KIT    Sig: Use as directed.    Dispense:  1 each    Refill:  0    Order Specific Question:   Supervising Provider    Answer:   Loreli Dollar   predniSONE (DELTASONE) 20 MG tablet    Sig: Take 1 tablet (20 mg total) by mouth daily with breakfast for 5 days.    Dispense:  5 tablet    Refill:  0    Order Specific Question:   Supervising Provider    Answer:   Merrilee Jansky [[id number]]   azithromycin (ZITHROMAX) 250 MG tablet    Sig: Take 2 tablets on day 1, then 1 tablet daily on days 2 through 5    Dispense:  6 tablet    Refill:  0    Order Specific Question:   Supervising Provider    Answer:   Merrilee Jansky [[id number]]   brompheniramine-pseudoephedrine-DM 30-2-10 MG/5ML syrup    Sig: Take 10 mLs by mouth 4 (four) times daily as needed.    Dispense:  200 mL    Refill:  0    Order Specific Question:   Supervising Provider    Answer:   Merrilee Jansky [[id number]]     *If you need refills on other medications prior to your next appointment, please contact your pharmacy*  Follow-Up: Call back or seek an in-person evaluation if the symptoms worsen or if the condition fails to improve as anticipated.  [facility] [phone]  Other Instructions INSTRUCTIONS: use a humidifier for nasal congestion Drink plenty of fluids, rest and wash hands frequently to avoid the spread of infection Alternate tylenol and Motrin for relief of fever    If you have been instructed to have an in-person evaluation today at a local Urgent Care facility, please use the link below. It will take you to a list of all of our available [facility], including address, phone number and hours of operation. Please do not delay care.  [facility]  If you or a family member do not have a primary care  provider, use the link below to schedule a visit and establish care. When you choose a [place] primary care physician or advanced practice provider, you gain a long-term partner in health. Find a Primary Care Provider  Learn more about [facility] in-office and virtual care options: [facility] - Get Care Now

## 2022-10-24 ENCOUNTER — Emergency Department
Admission: EM | Admit: 2022-10-24 | Discharge: 2022-10-24 | Disposition: A | Discharge status: Home / Self Care | Payer: Medicaid Other | Attending: Emergency Medicine | Admitting: Emergency Medicine

## 2022-10-24 ENCOUNTER — Encounter: Payer: Self-pay | Admitting: Emergency Medicine

## 2022-10-24 ENCOUNTER — Ambulatory Visit: Payer: Self-pay | Admitting: *Deleted

## 2022-10-24 ENCOUNTER — Other Ambulatory Visit: Payer: Self-pay

## 2022-10-24 DIAGNOSIS — R350 Frequency of micturition: Secondary | ICD-10-CM | POA: Diagnosis present

## 2022-10-24 DIAGNOSIS — N390 Urinary tract infection, site not specified: Secondary | ICD-10-CM | POA: Insufficient documentation

## 2022-10-24 DIAGNOSIS — B9689 Other specified bacterial agents as the cause of diseases classified elsewhere: Secondary | ICD-10-CM | POA: Insufficient documentation

## 2022-10-24 DIAGNOSIS — Z20822 Contact with and (suspected) exposure to covid-19: Secondary | ICD-10-CM | POA: Insufficient documentation

## 2022-10-24 LAB — COMPREHENSIVE METABOLIC PANEL
ALT: 10 U/L (ref 0–44)
AST: 15 U/L (ref 15–41)
Albumin: 3.7 g/dL (ref 3.5–5.0)
Alkaline Phosphatase: 61 U/L (ref 38–126)
Anion gap: 7 (ref 5–15)
BUN: 12 mg/dL (ref 6–20)
CO2: 25 mmol/L (ref 22–32)
Calcium: 9 mg/dL (ref 8.9–10.3)
Chloride: 106 mmol/L (ref 98–111)
Creatinine, Ser: 0.69 mg/dL (ref 0.44–1.00)
GFR, Estimated: >60 mL/min (ref >60)
Glucose, Bld: 104 mg/dL — ABNORMAL HIGH (ref 70–99)
Potassium: 3.8 mmol/L (ref 3.5–5.1)
Sodium: 138 mmol/L (ref 135–145)
Total Bilirubin: 0.6 mg/dL (ref 0.3–1.2)
Total Protein: 6.5 g/dL (ref 6.5–8.1)

## 2022-10-24 LAB — URINALYSIS, ROUTINE W REFLEX MICROSCOPIC
RBC / HPF: >50 RBC/hpf (ref 0–5)
Specific Gravity, Urine: 1.015 (ref 1.005–1.030)
WBC, UA: >50 WBC/hpf (ref 0–5)

## 2022-10-24 LAB — RESP PANEL BY RT-PCR (RSV, FLU A&B, COVID)  RVPGX2
Influenza A by PCR: NEGATIVE
Influenza B by PCR: NEGATIVE
Resp Syncytial Virus by PCR: NEGATIVE
SARS Coronavirus 2 by RT PCR: NEGATIVE

## 2022-10-24 LAB — CBC
HCT: 39.9 % (ref 36.0–46.0)
Hemoglobin: 13 g/dL (ref 12.0–15.0)
MCH: 30.3 pg (ref 26.0–34.0)
MCHC: 32.6 g/dL (ref 30.0–36.0)
MCV: 93 fL (ref 80.0–100.0)
Platelets: 197 10*3/uL (ref 150–400)
RBC: 4.29 MIL/uL (ref 3.87–5.11)
RDW: 12.9 % (ref 11.5–15.5)
WBC: 7.3 10*3/uL (ref 4.0–10.5)
nRBC: 0 % (ref 0.0–0.2)

## 2022-10-24 LAB — POC URINE PREG, ED: Preg Test, Ur: NEGATIVE

## 2022-10-24 LAB — GROUP A STREP BY PCR: Group A Strep by PCR: NOT DETECTED

## 2022-10-24 LAB — LIPASE, BLOOD: Lipase: 36 U/L (ref 11–51)

## 2022-10-24 MED ORDER — SODIUM CHLORIDE 0.9 % IV SOLN
1.0000 g | Freq: Once | INTRAVENOUS | Status: AC
  Administered 2022-10-24: 1 g via INTRAVENOUS
  Filled 2022-10-24: qty 10

## 2022-10-24 MED ORDER — CEFDINIR 300 MG PO CAPS: 300.0000 mg | ORAL_CAPSULE | Freq: Two times a day (BID) | ORAL | 0 refills | Status: DC

## 2022-10-24 MED ORDER — SODIUM CHLORIDE 0.9 % IV BOLUS
1000.0000 mL | Freq: Once | INTRAVENOUS | Status: AC
  Administered 2022-10-24: 1000 mL via INTRAVENOUS

## 2022-10-24 MED ORDER — ONDANSETRON 4 MG PO TBDP: 4.0000 mg | ORAL_TABLET | Freq: Three times a day (TID) | ORAL | 0 refills | Status: DC | PRN

## 2022-10-24 MED ORDER — ONDANSETRON HCL 4 MG/2ML IJ SOLN
4.0000 mg | Freq: Once | INTRAMUSCULAR | Status: AC
  Administered 2022-10-24: 4 mg via INTRAVENOUS
  Filled 2022-10-24: qty 2

## 2022-10-24 NOTE — ED Provider Notes (Signed)
[facility] Provider Note    Event Date/Time   First MD Initiated Contact with Patient 10/24/22 [phone]     (approximate)   History   Generalized Body Aches   HPI  Melanie Bell is a 32 y.o. female presents to the ED with complaint of fever of 102.9 last evening and complaint of sore throat for the last 5 days.  Patient states that this morning she had some vomiting and continues to have nausea now.  Patient reports urinary frequency and dysuria also.  She denies any respiratory symptoms or diarrhea.  No known sick contacts.  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     Physical Exam   Triage Vital Signs: ED Triage Vitals  Enc Vitals Group     BP 10/24/22 0909 127/83     Pulse Rate 10/24/22 0909 66     Resp 10/24/22 0909 15     Temp 10/24/22 0909 98.7 F (37.1 C)     Temp Source 10/24/22 0909 Oral     SpO2 10/24/22 0909 100 %     Weight 10/24/22 0907 280 lb 13.9 oz (127.4 kg)     Height 10/24/22 0907 5\' 7"  (1.702 m)     Head Circumference --      Peak Flow --      Pain Score 10/24/22 0907 9     Pain Loc --      Pain Edu? --      Excl. in GC? --     Most recent vital signs: Vitals:   10/24/22 1256 10/24/22 1327  BP: 120/80 122/78  Pulse: 70 70  Resp: 16 16  Temp:  98 F (36.7 C)  SpO2: 100% 100%     General: Awake, no distress.  Tearful. CV:  Good peripheral perfusion.  Heart regular rate and rhythm. Resp:  Normal effort.  Clear bilaterally. Abd:  No distention.  No flank tenderness.  Bowel sounds present x 4 quadrants. Other:     ED Results / Procedures / Treatments   Labs (all labs ordered are listed, but only abnormal results are displayed) Labs Reviewed  COMPREHENSIVE METABOLIC PANEL - Abnormal; Notable for the following components:      Result Value   Glucose, Bld 104 (*)    All other components within normal  limits  URINALYSIS, ROUTINE W REFLEX MICROSCOPIC - Abnormal; Notable for the following components:   Color, Urine RED (*)    APPearance CLOUDY (*)    Glucose, UA   (*)    Value: TEST NOT REPORTED DUE TO COLOR INTERFERENCE OF URINE PIGMENT   Hgb urine dipstick   (*)    Value: TEST NOT REPORTED DUE TO COLOR INTERFERENCE OF URINE PIGMENT   Bilirubin Urine   (*)    Value: TEST NOT REPORTED DUE TO COLOR INTERFERENCE OF URINE PIGMENT   Ketones, ur   (*)    Value: TEST NOT REPORTED DUE TO COLOR INTERFERENCE OF URINE PIGMENT   Protein, ur   (*)    Value: TEST NOT REPORTED DUE TO COLOR INTERFERENCE OF URINE PIGMENT   Nitrite   (*)    Value: TEST NOT REPORTED DUE TO COLOR INTERFERENCE OF URINE PIGMENT   Leukocytes,Ua   (*)    Value: TEST NOT REPORTED DUE TO COLOR INTERFERENCE OF URINE PIGMENT   Bacteria, UA MANY (*)    All other components within normal limits  GROUP  A STREP BY PCR  RESP PANEL BY RT-PCR (RSV, FLU A&B, COVID)  [id number]  URINE CULTURE  LIPASE, BLOOD  CBC  POC URINE PREG, ED      PROCEDURES:  Critical Care performed:   Procedures   MEDICATIONS ORDERED IN ED: Medications  sodium chloride 0.9 % bolus 1,000 mL (0 mLs Intravenous Stopped 10/24/22 1326)  ondansetron (ZOFRAN) injection 4 mg (4 mg Intravenous Given 10/24/22 1157)  cefTRIAXone (ROCEPHIN) 1 g in sodium chloride 0.9 % 100 mL IVPB (0 g Intravenous Stopped 10/24/22 1326)     IMPRESSION / MDM / ASSESSMENT AND PLAN / ED COURSE  I reviewed the triage vital signs and the nursing notes.   Differential diagnosis includes, but is not limited to, strep pharyngitis, COVID, influenza, upper respiratory infection, urinary tract infection, vaginitis  32 year old female presents to the ED with multiple complaints including sore throat and urinary symptoms.  Patient was reassured with respiratory panel being negative and strep was also not detected.  Urinalysis showed greater than 50 RBCs and WBCs with many bacteria.   Pregnancy test was negative.  CBC and lipase were unremarkable.  Patient was given Zofran 4 mg IV and Rocephin 1 g while in the emergency department.  A prescription for cefdinir and Zofran was sent to the pharmacy for her to begin taking.  Patient is encouraged to drink fluids frequently to stay hydrated.  Also Tylenol or ibuprofen if needed for body aches.  She is reassured that her respiratory panel and strep are negative.  Lipase, CBC and CMP all were within normal limits.  She is to follow-up with her PCP if any continued problems.  She is return to the emergency department if any severe worsening of her symptoms.      Patient's presentation is most consistent with acute illness / injury with system symptoms.  FINAL CLINICAL IMPRESSION(S) / ED DIAGNOSES   Final diagnoses:  Acute urinary tract infection     Rx / DC Orders   ED Discharge Orders          Ordered    cefdinir (OMNICEF) 300 MG capsule  2 times daily        10/24/22 1303    ondansetron (ZOFRAN-ODT) 4 MG disintegrating tablet  Every 8 hours PRN        10/24/22 1303             Note:  This document was prepared using Dragon voice recognition software and may include unintentional dictation errors.   Tommi Rumps, PA-C 10/24/22 1455    Minna Antis, MD 10/25/22 1424

## 2022-10-24 NOTE — ED Triage Notes (Signed)
Arrives from home via [facility]>  C/O sore throat x 5 days. And N/V x 3 days.  Seen by her PCP, who recommended pt to come to ED for IVF.  146/95 99% RA 67

## 2022-10-24 NOTE — ED Triage Notes (Signed)
States was running fever last night 102.9 - tylenol taken at 2030 and fever broke  AAOx3.  Skin warm and dry.  Managing secretions.  Voice soft.  NAD

## 2022-10-24 NOTE — Telephone Encounter (Signed)
  Chief Complaint: vomiting, wheezing Symptoms: vomiting, wheezing, cough,possible fever Frequency: 2 days  Disposition: [x] ED /[] Urgent Care (no appt availability in office) / [] Appointment(In office/virtual)/ []  [facility]/ [] Home Care/ [] Refused Recommended Disposition /[] [facility]/ []  Follow-up with PCP Additional Notes: Patient has virtual visit yesterday- but has not had medications that were prescribed delivered yet. Patient states she is vomiting and can not keep anything down- also has abdominal pain. Advised ED

## 2022-10-24 NOTE — Telephone Encounter (Signed)
Reason for Disposition  [1] SEVERE vomiting (e.g., 6 or more times/day) AND [2] present > 8 hours (Exception: Patient sounds well, is drinking liquids, does not sound dehydrated, and vomiting has lasted less than 24 hours.)  Answer Assessment - Initial Assessment Questions 1. RESPIRATORY STATUS: "Describe your breathing?" (e.g., wheezing, shortness of breath, unable to speak, severe coughing)      Wheezing, vomiting 2. ONSET: "When did this breathing problem begin?"      4-5 days ago- progressively worse 3. PATTERN "Does the difficult breathing come and go, or has it been constant since it started?"      constant 4. SEVERITY: "How bad is your breathing?" (e.g., mild, moderate, severe)    - MILD: No SOB at rest, mild SOB with walking, speaks normally in sentences, can lie down, no retractions, pulse < 100.    - MODERATE: SOB at rest, SOB with minimal exertion and prefers to sit, cannot lie down flat, speaks in phrases, mild retractions, audible wheezing, pulse 100-120.    - SEVERE: Very SOB at rest, speaks in single words, struggling to breathe, sitting hunched forward, retractions, pulse > 120      Coughing makes patient gasp for air 5. RECURRENT SYMPTOM: "Have you had difficulty breathing before?" If Yes, ask: "When was the last time?" and "What happened that time?"      *No Answer* 6. CARDIAC HISTORY: "Do you have any history of heart disease?" (e.g., heart attack, angina, bypass surgery, angioplasty)      *No Answer* 7. LUNG HISTORY: "Do you have any history of lung disease?"  (e.g., pulmonary embolus, asthma, emphysema)     *No Answer* 8. CAUSE: "What do you think is causing the breathing problem?"      *No Answer* 9. OTHER SYMPTOMS: "Do you have any other symptoms? (e.g., dizziness, runny nose, cough, chest pain, fever)     *No Answer* 10. O2 SATURATION MONITOR:  "Do you use an oxygen saturation monitor (pulse oximeter) at home?" If Yes, ask: "What is your reading (oxygen level) today?"  "What is your usual oxygen saturation reading?" (e.g., 95%)       *No Answer* 11. PREGNANCY: "Is there any chance you are pregnant?" "When was your last menstrual period?"       *No Answer* 12. TRAVEL: "Have you traveled out of the country in the last month?" (e.g., travel history, exposures)       *No Answer*  Answer Assessment - Initial Assessment Questions 1. VOMITING SEVERITY: "How many times have you vomited in the past 24 hours?"     - MILD:  1 - 2 times/day    - MODERATE: 3 - 5 times/day, decreased oral intake without significant weight loss or symptoms of dehydration    - SEVERE: 6 or more times/day, vomits everything or nearly everything, with significant weight loss, symptoms of dehydration      Several-"a lot" 2. ONSET: "When did the vomiting begin?"      3 days ago 3. FLUIDS: "What fluids or food have you vomited up today?" "Have you been able to keep any fluids down?"     Unable to keep fluid down 4. ABDOMEN PAIN: "Are your having any abdomen pain?" If Yes : "How bad is it and what does it feel like?" (e.g., crampy, dull, intermittent, constant)      Yes-constant-  5. DIARRHEA: "Is there any diarrhea?" If Yes, ask: "How many times today?"      no 6. CONTACTS: "Is there anyone else  in the family with the same symptoms?"      no 7. CAUSE: "What do you think is causing your vomiting?"     Unsure- respiratory symptoms 8. HYDRATION STATUS: "Any signs of dehydration?" (e.g., dry mouth [not only dry lips], too weak to stand) "When did you last urinate?"     Dry mouth, weakness, last urinated-am- small amount, dark 9. OTHER SYMPTOMS: "Do you have any other symptoms?" (e.g., fever, headache, vertigo, vomiting blood or coffee grounds, recent head injury)     Wheezing, fever  Protocols used: Breathing Difficulty-A-AH, Vomiting-A-AH

## 2022-10-24 NOTE — Discharge Instructions (Signed)
Take all the antibiotics until completely finished.  A prescription for Zofran was also sent to the pharmacy if needed for nausea or vomiting.  Tylenol or ibuprofen if needed for aches, fever.  Return to the emergency department if any severe worsening of your symptoms or inability to take the antibiotic.

## 2022-10-25 ENCOUNTER — Telehealth: Payer: Self-pay

## 2022-10-25 ENCOUNTER — Ambulatory Visit: Payer: Medicaid Other

## 2022-10-25 LAB — URINE CULTURE

## 2022-10-26 NOTE — Transitions of Care (Post Inpatient/ED Visit) (Signed)
10/26/2022  [name] MRN: [id number] DOB: 1990/06/29  Today's TOC FU Call Status: Today's TOC FU Call Status:: Successful TOC FU Call Competed TOC FU Call Complete Date: 10/26/22  Transition Care Management Follow-up Telephone Call Date of Discharge: 10/24/22 Discharge Facility: [facility] Name of Other (Non-[facility]) Discharge Facility: [facility] Type of Discharge: Emergency Department Reason for ED Visit: Other: (UTI) How have you been since you were released from the hospital?: Better Any questions or concerns?: No  Items Reviewed: Did you receive and understand the discharge instructions provided?: Yes Medications obtained,verified, and reconciled?: Yes (Medications Reviewed) Any new allergies since your discharge?: No Dietary orders reviewed?: No Do you have support at home?: No People in Home: spouse Name of Support/Comfort Primary Source: Weston Brass  Medications Reviewed Today: Medications Reviewed Today     Reviewed by Mariel Sleet, CMA (Certified Medical Assistant) on 10/26/22 at 1011  Med List Status: <None>   Medication Order Taking? Sig Documenting Provider Last Dose Status Informant  albuterol (VENTOLIN HFA) 108 (90 Base) MCG/ACT inhaler [id number]  2 puffs four times a day as needed [name], MD  Active Self  ARIPiprazole (ABILIFY) 5 MG tablet [id number]  Take 5 mg by mouth daily. [provider]  Active Self  cabergoline (DOSTINEX) 0.5 MG tablet [id number]  Take 1 tablet (0.5 mg total) by mouth once a week. Shamleffer, Konrad Dolores, MD  Active   cefdinir (OMNICEF) 300 MG capsule [id number]  Take 1 capsule (300 mg total) by mouth 2 (two) times daily. Tommi Rumps, PA-C  Active   cetirizine (ZYRTEC) 10 MG tablet [id number]  Take 4 tablets (40 mg total) by mouth daily for 5 days.  Patient not taking: Reported on 09/28/2022   Racheal Patches, PA-C  Expired 07/30/22 2359   COVID-19 At Home Antigen Test  KIT [id number]  Use as directed. Claiborne Rigg, NP  Active   escitalopram (LEXAPRO) 10 MG tablet [id number]  Take 10 mg by mouth daily. [provider]  Active Self  fluticasone-salmeterol (ADVAIR HFA) 45-21 MCG/ACT inhaler [id number]  Inhale 2 puffs into the lungs 2 (two) times daily. [name], MD  Active Self  hydrOXYzine (VISTARIL) 50 MG capsule [id number]  Take 50 mg by mouth 2 (two) times daily as needed. [provider]  Active Self  levothyroxine (SYNTHROID) 75 MCG tablet [id number]  Take 1 tablet (75 mcg total) by mouth daily before breakfast. Shamleffer, Konrad Dolores, MD  Active   lithium carbonate 300 MG capsule [id number]  Take 1 capsule (300 mg total) by mouth 2 (two) times daily with a meal. [name], [name], MD  Active Self  magnesium oxide (MAG-OX) 400 MG tablet [id number]  Take 400 mg by mouth daily. [provider]  Active Self  meloxicam (MOBIC) 15 MG tablet [id number]  Take 1 tablet (15 mg total) by mouth daily. Varney Daily, PA  Active   omeprazole (PRILOSEC) 20 MG capsule [id number]  TAKE 1 CAPSULE (20 MG TOTAL) BY MOUTH DAILY. [name], MD  Active   ondansetron (ZOFRAN-ODT) 4 MG disintegrating tablet [id number]  Take 1 tablet (4 mg total) by mouth every 8 (eight) hours as needed for nausea or vomiting. Tommi Rumps, PA-C  Active   prazosin (MINIPRESS) 2 MG capsule [id number]  Take 1 capsule (2 mg total) by mouth at bedtime. McNew, Ileene Hutchinson, MD  Active Self  predniSONE (DELTASONE) 20 MG tablet [id number]  Take 1 tablet (20 mg  total) by mouth daily with breakfast for 5 days. Claiborne Rigg, NP  Active   pregabalin (LYRICA) 50 MG capsule [id number]  Take 50 mg by mouth 2 (two) times daily. [provider]  Active Self  silver sulfADIAZINE (SILVADENE) 1 % cream [id number]  Apply 1 Application topically daily. Radford Pax, NP  Active   sucralfate (CARAFATE) 1 g tablet [id number]  Take 1 tablet (1 g total) by mouth 4  (four) times daily. Phineas Semen, MD  Active   SUMAtriptan [facility]) 20 MG/ACT nasal spray [id number]  Place 20 mg into the nose as needed. [provider]  Active Self  topiramate (TOPAMAX) 25 MG tablet [id number]  Take 25 mg by mouth 2 (two) times daily. [provider]  Active Self  triamcinolone cream (KENALOG) 0.1 % [id number]  Apply 1 Application topically 4 (four) times daily. Cuthriell, Delorise Royals, PA-C  Active             Home Care and Equipment/Supplies: Were Home Health Services Ordered?: No Any new equipment or medical supplies ordered?: No  Functional Questionnaire: Do you need assistance with bathing/showering or dressing?: No Do you need assistance with meal preparation?: No Do you need assistance with eating?: No Do you have difficulty maintaining continence: No Do you need assistance with getting out of bed/getting out of a chair/moving?: No Do you have difficulty managing or taking your medications?: No  Follow up appointments reviewed: PCP Follow-up appointment confirmed?: NA Specialist Hospital Follow-up appointment confirmed?: No Do you need transportation to your follow-up appointment?: No Do you understand care options if your condition(s) worsen?: Yes-patient verbalized understanding    Sammye Staff [facility]  Primary Care & Sports Medicine at [facility] CMA, AAMA [address]  [place] [address] Office [phone]  Fax: [phone]

## 2022-10-27 ENCOUNTER — Other Ambulatory Visit: Payer: Medicaid Other | Admitting: Obstetrics and Gynecology

## 2022-10-27 ENCOUNTER — Encounter: Payer: Self-pay | Admitting: Obstetrics and Gynecology

## 2022-10-27 NOTE — Patient Outreach (Signed)
Medicaid Managed Care   Nurse Care Manager Note  10/27/2022 Name:  Melanie Bell MRN:  [id number] DOB:  06-Aug-1990  Melanie Bell is an 32 y.o. year old female who is a primary patient of Melanie Bell, Melanie Cowden, MD.  The [facility] Managed Care Coordination team was consulted for assistance with:    Chronic healthcare management needs, asthma, osteoarthritis, migraines, GERD, PTSD, borderline personality disorder, schizoaffective disorder, chronic pain, hypothyroid, seizures  Melanie Bell was given information about Medicaid Managed Care Coordination team services today. Melanie Bell Patient agreed to services and verbal consent obtained.  Engaged with patient by telephone for follow up visit in response to provider referral for case management and/or care coordination services.   Assessments/Interventions:  Review of past medical history, allergies, medications, health status, including review of consultants reports, laboratory and other test data, was performed as part of comprehensive evaluation and provision of chronic care management services.  SDOH (Social Determinants of Health) assessments and interventions performed: SDOH Interventions    Flowsheet Row Patient Outreach Telephone from 10/27/2022 in [place] POPULATION HEALTH DEPARTMENT Patient Outreach Telephone from 09/21/2022 in [place] POPULATION HEALTH DEPARTMENT Patient Outreach Telephone from 08/22/2022 in [place] POPULATION HEALTH DEPARTMENT Office Visit from 06/20/2022 in [facility] Primary Care & Sports Medicine at [facility] Clinical Support from 01/05/2022 in [facility] Primary Care & Sports Medicine at [facility] Clinical Support from 01/04/2021 in [facility] Primary Care & Sports Medicine at [facility]  SDOH Interventions        Food Insecurity Interventions Intervention Not Indicated -- -- -- -- Intervention Not Indicated  Housing Interventions Intervention Not Indicated -- -- -- Intervention  Not Indicated Intervention Not Indicated  Transportation Interventions -- Intervention Not Indicated  [[name]] -- -- Intervention Not Indicated Intervention Not Indicated  Alcohol Usage Interventions -- -- Intervention Not Indicated (Score <7) -- -- --  Depression Interventions/Treatment  -- -- -- Counseling Counseling --  Financial Strain Interventions -- -- -- -- -- Intervention Not Indicated  Physical Activity Interventions -- -- -- -- Intervention Not Indicated Intervention Not Indicated  Stress Interventions -- -- Intervention Not Indicated -- Intervention Not Indicated Intervention Not Indicated  Social Connections Interventions -- -- -- -- Intervention Not Indicated Intervention Not Indicated     Care Plan  Allergies  Allergen Reactions   Betadine [Povidone Iodine] Other (See Comments)    Reaction:  Unknown    Fish Allergy Hives   Shellfish-Derived Products Other (See Comments)    Reaction:  Unknown     Medications Reviewed Today     Reviewed by Danie Chandler, RN (Registered Nurse) on 10/27/22 at [phone]  Med List Status: <None>   Medication Order Taking? Sig Documenting Provider Last Dose Status Informant  albuterol (VENTOLIN HFA) 108 (90 Base) MCG/ACT inhaler [id number] No 2 puffs four times a day as needed [name], MD Taking Active Self  ARIPiprazole (ABILIFY) 5 MG tablet [id number] No Take 5 mg by mouth daily. [provider] Taking Active Self  cabergoline (DOSTINEX) 0.5 MG tablet [id number] No Take 1 tablet (0.5 mg total) by mouth once a week. Shamleffer, Konrad Dolores, MD Taking Active   cefdinir (OMNICEF) 300 MG capsule [id number] No Take 1 capsule (300 mg total) by mouth 2 (two) times daily. Tommi Rumps, PA-C Taking Active   cetirizine (ZYRTEC) 10 MG tablet [id number] No Take 4 tablets (40 mg total) by mouth daily for 5 days.  Patient not taking: Reported on  09/28/2022   Cuthriell, Delorise Royals, PA-C Not Taking Expired 07/30/22 2359   COVID-19 At  Home Antigen Test KIT [id number] No Use as directed. Claiborne Rigg, NP Taking Active   escitalopram (LEXAPRO) 10 MG tablet [id number] No Take 10 mg by mouth daily. [provider] Taking Active Self  fluticasone-salmeterol (ADVAIR HFA) 45-21 MCG/ACT inhaler [id number] No Inhale 2 puffs into the lungs 2 (two) times daily. [name], MD Taking Active Self  hydrOXYzine (VISTARIL) 50 MG capsule [id number] No Take 50 mg by mouth 2 (two) times daily as needed. [provider] Taking Active Self  levothyroxine (SYNTHROID) 75 MCG tablet [id number] No Take 1 tablet (75 mcg total) by mouth daily before breakfast. Shamleffer, Konrad Dolores, MD Taking Active   lithium carbonate 300 MG capsule [id number] No Take 1 capsule (300 mg total) by mouth 2 (two) times daily with a meal. [name], [name], MD Taking Active Self  magnesium oxide (MAG-OX) 400 MG tablet [id number] No Take 400 mg by mouth daily. [provider] Taking Active Self  meloxicam (MOBIC) 15 MG tablet [id number] No Take 1 tablet (15 mg total) by mouth daily. Varney Daily, PA Taking Active   omeprazole (PRILOSEC) 20 MG capsule [id number] No TAKE 1 CAPSULE (20 MG TOTAL) BY MOUTH DAILY. [name], MD Taking Active   ondansetron (ZOFRAN-ODT) 4 MG disintegrating tablet [id number] No Take 1 tablet (4 mg total) by mouth every 8 (eight) hours as needed for nausea or vomiting. Tommi Rumps, PA-C Taking Active   prazosin (MINIPRESS) 2 MG capsule [id number] No Take 1 capsule (2 mg total) by mouth at bedtime. McNew, Ileene Hutchinson, MD Taking Active Self  predniSONE (DELTASONE) 20 MG tablet [id number] No Take 1 tablet (20 mg total) by mouth daily with breakfast for 5 days. Claiborne Rigg, NP Taking Active   pregabalin (LYRICA) 50 MG capsule [id number] No Take 50 mg by mouth 2 (two) times daily. [provider] Taking Active Self  silver sulfADIAZINE (SILVADENE) 1 % cream [id number] No Apply 1 Application  topically daily. Radford Pax, NP Taking Active   sucralfate (CARAFATE) 1 g tablet [id number] No Take 1 tablet (1 g total) by mouth 4 (four) times daily. Phineas Semen, MD Taking Active   SUMAtriptan [facility]) 20 MG/ACT nasal spray [id number] No Place 20 mg into the nose as needed. [provider] Taking Active Self  topiramate (TOPAMAX) 25 MG tablet [id number] No Take 25 mg by mouth 2 (two) times daily. [provider] Taking Active Self  triamcinolone cream (KENALOG) 0.1 % [id number] No Apply 1 Application topically 4 (four) times daily. Racheal Patches, PA-C Taking Active            Patient Active Problem List   Diagnosis Date Noted   Amenorrhea 09/05/2022   Possible pregnancy 09/05/2022   Symptomatic cholelithiasis 02/16/2022   Peritoneal adhesions without obstruction 02/16/2022   Hematemesis 01/14/2022   Encounter for supervision of other normal pregnancy, first trimester 11/12/2021   Morbid obesity (HCC) 09/30/2021   Acquired hypothyroidism 06/25/2021   Migraine without aura and without status migrainosus, not intractable 03/05/2021   Mild intermittent asthma without complication 12/01/2020   Gastroesophageal reflux disease 12/01/2020   Hypoglycemia 12/01/2020   Rheumatoid factor positive 12/09/2019   SS-A antibody positive 10/28/2019   Chronic pain of both knees 10/28/2019   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   Schizoaffective disorder, depressive type (HCC) 02/02/2018   Suicide attempt (HCC) 01/16/2018   Dysfunctional uterine bleeding 01/06/2018  Borderline personality disorder (HCC) 01/04/2018   PTSD (post-traumatic stress disorder) 01/03/2018   ASCUS of cervix with negative high risk HPV 08/28/2017   Malingering 05/06/2016   Conditions to be addressed/monitored per PCP order:  Chronic healthcare management needs, asthma, osteoarthritis, migraines, GERD, PTSD, borderline personality disorder, schizoaffective disorder, chronic pain, hypothyroid,  seizures  Care Plan : RN Care Manager Plan of Care  Updates made by Danie Chandler, RN since 10/27/2022 12:00 AM     Problem: Health Promotion or Disease Self-Management (General Plan of Care)      Long-Range Goal: Chronic Disease Management   Start Date: 08/22/2022  Expected End Date: 01/27/2023  Priority: High  Note:   Current Barriers:  Knowledge Deficits related to plan of care for management of migraines, asthma, GERD, PTSD, borderline personality disorder, schizoaffective disorder, chronic pain, hypothyroid, osteoarthritis  Chronic Disease Management support and education needs related to  Knowledge Deficits related to plan of care for management of migraines, asthma, GERD, PTSD, borderline personality disorder, schizoaffective disorder, chronic pain, hypothyroid, osteoarthritis 10/27/22:  Patient recovering from UTI and URI infection, improving.  CARDS appt WNL-EKG and monitor WNL.  Continues PT twice a week for knee pain.    RNCM Clinical Goal(s):  Patient will verbalize understanding of plan for management of migraines, asthma, GERD, PTSD, borderline personality disorder, schizoaffective disorder, chronic pain, hypothyroid, osteoarthritis  verbalize basic understanding of  migraines, asthma, GERD, PTSD, borderline personality disorder, schizoaffective disorder, chronic pain, hypothyroid, osteoarthritis  take all medications exactly as prescribed and will call provider for medication related questions as evidenced by patient report demonstrate understanding of rationale for each prescribed medication as evidenced by patient report attend all scheduled medical appointments as evidenced by patient report and EMR review demonstrate ongoing  adherence to prescribed treatment plan for migraines, asthma, GERD, PTSD, borderline personality disorder, schizoaffective disorder, chronic pain, hypothyroid, osteoarthritis  continue to work with RN Care Manager to address care management and care  coordination needs related to migraines, asthma, GERD, PTSD, borderline personality disorder, schizoaffective disorder, chronic pain, hypothyroid, osteoarthritis  experience decrease in ED visits as evidenced by EMR review.  ED visits in in last 6 months =17 through collaboration with RN Care manager, provider, and care team.   Interventions: Inter-disciplinary care team collaboration (see longitudinal plan of care) Evaluation of current treatment plan related to  self management and patient's adherence to plan as established by provider  Asthma: (Status:New goal.) Long Term Goal Advised patient to track and manage Asthma triggers Advised patient to self assesses Asthma action plan zone and make appointment with provider if in the yellow zone for 48 hours without improvement Provided education about and advised patient to utilize infection prevention strategies to reduce risk of respiratory infection Discussed the importance of adequate rest and management of fatigue with Asthma Assessed social determinant of health barriers   Pain Interventions:  (Status:  New goal.) Long Term Goal Pain assessment performed Medications reviewed Reviewed provider established plan for pain management Discussed importance of adherence to all scheduled medical appointments Counseled on the importance of reporting any/all new or changed pain symptoms or management strategies to pain management provider Advised patient to report to care team affect of pain on daily activities Discussed use of relaxation techniques and/or diversional activities to assist with pain reduction (distraction, imagery, relaxation, massage, acupressure, TENS, heat, and cold application Reviewed with patient prescribed pharmacological and nonpharmacological pain relief strategies Assessed social determinant of health barriers  Patient Goals/Self-Care Activities: Take all medications as  prescribed Attend all scheduled provider  appointments Call pharmacy for medication refills 3-7 days in advance of running out of medications Perform all self care activities independently  Perform IADL's (shopping, preparing meals, housekeeping, managing finances) independently Call provider office for new concerns or questions   Follow Up Plan:  The patient has been provided with contact information for the care management team and has been advised to call with any health related questions or concerns.  The care management team will reach out to the patient again over the next 60 business  days.    Long-Range Goal: Establish Plan of Care for Chronic Disease Management Needs   Priority: High  Note:   Timeframe:  Long-Range Goal Priority:  High Start Date:  08/22/22                           Expected End Date: ongoing                      Follow Up Date 12/27/22   - practice safe sex - schedule appointment for vaccines needed due to my age or health - schedule recommended health tests (blood work, mammogram, colonoscopy, pap test) - schedule and keep appointment for annual check-up    Why is this important?   Screening tests can find diseases early when they are easier to treat.  Your doctor or nurse will talk with you about which tests are important for you.  Getting shots for common diseases like the flu and shingles will help prevent them.  10/27/22:  PT twice a week, upcoming ENDO and CARDS appt   Follow Up:  Patient agrees to Care Plan and Follow-up.  Plan: The Managed Medicaid care management team will reach out to the patient again over the next 30 business  days. and The  Patient has been provided with contact information for the Managed Medicaid care management team and has been advised to call with any health related questions or concerns.  Date/time of next scheduled RN care management/care coordination outreach: 12/27/22 at 0900

## 2022-10-27 NOTE — Patient Instructions (Signed)
Hi Ms. Orlowski, I am glad you are feeling better today-I hope you have a nice day!!  Ms. Rallo was given information about Medicaid Managed Care team care coordination services and verbally consented to engagement with the [facility] Managed Care team.   Ms. Villela - following are the goals we discussed in your visit today:   Goals Addressed    Timeframe:  Long-Range Goal Priority:  High Start Date:  08/22/22                           Expected End Date: ongoing                      Follow Up Date 12/27/22   - practice safe sex - schedule appointment for vaccines needed due to my age or health - schedule recommended health tests (blood work, mammogram, colonoscopy, pap test) - schedule and keep appointment for annual check-up    Why is this important?   Screening tests can find diseases early when they are easier to treat.  Your doctor or nurse will talk with you about which tests are important for you.  Getting shots for common diseases like the flu and shingles will help prevent them.  10/27/22:  PT twice a week, upcoming ENDO and CARDS appt  Patient verbalizes understanding of instructions and care plan provided today and agrees to view in MyChart. Active MyChart status and patient understanding of how to access instructions and care plan via MyChart confirmed with patient.     The Managed Medicaid care management team will reach out to the patient again over the next 60 business  days.  The  Patient  has been provided with contact information for the Managed Medicaid care management team and has been advised to call with any health related questions or concerns.   Following is a copy of your plan of care:  Care Plan : RN Care Manager Plan of Care  Updates made by Danie Chandler, RN since 10/27/2022 12:00 AM     Problem: Health Promotion or Disease Self-Management (General Plan of Care)      Long-Range Goal: Chronic Disease Management   Start Date: 08/22/2022  Expected End Date:  01/27/2023  Priority: High  Note:   Current Barriers:  Knowledge Deficits related to plan of care for management of migraines, asthma, GERD, PTSD, borderline personality disorder, schizoaffective disorder, chronic pain, hypothyroid, osteoarthritis  Chronic Disease Management support and education needs related to  Knowledge Deficits related to plan of care for management of migraines, asthma, GERD, PTSD, borderline personality disorder, schizoaffective disorder, chronic pain, hypothyroid, osteoarthritis 10/27/22:  Patient recovering from UTI and URI infection, improving.  CARDS appt WNL-EKG and monitor WNL.  Continues PT twice a week for knee pain.    RNCM Clinical Goal(s):  Patient will verbalize understanding of plan for management of migraines, asthma, GERD, PTSD, borderline personality disorder, schizoaffective disorder, chronic pain, hypothyroid, osteoarthritis  verbalize basic understanding of  migraines, asthma, GERD, PTSD, borderline personality disorder, schizoaffective disorder, chronic pain, hypothyroid, osteoarthritis  take all medications exactly as prescribed and will call provider for medication related questions as evidenced by patient report demonstrate understanding of rationale for each prescribed medication as evidenced by patient report attend all scheduled medical appointments as evidenced by patient report and EMR review demonstrate ongoing  adherence to prescribed treatment plan for migraines, asthma, GERD, PTSD, borderline personality disorder, schizoaffective disorder, chronic pain, hypothyroid, osteoarthritis  continue to work with RN Care Manager to address care management and care coordination needs related to migraines, asthma, GERD, PTSD, borderline personality disorder, schizoaffective disorder, chronic pain, hypothyroid, osteoarthritis  experience decrease in ED visits as evidenced by EMR review.  ED visits in in last 6 months =17 through collaboration with RN Care  manager, provider, and care team.   Interventions: Inter-disciplinary care team collaboration (see longitudinal plan of care) Evaluation of current treatment plan related to  self management and patient's adherence to plan as established by provider  Asthma: (Status:New goal.) Long Term Goal Advised patient to track and manage Asthma triggers Advised patient to self assesses Asthma action plan zone and make appointment with provider if in the yellow zone for 48 hours without improvement Provided education about and advised patient to utilize infection prevention strategies to reduce risk of respiratory infection Discussed the importance of adequate rest and management of fatigue with Asthma Assessed social determinant of health barriers   Pain Interventions:  (Status:  New goal.) Long Term Goal Pain assessment performed Medications reviewed Reviewed provider established plan for pain management Discussed importance of adherence to all scheduled medical appointments Counseled on the importance of reporting any/all new or changed pain symptoms or management strategies to pain management provider Advised patient to report to care team affect of pain on daily activities Discussed use of relaxation techniques and/or diversional activities to assist with pain reduction (distraction, imagery, relaxation, massage, acupressure, TENS, heat, and cold application Reviewed with patient prescribed pharmacological and nonpharmacological pain relief strategies Assessed social determinant of health barriers  Patient Goals/Self-Care Activities: Take all medications as prescribed Attend all scheduled provider appointments Call pharmacy for medication refills 3-7 days in advance of running out of medications Perform all self care activities independently  Perform IADL's (shopping, preparing meals, housekeeping, managing finances) independently Call provider office for new concerns or questions   Follow  Up Plan:  The patient has been provided with contact information for the care management team and has been advised to call with any health related questions or concerns.  The care management team will reach out to the patient again over the next 60 business  days.

## 2022-10-31 ENCOUNTER — Ambulatory Visit: Payer: Medicaid Other | Admitting: Physical Therapy

## 2022-11-07 ENCOUNTER — Emergency Department (HOSPITAL_COMMUNITY)
Admission: EM | Admit: 2022-11-07 | Discharge: 2022-11-08 | Disposition: A | Discharge status: Other Acute Inpatient Hospital | Payer: No Typology Code available for payment source | Source: Home / Self Care | Attending: Emergency Medicine | Admitting: Emergency Medicine

## 2022-11-07 ENCOUNTER — Other Ambulatory Visit: Payer: Self-pay

## 2022-11-07 ENCOUNTER — Ambulatory Visit: Payer: Medicaid Other

## 2022-11-07 DIAGNOSIS — Z87891 Personal history of nicotine dependence: Secondary | ICD-10-CM | POA: Insufficient documentation

## 2022-11-07 DIAGNOSIS — J45909 Unspecified asthma, uncomplicated: Secondary | ICD-10-CM | POA: Insufficient documentation

## 2022-11-07 DIAGNOSIS — I10 Essential (primary) hypertension: Secondary | ICD-10-CM | POA: Insufficient documentation

## 2022-11-07 DIAGNOSIS — F4310 Post-traumatic stress disorder, unspecified: Secondary | ICD-10-CM | POA: Insufficient documentation

## 2022-11-07 DIAGNOSIS — T50902A Poisoning by unspecified drugs, medicaments and biological substances, intentional self-harm, initial encounter: Secondary | ICD-10-CM

## 2022-11-07 DIAGNOSIS — F251 Schizoaffective disorder, depressive type: Secondary | ICD-10-CM | POA: Insufficient documentation

## 2022-11-07 DIAGNOSIS — T450X2A Poisoning by antiallergic and antiemetic drugs, intentional self-harm, initial encounter: Secondary | ICD-10-CM | POA: Insufficient documentation

## 2022-11-07 DIAGNOSIS — T1491XA Suicide attempt, initial encounter: Secondary | ICD-10-CM

## 2022-11-07 LAB — CBC WITH DIFFERENTIAL/PLATELET
Abs Immature Granulocytes: 0.02 10*3/uL (ref 0.00–0.07)
Basophils Absolute: 0.1 10*3/uL (ref 0.0–0.1)
Basophils Relative: 1 %
Eosinophils Absolute: 0.1 10*3/uL (ref 0.0–0.5)
Eosinophils Relative: 1 %
HCT: 42.7 % (ref 36.0–46.0)
Hemoglobin: 13.6 g/dL (ref 12.0–15.0)
Immature Granulocytes: 0 %
Lymphocytes Relative: 19 %
Lymphs Abs: 2 10*3/uL (ref 0.7–4.0)
MCH: 30 pg (ref 26.0–34.0)
MCHC: 31.9 g/dL (ref 30.0–36.0)
MCV: 94.3 fL (ref 80.0–100.0)
Monocytes Absolute: 0.7 10*3/uL (ref 0.1–1.0)
Monocytes Relative: 7 %
Neutro Abs: 7.3 10*3/uL (ref 1.7–7.7)
Neutrophils Relative %: 72 %
Platelets: 208 10*3/uL (ref 150–400)
RBC: 4.53 MIL/uL (ref 3.87–5.11)
RDW: 12.7 % (ref 11.5–15.5)
WBC: 10.2 10*3/uL (ref 4.0–10.5)
nRBC: 0 % (ref 0.0–0.2)

## 2022-11-07 LAB — SALICYLATE LEVEL: Salicylate Lvl: <7 mg/dL — ABNORMAL LOW (ref 7.0–30.0)

## 2022-11-07 LAB — COMPREHENSIVE METABOLIC PANEL
ALT: 11 U/L (ref 0–44)
AST: 19 U/L (ref 15–41)
Albumin: 3.9 g/dL (ref 3.5–5.0)
Alkaline Phosphatase: 67 U/L (ref 38–126)
Anion gap: 7 (ref 5–15)
BUN: 20 mg/dL (ref 6–20)
CO2: 28 mmol/L (ref 22–32)
Calcium: 9.8 mg/dL (ref 8.9–10.3)
Chloride: 106 mmol/L (ref 98–111)
Creatinine, Ser: 0.84 mg/dL (ref 0.44–1.00)
GFR, Estimated: >60 mL/min (ref >60)
Glucose, Bld: 88 mg/dL (ref 70–99)
Potassium: 4 mmol/L (ref 3.5–5.1)
Sodium: 141 mmol/L (ref 135–145)
Total Bilirubin: 0.6 mg/dL (ref 0.3–1.2)
Total Protein: 7.3 g/dL (ref 6.5–8.1)

## 2022-11-07 LAB — ETHANOL: Alcohol, Ethyl (B): <10 mg/dL (ref <10)

## 2022-11-07 LAB — ACETAMINOPHEN LEVEL: Acetaminophen (Tylenol), Serum: <10 ug/mL — ABNORMAL LOW (ref 10–30)

## 2022-11-07 NOTE — ED Provider Notes (Signed)
[facility] Provider Note   Event Date/Time   First MD Initiated Contact with Patient 11/07/22 1702     (approximate) History  Drug Overdose  HPI Melanie Bell is a 32 y.o. female with a past medical history of PTSD, borderline personality disorder, schizoaffective disorder, morbid obesity, and previous suicide attempt who presents complaining of increasing depression as well as stating that she took 2 handfuls of hydroxyzine approximately 1 hour prior to arrival.  Patient states that she has been under a lot of stress lately and had an argument with her husband leading to this episode.  Patient states "I was just upset and did not know what I was doing".  Patient currently denies any suicidal ideation. ROS: Patient currently denies any vision changes, tinnitus, difficulty speaking, facial droop, sore throat, chest pain, shortness of breath, abdominal pain, nausea/vomiting/diarrhea, dysuria, or weakness/numbness/paresthesias in any extremity   Physical Exam  Triage Vital Signs: ED Triage Vitals [11/07/22 1658]  Enc Vitals Group     BP      Pulse      Resp      Temp      Temp src      SpO2      Weight      Height      Head Circumference      Peak Flow      Pain Score 0     Pain Loc      Pain Edu?      Excl. in GC?    Most recent vital signs: Vitals:   11/07/22 2021 11/07/22 2022  BP: 130/73   Pulse: 70   Resp: 18   Temp:  98.4 F (36.9 C)  SpO2: 97%    General: Awake, oriented x4.  Pupils equally reactive and accommodating to light bilaterally at 3 mm CV:  Good peripheral perfusion.  Resp:  Normal effort.  Abd:  No distention.  Other:  Morbidly obese middle-aged Caucasian female laying in bed in no acute distress.  No signs of rash.  No diaphoresis or warm skin ED Results / Procedures / Treatments  Labs (all labs ordered are listed, but only abnormal results are displayed) Labs Reviewed  ACETAMINOPHEN LEVEL - Abnormal; Notable for the  following components:      Result Value   Acetaminophen (Tylenol), Serum <10 (*)    All other components within normal limits  SALICYLATE LEVEL - Abnormal; Notable for the following components:   Salicylate Lvl <7.0 (*)    All other components within normal limits  COMPREHENSIVE METABOLIC PANEL  ETHANOL  CBC WITH DIFFERENTIAL/PLATELET  URINE DRUG SCREEN, QUALITATIVE ([facility] ONLY)  POC URINE PREG, ED   EKG ED ECG REPORT I, Merwyn Katos, the attending physician, personally viewed and interpreted this ECG. Date: 11/07/2022 EKG Time: 1705 Rate: 89 Rhythm: normal sinus rhythm QRS Axis: normal Intervals: normal ST/T Wave abnormalities: normal Narrative Interpretation: no evidence of acute ischemia PROCEDURES: Critical Care performed: No Procedures MEDICATIONS ORDERED IN ED: Medications - No data to display IMPRESSION / MDM / ASSESSMENT AND PLAN / ED COURSE  I reviewed the triage vital signs and the nursing notes.                             The patient is on the cardiac monitor to evaluate for evidence of arrhythmia and/or significant heart rate changes. Patient's presentation is most consistent with acute presentation with potential  threat to life or bodily function. Thoughts are linear and organized, and patient has no AH, VH, or HI.  Given suicide attempt, patient was IVC'd on arrival Prior suicide attempt by medication overdose Prior Psychiatric Hospitalizations: Multiple  Clinically patient displays no overt toxidrome; they are well appearing, with low suspicion for toxic ingestion given history and exam.  Specifically, no evidence of of anticholinergic symptoms including normal temperature, alert and oriented, urinating without difficulty, and no vision changes Thoughts unlikely 2/2 anemia, hypothyroidism, infection, or ICH.  Consult: Psychiatry to evaluate patient for potential hold for danger to self. Disposition: Plan admit to psychiatry for further management of  symptoms.    FINAL CLINICAL IMPRESSION(S) / ED DIAGNOSES   Final diagnoses:  Suicide attempt [facility])   Rx / DC Orders   ED Discharge Orders     None      Note:  This document was prepared using Dragon voice recognition software and may include unintentional dictation errors.   Merwyn Katos, MD 11/07/22 2226

## 2022-11-07 NOTE — ED Notes (Signed)
Pt NAD, cont to sleep. Pt in hallway without complaints VSS

## 2022-11-07 NOTE — ED Notes (Signed)
IVC pending consult   

## 2022-11-07 NOTE — ED Triage Notes (Signed)
Patient comes in via [facility]. According to EMS, the patient intentionally took an unknown amount of hydroxyzine. According to the patient, she has been under a lot of stress lately. She states that she and her husband have been dealing with medical, and ecenomical challenges which have been very stressful on her. Pt states that she has no thoughts of harming herself or anyone else, and doesn't know what she took the medication. She states she was "just upset, and didn't know what she was doing." Pt is alert and oriented x4, and with no signs of distress at this time.

## 2022-11-08 ENCOUNTER — Inpatient Hospital Stay
Admission: AD | Admit: 2022-11-08 | Discharge: 2022-11-14 | DRG: 885 | Disposition: A | Discharge status: Home / Self Care | Payer: No Typology Code available for payment source | Source: Intra-hospital | Attending: Psychiatry | Admitting: Psychiatry

## 2022-11-08 ENCOUNTER — Ambulatory Visit: Payer: Medicaid Other | Admitting: Internal Medicine

## 2022-11-08 ENCOUNTER — Other Ambulatory Visit: Payer: Self-pay

## 2022-11-08 DIAGNOSIS — Z79899 Other long term (current) drug therapy: Secondary | ICD-10-CM | POA: Diagnosis not present

## 2022-11-08 DIAGNOSIS — Z9049 Acquired absence of other specified parts of digestive tract: Secondary | ICD-10-CM

## 2022-11-08 DIAGNOSIS — F4310 Post-traumatic stress disorder, unspecified: Secondary | ICD-10-CM | POA: Diagnosis present

## 2022-11-08 DIAGNOSIS — F603 Borderline personality disorder: Secondary | ICD-10-CM | POA: Diagnosis present

## 2022-11-08 DIAGNOSIS — F419 Anxiety disorder, unspecified: Secondary | ICD-10-CM | POA: Diagnosis present

## 2022-11-08 DIAGNOSIS — I10 Essential (primary) hypertension: Secondary | ICD-10-CM | POA: Diagnosis present

## 2022-11-08 DIAGNOSIS — F332 Major depressive disorder, recurrent severe without psychotic features: Secondary | ICD-10-CM | POA: Diagnosis present

## 2022-11-08 DIAGNOSIS — T50902D Poisoning by unspecified drugs, medicaments and biological substances, intentional self-harm, subsequent encounter: Secondary | ICD-10-CM | POA: Diagnosis not present

## 2022-11-08 DIAGNOSIS — Z888 Allergy status to other drugs, medicaments and biological substances status: Secondary | ICD-10-CM | POA: Diagnosis not present

## 2022-11-08 DIAGNOSIS — E785 Hyperlipidemia, unspecified: Secondary | ICD-10-CM | POA: Diagnosis present

## 2022-11-08 DIAGNOSIS — Z560 Unemployment, unspecified: Secondary | ICD-10-CM

## 2022-11-08 DIAGNOSIS — Z818 Family history of other mental and behavioral disorders: Secondary | ICD-10-CM

## 2022-11-08 DIAGNOSIS — Z62810 Personal history of physical and sexual abuse in childhood: Secondary | ICD-10-CM

## 2022-11-08 DIAGNOSIS — J45909 Unspecified asthma, uncomplicated: Secondary | ICD-10-CM | POA: Diagnosis present

## 2022-11-08 DIAGNOSIS — Z79890 Hormone replacement therapy (postmenopausal): Secondary | ICD-10-CM | POA: Diagnosis not present

## 2022-11-08 DIAGNOSIS — Z6841 Body Mass Index (BMI) 40.0 and over, adult: Secondary | ICD-10-CM

## 2022-11-08 DIAGNOSIS — F251 Schizoaffective disorder, depressive type: Principal | ICD-10-CM | POA: Diagnosis present

## 2022-11-08 DIAGNOSIS — Z8249 Family history of ischemic heart disease and other diseases of the circulatory system: Secondary | ICD-10-CM

## 2022-11-08 DIAGNOSIS — E079 Disorder of thyroid, unspecified: Secondary | ICD-10-CM | POA: Diagnosis present

## 2022-11-08 DIAGNOSIS — K219 Gastro-esophageal reflux disease without esophagitis: Secondary | ICD-10-CM | POA: Diagnosis present

## 2022-11-08 DIAGNOSIS — Z87891 Personal history of nicotine dependence: Secondary | ICD-10-CM | POA: Diagnosis not present

## 2022-11-08 DIAGNOSIS — T50902A Poisoning by unspecified drugs, medicaments and biological substances, intentional self-harm, initial encounter: Secondary | ICD-10-CM

## 2022-11-08 DIAGNOSIS — R45851 Suicidal ideations: Secondary | ICD-10-CM | POA: Diagnosis present

## 2022-11-08 DIAGNOSIS — Z9151 Personal history of suicidal behavior: Secondary | ICD-10-CM

## 2022-11-08 DIAGNOSIS — Z91013 Allergy to seafood: Secondary | ICD-10-CM

## 2022-11-08 DIAGNOSIS — G43909 Migraine, unspecified, not intractable, without status migrainosus: Secondary | ICD-10-CM | POA: Diagnosis present

## 2022-11-08 DIAGNOSIS — T1491XA Suicide attempt, initial encounter: Secondary | ICD-10-CM | POA: Diagnosis not present

## 2022-11-08 DIAGNOSIS — Z791 Long term (current) use of non-steroidal anti-inflammatories (NSAID): Secondary | ICD-10-CM

## 2022-11-08 DIAGNOSIS — Z825 Family history of asthma and other chronic lower respiratory diseases: Secondary | ICD-10-CM | POA: Diagnosis not present

## 2022-11-08 LAB — URINE DRUG SCREEN, QUALITATIVE (ARMC ONLY)
Amphetamines, Ur Screen: NOT DETECTED
Barbiturates, Ur Screen: NOT DETECTED
Benzodiazepine, Ur Scrn: NOT DETECTED
Cannabinoid 50 Ng, Ur ~~LOC~~: NOT DETECTED
Cocaine Metabolite,Ur ~~LOC~~: NOT DETECTED
MDMA (Ecstasy)Ur Screen: NOT DETECTED
Methadone Scn, Ur: NOT DETECTED
Opiate, Ur Screen: NOT DETECTED
Phencyclidine (PCP) Ur S: NOT DETECTED
Tricyclic, Ur Screen: POSITIVE — AB

## 2022-11-08 LAB — PREGNANCY, URINE: Preg Test, Ur: NEGATIVE

## 2022-11-08 MED ORDER — ESCITALOPRAM OXALATE 10 MG PO TABS
10.0000 mg | ORAL_TABLET | Freq: Every day | ORAL | Status: DC
  Administered 2022-11-08: 10 mg via ORAL
  Filled 2022-11-08: qty 1

## 2022-11-08 MED ORDER — ESCITALOPRAM OXALATE 10 MG PO TABS
10.0000 mg | ORAL_TABLET | Freq: Every day | ORAL | Status: DC
  Administered 2022-11-09 – 2022-11-14 (×6): 10 mg via ORAL
  Filled 2022-11-08 (×6): qty 1

## 2022-11-08 MED ORDER — DIPHENHYDRAMINE HCL 50 MG/ML IJ SOLN: 50.0000 mg | Freq: Three times a day (TID) | INTRAMUSCULAR | Status: DC | PRN

## 2022-11-08 MED ORDER — DIPHENHYDRAMINE HCL 25 MG PO CAPS: 50.0000 mg | ORAL_CAPSULE | Freq: Three times a day (TID) | ORAL | Status: DC | PRN

## 2022-11-08 MED ORDER — HALOPERIDOL 5 MG PO TABS: 5.0000 mg | ORAL_TABLET | Freq: Three times a day (TID) | ORAL | Status: DC | PRN

## 2022-11-08 MED ORDER — LORAZEPAM 2 MG/ML IJ SOLN: 2.0000 mg | Freq: Three times a day (TID) | INTRAMUSCULAR | Status: DC | PRN

## 2022-11-08 MED ORDER — TRAZODONE HCL 50 MG PO TABS
50.0000 mg | ORAL_TABLET | Freq: Every evening | ORAL | Status: DC | PRN
  Administered 2022-11-09 – 2022-11-13 (×6): 50 mg via ORAL
  Filled 2022-11-08 (×5): qty 1

## 2022-11-08 MED ORDER — HYDROXYZINE HCL 50 MG PO TABS
50.0000 mg | ORAL_TABLET | Freq: Two times a day (BID) | ORAL | Status: DC | PRN
  Administered 2022-11-10 (×2): 50 mg via ORAL
  Filled 2022-11-08: qty 1

## 2022-11-08 MED ORDER — ARIPIPRAZOLE 5 MG PO TABS
5.0000 mg | ORAL_TABLET | Freq: Every day | ORAL | Status: DC
  Administered 2022-11-09 – 2022-11-14 (×6): 5 mg via ORAL
  Filled 2022-11-08 (×6): qty 1

## 2022-11-08 MED ORDER — HYDROXYZINE HCL 50 MG PO TABS: 50.0000 mg | ORAL_TABLET | Freq: Two times a day (BID) | ORAL | Status: DC | PRN

## 2022-11-08 MED ORDER — ALUM & MAG HYDROXIDE-SIMETH 200-200-20 MG/5ML PO SUSP
30.0000 mL | ORAL | Status: DC | PRN
  Administered 2022-11-09 – 2022-11-14 (×2): 30 mL via ORAL
  Filled 2022-11-08 (×2): qty 30

## 2022-11-08 MED ORDER — ARIPIPRAZOLE 5 MG PO TABS
5.0000 mg | ORAL_TABLET | Freq: Every day | ORAL | Status: DC
  Administered 2022-11-08: 5 mg via ORAL
  Filled 2022-11-08: qty 1

## 2022-11-08 MED ORDER — LORAZEPAM 2 MG PO TABS: 2.0000 mg | ORAL_TABLET | Freq: Three times a day (TID) | ORAL | Status: DC | PRN

## 2022-11-08 MED ORDER — LITHIUM CARBONATE 300 MG PO CAPS
300.0000 mg | ORAL_CAPSULE | Freq: Two times a day (BID) | ORAL | Status: DC
  Administered 2022-11-09 – 2022-11-14 (×11): 300 mg via ORAL
  Filled 2022-11-08 (×11): qty 1

## 2022-11-08 MED ORDER — ACETAMINOPHEN 325 MG PO TABS
650.0000 mg | ORAL_TABLET | Freq: Four times a day (QID) | ORAL | Status: DC | PRN
  Administered 2022-11-09 – 2022-11-10 (×3): 650 mg via ORAL
  Filled 2022-11-08 (×3): qty 2

## 2022-11-08 MED ORDER — HALOPERIDOL LACTATE 5 MG/ML IJ SOLN: 5.0000 mg | Freq: Three times a day (TID) | INTRAMUSCULAR | Status: DC | PRN

## 2022-11-08 MED ORDER — MAGNESIUM HYDROXIDE 400 MG/5ML PO SUSP
30.0000 mL | Freq: Every day | ORAL | Status: DC | PRN
  Administered 2022-11-09: 30 mL via ORAL
  Filled 2022-11-08: qty 30

## 2022-11-08 MED ORDER — LITHIUM CARBONATE 300 MG PO CAPS
300.0000 mg | ORAL_CAPSULE | Freq: Two times a day (BID) | ORAL | Status: DC
  Administered 2022-11-08: 300 mg via ORAL
  Filled 2022-11-08: qty 1

## 2022-11-08 NOTE — Group Note (Signed)
Date:  11/08/2022 Time:  9:19 PM  Group Topic/Focus:  Wrap-Up Group:   The focus of this group is to help patients review their daily goal of treatment and discuss progress on daily workbooks.    Participation Level:  Minimal  Participation Quality:  Appropriate  Affect:  Appropriate  Cognitive:  Alert  Insight: Appropriate  Engagement in Group:  None  Modes of Intervention:  Clarification  Additional Comments:      Bell,Melanie Motton E 11/08/2022, 9:19 PM  

## 2022-11-08 NOTE — ED Provider Notes (Signed)
Patient has been seen by psychiatry and they are recommending inpatient admission.  She is awaiting a bed currently.   Georga Hacking, MD 11/08/22 1455

## 2022-11-08 NOTE — ED Notes (Signed)
Hospital meal provided, pt tolerated w/o complaints.  Waste discarded appropriately.  

## 2022-11-08 NOTE — Progress Notes (Signed)
Patient calm and pleasant during assessment denying SI/HI/AVH. Pt endorses depression. Pt observed interacting appropriately with staff and peers on the unit. Pt didn't have any medications scheduled tonight and hasn't requested any PRN medications as of now. Pt being monitored Q 15 minutes for safety per unit protocol, remains safe on the unit  

## 2022-11-08 NOTE — ED Notes (Signed)
Pt requested shower; provided clean hospital clothing and linens.  Shower setup provided with soap, shampoo, toothbrush/toothpaste, and deodorant.  Pt able to preform own ADL's with no assistance.    

## 2022-11-08 NOTE — Tx Team (Signed)
Initial Treatment Plan 11/08/2022 7:00 PM Deshondra Worst [id number]    PATIENT STRESSORS: Health problems   Marital or family conflict     PATIENT STRENGTHS: Communication skills  General fund of knowledge  Motivation for treatment/growth  Supportive family/friends    PATIENT IDENTIFIED PROBLEMS: Suicide Attempt by OD  Marital issues  Health issues                 DISCHARGE CRITERIA:  Ability to meet basic life and health needs Improved stabilization in mood, thinking, and/or behavior Medical problems require only outpatient monitoring Motivation to continue treatment in a less acute level of care Reduction of life-threatening or endangering symptoms to within safe limits  PRELIMINARY DISCHARGE PLAN: Outpatient therapy Return to previous living arrangement  PATIENT/FAMILY INVOLVEMENT: This treatment plan has been presented to and reviewed with the patient, Janeshia Ciliberto. The patient has been given the opportunity to ask questions and make suggestions.  Aileen Amore, RN 11/08/2022, 7:00 PM
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
## 2022-11-08 NOTE — Plan of Care (Signed)
New admission.  Problem: Education: Goal: Knowledge of General Education information will improve Description: Including pain rating scale, medication(s)/side effects and non-pharmacologic comfort measures Outcome: Not Progressing   Problem: Health Behavior/Discharge Planning: Goal: Ability to manage health-related needs will improve Outcome: Not Progressing   Problem: Clinical Measurements: Goal: Ability to maintain clinical measurements within normal limits will improve Outcome: Not Progressing Goal: Will remain free from infection Outcome: Not Progressing Goal: Diagnostic test results will improve Outcome: Not Progressing Goal: Respiratory complications will improve Outcome: Not Progressing Goal: Cardiovascular complication will be avoided Outcome: Not Progressing   Problem: Activity: Goal: Risk for activity intolerance will decrease Outcome: Not Progressing   Problem: Nutrition: Goal: Adequate nutrition will be maintained Outcome: Not Progressing   Problem: Coping: Goal: Level of anxiety will decrease Outcome: Not Progressing   Problem: Elimination: Goal: Will not experience complications related to bowel motility Outcome: Not Progressing Goal: Will not experience complications related to urinary retention Outcome: Not Progressing   Problem: Pain Managment: Goal: General experience of comfort will improve Outcome: Not Progressing   Problem: Safety: Goal: Ability to remain free from injury will improve Outcome: Not Progressing   Problem: Skin Integrity: Goal: Risk for impaired skin integrity will decrease Outcome: Not Progressing   Problem: Education: Goal: Knowledge of [place] [facility] Education information/materials will improve Outcome: Not Progressing Goal: Emotional status will improve Outcome: Not Progressing Goal: Mental status will improve Outcome: Not Progressing Goal: Verbalization of understanding the information provided will  improve Outcome: Not Progressing   Problem: Safety: Goal: Periods of time without injury will increase Outcome: Not Progressing   Problem: Self-Concept: Goal: Ability to disclose and discuss suicidal ideas will improve Outcome: Not Progressing Goal: Will verbalize positive feelings about self Outcome: Not Progressing   

## 2022-11-08 NOTE — ED Notes (Signed)
Ivc/pending consult 

## 2022-11-08 NOTE — ED Notes (Signed)
Pt assisted to the shower with Mel, ed tech

## 2022-11-08 NOTE — Progress Notes (Deleted)
[name]  MRN/ DOB: [id number], [birth year]    Age/ Sex: 32 y.o., female     PCP: [name], MD   Reason for Endocrinology Evaluation: Hyperprolactinemia      Initial Endocrinology Clinic Visit: 08/02/2019    PATIENT IDENTIFIER: Melanie Bell is a 32 y.o., female with a past medical history of Bipolar disorder and anxiety. She has followed with [facility] since 08/02/2019 for consultative assistance with management of her hyperprolactinemia .   HISTORICAL SUMMARY:  Pt was noted to have an elevated prolactin level at 66.3 ng/mL, during evaluation of secondary amenorrhea and galactorrhea in 06/2019.    She does endorse history of elevated prolactin since her teenage years.  She has been diagnosed with bipolar disorder, PTSD and anxiety and has been on multiple antipsychotic medications over the years.  She has been on ox carbamazepine as well as risperidone in the past.   Prolactin on initial presentation to our clinic was 40.2 ng/mL, with normal LH, FSH, Testosterone and TSH    An MRI was obtained 09/09/2019 which demonstrated normal pituitary but abnormal white matter.  Pt was advised to discuss switching Invega with psych as it has been associated with elevated prolactin .    She was seen by Dr. Everlena Cooper ( Neurology for abnormal white matter)   Sister  And mother with hypothyroidism and  Sister with hyperthyroidism    She was started on cabergoline 04/2021 with elevated prolactin again with a max level of 62.7 ng/mL     Invega switched to Abilify 2023 by psychiatry   HYPOTHYROID HISTORY:  Patient reports being on LT-4 replacement therapy since her teenage years, she had ran out of her medication by 06/2019 and was advised not to restart since her TFTs were normal. Repeat TSH elevated in 07/2019 was elevated at 4.77 uIU/mL. And LT-4 replacement restarted.     Got married 1/7th 2023 She does not have a guardian anymore, reinstated  her rights 07/2020   SUBJECTIVE:    Today (12/20/2019):  [name] is here for hyperprolactinemia and hypothyroidism.    Since her last visit here she has been to the ED multiple times for variable reasons including multiple falls, skin burn, knee pain, abdominal pain, and viral infections including influenza and COVID, her last visit due to suicidal attempt after arguing with her husband with taking handful of hydroxyzine.      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 and recently dx with DM   Medications Levothyroxine 75 mcg daily  Carbergoline 0.5 mg, 1 tab weekly        HISTORY:  Past Medical History:  Past Medical History:  Diagnosis Date   Allergy    Anxiety    Arthritis    Asthma    Depression    Foreign body aspiration    GERD (gastroesophageal reflux disease)    Hallucinations 09/30/2014   Sizoaffective   Hyperlipidemia    Hypertension    Hypoglycemia    Intentional ingestion of batteries 04/21/2016    2 AAA batteries and 1 thumb tack; intent to hurt herself/notes 04/21/2016   Self-inflicted injury 11/23/2017   Suicidal ideation 01/02/2018   Tardive dyskinesia 10/2014   recent onset  Thyroid disease    Past Surgical History:  Past Surgical History:  Procedure Laterality Date   ABDOMINAL SURGERY     "years ago" to remove foreign objects   APPENDECTOMY     BREAST EXCISIONAL BIOPSY Right 09/03/2007   surgical bx removed fibroadeoma   BREAST LUMPECTOMY Right    CHOLECYSTECTOMY  02/2022   COLONOSCOPY WITH PROPOFOL N/A 09/10/2015   Procedure: COLONOSCOPY WITH PROPOFOL;  Surgeon: Christena Deem, MD;  Location: [facility] ENDOSCOPY;  Service: Endoscopy;  Laterality: N/A;   ESOPHAGOGASTRODUODENOSCOPY N/A 11/28/2014    Procedure: ESOPHAGOGASTRODUODENOSCOPY (EGD);  Surgeon: Scot Jun, MD;  Location: [facility] ENDOSCOPY;  Service: Endoscopy;  Laterality: N/A;   ESOPHAGOGASTRODUODENOSCOPY N/A 02/21/2016   Procedure: ESOPHAGOGASTRODUODENOSCOPY (EGD);  Surgeon: Napoleon Form, MD;  Location: [facility] ENDOSCOPY;  Service: Endoscopy;  Laterality: N/A;   ESOPHAGOGASTRODUODENOSCOPY N/A 04/21/2016   Procedure: ESOPHAGOGASTRODUODENOSCOPY (EGD);  Surgeon: Iva Boop, MD;  Location: [facility] ENDOSCOPY;  Service: Endoscopy;  Laterality: N/A;   ESOPHAGOGASTRODUODENOSCOPY N/A 05/06/2016   Procedure: ESOPHAGOGASTRODUODENOSCOPY (EGD);  Surgeon: Wyline Mood, MD;  Location: [facility] ENDOSCOPY;  Service: Gastroenterology;  Laterality: N/A;   ESOPHAGOGASTRODUODENOSCOPY N/A 06/13/2016   Procedure: ESOPHAGOGASTRODUODENOSCOPY (EGD);  Surgeon: Midge Minium, MD;  Location: [facility] ENDOSCOPY;  Service: Endoscopy;  Laterality: N/A;   ESOPHAGOGASTRODUODENOSCOPY N/A 01/14/2022   Procedure: ESOPHAGOGASTRODUODENOSCOPY (EGD);  Surgeon: Toledo, Boykin Nearing, MD;  Location: [facility] ENDOSCOPY;  Service: Gastroenterology;  Laterality: N/A;   ESOPHAGOGASTRODUODENOSCOPY (EGD) WITH PROPOFOL N/A 02/29/2016   Procedure: ESOPHAGOGASTRODUODENOSCOPY (EGD) WITH PROPOFOL;  Surgeon: Midge Minium, MD;  Location: [facility] ENDOSCOPY;  Service: Endoscopy;  Laterality: N/A;   ESOPHAGOGASTRODUODENOSCOPY (EGD) WITH PROPOFOL N/A 01/15/2018   Procedure: ESOPHAGOGASTRODUODENOSCOPY (EGD) WITH PROPOFOL;  Surgeon: Corbin Ade, MD;  Location: [facility] ENDO [facility];  Service: Endoscopy;  Laterality: N/A;  retrieval forein body   LAPAROSCOPIC LYSIS OF ADHESIONS  02/16/2022   Procedure: LAPAROSCOPIC LYSIS OF ADHESIONS;  Surgeon: Henrene Dodge, MD;  Location: [facility] ORS;  Service: General;;   LAPAROTOMY N/A 09/12/2015   Procedure: EXPLORATORY LAPAROTOMY;  Surgeon: Lattie Haw, MD;  Location: [facility] ORS;  Service: General;  Laterality: N/A;   SIGMOIDOSCOPY N/A 09/12/2015   Procedure: Daiva Huge;   Surgeon: Lattie Haw, MD;  Location: [facility] ORS;  Service: General;  Laterality: N/A;   WISDOM TOOTH EXTRACTION     Social History:  reports that she quit smoking about 6 years ago. Her smoking use included cigarettes. She has a 1.50 pack-year smoking history. She has been exposed to tobacco smoke. She has never used smokeless tobacco. She reports that she does not drink alcohol and does not use drugs. Family History:  Family History  Problem Relation Age of Onset   Depression Mother    Hypertension Mother    Sleep apnea Mother    Asthma Mother    COPD Mother    Diabetes Mother    Anxiety disorder Mother    Arthritis Mother    Heart failure Mother    Breast cancer Maternal Aunt        20's   Stroke Maternal Grandmother    Heart attack Maternal Grandfather      HOME MEDICATIONS: Allergies as of 05/02/2022       Reactions   Betadine [povidone Iodine] Other (See Comments)   Reaction:  Unknown    Fish Allergy Hives   Iodine Rash   Shellfish-derived Products Other (See Comments)   Reaction:  Unknown         Medication List  Accurate as of May 02, 2022  2:30 PM. If you have any questions, ask your nurse or doctor.          STOP taking these medications    acetaminophen 500 MG tablet Commonly known as: TYLENOL Stopped by: Scarlette Shorts, MD       TAKE these medications    albuterol 108 (90 Base) MCG/ACT inhaler Commonly known as: VENTOLIN HFA 2 puffs four times a day as needed   ARIPiprazole 5 MG tablet Commonly known as: ABILIFY Take 5 mg by mouth daily.   cabergoline 0.5 MG tablet Commonly known as: DOSTINEX TAKE 1 TABLET (0.5 MG TOTAL) BY MOUTH 2 (TWO) TIMES A WEEK.    escitalopram 10 MG tablet Commonly known as: LEXAPRO Take 10 mg by mouth daily.   fluticasone-salmeterol 45-21 MCG/ACT inhaler Commonly known as: ADVAIR HFA Inhale 2 puffs into the lungs 2 (two) times daily.   hydrOXYzine 50 MG capsule Commonly known as:  VISTARIL Take 50 mg by mouth 2 (two) times daily as needed.   levothyroxine 75 MCG tablet Commonly known as: SYNTHROID Take 1 tablet (75 mcg total) by mouth as directed. Two tablets on Sundays and Wednesday and 1 tablet rest of the week   lithium carbonate 300 MG capsule Take 1 capsule (300 mg total) by mouth 2 (two) times daily with a meal.   magnesium oxide 400 MG tablet Commonly known as: MAG-OX Take 400 mg by mouth daily.   omeprazole 20 MG capsule Commonly known as: PRILOSEC TAKE 1 CAPSULE (20 MG TOTAL) BY MOUTH DAILY.   ondansetron 4 MG disintegrating tablet Commonly known as: ZOFRAN-ODT Take 1 tablet (4 mg total) by mouth every 8 (eight) hours as needed for nausea or vomiting.   prazosin 2 MG capsule Commonly known as: MINIPRESS Take 1 capsule (2 mg total) by mouth at bedtime.   pregabalin 50 MG capsule Commonly known as: LYRICA Take 50 mg by mouth 2 (two) times daily.   SUMAtriptan 20 MG/ACT nasal spray Commonly known as: IMITREX Place 20 mg into the nose as needed.   topiramate 25 MG tablet Commonly known as: TOPAMAX Take 25 mg by mouth 2 (two) times daily.          OBJECTIVE:   PHYSICAL EXAM: VS: There were no vitals taken for this visit.  EXAM: General: Pt appears well and is in NAD  Neck: General: Supple without adenopathy. Thyroid: Thyroid size normal.    Lungs: Clear with good BS bilat with no rales, rhonchi, or wheezes  Heart: Auscultation: RRR.  Abdomen: Normoactive bowel sounds, soft, nontender, without masses or organomegaly palpable  Extremities:  BL LE: No pretibial edema normal ROM and strength.  Mental Status: Judgment, insight: Intact Mood and affect: No depression, anxiety, or agitation     DATA REVIEWED:    Latest Reference Range & Units 05/02/22 10:35  Prolactin ng/mL 3.1  Glucose 70 - 99 mg/dL 95  TSH 4.09 - 8.11 uIU/mL 1.12     Latest Reference Range & Units 05/02/22 10:35  Sodium 135 - 145 mEq/L 140  Potassium 3.5  - 5.1 mEq/L 4.0  Chloride 96 - 112 mEq/L 105  CO2 19 - 32 mEq/L 28  Glucose 70 - 99 mg/dL 95  BUN 6 - 23 mg/dL 14  Creatinine 9.14 - 7.82 mg/dL 9.56  Calcium 8.4 - 21.3 mg/dL 9.9  GFR >08.65 mL/min 101.71       Latest Reference Range & Units 05/02/22 10:35  Sodium 135 -  145 mEq/L 140  Potassium 3.5 - 5.1 mEq/L 4.0  Chloride 96 - 112 mEq/L 105  CO2 19 - 32 mEq/L 28  Glucose 70 - 99 mg/dL 95  BUN 6 - 23 mg/dL 14  Creatinine 2.84 - 1.32 mg/dL 4.40  Calcium 8.4 - 10.2 mg/dL 9.9  GFR >72.53 mL/min 101.71    Latest Reference Range & Units 05/02/22 10:35  TSH 0.35 - 5.50 uIU/mL 1.12     MRI brain 09/09/2019 Scattered foci of T2 hyperintensity are seen within the white matter of the cerebral hemispheres predominantly deep and juxtacortical white matter. No posterior fossa lesion identified. None of the lesions show enhancement after the intravenous administration of contrast.   Pituitary/Sella: The pituitary gland is normal in appearance and size for age without mass lesion. Mildly heterogeneous enhancement in the dynamic phase with homogeneous enhancement on the delayed is within normal limits. A normal posterior pituitary bright spot is seen. The infundibulum is midline. The hypothalamus and mamillary bodies are normal. There is no mass effect on the optic chiasm or optic nerves. The infundibular and chiasmatic recesses are clear. Normal cavernous sinus and cavernous internal carotid artery flow voids.  ASSESSMENT / PLAN / RECOMMENDATIONS:    1.   Hypothyroidism   - Pt is clinically and biochemically euthyroid   - No local neck symptoms   -TSH normal Medication  Continue levothyroxine 75 mcg daily    2.Hyperprolactinemia:     -Patient with chronic long-term history of intermittent hyperprolactinemia.This is attributed to multiple antipsychotic medications that have she has been on and off since her teenage years, that are known to cause hyperprolactinemia such as  risperidone and paliperidone Hinda Glatter ) -Repeat prolactin levels have started to increase in 2022, requiring initiation of small dose of cabergoline - MRI did NOT show any evidence of pituitary adenoma  -Patient advised to stop cabergoline with positive pregnancy test - Decrease Cabergoline to once weekly, since her prolactin is normal     Medication   Decrease  cabergoline 0.5 mg, 1 tablet  weekly   F/U in 6 months     Signed electronically by: Lyndle Herrlich, MD  [facility] Endocrinology  [facility] [address] [place], [place] [address] Phone: [phone] FAX: [phone]      CC: [name], MD [address] [place] [address] Phone: [phone]  Fax: [phone]   Return to Endocrinology clinic as below: Future Appointments  Date Time Provider Department Center  11/08/2022 10:10 AM Nashley Cordoba, Konrad Dolores, MD [facility] None  11/21/2022 11:20 AM Debbe Odea, MD [facility] None  12/27/2022  9:00 AM Craft, Calvert Cantor, RN [facility] None  01/11/2023  1:00 PM PCM-ANNUAL WELLNESS VISIT [facility]

## 2022-11-08 NOTE — BH Assessment (Signed)
Patient is to be admitted to [facility] BMU tonight 11/08/22 after 7:30pm by Dr.  Reita May .  Attending Physician will be Dr.  Reita May .   Patient has been assigned to room 313, by [facility] Charge Nurse, Taylon Coole.    ER staff is aware of the admission: Luann, ER Secretary   Dr. Lenard Lance, ER MD  Pattricia Boss, Patient's Nurse  Rob, Patient Access.

## 2022-11-08 NOTE — ED Notes (Signed)
Pt up to the bathroom, gait steady.

## 2022-11-08 NOTE — ED Notes (Signed)
Patient belongings: 3 gray rings 1 pair of pink shorts 1 gray shirt 1 Bra  1 pair of shoes  1 pair of socks 1 green bag 2 cell phones

## 2022-11-08 NOTE — ED Notes (Signed)
IVC/ Consult pending/ Moved to BHU -7 

## 2022-11-08 NOTE — ED Notes (Signed)
Pt is tearful and states that she wants to go home, states that she doesn't want to die, she states that she's not sure why she did what she did, she was just frustrated, pt encouraged to share her feelings when she talks to psych today

## 2022-11-08 NOTE — ED Notes (Signed)
Pt requesting to take a shower, pt informed that we are getting things together for patients to start to shower and that she would be given the chance soon

## 2022-11-08 NOTE — BH Assessment (Signed)
Comprehensive Clinical Assessment (CCA) Note  11/08/2022 Melanie Bell [id number]  Melanie Bell, 32 year old female who presents to [facility] ED involuntarily for treatment. Per triage note, Patient comes in via [facility] from a [facility] that she walked to. According to EMS, the patient intentionally took an unknown amount of hydroxyzine. According to the patient, she has been under a lot of stress lately. She states that she and her husband have been dealing with medical, and economical challenges which have been very stressful on her. Pt states that she has no thoughts of harming herself or anyone else, and doesn't know what she took the medication. She states she was "just upset, and didn't know what she was doing." Pt is alert and oriented x4, and with no signs of distress at this time.   During TTS assessment pt presents alert and oriented x 4, restless but cooperative, and mood-congruent with affect. The pt does not appear to be responding to internal or external stimuli. Neither is the pt presenting with any delusional thinking. Pt verified the information provided to triage RN.   Pt identifies her main complaint to be that she was upset, wasn't thinking clearly and impulsively, just took the pills. Patient states she and her husband have been going through a lot and she became overwhelmed all at once. Patient states her husband's health, lack of finances, and transportation have all been contributing factors. Patient denies using any illicit substances and alcohol. Patient reports she is compliant with her medications, taking them as prescribed. Patient reports she is being followed by Dr. Mervyn Skeeters at [facility]. Pt reports INPT hx from a previous attempt back in 2020. Patient states she regrets what happened and she doesn't want to be in the hospital, but she understands. Pt denies current SI/HI/AH/VH.    Per Christal, NP: I spoke with the patient's husband, Melanie Bell, who reports that she accidentally took  a large number of pills and then left the house on foot.  He states she has been stable for the past 3 to 4 years and is unsure what triggered the recent episode. Melanie Bell reports no safety concerns and denies having firearms in the home. He confirms her history of psychiatric hospitalizations but is uncertain about the details.   Per Christal, NP, pt is recommended for inpatient psychiatric admission.    Chief Complaint:  Chief Complaint  Patient presents with   Drug Overdose   Visit Diagnosis: Suicide attempt    CCA Screening, Triage and Referral (STR)  Patient Reported Information How did you hear about [name]? -- (EMS)  Referral name: No data recorded Referral phone number: No data recorded  Whom do you see for routine medical problems? No data recorded Practice/Facility Name: No data recorded Practice/Facility Phone Number: No data recorded Name of Contact: No data recorded Contact Number: No data recorded Contact Fax Number: No data recorded Prescriber Name: No data recorded Prescriber Address (if known): No data recorded  What Is the Reason for Your Visit/Call Today? Patient reports she was overwhelmed and took a "bunch of pills."  How Long Has This Been Causing You Problems? > than 6 months  What Do You Feel Would Help You the Most Today? Treatment for Depression or other mood problem; Stress Management; Medication(s)   Have You Recently Been in Any Inpatient Treatment (Hospital/Detox/Crisis Center/28-Day Program)? No data recorded Name/Location of Program/Hospital:No data recorded How Long Were You There? No data recorded When Were You Discharged? No data recorded  Have You Ever Received  Services From [facility] Before? No data recorded Who Do You See at [facility]? No data recorded  Have You Recently Had Any Thoughts About Hurting Yourself? Yes  Are You Planning to Commit Suicide/Harm Yourself At This time? No   Have you Recently Had Thoughts About Hurting  Someone [name]? No  Explanation: No data recorded  Have You Used Any Alcohol or Drugs in the Past 24 Hours? No  How Long Ago Did You Use Drugs or Alcohol? No data recorded What Did You Use and How Much? No data recorded  Do You Currently Have a Therapist/Psychiatrist? Yes  Name of Therapist/Psychiatrist: Dr. Mervyn Skeeters- [facility]   Have You Been Recently Discharged From Any Office Practice or Programs? No  Explanation of Discharge From Practice/Program: No data recorded    CCA Screening Triage Referral Assessment Type of Contact: Face-to-Face  Is this Initial or Reassessment? No data recorded Date Telepsych consult ordered in [facility]:  No data recorded Time Telepsych consult ordered in [facility]:  No data recorded  Patient Reported Information Reviewed? No data recorded Patient Left Without Being Seen? No data recorded Reason for Not Completing Assessment: No data recorded  Collateral Involvement: None provided   Does Patient Have a Court Appointed Legal Guardian? No data recorded Name and Contact of Legal Guardian: No data recorded If Minor and Not Living with Parent(s), Who has Custody? No data recorded Is CPS involved or ever been involved? Never  Is APS involved or ever been involved? Never   Patient Determined To Be At Risk for Harm To Self or Others Based on Review of Patient Reported Information or Presenting Complaint? Yes, for Self-Harm  Method: Plan without intent  Availability of Means: No access or NA  Intent: Vague intent or NA  Notification Required: No need or identified person  Additional Information for Danger to Others Potential: No data recorded Additional Comments for Danger to Others Potential: No data recorded Are There Guns or Other Weapons in Your Home? No data recorded Types of Guns/Weapons: No data recorded Are These Weapons Safely Secured?                            No data recorded Who Could Verify You Are Able To Have These Secured: No data  recorded Do You Have any Outstanding Charges, Pending Court Dates, Parole/Probation? No data recorded Contacted To Inform of Risk of Harm To Self or Others: No data recorded  Location of Assessment: [facility]   Does Patient Present under Involuntary Commitment? Yes  IVC Papers Initial File Date: No data recorded  [place] of Residence: [place]   Patient Currently Receiving the Following Services: Medication Management   Determination of Need: Emergent (2 hours)   Options For Referral: ED Visit; Inpatient Hospitalization; Medication Management     CCA Biopsychosocial Intake/Chief Complaint:  No data recorded Current Symptoms/Problems: No data recorded  Patient Reported Schizophrenia/Schizoaffective Diagnosis in Past: No   Strengths: Patient able to communicate and verbalize needs.  Preferences: No data recorded Abilities: No data recorded  Type of Services Patient Feels are Needed: No data recorded  Initial Clinical Notes/Concerns: No data recorded  Mental Health Symptoms Depression:   Change in energy/activity; Difficulty Concentrating; Sleep (too much or little)   Duration of Depressive symptoms:  Greater than two weeks   Mania:   N/A   Anxiety:    Difficulty concentrating; Worrying   Psychosis:   None   Duration of Psychotic  symptoms: No data recorded  Trauma:   N/A   Obsessions:   N/A   Compulsions:   N/A   Inattention:   N/A   Hyperactivity/Impulsivity:   N/A   Oppositional/Defiant Behaviors:   N/A   Emotional Irregularity:   Potentially harmful impulsivity   Other Mood/Personality Symptoms:  No data recorded   Mental Status Exam Appearance and self-care  Stature:   Average   Weight:   Average weight   Clothing:   Disheveled   Grooming:   Normal   Cosmetic use:   None   Posture/gait:   Tense   Motor activity:   Not Remarkable   Sensorium  Attention:   Normal   Concentration:   Normal   Orientation:   X5    Recall/memory:   Normal   Affect and Mood  Affect:   Depressed; Flat   Mood:   Depressed; Hopeless   Relating  Eye contact:   Normal   Facial expression:   Depressed; Sad   Attitude toward examiner:   Cooperative   Thought and Language  Speech flow:  Clear and Coherent   Thought content:   Appropriate to Mood and Circumstances   Preoccupation:   None   Hallucinations:   None   Organization:  No data recorded  [employer] of Knowledge:   Average   Intelligence:   Average   Abstraction:   Normal   Judgement:   Impaired   Reality Testing:   Adequate   Insight:   Fair   Decision Making:   Impulsive   Social Functioning  Social Maturity:   Impulsive   Social Judgement:   Victimized   Stress  Stressors:   Illness; Financial; Other (Comment) (Lack of transportation)   Coping Ability:   Deficient supports; Overwhelmed   Skill Deficits:   Decision making   Supports:  No data recorded    Religion:    Leisure/Recreation:    Exercise/Diet: Exercise/Diet Do You Have Any Trouble Sleeping?: Yes Explanation of Sleeping Difficulties: Patient reports she has not been able to sleep.   CCA Employment/Education Employment/Work Situation: Employment / Work Systems developer: On disability Why is Patient on Disability: Mental Health How Long has Patient Been on Disability: Pt. reports that she has been on disability since before the age of 41.  Education:     CCA Family/Childhood History Family and Relationship History: Family history Marital status: Married Does patient have children?: Yes (Step son) How many children?: 1  Childhood History:     Child/Adolescent Assessment:     CCA Substance Use Alcohol/Drug Use: Alcohol / Drug Use Pain Medications: see MAR Prescriptions: see MAR Over the Counter: see MAR History of alcohol / drug use?: No history of alcohol / drug abuse Longest period of  sobriety (when/how long): Pt  denies drug use                         ASAM's:  Six Dimensions of Multidimensional Assessment  Dimension 1:  Acute Intoxication and/or Withdrawal Potential:      Dimension 2:  Biomedical Conditions and Complications:      Dimension 3:  Emotional, Behavioral, or Cognitive Conditions and Complications:     Dimension 4:  Readiness to Change:     Dimension 5:  Relapse, Continued use, or Continued Problem Potential:     Dimension 6:  Recovery/Living Environment:     ASAM Severity Score:    ASAM  Recommended Level of Treatment:     Substance use Disorder (SUD)    Recommendations for Services/Supports/Treatments:    DSM5 Diagnoses: Patient Active Problem List   Diagnosis Date Noted   Intentional overdose (HCC) 11/08/2022   Amenorrhea 09/05/2022   Possible pregnancy 09/05/2022   Symptomatic cholelithiasis 02/16/2022   Peritoneal adhesions without obstruction 02/16/2022   Hematemesis 01/14/2022   Encounter for supervision of other normal pregnancy, first trimester 11/12/2021   Morbid obesity (HCC) 09/30/2021   Acquired hypothyroidism 06/25/2021   Migraine without aura and without status migrainosus, not intractable 03/05/2021   Mild intermittent asthma without complication 12/01/2020   Gastroesophageal reflux disease 12/01/2020   Hypoglycemia 12/01/2020   Rheumatoid factor positive 12/09/2019   SS-A antibody positive 10/28/2019   Chronic pain of both knees 10/28/2019   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   Schizoaffective disorder, depressive type (HCC) 02/02/2018   Suicide attempt (HCC) 01/16/2018   Dysfunctional uterine bleeding 01/06/2018   Borderline personality disorder (HCC) 01/04/2018   PTSD (post-traumatic stress disorder) 01/03/2018   ASCUS of cervix with negative high risk HPV 08/28/2017   Malingering 05/06/2016    Patient Centered Plan: Patient is on the following Treatment Plan(s):  Anxiety and Depression   Referrals  to Alternative Service(s): Referred to Alternative Service(s):   Place:   Date:   Time:    Referred to Alternative Service(s):   Place:   Date:   Time:    Referred to Alternative Service(s):   Place:   Date:   Time:    Referred to Alternative Service(s):   Place:   Date:   Time:      @BHCOLLABOFCARE @  Lavayah Vita R Theatre manager, Counselor, LCAS-A

## 2022-11-08 NOTE — ED Notes (Signed)
Patient calm and cooperative. Pt given sandwich tray. Provided urine sample. Patient dressed out in purple scrubs. Patient complaining of feeling "sleepy." Patient able to ambulate independently to restroom. No distress or abnormal behavior noted. Will continue to monitor with security cameras. Q 15 minute rounds continue.

## 2022-11-08 NOTE — Progress Notes (Signed)
Admission note; Consents signed, literature detailing the patient's rights, responsibilities, and visitor guidelines provided. Skin search performed with old midline abdominal scar noted, otherwise WNL. She denies SI/HI AVH but endorses depression and "just want to go home; I can't  believe what I did".  Belongings search completed with no contraband. She was oriented to he room and to unit as well. No questions or concerns at present. She contracts for safety and remains safe on the unit at this time.

## 2022-11-09 DIAGNOSIS — T50902D Poisoning by unspecified drugs, medicaments and biological substances, intentional self-harm, subsequent encounter: Secondary | ICD-10-CM | POA: Diagnosis not present

## 2022-11-09 MED ORDER — MELOXICAM 7.5 MG PO TABS
15.0000 mg | ORAL_TABLET | Freq: Every day | ORAL | Status: DC
  Administered 2022-11-09 – 2022-11-14 (×6): 15 mg via ORAL
  Filled 2022-11-09 (×6): qty 2

## 2022-11-09 MED ORDER — MAGNESIUM OXIDE 400 MG PO TABS
400.0000 mg | ORAL_TABLET | Freq: Every day | ORAL | Status: DC
  Administered 2022-11-10 – 2022-11-14 (×5): 400 mg via ORAL
  Filled 2022-11-09 (×6): qty 1

## 2022-11-09 MED ORDER — PRAZOSIN HCL 2 MG PO CAPS
2.0000 mg | ORAL_CAPSULE | Freq: Every day | ORAL | Status: DC
  Administered 2022-11-09: 2 mg via ORAL
  Filled 2022-11-09 (×4): qty 1

## 2022-11-09 MED ORDER — PREGABALIN 50 MG PO CAPS
50.0000 mg | ORAL_CAPSULE | Freq: Two times a day (BID) | ORAL | Status: DC
  Administered 2022-11-09 – 2022-11-14 (×10): 50 mg via ORAL
  Filled 2022-11-09 (×10): qty 1

## 2022-11-09 MED ORDER — ONDANSETRON 4 MG PO TBDP: 4.0000 mg | ORAL_TABLET | Freq: Three times a day (TID) | ORAL | Status: DC | PRN

## 2022-11-09 MED ORDER — PANTOPRAZOLE SODIUM 40 MG PO TBEC
40.0000 mg | DELAYED_RELEASE_TABLET | Freq: Every day | ORAL | Status: DC
  Administered 2022-11-09 – 2022-11-14 (×6): 40 mg via ORAL
  Filled 2022-11-09 (×6): qty 1

## 2022-11-09 MED ORDER — LEVOTHYROXINE SODIUM 50 MCG PO TABS
75.0000 ug | ORAL_TABLET | Freq: Every day | ORAL | Status: DC
  Administered 2022-11-10 – 2022-11-14 (×5): 75 ug via ORAL
  Filled 2022-11-09 (×5): qty 2

## 2022-11-09 MED ORDER — CABERGOLINE 0.5 MG PO TABS
0.5000 mg | ORAL_TABLET | ORAL | Status: DC
  Administered 2022-11-14: 0.5 mg via ORAL
  Filled 2022-11-09: qty 1

## 2022-11-09 MED ORDER — ALBUTEROL SULFATE HFA 108 (90 BASE) MCG/ACT IN AERS: 2.0000 | INHALATION_SPRAY | Freq: Four times a day (QID) | RESPIRATORY_TRACT | Status: DC | PRN

## 2022-11-09 NOTE — Group Note (Signed)
Recreation Therapy Group Note   Group Topic:Problem Solving  Group Date: 11/09/2022 Start Time: 1000 End Time: 1050 Facilitators: Rosina Lowenstein, LRT, CTRS Location:  Craft Room  Group Description: Life Boat. Patients were given the scenario that they are on a boat that is about to become shipwrecked, leaving them stranded on an [place]. They are asked to make a list of 15 different items that they want to take with them when they are stranded on the [place]. Patients are asked to rank their items from most important to least important, #1 being the most important and #15 being the least. Patients will work individually for the first round to come up with 15 items and then pair up with a peer(s) to condense their list and come up with one list of 15 items between the two of them. Patients or LRT will read aloud the 15 different items to the group after each round. LRT facilitated post-activity processing to discuss how this activity can be used in daily life post discharge.   Goal Area(s) Addressed:  Patient will identify priorities, wants and needs. Patient will communicate with LRT and peers. Patient will work collectively as a Administrator, Civil Service. Patient will work on Product manager.   Affect/Mood: Appropriate   Participation Level: Active and Engaged   Participation Quality: Independent   Behavior: Calm and Cooperative   Speech/Thought Process: Coherent   Insight: Good   Judgement: Good   Modes of Intervention: Activity   Patient Response to Interventions:  Attentive, Engaged, Interested , and Receptive   Education Outcome:  Acknowledges education   Clinical Observations/Individualized Feedback: Melanie Bell was active in their participation of session activities and group discussion. Pt identified "bible, my husband, medicine, food, water, crochet, and journal" as some of the items she wants to bring with her on the [place]. Pt interacted well with LRT and peers duration of  session.   Plan: Continue to engage patient in RT group sessions 2-3x/week.   Rosina Lowenstein, LRT, CTRS 11/09/2022 11:24 AM

## 2022-11-09 NOTE — Progress Notes (Signed)
Patient calm and pleasant during assessment denying SI/HI/AVH. Pt endorses depression. Pt observed interacting appropriately with staff and peers on the unit. Pt didn't have any medications scheduled tonight and hasn't requested any PRN medications as of now. Pt being monitored Q 15 minutes for safety per unit protocol, remains safe on the unit  

## 2022-11-09 NOTE — Group Note (Signed)
Date:  11/09/2022 Time:  4:50 PM  Group Topic/Focus:  Activity Group:  Focus of the group is to encourage patients to go outside in the courtyard and get some fresh air to assist with their mental and physical well-being.    Participation Level:  Active  Participation Quality:  Appropriate  Affect:  Appropriate  Cognitive:  Appropriate  Insight: Appropriate  Engagement in Group:  Engaged  Modes of Intervention:  Activity  Additional Comments:    Melanie Bell Melanie Bell 11/09/2022, 4:50 PM

## 2022-11-09 NOTE — Group Note (Signed)
Date:  11/09/2022 Time:  9:28 PM  Group Topic/Focus:  Wrap-Up Group:   The focus of this group is to help patients review their daily goal of treatment and discuss progress on daily workbooks.    Participation Level:  Active  Participation Quality:  Appropriate and Attentive  Affect:  Appropriate  Cognitive:  Alert and Appropriate  Insight: Appropriate  Engagement in Group:  Developing/Improving  Modes of Intervention:  Socialization  Additional Comments:     Melanie Bell 11/09/2022, 9:28 PM

## 2022-11-09 NOTE — Progress Notes (Signed)
   11/09/22 1000  Psych Admission Type (Psych Patients Only)  Admission Status Involuntary  Psychosocial Assessment  Patient Complaints None  Eye Contact Brief  Facial Expression Flat  Affect Flat  Speech Logical/coherent  Interaction Assertive  Motor Activity Slow  Appearance/Hygiene In scrubs  Behavior Characteristics Cooperative;Calm  Mood Pleasant  Thought Process  Coherency WDL  Content WDL  Delusions None reported or observed  Perception WDL  Hallucination None reported or observed  Judgment WDL  Confusion None  Danger to Self  Current suicidal ideation? Denies  Agreement Not to Harm Self Yes  Danger to Others  Danger to Others None reported or observed   Patient Denies SI/HI/AVH. Tolerated all medications and meals today and attended group.

## 2022-11-09 NOTE — H&P (Signed)
Psychiatric Admission Assessment Adult  Patient Identification: Melanie Bell MRN:  [id number] Date of Evaluation:  11/09/2022 Chief Complaint:  Intentional overdose (HCC) [T50.902A] Principal Diagnosis: Intentional overdose (HCC) Diagnosis:  Principal Problem:   Intentional overdose (HCC) Identifying information and reason for admission: Patient is a 32 year old Caucasian female with a prior history of suicidal attempts who was admitted status post an overdose in a suicidal gesture. History of Present Illness: The patient lives with her husband and her stepson in [place], [place].  She gives a history of feeling overwhelmed with multiple health issues both in herself and her husband who has severe diabetes.  They have been having some financial challenges and apparently recently lost the vehicle.  She explains that the series of events leading to her getting extremely distressed and frustrated.  She admits that she took the pills in an overdose attempt impulsively without thinking.  She expresses remorse and regret on her actions.  Although she has a long history of prior suicidal behavior including self injury injurious behavior with multiple prior hospitalizations, this is the first time she actually took an overdose of prescription medications. Patient is followed up by the ACT team and sees Dr. Mervyn Skeeters on a regular basis.  She reports that her husband is supportive.  On examination today: During the evaluation today the patient was alert oriented and cooperative and maintained fair to good eye contact.  She was quite forthcoming with information and reports that she has a long history of chronic mental illness and has significant self-injurious behavior is primarily involved ingesting foreign objects like batteries, fishhooks, safety pins and razor blades.  She was operated at least once to remove these objects from her stomach.  She is on Social Security disability because of her illness  and follows up with the ACT team.  She states that she is not feeling depressed but feels quite distressed by her current situation.  She denies any active SI/HI/AVH and denies any paranoia or psychosis.  She is contracting for safety.  She rates her depression today at a 2/10, anxiety at a 3/10 and suicidal ideations at a 0/10.  She is cognitively intact.  Associated Signs/Symptoms: Depression Symptoms:  depressed mood, psychomotor retardation, suicidal thoughts without plan, suicidal attempt, anxiety, (Hypo) Manic Symptoms:  Distractibility, Impulsivity, Anxiety Symptoms:  Excessive Worry, Psychotic Symptoms:   Denies any. PTSD Symptoms: Negative Total Time spent with patient: 30 minutes  Past Psychiatric History: Patient has an extensive history of self-mutilative behavior.  She is followed by the ACT team.  She is currently compliant with her medications.  She gives a history of multiple prior hospitalizations and the last one was in 2020.  Is the patient at risk to self? Yes.    Has the patient been a risk to self in the past 6 months? Yes.    Has the patient been a risk to self within the distant past? Yes.    Is the patient a risk to others? No.  Has the patient been a risk to others in the past 6 months? No.  Has the patient been a risk to others within the distant past? No.   [place] Scale:  Flowsheet Row Admission (Current) from 11/08/2022 in [facility] INPATIENT BEHAVIORAL MEDICINE ED from 11/07/2022 in [facility] Emergency Department at [facility] ED from 10/24/2022 in [facility] Emergency Department at [facility]  C-SSRS RISK CATEGORY No Risk No Risk No Risk        Prior Inpatient Therapy: Yes.  If yes, describe very documented in the records. Prior Outpatient Therapy: Yes.   If yes, describe patient sees [facility] ACT team  Alcohol Screening: 1. How often do you have a drink containing alcohol?: Never 2. How many drinks containing alcohol do you have on a  typical day when you are drinking?: 1 or 2 3. How often do you have six or more drinks on one occasion?: Never AUDIT-C Score: 0 4. How often during the last year have you found that you were not able to stop drinking once you had started?: Never 5. How often during the last year have you failed to do what was normally expected from you because of drinking?: Never 6. How often during the last year have you needed a first drink in the morning to get yourself going after a heavy drinking session?: Never 7. How often during the last year have you had a feeling of guilt of remorse after drinking?: Never 8. How often during the last year have you been unable to remember what happened the night before because you had been drinking?: Never 9. Have you or someone else been injured as a result of your drinking?: No 10. Has a relative or friend or a doctor or another health worker been concerned about your drinking or suggested you cut down?: No Alcohol Use Disorder Identification Test Final Score (AUDIT): 0 Substance Abuse History in the last 12 months:  No. Consequences of Substance Abuse: Negative Previous Psychotropic Medications: Yes  Psychological Evaluations: Yes  Past Medical History:  Past Medical History:  Diagnosis Date   Allergy    Anxiety    Arthritis    Asthma    Depression    Foreign body aspiration    GERD (gastroesophageal reflux disease)    Hallucinations 09/30/2014   Sizoaffective   Hyperlipidemia    Hypertension    Hypoglycemia    Intentional ingestion of batteries 04/21/2016    2 AAA batteries and 1 thumb tack; intent to hurt herself/notes 04/21/2016   Self-inflicted injury 11/23/2017   Suicidal ideation 01/02/2018   Tardive dyskinesia 10/2014   recent onset   Thyroid disease     Past Surgical History:  Procedure Laterality Date   ABDOMINAL SURGERY     "years ago" to remove foreign objects   APPENDECTOMY     BREAST EXCISIONAL BIOPSY Right 09/03/2007   surgical bx  removed fibroadeoma   BREAST LUMPECTOMY Right    CHOLECYSTECTOMY  02/2022   COLONOSCOPY WITH PROPOFOL N/A 09/10/2015   Procedure: COLONOSCOPY WITH PROPOFOL;  Surgeon: Christena Deem, MD;  Location: [facility] ENDOSCOPY;  Service: Endoscopy;  Laterality: N/A;   ESOPHAGOGASTRODUODENOSCOPY N/A 11/28/2014   Procedure: ESOPHAGOGASTRODUODENOSCOPY (EGD);  Surgeon: Scot Jun, MD;  Location: [facility] ENDOSCOPY;  Service: Endoscopy;  Laterality: N/A;   ESOPHAGOGASTRODUODENOSCOPY N/A 02/21/2016   Procedure: ESOPHAGOGASTRODUODENOSCOPY (EGD);  Surgeon: Napoleon Form, MD;  Location: [facility] ENDOSCOPY;  Service: Endoscopy;  Laterality: N/A;   ESOPHAGOGASTRODUODENOSCOPY N/A 04/21/2016   Procedure: ESOPHAGOGASTRODUODENOSCOPY (EGD);  Surgeon: Iva Boop, MD;  Location[facility] ENDOSCOPY;  Service: Endoscopy;  Laterality: N/A;   ESOPHAGOGASTRODUODENOSCOPY N/A 05/06/2016   Procedure: ESOPHAGOGASTRODUODENOSCOPY (EGD);  Surgeon: Wyline Mood, MD;  Location: [facility] ENDOSCOPY;  Service: Gastroenterology;  Laterality: N/A;   ESOPHAGOGASTRODUODENOSCOPY N/A 06/13/2016   Procedure: ESOPHAGOGASTRODUODENOSCOPY (EGD);  Surgeon: Midge Minium, MD;  Location: [facility] ENDOSCOPY;  Service: Endoscopy;  Laterality: N/A;   ESOPHAGOGASTRODUODENOSCOPY N/A 01/14/2022   Procedure: ESOPHAGOGASTRODUODENOSCOPY (EGD);  Surgeon: Toledo, Boykin Nearing, MD;  Location: [facility] ENDOSCOPY;  Service: Gastroenterology;  Laterality: N/A;   ESOPHAGOGASTRODUODENOSCOPY (EGD) WITH PROPOFOL N/A 02/29/2016   Procedure: ESOPHAGOGASTRODUODENOSCOPY (EGD) WITH PROPOFOL;  Surgeon: Midge Minium, MD;  Location: [facility] ENDOSCOPY;  Service: Endoscopy;  Laterality: N/A;   ESOPHAGOGASTRODUODENOSCOPY (EGD) WITH PROPOFOL N/A 01/15/2018   Procedure: ESOPHAGOGASTRODUODENOSCOPY (EGD) WITH PROPOFOL;  Surgeon: Corbin Ade, MD;  [facility] ENDO [facility];  Service: Endoscopy;  Laterality: N/A;  retrieval forein body   LAPAROSCOPIC LYSIS OF ADHESIONS  02/16/2022   Procedure: LAPAROSCOPIC  LYSIS OF ADHESIONS;  Surgeon: Henrene Dodge, MD;  Location: [facility] ORS;  Service: General;;   LAPAROTOMY N/A 09/12/2015   Procedure: EXPLORATORY LAPAROTOMY;  Surgeon: Lattie Haw, MD;  Location: [facility] ORS;  Service: General;  Laterality: N/A;   SIGMOIDOSCOPY N/A 09/12/2015   Procedure: Daiva Huge;  Surgeon: Lattie Haw, MD;  Location: [facility] ORS;  Service: General;  Laterality: N/A;   WISDOM TOOTH EXTRACTION     Family History:  Family History  Problem Relation Age of Onset   Depression Mother    Hypertension Mother    Sleep apnea Mother    Asthma Mother    COPD Mother    Diabetes Mother    Anxiety disorder Mother    Arthritis Mother    Heart failure Mother    Breast cancer Maternal Aunt        20's   Stroke Maternal Grandmother    Heart attack Maternal Grandfather    Family Psychiatric  History: Positive history for depression and anxiety disorder.  Patient reports family history of schizophrenia. Tobacco Screening:  Social History   Tobacco Use  Smoking Status Former   Packs/day: 0.50   Years: 3.00   Additional pack years: 0.00   Total pack years: 1.50   Types: Cigarettes   Quit date: 08/14/2016   Years since quitting: 6.2   Passive exposure: Past  Smokeless Tobacco Never    BH Tobacco Counseling     Are you interested in Tobacco Cessation Medications?  N/A, patient does not use tobacco products Counseled patient on smoking cessation:  N/A, patient does not use tobacco products Reason Tobacco Screening Not Completed: No value filed.       Social History:  Social History   Substance and Sexual Activity  Alcohol Use No     Social History   Substance and Sexual Activity  Drug Use No    Additional Social History:                           Allergies:   Allergies  Allergen Reactions   Betadine [Povidone Iodine] Other (See Comments)    Reaction:  Unknown    Fish Allergy Hives   Shellfish-Derived Products Other (See Comments)     Reaction:  Unknown    Lab Results:  Results for orders placed or performed during the hospital encounter of 11/07/22 (from the past 48 hour(s))  Comprehensive metabolic panel     Status: None   Collection Time: 11/07/22  5:43 PM  Result Value Ref Range   Sodium 141 135 - 145 mmol/L   Potassium 4.0 3.5 - 5.1 mmol/L   Chloride 106 98 - 111 mmol/L   CO2 28 22 - 32 mmol/L   Glucose, Bld 88 70 - 99 mg/dL    Comment: Glucose reference range applies only to samples taken after fasting for at least 8 hours.   BUN 20 6 - 20 mg/dL   Creatinine, Ser 0.27 0.44 - 1.00  mg/dL   Calcium 9.8 8.9 - 78.2 mg/dL   Total Protein 7.3 6.5 - 8.1 g/dL   Albumin 3.9 3.5 - 5.0 g/dL   AST 19 15 - 41 U/L   ALT 11 0 - 44 U/L   Alkaline Phosphatase 67 38 - 126 U/L   Total Bilirubin 0.6 0.3 - 1.2 mg/dL   GFR, Estimated >95 >62 mL/min    Comment: (NOTE) Calculated using the CKD-EPI Creatinine Equation (2021)    Anion gap 7 5 - 15    Comment: Performed at [facility], [address] [place], [place] [address]  Ethanol     Status: None   Collection Time: 11/07/22  5:43 PM  Result Value Ref Range   Alcohol, Ethyl (B) <10 <10 mg/dL    Comment: (NOTE) Lowest detectable limit for serum alcohol is 10 mg/dL.  For medical purposes only. Performed at [facility], [address] [place], [place] [address]   CBC with Differential     Status: None   Collection Time: 11/07/22  5:43 PM  Result Value Ref Range   WBC 10.2 4.0 - 10.5 K/uL   RBC 4.53 3.87 - 5.11 MIL/uL   Hemoglobin 13.6 12.0 - 15.0 g/dL   HCT 96.2 95.2 - 84.1 %   MCV 94.3 80.0 - 100.0 fL   MCH 30.0 26.0 - 34.0 pg   MCHC 31.9 30.0 - 36.0 g/dL   RDW 32.4 40.1 - 02.7 %   Platelets 208 150 - 400 K/uL   nRBC 0.0 0.0 - 0.2 %   Neutrophils Relative % 72 %   Neutro Abs 7.3 1.7 - 7.7 K/uL   Lymphocytes Relative 19 %   Lymphs Abs 2.0 0.7 - 4.0 K/uL   Monocytes Relative 7 %   Monocytes Absolute 0.7 0.1 - 1.0 K/uL   Eosinophils  Relative 1 %   Eosinophils Absolute 0.1 0.0 - 0.5 K/uL   Basophils Relative 1 %   Basophils Absolute 0.1 0.0 - 0.1 K/uL   Immature Granulocytes 0 %   Abs Immature Granulocytes 0.02 0.00 - 0.07 K/uL    Comment: Performed at [facility], [address] [place], [place] [address]  Acetaminophen level     Status: Abnormal   Collection Time: 11/07/22  5:43 PM  Result Value Ref Range   Acetaminophen (Tylenol), Serum <10 (L) 10 - 30 ug/mL    Comment: (NOTE) Therapeutic concentrations vary significantly. A range of 10-30 ug/mL  may be an effective concentration for many patients. However, some  are best treated at concentrations outside of this range. Acetaminophen concentrations >150 ug/mL at 4 hours after ingestion  and >50 ug/mL at 12 hours after ingestion are often associated with  toxic reactions.  Performed at [facility], [address] [place], [place] [address]   Salicylate level     Status: Abnormal   Collection Time: 11/07/22  5:43 PM  Result Value Ref Range   Salicylate Lvl <7.0 (L) 7.0 - 30.0 mg/dL    Comment: Performed at [facility], [address] [place], [place] [address]  Urine Drug Screen, Qualitative     Status: Abnormal   Collection Time: 11/08/22 12:07 AM  Result Value Ref Range   Tricyclic, Ur Screen POSITIVE (A) NONE DETECTED   Amphetamines, Ur Screen NONE DETECTED NONE DETECTED   MDMA (Ecstasy)Ur Screen NONE DETECTED NONE DETECTED   Cocaine Metabolite,Ur [place] NONE DETECTED NONE DETECTED   Opiate, Ur Screen NONE DETECTED NONE DETECTED  Phencyclidine (PCP) Ur S NONE DETECTED NONE DETECTED   Cannabinoid 50 Ng, Ur [place] NONE DETECTED NONE DETECTED   Barbiturates, Ur Screen NONE DETECTED NONE DETECTED   Benzodiazepine, Ur Scrn NONE DETECTED NONE DETECTED   Methadone Scn, Ur NONE DETECTED NONE DETECTED    Comment: (NOTE) Tricyclics + metabolites, urine    Cutoff 1000 ng/mL Amphetamines + metabolites, urine  Cutoff 1000 ng/mL MDMA  (Ecstasy), urine              Cutoff 500 ng/mL Cocaine Metabolite, urine          Cutoff 300 ng/mL Opiate + metabolites, urine        Cutoff 300 ng/mL Phencyclidine (PCP), urine         Cutoff 25 ng/mL Cannabinoid, urine                 Cutoff 50 ng/mL Barbiturates + metabolites, urine  Cutoff 200 ng/mL Benzodiazepine, urine              Cutoff 200 ng/mL Methadone, urine                   Cutoff 300 ng/mL  The urine drug screen provides only a preliminary, unconfirmed analytical test result and should not be used for non-medical purposes. Clinical consideration and professional judgment should be applied to any positive drug screen result due to possible interfering substances. A more specific alternate chemical method must be used in order to obtain a confirmed analytical result. Gas chromatography / mass spectrometry (GC/MS) is the preferred confirm atory method. Performed at [facility], [address] [place], [place] [address]   Pregnancy, urine     Status: None   Collection Time: 11/08/22 12:07 AM  Result Value Ref Range   Preg Test, Ur NEGATIVE NEGATIVE    Comment:        THE SENSITIVITY OF THIS METHODOLOGY IS >25 mIU/mL. Performed at [facility], [address] [place], [place] [address]     Blood Alcohol level:  Lab Results  Component Value Date   [facility] <10 11/07/2022   ETH <10 01/31/2018    Metabolic Disorder Labs:  Lab Results  Component Value Date   HGBA1C 5.1 11/23/2017   MPG 99.67 11/23/2017   MPG 103 03/03/2016   Lab Results  Component Value Date   PROLACTIN 3.1 05/02/2022   PROLACTIN 17.2 11/01/2021   Lab Results  Component Value Date   CHOL 170 09/30/2021   TRIG 145 09/30/2021   HDL 35 (L) 09/30/2021   CHOLHDL 4.9 (H) 09/30/2021   VLDL 15 11/23/2017   LDLCALC 109 (H) 09/30/2021   LDLCALC 79 07/01/2019    Current Medications: Current Facility-Administered Medications  Medication Dose Route Frequency Provider Last  Rate Last Admin   acetaminophen (TYLENOL) tablet 650 mg  650 mg Oral Q6H PRN Bennett, Christal H, NP   650 mg at 11/09/22 0929   alum & mag hydroxide-simeth (MAALOX/MYLANTA) 200-200-20 MG/5ML suspension 30 mL  30 mL Oral Q4H PRN Bennett, Christal H, NP       ARIPiprazole (ABILIFY) tablet 5 mg  5 mg Oral Daily Bennett, Christal H, NP   5 mg at 11/09/22 0981   diphenhydrAMINE (BENADRYL) capsule 50 mg  50 mg Oral TID PRN Bennett, Christal H, NP       Or   diphenhydrAMINE (BENADRYL) injection 50 mg  50 mg Intramuscular TID PRN Bennett, Christal H, NP       escitalopram (LEXAPRO)  tablet 10 mg  10 mg Oral Daily Bennett, Christal H, NP   10 mg at 11/09/22 1610   haloperidol (HALDOL) tablet 5 mg  5 mg Oral TID PRN Bennett, Christal H, NP       Or   haloperidol lactate (HALDOL) injection 5 mg  5 mg Intramuscular TID PRN Bennett, Christal H, NP       hydrOXYzine (ATARAX) tablet 50 mg  50 mg Oral BID PRN Bennett, Christal H, NP       lithium carbonate capsule 300 mg  300 mg Oral BID WC Bennett, Christal H, NP   300 mg at 11/09/22 0928   LORazepam (ATIVAN) tablet 2 mg  2 mg Oral TID PRN Bennett, Christal H, NP       Or   LORazepam (ATIVAN) injection 2 mg  2 mg Intramuscular TID PRN Bennett, Christal H, NP       magnesium hydroxide (MILK OF MAGNESIA) suspension 30 mL  30 mL Oral Daily PRN Bennett, Christal H, NP   30 mL at 11/09/22 0930   traZODone (DESYREL) tablet 50 mg  50 mg Oral QHS PRN Bennett, Christal H, NP       PTA Medications: Medications Prior to Admission  Medication Sig Dispense Refill Last Dose   albuterol (VENTOLIN HFA) 108 (90 Base) MCG/ACT inhaler 2 puffs four times a day as needed 18 g 1    ARIPiprazole (ABILIFY) 5 MG tablet Take 5 mg by mouth daily.      cabergoline (DOSTINEX) 0.5 MG tablet Take 1 tablet (0.5 mg total) by mouth once a week. 12 tablet 3    cefdinir (OMNICEF) 300 MG capsule Take 1 capsule (300 mg total) by mouth 2 (two) times daily. (Patient not taking: Reported on  11/08/2022) 20 capsule 0    cetirizine (ZYRTEC) 10 MG tablet Take 4 tablets (40 mg total) by mouth daily for 5 days. (Patient not taking: Reported on 09/28/2022) 20 tablet 0    COVID-19 At Home Antigen Test KIT Use as directed. 1 each 0    escitalopram (LEXAPRO) 10 MG tablet Take 10 mg by mouth daily.      fluticasone-salmeterol (ADVAIR HFA) 45-21 MCG/ACT inhaler Inhale 2 puffs into the lungs 2 (two) times daily. (Patient not taking: Reported on 11/08/2022) 1 each 12    hydrOXYzine (VISTARIL) 50 MG capsule Take 50 mg by mouth 2 (two) times daily as needed.      levothyroxine (SYNTHROID) 75 MCG tablet Take 1 tablet (75 mcg total) by mouth daily before breakfast. 90 tablet 3    lithium carbonate 300 MG capsule Take 1 capsule (300 mg total) by mouth 2 (two) times daily with a meal. 60 capsule 1    magnesium oxide (MAG-OX) 400 MG tablet Take 400 mg by mouth daily.      meloxicam (MOBIC) 15 MG tablet Take 1 tablet (15 mg total) by mouth daily. 30 tablet 11    omeprazole (PRILOSEC) 20 MG capsule TAKE 1 CAPSULE (20 MG TOTAL) BY MOUTH DAILY. 90 capsule 2    ondansetron (ZOFRAN-ODT) 4 MG disintegrating tablet Take 1 tablet (4 mg total) by mouth every 8 (eight) hours as needed for nausea or vomiting. 12 tablet 0    prazosin (MINIPRESS) 2 MG capsule Take 1 capsule (2 mg total) by mouth at bedtime. 30 capsule 0    pregabalin (LYRICA) 50 MG capsule Take 50 mg by mouth 2 (two) times daily.      silver sulfADIAZINE (SILVADENE) 1 % cream  Apply 1 Application topically daily. 25 g 0    sucralfate (CARAFATE) 1 g tablet Take 1 tablet (1 g total) by mouth 4 (four) times daily. (Patient not taking: Reported on 11/08/2022) 60 tablet 0    SUMAtriptan (IMITREX) 20 MG/ACT nasal spray Place 20 mg into the nose as needed.      topiramate (TOPAMAX) 25 MG tablet Take 25 mg by mouth 2 (two) times daily.      triamcinolone cream (KENALOG) 0.1 % Apply 1 Application topically 4 (four) times daily. 30 g 0      Musculoskeletal: Strength & Muscle Tone: within normal limits Gait & Station: normal Patient leans: N/A            Psychiatric Specialty Exam:  Presentation  General Appearance:  Disheveled; Casual  Eye Contact: Fair  Speech: Clear and Coherent  Speech Volume: Decreased  Handedness: Right   Mood and Affect  Mood: Anxious  Affect: Constricted   Thought Process  Thought Processes: Coherent  Duration of Psychotic Symptoms:N/A Past Diagnosis of Schizophrenia or Psychoactive disorder: No  Descriptions of Associations:Intact  Orientation:Full (Time, Place and Person)  Thought Content:Rumination; Perseveration  Hallucinations:Hallucinations: None  Ideas of Reference:None  Suicidal Thoughts:Suicidal Thoughts: Yes, Passive SI Passive Intent and/or Plan: Without Intent; Without Plan  Homicidal Thoughts:Homicidal Thoughts: No   Sensorium  Memory: Immediate Fair; Remote Fair; Recent Fair  Judgment: Poor  Insight: Fair   Chartered certified accountant: Fair  Attention Span: Fair  Recall: [employer] of Knowledge: Fair  Language: Fair   Psychomotor Activity  Psychomotor Activity: Psychomotor Activity: Normal   Assets  Assets: Manufacturing systems engineer; Desire for Improvement; Housing   Sleep  Sleep: Sleep: Fair    Physical Exam: Physical Exam Constitutional:      Appearance: Normal appearance. She is obese.  Neurological:     Mental Status: She is alert.  Psychiatric:        Mood and Affect: Mood normal.        Behavior: Behavior normal.    Review of Systems  Psychiatric/Behavioral:  Positive for depression and suicidal ideas.   All other systems reviewed and are negative.  Blood pressure 118/74, pulse 76, temperature 98.7 F (37.1 C), temperature source Oral, resp. rate 18, height 5\' 7"  (1.702 m), weight 125.2 kg, SpO2 100 %, unknown if currently breastfeeding. Body mass index is 43.23  kg/m.  Treatment Plan Summary: Daily contact with patient to assess and evaluate symptoms and progress in treatment and Medication management  Observation Level/Precautions:  15 minute checks  Laboratory:  CBC Chemistry Profile HbAIC  Psychotherapy: Individual and group therapy.  Medications: Restart home medications.  Consultations: None  Discharge Concerns: Suicidal ideations  Estimated LOS:.  5 to 7 days.  Other:     Physician Treatment Plan for Primary Diagnosis: Intentional overdose (HCC) Long Term Goal(s): Improvement in symptoms so as ready for discharge  Short Term Goals: Ability to verbalize feelings will improve, Ability to disclose and discuss suicidal ideas, and Ability to demonstrate self-control will improve  Physician Treatment Plan for Secondary Diagnosis: Principal Problem:   Intentional overdose (HCC)  Long Term Goal(s): Improvement in symptoms so as ready for discharge  Short Term Goals: Ability to demonstrate self-control will improve, Ability to identify and develop effective coping behaviors will improve, and Compliance with prescribed medications will improve  I certify that inpatient services furnished can reasonably be expected to improve the patient's condition.    Rex Kras, MD 6/26/202412:30 PM

## 2022-11-09 NOTE — [facility] Suicide Risk Assessment (Signed)
[facility] Admission Suicide Risk Assessment   Nursing information obtained from:  Patient Demographic factors:  Caucasian Current Mental Status:  NA Loss Factors:  NA Historical Factors:  NA Risk Reduction Factors:  Sense of responsibility to family, Responsible for children under 33 years of age  Total Time spent with patient: 30 minutes Principal Problem: Intentional overdose (HCC) Diagnosis:  Principal Problem:   Intentional overdose (HCC)  Subjective Data: 32 year old female was admitted with depression and suicidal ideations.  She is status post an overdose of Vistaril in a suicide attempt.  Continued Clinical Symptoms:  Alcohol Use Disorder Identification Test Final Score (AUDIT): 0 The "Alcohol Use Disorders Identification Test", Guidelines for Use in Primary Care, Second Edition.  [employer] [facility]). Score between 0-7:  no or low risk or alcohol related problems. Score between 8-15:  moderate risk of alcohol related problems. Score between 16-19:  high risk of alcohol related problems. Score 20 or above:  warrants further diagnostic evaluation for alcohol dependence and treatment.   CLINICAL FACTORS:   Depression:   Impulsivity Personality Disorders:   Cluster B More than one psychiatric diagnosis Previous Psychiatric Diagnoses and Treatments   Musculoskeletal: Strength & Muscle Tone: within normal limits Gait & Station: normal Patient leans: N/A  Psychiatric Specialty Exam:  Presentation  General Appearance: No data recorded Eye Contact:No data recorded Speech:No data recorded Speech Volume:No data recorded Handedness:No data recorded  Mood and Affect  Mood:No data recorded Affect:No data recorded  Thought Process  Thought Processes:No data recorded Descriptions of Associations:No data recorded Orientation:No data recorded Thought Content:No data recorded History of Schizophrenia/Schizoaffective disorder:No  Duration of Psychotic Symptoms:No  data recorded Hallucinations:No data recorded Ideas of Reference:No data recorded Suicidal Thoughts:No data recorded Homicidal Thoughts:No data recorded  Sensorium  Memory:No data recorded Judgment:No data recorded Insight:No data recorded  Executive Functions  Concentration:No data recorded Attention Span:No data recorded Recall:No data recorded Fund of Knowledge:No data recorded Language:No data recorded  Psychomotor Activity  Psychomotor Activity:No data recorded  Assets  Assets:No data recorded  Sleep  Sleep:No data recorded   Physical Exam: Physical Exam Constitutional:      Appearance: Normal appearance. She is obese.  Neurological:     General: No focal deficit present.     Mental Status: She is alert and oriented to person, place, and time. Mental status is at baseline.    Review of Systems  Psychiatric/Behavioral:  Positive for depression and suicidal ideas. The patient is nervous/anxious.   All other systems reviewed and are negative.  Blood pressure 118/74, pulse 76, temperature 98.7 F (37.1 C), temperature source Oral, resp. rate 18, height 5\' 7"  (1.702 m), weight 125.2 kg, SpO2 100 %, unknown if currently breastfeeding. Body mass index is 43.23 kg/m.   COGNITIVE FEATURES THAT CONTRIBUTE TO RISK:  Thought constriction (tunnel vision)    SUICIDE RISK:   Moderate:  Frequent suicidal ideation with limited intensity, and duration, some specificity in terms of plans, no associated intent, good self-control, limited dysphoria/symptomatology, some risk factors present, and identifiable protective factors, including available and accessible social support.  PLAN OF CARE: The patient is admitted to the safe and secure environment for further observation and treatment as indicated.  Additional information please see the admission assessment.  Patient is IVC and this has been upheld.  I certify that inpatient services furnished can reasonably be expected to  improve the patient's condition.   Rex Kras, MD 11/09/2022, 12:26 PM

## 2022-11-09 NOTE — BH IP Treatment Plan (Addendum)
Interdisciplinary Treatment and Diagnostic Plan Update  11/09/2022 Time of Session: 8:58 AM  Nesa Distel MRN: [id number]  Principal Diagnosis: Intentional overdose [facility])  Secondary Diagnoses: Principal Problem:   Intentional overdose (HCC)   Current Medications:  Current Facility-Administered Medications  Medication Dose Route Frequency Provider Last Rate Last Admin   acetaminophen (TYLENOL) tablet 650 mg  650 mg Oral Q6H PRN Willeen Cass, Christal H, NP   650 mg at 11/09/22 0929   alum & mag hydroxide-simeth (MAALOX/MYLANTA) 200-200-20 MG/5ML suspension 30 mL  30 mL Oral Q4H PRN Bennett, Christal H, NP       ARIPiprazole (ABILIFY) tablet 5 mg  5 mg Oral Daily Bennett, Christal H, NP   5 mg at 11/09/22 1478   diphenhydrAMINE (BENADRYL) capsule 50 mg  50 mg Oral TID PRN Bennett, Christal H, NP       Or   diphenhydrAMINE (BENADRYL) injection 50 mg  50 mg Intramuscular TID PRN Bennett, Christal H, NP       escitalopram (LEXAPRO) tablet 10 mg  10 mg Oral Daily Bennett, Christal H, NP   10 mg at 11/09/22 2956   haloperidol (HALDOL) tablet 5 mg  5 mg Oral TID PRN Bennett, Christal H, NP       Or   haloperidol lactate (HALDOL) injection 5 mg  5 mg Intramuscular TID PRN Bennett, Christal H, NP       hydrOXYzine (ATARAX) tablet 50 mg  50 mg Oral BID PRN Bennett, Christal H, NP       lithium carbonate capsule 300 mg  300 mg Oral BID WC Bennett, Christal H, NP   300 mg at 11/09/22 0928   LORazepam (ATIVAN) tablet 2 mg  2 mg Oral TID PRN Bennett, Christal H, NP       Or   LORazepam (ATIVAN) injection 2 mg  2 mg Intramuscular TID PRN Bennett, Christal H, NP       magnesium hydroxide (MILK OF MAGNESIA) suspension 30 mL  30 mL Oral Daily PRN Bennett, Christal H, NP   30 mL at 11/09/22 0930   traZODone (DESYREL) tablet 50 mg  50 mg Oral QHS PRN Bennett, Christal H, NP       PTA Medications: Medications Prior to Admission  Medication Sig Dispense Refill Last Dose   albuterol (VENTOLIN HFA) 108  (90 Base) MCG/ACT inhaler 2 puffs four times a day as needed 18 g 1    ARIPiprazole (ABILIFY) 5 MG tablet Take 5 mg by mouth daily.      cabergoline (DOSTINEX) 0.5 MG tablet Take 1 tablet (0.5 mg total) by mouth once a week. 12 tablet 3    cefdinir (OMNICEF) 300 MG capsule Take 1 capsule (300 mg total) by mouth 2 (two) times daily. (Patient not taking: Reported on 11/08/2022) 20 capsule 0    cetirizine (ZYRTEC) 10 MG tablet Take 4 tablets (40 mg total) by mouth daily for 5 days. (Patient not taking: Reported on 09/28/2022) 20 tablet 0    COVID-19 At Home Antigen Test KIT Use as directed. 1 each 0    escitalopram (LEXAPRO) 10 MG tablet Take 10 mg by mouth daily.      fluticasone-salmeterol (ADVAIR HFA) 45-21 MCG/ACT inhaler Inhale 2 puffs into the lungs 2 (two) times daily. (Patient not taking: Reported on 11/08/2022) 1 each 12    hydrOXYzine (VISTARIL) 50 MG capsule Take 50 mg by mouth 2 (two) times daily as needed.      levothyroxine (SYNTHROID) 75 MCG tablet Take  1 tablet (75 mcg total) by mouth daily before breakfast. 90 tablet 3    lithium carbonate 300 MG capsule Take 1 capsule (300 mg total) by mouth 2 (two) times daily with a meal. 60 capsule 1    magnesium oxide (MAG-OX) 400 MG tablet Take 400 mg by mouth daily.      meloxicam (MOBIC) 15 MG tablet Take 1 tablet (15 mg total) by mouth daily. 30 tablet 11    omeprazole (PRILOSEC) 20 MG capsule TAKE 1 CAPSULE (20 MG TOTAL) BY MOUTH DAILY. 90 capsule 2    ondansetron (ZOFRAN-ODT) 4 MG disintegrating tablet Take 1 tablet (4 mg total) by mouth every 8 (eight) hours as needed for nausea or vomiting. 12 tablet 0    prazosin (MINIPRESS) 2 MG capsule Take 1 capsule (2 mg total) by mouth at bedtime. 30 capsule 0    pregabalin (LYRICA) 50 MG capsule Take 50 mg by mouth 2 (two) times daily.      silver sulfADIAZINE (SILVADENE) 1 % cream Apply 1 Application topically daily. 25 g 0    sucralfate (CARAFATE) 1 g tablet Take 1 tablet (1 g total) by mouth 4  (four) times daily. (Patient not taking: Reported on 11/08/2022) 60 tablet 0    SUMAtriptan (IMITREX) 20 MG/ACT nasal spray Place 20 mg into the nose as needed.      topiramate (TOPAMAX) 25 MG tablet Take 25 mg by mouth 2 (two) times daily.      triamcinolone cream (KENALOG) 0.1 % Apply 1 Application topically 4 (four) times daily. 30 g 0     Patient Stressors: Health problems   Marital or family conflict    Patient Strengths: Forensic psychologist fund of knowledge  Motivation for treatment/growth  Supportive family/friends   Treatment Modalities: Medication Management, Group therapy, Case management,  1 to 1 session with clinician, Psychoeducation, Recreational therapy.   Physician Treatment Plan for Primary Diagnosis: Intentional overdose (HCC) Long Term Goal(s):     Short Term Goals:    Medication Management: Evaluate patient's response, side effects, and tolerance of medication regimen.  Therapeutic Interventions: 1 to 1 sessions, Unit Group sessions and Medication administration.  Evaluation of Outcomes: Not Met  Physician Treatment Plan for Secondary Diagnosis: Principal Problem:   Intentional overdose (HCC)  Long Term Goal(s):     Short Term Goals:       Medication Management: Evaluate patient's response, side effects, and tolerance of medication regimen.  Therapeutic Interventions: 1 to 1 sessions, Unit Group sessions and Medication administration.  Evaluation of Outcomes: Not Met   RN Treatment Plan for Primary Diagnosis: Intentional overdose (HCC) Long Term Goal(s): Knowledge of disease and therapeutic regimen to maintain health will improve  Short Term Goals: Ability to remain free from injury will improve, Ability to verbalize frustration and anger appropriately will improve, Ability to demonstrate self-control, Ability to participate in decision making will improve, Ability to verbalize feelings will improve, Ability to disclose and discuss suicidal  ideas, Ability to identify and develop effective coping behaviors will improve, and Compliance with prescribed medications will improve  Medication Management: RN will administer medications as ordered by provider, will assess and evaluate patient's response and provide education to patient for prescribed medication. RN will report any adverse and/or side effects to prescribing provider.  Therapeutic Interventions: 1 on 1 counseling sessions, Psychoeducation, Medication administration, Evaluate responses to treatment, Monitor vital signs and CBGs as ordered, Perform/monitor CIWA, COWS, AIMS and Fall Risk screenings as ordered, Perform wound care  treatments as ordered.  Evaluation of Outcomes: Not Met   LCSW Treatment Plan for Primary Diagnosis: Intentional overdose (HCC) Long Term Goal(s): Safe transition to appropriate next level of care at discharge, Engage patient in therapeutic group addressing interpersonal concerns.  Short Term Goals: Engage patient in aftercare planning with referrals and resources, Increase social support, Increase ability to appropriately verbalize feelings, Increase emotional regulation, Facilitate acceptance of mental health diagnosis and concerns, Identify triggers associated with mental health/substance abuse issues, and Increase skills for wellness and recovery  Therapeutic Interventions: Assess for all discharge needs, 1 to 1 time with Social worker, Explore available resources and support systems, Assess for adequacy in community support network, Educate family and significant other(s) on suicide prevention, Complete Psychosocial Assessment, Interpersonal group therapy.  Evaluation of Outcomes: Not Met   Progress in Treatment: Attending groups: No. Participating in groups: No. Taking medication as prescribed: Yes. Toleration medication: Yes. Family/Significant other contact made: No, will contact:  CSW will contact if pt allows  Patient understands diagnosis:  Yes. Discussing patient identified problems/goals with staff: Yes. Medical problems stabilized or resolved: Yes. Denies suicidal/homicidal ideation: Yes. Issues/concerns per patient self-inventory: No. Other: None   New problem(s) identified: No, Describe:  None  New Short Term/Long Term Goal(s): elimination of symptoms of psychosis, medication management for mood stabilization; elimination of SI thoughts; development of comprehensive mental wellness plan.   Patient Goals:  " Basically I want to get treatment so I know how to deal with things when I'm frustrated or overwhelmed"   Discharge Plan or Barriers: CSW will assist with appropriate discharge planning   Reason for Continuation of Hospitalization: Anxiety Depression Medication stabilization  Estimated Length of Stay: 1 to 7 days   Last 3 [place] Suicide Severity Risk Score: Flowsheet Row Admission (Current) from 11/08/2022 in [facility] INPATIENT [facility]ICINE ED from 11/07/2022 in [facility] Emergency Department at [facility] [address] ED from 10/24/2022 in [facility] Emergency Department at [facility]  C-SSRS RISK CATEGORY No Risk No Risk No Risk       Last PHQ 2/9 Scores:    06/20/2022    1:51 PM 01/12/2022    3:00 PM 01/05/2022    3:42 PM  Depression screen PHQ 2/9  Decreased Interest 0 0 0  Down, Depressed, Hopeless 0 0 0  PHQ - 2 Score 0 0 0  Altered sleeping 1 1 0  Tired, decreased energy 0 1 0  Change in appetite 1 0 0  Feeling bad or failure about yourself  0 0 0  Trouble concentrating 0 0 0  Moving slowly or fidgety/restless 0 0 0  Suicidal thoughts 0 0 0  PHQ-9 Score 2 2 0  Difficult doing work/chores Not difficult at all Not difficult at all Not difficult at all    Scribe for Treatment Team: Elza Rafter, [facility] 11/09/2022 12:06 PM

## 2022-11-10 ENCOUNTER — Ambulatory Visit: Payer: Medicaid Other

## 2022-11-10 DIAGNOSIS — F332 Major depressive disorder, recurrent severe without psychotic features: Secondary | ICD-10-CM

## 2022-11-10 MED ORDER — TOPIRAMATE 25 MG PO TABS
25.0000 mg | ORAL_TABLET | ORAL | Status: DC
  Administered 2022-11-10 – 2022-11-14 (×9): 25 mg via ORAL
  Filled 2022-11-10 (×9): qty 1

## 2022-11-10 NOTE — Progress Notes (Signed)
[facility] MD Progress Note  11/10/2022 12:28 PM Melanie Bell  MRN:  [id number] Subjective: Melanie Bell is seen on rounds.  She states that she is doing better since restarting her medications.  She says that her Topamax has not been restarted.  She takes this for migraine headaches.  She is currently on 25 mg twice a day.  I told her I would go ahead and restart it. Principal Problem: Intentional overdose (HCC) Diagnosis: Principal Problem:   Intentional overdose (HCC)  Total Time spent with patient: 15 minutes  Past Psychiatric History: Depression  Past Medical History:  Past Medical History:  Diagnosis Date   Allergy    Anxiety    Arthritis    Asthma    Depression    Foreign body aspiration    GERD (gastroesophageal reflux disease)    Hallucinations 09/30/2014   Sizoaffective   Hyperlipidemia    Hypertension    Hypoglycemia    Intentional ingestion of batteries 04/21/2016    2 AAA batteries and 1 thumb tack; intent to hurt herself/notes 04/21/2016   Self-inflicted injury 11/23/2017   Suicidal ideation 01/02/2018   Tardive dyskinesia 10/2014   recent onset   Thyroid disease     Past Surgical History:  Procedure Laterality Date   ABDOMINAL SURGERY     "years ago" to remove foreign objects   APPENDECTOMY     BREAST EXCISIONAL BIOPSY Right 09/03/2007   surgical bx removed fibroadeoma   BREAST LUMPECTOMY Right    CHOLECYSTECTOMY  02/2022   COLONOSCOPY WITH PROPOFOL N/A 09/10/2015   Procedure: COLONOSCOPY WITH PROPOFOL;  Surgeon: Christena Deem, MD;  Location: [facility] ENDOSCOPY;  Service: Endoscopy;  Laterality: N/A;   ESOPHAGOGASTRODUODENOSCOPY N/A 11/28/2014   Procedure: ESOPHAGOGASTRODUODENOSCOPY (EGD);  Surgeon: Scot Jun, MD;  Location: [facility] ENDOSCOPY;  Service: Endoscopy;  Laterality: N/A;   ESOPHAGOGASTRODUODENOSCOPY N/A 02/21/2016   Procedure: ESOPHAGOGASTRODUODENOSCOPY (EGD);  Surgeon: Napoleon Form, MD;  Location: [facility] ENDOSCOPY;  Service: Endoscopy;   Laterality: N/A;   ESOPHAGOGASTRODUODENOSCOPY N/A 04/21/2016   Procedure: ESOPHAGOGASTRODUODENOSCOPY (EGD);  Surgeon: Iva Boop, MD;  Location: [facility] ENDOSCOPY;  Service: Endoscopy;  Laterality: N/A;   ESOPHAGOGASTRODUODENOSCOPY N/A 05/06/2016   Procedure: ESOPHAGOGASTRODUODENOSCOPY (EGD);  Surgeon: Wyline Mood, MD;  Location: [facility] ENDOSCOPY;  Service: Gastroenterology;  Laterality: N/A;   ESOPHAGOGASTRODUODENOSCOPY N/A 06/13/2016   Procedure: ESOPHAGOGASTRODUODENOSCOPY (EGD);  Surgeon: Midge Minium, MD;  Location: [facility] ENDOSCOPY;  Service: Endoscopy;  Laterality: N/A;   ESOPHAGOGASTRODUODENOSCOPY N/A 01/14/2022   Procedure: ESOPHAGOGASTRODUODENOSCOPY (EGD);  Surgeon: Toledo, Boykin Nearing, MD;  Location: [facility] ENDOSCOPY;  Service: Gastroenterology;  Laterality: N/A;   ESOPHAGOGASTRODUODENOSCOPY (EGD) WITH PROPOFOL N/A 02/29/2016   Procedure: ESOPHAGOGASTRODUODENOSCOPY (EGD) WITH PROPOFOL;  Surgeon: Midge Minium, MD;  Location: [facility] ENDOSCOPY;  Service: Endoscopy;  Laterality: N/A;   ESOPHAGOGASTRODUODENOSCOPY (EGD) WITH PROPOFOL N/A 01/15/2018   Procedure: ESOPHAGOGASTRODUODENOSCOPY (EGD) WITH PROPOFOL;  Surgeon: Corbin Ade, MD;  Location: [facility] ENDO SUITE;  Service: Endoscopy;  Laterality: N/A;  retrieval forein body   LAPAROSCOPIC LYSIS OF ADHESIONS  02/16/2022   Procedure: LAPAROSCOPIC LYSIS OF ADHESIONS;  Surgeon: Henrene Dodge, MD;  Location: [facility] ORS;  Service: General;;   LAPAROTOMY N/A 09/12/2015   Procedure: EXPLORATORY LAPAROTOMY;  Surgeon: Lattie Haw, MD;  Location: [facility] ORS;  Service: General;  Laterality: N/A;   SIGMOIDOSCOPY N/A 09/12/2015   Procedure: Daiva Huge;  Surgeon: Lattie Haw, MD;  Location: [facility] ORS;  Service: General;  Laterality: N/A;   WISDOM TOOTH EXTRACTION     Family History:  Family  History  Problem Relation Age of Onset   Depression Mother    Hypertension Mother    Sleep apnea Mother    Asthma Mother    COPD Mother    Diabetes Mother     Anxiety disorder Mother    Arthritis Mother    Heart failure Mother    Breast cancer Maternal Aunt        20's   Stroke Maternal Grandmother    Heart attack Maternal Grandfather    Family Psychiatric  History: Unremarkable Social History:  Social History   Substance and Sexual Activity  Alcohol Use No     Social History   Substance and Sexual Activity  Drug Use No    Social History   Socioeconomic History   Marital status: Married    Spouse name: Not on file   Number of children: 0   Years of education: 12   Highest education level: Not on file  Occupational History   Occupation: unemployed  Tobacco Use   Smoking status: Former    Packs/day: 0.50    Years: 3.00    Additional pack years: 0.00    Total pack years: 1.50    Types: Cigarettes    Quit date: 08/14/2016    Years since quitting: 6.2    Passive exposure: Past   Smokeless tobacco: Never  Vaping Use   Vaping Use: Never used  Substance and Sexual Activity   Alcohol use: No   Drug use: No   Sexual activity: Yes    Partners: Male    Birth control/protection: None  Other Topics Concern   Not on file  Social History Narrative   Right handed    One story home   Drinks occasional caffeine   Social Determinants of Health   Financial Resource Strain: Low Risk  (07/08/2022)   Overall Financial Resource Strain (CARDIA)    Difficulty of Paying Living Expenses: Not hard at all  Food Insecurity: No Food Insecurity (11/08/2022)   Hunger Vital Sign    Worried About Running Out of Food in the Last Year: Never true    Ran Out of Food in the Last Year: Never true  Transportation Needs: No Transportation Needs (11/08/2022)   PRAPARE - Administrator, Civil Service (Medical): No    Lack of Transportation (Non-Medical): No  Physical Activity: Insufficiently Active (01/05/2022)   Exercise Vital Sign    Days of Exercise per Week: 2 days    Minutes of Exercise per Session: 60 min  Stress: No Stress Concern  Present (08/22/2022)   [employer] of Occupational Health - Occupational Stress Questionnaire    Feeling of Stress : Only a little  Social Connections: Moderately Integrated (09/21/2022)   Social Connection and Isolation Panel [NHANES]    Frequency of Communication with Friends and Family: More than three times a week    Frequency of Social Gatherings with Friends and Family: More than three times a week    Attends Religious Services: More than 4 times per year    Active Member of [employer] or Organizations: No    Attends Banker Meetings: Never    Marital Status: Married   Additional Social History:                         Sleep: Good  Appetite:  Good  Current Medications: Current Facility-Administered Medications  Medication Dose Route Frequency Provider Last Rate Last Admin   acetaminophen (TYLENOL)  tablet 650 mg  650 mg Oral Q6H PRN Bennett, Christal H, NP   650 mg at 11/10/22 1053   albuterol (VENTOLIN HFA) 108 (90 Base) MCG/ACT inhaler 2 puff  2 puff Inhalation QID PRN Rex Kras, MD       alum & mag hydroxide-simeth (MAALOX/MYLANTA) 200-200-20 MG/5ML suspension 30 mL  30 mL Oral Q4H PRN Bennett, Christal H, NP   30 mL at 11/09/22 1306   ARIPiprazole (ABILIFY) tablet 5 mg  5 mg Oral Daily Bennett, Christal H, NP   5 mg at 11/10/22 0854   [START ON 11/14/2022] cabergoline (DOSTINEX) tablet 0.5 mg  0.5 mg Oral Weekly Rex Kras, MD       diphenhydrAMINE (BENADRYL) capsule 50 mg  50 mg Oral TID PRN Bennett, Christal H, NP       Or   diphenhydrAMINE (BENADRYL) injection 50 mg  50 mg Intramuscular TID PRN Bennett, Christal H, NP       escitalopram (LEXAPRO) tablet 10 mg  10 mg Oral Daily Bennett, Christal H, NP   10 mg at 11/10/22 0855   haloperidol (HALDOL) tablet 5 mg  5 mg Oral TID PRN Bennett, Christal H, NP       Or   haloperidol lactate (HALDOL) injection 5 mg  5 mg Intramuscular TID PRN Bennett, Christal H, NP       hydrOXYzine (ATARAX) tablet  50 mg  50 mg Oral BID PRN Bennett, Christal H, NP       levothyroxine (SYNTHROID) tablet 75 mcg  75 mcg Oral Q0600 Rex Kras, MD   75 mcg at 11/10/22 4098   lithium carbonate capsule 300 mg  300 mg Oral BID WC Bennett, Christal H, NP   300 mg at 11/10/22 0855   LORazepam (ATIVAN) tablet 2 mg  2 mg Oral TID PRN Bennett, Christal H, NP       Or   LORazepam (ATIVAN) injection 2 mg  2 mg Intramuscular TID PRN Bennett, Christal H, NP       magnesium hydroxide (MILK OF MAGNESIA) suspension 30 mL  30 mL Oral Daily PRN Bennett, Christal H, NP   30 mL at 11/09/22 0930   magnesium oxide (MAG-OX) tablet 400 mg  400 mg Oral Daily Rex Kras, MD   400 mg at 11/10/22 0853   meloxicam (MOBIC) tablet 15 mg  15 mg Oral Daily Rex Kras, MD   15 mg at 11/10/22 0854   ondansetron (ZOFRAN-ODT) disintegrating tablet 4 mg  4 mg Oral Q8H PRN Rex Kras, MD       pantoprazole (PROTONIX) EC tablet 40 mg  40 mg Oral Daily Rex Kras, MD   40 mg at 11/10/22 0854   prazosin (MINIPRESS) capsule 2 mg  2 mg Oral QHS Rex Kras, MD   2 mg at 11/09/22 2102   pregabalin (LYRICA) capsule 50 mg  50 mg Oral BID Rex Kras, MD   50 mg at 11/10/22 0855   topiramate (TOPAMAX) tablet 25 mg  25 mg Oral [id number] Domingo Fuson Edward, DO       traZODone (DESYREL) tablet 50 mg  50 mg Oral QHS PRN Willeen Cass, Christal H, NP   50 mg at 11/09/22 2102    Lab Results: No results found for this or any previous visit (from the past 48 hour(s)).  Blood Alcohol level:  Lab Results  Component Value Date   [facility] <10 11/07/2022   ETH <10 01/31/2018    Metabolic Disorder Labs: Lab Results  Component  Value Date   HGBA1C 5.1 11/23/2017   MPG 99.67 11/23/2017   MPG 103 03/03/2016   Lab Results  Component Value Date   PROLACTIN 3.1 05/02/2022   PROLACTIN 17.2 11/01/2021   Lab Results  Component Value Date   CHOL 170 09/30/2021   TRIG 145 09/30/2021   HDL 35 (L) 09/30/2021   CHOLHDL 4.9 (H)  09/30/2021   VLDL 15 11/23/2017   LDLCALC 109 (H) 09/30/2021   LDLCALC 79 07/01/2019    Physical Findings: AIMS:  , ,  ,  ,    CIWA:    COWS:     Musculoskeletal: Strength & Muscle Tone: within normal limits Gait & Station: normal Patient leans: N/A  Psychiatric Specialty Exam:  Presentation  General Appearance:  Disheveled; Casual  Eye Contact: Fair  Speech: Clear and Coherent  Speech Volume: Decreased  Handedness: Right   Mood and Affect  Mood: Anxious  Affect: Constricted   Thought Process  Thought Processes: Coherent  Descriptions of Associations:Intact  Orientation:Full (Time, Place and Person)  Thought Content:Rumination; Perseveration  History of Schizophrenia/Schizoaffective disorder:No  Duration of Psychotic Symptoms:No data recorded Hallucinations:Hallucinations: None  Ideas of Reference:None  Suicidal Thoughts:Suicidal Thoughts: Yes, Passive SI Passive Intent and/or Plan: Without Intent; Without Plan  Homicidal Thoughts:Homicidal Thoughts: No   Sensorium  Memory: Immediate Fair; Remote Fair; Recent Fair  Judgment: Poor  Insight: Fair   Chartered certified accountant: Fair  Attention Span: Fair  Recall: [employer] of Knowledge: Fair  Language: Fair   Psychomotor Activity  Psychomotor Activity: Psychomotor Activity: Normal   Assets  Assets: Manufacturing systems engineer; Desire for Improvement; Housing   Sleep  Sleep: Sleep: Fair     Blood pressure 115/64, pulse 80, temperature 98.1 F (36.7 C), temperature source Oral, resp. rate 18, height 5\' 7"  (1.702 m), weight 125.2 kg, SpO2 97 %, unknown if currently breastfeeding. Body mass index is 43.23 kg/m.   Treatment Plan Summary: Daily contact with patient to assess and evaluate symptoms and progress in treatment, Medication management, and Plan continue current medications.  Restart Topamax 25 mg twice a day.  [name],  DO 11/10/2022, 12:28 PM

## 2022-11-10 NOTE — Plan of Care (Signed)
  Problem: Education: Goal: Knowledge of General Education information will improve Description: Including pain rating scale, medication(s)/side effects and non-pharmacologic comfort measures Outcome: Progressing   Problem: Safety: Goal: Periods of time without injury will increase Outcome: Progressing   Problem: Coping: Goal: Coping ability will improve Outcome: Progressing

## 2022-11-10 NOTE — Progress Notes (Signed)
Patient calm and pleasant during assessment denying SI/HI/AVH. Pt endorses depression. Pt observed interacting appropriately with staff and peers on the unit. Pt didn't have any medications scheduled tonight and hasn't requested any PRN medications as of now. Pt being monitored Q 15 minutes for safety per unit protocol, remains safe on the unit

## 2022-11-10 NOTE — Group Note (Signed)
Date:  11/10/2022 Time:  10:37 PM  Group Topic/Focus:  Healthy Communication:   The focus of this group is to discuss communication, barriers to communication, as well as healthy ways to communicate with others.    Participation Level:  Active  Participation Quality:  Appropriate  Affect:  Appropriate  Cognitive:  Alert and Appropriate  Insight: Good  Engagement in Group:  Developing/Improving  Modes of Intervention:  Socialization and Support  Additional Comments:     Zamire Whitehurst 11/10/2022, 10:37 PM

## 2022-11-10 NOTE — Group Note (Signed)
Recreation Therapy Group Note   Group Topic:Healthy Support Systems  Group Date: 11/10/2022 Start Time: 1000 End Time: 1055 Facilitators: Rosina Lowenstein, LRT Location:  Craft Room  Group Description: Straw Bridge.  Patients were given 10 plastic drinking straws and an equal length of masking tape. Using the materials provided, patients were instructed to build a free-standing bridge-like structure to suspend an everyday item (ex: deck of cards) off the floor or table surface. All materials were required to be used in Secondary school teacher. LRT facilitated post-activity discussion reviewing the importance of having strong and healthy support systems in our lives. LRT discussed how the people in our lives serve as the tape and the deck of cards we placed on top of our straw structure are the stressors we face in daily life. LRT and pts discussed what happens in our life when things get too heavy for [name], and we don't have strong supports outside of the hospital. Pt shared 2 of their healthy supports aloud in the group.   Goal Area(s) Addressed:  Patient will identify 2 healthy supports in their life. Patient will identify skills to successfully complete activity. Patient will identify correlation of this activity to life post-discharge.  Patient will work on Product manager.   Affect/Mood: Appropriate   Participation Level: Engaged   Participation Quality: Independent   Behavior: Calm and Cooperative   Speech/Thought Process: Coherent   Insight: Good   Judgement: Good   Modes of Intervention: Activity   Patient Response to Interventions:  Attentive, Engaged, Interested , and Receptive   Education Outcome:  Acknowledges education   Clinical Observations/Individualized Feedback: Jalexis was active in their participation of session activities and group discussion. Pt identified "my husband and mom" as her healthy support system. Pt came late to group after meeting with social  work. Pt did not complete activity, however she was there for post activity discussion. Pt interacted well with LRT and peers while she was present in group.  Plan: Continue to engage patient in RT group sessions 2-3x/week.   Rosina Lowenstein, LRT, CTRS 11/10/2022 11:20 AM

## 2022-11-10 NOTE — Progress Notes (Deleted)
    11/10/22 0854  Psych Admission Type (Psych Patients Only)  Admission Status Involuntary  Psychosocial Assessment  Patient Complaints None  Eye Contact Brief  Facial Expression Flat  Affect Sullen  Speech Logical/coherent  Interaction Assertive  Motor Activity Slow  Appearance/Hygiene Unremarkable  Behavior Characteristics Cooperative  Mood Pleasant  Thought Process  Coherency WDL  Content WDL  Delusions None reported or observed  Perception WDL  Hallucination None reported or observed;UTA  Judgment WDL  Confusion None  Danger to Self  Current suicidal ideation? Denies  Agreement Not to Harm Self Yes  Description of Agreement Verbal  Danger to Others  Danger to Others None reported or observed    Atient with statement of feeling funny.  States she thinks she might have a seizure.  Patient states another patient fell in the dining area and that triggered her.  She spoke with the MD.  Scheduled meds administered as ordered with good results           

## 2022-11-10 NOTE — Progress Notes (Signed)
   11/10/22 0854  Psych Admission Type (Psych Patients Only)  Admission Status Involuntary  Psychosocial Assessment  Patient Complaints None  Eye Contact Brief  Facial Expression Flat  Affect Sullen  Speech Logical/coherent  Interaction Assertive  Motor Activity Slow  Appearance/Hygiene Unremarkable  Behavior Characteristics Cooperative  Mood Pleasant  Thought Process  Coherency WDL  Content WDL  Delusions None reported or observed  Perception WDL  Hallucination None reported or observed;UTA  Judgment WDL  Confusion None  Danger to Self  Current suicidal ideation? Denies  Agreement Not to Harm Self Yes  Description of Agreement Verbal  Danger to Others  Danger to Others None reported or observed   Patient stated lower abdominal discomfort and that her urine is a darker yellow.  She denies pain during urination.  Patient spoke with MD.  Scheduled medication given with good results.  Patient encouraged to increase PO fluids especeially water.Marland Kitchen

## 2022-11-11 DIAGNOSIS — F332 Major depressive disorder, recurrent severe without psychotic features: Principal | ICD-10-CM | POA: Insufficient documentation

## 2022-11-11 LAB — LIPID PANEL
Cholesterol: 184 mg/dL (ref 0–200)
HDL: 46 mg/dL (ref >40)
LDL Cholesterol: 120 mg/dL — ABNORMAL HIGH (ref 0–99)
Total CHOL/HDL Ratio: 4 RATIO
Triglycerides: 92 mg/dL (ref <150)
VLDL: 18 mg/dL (ref 0–40)

## 2022-11-11 LAB — LITHIUM LEVEL: Lithium Lvl: 0.3 mmol/L — ABNORMAL LOW (ref 0.60–1.20)
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
## 2022-11-11 NOTE — Group Note (Signed)
Recreation Therapy Group Note   Group Topic:Leisure Education  Group Date: 11/11/2022 Start Time: 1000 End Time: 1105 Facilitators: Rosina Lowenstein, LRT, CTRS Location:  Day Room  Group Description: Leisure. Patients were given the option to choose from coloring, singing karaoke, or playing cards. LRT and pts discussed the meaning of leisure, the importance of participating in leisure during their free time/when they're outside of the hospital, as well as how our leisure interests can also serve as coping skills. Pt identified two leisure interests and shared with the group.   Goal Area(s) Addressed:  Patient will identify a current leisure interest.  Patient will learn the definition of "leisure". Patient will practice making a positive decision. Patient will have the opportunity to try a new leisure activity. Patient will communicate with peers and LRT.   Affect/Mood: Appropriate   Participation Level: Active and Engaged   Participation Quality: Independent   Behavior: Calm and Cooperative   Speech/Thought Process: Coherent   Insight: Good   Judgement: Good   Modes of Intervention: Activity   Patient Response to Interventions:  Attentive, Engaged, Interested , and Receptive   Education Outcome:  Acknowledges education   Clinical Observations/Individualized Feedback: Melanie Bell was active in their participation of session activities and group discussion. Pt identified "crochet and listen to music" as things that she does in her free time. Pt chose to color mandalas while in group. Pt interacted well with LRT and peers duration of session.   Plan: Continue to engage patient in RT group sessions 2-3x/week.   Rosina Lowenstein, LRT, CTRS 11/11/2022 11:37 AM

## 2022-11-11 NOTE — Progress Notes (Signed)
[facility] MD Progress Note  11/11/2022 11:43 AM Melanie Bell  MRN:  [id number] Subjective: Melanie Bell states that she is doing better on her current medications.  She says that she spoke with her husband and came up with a plan that he is going to handle her medications because she overdosed on them.  She is able to contract for safety in the hospital.  No problems with her medications and nurses report no issues. Principal Problem: Major depressive disorder, recurrent episode, severe (HCC) Diagnosis: Principal Problem:   Major depressive disorder, recurrent episode, severe, without psychosis (HCC) Active Problems:   Intentional overdose (HCC)  Total Time spent with patient: 15 minutes  Past Psychiatric History: Depression  Past Medical History:  Past Medical History:  Diagnosis Date   Allergy    Anxiety    Arthritis    Asthma    Depression    Foreign body aspiration    GERD (gastroesophageal reflux disease)    Hallucinations 09/30/2014   Sizoaffective   Hyperlipidemia    Hypertension    Hypoglycemia    Intentional ingestion of batteries 04/21/2016    2 AAA batteries and 1 thumb tack; intent to hurt herself/notes 04/21/2016   Self-inflicted injury 11/23/2017   Suicidal ideation 01/02/2018   Tardive dyskinesia 10/2014   recent onset   Thyroid disease     Past Surgical History:  Procedure Laterality Date   ABDOMINAL SURGERY     "years ago" to remove foreign objects   APPENDECTOMY     BREAST EXCISIONAL BIOPSY Right 09/03/2007   surgical bx removed fibroadeoma   BREAST LUMPECTOMY Right    CHOLECYSTECTOMY  02/2022   COLONOSCOPY WITH PROPOFOL N/A 09/10/2015   Procedure: COLONOSCOPY WITH PROPOFOL;  Surgeon: Christena Deem, MD;  Location: [facility] ENDOSCOPY;  Service: Endoscopy;  Laterality: N/A;   ESOPHAGOGASTRODUODENOSCOPY N/A 11/28/2014   Procedure: ESOPHAGOGASTRODUODENOSCOPY (EGD);  Surgeon: Scot Jun, MD;  Location: [facility] ENDOSCOPY;  Service: Endoscopy;  Laterality:  N/A;   ESOPHAGOGASTRODUODENOSCOPY N/A 02/21/2016   Procedure: ESOPHAGOGASTRODUODENOSCOPY (EGD);  Surgeon: Napoleon Form, MD;  Location: [facility] ENDOSCOPY;  Service: Endoscopy;  Laterality: N/A;   ESOPHAGOGASTRODUODENOSCOPY N/A 04/21/2016   Procedure: ESOPHAGOGASTRODUODENOSCOPY (EGD);  Surgeon: Iva Boop, MD;  Location: [facility] ENDOSCOPY;  Service: Endoscopy;  Laterality: N/A;   ESOPHAGOGASTRODUODENOSCOPY N/A 05/06/2016   Procedure: ESOPHAGOGASTRODUODENOSCOPY (EGD);  Surgeon: Wyline Mood, MD;  Location: [facility] ENDOSCOPY;  Service: Gastroenterology;  Laterality: N/A;   ESOPHAGOGASTRODUODENOSCOPY N/A 06/13/2016   Procedure: ESOPHAGOGASTRODUODENOSCOPY (EGD);  Surgeon: Midge Minium, MD;  Location: [facility] ENDOSCOPY;  Service: Endoscopy;  Laterality: N/A;   ESOPHAGOGASTRODUODENOSCOPY N/A 01/14/2022   Procedure: ESOPHAGOGASTRODUODENOSCOPY (EGD);  Surgeon: Toledo, Boykin Nearing, MD;  Location: [facility] ENDOSCOPY;  Service: Gastroenterology;  Laterality: N/A;   ESOPHAGOGASTRODUODENOSCOPY (EGD) WITH PROPOFOL N/A 02/29/2016   Procedure: ESOPHAGOGASTRODUODENOSCOPY (EGD) WITH PROPOFOL;  Surgeon: Midge Minium, MD;  Location: [facility] ENDOSCOPY;  Service: Endoscopy;  Laterality: N/A;   ESOPHAGOGASTRODUODENOSCOPY (EGD) WITH PROPOFOL N/A 01/15/2018   Procedure: ESOPHAGOGASTRODUODENOSCOPY (EGD) WITH PROPOFOL;  Surgeon: Corbin Ade, MD;  Location[facility] ENDO SUITE;  Service: Endoscopy;  Laterality: N/A;  retrieval forein body   LAPAROSCOPIC LYSIS OF ADHESIONS  02/16/2022   Procedure: LAPAROSCOPIC LYSIS OF ADHESIONS;  Surgeon: Henrene Dodge, MD;  Location: [facility] ORS;  Service: General;;   LAPAROTOMY N/A 09/12/2015   Procedure: EXPLORATORY LAPAROTOMY;  Surgeon: Lattie Haw, MD;  Location: [facility] ORS;  Service: General;  Laterality: N/A;   SIGMOIDOSCOPY N/A 09/12/2015   Procedure: Daiva Huge;  Surgeon: Lattie Haw, MD;  Location: [facility] ORS;  Service: General;  Laterality: N/A;   WISDOM TOOTH EXTRACTION     Family History:   Family History  Problem Relation Age of Onset   Depression Mother    Hypertension Mother    Sleep apnea Mother    Asthma Mother    COPD Mother    Diabetes Mother    Anxiety disorder Mother    Arthritis Mother    Heart failure Mother    Breast cancer Maternal Aunt        20's   Stroke Maternal Grandmother    Heart attack Maternal Grandfather    Family Psychiatric  History: Unremarkable Social History:  Social History   Substance and Sexual Activity  Alcohol Use No     Social History   Substance and Sexual Activity  Drug Use No    Social History   Socioeconomic History   Marital status: Married    Spouse name: Not on file   Number of children: 0   Years of education: 12   Highest education level: Not on file  Occupational History   Occupation: unemployed  Tobacco Use   Smoking status: Former    Packs/day: 0.50    Years: 3.00    Additional pack years: 0.00    Total pack years: 1.50    Types: Cigarettes    Quit date: 08/14/2016    Years since quitting: 6.2    Passive exposure: Past   Smokeless tobacco: Never  Vaping Use   Vaping Use: Never used  Substance and Sexual Activity   Alcohol use: No   Drug use: No   Sexual activity: Yes    Partners: Male    Birth control/protection: None  Other Topics Concern   Not on file  Social History Narrative   Right handed    One story home   Drinks occasional caffeine   Social Determinants of Health   Financial Resource Strain: Low Risk  (07/08/2022)   Overall Financial Resource Strain (CARDIA)    Difficulty of Paying Living Expenses: Not hard at all  Food Insecurity: No Food Insecurity (11/08/2022)   Hunger Vital Sign    Worried About Running Out of Food in the Last Year: Never true    Ran Out of Food in the Last Year: Never true  Transportation Needs: No Transportation Needs (11/08/2022)   PRAPARE - Administrator, Civil Service (Medical): No    Lack of Transportation (Non-Medical): No  Physical  Activity: Insufficiently Active (01/05/2022)   Exercise Vital Sign    Days of Exercise per Week: 2 days    Minutes of Exercise per Session: 60 min  Stress: No Stress Concern Present (08/22/2022)   [employer] of Occupational Health - Occupational Stress Questionnaire    Feeling of Stress : Only a little  Social Connections: Moderately Integrated (09/21/2022)   Social Connection and Isolation Panel [NHANES]    Frequency of Communication with Friends and Family: More than three times a week    Frequency of Social Gatherings with Friends and Family: More than three times a week    Attends Religious Services: More than 4 times per year    Active Member of [employer] or Organizations: No    Attends Banker Meetings: Never    Marital Status: Married   Additional Social History:                         Sleep: Good  Appetite:  Good  Current Medications: Current Facility-Administered Medications  Medication Dose Route Frequency Provider Last Rate Last Admin   acetaminophen (TYLENOL) tablet 650 mg  650 mg Oral Q6H PRN Bennett, Christal H, NP   650 mg at 11/10/22 2116   albuterol (VENTOLIN HFA) 108 (90 Base) MCG/ACT inhaler 2 puff  2 puff Inhalation QID PRN Rex Kras, MD       alum & mag hydroxide-simeth (MAALOX/MYLANTA) 200-200-20 MG/5ML suspension 30 mL  30 mL Oral Q4H PRN Bennett, Christal H, NP   30 mL at 11/09/22 1306   ARIPiprazole (ABILIFY) tablet 5 mg  5 mg Oral Daily Bennett, Christal H, NP   5 mg at 11/11/22 0832   [START ON 11/14/2022] cabergoline (DOSTINEX) tablet 0.5 mg  0.5 mg Oral Weekly Rex Kras, MD       diphenhydrAMINE (BENADRYL) capsule 50 mg  50 mg Oral TID PRN Bennett, Christal H, NP       Or   diphenhydrAMINE (BENADRYL) injection 50 mg  50 mg Intramuscular TID PRN Bennett, Christal H, NP       escitalopram (LEXAPRO) tablet 10 mg  10 mg Oral Daily Bennett, Christal H, NP   10 mg at 11/11/22 0830   haloperidol (HALDOL) tablet 5 mg  5 mg Oral  TID PRN Bennett, Christal H, NP       Or   haloperidol lactate (HALDOL) injection 5 mg  5 mg Intramuscular TID PRN Bennett, Christal H, NP       hydrOXYzine (ATARAX) tablet 50 mg  50 mg Oral BID PRN Willeen Cass, Christal H, NP   50 mg at 11/10/22 2117   levothyroxine (SYNTHROID) tablet 75 mcg  75 mcg Oral Q0600 Rex Kras, MD   75 mcg at 11/11/22 0600   lithium carbonate capsule 300 mg  300 mg Oral BID WC Bennett, Christal H, NP   300 mg at 11/11/22 0830   LORazepam (ATIVAN) tablet 2 mg  2 mg Oral TID PRN Bennett, Christal H, NP       Or   LORazepam (ATIVAN) injection 2 mg  2 mg Intramuscular TID PRN Bennett, Christal H, NP       magnesium hydroxide (MILK OF MAGNESIA) suspension 30 mL  30 mL Oral Daily PRN Bennett, Christal H, NP   30 mL at 11/09/22 0930   magnesium oxide (MAG-OX) tablet 400 mg  400 mg Oral Daily Rex Kras, MD   400 mg at 11/11/22 1007   meloxicam (MOBIC) tablet 15 mg  15 mg Oral Daily Rex Kras, MD   15 mg at 11/11/22 0831   ondansetron (ZOFRAN-ODT) disintegrating tablet 4 mg  4 mg Oral Q8H PRN Rex Kras, MD       pantoprazole (PROTONIX) EC tablet 40 mg  40 mg Oral Daily Rex Kras, MD   40 mg at 11/11/22 1610   prazosin (MINIPRESS) capsule 2 mg  2 mg Oral QHS Rex Kras, MD   2 mg at 11/09/22 2102   pregabalin (LYRICA) capsule 50 mg  50 mg Oral BID Rex Kras, MD   50 mg at 11/11/22 0833   topiramate (TOPAMAX) tablet 25 mg  25 mg Oral [id number] [name], DO   25 mg at 11/11/22 9604   traZODone (DESYREL) tablet 50 mg  50 mg Oral QHS PRN Thurston Hole H, NP   50 mg at 11/10/22 2117    Lab Results:  Results for orders placed or performed during the hospital encounter of 11/08/22 (from the past 48 hour(s))  Lipid panel     Status: Abnormal   Collection Time: 11/11/22  6:51 AM  Result Value Ref Range   Cholesterol 184 0 - 200 mg/dL   Triglycerides 92 <161 mg/dL   HDL 46 >09 mg/dL   Total CHOL/HDL Ratio 4.0 RATIO    VLDL 18 0 - 40 mg/dL   LDL Cholesterol 604 (H) 0 - 99 mg/dL    Comment:        Total Cholesterol/HDL:CHD Risk Coronary Heart Disease Risk Table                     Men   Women  1/2 Average Risk   3.4   3.3  Average Risk       5.0   4.4  2 X Average Risk   9.6   7.1  3 X Average Risk  23.4   11.0        Use the calculated Patient Ratio above and the CHD Risk Table to determine the patient's CHD Risk.        ATP III CLASSIFICATION (LDL):  <100     mg/dL   Optimal  540-981  mg/dL   Near or Above                    Optimal  130-159  mg/dL   Borderline  191-478  mg/dL   High  >295     mg/dL   Very High Performed at [facility], [address] [place], [place] [address]   Lithium level     Status: Abnormal   Collection Time: 11/11/22  6:51 AM  Result Value Ref Range   Lithium Lvl 0.30 (L) 0.60 - 1.20 mmol/L    Comment: Performed at [facility], [address] [place], [place] [address]    Blood Alcohol level:  Lab Results  Component Value Date   [facility] <10 11/07/2022   ETH <10 01/31/2018    Metabolic Disorder Labs: Lab Results  Component Value Date   HGBA1C 5.1 11/23/2017   MPG 99.67 11/23/2017   MPG 103 03/03/2016   Lab Results  Component Value Date   PROLACTIN 3.1 05/02/2022   PROLACTIN 17.2 11/01/2021   Lab Results  Component Value Date   CHOL 184 11/11/2022   TRIG 92 11/11/2022   HDL 46 11/11/2022   CHOLHDL 4.0 11/11/2022   VLDL 18 11/11/2022   LDLCALC 120 (H) 11/11/2022   LDLCALC 109 (H) 09/30/2021    Physical Findings: AIMS:  , ,  ,  ,    CIWA:    COWS:     Musculoskeletal: Strength & Muscle Tone: within normal limits Gait & Station: normal Patient leans: N/A  Psychiatric Specialty Exam:  Presentation  General Appearance:  Disheveled; Casual  Eye Contact: Fair  Speech: Clear and Coherent  Speech Volume: Decreased  Handedness: Right   Mood and Affect  Mood: Anxious  Affect: Constricted   Thought  Process  Thought Processes: Coherent  Descriptions of Associations:Intact  Orientation:Full (Time, Place and Person)  Thought Content:Rumination; Perseveration  History of Schizophrenia/Schizoaffective disorder:No  Duration of Psychotic Symptoms:No data recorded Hallucinations:No data recorded Ideas of Reference:None  Suicidal Thoughts:No data recorded Homicidal Thoughts:No data recorded  Sensorium  Memory: Immediate Fair; Remote Fair; Recent Fair  Judgment: Poor  Insight: Fair   Chartered certified accountant: Fair  Attention Span: Fair  Recall: [employer] of Knowledge: Fair  Language: Fair   Psychomotor Activity  Psychomotor  Activity:No data recorded  Assets  Assets: Communication Skills; Desire for Improvement; Housing   Sleep  Sleep:No data recorded    Blood pressure 108/67, pulse 78, temperature 98.6 F (37 C), temperature source Oral, resp. rate 20, height 5\' 7"  (1.702 m), weight 125.2 kg, SpO2 98 %, unknown if currently breastfeeding. Body mass index is 43.23 kg/m.   Treatment Plan Summary: Daily contact with patient to assess and evaluate symptoms and progress in treatment, Medication management, and Plan continue current medications.  [name], DO 11/11/2022, 11:43 AM

## 2022-11-11 NOTE — Progress Notes (Signed)
   11/11/22 0835  Psych Admission Type (Psych Patients Only)  Admission Status Involuntary  Psychosocial Assessment  Patient Complaints None  Eye Contact Brief  Facial Expression Flat  Affect Irritable  Speech Logical/coherent  Interaction Assertive  Motor Activity Slow  Appearance/Hygiene Unremarkable  Behavior Characteristics Anxious  Mood Anxious  Thought Process  Coherency WDL  Content WDL  Delusions None reported or observed  Perception WDL  Hallucination None reported or observed  Judgment WDL  Confusion None  Danger to Self  Current suicidal ideation? Denies  Agreement Not to Harm Self Yes  Description of Agreement Verbal  Danger to Others  Danger to Others None reported or observed   Patient alert and oriented.  Patient states her bladder feels better.  1100 patient stated she feels dizzy.  VS checked, WNL.  Patient decided to lay down and rest.  After a nap, patient stated she felt better

## 2022-11-11 NOTE — [facility] Suicide Risk Assessment (Signed)
[facility] INPATIENT:  Family/Significant Other Suicide Prevention Education  Suicide Prevention Education:  Education Completed; Melanie Bell [phone]), has been identified by the patient as the family member/significant other with whom the patient will be residing, and identified as the person(s) who will aid the patient in the event of a mental health crisis (suicidal ideations/suicide attempt).  With written consent from the patient, the family member/significant other has been provided the following suicide prevention education, prior to the and/or following the discharge of the patient.  The suicide prevention education provided includes the following: Suicide risk factors Suicide prevention and interventions National Suicide Hotline telephone number [facility] assessment telephone number [facility] Emergency Assistance 911 [facility] and/or Residential Mobile Crisis Unit telephone number  Request made of family/significant other to: Remove weapons (e.g., guns, rifles, knives), all items previously/currently identified as safety concern.   Remove drugs/medications (over-the-counter, prescriptions, illicit drugs), all items previously/currently identified as a safety concern.  The family member/significant other verbalizes understanding of the suicide prevention education information provided.  The family member/significant other agrees to remove the items of safety concern listed above.  Melanie Bell shared that pt came into treatment because she made a mistake while she was a little stressed out. He denied feeling that she is a danger to herself or anyone else. Melanie Bell also denied any access to weapons in the home. Melanie Bell and CSW discussed safety briefly as pt had shared their plans to get a lock box for her medication and that he would administer medication to her. CSW also advised that sharps should be locked up as well. He stated that this was what he had been  working on today. Melanie Bell inquired about discharge date. CSW explained that pt would not be discharged today but that it probably would not be much longer. Melanie Bell shared that he has been talking to her and that she sounds more like herself. CSW inquired if she was returning to "baseline" and Melanie Bell confirmed that he felt she was. CSW informed him that his comments would be noted. He agreed, stating that he misses his wife. No other concerns expressed. Contact ended without incident.   Glenis Smoker 11/11/2022, 3:04 PM

## 2022-11-11 NOTE — Group Note (Signed)
Date:  11/11/2022 Time:  10:00 AM  Group Topic/Focus:  Goals Group:   The focus of this group is to help patients establish daily goals to achieve during treatment and discuss how the patient can incorporate goal setting into their daily lives to aide in recovery.    Participation Level:  Active  Participation Quality:  Appropriate  Affect:  Appropriate  Cognitive:  Appropriate  Insight: Appropriate  Engagement in Group:  Engaged  Modes of Intervention:  Discussion, Education, and Support  Additional Comments:    Wilford Corner 11/11/2022, 10:00 AM

## 2022-11-12 DIAGNOSIS — F332 Major depressive disorder, recurrent severe without psychotic features: Secondary | ICD-10-CM | POA: Diagnosis not present

## 2022-11-12 LAB — CBC WITH DIFFERENTIAL/PLATELET
Abs Immature Granulocytes: 0.04 10*3/uL (ref 0.00–0.07)
Basophils Absolute: 0 10*3/uL (ref 0.0–0.1)
Basophils Relative: 0 %
Eosinophils Absolute: 0.2 10*3/uL (ref 0.0–0.5)
Eosinophils Relative: 2 %
HCT: 44.1 % (ref 36.0–46.0)
Hemoglobin: 14.2 g/dL (ref 12.0–15.0)
Immature Granulocytes: 0 %
Lymphocytes Relative: 24 %
Lymphs Abs: 2.2 10*3/uL (ref 0.7–4.0)
MCH: 30.1 pg (ref 26.0–34.0)
MCHC: 32.2 g/dL (ref 30.0–36.0)
MCV: 93.4 fL (ref 80.0–100.0)
Monocytes Absolute: 0.7 10*3/uL (ref 0.1–1.0)
Monocytes Relative: 8 %
Neutro Abs: 6 10*3/uL (ref 1.7–7.7)
Neutrophils Relative %: 66 %
Platelets: 211 10*3/uL (ref 150–400)
RBC: 4.72 MIL/uL (ref 3.87–5.11)
RDW: 12.6 % (ref 11.5–15.5)
WBC: 9.1 10*3/uL (ref 4.0–10.5)
nRBC: 0 % (ref 0.0–0.2)

## 2022-11-12 LAB — SEDIMENTATION RATE: Sed Rate: 18 mm/hr (ref 0–20)

## 2022-11-12 MED ORDER — FUROSEMIDE 40 MG PO TABS
40.0000 mg | ORAL_TABLET | Freq: Every day | ORAL | Status: DC
  Administered 2022-11-12 – 2022-11-13 (×2): 40 mg via ORAL
  Filled 2022-11-12 (×2): qty 1

## 2022-11-12 MED ORDER — POTASSIUM CHLORIDE CRYS ER 20 MEQ PO TBCR
10.0000 meq | EXTENDED_RELEASE_TABLET | Freq: Every day | ORAL | Status: DC
  Administered 2022-11-12 – 2022-11-14 (×3): 10 meq via ORAL
  Filled 2022-11-12 (×3): qty 1

## 2022-11-12 NOTE — Progress Notes (Signed)
Patient alert and oriented x 4, her thoughts are organized, speech is non tangential and coherent, she is interacting appropriately with peers and staff.

## 2022-11-12 NOTE — Progress Notes (Signed)
Patient c/o right lower extremity pain under her right knee. Patient reports this has been going on for 2 days, one of her veins in her right lower inner leg is visibly bulging./larger. When patient points toes down she hollers out in pain. Dr. Marlou Porch notified. Vital signs stable at this time. No distress currently other than rating pain in RLE 8/10. 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

## 2022-11-12 NOTE — Group Note (Signed)
Date:  11/12/2022 Time:  10:27 PM  Group Topic/Focus:  Wrap-Up Group:   The focus of this group is to help patients review their daily goal of treatment and discuss progress on daily workbooks.    Participation Level:  Active  Participation Quality:  Appropriate  Affect:  Appropriate  Cognitive:  Alert and Appropriate  Insight: Appropriate  Engagement in Group:  Engaged  Modes of Intervention:  Activity  Additional Comments:     [name],Rc Amison E 11/12/2022, 10:27 PM

## 2022-11-12 NOTE — Progress Notes (Signed)
D: Patient alert and oriented, able to make needs known. Denies SI/HI, AVH at present. Pt endorses pain r/t swelling in bilateral legs. Patient goal today "work towards discharge." Rates depression 0/10, hopelessness 0/10, and anxiety 0/10. Patient reports energy level as normal. She reports she slept well last night. Patient does not request any PRN medication at this time.   A: Scheduled medications administered to patient per MD order. Support and encouragement provided. Routine safety checks conducted every fifteen minutes. Patient informed to notify staff with problems or concerns. Frequent verbal contact made.   R: No adverse drug reactions noted. Patient contracts for safety at this time. Patient is compliant with medications and treatment plan. Patient receptive, calm and cooperative. Patient interacts with others appropriately on unit at present. Patient remains safe at present.

## 2022-11-12 NOTE — Group Note (Signed)
Date:  11/12/2022 Time:  10:50 AM  Group Topic/Focus:  Music Therapy- Outside group time, working and building team skills by playing basketball  and other group activities.    Participation Level:  Active  Participation Quality:  Appropriate  Affect:  Appropriate  Cognitive:  Alert and Appropriate  Insight: Appropriate  Engagement in Group:  Engaged  Modes of Intervention:  Activity  Additional Comments:    Doug Sou 11/12/2022, 10:50 AM

## 2022-11-12 NOTE — Group Note (Signed)
Date:  11/12/2022 Time:  6:38 PM  Group Topic/Focus:  Self Care:   The focus of this group is to help patients understand the importance of self-care in order to improve or restore emotional, physical, spiritual, interpersonal, and financial health. Gratitude assertiveness.     Participation Level:  Active  Participation Quality:  Appropriate  Affect:  Appropriate  Cognitive:  Alert and Appropriate  Insight: Appropriate  Engagement in Group:  Engaged  Modes of Intervention:  Activity and Discussion  Additional Comments:    Doug Sou 11/12/2022, 6:38 PM

## 2022-11-12 NOTE — Progress Notes (Signed)
[facility] MD Progress Note  11/12/2022 2:31 PM Melanie Bell  MRN:  [id number] Subjective: Melanie Bell is seen on rounds.  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  She has had a checkup by ultrasound recently without any DVT.  Her legs are both swollen probably from lack of activity and not keeping them up.  When to get a CBC and sed rate and start her on some Lasix.  Otherwise, she states her moods improved. Principal Problem: Major depressive disorder, recurrent episode, severe (HCC) Diagnosis: Principal Problem:   Major depressive disorder, recurrent episode, severe, without psychosis (HCC) Active Problems:   Intentional overdose (HCC)  Total Time spent with patient: 15 minutes  Past Psychiatric History: Depression with a history of overdose  Past Medical History:  Past Medical History:  Diagnosis Date   Allergy    Anxiety    Arthritis    Asthma    Depression    Foreign body aspiration    GERD (gastroesophageal reflux disease)    Hallucinations 09/30/2014   Sizoaffective   Hyperlipidemia    Hypertension    Hypoglycemia    Intentional ingestion of batteries 04/21/2016    2 AAA batteries and 1 thumb tack; intent to hurt herself/notes 04/21/2016   Self-inflicted injury 11/23/2017   Suicidal ideation 01/02/2018   Tardive dyskinesia 10/2014   recent onset   Thyroid disease     Past Surgical History:  Procedure Laterality Date   ABDOMINAL SURGERY     "years ago" to remove foreign objects   APPENDECTOMY     BREAST EXCISIONAL BIOPSY Right 09/03/2007   surgical bx removed fibroadeoma   BREAST LUMPECTOMY Right    CHOLECYSTECTOMY  02/2022   COLONOSCOPY WITH PROPOFOL N/A 09/10/2015   Procedure: COLONOSCOPY WITH PROPOFOL;  Surgeon: Christena Deem, MD;  Location: [facility] ENDOSCOPY;  Service: Endoscopy;  Laterality: N/A;   ESOPHAGOGASTRODUODENOSCOPY N/A 11/28/2014   Procedure: ESOPHAGOGASTRODUODENOSCOPY (EGD);  Surgeon: Scot Jun, MD;   Location: [facility] ENDOSCOPY;  Service: Endoscopy;  Laterality: N/A;   ESOPHAGOGASTRODUODENOSCOPY N/A 02/21/2016   Procedure: ESOPHAGOGASTRODUODENOSCOPY (EGD);  Surgeon: Napoleon Form, MD;  Location: [facility] ENDOSCOPY;  Service: Endoscopy;  Laterality: N/A;   ESOPHAGOGASTRODUODENOSCOPY N/A 04/21/2016   Procedure: ESOPHAGOGASTRODUODENOSCOPY (EGD);  Surgeon: Iva Boop, MD;  Location: [facility] ENDOSCOPY;  Service: Endoscopy;  Laterality: N/A;   ESOPHAGOGASTRODUODENOSCOPY N/A 05/06/2016   Procedure: ESOPHAGOGASTRODUODENOSCOPY (EGD);  Surgeon: Wyline Mood, MD;  Location: [facility] ENDOSCOPY;  Service: Gastroenterology;  Laterality: N/A;   ESOPHAGOGASTRODUODENOSCOPY N/A 06/13/2016   Procedure: ESOPHAGOGASTRODUODENOSCOPY (EGD);  Surgeon: Midge Minium, MD;  Location: [facility] ENDOSCOPY;  Service: Endoscopy;  Laterality: N/A;   ESOPHAGOGASTRODUODENOSCOPY N/A 01/14/2022   Procedure: ESOPHAGOGASTRODUODENOSCOPY (EGD);  Surgeon: Toledo, Boykin Nearing, MD;  Location: [facility] ENDOSCOPY;  Service: Gastroenterology;  Laterality: N/A;   ESOPHAGOGASTRODUODENOSCOPY (EGD) WITH PROPOFOL N/A 02/29/2016   Procedure: ESOPHAGOGASTRODUODENOSCOPY (EGD) WITH PROPOFOL;  Surgeon: Midge Minium, MD;  Location: [facility] ENDOSCOPY;  Service: Endoscopy;  Laterality: N/A;   ESOPHAGOGASTRODUODENOSCOPY (EGD) WITH PROPOFOL N/A 01/15/2018   Procedure: ESOPHAGOGASTRODUODENOSCOPY (EGD) WITH PROPOFOL;  Surgeon: Corbin Ade, MD;  Location: [facility] ENDO SUITE;  Service: Endoscopy;  Laterality: N/A;  retrieval forein body   LAPAROSCOPIC LYSIS OF ADHESIONS  02/16/2022   Procedure: LAPAROSCOPIC LYSIS OF ADHESIONS;  Surgeon: Henrene Dodge, MD;  Location: [facility] ORS;  Service: General;;   LAPAROTOMY N/A 09/12/2015   Procedure: EXPLORATORY LAPAROTOMY;  Surgeon: Lattie Haw, MD;  Location: [facility] ORS;  Service: General;  Laterality: N/A;  SIGMOIDOSCOPY N/A 09/12/2015   Procedure: SIGMOIDOSCOPY;  Surgeon: Lattie Haw, MD;  Location: [facility] ORS;  Service: General;   Laterality: N/A;   WISDOM TOOTH EXTRACTION     Family History:  Family History  Problem Relation Age of Onset   Depression Mother    Hypertension Mother    Sleep apnea Mother    Asthma Mother    COPD Mother    Diabetes Mother    Anxiety disorder Mother    Arthritis Mother    Heart failure Mother    Breast cancer Maternal Aunt        20's   Stroke Maternal Grandmother    Heart attack Maternal Grandfather    Family Psychiatric  History: Unremarkable Social History:  Social History   Substance and Sexual Activity  Alcohol Use No     Social History   Substance and Sexual Activity  Drug Use No    Social History   Socioeconomic History   Marital status: Married    Spouse name: Not on file   Number of children: 0   Years of education: 12   Highest education level: Not on file  Occupational History   Occupation: unemployed  Tobacco Use   Smoking status: Former    Packs/day: 0.50    Years: 3.00    Additional pack years: 0.00    Total pack years: 1.50    Types: Cigarettes    Quit date: 08/14/2016    Years since quitting: 6.2    Passive exposure: Past   Smokeless tobacco: Never  Vaping Use   Vaping Use: Never used  Substance and Sexual Activity   Alcohol use: No   Drug use: No   Sexual activity: Yes    Partners: Male    Birth control/protection: None  Other Topics Concern   Not on file  Social History Narrative   Right handed    One story home   Drinks occasional caffeine   Social Determinants of Health   Financial Resource Strain: Low Risk  (07/08/2022)   Overall Financial Resource Strain (CARDIA)    Difficulty of Paying Living Expenses: Not hard at all  Food Insecurity: No Food Insecurity (11/08/2022)   Hunger Vital Sign    Worried About Running Out of Food in the Last Year: Never true    Ran Out of Food in the Last Year: Never true  Transportation Needs: No Transportation Needs (11/08/2022)   PRAPARE - Administrator, Civil Service  (Medical): No    Lack of Transportation (Non-Medical): No  Physical Activity: Insufficiently Active (01/05/2022)   Exercise Vital Sign    Days of Exercise per Week: 2 days    Minutes of Exercise per Session: 60 min  Stress: No Stress Concern Present (08/22/2022)   [employer] of Occupational Health - Occupational Stress Questionnaire    Feeling of Stress : Only a little  Social Connections: Moderately Integrated (09/21/2022)   Social Connection and Isolation Panel [NHANES]    Frequency of Communication with Friends and Family: More than three times a week    Frequency of Social Gatherings with Friends and Family: More than three times a week    Attends Religious Services: More than 4 times per year    Active Member of [employer] or Organizations: No    Attends Banker Meetings: Never    Marital Status: Married   Additional Social History:  Sleep: Good  Appetite:  Good  Current Medications: Current Facility-Administered Medications  Medication Dose Route Frequency Provider Last Rate Last Admin   acetaminophen (TYLENOL) tablet 650 mg  650 mg Oral Q6H PRN Bennett, Christal H, NP   650 mg at 11/10/22 2116   albuterol (VENTOLIN HFA) 108 (90 Base) MCG/ACT inhaler 2 puff  2 puff Inhalation QID PRN Rex Kras, MD       alum & mag hydroxide-simeth (MAALOX/MYLANTA) 200-200-20 MG/5ML suspension 30 mL  30 mL Oral Q4H PRN Bennett, Christal H, NP   30 mL at 11/09/22 1306   ARIPiprazole (ABILIFY) tablet 5 mg  5 mg Oral Daily Bennett, Christal H, NP   5 mg at 11/12/22 0858   [START ON 11/14/2022] cabergoline (DOSTINEX) tablet 0.5 mg  0.5 mg Oral Weekly Rex Kras, MD       diphenhydrAMINE (BENADRYL) capsule 50 mg  50 mg Oral TID PRN Bennett, Christal H, NP       Or   diphenhydrAMINE (BENADRYL) injection 50 mg  50 mg Intramuscular TID PRN Bennett, Christal H, NP       escitalopram (LEXAPRO) tablet 10 mg  10 mg Oral Daily Bennett, Christal H, NP    10 mg at 11/12/22 0858   furosemide (LASIX) tablet 40 mg  40 mg Oral Daily [name], DO   40 mg at 11/12/22 1200   haloperidol (HALDOL) tablet 5 mg  5 mg Oral TID PRN Bennett, Christal H, NP       Or   haloperidol lactate (HALDOL) injection 5 mg  5 mg Intramuscular TID PRN Bennett, Christal H, NP       hydrOXYzine (ATARAX) tablet 50 mg  50 mg Oral BID PRN Bennett, Christal H, NP   50 mg at 11/10/22 2117   levothyroxine (SYNTHROID) tablet 75 mcg  75 mcg Oral Q0600 Rex Kras, MD   75 mcg at 11/12/22 1610   lithium carbonate capsule 300 mg  300 mg Oral BID WC Bennett, Christal H, NP   300 mg at 11/12/22 0858   LORazepam (ATIVAN) tablet 2 mg  2 mg Oral TID PRN Bennett, Christal H, NP       Or   LORazepam (ATIVAN) injection 2 mg  2 mg Intramuscular TID PRN Bennett, Christal H, NP       magnesium hydroxide (MILK OF MAGNESIA) suspension 30 mL  30 mL Oral Daily PRN Bennett, Christal H, NP   30 mL at 11/09/22 0930   magnesium oxide (MAG-OX) tablet 400 mg  400 mg Oral Daily Rex Kras, MD   400 mg at 11/12/22 0859   meloxicam (MOBIC) tablet 15 mg  15 mg Oral Daily Rex Kras, MD   15 mg at 11/12/22 0858   ondansetron (ZOFRAN-ODT) disintegrating tablet 4 mg  4 mg Oral Q8H PRN Rex Kras, MD       pantoprazole (PROTONIX) EC tablet 40 mg  40 mg Oral Daily Rex Kras, MD   40 mg at 11/12/22 0858   potassium chloride SA (KLOR-CON M) CR tablet 10 mEq  10 mEq Oral Daily [name], DO   10 mEq at 11/12/22 1200   prazosin (MINIPRESS) capsule 2 mg  2 mg Oral QHS Rex Kras, MD   2 mg at 11/09/22 2102   pregabalin (LYRICA) capsule 50 mg  50 mg Oral BID Rex Kras, MD   50 mg at 11/12/22 0858   topiramate (TOPAMAX) tablet 25 mg  25 mg Oral [id number] [name],  DO   25 mg at 11/12/22 0858   traZODone (DESYREL) tablet 50 mg  50 mg Oral QHS PRN Thurston Hole H, NP   50 mg at 11/11/22 2149    Lab Results:  Results for orders  placed or performed during the hospital encounter of 11/08/22 (from the past 48 hour(s))  Lipid panel     Status: Abnormal   Collection Time: 11/11/22  6:51 AM  Result Value Ref Range   Cholesterol 184 0 - 200 mg/dL   Triglycerides 92 <161 mg/dL   HDL 46 >09 mg/dL   Total CHOL/HDL Ratio 4.0 RATIO   VLDL 18 0 - 40 mg/dL   LDL Cholesterol 604 (H) 0 - 99 mg/dL    Comment:        Total Cholesterol/HDL:CHD Risk Coronary Heart Disease Risk Table                     Men   Women  1/2 Average Risk   3.4   3.3  Average Risk       5.0   4.4  2 X Average Risk   9.6   7.1  3 X Average Risk  23.4   11.0        Use the calculated Patient Ratio above and the CHD Risk Table to determine the patient's CHD Risk.        ATP III CLASSIFICATION (LDL):  <100     mg/dL   Optimal  540-981  mg/dL   Near or Above                    Optimal  130-159  mg/dL   Borderline  191-478  mg/dL   High  >295     mg/dL   Very High Performed at [facility], [address] [place], [place] [address]   Lithium level     Status: Abnormal   Collection Time: 11/11/22  6:51 AM  Result Value Ref Range   Lithium Lvl 0.30 (L) 0.60 - 1.20 mmol/L    Comment: Performed at [facility], [address] [place], [place] [address]  Sedimentation rate     Status: None   Collection Time: 11/12/22 11:21 AM  Result Value Ref Range   Sed Rate 18 0 - 20 mm/hr    Comment: Performed at [facility], [address] [place], [place] [address]  CBC with Differential/Platelet     Status: None   Collection Time: 11/12/22 11:21 AM  Result Value Ref Range   WBC 9.1 4.0 - 10.5 K/uL   RBC 4.72 3.87 - 5.11 MIL/uL   Hemoglobin 14.2 12.0 - 15.0 g/dL   HCT 95.2 84.1 - 32.4 %   MCV 93.4 80.0 - 100.0 fL   MCH 30.1 26.0 - 34.0 pg   MCHC 32.2 30.0 - 36.0 g/dL   RDW 40.1 02.7 - 25.3 %   Platelets 211 150 - 400 K/uL   nRBC 0.0 0.0 - 0.2 %   Neutrophils Relative % 66 %   Neutro Abs 6.0 1.7 - 7.7 K/uL    Lymphocytes Relative 24 %   Lymphs Abs 2.2 0.7 - 4.0 K/uL   Monocytes Relative 8 %   Monocytes Absolute 0.7 0.1 - 1.0 K/uL   Eosinophils Relative 2 %   Eosinophils Absolute 0.2 0.0 - 0.5 K/uL   Basophils Relative 0 %   Basophils Absolute 0.0 0.0 - 0.1 K/uL   Immature Granulocytes 0 %  Abs Immature Granulocytes 0.04 0.00 - 0.07 K/uL    Comment: Performed at [facility], [address] [place], [place] [address]    Blood Alcohol level:  Lab Results  Component Value Date   [facility] <10 11/07/2022   ETH <10 01/31/2018    Metabolic Disorder Labs: Lab Results  Component Value Date   HGBA1C 5.1 11/23/2017   MPG 99.67 11/23/2017   MPG 103 03/03/2016   Lab Results  Component Value Date   PROLACTIN 3.1 05/02/2022   PROLACTIN 17.2 11/01/2021   Lab Results  Component Value Date   CHOL 184 11/11/2022   TRIG 92 11/11/2022   HDL 46 11/11/2022   CHOLHDL 4.0 11/11/2022   VLDL 18 11/11/2022   LDLCALC 120 (H) 11/11/2022   LDLCALC 109 (H) 09/30/2021    Physical Findings: AIMS:  , ,  ,  ,    CIWA:    COWS:     Musculoskeletal: Strength & Muscle Tone: within normal limits Gait & Station: normal Patient leans: N/A  Psychiatric Specialty Exam:  Presentation  General Appearance:  Disheveled; Casual  Eye Contact: Fair  Speech: Clear and Coherent  Speech Volume: Decreased  Handedness: Right   Mood and Affect  Mood: Anxious  Affect: Constricted   Thought Process  Thought Processes: Coherent  Descriptions of Associations:Intact  Orientation:Full (Time, Place and Person)  Thought Content:Rumination; Perseveration  History of Schizophrenia/Schizoaffective disorder:No  Duration of Psychotic Symptoms:No data recorded Hallucinations:No data recorded Ideas of Reference:None  Suicidal Thoughts:No data recorded Homicidal Thoughts:No data recorded  Sensorium  Memory: Immediate Fair; Remote Fair; Recent  Fair  Judgment: Poor  Insight: Fair   Chartered certified accountant: Fair  Attention Span: Fair  Recall: [employer] of Knowledge: Fair  Language: Fair   Psychomotor Activity  Psychomotor Activity:No data recorded  Assets  Assets: Communication Skills; Desire for Improvement; Housing   Sleep  Sleep:No data recorded    Blood pressure 117/82, pulse 77, temperature 98.6 F (37 C), temperature source Oral, resp. rate 19, height 5\' 7"  (1.702 m), weight 125.2 kg, SpO2 98 %, unknown if currently breastfeeding. Body mass index is 43.23 kg/m.   Treatment Plan Summary: Daily contact with patient to assess and evaluate symptoms and progress in treatment, Medication management, and Plan CBC and sediment rate.  Start Lasix 40 mg/day and potassium 10 mEq daily.  t  [name], DO 11/12/2022, 2:31 PM

## 2022-11-12 NOTE — Progress Notes (Signed)
Patient alert and oriented x 4, denies pain and discomfort , denies SI/HI/AVH, interacting appropriately with peers and staff, complaint with medication regimen. 15 minutes safety checks maintained will continue to monitor closely.

## 2022-11-12 NOTE — Group Note (Signed)
LCSW Group Therapy Note  Group Date: 11/12/2022 Start Time: 1315 End Time: 1355   Type of Therapy and Topic:  Group Therapy - Coping Skills  Participation Level:  Active   Description of Group The focus of this group was to determine what unhealthy coping techniques typically are used by group members and what healthy coping techniques would be helpful in coping with various problems. Patients were guided in becoming aware of the differences between healthy and unhealthy coping techniques. Patients were asked to identify 2-3 healthy coping skills they would like to learn to use more effectively.  Therapeutic Goals Patients learned that coping is what human beings do all day long to deal with various situations in their lives Patients defined and discussed healthy vs unhealthy coping techniques Patients identified their preferred coping techniques and identified whether these were healthy or unhealthy Patients determined 2-3 healthy coping skills they would like to become more familiar with and use more often. Patients provided support and ideas to each other   Summary of Patient Progress: The patient attended group. The patient stated that she would rather explore space then the ocean. The patient stated that she uses crochet as a distracting coping skill. The patient stated that she also like to do yoga and has tried [place] chi. The patient shared that she and her husband walks and that they even walked 5 miles.    Marshell Levan, LCSWA 11/12/2022  2:17 PM

## 2022-11-13 DIAGNOSIS — F332 Major depressive disorder, recurrent severe without psychotic features: Secondary | ICD-10-CM | POA: Diagnosis not present

## 2022-11-13 MED ORDER — FUROSEMIDE 20 MG PO TABS
20.0000 mg | ORAL_TABLET | Freq: Every day | ORAL | Status: DC
  Administered 2022-11-14: 20 mg via ORAL
  Filled 2022-11-13: qty 1

## 2022-11-13 NOTE — Progress Notes (Signed)
[facility] MD Progress Note  11/13/2022 2:21 PM Melanie Bell  MRN:  [id number] Subjective: Joon is seen on rounds.  Her lab work came back within normal limits.  Sed rate was negative.  Swelling in her legs is gone down and she says that she does not have as much pain. Principal Problem: Major depressive disorder, recurrent episode, severe (HCC) Diagnosis: Principal Problem:   Major depressive disorder, recurrent episode, severe, without psychosis (HCC) Active Problems:   Intentional overdose (HCC)  Total Time spent with patient: 15 minutes  Past Psychiatric History: Depression  Past Medical History:  Past Medical History:  Diagnosis Date   Allergy    Anxiety    Arthritis    Asthma    Depression    Foreign body aspiration    GERD (gastroesophageal reflux disease)    Hallucinations 09/30/2014   Sizoaffective   Hyperlipidemia    Hypertension    Hypoglycemia    Intentional ingestion of batteries 04/21/2016    2 AAA batteries and 1 thumb tack; intent to hurt herself/notes 04/21/2016   Self-inflicted injury 11/23/2017   Suicidal ideation 01/02/2018   Tardive dyskinesia 10/2014   recent onset   Thyroid disease     Past Surgical History:  Procedure Laterality Date   ABDOMINAL SURGERY     "years ago" to remove foreign objects   APPENDECTOMY     BREAST EXCISIONAL BIOPSY Right 09/03/2007   surgical bx removed fibroadeoma   BREAST LUMPECTOMY Right    CHOLECYSTECTOMY  02/2022   COLONOSCOPY WITH PROPOFOL N/A 09/10/2015   Procedure: COLONOSCOPY WITH PROPOFOL;  Surgeon: Christena Deem, MD;  Location: [facility] ENDOSCOPY;  Service: Endoscopy;  Laterality: N/A;   ESOPHAGOGASTRODUODENOSCOPY N/A 11/28/2014   Procedure: ESOPHAGOGASTRODUODENOSCOPY (EGD);  Surgeon: Scot Jun, MD;  Location: [facility] ENDOSCOPY;  Service: Endoscopy;  Laterality: N/A;   ESOPHAGOGASTRODUODENOSCOPY N/A 02/21/2016   Procedure: ESOPHAGOGASTRODUODENOSCOPY (EGD);  Surgeon: Napoleon Form, MD;  Location: [facility]  ENDOSCOPY;  Service: Endoscopy;  Laterality: N/A;   ESOPHAGOGASTRODUODENOSCOPY N/A 04/21/2016   Procedure: ESOPHAGOGASTRODUODENOSCOPY (EGD);  Surgeon: Iva Boop, MD;  [facility];  Service: Endoscopy;  Laterality: N/A;   ESOPHAGOGASTRODUODENOSCOPY N/A 05/06/2016   Procedure: ESOPHAGOGASTRODUODENOSCOPY (EGD);  Surgeon: Wyline Mood, MD;  Location: [facility] ENDOSCOPY;  Service: Gastroenterology;  Laterality: N/A;   ESOPHAGOGASTRODUODENOSCOPY N/A 06/13/2016   Procedure: ESOPHAGOGASTRODUODENOSCOPY (EGD);  Surgeon: Midge Minium, MD;  Location: [facility] ENDOSCOPY;  Service: Endoscopy;  Laterality: N/A;   ESOPHAGOGASTRODUODENOSCOPY N/A 01/14/2022   Procedure: ESOPHAGOGASTRODUODENOSCOPY (EGD);  Surgeon: Toledo, Boykin Nearing, MD;  Location: [facility] ENDOSCOPY;  Service: Gastroenterology;  Laterality: N/A;   ESOPHAGOGASTRODUODENOSCOPY (EGD) WITH PROPOFOL N/A 02/29/2016   Procedure: ESOPHAGOGASTRODUODENOSCOPY (EGD) WITH PROPOFOL;  Surgeon: Midge Minium, MD;  Location: [facility] ENDOSCOPY;  Service: Endoscopy;  Laterality: N/A;   ESOPHAGOGASTRODUODENOSCOPY (EGD) WITH PROPOFOL N/A 01/15/2018   Procedure: ESOPHAGOGASTRODUODENOSCOPY (EGD) WITH PROPOFOL;  Surgeon: Corbin Ade, MD;  Location: [facility] ENDO SUITE;  Service: Endoscopy;  Laterality: N/A;  retrieval forein body   LAPAROSCOPIC LYSIS OF ADHESIONS  02/16/2022   Procedure: LAPAROSCOPIC LYSIS OF ADHESIONS;  Surgeon: Henrene Dodge, MD;  Location: [facility] ORS;  Service: General;;   LAPAROTOMY N/A 09/12/2015   Procedure: EXPLORATORY LAPAROTOMY;  Surgeon: Lattie Haw, MD;  Location: [facility] ORS;  Service: General;  Laterality: N/A;   SIGMOIDOSCOPY N/A 09/12/2015   Procedure: Daiva Huge;  Surgeon: Lattie Haw, MD;  Location: [facility] ORS;  Service: General;  Laterality: N/A;   WISDOM TOOTH EXTRACTION     Family History:  Family  History  Problem Relation Age of Onset   Depression Mother    Hypertension Mother    Sleep apnea Mother    Asthma Mother    COPD  Mother    Diabetes Mother    Anxiety disorder Mother    Arthritis Mother    Heart failure Mother    Breast cancer Maternal Aunt        20's   Stroke Maternal Grandmother    Heart attack Maternal Grandfather    Family Psychiatric  History: Unremarkable Social History:  Social History   Substance and Sexual Activity  Alcohol Use No     Social History   Substance and Sexual Activity  Drug Use No    Social History   Socioeconomic History   Marital status: Married    Spouse name: Not on file   Number of children: 0   Years of education: 12   Highest education level: Not on file  Occupational History   Occupation: unemployed  Tobacco Use   Smoking status: Former    Packs/day: 0.50    Years: 3.00    Additional pack years: 0.00    Total pack years: 1.50    Types: Cigarettes    Quit date: 08/14/2016    Years since quitting: 6.2    Passive exposure: Past   Smokeless tobacco: Never  Vaping Use   Vaping Use: Never used  Substance and Sexual Activity   Alcohol use: No   Drug use: No   Sexual activity: Yes    Partners: Male    Birth control/protection: None  Other Topics Concern   Not on file  Social History Narrative   Right handed    One story home   Drinks occasional caffeine   Social Determinants of Health   Financial Resource Strain: Low Risk  (07/08/2022)   Overall Financial Resource Strain (CARDIA)    Difficulty of Paying Living Expenses: Not hard at all  Food Insecurity: No Food Insecurity (11/08/2022)   Hunger Vital Sign    Worried About Running Out of Food in the Last Year: Never true    Ran Out of Food in the Last Year: Never true  Transportation Needs: No Transportation Needs (11/08/2022)   PRAPARE - Administrator, Civil Service (Medical): No    Lack of Transportation (Non-Medical): No  Physical Activity: Insufficiently Active (01/05/2022)   Exercise Vital Sign    Days of Exercise per Week: 2 days    Minutes of Exercise per Session: 60 min   Stress: No Stress Concern Present (08/22/2022)   [employer] of Occupational Health - Occupational Stress Questionnaire    Feeling of Stress : Only a little  Social Connections: Moderately Integrated (09/21/2022)   Social Connection and Isolation Panel [NHANES]    Frequency of Communication with Friends and Family: More than three times a week    Frequency of Social Gatherings with Friends and Family: More than three times a week    Attends Religious Services: More than 4 times per year    Active Member of [employer] or Organizations: No    Attends Banker Meetings: Never    Marital Status: Married   Additional Social History:                         Sleep: Good  Appetite:  Good  Current Medications: Current Facility-Administered Medications  Medication Dose Route Frequency Provider Last Rate Last Admin   acetaminophen (TYLENOL)  tablet 650 mg  650 mg Oral Q6H PRN Willeen Cass, Christal H, NP   650 mg at 11/10/22 2116   albuterol (VENTOLIN HFA) 108 (90 Base) MCG/ACT inhaler 2 puff  2 puff Inhalation QID PRN Rex Kras, MD       alum & mag hydroxide-simeth (MAALOX/MYLANTA) 200-200-20 MG/5ML suspension 30 mL  30 mL Oral Q4H PRN Bennett, Christal H, NP   30 mL at 11/09/22 1306   ARIPiprazole (ABILIFY) tablet 5 mg  5 mg Oral Daily Bennett, Christal H, NP   5 mg at 11/13/22 0817   [START ON 11/14/2022] cabergoline (DOSTINEX) tablet 0.5 mg  0.5 mg Oral Weekly Rex Kras, MD       diphenhydrAMINE (BENADRYL) capsule 50 mg  50 mg Oral TID PRN Bennett, Christal H, NP       Or   diphenhydrAMINE (BENADRYL) injection 50 mg  50 mg Intramuscular TID PRN Bennett, Christal H, NP       escitalopram (LEXAPRO) tablet 10 mg  10 mg Oral Daily Bennett, Christal H, NP   10 mg at 11/13/22 0817   furosemide (LASIX) tablet 40 mg  40 mg Oral Daily [name], DO   40 mg at 11/13/22 0818   haloperidol (HALDOL) tablet 5 mg  5 mg Oral TID PRN Bennett, Christal H, NP       Or    haloperidol lactate (HALDOL) injection 5 mg  5 mg Intramuscular TID PRN Bennett, Christal H, NP       hydrOXYzine (ATARAX) tablet 50 mg  50 mg Oral BID PRN Willeen Cass, Christal H, NP   50 mg at 11/10/22 2117   levothyroxine (SYNTHROID) tablet 75 mcg  75 mcg Oral Q0600 Rex Kras, MD   75 mcg at 11/13/22 0981   lithium carbonate capsule 300 mg  300 mg Oral BID WC Bennett, Christal H, NP   300 mg at 11/13/22 0817   LORazepam (ATIVAN) tablet 2 mg  2 mg Oral TID PRN Bennett, Christal H, NP       Or   LORazepam (ATIVAN) injection 2 mg  2 mg Intramuscular TID PRN Bennett, Christal H, NP       magnesium hydroxide (MILK OF MAGNESIA) suspension 30 mL  30 mL Oral Daily PRN Bennett, Christal H, NP   30 mL at 11/09/22 0930   magnesium oxide (MAG-OX) tablet 400 mg  400 mg Oral Daily Rex Kras, MD   400 mg at 11/13/22 0818   meloxicam (MOBIC) tablet 15 mg  15 mg Oral Daily Rex Kras, MD   15 mg at 11/13/22 0817   ondansetron (ZOFRAN-ODT) disintegrating tablet 4 mg  4 mg Oral Q8H PRN Rex Kras, MD       pantoprazole (PROTONIX) EC tablet 40 mg  40 mg Oral Daily Rex Kras, MD   40 mg at 11/13/22 0818   potassium chloride SA (KLOR-CON M) CR tablet 10 mEq  10 mEq Oral Daily [name], DO   10 mEq at 11/13/22 1914   prazosin (MINIPRESS) capsule 2 mg  2 mg Oral QHS Rex Kras, MD   2 mg at 11/09/22 2102   pregabalin (LYRICA) capsule 50 mg  50 mg Oral BID Rex Kras, MD   50 mg at 11/13/22 0818   topiramate (TOPAMAX) tablet 25 mg  25 mg Oral [id number] [name], DO   25 mg at 11/13/22 7829   traZODone (DESYREL) tablet 50 mg  50 mg Oral QHS PRN Hampton Abbot, NP  50 mg at 11/12/22 2150    Lab Results:  Results for orders placed or performed during the hospital encounter of 11/08/22 (from the past 48 hour(s))  Sedimentation rate     Status: None   Collection Time: 11/12/22 11:21 AM  Result Value Ref Range   Sed Rate 18 0 - 20 mm/hr     Comment: Performed at [facility], [address] [place], [place] [address]  CBC with Differential/Platelet     Status: None   Collection Time: 11/12/22 11:21 AM  Result Value Ref Range   WBC 9.1 4.0 - 10.5 K/uL   RBC 4.72 3.87 - 5.11 MIL/uL   Hemoglobin 14.2 12.0 - 15.0 g/dL   HCT 60.4 54.0 - 98.1 %   MCV 93.4 80.0 - 100.0 fL   MCH 30.1 26.0 - 34.0 pg   MCHC 32.2 30.0 - 36.0 g/dL   RDW 19.1 47.8 - 29.5 %   Platelets 211 150 - 400 K/uL   nRBC 0.0 0.0 - 0.2 %   Neutrophils Relative % 66 %   Neutro Abs 6.0 1.7 - 7.7 K/uL   Lymphocytes Relative 24 %   Lymphs Abs 2.2 0.7 - 4.0 K/uL   Monocytes Relative 8 %   Monocytes Absolute 0.7 0.1 - 1.0 K/uL   Eosinophils Relative 2 %   Eosinophils Absolute 0.2 0.0 - 0.5 K/uL   Basophils Relative 0 %   Basophils Absolute 0.0 0.0 - 0.1 K/uL   Immature Granulocytes 0 %   Abs Immature Granulocytes 0.04 0.00 - 0.07 K/uL    Comment: Performed at [facility], [address] [place], [place] [address]    Blood Alcohol level:  Lab Results  Component Value Date   [facility] <10 11/07/2022   ETH <10 01/31/2018    Metabolic Disorder Labs: Lab Results  Component Value Date   HGBA1C 5.1 11/23/2017   MPG 99.67 11/23/2017   MPG 103 03/03/2016   Lab Results  Component Value Date   PROLACTIN 3.1 05/02/2022   PROLACTIN 17.2 11/01/2021   Lab Results  Component Value Date   CHOL 184 11/11/2022   TRIG 92 11/11/2022   HDL 46 11/11/2022   CHOLHDL 4.0 11/11/2022   VLDL 18 11/11/2022   LDLCALC 120 (H) 11/11/2022   LDLCALC 109 (H) 09/30/2021    Physical Findings: AIMS:  , ,  ,  ,    CIWA:    COWS:     Musculoskeletal: Strength & Muscle Tone: within normal limits Gait & Station: normal Patient leans: N/A  Psychiatric Specialty Exam:  Presentation  General Appearance:  Disheveled; Casual  Eye Contact: Fair  Speech: Clear and Coherent  Speech Volume: Decreased  Handedness: Right   Mood and Affect   Mood: Anxious  Affect: Constricted   Thought Process  Thought Processes: Coherent  Descriptions of Associations:Intact  Orientation:Full (Time, Place and Person)  Thought Content:Rumination; Perseveration  History of Schizophrenia/Schizoaffective disorder:No  Duration of Psychotic Symptoms:No data recorded Hallucinations:No data recorded Ideas of Reference:None  Suicidal Thoughts:No data recorded Homicidal Thoughts:No data recorded  Sensorium  Memory: Immediate Fair; Remote Fair; Recent Fair  Judgment: Poor  Insight: Fair   Chartered certified accountant: Fair  Attention Span: Fair  Recall: [employer] of Knowledge: Fair  Language: Fair   Psychomotor Activity  Psychomotor Activity:No data recorded  Assets  Assets: Communication Skills; Desire for Improvement; Housing   Sleep  Sleep:No data recorded    Blood pressure (!) 111/55, pulse 76,  temperature 98.4 F (36.9 C), temperature source Oral, resp. rate 16, height 5\' 7"  (1.702 m), weight 125.2 kg, SpO2 98 %, unknown if currently breastfeeding. Body mass index is 43.23 kg/m.   Treatment Plan Summary: Daily contact with patient to assess and evaluate symptoms and progress in treatment, Medication management, and Plan continue current medications.  [name], DO 11/13/2022, 2:21 PM

## 2022-11-13 NOTE — Progress Notes (Signed)
D: Patient alert and oriented, able to make needs known. Denies SI/HI, AVH at present. Denies pain at present. Patient goal today "plan for discharge." Rates depression 0/10, hopelessness 0/10, and anxiety 0/10. Patient reports energy level as normal. She reports she slept good last night. Patient does not request any PRN medication at this time.   Bilateral legs are less swollen today than they were yesterday. She denies any pain in right lower extremity today.    A: Scheduled medications administered to patient per MD order. Support and encouragement provided. Routine safety checks conducted every fifteen minutes. Patient informed to notify staff with problems or concerns. Frequent verbal contact made.   R: No adverse drug reactions noted. Patient contracts for safety at this time. Patient is compliant with medications and treatment plan. Patient receptive, calm and cooperative. Patient interacts with others appropriately on unit at present. Patient remains safe at present.

## 2022-11-13 NOTE — Group Note (Signed)
Date:  11/13/2022 Time:  10:07 AM  Group Topic/Focus:  Activity Group:  The focus of this group is to encourage patients to come outside to the courtyard to assist with their physical and mental wellbeing.    Participation Level:  Active  Participation Quality:  Appropriate  Affect:  Appropriate  Cognitive:  Appropriate  Insight: Appropriate  Engagement in Group:  Engaged  Modes of Intervention:  Activity  Additional Comments:    Melanie Bell M Melanie Bell 11/13/2022, 10:07 AM  

## 2022-11-13 NOTE — Group Note (Signed)
Date:  11/13/2022 Time:  3:48 PM  Group Topic/Focus:  Activity Group:  The purpose of this group is to encourage patients to come outside to the courtyard to get some fresh air for the support of their physical and mental wellbeing.    Participation Level:  Active  Participation Quality:  Appropriate  Affect:  Appropriate  Cognitive:  Appropriate  Insight: Appropriate  Engagement in Group:  Engaged  Modes of Intervention:  Activity  Additional Comments:    Kallon Caylor M Siham Bucaro 11/13/2022, 3:48 PM  

## 2022-11-13 NOTE — Plan of Care (Signed)
  Problem: Self-Concept: Goal: Ability to disclose and discuss suicidal ideas will improve Outcome: Progressing Goal: Will verbalize positive feelings about self Outcome: Progressing   Problem: Coping: Goal: Coping ability will improve Outcome: Progressing Goal: Will verbalize feelings Outcome: Progressing   Problem: Self-Concept: Goal: Level of anxiety will decrease Outcome: Progressing

## 2022-11-14 ENCOUNTER — Ambulatory Visit: Payer: Medicaid Other

## 2022-11-14 MED ORDER — CABERGOLINE 0.5 MG PO TABS: 0.5000 mg | ORAL_TABLET | ORAL | 3 refills | Status: DC

## 2022-11-14 MED ORDER — LITHIUM CARBONATE 300 MG PO CAPS: 300.0000 mg | ORAL_CAPSULE | Freq: Two times a day (BID) | ORAL | 3 refills | Status: DC

## 2022-11-14 MED ORDER — FUROSEMIDE 20 MG PO TABS: 20.0000 mg | ORAL_TABLET | Freq: Every day | ORAL | 3 refills | Status: DC

## 2022-11-14 MED ORDER — PRAZOSIN HCL 2 MG PO CAPS: 2.0000 mg | ORAL_CAPSULE | Freq: Every day | ORAL | 3 refills | Status: DC

## 2022-11-14 MED ORDER — TOPIRAMATE 25 MG PO TABS: 25.0000 mg | ORAL_TABLET | Freq: Two times a day (BID) | ORAL | 3 refills | Status: DC

## 2022-11-14 MED ORDER — TRAZODONE HCL 50 MG PO TABS: 50.0000 mg | ORAL_TABLET | Freq: Every evening | ORAL | 3 refills | Status: DC | PRN

## 2022-11-14 MED ORDER — ARIPIPRAZOLE 5 MG PO TABS: 5.0000 mg | ORAL_TABLET | Freq: Every day | ORAL | 3 refills | Status: DC

## 2022-11-14 MED ORDER — ESCITALOPRAM OXALATE 10 MG PO TABS: 10.0000 mg | ORAL_TABLET | Freq: Every day | ORAL | 3 refills | Status: DC

## 2022-11-14 MED ORDER — POTASSIUM CHLORIDE CRYS ER 10 MEQ PO TBCR: 10.0000 meq | EXTENDED_RELEASE_TABLET | Freq: Every day | ORAL | 3 refills | Status: DC

## 2022-11-14 MED ORDER — LEVOTHYROXINE SODIUM 75 MCG PO TABS: 75.0000 ug | ORAL_TABLET | Freq: Every day | ORAL | 3 refills | Status: DC

## 2022-11-14 NOTE — Group Note (Signed)
Date:  11/14/2022 Time:  6:58 AM  Group Topic/Focus:  Wrap-Up Group:   The focus of this group is to help patients review their daily goal of treatment and discuss progress on daily workbooks.    Participation Level:  Active  Participation Quality:  Appropriate  Affect:  Appropriate  Cognitive:  Alert  Insight: Appropriate  Engagement in Group:  Engaged  Modes of Intervention:  Discussion  Additional Comments:     [name],Melanie Bell 11/14/2022, 6:58 AM

## 2022-11-14 NOTE — Progress Notes (Signed)
Patient pleasant and cooperative on approach. Denies SI,HI and AVH. Verbalized understanding discharge instructions,prescriptions and follow up care.  All belongings returned from [employer]. Suicide safety plan filled by patient and placed in chart. Copy given to patient.Patient escorted out by staff and transported by cab.

## 2022-11-14 NOTE — Group Note (Signed)
[facility] LCSW Group Therapy Note    Group Date: 11/14/2022 Start Time: 1300 End Time: 1350  Type of Therapy and Topic:  Group Therapy:  Overcoming Obstacles  Participation Level:  [facility] PARTICIPATION LEVEL: Active  Description of Group:   In this group patients will be encouraged to explore what they see as obstacles to their own wellness and recovery. They will be guided to discuss their thoughts, feelings, and behaviors related to these obstacles. The group will process together ways to cope with barriers, with attention given to specific choices patients can make. Each patient will be challenged to identify changes they are motivated to make in order to overcome their obstacles. This group will be process-oriented, with patients participating in exploration of their own experiences as well as giving and receiving support and challenge from other group members.  Therapeutic Goals: 1. Patient will identify personal and current obstacles as they relate to admission. 2. Patient will identify barriers that currently interfere with their wellness or overcoming obstacles.  3. Patient will identify feelings, thought process and behaviors related to these barriers. 4. Patient will identify two changes they are willing to make to overcome these obstacles:    Summary of Patient Progress Patient was present for the entirety of the group process. She identified her obstacle as her emotions, the feelings of anxiety, stress, and overwhelmed. Lack of support was identified as a potential barrier to overcoming obstacles, however, pt does note that she has lots of support but this is something that could cause a potential barrier for someone. She shared that she needs to be more willing to open up and talk to someone. Pt acknowledged that in the past she allowed herself to become isolative instead of reaching out to others or seeking the help that she knows she needs. Pt  identified prayer, crocheting, journaling, and reading the bible as tools in her toolbox. She appeared to have insight into the topic. Pt appeared open and receptive to feedback/comments from both peers and facilitator.    Therapeutic Modalities:   Cognitive Behavioral Therapy Solution Focused Therapy Motivational Interviewing Relapse Prevention Therapy   Glenis Smoker, LCSW

## 2022-11-14 NOTE — Progress Notes (Signed)
  [facility] Adult Case Management Discharge Plan :  Will you be returning to the same living situation after discharge:  Yes,  pt reports that she is returning home. At discharge, do you have transportation home?: Yes,  CSW to assist with transportation needs. Do you have the ability to pay for your medications: Yes,  [place] MEDICAID / [facility] HEALTH  Release of information consent forms completed and in the chart;  Patient's signature needed at discharge.  Patient to Follow up at:  Follow-up Information     [address] [facility] [place] [employer]. Follow up.   Why: Frederich Chick will follow up with you 11/15/2022. Contact information: [address] [place] [address] [place] [address] [phone]                 Next level of care provider has access to [facility] Link:no  Safety Planning and Suicide Prevention discussed: Yes,  SPE completed with the patient's husband.     Has patient been referred to the Quitline?: Patient does not use tobacco/nicotine products  Patient has been referred for addiction treatment: No known substance use disorder.  Harden Mo, LCSW 11/14/2022, 12:15 PM

## 2022-11-14 NOTE — Progress Notes (Signed)
Patient is alert and oriented no distress noted, interacting appropriately with peers and staff, her thoughts are organized and coherent, she is complaint with medication regimen, 15 minutes safety checks maintained.

## 2022-11-14 NOTE — Group Note (Signed)
Recreation Therapy Group Note   Group Topic:Goal Setting  Group Date: 11/14/2022 Start Time: 1000 End Time: 1115 Facilitators: Clinton Gallant, CTRS Location:  Craft Room  Group Description: Vision Board. Patients were given many different magazines, a glue stick, markers, and a piece of cardstock paper. LRT and pts discussed the importance of having goals in life. LRT and pts discussed the difference between short-term and long-term goals, as well as what a SMART goal is. LRT encouraged pts to create a vision board, with images they picked and then cut out with safety scissors from the magazine, for themselves, that capture their short and long-term goals. LRT encouraged pts to show and explain their vision board to the group. LRT offered to laminate vision board once dry and complete.   Goal Area(s) Addressed:  Patient will gain knowledge of short vs. long term goals.  Patient will identify goals for themselves. Patient will practice setting SMART goals. Patient will verbalize their goals to LRT and peers.  Affect/Mood: Appropriate   Participation Level: Active and Engaged   Participation Quality: Independent   Behavior: Appropriate, Calm, Cooperative, and Eager   Speech/Thought Process: Coherent   Insight: Good   Judgement: Good   Modes of Intervention: Art and Activity   Patient Response to Interventions:  Attentive, Engaged, Interested Receptive    Education Outcome:  Acknowledges education   Clinical Observations/Individualized Feedback: Melanie Bell was active in their participation of session activities and group discussion. Pt identified "I want to continue to weight better and lose weight. I have lost some, but I want to lose more." Pt interacted well with LRT and peers duration of session.   Plan: Continue to engage patient in RT group sessions 2-3x/week.   Melanie Bell, LRT, CTRS 11/14/2022 11:41 AM

## 2022-11-14 NOTE — Discharge Summary (Signed)
Physician Discharge Summary Note  Patient:  Melanie Bell is an 32 y.o., female MRN:  [id number] DOB:  11-25-1990 Patient phone:  [phone] (home)  Patient address:   [address] [place] [address],  Total Time spent with patient: 1 hour  Date of Admission:  11/08/2022 Date of Discharge: 11/14/2022  Reason for Admission:  Patient is a 32 year old Caucasian female with a prior history of suicidal attempts who was admitted status post an overdose in a suicidal gesture. The patient lives with her husband and her stepson in [place], [place].  She gives a history of feeling overwhelmed with multiple health issues both in herself and her husband who has severe diabetes.  They have been having some financial challenges and apparently recently lost the vehicle.  She explains that the series of events leading to her getting extremely distressed and frustrated.  She admits that she took the pills in an overdose attempt impulsively without thinking.  She expresses remorse and regret on her actions.  Although she has a long history of prior suicidal behavior including self injury injurious behavior with multiple prior hospitalizations, this is the first time she actually took an overdose of prescription medications. Patient is followed up by the ACT team and sees Dr. Mervyn Skeeters on a regular basis.  She reports that her husband is supportive.  Principal Problem: Major depressive disorder, recurrent episode, severe (HCC) Discharge Diagnoses: Principal Problem:   Major depressive disorder, recurrent episode, severe, without psychosis (HCC) Active Problems:   Intentional overdose [facility])   Past Psychiatric History: Depression  Past Medical History:  Past Medical History:  Diagnosis Date   Allergy    Anxiety    Arthritis    Asthma    Depression    Foreign body aspiration    GERD (gastroesophageal reflux disease)    Hallucinations 09/30/2014   Sizoaffective   Hyperlipidemia     Hypertension    Hypoglycemia    Intentional ingestion of batteries 04/21/2016    2 AAA batteries and 1 thumb tack; intent to hurt herself/notes 04/21/2016   Self-inflicted injury 11/23/2017   Suicidal ideation 01/02/2018   Tardive dyskinesia 10/2014   recent onset   Thyroid disease     Past Surgical History:  Procedure Laterality Date   ABDOMINAL SURGERY     "years ago" to remove foreign objects   APPENDECTOMY     BREAST EXCISIONAL BIOPSY Right 09/03/2007   surgical bx removed fibroadeoma   BREAST LUMPECTOMY Right    CHOLECYSTECTOMY  02/2022   COLONOSCOPY WITH PROPOFOL N/A 09/10/2015   Procedure: COLONOSCOPY WITH PROPOFOL;  Surgeon: Christena Deem, MD;  Location: [facility] ENDOSCOPY;  Service: Endoscopy;  Laterality: N/A;   ESOPHAGOGASTRODUODENOSCOPY N/A 11/28/2014   Procedure: ESOPHAGOGASTRODUODENOSCOPY (EGD);  Surgeon: Scot Jun, MD;  Location: [facility] ENDOSCOPY;  Service: Endoscopy;  Laterality: N/A;   ESOPHAGOGASTRODUODENOSCOPY N/A 02/21/2016   Procedure: ESOPHAGOGASTRODUODENOSCOPY (EGD);  Surgeon: Napoleon Form, MD;  Location: [facility] ENDOSCOPY;  Service: Endoscopy;  Laterality: N/A;   ESOPHAGOGASTRODUODENOSCOPY N/A 04/21/2016   Procedure: ESOPHAGOGASTRODUODENOSCOPY (EGD);  Surgeon: Iva Boop, MD;  Location: [facility] ENDOSCOPY;  Service: Endoscopy;  Laterality: N/A;   ESOPHAGOGASTRODUODENOSCOPY N/A 05/06/2016   Procedure: ESOPHAGOGASTRODUODENOSCOPY (EGD);  Surgeon: Wyline Mood, MD;  Location: [facility] ENDOSCOPY;  Service: Gastroenterology;  Laterality: N/A;   ESOPHAGOGASTRODUODENOSCOPY N/A 06/13/2016   Procedure: ESOPHAGOGASTRODUODENOSCOPY (EGD);  Surgeon: Midge Minium, MD;  Location: [facility] ENDOSCOPY;  Service: Endoscopy;  Laterality: N/A;   ESOPHAGOGASTRODUODENOSCOPY N/A 01/14/2022   Procedure: ESOPHAGOGASTRODUODENOSCOPY (  EGD);  Surgeon: Toledo, Boykin Nearing, MD;  Location: [facility] ENDOSCOPY;  Service: Gastroenterology;  Laterality: N/A;   ESOPHAGOGASTRODUODENOSCOPY (EGD) WITH PROPOFOL  N/A 02/29/2016   Procedure: ESOPHAGOGASTRODUODENOSCOPY (EGD) WITH PROPOFOL;  Surgeon: Midge Minium, MD;  Location: [facility] ENDOSCOPY;  Service: Endoscopy;  Laterality: N/A;   ESOPHAGOGASTRODUODENOSCOPY (EGD) WITH PROPOFOL N/A 01/15/2018   Procedure: ESOPHAGOGASTRODUODENOSCOPY (EGD) WITH PROPOFOL;  Surgeon: Corbin Ade, MD;  Location[facility] ENDO SUITE;  Service: Endoscopy;  Laterality: N/A;  retrieval forein body   LAPAROSCOPIC LYSIS OF ADHESIONS  02/16/2022   Procedure: LAPAROSCOPIC LYSIS OF ADHESIONS;  Surgeon: Henrene Dodge, MD;  Location: [facility] ORS;  Service: General;;   LAPAROTOMY N/A 09/12/2015   Procedure: EXPLORATORY LAPAROTOMY;  Surgeon: Lattie Haw, MD;  Location: [facility] ORS;  Service: General;  Laterality: N/A;   SIGMOIDOSCOPY N/A 09/12/2015   Procedure: Daiva Huge;  Surgeon: Lattie Haw, MD;  Location: [facility] ORS;  Service: General;  Laterality: N/A;   WISDOM TOOTH EXTRACTION     Family History:  Family History  Problem Relation Age of Onset   Depression Mother    Hypertension Mother    Sleep apnea Mother    Asthma Mother    COPD Mother    Diabetes Mother    Anxiety disorder Mother    Arthritis Mother    Heart failure Mother    Breast cancer Maternal Aunt        20's   Stroke Maternal Grandmother    Heart attack Maternal Grandfather    Family Psychiatric  History: Unremarkable Social History:  Social History   Substance and Sexual Activity  Alcohol Use No     Social History   Substance and Sexual Activity  Drug Use No    Social History   Socioeconomic History   Marital status: Married    Spouse name: Not on file   Number of children: 0   Years of education: 12   Highest education level: Not on file  Occupational History   Occupation: unemployed  Tobacco Use   Smoking status: Former    Packs/day: 0.50    Years: 3.00    Additional pack years: 0.00    Total pack years: 1.50    Types: Cigarettes    Quit date: 08/14/2016    Years since quitting: 6.2     Passive exposure: Past   Smokeless tobacco: Never  Vaping Use   Vaping Use: Never used  Substance and Sexual Activity   Alcohol use: No   Drug use: No   Sexual activity: Yes    Partners: Male    Birth control/protection: None  Other Topics Concern   Not on file  Social History Narrative   Right handed    One story home   Drinks occasional caffeine   Social Determinants of Health   Financial Resource Strain: Low Risk  (07/08/2022)   Overall Financial Resource Strain (CARDIA)    Difficulty of Paying Living Expenses: Not hard at all  Food Insecurity: No Food Insecurity (11/08/2022)   Hunger Vital Sign    Worried About Running Out of Food in the Last Year: Never true    Ran Out of Food in the Last Year: Never true  Transportation Needs: No Transportation Needs (11/08/2022)   PRAPARE - Administrator, Civil Service (Medical): No    Lack of Transportation (Non-Medical): No  Physical Activity: Insufficiently Active (01/05/2022)   Exercise Vital Sign    Days of Exercise per Week: 2 days  Minutes of Exercise per Session: 60 min  Stress: No Stress Concern Present (08/22/2022)   [employer] of Occupational Health - Occupational Stress Questionnaire    Feeling of Stress : Only a little  Social Connections: Moderately Integrated (09/21/2022)   Social Connection and Isolation Panel [NHANES]    Frequency of Communication with Friends and Family: More than three times a week    Frequency of Social Gatherings with Friends and Family: More than three times a week    Attends Religious Services: More than 4 times per year    Active Member of [employer] or Organizations: No    Attends Banker Meetings: Never    Marital Status: Married    Hospital Course: Jlah was involuntarily admitted to inpatient psychiatry status post overdose and depression.  She was placed back on her medications which included Abilify and Lexapro.  She has a history of thrombophlebitis which  acted up while she was on the unit and started on Lasix.  Her lab work was normal and her sedimentation rate was normal.  Her leg pain improved with Lasix.  She was pleasant and cooperative while on the unit and compliant with medications.  She denies any side effects from her medicines.  She participated in individual and group therapy.  It is felt that she maximized hospitalization she was discharged home.  On the day of discharge she denied suicidal ideation, homicidal ideation, auditory or visual hallucinations.  Her judgment and insight were good.  Physical Findings: AIMS:  , ,  ,  ,    CIWA:    COWS:     Musculoskeletal: Strength & Muscle Tone: within normal limits Gait & Station: normal Patient leans: N/A   Psychiatric Specialty Exam:  Presentation  General Appearance:  Disheveled; Casual  Eye Contact: Fair  Speech: Clear and Coherent  Speech Volume: Decreased  Handedness: Right   Mood and Affect  Mood: Anxious  Affect: Constricted   Thought Process  Thought Processes: Coherent  Descriptions of Associations:Intact  Orientation:Full (Time, Place and Person)  Thought Content:Rumination; Perseveration  History of Schizophrenia/Schizoaffective disorder:No  Duration of Psychotic Symptoms:No data recorded Hallucinations:No data recorded Ideas of Reference:None  Suicidal Thoughts:No data recorded Homicidal Thoughts:No data recorded  Sensorium  Memory: Immediate Fair; Remote Fair; Recent Fair  Judgment: Poor  Insight: Fair   Chartered certified accountant: Fair  Attention Span: Fair  Recall: [employer] of Knowledge: Fair  Language: Fair   Psychomotor Activity  Psychomotor Activity:No data recorded  Assets  Assets: Manufacturing systems engineer; Desire for Improvement; Housing   Sleep  Sleep:No data recorded   Physical Exam: Physical Exam Vitals and nursing note reviewed.  Constitutional:      Appearance: Normal  appearance. She is normal weight.  Neurological:     General: No focal deficit present.     Mental Status: She is alert and oriented to person, place, and time.  Psychiatric:        Attention and Perception: Attention and perception normal.        Mood and Affect: Mood and affect normal.        Speech: Speech normal.        Behavior: Behavior normal. Behavior is cooperative.        Thought Content: Thought content normal.        Cognition and Memory: Cognition and memory normal.        Judgment: Judgment normal.    Review of Systems  Constitutional: Negative.   HENT:  Negative.    Eyes: Negative.   Respiratory: Negative.    Cardiovascular: Negative.   Gastrointestinal: Negative.   Genitourinary: Negative.   Musculoskeletal: Negative.   Skin: Negative.   Neurological: Negative.   Endo/Heme/Allergies: Negative.   Psychiatric/Behavioral: Negative.     Blood pressure 111/71, pulse 71, temperature 98.4 F (36.9 C), temperature source Oral, resp. rate (!) 22, height 5\' 7"  (1.702 m), weight 125.2 kg, SpO2 97 %, unknown if currently breastfeeding. Body mass index is 43.23 kg/m.   Social History   Tobacco Use  Smoking Status Former   Packs/day: 0.50   Years: 3.00   Additional pack years: 0.00   Total pack years: 1.50   Types: Cigarettes   Quit date: 08/14/2016   Years since quitting: 6.2   Passive exposure: Past  Smokeless Tobacco Never   Tobacco Cessation:  A prescription for an FDA-approved tobacco cessation medication was offered at discharge and the patient refused   Blood Alcohol level:  Lab Results  Component Value Date   ETH <10 11/07/2022   ETH <10 01/31/2018    Metabolic Disorder Labs:  Lab Results  Component Value Date   HGBA1C 5.1 11/23/2017   MPG 99.67 11/23/2017   MPG 103 03/03/2016   Lab Results  Component Value Date   PROLACTIN 3.1 05/02/2022   PROLACTIN 17.2 11/01/2021   Lab Results  Component Value Date   CHOL 184 11/11/2022   TRIG 92  11/11/2022   HDL 46 11/11/2022   CHOLHDL 4.0 11/11/2022   VLDL 18 11/11/2022   LDLCALC 120 (H) 11/11/2022   LDLCALC 109 (H) 09/30/2021    See Psychiatric Specialty Exam and Suicide Risk Assessment completed by Attending Physician prior to discharge.  Discharge destination:  Home  Is patient on multiple antipsychotic therapies at discharge:  No   Has Patient had three or more failed trials of antipsychotic monotherapy by history:  No  Recommended Plan for Multiple Antipsychotic Therapies: NA   Allergies as of 11/14/2022       Reactions   Betadine [povidone Iodine] Other (See Comments)   Reaction:  Unknown    Fish Allergy Hives   Shellfish-derived Products Other (See Comments)   Reaction:  Unknown         Medication List     STOP taking these medications    cefdinir 300 MG capsule Commonly known as: OMNICEF   COVID-19 At Home Antigen Test Kit   fluticasone-salmeterol 45-21 MCG/ACT inhaler Commonly known as: ADVAIR HFA   magnesium oxide 400 MG tablet Commonly known as: MAG-OX   ondansetron 4 MG disintegrating tablet Commonly known as: ZOFRAN-ODT   pregabalin 50 MG capsule Commonly known as: LYRICA   silver sulfADIAZINE 1 % cream Commonly known as: SILVADENE   sucralfate 1 g tablet Commonly known as: Carafate   triamcinolone cream 0.1 % Commonly known as: KENALOG       TAKE these medications      Indication  albuterol 108 (90 Base) MCG/ACT inhaler Commonly known as: VENTOLIN HFA 2 puffs four times a day as needed  Indication: Spasm of Lung Air Passages   ARIPiprazole 5 MG tablet Commonly known as: ABILIFY Take 1 tablet (5 mg total) by mouth daily.  Indication: Major Depressive Disorder   cabergoline 0.5 MG tablet Commonly known as: DOSTINEX Take 1 tablet (0.5 mg total) by mouth once a week.  Indication: Disorder with Elevated Levels of Prolactin in the Blood   cetirizine 10 MG tablet Commonly known as: ZYRTEC Take 4  tablets (40 mg total)  by mouth daily for 5 days.    escitalopram 10 MG tablet Commonly known as: LEXAPRO Take 1 tablet (10 mg total) by mouth daily.  Indication: Major Depressive Disorder   furosemide 20 MG tablet Commonly known as: LASIX Take 1 tablet (20 mg total) by mouth daily. Start taking on: November 15, 2022  Indication: Edema   hydrOXYzine 50 MG capsule Commonly known as: VISTARIL Take 50 mg by mouth 2 (two) times daily as needed.  Indication: Feeling Anxious   levothyroxine 75 MCG tablet Commonly known as: SYNTHROID Take 1 tablet (75 mcg total) by mouth daily before breakfast.  Indication: Underactive Thyroid   lithium carbonate 300 MG capsule Take 1 capsule (300 mg total) by mouth 2 (two) times daily with a meal.  Indication: Major Depressive Disorder, Schizoaffective Disorder   meloxicam 15 MG tablet Commonly known as: MOBIC Take 1 tablet (15 mg total) by mouth daily.  Indication: Joint Damage causing Pain and Loss of Function   omeprazole 20 MG capsule Commonly known as: PRILOSEC TAKE 1 CAPSULE (20 MG TOTAL) BY MOUTH DAILY.  Indication: Gastroesophageal Reflux Disease   potassium chloride 10 MEQ tablet Commonly known as: KLOR-CON M Take 1 tablet (10 mEq total) by mouth daily. Start taking on: November 15, 2022  Indication: Low Amount of Potassium in the Blood   prazosin 2 MG capsule Commonly known as: MINIPRESS Take 1 capsule (2 mg total) by mouth at bedtime.  Indication: Frightening Dreams, Nightmares   SUMAtriptan 20 MG/ACT nasal spray Commonly known as: IMITREX Place 20 mg into the nose as needed.  Indication: Migraine Headache   topiramate 25 MG tablet Commonly known as: TOPAMAX Take 1 tablet (25 mg total) by mouth 2 (two) times daily.  Indication: Migraine Headache   traZODone 50 MG tablet Commonly known as: DESYREL Take 1 tablet (50 mg total) by mouth at bedtime as needed for sleep.  Indication: Trouble Sleeping, Major Depressive Disorder        Follow-up  Information     [address] [facility] [place] [employer]. Follow up.   Contact information: [address] [place] [address] [phone]                 Follow-up recommendations:  [facility] team    Signed: [name], DO 11/14/2022, 11:45 AM

## 2022-11-14 NOTE — [facility] Counselor (Addendum)
CSW notes that this Clinical research associate was informed by patient's nurse that patient tested negative for pregnancy on 11/08/2022.  CSW updated [facility].  Penni Homans, MSW, LCSW 11/14/2022 12:50 PM   CSW spoke with the patients mental health provider, [facility], about the patients upcoming discharge.   [address] informed that the patient is currently pregnant. CSW reports that she was unaware and will discus with treatment team staff for further information.  [facility] requests that discharge medication list be emailed once completed.  Penni Homans, MSW, LCSW 11/14/2022 12:18 PM

## 2022-11-14 NOTE — [facility] Suicide Risk Assessment (Signed)
[facility] Discharge Suicide Risk Assessment   Principal Problem: Major depressive disorder, recurrent episode, severe (HCC) Discharge Diagnoses: Principal Problem:   Major depressive disorder, recurrent episode, severe, without psychosis (HCC) Active Problems:   Intentional overdose (HCC)   Total Time spent with patient: 1 hour  Musculoskeletal: Strength & Muscle Tone: within normal limits Gait & Station: normal Patient leans: N/A  Psychiatric Specialty Exam  Presentation  General Appearance:  Disheveled; Casual  Eye Contact: Fair  Speech: Clear and Coherent  Speech Volume: Decreased  Handedness: Right   Mood and Affect  Mood: Anxious  Duration of Depression Symptoms: Greater than two weeks  Affect: Constricted   Thought Process  Thought Processes: Coherent  Descriptions of Associations:Intact  Orientation:Full (Time, Place and Person)  Thought Content:Rumination; Perseveration  History of Schizophrenia/Schizoaffective disorder:No  Duration of Psychotic Symptoms:No data recorded Hallucinations:No data recorded Ideas of Reference:None  Suicidal Thoughts:No data recorded Homicidal Thoughts:No data recorded  Sensorium  Memory: Immediate Fair; Remote Fair; Recent Fair  Judgment: Poor  Insight: Fair   Chartered certified accountant: Fair  Attention Span: Fair  Recall: [employer] of Knowledge: Fair  Language: Fair   Psychomotor Activity  Psychomotor Activity:No data recorded  Assets  Assets: Manufacturing systems engineer; Desire for Improvement; Housing   Sleep  Sleep:No data recorded  Blood pressure 111/71, pulse 71, temperature 98.4 F (36.9 C), temperature source Oral, resp. rate (!) 22, height 5\' 7"  (1.702 m), weight 125.2 kg, SpO2 97 %, unknown if currently breastfeeding. Body mass index is 43.23 kg/m.  Mental Status Per Nursing Assessment::   On Admission:  NA  Demographic Factors:  NA  Loss  Factors: NA  Historical Factors: NA  Risk Reduction Factors:   NA  Continued Clinical Symptoms:  Depression:   Anhedonia  Cognitive Features That Contribute To Risk:  None    Suicide Risk:  Mild:  Suicidal ideation of limited frequency, intensity, duration, and specificity.  There are no identifiable plans, no associated intent, mild dysphoria and related symptoms, good self-control (both objective and subjective assessment), few other risk factors, and identifiable protective factors, including available and accessible social support.    Plan Of Care/Follow-up recommendations: [facility] ACT   [name], DO 11/14/2022, 11:32 AM

## 2022-11-15 ENCOUNTER — Telehealth: Payer: Self-pay

## 2022-11-15 NOTE — Transitions of Care (Post Inpatient/ED Visit) (Signed)
   11/15/2022  [name] MRN: [id number] DOB: Jan 10, 1991  Today's TOC FU Call Status: Today's TOC FU Call Status:: Unsuccessul Call (1st Attempt) Unsuccessful Call (1st Attempt) Date: 11/15/22  Attempted to reach the patient regarding the most recent Inpatient/ED visit.  Follow Up Plan: Additional outreach attempts will be made to reach the patient to complete the Transitions of Care (Post Inpatient/ED visit) call.   Signature Karena Addison, LPN [facility] Nurse Health Advisor Direct Dial [phone]

## 2022-11-16 NOTE — Transitions of Care (Post Inpatient/ED Visit) (Signed)
   11/16/2022  [name] MRN: [id number] DOB: August 20, 1990  Today's TOC FU Call Status: Today's TOC FU Call Status:: Unsuccessful Call (2nd Attempt) Unsuccessful Call (1st Attempt) Date: 11/15/22 Unsuccessful Call (2nd Attempt) Date: 11/16/22  Attempted to reach the patient regarding the most recent Inpatient/ED visit.  Follow Up Plan: Additional outreach attempts will be made to reach the patient to complete the Transitions of Care (Post Inpatient/ED visit) call.   Signature Karena Addison, LPN [facility] Nurse Health Advisor Direct Dial [phone]

## 2022-11-18 ENCOUNTER — Ambulatory Visit: Payer: Medicaid Other

## 2022-11-21 ENCOUNTER — Ambulatory Visit: Payer: Medicaid Other | Admitting: Cardiology

## 2022-11-21 NOTE — Transitions of Care (Post Inpatient/ED Visit) (Signed)
   11/21/2022  [name] MRN: [id number] DOB: 1990/07/13  Today's TOC FU Call Status: Today's TOC FU Call Status:: Unsuccessful Call (3rd Attempt) Unsuccessful Call (1st Attempt) Date: 11/15/22 Unsuccessful Call (2nd Attempt) Date: 11/16/22 Unsuccessful Call (3rd Attempt) Date: 11/21/22  Attempted to reach the patient regarding the most recent Inpatient/ED visit.  Follow Up Plan: No further outreach attempts will be made at this time. We have been unable to contact the patient.  Signature Karena Addison, LPN [facility] Nurse Health Advisor Direct Dial [phone]

## 2022-11-24 ENCOUNTER — Ambulatory Visit: Payer: Medicaid Other

## 2022-11-28 ENCOUNTER — Other Ambulatory Visit: Payer: Self-pay

## 2022-11-28 ENCOUNTER — Emergency Department
Admission: EM | Admit: 2022-11-28 | Discharge: 2022-11-28 | Disposition: A | Discharge status: Home / Self Care | Payer: Medicaid Other | Attending: Emergency Medicine | Admitting: Emergency Medicine

## 2022-11-28 ENCOUNTER — Encounter: Payer: Self-pay | Admitting: Emergency Medicine

## 2022-11-28 DIAGNOSIS — G43909 Migraine, unspecified, not intractable, without status migrainosus: Secondary | ICD-10-CM

## 2022-11-28 DIAGNOSIS — R519 Headache, unspecified: Secondary | ICD-10-CM | POA: Diagnosis present

## 2022-11-28 MED ORDER — KETOROLAC TROMETHAMINE 30 MG/ML IJ SOLN
30.0000 mg | Freq: Once | INTRAMUSCULAR | Status: AC
  Administered 2022-11-28: 30 mg via INTRAVENOUS
  Filled 2022-11-28: qty 1

## 2022-11-28 MED ORDER — METOCLOPRAMIDE HCL 5 MG/ML IJ SOLN
20.0000 mg | Freq: Once | INTRAVENOUS | Status: AC
  Administered 2022-11-28: 20 mg via INTRAVENOUS
  Filled 2022-11-28: qty 4

## 2022-11-28 MED ORDER — DIPHENHYDRAMINE HCL 50 MG/ML IJ SOLN
25.0000 mg | Freq: Once | INTRAMUSCULAR | Status: AC
  Administered 2022-11-28: 25 mg via INTRAVENOUS
  Filled 2022-11-28: qty 1

## 2022-11-28 MED ORDER — SODIUM CHLORIDE 0.9 % IV SOLN: Freq: Once | INTRAVENOUS | Status: AC

## 2022-11-28 NOTE — ED Triage Notes (Signed)
Presents via EMS from home with headache  States she is currently being treat by neurologist for same

## 2022-11-28 NOTE — ED Provider Notes (Signed)
[facility] Provider Note    Event Date/Time   First MD Initiated Contact with Patient 11/28/22 1552     (approximate)   History   Headache   HPI  Melanie Bell is a 32 y.o. female who presents with complaints of headache.  Patient has a long history of migraine headaches, she reports she sees neurology for this.  Has been referred to headache specialist in [place].  Confirmed this via review of records, last saw neurology on July 11.  No fevers or chills, she reports this is consistent with prior headaches.  No neuro deficits.     Physical Exam   Triage Vital Signs: ED Triage Vitals  Encounter Vitals Group     BP 11/28/22 1553 127/80     Systolic BP Percentile --      Diastolic BP Percentile --      Pulse Rate 11/28/22 1553 81     Resp 11/28/22 1553 18     Temp 11/28/22 1553 98.3 F (36.8 C)     Temp Source 11/28/22 1553 Oral     SpO2 11/28/22 1553 99 %     Weight 11/28/22 1606 125.2 kg (276 lb 0.3 oz)     Height 11/28/22 1606 1.702 m (5\' 7" )     Head Circumference --      Peak Flow --      Pain Score 11/28/22 1553 10     Pain Loc --      Pain Education --      Exclude from Growth Chart --     Most recent vital signs: Vitals:   11/28/22 1553  BP: 127/80  Pulse: 81  Resp: 18  Temp: 98.3 F (36.8 C)  SpO2: 99%     General: Awake, no distress.  CV:  Good peripheral perfusion.  Resp:  Normal effort.  Abd:  No distention.  Other:  Normal neurologic exam, cranial nerves II to XII are normal, normal strength in the upper and lower extremities.  PERRLA, EOMI   ED Results / Procedures / Treatments   Labs (all labs ordered are listed, but only abnormal results are displayed) Labs Reviewed - No data to display   EKG     RADIOLOGY     PROCEDURES:  Critical Care performed:   Procedures   MEDICATIONS ORDERED IN ED: Medications  ketorolac (TORADOL) 30 MG/ML injection 30 mg (30 mg Intravenous Given 11/28/22  1602)  metoCLOPramide (REGLAN) 20 mg in dextrose 5 % 50 mL IVPB (20 mg Intravenous New Bag/Given 11/28/22 1713)  0.9 %  sodium chloride infusion ( Intravenous New Bag/Given 11/28/22 1602)  diphenhydrAMINE (BENADRYL) injection 25 mg (25 mg Intravenous Given 11/28/22 1603)     IMPRESSION / MDM / ASSESSMENT AND PLAN / ED COURSE  I reviewed the triage vital signs and the nursing notes. Patient's presentation is most consistent with exacerbation of chronic illness.  Patient reports globalized throbbing headache in keeping with prior migraine headaches.  No red flag symptoms, no fever, no neurodeficits.  Will treat with headache cocktail including IV Benadryl, IV Reglan, IV fluids, IV Toradol  No indication for imaging at this time  ----------------------------------------- 5:32 PM on 11/28/2022 ----------------------------------------- Patient feeling much improved after treatment      FINAL CLINICAL IMPRESSION(S) / ED DIAGNOSES   Final diagnoses:  Migraine without status migrainosus, not intractable, unspecified migraine type     Rx / DC Orders   ED Discharge Orders     None  Note:  This document was prepared using Dragon voice recognition software and may include unintentional dictation errors.   Jene Every, MD 11/28/22 [phone]

## 2022-11-29 ENCOUNTER — Telehealth: Payer: Self-pay

## 2022-11-29 ENCOUNTER — Ambulatory Visit: Payer: Medicaid Other

## 2022-11-29 NOTE — Transitions of Care (Post Inpatient/ED Visit) (Signed)
11/29/2022  [name] MRN: [id number] DOB: [birth year]  Today's TOC FU Call Status: Today's TOC FU Call Status:: Successful TOC FU Call Competed TOC FU Call Complete Date: 11/29/22  Transition Care Management Follow-up Telephone Call Date of Discharge: 11/28/22 Discharge Facility: [facility]) Type of Discharge: Emergency Department Reason for ED Visit: Other: (Migraine without status migrainosus, not intractable) How have you been since you were released from the hospital?: Better Any questions or concerns?: No  Items Reviewed: Did you receive and understand the discharge instructions provided?: Yes Medications obtained,verified, and reconciled?: Yes (Medications Reviewed) Any new allergies since your discharge?: No Dietary orders reviewed?: Yes Do you have support at home?: Yes  Medications Reviewed Today: Medications Reviewed Today     Reviewed by Merleen Nicely, LPN (Licensed Practical Nurse) on 11/29/22 at 1218  Med List Status: <None>   Medication Order Taking? Sig Documenting Provider Last Dose Status Informant  albuterol (VENTOLIN HFA) 108 (90 Base) MCG/ACT inhaler [id number] Yes 2 puffs four times a day as needed [name], MD Taking Active Self, Pharmacy Records, Multiple Informants, Other  ARIPiprazole (ABILIFY) 5 MG tablet [id number] Yes Take 1 tablet (5 mg total) by mouth daily. [name], DO Taking Active   cabergoline (DOSTINEX) 0.5 MG tablet [id number] Yes Take 1 tablet (0.5 mg total) by mouth once a week. [name], DO Taking Active   cetirizine (ZYRTEC) 10 MG tablet [id number]  Take 4 tablets (40 mg total) by mouth daily for 5 days.  Patient not taking: Reported on 09/28/2022   Racheal Patches, PA-C  Expired 07/30/22 2359   escitalopram (LEXAPRO) 10 MG tablet [id number] Yes Take 1 tablet (10 mg total) by mouth daily. [name], DO Taking Active   furosemide (LASIX) 20  MG tablet [id number] Yes Take 1 tablet (20 mg total) by mouth daily. [name], DO Taking Active   hydrOXYzine (VISTARIL) 50 MG capsule [id number] Yes Take 50 mg by mouth 2 (two) times daily as needed. [provider] Taking Active Self, Pharmacy Records, Multiple Informants, Other  levothyroxine (SYNTHROID) 75 MCG tablet [id number] Yes Take 1 tablet (75 mcg total) by mouth daily before breakfast. [name], DO Taking Active   lithium carbonate 300 MG capsule [id number] Yes Take 1 capsule (300 mg total) by mouth 2 (two) times daily with a meal. [name], DO Taking Active   meloxicam (MOBIC) 15 MG tablet [id number] Yes Take 1 tablet (15 mg total) by mouth daily. Varney Daily, PA Taking Active Pharmacy Records, Multiple Informants, Other           Med Note Nelia Shi   Tue Nov 08, 2022  3:16 PM) 08/08/2022 15 MG TABS (disp 28, 28d supply)  omeprazole (PRILOSEC) 20 MG capsule [id number] Yes TAKE 1 CAPSULE (20 MG TOTAL) BY MOUTH DAILY. [name], MD Taking Active Pharmacy Records, Multiple Informants, Other  potassium chloride SA (KLOR-CON M) 10 MEQ tablet [id number] Yes Take 1 tablet (10 mEq total) by mouth daily. [name], DO Taking Active   prazosin (MINIPRESS) 2 MG capsule [id number] Yes Take 1 capsule (2 mg total) by mouth at bedtime. [name], DO Taking Active   SUMAtriptan [facility]) 20 MG/ACT nasal spray [id number] Yes Place 20 mg into the nose as needed. [provider] Taking Active Self, Pharmacy Records, Multiple Informants, Other  topiramate (TOPAMAX) 25 MG tablet [id number] Yes Take 1 tablet (25 mg total) by  mouth 2 (two) times daily. [name], DO Taking Active   traZODone (DESYREL) 50 MG tablet [id number] Yes Take 1 tablet (50 mg total) by mouth at bedtime as needed for sleep. [name], DO Taking Active             Home Care and Equipment/Supplies: Were  Home Health Services Ordered?: NA Any new equipment or medical supplies ordered?: NA  Functional Questionnaire: Do you need assistance with bathing/showering or dressing?: No Do you need assistance with meal preparation?: No Do you need assistance with eating?: No Do you have difficulty maintaining continence: No Do you need assistance with getting out of bed/getting out of a chair/moving?: No Do you have difficulty managing or taking your medications?: No  Follow up appointments reviewed: PCP Follow-up appointment confirmed?: No MD Provider Line Number:[phone] Given: Yes Specialist Hospital Follow-up appointment confirmed?: Yes Date of Specialist follow-up appointment?: 12/22/22 Follow-Up Specialty Provider:: headache specialist Do you need transportation to your follow-up appointment?: No Do you understand care options if your condition(s) worsen?: Yes-patient verbalized understanding    SIGNATURE  Woodfin Ganja LPN [facility] Nurse Health Advisor Direct Dial [phone]

## 2022-11-30 ENCOUNTER — Other Ambulatory Visit: Payer: Self-pay | Admitting: Student

## 2022-11-30 DIAGNOSIS — H93A3 Pulsatile tinnitus, bilateral: Secondary | ICD-10-CM

## 2022-12-01 ENCOUNTER — Ambulatory Visit: Payer: Medicaid Other

## 2022-12-06 ENCOUNTER — Ambulatory Visit: Payer: Medicaid Other

## 2022-12-08 ENCOUNTER — Other Ambulatory Visit: Payer: Self-pay

## 2022-12-08 ENCOUNTER — Ambulatory Visit: Payer: Medicaid Other

## 2022-12-08 ENCOUNTER — Emergency Department: Payer: Medicaid Other

## 2022-12-08 ENCOUNTER — Emergency Department
Admission: EM | Admit: 2022-12-08 | Discharge: 2022-12-08 | Disposition: A | Discharge status: Home / Self Care | Payer: Medicaid Other | Attending: Emergency Medicine | Admitting: Emergency Medicine

## 2022-12-08 DIAGNOSIS — D72829 Elevated white blood cell count, unspecified: Secondary | ICD-10-CM | POA: Diagnosis not present

## 2022-12-08 DIAGNOSIS — R519 Headache, unspecified: Secondary | ICD-10-CM | POA: Diagnosis present

## 2022-12-08 DIAGNOSIS — I10 Essential (primary) hypertension: Secondary | ICD-10-CM | POA: Diagnosis not present

## 2022-12-08 DIAGNOSIS — G43901 Migraine, unspecified, not intractable, with status migrainosus: Secondary | ICD-10-CM | POA: Diagnosis not present

## 2022-12-08 LAB — COMPREHENSIVE METABOLIC PANEL
ALT: 11 U/L (ref 0–44)
AST: 15 U/L (ref 15–41)
Albumin: 4.1 g/dL (ref 3.5–5.0)
Alkaline Phosphatase: 65 U/L (ref 38–126)
Anion gap: 7 (ref 5–15)
BUN: 14 mg/dL (ref 6–20)
CO2: 25 mmol/L (ref 22–32)
Calcium: 9.5 mg/dL (ref 8.9–10.3)
Chloride: 107 mmol/L (ref 98–111)
Creatinine, Ser: 0.71 mg/dL (ref 0.44–1.00)
GFR, Estimated: >60 mL/min (ref >60)
Glucose, Bld: 89 mg/dL (ref 70–99)
Potassium: 4 mmol/L (ref 3.5–5.1)
Sodium: 139 mmol/L (ref 135–145)
Total Bilirubin: 0.5 mg/dL (ref 0.3–1.2)
Total Protein: 7.7 g/dL (ref 6.5–8.1)

## 2022-12-08 LAB — CBC WITH DIFFERENTIAL/PLATELET
Abs Immature Granulocytes: 0.03 10*3/uL (ref 0.00–0.07)
Basophils Absolute: 0 10*3/uL (ref 0.0–0.1)
Basophils Relative: 0 %
Eosinophils Absolute: 0.1 10*3/uL (ref 0.0–0.5)
Eosinophils Relative: 1 %
HCT: 42.5 % (ref 36.0–46.0)
Hemoglobin: 14.3 g/dL (ref 12.0–15.0)
Immature Granulocytes: 0 %
Lymphocytes Relative: 17 %
Lymphs Abs: 1.9 10*3/uL (ref 0.7–4.0)
MCH: 30.6 pg (ref 26.0–34.0)
MCHC: 33.6 g/dL (ref 30.0–36.0)
MCV: 91 fL (ref 80.0–100.0)
Monocytes Absolute: 0.7 10*3/uL (ref 0.1–1.0)
Monocytes Relative: 6 %
Neutro Abs: 8.2 10*3/uL — ABNORMAL HIGH (ref 1.7–7.7)
Neutrophils Relative %: 76 %
Platelets: 232 10*3/uL (ref 150–400)
RBC: 4.67 MIL/uL (ref 3.87–5.11)
RDW: 12.8 % (ref 11.5–15.5)
WBC: 10.8 10*3/uL — ABNORMAL HIGH (ref 4.0–10.5)
nRBC: 0 % (ref 0.0–0.2)

## 2022-12-08 LAB — POC URINE PREG, ED
Preg Test, Ur: NEGATIVE
Preg Test, Ur: NEGATIVE

## 2022-12-08 MED ORDER — KETOROLAC TROMETHAMINE 30 MG/ML IJ SOLN
30.0000 mg | Freq: Once | INTRAMUSCULAR | Status: AC
  Administered 2022-12-08: 30 mg via INTRAVENOUS
  Filled 2022-12-08: qty 1

## 2022-12-08 MED ORDER — SODIUM CHLORIDE 0.9 % IV BOLUS
1000.0000 mL | Freq: Once | INTRAVENOUS | Status: AC
  Administered 2022-12-08: 1000 mL via INTRAVENOUS

## 2022-12-08 MED ORDER — METOCLOPRAMIDE HCL 5 MG/ML IJ SOLN
10.0000 mg | Freq: Once | INTRAMUSCULAR | Status: AC
  Administered 2022-12-08: 10 mg via INTRAVENOUS
  Filled 2022-12-08: qty 2

## 2022-12-08 NOTE — Discharge Instructions (Signed)
Please follow-up with neurology.  You can continue to take your normal medications for migraine.  You can return to the ED with any new or worsening symptoms.

## 2022-12-08 NOTE — ED Triage Notes (Signed)
Pt here via [facility] with headache x1 hour. Pt has a hx of migraines, takes Imitrex and Topamax. Pt alert, oriented, and ambulatory.

## 2022-12-08 NOTE — ED Provider Notes (Signed)
[facility] Provider Note    Event Date/Time   First MD Initiated Contact with Patient 12/08/22 1424     (approximate)   History   Migraine   HPI  Melanie Bell is a 32 y.o. female with PMH of migraines presents for evaluation of a migraine.  Patient states she took Imitrex earlier this morning and that did not help.  She states she has a severe headache all over her head and nausea.  She reported some blurry vision prior to headache onset.  She also mentions some left-sided facial twitching and now states that the left side of her face is numb.  This has not occurred with previous migraines.  Patient is followed by a neurologist for treatment of her migraines and she states she scheduled for an MRV on Monday.      Physical Exam   Triage Vital Signs: ED Triage Vitals  Encounter Vitals Group     BP 12/08/22 1322 (!) 143/69     Systolic BP Percentile --      Diastolic BP Percentile --      Pulse Rate 12/08/22 1321 76     Resp 12/08/22 1321 17     Temp 12/08/22 1321 98 F (36.7 C)     Temp Source 12/08/22 1321 Oral     SpO2 12/08/22 1321 94 %     Weight 12/08/22 1321 274 lb (124.3 kg)     Height 12/08/22 1321 5\' 7"  (1.702 m)     Head Circumference --      Peak Flow --      Pain Score 12/08/22 1321 8     Pain Loc --      Pain Education --      Exclude from Growth Chart --     Most recent vital signs: Vitals:   12/08/22 1321 12/08/22 1322  BP:  (!) 143/69  Pulse: 76   Resp: 17   Temp: 98 F (36.7 C)   SpO2: 94%     General: Awake, mild distress.  Lights are off in the room. CV:  Good peripheral perfusion.  Resp:  Normal effort.  Abd:  No distention.  Other:  PERRL.  EOMI. cranial nerves II through XII grossly intact, mild left-sided facial weakness.   ED Results / Procedures / Treatments   Labs (all labs ordered are listed, but only abnormal results are displayed) Labs Reviewed  CBC WITH DIFFERENTIAL/PLATELET - Abnormal;  Notable for the following components:      Result Value   WBC 10.8 (*)    Neutro Abs 8.2 (*)    All other components within normal limits  COMPREHENSIVE METABOLIC PANEL  POC URINE PREG, ED    RADIOLOGY  Noncon head CT obtained to evaluate for patient's left-sided facial numbness and weakness.  I interpreted the images as well as reviewed the radiologist report.  PROCEDURES:  Critical Care performed: No  Procedures   MEDICATIONS ORDERED IN ED: Medications  sodium chloride 0.9 % bolus 1,000 mL (1,000 mLs Intravenous New Bag/Given 12/08/22 1517)  ketorolac (TORADOL) 30 MG/ML injection 30 mg (30 mg Intravenous Given 12/08/22 1542)  metoCLOPramide (REGLAN) injection 10 mg (10 mg Intravenous Given 12/08/22 1542)     IMPRESSION / MDM / ASSESSMENT AND PLAN / ED COURSE  I reviewed the triage vital signs and the nursing notes.  32 year old female presents for evaluation of migraine.  Hypertensive in triage otherwise VSS.  Patient in mild distress on exam.  Differential diagnosis includes, but is not limited to, migraine, hypocalcemia, intracranial hemorrhage.  Patient's presentation is most consistent with acute complicated illness / injury requiring diagnostic workup.  Patient reported facial numbness and when asked to perform different facial expressions she had some decreased movement on the left side.  I ordered a Noncon head CT to evaluate this.  I interpreted the images as well as reviewed the radiologist report which is negative for any acute intracranial pathology.  CBC and CMP unremarkable aside from a very mildly elevated WBC.  Patient reported an improvement in her symptoms after the fluids and medication.  Given patient's negative head CT and reassuring lab work I feel she is appropriate for outpatient management.  I advised patient to follow-up with her neurologist for her scheduled appointment on Monday and return to the ED if she has any new or  worsening symptoms.  Patient was agreeable to plan, voiced understanding and was stable at discharge.   FINAL CLINICAL IMPRESSION(S) / ED DIAGNOSES   Final diagnoses:  Migraine with status migrainosus, not intractable, unspecified migraine type     Rx / DC Orders   ED Discharge Orders     None        Note:  This document was prepared using Dragon voice recognition software and may include unintentional dictation errors.   Cameron Ali, PA-C 12/08/22 1651    Merwyn Katos, MD 12/08/22 [phone]

## 2022-12-08 NOTE — ED Triage Notes (Signed)
Pt sts that she has a migraine and nothing is working

## 2022-12-12 ENCOUNTER — Ambulatory Visit
Admission: RE | Admit: 2022-12-12 | Discharge: 2022-12-12 | Disposition: A | Discharge status: Home / Self Care | Payer: Medicaid Other | Source: Ambulatory Visit | Attending: Student | Admitting: Student

## 2022-12-12 ENCOUNTER — Telehealth: Payer: Self-pay

## 2022-12-12 DIAGNOSIS — H93A3 Pulsatile tinnitus, bilateral: Secondary | ICD-10-CM

## 2022-12-12 NOTE — Transitions of Care (Post Inpatient/ED Visit) (Signed)
   12/12/2022  [name] MRN: [id number] DOB: 1990/07/19  Today's TOC FU Call Status: Unsuccessful Call (1st Attempt) Date: 12/12/22  Attempted to reach the patient regarding the most recent Inpatient/ED visit.  Follow Up Plan: Additional outreach attempts will be made to reach the patient to complete the Transitions of Care (Post Inpatient/ED visit) call.    [facility]  Primary Care & Sports Medicine at [facility], [facility] [address]  [place] [address] Office [phone]  Fax: [phone]

## 2022-12-13 ENCOUNTER — Ambulatory Visit: Payer: Medicaid Other

## 2022-12-14 ENCOUNTER — Ambulatory Visit
Admission: EM | Admit: 2022-12-14 | Discharge: 2022-12-14 | Disposition: A | Discharge status: Home / Self Care | Payer: Medicaid Other | Attending: Family Medicine | Admitting: Family Medicine

## 2022-12-14 DIAGNOSIS — J4520 Mild intermittent asthma, uncomplicated: Secondary | ICD-10-CM | POA: Diagnosis present

## 2022-12-14 DIAGNOSIS — U071 COVID-19: Secondary | ICD-10-CM | POA: Diagnosis not present

## 2022-12-14 LAB — SARS CORONAVIRUS 2 BY RT PCR: SARS Coronavirus 2 by RT PCR: POSITIVE — AB

## 2022-12-14 MED ORDER — PAXLOVID (300/100) 20 X 150 MG & 10 X 100MG PO TBPK: 3.0000 | ORAL_TABLET | Freq: Two times a day (BID) | ORAL | 0 refills | Status: AC

## 2022-12-14 MED ORDER — ONDANSETRON HCL 4 MG/2ML IJ SOLN
4.0000 mg | Freq: Once | INTRAMUSCULAR | Status: AC
  Administered 2022-12-14: 4 mg via INTRAMUSCULAR

## 2022-12-14 MED ORDER — ALBUTEROL SULFATE HFA 108 (90 BASE) MCG/ACT IN AERS
INHALATION_SPRAY | RESPIRATORY_TRACT | 1 refills | Status: AC
Start: 2022-12-14 — End: ?

## 2022-12-14 MED ORDER — PREDNISONE 20 MG PO TABS: 40.0000 mg | ORAL_TABLET | Freq: Every day | ORAL | 0 refills | Status: AC

## 2022-12-14 MED ORDER — ONDANSETRON 4 MG PO TBDP: 4.0000 mg | ORAL_TABLET | Freq: Three times a day (TID) | ORAL | 0 refills | Status: DC | PRN

## 2022-12-14 NOTE — ED Triage Notes (Signed)
Tested positive yesterday. Sx started 2 days ago, sob + covid test. -congestion sore throat cough bodyaches has emesis 2 days

## 2022-12-14 NOTE — Discharge Instructions (Addendum)
Your test for COVID-19 was positive, meaning that you were infected with the novel coronavirus and could give the germ to others.  The recommendations suggest returning to normal activities when, for at least 24 hours, symptoms are improving overall, and if a fever was present, it has been gone without use of a fever-reducing medication.  You should wear a mask for the next 5 days to prevent the spread of disease. Please continue good preventive care measures, including:  frequent hand-washing, avoid touching your face, cover coughs/sneezes, stay out of crowds and keep a 6 foot distance from others.  Go to the nearest hospital emergency room if fever/cough/breathlessness are severe or illness seems like a threat to life.  If your were prescribed medication. Stop by the pharmacy to pick it up. You can take Tylenol and/or Ibuprofen as needed for fever reduction and pain relief.    For your asthma: use albuterol 4 puffs every 4 hours as needed. Continue your daily inhaler as prescribed. Take steroids as prescribed.   It is important to stay hydrated: drink plenty of fluids (water, gatorade/powerade/pedialyte, juices, or teas) to keep your throat moisturized and help further relieve irritation/discomfort.    Return or go to the Emergency Department if symptoms worsen or do not improve in the next few days

## 2022-12-14 NOTE — ED Provider Notes (Addendum)
[facility]    CSN: [id number] Arrival date & time: 12/14/22  0856      History   Chief Complaint Chief Complaint  Patient presents with   Covid Positive    HPI Melanie Bell is a 32 y.o. female.   HPI  History obtained from the patient. Melanie Bell presents after having positive COVID test yesterday.   Has vomiting, nausea, body aches, sore throat, cough. Denies diarrhea. Has shortness of breath and been using her albuterol inhaler and asthma medications.      Past Medical History:  Diagnosis Date   Allergy    Anxiety    Arthritis    Asthma    Depression    Foreign body aspiration    GERD (gastroesophageal reflux disease)    Hallucinations 09/30/2014   Sizoaffective   Hyperlipidemia    Hypertension    Hypoglycemia    Intentional ingestion of batteries 04/21/2016    2 AAA batteries and 1 thumb tack; intent to hurt herself/notes 04/21/2016   Self-inflicted injury 11/23/2017   Suicidal ideation 01/02/2018   Tardive dyskinesia 10/2014   recent onset   Thyroid disease     Patient Active Problem List   Diagnosis Date Noted   Major depressive disorder, recurrent episode, severe, without psychosis (HCC) 11/11/2022   Intentional overdose (HCC) 11/08/2022   Amenorrhea 09/05/2022   Possible pregnancy 09/05/2022   Symptomatic cholelithiasis 02/16/2022   Peritoneal adhesions without obstruction 02/16/2022   Hematemesis 01/14/2022   Encounter for supervision of other normal pregnancy, first trimester 11/12/2021   Morbid obesity (HCC) 09/30/2021   Acquired hypothyroidism 06/25/2021   Migraine without aura and without status migrainosus, not intractable 03/05/2021   Mild intermittent asthma without complication 12/01/2020   Gastroesophageal reflux disease 12/01/2020   Hypoglycemia 12/01/2020   Rheumatoid factor positive 12/09/2019   SS-A antibody positive 10/28/2019   Chronic pain of both knees 10/28/2019   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    Schizoaffective disorder, depressive type (HCC) 02/02/2018   Suicide attempt (HCC) 01/16/2018   Dysfunctional uterine bleeding 01/06/2018   Borderline personality disorder (HCC) 01/04/2018   PTSD (post-traumatic stress disorder) 01/03/2018   ASCUS of cervix with negative high risk HPV 08/28/2017   Malingering 05/06/2016    Past Surgical History:  Procedure Laterality Date   ABDOMINAL SURGERY     "years ago" to remove foreign objects   APPENDECTOMY     BREAST EXCISIONAL BIOPSY Right 09/03/2007   surgical bx removed fibroadeoma   BREAST LUMPECTOMY Right    CHOLECYSTECTOMY  02/2022   COLONOSCOPY WITH PROPOFOL N/A 09/10/2015   Procedure: COLONOSCOPY WITH PROPOFOL;  Surgeon: Christena Deem, MD;  Location: [facility] ENDOSCOPY;  Service: Endoscopy;  Laterality: N/A;   ESOPHAGOGASTRODUODENOSCOPY N/A 11/28/2014   Procedure: ESOPHAGOGASTRODUODENOSCOPY (EGD);  Surgeon: Scot Jun, MD;  Location: [facility] ENDOSCOPY;  Service: Endoscopy;  Laterality: N/A;   ESOPHAGOGASTRODUODENOSCOPY N/A 02/21/2016   Procedure: ESOPHAGOGASTRODUODENOSCOPY (EGD);  Surgeon: Napoleon Form, MD;  Location: [facility] ENDOSCOPY;  Service: Endoscopy;  Laterality: N/A;   ESOPHAGOGASTRODUODENOSCOPY N/A 04/21/2016   Procedure: ESOPHAGOGASTRODUODENOSCOPY (EGD);  Surgeon: Iva Boop, MD;  Location: [facility] ENDOSCOPY;  Service: Endoscopy;  Laterality: N/A;   ESOPHAGOGASTRODUODENOSCOPY N/A 05/06/2016   Procedure: ESOPHAGOGASTRODUODENOSCOPY (EGD);  Surgeon: Wyline Mood, MD;  Location: [facility] ENDOSCOPY;  Service: Gastroenterology;  Laterality: N/A;   ESOPHAGOGASTRODUODENOSCOPY N/A 06/13/2016   Procedure: ESOPHAGOGASTRODUODENOSCOPY (EGD);  Surgeon: Midge Minium, MD;  Location: [facility] ENDOSCOPY;  Service: Endoscopy;  Laterality: N/A;   ESOPHAGOGASTRODUODENOSCOPY N/A 01/14/2022   Procedure: ESOPHAGOGASTRODUODENOSCOPY (  EGD);  Surgeon: Toledo, Boykin Nearing, MD;  Location: [facility] ENDOSCOPY;  Service: Gastroenterology;  Laterality: N/A;    ESOPHAGOGASTRODUODENOSCOPY (EGD) WITH PROPOFOL N/A 02/29/2016   Procedure: ESOPHAGOGASTRODUODENOSCOPY (EGD) WITH PROPOFOL;  Surgeon: Midge Minium, MD;  Location: [facility] ENDOSCOPY;  Service: Endoscopy;  Laterality: N/A;   ESOPHAGOGASTRODUODENOSCOPY (EGD) WITH PROPOFOL N/A 01/15/2018   Procedure: ESOPHAGOGASTRODUODENOSCOPY (EGD) WITH PROPOFOL;  Surgeon: Corbin Ade, MD;  Location: [facility] ENDO SUITE;  Service: Endoscopy;  Laterality: N/A;  retrieval forein body   LAPAROSCOPIC LYSIS OF ADHESIONS  02/16/2022   Procedure: LAPAROSCOPIC LYSIS OF ADHESIONS;  Surgeon: Henrene Dodge, MD;  Location: [facility] ORS;  Service: General;;   LAPAROTOMY N/A 09/12/2015   Procedure: EXPLORATORY LAPAROTOMY;  Surgeon: Lattie Haw, MD;  Location: [facility] ORS;  Service: General;  Laterality: N/A;   SIGMOIDOSCOPY N/A 09/12/2015   Procedure: Daiva Huge;  Surgeon: Lattie Haw, MD;  Location: [facility] ORS;  Service: General;  Laterality: N/A;   WISDOM TOOTH EXTRACTION      OB History     Gravida  5   Para  0   Term  0   Preterm  0   AB  3   Living  0      SAB  3   IAB  0   Ectopic  0   Multiple  0   Live Births               Home Medications    Prior to Admission medications   Medication Sig Start Date End Date Taking? Authorizing Provider  ARIPiprazole (ABILIFY) 5 MG tablet Take 1 tablet (5 mg total) by mouth daily. 11/14/22  Yes [name], DO  cabergoline (DOSTINEX) 0.5 MG tablet Take 1 tablet (0.5 mg total) by mouth once a week. 11/14/22  Yes [name], DO  escitalopram (LEXAPRO) 10 MG tablet Take 1 tablet (10 mg total) by mouth daily. 11/14/22  Yes [name], DO  furosemide (LASIX) 20 MG tablet Take 1 tablet (20 mg total) by mouth daily. 11/15/22  Yes [name], DO  hydrOXYzine (VISTARIL) 50 MG capsule Take 50 mg by mouth 2 (two) times daily as needed. 08/20/19  Yes [provider]  levothyroxine (SYNTHROID) 75 MCG tablet Take 1 tablet  (75 mcg total) by mouth daily before breakfast. 11/14/22  Yes [name], DO  lithium carbonate 300 MG capsule Take 1 capsule (300 mg total) by mouth 2 (two) times daily with a meal. 11/14/22  Yes [name], DO  meloxicam (MOBIC) 15 MG tablet Take 1 tablet (15 mg total) by mouth daily. 06/21/22 06/21/23 Yes Varney Daily, PA  nirmatrelvir & ritonavir (PAXLOVID, 300/100,) 20 x 150 MG & 10 x 100MG  TBPK Take 3 tablets by mouth 2 (two) times daily for 5 days. 12/14/22 12/19/22 Yes , , DO  omeprazole (PRILOSEC) 20 MG capsule TAKE 1 CAPSULE (20 MG TOTAL) BY MOUTH DAILY. 06/07/22  Yes [name], MD  ondansetron (ZOFRAN-ODT) 4 MG disintegrating tablet Take 1 tablet (4 mg total) by mouth every 8 (eight) hours as needed. 12/14/22  Yes , , DO  potassium chloride SA (KLOR-CON M) 10 MEQ tablet Take 1 tablet (10 mEq total) by mouth daily. 11/15/22  Yes [name], DO  predniSONE (DELTASONE) 20 MG tablet Take 2 tablets (40 mg total) by mouth daily for 5 days. 12/14/22 12/19/22 Yes , Seward Meth, DO  SUMAtriptan (IMITREX) 20 MG/ACT nasal spray Place 20 mg into the nose as  needed. 12/06/21  Yes [provider]  topiramate (TOPAMAX) 25 MG tablet Take 1 tablet (25 mg total) by mouth 2 (two) times daily. 11/14/22  Yes [name], DO  traZODone (DESYREL) 50 MG tablet Take 1 tablet (50 mg total) by mouth at bedtime as needed for sleep. 11/14/22  Yes [name], DO  albuterol (VENTOLIN HFA) 108 (90 Base) MCG/ACT inhaler 2 puffs four times a day as needed 12/14/22   [name], DO  cetirizine (ZYRTEC) 10 MG tablet Take 4 tablets (40 mg total) by mouth daily for 5 days. Patient not taking: Reported on 09/28/2022 07/25/22 07/30/22  Cuthriell, Delorise Royals, PA-C    Family History Family History  Problem Relation Age of Onset   Depression Mother    Hypertension Mother    Sleep apnea Mother    Asthma Mother    COPD Mother     Diabetes Mother    Anxiety disorder Mother    Arthritis Mother    Heart failure Mother    Breast cancer Maternal Aunt        20's   Stroke Maternal Grandmother    Heart attack Maternal Grandfather     Social History Social History   Tobacco Use   Smoking status: Former    Current packs/day: 0.00    Average packs/day: 0.5 packs/day for 3.0 years (1.5 ttl pk-yrs)    Types: Cigarettes    Start date: 08/14/2013    Quit date: 08/14/2016    Years since quitting: 6.3    Passive exposure: Past   Smokeless tobacco: Never  Vaping Use   Vaping status: Never Used  Substance Use Topics   Alcohol use: No   Drug use: No     Allergies   Betadine [povidone iodine], Fish allergy, and Shellfish-derived products   Review of Systems Review of Systems: negative unless otherwise stated in HPI.      Physical Exam Triage Vital Signs ED Triage Vitals  Encounter Vitals Group     BP 12/14/22 0912 104/64     Systolic BP Percentile --      Diastolic BP Percentile --      Pulse Rate 12/14/22 0912 71     Resp 12/14/22 0912 19     Temp 12/14/22 0912 98.4 F (36.9 C)     Temp Source 12/14/22 0912 Oral     SpO2 12/14/22 0912 97 %     Weight 12/14/22 0910 270 lb (122.5 kg)     Height --      Head Circumference --      Peak Flow --      Pain Score 12/14/22 0910 7     Pain Loc --      Pain Education --      Exclude from Growth Chart --    No data found.  Updated Vital Signs BP 104/64 (BP Location: Left Arm)   Pulse 71   Temp 98.4 F (36.9 C) (Oral)   Resp 19   Wt 122.5 kg   LMP 10/26/2022 (Approximate)   SpO2 97%   BMI 42.29 kg/m   Visual Acuity Right Eye Distance:   Left Eye Distance:   Bilateral Distance:    Right Eye Near:   Left Eye Near:    Bilateral Near:     Physical Exam GEN:     alert, ill appearing female  HENT:  mucus membranes moist, clear nasal discharge EYES:   pupils equal and reactive, no scleral injection or discharge  NECK:  normal ROM, no meningismus    RESP:  no increased work of breathing,  faint expiratory wheezing CVS:   regular rate and rhythm Skin:   warm and dry    UC Treatments / Results  Labs (all labs ordered are listed, but only abnormal results are displayed) Labs Reviewed  SARS CORONAVIRUS 2 BY RT PCR - Abnormal; Notable for the following components:      Result Value   SARS Coronavirus 2 by RT PCR POSITIVE (*)    All other components within normal limits    EKG   Radiology No results found.  Procedures Procedures (including critical care time)  Medications Ordered in UC Medications  ondansetron (ZOFRAN) injection 4 mg (4 mg Intramuscular Given 12/14/22 0952)    Initial Impression / Assessment and Plan / UC Course  I have reviewed the triage vital signs and the nursing notes.  Pertinent labs & imaging results that were available during my care of the patient were reviewed by me and considered in my medical decision making (see chart for details).       Pt is a 32 y.o. female with history of asthma who presents for 1-2 days of respiratory symptoms. Melanie Bell is afebrile here though had recent antipyretics. Satting well on room air. Overall pt is ill-appearing, well hydrated, without respiratory distress. Pulmonary exam is remarkable for faint expiratory wheezing.  COVID testing obtained and is positive. Pt requesting antivirals. Serum  creatine was normal a couple weeks ago.  Risks and benefits discussed.  Start Paxlovid as below.  Discussed symptomatic treatment.  Isolation and quarantine precautions given.  Typical duration of symptoms discussed. Prescribed prednisone and albuterol for likely COVID induced asthma flare, zofran for nausea and vomiting.   Return and ED precautions given and voiced understanding. Discussed MDM, treatment plan and plan for follow-up with patient who agrees with plan.     Final Clinical Impressions(s) / UC Diagnoses   Final diagnoses:  COVID-19     Discharge Instructions       Your test for COVID-19 was positive, meaning that you were infected with the novel coronavirus and could give the germ to others.  The recommendations suggest returning to normal activities when, for at least 24 hours, symptoms are improving overall, and if a fever was present, it has been gone without use of a fever-reducing medication.  You should wear a mask for the next 5 days to prevent the spread of disease. Please continue good preventive care measures, including:  frequent hand-washing, avoid touching your face, cover coughs/sneezes, stay out of crowds and keep a 6 foot distance from others.  Go to the nearest hospital emergency room if fever/cough/breathlessness are severe or illness seems like a threat to life.  If your were prescribed medication. Stop by the pharmacy to pick it up. You can take Tylenol and/or Ibuprofen as needed for fever reduction and pain relief.    For your asthma: use albuterol 4 puffs every 4 hours as needed. Continue your daily inhaler as prescribed. Take steroids as prescribed.   It is important to stay hydrated: drink plenty of fluids (water, gatorade/powerade/pedialyte, juices, or teas) to keep your throat moisturized and help further relieve irritation/discomfort.    Return or go to the Emergency Department if symptoms worsen or do not improve in the next few days       ED Prescriptions     Medication Sig Dispense Auth. Provider   nirmatrelvir & ritonavir (PAXLOVID, 300/100,) 20 x 150  MG & 10 x 100MG  TBPK Take 3 tablets by mouth 2 (two) times daily for 5 days. 30 tablet , , DO   ondansetron (ZOFRAN-ODT) 4 MG disintegrating tablet Take 1 tablet (4 mg total) by mouth every 8 (eight) hours as needed. 20 tablet , , DO   predniSONE (DELTASONE) 20 MG tablet Take 2 tablets (40 mg total) by mouth daily for 5 days. 10 tablet , , DO   albuterol (VENTOLIN HFA) 108 (90 Base) MCG/ACT inhaler 2 puffs four times a day as  needed 18 g , , DO      PDMP not reviewed this encounter.   [name], DO 12/17/22 0749    [name], DO 12/17/22 [phone]

## 2022-12-15 ENCOUNTER — Ambulatory Visit: Payer: Medicaid Other

## 2022-12-19 NOTE — Transitions of Care (Post Inpatient/ED Visit) (Unsigned)
   12/19/2022  Name: Marliss Mareno MRN: [id number] DOB: December 08, 1990  Today's TOC FU Call Status: Unsuccessful Call (1st Attempt) Date: 12/12/22 Unsuccessful Call (2nd Attempt) Date: 12/19/22  Attempted to reach the patient regarding the most recent Inpatient/ED visit.  Follow Up Plan: Additional outreach attempts will be made to reach the patient to complete the Transitions of Care (Post Inpatient/ED visit) call.    [facility]  Primary Care & Sports Medicine at [facility], [facility] [address]  [place] [address] Office [phone]  Fax: [phone]

## 2022-12-20 ENCOUNTER — Ambulatory Visit: Payer: Medicaid Other

## 2022-12-20 ENCOUNTER — Telehealth: Payer: Self-pay

## 2022-12-20 NOTE — Transitions of Care (Post Inpatient/ED Visit) (Signed)
   12/20/2022  [name] MRN: [id number] DOB: 09/01/1990  Today's TOC FU Call Status: Today's TOC FU Call Status:: Successful TOC FU Call Completed TOC FU Call Complete Date: 12/20/22  Transition Care Management Follow-up Telephone Call Date of Discharge: 12/18/22 Discharge Facility: Other (Non-[facility] Facility) Type of Discharge: Emergency Department Reason for ED Visit: Respiratory How have you been since you were released from the hospital?: Better Any questions or concerns?: No  Items Reviewed: Did you receive and understand the discharge instructions provided?: Yes Medications obtained,verified, and reconciled?: Yes (Medications Reviewed) Any new allergies since your discharge?: No Dietary orders reviewed?: NA Do you have support at home?: Yes People in Home: spouse  Medications Reviewed Today: Medications Reviewed Today   Medications were not reviewed in this encounter     Home Care and Equipment/Supplies: Were Home Health Services Ordered?: No Any new equipment or medical supplies ordered?: No  Functional Questionnaire: Do you need assistance with bathing/showering or dressing?: No Do you need assistance with meal preparation?: No Do you need assistance with eating?: No Do you have difficulty maintaining continence: No Do you need assistance with getting out of bed/getting out of a chair/moving?: No Do you have difficulty managing or taking your medications?: No  Follow up appointments reviewed: PCP Follow-up appointment confirmed?: Yes Date of PCP follow-up appointment?: 01/11/23 Specialist Hospital Follow-up appointment confirmed?: No Do you need transportation to your follow-up appointment?: No Do you understand care options if your condition(s) worsen?: Yes-patient verbalized understanding    SIGNATURE TB, Cma

## 2022-12-21 NOTE — Transitions of Care (Post Inpatient/ED Visit) (Signed)
   12/21/2022  [name] MRN: [id number] DOB: Oct 25, 1990  Today's TOC FU Call Status: Today's TOC FU Call Status:: Unsuccessful Call (3rd Attempt) Unsuccessful Call (1st Attempt) Date: 12/12/22 Unsuccessful Call (2nd Attempt) Date: 12/19/22 Unsuccessful Call (3rd Attempt) Date: 12/21/22  Attempted to reach the patient regarding the most recent Inpatient/ED visit.  Follow Up Plan: No further outreach attempts will be made at this time. We have been unable to contact the patient.   [facility]  Primary Care & Sports Medicine at [facility], [facility] [address]  [place] [address] Office [phone]  Fax: [phone]

## 2022-12-22 ENCOUNTER — Ambulatory Visit: Payer: Medicaid Other

## 2022-12-27 ENCOUNTER — Encounter: Payer: Self-pay | Admitting: Obstetrics and Gynecology

## 2022-12-27 ENCOUNTER — Other Ambulatory Visit: Payer: Medicaid Other | Admitting: Obstetrics and Gynecology

## 2022-12-27 ENCOUNTER — Ambulatory Visit: Payer: Medicaid Other

## 2022-12-27 NOTE — Patient Outreach (Signed)
Medicaid Managed Care   Nurse Care Manager Note  12/27/2022 Name:  Melanie Bell MRN:  [id number] DOB:  December 28, 1990  Melanie Bell is an 32 y.o. year old female who is a primary patient of [name], [name], MD.  The [facility] Managed Care Coordination team was consulted for assistance with:    Chronic healthcare management needs, asthma, osteoarthritis, migraines, GERD, PTSD, HTN, preDM, HLD, BP, schizoaffective disorder,  chronic pain, hypothyroid, h/o seizures, anxiety/depression  [name] was given information about Medicaid Managed Care Coordination team services today. Sylvie Farrier Klimas Patient agreed to services and verbal consent obtained.  Engaged with patient by telephone for follow up visit in response to provider referral for case management and/or care coordination services.   Assessments/Interventions:  Review of past medical history, allergies, medications, health status, including review of consultants reports, laboratory and other test data, was performed as part of comprehensive evaluation and provision of chronic care management services.  SDOH (Social Determinants of Health) assessments and interventions performed: SDOH Interventions    Flowsheet Row Patient Outreach Telephone from 12/27/2022 in [place] [facility] DEPARTMENT Patient Outreach Telephone from 10/27/2022 in [place] POPULATION HEALTH DEPARTMENT Patient Outreach Telephone from 09/21/2022 in [place] POPULATION HEALTH DEPARTMENT Patient Outreach Telephone from 08/22/2022 in  POPULATION HEALTH DEPARTMENT Office Visit from 06/20/2022 in [facility] at [facility] Clinical Support from 01/05/2022 in [facility] at [facility]  SDOH Interventions        Food Insecurity Interventions -- Intervention Not Indicated -- -- -- --  Housing Interventions -- Intervention Not Indicated -- -- -- Intervention Not Indicated   Transportation Interventions -- -- Intervention Not Indicated  [[name]] -- -- Intervention Not Indicated  Alcohol Usage Interventions -- -- -- Intervention Not Indicated (Score <7) -- --  Depression Interventions/Treatment  -- -- -- -- Counseling Counseling  Physical Activity Interventions Intervention Not Indicated -- -- -- -- Intervention Not Indicated  Stress Interventions -- -- -- Intervention Not Indicated -- Intervention Not Indicated  Social Connections Interventions -- -- -- -- -- Intervention Not Indicated  Health Literacy Interventions Intervention Not Indicated -- -- -- -- --     Care Plan Allergies  Allergen Reactions   Betadine [Povidone Iodine] Other (See Comments)    Reaction:  Unknown    Fish Allergy Hives   Shellfish-Derived Products Other (See Comments)    Reaction:  Unknown     Medications Reviewed Today     Reviewed by Danie Chandler, RN (Registered Nurse) on 12/27/22 at 0919  Med List Status: <None>   Medication Order Taking? Sig Documenting Provider Last Dose Status Informant  albuterol (VENTOLIN HFA) 108 (90 Base) MCG/ACT inhaler [id number]  2 puffs four times a day as needed Brimage, Vondra, DO  Active   ARIPiprazole (ABILIFY) 5 MG tablet [id number] No Take 1 tablet (5 mg total) by mouth daily. [name], DO 12/14/2022 Active   cabergoline (DOSTINEX) 0.5 MG tablet [id number] No Take 1 tablet (0.5 mg total) by mouth once a week. [name], DO 12/14/2022 Active   cetirizine (ZYRTEC) 10 MG tablet [id number] No Take 4 tablets (40 mg total) by mouth daily for 5 days.  Patient not taking: Reported on 09/28/2022   Racheal Patches, PA-C Not Taking Expired 07/30/22 2359   escitalopram (LEXAPRO) 10 MG tablet [id number] No Take 1 tablet (10 mg total) by mouth daily. [name], DO 12/14/2022  Active   furosemide (LASIX) 20 MG tablet [id number] No Take 1 tablet (20 mg total) by mouth daily. [name], DO 12/14/2022  Active   hydrOXYzine (VISTARIL) 50 MG capsule [id number] No Take 50 mg by mouth 2 (two) times daily as needed. [provider] 12/14/2022 Active Self, Pharmacy Records, Multiple Informants, Other  levothyroxine (SYNTHROID) 75 MCG tablet [id number] No Take 1 tablet (75 mcg total) by mouth daily before breakfast. [name], DO 12/14/2022 Active   lithium carbonate 300 MG capsule [id number] No Take 1 capsule (300 mg total) by mouth 2 (two) times daily with a meal. [name], DO 12/14/2022 Active   meloxicam (MOBIC) 15 MG tablet [id number] No Take 1 tablet (15 mg total) by mouth daily. Varney Daily, [place] 12/14/2022 Active Pharmacy Records, Multiple Informants, Other           Med Note Nelia Shi   Tue Nov 08, 2022  3:16 PM) 08/08/2022 15 MG TABS (disp 28, 28d supply)  omeprazole (PRILOSEC) 20 MG capsule [id number] No TAKE 1 CAPSULE (20 MG TOTAL) BY MOUTH DAILY. [name], MD 12/14/2022 Active Pharmacy Records, Multiple Informants, Other  ondansetron (ZOFRAN-ODT) 4 MG disintegrating tablet [id number]  Take 1 tablet (4 mg total) by mouth every 8 (eight) hours as needed. [name], DO  Active   potassium chloride SA (KLOR-CON M) 10 MEQ tablet [id number] No Take 1 tablet (10 mEq total) by mouth daily. [name], DO 12/14/2022 Active   SUMAtriptan (IMITREX) 20 MG/ACT nasal spray [id number] No Place 20 mg into the nose as needed. [provider] 12/14/2022 Active Self, Pharmacy Records, Multiple Informants, Other  topiramate (TOPAMAX) 25 MG tablet [id number] No Take 1 tablet (25 mg total) by mouth 2 (two) times daily. [name], DO 12/14/2022 Active   traZODone (DESYREL) 50 MG tablet [id number] No Take 1 tablet (50 mg total) by mouth at bedtime as needed for sleep. [name], DO 12/14/2022 Active            Patient Active Problem List   Diagnosis Date Noted   Major depressive disorder, recurrent episode,  severe, without psychosis (HCC) 11/11/2022   Intentional overdose (HCC) 11/08/2022   Amenorrhea 09/05/2022   Possible pregnancy 09/05/2022   Symptomatic cholelithiasis 02/16/2022   Peritoneal adhesions without obstruction 02/16/2022   Hematemesis 01/14/2022   Encounter for supervision of other normal pregnancy, first trimester 11/12/2021   Morbid obesity (HCC) 09/30/2021   Acquired hypothyroidism 06/25/2021   Migraine without aura and without status migrainosus, not intractable 03/05/2021   Mild intermittent asthma without complication 12/01/2020   Gastroesophageal reflux disease 12/01/2020   Hypoglycemia 12/01/2020   Rheumatoid factor positive 12/09/2019   SS-A antibody positive 10/28/2019   Chronic pain of both knees 10/28/2019   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   Schizoaffective disorder, depressive type (HCC) 02/02/2018   Suicide attempt (HCC) 01/16/2018   Dysfunctional uterine bleeding 01/06/2018   Borderline personality disorder (HCC) 01/04/2018   PTSD (post-traumatic stress disorder) 01/03/2018   ASCUS of cervix with negative high risk HPV 08/28/2017   Malingering 05/06/2016   Conditions to be addressed/monitored per PCP order:  Chronic healthcare management needs, asthma, osteoarthritis, migraines, GERD, PTSD, HTN, preDM, HLD, BP, schizoaffective disorder,   Care Plan : RN Care Manager Plan of Care  Updates made by Danie Chandler, RN since 12/27/2022 12:00 AM     Problem: Health Promotion or Disease Self-Management (General Plan of Care)      Long-Range Goal:  Chronic Disease Management   Start Date: 08/22/2022  Expected End Date: 03/29/2023  Priority: High  Note:   Current Barriers:  Knowledge Deficits related to plan of care for management of migraines, asthma, GERD, PTSD, borderline personality disorder, schizoaffective disorder, chronic pain, hypothyroid, osteoarthritis  Chronic Disease Management support and education needs related to  Knowledge Deficits related  to plan of care for management of migraines, asthma, GERD, PTSD, borderline personality disorder, schizoaffective disorder, chronic pain, hypothyroid, osteoarthritis 12/27/22:  PT completed and knee improved.  5 ED visits since last encounter with 1 hospital admit 6/24-7/1 for suicide attempt.  Blood pressure and blood sugar stable.  COVID 7/31-better now, breathing WNL.  Headaches now improved following medication adjustment-has NEURO f/u scheduled.      RNCM Clinical Goal(s):  Patient will verbalize understanding of plan for management of migraines, asthma, GERD, PTSD, borderline personality disorder, schizoaffective disorder, chronic pain, hypothyroid, osteoarthritis  verbalize basic understanding of  migraines, asthma, GERD, PTSD, borderline personality disorder, schizoaffective disorder, chronic pain, hypothyroid, osteoarthritis  take all medications exactly as prescribed and will call provider for medication related questions as evidenced by patient report demonstrate understanding of rationale for each prescribed medication as evidenced by patient report attend all scheduled medical appointments as evidenced by patient report and EMR review demonstrate ongoing  adherence to prescribed treatment plan for migraines, asthma, GERD, PTSD, borderline personality disorder, schizoaffective disorder, chronic pain, hypothyroid, osteoarthritis  continue to work with RN Care Manager to address care management and care coordination needs related to migraines, asthma, GERD, PTSD, borderline personality disorder, schizoaffective disorder, chronic pain, hypothyroid, osteoarthritis  experience decrease in ED visits as evidenced by EMR review.  ED visits in in last 6 months = 16 through collaboration with RN Care manager, provider, and care team.   Interventions: Inter-disciplinary care team collaboration (see longitudinal plan of care) Evaluation of current treatment plan related to  self management and  patient's adherence to plan as established by provider  Asthma: (Status:New goal.) Long Term Goal Advised patient to track and manage Asthma triggers Advised patient to self assesses Asthma action plan zone and make appointment with provider if in the yellow zone for 48 hours without improvement Provided education about and advised patient to utilize infection prevention strategies to reduce risk of respiratory infection Discussed the importance of adequate rest and management of fatigue with Asthma Assessed social determinant of health barriers   Pain Interventions:  (Status:  New goal.) Long Term Goal Pain assessment performed Medications reviewed Reviewed provider established plan for pain management Discussed importance of adherence to all scheduled medical appointments Counseled on the importance of reporting any/all new or changed pain symptoms or management strategies to pain management provider Advised patient to report to care team affect of pain on daily activities Discussed use of relaxation techniques and/or diversional activities to assist with pain reduction (distraction, imagery, relaxation, massage, acupressure, TENS, heat, and cold application Reviewed with patient prescribed pharmacological and nonpharmacological pain relief strategies Assessed social determinant of health barriers  Patient Goals/Self-Care Activities: Take all medications as prescribed Attend all scheduled provider appointments Call pharmacy for medication refills 3-7 days in advance of running out of medications Perform all self care activities independently  Perform IADL's (shopping, preparing meals, housekeeping, managing finances) independently Call provider office for new concerns or questions   Follow Up Plan:  The patient has been provided with contact information for the care management team and has been advised to call with any health related questions or concerns.  The care management team  will reach out to the patient again over the next 60 business  days.    Long-Range Goal: Establish Plan of Care for Chronic Disease Management Needs   Priority: High  Note:   Timeframe:  Long-Range Goal Priority:  High Start Date:  08/22/22                           Expected End Date: ongoing                      Follow Up Date 02/13/23   - practice safe sex - schedule appointment for vaccines needed due to my age or health - schedule recommended health tests (blood work, mammogram, colonoscopy, pap test) - schedule and keep appointment for annual check-up    Why is this important?   Screening tests can find diseases early when they are easier to treat.  Your doctor or nurse will talk with you about which tests are important for you.  Getting shots for common diseases like the flu and shingles will help prevent them.  12/27/22:  Up to date on all appts-has upcoming dental and CARDS appt   Follow Up:  Patient agrees to Care Plan and Follow-up.  Plan: The Managed Medicaid care management team will reach out to the patient again over the next 45 business  days. and The  Patient has been provided with contact information for the Managed Medicaid care management team and has been advised to call with any health related questions or concerns.  Date/time of next scheduled RN care management/care coordination outreach:  02/13/23 at 1115

## 2022-12-27 NOTE — Patient Instructions (Signed)
Hi Ms. Khachatryan, thank you for updating me today-have a wonderful day!  Ms. Mesich was given information about Medicaid Managed Care team care coordination services and verbally consented to engagement with the [facility] Managed Care team.   Ms. Fournet - following are the goals we discussed in your visit today:   Goals Addressed    Timeframe:  Long-Range Goal Priority:  High Start Date:  08/22/22                           Expected End Date: ongoing                      Follow Up Date 02/13/23   - practice safe sex - schedule appointment for vaccines needed due to my age or health - schedule recommended health tests (blood work, mammogram, colonoscopy, pap test) - schedule and keep appointment for annual check-up    Why is this important?   Screening tests can find diseases early when they are easier to treat.  Your doctor or nurse will talk with you about which tests are important for you.  Getting shots for common diseases like the flu and shingles will help prevent them.  12/27/22:  Up to date on all appts-has upcoming dental and CARDS appt  Patient verbalizes understanding of instructions and care plan provided today and agrees to view in MyChart. Active MyChart status and patient understanding of how to access instructions and care plan via MyChart confirmed with patient.     The Managed Medicaid care management team will reach out to the patient again over the next 45 business  days.  The  Patient  has been provided with contact information for the Managed Medicaid care management team and has been advised to call with any health related questions or concerns.   Following is a copy of your plan of care:  Care Plan : RN Care Manager Plan of Care  Updates made by Danie Chandler, RN since 12/27/2022 12:00 AM     Problem: Health Promotion or Disease Self-Management (General Plan of Care)      Long-Range Goal: Chronic Disease Management   Start Date: 08/22/2022  Expected End Date:  03/29/2023  Priority: High  Note:   Current Barriers:  Knowledge Deficits related to plan of care for management of migraines, asthma, GERD, PTSD, borderline personality disorder, schizoaffective disorder, chronic pain, hypothyroid, osteoarthritis  Chronic Disease Management support and education needs related to  Knowledge Deficits related to plan of care for management of migraines, asthma, GERD, PTSD, borderline personality disorder, schizoaffective disorder, chronic pain, hypothyroid, osteoarthritis 12/27/22:  PT completed and knee improved.  5 ED visits since last encounter with 1 hospital admit 6/24-7/1 for suicide attempt.  Blood pressure and blood sugar stable.  COVID 7/31-better now, breathing WNL.  Headaches now improved following medication adjustment-has NEURO f/u scheduled.      RNCM Clinical Goal(s):  Patient will verbalize understanding of plan for management of migraines, asthma, GERD, PTSD, borderline personality disorder, schizoaffective disorder, chronic pain, hypothyroid, osteoarthritis  verbalize basic understanding of  migraines, asthma, GERD, PTSD, borderline personality disorder, schizoaffective disorder, chronic pain, hypothyroid, osteoarthritis  take all medications exactly as prescribed and will call provider for medication related questions as evidenced by patient report demonstrate understanding of rationale for each prescribed medication as evidenced by patient report attend all scheduled medical appointments as evidenced by patient report and EMR review demonstrate ongoing  adherence to  prescribed treatment plan for migraines, asthma, GERD, PTSD, borderline personality disorder, schizoaffective disorder, chronic pain, hypothyroid, osteoarthritis  continue to work with RN Care Manager to address care management and care coordination needs related to migraines, asthma, GERD, PTSD, borderline personality disorder, schizoaffective disorder, chronic pain, hypothyroid,  osteoarthritis  experience decrease in ED visits as evidenced by EMR review.  ED visits in in last 6 months = 16 through collaboration with RN Care manager, provider, and care team.   Interventions: Inter-disciplinary care team collaboration (see longitudinal plan of care) Evaluation of current treatment plan related to  self management and patient's adherence to plan as established by provider  Asthma: (Status:New goal.) Long Term Goal Advised patient to track and manage Asthma triggers Advised patient to self assesses Asthma action plan zone and make appointment with provider if in the yellow zone for 48 hours without improvement Provided education about and advised patient to utilize infection prevention strategies to reduce risk of respiratory infection Discussed the importance of adequate rest and management of fatigue with Asthma Assessed social determinant of health barriers   Pain Interventions:  (Status:  New goal.) Long Term Goal Pain assessment performed Medications reviewed Reviewed provider established plan for pain management Discussed importance of adherence to all scheduled medical appointments Counseled on the importance of reporting any/all new or changed pain symptoms or management strategies to pain management provider Advised patient to report to care team affect of pain on daily activities Discussed use of relaxation techniques and/or diversional activities to assist with pain reduction (distraction, imagery, relaxation, massage, acupressure, TENS, heat, and cold application Reviewed with patient prescribed pharmacological and nonpharmacological pain relief strategies Assessed social determinant of health barriers  Patient Goals/Self-Care Activities: Take all medications as prescribed Attend all scheduled provider appointments Call pharmacy for medication refills 3-7 days in advance of running out of medications Perform all self care activities independently   Perform IADL's (shopping, preparing meals, housekeeping, managing finances) independently Call provider office for new concerns or questions   Follow Up Plan:  The patient has been provided with contact information for the care management team and has been advised to call with any health related questions or concerns.  The care management team will reach out to the patient again over the next 60 business  days.    Kathi Der RN, BSN [place]  [employer] - Managed Medicaid High Risk [phone]

## 2022-12-29 ENCOUNTER — Ambulatory Visit: Payer: Medicaid Other

## 2023-01-03 ENCOUNTER — Ambulatory Visit: Payer: Medicaid Other

## 2023-01-05 ENCOUNTER — Ambulatory Visit: Payer: Medicaid Other

## 2023-01-10 ENCOUNTER — Ambulatory Visit: Payer: Medicaid Other | Admitting: Physical Therapy

## 2023-01-11 ENCOUNTER — Ambulatory Visit (INDEPENDENT_AMBULATORY_CARE_PROVIDER_SITE_OTHER): Payer: Medicaid Other

## 2023-01-11 VITALS — BP 138/82 | Ht 67.0 in | Wt 274.2 lb

## 2023-01-11 DIAGNOSIS — Z0000 Encounter for general adult medical examination without abnormal findings: Secondary | ICD-10-CM | POA: Diagnosis not present

## 2023-01-11 DIAGNOSIS — Z23 Encounter for immunization: Secondary | ICD-10-CM | POA: Diagnosis not present

## 2023-01-11 NOTE — Progress Notes (Signed)
Subjective:   Melanie Bell is a 32 y.o. female who presents for Medicare Annual (Subsequent) preventive examination.  Visit Complete: In person   Review of Systems     Cardiac Risk Factors include: advanced age (>85men, >13 women);obesity (BMI >30kg/m2)     Objective:    Today's Vitals   01/11/23 1308  BP: 138/82  Weight: 274 lb 3.2 oz (124.4 kg)  Height: 5\' 7"  (1.702 m)   Body mass index is 42.95 kg/m.     01/11/2023    1:16 PM 12/08/2022    1:22 PM 11/28/2022    4:07 PM 11/14/2022   12:45 PM 11/08/2022    6:48 PM 11/07/2022    5:00 PM 10/24/2022    9:09 AM  Advanced Directives  Does Patient Have a Medical Advance Directive? No No No   No No  Would patient like information on creating a medical advance directive? No - Patient declined  No - Patient declined   No - Patient declined No - Patient declined     Information is confidential and restricted. Go to Review Flowsheets to unlock data.    Current Medications (verified) Outpatient Encounter Medications as of 01/11/2023  Medication Sig   albuterol (VENTOLIN HFA) 108 (90 Base) MCG/ACT inhaler 2 puffs four times a day as needed   ARIPiprazole (ABILIFY) 5 MG tablet Take 1 tablet (5 mg total) by mouth daily.   cabergoline (DOSTINEX) 0.5 MG tablet Take 1 tablet (0.5 mg total) by mouth once a week.   escitalopram (LEXAPRO) 10 MG tablet Take 1 tablet (10 mg total) by mouth daily.   furosemide (LASIX) 20 MG tablet Take 1 tablet (20 mg total) by mouth daily.   levothyroxine (SYNTHROID) 75 MCG tablet Take 1 tablet (75 mcg total) by mouth daily before breakfast.   lithium carbonate 300 MG capsule Take 1 capsule (300 mg total) by mouth 2 (two) times daily with a meal.   omeprazole (PRILOSEC) 20 MG capsule TAKE 1 CAPSULE (20 MG TOTAL) BY MOUTH DAILY.   ondansetron (ZOFRAN-ODT) 4 MG disintegrating tablet Take 1 tablet (4 mg total) by mouth every 8 (eight) hours as needed.   potassium chloride SA (KLOR-CON M) 10 MEQ tablet Take  1 tablet (10 mEq total) by mouth daily.   SUMAtriptan (IMITREX) 20 MG/ACT nasal spray Place 20 mg into the nose as needed.   topiramate (TOPAMAX) 25 MG tablet Take 1 tablet (25 mg total) by mouth 2 (two) times daily.   traZODone (DESYREL) 50 MG tablet Take 1 tablet (50 mg total) by mouth at bedtime as needed for sleep.   cetirizine (ZYRTEC) 10 MG tablet Take 4 tablets (40 mg total) by mouth daily for 5 days. (Patient not taking: Reported on 09/28/2022)   hydrOXYzine (VISTARIL) 50 MG capsule Take 50 mg by mouth 2 (two) times daily as needed. (Patient not taking: Reported on 01/11/2023)   meloxicam (MOBIC) 15 MG tablet Take 1 tablet (15 mg total) by mouth daily. (Patient not taking: Reported on 01/11/2023)   No facility-administered encounter medications on file as of 01/11/2023.    Allergies (verified) Betadine [povidone iodine], Fish allergy, and Shellfish-derived products   History: Past Medical History:  Diagnosis Date   Allergy    Anxiety    Arthritis    Asthma    Depression    Foreign body aspiration    GERD (gastroesophageal reflux disease)    Hallucinations 09/30/2014   Sizoaffective   Hyperlipidemia    Hypertension  Hypoglycemia    Intentional ingestion of batteries 04/21/2016    2 AAA batteries and 1 thumb tack; intent to hurt herself/notes 04/21/2016   Self-inflicted injury 11/23/2017   Suicidal ideation 01/02/2018   Tardive dyskinesia 10/2014   recent onset   Thyroid disease    Past Surgical History:  Procedure Laterality Date   ABDOMINAL SURGERY     "years ago" to remove foreign objects   APPENDECTOMY     BREAST EXCISIONAL BIOPSY Right 09/03/2007   surgical bx removed fibroadeoma   BREAST LUMPECTOMY Right    CHOLECYSTECTOMY  02/2022   COLONOSCOPY WITH PROPOFOL N/A 09/10/2015   Procedure: COLONOSCOPY WITH PROPOFOL;  Surgeon: Christena Deem, MD;  Location: [facility] ENDOSCOPY;  Service: Endoscopy;  Laterality: N/A;   ESOPHAGOGASTRODUODENOSCOPY N/A 11/28/2014    Procedure: ESOPHAGOGASTRODUODENOSCOPY (EGD);  Surgeon: Scot Jun, MD;  Location: [facility] ENDOSCOPY;  Service: Endoscopy;  Laterality: N/A;   ESOPHAGOGASTRODUODENOSCOPY N/A 02/21/2016   Procedure: ESOPHAGOGASTRODUODENOSCOPY (EGD);  Surgeon: Napoleon Form, MD;  Location: [facility] ENDOSCOPY;  Service: Endoscopy;  Laterality: N/A;   ESOPHAGOGASTRODUODENOSCOPY N/A 04/21/2016   Procedure: ESOPHAGOGASTRODUODENOSCOPY (EGD);  Surgeon: Iva Boop, MD;  Location: [facility] ENDOSCOPY;  Service: Endoscopy;  Laterality: N/A;   ESOPHAGOGASTRODUODENOSCOPY N/A 05/06/2016   Procedure: ESOPHAGOGASTRODUODENOSCOPY (EGD);  Surgeon: Wyline Mood, MD;  Location: [facility] ENDOSCOPY;  Service: Gastroenterology;  Laterality: N/A;   ESOPHAGOGASTRODUODENOSCOPY N/A 06/13/2016   Procedure: ESOPHAGOGASTRODUODENOSCOPY (EGD);  Surgeon: Midge Minium, MD;  Location: [facility] ENDOSCOPY;  Service: Endoscopy;  Laterality: N/A;   ESOPHAGOGASTRODUODENOSCOPY N/A 01/14/2022   Procedure: ESOPHAGOGASTRODUODENOSCOPY (EGD);  Surgeon: Toledo, Boykin Nearing, MD;  Location: [facility] ENDOSCOPY;  Service: Gastroenterology;  Laterality: N/A;   ESOPHAGOGASTRODUODENOSCOPY (EGD) WITH PROPOFOL N/A 02/29/2016   Procedure: ESOPHAGOGASTRODUODENOSCOPY (EGD) WITH PROPOFOL;  Surgeon: Midge Minium, MD;  Location: [facility] ENDOSCOPY;  Service: Endoscopy;  Laterality: N/A;   ESOPHAGOGASTRODUODENOSCOPY (EGD) WITH PROPOFOL N/A 01/15/2018   Procedure: ESOPHAGOGASTRODUODENOSCOPY (EGD) WITH PROPOFOL;  Surgeon: Corbin Ade, MD;  Location: [facility] ENDO SUITE;  Service: Endoscopy;  Laterality: N/A;  retrieval forein body   LAPAROSCOPIC LYSIS OF ADHESIONS  02/16/2022   Procedure: LAPAROSCOPIC LYSIS OF ADHESIONS;  Surgeon: Henrene Dodge, MD;  Location: [facility] ORS;  Service: General;;   LAPAROTOMY N/A 09/12/2015   Procedure: EXPLORATORY LAPAROTOMY;  Surgeon: Lattie Haw, MD;  Location: [facility] ORS;  Service: General;  Laterality: N/A;   SIGMOIDOSCOPY N/A 09/12/2015   Procedure: Daiva Huge;   Surgeon: Lattie Haw, MD;  Location: [facility] ORS;  Service: General;  Laterality: N/A;   WISDOM TOOTH EXTRACTION     Family History  Problem Relation Age of Onset   Depression Mother    Hypertension Mother    Sleep apnea Mother    Asthma Mother    COPD Mother    Diabetes Mother    Anxiety disorder Mother    Arthritis Mother    Heart failure Mother    Breast cancer Maternal Aunt        20's   Stroke Maternal Grandmother    Heart attack Maternal Grandfather    Social History   Socioeconomic History   Marital status: Married    Spouse name: Not on file   Number of children: 0   Years of education: 12   Highest education level: Not on file  Occupational History   Occupation: unemployed  Tobacco Use   Smoking status: Former    Current packs/day: 0.00    Average packs/day: 0.5 packs/day for 3.0 years (1.5 ttl pk-yrs)    Types:  Cigarettes    Start date: 08/14/2013    Quit date: 08/14/2016    Years since quitting: 6.4    Passive exposure: Past   Smokeless tobacco: Never  Vaping Use   Vaping status: Never Used  Substance and Sexual Activity   Alcohol use: No   Drug use: No   Sexual activity: Yes    Partners: Male    Birth control/protection: None  Other Topics Concern   Not on file  Social History Narrative   Right handed    One story home   Drinks occasional caffeine   Social Determinants of Health   Financial Resource Strain: Low Risk  (01/11/2023)   Overall Financial Resource Strain (CARDIA)    Difficulty of Paying Living Expenses: Not very hard  Food Insecurity: No Food Insecurity (01/11/2023)   Hunger Vital Sign    Worried About Running Out of Food in the Last Year: Never true    Ran Out of Food in the Last Year: Never true  Transportation Needs: No Transportation Needs (01/11/2023)   PRAPARE - Administrator, Civil Service (Medical): No    Lack of Transportation (Non-Medical): No  Physical Activity: Sufficiently Active (01/11/2023)   Exercise  Vital Sign    Days of Exercise per Week: 5 days    Minutes of Exercise per Session: 30 min  Recent Concern: Physical Activity - Insufficiently Active (12/27/2022)   Exercise Vital Sign    Days of Exercise per Week: 7 days    Minutes of Exercise per Session: 20 min  Stress: No Stress Concern Present (01/11/2023)   [employer] of Occupational Health - Occupational Stress Questionnaire    Feeling of Stress : Not at all  Social Connections: Moderately Integrated (01/11/2023)   Social Connection and Isolation Panel [NHANES]    Frequency of Communication with Friends and Family: More than three times a week    Frequency of Social Gatherings with Friends and Family: Once a week    Attends Religious Services: More than 4 times per year    Active Member of [employer] or Organizations: No    Attends Engineer, structural: Never    Marital Status: Married    Tobacco Counseling Counseling given: Not Answered   Clinical Intake:  Pre-visit preparation completed: Yes  Pain : No/denies pain     Nutritional Status: BMI > 30  Obese Nutritional Risks: None Diabetes: No  How often do you need to have someone help you when you read instructions, pamphlets, or other written materials from your doctor or pharmacy?: 1 - Never  Interpreter Needed?: No  Information entered by :: Kennedy Bucker, LPN   Activities of Daily Living    01/11/2023    1:18 PM 01/07/2023   11:29 AM  In your present state of health, do you have any difficulty performing the following activities:  Hearing? 0 0  Vision? 0 0  Difficulty concentrating or making decisions? 0 0  Walking or climbing stairs? 1 0  Comment arthritis, knees   Dressing or bathing? 0 0  Doing errands, shopping? 0 0  Preparing Food and eating ? N N  Using the Toilet? N N  In the past six months, have you accidently leaked urine? N N  Do you have problems with loss of bowel control? N N  Managing your Medications? N N  Managing your  Finances? N N  Housekeeping or managing your Housekeeping? N N    Patient Care Team: [name], MD  as PCP - General (Internal Medicine) Debbe Odea, MD as PCP - Cardiology (Cardiology) Oletta Darter, MD (Psychiatry) Patterson Hammersmith, MD as Consulting Physician (Rheumatology) Shamleffer, Konrad Dolores, MD as Consulting Physician (Endocrinology) Doreene Burke, CNM as Midwife (Certified Nurse Midwife) Lonell Face, MD as Consulting Physician (Neurology) Craft, Calvert Cantor, RN as Case Manager  Indicate any recent Medical Services you may have received from other than [facility] providers in the past year (date may be approximate).     Assessment:   This is a routine wellness examination for [place].  Hearing/Vision screen Hearing Screening - Comments:: No aids Vision Screening - Comments:: Wears glasses- [facility]  Dietary issues and exercise activities discussed:     Goals Addressed             This Visit's Progress    DIET - EAT MORE FRUITS AND VEGETABLES         Depression Screen    01/11/2023    1:13 PM 06/20/2022    1:51 PM 01/12/2022    3:00 PM 01/05/2022    3:42 PM 09/03/2021    9:15 AM 03/05/2021    9:04 AM 01/04/2021    3:43 PM  PHQ 2/9 Scores  PHQ - 2 Score 0 0 0 0 0 0 0  PHQ- 9 Score 0 2 2 0 0 0 0    Fall Risk    01/11/2023    1:17 PM 01/07/2023   11:29 AM 06/20/2022    1:51 PM 01/12/2022    3:01 PM 01/05/2022    3:47 PM  Fall Risk   Falls in the past year? 1 0 0 0 1  Number falls in past yr: 0 0 0 0 0  Injury with Fall? 1 0 0 0 0  Risk for fall due to : History of fall(s)  No Fall Risks No Fall Risks;History of fall(s) History of fall(s)  Follow up Falls prevention discussed;Falls evaluation completed  Falls evaluation completed Falls evaluation completed Falls evaluation completed    MEDICARE RISK AT HOME: Medicare Risk at Home Any stairs in or around the home?: Yes If so, are there any without handrails?: No Home free of loose throw rugs in  walkways, pet beds, electrical cords, etc?: Yes Adequate lighting in your home to reduce risk of falls?: Yes Life alert?: No Use of a cane, walker or w/c?: Yes (cane, if knees hurt) Grab bars in the bathroom?: No Shower chair or bench in shower?: Yes Elevated toilet seat or a handicapped toilet?: No  TIMED UP AND GO:  Was the test performed?  Yes  Length of time to ambulate 10 feet: 4 sec Gait steady and fast without use of assistive device    Cognitive Function:        01/11/2023    1:20 PM 01/05/2022    3:48 PM  6CIT Screen  What Year? 0 points 0 points  What month? 0 points 0 points  What time? 0 points 0 points  Count back from 20 0 points 0 points  Months in reverse 0 points 0 points  Repeat phrase 0 points 0 points  Total Score 0 points 0 points    Immunizations Immunization History  Administered Date(s) Administered   Influenza,inj,Quad PF,6+ Mos 03/05/2021, 01/12/2022   Influenza-Unspecified 05/31/2016   Moderna Sars-Covid-2 Vaccination 12/29/2019, 01/26/2020, 10/04/2020   PPD Test 12/05/2017   Tdap 08/21/2019    TDAP status: Up to date  Flu Vaccine status: Completed at today's visit  Pneumococcal vaccine status:  Declined,  Education has been provided regarding the importance of this vaccine but patient still declined. Advised may receive this vaccine at local pharmacy or Health Dept. Aware to provide a copy of the vaccination record if obtained from local pharmacy or Health Dept. Verbalized acceptance and understanding.   Covid-19 vaccine status: Completed vaccines  Qualifies for Shingles Vaccine? No   Zostavax completed No   Shingrix Completed?: No.    Education has been provided regarding the importance of this vaccine. Patient has been advised to call insurance company to determine out of pocket expense if they have not yet received this vaccine. Advised may also receive vaccine at local pharmacy or Health Dept. Verbalized acceptance and  understanding.  Screening Tests Health Maintenance  Topic Date Due   COVID-19 Vaccine (4 - 2023-24 season) 01/14/2022   PAP SMEAR-Modifier  08/21/2022   INFLUENZA VACCINE  12/15/2022   DTaP/Tdap/Td (2 - Td or Tdap) 08/20/2029   Hepatitis C Screening  Completed   HIV Screening  Completed   Pneumococcal Vaccine 64-29 Years old  Aged Out   HPV VACCINES  Aged Out    Health Maintenance  Health Maintenance Due  Topic Date Due   COVID-19 Vaccine (4 - 2023-24 season) 01/14/2022   PAP SMEAR-Modifier  08/21/2022   INFLUENZA VACCINE  12/15/2022     Lung Cancer Screening: (Low Dose CT Chest recommended if Age 68-80 years, 20 pack-year currently smoking OR have quit w/in 15years.) does not qualify.    Additional Screening:  Hepatitis C Screening: does qualify; Completed 08/24/20  Vision Screening: Recommended annual ophthalmology exams for early detection of glaucoma and other disorders of the eye. Is the patient up to date with their annual eye exam?  Yes  Who is the provider or what is the name of the office in which the patient attends annual eye exams? [facility] If pt is not established with a provider, would they like to be referred to a provider to establish care? No .   Dental Screening: Recommended annual dental exams for proper oral hygiene   Community Resource Referral / Chronic Care Management: CRR required this visit?  No   CCM required this visit?  No     Plan:     I have personally reviewed and noted the following in the patient's chart:   Medical and social history Use of alcohol, tobacco or illicit drugs  Current medications and supplements including opioid prescriptions. Patient is not currently taking opioid prescriptions. Functional ability and status Nutritional status Physical activity Advanced directives List of other physicians Hospitalizations, surgeries, and ER visits in previous 12 months Vitals Screenings to include cognitive, depression, and  falls Referrals and appointments  In addition, I have reviewed and discussed with patient certain preventive protocols, quality metrics, and best practice recommendations. A written personalized care plan for preventive services as well as general preventive health recommendations were provided to patient.     Hal Hope, LPN   1/61/0960   After Visit Summary: (MyChart) Due to this being a telephonic visit, the after visit summary with patients personalized plan was offered to patient via MyChart   Nurse Notes: none

## 2023-01-11 NOTE — Patient Instructions (Addendum)
Melanie Bell , Thank you for taking time to come for your Medicare Wellness Visit. I appreciate your ongoing commitment to your health goals. Please review the following plan we discussed and let me know if I can assist you in the future.   Referrals/Orders/Follow-Ups/Clinician Recommendations: none  This is a list of the screening recommended for you and due dates:  Health Maintenance  Topic Date Due   COVID-19 Vaccine (4 - 2023-24 season) 01/14/2022   Pap Smear  08/21/2022   Flu Shot  12/15/2022   DTaP/Tdap/Td vaccine (2 - Td or Tdap) 08/20/2029   Hepatitis C Screening  Completed   HIV Screening  Completed   Pneumococcal Vaccination  Aged Out   HPV Vaccine  Aged Out    Advanced directives: (ACP Link)Information on Advanced Care Planning can be found at [facility] Advance Health Care Directives Advance Health Care Directives ([email or web address]   Next Medicare Annual Wellness Visit scheduled for next year: Yes    01/17/24 @ 11:15 am in person

## 2023-01-12 ENCOUNTER — Ambulatory Visit: Payer: Medicaid Other

## 2023-01-16 ENCOUNTER — Encounter (HOSPITAL_COMMUNITY): Payer: Self-pay | Admitting: Neurology

## 2023-01-16 ENCOUNTER — Inpatient Hospital Stay (HOSPITAL_COMMUNITY): Payer: No Typology Code available for payment source

## 2023-01-16 ENCOUNTER — Other Ambulatory Visit: Payer: Self-pay

## 2023-01-16 ENCOUNTER — Inpatient Hospital Stay (HOSPITAL_COMMUNITY)
Admission: RE | Admit: 2023-01-16 | Discharge: 2023-01-19 | DRG: 880 | Disposition: A | Discharge status: Home / Self Care | Payer: No Typology Code available for payment source | Source: Ambulatory Visit | Attending: Neurology | Admitting: Neurology

## 2023-01-16 DIAGNOSIS — Z79899 Other long term (current) drug therapy: Secondary | ICD-10-CM | POA: Diagnosis not present

## 2023-01-16 DIAGNOSIS — R569 Unspecified convulsions: Secondary | ICD-10-CM

## 2023-01-16 DIAGNOSIS — Z825 Family history of asthma and other chronic lower respiratory diseases: Secondary | ICD-10-CM | POA: Diagnosis not present

## 2023-01-16 DIAGNOSIS — Z9151 Personal history of suicidal behavior: Secondary | ICD-10-CM

## 2023-01-16 DIAGNOSIS — F419 Anxiety disorder, unspecified: Secondary | ICD-10-CM | POA: Diagnosis present

## 2023-01-16 DIAGNOSIS — K219 Gastro-esophageal reflux disease without esophagitis: Secondary | ICD-10-CM | POA: Diagnosis present

## 2023-01-16 DIAGNOSIS — I10 Essential (primary) hypertension: Secondary | ICD-10-CM | POA: Diagnosis present

## 2023-01-16 DIAGNOSIS — G35 Multiple sclerosis: Secondary | ICD-10-CM | POA: Diagnosis present

## 2023-01-16 DIAGNOSIS — Z823 Family history of stroke: Secondary | ICD-10-CM | POA: Diagnosis not present

## 2023-01-16 DIAGNOSIS — Z803 Family history of malignant neoplasm of breast: Secondary | ICD-10-CM

## 2023-01-16 DIAGNOSIS — E785 Hyperlipidemia, unspecified: Secondary | ICD-10-CM | POA: Diagnosis present

## 2023-01-16 DIAGNOSIS — Z79890 Hormone replacement therapy (postmenopausal): Secondary | ICD-10-CM

## 2023-01-16 DIAGNOSIS — Z818 Family history of other mental and behavioral disorders: Secondary | ICD-10-CM

## 2023-01-16 DIAGNOSIS — Z87891 Personal history of nicotine dependence: Secondary | ICD-10-CM

## 2023-01-16 DIAGNOSIS — Z883 Allergy status to other anti-infective agents status: Secondary | ICD-10-CM

## 2023-01-16 DIAGNOSIS — Z833 Family history of diabetes mellitus: Secondary | ICD-10-CM | POA: Diagnosis not present

## 2023-01-16 DIAGNOSIS — G43909 Migraine, unspecified, not intractable, without status migrainosus: Secondary | ICD-10-CM | POA: Diagnosis present

## 2023-01-16 DIAGNOSIS — Z836 Family history of other diseases of the respiratory system: Secondary | ICD-10-CM

## 2023-01-16 DIAGNOSIS — M1990 Unspecified osteoarthritis, unspecified site: Secondary | ICD-10-CM | POA: Diagnosis present

## 2023-01-16 DIAGNOSIS — J45909 Unspecified asthma, uncomplicated: Secondary | ICD-10-CM | POA: Diagnosis present

## 2023-01-16 DIAGNOSIS — E039 Hypothyroidism, unspecified: Secondary | ICD-10-CM | POA: Diagnosis present

## 2023-01-16 DIAGNOSIS — Z91013 Allergy to seafood: Secondary | ICD-10-CM | POA: Diagnosis not present

## 2023-01-16 DIAGNOSIS — F445 Conversion disorder with seizures or convulsions: Principal | ICD-10-CM | POA: Diagnosis present

## 2023-01-16 DIAGNOSIS — Z8261 Family history of arthritis: Secondary | ICD-10-CM

## 2023-01-16 DIAGNOSIS — F32A Depression, unspecified: Secondary | ICD-10-CM | POA: Diagnosis present

## 2023-01-16 DIAGNOSIS — Z8249 Family history of ischemic heart disease and other diseases of the circulatory system: Secondary | ICD-10-CM | POA: Diagnosis not present

## 2023-01-16 HISTORY — DX: Unspecified convulsions: R56.9

## 2023-01-16 LAB — COMPREHENSIVE METABOLIC PANEL
ALT: 13 U/L (ref 0–44)
AST: 16 U/L (ref 15–41)
Albumin: 3.6 g/dL (ref 3.5–5.0)
Alkaline Phosphatase: 60 U/L (ref 38–126)
Anion gap: 16 — ABNORMAL HIGH (ref 5–15)
BUN: 8 mg/dL (ref 6–20)
CO2: 23 mmol/L (ref 22–32)
Calcium: 9.5 mg/dL (ref 8.9–10.3)
Chloride: 102 mmol/L (ref 98–111)
Creatinine, Ser: 0.71 mg/dL (ref 0.44–1.00)
GFR, Estimated: >60 mL/min (ref >60)
Glucose, Bld: 95 mg/dL (ref 70–99)
Potassium: 4.1 mmol/L (ref 3.5–5.1)
Sodium: 141 mmol/L (ref 135–145)
Total Bilirubin: 0.5 mg/dL (ref 0.3–1.2)
Total Protein: 6.5 g/dL (ref 6.5–8.1)

## 2023-01-16 LAB — PROTIME-INR
INR: 1 (ref 0.8–1.2)
Prothrombin Time: 13.1 s (ref 11.4–15.2)

## 2023-01-16 LAB — CBC WITH DIFFERENTIAL/PLATELET
Abs Immature Granulocytes: 0.03 10*3/uL (ref 0.00–0.07)
Basophils Absolute: 0 10*3/uL (ref 0.0–0.1)
Basophils Relative: 0 %
Eosinophils Absolute: 0 10*3/uL (ref 0.0–0.5)
Eosinophils Relative: 0 %
HCT: 40.9 % (ref 36.0–46.0)
Hemoglobin: 13.2 g/dL (ref 12.0–15.0)
Immature Granulocytes: 0 %
Lymphocytes Relative: 22 %
Lymphs Abs: 1.5 10*3/uL (ref 0.7–4.0)
MCH: 29.9 pg (ref 26.0–34.0)
MCHC: 32.3 g/dL (ref 30.0–36.0)
MCV: 92.7 fL (ref 80.0–100.0)
Monocytes Absolute: 0.5 10*3/uL (ref 0.1–1.0)
Monocytes Relative: 7 %
Neutro Abs: 4.9 10*3/uL (ref 1.7–7.7)
Neutrophils Relative %: 71 %
Platelets: 197 10*3/uL (ref 150–400)
RBC: 4.41 MIL/uL (ref 3.87–5.11)
RDW: 12.9 % (ref 11.5–15.5)
WBC: 6.9 10*3/uL (ref 4.0–10.5)
nRBC: 0 % (ref 0.0–0.2)

## 2023-01-16 LAB — RAPID URINE DRUG SCREEN, HOSP PERFORMED
Amphetamines: NOT DETECTED
Barbiturates: NOT DETECTED
Benzodiazepines: NOT DETECTED
Cocaine: NOT DETECTED
Opiates: NOT DETECTED
Tetrahydrocannabinol: NOT DETECTED

## 2023-01-16 LAB — PHOSPHORUS: Phosphorus: 2.2 mg/dL — ABNORMAL LOW (ref 2.5–4.6)

## 2023-01-16 LAB — HIV ANTIBODY (ROUTINE TESTING W REFLEX): HIV Screen 4th Generation wRfx: NONREACTIVE

## 2023-01-16 LAB — MAGNESIUM: Magnesium: 1.8 mg/dL (ref 1.7–2.4)

## 2023-01-16 MED ORDER — ONDANSETRON 4 MG PO TBDP: 4.0000 mg | ORAL_TABLET | ORAL | Status: DC | PRN

## 2023-01-16 MED ORDER — ESCITALOPRAM OXALATE 10 MG PO TABS
10.0000 mg | ORAL_TABLET | Freq: Every day | ORAL | Status: DC
  Administered 2023-01-17 – 2023-01-19 (×3): 10 mg via ORAL
  Filled 2023-01-16 (×3): qty 1

## 2023-01-16 MED ORDER — LITHIUM CARBONATE 300 MG PO CAPS
300.0000 mg | ORAL_CAPSULE | Freq: Two times a day (BID) | ORAL | Status: DC
  Administered 2023-01-16 – 2023-01-19 (×6): 300 mg via ORAL
  Filled 2023-01-16 (×8): qty 1

## 2023-01-16 MED ORDER — ACETAMINOPHEN 650 MG RE SUPP: 650.0000 mg | RECTAL | Status: DC | PRN

## 2023-01-16 MED ORDER — ACETAMINOPHEN 325 MG PO TABS
650.0000 mg | ORAL_TABLET | ORAL | Status: DC | PRN
  Administered 2023-01-17 – 2023-01-18 (×2): 650 mg via ORAL
  Filled 2023-01-16 (×2): qty 2

## 2023-01-16 MED ORDER — MAGNESIUM 500 MG PO CAPS: 500.0000 mg | ORAL_CAPSULE | Freq: Every day | ORAL | Status: DC

## 2023-01-16 MED ORDER — LABETALOL HCL 5 MG/ML IV SOLN: 5.0000 mg | INTRAVENOUS | Status: DC | PRN

## 2023-01-16 MED ORDER — PANTOPRAZOLE SODIUM 40 MG PO TBEC
40.0000 mg | DELAYED_RELEASE_TABLET | Freq: Every day | ORAL | Status: DC
  Administered 2023-01-17 – 2023-01-19 (×3): 40 mg via ORAL
  Filled 2023-01-16 (×3): qty 1

## 2023-01-16 MED ORDER — POTASSIUM CHLORIDE CRYS ER 10 MEQ PO TBCR
10.0000 meq | EXTENDED_RELEASE_TABLET | Freq: Every day | ORAL | Status: DC
  Administered 2023-01-17 – 2023-01-19 (×3): 10 meq via ORAL
  Filled 2023-01-16 (×3): qty 1

## 2023-01-16 MED ORDER — ENOXAPARIN SODIUM 60 MG/0.6ML IJ SOSY
60.0000 mg | PREFILLED_SYRINGE | INTRAMUSCULAR | Status: DC
  Administered 2023-01-16 – 2023-01-19 (×4): 60 mg via SUBCUTANEOUS
  Filled 2023-01-16 (×4): qty 0.6

## 2023-01-16 MED ORDER — FUROSEMIDE 20 MG PO TABS
20.0000 mg | ORAL_TABLET | Freq: Every day | ORAL | Status: DC
  Administered 2023-01-17 – 2023-01-19 (×3): 20 mg via ORAL
  Filled 2023-01-16 (×3): qty 1

## 2023-01-16 MED ORDER — MIDAZOLAM HCL 2 MG/2ML IJ SOLN: 1.0000 mg | INTRAMUSCULAR | Status: DC | PRN

## 2023-01-16 MED ORDER — ALBUTEROL SULFATE (2.5 MG/3ML) 0.083% IN NEBU: 3.0000 mL | INHALATION_SOLUTION | Freq: Three times a day (TID) | RESPIRATORY_TRACT | Status: DC | PRN

## 2023-01-16 MED ORDER — TRAZODONE HCL 50 MG PO TABS: 50.0000 mg | ORAL_TABLET | Freq: Every evening | ORAL | Status: DC | PRN

## 2023-01-16 MED ORDER — SUMATRIPTAN 20 MG/ACT NA SOLN
20.0000 mg | NASAL | Status: DC | PRN
  Administered 2023-01-17: 20 mg via NASAL
  Filled 2023-01-16 (×2): qty 1

## 2023-01-16 MED ORDER — ARIPIPRAZOLE 5 MG PO TABS
5.0000 mg | ORAL_TABLET | Freq: Every day | ORAL | Status: DC
  Administered 2023-01-17 – 2023-01-19 (×3): 5 mg via ORAL
  Filled 2023-01-16 (×3): qty 1

## 2023-01-16 MED ORDER — LEVOTHYROXINE SODIUM 75 MCG PO TABS
75.0000 ug | ORAL_TABLET | Freq: Every day | ORAL | Status: DC
  Administered 2023-01-17 – 2023-01-19 (×3): 75 ug via ORAL
  Filled 2023-01-16 (×3): qty 1

## 2023-01-16 NOTE — H&P (Signed)
CC: seizure like activity  History is obtained from: patient, chart review  HPI: Melanie Bell is a 32 y.o. female with PMH of chronic migraine, depression with suicidal attempts in past, hypothyroidism, GERD, asthma admitted to [facility] for characterization of seizure like episodes.   States started having seizure like episodes last year described as felling "weird" followed by whole body jerking, lasting upto 10 minutes, at times associated with bladder incontinence as well as right cheek bite once. States episodes used to happen once every week but since being on topamax, has been once every few weeks. Provoked by stress, lack of sleep, being upset. Happens more often in evening. States she had similar episodes in elementary school.   Seizure risk factor: Born at term via vaginal delivery, No NICU stay, denies febrile seizures, denies meningitis/encephalitis, had head injury at age 46/14 with loc and stiches in forehead due to bumping into something while running, no family history of epilepsy.  Current ASMs topamax 100mg  at bedtime and pregabalin 50mg  BID  No prior ASMs  EEG, Routine, Sleep-Deprived 07/22/2022: This is a normal awake and sleep record, without EEG evidence for focality or epileptiform discharges. Photic driving response was seen.   MRI Brain With and Without Contrast 07/14/2022: No evidence of an acute intracranial abnormality. Multifocal T2 FLAIR hyperintense signal abnormality within the cerebral white matter, overall moderate in severity, and advanced for age. Findings are similar to the prior brain MRI of 09/09/2019. These signal changes are nonspecific and differential considerations include chronic small vessel ischemic disease, sequelae of chronic migraine headaches, sequelae of a prior infectious/inflammatory process or sequelae of a demyelinating process (such as multiple sclerosis), among others.   MRI Brain with and without contrast 09/09/2019: Normal MRI of the  pituitary gland with and without contrast. Scattered foci of T2 hyperintensity predominantly within the deep and juxtacortical white matter of the cerebral hemispheres are nonspecific and more pronounced than expected for patient's age. Differential diagnosis is broad and include demyelinating disease, vasculitis, CADASIL in the setting of chronic migraine headache, and other inflammatory/post infectious processes.    ROS: All other systems reviewed and negative except as noted in the HPI.   Past Medical History:  Diagnosis Date   Allergy    Anxiety    Arthritis    Asthma    Depression    Foreign body aspiration    GERD (gastroesophageal reflux disease)    Hallucinations 09/30/2014   Sizoaffective   Hyperlipidemia    Hypertension    Hypoglycemia    Intentional ingestion of batteries 04/21/2016    2 AAA batteries and 1 thumb tack; intent to hurt herself/notes 04/21/2016   Seizure (HCC) 01/16/2023   Self-inflicted injury 11/23/2017   Suicidal ideation 01/02/2018   Tardive dyskinesia 10/2014   recent onset   Thyroid disease     Family History  Problem Relation Age of Onset   Depression Mother    Hypertension Mother    Sleep apnea Mother    Asthma Mother    COPD Mother    Diabetes Mother    Anxiety disorder Mother    Arthritis Mother    Heart failure Mother    Breast cancer Maternal Aunt        20's   Stroke Maternal Grandmother    Heart attack Maternal Grandfather     Social History:  reports that she quit smoking about 6 years ago. Her smoking use included cigarettes. She started smoking about 9 years ago. She has a 1.5  pack-year smoking history. She has been exposed to tobacco smoke. She has never used smokeless tobacco. She reports that she does not drink alcohol and does not use drugs.  Medications Prior to Admission  Medication Sig Dispense Refill Last Dose   albuterol (VENTOLIN HFA) 108 (90 Base) MCG/ACT inhaler 2 puffs four times a day as needed (Patient taking  differently: Inhale 2 puffs into the lungs every 8 (eight) hours as needed for wheezing or shortness of breath.) 18 g 1 Past Week   ARIPiprazole (ABILIFY) 5 MG tablet Take 1 tablet (5 mg total) by mouth daily. 30 tablet 3 01/16/2023   cabergoline (DOSTINEX) 0.5 MG tablet Take 1 tablet (0.5 mg total) by mouth once a week. 12 tablet 3 01/15/2023   Cyanocobalamin (VITAMIN B-12 PO) Take 1 capsule by mouth daily.   01/16/2023   escitalopram (LEXAPRO) 10 MG tablet Take 1 tablet (10 mg total) by mouth daily. 30 tablet 3 01/16/2023   furosemide (LASIX) 20 MG tablet Take 1 tablet (20 mg total) by mouth daily. 30 tablet 3 01/16/2023   levothyroxine (SYNTHROID) 75 MCG tablet Take 1 tablet (75 mcg total) by mouth daily before breakfast. 90 tablet 3 01/16/2023   lithium carbonate 300 MG capsule Take 1 capsule (300 mg total) by mouth 2 (two) times daily with a meal. 60 capsule 3 01/16/2023   Magnesium 500 MG CAPS Take 500 mg by mouth at bedtime.   01/15/2023   omeprazole (PRILOSEC) 20 MG capsule TAKE 1 CAPSULE (20 MG TOTAL) BY MOUTH DAILY. 90 capsule 2 01/16/2023   ondansetron (ZOFRAN-ODT) 4 MG disintegrating tablet Take 1 tablet (4 mg total) by mouth every 8 (eight) hours as needed. (Patient taking differently: Take 4 mg by mouth as needed for nausea or vomiting.) 20 tablet 0 01/15/2023   potassium chloride SA (KLOR-CON M) 10 MEQ tablet Take 1 tablet (10 mEq total) by mouth daily. 30 tablet 3 01/16/2023   SUMAtriptan (IMITREX) 20 MG/ACT nasal spray Place 20 mg into the nose as needed for migraine or headache.   unk   topiramate (TOPAMAX) 25 MG tablet Take 1 tablet (25 mg total) by mouth 2 (two) times daily. 60 tablet 3 01/16/2023   traZODone (DESYREL) 50 MG tablet Take 1 tablet (50 mg total) by mouth at bedtime as needed for sleep. 30 tablet 3 Past Week      Exam: Current vital signs: BP 132/76 (BP Location: Left Arm)   Pulse 71   Temp 98.1 F (36.7 C) (Oral)   Resp 20   Ht 5\' 7"  (1.702 m)   Wt 124.4 kg   SpO2 100%   BMI  42.95 kg/m  Vital signs in last 24 hours: Temp:  [98.1 F (36.7 C)-98.6 F (37 C)] 98.1 F (36.7 C) (09/02 1530) Pulse Rate:  [71-78] 71 (09/02 1530) Resp:  [15-20] 20 (09/02 1530) BP: (126-139)/(69-78) 132/76 (09/02 1530) SpO2:  [98 %-100 %] 100 % (09/02 1530) Weight:  [124.4 kg] 124.4 kg (09/02 1009)   Physical Exam  Constitutional: Appears well-developed and well-nourished.  Psych: Affect appropriate to situation Neuro: Mental Status: Patient is awake, alert, oriented to person, place, month, year, and situation. Patient is able to give a clear and coherent history. No signs of aphasia or neglect Cranial Nerves: II: Visual Fields are full. Pupils are equal, round, and reactive to light.   III,IV, VI: EOMI without ptosis or diploplia.  V: Facial sensation is symmetric to temperature VII: Facial movement is symmetric.  VIII: hearing is  intact to voice X: Uvula elevates symmetrically XI: Shoulder shrug is symmetric. XII: tongue is midline without atrophy or fasciculations.  Motor: Tone is normal. Bulk is normal. 5/5 strength was present in all four extremities. Cerebellar: FNF are intact bilaterally   I have reviewed labs in epic and the results pertinent to this consultation are: CBC:  Recent Labs  Lab 01/16/23 0939  WBC 6.9  NEUTROABS 4.9  HGB 13.2  HCT 40.9  MCV 92.7  PLT 197    Basic Metabolic Panel:  Lab Results  Component Value Date   NA 141 01/16/2023   K 4.1 01/16/2023   CO2 23 01/16/2023   GLUCOSE 95 01/16/2023   BUN 8 01/16/2023   CREATININE 0.71 01/16/2023   CALCIUM 9.5 01/16/2023   GFRNONAA >60 01/16/2023   GFRAA >60 01/31/2018   Lipid Panel:  Lab Results  Component Value Date   LDLCALC 120 (H) 11/11/2022   HgbA1c:  Lab Results  Component Value Date   HGBA1C 5.1 11/23/2017   Urine Drug Screen:     Component Value Date/Time   LABOPIA NONE DETECTED 01/16/2023 1102   COCAINSCRNUR NONE DETECTED 01/16/2023 1102   COCAINSCRNUR NONE  DETECTED 11/08/2022 0007   LABBENZ NONE DETECTED 01/16/2023 1102   AMPHETMU NONE DETECTED 01/16/2023 1102   THCU NONE DETECTED 01/16/2023 1102   LABBARB NONE DETECTED 01/16/2023 1102    Alcohol Level     Component Value Date/Time   ETH <10 11/07/2022 1743     I have reviewed the images obtained:  MRI Brain With and Without Contrast 07/14/2022: No evidence of an acute intracranial abnormality. Multifocal T2 FLAIR hyperintense signal abnormality within the cerebral white matter, overall moderate in severity, and advanced for age. Findings are similar to the prior brain MRI of 09/09/2019. These signal changes are nonspecific and differential considerations include chronic small vessel ischemic disease, sequelae of chronic migraine headaches, sequelae of a prior infectious/inflammatory process or sequelae of a demyelinating process (such as multiple sclerosis), among others.   MRI Brain with and without contrast 09/09/2019: Normal MRI of the pituitary gland with and without contrast. Scattered foci of T2 hyperintensity predominantly within the deep and juxtacortical white matter of the cerebral hemispheres are nonspecific and more pronounced than expected for patient's age. Differential diagnosis is broad and include demyelinating disease, vasculitis, CADASIL in the setting of chronic migraine headache, and other inflammatory/post infectious processes.   ASSESSMENT/PLAN: 32yo F with multiple comorbidities as noted in HPI admitted to EMU for characterization of spells  Seizure - start video eeg for characterization for spells - hold topamax and pregabalin for seizure provocation - plan for HV, photic stimulation and sleep deprivation tomorrow - seizure precautions - prn iv versed for clinical seizure lasting over 2 minutes  Depression - continue home meds  Migraine - hold topamax, continue prn Imitrex  Asthma - Continue inhalers/nebs  GERD - protonix continued  Cloud Graham Ep[facility]

## 2023-01-16 NOTE — Progress Notes (Signed)
vLTM setup  All impedances below 10kohms.  [facility] monitoring.  Patient instructed on use of patient event button

## 2023-01-17 ENCOUNTER — Ambulatory Visit: Payer: Medicaid Other

## 2023-01-17 DIAGNOSIS — R569 Unspecified convulsions: Secondary | ICD-10-CM | POA: Diagnosis not present

## 2023-01-17 LAB — PREGNANCY, URINE: Preg Test, Ur: NEGATIVE

## 2023-01-17 NOTE — Progress Notes (Signed)
Subjective: Had shaking while undergoing photic stimulation.  States it is similar to her typical episodes just less intense.  Reported headache after the event.  ROS: negative except above  Examination  Vital signs in last 24 hours: Temp:  [98.1 F (36.7 C)-98.5 F (36.9 C)] 98.5 F (36.9 C) (09/03 1152) Pulse Rate:  [67-71] 71 (09/03 1152) Resp:  [14-20] 18 (09/03 1152) BP: (106-145)/(68-83) 106/70 (09/03 1152) SpO2:  [97 %-100 %] 99 % (09/03 1152)  General: lying in bed, NAD Neuro: MS: Alert, oriented, follows commands CN: pupils equal and reactive,  EOMI, face symmetric, tongue midline, normal sensation over face, Motor: 5/5 strength in all 4 extremities Reflexes: 2+ bilaterally over patella, biceps, plantars: flexor Coordination: normal Gait: not tested  Basic Metabolic Panel: Recent Labs  Lab 01/16/23 0939  NA 141  K 4.1  CL 102  CO2 23  GLUCOSE 95  BUN 8  CREATININE 0.71  CALCIUM 9.5  MG 1.8  PHOS 2.2*    CBC: Recent Labs  Lab 01/16/23 0939  WBC 6.9  NEUTROABS 4.9  HGB 13.2  HCT 40.9  MCV 92.7  PLT 197     Coagulation Studies: Recent Labs    01/16/23 0939  LABPROT 13.1  INR 1.0    Imaging No new brain imaging overnight  ASSESSMENT AND PLAN:  32yo F with multiple comorbidities as noted in HPI admitted to EMU for characterization of spells   Seizure -Continue video eeg for characterization for spells -Continue to hold topamax and pregabalin for seizure provocation - plan for HV, photic stimulation and sleep deprivation today -Discussed overnight EEG findings  - seizure precautions - prn iv versed for clinical seizure lasting over 2 minutes   Depression - continue home meds   Migraine - hold topamax, continue prn Imitrex   Asthma - Continue inhalers/nebs   GERD - protonix continued  I have spent a total of  36  minutes with the patient reviewing hospital notes,  test results, labs and examining the patient as well as  establishing an assessment and plan that was discussed personally with the patient.  > 50% of time was spent in direct patient care.         Lindie Spruce Epilepsy [facility] For questions after 5pm please refer to AMION to reach the Neurologist on call

## 2023-01-17 NOTE — TOC CM/SW Note (Signed)
Transition of Care [facility]) - Inpatient Brief Assessment   Patient Details  [name] MRN: [id number] Date of Birth: 14-Sep-1990  [facility]) CM/SW Contact:    Kermit Balo, RN Phone Number: 01/17/2023, 2:58 PM   Clinical Narrative: EMU patient.   Transition of Care Asessment: Insurance and Status: Insurance coverage has been reviewed Patient has primary care physician: Yes Home environment has been reviewed: home with spouse   Prior/Current Home Services: No current home services Social Determinants of Health Reivew: SDOH reviewed no interventions necessary Readmission risk has been reviewed: Yes Transition of care needs: no transition of care needs at this time

## 2023-01-17 NOTE — Procedures (Signed)
Patient [name]  MRN: [id number]  Epilepsy Attending: Charlsie Quest  Referring Physician/Provider: Charlsie Quest, MD  Duration: 01/16/2023 3220 to 01/17/2023 2542  Patient history: 32yo F getting eeg to evaluate for seizure like activity.   Level of alertness: Awake, asleep  AEDs during EEG study: TPM  Technical aspects: This EEG study was done with scalp electrodes positioned according to the 10-20 International system of electrode placement. Electrical activity was reviewed with band pass filter of 1-70Hz , sensitivity of 7 uV/mm, display speed of 20mm/sec with a 60Hz  notched filter applied as appropriate. EEG data were recorded continuously and digitally stored.  Video monitoring was available and reviewed as appropriate.  Description: The posterior dominant rhythm consists of 10 Hz activity of moderate voltage (25-35 uV) seen predominantly in posterior head regions, symmetric and reactive to eye opening and eye closing. Sleep was characterized by vertex waves, sleep spindles (12 to 14 Hz), maximal frontocentral region. Hyperventilation and photic stimulation were not performed.     IMPRESSION: This study is within normal limits. No seizures or epileptiform discharges were seen throughout the recording.  A normal interictal EEG does not exclude the diagnosis of epilepsy.  Melanie Bell

## 2023-01-17 NOTE — Progress Notes (Signed)
EMU   photic stimulation and hyperventilation done.  Notified Neuro of abnormal responses during photic stimulation

## 2023-01-18 ENCOUNTER — Ambulatory Visit: Payer: Medicaid Other

## 2023-01-18 DIAGNOSIS — R569 Unspecified convulsions: Secondary | ICD-10-CM | POA: Diagnosis not present

## 2023-01-18 NOTE — Progress Notes (Signed)
Subjective: NAEO. Reports feeling very tired  ROS: negative except above  Examination  Vital signs in last 24 hours: Temp:  [98 F (36.7 C)-98.8 F (37.1 C)] 98.8 F (37.1 C) (09/04 1932) Pulse Rate:  [64-94] 94 (09/04 1932) Resp:  [15-18] 16 (09/04 1932) BP: (114-131)/(61-76) 122/76 (09/04 1932) SpO2:  [96 %-99 %] 97 % (09/04 1932)  General: lying in bed, NAD Neuro: MS: Alert, oriented, follows commands CN: pupils equal and reactive,  EOMI, face symmetric, tongue midline, normal sensation over face, Motor: 5/5 strength in all 4 extremities Coordination: normal Gait: not tested  Basic Metabolic Panel: Recent Labs  Lab 01/16/23 0939  NA 141  K 4.1  CL 102  CO2 23  GLUCOSE 95  BUN 8  CREATININE 0.71  CALCIUM 9.5  MG 1.8  PHOS 2.2*    CBC: Recent Labs  Lab 01/16/23 0939  WBC 6.9  NEUTROABS 4.9  HGB 13.2  HCT 40.9  MCV 92.7  PLT 197     Coagulation Studies: Recent Labs    01/16/23 0939  LABPROT 13.1  INR 1.0    Imaging No new brain imaging overnight   ASSESSMENT AND PLAN:  32yo F with multiple comorbidities as noted in HPI admitted to EMU for characterization of spells   Seizure -Continue video eeg for characterization for spells -Continue to hold topamax and pregabalin for seizure provocation -Discussed overnight EEG findings  - seizure precautions - prn iv versed for clinical seizure lasting over 2 minutes   Depression - continue home meds   Migraine - hold topamax, continue prn Imitrex   Asthma - Continue inhalers/nebs   GERD - protonix continued   I have spent a total of  26  minutes with the patient reviewing hospital notes,  test results, labs and examining the patient as well as establishing an assessment and plan that was discussed personally with the patient.  > 50% of time was spent in direct patient care.     Lindie Spruce Epilepsy [facility] For questions after 5pm please refer to AMION to reach the  Neurologist on call

## 2023-01-18 NOTE — Plan of Care (Signed)

## 2023-01-18 NOTE — Procedures (Signed)
Patient [name]  MRN: [id number]  Epilepsy Attending: Charlsie Quest  Referring Physician/Provider: Charlsie Quest, MD  Duration: 01/17/2023 7829 to 01/18/2023 5621   Patient history: 32yo F getting eeg to evaluate for seizure like activity.    Level of alertness: Awake, asleep   AEDs during EEG study: TPM   Technical aspects: This EEG study was done with scalp electrodes positioned according to the 10-20 International system of electrode placement. Electrical activity was reviewed with band pass filter of 1-70Hz , sensitivity of 7 uV/mm, display speed of 25mm/sec with a 60Hz  notched filter applied as appropriate. EEG data were recorded continuously and digitally stored.  Video monitoring was available and reviewed as appropriate.   Description: The posterior dominant rhythm consists of 10 Hz activity of moderate voltage (25-35 uV) seen predominantly in posterior head regions, symmetric and reactive to eye opening and eye closing. Sleep was characterized by vertex waves, sleep spindles (12 to 14 Hz), maximal frontocentral region.  Physiologic photic driving was seen during photic stimulation.  No EEG changes seen during hyperventilation.  During photic stimulation at 17 Hz, patient was noted to have subtle intermittent right arm twitching, mouth twitching, hyperventilating as well as tears coming from her eyes.  Patient was initially not responding.  Eventually  reported not feeling good and headache. Concomitant EEG before, during and after the event did not show any EEG change to suggest seizure.   IMPRESSION: This study is within normal limits. No seizures or epileptiform discharges were seen throughout the recording.  One event was recorded during photic stimulation as described above without concomitant EEG change.  This was a nonepileptic event.   Mathieu Schloemer Annabelle Harman

## 2023-01-18 NOTE — Progress Notes (Signed)
vLTM maintenance  All impedances below 10kohms.  No skin breakdown at FP1  FP2  A2  C4

## 2023-01-19 DIAGNOSIS — R569 Unspecified convulsions: Secondary | ICD-10-CM | POA: Diagnosis not present

## 2023-01-19 NOTE — TOC Transition Note (Signed)
Transition of Care [facility]) - CM/SW Discharge Note   Patient Details  Name: Delonna Lessing MRN: [id number] Date of Birth: 08/03/1990  [facility] CM/SW Contact:  Kermit Balo, RN Phone Number: 01/19/2023, 11:45 AM   Clinical Narrative:    Pt is discharging home with self care. No needs per TOC.   Final next level of care: Home/Self Care Barriers to Discharge: No Barriers Identified   Patient Goals and CMS Choice      Discharge Placement                         Discharge Plan and Services Additional resources added to the After Visit Summary for                                       Social Determinants of Health (SDOH) Interventions SDOH Screenings   Food Insecurity: Patient Declined (01/16/2023)  Housing: Patient Declined (01/16/2023)  Transportation Needs: Patient Declined (01/16/2023)  Utilities: Patient Declined (01/16/2023)  Alcohol Screen: Low Risk  (01/11/2023)  Depression (PHQ2-9): Low Risk  (01/11/2023)  Financial Resource Strain: Low Risk  (01/11/2023)  Physical Activity: Sufficiently Active (01/11/2023)  Recent Concern: Physical Activity - Insufficiently Active (12/27/2022)  Social Connections: Moderately Integrated (01/11/2023)  Stress: No Stress Concern Present (01/11/2023)  Tobacco Use: Medium Risk (01/16/2023)  Health Literacy: Adequate Health Literacy (01/11/2023)     Readmission Risk Interventions     No data to display

## 2023-01-19 NOTE — Progress Notes (Signed)
Nursing Discharge Note   [name] MRN: [id number] DOB: [birth year]    Admit Date: 01/16/2023 Discharge Date: 01/19/2023  Melanie Bell is to be discharged home per MD order.  AVS completed. Reviewed with patient and family at bedside. Highlighted copy provided for patient to take home.  Patient/caregiver able to verbalize understanding of discharge instructions. PIV removed. Patient stable upon discharge.   Discharge Instructions     Call MD for:   Complete by: As directed    If patient has another seizure, call 911 and bring them back to the ED if: A.  The seizure lasts longer than 5 minutes.      B.  The patient doesn't wake shortly after the seizure or has new problems such as difficulty seeing, speaking or moving following the seizure C.  The patient was injured during the seizure D.  The patient has a temperature over 102 F (39C) E.  The patient vomited during the seizure and now is having trouble breathing      Diet - low sodium heart healthy   Complete by: As directed    Discharge instructions   Complete by: As directed    Seizure precautions: Per [facility] statutes, patients with seizures are not allowed to drive until they have been seizure-free for six months and cleared by a physician    Use caution when using heavy equipment or power tools. Avoid working on ladders or at heights. Take showers instead of baths. Ensure the water temperature is not too high on the home water heater. Do not go swimming alone. Do not lock yourself in a room alone (i.e. bathroom). When caring for infants or small children, sit down when holding, feeding, or changing them to minimize risk of injury to the child in the event you have a seizure. Maintain good sleep hygiene. Avoid alcohol.      Increase activity slowly   Complete by: As directed        Allergies as of 01/19/2023       Reactions   Betadine [povidone Iodine] Hives   Fish Allergy Hives    Shellfish-derived Products Hives        Medication List     STOP taking these medications    topiramate 25 MG tablet Commonly known as: TOPAMAX       TAKE these medications    albuterol 108 (90 Base) MCG/ACT inhaler Commonly known as: VENTOLIN HFA 2 puffs four times a day as needed What changed:  how much to take how to take this when to take this reasons to take this additional instructions   ARIPiprazole 5 MG tablet Commonly known as: ABILIFY Take 1 tablet (5 mg total) by mouth daily.   cabergoline 0.5 MG tablet Commonly known as: DOSTINEX Take 1 tablet (0.5 mg total) by mouth once a week.   escitalopram 10 MG tablet Commonly known as: LEXAPRO Take 1 tablet (10 mg total) by mouth daily.   furosemide 20 MG tablet Commonly known as: LASIX Take 1 tablet (20 mg total) by mouth daily.   levothyroxine 75 MCG tablet Commonly known as: SYNTHROID Take 1 tablet (75 mcg total) by mouth daily before breakfast.   lithium carbonate 300 MG capsule Take 1 capsule (300 mg total) by mouth 2 (two) times daily with a meal.   Magnesium 500 MG Caps Take 500 mg by mouth at bedtime.   omeprazole 20 MG capsule Commonly known as: PRILOSEC TAKE 1 CAPSULE (20 MG TOTAL)  BY MOUTH DAILY.   ondansetron 4 MG disintegrating tablet Commonly known as: ZOFRAN-ODT Take 1 tablet (4 mg total) by mouth every 8 (eight) hours as needed. What changed:  when to take this reasons to take this   potassium chloride 10 MEQ tablet Commonly known as: KLOR-CON M Take 1 tablet (10 mEq total) by mouth daily.   SUMAtriptan 20 MG/ACT nasal spray Commonly known as: IMITREX Place 20 mg into the nose as needed for migraine or headache.   traZODone 50 MG tablet Commonly known as: DESYREL Take 1 tablet (50 mg total) by mouth at bedtime as needed for sleep.   VITAMIN B-12 PO Take 1 capsule by mouth daily.         Discharge Instructions/ Education: Discharge instructions given to  patient/family with verbalized understanding. Discharge education completed with patient/family including: follow up instructions, medication list, discharge activities, and limitations if indicated. Patient instructed to return to Emergency Department, call 911, or call MD for any changes in condition.  Patient escorted via wheelchair to lobby and discharged home via private automobile.

## 2023-01-19 NOTE — Discharge Instructions (Addendum)
You were admitted to epilepsy monitoring unit between 01/16/2023 to 01/19/2023.  During this time he underwent continuous video EEG monitoring.  Your Topamax and pregabalin were held.  Hyperventilation, photic stimulation and sleep deprivation were performed.  1 typical event was recorded without EEG change.  This was not an epileptic event.  Rest of the EEG was within normal limits.  We discussed the diagnosis of nonepileptic events.  During his mission, he also mentioned that you are planning to have a child.  We discussed the potential teratogenicity of Topamax.  In your case this is mainly used for headache.  Therefore you requested to hold off the medication and talk to your neurologist about this.  I agree with this plan.  We did resume your pregabalin.  I also discussed cognitive behavioral therapy for managing her nonepileptic events.

## 2023-01-19 NOTE — Procedures (Addendum)
Patient Name: Aliz Musgrave  MRN: [id number]  Epilepsy Attending: Charlsie Quest  Referring Physician/Provider: Charlsie Quest, MD  Duration: 01/18/2023 3664 to 01/19/2023 4034   Patient history: 32yo F getting eeg to evaluate for seizure like activity.    Level of alertness: Awake, asleep   AEDs during EEG study: None   Technical aspects: This EEG study was done with scalp electrodes positioned according to the 10-20 International system of electrode placement. Electrical activity was reviewed with band pass filter of 1-70Hz , sensitivity of 7 uV/mm, display speed of 63mm/sec with a 60Hz  notched filter applied as appropriate. EEG data were recorded continuously and digitally stored.  Video monitoring was available and reviewed as appropriate.   Description: The posterior dominant rhythm consists of 10 Hz activity of moderate voltage (25-35 uV) seen predominantly in posterior head regions, symmetric and reactive to eye opening and eye closing. Sleep was characterized by vertex waves, sleep spindles (12 to 14 Hz), maximal frontocentral region.    IMPRESSION: This study is within normal limits. No seizures or epileptiform discharges were seen throughout the recording.  Swetha Rayle Annabelle Harman

## 2023-01-19 NOTE — Discharge Summary (Signed)
Physician Discharge Summary  Patient ID: Melanie Bell MRN: [id number] DOB/AGE: [birth year] 33 y.o.  Admit date: 01/16/2023 Discharge date: 01/19/2023  Admission Diagnoses: Seizure  Discharge Diagnoses: Psychogenic nonepileptic spells  Discharged Condition: stable  Hospital Course: 32 year old female admitted to epilepsy monitoring unit from 01/16/2023 to 01/19/2023.  During this time, she underwent continuous video EEG monitoring.  Topamax and pregabalin were held.  Hyperventilation, photic stimulation and sleep deprivation were performed.  1 typical event was recorded without concomitant EEG change.  This was a nonepileptic event.  Rest of the EEG was within normal limits. Diagnosis of nonepileptic events was discussed with patient and cognitive behavioral therapy was recommended.  During the admission, patient also mentioned that she is planning to have a child.  I discussed the potential teratogenicity of Topamax.  She states it is being used for headache and therefore would like to hold it off for now and discussed with her neurologist.   Consults: None  Significant Diagnostic Studies: Video EEG  Description: The posterior dominant rhythm consists of 10 Hz activity of moderate voltage (25-35 uV) seen predominantly in posterior head regions, symmetric and reactive to eye opening and eye closing. Sleep was characterized by vertex waves, sleep spindles (12 to 14 Hz), maximal frontocentral region.  Physiologic photic driving was seen during photic stimulation.  No EEG changes seen during hyperventilation.   During photic stimulation at 17 Hz, patient was noted to have subtle intermittent right arm twitching, mouth twitching, hyperventilating as well as tears coming from her eyes.  Patient was initially not responding.  Eventually  reported not feeling good and headache. Concomitant EEG before, during and after the event did not show any EEG change to suggest seizure.   IMPRESSION: This study  is within normal limits. No seizures or epileptiform discharges were seen throughout the recording.   One event was recorded during photic stimulation as described above without concomitant EEG change.  This was a nonepileptic event.    Treatments:  discontinue Topamax  Discharge Exam: Blood pressure 115/69, pulse 80, temperature 98.2 F (36.8 C), temperature source Oral, resp. rate 19, height 5\' 7"  (1.702 m), weight 124.4 kg, SpO2 100%  General: lying in bed, NAD Neuro: MS: Alert, oriented, follows commands CN: pupils equal and reactive,  EOMI, face symmetric, tongue midline, normal sensation over face, Motor: 5/5 strength in all 4 extremities Coordination: normal Gait: not tested  Discharge disposition: 01-Home or Self Care   Discharge Instructions     Call MD for:   Complete by: As directed    If patient has another seizure, call 911 and bring them back to the ED if: A.  The seizure lasts longer than 5 minutes.      B.  The patient doesn't wake shortly after the seizure or has new problems such as difficulty seeing, speaking or moving following the seizure C.  The patient was injured during the seizure D.  The patient has a temperature over 102 F (39C) E.  The patient vomited during the seizure and now is having trouble breathing      Diet - low sodium heart healthy   Complete by: As directed    Discharge instructions   Complete by: As directed    Seizure precautions: Per [facility] statutes, patients with seizures are not allowed to drive until they have been seizure-free for six months and cleared by a physician    Use caution when using heavy equipment or power tools. Avoid working on Paediatric nurse  or at heights. Take showers instead of baths. Ensure the water temperature is not too high on the home water heater. Do not go swimming alone. Do not lock yourself in a room alone (i.e. bathroom). When caring for infants or small children, sit down when holding, feeding, or  changing them to minimize risk of injury to the child in the event you have a seizure. Maintain good sleep hygiene. Avoid alcohol.      Increase activity slowly   Complete by: As directed       Allergies as of 01/19/2023       Reactions   Betadine [povidone Iodine] Hives   Fish Allergy Hives   Shellfish-derived Products Hives        Medication List     STOP taking these medications    topiramate 25 MG tablet Commonly known as: TOPAMAX       TAKE these medications    albuterol 108 (90 Base) MCG/ACT inhaler Commonly known as: VENTOLIN HFA 2 puffs four times a day as needed What changed:  how much to take how to take this when to take this reasons to take this additional instructions   ARIPiprazole 5 MG tablet Commonly known as: ABILIFY Take 1 tablet (5 mg total) by mouth daily.   cabergoline 0.5 MG tablet Commonly known as: DOSTINEX Take 1 tablet (0.5 mg total) by mouth once a week.   escitalopram 10 MG tablet Commonly known as: LEXAPRO Take 1 tablet (10 mg total) by mouth daily.   furosemide 20 MG tablet Commonly known as: LASIX Take 1 tablet (20 mg total) by mouth daily.   levothyroxine 75 MCG tablet Commonly known as: SYNTHROID Take 1 tablet (75 mcg total) by mouth daily before breakfast.   lithium carbonate 300 MG capsule Take 1 capsule (300 mg total) by mouth 2 (two) times daily with a meal.   Magnesium 500 MG Caps Take 500 mg by mouth at bedtime.   omeprazole 20 MG capsule Commonly known as: PRILOSEC TAKE 1 CAPSULE (20 MG TOTAL) BY MOUTH DAILY.   ondansetron 4 MG disintegrating tablet Commonly known as: ZOFRAN-ODT Take 1 tablet (4 mg total) by mouth every 8 (eight) hours as needed. What changed:  when to take this reasons to take this   potassium chloride 10 MEQ tablet Commonly known as: KLOR-CON M Take 1 tablet (10 mEq total) by mouth daily.   SUMAtriptan 20 MG/ACT nasal spray Commonly known as: IMITREX Place 20 mg into the nose as  needed for migraine or headache.   traZODone 50 MG tablet Commonly known as: DESYREL Take 1 tablet (50 mg total) by mouth at bedtime as needed for sleep.   VITAMIN B-12 PO Take 1 capsule by mouth daily.        I have spent a total of  38  minutes with the patient reviewing hospital notes,  test results, labs and examining the patient as well as establishing an assessment and plan that was discussed personally with the patient.  > 50% of time was spent in direct patient care.     Signed: Charlsie Quest 01/19/2023, 11:06 AM

## 2023-01-19 NOTE — Procedures (Signed)
Patient [name]  MRN: [id number]  Epilepsy Attending: Charlsie Quest  Referring Physician/Provider: Charlsie Quest, MD  Duration: 01/19/2023 7829 to 01/19/2023 1303   Patient history: 32yo F getting eeg to evaluate for seizure like activity.    Level of alertness: Awake   AEDs during EEG study: None   Technical aspects: This EEG study was done with scalp electrodes positioned according to the 10-20 International system of electrode placement. Electrical activity was reviewed with band pass filter of 1-70Hz , sensitivity of 7 uV/mm, display speed of 89mm/sec with a 60Hz  notched filter applied as appropriate. EEG data were recorded continuously and digitally stored.  Video monitoring was available and reviewed as appropriate.   Description: The posterior dominant rhythm consists of 10 Hz activity of moderate voltage (25-35 uV) seen predominantly in posterior head regions, symmetric and reactive to eye opening and eye closing.    IMPRESSION: This study is within normal limits. No seizures or epileptiform discharges were seen throughout the recording.   [name]

## 2023-01-19 NOTE — Progress Notes (Signed)
LTM EEG discontinued - no skin breakdown at unhook.   

## 2023-01-19 NOTE — Progress Notes (Signed)
LTM Maintenance P8 and A2, no skin breakdown noted, all impedances 10k,

## 2023-01-20 ENCOUNTER — Encounter: Payer: Self-pay | Admitting: *Deleted

## 2023-01-20 ENCOUNTER — Inpatient Hospital Stay
Admission: EM | Admit: 2023-01-20 | Discharge: 2023-01-24 | DRG: 918 | Disposition: A | Discharge status: Other Acute Inpatient Hospital | Payer: Medicaid Other | Attending: Internal Medicine | Admitting: Internal Medicine

## 2023-01-20 ENCOUNTER — Ambulatory Visit: Payer: Medicaid Other | Admitting: Cardiology

## 2023-01-20 ENCOUNTER — Other Ambulatory Visit: Payer: Self-pay

## 2023-01-20 DIAGNOSIS — R112 Nausea with vomiting, unspecified: Secondary | ICD-10-CM | POA: Diagnosis not present

## 2023-01-20 DIAGNOSIS — M1990 Unspecified osteoarthritis, unspecified site: Secondary | ICD-10-CM | POA: Diagnosis present

## 2023-01-20 DIAGNOSIS — D72829 Elevated white blood cell count, unspecified: Secondary | ICD-10-CM | POA: Diagnosis present

## 2023-01-20 DIAGNOSIS — T43592A Poisoning by other antipsychotics and neuroleptics, intentional self-harm, initial encounter: Principal | ICD-10-CM | POA: Diagnosis present

## 2023-01-20 DIAGNOSIS — G43009 Migraine without aura, not intractable, without status migrainosus: Secondary | ICD-10-CM | POA: Diagnosis present

## 2023-01-20 DIAGNOSIS — F4310 Post-traumatic stress disorder, unspecified: Secondary | ICD-10-CM | POA: Diagnosis present

## 2023-01-20 DIAGNOSIS — K219 Gastro-esophageal reflux disease without esophagitis: Secondary | ICD-10-CM | POA: Diagnosis present

## 2023-01-20 DIAGNOSIS — Z833 Family history of diabetes mellitus: Secondary | ICD-10-CM

## 2023-01-20 DIAGNOSIS — I10 Essential (primary) hypertension: Secondary | ICD-10-CM | POA: Diagnosis present

## 2023-01-20 DIAGNOSIS — T503X2A Poisoning by electrolytic, caloric and water-balance agents, intentional self-harm, initial encounter: Secondary | ICD-10-CM | POA: Diagnosis present

## 2023-01-20 DIAGNOSIS — T43222A Poisoning by selective serotonin reuptake inhibitors, intentional self-harm, initial encounter: Secondary | ICD-10-CM | POA: Diagnosis present

## 2023-01-20 DIAGNOSIS — Z825 Family history of asthma and other chronic lower respiratory diseases: Secondary | ICD-10-CM

## 2023-01-20 DIAGNOSIS — Z560 Unemployment, unspecified: Secondary | ICD-10-CM

## 2023-01-20 DIAGNOSIS — Z91013 Allergy to seafood: Secondary | ICD-10-CM

## 2023-01-20 DIAGNOSIS — Z818 Family history of other mental and behavioral disorders: Secondary | ICD-10-CM

## 2023-01-20 DIAGNOSIS — J4520 Mild intermittent asthma, uncomplicated: Secondary | ICD-10-CM | POA: Diagnosis present

## 2023-01-20 DIAGNOSIS — Z8249 Family history of ischemic heart disease and other diseases of the circulatory system: Secondary | ICD-10-CM

## 2023-01-20 DIAGNOSIS — T426X2A Poisoning by other antiepileptic and sedative-hypnotic drugs, intentional self-harm, initial encounter: Secondary | ICD-10-CM | POA: Diagnosis present

## 2023-01-20 DIAGNOSIS — Z823 Family history of stroke: Secondary | ICD-10-CM

## 2023-01-20 DIAGNOSIS — Z1152 Encounter for screening for COVID-19: Secondary | ICD-10-CM

## 2023-01-20 DIAGNOSIS — Z765 Malingerer [conscious simulation]: Secondary | ICD-10-CM

## 2023-01-20 DIAGNOSIS — Z87891 Personal history of nicotine dependence: Secondary | ICD-10-CM

## 2023-01-20 DIAGNOSIS — Z79890 Hormone replacement therapy (postmenopausal): Secondary | ICD-10-CM

## 2023-01-20 DIAGNOSIS — T381X2A Poisoning by thyroid hormones and substitutes, intentional self-harm, initial encounter: Secondary | ICD-10-CM | POA: Diagnosis present

## 2023-01-20 DIAGNOSIS — E785 Hyperlipidemia, unspecified: Secondary | ICD-10-CM | POA: Diagnosis present

## 2023-01-20 DIAGNOSIS — Z8261 Family history of arthritis: Secondary | ICD-10-CM

## 2023-01-20 DIAGNOSIS — T50902A Poisoning by unspecified drugs, medicaments and biological substances, intentional self-harm, initial encounter: Secondary | ICD-10-CM

## 2023-01-20 DIAGNOSIS — T50912A Poisoning by multiple unspecified drugs, medicaments and biological substances, intentional self-harm, initial encounter: Secondary | ICD-10-CM | POA: Diagnosis present

## 2023-01-20 DIAGNOSIS — E039 Hypothyroidism, unspecified: Secondary | ICD-10-CM | POA: Diagnosis present

## 2023-01-20 DIAGNOSIS — R45851 Suicidal ideations: Principal | ICD-10-CM

## 2023-01-20 DIAGNOSIS — Z59819 Housing instability, housed unspecified: Secondary | ICD-10-CM

## 2023-01-20 DIAGNOSIS — R000 Tachycardia, unspecified: Secondary | ICD-10-CM | POA: Diagnosis present

## 2023-01-20 DIAGNOSIS — Z79899 Other long term (current) drug therapy: Secondary | ICD-10-CM

## 2023-01-20 DIAGNOSIS — G4700 Insomnia, unspecified: Secondary | ICD-10-CM | POA: Diagnosis present

## 2023-01-20 DIAGNOSIS — Z803 Family history of malignant neoplasm of breast: Secondary | ICD-10-CM

## 2023-01-20 DIAGNOSIS — F603 Borderline personality disorder: Secondary | ICD-10-CM | POA: Diagnosis present

## 2023-01-20 DIAGNOSIS — F419 Anxiety disorder, unspecified: Secondary | ICD-10-CM | POA: Diagnosis present

## 2023-01-20 DIAGNOSIS — Z9151 Personal history of suicidal behavior: Secondary | ICD-10-CM

## 2023-01-20 DIAGNOSIS — F250 Schizoaffective disorder, bipolar type: Secondary | ICD-10-CM | POA: Diagnosis present

## 2023-01-20 DIAGNOSIS — T501X2A Poisoning by loop [high-ceiling] diuretics, intentional self-harm, initial encounter: Secondary | ICD-10-CM | POA: Diagnosis present

## 2023-01-20 DIAGNOSIS — Z883 Allergy status to other anti-infective agents status: Secondary | ICD-10-CM

## 2023-01-20 LAB — COMPREHENSIVE METABOLIC PANEL
ALT: 14 U/L (ref 0–44)
AST: 20 U/L (ref 15–41)
Albumin: 4.4 g/dL (ref 3.5–5.0)
Alkaline Phosphatase: 66 U/L (ref 38–126)
Anion gap: 12 (ref 5–15)
BUN: 18 mg/dL (ref 6–20)
CO2: 25 mmol/L (ref 22–32)
Calcium: 9.8 mg/dL (ref 8.9–10.3)
Chloride: 102 mmol/L (ref 98–111)
Creatinine, Ser: 0.84 mg/dL (ref 0.44–1.00)
GFR, Estimated: >60 mL/min (ref >60)
Glucose, Bld: 111 mg/dL — ABNORMAL HIGH (ref 70–99)
Potassium: 3.9 mmol/L (ref 3.5–5.1)
Sodium: 139 mmol/L (ref 135–145)
Total Bilirubin: 0.5 mg/dL (ref 0.3–1.2)
Total Protein: 8.1 g/dL (ref 6.5–8.1)

## 2023-01-20 LAB — CBC
HCT: 45.4 % (ref 36.0–46.0)
Hemoglobin: 14.8 g/dL (ref 12.0–15.0)
MCH: 30.5 pg (ref 26.0–34.0)
MCHC: 32.6 g/dL (ref 30.0–36.0)
MCV: 93.4 fL (ref 80.0–100.0)
Platelets: 226 10*3/uL (ref 150–400)
RBC: 4.86 MIL/uL (ref 3.87–5.11)
RDW: 12.8 % (ref 11.5–15.5)
WBC: 11.1 10*3/uL — ABNORMAL HIGH (ref 4.0–10.5)
nRBC: 0 % (ref 0.0–0.2)

## 2023-01-20 LAB — URINE DRUG SCREEN, QUALITATIVE (ARMC ONLY)
Amphetamines, Ur Screen: NOT DETECTED
Barbiturates, Ur Screen: NOT DETECTED
Benzodiazepine, Ur Scrn: NOT DETECTED
Cannabinoid 50 Ng, Ur ~~LOC~~: NOT DETECTED
Cocaine Metabolite,Ur ~~LOC~~: NOT DETECTED
MDMA (Ecstasy)Ur Screen: NOT DETECTED
Methadone Scn, Ur: NOT DETECTED
Opiate, Ur Screen: NOT DETECTED
Phencyclidine (PCP) Ur S: NOT DETECTED
Tricyclic, Ur Screen: NOT DETECTED

## 2023-01-20 LAB — CBG MONITORING, ED: Glucose-Capillary: 110 mg/dL — ABNORMAL HIGH (ref 70–99)

## 2023-01-20 LAB — POC URINE PREG, ED: Preg Test, Ur: NEGATIVE

## 2023-01-20 LAB — SALICYLATE LEVEL: Salicylate Lvl: <7 mg/dL — ABNORMAL LOW (ref 7.0–30.0)

## 2023-01-20 LAB — ACETAMINOPHEN LEVEL: Acetaminophen (Tylenol), Serum: <10 ug/mL — ABNORMAL LOW (ref 10–30)

## 2023-01-20 LAB — ETHANOL: Alcohol, Ethyl (B): <10 mg/dL (ref <10)

## 2023-01-20 LAB — LITHIUM LEVEL: Lithium Lvl: 0.86 mmol/L (ref 0.60–1.20)

## 2023-01-20 MED ORDER — SODIUM CHLORIDE 0.9 % IV SOLN: Freq: Once | INTRAVENOUS | Status: AC

## 2023-01-20 NOTE — ED Provider Notes (Signed)
[facility] Provider Note    Event Date/Time   First MD Initiated Contact with Patient 01/20/23 2000     (approximate)   History   Drug Overdose   HPI  Melanie Bell is a 32 y.o. female with PTSD, borderline personality disorder, schizoaffective disorder, malingering who presents after intentional drug overdose.  Reportedly the patient took Ariprazole 35mg  Cabergoline 0.5mg  Escitalopram 70mg  Furosemide 140mg  Levothyroxine 525mg  Lithium Carbonate 4200mg  Potassium Cl Topiramate 350mg    In an attempt to harm herself.     Physical Exam   Triage Vital Signs: ED Triage Vitals  Encounter Vitals Group     BP 01/20/23 1858 124/79     Systolic BP Percentile --      Diastolic BP Percentile --      Pulse Rate 01/20/23 1858 (!) 107     Resp 01/20/23 1858 18     Temp 01/20/23 1858 98.3 F (36.8 C)     Temp Source 01/20/23 1858 Oral     SpO2 01/20/23 1858 96 %     Weight 01/20/23 1858 122.5 kg (270 lb)     Height 01/20/23 1858 1.727 m (5\' 8" )     Head Circumference --      Peak Flow --      Pain Score 01/20/23 1907 0     Pain Loc --      Pain Education --      Exclude from Growth Chart --     Most recent vital signs: Vitals:   01/20/23 1858 01/20/23 2030  BP: 124/79   Pulse: (!) 107 83  Resp: 18 16  Temp: 98.3 F (36.8 C)   SpO2: 96% 96%     General: Awake, no distress.  No vomiting CV:  Good peripheral perfusion.  Resp:  Normal effort.  Abd:  No distention.  Other:     ED Results / Procedures / Treatments   Labs (all labs ordered are listed, but only abnormal results are displayed) Labs Reviewed  COMPREHENSIVE METABOLIC PANEL - Abnormal; Notable for the following components:      Result Value   Glucose, Bld 111 (*)    All other components within normal limits  SALICYLATE LEVEL - Abnormal; Notable for the following components:   Salicylate Lvl <7.0 (*)    All other components within normal limits  ACETAMINOPHEN  LEVEL - Abnormal; Notable for the following components:   Acetaminophen (Tylenol), Serum <10 (*)    All other components within normal limits  CBC - Abnormal; Notable for the following components:   WBC 11.1 (*)    All other components within normal limits  CBG MONITORING, ED - Abnormal; Notable for the following components:   Glucose-Capillary 110 (*)    All other components within normal limits  ETHANOL  URINE DRUG SCREEN, QUALITATIVE ([facility] ONLY)  LITHIUM LEVEL  LITHIUM LEVEL  BASIC METABOLIC PANEL  POC URINE PREG, ED     EKG  ED ECG REPORT I, Jene Every, the attending physician, personally viewed and interpreted this ECG.  Date: 01/20/2023  Rhythm: normal sinus rhythm QRS Axis: normal Intervals: normal ST/T Wave abnormalities: normal Narrative Interpretation: no evidence of acute ischemia    RADIOLOGY     PROCEDURES:  Critical Care performed: yes  CRITICAL CARE Performed by: Jene Every   Total critical care time: 30 minutes  Critical care time was exclusive of separately billable procedures and treating other patients.  Critical care was necessary to treat or  prevent imminent or life-threatening deterioration.  Critical care was time spent personally by me on the following activities: development of treatment plan with patient and/or surrogate as well as nursing, discussions with consultants, evaluation of patient's response to treatment, examination of patient, obtaining history from patient or surrogate, ordering and performing treatments and interventions, ordering and review of laboratory studies, ordering and review of radiographic studies, pulse oximetry and re-evaluation of patient's condition.   Procedures   MEDICATIONS ORDERED IN ED: Medications  0.9 %  sodium chloride infusion ( Intravenous New Bag/Given 01/20/23 2115)     IMPRESSION / MDM / ASSESSMENT AND PLAN / ED COURSE  I reviewed the triage vital signs and the nursing  notes. Patient's presentation is most consistent with acute presentation with potential threat to life or bodily function.  Patient presents after intentional drug overdose as detailed above.  Large amount of lithium taken concern for possible lithium toxicity, however likely patient is currently asymptomatic and generally well-appearing  Poison control consulted by RN, recommend 4-hour lithium, 4-hour electrolytes, 4-hour EKG, these have been ordered.  Lab work reviewed, initial lithium is reassuring, otherwise lab work unremarkable.  Psych and TTS consulted   The patient has been placed in psychiatric observation due to the need to provide a safe environment for the patient while obtaining psychiatric consultation and evaluation, as well as ongoing medical and medication management to treat the patient's condition.  The patient has been placed under full IVC at this time.      FINAL CLINICAL IMPRESSION(S) / ED DIAGNOSES   Final diagnoses:  Suicidal ideation  Intentional drug overdose, initial encounter [facility])     Rx / DC Orders   ED Discharge Orders     None        Note:  This document was prepared using Dragon voice recognition software and may include unintentional dictation errors.   Jene Every, MD 01/20/23 2206

## 2023-01-20 NOTE — ED Triage Notes (Signed)
Pt reports she overdosed because "in laws like to mess with me and turn my husband against me", in attempts of suicide. States she currently feels dizzy and shaky.   Reports ingestion occurred approx 30 minutes PTA. Pt states she took entire blister back of her medication.  Total intake included  Ariprazole 35mg  Cabergoline 0.5mg  Escitalopram 70mg  Furosemide 140mg  Levothyroxine 525mg  Lithium Carbonate 4200mg  Potassium Cl Topiramate 350mg 

## 2023-01-20 NOTE — BH Assessment (Signed)
Comprehensive Clinical Assessment (CCA) Screening, Triage and Referral Note  01/20/2023 Melanie Bell [id number] Recommendations for Services/Supports/Treatments: Psych consult/disposition pending. Melanie Bell is a 32 year old, English speaking, white female with a history of PTSD, borderline personality disorder, and schizoaffective disorder and suicide attempt by overdose. Pt presented to ED voluntarily. Per triage note: Pt reports she overdosed because "in laws like to mess with me and turn my husband against me", in attempts of suicide.  Per patient report, the pt attempted an overdose due to conflict with his in laws and marital discord. Pt explained that her husband asked her for a divorce and she does not know where she'll live. Pt reported that her family lives in [place] and live in a crowded appointment. Pt identified her stressors as having a failing marriage, and housing insecurity. Pt denied being depressed prior to the incident earlier in the day, explaining that she acted without thinking. Pt reported that she has sleep and appetite disturbance. Pt reported that she is connected to the [facility] Team and that she is medication compliant. Pt admitted to sleep disturbance. Pt had coherent speech and was alert and oriented. Pt's thoughts were relevant and intact. The patient denied current HI or AV/H. Pt denied substance use.  Chief Complaint:  Chief Complaint  Patient presents with   Drug Overdose   Visit Diagnosis: PTSD Intentional Overdose  Patient Reported Information How did you hear about [name]? -- (EMS)  What Is the Reason for Your Visit/Call Today? Patient reports she was overwhelmed and took a "bunch of pills."  How Long Has This Been Causing You Problems? > than 6 months  What Do You Feel Would Help You the Most Today? Treatment for Depression or other mood problem; Stress Management; Medication(s)   Have You Recently Had Any Thoughts About Hurting  Yourself? Yes  Are You Planning to Commit Suicide/Harm Yourself At This time? No   Have you Recently Had Thoughts About Hurting Someone Melanie Bell? No  Are You Planning to Harm Someone at This Time? No  Explanation: No data recorded  Have You Used Any Alcohol or Drugs in the Past 24 Hours? No  How Long Ago Did You Use Drugs or Alcohol? No data recorded What Did You Use and How Much? No data recorded  Do You Currently Have a Therapist/Psychiatrist? Yes  Name of Therapist/Psychiatrist: Dr. Mervyn Skeeters- [facility]   Have You Been Recently Discharged From Any Office Practice or Programs? No  Explanation of Discharge From Practice/Program: No data recorded   CCA Screening Triage Referral Assessment Type of Contact: Face-to-Face  Telemedicine Service Delivery:   Is this Initial or Reassessment?   Date Telepsych consult ordered in [facility]:    Time Telepsych consult ordered in [facility]:    Location of Assessment: [facility] ED  Provider Location: [facility] ED    Collateral Involvement: None provided   Does Patient Have a Court Appointed Legal Guardian? No data recorded Name and Contact of Legal Guardian: No data recorded If Minor and Not Living with Parent(s), Who has Custody? No data recorded Is CPS involved or ever been involved? Never  Is APS involved or ever been involved? Never   Patient Determined To Be At Risk for Harm To Self or Others Based on Review of Patient Reported Information or Presenting Complaint? Yes, for Self-Harm  Method: Plan without intent  Availability of Means: No access or NA  Intent: Vague intent or NA  Notification Required: No need or identified person  Additional Information  for Danger to Others Potential: No data recorded Additional Comments for Danger to Others Potential: No data recorded Are There Guns or Other Weapons in Your Home? No data recorded Types of Guns/Weapons: No data recorded Are These Weapons Safely Secured?                            No data  recorded Who Could Verify You Are Able To Have These Secured: No data recorded Do You Have any Outstanding Charges, Pending Court Dates, Parole/Probation? No data recorded Contacted To Inform of Risk of Harm To Self or Others: No data recorded  Does Patient Present under Involuntary Commitment? Yes    [place] of Residence: [place]   Patient Currently Receiving the Following Services: Medication Management   Determination of Need: Emergent (2 hours)   Options For Referral: ED Visit; Inpatient Hospitalization; Medication Management   Discharge Disposition:     Shavy Beachem R Myracle Febres, LCAS

## 2023-01-20 NOTE — ED Notes (Signed)
CBG 110. 

## 2023-01-20 NOTE — ED Notes (Signed)
Pt taken to the room via wheelchair and attached to the monitor; primary RN made aware of the patients arrival.

## 2023-01-21 DIAGNOSIS — E785 Hyperlipidemia, unspecified: Secondary | ICD-10-CM | POA: Diagnosis present

## 2023-01-21 DIAGNOSIS — T381X2A Poisoning by thyroid hormones and substitutes, intentional self-harm, initial encounter: Secondary | ICD-10-CM | POA: Diagnosis present

## 2023-01-21 DIAGNOSIS — F4310 Post-traumatic stress disorder, unspecified: Secondary | ICD-10-CM | POA: Diagnosis present

## 2023-01-21 DIAGNOSIS — Z765 Malingerer [conscious simulation]: Secondary | ICD-10-CM | POA: Diagnosis not present

## 2023-01-21 DIAGNOSIS — T501X2A Poisoning by loop [high-ceiling] diuretics, intentional self-harm, initial encounter: Secondary | ICD-10-CM | POA: Diagnosis present

## 2023-01-21 DIAGNOSIS — Z560 Unemployment, unspecified: Secondary | ICD-10-CM | POA: Diagnosis not present

## 2023-01-21 DIAGNOSIS — G43009 Migraine without aura, not intractable, without status migrainosus: Secondary | ICD-10-CM | POA: Diagnosis present

## 2023-01-21 DIAGNOSIS — F250 Schizoaffective disorder, bipolar type: Secondary | ICD-10-CM | POA: Diagnosis present

## 2023-01-21 DIAGNOSIS — E039 Hypothyroidism, unspecified: Secondary | ICD-10-CM | POA: Insufficient documentation

## 2023-01-21 DIAGNOSIS — T503X2A Poisoning by electrolytic, caloric and water-balance agents, intentional self-harm, initial encounter: Secondary | ICD-10-CM | POA: Diagnosis present

## 2023-01-21 DIAGNOSIS — T1491XA Suicide attempt, initial encounter: Secondary | ICD-10-CM | POA: Diagnosis not present

## 2023-01-21 DIAGNOSIS — T43222A Poisoning by selective serotonin reuptake inhibitors, intentional self-harm, initial encounter: Secondary | ICD-10-CM | POA: Diagnosis present

## 2023-01-21 DIAGNOSIS — Z8249 Family history of ischemic heart disease and other diseases of the circulatory system: Secondary | ICD-10-CM | POA: Diagnosis not present

## 2023-01-21 DIAGNOSIS — Z803 Family history of malignant neoplasm of breast: Secondary | ICD-10-CM | POA: Diagnosis not present

## 2023-01-21 DIAGNOSIS — T50912A Poisoning by multiple unspecified drugs, medicaments and biological substances, intentional self-harm, initial encounter: Secondary | ICD-10-CM | POA: Diagnosis present

## 2023-01-21 DIAGNOSIS — Z1152 Encounter for screening for COVID-19: Secondary | ICD-10-CM | POA: Diagnosis not present

## 2023-01-21 DIAGNOSIS — F603 Borderline personality disorder: Secondary | ICD-10-CM | POA: Diagnosis present

## 2023-01-21 DIAGNOSIS — Z87891 Personal history of nicotine dependence: Secondary | ICD-10-CM | POA: Diagnosis not present

## 2023-01-21 DIAGNOSIS — J4520 Mild intermittent asthma, uncomplicated: Secondary | ICD-10-CM | POA: Diagnosis present

## 2023-01-21 DIAGNOSIS — Z79890 Hormone replacement therapy (postmenopausal): Secondary | ICD-10-CM | POA: Diagnosis not present

## 2023-01-21 DIAGNOSIS — D72829 Elevated white blood cell count, unspecified: Secondary | ICD-10-CM | POA: Diagnosis present

## 2023-01-21 DIAGNOSIS — Z59819 Housing instability, housed unspecified: Secondary | ICD-10-CM | POA: Diagnosis not present

## 2023-01-21 DIAGNOSIS — I10 Essential (primary) hypertension: Secondary | ICD-10-CM | POA: Diagnosis present

## 2023-01-21 DIAGNOSIS — T43592A Poisoning by other antipsychotics and neuroleptics, intentional self-harm, initial encounter: Secondary | ICD-10-CM | POA: Diagnosis present

## 2023-01-21 DIAGNOSIS — F251 Schizoaffective disorder, depressive type: Secondary | ICD-10-CM | POA: Diagnosis not present

## 2023-01-21 DIAGNOSIS — G4700 Insomnia, unspecified: Secondary | ICD-10-CM | POA: Diagnosis present

## 2023-01-21 DIAGNOSIS — T426X2A Poisoning by other antiepileptic and sedative-hypnotic drugs, intentional self-harm, initial encounter: Secondary | ICD-10-CM | POA: Diagnosis present

## 2023-01-21 LAB — LITHIUM LEVEL
Lithium Lvl: 1.36 mmol/L — ABNORMAL HIGH (ref 0.60–1.20)
Lithium Lvl: 1.31 mmol/L — ABNORMAL HIGH (ref 0.60–1.20)
Lithium Lvl: 0.61 mmol/L (ref 0.60–1.20)

## 2023-01-21 LAB — COMPREHENSIVE METABOLIC PANEL
ALT: 14 U/L (ref 0–44)
AST: 17 U/L (ref 15–41)
Albumin: 3.8 g/dL (ref 3.5–5.0)
Alkaline Phosphatase: 52 U/L (ref 38–126)
Anion gap: 6 (ref 5–15)
BUN: 13 mg/dL (ref 6–20)
CO2: 23 mmol/L (ref 22–32)
Calcium: 9.2 mg/dL (ref 8.9–10.3)
Chloride: 111 mmol/L (ref 98–111)
Creatinine, Ser: 0.71 mg/dL (ref 0.44–1.00)
GFR, Estimated: >60 mL/min (ref >60)
Glucose, Bld: 105 mg/dL — ABNORMAL HIGH (ref 70–99)
Potassium: 4.1 mmol/L (ref 3.5–5.1)
Sodium: 140 mmol/L (ref 135–145)
Total Bilirubin: 0.7 mg/dL (ref 0.3–1.2)
Total Protein: 7.2 g/dL (ref 6.5–8.1)

## 2023-01-21 LAB — BASIC METABOLIC PANEL
Anion gap: 8 (ref 5–15)
BUN: 15 mg/dL (ref 6–20)
CO2: 25 mmol/L (ref 22–32)
Calcium: 9.5 mg/dL (ref 8.9–10.3)
Chloride: 108 mmol/L (ref 98–111)
Creatinine, Ser: 0.79 mg/dL (ref 0.44–1.00)
GFR, Estimated: >60 mL/min (ref >60)
Glucose, Bld: 109 mg/dL — ABNORMAL HIGH (ref 70–99)
Potassium: 3.9 mmol/L (ref 3.5–5.1)
Sodium: 141 mmol/L (ref 135–145)

## 2023-01-21 LAB — CBC
HCT: 41.3 % (ref 36.0–46.0)
Hemoglobin: 13.4 g/dL (ref 12.0–15.0)
MCH: 30.2 pg (ref 26.0–34.0)
MCHC: 32.4 g/dL (ref 30.0–36.0)
MCV: 93.2 fL (ref 80.0–100.0)
Platelets: 203 10*3/uL (ref 150–400)
RBC: 4.43 MIL/uL (ref 3.87–5.11)
RDW: 12.9 % (ref 11.5–15.5)
WBC: 10.5 10*3/uL (ref 4.0–10.5)
nRBC: 0 % (ref 0.0–0.2)

## 2023-01-21 LAB — ACETAMINOPHEN LEVEL: Acetaminophen (Tylenol), Serum: <10 ug/mL — ABNORMAL LOW (ref 10–30)

## 2023-01-21 MED ORDER — ONDANSETRON HCL 4 MG PO TABS
4.0000 mg | ORAL_TABLET | Freq: Four times a day (QID) | ORAL | Status: DC | PRN
  Administered 2023-01-22: 4 mg via ORAL
  Filled 2023-01-21: qty 1

## 2023-01-21 MED ORDER — ENOXAPARIN SODIUM 60 MG/0.6ML IJ SOSY
60.0000 mg | PREFILLED_SYRINGE | INTRAMUSCULAR | Status: DC
  Administered 2023-01-21 – 2023-01-24 (×4): 60 mg via SUBCUTANEOUS
  Filled 2023-01-21 (×4): qty 0.6

## 2023-01-21 MED ORDER — ALBUTEROL SULFATE (2.5 MG/3ML) 0.083% IN NEBU: 2.5000 mg | INHALATION_SOLUTION | RESPIRATORY_TRACT | Status: DC | PRN

## 2023-01-21 MED ORDER — PANTOPRAZOLE SODIUM 40 MG PO TBEC
40.0000 mg | DELAYED_RELEASE_TABLET | Freq: Every day | ORAL | Status: DC
  Administered 2023-01-21 – 2023-01-24 (×4): 40 mg via ORAL
  Filled 2023-01-21 (×4): qty 1

## 2023-01-21 MED ORDER — VITAMIN B-12 100 MCG PO TABS
100.0000 ug | ORAL_TABLET | Freq: Every day | ORAL | Status: DC
  Administered 2023-01-21 – 2023-01-24 (×4): 100 ug via ORAL
  Filled 2023-01-21 (×4): qty 1

## 2023-01-21 MED ORDER — ALBUTEROL SULFATE HFA 108 (90 BASE) MCG/ACT IN AERS: 2.0000 | INHALATION_SPRAY | Freq: Four times a day (QID) | RESPIRATORY_TRACT | Status: DC | PRN

## 2023-01-21 MED ORDER — SUMATRIPTAN SUCCINATE 50 MG PO TABS
25.0000 mg | ORAL_TABLET | ORAL | Status: DC | PRN
  Administered 2023-01-21 – 2023-01-22 (×2): 25 mg via ORAL
  Filled 2023-01-21 (×4): qty 1

## 2023-01-21 MED ORDER — ALBUTEROL SULFATE HFA 108 (90 BASE) MCG/ACT IN AERS: 2.0000 | INHALATION_SPRAY | Freq: Three times a day (TID) | RESPIRATORY_TRACT | Status: DC | PRN

## 2023-01-21 MED ORDER — ONDANSETRON HCL 4 MG/2ML IJ SOLN
4.0000 mg | Freq: Four times a day (QID) | INTRAMUSCULAR | Status: DC | PRN
  Administered 2023-01-21: 4 mg via INTRAVENOUS
  Filled 2023-01-21: qty 2

## 2023-01-21 MED ORDER — FUROSEMIDE 20 MG PO TABS
20.0000 mg | ORAL_TABLET | Freq: Every day | ORAL | Status: DC
  Administered 2023-01-21 – 2023-01-24 (×4): 20 mg via ORAL
  Filled 2023-01-21 (×4): qty 1

## 2023-01-21 MED ORDER — SUMATRIPTAN 20 MG/ACT NA SOLN
20.0000 mg | NASAL | Status: DC | PRN
  Filled 2023-01-21: qty 1

## 2023-01-21 MED ORDER — SODIUM CHLORIDE 0.9 % IV SOLN: INTRAVENOUS | Status: DC

## 2023-01-21 MED ORDER — ACETAMINOPHEN 650 MG RE SUPP: 650.0000 mg | Freq: Four times a day (QID) | RECTAL | Status: DC | PRN

## 2023-01-21 MED ORDER — SODIUM CHLORIDE 0.9 % IV BOLUS
1000.0000 mL | Freq: Once | INTRAVENOUS | Status: AC
  Administered 2023-01-21: 1000 mL via INTRAVENOUS

## 2023-01-21 MED ORDER — MAGNESIUM HYDROXIDE 400 MG/5ML PO SUSP
30.0000 mL | Freq: Every day | ORAL | Status: DC | PRN
  Administered 2023-01-24: 30 mL via ORAL
  Filled 2023-01-21: qty 30

## 2023-01-21 MED ORDER — ACETAMINOPHEN 325 MG PO TABS: 650.0000 mg | ORAL_TABLET | Freq: Four times a day (QID) | ORAL | Status: DC | PRN

## 2023-01-21 NOTE — Plan of Care (Signed)

## 2023-01-21 NOTE — ED Notes (Signed)
Pt given crackers and eating banana at this time

## 2023-01-21 NOTE — ED Notes (Signed)
Pt in shower. Breakfast at bedside.

## 2023-01-21 NOTE — Progress Notes (Signed)
RN discussed plan with patient to help her get emotional support. Per plan Chaplain consult was placed. Charge notified.

## 2023-01-21 NOTE — ED Notes (Signed)
Ivc /psych consult complete / recommended  for inpatient psychiatric treatment  for stabilzation

## 2023-01-21 NOTE — ED Provider Notes (Signed)
-----------------------------------------   2:46 AM on 01/21/2023 -----------------------------------------   Repeat EKG and BMP unremarkable.  Lithium now trending upwards.  Will start supportive treatment with IV fluids and consult hospitalist services for evaluation and admission.  ED ECG REPORT I, Ziad Maye J, the attending physician, personally viewed and interpreted this ECG.   Date: 01/21/2023  EKG Time: 0011  Rate: 82  Rhythm: normal sinus rhythm  Axis: Normal  Intervals:none  ST&T Change: Nonspecific    Irean Hong, MD 01/21/23 [phone]

## 2023-01-21 NOTE — Plan of Care (Signed)

## 2023-01-21 NOTE — ED Notes (Signed)
Pt taking shower. Pt was given hygiene items and the following, 1 clean top, 1 clean bottom, with 1 pair of disposable underwear.  Pt changed out into clean clothing.  Staff disposed of all shower supplies.   

## 2023-01-21 NOTE — Assessment & Plan Note (Signed)
--  Psychiatry consulted on admission - recommendation is inpatient psych admission once medically clear --Continue suicide precautions with sitter --Daily contact by psychiatry team for ongoing assessment --Resumption of psych meds per psychiatry ----Abilify resumed 9/8 ----Holding lithium, Lexapro

## 2023-01-21 NOTE — ED Notes (Signed)
Spoke with poisen control regarding lithium and tylenol level.

## 2023-01-21 NOTE — Assessment & Plan Note (Signed)
-   We will continue Synthroid. 

## 2023-01-21 NOTE — Assessment & Plan Note (Signed)
-   Psychiatry is following - defer to their recommendations for medications --Resumed on Abilify 9/8 --Lithium & Lexapro on hold

## 2023-01-21 NOTE — Assessment & Plan Note (Signed)
-   Continue albuterol PRN ?

## 2023-01-21 NOTE — Progress Notes (Signed)
Brief rounding note, same day as admission  HPI: Pt admitted earlier this AM after intentional drug overdose / suicide attempt. See H&P and psychiatry consult notes for full HPI on admission.  Interval history: Pt seen in the ED, holding for a bed.  States she is feeling a bit better today.  Has a headache and intermittent nausea.  Denies active SI at this time.  She does become tearful discussing issues at home, husband wanting a divorce, needing to get her belongings from their home and not knowing where she will stay.  Under a lot of stress currently.  She states they rent their home and she is on the lease, therefore has legal right to be in the home.  Exam: General exam: awake, alert, no acute distress HEENT: atraumatic, clear conjunctiva, anicteric sclera, moist mucus membranes, hearing grossly normal  Respiratory system: CTAB, no wheezes, rales or rhonchi, normal respiratory effort. Cardiovascular system: normal S1/S2, RRR, no JVD, murmurs, rubs, gallops, no pedal edema.   Gastrointestinal system: soft, NT, ND, no HSM felt, +bowel sounds. Central nervous system: A&O x 4. no gross focal neurologic deficits, normal speech Extremities: moves all , no edema, normal tone Skin: dry, intact, normal temperature Psychiatry: depressed mood, congruent affect, judgement and insight appear normal, no SI or HI, no hallucinations    A&P: as per H&P by Dr. Arville Care and as per Psychiatry, with any changes or additions as below:  --repeat lithium level pending for 1 pm   (Lithium level trend 0.86 normal >> 1.36 >> 1.31 on AM labs) --continue IV fluids --resume home Imitrex for headache       No charge

## 2023-01-21 NOTE — Progress Notes (Signed)
PHARMACIST - PHYSICIAN COMMUNICATION  CONCERNING:  Enoxaparin (Lovenox) for DVT Prophylaxis    RECOMMENDATION: Patient was prescribed enoxaprin 40mg  q24 hours for VTE prophylaxis.   Filed Weights   01/20/23 1858  Weight: 122.5 kg (270 lb)    Body mass index is 41.05 kg/m.  Estimated Creatinine Clearance: 137.8 mL/min (by C-G formula based on SCr of 0.79 mg/dL).   Based on [facility] policy patient is candidate for enoxaparin 0.5mg /kg TBW SQ every 24 hours based on BMI being >30.  DESCRIPTION: Pharmacy has adjusted enoxaparin dose per [facility] policy.  Patient is now receiving enoxaparin 0.5 mg/kg every 24 hours   Otelia Sergeant, PharmD, [facility] 01/21/2023 4:02 AM

## 2023-01-21 NOTE — H&P (Signed)
Melanie   Bell [name]    MR#:  [id number]  DATE OF BIRTH:  23-Apr-1991  DATE OF ADMISSION:  01/20/2023  PRIMARY CARE PHYSICIAN: [name], MD   Bell is coming from: Home  REQUESTING/REFERRING PHYSICIAN: Chiquita Loth, MD  CHIEF COMPLAINT:   Chief Complaint  Bell presents with   Drug Overdose    HISTORY OF PRESENT ILLNESS:  Melanie Bell is a 32 y.o. Melanie Bell with medical history significant for asthma, depression, osteoarthritis, anxiety, schizoaffective bipolar disorder, who presented to the emergency room with acute onset of suicidal/intentional multiple drug overdose including Abilify, Dostinex, Lexapro, Lasix, Synthroid, lithium carbonate, potassium chloride and Topamax.  She took 350 mg of p.o. Topamax 70 mg of potassium chloride and 4200 mg of lithium carbonate as well as 525 mcg of levothyroxine 140 mg of I p.o. Lasix and 70 mg of p.o. to Lexapro and 0.5 mg of p.o. Dostinex with 35 mg of p.o. Abilify.  Initially Melanie Bell was unremarkable and later on elevated.  Possible control recommended supportive treatment with IV fluids and monitor BMP and renal functions as well as EKG.  [name] has been having mild nausea and vomiting yesterday without diarrhea.  She stated that she has been overwhelmed and under a lot of stress lately.  She has been feeling thirsty.  No fever or chills.  No chest pain or palpitations.  No cough or wheezing or dyspnea.  No dysuria, oliguria or hematuria or flank pain.  ED Course: Upon presentation to the emergency room BP was 125/54 with otherwise normal vital signs and later respiratory rate was 24.  Labs revealed unremarkable CMP.  CBC showed leukocytosis of 11.1.  Tylenol level was less than 10 and salicylate less than 7.  Lithium level was initially 0.86 and later on 1.36 and 1.31.  Urine pregnancy test was negative.  Urine drug screen was negative. EKG as reviewed by me : Initial EKG showed sinus tachycardia  with rate 102 with Q waves inferiorly Imaging: None.  [name] was given 1 L bolus of IV normal saline.  She will be admitted to a stepdown unit bed for close monitoring and observation for further evaluation and management. PAST MEDICAL HISTORY:   Past Medical History:  Diagnosis Date   Allergy    Anxiety    Arthritis    Asthma    Depression    Foreign body aspiration    GERD (gastroesophageal reflux disease)    Hallucinations 09/30/2014   Sizoaffective   Hyperlipidemia    Hypertension    Hypoglycemia    Intentional ingestion of batteries 04/21/2016    2 AAA batteries and 1 thumb tack; intent to hurt herself/notes 04/21/2016   Seizure (HCC) 01/16/2023   Self-inflicted injury 11/23/2017   Suicidal ideation 01/02/2018   Tardive dyskinesia 10/2014   recent onset   Thyroid disease     PAST SURGICAL HISTORY:   Past Surgical History:  Procedure Laterality Date   ABDOMINAL SURGERY     "years ago" to remove foreign objects   APPENDECTOMY     BREAST EXCISIONAL BIOPSY Right 09/03/2007   surgical bx removed fibroadeoma   BREAST LUMPECTOMY Right    CHOLECYSTECTOMY  02/2022   COLONOSCOPY WITH PROPOFOL N/A 09/10/2015   Procedure: COLONOSCOPY WITH PROPOFOL;  Surgeon: Christena Deem, MD;  Location: [facility] ENDOSCOPY;  Service: Endoscopy;  Laterality: N/A;   ESOPHAGOGASTRODUODENOSCOPY N/A 11/28/2014   Procedure: ESOPHAGOGASTRODUODENOSCOPY (EGD);  Surgeon: Scot Jun, MD;  Location: [facility] ENDOSCOPY;  Service: Endoscopy;  Laterality: N/A;   ESOPHAGOGASTRODUODENOSCOPY N/A 02/21/2016   Procedure: ESOPHAGOGASTRODUODENOSCOPY (EGD);  Surgeon: Napoleon Form, MD;  Location: [facility] ENDOSCOPY;  Service: Endoscopy;  Laterality: N/A;   ESOPHAGOGASTRODUODENOSCOPY N/A 04/21/2016   Procedure: ESOPHAGOGASTRODUODENOSCOPY (EGD);  Surgeon: Iva Boop, MD;  Location: [facility] ENDOSCOPY;  Service: Endoscopy;  Laterality: N/A;   ESOPHAGOGASTRODUODENOSCOPY N/A 05/06/2016   Procedure:  ESOPHAGOGASTRODUODENOSCOPY (EGD);  Surgeon: Wyline Mood, MD;  Location: [facility] ENDOSCOPY;  Service: Gastroenterology;  Laterality: N/A;   ESOPHAGOGASTRODUODENOSCOPY N/A 06/13/2016   Procedure: ESOPHAGOGASTRODUODENOSCOPY (EGD);  Surgeon: Midge Minium, MD;  Location: [facility] ENDOSCOPY;  Service: Endoscopy;  Laterality: N/A;   ESOPHAGOGASTRODUODENOSCOPY N/A 01/14/2022   Procedure: ESOPHAGOGASTRODUODENOSCOPY (EGD);  Surgeon: Toledo, Boykin Nearing, MD;  Location: [facility] ENDOSCOPY;  Service: Gastroenterology;  Laterality: N/A;   ESOPHAGOGASTRODUODENOSCOPY (EGD) WITH PROPOFOL N/A 02/29/2016   Procedure: ESOPHAGOGASTRODUODENOSCOPY (EGD) WITH PROPOFOL;  Surgeon: Midge Minium, MD;  Location: [facility] ENDOSCOPY;  Service: Endoscopy;  Laterality: N/A;   ESOPHAGOGASTRODUODENOSCOPY (EGD) WITH PROPOFOL N/A 01/15/2018   Procedure: ESOPHAGOGASTRODUODENOSCOPY (EGD) WITH PROPOFOL;  Surgeon: Corbin Ade, MD;  Location: [facility] ENDO SUITE;  Service: Endoscopy;  Laterality: N/A;  retrieval forein body   LAPAROSCOPIC LYSIS OF ADHESIONS  02/16/2022   Procedure: LAPAROSCOPIC LYSIS OF ADHESIONS;  Surgeon: Henrene Dodge, MD;  Location: [facility] ORS;  Service: General;;   LAPAROTOMY N/A 09/12/2015   Procedure: EXPLORATORY LAPAROTOMY;  Surgeon: Lattie Haw, MD;  Location: [facility] ORS;  Service: General;  Laterality: N/A;   SIGMOIDOSCOPY N/A 09/12/2015   Procedure: Daiva Huge;  Surgeon: Lattie Haw, MD;  Location: [facility] ORS;  Service: General;  Laterality: N/A;   WISDOM TOOTH EXTRACTION      SOCIAL HISTORY:   Social History   Tobacco Use   Smoking status: Former    Current packs/day: 0.00    Average packs/day: 0.5 packs/day for 3.0 years (1.5 ttl pk-yrs)    Types: Cigarettes    Start date: 08/14/2013    Quit date: 08/14/2016    Years since quitting: 6.4    Passive exposure: Past   Smokeless tobacco: Never  Substance Use Topics   Alcohol use: No    FAMILY HISTORY:   Family History  Problem Relation Age of Onset   Depression  Mother    Hypertension Mother    Sleep apnea Mother    Asthma Mother    COPD Mother    Diabetes Mother    Anxiety disorder Mother    Arthritis Mother    Heart failure Mother    Breast cancer Maternal Aunt        20's   Stroke Maternal Grandmother    Heart attack Maternal Grandfather     DRUG ALLERGIES:   Allergies  Allergen Reactions   Betadine [Povidone Iodine] Hives   Fish Allergy Hives   Shellfish-Derived Products Hives    REVIEW OF SYSTEMS:   ROS As per history of present illness. All pertinent systems were reviewed above. Constitutional, HEENT, cardiovascular, respiratory, GI, GU, musculoskeletal, neuro, psychiatric, endocrine, integumentary and hematologic systems were reviewed and are otherwise negative/unremarkable except for positive findings mentioned above in the HPI.   MEDICATIONS AT HOME:   Prior to Admission medications   Medication Sig Start Date End Date Taking? Authorizing Provider  albuterol (VENTOLIN HFA) 108 (90 Base) MCG/ACT inhaler 2 puffs four times a day as needed Bell taking differently: Inhale 2 puffs into the lungs every 8 (eight) hours as needed for wheezing or shortness of breath. 12/14/22  Yes Brimage, Vondra, DO  ARIPiprazole (ABILIFY) 5 MG tablet Take 1 tablet (5 mg total) by mouth daily. 11/14/22  Yes [name], DO  cabergoline (DOSTINEX) 0.5 MG tablet Take 1 tablet (0.5 mg total) by mouth once a week. 11/14/22  Yes [name], DO  Cyanocobalamin (VITAMIN B-12 PO) Take 1 capsule by mouth daily.   Yes [provider]  escitalopram (LEXAPRO) 10 MG tablet Take 1 tablet (10 mg total) by mouth daily. 11/14/22  Yes [name], DO  furosemide (LASIX) 20 MG tablet Take 1 tablet (20 mg total) by mouth daily. 11/15/22  Yes [name], DO  levothyroxine (SYNTHROID) 75 MCG tablet Take 1 tablet (75 mcg total) by mouth daily before breakfast. 11/14/22  Yes [name], DO  lithium carbonate  300 MG capsule Take 1 capsule (300 mg total) by mouth 2 (two) times daily with a meal. 11/14/22  Yes [name], DO  Magnesium 500 MG CAPS Take 500 mg by mouth at bedtime.   Yes [provider]  omeprazole (PRILOSEC) 20 MG capsule TAKE 1 CAPSULE (20 MG TOTAL) BY MOUTH DAILY. 06/07/22  Yes [name], MD  ondansetron (ZOFRAN-ODT) 4 MG disintegrating tablet Take 1 tablet (4 mg total) by mouth every 8 (eight) hours as needed. Bell taking differently: Take 4 mg by mouth as needed for nausea or vomiting. 12/14/22  Yes Brimage, Vondra, DO  potassium chloride SA (KLOR-CON M) 10 MEQ tablet Take 1 tablet (10 mEq total) by mouth daily. 11/15/22  Yes [name], DO  SUMAtriptan [facility]) 20 MG/ACT nasal spray Place 20 mg into the nose as needed for migraine or headache. 12/06/21  Yes [provider]  traZODone (DESYREL) 50 MG tablet Take 1 tablet (50 mg total) by mouth at bedtime as needed for sleep. 11/14/22  Yes [name], DO      VITAL SIGNS:  Blood pressure (!) 129/52, pulse 85, temperature 98.3 F (36.8 C), temperature source Oral, resp. rate 14, height 5\' 8"  (1.727 m), weight 122.5 kg, last menstrual period 01/13/2023, SpO2 99%, unknown if currently breastfeeding.  PHYSICAL EXAMINATION:  Physical Exam  GENERAL:  32 y.o.-year-old Melanie Bell lying in the bed with no acute distress.  EYES: Pupils equal, round, reactive to light and accommodation. No scleral icterus. Extraocular muscles intact.  HEENT: Head atraumatic, normocephalic. Oropharynx and nasopharynx clear.  NECK:  Supple, no jugular venous distention. No thyroid enlargement, no tenderness.  LUNGS: Normal breath sounds bilaterally, no wheezing, rales,rhonchi or crepitation. No use of accessory muscles of respiration.  CARDIOVASCULAR: Regular rate and rhythm, S1, S2 normal. No murmurs, rubs, or gallops.  ABDOMEN: Soft, nondistended, nontender. Bowel sounds present. No  organomegaly or mass.  EXTREMITIES: No pedal edema, cyanosis, or clubbing.  NEUROLOGIC: Cranial nerves II through XII are intact. Muscle strength 5/5 in all extremities. Sensation intact. Gait not checked.  PSYCHIATRIC: [name] is alert and oriented x 3.  Normal affect and good eye contact. SKIN: No obvious rash, lesion, or ulcer.   LABORATORY PANEL:   CBC Recent Labs  Lab 01/21/23 0452  WBC 10.5  HGB 13.4  HCT 41.3  PLT 203   ------------------------------------------------------------------------------------------------------------------  Chemistries  Recent Labs  Lab 01/16/23 0939 01/20/23 1910 01/21/23 0452  NA 141   < > 140  K 4.1   < > 4.1  CL 102   < > 111  CO2 23   < > 23  GLUCOSE 95   < >  105*  BUN 8   < > 13  CREATININE 0.71   < > 0.71  CALCIUM 9.5   < > 9.2  MG 1.8  --   --   AST 16   < > 17  ALT 13   < > 14  ALKPHOS 60   < > 52  BILITOT 0.5   < > 0.7   < > = values in this interval not displayed.   ------------------------------------------------------------------------------------------------------------------  Cardiac Enzymes No results for input(s): "TROPONINI" in the last 168 hours. ------------------------------------------------------------------------------------------------------------------  RADIOLOGY:  No results found.    IMPRESSION AND PLAN:  Assessment and Plan: * Drug overdose, multiple drugs, intentional self-harm, initial encounter [facility]) - [name] was admitted to a progressive unit bed. - Will monitor lithium level that is fortunately coming down. - We will continue hydration with IV normal saline. - We will hold off on hold psychotropic medications. - Psychiatry consult will be obtained. - I notified Dr. Daphine Deutscher about [name]  Mild intermittent asthma - We will continue her as needed albuterol.  Hypothyroidism - We will continue Synthroid.  Schizoaffective disorder, bipolar type [facility]) - Psychiatry consult  will be obtained as mentioned above.       DVT prophylaxis: Lovenox.  Advanced Care Planning:  Code Status: full code.  Family Communication:  The plan of care was discussed in details with [name] (and family). I answered all questions. [name] agreed to proceed with the above mentioned plan. Further management will depend upon hospital course. Disposition Plan: Back to previous home environment Consults called: Psychiatry  All the records are reviewed and case discussed with ED provider.  Status is: Inpatient    At the time of the admission, it appears that the appropriate admission status for this Bell is inpatient.  This is judged to be reasonable and necessary in order to provide the required intensity of service to ensure [name]'s safety given the presenting symptoms, physical exam findings and initial radiographic and laboratory data in the context of comorbid conditions.  [name] requires inpatient status due to high intensity of service, high risk of further deterioration and high frequency of surveillance required.  I certify that at the time of admission, it is my clinical judgment that [name] will require inpatient hospital care extending more than 2 midnights.                            Dispo: [name] is from: Home              Anticipated d/c is to: Home              Bell currently is not medically stable to d/c.              Difficult to place Bell: No  Hannah Beat M.D on 01/21/2023 at 7:01 AM  [facility]   From 7 PM-7 AM, contact night-coverage [email or web address]  CC: Primary care physician; [name], MD

## 2023-01-21 NOTE — Consult Note (Cosign Needed Addendum)
Telepsych Consultation   Reason for Consult:  intentional overdose Referring Physician: Jene Every   Location of Patient: [facility] Location of Provider: Other: [facility]  Patient Identification: Sabela Pflieger MRN:  [id number] Principal Diagnosis: Drug overdose, multiple drugs, intentional self-harm, initial encounter [facility]) Diagnosis:  Principal Problem:   Drug overdose, multiple drugs, intentional self-harm, initial encounter (HCC)   Total Time spent with patient: 30 minutes  Subjective:   Orlee Mallicoat is a 32 y.o. female patient with history of PTSD, borderline personality disorder, and schizoaffective disorder and suicide attempt by overdose.The patient reported an overdose attempt, stating that it was due to ongoing conflict with her in-laws and marital issues. She described her distress stemming from her husband's request for a divorce and her uncertainty about where she would live. The patient reports she is concerned about housing insecurity as she has no where to go if her husband files for divorce. She identified the primary stressors as a failing marriage and housing instability. The patient denied feeling depressed before the overdose incident, explaining that she acted impulsively without considering the consequences. Additionally, she reported disturbances in sleep and appetite. She is currently connected with the [facility] ACT Team and reports compliance with her medication regimen.  The patient appeared alert and oriented X4, with coherent speech. Her thought processes were relevant and intact. There were no signs of hallucinations or delusions, and she denied any current suicidal ideation however, she says she is unsure about maintaining her safety if husband continues to request for a divorce. She denies homicidal ideation, auditory/visual hallucinations, paranoia, and substance use. No objective evidence of mania, psychosis, or delusional thought content noted     Past Psychiatric History:  PTSD, borderline personality disorder, and schizoaffective disorder and suicide attempt by overdose.The patient reported an overdose attempt  Risk to Self:   Risk to Others:   Prior Inpatient Therapy:   Prior Outpatient Therapy:    Past Medical History:  Past Medical History:  Diagnosis Date   Allergy    Anxiety    Arthritis    Asthma    Depression    Foreign body aspiration    GERD (gastroesophageal reflux disease)    Hallucinations 09/30/2014   Sizoaffective   Hyperlipidemia    Hypertension    Hypoglycemia    Intentional ingestion of batteries 04/21/2016    2 AAA batteries and 1 thumb tack; intent to hurt herself/notes 04/21/2016   Seizure (HCC) 01/16/2023   Self-inflicted injury 11/23/2017   Suicidal ideation 01/02/2018   Tardive dyskinesia 10/2014   recent onset   Thyroid disease     Past Surgical History:  Procedure Laterality Date   ABDOMINAL SURGERY     "years ago" to remove foreign objects   APPENDECTOMY     BREAST EXCISIONAL BIOPSY Right 09/03/2007   surgical bx removed fibroadeoma   BREAST LUMPECTOMY Right    CHOLECYSTECTOMY  02/2022   COLONOSCOPY WITH PROPOFOL N/A 09/10/2015   Procedure: COLONOSCOPY WITH PROPOFOL;  Surgeon: Christena Deem, MD;  Location: [facility] ENDOSCOPY;  Service: Endoscopy;  Laterality: N/A;   ESOPHAGOGASTRODUODENOSCOPY N/A 11/28/2014   Procedure: ESOPHAGOGASTRODUODENOSCOPY (EGD);  Surgeon: Scot Jun, MD;  Location: [facility] ENDOSCOPY;  Service: Endoscopy;  Laterality: N/A;   ESOPHAGOGASTRODUODENOSCOPY N/A 02/21/2016   Procedure: ESOPHAGOGASTRODUODENOSCOPY (EGD);  Surgeon: Napoleon Form, MD;  Location: [facility] ENDOSCOPY;  Service: Endoscopy;  Laterality: N/A;   ESOPHAGOGASTRODUODENOSCOPY N/A 04/21/2016   Procedure: ESOPHAGOGASTRODUODENOSCOPY (EGD);  Surgeon: Iva Boop, MD;  Location: [facility] ENDOSCOPY;  Service: Endoscopy;  Laterality: N/A;   ESOPHAGOGASTRODUODENOSCOPY N/A 05/06/2016   Procedure:  ESOPHAGOGASTRODUODENOSCOPY (EGD);  Surgeon: Wyline Mood, MD;  Location: [facility] ENDOSCOPY;  Service: Gastroenterology;  Laterality: N/A;   ESOPHAGOGASTRODUODENOSCOPY N/A 06/13/2016   Procedure: ESOPHAGOGASTRODUODENOSCOPY (EGD);  Surgeon: Midge Minium, MD;  Location: [facility] ENDOSCOPY;  Service: Endoscopy;  Laterality: N/A;   ESOPHAGOGASTRODUODENOSCOPY N/A 01/14/2022   Procedure: ESOPHAGOGASTRODUODENOSCOPY (EGD);  Surgeon: Toledo, Boykin Nearing, MD;  Location: [facility] ENDOSCOPY;  Service: Gastroenterology;  Laterality: N/A;   ESOPHAGOGASTRODUODENOSCOPY (EGD) WITH PROPOFOL N/A 02/29/2016   Procedure: ESOPHAGOGASTRODUODENOSCOPY (EGD) WITH PROPOFOL;  Surgeon: Midge Minium, MD;  Location: [facility] ENDOSCOPY;  Service: Endoscopy;  Laterality: N/A;   ESOPHAGOGASTRODUODENOSCOPY (EGD) WITH PROPOFOL N/A 01/15/2018   Procedure: ESOPHAGOGASTRODUODENOSCOPY (EGD) WITH PROPOFOL;  Surgeon: Corbin Ade, MD;  Location: [facility] ENDO SUITE;  Service: Endoscopy;  Laterality: N/A;  retrieval forein body   LAPAROSCOPIC LYSIS OF ADHESIONS  02/16/2022   Procedure: LAPAROSCOPIC LYSIS OF ADHESIONS;  Surgeon: Henrene Dodge, MD;  Location: [facility] ORS;  Service: General;;   LAPAROTOMY N/A 09/12/2015   Procedure: EXPLORATORY LAPAROTOMY;  Surgeon: Lattie Haw, MD;  Location: [facility] ORS;  Service: General;  Laterality: N/A;   SIGMOIDOSCOPY N/A 09/12/2015   Procedure: Daiva Huge;  Surgeon: Lattie Haw, MD;  Location: [facility] ORS;  Service: General;  Laterality: N/A;   WISDOM TOOTH EXTRACTION     Family History:  Family History  Problem Relation Age of Onset   Depression Mother    Hypertension Mother    Sleep apnea Mother    Asthma Mother    COPD Mother    Diabetes Mother    Anxiety disorder Mother    Arthritis Mother    Heart failure Mother    Breast cancer Maternal Aunt        20's   Stroke Maternal Grandmother    Heart attack Maternal Grandfather    Family Psychiatric  History:  Family History  Problem Relation Age of Onset    Depression Mother    Hypertension Mother    Sleep apnea Mother    Asthma Mother    COPD Mother    Diabetes Mother    Anxiety disorder Mother    Arthritis Mother    Heart failure Mother    Breast cancer Maternal Aunt        20's   Stroke Maternal Grandmother    Heart attack Maternal Grandfather     Social History:  Social History   Substance and Sexual Activity  Alcohol Use No     Social History   Substance and Sexual Activity  Drug Use No    Social History   Socioeconomic History   Marital status: Married    Spouse name: Not on file   Number of children: 0   Years of education: 12   Highest education level: Not on file  Occupational History   Occupation: unemployed  Tobacco Use   Smoking status: Former    Current packs/day: 0.00    Average packs/day: 0.5 packs/day for 3.0 years (1.5 ttl pk-yrs)    Types: Cigarettes    Start date: 08/14/2013    Quit date: 08/14/2016    Years since quitting: 6.4    Passive exposure: Past   Smokeless tobacco: Never  Vaping Use   Vaping status: Never Used  Substance and Sexual Activity   Alcohol use: No   Drug use: No   Sexual activity: Yes    Partners: Male    Birth control/protection: None  Other Topics  Concern   Not on file  Social History Narrative   Right handed    One story home   Drinks occasional caffeine   Social Determinants of Health   Financial Resource Strain: Low Risk  (01/11/2023)   Overall Financial Resource Strain (CARDIA)    Difficulty of Paying Living Expenses: Not very hard  Food Insecurity: Patient Declined (01/16/2023)   Hunger Vital Sign    Worried About Running Out of Food in the Last Year: Patient declined    Ran Out of Food in the Last Year: Patient declined  Transportation Needs: Patient Declined (01/16/2023)   PRAPARE - Administrator, Civil Service (Medical): Patient declined    Lack of Transportation (Non-Medical): Patient declined  Physical Activity: Sufficiently Active (01/11/2023)    Exercise Vital Sign    Days of Exercise per Week: 5 days    Minutes of Exercise per Session: 30 min  Recent Concern: Physical Activity - Insufficiently Active (12/27/2022)   Exercise Vital Sign    Days of Exercise per Week: 7 days    Minutes of Exercise per Session: 20 min  Stress: No Stress Concern Present (01/11/2023)   [employer] of Occupational Health - Occupational Stress Questionnaire    Feeling of Stress : Not at all  Social Connections: Moderately Integrated (01/11/2023)   Social Connection and Isolation Panel [NHANES]    Frequency of Communication with Friends and Family: More than three times a week    Frequency of Social Gatherings with Friends and Family: Once a week    Attends Religious Services: More than 4 times per year    Active Member of [employer] or Organizations: No    Attends Banker Meetings: Never    Marital Status: Married   Additional Social History:    Allergies:   Allergies  Allergen Reactions   Betadine [Povidone Iodine] Hives   Fish Allergy Hives   Shellfish-Derived Products Hives    Labs:  Results for orders placed or performed during the hospital encounter of 01/20/23 (from the past 48 hour(s))  Comprehensive metabolic panel     Status: Abnormal   Collection Time: 01/20/23  7:10 PM  Result Value Ref Range   Sodium 139 135 - 145 mmol/L   Potassium 3.9 3.5 - 5.1 mmol/L   Chloride 102 98 - 111 mmol/L   CO2 25 22 - 32 mmol/L   Glucose, Bld 111 (H) 70 - 99 mg/dL    Comment: Glucose reference range applies only to samples taken after fasting for at least 8 hours.   BUN 18 6 - 20 mg/dL   Creatinine, Ser 6.43 0.44 - 1.00 mg/dL   Calcium 9.8 8.9 - 32.9 mg/dL   Total Protein 8.1 6.5 - 8.1 g/dL   Albumin 4.4 3.5 - 5.0 g/dL   AST 20 15 - 41 U/L   ALT 14 0 - 44 U/L   Alkaline Phosphatase 66 38 - 126 U/L   Total Bilirubin 0.5 0.3 - 1.2 mg/dL   GFR, Estimated >51 >88 mL/min    Comment: (NOTE) Calculated using the CKD-EPI Creatinine  Equation (2021)    Anion gap 12 5 - 15    Comment: Performed at [facility], [address] [place], [place] [address]  Ethanol     Status: None   Collection Time: 01/20/23  7:10 PM  Result Value Ref Range   Alcohol, Ethyl (B) <10 <10 mg/dL    Comment: (NOTE) Lowest detectable limit for serum alcohol is  10 mg/dL.  For medical purposes only. Performed at [facility], [address] [place], [place] [address]   Salicylate level     Status: Abnormal   Collection Time: 01/20/23  7:10 PM  Result Value Ref Range   Salicylate Lvl <7.0 (L) 7.0 - 30.0 mg/dL    Comment: Performed at [facility], [address] [place], [place] [address]  Acetaminophen level     Status: Abnormal   Collection Time: 01/20/23  7:10 PM  Result Value Ref Range   Acetaminophen (Tylenol), Serum <10 (L) 10 - 30 ug/mL    Comment: (NOTE) Therapeutic concentrations vary significantly. A range of 10-30 ug/mL  may be an effective concentration for many patients. However, some  are best treated at concentrations outside of this range. Acetaminophen concentrations >150 ug/mL at 4 hours after ingestion  and >50 ug/mL at 12 hours after ingestion are often associated with  toxic reactions.  Performed at [facility], [address] [place], [place] [address]   cbc     Status: Abnormal   Collection Time: 01/20/23  7:10 PM  Result Value Ref Range   WBC 11.1 (H) 4.0 - 10.5 K/uL   RBC 4.86 3.87 - 5.11 MIL/uL   Hemoglobin 14.8 12.0 - 15.0 g/dL   HCT 91.4 78.2 - 95.6 %   MCV 93.4 80.0 - 100.0 fL   MCH 30.5 26.0 - 34.0 pg   MCHC 32.6 30.0 - 36.0 g/dL   RDW 21.3 08.6 - 57.8 %   Platelets 226 150 - 400 K/uL   nRBC 0.0 0.0 - 0.2 %    Comment: Performed at [facility], [address] [place], [place] [address]  Urine Drug Screen, Qualitative     Status: None   Collection Time: 01/20/23  7:10 PM  Result Value Ref Range   Tricyclic, Ur Screen NONE DETECTED NONE  DETECTED   Amphetamines, Ur Screen NONE DETECTED NONE DETECTED   MDMA (Ecstasy)Ur Screen NONE DETECTED NONE DETECTED   Cocaine Metabolite,Ur [employer] NONE DETECTED NONE DETECTED   Opiate, Ur Screen NONE DETECTED NONE DETECTED   Phencyclidine (PCP) Ur S NONE DETECTED NONE DETECTED   Cannabinoid 50 Ng, Ur [place] NONE DETECTED NONE DETECTED   Barbiturates, Ur Screen NONE DETECTED NONE DETECTED   Benzodiazepine, Ur Scrn NONE DETECTED NONE DETECTED   Methadone Scn, Ur NONE DETECTED NONE DETECTED    Comment: (NOTE) Tricyclics + metabolites, urine    Cutoff 1000 ng/mL Amphetamines + metabolites, urine  Cutoff 1000 ng/mL MDMA (Ecstasy), urine              Cutoff 500 ng/mL Cocaine Metabolite, urine          Cutoff 300 ng/mL Opiate + metabolites, urine        Cutoff 300 ng/mL Phencyclidine (PCP), urine         Cutoff 25 ng/mL Cannabinoid, urine                 Cutoff 50 ng/mL Barbiturates + metabolites, urine  Cutoff 200 ng/mL Benzodiazepine, urine              Cutoff 200 ng/mL Methadone, urine                   Cutoff 300 ng/mL  The urine drug screen provides only a preliminary, unconfirmed analytical test result and should not be used for non-medical purposes. Clinical consideration and professional judgment should be applied to any positive drug screen  result due to possible interfering substances. A more specific alternate chemical method must be used in order to obtain a confirmed analytical result. Gas chromatography / mass spectrometry (GC/MS) is the preferred confirm atory method. Performed at [facility], [address] [place], [place] [address]   Lithium level     Status: None   Collection Time: 01/20/23  7:10 PM  Result Value Ref Range   Lithium Lvl 0.86 0.60 - 1.20 mmol/L    Comment: Performed at [facility], [address] [place], [place] [address]  CBG monitoring, ED     Status: Abnormal   Collection Time: 01/20/23  7:20 PM  Result Value Ref Range    Glucose-Capillary 110 (H) 70 - 99 mg/dL    Comment: Glucose reference range applies only to samples taken after fasting for at least 8 hours.  POC urine preg, ED     Status: None   Collection Time: 01/20/23  7:25 PM  Result Value Ref Range   Preg Test, Ur Negative Negative  Lithium level     Status: Abnormal   Collection Time: 01/21/23 12:08 AM  Result Value Ref Range   Lithium Lvl 1.36 (H) 0.60 - 1.20 mmol/L    Comment: Performed at [facility], [address], [place], [place] [address]  Basic metabolic panel     Status: Abnormal   Collection Time: 01/21/23 12:08 AM  Result Value Ref Range   Sodium 141 135 - 145 mmol/L   Potassium 3.9 3.5 - 5.1 mmol/L   Chloride 108 98 - 111 mmol/L   CO2 25 22 - 32 mmol/L   Glucose, Bld 109 (H) 70 - 99 mg/dL    Comment: Glucose reference range applies only to samples taken after fasting for at least 8 hours.   BUN 15 6 - 20 mg/dL   Creatinine, Ser 2.37 0.44 - 1.00 mg/dL   Calcium 9.5 8.9 - 62.8 mg/dL   GFR, Estimated >31 >51 mL/min    Comment: (NOTE) Calculated using the CKD-EPI Creatinine Equation (2021)    Anion gap 8 5 - 15    Comment: Performed at [facility], [address] [place], [place] [address]    Medications:  No current facility-administered medications for this encounter.   Current Outpatient Medications  Medication Sig Dispense Refill   albuterol (VENTOLIN HFA) 108 (90 Base) MCG/ACT inhaler 2 puffs four times a day as needed (Patient taking differently: Inhale 2 puffs into the lungs every 8 (eight) hours as needed for wheezing or shortness of breath.) 18 g 1   ARIPiprazole (ABILIFY) 5 MG tablet Take 1 tablet (5 mg total) by mouth daily. 30 tablet 3   cabergoline (DOSTINEX) 0.5 MG tablet Take 1 tablet (0.5 mg total) by mouth once a week. 12 tablet 3   Cyanocobalamin (VITAMIN B-12 PO) Take 1 capsule by mouth daily.     escitalopram (LEXAPRO) 10 MG tablet Take 1 tablet (10 mg total) by mouth daily. 30  tablet 3   furosemide (LASIX) 20 MG tablet Take 1 tablet (20 mg total) by mouth daily. 30 tablet 3   levothyroxine (SYNTHROID) 75 MCG tablet Take 1 tablet (75 mcg total) by mouth daily before breakfast. 90 tablet 3   lithium carbonate 300 MG capsule Take 1 capsule (300 mg total) by mouth 2 (two) times daily with a meal. 60 capsule 3   Magnesium 500 MG CAPS Take 500 mg by mouth at bedtime.     omeprazole (PRILOSEC) 20  MG capsule TAKE 1 CAPSULE (20 MG TOTAL) BY MOUTH DAILY. 90 capsule 2   ondansetron (ZOFRAN-ODT) 4 MG disintegrating tablet Take 1 tablet (4 mg total) by mouth every 8 (eight) hours as needed. (Patient taking differently: Take 4 mg by mouth as needed for nausea or vomiting.) 20 tablet 0   potassium chloride SA (KLOR-CON M) 10 MEQ tablet Take 1 tablet (10 mEq total) by mouth daily. 30 tablet 3   SUMAtriptan (IMITREX) 20 MG/ACT nasal spray Place 20 mg into the nose as needed for migraine or headache.     traZODone (DESYREL) 50 MG tablet Take 1 tablet (50 mg total) by mouth at bedtime as needed for sleep. 30 tablet 3    Musculoskeletal: Strength & Muscle Tone: within normal limits Gait & Station: normal Patient leans: Right          Psychiatric Specialty Exam:  Presentation  General Appearance:  Casual  Eye Contact: Good  Speech: Clear and Coherent  Speech Volume: Decreased  Handedness: Right   Mood and Affect  Mood: Anxious  Affect: Congruent   Thought Process  Thought Processes: Coherent  Descriptions of Associations:Intact  Orientation:Full (Time, Place and Person)  Thought Content:WDL  History of Schizophrenia/Schizoaffective disorder:No  Duration of Psychotic Symptoms:No data recorded Hallucinations:Hallucinations: None  Ideas of Reference:None  Suicidal Thoughts:Suicidal Thoughts: Yes, Passive  Homicidal Thoughts:Homicidal Thoughts: No   Sensorium  Memory: Immediate Good  Judgment: Poor  Insight: Fair   Restaurant manager, fast food  Concentration: Fair  Attention Span: Fair  Recall: Fair  Fund of Knowledge: Fair  Language: Fair   Psychomotor Activity  Psychomotor Activity:Psychomotor Activity: Normal   Assets  Assets: Desire for Improvement; Housing; Physical Health; Communication Skills   Sleep  Sleep:Sleep: Fair Number of Hours of Sleep: 6    Physical Exam: Physical Exam Constitutional:      General: She is not in acute distress.    Appearance: She is not ill-appearing.  HENT:     Head: Normocephalic and atraumatic.  Cardiovascular:     Rate and Rhythm: Normal rate.  Pulmonary:     Effort: Pulmonary effort is normal.  Musculoskeletal:     Cervical back: Normal range of motion.     Comments: Unable to assess  Skin:    Comments: Unable to assess  Neurological:     Mental Status: She is alert and oriented to person, place, and time.  Psychiatric:        Attention and Perception: Attention and perception normal.        Mood and Affect: Mood is anxious.        Speech: Speech normal.        Behavior: Behavior normal. Behavior is cooperative.        Thought Content: Thought content is not paranoid or delusional. Thought content includes suicidal ideation. Thought content does not include homicidal ideation. Thought content does not include homicidal or suicidal plan.        Judgment: Judgment is impulsive.    Review of Systems  Constitutional: Negative.   HENT:         Unable to assess  Eyes: Negative.   Respiratory: Negative.    Cardiovascular:        No c/o chest pain  Genitourinary:        No complain  Skin:        Unable to assess  Neurological: Negative.   Psychiatric/Behavioral:  The patient is nervous/anxious.    Blood pressure 124/79, pulse 83, temperature  98.3 F (36.8 C), temperature source Oral, resp. rate 16, height 5\' 8"  (1.727 m), weight 122.5 kg, last menstrual period 01/13/2023, SpO2 96%, unknown if currently breastfeeding. Body mass index is 41.05  kg/m.  Treatment Plan Summary: Daily contact with patient to assess and evaluate symptoms and progress in treatment  Disposition:  Patient is recommended for inpatient psychiatric admission upon medical clearance.   This service was provided via telemedicine using a 2-way, interactive audio and video technology.  Names of all persons participating in this telemedicine service and their role in this encounter. Name: Miylah Kasza Role: patient  Name: Rosalita Chessman,  Role: LCAS  Name: Cecilio Asper Role: NP   Maricela Bo, NP 01/21/2023 3:41 AM

## 2023-01-21 NOTE — Progress Notes (Signed)
Report called to Victorino Dike, RN on 1A.

## 2023-01-22 DIAGNOSIS — G4700 Insomnia, unspecified: Secondary | ICD-10-CM | POA: Diagnosis present

## 2023-01-22 DIAGNOSIS — T50912A Poisoning by multiple unspecified drugs, medicaments and biological substances, intentional self-harm, initial encounter: Secondary | ICD-10-CM

## 2023-01-22 DIAGNOSIS — T1491XA Suicide attempt, initial encounter: Secondary | ICD-10-CM

## 2023-01-22 LAB — COMPREHENSIVE METABOLIC PANEL
ALT: 12 U/L (ref 0–44)
AST: 16 U/L (ref 15–41)
Albumin: 3.6 g/dL (ref 3.5–5.0)
Alkaline Phosphatase: 51 U/L (ref 38–126)
Anion gap: 6 (ref 5–15)
BUN: 13 mg/dL (ref 6–20)
CO2: 24 mmol/L (ref 22–32)
Calcium: 9.2 mg/dL (ref 8.9–10.3)
Chloride: 107 mmol/L (ref 98–111)
Creatinine, Ser: 0.81 mg/dL (ref 0.44–1.00)
GFR, Estimated: >60 mL/min (ref >60)
Glucose, Bld: 95 mg/dL (ref 70–99)
Potassium: 3.8 mmol/L (ref 3.5–5.1)
Sodium: 137 mmol/L (ref 135–145)
Total Bilirubin: 0.9 mg/dL (ref 0.3–1.2)
Total Protein: 6.7 g/dL (ref 6.5–8.1)

## 2023-01-22 MED ORDER — ARIPIPRAZOLE 2 MG PO TABS
5.0000 mg | ORAL_TABLET | Freq: Every day | ORAL | Status: DC
  Administered 2023-01-22 – 2023-01-24 (×3): 5 mg via ORAL
  Filled 2023-01-22 (×3): qty 3

## 2023-01-22 MED ORDER — TRAZODONE HCL 50 MG PO TABS: 50.0000 mg | ORAL_TABLET | Freq: Every evening | ORAL | Status: DC | PRN

## 2023-01-22 MED ORDER — POLYETHYLENE GLYCOL 3350 17 G PO PACK
17.0000 g | PACK | Freq: Every day | ORAL | Status: DC
  Administered 2023-01-22 – 2023-01-23 (×2): 17 g via ORAL
  Filled 2023-01-22 (×2): qty 1

## 2023-01-22 MED ORDER — PROCHLORPERAZINE EDISYLATE 10 MG/2ML IJ SOLN
10.0000 mg | Freq: Four times a day (QID) | INTRAMUSCULAR | Status: DC | PRN
  Administered 2023-01-22 – 2023-01-24 (×3): 10 mg via INTRAVENOUS
  Filled 2023-01-22 (×4): qty 2

## 2023-01-22 NOTE — Progress Notes (Signed)
Progress Note   Patient: Melanie Bell ZOX:[id number] DOB: 1990/07/11 DOA: 01/20/2023     1 DOS: the patient was seen and examined on 01/22/2023   Brief hospital course: HPI on admission 01/21/23 AM: "Melanie Bell is a 32 y.o. female with medical history significant for asthma, depression, osteoarthritis, anxiety, schizoaffective bipolar disorder, who presented to the emergency room with acute onset of suicidal/intentional multiple drug overdose including Abilify, Dostinex, Lexapro, Lasix, Synthroid, lithium carbonate, potassium chloride and Topamax.  She took 350 mg of p.o. Topamax 70 mg of potassium chloride and 4200 mg of lithium carbonate as well as 525 mcg of levothyroxine 140 mg of I p.o. Lasix and 70 mg of p.o. to Lexapro and 0.5 mg of p.o. Dostinex with 35 mg of p.o. Abilify.   Initially female was unremarkable and later on elevated.  Possible control recommended supportive treatment with IV fluids and monitor BMP and renal functions as well as EKG.  The patient has been having mild nausea and vomiting yesterday without diarrhea.  She stated that she has been overwhelmed and under a lot of stress lately.  She has been feeling thirsty. ..."  ED course -- stable vitals other than RR 24.   Labs - CMP unremarkable, CBC with WBC 11.1. Normal tylenol and salicylate levels Initial lithium level normal, repeat elevated. EKG sinus tachycardia HR 102. Poison control was contacted, recommended admission for telemetry monitoring and IV fluids, monitor lithium levels.  Patient was evaluated by psychiatry and recommended for inpatient psych admission once medically clear.  01/22/23 - lithium level normalized.  Pt vomiting this AM after breakfast.    Assessment and Plan: * Drug overdose, multiple drugs, intentional self-harm, initial encounter [facility]) --Psychiatry consulted on admission - recommendation is inpatient psych admission once medically clear --Continue suicide precautions with  sitter --Daily contact by psychiatry team for ongoing assessment --Resumption of psych meds per psychiatry ----Abilify resumed 9/8 ----Holding lithium, Lexapro  Insomnia Resume home trazodone PRN  Mild intermittent asthma Continue albuterol PRN  Hypothyroidism Continue Synthroid  Schizoaffective disorder, bipolar type [facility]) - Psychiatry is following - defer to their recommendations for medications --Resumed on Abilify 9/8 --Lithium & Lexapro on hold  Migraine without aura and without status migrainosus, not intractable Continue Imitrex PRN        Subjective: Pt awake sitting up in bed this AM, sitter at bedside.  Pt had nausea and vomiting after eating breakfast this AM.  Feels better now but required a 2nd antiemetic.  She states ginger ale and crackers were okay, but N/V after attempting to eat more food for breakfast today.   Physical Exam: Vitals:   01/21/23 1521 01/21/23 1543 01/21/23 1758 01/22/23 0804  BP: 124/76 128/67 128/79 138/85  Pulse: 69 74 62 66  Resp: 15 16 14 18   Temp: 98.3 F (36.8 C) 98.6 F (37 C) 98.2 F (36.8 C) 97.9 F (36.6 C)  TempSrc:      SpO2: 94% 100% 100% 100%  Weight:      Height:       General exam: awake, alert, no acute distress HEENT: moist mucus membranes, hearing grossly normal  Respiratory system: on room air, normal respiratory effort. Cardiovascular system: RRR, no pedal edema.   Gastrointestinal system: soft, NT, ND Central nervous system: A&O x4. no gross focal neurologic deficits, normal speech Extremities: moves all, no edema, normal tone Skin: dry, intact, normal temperature Psychiatry: normal mood, congruent affect, judgement and insight appear normal   Data Reviewed:  Notable  labs ---  Lithium level normalized . Normal CMP  Family Communication: None  Disposition: Status is: Inpatient Remains inpatient appropriate because:  Awaiting psychiatric admission once medically clear, but had vomiting this AM.    Anticipate medically clear within next 24 hours.   Psychiatry [facility] is aware of need for placement per Psych NP    Planned Discharge Destination:  Inpatient behavioral / psych admission    Time spent: 38 minutes  Author: Pennie Banter, DO 01/22/2023 1:54 PM  For on call review [email or web address].

## 2023-01-22 NOTE — Assessment & Plan Note (Signed)
Resume home trazodone PRN

## 2023-01-22 NOTE — Progress Notes (Signed)
Pt was asleep at this time when chaplain entered. Sitter at bedside informed this chaplain that she was resting and to not wake patient. Chaplain will attempt a 3rd follow up this evening.

## 2023-01-22 NOTE — Plan of Care (Signed)

## 2023-01-22 NOTE — Progress Notes (Signed)
Per previous chaplain- chaplain Efraim Kaufmann has spoken with staff and caught this chaplain up on plan of care for pt. Chaplain [name] attempted visit but pt was sleeping. This chaplain on call will visit today 9/8 when she is awake with pt to follow up per consult.

## 2023-01-22 NOTE — Consult Note (Signed)
[facility] Face-to-Face Psychiatry Consult   Reason for Consult:  Medication Management Referring Physician:  Jene Every MD Patient Identification: Melanie Bell MRN:  [id number] Principal Diagnosis: Drug overdose, multiple drugs, intentional self-harm, initial encounter [facility]) Diagnosis:  Principal Problem:   Drug overdose, multiple drugs, intentional self-harm, initial encounter [facility]) Active Problems:   Schizoaffective disorder, bipolar type (HCC)   Hypothyroidism   Mild intermittent asthma   Total Time spent with patient: 30 minutes  Subjective:   Melanie Bell is a 32 y.o. female patient seen for psychiatric evaluation for medication management. "I overdosed on pills".  HPI:  Melanie Bell is a 32 y.o. female patient seen for psychiatric evaulation for medication management. Patient reports a previous medication regimen consisting of Abilify, Lithium, and Lexapro. Patient denies any adverse medication effects prior to this hospitalization. She reports being forgetful and missing doses at times prior to hospitalization. Patient is agreeable to restart medications today. She reports a history of AVH seeing demonic figures and hearing voices that tell her she is no good. Patient denies experiencing AVH "not for a while" and denies AVH during her hospitalization. She reports that AVH started during elementary school years. She denies paranoia or delusional thought today. Patient reports that her intentional overdose was due to her husband asking for a divorce and other family strains. Patient reoprts that she does not want to discharge back to her family home with her husband, although she is on the lease. Patient requests housing assistance, prior to discharge.   Patient previously recommended for inpatient psychiatry by an alternate provider. She is awaiting placement at this time. Admissions coordinator and MD aware.  Past Psychiatric History: see below  Risk to Self:   denies SI today, but overdosed on pills prior to hospitalizations in suicide attempt Risk to Others:  denies Prior Inpatient Therapy:  Yes. Per patient and record she was admitted inpatient in June 2024 as her most recent psychiatric hospitalization following intentional drug overdose. Prior Outpatient Therapy:  Yes  Past Medical History:  Past Medical History:  Diagnosis Date   Allergy    Anxiety    Arthritis    Asthma    Depression    Foreign body aspiration    GERD (gastroesophageal reflux disease)    Hallucinations 09/30/2014   Sizoaffective   Hyperlipidemia    Hypertension    Hypoglycemia    Intentional ingestion of batteries 04/21/2016    2 AAA batteries and 1 thumb tack; intent to hurt herself/notes 04/21/2016   Seizure (HCC) 01/16/2023   Self-inflicted injury 11/23/2017   Suicidal ideation 01/02/2018   Tardive dyskinesia 10/2014   recent onset   Thyroid disease     Past Surgical History:  Procedure Laterality Date   ABDOMINAL SURGERY     "years ago" to remove foreign objects   APPENDECTOMY     BREAST EXCISIONAL BIOPSY Right 09/03/2007   surgical bx removed fibroadeoma   BREAST LUMPECTOMY Right    CHOLECYSTECTOMY  02/2022   COLONOSCOPY WITH PROPOFOL N/A 09/10/2015   Procedure: COLONOSCOPY WITH PROPOFOL;  Surgeon: Christena Deem, MD;  Location: [facility] ENDOSCOPY;  Service: Endoscopy;  Laterality: N/A;   ESOPHAGOGASTRODUODENOSCOPY N/A 11/28/2014   Procedure: ESOPHAGOGASTRODUODENOSCOPY (EGD);  Surgeon: Scot Jun, MD;  Location: [facility] ENDOSCOPY;  Service: Endoscopy;  Laterality: N/A;   ESOPHAGOGASTRODUODENOSCOPY N/A 02/21/2016   Procedure: ESOPHAGOGASTRODUODENOSCOPY (EGD);  Surgeon: Napoleon Form, MD;  Location[facility] ENDOSCOPY;  Service: Endoscopy;  Laterality: N/A;   ESOPHAGOGASTRODUODENOSCOPY N/A 04/21/2016  Procedure: ESOPHAGOGASTRODUODENOSCOPY (EGD);  Surgeon: Iva Boop, MD;  Location: [facility] ENDOSCOPY;  Service: Endoscopy;  Laterality: N/A;    ESOPHAGOGASTRODUODENOSCOPY N/A 05/06/2016   Procedure: ESOPHAGOGASTRODUODENOSCOPY (EGD);  Surgeon: Wyline Mood, MD;  Location: [facility] ENDOSCOPY;  Service: Gastroenterology;  Laterality: N/A;   ESOPHAGOGASTRODUODENOSCOPY N/A 06/13/2016   Procedure: ESOPHAGOGASTRODUODENOSCOPY (EGD);  Surgeon: Midge Minium, MD;  Location: [facility] ENDOSCOPY;  Service: Endoscopy;  Laterality: N/A;   ESOPHAGOGASTRODUODENOSCOPY N/A 01/14/2022   Procedure: ESOPHAGOGASTRODUODENOSCOPY (EGD);  Surgeon: Toledo, Boykin Nearing, MD;  Location: [facility] ENDOSCOPY;  Service: Gastroenterology;  Laterality: N/A;   ESOPHAGOGASTRODUODENOSCOPY (EGD) WITH PROPOFOL N/A 02/29/2016   Procedure: ESOPHAGOGASTRODUODENOSCOPY (EGD) WITH PROPOFOL;  Surgeon: Midge Minium, MD;  Location: [facility] ENDOSCOPY;  Service: Endoscopy;  Laterality: N/A;   ESOPHAGOGASTRODUODENOSCOPY (EGD) WITH PROPOFOL N/A 01/15/2018   Procedure: ESOPHAGOGASTRODUODENOSCOPY (EGD) WITH PROPOFOL;  Surgeon: Corbin Ade, MD;  Location[facility] ENDO SUITE;  Service: Endoscopy;  Laterality: N/A;  retrieval forein body   LAPAROSCOPIC LYSIS OF ADHESIONS  02/16/2022   Procedure: LAPAROSCOPIC LYSIS OF ADHESIONS;  Surgeon: Henrene Dodge, MD;  Location: [facility] ORS;  Service: General;;   LAPAROTOMY N/A 09/12/2015   Procedure: EXPLORATORY LAPAROTOMY;  Surgeon: Lattie Haw, MD;  Location: [facility] ORS;  Service: General;  Laterality: N/A;   SIGMOIDOSCOPY N/A 09/12/2015   Procedure: Daiva Huge;  Surgeon: Lattie Haw, MD;  Location: [facility] ORS;  Service: General;  Laterality: N/A;   WISDOM TOOTH EXTRACTION     Family History:  Family History  Problem Relation Age of Onset   Depression Mother    Hypertension Mother    Sleep apnea Mother    Asthma Mother    COPD Mother    Diabetes Mother    Anxiety disorder Mother    Arthritis Mother    Heart failure Mother    Breast cancer Maternal Aunt        20's   Stroke Maternal Grandmother    Heart attack Maternal Grandfather    Family Psychiatric   History: see above Social History:  Social History   Substance and Sexual Activity  Alcohol Use No     Social History   Substance and Sexual Activity  Drug Use No    Social History   Socioeconomic History   Marital status: Married    Spouse name: Not on file   Number of children: 0   Years of education: 12   Highest education level: Not on file  Occupational History   Occupation: unemployed  Tobacco Use   Smoking status: Former    Current packs/day: 0.00    Average packs/day: 0.5 packs/day for 3.0 years (1.5 ttl pk-yrs)    Types: Cigarettes    Start date: 08/14/2013    Quit date: 08/14/2016    Years since quitting: 6.4    Passive exposure: Past   Smokeless tobacco: Never  Vaping Use   Vaping status: Never Used  Substance and Sexual Activity   Alcohol use: No   Drug use: No   Sexual activity: Yes    Partners: Male    Birth control/protection: None  Other Topics Concern   Not on file  Social History Narrative   Right handed    One story home   Drinks occasional caffeine   Social Determinants of Health   Financial Resource Strain: Low Risk  (01/11/2023)   Overall Financial Resource Strain (CARDIA)    Difficulty of Paying Living Expenses: Not very hard  Food Insecurity: Patient Declined (01/21/2023)   Hunger Vital Sign  Worried About Programme researcher, broadcasting/film/video in the Last Year: Patient declined    Barista in the Last Year: Patient declined  Transportation Needs: No Transportation Needs (01/21/2023)   PRAPARE - Administrator, Civil Service (Medical): No    Lack of Transportation (Non-Medical): No  Physical Activity: Sufficiently Active (01/11/2023)   Exercise Vital Sign    Days of Exercise per Week: 5 days    Minutes of Exercise per Session: 30 min  Recent Concern: Physical Activity - Insufficiently Active (12/27/2022)   Exercise Vital Sign    Days of Exercise per Week: 7 days    Minutes of Exercise per Session: 20 min  Stress: No Stress Concern  Present (01/11/2023)   [employer] of Occupational Health - Occupational Stress Questionnaire    Feeling of Stress : Not at all  Social Connections: Moderately Integrated (01/11/2023)   Social Connection and Isolation Panel [NHANES]    Frequency of Communication with Friends and Family: More than three times a week    Frequency of Social Gatherings with Friends and Family: Once a week    Attends Religious Services: More than 4 times per year    Active Member of [employer] or Organizations: No    Attends Banker Meetings: Never    Marital Status: Married   Additional Social History:    Allergies:   Allergies  Allergen Reactions   Betadine [Povidone Iodine] Hives   Fish Allergy Hives   Shellfish-Derived Products Hives    Labs:  Results for orders placed or performed during the hospital encounter of 01/20/23 (from the past 48 hour(s))  Comprehensive metabolic panel     Status: Abnormal   Collection Time: 01/20/23  7:10 PM  Result Value Ref Range   Sodium 139 135 - 145 mmol/L   Potassium 3.9 3.5 - 5.1 mmol/L   Chloride 102 98 - 111 mmol/L   CO2 25 22 - 32 mmol/L   Glucose, Bld 111 (H) 70 - 99 mg/dL    Comment: Glucose reference range applies only to samples taken after fasting for at least 8 hours.   BUN 18 6 - 20 mg/dL   Creatinine, Ser 1.61 0.44 - 1.00 mg/dL   Calcium 9.8 8.9 - 09.6 mg/dL   Total Protein 8.1 6.5 - 8.1 g/dL   Albumin 4.4 3.5 - 5.0 g/dL   AST 20 15 - 41 U/L   ALT 14 0 - 44 U/L   Alkaline Phosphatase 66 38 - 126 U/L   Total Bilirubin 0.5 0.3 - 1.2 mg/dL   GFR, Estimated >04 >54 mL/min    Comment: (NOTE) Calculated using the CKD-EPI Creatinine Equation (2021)    Anion gap 12 5 - 15    Comment: Performed at [facility], [address] [place], [place] [address]  Ethanol     Status: None   Collection Time: 01/20/23  7:10 PM  Result Value Ref Range   Alcohol, Ethyl (B) <10 <10 mg/dL    Comment: (NOTE) Lowest detectable limit for  serum alcohol is 10 mg/dL.  For medical purposes only. Performed at [facility], [address] [place], [place] [address]   Salicylate level     Status: Abnormal   Collection Time: 01/20/23  7:10 PM  Result Value Ref Range   Salicylate Lvl <7.0 (L) 7.0 - 30.0 mg/dL    Comment: Performed at [facility], [address] [place], [place] [address]  Acetaminophen level  Status: Abnormal   Collection Time: 01/20/23  7:10 PM  Result Value Ref Range   Acetaminophen (Tylenol), Serum <10 (L) 10 - 30 ug/mL    Comment: (NOTE) Therapeutic concentrations vary significantly. A range of 10-30 ug/mL  may be an effective concentration for many patients. However, some  are best treated at concentrations outside of this range. Acetaminophen concentrations >150 ug/mL at 4 hours after ingestion  and >50 ug/mL at 12 hours after ingestion are often associated with  toxic reactions.  Performed at [facility], [address] [place], [place] [address]   cbc     Status: Abnormal   Collection Time: 01/20/23  7:10 PM  Result Value Ref Range   WBC 11.1 (H) 4.0 - 10.5 K/uL   RBC 4.86 3.87 - 5.11 MIL/uL   Hemoglobin 14.8 12.0 - 15.0 g/dL   HCT 60.4 54.0 - 98.1 %   MCV 93.4 80.0 - 100.0 fL   MCH 30.5 26.0 - 34.0 pg   MCHC 32.6 30.0 - 36.0 g/dL   RDW 19.1 47.8 - 29.5 %   Platelets 226 150 - 400 K/uL   nRBC 0.0 0.0 - 0.2 %    Comment: Performed at [facility], [address], [place], [place] [address]  Urine Drug Screen, Qualitative     Status: None   Collection Time: 01/20/23  7:10 PM  Result Value Ref Range   Tricyclic, Ur Screen NONE DETECTED NONE DETECTED   Amphetamines, Ur Screen NONE DETECTED NONE DETECTED   MDMA (Ecstasy)Ur Screen NONE DETECTED NONE DETECTED   Cocaine Metabolite,Ur [place] NONE DETECTED NONE DETECTED   Opiate, Ur Screen NONE DETECTED NONE DETECTED   Phencyclidine (PCP) Ur S NONE DETECTED NONE DETECTED   Cannabinoid 50 Ng, Ur [place] NONE  DETECTED NONE DETECTED   Barbiturates, Ur Screen NONE DETECTED NONE DETECTED   Benzodiazepine, Ur Scrn NONE DETECTED NONE DETECTED   Methadone Scn, Ur NONE DETECTED NONE DETECTED    Comment: (NOTE) Tricyclics + metabolites, urine    Cutoff 1000 ng/mL Amphetamines + metabolites, urine  Cutoff 1000 ng/mL MDMA (Ecstasy), urine              Cutoff 500 ng/mL Cocaine Metabolite, urine          Cutoff 300 ng/mL Opiate + metabolites, urine        Cutoff 300 ng/mL Phencyclidine (PCP), urine         Cutoff 25 ng/mL Cannabinoid, urine                 Cutoff 50 ng/mL Barbiturates + metabolites, urine  Cutoff 200 ng/mL Benzodiazepine, urine              Cutoff 200 ng/mL Methadone, urine                   Cutoff 300 ng/mL  The urine drug screen provides only a preliminary, unconfirmed analytical test result and should not be used for non-medical purposes. Clinical consideration and professional judgment should be applied to any positive drug screen result due to possible interfering substances. A more specific alternate chemical method must be used in order to obtain a confirmed analytical result. Gas chromatography / mass spectrometry (GC/MS) is the preferred confirm atory method. Performed at [facility], [address] [place], [place] [address]   Lithium level     Status: None   Collection Time: 01/20/23  7:10 PM  Result Value Ref Range   Lithium Lvl 0.86  0.60 - 1.20 mmol/L    Comment: Performed at [facility] [address] [place], [place] [address]  CBG monitoring, ED     Status: Abnormal   Collection Time: 01/20/23  7:20 PM  Result Value Ref Range   Glucose-Capillary 110 (H) 70 - 99 mg/dL    Comment: Glucose reference range applies only to samples taken after fasting for at least 8 hours.  POC urine preg, ED     Status: None   Collection Time: 01/20/23  7:25 PM  Result Value Ref Range   Preg Test, Ur Negative Negative  Lithium level     Status: Abnormal    Collection Time: 01/21/23 12:08 AM  Result Value Ref Range   Lithium Lvl 1.36 (H) 0.60 - 1.20 mmol/L    Comment: Performed at [facility], [address], [place], [place] [address]  Basic metabolic panel     Status: Abnormal   Collection Time: 01/21/23 12:08 AM  Result Value Ref Range   Sodium 141 135 - 145 mmol/L   Potassium 3.9 3.5 - 5.1 mmol/L   Chloride 108 98 - 111 mmol/L   CO2 25 22 - 32 mmol/L   Glucose, Bld 109 (H) 70 - 99 mg/dL    Comment: Glucose reference range applies only to samples taken after fasting for at least 8 hours.   BUN 15 6 - 20 mg/dL   Creatinine, Ser 6.64 0.44 - 1.00 mg/dL   Calcium 9.5 8.9 - 40.3 mg/dL   GFR, Estimated >47 >42 mL/min    Comment: (NOTE) Calculated using the CKD-EPI Creatinine Equation (2021)    Anion gap 8 5 - 15    Comment: Performed at [facility], [address] [place], [place] [address]  Lithium level     Status: Abnormal   Collection Time: 01/21/23  4:52 AM  Result Value Ref Range   Lithium Lvl 1.31 (H) 0.60 - 1.20 mmol/L    Comment: Performed at [facility], [address] [place], [place] [address]  Acetaminophen level     Status: Abnormal   Collection Time: 01/21/23  4:52 AM  Result Value Ref Range   Acetaminophen (Tylenol), Serum <10 (L) 10 - 30 ug/mL    Comment: (NOTE) Therapeutic concentrations vary significantly. A range of 10-30 ug/mL  may be an effective concentration for many patients. However, some  are best treated at concentrations outside of this range. Acetaminophen concentrations >150 ug/mL at 4 hours after ingestion  and >50 ug/mL at 12 hours after ingestion are often associated with  toxic reactions.  Performed at [facility], [address] [place], [place] [address]   CBC     Status: None   Collection Time: 01/21/23  4:52 AM  Result Value Ref Range   WBC 10.5 4.0 - 10.5 K/uL   RBC 4.43 3.87 - 5.11 MIL/uL   Hemoglobin 13.4 12.0 - 15.0 g/dL   HCT 18.8 41.6  - 60.6 %   MCV 93.2 80.0 - 100.0 fL   MCH 30.2 26.0 - 34.0 pg   MCHC 32.4 30.0 - 36.0 g/dL   RDW 30.1 60.1 - 09.3 %   Platelets 203 150 - 400 K/uL   nRBC 0.0 0.0 - 0.2 %    Comment: Performed at [facility], [address] [place], [place] [address]  Comprehensive metabolic panel     Status: Abnormal   Collection Time: 01/21/23  4:52 AM  Result Value Ref Range  Sodium 140 135 - 145 mmol/L   Potassium 4.1 3.5 - 5.1 mmol/L   Chloride 111 98 - 111 mmol/L   CO2 23 22 - 32 mmol/L   Glucose, Bld 105 (H) 70 - 99 mg/dL    Comment: Glucose reference range applies only to samples taken after fasting for at least 8 hours.   BUN 13 6 - 20 mg/dL   Creatinine, Ser 4.09 0.44 - 1.00 mg/dL   Calcium 9.2 8.9 - 81.1 mg/dL   Total Protein 7.2 6.5 - 8.1 g/dL   Albumin 3.8 3.5 - 5.0 g/dL   AST 17 15 - 41 U/L   ALT 14 0 - 44 U/L   Alkaline Phosphatase 52 38 - 126 U/L   Total Bilirubin 0.7 0.3 - 1.2 mg/dL   GFR, Estimated >91 >47 mL/min    Comment: (NOTE) Calculated using the CKD-EPI Creatinine Equation (2021)    Anion gap 6 5 - 15    Comment: Performed at [facility], [address], [place], [place] [address]  Lithium level     Status: None   Collection Time: 01/21/23  3:21 PM  Result Value Ref Range   Lithium Lvl 0.61 0.60 - 1.20 mmol/L    Comment: Performed at [facility], [address] [place], [place] [address]  Comprehensive metabolic panel     Status: None   Collection Time: 01/22/23  3:10 AM  Result Value Ref Range   Sodium 137 135 - 145 mmol/L   Potassium 3.8 3.5 - 5.1 mmol/L   Chloride 107 98 - 111 mmol/L   CO2 24 22 - 32 mmol/L   Glucose, Bld 95 70 - 99 mg/dL    Comment: Glucose reference range applies only to samples taken after fasting for at least 8 hours.   BUN 13 6 - 20 mg/dL   Creatinine, Ser 6.57 0.44 - 1.00 mg/dL   Calcium 9.2 8.9 - 84.6 mg/dL   Total Protein 6.7 6.5 - 8.1 g/dL   Albumin 3.6 3.5 - 5.0 g/dL   AST 16 15 - 41 U/L   ALT  12 0 - 44 U/L   Alkaline Phosphatase 51 38 - 126 U/L   Total Bilirubin 0.9 0.3 - 1.2 mg/dL   GFR, Estimated >96 >29 mL/min    Comment: (NOTE) Calculated using the CKD-EPI Creatinine Equation (2021)    Anion gap 6 5 - 15    Comment: Performed at [facility], [address] [place], [place] [address]    Current Facility-Administered Medications  Medication Dose Route Frequency Provider Last Rate Last Admin   acetaminophen (TYLENOL) tablet 650 mg  650 mg Oral Q6H PRN Mansy, Jan A, MD       Or   acetaminophen (TYLENOL) suppository 650 mg  650 mg Rectal Q6H PRN Mansy, Jan A, MD       albuterol (VENTOLIN HFA) 108 (90 Base) MCG/ACT inhaler 2 puff  2 puff Inhalation Q6H PRN Otelia Sergeant, RPH       ARIPiprazole (ABILIFY) tablet 5 mg  5 mg Oral Daily Fitzhugh Vizcarrondo, NP       enoxaparin (LOVENOX) injection 60 mg  60 mg Subcutaneous Q24H Mansy, Jan A, MD   60 mg at 01/22/23 0815   furosemide (LASIX) tablet 20 mg  20 mg Oral Daily Mansy, Jan A, MD   20 mg at 01/22/23 0816   magnesium hydroxide (MILK OF MAGNESIA) suspension 30 mL  30 mL Oral Daily PRN Mansy, Jan  A, MD       ondansetron (ZOFRAN) tablet 4 mg  4 mg Oral Q6H PRN Mansy, Jan A, MD   4 mg at 01/22/23 0981   Or   ondansetron [facility]) injection 4 mg  4 mg Intravenous Q6H PRN Mansy, Jan A, MD   4 mg at 01/21/23 1853   pantoprazole (PROTONIX) EC tablet 40 mg  40 mg Oral Daily Mansy, Jan A, MD   40 mg at 01/22/23 0816   polyethylene glycol (MIRALAX / GLYCOLAX) packet 17 g  17 g Oral Daily Esaw Grandchild A, DO   17 g at 01/22/23 1914   prochlorperazine (COMPAZINE) injection 10 mg  10 mg Intravenous Q6H PRN Esaw Grandchild A, DO   10 mg at 01/22/23 1124   SUMAtriptan (IMITREX) tablet 25 mg  25 mg Oral Q2H PRN Esaw Grandchild A, DO   25 mg at 01/21/23 1525   vitamin B-12 (CYANOCOBALAMIN) tablet 100 mcg  100 mcg Oral Daily Mansy, Jan A, MD   100 mcg at 01/22/23 7829    Musculoskeletal: Strength & Muscle Tone:  n/a Gait & Station:   n/a Patient leans: N/A            Psychiatric Specialty Exam:  Presentation  General Appearance:  Appropriate for Environment  Eye Contact: Good  Speech: Clear and Coherent  Speech Volume: Normal  Handedness: Right   Mood and Affect  Mood: Depressed  Affect: Congruent; Tearful   Thought Process  Thought Processes: Coherent  Descriptions of Associations:Intact  Orientation:Full (Time, Place and Person)  Thought Content:Logical  History of Schizophrenia/Schizoaffective disorder:No  Duration of Psychotic Symptoms:No data recorded Hallucinations:Hallucinations: None  Ideas of Reference:None  Suicidal Thoughts:Suicidal Thoughts: -- (denies)  Homicidal Thoughts:Homicidal Thoughts: No   Sensorium  Memory: Immediate Good; Recent Good; Remote Good  Judgment: Poor  Insight: Fair   Chartered certified accountant: Fair  Attention Span: Fair  Recall: [employer] of Knowledge: Fair  Language: Good   Psychomotor Activity  Psychomotor Activity: Psychomotor Activity: Normal   Assets  Assets: Communication Skills   Sleep  Sleep: Sleep: Fair Number of Hours of Sleep: 6   Physical Exam: Physical Exam Vitals and nursing note reviewed.  Neurological:     Mental Status: She is oriented to person, place, and time.    Review of Systems  Psychiatric/Behavioral:  Positive for depression.   All other systems reviewed and are negative.  Blood pressure 138/85, pulse 66, temperature 97.9 F (36.6 C), resp. rate 18, height 5\' 8"  (1.727 m), weight 122.5 kg, last menstrual period 01/13/2023, SpO2 100%, unknown if currently breastfeeding. Body mass index is 41.05 kg/m.  Treatment Plan Summary: Daily contact with patient to assess and evaluate symptoms and progress in treatment. Abilify 5 mg PO daily restarted. TOC consult for housing assistance ordered.  Disposition:  Patient previously recommended for inpatient psychiatry by  alternative provider.  Awaiting placement at this time.  Mcneil Sober, NP 01/22/2023 11:34 AM

## 2023-01-22 NOTE — Assessment & Plan Note (Signed)
Continue Imitrex PRN

## 2023-01-22 NOTE — Hospital Course (Addendum)
HPI on admission 01/21/23 AM: "Melanie Bell is a 32 y.o. female with medical history significant for asthma, depression, osteoarthritis, anxiety, schizoaffective bipolar disorder, who presented to the emergency room with acute onset of suicidal/intentional multiple drug overdose including Abilify, Dostinex, Lexapro, Lasix, Synthroid, lithium carbonate, potassium chloride and Topamax.  She took 350 mg of p.o. Topamax 70 mg of potassium chloride and 4200 mg of lithium carbonate as well as 525 mcg of levothyroxine 140 mg of I p.o. Lasix and 70 mg of p.o. to Lexapro and 0.5 mg of p.o. Dostinex with 35 mg of p.o. Abilify.   Initially female was unremarkable and later on elevated.  Possible control recommended supportive treatment with IV fluids and monitor BMP and renal functions as well as EKG.  The patient has been having mild nausea and vomiting yesterday without diarrhea.  She stated that she has been overwhelmed and under a lot of stress lately.  She has been feeling thirsty. ..."  ED course -- stable vitals other than RR 24.   Labs - CMP unremarkable, CBC with WBC 11.1. Normal tylenol and salicylate levels Initial lithium level normal, repeat elevated. EKG sinus tachycardia HR 102. Poison control was contacted, recommended admission for telemetry monitoring and IV fluids, monitor lithium levels.  Patient was evaluated by psychiatry and recommended for inpatient psych admission once medically clear.  01/22/23 - lithium level normalized.  Pt vomiting this AM after breakfast.   01/23/23 - pt stable from medical standpoint.  Awaiting [facility]/psych admission.

## 2023-01-23 DIAGNOSIS — T50912A Poisoning by multiple unspecified drugs, medicaments and biological substances, intentional self-harm, initial encounter: Secondary | ICD-10-CM | POA: Diagnosis not present

## 2023-01-23 NOTE — Plan of Care (Signed)

## 2023-01-23 NOTE — Progress Notes (Signed)
   01/23/23 1100  Spiritual Encounters  Type of Visit Initial  Care provided to: Patient  Conversation partners present during encounter Other (comment)  Referral source Patient request  Reason for visit Routine spiritual support  OnCall Visit No  Spiritual Framework  Presenting Themes Impactful experiences and emotions;Courage hope and growth;Values and beliefs;Meaning/purpose/sources of inspiration  Community/Connection Family  Patient Stress Factors Loss of control  Family Stress Factors Family relationships  Interventions  Spiritual Care Interventions Made Reflective listening;Encouragement;Prayer;Decision-making support/facilitation  Intervention Outcomes  Outcomes Connection to spiritual care;Awareness around self/spiritual resourses;Reduced fear;Reduced anxiety;Awareness of support  Spiritual Care Plan  Spiritual Care Issues Still Outstanding No further spiritual care needs at this time (see row info)   Chaplain received a spiritual care consult for prayer. Chaplain met with patient in her room. Patient shared with the chaplain struggles that she has been facing with her family and with her marriage. Patient also shared with chaplain how important her faith is to her and how important living is to her. Chaplain prayed for the patient and offered her words of hope and encouragement. Chaplain reminded the patient to find things that bring her joy and that are life giving.

## 2023-01-23 NOTE — BH Assessment (Signed)
Patient is to be admitted to [facility] on tomm morning 01/24/23 after 8:00am by Dr.  Marval Regal .  Attending Physician will be Dr.  Marval Regal .   Patient has been assigned to room 320, by [facility] Charge Nurse Gigi.    ER staff is aware of the admission:  Rob Patient Access.

## 2023-01-23 NOTE — Progress Notes (Signed)
Progress Note   Patient: Melanie Bell NGE:[id number] DOB: [birth year] DOA: 01/20/2023     2 DOS: the patient was seen and examined on 01/23/2023   Brief hospital course: HPI on admission 01/21/23 AM: "Melanie Bell is a 32 y.o. female with medical history significant for asthma, depression, osteoarthritis, anxiety, schizoaffective bipolar disorder, who presented to the emergency room with acute onset of suicidal/intentional multiple drug overdose including Abilify, Dostinex, Lexapro, Lasix, Synthroid, lithium carbonate, potassium chloride and Topamax.  She took 350 mg of p.o. Topamax 70 mg of potassium chloride and 4200 mg of lithium carbonate as well as 525 mcg of levothyroxine 140 mg of I p.o. Lasix and 70 mg of p.o. to Lexapro and 0.5 mg of p.o. Dostinex with 35 mg of p.o. Abilify.   Initially female was unremarkable and later on elevated.  Possible control recommended supportive treatment with IV fluids and monitor BMP and renal functions as well as EKG.  The patient has been having mild nausea and vomiting yesterday without diarrhea.  She stated that she has been overwhelmed and under a lot of stress lately.  She has been feeling thirsty. ..."  ED course -- stable vitals other than RR 24.   Labs - CMP unremarkable, CBC with WBC 11.1. Normal tylenol and salicylate levels Initial lithium level normal, repeat elevated. EKG sinus tachycardia HR 102. Poison control was contacted, recommended admission for telemetry monitoring and IV fluids, monitor lithium levels.  Patient was evaluated by psychiatry and recommended for inpatient psych admission once medically clear.  01/22/23 - lithium level normalized.  Pt vomiting this AM after breakfast.   01/23/23 - pt stable from medical standpoint.  Awaiting [facility]/psych admission.     Assessment and Plan: * Drug overdose, multiple drugs, intentional self-harm, initial encounter [facility]) --Psychiatry consulted on admission - recommendation is inpatient  psych admission once medically clear --Continue suicide precautions with sitter --Daily contact by psychiatry team for ongoing assessment --Resumption of psych meds per psychiatry ----Abilify resumed 9/8 ----Holding lithium, Lexapro  Insomnia Resume home trazodone PRN  Mild intermittent asthma Continue albuterol PRN  Hypothyroidism Continue Synthroid  Schizoaffective disorder, bipolar type [facility]) - Psychiatry is following - defer to their recommendations for medications --Resumed on Abilify 9/8 --Lithium & Lexapro on hold  Migraine without aura and without status migrainosus, not intractable Continue Imitrex PRN        Subjective: Pt awake sitting up in bed this AM, sitter at bedside.  Got Compazine for recurrent nausea last night.  Slowly feeling better just has mild nausea intermittently.  Tolerating PO intake.   Physical Exam: Vitals:   01/21/23 1758 01/22/23 0804 01/22/23 2334 01/23/23 0741  BP: 128/79 138/85 116/67 117/70  Pulse: 62 66 (!) 56 (!) 50  Resp: 14 18 15 18   Temp: 98.2 F (36.8 C) 97.9 F (36.6 C) 98.3 F (36.8 C) 98.4 F (36.9 C)  TempSrc:      SpO2: 100% 100% 97% 100%  Weight:      Height:       General exam: awake, alert, no acute distress HEENT: moist mucus membranes, hearing grossly normal  Respiratory system: on room air, normal respiratory effort. Cardiovascular system: RRR, no pedal edema.   Gastrointestinal system: soft, NT, ND Central nervous system: A&O x4. no gross focal neurologic deficits, normal speech Extremities: moves all, no edema, normal tone Skin: dry, intact, normal temperature Psychiatry: normal mood, congruent affect, judgement and insight appear normal   Data Reviewed:  Notable labs ---  Lithium level normalized . Normal CMP. Normal CBC.  Family Communication: None  Disposition: Status is: Inpatient Remains inpatient appropriate because:  Awaiting psychiatric admission      Planned Discharge Destination:   Inpatient behavioral / psych .  Pt is medically stable for discharge.      Time spent: 35 minutes  Author: Pennie Banter, DO 01/23/2023 8:42 AM  For on call review [email or web address].

## 2023-01-24 ENCOUNTER — Other Ambulatory Visit: Payer: Self-pay

## 2023-01-24 ENCOUNTER — Inpatient Hospital Stay
Admission: AD | Admit: 2023-01-24 | Discharge: 2023-01-30 | DRG: 885 | Disposition: A | Discharge status: Home / Self Care | Payer: No Typology Code available for payment source | Source: Intra-hospital | Attending: Psychiatry | Admitting: Psychiatry

## 2023-01-24 ENCOUNTER — Encounter: Payer: Self-pay | Admitting: Urology

## 2023-01-24 ENCOUNTER — Ambulatory Visit: Payer: Medicaid Other

## 2023-01-24 DIAGNOSIS — F250 Schizoaffective disorder, bipolar type: Secondary | ICD-10-CM | POA: Diagnosis not present

## 2023-01-24 DIAGNOSIS — F419 Anxiety disorder, unspecified: Secondary | ICD-10-CM | POA: Diagnosis present

## 2023-01-24 DIAGNOSIS — T50902A Poisoning by unspecified drugs, medicaments and biological substances, intentional self-harm, initial encounter: Secondary | ICD-10-CM | POA: Diagnosis present

## 2023-01-24 DIAGNOSIS — Z823 Family history of stroke: Secondary | ICD-10-CM

## 2023-01-24 DIAGNOSIS — Z5986 Financial insecurity: Secondary | ICD-10-CM | POA: Diagnosis not present

## 2023-01-24 DIAGNOSIS — Z87891 Personal history of nicotine dependence: Secondary | ICD-10-CM

## 2023-01-24 DIAGNOSIS — R4587 Impulsiveness: Secondary | ICD-10-CM | POA: Diagnosis present

## 2023-01-24 DIAGNOSIS — Z560 Unemployment, unspecified: Secondary | ICD-10-CM | POA: Diagnosis not present

## 2023-01-24 DIAGNOSIS — G4700 Insomnia, unspecified: Secondary | ICD-10-CM | POA: Diagnosis present

## 2023-01-24 DIAGNOSIS — Z825 Family history of asthma and other chronic lower respiratory diseases: Secondary | ICD-10-CM | POA: Diagnosis not present

## 2023-01-24 DIAGNOSIS — F251 Schizoaffective disorder, depressive type: Secondary | ICD-10-CM | POA: Diagnosis not present

## 2023-01-24 DIAGNOSIS — Z8249 Family history of ischemic heart disease and other diseases of the circulatory system: Secondary | ICD-10-CM

## 2023-01-24 DIAGNOSIS — Z833 Family history of diabetes mellitus: Secondary | ICD-10-CM | POA: Diagnosis not present

## 2023-01-24 DIAGNOSIS — I10 Essential (primary) hypertension: Secondary | ICD-10-CM | POA: Diagnosis present

## 2023-01-24 DIAGNOSIS — Z79890 Hormone replacement therapy (postmenopausal): Secondary | ICD-10-CM

## 2023-01-24 DIAGNOSIS — T50912A Poisoning by multiple unspecified drugs, medicaments and biological substances, intentional self-harm, initial encounter: Secondary | ICD-10-CM | POA: Diagnosis not present

## 2023-01-24 DIAGNOSIS — Z9151 Personal history of suicidal behavior: Secondary | ICD-10-CM

## 2023-01-24 DIAGNOSIS — Z818 Family history of other mental and behavioral disorders: Secondary | ICD-10-CM | POA: Diagnosis not present

## 2023-01-24 DIAGNOSIS — T1491XA Suicide attempt, initial encounter: Secondary | ICD-10-CM | POA: Diagnosis not present

## 2023-01-24 DIAGNOSIS — Z79899 Other long term (current) drug therapy: Secondary | ICD-10-CM | POA: Diagnosis not present

## 2023-01-24 MED ORDER — POLYETHYLENE GLYCOL 3350 17 G PO PACK
17.0000 g | PACK | Freq: Every day | ORAL | Status: DC
  Administered 2023-01-24 – 2023-01-28 (×4): 17 g via ORAL
  Filled 2023-01-24 (×5): qty 1

## 2023-01-24 MED ORDER — MAGNESIUM HYDROXIDE 400 MG/5ML PO SUSP: 30.0000 mL | Freq: Every day | ORAL | Status: DC | PRN

## 2023-01-24 MED ORDER — PANTOPRAZOLE SODIUM 40 MG PO TBEC
40.0000 mg | DELAYED_RELEASE_TABLET | Freq: Every day | ORAL | Status: DC
  Administered 2023-01-25 – 2023-01-30 (×6): 40 mg via ORAL
  Filled 2023-01-24 (×6): qty 1

## 2023-01-24 MED ORDER — SUMATRIPTAN SUCCINATE 25 MG PO TABS: 25.0000 mg | ORAL_TABLET | ORAL | Status: DC | PRN

## 2023-01-24 MED ORDER — LORAZEPAM 2 MG/ML IJ SOLN: 2.0000 mg | Freq: Two times a day (BID) | INTRAMUSCULAR | Status: DC | PRN

## 2023-01-24 MED ORDER — HYDROXYZINE HCL 50 MG PO TABS
50.0000 mg | ORAL_TABLET | Freq: Three times a day (TID) | ORAL | Status: DC | PRN
  Administered 2023-01-24 – 2023-01-28 (×2): 50 mg via ORAL
  Filled 2023-01-24 (×2): qty 1

## 2023-01-24 MED ORDER — HALOPERIDOL 5 MG PO TABS
5.0000 mg | ORAL_TABLET | Freq: Two times a day (BID) | ORAL | Status: DC | PRN
  Administered 2023-01-28: 5 mg via ORAL
  Filled 2023-01-24: qty 1

## 2023-01-24 MED ORDER — VITAMIN B-12 100 MCG PO TABS
100.0000 ug | ORAL_TABLET | Freq: Every day | ORAL | Status: DC
  Administered 2023-01-25 – 2023-01-30 (×6): 100 ug via ORAL
  Filled 2023-01-24 (×6): qty 1

## 2023-01-24 MED ORDER — ARIPIPRAZOLE 5 MG PO TABS
5.0000 mg | ORAL_TABLET | Freq: Every day | ORAL | Status: DC
  Administered 2023-01-25 – 2023-01-27 (×3): 5 mg via ORAL
  Filled 2023-01-24 (×3): qty 1

## 2023-01-24 MED ORDER — DIPHENHYDRAMINE HCL 25 MG PO CAPS
50.0000 mg | ORAL_CAPSULE | Freq: Two times a day (BID) | ORAL | Status: DC | PRN
  Administered 2023-01-28: 50 mg via ORAL
  Filled 2023-01-24: qty 2

## 2023-01-24 MED ORDER — ALUM & MAG HYDROXIDE-SIMETH 200-200-20 MG/5ML PO SUSP
15.0000 mL | ORAL | Status: DC | PRN
  Filled 2023-01-24: qty 30

## 2023-01-24 MED ORDER — LORAZEPAM 2 MG PO TABS
2.0000 mg | ORAL_TABLET | Freq: Two times a day (BID) | ORAL | Status: DC | PRN
  Administered 2023-01-28: 2 mg via ORAL
  Filled 2023-01-24: qty 1

## 2023-01-24 MED ORDER — ENOXAPARIN SODIUM 60 MG/0.6ML IJ SOSY
60.0000 mg | PREFILLED_SYRINGE | INTRAMUSCULAR | Status: DC
  Filled 2023-01-24: qty 0.6

## 2023-01-24 MED ORDER — DIPHENHYDRAMINE HCL 50 MG/ML IJ SOLN: 50.0000 mg | Freq: Two times a day (BID) | INTRAMUSCULAR | Status: DC | PRN

## 2023-01-24 MED ORDER — HALOPERIDOL LACTATE 5 MG/ML IJ SOLN: 5.0000 mg | Freq: Two times a day (BID) | INTRAMUSCULAR | Status: DC | PRN

## 2023-01-24 MED ORDER — ACETAMINOPHEN 325 MG PO TABS
650.0000 mg | ORAL_TABLET | Freq: Four times a day (QID) | ORAL | Status: DC | PRN
  Administered 2023-01-25 – 2023-01-26 (×3): 650 mg via ORAL
  Filled 2023-01-24 (×3): qty 2

## 2023-01-24 MED ORDER — FUROSEMIDE 20 MG PO TABS
20.0000 mg | ORAL_TABLET | Freq: Every day | ORAL | Status: DC
  Administered 2023-01-25 – 2023-01-30 (×6): 20 mg via ORAL
  Filled 2023-01-24 (×6): qty 1

## 2023-01-24 MED ORDER — ALBUTEROL SULFATE HFA 108 (90 BASE) MCG/ACT IN AERS: 2.0000 | INHALATION_SPRAY | Freq: Four times a day (QID) | RESPIRATORY_TRACT | Status: DC | PRN

## 2023-01-24 MED ORDER — TRAZODONE HCL 50 MG PO TABS
50.0000 mg | ORAL_TABLET | Freq: Every evening | ORAL | Status: DC | PRN
  Administered 2023-01-24 – 2023-01-28 (×5): 50 mg via ORAL
  Filled 2023-01-24 (×5): qty 1

## 2023-01-24 NOTE — Group Note (Signed)
Date:  01/24/2023 Time:  10:08 AM  Group Topic/Focus:  Goals Group:   The focus of this group is to help patients establish daily goals to achieve during treatment and discuss how the patient can incorporate goal setting into their daily lives to aide in recovery. Outdoor Recreation Structured Pharmacist, community.    Participation Level:  Did Not Attend   Terrisa Curfman 01/24/2023, 10:08 AM

## 2023-01-24 NOTE — Group Note (Signed)
Date:  01/24/2023 Time:  4:11 PM  Group Topic/Focus:  Outdoor recreation. Music Therapy Rapport building.    Participation Level:  Active  Participation Quality:  Appropriate  Affect:  Appropriate  Cognitive:  Alert, Appropriate, and Oriented  Insight: Appropriate  Engagement in Group:  Developing/Improving and Engaged  Modes of Intervention:  Activity, Rapport Building, and Socialization  Additional Comments:    Rosaura Carpenter 01/24/2023, 4:11 PM

## 2023-01-24 NOTE — Progress Notes (Signed)
   01/24/23 1600  Spiritual Encounters  Type of Visit Follow up  Care provided to: Patient  Conversation partners present during encounter Nurse  Referral source Patient request  Reason for visit Routine spiritual support  OnCall Visit Yes  Spiritual Framework  Presenting Themes Goals in life/care  Community/Connection Family  Patient Stress Factors Exhausted;Loss of control  Interventions  Spiritual Care Interventions Made Reflective listening;Normalization of emotions  Intervention Outcomes  Outcomes Reduced anxiety;Reduced fear;Awareness of support  Spiritual Care Plan  Spiritual Care Issues Still Outstanding No further spiritual care needs at this time (see row info)   Patient requested to speak with the chaplain. Patient wanted the chaplain to know that she was feeling much better today. Patient is looking forward to speaking with the social worker and with her family. Patient thanked the chaplain for coming to visit with her.

## 2023-01-24 NOTE — Progress Notes (Signed)
Patient has left for BMU

## 2023-01-24 NOTE — Progress Notes (Signed)
Patient was brought down to the BMU from [facility] without being discharged from the system which means she could not be brought onto the BMU. This Clinical research associate called [facility] and told them that they needed to d/c the patient, [facility] stated that the patient did not have any discharge orders, and they thought it was just a transfer. Patient arrived to BMU sally port around 11 am and it is currently 12:39 and the patient is still in the sally port. This Clinical research associate contacted [facility] to inform of situation when happened. Currently at 12:40pm, the patient still has not been discharged from the medical floor and is still sitting in the sally port.

## 2023-01-24 NOTE — Progress Notes (Signed)
Pt calm and pleasant during assessment denying SI/HI/AVH. Pt stated she was having a hard time because her husband wants to file for divorce. Pt given support. Pt compliant with medication administration per MD orders. Pt given education, support, and encouragement to be active in her treatment plan. Pt being monitored Q 15 minutes for safety per unit protocol, remains safe on the unit

## 2023-01-24 NOTE — Group Note (Signed)
Date:  01/24/2023 Time:  9:28 PM  Group Topic/Focus:  Personal Choices and Values:   The focus of this group is to help patients assess and explore the importance of values in their lives, how their values affect their decisions, how they express their values and what opposes their expression.    Participation Level:  Active  Participation Quality:  Appropriate and Attentive  Affect:  Appropriate and Excited  Cognitive:  Alert and Appropriate  Insight: Appropriate and Good  Engagement in Group:  Developing/Improving and Engaged  Modes of Intervention:  Activity, Clarification, Discussion, Exploration, Problem-solving, Rapport Building, Dance movement psychotherapist, Socialization, and Support  Additional Comments:     Aveena Bari 01/24/2023, 9:28 PM

## 2023-01-24 NOTE — Progress Notes (Signed)
Report was called to BMU  and given to nurse. Nurse will call when she can receive patient.

## 2023-01-24 NOTE — Progress Notes (Signed)
Admission Note:   Report was received from Andrill, RN on a 32 year-old-female who presents IVC in no acute distress for the treatment of SI by OD. Patient was calm and cooperative with admission process. Patient stated that she was having some marital and family issues with her in-laws, which is why she acted on an impulse and "took a whole bubble pack of pills". Patient stated that she called the ambulance when she realized what she did, because her in-laws weren't going to call the ambulance for her. Patient presents with anxiety, stating that it's "just because I don't know the next steps and where I'm going to go when I leave". Patient stated that her Mom told her that her husband is adamant that he wants a divorce and that she will need to find somewhere else to live. Patient denies SI/HI/AVH and pain at this time. Patient also denies sign/symptoms of depression, "I'm just trying to figure things out. I'm upset and hurt". Patient has a past medical history of Asthma, Anxiety, PTSD, HTN, and GERD. Patient's goal for treatment is to "work on getting myself better to work on my relationship with my husband, if he wants to work it out". Skin was assessed with Raycha, RN and found to have a healed surgical scar down the middle of her abdomen, a blister to her left big toe, bruise on her lower, right abdomen, from Lovenox injections, per patient. Patient also has some red bumps to her face and neck. Patient searched and no contraband found and unit policies explained and understanding verbalized. Consents obtained. Food and fluids offered, and both accepted. Patient had no additional questions or concerns to voice at this time. Patient remains safe on the unit.

## 2023-01-24 NOTE — Plan of Care (Signed)
Pt new to the unit today, hasn't had time to progress  Problem: Education: Goal: Knowledge of General Education information will improve Description: Including pain rating scale, medication(s)/side effects and non-pharmacologic comfort measures Outcome: Not Progressing   Problem: Health Behavior/Discharge Planning: Goal: Ability to manage health-related needs will improve Outcome: Not Progressing   Problem: Clinical Measurements: Goal: Ability to maintain clinical measurements within normal limits will improve Outcome: Not Progressing Goal: Will remain free from infection Outcome: Not Progressing Goal: Diagnostic test results will improve Outcome: Not Progressing Goal: Respiratory complications will improve Outcome: Not Progressing Goal: Cardiovascular complication will be avoided Outcome: Not Progressing   Problem: Activity: Goal: Risk for activity intolerance will decrease Outcome: Not Progressing   Problem: Nutrition: Goal: Adequate nutrition will be maintained Outcome: Not Progressing   Problem: Coping: Goal: Level of anxiety will decrease Outcome: Not Progressing   Problem: Elimination: Goal: Will not experience complications related to bowel motility Outcome: Not Progressing Goal: Will not experience complications related to urinary retention Outcome: Not Progressing   Problem: Pain Managment: Goal: General experience of comfort will improve Outcome: Not Progressing   Problem: Safety: Goal: Ability to remain free from injury will improve Outcome: Not Progressing   Problem: Skin Integrity: Goal: Risk for impaired skin integrity will decrease Outcome: Not Progressing   Problem: Education: Goal: Knowledge of [place] [facility] Education information/materials will improve Outcome: Not Progressing Goal: Emotional status will improve Outcome: Not Progressing Goal: Mental status will improve Outcome: Not Progressing Goal: Verbalization of understanding the  information provided will improve Outcome: Not Progressing   Problem: Activity: Goal: Interest or engagement in activities will improve Outcome: Not Progressing Goal: Sleeping patterns will improve Outcome: Not Progressing   Problem: Coping: Goal: Ability to verbalize frustrations and anger appropriately will improve Outcome: Not Progressing Goal: Ability to demonstrate self-control will improve Outcome: Not Progressing   Problem: Health Behavior/Discharge Planning: Goal: Identification of resources available to assist in meeting health care needs will improve Outcome: Not Progressing Goal: Compliance with treatment plan for underlying cause of condition will improve Outcome: Not Progressing   Problem: Physical Regulation: Goal: Ability to maintain clinical measurements within normal limits will improve Outcome: Not Progressing   Problem: Safety: Goal: Periods of time without injury will increase Outcome: Not Progressing   Problem: Education: Goal: Ability to make informed decisions regarding treatment will improve Outcome: Not Progressing   Problem: Health Behavior/Discharge Planning: Goal: Identification of resources available to assist in meeting health care needs will improve Outcome: Not Progressing   Problem: Self-Concept: Goal: Ability to disclose and discuss suicidal ideas will improve Outcome: Not Progressing Goal: Will verbalize positive feelings about self Outcome: Not Progressing   Problem: Self-Concept: Goal: Ability to identify factors that promote anxiety will improve Outcome: Not Progressing Goal: Level of anxiety will decrease Outcome: Not Progressing Goal: Ability to modify response to factors that promote anxiety will improve Outcome: Not Progressing

## 2023-01-24 NOTE — Tx Team (Signed)
Initial Treatment Plan 01/24/2023 6:50 PM [name] ZOX:[id number]    PATIENT STRESSORS: Financial difficulties   Marital or family conflict     PATIENT STRENGTHS: Forensic psychologist fund of knowledge  Motivation for treatment/growth    PATIENT IDENTIFIED PROBLEMS: SI by OD prior to admission  Marital issues  Anxiety                 DISCHARGE CRITERIA:  Ability to meet basic life and health needs Improved stabilization in mood, thinking, and/or behavior Need for constant or close observation no longer present Reduction of life-threatening or endangering symptoms to within safe limits  PRELIMINARY DISCHARGE PLAN: Outpatient therapy Placement in alternative living arrangements  PATIENT/FAMILY INVOLVEMENT: This treatment plan has been presented to and reviewed with the patient, Melanie Bell. The patient has been given the opportunity to ask questions and make suggestions.  [name], RN 01/24/2023, 6:50 PM

## 2023-01-24 NOTE — Plan of Care (Signed)
New admission.  Problem: Education: Goal: Knowledge of General Education information will improve Description: Including pain rating scale, medication(s)/side effects and non-pharmacologic comfort measures Outcome: Not Progressing   Problem: Health Behavior/Discharge Planning: Goal: Ability to manage health-related needs will improve Outcome: Not Progressing   Problem: Clinical Measurements: Goal: Ability to maintain clinical measurements within normal limits will improve Outcome: Not Progressing Goal: Will remain free from infection Outcome: Not Progressing Goal: Diagnostic test results will improve Outcome: Not Progressing Goal: Respiratory complications will improve Outcome: Not Progressing Goal: Cardiovascular complication will be avoided Outcome: Not Progressing   Problem: Activity: Goal: Risk for activity intolerance will decrease Outcome: Not Progressing   Problem: Nutrition: Goal: Adequate nutrition will be maintained Outcome: Not Progressing   Problem: Coping: Goal: Level of anxiety will decrease Outcome: Not Progressing   Problem: Elimination: Goal: Will not experience complications related to bowel motility Outcome: Not Progressing Goal: Will not experience complications related to urinary retention Outcome: Not Progressing   Problem: Pain Managment: Goal: General experience of comfort will improve Outcome: Not Progressing   Problem: Safety: Goal: Ability to remain free from injury will improve Outcome: Not Progressing   Problem: Skin Integrity: Goal: Risk for impaired skin integrity will decrease Outcome: Not Progressing   Problem: Education: Goal: Knowledge of [place] [facility] Education information/materials will improve Outcome: Not Progressing Goal: Emotional status will improve Outcome: Not Progressing Goal: Mental status will improve Outcome: Not Progressing Goal: Verbalization of understanding the information provided will  improve Outcome: Not Progressing   Problem: Activity: Goal: Interest or engagement in activities will improve Outcome: Not Progressing Goal: Sleeping patterns will improve Outcome: Not Progressing   Problem: Coping: Goal: Ability to verbalize frustrations and anger appropriately will improve Outcome: Not Progressing Goal: Ability to demonstrate self-control will improve Outcome: Not Progressing   Problem: Health Behavior/Discharge Planning: Goal: Identification of resources available to assist in meeting health care needs will improve Outcome: Not Progressing Goal: Compliance with treatment plan for underlying cause of condition will improve Outcome: Not Progressing   Problem: Physical Regulation: Goal: Ability to maintain clinical measurements within normal limits will improve Outcome: Not Progressing   Problem: Safety: Goal: Periods of time without injury will increase Outcome: Not Progressing   Problem: Education: Goal: Ability to make informed decisions regarding treatment will improve Outcome: Not Progressing   Problem: Health Behavior/Discharge Planning: Goal: Identification of resources available to assist in meeting health care needs will improve Outcome: Not Progressing   Problem: Self-Concept: Goal: Ability to disclose and discuss suicidal ideas will improve Outcome: Not Progressing Goal: Will verbalize positive feelings about self Outcome: Not Progressing   Problem: Self-Concept: Goal: Ability to identify factors that promote anxiety will improve Outcome: Not Progressing Goal: Level of anxiety will decrease Outcome: Not Progressing Goal: Ability to modify response to factors that promote anxiety will improve Outcome: Not Progressing

## 2023-01-24 NOTE — Discharge Summary (Signed)
Physician Discharge Summary   Patient: Melanie Bell MRN: [id number] DOB: 1990/11/30  Admit date:     01/20/2023  Discharge date: 01/24/2023  Discharge Physician: Pennie Banter   PCP: [name], MD   Recommendations at discharge:   Follow  up Behavioral Health Follow up with Primary Care  Discharge Diagnoses: Principal Problem:   Drug overdose, multiple drugs, intentional self-harm, initial encounter (HCC) Active Problems:   Migraine without aura and without status migrainosus, not intractable   Hypothyroidism   Mild intermittent asthma   Insomnia  Resolved Problems:   Schizoaffective disorder, bipolar type [facility])  Hospital Course: HPI on admission 01/21/23 AM: "Melanie Bell is a 32 y.o. female with medical history significant for asthma, depression, osteoarthritis, anxiety, schizoaffective bipolar disorder, who presented to the emergency room with acute onset of suicidal/intentional multiple drug overdose including Abilify, Dostinex, Lexapro, Lasix, Synthroid, lithium carbonate, potassium chloride and Topamax.  She took 350 mg of p.o. Topamax 70 mg of potassium chloride and 4200 mg of lithium carbonate as well as 525 mcg of levothyroxine 140 mg of I p.o. Lasix and 70 mg of p.o. to Lexapro and 0.5 mg of p.o. Dostinex with 35 mg of p.o. Abilify.   Initially female was unremarkable and later on elevated.  Possible control recommended supportive treatment with IV fluids and monitor BMP and renal functions as well as EKG.  The patient has been having mild nausea and vomiting yesterday without diarrhea.  She stated that she has been overwhelmed and under a lot of stress lately.  She has been feeling thirsty. ..."  ED course -- stable vitals other than RR 24.   Labs - CMP unremarkable, CBC with WBC 11.1. Normal tylenol and salicylate levels Initial lithium level normal, repeat elevated. EKG sinus tachycardia HR 102. Poison control was contacted, recommended admission  for telemetry monitoring and IV fluids, monitor lithium levels.  Patient was evaluated by psychiatry and recommended for inpatient psych admission once medically clear.  01/22/23 - lithium level normalized.  Pt vomiting this AM after breakfast.   01/23/23 - pt stable from medical standpoint.  Awaiting [facility]/psych admission.    01/24/23 -- pt transferred to behavioral health unit this AM.  Medically stable.   Assessment and Plan: * Drug overdose, multiple drugs, intentional self-harm, initial encounter [facility]) --Psychiatry consulted on admission - recommendation is inpatient psych admission once medically clear --Continue suicide precautions with sitter --Daily contact by psychiatry team for ongoing assessment --Resumption of psych meds per psychiatry ----Abilify resumed 9/8 ----Holding lithium, Lexapro  Insomnia Resume home trazodone PRN  Mild intermittent asthma Continue albuterol PRN  Hypothyroidism Continue Synthroid  Migraine without aura and without status migrainosus, not intractable Continue Imitrex PRN  Schizoaffective disorder, bipolar type (HCC)-resolved as of 01/30/2023 - Psychiatry is following - defer to their recommendations for medications --Resumed on Abilify 9/8 --Lithium & Lexapro on hold         Consultants: Psychiatry Procedures performed: none Disposition:  Inpatient Behavioral Health Diet recommendation:  Regular diet DISCHARGE MEDICATION: Allergies as of 01/24/2023       Reactions   Betadine [povidone Iodine] Hives   Fish Allergy Hives   Shellfish-derived Products Hives        Medication List     STOP taking these medications    escitalopram 10 MG tablet Commonly known as: LEXAPRO   lithium carbonate 300 MG capsule   potassium chloride 10 MEQ tablet Commonly known as: Jerene Dilling  TAKE these medications    albuterol 108 (90 Base) MCG/ACT inhaler Commonly known as: VENTOLIN HFA 2 puffs four times a day as needed What  changed:  how much to take how to take this when to take this reasons to take this additional instructions   cabergoline 0.5 MG tablet Commonly known as: DOSTINEX Take 1 tablet (0.5 mg total) by mouth once a week.   levothyroxine 75 MCG tablet Commonly known as: SYNTHROID Take 1 tablet (75 mcg total) by mouth daily before breakfast.   Magnesium 500 MG Caps Take 500 mg by mouth at bedtime.   omeprazole 20 MG capsule Commonly known as: PRILOSEC TAKE 1 CAPSULE (20 MG TOTAL) BY MOUTH DAILY.   ondansetron 4 MG disintegrating tablet Commonly known as: ZOFRAN-ODT Take 1 tablet (4 mg total) by mouth every 8 (eight) hours as needed. What changed:  when to take this reasons to take this   VITAMIN B-12 PO Take 1 capsule by mouth daily.        Discharge Exam: Filed Weights   01/20/23 1858  Weight: 122.5 kg   General exam: awake, alert, no acute distress, obese Respiratory system: on room air, normal respiratory effort. Cardiovascular system: RRR, no pedal edema.   Central nervous system: A&O x 4. no gross focal neurologic deficits, normal speech Extremities: moves all, normal tone Skin: dry, intact, no rashes seen Psychiatry: normal mood, congruent affect, judgement and insight appear normal   Condition at discharge: stable  The results of significant diagnostics from this hospitalization (including imaging, microbiology, ancillary and laboratory) are listed below for reference.   Imaging Studies: Overnight EEG with video  Result Date: 01/17/2023 Charlsie Quest, MD     01/17/2023 10:15 AM Patient [name] MRN: [id number] Epilepsy Attending: Charlsie Quest Referring Physician/Provider: Charlsie Quest, MD Duration: 01/16/2023 4098 to 01/17/2023 1191 Patient history: 32yo F getting eeg to evaluate for seizure like activity. Level of alertness: Awake, asleep AEDs during EEG study: TPM Technical aspects: This EEG study was done with scalp electrodes positioned  according to the 10-20 International system of electrode placement. Electrical activity was reviewed with band pass filter of 1-70Hz , sensitivity of 7 uV/mm, display speed of 35mm/sec with a 60Hz  notched filter applied as appropriate. EEG data were recorded continuously and digitally stored.  Video monitoring was available and reviewed as appropriate. Description: The posterior dominant rhythm consists of 10 Hz activity of moderate voltage (25-35 uV) seen predominantly in posterior head regions, symmetric and reactive to eye opening and eye closing. Sleep was characterized by vertex waves, sleep spindles (12 to 14 Hz), maximal frontocentral region. Hyperventilation and photic stimulation were not performed.   IMPRESSION: This study is within normal limits. No seizures or epileptiform discharges were seen throughout the recording. A normal interictal EEG does not exclude the diagnosis of epilepsy. Charlsie Quest    Microbiology: Results for orders placed or performed during the hospital encounter of 12/14/22  SARS Coronavirus 2 by RT PCR (hospital order, performed in [facility] hospital lab) *cepheid single result test* Anterior Nasal Swab     Status: Abnormal   Collection Time: 12/14/22  9:14 AM   Specimen: Anterior Nasal Swab  Result Value Ref Range Status   SARS Coronavirus 2 by RT PCR POSITIVE (A) NEGATIVE Final    Comment: (NOTE) SARS-CoV-2 target nucleic acids are DETECTED  SARS-CoV-2 RNA is generally detectable in upper respiratory specimens  during the acute phase of infection.  Positive results are indicative  of the presence of the identified virus, but do not rule out bacterial infection or co-infection with other pathogens not detected by the test.  Clinical correlation with patient history and  other diagnostic information is necessary to determine patient infection status.  The expected result is negative.  Fact Sheet for Patients:    RoadLapTop.co.za   Fact Sheet for Healthcare Providers:   http://kim-miller.com/    This test is not yet approved or cleared by the [place] FDA and  has been authorized for detection and/or diagnosis of SARS-CoV-2 by FDA under an Emergency Use Authorization (EUA).  This EUA will remain in effect (meaning this test can be used) for the duration of  the COVID-19 declaration under Section 564(b)(1)  of the Act, 21 U.S.C. section 360-bbb-3(b)(1), unless the authorization is terminated or revoked sooner.   Performed at [facility], [address], [place], [place] [address]     Labs: CBC: No results for input(s): "WBC", "NEUTROABS", "HGB", "HCT", "MCV", "PLT" in the last 168 hours.  Basic Metabolic Panel: No results for input(s): "NA", "K", "CL", "CO2", "GLUCOSE", "BUN", "CREATININE", "CALCIUM", "MG", "PHOS" in the last 168 hours.  Liver Function Tests: No results for input(s): "AST", "ALT", "ALKPHOS", "BILITOT", "PROT", "ALBUMIN" in the last 168 hours.  CBG: No results for input(s): "GLUCAP" in the last 168 hours.   Discharge time spent: less than 30 minutes.  Signed: Pennie Banter, DO [facility] 01/31/2023

## 2023-01-25 DIAGNOSIS — T1491XA Suicide attempt, initial encounter: Secondary | ICD-10-CM

## 2023-01-25 MED ORDER — HYDROCERIN EX CREA: TOPICAL_CREAM | Freq: Two times a day (BID) | CUTANEOUS | Status: DC | PRN

## 2023-01-25 MED ORDER — LEVOTHYROXINE SODIUM 50 MCG PO TABS
75.0000 ug | ORAL_TABLET | Freq: Every day | ORAL | Status: DC
  Administered 2023-01-26 – 2023-01-30 (×5): 75 ug via ORAL
  Filled 2023-01-25 (×5): qty 2
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
## 2023-01-25 NOTE — Group Note (Addendum)
LCSW Group Therapy Note  Group Date: 01/25/2023 Start Time: 1330 End Time: 1430   Type of Therapy and Topic:  Group Therapy: Anger Cues and Responses  Participation Level:  Active   Description of Group:   In this group, patients learned how to recognize the physical, cognitive, emotional, and behavioral responses they have to anger-provoking situations.  They identified a recent time they became angry and how they reacted.  They analyzed how their reaction was possibly beneficial and how it was possibly unhelpful.  The group discussed a variety of healthier coping skills that could help with such a situation in the future.  Focus was placed on how helpful it is to recognize the underlying emotions to our anger, because working on those can lead to a more permanent solution as well as our ability to focus on the important rather than the urgent.  Therapeutic Goals: Patients will remember their last incident of anger and how they felt emotionally and physically, what their thoughts were at the time, and how they behaved. Patients will identify how their behavior at that time worked for them, as well as how it worked against them. Patients will explore possible new behaviors to use in future anger situations. Patients will learn that anger itself is normal and cannot be eliminated, and that healthier reactions can assist with resolving conflict rather than worsening situations.  Summary of Patient Progress:   Patient was active during the group. She engaged in discussion on triggers and how they can affect people.  She was able to engage in discussion on coping skills for anger.  She was appropriate and supportive of others.  She demonstrated fair insight into the subject matter, was respectful of peers, and participated throughout the entire session.  Therapeutic Modalities:   Cognitive Behavioral Therapy    Harden Mo, LCSW 01/25/2023  3:38 PM

## 2023-01-25 NOTE — Group Note (Signed)
Date:  01/25/2023 Time:  5:24 PM  Group Topic/Focus:  Outdoor Recreation    Participation Level:  Active  Participation Quality:  Appropriate  Affect:  Appropriate  Cognitive:  Appropriate  Insight: Appropriate  Engagement in Group:  Engaged  Modes of Intervention:  Activity  Additional Comments:    Wilford Corner 01/25/2023, 5:24 PM

## 2023-01-25 NOTE — Group Note (Signed)
Recreation Therapy Group Note   Group Topic:Healthy Support Systems  Group Date: 01/25/2023 Start Time: 1000 End Time: 1100 Facilitators: Rosina Lowenstein, LRT, CTRS Location:  Craft Room  Group Description: Straw Bridge.  Patients were given 10 plastic drinking straws and an equal length of masking tape. Using the materials provided, patients were instructed to build a free-standing bridge-like structure to suspend an everyday item (ex: deck of cards) off the floor or table surface. All materials were required to be used in Secondary school teacher. LRT facilitated post-activity discussion reviewing the importance of having strong and healthy support systems in our lives. LRT discussed how the people in our lives serve as the tape and the deck of cards we placed on top of our straw structure are the stressors we face in daily life. LRT and pts discussed what happens in our life when things get too heavy for [name], and we don't have strong supports outside of the hospital. Pt shared 2 of their healthy supports in their life aloud in the group.    Goal Area(s) Addressed:  Patient will identify 2 healthy supports in their life. Patient will identify skills to successfully complete activity. Patient will identify correlation of this activity to life post-discharge.  Patient will work on Product manager.   Affect/Mood: Appropriate   Participation Level: Active and Engaged   Participation Quality: Independent   Behavior: Appropriate, Calm, and Cooperative   Speech/Thought Process: Coherent   Insight: Good   Judgement: Good   Modes of Intervention: Activity   Patient Response to Interventions:  Attentive, Engaged, Interested , and Receptive   Education Outcome:  Acknowledges education   Clinical Observations/Individualized Feedback: Sammara was active in their participation of session activities and group discussion. Pt identified "my friend, Gearldine Bienenstock and my mom. I also like to crochet  as a coping skill" as her healthy supports. Pt interacted well with LRT and peers duration of session.   Plan: Continue to engage patient in RT group sessions 2-3x/week.   Rosina Lowenstein, LRT, CTRS 01/25/2023 11:11 AM

## 2023-01-25 NOTE — Plan of Care (Signed)
Assumed care of patient this am, she presented with a depressed affect and mood, she begun to cry after talking about her reason for been here for tx. Pt focused on finding housing due separation from husband. _ Pt was tearful throughout most of the day, she attended groups and was compliant with taking her medications. Pt denied having thoughts, plan or intent to harm herself or others, Will continue to monitor for safety and follow other plan of care.   Problem: Education: Goal: Knowledge of General Education information will improve Description: Including pain rating scale, medication(s)/side effects and non-pharmacologic comfort measures Outcome: Progressing   Problem: Clinical Measurements: Goal: Ability to maintain clinical measurements within normal limits will improve Outcome: Progressing Goal: Will remain free from infection Outcome: Not Applicable Goal: Diagnostic test results will improve Outcome: Not Applicable Goal: Respiratory complications will improve Outcome: Not Applicable   Problem: Elimination: Goal: Will not experience complications related to urinary retention Outcome: Not Applicable

## 2023-01-25 NOTE — BH IP Treatment Plan (Signed)
Interdisciplinary Treatment and Diagnostic Plan Update  01/25/2023 Time of Session: 09:16 Melanie Bell MRN: [id number]  Principal Diagnosis: Drug overdose, intentional self-harm, initial encounter [facility])  Secondary Diagnoses: Principal Problem:   Drug overdose, intentional self-harm, initial encounter (HCC)   Current Medications:  Current Facility-Administered Medications  Medication Dose Route Frequency Provider Last Rate Last Admin   acetaminophen (TYLENOL) tablet 650 mg  650 mg Oral Q6H PRN Bennett, Christal H, NP       albuterol (VENTOLIN HFA) 108 (90 Base) MCG/ACT inhaler 2 puff  2 puff Inhalation Q6H PRN Bennett, Christal H, NP       alum & mag hydroxide-simeth (MAALOX/MYLANTA) 200-200-20 MG/5ML suspension 15 mL  15 mL Oral Q4H PRN Bennett, Christal H, NP       ARIPiprazole (ABILIFY) tablet 5 mg  5 mg Oral Daily Bennett, Christal H, NP   5 mg at 01/25/23 4098   diphenhydrAMINE (BENADRYL) capsule 50 mg  50 mg Oral BID PRN Bennett, Christal H, NP       Or   diphenhydrAMINE (BENADRYL) injection 50 mg  50 mg Intramuscular BID PRN Bennett, Christal H, NP       furosemide (LASIX) tablet 20 mg  20 mg Oral Daily Bennett, Christal H, NP   20 mg at 01/25/23 0842   haloperidol (HALDOL) tablet 5 mg  5 mg Oral BID PRN Bennett, Christal H, NP       Or   haloperidol lactate (HALDOL) injection 5 mg  5 mg Intramuscular BID PRN Bennett, Christal H, NP       hydrOXYzine (ATARAX) tablet 50 mg  50 mg Oral TID PRN Bennett, Christal H, NP   50 mg at 01/24/23 1804   LORazepam (ATIVAN) tablet 2 mg  2 mg Oral BID PRN Bennett, Christal H, NP       Or   LORazepam (ATIVAN) injection 2 mg  2 mg Intramuscular BID PRN Bennett, Christal H, NP       magnesium hydroxide (MILK OF MAGNESIA) suspension 30 mL  30 mL Oral Daily PRN Bennett, Christal H, NP       pantoprazole (PROTONIX) EC tablet 40 mg  40 mg Oral Daily Bennett, Christal H, NP   40 mg at 01/25/23 0842   polyethylene glycol (MIRALAX / GLYCOLAX)  packet 17 g  17 g Oral Daily Bennett, Christal H, NP   17 g at 01/25/23 0842   traZODone (DESYREL) tablet 50 mg  50 mg Oral QHS PRN Willeen Cass, Christal H, NP   50 mg at 01/24/23 2110   vitamin B-12 (CYANOCOBALAMIN) tablet 100 mcg  100 mcg Oral Daily Bennett, Christal H, NP   100 mcg at 01/25/23 1191   PTA Medications: Medications Prior to Admission  Medication Sig Dispense Refill Last Dose   albuterol (VENTOLIN HFA) 108 (90 Base) MCG/ACT inhaler 2 puffs four times a day as needed (Patient taking differently: Inhale 2 puffs into the lungs every 8 (eight) hours as needed for wheezing or shortness of breath.) 18 g 1    ARIPiprazole (ABILIFY) 5 MG tablet Take 1 tablet (5 mg total) by mouth daily. 30 tablet 3    cabergoline (DOSTINEX) 0.5 MG tablet Take 1 tablet (0.5 mg total) by mouth once a week. 12 tablet 3    Cyanocobalamin (VITAMIN B-12 PO) Take 1 capsule by mouth daily.      furosemide (LASIX) 20 MG tablet Take 1 tablet (20 mg total) by mouth daily. 30 tablet 3    levothyroxine (SYNTHROID)  75 MCG tablet Take 1 tablet (75 mcg total) by mouth daily before breakfast. 90 tablet 3    Magnesium 500 MG CAPS Take 500 mg by mouth at bedtime.      omeprazole (PRILOSEC) 20 MG capsule TAKE 1 CAPSULE (20 MG TOTAL) BY MOUTH DAILY. 90 capsule 2    ondansetron (ZOFRAN-ODT) 4 MG disintegrating tablet Take 1 tablet (4 mg total) by mouth every 8 (eight) hours as needed. (Patient taking differently: Take 4 mg by mouth as needed for nausea or vomiting.) 20 tablet 0    SUMAtriptan (IMITREX) 20 MG/ACT nasal spray Place 20 mg into the nose as needed for migraine or headache.      SUMAtriptan (IMITREX) 25 MG tablet Take 1 tablet (25 mg total) by mouth every 2 (two) hours as needed for migraine or headache. May repeat in 2 hours if headache persists or recurs.      traZODone (DESYREL) 50 MG tablet Take 1 tablet (50 mg total) by mouth at bedtime as needed for sleep. 30 tablet 3     Patient Stressors: Financial difficulties    Marital or family conflict    Patient Strengths: Careers information officer for treatment/growth   Treatment Modalities: Medication Management, Group therapy, Case management,  1 to 1 session with clinician, Psychoeducation, Recreational therapy.   Physician Treatment Plan for Primary Diagnosis: Drug overdose, intentional self-harm, initial encounter (HCC) Long Term Goal(s):     Short Term Goals:    Medication Management: Evaluate patient's response, side effects, and tolerance of medication regimen.  Therapeutic Interventions: 1 to 1 sessions, Unit Group sessions and Medication administration.  Evaluation of Outcomes: Not Met  Physician Treatment Plan for Secondary Diagnosis: Principal Problem:   Drug overdose, intentional self-harm, initial encounter (HCC)  Long Term Goal(s):     Short Term Goals:       Medication Management: Evaluate patient's response, side effects, and tolerance of medication regimen.  Therapeutic Interventions: 1 to 1 sessions, Unit Group sessions and Medication administration.  Evaluation of Outcomes: Not Met   RN Treatment Plan for Primary Diagnosis: Drug overdose, intentional self-harm, initial encounter (HCC) Long Term Goal(s): Knowledge of disease and therapeutic regimen to maintain health will improve  Short Term Goals: Ability to remain free from injury will improve, Ability to verbalize frustration and anger appropriately will improve, Ability to demonstrate self-control, Ability to participate in decision making will improve, Ability to verbalize feelings will improve, Ability to disclose and discuss suicidal ideas, Ability to identify and develop effective coping behaviors will improve, and Compliance with prescribed medications will improve  Medication Management: RN will administer medications as ordered by provider, will assess and evaluate patient's response and provide education to patient for prescribed  medication. RN will report any adverse and/or side effects to prescribing provider.  Therapeutic Interventions: 1 on 1 counseling sessions, Psychoeducation, Medication administration, Evaluate responses to treatment, Monitor vital signs and CBGs as ordered, Perform/monitor CIWA, COWS, AIMS and Fall Risk screenings as ordered, Perform wound care treatments as ordered.  Evaluation of Outcomes: Not Met   LCSW Treatment Plan for Primary Diagnosis: Drug overdose, intentional self-harm, initial encounter [facility] Long Term Goal(s): Safe transition to appropriate next level of care at discharge, Engage patient in therapeutic group addressing interpersonal concerns.  Short Term Goals: Engage patient in aftercare planning with referrals and resources, Increase social support, Increase ability to appropriately verbalize feelings, Increase emotional regulation, Facilitate acceptance of mental health diagnosis and concerns, and Increase skills  for wellness and recovery  Therapeutic Interventions: Assess for all discharge needs, 1 to 1 time with Social worker, Explore available resources and support systems, Assess for adequacy in community support network, Educate family and significant other(s) on suicide prevention, Complete Psychosocial Assessment, Interpersonal group therapy.  Evaluation of Outcomes: Not Met   Progress in Treatment: Attending groups: Yes. Participating in groups: Yes. Taking medication as prescribed: Yes. Toleration medication: Yes. Family/Significant other contact made: No, will contact:  when given permission. Patient understands diagnosis: Yes. Discussing patient identified problems/goals with staff: Yes. Medical problems stabilized or resolved: Yes. Denies suicidal/homicidal ideation: Yes. Issues/concerns per patient self-inventory: No. Other: none.  New problem(s) identified: No, Describe:  none identified.   New Short Term/Long Term Goal(s): medication management for mood  stabilization; elimination of SI thoughts; development of comprehensive mental wellness plan.   Patient Goals:  "I just want to get myself together so I can heal and move on."   Discharge Plan or Barriers: CSW will assist pt with development of an appropriate aftercare/discharge plan.   Reason for Continuation of Hospitalization: Depression Medication stabilization Suicidal ideation  Estimated Length of Stay: 1-7 days  Last 3 [place] Suicide Severity Risk Score: Flowsheet Row Admission (Current) from 01/24/2023 in [facility] ED to Hosp-Admission (Discharged) from 01/20/2023 in [facility] (1A) Admission (Discharged) from 01/16/2023 in [place] [facility]  C-SSRS RISK CATEGORY Error: Q3, 4, or 5 should not be populated when Q2 is No High Risk No Risk       Last PHQ 2/9 Scores:    01/11/2023    1:13 PM 06/20/2022    1:51 PM 01/12/2022    3:00 PM  Depression screen PHQ 2/9  Decreased Interest 0 0 0  Down, Depressed, Hopeless 0 0 0  PHQ - 2 Score 0 0 0  Altered sleeping 0 1 1  Tired, decreased energy 0 0 1  Change in appetite 0 1 0  Feeling bad or failure about yourself  0 0 0  Trouble concentrating 0 0 0  Moving slowly or fidgety/restless 0 0 0  Suicidal thoughts 0 0 0  PHQ-9 Score 0 2 2  Difficult doing work/chores Not difficult at all Not difficult at all Not difficult at all    Scribe for Treatment Team: Glenis Smoker, LCSW 01/25/2023 9:56 AM

## 2023-01-25 NOTE — Progress Notes (Signed)
Suicide Risk Assessment  Admission Assessment    [facility] Admission Suicide Risk Assessment   Nursing information obtained from:  Patient Demographic factors:  Caucasian, Low socioeconomic status, Unemployed Current Mental Status:  NA Loss Factors:  Financial problems / change in socioeconomic status, Loss of significant relationship Historical Factors:  Impulsivity, Prior suicide attempts Risk Reduction Factors:  Positive social support  Total Time spent with patient: 1 hour Principal Problem: Suicide attempt (HCC) Diagnosis:  Principal Problem:   Suicide attempt (HCC) Active Problems:   Schizoaffective disorder, bipolar type (HCC)  Subjective Data: 32 y/o female w/ reported history of bipolar disorder vs schizoaffective disorder, charted history of ptsd, schizoaffective disorder depressive type, mdd, borderline personality disorder admitted to inpatient psychiatry for suicide attempt, overdose on precribed medications, following a verbal altercation with her husband and her in-laws.  Continued Clinical Symptoms:  Alcohol Use Disorder Identification Test Final Score (AUDIT): 0 The "Alcohol Use Disorders Identification Test", Guidelines for Use in Primary Care, Second Edition.  World Science writer [facility]). Score between 0-7:  no or low risk or alcohol related problems. Score between 8-15:  moderate risk of alcohol related problems. Score between 16-19:  high risk of alcohol related problems. Score 20 or above:  warrants further diagnostic evaluation for alcohol dependence and treatment.   CLINICAL FACTORS:   Previous Psychiatric Diagnoses and Treatments   Musculoskeletal: Strength & Muscle Tone: within normal limits Gait & Station: normal Patient leans: N/A  Psychiatric Specialty Exam:  Presentation  General Appearance:  Appropriate for Environment  Eye Contact: Fair  Speech: Clear and Coherent; Normal Rate  Speech Volume: Normal  Handedness: Right   Mood and  Affect  Mood: -- ("ok now")  Affect: Blunt   Thought Process  Thought Processes: Coherent; Goal Directed; Linear  Descriptions of Associations:Intact  Orientation:Full (Time, Place and Person)  Thought Content:Logical  History of Schizophrenia/Schizoaffective disorder:No  Duration of Psychotic Symptoms:No data recorded Hallucinations:Hallucinations: None  Ideas of Reference:None  Suicidal Thoughts:Suicidal Thoughts: No  Homicidal Thoughts:Homicidal Thoughts: No   Sensorium  Memory: Immediate Good; Recent Good; Remote Good  Judgment: Intact  Insight: Present   Executive Functions  Concentration: Good  Attention Span: Good  Recall: Good  Fund of Knowledge: Good  Language: Good   Psychomotor Activity  Psychomotor Activity: Psychomotor Activity: Normal   Assets  Assets: Communication Skills; Desire for Improvement; Financial Resources/Insurance; Resilience   Sleep  Sleep: Sleep: Fair    Physical Exam: Physical Exam see admission ROS see admission Blood pressure 107/61, pulse 64, temperature 98 F (36.7 C), resp. rate 20, height 5\' 8"  (1.727 m), weight 124.3 kg, last menstrual period 01/13/2023, SpO2 99%, unknown if currently breastfeeding. Body mass index is 41.66 kg/m.   COGNITIVE FEATURES THAT CONTRIBUTE TO RISK:  None    SUICIDE RISK:  Moderate  PLAN OF CARE:  Daily contact with patient to assess and evaluate symptoms and progress in treatment Medication management  I certify that inpatient services furnished can reasonably be expected to improve the patient's condition.   Lauree Chandler, NP 01/25/2023, 4:50 PM

## 2023-01-25 NOTE — Progress Notes (Signed)
Pt calm and pleasant during assessment denying SI/HI/AVH. Pt stated she was having a hard time because her husband wants to file for divorce. Pt given support. Pt compliant with medication administration per MD orders. Pt given education, support, and encouragement to be active in her treatment plan. Pt being monitored Q 15 minutes for safety per unit protocol, remains safe on the unit

## 2023-01-25 NOTE — Group Note (Signed)
Date:  01/25/2023 Time:  8:38 PM  Group Topic/Focus:  Goals Group:   The focus of this group is to help patients establish daily goals to achieve during treatment and discuss how the patient can incorporate goal setting into their daily lives to aide in recovery.    Participation Level:  Active  Participation Quality:  Appropriate and Attentive  Affect:  Appropriate  Cognitive:  Alert and Appropriate  Insight: Appropriate and Good  Engagement in Group:  Developing/Improving and Engaged  Modes of Intervention:  Discussion, Rapport Building, Socialization, and Support  Additional Comments:     Melanie Bell 01/25/2023, 8:38 PM

## 2023-01-25 NOTE — [facility] Suicide Risk Assessment (Signed)
[facility] INPATIENT:  Family/Significant Other Suicide Prevention Education  Suicide Prevention Education:  Patient Refusal for Family/Significant Other Suicide Prevention Education: The patient Melanie Bell has refused to provide written consent for family/significant other to be provided Family/Significant Other Suicide Prevention Education during admission and/or prior to discharge.  Physician notified.  SPE completed with pt, as pt refused to consent to family contact. SPI pamphlet provided to pt and pt was encouraged to share information with support network, ask questions, and talk about any concerns relating to SPE. Pt denies access to guns/firearms and verbalized understanding of information provided. Mobile Crisis information also provided to pt.  Glenis Smoker 01/25/2023, 4:06 PM

## 2023-01-26 ENCOUNTER — Ambulatory Visit: Payer: Medicaid Other

## 2023-01-26 LAB — LIPID PANEL
Cholesterol: 153 mg/dL (ref 0–200)
HDL: 40 mg/dL — ABNORMAL LOW (ref >40)
LDL Cholesterol: 103 mg/dL — ABNORMAL HIGH (ref 0–99)
Total CHOL/HDL Ratio: 3.8 ratio
Triglycerides: 48 mg/dL (ref <150)
VLDL: 10 mg/dL (ref 0–40)

## 2023-01-26 MED ORDER — IBUPROFEN 200 MG PO TABS
400.0000 mg | ORAL_TABLET | Freq: Four times a day (QID) | ORAL | Status: DC | PRN
  Administered 2023-01-26 – 2023-01-27 (×2): 400 mg via ORAL
  Filled 2023-01-26 (×2): qty 2

## 2023-01-26 NOTE — Plan of Care (Signed)
Assumed care of patient this am, she presented with a depressed affect and mood,  Pt  continue to focus on finding housing and sadness around her separation from husband and stepson, and c/o R knee pain and swelling.. Tearful at times most of the day, she attended groups and was compliant with taking her medications. Pt denied having thoughts, plan or intent to harm herself or others, Will continue to monitor for safety and follow other plan of care.     Problem: Education: Goal: Knowledge of General Education information will improve Description: Including pain rating scale, medication(s)/side effects and non-pharmacologic comfort measures Outcome: Progressing   Problem: Clinical Measurements: Goal: Ability to maintain clinical measurements within normal limits will improve Outcome: Progressing Goal: Will remain free from infection Outcome: Not Applicable Goal: Diagnostic test results will improve Outcome: Not Applicable Goal: Respiratory complications will improve Outcome: Not Applicable   Problem: Elimination: Goal: Will not experience complications related to urinary retention Outcome: Not Applicable

## 2023-01-26 NOTE — Group Note (Signed)
Date:  01/26/2023 Time:  10:30 PM  Group Topic/Focus:  Overcoming Stress:   The focus of this group is to define stress and help patients assess their triggers.    Participation Level:  Active  Participation Quality:  Appropriate and Attentive  Affect:  Appropriate  Cognitive:  Alert and Appropriate  Insight: Appropriate and Good  Engagement in Group:  Developing/Improving  Modes of Intervention:  Education and Limit-setting  Additional Comments:     Linnette Panella 01/26/2023, 10:30 PM

## 2023-01-26 NOTE — Progress Notes (Signed)
[facility] MD Progress Note  01/26/2023 2:11 PM Melanie Bell  MRN:  [id number]  Subjective:  Pt chart reviewed, discussed with interdisciplinary team, and seen on rounds. Endorses mood is "ok, I'm feeling better". Denies suicidal, homicidal ideations. Denies auditory visual hallucinations or paranoia. She feels her appetite and sleep are fair. She states she feels her anxiety and depression is manageable. She states she learned relaxation technique in group today which has been helpful. She has complaint of right knee pain. Reports history of rheumatoid and osteoarthritis. She states she also hit her knee after arguing with her in laws and husband. Didn't find acetaminophen helpful. Ibuprofen ordered. We discussed medication changes and she prefers to stay on abilify at this time.  Principal Problem: Suicide attempt [facility])  Diagnosis: Principal Problem:   Suicide attempt [facility]) Active Problems:   Schizoaffective disorder, bipolar type (HCC)  Total Time spent with patient:  25 minutes  Past Psychiatric History: Reported history of bipolar disorder vs schizoaffective disorder  Past Medical History:  Past Medical History:  Diagnosis Date   Allergy    Anxiety    Arthritis    Asthma    Depression    Foreign body aspiration    GERD (gastroesophageal reflux disease)    Hallucinations 09/30/2014   Sizoaffective   Hyperlipidemia    Hypertension    Hypoglycemia    Intentional ingestion of batteries 04/21/2016    2 AAA batteries and 1 thumb tack; intent to hurt herself/notes 04/21/2016   Seizure (HCC) 01/16/2023   Self-inflicted injury 11/23/2017   Suicidal ideation 01/02/2018   Tardive dyskinesia 10/2014   recent onset   Thyroid disease     Past Surgical History:  Procedure Laterality Date   ABDOMINAL SURGERY     "years ago" to remove foreign objects   APPENDECTOMY     BREAST EXCISIONAL BIOPSY Right 09/03/2007   surgical bx removed fibroadeoma   BREAST LUMPECTOMY Right     CHOLECYSTECTOMY  02/2022   COLONOSCOPY WITH PROPOFOL N/A 09/10/2015   Procedure: COLONOSCOPY WITH PROPOFOL;  Surgeon: Christena Deem, MD;  Location: [facility] ENDOSCOPY;  Service: Endoscopy;  Laterality: N/A;   ESOPHAGOGASTRODUODENOSCOPY N/A 11/28/2014   Procedure: ESOPHAGOGASTRODUODENOSCOPY (EGD);  Surgeon: Scot Jun, MD;  Location: [facility] ENDOSCOPY;  Service: Endoscopy;  Laterality: N/A;   ESOPHAGOGASTRODUODENOSCOPY N/A 02/21/2016   Procedure: ESOPHAGOGASTRODUODENOSCOPY (EGD);  Surgeon: Napoleon Form, MD;  Location: [facility] ENDOSCOPY;  Service: Endoscopy;  Laterality: N/A;   ESOPHAGOGASTRODUODENOSCOPY N/A 04/21/2016   Procedure: ESOPHAGOGASTRODUODENOSCOPY (EGD);  Surgeon: Iva Boop, MD;  Location: [facility] ENDOSCOPY;  Service: Endoscopy;  Laterality: N/A;   ESOPHAGOGASTRODUODENOSCOPY N/A 05/06/2016   Procedure: ESOPHAGOGASTRODUODENOSCOPY (EGD);  Surgeon: Wyline Mood, MD;  Location: [facility] ENDOSCOPY;  Service: Gastroenterology;  Laterality: N/A;   ESOPHAGOGASTRODUODENOSCOPY N/A 06/13/2016   Procedure: ESOPHAGOGASTRODUODENOSCOPY (EGD);  Surgeon: Midge Minium, MD;  Location: [facility] ENDOSCOPY;  Service: Endoscopy;  Laterality: N/A;   ESOPHAGOGASTRODUODENOSCOPY N/A 01/14/2022   Procedure: ESOPHAGOGASTRODUODENOSCOPY (EGD);  Surgeon: Toledo, Boykin Nearing, MD;  Location: [facility] ENDOSCOPY;  Service: Gastroenterology;  Laterality: N/A;   ESOPHAGOGASTRODUODENOSCOPY (EGD) WITH PROPOFOL N/A 02/29/2016   Procedure: ESOPHAGOGASTRODUODENOSCOPY (EGD) WITH PROPOFOL;  Surgeon: Midge Minium, MD;  Location: [facility] ENDOSCOPY;  Service: Endoscopy;  Laterality: N/A;   ESOPHAGOGASTRODUODENOSCOPY (EGD) WITH PROPOFOL N/A 01/15/2018   Procedure: ESOPHAGOGASTRODUODENOSCOPY (EGD) WITH PROPOFOL;  Surgeon: Corbin Ade, MD;  Location: [facility] ENDO SUITE;  Service: Endoscopy;  Laterality: N/A;  retrieval forein body   LAPAROSCOPIC LYSIS OF ADHESIONS  02/16/2022   Procedure: LAPAROSCOPIC LYSIS  OF ADHESIONS;  Surgeon: Henrene Dodge, MD;   Location: [facility] ORS;  Service: General;;   LAPAROTOMY N/A 09/12/2015   Procedure: EXPLORATORY LAPAROTOMY;  Surgeon: Lattie Haw, MD;  Location: [facility] ORS;  Service: General;  Laterality: N/A;   SIGMOIDOSCOPY N/A 09/12/2015   Procedure: Daiva Huge;  Surgeon: Lattie Haw, MD;  Location: [facility] ORS;  Service: General;  Laterality: N/A;   WISDOM TOOTH EXTRACTION     Family History:  Family History  Problem Relation Age of Onset   Depression Mother    Hypertension Mother    Sleep apnea Mother    Asthma Mother    COPD Mother    Diabetes Mother    Anxiety disorder Mother    Arthritis Mother    Heart failure Mother    Breast cancer Maternal Aunt        20's   Stroke Maternal Grandmother    Heart attack Maternal Grandfather    Family Psychiatric  History: Grandmother - schizophrenia, biological father - schizophrenia, mother - depression, anxiety, sister - depression, anxiety, brother - adhd Social History:  Social History   Substance and Sexual Activity  Alcohol Use No     Social History   Substance and Sexual Activity  Drug Use No    Social History   Socioeconomic History   Marital status: Married    Spouse name: Not on file   Number of children: 0   Years of education: 12   Highest education level: Not on file  Occupational History   Occupation: unemployed  Tobacco Use   Smoking status: Former    Current packs/day: 0.00    Average packs/day: 0.5 packs/day for 3.0 years (1.5 ttl pk-yrs)    Types: Cigarettes    Start date: 08/14/2013    Quit date: 08/14/2016    Years since quitting: 6.4    Passive exposure: Past   Smokeless tobacco: Never  Vaping Use   Vaping status: Never Used  Substance and Sexual Activity   Alcohol use: No   Drug use: No   Sexual activity: Yes    Partners: Male    Birth control/protection: None  Other Topics Concern   Not on file  Social History Narrative   Right handed    One story home   Drinks occasional caffeine   Social  Determinants of Health   Financial Resource Strain: Low Risk  (01/11/2023)   Overall Financial Resource Strain (CARDIA)    Difficulty of Paying Living Expenses: Not very hard  Food Insecurity: Food Insecurity Present (01/24/2023)   Hunger Vital Sign    Worried About Running Out of Food in the Last Year: Sometimes true    Ran Out of Food in the Last Year: Sometimes true  Transportation Needs: No Transportation Needs (01/24/2023)   PRAPARE - Administrator, Civil Service (Medical): No    Lack of Transportation (Non-Medical): No  Physical Activity: Sufficiently Active (01/11/2023)   Exercise Vital Sign    Days of Exercise per Week: 5 days    Minutes of Exercise per Session: 30 min  Recent Concern: Physical Activity - Insufficiently Active (12/27/2022)   Exercise Vital Sign    Days of Exercise per Week: 7 days    Minutes of Exercise per Session: 20 min  Stress: No Stress Concern Present (01/11/2023)   [employer] of Occupational Health - Occupational Stress Questionnaire    Feeling of Stress : Not at all  Social Connections: Moderately Integrated (01/11/2023)   Social Connection  and Isolation Panel [NHANES]    Frequency of Communication with Friends and Family: More than three times a week    Frequency of Social Gatherings with Friends and Family: Once a week    Attends Religious Services: More than 4 times per year    Active Member of [employer] or Organizations: No    Attends Engineer, structural: Never    Marital Status: Married   Sleep: Fair  Appetite:  Fair  Current Medications: Current Facility-Administered Medications  Medication Dose Route Frequency Provider Last Rate Last Admin   acetaminophen (TYLENOL) tablet 650 mg  650 mg Oral Q6H PRN Bennett, Christal H, NP   650 mg at 01/26/23 1240   albuterol (VENTOLIN HFA) 108 (90 Base) MCG/ACT inhaler 2 puff  2 puff Inhalation Q6H PRN Bennett, Christal H, NP       alum & mag hydroxide-simeth (MAALOX/MYLANTA)  200-200-20 MG/5ML suspension 15 mL  15 mL Oral Q4H PRN Bennett, Christal H, NP       ARIPiprazole (ABILIFY) tablet 5 mg  5 mg Oral Daily Bennett, Christal H, NP   5 mg at 01/26/23 0855   diphenhydrAMINE (BENADRYL) capsule 50 mg  50 mg Oral BID PRN Bennett, Christal H, NP       Or   diphenhydrAMINE (BENADRYL) injection 50 mg  50 mg Intramuscular BID PRN Bennett, Christal H, NP       furosemide (LASIX) tablet 20 mg  20 mg Oral Daily Bennett, Christal H, NP   20 mg at 01/26/23 0854   haloperidol (HALDOL) tablet 5 mg  5 mg Oral BID PRN Bennett, Christal H, NP       Or   haloperidol lactate (HALDOL) injection 5 mg  5 mg Intramuscular BID PRN Bennett, Christal H, NP       hydrocerin (EUCERIN) cream   Topical BID PRN Lauree Chandler, NP       hydrOXYzine (ATARAX) tablet 50 mg  50 mg Oral TID PRN Willeen Cass, Christal H, NP   50 mg at 01/24/23 1804   ibuprofen (ADVIL) tablet 400 mg  400 mg Oral Q6H PRN Lauree Chandler, NP       levothyroxine (SYNTHROID) tablet 75 mcg  75 mcg Oral QAC breakfast Lauree Chandler, NP   75 mcg at 01/26/23 9518   LORazepam (ATIVAN) tablet 2 mg  2 mg Oral BID PRN Bennett, Christal H, NP       Or   LORazepam (ATIVAN) injection 2 mg  2 mg Intramuscular BID PRN Bennett, Christal H, NP       magnesium hydroxide (MILK OF MAGNESIA) suspension 30 mL  30 mL Oral Daily PRN Bennett, Christal H, NP       pantoprazole (PROTONIX) EC tablet 40 mg  40 mg Oral Daily Bennett, Christal H, NP   40 mg at 01/26/23 0855   polyethylene glycol (MIRALAX / GLYCOLAX) packet 17 g  17 g Oral Daily Bennett, Christal H, NP   17 g at 01/26/23 0854   traZODone (DESYREL) tablet 50 mg  50 mg Oral QHS PRN Bennett, Christal H, NP   50 mg at 01/25/23 2044   vitamin B-12 (CYANOCOBALAMIN) tablet 100 mcg  100 mcg Oral Daily Bennett, Christal H, NP   100 mcg at 01/26/23 8416    Lab Results:  Results for orders placed or performed during the hospital encounter of 01/24/23 (from the past 48 hour(s))  Lipid  panel     Status: Abnormal   Collection Time:  01/26/23  6:45 AM  Result Value Ref Range   Cholesterol 153 0 - 200 mg/dL   Triglycerides 48 <409 mg/dL   HDL 40 (L) >81 mg/dL   Total CHOL/HDL Ratio 3.8 RATIO   VLDL 10 0 - 40 mg/dL   LDL Cholesterol 191 (H) 0 - 99 mg/dL    Comment:        Total Cholesterol/HDL:CHD Risk Coronary Heart Disease Risk Table                     Men   Women  1/2 Average Risk   3.4   3.3  Average Risk       5.0   4.4  2 X Average Risk   9.6   7.1  3 X Average Risk  23.4   11.0        Use the calculated Patient Ratio above and the CHD Risk Table to determine the patient's CHD Risk.        ATP III CLASSIFICATION (LDL):  <100     mg/dL   Optimal  478-295  mg/dL   Near or Above                    Optimal  130-159  mg/dL   Borderline  621-308  mg/dL   High  >657     mg/dL   Very High Performed at [facility], [address] [place], [place] [address]     Blood Alcohol level:  Lab Results  Component Value Date   [facility] <10 01/20/2023   ETH <10 11/07/2022    Metabolic Disorder Labs: Lab Results  Component Value Date   HGBA1C 5.1 11/23/2017   MPG 99.67 11/23/2017   MPG 103 03/03/2016   Lab Results  Component Value Date   PROLACTIN 3.1 05/02/2022   PROLACTIN 17.2 11/01/2021   Lab Results  Component Value Date   CHOL 153 01/26/2023   TRIG 48 01/26/2023   HDL 40 (L) 01/26/2023   CHOLHDL 3.8 01/26/2023   VLDL 10 01/26/2023   LDLCALC 103 (H) 01/26/2023   LDLCALC 120 (H) 11/11/2022    Physical Findings: AIMS:  , ,  ,  ,    CIWA:    COWS:     Musculoskeletal: Strength & Muscle Tone: within normal limits Gait & Station: normal Patient leans: N/A  Psychiatric Specialty Exam:  Presentation  General Appearance:  Appropriate for Environment  Eye Contact: Fair  Speech: Clear and Coherent; Normal Rate  Speech Volume: Normal  Handedness: Right   Mood and Affect  Mood: -- ("ok, I'm feeling  better")  Affect: Blunt   Thought Process  Thought Processes: Coherent; Goal Directed; Linear  Descriptions of Associations:Intact  Orientation:Full (Time, Place and Person)  Thought Content:Logical  History of Schizophrenia/Schizoaffective disorder:No  Duration of Psychotic Symptoms:No data recorded Hallucinations:Hallucinations: None  Ideas of Reference:None  Suicidal Thoughts:Suicidal Thoughts: No  Homicidal Thoughts:Homicidal Thoughts: No   Sensorium  Memory: Immediate Good; Recent Good; Remote Good  Judgment: Intact  Insight: Present   Executive Functions  Concentration: Good  Attention Span: Good  Recall: Good  Fund of Knowledge: Good  Language: Good   Psychomotor Activity  Psychomotor Activity: Psychomotor Activity: Normal   Assets  Assets: Communication Skills; Desire for Improvement; Financial Resources/Insurance; Resilience   Sleep  Sleep: Sleep: Fair    Physical Exam: Physical Exam Constitutional:      General: She is not in acute distress.    Appearance:  She is not ill-appearing, toxic-appearing or diaphoretic.  Eyes:     General: No scleral icterus. Cardiovascular:     Rate and Rhythm: Normal rate.  Pulmonary:     Effort: Pulmonary effort is normal. No respiratory distress.  Neurological:     Mental Status: She is alert and oriented to person, place, and time.  Psychiatric:        Attention and Perception: Attention and perception normal.        Mood and Affect: Mood normal. Affect is blunt.        Speech: Speech normal.        Behavior: Behavior normal. Behavior is cooperative.        Thought Content: Thought content normal.    Review of Systems  Constitutional:  Negative for chills and fever.  Respiratory:  Negative for shortness of breath.   Cardiovascular:  Negative for chest pain and palpitations.  Gastrointestinal:  Negative for abdominal pain.  Musculoskeletal:  Positive for joint pain.   Neurological:  Negative for headaches.  Psychiatric/Behavioral: Negative.     Blood pressure 113/67, pulse 73, temperature 97.7 F (36.5 C), resp. rate 16, height 5\' 8"  (1.727 m), weight 124.3 kg, last menstrual period 01/13/2023, SpO2 99%, unknown if currently breastfeeding. Body mass index is 41.66 kg/m.  Treatment Plan Summary: Daily contact with patient to assess and evaluate symptoms and progress in treatment, Medication management, and Plan    Schizoaffective disorder, bipolar type -abilify 5mg  oral daily  Agitation protocol -benadryl 50mg  oral or IM 2 times daily PRN agitation -haldol 5mg  oral or IM 2 times daily PRN agitation -ativan 2mg  oral or IM 2 times daily PRN agitation  Insomnia -trazodone 50mg  oral at bedtime PRN sleep  Anxiety -hydroxyzine 50mg  oral 3 times daily PRN anxiety  Lauree Chandler, NP 01/26/2023, 2:11 PM

## 2023-01-26 NOTE — Plan of Care (Signed)
Pt denies SI/HI/AVH, compliant with procedures on the unit  Problem: Education: Goal: Knowledge of General Education information will improve Description: Including pain rating scale, medication(s)/side effects and non-pharmacologic comfort measures Outcome: Progressing   Problem: Health Behavior/Discharge Planning: Goal: Ability to manage health-related needs will improve Outcome: Progressing   Problem: Clinical Measurements: Goal: Ability to maintain clinical measurements within normal limits will improve Outcome: Progressing Goal: Cardiovascular complication will be avoided Outcome: Progressing   Problem: Activity: Goal: Risk for activity intolerance will decrease Outcome: Progressing   Problem: Nutrition: Goal: Adequate nutrition will be maintained Outcome: Progressing   Problem: Coping: Goal: Level of anxiety will decrease Outcome: Progressing   Problem: Elimination: Goal: Will not experience complications related to bowel motility Outcome: Progressing   Problem: Pain Managment: Goal: General experience of comfort will improve Outcome: Progressing   Problem: Safety: Goal: Ability to remain free from injury will improve Outcome: Progressing   Problem: Skin Integrity: Goal: Risk for impaired skin integrity will decrease Outcome: Progressing   Problem: Education: Goal: Knowledge of [place] [facility] Education information/materials will improve Outcome: Progressing Goal: Emotional status will improve Outcome: Progressing Goal: Mental status will improve Outcome: Progressing Goal: Verbalization of understanding the information provided will improve Outcome: Progressing   Problem: Activity: Goal: Interest or engagement in activities will improve Outcome: Progressing Goal: Sleeping patterns will improve Outcome: Progressing   Problem: Coping: Goal: Ability to verbalize frustrations and anger appropriately will improve Outcome: Progressing Goal: Ability  to demonstrate self-control will improve Outcome: Progressing   Problem: Health Behavior/Discharge Planning: Goal: Identification of resources available to assist in meeting health care needs will improve Outcome: Progressing Goal: Compliance with treatment plan for underlying cause of condition will improve Outcome: Progressing   Problem: Physical Regulation: Goal: Ability to maintain clinical measurements within normal limits will improve Outcome: Progressing   Problem: Safety: Goal: Periods of time without injury will increase Outcome: Progressing   Problem: Education: Goal: Ability to make informed decisions regarding treatment will improve Outcome: Progressing   Problem: Health Behavior/Discharge Planning: Goal: Identification of resources available to assist in meeting health care needs will improve Outcome: Progressing   Problem: Self-Concept: Goal: Ability to disclose and discuss suicidal ideas will improve Outcome: Progressing Goal: Will verbalize positive feelings about self Outcome: Progressing   Problem: Self-Concept: Goal: Ability to identify factors that promote anxiety will improve Outcome: Progressing Goal: Level of anxiety will decrease Outcome: Progressing Goal: Ability to modify response to factors that promote anxiety will improve Outcome: Progressing

## 2023-01-26 NOTE — Progress Notes (Signed)
Pt calm and pleasant during assessment denying SI/HI/AVH. Pt stated she was having a hard time because her husband wants to file for divorce. Pt given support. Pt compliant with medication administration per MD orders. Pt given education, support, and encouragement to be active in her treatment plan. Pt being monitored Q 15 minutes for safety per unit protocol, remains safe on the unit

## 2023-01-26 NOTE — Plan of Care (Signed)
Pt compliant with medication administration per MD orders, and procedures on the unit  Problem: Education: Goal: Knowledge of General Education information will improve Description: Including pain rating scale, medication(s)/side effects and non-pharmacologic comfort measures Outcome: Progressing   Problem: Health Behavior/Discharge Planning: Goal: Ability to manage health-related needs will improve Outcome: Progressing   Problem: Clinical Measurements: Goal: Ability to maintain clinical measurements within normal limits will improve Outcome: Progressing Goal: Cardiovascular complication will be avoided Outcome: Progressing   Problem: Activity: Goal: Risk for activity intolerance will decrease Outcome: Progressing   Problem: Nutrition: Goal: Adequate nutrition will be maintained Outcome: Progressing   Problem: Coping: Goal: Level of anxiety will decrease Outcome: Progressing   Problem: Elimination: Goal: Will not experience complications related to bowel motility Outcome: Progressing   Problem: Pain Managment: Goal: General experience of comfort will improve Outcome: Progressing   Problem: Safety: Goal: Ability to remain free from injury will improve Outcome: Progressing   Problem: Skin Integrity: Goal: Risk for impaired skin integrity will decrease Outcome: Progressing   Problem: Education: Goal: Knowledge of [place] [facility] Education information/materials will improve Outcome: Progressing Goal: Emotional status will improve Outcome: Progressing Goal: Mental status will improve Outcome: Progressing Goal: Verbalization of understanding the information provided will improve Outcome: Progressing   Problem: Activity: Goal: Interest or engagement in activities will improve Outcome: Progressing Goal: Sleeping patterns will improve Outcome: Progressing   Problem: Coping: Goal: Ability to verbalize frustrations and anger appropriately will improve Outcome:  Progressing Goal: Ability to demonstrate self-control will improve Outcome: Progressing   Problem: Health Behavior/Discharge Planning: Goal: Identification of resources available to assist in meeting health care needs will improve Outcome: Progressing Goal: Compliance with treatment plan for underlying cause of condition will improve Outcome: Progressing   Problem: Physical Regulation: Goal: Ability to maintain clinical measurements within normal limits will improve Outcome: Progressing   Problem: Safety: Goal: Periods of time without injury will increase Outcome: Progressing   Problem: Education: Goal: Ability to make informed decisions regarding treatment will improve Outcome: Progressing   Problem: Health Behavior/Discharge Planning: Goal: Identification of resources available to assist in meeting health care needs will improve Outcome: Progressing   Problem: Self-Concept: Goal: Ability to disclose and discuss suicidal ideas will improve Outcome: Progressing Goal: Will verbalize positive feelings about self Outcome: Progressing   Problem: Self-Concept: Goal: Ability to identify factors that promote anxiety will improve Outcome: Progressing Goal: Level of anxiety will decrease Outcome: Progressing Goal: Ability to modify response to factors that promote anxiety will improve Outcome: Progressing
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
## 2023-01-26 NOTE — Group Note (Signed)
Recreation Therapy Group Note   Group Topic:Relaxation  Group Date: 01/26/2023 Start Time: 1000 End Time: 1050 Facilitators: Rosina Lowenstein, LRT, CTRS Location:  Craft Room  Group Description: PMR (Progressive Muscle Relaxation). LRT asks patients their current level of stress/anxiety from 1-10, with 10 being the highest. LRT educates patients on what PMR is and the benefits that come from it. Patients are asked to sit with their feet flat on the floor while sitting up and all the way back in their chair, if possible. LRT and pts follow a prompt through a speaker that requires you to tense and release different muscles in their body and focus on their breathing. During session, lights are off and soft music is being played. Pts are given a stress ball to use if needed. At the end of the prompt, LRT asks patients to rank their current levels of stress/anxiety from 1-10, 10 being the highest.   Goal Area(s) Addressed:  Patients will be able to describe progressive muscle relaxation.  Patient will practice using relaxation technique. Patient will identify a new coping skill.  Patient will follow multistep directions to reduce anxiety and stress.   Affect/Mood: Appropriate   Participation Level: Active and Engaged   Participation Quality: Independent   Behavior: Appropriate, Calm, and Cooperative   Speech/Thought Process: Directed   Insight: Good   Judgement: Good   Modes of Intervention: Activity   Patient Response to Interventions:  Attentive, Engaged, Interested , and Receptive   Education Outcome:  Acknowledges education   Clinical Observations/Individualized Feedback: Lakyah was active in their participation of session activities and group discussion. Pt identified that her anxiety was a 2 and her stress was a 3 before the session. After, she rated her anxiety and stress both a 0. Pt interacted well with LRT and peers duration of session.   Plan: Continue to engage patient  in RT group sessions 2-3x/week.   Rosina Lowenstein, LRT, CTRS 01/26/2023 11:06 AM

## 2023-01-26 NOTE — Group Note (Signed)
Date:  01/26/2023 Time:  5:19 PM  Group Topic/Focus:  Self Care:   The focus of this group is to help patients understand the importance of self-care in order to improve or restore emotional, physical, spiritual, interpersonal, and financial health.    Participation Level:  Active  Participation Quality:  Appropriate  Affect:  Appropriate  Cognitive:  Appropriate  Insight: Appropriate  Engagement in Group:  Engaged  Modes of Intervention:  Activity  Additional Comments:    Ayliana Casciano 01/26/2023, 5:19 PM

## 2023-01-26 NOTE — BHH Counselor (Signed)
Pt asked to see CSW after group. She shared that she is interested in seeing if [facility] has any bed availability. Pt went on to explain that this would be perfect because she has a good friend who lives near there and that way she could hang out with them during the day. CSW cautioned pt to have some back up options just in case this one did not work out. She shared that she was looking over the list still but would come up with some other options as well. Pt went on to update CSW that she had spoken with [facility] care coordinator, Alvy Beal T. Regarding TLC. She informed CSW that she had been told that she meets the requirements for this. However, pt also acknowledged that this is a process so she could go to the shelter while she waits. No other concerns expressed. Contact ended without incident.   Vilma Meckel. Algis Greenhouse, MSW, LCSW, LCAS 01/26/2023 3:41 PM

## 2023-01-26 NOTE — Group Note (Signed)
[facility] LCSW Group Therapy Note   Group Date: 01/26/2023 Start Time: 1315 End Time: 1415   Type of Therapy/Topic:  Group Therapy:  Balance in Life  Participation Level:  Active   Description of Group:    This group will address the concept of balance and how it feels and looks when one is unbalanced. Patients will be encouraged to process areas in their lives that are out of balance, and identify reasons for remaining unbalanced. Facilitators will guide patients utilizing problem- solving interventions to address and correct the stressor making their life unbalanced. Understanding and applying boundaries will be explored and addressed for obtaining  and maintaining a balanced life. Patients will be encouraged to explore ways to assertively make their unbalanced needs known to significant others in their lives, using other group members and facilitator for support and feedback.  Therapeutic Goals: Patient will identify two or more emotions or situations they have that consume much of in their lives. Patient will identify signs/triggers that life has become out of balance:  Patient will identify two ways to set boundaries in order to achieve balance in their lives:  Patient will demonstrate ability to communicate their needs through discussion and/or role plays  Summary of Patient Progress: Patient was present for the majority of the group process. She was pulled out of group by the provider at one point but came back and was actively involved in the conversation. Pt shared that crocheting, coloring in adult coloring book, and doing things that help her remain calm. She spoke about the importance of breaking the cycle that she is in. Pt shared that she has to refocus her energy on herself and her healing process. She acknowledged that she needs to work on impulsivity and figure out how she can stop herself and actually think things through all the way. Pt has some slight insight into the topic. She  appeared open and receptive to feedback/comments from both her peers and facilitator.    Therapeutic Modalities:   Cognitive Behavioral Therapy Solution-Focused Therapy Assertiveness Training   Glenis Smoker, LCSW

## 2023-01-26 NOTE — Group Note (Signed)
Date:  01/26/2023 Time:  10:38 AM  Group Topic/Focus:  Activity Group:  Focus of the group was to encourage activities such as coloring, playing cards or just having conversations for the benefit of mental health treatment.    Participation Level:  Active  Participation Quality:  Appropriate  Affect:  Appropriate  Cognitive:  Appropriate  Insight: Appropriate  Engagement in Group:  Engaged  Modes of Intervention:  Activity  Additional Comments:    Mary Sella Hope Brandenburger 01/26/2023, 10:38 AM

## 2023-01-27 DIAGNOSIS — T1491XA Suicide attempt, initial encounter: Secondary | ICD-10-CM | POA: Diagnosis not present

## 2023-01-27 LAB — HEMOGLOBIN A1C
Hgb A1c MFr Bld: 5.4 % (ref 4.8–5.6)
Mean Plasma Glucose: 108 mg/dL

## 2023-01-27 MED ORDER — ARIPIPRAZOLE 5 MG PO TABS
7.5000 mg | ORAL_TABLET | Freq: Every day | ORAL | Status: DC
  Administered 2023-01-28 – 2023-01-30 (×3): 7.5 mg via ORAL
  Filled 2023-01-27 (×3): qty 2

## 2023-01-27 NOTE — Progress Notes (Addendum)
[facility] MD Progress Note  01/27/2023 11:36 AM Melanie Bell  MRN:  [id number]  Subjective:  Pt chart reviewed, discussed with interdisciplinary team, and seen on rounds. Patient is alert and oriented x4, calm and cooperative, very attentive and engages well with psychiatric nurse practitioner.   On today's evaluation he is observed to be standing in the dayroom. She appears to be receptive to current situation at hand to include searching for a shelter. She appears remorseful and apologetic for her suicide attempt. She shows insight into the need for therapy and ongoing medication for further stabilization. She further denies any acute psychiatric symptoms that include suicidal ideations, homicidal ideations, and or auditory or visual hallucinations.  Patient denies any complaints and or questions. She reports eating and sleeping well. When asked about her progress " I am doing better than when I came in. My goal today is to find out when I leave. I have some slight anxiety after finding out I qualify for housing. " She is able to reflect things she could have done differently instead of overdosing. She reports that she called 911, after realizing what she did. " No one called 911 for me. I called 911. I realized I didn't want to die." She continues to refute suicidal ideation, homicidal ideation , and or hallucinations.   She is very appropriate and does seem to have a linear conversation.  There does not appear to be any evidence of confabulation, psychosis, delusional thinking.  She does not appear to be responding to internal stimuli, external stimuli.  She is able to engage well and follow all commands.  She further denies any thoughts to want to harm herself or other people.  She has been compliant with her psychotropic medications outside of the hospital.  Will psychiatrically clear at this time, and begin to prepare for discharge.   Principal Problem: Suicide attempt [facility])  Diagnosis: Principal  Problem:   Suicide attempt [facility]) Active Problems:   Schizoaffective disorder, bipolar type (HCC)  Total Time spent with patient:  25 minutes  Past Psychiatric History: Reported history of bipolar disorder vs schizoaffective disorder  Past Medical History:  Past Medical History:  Diagnosis Date   Allergy    Anxiety    Arthritis    Asthma    Depression    Foreign body aspiration    GERD (gastroesophageal reflux disease)    Hallucinations 09/30/2014   Sizoaffective   Hyperlipidemia    Hypertension    Hypoglycemia    Intentional ingestion of batteries 04/21/2016    2 AAA batteries and 1 thumb tack; intent to hurt herself/notes 04/21/2016   Seizure (HCC) 01/16/2023   Self-inflicted injury 11/23/2017   Suicidal ideation 01/02/2018   Tardive dyskinesia 10/2014   recent onset   Thyroid disease     Past Surgical History:  Procedure Laterality Date   ABDOMINAL SURGERY     "years ago" to remove foreign objects   APPENDECTOMY     BREAST EXCISIONAL BIOPSY Right 09/03/2007   surgical bx removed fibroadeoma   BREAST LUMPECTOMY Right    CHOLECYSTECTOMY  02/2022   COLONOSCOPY WITH PROPOFOL N/A 09/10/2015   Procedure: COLONOSCOPY WITH PROPOFOL;  Surgeon: Christena Deem, MD;  Location: [facility] ENDOSCOPY;  Service: Endoscopy;  Laterality: N/A;   ESOPHAGOGASTRODUODENOSCOPY N/A 11/28/2014   Procedure: ESOPHAGOGASTRODUODENOSCOPY (EGD);  Surgeon: Scot Jun, MD;  Location: [facility] ENDOSCOPY;  Service: Endoscopy;  Laterality: N/A;   ESOPHAGOGASTRODUODENOSCOPY N/A 02/21/2016   Procedure: ESOPHAGOGASTRODUODENOSCOPY (EGD);  Surgeon: Eleonore Chiquito  Lavon Paganini, MD;  Location: [facility] ENDOSCOPY;  Service: Endoscopy;  Laterality: N/A;   ESOPHAGOGASTRODUODENOSCOPY N/A 04/21/2016   Procedure: ESOPHAGOGASTRODUODENOSCOPY (EGD);  Surgeon: Iva Boop, MD;  Location[facility] ENDOSCOPY;  Service: Endoscopy;  Laterality: N/A;   ESOPHAGOGASTRODUODENOSCOPY N/A 05/06/2016   Procedure: ESOPHAGOGASTRODUODENOSCOPY (EGD);   Surgeon: Wyline Mood, MD;  Location: [facility] ENDOSCOPY;  Service: Gastroenterology;  Laterality: N/A;   ESOPHAGOGASTRODUODENOSCOPY N/A 06/13/2016   Procedure: ESOPHAGOGASTRODUODENOSCOPY (EGD);  Surgeon: Midge Minium, MD;  Location: [facility] ENDOSCOPY;  Service: Endoscopy;  Laterality: N/A;   ESOPHAGOGASTRODUODENOSCOPY N/A 01/14/2022   Procedure: ESOPHAGOGASTRODUODENOSCOPY (EGD);  Surgeon: Toledo, Boykin Nearing, MD;  Location: [facility] ENDOSCOPY;  Service: Gastroenterology;  Laterality: N/A;   ESOPHAGOGASTRODUODENOSCOPY (EGD) WITH PROPOFOL N/A 02/29/2016   Procedure: ESOPHAGOGASTRODUODENOSCOPY (EGD) WITH PROPOFOL;  Surgeon: Midge Minium, MD;  Location: [facility] ENDOSCOPY;  Service: Endoscopy;  Laterality: N/A;   ESOPHAGOGASTRODUODENOSCOPY (EGD) WITH PROPOFOL N/A 01/15/2018   Procedure: ESOPHAGOGASTRODUODENOSCOPY (EGD) WITH PROPOFOL;  Surgeon: Corbin Ade, MD;  Location: [facility] ENDO SUITE;  Service: Endoscopy;  Laterality: N/A;  retrieval forein body   LAPAROSCOPIC LYSIS OF ADHESIONS  02/16/2022   Procedure: LAPAROSCOPIC LYSIS OF ADHESIONS;  Surgeon: Henrene Dodge, MD;  Location: [facility] ORS;  Service: General;;   LAPAROTOMY N/A 09/12/2015   Procedure: EXPLORATORY LAPAROTOMY;  Surgeon: Lattie Haw, MD;  Location: [facility] ORS;  Service: General;  Laterality: N/A;   SIGMOIDOSCOPY N/A 09/12/2015   Procedure: Daiva Huge;  Surgeon: Lattie Haw, MD;  Location: [facility] ORS;  Service: General;  Laterality: N/A;   WISDOM TOOTH EXTRACTION     Family History:  Family History  Problem Relation Age of Onset   Depression Mother    Hypertension Mother    Sleep apnea Mother    Asthma Mother    COPD Mother    Diabetes Mother    Anxiety disorder Mother    Arthritis Mother    Heart failure Mother    Breast cancer Maternal Aunt        20's   Stroke Maternal Grandmother    Heart attack Maternal Grandfather    Family Psychiatric  History: Grandmother - schizophrenia, biological father - schizophrenia, mother - depression,  anxiety, sister - depression, anxiety, brother - adhd Social History:  Social History   Substance and Sexual Activity  Alcohol Use No     Social History   Substance and Sexual Activity  Drug Use No    Social History   Socioeconomic History   Marital status: Married    Spouse name: Not on file   Number of children: 0   Years of education: 12   Highest education level: Not on file  Occupational History   Occupation: unemployed  Tobacco Use   Smoking status: Former    Current packs/day: 0.00    Average packs/day: 0.5 packs/day for 3.0 years (1.5 ttl pk-yrs)    Types: Cigarettes    Start date: 08/14/2013    Quit date: 08/14/2016    Years since quitting: 6.4    Passive exposure: Past   Smokeless tobacco: Never  Vaping Use   Vaping status: Never Used  Substance and Sexual Activity   Alcohol use: No   Drug use: No   Sexual activity: Yes    Partners: Male    Birth control/protection: None  Other Topics Concern   Not on file  Social History Narrative   Right handed    One story home   Drinks occasional caffeine   Social Determinants of Health   Financial Resource Strain:  Low Risk  (01/11/2023)   Overall Financial Resource Strain (CARDIA)    Difficulty of Paying Living Expenses: Not very hard  Food Insecurity: Food Insecurity Present (01/24/2023)   Hunger Vital Sign    Worried About Running Out of Food in the Last Year: Sometimes true    Ran Out of Food in the Last Year: Sometimes true  Transportation Needs: No Transportation Needs (01/24/2023)   PRAPARE - Administrator, Civil Service (Medical): No    Lack of Transportation (Non-Medical): No  Physical Activity: Sufficiently Active (01/11/2023)   Exercise Vital Sign    Days of Exercise per Week: 5 days    Minutes of Exercise per Session: 30 min  Recent Concern: Physical Activity - Insufficiently Active (12/27/2022)   Exercise Vital Sign    Days of Exercise per Week: 7 days    Minutes of Exercise per Session:  20 min  Stress: No Stress Concern Present (01/11/2023)   [employer] of Occupational Health - Occupational Stress Questionnaire    Feeling of Stress : Not at all  Social Connections: Moderately Integrated (01/11/2023)   Social Connection and Isolation Panel [NHANES]    Frequency of Communication with Friends and Family: More than three times a week    Frequency of Social Gatherings with Friends and Family: Once a week    Attends Religious Services: More than 4 times per year    Active Member of [employer] or Organizations: No    Attends Engineer, structural: Never    Marital Status: Married   Sleep: Fair  Appetite:  Fair  Current Medications: Current Facility-Administered Medications  Medication Dose Route Frequency Provider Last Rate Last Admin   acetaminophen (TYLENOL) tablet 650 mg  650 mg Oral Q6H PRN Bennett, Christal H, NP   650 mg at 01/26/23 1240   albuterol (VENTOLIN HFA) 108 (90 Base) MCG/ACT inhaler 2 puff  2 puff Inhalation Q6H PRN Bennett, Christal H, NP       alum & mag hydroxide-simeth (MAALOX/MYLANTA) 200-200-20 MG/5ML suspension 15 mL  15 mL Oral Q4H PRN Bennett, Christal H, NP       ARIPiprazole (ABILIFY) tablet 5 mg  5 mg Oral Daily Bennett, Christal H, NP   5 mg at 01/27/23 0841   diphenhydrAMINE (BENADRYL) capsule 50 mg  50 mg Oral BID PRN Bennett, Christal H, NP       Or   diphenhydrAMINE (BENADRYL) injection 50 mg  50 mg Intramuscular BID PRN Bennett, Christal H, NP       furosemide (LASIX) tablet 20 mg  20 mg Oral Daily Bennett, Christal H, NP   20 mg at 01/27/23 0841   haloperidol (HALDOL) tablet 5 mg  5 mg Oral BID PRN Bennett, Christal H, NP       Or   haloperidol lactate (HALDOL) injection 5 mg  5 mg Intramuscular BID PRN Bennett, Christal H, NP       hydrocerin (EUCERIN) cream   Topical BID PRN Lauree Chandler, NP       hydrOXYzine (ATARAX) tablet 50 mg  50 mg Oral TID PRN Bennett, Christal H, NP   50 mg at 01/24/23 1804   ibuprofen (ADVIL)  tablet 400 mg  400 mg Oral Q6H PRN Lauree Chandler, NP   400 mg at 01/26/23 1808   levothyroxine (SYNTHROID) tablet 75 mcg  75 mcg Oral QAC breakfast Lauree Chandler, NP   75 mcg at 01/27/23 0626   LORazepam (ATIVAN) tablet 2 mg  2 mg Oral BID PRN Bennett, Christal H, NP       Or   LORazepam (ATIVAN) injection 2 mg  2 mg Intramuscular BID PRN Bennett, Christal H, NP       magnesium hydroxide (MILK OF MAGNESIA) suspension 30 mL  30 mL Oral Daily PRN Bennett, Christal H, NP       pantoprazole (PROTONIX) EC tablet 40 mg  40 mg Oral Daily Bennett, Christal H, NP   40 mg at 01/27/23 0841   polyethylene glycol (MIRALAX / GLYCOLAX) packet 17 g  17 g Oral Daily Bennett, Christal H, NP   17 g at 01/26/23 0854   traZODone (DESYREL) tablet 50 mg  50 mg Oral QHS PRN Willeen Cass, Christal H, NP   50 mg at 01/26/23 2114   vitamin B-12 (CYANOCOBALAMIN) tablet 100 mcg  100 mcg Oral Daily Willeen Cass, Christal H, NP   100 mcg at 01/27/23 4098    Lab Results:  Results for orders placed or performed during the hospital encounter of 01/24/23 (from the past 48 hour(s))  Lipid panel     Status: Abnormal   Collection Time: 01/26/23  6:45 AM  Result Value Ref Range   Cholesterol 153 0 - 200 mg/dL   Triglycerides 48 <119 mg/dL   HDL 40 (L) >14 mg/dL   Total CHOL/HDL Ratio 3.8 RATIO   VLDL 10 0 - 40 mg/dL   LDL Cholesterol 782 (H) 0 - 99 mg/dL    Comment:        Total Cholesterol/HDL:CHD Risk Coronary Heart Disease Risk Table                     Men   Women  1/2 Average Risk   3.4   3.3  Average Risk       5.0   4.4  2 X Average Risk   9.6   7.1  3 X Average Risk  23.4   11.0        Use the calculated Patient Ratio above and the CHD Risk Table to determine the patient's CHD Risk.        ATP III CLASSIFICATION (LDL):  <100     mg/dL   Optimal  956-213  mg/dL   Near or Above                    Optimal  130-159  mg/dL   Borderline  086-578  mg/dL   High  >469     mg/dL   Very High Performed at [facility], [address] [place], [place] [address]   Hemoglobin A1c     Status: None   Collection Time: 01/26/23  6:45 AM  Result Value Ref Range   Hgb A1c MFr Bld 5.4 4.8 - 5.6 %    Comment: (NOTE)         Prediabetes: 5.7 - 6.4         Diabetes: >6.4         Glycemic control for adults with diabetes: <7.0    Mean Plasma Glucose 108 mg/dL    Comment: (NOTE) Performed At: [facility] [address] [place], [place] [id number] Jolene Schimke MD UU:[id number]     Blood Alcohol level:  Lab Results  Component Value Date   [facility] <10 01/20/2023   ETH <10 11/07/2022    Metabolic Disorder Labs: Lab Results  Component Value Date   HGBA1C 5.4 01/26/2023   MPG 108 01/26/2023  MPG 99.67 11/23/2017   Lab Results  Component Value Date   PROLACTIN 3.1 05/02/2022   PROLACTIN 17.2 11/01/2021   Lab Results  Component Value Date   CHOL 153 01/26/2023   TRIG 48 01/26/2023   HDL 40 (L) 01/26/2023   CHOLHDL 3.8 01/26/2023   VLDL 10 01/26/2023   LDLCALC 103 (H) 01/26/2023   LDLCALC 120 (H) 11/11/2022    Physical Findings: AIMS:  , ,  ,  ,    CIWA:    COWS:     Musculoskeletal: Strength & Muscle Tone: within normal limits Gait & Station: normal Patient leans: N/A  Psychiatric Specialty Exam:  Presentation  General Appearance:  Appropriate for Environment  Eye Contact: Fair  Speech: Clear and Coherent; Normal Rate  Speech Volume: Normal  Handedness: Right   Mood and Affect  Mood: -- ("ok, I'm feeling better")  Affect: Blunt   Thought Process  Thought Processes: Coherent; Goal Directed; Linear  Descriptions of Associations:Intact  Orientation:Full (Time, Place and Person)  Thought Content:Logical  History of Schizophrenia/Schizoaffective disorder:No  Duration of Psychotic Symptoms:No data recorded Hallucinations:Hallucinations: None  Ideas of Reference:None  Suicidal Thoughts:Suicidal Thoughts: No  Homicidal  Thoughts:Homicidal Thoughts: No   Sensorium  Memory: Immediate Good; Recent Good; Remote Good  Judgment: Intact  Insight: Present   Executive Functions  Concentration: Good  Attention Span: Good  Recall: Good  Fund of Knowledge: Good  Language: Good   Psychomotor Activity  Psychomotor Activity: Psychomotor Activity: Normal   Assets  Assets: Communication Skills; Desire for Improvement; Financial Resources/Insurance; Resilience   Sleep  Sleep: Sleep: Fair    Physical Exam: Physical Exam Constitutional:      General: She is not in acute distress.    Appearance: Normal appearance. She is normal weight. She is not ill-appearing, toxic-appearing or diaphoretic.  Eyes:     General: No scleral icterus. Cardiovascular:     Rate and Rhythm: Normal rate.  Pulmonary:     Effort: Pulmonary effort is normal. No respiratory distress.  Skin:    Capillary Refill: Capillary refill takes less than 2 seconds.  Neurological:     General: No focal deficit present.     Mental Status: She is alert and oriented to person, place, and time. Mental status is at baseline.  Psychiatric:        Attention and Perception: Attention and perception normal.        Mood and Affect: Mood normal. Affect is blunt.        Speech: Speech normal.        Behavior: Behavior normal. Behavior is cooperative.        Thought Content: Thought content normal.    Review of Systems  Constitutional:  Negative for chills and fever.  Respiratory:  Negative for shortness of breath.   Cardiovascular:  Negative for chest pain and palpitations.  Gastrointestinal:  Negative for abdominal pain.  Musculoskeletal:  Positive for joint pain.  Neurological:  Negative for headaches.  Psychiatric/Behavioral: Negative.     Blood pressure (!) 108/51, pulse 78, temperature 99.2 F (37.3 C), temperature source Oral, resp. rate 19, height 5\' 8"  (1.727 m), weight 124.3 kg, last menstrual period 01/13/2023,  SpO2 100%, unknown if currently breastfeeding. Body mass index is 41.66 kg/m.  Treatment Plan Summary: Daily contact with patient to assess and evaluate symptoms and progress in treatment, Medication management, and Plan    Schizoaffective disorder, bipolar type -increase Abilify 7.5mg  oral daily  Agitation protocol -benadryl 50mg  oral or IM  2 times daily PRN agitation -haldol 5mg  oral or IM 2 times daily PRN agitation -ativan 2mg  oral or IM 2 times daily PRN agitation  Insomnia -trazodone 50mg  oral at bedtime PRN sleep  Anxiety -hydroxyzine 50mg  oral 3 times daily PRN anxiety   -Rescind IVC, sign voluntary.  -TOC for resources and shelter.  Maryagnes Amos, FNP 01/27/2023, 11:36 AM

## 2023-01-27 NOTE — B[facility] Counselor (Signed)
CSW was updated by NP that pt is ready for discharge and may be as early as this weekend.   CSW began contacting shelter options which had been discussed with pt. [facility] denied having any bed availability this weekend.   CSW met with pt update her regarding this. She shared that she would prefer [facility] over [facility].   Holiday representative [facility]) was contacted. CSW was informed that they no longer have a shelter component to their program.   [facility]) was contacted. CSW was informed that they have bed availiability this weekend but pt would need to call the hotline [phone]) and leave her name and contact information. It was explained that this would put pt on the waitlist. After this pt can present there on Wednesday as a walk-in between 8am-12pm. It was emphasized that pt would need to be there before 12 noon in order to be seen that day. If they make it in before noon they get to complete the intake and screening. He shared that it is a dorm style shelter and people can stay there anywhere from 30-90 days or longer. Representative [name] also shared that they provide meals. Pt will need her ID and social security card(recommended as well) for entry. No other concerns expressed. Contact ended without incident.   CSW met with pt to update regarding contacts. She was informed of the information which had come from [facility]. Pt shared that she was just concerned about what she would do until Wednesday if discharged this weekend. CSW explained that the [facility] might be a place to consider as they provide necessities for homeless people, like access to showers, laundry, and care coordination services. Pt open to this. She asked about getting her things from her home. CSW shared that he would attempt to contact husband to follow up about this and then call [facility] about whether they would be willing to pick items up if he has packed them. She agreed. Pt and CSW  discussed police escort as an option. CSW shared that he was not certain if they would be would give her the contact information if he was not able to get any kind of clarity. She agreed. No other concerns expressed. Contact ended without incident.   CSW contacted husband, Anae Steenson [phone]). He answered the phone and informed CSW that he was on a bus, then further specified that he was on a school bus (although the background was silent). CSW inquired if he had a moment for a quick question or if he would need to be called back. He shared that he would need to be called back later, and then further specified this that a call back on Monday would be preferred. No other concerns expressed. Contact ended without incident.   CSW contact [facility] services team lead, Liborio Nixon and informed her that pt may be discharged over the weekend. She voiced some concerns as pt is going to the shelter and that pt may not be able to get her medication. Liborio Nixon asked if it could be Monday instead. CSW informed her that team would be updated regarding this but there was no guarantee that she would not be discharged. She voiced understanding. Liborio Nixon did offer to come see her on Monday if she was still here at the hospital. CSW shared that he would update the weekend CSW regarding this case and encouraged to call Liborio Nixon if pt is discharged over the weekend. She agreed. No other concerns expressed.  Contact ended without incident.   CSW updated team about the request for discharge on Monday via secure chart. Attending says discharge whenever is ok. Staff RN was also asked to pass information on to her regarding attempted contact with her husband and the contact number for non-emergency services [facility] [phone]) to be given to pt if she would like to try to arrange police escort.   Vilma Meckel. Algis Greenhouse, MSW, LCSW, LCAS 01/27/2023 4:37 PM

## 2023-01-27 NOTE — Progress Notes (Signed)
Recreation Therapy Notes  LRT provided pt with nail care per pt request.     Melanie Bell 01/27/2023 2:32 PM

## 2023-01-27 NOTE — Group Note (Signed)
Date:  01/27/2023 Time:  7:16 PM  Group Topic/Focus:  Activity Group:  The purpose of the group was to hold a card game and communicate with each other to talk about our feelings.    Participation Level:  Active  Participation Quality:  Appropriate  Affect:  Appropriate  Cognitive:  Appropriate  Insight: Appropriate  Engagement in Group:  Engaged  Modes of Intervention:  Activity  Additional Comments:    Melanie Bell 01/27/2023, 7:16 PM

## 2023-01-27 NOTE — Group Note (Signed)
Recreation Therapy Group Note   Group Topic:Leisure Education  Group Date: 01/27/2023 Start Time: 1000 End Time: 1100 Facilitators: Rosina Lowenstein, LRT, CTRS Location:  Craft Room  Group Description: Leisure. Patients were given the option to choose from singing karaoke, coloring mandalas, using oil pastels, journaling, or playing with play-doh. LRT and pts discussed the meaning of leisure, the importance of participating in leisure during their free time/when they're outside of the hospital, as well as how our leisure interests can also serve as coping skills.    Goal Area(s) Addressed:  Patient will identify a current leisure interest.  Patient will learn the definition of "leisure". Patient will practice making a positive decision. Patient will have the opportunity to try a new leisure activity. Patient will communicate with peers and LRT.    Affect/Mood: Appropriate   Participation Level: Active and Engaged   Participation Quality: Independent   Behavior: Appropriate, Calm, and Cooperative   Speech/Thought Process: Coherent   Insight: Good   Judgement: Good   Modes of Intervention: Activity   Patient Response to Interventions:  Attentive, Engaged, Interested , and Receptive   Education Outcome:  Acknowledges education   Clinical Observations/Individualized Feedback: Melanie Bell was active in their participation of session activities and group discussion. Pt identified "crocheting and arts and crafts" as things she does in her free time. Pt chose to color mandalas while in group. Pt interacted well with LRT and peers duration of session.   Plan: Continue to engage patient in RT group sessions 2-3x/week.   Rosina Lowenstein, LRT, CTRS 01/27/2023 11:14 AM

## 2023-01-27 NOTE — Group Note (Signed)
Date:  01/27/2023 Time:  10:39 PM  Group Topic/Focus:  Wrap-Up Group:   The focus of this group is to help patients review their daily goal of treatment and discuss progress on daily workbooks.     Participation Level:  Active  Participation Quality:  Appropriate and Attentive  Affect:  Appropriate  Cognitive:  Appropriate  Insight: Appropriate and Good  Engagement in Group:  Supportive  Modes of Intervention:  Support  Additional Comments:     Belva Crome 01/27/2023, 10:39 PM

## 2023-01-27 NOTE — BHH Counselor (Signed)
CSW met with pt briefly per request. She requested to meet with CSW to update regarding her options for shelters. CSW was reminded that [employer] [facility] is her first choice. Other choices are identified as [facility], [facility] of [place], and [employer] Pt and CSW also discussed her clothes and things which she was hoping that her husband had packed up. Pt gave CSW verbal permission to contact her husband regarding this. CSW remembering that she had requested that [employer] be contacted about whether they could pick up/store her items. Pt shared that if they could not she might have to look into getting a sheriff's escort to go with her to pick it up. CSW shared that he could contact her husband and then [employer]. Pt voiced understanding. No other concerns expressed. Contact ended without incident.   Vilma Meckel. Algis Greenhouse, MSW, LCSW, LCAS 01/27/2023 1:25 PM

## 2023-01-27 NOTE — Progress Notes (Signed)
D- Patient alert and oriented. Patient presented in a preoccupied, but pleasant mood on assessment reporting that she slept good, but had continued complaints of right knee pain a "6/10", in which she did request PRN pain medication to help with some relief. Patient denied depression, but endorsed slight anxiety, stating that she doesn't know where she will go upon discharge and worried about getting her personal items. Patient also denies SI, HI, AVH, at this time. Patient's goal for today, per her self-inventory, is to "figure out plans for where to go when I do get discharged", in which she will "look at options of shelters and have a plan of where to go", in order to achieve her goal.  A- Scheduled medications administered to patient, per MD orders. Support and encouragement provided. Routine safety checks conducted every 15 minutes. Patient informed to notify staff with problems or concerns.  R- No adverse drug reactions noted. Patient contracts for safety at this time. Patient compliant with medications and treatment plan. Patient receptive, calm, and cooperative. Patient interacts well with others on the unit. Patient remains safe at this time.   01/27/23 0845  Psych Admission Type (Psych Patients Only)  Admission Status Involuntary  Psychosocial Assessment  Patient Complaints Anxiety  Eye Contact Fair  Facial Expression Worried  Affect Preoccupied  Speech Soft;Logical/coherent  Interaction Assertive  Motor Activity Slow  Appearance/Hygiene Unremarkable  Behavior Characteristics Cooperative;Appropriate to situation  Mood Preoccupied;Pleasant  Aggressive Behavior  Effect No apparent injury  Thought Process  Coherency Circumstantial  Content Preoccupation  Delusions None reported or observed  Perception WDL  Hallucination None reported or observed  Judgment WDL  Confusion None  Danger to Self  Current suicidal ideation? Denies  Agreement Not to Harm Self Yes  Description of  Agreement Verbal  Danger to Others  Danger to Others None reported or observed

## 2023-01-27 NOTE — Plan of Care (Signed)
  Problem: Education: Goal: Knowledge of General Education information will improve Description: Including pain rating scale, medication(s)/side effects and non-pharmacologic comfort measures Outcome: Progressing   Problem: Health Behavior/Discharge Planning: Goal: Ability to manage health-related needs will improve Outcome: Progressing   Problem: Clinical Measurements: Goal: Ability to maintain clinical measurements within normal limits will improve Outcome: Progressing Goal: Cardiovascular complication will be avoided Outcome: Progressing   Problem: Activity: Goal: Risk for activity intolerance will decrease Outcome: Progressing   Problem: Nutrition: Goal: Adequate nutrition will be maintained Outcome: Progressing   Problem: Coping: Goal: Level of anxiety will decrease Outcome: Progressing   Problem: Elimination: Goal: Will not experience complications related to bowel motility Outcome: Progressing   Problem: Pain Managment: Goal: General experience of comfort will improve Outcome: Progressing   Problem: Safety: Goal: Ability to remain free from injury will improve Outcome: Progressing   Problem: Skin Integrity: Goal: Risk for impaired skin integrity will decrease Outcome: Progressing   Problem: Education: Goal: Knowledge of [place] General Education information/materials will improve Outcome: Progressing Goal: Emotional status will improve Outcome: Progressing Goal: Mental status will improve Outcome: Progressing Goal: Verbalization of understanding the information provided will improve Outcome: Progressing   Problem: Activity: Goal: Interest or engagement in activities will improve Outcome: Progressing Goal: Sleeping patterns will improve Outcome: Progressing   Problem: Coping: Goal: Ability to verbalize frustrations and anger appropriately will improve Outcome: Progressing Goal: Ability to demonstrate self-control will improve Outcome:  Progressing   Problem: Health Behavior/Discharge Planning: Goal: Identification of resources available to assist in meeting health care needs will improve Outcome: Progressing Goal: Compliance with treatment plan for underlying cause of condition will improve Outcome: Progressing   Problem: Physical Regulation: Goal: Ability to maintain clinical measurements within normal limits will improve Outcome: Progressing   Problem: Safety: Goal: Periods of time without injury will increase Outcome: Progressing   Problem: Education: Goal: Ability to make informed decisions regarding treatment will improve Outcome: Progressing   Problem: Health Behavior/Discharge Planning: Goal: Identification of resources available to assist in meeting health care needs will improve Outcome: Progressing   Problem: Self-Concept: Goal: Ability to disclose and discuss suicidal ideas will improve Outcome: Progressing Goal: Will verbalize positive feelings about self Outcome: Progressing   Problem: Self-Concept: Goal: Ability to identify factors that promote anxiety will improve Outcome: Progressing Goal: Level of anxiety will decrease Outcome: Progressing Goal: Ability to modify response to factors that promote anxiety will improve Outcome: Progressing

## 2023-01-27 NOTE — Progress Notes (Signed)
Patient refused scheduled Miralax, stating that she has had some diarrhea this morning.

## 2023-01-27 NOTE — Group Note (Unsigned)
Date:  01/27/2023 Time:  1:58 AM  Group Topic/Focus:  Spirituality:   The focus of this group is to discuss how one's spirituality can aide in recovery.     Participation Level:  {BHH PARTICIPATION QIHKV:42595}  Participation Quality:  {BHH PARTICIPATION QUALITY:22265}  Affect:  {BHH AFFECT:22266}  Cognitive:  {BHH COGNITIVE:22267}  Insight: {BHH Insight2:20797}  Engagement in Group:  {BHH ENGAGEMENT IN GLOVF:64332}  Modes of Intervention:  {BHH MODES OF INTERVENTION:22269}  Additional Comments:  ***  Lenore Cordia 01/27/2023, 1:58 AM

## 2023-01-28 DIAGNOSIS — F250 Schizoaffective disorder, bipolar type: Secondary | ICD-10-CM

## 2023-01-28 MED ORDER — ARIPIPRAZOLE 15 MG PO TABS: 7.5000 mg | ORAL_TABLET | Freq: Every day | ORAL | 0 refills | Status: DC

## 2023-01-28 MED ORDER — FUROSEMIDE 20 MG PO TABS: 20.0000 mg | ORAL_TABLET | Freq: Every day | ORAL | 0 refills | Status: DC

## 2023-01-28 NOTE — Progress Notes (Signed)
Patient alert and oriented x 4, affect is flat but brightens upon approach no distress noted, denies SI/HI/AVH, was offered emotional support and encouragement, 15 minutes safety checks maintained, will continue to monitor.

## 2023-01-28 NOTE — Progress Notes (Signed)
  [facility] Adult Case Management Discharge Plan :  Will you be returning to the same living situation after discharge:  No. At discharge, do you have transportation home?: No. Do you have the ability to pay for your medications: No.  Release of information consent forms completed and in the chart;  Patient's signature needed at discharge.  Patient to Follow up at:  Follow-up Information     [address] [facility] [place] Schedule an appointment as soon as possible for a visit.   Contact information: [address] [place] [address] [place] [address] [phone]                 Next level of care provider has access to [facility] Link:yes  Safety Planning and Suicide Prevention discussed: Yes,  Completed with the patient.     Has patient been referred to the Quitline?: Patient refused referral for treatment  Patient has been referred for addiction treatment: Yes, referral information given but appointment not made [facility]. (list facility).  Marshell Levan, LCSW 01/28/2023, 9:30 AM

## 2023-01-28 NOTE — Group Note (Signed)
Date:  01/28/2023 Time:  4:52 PM  Group Topic/Focus:  Activity Group:  The focus of this group is to encourage patients to go outside to the courtyard to assist with their mental health to get some fresh air and exercise.     Participation Level:  Active  Participation Quality:  Appropriate  Affect:  Appropriate  Cognitive:  Appropriate  Insight: Appropriate  Engagement in Group:  Engaged  Modes of Intervention:  Activity  Additional Comments:    Mary Sella Kellis Topete 01/28/2023, 4:52 PM

## 2023-01-28 NOTE — Progress Notes (Signed)
I assumed care for Melanie Bell at about 08:00.  She was resting in the day area, later seen during am med pass.  Vital signs WNL, she denied any avh/hi/si during my assessment, was looking forward to discharge to the community today.  She became very anxious and agitated after discharge plans failed.She received prn Lorazepam, Haldol and Benadryl. She later received prn Ibuprofen for headache. She has since remained calm, prns thus deemed effective at this time.Pt is being monitored as ordered. She attended groups this shift. Vital signs WNL

## 2023-01-28 NOTE — Progress Notes (Signed)
[facility] MD Progress Note  01/28/2023 5:17 PM Melanie Bell  MRN:  [id number]  Subjective:  Pt chart, notes, labs, and vital signs reviewed.  The client reported her depression was "a lot better, very mild", no suicidal ideations.  Some anxiety as she is going to a new place, [employer].  "I will be ok.  Nothing I can't handle".  She was scheduled to discharge today but did not have medications in place and her ACT team, [facility], contacted and requested that the discharge wait until Monday when they could get her medications to her and visit her daily.  They reported she has a cycle of thinking she is pregnant and stopping her medications.  Then, she decompensates and self-harms or tries to end her life.  The discharge was cancelled.  The client handled the information well despite wanting to leave. Her ID card is at home with her husband who she is leaving and the ACT team can get this for her on Monday. The husband nor his family wanted her in contact with him, did not reveal this to the client.  She interacts well on the unit and participates in groups.  Frequently reads in her room when not socializing.  No concerns noted.  Principal Problem: Suicide attempt [facility])  Diagnosis: Principal Problem:   Suicide attempt [facility]) Active Problems:   Schizoaffective disorder, depressive type (HCC)   Schizoaffective disorder, bipolar type (HCC)  Total Time spent with patient:  25 minutes  Past Psychiatric History: Reported history of bipolar disorder vs schizoaffective disorder  Past Medical History:  Past Medical History:  Diagnosis Date   Allergy    Anxiety    Arthritis    Asthma    Depression    Foreign body aspiration    GERD (gastroesophageal reflux disease)    Hallucinations 09/30/2014   Sizoaffective   Hyperlipidemia    Hypertension    Hypoglycemia    Intentional ingestion of batteries 04/21/2016    2 AAA batteries and 1 thumb tack; intent to hurt herself/notes 04/21/2016    Seizure (HCC) 01/16/2023   Self-inflicted injury 11/23/2017   Suicidal ideation 01/02/2018   Tardive dyskinesia 10/2014   recent onset   Thyroid disease     Past Surgical History:  Procedure Laterality Date   ABDOMINAL SURGERY     "years ago" to remove foreign objects   APPENDECTOMY     BREAST EXCISIONAL BIOPSY Right 09/03/2007   surgical bx removed fibroadeoma   BREAST LUMPECTOMY Right    CHOLECYSTECTOMY  02/2022   COLONOSCOPY WITH PROPOFOL N/A 09/10/2015   Procedure: COLONOSCOPY WITH PROPOFOL;  Surgeon: Christena Deem, MD;  Location: [facility] ENDOSCOPY;  Service: Endoscopy;  Laterality: N/A;   ESOPHAGOGASTRODUODENOSCOPY N/A 11/28/2014   Procedure: ESOPHAGOGASTRODUODENOSCOPY (EGD);  Surgeon: Scot Jun, MD;  Location[facility] ENDOSCOPY;  Service: Endoscopy;  Laterality: N/A;   ESOPHAGOGASTRODUODENOSCOPY N/A 02/21/2016   Procedure: ESOPHAGOGASTRODUODENOSCOPY (EGD);  Surgeon: Napoleon Form, MD;  Location: [facility] ENDOSCOPY;  Service: Endoscopy;  Laterality: N/A;   ESOPHAGOGASTRODUODENOSCOPY N/A 04/21/2016   Procedure: ESOPHAGOGASTRODUODENOSCOPY (EGD);  Surgeon: Iva Boop, MD;  Location: [facility] ENDOSCOPY;  Service: Endoscopy;  Laterality: N/A;   ESOPHAGOGASTRODUODENOSCOPY N/A 05/06/2016   Procedure: ESOPHAGOGASTRODUODENOSCOPY (EGD);  Surgeon: Wyline Mood, MD;  Location: [facility] ENDOSCOPY;  Service: Gastroenterology;  Laterality: N/A;   ESOPHAGOGASTRODUODENOSCOPY N/A 06/13/2016   Procedure: ESOPHAGOGASTRODUODENOSCOPY (EGD);  Surgeon: Midge Minium, MD;  Location: [facility] ENDOSCOPY;  Service: Endoscopy;  Laterality: N/A;   ESOPHAGOGASTRODUODENOSCOPY N/A 01/14/2022  Procedure: ESOPHAGOGASTRODUODENOSCOPY (EGD);  Surgeon: Toledo, Boykin Nearing, MD;  Location: [facility] ENDOSCOPY;  Service: Gastroenterology;  Laterality: N/A;   ESOPHAGOGASTRODUODENOSCOPY (EGD) WITH PROPOFOL N/A 02/29/2016   Procedure: ESOPHAGOGASTRODUODENOSCOPY (EGD) WITH PROPOFOL;  Surgeon: Midge Minium, MD;  Location: [facility] ENDOSCOPY;   Service: Endoscopy;  Laterality: N/A;   ESOPHAGOGASTRODUODENOSCOPY (EGD) WITH PROPOFOL N/A 01/15/2018   Procedure: ESOPHAGOGASTRODUODENOSCOPY (EGD) WITH PROPOFOL;  Surgeon: Corbin Ade, MD;  [facility] ENDO [facility];  Service: Endoscopy;  Laterality: N/A;  retrieval forein body   LAPAROSCOPIC LYSIS OF ADHESIONS  02/16/2022   Procedure: LAPAROSCOPIC LYSIS OF ADHESIONS;  Surgeon: Henrene Dodge, MD;  Location: [facility] ORS;  Service: General;;   LAPAROTOMY N/A 09/12/2015   Procedure: EXPLORATORY LAPAROTOMY;  Surgeon: Lattie Haw, MD;  Location: [facility] ORS;  Service: General;  Laterality: N/A;   SIGMOIDOSCOPY N/A 09/12/2015   Procedure: Daiva Huge;  Surgeon: Lattie Haw, MD;  Location: [facility] ORS;  Service: General;  Laterality: N/A;   WISDOM TOOTH EXTRACTION     Family History:  Family History  Problem Relation Age of Onset   Depression Mother    Hypertension Mother    Sleep apnea Mother    Asthma Mother    COPD Mother    Diabetes Mother    Anxiety disorder Mother    Arthritis Mother    Heart failure Mother    Breast cancer Maternal Aunt        20's   Stroke Maternal Grandmother    Heart attack Maternal Grandfather    Family Psychiatric  History: Grandmother - schizophrenia, biological father - schizophrenia, mother - depression, anxiety, sister - depression, anxiety, brother - adhd Social History:  Social History   Substance and Sexual Activity  Alcohol Use No     Social History   Substance and Sexual Activity  Drug Use No    Social History   Socioeconomic History   Marital status: Married    Spouse name: Not on file   Number of children: 0   Years of education: 12   Highest education level: Not on file  Occupational History   Occupation: unemployed  Tobacco Use   Smoking status: Former    Current packs/day: 0.00    Average packs/day: 0.5 packs/day for 3.0 years (1.5 ttl pk-yrs)    Types: Cigarettes    Start date: 08/14/2013    Quit date: 08/14/2016    Years  since quitting: 6.4    Passive exposure: Past   Smokeless tobacco: Never  Vaping Use   Vaping status: Never Used  Substance and Sexual Activity   Alcohol use: No   Drug use: No   Sexual activity: Yes    Partners: Male    Birth control/protection: None  Other Topics Concern   Not on file  Social History Narrative   Right handed    One story home   Drinks occasional caffeine   Social Determinants of Health   Financial Resource Strain: Low Risk  (01/11/2023)   Overall Financial Resource Strain (CARDIA)    Difficulty of Paying Living Expenses: Not very hard  Food Insecurity: Food Insecurity Present (01/24/2023)   Hunger Vital Sign    Worried About Running Out of Food in the Last Year: Sometimes true    Ran Out of Food in the Last Year: Sometimes true  Transportation Needs: No Transportation Needs (01/24/2023)   PRAPARE - Administrator, Civil Service (Medical): No    Lack of Transportation (Non-Medical): No  Physical Activity: Sufficiently Active (  01/11/2023)   Exercise Vital Sign    Days of Exercise per Week: 5 days    Minutes of Exercise per Session: 30 min  Recent Concern: Physical Activity - Insufficiently Active (12/27/2022)   Exercise Vital Sign    Days of Exercise per Week: 7 days    Minutes of Exercise per Session: 20 min  Stress: No Stress Concern Present (01/11/2023)   [employer] of Occupational Health - Occupational Stress Questionnaire    Feeling of Stress : Not at all  Social Connections: Moderately Integrated (01/11/2023)   Social Connection and Isolation Panel [NHANES]    Frequency of Communication with Friends and Family: More than three times a week    Frequency of Social Gatherings with Friends and Family: Once a week    Attends Religious Services: More than 4 times per year    Active Member of [employer] or Organizations: No    Attends Engineer, structural: Never    Marital Status: Married   Sleep: Fair  Appetite:  Fair  Current  Medications: Current Facility-Administered Medications  Medication Dose Route Frequency Provider Last Rate Last Admin   albuterol (VENTOLIN HFA) 108 (90 Base) MCG/ACT inhaler 2 puff  2 puff Inhalation Q6H PRN Bennett, Christal H, NP       ARIPiprazole (ABILIFY) tablet 7.5 mg  7.5 mg Oral Daily Maryagnes Amos, FNP   7.5 mg at 01/28/23 0831   furosemide (LASIX) tablet 20 mg  20 mg Oral Daily Bennett, Christal H, NP   20 mg at 01/28/23 0831   hydrocerin (EUCERIN) cream   Topical BID PRN Lauree Chandler, NP       hydrOXYzine (ATARAX) tablet 50 mg  50 mg Oral TID PRN Willeen Cass, Christal H, NP   50 mg at 01/24/23 1804   levothyroxine (SYNTHROID) tablet 75 mcg  75 mcg Oral QAC breakfast Lauree Chandler, NP   75 mcg at 01/28/23 0654   magnesium hydroxide (MILK OF MAGNESIA) suspension 30 mL  30 mL Oral Daily PRN Bennett, Christal H, NP       pantoprazole (PROTONIX) EC tablet 40 mg  40 mg Oral Daily Bennett, Christal H, NP   40 mg at 01/28/23 4132   traZODone (DESYREL) tablet 50 mg  50 mg Oral QHS PRN Bennett, Christal H, NP   50 mg at 01/27/23 2047   vitamin B-12 (CYANOCOBALAMIN) tablet 100 mcg  100 mcg Oral Daily Bennett, Christal H, NP   100 mcg at 01/28/23 0831    Lab Results:  No results found for this or any previous visit (from the past 48 hour(s)).   Blood Alcohol level:  Lab Results  Component Value Date   ETH <10 01/20/2023   ETH <10 11/07/2022    Metabolic Disorder Labs: Lab Results  Component Value Date   HGBA1C 5.4 01/26/2023   MPG 108 01/26/2023   MPG 99.67 11/23/2017   Lab Results  Component Value Date   PROLACTIN 3.1 05/02/2022   PROLACTIN 17.2 11/01/2021   Lab Results  Component Value Date   CHOL 153 01/26/2023   TRIG 48 01/26/2023   HDL 40 (L) 01/26/2023   CHOLHDL 3.8 01/26/2023   VLDL 10 01/26/2023   LDLCALC 103 (H) 01/26/2023   LDLCALC 120 (H) 11/11/2022    Physical Findings: AIMS:  , ,  ,  ,    CIWA:    COWS:      Musculoskeletal: Strength & Muscle Tone: within normal limits Gait & Station: normal Patient  leans: N/A  Psychiatric Specialty Exam: Physical Exam Vitals and nursing note reviewed.  Constitutional:      General: She is not in acute distress.    Appearance: She is not ill-appearing, toxic-appearing or diaphoretic.  HENT:     Head: Normocephalic.  Eyes:     General: No scleral icterus. Pulmonary:     Effort: Pulmonary effort is normal. No respiratory distress.  Musculoskeletal:        General: Normal range of motion.     Cervical back: Normal range of motion.  Neurological:     General: No focal deficit present.     Mental Status: She is alert and oriented to person, place, and time.  Psychiatric:        Attention and Perception: Attention and perception normal.        Mood and Affect: Mood normal. Affect is blunt.        Speech: Speech normal.        Behavior: Behavior normal. Behavior is cooperative.        Thought Content: Thought content normal.     Review of Systems  Constitutional:  Negative for chills and fever.  Respiratory:  Negative for shortness of breath.   Cardiovascular:  Negative for chest pain and palpitations.  Gastrointestinal:  Negative for abdominal pain.  Neurological:  Negative for headaches.  Psychiatric/Behavioral:  Positive for depression. The patient is nervous/anxious.   All other systems reviewed and are negative.   Blood pressure (!) 102/49, pulse 90, temperature 98.2 F (36.8 C), resp. rate 17, height 5\' 8"  (1.727 m), weight 124.3 kg, last menstrual period 01/13/2023, SpO2 100%, unknown if currently breastfeeding.Body mass index is 41.66 kg/m.  General Appearance: Casual  Eye Contact:  Good  Speech:  Normal Rate  Volume:  Normal  Mood:  Anxious and Depressed  Affect:  Congruent  Thought Process:  Coherent  Orientation:  Full (Time, Place, and Person)  Thought Content:  WDL and Logical  Suicidal Thoughts:  No  Homicidal Thoughts:  No   Memory:  Immediate;   Good Recent;   Good Remote;   Good  Judgement:  Fair  Insight:  Fair  Psychomotor Activity:  Normal  Concentration:  Concentration: Good and Attention Span: Good  Recall:  Good  Fund of Knowledge:  Good  Language:  Good  Akathisia:  No  Handed:  Right  AIMS (if indicated):     Assets:  Leisure Time Physical Health Resilience Social Support  ADL's:  Intact  Cognition:  WNL  Sleep:        Physical Exam: Physical Exam Vitals and nursing note reviewed.  Constitutional:      General: She is not in acute distress.    Appearance: She is not ill-appearing, toxic-appearing or diaphoretic.  HENT:     Head: Normocephalic.  Eyes:     General: No scleral icterus. Pulmonary:     Effort: Pulmonary effort is normal. No respiratory distress.  Musculoskeletal:        General: Normal range of motion.     Cervical back: Normal range of motion.  Neurological:     General: No focal deficit present.     Mental Status: She is alert and oriented to person, place, and time.  Psychiatric:        Attention and Perception: Attention and perception normal.        Mood and Affect: Mood normal. Affect is blunt.        Speech: Speech normal.  Behavior: Behavior normal. Behavior is cooperative.        Thought Content: Thought content normal.    Review of Systems  Constitutional:  Negative for chills and fever.  Respiratory:  Negative for shortness of breath.   Cardiovascular:  Negative for chest pain and palpitations.  Gastrointestinal:  Negative for abdominal pain.  Neurological:  Negative for headaches.  Psychiatric/Behavioral:  Positive for depression. The patient is nervous/anxious.   All other systems reviewed and are negative.  Blood pressure (!) 102/49, pulse 90, temperature 98.2 F (36.8 C), resp. rate 17, height 5\' 8"  (1.727 m), weight 124.3 kg, last menstrual period 01/13/2023, SpO2 100%, unknown if currently breastfeeding. Body mass index is 41.66  kg/m.  Treatment Plan Summary: Daily contact with patient to assess and evaluate symptoms and progress in treatment, Medication management, and Plan    Schizoaffective disorder, bipolar type -abilify 5mg  oral daily  Agitation protocol -benadryl 50mg  oral or IM 2 times daily PRN agitation -haldol 5mg  oral or IM 2 times daily PRN agitation -ativan 2mg  oral or IM 2 times daily PRN agitation  Insomnia -trazodone 50mg  oral at bedtime PRN sleep  Anxiety -hydroxyzine 50mg  oral 3 times daily PRN anxiety  Nanine Means, NP 01/28/2023, 5:17 PM

## 2023-01-28 NOTE — Plan of Care (Signed)

## 2023-01-28 NOTE — Plan of Care (Signed)
°  Problem: Coping: °Goal: Level of anxiety will decrease °Outcome: Progressing °  °

## 2023-01-29 DIAGNOSIS — F251 Schizoaffective disorder, depressive type: Secondary | ICD-10-CM

## 2023-01-29 MED ORDER — ACETAMINOPHEN 325 MG PO TABS
650.0000 mg | ORAL_TABLET | Freq: Four times a day (QID) | ORAL | Status: DC | PRN
  Administered 2023-01-29: 650 mg via ORAL
  Filled 2023-01-29: qty 2

## 2023-01-29 NOTE — Progress Notes (Signed)
Pt is calm and cooperative, denies SI HI AVH, denies depression and anxiety and hopelesness.  Pt is compliant with medications and c/o menstrual pain and PRN was provided.  Her goal today is "continue to heal even though I'm so hurt."  Continued monitoring for safety.   01/29/23 1600  Psych Admission Type (Psych Patients Only)  Admission Status Involuntary  Psychosocial Assessment  Patient Complaints Anxiety  Eye Contact Fair  Facial Expression Anxious  Affect Anxious  Speech Logical/coherent  Interaction Assertive  Motor Activity Restless  Appearance/Hygiene Improved  Behavior Characteristics Anxious  Mood Anxious  Thought Process  Coherency WDL  Content Preoccupation  Delusions None reported or observed  Perception WDL  Hallucination None reported or observed  Judgment WDL  Confusion None  Danger to Self  Current suicidal ideation? Denies  Agreement Not to Harm Self Yes  Description of Agreement verbal  Danger to Others  Danger to Others None reported or observed

## 2023-01-29 NOTE — Group Note (Signed)
Date:  01/29/2023 Time:  8:35 PM  Group Topic/Focus:  Wrap-Up Group:   The focus of this group is to help patients review their daily goal of treatment and discuss progress on daily workbooks.    Participation Level:  Active  Participation Quality:  Appropriate and Attentive  Affect:  Appropriate  Cognitive:  Alert and Appropriate  Insight: Appropriate  Engagement in Group:  Developing/Improving  Modes of Intervention:  Discussion  Additional Comments:     Estellar Cadena 01/29/2023, 8:35 PM

## 2023-01-29 NOTE — Plan of Care (Signed)
Problem: Activity: Goal: Risk for activity intolerance will decrease Outcome: Progressing   Problem: Clinical Measurements: Goal: Ability to maintain clinical measurements within normal limits will improve Outcome: Progressing

## 2023-01-29 NOTE — Progress Notes (Signed)
Patient alert and oriented x 4, thoughts are organized and coherent no distress noted, she expressed earlier she was angry and upset that her husband would not bring her belongings to her so she can go the shelter. Patient explained that she and her husband her going through some marital issues and he has file for divorce. 15 minutes safety checks maintained will continue to monitor.

## 2023-01-29 NOTE — Group Note (Signed)
LCSW Group Therapy Note  Group Date: 01/29/2023 Start Time: 1315 End Time: 1355   Type of Therapy and Topic:   Group Therapy - Coping Skills  Participation Level:  Active   Description of Group The focus of this group was to determine what unhealthy coping techniques typically are used by group members and what healthy coping techniques would be helpful in coping with various problems. Patients were guided in becoming aware of the differences between healthy and unhealthy coping techniques. Patients were asked to identify 2-3 healthy coping skills they would like to learn to use more effectively.  Therapeutic Goals Patients learned that coping is what human beings do all day long to deal with various situations in their lives Patients defined and discussed healthy vs unhealthy coping techniques Patients identified their preferred coping techniques and identified whether these were healthy or unhealthy Patients determined 2-3 healthy coping skills they would like to become more familiar with and use more often. Patients provided support and ideas to each other   Summary of Patient Progress:  The patient attended group.  Patient proved open to input from peers and feedback from [facility]. Patient demonstrated insight into the subject matter, was respectful of peers, and participated throughout the entire session. The patient participated during today's icebreaker. The patient she has coping skills she uses that helps her, but she wants to know more to be able to use in different situations. The patient stated that she likes to ride her bike.      Marshell Levan, LCSWA 01/29/2023  2:12 PM

## 2023-01-29 NOTE — Group Note (Signed)
Date:  01/29/2023 Time:  5:34 PM  Group Topic/Focus:  Outdoor recreation Art therapy : help patients express themselves more freely, improve their mental health, and improve interpersonal relationships.    Participation Level:  Active  Participation Quality:  Appropriate and Attentive  Affect:  Appropriate  Cognitive:  Alert, Appropriate, and Oriented  Insight: Appropriate  Engagement in Group:  Developing/Improving and Engaged  Modes of Intervention:  Activity, Discussion, and Socialization  Additional Comments:    Rosaura Carpenter 01/29/2023, 5:34 PM

## 2023-01-29 NOTE — Progress Notes (Signed)
[facility] [place] MD Progress Note  01/29/2023 9:08 AM Melanie Bell  MRN:  [id number]  Subjective:  Pt chart, notes, labs, and vital signs reviewed.  The client denied depression with "none", anxiety was "just slightly".  Sleep and appetite were "good".  Denies suicidal ideations.  She does report some sleepiness after her Abilify was increased.  Lennis feels she is ready to discharge tomorrow and [facility] ACT team will manage her care with daily visits.  Principal Problem: Schizoaffective disorder, depressive type (HCC)  Diagnosis: Principal Problem:   Schizoaffective disorder, depressive type (HCC) Active Problems:   Suicide attempt (HCC)   Schizoaffective disorder, bipolar type (HCC)  Total Time spent with patient:  25 minutes  Past Psychiatric History: Reported history of bipolar disorder vs schizoaffective disorder  Past Medical History:  Past Medical History:  Diagnosis Date   Allergy    Anxiety    Arthritis    Asthma    Depression    Foreign body aspiration    GERD (gastroesophageal reflux disease)    Hallucinations 09/30/2014   Sizoaffective   Hyperlipidemia    Hypertension    Hypoglycemia    Intentional ingestion of batteries 04/21/2016    2 AAA batteries and 1 thumb tack; intent to hurt herself/notes 04/21/2016   Seizure (HCC) 01/16/2023   Self-inflicted injury 11/23/2017   Suicidal ideation 01/02/2018   Tardive dyskinesia 10/2014   recent onset   Thyroid disease     Past Surgical History:  Procedure Laterality Date   ABDOMINAL SURGERY     "years ago" to remove foreign objects   APPENDECTOMY     BREAST EXCISIONAL BIOPSY Right 09/03/2007   surgical bx removed fibroadeoma   BREAST LUMPECTOMY Right    CHOLECYSTECTOMY  02/2022   COLONOSCOPY WITH PROPOFOL N/A 09/10/2015   Procedure: COLONOSCOPY WITH PROPOFOL;  Surgeon: Christena Deem, MD;  Location: [facility] ENDOSCOPY;  Service: Endoscopy;  Laterality: N/A;   ESOPHAGOGASTRODUODENOSCOPY N/A 11/28/2014   Procedure:  ESOPHAGOGASTRODUODENOSCOPY (EGD);  Surgeon: Scot Jun, MD;  Location: [facility] ENDOSCOPY;  Service: Endoscopy;  Laterality: N/A;   ESOPHAGOGASTRODUODENOSCOPY N/A 02/21/2016   Procedure: ESOPHAGOGASTRODUODENOSCOPY (EGD);  Surgeon: Napoleon Form, MD;  Location: [facility] ENDOSCOPY;  Service: Endoscopy;  Laterality: N/A;   ESOPHAGOGASTRODUODENOSCOPY N/A 04/21/2016   Procedure: ESOPHAGOGASTRODUODENOSCOPY (EGD);  Surgeon: Iva Boop, MD;  Location: [facility] ENDOSCOPY;  Service: Endoscopy;  Laterality: N/A;   ESOPHAGOGASTRODUODENOSCOPY N/A 05/06/2016   Procedure: ESOPHAGOGASTRODUODENOSCOPY (EGD);  Surgeon: Wyline Mood, MD;  Location: [facility] ENDOSCOPY;  Service: Gastroenterology;  Laterality: N/A;   ESOPHAGOGASTRODUODENOSCOPY N/A 06/13/2016   Procedure: ESOPHAGOGASTRODUODENOSCOPY (EGD);  Surgeon: Midge Minium, MD;  Location: [facility] ENDOSCOPY;  Service: Endoscopy;  Laterality: N/A;   ESOPHAGOGASTRODUODENOSCOPY N/A 01/14/2022   Procedure: ESOPHAGOGASTRODUODENOSCOPY (EGD);  Surgeon: Toledo, Boykin Nearing, MD;  Location: [facility] ENDOSCOPY;  Service: Gastroenterology;  Laterality: N/A;   ESOPHAGOGASTRODUODENOSCOPY (EGD) WITH PROPOFOL N/A 02/29/2016   Procedure: ESOPHAGOGASTRODUODENOSCOPY (EGD) WITH PROPOFOL;  Surgeon: Midge Minium, MD;  Location: [facility] ENDOSCOPY;  Service: Endoscopy;  Laterality: N/A;   ESOPHAGOGASTRODUODENOSCOPY (EGD) WITH PROPOFOL N/A 01/15/2018   Procedure: ESOPHAGOGASTRODUODENOSCOPY (EGD) WITH PROPOFOL;  Surgeon: Corbin Ade, MD;  Location: [facility] ENDO SUITE;  Service: Endoscopy;  Laterality: N/A;  retrieval forein body   LAPAROSCOPIC LYSIS OF ADHESIONS  02/16/2022   Procedure: LAPAROSCOPIC LYSIS OF ADHESIONS;  Surgeon: Henrene Dodge, MD;  Location: [facility] ORS;  Service: General;;   LAPAROTOMY N/A 09/12/2015   Procedure: EXPLORATORY LAPAROTOMY;  Surgeon: Lattie Haw, MD;  Location: [facility] ORS;  Service: General;  Laterality: N/A;   SIGMOIDOSCOPY N/A 09/12/2015   Procedure: SIGMOIDOSCOPY;  Surgeon:  Lattie Haw, MD;  Location: [facility] ORS;  Service: General;  Laterality: N/A;   WISDOM TOOTH EXTRACTION     Family History:  Family History  Problem Relation Age of Onset   Depression Mother    Hypertension Mother    Sleep apnea Mother    Asthma Mother    COPD Mother    Diabetes Mother    Anxiety disorder Mother    Arthritis Mother    Heart failure Mother    Breast cancer Maternal Aunt        20's   Stroke Maternal Grandmother    Heart attack Maternal Grandfather    Family Psychiatric  History: Grandmother - schizophrenia, biological father - schizophrenia, mother - depression, anxiety, sister - depression, anxiety, brother - adhd Social History:  Social History   Substance and Sexual Activity  Alcohol Use No     Social History   Substance and Sexual Activity  Drug Use No    Social History   Socioeconomic History   Marital status: Married    Spouse name: Not on file   Number of children: 0   Years of education: 12   Highest education level: Not on file  Occupational History   Occupation: unemployed  Tobacco Use   Smoking status: Former    Current packs/day: 0.00    Average packs/day: 0.5 packs/day for 3.0 years (1.5 ttl pk-yrs)    Types: Cigarettes    Start date: 08/14/2013    Quit date: 08/14/2016    Years since quitting: 6.4    Passive exposure: Past   Smokeless tobacco: Never  Vaping Use   Vaping status: Never Used  Substance and Sexual Activity   Alcohol use: No   Drug use: No   Sexual activity: Yes    Partners: Male    Birth control/protection: None  Other Topics Concern   Not on file  Social History Narrative   Right handed    One story home   Drinks occasional caffeine   Social Determinants of Health   Financial Resource Strain: Low Risk  (01/11/2023)   Overall Financial Resource Strain (CARDIA)    Difficulty of Paying Living Expenses: Not very hard  Food Insecurity: Food Insecurity Present (01/24/2023)   Hunger Vital Sign    Worried About  Running Out of Food in the Last Year: Sometimes true    Ran Out of Food in the Last Year: Sometimes true  Transportation Needs: No Transportation Needs (01/24/2023)   PRAPARE - Administrator, Civil Service (Medical): No    Lack of Transportation (Non-Medical): No  Physical Activity: Sufficiently Active (01/11/2023)   Exercise Vital Sign    Days of Exercise per Week: 5 days    Minutes of Exercise per Session: 30 min  Recent Concern: Physical Activity - Insufficiently Active (12/27/2022)   Exercise Vital Sign    Days of Exercise per Week: 7 days    Minutes of Exercise per Session: 20 min  Stress: No Stress Concern Present (01/11/2023)   [employer] of Occupational Health - Occupational Stress Questionnaire    Feeling of Stress : Not at all  Social Connections: Moderately Integrated (01/11/2023)   Social Connection and Isolation Panel [NHANES]    Frequency of Communication with Friends and Family: More than three times a week    Frequency of Social Gatherings with Friends and Family: Once a week    Attends  Religious Services: More than 4 times per year    Active Member of Clubs or Organizations: No    Attends Banker Meetings: Never    Marital Status: Married   Sleep:  Good  Appetite: Good  Current Medications: Current Facility-Administered Medications  Medication Dose Route Frequency Provider Last Rate Last Admin   albuterol (VENTOLIN HFA) 108 (90 Base) MCG/ACT inhaler 2 puff  2 puff Inhalation Q6H PRN Bennett, Christal H, NP       ARIPiprazole (ABILIFY) tablet 7.5 mg  7.5 mg Oral Daily Maryagnes Amos, FNP   7.5 mg at 01/29/23 0834   furosemide (LASIX) tablet 20 mg  20 mg Oral Daily Bennett, Christal H, NP   20 mg at 01/29/23 1610   hydrocerin (EUCERIN) cream   Topical BID PRN Lauree Chandler, NP       hydrOXYzine (ATARAX) tablet 50 mg  50 mg Oral TID PRN Willeen Cass, Christal H, NP   50 mg at 01/28/23 2125   levothyroxine (SYNTHROID) tablet 75  mcg  75 mcg Oral QAC breakfast Lauree Chandler, NP   75 mcg at 01/29/23 0600   magnesium hydroxide (MILK OF MAGNESIA) suspension 30 mL  30 mL Oral Daily PRN Bennett, Christal H, NP       pantoprazole (PROTONIX) EC tablet 40 mg  40 mg Oral Daily Bennett, Christal H, NP   40 mg at 01/29/23 0833   traZODone (DESYREL) tablet 50 mg  50 mg Oral QHS PRN Bennett, Christal H, NP   50 mg at 01/28/23 2125   vitamin B-12 (CYANOCOBALAMIN) tablet 100 mcg  100 mcg Oral Daily Bennett, Christal H, NP   100 mcg at 01/29/23 9604    Lab Results:  No results found for this or any previous visit (from the past 48 hour(s)).   Blood Alcohol level:  Lab Results  Component Value Date   ETH <10 01/20/2023   ETH <10 11/07/2022    Metabolic Disorder Labs: Lab Results  Component Value Date   HGBA1C 5.4 01/26/2023   MPG 108 01/26/2023   MPG 99.67 11/23/2017   Lab Results  Component Value Date   PROLACTIN 3.1 05/02/2022   PROLACTIN 17.2 11/01/2021   Lab Results  Component Value Date   CHOL 153 01/26/2023   TRIG 48 01/26/2023   HDL 40 (L) 01/26/2023   CHOLHDL 3.8 01/26/2023   VLDL 10 01/26/2023   LDLCALC 103 (H) 01/26/2023   LDLCALC 120 (H) 11/11/2022      Musculoskeletal: Strength & Muscle Tone: within normal limits Gait & Station: normal Patient leans: N/A  Psychiatric Specialty Exam: Physical Exam Vitals and nursing note reviewed.  Constitutional:      General: She is not in acute distress.    Appearance: She is not ill-appearing, toxic-appearing or diaphoretic.  HENT:     Head: Normocephalic.  Eyes:     General: No scleral icterus. Pulmonary:     Effort: Pulmonary effort is normal. No respiratory distress.  Musculoskeletal:        General: Normal range of motion.     Cervical back: Normal range of motion.  Neurological:     General: No focal deficit present.     Mental Status: She is alert and oriented to person, place, and time.  Psychiatric:        Attention and  Perception: Attention and perception normal.        Mood and Affect: Mood normal. Affect is blunt.  Speech: Speech normal.        Behavior: Behavior normal. Behavior is cooperative.        Thought Content: Thought content normal.     Review of Systems  Constitutional:  Negative for chills and fever.  Respiratory:  Negative for shortness of breath.   Cardiovascular:  Negative for chest pain and palpitations.  Gastrointestinal:  Negative for abdominal pain.  Neurological:  Negative for headaches.  Psychiatric/Behavioral:  The patient is nervous/anxious.   All other systems reviewed and are negative.   Blood pressure (!) 115/58, pulse 78, temperature 98.7 F (37.1 C), temperature source Oral, resp. rate 17, height 5\' 8"  (1.727 m), weight 124.3 kg, last menstrual period 01/13/2023, SpO2 100%, unknown if currently breastfeeding.Body mass index is 41.66 kg/m.  General Appearance: Casual  Eye Contact:  Good  Speech:  Normal Rate  Volume:  Normal  Mood:  Anxiety "just slightly"  Affect:  Congruent  Thought Process:  Coherent  Orientation:  Full (Time, Place, and Person)  Thought Content:  WDL and Logical  Suicidal Thoughts:  No  Homicidal Thoughts:  No  Memory:  Immediate;   Good Recent;   Good Remote;   Good  Judgement:  Fair  Insight:  Fair  Psychomotor Activity:  Normal  Concentration:  Concentration: Good and Attention Span: Good  Recall:  Good  Fund of Knowledge:  Good  Language:  Good  Akathisia:  No  Handed:  Right  AIMS (if indicated):     Assets:  Leisure Time Physical Health Resilience Social Support  ADL's:  Intact  Cognition:  WNL  Sleep:        Physical Exam: Physical Exam Vitals and nursing note reviewed.  Constitutional:      General: She is not in acute distress.    Appearance: She is not ill-appearing, toxic-appearing or diaphoretic.  HENT:     Head: Normocephalic.  Eyes:     General: No scleral icterus. Pulmonary:     Effort: Pulmonary  effort is normal. No respiratory distress.  Musculoskeletal:        General: Normal range of motion.     Cervical back: Normal range of motion.  Neurological:     General: No focal deficit present.     Mental Status: She is alert and oriented to person, place, and time.  Psychiatric:        Attention and Perception: Attention and perception normal.        Mood and Affect: Mood normal. Affect is blunt.        Speech: Speech normal.        Behavior: Behavior normal. Behavior is cooperative.        Thought Content: Thought content normal.    Review of Systems  Constitutional:  Negative for chills and fever.  Respiratory:  Negative for shortness of breath.   Cardiovascular:  Negative for chest pain and palpitations.  Gastrointestinal:  Negative for abdominal pain.  Neurological:  Negative for headaches.  Psychiatric/Behavioral:  The patient is nervous/anxious.   All other systems reviewed and are negative.  Blood pressure (!) 115/58, pulse 78, temperature 98.7 F (37.1 C), temperature source Oral, resp. rate 17, height 5\' 8"  (1.727 m), weight 124.3 kg, last menstrual period 01/13/2023, SpO2 100%, unknown if currently breastfeeding. Body mass index is 41.66 kg/m.  Treatment Plan Summary: Daily contact with patient to assess and evaluate symptoms and progress in treatment, Medication management, and Plan    Schizoaffective disorder, bipolar type -abilify 7.5mg  oral  daily  Agitation protocol -benadryl 50mg  oral or IM 2 times daily PRN agitation -haldol 5mg  oral or IM 2 times daily PRN agitation -ativan 2mg  oral or IM 2 times daily PRN agitation  Insomnia -trazodone 50mg  oral at bedtime PRN sleep  Anxiety -hydroxyzine 50mg  oral 3 times daily PRN anxiety  Nanine Means, NP 01/29/2023, 9:08 AM

## 2023-01-29 NOTE — Plan of Care (Signed)
Problem: Activity: Goal: Risk for activity intolerance will decrease Outcome: Progressing   Problem: Coping: Goal: Level of anxiety will decrease Outcome: Progressing

## 2023-01-30 MED ORDER — FUROSEMIDE 20 MG PO TABS: 20.0000 mg | ORAL_TABLET | Freq: Every day | ORAL | 0 refills | Status: DC

## 2023-01-30 NOTE — [facility] Suicide Risk Assessment (Signed)
[facility] Discharge Suicide Risk Assessment   Principal Problem: Schizoaffective disorder, depressive type [facility]) Discharge Diagnoses: Principal Problem:   Schizoaffective disorder, depressive type (HCC) Active Problems:   Suicide attempt (HCC)   Total Time spent with patient: 45 minutes  Musculoskeletal: Strength & Muscle Tone: within normal limits Gait & Station: normal Patient leans: N/A  Psychiatric Specialty Exam: Physical Exam Vitals and nursing note reviewed.  Constitutional:      Appearance: Normal appearance.  HENT:     Head: Normocephalic.     Nose: Nose normal.  Pulmonary:     Effort: Pulmonary effort is normal.  Musculoskeletal:        General: Normal range of motion.     Cervical back: Normal range of motion.  Neurological:     General: No focal deficit present.     Mental Status: She is alert and oriented to person, place, and time.     Review of Systems  Constitutional: Negative.   Psychiatric/Behavioral:  The patient is nervous/anxious.   All other systems reviewed and are negative.   Blood pressure 116/68, pulse 79, temperature 98.5 F (36.9 C), temperature source Oral, resp. rate 17, height 5\' 8"  (1.727 m), weight 124.3 kg, last menstrual period 01/13/2023, SpO2 100%, unknown if currently breastfeeding.Body mass index is 41.66 kg/m.  General Appearance: Casual  Eye Contact:  Good  Speech:  Clear and Coherent  Volume:  Normal  Mood:  Anxious  Affect:  Congruent  Thought Process:  Coherent and Descriptions of Associations: Intact  Orientation:  Full (Time, Place, and Person)  Thought Content:  WDL  Suicidal Thoughts:  No  Homicidal Thoughts:  No  Memory:  Immediate;   Good Recent;   Good Remote;   Good  Judgement:  Fair  Insight:  Fair  Psychomotor Activity:  Normal  Concentration:  Concentration: Good and Attention Span: Good  Recall:  Good  Fund of Knowledge:  Good  Language:  Good  Akathisia:  No  Handed:  Right  AIMS (if indicated):      Assets:  Communication Skills Leisure Time Physical Health Resilience Social Support  ADL's:  Intact  Cognition:  WNL  Sleep:        Physical Exam: Physical Exam Vitals and nursing note reviewed.  Constitutional:      Appearance: Normal appearance.  HENT:     Head: Normocephalic.     Nose: Nose normal.  Pulmonary:     Effort: Pulmonary effort is normal.  Musculoskeletal:        General: Normal range of motion.     Cervical back: Normal range of motion.  Neurological:     General: No focal deficit present.     Mental Status: She is alert and oriented to person, place, and time.    Review of Systems  Constitutional: Negative.   Psychiatric/Behavioral:  The patient is nervous/anxious.   All other systems reviewed and are negative.  Blood pressure 116/68, pulse 79, temperature 98.5 F (36.9 C), temperature source Oral, resp. rate 17, height 5\' 8"  (1.727 m), weight 124.3 kg, last menstrual period 01/13/2023, SpO2 100%, unknown if currently breastfeeding. Body mass index is 41.66 kg/m.  Mental Status Per Nursing Assessment::   On Admission:  NA  Demographic Factors:  Caucasian  Loss Factors: NA  Historical Factors: Prior suicide attempts and Impulsivity  Risk Reduction Factors:   Positive therapeutic relationship and Positive coping skills or problem solving skills  Continued Clinical Symptoms:  Mild anxiety  Cognitive Features That Contribute  To Risk:  None    Suicide Risk:  Minimal: No identifiable suicidal ideation.  Patients presenting with no risk factors but with morbid ruminations; may be classified as minimal risk based on the severity of the depressive symptoms   Follow-up Information     [facility] Schedule an appointment as soon as possible for a visit.   Why: ACTT services will be meeting you at the house. Be sure to ask them about follow up appointments. Thanks! Contact information: [address]  [place] [address] [phone]                 Plan Of Care/Follow-up recommendations:  Activity:  as tolerated Diet:  heart healthy  Schizoaffective disorder, bipolar type Abilify 7.5mg   by mouth daily    [name], NP 01/30/2023, 1:11 PM

## 2023-01-30 NOTE — BHH Counselor (Signed)
CSW met with pt regarding discharge/aftercare plans. Pt shares that she is fine with going to the [facility] but will need to go home first. She also acknowledged that she needs transportation to get there. CSW shared that team could assist her with this, however, unfortunately could not get her to both the house (address in chart) and then to the [facility]. She voiced understanding and shared that her ACT team will be meeting her at the house like an escort basically. CSW voiced understanding. CSW informed her that he would contact the team lead to see about if they were going to take her to the [facility] or if the CSW team would need to look into other options. Pt in agreement. She is wearing clothes for discharge at time of interaction. She denied any use of tobacco/nicotine or substance use and declined any need for referral for cessation services for either. Pt and CSW discussed the weekend. She shared that she had gotten upset this weekend but utilized her skills to keep herself from spiraling. Pt also shared that she had asked her mother about setting up a new bank account and had been informed about [employer] bank account (which allows you to set up an account without an initial deposit. Pt and CSW discussed this more deeply. Pt was congratulated on the work done while in treatment and encouraged to continue to utilize her coping skills and lean on her support system when necessary. She agreed. CSW informed her that he would be back with transportation waiver for her to sign and would also contact ACT team lead, Liborio Nixon about transportation. No other concerns expressed. Contact ended without incident.   CSW sent text to [facility] ACT team lead, Liborio Nixon ([phone]) regarding transportation.   CSW was updated by team that transportation from residence to the [facility] by ACT team had already been confirmed.   CSW thanked ACT team lead for their time via text.   CSW added follow up information as it had also been confirmed by  other member of the time. [facility] to see pt daily while she finds stability with her change in housing status.   CSW met with pt briefly to update regarding contacts and transportation. She was also notified that resource guides for both [place] and [employer] had been added to her discharge paperwork. Pt voiced acceptance of this information. No other concerns expressed. Contact ended without incident.   Melanie Bell. Melanie Bell, MSW, LCSW, LCAS 01/30/2023 4:20 PM

## 2023-01-30 NOTE — Plan of Care (Signed)
  Problem: Education: Goal: Knowledge of General Education information will improve Description: Including pain rating scale, medication(s)/side effects and non-pharmacologic comfort measures Outcome: Adequate for Discharge   Problem: Health Behavior/Discharge Planning: Goal: Ability to manage health-related needs will improve Outcome: Adequate for Discharge   Problem: Clinical Measurements: Goal: Ability to maintain clinical measurements within normal limits will improve Outcome: Adequate for Discharge Goal: Cardiovascular complication will be avoided Outcome: Adequate for Discharge   Problem: Activity: Goal: Risk for activity intolerance will decrease Outcome: Adequate for Discharge   Problem: Nutrition: Goal: Adequate nutrition will be maintained Outcome: Adequate for Discharge   Problem: Coping: Goal: Level of anxiety will decrease Outcome: Adequate for Discharge   Problem: Elimination: Goal: Will not experience complications related to bowel motility Outcome: Adequate for Discharge   Problem: Pain Managment: Goal: General experience of comfort will improve Outcome: Adequate for Discharge   Problem: Safety: Goal: Ability to remain free from injury will improve Outcome: Adequate for Discharge   Problem: Skin Integrity: Goal: Risk for impaired skin integrity will decrease Outcome: Adequate for Discharge   Problem: Education: Goal: Knowledge of [place] [facility] Education information/materials will improve Outcome: Adequate for Discharge Goal: Emotional status will improve Outcome: Adequate for Discharge Goal: Mental status will improve Outcome: Adequate for Discharge Goal: Verbalization of understanding the information provided will improve Outcome: Adequate for Discharge   Problem: Activity: Goal: Interest or engagement in activities will improve Outcome: Adequate for Discharge Goal: Sleeping patterns will improve Outcome: Adequate for Discharge    Problem: Coping: Goal: Ability to verbalize frustrations and anger appropriately will improve Outcome: Adequate for Discharge Goal: Ability to demonstrate self-control will improve Outcome: Adequate for Discharge   Problem: Health Behavior/Discharge Planning: Goal: Identification of resources available to assist in meeting health care needs will improve Outcome: Adequate for Discharge Goal: Compliance with treatment plan for underlying cause of condition will improve Outcome: Adequate for Discharge   Problem: Physical Regulation: Goal: Ability to maintain clinical measurements within normal limits will improve Outcome: Adequate for Discharge   Problem: Safety: Goal: Periods of time without injury will increase Outcome: Adequate for Discharge   Problem: Education: Goal: Ability to make informed decisions regarding treatment will improve Outcome: Adequate for Discharge   Problem: Health Behavior/Discharge Planning: Goal: Identification of resources available to assist in meeting health care needs will improve Outcome: Adequate for Discharge   Problem: Self-Concept: Goal: Ability to disclose and discuss suicidal ideas will improve Outcome: Adequate for Discharge Goal: Will verbalize positive feelings about self Outcome: Adequate for Discharge   Problem: Self-Concept: Goal: Ability to identify factors that promote anxiety will improve Outcome: Adequate for Discharge Goal: Level of anxiety will decrease Outcome: Adequate for Discharge Goal: Ability to modify response to factors that promote anxiety will improve Outcome: Adequate for Discharge

## 2023-01-30 NOTE — Progress Notes (Signed)
  [facility] Adult Case Management Discharge Plan :  Will you be returning to the same living situation after discharge:  Yes,  pt plans to return to her previous residence to get her things (clothes, photo ID, social security card, and personal items) prior to going to [facility]. At discharge, do you have transportation home?: Yes,  pt given taxi voucher for transportation. ACTT will be meeting her at home.  Do you have the ability to pay for your medications: Yes,  [employer].  Release of information consent forms completed and in the chart;  Patient's signature needed at discharge.  Patient to Follow up at:  Follow-up Information     [address] [facility] Schedule an appointment as soon as possible for a visit.   Why: ACTT services will be meeting you at the house. They will be helping you get to the [facility] and will see you daily after today until they say otherwise. Thanks! Keep using your coping skills and changing those thought patterns! Contact information: [address] [place] [address] [phone]                 Next level of care provider has access to [facility] Link:no  Safety Planning and Suicide Prevention discussed: Yes,  SPE completed with pt. Pamphlet attached to discharge paperwork.     Has patient been referred to the Quitline?: Patient refused referral for treatment  Patient has been referred for addiction treatment: No known substance use disorder.  Glenis Smoker, LCSW 01/30/2023, 4:20 PM

## 2023-01-30 NOTE — Group Note (Signed)
Recreation Therapy Group Note   Group Topic:Goal Setting  Group Date: 01/30/2023 Start Time: 1000 End Time: 1100 Facilitators: Rosina Lowenstein, LRT, CTRS Location:  Craft Room  Group Description: Scientist, research (physical sciences). Patients were given many different magazines, a glue stick, markers, and a piece of cardstock paper. LRT and pts discussed the importance of having goals in life. LRT and pts discussed the difference between short-term and long-term goals, as well as what a SMART goal is. LRT encouraged pts to create a vision board, with images they picked and then cut out with safety scissors from the magazine, for themselves, that capture their short and long-term goals. LRT encouraged pts to show and explain their vision board to the group.   Goal Area(s) Addressed:  Patient will gain knowledge of short vs. long term goals.  Patient will identify goals for themselves. Patient will practice setting SMART goals. Patient will verbalize their goals to LRT and peers.   Affect/Mood: Appropriate   Participation Level: Active and Engaged   Participation Quality: Independent   Behavior: Appropriate, Calm, and Cooperative   Speech/Thought Process: Coherent   Insight: Good   Judgement: Good   Modes of Intervention: Activity   Patient Response to Interventions:  Attentive, Engaged, Interested , and Receptive   Education Outcome:  Acknowledges education   Clinical Observations/Individualized Feedback: Tyshira was active in their participation of session activities and group discussion. Pt identified "I want to get a place of my own eventually  and I want to better my physical and mental health" as her goals. Pt appropriately identified images ro reflect these goals. Pt interacted well with LRT and peers duration of session.   Plan: Continue to engage patient in RT group sessions 2-3x/week.   Rosina Lowenstein, LRT, CTRS 01/30/2023 11:46 AM

## 2023-01-30 NOTE — BH IP Treatment Plan (Addendum)
Interdisciplinary Treatment and Diagnostic Plan Update  01/30/2023 Time of Session: 10:00 Melanie Bell MRN: [id number]  Principal Diagnosis: Schizoaffective disorder, depressive type (HCC)  Secondary Diagnoses: Principal Problem:   Schizoaffective disorder, depressive type (HCC) Active Problems:   Suicide attempt (HCC)   Current Medications:  Current Facility-Administered Medications  Medication Dose Route Frequency Provider Last Rate Last Admin   acetaminophen (TYLENOL) tablet 650 mg  650 mg Oral Q6H PRN [name], DO   650 mg at 01/29/23 1840   albuterol (VENTOLIN HFA) 108 (90 Base) MCG/ACT inhaler 2 puff  2 puff Inhalation Q6H PRN Bennett, Christal H, NP       ARIPiprazole (ABILIFY) tablet 7.5 mg  7.5 mg Oral Daily Maryagnes Amos, FNP   7.5 mg at 01/30/23 0841   furosemide (LASIX) tablet 20 mg  20 mg Oral Daily Bennett, Christal H, NP   20 mg at 01/30/23 0841   hydrocerin (EUCERIN) cream   Topical BID PRN Lauree Chandler, NP       hydrOXYzine (ATARAX) tablet 50 mg  50 mg Oral TID PRN Thurston Hole H, NP   50 mg at 01/28/23 2125   levothyroxine (SYNTHROID) tablet 75 mcg  75 mcg Oral QAC breakfast Lauree Chandler, NP   75 mcg at 01/30/23 7829   magnesium hydroxide (MILK OF MAGNESIA) suspension 30 mL  30 mL Oral Daily PRN Bennett, Christal H, NP       pantoprazole (PROTONIX) EC tablet 40 mg  40 mg Oral Daily Bennett, Christal H, NP   40 mg at 01/30/23 0841   traZODone (DESYREL) tablet 50 mg  50 mg Oral QHS PRN Willeen Cass, Christal H, NP   50 mg at 01/28/23 2125   vitamin B-12 (CYANOCOBALAMIN) tablet 100 mcg  100 mcg Oral Daily Bennett, Christal H, NP   100 mcg at 01/30/23 0840   PTA Medications: Medications Prior to Admission  Medication Sig Dispense Refill Last Dose   albuterol (VENTOLIN HFA) 108 (90 Base) MCG/ACT inhaler 2 puffs four times a day as needed (Patient taking differently: Inhale 2 puffs into the lungs every 8 (eight) hours as needed  for wheezing or shortness of breath.) 18 g 1    ARIPiprazole (ABILIFY) 5 MG tablet Take 1 tablet (5 mg total) by mouth daily. 30 tablet 3    cabergoline (DOSTINEX) 0.5 MG tablet Take 1 tablet (0.5 mg total) by mouth once a week. 12 tablet 3    Cyanocobalamin (VITAMIN B-12 PO) Take 1 capsule by mouth daily.      levothyroxine (SYNTHROID) 75 MCG tablet Take 1 tablet (75 mcg total) by mouth daily before breakfast. 90 tablet 3    Magnesium 500 MG CAPS Take 500 mg by mouth at bedtime.      omeprazole (PRILOSEC) 20 MG capsule TAKE 1 CAPSULE (20 MG TOTAL) BY MOUTH DAILY. 90 capsule 2    ondansetron (ZOFRAN-ODT) 4 MG disintegrating tablet Take 1 tablet (4 mg total) by mouth every 8 (eight) hours as needed. (Patient taking differently: Take 4 mg by mouth as needed for nausea or vomiting.) 20 tablet 0    SUMAtriptan (IMITREX) 20 MG/ACT nasal spray Place 20 mg into the nose as needed for migraine or headache.      SUMAtriptan (IMITREX) 25 MG tablet Take 1 tablet (25 mg total) by mouth every 2 (two) hours as needed for migraine or headache. May repeat in 2 hours if headache persists or recurs.      traZODone (DESYREL) 50  MG tablet Take 1 tablet (50 mg total) by mouth at bedtime as needed for sleep. 30 tablet 3    [DISCONTINUED] furosemide (LASIX) 20 MG tablet Take 1 tablet (20 mg total) by mouth daily. 30 tablet 3     Patient Stressors: Financial difficulties   Marital or family conflict    Patient Strengths: Careers information officer for treatment/growth   Treatment Modalities: Medication Management, Group therapy, Case management,  1 to 1 session with clinician, Psychoeducation, Recreational therapy.   Physician Treatment Plan for Primary Diagnosis: Schizoaffective disorder, depressive type (HCC) Long Term Goal(s): Improvement in symptoms so as ready for discharge   Short Term Goals: Ability to identify changes in lifestyle to reduce recurrence of condition will  improve Ability to verbalize feelings will improve Ability to demonstrate self-control will improve Ability to identify and develop effective coping behaviors will improve Ability to identify triggers associated with substance abuse/mental health issues will improve  Medication Management: Evaluate patient's response, side effects, and tolerance of medication regimen.  Therapeutic Interventions: 1 to 1 sessions, Unit Group sessions and Medication administration.  Evaluation of Outcomes: Adequate for Discharge  Physician Treatment Plan for Secondary Diagnosis: Principal Problem:   Schizoaffective disorder, depressive type (HCC) Active Problems:   Suicide attempt [facility])  Long Term Goal(s): Improvement in symptoms so as ready for discharge   Short Term Goals: Ability to identify changes in lifestyle to reduce recurrence of condition will improve Ability to verbalize feelings will improve Ability to demonstrate self-control will improve Ability to identify and develop effective coping behaviors will improve Ability to identify triggers associated with substance abuse/mental health issues will improve     Medication Management: Evaluate patient's response, side effects, and tolerance of medication regimen.  Therapeutic Interventions: 1 to 1 sessions, Unit Group sessions and Medication administration.  Evaluation of Outcomes: Adequate for Discharge   RN Treatment Plan for Primary Diagnosis: Schizoaffective disorder, depressive type (HCC) Long Term Goal(s): Knowledge of disease and therapeutic regimen to maintain health will improve  Short Term Goals: Ability to remain free from injury will improve, Ability to verbalize frustration and anger appropriately will improve, Ability to demonstrate self-control, Ability to participate in decision making will improve, Ability to verbalize feelings will improve, Ability to disclose and discuss suicidal ideas, Ability to identify and develop  effective coping behaviors will improve, and Compliance with prescribed medications will improve  Medication Management: RN will administer medications as ordered by provider, will assess and evaluate patient's response and provide education to patient for prescribed medication. RN will report any adverse and/or side effects to prescribing provider.  Therapeutic Interventions: 1 on 1 counseling sessions, Psychoeducation, Medication administration, Evaluate responses to treatment, Monitor vital signs and CBGs as ordered, Perform/monitor CIWA, COWS, AIMS and Fall Risk screenings as ordered, Perform wound care treatments as ordered.  Evaluation of Outcomes: Adequate for Discharge   LCSW Treatment Plan for Primary Diagnosis: Schizoaffective disorder, depressive type (HCC) Long Term Goal(s): Safe transition to appropriate next level of care at discharge, Engage patient in therapeutic group addressing interpersonal concerns.  Short Term Goals: Engage patient in aftercare planning with referrals and resources, Increase social support, Increase ability to appropriately verbalize feelings, Increase emotional regulation, Facilitate acceptance of mental health diagnosis and concerns, and Increase skills for wellness and recovery  Therapeutic Interventions: Assess for all discharge needs, 1 to 1 time with Social worker, Explore available resources and support systems, Assess for adequacy in community support network, Educate family and  significant other(s) on suicide prevention, Complete Psychosocial Assessment, Interpersonal group therapy.  Evaluation of Outcomes: Adequate for Discharge   Progress in Treatment: Attending groups: Yes. Participating in groups: Yes. Taking medication as prescribed: Yes. Toleration medication: Yes. Family/Significant other contact made: No, will contact:  if given permission as pt has declined collateral/familial contact. Patient understands diagnosis: Yes. Discussing  patient identified problems/goals with staff: Yes. Medical problems stabilized or resolved: Yes. Denies suicidal/homicidal ideation: Yes. Issues/concerns per patient self-inventory: Yes. Other: none.  New problem(s) identified: No, Describe:  none identified.   New Short Term/Long Term Goal(s): medication management for mood stabilization; elimination of SI thoughts; development of comprehensive mental wellness plan. Update 01/30/23: Discharge is set for today.     Patient Goals:  "I just want to get myself together so I can heal and move on." Update 01/30/23: To discharge to house to get her things and meet [facility] ACTT team representative there.     Discharge Plan or Barriers: CSW will assist pt with development of an appropriate aftercare/discharge plan. Update 01/30/23: Pt is meeting her ACT team at her previous home. They will be transporting her to the [facility] and seeing her daily until pt gains some stability outside of hospital setting.    Reason for Continuation of Hospitalization: Depression Medication stabilization Suicidal ideation   Estimated Length of Stay: 1-7 days Update 01/30/23: No changes at this time.   Last 3 [place] Suicide Severity Risk Score: Flowsheet Row Admission (Current) from 01/24/2023 in [facility] ED to Hosp-Admission (Discharged) from 01/20/2023 in [facility] (1A) Admission (Discharged) from 01/16/2023 in [facility]  C-SSRS RISK CATEGORY Error: Q3, 4, or 5 should not be populated when Q2 is No High Risk No Risk       Last PHQ 2/9 Scores:    01/11/2023    1:13 PM 06/20/2022    1:51 PM 01/12/2022    3:00 PM  Depression screen PHQ 2/9  Decreased Interest 0 0 0  Down, Depressed, Hopeless 0 0 0  PHQ - 2 Score 0 0 0  Altered sleeping 0 1 1  Tired, decreased energy 0 0 1  Change in appetite 0 1 0  Feeling bad or failure about yourself  0 0 0  Trouble concentrating 0 0 0   Moving slowly or fidgety/restless 0 0 0  Suicidal thoughts 0 0 0  PHQ-9 Score 0 2 2  Difficult doing work/chores Not difficult at all Not difficult at all Not difficult at all    Scribe for Treatment Team: Glenis Smoker, LCSW 01/30/2023 3:49 PM

## 2023-01-30 NOTE — Progress Notes (Signed)
   01/30/23 1500  Spiritual Encounters  Type of Visit Follow up  Care provided to: Patient   Chaplain saw patient in the hallway as she was about to be discharged. Patient shared with the chaplain that she is going to a shelter and that she is happy. She stated this is a new beginning for her. Chaplain offered her words of hope and encouragement.

## 2023-01-30 NOTE — Group Note (Signed)
LCSW Group Therapy Note  Group Date: 01/30/2023 Start Time: 1300 End Time: 1400   Type of Therapy and Topic:  Group Therapy: Values and Support Systems   Participation Level:  Active   Description of Group:   This group addressed what support systems look look like for patients.  Group also consisted of identifying values held by the different systems in peoples life.  Systems to include: society, friends, family, and self.  Patients went around the room and identified values they feel that each system holds.  Patients reflected on why they felt the different system valued the different values discussed.  Patients were encouraged to think daily about the values they hold and how they can implement them in their daily lives.     Therapeutic Goals: Patients will identify different values  Patients will demonstrate empathy for others by providing active listening  Patients will verbalize their feelings and thoughts on the values discussed Patients will provide support for group members for the values identified   Summary of Patient Progress:   Patient was actively engaged in the discussion and . She was able to identify values that she has personally as well as ones she feel her family, friends and society hold dear.  She was able to provide insight into why she felt these values were held by the respective groups.  Patient demonstrated fair insight into the subject matter, was respectful of peers, participated throughout the entire session.  Therapeutic Modalities:   Cognitive Behavioral Therapy Motivational Interviewing    Harden Mo, [place] 01/30/2023  2:42 PM

## 2023-01-30 NOTE — Discharge Summary (Signed)
Physician Discharge Summary Note  Patient:  Melanie Bell is an 32 y.o., female MRN:  [id number] DOB:  1990-06-03 Patient phone:  [phone] (home)  Patient address:   [address] [place] [address],  Total Time spent with patient: 45 minutes  Date of Admission:  01/24/2023 Date of Discharge: 01/30/2023  Reason for Admission: Overdose  Principal Problem: Schizoaffective disorder, depressive type [facility]) Discharge Diagnoses: Principal Problem:   Schizoaffective disorder, depressive type (HCC) Active Problems:   Suicide attempt [facility])   Past Psychiatric History: Schizoaffective DepressiveType  Past Medical History:  Past Medical History:  Diagnosis Date   Allergy    Anxiety    Arthritis    Asthma    Depression    Foreign body aspiration    GERD (gastroesophageal reflux disease)    Hallucinations 09/30/2014   Sizoaffective   Hyperlipidemia    Hypertension    Hypoglycemia    Intentional ingestion of batteries 04/21/2016    2 AAA batteries and 1 thumb tack; intent to hurt herself/notes 04/21/2016   Seizure (HCC) 01/16/2023   Self-inflicted injury 11/23/2017   Suicidal ideation 01/02/2018   Tardive dyskinesia 10/2014   recent onset   Thyroid disease     Past Surgical History:  Procedure Laterality Date   ABDOMINAL SURGERY     "years ago" to remove foreign objects   APPENDECTOMY     BREAST EXCISIONAL BIOPSY Right 09/03/2007   surgical bx removed fibroadeoma   BREAST LUMPECTOMY Right    CHOLECYSTECTOMY  02/2022   COLONOSCOPY WITH PROPOFOL N/A 09/10/2015   Procedure: COLONOSCOPY WITH PROPOFOL;  Surgeon: Christena Deem, MD;  Location: [facility] ENDOSCOPY;  Service: Endoscopy;  Laterality: N/A;   ESOPHAGOGASTRODUODENOSCOPY N/A 11/28/2014   Procedure: ESOPHAGOGASTRODUODENOSCOPY (EGD);  Surgeon: Scot Jun, MD;  Location: [facility] ENDOSCOPY;  Service: Endoscopy;  Laterality: N/A;   ESOPHAGOGASTRODUODENOSCOPY N/A 02/21/2016   Procedure:  ESOPHAGOGASTRODUODENOSCOPY (EGD);  Surgeon: Napoleon Form, MD;  Location: [facility] ENDOSCOPY;  Service: Endoscopy;  Laterality: N/A;   ESOPHAGOGASTRODUODENOSCOPY N/A 04/21/2016   Procedure: ESOPHAGOGASTRODUODENOSCOPY (EGD);  Surgeon: Iva Boop, MD;  Location: [facility] ENDOSCOPY;  Service: Endoscopy;  Laterality: N/A;   ESOPHAGOGASTRODUODENOSCOPY N/A 05/06/2016   Procedure: ESOPHAGOGASTRODUODENOSCOPY (EGD);  Surgeon: Wyline Mood, MD;  Location: [facility] ENDOSCOPY;  Service: Gastroenterology;  Laterality: N/A;   ESOPHAGOGASTRODUODENOSCOPY N/A 06/13/2016   Procedure: ESOPHAGOGASTRODUODENOSCOPY (EGD);  Surgeon: Midge Minium, MD;  Location: [facility] ENDOSCOPY;  Service: Endoscopy;  Laterality: N/A;   ESOPHAGOGASTRODUODENOSCOPY N/A 01/14/2022   Procedure: ESOPHAGOGASTRODUODENOSCOPY (EGD);  Surgeon: Toledo, Boykin Nearing, MD;  Location: [facility] ENDOSCOPY;  Service: Gastroenterology;  Laterality: N/A;   ESOPHAGOGASTRODUODENOSCOPY (EGD) WITH PROPOFOL N/A 02/29/2016   Procedure: ESOPHAGOGASTRODUODENOSCOPY (EGD) WITH PROPOFOL;  Surgeon: Midge Minium, MD;  Location: [facility] ENDOSCOPY;  Service: Endoscopy;  Laterality: N/A;   ESOPHAGOGASTRODUODENOSCOPY (EGD) WITH PROPOFOL N/A 01/15/2018   Procedure: ESOPHAGOGASTRODUODENOSCOPY (EGD) WITH PROPOFOL;  Surgeon: Corbin Ade, MD;  [facility]O [facility];  Service: Endoscopy;  Laterality: N/A;  retrieval forein body   LAPAROSCOPIC LYSIS OF ADHESIONS  02/16/2022   Procedure: LAPAROSCOPIC LYSIS OF ADHESIONS;  Surgeon: Henrene Dodge, MD;  Location: [facility] ORS;  Service: General;;   LAPAROTOMY N/A 09/12/2015   Procedure: EXPLORATORY LAPAROTOMY;  Surgeon: Lattie Haw, MD;  Location: [facility] ORS;  Service: General;  Laterality: N/A;   SIGMOIDOSCOPY N/A 09/12/2015   Procedure: Daiva Huge;  Surgeon: Lattie Haw, MD;  Location: [facility] ORS;  Service: General;  Laterality: N/A;   WISDOM TOOTH EXTRACTION  Family History:  Family History  Problem Relation Age of Onset   Depression  Mother    Hypertension Mother    Sleep apnea Mother    Asthma Mother    COPD Mother    Diabetes Mother    Anxiety disorder Mother    Arthritis Mother    Heart failure Mother    Breast cancer Maternal Aunt        20's   Stroke Maternal Grandmother    Heart attack Maternal Grandfather    Family Psychiatric  History: see above Social History:  Social History   Substance and Sexual Activity  Alcohol Use No     Social History   Substance and Sexual Activity  Drug Use No    Social History   Socioeconomic History   Marital status: Married    Spouse name: Not on file   Number of children: 0   Years of education: 12   Highest education level: Not on file  Occupational History   Occupation: unemployed  Tobacco Use   Smoking status: Former    Current packs/day: 0.00    Average packs/day: 0.5 packs/day for 3.0 years (1.5 ttl pk-yrs)    Types: Cigarettes    Start date: 08/14/2013    Quit date: 08/14/2016    Years since quitting: 6.4    Passive exposure: Past   Smokeless tobacco: Never  Vaping Use   Vaping status: Never Used  Substance and Sexual Activity   Alcohol use: No   Drug use: No   Sexual activity: Yes    Partners: Male    Birth control/protection: None  Other Topics Concern   Not on file  Social History Narrative   Right handed    One story home   Drinks occasional caffeine   Social Determinants of Health   Financial Resource Strain: Low Risk  (01/11/2023)   Overall Financial Resource Strain (CARDIA)    Difficulty of Paying Living Expenses: Not very hard  Food Insecurity: Food Insecurity Present (01/24/2023)   Hunger Vital Sign    Worried About Running Out of Food in the Last Year: Sometimes true    Ran Out of Food in the Last Year: Sometimes true  Transportation Needs: No Transportation Needs (01/24/2023)   PRAPARE - Administrator, Civil Service (Medical): No    Lack of Transportation (Non-Medical): No  Physical Activity: Sufficiently Active  (01/11/2023)   Exercise Vital Sign    Days of Exercise per Week: 5 days    Minutes of Exercise per Session: 30 min  Recent Concern: Physical Activity - Insufficiently Active (12/27/2022)   Exercise Vital Sign    Days of Exercise per Week: 7 days    Minutes of Exercise per Session: 20 min  Stress: No Stress Concern Present (01/11/2023)   [employer] of Occupational Health - Occupational Stress Questionnaire    Feeling of Stress : Not at all  Social Connections: Moderately Integrated (01/11/2023)   Social Connection and Isolation Panel [NHANES]    Frequency of Communication with Friends and Family: More than three times a week    Frequency of Social Gatherings with Friends and Family: Once a week    Attends Religious Services: More than 4 times per year    Active Member of [employer] or Organizations: No    Attends Banker Meetings: Never    Marital Status: Married    Hospital Course:  32yo female admitted for Overdose and Depression, admitted to medical floor till stabilized.  Transferred to Psychiatry unit,medication adjusted and therapy initiated.Patient stabilized denies sucidial/homicidal ideations,psychosis and substance abuse.The patient has met maximum benefit of hospitalization. Discharge instructions provided with explanation with crisis number, RX, and instructions to follow up with ACT team  Musculoskeletal: Strength & Muscle Tone: within normal limits Gait & Station: normal Patient leans: N/A  Psychiatric Specialty Exam: Physical Exam Vitals and nursing note reviewed.  Constitutional:      Appearance: Normal appearance.  HENT:     Head: Normocephalic.     Nose: Nose normal.  Pulmonary:     Effort: Pulmonary effort is normal.  Musculoskeletal:        General: Normal range of motion.     Cervical back: Normal range of motion.  Neurological:     General: No focal deficit present.     Mental Status: She is alert and oriented to person, place, and time.      Review of Systems  Constitutional: Negative.   Psychiatric/Behavioral:  The patient is nervous/anxious.   All other systems reviewed and are negative.   Blood pressure 116/68, pulse 79, temperature 98.5 F (36.9 C), temperature source Oral, resp. rate 17, height 5\' 8"  (1.727 m), weight 124.3 kg, last menstrual period 01/13/2023, SpO2 100%, unknown if currently breastfeeding.Body mass index is 41.66 kg/m.  General Appearance: Casual  Eye Contact:  Good  Speech:  Clear and Coherent  Volume:  Normal  Mood:  Anxious  Affect:  Congruent  Thought Process:  Coherent and Descriptions of Associations: Intact  Orientation:  Full (Time, Place, and Person)  Thought Content:  WDL  Suicidal Thoughts:  No  Homicidal Thoughts:  No  Memory:  Immediate;   Good Recent;   Good Remote;   Good  Judgement:  Fair  Insight:  Fair  Psychomotor Activity:  Normal  Concentration:  Concentration: Good and Attention Span: Good  Recall:  Good  Fund of Knowledge:  Good  Language:  Good  Akathisia:  No  Handed:  Right  AIMS (if indicated):     Assets:  Communication Skills Leisure Time Physical Health Resilience Social Support  ADL's:  Intact  Cognition:  WNL  Sleep:        Physical Exam: Physical Exam Vitals and nursing note reviewed.  Constitutional:      Appearance: Normal appearance.  HENT:     Head: Normocephalic.     Nose: Nose normal.  Pulmonary:     Effort: Pulmonary effort is normal.  Musculoskeletal:        General: Normal range of motion.     Cervical back: Normal range of motion.  Neurological:     General: No focal deficit present.     Mental Status: She is alert and oriented to person, place, and time.    Review of Systems  Constitutional: Negative.   Psychiatric/Behavioral:  The patient is nervous/anxious.   All other systems reviewed and are negative.  Blood pressure 116/68, pulse 79, temperature 98.5 F (36.9 C), temperature source Oral, resp. rate 17, height 5\' 8"   (1.727 m), weight 124.3 kg, last menstrual period 01/13/2023, SpO2 100%, unknown if currently breastfeeding. Body mass index is 41.66 kg/m.   Social History   Tobacco Use  Smoking Status Former   Current packs/day: 0.00   Average packs/day: 0.5 packs/day for 3.0 years (1.5 ttl pk-yrs)   Types: Cigarettes   Start date: 08/14/2013   Quit date: 08/14/2016   Years since quitting: 6.4   Passive exposure: Past  Smokeless Tobacco Never  Tobacco Cessation:  N/A, patient does not currently use tobacco products   Blood Alcohol level:  Lab Results  Component Value Date   ETH <10 01/20/2023   ETH <10 11/07/2022    Metabolic Disorder Labs:  Lab Results  Component Value Date   HGBA1C 5.4 01/26/2023   MPG 108 01/26/2023   MPG 99.67 11/23/2017   Lab Results  Component Value Date   PROLACTIN 3.1 05/02/2022   PROLACTIN 17.2 11/01/2021   Lab Results  Component Value Date   CHOL 153 01/26/2023   TRIG 48 01/26/2023   HDL 40 (L) 01/26/2023   CHOLHDL 3.8 01/26/2023   VLDL 10 01/26/2023   LDLCALC 103 (H) 01/26/2023   LDLCALC 120 (H) 11/11/2022    See Psychiatric Specialty Exam and Suicide Risk Assessment completed by Attending Physician prior to discharge.  Discharge destination:  Home  Is patient on multiple antipsychotic therapies at discharge:  No   Has Patient had three or more failed trials of antipsychotic monotherapy by history:  No  Recommended Plan for Multiple Antipsychotic Therapies: NA  Discharge Instructions     Diet - low sodium heart healthy   Complete by: As directed    Discharge instructions   Complete by: As directed    Follow up with [facility] ACT team   Increase activity slowly   Complete by: As directed         Follow-up Information     [facility] Schedule an appointment as soon as possible for a visit.   Why: ACTT services will be meeting you at the house. Be sure to ask them about follow up appointments.  Thanks! Contact information: [address] [place] [address] [phone]                 Follow-up recommendations:    Activity:  as tolerated Diet:  heart healthy   Schizoaffective disorder, bipolar type Abilify 7.5mg   by mouth daily  Comments:  follow up with Outpatient Provider  Signed: [name], NP 01/30/2023, 1:25 PM

## 2023-01-30 NOTE — Progress Notes (Signed)
D- Patient alert and oriented. Patient presents in an anxious, but pleasant mood on assessment reporting that she slept "good" last night and had no complaints to voice to this Clinical research associate. Patient denies depression, but endorses anxiety about leaving, "the unknown and going to a new environment" has her feeling this way. Patient also denies SI, HI, AVH, and pain at this time. Patient's goal for today, per her self-inventory, is to "have a peaceful transition after discharge", in which she will "follow safety plan, get to shelter", in order to achieve her goal.  A- Scheduled medications administered to patient, per MD orders. Support and encouragement provided. Routine safety checks conducted every 15 minutes. Patient informed to notify staff with problems or concerns.  R- No adverse drug reactions noted. Patient contracts for safety at this time. Patient compliant with medications and treatment plan. Patient receptive, calm, and cooperative. Patient interacts well with others on the unit. Patient remains safe at this time.

## 2023-01-30 NOTE — Progress Notes (Signed)
Patient ID: Melanie Bell, female   DOB: 1990-08-03, 32 y.o.   MRN: [id number]  Discharge Note:  Patient denies SI/HI/AVH at this time. Discharge instructions, AVS, prescriptions, and transition record gone over with patient. Patient given a copy of her Suicide Safety Plan. Patient agrees to comply with medication management, follow-up visit, and outpatient therapy. Patient belongings returned to patient. Patient questions and concerns addressed and answered. Patient ambulatory off unit. Patient discharged to home via [employer] services.

## 2023-01-30 NOTE — Plan of Care (Signed)
  Problem: Coping: Goal: Ability to verbalize frustrations and anger appropriately will improve Outcome: Progressing  Patient appears less anxious able to verbalize her concerns.

## 2023-01-30 NOTE — Group Note (Signed)
Date:  01/30/2023 Time:  10:06 AM  Group Topic/Focus:  Goals Group:   The focus of this group is to help patients establish daily goals to achieve during treatment and discuss how the patient can incorporate goal setting into their daily lives to aide in recovery.    Participation Level:  Active  Participation Quality:  Appropriate  Affect:  Appropriate  Cognitive:  Appropriate  Insight: Appropriate  Engagement in Group:  Engaged  Modes of Intervention:  Discussion, Education, and Support  Additional Comments:    Wilford Corner 01/30/2023, 10:06 AM

## 2023-01-31 ENCOUNTER — Ambulatory Visit: Payer: Medicaid Other

## 2023-02-02 ENCOUNTER — Ambulatory Visit: Payer: Medicaid Other

## 2023-02-07 ENCOUNTER — Ambulatory Visit: Payer: Medicaid Other

## 2023-02-13 ENCOUNTER — Other Ambulatory Visit: Payer: Medicaid Other | Admitting: Obstetrics and Gynecology

## 2023-02-13 NOTE — Patient Instructions (Signed)
Visit Information  Ms. Fairhurst was given information about Medicaid Managed Care team care coordination services and verbally consented to engagement with the [facility] Managed Care team.   Ms. Nodal - following are the goals we discussed in your visit today:   Goals Addressed    Timeframe:  Long-Range Goal Priority:  High Start Date:  08/22/22                           Expected End Date: ongoing                      Follow Up Date 03/15/23   - practice safe sex - schedule appointment for vaccines needed due to my age or health - schedule recommended health tests (blood work, mammogram, colonoscopy, pap test) - schedule and keep appointment for annual check-up    Why is this important?   Screening tests can find diseases early when they are easier to treat.  Your doctor or nurse will talk with you about which tests are important for you.  Getting shots for common diseases like the flu and shingles will help prevent them.  02/13/23:  Upcoming RHEUM and PCP appts, ACT team 1-2 times a week  Patient verbalizes understanding of instructions and care plan provided today and agrees to view in MyChart. Active MyChart status and patient understanding of how to access instructions and care plan via MyChart confirmed with patient.     The Managed Medicaid care management team will reach out to the patient again over the next 30 business  days.  The  Patient  has been provided with contact information for the Managed Medicaid care management team and has been advised to call with any health related questions or concerns.   Following is a copy of your plan of care:  Care Plan : RN Care Manager Plan of Care  Updates made by Danie Chandler, RN since 02/13/2023 12:00 AM     Problem: Health Promotion or Disease Self-Management (General Plan of Care)      Long-Range Goal: Chronic Disease Management   Start Date: 08/22/2022  Expected End Date: 03/29/2023  Priority: High  Note:   Current Barriers:   Knowledge Deficits related to plan of care for management of migraines, asthma, GERD, PTSD, borderline personality disorder, schizoaffective disorder, chronic pain, hypothyroid, osteoarthritis  Chronic Disease Management support and education needs related to  Knowledge Deficits related to plan of care for management of migraines, asthma, GERD, PTSD, borderline personality disorder, schizoaffective disorder, chronic pain, hypothyroid, osteoarthritis 02/13/23:  Hospitalized 9-6 to 9-10 for suicide attempt, ACT team 1-2 times a week.  BP stable.  EEG revealed pseudoseizures.     RNCM Clinical Goal(s):  Patient will verbalize understanding of plan for management of migraines, asthma, GERD, PTSD, borderline personality disorder, schizoaffective disorder, chronic pain, hypothyroid, osteoarthritis  verbalize basic understanding of  migraines, asthma, GERD, PTSD, borderline personality disorder, schizoaffective disorder, chronic pain, hypothyroid, osteoarthritis  take all medications exactly as prescribed and will call provider for medication related questions as evidenced by patient report demonstrate understanding of rationale for each prescribed medication as evidenced by patient report attend all scheduled medical appointments as evidenced by patient report and EMR review demonstrate ongoing  adherence to prescribed treatment plan for migraines, asthma, GERD, PTSD, borderline personality disorder, schizoaffective disorder, chronic pain, hypothyroid, osteoarthritis  continue to work with RN Care Manager to address care management and care coordination needs related  to migraines, asthma, GERD, PTSD, borderline personality disorder, schizoaffective disorder, chronic pain, hypothyroid, osteoarthritis  experience decrease in ED visits as evidenced by EMR review.  ED visits in in last 6 months = 16 through collaboration with RN Care manager, provider, and care team.   Interventions: Inter-disciplinary care  team collaboration (see longitudinal plan of care) Evaluation of current treatment plan related to  self management and patient's adherence to plan as established by provider  Asthma: (Status:New goal.) Long Term Goal Advised patient to track and manage Asthma triggers Advised patient to self assesses Asthma action plan zone and make appointment with provider if in the yellow zone for 48 hours without improvement Provided education about and advised patient to utilize infection prevention strategies to reduce risk of respiratory infection Discussed the importance of adequate rest and management of fatigue with Asthma Assessed social determinant of health barriers   Pain Interventions:  (Status:  New goal.) Long Term Goal Pain assessment performed Medications reviewed Reviewed provider established plan for pain management Discussed importance of adherence to all scheduled medical appointments Counseled on the importance of reporting any/all new or changed pain symptoms or management strategies to pain management provider Advised patient to report to care team affect of pain on daily activities Discussed use of relaxation techniques and/or diversional activities to assist with pain reduction (distraction, imagery, relaxation, massage, acupressure, TENS, heat, and cold application Reviewed with patient prescribed pharmacological and nonpharmacological pain relief strategies Assessed social determinant of health barriers  Patient Goals/Self-Care Activities: Take all medications as prescribed Attend all scheduled provider appointments Call pharmacy for medication refills 3-7 days in advance of running out of medications Perform all self care activities independently  Perform IADL's (shopping, preparing meals, housekeeping, managing finances) independently Call provider office for new concerns or questions   Follow Up Plan:  The patient has been provided with contact information for the care  management team and has been advised to call with any health related questions or concerns.  The care management team will reach out to the patient again over the next 60 business  days.    Kathi Der RN, BSN [place]  [employer] - Managed Medicaid High Risk [phone]

## 2023-02-13 NOTE — Patient Outreach (Signed)
Medicaid Managed Care   Nurse Care Manager Note  02/13/2023 Name:  Melanie Bell MRN:  [id number] DOB:  [birth year]  Melanie Bell is an 32 y.o. year old female who is a primary patient of [name], [name], MD.  The [facility] Managed Care Coordination team was consulted for assistance with:    Chronic healthcare management needs, asthma, ostoarthritis, migraines, seizures, GERD, PTSD, depression, hypothyroidism, HLD, BP, personality disorder, pseudoseizures, schizoaffective disorder  [name] was given information about Medicaid Managed Care Coordination team services today. [name] Patient agreed to services and verbal consent obtained.  Engaged with patient by telephone for follow up visit in response to provider referral for case management and/or care coordination services.   Assessments/Interventions:  Review of past medical history, allergies, medications, health status, including review of consultants reports, laboratory and other test data, was performed as part of comprehensive evaluation and provision of chronic care management services.  SDOH (Social Determinants of Health) assessments and interventions performed: SDOH Interventions    Flowsheet Row Patient Outreach Telephone from 02/13/2023 in [place] POPULATION HEALTH DEPARTMENT Clinical Support from 01/11/2023 in [facility] Primary Care & Sports Medicine at [facility] Patient Outreach Telephone from 12/27/2022 in [place] POPULATION HEALTH DEPARTMENT Patient Outreach Telephone from 10/27/2022 in [place] POPULATION HEALTH DEPARTMENT Patient Outreach Telephone from 09/21/2022 in [place] POPULATION HEALTH DEPARTMENT Patient Outreach Telephone from 08/22/2022 in [place] POPULATION HEALTH DEPARTMENT  SDOH Interventions        Food Insecurity Interventions -- Intervention Not Indicated -- Intervention Not Indicated -- --  Housing Interventions -- Intervention Not Indicated -- Intervention Not  Indicated -- --  Transportation Interventions -- Intervention Not Indicated, Patient Resources Dietitian) -- -- Intervention Not Indicated  [[name]] --  Utilities Interventions -- Intervention Not Indicated -- -- -- --  Alcohol Usage Interventions -- Intervention Not Indicated (Score <7) -- -- -- Intervention Not Indicated (Score <7)  Depression Interventions/Treatment  -- PHQ2-9 Score <4 Follow-up Not Indicated -- -- -- --  Financial Strain Interventions -- Intervention Not Indicated -- -- -- --  Physical Activity Interventions -- Intervention Not Indicated Intervention Not Indicated -- -- --  Stress Interventions -- Intervention Not Indicated -- -- -- Intervention Not Indicated  Social Connections Interventions Intervention Not Indicated Intervention Not Indicated -- -- -- --  Health Literacy Interventions Intervention Not Indicated Intervention Not Indicated Intervention Not Indicated -- -- --     Care Plan Allergies  Allergen Reactions   Betadine [Povidone Iodine] Hives   Fish Allergy Hives   Shellfish-Derived Products Hives    Medications Reviewed Today     Reviewed by Danie Chandler, RN (Registered Nurse) on 02/13/23 at 1131  Med List Status: <None>   Medication Order Taking? Sig Documenting Provider Last Dose Status Informant  albuterol (VENTOLIN HFA) 108 (90 Base) MCG/ACT inhaler [id number]  2 puffs four times a day as needed  Patient taking differently: Inhale 2 puffs into the lungs every 8 (eight) hours as needed for wheezing or shortness of breath.   [name], DO  Active Self, Pharmacy Records  ARIPiprazole (ABILIFY) 15 MG tablet [id number]  Take 0.5 tablets (7.5 mg total) by mouth daily for 2 days. Charm Rings, NP  Expired 01/31/23 2359   cabergoline (DOSTINEX) 0.5 MG tablet [id number]  Take 1 tablet (0.5 mg total) by mouth once a week. [name], DO  Active Self, Pharmacy Records  Cyanocobalamin (VITAMIN B-12 PO) [id number]  Take 1 capsule  by  mouth daily. [provider]  Active Self, Pharmacy Records  furosemide (LASIX) 20 MG tablet [id number]  Take 1 tablet (20 mg total) by mouth daily. [name], NP  Active   levothyroxine (SYNTHROID) 75 MCG tablet [id number]  Take 1 tablet (75 mcg total) by mouth daily before breakfast. [name], DO  Active Self, Pharmacy Records  Magnesium 500 MG CAPS [id number]  Take 500 mg by mouth at bedtime. [provider]  Active Self, Pharmacy Records  omeprazole (PRILOSEC) 20 MG capsule [id number]  TAKE 1 CAPSULE (20 MG TOTAL) BY MOUTH DAILY. [name], MD  Active Pharmacy Records, Self  ondansetron (ZOFRAN-ODT) 4 MG disintegrating tablet [id number]  Take 1 tablet (4 mg total) by mouth every 8 (eight) hours as needed.  Patient taking differently: Take 4 mg by mouth as needed for nausea or vomiting.   [name], DO  Active Self, Pharmacy Records  sertraline (ZOLOFT) 50 MG tablet [id number] Yes Take 50 mg by mouth daily. [provider]  Active Self           Patient Active Problem List   Diagnosis Date Noted   Drug overdose, intentional self-harm, initial encounter (HCC) 01/24/2023   Insomnia 01/22/2023   Drug overdose, multiple drugs, intentional self-harm, initial encounter (HCC) 01/21/2023   Hypothyroidism 01/21/2023   Mild intermittent asthma 01/21/2023   Seizure (HCC) 01/16/2023   Major depressive disorder, recurrent episode, severe, without psychosis (HCC) 11/11/2022   Intentional overdose (HCC) 11/08/2022   Amenorrhea 09/05/2022   Possible pregnancy 09/05/2022   Symptomatic cholelithiasis 02/16/2022   Peritoneal adhesions without obstruction 02/16/2022   Hematemesis 01/14/2022   Encounter for supervision of other normal pregnancy, first trimester 11/12/2021   Morbid obesity (HCC) 09/30/2021   Acquired hypothyroidism 06/25/2021   Migraine without aura and without status migrainosus, not intractable 03/05/2021   Mild intermittent  asthma without complication 12/01/2020   Gastroesophageal reflux disease 12/01/2020   Hypoglycemia 12/01/2020   Rheumatoid factor positive 12/09/2019   SS-A antibody positive 10/28/2019   Chronic pain of both knees 10/28/2019   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   Schizoaffective disorder, depressive type (HCC) 02/02/2018   Suicide attempt (HCC) 01/16/2018   Dysfunctional uterine bleeding 01/06/2018   Borderline personality disorder (HCC) 01/04/2018   PTSD (post-traumatic stress disorder) 01/03/2018   ASCUS of cervix with negative high risk HPV 08/28/2017   Malingering 05/06/2016   Conditions to be addressed/monitored per PCP order:  Chronic healthcare management needs, asthma, ostoarthritis, migraines, seizures, GERD, PTSD, depression, hypothyroidism, HLD, BP, personality disorder, pseudoseizures, schizoaffective disorder  Care Plan : RN Care Manager Plan of Care  Updates made by Danie Chandler, RN since 02/13/2023 12:00 AM     Problem: Health Promotion or Disease Self-Management (General Plan of Care)      Long-Range Goal: Chronic Disease Management   Start Date: 08/22/2022  Expected End Date: 03/29/2023  Priority: High  Note:   Current Barriers:  Knowledge Deficits related to plan of care for management of migraines, asthma, GERD, PTSD, borderline personality disorder, schizoaffective disorder, chronic pain, hypothyroid, osteoarthritis  Chronic Disease Management support and education needs related to  Knowledge Deficits related to plan of care for management of migraines, asthma, GERD, PTSD, borderline personality disorder, schizoaffective disorder, chronic pain, hypothyroid, osteoarthritis 02/13/23:  Hospitalized 9-6 to 9-10 for suicide attempt, ACT team 1-2 times a week.  BP stable.  EEG revealed pseudoseizures.     RNCM Clinical Goal(s):  Patient will verbalize understanding of plan for  management of migraines, asthma, GERD, PTSD, borderline personality disorder,  schizoaffective disorder, chronic pain, hypothyroid, osteoarthritis  verbalize basic understanding of  migraines, asthma, GERD, PTSD, borderline personality disorder, schizoaffective disorder, chronic pain, hypothyroid, osteoarthritis  take all medications exactly as prescribed and will call provider for medication related questions as evidenced by patient report demonstrate understanding of rationale for each prescribed medication as evidenced by patient report attend all scheduled medical appointments as evidenced by patient report and EMR review demonstrate ongoing  adherence to prescribed treatment plan for migraines, asthma, GERD, PTSD, borderline personality disorder, schizoaffective disorder, chronic pain, hypothyroid, osteoarthritis  continue to work with RN Care Manager to address care management and care coordination needs related to migraines, asthma, GERD, PTSD, borderline personality disorder, schizoaffective disorder, chronic pain, hypothyroid, osteoarthritis  experience decrease in ED visits as evidenced by EMR review.  ED visits in in last 6 months = 16 through collaboration with RN Care manager, provider, and care team.   Interventions: Inter-disciplinary care team collaboration (see longitudinal plan of care) Evaluation of current treatment plan related to  self management and patient's adherence to plan as established by provider  Asthma: (Status:New goal.) Long Term Goal Advised patient to track and manage Asthma triggers Advised patient to self assesses Asthma action plan zone and make appointment with provider if in the yellow zone for 48 hours without improvement Provided education about and advised patient to utilize infection prevention strategies to reduce risk of respiratory infection Discussed the importance of adequate rest and management of fatigue with Asthma Assessed social determinant of health barriers   Pain Interventions:  (Status:  New goal.) Long Term  Goal Pain assessment performed Medications reviewed Reviewed provider established plan for pain management Discussed importance of adherence to all scheduled medical appointments Counseled on the importance of reporting any/all new or changed pain symptoms or management strategies to pain management provider Advised patient to report to care team affect of pain on daily activities Discussed use of relaxation techniques and/or diversional activities to assist with pain reduction (distraction, imagery, relaxation, massage, acupressure, TENS, heat, and cold application Reviewed with patient prescribed pharmacological and nonpharmacological pain relief strategies Assessed social determinant of health barriers  Patient Goals/Self-Care Activities: Take all medications as prescribed Attend all scheduled provider appointments Call pharmacy for medication refills 3-7 days in advance of running out of medications Perform all self care activities independently  Perform IADL's (shopping, preparing meals, housekeeping, managing finances) independently Call provider office for new concerns or questions   Follow Up Plan:  The patient has been provided with contact information for the care management team and has been advised to call with any health related questions or concerns.  The care management team will reach out to the patient again over the next 60 business  days.    Long-Range Goal: Establish Plan of Care for Chronic Disease Management Needs   Priority: High  Note:   Timeframe:  Long-Range Goal Priority:  High Start Date:  08/22/22                           Expected End Date: ongoing                      Follow Up Date 03/15/23   - practice safe sex - schedule appointment for vaccines needed due to my age or health - schedule recommended health tests (blood work, mammogram, colonoscopy, pap test) - schedule and keep appointment  for annual check-up    Why is this important?   Screening  tests can find diseases early when they are easier to treat.  Your doctor or nurse will talk with you about which tests are important for you.  Getting shots for common diseases like the flu and shingles will help prevent them.  02/13/23:  Upcoming RHEUM and PCP appts, ACT team 1-2 times a week   Follow Up:  Patient agrees to Care Plan and Follow-up.  Plan: The Managed Medicaid care management team will reach out to the patient again over the next 30 business  days. and The  Patient has been provided with contact information for the Managed Medicaid care management team and has been advised to call with any health related questions or concerns.  Date/time of next scheduled RN care management/care coordination outreach: 03/15/23

## 2023-02-21 ENCOUNTER — Ambulatory Visit: Payer: Medicaid Other | Admitting: Internal Medicine

## 2023-02-23 ENCOUNTER — Emergency Department
Admission: EM | Admit: 2023-02-23 | Discharge: 2023-02-23 | Disposition: A | Discharge status: Home / Self Care | Payer: Medicaid Other | Attending: Emergency Medicine | Admitting: Emergency Medicine

## 2023-02-23 ENCOUNTER — Other Ambulatory Visit: Payer: Self-pay

## 2023-02-23 DIAGNOSIS — R110 Nausea: Secondary | ICD-10-CM | POA: Diagnosis present

## 2023-02-23 DIAGNOSIS — I10 Essential (primary) hypertension: Secondary | ICD-10-CM | POA: Diagnosis not present

## 2023-02-23 DIAGNOSIS — B349 Viral infection, unspecified: Secondary | ICD-10-CM | POA: Diagnosis not present

## 2023-02-23 DIAGNOSIS — Z1152 Encounter for screening for COVID-19: Secondary | ICD-10-CM | POA: Diagnosis not present

## 2023-02-23 LAB — RESP PANEL BY RT-PCR (RSV, FLU A&B, COVID)  RVPGX2
Influenza A by PCR: NEGATIVE
Influenza B by PCR: NEGATIVE
Resp Syncytial Virus by PCR: NEGATIVE
SARS Coronavirus 2 by RT PCR: NEGATIVE

## 2023-02-23 LAB — URINALYSIS, ROUTINE W REFLEX MICROSCOPIC
Bilirubin Urine: NEGATIVE
Glucose, UA: NEGATIVE mg/dL
Hgb urine dipstick: NEGATIVE
Ketones, ur: NEGATIVE mg/dL
Leukocytes,Ua: NEGATIVE
Nitrite: NEGATIVE
Protein, ur: NEGATIVE mg/dL
Specific Gravity, Urine: 1.019 (ref 1.005–1.030)
pH: 6 (ref 5.0–8.0)

## 2023-02-23 LAB — POC URINE PREG, ED: Preg Test, Ur: NEGATIVE

## 2023-02-23 MED ORDER — ONDANSETRON 4 MG PO TBDP
4.0000 mg | ORAL_TABLET | Freq: Once | ORAL | Status: AC
  Administered 2023-02-23: 4 mg via ORAL
  Filled 2023-02-23: qty 1

## 2023-02-23 MED ORDER — ONDANSETRON 4 MG PO TBDP: 4.0000 mg | ORAL_TABLET | Freq: Three times a day (TID) | ORAL | 0 refills | Status: DC | PRN

## 2023-02-23 NOTE — ED Triage Notes (Signed)
First nurse note: pt to ED [facility] from home for possible bug bite to right ankle a week ago. Reports drainage. Ambulatory.

## 2023-02-23 NOTE — ED Provider Notes (Signed)
[facility] Provider Note    Event Date/Time   First MD Initiated Contact with Patient 02/23/23 1416     (approximate)   History   No chief complaint on file.   HPI  Melanie Bell is a 32 y.o. female with history of hypertension, hyperlipidemia, seizure, psychiatric problems presents emergency department with bodyaches, nausea, some loose stools.  Has a small bug bite she thinks it may have come from this.  Area around the bug bite she states had some drainage to it the other day.  Not draining now.  No fever or chills.  No chest pain or shortness of breath      Physical Exam   Triage Vital Signs: ED Triage Vitals  Encounter Vitals Group     BP 02/23/23 1312 138/65     Systolic BP Percentile --      Diastolic BP Percentile --      Pulse Rate 02/23/23 1312 87     Resp 02/23/23 1312 18     Temp 02/23/23 1312 98 F (36.7 C)     Temp src --      SpO2 02/23/23 1312 97 %     Weight 02/23/23 1311 274 lb (124.3 kg)     Height 02/23/23 1311 5\' 7"  (1.702 m)     Head Circumference --      Peak Flow --      Pain Score 02/23/23 1311 4     Pain Loc --      Pain Education --      Exclude from Growth Chart --     Most recent vital signs: Vitals:   02/23/23 1312  BP: 138/65  Pulse: 87  Resp: 18  Temp: 98 F (36.7 C)  SpO2: 97%     General: Awake, no distress.   CV:  Good peripheral perfusion. regular rate and  rhythm Resp:  Normal effort. Lungs CTA Abd:  No distention.  Nontender, bowel sounds present all 4 quads Other:      ED Results / Procedures / Treatments   Labs (all labs ordered are listed, but only abnormal results are displayed) Labs Reviewed  URINALYSIS, ROUTINE W REFLEX MICROSCOPIC - Abnormal; Notable for the following components:      Result Value   Color, Urine YELLOW (*)    APPearance CLEAR (*)    All other components within normal limits  RESP PANEL BY RT-PCR (RSV, FLU A&B, COVID)  [id number]  POC URINE PREG, ED      EKG     RADIOLOGY     PROCEDURES:   Procedures   MEDICATIONS ORDERED IN ED: Medications  ondansetron (ZOFRAN-ODT) disintegrating tablet 4 mg (4 mg Oral Given 02/23/23 1522)     IMPRESSION / MDM / ASSESSMENT AND PLAN / ED COURSE  I reviewed the triage vital signs and the nursing notes.                              Differential diagnosis includes, but is not limited to, UTI, pyelonephritis, pregnancy, COVID, influenza  Patient's presentation is most consistent with acute illness / injury with system symptoms.   Patient's labs are reassuring  Do not feel the patient needs any further workup at this time.  She seems to be feeling better after the Zofran ODT.  Will go ahead and give her prescription for Zofran.  She is to follow-up with her regular doctor if not  improving in 2 to 3 days.  Return emergency department if worsening.  I did give her reassurance that this is not coming from her bug bite.  Discharged in stable condition.      FINAL CLINICAL IMPRESSION(S) / ED DIAGNOSES   Final diagnoses:  Viral illness  Nausea     Rx / DC Orders   ED Discharge Orders          Ordered    ondansetron (ZOFRAN-ODT) 4 MG disintegrating tablet  Every 8 hours PRN        02/23/23 1636             Note:  This document was prepared using Dragon voice recognition software and may include unintentional dictation errors.    Faythe Ghee, PA-C 02/23/23 1638    Dionne Bucy, MD 02/23/23 [phone]

## 2023-02-24 ENCOUNTER — Telehealth: Payer: Self-pay

## 2023-02-24 NOTE — Transitions of Care (Post Inpatient/ED Visit) (Signed)
02/24/2023  [name] MRN: [id number] DOB: [birth year]  Today's TOC FU Call Status: Today's TOC FU Call Status:: Successful TOC FU Call Completed TOC FU Call Complete Date: 02/24/23 Patient's Name and Date of Birth confirmed.  Transition Care Management Follow-up Telephone Call Date of Discharge: 02/23/23 Discharge Facility: [facility]) Type of Discharge: Emergency Department Reason for ED Visit: Other: (Viral Infection) How have you been since you were released from the hospital?: Same Any questions or concerns?: No  Items Reviewed: Did you receive and understand the discharge instructions provided?: No Medications obtained,verified, and reconciled?: Yes (Medications Reviewed) Any new allergies since your discharge?: No Dietary orders reviewed?: No Do you have support at home?: No  Medications Reviewed Today: Medications Reviewed Today     Reviewed by Mariel Sleet, CMA (Certified Medical Assistant) on 02/24/23 at 1128  Med List Status: <None>   Medication Order Taking? Sig Documenting Provider Last Dose Status Informant  albuterol (VENTOLIN HFA) 108 (90 Base) MCG/ACT inhaler [id number]  2 puffs four times a day as needed  Patient taking differently: Inhale 2 puffs into the lungs every 8 (eight) hours as needed for wheezing or shortness of breath.   Melanie Cabal, DO  Active Self, Pharmacy Records  ARIPiprazole (ABILIFY) 15 MG tablet [id number]  Take 0.5 tablets (7.5 mg total) by mouth daily for 2 days. Melanie Rings, NP  Expired 01/31/23 2359   cabergoline (DOSTINEX) 0.5 MG tablet [id number]  Take 1 tablet (0.5 mg total) by mouth once a week. Melanie Ill, DO  Active Self, Pharmacy Records  Cyanocobalamin (VITAMIN B-12 PO) [id number]  Take 1 capsule by mouth daily. Melanie Bell  Active Self, Pharmacy Records  furosemide (LASIX) 20 MG tablet [id number]  Take 1 tablet (20 mg total) by mouth daily. Melanie Forehand, NP  Active   levothyroxine (SYNTHROID) 75 MCG tablet [id number]  Take 1 tablet (75 mcg total) by mouth daily before breakfast. Melanie Ill, DO  Active Self, Pharmacy Records  Magnesium 500 MG CAPS [id number]  Take 500 mg by mouth at bedtime. Melanie Bell  Active Self, Pharmacy Records  omeprazole (PRILOSEC) 20 MG capsule [id number]  TAKE 1 CAPSULE (20 MG TOTAL) BY MOUTH DAILY. Melanie Milan, Bell  Active Pharmacy Records, Self  ondansetron (ZOFRAN-ODT) 4 MG disintegrating tablet [id number]  Take 1 tablet (4 mg total) by mouth every 8 (eight) hours as needed. Melanie Ghee, PA-C  Active   sertraline (ZOLOFT) 50 MG tablet [id number]  Take 50 mg by mouth daily. Melanie Bell  Active Self            Home Care and Equipment/Supplies: Were Home Health Services Ordered?: No Any new equipment or medical supplies ordered?: No  Functional Questionnaire: Do you need assistance with bathing/showering or dressing?: No Do you need assistance with meal preparation?: No Do you need assistance with eating?: No Do you have difficulty maintaining continence: No Do you need assistance with getting out of bed/getting out of a chair/moving?: No Do you have difficulty managing or taking your medications?: No  Follow up appointments reviewed: PCP Follow-up appointment confirmed?: No Bell Provider Line Number:[phone] Given: Yes Specialist Hospital Follow-up appointment confirmed?: No Do you need transportation to your follow-up appointment?: No Do you understand care options if your condition(s) worsen?: Yes-patient verbalized understanding    [facility]  Primary Care & Sports Medicine at [facility] CMA, AAMA [address]  [place] [address] Office [phone]  Fax: [phone]

## 2023-03-14 ENCOUNTER — Ambulatory Visit: Payer: Medicaid Other | Admitting: Internal Medicine

## 2023-03-15 ENCOUNTER — Other Ambulatory Visit: Payer: Self-pay | Admitting: Obstetrics and Gynecology

## 2023-03-15 NOTE — Patient Instructions (Signed)
Hi Ms. Minette, thank you for updating me this morning, I hope you have a good day!  Ms. Harkless was given information about Medicaid Managed Care team care coordination services and verbally consented to engagement with the [facility] Managed Care team.   Ms. Goodnow - following are the goals we discussed in your visit today:   Goals Addressed    Timeframe:  Long-Range Goal Priority:  High Start Date:  08/22/22                           Expected End Date: ongoing                      Follow Up Date 05/15/23   - practice safe sex - schedule appointment for vaccines needed due to my age or health - schedule recommended health tests (blood work, mammogram, colonoscopy, pap test) - schedule and keep appointment for annual check-up    Why is this important?   Screening tests can find diseases early when they are easier to treat.  Your doctor or nurse will talk with you about which tests are important for you.  Getting shots for common diseases like the flu and shingles will help prevent them.  03/15/23:  ORTHO appt tomorrow, ACT 1-2 times a week  Patient verbalizes understanding of instructions and care plan provided today and agrees to view in MyChart. Active MyChart status and patient understanding of how to access instructions and care plan via MyChart confirmed with patient.     The Managed Medicaid care management team will reach out to the patient again over the next 60 business  days.  The  Patient has been provided with contact information for the Managed Medicaid care management team and has been advised to call with any health related questions or concerns.   Following is a copy of your plan of care:  Care Plan : RN Care Manager Plan of Care  Updates made by Danie Chandler, RN since 03/15/2023 12:00 AM     Problem: Health Promotion or Disease Self-Management (General Plan of Care)      Long-Range Goal: Chronic Disease Management   Start Date: 08/22/2022  Expected End Date:  06/15/2023  Priority: High  Note:   Current Barriers:  Knowledge Deficits related to plan of care for management of migraines, asthma, GERD, PTSD, borderline personality disorder, schizoaffective disorder, chronic pain, hypothyroid, osteoarthritis  Chronic Disease Management support and education needs related to  Knowledge Deficits related to plan of care for management of migraines, asthma, GERD, PTSD, borderline personality disorder, schizoaffective disorder, chronic pain, hypothyroid, osteoarthritis 03/15/23:  ORTHO appt tomorrow for right knee-using cane prn.  Started on Mobic by Rheumatology.  Seen in ED 10/10 for viral illness.  BP and breathing stable.  RNCM Clinical Goal(s):  Patient will verbalize understanding of plan for management of migraines, asthma, GERD, PTSD, borderline personality disorder, schizoaffective disorder, chronic pain, hypothyroid, osteoarthritis  verbalize basic understanding of  migraines, asthma, GERD, PTSD, borderline personality disorder, schizoaffective disorder, chronic pain, hypothyroid, osteoarthritis  take all medications exactly as prescribed and will call provider for medication related questions as evidenced by patient report demonstrate understanding of rationale for each prescribed medication as evidenced by patient report attend all scheduled medical appointments as evidenced by patient report and EMR review demonstrate ongoing  adherence to prescribed treatment plan for migraines, asthma, GERD, PTSD, borderline personality disorder, schizoaffective disorder, chronic pain, hypothyroid, osteoarthritis  continue  to work with RN Care Manager to address care management and care coordination needs related to migraines, asthma, GERD, PTSD, borderline personality disorder, schizoaffective disorder, chronic pain, hypothyroid, osteoarthritis  experience decrease in ED visits as evidenced by EMR review.  ED visits in in last 6 months = 16 through collaboration with  RN Care manager, provider, and care team.   Interventions: Inter-disciplinary care team collaboration (see longitudinal plan of care) Evaluation of current treatment plan related to  self management and patient's adherence to plan as established by provider  Asthma: (Status:New goal.) Long Term Goal Advised patient to track and manage Asthma triggers Advised patient to self assesses Asthma action plan zone and make appointment with provider if in the yellow zone for 48 hours without improvement Provided education about and advised patient to utilize infection prevention strategies to reduce risk of respiratory infection Discussed the importance of adequate rest and management of fatigue with Asthma Assessed social determinant of health barriers   Pain Interventions:  (Status:  New goal.) Long Term Goal Pain assessment performed Medications reviewed Reviewed provider established plan for pain management Discussed importance of adherence to all scheduled medical appointments Counseled on the importance of reporting any/all new or changed pain symptoms or management strategies to pain management provider Advised patient to report to care team affect of pain on daily activities Discussed use of relaxation techniques and/or diversional activities to assist with pain reduction (distraction, imagery, relaxation, massage, acupressure, TENS, heat, and cold application Reviewed with patient prescribed pharmacological and nonpharmacological pain relief strategies Assessed social determinant of health barriers  Patient Goals/Self-Care Activities: Take all medications as prescribed Attend all scheduled provider appointments Call pharmacy for medication refills 3-7 days in advance of running out of medications Perform all self care activities independently  Perform IADL's (shopping, preparing meals, housekeeping, managing finances) independently Call provider office for new concerns or questions    Follow Up Plan:  The patient has been provided with contact information for the care management team and has been advised to call with any health related questions or concerns.  The care management team will reach out to the patient again over the next 60 business  days.    Long-Range Goal: Establish Plan of Care for Chronic Disease Management Needs   Priority: High  Note:   Timeframe:  Long-Range Goal Priority:  High Start Date:  08/22/22                           Expected End Date: ongoing                      Follow Up Date 05/15/23   - practice safe sex - schedule appointment for vaccines needed due to my age or health - schedule recommended health tests (blood work, mammogram, colonoscopy, pap test) - schedule and keep appointment for annual check-up    Why is this important?   Screening tests can find diseases early when they are easier to treat.  Your doctor or nurse will talk with you about which tests are important for you.  Getting shots for common diseases like the flu and shingles will help prevent them.  03/15/23:  ORTHO appt tomorrow, ACT 1-2 times a week   Kathi Der RN, BSN [place]  [employer] - Managed [place] High Risk [phone]

## 2023-03-15 NOTE — Patient Outreach (Signed)
Medicaid Managed Care   Nurse Care Manager Note  03/15/2023 Name:  Melanie Bell MRN:  [id number] DOB:  1990/11/02  Melanie Bell is an 32 y.o. year old female who is a primary patient of Melanie Bell, Melanie Cowden, MD.  The [facility] Managed Care Coordination team was consulted for assistance with:    Chronic healthcare management needs, asthma, ostoarthritis, migraines, seizures, GERD, PTSD/depression/hypothyroidism, HLD, BP, personality disorder, pseudoseizures, schizoaffective disorder  [name] was given information about Medicaid Managed Care Coordination team services today. Sylvie Farrier Higuchi Patient agreed to services and verbal consent obtained.  Engaged with patient by telephone for follow up visit in response to provider referral for case management and/or care coordination services.   Assessments/Interventions:  Review of past medical history, allergies, medications, health status, including review of consultants reports, laboratory and other test data, was performed as part of comprehensive evaluation and provision of chronic care management services.  SDOH (Social Determinants of Health) assessments and interventions performed: SDOH Interventions    Flowsheet Row Patient Outreach Telephone from 03/15/2023 in [place] POPULATION HEALTH DEPARTMENT Patient Outreach Telephone from 02/13/2023 in [place] POPULATION HEALTH DEPARTMENT Clinical Support from 01/11/2023 in [facility] at [facility] Patient Outreach Telephone from 12/27/2022 in [place] POPULATION HEALTH DEPARTMENT Patient Outreach Telephone from 10/27/2022 in [place] POPULATION HEALTH DEPARTMENT Patient Outreach Telephone from 09/21/2022 in [place] POPULATION HEALTH DEPARTMENT  SDOH Interventions        Food Insecurity Interventions -- -- Intervention Not Indicated -- Intervention Not Indicated --  Housing Interventions -- -- Intervention Not Indicated -- Intervention Not  Indicated --  Transportation Interventions -- -- Intervention Not Indicated, Patient Resources Dietitian) -- -- Intervention Not Indicated  [[name]]  Utilities Interventions -- -- Intervention Not Indicated -- -- --  Alcohol Usage Interventions -- -- Intervention Not Indicated (Score <7) -- -- --  Depression Interventions/Treatment  -- -- PHQ2-9 Score <4 Follow-up Not Indicated -- -- --  Financial Strain Interventions Intervention Not Indicated -- Intervention Not Indicated -- -- --  Physical Activity Interventions -- -- Intervention Not Indicated Intervention Not Indicated -- --  Stress Interventions Intervention Not Indicated -- Intervention Not Indicated -- -- --  Social Connections Interventions -- Intervention Not Indicated Intervention Not Indicated -- -- --  Health Literacy Interventions -- Intervention Not Indicated Intervention Not Indicated Intervention Not Indicated -- --     Care Plan Allergies  Allergen Reactions   Betadine [Povidone Iodine] Hives   Fish Allergy Hives   Shellfish-Derived Products Hives    Medications Reviewed Today   Medications were not reviewed in this encounter    Patient Active Problem List   Diagnosis Date Noted   Drug overdose, intentional self-harm, initial encounter (HCC) 01/24/2023   Insomnia 01/22/2023   Drug overdose, multiple drugs, intentional self-harm, initial encounter (HCC) 01/21/2023   Hypothyroidism 01/21/2023   Mild intermittent asthma 01/21/2023   Seizure (HCC) 01/16/2023   Major depressive disorder, recurrent episode, severe, without psychosis (HCC) 11/11/2022   Intentional overdose (HCC) 11/08/2022   Amenorrhea 09/05/2022   Possible pregnancy 09/05/2022   Symptomatic cholelithiasis 02/16/2022   Peritoneal adhesions without obstruction 02/16/2022   Hematemesis 01/14/2022   Encounter for supervision of other normal pregnancy, first trimester 11/12/2021   Morbid obesity (HCC) 09/30/2021   Acquired hypothyroidism  06/25/2021   Migraine without aura and without status migrainosus, not intractable 03/05/2021   Mild intermittent asthma without complication 12/01/2020   Gastroesophageal reflux disease 12/01/2020  Hypoglycemia 12/01/2020   Rheumatoid factor positive 12/09/2019   SS-A antibody positive 10/28/2019   Chronic pain of both knees 10/28/2019   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   Schizoaffective disorder, depressive type (HCC) 02/02/2018   Suicide attempt (HCC) 01/16/2018   Dysfunctional uterine bleeding 01/06/2018   Borderline personality disorder (HCC) 01/04/2018   PTSD (post-traumatic stress disorder) 01/03/2018   ASCUS of cervix with negative high risk HPV 08/28/2017   Malingering 05/06/2016   Conditions to be addressed/monitored per PCP order:  Chronic healthcare management needs, asthma, ostoarthritis, migraines, seizures, GERD, PTSD/depression/hypothyroidism, HLD, BP, personality disorder, pseudoseizures, schizoaffective disorder  Care Plan : RN Care Manager Plan of Care  Updates made by Danie Chandler, RN since 03/15/2023 12:00 AM     Problem: Health Promotion or Disease Self-Management (General Plan of Care)      Long-Range Goal: Chronic Disease Management   Start Date: 08/22/2022  Expected End Date: 06/15/2023  Priority: High  Note:   Current Barriers:  Knowledge Deficits related to plan of care for management of migraines, asthma, GERD, PTSD, borderline personality disorder, schizoaffective disorder, chronic pain, hypothyroid, osteoarthritis  Chronic Disease Management support and education needs related to  Knowledge Deficits related to plan of care for management of migraines, asthma, GERD, PTSD, borderline personality disorder, schizoaffective disorder, chronic pain, hypothyroid, osteoarthritis 03/15/23:  ORTHO appt tomorrow for right knee-using cane prn.  Started on Mobic by Rheumatology.  Seen in ED 10/10 for viral illness.  BP and breathing stable.  RNCM Clinical  Goal(s):  Patient will verbalize understanding of plan for management of migraines, asthma, GERD, PTSD, borderline personality disorder, schizoaffective disorder, chronic pain, hypothyroid, osteoarthritis  verbalize basic understanding of  migraines, asthma, GERD, PTSD, borderline personality disorder, schizoaffective disorder, chronic pain, hypothyroid, osteoarthritis  take all medications exactly as prescribed and will call provider for medication related questions as evidenced by patient report demonstrate understanding of rationale for each prescribed medication as evidenced by patient report attend all scheduled medical appointments as evidenced by patient report and EMR review demonstrate ongoing  adherence to prescribed treatment plan for migraines, asthma, GERD, PTSD, borderline personality disorder, schizoaffective disorder, chronic pain, hypothyroid, osteoarthritis  continue to work with RN Care Manager to address care management and care coordination needs related to migraines, asthma, GERD, PTSD, borderline personality disorder, schizoaffective disorder, chronic pain, hypothyroid, osteoarthritis  experience decrease in ED visits as evidenced by EMR review.  ED visits in in last 6 months = 16 through collaboration with RN Care manager, provider, and care team.   Interventions: Inter-disciplinary care team collaboration (see longitudinal plan of care) Evaluation of current treatment plan related to  self management and patient's adherence to plan as established by provider  Asthma: (Status:New goal.) Long Term Goal Advised patient to track and manage Asthma triggers Advised patient to self assesses Asthma action plan zone and make appointment with provider if in the yellow zone for 48 hours without improvement Provided education about and advised patient to utilize infection prevention strategies to reduce risk of respiratory infection Discussed the importance of adequate rest and  management of fatigue with Asthma Assessed social determinant of health barriers   Pain Interventions:  (Status:  New goal.) Long Term Goal Pain assessment performed Medications reviewed Reviewed provider established plan for pain management Discussed importance of adherence to all scheduled medical appointments Counseled on the importance of reporting any/all new or changed pain symptoms or management strategies to pain management provider Advised patient to report to care team affect of pain  on daily activities Discussed use of relaxation techniques and/or diversional activities to assist with pain reduction (distraction, imagery, relaxation, massage, acupressure, TENS, heat, and cold application Reviewed with patient prescribed pharmacological and nonpharmacological pain relief strategies Assessed social determinant of health barriers  Patient Goals/Self-Care Activities: Take all medications as prescribed Attend all scheduled provider appointments Call pharmacy for medication refills 3-7 days in advance of running out of medications Perform all self care activities independently  Perform IADL's (shopping, preparing meals, housekeeping, managing finances) independently Call provider office for new concerns or questions   Follow Up Plan:  The patient has been provided with contact information for the care management team and has been advised to call with any health related questions or concerns.  The care management team will reach out to the patient again over the next 60 business  days.    Long-Range Goal: Establish Plan of Care for Chronic Disease Management Needs   Priority: High  Note:   Timeframe:  Long-Range Goal Priority:  High Start Date:  08/22/22                           Expected End Date: ongoing                      Follow Up Date 05/15/23   - practice safe sex - schedule appointment for vaccines needed due to my age or health - schedule recommended health tests  (blood work, mammogram, colonoscopy, pap test) - schedule and keep appointment for annual check-up    Why is this important?   Screening tests can find diseases early when they are easier to treat.  Your doctor or nurse will talk with you about which tests are important for you.  Getting shots for common diseases like the flu and shingles will help prevent them.  03/15/23:  ORTHO appt tomorrow, ACT 1-2 times a week   Follow Up:  Patient agrees to Care Plan and Follow-up.  Plan: The Managed Medicaid care management team will reach out to the patient again over the next 60 business  days. and The  Patient has been provided with contact information for the Managed Medicaid care management team and has been advised to call with any health related questions or concerns.  Date/time of next scheduled RN care management/care coordination outreach:  05/15/23 at 0900.

## 2023-03-28 ENCOUNTER — Ambulatory Visit
Admission: EM | Admit: 2023-03-28 | Discharge: 2023-03-28 | Disposition: A | Discharge status: Home / Self Care | Payer: Medicaid Other | Attending: Emergency Medicine | Admitting: Emergency Medicine

## 2023-03-28 DIAGNOSIS — Z1152 Encounter for screening for COVID-19: Secondary | ICD-10-CM | POA: Insufficient documentation

## 2023-03-28 DIAGNOSIS — J069 Acute upper respiratory infection, unspecified: Secondary | ICD-10-CM

## 2023-03-28 LAB — RESP PANEL BY RT-PCR (FLU A&B, COVID) ARPGX2
Influenza A by PCR: NEGATIVE
Influenza B by PCR: NEGATIVE
SARS Coronavirus 2 by RT PCR: NEGATIVE

## 2023-03-28 LAB — GROUP A STREP BY PCR: Group A Strep by PCR: NOT DETECTED

## 2023-03-28 MED ORDER — ONDANSETRON HCL 4 MG PO TABS: 4.0000 mg | ORAL_TABLET | Freq: Four times a day (QID) | ORAL | 0 refills | Status: DC

## 2023-03-28 MED ORDER — IPRATROPIUM BROMIDE 0.06 % NA SOLN: 2.0000 | Freq: Four times a day (QID) | NASAL | 12 refills | Status: DC

## 2023-03-28 MED ORDER — BENZONATATE 100 MG PO CAPS: 200.0000 mg | ORAL_CAPSULE | Freq: Three times a day (TID) | ORAL | 0 refills | Status: DC

## 2023-03-28 MED ORDER — PROMETHAZINE-DM 6.25-15 MG/5ML PO SYRP: 5.0000 mL | ORAL_SOLUTION | Freq: Four times a day (QID) | ORAL | 0 refills | Status: DC | PRN

## 2023-03-28 NOTE — Discharge Instructions (Addendum)
Your testing today did not reveal the presence of strep, COVID, or influenza.  I do believe you have a respiratory virus which is causing your symptoms.  Use over-the-counter Tylenol and/or ibuprofen according the package instructions as needed for fever or bodyaches.  Use the Zofran every 6 hours as needed for nausea and vomiting.  Use the Atrovent nasal spray, 2 squirts in each nostril every 6 hours, as needed for runny nose and postnasal drip.  Use the Tessalon Perles every 8 hours during the day.  Take them with a small sip of water.  They may give you some numbness to the base of your tongue or a metallic taste in your mouth, this is normal.  Use the Promethazine DM cough syrup at bedtime for cough and congestion.  It will make you drowsy so do not take it during the day.  Return for reevaluation or see your primary care provider for any new or worsening symptoms.

## 2023-03-28 NOTE — ED Triage Notes (Addendum)
Sx started last night nauseas-vomiting-sore throat-congestion-fatigue-body aches. She's taken theraflu today around noon  Right ear pain since Friday.

## 2023-03-28 NOTE — ED Provider Notes (Signed)
[facility]    CSN: [id number] Arrival date & time: 03/28/23  1631      History   Chief Complaint Chief Complaint  Patient presents with   Sore Throat   Fever   Fatigue   Generalized Body Aches   Otalgia    HPI Melanie Bell is a 32 y.o. female.   HPI  32 year old female with a past medical history significant for thyroid disease, tardive dyskinesia, seizures, hyperlipidemia, GERD, asthma, PTSD, borderline personality disorder, and schizoaffective disorder presents for evaluation of respiratory symptoms that started abruptly last night.  She has been experiencing pain in the right ear for the last 4 days but last night she developed a fever with a Tmax of 1-1.4, runny nose, nasal congestion, sore throat, fatigue, body aches, nausea, and vomiting.  Her stepson is currently ill with a virus that turned into pneumonia.  She denies any other sick contacts or recent travel.  Past Medical History:  Diagnosis Date   Allergy    Anxiety    Arthritis    Asthma    Depression    Foreign body aspiration    GERD (gastroesophageal reflux disease)    Hallucinations 09/30/2014   Sizoaffective   Hyperlipidemia    Hypertension    Hypoglycemia    Intentional ingestion of batteries 04/21/2016    2 AAA batteries and 1 thumb tack; intent to hurt herself/notes 04/21/2016   Seizure (HCC) 01/16/2023   Self-inflicted injury 11/23/2017   Suicidal ideation 01/02/2018   Tardive dyskinesia 10/2014   recent onset   Thyroid disease     Patient Active Problem List   Diagnosis Date Noted   Drug overdose, intentional self-harm, initial encounter (HCC) 01/24/2023   Insomnia 01/22/2023   Drug overdose, multiple drugs, intentional self-harm, initial encounter (HCC) 01/21/2023   Hypothyroidism 01/21/2023   Mild intermittent asthma 01/21/2023   Seizure (HCC) 01/16/2023   Major depressive disorder, recurrent episode, severe, without psychosis (HCC) 11/11/2022   Intentional overdose  (HCC) 11/08/2022   Amenorrhea 09/05/2022   Possible pregnancy 09/05/2022   Symptomatic cholelithiasis 02/16/2022   Peritoneal adhesions without obstruction 02/16/2022   Hematemesis 01/14/2022   Encounter for supervision of other normal pregnancy, first trimester 11/12/2021   Morbid obesity (HCC) 09/30/2021   Acquired hypothyroidism 06/25/2021   Migraine without aura and without status migrainosus, not intractable 03/05/2021   Mild intermittent asthma without complication 12/01/2020   Gastroesophageal reflux disease 12/01/2020   Hypoglycemia 12/01/2020   Rheumatoid factor positive 12/09/2019   SS-A antibody positive 10/28/2019   Chronic pain of both knees 10/28/2019   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   Schizoaffective disorder, depressive type (HCC) 02/02/2018   Suicide attempt (HCC) 01/16/2018   Dysfunctional uterine bleeding 01/06/2018   Borderline personality disorder (HCC) 01/04/2018   PTSD (post-traumatic stress disorder) 01/03/2018   ASCUS of cervix with negative high risk HPV 08/28/2017   Malingering 05/06/2016    Past Surgical History:  Procedure Laterality Date   ABDOMINAL SURGERY     "years ago" to remove foreign objects   APPENDECTOMY     BREAST EXCISIONAL BIOPSY Right 09/03/2007   surgical bx removed fibroadeoma   BREAST LUMPECTOMY Right    CHOLECYSTECTOMY  02/2022   COLONOSCOPY WITH PROPOFOL N/A 09/10/2015   Procedure: COLONOSCOPY WITH PROPOFOL;  Surgeon: Christena Deem, MD;  Location: [facility] ENDOSCOPY;  Service: Endoscopy;  Laterality: N/A;   ESOPHAGOGASTRODUODENOSCOPY N/A 11/28/2014   Procedure: ESOPHAGOGASTRODUODENOSCOPY (EGD);  Surgeon: Scot Jun, MD;  Location: [facility] ENDOSCOPY;  Service:  Endoscopy;  Laterality: N/A;   ESOPHAGOGASTRODUODENOSCOPY N/A 02/21/2016   Procedure: ESOPHAGOGASTRODUODENOSCOPY (EGD);  Surgeon: Napoleon Form, MD;  Location: [facility] ENDOSCOPY;  Service: Endoscopy;  Laterality: N/A;   ESOPHAGOGASTRODUODENOSCOPY N/A 04/21/2016    Procedure: ESOPHAGOGASTRODUODENOSCOPY (EGD);  Surgeon: Iva Boop, MD;  Location: [facility] ENDOSCOPY;  Service: Endoscopy;  Laterality: N/A;   ESOPHAGOGASTRODUODENOSCOPY N/A 05/06/2016   Procedure: ESOPHAGOGASTRODUODENOSCOPY (EGD);  Surgeon: Wyline Mood, MD;  Location: [facility] ENDOSCOPY;  Service: Gastroenterology;  Laterality: N/A;   ESOPHAGOGASTRODUODENOSCOPY N/A 06/13/2016   Procedure: ESOPHAGOGASTRODUODENOSCOPY (EGD);  Surgeon: Midge Minium, MD;  Location: [facility] ENDOSCOPY;  Service: Endoscopy;  Laterality: N/A;   ESOPHAGOGASTRODUODENOSCOPY N/A 01/14/2022   Procedure: ESOPHAGOGASTRODUODENOSCOPY (EGD);  Surgeon: Toledo, Boykin Nearing, MD;  Location: [facility] ENDOSCOPY;  Service: Gastroenterology;  Laterality: N/A;   ESOPHAGOGASTRODUODENOSCOPY (EGD) WITH PROPOFOL N/A 02/29/2016   Procedure: ESOPHAGOGASTRODUODENOSCOPY (EGD) WITH PROPOFOL;  Surgeon: Midge Minium, MD;  Location: [facility] ENDOSCOPY;  Service: Endoscopy;  Laterality: N/A;   ESOPHAGOGASTRODUODENOSCOPY (EGD) WITH PROPOFOL N/A 01/15/2018   Procedure: ESOPHAGOGASTRODUODENOSCOPY (EGD) WITH PROPOFOL;  Surgeon: Corbin Ade, MD;  Location: [facility] ENDO SUITE;  Service: Endoscopy;  Laterality: N/A;  retrieval forein body   LAPAROSCOPIC LYSIS OF ADHESIONS  02/16/2022   Procedure: LAPAROSCOPIC LYSIS OF ADHESIONS;  Surgeon: Henrene Dodge, MD;  Location: [facility] ORS;  Service: General;;   LAPAROTOMY N/A 09/12/2015   Procedure: EXPLORATORY LAPAROTOMY;  Surgeon: Lattie Haw, MD;  Location: [facility] ORS;  Service: General;  Laterality: N/A;   SIGMOIDOSCOPY N/A 09/12/2015   Procedure: Daiva Huge;  Surgeon: Lattie Haw, MD;  Location: [facility] ORS;  Service: General;  Laterality: N/A;   WISDOM TOOTH EXTRACTION      OB History     Gravida  5   Para  0   Term  0   Preterm  0   AB  3   Living  0      SAB  3   IAB  0   Ectopic  0   Multiple  0   Live Births               Home Medications    Prior to Admission medications   Medication Sig  Start Date End Date Taking? Authorizing Provider  albuterol (VENTOLIN HFA) 108 (90 Base) MCG/ACT inhaler 2 puffs four times a day as needed Patient taking differently: Inhale 2 puffs into the lungs every 8 (eight) hours as needed for wheezing or shortness of breath. 12/14/22  Yes Brimage, Vondra, DO  benzonatate (TESSALON) 100 MG capsule Take 2 capsules (200 mg total) by mouth every 8 (eight) hours. 03/28/23  Yes Becky Augusta, NP  cabergoline (DOSTINEX) 0.5 MG tablet Take 1 tablet (0.5 mg total) by mouth once a week. 11/14/22  Yes [name], DO  ipratropium (ATROVENT) 0.06 % nasal spray Place 2 sprays into both nostrils 4 (four) times daily. 03/28/23  Yes Becky Augusta, NP  levothyroxine (SYNTHROID) 75 MCG tablet Take 1 tablet (75 mcg total) by mouth daily before breakfast. 11/14/22  Yes [name], DO  meloxicam (MOBIC) 15 MG tablet Take 15 mg by mouth daily.   Yes [provider]  ondansetron (ZOFRAN) 4 MG tablet Take 1 tablet (4 mg total) by mouth every 6 (six) hours. 03/28/23  Yes Becky Augusta, NP  promethazine-dextromethorphan (PROMETHAZINE-DM) 6.25-15 MG/5ML syrup Take 5 mLs by mouth 4 (four) times daily as needed. 03/28/23  Yes Becky Augusta, NP  sertraline (ZOLOFT) 50 MG tablet Take 50 mg by mouth daily.  Yes [provider]  ARIPiprazole (ABILIFY) 15 MG tablet Take 0.5 tablets (7.5 mg total) by mouth daily for 2 days. 01/29/23 01/31/23  Charm Rings, NP  Cyanocobalamin (VITAMIN B-12 PO) Take 1 capsule by mouth daily.    [provider]  furosemide (LASIX) 20 MG tablet Take 1 tablet (20 mg total) by mouth daily. 01/30/23   [name], NP  Magnesium 500 MG CAPS Take 500 mg by mouth at bedtime.    [provider]  omeprazole (PRILOSEC) 20 MG capsule TAKE 1 CAPSULE (20 MG TOTAL) BY MOUTH DAILY. 06/07/22   [name], MD  ondansetron (ZOFRAN-ODT) 4 MG disintegrating tablet Take 1 tablet (4 mg total) by mouth every 8 (eight) hours  as needed. 02/23/23   Fisher, Roselyn Bering, PA-C    Family History Family History  Problem Relation Age of Onset   Depression Mother    Hypertension Mother    Sleep apnea Mother    Asthma Mother    COPD Mother    Diabetes Mother    Anxiety disorder Mother    Arthritis Mother    Heart failure Mother    Breast cancer Maternal Aunt        20's   Stroke Maternal Grandmother    Heart attack Maternal Grandfather     Social History Social History   Tobacco Use   Smoking status: Former    Current packs/day: 0.00    Average packs/day: 0.5 packs/day for 3.0 years (1.5 ttl pk-yrs)    Types: Cigarettes    Start date: 08/14/2013    Quit date: 08/14/2016    Years since quitting: 6.6    Passive exposure: Past   Smokeless tobacco: Never  Vaping Use   Vaping status: Never Used  Substance Use Topics   Alcohol use: No   Drug use: No     Allergies   Betadine [povidone iodine], Fish allergy, and Shellfish-derived products   Review of Systems Review of Systems  Constitutional:  Positive for fever.  HENT:  Positive for congestion, ear pain, rhinorrhea and sore throat.   Respiratory:  Positive for cough.   Gastrointestinal:  Positive for nausea and vomiting.  Musculoskeletal:  Positive for arthralgias and myalgias.  Skin:  Negative for rash.     Physical Exam Triage Vital Signs ED Triage Vitals  Encounter Vitals Group     BP      Systolic BP Percentile      Diastolic BP Percentile      Pulse      Resp      Temp      Temp src      SpO2      Weight      Height      Head Circumference      Peak Flow      Pain Score      Pain Loc      Pain Education      Exclude from Growth Chart    No data found.  Updated Vital Signs BP 134/66 (BP Location: Left Arm)   Pulse 99   Temp 99 F (37.2 C) (Oral)   Resp 18   LMP 03/23/2023 (Approximate)   SpO2 100%   Visual Acuity Right Eye Distance:   Left Eye Distance:   Bilateral Distance:    Right Eye Near:   Left Eye Near:     Bilateral Near:     Physical Exam Vitals and nursing note reviewed.  Constitutional:  Appearance: Normal appearance. She is ill-appearing.  HENT:     Head: Normocephalic and atraumatic.     Right Ear: Tympanic membrane, ear canal and external ear normal. There is no impacted cerumen.     Left Ear: Tympanic membrane, ear canal and external ear normal. There is no impacted cerumen.     Nose: Congestion and rhinorrhea present.     Comments: Patient mucosa is erythematous and edematous with clear discharge in both nares.    Mouth/Throat:     Mouth: Mucous membranes are moist.     Pharynx: Oropharyngeal exudate and posterior oropharyngeal erythema present.     Comments: Tonsillar pillars are 2+ edematous and erythematous with white exudate. Cardiovascular:     Rate and Rhythm: Normal rate and regular rhythm.     Pulses: Normal pulses.     Heart sounds: Normal heart sounds. No murmur heard.    No friction rub. No gallop.  Pulmonary:     Effort: Pulmonary effort is normal.     Breath sounds: Normal breath sounds. No wheezing, rhonchi or rales.  Musculoskeletal:     Cervical back: Normal range of motion and neck supple.  Lymphadenopathy:     Cervical: No cervical adenopathy.  Skin:    General: Skin is warm and dry.     Capillary Refill: Capillary refill takes less than 2 seconds.     Findings: No rash.  Neurological:     General: No focal deficit present.     Mental Status: She is alert and oriented to person, place, and time.      UC Treatments / Results  Labs (all labs ordered are listed, but only abnormal results are displayed) Labs Reviewed  GROUP A STREP BY PCR  RESP PANEL BY RT-PCR (FLU A&B, COVID) [id number]    EKG   Radiology No results found.  Procedures Procedures (including critical care time)  Medications Ordered in UC Medications - No data to display  Initial Impression / Assessment and Plan / UC Course  I have reviewed the triage vital signs and the  nursing notes.  Pertinent labs & imaging results that were available during my care of the patient were reviewed by me and considered in my medical decision making (see chart for details).   Patient is a pleasant, though mildly ill-appearing, 32 year old female presenting for evaluation of acute onset of respiratory symptoms as outlined HPI above.  She does have inflamed nasal mucosa with clear rhinorrhea but also edematous tonsillar pillars with white exudate.  Given that she has been febrile with a Tmax of one 101.4 I am concerned for possible COVID, strep, or influenza and I will order PCR's of both.  Strep PCR is negative.  Respiratory panel is negative for COVID or influenza.  I will discharge patient home with a diagnosis of viral URI with a cough with a prescription for Zofran to help with the nausea and vomiting along with Atrovent nasal spray help with nasal congestion, Tessalon Perles and Promethazine DM cough syrup for cough and congestion.  She may continue to use over-the-counter Tylenol and/or ibuprofen as needed for fever.  Return precautions reviewed.  Work note provided.   Final Clinical Impressions(s) / UC Diagnoses   Final diagnoses:  Viral URI with cough     Discharge Instructions      Your testing today did not reveal the presence of strep, COVID, or influenza.  I do believe you have a respiratory virus which is causing your symptoms.  Use over-the-counter  Tylenol and/or ibuprofen according the package instructions as needed for fever or bodyaches.  Use the Zofran every 6 hours as needed for nausea and vomiting.  Use the Atrovent nasal spray, 2 squirts in each nostril every 6 hours, as needed for runny nose and postnasal drip.  Use the Tessalon Perles every 8 hours during the day.  Take them with a small sip of water.  They may give you some numbness to the base of your tongue or a metallic taste in your mouth, this is normal.  Use the Promethazine DM cough syrup  at bedtime for cough and congestion.  It will make you drowsy so do not take it during the day.  Return for reevaluation or see your primary care provider for any new or worsening symptoms.      ED Prescriptions     Medication Sig Dispense Auth. Provider   benzonatate (TESSALON) 100 MG capsule Take 2 capsules (200 mg total) by mouth every 8 (eight) hours. 21 capsule Becky Augusta, NP   ipratropium (ATROVENT) 0.06 % nasal spray Place 2 sprays into both nostrils 4 (four) times daily. 15 mL Becky Augusta, NP   promethazine-dextromethorphan (PROMETHAZINE-DM) 6.25-15 MG/5ML syrup Take 5 mLs by mouth 4 (four) times daily as needed. 118 mL Becky Augusta, NP   ondansetron (ZOFRAN) 4 MG tablet Take 1 tablet (4 mg total) by mouth every 6 (six) hours. 12 tablet Becky Augusta, NP      PDMP not reviewed this encounter.   Becky Augusta, NP 03/28/23 1810

## 2023-04-03 ENCOUNTER — Telehealth: Payer: Medicaid Other | Admitting: Family Medicine

## 2023-04-03 DIAGNOSIS — J029 Acute pharyngitis, unspecified: Secondary | ICD-10-CM

## 2023-04-03 MED ORDER — AMOXICILLIN 500 MG PO TABS: 500.0000 mg | ORAL_TABLET | Freq: Two times a day (BID) | ORAL | 0 refills | Status: AC

## 2023-04-03 MED ORDER — LIDOCAINE VISCOUS HCL 2 % MT SOLN: 15.0000 mL | OROMUCOSAL | 0 refills | Status: DC | PRN

## 2023-04-03 NOTE — Patient Instructions (Addendum)
[name], thank you for joining Freddy Finner, NP for today's virtual visit.  While this provider is not your primary care provider (PCP), if your PCP is located in our provider database this encounter information will be shared with them immediately following your visit.   A [facility] MyChart account gives you access to today's visit and all your visits, tests, and labs performed at [facility] " click here if you don't have a [place] [facility] MyChart account or go to [email or web address]  Consent: (Patient) Melanie Bell provided verbal consent for this virtual visit at the beginning of the encounter.  Current Medications:  Current Outpatient Medications:    amoxicillin (AMOXIL) 500 MG tablet, Take 1 tablet (500 mg total) by mouth 2 (two) times daily for 10 days., Disp: 20 tablet, Rfl: 0   lidocaine (XYLOCAINE) 2 % solution, Use as directed 15 mLs in the mouth or throat as needed for mouth pain., Disp: 100 mL, Rfl: 0   albuterol (VENTOLIN HFA) 108 (90 Base) MCG/ACT inhaler, 2 puffs four times a day as needed (Patient taking differently: Inhale 2 puffs into the lungs every 8 (eight) hours as needed for wheezing or shortness of breath.), Disp: 18 g, Rfl: 1   ARIPiprazole (ABILIFY) 15 MG tablet, Take 0.5 tablets (7.5 mg total) by mouth daily for 2 days., Disp: 1 tablet, Rfl: 0   benzonatate (TESSALON) 100 MG capsule, Take 2 capsules (200 mg total) by mouth every 8 (eight) hours., Disp: 21 capsule, Rfl: 0   cabergoline (DOSTINEX) 0.5 MG tablet, Take 1 tablet (0.5 mg total) by mouth once a week., Disp: 12 tablet, Rfl: 3   Cyanocobalamin (VITAMIN B-12 PO), Take 1 capsule by mouth daily., Disp: , Rfl:    furosemide (LASIX) 20 MG tablet, Take 1 tablet (20 mg total) by mouth daily., Disp: 30 tablet, Rfl: 0   ipratropium (ATROVENT) 0.06 % nasal spray, Place 2 sprays into both nostrils 4 (four) times daily., Disp: 15 mL, Rfl: 12   levothyroxine (SYNTHROID) 75 MCG tablet,  Take 1 tablet (75 mcg total) by mouth daily before breakfast., Disp: 90 tablet, Rfl: 3   Magnesium 500 MG CAPS, Take 500 mg by mouth at bedtime., Disp: , Rfl:    meloxicam (MOBIC) 15 MG tablet, Take 15 mg by mouth daily., Disp: , Rfl:    omeprazole (PRILOSEC) 20 MG capsule, TAKE 1 CAPSULE (20 MG TOTAL) BY MOUTH DAILY., Disp: 90 capsule, Rfl: 2   ondansetron (ZOFRAN) 4 MG tablet, Take 1 tablet (4 mg total) by mouth every 6 (six) hours., Disp: 12 tablet, Rfl: 0   ondansetron (ZOFRAN-ODT) 4 MG disintegrating tablet, Take 1 tablet (4 mg total) by mouth every 8 (eight) hours as needed., Disp: 20 tablet, Rfl: 0   promethazine-dextromethorphan (PROMETHAZINE-DM) 6.25-15 MG/5ML syrup, Take 5 mLs by mouth 4 (four) times daily as needed., Disp: 118 mL, Rfl: 0   sertraline (ZOLOFT) 50 MG tablet, Take 50 mg by mouth daily., Disp: , Rfl:    Medications ordered in this encounter:  Meds ordered this encounter  Medications   amoxicillin (AMOXIL) 500 MG tablet    Sig: Take 1 tablet (500 mg total) by mouth 2 (two) times daily for 10 days.    Dispense:  20 tablet    Refill:  0    Order Specific Question:   Supervising Provider    Answer:   Merrilee Jansky [[id number]]   lidocaine (XYLOCAINE) 2 % solution    Sig: Use as directed  15 mLs in the mouth or throat as needed for mouth pain.    Dispense:  100 mL    Refill:  0    Order Specific Question:   Supervising Provider    Answer:   Merrilee Jansky [[id number]]     *If you need refills on other medications prior to your next appointment, please contact your pharmacy*  Follow-Up: Call back or seek an in-person evaluation if the symptoms worsen or if the condition fails to improve as anticipated.  [facility] [phone]  Other Instructions   -Suspect bacterial pharyngitis given duration and symptoms that are worsening  - Discussed OTC management - Will prescribe Viscous lidocaine for symptomatic relief, along with ABX for bacterial  coverage - May use tylenol and ibuprofen alternating every 3-4 hours as needed for pain, fevers, body aches - Salt water gargles - Hydration and voice rest - Seek in person evaluation if worsening or fails to improve   If you have been instructed to have an in-person evaluation today at a local Urgent Care facility, please use the link below. It will take you to a list of all of our available [facility], including address, phone number and hours of operation. Please do not delay care.  [facility]  If you or a family member do not have a primary care provider, use the link below to schedule a visit and establish care. When you choose a [place] primary care physician or advanced practice provider, you gain a long-term partner in health. Find a Primary Care Provider  Learn more about [facility]'s in-office and virtual care options:  - Get Care Now

## 2023-04-03 NOTE — Progress Notes (Signed)
Virtual Visit Consent   Melanie Bell, you are scheduled for a virtual visit with a [facility] provider today. Just as with appointments in the office, your consent must be obtained to participate. Your consent will be active for this visit and any virtual visit you may have with one of our providers in the next 365 days. If you have a MyChart account, a copy of this consent can be sent to you electronically.  As this is a virtual visit, video technology does not allow for your provider to perform a traditional examination. This may limit your provider's ability to fully assess your condition. If your provider identifies any concerns that need to be evaluated in person or the need to arrange testing (such as labs, EKG, etc.), we will make arrangements to do so. Although advances in technology are sophisticated, we cannot ensure that it will always work on either your end or our end. If the connection with a video visit is poor, the visit may have to be switched to a telephone visit. With either a video or telephone visit, we are not always able to ensure that we have a secure connection.  By engaging in this virtual visit, you consent to the provision of healthcare and authorize for your insurance to be billed (if applicable) for the services provided during this visit. Depending on your insurance coverage, you may receive a charge related to this service.  I need to obtain your verbal consent now. Are you willing to proceed with your visit today? Melanie Bell has provided verbal consent on 04/03/2023 for a virtual visit (video or telephone). Freddy Finner, NP  Date: 04/03/2023 2:04 PM  Virtual Visit via Video Note   I, Freddy Finner, connected with  Melanie Bell  ([id number], June 18, 1990) on 04/03/23 at  2:00 PM EST by a video-enabled telemedicine application and verified that I am speaking with the correct person using two identifiers.  Location: Patient: Virtual Visit  Location Patient: Home Provider: Virtual Visit Location Provider: Home Office   I discussed the limitations of evaluation and management by telemedicine and the availability of in person appointments. The patient expressed understanding and agreed to proceed.    History of Present Illness: Melanie Bell is a 32 y.o. who identifies as a female who was assigned female at birth, and is being seen today for sore throat and on going URI   Onset was 03/24/2023 with URI and sore throat- was treated as such note in EPIC Associated symptoms are fatigue, very sore throat, swollen, hard to talk, neck is achy, congestion, fever 100-100.9 Modifying factors are OTC, plus perles, and nasal spray and prometh DM and zofran  Denies chest pain, shortness of breath   Problems:  Patient Active Problem List   Diagnosis Date Noted   Drug overdose, intentional self-harm, initial encounter (HCC) 01/24/2023   Insomnia 01/22/2023   Drug overdose, multiple drugs, intentional self-harm, initial encounter (HCC) 01/21/2023   Hypothyroidism 01/21/2023   Mild intermittent asthma 01/21/2023   Seizure (HCC) 01/16/2023   Major depressive disorder, recurrent episode, severe, without psychosis (HCC) 11/11/2022   Intentional overdose (HCC) 11/08/2022   Amenorrhea 09/05/2022   Possible pregnancy 09/05/2022   Symptomatic cholelithiasis 02/16/2022   Peritoneal adhesions without obstruction 02/16/2022   Hematemesis 01/14/2022   Encounter for supervision of other normal pregnancy, first trimester 11/12/2021   Morbid obesity (HCC) 09/30/2021   Acquired hypothyroidism 06/25/2021   Migraine without aura and without status migrainosus, not intractable  03/05/2021   Mild intermittent asthma without complication 12/01/2020   Gastroesophageal reflux disease 12/01/2020   Hypoglycemia 12/01/2020   Rheumatoid factor positive 12/09/2019   SS-A antibody positive 10/28/2019   Chronic pain of both knees 10/28/2019    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   Schizoaffective disorder, depressive type (HCC) 02/02/2018   Suicide attempt (HCC) 01/16/2018   Dysfunctional uterine bleeding 01/06/2018   Borderline personality disorder (HCC) 01/04/2018   PTSD (post-traumatic stress disorder) 01/03/2018   ASCUS of cervix with negative high risk HPV 08/28/2017   Malingering 05/06/2016    Allergies:  Allergies  Allergen Reactions   Betadine [Povidone Iodine] Hives   Fish Allergy Hives   Shellfish-Derived Products Hives   Medications:  Current Outpatient Medications:    albuterol (VENTOLIN HFA) 108 (90 Base) MCG/ACT inhaler, 2 puffs four times a day as needed (Patient taking differently: Inhale 2 puffs into the lungs every 8 (eight) hours as needed for wheezing or shortness of breath.), Disp: 18 g, Rfl: 1   ARIPiprazole (ABILIFY) 15 MG tablet, Take 0.5 tablets (7.5 mg total) by mouth daily for 2 days., Disp: 1 tablet, Rfl: 0   benzonatate (TESSALON) 100 MG capsule, Take 2 capsules (200 mg total) by mouth every 8 (eight) hours., Disp: 21 capsule, Rfl: 0   cabergoline (DOSTINEX) 0.5 MG tablet, Take 1 tablet (0.5 mg total) by mouth once a week., Disp: 12 tablet, Rfl: 3   Cyanocobalamin (VITAMIN B-12 PO), Take 1 capsule by mouth daily., Disp: , Rfl:    furosemide (LASIX) 20 MG tablet, Take 1 tablet (20 mg total) by mouth daily., Disp: 30 tablet, Rfl: 0   ipratropium (ATROVENT) 0.06 % nasal spray, Place 2 sprays into both nostrils 4 (four) times daily., Disp: 15 mL, Rfl: 12   levothyroxine (SYNTHROID) 75 MCG tablet, Take 1 tablet (75 mcg total) by mouth daily before breakfast., Disp: 90 tablet, Rfl: 3   Magnesium 500 MG CAPS, Take 500 mg by mouth at bedtime., Disp: , Rfl:    meloxicam (MOBIC) 15 MG tablet, Take 15 mg by mouth daily., Disp: , Rfl:    omeprazole (PRILOSEC) 20 MG capsule, TAKE 1 CAPSULE (20 MG TOTAL) BY MOUTH DAILY., Disp: 90 capsule, Rfl: 2   ondansetron (ZOFRAN) 4 MG tablet, Take 1 tablet (4 mg total) by  mouth every 6 (six) hours., Disp: 12 tablet, Rfl: 0   ondansetron (ZOFRAN-ODT) 4 MG disintegrating tablet, Take 1 tablet (4 mg total) by mouth every 8 (eight) hours as needed., Disp: 20 tablet, Rfl: 0   promethazine-dextromethorphan (PROMETHAZINE-DM) 6.25-15 MG/5ML syrup, Take 5 mLs by mouth 4 (four) times daily as needed., Disp: 118 mL, Rfl: 0   sertraline (ZOLOFT) 50 MG tablet, Take 50 mg by mouth daily., Disp: , Rfl:   Observations/Objective: Patient is well-developed, well-nourished in no acute distress.  Resting comfortably  at home.  Head is normocephalic, atraumatic.  No labored breathing.  Speech is clear and coherent with logical content.  Patient is alert and oriented at baseline.  Hoarseness noted, mild muffled voice   Assessment and Plan:  1. Pharyngitis, unspecified etiology  - amoxicillin (AMOXIL) 500 MG tablet; Take 1 tablet (500 mg total) by mouth 2 (two) times daily for 10 days.  Dispense: 20 tablet; Refill: 0 - lidocaine (XYLOCAINE) 2 % solution; Use as directed 15 mLs in the mouth or throat as needed for mouth pain.  Dispense: 100 mL; Refill: 0   -Suspect bacterial pharyngitis given duration and symptoms that are worsening  - Discussed  OTC management - Will prescribe Viscous lidocaine for symptomatic relief, along with ABX for bacterial coverage - May use tylenol and ibuprofen alternating every 3-4 hours as needed for pain, fevers, body aches - Salt water gargles - Hydration and voice rest - Seek in person evaluation if worsening or fails to improve   Reviewed side effects, risks and benefits of medication.    Patient acknowledged agreement and understanding of the plan.   Past Medical, Surgical, Social History, Allergies, and Medications have been Reviewed.     Follow Up Instructions: I discussed the assessment and treatment plan with the patient. The patient was provided an opportunity to ask questions and all were answered. The patient agreed with the  plan and demonstrated an understanding of the instructions.  A copy of instructions were sent to the patient via MyChart unless otherwise noted below.     The patient was advised to call back or seek an in-person evaluation if the symptoms worsen or if the condition fails to improve as anticipated.    Freddy Finner, NP

## 2023-04-18 ENCOUNTER — Encounter: Payer: Self-pay | Admitting: Internal Medicine

## 2023-04-18 ENCOUNTER — Ambulatory Visit (INDEPENDENT_AMBULATORY_CARE_PROVIDER_SITE_OTHER): Payer: Medicaid Other | Admitting: Internal Medicine

## 2023-04-18 VITALS — BP 122/74 | HR 72 | Ht 67.0 in | Wt 280.0 lb

## 2023-04-18 DIAGNOSIS — R1312 Dysphagia, oropharyngeal phase: Secondary | ICD-10-CM | POA: Diagnosis not present

## 2023-04-18 DIAGNOSIS — R112 Nausea with vomiting, unspecified: Secondary | ICD-10-CM | POA: Diagnosis not present

## 2023-04-18 DIAGNOSIS — N912 Amenorrhea, unspecified: Secondary | ICD-10-CM | POA: Diagnosis not present

## 2023-04-18 DIAGNOSIS — E221 Hyperprolactinemia: Secondary | ICD-10-CM

## 2023-04-18 DIAGNOSIS — E039 Hypothyroidism, unspecified: Secondary | ICD-10-CM | POA: Diagnosis not present

## 2023-04-18 LAB — POCT URINE PREGNANCY: Preg Test, Ur: NEGATIVE

## 2023-04-18 NOTE — Progress Notes (Signed)
Date:  04/18/2023   Name:  Melanie Bell   DOB:  06-24-1990   MRN:  [id number]   Chief Complaint: Nausea (Nausea and fatigue. Patient said she has not had a menstrual cycle since mid October and it only lasted for 2 days.) and Dysphagia (Difficulty swallowing certain foods, and sometimes choking. Started within the last month or 2. Pt said her mother has the same problem.)  Emesis  This is a recurrent problem. The current episode started more than 1 month ago. Episode frequency: almost every morning. The problem has been unchanged. The emesis has an appearance of stomach contents. There has been no fever. Associated symptoms include abdominal pain. Pertinent negatives include no chest pain, chills, diarrhea, dizziness, fever or headaches.  Thyroid Problem Presents for follow-up visit. Symptoms include menstrual problem. Patient reports no anxiety, constipation, diarrhea, fatigue or palpitations.  Dysphagia - frequent choking from food bolus - at the upper esophagus.  She has to cough and try to expel the food.  It is usually with meat, no matter how thoroughly she chews. Irregular menses - last cycle in October, very light and short.  No bleeding last month.  Home pregnancy negative but wants a recheck.  Sees GYN but no recent evaluation.  Not on any contraception.   Review of Systems  Constitutional:  Negative for chills, fatigue and fever.  HENT:  Positive for trouble swallowing.   Respiratory:  Negative for chest tightness and shortness of breath.   Cardiovascular:  Negative for chest pain, palpitations and leg swelling.  Gastrointestinal:  Positive for abdominal pain, nausea and vomiting. Negative for abdominal distention, blood in stool, constipation and diarrhea.  Genitourinary:  Positive for menstrual problem.  Neurological:  Negative for dizziness, light-headedness and headaches.  Psychiatric/Behavioral:  Negative for dysphoric mood and sleep disturbance. The patient is not  nervous/anxious.      Lab Results  Component Value Date   NA 137 01/22/2023   K 3.8 01/22/2023   CO2 24 01/22/2023   GLUCOSE 95 01/22/2023   BUN 13 01/22/2023   CREATININE 0.81 01/22/2023   CALCIUM 9.2 01/22/2023   GFRNONAA >60 01/22/2023   Lab Results  Component Value Date   CHOL 153 01/26/2023   HDL 40 (L) 01/26/2023   LDLCALC 103 (H) 01/26/2023   TRIG 48 01/26/2023   CHOLHDL 3.8 01/26/2023   Lab Results  Component Value Date   TSH 1.12 05/02/2022   Lab Results  Component Value Date   HGBA1C 5.4 01/26/2023   Lab Results  Component Value Date   WBC 10.5 01/21/2023   HGB 13.4 01/21/2023   HCT 41.3 01/21/2023   MCV 93.2 01/21/2023   PLT 203 01/21/2023   Lab Results  Component Value Date   ALT 12 01/22/2023   AST 16 01/22/2023   ALKPHOS 51 01/22/2023   BILITOT 0.9 01/22/2023   No results found for: "25OHVITD2", "25OHVITD3", "VD25OH"   Patient Active Problem List   Diagnosis Date Noted   Amenorrhea 04/18/2023   Oropharyngeal dysphagia 04/18/2023   Drug overdose, intentional self-harm, initial encounter (HCC) 01/24/2023   Insomnia 01/22/2023   Drug overdose, multiple drugs, intentional self-harm, initial encounter (HCC) 01/21/2023   Mild intermittent asthma 01/21/2023   Seizure (HCC) 01/16/2023   Major depressive disorder, recurrent episode, severe, without psychosis (HCC) 11/11/2022   Intentional overdose (HCC) 11/08/2022   Morbid obesity (HCC) 09/30/2021   Acquired hypothyroidism 06/25/2021   Migraine without aura and without status migrainosus, not intractable 03/05/2021  Mild intermittent asthma without complication 12/01/2020   Gastroesophageal reflux disease 12/01/2020   Hypoglycemia 12/01/2020   Rheumatoid factor positive 12/09/2019   SS-A antibody positive 10/28/2019   Chronic pain of both knees 10/28/2019   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   Schizoaffective disorder, depressive type (HCC) 02/02/2018   Suicide attempt (HCC) 01/16/2018    Borderline personality disorder (HCC) 01/04/2018   PTSD (post-traumatic stress disorder) 01/03/2018   ASCUS of cervix with negative high risk HPV 08/28/2017   Malingering 05/06/2016    Allergies  Allergen Reactions   Betadine [Povidone Iodine] Hives   Fish Allergy Hives   Shellfish-Derived Products Hives    Past Surgical History:  Procedure Laterality Date   ABDOMINAL SURGERY     "years ago" to remove foreign objects   APPENDECTOMY     BREAST EXCISIONAL BIOPSY Right 09/03/2007   surgical bx removed fibroadeoma   BREAST LUMPECTOMY Right    CHOLECYSTECTOMY  02/2022   COLONOSCOPY WITH PROPOFOL N/A 09/10/2015   Procedure: COLONOSCOPY WITH PROPOFOL;  Surgeon: Christena Deem, MD;  Location: [facility] ENDOSCOPY;  Service: Endoscopy;  Laterality: N/A;   ESOPHAGOGASTRODUODENOSCOPY N/A 11/28/2014   Procedure: ESOPHAGOGASTRODUODENOSCOPY (EGD);  Surgeon: Scot Jun, MD;  Location: [facility] ENDOSCOPY;  Service: Endoscopy;  Laterality: N/A;   ESOPHAGOGASTRODUODENOSCOPY N/A 02/21/2016   Procedure: ESOPHAGOGASTRODUODENOSCOPY (EGD);  Surgeon: Napoleon Form, MD;  Location[facility] ENDOSCOPY;  Service: Endoscopy;  Laterality: N/A;   ESOPHAGOGASTRODUODENOSCOPY N/A 04/21/2016   Procedure: ESOPHAGOGASTRODUODENOSCOPY (EGD);  Surgeon: Iva Boop, MD;  Location: [facility] ENDOSCOPY;  Service: Endoscopy;  Laterality: N/A;   ESOPHAGOGASTRODUODENOSCOPY N/A 05/06/2016   Procedure: ESOPHAGOGASTRODUODENOSCOPY (EGD);  Surgeon: Wyline Mood, MD;  Location: [facility] ENDOSCOPY;  Service: Gastroenterology;  Laterality: N/A;   ESOPHAGOGASTRODUODENOSCOPY N/A 06/13/2016   Procedure: ESOPHAGOGASTRODUODENOSCOPY (EGD);  Surgeon: Midge Minium, MD;  Location: [facility] ENDOSCOPY;  Service: Endoscopy;  Laterality: N/A;   ESOPHAGOGASTRODUODENOSCOPY N/A 01/14/2022   Procedure: ESOPHAGOGASTRODUODENOSCOPY (EGD);  Surgeon: Toledo, Boykin Nearing, MD;  Location: [facility] ENDOSCOPY;  Service: Gastroenterology;  Laterality: N/A;    ESOPHAGOGASTRODUODENOSCOPY (EGD) WITH PROPOFOL N/A 02/29/2016   Procedure: ESOPHAGOGASTRODUODENOSCOPY (EGD) WITH PROPOFOL;  Surgeon: Midge Minium, MD;  Location: [facility] ENDOSCOPY;  Service: Endoscopy;  Laterality: N/A;   ESOPHAGOGASTRODUODENOSCOPY (EGD) WITH PROPOFOL N/A 01/15/2018   Procedure: ESOPHAGOGASTRODUODENOSCOPY (EGD) WITH PROPOFOL;  Surgeon: Corbin Ade, MD;  Location: [facility] ENDO SUITE;  Service: Endoscopy;  Laterality: N/A;  retrieval forein body   LAPAROSCOPIC LYSIS OF ADHESIONS  02/16/2022   Procedure: LAPAROSCOPIC LYSIS OF ADHESIONS;  Surgeon: Henrene Dodge, MD;  Location: [facility] ORS;  Service: General;;   LAPAROTOMY N/A 09/12/2015   Procedure: EXPLORATORY LAPAROTOMY;  Surgeon: Lattie Haw, MD;  Location: [facility] ORS;  Service: General;  Laterality: N/A;   SIGMOIDOSCOPY N/A 09/12/2015   Procedure: Daiva Huge;  Surgeon: Lattie Haw, MD;  Location: [facility] ORS;  Service: General;  Laterality: N/A;   WISDOM TOOTH EXTRACTION      Social History   Tobacco Use   Smoking status: Former    Current packs/day: 0.00    Average packs/day: 0.5 packs/day for 3.0 years (1.5 ttl pk-yrs)    Types: Cigarettes    Start date: 08/14/2013    Quit date: 08/14/2016    Years since quitting: 6.6    Passive exposure: Past   Smokeless tobacco: Never  Vaping Use   Vaping status: Never Used  Substance Use Topics   Alcohol use: No   Drug use: No     Medication list has been reviewed and updated.  Current  Meds  Medication Sig   albuterol (VENTOLIN HFA) 108 (90 Base) MCG/ACT inhaler 2 puffs four times a day as needed (Patient taking differently: Inhale 2 puffs into the lungs every 8 (eight) hours as needed for wheezing or shortness of breath.)   cabergoline (DOSTINEX) 0.5 MG tablet Take 1 tablet (0.5 mg total) by mouth once a week.   Cyanocobalamin (VITAMIN B-12 PO) Take 1 capsule by mouth daily.   ipratropium (ATROVENT) 0.06 % nasal spray Place 2 sprays into both nostrils 4 (four) times daily.    levothyroxine (SYNTHROID) 75 MCG tablet Take 1 tablet (75 mcg total) by mouth daily before breakfast.   lidocaine (XYLOCAINE) 2 % solution Use as directed 15 mLs in the mouth or throat as needed for mouth pain.   Magnesium 500 MG CAPS Take 500 mg by mouth at bedtime.   meloxicam (MOBIC) 15 MG tablet Take 15 mg by mouth daily.   omeprazole (PRILOSEC) 20 MG capsule TAKE 1 CAPSULE (20 MG TOTAL) BY MOUTH DAILY.   ondansetron (ZOFRAN) 4 MG tablet Take 1 tablet (4 mg total) by mouth every 6 (six) hours.   sertraline (ZOLOFT) 50 MG tablet Take 50 mg by mouth daily.       04/18/2023    9:14 AM 06/20/2022    1:51 PM 01/12/2022    3:01 PM 09/03/2021    9:15 AM  GAD 7 : Generalized Anxiety Score  Nervous, Anxious, on Edge 0 1 0 0  Control/stop worrying 0 0 0 0  Worry too much - different things 0 0 0 0  Trouble relaxing 0 0 0 0  Restless 0 0 0 0  Easily annoyed or irritable 0 0 0 0  Afraid - awful might happen 0 0 0 0  Total GAD 7 Score 0 1 0 0  Anxiety Difficulty Not difficult at all Not difficult at all Not difficult at all        04/18/2023    9:14 AM 01/11/2023    1:13 PM 06/20/2022    1:51 PM  Depression screen PHQ 2/9  Decreased Interest 0 0 0  Down, Depressed, Hopeless 0 0 0  PHQ - 2 Score 0 0 0  Altered sleeping 1 0 1  Tired, decreased energy 1 0 0  Change in appetite 0 0 1  Feeling bad or failure about yourself  0 0 0  Trouble concentrating 0 0 0  Moving slowly or fidgety/restless 0 0 0  Suicidal thoughts 0 0 0  PHQ-9 Score 2 0 2  Difficult doing work/chores Not difficult at all Not difficult at all Not difficult at all    BP Readings from Last 3 Encounters:  04/18/23 122/74  03/28/23 134/66  02/23/23 138/65    Physical Exam Constitutional:      Appearance: Normal appearance. She is obese.  Neck:     Vascular: No carotid bruit.  Cardiovascular:     Rate and Rhythm: Normal rate and regular rhythm.     Heart sounds: No murmur heard. Pulmonary:     Effort: Pulmonary  effort is normal.     Breath sounds: Normal breath sounds.  Abdominal:     Palpations: Abdomen is soft.     Tenderness: There is generalized abdominal tenderness. There is no right CVA tenderness, left CVA tenderness, guarding or rebound.  Musculoskeletal:     Cervical back: Normal range of motion. No tenderness.  Lymphadenopathy:     Cervical: No cervical adenopathy.  Neurological:  Mental Status: She is alert.     Wt Readings from Last 3 Encounters:  04/18/23 280 lb (127 kg)  02/23/23 274 lb (124.3 kg)  01/20/23 270 lb (122.5 kg)    BP 122/74   Pulse 72   Ht 5\' 7"  (1.702 m)   Wt 280 lb (127 kg)   LMP 02/28/2023 (Approximate)   SpO2 97%   BMI 43.85 kg/m   Assessment and Plan:  Problem List Items Addressed This Visit       Unprioritized   Acquired hypothyroidism (Chronic)    On levothyroxine regularly Followed by Endo but overdue for recheck No menses since October      Relevant Orders   TSH+T4F+T3Free   Amenorrhea - Primary    Refer back to GYN      Relevant Orders   POCT urine pregnancy (Completed)   Hyperprolactinemia (HCC)    Followed by Endo  Unsure of last visit      Oropharyngeal dysphagia    EGD last year was essentially normal. No obvious cause/medication  Continue PPI and refer to GI      Relevant Orders   Ambulatory referral to Gastroenterology   Other Visit Diagnoses     Nausea and vomiting, unspecified vomiting type       Relevant Orders   Ambulatory referral to Gastroenterology   Basic metabolic panel       No follow-ups on file.    [name], MD [facility] [place]

## 2023-04-18 NOTE — Assessment & Plan Note (Signed)
EGD last year was essentially normal. No obvious cause/medication  Continue PPI and refer to GI

## 2023-04-18 NOTE — Assessment & Plan Note (Signed)
Followed by Jackey Loge of last visit

## 2023-04-18 NOTE — Assessment & Plan Note (Signed)
On levothyroxine regularly Followed by Endo but overdue for recheck No menses since October

## 2023-04-18 NOTE — Assessment & Plan Note (Signed)
Refer back to GYN.

## 2023-04-19 ENCOUNTER — Ambulatory Visit
Admission: EM | Admit: 2023-04-19 | Discharge: 2023-04-19 | Discharge status: Against Medical Advice | Payer: Medicaid Other | Attending: Emergency Medicine | Admitting: Emergency Medicine

## 2023-04-19 DIAGNOSIS — M25561 Pain in right knee: Secondary | ICD-10-CM | POA: Diagnosis not present

## 2023-04-19 DIAGNOSIS — M79661 Pain in right lower leg: Secondary | ICD-10-CM

## 2023-04-19 LAB — BASIC METABOLIC PANEL
BUN/Creatinine Ratio: 17 (ref 9–23)
BUN: 12 mg/dL (ref 6–20)
CO2: 22 mmol/L (ref 20–29)
Calcium: 9.6 mg/dL (ref 8.7–10.2)
Chloride: 106 mmol/L (ref 96–106)
Creatinine, Ser: 0.72 mg/dL (ref 0.57–1.00)
Glucose: 93 mg/dL (ref 70–99)
Potassium: 4.5 mmol/L (ref 3.5–5.2)
Sodium: 141 mmol/L (ref 134–144)
eGFR: 113 mL/min/{1.73_m2} (ref >59)

## 2023-04-19 LAB — TSH+T4F+T3FREE
Free T4: 1.61 ng/dL (ref 0.82–1.77)
T3, Free: 2.5 pg/mL (ref 2.0–4.4)
TSH: 0.894 u[IU]/mL (ref 0.450–4.500)

## 2023-04-19 MED ORDER — ACETAMINOPHEN 325 MG PO TABS: 975.0000 mg | ORAL_TABLET | Freq: Once | ORAL | Status: DC

## 2023-04-19 NOTE — Discharge Instructions (Signed)
I am concerned that you could have a blood clot in your leg causing your pain.  Please go to the [facility] ED to evaluate for this.  Let them know if your pain changes, gets worse, if you start having chest pain, shortness of breath.

## 2023-04-19 NOTE — ED Triage Notes (Addendum)
Pt presents to UC c/o RT knee pain onset x1 week, pt states pain has gotten worse, pt denies any fall. Pt states she has noticed it "cracking" and "popping" pt has taken tylenol, ice, and heat with no relief. Pt states she does have osteo and rheumatoid arthritis.

## 2023-04-19 NOTE — ED Notes (Addendum)
Pt left before finishing visit. As Dr.Mortenson was heading back to room to inform pt that she is concerned about a poss DVT in leg. Called pt x2 unable to reach x2.

## 2023-04-19 NOTE — ED Provider Notes (Signed)
HPI  SUBJECTIVE:  Melanie Bell is a 32 y.o. female who presents with 1 week of worsening atraumatic right knee pain, popping, cracking, grinding sensation.  She reports knee swelling and states it feels unstable.  She reports throbbing pain along the popliteal fossa, calf pain, distal numbness and tingling.  No swelling along the popliteal fossa.  No fall, trauma, denies twisting her knee.  No change in physical activity.  No erythema, fevers.  No cough, chest pain, hemoptysis, surgery in the past 4 weeks, recent immobilization, OCP use.  She tried elevating her knee, using a cane, Tylenol, alternating heat and ice and a muscle rub.  The elevation helps with the swelling.  She takes meloxicam on a regular basis for osteoarthritis.  Symptoms are worse with weightbearing and normal activity.  She has had symptoms like this before and was found to have osteoarthritis.  Patient has a past medical history of asthma, rheumatoid arthritis, osteoarthritis, hypertension, seizures, hyper extendable knees, and traumatic effusion to the right knee.  No history to the ACL PCL, meniscus.  No history of DVT, PE.  LMP: October.  Had negative urine pregnancy test yesterday.  PCP: [facility].  She has an appointment with a [facility] orthopedics in 6 days but came here because she could not stand the pain any more.  Past Medical History:  Diagnosis Date   Allergy    Anxiety    Arthritis    Asthma    Depression    Foreign body aspiration    GERD (gastroesophageal reflux disease)    Hallucinations 09/30/2014   Sizoaffective   Hematemesis 01/14/2022   Hyperlipidemia    Hypertension    Hypoglycemia    Intentional ingestion of batteries 04/21/2016    2 AAA batteries and 1 thumb tack; intent to hurt herself/notes 04/21/2016   Peritoneal adhesions without obstruction 02/16/2022   Seizure (HCC) 01/16/2023   Self-inflicted injury 11/23/2017   Suicidal ideation 01/02/2018   Tardive dyskinesia  10/2014   recent onset   Thyroid disease     Past Surgical History:  Procedure Laterality Date   ABDOMINAL SURGERY     "years ago" to remove foreign objects   APPENDECTOMY     BREAST EXCISIONAL BIOPSY Right 09/03/2007   surgical bx removed fibroadeoma   BREAST LUMPECTOMY Right    CHOLECYSTECTOMY  02/2022   COLONOSCOPY WITH PROPOFOL N/A 09/10/2015   Procedure: COLONOSCOPY WITH PROPOFOL;  Surgeon: Christena Deem, MD;  Location: [facility] ENDOSCOPY;  Service: Endoscopy;  Laterality: N/A;   ESOPHAGOGASTRODUODENOSCOPY N/A 11/28/2014   Procedure: ESOPHAGOGASTRODUODENOSCOPY (EGD);  Surgeon: Scot Jun, MD;  Location: [facility] ENDOSCOPY;  Service: Endoscopy;  Laterality: N/A;   ESOPHAGOGASTRODUODENOSCOPY N/A 02/21/2016   Procedure: ESOPHAGOGASTRODUODENOSCOPY (EGD);  Surgeon: Napoleon Form, MD;  Location: [facility] ENDOSCOPY;  Service: Endoscopy;  Laterality: N/A;   ESOPHAGOGASTRODUODENOSCOPY N/A 04/21/2016   Procedure: ESOPHAGOGASTRODUODENOSCOPY (EGD);  Surgeon: Iva Boop, MD;  Location: [facility] ENDOSCOPY;  Service: Endoscopy;  Laterality: N/A;   ESOPHAGOGASTRODUODENOSCOPY N/A 05/06/2016   Procedure: ESOPHAGOGASTRODUODENOSCOPY (EGD);  Surgeon: Wyline Mood, MD;  Location: [facility] ENDOSCOPY;  Service: Gastroenterology;  Laterality: N/A;   ESOPHAGOGASTRODUODENOSCOPY N/A 06/13/2016   Procedure: ESOPHAGOGASTRODUODENOSCOPY (EGD);  Surgeon: Midge Minium, MD;  Location: [facility] ENDOSCOPY;  Service: Endoscopy;  Laterality: N/A;   ESOPHAGOGASTRODUODENOSCOPY N/A 01/14/2022   Procedure: ESOPHAGOGASTRODUODENOSCOPY (EGD);  Surgeon: Toledo, Boykin Nearing, MD;  Location: [facility] ENDOSCOPY;  Service: Gastroenterology;  Laterality: N/A;   ESOPHAGOGASTRODUODENOSCOPY (EGD) WITH PROPOFOL N/A 02/29/2016   Procedure: ESOPHAGOGASTRODUODENOSCOPY (EGD)  WITH PROPOFOL;  Surgeon: Midge Minium, MD;  Location: [facility] ENDOSCOPY;  Service: Endoscopy;  Laterality: N/A;   ESOPHAGOGASTRODUODENOSCOPY (EGD) WITH PROPOFOL N/A 01/15/2018   Procedure:  ESOPHAGOGASTRODUODENOSCOPY (EGD) WITH PROPOFOL;  Surgeon: Corbin Ade, MD;  Location: [facility] ENDO SUITE;  Service: Endoscopy;  Laterality: N/A;  retrieval forein body   LAPAROSCOPIC LYSIS OF ADHESIONS  02/16/2022   Procedure: LAPAROSCOPIC LYSIS OF ADHESIONS;  Surgeon: Henrene Dodge, MD;  Location: [facility] ORS;  Service: General;;   LAPAROTOMY N/A 09/12/2015   Procedure: EXPLORATORY LAPAROTOMY;  Surgeon: Lattie Haw, MD;  Location: [facility] ORS;  Service: General;  Laterality: N/A;   SIGMOIDOSCOPY N/A 09/12/2015   Procedure: Daiva Huge;  Surgeon: Lattie Haw, MD;  Location: [facility] ORS;  Service: General;  Laterality: N/A;   WISDOM TOOTH EXTRACTION      Family History  Problem Relation Age of Onset   Depression Mother    Hypertension Mother    Sleep apnea Mother    Asthma Mother    COPD Mother    Diabetes Mother    Anxiety disorder Mother    Arthritis Mother    Heart failure Mother    Breast cancer Maternal Aunt        20's   Stroke Maternal Grandmother    Heart attack Maternal Grandfather     Social History   Tobacco Use   Smoking status: Former    Current packs/day: 0.00    Average packs/day: 0.5 packs/day for 3.0 years (1.5 ttl pk-yrs)    Types: Cigarettes    Start date: 08/14/2013    Quit date: 08/14/2016    Years since quitting: 6.6    Passive exposure: Past   Smokeless tobacco: Never  Vaping Use   Vaping status: Never Used  Substance Use Topics   Alcohol use: No   Drug use: No     Current Facility-Administered Medications:    acetaminophen (TYLENOL) tablet 975 mg, 975 mg, Oral, Once, Domenick Gong, MD  Current Outpatient Medications:    albuterol (VENTOLIN HFA) 108 (90 Base) MCG/ACT inhaler, 2 puffs four times a day as needed (Patient taking differently: Inhale 2 puffs into the lungs every 8 (eight) hours as needed for wheezing or shortness of breath.), Disp: 18 g, Rfl: 1   ARIPiprazole (ABILIFY) 15 MG tablet, Take 0.5 tablets (7.5 mg total) by mouth daily  for 2 days., Disp: 1 tablet, Rfl: 0   cabergoline (DOSTINEX) 0.5 MG tablet, Take 1 tablet (0.5 mg total) by mouth once a week., Disp: 12 tablet, Rfl: 3   Cyanocobalamin (VITAMIN B-12 PO), Take 1 capsule by mouth daily., Disp: , Rfl:    ipratropium (ATROVENT) 0.06 % nasal spray, Place 2 sprays into both nostrils 4 (four) times daily., Disp: 15 mL, Rfl: 12   levothyroxine (SYNTHROID) 75 MCG tablet, Take 1 tablet (75 mcg total) by mouth daily before breakfast., Disp: 90 tablet, Rfl: 3   lidocaine (XYLOCAINE) 2 % solution, Use as directed 15 mLs in the mouth or throat as needed for mouth pain., Disp: 100 mL, Rfl: 0   Magnesium 500 MG CAPS, Take 500 mg by mouth at bedtime., Disp: , Rfl:    meloxicam (MOBIC) 15 MG tablet, Take 15 mg by mouth daily., Disp: , Rfl:    omeprazole (PRILOSEC) 20 MG capsule, TAKE 1 CAPSULE (20 MG TOTAL) BY MOUTH DAILY., Disp: 90 capsule, Rfl: 2   ondansetron (ZOFRAN) 4 MG tablet, Take 1 tablet (4 mg total) by mouth every 6 (six) hours., Disp: 12  tablet, Rfl: 0   sertraline (ZOLOFT) 50 MG tablet, Take 50 mg by mouth daily., Disp: , Rfl:   Allergies  Allergen Reactions   Betadine [Povidone Iodine] Hives   Fish Allergy Hives   Shellfish-Derived Products Hives     ROS  As noted in HPI.   Physical Exam  BP (!) 155/97 (BP Location: Right Arm)   Pulse 76   Temp 98.8 F (37.1 C) (Oral)   LMP 02/28/2023 (Approximate)   SpO2 96%   Constitutional: Well developed, well nourished, appears uncomfortable Eyes:  EOMI, conjunctiva normal bilaterally HENT: Normocephalic, atraumatic,mucus membranes moist Respiratory: Normal inspiratory effort Cardiovascular: Normal rate GI: nondistended skin: No rash, skin intact Musculoskeletal: R Knee ROM decreased due to pain, Flexion  intact, Tenderness entire joint, Patella NT, Patellar tendon NT, Medial joint  tender, Lateral joint tender, Popliteal region tender, palpable cord, no appreciable swelling, no evidence of a Baker's cyst.   Varus MCL stress testing painful but stable, Valgus LCL stress testing painful but stable, ACL/PCL stable, McMurray positive,  Distal NVI with intact baseline sensation / motor / pulse distal to knee.  No appreciable effusion. No erythema. No increased temperature.  Positive crepitus.  Positive posterior calf tenderness.  Positive Homans' sign.  Positive tenderness to medial thigh.  No palpable cord.  Calves grossly symmetric.  No edema. Neurologic: Alert & oriented x 3, no focal neuro deficits Psychiatric: Speech and behavior appropriate   ED Course   Medications  acetaminophen (TYLENOL) tablet 975 mg (has no administration in time range)    No orders of the defined types were placed in this encounter.   No results found for this or any previous visit (from the past 24 hour(s)). No results found.  ED Clinical Impression  1. Acute pain of right knee   2. Right calf pain      ED Assessment/Plan     Giving Tylenol 975 mg p.o. x 1 here.  Unable to give shot of Toradol as patient is on meloxicam daily for osteoarthritis.  With the popping and crepitus, this could certainly be osteoarthritis.  There is no appreciable infection/effusion.  However, she does have calf tenderness, positive Homans' sign and tenderness along the medial thigh which she does not have on the other side.  I am concerned that she could have a DVT.  Do not have ultrasound right now or D-dimer available.  Could be a ruptured Baker's cyst or Baker's cyst.  Transferring to the emergency department for rule out DVT.  She is stable to go via private vehicle.  Patient left prior to formal discharge.  Attempted to call the patient and advise her to go to the emergency department to rule out DVT. No answer. Left message.  Staff also attempted to call the patient and left a message.  Meds ordered this encounter  Medications   acetaminophen (TYLENOL) tablet 975 mg      *This clinic note was created using Administrator, sports. Therefore, there may be occasional mistakes despite careful proofreading.  ?    Domenick Gong, MD 04/19/23 1714

## 2023-04-27 ENCOUNTER — Other Ambulatory Visit: Payer: Self-pay

## 2023-04-27 DIAGNOSIS — E039 Hypothyroidism, unspecified: Secondary | ICD-10-CM

## 2023-04-27 MED ORDER — LEVOTHYROXINE SODIUM 75 MCG PO TABS: 75.0000 ug | ORAL_TABLET | Freq: Every day | ORAL | 0 refills | Status: DC

## 2023-05-15 ENCOUNTER — Other Ambulatory Visit: Payer: Self-pay | Admitting: Obstetrics and Gynecology

## 2023-05-15 NOTE — Patient Outreach (Signed)
Medicaid Managed Care   Nurse Care Manager Note  05/15/2023 Name:  Quanique Krakowski MRN:  [id number] DOB:  08-30-1990  Melanie Bell is an 32 y.o. year old female who is a primary patient of [name], [name], MD.  The [facility] Managed Care Coordination team was consulted for assistance with:    Chronic healthcare management needs, asthma, osteoarthritis, migraines, pseudoseizures, GERD, PTSD/depression/BP/personality disorder/schizoaffective disorder, hypothyroid, HLD  Ms. Picton was given information about Medicaid Managed Care Coordination team services today. Sylvie Farrier Ettinger Patient agreed to services and verbal consent obtained.  Engaged with patient by telephone for follow up visit in response to provider referral for case management and/or care coordination services.   Patient is participating in a Managed Medicaid Plan:  NO  Assessments/Interventions:  Review of past medical history, allergies, medications, health status, including review of consultants reports, laboratory and other test data, was performed as part of comprehensive evaluation and provision of chronic care management services.  SDOH (Social Drivers of Health) assessments and interventions performed: SDOH Interventions    Flowsheet Row Patient Outreach Telephone from 05/15/2023 in [place] [facility] POP[facility]ALTH DEPARTMENT Office Visit from 04/18/2023 in [facility] at [facility] Patient Outreach Telephone from 03/15/2023 in [place] POPULATION HEALTH DEPARTMENT Patient Outreach Telephone from 02/13/2023 in [place] POPULATION HEALTH DEPARTMENT Clinical Support from 01/11/2023 in [facility] Primary [facility] at [facility] Patient Outreach Telephone from 12/27/2022 in [place] POPULATION HEALTH DEPARTMENT  SDOH Interventions        Food Insecurity Interventions -- -- -- -- Intervention Not Indicated --  Housing Interventions -- -- -- -- Intervention  Not Indicated --  Transportation Interventions -- -- -- -- Intervention Not Indicated, Patient Resources Dietitian) --  Utilities Interventions Intervention Not Indicated -- -- -- Intervention Not Indicated --  Alcohol Usage Interventions Intervention Not Indicated (Score <7) -- -- -- Intervention Not Indicated (Score <7) --  Depression Interventions/Treatment  -- Counseling -- -- PHQ2-9 Score <4 Follow-up Not Indicated --  Financial Strain Interventions -- -- Intervention Not Indicated -- Intervention Not Indicated --  Physical Activity Interventions -- -- -- -- Intervention Not Indicated Intervention Not Indicated  Stress Interventions -- -- Intervention Not Indicated -- Intervention Not Indicated --  Social Connections Interventions -- -- -- Intervention Not Indicated Intervention Not Indicated --  Health Literacy Interventions -- -- -- Intervention Not Indicated Intervention Not Indicated Intervention Not Indicated     Care Plan Allergies  Allergen Reactions   Betadine [Povidone Iodine] Hives   Fish Allergy Hives   Shellfish-Derived Products Hives   Medications Reviewed Today     Reviewed by Danie Chandler, RN (Registered Nurse) on 05/15/23 at [phone]  Med List Status: <None>   Medication Order Taking? Sig Documenting Provider Last Dose Status Informant  albuterol (VENTOLIN HFA) 108 (90 Base) MCG/ACT inhaler [id number] No 2 puffs four times a day as needed  Patient taking differently: Inhale 2 puffs into the lungs every 8 (eight) hours as needed for wheezing or shortness of breath.   [name], DO Taking Active Self, Pharmacy Records  ARIPiprazole (ABILIFY) 15 MG tablet [id number]  Take 0.5 tablets (7.5 mg total) by mouth daily for 2 days. Charm Rings, NP  Expired 01/31/23 2359            Med Note Jimmey Ralph, TIFFANY N   Tue Mar 28, 2023  5:14 PM) taking  cabergoline (DOSTINEX) 0.5 MG tablet [id number] No Take  1 tablet (0.5 mg total) by mouth once a week. [name], DO Taking Active Self, Pharmacy Records  Cyanocobalamin (VITAMIN B-12 PO) [id number] No Take 1 capsule by mouth daily. [provider] Taking Active Self, Pharmacy Records  ipratropium (ATROVENT) 0.06 % nasal spray [id number] No Place 2 sprays into both nostrils 4 (four) times daily. Becky Augusta, NP Taking Active   levothyroxine (SYNTHROID) 75 MCG tablet [id number]  Take 1 tablet (75 mcg total) by mouth daily before breakfast. Patient to call office and schedule appointment Shamleffer, Konrad Dolores, MD  Active   lidocaine (XYLOCAINE) 2 % solution [id number] No Use as directed 15 mLs in the mouth or throat as needed for mouth pain. Freddy Finner, NP Taking Active   Magnesium 500 MG CAPS [id number] No Take 500 mg by mouth at bedtime. [provider] Taking Active Self, Pharmacy Records  meloxicam (MOBIC) 15 MG tablet [id number] No Take 15 mg by mouth daily. [provider] Taking Active Self  omeprazole (PRILOSEC) 20 MG capsule [id number] No TAKE 1 CAPSULE (20 MG TOTAL) BY MOUTH DAILY. [name], MD Taking Active Pharmacy Records, Self  ondansetron [facility]) 4 MG tablet [id number] No Take 1 tablet (4 mg total) by mouth every 6 (six) hours. Becky Augusta, NP Taking Active   sertraline (ZOLOFT) 50 MG tablet [id number] No Take 50 mg by mouth daily. 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   Intentional overdose (HCC) 11/08/2022   Morbid obesity (HCC) 09/30/2021   Acquired hypothyroidism  06/25/2021   Migraine without aura and without status migrainosus, not intractable 03/05/2021   Mild intermittent asthma without complication 12/01/2020   Gastroesophageal reflux disease 12/01/2020   Hypoglycemia 12/01/2020   Rheumatoid factor positive 12/09/2019   SS-A antibody positive 10/28/2019   Chronic pain of both knees 10/28/2019   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   Schizoaffective disorder, depressive type (HCC) 02/02/2018   Suicide attempt (HCC) 01/16/2018   Borderline personality disorder (HCC) 01/04/2018   PTSD (post-traumatic stress disorder) 01/03/2018   ASCUS of cervix with negative high risk HPV 08/28/2017   Malingering 05/06/2016   Conditions to be addressed/monitored per PCP order:  Chronic healthcare management needs, asthma, osteoarthritis, migraines, pseudoseizures, GERD, PTSD/depression/BP/personality disorder/schizoaffective disorder, hypothyroid, HLD  Care Plan : RN Care Manager Plan of Care  Updates made by Danie Chandler, RN since 05/15/2023 12:00 AM     Problem: Health Promotion or Disease Self-Management (General Plan of Care)      Long-Range Goal: Chronic Disease Management   Start Date: 08/22/2022  Expected End Date: 06/15/2023  Priority: High  Note:   Current Barriers:  Knowledge Deficits related to plan of care for management of migraines, asthma, GERD, PTSD, borderline personality disorder, schizoaffective disorder, chronic pain, hypothyroid, osteoarthritis  Chronic Disease Management support and education needs related to  Knowledge Deficits related to plan of care for management of migraines, asthma, GERD, PTSD, borderline personality disorder, schizoaffective disorder, chronic pain, hypothyroid, osteoarthritis 05/15/23:  C/O N and V-started 1 month ago with difficulty swallowing-GI referral placed and appt made  for April in [place].  Taking Zofran as needed.  Discussed possibility of appt in [place] or at [facility] to be seen sooner-patient said she will  check with provider.  Seen in ED In Nov. For URI, Seen 12/4 for right knee pain and 12/26 for right calf pain, vomiting, abdominal pain.  Pain better now.  * ED visits in last 6 months.  Possible GYN appt for amenorrhea.  No breathing difficulty currently.  ACT 1-2 times a week.  RNCM Clinical Goal(s):  Patient will verbalize understanding of plan for management of migraines, asthma, GERD, PTSD, borderline personality disorder, schizoaffective disorder, chronic pain, hypothyroid, osteoarthritis  verbalize basic understanding of  migraines, asthma, GERD, PTSD, borderline personality disorder, schizoaffective disorder, chronic pain, hypothyroid, osteoarthritis  take all medications exactly as prescribed and will call provider for medication related questions as evidenced by patient report demonstrate understanding of rationale for each prescribed medication as evidenced by patient report attend all scheduled medical appointments as evidenced by patient report and EMR review demonstrate ongoing  adherence to prescribed treatment plan for migraines, asthma, GERD, PTSD, borderline personality disorder, schizoaffective disorder, chronic pain, hypothyroid, osteoarthritis  continue to work with RN Care Manager to address care management and care coordination needs related to migraines, asthma, GERD, PTSD, borderline personality disorder, schizoaffective disorder, chronic pain, hypothyroid, osteoarthritis  experience decrease in ED visits as evidenced by EMR review.  ED visits in in last 6 months = 16 through collaboration with RN Care manager, provider, and care team.   Interventions: Inter-disciplinary care team collaboration (see longitudinal plan of care) Evaluation of current treatment plan related to  self management and patient's adherence to plan as established by provider  Asthma: (Status:New goal.) Long Term Goal Advised patient to track and manage Asthma triggers Advised patient to self assesses  Asthma action plan zone and make appointment with provider if in the yellow zone for 48 hours without improvement Provided education about and advised patient to utilize infection prevention strategies to reduce risk of respiratory infection Discussed the importance of adequate rest and management of fatigue with Asthma Assessed social determinant of health barriers   Pain Interventions:  (Status:  New goal.) Long Term Goal Pain assessment performed Medications reviewed Reviewed provider established plan for pain management Discussed importance of adherence to all scheduled medical appointments Counseled on the importance of reporting any/all new or changed pain symptoms or management strategies to pain management provider Advised patient to report to care team affect of pain on daily activities Discussed use of relaxation techniques and/or diversional activities to assist with pain reduction (distraction, imagery, relaxation, massage, acupressure, TENS, heat, and cold application Reviewed with patient prescribed pharmacological and nonpharmacological pain relief strategies Assessed social determinant of health barriers  Patient Goals/Self-Care Activities: Take all medications as prescribed Attend all scheduled provider appointments Call pharmacy for medication refills 3-7 days in advance of running out of medications Perform all self care activities independently  Perform IADL's (shopping, preparing meals, housekeeping, managing finances) independently Call provider office for new concerns or questions  05/15/23:  patient to f/u with provider regarding GI appt.  Follow Up Plan:  The patient has been provided with contact information for the care management team and has been advised to call with any health related questions or concerns.  The care management team will reach out to the patient again over the next 60 business  days.    Long-Range Goal: Establish Plan of Care for Chronic  Disease Management Needs   Priority: High  Note:   Timeframe:  Long-Range Goal Priority:  High Start Date:  08/22/22                           Expected End Date: ongoing                      Follow Up Date 06/15/23   - practice safe sex - schedule appointment for vaccines needed due to my age or health - schedule recommended health tests (blood work, mammogram, colonoscopy, pap test) - schedule and keep appointment for annual check-up    Why is this important?   Screening tests can find diseases early when they are easier to treat.  Your doctor or nurse will talk with you about which tests are important for you.  Getting shots for common diseases like the flu and shingles will help prevent them.  05/15/23:  ACT 1-2 times a week.  NEURO in Feb and GI in April   Follow Up:  Patient agrees to Care Plan and Follow-up.  Plan: The Managed Medicaid care management team will reach out to the patient again over the next 30 business  days. and The  Patient has been provided with contact information for the Managed Medicaid care management team and has been advised to call with any health related questions or concerns.  Date/time of next scheduled RN care management/care coordination outreach: 06/15/23 at 0900.

## 2023-05-15 NOTE — Patient Instructions (Signed)
Hi Melanie Bell, I am sorry you are not feeling well-please check with your provider regarding a sooner appointment-have a good day.  Melanie Bell was given information about Medicaid Managed Care team care coordination services and verbally consented to engagement with the [facility] Managed Care team.   Melanie Bell - following are the goals we discussed in your visit today:   Goals Addressed    Timeframe:  Long-Range Goal Priority:  High Start Date:  08/22/22                           Expected End Date: ongoing                      Follow Up Date 06/15/23   - practice safe sex - schedule appointment for vaccines needed due to my age or health - schedule recommended health tests (blood work, mammogram, colonoscopy, pap test) - schedule and keep appointment for annual check-up    Why is this important?   Screening tests can find diseases early when they are easier to treat.  Your doctor or nurse will talk with you about which tests are important for you.  Getting shots for common diseases like the flu and shingles will help prevent them.  05/15/23:  ACT 1-2 times a week.  NEURO in Feb and GI in April  Patient verbalizes understanding of instructions and care plan provided today and agrees to view in MyChart. Active MyChart status and patient understanding of how to access instructions and care plan via MyChart confirmed with patient.     The Managed Medicaid care management team will reach out to the patient again over the next 30 business  days.  The  Patient has been provided with contact information for the Managed Medicaid care management team and has been advised to call with any health related questions or concerns.   Following is a copy of your plan of care:  Care Plan : RN Care Manager Plan of Care  Updates made by Melanie Chandler, RN since 05/15/2023 12:00 AM     Problem: Health Promotion or Disease Self-Management (General Plan of Care)      Long-Range Goal: Chronic Disease  Management   Start Date: 08/22/2022  Expected End Date: 06/15/2023  Priority: High  Note:   Current Barriers:  Knowledge Deficits related to plan of care for management of migraines, asthma, GERD, PTSD, borderline personality disorder, schizoaffective disorder, chronic pain, hypothyroid, osteoarthritis  Chronic Disease Management support and education needs related to  Knowledge Deficits related to plan of care for management of migraines, asthma, GERD, PTSD, borderline personality disorder, schizoaffective disorder, chronic pain, hypothyroid, osteoarthritis 05/15/23:  C/O N and V-started 1 month ago with difficulty swallowing-GI referral placed and appt made for April in [place].  Taking Zofran as needed.  Discussed possibility of appt in [place] or at [facility] to be seen sooner-patient said she will check with provider.  Seen in ED In Nov. For URI, Seen 12/4 for right knee pain and 12/26 for right calf pain, vomiting, abdominal pain.  Pain better now.  * ED visits in last 6 months.  Possible GYN appt for amenorrhea.  No breathing difficulty currently.  ACT 1-2 times a week.  RNCM Clinical Goal(s):  Patient will verbalize understanding of plan for management of migraines, asthma, GERD, PTSD, borderline personality disorder, schizoaffective disorder, chronic pain, hypothyroid, osteoarthritis  verbalize basic understanding of  migraines, asthma, GERD, PTSD,  borderline personality disorder, schizoaffective disorder, chronic pain, hypothyroid, osteoarthritis  take all medications exactly as prescribed and will call provider for medication related questions as evidenced by patient report demonstrate understanding of rationale for each prescribed medication as evidenced by patient report attend all scheduled medical appointments as evidenced by patient report and EMR review demonstrate ongoing  adherence to prescribed treatment plan for migraines, asthma, GERD, PTSD, borderline personality disorder,  schizoaffective disorder, chronic pain, hypothyroid, osteoarthritis  continue to work with RN Care Manager to address care management and care coordination needs related to migraines, asthma, GERD, PTSD, borderline personality disorder, schizoaffective disorder, chronic pain, hypothyroid, osteoarthritis  experience decrease in ED visits as evidenced by EMR review.  ED visits in in last 6 months = 16 through collaboration with RN Care manager, provider, and care team.   Interventions: Inter-disciplinary care team collaboration (see longitudinal plan of care) Evaluation of current treatment plan related to  self management and patient's adherence to plan as established by provider  Asthma: (Status:New goal.) Long Term Goal Advised patient to track and manage Asthma triggers Advised patient to self assesses Asthma action plan zone and make appointment with provider if in the yellow zone for 48 hours without improvement Provided education about and advised patient to utilize infection prevention strategies to reduce risk of respiratory infection Discussed the importance of adequate rest and management of fatigue with Asthma Assessed social determinant of health barriers   Pain Interventions:  (Status:  New goal.) Long Term Goal Pain assessment performed Medications reviewed Reviewed provider established plan for pain management Discussed importance of adherence to all scheduled medical appointments Counseled on the importance of reporting any/all new or changed pain symptoms or management strategies to pain management provider Advised patient to report to care team affect of pain on daily activities Discussed use of relaxation techniques and/or diversional activities to assist with pain reduction (distraction, imagery, relaxation, massage, acupressure, TENS, heat, and cold application Reviewed with patient prescribed pharmacological and nonpharmacological pain relief strategies Assessed social  determinant of health barriers  Patient Goals/Self-Care Activities: Take all medications as prescribed Attend all scheduled provider appointments Call pharmacy for medication refills 3-7 days in advance of running out of medications Perform all self care activities independently  Perform IADL's (shopping, preparing meals, housekeeping, managing finances) independently Call provider office for new concerns or questions  05/15/23:  patient to f/u with provider regarding GI appt.  Follow Up Plan:  The patient has been provided with contact information for the care management team and has been advised to call with any health related questions or concerns.  The care management team will reach out to the patient again over the next 60 business  days.    Melanie Der RN, BSN [place]  [employer] - Managed Medicaid High Risk [phone]

## 2023-05-21 ENCOUNTER — Emergency Department
Admission: EM | Admit: 2023-05-21 | Discharge: 2023-05-21 | Disposition: A | Discharge status: Home / Self Care | Payer: Medicaid Other | Attending: Emergency Medicine | Admitting: Emergency Medicine

## 2023-05-21 ENCOUNTER — Other Ambulatory Visit: Payer: Self-pay

## 2023-05-21 DIAGNOSIS — R1084 Generalized abdominal pain: Secondary | ICD-10-CM | POA: Insufficient documentation

## 2023-05-21 DIAGNOSIS — R112 Nausea with vomiting, unspecified: Secondary | ICD-10-CM | POA: Diagnosis present

## 2023-05-21 LAB — COMPREHENSIVE METABOLIC PANEL
ALT: 11 U/L (ref 0–44)
AST: 14 U/L — ABNORMAL LOW (ref 15–41)
Albumin: 3.9 g/dL (ref 3.5–5.0)
Alkaline Phosphatase: 64 U/L (ref 38–126)
Anion gap: 12 (ref 5–15)
BUN: 15 mg/dL (ref 6–20)
CO2: 24 mmol/L (ref 22–32)
Calcium: 9.6 mg/dL (ref 8.9–10.3)
Chloride: 102 mmol/L (ref 98–111)
Creatinine, Ser: 0.73 mg/dL (ref 0.44–1.00)
GFR, Estimated: >60 mL/min (ref >60)
Glucose, Bld: 91 mg/dL (ref 70–99)
Potassium: 4.6 mmol/L (ref 3.5–5.1)
Sodium: 138 mmol/L (ref 135–145)
Total Bilirubin: 0.7 mg/dL (ref 0.0–1.2)
Total Protein: 7.4 g/dL (ref 6.5–8.1)

## 2023-05-21 LAB — CBC
HCT: 44.7 % (ref 36.0–46.0)
Hemoglobin: 16.2 g/dL — ABNORMAL HIGH (ref 12.0–15.0)
MCH: 33.5 pg (ref 26.0–34.0)
MCHC: 36.2 g/dL — ABNORMAL HIGH (ref 30.0–36.0)
MCV: 92.4 fL (ref 80.0–100.0)
Platelets: 276 10*3/uL (ref 150–400)
RBC: 4.84 MIL/uL (ref 3.87–5.11)
RDW: 13.2 % (ref 11.5–15.5)
WBC: 8.8 10*3/uL (ref 4.0–10.5)
nRBC: 0 % (ref 0.0–0.2)

## 2023-05-21 LAB — LIPASE, BLOOD: Lipase: 38 U/L (ref 11–51)

## 2023-05-21 MED ORDER — METOCLOPRAMIDE HCL 10 MG PO TABS
10.0000 mg | ORAL_TABLET | Freq: Once | ORAL | Status: AC
  Administered 2023-05-21: 10 mg via ORAL
  Filled 2023-05-21: qty 1

## 2023-05-21 MED ORDER — PROMETHAZINE HCL 12.5 MG PO TABS: 12.5000 mg | ORAL_TABLET | Freq: Four times a day (QID) | ORAL | 0 refills | Status: DC | PRN

## 2023-05-21 MED ORDER — HYDROCODONE-ACETAMINOPHEN 5-325 MG PO TABS
1.0000 | ORAL_TABLET | Freq: Once | ORAL | Status: AC
  Administered 2023-05-21: 1 via ORAL
  Filled 2023-05-21: qty 1

## 2023-05-21 MED ORDER — ONDANSETRON HCL 4 MG PO TABS: 4.0000 mg | ORAL_TABLET | Freq: Four times a day (QID) | ORAL | 0 refills | Status: AC | PRN

## 2023-05-21 MED ORDER — SODIUM CHLORIDE 0.9 % IV SOLN
12.5000 mg | Freq: Once | INTRAVENOUS | Status: AC
  Administered 2023-05-21: 12.5 mg via INTRAVENOUS
  Filled 2023-05-21: qty 12.5

## 2023-05-21 MED ORDER — SODIUM CHLORIDE 0.9 % IV BOLUS
1000.0000 mL | Freq: Once | INTRAVENOUS | Status: AC
  Administered 2023-05-21: 1000 mL via INTRAVENOUS

## 2023-05-21 NOTE — ED Triage Notes (Signed)
 ARrives from home. C/O abdominal pain this morning. Also vomiting, but that has been ongoing for months. VS wnl.  Arrives via [facility].

## 2023-05-21 NOTE — ED Provider Notes (Signed)
 [facility] Provider Note    Event Date/Time   First MD Initiated Contact with Patient 05/21/23 1450     (approximate)   History   Abdominal Pain   HPI  Lacresia Darwish is a 33 year old female presenting to the emergency department for evaluation abdominal pain and vomiting.  Reports the symptoms have been ongoing for several months.  Has a referral to GI, but it is not for a while and her symptoms were not improving leading her to present to the ER.  No acute change in symptoms today.  Is tolerating some p.o. intake.  Feels generally weak.     Physical Exam   Triage Vital Signs: ED Triage Vitals  Encounter Vitals Group     BP 05/21/23 1309 (!) 93/47     Systolic BP Percentile --      Diastolic BP Percentile --      Pulse Rate 05/21/23 1309 88     Resp 05/21/23 1309 18     Temp 05/21/23 1309 98 F (36.7 C)     Temp src --      SpO2 05/21/23 1309 100 %     Weight 05/21/23 1310 279 lb (126.6 kg)     Height 05/21/23 1310 5' 8 (1.727 m)     Head Circumference --      Peak Flow --      Pain Score 05/21/23 1309 9     Pain Loc --      Pain Education --      Exclude from Growth Chart --     Most recent vital signs: Vitals:   05/21/23 1517 05/21/23 1801  BP: (!) 101/59 (!) 104/57  Pulse:  83  Resp:  18  Temp:    SpO2:  100%     General: Awake, interactive  CV:  Regular rate, good peripheral perfusion.  Resp:  Unlabored respirations, lungs clear to auscultation Abd:  Nondistended, soft, mild generalized tenderness without rebound or guarding Neuro:  Symmetric facial movement, fluid speech   ED Results / Procedures / Treatments   Labs (all labs ordered are listed, but only abnormal results are displayed) Labs Reviewed  COMPREHENSIVE METABOLIC PANEL - Abnormal; Notable for the following components:      Result Value   AST 14 (*)    All other components within normal limits  CBC - Abnormal; Notable for the following components:    Hemoglobin 16.2 (*)    MCHC 36.2 (*)    All other components within normal limits  LIPASE, BLOOD     EKG EKG independently reviewed interpreted by myself (ER attending) demonstrates:    RADIOLOGY Imaging independently reviewed and interpreted by myself demonstrates:    PROCEDURES:  Critical Care performed: No  Procedures   MEDICATIONS ORDERED IN ED: Medications  HYDROcodone -acetaminophen  (NORCO/VICODIN) 5-325 MG per tablet 1 tablet (1 tablet Oral Given 05/21/23 1525)  metoCLOPramide  (REGLAN ) tablet 10 mg (10 mg Oral Given 05/21/23 1525)  promethazine  (PHENERGAN ) 12.5 mg in sodium chloride  0.9 % 50 mL IVPB (0 mg Intravenous Stopped 05/21/23 1820)  sodium chloride  0.9 % bolus 1,000 mL (0 mLs Intravenous Stopped 05/21/23 1849)     IMPRESSION / MDM / ASSESSMENT AND PLAN / ED COURSE  I reviewed the triage vital signs and the nursing notes.  Differential diagnosis includes, but is not limited to, gastroparesis, viral illness, low suspicion for significant intra-abdominal process given chronic nature without acute change and reassuring abdominal exam  Patient's presentation is  most consistent with acute illness / injury with system symptoms.  33 year old female presenting to the Emergency Department for evaluation of several month history of vomiting.  Mildly low blood pressure on presentation, improved on reevaluation.  Labs without critical derangement.  Patient reports concern for gastroparesis as an outpatient, was trialed on Reglan  but had recurrent vomiting with this.  She was given IV fluids and Phenergan  and did report improvement with this.  She is comfortable with discharge with antiemetics.  Did not provide a urine sample here, but no urinary complaints and multiple negative UPT tests in system during time patient has been experiencing symptoms.  Strict return precautions provided.  Patient discharged stable condition with prescriptions for Zofran  and Phenergan .     FINAL  CLINICAL IMPRESSION(S) / ED DIAGNOSES   Final diagnoses:  Nausea and vomiting, unspecified vomiting type     Rx / DC Orders   ED Discharge Orders          Ordered    promethazine  (PHENERGAN ) 12.5 MG tablet  Every 6 hours PRN        05/21/23 1851    ondansetron  (ZOFRAN ) 4 MG tablet  Every 6 hours PRN        05/21/23 1851             Note:  This document was prepared using Dragon voice recognition software and may include unintentional dictation errors.   Levander Slate, MD 05/21/23 [phone]

## 2023-05-21 NOTE — ED Notes (Signed)
 Pt unable to tolerate po, pt vomited

## 2023-05-21 NOTE — Discharge Instructions (Signed)
 You are seen in the ER today for your ongoing vomiting.  Your testing was fortunately overall reassuring.  Please keep your scheduled follow-up with your GI doctor and continue to follow-up with your primary care doctor.  I sent a prescription for 2 nausea medicines to your pharmacy.  The Phenergan  does make you drowsy, do not drive or operate machinery when taking this.  Return to the ER for new or worsening symptoms.

## 2023-05-21 NOTE — ED Notes (Signed)
 Gave pt po fluids per Dr Hassie Bruce

## 2023-05-21 NOTE — ED Triage Notes (Signed)
 Pt comes with c/o belly pain for awhile now. Pt states she went to doc about it and they think it might be gastroparesis. Pt has referral but doesn't see them until later. Pt states pain is worse and vomiting and unable to keep anything down.

## 2023-05-22 ENCOUNTER — Telehealth: Payer: Self-pay

## 2023-05-22 NOTE — Transitions of Care (Post Inpatient/ED Visit) (Signed)
 05/22/2023  [name] MRN: [id number] DOB: 25-May-1990  Today's TOC FU Call Status: Today's TOC FU Call Status:: Successful TOC FU Call Completed Unsuccessful Call (1st Attempt) Date: 05/22/23 Patient's Name and Date of Birth confirmed.  Transition Care Management Follow-up Telephone Call Date of Discharge: 05/21/23 Discharge Facility: [facility]) Type of Discharge: Emergency Department Reason for ED Visit: Other: How have you been since you were released from the hospital?: Better Any questions or concerns?: Yes Patient Questions/Concerns:: Still having pain in stomach. Cannot get in to see GI doctor until April Patient Questions/Concerns Addressed: Provided Patient Educational Materials  Items Reviewed: Did you receive and understand the discharge instructions provided?: Yes Medications obtained,verified, and reconciled?: Yes (Medications Reviewed) Any new allergies since your discharge?: No Dietary orders reviewed?: No Do you have support at home?: Yes People in Home: spouse  Medications Reviewed Today: Medications Reviewed Today     Reviewed by Karry Barrilleaux N, CMA (Certified Medical Assistant) on 05/22/23 at 1148  Med List Status: <None>   Medication Order Taking? Sig Documenting Provider Last Dose Status Informant  albuterol  (VENTOLIN  HFA) 108 (90 Base) MCG/ACT inhaler [id number] Yes 2 puffs four times a day as needed  Patient taking differently: Inhale 2 puffs into the lungs every 8 (eight) hours as needed for wheezing or shortness of breath.   Brimage, Vondra, DO Taking Active Self, Pharmacy Records  ARIPiprazole  (ABILIFY ) 15 MG tablet [id number] Yes Take 0.5 tablets (7.5 mg total) by mouth daily for 2 days. Jacquetta Sharlot GRADE, NP Taking Active            Med Note ANICE, [facility] N   Tue Mar 28, 2023  5:14 PM) taking  cabergoline  (DOSTINEX ) 0.5 MG tablet [id number] Yes Take 1 tablet (0.5 mg total) by mouth once a week. Cam Charlie Loving, DO Taking Active Self, Pharmacy Records  Cyanocobalamin  (VITAMIN B-12 PO) [id number] Yes Take 1 capsule by mouth daily. [provider] Taking Active Self, Pharmacy Records  ipratropium (ATROVENT ) 0.06 % nasal spray [id number] Yes Place 2 sprays into both nostrils 4 (four) times daily. Bernardino Ditch, NP Taking Active   levothyroxine  (SYNTHROID ) 75 MCG tablet [id number] Yes Take 1 tablet (75 mcg total) by mouth daily before breakfast. Patient to call office and schedule appointment Shamleffer, Ibtehal Jaralla, MD Taking Active   lidocaine  (XYLOCAINE ) 2 % solution [id number] Yes Use as directed 15 mLs in the mouth or throat as needed for mouth pain. Moishe Chiquita HERO, NP Taking Active   Magnesium  500 MG CAPS [id number] Yes Take 500 mg by mouth at bedtime. [provider] Taking Active Self, Pharmacy Records  meloxicam  (MOBIC ) 15 MG tablet [id number] Yes Take 15 mg by mouth daily. [provider] Taking Active Self  omeprazole  (PRILOSEC) 20 MG capsule [id number] Yes TAKE 1 CAPSULE (20 MG TOTAL) BY MOUTH DAILY. Justus Leita DEL, MD Taking Active Pharmacy Records, Self  ondansetron  (ZOFRAN ) 4 MG tablet [id number] Yes Take 1 tablet (4 mg total) by mouth every 6 (six) hours. Bernardino Ditch, NP Taking Active   ondansetron  (ZOFRAN ) 4 MG tablet [id number] Yes Take 1 tablet (4 mg total) by mouth every 6 (six) hours as needed for up to 7 days for nausea or vomiting. Levander Slate, MD Taking Active   promethazine  (PHENERGAN ) 12.5 MG tablet [id number] Yes Take 1 tablet (12.5 mg total) by mouth every 6 (six) hours as needed for up to 5 days for nausea or vomiting. Levander Slate, MD Taking Active  sertraline (ZOLOFT) 50 MG tablet [id number] Yes Take 50 mg by mouth daily. [provider] Taking Active Self            Home Care and Equipment/Supplies: Were Home Health Services Ordered?: No Any new equipment or medical supplies ordered?: No  Functional Questionnaire: Do you need  assistance with bathing/showering or dressing?: No Do you need assistance with meal preparation?: No Do you need assistance with eating?: No Do you have difficulty maintaining continence: No Do you need assistance with getting out of bed/getting out of a chair/moving?: No Do you have difficulty managing or taking your medications?: No  Follow up appointments reviewed: PCP Follow-up appointment confirmed?: No MD Provider Line [phone] Given: Yes Specialist Hospital Follow-up appointment confirmed?: Yes Date of Specialist follow-up appointment?: 08/17/23 Follow-Up Specialty Provider:: Dr Aundria Do you need transportation to your follow-up appointment?: No Do you understand care options if your condition(s) worsen?: Yes-patient verbalized understanding    Ustin Cruickshank [place]  Primary Care & Sports Medicine at [facility] CMA, AAMA [address]  [place] [address] Office [phone]  Fax: [phone]

## 2023-05-22 NOTE — Transitions of Care (Post Inpatient/ED Visit) (Signed)
   05/22/2023  [name] MRN: [id number] DOB: 1991/02/09  Today's TOC FU Call Status: Today's TOC FU Call Status:: Unsuccessful Call (1st Attempt) Unsuccessful Call (1st Attempt) Date: 05/22/23  Attempted to reach the patient regarding the most recent Inpatient/ED visit.  Follow Up Plan: Additional outreach attempts will be made to reach the patient to complete the Transitions of Care (Post Inpatient/ED visit) call.   Harshil Cavallaro [place]  Primary Care & Sports Medicine at [facility] CMA, AAMA [address]  [place] [address] Office [phone]  Fax: [phone]

## 2023-06-09 ENCOUNTER — Other Ambulatory Visit: Payer: Self-pay | Admitting: Internal Medicine

## 2023-06-09 DIAGNOSIS — E039 Hypothyroidism, unspecified: Secondary | ICD-10-CM

## 2023-06-12 ENCOUNTER — Other Ambulatory Visit: Payer: Self-pay

## 2023-06-12 DIAGNOSIS — E039 Hypothyroidism, unspecified: Secondary | ICD-10-CM

## 2023-06-12 MED ORDER — LEVOTHYROXINE SODIUM 75 MCG PO TABS: 75.0000 ug | ORAL_TABLET | Freq: Every day | ORAL | 0 refills | Status: DC

## 2023-06-15 ENCOUNTER — Other Ambulatory Visit: Payer: Self-pay | Admitting: Obstetrics and Gynecology

## 2023-06-15 NOTE — Patient Instructions (Signed)
Hi Melanie Bell, I hope you feel better, please contact you provider today.  Have a good day!  Melanie Bell was given information about Medicaid Managed Care team care coordination services and verbally consented to engagement with the [facility] Managed Care team.   Melanie Bell - following are the goals we discussed in your visit today:   Goals Addressed    Timeframe:  Long-Range Goal Priority:  High Start Date:  08/22/22                           Expected End Date: ongoing                      Follow Up Date 06/29/23   - practice safe sex - schedule appointment for vaccines needed due to my age or health - schedule recommended health tests (blood work, mammogram, colonoscopy, pap test) - schedule and keep appointment for annual check-up    Why is this important?   Screening tests can find diseases early when they are easier to treat.  Your doctor or nurse will talk with you about which tests are important for you.  Getting shots for common diseases like the flu and shingles will help prevent them.  06/15/23:  Patient to check on another GI referral with PCP so she can be seen sooner than April-to f/u with GI today.  Has NEURO appt in Feb.  Patient verbalizes understanding of instructions and care plan provided today and agrees to view in MyChart. Active MyChart status and patient understanding of how to access instructions and care plan via MyChart confirmed with patient.     The Managed Medicaid care management team will reach out to the patient again over the next 45 business  days.  The  Patient  has been provided with contact information for the Managed Medicaid care management team and has been advised to call with any health related questions or concerns.   Following is a copy of your plan of care:  Care Plan : RN Care Manager Plan of Care  Updates made by Danie Chandler, RN since 06/15/2023 12:00 AM     Problem: Health Promotion or Disease Self-Management (General Plan of Care)       Long-Range Goal: Chronic Disease Management   Start Date: 08/22/2022  Expected End Date: 09/13/2023  Priority: High  Note:   Current Barriers:  Knowledge Deficits related to plan of care for management of migraines, asthma, GERD, PTSD, borderline personality disorder, schizoaffective disorder, chronic pain, hypothyroid, osteoarthritis  Chronic Disease Management support and education needs related to  Knowledge Deficits related to plan of care for management of migraines, asthma, GERD, PTSD, borderline personality disorder, schizoaffective disorder, chronic pain, hypothyroid, osteoarthritis 06/15/23:  Nausea and vomiting unchanged, also abdominal pain.  Seen in ED 1/5-has GI appt in April-has not followed up with PCP regarding another referral to be seen sooner-to call PCP today-taking Zofran as needed.  Has NEURO appt in Feb and GYN in March.  Continues ACT team.  Breathing WNL.  RNCM Clinical Goal(s):  Patient will verbalize understanding of plan for management of migraines, asthma, GERD, PTSD, borderline personality disorder, schizoaffective disorder, chronic pain, hypothyroid, osteoarthritis  verbalize basic understanding of  migraines, asthma, GERD, PTSD, borderline personality disorder, schizoaffective disorder, chronic pain, hypothyroid, osteoarthritis  take all medications exactly as prescribed and will call provider for medication related questions as evidenced by patient report demonstrate understanding of rationale for  each prescribed medication as evidenced by patient report attend all scheduled medical appointments as evidenced by patient report and EMR review demonstrate ongoing  adherence to prescribed treatment plan for migraines, asthma, GERD, PTSD, borderline personality disorder, schizoaffective disorder, chronic pain, hypothyroid, osteoarthritis  continue to work with RN Care Manager to address care management and care coordination needs related to migraines, asthma, GERD, PTSD,  borderline personality disorder, schizoaffective disorder, chronic pain, hypothyroid, osteoarthritis  experience decrease in ED visits as evidenced by EMR review.  ED visits in in last 6 months = 16 through collaboration with RN Care manager, provider, and care team.   Interventions: Inter-disciplinary care team collaboration (see longitudinal plan of care) Evaluation of current treatment plan related to  self management and patient's adherence to plan as established by provider  Asthma: (Status:New goal.) Long Term Goal Advised patient to track and manage Asthma triggers Advised patient to self assesses Asthma action plan zone and make appointment with provider if in the yellow zone for 48 hours without improvement Provided education about and advised patient to utilize infection prevention strategies to reduce risk of respiratory infection Discussed the importance of adequate rest and management of fatigue with Asthma Assessed social determinant of health barriers   Pain Interventions:  (Status:  New goal.) Long Term Goal Pain assessment performed Medications reviewed Reviewed provider established plan for pain management Discussed importance of adherence to all scheduled medical appointments Counseled on the importance of reporting any/all new or changed pain symptoms or management strategies to pain management provider Advised patient to report to care team affect of pain on daily activities Discussed use of relaxation techniques and/or diversional activities to assist with pain reduction (distraction, imagery, relaxation, massage, acupressure, TENS, heat, and cold application Reviewed with patient prescribed pharmacological and nonpharmacological pain relief strategies Assessed social determinant of health barriers  Patient Goals/Self-Care Activities: Take all medications as prescribed Attend all scheduled provider appointments Call pharmacy for medication refills 3-7 days in advance  of running out of medications Perform all self care activities independently  Perform IADL's (shopping, preparing meals, housekeeping, managing finances) independently Call provider office for new concerns or questions  05/15/23:  patient to f/u with provider regarding GI appt.  Follow Up Plan:  The patient has been provided with contact information for the care management team and has been advised to call with any health related questions or concerns.  The care management team will reach out to the patient again over the next 60 business  days.    Kathi Der RN, BSN, [employer] [facility] RN Care Manager Direct Dial [phone]/UJW [phone] Website: [email or web address]

## 2023-06-15 NOTE — Patient Outreach (Signed)
Medicaid Managed Care   Nurse Care Manager Note  06/15/2023 Name:  Melanie Bell MRN:  [id number] DOB:  1990-10-09  Melanie Bell is an 33 y.o. year old female who is a primary patient of Melanie Bell, Melanie Cowden, MD.  [facility] Managed Care Coordination team was consulted for assistance with:    Chronic healthcare management needs, asthma, osteoarthritis, migraines, pseudoseizures, GERD, PTSD, depression, BP, personality disorder, schizoaffective disorder, hypothyroid, HLD  Melanie Bell was given information about Medicaid Managed Care Coordination team services today. [name] Patient agreed to services and verbal consent obtained.  Engaged with patient by telephone for follow up visit in response to provider referral for case management and/or care coordination services.   Patient is participating in a Managed Medicaid Plan:  No  Assessments/Interventions:  Review of past medical history, allergies, medications, health status, including review of consultants reports, laboratory and other test data, was performed as part of comprehensive evaluation and provision of chronic care management services.  SDOH (Social Drivers of Health) assessments and interventions performed: SDOH Interventions    Flowsheet Row Patient Outreach Telephone from 06/15/2023 in [place] [facility] POP[facility] DEPART[facility] Patient Outreach Telephone from 05/15/2023 in [place] [facility] POPULATION HEALTH DEPARTMENT Office Visit from 04/18/2023 in [facility] at [facility] Patient Outreach Telephone from 03/15/2023 in [place] POPULATION HEALTH DEPARTMENT Patient Outreach Telephone from 02/13/2023 in [place] POPULATION HEALTH DEPARTMENT Clinical Support from 01/11/2023 in [facility] at [facility]  SDOH Interventions        Food Insecurity Interventions -- -- -- -- -- Intervention Not Indicated  Housing Interventions -- -- -- -- --  Intervention Not Indicated  Transportation Interventions -- -- -- -- -- Intervention Not Indicated, Patient Resources (Friends/Family)  Utilities Interventions -- Intervention Not Indicated -- -- -- Intervention Not Indicated  Alcohol Usage Interventions -- Intervention Not Indicated (Score <7) -- -- -- Intervention Not Indicated (Score <7)  Depression Interventions/Treatment  -- -- Counseling -- -- PHQ2-9 Score <4 Follow-up Not Indicated  Financial Strain Interventions -- -- -- Intervention Not Indicated -- Intervention Not Indicated  Physical Activity Interventions -- -- -- -- -- Intervention Not Indicated  Stress Interventions -- -- -- Intervention Not Indicated -- Intervention Not Indicated  Social Connections Interventions -- -- -- -- Intervention Not Indicated Intervention Not Indicated  Health Literacy Interventions Intervention Not Indicated -- -- -- Intervention Not Indicated Intervention Not Indicated     Care Plan Allergies  Allergen Reactions   Betadine [Povidone Iodine] Hives   Fish Allergy Hives   Shellfish-Derived Products Hives    Medications Reviewed Today     Reviewed by Danie Chandler, RN (Registered Nurse) on 06/15/23 at [phone]  Med List Status: <None>   Medication Order Taking? Sig Documenting Provider Last Dose Status Informant  albuterol (VENTOLIN HFA) 108 (90 Base) MCG/ACT inhaler [id number] No 2 puffs four times a day as needed  Patient taking differently: Inhale 2 puffs into the lungs every 8 (eight) hours as needed for wheezing or shortness of breath.   [name], DO Taking Active Self, Pharmacy Records  ARIPiprazole (ABILIFY) 15 MG tablet [id number] No Take 0.5 tablets (7.5 mg total) by mouth daily for 2 days. Charm Rings, NP Taking Expired 05/22/23 2359            Med Note Jimmey Ralph, TIFFANY N   Tue Mar 28, 2023  5:14 PM) taking  cabergoline (DOSTINEX) 0.5 MG tablet  [id number] No Take 1 tablet (0.5 mg total) by mouth once a week. [name],  DO Taking Active Self, Pharmacy Records  Cyanocobalamin (VITAMIN B-12 PO) [id number] No Take 1 capsule by mouth daily. [provider] Taking Active Self, Pharmacy Records  ipratropium (ATROVENT) 0.06 % nasal spray [id number] No Place 2 sprays into both nostrils 4 (four) times daily. Becky Augusta, NP Taking Active   levothyroxine (SYNTHROID) 75 MCG tablet [id number]  Take 1 tablet (75 mcg total) by mouth daily before breakfast. Patient to call office and schedule appointment Shamleffer, Konrad Dolores, MD  Active   lidocaine (XYLOCAINE) 2 % solution [id number] No Use as directed 15 mLs in the mouth or throat as needed for mouth pain. Freddy Finner, NP Taking Active   Magnesium 500 MG CAPS [id number] No Take 500 mg by mouth at bedtime. [provider] Taking Active Self, Pharmacy Records  meloxicam (MOBIC) 15 MG tablet [id number] No Take 15 mg by mouth daily. [provider] Taking Active Self  omeprazole (PRILOSEC) 20 MG capsule [id number] No TAKE 1 CAPSULE (20 MG TOTAL) BY MOUTH DAILY. [name], MD Taking Active Pharmacy Records, Self  ondansetron [employer]) 4 MG tablet [id number] No Take 1 tablet (4 mg total) by mouth every 6 (six) hours. Becky Augusta, NP Taking Active   promethazine (PHENERGAN) 12.5 MG tablet [id number] No Take 1 tablet (12.5 mg total) by mouth every 6 (six) hours as needed for up to 5 days for nausea or vomiting. Trinna Post, MD Taking Expired 05/26/23 2359   sertraline (ZOLOFT) 50 MG tablet [id number] No Take 50 mg by mouth daily. [provider] Taking Active Self           Patient Active Problem List   Diagnosis Date Noted   Amenorrhea 04/18/2023   Oropharyngeal dysphagia 04/18/2023   Drug overdose, intentional self-harm, initial encounter (HCC) 01/24/2023   Insomnia 01/22/2023   Drug overdose, multiple drugs, intentional self-harm, initial encounter (HCC) 01/21/2023   Mild intermittent asthma 01/21/2023   Seizure (HCC) 01/16/2023    Major depressive disorder, recurrent episode, severe, without psychosis (HCC) 11/11/2022   Intentional overdose (HCC) 11/08/2022   Morbid obesity (HCC) 09/30/2021   Acquired hypothyroidism 06/25/2021   Migraine without aura and without status migrainosus, not intractable 03/05/2021   Mild intermittent asthma without complication 12/01/2020   Gastroesophageal reflux disease 12/01/2020   Hypoglycemia 12/01/2020   Rheumatoid factor positive 12/09/2019   SS-A antibody positive 10/28/2019   Chronic pain of both knees 10/28/2019   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   Schizoaffective disorder, depressive type (HCC) 02/02/2018   Suicide attempt (HCC) 01/16/2018   Borderline personality disorder (HCC) 01/04/2018   PTSD (post-traumatic stress disorder) 01/03/2018   ASCUS of cervix with negative high risk HPV 08/28/2017   Malingering 05/06/2016   Conditions to be addressed/monitored per PCP order:  Chronic healthcare management needs, asthma, osteoarthritis, migraines, pseudoseizures, GERD, PTSD, depression, BP, personality disorder, schizoaffective disorder, hypothyroid, HLD  Care Plan : RN Care Manager Plan of Care  Updates made by Danie Chandler, RN since 06/15/2023 12:00 AM     Problem: Health Promotion or Disease Self-Management (General Plan of Care)      Long-Range Goal: Chronic Disease Management   Start Date: 08/22/2022  Expected End Date: 09/13/2023  Priority: High  Note:   Current Barriers:  Knowledge Deficits related to plan of care for management of migraines, asthma, GERD, PTSD, borderline personality disorder, schizoaffective disorder, chronic pain, hypothyroid, osteoarthritis  Chronic Disease Management support  and education needs related to  Knowledge Deficits related to plan of care for management of migraines, asthma, GERD, PTSD, borderline personality disorder, schizoaffective disorder, chronic pain, hypothyroid, osteoarthritis 06/15/23:  Nausea and vomiting unchanged, also  abdominal pain.  Seen in ED 1/5-has GI appt in April-has not followed up with PCP regarding another referral to be seen sooner-to call PCP today-taking Zofran as needed.  Has NEURO appt in Feb and GYN in March.  Continues ACT team.  Breathing WNL.  RNCM Clinical Goal(s):  Patient will verbalize understanding of plan for management of migraines, asthma, GERD, PTSD, borderline personality disorder, schizoaffective disorder, chronic pain, hypothyroid, osteoarthritis  verbalize basic understanding of  migraines, asthma, GERD, PTSD, borderline personality disorder, schizoaffective disorder, chronic pain, hypothyroid, osteoarthritis  take all medications exactly as prescribed and will call provider for medication related questions as evidenced by patient report demonstrate understanding of rationale for each prescribed medication as evidenced by patient report attend all scheduled medical appointments as evidenced by patient report and EMR review demonstrate ongoing  adherence to prescribed treatment plan for migraines, asthma, GERD, PTSD, borderline personality disorder, schizoaffective disorder, chronic pain, hypothyroid, osteoarthritis  continue to work with RN Care Manager to address care management and care coordination needs related to migraines, asthma, GERD, PTSD, borderline personality disorder, schizoaffective disorder, chronic pain, hypothyroid, osteoarthritis  experience decrease in ED visits as evidenced by EMR review.  ED visits in in last 6 months = 16 through collaboration with RN Care manager, provider, and care team.   Interventions: Inter-disciplinary care team collaboration (see longitudinal plan of care) Evaluation of current treatment plan related to  self management and patient's adherence to plan as established by provider  Asthma: (Status:New goal.) Long Term Goal Advised patient to track and manage Asthma triggers Advised patient to self assesses Asthma action plan zone and make  appointment with provider if in the yellow zone for 48 hours without improvement Provided education about and advised patient to utilize infection prevention strategies to reduce risk of respiratory infection Discussed the importance of adequate rest and management of fatigue with Asthma Assessed social determinant of health barriers   Pain Interventions:  (Status:  New goal.) Long Term Goal Pain assessment performed Medications reviewed Reviewed provider established plan for pain management Discussed importance of adherence to all scheduled medical appointments Counseled on the importance of reporting any/all new or changed pain symptoms or management strategies to pain management provider Advised patient to report to care team affect of pain on daily activities Discussed use of relaxation techniques and/or diversional activities to assist with pain reduction (distraction, imagery, relaxation, massage, acupressure, TENS, heat, and cold application Reviewed with patient prescribed pharmacological and nonpharmacological pain relief strategies Assessed social determinant of health barriers  Patient Goals/Self-Care Activities: Take all medications as prescribed Attend all scheduled provider appointments Call pharmacy for medication refills 3-7 days in advance of running out of medications Perform all self care activities independently  Perform IADL's (shopping, preparing meals, housekeeping, managing finances) independently Call provider office for new concerns or questions  05/15/23:  patient to f/u with provider regarding GI appt.  Follow Up Plan:  The patient has been provided with contact information for the care management team and has been advised to call with any health related questions or concerns.  The care management team will reach out to the patient again over the next 60 business  days.    Long-Range Goal: Establish Plan of Care for Chronic Disease Management Needs   Priority:  High  Note:   Timeframe:  Long-Range Goal Priority:  High Start Date:  08/22/22                           Expected End Date: ongoing                      Follow Up Date 06/29/23   - practice safe sex - schedule appointment for vaccines needed due to my age or health - schedule recommended health tests (blood work, mammogram, colonoscopy, pap test) - schedule and keep appointment for annual check-up    Why is this important?   Screening tests can find diseases early when they are easier to treat.  Your doctor or nurse will talk with you about which tests are important for you.  Getting shots for common diseases like the flu and shingles will help prevent them.  06/15/23:  Patient to check on another GI referral with PCP so she can be seen sooner than April-to f/u with GI today.  Has NEURO appt in Feb.   Follow Up:  Patient agrees to Care Plan and Follow-up.  Plan: The Managed Medicaid care management team will reach out to the patient again over the next 45 business  days. and The  Patient has been provided with contact information for the Managed Medicaid care management team and has been advised to call with any health related questions or concerns.  Date/time of next scheduled RN care management/care coordination outreach: 07/27/23 at 1230

## 2023-06-17 ENCOUNTER — Ambulatory Visit
Admission: EM | Admit: 2023-06-17 | Discharge: 2023-06-17 | Disposition: A | Discharge status: Home / Self Care | Payer: Medicaid Other | Attending: Physician Assistant | Admitting: Physician Assistant

## 2023-06-17 DIAGNOSIS — J4521 Mild intermittent asthma with (acute) exacerbation: Secondary | ICD-10-CM

## 2023-06-17 DIAGNOSIS — J069 Acute upper respiratory infection, unspecified: Secondary | ICD-10-CM

## 2023-06-17 LAB — RESP PANEL BY RT-PCR (RSV, FLU A&B, COVID)  RVPGX2
Influenza A by PCR: NEGATIVE
Influenza B by PCR: NEGATIVE
Resp Syncytial Virus by PCR: NEGATIVE
SARS Coronavirus 2 by RT PCR: NEGATIVE

## 2023-06-17 MED ORDER — PREDNISONE 50 MG PO TABS
50.0000 mg | ORAL_TABLET | Freq: Every day | ORAL | 0 refills | Status: AC
Start: 2023-06-17 — End: 2023-06-22

## 2023-06-17 NOTE — Discharge Instructions (Signed)
Your covid and flu test are negative.Take prednisone as directed. Use albuterol and Atrovent as prescribed. Follow up with PCP next week, if you develop new or worsening issues (if you develop chest pain, palpitations, shortness of breath, go to the emergency room for further evaluation).

## 2023-06-17 NOTE — ED Triage Notes (Signed)
Pt is with her husband  Pt c/o cough, pain when breathing in, wheezing, temperature of 101.4 x3days

## 2023-06-17 NOTE — ED Provider Notes (Signed)
[facility]    CSN: [id number] Arrival date & time: 06/17/23  1026      History   Chief Complaint Chief Complaint  Patient presents with   Cough    HPI Melanie Bell is a 33 y.o. female.   33 year old female, Melanie Bell, presents to urgent care for evaluation of cough,wheezing and fever x 3 days. Unknown illness exposure.  PMH: asthma  The history is provided by the patient and the spouse. No language interpreter was used.    Past Medical History:  Diagnosis Date   Allergy    Anxiety    Arthritis    Asthma    Depression    Foreign body aspiration    GERD (gastroesophageal reflux disease)    Hallucinations 09/30/2014   Sizoaffective   Hematemesis 01/14/2022   Hyperlipidemia    Hypertension    Hypoglycemia    Intentional ingestion of batteries 04/21/2016    2 AAA batteries and 1 thumb tack; intent to hurt herself/notes 04/21/2016   Peritoneal adhesions without obstruction 02/16/2022   Seizure (HCC) 01/16/2023   Self-inflicted injury 11/23/2017   Suicidal ideation 01/02/2018   Tardive dyskinesia 10/2014   recent onset   Thyroid disease     Patient Active Problem List   Diagnosis Date Noted   Mild intermittent asthma with exacerbation 06/17/2023   Viral URI with cough 06/17/2023   Amenorrhea 04/18/2023   Oropharyngeal dysphagia 04/18/2023   Drug overdose, intentional self-harm, initial encounter (HCC) 01/24/2023   Insomnia 01/22/2023   Drug overdose, multiple drugs, intentional self-harm, initial encounter (HCC) 01/21/2023   Mild intermittent asthma 01/21/2023   Seizure (HCC) 01/16/2023   Major depressive disorder, recurrent episode, severe, without psychosis (HCC) 11/11/2022   Intentional overdose (HCC) 11/08/2022   Morbid obesity (HCC) 09/30/2021   Acquired hypothyroidism 06/25/2021   Migraine without aura and without status migrainosus, not intractable 03/05/2021   Mild intermittent asthma without complication 12/01/2020    Gastroesophageal reflux disease 12/01/2020   Hypoglycemia 12/01/2020   Rheumatoid factor positive 12/09/2019   SS-A antibody positive 10/28/2019   Chronic pain of both knees 10/28/2019   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   Schizoaffective disorder, depressive type (HCC) 02/02/2018   Suicide attempt (HCC) 01/16/2018   Borderline personality disorder (HCC) 01/04/2018   PTSD (post-traumatic stress disorder) 01/03/2018   ASCUS of cervix with negative high risk HPV 08/28/2017   Malingering 05/06/2016    Past Surgical History:  Procedure Laterality Date   ABDOMINAL SURGERY     "years ago" to remove foreign objects   APPENDECTOMY     BREAST EXCISIONAL BIOPSY Right 09/03/2007   surgical bx removed fibroadeoma   BREAST LUMPECTOMY Right    CHOLECYSTECTOMY  02/2022   COLONOSCOPY WITH PROPOFOL N/A 09/10/2015   Procedure: COLONOSCOPY WITH PROPOFOL;  Surgeon: Christena Deem, MD;  Location: [facility] ENDOSCOPY;  Service: Endoscopy;  Laterality: N/A;   ESOPHAGOGASTRODUODENOSCOPY N/A 11/28/2014   Procedure: ESOPHAGOGASTRODUODENOSCOPY (EGD);  Surgeon: Scot Jun, MD;  Location: [facility] ENDOSCOPY;  Service: Endoscopy;  Laterality: N/A;   ESOPHAGOGASTRODUODENOSCOPY N/A 02/21/2016   Procedure: ESOPHAGOGASTRODUODENOSCOPY (EGD);  Surgeon: Napoleon Form, MD;  Location: [facility] ENDOSCOPY;  Service: Endoscopy;  Laterality: N/A;   ESOPHAGOGASTRODUODENOSCOPY N/A 04/21/2016   Procedure: ESOPHAGOGASTRODUODENOSCOPY (EGD);  Surgeon: Iva Boop, MD;  Location: [facility] ENDOSCOPY;  Service: Endoscopy;  Laterality: N/A;   ESOPHAGOGASTRODUODENOSCOPY N/A 05/06/2016   Procedure: ESOPHAGOGASTRODUODENOSCOPY (EGD);  Surgeon: Wyline Mood, MD;  Location: [facility] ENDOSCOPY;  Service: Gastroenterology;  Laterality: N/A;   ESOPHAGOGASTRODUODENOSCOPY N/A  06/13/2016   Procedure: ESOPHAGOGASTRODUODENOSCOPY (EGD);  Surgeon: Midge Minium, MD;  Location: [facility] ENDOSCOPY;  Service: Endoscopy;  Laterality: N/A;   ESOPHAGOGASTRODUODENOSCOPY  N/A 01/14/2022   Procedure: ESOPHAGOGASTRODUODENOSCOPY (EGD);  Surgeon: Toledo, Boykin Nearing, MD;  Location: [facility] ENDOSCOPY;  Service: Gastroenterology;  Laterality: N/A;   ESOPHAGOGASTRODUODENOSCOPY (EGD) WITH PROPOFOL N/A 02/29/2016   Procedure: ESOPHAGOGASTRODUODENOSCOPY (EGD) WITH PROPOFOL;  Surgeon: Midge Minium, MD;  Location: [facility] ENDOSCOPY;  Service: Endoscopy;  Laterality: N/A;   ESOPHAGOGASTRODUODENOSCOPY (EGD) WITH PROPOFOL N/A 01/15/2018   Procedure: ESOPHAGOGASTRODUODENOSCOPY (EGD) WITH PROPOFOL;  Surgeon: Corbin Ade, MD;  Location: [facility] ENDO SUITE;  Service: Endoscopy;  Laterality: N/A;  retrieval forein body   LAPAROSCOPIC LYSIS OF ADHESIONS  02/16/2022   Procedure: LAPAROSCOPIC LYSIS OF ADHESIONS;  Surgeon: Henrene Dodge, MD;  Location: [facility] ORS;  Service: General;;   LAPAROTOMY N/A 09/12/2015   Procedure: EXPLORATORY LAPAROTOMY;  Surgeon: Lattie Haw, MD;  Location: [facility] ORS;  Service: General;  Laterality: N/A;   SIGMOIDOSCOPY N/A 09/12/2015   Procedure: Daiva Huge;  Surgeon: Lattie Haw, MD;  Location: [facility] ORS;  Service: General;  Laterality: N/A;   WISDOM TOOTH EXTRACTION      OB History     Gravida  5   Para  0   Term  0   Preterm  0   AB  3   Living  0      SAB  3   IAB  0   Ectopic  0   Multiple  0   Live Births               Home Medications    Prior to Admission medications   Medication Sig Start Date End Date Taking? Authorizing Provider  albuterol (VENTOLIN HFA) 108 (90 Base) MCG/ACT inhaler 2 puffs four times a day as needed Patient taking differently: Inhale 2 puffs into the lungs every 8 (eight) hours as needed for wheezing or shortness of breath. 12/14/22  Yes Brimage, Vondra, DO  cabergoline (DOSTINEX) 0.5 MG tablet Take 1 tablet (0.5 mg total) by mouth once a week. 11/14/22  Yes [name], DO  Cyanocobalamin (VITAMIN B-12 PO) Take 1 capsule by mouth daily.   Yes [provider]  ipratropium  (ATROVENT) 0.06 % nasal spray Place 2 sprays into both nostrils 4 (four) times daily. 03/28/23  Yes Becky Augusta, NP  levothyroxine (SYNTHROID) 75 MCG tablet Take 1 tablet (75 mcg total) by mouth daily before breakfast. Patient to call office and schedule appointment 06/12/23  Yes Shamleffer, Konrad Dolores, MD  lidocaine (XYLOCAINE) 2 % solution Use as directed 15 mLs in the mouth or throat as needed for mouth pain. 04/03/23  Yes Freddy Finner, NP  Magnesium 500 MG CAPS Take 500 mg by mouth at bedtime.   Yes [provider]  meloxicam (MOBIC) 15 MG tablet Take 15 mg by mouth daily.   Yes [provider]  omeprazole (PRILOSEC) 20 MG capsule TAKE 1 CAPSULE (20 MG TOTAL) BY MOUTH DAILY. 06/07/22  Yes [name], MD  ondansetron (ZOFRAN) 4 MG tablet Take 1 tablet (4 mg total) by mouth every 6 (six) hours. 03/28/23  Yes Becky Augusta, NP  predniSONE (DELTASONE) 50 MG tablet Take 1 tablet (50 mg total) by mouth daily with breakfast for 5 days. 06/17/23 06/22/23 Yes Meghanne Pletz, Para March, NP  sertraline (ZOLOFT) 50 MG tablet Take 50 mg by mouth daily.   Yes [provider]  ARIPiprazole (ABILIFY) 15 MG tablet Take 0.5 tablets (  7.5 mg total) by mouth daily for 2 days. 01/29/23 05/22/23  Charm Rings, NP  promethazine (PHENERGAN) 12.5 MG tablet Take 1 tablet (12.5 mg total) by mouth every 6 (six) hours as needed for up to 5 days for nausea or vomiting. 05/21/23 05/26/23  Trinna Post, MD    Family History Family History  Problem Relation Age of Onset   Depression Mother    Hypertension Mother    Sleep apnea Mother    Asthma Mother    COPD Mother    Diabetes Mother    Anxiety disorder Mother    Arthritis Mother    Heart failure Mother    Breast cancer Maternal Aunt        20's   Stroke Maternal Grandmother    Heart attack Maternal Grandfather     Social History Social History   Tobacco Use   Smoking status: Former    Current packs/day: 0.00    Average packs/day: 0.5  packs/day for 3.0 years (1.5 ttl pk-yrs)    Types: Cigarettes    Start date: 08/14/2013    Quit date: 08/14/2016    Years since quitting: 6.8    Passive exposure: Past   Smokeless tobacco: Never  Vaping Use   Vaping status: Never Used  Substance Use Topics   Alcohol use: No   Drug use: No     Allergies   Betadine [povidone iodine], Fish allergy, and Shellfish-derived products   Review of Systems Review of Systems  Constitutional:  Positive for fever.  Respiratory:  Positive for cough and wheezing.   All other systems reviewed and are negative.    Physical Exam Triage Vital Signs ED Triage Vitals  Encounter Vitals Group     BP 06/17/23 1206 (!) 135/95     Systolic BP Percentile --      Diastolic BP Percentile --      Pulse Rate 06/17/23 1206 67     Resp --      Temp 06/17/23 1206 98.9 F (37.2 C)     Temp Source 06/17/23 1206 Oral     SpO2 06/17/23 1206 98 %     Weight --      Height 06/17/23 1204 5\' 7"  (1.702 m)     Head Circumference --      Peak Flow --      Pain Score 06/17/23 1204 7     Pain Loc --      Pain Education --      Exclude from Growth Chart --    No data found.  Updated Vital Signs BP (!) 135/95 (BP Location: Left Arm)   Pulse 67   Temp 98.9 F (37.2 C) (Oral)   Ht 5\' 7"  (1.702 m)   LMP 04/17/2023   SpO2 98%   BMI 43.70 kg/m   Visual Acuity Right Eye Distance:   Left Eye Distance:   Bilateral Distance:    Right Eye Near:   Left Eye Near:    Bilateral Near:     Physical Exam Vitals and nursing note reviewed.  Constitutional:      General: She is not in acute distress.    Appearance: She is well-developed and well-groomed.  HENT:     Head: Normocephalic and atraumatic.  Eyes:     Conjunctiva/sclera: Conjunctivae normal.  Cardiovascular:     Rate and Rhythm: Normal rate and regular rhythm.     Heart sounds: No murmur heard. Pulmonary:     Effort: Pulmonary effort is  normal. No respiratory distress.     Breath sounds: Normal  breath sounds and air entry.  Abdominal:     Palpations: Abdomen is soft.     Tenderness: There is no abdominal tenderness.  Musculoskeletal:        General: No swelling.     Cervical back: Neck supple.  Skin:    General: Skin is warm and dry.     Capillary Refill: Capillary refill takes less than 2 seconds.  Neurological:     General: No focal deficit present.     Mental Status: She is alert and oriented to person, place, and time.     GCS: GCS eye subscore is 4. GCS verbal subscore is 5. GCS motor subscore is 6.     Cranial Nerves: No cranial nerve deficit.     Sensory: No sensory deficit.  Psychiatric:        Attention and Perception: Attention normal.        Mood and Affect: Mood normal.        Speech: Speech normal.        Behavior: Behavior is cooperative.      UC Treatments / Results  Labs (all labs ordered are listed, but only abnormal results are displayed) Labs Reviewed  RESP PANEL BY RT-PCR (RSV, FLU A&B, COVID)  [id number]    EKG   Radiology No results found.  Procedures Procedures (including critical care time)  Medications Ordered in UC Medications - No data to display  Initial Impression / Assessment and Plan / UC Course  I have reviewed the triage vital signs and the nursing notes.  Pertinent labs & imaging results that were available during my care of the patient were reviewed by me and considered in my medical decision making (see chart for details).    Discussed exam findings and plan of care with patient, strict go to ER precautions given.   Patient verbalized understanding to this provider.  Ddx: Mild intermittent asthma, Viral uri w cough Final Clinical Impressions(s) / UC Diagnoses   Final diagnoses:  Mild intermittent asthma with exacerbation  Viral URI with cough     Discharge Instructions      Your covid and flu test are negative.Take prednisone as directed. Use albuterol and Atrovent as prescribed. Follow up with PCP next week, if  you develop new or worsening issues (if you develop chest pain, palpitations, shortness of breath, go to the emergency room for further evaluation).     ED Prescriptions     Medication Sig Dispense Auth. Provider   predniSONE (DELTASONE) 50 MG tablet Take 1 tablet (50 mg total) by mouth daily with breakfast for 5 days. 5 tablet Muath Hallam, Para March, NP      PDMP not reviewed this encounter.   Clancy Gourd, NP 06/17/23 1313

## 2023-06-29 ENCOUNTER — Ambulatory Visit: Payer: Medicaid Other | Admitting: Obstetrics and Gynecology

## 2023-07-04 ENCOUNTER — Other Ambulatory Visit: Payer: Self-pay | Admitting: Internal Medicine

## 2023-07-04 DIAGNOSIS — E039 Hypothyroidism, unspecified: Secondary | ICD-10-CM

## 2023-07-05 ENCOUNTER — Encounter: Payer: Medicaid Other | Admitting: Internal Medicine

## 2023-07-17 ENCOUNTER — Ambulatory Visit: Payer: Medicaid Other | Admitting: Certified Nurse Midwife

## 2023-07-27 ENCOUNTER — Other Ambulatory Visit: Payer: Self-pay | Admitting: Obstetrics and Gynecology

## 2023-07-27 NOTE — Patient Instructions (Signed)
 Visit Information  Melanie Bell was given information about Medicaid Managed Care team care coordination services and verbally consented to engagement with the [facility] Managed Care team.   Melanie Bell - following are the goals we discussed in your visit today:   Timeframe:  Long-Range Goal Priority:  High Start Date:  08/22/22                           Expected End Date: ongoing                      Follow Up Date 09/11/23    - practice safe sex - schedule appointment for vaccines needed due to my age or health - schedule recommended health tests (blood work, mammogram, colonoscopy, pap test) - schedule and keep appointment for annual check-up    Why is this important?   Screening tests can find diseases early when they are easier to treat.  Your doctor or nurse will talk with you about which tests are important for you.  Getting shots for common diseases like the flu and shingles will help prevent them.  07/27/23:  Seen by Azzie Almas in February, to see GYN tomorrow and GI 4  Patient verbalizes understanding of instructions and care plan provided today and agrees to view in MyChart. Active MyChart status and patient understanding of how to access instructions and care plan via MyChart confirmed with patient.     The Managed Medicaid care management team will reach out to the patient again over the next 45 business  days.  The  Patient   has been provided with contact information for the Managed Medicaid care management team and has been advised to call with any health related questions or concerns.   Following is a copy of your plan of care:  Care Plan : RN Care Manager Plan of Care  Updates made by Danie Chandler, RN since 07/27/2023 12:00 AM     Problem: Health Promotion or Disease Self-Management (General Plan of Care)      Long-Range Goal: Chronic Disease Management   Start Date: 08/22/2022  Expected End Date: 09/13/2023  Priority: High  Note:   Current Barriers:  Knowledge Deficits  related to plan of care for management of migraines, asthma, GERD, PTSD, borderline personality disorder, schizoaffective disorder, chronic pain, hypothyroid, osteoarthritis  Chronic Disease Management support and education needs related to  Knowledge Deficits related to plan of care for management of migraines, asthma, GERD, PTSD, borderline personality disorder, schizoaffective disorder, chronic pain, hypothyroid, osteoarthritis 07/27/23: No change, N/V/abdominal pain continue-to see GI 4/3.  Zofran helps alleviate symptoms.  Zofran increased to 100mg  by provider due to increased anxiety and mood changes-followed by [facility].    RNCM Clinical Goal(s):  Patient will verbalize understanding of plan for management of migraines, asthma, GERD, PTSD, borderline personality disorder, schizoaffective disorder, chronic pain, hypothyroid, osteoarthritis  verbalize basic understanding of  migraines, asthma, GERD, PTSD, borderline personality disorder, schizoaffective disorder, chronic pain, hypothyroid, osteoarthritis  take all medications exactly as prescribed and will call provider for medication related questions as evidenced by patient report demonstrate understanding of rationale for each prescribed medication as evidenced by patient report attend all scheduled medical appointments as evidenced by patient report and EMR review demonstrate ongoing  adherence to prescribed treatment plan for migraines, asthma, GERD, PTSD, borderline personality disorder, schizoaffective disorder, chronic pain, hypothyroid, osteoarthritis  continue to work with RN Care Manager to address care management  and care coordination needs related to migraines, asthma, GERD, PTSD, borderline personality disorder, schizoaffective disorder, chronic pain, hypothyroid, osteoarthritis  experience decrease in ED visits as evidenced by EMR review.  ED visits in in last 6 months = 16 through collaboration with RN Care manager, provider, and care  team.   Interventions: Inter-disciplinary care team collaboration (see longitudinal plan of care) Evaluation of current treatment plan related to  self management and patient's adherence to plan as established by provider  Asthma: (Status:New goal.) Long Term Goal Advised patient to track and manage Asthma triggers Advised patient to self assesses Asthma action plan zone and make appointment with provider if in the yellow zone for 48 hours without improvement Provided education about and advised patient to utilize infection prevention strategies to reduce risk of respiratory infection Discussed the importance of adequate rest and management of fatigue with Asthma Assessed social determinant of health barriers   Pain Interventions:  (Status:  New goal.) Long Term Goal Pain assessment performed Medications reviewed Reviewed provider established plan for pain management Discussed importance of adherence to all scheduled medical appointments Counseled on the importance of reporting any/all new or changed pain symptoms or management strategies to pain management provider Advised patient to report to care team affect of pain on daily activities Discussed use of relaxation techniques and/or diversional activities to assist with pain reduction (distraction, imagery, relaxation, massage, acupressure, TENS, heat, and cold application Reviewed with patient prescribed pharmacological and nonpharmacological pain relief strategies Assessed social determinant of health barriers  Patient Goals/Self-Care Activities: Take all medications as prescribed Attend all scheduled provider appointments Call pharmacy for medication refills 3-7 days in advance of running out of medications Perform all self care activities independently  Perform IADL's (shopping, preparing meals, housekeeping, managing finances) independently Call provider office for new concerns or questions   Follow Up Plan:  The patient has  been provided with contact information for the care management team and has been advised to call with any health related questions or concerns.  The care management team will reach out to the patient again over the next 60 business  days.    Kathi Der RNC, BSN, [employer] [facility] RN Care Manager Direct Dial [phone] Website: [email or web address]

## 2023-07-27 NOTE — Patient Outreach (Signed)
 Medicaid Managed Care   Nurse Care Manager Note  07/27/2023 Name:  Melanie Bell MRN:  [id number] DOB:  [birth year]  Melanie Bell is an 33 y.o. year old female who is a primary patient of [name], [name], MD.  The [facility] Managed Care Coordination team was consulted for assistance with:    Chronic healthcare management needs, asthma, osteoarthritis, migraines, pseudoseizures, GERD, PTSD/depression/BP/personality disorder/anxiety/schizoaffective disorder, hypothyroid, HLD, fibromyalgia  Ms. Deskins was given information about Medicaid Managed Care Coordination team services today. Sylvie Farrier Archila Patient agreed to services and verbal consent obtained.  Engaged with patient by telephone for follow up visit in response to provider referral for case management and/or care coordination services.   Patient is participating in a Managed Medicaid Plan:  Yes  Assessments/Interventions:  Review of past medical history, allergies, medications, health status, including review of consultants reports, laboratory and other test data, was performed as part of comprehensive evaluation and provision of chronic care management services.  SDOH (Social Drivers of Health) assessments and interventions performed: SDOH Interventions    Flowsheet Row Patient Outreach Telephone from 07/27/2023 in [place] POPULATION HEALTH DEPARTMENT Patient Outreach Telephone from 06/15/2023 in [place] POPULATION HEALTH DEPARTMENT Patient Outreach Telephone from 05/15/2023 in [place] [facility] POPULATION HEALTH DEPARTMENT Office Visit from 04/18/2023 in [facility] at [facility] Patient Outreach Telephone from 03/15/2023 in [place] POPULATION HEALTH DEPARTMENT Patient Outreach Telephone from 02/13/2023 in  POPULATION HEALTH DEPARTMENT  SDOH Interventions        Housing Interventions Intervention Not Indicated -- -- -- -- --  Transportation Interventions Intervention Not  Indicated -- -- -- -- --  Utilities Interventions -- -- Intervention Not Indicated -- -- --  Alcohol Usage Interventions -- -- Intervention Not Indicated (Score <7) -- -- --  Depression Interventions/Treatment  -- -- -- Counseling -- --  Financial Strain Interventions -- -- -- -- Intervention Not Indicated --  Stress Interventions -- -- -- -- Intervention Not Indicated --  Social Connections Interventions -- -- -- -- -- Intervention Not Indicated  Health Literacy Interventions -- Intervention Not Indicated -- -- -- Intervention Not Indicated     Care Plan Allergies  Allergen Reactions   Betadine [Povidone Iodine] Hives   Fish Allergy Hives   Shellfish-Derived Products Hives   Medications Reviewed Today     Reviewed by Danie Chandler, RN (Registered Nurse) on 07/27/23 at 1142  Med List Status: <None>   Medication Order Taking? Sig Documenting Provider Last Dose Status Informant  albuterol (VENTOLIN HFA) 108 (90 Base) MCG/ACT inhaler [id number] No 2 puffs four times a day as needed  Patient taking differently: Inhale 2 puffs into the lungs every 8 (eight) hours as needed for wheezing or shortness of breath.   [name], DO 06/17/2023 Active Self, Pharmacy Records  ARIPiprazole (ABILIFY) 15 MG tablet [id number] No Take 0.5 tablets (7.5 mg total) by mouth daily for 2 days. Charm Rings, NP Taking Expired 05/22/23 2359            Med Note Jimmey Ralph, TIFFANY N   Tue Mar 28, 2023  5:14 PM) taking  cabergoline (DOSTINEX) 0.5 MG tablet [id number] No Take 1 tablet (0.5 mg total) by mouth once a week. [name], DO 06/17/2023 Active Self, Pharmacy Records  Cyanocobalamin (VITAMIN B-12 PO) [id number] No Take 1 capsule by mouth daily. [provider] 06/17/2023 Active Self, Pharmacy Records  ipratropium (ATROVENT) 0.06 % nasal spray [id number] No Place 2  sprays into both nostrils 4 (four) times daily. Becky Augusta, NP 06/17/2023 Active   levothyroxine (SYNTHROID) 75 MCG tablet  [id number]  TAKE ONE TABLET BY MOUTH ONCE EVERY DAY BEFORE BREAKFAST Shamleffer, Konrad Dolores, MD  Active   lidocaine (XYLOCAINE) 2 % solution [id number] No Use as directed 15 mLs in the mouth or throat as needed for mouth pain. Freddy Finner, NP 06/17/2023 Active   Magnesium 500 MG CAPS [id number] No Take 500 mg by mouth at bedtime. [provider] 06/17/2023 Active Self, Pharmacy Records  meloxicam (MOBIC) 15 MG tablet [id number] No Take 15 mg by mouth daily. [provider] 06/17/2023 Active Self  omeprazole (PRILOSEC) 20 MG capsule [id number] No TAKE 1 CAPSULE (20 MG TOTAL) BY MOUTH DAILY. [name], MD 06/17/2023 Active Pharmacy Records, Self  ondansetron [facility]) 4 MG tablet [id number] No Take 1 tablet (4 mg total) by mouth every 6 (six) hours. Becky Augusta, NP 06/17/2023 Active   promethazine (PHENERGAN) 12.5 MG tablet [id number] No Take 1 tablet (12.5 mg total) by mouth every 6 (six) hours as needed for up to 5 days for nausea or vomiting. Trinna Post, MD Taking Expired 05/26/23 2359   sertraline (ZOLOFT) 50 MG tablet [id number] No Take 100 mg by mouth daily. [provider] 06/17/2023 Active Self           Patient Active Problem List   Diagnosis Date Noted   Mild intermittent asthma with exacerbation 06/17/2023   Viral URI with cough 06/17/2023   Amenorrhea 04/18/2023   Oropharyngeal dysphagia 04/18/2023   Drug overdose, intentional self-harm, initial encounter (HCC) 01/24/2023   Insomnia 01/22/2023   Drug overdose, multiple drugs, intentional self-harm, initial encounter (HCC) 01/21/2023   Mild intermittent asthma 01/21/2023   Seizure (HCC) 01/16/2023   Major depressive disorder, recurrent episode, severe, without psychosis (HCC) 11/11/2022   Intentional overdose (HCC) 11/08/2022   Morbid obesity (HCC) 09/30/2021   Acquired hypothyroidism 06/25/2021   Migraine without aura and without status migrainosus, not intractable 03/05/2021   Mild intermittent asthma  without complication 12/01/2020   Gastroesophageal reflux disease 12/01/2020   Hypoglycemia 12/01/2020   Rheumatoid factor positive 12/09/2019   SS-A antibody positive 10/28/2019   Chronic pain of both knees 10/28/2019   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   Schizoaffective disorder, depressive type (HCC) 02/02/2018   Suicide attempt (HCC) 01/16/2018   Borderline personality disorder (HCC) 01/04/2018   PTSD (post-traumatic stress disorder) 01/03/2018   ASCUS of cervix with negative high risk HPV 08/28/2017   Malingering 05/06/2016   Conditions to be addressed/monitored per PCP order:  Chronic healthcare management needs, asthma, osteoarthritis, migraines, pseudoseizures, GERD, PTSD/depression/BP/personality disorder/anxiety/schizoaffective disorder, hypothyroid, HLD, fibromyalgia  Care Plan : RN Care Manager Plan of Care  Updates made by Danie Chandler, RN since 07/27/2023 12:00 AM     Problem: Health Promotion or Disease Self-Management (General Plan of Care)      Long-Range Goal: Chronic Disease Management   Start Date: 08/22/2022  Expected End Date: 09/13/2023  Priority: High  Note:   Current Barriers:  Knowledge Deficits related to plan of care for management of migraines, asthma, GERD, PTSD, borderline personality disorder, schizoaffective disorder, chronic pain, hypothyroid, osteoarthritis  Chronic Disease Management support and education needs related to  Knowledge Deficits related to plan of care for management of migraines, asthma, GERD, PTSD, borderline personality disorder, schizoaffective disorder, chronic pain, hypothyroid, osteoarthritis 07/27/23: No change, N/V/abdominal pain continue-to see GI 4/3.  Zofran helps alleviate symptoms.  Zofran increased to 100mg  by provider  due to increased anxiety and mood changes-followed by [facility].    RNCM Clinical Goal(s):  Patient will verbalize understanding of plan for management of migraines, asthma, GERD, PTSD, borderline personality  disorder, schizoaffective disorder, chronic pain, hypothyroid, osteoarthritis  verbalize basic understanding of  migraines, asthma, GERD, PTSD, borderline personality disorder, schizoaffective disorder, chronic pain, hypothyroid, osteoarthritis  take all medications exactly as prescribed and will call provider for medication related questions as evidenced by patient report demonstrate understanding of rationale for each prescribed medication as evidenced by patient report attend all scheduled medical appointments as evidenced by patient report and EMR review demonstrate ongoing  adherence to prescribed treatment plan for migraines, asthma, GERD, PTSD, borderline personality disorder, schizoaffective disorder, chronic pain, hypothyroid, osteoarthritis  continue to work with RN Care Manager to address care management and care coordination needs related to migraines, asthma, GERD, PTSD, borderline personality disorder, schizoaffective disorder, chronic pain, hypothyroid, osteoarthritis  experience decrease in ED visits as evidenced by EMR review.  ED visits in in last 6 months = 16 through collaboration with RN Care manager, provider, and care team.   Interventions: Inter-disciplinary care team collaboration (see longitudinal plan of care) Evaluation of current treatment plan related to  self management and patient's adherence to plan as established by provider  Asthma: (Status:New goal.) Long Term Goal Advised patient to track and manage Asthma triggers Advised patient to self assesses Asthma action plan zone and make appointment with provider if in the yellow zone for 48 hours without improvement Provided education about and advised patient to utilize infection prevention strategies to reduce risk of respiratory infection Discussed the importance of adequate rest and management of fatigue with Asthma Assessed social determinant of health barriers   Pain Interventions:  (Status:  New goal.) Long  Term Goal Pain assessment performed Medications reviewed Reviewed provider established plan for pain management Discussed importance of adherence to all scheduled medical appointments Counseled on the importance of reporting any/all new or changed pain symptoms or management strategies to pain management provider Advised patient to report to care team affect of pain on daily activities Discussed use of relaxation techniques and/or diversional activities to assist with pain reduction (distraction, imagery, relaxation, massage, acupressure, TENS, heat, and cold application Reviewed with patient prescribed pharmacological and nonpharmacological pain relief strategies Assessed social determinant of health barriers  Patient Goals/Self-Care Activities: Take all medications as prescribed Attend all scheduled provider appointments Call pharmacy for medication refills 3-7 days in advance of running out of medications Perform all self care activities independently  Perform IADL's (shopping, preparing meals, housekeeping, managing finances) independently Call provider office for new concerns or questions   Follow Up Plan:  The patient has been provided with contact information for the care management team and has been advised to call with any health related questions or concerns.  The care management team will reach out to the patient again over the next 60 business  days.    Long-Range Goal: Establish Plan of Care for Chronic Disease Management Needs   Priority: High  Note:   Timeframe:  Long-Range Goal Priority:  High Start Date:  08/22/22                           Expected End Date: ongoing                      Follow Up Date 09/11/23    - practice safe sex - schedule appointment for  vaccines needed due to my age or health - schedule recommended health tests (blood work, mammogram, colonoscopy, pap test) - schedule and keep appointment for annual check-up    Why is this important?    Screening tests can find diseases early when they are easier to treat.  Your doctor or nurse will talk with you about which tests are important for you.  Getting shots for common diseases like the flu and shingles will help prevent them.  07/27/23:  Seen by NEURO in February, to see GYN tomorrow and GI 4/3.   Follow Up:  Patient agrees to Care Plan and Follow-up.  Plan: The Managed Medicaid care management team will reach out to the patient again over the next 45 business  days. and The  Patient has been provided with contact information for the Managed Medicaid care management team and has been advised to call with any health related questions or concerns.  Date/time of next scheduled RN care management/care coordination outreach: 09/11/23 at 1

## 2023-07-28 ENCOUNTER — Encounter: Payer: Self-pay | Admitting: Certified Nurse Midwife

## 2023-07-28 ENCOUNTER — Ambulatory Visit (INDEPENDENT_AMBULATORY_CARE_PROVIDER_SITE_OTHER): Payer: Medicaid Other | Admitting: Certified Nurse Midwife

## 2023-07-28 VITALS — BP 131/86 | HR 83 | Ht 67.0 in | Wt 278.4 lb

## 2023-07-28 DIAGNOSIS — Z319 Encounter for procreative management, unspecified: Secondary | ICD-10-CM

## 2023-07-28 DIAGNOSIS — Z0000 Encounter for general adult medical examination without abnormal findings: Secondary | ICD-10-CM

## 2023-07-28 DIAGNOSIS — Z01419 Encounter for gynecological examination (general) (routine) without abnormal findings: Secondary | ICD-10-CM

## 2023-07-28 NOTE — Progress Notes (Addendum)
 GYNECOLOGY ANNUAL PREVENTATIVE CARE ENCOUNTER NOTE  History:     Melanie Bell is a 33 y.o. [id number] female here for a routine annual gynecologic exam.  Current complaints: infertility, pt state she has been trying to get pregnant for the pas 2-3 years. She has not use a ovulation kit. Her partner has not had a semen analysis.    Denies abnormal vaginal bleeding, discharge, pelvic pain, problems with intercourse or other gynecologic concerns.     Social Relationship:Married Living:spouse Work:homemaker Exercise:5x a week for 30 minutes  Smoke/Alcohol/drug use:Former Smoker quit 2018, No alcohol use/no history of drug use  Gynecologic History Patient's last menstrual period was 07/02/2023 (exact date). Contraception: none Last Pap: 08/21/2019. Results were: normal with negative HPV   Obstetric History OB History  Gravida Para Term Preterm AB Living  5 0 0 0 3 0  SAB IAB Ectopic Multiple Live Births  3 0 0 0     # Outcome Date GA Lbr Len/2nd Weight Sex Type Anes PTL Lv  5 Gravida           4 SAB 2021          3 SAB 2021          2 SAB 2021          1 Gravida             Past Medical History:  Diagnosis Date   Allergy    Anxiety    Arthritis    Asthma    Depression    Foreign body aspiration    GERD (gastroesophageal reflux disease)    Hallucinations 09/30/2014   Sizoaffective   Hematemesis 01/14/2022   Hyperlipidemia    Hypertension    Hypoglycemia    Intentional ingestion of batteries 04/21/2016    2 AAA batteries and 1 thumb tack; intent to hurt herself/notes 04/21/2016   Peritoneal adhesions without obstruction 02/16/2022   Seizure (HCC) 01/16/2023   Self-inflicted injury 11/23/2017   Suicidal ideation 01/02/2018   Tardive dyskinesia 10/2014   recent onset   Thyroid disease     Past Surgical History:  Procedure Laterality Date   ABDOMINAL SURGERY     "years ago" to remove foreign objects   APPENDECTOMY     BREAST EXCISIONAL BIOPSY Right  09/03/2007   surgical bx removed fibroadeoma   BREAST LUMPECTOMY Right    CHOLECYSTECTOMY  02/2022   COLONOSCOPY WITH PROPOFOL N/A 09/10/2015   Procedure: COLONOSCOPY WITH PROPOFOL;  Surgeon: Christena Deem, MD;  Location: [facility] ENDOSCOPY;  Service: Endoscopy;  Laterality: N/A;   ESOPHAGOGASTRODUODENOSCOPY N/A 11/28/2014   Procedure: ESOPHAGOGASTRODUODENOSCOPY (EGD);  Surgeon: Scot Jun, MD;  Location: [facility] ENDOSCOPY;  Service: Endoscopy;  Laterality: N/A;   ESOPHAGOGASTRODUODENOSCOPY N/A 02/21/2016   Procedure: ESOPHAGOGASTRODUODENOSCOPY (EGD);  Surgeon: Napoleon Form, MD;  Location: [facility] ENDOSCOPY;  Service: Endoscopy;  Laterality: N/A;   ESOPHAGOGASTRODUODENOSCOPY N/A 04/21/2016   Procedure: ESOPHAGOGASTRODUODENOSCOPY (EGD);  Surgeon: Iva Boop, MD;  Location[facility] ENDOSCOPY;  Service: Endoscopy;  Laterality: N/A;   ESOPHAGOGASTRODUODENOSCOPY N/A 05/06/2016   Procedure: ESOPHAGOGASTRODUODENOSCOPY (EGD);  Surgeon: Wyline Mood, MD;  Location: [facility] ENDOSCOPY;  Service: Gastroenterology;  Laterality: N/A;   ESOPHAGOGASTRODUODENOSCOPY N/A 06/13/2016   Procedure: ESOPHAGOGASTRODUODENOSCOPY (EGD);  Surgeon: Midge Minium, MD;  Location: [facility] ENDOSCOPY;  Service: Endoscopy;  Laterality: N/A;   ESOPHAGOGASTRODUODENOSCOPY N/A 01/14/2022   Procedure: ESOPHAGOGASTRODUODENOSCOPY (EGD);  Surgeon: Toledo, Boykin Nearing, MD;  Location: [facility] ENDOSCOPY;  Service: Gastroenterology;  Laterality: N/A;  ESOPHAGOGASTRODUODENOSCOPY (EGD) WITH PROPOFOL N/A 02/29/2016   Procedure: ESOPHAGOGASTRODUODENOSCOPY (EGD) WITH PROPOFOL;  Surgeon: Midge Minium, MD;  Location: [facility] ENDOSCOPY;  Service: Endoscopy;  Laterality: N/A;   ESOPHAGOGASTRODUODENOSCOPY (EGD) WITH PROPOFOL N/A 01/15/2018   Procedure: ESOPHAGOGASTRODUODENOSCOPY (EGD) WITH PROPOFOL;  Surgeon: Corbin Ade, MD;  Location: [facility] ENDO SUITE;  Service: Endoscopy;  Laterality: N/A;  retrieval forein body   LAPAROSCOPIC LYSIS OF ADHESIONS  02/16/2022    Procedure: LAPAROSCOPIC LYSIS OF ADHESIONS;  Surgeon: Henrene Dodge, MD;  Location: [facility] ORS;  Service: General;;   LAPAROTOMY N/A 09/12/2015   Procedure: EXPLORATORY LAPAROTOMY;  Surgeon: Lattie Haw, MD;  Location: [facility] ORS;  Service: General;  Laterality: N/A;   SIGMOIDOSCOPY N/A 09/12/2015   Procedure: Daiva Huge;  Surgeon: Lattie Haw, MD;  Location: [facility] ORS;  Service: General;  Laterality: N/A;   WISDOM TOOTH EXTRACTION      Current Outpatient Medications on File Prior to Visit  Medication Sig Dispense Refill   albuterol (VENTOLIN HFA) 108 (90 Base) MCG/ACT inhaler 2 puffs four times a day as needed (Patient taking differently: Inhale 2 puffs into the lungs every 8 (eight) hours as needed for wheezing or shortness of breath.) 18 g 1   ARIPiprazole (ABILIFY) 10 MG tablet Take 10 mg by mouth daily.     cabergoline (DOSTINEX) 0.5 MG tablet Take 1 tablet (0.5 mg total) by mouth once a week. 12 tablet 3   Cyanocobalamin (VITAMIN B-12 PO) Take 1 capsule by mouth daily.     ipratropium (ATROVENT) 0.06 % nasal spray Place 2 sprays into both nostrils 4 (four) times daily. 15 mL 12   levothyroxine (SYNTHROID) 75 MCG tablet TAKE ONE TABLET BY MOUTH ONCE EVERY DAY BEFORE BREAKFAST 28 tablet 10   Magnesium 500 MG CAPS Take 500 mg by mouth at bedtime.     meloxicam (MOBIC) 15 MG tablet Take 15 mg by mouth daily.     omeprazole (PRILOSEC) 20 MG capsule TAKE 1 CAPSULE (20 MG TOTAL) BY MOUTH DAILY. 90 capsule 2   ondansetron (ZOFRAN) 4 MG tablet Take 1 tablet (4 mg total) by mouth every 6 (six) hours. 12 tablet 0   sertraline (ZOLOFT) 100 MG tablet Take 100 mg by mouth daily.     sertraline (ZOLOFT) 50 MG tablet Take 100 mg by mouth daily.     No current facility-administered medications on file prior to visit.    Allergies  Allergen Reactions   Betadine [Povidone Iodine] Hives   Fish Allergy Hives   Shellfish-Derived Products Hives    Social History:  reports that she quit  smoking about 6 years ago. Her smoking use included cigarettes. She started smoking about 9 years ago. She has a 1.5 pack-year smoking history. She has been exposed to tobacco smoke. She has never used smokeless tobacco. She reports that she does not drink alcohol and does not use drugs.  Family History  Problem Relation Age of Onset   Depression Mother    Hypertension Mother    Sleep apnea Mother    Asthma Mother    COPD Mother    Diabetes Mother    Anxiety disorder Mother    Arthritis Mother    Heart failure Mother    Breast cancer Maternal Aunt        20's   Stroke Maternal Grandmother    Heart attack Maternal Grandfather     The following portions of the patient's history were reviewed and updated as appropriate: allergies, current medications, past family  history, past medical history, past social history, past surgical history and problem list.  Review of Systems Pertinent items noted in HPI and remainder of comprehensive ROS otherwise negative.  Physical Exam:  BP 131/86   Pulse 83   Ht 5\' 7"  (1.702 m)   Wt 278 lb 6.4 oz (126.3 kg)   LMP 07/02/2023 (Exact Date)   Breastfeeding No   BMI 43.60 kg/m  CONSTITUTIONAL: Well-developed, well-nourished, morbid obese,  female in no acute distress.  HENT:  Normocephalic, atraumatic, External right and left ear normal. Oropharynx is clear and moist EYES: Conjunctivae and EOM are normal. Pupils are equal, round, and reactive to light. No scleral icterus.  NECK: Normal range of motion, supple, no masses.  Normal thyroid.  SKIN: Skin is warm and dry. No rash noted. Not diaphoretic. No erythema. No pallor. MUSCULOSKELETAL: Normal range of motion. No tenderness.  No cyanosis, clubbing, or edema.  2+ distal pulses. NEUROLOGIC: Alert and oriented to person, place, and time. Normal reflexes, muscle tone coordination.  PSYCHIATRIC: Normal mood and affect. Normal behavior. Normal judgment and thought content. CARDIOVASCULAR: Normal heart  rate noted, regular rhythm RESPIRATORY: Clear to auscultation bilaterally. Effort and breath sounds normal, no problems with respiration noted. BREASTS: Symmetric in size. No masses, tenderness, skin changes, nipple drainage, or lymphadenopathy bilaterally.  ABDOMEN: Soft, no distention noted.  No tenderness, rebound or guarding.  PELVIC: Normal appearing external genitalia and urethral meatus; normal appearing vaginal mucosa and cervix.  No abnormal discharge noted.  Pap smear not due .   Normal uterine size, no other palpable masses, no uterine or adnexal tenderness.  .   Assessment and Plan:  Annual [facility] GYN Exam   Pap: due 2026 Mammogram :n/a  Labs:fsh/LH, estradiol, progesterone, prolactin,  TSH & Free T4, hemoglobin A1c, Lipid profile Refills: none Referral: none Discussed ovulation kit for timing of intercourse , and semen analysis. Encouraged weight loss to improve fertility. PT states she has regular cycles. 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 Pt encouraged to start with these things after use of ovulation kit for a few cycle and a negative semen analysis pt to follow up for infertility visit. I offered her a referral today but she would like to try ovulation meds first.  Routine preventative health maintenance measures emphasized. Please refer to After Visit Summary for other counseling recommendations.      Doreene Burke, CNM [facility]  [facility],  [facility]

## 2023-07-28 NOTE — Patient Instructions (Signed)

## 2023-07-29 ENCOUNTER — Encounter: Payer: Self-pay | Admitting: Certified Nurse Midwife

## 2023-07-29 LAB — FSH/LH
FSH: 2.7 m[IU]/mL
LH: 5.3 m[IU]/mL

## 2023-07-29 LAB — HEMOGLOBIN A1C
Est. average glucose Bld gHb Est-mCnc: 105 mg/dL
Hgb A1c MFr Bld: 5.3 % (ref 4.8–5.6)

## 2023-07-29 LAB — TSH+FREE T4
Free T4: 1.51 ng/dL (ref 0.82–1.77)
TSH: 0.77 u[IU]/mL (ref 0.450–4.500)

## 2023-07-29 LAB — LIPID PANEL
Chol/HDL Ratio: 3.5 ratio (ref 0.0–4.4)
Cholesterol, Total: 172 mg/dL (ref 100–199)
HDL: 49 mg/dL (ref >39)
LDL Chol Calc (NIH): 109 mg/dL — ABNORMAL HIGH (ref 0–99)
Triglycerides: 72 mg/dL (ref 0–149)
VLDL Cholesterol Cal: 14 mg/dL (ref 5–40)

## 2023-07-29 LAB — PROLACTIN: Prolactin: 5.8 ng/mL (ref 4.8–33.4)

## 2023-07-29 LAB — PROGESTERONE: Progesterone: 7.2 ng/mL

## 2023-07-29 LAB — ESTRADIOL: Estradiol: 95.8 pg/mL

## 2023-08-08 ENCOUNTER — Emergency Department

## 2023-08-08 ENCOUNTER — Other Ambulatory Visit: Payer: Self-pay

## 2023-08-08 ENCOUNTER — Emergency Department
Admission: EM | Admit: 2023-08-08 | Discharge: 2023-08-08 | Disposition: A | Discharge status: Home / Self Care | Attending: Emergency Medicine | Admitting: Emergency Medicine

## 2023-08-08 ENCOUNTER — Encounter: Payer: Self-pay | Admitting: *Deleted

## 2023-08-08 DIAGNOSIS — R0602 Shortness of breath: Secondary | ICD-10-CM | POA: Diagnosis present

## 2023-08-08 DIAGNOSIS — M545 Low back pain, unspecified: Secondary | ICD-10-CM

## 2023-08-08 DIAGNOSIS — I10 Essential (primary) hypertension: Secondary | ICD-10-CM | POA: Diagnosis not present

## 2023-08-08 DIAGNOSIS — W19XXXA Unspecified fall, initial encounter: Secondary | ICD-10-CM

## 2023-08-08 LAB — BASIC METABOLIC PANEL
Anion gap: 6 (ref 5–15)
BUN: 21 mg/dL — ABNORMAL HIGH (ref 6–20)
CO2: 27 mmol/L (ref 22–32)
Calcium: 9.7 mg/dL (ref 8.9–10.3)
Chloride: 106 mmol/L (ref 98–111)
Creatinine, Ser: 0.73 mg/dL (ref 0.44–1.00)
GFR, Estimated: >60 mL/min (ref >60)
Glucose, Bld: 105 mg/dL — ABNORMAL HIGH (ref 70–99)
Potassium: 4.2 mmol/L (ref 3.5–5.1)
Sodium: 139 mmol/L (ref 135–145)

## 2023-08-08 LAB — CBC
HCT: 43.1 % (ref 36.0–46.0)
Hemoglobin: 14.1 g/dL (ref 12.0–15.0)
MCH: 30.7 pg (ref 26.0–34.0)
MCHC: 32.7 g/dL (ref 30.0–36.0)
MCV: 93.7 fL (ref 80.0–100.0)
Platelets: 220 10*3/uL (ref 150–400)
RBC: 4.6 MIL/uL (ref 3.87–5.11)
RDW: 12.8 % (ref 11.5–15.5)
WBC: 7.8 10*3/uL (ref 4.0–10.5)
nRBC: 0 % (ref 0.0–0.2)

## 2023-08-08 LAB — TROPONIN I (HIGH SENSITIVITY): Troponin I (High Sensitivity): 3 ng/L (ref <18)

## 2023-08-08 NOTE — ED Provider Notes (Signed)
 [facility] Provider Note   Event Date/Time   First MD Initiated Contact with Patient 08/08/23 2208     (approximate) History  Shortness of Breath  HPI Melanie Bell is a 33 y.o. female with a past medical history of PTSD, borderline personality disorder, schizoaffective disorder, migraines, hypertension, hyperlipidemia who presents complaining of a fall 1 day prior to arrival with persistent right-sided lumbar back pain that radiates down the right leg as well as shortness of breath today that resolved after using her inhaler. ROS: Patient currently denies any vision changes, tinnitus, difficulty speaking, facial droop, sore throat, chest pain, abdominal pain, nausea/vomiting/diarrhea, dysuria, or weakness/numbness/paresthesias in any extremity   Physical Exam  Triage Vital Signs: ED Triage Vitals  Encounter Vitals Group     BP 08/08/23 2100 129/71     Systolic BP Percentile --      Diastolic BP Percentile --      Pulse Rate 08/08/23 2100 92     Resp 08/08/23 2100 18     Temp 08/08/23 2100 98.8 F (37.1 C)     Temp Source 08/08/23 2100 Oral     SpO2 08/08/23 2045 99 %     Weight 08/08/23 2057 279 lb (126.6 kg)     Height 08/08/23 2057 5\' 7"  (1.702 m)     Head Circumference --      Peak Flow --      Pain Score 08/08/23 2057 7     Pain Loc --      Pain Education --      Exclude from Growth Chart --    Most recent vital signs: Vitals:   08/08/23 2100 08/08/23 2223  BP: 129/71 134/88  Pulse: 92 80  Resp: 18 16  Temp: 98.8 F (37.1 C) 98.7 F (37.1 C)  SpO2: 100% 100%   General: Awake, oriented x4. CV:  Good peripheral perfusion.  Resp:  Normal effort.  CTAB Abd:  No distention.  Other:  Obese middle-aged Caucasian female resting comfortably in no acute distress ED Results / Procedures / Treatments  Labs (all labs ordered are listed, but only abnormal results are displayed) Labs Reviewed  BASIC METABOLIC PANEL - Abnormal; Notable for  the following components:      Result Value   Glucose, Bld 105 (*)    BUN 21 (*)    All other components within normal limits  CBC  TROPONIN I (HIGH SENSITIVITY)  TROPONIN I (HIGH SENSITIVITY)   EKG ED ECG REPORT I, Merwyn Katos, the attending physician, personally viewed and interpreted this ECG. Date: 08/08/2023 EKG Time: 2050 Rate: 88 Rhythm: normal sinus rhythm QRS Axis: normal Intervals: normal ST/T Wave abnormalities: normal Narrative Interpretation: no evidence of acute ischemia RADIOLOGY ED MD interpretation: One-view portable chest x-ray interpreted by me shows no evidence of acute abnormalities including no pneumonia, pneumothorax, or widened mediastinum -Agree with radiology assessment Official radiology report(s): DG Chest Port 1 View Result Date: 08/08/2023 CLINICAL DATA:  Chest pain. Seizures and syncopal episode at home. History of asthma, hypertension, former smoker. EXAM: PORTABLE CHEST 1 VIEW COMPARISON:  08/30/2022 FINDINGS: Shallow inspiration. Heart size and pulmonary vascularity are normal. Lungs are clear. No pleural effusion or pneumothorax. Mediastinal contours appear intact. Visualized ribs are nondisplaced. IMPRESSION: Shallow inspiration.  No evidence of active pulmonary disease. Electronically Signed   By: Burman Nieves M.D.   On: 08/08/2023 21:33   PROCEDURES: Critical Care performed: No Procedures MEDICATIONS ORDERED IN ED: Medications - No data  to display IMPRESSION / MDM / ASSESSMENT AND PLAN / ED COURSE  I reviewed the triage vital signs and the nursing notes.                             The patient is on the cardiac monitor to evaluate for evidence of arrhythmia and/or significant heart rate changes. Patient's presentation is most consistent with acute presentation with potential threat to life or bodily function. The patient was suffering from shortness of breath, but the immediate cause is not apparent.  Potential causes considered  include, but are not limited to, asthma or COPD, congestive heart failure, pulmonary embolism, pneumothorax, coronary syndrome, pneumonia, and pleural effusion.  Despite the evaluation including history, exam, and testing, the cause of the shortness of breath remains unclear. However, during the ED stay, patient's condition improved, and at the time of discharge the shortness of breath is resolved, they are feeling well, and want to go home. Patient's back pain likely secondary to trauma from fall yesterday Patient will be discharged with strict return precautions and advice to follow up with primary MD within 24 hours for further evaluation.   FINAL CLINICAL IMPRESSION(S) / ED DIAGNOSES   Final diagnoses:  SOB (shortness of breath)  Fall, initial encounter  Pain in right lumbar region of back   Rx / DC Orders   ED Discharge Orders     None      Note:  This document was prepared using Dragon voice recognition software and may include unintentional dictation errors.   Merwyn Katos, MD 08/08/23 2250

## 2023-08-08 NOTE — ED Triage Notes (Addendum)
 EMS  brings pt in from home; reports lying in bed, spouse reported 2 seizures (hx pseudoseizures), "passed out and he did compressions to ger her to come back around"; awoke with c/o [facility] so used her inhaler; also is c/o lower back pain because she "fell on a TV stand yesterday"

## 2023-08-08 NOTE — ED Triage Notes (Signed)
 Pt brought in via ems from home with sob and for a fall.  Pt reports sob this afternoon after 2 pseudo seizures.  Hx asthma.  Pt also reports she fell yesterday and has lower back pain .  Pt fell against the tv stand.  No loc.  Pt alert  speech clear.

## 2023-08-30 ENCOUNTER — Other Ambulatory Visit: Payer: Self-pay

## 2023-08-30 ENCOUNTER — Emergency Department
Admission: EM | Admit: 2023-08-30 | Discharge: 2023-08-31 | Disposition: A | Discharge status: Home / Self Care | Attending: Emergency Medicine | Admitting: Emergency Medicine

## 2023-08-30 DIAGNOSIS — R109 Unspecified abdominal pain: Secondary | ICD-10-CM | POA: Diagnosis not present

## 2023-08-30 DIAGNOSIS — E860 Dehydration: Secondary | ICD-10-CM | POA: Insufficient documentation

## 2023-08-30 DIAGNOSIS — R112 Nausea with vomiting, unspecified: Secondary | ICD-10-CM | POA: Insufficient documentation

## 2023-08-30 LAB — URINALYSIS, ROUTINE W REFLEX MICROSCOPIC
Bilirubin Urine: NEGATIVE
Glucose, UA: NEGATIVE mg/dL
Hgb urine dipstick: NEGATIVE
Ketones, ur: NEGATIVE mg/dL
Leukocytes,Ua: NEGATIVE
Nitrite: NEGATIVE
Protein, ur: NEGATIVE mg/dL
Specific Gravity, Urine: 1.012 (ref 1.005–1.030)
pH: 7 (ref 5.0–8.0)

## 2023-08-30 LAB — COMPREHENSIVE METABOLIC PANEL WITH GFR
ALT: 5 U/L (ref 0–44)
AST: 19 U/L (ref 15–41)
Albumin: 3.7 g/dL (ref 3.5–5.0)
Alkaline Phosphatase: 50 U/L (ref 38–126)
Anion gap: 8 (ref 5–15)
BUN: 12 mg/dL (ref 6–20)
CO2: 21 mmol/L — ABNORMAL LOW (ref 22–32)
Calcium: 9.3 mg/dL (ref 8.9–10.3)
Chloride: 109 mmol/L (ref 98–111)
Creatinine, Ser: 0.56 mg/dL (ref 0.44–1.00)
GFR, Estimated: >60 mL/min (ref >60)
Glucose, Bld: 93 mg/dL (ref 70–99)
Potassium: 4.4 mmol/L (ref 3.5–5.1)
Sodium: 138 mmol/L (ref 135–145)
Total Bilirubin: 1.2 mg/dL (ref 0.0–1.2)
Total Protein: 7 g/dL (ref 6.5–8.1)

## 2023-08-30 LAB — CBC
HCT: 41.1 % (ref 36.0–46.0)
Hemoglobin: 13.6 g/dL (ref 12.0–15.0)
MCH: 30.8 pg (ref 26.0–34.0)
MCHC: 33.1 g/dL (ref 30.0–36.0)
MCV: 93.2 fL (ref 80.0–100.0)
Platelets: 192 10*3/uL (ref 150–400)
RBC: 4.41 MIL/uL (ref 3.87–5.11)
RDW: 12.4 % (ref 11.5–15.5)
WBC: 10.2 10*3/uL (ref 4.0–10.5)
nRBC: 0 % (ref 0.0–0.2)

## 2023-08-30 LAB — POC URINE PREG, ED: Preg Test, Ur: NEGATIVE

## 2023-08-30 LAB — MAGNESIUM: Magnesium: 1.9 mg/dL (ref 1.7–2.4)

## 2023-08-30 LAB — LIPASE, BLOOD: Lipase: 32 U/L (ref 11–51)

## 2023-08-30 MED ORDER — DROPERIDOL 2.5 MG/ML IJ SOLN
2.5000 mg | Freq: Once | INTRAMUSCULAR | Status: AC
  Administered 2023-08-30: 2.5 mg via INTRAVENOUS
  Filled 2023-08-30: qty 2

## 2023-08-30 MED ORDER — SODIUM CHLORIDE 0.9 % IV BOLUS
1000.0000 mL | Freq: Once | INTRAVENOUS | Status: AC
  Administered 2023-08-30: 1000 mL via INTRAVENOUS

## 2023-08-30 MED ORDER — ONDANSETRON HCL 4 MG/2ML IJ SOLN
4.0000 mg | Freq: Once | INTRAMUSCULAR | Status: AC
  Administered 2023-08-30: 4 mg via INTRAVENOUS
  Filled 2023-08-30: qty 2

## 2023-08-30 MED ORDER — ONDANSETRON 4 MG PO TBDP: 4.0000 mg | ORAL_TABLET | Freq: Three times a day (TID) | ORAL | 0 refills | Status: DC | PRN

## 2023-08-30 NOTE — ED Notes (Signed)
 ED Provider at bedside.

## 2023-08-30 NOTE — ED Triage Notes (Addendum)
 Pt to ED via EMS from home, pt states she has felt dehydrated for the past few days and today developed n/v. Pt states she has some abd discomfort and bilateral leg cramps.

## 2023-08-30 NOTE — ED Provider Notes (Signed)
 [facility] Provider Note    Event Date/Time   First MD Initiated Contact with Patient 08/30/23 2318     (approximate)   History   Emesis   HPI  Melanie Bell is a 33 y.o. female   Past medical history of longstanding GI illness that is being followed by gastroenterology with a endoscopy scheduled for later this month per patient report, who presents to the emergency department with ongoing nausea and vomiting and thinking that she is dehydrated due to very poor p.o. intake over the last several days that she was visiting somebody in [place] and had avoided drinking the water there due to thoughts of contamination.    After getting home she started taking small sips but in her dehydrated state it exacerbated her pre-existing nausea and she has not been able to drink much.  She has mild cramping abdominal pain which is longstanding chronic and unchanged.  She has been making bowel movements, normally, and had 2 today.  She denies any urinary symptoms.  No fevers or chills.  No other acute medical complaints.   External Medical Documents Reviewed: Upper endoscopy by Dr. Norma Fredrickson in 2023 was normal      Physical Exam   Triage Vital Signs: ED Triage Vitals  Encounter Vitals Group     BP 08/30/23 2053 133/62     Systolic BP Percentile --      Diastolic BP Percentile --      Pulse Rate 08/30/23 2053 71     Resp 08/30/23 2053 16     Temp 08/30/23 2053 98.8 F (37.1 C)     Temp Source 08/30/23 2053 Oral     SpO2 08/30/23 2042 96 %     Weight 08/30/23 2052 289 lb (131.1 kg)     Height 08/30/23 2052 5\' 7"  (1.702 m)     Head Circumference --      Peak Flow --      Pain Score 08/30/23 2052 5     Pain Loc --      Pain Education --      Exclude from Growth Chart --     Most recent vital signs: Vitals:   08/30/23 2053 08/30/23 2340  BP: 133/62 (!) 112/59  Pulse: 71 67  Resp: 16 16  Temp: 98.8 F (37.1 C) 98.3 F (36.8 C)  SpO2: 99%  97%    General: Awake, no distress.  CV:  Good peripheral perfusion.  Resp:  Normal effort.  Abd:  No distention.  Other:  Looks slightly dehydrated with dry mucous membranes and poor skin turgor.  Abdomen is soft and nontender to palpation in all quadrants.   ED Results / Procedures / Treatments   Labs (all labs ordered are listed, but only abnormal results are displayed) Labs Reviewed  COMPREHENSIVE METABOLIC PANEL WITH GFR - Abnormal; Notable for the following components:      Result Value   CO2 21 (*)    All other components within normal limits  URINALYSIS, ROUTINE W REFLEX MICROSCOPIC - Abnormal; Notable for the following components:   Color, Urine STRAW (*)    APPearance CLEAR (*)    All other components within normal limits  LIPASE, BLOOD  CBC  MAGNESIUM  POC URINE PREG, ED     I ordered and reviewed the above labs they are notable for cell counts electrolytes and urinalysis unremarkable, not pregnant.  PROCEDURES:  Critical Care performed: No  Procedures   MEDICATIONS ORDERED  IN ED: Medications  sodium chloride 0.9 % bolus 1,000 mL (1,000 mLs Intravenous New Bag/Given 08/30/23 2343)  ondansetron (ZOFRAN) injection 4 mg (4 mg Intravenous Given 08/30/23 2343)  droperidol (INAPSINE) 2.5 MG/ML injection 2.5 mg (2.5 mg Intravenous Given 08/30/23 2357)    IMPRESSION / MDM / ASSESSMENT AND PLAN / ED COURSE  I reviewed the triage vital signs and the nursing notes.                                Patient's presentation is most consistent with acute presentation with potential threat to life or bodily function.  Differential diagnosis includes, but is not limited to, dehydration electrolyte derangements, considered but less likely obstruction or intra-abdominal infectious pathologies requiring surgery   The patient is on the cardiac monitor to evaluate for evidence of arrhythmia and/or significant heart rate changes.  MDM:    She has longstanding GI illness  that is unchanged but feels dehydrated and looks dehydrated due to poor p.o. intake after a vacation to an area where she avoided drinking the water.  Will IV hydrate and give antiemetics today.  Her benign abdominal exam rules against surgical abdominal pathology at this time, very low clinical suspicion avoid advanced imaging today.  Labs to check for electrolyte derangements or kidney dysfunction, pregnancy related complications, urine infection all negative.  She looks well enough to discharge today and will follow-up with PMD and GI as scheduled.       FINAL CLINICAL IMPRESSION(S) / ED DIAGNOSES   Final diagnoses:  Dehydration  Nausea and vomiting, unspecified vomiting type     Rx / DC Orders   ED Discharge Orders          Ordered    ondansetron (ZOFRAN-ODT) 4 MG disintegrating tablet  Every 8 hours PRN        08/30/23 2354             Note:  This document was prepared using Dragon voice recognition software and may include unintentional dictation errors.    Buell Carmin, MD 08/31/23 [phone]

## 2023-08-30 NOTE — Discharge Instructions (Addendum)
 Take Zofran as needed for nausea and vomiting. Drink plenty of fluids to stay well-hydrated.  Find Pedialyte or similar electrolyte rehydration formulas at your local pharmacy.  Follow-up with your doctor and follow-up with Dr. Corky Diener later this month for endoscopy as scheduled.  Thank you for choosing us  for your health care today!  Please see your primary doctor this week for a follow up appointment.   If you have any new, worsening, or unexpected symptoms call your doctor right away or come back to the emergency department for reevaluation.  It was my pleasure to care for you today.   Arron Large Margery Sheets, MD

## 2023-08-30 NOTE — ED Triage Notes (Signed)
 EMS brings pt in from home for c/o N/V since this am

## 2023-08-30 NOTE — ED Notes (Addendum)
Pt provided w/ warm blanket.

## 2023-08-31 ENCOUNTER — Encounter: Payer: Self-pay | Admitting: Internal Medicine

## 2023-09-11 ENCOUNTER — Ambulatory Visit: Payer: Self-pay

## 2023-09-12 ENCOUNTER — Encounter: Payer: Self-pay | Admitting: Internal Medicine

## 2023-09-12 ENCOUNTER — Ambulatory Visit: Admitting: Anesthesiology

## 2023-09-12 ENCOUNTER — Encounter
Admission: RE | Disposition: A | Discharge status: Home / Self Care | Payer: Self-pay | Source: Home / Self Care | Attending: Internal Medicine

## 2023-09-12 ENCOUNTER — Ambulatory Visit
Admission: RE | Admit: 2023-09-12 | Discharge: 2023-09-12 | Disposition: A | Discharge status: Home / Self Care | Attending: Internal Medicine | Admitting: Internal Medicine

## 2023-09-12 ENCOUNTER — Other Ambulatory Visit: Payer: Self-pay

## 2023-09-12 DIAGNOSIS — J45909 Unspecified asthma, uncomplicated: Secondary | ICD-10-CM | POA: Diagnosis not present

## 2023-09-12 DIAGNOSIS — R1033 Periumbilical pain: Secondary | ICD-10-CM | POA: Diagnosis present

## 2023-09-12 DIAGNOSIS — K219 Gastro-esophageal reflux disease without esophagitis: Secondary | ICD-10-CM | POA: Insufficient documentation

## 2023-09-12 DIAGNOSIS — F4310 Post-traumatic stress disorder, unspecified: Secondary | ICD-10-CM | POA: Diagnosis not present

## 2023-09-12 DIAGNOSIS — M1990 Unspecified osteoarthritis, unspecified site: Secondary | ICD-10-CM | POA: Insufficient documentation

## 2023-09-12 DIAGNOSIS — F32A Depression, unspecified: Secondary | ICD-10-CM | POA: Insufficient documentation

## 2023-09-12 DIAGNOSIS — Z79899 Other long term (current) drug therapy: Secondary | ICD-10-CM | POA: Diagnosis not present

## 2023-09-12 DIAGNOSIS — M069 Rheumatoid arthritis, unspecified: Secondary | ICD-10-CM | POA: Insufficient documentation

## 2023-09-12 DIAGNOSIS — Z6841 Body Mass Index (BMI) 40.0 and over, adult: Secondary | ICD-10-CM | POA: Diagnosis not present

## 2023-09-12 DIAGNOSIS — K2289 Other specified disease of esophagus: Secondary | ICD-10-CM | POA: Insufficient documentation

## 2023-09-12 DIAGNOSIS — Z87891 Personal history of nicotine dependence: Secondary | ICD-10-CM | POA: Diagnosis not present

## 2023-09-12 DIAGNOSIS — I10 Essential (primary) hypertension: Secondary | ICD-10-CM | POA: Diagnosis not present

## 2023-09-12 DIAGNOSIS — E66813 Obesity, class 3: Secondary | ICD-10-CM | POA: Insufficient documentation

## 2023-09-12 DIAGNOSIS — R1313 Dysphagia, pharyngeal phase: Secondary | ICD-10-CM | POA: Diagnosis present

## 2023-09-12 DIAGNOSIS — E039 Hypothyroidism, unspecified: Secondary | ICD-10-CM | POA: Diagnosis not present

## 2023-09-12 DIAGNOSIS — K2970 Gastritis, unspecified, without bleeding: Secondary | ICD-10-CM | POA: Diagnosis not present

## 2023-09-12 HISTORY — DX: Schizoaffective disorder, bipolar type: F25.0

## 2023-09-12 HISTORY — DX: Atypical squamous cells of undetermined significance on cytologic smear of cervix (ASC-US): R87.610

## 2023-09-12 HISTORY — PX: ESOPHAGOGASTRODUODENOSCOPY: SHX5428

## 2023-09-12 HISTORY — DX: Other specified abnormal immunological findings in serum: R76.8

## 2023-09-12 HISTORY — DX: Hypothyroidism, unspecified: E03.9

## 2023-09-12 HISTORY — DX: Hyperprolactinemia: E22.1

## 2023-09-12 HISTORY — DX: Dysphagia, pharyngoesophageal phase: R13.14

## 2023-09-12 HISTORY — DX: Post-traumatic stress disorder, unspecified: F43.10

## 2023-09-12 HISTORY — DX: Other specified abnormal immunological findings in serum: R76.89

## 2023-09-12 HISTORY — DX: Migraine without aura, not intractable, without status migrainosus: G43.009

## 2023-09-12 HISTORY — DX: Malingerer (conscious simulation): Z76.5

## 2023-09-12 LAB — POCT PREGNANCY, URINE
Preg Test, Ur: NEGATIVE
Preg Test, Ur: NEGATIVE

## 2023-09-12 SURGERY — EGD (ESOPHAGOGASTRODUODENOSCOPY)
Anesthesia: General

## 2023-09-12 MED ORDER — LIDOCAINE HCL (PF) 2 % IJ SOLN
INTRAMUSCULAR | Status: AC
  Filled 2023-09-12: qty 5

## 2023-09-12 MED ORDER — LIDOCAINE HCL (CARDIAC) PF 100 MG/5ML IV SOSY
PREFILLED_SYRINGE | INTRAVENOUS | Status: DC | PRN
  Administered 2023-09-12: 100 mg via INTRAVENOUS

## 2023-09-12 MED ORDER — PROPOFOL 10 MG/ML IV BOLUS
INTRAVENOUS | Status: DC | PRN
  Administered 2023-09-12: 50 mg via INTRAVENOUS
  Administered 2023-09-12: 100 mg via INTRAVENOUS

## 2023-09-12 MED ORDER — SODIUM CHLORIDE 0.9 % IV SOLN: INTRAVENOUS | Status: DC

## 2023-09-12 MED ORDER — PROPOFOL 500 MG/50ML IV EMUL
INTRAVENOUS | Status: DC | PRN
  Administered 2023-09-12: 100 ug/kg/min via INTRAVENOUS

## 2023-09-12 NOTE — Op Note (Signed)
 [facility] Patient [name] Procedure Date: 09/12/2023 10:33 AM MRN: [id number] Account #: [id number] Date of Birth: [birth year] Admit Type: Outpatient Age: 33 Room: [facility] ENDO ROOM 3 Gender: Female Note Status: Finalized Instrument Name: Upper Endoscope [id number] Procedure:             Upper GI endoscopy Indications:           Periumbilical abdominal pain, Pharyngeal phase                         dysphagia, Nausea with vomiting Providers:             Kelci Petrella K. Corky Diener MD, MD Referring MD:          Cristian Grieves K. Corky Diener MD, MD (Referring MD), Janna Melter, MD (Referring MD) Medicines:             Propofol  per Anesthesia Complications:         No immediate complications. Estimated blood loss:                         Minimal. Procedure:             Pre-Anesthesia Assessment:                        - The risks and benefits of the procedure and the                         sedation options and risks were discussed with the                         patient. All questions were answered and informed                         consent was obtained.                        - Patient identification and proposed procedure were                         verified prior to the procedure by the nurse. The                         procedure was verified in the procedure room.                        - ASA Grade Assessment: III - A patient with severe                         systemic disease.                        - After reviewing the risks and benefits, the patient                         was deemed in satisfactory condition to undergo the  procedure.                        After obtaining informed consent, the endoscope was                         passed under direct vision. Throughout the procedure,                         the patient's blood pressure, pulse, and oxygen                         saturations were  monitored continuously. The Endoscope                         was introduced through the mouth, and advanced to the                         third part of duodenum. The upper GI endoscopy was                         accomplished without difficulty. The patient tolerated                         the procedure well. Findings:      Two tongues of salmon-colored mucosa were present from 36 to 37 cm. No       other visible abnormalities were present. The maximum longitudinal       extent of these esophageal mucosal changes was 1 cm in length. Mucosa       was biopsied with a cold forceps for histology. One specimen bottle was       sent to pathology. Estimated blood loss was minimal.      There is no endoscopic evidence of stenosis, stricture, signs of       external compression or lower esophageal sphincter tone abnormalities in       the entire esophagus.      Patchy mild inflammation characterized by erosions and erythema was       found in the gastric antrum. Biopsies were taken with a cold forceps for       Helicobacter pylori testing.      The cardia and gastric fundus were normal on retroflexion.      The examined duodenum was normal. Impression:            - Salmon-colored mucosa suspicious for short-segment                         Barrett's esophagus. Biopsied.                        - Gastritis. Biopsied.                        - Normal examined duodenum. Recommendation:        - Patient has a contact number available for                         emergencies. The signs and symptoms of potential  delayed complications were discussed with the patient.                         Return to normal activities tomorrow. Written                         discharge instructions were provided to the patient.                        - Resume previous diet.                        - Continue present medications.                        - Await pathology results.                         - Return to my office in 4 months.                        - The findings and recommendations were discussed with                         the patient. Procedure Code(s):     --- Professional ---                        [phone], Esophagogastroduodenoscopy, flexible,                         transoral; with biopsy, single or multiple Diagnosis Code(s):     --- Professional ---                        R11.2, Nausea with vomiting, unspecified                        R13.13, Dysphagia, pharyngeal phase                        R10.33, Periumbilical pain                        K29.70, Gastritis, unspecified, without bleeding                        K22.89, Other specified disease of esophagus CPT copyright 2022 American [employer] Association. All rights reserved. The codes documented in this report are preliminary and upon coder review may  be revised to meet current compliance requirements. Cassie Click MD, MD 09/12/2023 11:00:28 AM This report has been signed electronically. Number of Addenda: 0 Note Initiated On: 09/12/2023 10:33 AM Estimated Blood Loss:  Estimated blood loss was minimal.      [facility]

## 2023-09-12 NOTE — Transfer of Care (Signed)
 Immediate Anesthesia Transfer of Care Note  Patient: Melanie Bell  Procedure(s) Performed: EGD (ESOPHAGOGASTRODUODENOSCOPY)  Patient Location: Endoscopy Unit  Anesthesia Type:General  Level of Consciousness: drowsy  Airway & Oxygen Therapy: Patient Spontanous Breathing  Post-op Assessment: Report given to RN and Post -op Vital signs reviewed and stable  Post vital signs: Reviewed and stable  Last Vitals:  Vitals Value Taken Time  BP 118/69 09/12/23 1100  Temp    Pulse 72 09/12/23 1100  Resp 33 09/12/23 1100  SpO2 95 % 09/12/23 1100  Vitals shown include unfiled device data.  Last Pain:  Vitals:   09/12/23 1033  TempSrc: Tympanic         Complications: No notable events documented.

## 2023-09-12 NOTE — H&P (Signed)
 Outpatient short stay form Pre-procedure 09/12/2023 9:25 AM Melanie Bell K. Corky Diener, M.D.  Primary Physician: Janna Melter, M.D.  Reason for visit:  Nausea and vomiting, Epigastric pain, GERD  History of present illness: Melanie Bell is a pleasant 33 year old female with a history of morbid obesity, GERD, PTSD, clinical depression and migraine headaches who presents with 106-month history of postprandial nausea with vomiting, abdominal pain. Patient reports her to numerous medications are Abilify  and Zoloft but that she has been having nausea and vomiting prior to those medications being started over 6 months ago. Patient denies any personal history of diabetes. She reports that her GERD symptoms which are composed of waterbrash and retrosternal and throat burning have been alleviated by omeprazole  20 mg daily. She continues, however, to have postprandial vomiting sometimes of food that she had ingested the previous day. She complains of pain in the epigastrium and periumbilical regions although it seems to be worse in the lower abdomen than the upper. Sometimes vomiting can improve her abdominal pain. She reports chronic constipation but this appears to be mostly controlled with Metamucil. She is status post cholecystectomy. Patient tells me that she had a previous upper endoscopy performed by me. I did look this up and we did perform upper endoscopy while the patient was admitted hospital on 01/14/2022. The endoscopy was documented as normal. Patient has tried taking Tums but omeprazole  appears to be working better. She does occasionally go without her omeprazole  she is having all of her medications controlled by her psychiatric office. Patient denies any hematemesis, involuntary weight loss, hematochezia or new change in bowel habits.     No current facility-administered medications for this encounter.  Medications Prior to Admission  Medication Sig Dispense Refill Last Dose/Taking   albuterol  (VENTOLIN   HFA) 108 (90 Base) MCG/ACT inhaler 2 puffs four times a day as needed (Patient taking differently: Inhale 2 puffs into the lungs every 8 (eight) hours as needed for wheezing or shortness of breath.) 18 g 1    ARIPiprazole  (ABILIFY ) 10 MG tablet Take 10 mg by mouth daily.      cabergoline  (DOSTINEX ) 0.5 MG tablet Take 1 tablet (0.5 mg total) by mouth once a week. 12 tablet 3    Cyanocobalamin  (VITAMIN B-12 PO) Take 1 capsule by mouth daily.      ipratropium (ATROVENT ) 0.06 % nasal spray Place 2 sprays into both nostrils 4 (four) times daily. 15 mL 12    levothyroxine  (SYNTHROID ) 75 MCG tablet TAKE ONE TABLET BY MOUTH ONCE EVERY DAY BEFORE BREAKFAST 28 tablet 10    Magnesium  500 MG CAPS Take 500 mg by mouth at bedtime.      meloxicam  (MOBIC ) 15 MG tablet Take 15 mg by mouth daily.      omeprazole  (PRILOSEC) 20 MG capsule TAKE 1 CAPSULE (20 MG TOTAL) BY MOUTH DAILY. 90 capsule 2    ondansetron  (ZOFRAN ) 4 MG tablet Take 1 tablet (4 mg total) by mouth every 6 (six) hours. 12 tablet 0    ondansetron  (ZOFRAN -ODT) 4 MG disintegrating tablet Take 1 tablet (4 mg total) by mouth every 8 (eight) hours as needed for nausea or vomiting. 20 tablet 0    sertraline (ZOLOFT) 100 MG tablet Take 100 mg by mouth daily.      sertraline (ZOLOFT) 50 MG tablet Take 100 mg by mouth daily.        Allergies  Allergen Reactions   Betadine [Povidone Iodine] Hives   Fish Allergy Hives   Shellfish-Derived Products Hives  Past Medical History:  Diagnosis Date   Acquired hypothyroidism    Allergy    Anxiety    Arthritis    ASCUS of cervix with negative high risk HPV    Asthma    Depression    Foreign body aspiration    GERD (gastroesophageal reflux disease)    Hallucinations 09/30/2014   Sizoaffective   Hematemesis 01/14/2022   Hyperlipidemia    Hyperprolactinemia (HCC)    Hypertension    Hypoglycemia    Intentional ingestion of batteries 04/21/2016    2 AAA batteries and 1 thumb tack; intent to hurt  herself/notes 04/21/2016   Malingering    Migraine without aura and without status migrainosus, not intractable    Peritoneal adhesions without obstruction 02/16/2022   Pharyngoesophageal dysphagia    PTSD (post-traumatic stress disorder)    Rheumatoid factor positive    Schizoaffective disorder, bipolar type (HCC)    Seizure (HCC) 01/16/2023   Self-inflicted injury 11/23/2017   SS-A antibody positive    Suicidal ideation 01/02/2018   Tardive dyskinesia 10/2014   recent onset   Thyroid  disease     Review of systems:  Otherwise negative.    Physical Exam  Gen: Alert, oriented. Appears stated age.  HEENT: [place]/AT. PERRLA. Lungs: CTA, no wheezes. CV: RR nl S1, S2. Abd: soft, benign, no masses. BS+ Ext: No edema. Pulses 2+    Planned procedures: Proceed with EGD. The patient understands the nature of the planned procedure, indications, risks, alternatives and potential complications including but not limited to bleeding, infection, perforation, damage to internal organs and possible oversedation/side effects from anesthesia. The patient agrees and gives consent to proceed.  Please refer to procedure notes for findings, recommendations and patient disposition/instructions.     Rayvn Rickerson K. Corky Diener, M.D. Gastroenterology 09/12/2023  9:25 AM
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
## 2023-09-12 NOTE — Interval H&P Note (Signed)
 History and Physical Interval Note:  09/12/2023 9:27 AM  Melanie Bell  has presented today for surgery, with the diagnosis of Gastroesophageal reflux disease, unspecified whether esophagitis present (K21.9) Pharyngoesophageal dysphagia (R13.14).  The various methods of treatment have been discussed with the patient and family. After consideration of risks, benefits and other options for treatment, the patient has consented to  Procedure(s): EGD (ESOPHAGOGASTRODUODENOSCOPY) (N/A) as a surgical intervention.  The patient's history has been reviewed, patient examined, no change in status, stable for surgery.  I have reviewed the patient's chart and labs.  Questions were answered to the patient's satisfaction.     [name], [name]

## 2023-09-12 NOTE — Anesthesia Postprocedure Evaluation (Signed)
 Anesthesia Post Note  Patient: Melanie Bell  Procedure(s) Performed: EGD (ESOPHAGOGASTRODUODENOSCOPY)  Patient location during evaluation: Endoscopy Anesthesia Type: General Level of consciousness: awake and alert Pain management: pain level controlled Vital Signs Assessment: post-procedure vital signs reviewed and stable Respiratory status: spontaneous breathing, nonlabored ventilation and respiratory function stable Cardiovascular status: blood pressure returned to baseline and stable Postop Assessment: no apparent nausea or vomiting Anesthetic complications: no   No notable events documented.   Last Vitals:  Vitals:   09/12/23 1033 09/12/23 1100  BP: 130/75 118/69  Pulse: 63 74  Resp: 20 (!) 33  Temp: (!) 35.9 C (!) 35.6 C  SpO2: 100% 95%    Last Pain:  Vitals:   09/12/23 1120  TempSrc:   PainSc: 0-No pain                 Baltazar Bonier

## 2023-09-13 LAB — SURGICAL PATHOLOGY

## 2023-10-24 ENCOUNTER — Ambulatory Visit: Admitting: Obstetrics and Gynecology

## 2023-10-27 ENCOUNTER — Other Ambulatory Visit: Payer: Self-pay | Admitting: Internal Medicine

## 2023-10-30 ENCOUNTER — Ambulatory Visit
Admission: EM | Admit: 2023-10-30 | Discharge: 2023-10-30 | Disposition: A | Discharge status: Home / Self Care | Attending: Emergency Medicine | Admitting: Emergency Medicine

## 2023-10-30 DIAGNOSIS — S0101XA Laceration without foreign body of scalp, initial encounter: Secondary | ICD-10-CM

## 2023-10-30 DIAGNOSIS — S060X0A Concussion without loss of consciousness, initial encounter: Secondary | ICD-10-CM | POA: Diagnosis not present

## 2023-10-30 DIAGNOSIS — S0990XA Unspecified injury of head, initial encounter: Secondary | ICD-10-CM

## 2023-10-30 MED ORDER — ONDANSETRON 8 MG PO TBDP: ORAL_TABLET | ORAL | 0 refills | Status: DC

## 2023-10-30 MED ORDER — ONDANSETRON 8 MG PO TBDP
8.0000 mg | ORAL_TABLET | Freq: Once | ORAL | Status: AC
  Administered 2023-10-30: 8 mg via ORAL
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
## 2023-10-30 NOTE — ED Triage Notes (Signed)
 Patient presents to UC for head injury x 2 days ago. States she tripped and fell in room. Reports continued HA, n/v. Cleaning laceration site with soap and water .

## 2023-10-30 NOTE — Discharge Instructions (Addendum)
 Continue keeping the laceration clean with soap and water .  Tylenol  1000 mg 3-4 times a day as needed for pain.  You may add 400 mg of ibuprofen  to the Tylenol  72 hours after injury.  Zofran  3 times a day.  This may cause constipation.  Omega-3 supplements can speed up your recovery.  Dr. Augustus Ledger will see you in a week after the injury-so in about 5 days.  Call his office today to set that up.

## 2023-11-03 ENCOUNTER — Encounter: Payer: Self-pay | Admitting: Family Medicine

## 2023-11-03 ENCOUNTER — Ambulatory Visit: Admitting: Family Medicine

## 2023-11-03 VITALS — BP 124/68 | HR 68 | Ht 67.0 in | Wt 282.4 lb

## 2023-11-03 DIAGNOSIS — S060X0A Concussion without loss of consciousness, initial encounter: Secondary | ICD-10-CM | POA: Insufficient documentation

## 2023-11-03 MED ORDER — ONDANSETRON 8 MG PO TBDP
ORAL_TABLET | ORAL | 0 refills | Status: DC
Start: 2023-11-03 — End: 2024-04-12

## 2023-11-03 NOTE — Assessment & Plan Note (Signed)
 History of Present Illness Melanie Bell is a 33 year old female who presents with a concussion following a fall. She was referred by Dr. Mauricia South from the urgent care for evaluation of a concussion.  She experienced a fall at home, tripping over a metal basket and hitting her head on a toy dump truck. No loss of consciousness occurred during the incident. She has a history of one or two previous concussions, the most recent about a year ago, also resulting from falls due to reported knee instability caused by her osteoarthritis and rheumatoid arthritis.  Since the fall, she has been experiencing headaches, fatigue, low energy, and drowsiness. Additional symptoms rated at three include pressure in the head, nausea, dizziness, light sensitivity, and noise sensitivity. She uses Zofran  as needed for nausea, with the last dose taken yesterday, and has enough medication for the next several days.  She is not currently working and has a seven-year-old son who is out of school for the summer.  Physical Exam NEUROVASCULAR: Coordination intact with normal tandem gait, normal finger to nose, and no dysdiadochokinesia.  Results DIAGNOSTIC Concussion Questionnaire: 16 symptoms, severity 44 (11/03/2023)  Assessment and Plan Concussion Confirmed concussion with moderate symptoms. Previous concussions noted. Cognitive improvement observed. Discussed experimental treatments; rest and symptom management emphasized. - Refill Zofran  for nausea. - Schedule follow-up in two weeks. - Encourage hydration and rest. - Advise light activity. - Avoid alcohol and smoking. - Consider 1-3 g of combined EPA/DHA omega-3 - Use MyChart for new symptoms.

## 2023-11-03 NOTE — Progress Notes (Signed)
 Primary Care / Sports Medicine Office Visit  Patient Information:  Patient ID: Melanie Bell, female DOB: January 03, 1991 Age: 33 y.o. MRN: [id number]   Melanie Bell is a pleasant 33 y.o. female presenting with the following:  Chief Complaint  Patient presents with   Concussion    Patient presents today for a concussion follow up. She  had a fall on 10/28/23 and hit her head. After she hit her head later that day she started feeling dizzy. A few days later the dizziness had not went away so she went to [facility] where she was diagnosed with concussion without loss of consciousness. They gave her gave her Tylenol  and Zofran . She has been taking medications as needed. Today she states she is still dizzy, sleeping a lot, and having headaches.     Vitals:   11/03/23 1050  BP: 124/68  Pulse: 68  SpO2: 97%   Vitals:   11/03/23 1050  Weight: 282 lb 6.4 oz (128.1 kg)  Height: 5' 7 (1.702 m)   Body mass index is 44.23 kg/m.  No results found.   Independent interpretation of notes and tests performed by another provider:   None  Procedures performed:   SCAT5: Total number of symptoms:  16/22 Symptom severity score:  44/132  What month is it? 1 What is the date today? 1 What is the day of the week? 1 What year is it? 1 What time is it right now? (within 1 hour) 1 Cognitive assessment: 5/5   Immediate memory score: 12/15 3, 4, 5    Concentration score:  3/5 (Digits + Months), 2, 1   Neck exam:    - (positive or negative) Coordination exam:  - (positive or negative)   Balance exam:   1/30   Delayed recall score  4/5   SCAT5 (Sport Concussion Assessment Tool, 5th edition) was independently administered and interpreted in full by myself, the physician. Total time spent face-to-face with the patient performing and scoring the standardized neurocognitive and symptom evaluation was 33 minutes  Pertinent History, Exam, Impression, and Recommendations:   Problem  List Items Addressed This Visit     Concussion with no loss of consciousness - Primary   History of Present Illness Melanie Bell is a 33 year old female who presents with a concussion following a fall. She was referred by Dr. Mauricia South from the urgent care for evaluation of a concussion.  She experienced a fall at home, tripping over a metal basket and hitting her head on a toy dump truck. No loss of consciousness occurred during the incident. She has a history of one or two previous concussions, the most recent about a year ago, also resulting from falls due to reported knee instability caused by her osteoarthritis and rheumatoid arthritis.  Since the fall, she has been experiencing headaches, fatigue, low energy, and drowsiness. Additional symptoms rated at three include pressure in the head, nausea, dizziness, light sensitivity, and noise sensitivity. She uses Zofran  as needed for nausea, with the last dose taken yesterday, and has enough medication for the next several days.  She is not currently working and has a seven-year-old son who is out of school for the summer.  Physical Exam NEUROVASCULAR: Coordination intact with normal tandem gait, normal finger to nose, and no dysdiadochokinesia.  Results DIAGNOSTIC Concussion Questionnaire: 16 symptoms, severity 44 (11/03/2023)  Assessment and Plan Concussion Confirmed concussion with moderate symptoms. Previous concussions noted. Cognitive improvement observed. Discussed experimental treatments; rest and  symptom management emphasized. - Refill Zofran  for nausea. - Schedule follow-up in two weeks. - Encourage hydration and rest. - Advise light activity. - Avoid alcohol and smoking. - Consider 1-3 g of combined EPA/DHA omega-3 - Use MyChart for new symptoms.        Orders & Medications Medications:  Meds ordered this encounter  Medications   ondansetron  (ZOFRAN -ODT) 8 MG disintegrating tablet    Sig: 1/2- 1 tablet q 8 hr  prn nausea, vomiting    Dispense:  20 tablet    Refill:  0   No orders of the defined types were placed in this encounter.    Return in about 2 weeks (around 11/17/2023) for concussion f/u, needs questions answered at checkin.     Ma Saupe, MD, [facility]   Primary Care Sports Medicine Primary Care and Sports Medicine at [facility]

## 2023-11-03 NOTE — Patient Instructions (Signed)
 Patient Action Plan  1. Concussion Management:    - Refill and use Zofran  as needed for nausea.    - Focus on rest and staying hydrated to support recovery.    - Engage in light activities but avoid strenuous tasks.    - Avoid alcohol and smoking to prevent worsening symptoms.    - Consider taking 1-3 grams of combined EPA/DHA omega-3 supplements to aid recovery.  2. Follow-up:    - Schedule a follow-up appointment in two weeks to monitor progress.  3. Symptom Monitoring:    - Use MyChart to report any new or worsening symptoms promptly.  Red Flags: - If you experience severe headaches, vomiting, confusion, or any new symptoms, contact your healthcare provider immediately.

## 2023-11-07 ENCOUNTER — Encounter: Payer: Self-pay | Admitting: Obstetrics and Gynecology

## 2023-11-07 ENCOUNTER — Ambulatory Visit: Admitting: Obstetrics and Gynecology

## 2023-11-07 ENCOUNTER — Encounter: Payer: Self-pay | Admitting: Family Medicine

## 2023-11-07 VITALS — BP 110/71 | HR 66 | Ht 67.0 in | Wt 288.0 lb

## 2023-11-07 DIAGNOSIS — L988 Other specified disorders of the skin and subcutaneous tissue: Secondary | ICD-10-CM | POA: Diagnosis not present

## 2023-11-07 DIAGNOSIS — N644 Mastodynia: Secondary | ICD-10-CM | POA: Diagnosis not present

## 2023-11-07 NOTE — Patient Instructions (Signed)
 I value your feedback and you entrusting [name] with your care. If you get a [place] patient survey, I would appreciate you taking the time to let us know about your experience today. Thank you! ? ? ?

## 2023-11-07 NOTE — Progress Notes (Signed)
 Melanie Leita DEL, MD   Chief Complaint  Patient presents with   Breast Exam    Tenderness and sores in both breasts x couple of months.    HPI:      [name] is a 33 y.o. [id number] whose LMP was Patient's last menstrual period was 10/23/2023 (approximate)., presents today for bilat breast tenderness the past 2 months. Sx mostly with touch; worse before menses and better afterwards, but constant for the past 2 months. Does SBE, no masses that she can tell. No nipple d/c. Trying to conceive, not on BC. Drinks 1 caffeine drink daily. Taking voltaren daily already without sx change. FH breast cancer in her mat grt aunt. Hx of LT fibroadenoma removal age [age].  Menses are monthly, lasting 3-5 days.  Has also noticed pimple type sores on her breasts for the past 4 or so months. Never had this many. Washes with dove soap, uses dryer sheets in bras but no product change. No meds to treat.   Patient Active Problem List   Diagnosis Date Noted   Concussion with no loss of consciousness 11/03/2023   Mild intermittent asthma with exacerbation 06/17/2023   Viral URI with cough 06/17/2023   Amenorrhea 04/18/2023   Oropharyngeal dysphagia 04/18/2023   Drug overdose, intentional self-harm, initial encounter (HCC) 01/24/2023   Insomnia 01/22/2023   Drug overdose, multiple drugs, intentional self-harm, initial encounter (HCC) 01/21/2023   Mild intermittent asthma 01/21/2023   Seizure (HCC) 01/16/2023   Major depressive disorder, recurrent episode, severe, without psychosis (HCC) 11/11/2022   Intentional overdose (HCC) 11/08/2022   Morbid obesity (HCC) 09/30/2021   Acquired hypothyroidism 06/25/2021   Migraine without aura and without status migrainosus, not intractable 03/05/2021   Mild intermittent asthma without complication 12/01/2020   Gastroesophageal reflux disease 12/01/2020   Hypoglycemia 12/01/2020   Rheumatoid factor positive 12/09/2019   SS-A antibody positive  10/28/2019   Chronic pain of both knees 10/28/2019   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   Schizoaffective disorder, depressive type (HCC) 02/02/2018   Suicide attempt (HCC) 01/16/2018   Borderline personality disorder (HCC) 01/04/2018   PTSD (post-traumatic stress disorder) 01/03/2018   ASCUS of cervix with negative high risk HPV 08/28/2017   Malingering 05/06/2016    Past Surgical History:  Procedure Laterality Date   ABDOMINAL SURGERY     years ago to remove foreign objects   APPENDECTOMY     BREAST EXCISIONAL BIOPSY Right 09/03/2007   surgical bx removed fibroadeoma   BREAST LUMPECTOMY Right    CHOLECYSTECTOMY  02/2022   COLONOSCOPY WITH PROPOFOL  N/A 09/10/2015   Procedure: COLONOSCOPY WITH PROPOFOL ;  Surgeon: Gladis RAYMOND Mariner, MD;  Location: [facility] ENDOSCOPY;  Service: Endoscopy;  Laterality: N/A;   DIAGNOSTIC LAPAROSCOPY     ESOPHAGOGASTRODUODENOSCOPY N/A 11/28/2014   Procedure: ESOPHAGOGASTRODUODENOSCOPY (EGD);  Surgeon: Lamar ONEIDA Holmes, MD;  Location: [facility] ENDOSCOPY;  Service: Endoscopy;  Laterality: N/A;   ESOPHAGOGASTRODUODENOSCOPY N/A 02/21/2016   Procedure: ESOPHAGOGASTRODUODENOSCOPY (EGD);  Surgeon: Kavitha V Nandigam, MD;  Location: [facility] ENDOSCOPY;  Service: Endoscopy;  Laterality: N/A;   ESOPHAGOGASTRODUODENOSCOPY N/A 04/21/2016   Procedure: ESOPHAGOGASTRODUODENOSCOPY (EGD);  Surgeon: Lupita FORBES Commander, MD;  Location: [facility] ENDOSCOPY;  Service: Endoscopy;  Laterality: N/A;   ESOPHAGOGASTRODUODENOSCOPY N/A 05/06/2016   Procedure: ESOPHAGOGASTRODUODENOSCOPY (EGD);  Surgeon: Ruel Kung, MD;  Location: [facility] ENDOSCOPY;  Service: Gastroenterology;  Laterality: N/A;   ESOPHAGOGASTRODUODENOSCOPY N/A 06/13/2016   Procedure: ESOPHAGOGASTRODUODENOSCOPY (EGD);  Surgeon: Rogelia Copping, MD;  Location: [facility] ENDOSCOPY;  Service: Endoscopy;  Laterality: N/A;  ESOPHAGOGASTRODUODENOSCOPY N/A 01/14/2022   Procedure: ESOPHAGOGASTRODUODENOSCOPY (EGD);  Surgeon: Toledo, Ladell POUR, MD;  Location:  [facility] ENDOSCOPY;  Service: Gastroenterology;  Laterality: N/A;   ESOPHAGOGASTRODUODENOSCOPY N/A 09/12/2023   Procedure: EGD (ESOPHAGOGASTRODUODENOSCOPY);  Surgeon: Toledo, Ladell POUR, MD;  Location: [facility] ENDOSCOPY;  Service: Gastroenterology;  Laterality: N/A;   ESOPHAGOGASTRODUODENOSCOPY (EGD) WITH PROPOFOL  N/A 02/29/2016   Procedure: ESOPHAGOGASTRODUODENOSCOPY (EGD) WITH PROPOFOL ;  Surgeon: Rogelia Copping, MD;  Location: [facility] ENDOSCOPY;  Service: Endoscopy;  Laterality: N/A;   ESOPHAGOGASTRODUODENOSCOPY (EGD) WITH PROPOFOL  N/A 01/15/2018   Procedure: ESOPHAGOGASTRODUODENOSCOPY (EGD) WITH PROPOFOL ;  Surgeon: Shaaron Lamar HERO, MD;  Location: [facility] ENDO SUITE;  Service: Endoscopy;  Laterality: N/A;  retrieval forein body   LAPAROSCOPIC LYSIS OF ADHESIONS  02/16/2022   Procedure: LAPAROSCOPIC LYSIS OF ADHESIONS;  Surgeon: Desiderio Schanz, MD;  Location: [facility] ORS;  Service: General;;   LAPAROTOMY N/A 09/12/2015   Procedure: EXPLORATORY LAPAROTOMY;  Surgeon: Charlie FORBES Fell, MD;  Location: [facility] ORS;  Service: General;  Laterality: N/A;   SIGMOIDOSCOPY N/A 09/12/2015   Procedure: [place];  Surgeon: Charlie FORBES Fell, MD;  Location: [facility] ORS;  Service: General;  Laterality: N/A;   WISDOM TOOTH EXTRACTION      Family History  Problem Relation Age of Onset   Depression Mother    Hypertension Mother    Sleep apnea Mother    Asthma Mother    COPD Mother    Diabetes Mother    Anxiety disorder Mother    Arthritis Mother    Heart failure Mother    Breast cancer Maternal Aunt        20's   Stroke Maternal Grandmother    Heart attack Maternal Grandfather     Social History   Socioeconomic History   Marital status: Married    Spouse name: Not on file   Number of children: 0   Years of education: 12   Highest education level: GED or equivalent  Occupational History   Occupation: unemployed  Tobacco Use   Smoking status: Former    Current packs/day: 0.00    Average packs/day: 0.5 packs/day for  3.0 years (1.5 ttl pk-yrs)    Types: Cigarettes    Start date: 08/14/2013    Quit date: 08/14/2016    Years since quitting: 7.2    Passive exposure: Past   Smokeless tobacco: Never  Vaping Use   Vaping status: Never Used  Substance and Sexual Activity   Alcohol use: No   Drug use: No   Sexual activity: Yes    Partners: Male    Birth control/protection: None  Other Topics Concern   Not on file  Social History Narrative   Right handed    One story home   Drinks occasional caffeine   Social Drivers of Health   Financial Resource Strain: Low Risk  (08/17/2023)   Received from [facility]   Overall Financial Resource Strain (CARDIA)    Difficulty of Paying Living Expenses: Not hard at all  Food Insecurity: No Food Insecurity (08/17/2023)   Received from [facility]   Hunger Vital Sign    Within the past 12 months, you worried that your food would run out before you got the money to buy more.: Never true    Within the past 12 months, the food you bought just didn't last and you didn't have money to get more.: Never true  Transportation Needs: No Transportation Needs (08/17/2023)   Received from [facility]   PRAPARE -  Transportation    In the past 12 months, has lack of transportation kept you from medical appointments or from getting medications?: No    Lack of Transportation (Non-Medical): No  Physical Activity: Insufficiently Active (04/15/2023)   Exercise Vital Sign    Days of Exercise per Week: 4 days    Minutes of Exercise per Session: 20 min  Stress: No Stress Concern Present (04/15/2023)   [employer] of Occupational Health - Occupational Stress Questionnaire    Feeling of Stress : Not at all  Social Connections: Socially Integrated (04/15/2023)   Social Connection and Isolation Panel    Frequency of Communication with Friends and Family: More than three times a week    Frequency of Social Gatherings with Friends and  Family: More than three times a week    Attends Religious Services: More than 4 times per year    Active Member of [employer] or Organizations: Yes    Attends Engineer, structural: More than 4 times per year    Marital Status: Married  Catering manager Violence: Not At Risk (06/15/2023)   Humiliation, Afraid, Rape, and Kick questionnaire    Fear of Current or Ex-Partner: No    Emotionally Abused: No    Physically Abused: No    Sexually Abused: No    Outpatient Medications Prior to Visit  Medication Sig Dispense Refill   albuterol  (VENTOLIN  HFA) 108 (90 Base) MCG/ACT inhaler 2 puffs four times a day as needed (Patient taking differently: Inhale 2 puffs into the lungs every 8 (eight) hours as needed for wheezing or shortness of breath.) 18 g 1   ARIPiprazole  (ABILIFY ) 10 MG tablet Take 10 mg by mouth daily.     cabergoline  (DOSTINEX ) 0.5 MG tablet Take 1 tablet (0.5 mg total) by mouth once a week. 12 tablet 3   Cyanocobalamin  (VITAMIN B-12 PO) Take 1 capsule by mouth daily.     diclofenac (VOLTAREN) 75 MG EC tablet Take 75 mg by mouth 2 (two) times daily.     ipratropium (ATROVENT ) 0.06 % nasal spray Place 2 sprays into both nostrils 4 (four) times daily. 15 mL 12   levothyroxine  (SYNTHROID ) 75 MCG tablet TAKE ONE TABLET BY MOUTH ONCE EVERY DAY BEFORE BREAKFAST 28 tablet 10   Magnesium  500 MG CAPS Take 500 mg by mouth at bedtime.     meloxicam  (MOBIC ) 15 MG tablet Take 15 mg by mouth daily.     omeprazole  (PRILOSEC) 20 MG capsule TAKE 1 CAPSULE (20 MG TOTAL) BY MOUTH DAILY. 90 capsule 2   ondansetron  (ZOFRAN -ODT) 8 MG disintegrating tablet 1/2- 1 tablet q 8 hr prn nausea, vomiting 20 tablet 0   sertraline (ZOLOFT) 100 MG tablet Take 100 mg by mouth daily.     sertraline (ZOLOFT) 50 MG tablet Take 100 mg by mouth daily.     No facility-administered medications prior to visit.      ROS:  Review of Systems  Constitutional:  Negative for fever.  Gastrointestinal:  Negative for  blood in stool, constipation, diarrhea, nausea and vomiting.  Genitourinary:  Negative for dyspareunia, dysuria, flank pain, frequency, hematuria, urgency, vaginal bleeding, vaginal discharge and vaginal pain.  Musculoskeletal:  Negative for back pain.  Skin:  Negative for rash.   BREAST: pain/lesions   OBJECTIVE:   Vitals:  BP 110/71   Pulse 66   Ht 5' 7 (1.702 m)   Wt 288 lb (130.6 kg)   LMP 10/23/2023 (Approximate)   BMI 45.11 kg/m   Physical  Exam Vitals reviewed.  Pulmonary:     Effort: Pulmonary effort is normal.  Chest:  Breasts:    Breasts are symmetrical.     Right: Tenderness present. No inverted nipple, mass, nipple discharge or skin change.     Left: Tenderness present. No inverted nipple, mass, nipple discharge or skin change.    Musculoskeletal:        General: Normal range of motion.     Cervical back: Normal range of motion.   Skin:    General: Skin is warm and dry.   Neurological:     General: No focal deficit present.     Mental Status: She is alert and oriented to person, place, and time.     Cranial Nerves: No cranial nerve deficit.   Psychiatric:        Mood and Affect: Mood normal.        Behavior: Behavior normal.        Thought Content: Thought content normal.        Judgment: Judgment normal.    Assessment/Plan: Mastodynia--pt with bilat breast tenderness for 2 months, sx wax/wane with menses. Neg exam today other than tenderness. D/C caffeine, try Vitamin E 400 units BID/tylenol --already on voltaren/ heating pad or ice packs. F/u if sx persist in 1-2 months for mammo and u/s.   Skin lesion of breast--c/w acne. Line dry bra in case chem derm/get out of damp bra when can. Reassurance. F/u prn.     Return if symptoms worsen or fail to improve.  Burhanuddin Kohlmann B. Idania Desouza, PA-C 11/07/2023 4:36 PM

## 2023-11-10 ENCOUNTER — Other Ambulatory Visit: Payer: Self-pay

## 2023-11-10 ENCOUNTER — Emergency Department
Admission: EM | Admit: 2023-11-10 | Discharge: 2023-11-10 | Disposition: A | Discharge status: Home / Self Care | Attending: Emergency Medicine | Admitting: Emergency Medicine

## 2023-11-10 DIAGNOSIS — G2402 Drug induced acute dystonia: Secondary | ICD-10-CM | POA: Insufficient documentation

## 2023-11-10 LAB — BASIC METABOLIC PANEL WITH GFR
Anion gap: 9 (ref 5–15)
BUN: 13 mg/dL (ref 6–20)
CO2: 21 mmol/L — ABNORMAL LOW (ref 22–32)
Calcium: 9.3 mg/dL (ref 8.9–10.3)
Chloride: 109 mmol/L (ref 98–111)
Creatinine, Ser: 0.55 mg/dL (ref 0.44–1.00)
GFR, Estimated: >60 mL/min (ref >60)
Glucose, Bld: 98 mg/dL (ref 70–99)
Potassium: 4 mmol/L (ref 3.5–5.1)
Sodium: 139 mmol/L (ref 135–145)

## 2023-11-10 LAB — CBC
HCT: 41.9 % (ref 36.0–46.0)
Hemoglobin: 14.1 g/dL (ref 12.0–15.0)
MCH: 30.7 pg (ref 26.0–34.0)
MCHC: 33.7 g/dL (ref 30.0–36.0)
MCV: 91.1 fL (ref 80.0–100.0)
Platelets: 197 10*3/uL (ref 150–400)
RBC: 4.6 MIL/uL (ref 3.87–5.11)
RDW: 13 % (ref 11.5–15.5)
WBC: 8.7 10*3/uL (ref 4.0–10.5)
nRBC: 0 % (ref 0.0–0.2)

## 2023-11-10 NOTE — Discharge Instructions (Signed)
 You were seen in the emergency department today for evaluation after a suspected dystonic reaction.  Please contact your psychiatrist to discuss her medications and any recommended changes.  I recommend taking 25 mg of Benadryl  every 6 hours for the next 3 days to try and prevent a recurrent reaction.  Return to the ER for any new or worsening symptoms.

## 2023-11-10 NOTE — ED Provider Notes (Signed)
 [facility] Provider Note    Event Date/Time   First MD Initiated Contact with Patient 11/10/23 1815     (approximate)   History   Medication Reaction   HPI  Melanie Bell is a 33 year old female with history of schizoaffective disorder, borderline personality disorder, depression presenting to the ER for evaluation of abnormal mouth movements. Patient reports that earlier today shortly after taking her medications she developed abnormal mouth movements with painful spasms. Denies history of similar. EMS suspected a possible dystonic reaction and she was given 50 mg of benadryl  with rapid improvement in her symptoms without recurrence since that time. Patient notes that she is taking Abilify  and Zoloft, does think her dose of Zoloft was increased recently, is unsure if her Abilify  dose has recently changed.  These are managed by her psychiatrist.  Currently feels back at baseline.  No numbness, tingling, focal weakness.     Physical Exam   Triage Vital Signs: ED Triage Vitals [11/10/23 1608]  Encounter Vitals Group     BP 137/84     Girls Systolic BP Percentile      Girls Diastolic BP Percentile      Boys Systolic BP Percentile      Boys Diastolic BP Percentile      Pulse Rate 67     Resp 18     Temp 99 F (37.2 C)     Temp Source Oral     SpO2 99 %     Weight 288 lb (130.6 kg)     Height 5' 7 (1.702 m)     Head Circumference      Peak Flow      Pain Score 2     Pain Loc      Pain Education      Exclude from Growth Chart     Most recent vital signs: Vitals:   11/10/23 1608  BP: 137/84  Pulse: 67  Resp: 18  Temp: 99 F (37.2 C)  SpO2: 99%     General: Awake, interactive  CV:  Regular rate, good peripheral perfusion.  Resp:  Unlabored respirations.  Abd:  Nondistended.  Neuro:  Alert and oriented, normal extraocular movements, symmetric facial movement, sensation intact over bilateral upper and lower extremities with 5 out of  5 strength.  Normal finger-to-nose testing.   ED Results / Procedures / Treatments   Labs (all labs ordered are listed, but only abnormal results are displayed) Labs Reviewed  BASIC METABOLIC PANEL WITH GFR - Abnormal; Notable for the following components:      Result Value   CO2 21 (*)    All other components within normal limits  CBC     EKG EKG independently reviewed and interpreted by myself demonstrates:    RADIOLOGY Imaging independently reviewed and interpreted by myself demonstrates:   Formal Radiology Read:  No results found.  PROCEDURES:  Critical Care performed: No  Procedures   MEDICATIONS ORDERED IN ED: Medications - No data to display   IMPRESSION / MDM / ASSESSMENT AND PLAN / ED COURSE  I reviewed the triage vital signs and the nursing notes.  Differential diagnosis includes, but is not limited to, dystonic reaction, much lower suspicion stroke, TIA, other acute neurologic process given clinical history, rapid improvement with Benadryl , reassuring exam here  Patient's presentation is most consistent with acute complicated illness / injury requiring diagnostic workup.  33 year old female presenting to the emergency department for evaluation of an episode of mouth  spasms resolved with Benadryl .  Clinical history is concerning for dystonic reaction.  Labs sent from triage without critical derangements.  Patient did have sustained improvement here after receiving Benadryl  with EMS.  She does have an extensive psychiatric history, so I am hesitant to have her discontinue her psychiatric medications, but she was instructed to discuss any recommended medication changes with her psychiatrist.  She is comfortable with this plan.  She was instructed to take scheduled Benadryl  over the next 72 hours to prevent recurrent reaction.  Strict return precautions were provided.  Patient was discharged in stable condition.      FINAL CLINICAL IMPRESSION(S) / ED DIAGNOSES    Final diagnoses:  Dystonic drug reaction     Rx / DC Orders   ED Discharge Orders     None        Note:  This document was prepared using Dragon voice recognition software and may include unintentional dictation errors.   Levander Slate, MD 11/10/23 [phone]

## 2023-11-10 NOTE — ED Triage Notes (Signed)
 First nurse note: Dystonia and facial paralysis. Takes psych medicines.  50mg  benadryl  given by EMS and reports dystonia subsided  EMS vitals: 122/76 b/po 70HR 100% RA 83 CBG  24g Left wrist

## 2023-11-10 NOTE — ED Triage Notes (Signed)
 Pt to ED via [facility] from home. Pt reports dystonia reaction to medication. Pt reports is on Zoloft and recently had dose increased. Pt reports relief of symptoms after benadryl  given by EMS. Pt has 24g in left wrist.

## 2023-11-13 ENCOUNTER — Telehealth: Payer: Self-pay

## 2023-11-13 NOTE — Transitions of Care (Post Inpatient/ED Visit) (Signed)
 11/13/2023  [name] MRN: [id number] DOB: 1991/05/14  Today's TOC FU Call Status:   Patient's Name and Date of Birth confirmed.  Transition Care Management Follow-up Telephone Call Date of Discharge: 11/13/23 Discharge Facility: [facility]) Type of Discharge: Emergency Department Reason for ED Visit: Other: (Dystonia) How have you been since you were released from the hospital?: Same Any questions or concerns?: Yes Patient Questions/Concerns:: Vomiting/Diarrhea and Low grade fever Patient Questions/Concerns Addressed: Provided Patient Educational Materials  Items Reviewed: Did you receive and understand the discharge instructions provided?: No Medications obtained,verified, and reconciled?: Yes (Medications Reviewed) Any new allergies since your discharge?: No Dietary orders reviewed?: NA Do you have support at home?: Yes People in Home [RPT]: spouse  Medications Reviewed Today: Medications Reviewed Today     Reviewed by Larosa Rhines N, CMA (Certified Medical Assistant) on 11/13/23 at 1058  Med List Status: <None>   Medication Order Taking? Sig Documenting Provider Last Dose Status Informant  albuterol  (VENTOLIN  HFA) 108 (90 Base) MCG/ACT inhaler [id number]  2 puffs four times a day as needed  Patient taking differently: Inhale 2 puffs into the lungs every 8 (eight) hours as needed for wheezing or shortness of breath.   Brimage, Vondra, DO  Active Self, Pharmacy Records  ARIPiprazole  (ABILIFY ) 10 MG tablet [id number]  Take 10 mg by mouth daily. [provider]  Active   cabergoline  (DOSTINEX ) 0.5 MG tablet [id number]  Take 1 tablet (0.5 mg total) by mouth once a week. Cam Charlie Loving, DO  Active Self, Pharmacy Records  Cyanocobalamin  (VITAMIN B-12 PO) [id number]  Take 1 capsule by mouth daily. [provider]  Active Self, Pharmacy Records  diclofenac (VOLTAREN) 75 MG EC tablet [id number]  Take 75 mg by mouth  2 (two) times daily. [provider]  Active   ipratropium (ATROVENT ) 0.06 % nasal spray [id number]  Place 2 sprays into both nostrils 4 (four) times daily. Bernardino Ditch, NP  Active   levothyroxine  (SYNTHROID ) 75 MCG tablet [id number]  TAKE ONE TABLET BY MOUTH ONCE EVERY DAY BEFORE BREAKFAST Shamleffer, Ibtehal Jaralla, MD  Active   Magnesium  500 MG CAPS [id number]  Take 500 mg by mouth at bedtime. [provider]  Active Self, Pharmacy Records  meloxicam  (MOBIC ) 15 MG tablet [id number]  Take 15 mg by mouth daily. [provider]  Active Self  omeprazole  (PRILOSEC) 20 MG capsule [id number]  TAKE 1 CAPSULE (20 MG TOTAL) BY MOUTH DAILY. Justus Leita DEL, MD  Active Pharmacy Records, Self  ondansetron  (ZOFRAN -ODT) 8 MG disintegrating tablet [id number]  1/2- 1 tablet q 8 hr prn nausea, vomiting Matthews, Jason J, MD  Active   sertraline (ZOLOFT) 100 MG tablet [id number]  Take 100 mg by mouth daily. [provider]  Active   sertraline (ZOLOFT) 50 MG tablet [id number]  Take 100 mg by mouth daily. [provider]  Active Self            Home Care and Equipment/Supplies: Were Home Health Services Ordered?: NA Any new equipment or medical supplies ordered?: NA  Functional Questionnaire: Do you need assistance with bathing/showering or dressing?: No Do you need assistance with meal preparation?: No Do you need assistance with eating?: No Do you have difficulty maintaining continence: No Do you need assistance with getting out of bed/getting out of a chair/moving?: No Do you have difficulty managing or taking your medications?: No  Follow up appointments reviewed: PCP Follow-up appointment confirmed?: NA Specialist Hospital Follow-up appointment confirmed?:  NA Do you need transportation to your follow-up appointment?: No Do you understand care options if your condition(s) worsen?: Yes-patient verbalized understanding    Jahir Halt Harahan   Primary Care & Sports Medicine at [facility] CMA, AAMA [address]  [place] [address] Office [phone]  Fax: [phone]

## 2023-11-14 ENCOUNTER — Other Ambulatory Visit: Payer: Self-pay

## 2023-11-14 ENCOUNTER — Emergency Department
Admission: EM | Admit: 2023-11-14 | Discharge: 2023-11-14 | Disposition: A | Discharge status: Home / Self Care | Attending: Emergency Medicine | Admitting: Emergency Medicine

## 2023-11-14 DIAGNOSIS — F32A Depression, unspecified: Secondary | ICD-10-CM | POA: Diagnosis present

## 2023-11-14 DIAGNOSIS — F4321 Adjustment disorder with depressed mood: Secondary | ICD-10-CM | POA: Insufficient documentation

## 2023-11-14 DIAGNOSIS — R45851 Suicidal ideations: Secondary | ICD-10-CM | POA: Insufficient documentation

## 2023-11-14 LAB — CBC WITH DIFFERENTIAL/PLATELET
Abs Immature Granulocytes: 0.02 10*3/uL (ref 0.00–0.07)
Basophils Absolute: 0 10*3/uL (ref 0.0–0.1)
Basophils Relative: 0 %
Eosinophils Absolute: 0.1 10*3/uL (ref 0.0–0.5)
Eosinophils Relative: 2 %
HCT: 42.2 % (ref 36.0–46.0)
Hemoglobin: 13.8 g/dL (ref 12.0–15.0)
Immature Granulocytes: 0 %
Lymphocytes Relative: 21 %
Lymphs Abs: 2 10*3/uL (ref 0.7–4.0)
MCH: 30.5 pg (ref 26.0–34.0)
MCHC: 32.7 g/dL (ref 30.0–36.0)
MCV: 93.2 fL (ref 80.0–100.0)
Monocytes Absolute: 0.5 10*3/uL (ref 0.1–1.0)
Monocytes Relative: 6 %
Neutro Abs: 6.5 10*3/uL (ref 1.7–7.7)
Neutrophils Relative %: 71 %
Platelets: 192 10*3/uL (ref 150–400)
RBC: 4.53 MIL/uL (ref 3.87–5.11)
RDW: 12.9 % (ref 11.5–15.5)
WBC: 9.2 10*3/uL (ref 4.0–10.5)
nRBC: 0 % (ref 0.0–0.2)

## 2023-11-14 LAB — COMPREHENSIVE METABOLIC PANEL WITH GFR
ALT: 13 U/L (ref 0–44)
AST: 18 U/L (ref 15–41)
Albumin: 3.6 g/dL (ref 3.5–5.0)
Alkaline Phosphatase: 52 U/L (ref 38–126)
Anion gap: 6 (ref 5–15)
BUN: 13 mg/dL (ref 6–20)
CO2: 26 mmol/L (ref 22–32)
Calcium: 9.1 mg/dL (ref 8.9–10.3)
Chloride: 108 mmol/L (ref 98–111)
Creatinine, Ser: 0.67 mg/dL (ref 0.44–1.00)
GFR, Estimated: >60 mL/min (ref >60)
Glucose, Bld: 110 mg/dL — ABNORMAL HIGH (ref 70–99)
Potassium: 3.9 mmol/L (ref 3.5–5.1)
Sodium: 140 mmol/L (ref 135–145)
Total Bilirubin: 1 mg/dL (ref 0.0–1.2)
Total Protein: 6.7 g/dL (ref 6.5–8.1)

## 2023-11-14 LAB — ETHANOL: Alcohol, Ethyl (B): <15 mg/dL (ref <15)

## 2023-11-14 LAB — CBC
HCT: 41.7 % (ref 36.0–46.0)
Hemoglobin: 13.8 g/dL (ref 12.0–15.0)
MCH: 30.9 pg (ref 26.0–34.0)
MCHC: 33.1 g/dL (ref 30.0–36.0)
MCV: 93.3 fL (ref 80.0–100.0)
Platelets: 192 10*3/uL (ref 150–400)
RBC: 4.47 MIL/uL (ref 3.87–5.11)
RDW: 12.8 % (ref 11.5–15.5)
WBC: 9.2 10*3/uL (ref 4.0–10.5)
nRBC: 0 % (ref 0.0–0.2)

## 2023-11-14 LAB — LIPASE, BLOOD: Lipase: 32 U/L (ref 11–51)

## 2023-11-14 MED ORDER — LAMOTRIGINE 25 MG PO TABS: 25.0000 mg | ORAL_TABLET | Freq: Two times a day (BID) | ORAL | 2 refills | Status: DC

## 2023-11-14 MED ORDER — ONDANSETRON HCL 4 MG/2ML IJ SOLN
4.0000 mg | Freq: Once | INTRAMUSCULAR | Status: AC
  Administered 2023-11-14: 4 mg via INTRAVENOUS
  Filled 2023-11-14: qty 2

## 2023-11-14 NOTE — ED Notes (Signed)
 Patient provided with cab voucher. All belongings returned to patient at time of discharge.

## 2023-11-14 NOTE — Consult Note (Signed)
 Patient is a 33 year old female who was seen through telemedicine today patient has history of significant depression and anxiety she reportedly was on Zyprexa in the past and had side effect she came in because she felt overwhelmed according to her because of stressors of finances and not having a car patient is denying any suicidal and homicidal thoughts she denies any symptoms of mania hypomania psychosis \\Blood pressure 103/67, pulse 85, temperature 98.7 F (37.1 C), temperature source Oral, resp. rate 18, height 5' 7 (1.702 m), weight 130.6 kg, last menstrual period 10/23/2023, SpO2 99%.   No current facility-administered medications for this encounter.   Current Outpatient Medications  Medication Sig Dispense Refill   albuterol  (VENTOLIN  HFA) 108 (90 Base) MCG/ACT inhaler 2 puffs four times a day as needed (Patient taking differently: Inhale 2 puffs into the lungs every 8 (eight) hours as needed for wheezing or shortness of breath.) 18 g 1   ARIPiprazole  (ABILIFY ) 10 MG tablet Take 10 mg by mouth daily.     cabergoline  (DOSTINEX ) 0.5 MG tablet Take 1 tablet (0.5 mg total) by mouth once a week. 12 tablet 3   Cyanocobalamin  (VITAMIN B-12 PO) Take 1 capsule by mouth daily.     diclofenac (VOLTAREN) 75 MG EC tablet Take 75 mg by mouth 2 (two) times daily.     ipratropium (ATROVENT ) 0.06 % nasal spray Place 2 sprays into both nostrils 4 (four) times daily. 15 mL 12   lamoTRIgine (LAMICTAL) 25 MG tablet Take 1 tablet (25 mg total) by mouth 2 (two) times daily. 60 tablet 2   levothyroxine  (SYNTHROID ) 75 MCG tablet TAKE ONE TABLET BY MOUTH ONCE EVERY DAY BEFORE BREAKFAST 28 tablet 10   Magnesium  500 MG CAPS Take 500 mg by mouth at bedtime.     meloxicam  (MOBIC ) 15 MG tablet Take 15 mg by mouth daily.     omeprazole  (PRILOSEC) 20 MG capsule TAKE 1 CAPSULE (20 MG TOTAL) BY MOUTH DAILY. 90 capsule 2   ondansetron  (ZOFRAN -ODT) 8 MG disintegrating tablet 1/2- 1 tablet q 8 hr prn nausea, vomiting 20  tablet 0   sertraline (ZOLOFT) 100 MG tablet Take 100 mg by mouth daily.     sertraline (ZOLOFT) 50 MG tablet Take 100 mg by mouth daily.        Latest Ref Rng & Units 11/14/2023   12:32 PM 11/10/2023    4:18 PM 08/30/2023    8:53 PM  CBC  WBC 4.0 - 10.5 K/uL 4.0 - 10.5 K/uL 9.2    9.2  8.7  10.2   Hemoglobin 12.0 - 15.0 g/dL 87.9 - 84.9 g/dL 86.1    86.1  85.8  86.3   Hematocrit 36.0 - 46.0 % 36.0 - 46.0 % 41.7    42.2  41.9  41.1   Platelets 150 - 400 K/uL 150 - 400 K/uL 192    192  197  192     Mental status examination patient is a 33 year old female who appears stated age alert oriented x3 denies any suicidal homicidal thoughts no perceptual disturbances insight and judgment fair   Assessment adjustment disorder with depressed mood  Plan patient is psychiatrically stable for discharge can be discharged on Lamictal 25 mg p.o. b.I.d.

## 2023-11-14 NOTE — BH Assessment (Signed)
 Comprehensive Clinical Assessment (CCA) Note   11/14/2023 Melanie Bell [id number]   Disposition : Per Dr. Ruther, pt is psych cleared.  The patient demonstrates the following risk factors for suicide: Chronic risk factors for suicide include: previous suicide attempts  . Acute risk factors for suicide include: social withdrawal/isolation. Protective factors for this patient include: positive social support. Considering these factors, the overall suicide risk at this point appears to be low. Patient is appropriate for outpatient follow up.    Per Triage Note:  Patient presents via acems with c/o passive suicidal thoughts. She reports no plan and no intention to act on these thoughts. She denies HI/AH/VH.      She reported she was recently taken off of her abilify  and zoloft on the 27th of June due to dystonia (Reported my mouth was twisted side ways). Patient reported since then she has noticed increased suicidal thoughts. She reported she was not placed on any other medications yet. Patient is calm and cooperative during triage and through dress out process.    Upon evaluation with this clinician, the patient is alert, oriented x 3, and cooperative. Speech is clear, coherent and logical. Pt appears casual. Eye contact is fair. Mood is anxious and depressed; affect is congruent with mood. The thought process is logical and thought content is coherent. Pt endorses  passive SI without a plan/intent. Pt denies HI/AVH. There is no indication that the patient is responding to internal stimuli. No delusions elicited during this assessment.      Chief Complaint:  Chief Complaint  Patient presents with   Suicidal   Visit Diagnosis: Assessment adjustment disorder with depressed mood     CCA Screening, Triage and Referral (STR)  Patient Reported Information How did you hear about us ? -- [facility] ED)  What Is the Reason for Your Visit/Call Today? Per Triage Note:  Patient presents via acems  with c/o passive suicidal thoughts. She reports no plan and no intention to act on these thoughts. She denies HI/AH/VH.      She reported she was recently taken off of her abilify  and zoloft on the 27th of June due to dystonia (Reported my mouth was twisted side ways). Patient reported since then she has noticed increased suicidal thoughts. She reported she was not placed on any other medications yet. Patient is calm and cooperative during triage and through dress out process.    How Long Has This Been Causing You Problems? > than 6 months  What Do You Feel Would Help You the Most Today? Treatment for Depression or other mood problem; Medication(s)   Have You Recently Had Any Thoughts About Hurting Yourself? Yes  Are You Planning to Commit Suicide/Harm Yourself At This time? No   Flowsheet Row ED from 11/14/2023 in [facility] Department at [facility] ED from 11/10/2023 in [facility] Emergency Department at [facility] UC from 10/30/2023 in [facility]   C-SSRS RISK CATEGORY Low Risk No Risk No Risk    Have you Recently Had Thoughts About Hurting Someone [name]? No  Are You Planning to Harm Someone at This Time? No  Explanation: Denies HI   Have You Used Any Alcohol or Drugs in the Past 24 Hours? No  How Long Ago Did You Use Drugs or Alcohol? N/a What Did You Use and How Much? n/a   Do You Currently Have a Therapist/Psychiatrist? Yes  Name of Therapist/Psychiatrist: Name of Therapist/Psychiatrist: Pt is connected with [address]  Have You Been Recently Discharged From Any Office Practice or Programs? No  Explanation of Discharge From Practice/Program: n/a     CCA Screening Triage Referral Assessment Type of Contact: Face-to-Face  Telemedicine Service Delivery:   Is this Initial or Reassessment?   Date Telepsych consult ordered in [facility]:    Time Telepsych consult ordered in [facility]:    Location of Assessment: [facility] ED  Provider  Location: [facility] ED   Collateral Involvement: None provided   Does Patient Have a Court Appointed Legal Guardian? No  Legal Guardian Contact Information: n/a  Copy of Legal Guardianship Form: -- (n/a)  Legal Guardian Notified of Arrival: -- (n/a)  Legal Guardian Notified of Pending Discharge: -- (n/a)  If Minor and Not Living with Parent(s), Who has Custody? n/a  Is CPS involved or ever been involved? Never  Is APS involved or ever been involved? Never   Patient Determined To Be At Risk for Harm To Self or Others Based on Review of Patient Reported Information or Presenting Complaint? Yes, for Self-Harm  Method: No Plan  Availability of Means: In hand or used  Intent: Vague intent or NA  Notification Required: No need or identified person  Additional Information for Danger to Others Potential: -- (n/a)  Additional Comments for Danger to Others Potential: n/a  Are There Guns or Other Weapons in Your Home? No  Types of Guns/Weapons: n/a  Are These Weapons Safely Secured?                            No (n/a)  Who Could Verify You Are Able To Have These Secured: n/a  Do You Have any Outstanding Charges, Pending Court Dates, Parole/Probation? Denies pending legal charges  Contacted To Inform of Risk of Harm To Self or Others: -- (n/a)    Does Patient Present under Involuntary Commitment? No    [place] of Residence:    Patient Currently Receiving the Following Services: Individual Therapy; Medication Management   Determination of Need: Routine (7 days)   Options For Referral: Medication Management     CCA Biopsychosocial Patient Reported Schizophrenia/Schizoaffective Diagnosis in Past: No   Strengths: Patient able to communicate and verbalize needs.   Mental Health Symptoms Depression:  Change in energy/activity; Difficulty Concentrating; Sleep (too much or little)   Duration of Depressive symptoms: Duration of Depressive Symptoms: Greater  than two weeks   Mania:  N/A   Anxiety:   Difficulty concentrating; Worrying   Psychosis:  None   Duration of Psychotic symptoms:    Trauma:  N/A   Obsessions:  N/A   Compulsions:  N/A   Inattention:  N/A   Hyperactivity/Impulsivity:  N/A   Oppositional/Defiant Behaviors:  N/A   Emotional Irregularity:  Potentially harmful impulsivity   Other Mood/Personality Symptoms:  n/a    Mental Status Exam Appearance and self-care  Stature:  Average   Weight:  Average weight   Clothing:  Disheveled   Grooming:  Normal   Cosmetic use:  None   Posture/gait:  Tense   Motor activity:  Not Remarkable   Sensorium  Attention:  Normal   Concentration:  Normal   Orientation:  X5   Recall/memory:  Normal   Affect and Mood  Affect:  Depressed; Flat   Mood:  Depressed; Hopeless   Relating  Eye contact:  Normal   Facial expression:  Depressed; Sad   Attitude toward examiner:  Cooperative   Thought and Language  Speech  flow: Clear and Coherent   Thought content:  Appropriate to Mood and Circumstances   Preoccupation:  None   Hallucinations:  None   Organization:  Circumstantial   Company secretary of Knowledge:  Average   Intelligence:  Average   Abstraction:  Normal   Judgement:  Impaired   Reality Testing:  Adequate   Insight:  Fair   Decision Making:  Impulsive   Social Functioning  Social Maturity:  Impulsive   Social Judgement:  Victimized   Stress  Stressors:  Illness; Financial; Other (Comment) (Lack of transportation)   Coping Ability:  Deficient supports; Overwhelmed   Skill Deficits:  Decision making   Supports:  n/a    Religion: Religion/Spirituality Are You A Religious Person?: No How Might This Affect Treatment?: n/a  Leisure/Recreation: Leisure / Recreation Do You Have Hobbies?: No Leisure and Hobbies: n/a  Exercise/Diet: Exercise/Diet Do You Exercise?: No Have You Gained or Lost A Significant Amount of  Weight in the Past Six Months?: No Do You Follow a Special Diet?: No Do You Have Any Trouble Sleeping?: No Explanation of Sleeping Difficulties: None reported   CCA Employment/Education Employment/Work Situation: Employment / Work Situation Employment Situation: On disability Why is Patient on Disability: n/a How Long has Patient Been on Disability: n/a Patient's Job has Been Impacted by Current Illness: No Has Patient ever Been in the [employer]?: No  Education: Education Is Patient Currently Attending School?: No Last Grade Completed: 12 Did You Attend College?: No Did You Have An Individualized Education Program (IIEP): No Did You Have Any Difficulty At School?: No Patient's Education Has Been Impacted by Current Illness: No   CCA Family/Childhood History Family and Relationship History: Family history Marital status: Married Number of Years Married:  (unknown) What types of issues is patient dealing with in the relationship?: none reported Additional relationship information: n/a Does patient have children?: Yes How many children?:  (UTA) How is patient's relationship with their children?: UTA  Childhood History:  Childhood History By whom was/is the patient raised?: Mother/father and step-parent Did patient suffer any verbal/emotional/physical/sexual abuse as a child?: No Did patient suffer from severe childhood neglect?: No Has patient ever been sexually abused/assaulted/raped as an adolescent or adult?: No Was the patient ever a victim of a crime or a disaster?: No Witnessed domestic violence?: No Has patient been affected by domestic violence as an adult?: No       CCA Substance Use Alcohol/Drug Use: Alcohol / Drug Use Pain Medications: see MAR Prescriptions: see MAR Over the Counter: see MAR History of alcohol / drug use?: No history of alcohol / drug abuse Longest period of sobriety (when/how long): Pt  denies drug use Negative Consequences of Use:  Personal relationships Withdrawal Symptoms:  (n/a)                         ASAM's:  Six Dimensions of Multidimensional Assessment  Dimension 1:  Acute Intoxication and/or Withdrawal Potential:      Dimension 2:  Biomedical Conditions and Complications:      Dimension 3:  Emotional, Behavioral, or Cognitive Conditions and Complications:     Dimension 4:  Readiness to Change:     Dimension 5:  Relapse, Continued use, or Continued Problem Potential:     Dimension 6:  Recovery/Living Environment:     ASAM Severity Score:    ASAM Recommended Level of Treatment: ASAM Recommended Level of Treatment:  (n/a)   Substance use Disorder (  SUD) Substance Use Disorder (SUD)  Checklist Symptoms of Substance Use:  (n/a)  Recommendations for Services/Supports/Treatments: Recommendations for Services/Supports/Treatments Recommendations For Services/Supports/Treatments: Medication Management  Disposition Recommendation per psychiatric provider: There are no psychiatric contraindications to discharge at this time   DSM5 Diagnoses: Patient Active Problem List   Diagnosis Date Noted   Concussion with no loss of consciousness 11/03/2023   Mild intermittent asthma with exacerbation 06/17/2023   Viral URI with cough 06/17/2023   Amenorrhea 04/18/2023   Oropharyngeal dysphagia 04/18/2023   Drug overdose, intentional self-harm, initial encounter (HCC) 01/24/2023   Insomnia 01/22/2023   Drug overdose, multiple drugs, intentional self-harm, initial encounter (HCC) 01/21/2023   Mild intermittent asthma 01/21/2023   Seizure (HCC) 01/16/2023   Major depressive disorder, recurrent episode, severe, without psychosis (HCC) 11/11/2022   Intentional overdose (HCC) 11/08/2022   Morbid obesity (HCC) 09/30/2021   Acquired hypothyroidism 06/25/2021   Migraine without aura and without status migrainosus, not intractable 03/05/2021   Mild intermittent asthma without complication 12/01/2020    Gastroesophageal reflux disease 12/01/2020   Hypoglycemia 12/01/2020   Rheumatoid factor positive 12/09/2019   SS-A antibody positive 10/28/2019   Chronic pain of both knees 10/28/2019   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   Schizoaffective disorder, depressive type (HCC) 02/02/2018   Suicide attempt (HCC) 01/16/2018   Borderline personality disorder (HCC) 01/04/2018   PTSD (post-traumatic stress disorder) 01/03/2018   ASCUS of cervix with negative high risk HPV 08/28/2017   Malingering 05/06/2016     Referrals to Alternative Service(s): Referred to Alternative Service(s):   Place:   Date:   Time:    Referred to Alternative Service(s):   Place:   Date:   Time:    Referred to Alternative Service(s):   Place:   Date:   Time:    Referred to Alternative Service(s):   Place:   Date:   Time:     Kimesha Claxton, [place], [facility]

## 2023-11-14 NOTE — ED Provider Notes (Signed)
 [facility] Provider Note    Event Date/Time   First MD Initiated Contact with Patient 11/14/23 1301     (approximate)   History   Suicidal   HPI  Melanie Bell is a 33 y.o. female who presents to the emergency department today because of concern for suicidal thoughts.  Patient states today have been intermittent.  She says she does not think she would actually at home.  She was taken off of her psychiatric medications last week secondary to dystonia.  She is looking for help and possibly new medication. In addition she complains of GI issues that have been ongoing for a while.  She is already following up with GI.     Physical Exam   Triage Vital Signs: ED Triage Vitals  Encounter Vitals Group     BP 11/14/23 1227 103/67     Girls Systolic BP Percentile --      Girls Diastolic BP Percentile --      Boys Systolic BP Percentile --      Boys Diastolic BP Percentile --      Pulse Rate 11/14/23 1227 85     Resp 11/14/23 1227 18     Temp 11/14/23 1227 98.7 F (37.1 C)     Temp Source 11/14/23 1227 Oral     SpO2 11/14/23 1227 99 %     Weight 11/14/23 1247 288 lb (130.6 kg)     Height 11/14/23 1247 5' 7 (1.702 m)     Head Circumference --      Peak Flow --      Pain Score 11/14/23 1246 5     Pain Loc --      Pain Education --      Exclude from Growth Chart --     Most recent vital signs: Vitals:   11/14/23 1227  BP: 103/67  Pulse: 85  Resp: 18  Temp: 98.7 F (37.1 C)  SpO2: 99%   General: Awake, alert, oriented. CV:  Good peripheral perfusion. Regular rate and rhythm Resp:  Normal effort. Lungs clear. Abd:  No distention.  Other:  Calm. Does not appear to be responding to internal stimuli.   ED Results / Procedures / Treatments   Labs (all labs ordered are listed, but only abnormal results are displayed) Labs Reviewed  COMPREHENSIVE METABOLIC PANEL WITH GFR - Abnormal; Notable for the following components:      Result Value    Glucose, Bld 110 (*)    All other components within normal limits  ETHANOL  CBC  CBC WITH DIFFERENTIAL/PLATELET  LIPASE, BLOOD  URINE DRUG SCREEN, QUALITATIVE ([facility] ONLY)  POC URINE PREG, ED     EKG  None   RADIOLOGY None   PROCEDURES:  Critical Care performed: No  MEDICATIONS ORDERED IN ED: Medications  ondansetron  (ZOFRAN ) injection 4 mg (4 mg Intravenous Given 11/14/23 1254)     IMPRESSION / MDM / ASSESSMENT AND PLAN / ED COURSE  I reviewed the triage vital signs and the nursing notes.                              Differential diagnosis includes, but is not limited to, depression, SI, drug induced mood disorder.  Patient's presentation is most consistent with acute presentation with potential threat to life or bodily function.   Patient presented to the emergency department today because of concern for thoughts of self harm. States that  it has been worse since being taken off of her medication. On exam patient is calm. Also complains of GI issues which she is seeing GI for. Blood work here without concerning abnormality.   Psychiatry has evaluated patient. Will plan on discharging with prescription recommended by psychiatry.       FINAL CLINICAL IMPRESSION(S) / ED DIAGNOSES   Final diagnoses:  Depression, unspecified depression type     Note:  This document was prepared using Dragon voice recognition software and may include unintentional dictation errors.    Floy Roberts, MD 11/14/23 [id number]

## 2023-11-14 NOTE — ED Triage Notes (Signed)
 Patient presents via acems with c/o passive suicidal thoughts. She reports no plan and no intention to act on these thoughts. She denies HI/AH/VH.   She reported she was recently taken off of her abilify  and zoloft on the 27th of June due to dystonia (Reported my mouth was twisted side ways). Patient reported since then she has noticed increased suicidal thoughts. She reported she was not placed on any other medications yet. Patient is calm and cooperative during triage and through dress out process.   Patient also reported ongoing nausea and vomiting- she reported she is currently being seen by a GI specialist for these symptoms.

## 2023-11-16 ENCOUNTER — Ambulatory Visit: Payer: Self-pay

## 2023-11-16 ENCOUNTER — Ambulatory Visit: Admitting: Family Medicine

## 2023-11-16 NOTE — Telephone Encounter (Signed)
 Copied from CRM [id number]. Topic: Clinical - Red Word Triage >> Nov 16, 2023  1:26 PM [name] wrote: Red Word that prompted transfer to Nurse Triage: patient states the foot pain started two days and has gotten a lot worse daily the bottom of her foot is swollen tender and very painful and she can't walk on it feeling numbing burning combination of pain  The patient hit the top of her foot going to the bathroom recently but doesn't believe this is related  And she also have rheumatoid arthritis and not sure if this is could be a cause either  Pt num [phone] (M)   FYI Only or Action Required?: FYI only for provider.  Patient was last seen in primary care on 11/03/2023 by Alvia Selinda PARAS, MD. Called Nurse Triage reporting Foot Pain. Symptoms began 2 days ago. Symptoms are: gradually worsening.  Triage Disposition: See Physician Within 24 Hours  Patient/caregiver understands and will follow disposition?: No appointment, going to urgent care   Reason for Disposition  [1] Swollen foot AND [2] no fever  (Exceptions: localized bump from bunions, calluses, insect bite, sting)  Answer Assessment - Initial Assessment Questions 1. ONSET: When did the pain start?      2 days ago  2. LOCATION: Where is the pain located?      Bottom of right foot  3. PAIN: How bad is the pain?    (Scale 1-10; or mild, moderate, severe)  - MILD (1-3): doesn't interfere with normal activities.   - MODERATE (4-7): interferes with normal activities (e.g., work or school) or awakens from sleep, limping.   - SEVERE (8-10): excruciating pain, unable to do any normal activities, unable to walk.      Moderate to severe  4. WORK OR EXERCISE: Has there been any recent work or exercise that involved this part of the body?      No 5. CAUSE: What do you think is causing the foot pain?     Unsure 6. OTHER SYMPTOMS: Do you have any other symptoms? (e.g., leg pain, rash, fever, numbness)     Numbness/burning  sensation intermittently, some swelling to foot  Protocols used: Foot Pain-A-AH

## 2023-11-17 ENCOUNTER — Ambulatory Visit
Admission: EM | Admit: 2023-11-17 | Discharge: 2023-11-17 | Disposition: A | Discharge status: Home / Self Care | Attending: Physician Assistant | Admitting: Physician Assistant

## 2023-11-17 DIAGNOSIS — M79671 Pain in right foot: Secondary | ICD-10-CM | POA: Diagnosis not present

## 2023-11-17 DIAGNOSIS — M722 Plantar fascial fibromatosis: Secondary | ICD-10-CM

## 2023-11-17 MED ORDER — DEXAMETHASONE SODIUM PHOSPHATE 10 MG/ML IJ SOLN
10.0000 mg | Freq: Once | INTRAMUSCULAR | Status: AC
  Administered 2023-11-17: 10 mg via INTRAMUSCULAR

## 2023-11-17 NOTE — ED Triage Notes (Signed)
 Pt is with her husband  Pt c/o right foot problem x4days  Pt states that when she puts pressure on her foot, it is painful with a numb and throbbing sensation.  Pt denies any new physical activities, falls, or bug bites.

## 2023-11-17 NOTE — Discharge Instructions (Addendum)
-  You were given an injection of a corticosteroid -Continue at home diclofenac and may add Tylenol  -Ice foot and elevate them -Wear brace as needed -Stretch foot regularly -Can also try a warm epsom salt foot bath -Follow up with PCP next week if not improving

## 2023-11-17 NOTE — ED Provider Notes (Signed)
 [facility]    CSN: [id number] Arrival date & time: 11/17/23  1019      History   Chief Complaint No chief complaint on file.   HPI Melanie Bell is a 33 y.o. female with a past medical history of PTSD, borderline personality disorder, schizoaffective disorder, migraines, hypertension, hyperlipidemia, chronic knee pain, and rheumatoid arthritis who presents with her partner for 3 day history of right plantar foot pain which is worse with weightbearing. She denies injuries or contusions. Reports the pain feels like a burning sensation. Has been taking diclofenac 75 mg twice daily without relief.  Patient was previously on Lyrica  came off this medication because she is trying to conceive.  HPI  Past Medical History:  Diagnosis Date   Acquired hypothyroidism    Allergy    Anxiety    Arthritis    ASCUS of cervix with negative high risk HPV    Asthma    Depression    Foreign body aspiration    GERD (gastroesophageal reflux disease)    Hallucinations 09/30/2014   Sizoaffective   Hematemesis 01/14/2022   Hyperlipidemia    Hyperprolactinemia (HCC)    Hypertension    Hypoglycemia    Intentional ingestion of batteries 04/21/2016    2 AAA batteries and 1 thumb tack; intent to hurt herself/notes 04/21/2016   Malingering    Migraine without aura and without status migrainosus, not intractable    Peritoneal adhesions without obstruction 02/16/2022   Pharyngoesophageal dysphagia    PTSD (post-traumatic stress disorder)    Rheumatoid factor positive    Schizoaffective disorder, bipolar type (HCC)    Seizure (HCC) 01/16/2023   Self-inflicted injury 11/23/2017   SS-A antibody positive    Suicidal ideation 01/02/2018   Tardive dyskinesia 10/2014   recent onset   Thyroid  disease     Patient Active Problem List   Diagnosis Date Noted   Concussion with no loss of consciousness 11/03/2023   Mild intermittent asthma with exacerbation 06/17/2023   Viral URI with  cough 06/17/2023   Amenorrhea 04/18/2023   Oropharyngeal dysphagia 04/18/2023   Drug overdose, intentional self-harm, initial encounter (HCC) 01/24/2023   Insomnia 01/22/2023   Drug overdose, multiple drugs, intentional self-harm, initial encounter (HCC) 01/21/2023   Mild intermittent asthma 01/21/2023   Seizure (HCC) 01/16/2023   Major depressive disorder, recurrent episode, severe, without psychosis (HCC) 11/11/2022   Intentional overdose (HCC) 11/08/2022   Morbid obesity (HCC) 09/30/2021   Acquired hypothyroidism 06/25/2021   Migraine without aura and without status migrainosus, not intractable 03/05/2021   Mild intermittent asthma without complication 12/01/2020   Gastroesophageal reflux disease 12/01/2020   Hypoglycemia 12/01/2020   Rheumatoid factor positive 12/09/2019   SS-A antibody positive 10/28/2019   Chronic pain of both knees 10/28/2019   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   Schizoaffective disorder, depressive type (HCC) 02/02/2018   Suicide attempt (HCC) 01/16/2018   Borderline personality disorder (HCC) 01/04/2018   PTSD (post-traumatic stress disorder) 01/03/2018   ASCUS of cervix with negative high risk HPV 08/28/2017   Malingering 05/06/2016    Past Surgical History:  Procedure Laterality Date   ABDOMINAL SURGERY     years ago to remove foreign objects   APPENDECTOMY     BREAST EXCISIONAL BIOPSY Right 09/03/2007   surgical bx removed fibroadeoma   BREAST LUMPECTOMY Right    CHOLECYSTECTOMY  02/2022   COLONOSCOPY WITH PROPOFOL  N/A 09/10/2015   Procedure: COLONOSCOPY WITH PROPOFOL ;  Surgeon: Gladis RAYMOND Mariner, MD;  Location: [facility] ENDOSCOPY;  Service:  Endoscopy;  Laterality: N/A;   DIAGNOSTIC LAPAROSCOPY     ESOPHAGOGASTRODUODENOSCOPY N/A 11/28/2014   Procedure: ESOPHAGOGASTRODUODENOSCOPY (EGD);  Surgeon: Lamar ONEIDA Holmes, MD;  Location: [facility] ENDOSCOPY;  Service: Endoscopy;  Laterality: N/A;   ESOPHAGOGASTRODUODENOSCOPY N/A 02/21/2016   Procedure:  ESOPHAGOGASTRODUODENOSCOPY (EGD);  Surgeon: Kavitha V Nandigam, MD;  Location: [facility] ENDOSCOPY;  Service: Endoscopy;  Laterality: N/A;   ESOPHAGOGASTRODUODENOSCOPY N/A 04/21/2016   Procedure: ESOPHAGOGASTRODUODENOSCOPY (EGD);  Surgeon: Lupita FORBES Commander, MD;  Location: [facility] ENDOSCOPY;  Service: Endoscopy;  Laterality: N/A;   ESOPHAGOGASTRODUODENOSCOPY N/A 05/06/2016   Procedure: ESOPHAGOGASTRODUODENOSCOPY (EGD);  Surgeon: Ruel Kung, MD;  Location[facility] ENDOSCOPY;  Service: Gastroenterology;  Laterality: N/A;   ESOPHAGOGASTRODUODENOSCOPY N/A 06/13/2016   Procedure: ESOPHAGOGASTRODUODENOSCOPY (EGD);  Surgeon: Rogelia Copping, MD;  Location: [facility] ENDOSCOPY;  Service: Endoscopy;  Laterality: N/A;   ESOPHAGOGASTRODUODENOSCOPY N/A 01/14/2022   Procedure: ESOPHAGOGASTRODUODENOSCOPY (EGD);  Surgeon: Toledo, Ladell POUR, MD;  Location: [facility] ENDOSCOPY;  Service: Gastroenterology;  Laterality: N/A;   ESOPHAGOGASTRODUODENOSCOPY N/A 09/12/2023   Procedure: EGD (ESOPHAGOGASTRODUODENOSCOPY);  Surgeon: Toledo, Ladell POUR, MD;  Location: [facility] ENDOSCOPY;  Service: Gastroenterology;  Laterality: N/A;   ESOPHAGOGASTRODUODENOSCOPY (EGD) WITH PROPOFOL  N/A 02/29/2016   Procedure: ESOPHAGOGASTRODUODENOSCOPY (EGD) WITH PROPOFOL ;  Surgeon: Rogelia Copping, MD;  Location: [facility] ENDOSCOPY;  Service: Endoscopy;  Laterality: N/A;   ESOPHAGOGASTRODUODENOSCOPY (EGD) WITH PROPOFOL  N/A 01/15/2018   Procedure: ESOPHAGOGASTRODUODENOSCOPY (EGD) WITH PROPOFOL ;  Surgeon: Shaaron Lamar HERO, MD;  Location: [facility];  Service: Endoscopy;  Laterality: N/A;  retrieval forein body   LAPAROSCOPIC LYSIS OF ADHESIONS  02/16/2022   Procedure: LAPAROSCOPIC LYSIS OF ADHESIONS;  Surgeon: Desiderio Schanz, MD;  Location: [facility] ORS;  Service: General;;   LAPAROTOMY N/A 09/12/2015   Procedure: EXPLORATORY LAPAROTOMY;  Surgeon: Charlie FORBES Fell, MD;  Location: [facility] ORS;  Service: General;  Laterality: N/A;   SIGMOIDOSCOPY N/A 09/12/2015   Procedure: [place];   Surgeon: Charlie FORBES Fell, MD;  Location: [facility] ORS;  Service: General;  Laterality: N/A;   WISDOM TOOTH EXTRACTION      OB History     Gravida  5   Para  0   Term  0   Preterm  0   AB  3   Living  0      SAB  3   IAB  0   Ectopic  0   Multiple  0   Live Births               Home Medications    Prior to Admission medications   Medication Sig Start Date End Date Taking? Authorizing Provider  albuterol  (VENTOLIN  HFA) 108 (90 Base) MCG/ACT inhaler 2 puffs four times a day as needed Patient taking differently: Inhale 2 puffs into the lungs every 8 (eight) hours as needed for wheezing or shortness of breath. 12/14/22  Yes Brimage, Vondra, DO  cabergoline  (DOSTINEX ) 0.5 MG tablet Take 1 tablet (0.5 mg total) by mouth once a week. 11/14/22  Yes Cam Charlie Loving, DO  Cyanocobalamin  (VITAMIN B-12 PO) Take 1 capsule by mouth daily.   Yes [provider]  diclofenac (VOLTAREN) 75 MG EC tablet Take 75 mg by mouth 2 (two) times daily. 10/27/23  Yes [provider]  lamoTRIgine  (LAMICTAL ) 25 MG tablet Take 1 tablet (25 mg total) by mouth 2 (two) times daily. 11/14/23 11/13/24 Yes Floy Roberts, MD  levothyroxine  (SYNTHROID ) 75 MCG tablet TAKE ONE TABLET BY MOUTH ONCE EVERY DAY BEFORE BREAKFAST 07/04/23  Yes Shamleffer, Ibtehal Jaralla, MD  Magnesium  500 MG CAPS  Take 500 mg by mouth at bedtime.   Yes [provider]  omeprazole  (PRILOSEC) 20 MG capsule TAKE 1 CAPSULE (20 MG TOTAL) BY MOUTH DAILY. 06/07/22  Yes Justus Leita DEL, MD  ondansetron  (ZOFRAN -ODT) 8 MG disintegrating tablet 1/2- 1 tablet q 8 hr prn nausea, vomiting 11/03/23  Yes Matthews, Jason J, MD  ARIPiprazole  (ABILIFY ) 10 MG tablet Take 10 mg by mouth daily. 07/04/23   [provider]  sertraline (ZOLOFT) 100 MG tablet Take 100 mg by mouth daily. 07/25/23   [provider]  sertraline (ZOLOFT) 50 MG tablet Take 100 mg by mouth daily.    [provider]    Family  History Family History  Problem Relation Age of Onset   Depression Mother    Hypertension Mother    Sleep apnea Mother    Asthma Mother    COPD Mother    Diabetes Mother    Anxiety disorder Mother    Arthritis Mother    Heart failure Mother    Breast cancer Maternal Aunt        20's   Stroke Maternal Grandmother    Heart attack Maternal Grandfather     Social History Social History   Tobacco Use   Smoking status: Former    Current packs/day: 0.00    Average packs/day: 0.5 packs/day for 3.0 years (1.5 ttl pk-yrs)    Types: Cigarettes    Start date: 08/14/2013    Quit date: 08/14/2016    Years since quitting: 7.2    Passive exposure: Past   Smokeless tobacco: Never  Vaping Use   Vaping status: Never Used  Substance Use Topics   Alcohol use: No   Drug use: No     Allergies   Betadine [povidone iodine], Fish allergy, and Shellfish-derived products   Review of Systems Review of Systems  Musculoskeletal:  Positive for arthralgias and joint swelling.  Skin:  Negative for color change and wound.  Neurological:  Positive for numbness. Negative for weakness.     Physical Exam Triage Vital Signs ED Triage Vitals  Encounter Vitals Group     BP      Girls Systolic BP Percentile      Girls Diastolic BP Percentile      Boys Systolic BP Percentile      Boys Diastolic BP Percentile      Pulse      Resp      Temp      Temp src      SpO2      Weight      Height      Head Circumference      Peak Flow      Pain Score      Pain Loc      Pain Education      Exclude from Growth Chart    No data found.  Updated Vital Signs BP (!) 162/141 (BP Location: Left Arm)   Pulse 81   Temp 99.4 F (37.4 C) (Oral)   Ht 5' 7 (1.702 m)   Wt 282 lb (127.9 kg)   LMP 10/23/2023 (Approximate)   SpO2 98%   BMI 44.17 kg/m   Physical Exam Vitals and nursing note reviewed.  Constitutional:      General: She is not in acute distress.    Appearance: Normal appearance. She is  not ill-appearing or toxic-appearing.  HENT:     Head: Normocephalic and atraumatic.  Eyes:     General: No scleral  icterus.       Right eye: No discharge.        Left eye: No discharge.     Conjunctiva/sclera: Conjunctivae normal.  Cardiovascular:     Rate and Rhythm: Normal rate.  Pulmonary:     Effort: Pulmonary effort is normal. No respiratory distress.  Musculoskeletal:     Cervical back: Neck supple.     Comments: RIGHT FOOT: There is mild swelling of the plantar foot. TTP heel and along plantar fascia. Reduced plantar and dorsiflexion of foot due to pain. Good pulses.   Skin:    General: Skin is dry.  Neurological:     General: No focal deficit present.     Mental Status: She is alert. Mental status is at baseline.     Motor: No weakness.     Gait: Gait normal.  Psychiatric:        Mood and Affect: Mood normal.        Behavior: Behavior normal.      UC Treatments / Results  Labs (all labs ordered are listed, but only abnormal results are displayed) Labs Reviewed - No data to display  EKG   Radiology No results found.  Procedures Procedures (including critical care time)  Medications Ordered in UC Medications  dexamethasone  (DECADRON ) injection 10 mg (10 mg Intramuscular Given 11/17/23 1149)    Initial Impression / Assessment and Plan / UC Course  I have reviewed the triage vital signs and the nursing notes.  Pertinent labs & imaging results that were available during my care of the patient were reviewed by me and considered in my medical decision making (see chart for details).   33 y/o female presents for atraumatic right plantar foot pain/swelling for the past 3 days. Has been taking diclofenac 75 mg BID for chronic knee pain and RA.   On evaluation, there is tenderness of the right heel and along the plantar fascia.  Reduced range of motion with plantarflexion and dorsiflexion due to pain and guarding.  Good pulses and strength.  Offered imaging but  explained to patient her symptoms are most consistent with plantar fasciitis.  She elects to hold off on imaging at this time.  Discussed continuing diclofenac and adding Tylenol .  Discussed use of splints and bracing.  Explained she can purchase over-the-counter.  Sent patient is having some financial difficulties she would like a brace here.  Ordered recent ankle/foot brace.  Dexamethasone  10 mg injection offered and given to patient for inflammation.  Discussed multiple stretches and included a handout with stretches for plantar fasciitis.  Encouraged her to follow-up next week as scheduled with PCP.   Final Clinical Impressions(s) / UC Diagnoses   Final diagnoses:  Plantar fasciitis, right  Right foot pain     Discharge Instructions      -You were given an injection of a corticosteroid -Continue at home diclofenac and may add Tylenol  -Ice foot and elevate them -Wear brace as needed -Stretch foot regularly -Can also try a warm epsom salt foot bath -Follow up with PCP next week if not improving      ED Prescriptions   None    I have reviewed the PDMP during this encounter.   Arvis Jolan NOVAK, PA-C 11/17/23 1228

## 2023-11-21 ENCOUNTER — Ambulatory Visit: Admitting: Internal Medicine

## 2023-11-22 ENCOUNTER — Ambulatory Visit: Admitting: Internal Medicine

## 2023-11-23 ENCOUNTER — Other Ambulatory Visit: Payer: Self-pay

## 2023-11-23 DIAGNOSIS — R569 Unspecified convulsions: Secondary | ICD-10-CM

## 2023-11-23 DIAGNOSIS — J4520 Mild intermittent asthma, uncomplicated: Secondary | ICD-10-CM

## 2023-11-23 NOTE — Patient Outreach (Signed)
 Complex Care Management   Visit Note  11/23/2023  Name:  Melanie Bell MRN: [id number] DOB: [birth year]  Situation: Referral received for Complex Care Management related to Asthma I obtained verbal consent from Patient.  Visit completed with Melanie Bell via telephone.  Background:   Past Medical History:  Diagnosis Date   Acquired hypothyroidism    Allergy    Anxiety    Arthritis    ASCUS of cervix with negative high risk HPV    Asthma    Depression    Foreign body aspiration    GERD (gastroesophageal reflux disease)    Hallucinations 09/30/2014   Sizoaffective   Hematemesis 01/14/2022   Hyperlipidemia    Hyperprolactinemia (HCC)    Hypertension    Hypoglycemia    Intentional ingestion of batteries 04/21/2016    2 AAA batteries and 1 thumb tack; intent to hurt herself/notes 04/21/2016   Malingering    Migraine without aura and without status migrainosus, not intractable    Peritoneal adhesions without obstruction 02/16/2022   Pharyngoesophageal dysphagia    PTSD (post-traumatic stress disorder)    Rheumatoid factor positive    Schizoaffective disorder, bipolar type (HCC)    Seizure (HCC) 01/16/2023   Self-inflicted injury 11/23/2017   SS-A antibody positive    Suicidal ideation 01/02/2018   Tardive dyskinesia 10/2014   recent onset   Thyroid  disease     Assessment: Patient Reported Symptoms: Cognitive Cognitive Status: Alert and oriented to person, place, and time, Normal speech and language skills Cognitive/Intellectual Conditions Management [RPT]: None reported or documented in medical history or problem list Health Maintenance Behaviors: Hobbies, Social activities, Spiritual practice(s), Stress management Healing Pattern: Average Health Facilitated by: Rest, Prayer/meditation, Stress management  Neurological Neurological Review of Symptoms: No symptoms reported Neurological Management Strategies: Routine screening Neurological Self-Management Outcome: 4  (good)  HEENT HEENT Symptoms Reported: No symptoms reported HEENT Management Strategies: Routine screening HEENT Self-Management Outcome: 4 (good)  Cardiovascular Cardiovascular Symptoms Reported: No symptoms reported Does patient have uncontrolled Hypertension?: No Cardiovascular Management Strategies: Routine screening Cardiovascular Self-Management Outcome: 4 (good)  Respiratory Respiratory Symptoms Reported: No symptoms reported Respiratory Management Strategies: Medication therapy, Asthma action plan, Routine screening Respiratory Self-Management Outcome: 4 (good)  Endocrine Endocrine Symptoms Reported: No symptoms reported Is patient diabetic?: No  Gastrointestinal Gastrointestinal Symptoms Reported: No symptoms reported Gastrointestinal Self-Management Outcome: 4 (good)  Genitourinary Genitourinary Symptoms Reported: No symptoms reported Genitourinary Self-Management Outcome: 4 (good)  Integumentary Integumentary Symptoms Reported: No symptoms reported Skin Management Strategies: Routine screening Skin Self-Management Outcome: 4 (good)  Musculoskeletal Musculoskelatal Symptoms Reviewed: Other Additional Musculoskeletal Details: Reports occassionally requiring cane or walker due to rheumatoid arthritis and osteoarthritis Musculoskeletal Management Strategies: Medication therapy, Medical device, Routine screening, Coping strategies Musculoskeletal Self-Management Outcome: 4 (good) Falls in the past year?: Yes Number of falls in past year: 1 or less Was there an injury with Fall?: Yes Fall Risk Category Calculator: 2 Patient Fall Risk Level: Moderate Fall Risk Patient at Risk for Falls Due to: Medication side effect, Other (Comment) (Arthritis) Fall risk Follow up: Falls prevention discussed  Psychosocial Psychosocial Symptoms Reported: No symptoms reported Behavioral Management Strategies: Medication therapy, Support system, Counseling, [employer] [facility]  Self-Management Outcome: 4 (good) Behavioral Health Comment: Reports currently being followed by the [facility] ACT Team. Reports symptoms are currently well managed Major Change/Loss/Stressor/Fears (CP): Resources Behaviors When Feeling Stressed/Fearful: Rest or  engage in hobbies  when feeling stressed Techniques to Cope with Loss/Stress/Change: Counseling, Diversional activities, Medication, Spiritual practice(s) (Reports  enjoying crocheting to relieve stress) Quality of Family Relationships: helpful, supportive, involved Do you feel physically threatened by others?: No      11/23/2023   10:47 AM  Depression screen PHQ 2/9  Decreased Interest 0  Down, Depressed, Hopeless 0  PHQ - 2 Score 0  Difficult doing work/chores Not difficult at all    There were no vitals filed for this visit.  Medications Reviewed Today     Reviewed by Karoline Lima, RN (Registered Nurse) on 11/23/23 at [phone]  Med List Status: <None>   Medication Order Taking? Sig Documenting Provider Last Dose Status Informant  albuterol  (VENTOLIN  HFA) 108 (90 Base) MCG/ACT inhaler [id number]  2 puffs four times a day as needed  Patient taking differently: Inhale 2 puffs into the lungs every 8 (eight) hours as needed for wheezing or shortness of breath.   Brimage, Vondra, DO  Active Self, Pharmacy Records  ARIPiprazole  (ABILIFY ) 10 MG tablet [id number]  Take 10 mg by mouth daily. [provider]  Active   cabergoline  (DOSTINEX ) 0.5 MG tablet [id number]  Take 1 tablet (0.5 mg total) by mouth once a week. Cam Charlie Loving, DO  Active Self, Pharmacy Records  Cyanocobalamin  (VITAMIN B-12 PO) [id number]  Take 1 capsule by mouth daily. [provider]  Active Self, Pharmacy Records  diclofenac (VOLTAREN) 75 MG EC tablet [id number]  Take 75 mg by mouth 2 (two) times daily. [provider]  Active   lamoTRIgine  (LAMICTAL ) 25 MG tablet [id number]  Take 1 tablet (25 mg total) by mouth 2 (two) times  daily. Floy Roberts, MD  Active   levothyroxine  (SYNTHROID ) 75 MCG tablet [id number]  TAKE ONE TABLET BY MOUTH ONCE EVERY DAY BEFORE BREAKFAST Shamleffer, Ibtehal Jaralla, MD  Active   Magnesium  500 MG CAPS [id number]  Take 500 mg by mouth at bedtime. [provider]  Active Self, Pharmacy Records  omeprazole  (PRILOSEC) 20 MG capsule [id number]  TAKE 1 CAPSULE (20 MG TOTAL) BY MOUTH DAILY. Justus Leita DEL, MD  Active Pharmacy Records, Self  ondansetron  (ZOFRAN -ODT) 8 MG disintegrating tablet [id number]  1/2- 1 tablet q 8 hr prn nausea, vomiting Matthews, Jason J, MD  Active   sertraline (ZOLOFT) 100 MG tablet [id number]  Take 100 mg by mouth daily. [provider]  Active   sertraline (ZOLOFT) 50 MG tablet [id number]  Take 100 mg by mouth daily. [provider]  Active Self            Recommendation:   PCP Follow-up Continue Current Plan of Care  Follow Up Plan:   Patient will receive ongoing Care Management outreach with [facility] team.   Lima Karoline [facility] Population Health RN Care Manager Direct Dial: [phone]  Fax: [phone] Website: [email or web address]

## 2023-11-24 ENCOUNTER — Other Ambulatory Visit: Payer: Self-pay | Admitting: Internal Medicine

## 2023-11-24 DIAGNOSIS — M722 Plantar fascial fibromatosis: Secondary | ICD-10-CM

## 2023-11-24 NOTE — Patient Instructions (Addendum)
 Thank you for allowing the Complex Care Management team to participate in your care. It was great speaking with you.  Please see the below information regarding [facility].   [email or web address]  If you need help with a mental health or substance use crisis, call the [facility] Crisis Line at [phone]. This line is available 24 hours a day, seven days a week.   If you are experiencing a medical emergency, please call 911 or report to your local emergency department or urgent care.   If you have a non-emergency medical problem during routine business hours, please contact your provider's office and ask to speak with a nurse.    If you would like help to quit smoking, call 1-800-QUIT-NOW (724-667-2942) OR Espaol: 1-855-Djelo-Ya (8-144-664-6430) o para ms informacin haga clic aqu or Text READY to 799-599 to register via text  For more information about eligibility, services, or approvals, always check the [facility] Tailored Plan Member Handbook.    Ms. Macht - following are the goals we discussed in your visit today:    Goals Addressed             This Visit's Progress    [facility] RNCM:  Monitor, Self-Manage And Reduce Symptoms of Asthma       Problems:  Chronic Disease Management support and education needs related to Asthma   Goal: Over the next 3 months the patient will: Attend all scheduled medical appointments Take all medications exactly as prescribed  Demonstrate ongoing adherence to prescribed treatment plan for Asthma Not experience hospital admission for complications related to Asthma   Asthma Interventions (Status: Goal on track  ) Long Term Goal  Reviewed plan for management of Asthma Provided education regarding medications and importance of taking as prescribed. Reports using inhalers as prescribed. Denies current concerns regarding medication management or prescription cost. Reviewed symptoms.  Reports symptoms have been well controlled with current inhalers.  Denies wheezing or shortness of breath. Reports avoiding excessive temperatures and prolonged activity to avoid exacerbation.Reports symptoms have been well controlled. Reviewed current action plan and reinforced importance of daily self-assessment. Advised to make an appointment with provider if experiencing moderate symptoms for greater than 48 hours without improvement.  Provided information regarding infection prevention and increased risk related to Asthma. Advised to utilize prevention strategies to reduce risk of respiratory infection  Reviewed worsening symptoms that require immediate medical attention.    Patient Self-Care Activities:  Complete appointments with the medical team as scheduled Take medications and use inhalers as prescribed Assess symptoms daily Notify provider if in the yellow zone for 48 hrs without improvement Follow recommendations to prevent respiratory infection Notify provider or care management team with questions and new concerns as needed   Plan:  Patient will follow up with [facility] Care Coordinator                 Please see education materials related to Heat Exhaustion and Asthma Action Plan and [facility] information provided by MyChart link.  Patient verbalizes understanding of instructions and care plan provided today and agrees to view in MyChart. Active MyChart status and patient understanding of how to access instructions and care plan via MyChart confirmed with patient.     Patient will receive ongoing Complex Care Management via [facility] care management team. RNCM will follow up with [facility] team on November 27, 2023 to confirm successful transition to [facility] Care Manager  Jackson Acron [facility] RN Care Manager  Direct Dial: [phone]  Fax: [phone] Website: [email or web address]

## 2023-11-24 NOTE — Progress Notes (Unsigned)
 Date:  11/24/2023   Name:  Melanie Bell   DOB:  14-Aug-1990   MRN:  [id number]   Chief Complaint: No chief complaint on file.  HPI  Review of Systems   Lab Results  Component Value Date   NA 140 11/14/2023   K 3.9 11/14/2023   CO2 26 11/14/2023   GLUCOSE 110 (H) 11/14/2023   BUN 13 11/14/2023   CREATININE 0.67 11/14/2023   CALCIUM 9.1 11/14/2023   EGFR 113 04/18/2023   GFRNONAA >60 11/14/2023   Lab Results  Component Value Date   CHOL 172 07/28/2023   HDL 49 07/28/2023   LDLCALC 109 (H) 07/28/2023   TRIG 72 07/28/2023   CHOLHDL 3.5 07/28/2023   Lab Results  Component Value Date   TSH 0.770 07/28/2023   Lab Results  Component Value Date   HGBA1C 5.3 07/28/2023   Lab Results  Component Value Date   WBC 9.2 11/14/2023   WBC 9.2 11/14/2023   HGB 13.8 11/14/2023   HGB 13.8 11/14/2023   HCT 41.7 11/14/2023   HCT 42.2 11/14/2023   MCV 93.3 11/14/2023   MCV 93.2 11/14/2023   PLT 192 11/14/2023   PLT 192 11/14/2023   Lab Results  Component Value Date   ALT 13 11/14/2023   AST 18 11/14/2023   ALKPHOS 52 11/14/2023   BILITOT 1.0 11/14/2023   No results found for: MARIEN BOLLS, VD25OH   Patient Active Problem List   Diagnosis Date Noted   Concussion with no loss of consciousness 11/03/2023   Mild intermittent asthma with exacerbation 06/17/2023   Viral URI with cough 06/17/2023   Amenorrhea 04/18/2023   Oropharyngeal dysphagia 04/18/2023   Drug overdose, intentional self-harm, initial encounter (HCC) 01/24/2023   Insomnia 01/22/2023   Drug overdose, multiple drugs, intentional self-harm, initial encounter (HCC) 01/21/2023   Mild intermittent asthma 01/21/2023   Seizure (HCC) 01/16/2023   Major depressive disorder, recurrent episode, severe, without psychosis (HCC) 11/11/2022   Intentional overdose (HCC) 11/08/2022   Morbid obesity (HCC) 09/30/2021   Acquired hypothyroidism 06/25/2021   Migraine without aura and without status  migrainosus, not intractable 03/05/2021   Mild intermittent asthma without complication 12/01/2020   Gastroesophageal reflux disease 12/01/2020   Hypoglycemia 12/01/2020   Rheumatoid factor positive 12/09/2019   SS-A antibody positive 10/28/2019   Chronic pain of both knees 10/28/2019   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   Schizoaffective disorder, depressive type (HCC) 02/02/2018   Suicide attempt (HCC) 01/16/2018   Borderline personality disorder (HCC) 01/04/2018   PTSD (post-traumatic stress disorder) 01/03/2018   ASCUS of cervix with negative high risk HPV 08/28/2017   Malingering 05/06/2016    Allergies  Allergen Reactions   Abilify  [Aripiprazole ] Other (See Comments)    Reports dystonic reaction to Ability   Betadine [Povidone Iodine] Hives   Fish Allergy Hives   Shellfish-Derived Products Hives    Past Surgical History:  Procedure Laterality Date   ABDOMINAL SURGERY     years ago to remove foreign objects   APPENDECTOMY     BREAST EXCISIONAL BIOPSY Right 09/03/2007   surgical bx removed fibroadeoma   BREAST LUMPECTOMY Right    CHOLECYSTECTOMY  02/2022   COLONOSCOPY WITH PROPOFOL  N/A 09/10/2015   Procedure: COLONOSCOPY WITH PROPOFOL ;  Surgeon: Gladis RAYMOND Mariner, MD;  Location: [facility] ENDOSCOPY;  Service: Endoscopy;  Laterality: N/A;   DIAGNOSTIC LAPAROSCOPY     ESOPHAGOGASTRODUODENOSCOPY N/A 11/28/2014   Procedure: ESOPHAGOGASTRODUODENOSCOPY (EGD);  Surgeon: Lamar ONEIDA Holmes, MD;  Location: [facility]  ENDOSCOPY;  Service: Endoscopy;  Laterality: N/A;   ESOPHAGOGASTRODUODENOSCOPY N/A 02/21/2016   Procedure: ESOPHAGOGASTRODUODENOSCOPY (EGD);  Surgeon: Kavitha V Nandigam, MD;  Location: [facility] ENDOSCOPY;  Service: Endoscopy;  Laterality: N/A;   ESOPHAGOGASTRODUODENOSCOPY N/A 04/21/2016   Procedure: ESOPHAGOGASTRODUODENOSCOPY (EGD);  Surgeon: Lupita FORBES Commander, MD;  Location: [facility] ENDOSCOPY;  Service: Endoscopy;  Laterality: N/A;   ESOPHAGOGASTRODUODENOSCOPY N/A 05/06/2016    Procedure: ESOPHAGOGASTRODUODENOSCOPY (EGD);  Surgeon: Ruel Kung, MD;  Location: [facility] ENDOSCOPY;  Service: Gastroenterology;  Laterality: N/A;   ESOPHAGOGASTRODUODENOSCOPY N/A 06/13/2016   Procedure: ESOPHAGOGASTRODUODENOSCOPY (EGD);  Surgeon: Rogelia Copping, MD;  Location[facility] ENDOSCOPY;  Service: Endoscopy;  Laterality: N/A;   ESOPHAGOGASTRODUODENOSCOPY N/A 01/14/2022   Procedure: ESOPHAGOGASTRODUODENOSCOPY (EGD);  Surgeon: Toledo, Ladell POUR, MD;  Location: [facility] ENDOSCOPY;  Service: Gastroenterology;  Laterality: N/A;   ESOPHAGOGASTRODUODENOSCOPY N/A 09/12/2023   Procedure: EGD (ESOPHAGOGASTRODUODENOSCOPY);  Surgeon: Toledo, Ladell POUR, MD;  Location: [facility] ENDOSCOPY;  Service: Gastroenterology;  Laterality: N/A;   ESOPHAGOGASTRODUODENOSCOPY (EGD) WITH PROPOFOL  N/A 02/29/2016   Procedure: ESOPHAGOGASTRODUODENOSCOPY (EGD) WITH PROPOFOL ;  Surgeon: Rogelia Copping, MD;  Location: [facility] ENDOSCOPY;  Service: Endoscopy;  Laterality: N/A;   ESOPHAGOGASTRODUODENOSCOPY (EGD) WITH PROPOFOL  N/A 01/15/2018   Procedure: ESOPHAGOGASTRODUODENOSCOPY (EGD) WITH PROPOFOL ;  Surgeon: Shaaron Lamar HERO, MD;  [facility] ENDO [facility];  Service: Endoscopy;  Laterality: N/A;  retrieval forein body   LAPAROSCOPIC LYSIS OF ADHESIONS  02/16/2022   Procedure: LAPAROSCOPIC LYSIS OF ADHESIONS;  Surgeon: Desiderio Schanz, MD;  Location: [facility] ORS;  Service: General;;   LAPAROTOMY N/A 09/12/2015   Procedure: EXPLORATORY LAPAROTOMY;  Surgeon: Charlie FORBES Fell, MD;  Location: [facility] ORS;  Service: General;  Laterality: N/A;   SIGMOIDOSCOPY N/A 09/12/2015   Procedure: [place];  Surgeon: Charlie FORBES Fell, MD;  Location: [facility] ORS;  Service: General;  Laterality: N/A;   WISDOM TOOTH EXTRACTION      Social History   Tobacco Use   Smoking status: Former    Current packs/day: 0.00    Average packs/day: 0.5 packs/day for 3.0 years (1.5 ttl pk-yrs)    Types: Cigarettes    Start date: 08/14/2013    Quit date: 08/14/2016    Years since quitting:  7.2    Passive exposure: Past   Smokeless tobacco: Never  Vaping Use   Vaping status: Never Used  Substance Use Topics   Alcohol use: No   Drug use: No     Medication list has been reviewed and updated.  No outpatient medications have been marked as taking for the 11/24/23 encounter (Orders Only) with Justus Leita DEL, MD.       11/23/2023   10:47 AM 11/03/2023   10:49 AM 04/18/2023    9:14 AM 06/20/2022    1:51 PM  GAD 7 : Generalized Anxiety Score  Nervous, Anxious, on Edge 0 0 0 1  Control/stop worrying 0 0 0 0  Worry too much - different things 0 0 0 0  Trouble relaxing 0 0 0 0  Restless 0 0 0 0  Easily annoyed or irritable 0 1 0 0  Afraid - awful might happen 0 0 0 0  Total GAD 7 Score 0 1 0 1  Anxiety Difficulty Not difficult at all Not difficult at all Not difficult at all Not difficult at all       11/23/2023   10:47 AM 11/23/2023   10:35 AM 11/03/2023   10:48 AM  Depression screen PHQ 2/9  Decreased Interest 0 0 0  Down, Depressed, Hopeless 0 0 0  PHQ -  2 Score 0 0 0  Altered sleeping   2  Tired, decreased energy   1  Change in appetite   0  Feeling bad or failure about yourself    0  Trouble concentrating   1  Moving slowly or fidgety/restless   0  Suicidal thoughts   0  PHQ-9 Score   4  Difficult doing work/chores Not difficult at all  Not difficult at all    BP Readings from Last 3 Encounters:  11/17/23 (!) 162/141  11/14/23 103/67  11/10/23 137/84    Physical Exam  Wt Readings from Last 3 Encounters:  11/17/23 282 lb (127.9 kg)  11/14/23 288 lb (130.6 kg)  11/10/23 288 lb (130.6 kg)    LMP 10/23/2023 (Approximate)   Assessment and Plan:  Problem List Items Addressed This Visit   None   No follow-ups on file.    Leita HILARIO Adie, MD [facility] Primary [facility] and Sports Medicine [place]

## 2023-11-27 ENCOUNTER — Telehealth: Payer: Self-pay

## 2023-11-27 NOTE — Progress Notes (Signed)
   Telephone encounter was:  Unsuccessful.  11/27/2023 Name: Melinda Gwinner MRN: [id number] DOB: June 06, 1990  Unsuccessful outbound call made today to assist with:  Transportation Needs , Food Insecurity, and Financial Difficulties related to financial strain  Outreach Attempt:  1st Attempt  No answer and unable to leave a message    Jon Colt [facility]  [facility] Guide, Phone: [phone] Fax: [phone] Website: [email or web address]

## 2023-11-27 NOTE — Patient Outreach (Signed)
 Error

## 2023-11-29 ENCOUNTER — Telehealth: Payer: Self-pay

## 2023-11-29 NOTE — Progress Notes (Signed)
   Telephone encounter was:  Successful.  Complex Care Management Note Care Guide Note  11/29/2023 [name] MRN: [id number] DOB: 09-06-1990  Melanie Bell is a 33 y.o. year old female who is a primary care patient of Justus Leita DEL, MD . The community resource team was consulted for assistance with Transportation Needs , Food Insecurity, and Financial Difficulties related to financial strain  SDOH screenings and interventions completed:  Yes  Social Drivers of Health From This Encounter   Food Insecurity: Food Insecurity Present (11/29/2023)   Hunger Vital Sign    Worried About Running Out of Food in the Last Year: Often true    Ran Out of Food in the Last Year: Often true  Financial Resource Strain: High Risk (11/29/2023)   Overall Financial Resource Strain (CARDIA)    Difficulty of Paying Living Expenses: Very hard  Utilities: At Risk (11/29/2023)   Utilities    Threatened with loss of utilities: Yes    SDOH Interventions Today    Flowsheet Row Most Recent Value  SDOH Interventions   Food Insecurity Interventions Community Resources Provided  Utilities Interventions Community Resources Provided  Financial Strain Interventions Community Resources Provided     Care guide performed the following interventions: Patient provided with information about care guide support team and interviewed to confirm resource needs.Pt is having financial stran due to reduced income. Patient is in need of food financial and transportation assistance. Pt requested I mail [employer]. Online resources were given over the phone as well.   Follow Up Plan:  No further follow up planned at this time. The patient has been provided with needed resources.  Encounter Outcome:  Patient Visit Completed    Jon Colt [facility]  [facility] Guide, Phone: [phone] Fax: [phone] Website: [email or web address]

## 2023-12-01 ENCOUNTER — Emergency Department
Admission: EM | Admit: 2023-12-01 | Discharge: 2023-12-01 | Disposition: A | Discharge status: Home / Self Care | Attending: Emergency Medicine | Admitting: Emergency Medicine

## 2023-12-01 ENCOUNTER — Other Ambulatory Visit: Payer: Self-pay

## 2023-12-01 DIAGNOSIS — T7840XA Allergy, unspecified, initial encounter: Secondary | ICD-10-CM | POA: Insufficient documentation

## 2023-12-01 MED ORDER — PREDNISONE 10 MG (21) PO TBPK: ORAL_TABLET | ORAL | 0 refills | Status: DC

## 2023-12-01 NOTE — ED Triage Notes (Signed)
 See first nurse note.

## 2023-12-01 NOTE — ED Notes (Signed)
 See triage note  Presents with rash  States she started Lamictal  2 days ago  Now has a rash   No resp issues noted

## 2023-12-01 NOTE — ED Triage Notes (Signed)
 First nurse note: Pt here via [facility] home with c/o of allergic reaction Lamictal  that she started 3 weeks ago, pt has taken multiple doses and states last 2 days she c/o of rash under breast.   25mg  oral Benadryl  PTA   98% RA  145/79 HR: 85

## 2023-12-01 NOTE — Discharge Instructions (Signed)
 Stop the Lamictal .  Take steroid.  Also take Benadryl .  Return emergency department for worsening.  Call your doctor and let her know that we stopped the Lamictal  due to the rash.

## 2023-12-01 NOTE — ED Provider Notes (Signed)
 [facility] Provider Note    Event Date/Time   First MD Initiated Contact with Patient 12/01/23 1424     (approximate)   History   Allergic Reaction   HPI  Melanie Bell is a 33 y.o. female presents emergency department states of allergic reaction to Lamictal .  Patient noticed blisters underneath her tongue, and rash under her breast.  States she was on Lamictal  last week and recently increased the dose.  States this is week 3.  No chest pain shortness of breath.  No swelling in the mouth.  Areas are not painful.      Physical Exam   Triage Vital Signs: ED Triage Vitals  Encounter Vitals Group     BP 12/01/23 1250 117/88     Girls Systolic BP Percentile --      Girls Diastolic BP Percentile --      Boys Systolic BP Percentile --      Boys Diastolic BP Percentile --      Pulse Rate 12/01/23 1250 87     Resp 12/01/23 1250 18     Temp 12/01/23 1250 97.8 F (36.6 C)     Temp Source 12/01/23 1250 Oral     SpO2 12/01/23 1250 98 %     Weight 12/01/23 1249 287 lb (130.2 kg)     Height 12/01/23 1249 5' 7 (1.702 m)     Head Circumference --      Peak Flow --      Pain Score 12/01/23 1249 0     Pain Loc --      Pain Education --      Exclude from Growth Chart --     Most recent vital signs: Vitals:   12/01/23 1250  BP: 117/88  Pulse: 87  Resp: 18  Temp: 97.8 F (36.6 C)  SpO2: 98%     General: Awake, no distress.   CV:  Good peripheral perfusion. regular rate and  rhythm Resp:  Normal effort. Lungs cta Abd:  No distention.   Other:  Skin with hives noted under the left breast, 1 on the left arm, no ulceration/blister noted underneath the tongue, no blisters around the mouth, neurovascular intact   ED Results / Procedures / Treatments   Labs (all labs ordered are listed, but only abnormal results are displayed) Labs Reviewed - No data to display   EKG     RADIOLOGY     PROCEDURES:   Procedures  Critical Care:   no Chief Complaint  Patient presents with   Allergic Reaction      MEDICATIONS ORDERED IN ED: Medications - No data to display   IMPRESSION / MDM / ASSESSMENT AND PLAN / ED COURSE  I reviewed the triage vital signs and the nursing notes.                              Differential diagnosis includes, but is not limited to, medication reaction, hives, cellulitis  Patient's presentation is most consistent with acute illness / injury with system symptoms.   Patient's symptoms seem to correlate with the increase in her dose of Lamictal .  Therefore we will go ahead and stop this medication and have her follow-up with her doctor.  Steroid pack provided.  She is to also take Benadryl .  Do not feel that she needs epinephrine  etc. as she has no swelling in the mouth or difficulty breathing.  The patient  is in agreement treatment plan.  Discharged stable condition.  Strict instructions to return if worsening      FINAL CLINICAL IMPRESSION(S) / ED DIAGNOSES   Final diagnoses:  Allergic reaction, initial encounter     Rx / DC Orders   ED Discharge Orders          Ordered    predniSONE  (STERAPRED UNI-PAK 21 TAB) 10 MG (21) TBPK tablet        12/01/23 1443             Note:  This document was prepared using Dragon voice recognition software and may include unintentional dictation errors.    Gasper Devere ORN, PA-C 12/01/23 1449    Claudene Rover, MD 12/01/23 [phone]

## 2023-12-04 ENCOUNTER — Other Ambulatory Visit: Payer: Self-pay

## 2023-12-04 ENCOUNTER — Ambulatory Visit (INDEPENDENT_AMBULATORY_CARE_PROVIDER_SITE_OTHER)

## 2023-12-04 ENCOUNTER — Ambulatory Visit: Admitting: Podiatry

## 2023-12-04 VITALS — Ht 67.0 in | Wt 287.0 lb

## 2023-12-04 DIAGNOSIS — M722 Plantar fascial fibromatosis: Secondary | ICD-10-CM

## 2023-12-04 DIAGNOSIS — M7731 Calcaneal spur, right foot: Secondary | ICD-10-CM

## 2023-12-04 DIAGNOSIS — M7741 Metatarsalgia, right foot: Secondary | ICD-10-CM

## 2023-12-04 NOTE — Patient Instructions (Signed)
 VISIT SUMMARY: During your visit, we discussed your heel and foot pain, which has been affecting your ability to put pressure on your foot. We reviewed your current medications and recent treatments, including the brace and Toradol  shot you received at urgent care. We also examined your x-rays and discussed the findings.  YOUR PLAN: -PLANTAR FASCIITIS: Plantar fasciitis is inflammation of the thick band of tissue that runs across the bottom of your foot and connects your heel bone to your toes. We will prescribe a walking boot to help immobilize and rest your foot. You should avoid driving while wearing the boot. After one week of rest, you can start home physical therapy exercises. We will evaluate your progress in one month and may consider a corticosteroid injection if there is no improvement. Most cases do not require surgery or procedures.  -HEEL SPUR: A heel spur is a bony growth on the underside of the heel bone. It was noted on your x-ray but is not the primary cause of your current pain and is not a significant concern at this time.  -OSTEOARTHRITIS: Osteoarthritis is a condition where the protective cartilage that cushions the ends of your bones wears down over time. You are currently managing this with Voltaren (diclofenac).  -RHEUMATOID ARTHRITIS: Rheumatoid arthritis is an autoimmune disorder that causes chronic inflammation of your joints. You are currently managing this with Plaquenil (hydroxychloroquine).  INSTRUCTIONS: Please schedule a follow-up appointment in one month to assess your progress and determine if further intervention is needed. Avoid driving while wearing the walking boot. Start home physical therapy exercises after one week of rest.         Plantar Fasciitis (Heel Spur Syndrome) with Rehab The plantar fascia is a fibrous, ligament-like, soft-tissue structure that spans the bottom of the foot. Plantar fasciitis is a condition that causes pain in the foot due  to inflammation of the tissue. SYMPTOMS  Pain and tenderness on the underneath side of the foot. Pain that worsens with standing or walking. CAUSES  Plantar fasciitis is caused by irritation and injury to the plantar fascia on the underneath side of the foot. Common mechanisms of injury include: Direct trauma to bottom of the foot. Damage to a small nerve that runs under the foot where the main fascia attaches to the heel bone. Stress placed on the plantar fascia due to bone spurs. RISK INCREASES WITH:  Activities that place stress on the plantar fascia (running, jumping, pivoting, or cutting). Poor strength and flexibility. Improperly fitted shoes. Tight calf muscles. Flat feet. Failure to warm-up properly before activity. Obesity. PREVENTION Warm up and stretch properly before activity. Allow for adequate recovery between workouts. Maintain physical fitness: Strength, flexibility, and endurance. Cardiovascular fitness. Maintain a health body weight. Avoid stress on the plantar fascia. Wear properly fitted shoes, including arch supports for individuals who have flat feet.  PROGNOSIS  If treated properly, then the symptoms of plantar fasciitis usually resolve without surgery. However, occasionally surgery is necessary.  RELATED COMPLICATIONS  Recurrent symptoms that may result in a chronic condition. Problems of the lower back that are caused by compensating for the injury, such as limping. Pain or weakness of the foot during push-off following surgery. Chronic inflammation, scarring, and partial or complete fascia tear, occurring more often from repeated injections.  TREATMENT  Treatment initially involves the use of ice and medication to help reduce pain and inflammation. The use of strengthening and stretching exercises may help reduce pain with activity, especially stretches of the Achilles  tendon. These exercises may be performed at home or with a therapist. Your caregiver  may recommend that you use heel cups of arch supports to help reduce stress on the plantar fascia. Occasionally, corticosteroid injections are given to reduce inflammation. If symptoms persist for greater than 6 months despite non-surgical (conservative), then surgery may be recommended.   MEDICATION  If pain medication is necessary, then nonsteroidal anti-inflammatory medications, such as aspirin and ibuprofen , or other minor pain relievers, such as acetaminophen , are often recommended. Do not take pain medication within 7 days before surgery. Prescription pain relievers may be given if deemed necessary by your caregiver. Use only as directed and only as much as you need. Corticosteroid injections may be given by your caregiver. These injections should be reserved for the most serious cases, because they may only be given a certain number of times.  HEAT AND COLD Cold treatment (icing) relieves pain and reduces inflammation. Cold treatment should be applied for 10 to 15 minutes every 2 to 3 hours for inflammation and pain and immediately after any activity that aggravates your symptoms. Use ice packs or massage the area with a piece of ice (ice massage). Heat treatment may be used prior to performing the stretching and strengthening activities prescribed by your caregiver, physical therapist, or athletic trainer. Use a heat pack or soak the injury in warm water .  SEEK IMMEDIATE MEDICAL CARE IF: Treatment seems to offer no benefit, or the condition worsens. Any medications produce adverse side effects.  EXERCISES- RANGE OF MOTION (ROM) AND STRETCHING EXERCISES - Plantar Fasciitis (Heel Spur Syndrome) These exercises may help you when beginning to rehabilitate your injury. Your symptoms may resolve with or without further involvement from your physician, physical therapist or athletic trainer. While completing these exercises, remember:  Restoring tissue flexibility helps normal motion to return to  the joints. This allows healthier, less painful movement and activity. An effective stretch should be held for at least 30 seconds. A stretch should never be painful. You should only feel a gentle lengthening or release in the stretched tissue.  RANGE OF MOTION - Toe Extension, Flexion Sit with your right / left leg crossed over your opposite knee. Grasp your toes and gently pull them back toward the top of your foot. You should feel a stretch on the bottom of your toes and/or foot. Hold this stretch for 10 seconds. Now, gently pull your toes toward the bottom of your foot. You should feel a stretch on the top of your toes and or foot. Hold this stretch for 10 seconds. Repeat  times. Complete this stretch 3 times per day.   RANGE OF MOTION - Ankle Dorsiflexion, Active Assisted Remove shoes and sit on a chair that is preferably not on a carpeted surface. Place right / left foot under knee. Extend your opposite leg for support. Keeping your heel down, slide your right / left foot back toward the chair until you feel a stretch at your ankle or calf. If you do not feel a stretch, slide your bottom forward to the edge of the chair, while still keeping your heel down. Hold this stretch for 10 seconds. Repeat 3 times. Complete this stretch 2 times per day.   STRETCH  Gastroc, Standing Place hands on wall. Extend right / left leg, keeping the front knee somewhat bent. Slightly point your toes inward on your back foot. Keeping your right / left heel on the floor and your knee straight, shift your weight toward the wall,  not allowing your back to arch. You should feel a gentle stretch in the right / left calf. Hold this position for 10 seconds. Repeat 3 times. Complete this stretch 2 times per day.  STRETCH  Soleus, Standing Place hands on wall. Extend right / left leg, keeping the other knee somewhat bent. Slightly point your toes inward on your back foot. Keep your right / left heel on the  floor, bend your back knee, and slightly shift your weight over the back leg so that you feel a gentle stretch deep in your back calf. Hold this position for 10 seconds. Repeat 3 times. Complete this stretch 2 times per day.  STRETCH  Gastrocsoleus, Standing  Note: This exercise can place a lot of stress on your foot and ankle. Please complete this exercise only if specifically instructed by your caregiver.  Place the ball of your right / left foot on a step, keeping your other foot firmly on the same step. Hold on to the wall or a rail for balance. Slowly lift your other foot, allowing your body weight to press your heel down over the edge of the step. You should feel a stretch in your right / left calf. Hold this position for 10 seconds. Repeat this exercise with a slight bend in your right / left knee. Repeat 3 times. Complete this stretch 2 times per day.   STRENGTHENING EXERCISES - Plantar Fasciitis (Heel Spur Syndrome)  These exercises may help you when beginning to rehabilitate your injury. They may resolve your symptoms with or without further involvement from your physician, physical therapist or athletic trainer. While completing these exercises, remember:  Muscles can gain both the endurance and the strength needed for everyday activities through controlled exercises. Complete these exercises as instructed by your physician, physical therapist or athletic trainer. Progress the resistance and repetitions only as guided.  STRENGTH - Towel Curls Sit in a chair positioned on a non-carpeted surface. Place your foot on a towel, keeping your heel on the floor. Pull the towel toward your heel by only curling your toes. Keep your heel on the floor. Repeat 3 times. Complete this exercise 2 times per day.  STRENGTH - Ankle Inversion Secure one end of a rubber exercise band/tubing to a fixed object (table, pole). Loop the other end around your foot just before your toes. Place your fists  between your knees. This will focus your strengthening at your ankle. Slowly, pull your big toe up and in, making sure the band/tubing is positioned to resist the entire motion. Hold this position for 10 seconds. Have your muscles resist the band/tubing as it slowly pulls your foot back to the starting position. Repeat 3 times. Complete this exercises 2 times per day.  Document Released: 05/02/2005 Document Revised: 07/25/2011 Document Reviewed: 08/14/2008 [facility] Patient Information 2014 [place], [place].

## 2023-12-04 NOTE — Patient Instructions (Signed)
 Visit Information  Tailored Plan Medicaid On November 14, 2022 some people on [place] Medicaid will move to a new kind of Medicaid health plan called a Tailored Plan. Tailored Plans cover your doctor visits, prescription drugs, and health care services.    If your [place] Medicaid will move to a Tailored Plan, you should have gotten a letter and welcome packet. If you're not sure, call your [place] Medicaid Enrollment Broker at [phone] and ask.  Check out these free materials, in [place] and [place], to learn more about your Tailored Plan: [email or web address]/Tailored-Plans/Toolkit  Tailored Care Management Services  TCM services are available to you now. If you are a Tailored Plan member or will be and want information about Tailored Care Management Services including rides to appointments and community and home services, call the Care Management provider for your county of residence:    [facility] [place], [place])  Member Services: [phone] Behavioral Health Crisis Line: [phone], [place], [place], [place], [place])  Member Services: [phone] Behavioral Health Crisis Line: [phone]     Please call 911 if you are experiencing a Mental Health or Behavioral Health Crisis or need someone to talk to.  Patient verbalizes understanding of instructions and care plan provided today and agrees to view in MyChart. Active MyChart status and patient understanding of how to access instructions and care plan via MyChart confirmed with patient.       [facility] Population Health RN Care Manager Direct Dial: [phone]  Fax: [phone] Website: [email or web address]

## 2023-12-04 NOTE — Patient Outreach (Unsigned)
 Complex Care Management   Visit Note  12/04/2023  Name:  Melanie Bell MRN: [id number] DOB: 1991-05-14  Situation: Referral received for Complex Care Management related to {Criteria:32550} I obtained verbal consent from {CHL AMB Patient/Caregiver:28184}.  Visit completed with ***  {VISIT LOCATION:32553}  Background:   Past Medical History:  Diagnosis Date   Acquired hypothyroidism    Allergy    Anxiety    Arthritis    ASCUS of cervix with negative high risk HPV    Asthma    Depression    Foreign body aspiration    GERD (gastroesophageal reflux disease)    Hallucinations 09/30/2014   Sizoaffective   Hematemesis 01/14/2022   Hyperlipidemia    Hyperprolactinemia (HCC)    Hypertension    Hypoglycemia    Intentional ingestion of batteries 04/21/2016    2 AAA batteries and 1 thumb tack; intent to hurt herself/notes 04/21/2016   Malingering    Migraine without aura and without status migrainosus, not intractable    Peritoneal adhesions without obstruction 02/16/2022   Pharyngoesophageal dysphagia    PTSD (post-traumatic stress disorder)    Rheumatoid factor positive    Schizoaffective disorder, bipolar type (HCC)    Seizure (HCC) 01/16/2023   Self-inflicted injury 11/23/2017   SS-A antibody positive    Suicidal ideation 01/02/2018   Tardive dyskinesia 10/2014   recent onset   Thyroid  disease     Assessment: Patient Reported Symptoms:  Cognitive        Neurological      HEENT        Cardiovascular      Respiratory      Endocrine      Gastrointestinal        Genitourinary      Integumentary      Musculoskeletal          Psychosocial              11/23/2023   10:47 AM  Depression screen PHQ 2/9  Decreased Interest 0  Down, Depressed, Hopeless 0  PHQ - 2 Score 0  Difficult doing work/chores Not difficult at all    There were no vitals filed for this visit.  Medications Reviewed Today     Reviewed by Karoline Lima, RN (Registered  Nurse) on 12/04/23 at 2345  Med List Status: <None>   Medication Order Taking? Sig Documenting Provider Last Dose Status Informant  albuterol  (VENTOLIN  HFA) 108 (90 Base) MCG/ACT inhaler [id number]  2 puffs four times a day as needed  Patient taking differently: Inhale 2 puffs into the lungs every 8 (eight) hours as needed for wheezing or shortness of breath.   Brimage, Vondra, DO  Active Self, Pharmacy Records  ARIPiprazole  (ABILIFY ) 10 MG tablet [id number]  Take 10 mg by mouth daily.  Patient not taking: Reported on 11/23/2023   [provider]  Active   cabergoline  (DOSTINEX ) 0.5 MG tablet [id number]  Take 1 tablet (0.5 mg total) by mouth once a week. Cam Charlie Loving, DO  Active Self, Pharmacy Records  Cyanocobalamin  (VITAMIN B-12 PO) [id number]  Take 1 capsule by mouth daily. [provider]  Active Self, Pharmacy Records  diclofenac (VOLTAREN) 75 MG EC tablet [id number]  Take 75 mg by mouth 2 (two) times daily. [provider]  Active   hydroxychloroquine (PLAQUENIL) 200 MG tablet [id number]  200 mg 2 (two) times daily. [provider]  Active   lamoTRIgine  (LAMICTAL ) 25 MG tablet [id number] No Take 1 tablet (25 mg total) by  mouth 2 (two) times daily. Floy Roberts, MD 11/17/2023 Active   levothyroxine  (SYNTHROID ) 75 MCG tablet [id number]  TAKE ONE TABLET BY MOUTH ONCE EVERY DAY BEFORE BREAKFAST Shamleffer, Ibtehal Jaralla, MD  Active   Magnesium  500 MG CAPS [id number]  Take 500 mg by mouth at bedtime. [provider]  Active Self, Pharmacy Records  omeprazole  (PRILOSEC) 20 MG capsule [id number]  TAKE 1 CAPSULE (20 MG TOTAL) BY MOUTH DAILY. Justus Leita DEL, MD  Active Pharmacy Records, Self  ondansetron  (ZOFRAN -ODT) 8 MG disintegrating tablet [id number]  1/2- 1 tablet q 8 hr prn nausea, vomiting Matthews, Jason J, MD  Active   predniSONE  (STERAPRED UNI-PAK 21 TAB) 10 MG (21) TBPK tablet [id number]  Take 6 pills on day one then decrease by 1 pill each  day Gasper Devere ORN, PA-C  Active   sertraline (ZOLOFT) 100 MG tablet [id number]  Take 100 mg by mouth daily. [provider]  Active   sertraline (ZOLOFT) 50 MG tablet [id number]  Take 100 mg by mouth daily. [provider]  Active Self            Recommendation:   {RECOMMENDATONS:32554}  Follow Up Plan:   {FOLLOWUP:32559}  SIG ***

## 2023-12-04 NOTE — Progress Notes (Signed)
 Subjective:  Patient ID: Melanie Bell, female    DOB: Dec 23, 1990,  MRN: [id number]  Chief Complaint  Patient presents with   Plantar Fasciitis    RM 7 NP - Patient is here for possible Plantar fasciitis of the right foot. Pain has been present for the last 3 weeks, primarily located in the right heel and radiates towards the ball of the foot. Patient is taking otc medication and using hot/cold therapy for relief.    Discussed the use of AI scribe software for clinical note transcription with the patient, who gave verbal consent to proceed.  History of Present Illness Melanie Bell is a 33 year old female with osteoarthritis and rheumatoid arthritis who presents with heel and foot pain.  She experiences heel pain that extends into the arch and the ball of her foot, near her toes. The pain began approximately four days before she visited urgent care on November 17, 2023. The pain makes it difficult to put pressure on her foot. No pain in the back of her foot, but discomfort radiates downwards.  At urgent care, she received a brace and a shot of Toradol . She is currently taking Voltaren pills for her osteoarthritis and rheumatoid arthritis, which she receives regularly from her orthopedist. She also takes Plaquenil for her rheumatoid arthritis. She can take anti-inflammatories like Aleve  and Motrin , although she is already on Voltaren.  She mentions an upcoming prescription for prednisone  due to a recent allergic reaction, which is unrelated to her current foot pain.  She does not drive.      Objective:    Physical Exam VASCULAR: DP and PT pulse palpable. Foot is warm and well-perfused. Capillary fill time is brisk. DERMATOLOGIC: Normal skin turgor, texture, and temperature. No open lesions, rashes, or ulcerations. NEUROLOGIC: Normal sensation to light touch and pressure. No paresthesias. ORTHOPEDIC: Sharp pain on palpation of plantar medial and central heel along plantar fascia  and into metatarsal area with range of motion of digits. No ecchymosis or bruising. Smooth pain-free range of motion of all other examined joints. No gross deformity.   No images are attached to the encounter.    Results Right foot X-ray: No fracture or stress fracture of the calcaneus or sesamoids; presence of calcaneal spur (12/04/2023)   Assessment:   1. Plantar fascial fibromatosis   2. Heel spur, right   3. Metatarsalgia, right foot      Plan:  Patient was evaluated and treated and all questions answered.  Assessment and Plan Assessment & Plan Plantar fasciitis Acute plantar fasciitis with inflammation of the plantar fascia, affecting the heel and extending to the metatarsal area. Pain is severe and widespread, making it difficult to apply pressure on the foot. No fractures noted on radiographs. Differential includes inflammation of the sesamoid bones, but no fractures are present. The condition is expected to take two to three months to resolve due to the location on the foot, which is prone to reinjury with weight-bearing activities. - Prescribe a walking boot to immobilize and rest the foot. - Advise against driving while wearing the boot as it immobilizes the ankle. - Recommend starting home physical therapy exercises after one week of rest. - Plan to evaluate progress in one month and consider corticosteroid injection if no improvement. - Discussed that 90% of cases do not require surgery or procedures.  Heel spur Presence of a heel spur noted on x-ray, likely pre-existing and not the primary cause of current pain. - Not a significant concern  at this time.  Osteoarthritis Chronic osteoarthritis managed with Voltaren (diclofenac).  Rheumatoid arthritis Chronic rheumatoid arthritis managed with Plaquenil (hydroxychloroquine).  Follow-up Follow-up plan to monitor progress and adjust treatment as necessary. - Schedule follow-up appointment in one month to assess  progress and determine need for further intervention.      Return in about 1 month (around 01/04/2024) for recheck plantar fasciitis / right foot pain .

## 2023-12-26 ENCOUNTER — Ambulatory Visit: Payer: Self-pay

## 2023-12-26 ENCOUNTER — Ambulatory Visit: Admission: EM | Admit: 2023-12-26 | Discharge: 2023-12-26 | Disposition: A | Discharge status: Home / Self Care

## 2023-12-26 ENCOUNTER — Other Ambulatory Visit: Payer: Self-pay

## 2023-12-26 DIAGNOSIS — M62830 Muscle spasm of back: Secondary | ICD-10-CM | POA: Diagnosis not present

## 2023-12-26 DIAGNOSIS — M5432 Sciatica, left side: Secondary | ICD-10-CM | POA: Diagnosis not present

## 2023-12-26 MED ORDER — KETOROLAC TROMETHAMINE 60 MG/2ML IM SOLN
60.0000 mg | Freq: Once | INTRAMUSCULAR | Status: AC
  Administered 2023-12-26 (×2): 60 mg via INTRAMUSCULAR

## 2023-12-26 MED ORDER — BACLOFEN 10 MG PO TABS: 10.0000 mg | ORAL_TABLET | Freq: Three times a day (TID) | ORAL | 0 refills | Status: DC

## 2023-12-26 NOTE — Telephone Encounter (Signed)
 FYI Only or Action Required?: FYI only for provider.  Patient was last seen in primary care on 11/03/2023 by Alvia Selinda PARAS, MD.  Called Nurse Triage reporting Back Pain.  Symptoms began several days ago.  Interventions attempted: OTC medications: tylenol , ibuprofen , muscle rub.  Symptoms are: unchanged.  Triage Disposition: See HCP Within 24 Hours (Or PCP Triage)  Patient/caregiver understands and will follow disposition?: Yes              Copied from CRM [id number]. Topic: Clinical - Red Word Triage >> Dec 26, 2023  8:24 AM Melanie Bell wrote: Red Word that prompted transfer to Nurse Triage:  She is having lower back pain. Pain level is a 9. She cannot bend to put on her clothes because of pain. This is going on for a couple days. Reason for Disposition  [1] SEVERE back pain (e.g., excruciating, unable to do any normal activities) AND [2] not improved 2 hours after pain medicine  Answer Assessment - Initial Assessment Questions 1. ONSET: When did the pain begin? (e.g., minutes, hours, days)     3-4 days ago 2. LOCATION: Where does it hurt? (upper, mid or lower back)     Lower back 3. SEVERITY: How bad is the pain?  (e.g., Scale 1-10; mild, moderate, or severe)     severe 4. PATTERN: Is the pain constant? (e.g., yes, no; constant, intermittent)      constant 5. RADIATION: Does the pain shoot into your legs or somewhere else?     Yes, left leg 6. CAUSE:  What do you think is causing the back pain?      unknown 7. BACK OVERUSE:  Any recent lifting of heavy objects, strenuous work or exercise?     denies 8. MEDICINES: What have you taken so far for the pain? (e.g., nothing, acetaminophen , NSAIDS)     Tylenol , ibuprofen , muscle rub 9. NEUROLOGIC SYMPTOMS: Do you have any weakness, numbness, or problems with bowel/bladder control?     denies 10. OTHER SYMPTOMS: Do you have any other symptoms? (e.g., fever, abdomen pain, burning with urination, blood  in urine)       denies 11. PREGNANCY: Is there any chance you are pregnant? When was your last menstrual period?       na  Protocols used: Back Pain-A-AH

## 2023-12-26 NOTE — ED Provider Notes (Signed)
 [facility]  Note:  This document was prepared using Conservation officer, historic buildings and may include unintentional dictation errors.  MRN: [id number] DOB: [birth year]  Subjective:   Melanie Bell is a 33 y.o. female presenting for left lower back pain x 1 week with radiation to the left leg.  Patient reports past history of of lower back pain but never with radiation to the leg and never this severe.  Patient has been taking over-the-counter ibuprofen  and Tylenol  with minimal improvement.  Patient denies any known injury or trauma that may have exacerbated injury.  Patient is having severe difficulty with ambulation and performing activities of daily living.   Current Facility-Administered Medications:    ketorolac  (TORADOL ) injection 60 mg, 60 mg, Intramuscular, Once, Shikita Vaillancourt B, NP  Current Outpatient Medications:    baclofen  (LIORESAL ) 10 MG tablet, Take 1 tablet (10 mg total) by mouth 3 (three) times daily., Disp: 30 each, Rfl: 0   hydrOXYzine  (ATARAX ) 10 MG tablet, Take 10 mg by mouth 2 (two) times daily., Disp: , Rfl:    lithium  carbonate (LITHOBID ) 300 MG ER tablet, Take by mouth daily., Disp: , Rfl:    ondansetron  (ZOFRAN ) 8 MG tablet, Take 8 mg by mouth., Disp: , Rfl:    albuterol  (VENTOLIN  HFA) 108 (90 Base) MCG/ACT inhaler, 2 puffs four times a day as needed (Patient taking differently: Inhale 2 puffs into the lungs every 8 (eight) hours as needed for wheezing or shortness of breath.), Disp: 18 g, Rfl: 1   ARIPiprazole  (ABILIFY ) 10 MG tablet, Take 10 mg by mouth daily. (Patient not taking: Reported on 11/23/2023), Disp: , Rfl:    cabergoline  (DOSTINEX ) 0.5 MG tablet, Take 1 tablet (0.5 mg total) by mouth once a week., Disp: 12 tablet, Rfl: 3   Cyanocobalamin  (VITAMIN B-12 PO), Take 1 capsule by mouth daily., Disp: , Rfl:    diclofenac (VOLTAREN) 75 MG EC tablet, Take 75 mg by mouth 2 (two) times daily., Disp: , Rfl:    hydroxychloroquine (PLAQUENIL) 200  MG tablet, 200 mg 2 (two) times daily., Disp: , Rfl:    lamoTRIgine  (LAMICTAL ) 25 MG tablet, Take 1 tablet (25 mg total) by mouth 2 (two) times daily., Disp: 60 tablet, Rfl: 2   levothyroxine  (SYNTHROID ) 75 MCG tablet, TAKE ONE TABLET BY MOUTH ONCE EVERY DAY BEFORE BREAKFAST, Disp: 28 tablet, Rfl: 10   Magnesium  500 MG CAPS, Take 500 mg by mouth at bedtime., Disp: , Rfl:    omeprazole  (PRILOSEC) 20 MG capsule, TAKE 1 CAPSULE (20 MG TOTAL) BY MOUTH DAILY., Disp: 90 capsule, Rfl: 2   ondansetron  (ZOFRAN -ODT) 8 MG disintegrating tablet, 1/2- 1 tablet q 8 hr prn nausea, vomiting, Disp: 20 tablet, Rfl: 0   predniSONE  (STERAPRED UNI-PAK 21 TAB) 10 MG (21) TBPK tablet, Take 6 pills on day one then decrease by 1 pill each day, Disp: 21 tablet, Rfl: 0   sertraline (ZOLOFT) 100 MG tablet, Take 100 mg by mouth daily., Disp: , Rfl:    sertraline (ZOLOFT) 50 MG tablet, Take 100 mg by mouth daily., Disp: , Rfl:    Allergies  Allergen Reactions   Abilify  [Aripiprazole ] Other (See Comments)    Reports dystonic reaction to Ability   Betadine [Povidone Iodine] Hives   Fish Allergy Hives   Shellfish-Derived Products Hives    Past Medical History:  Diagnosis Date   Acquired hypothyroidism    Allergy    Anxiety    Arthritis    ASCUS of cervix with  negative high risk HPV    Asthma    Depression    Foreign body aspiration    GERD (gastroesophageal reflux disease)    Hallucinations 09/30/2014   Sizoaffective   Hematemesis 01/14/2022   Hyperlipidemia    Hyperprolactinemia (HCC)    Hypertension    Hypoglycemia    Intentional ingestion of batteries 04/21/2016    2 AAA batteries and 1 thumb tack; intent to hurt herself/notes 04/21/2016   Malingering    Migraine without aura and without status migrainosus, not intractable    Peritoneal adhesions without obstruction 02/16/2022   Pharyngoesophageal dysphagia    PTSD (post-traumatic stress disorder)    Rheumatoid factor positive    Schizoaffective  disorder, bipolar type (HCC)    Seizure (HCC) 01/16/2023   Self-inflicted injury 11/23/2017   SS-A antibody positive    Suicidal ideation 01/02/2018   Tardive dyskinesia 10/2014   recent onset   Thyroid  disease      Past Surgical History:  Procedure Laterality Date   ABDOMINAL SURGERY     years ago to remove foreign objects   APPENDECTOMY     BREAST EXCISIONAL BIOPSY Right 09/03/2007   surgical bx removed fibroadeoma   BREAST LUMPECTOMY Right    CHOLECYSTECTOMY  02/2022   COLONOSCOPY WITH PROPOFOL  N/A 09/10/2015   Procedure: COLONOSCOPY WITH PROPOFOL ;  Surgeon: Gladis RAYMOND Mariner, MD;  Location: [facility] ENDOSCOPY;  Service: Endoscopy;  Laterality: N/A;   DIAGNOSTIC LAPAROSCOPY     ESOPHAGOGASTRODUODENOSCOPY N/A 11/28/2014   Procedure: ESOPHAGOGASTRODUODENOSCOPY (EGD);  Surgeon: Lamar ONEIDA Holmes, MD;  Location: [facility] ENDOSCOPY;  Service: Endoscopy;  Laterality: N/A;   ESOPHAGOGASTRODUODENOSCOPY N/A 02/21/2016   Procedure: ESOPHAGOGASTRODUODENOSCOPY (EGD);  Surgeon: Kavitha V Nandigam, MD;  Location: [facility] ENDOSCOPY;  Service: Endoscopy;  Laterality: N/A;   ESOPHAGOGASTRODUODENOSCOPY N/A 04/21/2016   Procedure: ESOPHAGOGASTRODUODENOSCOPY (EGD);  Surgeon: Lupita FORBES Commander, MD;  Location: [facility] ENDOSCOPY;  Service: Endoscopy;  Laterality: N/A;   ESOPHAGOGASTRODUODENOSCOPY N/A 05/06/2016   Procedure: ESOPHAGOGASTRODUODENOSCOPY (EGD);  Surgeon: Ruel Kung, MD;  Location: [facility] ENDOSCOPY;  Service: Gastroenterology;  Laterality: N/A;   ESOPHAGOGASTRODUODENOSCOPY N/A 06/13/2016   Procedure: ESOPHAGOGASTRODUODENOSCOPY (EGD);  Surgeon: Rogelia Copping, MD;  Location: [facility] ENDOSCOPY;  Service: Endoscopy;  Laterality: N/A;   ESOPHAGOGASTRODUODENOSCOPY N/A 01/14/2022   Procedure: ESOPHAGOGASTRODUODENOSCOPY (EGD);  Surgeon: Toledo, Ladell POUR, MD;  Location: [facility] ENDOSCOPY;  Service: Gastroenterology;  Laterality: N/A;   ESOPHAGOGASTRODUODENOSCOPY N/A 09/12/2023   Procedure: EGD (ESOPHAGOGASTRODUODENOSCOPY);   Surgeon: Toledo, Ladell POUR, MD;  Location: [facility] ENDOSCOPY;  Service: Gastroenterology;  Laterality: N/A;   ESOPHAGOGASTRODUODENOSCOPY (EGD) WITH PROPOFOL  N/A 02/29/2016   Procedure: ESOPHAGOGASTRODUODENOSCOPY (EGD) WITH PROPOFOL ;  Surgeon: Rogelia Copping, MD;  Location: [facility] ENDOSCOPY;  Service: Endoscopy;  Laterality: N/A;   ESOPHAGOGASTRODUODENOSCOPY (EGD) WITH PROPOFOL  N/A 01/15/2018   Procedure: ESOPHAGOGASTRODUODENOSCOPY (EGD) WITH PROPOFOL ;  Surgeon: Shaaron Lamar HERO, MD;  Location: [facility] ENDO [facility];  Service: Endoscopy;  Laterality: N/A;  retrieval forein body   LAPAROSCOPIC LYSIS OF ADHESIONS  02/16/2022   Procedure: LAPAROSCOPIC LYSIS OF ADHESIONS;  Surgeon: Desiderio Schanz, MD;  Location: [facility] ORS;  Service: General;;   LAPAROTOMY N/A 09/12/2015   Procedure: EXPLORATORY LAPAROTOMY;  Surgeon: Charlie FORBES Fell, MD;  Location: [facility] ORS;  Service: General;  Laterality: N/A;   SIGMOIDOSCOPY N/A 09/12/2015   Procedure: [place];  Surgeon: Charlie FORBES Fell, MD;  Location: [facility] ORS;  Service: General;  Laterality: N/A;   WISDOM TOOTH EXTRACTION      Family History  Problem Relation Age of Onset   Depression Mother  Hypertension Mother    Sleep apnea Mother    Asthma Mother    COPD Mother    Diabetes Mother    Anxiety disorder Mother    Arthritis Mother    Heart failure Mother    Breast cancer Maternal Aunt        20's   Stroke Maternal Grandmother    Heart attack Maternal Grandfather     Social History   Tobacco Use   Smoking status: Former    Current packs/day: 0.00    Average packs/day: 0.5 packs/day for 3.0 years (1.5 ttl pk-yrs)    Types: Cigarettes    Start date: 08/14/2013    Quit date: 08/14/2016    Years since quitting: 7.3    Passive exposure: Past   Smokeless tobacco: Never  Vaping Use   Vaping status: Never Used  Substance Use Topics   Alcohol use: No   Drug use: No    ROS Refer to HPI for ROS details.  Objective:   Vitals: BP (!) 108/54 (BP Location:  Left Arm)   Pulse 84   Temp 98.6 F (37 C) (Oral)   Resp 18   LMP 12/16/2023 (Exact Date)   SpO2 98%   Physical Exam Vitals and nursing note reviewed.  Constitutional:      General: She is not in acute distress.    Appearance: Normal appearance. She is well-developed. She is not ill-appearing or toxic-appearing.  HENT:     Head: Normocephalic and atraumatic.     Nose: Nose normal.     Mouth/Throat:     Mouth: Mucous membranes are moist.  Eyes:     General:        Right eye: No discharge.        Left eye: No discharge.     Extraocular Movements: Extraocular movements intact.     Conjunctiva/sclera: Conjunctivae normal.  Cardiovascular:     Rate and Rhythm: Normal rate.  Pulmonary:     Effort: Pulmonary effort is normal. No respiratory distress.  Musculoskeletal:     Lumbar back: Spasms and tenderness present. No swelling or bony tenderness. Decreased range of motion. Positive left straight leg raise test. Negative right straight leg raise test.       Back:  Skin:    General: Skin is warm and dry.  Neurological:     General: No focal deficit present.     Mental Status: She is alert and oriented to person, place, and time.  Psychiatric:        Mood and Affect: Mood normal.        Behavior: Behavior normal.     Procedures  No results found for this or any previous visit (from the past 24 hours).  Assessment and Plan :     Discharge Instructions       1. Sciatica of left side (Primary) - ketorolac  (TORADOL ) IM injection 60 mg given in UC for acute lower back pain secondary to sciatica - Continue taking twice daily diclofenac 75 mg as directed for rheumatoid arthritis and lower back pain  2. Muscle spasm of back - baclofen  (LIORESAL ) 10 MG tablet; Take 1 tablet (10 mg total) by mouth 3 (three) times daily.  Dispense: 30 each; Refill: 0 - Apply heat to the area of discomfort 2-3 times a day for 10 to 15 minutes at a time to help relax muscles and decrease  pain. - No heavy lifting or strenuous physical activity as this may exacerbate injury. - Continue  to monitor symptoms for any changes.  If there is any escalation of current symptoms or development of new symptoms follow-up in ER for further evaluation management     Ethel KATHEE Aurea Aurea Ethel B, NP 12/26/23 1321

## 2023-12-26 NOTE — Discharge Instructions (Signed)
  1. Sciatica of left side (Primary) - ketorolac  (TORADOL ) IM injection 60 mg given in UC for acute lower back pain secondary to sciatica - Continue taking twice daily diclofenac 75 mg as directed for rheumatoid arthritis and lower back pain  2. Muscle spasm of back - baclofen  (LIORESAL ) 10 MG tablet; Take 1 tablet (10 mg total) by mouth 3 (three) times daily.  Dispense: 30 each; Refill: 0 - Apply heat to the area of discomfort 2-3 times a day for 10 to 15 minutes at a time to help relax muscles and decrease pain. - No heavy lifting or strenuous physical activity as this may exacerbate injury. - Continue to monitor symptoms for any changes.  If there is any escalation of current symptoms or development of new symptoms follow-up in ER for further evaluation management

## 2023-12-26 NOTE — Telephone Encounter (Signed)
 Noted  Pt has a appt tomorrow.  KP

## 2023-12-26 NOTE — ED Triage Notes (Signed)
 Pt is here with lower back pain that started a week ago, pt has taken OTC meds to relieve discomfort.

## 2023-12-27 ENCOUNTER — Ambulatory Visit: Admitting: Internal Medicine

## 2024-01-01 ENCOUNTER — Ambulatory Visit: Admitting: Internal Medicine

## 2024-01-01 ENCOUNTER — Telehealth: Payer: Self-pay | Admitting: Internal Medicine

## 2024-01-01 ENCOUNTER — Ambulatory Visit: Payer: Self-pay

## 2024-01-01 NOTE — Telephone Encounter (Signed)
 Called pt per Dr. Justus to come into office today for her back pain. No answer. Left vm informing patient that she needs to come and asked pt to give call back to let us  know.

## 2024-01-01 NOTE — Telephone Encounter (Signed)
 Noted  KP

## 2024-01-01 NOTE — Telephone Encounter (Signed)
 FYI Only or Action Required?: FYI only for provider.  Patient was last seen in primary care on 11/03/2023 by Alvia Selinda PARAS, MD.  Called Nurse Triage reporting Back Pain.  Symptoms began a week ago.  Interventions attempted: Prescription medications: Toradol , muscle relaxer, Rest, hydration, or home remedies, and Ice/heat application.  Symptoms are: unchanged.  Triage Disposition: See HCP Within 4 Hours (Or PCP Triage)  Patient/caregiver understands and will follow disposition?: Yes, but will wait  Copied from CRM #[id number]. Topic: Clinical - Red Word Triage >> Jan 01, 2024  9:38 AM Leonette SQUIBB wrote: Red Word that prompted transfer to Nurse Triage: pt called with lower back pain.  She went to an urgent care last week and they gave her a Toradol  and muscle relaxer.  She said it is hurting so bad she is having a hard time talking Reason for Disposition  [1] SEVERE back pain (e.g., excruciating, unable to do any normal activities) AND [2] not improved 2 hours after pain medicine  Answer Assessment - Initial Assessment Questions 1. ONSET: When did the pain begin? (e.g., minutes, hours, days)     One week ago  2. LOCATION: Where does it hurt? (upper, mid or lower back)     Lower back both sides  3. SEVERITY: How bad is the pain?  (e.g., Scale 1-10; mild, moderate, or severe)     10/10 4. PATTERN: Is the pain constant? (e.g., yes, no; constant, intermittent)      Constant 5. RADIATION: Does the pain shoot into your legs or somewhere else?     No  6. CAUSE:  What do you think is causing the back pain?      Injury over one week ago 7. BACK OVERUSE:  Any recent lifting of heavy objects, strenuous work or exercise?     No 8. MEDICINES: What have you taken so far for the pain? (e.g., nothing, acetaminophen , NSAIDS)     Toradol  at [facility] 12/25/13  helped for short time. Muscle relaxers not helping. Using heat.  9. NEUROLOGIC SYMPTOMS: Do you have any weakness, numbness, or  problems with bowel/bladder control?     Denies  10. OTHER SYMPTOMS: Do you have any other symptoms? (e.g., fever, abdomen pain, burning with urination, blood in urine)       Denies  Protocols used: Back Pain-A-AH

## 2024-01-01 NOTE — Telephone Encounter (Signed)
 Noted  Pt has a appt.  KP

## 2024-01-03 ENCOUNTER — Ambulatory Visit (INDEPENDENT_AMBULATORY_CARE_PROVIDER_SITE_OTHER): Admitting: Podiatry

## 2024-01-03 VITALS — Ht 67.0 in | Wt 287.0 lb

## 2024-01-03 DIAGNOSIS — M722 Plantar fascial fibromatosis: Secondary | ICD-10-CM | POA: Diagnosis not present

## 2024-01-03 NOTE — Progress Notes (Signed)
  Subjective:  Patient ID: Melanie Bell, female    DOB: 1990/08/16,  MRN: [id number]  Chief Complaint  Patient presents with   Plantar Fasciitis    RM 4 follow up 1 month planter fascitis right foot pain. Patient states pain has not improved with walking boot or using at home exercises.    33 y.o. female presents with the above complaint. History confirmed with patient.  Still has not had much improvement taking the diclofenac and using the boot  Objective:  Physical Exam: warm, good capillary refill, no trophic changes or ulcerative lesions, normal DP and PT pulses, normal sensory exam, and sharp pain along the plantar fascia and insertion of the calcaneus.   Radiographs: Multiple views x-ray of the right foot: Previous radiographs show a small spur Assessment:   1. Plantar fascial fibromatosis      Plan:  Patient was evaluated and treated and all questions answered.  Minimal improvement with boot and diclofenac I recommended corticosteroid injection and this was performed as noted below.  Also recommended formal physical therapy and referral was placed to do this at [facility] outpatient rehab.  Follow-up in 6 weeks to reevaluate.  If no improvement by that point would recommend MRI.  No restrictions on PT as well as weightbearing status.  After sterile prep with povidone-iodine solution and alcohol, the right heel was injected with 0.5cc 2% xylocaine  plain, 0.5cc 0.5% marcaine  plain, 20 mg triamcinolone  acetonide, and 4 mg dexamethasone  was injected along the medial plantar fascia at the insertion on the plantar calcaneus. The patient tolerated the procedure well without complication.   Return in about 6 weeks (around 02/14/2024) for recheck plantar fasciitis.

## 2024-01-04 ENCOUNTER — Ambulatory Visit: Admitting: Student

## 2024-01-04 ENCOUNTER — Ambulatory Visit
Admission: RE | Admit: 2024-01-04 | Discharge: 2024-01-04 | Disposition: A | Discharge status: Home / Self Care | Source: Ambulatory Visit | Attending: Student | Admitting: Student

## 2024-01-04 ENCOUNTER — Encounter: Payer: Self-pay | Admitting: Student

## 2024-01-04 ENCOUNTER — Ambulatory Visit
Admission: RE | Admit: 2024-01-04 | Discharge: 2024-01-04 | Disposition: A | Discharge status: Home / Self Care | Attending: Student | Admitting: Student

## 2024-01-04 VITALS — BP 124/80 | HR 85 | Ht 67.0 in | Wt 284.4 lb

## 2024-01-04 DIAGNOSIS — M545 Low back pain, unspecified: Secondary | ICD-10-CM | POA: Insufficient documentation

## 2024-01-04 NOTE — Progress Notes (Signed)
 Established Patient Office Visit  Subjective   Patient ID: Melanie Bell, female    DOB: 01-18-1991  Age: 33 y.o. MRN: [id number]  Chief Complaint  Patient presents with   Back Pain    Patient is here today because she is having lower back pain, going on for a couple of weeks, recent fall, fell on her bottom, describes pain as a dull throbbing     Melanie Bell with medical hx listed below presents today for back pain. Reports fall round 8/10 while walking her front steps and knee buckled and she fell onto her lower back. Denis any head trauma from the the fall. Has the low back pain since then, initially felt pain was radiation down the legs but this resolved after a few days. Denies fevers, chills, bowel or bladder incontinence.  She was discharged with baclofen  which helps some but mostly makes her drowsy.   Patient Active Problem List   Diagnosis Date Noted   Acute midline low back pain without sciatica 01/04/2024   Concussion with no loss of consciousness 11/03/2023   Mild intermittent asthma with exacerbation 06/17/2023   Viral URI with cough 06/17/2023   Amenorrhea 04/18/2023   Oropharyngeal dysphagia 04/18/2023   Drug overdose, intentional self-harm, initial encounter (HCC) 01/24/2023   Insomnia 01/22/2023   Drug overdose, multiple drugs, intentional self-harm, initial encounter (HCC) 01/21/2023   Mild intermittent asthma 01/21/2023   Seizure (HCC) 01/16/2023   Major depressive disorder, recurrent episode, severe, without psychosis (HCC) 11/11/2022   Intentional overdose (HCC) 11/08/2022   Morbid obesity (HCC) 09/30/2021   Acquired hypothyroidism 06/25/2021   Migraine without aura and without status migrainosus, not intractable 03/05/2021   Mild intermittent asthma without complication 12/01/2020   Gastroesophageal reflux disease 12/01/2020   Hypoglycemia 12/01/2020   Rheumatoid factor positive 12/09/2019   SS-A antibody positive 10/28/2019   Chronic pain of  both knees 10/28/2019   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   Schizoaffective disorder, depressive type (HCC) 02/02/2018   Suicide attempt (HCC) 01/16/2018   Borderline personality disorder (HCC) 01/04/2018   PTSD (post-traumatic stress disorder) 01/03/2018   ASCUS of cervix with negative high risk HPV 08/28/2017   Malingering 05/06/2016      ROS Refer to HPI    Objective:     Outpatient Encounter Medications as of 01/04/2024  Medication Sig   albuterol  (VENTOLIN  HFA) 108 (90 Base) MCG/ACT inhaler 2 puffs four times a day as needed   baclofen  (LIORESAL ) 10 MG tablet Take 1 tablet (10 mg total) by mouth 3 (three) times daily.   Cyanocobalamin  (VITAMIN B-12 PO) Take 1 capsule by mouth daily.   diclofenac (VOLTAREN) 75 MG EC tablet Take 75 mg by mouth 2 (two) times daily.   hydroxychloroquine (PLAQUENIL) 200 MG tablet 200 mg 2 (two) times daily.   hydrOXYzine  (ATARAX ) 10 MG tablet Take 10 mg by mouth 2 (two) times daily.   levothyroxine  (SYNTHROID ) 75 MCG tablet TAKE ONE TABLET BY MOUTH ONCE EVERY DAY BEFORE BREAKFAST   lithium  carbonate (LITHOBID ) 300 MG ER tablet Take by mouth daily.   Magnesium  500 MG CAPS Take 500 mg by mouth at bedtime.   omeprazole  (PRILOSEC) 20 MG capsule TAKE 1 CAPSULE (20 MG TOTAL) BY MOUTH DAILY.   ondansetron  (ZOFRAN ) 8 MG tablet Take 8 mg by mouth.   ARIPiprazole  (ABILIFY ) 10 MG tablet Take 10 mg by mouth daily. (Patient not taking: Reported on 11/23/2023)   cabergoline  (DOSTINEX ) 0.5 MG tablet Take 1 tablet (0.5 mg total)  by mouth once a week. (Patient not taking: Reported on 01/04/2024)   lamoTRIgine  (LAMICTAL ) 25 MG tablet Take 1 tablet (25 mg total) by mouth 2 (two) times daily.   ondansetron  (ZOFRAN -ODT) 8 MG disintegrating tablet 1/2- 1 tablet q 8 hr prn nausea, vomiting   predniSONE  (STERAPRED UNI-PAK 21 TAB) 10 MG (21) TBPK tablet Take 6 pills on day one then decrease by 1 pill each day (Patient not taking: Reported on 01/04/2024)   sertraline  (ZOLOFT) 100 MG tablet Take 100 mg by mouth daily.   sertraline (ZOLOFT) 50 MG tablet Take 100 mg by mouth daily.   No facility-administered encounter medications on file as of 01/04/2024.    BP 124/80   Pulse 85   Ht 5' 7 (1.702 m)   Wt 284 lb 6 oz (129 kg)   LMP 12/16/2023 (Exact Date)   SpO2 96%   BMI 44.54 kg/m  BP Readings from Last 3 Encounters:  01/04/24 124/80  12/26/23 (!) 108/54  12/01/23 117/88    Physical Exam Constitutional:      Appearance: Normal appearance.  HENT:     Mouth/Throat:     Mouth: Mucous membranes are moist.     Pharynx: Oropharynx is clear.  Cardiovascular:     Rate and Rhythm: Normal rate and regular rhythm.  Pulmonary:     Effort: Pulmonary effort is normal.     Breath sounds: No rhonchi or rales.  Abdominal:     General: Abdomen is flat. Bowel sounds are normal. There is no distension.     Palpations: Abdomen is soft.     Tenderness: There is no abdominal tenderness.  Musculoskeletal:     Right lower leg: No edema.     Left lower leg: No edema.     Comments: TTP to very light touch of the lumbar spine around level of L3/L4 at midline and of paraspinal muscles, increase pain with extension, negative straight leg raise.   Skin:    General: Skin is warm and dry.     Capillary Refill: Capillary refill takes less than 2 seconds.  Neurological:     General: No focal deficit present.     Mental Status: She is alert and oriented to person, place, and time.  Psychiatric:        Mood and Affect: Mood normal.        Behavior: Behavior normal.        01/04/2024   10:23 AM 12/04/2023    4:00 PM 11/23/2023   10:47 AM  Depression screen PHQ 2/9  Decreased Interest 0 0 0  Down, Depressed, Hopeless 0 0 0  PHQ - 2 Score 0 0 0  Altered sleeping 0    Tired, decreased energy 0    Change in appetite 0    Feeling bad or failure about yourself  0    Trouble concentrating 0    Moving slowly or fidgety/restless 0    Suicidal thoughts 0    PHQ-9  Score 0    Difficult doing work/chores Not difficult at all  Not difficult at all       01/04/2024   10:23 AM 11/23/2023   10:47 AM 11/03/2023   10:49 AM 04/18/2023    9:14 AM  GAD 7 : Generalized Anxiety Score  Nervous, Anxious, on Edge 1 0 0 0  Control/stop worrying 0 0 0 0  Worry too much - different things 0 0 0 0  Trouble relaxing 0 0 0 0  Restless  0 0 0 0  Easily annoyed or irritable 0 0 1 0  Afraid - awful might happen 0 0 0 0  Total GAD 7 Score 1 0 1 0  Anxiety Difficulty Not difficult at all Not difficult at all Not difficult at all Not difficult at all    No results found for any visits on 01/04/24.  Last CBC Lab Results  Component Value Date   WBC 9.2 11/14/2023   WBC 9.2 11/14/2023   HGB 13.8 11/14/2023   HGB 13.8 11/14/2023   HCT 41.7 11/14/2023   HCT 42.2 11/14/2023   MCV 93.3 11/14/2023   MCV 93.2 11/14/2023   MCH 30.9 11/14/2023   MCH 30.5 11/14/2023   RDW 12.8 11/14/2023   RDW 12.9 11/14/2023   PLT 192 11/14/2023   PLT 192 11/14/2023   Last metabolic panel Lab Results  Component Value Date   GLUCOSE 110 (H) 11/14/2023   NA 140 11/14/2023   K 3.9 11/14/2023   CL 108 11/14/2023   CO2 26 11/14/2023   BUN 13 11/14/2023   CREATININE 0.67 11/14/2023   GFRNONAA >60 11/14/2023   CALCIUM 9.1 11/14/2023   PHOS 2.2 (L) 01/16/2023   PROT 6.7 11/14/2023   ALBUMIN 3.6 11/14/2023   BILITOT 1.0 11/14/2023   ALKPHOS 52 11/14/2023   AST 18 11/14/2023   ALT 13 11/14/2023   ANIONGAP 6 11/14/2023   Last lipids Lab Results  Component Value Date   CHOL 172 07/28/2023   HDL 49 07/28/2023   LDLCALC 109 (H) 07/28/2023   TRIG 72 07/28/2023   CHOLHDL 3.5 07/28/2023      The ASCVD Risk score (Arnett DK, et al., 2019) failed to calculate for the following reasons:   The 2019 ASCVD risk score is only valid for ages 22 to 53    Assessment & Plan:  Acute midline low back pain without sciatica Recent fall on 8/10 hitting lower back with midline pain since  then. Appeared to have some sciatic symptoms when evaluated on 8/12 at urgent care but this has resolved. Will obtain lumbar films given fall history. Continue voltaren and baclofen  as needed. Referral made to PT.  -     DG Lumbar Spine 2-3 Views; Future -     Ambulatory referral to Physical Therapy     Return if symptoms worsen or fail to improve, for back pain.    Harlene Saddler, MD

## 2024-01-11 ENCOUNTER — Telehealth: Payer: Self-pay

## 2024-01-11 NOTE — Telephone Encounter (Signed)
 Sent patient a mychart message informing XR results are not back.  - Lenna Hagarty M.

## 2024-01-11 NOTE — Telephone Encounter (Signed)
 Copied from CRM #[id number]. Topic: Clinical - Lab/Test Results >> Jan 11, 2024  7:52 AM Larissa RAMAN wrote: Reason for CRM: Patient requesting xray results.

## 2024-01-12 ENCOUNTER — Ambulatory Visit: Attending: Podiatry

## 2024-01-12 DIAGNOSIS — M722 Plantar fascial fibromatosis: Secondary | ICD-10-CM | POA: Diagnosis present

## 2024-01-12 NOTE — Therapy (Signed)
 OUTPATIENT PHYSICAL THERAPY LOWER EXTREMITY EVALUATION   Patient [name] MRN: [id number] DOB:1991-03-10, 33 y.o., female Today's Date: 01/14/2024  END OF SESSION:  PT End of Session - 01/14/24 1718     Visit Number 1    Number of Visits 17    Date for PT Re-Evaluation 03/08/24    Authorization Type 1-2x/week x8 weeks    PT Start Time 1015    PT Stop Time 1100    PT Time Calculation (min) 45 min    Activity Tolerance Patient tolerated treatment well          Past Medical History:  Diagnosis Date   Acquired hypothyroidism    Allergy    Anxiety    Arthritis    ASCUS of cervix with negative high risk HPV    Asthma    Depression    Foreign body aspiration    GERD (gastroesophageal reflux disease)    Hallucinations 09/30/2014   Sizoaffective   Hematemesis 01/14/2022   Hyperlipidemia    Hyperprolactinemia (HCC)    Hypertension    Hypoglycemia    Intentional ingestion of batteries 04/21/2016    2 AAA batteries and 1 thumb tack; intent to hurt herself/notes 04/21/2016   Malingering    Migraine without aura and without status migrainosus, not intractable    Peritoneal adhesions without obstruction 02/16/2022   Pharyngoesophageal dysphagia    PTSD (post-traumatic stress disorder)    Rheumatoid factor positive    Schizoaffective disorder, bipolar type (HCC)    Seizure (HCC) 01/16/2023   Self-inflicted injury 11/23/2017   SS-A antibody positive    Suicidal ideation 01/02/2018   Tardive dyskinesia 10/2014   recent onset   Thyroid  disease    Past Surgical History:  Procedure Laterality Date   ABDOMINAL SURGERY     years ago to remove foreign objects   APPENDECTOMY     BREAST EXCISIONAL BIOPSY Right 09/03/2007   surgical bx removed fibroadeoma   BREAST LUMPECTOMY Right    CHOLECYSTECTOMY  02/2022   COLONOSCOPY WITH PROPOFOL  N/A 09/10/2015   Procedure: COLONOSCOPY WITH PROPOFOL ;  Surgeon: Gladis RAYMOND Mariner, MD;  Location: [facility] ENDOSCOPY;  Service:  Endoscopy;  Laterality: N/A;   DIAGNOSTIC LAPAROSCOPY     ESOPHAGOGASTRODUODENOSCOPY N/A 11/28/2014   Procedure: ESOPHAGOGASTRODUODENOSCOPY (EGD);  Surgeon: Lamar ONEIDA Holmes, MD;  Location: [facility] ENDOSCOPY;  Service: Endoscopy;  Laterality: N/A;   ESOPHAGOGASTRODUODENOSCOPY N/A 02/21/2016   Procedure: ESOPHAGOGASTRODUODENOSCOPY (EGD);  Surgeon: Kavitha V Nandigam, MD;  Location: [facility] ENDOSCOPY;  Service: Endoscopy;  Laterality: N/A;   ESOPHAGOGASTRODUODENOSCOPY N/A 04/21/2016   Procedure: ESOPHAGOGASTRODUODENOSCOPY (EGD);  Surgeon: Lupita FORBES Commander, MD;  Location: [facility] ENDOSCOPY;  Service: Endoscopy;  Laterality: N/A;   ESOPHAGOGASTRODUODENOSCOPY N/A 05/06/2016   Procedure: ESOPHAGOGASTRODUODENOSCOPY (EGD);  Surgeon: Ruel Kung, MD;  Location: [facility] ENDOSCOPY;  Service: Gastroenterology;  Laterality: N/A;   ESOPHAGOGASTRODUODENOSCOPY N/A 06/13/2016   Procedure: ESOPHAGOGASTRODUODENOSCOPY (EGD);  Surgeon: Rogelia Copping, MD;  Location: [facility] ENDOSCOPY;  Service: Endoscopy;  Laterality: N/A;   ESOPHAGOGASTRODUODENOSCOPY N/A 01/14/2022   Procedure: ESOPHAGOGASTRODUODENOSCOPY (EGD);  Surgeon: Toledo, Ladell POUR, MD;  Location: [facility] ENDOSCOPY;  Service: Gastroenterology;  Laterality: N/A;   ESOPHAGOGASTRODUODENOSCOPY N/A 09/12/2023   Procedure: EGD (ESOPHAGOGASTRODUODENOSCOPY);  Surgeon: Toledo, Ladell POUR, MD;  Location: [facility] ENDOSCOPY;  Service: Gastroenterology;  Laterality: N/A;   ESOPHAGOGASTRODUODENOSCOPY (EGD) WITH PROPOFOL  N/A 02/29/2016   Procedure: ESOPHAGOGASTRODUODENOSCOPY (EGD) WITH PROPOFOL ;  Surgeon: Rogelia Copping, MD;  Location: [facility] ENDOSCOPY;  Service: Endoscopy;  Laterality: N/A;   ESOPHAGOGASTRODUODENOSCOPY (EGD) WITH PROPOFOL  N/A 01/15/2018  Procedure: ESOPHAGOGASTRODUODENOSCOPY (EGD) WITH PROPOFOL ;  Surgeon: Shaaron Lamar HERO, MD;  Location: [facility] ENDO SUITE;  Service: Endoscopy;  Laterality: N/A;  retrieval forein body   LAPAROSCOPIC LYSIS OF ADHESIONS  02/16/2022   Procedure: LAPAROSCOPIC LYSIS OF  ADHESIONS;  Surgeon: Desiderio Schanz, MD;  Location: [facility] ORS;  Service: General;;   LAPAROTOMY N/A 09/12/2015   Procedure: EXPLORATORY LAPAROTOMY;  Surgeon: Charlie FORBES Fell, MD;  Location: [facility] ORS;  Service: General;  Laterality: N/A;   SIGMOIDOSCOPY N/A 09/12/2015   Procedure: [place];  Surgeon: Charlie FORBES Fell, MD;  Location: [facility] ORS;  Service: General;  Laterality: N/A;   WISDOM TOOTH EXTRACTION     Patient Active Problem List   Diagnosis Date Noted   Acute midline low back pain without sciatica 01/04/2024   Concussion with no loss of consciousness 11/03/2023   Mild intermittent asthma with exacerbation 06/17/2023   Viral URI with cough 06/17/2023   Amenorrhea 04/18/2023   Oropharyngeal dysphagia 04/18/2023   Drug overdose, intentional self-harm, initial encounter (HCC) 01/24/2023   Insomnia 01/22/2023   Drug overdose, multiple drugs, intentional self-harm, initial encounter (HCC) 01/21/2023   Mild intermittent asthma 01/21/2023   Seizure (HCC) 01/16/2023   Major depressive disorder, recurrent episode, severe, without psychosis (HCC) 11/11/2022   Intentional overdose (HCC) 11/08/2022   Morbid obesity (HCC) 09/30/2021   Acquired hypothyroidism 06/25/2021   Migraine without aura and without status migrainosus, not intractable 03/05/2021   Mild intermittent asthma without complication 12/01/2020   Gastroesophageal reflux disease 12/01/2020   Hypoglycemia 12/01/2020   Rheumatoid factor positive 12/09/2019   SS-A antibody positive 10/28/2019   Chronic pain of both knees 10/28/2019   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   Schizoaffective disorder, depressive type (HCC) 02/02/2018   Suicide attempt (HCC) 01/16/2018   Borderline personality disorder (HCC) 01/04/2018   PTSD (post-traumatic stress disorder) 01/03/2018   ASCUS of cervix with negative high risk HPV 08/28/2017   Malingering 05/06/2016    PCP: Dr. Justus, MD  REFERRING PROVIDER: Dr. Silva, DPM  REFERRING  DIAG: M72.2 (ICD-10-CM) - Plantar fascial fibromatosis   THERAPY DIAG:  Plantar fascial fibromatosis  Rationale for Evaluation and Treatment: Rehabilitation  ONSET DATE: 11/17/23  SUBJECTIVE:   SUBJECTIVE STATEMENT: Pt began noticing progressively having R arch and heel pain; went to urgent care on 11/17/23 because it was getting to be too much.  No specific MOI.    Has seen podiatry; is wearing a walking boot.  Had an injection.  Referred to PT.  Having a lot of help from her husband because of her foot and her foot right.  Using a SPC- hoping to not use that long term  Agg: WB on R LE, prolonged standing and walk Allev: wearing a walking boot, steroid injection (minimal relief)  PERTINENT HISTORY: Early Aug- Aug 10th slipped and fell down her steps- strained her back  Also feels like her R low back is a little sore from using the boot for walking on  RLE  PAIN:  Are you having pain? Yes; R foot- 5/10; R low back 7/10   PRECAUTIONS: None  RED FLAGS: None   WEIGHT BEARING RESTRICTIONS: WBAT in boot presently  FALLS:  Has patient fallen in last 6 months? 1 fall recently down the steps in front of her house  LIVING ENVIRONMENT: Lives with: lives with their family and lives with their spouse Lives in: House/apartment Stairs: yes- 4 to enter home 1 tall at the top in front with railing; and 3 to enter back with  railing  Has following equipment at home: Single point cane  OCCUPATION: not presently working; enjoys taking walks for a hobby, enjoys sitting outside reading; doesn't drive (has seizures)  PLOF: Independent  PATIENT GOALS: Get out of the boot, stop using the cane, be able to put weight on her foot again without being limited by pain   NEXT MD VISIT: End of September  OBJECTIVE:  Note: Objective measures were completed at Evaluation unless otherwise noted.   PATIENT SURVEYS:   LEFS: 26/80   LOWER EXTREMITY ROM:  Active ROM Right eval Left eval   Hip flexion    Hip extension    Hip abduction    Hip adduction    Hip internal rotation    Hip external rotation    Knee flexion    Knee extension    Ankle dorsiflexion -10*   Ankle plantarflexion 45*   Ankle inversion 20*   Ankle eversion 20    (Blank rows = not tested)  LOWER EXTREMITY MMT:  MMT Right eval Left eval  Hip flexion    Hip extension    Hip abduction 3+/5   Hip adduction    Hip internal rotation    Hip external rotation    Knee flexion    Knee extension    Ankle dorsiflexion 4/5   Ankle plantarflexion 3-/5*   Ankle inversion    Ankle eversion     (Blank rows = not tested)  LOWER EXTREMITY SPECIAL TESTS:  TTP along R plantar fascia and MT heads  FUNCTIONAL TESTS: Pt requires UE support for sit to stand  GAIT: pt amb with SPC in R hand upon arrival; instructed pt on sequencing/mechanics using SPC in L hand to assist R LE                                                                                                                               TREATMENT DATE: 01/12/24    PATIENT EDUCATION: PT POC/goals, tx plan Education details: PT POC/goals Person educated: Patient Education method: Explanation Education comprehension: verbalized understanding  HOME EXERCISE PROGRAM: TB initiated at visit #2  ASSESSMENT:  CLINICAL IMPRESSION: Patient is a 33 y.o. F who was seen today for physical therapy evaluation and treatment for R plantar fasciitis and LBP.   OBJECTIVE IMPAIRMENTS: Abnormal gait, decreased activity tolerance, decreased balance, difficulty walking, decreased ROM, decreased strength, and pain.   ACTIVITY LIMITATIONS: carrying, standing, squatting, stairs, and locomotion level  PARTICIPATION LIMITATIONS: meal prep, cleaning, laundry, shopping, community activity, yard work, and church  PERSONAL FACTORS: Past/current experiences, Time since onset of injury/illness/exacerbation, and 1-2 comorbidities: LBP, see above PMH are also affecting  patient's functional outcome.   REHAB POTENTIAL: Good  CLINICAL DECISION MAKING: Evolving/moderate complexity  EVALUATION COMPLEXITY: Moderate   GOALS: Goals reviewed with patient? Yes  SHORT TERM GOALS: Target date: 01/20/24 Pt will be able to perform HEP for R ankle/foot ROM/strengthening with proper technique and PT verbal/tactile  cues needed <25% of the time Baseline: to be initiated at visit #2 Goal status: INITIAL   LONG TERM GOALS: Target date: 03/08/24  Imprive LEFS >10 points indicating pt able to perform her daily activities at home without being as significantly limited by R LE sx. Baseline: 26/80 Goal status: INITIAL  2.  Improve R ankle PF strength to >3+/5 to facilitate improved ability to amb without SPC and normal gait mechanics with R posterior chain activation with terminal stance Baseline: limited by pain 3-/5 MMT Goal status: INITIAL  3.  Pt will tolerate standing/walking x 15-20 min intervals for house chores, self care without wearing boot and <3/10 R foot pain Baseline: wearing boot during WB activities Goal status: INITIAL   PLAN:  PT FREQUENCY: 2x/week  PT DURATION: 8 weeks  PLANNED INTERVENTIONS: 97110-Therapeutic exercises, 97530- Therapeutic activity, [id number]- Neuromuscular re-education, 97535- Self Care, and [id number]- Manual therapy  PLAN FOR NEXT SESSION: ankle/foot ROM/strengthening   Vernell Reges, PT, DPT, OCS   Vernell FORBES Reges, PT 01/14/2024, 5:18 PM

## 2024-01-16 ENCOUNTER — Ambulatory Visit: Payer: Self-pay | Admitting: Student

## 2024-01-16 NOTE — Addendum Note (Signed)
 Addended by: Eshal Propps E on: 01/16/2024 10:08 AM   Modules accepted: Orders

## 2024-01-22 ENCOUNTER — Ambulatory Visit: Attending: Podiatry

## 2024-01-22 ENCOUNTER — Telehealth (INDEPENDENT_AMBULATORY_CARE_PROVIDER_SITE_OTHER): Admitting: Internal Medicine

## 2024-01-22 ENCOUNTER — Ambulatory Visit: Payer: Self-pay

## 2024-01-22 ENCOUNTER — Encounter: Payer: Self-pay | Admitting: Internal Medicine

## 2024-01-22 VITALS — Temp 103.0°F | Ht 67.0 in | Wt 275.0 lb

## 2024-01-22 DIAGNOSIS — J020 Streptococcal pharyngitis: Secondary | ICD-10-CM

## 2024-01-22 DIAGNOSIS — M722 Plantar fascial fibromatosis: Secondary | ICD-10-CM | POA: Diagnosis present

## 2024-01-22 MED ORDER — AMOXICILLIN 250 MG/5ML PO SUSR: 500.0000 mg | Freq: Three times a day (TID) | ORAL | 0 refills | Status: DC

## 2024-01-22 NOTE — Telephone Encounter (Signed)
 Scheduled video visit because pt requires 3 day notice for transportation.  FYI Only or Action Required?: FYI only for provider.  Patient was last seen in primary care on 01/04/2024 by Lemon Raisin, MD.  Called Nurse Triage reporting Sore Throat.  Symptoms began several days ago.  Interventions attempted: OTC medications: dayquil/nyquil.  Symptoms are: gradually worsening.  Triage Disposition: No disposition on file.  Patient/caregiver understands and will follow disposition?:  Reason for Disposition  [1] Sore throat is the only symptom AND [2] present > 48 hours  Answer Assessment - Initial Assessment Questions Negative at home covid test. Reports 3 day hx of fever and sore throat.  1. ONSET: When did the throat start hurting? (Hours or days ago)      Saturday night (3 days)  2. SEVERITY: How bad is the sore throat? (Scale 1-10; mild, moderate or severe)     8/10  3. STREP EXPOSURE: Has there been any exposure to strep within the past week? If Yes, ask: What type of contact occurred?      Unknown  4.  VIRAL SYMPTOMS: Are there any symptoms of a cold, such as a runny nose, cough, hoarse voice or red eyes?      Runny nose  5. FEVER: Do you have a fever? If Yes, ask: What is your temperature, how was it measured, and when did it start?     103  6. PUS ON THE TONSILS: Is there pus on the tonsils in the back of your throat?     Unknown, will check prior to video visit  Protocols used: Sore Throat-A-AH Copied from CRM [id number]. Topic: Clinical - Red Word Triage >> Jan 22, 2024  2:30 PM Amy B wrote: Red Word that prompted transfer to Nurse Triage: Fever, headache, sore throat

## 2024-01-22 NOTE — Therapy (Signed)
 OUTPATIENT PHYSICAL THERAPY LOWER EXTREMITY Treatment   Patient [name] MRN: [id number] DOB:1991/01/09, 33 y.o., female Today's Date: 01/22/2024  END OF SESSION:  PT End of Session - 01/22/24 1211     Visit Number 2    Number of Visits 17    Date for PT Re-Evaluation 03/08/24    Authorization Type 1-2x/week x8 weeks    PT Start Time 1230    PT Stop Time 1315    PT Time Calculation (min) 45 min    Activity Tolerance Patient tolerated treatment well    Behavior During Therapy [facility] for tasks assessed/performed          Past Medical History:  Diagnosis Date   Acquired hypothyroidism    Allergy    Anxiety    Arthritis    ASCUS of cervix with negative high risk HPV    Asthma    Depression    Foreign body aspiration    GERD (gastroesophageal reflux disease)    Hallucinations 09/30/2014   Sizoaffective   Hematemesis 01/14/2022   Hyperlipidemia    Hyperprolactinemia (HCC)    Hypertension    Hypoglycemia    Intentional ingestion of batteries 04/21/2016    2 AAA batteries and 1 thumb tack; intent to hurt herself/notes 04/21/2016   Malingering    Migraine without aura and without status migrainosus, not intractable    Peritoneal adhesions without obstruction 02/16/2022   Pharyngoesophageal dysphagia    PTSD (post-traumatic stress disorder)    Rheumatoid factor positive    Schizoaffective disorder, bipolar type (HCC)    Seizure (HCC) 01/16/2023   Self-inflicted injury 11/23/2017   SS-A antibody positive    Suicidal ideation 01/02/2018   Tardive dyskinesia 10/2014   recent onset   Thyroid  disease    Past Surgical History:  Procedure Laterality Date   ABDOMINAL SURGERY     years ago to remove foreign objects   APPENDECTOMY     BREAST EXCISIONAL BIOPSY Right 09/03/2007   surgical bx removed fibroadeoma   BREAST LUMPECTOMY Right    CHOLECYSTECTOMY  02/2022   COLONOSCOPY WITH PROPOFOL  N/A 09/10/2015   Procedure: COLONOSCOPY WITH PROPOFOL ;  Surgeon:  Gladis RAYMOND Mariner, MD;  Location: [facility] ENDOSCOPY;  Service: Endoscopy;  Laterality: N/A;   DIAGNOSTIC LAPAROSCOPY     ESOPHAGOGASTRODUODENOSCOPY N/A 11/28/2014   Procedure: ESOPHAGOGASTRODUODENOSCOPY (EGD);  Surgeon: Lamar ONEIDA Holmes, MD;  Location: [facility] ENDOSCOPY;  Service: Endoscopy;  Laterality: N/A;   ESOPHAGOGASTRODUODENOSCOPY N/A 02/21/2016   Procedure: ESOPHAGOGASTRODUODENOSCOPY (EGD);  Surgeon: Kavitha V Nandigam, MD;  Location: [facility] ENDOSCOPY;  Service: Endoscopy;  Laterality: N/A;   ESOPHAGOGASTRODUODENOSCOPY N/A 04/21/2016   Procedure: ESOPHAGOGASTRODUODENOSCOPY (EGD);  Surgeon: Lupita FORBES Commander, MD;  Location: [facility] ENDOSCOPY;  Service: Endoscopy;  Laterality: N/A;   ESOPHAGOGASTRODUODENOSCOPY N/A 05/06/2016   Procedure: ESOPHAGOGASTRODUODENOSCOPY (EGD);  Surgeon: Ruel Kung, MD;  Location: [facility] ENDOSCOPY;  Service: Gastroenterology;  Laterality: N/A;   ESOPHAGOGASTRODUODENOSCOPY N/A 06/13/2016   Procedure: ESOPHAGOGASTRODUODENOSCOPY (EGD);  Surgeon: Rogelia Copping, MD;  Location: [facility] ENDOSCOPY;  Service: Endoscopy;  Laterality: N/A;   ESOPHAGOGASTRODUODENOSCOPY N/A 01/14/2022   Procedure: ESOPHAGOGASTRODUODENOSCOPY (EGD);  Surgeon: Toledo, Ladell POUR, MD;  Location: [facility] ENDOSCOPY;  Service: Gastroenterology;  Laterality: N/A;   ESOPHAGOGASTRODUODENOSCOPY N/A 09/12/2023   Procedure: EGD (ESOPHAGOGASTRODUODENOSCOPY);  Surgeon: Toledo, Ladell POUR, MD;  Location: [facility] ENDOSCOPY;  Service: Gastroenterology;  Laterality: N/A;   ESOPHAGOGASTRODUODENOSCOPY (EGD) WITH PROPOFOL  N/A 02/29/2016   Procedure: ESOPHAGOGASTRODUODENOSCOPY (EGD) WITH PROPOFOL ;  Surgeon: Rogelia Copping, MD;  Location: [facility] ENDOSCOPY;  Service: Endoscopy;  Laterality: N/A;   ESOPHAGOGASTRODUODENOSCOPY (EGD) WITH PROPOFOL  N/A 01/15/2018   Procedure: ESOPHAGOGASTRODUODENOSCOPY (EGD) WITH PROPOFOL ;  Surgeon: Shaaron Lamar HERO, MD;  Location: [facility] ENDO SUITE;  Service: Endoscopy;  Laterality: N/A;  retrieval forein body   LAPAROSCOPIC LYSIS  OF ADHESIONS  02/16/2022   Procedure: LAPAROSCOPIC LYSIS OF ADHESIONS;  Surgeon: Desiderio Schanz, MD;  Location: [facility] ORS;  Service: General;;   LAPAROTOMY N/A 09/12/2015   Procedure: EXPLORATORY LAPAROTOMY;  Surgeon: Charlie FORBES Fell, MD;  Location: [facility] ORS;  Service: General;  Laterality: N/A;   SIGMOIDOSCOPY N/A 09/12/2015   Procedure: KINGST[place];  Surgeon: Charlie FORBES Fell, MD;  Location: [facility] ORS;  Service: General;  Laterality: N/A;   WISDOM TOOTH EXTRACTION     Patient Active Problem List   Diagnosis Date Noted   Acute midline low back pain without sciatica 01/04/2024   Concussion with no loss of consciousness 11/03/2023   Mild intermittent asthma with exacerbation 06/17/2023   Viral URI with cough 06/17/2023   Amenorrhea 04/18/2023   Oropharyngeal dysphagia 04/18/2023   Drug overdose, intentional self-harm, initial encounter (HCC) 01/24/2023   Insomnia 01/22/2023   Drug overdose, multiple drugs, intentional self-harm, initial encounter (HCC) 01/21/2023   Mild intermittent asthma 01/21/2023   Seizure (HCC) 01/16/2023   Major depressive disorder, recurrent episode, severe, without psychosis (HCC) 11/11/2022   Intentional overdose (HCC) 11/08/2022   Morbid obesity (HCC) 09/30/2021   Acquired hypothyroidism 06/25/2021   Migraine without aura and without status migrainosus, not intractable 03/05/2021   Mild intermittent asthma without complication 12/01/2020   Gastroesophageal reflux disease 12/01/2020   Hypoglycemia 12/01/2020   Rheumatoid factor positive 12/09/2019   SS-A antibody positive 10/28/2019   Chronic pain of both knees 10/28/2019   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   Schizoaffective disorder, depressive type (HCC) 02/02/2018   Suicide attempt (HCC) 01/16/2018   Borderline personality disorder (HCC) 01/04/2018   PTSD (post-traumatic stress disorder) 01/03/2018   ASCUS of cervix with negative high risk HPV 08/28/2017   Malingering 05/06/2016    PCP: Dr.  Justus, MD  REFERRING PROVIDER: Dr. 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  No specific MOI.    Has seen podiatry; is wearing a walking boot.  Had an injection.  Referred to PT.  Having a lot of help from her husband because of her foot and her foot right.  Using a SPC- hoping to not use that long term  Agg: WB on R LE, prolonged standing and walk Allev: wearing a walking boot, steroid injection (minimal relief)  PERTINENT HISTORY: Early Aug- Aug 10th slipped and fell down her steps- strained her back  Also feels like her R low back is a little sore from using the boot for walking on  RLE  PAIN:  Are you having pain? Yes; R foot- 5/10; R low back 7/10   PRECAUTIONS: None  RED FLAGS: None   WEIGHT BEARING RESTRICTIONS: WBAT in boot presently  FALLS:  Has patient fallen in last 6 months? 1 fall recently down the steps in front of her house  LIVING ENVIRONMENT: Lives with: lives with their family and lives with their spouse Lives in: House/apartment Stairs: yes- 4 to enter home 1 tall at  the top in front with railing; and 3 to enter back with railing  Has following equipment at home: Single point cane  OCCUPATION: not presently working; enjoys taking walks for a hobby, enjoys sitting outside reading; doesn't drive (has seizures)  PLOF: Independent  PATIENT GOALS: Get out of the boot, stop using the cane, be able to put weight on her foot again without being limited by pain   NEXT MD VISIT: End of September  OBJECTIVE:  Note: Objective measures were completed at Evaluation unless otherwise noted.   PATIENT SURVEYS:   LEFS:  26/80   LOWER EXTREMITY ROM:  Active ROM Right eval Left eval  Hip flexion    Hip extension    Hip abduction    Hip adduction    Hip internal rotation    Hip external rotation    Knee flexion    Knee extension    Ankle dorsiflexion -10*   Ankle plantarflexion 45*   Ankle inversion 20*   Ankle eversion 20    (Blank rows = not tested)  LOWER EXTREMITY MMT:  MMT Right eval Left eval  Hip flexion    Hip extension    Hip abduction 3+/5   Hip adduction    Hip internal rotation    Hip external rotation    Knee flexion    Knee extension    Ankle dorsiflexion 4/5   Ankle plantarflexion 3-/5*   Ankle inversion    Ankle eversion     (Blank rows = not tested)  LOWER EXTREMITY SPECIAL TESTS:  TTP along R plantar fascia and MT heads  FUNCTIONAL TESTS: Pt requires UE support for sit to stand  GAIT: pt amb with SPC in R hand upon arrival; instructed pt on sequencing/mechanics using SPC in L hand to assist R LE                                                                                                                               TREATMENT DATE: 01/22/24 Subjective: Pt reports she is hoping to not wear the boot as much; doesn't put it on when she gets up in the night.  Wears it during the day.   Objective: Pain:4/10 plantar fascia/arch  Manual Therapy: 15 min STM R plantar fascia Ankle DF MWM x 15 Manual toe extension stretch, PF stretch: 30 second intervals    Therapeutic Exercise:15 min Ankle AROM: PF/DF x 10, Inv/ev x 10 Seated heel raises x 15 Seated toe raises x 15 Standing hip abd x 10, ea LE Standing hip ext x10, ea LE Self PF mob with tennis ball x 1 min  Gait training:10 min In sneakers, in parallel bars focusing on heel strike and toe off Lateral stepping in parallel bars Step over hurdles (6 inch) with emphasis on heel strike on R and normal toe off on R- 2 laps  PATIENT EDUCATION: PT POC/goals, tx plan, HEP instruction today, discussed how to  perform STM  with small ball under PF Education details: PT POC/goals Person educated: Patient Education method: Explanation Education comprehension: verbalized understanding  HOME EXERCISE PROGRAM: Access Code: [email or web address] URL: [email or web address] Date: 01/22/2024 Prepared by: Vernell Reges  Exercises - Seated Plantar Fascia Mobilization with Small Ball  - 2 x daily - 7 x weekly - 3 minutes hold - Seated Plantar Fascia Stretch  - 2 x daily - 7 x weekly - 3 sets - 30 seconds hold - Seated Heel Raise  - 2 x daily - 7 x weekly - 2 sets - 10 reps - Seated Toe Raise  - 2 x daily - 7 x weekly - 2 sets - 10 reps - Standing Hip Abduction with Counter Support  - 2 x daily - 7 x weekly - 2 sets - 10 reps  ASSESSMENT:  CLINICAL IMPRESSION: Patient is a 33 y.o. F who was seen today for physical therapy treatment for R plantar fasciitis and LBP.  Able to progress to WBAT exercises out of the boot after manual therapy.  Pt reports 4/10 pain during session, no worse after exercises.  Able to amb with improved gait in sneakers at end of session, nearly symmetrical heel strike and toe off b/l, just decreased gait speed.  OBJECTIVE IMPAIRMENTS: Abnormal gait, decreased activity tolerance, decreased balance, difficulty walking, decreased ROM, decreased strength, and pain.   ACTIVITY LIMITATIONS: carrying, standing, squatting, stairs, and locomotion level  PARTICIPATION LIMITATIONS: meal prep, cleaning, laundry, shopping, community activity, yard work, and church  PERSONAL FACTORS: Past/current experiences, Time since onset of injury/illness/exacerbation, and 1-2 comorbidities: LBP, see above PMH are also affecting patient's functional outcome.   REHAB POTENTIAL: Good  CLINICAL DECISION MAKING: Evolving/moderate complexity  EVALUATION COMPLEXITY: Moderate   GOALS: Goals reviewed with patient? Yes  SHORT TERM GOALS: Target date: 01/20/24 Pt will be able to perform HEP for R  ankle/foot ROM/strengthening with proper technique and PT verbal/tactile cues needed <25% of the time Baseline: to be initiated at visit #2 Goal status: INITIAL   LONG TERM GOALS: Target date: 03/08/24  Imprive LEFS >10 points indicating pt able to perform her daily activities at home without being as significantly limited by R LE sx. Baseline: 26/80 Goal status: INITIAL  2.  Improve R ankle PF strength to >3+/5 to facilitate improved ability to amb without SPC and normal gait mechanics with R posterior chain activation with terminal stance Baseline: limited by pain 3-/5 MMT Goal status: INITIAL  3.  Pt will tolerate standing/walking x 15-20 min intervals for house chores, self care without wearing boot and <3/10 R foot pain Baseline: wearing boot during WB activities Goal status: INITIAL   PLAN:  PT FREQUENCY: 2x/week  PT DURATION: 8 weeks  PLANNED INTERVENTIONS: 97110-Therapeutic exercises, 97530- Therapeutic activity, [id number]- Neuromuscular re-education, 97535- Self Care, and [id number]- Manual therapy  PLAN FOR NEXT SESSION: ankle/foot ROM/strengthening, manual therapy   Vernell Reges, PT, DPT, OCS   Vernell FORBES Reges, PT 01/22/2024, 12:12 PM

## 2024-01-22 NOTE — Patient Instructions (Signed)
 Take Tylenol  [id number] mg every 6 hours for fever, headache and sore throat  Take Amoxicillin  suspension three times a day

## 2024-01-22 NOTE — Progress Notes (Signed)
 Date:  01/22/2024   Name:  Melanie Bell   DOB:  [birth year]   MRN:  [id number]  This encounter was conducted via video encounter. This platform was deemed appropriate for the issues to be addressed.  The patient was correctly identified.  I advised that I am conducting the visit from a secure room in my office at [facility] clinic.  The patient is located at home. The limitations of this form of encounter were discussed with the patient and he/she agreed to proceed.  Some vital signs will be absent.  Chief Complaint: Sore Throat (Sore throat and fever of 103. Cough, Headache, and Runny Nose. Neg Covid test at home. )  Sore Throat  This is a new problem. The current episode started yesterday. The maximum temperature recorded prior to her arrival was 100.4 - 100.9 F. Associated symptoms include coughing, ear pain, headaches and trouble swallowing. Pertinent negatives include no diarrhea, shortness of breath or vomiting. She has tried acetaminophen  for the symptoms.    Review of Systems  Constitutional:  Positive for fatigue and fever. Negative for chills.  HENT:  Positive for ear pain, sore throat and trouble swallowing. Negative for sinus pressure.   Respiratory:  Positive for cough. Negative for chest tightness and shortness of breath.   Cardiovascular:  Negative for chest pain and leg swelling.  Gastrointestinal:  Negative for constipation, diarrhea, nausea and vomiting.  Neurological:  Positive for headaches.  Psychiatric/Behavioral:  Negative for dysphoric mood and sleep disturbance. The patient is not nervous/anxious.      Lab Results  Component Value Date   NA 140 11/14/2023   K 3.9 11/14/2023   CO2 26 11/14/2023   GLUCOSE 110 (H) 11/14/2023   BUN 13 11/14/2023   CREATININE 0.67 11/14/2023   CALCIUM 9.1 11/14/2023   EGFR 113 04/18/2023   GFRNONAA >60 11/14/2023   Lab Results  Component Value Date   CHOL 172 07/28/2023   HDL 49 07/28/2023   LDLCALC 109 (H)  07/28/2023   TRIG 72 07/28/2023   CHOLHDL 3.5 07/28/2023   Lab Results  Component Value Date   TSH 0.770 07/28/2023   Lab Results  Component Value Date   HGBA1C 5.3 07/28/2023   Lab Results  Component Value Date   WBC 9.2 11/14/2023   WBC 9.2 11/14/2023   HGB 13.8 11/14/2023   HGB 13.8 11/14/2023   HCT 41.7 11/14/2023   HCT 42.2 11/14/2023   MCV 93.3 11/14/2023   MCV 93.2 11/14/2023   PLT 192 11/14/2023   PLT 192 11/14/2023   Lab Results  Component Value Date   ALT 13 11/14/2023   AST 18 11/14/2023   ALKPHOS 52 11/14/2023   BILITOT 1.0 11/14/2023   No results found for: MARIEN BOLLS, VD25OH   Patient Active Problem List   Diagnosis Date Noted   Acute midline low back pain without sciatica 01/04/2024   Concussion with no loss of consciousness 11/03/2023   Mild intermittent asthma with exacerbation 06/17/2023   Viral URI with cough 06/17/2023   Amenorrhea 04/18/2023   Oropharyngeal dysphagia 04/18/2023   Drug overdose, intentional self-harm, initial encounter (HCC) 01/24/2023   Insomnia 01/22/2023   Drug overdose, multiple drugs, intentional self-harm, initial encounter (HCC) 01/21/2023   Mild intermittent asthma 01/21/2023   Seizure (HCC) 01/16/2023   Major depressive disorder, recurrent episode, severe, without psychosis (HCC) 11/11/2022   Intentional overdose (HCC) 11/08/2022   Morbid obesity (HCC) 09/30/2021   Acquired hypothyroidism 06/25/2021   Migraine  without aura and without status migrainosus, not intractable 03/05/2021   Mild intermittent asthma without complication 12/01/2020   Gastroesophageal reflux disease 12/01/2020   Hypoglycemia 12/01/2020   Rheumatoid factor positive 12/09/2019   SS-A antibody positive 10/28/2019   Chronic pain of both knees 10/28/2019   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   Schizoaffective disorder, depressive type (HCC) 02/02/2018   Suicide attempt (HCC) 01/16/2018   Borderline personality disorder (HCC)  01/04/2018   PTSD (post-traumatic stress disorder) 01/03/2018   ASCUS of cervix with negative high risk HPV 08/28/2017   Malingering 05/06/2016    Allergies  Allergen Reactions   Abilify  [Aripiprazole ] Other (See Comments)    Reports dystonic reaction to Ability   Betadine [Povidone Iodine] Hives   Fish Allergy Hives   Shellfish-Derived Products Hives    Past Surgical History:  Procedure Laterality Date   ABDOMINAL SURGERY     years ago to remove foreign objects   APPENDECTOMY     BREAST EXCISIONAL BIOPSY Right 09/03/2007   surgical bx removed fibroadeoma   BREAST LUMPECTOMY Right    CHOLECYSTECTOMY  02/2022   COLONOSCOPY WITH PROPOFOL  N/A 09/10/2015   Procedure: COLONOSCOPY WITH PROPOFOL ;  Surgeon: Gladis RAYMOND Mariner, MD;  Location: [facility] ENDOSCOPY;  Service: Endoscopy;  Laterality: N/A;   DIAGNOSTIC LAPAROSCOPY     ESOPHAGOGASTRODUODENOSCOPY N/A 11/28/2014   Procedure: ESOPHAGOGASTRODUODENOSCOPY (EGD);  Surgeon: Lamar ONEIDA Holmes, MD;  Location: [facility] ENDOSCOPY;  Service: Endoscopy;  Laterality: N/A;   ESOPHAGOGASTRODUODENOSCOPY N/A 02/21/2016   Procedure: ESOPHAGOGASTRODUODENOSCOPY (EGD);  Surgeon: Kavitha V Nandigam, MD;  Location: [facility] ENDOSCOPY;  Service: Endoscopy;  Laterality: N/A;   ESOPHAGOGASTRODUODENOSCOPY N/A 04/21/2016   Procedure: ESOPHAGOGASTRODUODENOSCOPY (EGD);  Surgeon: Lupita FORBES Commander, MD;  Location: [facility] ENDOSCOPY;  Service: Endoscopy;  Laterality: N/A;   ESOPHAGOGASTRODUODENOSCOPY N/A 05/06/2016   Procedure: ESOPHAGOGASTRODUODENOSCOPY (EGD);  Surgeon: Ruel Kung, MD;  Location: [facility] ENDOSCOPY;  Service: Gastroenterology;  Laterality: N/A;   ESOPHAGOGASTRODUODENOSCOPY N/A 06/13/2016   Procedure: ESOPHAGOGASTRODUODENOSCOPY (EGD);  Surgeon: Rogelia Copping, MD;  Location: [facility] ENDOSCOPY;  Service: Endoscopy;  Laterality: N/A;   ESOPHAGOGASTRODUODENOSCOPY N/A 01/14/2022   Procedure: ESOPHAGOGASTRODUODENOSCOPY (EGD);  Surgeon: Toledo, Ladell POUR, MD;  Location: [facility]  ENDOSCOPY;  Service: Gastroenterology;  Laterality: N/A;   ESOPHAGOGASTRODUODENOSCOPY N/A 09/12/2023   Procedure: EGD (ESOPHAGOGASTRODUODENOSCOPY);  Surgeon: Toledo, Ladell POUR, MD;  Location: [facility] ENDOSCOPY;  Service: Gastroenterology;  Laterality: N/A;   ESOPHAGOGASTRODUODENOSCOPY (EGD) WITH PROPOFOL  N/A 02/29/2016   Procedure: ESOPHAGOGASTRODUODENOSCOPY (EGD) WITH PROPOFOL ;  Surgeon: Rogelia Copping, MD;  Location: [facility] ENDOSCOPY;  Service: Endoscopy;  Laterality: N/A;   ESOPHAGOGASTRODUODENOSCOPY (EGD) WITH PROPOFOL  N/A 01/15/2018   Procedure: ESOPHAGOGASTRODUODENOSCOPY (EGD) WITH PROPOFOL ;  Surgeon: Shaaron Lamar HERO, MD;  Location: [facility] ENDO SUITE;  Service: Endoscopy;  Laterality: N/A;  retrieval forein body   LAPAROSCOPIC LYSIS OF ADHESIONS  02/16/2022   Procedure: LAPAROSCOPIC LYSIS OF ADHESIONS;  Surgeon: Desiderio Schanz, MD;  Location: [facility] ORS;  Service: General;;   LAPAROTOMY N/A 09/12/2015   Procedure: EXPLORATORY LAPAROTOMY;  Surgeon: Charlie FORBES Fell, MD;  Location: [facility] ORS;  Service: General;  Laterality: N/A;   SIGMOIDOSCOPY N/A 09/12/2015   Procedure: [place];  Surgeon: Charlie FORBES Fell, MD;  Location: [facility] ORS;  Service: General;  Laterality: N/A;   WISDOM TOOTH EXTRACTION      Social History   Tobacco Use   Smoking status: Former    Current packs/day: 0.00    Average packs/day: 0.5 packs/day for 3.0 years (1.5 ttl pk-yrs)    Types: Cigarettes    Start date: 08/14/2013  Quit date: 08/14/2016    Years since quitting: 7.4    Passive exposure: Past   Smokeless tobacco: Never  Vaping Use   Vaping status: Never Used  Substance Use Topics   Alcohol use: No   Drug use: No     Medication list has been reviewed and updated.  Current Meds  Medication Sig   albuterol  (VENTOLIN  HFA) 108 (90 Base) MCG/ACT inhaler 2 puffs four times a day as needed   amoxicillin  (AMOXIL ) 250 MG/5ML suspension Take 10 mLs (500 mg total) by mouth 3 (three) times daily.   baclofen  (LIORESAL ) 10  MG tablet Take 1 tablet (10 mg total) by mouth 3 (three) times daily.   Cyanocobalamin  (VITAMIN B-12 PO) Take 1 capsule by mouth daily.   diclofenac (VOLTAREN) 75 MG EC tablet Take 75 mg by mouth 2 (two) times daily.   hydroxychloroquine (PLAQUENIL) 200 MG tablet 200 mg 2 (two) times daily.   hydrOXYzine  (ATARAX ) 10 MG tablet Take 10 mg by mouth 2 (two) times daily.   levothyroxine  (SYNTHROID ) 75 MCG tablet TAKE ONE TABLET BY MOUTH ONCE EVERY DAY BEFORE BREAKFAST   lithium  carbonate (LITHOBID ) 300 MG ER tablet Take by mouth daily.   Magnesium  500 MG CAPS Take 500 mg by mouth at bedtime.   omeprazole  (PRILOSEC) 20 MG capsule TAKE 1 CAPSULE (20 MG TOTAL) BY MOUTH DAILY.   ondansetron  (ZOFRAN ) 8 MG tablet Take 8 mg by mouth.   ondansetron  (ZOFRAN -ODT) 8 MG disintegrating tablet 1/2- 1 tablet q 8 hr prn nausea, vomiting       01/22/2024    3:44 PM 01/04/2024   10:23 AM 11/23/2023   10:47 AM 11/03/2023   10:49 AM  GAD 7 : Generalized Anxiety Score  Nervous, Anxious, on Edge 0 1 0 0  Control/stop worrying 0 0 0 0  Worry too much - different things 0 0 0 0  Trouble relaxing 0 0 0 0  Restless 0 0 0 0  Easily annoyed or irritable 0 0 0 1  Afraid - awful might happen 0 0 0 0  Total GAD 7 Score 0 1 0 1  Anxiety Difficulty Not difficult at all Not difficult at all Not difficult at all Not difficult at all       01/22/2024    3:44 PM 01/04/2024   10:23 AM 12/04/2023    4:00 PM  Depression screen PHQ 2/9  Decreased Interest 0 0 0  Down, Depressed, Hopeless 0 0 0  PHQ - 2 Score 0 0 0  Altered sleeping 0 0   Tired, decreased energy 0 0   Change in appetite 0 0   Feeling bad or failure about yourself  0 0   Trouble concentrating 0 0   Moving slowly or fidgety/restless 0 0   Suicidal thoughts 0 0   PHQ-9 Score 0 0   Difficult doing work/chores Not difficult at all Not difficult at all     BP Readings from Last 3 Encounters:  01/04/24 124/80  12/26/23 (!) 108/54  12/01/23 117/88     Physical Exam Constitutional:      Appearance: She is ill-appearing.  Pulmonary:     Effort: Pulmonary effort is normal.  Neurological:     General: No focal deficit present.     Wt Readings from Last 3 Encounters:  01/22/24 275 lb (124.7 kg)  01/04/24 284 lb 6 oz (129 kg)  01/03/24 287 lb (130.2 kg)    Temp (!) 103 F (39.4 C) (  Oral)   Ht 5' 7 (1.702 m)   Wt 275 lb (124.7 kg)   LMP 12/16/2023 (Exact Date)   BMI 43.07 kg/m   Assessment and Plan:  Problem List Items Addressed This Visit   None Visit Diagnoses       Pharyngitis due to Streptococcus species    -  Primary   severe throat pain and redness with hx strep neg Covid test Rec Tylenol  q 6 hours,fluids, rest, Amox 500 mg tid   Relevant Medications   amoxicillin  (AMOXIL ) 250 MG/5ML suspension      I spent 7 minutes on this encounter. 100% by video. No follow-ups on file.    Leita HILARIO Adie, MD [facility] [place]

## 2024-01-22 NOTE — Telephone Encounter (Signed)
 Noted  Pt has appt.  KP

## 2024-01-24 ENCOUNTER — Ambulatory Visit

## 2024-01-24 DIAGNOSIS — M722 Plantar fascial fibromatosis: Secondary | ICD-10-CM | POA: Diagnosis not present

## 2024-01-24 NOTE — Therapy (Signed)
 OUTPATIENT PHYSICAL THERAPY LOWER EXTREMITY Treatment   Patient [name] MRN: [id number] DOB:Mar 30, 1991, 33 y.o., female Today's Date: 01/24/2024  END OF SESSION:  PT End of Session - 01/24/24 1157     Visit Number 3    Number of Visits 17    Date for PT Re-Evaluation 03/08/24    Authorization Type 1-2x/week x8 weeks    PT Start Time 1200    PT Stop Time 1245    PT Time Calculation (min) 45 min    Activity Tolerance Patient tolerated treatment well    Behavior During Therapy [facility] for tasks assessed/performed          Past Medical History:  Diagnosis Date   Acquired hypothyroidism    Allergy    Anxiety    Arthritis    ASCUS of cervix with negative high risk HPV    Asthma    Depression    Foreign body aspiration    GERD (gastroesophageal reflux disease)    Hallucinations 09/30/2014   Sizoaffective   Hematemesis 01/14/2022   Hyperlipidemia    Hyperprolactinemia (HCC)    Hypertension    Hypoglycemia    Intentional ingestion of batteries 04/21/2016    2 AAA batteries and 1 thumb tack; intent to hurt herself/notes 04/21/2016   Malingering    Migraine without aura and without status migrainosus, not intractable    Peritoneal adhesions without obstruction 02/16/2022   Pharyngoesophageal dysphagia    PTSD (post-traumatic stress disorder)    Rheumatoid factor positive    Schizoaffective disorder, bipolar type (HCC)    Seizure (HCC) 01/16/2023   Self-inflicted injury 11/23/2017   SS-A antibody positive    Suicidal ideation 01/02/2018   Tardive dyskinesia 10/2014   recent onset   Thyroid  disease    Past Surgical History:  Procedure Laterality Date   ABDOMINAL SURGERY     years ago to remove foreign objects   APPENDECTOMY     BREAST EXCISIONAL BIOPSY Right 09/03/2007   surgical bx removed fibroadeoma   BREAST LUMPECTOMY Right    CHOLECYSTECTOMY  02/2022   COLONOSCOPY WITH PROPOFOL  N/A 09/10/2015   Procedure: COLONOSCOPY WITH PROPOFOL ;  Surgeon:  Gladis RAYMOND Mariner, MD;  Location: [facility] ENDOSCOPY;  Service: Endoscopy;  Laterality: N/A;   DIAGNOSTIC LAPAROSCOPY     ESOPHAGOGASTRODUODENOSCOPY N/A 11/28/2014   Procedure: ESOPHAGOGASTRODUODENOSCOPY (EGD);  Surgeon: Lamar ONEIDA Holmes, MD;  Location: [facility] ENDOSCOPY;  Service: Endoscopy;  Laterality: N/A;   ESOPHAGOGASTRODUODENOSCOPY N/A 02/21/2016   Procedure: ESOPHAGOGASTRODUODENOSCOPY (EGD);  Surgeon: Kavitha V Nandigam, MD;  Location: [facility] ENDOSCOPY;  Service: Endoscopy;  Laterality: N/A;   ESOPHAGOGASTRODUODENOSCOPY N/A 04/21/2016   Procedure: ESOPHAGOGASTRODUODENOSCOPY (EGD);  Surgeon: Lupita FORBES Commander, MD;  Location: [facility] ENDOSCOPY;  Service: Endoscopy;  Laterality: N/A;   ESOPHAGOGASTRODUODENOSCOPY N/A 05/06/2016   Procedure: ESOPHAGOGASTRODUODENOSCOPY (EGD);  Surgeon: Ruel Kung, MD;  Location: [facility] ENDOSCOPY;  Service: Gastroenterology;  Laterality: N/A;   ESOPHAGOGASTRODUODENOSCOPY N/A 06/13/2016   Procedure: ESOPHAGOGASTRODUODENOSCOPY (EGD);  Surgeon: Rogelia Copping, MD;  Location: [facility] ENDOSCOPY;  Service: Endoscopy;  Laterality: N/A;   ESOPHAGOGASTRODUODENOSCOPY N/A 01/14/2022   Procedure: ESOPHAGOGASTRODUODENOSCOPY (EGD);  Surgeon: Toledo, Ladell POUR, MD;  Location: [facility] ENDOSCOPY;  Service: Gastroenterology;  Laterality: N/A;   ESOPHAGOGASTRODUODENOSCOPY N/A 09/12/2023   Procedure: EGD (ESOPHAGOGASTRODUODENOSCOPY);  Surgeon: Toledo, Ladell POUR, MD;  Location: [facility] ENDOSCOPY;  Service: Gastroenterology;  Laterality: N/A;   ESOPHAGOGASTRODUODENOSCOPY (EGD) WITH PROPOFOL  N/A 02/29/2016   Procedure: ESOPHAGOGASTRODUODENOSCOPY (EGD) WITH PROPOFOL ;  Surgeon: Rogelia Copping, MD;  Location: [facility] ENDOSCOPY;  Service: Endoscopy;  Laterality: N/A;   ESOPHAGOGASTRODUODENOSCOPY (EGD) WITH PROPOFOL  N/A 01/15/2018   Procedure: ESOPHAGOGASTRODUODENOSCOPY (EGD) WITH PROPOFOL ;  Surgeon: Shaaron Lamar HERO, MD;  Location: [facility] ENDO SUITE;  Service: Endoscopy;  Laterality: N/A;  retrieval forein body   LAPAROSCOPIC LYSIS  OF ADHESIONS  02/16/2022   Procedure: LAPAROSCOPIC LYSIS OF ADHESIONS;  Surgeon: Desiderio Schanz, MD;  Location: [facility] ORS;  Service: General;;   LAPAROTOMY N/A 09/12/2015   Procedure: EXPLORATORY LAPAROTOMY;  Surgeon: Charlie FORBES Fell, MD;  Location: [facility] ORS;  Service: General;  Laterality: N/A;   SIGMOIDOSCOPY N/A 09/12/2015   Procedure: [place];  Surgeon: Charlie FORBES Fell, MD;  Location: [facility] ORS;  Service: General;  Laterality: N/A;   WISDOM TOOTH EXTRACTION     Patient Active Problem List   Diagnosis Date Noted   Acute midline low back pain without sciatica 01/04/2024   Concussion with no loss of consciousness 11/03/2023   Mild intermittent asthma with exacerbation 06/17/2023   Viral URI with cough 06/17/2023   Amenorrhea 04/18/2023   Oropharyngeal dysphagia 04/18/2023   Drug overdose, intentional self-harm, initial encounter (HCC) 01/24/2023   Insomnia 01/22/2023   Drug overdose, multiple drugs, intentional self-harm, initial encounter (HCC) 01/21/2023   Mild intermittent asthma 01/21/2023   Seizure (HCC) 01/16/2023   Major depressive disorder, recurrent episode, severe, without psychosis (HCC) 11/11/2022   Intentional overdose (HCC) 11/08/2022   Morbid obesity (HCC) 09/30/2021   Acquired hypothyroidism 06/25/2021   Migraine without aura and without status migrainosus, not intractable 03/05/2021   Mild intermittent asthma without complication 12/01/2020   Gastroesophageal reflux disease 12/01/2020   Hypoglycemia 12/01/2020   Rheumatoid factor positive 12/09/2019   SS-A antibody positive 10/28/2019   Chronic pain of both knees 10/28/2019   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   Schizoaffective disorder, depressive type (HCC) 02/02/2018   Suicide attempt (HCC) 01/16/2018   Borderline personality disorder (HCC) 01/04/2018   PTSD (post-traumatic stress disorder) 01/03/2018   ASCUS of cervix with negative high risk HPV 08/28/2017   Malingering 05/06/2016    PCP: Dr.  Justus, MD  REFERRING PROVIDER: Dr. 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  No specific MOI.    Has seen podiatry; is wearing a walking boot.  Had an injection.  Referred to PT.  Having a lot of help from her husband because of her foot and her foot right.  Using a SPC- hoping to not use that long term  Agg: WB on R LE, prolonged standing and walk Allev: wearing a walking boot, steroid injection (minimal relief)  PERTINENT HISTORY: Early Aug- Aug 10th slipped and fell down her steps- strained her back  Also feels like her R low back is a little sore from using the boot for walking on  RLE  PAIN:  Are you having pain? Yes; R foot- 5/10; R low back 7/10   PRECAUTIONS: None  RED FLAGS: None   WEIGHT BEARING RESTRICTIONS: WBAT in boot presently  FALLS:  Has patient fallen in last 6 months? 1 fall recently down the steps in front of her house  LIVING ENVIRONMENT: Lives with: lives with their family and lives with their spouse Lives in: House/apartment Stairs: yes- 4 to enter home 1 tall at  the top in front with railing; and 3 to enter back with railing  Has following equipment at home: Single point cane  OCCUPATION: not presently working; enjoys taking walks for a hobby, enjoys sitting outside reading; doesn't drive (has seizures)  PLOF: Independent  PATIENT GOALS: Get out of the boot, stop using the cane, be able to put weight on her foot again without being limited by pain   NEXT MD VISIT: End of September  OBJECTIVE:  Note: Objective measures were completed at Evaluation unless otherwise noted.   PATIENT SURVEYS:   LEFS:  26/80   LOWER EXTREMITY ROM:  Active ROM Right eval Left eval  Hip flexion    Hip extension    Hip abduction    Hip adduction    Hip internal rotation    Hip external rotation    Knee flexion    Knee extension    Ankle dorsiflexion -10*   Ankle plantarflexion 45*   Ankle inversion 20*   Ankle eversion 20    (Blank rows = not tested)  LOWER EXTREMITY MMT:  MMT Right eval Left eval  Hip flexion    Hip extension    Hip abduction 3+/5   Hip adduction    Hip internal rotation    Hip external rotation    Knee flexion    Knee extension    Ankle dorsiflexion 4/5   Ankle plantarflexion 3-/5*   Ankle inversion    Ankle eversion     (Blank rows = not tested)  LOWER EXTREMITY SPECIAL TESTS:  TTP along R plantar fascia and MT heads  FUNCTIONAL TESTS: Pt requires UE support for sit to stand  GAIT: pt amb with SPC in R hand upon arrival; instructed pt on sequencing/mechanics using SPC in L hand to assist R LE                                                                                                                               TREATMENT DATE: 01/24/24 Subjective: Pt reports she has not been in the boot since last visit.  This is feeling good.  Back pain has improved too.     Objective: Pain:2/10 plantar fascia/arch  Manual Therapy: 15 min STM R plantar fascia Ankle DF MWM x 15 Manual toe extension stretch, PF stretch: 30 second intervals    Therapeutic Exercise:25 min Ankle AROM: PF/DF x 10, Inv/ev x 10 Seated heel raises 2 x 15 (10# on R thigh) Seated toe raises2  x 15 Standing hip abd x 15, ea LE Standing hip ext x15, ea LE Self PF mob with tennis ball x 1 min Towel arch scrunches: 8 reps, then 8 heel slides- repeated 3 sets  Gait training:5 min In sneakers, in parallel bars focusing on heel strike and toe off Lateral stepping in parallel bars   PATIENT EDUCATION: PT POC/goals, tx plan, HEP instruction today, discussed how to perform STM with small  ball under PF  Education details: PT POC/goals Person educated: Patient Education method: Explanation Education comprehension: verbalized understanding  HOME EXERCISE PROGRAM: Access Code: [email or web address] URL: [email or web address] Date: 01/24/2024 Prepared by: Vernell Reges  Exercises - Seated Plantar Fascia Mobilization with Small Ball  - 2 x daily - 7 x weekly - 3 minutes hold - Seated Plantar Fascia Stretch  - 2 x daily - 7 x weekly - 3 sets - 30 seconds hold - Seated Heel Raise  - 2 x daily - 7 x weekly - 2 sets - 10 reps - Seated Toe Raise  - 2 x daily - 7 x weekly - 2 sets - 10 reps - Standing Hip Abduction with Counter Support  - 2 x daily - 7 x weekly - 2 sets - 10 reps - Towel Scrunches  - 1 x daily - 7 x weekly - 3 sets - 8-10 reps  ASSESSMENT:  CLINICAL IMPRESSION: Patient is a 33 y.o. F who was seen today for physical therapy treatment for R plantar fasciitis and LBP.  Arrived wearing her sneakers today and she has not needed to use her boot since last visit.  Tolerated manual therapy techniques well, reports tenderness in R medial arch.  Progressed HEP.  Overall her pain seems to be improving, reports 2/10 vs 4/10 arch pain at last time.  Able to Able to amb with improved gait in sneakers at end of session, nearly symmetrical heel strike and toe off b/l, just decreased gait speed.  OBJECTIVE IMPAIRMENTS: Abnormal gait, decreased activity tolerance, decreased balance, difficulty walking, decreased ROM, decreased strength, and pain.   ACTIVITY LIMITATIONS: carrying, standing, squatting, stairs, and locomotion level  PARTICIPATION LIMITATIONS: meal prep, cleaning, laundry, shopping, community activity, yard work, and church  PERSONAL FACTORS: Past/current experiences, Time since onset of injury/illness/exacerbation, and 1-2 comorbidities: LBP, see above PMH are also affecting patient's functional outcome.   REHAB POTENTIAL: Good  CLINICAL DECISION MAKING:  Evolving/moderate complexity  EVALUATION COMPLEXITY: Moderate   GOALS: Goals reviewed with patient? Yes  SHORT TERM GOALS: Target date: 01/20/24 Pt will be able to perform HEP for R ankle/foot ROM/strengthening with proper technique and PT verbal/tactile cues needed <25% of the time Baseline: to be initiated at visit #2 Goal status: INITIAL   LONG TERM GOALS: Target date: 03/08/24  Imprive LEFS >10 points indicating pt able to perform her daily activities at home without being as significantly limited by R LE sx. Baseline: 26/80 Goal status: INITIAL  2.  Improve R ankle PF strength to >3+/5 to facilitate improved ability to amb without SPC and normal gait mechanics with R posterior chain activation with terminal stance Baseline: limited by pain 3-/5 MMT Goal status: INITIAL  3.  Pt will tolerate standing/walking x 15-20 min intervals for house chores, self care without wearing boot and <3/10 R foot pain Baseline: wearing boot during WB activities Goal status: INITIAL   PLAN:  PT FREQUENCY: 2x/week  PT DURATION: 8 weeks  PLANNED INTERVENTIONS: 97110-Therapeutic exercises, 97530- Therapeutic activity, 97112- Neuromuscular re-education, 97535- Self Care, and [id number]- Manual therapy  PLAN FOR NEXT SESSION: ankle/foot ROM/strengthening, manual therapy, continue progressing LE strengthening   Vernell Reges, PT, DPT, OCS   Vernell FORBES Reges, PT 01/24/2024, 11:57 AM

## 2024-01-29 ENCOUNTER — Ambulatory Visit

## 2024-01-29 DIAGNOSIS — M722 Plantar fascial fibromatosis: Secondary | ICD-10-CM

## 2024-01-29 NOTE — Therapy (Incomplete)
 OUTPATIENT PHYSICAL THERAPY LOWER EXTREMITY Treatment/Progress Note/Re-certification note   Patient [name] MRN: [id number] DOB:October 28, 1990, 33 y.o., female Today's Date: 01/29/2024  END OF SESSION:  PT End of Session - 01/29/24 1257     Visit Number 4    Number of Visits 17    Date for PT Re-Evaluation 03/08/24    Authorization Type 1-2x/week x8 weeks    PT Start Time 1247    PT Stop Time 1330    PT Time Calculation (min) 43 min    Activity Tolerance Patient tolerated treatment well    Behavior During Therapy [facility] for tasks assessed/performed          Past Medical History:  Diagnosis Date   Acquired hypothyroidism    Allergy    Anxiety    Arthritis    ASCUS of cervix with negative high risk HPV    Asthma    Depression    Foreign body aspiration    GERD (gastroesophageal reflux disease)    Hallucinations 09/30/2014   Sizoaffective   Hematemesis 01/14/2022   Hyperlipidemia    Hyperprolactinemia (HCC)    Hypertension    Hypoglycemia    Intentional ingestion of batteries 04/21/2016    2 AAA batteries and 1 thumb tack; intent to hurt herself/notes 04/21/2016   Malingering    Migraine without aura and without status migrainosus, not intractable    Peritoneal adhesions without obstruction 02/16/2022   Pharyngoesophageal dysphagia    PTSD (post-traumatic stress disorder)    Rheumatoid factor positive    Schizoaffective disorder, bipolar type (HCC)    Seizure (HCC) 01/16/2023   Self-inflicted injury 11/23/2017   SS-A antibody positive    Suicidal ideation 01/02/2018   Tardive dyskinesia 10/2014   recent onset   Thyroid  disease    Past Surgical History:  Procedure Laterality Date   ABDOMINAL SURGERY     years ago to remove foreign objects   APPENDECTOMY     BREAST EXCISIONAL BIOPSY Right 09/03/2007   surgical bx removed fibroadeoma   BREAST LUMPECTOMY Right    CHOLECYSTECTOMY  02/2022   COLONOSCOPY WITH PROPOFOL  N/A 09/10/2015   Procedure:  COLONOSCOPY WITH PROPOFOL ;  Surgeon: Gladis RAYMOND Mariner, MD;  Location: [facility] ENDOSCOPY;  Service: Endoscopy;  Laterality: N/A;   DIAGNOSTIC LAPAROSCOPY     ESOPHAGOGASTRODUODENOSCOPY N/A 11/28/2014   Procedure: ESOPHAGOGASTRODUODENOSCOPY (EGD);  Surgeon: Lamar ONEIDA Holmes, MD;  Location: [facility] ENDOSCOPY;  Service: Endoscopy;  Laterality: N/A;   ESOPHAGOGASTRODUODENOSCOPY N/A 02/21/2016   Procedure: ESOPHAGOGASTRODUODENOSCOPY (EGD);  Surgeon: Kavitha V Nandigam, MD;  Location: [facility] ENDOSCOPY;  Service: Endoscopy;  Laterality: N/A;   ESOPHAGOGASTRODUODENOSCOPY N/A 04/21/2016   Procedure: ESOPHAGOGASTRODUODENOSCOPY (EGD);  Surgeon: Lupita FORBES Commander, MD;  Location: [facility] ENDOSCOPY;  Service: Endoscopy;  Laterality: N/A;   ESOPHAGOGASTRODUODENOSCOPY N/A 05/06/2016   Procedure: ESOPHAGOGASTRODUODENOSCOPY (EGD);  Surgeon: Ruel Kung, MD;  Location: [facility] ENDOSCOPY;  Service: Gastroenterology;  Laterality: N/A;   ESOPHAGOGASTRODUODENOSCOPY N/A 06/13/2016   Procedure: ESOPHAGOGASTRODUODENOSCOPY (EGD);  Surgeon: Rogelia Copping, MD;  Location: [facility] END[facility]COPY;  Service: Endoscopy;  Laterality: N/A;   ESOPHAGOGASTRODUODENOSCOPY N/A 01/14/2022   Procedure: ESOPHAGOGASTRODUODENOSCOPY (EGD);  Surgeon: Toledo, Ladell POUR, MD;  Location: [facility] ENDOSCOPY;  Service: Gastroenterology;  Laterality: N/A;   ESOPHAGOGASTRODUODENOSCOPY N/A 09/12/2023   Procedure: EGD (ESOPHAGOGASTRODUODENOSCOPY);  Surgeon: Toledo, Ladell POUR, MD;  Location: [facility] ENDOSCOPY;  Service: Gastroenterology;  Laterality: N/A;   ESOPHAGOGASTRODUODENOSCOPY (EGD) WITH PROPOFOL  N/A 02/29/2016   Procedure: ESOPHAGOGASTRODUODENOSCOPY (EGD) WITH PROPOFOL ;  Surgeon: Rogelia Copping, MD;  Location: [facility] ENDOSCOPY;  Service:  Endoscopy;  Laterality: N/A;   ESOPHAGOGASTRODUODENOSCOPY (EGD) WITH PROPOFOL  N/A 01/15/2018   Procedure: ESOPHAGOGASTRODUODENOSCOPY (EGD) WITH PROPOFOL ;  Surgeon: Shaaron Lamar HERO, MD;  Location: [facility] ENDO [facility];  Service: Endoscopy;  Laterality: N/A;   retrieval forein body   LAPAROSCOPIC LYSIS OF ADHESIONS  02/16/2022   Procedure: LAPAROSCOPIC LYSIS OF ADHESIONS;  Surgeon: Desiderio Schanz, MD;  Location: [facility] ORS;  Service: General;;   LAPAROTOMY N/A 09/12/2015   Procedure: EXPLORATORY LAPAROTOMY;  Surgeon: Charlie FORBES Fell, MD;  Location: [facility] ORS;  Service: General;  Laterality: N/A;   SIGMOIDOSCOPY N/A 09/12/2015   Procedure: [place];  Surgeon: Charlie FORBES Fell, MD;  Location: [facility] ORS;  Service: General;  Laterality: N/A;   WISDOM TOOTH EXTRACTION     Patient Active Problem List   Diagnosis Date Noted   Acute midline low back pain without sciatica 01/04/2024   Concussion with no loss of consciousness 11/03/2023   Mild intermittent asthma with exacerbation 06/17/2023   Viral URI with cough 06/17/2023   Amenorrhea 04/18/2023   Oropharyngeal dysphagia 04/18/2023   Drug overdose, intentional self-harm, initial encounter (HCC) 01/24/2023   Insomnia 01/22/2023   Drug overdose, multiple drugs, intentional self-harm, initial encounter (HCC) 01/21/2023   Mild intermittent asthma 01/21/2023   Seizure (HCC) 01/16/2023   Major depressive disorder, recurrent episode, severe, without psychosis (HCC) 11/11/2022   Intentional overdose (HCC) 11/08/2022   Morbid obesity (HCC) 09/30/2021   Acquired hypothyroidism 06/25/2021   Migraine without aura and without status migrainosus, not intractable 03/05/2021   Mild intermittent asthma without complication 12/01/2020   Gastroesophageal reflux disease 12/01/2020   Hypoglycemia 12/01/2020   Rheumatoid factor positive 12/09/2019   SS-A antibody positive 10/28/2019   Chronic pain of both knees 10/28/2019   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   Schizoaffective disorder, depressive type (HCC) 02/02/2018   Suicide attempt (HCC) 01/16/2018   Borderline personality disorder (HCC) 01/04/2018   PTSD (post-traumatic stress disorder) 01/03/2018   ASCUS of cervix with negative high risk HPV 08/28/2017    Malingering 05/06/2016    PCP: Dr. Justus, MD  REFERRING PROVIDER: Dr. 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  No specific MOI.    Has seen podiatry; is wearing a walking boot.  Had an injection.  Referred to PT.  Having a lot of help from her husband because of her foot and her foot right.  Using a SPC- hoping to not use that long term  Agg: WB on R LE, prolonged standing and walk Allev: wearing a walking boot, steroid injection (minimal relief)  PERTINENT HISTORY: Early Aug- Aug 10th slipped and fell down her steps- strained her back  Also feels like her R low back is a little sore from using the boot for walking on  RLE  PAIN:  Are you having pain? Yes; R foot- 5/10; R low back 7/10   PRECAUTIONS: None  RED FLAGS: None   WEIGHT BEARING RESTRICTIONS: WBAT in boot presently  FALLS:  Has patient fallen in last 6 months? 1 fall recently down the steps in front of her house  LIVING ENVIRONMENT: Lives with: lives with their family and lives with their spouse Lives in: House/apartment Stairs: yes- 4 to enter home 1  tall at the top in front with railing; and 3 to enter back with railing  Has following equipment at home: Single point cane  OCCUPATION: not presently working; enjoys taking walks for a hobby, enjoys sitting outside reading; doesn't drive (has seizures)  PLOF: Independent  PATIENT GOALS: Get out of the boot, stop using the cane, be able to put weight on her foot again without being limited by pain   NEXT MD VISIT: End of September  OBJECTIVE:  Note: Objective measures were completed at Evaluation unless otherwise  noted.   PATIENT SURVEYS:   LEFS: 26/80   LOWER EXTREMITY ROM:  Active ROM Right eval Left eval  Hip flexion    Hip extension    Hip abduction    Hip adduction    Hip internal rotation    Hip external rotation    Knee flexion    Knee extension    Ankle dorsiflexion -10*   Ankle plantarflexion 45*   Ankle inversion 20*   Ankle eversion 20    (Blank rows = not tested)  LOWER EXTREMITY MMT:  MMT Right eval Left eval  Hip flexion    Hip extension    Hip abduction 3+/5   Hip adduction    Hip internal rotation    Hip external rotation    Knee flexion    Knee extension    Ankle dorsiflexion 4/5   Ankle plantarflexion 3-/5*   Ankle inversion    Ankle eversion     (Blank rows = not tested)  LOWER EXTREMITY SPECIAL TESTS:  TTP along R plantar fascia and MT heads  FUNCTIONAL TESTS: Pt requires UE support for sit to stand  GAIT: pt amb with SPC in R hand upon arrival; instructed pt on sequencing/mechanics using SPC in L hand to assist R LE                                                                                                                               TREATMENT DATE: 01/29/24 Subjective: Pt reports she has not been in the boot since last visit.  This is feeling good.  Back pain has improved too.  Having a little soreness in heel; overall improving.  Functionally having difficulty going up stairs bc of heel pain; still having some soreness in plantar fascia with initial walking/few steps after being stationary.  Objective: Pain:2/10 heel  Manual Therapy:  STM R plantar fascia Ankle DF MWM x 15- not today Manual toe extension stretch, PF stretch: 30 second intervals- not today    Therapeutic Exercise:25 min Nustep seat #12 x 10 min Ankle AROM: PF/DF x 10, Inv/ev x 10- not today Standing hip abd x10, 2 sets, ea LE Standing hip ext x10, 2 sets, ea LE Marches in standing 2x10 Self PF mob with tennis ball x 1 min reviewed and discussed how to do in  standing at home  Towel arch scrunches: 8 reps, then 8 heel slides-  repeated 3 sets  Therapeutic Activities: SLS x 5- with intermittent UE support Standing heel raises 2x12 Amb on flat surface with upright posture in sneakers, practicing typical gait- pt notes mild 2/10 pain in R heel, no AD today LEFS: 31/80   PATIENT EDUCATION: PT POC/goals, tx plan, HEP instruction today, discussed how to perform STM with small ball under PF Education details: PT POC/goals Person educated: Patient Education method: Explanation Education comprehension: verbalized understanding  HOME EXERCISE PROGRAM: Access Code: [email or web address] URL: [email or web address] Date: 01/24/2024 Prepared by: Vernell Reges  Exercises - Seated Plantar Fascia Mobilization with Small Ball  - 2 x daily - 7 x weekly - 3 minutes hold - Seated Plantar Fascia Stretch  - 2 x daily - 7 x weekly - 3 sets - 30 seconds hold - Seated Heel Raise  - 2 x daily - 7 x weekly - 2 sets - 10 reps - Seated Toe Raise  - 2 x daily - 7 x weekly - 2 sets - 10 reps - Standing Hip Abduction with Counter Support  - 2 x daily - 7 x weekly - 2 sets - 10 reps - Towel Scrunches  - 1 x daily - 7 x weekly - 3 sets - 8-10 reps  ASSESSMENT:  CLINICAL IMPRESSION: Patient is a 33 y.o. F who was seen today for physical therapy treatment for R plantar fasciitis and LBP.  She has attended 3 PT treatment sessions so far and is making progress towards PT goals.  She is no longer ambulating in the walking boot and no longer requires any assistive device for ambulation.  She would benefit from continuing to improve ankle/foot mobility and strength to promote optimal gait mechanics, to improve standing walking tolerance, and to reduce pain.  Recommend continued PT as she has likely not met max benefit from skilled PT interventions.  OBJECTIVE IMPAIRMENTS: Abnormal gait, decreased activity tolerance, decreased balance, difficulty walking, decreased ROM,  decreased strength, and pain.   ACTIVITY LIMITATIONS: carrying, standing, squatting, stairs, and locomotion level  PARTICIPATION LIMITATIONS: meal prep, cleaning, laundry, shopping, community activity, yard work, and church  PERSONAL FACTORS: Past/current experiences, Time since onset of injury/illness/exacerbation, and 1-2 comorbidities: LBP, see above PMH are also affecting patient's functional outcome.   REHAB POTENTIAL: Good  CLINICAL DECISION MAKING: Evolving/moderate complexity  EVALUATION COMPLEXITY: Moderate   GOALS: Goals reviewed with patient? Yes  SHORT TERM GOALS: Target date: 01/20/24 Pt will be able to perform HEP for R ankle/foot ROM/strengthening with proper technique and PT verbal/tactile cues needed <25% of the time Baseline: initiated at visit #2 Goal status: In progress   LONG TERM GOALS: Target date: 03/08/24  Imprive LEFS >10 points indicating pt able to perform her daily activities at home without being as significantly limited by R LE sx. Baseline: 26/80; 9/15: 31/80 Goal status: In progress  2.  Improve R ankle PF strength to >3+/5 to facilitate improved ability to amb without SPC and normal gait mechanics with R posterior chain activation with terminal stance Baseline: limited by pain 3-/5 MMT Goal status: In progress  3.  Pt will tolerate standing/walking x 15-20 min intervals for house chores, self care without wearing boot and <3/10 R foot pain Baseline: wearing boot during WB activities; 9/15: no longer in boot, standing 10-15 min intervals 3/10 pain Goal status: In progress   PLAN:  PT FREQUENCY: 2x/week  PT DURATION: 8 weeks  PLANNED INTERVENTIONS: 97110-Therapeutic exercises, 97530- Therapeutic activity, [id number]- Neuromuscular re-education, 97535- Self Care, and [id number]- Manual  therapy  PLAN FOR NEXT SESSION: ankle/foot ROM/strengthening, manual therapy, continue progressing LE strengthening; recommend continued PT   Vernell Reges, PT,  DPT, OCS   Turner Kunzman E Lowana Hable, PT 01/29/2024, 12:57 PM

## 2024-02-02 ENCOUNTER — Encounter

## 2024-02-05 ENCOUNTER — Ambulatory Visit

## 2024-02-07 ENCOUNTER — Ambulatory Visit

## 2024-02-07 DIAGNOSIS — M722 Plantar fascial fibromatosis: Secondary | ICD-10-CM

## 2024-02-07 NOTE — Therapy (Signed)
 OUTPATIENT PHYSICAL THERAPY LOWER EXTREMITY TREATMENT   Patient [name] MRN: [id number] DOB:[birth year], 33 y.o., female Today's Date: 02/07/2024  END OF SESSION:  PT End of Session - 02/07/24 1139     Visit Number 5    Number of Visits 17    Date for Recertification  03/08/24    Authorization Type 1-2x/week x8 weeks    PT Start Time 1145    PT Stop Time 1230    PT Time Calculation (min) 45 min    Activity Tolerance Patient tolerated treatment well    Behavior During Therapy [facility] for tasks assessed/performed          Past Medical History:  Diagnosis Date   Acquired hypothyroidism    Allergy    Anxiety    Arthritis    ASCUS of cervix with negative high risk HPV    Asthma    Depression    Foreign body aspiration    GERD (gastroesophageal reflux disease)    Hallucinations 09/30/2014   Sizoaffective   Hematemesis 01/14/2022   Hyperlipidemia    Hyperprolactinemia    Hypertension    Hypoglycemia    Intentional ingestion of batteries 04/21/2016    2 AAA batteries and 1 thumb tack; intent to hurt herself/notes 04/21/2016   Malingering    Migraine without aura and without status migrainosus, not intractable    Peritoneal adhesions without obstruction 02/16/2022   Pharyngoesophageal dysphagia    PTSD (post-traumatic stress disorder)    Rheumatoid factor positive    Schizoaffective disorder, bipolar type (HCC)    Seizure (HCC) 01/16/2023   Self-inflicted injury 11/23/2017   SS-A antibody positive    Suicidal ideation 01/02/2018   Tardive dyskinesia 10/2014   recent onset   Thyroid  disease    Past Surgical History:  Procedure Laterality Date   ABDOMINAL SURGERY     years ago to remove foreign objects   APPENDECTOMY     BREAST EXCISIONAL BIOPSY Right 09/03/2007   surgical bx removed fibroadeoma   BREAST LUMPECTOMY Right    CHOLECYSTECTOMY  02/2022   COLONOSCOPY WITH PROPOFOL  N/A 09/10/2015   Procedure: COLONOSCOPY WITH PROPOFOL ;  Surgeon:  Gladis RAYMOND Mariner, MD;  Location: [facility] ENDOSCOPY;  Service: Endoscopy;  Laterality: N/A;   DIAGNOSTIC LAPAROSCOPY     ESOPHAGOGASTRODUODENOSCOPY N/A 11/28/2014   Procedure: ESOPHAGOGASTRODUODENOSCOPY (EGD);  Surgeon: Lamar ONEIDA Holmes, MD;  Location: [facility] ENDOSCOPY;  Service: Endoscopy;  Laterality: N/A;   ESOPHAGOGASTRODUODENOSCOPY N/A 02/21/2016   Procedure: ESOPHAGOGASTRODUODENOSCOPY (EGD);  Surgeon: Kavitha V Nandigam, MD;  Location: [facility] ENDOSCOPY;  Service: Endoscopy;  Laterality: N/A;   ESOPHAGOGASTRODUODENOSCOPY N/A 04/21/2016   Procedure: ESOPHAGOGASTRODUODENOSCOPY (EGD);  Surgeon: Lupita FORBES Commander, MD;  Location: [facility] ENDOSCOPY;  Service: Endoscopy;  Laterality: N/A;   ESOPHAGOGASTRODUODENOSCOPY N/A 05/06/2016   Procedure: ESOPHAGOGASTRODUODENOSCOPY (EGD);  Surgeon: Ruel Kung, MD;  Location: [facility] ENDOSCOPY;  Service: Gastroenterology;  Laterality: N/A;   ESOPHAGOGASTRODUODENOSCOPY N/A 06/13/2016   Procedure: ESOPHAGOGASTRODUODENOSCOPY (EGD);  Surgeon: Rogelia Copping, MD;  Location: [facility] ENDOSCOPY;  Service: Endoscopy;  Laterality: N/A;   ESOPHAGOGASTRODUODENOSCOPY N/A 01/14/2022   Procedure: ESOPHAGOGASTRODUODENOSCOPY (EGD);  Surgeon: Toledo, Ladell POUR, MD;  Location: [facility] ENDOSCOPY;  Service: Gastroenterology;  Laterality: N/A;   ESOPHAGOGASTRODUODENOSCOPY N/A 09/12/2023   Procedure: EGD (ESOPHAGOGASTRODUODENOSCOPY);  Surgeon: Toledo, Ladell POUR, MD;  Location: [facility] ENDOSCOPY;  Service: Gastroenterology;  Laterality: N/A;   ESOPHAGOGASTRODUODENOSCOPY (EGD) WITH PROPOFOL  N/A 02/29/2016   Procedure: ESOPHAGOGASTRODUODENOSCOPY (EGD) WITH PROPOFOL ;  Surgeon: Rogelia Copping, MD;  Location: [facility] ENDOSCOPY;  Service: Endoscopy;  Laterality:  N/A;   ESOPHAGOGASTRODUODENOSCOPY (EGD) WITH PROPOFOL  N/A 01/15/2018   Procedure: ESOPHAGOGASTRODUODENOSCOPY (EGD) WITH PROPOFOL ;  Surgeon: Shaaron Lamar HERO, MD;  Location: [facility] ENDO SUITE;  Service: Endoscopy;  Laterality: N/A;  retrieval forein body   LAPAROSCOPIC LYSIS  OF ADHESIONS  02/16/2022   Procedure: LAPAROSCOPIC LYSIS OF ADHESIONS;  Surgeon: Desiderio Schanz, MD;  Location: [facility] ORS;  Service: General;;   LAPAROTOMY N/A 09/12/2015   Procedure: EXPLORATORY LAPAROTOMY;  Surgeon: Charlie FORBES Fell, MD;  Location: [facility] ORS;  Service: General;  Laterality: N/A;   SIGMOIDOSCOPY N/A 09/12/2015   Procedure: [place];  Surgeon: Charlie FORBES Fell, MD;  Location: [facility] ORS;  Service: General;  Laterality: N/A;   WISDOM TOOTH EXTRACTION     Patient Active Problem List   Diagnosis Date Noted   Acute midline low back pain without sciatica 01/04/2024   Concussion with no loss of consciousness 11/03/2023   Mild intermittent asthma with exacerbation 06/17/2023   Viral URI with cough 06/17/2023   Amenorrhea 04/18/2023   Oropharyngeal dysphagia 04/18/2023   Drug overdose, intentional self-harm, initial encounter (HCC) 01/24/2023   Insomnia 01/22/2023   Drug overdose, multiple drugs, intentional self-harm, initial encounter (HCC) 01/21/2023   Mild intermittent asthma 01/21/2023   Seizure (HCC) 01/16/2023   Major depressive disorder, recurrent episode, severe, without psychosis (HCC) 11/11/2022   Intentional overdose (HCC) 11/08/2022   Morbid obesity (HCC) 09/30/2021   Acquired hypothyroidism 06/25/2021   Migraine without aura and without status migrainosus, not intractable 03/05/2021   Mild intermittent asthma without complication 12/01/2020   Gastroesophageal reflux disease 12/01/2020   Hypoglycemia 12/01/2020   Rheumatoid factor positive 12/09/2019   SS-A antibody positive 10/28/2019   Chronic pain of both knees 10/28/2019   Hyperprolactinemia 08/02/2019   Schizoaffective disorder, depressive type (HCC) 02/02/2018   Suicide attempt (HCC) 01/16/2018   Borderline personality disorder (HCC) 01/04/2018   PTSD (post-traumatic stress disorder) 01/03/2018   ASCUS of cervix with negative high risk HPV 08/28/2017   Malingering 05/06/2016    PCP: Dr. Justus,  MD  REFERRING PROVIDER: Dr. 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  No specific MOI.    Has seen podiatry; is wearing a walking boot.  Had an injection.  Referred to PT.  Having a lot of help from her husband because of her foot and her foot right.  Using a SPC- hoping to not use that long term  Agg: WB on R LE, prolonged standing and walk Allev: wearing a walking boot, steroid injection (minimal relief)  PERTINENT HISTORY: Early Aug- Aug 10th slipped and fell down her steps- strained her back  Also feels like her R low back is a little sore from using the boot for walking on  RLE  PAIN:  Are you having pain? Yes; R foot- 5/10; R low back 7/10   PRECAUTIONS: None  RED FLAGS: None   WEIGHT BEARING RESTRICTIONS: WBAT in boot presently  FALLS:  Has patient fallen in last 6 months? 1 fall recently down the steps in front of her house  LIVING ENVIRONMENT: Lives with: lives with their family and lives with their spouse Lives in: House/apartment Stairs: yes- 4 to enter home 1 tall at the top  in front with railing; and 3 to enter back with railing  Has following equipment at home: Single point cane  OCCUPATION: not presently working; enjoys taking walks for a hobby, enjoys sitting outside reading; doesn't drive (has seizures)  PLOF: Independent  PATIENT GOALS: Get out of the boot, stop using the cane, be able to put weight on her foot again without being limited by pain   NEXT MD VISIT: End of September  OBJECTIVE:  Note: Objective measures were completed at Evaluation unless otherwise noted.   PATIENT SURVEYS:   LEFS: 26/80   LOWER  EXTREMITY ROM:  Active ROM Right eval Left eval  Hip flexion    Hip extension    Hip abduction    Hip adduction    Hip internal rotation    Hip external rotation    Knee flexion    Knee extension    Ankle dorsiflexion -10*   Ankle plantarflexion 45*   Ankle inversion 20*   Ankle eversion 20    (Blank rows = not tested)  LOWER EXTREMITY MMT:  MMT Right eval Left eval  Hip flexion    Hip extension    Hip abduction 3+/5   Hip adduction    Hip internal rotation    Hip external rotation    Knee flexion    Knee extension    Ankle dorsiflexion 4/5   Ankle plantarflexion 3-/5*   Ankle inversion    Ankle eversion     (Blank rows = not tested)  LOWER EXTREMITY SPECIAL TESTS:  TTP along R plantar fascia and MT heads  FUNCTIONAL TESTS: Pt requires UE support for sit to stand  GAIT: pt amb with SPC in R hand upon arrival; instructed pt on sequencing/mechanics using SPC in L hand to assist R LE                                                                                                                               TREATMENT DATE: 02/07/24 Subjective: Pt reports she is overall feeling better; starting to tolerate putting more weight on her foot and standing/walking longer with less pain in her arch  Objective: Pain:1/10 heel  Manual Therapy:  STM R plantar fascia Ankle DF MWM x 15- not today Manual toe extension stretch, PF stretch: 30 second intervals x 4    Therapeutic Exercise: Nustep seat #12 x 10 min Standing gastroc stretch: 30 seconds x 4 R LE SLR in supine x10 ea LE Ankle AROM: PF/DF x 10, Inv/ev x 10- not today Standing hip abd x10, 2 sets, ea LE not today Standing hip ext x10, 2 sets, ea LE Marches in standing 2x10 Self PF mob with tennis ball x 1 min reviewed and discussed how to do in standing at home- not today Towel arch scrunches: 8 reps, then 8 heel slides- repeated 3 sets- reviewed for HEP  Therapeutic Activities: SLS x 5- with intermittent  UE support- 20-30 second intervals  R and L Standing heel raises 2x12 Standing hip extension x 20 ea LE LEFS: 31/80   PATIENT EDUCATION: PT POC/goals, tx plan, HEP instruction today, discussed how to perform STM with small ball under PF Education details: PT POC/goals Person educated: Patient Education method: Explanation Education comprehension: verbalized understanding  HOME EXERCISE PROGRAM: Access Code: [email or web address] URL: [email or web address] Date: 01/24/2024 Prepared by: Vernell Reges  Exercises - Seated Plantar Fascia Mobilization with Small Ball  - 2 x daily - 7 x weekly - 3 minutes hold - Seated Plantar Fascia Stretch  - 2 x daily - 7 x weekly - 3 sets - 30 seconds hold - Seated Heel Raise  - 2 x daily - 7 x weekly - 2 sets - 10 reps - Seated Toe Raise  - 2 x daily - 7 x weekly - 2 sets - 10 reps - Standing Hip Abduction with Counter Support  - 2 x daily - 7 x weekly - 2 sets - 10 reps - Towel Scrunches  - 1 x daily - 7 x weekly - 3 sets - 8-10 reps  ASSESSMENT:  CLINICAL IMPRESSION: Patient is a 33 y.o. F who was seen today for physical therapy treatment for R plantar fasciitis and LBP.  She has attended 3 PT treatment sessions so far and is making progress towards PT goals.  She was able to tolerate progression of SLS with increased time today; added SLR to address proximal LE/hip/quad mm weakness.  Still point tender along R medial plantar fascia. She would benefit from continuing to improve ankle/foot mobility and strength to promote optimal gait mechanics, to improve standing walking tolerance, and to reduce pain.  Recommend continued PT as she has likely not met max benefit from skilled PT interventions.  OBJECTIVE IMPAIRMENTS: Abnormal gait, decreased activity tolerance, decreased balance, difficulty walking, decreased ROM, decreased strength, and pain.   ACTIVITY LIMITATIONS: carrying, standing, squatting, stairs, and locomotion level  PARTICIPATION  LIMITATIONS: meal prep, cleaning, laundry, shopping, community activity, yard work, and church  PERSONAL FACTORS: Past/current experiences, Time since onset of injury/illness/exacerbation, and 1-2 comorbidities: LBP, see above PMH are also affecting patient's functional outcome.   REHAB POTENTIAL: Good  CLINICAL DECISION MAKING: Evolving/moderate complexity  EVALUATION COMPLEXITY: Moderate   GOALS: Goals reviewed with patient? Yes  SHORT TERM GOALS: Target date: 01/20/24 Pt will be able to perform HEP for R ankle/foot ROM/strengthening with proper technique and PT verbal/tactile cues needed <25% of the time Baseline: initiated at visit #2 Goal status: In progress   LONG TERM GOALS: Target date: 03/08/24  Imprive LEFS >10 points indicating pt able to perform her daily activities at home without being as significantly limited by R LE sx. Baseline: 26/80; 9/15: 31/80 Goal status: In progress  2.  Improve R ankle PF strength to >3+/5 to facilitate improved ability to amb without SPC and normal gait mechanics with R posterior chain activation with terminal stance Baseline: limited by pain 3-/5 MMT Goal status: In progress  3.  Pt will tolerate standing/walking x 15-20 min intervals for house chores, self care without wearing boot and <3/10 R foot pain Baseline: wearing boot during WB activities; 9/15: no longer in boot, standing 10-15 min intervals 3/10 pain Goal status: In progress   PLAN:  PT FREQUENCY: 2x/week  PT DURATION: 8 weeks  PLANNED INTERVENTIONS: 97110-Therapeutic exercises, 97530- Therapeutic activity, [id number]- Neuromuscular re-education, 97535- Self Care, and [id number]- Manual therapy  PLAN FOR NEXT SESSION: ankle/foot ROM/strengthening, manual therapy, continue progressing LE strengthening; recommend continued  PT   Vernell Reges, PT, DPT, OCS   Vernell FORBES Reges, PT 02/07/2024, 11:39 AM

## 2024-02-12 ENCOUNTER — Ambulatory Visit (INDEPENDENT_AMBULATORY_CARE_PROVIDER_SITE_OTHER): Admitting: Podiatry

## 2024-02-12 VITALS — Ht 67.0 in | Wt 275.0 lb

## 2024-02-12 DIAGNOSIS — M722 Plantar fascial fibromatosis: Secondary | ICD-10-CM | POA: Diagnosis not present

## 2024-02-13 NOTE — Progress Notes (Signed)
  Subjective:  Patient ID: Melanie Bell, female    DOB: [birth year],  MRN: [id number]  Chief Complaint  Patient presents with   Plantar Fasciitis    Rm 9 Pt is here to f/u on PF.    32 y.o. female presents with the above complaint. History confirmed with patient.  Starting show some improvement physical therapy has been helpful.  Objective:  Physical Exam: warm, good capillary refill, no trophic changes or ulcerative lesions, normal DP and PT pulses, normal sensory exam, and today only mild pain along the plantar fascia and insertion of the calcaneus.   Radiographs: Multiple views x-ray of the right foot: Previous radiographs show a small spur Assessment:   1. Plantar fascial fibromatosis      Plan:  Patient was evaluated and treated and all questions answered.  Starting to show improvement.  She may continue regular shoe gear at this point.  Do not see need for further injection at this point unless she does not improve or worsens.  She should continue physical therapy which has been very helpful.  Return to evaluate for final follow-up in 2 months.  Most of the pain now seems to be concentrated in the arch and we discussed appropriate shoe gear and support for this.   Return in about 2 months (around 04/13/2024) for recheck plantar fasciitis.

## 2024-02-14 ENCOUNTER — Ambulatory Visit (INDEPENDENT_AMBULATORY_CARE_PROVIDER_SITE_OTHER)

## 2024-02-14 ENCOUNTER — Ambulatory Visit: Attending: Podiatry

## 2024-02-14 ENCOUNTER — Ambulatory Visit
Admission: EM | Admit: 2024-02-14 | Discharge: 2024-02-14 | Disposition: A | Discharge status: Home / Self Care | Attending: Physician Assistant | Admitting: Physician Assistant

## 2024-02-14 DIAGNOSIS — S8002XA Contusion of left knee, initial encounter: Secondary | ICD-10-CM | POA: Diagnosis not present

## 2024-02-14 DIAGNOSIS — M722 Plantar fascial fibromatosis: Secondary | ICD-10-CM | POA: Insufficient documentation

## 2024-02-14 DIAGNOSIS — M25562 Pain in left knee: Secondary | ICD-10-CM

## 2024-02-14 NOTE — ED Provider Notes (Signed)
 [facility]    CSN: [id number] Arrival date & time: 02/14/24  1412      History   Chief Complaint Chief Complaint  Patient presents with   Fall   Knee Injury    HPI Melanie Bell is a 33 y.o. female with history of arthritis of knees, PTSD, borderline personality disorder, schizoaffective disorder, asthma, GERD, migraines, hypothyroidism, previous drug overdoses/suicide attempts, and depression.  Patient presents today for left knee pain, swelling and contusion after falling onto the knee a couple days ago. Patient has been using a walker, taking Voltaren, taking tylenol , and applying ice without relief. She is able to bear weight. She reports that she has pain in her knees (R>L) due to arthritis. She is concerned for underlying fracture. No other injuries or concerns.   HPI  Past Medical History:  Diagnosis Date   Acquired hypothyroidism    Allergy    Anxiety    Arthritis    ASCUS of cervix with negative high risk HPV    Asthma    Depression    Foreign body aspiration    GERD (gastroesophageal reflux disease)    Hallucinations 09/30/2014   Sizoaffective   Hematemesis 01/14/2022   Hyperlipidemia    Hyperprolactinemia    Hypertension    Hypoglycemia    Intentional ingestion of batteries 04/21/2016    2 AAA batteries and 1 thumb tack; intent to hurt herself/notes 04/21/2016   Malingering    Migraine without aura and without status migrainosus, not intractable    Peritoneal adhesions without obstruction 02/16/2022   Pharyngoesophageal dysphagia    PTSD (post-traumatic stress disorder)    Rheumatoid factor positive    Schizoaffective disorder, bipolar type (HCC)    Seizure (HCC) 01/16/2023   Self-inflicted injury 11/23/2017   SS-A antibody positive    Suicidal ideation 01/02/2018   Tardive dyskinesia 10/2014   recent onset   Thyroid  disease     Patient Active Problem List   Diagnosis Date Noted   Acute midline low back pain without sciatica  01/04/2024   Concussion with no loss of consciousness 11/03/2023   Mild intermittent asthma with exacerbation 06/17/2023   Viral URI with cough 06/17/2023   Amenorrhea 04/18/2023   Oropharyngeal dysphagia 04/18/2023   Drug overdose, intentional self-harm, initial encounter (HCC) 01/24/2023   Insomnia 01/22/2023   Drug overdose, multiple drugs, intentional self-harm, initial encounter (HCC) 01/21/2023   Mild intermittent asthma 01/21/2023   Seizure (HCC) 01/16/2023   Major depressive disorder, recurrent episode, severe, without psychosis (HCC) 11/11/2022   Intentional overdose (HCC) 11/08/2022   Morbid obesity (HCC) 09/30/2021   Acquired hypothyroidism 06/25/2021   Migraine without aura and without status migrainosus, not intractable 03/05/2021   Mild intermittent asthma without complication 12/01/2020   Gastroesophageal reflux disease 12/01/2020   Hypoglycemia 12/01/2020   Rheumatoid factor positive 12/09/2019   SS-A antibody positive 10/28/2019   Chronic pain of both knees 10/28/2019   Hyperprolactinemia 08/02/2019   Schizoaffective disorder, depressive type (HCC) 02/02/2018   Suicide attempt (HCC) 01/16/2018   Borderline personality disorder (HCC) 01/04/2018   PTSD (post-traumatic stress disorder) 01/03/2018   ASCUS of cervix with negative high risk HPV 08/28/2017   Malingering 05/06/2016    Past Surgical History:  Procedure Laterality Date   ABDOMINAL SURGERY     years ago to remove foreign objects   APPENDECTOMY     BREAST EXCISIONAL BIOPSY Right 09/03/2007   surgical bx removed fibroadeoma   BREAST LUMPECTOMY Right    CHOLECYSTECTOMY  02/2022   COLONOSCOPY WITH PROPOFOL  N/A 09/10/2015   Procedure: COLONOSCOPY WITH PROPOFOL ;  Surgeon: Gladis RAYMOND Mariner, MD;  Location: [facility] ENDOSCOPY;  Service: Endoscopy;  Laterality: N/A;   DIAGNOSTIC LAPAROSCOPY     ESOPHAGOGASTRODUODENOSCOPY N/A 11/28/2014   Procedure: ESOPHAGOGASTRODUODENOSCOPY (EGD);  Surgeon: Lamar ONEIDA Holmes,  MD;  Location: [facility] ENDOSCOPY;  Service: Endoscopy;  Laterality: N/A;   ESOPHAGOGASTRODUODENOSCOPY N/A 02/21/2016   Procedure: ESOPHAGOGASTRODUODENOSCOPY (EGD);  Surgeon: Kavitha V Nandigam, MD;  Location: [facility] ENDOSCOPY;  Service: Endoscopy;  Laterality: N/A;   ESOPHAGOGASTRODUODENOSCOPY N/A 04/21/2016   Procedure: ESOPHAGOGASTRODUODENOSCOPY (EGD);  Surgeon: Lupita FORBES Commander, MD;  Location: [facility] ENDOSCOPY;  Service: Endoscopy;  Laterality: N/A;   ESOPHAGOGASTRODUODENOSCOPY N/A 05/06/2016   Procedure: ESOPHAGOGASTRODUODENOSCOPY (EGD);  Surgeon: Ruel Kung, MD;  Location: [facility] ENDOSCOPY;  Service: Gastroenterology;  Laterality: N/A;   ESOPHAGOGASTRODUODENOSCOPY N/A 06/13/2016   Procedure: ESOPHAGOGASTRODUODENOSCOPY (EGD);  Surgeon: Rogelia Copping, MD;  Location: [facility] ENDOSCOPY;  Service: Endoscopy;  Laterality: N/A;   ESOPHAGOGASTRODUODENOSCOPY N/A 01/14/2022   Procedure: ESOPHAGOGASTRODUODENOSCOPY (EGD);  Surgeon: Toledo, Ladell POUR, MD;  Location: [facility] ENDOSCOPY;  Service: Gastroenterology;  Laterality: N/A;   ESOPHAGOGASTRODUODENOSCOPY N/A 09/12/2023   Procedure: EGD (ESOPHAGOGASTRODUODENOSCOPY);  Surgeon: Toledo, Ladell POUR, MD;  Location: [facility] ENDOSCOPY;  Service: Gastroenterology;  Laterality: N/A;   ESOPHAGOGASTRODUODENOSCOPY (EGD) WITH PROPOFOL  N/A 02/29/2016   Procedure: ESOPHAGOGASTRODUODENOSCOPY (EGD) WITH PROPOFOL ;  Surgeon: Rogelia Copping, MD;  Location: [facility] ENDOSCOPY;  Service: Endoscopy;  Laterality: N/A;   ESOPHAGOGASTRODUODENOSCOPY (EGD) WITH PROPOFOL  N/A 01/15/2018   Procedure: ESOPHAGOGASTRODUODENOSCOPY (EGD) WITH PROPOFOL ;  Surgeon: Shaaron Lamar HERO, MD;  Location: [facility] ENDO [facility]ITE;  Service: Endoscopy;  Laterality: N/A;  retrieval forein body   LAPAROSCOPIC LYSIS OF ADHESIONS  02/16/2022   Procedure: LAPAROSCOPIC LYSIS OF ADHESIONS;  Surgeon: Desiderio Schanz, MD;  Location: [facility] ORS;  Service: General;;   LAPAROTOMY N/A 09/12/2015   Procedure: EXPLORATORY LAPAROTOMY;  Surgeon: Charlie FORBES Fell,  MD;  Location: [facility] ORS;  Service: General;  Laterality: N/A;   SIGMOIDOSCOPY N/A 09/12/2015   Procedure: [place];  Surgeon: Charlie FORBES Fell, MD;  Location: [facility] ORS;  Service: General;  Laterality: N/A;   WISDOM TOOTH EXTRACTION      OB History     Gravida  5   Para  0   Term  0   Preterm  0   AB  3   Living  0      SAB  3   IAB  0   Ectopic  0   Multiple  0   Live Births               Home Medications    Prior to Admission medications   Medication Sig Start Date End Date Taking? Authorizing Provider  albuterol  (VENTOLIN  HFA) 108 (90 Base) MCG/ACT inhaler 2 puffs four times a day as needed 12/14/22  Yes Brimage, Vondra, DO  Cyanocobalamin  (VITAMIN B-12 PO) Take 1 capsule by mouth daily.   Yes [provider]  diclofenac (VOLTAREN) 75 MG EC tablet Take 75 mg by mouth 2 (two) times daily. 10/27/23  Yes [provider]  hydroxychloroquine (PLAQUENIL) 200 MG tablet 200 mg 2 (two) times daily.   Yes [provider]  hydrOXYzine  (ATARAX ) 10 MG tablet Take 10 mg by mouth 2 (two) times daily. 11/15/23  Yes [provider]  levothyroxine  (SYNTHROID ) 75 MCG tablet TAKE ONE TABLET BY MOUTH ONCE EVERY DAY BEFORE BREAKFAST 07/04/23  Yes Shamleffer, Ibtehal Jaralla, MD  lithium  carbonate (LITHOBID ) 300 MG ER tablet Take  by mouth daily. 12/20/23  Yes [provider]  Magnesium  500 MG CAPS Take 500 mg by mouth at bedtime.   Yes [provider]  omeprazole  (PRILOSEC) 20 MG capsule TAKE 1 CAPSULE (20 MG TOTAL) BY MOUTH DAILY. 06/07/22  Yes Justus Leita DEL, MD  ondansetron  (ZOFRAN ) 8 MG tablet Take 8 mg by mouth. 11/22/23  Yes [provider]  ondansetron  (ZOFRAN -ODT) 8 MG disintegrating tablet 1/2- 1 tablet q 8 hr prn nausea, vomiting 11/03/23  Yes Alvia Selinda PARAS, MD    Family History Family History  Problem Relation Age of Onset   Depression Mother    Hypertension Mother    Sleep apnea Mother    Asthma Mother     COPD Mother    Diabetes Mother    Anxiety disorder Mother    Arthritis Mother    Heart failure Mother    Breast cancer Maternal Aunt        20's   Stroke Maternal Grandmother    Heart attack Maternal Grandfather     Social History Social History   Tobacco Use   Smoking status: Former    Current packs/day: 0.00    Average packs/day: 0.5 packs/day for 3.0 years (1.5 ttl pk-yrs)    Types: Cigarettes    Start date: 08/14/2013    Quit date: 08/14/2016    Years since quitting: 7.5    Passive exposure: Past   Smokeless tobacco: Never  Vaping Use   Vaping status: Never Used  Substance Use Topics   Alcohol use: No   Drug use: No     Allergies   Abilify  [aripiprazole ], Betadine [povidone iodine], Fish allergy, and Shellfish-derived products   Review of Systems Review of Systems  Musculoskeletal:  Positive for arthralgias, gait problem and joint swelling.  Skin:  Positive for color change. Negative for wound.  Neurological:  Negative for weakness and numbness.     Physical Exam Triage Vital Signs ED Triage Vitals  Encounter Vitals Group     BP 02/14/24 1438 120/75     Girls Systolic BP Percentile --      Girls Diastolic BP Percentile --      Boys Systolic BP Percentile --      Boys Diastolic BP Percentile --      Pulse Rate 02/14/24 1438 77     Resp 02/14/24 1438 18     Temp 02/14/24 1438 98.3 F (36.8 C)     Temp Source 02/14/24 1438 Oral     SpO2 02/14/24 1438 100 %     Weight 02/14/24 1436 284 lb (128.8 kg)     Height --      Head Circumference --      Peak Flow --      Pain Score 02/14/24 1436 8     Pain Loc --      Pain Education --      Exclude from Growth Chart --    No data found.  Updated Vital Signs BP 120/75 (BP Location: Left Wrist)   Pulse 77   Temp 98.3 F (36.8 C) (Oral)   Resp 18   Wt 284 lb (128.8 kg)   LMP 02/07/2024 (Exact Date)   SpO2 100%   BMI 44.48 kg/m   Physical Exam Vitals and nursing note reviewed.  Constitutional:       General: She is not in acute distress.    Appearance: Normal appearance. She is not ill-appearing or toxic-appearing.  HENT:     Head:  Normocephalic and atraumatic.  Eyes:     General: No scleral icterus.       Right eye: No discharge.        Left eye: No discharge.     Conjunctiva/sclera: Conjunctivae normal.  Cardiovascular:     Rate and Rhythm: Normal rate.  Pulmonary:     Effort: Pulmonary effort is normal. No respiratory distress.  Musculoskeletal:     Cervical back: Neck supple.     Comments: LEFT KNEE: There is a large deep purple contusion of the anterior knee. Mild/moderate swelling of knee. Generalized tenderness of left anterior and posterior knee. Reduced ROM of knee.   Skin:    General: Skin is dry.  Neurological:     General: No focal deficit present.     Mental Status: She is alert. Mental status is at baseline.     Motor: No weakness.     Gait: Gait abnormal (using a walker).  Psychiatric:        Mood and Affect: Mood normal.        Behavior: Behavior normal.      UC Treatments / Results  Labs (all labs ordered are listed, but only abnormal results are displayed) Labs Reviewed - No data to display  EKG   Radiology DG Knee Complete 4 Views Left Result Date: 02/14/2024 CLINICAL DATA:  Left knee pain after fall. EXAM: LEFT KNEE - COMPLETE 4+ VIEW COMPARISON:  June 21, 2022. FINDINGS: No evidence of fracture, dislocation, or joint effusion. Minimal narrowing of medial joint space is noted. Continued presence of 2 metallic wirelike fragments in soft tissues medial to proximal tibia most consistent with foreign bodies. IMPRESSION: Minimal degenerative joint disease is noted medially. No acute abnormality seen. Stable metallic foreign body seen in soft tissues medial to proximal tibia. Electronically Signed   By: Lynwood Landy Raddle M.D.   On: 02/14/2024 15:30    Procedures Procedures (including critical care time)  Medications Ordered in UC Medications -  No data to display  Initial Impression / Assessment and Plan / UC Course  I have reviewed the triage vital signs and the nursing notes.  Pertinent labs & imaging results that were available during my care of the patient were reviewed by me and considered in my medical decision making (see chart for details).   33 year old female presents for left knee pain, swelling, contusion after falling directly onto the knee a couple days ago.  She has history of underlying pain of both knees due to arthritis.  Takes Voltaren oral medication, uses Tylenol  and has been using a walker.  X-ray of left knee obtained is negative for fracture.  Reviewed with patient.  Contusion/trauma to knee.  Advised to continue Voltaren, Tylenol , cryotherapy and try topical lidocaine  gel or patches.  Reviewed return precautions and discussed orthopedic follow-up.   Final Clinical Impressions(s) / UC Diagnoses   Final diagnoses:  Acute pain of left knee  Contusion of left knee, initial encounter     Discharge Instructions      - X-ray does not show any fractures. - Continue Voltaren and Tylenol .  Ice to the knee and elevate it.  Use an Ace wrap. -May use topical lidocaine  gel or patches - Continue to use walker or crutches until you are able to bear weight on the knee without difficulty. - Follow-up with orthopedics if symptoms persist or are not improving over the next couple weeks.     ED Prescriptions   None    I  have reviewed the PDMP during this encounter.   Arvis Jolan NOVAK, PA-C 02/14/24 1555

## 2024-02-14 NOTE — Discharge Instructions (Addendum)
-   X-ray does not show any fractures. - Continue Voltaren and Tylenol .  Ice to the knee and elevate it.  Use an Ace wrap. -May use topical lidocaine  gel or patches - Continue to use walker or crutches until you are able to bear weight on the knee without difficulty. - Follow-up with orthopedics if symptoms persist or are not improving over the next couple weeks.

## 2024-02-14 NOTE — Therapy (Signed)
 OUTPATIENT PHYSICAL THERAPY LOWER EXTREMITY TREATMENT   Patient [name] MRN: [id number] DOB:05/25/1990, 33 y.o., female Today's Date: 02/14/2024  END OF SESSION:  PT End of Session - 02/14/24 1206     Visit Number 6    Number of Visits 17    Date for Recertification  03/08/24    Authorization Type 1-2x/week x8 weeks    PT Start Time 1205    PT Stop Time 1250    PT Time Calculation (min) 45 min    Activity Tolerance Patient tolerated treatment well    Behavior During Therapy [facility] for tasks assessed/performed          Past Medical History:  Diagnosis Date   Acquired hypothyroidism    Allergy    Anxiety    Arthritis    ASCUS of cervix with negative high risk HPV    Asthma    Depression    Foreign body aspiration    GERD (gastroesophageal reflux disease)    Hallucinations 09/30/2014   Sizoaffective   Hematemesis 01/14/2022   Hyperlipidemia    Hyperprolactinemia    Hypertension    Hypoglycemia    Intentional ingestion of batteries 04/21/2016    2 AAA batteries and 1 thumb tack; intent to hurt herself/notes 04/21/2016   Malingering    Migraine without aura and without status migrainosus, not intractable    Peritoneal adhesions without obstruction 02/16/2022   Pharyngoesophageal dysphagia    PTSD (post-traumatic stress disorder)    Rheumatoid factor positive    Schizoaffective disorder, bipolar type (HCC)    Seizure (HCC) 01/16/2023   Self-inflicted injury 11/23/2017   SS-A antibody positive    Suicidal ideation 01/02/2018   Tardive dyskinesia 10/2014   recent onset   Thyroid  disease    Past Surgical History:  Procedure Laterality Date   ABDOMINAL SURGERY     years ago to remove foreign objects   APPENDECTOMY     BREAST EXCISIONAL BIOPSY Right 09/03/2007   surgical bx removed fibroadeoma   BREAST LUMPECTOMY Right    CHOLECYSTECTOMY  02/2022   COLONOSCOPY WITH PROPOFOL  N/A 09/10/2015   Procedure: COLONOSCOPY WITH PROPOFOL ;  Surgeon:  Gladis RAYMOND Mariner, MD;  Location: [facility] ENDOSCOPY;  Service: Endoscopy;  Laterality: N/A;   DIAGNOSTIC LAPAROSCOPY     ESOPHAGOGASTRODUODENOSCOPY N/A 11/28/2014   Procedure: ESOPHAGOGASTRODUODENOSCOPY (EGD);  Surgeon: Lamar ONEIDA Holmes, MD;  Location: [facility] ENDOSCOPY;  Service: Endoscopy;  Laterality: N/A;   ESOPHAGOGASTRODUODENOSCOPY N/A 02/21/2016   Procedure: ESOPHAGOGASTRODUODENOSCOPY (EGD);  Surgeon: Kavitha V Nandigam, MD;  Location: [facility] ENDOSCOPY;  Service: Endoscopy;  Laterality: N/A;   ESOPHAGOGASTRODUODENOSCOPY N/A 04/21/2016   Procedure: ESOPHAGOGASTRODUODENOSCOPY (EGD);  Surgeon: Lupita FORBES Commander, MD;  Location: [facility] ENDOSCOPY;  Service: Endoscopy;  Laterality: N/A;   ESOPHAGOGASTRODUODENOSCOPY N/A 05/06/2016   Procedure: ESOPHAGOGASTRODUODENOSCOPY (EGD);  Surgeon: Ruel Kung, MD;  Location: [facility] ENDOSCOPY;  Service: Gastroenterology;  Laterality: N/A;   ESOPHAGOGASTRODUODENOSCOPY N/A 06/13/2016   Procedure: ESOPHAGOGASTRODUODENOSCOPY (EGD);  Surgeon: Rogelia Copping, MD;  Location: [facility] ENDOSCOPY;  Service: Endoscopy;  Laterality: N/A;   ESOPHAGOGASTRODUODENOSCOPY N/A 01/14/2022   Procedure: ESOPHAGOGASTRODUODENOSCOPY (EGD);  Surgeon: Toledo, Ladell POUR, MD;  Location: [facility] ENDOSCOPY;  Service: Gastroenterology;  Laterality: N/A;   ESOPHAGOGASTRODUODENOSCOPY N/A 09/12/2023   Procedure: EGD (ESOPHAGOGASTRODUODENOSCOPY);  Surgeon: Toledo, Ladell POUR, MD;  Location: [facility] ENDOSCOPY;  Service: Gastroenterology;  Laterality: N/A;   ESOPHAGOGASTRODUODENOSCOPY (EGD) WITH PROPOFOL  N/A 02/29/2016   Procedure: ESOPHAGOGASTRODUODENOSCOPY (EGD) WITH PROPOFOL ;  Surgeon: Rogelia Copping, MD;  Location: [facility] ENDOSCOPY;  Service: Endoscopy;  Laterality:  N/A;   ESOPHAGOGASTRODUODENOSCOPY (EGD) WITH PROPOFOL  N/A 01/15/2018   Procedure: ESOPHAGOGASTRODUODENOSCOPY (EGD) WITH PROPOFOL ;  Surgeon: Shaaron Lamar HERO, MD;  Location: [facility] ENDO SUITE;  Service: Endoscopy;  Laterality: N/A;  retrieval forein body   LAPAROSCOPIC LYSIS  OF ADHESIONS  02/16/2022   Procedure: LAPAROSCOPIC LYSIS OF ADHESIONS;  Surgeon: Desiderio Schanz, MD;  Location: [facility] ORS;  Service: General;;   LAPAROTOMY N/A 09/12/2015   Procedure: EXPLORATORY LAPAROTOMY;  Surgeon: Charlie FORBES Fell, MD;  Location: [facility] ORS;  Service: General;  Laterality: N/A;   SIGMOIDOSCOPY N/A 09/12/2015   Procedure: [place];  Surgeon: Charlie FORBES Fell, MD;  Location: [facility] ORS;  Service: General;  Laterality: N/A;   WISDOM TOOTH EXTRACTION     Patient Active Problem List   Diagnosis Date Noted   Acute midline low back pain without sciatica 01/04/2024   Concussion with no loss of consciousness 11/03/2023   Mild intermittent asthma with exacerbation 06/17/2023   Viral URI with cough 06/17/2023   Amenorrhea 04/18/2023   Oropharyngeal dysphagia 04/18/2023   Drug overdose, intentional self-harm, initial encounter (HCC) 01/24/2023   Insomnia 01/22/2023   Drug overdose, multiple drugs, intentional self-harm, initial encounter (HCC) 01/21/2023   Mild intermittent asthma 01/21/2023   Seizure (HCC) 01/16/2023   Major depressive disorder, recurrent episode, severe, without psychosis (HCC) 11/11/2022   Intentional overdose (HCC) 11/08/2022   Morbid obesity (HCC) 09/30/2021   Acquired hypothyroidism 06/25/2021   Migraine without aura and without status migrainosus, not intractable 03/05/2021   Mild intermittent asthma without complication 12/01/2020   Gastroesophageal reflux disease 12/01/2020   Hypoglycemia 12/01/2020   Rheumatoid factor positive 12/09/2019   SS-A antibody positive 10/28/2019   Chronic pain of both knees 10/28/2019   Hyperprolactinemia 08/02/2019   Schizoaffective disorder, depressive type (HCC) 02/02/2018   Suicide attempt (HCC) 01/16/2018   Borderline personality disorder (HCC) 01/04/2018   PTSD (post-traumatic stress disorder) 01/03/2018   ASCUS of cervix with negative high risk HPV 08/28/2017   Malingering 05/06/2016    PCP: Dr. Justus,  MD  REFERRING PROVIDER: Dr. 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  No specific MOI.    Has seen podiatry; is wearing a walking boot.  Had an injection.  Referred to PT.  Having a lot of help from her husband because of her foot and her foot right.  Using a SPC- hoping to not use that long term  Agg: WB on R LE, prolonged standing and walk Allev: wearing a walking boot, steroid injection (minimal relief)  PERTINENT HISTORY: Early Aug- Aug 10th slipped and fell down her steps- strained her back  Also feels like her R low back is a little sore from using the boot for walking on  RLE  PAIN:  Are you having pain? Yes; R foot- 5/10; R low back 7/10   PRECAUTIONS: None  RED FLAGS: None   WEIGHT BEARING RESTRICTIONS: WBAT in boot presently  FALLS:  Has patient fallen in last 6 months? 1 fall recently down the steps in front of her house  LIVING ENVIRONMENT: Lives with: lives with their family and lives with their spouse Lives in: House/apartment Stairs: yes- 4 to enter home 1 tall at the top  in front with railing; and 3 to enter back with railing  Has following equipment at home: Single point cane  OCCUPATION: not presently working; enjoys taking walks for a hobby, enjoys sitting outside reading; doesn't drive (has seizures)  PLOF: Independent  PATIENT GOALS: Get out of the boot, stop using the cane, be able to put weight on her foot again without being limited by pain   NEXT MD VISIT: End of September  OBJECTIVE:  Note: Objective measures were completed at Evaluation unless otherwise noted.   PATIENT SURVEYS:   LEFS: 26/80   LOWER  EXTREMITY ROM:  Active ROM Right eval Left eval  Hip flexion    Hip extension    Hip abduction    Hip adduction    Hip internal rotation    Hip external rotation    Knee flexion    Knee extension    Ankle dorsiflexion -10*   Ankle plantarflexion 45*   Ankle inversion 20*   Ankle eversion 20    (Blank rows = not tested)  LOWER EXTREMITY MMT:  MMT Right eval Left eval  Hip flexion    Hip extension    Hip abduction 3+/5   Hip adduction    Hip internal rotation    Hip external rotation    Knee flexion    Knee extension    Ankle dorsiflexion 4/5   Ankle plantarflexion 3-/5*   Ankle inversion    Ankle eversion     (Blank rows = not tested)  LOWER EXTREMITY SPECIAL TESTS:  TTP along R plantar fascia and MT heads  FUNCTIONAL TESTS: Pt requires UE support for sit to stand  GAIT: pt amb with SPC in R hand upon arrival; instructed pt on sequencing/mechanics using SPC in L hand to assist R LE                                                                                                                               TREATMENT DATE: 02/14/24 Subjective: Pt reports she is overall feeling better; starting to tolerate putting more weight on her foot and standing/walking longer with less pain in her arch  Objective: Pain:1/10 heel  Manual Therapy:  STM R plantar fascia Ankle DF MWM x 15- not today Manual toe extension stretch, PF stretch: 30 second intervals x 4    Therapeutic Exercise: Nustep seat #12 x 10 min Standing gastroc stretch: 30 seconds x 4 R LE SLR in supine x10 ea LE Ankle AROM: PF/DF x 10, Inv/ev x 10- not today Standing hip abd x10, 2 sets, ea LE not today Standing hip ext x10, 2 sets, ea LE Marches in standing 2x10 Self PF mob with tennis ball x 1 min reviewed and discussed how to do in standing at home- not today Towel arch scrunches: 8 reps, then 8 heel slides- repeated 3 sets- reviewed for HEP  Therapeutic Activities: SLS x 5- with intermittent  UE support- 20-30 second intervals  R and L Standing heel raises 2x12 Standing hip extension x 20 ea LE LEFS: 31/80   PATIENT EDUCATION: PT POC/goals, tx plan, HEP instruction today, discussed how to perform STM with small ball under PF Education details: PT POC/goals Person educated: Patient Education method: Explanation Education comprehension: verbalized understanding  HOME EXERCISE PROGRAM: Access Code: [email or web address] URL: [email or web address] Date: 01/24/2024 Prepared by: Vernell Reges  Exercises - Seated Plantar Fascia Mobilization with Small Ball  - 2 x daily - 7 x weekly - 3 minutes hold - Seated Plantar Fascia Stretch  - 2 x daily - 7 x weekly - 3 sets - 30 seconds hold - Seated Heel Raise  - 2 x daily - 7 x weekly - 2 sets - 10 reps - Seated Toe Raise  - 2 x daily - 7 x weekly - 2 sets - 10 reps - Standing Hip Abduction with Counter Support  - 2 x daily - 7 x weekly - 2 sets - 10 reps - Towel Scrunches  - 1 x daily - 7 x weekly - 3 sets - 8-10 reps  ASSESSMENT:  CLINICAL IMPRESSION: Patient is a 33 y.o. F who was seen today for physical therapy treatment for R plantar fasciitis and LBP.  She has attended 3 PT treatment sessions so far and is making progress towards PT goals.  She was able to tolerate progression of SLS with increased time today; added SLR to address proximal LE/hip/quad mm weakness.  Still point tender along R medial plantar fascia. She would benefit from continuing to improve ankle/foot mobility and strength to promote optimal gait mechanics, to improve standing walking tolerance, and to reduce pain.  Recommend continued PT as she has likely not met max benefit from skilled PT interventions.  OBJECTIVE IMPAIRMENTS: Abnormal gait, decreased activity tolerance, decreased balance, difficulty walking, decreased ROM, decreased strength, and pain.   ACTIVITY LIMITATIONS: carrying, standing, squatting, stairs, and locomotion level  PARTICIPATION  LIMITATIONS: meal prep, cleaning, laundry, shopping, community activity, yard work, and church  PERSONAL FACTORS: Past/current experiences, Time since onset of injury/illness/exacerbation, and 1-2 comorbidities: LBP, see above PMH are also affecting patient's functional outcome.   REHAB POTENTIAL: Good  CLINICAL DECISION MAKING: Evolving/moderate complexity  EVALUATION COMPLEXITY: Moderate   GOALS: Goals reviewed with patient? Yes  SHORT TERM GOALS: Target date: 01/20/24 Pt will be able to perform HEP for R ankle/foot ROM/strengthening with proper technique and PT verbal/tactile cues needed <25% of the time Baseline: initiated at visit #2 Goal status: In progress   LONG TERM GOALS: Target date: 03/08/24  Imprive LEFS >10 points indicating pt able to perform her daily activities at home without being as significantly limited by R LE sx. Baseline: 26/80; 9/15: 31/80 Goal status: In progress  2.  Improve R ankle PF strength to >3+/5 to facilitate improved ability to amb without SPC and normal gait mechanics with R posterior chain activation with terminal stance Baseline: limited by pain 3-/5 MMT Goal status: In progress  3.  Pt will tolerate standing/walking x 15-20 min intervals for house chores, self care without wearing boot and <3/10 R foot pain Baseline: wearing boot during WB activities; 9/15: no longer in boot, standing 10-15 min intervals 3/10 pain Goal status: In progress   PLAN:  PT FREQUENCY: 2x/week  PT DURATION: 8 weeks  PLANNED INTERVENTIONS: 97110-Therapeutic exercises, 97530- Therapeutic activity, [id number]- Neuromuscular re-education, 97535- Self Care, and [id number]- Manual therapy  PLAN FOR NEXT SESSION: ankle/foot ROM/strengthening, manual therapy, continue progressing LE strengthening; recommend continued  PT   Vernell Reges, PT, DPT, OCS   Vernell FORBES Reges, PT 02/14/2024, 12:07 PM

## 2024-02-14 NOTE — ED Triage Notes (Signed)
 Patient states that she fell Monday morning. Injuring her left knee. Patient states that she tripped over a box.

## 2024-02-19 ENCOUNTER — Ambulatory Visit

## 2024-02-19 DIAGNOSIS — M722 Plantar fascial fibromatosis: Secondary | ICD-10-CM

## 2024-02-19 NOTE — Therapy (Signed)
 OUTPATIENT PHYSICAL THERAPY LOWER EXTREMITY TREATMENT   Patient [name] MRN: [id number] DOB:01/09/1991, 33 y.o., female Today's Date: 02/19/2024  END OF SESSION:  PT End of Session - 02/19/24 1159     Visit Number 7    Number of Visits 17    Date for Recertification  03/08/24    Authorization Type 1-2x/week x8 weeks    PT Start Time 1200    PT Stop Time 1244    PT Time Calculation (min) 44 min    Activity Tolerance Patient tolerated treatment well    Behavior During Therapy [facility] for tasks assessed/performed          Past Medical History:  Diagnosis Date   Acquired hypothyroidism    Allergy    Anxiety    Arthritis    ASCUS of cervix with negative high risk HPV    Asthma    Depression    Foreign body aspiration    GERD (gastroesophageal reflux disease)    Hallucinations 09/30/2014   Sizoaffective   Hematemesis 01/14/2022   Hyperlipidemia    Hyperprolactinemia    Hypertension    Hypoglycemia    Intentional ingestion of batteries 04/21/2016    2 AAA batteries and 1 thumb tack; intent to hurt herself/notes 04/21/2016   Malingering    Migraine without aura and without status migrainosus, not intractable    Peritoneal adhesions without obstruction 02/16/2022   Pharyngoesophageal dysphagia    PTSD (post-traumatic stress disorder)    Rheumatoid factor positive    Schizoaffective disorder, bipolar type (HCC)    Seizure (HCC) 01/16/2023   Self-inflicted injury 11/23/2017   SS-A antibody positive    Suicidal ideation 01/02/2018   Tardive dyskinesia 10/2014   recent onset   Thyroid  disease    Past Surgical History:  Procedure Laterality Date   ABDOMINAL SURGERY     years ago to remove foreign objects   APPENDECTOMY     BREAST EXCISIONAL BIOPSY Right 09/03/2007   surgical bx removed fibroadeoma   BREAST LUMPECTOMY Right    CHOLECYSTECTOMY  02/2022   COLONOSCOPY WITH PROPOFOL  N/A 09/10/2015   Procedure: COLONOSCOPY WITH PROPOFOL ;  Surgeon:  Gladis RAYMOND Mariner, MD;  Location: [facility] ENDOSCOPY;  Service: Endoscopy;  Laterality: N/A;   DIAGNOSTIC LAPAROSCOPY     ESOPHAGOGASTRODUODENOSCOPY N/A 11/28/2014   Procedure: ESOPHAGOGASTRODUODENOSCOPY (EGD);  Surgeon: Lamar ONEIDA Holmes, MD;  Location: [facility] ENDOSCOPY;  Service: Endoscopy;  Laterality: N/A;   ESOPHAGOGASTRODUODENOSCOPY N/A 02/21/2016   Procedure: ESOPHAGOGASTRODUODENOSCOPY (EGD);  Surgeon: Kavitha V Nandigam, MD;  Location: [facility] ENDOSCOPY;  Service: Endoscopy;  Laterality: N/A;   ESOPHAGOGASTRODUODENOSCOPY N/A 04/21/2016   Procedure: ESOPHAGOGASTRODUODENOSCOPY (EGD);  Surgeon: Lupita FORBES Commander, MD;  Location: [facility] ENDOSCOPY;  Service: Endoscopy;  Laterality: N/A;   ESOPHAGOGASTRODUODENOSCOPY N/A 05/06/2016   Procedure: ESOPHAGOGASTRODUODENOSCOPY (EGD);  Surgeon: Ruel Kung, MD;  Location: [facility] ENDOSCOPY;  Service: Gastroenterology;  Laterality: N/A;   ESOPHAGOGASTRODUODENOSCOPY N/A 06/13/2016   Procedure: ESOPHAGOGASTRODUODENOSCOPY (EGD);  Surgeon: Rogelia Copping, MD;  Location: [facility] ENDOSCOPY;  Service: Endoscopy;  Laterality: N/A;   ESOPHAGOGASTRODUODENOSCOPY N/A 01/14/2022   Procedure: ESOPHAGOGASTRODUODENOSCOPY (EGD);  Surgeon: Toledo, Ladell POUR, MD;  Location: [facility] ENDOSCOPY;  Service: Gastroenterology;  Laterality: N/A;   ESOPHAGOGASTRODUODENOSCOPY N/A 09/12/2023   Procedure: EGD (ESOPHAGOGASTRODUODENOSCOPY);  Surgeon: Toledo, Ladell POUR, MD;  Location: [facility] ENDOSCOPY;  Service: Gastroenterology;  Laterality: N/A;   ESOPHAGOGASTRODUODENOSCOPY (EGD) WITH PROPOFOL  N/A 02/29/2016   Procedure: ESOPHAGOGASTRODUODENOSCOPY (EGD) WITH PROPOFOL ;  Surgeon: Rogelia Copping, MD;  Location: [facility] ENDOSCOPY;  Service: Endoscopy;  Laterality:  N/A;   ESOPHAGOGASTRODUODENOSCOPY (EGD) WITH PROPOFOL  N/A 01/15/2018   Procedure: ESOPHAGOGASTRODUODENOSCOPY (EGD) WITH PROPOFOL ;  Surgeon: Shaaron Lamar HERO, MD;  Location: [facility] ENDO SUITE;  Service: Endoscopy;  Laterality: N/A;  retrieval forein body   LAPAROSCOPIC LYSIS  OF ADHESIONS  02/16/2022   Procedure: LAPAROSCOPIC LYSIS OF ADHESIONS;  Surgeon: Desiderio Schanz, MD;  Location: [facility] ORS;  Service: General;;   LAPAROTOMY N/A 09/12/2015   Procedure: EXPLORATORY LAPAROTOMY;  Surgeon: Charlie FORBES Fell, MD;  Location: [facility] ORS;  Service: General;  Laterality: N/A;   SIGMOIDOSCOPY N/A 09/12/2015   Procedure: [place];  Surgeon: Charlie FORBES Fell, MD;  Location: [facility] ORS;  Service: General;  Laterality: N/A;   WISDOM TOOTH EXTRACTION     Patient Active Problem List   Diagnosis Date Noted   Acute midline low back pain without sciatica 01/04/2024   Concussion with no loss of consciousness 11/03/2023   Mild intermittent asthma with exacerbation 06/17/2023   Viral URI with cough 06/17/2023   Amenorrhea 04/18/2023   Oropharyngeal dysphagia 04/18/2023   Drug overdose, intentional self-harm, initial encounter (HCC) 01/24/2023   Insomnia 01/22/2023   Drug overdose, multiple drugs, intentional self-harm, initial encounter (HCC) 01/21/2023   Mild intermittent asthma 01/21/2023   Seizure (HCC) 01/16/2023   Major depressive disorder, recurrent episode, severe, without psychosis (HCC) 11/11/2022   Intentional overdose (HCC) 11/08/2022   Morbid obesity (HCC) 09/30/2021   Acquired hypothyroidism 06/25/2021   Migraine without aura and without status migrainosus, not intractable 03/05/2021   Mild intermittent asthma without complication 12/01/2020   Gastroesophageal reflux disease 12/01/2020   Hypoglycemia 12/01/2020   Rheumatoid factor positive 12/09/2019   SS-A antibody positive 10/28/2019   Chronic pain of both knees 10/28/2019   Hyperprolactinemia 08/02/2019   Schizoaffective disorder, depressive type (HCC) 02/02/2018   Suicide attempt (HCC) 01/16/2018   Borderline personality disorder (HCC) 01/04/2018   PTSD (post-traumatic stress disorder) 01/03/2018   ASCUS of cervix with negative high risk HPV 08/28/2017   Malingering 05/06/2016    PCP: Dr. Justus,  MD  REFERRING PROVIDER: Dr. 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  No specific MOI.    Has seen podiatry; is wearing a walking boot.  Had an injection.  Referred to PT.  Having a lot of help from her husband because of her foot and her foot right.  Using a SPC- hoping to not use that long term  Agg: WB on R LE, prolonged standing and walk Allev: wearing a walking boot, steroid injection (minimal relief)  PERTINENT HISTORY: Early Aug- Aug 10th slipped and fell down her steps- strained her back  Also feels like her R low back is a little sore from using the boot for walking on  RLE  PAIN:  Are you having pain? Yes; R foot- 5/10; R low back 7/10   PRECAUTIONS: None  RED FLAGS: None   WEIGHT BEARING RESTRICTIONS: WBAT in boot presently  FALLS:  Has patient fallen in last 6 months? 1 fall recently down the steps in front of her house  LIVING ENVIRONMENT: Lives with: lives with their family and lives with their spouse Lives in: House/apartment Stairs: yes- 4 to enter home 1 tall at the top  in front with railing; and 3 to enter back with railing  Has following equipment at home: Single point cane  OCCUPATION: not presently working; enjoys taking walks for a hobby, enjoys sitting outside reading; doesn't drive (has seizures)  PLOF: Independent  PATIENT GOALS: Get out of the boot, stop using the cane, be able to put weight on her foot again without being limited by pain   NEXT MD VISIT: End of September  OBJECTIVE:  Note: Objective measures were completed at Evaluation unless otherwise noted.   PATIENT SURVEYS:   LEFS: 26/80   LOWER  EXTREMITY ROM:  Active ROM Right eval Left eval  Hip flexion    Hip extension    Hip abduction    Hip adduction    Hip internal rotation    Hip external rotation    Knee flexion    Knee extension    Ankle dorsiflexion -10*   Ankle plantarflexion 45*   Ankle inversion 20*   Ankle eversion 20    (Blank rows = not tested)  LOWER EXTREMITY MMT:  MMT Right eval Left eval  Hip flexion    Hip extension    Hip abduction 3+/5   Hip adduction    Hip internal rotation    Hip external rotation    Knee flexion    Knee extension    Ankle dorsiflexion 4/5   Ankle plantarflexion 3-/5*   Ankle inversion    Ankle eversion     (Blank rows = not tested)  LOWER EXTREMITY SPECIAL TESTS:  TTP along R plantar fascia and MT heads  FUNCTIONAL TESTS: Pt requires UE support for sit to stand  GAIT: pt amb with SPC in R hand upon arrival; instructed pt on sequencing/mechanics using SPC in L hand to assist R LE                                                                                                                               TREATMENT DATE: 02/19/24 Subjective: Pt reports she got an x-ray and it was (-) for knee fx on Wednesday.  She also saw her orthopedist for a consult.  Overall her knee is starting to feel a little better.  She is using ice regularly.  And has been using her walker.  Objective: Pain: 0/10 R heel, 4/10 L knee  Observation:decreased bruising along L patella compared to last week  Pt able to fully extend L knee, flexion AROM 100 deg in sitting, in hooklying only able to achieve 80 deg due to pain.    Unable to perform supine hooklying L SLR due to pain/weakness in L knee/quad mm  Not today: Manual Therapy:  STM R plantar fascia Ankle DF MWM x 15- not today Manual toe extension stretch, PF stretch: 30 second intervals x 4    Therapeutic Exercise: Nustep seat #12 x 10 min- not today Standing gastroc stretch: 30 seconds x 4 L LE SLR in supine x10  ea LE  (with assist from PT for L ) Ankle AROM: PF/DF x 10, Inv/ev x 10 R LE Standing hip abd x10, 2 sets, ea LE not today Standing hip ext x10, 2 sets, ea LE not today Marches in standing 2x10 not today Seated heel slides: R/L x10 ea L knee PROM x 5 with PT Self PF mob with tennis ball x 1 min reviewed and discussed how to do in standing at home- not today Towel arch scrunches: 8 reps, then 8 heel slides- repeated 3 sets- reviewed for HEP  Therapeutic Activities: SLS x 5- with intermittent UE support- 20-30 second intervals R LE Standing heel raises 2x12 with FWW  LEFS: 31/80   PATIENT EDUCATION: PT POC/goals, tx plan, HEP instruction today, discussed how to perform STM with small ball under PF Education details: PT POC/goals Person educated: Patient Education method: Explanation Education comprehension: verbalized understanding  HOME EXERCISE PROGRAM: Access Code: [email or web address] URL: [email or web address] Date: 01/24/2024 Prepared by: Vernell Reges  Exercises - Seated Plantar Fascia Mobilization with Small Ball  - 2 x daily - 7 x weekly - 3 minutes hold - Seated Plantar Fascia Stretch  - 2 x daily - 7 x weekly - 3 sets - 30 seconds hold - Seated Heel Raise  - 2 x daily - 7 x weekly - 2 sets - 10 reps - Seated Toe Raise  - 2 x daily - 7 x weekly - 2 sets - 10 reps - Standing Hip Abduction with Counter Support  - 2 x daily - 7 x weekly - 2 sets - 10 reps - Towel Scrunches  - 1 x daily - 7 x weekly - 3 sets - 8-10 reps  ASSESSMENT:  CLINICAL IMPRESSION: Patient had (-) x-ray of L knee at urgent care last week.  She arrived to today's session continuing to use FWW for ambulation; gait mechanics are improved as pt is tolerating weight bearing on L LE now.  Large bruise on anterior L knee is healing well.  Pt a little more tender on R plantar fascia during STM; pt does not notice increased pain during WB on R today.  Continued to modify tx and also added gentle knee PROM/AROM  exercises for R LE into tx plan today.  Overall, she should continue to benefit from skilled PT to address impairments and facilitate improved QOL.  OBJECTIVE IMPAIRMENTS: Abnormal gait, decreased activity tolerance, decreased balance, difficulty walking, decreased ROM, decreased strength, and pain.   ACTIVITY LIMITATIONS: carrying, standing, squatting, stairs, and locomotion level  PARTICIPATION LIMITATIONS: meal prep, cleaning, laundry, shopping, community activity, yard work, and church  PERSONAL FACTORS: Past/current experiences, Time since onset of injury/illness/exacerbation, and 1-2 comorbidities: LBP, see above PMH are also affecting patient's functional outcome.   REHAB POTENTIAL: Good  CLINICAL DECISION MAKING: Evolving/moderate complexity  EVALUATION COMPLEXITY: Moderate   GOALS: Goals reviewed with patient? Yes  SHORT TERM GOALS: Target date: 01/20/24 Pt will be able to perform HEP for R ankle/foot ROM/strengthening with proper technique and PT verbal/tactile cues needed <25% of the time Baseline: initiated at visit #2 Goal status: In progress   LONG TERM GOALS: Target date: 03/08/24  Imprive LEFS >10 points indicating pt able to perform her daily activities at home without being as significantly limited by R LE sx. Baseline: 26/80; 9/15: 31/80 Goal status: In progress  2.  Improve R ankle PF strength to >3+/5 to facilitate improved ability to amb without SPC and normal gait mechanics with R posterior chain activation with  terminal stance Baseline: limited by pain 3-/5 MMT Goal status: In progress  3.  Pt will tolerate standing/walking x 15-20 min intervals for house chores, self care without wearing boot and <3/10 R foot pain Baseline: wearing boot during WB activities; 9/15: no longer in boot, standing 10-15 min intervals 3/10 pain Goal status: In progress   PLAN:  PT FREQUENCY: 2x/week  PT DURATION: 8 weeks  PLANNED INTERVENTIONS: 97110-Therapeutic  exercises, 97530- Therapeutic activity, [id number]- Neuromuscular re-education, 97535- Self Care, and [id number]- Manual therapy  PLAN FOR NEXT SESSION: ankle/foot ROM/strengthening, manual therapy, continue progressing LE strengthening; recommend continued PT   Vernell Reges, PT, DPT, OCS   Vernell FORBES Reges, PT 02/19/2024, 2:53 PM

## 2024-02-21 ENCOUNTER — Ambulatory Visit

## 2024-02-21 DIAGNOSIS — M722 Plantar fascial fibromatosis: Secondary | ICD-10-CM | POA: Diagnosis not present

## 2024-02-21 NOTE — Therapy (Signed)
 OUTPATIENT PHYSICAL THERAPY LOWER EXTREMITY TREATMENT   Patient [name] MRN: [id number] DOB:1991-05-15, 33 y.o., female Today's Date: 02/21/2024  END OF SESSION:  PT End of Session - 02/21/24 1137     Visit Number 8    Number of Visits 17    Date for Recertification  03/08/24    Authorization Type 1-2x/week x8 weeks    PT Start Time 1150    PT Stop Time 1235    PT Time Calculation (min) 45 min    Activity Tolerance Patient tolerated treatment well    Behavior During Therapy [facility] for tasks assessed/performed          Past Medical History:  Diagnosis Date   Acquired hypothyroidism    Allergy    Anxiety    Arthritis    ASCUS of cervix with negative high risk HPV    Asthma    Depression    Foreign body aspiration    GERD (gastroesophageal reflux disease)    Hallucinations 09/30/2014   Sizoaffective   Hematemesis 01/14/2022   Hyperlipidemia    Hyperprolactinemia    Hypertension    Hypoglycemia    Intentional ingestion of batteries 04/21/2016    2 AAA batteries and 1 thumb tack; intent to hurt herself/notes 04/21/2016   Malingering    Migraine without aura and without status migrainosus, not intractable    Peritoneal adhesions without obstruction 02/16/2022   Pharyngoesophageal dysphagia    PTSD (post-traumatic stress disorder)    Rheumatoid factor positive    Schizoaffective disorder, bipolar type (HCC)    Seizure (HCC) 01/16/2023   Self-inflicted injury 11/23/2017   SS-A antibody positive    Suicidal ideation 01/02/2018   Tardive dyskinesia 10/2014   recent onset   Thyroid  disease    Past Surgical History:  Procedure Laterality Date   ABDOMINAL SURGERY     years ago to remove foreign objects   APPENDECTOMY     BREAST EXCISIONAL BIOPSY Right 09/03/2007   surgical bx removed fibroadeoma   BREAST LUMPECTOMY Right    CHOLECYSTECTOMY  02/2022   COLONOSCOPY WITH PROPOFOL  N/A 09/10/2015   Procedure: COLONOSCOPY WITH PROPOFOL ;  Surgeon:  Gladis RAYMOND Mariner, MD;  Location: [facility] ENDOSCOPY;  Service: Endoscopy;  Laterality: N/A;   DIAGNOSTIC LAPAROSCOPY     ESOPHAGOGASTRODUODENOSCOPY N/A 11/28/2014   Procedure: ESOPHAGOGASTRODUODENOSCOPY (EGD);  Surgeon: Lamar ONEIDA Holmes, MD;  Location: [facility] ENDOSCOPY;  Service: Endoscopy;  Laterality: N/A;   ESOPHAGOGASTRODUODENOSCOPY N/A 02/21/2016   Procedure: ESOPHAGOGASTRODUODENOSCOPY (EGD);  Surgeon: Kavitha V Nandigam, MD;  Location: [facility] ENDOSCOPY;  Service: Endoscopy;  Laterality: N/A;   ESOPHAGOGASTRODUODENOSCOPY N/A 04/21/2016   Procedure: ESOPHAGOGASTRODUODENOSCOPY (EGD);  Surgeon: Lupita FORBES Commander, MD;  Location: [facility] ENDOSCOPY;  Service: Endoscopy;  Laterality: N/A;   ESOPHAGOGASTRODUODENOSCOPY N/A 05/06/2016   Procedure: ESOPHAGOGASTRODUODENOSCOPY (EGD);  Surgeon: Ruel Kung, MD;  Location: [facility] ENDOSCOPY;  Service: Gastroenterology;  Laterality: N/A;   ESOPHAGOGASTRODUODENOSCOPY N/A 06/13/2016   Procedure: ESOPHAGOGASTRODUODENOSCOPY (EGD);  Surgeon: Rogelia Copping, MD;  Location: [facility] ENDOSCOPY;  Service: Endoscopy;  Laterality: N/A;   ESOPHAGOGASTRODUODENOSCOPY N/A 01/14/2022   Procedure: ESOPHAGOGASTRODUODENOSCOPY (EGD);  Surgeon: Toledo, Ladell POUR, MD;  Location: [facility] ENDOSCOPY;  Service: Gastroenterology;  Laterality: N/A;   ESOPHAGOGASTRODUODENOSCOPY N/A 09/12/2023   Procedure: EGD (ESOPHAGOGASTRODUODENOSCOPY);  Surgeon: Toledo, Ladell POUR, MD;  Location: [facility] ENDOSCOPY;  Service: Gastroenterology;  Laterality: N/A;   ESOPHAGOGASTRODUODENOSCOPY (EGD) WITH PROPOFOL  N/A 02/29/2016   Procedure: ESOPHAGOGASTRODUODENOSCOPY (EGD) WITH PROPOFOL ;  Surgeon: Rogelia Copping, MD;  Location: [facility] ENDOSCOPY;  Service: Endoscopy;  Laterality:  N/A;   ESOPHAGOGASTRODUODENOSCOPY (EGD) WITH PROPOFOL  N/A 01/15/2018   Procedure: ESOPHAGOGASTRODUODENOSCOPY (EGD) WITH PROPOFOL ;  Surgeon: Shaaron Lamar HERO, MD;  Location: [facility] ENDO SUITE;  Service: Endoscopy;  Laterality: N/A;  retrieval forein body   LAPAROSCOPIC LYSIS  OF ADHESIONS  02/16/2022   Procedure: LAPAROSCOPIC LYSIS OF ADHESIONS;  Surgeon: Desiderio Schanz, MD;  Location: [facility] ORS;  Service: General;;   LAPAROTOMY N/A 09/12/2015   Procedure: EXPLORATORY LAPAROTOMY;  Surgeon: Charlie FORBES Fell, MD;  Location: [facility] ORS;  Service: General;  Laterality: N/A;   SIGMOIDOSCOPY N/A 09/12/2015   Procedure: [place];  Surgeon: Charlie FORBES Fell, MD;  Location: [facility] ORS;  Service: General;  Laterality: N/A;   WISDOM TOOTH EXTRACTION     Patient Active Problem List   Diagnosis Date Noted   Acute midline low back pain without sciatica 01/04/2024   Concussion with no loss of consciousness 11/03/2023   Mild intermittent asthma with exacerbation 06/17/2023   Viral URI with cough 06/17/2023   Amenorrhea 04/18/2023   Oropharyngeal dysphagia 04/18/2023   Drug overdose, intentional self-harm, initial encounter (HCC) 01/24/2023   Insomnia 01/22/2023   Drug overdose, multiple drugs, intentional self-harm, initial encounter (HCC) 01/21/2023   Mild intermittent asthma 01/21/2023   Seizure (HCC) 01/16/2023   Major depressive disorder, recurrent episode, severe, without psychosis (HCC) 11/11/2022   Intentional overdose (HCC) 11/08/2022   Morbid obesity (HCC) 09/30/2021   Acquired hypothyroidism 06/25/2021   Migraine without aura and without status migrainosus, not intractable 03/05/2021   Mild intermittent asthma without complication 12/01/2020   Gastroesophageal reflux disease 12/01/2020   Hypoglycemia 12/01/2020   Rheumatoid factor positive 12/09/2019   SS-A antibody positive 10/28/2019   Chronic pain of both knees 10/28/2019   Hyperprolactinemia 08/02/2019   Schizoaffective disorder, depressive type (HCC) 02/02/2018   Suicide attempt (HCC) 01/16/2018   Borderline personality disorder (HCC) 01/04/2018   PTSD (post-traumatic stress disorder) 01/03/2018   ASCUS of cervix with negative high risk HPV 08/28/2017   Malingering 05/06/2016    PCP: Dr. Justus,  MD  REFERRING PROVIDER: Dr. 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  No specific MOI.    Has seen podiatry; is wearing a walking boot.  Had an injection.  Referred to PT.  Having a lot of help from her husband because of her foot and her foot right.  Using a SPC- hoping to not use that long term  Agg: WB on R LE, prolonged standing and walk Allev: wearing a walking boot, steroid injection (minimal relief)  PERTINENT HISTORY: Early Aug- Aug 10th slipped and fell down her steps- strained her back  Also feels like her R low back is a little sore from using the boot for walking on  RLE  PAIN:  Are you having pain? Yes; R foot- 5/10; R low back 7/10   PRECAUTIONS: None  RED FLAGS: None   WEIGHT BEARING RESTRICTIONS: WBAT in boot presently  FALLS:  Has patient fallen in last 6 months? 1 fall recently down the steps in front of her house  LIVING ENVIRONMENT: Lives with: lives with their family and lives with their spouse Lives in: House/apartment Stairs: yes- 4 to enter home 1 tall at the top  in front with railing; and 3 to enter back with railing  Has following equipment at home: Single point cane  OCCUPATION: not presently working; enjoys taking walks for a hobby, enjoys sitting outside reading; doesn't drive (has seizures)  PLOF: Independent  PATIENT GOALS: Get out of the boot, stop using the cane, be able to put weight on her foot again without being limited by pain   NEXT MD VISIT: End of September  OBJECTIVE:  Note: Objective measures were completed at Evaluation unless otherwise noted.   PATIENT SURVEYS:   LEFS: 26/80   LOWER  EXTREMITY ROM:  Active ROM Right eval Left eval  Hip flexion    Hip extension    Hip abduction    Hip adduction    Hip internal rotation    Hip external rotation    Knee flexion    Knee extension    Ankle dorsiflexion -10*   Ankle plantarflexion 45*   Ankle inversion 20*   Ankle eversion 20    (Blank rows = not tested)  LOWER EXTREMITY MMT:  MMT Right eval Left eval  Hip flexion    Hip extension    Hip abduction 3+/5   Hip adduction    Hip internal rotation    Hip external rotation    Knee flexion    Knee extension    Ankle dorsiflexion 4/5   Ankle plantarflexion 3-/5*   Ankle inversion    Ankle eversion     (Blank rows = not tested)  LOWER EXTREMITY SPECIAL TESTS:  TTP along R plantar fascia and MT heads  FUNCTIONAL TESTS: Pt requires UE support for sit to stand  GAIT: pt amb with SPC in R hand upon arrival; instructed pt on sequencing/mechanics using SPC in L hand to assist R LE                                                                                                                               TREATMENT DATE: 02/21/24 Subjective: Pt reports  overall her knee is starting to feel a little better.  She is using ice regularly.  Using a SPC instead of her walker today.  R arch pain is overall starting to feel better.  Objective: Pain: 0/10 R heel, 4/10 L knee  Observation:decreased bruising along L patella compared to last week, (-) pitting edema L distal LE  Manual Therapy:  STM R plantar fascia Ankle DF MWM x 15- not today Manual toe extension stretch, PF stretch: 30 second intervals x 4    Therapeutic Exercise: Nustep seat #12 x 10 min- not today Standing gastroc stretch: 30 seconds x 4 L LE SLR in supine x10 ea LE (with assist from PT for L )- not today Ankle AROM: PF/DF x 10, Inv/ev x 10 R LE Standing hip abd x10, 2 sets, ea LE not today Standing hip ext x10, 2 sets, ea LE not today Marches in standing 2x10 not today Seated  LAQ L LE x  15 Seated heel slides: R/L x10 ea L knee PROM x 5 with PT Self PF mob with tennis ball x 1 min reviewed and discussed how to do in standing at home- not today Towel arch scrunches: 8 reps, then 8 heel slides- repeated 3 sets- reviewed for HEP  Therapeutic Activities: SLS x 5- with intermittent UE support- 20-30 second intervals R LE Standing heel raises 2x12 with UE support Sit to stand x5, 2 sets Amb with SPC focusing on normal heel strike and upright posture x 3 min   LEFS: 31/80   PATIENT EDUCATION: PT POC/goals, tx plan, HEP instruction today, discussed how to perform STM with small ball under PF Education details: PT POC/goals Person educated: Patient Education method: Explanation Education comprehension: verbalized understanding  HOME EXERCISE PROGRAM: Access Code: [email or web address] URL: [email or web address] Date: 01/24/2024 Prepared by: Vernell Reges  Exercises - Seated Plantar Fascia Mobilization with Small Ball  - 2 x daily - 7 x weekly - 3 minutes hold - Seated Plantar Fascia Stretch  - 2 x daily - 7 x weekly - 3 sets - 30 seconds hold - Seated Heel Raise  - 2 x daily - 7 x weekly - 2 sets - 10 reps - Seated Toe Raise  - 2 x daily - 7 x weekly - 2 sets - 10 reps - Standing Hip Abduction with Counter Support  - 2 x daily - 7 x weekly - 2 sets - 10 reps - Towel Scrunches  - 1 x daily - 7 x weekly - 3 sets - 8-10 reps  ASSESSMENT:  CLINICAL IMPRESSION: Patient had (-) x-ray of L knee at urgent care last week.  She arrived to today's session continuing to use FWW for ambulation; gait mechanics are improved as pt is tolerating weight bearing on L LE now.  Large bruise on anterior L knee is healing well.  Pt a little more tender on R plantar fascia during STM; pt does not notice increased pain during WB on R today.  Able to progress therapeutic exercises for L knee and R foot again without c/o increased pain. Continued to modify tx and also added gentle knee  PROM/AROM exercises for R LE into tx plan today.  Overall, she should continue to benefit from skilled PT to address impairments and facilitate improved QOL.  OBJECTIVE IMPAIRMENTS: Abnormal gait, decreased activity tolerance, decreased balance, difficulty walking, decreased ROM, decreased strength, and pain.   ACTIVITY LIMITATIONS: carrying, standing, squatting, stairs, and locomotion level  PARTICIPATION LIMITATIONS: meal prep, cleaning, laundry, shopping, community activity, yard work, and church  PERSONAL FACTORS: Past/current experiences, Time since onset of injury/illness/exacerbation, and 1-2 comorbidities: LBP, see above PMH are also affecting patient's functional outcome.   REHAB POTENTIAL: Good  CLINICAL DECISION MAKING: Evolving/moderate complexity  EVALUATION COMPLEXITY: Moderate   GOALS: Goals reviewed with patient? Yes  SHORT TERM GOALS: Target date: 01/20/24 Pt will be able to perform HEP for R ankle/foot ROM/strengthening with proper technique and PT verbal/tactile cues needed <25% of the time Baseline: initiated at visit #2 Goal status: In progress   LONG TERM GOALS: Target date: 03/08/24  Imprive LEFS >10 points indicating pt able to perform her daily activities at home without being as significantly limited by R LE sx. Baseline: 26/80; 9/15: 31/80 Goal status: In progress  2.  Improve R ankle PF strength to >3+/5 to facilitate improved ability to amb without SPC and normal gait mechanics with R posterior chain activation with terminal stance  Baseline: limited by pain 3-/5 MMT Goal status: In progress  3.  Pt will tolerate standing/walking x 15-20 min intervals for house chores, self care without wearing boot and <3/10 R foot pain Baseline: wearing boot during WB activities; 9/15: no longer in boot, standing 10-15 min intervals 3/10 pain Goal status: In progress   PLAN:  PT FREQUENCY: 2x/week  PT DURATION: 8 weeks  PLANNED INTERVENTIONS:  [id number]-Therapeutic exercises, 97530- Therapeutic activity, [id number]- Neuromuscular re-education, 97535- Self Care, and [id number]- Manual therapy  PLAN FOR NEXT SESSION: ankle/foot ROM/strengthening, manual therapy, continue progressing LE strengthening; recommend continued PT   Vernell Reges, PT, DPT, OCS   Vernell FORBES Reges, PT 02/21/2024, 12:46 PM

## 2024-02-26 ENCOUNTER — Ambulatory Visit

## 2024-02-26 DIAGNOSIS — M722 Plantar fascial fibromatosis: Secondary | ICD-10-CM

## 2024-02-26 NOTE — Therapy (Signed)
 OUTPATIENT PHYSICAL THERAPY LOWER EXTREMITY TREATMENT   Patient [name] MRN: [id number] DOB:[birth year], 33 y.o., female Today's Date: 02/26/2024  END OF SESSION:  PT End of Session - 02/26/24 1230     Visit Number 9    Number of Visits 17    Date for Recertification  03/08/24    Authorization Type 1-2x/week x8 weeks    PT Start Time 1205    PT Stop Time 1245    PT Time Calculation (min) 40 min    Activity Tolerance Patient tolerated treatment well    Behavior During Therapy [facility] for tasks assessed/performed          Past Medical History:  Diagnosis Date   Acquired hypothyroidism    Allergy    Anxiety    Arthritis    ASCUS of cervix with negative high risk HPV    Asthma    Depression    Foreign body aspiration    GERD (gastroesophageal reflux disease)    Hallucinations 09/30/2014   Sizoaffective   Hematemesis 01/14/2022   Hyperlipidemia    Hyperprolactinemia    Hypertension    Hypoglycemia    Intentional ingestion of batteries 04/21/2016    2 AAA batteries and 1 thumb tack; intent to hurt herself/notes 04/21/2016   Malingering    Migraine without aura and without status migrainosus, not intractable    Peritoneal adhesions without obstruction 02/16/2022   Pharyngoesophageal dysphagia    PTSD (post-traumatic stress disorder)    Rheumatoid factor positive    Schizoaffective disorder, bipolar type (HCC)    Seizure (HCC) 01/16/2023   Self-inflicted injury 11/23/2017   SS-A antibody positive    Suicidal ideation 01/02/2018   Tardive dyskinesia 10/2014   recent onset   Thyroid  disease    Past Surgical History:  Procedure Laterality Date   ABDOMINAL SURGERY     years ago to remove foreign objects   APPENDECTOMY     BREAST EXCISIONAL BIOPSY Right 09/03/2007   surgical bx removed fibroadeoma   BREAST LUMPECTOMY Right    CHOLECYSTECTOMY  02/2022   COLONOSCOPY WITH PROPOFOL  N/A 09/10/2015   Procedure: COLONOSCOPY WITH PROPOFOL ;  Surgeon:  Gladis RAYMOND Mariner, MD;  Location: [facility] ENDOSCOPY;  Service: Endoscopy;  Laterality: N/A;   DIAGNOSTIC LAPAROSCOPY     ESOPHAGOGASTRODUODENOSCOPY N/A 11/28/2014   Procedure: ESOPHAGOGASTRODUODENOSCOPY (EGD);  Surgeon: Lamar ONEIDA Holmes, MD;  Location: [facility] ENDOSCOPY;  Service: Endoscopy;  Laterality: N/A;   ESOPHAGOGASTRODUODENOSCOPY N/A 02/21/2016   Procedure: ESOPHAGOGASTRODUODENOSCOPY (EGD);  Surgeon: Kavitha V Nandigam, MD;  Location: [facility] ENDOSCOPY;  Service: Endoscopy;  Laterality: N/A;   ESOPHAGOGASTRODUODENOSCOPY N/A 04/21/2016   Procedure: ESOPHAGOGASTRODUODENOSCOPY (EGD);  Surgeon: Lupita FORBES Commander, MD;  Location: [facility] ENDOSCOPY;  Service: Endoscopy;  Laterality: N/A;   ESOPHAGOGASTRODUODENOSCOPY N/A 05/06/2016   Procedure: ESOPHAGOGASTRODUODENOSCOPY (EGD);  Surgeon: Ruel Kung, MD;  Location: [facility] ENDOSCOPY;  Service: Gastroenterology;  Laterality: N/A;   ESOPHAGOGASTRODUODENOSCOPY N/A 06/13/2016   Procedure: ESOPHAGOGASTRODUODENOSCOPY (EGD);  Surgeon: Rogelia Copping, MD;  Location: [facility] ENDOSCOPY;  Service: Endoscopy;  Laterality: N/A;   ESOPHAGOGASTRODUODENOSCOPY N/A 01/14/2022   Procedure: ESOPHAGOGASTRODUODENOSCOPY (EGD);  Surgeon: Toledo, Ladell POUR, MD;  Location: [facility] ENDOSCOPY;  Service: Gastroenterology;  Laterality: N/A;   ESOPHAGOGASTRODUODENOSCOPY N/A 09/12/2023   Procedure: EGD (ESOPHAGOGASTRODUODENOSCOPY);  Surgeon: Toledo, Ladell POUR, MD;  Location: [facility] ENDOSCOPY;  Service: Gastroenterology;  Laterality: N/A;   ESOPHAGOGASTRODUODENOSCOPY (EGD) WITH PROPOFOL  N/A 02/29/2016   Procedure: ESOPHAGOGASTRODUODENOSCOPY (EGD) WITH PROPOFOL ;  Surgeon: Rogelia Copping, MD;  Location: [facility] ENDOSCOPY;  Service: Endoscopy;  Laterality:  N/A;   ESOPHAGOGASTRODUODENOSCOPY (EGD) WITH PROPOFOL  N/A 01/15/2018   Procedure: ESOPHAGOGASTRODUODENOSCOPY (EGD) WITH PROPOFOL ;  Surgeon: Shaaron Lamar HERO, MD;  Location: [facility] ENDO SUITE;  Service: Endoscopy;  Laterality: N/A;  retrieval forein body   LAPAROSCOPIC LYSIS  OF ADHESIONS  02/16/2022   Procedure: LAPAROSCOPIC LYSIS OF ADHESIONS;  Surgeon: Desiderio Schanz, MD;  Location: [facility] ORS;  Service: General;;   LAPAROTOMY N/A 09/12/2015   Procedure: EXPLORATORY LAPAROTOMY;  Surgeon: Charlie FORBES Fell, MD;  Location: [facility] ORS;  Service: General;  Laterality: N/A;   SIGMOIDOSCOPY N/A 09/12/2015   Procedure: [place];  Surgeon: Charlie FORBES Fell, MD;  Location: [facility] ORS;  Service: General;  Laterality: N/A;   WISDOM TOOTH EXTRACTION     Patient Active Problem List   Diagnosis Date Noted   Acute midline low back pain without sciatica 01/04/2024   Concussion with no loss of consciousness 11/03/2023   Mild intermittent asthma with exacerbation 06/17/2023   Viral URI with cough 06/17/2023   Amenorrhea 04/18/2023   Oropharyngeal dysphagia 04/18/2023   Drug overdose, intentional self-harm, initial encounter (HCC) 01/24/2023   Insomnia 01/22/2023   Drug overdose, multiple drugs, intentional self-harm, initial encounter (HCC) 01/21/2023   Mild intermittent asthma 01/21/2023   Seizure (HCC) 01/16/2023   Major depressive disorder, recurrent episode, severe, without psychosis (HCC) 11/11/2022   Intentional overdose (HCC) 11/08/2022   Morbid obesity (HCC) 09/30/2021   Acquired hypothyroidism 06/25/2021   Migraine without aura and without status migrainosus, not intractable 03/05/2021   Mild intermittent asthma without complication 12/01/2020   Gastroesophageal reflux disease 12/01/2020   Hypoglycemia 12/01/2020   Rheumatoid factor positive 12/09/2019   SS-A antibody positive 10/28/2019   Chronic pain of both knees 10/28/2019   Hyperprolactinemia 08/02/2019   Schizoaffective disorder, depressive type (HCC) 02/02/2018   Suicide attempt (HCC) 01/16/2018   Borderline personality disorder (HCC) 01/04/2018   PTSD (post-traumatic stress disorder) 01/03/2018   ASCUS of cervix with negative high risk HPV 08/28/2017   Malingering 05/06/2016    PCP: Dr. Justus,  MD  REFERRING PROVIDER: Dr. 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  No specific MOI.    Has seen podiatry; is wearing a walking boot.  Had an injection.  Referred to PT.  Having a lot of help from her husband because of her foot and her foot right.  Using a SPC- hoping to not use that long term  Agg: WB on R LE, prolonged standing and walk Allev: wearing a walking boot, steroid injection (minimal relief)  PERTINENT HISTORY: Early Aug- Aug 10th slipped and fell down her steps- strained her back  Also feels like her R low back is a little sore from using the boot for walking on  RLE  PAIN:  Are you having pain? Yes; R foot- 5/10; R low back 7/10   PRECAUTIONS: None  RED FLAGS: None   WEIGHT BEARING RESTRICTIONS: WBAT in boot presently  FALLS:  Has patient fallen in last 6 months? 1 fall recently down the steps in front of her house  LIVING ENVIRONMENT: Lives with: lives with their family and lives with their spouse Lives in: House/apartment Stairs: yes- 4 to enter home 1 tall at the top  in front with railing; and 3 to enter back with railing  Has following equipment at home: Single point cane  OCCUPATION: not presently working; enjoys taking walks for a hobby, enjoys sitting outside reading; doesn't drive (has seizures)  PLOF: Independent  PATIENT GOALS: Get out of the boot, stop using the cane, be able to put weight on her foot again without being limited by pain   NEXT MD VISIT: End of September  OBJECTIVE:  Note: Objective measures were completed at Evaluation unless otherwise noted.   PATIENT SURVEYS:   LEFS: 26/80   LOWER  EXTREMITY ROM:  Active ROM Right eval Left eval  Hip flexion    Hip extension    Hip abduction    Hip adduction    Hip internal rotation    Hip external rotation    Knee flexion    Knee extension    Ankle dorsiflexion -10*   Ankle plantarflexion 45*   Ankle inversion 20*   Ankle eversion 20    (Blank rows = not tested)  LOWER EXTREMITY MMT:  MMT Right eval Left eval  Hip flexion    Hip extension    Hip abduction 3+/5   Hip adduction    Hip internal rotation    Hip external rotation    Knee flexion    Knee extension    Ankle dorsiflexion 4/5   Ankle plantarflexion 3-/5*   Ankle inversion    Ankle eversion     (Blank rows = not tested)  LOWER EXTREMITY SPECIAL TESTS:  TTP along R plantar fascia and MT heads  FUNCTIONAL TESTS: Pt requires UE support for sit to stand  GAIT: pt amb with SPC in R hand upon arrival; instructed pt on sequencing/mechanics using SPC in L hand to assist R LE                                                                                                                               TREATMENT DATE: 02/26/24 Subjective: Pt reports her R foot and L knee are both feeling better this week.  She is no longer using the walker or the cane for ambulation.  Objective: Pain: 0/10 R heel, 0/10 L knee  Manual Therapy:  not today STM R plantar fascia Ankle DF MWM x 15- not today Manual toe extension stretch, PF stretch: 30 second intervals x 4    Therapeutic Exercise: Nustep seat #12 x 10 min- not today Standing gastroc stretch: 30 seconds x 4 L LE SLR in supine x10 ea LE (with assist from PT for L )- not today Ankle AROM: PF/DF x 10, Inv/ev x 10 R LE Standing hip abd x10, 2 sets, ea LE not today Standing hip ext x10, 2 sets, ea LE not today Marches in standing 2x15 3# Seated LAQ L LE x 15, 3 sets 3#  Self PF mob with tennis ball x 1 min reviewed and discussed how to do in standing  at home- not today Towel arch scrunches: 8 reps, then 8  heel slides- repeated 3 sets- reviewed for HEP  Therapeutic Activities: SLS x 5- with intermittent UE support- 20-30 second intervals R LE and L LE Standing heel raises 2x12 with UE support Sit to stand x5, 2 sets Tandem walk on blue line x 3 laps in parallel bars Lateral band monster walk 3 laps x 15 ft one way (band above ankles) Standing knee flexion hamstring curl x15, 3 sets 3#  LEFS: 31/80   PATIENT EDUCATION: PT POC/goals, tx plan, HEP instruction today, discussed how to perform STM with small ball under PF Education details: PT POC/goals Person educated: Patient Education method: Explanation Education comprehension: verbalized understanding  HOME EXERCISE PROGRAM: Access Code: [email or web address] URL: [email or web address] Date: 02/26/2024 Prepared by: Vernell Reges  Exercises - Seated Plantar Fascia Mobilization with Small Ball  - 2 x daily - 7 x weekly - 3 minutes hold - Seated Plantar Fascia Stretch  - 2 x daily - 7 x weekly - 3 sets - 30 seconds hold - Seated Heel Raise  - 2 x daily - 7 x weekly - 2 sets - 10 reps - Seated Toe Raise  - 2 x daily - 7 x weekly - 2 sets - 10 reps - Standing Hip Abduction with Counter Support  - 2 x daily - 7 x weekly - 2 sets - 10 reps - Towel Scrunches  - 1 x daily - 7 x weekly - 3 sets - 8-10 reps - Standing Heel Raises  - 1 x daily - 7 x weekly - 3 sets - 10 reps - Gastroc Stretch on Wall  - 1 x daily - 7 x weekly - 3 sets - 20 hold - Supine Active Straight Leg Raise  - 1 x daily - 7 x weekly - 3 sets - 5 reps - Seated Heel Slide  - 1 x daily - 7 x weekly - 3 sets - 10 reps - Seated Long Arc Quad  - 1 x daily - 7 x weekly - 3 sets - 10 reps - Side Stepping with Resistance at Ankles  - 1 x daily - 7 x weekly - 3 sets  ASSESSMENT:  CLINICAL IMPRESSION: Patient arrived today without using any assistive device for ambulation.  She was able to complete entire tx session today without onset of L knee pain or R foot pain.  R SL  balance is improving during SL stance; able to achieve 25 seconds now.  She would benefit from continued emphasis on improving strength in LE during CKC activities.  Overall, she should continue to benefit from skilled PT to address impairments and facilitate improved QOL.  OBJECTIVE IMPAIRMENTS: Abnormal gait, decreased activity tolerance, decreased balance, difficulty walking, decreased ROM, decreased strength, and pain.   ACTIVITY LIMITATIONS: carrying, standing, squatting, stairs, and locomotion level  PARTICIPATION LIMITATIONS: meal prep, cleaning, laundry, shopping, community activity, yard work, and church  PERSONAL FACTORS: Past/current experiences, Time since onset of injury/illness/exacerbation, and 1-2 comorbidities: LBP, see above PMH are also affecting patient's functional outcome.   REHAB POTENTIAL: Good  CLINICAL DECISION MAKING: Evolving/moderate complexity  EVALUATION COMPLEXITY: Moderate   GOALS: Goals reviewed with patient? Yes  SHORT TERM GOALS: Target date: 01/20/24 Pt will be able to perform HEP for R ankle/foot ROM/strengthening with proper technique and PT verbal/tactile cues needed <25% of the time Baseline: initiated at visit #2 Goal status: In progress   LONG TERM GOALS: Target date: 03/08/24  Imprive LEFS >  10 points indicating pt able to perform her daily activities at home without being as significantly limited by R LE sx. Baseline: 26/80; 9/15: 31/80 Goal status: In progress  2.  Improve R ankle PF strength to >3+/5 to facilitate improved ability to amb without SPC and normal gait mechanics with R posterior chain activation with terminal stance Baseline: limited by pain 3-/5 MMT Goal status: In progress  3.  Pt will tolerate standing/walking x 15-20 min intervals for house chores, self care without wearing boot and <3/10 R foot pain Baseline: wearing boot during WB activities; 9/15: no longer in boot, standing 10-15 min intervals 3/10 pain Goal  status: In progress   PLAN:  PT FREQUENCY: 2x/week  PT DURATION: 8 weeks  PLANNED INTERVENTIONS: 97110-Therapeutic exercises, 97530- Therapeutic activity, [id number]- Neuromuscular re-education, 97535- Self Care, and [id number]- Manual therapy  PLAN FOR NEXT SESSION: ankle/foot ROM/strengthening, manual therapy, continue progressing LE strengthening; recommend continued PT   Vernell Reges, PT, DPT, OCS   Vernell FORBES Reges, PT 02/26/2024, 12:31 PM

## 2024-02-28 ENCOUNTER — Emergency Department

## 2024-02-28 ENCOUNTER — Encounter

## 2024-02-28 ENCOUNTER — Other Ambulatory Visit: Payer: Self-pay

## 2024-02-28 ENCOUNTER — Emergency Department
Admission: EM | Admit: 2024-02-28 | Discharge: 2024-02-28 | Disposition: A | Discharge status: Home / Self Care | Attending: Emergency Medicine | Admitting: Emergency Medicine

## 2024-02-28 DIAGNOSIS — M629 Disorder of muscle, unspecified: Secondary | ICD-10-CM | POA: Diagnosis not present

## 2024-02-28 DIAGNOSIS — J4520 Mild intermittent asthma, uncomplicated: Secondary | ICD-10-CM | POA: Insufficient documentation

## 2024-02-28 DIAGNOSIS — M216X1 Other acquired deformities of right foot: Secondary | ICD-10-CM | POA: Diagnosis not present

## 2024-02-28 DIAGNOSIS — S99921A Unspecified injury of right foot, initial encounter: Secondary | ICD-10-CM | POA: Insufficient documentation

## 2024-02-28 DIAGNOSIS — X509XXA Other and unspecified overexertion or strenuous movements or postures, initial encounter: Secondary | ICD-10-CM | POA: Insufficient documentation

## 2024-02-28 DIAGNOSIS — Y92009 Unspecified place in unspecified non-institutional (private) residence as the place of occurrence of the external cause: Secondary | ICD-10-CM | POA: Insufficient documentation

## 2024-02-28 DIAGNOSIS — I10 Essential (primary) hypertension: Secondary | ICD-10-CM | POA: Diagnosis not present

## 2024-02-28 DIAGNOSIS — Z765 Malingerer [conscious simulation]: Secondary | ICD-10-CM | POA: Diagnosis not present

## 2024-02-28 MED ORDER — NAPROXEN 500 MG PO TBEC
500.0000 mg | DELAYED_RELEASE_TABLET | Freq: Two times a day (BID) | ORAL | 0 refills | Status: DC
Start: 2024-02-28 — End: 2024-02-28

## 2024-02-28 MED ORDER — PREDNISONE 50 MG PO TABS: 50.0000 mg | ORAL_TABLET | Freq: Every day | ORAL | 0 refills | Status: DC

## 2024-02-28 MED ORDER — KETOROLAC TROMETHAMINE 30 MG/ML IJ SOLN
30.0000 mg | Freq: Once | INTRAMUSCULAR | Status: AC
  Administered 2024-02-28: 30 mg via INTRAMUSCULAR
  Filled 2024-02-28: qty 1

## 2024-02-28 MED ORDER — PREDNISONE 50 MG PO TABS: ORAL_TABLET | ORAL | 0 refills | Status: DC

## 2024-02-28 MED ORDER — PREDNISONE 20 MG PO TABS
60.0000 mg | ORAL_TABLET | Freq: Once | ORAL | Status: AC
  Administered 2024-02-28: 60 mg via ORAL
  Filled 2024-02-28: qty 3

## 2024-02-28 NOTE — ED Triage Notes (Signed)
 [facility] reports pt coming from home for right foot pain. Pt stepped up into the house and heard a pop in her foot and felt a lot of pain and states it is getting worse. Pt has plantar fascitis in that foot.

## 2024-02-28 NOTE — Discharge Instructions (Addendum)
 Have been diagnosed with soft tissue injury of the right foot.  Please take Voltaren for pain.  You can take prednisone  1 tablet with breakfast for the next 3 days.  Please call your podiatrist and make an appointment for a follow-up.  Please check results of MRI on MyChart.  Please use the ice wrap and the boot as indicated by podiatrist..  Please come back to ED or go to your PCP if you have new symptoms symptoms worsen.  It was a pleasure to help you today, Lorane Sar, PA-C

## 2024-02-28 NOTE — ED Provider Notes (Signed)
 [facility] Provider Note    Event Date/Time   First MD Initiated Contact with Patient 02/28/24 1615     (approximate)   History   Foot Pain    HPI  Melanie Bell is a 33 y.o. female    with a past medical history of contusion of left knee, rheumatoid factor positive, PTSD, borderline personality disorder, schizoaffective disorder, migraines, hypertension, hyperlipidemia, chronic knee pain, pharyngitis due to strep, low back pain, right plantar fascial fibromatosis, tender fasciitis, foot pain,with no significant past medical history who presents to the ED complaining of right foot pain. According to the patient, she was at home this morning and stepped out on the stairs, heard a pop sound.  After the incident right plantar foot is very tender.  Patient patient took tramadol without resolution of the symptoms.  Patient is having PT for her right plantar fasciitis.  Patient is here by herself.     Patient Active Problem List   Diagnosis Date Noted   Nontraumatic tear of plantar fascia 02/28/2024   Acute midline low back pain without sciatica 01/04/2024   Concussion with no loss of consciousness 11/03/2023   Mild intermittent asthma with exacerbation 06/17/2023   Viral URI with cough 06/17/2023   Amenorrhea 04/18/2023   Oropharyngeal dysphagia 04/18/2023   Drug overdose, intentional self-harm, initial encounter (HCC) 01/24/2023   Insomnia 01/22/2023   Drug overdose, multiple drugs, intentional self-harm, initial encounter (HCC) 01/21/2023   Mild intermittent asthma 01/21/2023   Seizure (HCC) 01/16/2023   Major depressive disorder, recurrent episode, severe, without psychosis (HCC) 11/11/2022   Intentional overdose (HCC) 11/08/2022   Morbid obesity (HCC) 09/30/2021   Acquired hypothyroidism 06/25/2021   Migraine without aura and without status migrainosus, not intractable 03/05/2021   Mild intermittent asthma without complication 12/01/2020    Gastroesophageal reflux disease 12/01/2020   Hypoglycemia 12/01/2020   Rheumatoid factor positive 12/09/2019   SS-A antibody positive 10/28/2019   Chronic pain of both knees 10/28/2019   Hyperprolactinemia 08/02/2019   Schizoaffective disorder, depressive type (HCC) 02/02/2018   Suicide attempt (HCC) 01/16/2018   Borderline personality disorder (HCC) 01/04/2018   PTSD (post-traumatic stress disorder) 01/03/2018   ASCUS of cervix with negative high risk HPV 08/28/2017   Malingering 05/06/2016     ROS: Patient currently denies any vision changes, tinnitus, difficulty speaking, facial droop, sore throat, chest pain, shortness of breath, abdominal pain, nausea/vomiting/diarrhea, dysuria, or weakness/numbness/paresthesias in any extremity   Physical Exam   Triage Vital Signs: ED Triage Vitals [02/28/24 1538]  Encounter Vitals Group     BP 137/60     Girls Systolic BP Percentile      Girls Diastolic BP Percentile      Boys Systolic BP Percentile      Boys Diastolic BP Percentile      Pulse Rate 92     Resp 18     Temp 99 F (37.2 C)     Temp Source Oral     SpO2 97 %     Weight      Height      Head Circumference      Peak Flow      Pain Score (S) 8     Pain Loc      Pain Education      Exclude from Growth Chart     Most recent vital signs: Vitals:   02/28/24 1538  BP: 137/60  Pulse: 92  Resp: 18  Temp: 99 F (37.2  C)  SpO2: 97%     Physical Exam Vitals and nursing note reviewed.  Vital signs were normal during triage  General:          Awake, no distress.  CV:                  Good peripheral perfusion.  Resp:               Normal effort. no tachypnea Abd:                 No distention.  Soft nontender Other:              Right foot: Proximal plantar area: Skin is intact, no ecchymosis no hematomas.  Edema associated to tenderness to palpation.  Sensation decreased.  Decreased full toes ROM. ED Results / Procedures / Treatments   Labs (all labs ordered are  listed, but only abnormal results are displayed) Labs Reviewed - No data to display   RADIOLOGY I independently reviewed and interpreted imaging and agree with radiologists findings.      PROCEDURES:  Critical Care performed:   Procedures   MEDICATIONS ORDERED IN ED: Medications  ketorolac  (TORADOL ) 30 MG/ML injection 30 mg (30 mg Intramuscular Given 02/28/24 1641)  predniSONE  (DELTASONE ) tablet 60 mg (60 mg Oral Given 02/28/24 1756)   Clinical Course as of 02/28/24 2033  Wed Feb 28, 2024  1639 DG Foot 2 Views Right  No acute osseous abnormality. 2. Mild posterior calcaneal spurring.   [AE]  1717 I did consult podiatrist, who personally evaluated the patient and recommended MRI of the right foot and follow-up at his office. [AE]  2028 Reassessed the patient, she is feeling  better after having prednisone  and Toradol .  Patient is going to be discharged with prednisone , patient has at home Voltaren tablets.  MRI results are pending.  Patient is going to check on MyChart.  Patient is going to make a follow-up appointment with podiatrist who ordered the MRI.  Patient is agreeable with the plan [AE]    Clinical Course User Index [AE] Janit Kast, PA-C    IMPRESSION / MDM / ASSESSMENT AND PLAN / ED COURSE  I reviewed the triage vital signs and the nursing notes.  Differential diagnosis includes, but is not limited to, fasciitis, plantar fasciitis rupture, fracture  Patient's presentation is most consistent with acute complicated illness / injury requiring diagnostic workup.   Melanie Bell is a 33 y.o., female presents today with history of trauma on her right foot.  On a physical exam skin is intact, presence of edema, and tenderness to palpation in the occipital plantar area.  Pulses positive, sensation decreased, decreased full ROM of the toes. Plan Toradol  IM X-ray results ordered in triage Consult podiatrist Podiatrist on-call was consulted who recommended  MRI of the right foot and follow-up with him. Patient's diagnosis is consistent with right foot soft tissue injury. I independently reviewed and interpreted imaging and agree with radiologists findings ruling out fracture, MRI of the right heel is pending, patient is going to check results through my chart I did review the patient's allergies and medications.The patient is in stable and satisfactory condition for discharge home  Patient will be discharged home with prescriptions for prednisone . Patient is to follow up with podiatrist as needed or otherwise directed.  Patient endorses having Voltaren tablets at home, I did advise patient to keep taking them for pain.  Patient needs to use the ice wrap and  boot as recommended by podiatrist. Patient is given ED precautions to return to the ED for any worsening or new symptoms.   Discussed plan of care with patient, answered all of patient's questions, Patient agreeable to plan of care. Advised patient to take medications according to the instructions on the label. Discussed possible side effects of new medications. Patient verbalized understanding.  FINAL CLINICAL IMPRESSION(S) / ED DIAGNOSES   Final diagnoses:  Soft tissue injury of right foot, initial encounter     Rx / DC Orders   ED Discharge Orders          Ordered    predniSONE  (DELTASONE ) 50 MG tablet  Daily with breakfast,   Status:  Discontinued        02/28/24 1723    naproxen  (EC NAPROSYN ) 500 MG EC tablet  2 times daily with meals,   Status:  Discontinued        02/28/24 1723    predniSONE  (DELTASONE ) 50 MG tablet        02/28/24 2032             Note:  This document was prepared using Dragon voice recognition software and may include unintentional dictation errors.   Janit Kast, PA-C 02/28/24 2033    Suzanne Kirsch, MD 03/05/24 0151

## 2024-02-28 NOTE — Consult Note (Signed)
 PODIATRY CONSULTATION  NAME Melanie Bell MRN [id number] DOB 10/06/1990 DOA 02/28/2024   Reason for consult:  Chief Complaint  Patient presents with   Foot Pain    Attending/Consulting physician: Melanie Punter MD  History of present illness: 33 y.o. female arthritis of knees, PTSD, borderline personality disorder, schizoaffective disorder, asthma, GERD, migraines, hypothyroidism, previous drug overdoses/suicide attempts, and depression  Patient states she was walking up some steps today she felt a pop and severe pain in her right heel.  She was having difficulty walking.  She called her office who directed her to come to the emergency department.  She having trouble putting any weight on the right foot.  Does have a history of chronic plantar fasciitis.  Was seeing Melanie Bell for this.  Had prior injection.  Has been going to physical therapy.  Has a cam boot at home from our office.  Past Medical History:  Diagnosis Date   Acquired hypothyroidism    Allergy    Anxiety    Arthritis    ASCUS of cervix with negative high risk HPV    Asthma    Depression    Foreign body aspiration    GERD (gastroesophageal reflux disease)    Hallucinations 09/30/2014   Sizoaffective   Hematemesis 01/14/2022   Hyperlipidemia    Hyperprolactinemia    Hypertension    Hypoglycemia    Intentional ingestion of batteries 04/21/2016    2 AAA batteries and 1 thumb tack; intent to hurt herself/notes 04/21/2016   Malingering    Migraine without aura and without status migrainosus, not intractable    Peritoneal adhesions without obstruction 02/16/2022   Pharyngoesophageal dysphagia    PTSD (post-traumatic stress disorder)    Rheumatoid factor positive    Schizoaffective disorder, bipolar type (HCC)    Seizure (HCC) 01/16/2023   Self-inflicted injury 11/23/2017   SS-A antibody positive    Suicidal ideation 01/02/2018   Tardive dyskinesia 10/2014   recent onset   Thyroid  disease         Latest Ref Rng & Units 11/14/2023   12:32 PM 11/10/2023    4:18 PM 08/30/2023    8:53 PM  CBC  WBC 4.0 - 10.5 K/uL 4.0 - 10.5 K/uL 9.2    9.2  8.7  10.2   Hemoglobin 12.0 - 15.0 g/dL 87.9 - 84.9 g/dL 86.1    86.1  85.8  86.3   Hematocrit 36.0 - 46.0 % 36.0 - 46.0 % 41.7    42.2  41.9  41.1   Platelets 150 - 400 K/uL 150 - 400 K/uL 192    192  197  192        Latest Ref Rng & Units 11/14/2023   12:32 PM 11/10/2023    4:18 PM 08/30/2023    8:53 PM  BMP  Glucose 70 - 99 mg/dL 889  98  93   BUN 6 - 20 mg/dL 13  13  12    Creatinine 0.44 - 1.00 mg/dL 9.32  9.44  9.43   Sodium 135 - 145 mmol/L 140  139  138   Potassium 3.5 - 5.1 mmol/L 3.9  4.0  4.4   Chloride 98 - 111 mmol/L 108  109  109   CO2 22 - 32 mmol/L 26  21  21    Calcium 8.9 - 10.3 mg/dL 9.1  9.3  9.3       Physical Exam: Lower Extremity Exam  Significant right foot pain with palpation at the plantar  medial tubercle of the calcaneus.  The soft tissue is supple and able to be compressed no evidence of compartment syndrome  Able to slightly wiggle the toes on the right foot.  Sensation is diminished to light touch however it is intact and she can feel light touch  Edema noted of the rear foot and some bruising underneath the heel.   ASSESSMENT/PLAN OF CARE 33 y.o. female with PMHx significant for  arthritis of knees, PTSD, borderline personality disorder, schizoaffective disorder, asthma, GERD, migraines, hypothyroidism, previous drug overdoses/suicide attempts, and depression with acute right heel pain likely secondary to a plantar fascial partial or complete tear.   X-ray 2 views right foot: No fracture identified  -Discussed with patient and the PA that this is likely a right heel plantar fascial tear given chronic plantar fasciitis. -I recommend an MRI of the right heel which I ordered. -After completion of the MRI patient will be stable for discharge.  I recommend immobilization in a cam boot which she has at  home.  Also recommend Ace wrap from toes to mid calf for edema control and pain relief - Could also consider steroid Dosepak for pain control and edema reduction - WB status: Weightbearing as tolerated in cam boot which patient has at home. - Patient should call to follow-up with Melanie Bell her podiatrist in the [place] office 2 weeks from discharge    Thank you for the consult.  Please contact me directly with any questions or concerns.           Melanie Bell, DPM [facility] / [facility]    [address] [place], [place] [address]                Office [phone]  Fax [phone]

## 2024-02-29 ENCOUNTER — Telehealth: Payer: Self-pay

## 2024-02-29 NOTE — Transitions of Care (Post Inpatient/ED Visit) (Signed)
   02/29/2024  Name: Joelyn Lover MRN: [id number] DOB: [birth year]  Today's TOC FU Call Status: Today's TOC FU Call Status:: Unsuccessful Call (1st Attempt) Unsuccessful Call (1st Attempt) Date: 02/29/24  Attempted to reach the patient regarding the most recent Inpatient/ED visit.  Follow Up Plan: Additional outreach attempts will be made to reach the patient to complete the Transitions of Care (Post Inpatient/ED visit) call.   Kruti Horacek [place]  Primary Care & Sports Medicine [facility] CMA [facility] [address]  [place] [address] Office [phone]  Fax: [phone]

## 2024-03-01 ENCOUNTER — Ambulatory Visit

## 2024-03-01 DIAGNOSIS — M722 Plantar fascial fibromatosis: Secondary | ICD-10-CM | POA: Diagnosis not present

## 2024-03-01 NOTE — Transitions of Care (Post Inpatient/ED Visit) (Unsigned)
   03/01/2024  [name] MRN: [id number] DOB: 1990-12-10  Today's TOC FU Call Status: Today's TOC FU Call Status:: Unsuccessful Call (2nd Attempt) Unsuccessful Call (1st Attempt) Date: 02/29/24 Unsuccessful Call (2nd Attempt) Date: 03/01/24  Attempted to reach the patient regarding the most recent Inpatient/ED visit.  Follow Up Plan: Additional outreach attempts will be made to reach the patient to complete the Transitions of Care (Post Inpatient/ED visit) call.   Melanie Bell  Primary Care & Sports Medicine [facility] CMA [facility]), [facility] [address]  [place] [address] Office [phone]  Fax: [phone]

## 2024-03-01 NOTE — Therapy (Signed)
 OUTPATIENT PHYSICAL THERAPY LOWER EXTREMITY TREATMENT   Patient [name] MRN: [id number] DOB:Mar 18, 1991, 33 y.o., female Today's Date: 03/01/2024  END OF SESSION:  PT End of Session - 03/01/24 1024     Visit Number 10    Number of Visits 17    Date for Recertification  03/08/24    Authorization Type 1-2x/week x8 weeks    PT Start Time 1015    PT Stop Time 1100    PT Time Calculation (min) 45 min    Activity Tolerance Patient tolerated treatment well    Behavior During Therapy [facility] for tasks assessed/performed          Past Medical History:  Diagnosis Date   Acquired hypothyroidism    Allergy    Anxiety    Arthritis    ASCUS of cervix with negative high risk HPV    Asthma    Depression    Foreign body aspiration    GERD (gastroesophageal reflux disease)    Hallucinations 09/30/2014   Sizoaffective   Hematemesis 01/14/2022   Hyperlipidemia    Hyperprolactinemia    Hypertension    Hypoglycemia    Intentional ingestion of batteries 04/21/2016    2 AAA batteries and 1 thumb tack; intent to hurt herself/notes 04/21/2016   Malingering    Migraine without aura and without status migrainosus, not intractable    Peritoneal adhesions without obstruction 02/16/2022   Pharyngoesophageal dysphagia    PTSD (post-traumatic stress disorder)    Rheumatoid factor positive    Schizoaffective disorder, bipolar type (HCC)    Seizure (HCC) 01/16/2023   Self-inflicted injury 11/23/2017   SS-A antibody positive    Suicidal ideation 01/02/2018   Tardive dyskinesia 10/2014   recent onset   Thyroid  disease    Past Surgical History:  Procedure Laterality Date   ABDOMINAL SURGERY     years ago to remove foreign objects   APPENDECTOMY     BREAST EXCISIONAL BIOPSY Right 09/03/2007   surgical bx removed fibroadeoma   BREAST LUMPECTOMY Right    CHOLECYSTECTOMY  02/2022   COLONOSCOPY WITH PROPOFOL  N/A 09/10/2015   Procedure: COLONOSCOPY WITH PROPOFOL ;  Surgeon:  Gladis RAYMOND Mariner, MD;  Location: [facility] ENDOSCOPY;  Service: Endoscopy;  Laterality: N/A;   DIAGNOSTIC LAPAROSCOPY     ESOPHAGOGASTRODUODENOSCOPY N/A 11/28/2014   Procedure: ESOPHAGOGASTRODUODENOSCOPY (EGD);  Surgeon: Lamar ONEIDA Holmes, MD;  Location: [facility] ENDOSCOPY;  Service: Endoscopy;  Laterality: N/A;   ESOPHAGOGASTRODUODENOSCOPY N/A 02/21/2016   Procedure: ESOPHAGOGASTRODUODENOSCOPY (EGD);  Surgeon: Kavitha V Nandigam, MD;  Location: [facility] ENDOSCOPY;  Service: Endoscopy;  Laterality: N/A;   ESOPHAGOGASTRODUODENOSCOPY N/A 04/21/2016   Procedure: ESOPHAGOGASTRODUODENOSCOPY (EGD);  Surgeon: Lupita FORBES Commander, MD;  Location: [facility] ENDOSCOPY;  Service: Endoscopy;  Laterality: N/A;   ESOPHAGOGASTRODUODENOSCOPY N/A 05/06/2016   Procedure: ESOPHAGOGASTRODUODENOSCOPY (EGD);  Surgeon: Ruel Kung, MD;  Location: [facility] ENDOSCOPY;  Service: Gastroenterology;  Laterality: N/A;   ESOPHAGOGASTRODUODENOSCOPY N/A 06/13/2016   Procedure: ESOPHAGOGASTRODUODENOSCOPY (EGD);  Surgeon: Rogelia Copping, MD;  Location: [facility] ENDOSCOPY;  Service: Endoscopy;  Laterality: N/A;   ESOPHAGOGASTRODUODENOSCOPY N/A 01/14/2022   Procedure: ESOPHAGOGASTRODUODENOSCOPY (EGD);  Surgeon: Toledo, Ladell POUR, MD;  Location: [facility] ENDOSCOPY;  Service: Gastroenterology;  Laterality: N/A;   ESOPHAGOGASTRODUODENOSCOPY N/A 09/12/2023   Procedure: EGD (ESOPHAGOGASTRODUODENOSCOPY);  Surgeon: Toledo, Ladell POUR, MD;  Location: [facility] ENDOSCOPY;  Service: Gastroenterology;  Laterality: N/A;   ESOPHAGOGASTRODUODENOSCOPY (EGD) WITH PROPOFOL  N/A 02/29/2016   Procedure: ESOPHAGOGASTRODUODENOSCOPY (EGD) WITH PROPOFOL ;  Surgeon: Rogelia Copping, MD;  Location: [facility] ENDOSCOPY;  Service: Endoscopy;  Laterality:  N/A;   ESOPHAGOGASTRODUODENOSCOPY (EGD) WITH PROPOFOL  N/A 01/15/2018   Procedure: ESOPHAGOGASTRODUODENOSCOPY (EGD) WITH PROPOFOL ;  Surgeon: Shaaron Lamar HERO, MD;  Location: [facility] ENDO [facility];  Service: Endoscopy;  Laterality: N/A;  retrieval forein body   LAPAROSCOPIC LYSIS  OF ADHESIONS  02/16/2022   Procedure: LAPAROSCOPIC LYSIS OF ADHESIONS;  Surgeon: Desiderio Schanz, MD;  Location: [facility] ORS;  Service: General;;   LAPAROTOMY N/A 09/12/2015   Procedure: EXPLORATORY LAPAROTOMY;  Surgeon: Charlie FORBES Fell, MD;  Location: [facility] ORS;  Service: General;  Laterality: N/A;   SIGMOIDOSCOPY N/A 09/12/2015   Procedure: [place];  Surgeon: Charlie FORBES Fell, MD;  Location: [facility] ORS;  Service: General;  Laterality: N/A;   WISDOM TOOTH EXTRACTION     Patient Active Problem List   Diagnosis Date Noted   Nontraumatic tear of plantar fascia 02/28/2024   Acute midline low back pain without sciatica 01/04/2024   Concussion with no loss of consciousness 11/03/2023   Mild intermittent asthma with exacerbation 06/17/2023   Viral URI with cough 06/17/2023   Amenorrhea 04/18/2023   Oropharyngeal dysphagia 04/18/2023   Drug overdose, intentional self-harm, initial encounter (HCC) 01/24/2023   Insomnia 01/22/2023   Drug overdose, multiple drugs, intentional self-harm, initial encounter (HCC) 01/21/2023   Mild intermittent asthma 01/21/2023   Seizure (HCC) 01/16/2023   Major depressive disorder, recurrent episode, severe, without psychosis (HCC) 11/11/2022   Intentional overdose (HCC) 11/08/2022   Morbid obesity (HCC) 09/30/2021   Acquired hypothyroidism 06/25/2021   Migraine without aura and without status migrainosus, not intractable 03/05/2021   Mild intermittent asthma without complication 12/01/2020   Gastroesophageal reflux disease 12/01/2020   Hypoglycemia 12/01/2020   Rheumatoid factor positive 12/09/2019   SS-A antibody positive 10/28/2019   Chronic pain of both knees 10/28/2019   Hyperprolactinemia 08/02/2019   Schizoaffective disorder, depressive type (HCC) 02/02/2018   Suicide attempt (HCC) 01/16/2018   Borderline personality disorder (HCC) 01/04/2018   PTSD (post-traumatic stress disorder) 01/03/2018   ASCUS of cervix with negative high risk HPV 08/28/2017    Malingering 05/06/2016    PCP: Dr. Justus, MD  REFERRING PROVIDER: Dr. 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  No specific MOI.    Has seen podiatry; is wearing a walking boot.  Had an injection.  Referred to PT.  Having a lot of help from her husband because of her foot and her foot right.  Using a SPC- hoping to not use that long term  Agg: WB on R LE, prolonged standing and walk Allev: wearing a walking boot, steroid injection (minimal relief)  PERTINENT HISTORY: Early Aug- Aug 10th slipped and fell down her steps- strained her back  Also feels like her R low back is a little sore from using the boot for walking on  RLE  PAIN:  Are you having pain? Yes; R foot- 5/10; R low back 7/10   PRECAUTIONS: None  RED FLAGS: None   WEIGHT BEARING RESTRICTIONS: WBAT in boot presently  FALLS:  Has patient fallen in last 6 months? 1 fall recently down the steps in front of her house  LIVING ENVIRONMENT: Lives with: lives with their family and lives with their spouse Lives in: House/apartment Stairs: yes- 4  to enter home 1 tall at the top in front with railing; and 3 to enter back with railing  Has following equipment at home: Single point cane  OCCUPATION: not presently working; enjoys taking walks for a hobby, enjoys sitting outside reading; doesn't drive (has seizures)  PLOF: Independent  PATIENT GOALS: Get out of the boot, stop using the cane, be able to put weight on her foot again without being limited by pain   NEXT MD VISIT: End of September  OBJECTIVE:  Note: Objective measures were completed at Evaluation unless otherwise  noted.   PATIENT SURVEYS:   LEFS: 26/80   LOWER EXTREMITY ROM:  Active ROM Right eval Left eval  Hip flexion    Hip extension    Hip abduction    Hip adduction    Hip internal rotation    Hip external rotation    Knee flexion    Knee extension    Ankle dorsiflexion -10*   Ankle plantarflexion 45*   Ankle inversion 20*   Ankle eversion 20    (Blank rows = not tested)  LOWER EXTREMITY MMT:  MMT Right eval Left eval  Hip flexion    Hip extension    Hip abduction 3+/5   Hip adduction    Hip internal rotation    Hip external rotation    Knee flexion    Knee extension    Ankle dorsiflexion 4/5   Ankle plantarflexion 3-/5*   Ankle inversion    Ankle eversion     (Blank rows = not tested)  LOWER EXTREMITY SPECIAL TESTS:  TTP along R plantar fascia and MT heads  FUNCTIONAL TESTS: Pt requires UE support for sit to stand  GAIT: pt amb with SPC in R hand upon arrival; instructed pt on sequencing/mechanics using SPC in L hand to assist R LE                                                                                                                               TREATMENT DATE: 03/01/24 Subjective: Pt reports she stepped on her step at home (pushing off of R foot) and felt a pop and had pain in her arch.  Went to ER and had it evaluated and MRI shows a partial tear of plantar fascia.  She is back in the boot and using the walker.  Has a follow up 03/20/24.    Objective: Pain: 6/10 R heel, 0/10 L knee (+) edema noted at proximal plantar fascia and TTP along this area Pt using ace wrap for compression inside her boot and using the FWW  Per chart review: MRI 02/28/24 IMPRESSION: 1. Thickening of the proximal central cord of the plantar fascia with surrounding soft tissue edema and mild attenuation near the inferior calcaneal insertion, at the level of the inferior calcaneal spur. These findings are most compatible with acute plantar fasciitis with suspected  partial-thickness tear at the calcaneal insertion. 2. No acute osseous  abnormality.  Manual Therapy:  not today STM R plantar fascia Ankle DF MWM x 15- not today Manual toe extension stretch, PF stretch: 30 second intervals x 4   Therapeutic Exercise: Gentle AROM ankle DF, PF, inv, ev x10  SLR 2x10  Cold pack x 10 min at end with leg elevated (foot above heart)  Not today Nustep seat #12 x 10 min Standing gastroc stretch: 30 seconds x 4 L LE SLR in supine x10 ea LE (with assist from PT for L )- not today Ankle AROM: PF/DF x 10, Inv/ev x 10 R LE Standing hip abd x10, 2 sets, ea LE not today Standing hip ext x10, 2 sets, ea LE not today Marches in standing 2x15 3# Seated LAQ L LE x 15, 3 sets 3# Self PF mob with tennis ball x 1 min reviewed and discussed how to do in standing at home- not today Towel arch scrunches: 8 reps, then 8 heel slides- repeated 3 sets- reviewed for HEP  Therapeutic Activities: Not today SLS x 5- with intermittent UE support- 20-30 second intervals R LE and L LE Standing heel raises 2x12 with UE support Sit to stand x5, 2 sets Tandem walk on blue line x 3 laps in parallel bars Lateral band monster walk 3 laps x 15 ft one way (band above ankles) Standing knee flexion hamstring curl x15, 3 sets 3#  LEFS: 31/80   PATIENT EDUCATION: Pt education for acute injury management at home with elevation, compression for edema and gentle AROM x10 ea direction x2/day, practiced wrapping with ace wrap today reviewed appropriate technique and how to make sure it's not too tight Education details: PT POC/goals Person educated: Patient Education method: Explanation Education comprehension: verbalized understanding  HOME EXERCISE PROGRAM: Access Code: [email or web address] URL: [email or web address] Date: 02/26/2024 Prepared by: Vernell Reges  Exercises - Seated Plantar Fascia Mobilization with Small Ball  - 2 x daily - 7 x weekly - 3 minutes hold - Seated  Plantar Fascia Stretch  - 2 x daily - 7 x weekly - 3 sets - 30 seconds hold - Seated Heel Raise  - 2 x daily - 7 x weekly - 2 sets - 10 reps - Seated Toe Raise  - 2 x daily - 7 x weekly - 2 sets - 10 reps - Standing Hip Abduction with Counter Support  - 2 x daily - 7 x weekly - 2 sets - 10 reps - Towel Scrunches  - 1 x daily - 7 x weekly - 3 sets - 8-10 reps - Standing Heel Raises  - 1 x daily - 7 x weekly - 3 sets - 10 reps - Gastroc Stretch on Wall  - 1 x daily - 7 x weekly - 3 sets - 20 hold - Supine Active Straight Leg Raise  - 1 x daily - 7 x weekly - 3 sets - 5 reps - Seated Heel Slide  - 1 x daily - 7 x weekly - 3 sets - 10 reps - Seated Long Arc Quad  - 1 x daily - 7 x weekly - 3 sets - 10 reps - Side Stepping with Resistance at Ankles  - 1 x daily - 7 x weekly - 3 sets  ASSESSMENT:  CLINICAL IMPRESSION: Patient arrived today after a recent acute reinjury of her R plantar fascia.  MRI when she was in the ER last week shows partial tear of proximal calcaneal insertion; area is swollen today too.  Modified tx to focus on edema/pain  control and gentle ankle ROM/proximal LE strength.  Will continue to adjust/modify tx accordingly based on tissue healing times.  Overall, she should continue to benefit from skilled PT to address impairments and facilitate improved QOL.  OBJECTIVE IMPAIRMENTS: Abnormal gait, decreased activity tolerance, decreased balance, difficulty walking, decreased ROM, decreased strength, and pain.   ACTIVITY LIMITATIONS: carrying, standing, squatting, stairs, and locomotion level  PARTICIPATION LIMITATIONS: meal prep, cleaning, laundry, shopping, community activity, yard work, and church  PERSONAL FACTORS: Past/current experiences, Time since onset of injury/illness/exacerbation, and 1-2 comorbidities: LBP, see above PMH are also affecting patient's functional outcome.   REHAB POTENTIAL: Good  CLINICAL DECISION MAKING: Evolving/moderate complexity  EVALUATION  COMPLEXITY: Moderate   GOALS: Goals reviewed with patient? Yes  SHORT TERM GOALS: Target date: 01/20/24 Pt will be able to perform HEP for R ankle/foot ROM/strengthening with proper technique and PT verbal/tactile cues needed <25% of the time Baseline: initiated at visit #2 Goal status: In progress   LONG TERM GOALS: Target date: 03/08/24  Imprive LEFS >10 points indicating pt able to perform her daily activities at home without being as significantly limited by R LE sx. Baseline: 26/80; 9/15: 31/80 Goal status: In progress  2.  Improve R ankle PF strength to >3+/5 to facilitate improved ability to amb without SPC and normal gait mechanics with R posterior chain activation with terminal stance Baseline: limited by pain 3-/5 MMT Goal status: In progress  3.  Pt will tolerate standing/walking x 15-20 min intervals for house chores, self care without wearing boot and <3/10 R foot pain Baseline: wearing boot during WB activities; 9/15: no longer in boot, standing 10-15 min intervals 3/10 pain Goal status: In progress   PLAN:  PT FREQUENCY: 2x/week  PT DURATION: 8 weeks  PLANNED INTERVENTIONS: 97110-Therapeutic exercises, 97530- Therapeutic activity, [id number]- Neuromuscular re-education, 97535- Self Care, and 02859- Manual therapy  PLAN FOR NEXT SESSION: ankle/foot ROM/strengthening, manual therapy, continue progressing LE strengthening; recommend continued PT   Vernell Reges, PT, DPT, OCS   Vernell FORBES Reges, PT 03/01/2024, 10:25 AM

## 2024-03-04 ENCOUNTER — Ambulatory Visit

## 2024-03-05 NOTE — Transitions of Care (Post Inpatient/ED Visit) (Signed)
   03/05/2024  [name] MRN: [id number] DOB: 02-01-1991  Today's TOC FU Call Status: Today's TOC FU Call Status:: Unsuccessful Call (3rd Attempt) Unsuccessful Call (1st Attempt) Date: 02/29/24 Unsuccessful Call (2nd Attempt) Date: 03/01/24 Unsuccessful Call (3rd Attempt) Date: 03/05/24  Attempted to reach the patient regarding the most recent Inpatient/ED visit.  Follow Up Plan: No further outreach attempts will be made at this time. We have been unable to contact the patient.  Yoon Barca [place]  Primary Care & Sports Medicine [facility] CMA [facility] [address]  [place] [address] Office [phone]  Fax: [phone]

## 2024-03-06 ENCOUNTER — Ambulatory Visit

## 2024-03-06 DIAGNOSIS — M722 Plantar fascial fibromatosis: Secondary | ICD-10-CM | POA: Diagnosis not present

## 2024-03-06 NOTE — Therapy (Signed)
 OUTPATIENT PHYSICAL THERAPY LOWER EXTREMITY TREATMENT/PROGRESS NOTE/RE-CERTIFICATION through 04/05/24   Patient [name] MRN: [id number] DOB:1990/06/17, 33 y.o., female Today's Date: 03/06/2024  END OF SESSION:  PT End of Session - 03/06/24 1122     Visit Number 11    Number of Visits 17    Date for Recertification  03/08/24    Authorization Type 1-2x/week x8 weeks    PT Start Time 1135    PT Stop Time 1220    PT Time Calculation (min) 45 min    Activity Tolerance Patient tolerated treatment well    Behavior During Therapy [facility] for tasks assessed/performed          Past Medical History:  Diagnosis Date   Acquired hypothyroidism    Allergy    Anxiety    Arthritis    ASCUS of cervix with negative high risk HPV    Asthma    Depression    Foreign body aspiration    GERD (gastroesophageal reflux disease)    Hallucinations 09/30/2014   Sizoaffective   Hematemesis 01/14/2022   Hyperlipidemia    Hyperprolactinemia    Hypertension    Hypoglycemia    Intentional ingestion of batteries 04/21/2016    2 AAA batteries and 1 thumb tack; intent to hurt herself/notes 04/21/2016   Malingering    Migraine without aura and without status migrainosus, not intractable    Peritoneal adhesions without obstruction 02/16/2022   Pharyngoesophageal dysphagia    PTSD (post-traumatic stress disorder)    Rheumatoid factor positive    Schizoaffective disorder, bipolar type (HCC)    Seizure (HCC) 01/16/2023   Self-inflicted injury 11/23/2017   SS-A antibody positive    Suicidal ideation 01/02/2018   Tardive dyskinesia 10/2014   recent onset   Thyroid  disease    Past Surgical History:  Procedure Laterality Date   ABDOMINAL SURGERY     years ago to remove foreign objects   APPENDECTOMY     BREAST EXCISIONAL BIOPSY Right 09/03/2007   surgical bx removed fibroadeoma   BREAST LUMPECTOMY Right    CHOLECYSTECTOMY  02/2022   COLONOSCOPY WITH PROPOFOL  N/A 09/10/2015    Procedure: COLONOSCOPY WITH PROPOFOL ;  Surgeon: Gladis RAYMOND Mariner, MD;  Location: [facility] ENDOSCOPY;  Service: Endoscopy;  Laterality: N/A;   DIAGNOSTIC LAPAROSCOPY     ESOPHAGOGASTRODUODENOSCOPY N/A 11/28/2014   Procedure: ESOPHAGOGASTRODUODENOSCOPY (EGD);  Surgeon: Lamar ONEIDA Holmes, MD;  Location: [facility] ENDOSCOPY;  Service: Endoscopy;  Laterality: N/A;   ESOPHAGOGASTRODUODENOSCOPY N/A 02/21/2016   Procedure: ESOPHAGOGASTRODUODENOSCOPY (EGD);  Surgeon: Kavitha V Nandigam, MD;  Location: [facility] ENDOSCOPY;  Service: Endoscopy;  Laterality: N/A;   ESOPHAGOGASTRODUODENOSCOPY N/A 04/21/2016   Procedure: ESOPHAGOGASTRODUODENOSCOPY (EGD);  Surgeon: Lupita FORBES Commander, MD;  Location: [facility] ENDOSCOPY;  Service: Endoscopy;  Laterality: N/A;   ESOPHAGOGASTRODUODENOSCOPY N/A 05/06/2016   Procedure: ESOPHAGOGASTRODUODENOSCOPY (EGD);  Surgeon: Ruel Kung, MD;  Location: [facility] ENDOSCOPY;  Service: Gastroenterology;  Laterality: N/A;   ESOPHAGOGASTRODUODENOSCOPY N/A 06/13/2016   Procedure: ESOPHAGOGASTRODUODENOSCOPY (EGD);  Surgeon: Rogelia Copping, MD;  Location: [facility] ENDOSCOPY;  Service: Endoscopy;  Laterality: N/A;   ESOPHAGOGASTRODUODENOSCOPY N/A 01/14/2022   Procedure: ESOPHAGOGASTRODUODENOSCOPY (EGD);  Surgeon: Toledo, Ladell POUR, MD;  Location: [facility] ENDOSCOPY;  Service: Gastroenterology;  Laterality: N/A;   ESOPHAGOGASTRODUODENOSCOPY N/A 09/12/2023   Procedure: EGD (ESOPHAGOGASTRODUODENOSCOPY);  Surgeon: Toledo, Ladell POUR, MD;  Location: [facility] ENDOSCOPY;  Service: Gastroenterology;  Laterality: N/A;   ESOPHAGOGASTRODUODENOSCOPY (EGD) WITH PROPOFOL  N/A 02/29/2016   Procedure: ESOPHAGOGASTRODUODENOSCOPY (EGD) WITH PROPOFOL ;  Surgeon: Rogelia Copping, MD;  Location: [facility] ENDOSCOPY;  Service:  Endoscopy;  Laterality: N/A;   ESOPHAGOGASTRODUODENOSCOPY (EGD) WITH PROPOFOL  N/A 01/15/2018   Procedure: ESOPHAGOGASTRODUODENOSCOPY (EGD) WITH PROPOFOL ;  Surgeon: Shaaron Lamar HERO, MD;  Location: [facility] ENDO SUITE;  Service: Endoscopy;  Laterality:  N/A;  retrieval forein body   LAPAROSCOPIC LYSIS OF ADHESIONS  02/16/2022   Procedure: LAPAROSCOPIC LYSIS OF ADHESIONS;  Surgeon: Desiderio Schanz, MD;  Location: [facility] ORS;  Service: General;;   LAPAROTOMY N/A 09/12/2015   Procedure: EXPLORATORY LAPAROTOMY;  Surgeon: Charlie FORBES Fell, MD;  Location: [facility] ORS;  Service: General;  Laterality: N/A;   SIGMOIDOSCOPY N/A 09/12/2015   Procedure: [place];  Surgeon: Charlie FORBES Fell, MD;  Location: [facility] ORS;  Service: General;  Laterality: N/A;   WISDOM TOOTH EXTRACTION     Patient Active Problem List   Diagnosis Date Noted   Nontraumatic tear of plantar fascia 02/28/2024   Acute midline low back pain without sciatica 01/04/2024   Concussion with no loss of consciousness 11/03/2023   Mild intermittent asthma with exacerbation 06/17/2023   Viral URI with cough 06/17/2023   Amenorrhea 04/18/2023   Oropharyngeal dysphagia 04/18/2023   Drug overdose, intentional self-harm, initial encounter (HCC) 01/24/2023   Insomnia 01/22/2023   Drug overdose, multiple drugs, intentional self-harm, initial encounter (HCC) 01/21/2023   Mild intermittent asthma 01/21/2023   Seizure (HCC) 01/16/2023   Major depressive disorder, recurrent episode, severe, without psychosis (HCC) 11/11/2022   Intentional overdose (HCC) 11/08/2022   Morbid obesity (HCC) 09/30/2021   Acquired hypothyroidism 06/25/2021   Migraine without aura and without status migrainosus, not intractable 03/05/2021   Mild intermittent asthma without complication 12/01/2020   Gastroesophageal reflux disease 12/01/2020   Hypoglycemia 12/01/2020   Rheumatoid factor positive 12/09/2019   SS-A antibody positive 10/28/2019   Chronic pain of both knees 10/28/2019   Hyperprolactinemia 08/02/2019   Schizoaffective disorder, depressive type (HCC) 02/02/2018   Suicide attempt (HCC) 01/16/2018   Borderline personality disorder (HCC) 01/04/2018   PTSD (post-traumatic stress disorder) 01/03/2018   ASCUS  of cervix with negative high risk HPV 08/28/2017   Malingering 05/06/2016    PCP: Dr. Justus, MD  REFERRING PROVIDER: Dr. 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  No specific MOI.    Has seen podiatry; is wearing a walking boot.  Had an injection.  Referred to PT.  Having a lot of help from her husband because of her foot and her foot right.  Using a SPC- hoping to not use that long term  Agg: WB on R LE, prolonged standing and walk Allev: wearing a walking boot, steroid injection (minimal relief)  PERTINENT HISTORY: Early Aug- Aug 10th slipped and fell down her steps- strained her back  Also feels like her R low back is a little sore from using the boot for walking on  RLE  PAIN:  Are you having pain? Yes; R foot- 5/10; R low back 7/10   PRECAUTIONS: None  RED FLAGS: None   WEIGHT BEARING RESTRICTIONS: WBAT in boot presently  FALLS:  Has patient fallen in last 6 months? 1 fall recently down the steps in front of her house  LIVING ENVIRONMENT: Lives with: lives with their family and lives with their spouse Lives in: House/apartment  Stairs: yes- 4 to enter home 1 tall at the top in front with railing; and 3 to enter back with railing  Has following equipment at home: Single point cane  OCCUPATION: not presently working; enjoys taking walks for a hobby, enjoys sitting outside reading; doesn't drive (has seizures)  PLOF: Independent  PATIENT GOALS: Get out of the boot, stop using the cane, be able to put weight on her foot again without being limited by pain   NEXT MD VISIT: End of September  OBJECTIVE:  Note: Objective measures  were completed at Evaluation unless otherwise noted.   PATIENT SURVEYS:   LEFS: 26/80   LOWER EXTREMITY ROM:  Active ROM Right eval Left eval  Hip flexion    Hip extension    Hip abduction    Hip adduction    Hip internal rotation    Hip external rotation    Knee flexion    Knee extension    Ankle dorsiflexion -10*   Ankle plantarflexion 45*   Ankle inversion 20*   Ankle eversion 20    (Blank rows = not tested)  LOWER EXTREMITY MMT:  MMT Right eval Left eval  Hip flexion    Hip extension    Hip abduction 3+/5   Hip adduction    Hip internal rotation    Hip external rotation    Knee flexion    Knee extension    Ankle dorsiflexion 4/5   Ankle plantarflexion 3-/5*   Ankle inversion    Ankle eversion     (Blank rows = not tested)  LOWER EXTREMITY SPECIAL TESTS:  TTP along R plantar fascia and MT heads  FUNCTIONAL TESTS: Pt requires UE support for sit to stand  GAIT: pt amb with SPC in R hand upon arrival; instructed pt on sequencing/mechanics using SPC in L hand to assist R LE                                                                                                                               TREATMENT DATE: 03/06/24 Subjective: Pt reports she has been wearing the boot.  Overall feeling a little better than last week.  Plantar fascia is still sore.  She is using a shower chair still when showering since the plantar fascia re-injury.  Using SPC instead of walker this week.  Has a follow up 03/20/24 with podiatry.     Objective: Pain: 6/10 R heel, 0/10 L knee (+) edema noted at proximal plantar fascia and TTP along this area Pt using ace wrap for compression inside her boot and using the FWW Amb with SPC in L UE today MMT R PF 2/5, DF 4/5, abd 3+/5  Per chart review: MRI 02/28/24 IMPRESSION: 1. Thickening of the proximal central cord of the plantar fascia with surrounding soft tissue edema and mild attenuation near the inferior calcaneal insertion,  at the level of the inferior calcaneal spur. These findings are most compatible with  acute plantar fasciitis with suspected partial-thickness tear at the calcaneal insertion. 2. No acute osseous abnormality.  Manual Therapy:  not today STM R plantar fascia Ankle DF MWM x 15- not today Manual toe extension stretch, PF stretch: 30 second intervals x 4   Therapeutic Exercise: Gentle AROM ankle DF, PF, inv, ev x10  Seated heel slide (towel under foot) x 15 Seated inv/ev (towel under foot) x15 ea Seated toe raises x15 Seated heel raises x15 S/L hip abd 2x10 R and L SLR x10 R and x10 L, 2 sets each  Therapeutic Activities: Amb with SPC and boot on R foot in clinic with upright posture Goal reassessment/PN/re-cert  Cold pack x 10 min at end with leg elevated (foot above heart)- at home today  Not today Nustep seat #12 x 10 min Standing gastroc stretch: 30 seconds x 4 L LE SLR in supine x10 ea LE (with assist from PT for L )- not today Ankle AROM: PF/DF x 10, Inv/ev x 10 R LE Standing hip abd x10, 2 sets, ea LE not today Standing hip ext x10, 2 sets, ea LE not today Marches in standing 2x15 3# Seated LAQ L LE x 15, 3 sets 3# Self PF mob with tennis ball x 1 min reviewed and discussed how to do in standing at home- not today Towel arch scrunches: 8 reps, then 8 heel slides- repeated 3 sets- reviewed for HEP  Therapeutic Activities: Not today SLS x 5- with intermittent UE support- 20-30 second intervals R LE and L LE Standing heel raises 2x12 with UE support Sit to stand x5, 2 sets Tandem walk on blue line x 3 laps in parallel bars Lateral band monster walk 3 laps x 15 ft one way (band above ankles) Standing knee flexion hamstring curl x15, 3 sets 3#  LEFS: 31/80   PATIENT EDUCATION: Pt education for acute injury management at home with elevation, compression for edema and gentle AROM x10 ea direction x2/day, practiced wrapping with ace wrap today reviewed appropriate  technique and how to make sure it's not too tight Education details: PT POC/goals Person educated: Patient Education method: Explanation Education comprehension: verbalized understanding  HOME EXERCISE PROGRAM: Access Code: [email or web address] URL: [email or web address] Date: 02/26/2024 Prepared by: Vernell Reges  Exercises - Seated Plantar Fascia Mobilization with Small Ball  - 2 x daily - 7 x weekly - 3 minutes hold - Seated Plantar Fascia Stretch  - 2 x daily - 7 x weekly - 3 sets - 30 seconds hold - Seated Heel Raise  - 2 x daily - 7 x weekly - 2 sets - 10 reps - Seated Toe Raise  - 2 x daily - 7 x weekly - 2 sets - 10 reps - Standing Hip Abduction with Counter Support  - 2 x daily - 7 x weekly - 2 sets - 10 reps - Towel Scrunches  - 1 x daily - 7 x weekly - 3 sets - 8-10 reps - Standing Heel Raises  - 1 x daily - 7 x weekly - 3 sets - 10 reps - Gastroc Stretch on Wall  - 1 x daily - 7 x weekly - 3 sets - 20 hold - Supine Active Straight Leg Raise  - 1 x daily - 7 x weekly - 3 sets - 5 reps - Seated Heel Slide  - 1 x daily - 7 x weekly - 3 sets - 10 reps - Seated Long Arc Quad  - 1 x daily - 7 x  weekly - 3 sets - 10 reps - Side Stepping with Resistance at Ankles  - 1 x daily - 7 x weekly - 3 sets  ASSESSMENT:  CLINICAL IMPRESSION: Patient was making good progress with this course of PT.  She has had a setback in the past 2 weeks after sustaining reinjury of her R plantar fascia.  MRI per chart review shows partial tear of plantar fascia.  She is back in a walking boot WBAT and course of PT has transitioned to address current impairments.  Recommend continued PT with modified tx to focus on edema/pain control and gentle ankle ROM/proximal LE strength.  Will continue to adjust/modify tx accordingly based on tissue healing times.  Overall, she should continue to benefit from skilled PT to address impairments and facilitate improved QOL.  OBJECTIVE IMPAIRMENTS: Abnormal gait,  decreased activity tolerance, decreased balance, difficulty walking, decreased ROM, decreased strength, and pain.   ACTIVITY LIMITATIONS: carrying, standing, squatting, stairs, and locomotion level  PARTICIPATION LIMITATIONS: meal prep, cleaning, laundry, shopping, community activity, yard work, and church  PERSONAL FACTORS: Past/current experiences, Time since onset of injury/illness/exacerbation, and 1-2 comorbidities: LBP, see above PMH are also affecting patient's functional outcome.   REHAB POTENTIAL: Good  CLINICAL DECISION MAKING: Evolving/moderate complexity  EVALUATION COMPLEXITY: Moderate   GOALS: Goals reviewed with patient? Yes  SHORT TERM GOALS: Target date: 01/20/24 Pt will be able to perform HEP for R ankle/foot ROM/strengthening with proper technique and PT verbal/tactile cues needed <25% of the time Baseline: initiated at visit #2 Goal status: In progress   LONG TERM GOALS: Target date: 04/05/24  Imprive LEFS >10 points indicating pt able to perform her daily activities at home without being as significantly limited by R LE sx. Baseline: 26/80; 9/15: 31/80; 10/22: in progress Goal status: In progress  2.  Improve R ankle PF strength to >3+/5 to facilitate improved ability to amb without SPC and normal gait mechanics with R posterior chain activation with terminal stance Baseline: limited by pain 3-/5 MMT Goal status: In progress  3.  Pt will tolerate standing/walking x 15-20 min intervals for house chores, self care without wearing boot and <3/10 R foot pain Baseline: wearing boot during WB activities; 9/15: no longer in boot, standing 10-15 min intervals 3/10 pain; 10/22: back in boot due to reinjury earlier this month Goal status: In progress   PLAN:  PT FREQUENCY: 2x/week  PT DURATION: 4 weeks  PLANNED INTERVENTIONS: 97110-Therapeutic exercises, 97530- Therapeutic activity, 97112- Neuromuscular re-education, 97535- Self Care, and [id number]- Manual  therapy  PLAN FOR NEXT SESSION: ankle/foot ROM/strengthening, manual therapy, continue progressing LE strengthening; recommend continued PT especially as she weans back out of the walking boot again   Vernell Reges, PT, DPT, OCS   Vernell FORBES Reges, PT 03/06/2024, 4:58 PM

## 2024-03-11 ENCOUNTER — Ambulatory Visit

## 2024-03-13 ENCOUNTER — Encounter

## 2024-03-18 ENCOUNTER — Ambulatory Visit

## 2024-03-20 ENCOUNTER — Encounter

## 2024-03-20 ENCOUNTER — Ambulatory Visit: Admitting: Podiatry

## 2024-03-20 VITALS — Ht 67.0 in | Wt 284.0 lb

## 2024-03-20 DIAGNOSIS — M629 Disorder of muscle, unspecified: Secondary | ICD-10-CM | POA: Diagnosis not present

## 2024-03-20 DIAGNOSIS — M722 Plantar fascial fibromatosis: Secondary | ICD-10-CM | POA: Diagnosis not present

## 2024-03-21 NOTE — Progress Notes (Signed)
  Subjective:  Patient ID: Melanie Bell, female    DOB: July 01, 1990,  MRN: [id number]  Chief Complaint  Patient presents with   Plantar Fasciitis    Rm 7 Patient is here to f/u on pf of the right foot. Pt states recent injury to right foot/walking up stairs felt a popping sensation in the right heel (02/28/20). MRI results available. Pt currently using an ace wrap and walking boot for support.    33 y.o. female presents with the above complaint. History confirmed with patient.  She returns for follow-up, since my last visit with her she had felt a pop and had severe pain and had to go to the emergency room for this.  MRI was completed which showed a partial tear and placed her in a cam boot. Objective:  Physical Exam: warm, good capillary refill, no trophic changes or ulcerative lesions, normal DP and PT pulses, normal sensory exam, and today only mild pain along the plantar fascia and insertion of the calcaneus.   Radiographs: Multiple views x-ray of the right foot: Previous radiographs show a small spur  Right heel MRI 02/28/2020 shows partial tear of central band of plantar fascia  Assessment:   1. Nontraumatic tear of plantar fascia   2. Plantar fascial fibromatosis      Plan:  Patient was evaluated and treated and all questions answered. Unfortunate has had a setback with the tearing of the plantar fascia I reviewed her MRI results with her and the tearing is partial and incomplete.  I discussed with her that this may be able to heal on its own but with partial tearing as opposed to complete tearing often heals uneventfully.  Discussed with her that endoscopic plantar fasciotomy may be necessary to complete the tear and to alleviate her pain and inflammation.  Discussed the risk benefits and potential complications of surgery.  She understands and wishes to proceed.  I will schedule surgery for 4 weeks from now in early December and in the event that her pain resolves fully and  she is able to return to regular shoe gear and activity fully we will plan for surgery proceeding.  Follow-up with me after surgery or sooner if issues.   Surgical plan:  Procedure: - Right EPF  Location: - [facility]  Anesthesia plan: - Sedation with regional  Postoperative pain plan: - Tylenol  1000 mg every 6 hours, ibuprofen  600 mg every 6 hours, gabapentin  300 mg every 8 hours x5 days, oxycodone  5 mg 1-2 tabs every 6 hours only as needed  DVT prophylaxis: - ASA 81 mg twice daily  WB Restrictions / DME needs: - Weightbearing as tolerated in CAM boot postop   No follow-ups on file.

## 2024-03-27 ENCOUNTER — Ambulatory Visit

## 2024-03-28 ENCOUNTER — Telehealth: Payer: Self-pay | Admitting: Podiatry

## 2024-03-28 NOTE — Telephone Encounter (Signed)
 Called and scheduled patient for surgery on 05/17/2024. Patient not on any GLP1 medications or blood thinners. Patient pharmacy is correct in chart.

## 2024-04-08 ENCOUNTER — Ambulatory Visit: Admitting: Podiatry

## 2024-04-12 ENCOUNTER — Emergency Department: Admission: EM | Admit: 2024-04-12 | Discharge: 2024-04-12 | Disposition: A | Discharge status: Home / Self Care

## 2024-04-12 ENCOUNTER — Other Ambulatory Visit: Payer: Self-pay

## 2024-04-12 DIAGNOSIS — I10 Essential (primary) hypertension: Secondary | ICD-10-CM | POA: Diagnosis not present

## 2024-04-12 DIAGNOSIS — A059 Bacterial foodborne intoxication, unspecified: Secondary | ICD-10-CM | POA: Diagnosis not present

## 2024-04-12 DIAGNOSIS — R112 Nausea with vomiting, unspecified: Secondary | ICD-10-CM | POA: Diagnosis present

## 2024-04-12 LAB — COMPREHENSIVE METABOLIC PANEL WITH GFR
ALT: 11 U/L (ref 0–44)
AST: 19 U/L (ref 15–41)
Albumin: 4.3 g/dL (ref 3.5–5.0)
Alkaline Phosphatase: 66 U/L (ref 38–126)
Anion gap: 10 (ref 5–15)
BUN: 13 mg/dL (ref 6–20)
CO2: 23 mmol/L (ref 22–32)
Calcium: 9.7 mg/dL (ref 8.9–10.3)
Chloride: 108 mmol/L (ref 98–111)
Creatinine, Ser: 0.76 mg/dL (ref 0.44–1.00)
GFR, Estimated: >60 mL/min (ref >60)
Glucose, Bld: 87 mg/dL (ref 70–99)
Potassium: 4.5 mmol/L (ref 3.5–5.1)
Sodium: 141 mmol/L (ref 135–145)
Total Bilirubin: 0.5 mg/dL (ref 0.0–1.2)
Total Protein: 7.3 g/dL (ref 6.5–8.1)

## 2024-04-12 LAB — CBC
HCT: 41.8 % (ref 36.0–46.0)
HCT: 44.3 % (ref 36.0–46.0)
Hemoglobin: 13 g/dL (ref 12.0–15.0)
Hemoglobin: 14.2 g/dL (ref 12.0–15.0)
MCH: 22.7 pg — ABNORMAL LOW (ref 26.0–34.0)
MCH: 30.5 pg (ref 26.0–34.0)
MCHC: 31.1 g/dL (ref 30.0–36.0)
MCHC: 32.1 g/dL (ref 30.0–36.0)
MCV: 73.1 fL — ABNORMAL LOW (ref 80.0–100.0)
MCV: 95.1 fL (ref 80.0–100.0)
Platelets: 227 K/uL (ref 150–400)
Platelets: 227 K/uL (ref 150–400)
RBC: 5.72 MIL/uL — ABNORMAL HIGH (ref 3.87–5.11)
RBC: 4.66 MIL/uL (ref 3.87–5.11)
RDW: 15.2 % (ref 11.5–15.5)
RDW: 12.5 % (ref 11.5–15.5)
WBC: 4.1 K/uL (ref 4.0–10.5)
WBC: 7.9 K/uL (ref 4.0–10.5)
nRBC: 0 % (ref 0.0–0.2)
nRBC: 0 % (ref 0.0–0.2)

## 2024-04-12 LAB — URINALYSIS, ROUTINE W REFLEX MICROSCOPIC
Bilirubin Urine: NEGATIVE
Glucose, UA: NEGATIVE mg/dL
Hgb urine dipstick: NEGATIVE
Ketones, ur: NEGATIVE mg/dL
Leukocytes,Ua: NEGATIVE
Nitrite: NEGATIVE
Protein, ur: NEGATIVE mg/dL
Specific Gravity, Urine: 1.018 (ref 1.005–1.030)
pH: 7 (ref 5.0–8.0)

## 2024-04-12 LAB — POC URINE PREG, ED: Preg Test, Ur: NEGATIVE

## 2024-04-12 LAB — LIPASE, BLOOD: Lipase: 29 U/L (ref 11–51)

## 2024-04-12 MED ORDER — SODIUM CHLORIDE 0.9 % IV BOLUS
1000.0000 mL | Freq: Once | INTRAVENOUS | Status: AC
  Administered 2024-04-12: 1000 mL via INTRAVENOUS

## 2024-04-12 MED ORDER — ONDANSETRON 4 MG PO TBDP: 4.0000 mg | ORAL_TABLET | Freq: Three times a day (TID) | ORAL | 0 refills | Status: AC | PRN

## 2024-04-12 MED ORDER — PANTOPRAZOLE SODIUM 40 MG IV SOLR
40.0000 mg | Freq: Once | INTRAVENOUS | Status: AC
  Administered 2024-04-12: 40 mg via INTRAVENOUS
  Filled 2024-04-12: qty 10

## 2024-04-12 MED ORDER — DROPERIDOL 2.5 MG/ML IJ SOLN: 2.5000 mg | Freq: Once | INTRAMUSCULAR | Status: DC

## 2024-04-12 MED ORDER — SUCRALFATE 1 G PO TABS: 1.0000 g | ORAL_TABLET | Freq: Three times a day (TID) | ORAL | 0 refills | Status: AC | PRN

## 2024-04-12 MED ORDER — LORAZEPAM 2 MG/ML IJ SOLN
0.5000 mg | Freq: Once | INTRAMUSCULAR | Status: AC
  Administered 2024-04-12: 0.5 mg via INTRAVENOUS
  Filled 2024-04-12: qty 1

## 2024-04-12 MED ORDER — PROMETHAZINE HCL 25 MG RE SUPP: 25.0000 mg | Freq: Four times a day (QID) | RECTAL | 0 refills | Status: AC | PRN

## 2024-04-12 NOTE — ED Provider Notes (Signed)
 [facility] Provider Note    Event Date/Time   First MD Initiated Contact with Patient 04/12/24 1236     (approximate)   History   Abdominal Pain   HPI  Melanie Bell is a 33 y.o. female with rheumatoid arthritis on Plaquenil, PTSD, hyperlipidemia hypertension who presents with her husband for 6 hours of nausea and vomiting.  The patient states that the neighbors brought over leftover food for herself and her husband which they consumed this morning and both developed nausea and vomiting and generalized abdominal pain.  There has been no diarrhea.  There has been no fevers or chills.  The patient's husband is also a patient in our emergency department.  Both were fine prior to consuming this.      Physical Exam   Triage Vital Signs: ED Triage Vitals  Encounter Vitals Group     BP 04/12/24 1204 121/64     Girls Systolic BP Percentile --      Girls Diastolic BP Percentile --      Boys Systolic BP Percentile --      Boys Diastolic BP Percentile --      Pulse Rate 04/12/24 1204 71     Resp 04/12/24 1204 18     Temp 04/12/24 1204 98.7 F (37.1 C)     Temp src --      SpO2 04/12/24 1204 100 %     Weight --      Height --      Head Circumference --      Peak Flow --      Pain Score 04/12/24 1149 5     Pain Loc --      Pain Education --      Exclude from Growth Chart --     Most recent vital signs: Vitals:   04/12/24 1204  BP: 121/64  Pulse: 71  Resp: 18  Temp: 98.7 F (37.1 C)  SpO2: 100%    Nursing Triage Note reviewed. Vital signs reviewed and patients oxygen saturation is normoxic  General: Patient is well nourished, well developed, awake and alert, resting comfortably in no acute distress Head: Normocephalic and atraumatic Eyes: Normal inspection, extraocular muscles intact, no conjunctival pallor Ear, nose, throat: Normal external exam Neck: Normal range of motion Respiratory: Patient is in no respiratory distress, lungs  CTAB Cardiovascular: Patient is not tachycardic, RRR without murmur appreciated GI: Abd SNT with no guarding or rebound  Back: Normal inspection of the back with good strength and range of motion throughout all ext Extremities: pulses intact with good cap refills, no LE pitting edema or calf tenderness Neuro: The patient is alert and oriented to person, place, and time, appropriately conversive, with 5/5 bilat UE/LE strength, no gross motor or sensory defects noted. Coordination appears to be adequate. Skin: Warm, dry, and intact Psych: normal mood and affect, no SI or HI  ED Results / Procedures / Treatments   Labs (all labs ordered are listed, but only abnormal results are displayed) Labs Reviewed  CBC - Abnormal; Notable for the following components:      Result Value   RBC 5.72 (*)    MCV 73.1 (*)    MCH 22.7 (*)    All other components within normal limits  URINALYSIS, ROUTINE W REFLEX MICROSCOPIC - Abnormal; Notable for the following components:   Color, Urine YELLOW (*)    APPearance HAZY (*)    All other components within normal limits  LIPASE, BLOOD  COMPREHENSIVE METABOLIC PANEL WITH GFR  CBC  POC URINE PREG, ED     EKG None  RADIOLOGY None    PROCEDURES:  Critical Care performed: No  Procedures   MEDICATIONS ORDERED IN ED: Medications  droperidol  (INAPSINE ) 2.5 MG/ML injection 2.5 mg (has no administration in time range)  sodium chloride  0.9 % bolus 1,000 mL (0 mLs Intravenous Stopped 04/12/24 1525)  pantoprazole  (PROTONIX ) injection 40 mg (40 mg Intravenous Given 04/12/24 1417)  LORazepam  (ATIVAN ) injection 0.5 mg (0.5 mg Intravenous Given 04/12/24 1417)     IMPRESSION / MDM / ASSESSMENT AND PLAN / ED COURSE                                Differential diagnosis includes, but is not limited to food poisoning, electrolyte derangement anemia, pregnancy  ED course: Patient presents symptomatic with nausea and vomiting.  I did consider obtaining a  CT abdomen and pelvis however patient does not have a leukocytosis and abdominal exam is completely benign.  She had no elevation of her liver function tests or lipase.  Pregnancy test was negative and she had no ketones in her urine.  Patient felt much improved after dose of IV fluids, IV Ativan  and Protonix .  She is able to take p.o.  Repeat abdominal exam remained benign and she felt comfortable returning home.  I did send her with scripts for Zofran  ODT's and also Phenergan  suppositories and Carafate .  She will return if any acutely worsening symptoms.  She will discard the remainder of the leftover food   Clinical Course as of 04/12/24 1847  Fri Apr 12, 2024  1236 WBC: 4.1 No leukocytosis [HD]  1439 Comprehensive metabolic panel No profound abnormalities [HD]  1505 Patient reexamined abdominal exam benign.  She has been able to tolerate fluids.  She feels comfortable returning home.  I reviewed return precautions.  I will send her with scripts for Zofran , Phenergan  suppositories and Carafate .  She will follow-up with primary care physician.  All questions answered and patient voiced understanding and requested discharge [HD]    Clinical Course User Index [HD] Nicholaus Rolland BRAVO, MD   At time of discharge there is no evidence of acute life, limb, vision, or fertility threat. Patient has stable vital signs, pain is well controlled, patient is ambulatory and p.o. tolerant.  Discharge instructions were completed using the EPIC system. I would refer you to those at this time. All warnings prescriptions follow-up etc. were discussed in detail with the patient. Patient indicates understanding and is agreeable with this plan. All questions answered.  Patient is made aware that they may return to the emergency department for any worsening or new condition or for any other emergency.   -- Risk: 5 This patient has a high risk of morbidity due to further diagnostic testing or treatment. Rationale: This  patient's evaluation and management involve a high risk of morbidity due to the potential severity of presenting symptoms, need for diagnostic testing, and/or initiation of treatment that may require close monitoring. The differential includes conditions with potential for significant deterioration or requiring escalation of care. Treatment decisions in the ED, including medication administration, procedural interventions, or disposition planning, reflect this level of risk. COPA: 5 The patient has the following acute or chronic illness/injury that poses a possible threat to life or bodily function: [X] : The patient has a potentially serious acute condition or an acute exacerbation of a chronic illness  requiring urgent evaluation and management in the Emergency Department. The clinical presentation necessitates immediate consideration of life-threatening or function-threatening diagnoses, even if they are ultimately ruled out.   FINAL CLINICAL IMPRESSION(S) / ED DIAGNOSES   Final diagnoses:  Nausea and vomiting, unspecified vomiting type  Food poisoning     Rx / DC Orders   ED Discharge Orders          Ordered    ondansetron  (ZOFRAN -ODT) 4 MG disintegrating tablet  Every 8 hours PRN        04/12/24 1507    sucralfate  (CARAFATE ) 1 g tablet  3 times daily PRN        04/12/24 1507    promethazine  (PHENERGAN ) 25 MG suppository  Every 6 hours PRN        04/12/24 1507             Note:  This document was prepared using Dragon voice recognition software and may include unintentional dictation errors.   Nicholaus Rolland BRAVO, MD 04/12/24 [phone]

## 2024-04-12 NOTE — ED Triage Notes (Signed)
 Pt comes via EMS from home with c/o belly pain. Pt states this started at 0930 today. Pt states nausea and vomiting. Pt denies any diarrhea. Pt states headache and might be food poisoning since it started soon after eating.   98.7 CBG 84

## 2024-04-12 NOTE — Discharge Instructions (Addendum)

## 2024-05-10 ENCOUNTER — Other Ambulatory Visit: Payer: Self-pay | Admitting: Internal Medicine

## 2024-05-10 DIAGNOSIS — E039 Hypothyroidism, unspecified: Secondary | ICD-10-CM

## 2024-05-17 ENCOUNTER — Other Ambulatory Visit: Payer: Self-pay | Admitting: Podiatry

## 2024-05-17 DIAGNOSIS — M722 Plantar fascial fibromatosis: Secondary | ICD-10-CM | POA: Diagnosis not present

## 2024-05-17 MED ORDER — ACETAMINOPHEN 500 MG PO TABS: 1000.0000 mg | ORAL_TABLET | Freq: Four times a day (QID) | ORAL | 0 refills | Status: AC | PRN

## 2024-05-17 MED ORDER — ACETAMINOPHEN 500 MG PO TABS: 1000.0000 mg | ORAL_TABLET | Freq: Four times a day (QID) | ORAL | 0 refills | Status: DC | PRN

## 2024-05-17 MED ORDER — ASPIRIN 81 MG PO TBEC: 81.0000 mg | DELAYED_RELEASE_TABLET | Freq: Two times a day (BID) | ORAL | 0 refills | Status: AC

## 2024-05-17 MED ORDER — IBUPROFEN 600 MG PO TABS: 600.0000 mg | ORAL_TABLET | Freq: Four times a day (QID) | ORAL | 0 refills | Status: DC | PRN

## 2024-05-17 MED ORDER — IBUPROFEN 600 MG PO TABS: 600.0000 mg | ORAL_TABLET | Freq: Four times a day (QID) | ORAL | 0 refills | Status: AC | PRN

## 2024-05-17 MED ORDER — OXYCODONE HCL 5 MG PO TABS: 5.0000 mg | ORAL_TABLET | ORAL | 0 refills | Status: DC | PRN

## 2024-05-17 MED ORDER — OXYCODONE HCL 5 MG PO TABS: 5.0000 mg | ORAL_TABLET | ORAL | 0 refills | Status: AC | PRN

## 2024-05-17 MED ORDER — ASPIRIN 81 MG PO TBEC: 81.0000 mg | DELAYED_RELEASE_TABLET | Freq: Two times a day (BID) | ORAL | 0 refills | Status: DC

## 2024-05-20 DIAGNOSIS — E039 Hypothyroidism, unspecified: Secondary | ICD-10-CM

## 2024-05-22 ENCOUNTER — Ambulatory Visit: Admitting: Podiatry

## 2024-05-22 VITALS — Ht 67.0 in | Wt 284.0 lb

## 2024-05-22 DIAGNOSIS — M722 Plantar fascial fibromatosis: Secondary | ICD-10-CM

## 2024-05-22 NOTE — Progress Notes (Signed)
"  °  Subjective:  Patient ID: Melanie Bell, female    DOB: 1990/08/03,  MRN: [id number]  Chief Complaint  Patient presents with   Post-op Follow-up    RM 1 POV #1 DOS 1/2/2026RT ENDOSCOPIC PLANTAR FASCIOTOMY)30 MINS. Surgical site is healing well, all sutures are intact with minimum swelling. Pt states great improvement in pain.     34 y.o. female returns for post-op check.  She is doing well pain is controlled  Review of Systems: Negative except as noted in the HPI. Denies N/V/F/Ch.   Objective:  There were no vitals filed for this visit. Body mass index is 44.48 kg/m. Constitutional Well developed. Well nourished.  Vascular Foot warm and well perfused. Capillary refill normal to all digits.  Calf is soft and supple, no posterior calf or knee pain, negative Homans' sign  Neurologic Normal speech. Oriented to person, place, and time. Epicritic sensation to light touch grossly present bilaterally.  Dermatologic Skin healing well without signs of infection. Skin edges well coapted without signs of infection.  Orthopedic: Tenderness to palpation noted about the surgical site.   Assessment:   1. Plantar fasciitis of right foot    Plan:  Patient was evaluated and treated and all questions answered.  S/p foot surgery left -Progressing as expected post-operatively.  Okay to shower does not eat large manage can apply Band-Aid after showering to incision site, may utilize a regular sock okay to remove boot for range of motion of toes and ankle.  Would continue to sleep in boot.  Return in 2 weeks for suture.  Weightbearing as tolerated in cam boot   No follow-ups on file.  "

## 2024-05-27 ENCOUNTER — Telehealth: Payer: Self-pay | Admitting: Podiatry

## 2024-05-27 ENCOUNTER — Other Ambulatory Visit: Payer: Self-pay

## 2024-05-27 ENCOUNTER — Emergency Department
Admission: EM | Admit: 2024-05-27 | Discharge: 2024-05-27 | Disposition: A | Discharge status: Home / Self Care | Attending: Emergency Medicine | Admitting: Emergency Medicine

## 2024-05-27 DIAGNOSIS — E039 Hypothyroidism, unspecified: Secondary | ICD-10-CM | POA: Diagnosis not present

## 2024-05-27 DIAGNOSIS — B9689 Other specified bacterial agents as the cause of diseases classified elsewhere: Secondary | ICD-10-CM | POA: Insufficient documentation

## 2024-05-27 DIAGNOSIS — L089 Local infection of the skin and subcutaneous tissue, unspecified: Secondary | ICD-10-CM | POA: Insufficient documentation

## 2024-05-27 DIAGNOSIS — J45909 Unspecified asthma, uncomplicated: Secondary | ICD-10-CM | POA: Insufficient documentation

## 2024-05-27 LAB — BASIC METABOLIC PANEL WITH GFR
Anion gap: 7 (ref 5–15)
BUN: 13 mg/dL (ref 6–20)
CO2: 25 mmol/L (ref 22–32)
Calcium: 9 mg/dL (ref 8.9–10.3)
Chloride: 107 mmol/L (ref 98–111)
Creatinine, Ser: 0.67 mg/dL (ref 0.44–1.00)
GFR, Estimated: >60 mL/min
Glucose, Bld: 100 mg/dL — ABNORMAL HIGH (ref 70–99)
Potassium: 4.5 mmol/L (ref 3.5–5.1)
Sodium: 139 mmol/L (ref 135–145)

## 2024-05-27 LAB — CBC
HCT: 40.8 % (ref 36.0–46.0)
Hemoglobin: 13.2 g/dL (ref 12.0–15.0)
MCH: 30.5 pg (ref 26.0–34.0)
MCHC: 32.4 g/dL (ref 30.0–36.0)
MCV: 94.2 fL (ref 80.0–100.0)
Platelets: 210 K/uL (ref 150–400)
RBC: 4.33 MIL/uL (ref 3.87–5.11)
RDW: 12.7 % (ref 11.5–15.5)
WBC: 8 K/uL (ref 4.0–10.5)
nRBC: 0 % (ref 0.0–0.2)

## 2024-05-27 MED ORDER — CEPHALEXIN 500 MG PO CAPS: 500.0000 mg | ORAL_CAPSULE | Freq: Three times a day (TID) | ORAL | 0 refills | Status: AC

## 2024-05-27 NOTE — Telephone Encounter (Signed)
 Patient called this am stating she had surgery on the 2nd of January. Patient states she is running a fever of 102 and thinks her incision might be infected. Let pt know I would call her back to let her know what to do.

## 2024-05-27 NOTE — ED Triage Notes (Signed)
 See first nurse note. Pt reports +fever and possible redness to incision sites on right foot from surgery on 1/2

## 2024-05-27 NOTE — Discharge Instructions (Signed)
 Follow-up with Dr. Debera if any continued problems.  Continue to watch your foot for any further signs of infection.  Elevate your foot often to prevent swelling.  Continue with your medications at home.  Antibiotic was sent to the pharmacy for you to take for the next 7 days.

## 2024-05-27 NOTE — ED Notes (Signed)
 Unsuccessful phlebotomy attempt by NT x1

## 2024-05-27 NOTE — ED Notes (Signed)
 Unsuccessful lab stick - lab called

## 2024-05-27 NOTE — ED Triage Notes (Addendum)
 Pt from home via EMS- pt reports surgery on rt foot on 1/2 seen last week for f/u appt and everything looked ok pt now reports redness and pain to foot. Pt has postop boot on foot.  132/72 68 99RA 98.4

## 2024-05-27 NOTE — ED Provider Notes (Signed)
 "  [facility] Provider Note    Event Date/Time   First MD Initiated Contact with Patient 05/27/24 1021     (approximate)   History   Post-op Problem   HPI  Melanie Bell is a 34 y.o. female presents to the ED with complaint of possible infection to her foot after a procedure to release her plantar fasciitis.  Patient has surgery on 05/17/2024.  Patient denies any drainage from the incision site and continues to take her ibuprofen  and pain medication only as directed.  Patient has a history of mild intermittent asthma, GERD, migraine, hypothyroidism, PTSD, borderline personality, schizoaffective disorder.     Physical Exam   Triage Vital Signs: ED Triage Vitals  Encounter Vitals Group     BP 05/27/24 1005 (!) 111/57     Girls Systolic BP Percentile --      Girls Diastolic BP Percentile --      Boys Systolic BP Percentile --      Boys Diastolic BP Percentile --      Pulse Rate 05/27/24 1005 66     Resp 05/27/24 1005 19     Temp 05/27/24 1005 98.4 F (36.9 C)     Temp Source 05/27/24 1005 Oral     SpO2 05/27/24 1005 98 %     Weight 05/27/24 1007 282 lb (127.9 kg)     Height 05/27/24 1007 5' 7 (1.702 m)     Head Circumference --      Peak Flow --      Pain Score 05/27/24 1012 8     Pain Loc --      Pain Education --      Exclude from Growth Chart --     Most recent vital signs: Vitals:   05/27/24 1110 05/27/24 1209  BP:  118/64  Pulse:  64  Resp:  18  Temp:    SpO2: 99% 100%     General: Awake, no distress.  CV:  Good peripheral perfusion.  Resp:  Normal effort.  Abd:  No distention.  Other:  Right foot plantar aspect with incision sites without drainage or erythema.  Marked tenderness noted to the plantar aspect with light palpation.  Pulses are present.  Capillary fill is less than 3 seconds.  Motor or sensory function intact.  Skin is slightly warm to touch.   ED Results / Procedures / Treatments   Labs (all labs ordered  are listed, but only abnormal results are displayed) Labs Reviewed  BASIC METABOLIC PANEL WITH GFR - Abnormal; Notable for the following components:      Result Value   Glucose, Bld 100 (*)    All other components within normal limits  CBC      PROCEDURES:  Critical Care performed:   Procedures   MEDICATIONS ORDERED IN ED: Medications - No data to display   IMPRESSION / MDM / ASSESSMENT AND PLAN / ED COURSE  I reviewed the triage vital signs and the nursing notes.   Differential diagnosis includes, but is not limited to, wound dehiscence, wound infection, cellulitis  34 year old female presents to the ED with complaint of right foot pain and questionable infection after having a surgical procedure done the first of the year.  Lab work was reassuring and patient was made aware.  She plans to make an appointment with her podiatrist for a follow-up appointment.  We discussed oral antibiotics which patient prefers to do.  A prescription for Keflex  500 mg 3 times  daily for 7 days was sent to the pharmacy.  She is strongly encouraged to watch the areas for any further signs of infection or worsening of her symptoms at which time she will return to the emergency department.  She is to follow-up with her podiatrist.      Patient's presentation is most consistent with acute complicated illness / injury requiring diagnostic workup.  FINAL CLINICAL IMPRESSION(S) / ED DIAGNOSES   Final diagnoses:  Bacterial skin infection     Rx / DC Orders   ED Discharge Orders          Ordered    cephALEXin  (KEFLEX ) 500 MG capsule  3 times daily        05/27/24 1151             Note:  This document was prepared using Dragon voice recognition software and may include unintentional dictation errors.   Saunders Shona CROME, PA-C 05/27/24 1229    Arlander Charleston, MD 05/27/24 1239  "

## 2024-06-01 ENCOUNTER — Encounter: Payer: Self-pay | Admitting: Emergency Medicine

## 2024-06-01 DIAGNOSIS — R1032 Left lower quadrant pain: Secondary | ICD-10-CM | POA: Insufficient documentation

## 2024-06-01 DIAGNOSIS — E039 Hypothyroidism, unspecified: Secondary | ICD-10-CM | POA: Diagnosis not present

## 2024-06-01 DIAGNOSIS — R112 Nausea with vomiting, unspecified: Secondary | ICD-10-CM | POA: Diagnosis not present

## 2024-06-01 DIAGNOSIS — R1031 Right lower quadrant pain: Secondary | ICD-10-CM | POA: Insufficient documentation

## 2024-06-01 DIAGNOSIS — R197 Diarrhea, unspecified: Secondary | ICD-10-CM | POA: Insufficient documentation

## 2024-06-01 NOTE — ED Triage Notes (Signed)
 Pt presents to the ED via [facility] with complaints of lower abdominal pain and bloody stool that started tonight. Pt with bright red blood mixed in with her stool - 2 episodes tonight. A&Ox4 at this time. Denies CP or SOB.   VSS with EMS

## 2024-06-02 ENCOUNTER — Emergency Department
Admission: EM | Admit: 2024-06-02 | Discharge: 2024-06-02 | Disposition: A | Attending: Emergency Medicine | Admitting: Emergency Medicine

## 2024-06-02 DIAGNOSIS — R197 Diarrhea, unspecified: Secondary | ICD-10-CM

## 2024-06-02 DIAGNOSIS — R112 Nausea with vomiting, unspecified: Secondary | ICD-10-CM

## 2024-06-02 LAB — COMPREHENSIVE METABOLIC PANEL WITH GFR
ALT: 8 U/L (ref 0–44)
AST: 14 U/L — ABNORMAL LOW (ref 15–41)
Albumin: 4.2 g/dL (ref 3.5–5.0)
Alkaline Phosphatase: 76 U/L (ref 38–126)
Anion gap: 10 (ref 5–15)
BUN: 17 mg/dL (ref 6–20)
CO2: 23 mmol/L (ref 22–32)
Calcium: 9.4 mg/dL (ref 8.9–10.3)
Chloride: 106 mmol/L (ref 98–111)
Creatinine, Ser: 0.76 mg/dL (ref 0.44–1.00)
GFR, Estimated: >60 mL/min
Glucose, Bld: 102 mg/dL — ABNORMAL HIGH (ref 70–99)
Potassium: 4.1 mmol/L (ref 3.5–5.1)
Sodium: 139 mmol/L (ref 135–145)
Total Bilirubin: 0.3 mg/dL (ref 0.0–1.2)
Total Protein: 7 g/dL (ref 6.5–8.1)

## 2024-06-02 LAB — POC URINE PREG, ED: Preg Test, Ur: NEGATIVE

## 2024-06-02 LAB — URINALYSIS, ROUTINE W REFLEX MICROSCOPIC
Bilirubin Urine: NEGATIVE
Glucose, UA: NEGATIVE mg/dL
Hgb urine dipstick: NEGATIVE
Ketones, ur: NEGATIVE mg/dL
Leukocytes,Ua: NEGATIVE
Nitrite: NEGATIVE
Protein, ur: NEGATIVE mg/dL
Specific Gravity, Urine: 1.01 (ref 1.005–1.030)
pH: 7 (ref 5.0–8.0)

## 2024-06-02 LAB — TYPE AND SCREEN
ABO/RH(D): B POS
Antibody Screen: NEGATIVE

## 2024-06-02 LAB — CBC
HCT: 42.2 % (ref 36.0–46.0)
Hemoglobin: 13.9 g/dL (ref 12.0–15.0)
MCH: 30.7 pg (ref 26.0–34.0)
MCHC: 32.9 g/dL (ref 30.0–36.0)
MCV: 93.2 fL (ref 80.0–100.0)
Platelets: 207 K/uL (ref 150–400)
RBC: 4.53 MIL/uL (ref 3.87–5.11)
RDW: 12.8 % (ref 11.5–15.5)
WBC: 9.9 K/uL (ref 4.0–10.5)
nRBC: 0 % (ref 0.0–0.2)

## 2024-06-02 LAB — LIPASE, BLOOD: Lipase: 30 U/L (ref 11–51)

## 2024-06-02 MED ORDER — AZITHROMYCIN 500 MG PO TABS
500.0000 mg | ORAL_TABLET | Freq: Once | ORAL | Status: AC
  Administered 2024-06-02: 500 mg via ORAL
  Filled 2024-06-02: qty 1

## 2024-06-02 MED ORDER — AZITHROMYCIN 500 MG PO TABS: 500.0000 mg | ORAL_TABLET | Freq: Every day | ORAL | 0 refills | Status: AC

## 2024-06-02 NOTE — ED Provider Notes (Signed)
 "  [facility] Provider Note   Event Date/Time   First MD Initiated Contact with Patient 06/02/24 [phone]     (approximate) History  Abdominal Pain  HPI Melanie Bell is a 34 y.o. female schizoaffective disorder, PTSD, malingering hypothyroidism, and thyroid  disease presents complaining of bloody diarrhea with associated nausea and vomiting after 5 days of Keflex  for a right lower extremity skin soft tissue infection.  Patient states that the symptoms began yesterday with watery diarrhea that she describes as 4-5 episodes per day and mild associated bilateral lower quadrant abdominal pain that is relieved after a bowel movement.  Patient states that overnight into today she has had 2-3 bowel movements have a small amount of bright red blood.  Patient denies any known history of diverticulosis, internal hemorrhoids, or inflammatory bowel diseases.  Patient denies any recent travel, sick contacts, or food at the ordinary ROS: Patient currently denies any vision changes, tinnitus, difficulty speaking, facial droop, sore throat, chest pain, shortness of breath, dysuria, or weakness/numbness/paresthesias in any extremity   Physical Exam  Triage Vital Signs: ED Triage Vitals  Encounter Vitals Group     BP 06/01/24 2342 (!) 105/51     Girls Systolic BP Percentile --      Girls Diastolic BP Percentile --      Boys Systolic BP Percentile --      Boys Diastolic BP Percentile --      Pulse Rate 06/01/24 2342 87     Resp 06/01/24 2342 18     Temp 06/01/24 2342 98.4 F (36.9 C)     Temp src --      SpO2 06/01/24 2342 100 %     Weight --      Height --      Head Circumference --      Peak Flow --      Pain Score 06/01/24 2339 8     Pain Loc --      Pain Education --      Exclude from Growth Chart --    Most recent vital signs: Vitals:   06/01/24 2342 06/02/24 0240  BP: (!) 105/51 101/60  Pulse: 87 71  Resp: 18 16  Temp: 98.4 F (36.9 C) (!) 97.5 F (36.4 C)   SpO2: 100% 98%   General: Awake, oriented x4. CV:  Good peripheral perfusion. Resp:  Normal effort. Abd:  No distention.  Nontender to palpation Other:  Obese middle-aged Caucasian female resting comfortably in no acute distress.  Walking boot in place to right lower extremity ED Results / Procedures / Treatments  Labs (all labs ordered are listed, but only abnormal results are displayed) Labs Reviewed  COMPREHENSIVE METABOLIC PANEL WITH GFR - Abnormal; Notable for the following components:      Result Value   Glucose, Bld 102 (*)    AST 14 (*)    All other components within normal limits  URINALYSIS, ROUTINE W REFLEX MICROSCOPIC - Abnormal; Notable for the following components:   Color, Urine YELLOW (*)    APPearance CLEAR (*)    All other components within normal limits  LIPASE, BLOOD  CBC  POC URINE PREG, ED  TYPE AND SCREEN   PROCEDURES: Critical Care performed: No Procedures MEDICATIONS ORDERED IN ED: Medications  azithromycin  (ZITHROMAX ) tablet 500 mg (500 mg Oral Given 06/02/24 0234)   IMPRESSION / MDM / ASSESSMENT AND PLAN / ED COURSE  I reviewed the triage vital signs and the nursing notes.  The patient is on the cardiac monitor to evaluate for evidence of arrhythmia and/or significant heart rate changes. Patient's presentation is most consistent with acute presentation with potential threat to life or bodily function. Patient is a 34 year old female with the above-stated past medical history who presents for 1 day of of bloody diarrhea and 2 days of nausea/vomiting/diarrhea in the setting of 5 days of Keflex  for skin soft tissue infection of the right foot DDx: Upper GI bleeding, lower GI bleeding, AVM rupture, bacterial diarrhea Plan: CBC, CMP, lipase, type and screen  Laboratory evaluation shows no evidence of anemia.  Given patient's recent antibiotic use, concern for possible antibiotic related diarrhea.  Given the possibility,  patient was started on 3 days of azithromycin  with instructions to use probiotics as well.  Patient was counseled to follow-up with her primary care physician for recheck of her hemoglobin if bloody stools continue.  Patient expressed understanding and agrees with plan for discharge at this time.  Patient given strict return precautions and all questions answered prior to discharge  Dispo: Discharge home with PCP follow-up   FINAL CLINICAL IMPRESSION(S) / ED DIAGNOSES   Final diagnoses:  Bloody diarrhea  Nausea and vomiting, unspecified vomiting type   Rx / DC Orders   ED Discharge Orders          Ordered    azithromycin  (ZITHROMAX ) 500 MG tablet  Daily        06/02/24 0229           Note:  This document was prepared using Dragon voice recognition software and may include unintentional dictation errors.   Jossie Artist POUR, MD 06/03/24 [phone]  "

## 2024-06-02 NOTE — Discharge Instructions (Addendum)
 Please use a 'probiotic' any time you are using an antibiotic.  You may use over-the-counter pills of probiotics or at least 1 serving of food with active cultures, including things like yogurt, kombucha, or kefir, per day every day while on these antibiotics.

## 2024-06-05 ENCOUNTER — Ambulatory Visit (INDEPENDENT_AMBULATORY_CARE_PROVIDER_SITE_OTHER): Admitting: Podiatry

## 2024-06-05 VITALS — Ht 67.0 in | Wt 282.0 lb

## 2024-06-05 DIAGNOSIS — M722 Plantar fascial fibromatosis: Secondary | ICD-10-CM

## 2024-06-05 NOTE — Progress Notes (Signed)
"  °  Subjective:  Patient ID: Melanie Bell, female    DOB: [birth year],  MRN: [id number]  Chief Complaint  Patient presents with   Post-op Follow-up    RM 2 POV #2 DOS 1/2/2026RT ENDOSCOPIC PLANTAR FASCIOTOM     34 y.o. female returns for post-op check.  She is doing well pain is controlled  Review of Systems: Negative except as noted in the HPI. Denies N/V/F/Ch.   Objective:  There were no vitals filed for this visit. Body mass index is 44.17 kg/m. Constitutional Well developed. Well nourished.  Vascular Foot warm and well perfused. Capillary refill normal to all digits.  Calf is soft and supple, no posterior calf or knee pain, negative Homans' sign  Neurologic Normal speech. Oriented to person, place, and time. Epicritic sensation to light touch grossly present bilaterally.  Dermatologic Incisions are well and not hypertrophic  Orthopedic: Minimal pain to palpation noted about the surgical site.   Assessment:   1. Plantar fasciitis of right foot    Plan:  Patient was evaluated and treated and all questions answered.  S/p foot surgery left Sutures removed weightbearing as tolerated in cam boot and can begin to transition back to regular shoe gear.  Work on range of motion of ankle and digits.  May consider PT if not improving.   No follow-ups on file.  "

## 2024-06-06 ENCOUNTER — Other Ambulatory Visit

## 2024-06-06 ENCOUNTER — Ambulatory Visit: Admitting: Internal Medicine

## 2024-06-06 ENCOUNTER — Encounter: Payer: Self-pay | Admitting: Internal Medicine

## 2024-06-06 VITALS — BP 142/86 | HR 91 | Ht 67.0 in | Wt 282.0 lb

## 2024-06-06 DIAGNOSIS — E221 Hyperprolactinemia: Secondary | ICD-10-CM

## 2024-06-06 DIAGNOSIS — E039 Hypothyroidism, unspecified: Secondary | ICD-10-CM

## 2024-06-06 NOTE — Patient Instructions (Signed)
 You are on levothyroxine  - which is your thyroid  hormone supplement. You MUST take this consistently.  You should take this first thing in the morning on an empty stomach with water. You should not take it with other medications. Wait to 1hr prior to eating. If you are taking any vitamins - please take these in the evening.   If you miss a dose, please take your missed dose the following day (double the dose for that day). You should have a pill box for ONLY levothyroxine  on your bedside table to help you remember to take your medications.

## 2024-06-06 NOTE — Progress Notes (Unsigned)
 "  [name]  MRN/ DOB: [id number], 08/20/1990    Age/ Sex: 34 y.o., female     PCP: Justus Leita DEL, MD   Reason for Endocrinology Evaluation: Hyperprolactinemia      Initial Endocrinology Clinic Visit: 08/02/2019    PATIENT IDENTIFIER: Melanie Bell is a 34 y.o., female with a past medical history of Bipolar disorder and anxiety. She has followed with [facility] since 08/02/2019 for consultative assistance with management of her hyperprolactinemia .   HISTORICAL SUMMARY:  Pt was noted to have an elevated prolactin level at 66.3 ng/mL, during evaluation of secondary amenorrhea and galactorrhea in 06/2019.    She does endorse history of elevated prolactin since her teenage years.  She has been diagnosed with bipolar disorder, PTSD and anxiety and has been on multiple antipsychotic medications over the years.  She has been on ox carbamazepine as well as risperidone in the past.   Prolactin on initial presentation to our clinic was 40.2 ng/mL, with normal LH, FSH, Testosterone  and TSH    An MRI was obtained 09/09/2019 which demonstrated normal pituitary but abnormal white matter.  Pt was advised to discuss switching Invega with psych as it has been associated with elevated prolactin .    She was seen by Dr. Skeet ( Neurology for abnormal white matter)   Sister  And mother with hypothyroidism and  Sister with hyperthyroidism    She was started on cabergoline  04/2021 with elevated prolactin again with a max level of 62.7 ng/mL   Invega switched to Abilify  2023 by psychiatry   The patient was lost to follow-up for over 2 years until her return in January, 2026 but then she was off cabergoline  for a while     HYPOTHYROID HISTORY:  Patient reports being on LT-4 replacement therapy since her teenage years, she had ran out of her medication by 06/2019 and was advised not to restart since her TFTs were normal. Repeat TSH elevated in 07/2019 was  elevated at 4.77 uIU/mL. And LT-4 replacement restarted.     Got married 1/7th 2023 She does not have a guardian anymore, reinstated her rights 07/2020     SUBJECTIVE:    Today (06/06/24):  Melanie Bell is here for hyperprolactinemia and hypothyroidism.   The patient has NOT been to our clinic in 2 years  She was seen yesterday by podiatry, she underwent right endoscopic plantar fasciectomy 05/17/2024 -she is wearing a boot   She continues to follow-up with neurology for migraine headaches Continues to follow up with psychiatry, currently on Lithium  only  She is accompanied by her spouse  She is currently having URI symptoms  Rectal bleed has resolved  Abdominal pain resolved  No nausea  LMP 05/28/2024 No nipple discharge  No palpitations  No local neck swelling  No COC 's  Medications Levothyroxine  75 mcg daily  Carbergoline 0.5  weekly- not taking         HISTORY:  Past Medical History:  Past Medical History:  Diagnosis Date   Acquired hypothyroidism    Allergy    Anxiety    Arthritis    ASCUS of cervix with negative high risk HPV    Asthma    Depression    Foreign body aspiration    GERD (gastroesophageal reflux disease)    Hallucinations 09/30/2014   Sizoaffective   Hematemesis 01/14/2022   Hyperlipidemia    Hyperprolactinemia    Hypertension    Hypoglycemia    Intentional  ingestion of batteries 04/21/2016    2 AAA batteries and 1 thumb tack; intent to hurt herself/notes 04/21/2016   Malingering    Migraine without aura and without status migrainosus, not intractable    Peritoneal adhesions without obstruction 02/16/2022   Pharyngoesophageal dysphagia    PTSD (post-traumatic stress disorder)    Rheumatoid factor positive    Schizoaffective disorder, bipolar type (HCC)    Seizure (HCC) 01/16/2023   Self-inflicted injury 11/23/2017   SS-A antibody positive    Suicidal ideation 01/02/2018   Tardive dyskinesia 10/2014   recent onset   Thyroid   disease    Past Surgical History:  Past Surgical History:  Procedure Laterality Date   ABDOMINAL SURGERY     years ago to remove foreign objects   APPENDECTOMY     BREAST EXCISIONAL BIOPSY Right 09/03/2007   surgical bx removed fibroadeoma   BREAST LUMPECTOMY Right    CHOLECYSTECTOMY  02/2022   COLONOSCOPY WITH PROPOFOL  N/A 09/10/2015   Procedure: COLONOSCOPY WITH PROPOFOL ;  Surgeon: Gladis RAYMOND Mariner, MD;  Location: [facility] ENDOSCOPY;  Service: Endoscopy;  Laterality: N/A;   DIAGNOSTIC LAPAROSCOPY     ESOPHAGOGASTRODUODENOSCOPY N/A 11/28/2014   Procedure: ESOPHAGOGASTRODUODENOSCOPY (EGD);  Surgeon: Lamar ONEIDA Holmes, MD;  Location:  [facility] ENDOSCOPY;  Service: Endoscopy;  Laterality: N/A;   ESOPHAGOGASTRODUODENOSCOPY N/A 02/21/2016   Procedure: ESOPHAGOGASTRODUODENOSCOPY (EGD);  Surgeon: Kavitha V Nandigam, MD;  Location: [facility] ENDOSCOPY;  Service: Endoscopy;  Laterality: N/A;   ESOPHAGOGASTRODUODENOSCOPY N/A 04/21/2016   Procedure: ESOPHAGOGASTRODUODENOSCOPY (EGD);  Surgeon: Lupita FORBES Commander, MD;  Location: [facility] ENDOSCOPY;  Service: Endoscopy;  Laterality: N/A;   ESOPHAGOGASTRODUODENOSCOPY N/A 05/06/2016   Procedure: ESOPHAGOGASTRODUODENOSCOPY (EGD);  Surgeon: Ruel Kung, MD;  Location: [facility] ENDOSCOPY;  Service: Gastroenterology;  Laterality: N/A;   ESOPHAGOGASTRODUODENOSCOPY N/A 06/13/2016   Procedure: ESOPHAGOGASTRODUODENOSCOPY (EGD);  Surgeon: Rogelia Copping, MD;  Location: [facility] ENDOSCOPY;  Service: Endoscopy;  Laterality: N/A;   ESOPHAGOGASTRODUODENOSCOPY N/A 01/14/2022   Procedure: ESOPHAGOGASTRODUODENOSCOPY (EGD);  Surgeon: Toledo, Ladell POUR, MD;  Location: [facility] ENDOSCOPY;  Service: Gastroenterology;  Laterality: N/A;   ESOPHAGOGASTRODUODENOSCOPY N/A 09/12/2023   Procedure: EGD (ESOPHAGOGASTRODUODENOSCOPY);  Surgeon: Toledo, Ladell POUR, MD;  Location: [facility] ENDOSCOPY;  Service: Gastroenterology;  Laterality: N/A;   ESOPHAGOGASTRODUODENOSCOPY (EGD) WITH PROPOFOL  N/A 02/29/2016   Procedure:  ESOPHAGOGASTRODUODENOSCOPY (EGD) WITH PROPOFOL ;  Surgeon: Rogelia Copping, MD;  Location: [facility] ENDOSCOPY;  Service: Endoscopy;  Laterality: N/A;   ESOPHAGOGASTRODUODENOSCOPY (EGD) WITH PROPOFOL  N/A 01/15/2018   Procedure: ESOPHAGOGASTRODUODENOSCOPY (EGD) WITH PROPOFOL ;  Surgeon: Shaaron Lamar HERO, MD;  Location: [facility];  Service: Endoscopy;  Laterality: N/A;  retrieval forein body   LAPAROSCOPIC LYSIS OF ADHESIONS  02/16/2022   Procedure: LAPAROSCOPIC LYSIS OF ADHESIONS;  Surgeon: Desiderio Schanz, MD;  Location: [facility] ORS;  Service: General;;   LAPAROTOMY N/A 09/12/2015   Procedure: EXPLORATORY LAPAROTOMY;  Surgeon: Charlie FORBES Fell, MD;  Location: [facility] ORS;  Service: General;  Laterality: N/A;   SIGMOIDOSCOPY N/A 09/12/2015   Procedure: [place];  Surgeon: Charlie FORBES Fell, MD;  Location: [facility] ORS;  Service: General;  Laterality: N/A;   WISDOM TOOTH EXTRACTION     Social History:  reports that she quit smoking about 7 years ago. Her smoking use included cigarettes. She started smoking about 10 years ago. She has a 1.5 pack-year smoking history. She has been exposed to tobacco smoke. She has never used smokeless tobacco. She reports that she does not drink alcohol and does not use drugs. Family History:  Family History  Problem Relation Age of Onset   Depression  Mother    Hypertension Mother    Sleep apnea Mother    Asthma Mother    COPD Mother    Diabetes Mother    Anxiety disorder Mother    Arthritis Mother    Heart failure Mother    Breast cancer Maternal Aunt        20's   Stroke Maternal Grandmother    Heart attack Maternal Grandfather      HOME MEDICATIONS: Allergies as of 05/02/2022       Reactions   Betadine [povidone Iodine] Other (See Comments)   Reaction:  Unknown    Fish Allergy Hives   Iodine Rash   Shellfish-derived Products Other (See Comments)   Reaction:  Unknown         Medication List        Accurate as of May 02, 2022  2:30 PM. If you have  any questions, ask your nurse or doctor.          STOP taking these medications    acetaminophen  500 MG tablet Commonly known as: TYLENOL  Stopped by: Donell JINNY Butts, MD       TAKE these medications    albuterol  108 (90 Base) MCG/ACT inhaler Commonly known as: VENTOLIN  HFA 2 puffs four times a day as needed   ARIPiprazole  5 MG tablet Commonly known as: ABILIFY  Take 5 mg by mouth daily.   cabergoline  0.5 MG tablet Commonly known as: DOSTINEX  TAKE 1 TABLET (0.5 MG TOTAL) BY MOUTH 2 (TWO) TIMES A WEEK.    escitalopram  10 MG tablet Commonly known as: LEXAPRO  Take 10 mg by mouth daily.   fluticasone -salmeterol 45-21 MCG/ACT inhaler Commonly known as: ADVAIR HFA Inhale 2 puffs into the lungs 2 (two) times daily.   hydrOXYzine  50 MG capsule Commonly known as: VISTARIL  Take 50 mg by mouth 2 (two) times daily as needed.   levothyroxine  75 MCG tablet Commonly known as: SYNTHROID  Take 1 tablet (75 mcg total) by mouth as directed. Two tablets on Sundays and Wednesday and 1 tablet rest of the week   lithium  carbonate 300 MG capsule Take 1 capsule (300 mg total) by mouth 2 (two) times daily with a meal.   magnesium  oxide 400 MG tablet Commonly known as: MAG-OX Take 400 mg by mouth daily.   omeprazole  20 MG capsule Commonly known as: PRILOSEC TAKE 1 CAPSULE (20 MG TOTAL) BY MOUTH DAILY.   ondansetron  4 MG disintegrating tablet Commonly known as: ZOFRAN -ODT Take 1 tablet (4 mg total) by mouth every 8 (eight) hours as needed for nausea or vomiting.   prazosin  2 MG capsule Commonly known as: MINIPRESS  Take 1 capsule (2 mg total) by mouth at bedtime.   pregabalin  50 MG capsule Commonly known as: LYRICA  Take 50 mg by mouth 2 (two) times daily.   SUMAtriptan  20 MG/ACT nasal spray Commonly known as: IMITREX  Place 20 mg into the nose as needed.   topiramate  25 MG tablet Commonly known as: TOPAMAX  Take 25 mg by mouth 2 (two) times daily.           OBJECTIVE:   PHYSICAL EXAM: VS: BP (!) 142/86   Pulse 91   Ht 5' 7 (1.702 m)   Wt 282 lb (127.9 kg)   LMP 05/27/2024   BMI 44.17 kg/m   EXAM: General: Pt appears well and is in NAD  Neck: General: Supple without adenopathy. Thyroid : Thyroid  size normal.    Lungs: Clear with good BS bilat with no rales, rhonchi, or wheezes  Heart: Auscultation: RRR.  Abdomen: Normoactive bowel sounds, soft, nontender, without masses or organomegaly palpable  Extremities:  BL LE: No pretibial edema normal ROM and strength.  Mental Status: Judgment, insight: Intact Mood and affect: No depression, anxiety, or agitation     DATA REVIEWED:    Latest Reference Range & Units 06/06/24 10:22  Prolactin ng/mL 15.4  TSH mIU/L 1.15  T4,Free(Direct) 0.8 - 1.8 ng/dL 1.1       Latest Reference Range & Units 06/01/24 23:41  Sodium 135 - 145 mmol/L 139  Potassium 3.5 - 5.1 mmol/L 4.1  Chloride 98 - 111 mmol/L 106  CO2 22 - 32 mmol/L 23  Glucose 70 - 99 mg/dL 897 (H)  BUN 6 - 20 mg/dL 17  Creatinine 9.55 - 8.99 mg/dL 9.23  Calcium 8.9 - 89.6 mg/dL 9.4  Anion gap 5 - 15  10  Alkaline Phosphatase 38 - 126 U/L 76  Albumin 3.5 - 5.0 g/dL 4.2  Lipase 11 - 51 U/L 30  AST 15 - 41 U/L 14 (L)  ALT 0 - 44 U/L 8  Total Protein 6.5 - 8.1 g/dL 7.0  Total Bilirubin 0.0 - 1.2 mg/dL 0.3  GFR, Estimated >39 mL/min >60     MRI brain 09/09/2019 Scattered foci of T2 hyperintensity are seen within the white matter of the cerebral hemispheres predominantly deep and juxtacortical white matter. No posterior fossa lesion identified. None of the lesions show enhancement after the intravenous administration of contrast.   Pituitary/Sella: The pituitary gland is normal in appearance and size for age without mass lesion. Mildly heterogeneous enhancement in the dynamic phase with homogeneous enhancement on the delayed is within normal limits. A normal posterior pituitary bright spot is seen. The  infundibulum is midline. The hypothalamus and mamillary bodies are normal. There is no mass effect on the optic chiasm or optic nerves. The infundibular and chiasmatic recesses are clear. Normal cavernous sinus and cavernous internal carotid artery flow voids.  ASSESSMENT / PLAN / RECOMMENDATIONS:    1.   Hypothyroidism   -Patient is clinically euthyroid -No local neck symptoms -She has been out of levothyroxine  for a week -TFTs today remain within normal range, no change    Medication  Continue levothyroxine  75 mcg daily    2.Hyperprolactinemia:     -Patient with chronic long-term history of intermittent hyperprolactinemia.This is attributed to multiple antipsychotic medications that have she has been on and off since her teenage years, that are known to cause hyperprolactinemia such as risperidone and paliperidone ETTER Linsey ) -Repeat prolactin levels have started to increase in 2022, requiring initiation of small dose of cabergoline  - MRI did NOT show any evidence of pituitary adenoma  -The patient has been off cabergoline  for a while.  She is currently on lithium  only -Prolactin level are within normal range      F/U in 4 months     Signed electronically by: Stefano Redgie Butts, MD  [facility] Endocrinology  [facility] [address] [place] [place], [place] [address] Phone: [phone] FAX: [phone]      CC: Justus Leita DEL, MD [address] [place] [address] Phone: [phone]  Fax: [phone]   Return to Endocrinology clinic as below: Future Appointments  Date Time Provider Department Center  06/26/2024 10:15 AM Silva, Juliene SAUNDERS, DPM [email or web address]    "

## 2024-06-07 ENCOUNTER — Ambulatory Visit: Payer: Self-pay | Admitting: Internal Medicine

## 2024-06-07 LAB — T4, FREE: Free T4: 1.1 ng/dL (ref 0.8–1.8)

## 2024-06-07 LAB — PROLACTIN: Prolactin: 15.4 ng/mL

## 2024-06-07 LAB — TSH: TSH: 1.15 m[IU]/L

## 2024-06-07 MED ORDER — LEVOTHYROXINE SODIUM 75 MCG PO TABS: 75.0000 ug | ORAL_TABLET | Freq: Every day | ORAL | 3 refills | Status: AC

## 2024-06-08 ENCOUNTER — Other Ambulatory Visit: Payer: Self-pay | Admitting: Podiatry

## 2024-06-26 ENCOUNTER — Encounter: Admitting: Podiatry

## 2024-10-04 ENCOUNTER — Ambulatory Visit: Admitting: Internal Medicine
